# Patient Record
Sex: Male | Born: 1955 | Race: White | Hispanic: No | Smoking: Former smoker
Health system: Southern US, Community
[De-identification: ages and names within clinical notes are randomized; demographics above are authoritative.]

## PROBLEM LIST (undated history)

## (undated) DIAGNOSIS — I509 Heart failure, unspecified: Secondary | ICD-10-CM

## (undated) DIAGNOSIS — E119 Type 2 diabetes mellitus without complications: Secondary | ICD-10-CM

## (undated) DIAGNOSIS — J449 Chronic obstructive pulmonary disease, unspecified: Secondary | ICD-10-CM

## (undated) DIAGNOSIS — F329 Major depressive disorder, single episode, unspecified: Secondary | ICD-10-CM

## (undated) DIAGNOSIS — I2510 Atherosclerotic heart disease of native coronary artery without angina pectoris: Secondary | ICD-10-CM

## (undated) DIAGNOSIS — I10 Essential (primary) hypertension: Secondary | ICD-10-CM

## (undated) DIAGNOSIS — F32A Depression, unspecified: Secondary | ICD-10-CM

## (undated) DIAGNOSIS — E7800 Pure hypercholesterolemia, unspecified: Secondary | ICD-10-CM

## (undated) DIAGNOSIS — Z9289 Personal history of other medical treatment: Secondary | ICD-10-CM

## (undated) DIAGNOSIS — Z95811 Presence of heart assist device: Secondary | ICD-10-CM

## (undated) DIAGNOSIS — IMO0001 Reserved for inherently not codable concepts without codable children: Secondary | ICD-10-CM

## (undated) DIAGNOSIS — F419 Anxiety disorder, unspecified: Secondary | ICD-10-CM

## (undated) DIAGNOSIS — I5022 Chronic systolic (congestive) heart failure: Secondary | ICD-10-CM

## (undated) DIAGNOSIS — F1411 Cocaine abuse, in remission: Secondary | ICD-10-CM

## (undated) DIAGNOSIS — Z950 Presence of cardiac pacemaker: Secondary | ICD-10-CM

## (undated) DIAGNOSIS — Z720 Tobacco use: Secondary | ICD-10-CM

## (undated) DIAGNOSIS — K922 Gastrointestinal hemorrhage, unspecified: Secondary | ICD-10-CM

## (undated) DIAGNOSIS — Z95810 Presence of automatic (implantable) cardiac defibrillator: Secondary | ICD-10-CM

## (undated) DIAGNOSIS — R45851 Suicidal ideations: Secondary | ICD-10-CM

## (undated) HISTORY — DX: Heart failure, unspecified (CMS HCC): I50.9

## (undated) HISTORY — PX: HX HERNIA REPAIR: SHX51

## (undated) HISTORY — DX: Chronic obstructive pulmonary disease, unspecified (CMS HCC): J44.9

## (undated) HISTORY — DX: Type 2 diabetes mellitus without complications (CMS HCC): E11.9

## (undated) HISTORY — PX: HX CORONARY ARTERY BYPASS GRAFT: SHX141

## (undated) HISTORY — PX: HX BACK SURGERY: SHX140

## (undated) HISTORY — PX: HX VASECTOMY: SHX75

## (undated) HISTORY — PX: INSERT / REPLACE / REMOVE PACEMAKER: SUR710

## (undated) HISTORY — PX: BACK SURGERY: SHX140

## (undated) HISTORY — DX: Major depressive disorder, single episode, unspecified: F32.9

## (undated) HISTORY — DX: Gastrointestinal hemorrhage, unspecified: K92.2

## (undated) HISTORY — DX: Depression, unspecified: F32.A

## (undated) HISTORY — DX: Suicidal ideations: R45.851

## (undated) HISTORY — DX: Atherosclerotic heart disease of native coronary artery without angina pectoris: I25.10

## (undated) HISTORY — PX: CARDIAC DEFIBRILLATOR PLACEMENT: SHX171

## (undated) HISTORY — DX: Essential (primary) hypertension: I10

## (undated) HISTORY — DX: Tobacco use: Z72.0

## (undated) HISTORY — DX: Chronic obstructive pulmonary disease, unspecified: J44.9

## (undated) HISTORY — DX: Cocaine abuse, in remission: F14.11

## (undated) HISTORY — PX: VASECTOMY: SHX75

---

## 1998-03-13 ENCOUNTER — Observation Stay (HOSPITAL_COMMUNITY): Admission: RE | Admit: 1998-03-13 | Discharge: 1998-03-14 | Payer: Self-pay | Admitting: Specialist

## 1999-02-02 HISTORY — PX: LUMBAR DISC SURGERY: SHX700

## 1999-10-03 ENCOUNTER — Inpatient Hospital Stay (HOSPITAL_COMMUNITY): Admission: EM | Admit: 1999-10-03 | Discharge: 1999-10-12 | Payer: Self-pay | Admitting: Cardiothoracic Surgery

## 1999-10-04 ENCOUNTER — Encounter: Payer: Self-pay | Admitting: Cardiothoracic Surgery

## 1999-10-05 HISTORY — PX: CORONARY ARTERY BYPASS GRAFT: SHX141

## 1999-10-06 ENCOUNTER — Encounter: Payer: Self-pay | Admitting: Cardiothoracic Surgery

## 1999-10-07 ENCOUNTER — Encounter: Payer: Self-pay | Admitting: Cardiothoracic Surgery

## 1999-10-08 ENCOUNTER — Encounter: Payer: Self-pay | Admitting: Cardiothoracic Surgery

## 1999-10-09 ENCOUNTER — Encounter: Payer: Self-pay | Admitting: Cardiothoracic Surgery

## 1999-10-31 ENCOUNTER — Emergency Department (HOSPITAL_COMMUNITY): Admission: EM | Admit: 1999-10-31 | Discharge: 1999-10-31 | Payer: Self-pay | Admitting: Emergency Medicine

## 1999-11-06 ENCOUNTER — Encounter: Admission: RE | Admit: 1999-11-06 | Discharge: 1999-11-06 | Payer: Self-pay | Admitting: Cardiothoracic Surgery

## 1999-11-06 ENCOUNTER — Encounter: Payer: Self-pay | Admitting: Cardiothoracic Surgery

## 1999-11-11 ENCOUNTER — Inpatient Hospital Stay (HOSPITAL_COMMUNITY): Admission: AD | Admit: 1999-11-11 | Discharge: 1999-11-17 | Payer: Self-pay | Admitting: Cardiothoracic Surgery

## 2005-02-07 ENCOUNTER — Emergency Department: Payer: Self-pay | Admitting: Emergency Medicine

## 2005-02-08 ENCOUNTER — Inpatient Hospital Stay: Payer: Self-pay | Admitting: Anesthesiology

## 2006-06-08 ENCOUNTER — Ambulatory Visit: Payer: Self-pay | Admitting: Internal Medicine

## 2006-06-29 IMAGING — CR DG CHEST 1V PORT
1 series · 1 of 1 positions shown · non-contrast
Comparison: none

REASON FOR EXAM: Infection
COMMENTS:

[view not recorded]
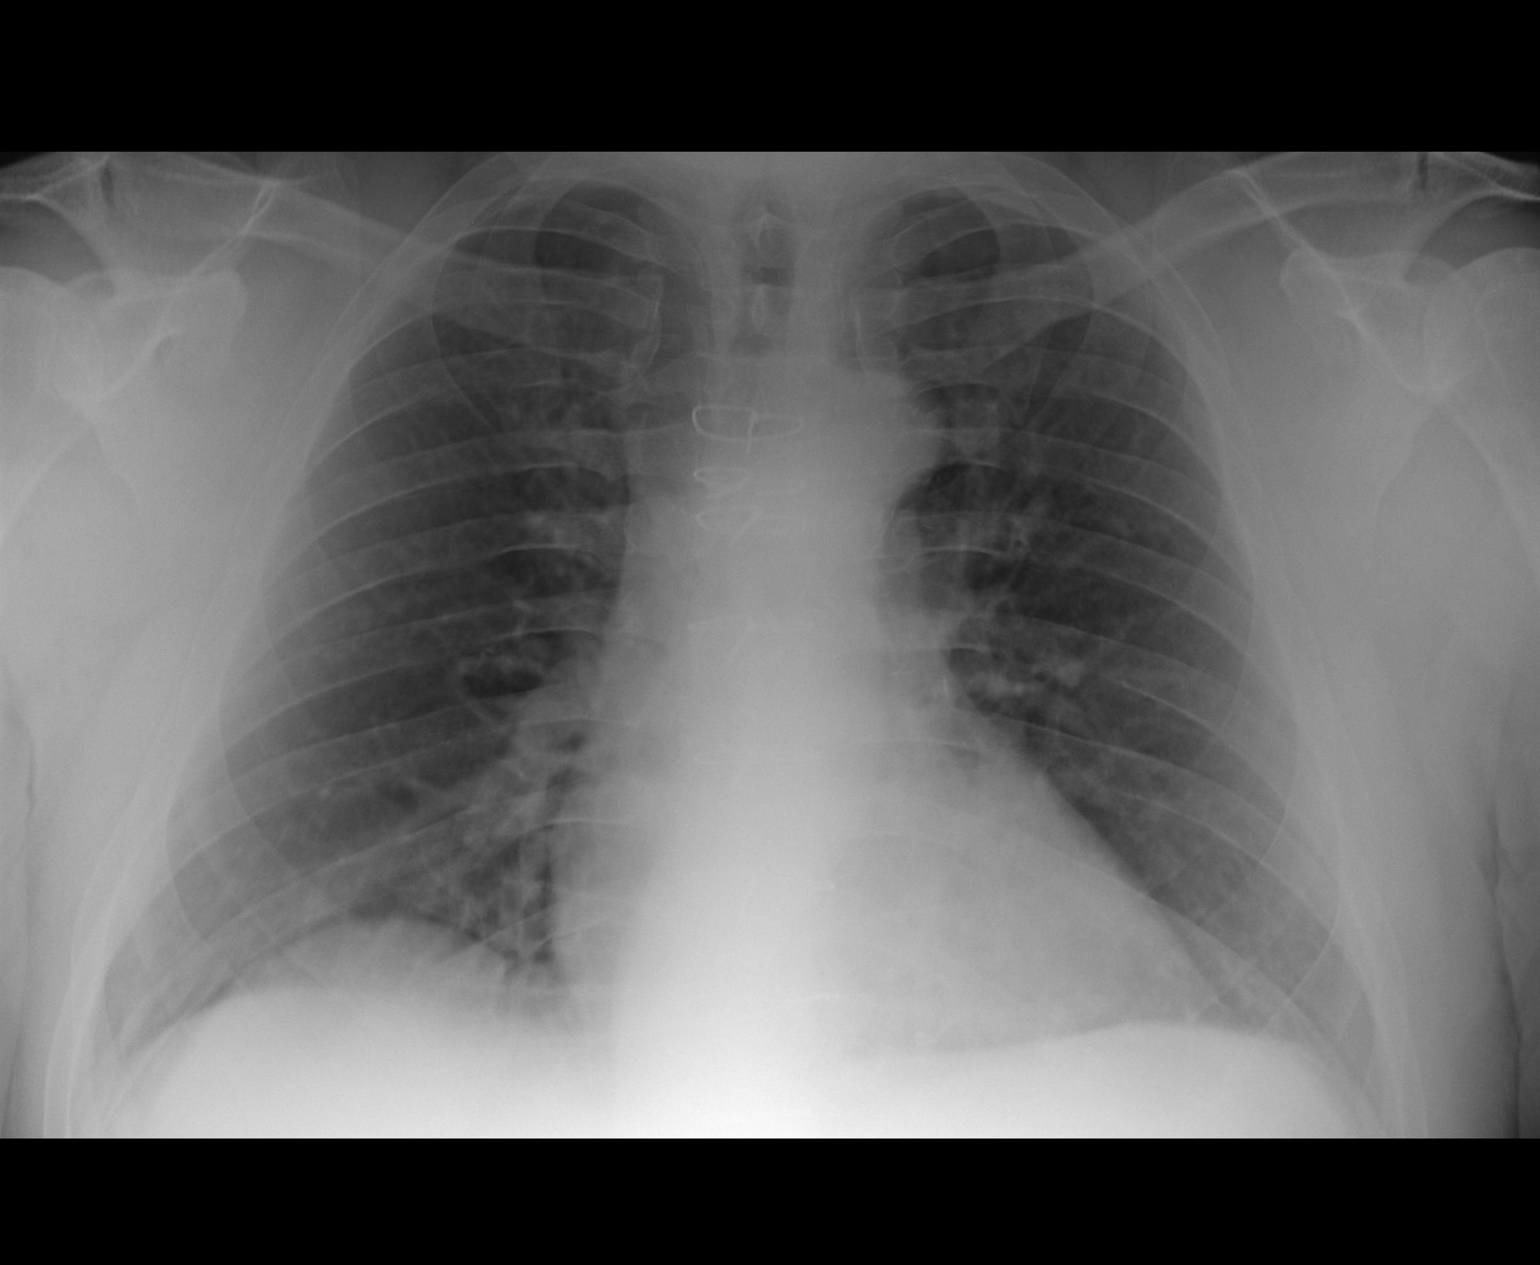

[1 of 1 positions shown; findings below may reference images not displayed]

PROCEDURE:     DXR - DXR PORTABLE CHEST SINGLE VIEW  - February 08, 2005  [DATE]

RESULT:     AP view of the chest is compared to a prior exam of 05/31/2000.

The lung fields remain clear. No pneumonia, pneumothorax or pleural effusion
is seen. The heart size is within normal limits. Post-operative changes of
prior CABG are again noted.
IMPRESSION: No acute changes are identified.

## 2006-06-29 IMAGING — US US EXTREM LOW VENOUS*R*
1 series · 17 of 24 positions shown · non-contrast
Comparison: none

REASON FOR EXAM: DVT
COMMENTS:

[Series 1: us extrem low venous*right* · 17 of 44 slices shown]
[im 1/44]
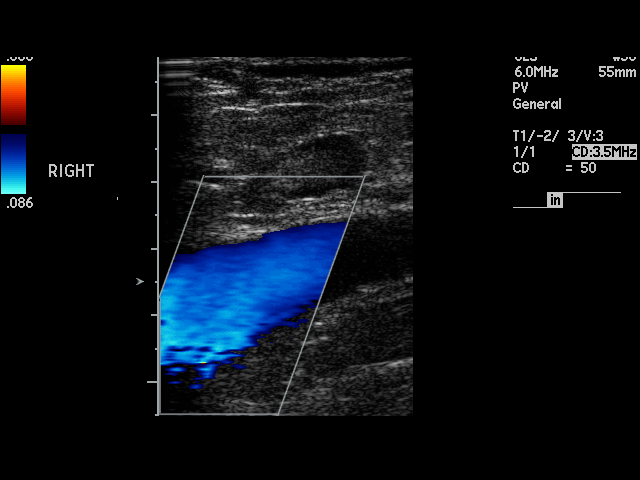
[im 4/44]
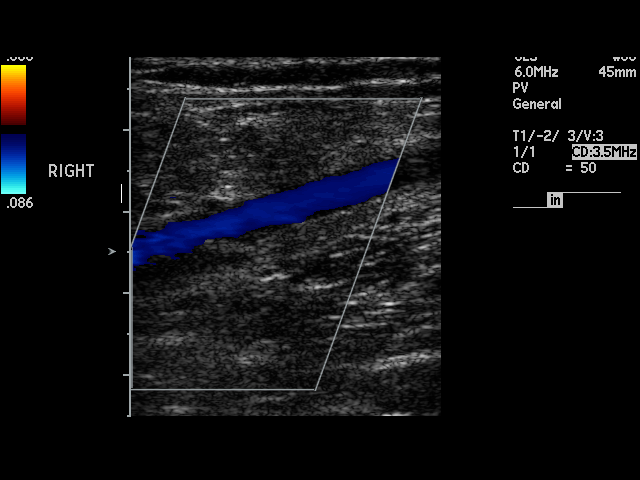
[im 6/44]
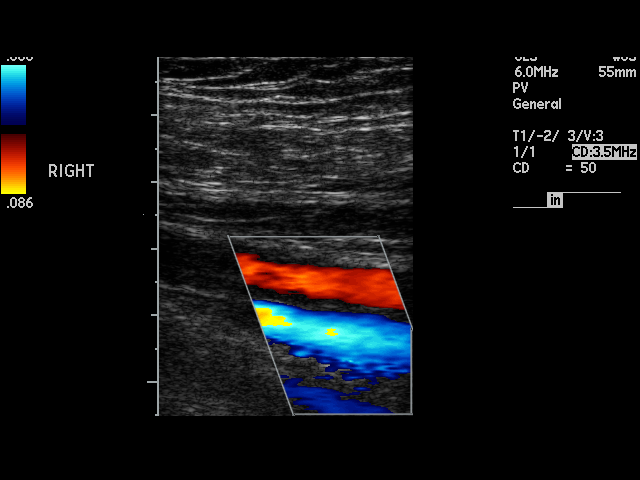
[im 8/44]
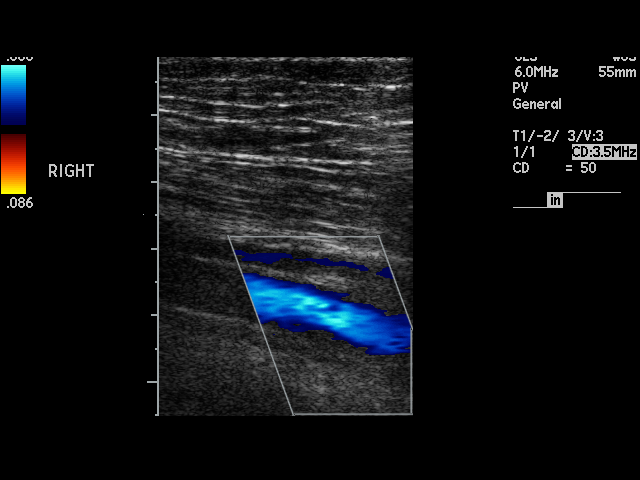
[im 12/44]
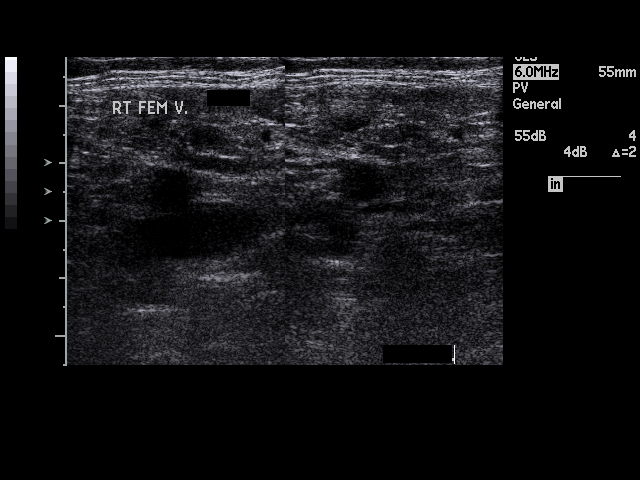
[im 14/44]
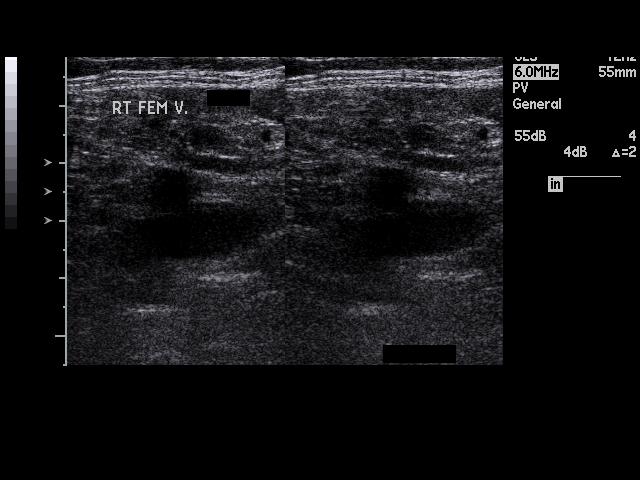
[im 17/44]
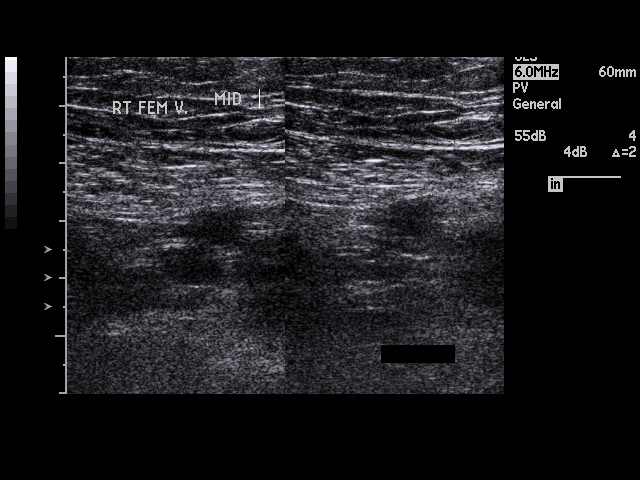
[im 19/44]
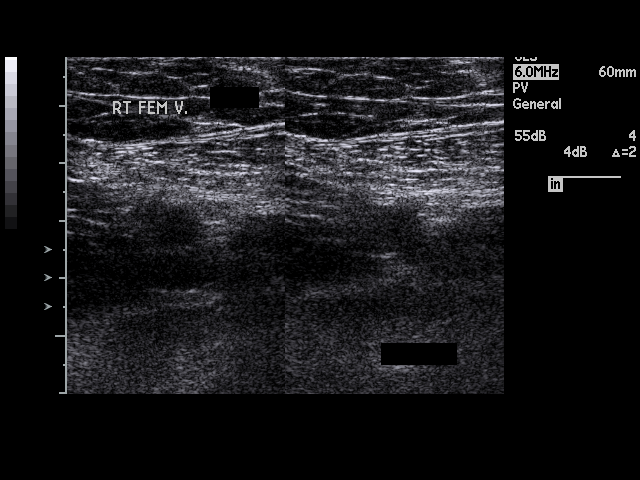
[im 23/44]
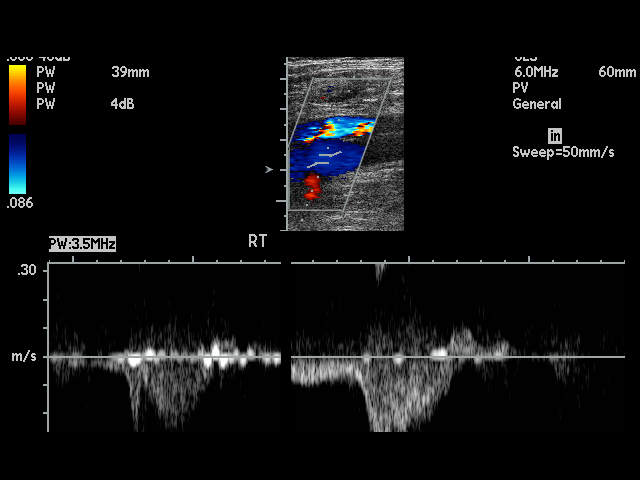
[im 25/44]
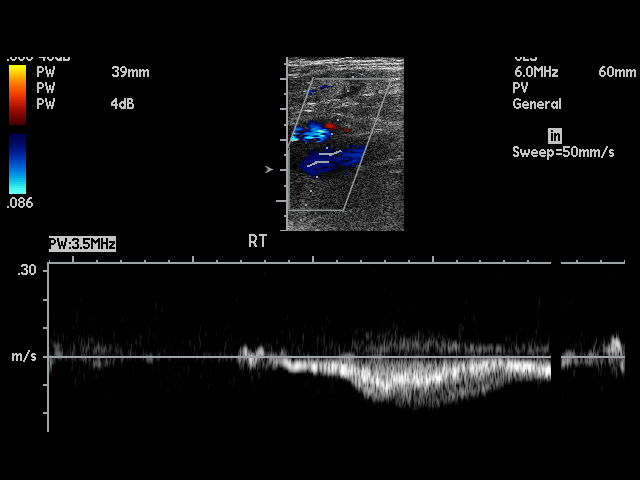
[im 27/44]
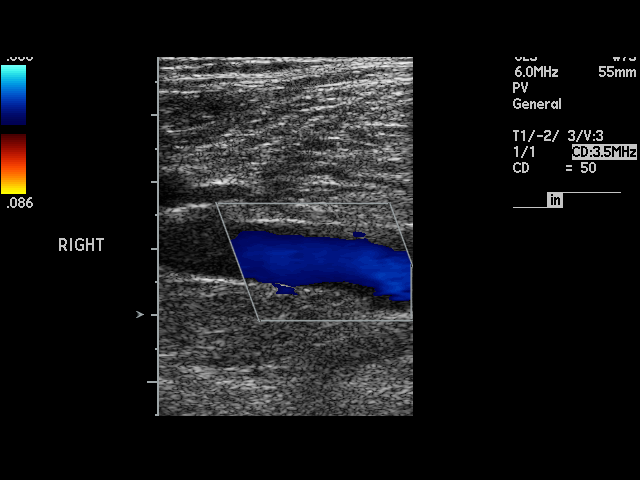
[im 30/44]
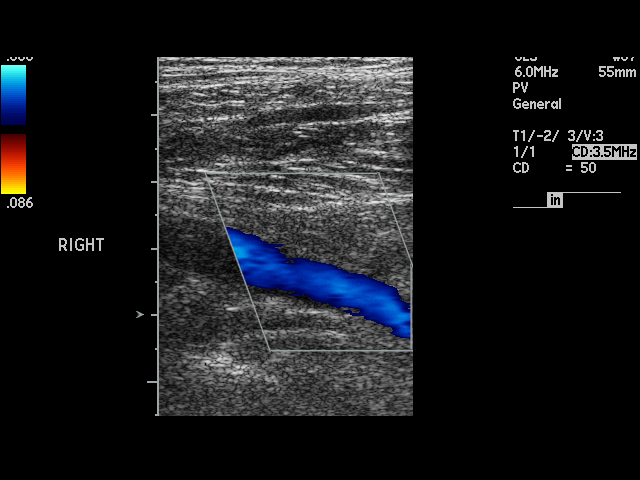
[im 32/44]
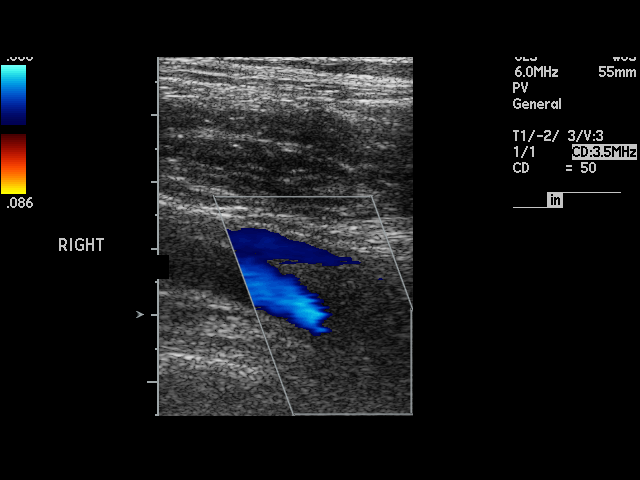
[im 36/44]
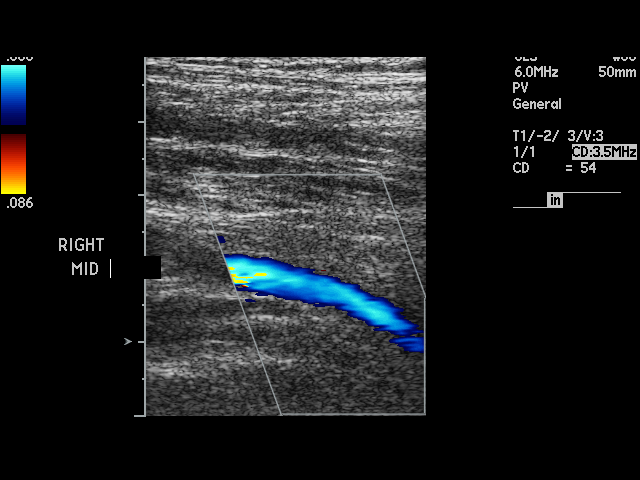
[im 38/44]
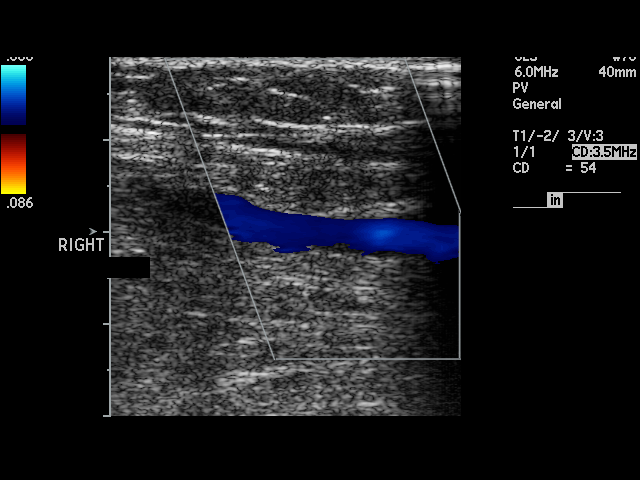
[im 40/44]
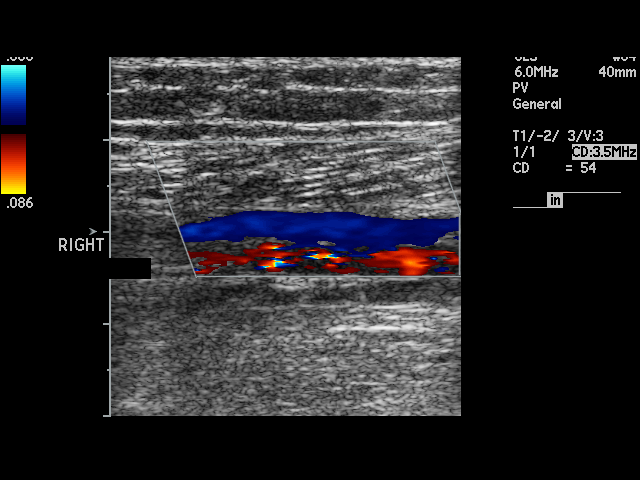
[im 44/44]
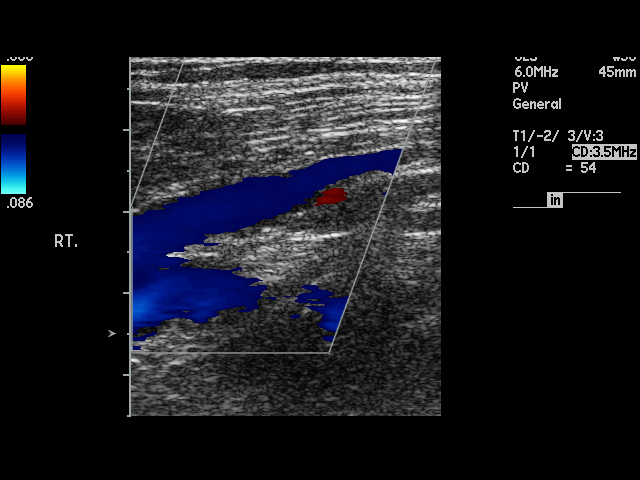

[17 of 24 positions shown; findings below may reference images not displayed]

PROCEDURE:     US  - US DOPPLER LOW EXTR RIGHT  - February 08, 2005  [DATE]

RESULT:     Color flow Doppler examination of the RIGHT femoral and
popliteal veins is performed.

The RIGHT femoral and popliteal veins are normally compressible. The
waveform patterns are normal and the color flow images are normal.
IMPRESSION: I see no evidence of thrombus within the RIGHT femoral or
popliteal veins.

## 2009-03-17 ENCOUNTER — Emergency Department: Payer: Self-pay | Admitting: Emergency Medicine

## 2009-06-04 ENCOUNTER — Inpatient Hospital Stay: Payer: Self-pay | Admitting: Internal Medicine

## 2009-06-05 ENCOUNTER — Inpatient Hospital Stay: Payer: Self-pay | Admitting: Specialist

## 2009-07-30 ENCOUNTER — Encounter (INDEPENDENT_AMBULATORY_CARE_PROVIDER_SITE_OTHER): Payer: Self-pay | Admitting: *Deleted

## 2009-07-30 ENCOUNTER — Encounter: Payer: Self-pay | Admitting: Cardiology

## 2009-07-30 LAB — CONVERTED CEMR LAB
ALT: 23 units/L
ALT: 23 units/L
AST: 16 units/L
Albumin: 4.4 g/dL
Alkaline Phosphatase: 62 units/L
BUN: 16 mg/dL
BUN: 16 mg/dL
CO2: 26 meq/L
Calcium: 9.3 mg/dL
Chloride: 101 meq/L
Chloride: 101 meq/L
Cholesterol: 179 mg/dL
Creatinine, Ser: 0.89 mg/dL
Creatinine, Ser: 0.89 mg/dL
Glucose, Bld: 263 mg/dL
Glucose, Bld: 263 mg/dL
HDL: 36 mg/dL
HDL: 36 mg/dL
Hgb A1c MFr Bld: 9.4 %
Hgb A1c MFr Bld: 9.4 %
LDL Cholesterol: 99 mg/dL
Potassium: 4.7 meq/L
Sodium: 139 meq/L
TSH: 1.166 microintl units/mL
TSH: 1.166 microintl units/mL
Total Protein: 7.3 g/dL
Triglycerides: 218 mg/dL
Triglycerides: 218 mg/dL

## 2009-08-06 ENCOUNTER — Encounter (INDEPENDENT_AMBULATORY_CARE_PROVIDER_SITE_OTHER): Payer: Self-pay | Admitting: *Deleted

## 2009-08-06 ENCOUNTER — Ambulatory Visit: Payer: Self-pay | Admitting: Cardiology

## 2009-08-06 DIAGNOSIS — M545 Low back pain, unspecified: Secondary | ICD-10-CM | POA: Insufficient documentation

## 2009-08-06 DIAGNOSIS — Z8659 Personal history of other mental and behavioral disorders: Secondary | ICD-10-CM

## 2009-08-06 DIAGNOSIS — I2510 Atherosclerotic heart disease of native coronary artery without angina pectoris: Secondary | ICD-10-CM | POA: Insufficient documentation

## 2009-08-06 DIAGNOSIS — Z9189 Other specified personal risk factors, not elsewhere classified: Secondary | ICD-10-CM | POA: Insufficient documentation

## 2009-08-06 DIAGNOSIS — E119 Type 2 diabetes mellitus without complications: Secondary | ICD-10-CM

## 2009-08-06 DIAGNOSIS — F17200 Nicotine dependence, unspecified, uncomplicated: Secondary | ICD-10-CM

## 2009-08-07 ENCOUNTER — Encounter: Payer: Self-pay | Admitting: Cardiology

## 2009-08-12 ENCOUNTER — Ambulatory Visit (HOSPITAL_COMMUNITY): Admission: RE | Admit: 2009-08-12 | Discharge: 2009-08-12 | Payer: Self-pay | Admitting: Cardiology

## 2009-08-12 ENCOUNTER — Ambulatory Visit: Payer: Self-pay | Admitting: Cardiology

## 2009-08-12 ENCOUNTER — Encounter: Payer: Self-pay | Admitting: Cardiology

## 2009-08-14 ENCOUNTER — Telehealth (INDEPENDENT_AMBULATORY_CARE_PROVIDER_SITE_OTHER): Payer: Self-pay

## 2009-08-15 ENCOUNTER — Telehealth (INDEPENDENT_AMBULATORY_CARE_PROVIDER_SITE_OTHER): Payer: Self-pay | Admitting: *Deleted

## 2009-10-09 ENCOUNTER — Encounter (INDEPENDENT_AMBULATORY_CARE_PROVIDER_SITE_OTHER): Payer: Self-pay | Admitting: *Deleted

## 2010-10-23 IMAGING — CR DG CHEST 1V PORT
1 series · 1 of 1 positions shown · non-contrast
Comparison: none

REASON FOR EXAM: cp
COMMENTS:

PROCEDURE:     DXR - DXR PORTABLE CHEST SINGLE VIEW  - June 04, 2009  [DATE]
RESULT:     The lungs are clear. The cardiac silhouette and visualized bony
skeleton are unremarkable.

[view not recorded]
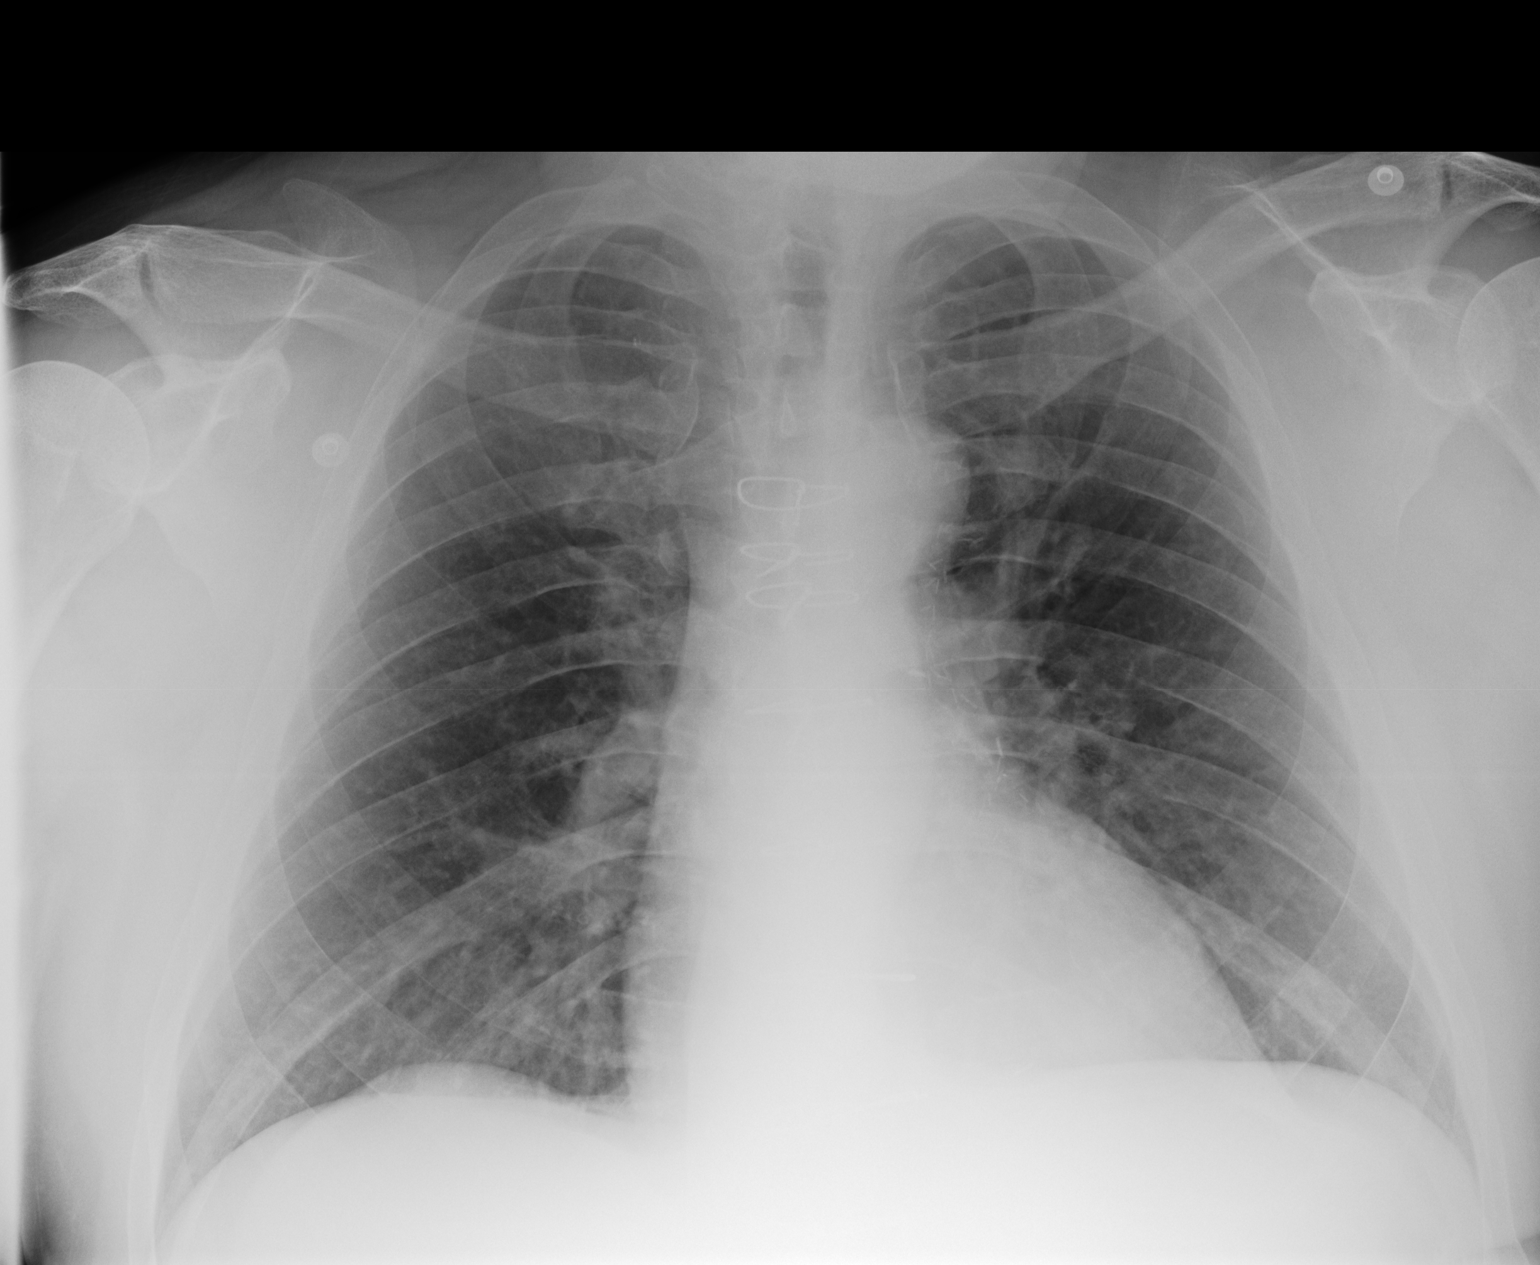

[1 of 1 positions shown; findings below may reference images not displayed]

IMPRESSION: 1. Chest radiograph without evidence of acute cardiopulmonary disease.
2. Patient status post median sternotomy coronary artery bypass grafting.

## 2010-11-03 NOTE — Miscellaneous (Signed)
Summary: LABS CMP,LIPIDS,TSH 07/30/2009  Clinical Lists Changes  Observations: Added new observation of CALCIUM: 9.3 mg/dL (16/07/9603 5:40) Added new observation of ALBUMIN: 4.4 g/dL (98/08/9146 8:29) Added new observation of PROTEIN, TOT: 7.3 g/dL (56/21/3086 5:78) Added new observation of SGPT (ALT): 23 units/L (07/30/2009 8:36) Added new observation of SGOT (AST): 16 units/L (07/30/2009 8:36) Added new observation of ALK PHOS: 62 units/L (07/30/2009 8:36) Added new observation of CREATININE: 0.89 mg/dL (46/96/2952 8:41) Added new observation of BUN: 16 mg/dL (32/44/0102 7:25) Added new observation of BG RANDOM: 263 mg/dL (36/64/4034 7:42) Added new observation of CO2 PLSM/SER: 26 meq/L (07/30/2009 8:36) Added new observation of CL SERUM: 101 meq/L (07/30/2009 8:36) Added new observation of K SERUM: 4.7 meq/L (07/30/2009 8:36) Added new observation of NA: 139 meq/L (07/30/2009 8:36) Added new observation of LDL: 99 mg/dL (59/56/3875 6:43) Added new observation of HDL: 36 mg/dL (32/95/1884 1:66) Added new observation of TRIGLYC TOT: 218 mg/dL (04/02/1600 0:93) Added new observation of CHOLESTEROL: 179 mg/dL (23/55/7322 0:25) Added new observation of TSH: 1.166 microintl units/mL (07/30/2009 8:36) Added new observation of HGBA1C: 9.4 % (07/30/2009 8:36)

## 2011-02-19 NOTE — H&P (Signed)
[place]. [facility]  Patient:    Ronald Miller, Ronald Miller                         MRN: [id number] Attending:  Mikey Bussing, M.D. Dictator:   Loura Pardon, P.A. CC:         Jillene Bucks. Drema Balzarine, M.D.             Arnoldo Hooker, M.D., Cardiology                         History and Physical  PRESENTING CIRCUMSTANCE: "My leg is infected."  HISTORY OF PRESENT ILLNESS:   This is a 55 year old male who underwent left heart catheterization 09/30/99 after experiencing three day history of chest pain, dyspnea, diaphoresis, and nausea.  Cardiac enzymes on admission to [facility] were elevated.  He subsequently underwent coronary artery bypass graft x 6 on 10/06/99 at [facility] by Dr. Kathlee Nations Trigt.  During his admission, his serum glucose was consistently elevated and he was covered with sliding scale insulin.  He was discharged 10/12/99 on Tenormin, Glucotrol XL, and  Cipro.  He has done well until about one and a half weeks ago.  At that time, he presented to the [facility] emergency room on a Saturday night complaining of swelling and tenderness at his right ankle saphenous venectomy harvest site.  He was seen the [facility] emergency room 10/31/99 by CVTS staff and started on p.o. Keflex.  Most recently, he presented to the ______ cardiovascular and thoracic surgeons in [place] on Friday, 11/07/99 and  saw Dr. Donata Clay, who instituted daily dressing changes at the right ankle, consisting of application of Neosporin and placement of sterile gauze.  His Keflex was continued.  Starting the very next day, Mr. Mazer felt swelling and tenderness just below the right knee at his saphenous venectomy incision there.  The pain nd swelling worsened with the beginning of this week such that he could hardly walk. He presented to the ______ cardiovascular and thoracic surgeons today.  He was seen there by Dr. Charlett Lango.   His incision just below the right knee as incised, purulent fluid was drained; this was cultured; the culture is positive for Staphylococcus aureus.  Mr. Stroschein is admitted now to [facility] for IV vancomycin therapy for Staphylococcus aureus positive abscess, as well as daily b.i.d. dressing changes.  ALLERGIES:  CODEINE.  MEDICATIONS: 1. Glucotrol XL 5 mg daily. 2. Enteric aspirin 325 mg daily. 3. Tenormin 25 mg daily. 4. Protonix 40 mg daily.  PAST MEDICAL HISTORY: 1. Atherosclerotic coronary artery disease. 2. History of herniated lumbar disc. 3. Type II diabetes mellitus. 4. Acute myocardial infarction 09/30/99.  PAST SURGICAL HISTORY: 1. Status post coronary artery bypass graft x 6, 10/06/99. 2. Status post back surgery for disc repair, 5/00. 3. Status post vasectomy/reversing vasectomy.  SOCIAL HISTORY:  He is divorced and has one daughter; he is in no close contact  with her.  He is a [employer].  He is a smoker, approximately one pack per day. He takes two to three beers a day.  FAMILY HISTORY:  He has two sisters and one brother, all in good health.  His mother is living, she is in good health.  Father is in good health. Grandfather  died of a myocardial infarction.  REVIEW OF SYSTEMS:  Pulmonary, cardiovascular  systems are stable per the patient. His neuro status is unremarkable and at baseline.  Abdomen:  He suffers from continued reflux and mild epigastric pain.  In the lower extremities, he complains of right lower extremity swelling and pain, which has increased in the last three days, making it very difficult to ambulate during this amount of time.  PHYSICAL EXAMINATION:  VITAL SIGNS:  Pending.  This is an obese gentleman in moderate distress from pain in the right lower extremity who underwent incision and drainage of abscess at the right knee earlier today.  His mental status is clear and he is able to answer  questions appropriately.  HEENT:  Eyes:  PERRL, EOMI.  Nares: Patent.  Sinuses clear.  Oropharynx:  He has native dentition in good repair.  Mucus membranes are pink and moist.  Neck:  Supple, no carotid bruits auscultated.  No jugular venous distention. No thyromegaly.  Chest:  Lungs clear to auscultation and percussion bilaterally.  Heart:  Regular rate and rhythm with a sinus rhythm on telemetry.  Abdomen:  Soft, obese, nondistended, bowel sounds are present.  He is not constipated.  He has no hepatosplenomegaly.  Extremities:  Right lower extremity:  Incision at the right ankle has broken down superficially with serous drainage.  At the right knee, there is erythema and swelling. The saphenous venectomy incision just below the knee has been incised  about 1/2 inch with serous drainage present.  The left lower extremity and the sternotomy incision are both healing well.  IMPRESSION: 1. Abscess, right lower extremity just below the knee, culture positive for  Staphylococcus aureus. 2. Status post coronary artery bypass graft x 6, 10/06/99. 3. History atherosclerotic coronary artery disease. 4. Type II diabetes mellitus. 5. History of acute myocardial infarction, 09/30/99.  PLAN:  Admit to [facility], place on IV vancomycin, start b.i.d. dressing changes to the abscess of the right knee, with daily dressing changes o the superficial wound at the right ankle.  Emphasize pain control, especially before dressing changes.    DD:  11/11/99 TD:  11/11/99 Job: [id number]

## 2011-02-19 NOTE — Op Note (Signed)
[place]. [facility]  Patient:    Ronald Miller                        MRN: [id number] Proc. Date: 10/06/99 Adm. Date:  [id number] Attending:  Mikey Bussing CC:         CVTS office             Arnoldo Hooker, M.D.             in [facility] in [place], [place].                           Operative Report  PREOPERATIVE DIAGNOSES: 1. Class IV unstable angina with recent non-Q-wave myocardial infarction. 2. Severe three vessel coronary artery disease.  POSTOPERATIVE DIAGNOSES: 1. Class IV unstable angina with recent non-Q-wave myocardial infarction. 2. Severe three vessel coronary artery disease.  OPERATION:  Coronary artery bypass grafting x 6 (left internal mammary artery to distal LAD, saphenous vein graft to first diagonal, saphenous vein graft to second diagonal, saphenous vein graft to obtuse marginal, sequential saphenous vein graft to right coronary artery and posterior descending).  SURGEON:  Mikey Bussing, M.D.  ASSISTANT:  Loura Pardon, P.A.  ANESTHESIA:  General by Dr. Cliffton Asters. Ivin Booty, M.D.  INDICATION:  The patient is a 55 year old diabetic smoker with obesity who presented with acute onset chest pain and ruled in for MI.  Cardiac catheterization by Dr. Arnoldo Hooker demonstrated severe three vessel disease with preserved left atrial function.  He was referred for coronary revascularization.  Prior to the operation, the patient was examined in his hospital room and is also the cardiac catheterization were reviewed with the patient.  The indications and expected benefits of the operation were discussed.  I reviewed the details of the operation including the placement of the surgical incisions, the use of cardiopulmonary bypass and general anesthesia, the choice of conduit, and the expected recovery.  I reviewed the risks of the operation including the risks of MI, CVA, bleeding, infection, and death.  I specifically  discussed the risks of  pulmonary complications due to his obesity and heavy smoking history.  He understood these implications for surgery and agreed to proceed with the operation as planned under informed consent.  FINDINGS:  The patients coronaries were diffusely diseased with multiple plaques in each vessel and were suboptimal target for grafting.  The saphenous vein was  below average in quality.  He would be a poor candidate for redo grafting.  The  mammary artery was placed very distally on the LAD due to his disease.  DESCRIPTION OF PROCEDURE:  The patient was brought to the operating room and placed supine on the operating table where general anesthesia was induced under invasive hemodynamic.  The chest, abdomen, and legs were prepped with Betadine and draped in a sterile field.  A median sternotomy was performed as the saphenous vein was harvested from the right lower extremity as well as the left lower extremity.  The internal mammary artery was harvested as a pedicle graft from its origin at the subclavian vessel and was a good vessel with excellent flow.  Heparin was administered systemically and the sternal retractor was placed.  The pericardium was opened and a purse-string was placed in the ascending aorta and right atrium.  The patient was cannulated and placed on cardiopulmonary bypass after the ECT was documented as  being therapeutic.  The coronaries were dissected out and the mammary artery and saphenous vein grafts were prepared for the distal anastomoses.  The patient was cooled to 20 degrees and the cardioplegia was placed.  Aortic cross clamp was applied.  One liter of cold blood cardioplegia was delivered to the aortic root with immediate cardioplegic  arrest and septal temperature dropping less than 12 degrees.  Topical ice saline slush used to augment myocardial preservation and a pericardial insulator pad was used to protect the left  phrenic nerve.  The distal coronary anastomoses were then performed.  The first distal anastomosis was to the first diagonal.  This was a diffusely diseased 1.4 mm vessel of proximal 95% stenosis.  The saphenous vein was sewn end-to-side with a running 7-0 Prolene. There was good flow through the graft.  The second distal anastomosis was the obtuse marginal.  This was a 1.5 mm vessel with diffuse disease and a proximal 90% stenosis.  A reverse saphenous vein was  sewn end-to-side with a running 7-0 Prolene.  The third distal anastomosis was to the second diagonal which was a 1.7 mm vessel, proximal 80% stenosis.  A reverse saphenous vein was sewn end-to-side with a running 7-0 Prolene with good flow through the graft.  The fourth and fifth distal anastomoses consistent of a sequential vein graft to the right coronary artery t the acute margin extending to the posterior descending of the inferior wall. The acute marginal right coronary was a 1.5 mm vessel with proximal tight 90% stenosis and a side-to-side anastomosis was performed using running 7-0 Prolene.  The fifth distal anastomosis was a continuation of the sequential vein graft to the posterior descending which had diffuse disease and a tight proximal 90% stenosis. The end of the vein was sewn end-to-side with a running 7-0 Prolene.  There was  good flow through the graft.  Cardioplegia was redosed.  The sixth distal anastomosis was to the distal LAD past a tight distal stenosis.  The LAD was 1.5 mm and the internal mammary artery pedicle was brought through an opening created in the left lateral pericardial. It was brought down on the LAD and sewn end-to-side with a running 8-0 Prolene. There was excellent flow through the anastomosis with immediate rise of septal temperature after release of the pedicle clamp of the mammary artery.  The mammary pedicle was secured to the epicardium.  The aortic cross clamp was  removed.  The heart was cardioverted back to a regular rhythm.  A partial occluding clamp was  placed on the ascending aorta and three proximal vein anastomoses were placed using 4.0 mm punch and running 6-0 Prolene.  The vein to the diagonal was fashioned as a wide configuration such that there was one proximal anastomosis for both distal  vein anastomoses.  All grafts were inspected and were hemostatic and there was ood flow through all the graft.  The patient was rewarmed to 37 degrees and temporary pacing wires were applied.  The patient reached 37 degrees.  He was weaned from cardiopulmonary bypass after the lungs re-expanded and then later resumed.  He was weaned off cardiopulmonary bypass easily without inotropic support with stable blood pressure and hemodynamics.  Protamine was administered and the cannula were removed.  The mediastinum was irrigated with warm antibiotic irrigation and the leg incisions  were irrigated and closed in a standard fashion.  The pericardium was loosely reapproximated.  Here two mediastinal and a left pleural chest tube were placed and brought out through  separate incisions.  The sternum was reapproximated with interrupted steel wire.  The pectoralis fascia and subcutaneous layers closed with running Vicryl.  The skin was closed with a subcuticular.  Sterile dressings were applied.  Total cardiopulmonary bypass time was 150 minutes with aortic cross clamp time f 75 minutes. DD:  10/06/99 TD:  10/07/99 Job: [id number]

## 2011-02-19 NOTE — Discharge Summary (Signed)
[place]. [facility]  Patient:    Ronald Miller                        MRN: [id number] Adm. Date:  [id number] Disc. Date: 10/12/99 Attending:  Mikey Bussing Dictator:   Lynnda Shields, P.A.-C. CC:         Mikey Bussing, M.D.             Lamar Blinks, M.D.             Lenard Galloway, M.D.                           Discharge Summary  ADMISSION DIAGNOSES: 1. Coronary artery disease, unstable angina pectoris. 2. Chronic pain syndrome. 3. History of herniated lumbar disk. 4. History of tobacco abuse. 5. Obesity.  PROCEDURES:  Coronary artery bypass grafting x 6, October 06, 1999.  HOSPITAL COURSE:  On October 03, 1999, the patient was admitted to [facility] with a chief complaint of "I had a heart attack."  This is a 55 year old male with no prior cardiac problems who presented to [facility] December 26 with a three-day history of off and on chest pain.  He was admitted to [facility] and was started on a heparin drip.  He underwent a left heart catheterization on December 27 which revealed severe three-vessel coronary artery disease.  He was transferred to [facility] for possible surgical correction of is coronary artery disease.  On October 03, 1999, the patient was seen by Loura Pardon, CVTS P.A., and Dr. Kerin Perna, attending physician.  Due to his new onset angina with non-Q-wave MI and catheterization revealing severe three-vessel coronary artery disease, surgical  intervention was recommended.  Surgical versus medical management of the problem was discussed with the patient, and the patient opted for surgery.  The surgery was scheduled for October 06, 1999.  The patient remained stable during the preoperative period.  On October 06, 1999, the patient was taken to the operating room by Dr. Donata Clay. A coronary artery bypass grafting was carried out.  Six vessels were bypassed. The LIMA was  anastomosed to the LAD; saphenous vein graft was anastomosed to the left circumflex; a sequential saphenous vein graft was anastomosed to the right coronary artery and the PDA, and another sequential saphenous vein graft was anastomosed  from the first diagonal to the second diagonal.  There were no intraoperative complications reported.  Cross clamp time was 77 minutes.  Pump time was 490 minutes.  The patient was transferred postoperatively to the surgical intensive care unit in stable condition.  On the evening of surgery, the patient was extubated and afebrile.  Cardiac index was good at 2.2.  Pulmonary artery pressure was good at 29/19.  Chest tube output was low.  Urine output was good.  Hematocrit was 33.  Potassium was 3.9.  On postoperative day #1, the cardiac index was greater than 3.  EKG showed right bundle branch block without sequelae.  O2 saturation was 94% on 2 liters a minute O2.  Labs were as follows.  White blood cell count 13,000, hematocrit 35%, platelet count 149,000.  Potassium 4.9, creatinine 1.0, glucose 271.  The patient was in  stable condition, but it was determined to keep him in the surgical intensive care unit for continued pulmonary care.  On postoperative day #2, the patient was  in normal sinus rhythm with a blood pressure of 110/70.  O2 saturations were 92% on 2 liters a minute by nasal cannula. Chest x-ray showed mild pulmonary edema.  Labs were as follows.  Hematocrit 31%, white blood cell count 11,400, platelet count 130,000.  Creatinine 0.7.  on postoperative day #3, the patients temperature went up to 100.2.  His vital signs were otherwise stable.  He white blood count was 10,700.  His hemoglobin nd hematocrit were 11.6 and 32.7%, respectively.  Platelet count was 171,000. Serum electrolyte studies showed sodium 135, potassium 4.6, chloride 97, CO2 33, BUN 5, creatinine 0.9, glucose 138.  The patient was having a lot of difficulty  with pulmonary toilet due to incisional pain.  Despite ______  and assistance from both physician assistants, nursing staff, and physicians, we could not get the patient to cough adequately or use incentive spirometer.  He developed some green sputum. His pain medicines were increased, and he was encouraged to aggressively institute coughing and deep breathing as well as use his incentive spirometer q.2h.  Over the next several days, the patient progressed fairly.  He continued to be difficult to motivate to ambulate and use adequate pulmonary toilet measures.  Despite this, the patient did quite well.  On October 11, 1999, the patient continued to have a little bit of fever which is undoubtedly due to his decreased mobility and atelectasis.  He was started on Humibid on October 10, 1999, and this seemed to increased the amount of productivity of his cough while decreasing the amount of effort required to loosen the respiratory secretions.  He vital signs were otherwise stable.   He did have some coarse rales in both bases of his lungs, but they were much improved from the previous day.  His incisions were stable.  Again, it was emphasized to the patient the importance of adequate pulmonary toilet utilization.  Dr. Dorris Fetch examined the patient also and determined that he would probably be stable for discharge he following day.  DISCHARGE DIAGNOSES: 1. Severe three vessel coronary artery disease, status post coronary artery bypass    grafting x 6. 2. Chronic pain syndrome. 3. History of herniated lumbar disks. 4. History of tobacco abuse. 5. Moderate to severe postoperative atelectasis. 6. Bronchitis. 7. Obesity.  DISCHARGE MEDICATIONS: 1. Albuterol MDI 4 puffs q.i.d. 2. Humibid LA 600 mg 2 p.o. q.12h. 3. OxyContin 20 mg 1 p.o. b.i.d. 4. Cipro 500 mg 1 q.12h. x 5 additional days. 5. Protonix 40 mg 1 q.d. 6. Tenormin 25 mg 1/2 tablet p.o. b.i.d. 7. Glucotrol XL 5  mg 1 p.o. q.d. 8. Enteric-coated ASA 325 mg 1 p.o. q.d.   DISCHARGE AND FOLLOWUP INSTRUCTIONS:  All medications, activity level, diet, and wound care will be discussed in detail with the patient before discharge.  His medications and their dosing will be reviewed with him in detail.  1) The patient is being advised to see Dr. Arnoldo Hooker in followup in two weeks.  He has been advised he will need to call to arrange this appointment.  2) The patient will eed to see Dr. Donata Clay in three weeks.  Our office will call him with the date and time. 3) The patient has an appointment at the [facility] diabetes management center on November 04, 1999, at 8:45 a.m.  The patient is discharged home in stable condition. DD:  10/11/99 TD:  10/11/99 Job: [id number]

## 2011-09-29 ENCOUNTER — Encounter: Payer: Self-pay | Admitting: Cardiology

## 2012-09-06 ENCOUNTER — Inpatient Hospital Stay: Payer: Self-pay | Admitting: Internal Medicine

## 2012-09-06 LAB — URINALYSIS, COMPLETE
Bacteria: NONE SEEN
Bilirubin,UR: NEGATIVE
Blood: NEGATIVE
Protein: 100
RBC,UR: 1 /HPF (ref 0–5)
Specific Gravity: 1.031 (ref 1.003–1.030)
Squamous Epithelial: 1
WBC UR: 3 /HPF (ref 0–5)

## 2012-09-06 LAB — COMPREHENSIVE METABOLIC PANEL
Alkaline Phosphatase: 64 U/L (ref 50–136)
Anion Gap: 8 (ref 7–16)
Bilirubin,Total: 0.8 mg/dL (ref 0.2–1.0)
Calcium, Total: 8.6 mg/dL (ref 8.5–10.1)
Chloride: 104 mmol/L (ref 98–107)
Co2: 25 mmol/L (ref 21–32)
Creatinine: 0.82 mg/dL (ref 0.60–1.30)
EGFR (African American): >60
EGFR (Non-African Amer.): >60
SGOT(AST): 34 U/L (ref 15–37)
SGPT (ALT): 69 U/L (ref 12–78)

## 2012-09-06 LAB — TROPONIN I
Troponin-I: 0.53 ng/mL — ABNORMAL HIGH
Troponin-I: 0.64 ng/mL — ABNORMAL HIGH

## 2012-09-06 LAB — APTT: Activated PTT: 30.9 secs (ref 23.6–35.9)

## 2012-09-06 LAB — CBC
MCHC: 35.1 g/dL (ref 32.0–36.0)
MCV: 91 fL (ref 80–100)
Platelet: 141 10*3/uL — ABNORMAL LOW (ref 150–440)
RDW: 13.9 % (ref 11.5–14.5)
WBC: 8.8 10*3/uL (ref 3.8–10.6)

## 2012-09-06 LAB — DRUG SCREEN, URINE
Amphetamines, Ur Screen: NEGATIVE (ref ?–1000)
Benzodiazepine, Ur Scrn: NEGATIVE (ref ?–200)
MDMA (Ecstasy)Ur Screen: NEGATIVE (ref ?–500)
Methadone, Ur Screen: NEGATIVE (ref ?–300)
Tricyclic, Ur Screen: NEGATIVE (ref ?–1000)

## 2012-09-06 LAB — CK TOTAL AND CKMB (NOT AT ARMC): CK, Total: 252 U/L — ABNORMAL HIGH (ref 35–232)

## 2012-09-06 LAB — PROTIME-INR
INR: 1
Prothrombin Time: 13.7 secs (ref 11.5–14.7)

## 2012-09-06 LAB — ACETAMINOPHEN LEVEL: Acetaminophen: <2 ug/mL

## 2012-09-06 LAB — ETHANOL: Ethanol %: <0.003 % (ref 0.000–0.080)

## 2012-09-07 LAB — CBC WITH DIFFERENTIAL/PLATELET
Basophil %: 0.4 %
HCT: 39.5 % — ABNORMAL LOW (ref 40.0–52.0)
HGB: 13.4 g/dL (ref 13.0–18.0)
Lymphocyte #: 2.1 10*3/uL (ref 1.0–3.6)
MCH: 31 pg (ref 26.0–34.0)
MCHC: 34 g/dL (ref 32.0–36.0)
MCV: 91 fL (ref 80–100)
Monocyte #: 0.4 x10 3/mm (ref 0.2–1.0)
Monocyte %: 8 %
Neutrophil #: 2.6 10*3/uL (ref 1.4–6.5)
Neutrophil %: 49.6 %
WBC: 5.2 10*3/uL (ref 3.8–10.6)

## 2012-09-07 LAB — BASIC METABOLIC PANEL
Anion Gap: 5 — ABNORMAL LOW (ref 7–16)
Chloride: 103 mmol/L (ref 98–107)
EGFR (Non-African Amer.): >60
Osmolality: 284 (ref 275–301)
Sodium: 135 mmol/L — ABNORMAL LOW (ref 136–145)

## 2012-09-07 LAB — DRUG SCREEN, URINE
Barbiturates, Ur Screen: NEGATIVE (ref ?–200)
Cocaine Metabolite,Ur ~~LOC~~: POSITIVE (ref ?–300)
MDMA (Ecstasy)Ur Screen: NEGATIVE (ref ?–500)
Opiate, Ur Screen: NEGATIVE (ref ?–300)
Phencyclidine (PCP) Ur S: NEGATIVE (ref ?–25)
Tricyclic, Ur Screen: NEGATIVE (ref ?–1000)

## 2012-09-07 LAB — HEMOGLOBIN A1C: Hemoglobin A1C: 8.4 % — ABNORMAL HIGH (ref 4.2–6.3)

## 2012-09-07 LAB — LIPID PANEL
Cholesterol: 124 mg/dL (ref 0–200)
HDL Cholesterol: 23 mg/dL — ABNORMAL LOW (ref 40–60)
Ldl Cholesterol, Calc: 50 mg/dL (ref 0–100)
Triglycerides: 253 mg/dL — ABNORMAL HIGH (ref 0–200)
VLDL Cholesterol, Calc: 51 mg/dL — ABNORMAL HIGH (ref 5–40)

## 2012-09-07 LAB — TROPONIN I: Troponin-I: 0.42 ng/mL — ABNORMAL HIGH

## 2012-09-08 LAB — CK TOTAL AND CKMB (NOT AT ARMC)
CK, Total: 79 U/L (ref 35–232)
CK-MB: 2.2 ng/mL (ref 0.5–3.6)

## 2012-09-08 LAB — TROPONIN I: Troponin-I: 0.27 ng/mL — ABNORMAL HIGH

## 2012-12-25 ENCOUNTER — Emergency Department: Payer: Self-pay | Admitting: Unknown Physician Specialty

## 2013-04-06 ENCOUNTER — Emergency Department: Payer: Self-pay | Admitting: Emergency Medicine

## 2013-04-08 ENCOUNTER — Emergency Department: Payer: Self-pay | Admitting: Emergency Medicine

## 2013-05-22 ENCOUNTER — Emergency Department: Payer: Self-pay | Admitting: Internal Medicine

## 2013-08-14 ENCOUNTER — Inpatient Hospital Stay: Payer: Self-pay | Admitting: Internal Medicine

## 2013-08-14 DIAGNOSIS — I059 Rheumatic mitral valve disease, unspecified: Secondary | ICD-10-CM

## 2013-08-14 LAB — URINALYSIS, COMPLETE
Bilirubin,UR: NEGATIVE
Ketone: NEGATIVE
Nitrite: NEGATIVE
RBC,UR: <1 /HPF (ref 0–5)
Specific Gravity: 1.012 (ref 1.003–1.030)
WBC UR: 1 /HPF (ref 0–5)

## 2013-08-14 LAB — CBC
HCT: 43.9 % (ref 40.0–52.0)
HGB: 14.9 g/dL (ref 13.0–18.0)
MCH: 30.6 pg (ref 26.0–34.0)
MCHC: 34.1 g/dL (ref 32.0–36.0)
Platelet: 146 10*3/uL — ABNORMAL LOW (ref 150–440)
RDW: 13.8 % (ref 11.5–14.5)
WBC: 8.1 10*3/uL (ref 3.8–10.6)

## 2013-08-14 LAB — COMPREHENSIVE METABOLIC PANEL
Albumin: 3.7 g/dL (ref 3.4–5.0)
Alkaline Phosphatase: 87 U/L (ref 50–136)
Anion Gap: 4 — ABNORMAL LOW (ref 7–16)
BUN: 11 mg/dL (ref 7–18)
Bilirubin,Total: 0.5 mg/dL (ref 0.2–1.0)
Calcium, Total: 8.7 mg/dL (ref 8.5–10.1)
Creatinine: 0.86 mg/dL (ref 0.60–1.30)
EGFR (Non-African Amer.): >60
Glucose: 285 mg/dL — ABNORMAL HIGH (ref 65–99)
Potassium: 4.1 mmol/L (ref 3.5–5.1)
SGOT(AST): 23 U/L (ref 15–37)
Sodium: 137 mmol/L (ref 136–145)
Total Protein: 7.1 g/dL (ref 6.4–8.2)

## 2013-08-14 LAB — TROPONIN I
Troponin-I: <0.02 ng/mL
Troponin-I: <0.02 ng/mL
Troponin-I: <0.02 ng/mL

## 2013-08-14 LAB — DRUG SCREEN, URINE
Amphetamines, Ur Screen: NEGATIVE (ref ?–1000)
Benzodiazepine, Ur Scrn: NEGATIVE (ref ?–200)
Cannabinoid 50 Ng, Ur ~~LOC~~: NEGATIVE (ref ?–50)
Cocaine Metabolite,Ur ~~LOC~~: POSITIVE (ref ?–300)
Methadone, Ur Screen: NEGATIVE (ref ?–300)
Opiate, Ur Screen: NEGATIVE (ref ?–300)
Tricyclic, Ur Screen: NEGATIVE (ref ?–1000)

## 2013-08-14 LAB — CK-MB
CK-MB: 2.7 ng/mL (ref 0.5–3.6)
CK-MB: 2.5 ng/mL (ref 0.5–3.6)

## 2013-08-15 LAB — COMPREHENSIVE METABOLIC PANEL
Albumin: 3.5 g/dL (ref 3.4–5.0)
Bilirubin,Total: 0.4 mg/dL (ref 0.2–1.0)
Chloride: 98 mmol/L (ref 98–107)
Co2: 30 mmol/L (ref 21–32)
Creatinine: 1.07 mg/dL (ref 0.60–1.30)
EGFR (Non-African Amer.): >60
Glucose: 311 mg/dL — ABNORMAL HIGH (ref 65–99)
Osmolality: 283 (ref 275–301)
Potassium: 3.6 mmol/L (ref 3.5–5.1)
SGPT (ALT): 42 U/L (ref 12–78)
Sodium: 135 mmol/L — ABNORMAL LOW (ref 136–145)

## 2013-08-15 LAB — CBC WITH DIFFERENTIAL/PLATELET
Basophil #: 0 10*3/uL (ref 0.0–0.1)
Eosinophil #: 0.1 10*3/uL (ref 0.0–0.7)
HCT: 42.9 % (ref 40.0–52.0)
HGB: 14.8 g/dL (ref 13.0–18.0)
Lymphocyte #: 1.9 10*3/uL (ref 1.0–3.6)
Lymphocyte %: 19.6 %
MCHC: 34.5 g/dL (ref 32.0–36.0)
MCV: 89 fL (ref 80–100)
Monocyte %: 4.4 %
Neutrophil #: 7.4 10*3/uL — ABNORMAL HIGH (ref 1.4–6.5)
Neutrophil %: 75.2 %
Platelet: 157 10*3/uL (ref 150–440)
RBC: 4.82 10*6/uL (ref 4.40–5.90)
RDW: 13.3 % (ref 11.5–14.5)
WBC: 9.8 10*3/uL (ref 3.8–10.6)

## 2013-08-15 LAB — LIPID PANEL
Cholesterol: 153 mg/dL (ref 0–200)
Ldl Cholesterol, Calc: 89 mg/dL (ref 0–100)
VLDL Cholesterol, Calc: 27 mg/dL (ref 5–40)

## 2013-08-15 LAB — PRO B NATRIURETIC PEPTIDE: B-Type Natriuretic Peptide: 2528 pg/mL — ABNORMAL HIGH (ref 0–125)

## 2013-08-15 LAB — TSH: Thyroid Stimulating Horm: 0.454 u[IU]/mL

## 2013-08-15 LAB — HEMOGLOBIN A1C: Hemoglobin A1C: 10.8 % — ABNORMAL HIGH (ref 4.2–6.3)

## 2014-01-25 ENCOUNTER — Emergency Department: Payer: Self-pay | Admitting: Emergency Medicine

## 2014-01-25 LAB — CBC
HCT: 45.3 % (ref 40.0–52.0)
HGB: 14.8 g/dL (ref 13.0–18.0)
MCH: 30.3 pg (ref 26.0–34.0)
MCHC: 32.8 g/dL (ref 32.0–36.0)
MCV: 93 fL (ref 80–100)
Platelet: 130 10*3/uL — ABNORMAL LOW (ref 150–440)
RBC: 4.89 10*6/uL (ref 4.40–5.90)
RDW: 13.5 % (ref 11.5–14.5)
WBC: 8.1 10*3/uL (ref 3.8–10.6)

## 2014-01-25 LAB — BASIC METABOLIC PANEL
Anion Gap: 2 — ABNORMAL LOW (ref 7–16)
BUN: 14 mg/dL (ref 7–18)
Calcium, Total: 9.1 mg/dL (ref 8.5–10.1)
Chloride: 103 mmol/L (ref 98–107)
Co2: 31 mmol/L (ref 21–32)
Creatinine: 1.02 mg/dL (ref 0.60–1.30)
EGFR (African American): >60
EGFR (Non-African Amer.): >60
Glucose: 229 mg/dL — ABNORMAL HIGH (ref 65–99)
Osmolality: 280 (ref 275–301)
Potassium: 4.5 mmol/L (ref 3.5–5.1)
SODIUM: 136 mmol/L (ref 136–145)

## 2014-01-25 LAB — TROPONIN I: Troponin-I: <0.02 ng/mL

## 2014-01-25 LAB — PRO B NATRIURETIC PEPTIDE: B-Type Natriuretic Peptide: 2121 pg/mL — ABNORMAL HIGH (ref 0–125)

## 2014-03-05 DIAGNOSIS — I2510 Atherosclerotic heart disease of native coronary artery without angina pectoris: Secondary | ICD-10-CM | POA: Insufficient documentation

## 2014-03-05 DIAGNOSIS — IMO0001 Reserved for inherently not codable concepts without codable children: Secondary | ICD-10-CM | POA: Insufficient documentation

## 2014-03-05 DIAGNOSIS — Z794 Long term (current) use of insulin: Secondary | ICD-10-CM

## 2014-03-05 DIAGNOSIS — E119 Type 2 diabetes mellitus without complications: Secondary | ICD-10-CM

## 2014-03-05 DIAGNOSIS — E785 Hyperlipidemia, unspecified: Secondary | ICD-10-CM | POA: Insufficient documentation

## 2014-03-26 ENCOUNTER — Emergency Department: Payer: Self-pay | Admitting: Emergency Medicine

## 2014-03-26 LAB — DRUG SCREEN, URINE
AMPHETAMINES, UR SCREEN: NEGATIVE (ref ?–1000)
Barbiturates, Ur Screen: NEGATIVE (ref ?–200)
Benzodiazepine, Ur Scrn: NEGATIVE (ref ?–200)
COCAINE METABOLITE, UR ~~LOC~~: POSITIVE (ref ?–300)
Cannabinoid 50 Ng, Ur ~~LOC~~: NEGATIVE (ref ?–50)
MDMA (ECSTASY) UR SCREEN: NEGATIVE (ref ?–500)
Methadone, Ur Screen: NEGATIVE (ref ?–300)
OPIATE, UR SCREEN: NEGATIVE (ref ?–300)
Phencyclidine (PCP) Ur S: NEGATIVE (ref ?–25)
Tricyclic, Ur Screen: NEGATIVE (ref ?–1000)

## 2014-03-26 LAB — CBC
HCT: 42.4 % (ref 40.0–52.0)
HGB: 13.8 g/dL (ref 13.0–18.0)
MCH: 29.8 pg (ref 26.0–34.0)
MCHC: 32.5 g/dL (ref 32.0–36.0)
MCV: 92 fL (ref 80–100)
PLATELETS: 135 10*3/uL — AB (ref 150–440)
RBC: 4.62 10*6/uL (ref 4.40–5.90)
RDW: 13.8 % (ref 11.5–14.5)
WBC: 9.8 10*3/uL (ref 3.8–10.6)

## 2014-03-26 LAB — TROPONIN I
Troponin-I: <0.02 ng/mL
Troponin-I: <0.02 ng/mL

## 2014-03-26 LAB — COMPREHENSIVE METABOLIC PANEL
ALK PHOS: 65 U/L
AST: 39 U/L — AB (ref 15–37)
Albumin: 3.6 g/dL (ref 3.4–5.0)
Anion Gap: 7 (ref 7–16)
BUN: 26 mg/dL — ABNORMAL HIGH (ref 7–18)
Bilirubin,Total: 0.3 mg/dL (ref 0.2–1.0)
CREATININE: 0.92 mg/dL (ref 0.60–1.30)
Calcium, Total: 8.7 mg/dL (ref 8.5–10.1)
Chloride: 104 mmol/L (ref 98–107)
Co2: 28 mmol/L (ref 21–32)
EGFR (Non-African Amer.): >60
GLUCOSE: 128 mg/dL — AB (ref 65–99)
OSMOLALITY: 284 (ref 275–301)
Potassium: 4.2 mmol/L (ref 3.5–5.1)
SGPT (ALT): 49 U/L (ref 12–78)
Sodium: 139 mmol/L (ref 136–145)
Total Protein: 7 g/dL (ref 6.4–8.2)

## 2014-03-26 LAB — URINALYSIS, COMPLETE
BACTERIA: NONE SEEN
BLOOD: NEGATIVE
Bilirubin,UR: NEGATIVE
Glucose,UR: NEGATIVE mg/dL (ref 0–75)
Leukocyte Esterase: NEGATIVE
Nitrite: NEGATIVE
Ph: 5 (ref 4.5–8.0)
Protein: NEGATIVE
Specific Gravity: 1.026 (ref 1.003–1.030)
Squamous Epithelial: <1
WBC UR: <1 /HPF (ref 0–5)

## 2014-03-26 LAB — CK TOTAL AND CKMB (NOT AT ARMC)
CK, TOTAL: 132 U/L
CK-MB: 2.6 ng/mL (ref 0.5–3.6)

## 2014-03-26 LAB — PRO B NATRIURETIC PEPTIDE: B-Type Natriuretic Peptide: 2161 pg/mL — ABNORMAL HIGH (ref 0–125)

## 2014-03-29 ENCOUNTER — Observation Stay: Payer: Self-pay | Admitting: Emergency Medicine

## 2014-03-29 LAB — TROPONIN I

## 2014-03-29 LAB — DRUG SCREEN, URINE
Amphetamines, Ur Screen: NEGATIVE (ref ?–1000)
Barbiturates, Ur Screen: NEGATIVE (ref ?–200)
Benzodiazepine, Ur Scrn: NEGATIVE (ref ?–200)
Cannabinoid 50 Ng, Ur ~~LOC~~: NEGATIVE (ref ?–50)
Cocaine Metabolite,Ur ~~LOC~~: NEGATIVE (ref ?–300)
MDMA (ECSTASY) UR SCREEN: NEGATIVE (ref ?–500)
Methadone, Ur Screen: NEGATIVE (ref ?–300)
OPIATE, UR SCREEN: NEGATIVE (ref ?–300)
PHENCYCLIDINE (PCP) UR S: NEGATIVE (ref ?–25)
TRICYCLIC, UR SCREEN: NEGATIVE (ref ?–1000)

## 2014-03-29 LAB — CBC
HCT: 40.9 % (ref 40.0–52.0)
HGB: 13.4 g/dL (ref 13.0–18.0)
MCH: 30 pg (ref 26.0–34.0)
MCHC: 32.7 g/dL (ref 32.0–36.0)
MCV: 92 fL (ref 80–100)
Platelet: 130 10*3/uL — ABNORMAL LOW (ref 150–440)
RBC: 4.47 10*6/uL (ref 4.40–5.90)
RDW: 13.9 % (ref 11.5–14.5)
WBC: 8.2 10*3/uL (ref 3.8–10.6)

## 2014-03-29 LAB — CK TOTAL AND CKMB (NOT AT ARMC)
CK, Total: 107 U/L
CK-MB: 2.8 ng/mL (ref 0.5–3.6)

## 2014-03-29 LAB — COMPREHENSIVE METABOLIC PANEL
ALBUMIN: 3.9 g/dL (ref 3.4–5.0)
AST: 24 U/L (ref 15–37)
Alkaline Phosphatase: 82 U/L
Anion Gap: 6 — ABNORMAL LOW (ref 7–16)
BUN: 18 mg/dL (ref 7–18)
Bilirubin,Total: 0.4 mg/dL (ref 0.2–1.0)
CALCIUM: 8.6 mg/dL (ref 8.5–10.1)
Chloride: 105 mmol/L (ref 98–107)
Co2: 28 mmol/L (ref 21–32)
Creatinine: 1.05 mg/dL (ref 0.60–1.30)
EGFR (African American): >60
EGFR (Non-African Amer.): >60
Glucose: 237 mg/dL — ABNORMAL HIGH (ref 65–99)
Osmolality: 287 (ref 275–301)
Potassium: 3.7 mmol/L (ref 3.5–5.1)
SGPT (ALT): 40 U/L (ref 12–78)
Sodium: 139 mmol/L (ref 136–145)
Total Protein: 7.3 g/dL (ref 6.4–8.2)

## 2014-03-29 LAB — PRO B NATRIURETIC PEPTIDE: B-Type Natriuretic Peptide: 2216 pg/mL — ABNORMAL HIGH (ref 0–125)

## 2014-03-30 LAB — CK TOTAL AND CKMB (NOT AT ARMC)
CK, TOTAL: 97 U/L
CK, TOTAL: 89 U/L
CK-MB: 2.5 ng/mL (ref 0.5–3.6)
CK-MB: 2.7 ng/mL (ref 0.5–3.6)

## 2014-03-30 LAB — CBC WITH DIFFERENTIAL/PLATELET
BASOS PCT: 0.5 %
Basophil #: 0 10*3/uL (ref 0.0–0.1)
Eosinophil #: 0.1 10*3/uL (ref 0.0–0.7)
Eosinophil %: 1.7 %
HCT: 42.2 % (ref 40.0–52.0)
HGB: 13.9 g/dL (ref 13.0–18.0)
Lymphocyte #: 2.2 10*3/uL (ref 1.0–3.6)
Lymphocyte %: 25.4 %
MCH: 30.1 pg (ref 26.0–34.0)
MCHC: 32.9 g/dL (ref 32.0–36.0)
MCV: 92 fL (ref 80–100)
MONO ABS: 0.6 x10 3/mm (ref 0.2–1.0)
Monocyte %: 6.7 %
Neutrophil #: 5.6 10*3/uL (ref 1.4–6.5)
Neutrophil %: 65.7 %
PLATELETS: 137 10*3/uL — AB (ref 150–440)
RBC: 4.61 10*6/uL (ref 4.40–5.90)
RDW: 14.1 % (ref 11.5–14.5)
WBC: 8.5 10*3/uL (ref 3.8–10.6)

## 2014-03-30 LAB — BASIC METABOLIC PANEL
ANION GAP: 8 (ref 7–16)
BUN: 17 mg/dL (ref 7–18)
CO2: 29 mmol/L (ref 21–32)
CREATININE: 1.06 mg/dL (ref 0.60–1.30)
Calcium, Total: 8.5 mg/dL (ref 8.5–10.1)
Chloride: 103 mmol/L (ref 98–107)
EGFR (African American): >60
Glucose: 126 mg/dL — ABNORMAL HIGH (ref 65–99)
Osmolality: 282 (ref 275–301)
POTASSIUM: 3.7 mmol/L (ref 3.5–5.1)
Sodium: 140 mmol/L (ref 136–145)

## 2014-03-30 LAB — MAGNESIUM: Magnesium: 1.9 mg/dL

## 2014-03-30 LAB — TROPONIN I
Troponin-I: <0.02 ng/mL
Troponin-I: <0.02 ng/mL

## 2014-04-08 ENCOUNTER — Emergency Department: Payer: Self-pay | Admitting: Emergency Medicine

## 2014-04-08 LAB — DRUG SCREEN, URINE
AMPHETAMINES, UR SCREEN: NEGATIVE (ref ?–1000)
Barbiturates, Ur Screen: NEGATIVE (ref ?–200)
Benzodiazepine, Ur Scrn: NEGATIVE (ref ?–200)
Cannabinoid 50 Ng, Ur ~~LOC~~: NEGATIVE (ref ?–50)
Cocaine Metabolite,Ur ~~LOC~~: POSITIVE (ref ?–300)
MDMA (Ecstasy)Ur Screen: NEGATIVE (ref ?–500)
METHADONE, UR SCREEN: NEGATIVE (ref ?–300)
Opiate, Ur Screen: NEGATIVE (ref ?–300)
Phencyclidine (PCP) Ur S: NEGATIVE (ref ?–25)
TRICYCLIC, UR SCREEN: NEGATIVE (ref ?–1000)

## 2014-04-08 LAB — PRO B NATRIURETIC PEPTIDE: B-TYPE NATIURETIC PEPTID: 2042 pg/mL — AB (ref 0–125)

## 2014-04-08 LAB — BASIC METABOLIC PANEL
Anion Gap: 7 (ref 7–16)
BUN: 12 mg/dL (ref 7–18)
CALCIUM: 8.2 mg/dL — AB (ref 8.5–10.1)
CO2: 26 mmol/L (ref 21–32)
Chloride: 102 mmol/L (ref 98–107)
Creatinine: 1.05 mg/dL (ref 0.60–1.30)
EGFR (African American): >60
EGFR (Non-African Amer.): >60
Glucose: 311 mg/dL — ABNORMAL HIGH (ref 65–99)
Osmolality: 282 (ref 275–301)
Potassium: 4.5 mmol/L (ref 3.5–5.1)
SODIUM: 135 mmol/L — AB (ref 136–145)

## 2014-04-08 LAB — CBC
HCT: 41.1 % (ref 40.0–52.0)
HGB: 13.9 g/dL (ref 13.0–18.0)
MCH: 31.1 pg (ref 26.0–34.0)
MCHC: 33.9 g/dL (ref 32.0–36.0)
MCV: 92 fL (ref 80–100)
PLATELETS: 148 10*3/uL — AB (ref 150–440)
RBC: 4.47 10*6/uL (ref 4.40–5.90)
RDW: 13.8 % (ref 11.5–14.5)
WBC: 8.8 10*3/uL (ref 3.8–10.6)

## 2014-04-08 LAB — TROPONIN I: Troponin-I: <0.02 ng/mL

## 2014-04-09 ENCOUNTER — Emergency Department: Payer: Self-pay | Admitting: Emergency Medicine

## 2014-04-09 LAB — URINALYSIS, COMPLETE
BILIRUBIN, UR: NEGATIVE
Bacteria: NONE SEEN
Blood: NEGATIVE
Glucose,UR: NEGATIVE mg/dL (ref 0–75)
Ketone: NEGATIVE
LEUKOCYTE ESTERASE: NEGATIVE
NITRITE: NEGATIVE
Ph: 5 (ref 4.5–8.0)
Protein: 30
RBC,UR: 1 /HPF (ref 0–5)
SQUAMOUS EPITHELIAL: NONE SEEN
Specific Gravity: 1.026 (ref 1.003–1.030)

## 2014-04-09 LAB — BASIC METABOLIC PANEL
Anion Gap: 4 — ABNORMAL LOW (ref 7–16)
BUN: 12 mg/dL (ref 7–18)
CHLORIDE: 100 mmol/L (ref 98–107)
CO2: 32 mmol/L (ref 21–32)
Calcium, Total: 8.9 mg/dL (ref 8.5–10.1)
Creatinine: 1.07 mg/dL (ref 0.60–1.30)
EGFR (African American): >60
GLUCOSE: 191 mg/dL — AB (ref 65–99)
OSMOLALITY: 277 (ref 275–301)
Potassium: 3.9 mmol/L (ref 3.5–5.1)
Sodium: 136 mmol/L (ref 136–145)

## 2014-04-09 LAB — CBC
HCT: 41.2 % (ref 40.0–52.0)
HGB: 13.6 g/dL (ref 13.0–18.0)
MCH: 30.1 pg (ref 26.0–34.0)
MCHC: 32.9 g/dL (ref 32.0–36.0)
MCV: 92 fL (ref 80–100)
Platelet: 162 10*3/uL (ref 150–440)
RBC: 4.5 10*6/uL (ref 4.40–5.90)
RDW: 14.4 % (ref 11.5–14.5)
WBC: 9.6 10*3/uL (ref 3.8–10.6)

## 2014-04-09 LAB — TROPONIN I

## 2014-04-09 LAB — PRO B NATRIURETIC PEPTIDE: B-TYPE NATIURETIC PEPTID: 2558 pg/mL — AB (ref 0–125)

## 2014-04-09 LAB — PROTIME-INR
INR: 1.1
PROTHROMBIN TIME: 13.8 s (ref 11.5–14.7)

## 2014-05-09 ENCOUNTER — Ambulatory Visit: Payer: Self-pay | Admitting: Family

## 2014-05-16 IMAGING — CT CT ORBITS WITHOUT CONTRAST
1 series · 16 of 30 positions shown, 20 images · non-contrast
Comparison: none

REASON FOR EXAM: STRUCK IN LEFT EYE, ANISICORIA
COMMENTS:   May transport without cardiac monitor

PROCEDURE:     CT  - CT ORBITS OR TEMPORAL BONE WO  - December 26, 2012  [DATE]
RESULT:
TECHNIQUE: Multiplanar imaging of the bones of the face was obtained
utilizing helical 3 mm acquisition and bone reconstruction algorithm.

[Series 2: facial 3.0 h60f · axial · 0.33mm/px · z∈[-238,-132]mm · 16 of 39 slices shown, 20 images]
[im 2/39  brain]
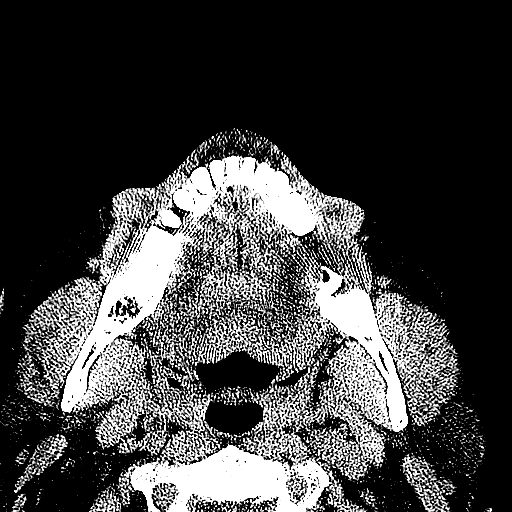
[im 2/39  bone]
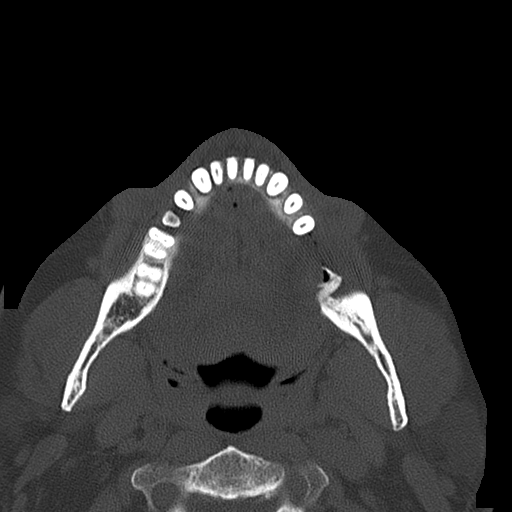
[im 4/39  bone]
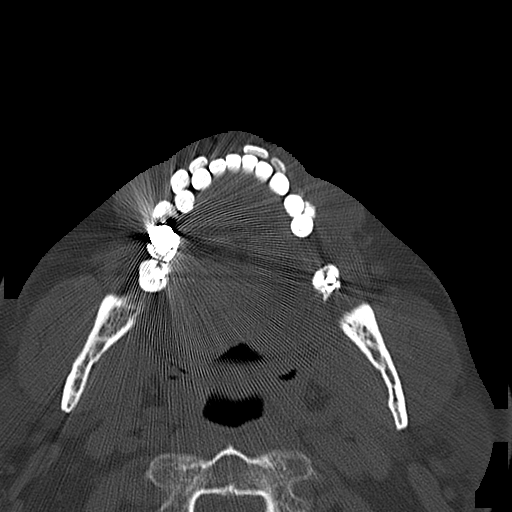
[im 7/39  bone]
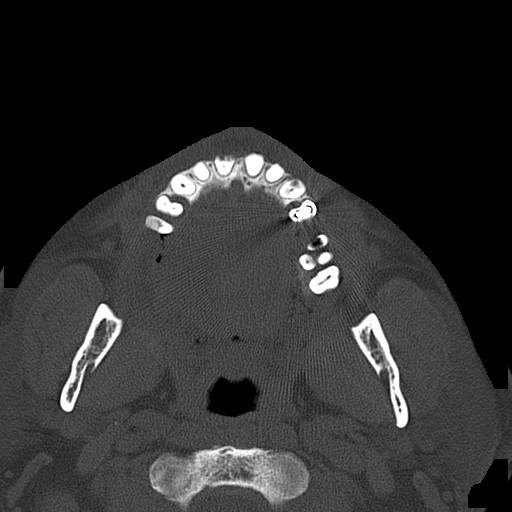
[im 10/39  bone]
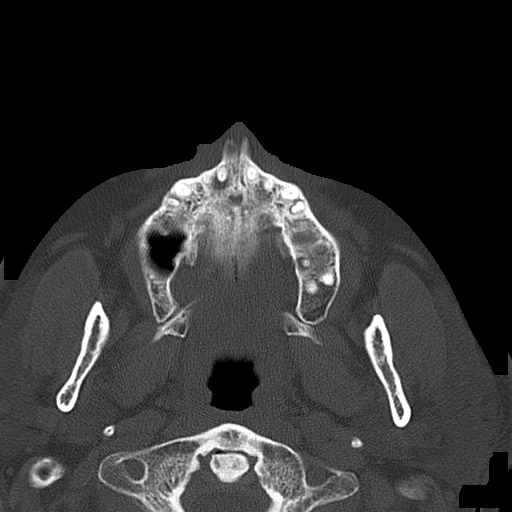
[im 11/39  brain]
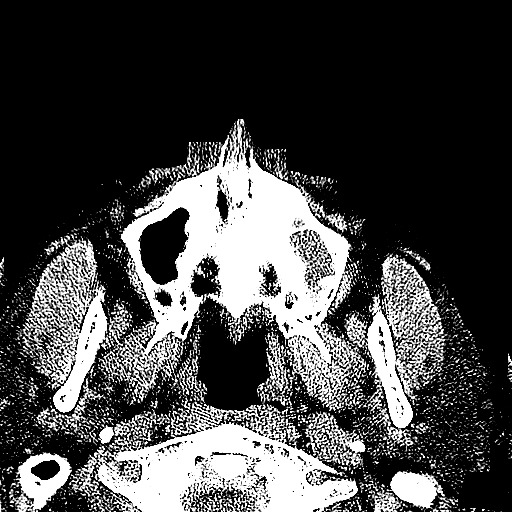
[im 11/39  bone]
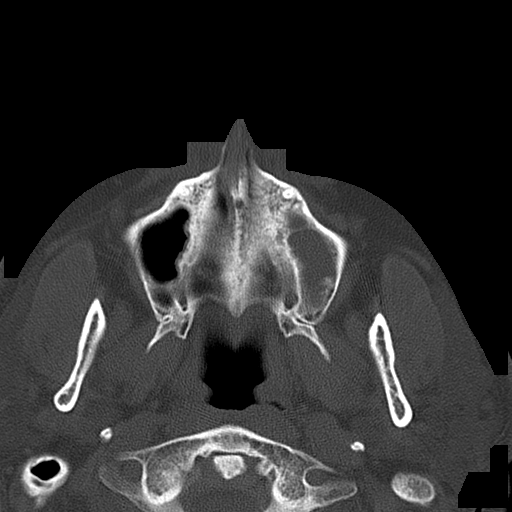
[im 14/39  bone]
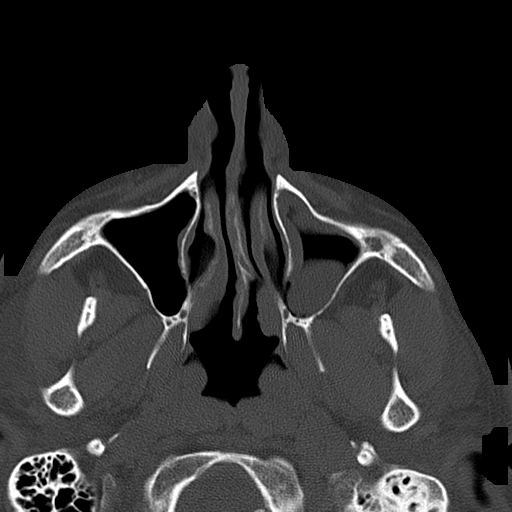
[im 16/39  bone]
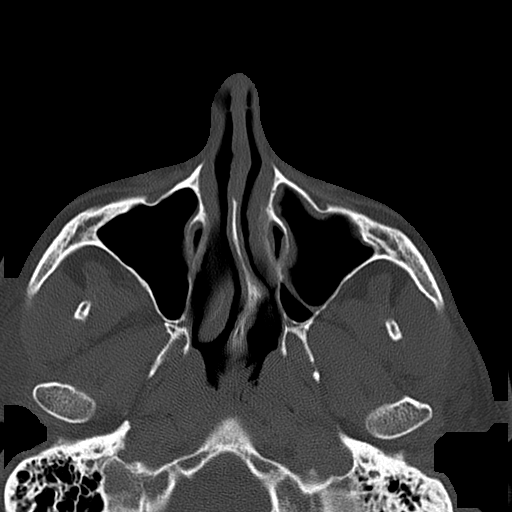
[im 19/39  bone]
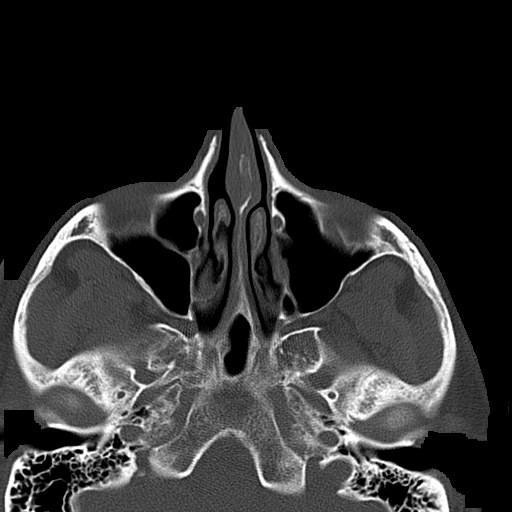
[im 20/39  brain]
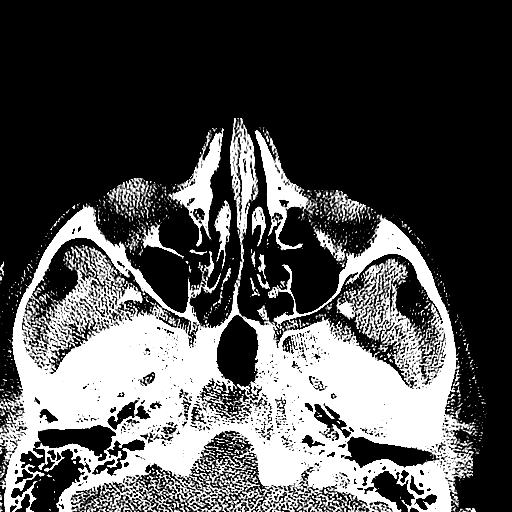
[im 20/39  bone]
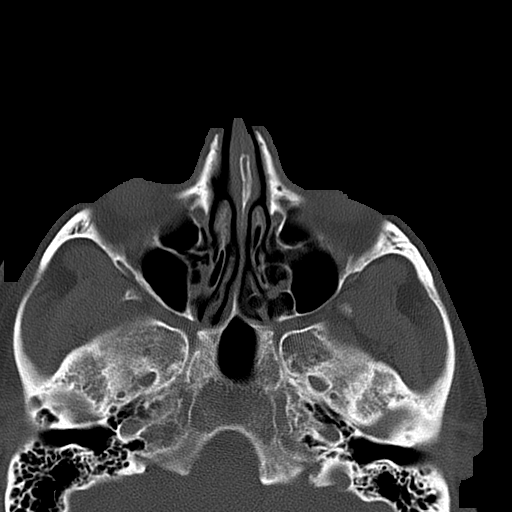
[im 23/39  bone]
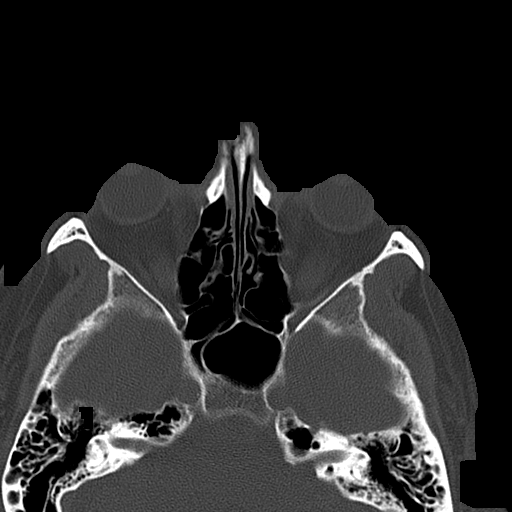
[im 25/39  bone]
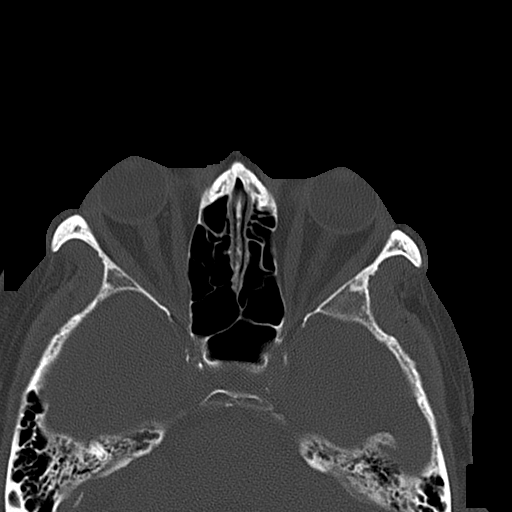
[im 28/39  bone]
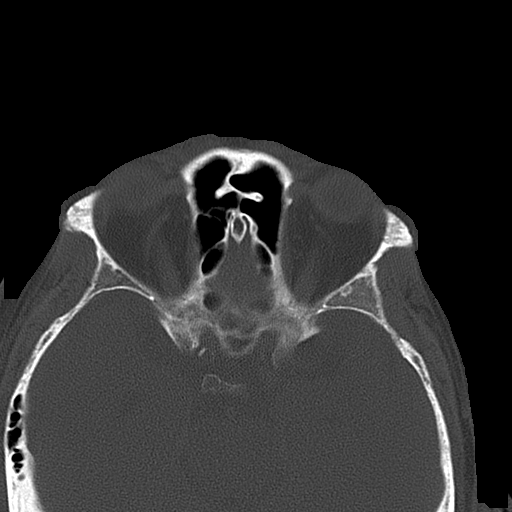
[im 29/39  brain]
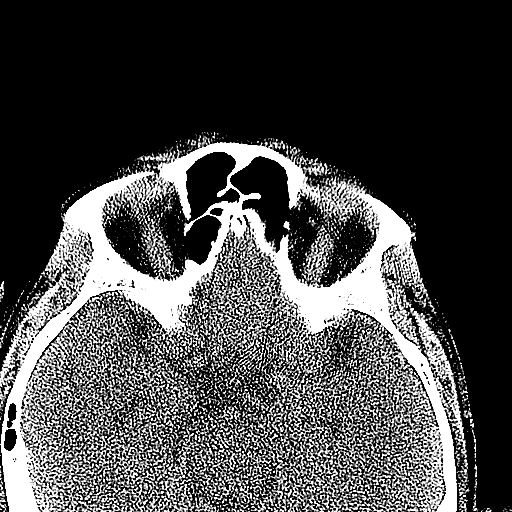
[im 29/39  bone]
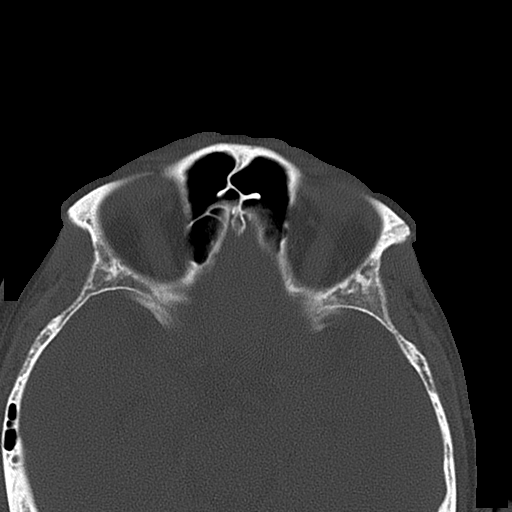
[im 32/39  bone]
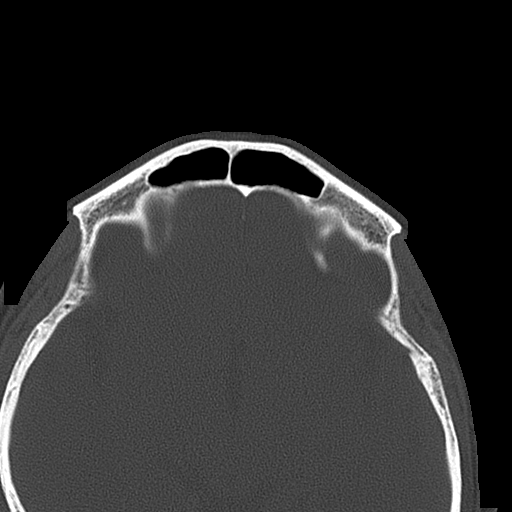
[im 35/39  bone]
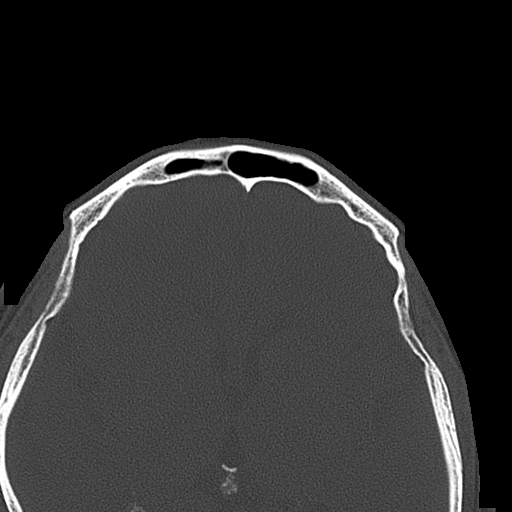
[im 37/39  bone]
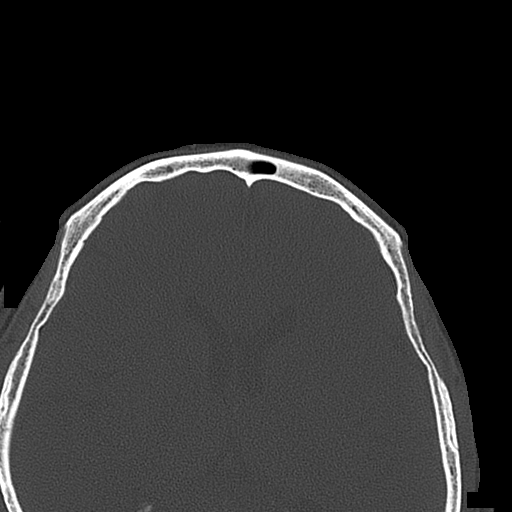

[16 of 30 positions shown; findings below may reference images not displayed]

FINDINGS: The osseous structures demonstrate no evidence of acute fracture.
The bony orbits, globes and extraocular structures are unremarkable. There
is mild sinus mucosal thickening within the left maxillary sinus likely
representing a sinus retention cyst versus polyp. There is poor dentition
with multiple dental cares. There are no significant abnormalities.
IMPRESSION: 1.  No evidence of acute osseous abnormalities.
2.  Mild sinus disease within the left maxillary sinus.
3.  Dr. Pb of the Emergency Department was informed of these findings via
a preliminary faxed report.

## 2014-08-25 IMAGING — CR DG SHOULDER 3+V*R*
1 series · 3 of 3 positions shown · non-contrast
Comparison: none

REASON FOR EXAM: fall, shoulder pain
COMMENTS:

PROCEDURE:     DXR - DXR SHOULDER RIGHT COMPLETE  - April 06, 2013  [DATE]
RESULT:     Comparison: None.

[Series 1: w shoulder internal right · 0.14mm/px · 3 of 3 slices shown]
[im 1/3]
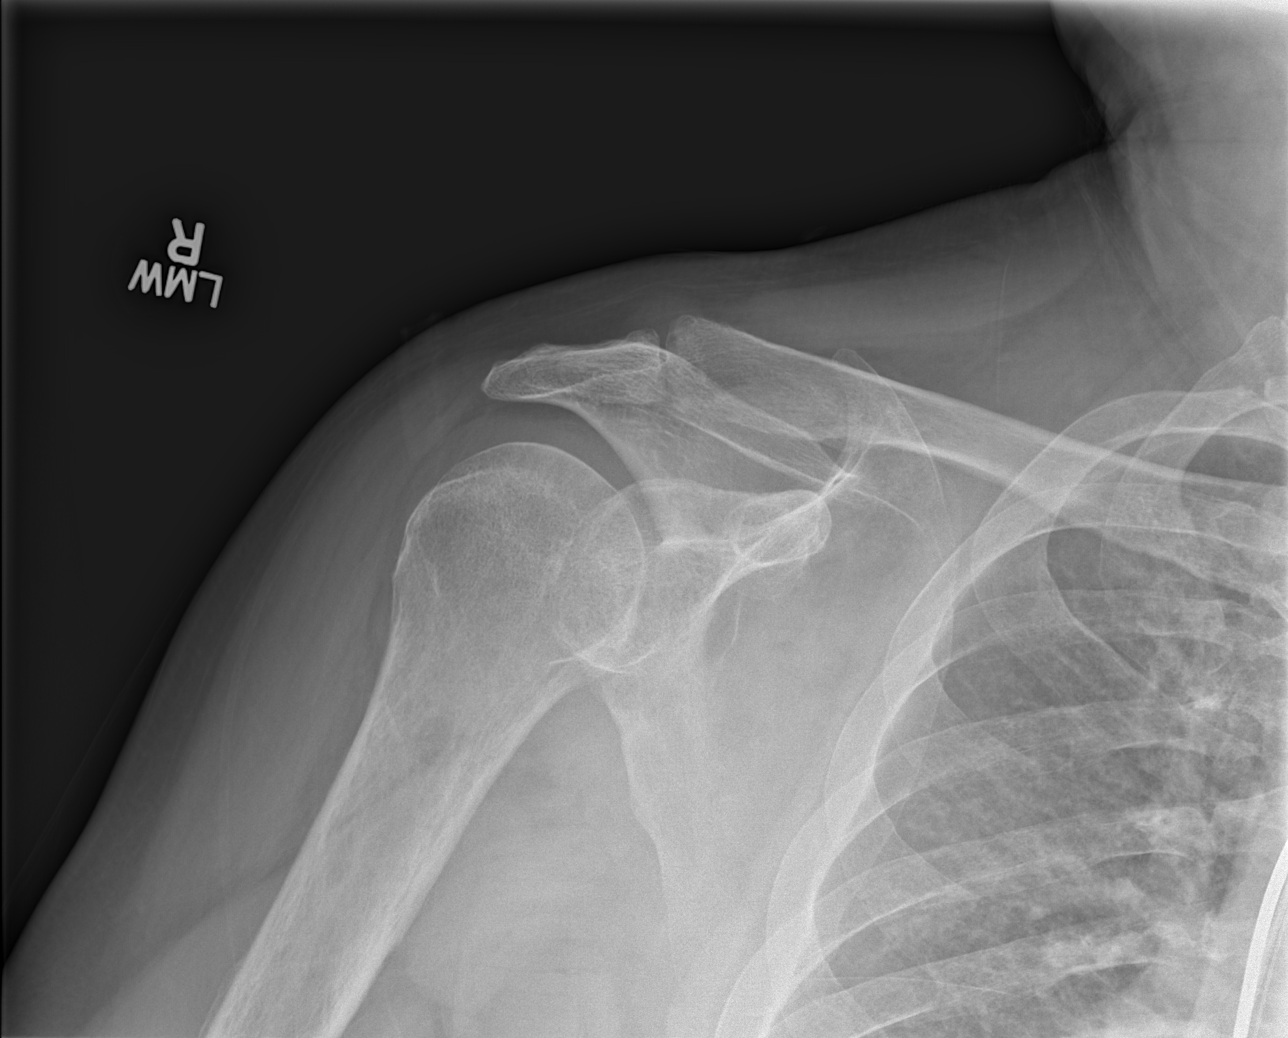
[im 2/3]
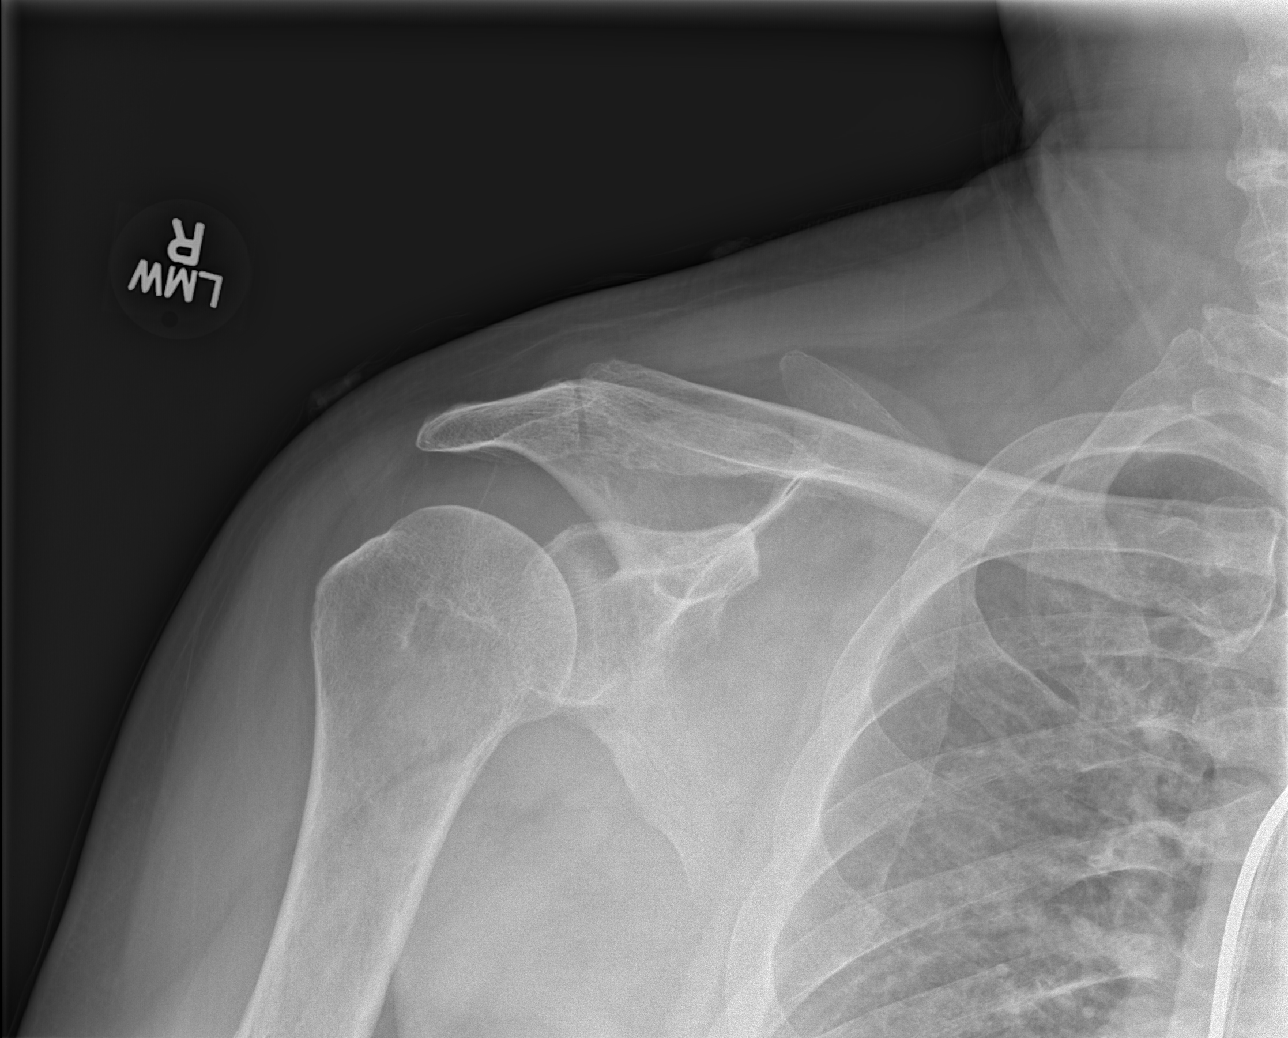
[im 3/3]
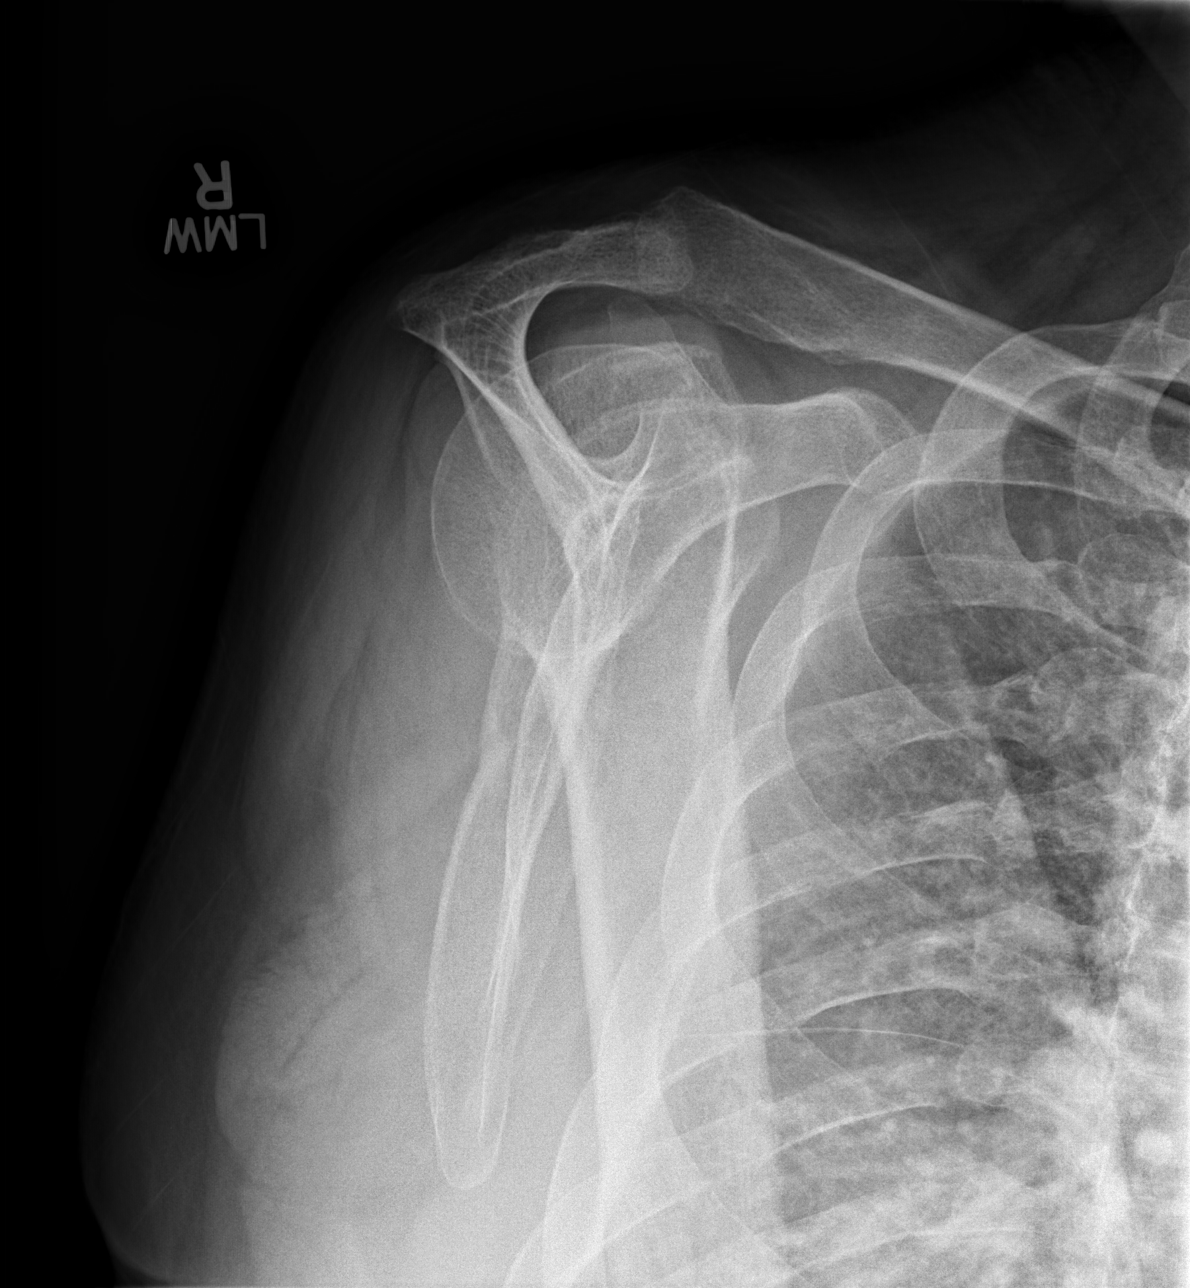

[3 of 3 positions shown; findings below may reference images not displayed]

FINDINGS: There is mild degenerative change of the acromioclavicular joint. No acute
fracture or dislocation.
IMPRESSION: No acute fracture or dislocation.

[REDACTED]

## 2014-08-27 IMAGING — CR DG RIBS 2V*R*
1 series · 4 of 4 positions shown · non-contrast
Comparison: none

REASON FOR EXAM: fall
COMMENTS:

PROCEDURE:     DXR - DXR RIBS RIGHT UNILATERAL  - April 08, 2013  [DATE]
RESULT:     Right rib images demonstrate prominent costochondral
calcification without a definite fracture.

[Series 4: w ribs ap upper right · 0.14mm/px · 4 of 4 slices shown]
[im 1/4]
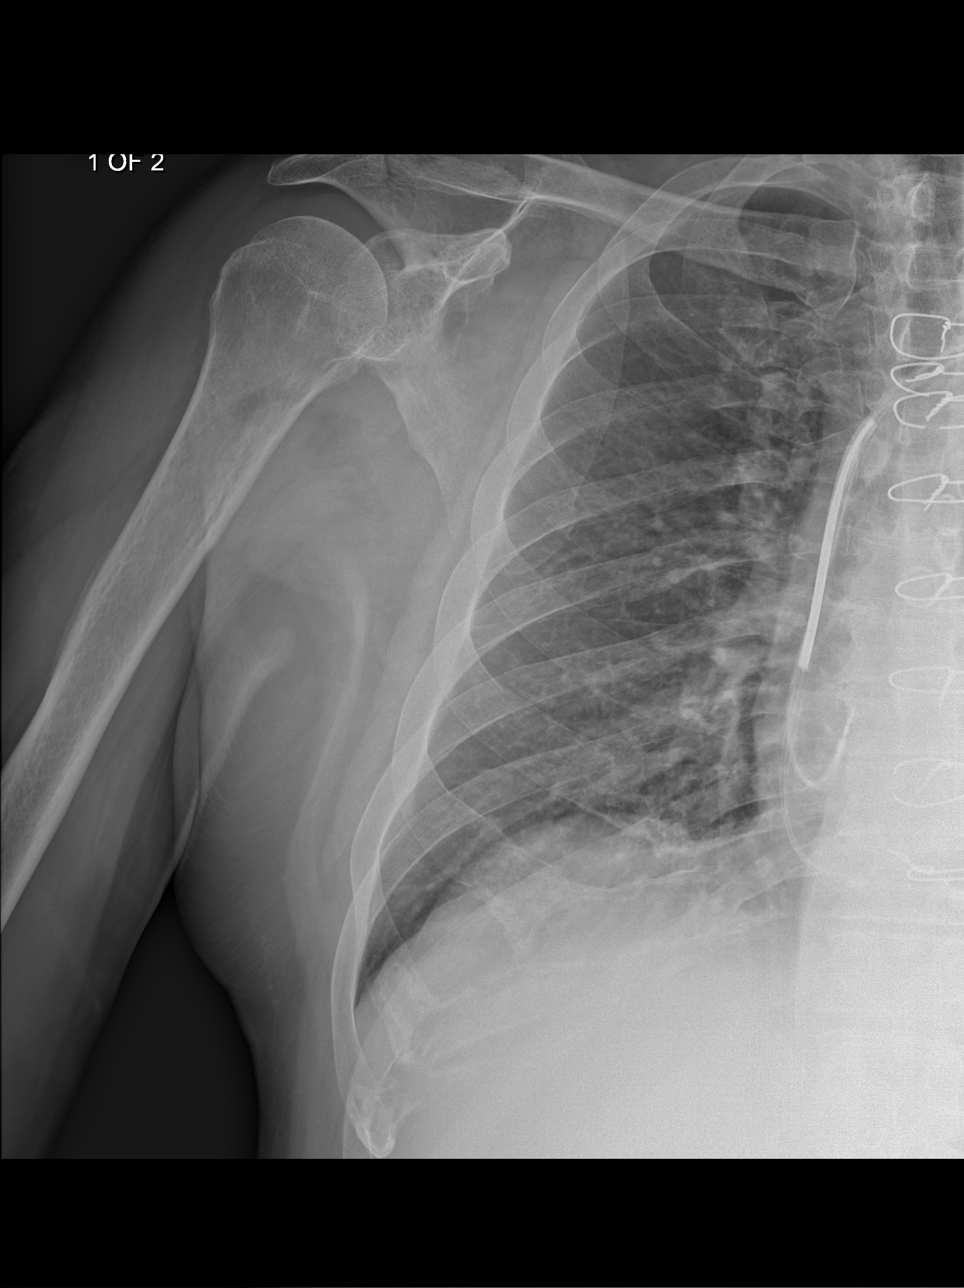
[im 2/4]
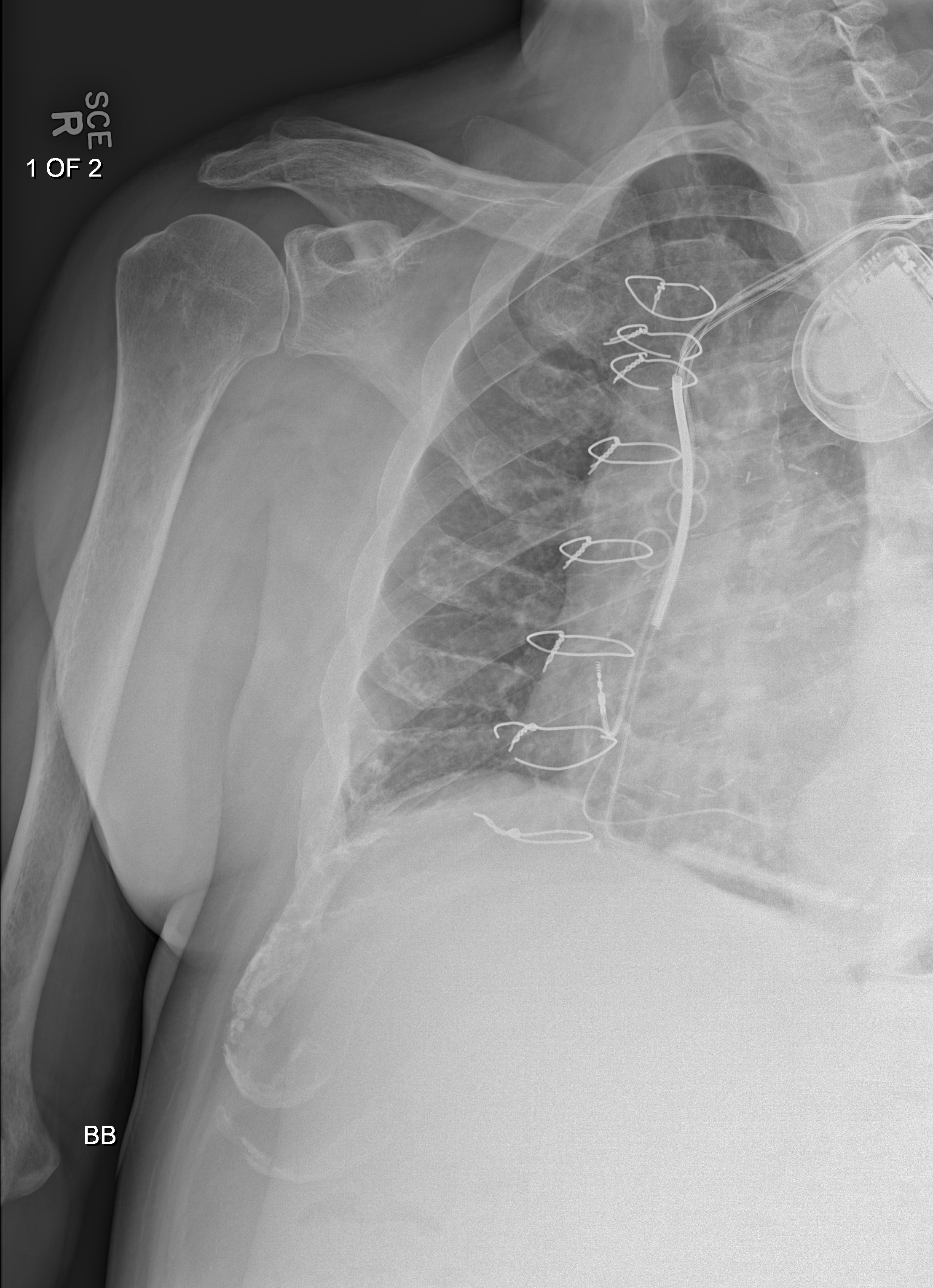
[im 3/4]
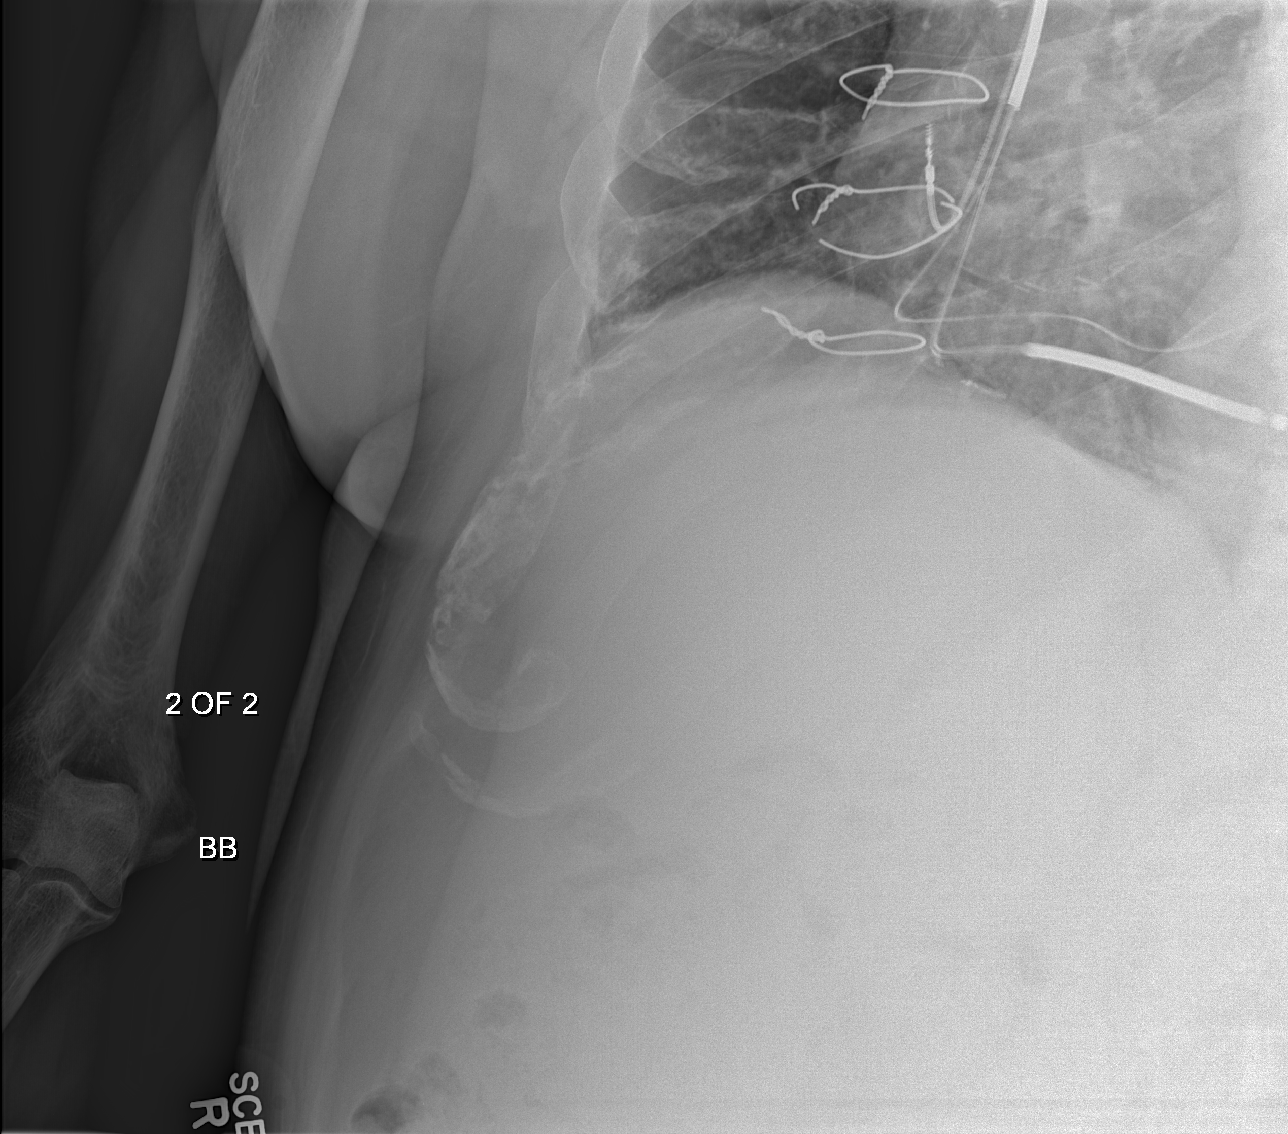
[im 4/4]
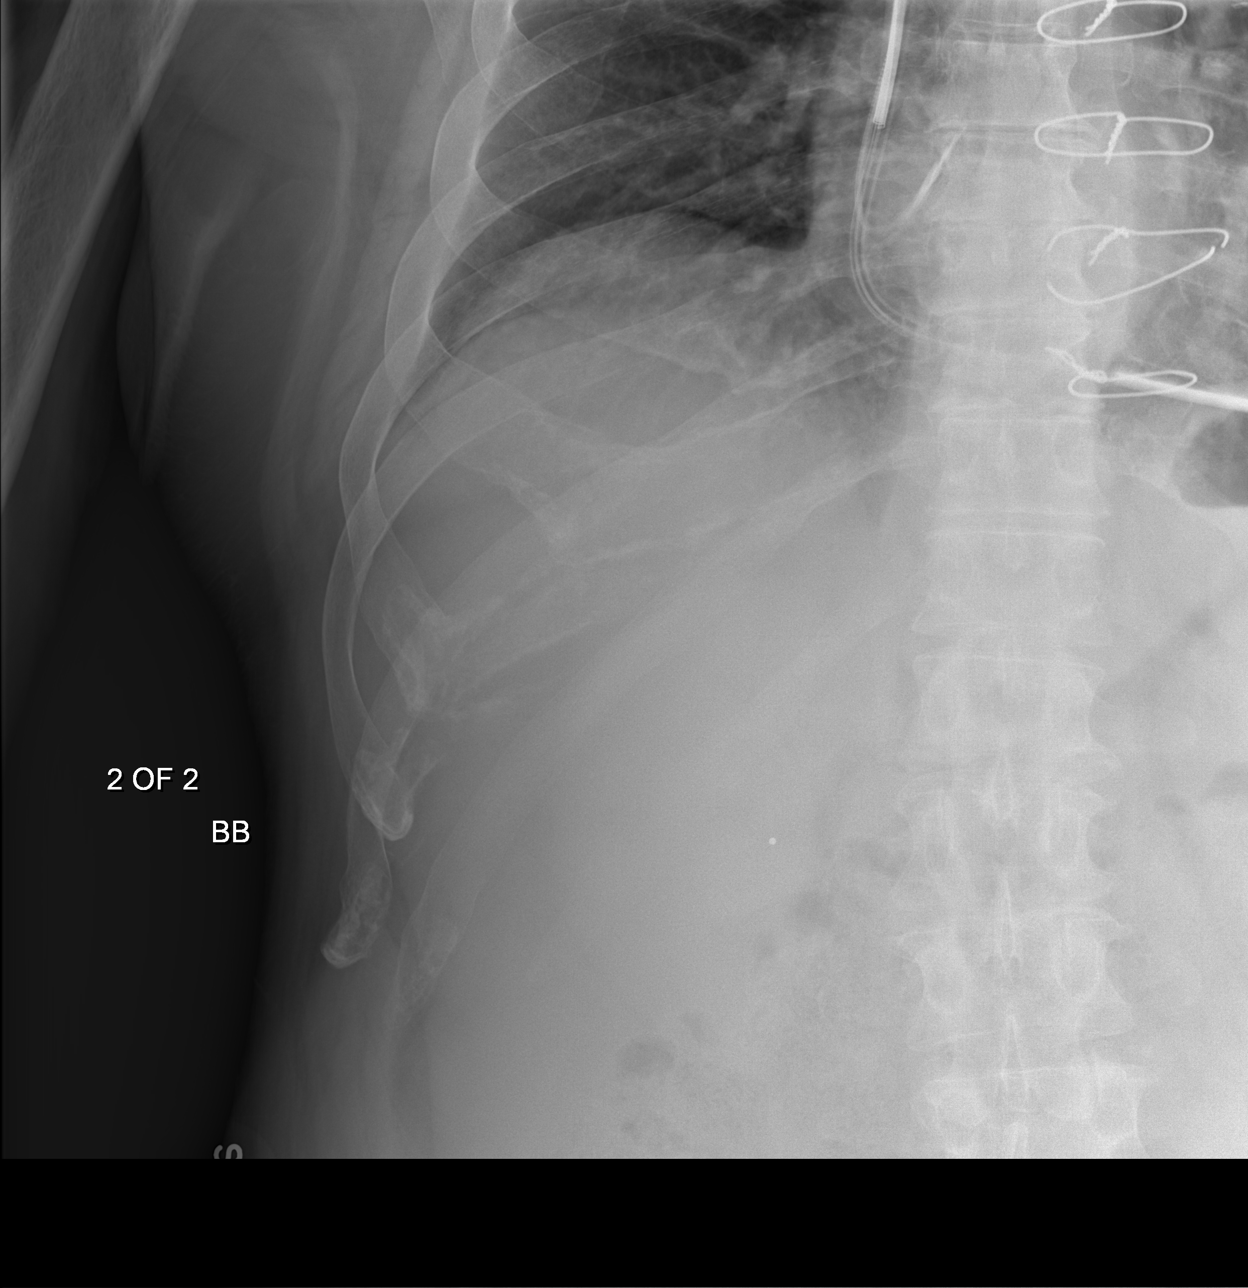

[4 of 4 positions shown; findings below may reference images not displayed]

IMPRESSION: Please see above.

[REDACTED]

## 2014-10-31 ENCOUNTER — Encounter (INDEPENDENT_AMBULATORY_CARE_PROVIDER_SITE_OTHER): Payer: Self-pay | Admitting: Cardiovascular Disease

## 2014-11-08 ENCOUNTER — Encounter (INDEPENDENT_AMBULATORY_CARE_PROVIDER_SITE_OTHER): Payer: Self-pay | Admitting: Cardiovascular Disease

## 2014-11-08 ENCOUNTER — Ambulatory Visit (INDEPENDENT_AMBULATORY_CARE_PROVIDER_SITE_OTHER): Payer: 59 | Admitting: Cardiovascular Disease

## 2014-11-08 VITALS — BP 115/79 | HR 105 | Ht 69.0 in | Wt 246.0 lb

## 2014-11-08 DIAGNOSIS — I25810 Atherosclerosis of coronary artery bypass graft(s) without angina pectoris: Secondary | ICD-10-CM

## 2014-11-08 DIAGNOSIS — I255 Ischemic cardiomyopathy: Principal | ICD-10-CM | POA: Insufficient documentation

## 2014-11-08 DIAGNOSIS — J449 Chronic obstructive pulmonary disease, unspecified: Secondary | ICD-10-CM

## 2014-11-08 DIAGNOSIS — I10 Essential (primary) hypertension: Secondary | ICD-10-CM

## 2014-11-08 DIAGNOSIS — Z95810 Presence of automatic (implantable) cardiac defibrillator: Secondary | ICD-10-CM

## 2014-11-08 MED ORDER — ATORVASTATIN 20 MG TABLET
20.00 mg | ORAL_TABLET | Freq: Every evening | ORAL | Status: DC
Start: 2014-11-08 — End: 2014-11-18

## 2014-11-08 MED ORDER — METOPROLOL SUCCINATE ER 100 MG TABLET,EXTENDED RELEASE 24 HR
100.0000 mg | ORAL_TABLET | Freq: Every day | ORAL | Status: DC
Start: 2014-11-08 — End: 2015-04-10

## 2014-11-08 MED ORDER — AMLODIPINE 10 MG-ATORVASTATIN 20 MG TABLET
1.0000 | ORAL_TABLET | Freq: Every day | ORAL | Status: DC
Start: 2014-11-08 — End: 2014-11-08

## 2014-11-08 NOTE — Progress Notes (Signed)
[facility]  [address]  [place] [id number]  Phone: [phone]  Fax: [phone]    Encounter Date: 11/08/2014    Patient ID:  Jolaine ArtistMelton Comacho  MVH:[id number]    DOB: 07/04/1956  Age: 59 y.o. male    Subjective:     Chief Complaint   Patient presents with   . Pacemaker Check   . CHF   . New Patient     HPI Mr Dorothyann Gibbseely is a 59 year old Caucasian male referred by Dr. Chaney MallingLuke McElwain [place] for followup of multiple cardiac problems and an implanted defibrillator.  He appears to have had an extensive history of coronary artery disease.  He worked for years as a Naval architecttruck driver and, therefore, had cardiac events occurring in multiple locations across the country.  In the year 2000, he had 6-vessel coronary artery bypass surgery performed [facility] [place]  Subsequent to that, he apparently did rather well before having a heart attack in [place] in 2011.  He can remember very little about the circumstances surrounding that heart attack.  In 2013, he had a [employer] biventricular cardioverter-defibrillator implanted in [place].  He has now returned to live in the [place].  He has been there for some time.  He currently has no cardiologist and has not followed up with one since the device was implanted.  He has had multiple discharges of the defibrillator, for which he has not sought medical attention.  His coexisting problems include hypertension, congestive heart failure, chronic obstructive pulmonary disease and non-insulin dependent diabetes.     He is poorly compliant with his medical care.  He does not check his blood sugars on a regular basis.  He does not monitor his blood pressure and, as noted, has not had his defibrillator interrogated.  He has recently been started on doxycycline and prednisone for an upper respiratory tract infection.  The history suggests an ejection fraction around 20-25% at the time of the implantation of his device.  As noted, it is a biventricular defibrillator and an echocardi[place] done in [place] on October 18, 2014, and read by Dr. Casimer BilisJohn Goad [facility] suggested improvement to about a 40% ejection fraction with some moderate left ventricular enlargement.  His medication list includes ACE inhibitors, beta-blocker, spironolactone and diuretics, but he is only on 3.125 mg of carvedilol twice daily.  He describes no actual angina but appears to be class II-III in terms of exercise activity.  His weight appears to be relatively stable and he does not have any significant lower leg edema.  He has not taken nitroglycerin in some time.        Current Outpatient Prescriptions   Medication Sig   . Amlodipine-Atorvastatin 10-20 mg Oral Tablet Take 1 Tab by mouth Once a day   . aspirin (ECOTRIN) 81 mg Oral Tablet, Delayed Release (E.C.) Take 81 mg by mouth Once a day   . benazepril (LOTENSIN) 5 mg Oral Tablet Take 5 mg by mouth Once a day   . doxycycline 100 mg Oral Tablet Take 100 mg by mouth Twice daily Will take last one tonight Evette DoffingLeana Weaver, MA  11/08/2014, 10:11   . ergocalciferol, vitamin D2, (DRISDOL) 50,000 unit Oral Capsule Take 50,000 Int'l Units by mouth Every 7 days   . furosemide (LASIX) 40 mg Oral Tablet Take 40 mg by mouth Once a day   . glipiZIDE (GLUCOTROL) 5 mg Oral Tablet Take 5 mg by  mouth Every morning before breakfast Take 30 minutes before meals   . MetFORMIN (GLUCOPHAGE) 1,000 mg Oral Tablet Take 1,000 mg by mouth Twice daily with food    . metoprolol succinate (TOPROL-XL) 100 mg Oral Tablet Sustained Release 24 hr Take 1 Tab (100 mg total) by mouth Once a day   . MULTIVIT-MIN/FA/LYCOPEN/LUTEIN (SENTRY SENIOR ORAL) Take by mouth   . polysaccharide iron complex (FERREX 150) 150 mg iron Oral Capsule Take 150 mg by mouth Once a day   . predniSONE (DELTASONE) 20 mg Oral Tablet Take 20 mg by mouth Once a day Evette Doffing, MA  11/08/2014, 10:12   today was his last day of this med   . spironolactone (ALDACTONE) 25 mg Oral Tablet Take 25 mg by mouth Once a day     Allergies   Allergen Reactions   . Codeine Nausea/ Vomiting     Past Medical History   Diagnosis Date   . Chronic obstructive airway disease    . Congestive heart failure    . Diabetes mellitus, type 2          Past Surgical History   Procedure Laterality Date   . Hx coronary artery bypass graft     . Hx hernia repair     . Hx back surgery     . Hx vasectomy           No family history on file.      History   Substance Use Topics   . Smoking status: Current Every Day Smoker   . Smokeless tobacco: Not on file   . Alcohol Use: Not on file       Review of Systems   Constitutional: Positive for activity change. Negative for fatigue.   Respiratory: Positive for shortness of breath and wheezing. Negative for chest tightness.    Cardiovascular: Positive for palpitations. Negative for chest pain and leg swelling.   Musculoskeletal: Positive for arthralgias. Negative for gait problem.   Neurological: Negative for dizziness, syncope and light-headedness.     Objective:   Vitals: BP 115/79 mmHg  Pulse 105  Ht 1.753 m ( )  Wt 111.585 kg (246 lb)  BMI 36.31 kg/m2    Physical Exam   Constitutional: He is oriented to person, place, and time. He appears well-developed and well-nourished.   HENT:   Head: Normocephalic.   Neck: Neck supple. No JVD present. No thyromegaly present.    Cardiovascular: Normal rate, regular rhythm, S1 normal and S2 normal.  PMI is not displaced.  Exam reveals gallop and S4. Exam reveals no distant heart sounds and no decreased pulses.    No murmur heard.  Pulmonary/Chest: Effort normal. No respiratory distress. He has no rales.   Abdominal: Soft. Bowel sounds are normal. There is no tenderness.   Musculoskeletal: He exhibits no edema.   Neurological: He is alert and oriented to person, place, and time.   Skin: Skin is warm and dry.     Assessment & Plan:     ENCOUNTER DIAGNOSES     ICD-10-CM   1. Cardiomyopathy, ischemicMr. Sainsbury's cardiomyopathy seems stable and his ejection fraction is somewhat improved.  He does, however, have continuing tachycardia.  I discontinued his carvedilol, which was low-dose, and placed him on metoprolol XL 100 mg per day.  He will continue his aspirin and other medications.  He does appear to be on atorvastatin, so we will continue that at 20 mg per day though he may have been accidently  placed on amlodipine and atorvastatin 10/20.  I will check that with the pharmacist.  He is, in addition, finishing his prednisone and doxycycline.       I25.5   2. Essential hypertension I10   3. Coronary artery disease involving coronary bypass graft of native  coronary disease appears stable without any significant angina.  He has not, however, had a recent stress test in several years and I have ordered a non-walking nuclear stress test to follow up with the amount of ischemia he has present and also reassess his left ventricular ejection fraction.       I25.810    4. ICD (implantable cardioverter-defibrillator), biventricular, in situMr. Chronister's defibrillator shows several episodes of sustained monomorphic ventricular tachycardia with heart rates from 150-170.  These were all successfully cardioverted with a single shock of around 10 joules.  I did reprogram his device slightly to a first shock at 15 joules, a second of 25 and a third of 35 and added anti- tachy pacing which had not been programmed into the device.  If he continues to have episodes, then we will consider starting him on amiodarone.  I did, however, want to see if increasing his beta-blocker and adding ATP would prevent shocks.  He seems to have poor understanding of his device and I explained to him the device is useful not only for improving survival and reducing chances of sudden cardiac death, which certainly applies to him, but also to improve the overall function of the heart.  I finally encouraged him strongly to improve his compliance with his diabetes, to quit smoking, which he has restarted, and to take his medications as directed.       Z95.810   5. COPD (chronic obstructive pulmonary disease) J44.9       Orders Placed This Encounter   . PROG/ANALYSIS-MULTIPLE LEAD ICD ([facility] [place] ONLY)   . NUC SPECT ADENOSINE MUTIPLE [place]   . metoprolol succinate (TOPROL-XL) 100 mg Oral Tablet Sustained Release 24 hr   . Amlodipine-Atorvastatin 10-20 mg Oral Tablet       Return in about 2 months (around 01/07/2015) for return visit.    Quentin Angst, MD

## 2014-11-11 ENCOUNTER — Encounter (INDEPENDENT_AMBULATORY_CARE_PROVIDER_SITE_OTHER): Payer: Self-pay | Admitting: Cardiovascular Disease

## 2014-11-12 ENCOUNTER — Encounter (INDEPENDENT_AMBULATORY_CARE_PROVIDER_SITE_OTHER): Payer: Self-pay | Admitting: Cardiovascular Disease

## 2014-11-18 ENCOUNTER — Other Ambulatory Visit (INDEPENDENT_AMBULATORY_CARE_PROVIDER_SITE_OTHER): Payer: Self-pay | Admitting: Physician Assistant

## 2014-11-18 MED ORDER — ATORVASTATIN 20 MG TABLET
20.0000 mg | ORAL_TABLET | Freq: Every evening | ORAL | Status: DC
Start: 2014-11-18 — End: 2016-04-14

## 2014-11-26 ENCOUNTER — Ambulatory Visit (HOSPITAL_COMMUNITY): Payer: Self-pay | Admitting: Cardiovascular Disease

## 2014-11-28 ENCOUNTER — Encounter (INDEPENDENT_AMBULATORY_CARE_PROVIDER_SITE_OTHER): Payer: Self-pay | Admitting: Cardiovascular Disease

## 2015-01-02 IMAGING — CR DG CHEST 2V
1 series · 2 of 2 positions shown · non-contrast
Comparison: Right rib series 04/08/2013; most recent prior chest
x-ray 06/04/2009

CLINICAL DATA: Chest pain and shortness of breath

EXAM:
CHEST  2 VIEW

[Series 1: w chest pa · 0.14mm/px · 2 of 2 slices shown]
[im 1/2]
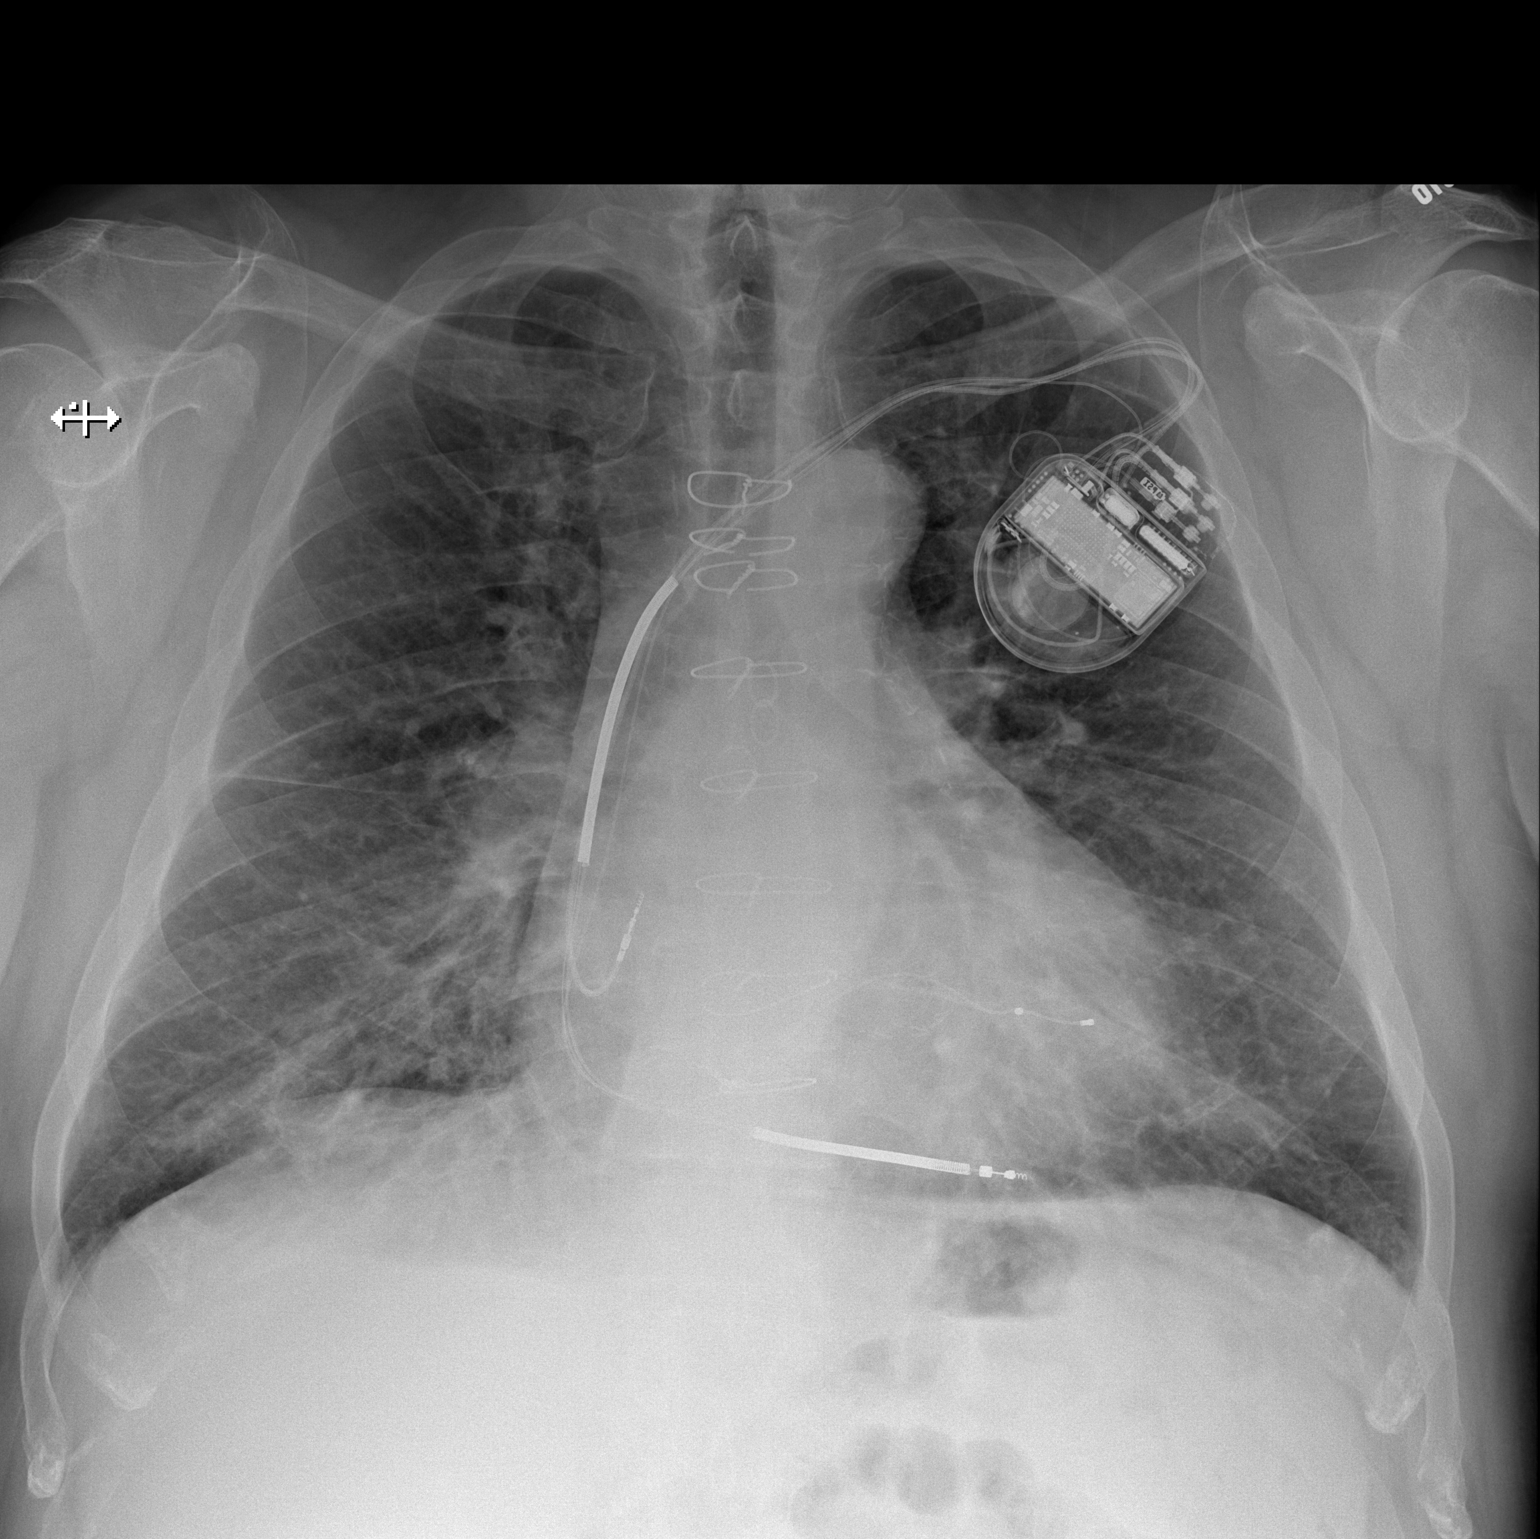
[im 2/2]
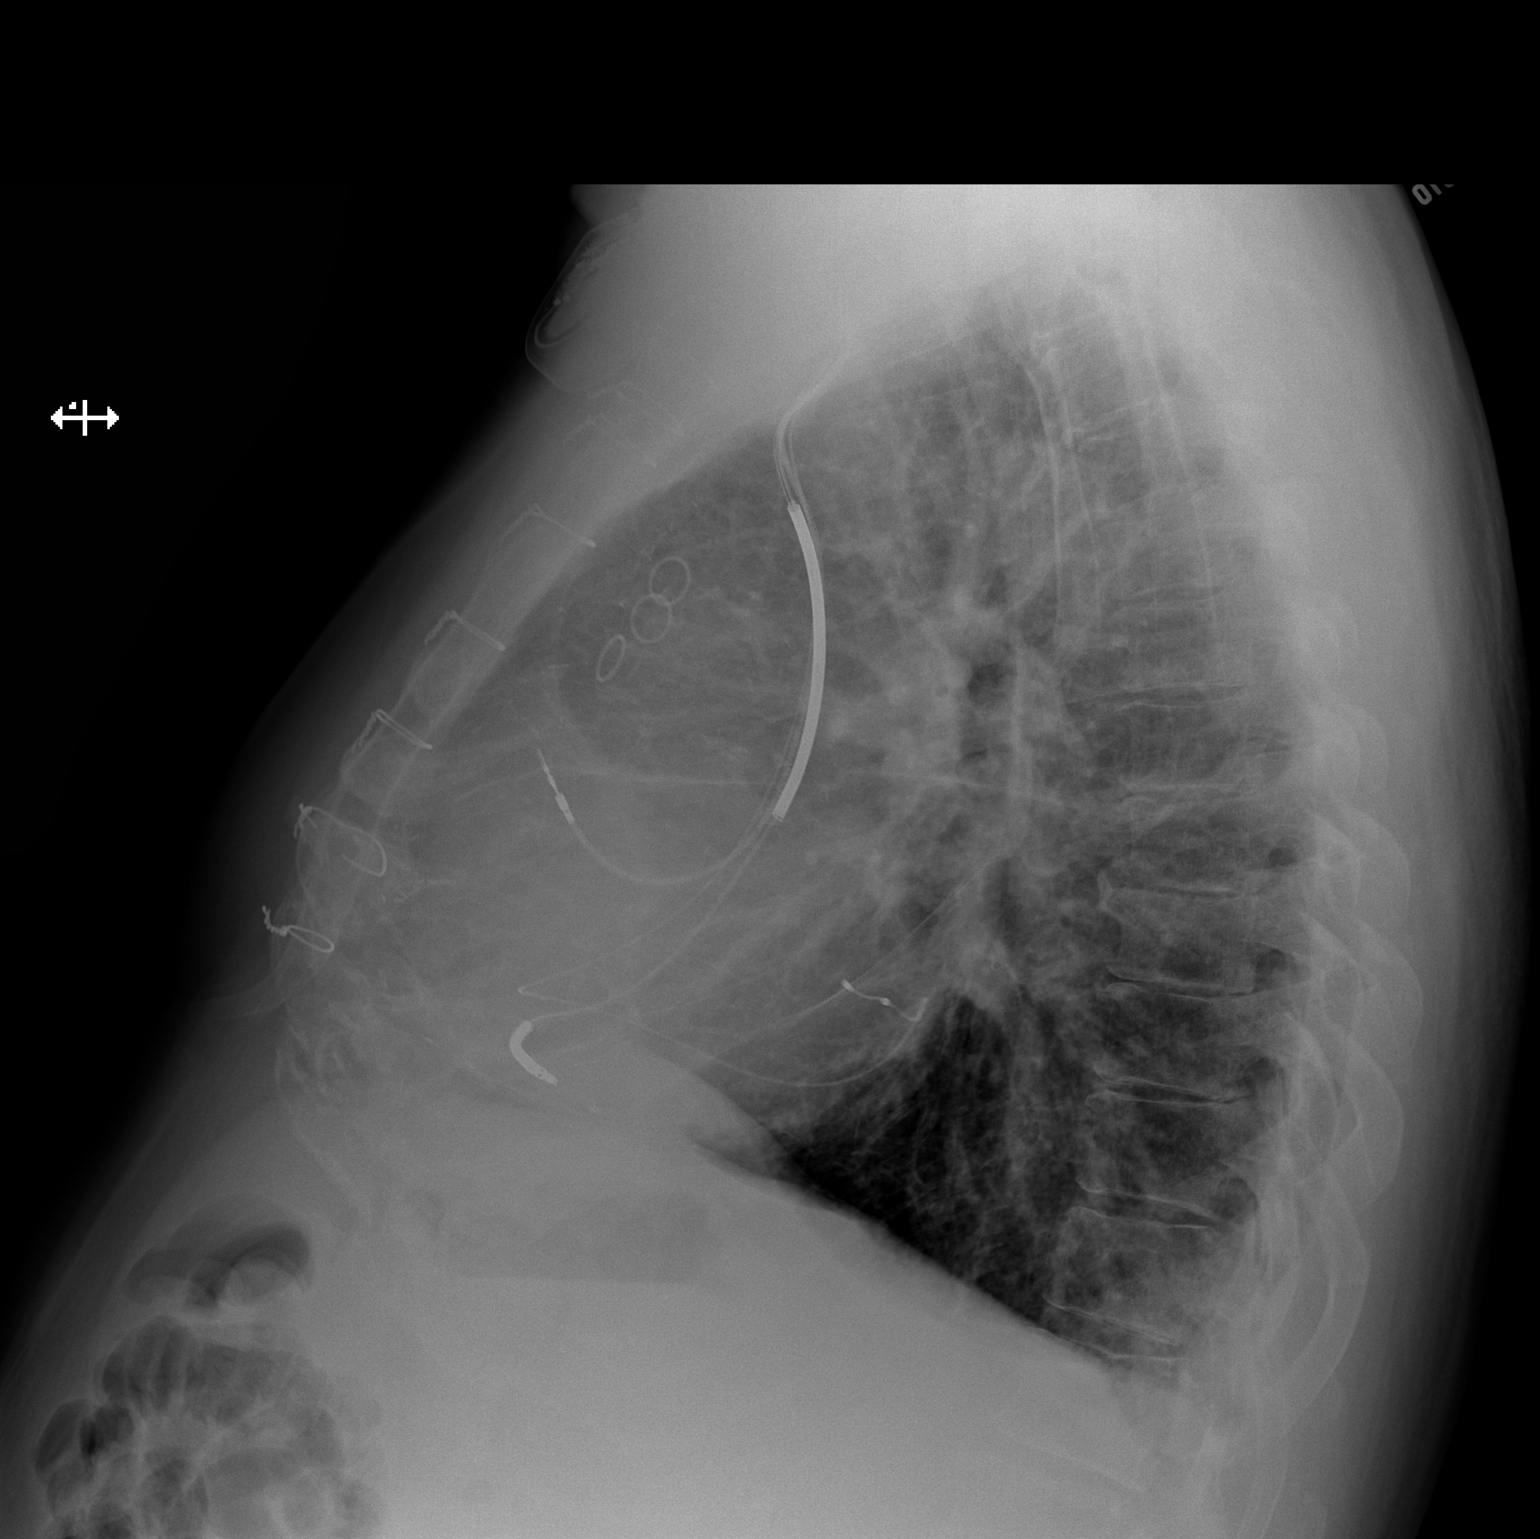

[2 of 2 positions shown; findings below may reference images not displayed]

FINDINGS: Stable mild cardiomegaly. Patient is status post median sternotomy
with evidence of prior multivessel CABG. Stable fractured sternal
wires without evidence of wire migration. Left subclavian approach
biventricular cardiac rhythm maintenance device. Leads project over
the right atrium, right ventricular apex and within a cardiac vein
overlying the left heart. Pulmonary vascular congestion with mild
interstitial edema. Additionally, there are bibasilar linear
opacities favored to reflect atelectasis. No pleural effusion or
pneumothorax. No acute osseous abnormality.
IMPRESSION: 1. Mild-moderate CHF.
2. Bibasilar atelectasis.
3. Stable cardiomegaly.

## 2015-01-03 IMAGING — CR DG CHEST 2V
1 series · 2 of 2 positions shown · non-contrast
Comparison: August 14, 2013.

CLINICAL DATA: Chest pain and history of CHF.

EXAM:
CHEST  2 VIEW

[Series 3: w chest pa · 0.14mm/px · 2 of 2 slices shown]
[im 1/2]
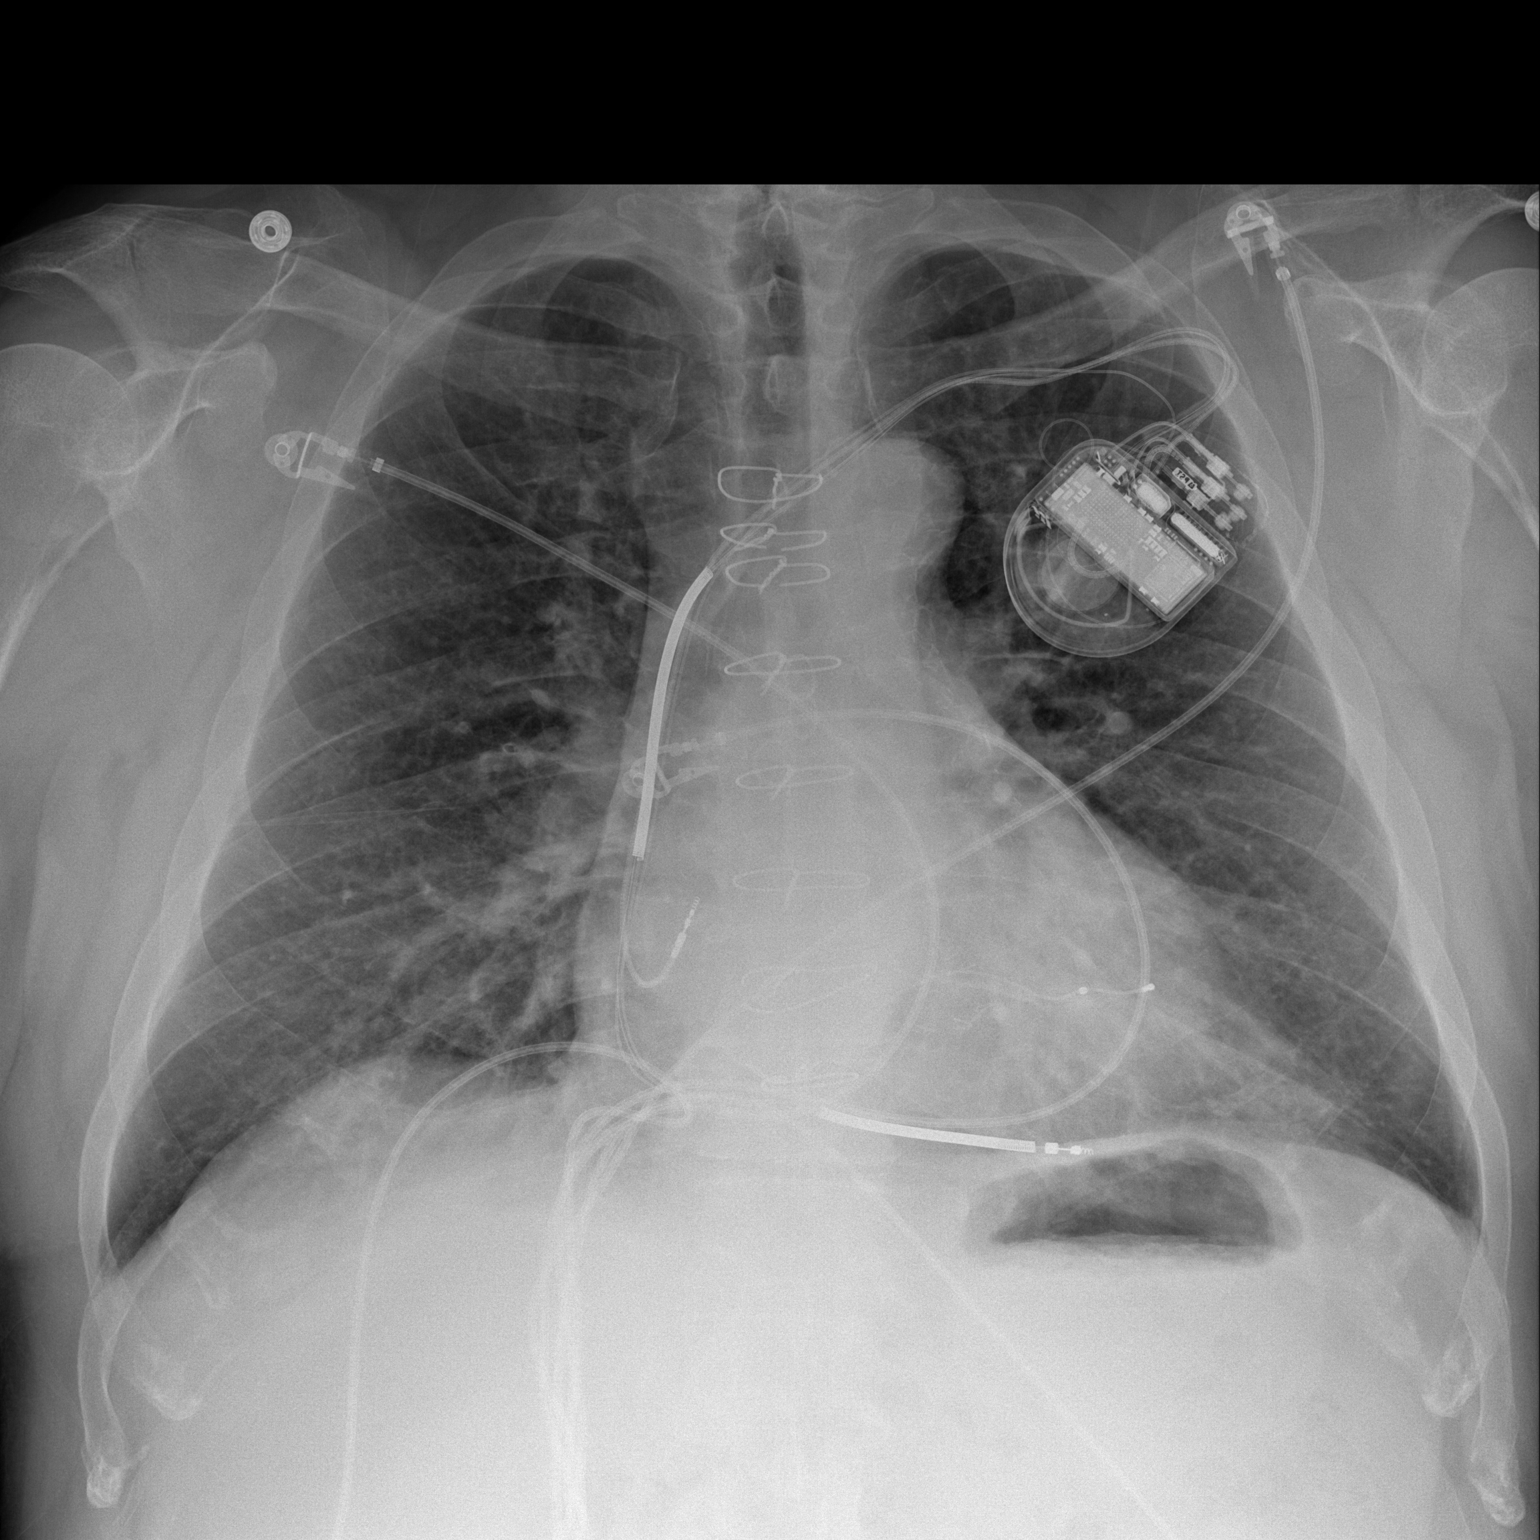
[im 2/2]
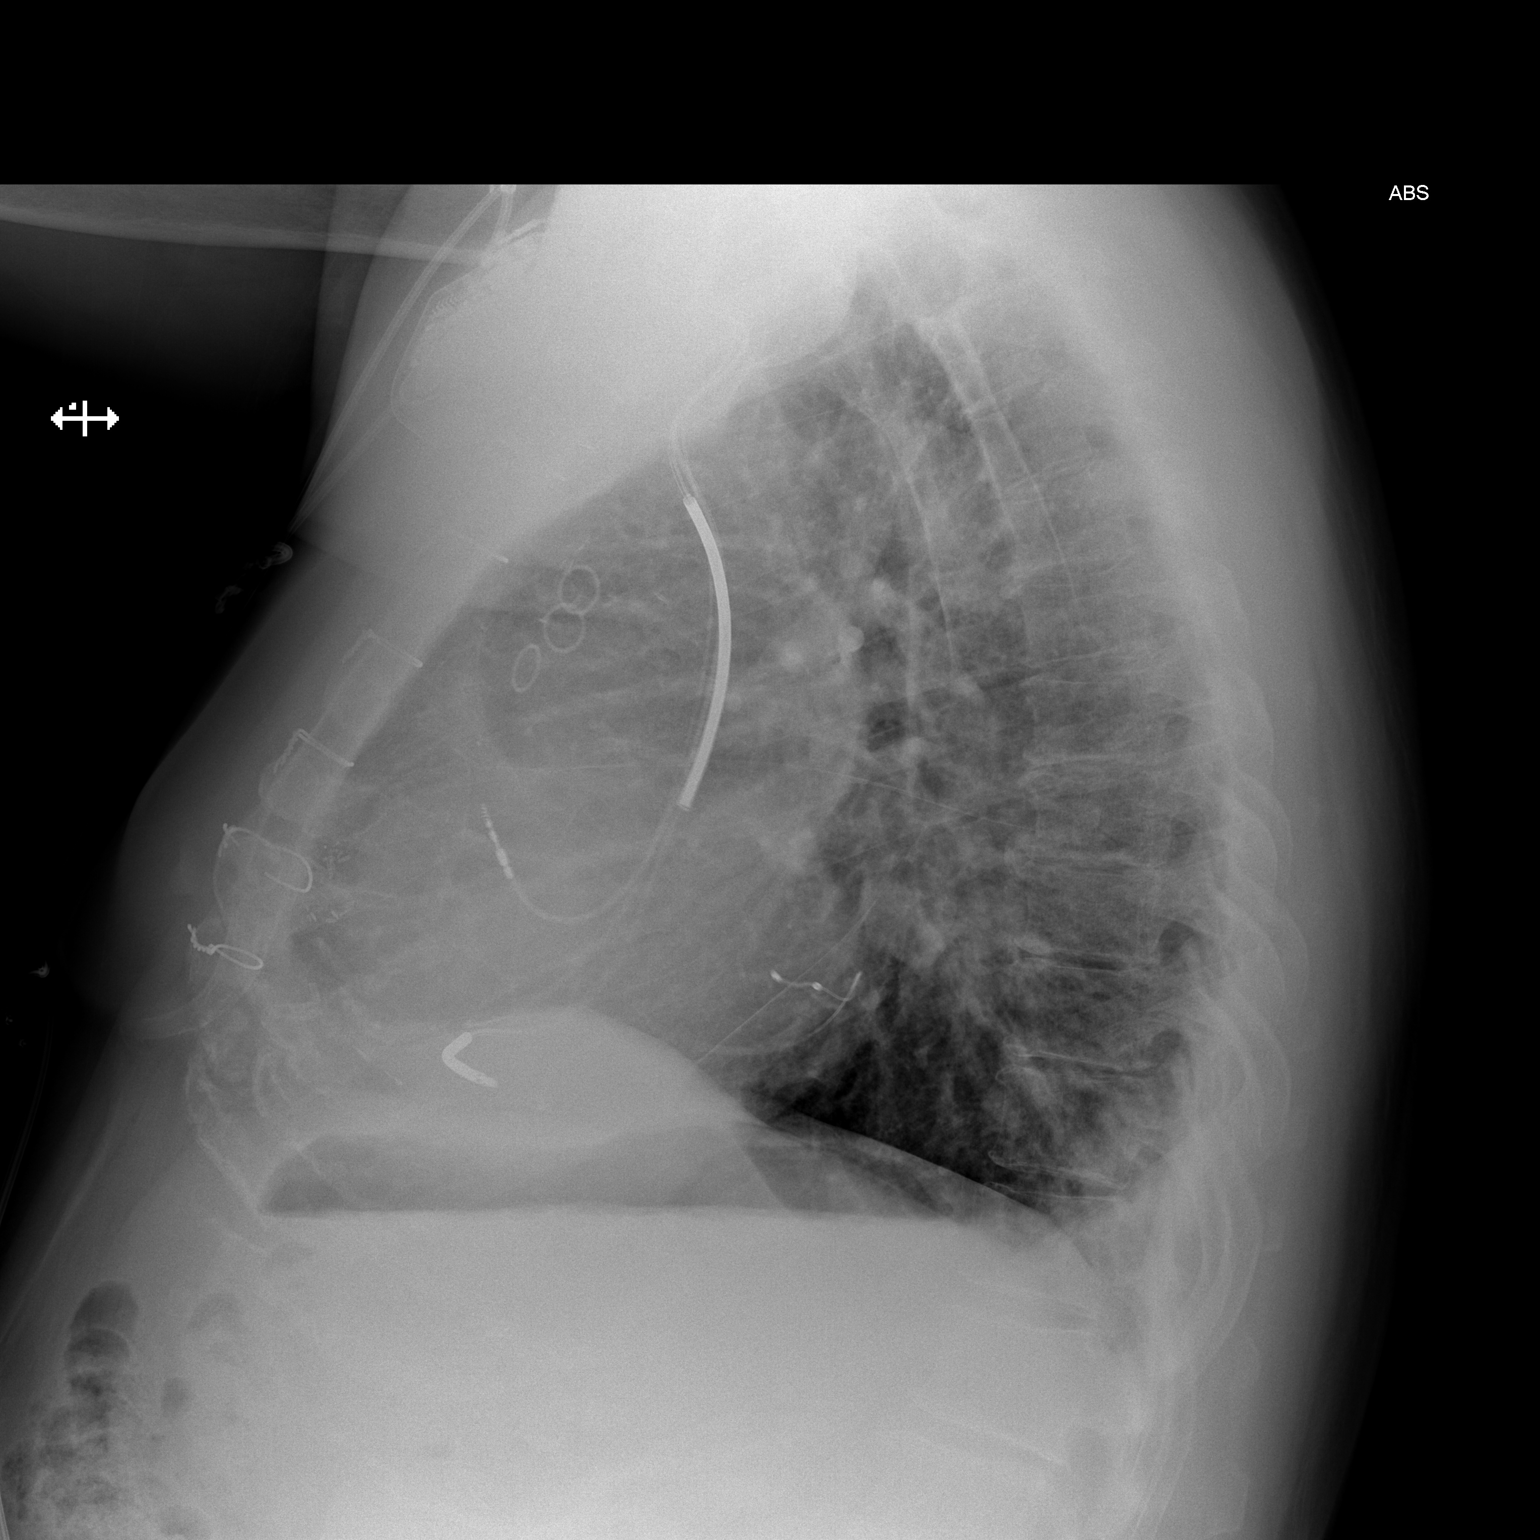

[2 of 2 positions shown; findings below may reference images not displayed]

FINDINGS: The lungs are well expanded. The interstitial markings remain mildly
increased but have improved since yesterday's study The cardiac
silhouette is mildly enlarged though stable. A permanent pacemaker
defibrillator is in place and unchanged. The patient has undergone
previous CABG. At there is a broken next to lowermost sternal wire.
There is no pleural effusion or pneumothorax. The observed portions
of the ribs and thoracic spine exhibit no acute abnormalities.
IMPRESSION: There has been mild interval improvement in the appearance of the
pulmonary interstitium and pulmonary vascularity consistent with
ongoing resolving of CHF.

## 2015-01-07 ENCOUNTER — Ambulatory Visit (INDEPENDENT_AMBULATORY_CARE_PROVIDER_SITE_OTHER): Payer: 59 | Admitting: Cardiovascular Disease

## 2015-01-07 ENCOUNTER — Encounter (INDEPENDENT_AMBULATORY_CARE_PROVIDER_SITE_OTHER): Payer: Self-pay | Admitting: Cardiovascular Disease

## 2015-01-07 VITALS — BP 80/60 | HR 88 | Ht 69.0 in | Wt 244.0 lb

## 2015-01-07 DIAGNOSIS — I255 Ischemic cardiomyopathy: Principal | ICD-10-CM

## 2015-01-07 DIAGNOSIS — I25810 Atherosclerosis of coronary artery bypass graft(s) without angina pectoris: Secondary | ICD-10-CM

## 2015-01-07 DIAGNOSIS — Z95810 Presence of automatic (implantable) cardiac defibrillator: Secondary | ICD-10-CM

## 2015-01-07 DIAGNOSIS — I10 Essential (primary) hypertension: Secondary | ICD-10-CM

## 2015-01-07 MED ORDER — FUROSEMIDE 40 MG TABLET
40.00 mg | ORAL_TABLET | Freq: Two times a day (BID) | ORAL | Status: DC
Start: 2015-01-07 — End: 2015-04-03

## 2015-01-07 MED ORDER — SPIRONOLACTONE 50 MG TABLET
50.0000 mg | ORAL_TABLET | Freq: Every day | ORAL | Status: AC
Start: 2015-01-07 — End: ?

## 2015-01-07 NOTE — Progress Notes (Signed)
[facility]  [address]  [place] [id number]  Phone: [phone]  Fax: [phone]    Encounter Date: 01/07/2015    Patient ID:  Joshua Pennington  MVH:[id number]    DOB: [birth year]  Age: 59 y.o. male    Subjective:     Chief Complaint   Patient presents with   . Other Primary Cardiomyopathies     HPI Comments: Joshua Pennington returns for a routine follow up visit for CHF/cardiomyopathy. Continues to get fuller abdomen and drinks large amounts of fluid. Still has no scales and has little to no understancing of his overall medical problems. Motivation appears poor. No more shocks, no CP      Current Outpatient Prescriptions   Medication Sig   . aspirin (ECOTRIN) 81 mg Oral Tablet, Delayed Release (E.C.) Take 81 mg by mouth Once a day   . atorvastatin (LIPITOR) 20 mg Oral Tablet Take 1 Tab (20 mg total) by mouth Every evening   . benazepril (LOTENSIN) 5 mg Oral Tablet Take 5 mg by mouth Once a day   . doxycycline 100 mg Oral Tablet Take 100 mg by mouth Twice daily Will take last one tonight Evette DoffingLeana Weaver, MA  11/08/2014, 10:11   . ergocalciferol, vitamin D2, (DRISDOL) 50,000 unit Oral Capsule Take 50,000 Int'l Units by mouth Every 7 days   . furosemide (LASIX) 40 mg Oral Tablet Take 1 Tab (40 mg total) by mouth Twice daily Take an additional 40 mg in AM if weight up over 4 lbs   . insulin glargine (LANTUS) 100 unit/mL Subcutaneous injection (vial) by Subcutaneous route Every night   . MetFORMIN (GLUCOPHAGE) 1,000 mg Oral Tablet Take 1,000 mg by mouth Twice daily with food   . metoprolol succinate (TOPROL-XL) 100 mg Oral Tablet Sustained Release 24 hr Take 1 Tab (100 mg total) by mouth Once a day   . MULTIVIT-MIN/FA/LYCOPEN/LUTEIN (SENTRY SENIOR ORAL) Take by mouth   . polysaccharide iron complex (FERREX 150) 150 mg iron Oral Capsule Take 150 mg by mouth Once a day   . predniSONE (DELTASONE) 20 mg Oral Tablet Take 20 mg by mouth Once a day Evette DoffingLeana Weaver, MA  11/08/2014, 10:12   today was his last  day of this med   . spironolactone (ALDACTONE) 50 mg Oral Tablet Take 1 Tab (50 mg total) by mouth Once a day     Allergies   Allergen Reactions   . Codeine Nausea/ Vomiting     Past Medical History   Diagnosis Date   . Chronic obstructive airway disease    . Congestive heart failure    . Diabetes mellitus, type 2          Past Surgical History   Procedure Laterality Date   . Hx coronary artery bypass graft     . Hx hernia repair     . Hx back surgery     . Hx vasectomy           Family History   Problem Relation Age of Onset   . No Known Problems Mother    . No Known Problems Father          History   Substance Use Topics   . Smoking status: Current Every Day Smoker   . Smokeless tobacco: Not on file   . Alcohol Use: Not on file       Review of Systems   Constitutional: Negative for activity change and fatigue.   Respiratory: Positive for shortness  of breath. Negative for chest tightness.    Cardiovascular: Positive for leg swelling. Negative for chest pain and palpitations.   Gastrointestinal: Positive for abdominal distention.   Musculoskeletal: Positive for arthralgias. Negative for gait problem.   Neurological: Negative for dizziness, syncope and light-headedness.     Objective:   Vitals: BP 80/60 mmHg  Pulse 88  Ht 1.753 m ( )  Wt 110.678 kg (244 lb)  BMI 36.02 kg/m2    Physical Exam   Constitutional: He is oriented to person, place, and time. He appears well-developed and well-nourished.   HENT:   Head: Normocephalic.   Neck: Neck supple. No JVD present. No thyromegaly present.   Cardiovascular: Normal rate, regular rhythm, S1 normal and S2 normal.  PMI is not displaced.  Exam reveals gallop and S4. Exam reveals no distant heart sounds and no decreased pulses.    No murmur heard.  Pulmonary/Chest: Effort normal. No respiratory distress. He has no rales.   Abdominal: Soft. Bowel sounds are normal. He exhibits distension. There is no tenderness.   Musculoskeletal: He exhibits no edema.   Neurological:  He is alert and oriented to person, place, and time.   Skin: Skin is warm and dry.     Assessment & Plan:     ENCOUNTER DIAGNOSES     ICD-10-CM   1. Cardiomyopathy, ischemic I25.5   2. Essential hypertension I10   3. Coronary artery disease involving coronary bypass graft of native heart I25.810   4. ICD (implantable cardioverter-defibrillator), biventricular, in situ Z95.810     Continue lasix 40 BID, increase to 80 qam, 40 qpm as needed. 2000 cc daily fluid restriction with daily weights,continue metoprolol which seems to be working for HR.  Orders Placed This Encounter   . BASIC METABOLIC PANEL, FASTING   . MAGNESIUM   . LIPID PANEL   . spironolactone (ALDACTONE) 50 mg Oral Tablet   . furosemide (LASIX) 40 mg Oral Tablet       Return in about 2 days (around 01/09/2015).    Quentin Angst, MD

## 2015-01-14 ENCOUNTER — Encounter (INDEPENDENT_AMBULATORY_CARE_PROVIDER_SITE_OTHER): Payer: Self-pay | Admitting: Cardiovascular Disease

## 2015-01-15 ENCOUNTER — Other Ambulatory Visit (INDEPENDENT_AMBULATORY_CARE_PROVIDER_SITE_OTHER): Payer: Self-pay | Admitting: Cardiovascular Disease

## 2015-01-21 NOTE — Consult Note (Signed)
Brief Consult Note: Diagnosis: depression nos, cocaine dependence.   Patient was seen by consultant.   Consult note dictated.   Recommend further assessment or treatment.   Orders entered.   Comments: Psychiatry: Patient seen. Depressed and recent relapse to cocaine use. Currently denies intent to harm self in the hospital and is lucid and cooperative. I agree to admit to [facility] once he is discharged from medicine. Will ask to have him sign voluntary. Does not need a sitter currently for safety. Will start celexa for depression and anxiety. Will follow.  Electronic Signatures: Makynzie Dobesh, Madie Reno (MD)  (Signed 05-Dec-13 16:08)  Authored: Brief Consult Note   Last Updated: 05-Dec-13 16:08 by Gonzella Lex (MD)

## 2015-01-21 NOTE — Consult Note (Signed)
Brief Consult Note: Diagnosis: CP, elevated troponin, NSTEMI secondary to cocaine binge, now CP free.   Patient was seen by consultant.   Consult note dictated.   Comments: REC  Agree with current therapy, cont enoxaparin 48-72h, resume cardiac meds, prefer initial conservative management, review echo.  Electronic Signatures: Isaias Cowman (MD)  (Signed 04-Dec-13 15:42)  Authored: Brief Consult Note   Last Updated: 04-Dec-13 15:42 by Isaias Cowman (MD)

## 2015-01-21 NOTE — H&P (Signed)
PATIENT NAME:  Ronald Miller, CELONA MR#:  [id number] DATE OF BIRTH:  1955/12/19  DATE OF ADMISSION:  09/06/2012  PRIMARY CARE PHYSICIAN: None.  REFERRING ER PHYSICIAN: Ferman Hamming, MD  CHIEF COMPLAINT: Chest.   HISTORY OF PRESENT ILLNESS: This is a 59 year old male with past medical history of coronary artery disease and coronary artery bypass graft in 2000, congestive heart failure status post automatic implantable cardiac defibrillator in January 2013, diabetes mellitus, chronic smoker and drug abuser, and history of depression and anxiety disorder. He says for the last few months his life has been very stressful and so he has been smoking more and using more drugs. He said he used cocaine in the last two days. He smoked cocaine worth 1200 dollars. The last time he smoked cocaine was this morning. He denies any IV use. At 4:00 a.m. in the morning, he started having chest pain which is retrosternal and all over the chest and continuously getting worse. Maximum went up to 8 to 9 out of 10, pressure-like, nonradiating, and no relieving or exacerbating factor, so he decided to come back to emergency room. He came to the ER and was given nitroglycerin and aspirin. His pain is now slowly getting better. The pain is right now 4 or 5 out of 10. He denies any associated complaint of cough, shortness of breath, fever, palpitations, or syncopal episode. He denies any edema of the limbs. The ER physician did initial work-up and found him having elevated troponin and so he was given as admission for acute non-ST-elevation myocardial infarction.   REVIEW OF SYSTEMS: CONSTITUTIONAL: Denies any fever, fatigue, weakness, or weight loss. EYES: Denies any blurring or double lesion or any redness or inflammation. ENT: Denies tinnitus, hearing loss, or discharge from the ears. RESPIRATORY: Denies any cough, wheezing, hemoptysis, or dyspnea. CARDIOVASCULAR: Had chest pain, but denies any orthopnea, edema, arrhythmia or  palpitations. GASTROINTESTINAL: Denies any nausea, vomiting, diarrhea, abdominal pain, or change in bowel habits. GENITOURINARY: Denies any dysuria, hematuria, increased frequency, or incontinence. ENDOCRINE: Denies polyuria, nocturia, increased sweating, or heat or cold intolerance. SKIN: Denies any rashes, lesions, or change in skin color. MUSCULOSKELETAL: Denies any pain or swelling in any joints. NEUROLOGICAL: Denies any numbness, weakness, headache, vertigo, or tremors. PSYCHIATRIC: Has history of anxiety and depression and currently he says that he is under stress due to his overall condition.   PAST MEDICAL HISTORY:  1. Coronary artery bypass graft in 2000, total number six. 2. Congestive heart failure. 3. Status post automatic implantable cardiac defibrillator in January 2013, done in [place] state. He recently moved to [place] again and so he does not have any primary care physician over here. 4. Diabetes mellitus. 5. Smoking history. 6. Chronic obstructive pulmonary disease. 7. Anxiety and depression.   PAST SURGICAL HISTORY: Coronary artery bypass graft.  DRUG ALLERGIES: Codeine.   SOCIAL HISTORY: He is a smoker, one pack of cigarettes per day. He uses cocaine. Denies using alcohol.   FAMILY HISTORY: Positive for cardiac history in both parent's side of family. His grandmother died of lung cancer and brain cancer.   PHYSICAL EXAMINATION:   VITAL SIGNS: Temperature 99.1, pulse rate 89, respirations 20, blood pressure 101/68, and pulse oximetry 94.   GENERAL: Fully alert and oriented to time, place, and person, in no acute distress.   HEENT: Conjunctivae pink. Oral mucosa moist. Hearing grossly intact.   NECK: Supple. No JVD. Trachea central.   RESPIRATORY: Bilaterally clear and equal air entry. No crackles or rhonchi  appreciated.   CARDIOVASCULAR: S1 and S2 present, regular. No murmur. On the left side of upper chest, subcutaneous, AICD is present.   ABDOMEN: Soft  and nontender. Bowel sounds present. No organomegaly appreciated.   NEUROLOGICAL: Moves all four limbs. No gross sensory or motor abnormalities. No tremor. No rigidity. Cranial nerves grossly intact.   SKIN: No rashes.   EXTREMITIES: No edema.   JOINTS: No swelling and no tenderness.   PSYCHIATRIC: Currently does not appear having any great psychiatric illness, but he says that he has been having stressful life for the last few months. He does not appear having any suicidal ideation, but we will consider psych evaluation.  LABS/RADIOLOGIC STUDIES: Glucose 161, BUN 18, creatinine 0.82, sodium 137, potassium 3.8, chloride 104, CO2 25, calcium 8.6, total protein 7.4, albumin 4.0, bilirubin 0.8. alkaline phosphatase 64, SGOT 34, and SGPT 69. CK total 252, CK-MB 8.7, and troponin 0.53. TSH 0.71. Urine for toxicology is positive for cocaine. WBC 8.8, hemoglobin 14.3, platelet count 141, and MCV 91. Prothrombin 13.7. INR 1.0. Activated PTT 30.9.   Urinalysis is grossly negative.   Acetaminophen level is less than 2. Salicylate 2.1   EKG reviewed, paced rhythm and ST-T changes. No previous EKG for comparison. Hard to appreciate changes as it is a paced rhythm.  ASSESSMENT AND PLAN: A 59 year old male with strong cardiac history who presented after heavy cocaine use with chest pain.  1. Non-ST-elevation myocardial infarction. This might be demand versus supply ischemia. He has high troponin and in view of strong cardiac history chest pain is significant so we will admit him on the telemetry floor and give him Lovenox, therapeutic dose. We will follow serial troponin and give him aspirin and simvastatin. We will hold beta blocker for now as he has recent cocaine use history. Cardiology consult with Dr. Nehemiah Massed as the patient preferred to go to Dr. Nehemiah Massed and he moved last month to [facility].  2. Coronary artery bypass graft, congestive heart failure status post automatic implantable cardiac  defibrillator. We do not have a baseline echocardiogram, so we will get one echocardiogram in this admission. Currently the patient is not in any heart failure. We will continue his ACE inhibitor and spironolactone.  3. Diabetes mellitus. We will hold his oral antihyperglycemic drugs and give him insulin on sliding scale after glucose checks.  4. Chronic obstructive pulmonary disease. He is an active smoker. I did counseling for smoking cessation for five minutes. He says he will try, he tried quitting in the past also. DuoNeb p.r.n. as he has been using rescue inhalers almost 1 to 2 times every day.  5. Anxiety and depression. Behavior Medicine consult has been called by the ER physician. Currently he does not appear to be having any suicidal ideation. 6. Deep vein thrombosis prophylaxis. He is on therapeutic dose of Lovenox. 7. GI prophylaxis. Pantoprazole 40 mg oral daily.   CODE STATUS: FULL CODE.   TOTAL TIME SPENT: 55 minutes.  ____________________________ Ceasar Lund Anselm Jungling, MD vgv:slb D: 09/06/2012 13:48:27 ET T: 09/06/2012 14:10:32 ET JOB#: [id number]  cc: Ceasar Lund. Anselm Jungling, MD, <Dictator> Vaughan Basta MD ELECTRONICALLY SIGNED 09/18/2012 22:56

## 2015-01-21 NOTE — H&P (Signed)
PATIENT NAME:  Ronald Miller, Ronald Miller MR#:  [id number] DATE OF BIRTH:  July 25, 1956  DATE OF ADMISSION:  09/06/2012  ADDENDUM:   HOME MEDICATIONS:   1. Viagra 50 mg oral tablet once a day as needed.  2. Spironolactone 25 mg orally once a day.  3. Once A Day 50+ oral tablet once a day. 4. Metformin 1000 mg oral two times a day.  5. Glipizide 10 mg oral once a day.  6. Fluticasone 40 mg oral once a day.  7. Carvedilol 12.5 mg oral two times a day. 8. Benazepril 10 mg oral once a day.   ____________________________ Ceasar Lund. Anselm Jungling, MD vgv:cbb D: 09/06/2012 15:21:20 ET T: 09/06/2012 15:25:33 ET JOB#: [id number]  cc: Ceasar Lund. Anselm Jungling, MD, <Dictator> Vaughan Basta MD ELECTRONICALLY SIGNED 09/18/2012 22:56

## 2015-01-21 NOTE — Consult Note (Signed)
PATIENT NAME:  Ronald Miller, STUCKEY MR#:  [id number] DATE OF BIRTH:  [birth year]  DATE OF CONSULTATION:  09/06/2012  REFERRING PHYSICIAN:   CONSULTING PHYSICIAN:  Isaias Cowman, MD  PRIMARY CARE PHYSICIAN: None.  CHIEF COMPLAINT: Chest pain.   REASON FOR CONSULTATION: Consultation requested for evaluation of elevated troponin and chest pain.   HISTORY OF PRESENT ILLNESS: Patient is a 59 year old gentleman with known coronary artery disease referred for evaluation of chest pain and elevated troponin. Patient has known coronary artery disease status post bypass graft surgery in 2000 at [facility]. Patient is status post defibrillator in 10/2011 in [place], [place]. Patient has a history of anxiety, depression, and cocaine abuse. The patient reports that he was taking his medications until the last 2 to 3 days at which time he stopped taking medications, stopped eating and smoked $1200 worth of cocaine. This morning the patient experienced substernal chest discomfort, presented to [facility] Emergency Room where EKG did not reveal any acute ischemic ST-T wave changes. Initial troponin was borderline elevated at 0.53. Patient is currently chest pain free.   PAST MEDICAL HISTORY:  1. Status post coronary artery bypass graft surgery x6 [facility].  2. Ischemic cardiomyopathy status post implantable cardiac defibrillator 10/2011 in [place], [place]. 3. Congestive heart failure.  4. Diabetes. 5. Chronic obstructive pulmonary disease.  6. Cocaine abuse.   MEDICATIONS: Patient has not been taking medication for the past 3 to 4 days. Medications listed are:  1. Aspirin 1 daily.  2. Carvedilol 12.5 mg b.i.d.  3. Spironolactone 25 mg daily.  4. Benazepril 10 mg daily.  5. Furosemide 40 mg daily.  6. Glipizide 10 mg daily.  7. Metformin 1000 mg b.i.d.   SOCIAL HISTORY: Patient is single. He smokes a pack of cigarettes a day and abuses cocaine.   FAMILY HISTORY: Positive for  coronary artery disease.   REVIEW OF SYSTEMS: CONSTITUTIONAL: No fever or chills. EYES: No blurry vision. EARS: No hearing loss. RESPIRATORY: Patient does have shortness of breath due to chronic obstructive pulmonary disease. CARDIOVASCULAR: Chest discomfort as described above. GASTROINTESTINAL: No nausea, vomiting, diarrhea, constipation. GENITOURINARY: No dysuria, hematuria. ENDOCRINE: No polyuria or polydipsia. MUSCULOSKELETAL: No arthralgias or myalgias. NEUROLOGICAL: No focal muscle weakness or numbness. PSYCHOLOGICAL: Patient has depression and anxiety.   PHYSICAL EXAMINATION:  VITAL SIGNS: Blood pressure 104/72, pulse 92, respirations 20, temperature 98.3, pulse oximetry 94%.   HEENT: Pupils equal, reactive to light and accommodation.   NECK: Supple without thyromegaly.   LUNGS: Clear.   CARDIOVASCULAR: Normal jugular venous pressure. Normal point of maximal impulse. Regular rate and rhythm. Normal S1, S2. No appreciable gallop, murmur, rub.   ABDOMEN: Soft and nontender. Pulses were intact bilaterally.   MUSCULOSKELETAL: Normal muscle tone.   NEUROLOGIC: Patient is alert and oriented x3. Motor and sensory both grossly intact.   IMPRESSION: 59 year old gentleman with known coronary artery disease, ischemic cardiomyopathy status post bypass graft surgery and implantable cardiac defibrillator who failed to take his medications and went on a cocaine binge for three days presents with chest pain with elevated troponin, currently chest pain free without EKG changes.   RECOMMENDATIONS:  1. Agree with overall current therapy.  2. Would continue enoxaparin for 48 to 72 hours.  3. Resume current cardiac medications. 4. Review 2-D echocardiogram.  5. Initially would pursue conservative management in light of patient's extenuating circumstances with recent cocaine binge. If patient has recurrent chest pain despite appropriate medications then would consider cardiac  catheterization.  ____________________________  Isaias Cowman, MD ap:cms D: 09/06/2012 15:40:54 ET T: 09/06/2012 16:07:39 ET JOB#: [id number]  cc: Isaias Cowman, MD, <Dictator> Isaias Cowman MD ELECTRONICALLY SIGNED 09/15/2012 14:28

## 2015-01-21 NOTE — Discharge Summary (Signed)
PATIENT NAME:  Ronald Miller, Ronald Miller Ronald#:  [id number] DATE OF BIRTH:  1956-05-12  DATE OF ADMISSION:  09/06/2012 DATE OF DISCHARGE:  09/08/2012  ADMITTING PHYSICIAN: Ceasar Lund. Anselm Jungling, MD  DISCHARGING PHYSICIAN: Gladstone Lighter, MD  PRIMARY PHYSICIAN:  None.  CONSULTATIONS IN THE HOSPITAL: 1. Cardiology consultation with Dr Saralyn Pilar.  2. Psychiatric consultation by Dr Weber Cooks.   DISCHARGE DIAGNOSES:  1. Acute subendocardial infarction secondary to coronary vasospasm from cocaine abuse.  2. Depression.  3. Cocaine abuse.  4. Coronary artery disease status post bypass graft surgery.  5. Congestive heart failure with ejection fraction of 35% status post automatic implantable cardiac defibrillator.  6. Diabetes mellitus. 7. Low normal blood pressure.   DISCHARGE MEDICATIONS:  1. Multivitamin 1 tablet p.o. daily.  2. Metformin 1000 mg p.o. b.i.d.  3. Spironolactone 12.5 mg p.o. daily.  4. Benazepril  5 mg p.o. daily.  5. Glipizide 10 mg p.o. daily.  6. Lasix 20 mg p.o. daily.  7. Celexa 20 mg p.o. daily.  8. Simvastatin 20 mg p.o. daily.  9. Aspirin 325 mg p.o. daily.  10. The patient was advised to hold the following medications: Carvedilol 12.5 mg p.o. b.i.d. due to cocaine use at this time.  DISCHARGE DIET: Low sodium, ADA diet.   DISCHARGE ACTIVITY: As tolerated.    FOLLOWUP INSTRUCTIONS:  1. PCP followup in 1 to 2 weeks.  2. Follow up with psychiatrist as recommended.  3. Stop Coreg if any chance of taking cocaine again, may restart after being  seen by primary care physician.  LABS AND IMAGING STUDIES:  1. Urine tox screen on admission was positive for cocaine. Urinalysis negative for any infection. Alcohol level was negative, first set of troponin was 0.64. It improved down to 0.27 at the time of discharge.  2. Echo Doppler showing moderate global hypokinesis of left ventricle, ejection fraction 35%, mild mitral regurgitation, and mild tricuspid regurgitation is  present.  3. Hba1c is 8.4, LDL 58, HDL 23, total cholesterol 124, triglycerides 253. WBC 5.2, hemoglobin 13.1 hematocrit 39.5, platelet count 126, sodium 135, potassium 3.7, chloride 103, bicarbonate 27, BUN 15, creatinine 0.92, glucose 327, calcium 8.0. INR was 0.9.   BRIEF HOSPITAL COURSE: Ronald Miller is a 59 year old male with past medical history significant for coronary artery disease status post bypass graft surgery, congestive heart failure, status post AICD placed, chronic smoker and drug abuse, history of depression and anxiety with prior Behavioral Medicine admission in 2010. Came into the hospital secondary to chest pain after he smoked cocaine worth $1200. He said his depression relapsed and he was very stressed out and did this thing. He was initially involuntarily committed, admitted to telemetry and was on suicide precautions.   1. Acute subendocardial injury due to coronary vasospasm from cocaine use.  Seen by cardiologist, Dr Saralyn Pilar. Because of no blockages, this is purely from cocaine use. He did not recommend any further invasive procedures. The patient was placed on therapeutic doses of Lovenox for 48 hours and had to remain chest pain free during the hospital stay. He was on nitroglycerin. His beta blockers were stopped because of the cocaine contraindication. His all his other medications were continued. Troponins trended down, and he is medically ready for discharge.  2. Depression with cocaine abuse. No suicidal intention. He was involuntarily committed and placed on suicide precautions. He was seen by Dr Weber Cooks from Psychiatry. Eventually the patient was willing to go down  to Behavioral Medicine unit for depression  treatment but the  next day he felt much better. He had a lucid plan per Dr. Weber Cooks. He took the patient off of any commitment and per patient he also gave contact information for [facility]. The patient actually had a bed at [place] to be discharged but the patient did not want  to go and per Dr. Weber Cooks, the patient is okay to be discharged home from a depression point of view. 3. Congestive heart failure with ejection fraction 35% status post automatic implantable cardiac defibrillator. His home medications were resumed at a lower dose. Aldactone and benazepril doses were reduced. He is on Lasix, and beta blockers were stopped. He is also on aspirin and statin. 4. Coronary artery disease status post bypass graft surgery; again medical management, avoid cocaine. Seen by cardiology during this admission Dr. Saralyn Pilar recommended putting the patient on aspirin and stop Plavix due to noncompliance issue.  5. Diabetes mellitus, uncontrolled. Medications were resumed and importance of being compliant to medications was explained to the patient. His course has been otherwise uneventful in the hospital.   DISCHARGE CONDITION: Stable.   DISCHARGE DISPOSITION: Home.   Time Spent ON discharge: 45 minutes.    ____________________________ Gladstone Lighter, MD rk:ljs D: 09/08/2012 15:16:41 ET T: 09/09/2012 09:55:21 ET JOB#: [id number]  cc: Gladstone Lighter, MD, <Dictator> Ceasar Lund. Anselm Jungling, MD Gladstone Lighter MD ELECTRONICALLY SIGNED 09/10/2012 13:55

## 2015-01-21 NOTE — Consult Note (Signed)
Details:    - Psychiatry: Patient seen. He is awake and alert. Affect blunted but not tearful. Mood feel disappointed. Denies having any suicidal ideation or wish to die. No evident psychosis. Patient was advised that a bed could be availible today for [facility] in [place] and that it was reccomended as a way to improve chances for sobriety. Patyient understands this but declines offer.Has a plan to have his sponser take him to [place] to stay with a girlfriend. Says he can stay with mother tonight. Supportive and educational therapy done about SA and relapse. Urge him to continue trial of celexa 20mg  a day for depression and anxiety and follow up with a doctor locally if he leaves the area or call [facility] if he doesn't . Patient agrees to plan. No need to transfer to [facility].   Electronic Signatures: Gonzella Lex (MD)  (Signed 06-Dec-13 11:02)  Authored: Details   Last Updated: 06-Dec-13 11:02 by Gonzella Lex (MD)

## 2015-01-21 NOTE — Consult Note (Signed)
PATIENT NAME:  Ronald Miller, Ronald Miller MR#:  [id number] DATE OF BIRTH:  1955/10/24  DATE OF CONSULTATION:  09/08/2012  REFERRING PHYSICIAN:   CONSULTING PHYSICIAN:  Gonzella Lex, MD  IDENTIFYING INFORMATION AND REASON FOR CONSULT: The patient is a 59 year old man who came to the Emergency Room after binging on cocaine reporting chest pain, also at that time reporting depression and having some suicidal-like thinking. The patient was admitted to the medical service on telemetry for chest pain. Consult for evaluation of treatment for substance abuse and depression.   HISTORY OF PRESENT ILLNESS: Information obtained from the patient and from the chart. The patient reports that he had been doing well as far as his recovery until about a week ago at which time he binged on cocaine. He smoked up about 1200 dollars worth of cocaine in the last couple of days, thereby using up all of his money for his rent and living expenses. After this he became distraught and depressed and began having suicidal thoughts, feeling hopeless. Prior to that he indicates that he thinks he has probably been leading up to this relapse for a while having been more depressed and more bored and feeling more hopeless about his situation. He denies that he has engaged in any suicidal behavior. Denies that he had been drinking or abusing any other drugs. The patient is not currently receiving any outpatient psychiatric treatment.   PAST PSYCHIATRIC HISTORY: The patient has a prior history of substance abuse particularly with cocaine. He had been admitted to the hospital at our facility in September of 2010 under similar circumstances. He was discharged to [facility] in [place]. He was to follow-up with [facility]. He was not taking any psychiatric medication at that time. The patient indicates that he found [place] to be a helpful intervention and that he had maintained sobriety since then. The patient indicates that  he has had problems with depression in the past but denies any history of suicide attempts. He has never taken antidepressant medication in the past having declined it when it was offered before. Denies any history of psychotic symptoms. Denies any history that would sound like bipolar disorder.   PAST MEDICAL HISTORY: The patient has history of non-insulin-dependent diabetes and high blood pressure. 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 He has had a pacemaker placed since then. He has been unable to find work recently. 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 The patient is living by himself. He does have some family in the area but generally sees himself as being fairly lonely. He says he gets depressed every day when he has to stay at home and his family goes into work.   PAST MEDICAL HISTORY: History of hypertension, diabetes, implanted pacemaker and defibrillator. Prior history of MI and appears to have had a myocardial infarction this time as well.   FAMILY HISTORY: Does not know of any family history of mental health problems.   CURRENT MEDICATIONS:  1. Spironolactone 25 mg once a day.  2. Metformin 1000 mg twice a day. 3. Glipizide 10 mg once a day. 4. Fluticasone 40 mg oral once a day. 5. Carvedilol 12.5 mg twice a day. 6. Benazepril 10 mg once a day.  ALLERGIES: Codeine.   REVIEW OF SYSTEMS: The patient is complaining of feeling depressed. He is still having some discomfort in his chest. Feeling some shortness of breath. Feels anxious. Has a lot of negative thoughts about himself. He is currently denying any suicidal ideation. Says that he never really had any serious wish to die. Denies any psychotic symptoms or hallucinations. Still does feel tired and run down. Has some  hopelessness.   MENTAL STATUS EXAM: Overweight man who looks his stated age, interviewed in a hospital room. He is cooperative and compliant with the interview. Eye contact is intermittent. Psychomotor activity normal. Speech is normal in rate, tone, and volume. Affect is blunted and dysphoric. Mood is stated as being bad. Thoughts are lucid without any obvious delusions or loosening of associations. The patient denies any current suicidal or homicidal ideation but does endorse a general sense of hopelessness. The patient's baseline intelligence is normal. Short and long-term memory appear to be intact. He is alert and oriented x4. Has fairly good current judgment and insight.   ASSESSMENT: This is a 59 year old man with history of cocaine dependence and depression which may be more or less directly related to his substance abuse problem. Recently relapsed on cocaine. Now has severe psychosocial problems and is feeling more depressed, currently on the medical service having had what appears to have been a rule in for a myocardial infarction. The patient does still have symptoms of depression and is wanting to go to the Inpatient Psychiatry Unit for treatment of his depression, stabilization, and to consider options to resume sobriety.   TREATMENT PLAN:  1. Review current and past history.  2. Review chart.  3. Review labs.  4. Supportive and educational therapy done with the patient.  5. I am going to go ahead and order citalopram 20 mg per day for treatment of depression. Side effects were discussed with the patient and he agrees to this.  6. I agree that it would be appropriate for him to come to the [facility] for treatment after he is cleared on the medical floor.  7. At this point I do not think he needs a full-time sitter in the room. He is not expressing suicidal ideation, he is not psychotic, and he is lucid and appropriate to his behavior. It appears that the current rule is that a  person has to have a sitter if they are under involuntary commitment. I have gone ahead and discontinued the commitment paperwork at this point.  8. After my initial meeting with the patient, I discovered that there actually is a bed at the [facility] available for him as early as tomorrow. My understanding of the medical situation would be that he is probably not ready to be transferred to [facility] tomorrow. I think we can count on him coming to [facility] and then reapply to [facility] in hopes of having him go there next week.   DIAGNOSIS PRINCIPLE AND PRIMARY:  AXIS I: Cocaine dependence.   SECONDARY DIAGNOSES:  AXIS I: Depression, not otherwise specified.   AXIS II: Deferred.   AXIS III:  1. Myocardial infarction. 2. Hypertension. 3. Diabetes. 4. Obesity. 5. Chronic obstructive pulmonary disease. 6. Seasonal allergies.   AXIS IV: Severe stress from spending all of his money on cocaine and serious financial problems as well as chronic social impairment.   AXIS V: Functioning at time of evaluation is 52.   ____________________________ Gonzella Lex, MD jtc:drc D: 09/08/2012  00:22:23 ET T: 09/08/2012 07:57:34 ET JOB#: [id number]  cc: Gonzella Lex, MD, <Dictator> Gonzella Lex MD ELECTRONICALLY SIGNED 09/08/2012 15:28

## 2015-01-24 NOTE — H&P (Signed)
PATIENT NAME:  Ronald Miller, Ronald Miller MR#:  [id number] DATE OF BIRTH:  May 10, 1956  DATE OF ADMISSION:  08/14/2013  REFERRING PHYSICIAN: Eula Listen, MD  PRIMARY CARE PHYSICIAN: None.   CHIEF COMPLAINT: Shortness of breath.   HISTORY OF PRESENT ILLNESS: The patient is pleasant 59 year old male with history of MI, status post defibrillator for systolic CHF, EF of 62% per echo from last December, who presents for shortness of breath. The patient has been having shortness of breath for about a week with cough, congestion. The cough is mostly dry, occasionally productive. He has dyspnea on exertion and some orthopnea. He has had no fevers, chills or sick contacts. He came into the hospital, where he was noted to have CHF.  He has been hypoxic with a room air O2 sat as low as 88%. He was given a dose of Lasix and some nebs, and hospitalist service was contacted for further evaluation and management.   PAST MEDICAL HISTORY: History of CAD, status post MI, status post CABG x 6. History of chronic systolic CHF, EF of 83%; status post cardiac defibrillator, diabetes, ongoing tobacco, COPD, anxiety, depression, history of back surgery.   ALLERGIES: CODEINE.   SOCIAL HISTORY: He smokes 1/2 pack of cigarettes. No alcohol. No recent drug use but has history of cocaine, last used in January he states.   FAMILY HISTORY: Positive for cardiac history both parents' side of the family. He has a dad with diabetes. Grandmother had lung cancer and brain cancer.   OUTPATIENT MEDICATIONS: The patient has not taken most of his medications for about a month as he has had no doctor followups. He has run out of his medications. Of note, he is supposed to be on aspirin 81 mg daily, benazepril 10 mg daily, carvedilol 12.5 mg 2 times a day, Lasix 40 mg once a day, glipizide 10 mg once a day, metformin 1000 mg 2 times a day, vitamin 1 tab once a day, spironolactone 25 mg daily.   REVIEW OF SYSTEMS: CONSTITUTIONAL: No fever,  positive fatigue and weakness. Is trying to  intentionally lose weight.  EYES: No blurry vision or double vision.  ENT: No tinnitus or hearing loss.  RESPIRATORY: Positive for cough, wheezing and shortness of breath and dyspnea on exertion. Has a history of COPD.   CARDIOVASCULAR: No chest pain. Has swelling in the legs, 2-pillow orthopnea. No arrhythmia or palpitations.  GASTROINTESTINAL: Has abdominal pain from all the coughing he has had. No nausea, vomiting, diarrhea, abdominal pain, constipation or bloody or dark stools.  GENITOURINARY: Denies dysuria, hematuria.  HEMATOLOGIC AND LYMPHATIC: No anemia or easy bruising.  SKIN: No rashes.  MUSCULOSKELETAL: Denies arthritis or gout.  NEUROLOGICAL:  Denies focal weakness or numbness.  PSYCHIATRIC: Has anxiety and depression.   PHYSICAL EXAMINATION: VITAL SIGNS: Temperature on arrival 97.3, pulse rate 100, respiratory rate 18, blood pressure 131/89, O2 sat has been 88% on room air at last 1, initially was 92% on room air.  GENERAL: The patient is a pleasant, obese male laying in bed, no obvious distress.  HEENT: Normocephalic, atraumatic. Pupils are equal and reactive. Anicteric sclerae. Extraocular muscles intact. Moist mucous membranes.  NECK: Supple. No thyroid tenderness. No cervical lymphadenopathy.  CARDIOVASCULAR: S1, S2, irregularly irregular. No significant murmurs, rubs or gallops. Defibrillator palpable left upper chest.  ABDOMEN: Soft, nontender, nondistended. Positive bowel sounds in all quadrants.  LUNGS: The patient has basilar crackles. No significant wheezing. Good air entry otherwise.  EXTREMITIES: Some chronic venous stasis changes but no  lower extremity edema significantly.  NEUROLOGIC: Cranial nerves II through XII grossly intact. Strength is 5 out of 5 in all extremities. Sensation is intact to light touch.  PSYCHIATRIC: Awake, alert, oriented x 3. Pleasant, cooperative.   LABORATORIES , DIAGNOSTIC AND IMAGING: BNP 2617.  BUN 11, creatinine 0.86, sodium 137, potassium 4.1. LFTs within normal limits. Troponin negative. WBC 8.1, hemoglobin 14.9, platelets were 146. EKG: A paced rhythm. Chest x-ray, PA and lateral, showing mild to moderate CHF, basilar atelectasis, stable cardiomegaly.   ASSESSMENT AND PLAN: We have a 59 year old male with history of chronic systolic congestive heart failure, status post defibrillator, chronic obstructive pulmonary disease, ongoing tobacco abuse, who presents with shortness of breath for about a week, cough mostly nonproductive without any fevers or chills and evidence for congestive heart failure.  He also has acute respiratory failure with hypoxemia on arrival. At this point, would admit the patient to the hospital. I believe his acute respiratory failure is secondary to acute on chronic systolic congestive heart failure and a mild chronic obstructive pulmonary disease exacerbation. At this point, I would  go ahead and start him on Lasix IV b.i.d., continue his ACE inhibitor, beta blocker and start him on nitro patch. Would monitor the ins and outs. Would diurese him and repeat another echocardiogram as there is no recent echo and he has not taken his medications for a while. He stated that he does not follow with a PCP or cardiologist. He should be followed up with [facility] as he is living in [place] now. In regards to his mild chronic obstructive pulmonary disease flare, I would go ahead and start him on some Levaquin for a few days and some nebs, oxygen. I would defer on steroids at this point as he did not have any significant wheezing today for me. I would put in a sputum culture as well. In regards to his diabetes, I will check a hemoglobin A1c, start him on sliding scale insulin. I would cycle the troponins, continue the aspirin and beta blocker for his history of coronary artery disease. He has no chest pains and had a negative troponin. In regards to his tobacco abuse, he was  counseled for 3 minutes by me. He states that this is the only good thing in his life left. I do not know if he is going to quit but he should be counseled further I will start him on a patch now. I will start him on deep vein prophylaxis with Lovenox.   TOTAL TIME SPENT: 50 minutes.   CODE STATUS: The patient is full code.     ____________________________ Vivien Presto, MD sa:cs D: 08/14/2013 14:48:11 ET T: 08/14/2013 15:05:26 ET JOB#: [id number]  cc: Vivien Presto, MD, <Dictator> Vivien Presto MD ELECTRONICALLY SIGNED 08/23/2013 7:05

## 2015-01-24 NOTE — Discharge Summary (Signed)
PATIENT NAME:  LI, FRAGOSO MR#:  [id number] DATE OF BIRTH:  10-18-1955  DATE OF ADMISSION:  08/14/2013 DATE OF DISCHARGE:  08/15/2013   PRIMARY CARE PHYSICIAN: None.   DISCHARGE DIAGNOSES:  1.  Acute respiratory failure.  2.  Acute-on-chronic systolic congestive heart failure with ejection fraction of 30% to 35%.  3.  Cocaine abuse.  4.  Tobacco abuse.  5.  Diabetes mellitus, type 2.   IMAGING STUDIES DONE: Include a chest x-ray which showed bilateral pulmonary edema. Repeat chest x-ray on the day of discharge showed improving pulmonary edema.   IMAGING STUDIES: Echocardiogram showed ejection fraction of 30% to 35%.   ADMITTING HISTORY AND PHYSICAL: Please see detailed H and P dictated by Dr. Bridgette Habermann. In brief, a 59 year old male patient with history of chronic systolic CHF with EF of 26% to 35%, noncompliant with medications, tobacco abuse, cocaine abuse, who presented to the hospital with shortness of breath, lower extremity edema.   HOSPITAL COURSE: 1.  Acute-on-chronic systolic CHF. The patient was started on IV Lasix b.i.d. along with beta blockers and lisinopril with which he diuresed well. By the day of discharge, the patient is ambulating well and on room air is saturating 93%. Does not have any cough, PND, edema, and will be started on Lasix at home and discharged in a fair condition. The patient will be set up with [facility] for followup. He has been advised to reduce the oral salt intake and fluids daily, less than 2 liters.  2.  He was counseled to quit cocaine and smoking.   DISCHARGE DIAGNOSES:  1.  Lasix 40 mg oral twice a day for 4 days followed by once a day.  2.  Benazepril 5 mg oral daily.  3.  Coreg 3.125 mg oral b.i.d.  4.  Glipizide 10 mg oral b.i.d.  5.  Aspirin 81 mg daily.  6.  Multivitamin once a day.  7.  Metformin 1000 mg oral b.i.d.  8.  Nicotine patch 14 mg every day.  9.  ProAir HFA 2 puffs inhaled every 4 hours as needed for shortness of  breath, wheezing.   DISCHARGE INSTRUCTIONS: Low-sodium, carbohydrate-controlled diet. Activity as tolerated. Follow up with [facility] in 1 to 2 weeks. Low-salt diet. Daily fluids less than 2 liters.   TIME SPENT ON DAY OF DISCHARGE IN DISCHARGE ACTIVITY: Was 40 minutes.   ____________________________ Leia Alf Jamarea Selner, MD srs:np D: 08/15/2013 14:09:59 ET T: 08/15/2013 15:53:50 ET JOB#: [id number]  cc: Alveta Heimlich R. Kayin Kettering, MD, <Dictator> [facility] Neita Carp MD ELECTRONICALLY SIGNED 08/20/2013 20:45

## 2015-01-25 NOTE — H&P (Signed)
PATIENT NAME:  Ronald Miller, GAERTNER MR#:  [id number] DATE OF BIRTH:  [birth year]  DATE OF ADMISSION:  03/29/2014  REFERRING PHYSICIAN: Dr. Benjaman Lobe.  PRIMARY CARE PHYSICIAN: [facility].   PRIMARY CARDIOLOGIST: The patient has not seen a cardiologist yet, but he is supposed to start following with Dr. Nehemiah Massed. He has an appointment scheduled for next month.   CHIEF COMPLAINT: Chest tightness and shortness of breath.   HISTORY OF PRESENT ILLNESS: This is a 59 year old male with known history of coronary artery disease, status post CABG x 6, congestive heart failure with known EF of 35% from echocardiogram of last year. The patient is status post defibrillator for systolic CHF, and a history of diabetes, recently stopped tobacco abuse. Known history of occasional cocaine abuse. The patient presents with multiple complaints, mainly chest tightness. Reports is intermittent, provoked by exertion. Reports it is nonradiating, accompanied by sweating, denies any nausea, as well reports shortness of breath, and paroxysmal  nocturnal dyspnea. As well, dyspnea on exertion. The patient reports weight gain of 19 pounds over last few weeks. Reports he has been compliant with his medication. The patient was in ED recently before 2 days complaining of shortness of breath and chest tightness. His troponins were negative x 2 then. He tested then positive for cocaine. The patient is saturating 93% on room air. He reports worsening lower extremity edema. The patient was given 3 baby aspirins in ED as he took 1 baby aspirin at home.   His EKG is showing a paced rhythm and his first troponin is negative. Currently he is chest pain-free.   PAST MEDICAL HISTORY:  1. Coronary artery disease, status post MI and CABG x 6.  2. Chronic systolic CHF, EF of 32%.  3. Status post cardiac defibrillator.  4. Diabetes.  5. Tobacco abuse. He quit smoking 4 days ago.  6. History of COPD.  7. Anxiety.  8. Depression.  9. History of  back surgery.  10. History of cocaine use.   ALLERGIES: CODEINE.   SOCIAL HISTORY: The patient reports he quit smoking 4 days ago. No alcohol abuse. Has a history of cocaine use. He snorts cocaine. Reports most recently was earlier during the week.   FAMILY HISTORY: Significant for cardiac history in both parents as well as diabetes.   HOME MEDICATIONS:  1. Aspirin 81 mg oral daily.  2. Benazepril 5 mg oral daily.  3. Glipizide 10 mg oral 2 times a day.  4. Metformin 1 gram oral 2 times a day.  5. Coreg 3.125 mg oral 2 times a day.  6. Albuterol/ipratropium 4 times a day as needed.  7. Albuterol as needed.  8. Lasix 40 mg daily.  9. Multivitamin 1 tablet oral daily.   REVIEW OF SYSTEMS:  GENERAL: Denies fever, chills, fatigue, weakness, reports 19-pound weight gain.  EYES: Denies blurry vision, double vision, inflammation.  ENT: Denies tinnitus, ear pain, hearing loss, epistaxis.  RESPIRATORY: Denies cough, wheezing, hemoptysis. Reports shortness of breath and COPD.  CARDIOVASCULAR: Denies syncope, palpitation. Reports worsening edema and exertional chest pain.  GASTROINTESTINAL: Denies nausea, vomiting, diarrhea, abdominal pain, hematemesis.  GENITOURINARY: Denies dysuria, hematuria, or renal colic.  ENDOCRINE: Denies polyuria, polydipsia, heat or cold intolerance.  HEMATOLOGY: Denies anemia, easy bruising, bleeding diathesis.  INTEGUMENT: Denies acne, rash, or skin lesion.  MUSCULOSKELETAL: Denies any gout, cramps, arthritis.  NEUROLOGIC: Denies CVA, TIA, dementia, headache, vertigo.  PSYCHIATRIC: Denies anxiety, insomnia. Reports history of depression.   PHYSICAL EXAMINATION:  VITAL SIGNS: Temperature 98.1,  pulse 86, respiratory rate 18, blood pressure 108/82, saturating 97% on room air.  GENERAL: Well-nourished male, looks comfortable in bed, in no apparent distress.  HEENT: Head atraumatic, normocephalic. Pupils equal, reactive to light. Pink conjunctivae. Anicteric  sclerae. Moist oral mucosa.  NECK: Supple. No thyromegaly. No JVD.  CHEST: Good air entry bilaterally. No wheezing, rales, or rhonchi.  CARDIOVASCULAR: S1, S2 heard. No rubs, murmurs, or gallops.  ABDOMEN: Soft, nontender, nondistended. Bowel sounds present.  EXTREMITIES: No edema. No clubbing. No cyanosis. Pedal and radial pulses felt bilaterally.  PSYCHIATRIC: Appropriate affect. Awake, alert x 3. Intact judgment and insight.  NEUROLOGIC: Cranial nerves grossly intact. Motor 5/5. No focal deficits.  MUSCULOSKELETAL: No joint effusion or erythema.  SKIN: Warm and dry. Normal skin turgor.   PERTINENT LABORATORIES: Glucose 237, BUN 18, creatinine of 1.05, sodium 139, potassium 3.7, chloride 105, CO2 of 28, ALT 40, AST 24, alkaline phosphatase 82. Troponin less than 0.02, white blood cell 8.2, hemoglobin 13.4, hematocrit 40.9, platelets 130,000.   EKG showing atrial paced rhythm with 90 beats per minute.   ASSESSMENT AND PLAN:  1. Chest pain, currently resolved. The patient's first troponin is negative. He was given total of 324 mg of aspirin. We will start him on Nitro paste, sublingual nitroglycerin. Will be admitted to telemetry unit. We will continue to cycle his cardiac enzymes and follow the trend. His pain is most likely exacerbated by his recent cocaine use. We will repeat another drug screen as well. Will consult cardiology, Dr. Nehemiah Massed, to see if any further workup is indicated at this point.  2. Congestive heart failure, known history of systolic congestive heart failure, the patient appears to be in acute systolic congestive heart failure. We will check x-ray. We will check BNP. The patient reports 19-pound weight gain and paroxysmal nocturnal dyspnea as well, so he will be started on IV Lasix 40 mg every 12 hours. Will check 2D echocardiogram on the patient. We will continue to cycle his cardiac enzymes. He is already on benazepril and beta blockers. Will repeat his echo and will consult  cardiologist service.  3. Diabetes mellitus. We will continue the patient on glipizide. Will add insulin sliding scale.  4. Cocaine abuse. Patient was counseled.  5. Tobacco abuse: The patient reports he quit smoking 4 days ago.  6. History of chronic obstructive pulmonary disease. The patient has no active wheezing so will continue him on DuoNeb every 4 hours and p.r.n. albuterol.  7. Deep vein thrombosis prophylaxis, subcutaneous heparin.  CODE STATUS: The patient is full code.   TOTAL TIME SPENT ON ADMISSION AND PATIENT CARE: 55 minutes.    ____________________________ Albertine Patricia, MD dse:lt D: 03/29/2014 22:47:45 ET T: 03/30/2014 04:20:23 ET JOB#: [id number]  cc: Albertine Patricia, MD, <Dictator> [facility] Graciela Husbands MD ELECTRONICALLY SIGNED 03/31/2014 23:27

## 2015-01-25 NOTE — Discharge Summary (Signed)
PATIENT NAME:  Ronald Miller, Ronald Miller MR#:  [id number] DATE OF BIRTH:  04/13/1956  DATE OF ADMISSION:  03/29/2014 DATE OF DISCHARGE:  03/31/2014  PRIMARY CARE PHYSICIAN: [facility]   DISCHARGE DIAGNOSES: 1.  Acute on chronic systolic heart failure.  2.  Coronary artery disease.  3.  Diabetes mellitus type 2.  4.  Chest pain secondary to cocaine use.   DISCHARGE MEDICATIONS: 1.  Aspirin 81 mg p.o. daily. 2.  Glipizide 10 mg p.o. b.i.Miller.  3.  Metformin 1 gram p.o. b.i.Miller.  4.  Benazepril 5 mg p.o. daily.  5.  Coreg 3.125 mg p.o. b.i.Miller.  6.  Combivent 1 puff 4 times daily as needed for wheezing.  7.  Lasix 40 mg p.o. b.i.Miller. (Lasix is increased from 40 mg from once a day to twice a day)   DIET: Low-sodium, ADA diet.   CONSULTATIONS: Cardiology with Dr. Kowalski.   HOSPITAL COURSE: 1.  Acute on chronic systolic heart failure. This is a 58-year-old male patient with history of coronary artery disease status post CABG and also history of chronic systolic heart failure with EF 35%, status post defibrillator, diabetes mellitus type 2, who comes in because of chest pain. The patient also has trouble breathing. The patient was admitted to telemetry for chest pain evaluation. Troponins have been negative. The patient was continued on aspirin, nitroglycerin, beta blockers, and statins. The patient reported to have 19 pound weight gain over the past few weeks. The patient's EKG showed a paced rhythm at 90 beats per minute. The patient was given IV Lasix 40 mg b.i.Miller. The patient was seen by Dr. Kowalski who recommended continuing diuretics and no further work-up. The patient had echocardiogram done, which showed EF of 35% with decreased LV function. The patient's troponins have been negative x3 and we discharged him home with extra dose of Lasix. The patient will see Dr. Kowalski next week.  The patient has cocaine abuse. His urine toxicology was negative on admission, but he mentioned that he used cocaine the  day before he came to the hospital. The patient's chest pain thought to be secondary to cocaine use rather than acute coronary syndrome. The patient's CBC and met-B were within normal limits. Troponins have been negative. Discharge vitals are blood pressure around 105/69, temperature 98.6, heart rate 81 and oxygen saturation 96% on room air. The patient was continued on his inhalers for COPD and also his metformin and glipizide for his diabetes. 2.  Chronic systolic heart failure, which is acute on chronic. He is already on Coreg and benazepril. He was on an extra dose of Lasix while he was here.   The patient will have outpatient functional study.  TIME SPENT ON DISCHARGE: More than 30 minutes.  ____________________________  , MD sk:sb Miller: 04/04/2014 09:59:56 ET T: 04/04/2014 13:53:32 ET JOB#: [id number]  cc:  , MD, <Dictator>   MD ELECTRONICALLY SIGNED 04/18/2014 18:39 

## 2015-01-25 NOTE — Consult Note (Signed)
PATIENT NAME:  Ronald Miller, Ronald Miller MR#:  [id number] DATE OF BIRTH:  1956-04-17  DATE OF CONSULTATION:  03/30/2014  REFERRING PHYSICIAN:   CONSULTING PHYSICIAN:  Isaias Cowman, MD  PRIMARY PHYSICIAN:  [facility].  CHIEF COMPLAINT: Shortness of breath.   HISTORY OF PRESENT ILLNESS: The patient is a 59 year old gentleman with history of coronary artery disease, status post CABG x 6 with ischemic cardiomyopathy, status post defibrillator. The patient underwent coronary artery bypass graft surgery in 1999 at [facility]. The patient has known ischemic cardiomyopathy with LV ejection fraction less than 35%. The patient recently has been living in [place] state and has moved back to [place]. The patient reports that approximately 3 weeks ago, he did some cocaine, subsequently developed loss of consciousness, and a shock from his defibrillator. The patient did not seek medical attention. During the past week, the patient has had progressive weight gain, peripheral edema, and increasing shortness of breath. He was seen at [facility] Room, 03/27/2014, where he received 2 doses of intravenous furosemide with diuresis, but was not admitted. The patient reports that he continued to experience peripheral edema and represented last evening with similar complaints. EKG revealed paced rhythm. The patient has ruled out for myocardial infarction by CPK, isoenzymes, and troponin. The patient denies chest pain.   PAST MEDICAL HISTORY:  1.  Status post CABG x 6 in 1999, [facility].  2.  Ischemic cardiomyopathy.  3.  Status post pacemaker defibrillator. 4.  Diabetes.  5.  COPD.  6.  Tobacco abuse. 7.  Cocaine use. 8.  Depression.   MEDICATIONS:  1.  Aspirin 81 mg daily. 2.  Benazepril  5 mg daily. 3.  Carvedilol 3.125 mg b.i.d. 4.  Furosemide 40 mg daily. 5.  Glipizide 10 mg b.i.d. 6.  Metformin 1 gram b.i.d. 7.  Albuterol/ipratropium q.i.d. p.r.n. 8.  Albuterol p.r.n. 9.   Multivitamin 1 daily.   SOCIAL HISTORY: The patient currently lives alone in a boarding house. He was smoking a pack of cigarettes a day. Denies alcohol use. He does use cocaine occasionally.   FAMILY HISTORY: Positive for coronary artery disease.   REVIEW OF SYSTEMS:  CONSTITUTIONAL: No fever or chills.  EYES: No blurry vision.  EARS: No hearing loss.  RESPIRATORY: The patient has shortness of breath as stated above.  CARDIOVASCULAR: The patient has had fluid retention, peripheral edema, and orthopnea.  GASTROINTESTINAL: No nausea, vomiting, or diarrhea.  GENITOURINARY: No dysuria or hematuria.  ENDOCRINE: No polyuria or polydipsia.  MUSCULOSKELETAL: No arthralgias or myalgias.  NEUROLOGICAL: No focal muscle weakness or numbness.  PSYCHOLOGICAL: No depression or anxiety.   PHYSICAL EXAMINATION:  VITAL SIGNS: Blood pressure 109/77, pulse 84, respirations 16, temperature 98, pulse oximetry 93%.  HEENT: Pupils equal and reactive to light and accommodation.  NECK: Supple without thyromegaly.  LUNGS: Clear.  HEART: Normal JVP. Diffuse PMI. Regular rate and rhythm. Normal S1, S2. No appreciable gallop, murmur, or rub.  ABDOMEN: Soft and nontender. Pulses were intact bilaterally. There is trace pedal edema.  MUSCULOSKELETAL: Normal muscle tone.  NEUROLOGIC: The patient is alert and oriented x 3. Motor and sensory both grossly intact.   IMPRESSION: A 59 year old gentleman with known coronary artery disease, status post coronary artery bypass grafting x 6 with ischemic cardiomyopathy, who presents with congestive heart failure and has ruled out for myocardial infarction by CPK, isoenzymes and troponin. The patient apparently used cocaine 3 weeks ago, which resulted in a delivered shocks from his defibrillator. Most of  the patient's symptoms are primarily due to current lifestyle.   RECOMMENDATIONS:  1.  Continue current medications.  2.  Would defer full dose anticoagulation.  3.  Continue  diuresis.  4.  Strongly encouraged patient to stop smoking.  5.  Strongly encouraged patient to avoid cocaine use.  6.  If patient does well with diuresis, may consider discharge for outpatient functional study and probable repeat echocardiogram.   ____________________________ Isaias Cowman, MD ap:ts D: 03/30/2014 08:59:13 ET T: 03/30/2014 13:14:24 ET JOB#: [id number]  cc: Isaias Cowman, MD, <Dictator> Isaias Cowman MD ELECTRONICALLY SIGNED 04/04/2014 7:52

## 2015-02-04 DIAGNOSIS — I5023 Acute on chronic systolic (congestive) heart failure: Secondary | ICD-10-CM | POA: Insufficient documentation

## 2015-02-04 DIAGNOSIS — I5022 Chronic systolic (congestive) heart failure: Secondary | ICD-10-CM

## 2015-02-10 ENCOUNTER — Encounter (INDEPENDENT_AMBULATORY_CARE_PROVIDER_SITE_OTHER): Payer: 59 | Admitting: Cardiovascular Disease

## 2015-04-03 ENCOUNTER — Other Ambulatory Visit (INDEPENDENT_AMBULATORY_CARE_PROVIDER_SITE_OTHER): Payer: Self-pay | Admitting: Cardiovascular Disease

## 2015-04-03 MED ORDER — FUROSEMIDE 40 MG TABLET
40.0000 mg | ORAL_TABLET | Freq: Two times a day (BID) | ORAL | Status: AC
Start: 2015-04-03 — End: ?

## 2015-04-10 ENCOUNTER — Other Ambulatory Visit (INDEPENDENT_AMBULATORY_CARE_PROVIDER_SITE_OTHER): Payer: Self-pay | Admitting: Cardiovascular Disease

## 2015-04-30 ENCOUNTER — Ambulatory Visit (INDEPENDENT_AMBULATORY_CARE_PROVIDER_SITE_OTHER): Payer: 59 | Admitting: Cardiovascular Disease

## 2015-04-30 ENCOUNTER — Encounter (INDEPENDENT_AMBULATORY_CARE_PROVIDER_SITE_OTHER): Payer: Self-pay | Admitting: Cardiovascular Disease

## 2015-04-30 VITALS — BP 95/64 | HR 82 | Ht 69.0 in | Wt 248.6 lb

## 2015-04-30 DIAGNOSIS — I10 Essential (primary) hypertension: Secondary | ICD-10-CM

## 2015-04-30 DIAGNOSIS — I255 Ischemic cardiomyopathy: Secondary | ICD-10-CM

## 2015-04-30 DIAGNOSIS — Z95810 Presence of automatic (implantable) cardiac defibrillator: Secondary | ICD-10-CM

## 2015-04-30 DIAGNOSIS — I25810 Atherosclerosis of coronary artery bypass graft(s) without angina pectoris: Secondary | ICD-10-CM

## 2015-04-30 MED ORDER — METOLAZONE 5 MG TABLET
5.0000 mg | ORAL_TABLET | Freq: Every day | ORAL | Status: AC
Start: 2015-04-30 — End: ?

## 2015-04-30 MED ORDER — POTASSIUM CHLORIDE ER 20 MEQ TABLET,EXTENDED RELEASE(PART/CRYST)
20.0000 meq | ORAL_TABLET | Freq: Every day | ORAL | Status: DC
Start: 2015-04-30 — End: 2016-01-30

## 2015-04-30 NOTE — Progress Notes (Signed)
[facility]  [address]  [place] [address]  Phone: [phone]  Fax: [phone]    Encounter Date: 04/30/2015    Patient ID:  Joshua Pennington  ONG:[id number]    DOB: 01/28/1956  Age: 59 y.o. male    Subjective:     Chief Complaint   Patient presents with    Other Primary Cardiomyopathies     HPI Joshua Pennington returns today for a followup visit.  He is quite forgetful and did not get his labs drawn or his echo done.  His providers at home asked about an echocardiogram but since it was not done I have nothing to report and will reschedule it today.  His weight seems to be variable.  On some days, he will take 40 of Lasix twice a day and some days 80.  Recently, he has been on 40 and says he does not have enough Lasix pills, so I gave him 220 mg for 3 months.  He is short of breath but does not bother to walk.  His blood sugars are variable with sometimes in the 80s.  He has not seen an endocrinologist but seems to have improved management of his diabetes from his previous visit.  He does not really have chest pain.  He has actually fainted a couple of times after a prolonged episode of coughing.  He does have mild lower leg edema and recently had shingles on the right lower leg.  His weight remains variable and he is 248 pounds today, though he says he thinks that is mildly better.  Blood pressure runs in the low 100s at home but was a little lower today.  He asked about his medication and remains on 20 of Lipitor, 5 of Lotensin, 40 of Lasix twice a day, long and short-acting insulin, metoprolol 100 mg once a day and spironolactone.        Current Outpatient Prescriptions   Medication Sig    aspirin (ECOTRIN) 81 mg Oral Tablet, Delayed Release (E.C.) Take 81 mg by mouth Once a day    atorvastatin (LIPITOR) 20 mg Oral Tablet Take 1 Tab (20 mg total) by mouth Every evening    benazepril (LOTENSIN) 5 mg Oral Tablet Take 5 mg by mouth Once a day    cyclobenzaprine (FLEXERIL) 10 mg Oral  Tablet Take 10 mg by mouth Three times a day    ergocalciferol, vitamin D2, (DRISDOL) 50,000 unit Oral Capsule Take 50,000 Int'l Units by mouth Every 7 days    escitalopram oxalate (LEXAPRO) 20 mg Oral Tablet Take 20 mg by mouth Once a day    furosemide (LASIX) 40 mg Oral Tablet Take 1 Tab (40 mg total) by mouth Twice daily Take an additional 40 mg in AM if weight up over 4 lbs    gabapentin (NEURONTIN) 300 mg Oral Capsule Take 300 mg by mouth    insulin aspart (NOVOLOG) 100 unit/mL Subcutaneous Solution by Subcutaneous route One time    insulin glargine (LANTUS) 100 unit/mL Subcutaneous injection (vial) by Subcutaneous route Every night    MetFORMIN (GLUCOPHAGE) 1,000 mg Oral Tablet Take 1,000 mg by mouth Twice daily with food    metOLazone (ZAROXOLYN) 5 mg Oral Tablet Take 1 Tab (5 mg total) by mouth Once a day Take only as needed for 4-5 lb weight gain. Take with morning Lasix one potassium    metoprolol succinate (TOPROL-XL) 100 mg Oral Tablet Sustained Release 24 hr TAKE 1 TABLET ONE TIME DAILY  MULTIVIT-MIN/FA/LYCOPEN/LUTEIN (SENTRY SENIOR ORAL) Take by mouth    Omega-3 Fatty Acids-Vitamin E 1,000 mg Oral Capsule Take 1,000 mg by mouth Twice daily    potassium chloride (K-DUR) 20 mEq Oral Tab Sust.Rel. Particle/Crystal Take 1 Tab (20 mEq total) by mouth Once a day Take with metolazone when taken for weight gain    spironolactone (ALDACTONE) 50 mg Oral Tablet Take 1 Tab (50 mg total) by mouth Once a day     Allergies   Allergen Reactions    Codeine Nausea/ Vomiting     Past Medical History   Diagnosis Date    Chronic obstructive airway disease     Congestive heart failure     Diabetes mellitus, type 2          Past Surgical History   Procedure Laterality Date    Hx coronary artery bypass graft      Hx hernia repair      Hx back surgery      Hx vasectomy           Family History   Problem Relation Age of Onset    No Known Problems Mother     No Known Problems Father          History      Substance Use Topics    Smoking status: Current Every Day Smoker    Smokeless tobacco: Not on file    Alcohol Use: Not on file       Review of Systems   Constitutional: Negative for activity change and fatigue.   Respiratory: Positive for cough and shortness of breath. Negative for chest tightness.    Cardiovascular: Positive for palpitations and leg swelling. Negative for chest pain.   Musculoskeletal: Negative for arthralgias and gait problem.   Neurological: Negative for dizziness, syncope and light-headedness.   Psychiatric/Behavioral: Positive for sleep disturbance and agitation.     Objective:   Vitals: BP 95/64 mmHg   Pulse 82   Ht 1.753 m (5\' 9" )   Wt 112.764 kg (248 lb 9.6 oz)   BMI 36.69 kg/m2    Physical Exam   Constitutional: He is oriented to person, place, and time. He appears well-developed and well-nourished.   HENT:   Head: Normocephalic.   Neck: Neck supple. No JVD present. No thyromegaly present.   Cardiovascular: Normal rate, regular rhythm, S1 normal and S2 normal.  PMI is not displaced.  Exam reveals gallop and S4. Exam reveals no distant heart sounds and no decreased pulses.    No murmur heard.  Pulmonary/Chest: Effort normal. No respiratory distress. He has no rales.   Abdominal: Soft. Bowel sounds are normal. He exhibits distension. There is no tenderness.   Musculoskeletal: He exhibits edema.   Neurological: He is alert and oriented to person, place, and time.   Skin: Skin is warm and dry.     Assessment & Plan:     ENCOUNTER DIAGNOSES     ICD-10-CM   1. Cardiomyopathy, ischemic I25.5   2. Essential hypertension I10   3. Coronary artery disease involving coronary bypass graft of native heart I25.810   4. ICD (implantable cardioverter-defibrillator), biventricular, in situ Z95.810       Orders Placed This Encounter    BASIC METABOLIC PANEL, FASTING    CBC/DIFF    MAGNESIUM    TRANSTHORACIC ECHOCARDIOGRAM - ADULT    metOLazone (ZAROXOLYN) 5 mg Oral Tablet    potassium chloride  (K-DUR) 20 mEq Oral Tab Sust.Rel. Particle/Crystal   It  is somewhat difficult to manage Joshua Pennington without appropriate followup information.  I also think compliance is a problem with his medications.  We discussed varying doses of Lasix based on weight but he is having difficulty, I believe, following the concept, though he does seem to reduce his fluid consumption.  As a result, I gave him metolazone 5 mg and potassium 20 mEq and told him to take these with his morning Lasix on days when his weight was up 4 or 5 pounds, otherwise, to continue on 40 twice a day and his usual fluid restriction.  I did send him for labs today before he left the building and will check the results of potassium and renal function.  We also rescheduled his echocardiogram to assess overall left ventricular function.  He is considering seeking care of his diabetes or followup with an endocrinologist.  I suggested Dr.Soule here but he is not sure whether he is being referred here or to [place].  In the meantime, I made no other medication changes and urged to increase exercise and continued compliance with his medicines.          Return in about 2 days (around 05/02/2015) for return visit.    Quentin Angst, MD

## 2015-05-07 ENCOUNTER — Other Ambulatory Visit (INDEPENDENT_AMBULATORY_CARE_PROVIDER_SITE_OTHER): Payer: Self-pay | Admitting: Cardiovascular Disease

## 2015-05-26 DIAGNOSIS — I517 Cardiomegaly: Secondary | ICD-10-CM

## 2015-05-26 DIAGNOSIS — I340 Nonrheumatic mitral (valve) insufficiency: Secondary | ICD-10-CM

## 2015-06-02 ENCOUNTER — Other Ambulatory Visit (INDEPENDENT_AMBULATORY_CARE_PROVIDER_SITE_OTHER): Payer: Self-pay

## 2015-06-02 DIAGNOSIS — I25810 Atherosclerosis of coronary artery bypass graft(s) without angina pectoris: Secondary | ICD-10-CM

## 2015-06-15 IMAGING — CR DG CHEST 2V
1 series · 1 of 1 positions shown · non-contrast
Comparison: 08/15/2013

CLINICAL DATA: Shortness of Breath

EXAM:
CHEST  2 VIEW

[w chest pa]
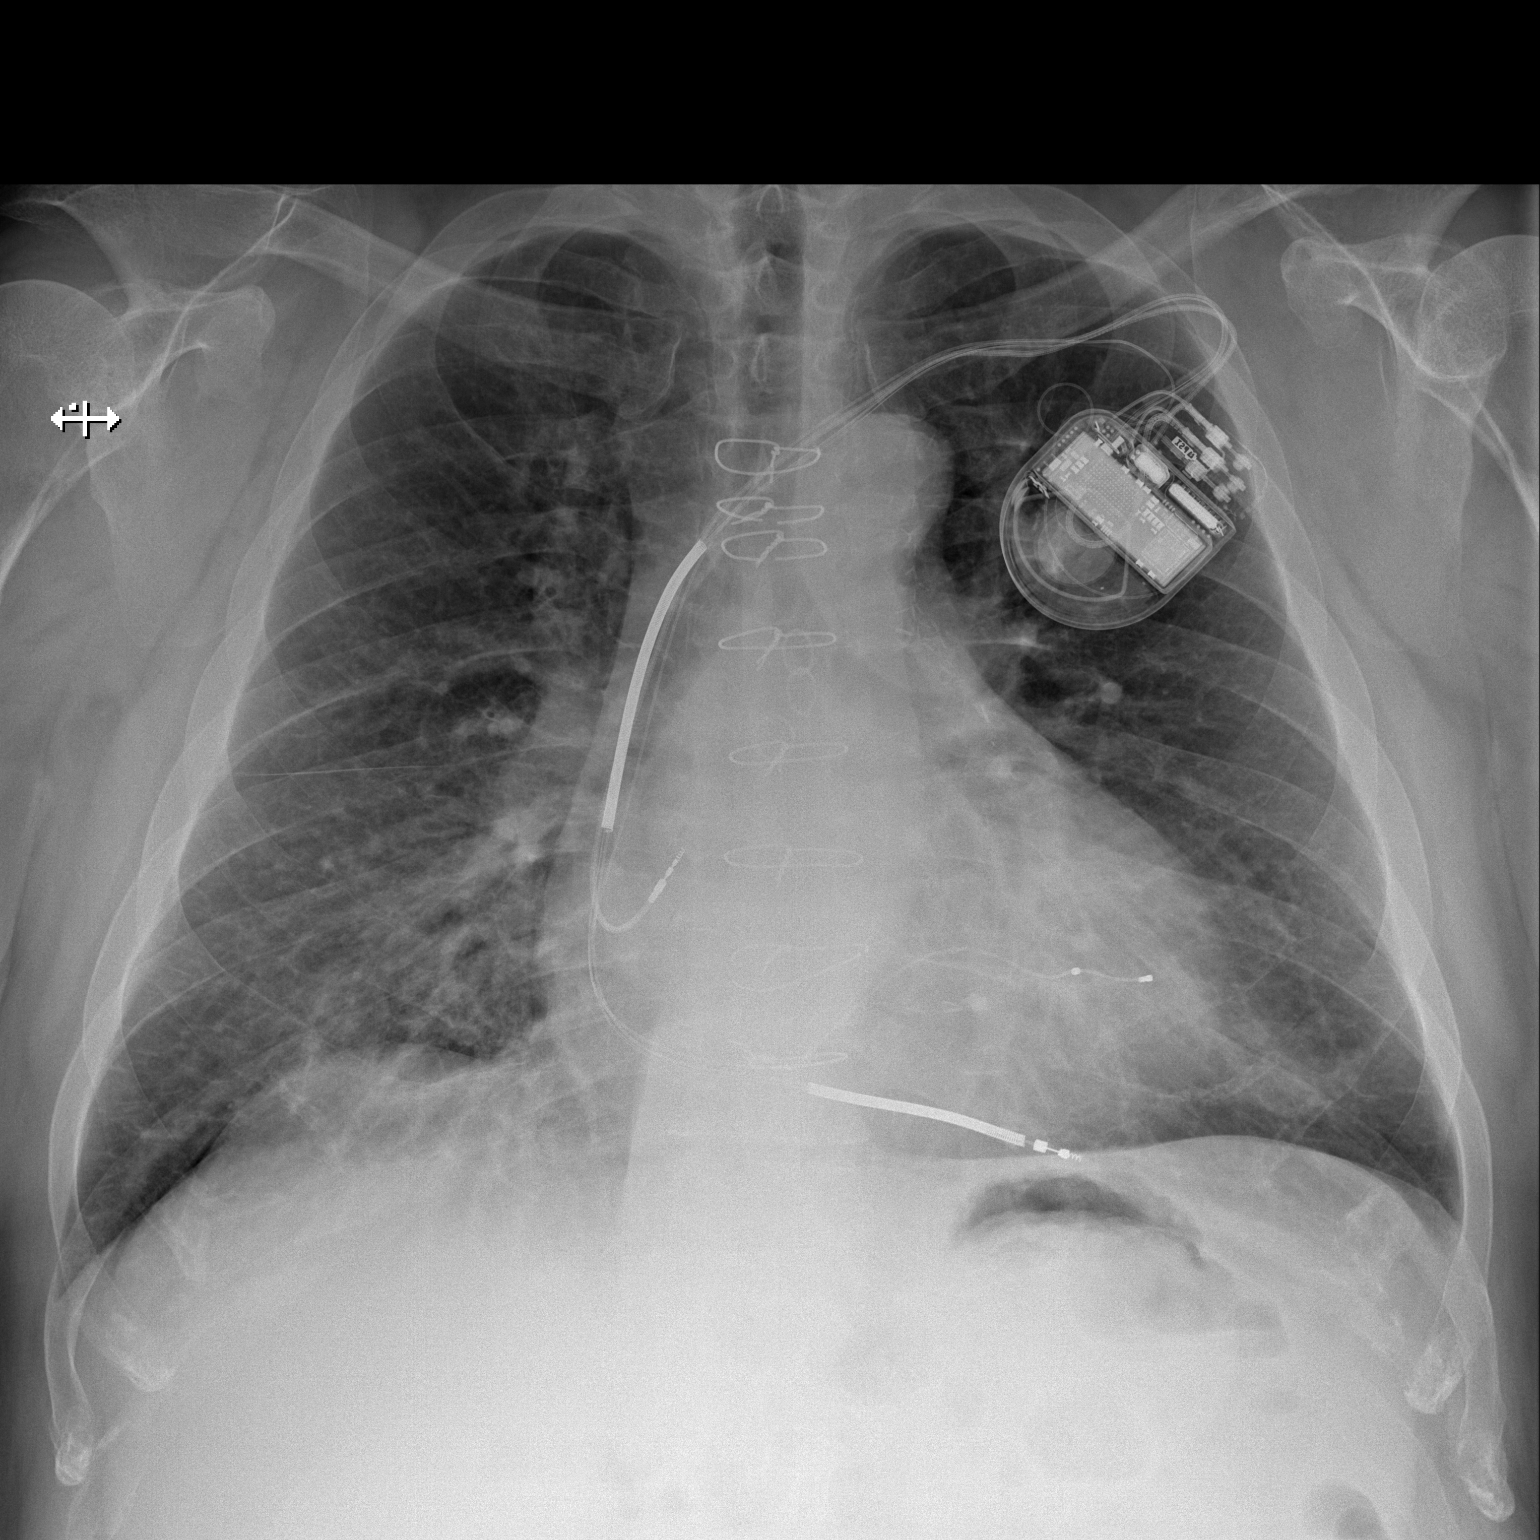

[1 of 1 positions shown; findings below may reference images not displayed]

FINDINGS: Cardiomegaly again noted. Status post CABG. Stable 3 leads cardiac
pacemaker position. Mild degenerative changes thoracic spine.
Central mild bronchitic changes. No acute infiltrate or pulmonary
edema.
IMPRESSION: Cardiomegaly. No acute infiltrate or pulmonary edema. Central mild
bronchitic changes.

## 2015-07-01 ENCOUNTER — Encounter (INDEPENDENT_AMBULATORY_CARE_PROVIDER_SITE_OTHER): Payer: 59 | Admitting: Physician Assistant

## 2015-08-14 IMAGING — CR DG CHEST 2V
1 series · 3 of 3 positions shown · non-contrast
Comparison: 01/25/2014

CLINICAL DATA: Chest pain, shortness of breath

EXAM:
CHEST  2 VIEW

[Series 1: pa · 0.17mm/px · 3 of 3 slices shown]
[im 1/3]
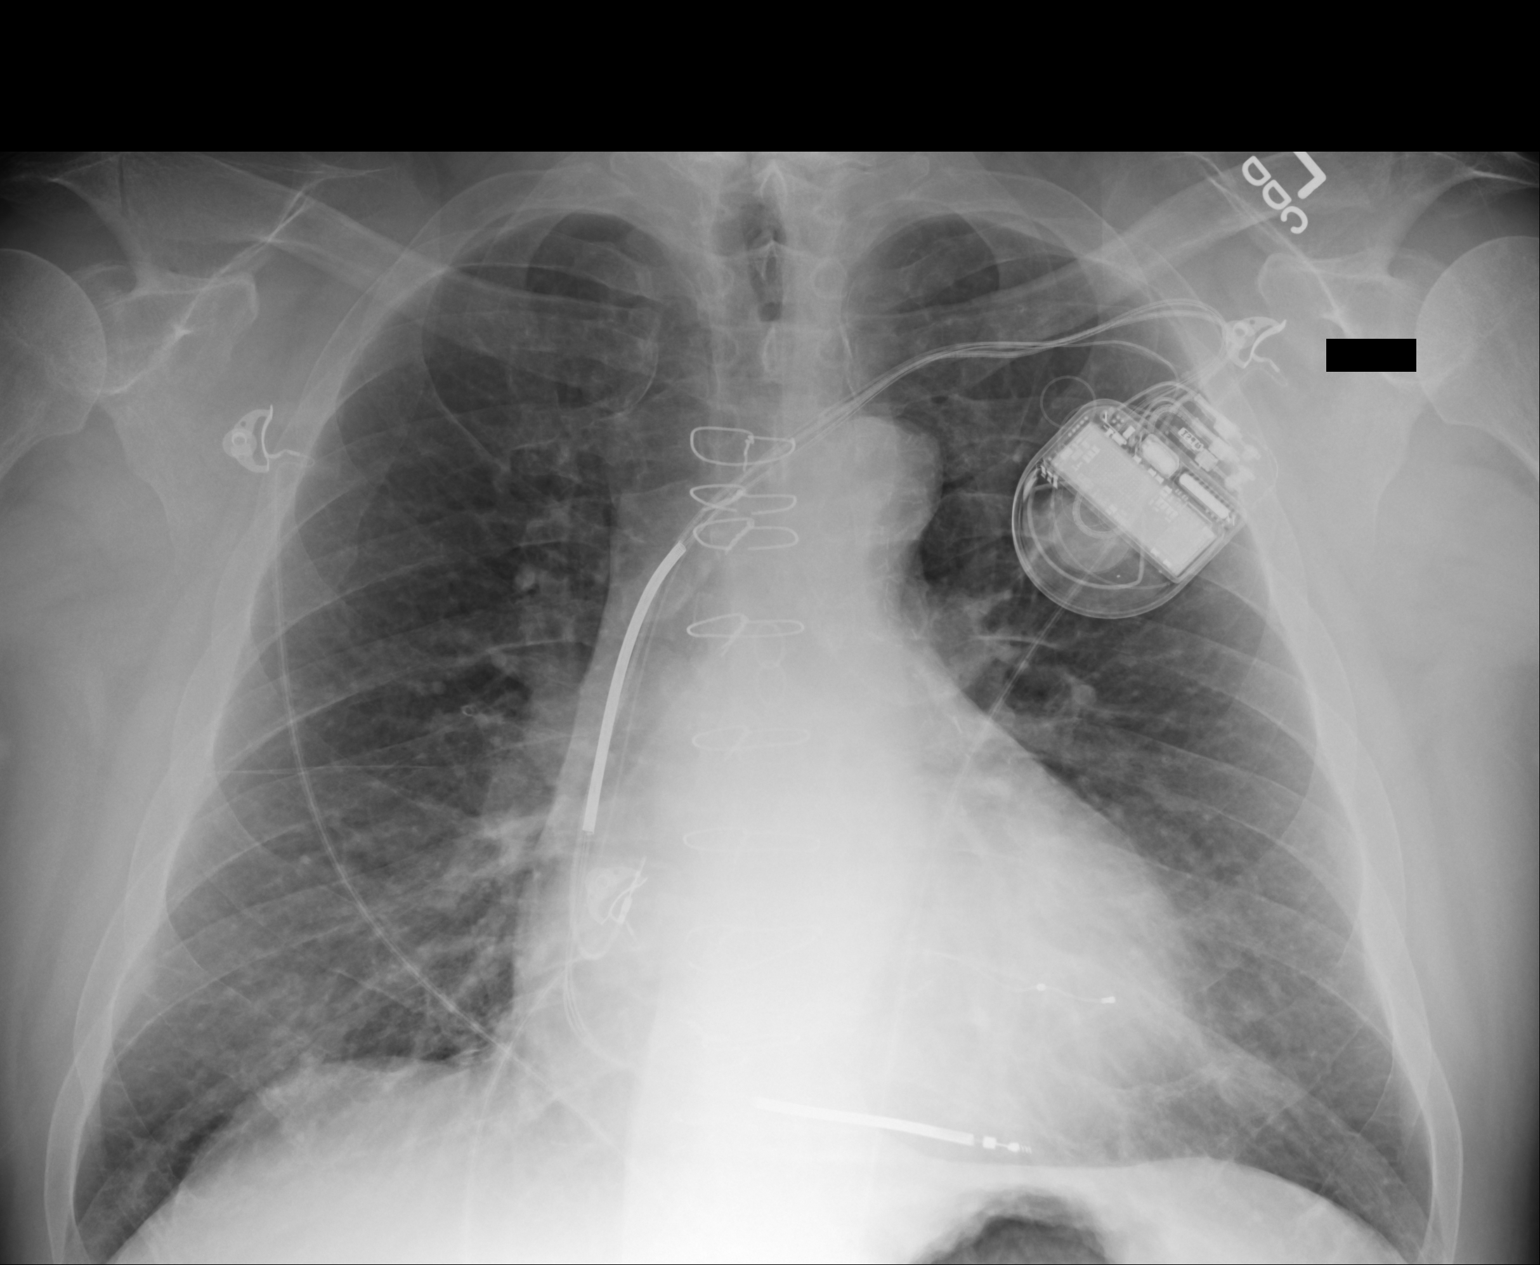
[im 2/3]
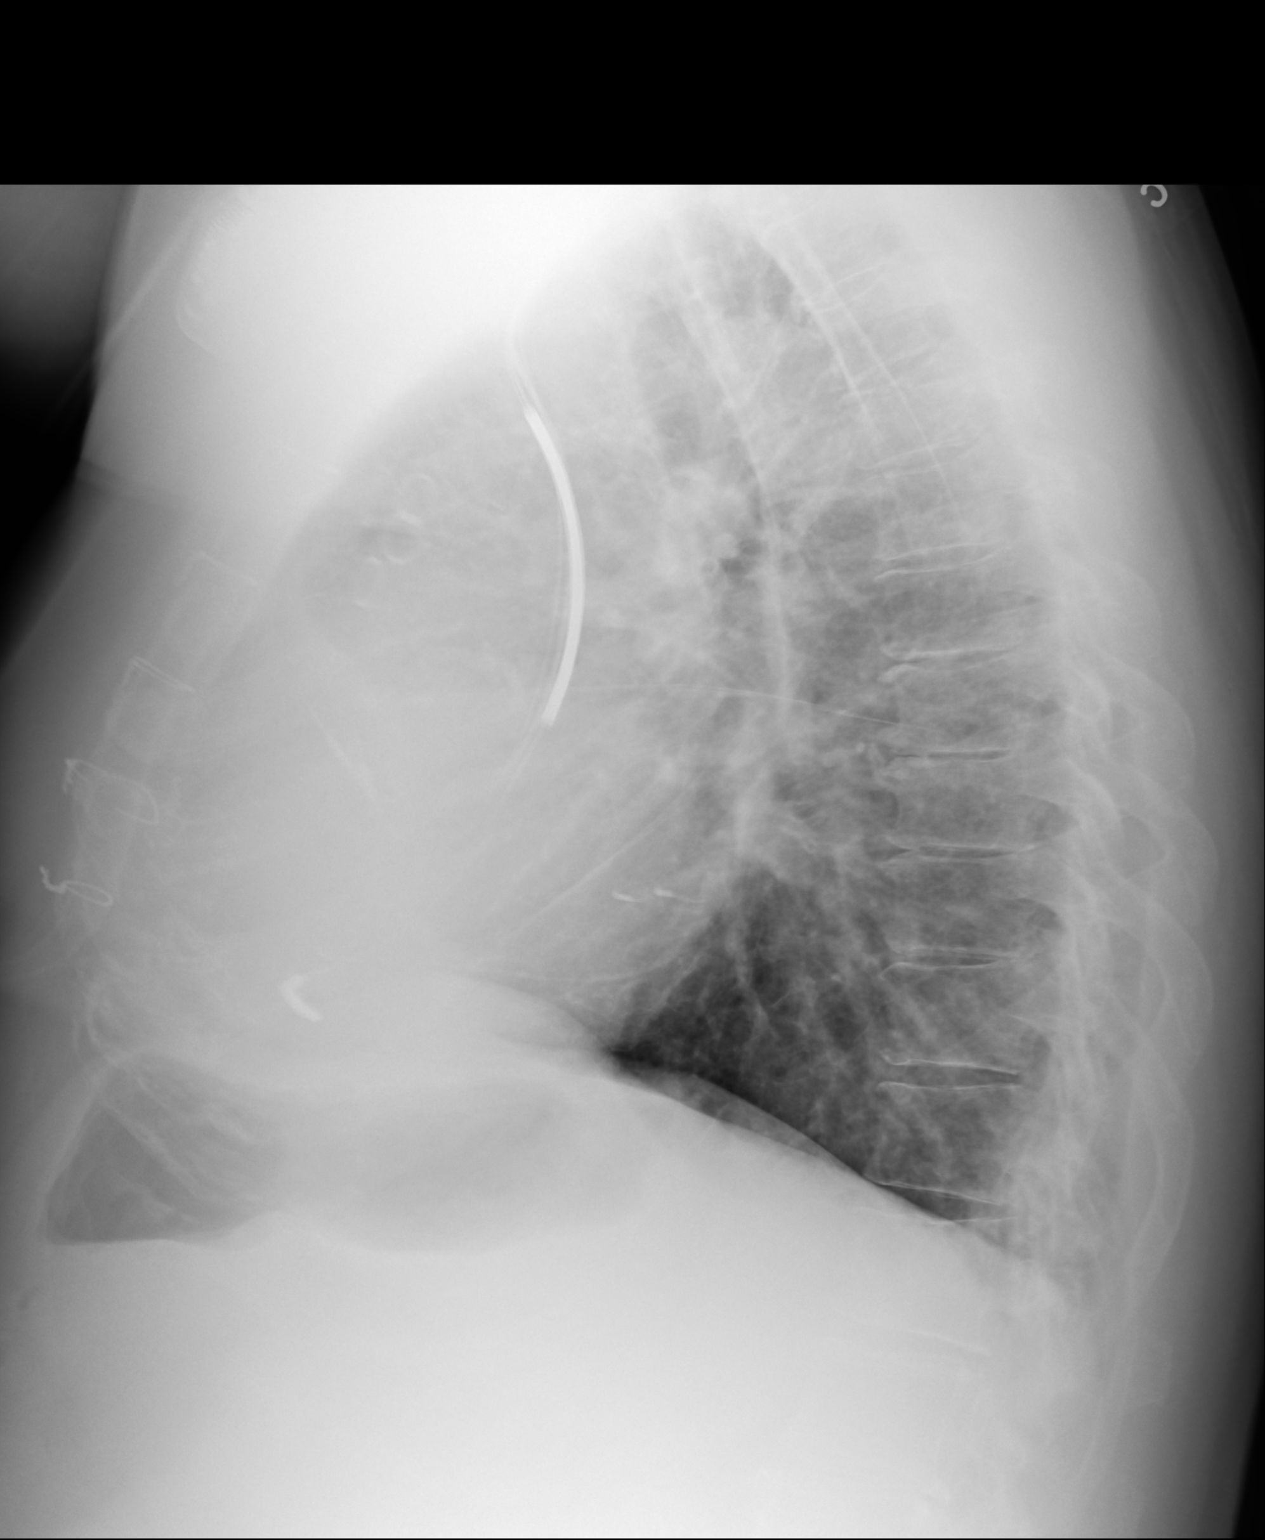
[im 3/3]
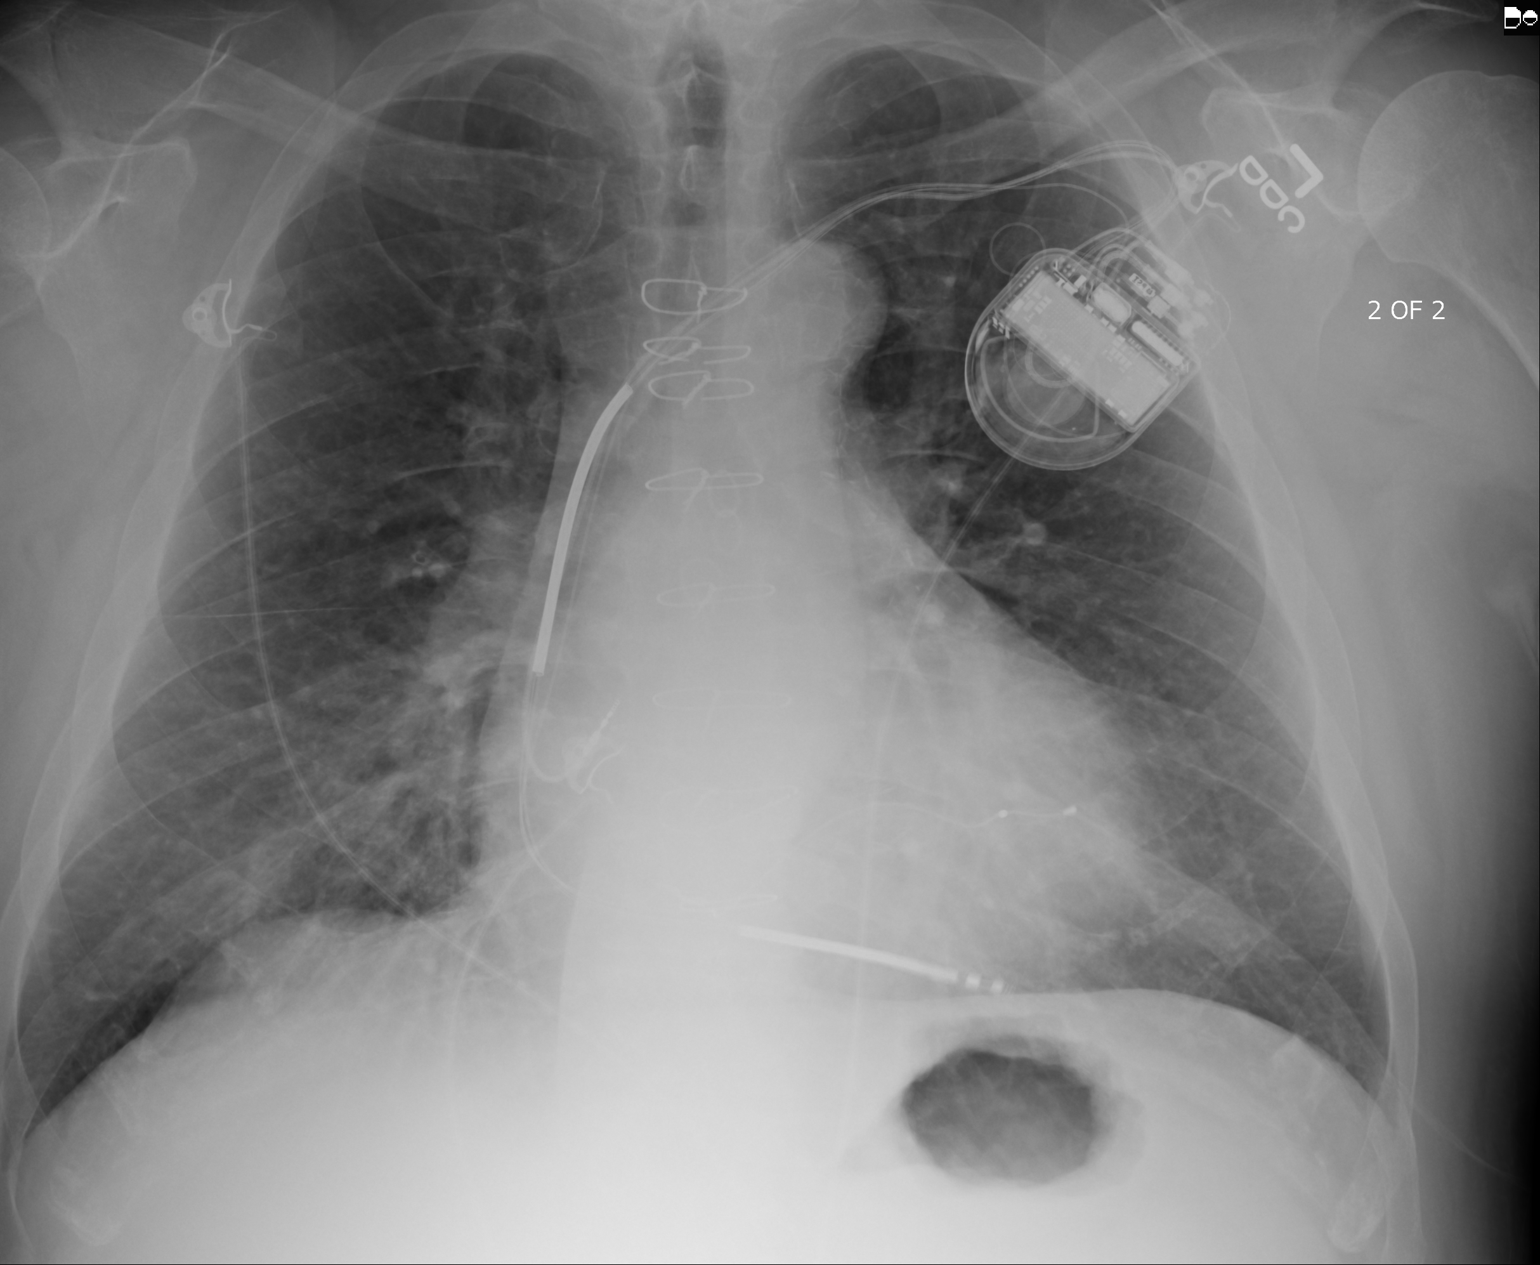

[3 of 3 positions shown; findings below may reference images not displayed]

FINDINGS: Cardiomegaly again noted. Status post median sternotomy. Three leads
cardiac pacemaker is unchanged in position. No acute infiltrate or
pulmonary edema. Stable chronic central mild bronchitic changes.
IMPRESSION: No active disease. Status post CABG. Stable chronic central mild
bronchitic changes.

## 2015-08-17 IMAGING — CR DG CHEST 2V
1 series · 2 of 2 positions shown · non-contrast
Comparison: 03/26/2014

CLINICAL DATA: Chest tightness and abdominal distension

EXAM:
CHEST  2 VIEW

[Series 1: w chest pa · 0.14mm/px · 2 of 2 slices shown]
[im 1/2]
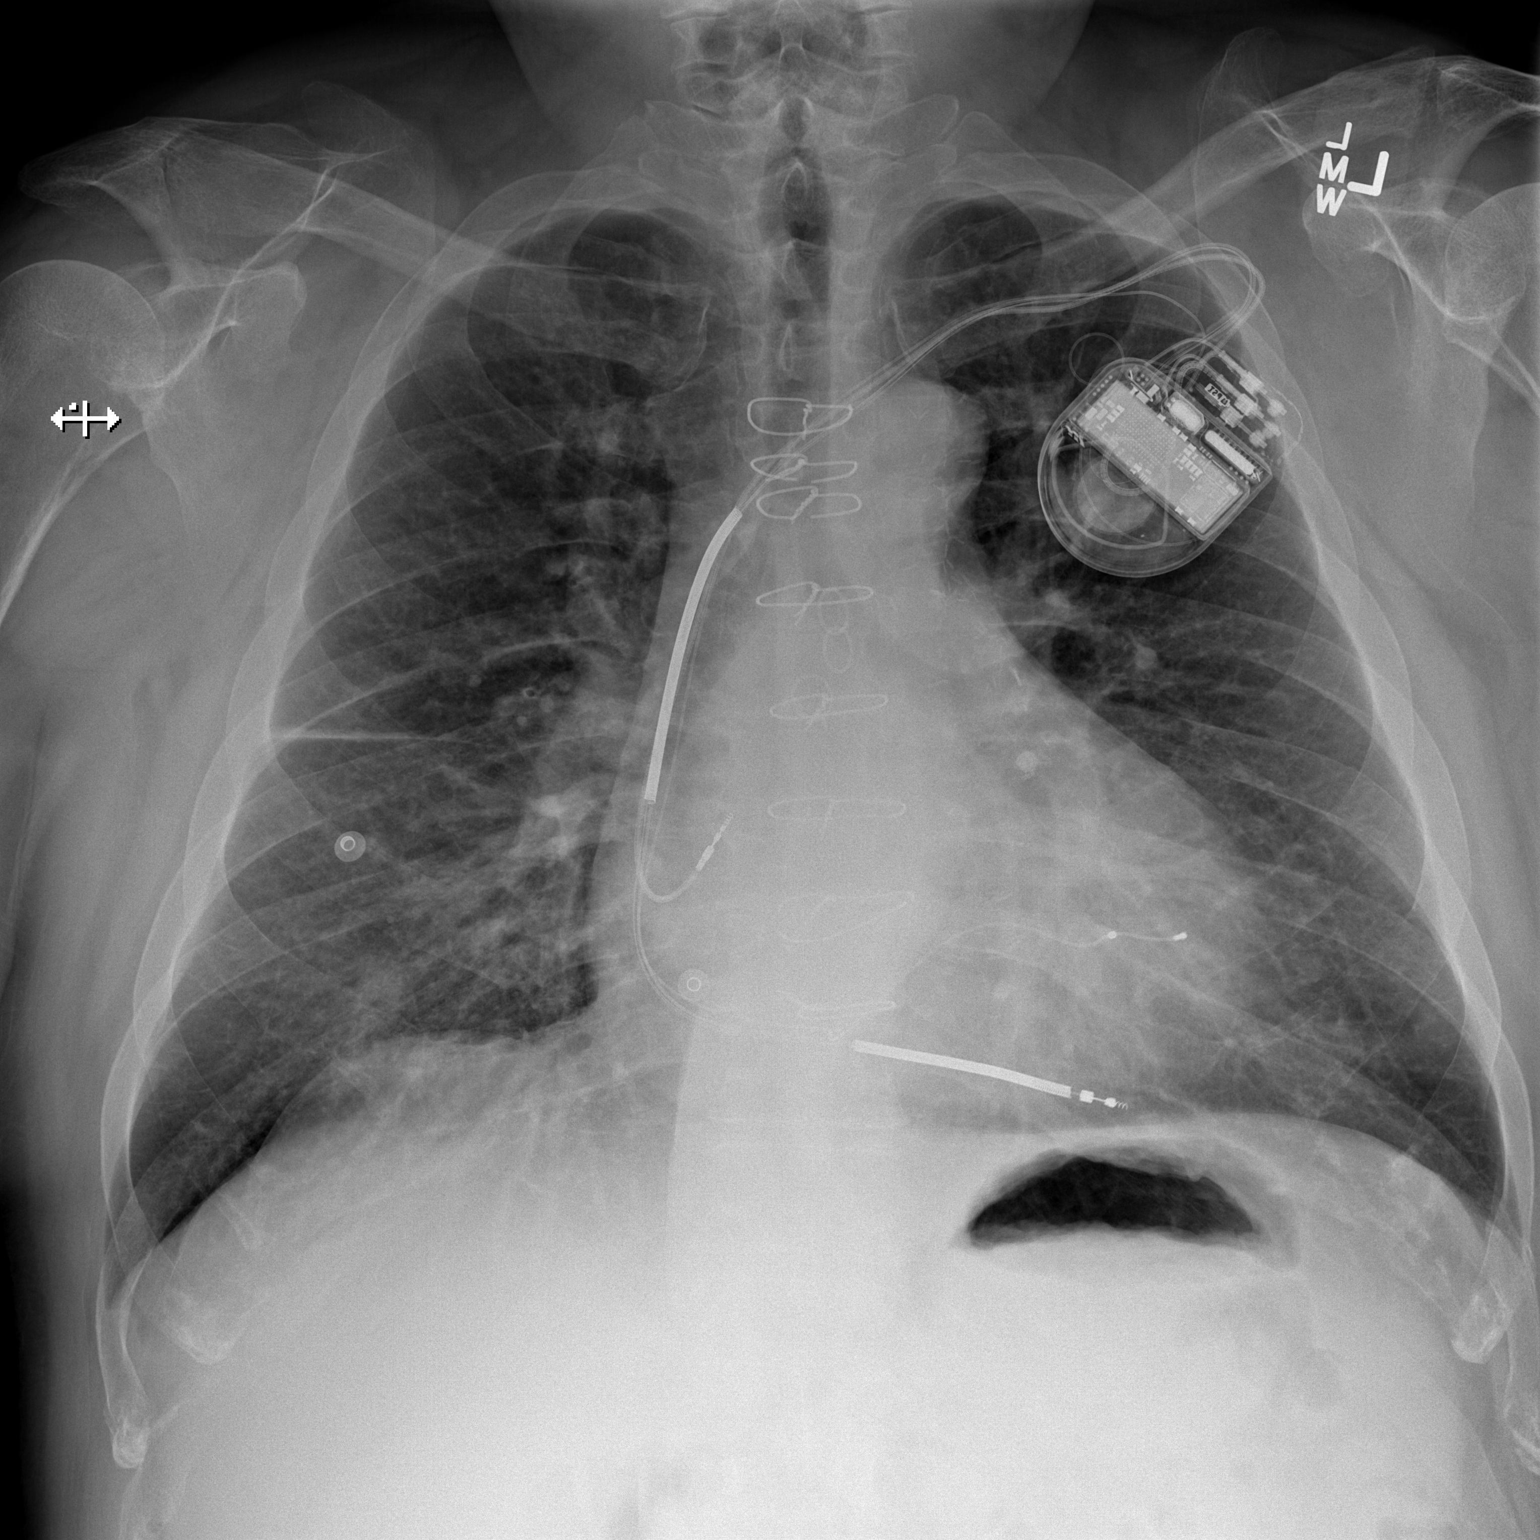
[im 2/2]
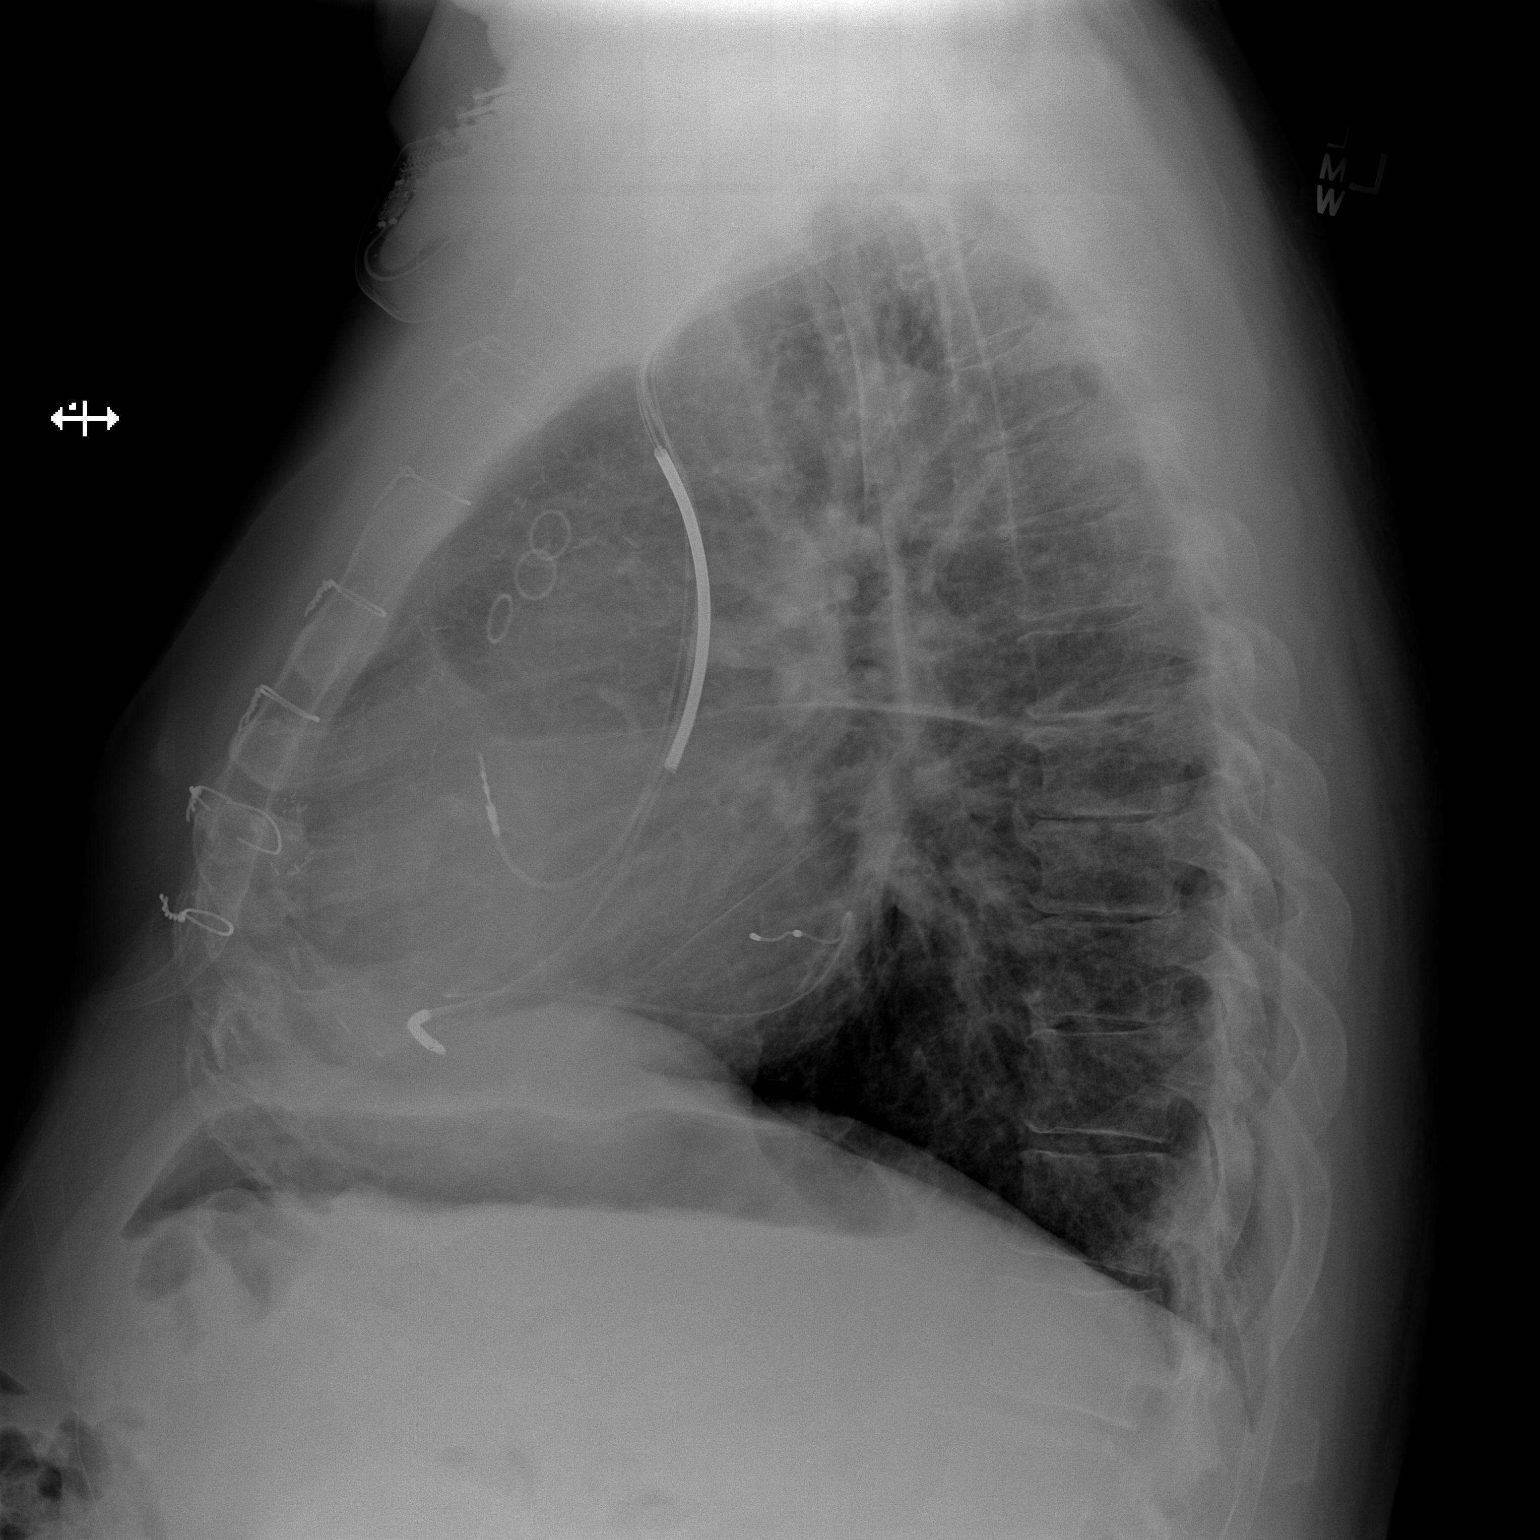

[2 of 2 positions shown; findings below may reference images not displayed]

FINDINGS: Stable orientation of biventricular ICD/pacer leads from the left.
There is chronic, stable mild cardiomegaly. Status post CABG.
Chronic fracture in the upper sternal wires without interval
displacement. There is fissural thickening, interstitial
coarsening,/bronchial cuffing, and probable few Kerley B-lines. No
pleural effusion or asymmetric opacity.
IMPRESSION: Pulmonary venous hypertension.

## 2015-08-27 ENCOUNTER — Inpatient Hospital Stay
Admission: EM | Admit: 2015-08-27 | Discharge: 2015-09-01 | DRG: 418 | Disposition: A | Discharge status: Home / Self Care | Payer: Medicare HMO | Attending: Internal Medicine | Admitting: Internal Medicine

## 2015-08-27 ENCOUNTER — Emergency Department: Payer: Medicare HMO

## 2015-08-27 ENCOUNTER — Encounter: Payer: Self-pay | Admitting: Emergency Medicine

## 2015-08-27 DIAGNOSIS — K8000 Calculus of gallbladder with acute cholecystitis without obstruction: Principal | ICD-10-CM | POA: Diagnosis present

## 2015-08-27 DIAGNOSIS — Z7982 Long term (current) use of aspirin: Secondary | ICD-10-CM

## 2015-08-27 DIAGNOSIS — E871 Hypo-osmolality and hyponatremia: Secondary | ICD-10-CM | POA: Diagnosis present

## 2015-08-27 DIAGNOSIS — Z79899 Other long term (current) drug therapy: Secondary | ICD-10-CM

## 2015-08-27 DIAGNOSIS — Z981 Arthrodesis status: Secondary | ICD-10-CM

## 2015-08-27 DIAGNOSIS — R1013 Epigastric pain: Secondary | ICD-10-CM

## 2015-08-27 DIAGNOSIS — I5023 Acute on chronic systolic (congestive) heart failure: Secondary | ICD-10-CM | POA: Diagnosis present

## 2015-08-27 DIAGNOSIS — E1165 Type 2 diabetes mellitus with hyperglycemia: Secondary | ICD-10-CM | POA: Diagnosis present

## 2015-08-27 DIAGNOSIS — I959 Hypotension, unspecified: Secondary | ICD-10-CM | POA: Diagnosis present

## 2015-08-27 DIAGNOSIS — J449 Chronic obstructive pulmonary disease, unspecified: Secondary | ICD-10-CM | POA: Diagnosis present

## 2015-08-27 DIAGNOSIS — I420 Dilated cardiomyopathy: Secondary | ICD-10-CM | POA: Diagnosis present

## 2015-08-27 DIAGNOSIS — I2510 Atherosclerotic heart disease of native coronary artery without angina pectoris: Secondary | ICD-10-CM | POA: Diagnosis present

## 2015-08-27 DIAGNOSIS — N179 Acute kidney failure, unspecified: Secondary | ICD-10-CM | POA: Diagnosis present

## 2015-08-27 DIAGNOSIS — R079 Chest pain, unspecified: Secondary | ICD-10-CM | POA: Diagnosis present

## 2015-08-27 DIAGNOSIS — F17210 Nicotine dependence, cigarettes, uncomplicated: Secondary | ICD-10-CM | POA: Diagnosis present

## 2015-08-27 DIAGNOSIS — I10 Essential (primary) hypertension: Secondary | ICD-10-CM | POA: Diagnosis present

## 2015-08-27 DIAGNOSIS — R109 Unspecified abdominal pain: Secondary | ICD-10-CM

## 2015-08-27 DIAGNOSIS — F329 Major depressive disorder, single episode, unspecified: Secondary | ICD-10-CM | POA: Diagnosis present

## 2015-08-27 DIAGNOSIS — Z4502 Encounter for adjustment and management of automatic implantable cardiac defibrillator: Secondary | ICD-10-CM

## 2015-08-27 DIAGNOSIS — F4321 Adjustment disorder with depressed mood: Secondary | ICD-10-CM | POA: Diagnosis present

## 2015-08-27 DIAGNOSIS — R0602 Shortness of breath: Secondary | ICD-10-CM

## 2015-08-27 DIAGNOSIS — R0789 Other chest pain: Secondary | ICD-10-CM

## 2015-08-27 DIAGNOSIS — Z8249 Family history of ischemic heart disease and other diseases of the circulatory system: Secondary | ICD-10-CM

## 2015-08-27 DIAGNOSIS — I5022 Chronic systolic (congestive) heart failure: Secondary | ICD-10-CM | POA: Diagnosis not present

## 2015-08-27 DIAGNOSIS — R778 Other specified abnormalities of plasma proteins: Secondary | ICD-10-CM | POA: Diagnosis present

## 2015-08-27 DIAGNOSIS — Z833 Family history of diabetes mellitus: Secondary | ICD-10-CM

## 2015-08-27 DIAGNOSIS — E876 Hypokalemia: Secondary | ICD-10-CM | POA: Diagnosis present

## 2015-08-27 DIAGNOSIS — R7989 Other specified abnormal findings of blood chemistry: Secondary | ICD-10-CM | POA: Diagnosis present

## 2015-08-27 DIAGNOSIS — Z794 Long term (current) use of insulin: Secondary | ICD-10-CM

## 2015-08-27 DIAGNOSIS — Z886 Allergy status to analgesic agent status: Secondary | ICD-10-CM

## 2015-08-27 DIAGNOSIS — I248 Other forms of acute ischemic heart disease: Secondary | ICD-10-CM | POA: Diagnosis present

## 2015-08-27 DIAGNOSIS — Z9049 Acquired absence of other specified parts of digestive tract: Secondary | ICD-10-CM | POA: Insufficient documentation

## 2015-08-27 DIAGNOSIS — Z95810 Presence of automatic (implantable) cardiac defibrillator: Secondary | ICD-10-CM

## 2015-08-27 DIAGNOSIS — Z951 Presence of aortocoronary bypass graft: Secondary | ICD-10-CM

## 2015-08-27 DIAGNOSIS — E119 Type 2 diabetes mellitus without complications: Secondary | ICD-10-CM

## 2015-08-27 DIAGNOSIS — K668 Other specified disorders of peritoneum: Secondary | ICD-10-CM | POA: Diagnosis present

## 2015-08-27 HISTORY — DX: Chronic systolic (congestive) heart failure: I50.22

## 2015-08-27 IMAGING — CR DG CHEST 2V
1 series · 1 of 1 positions shown · non-contrast
Comparison: 03/29/2014.

CLINICAL DATA: Shortness of breath and chest tightness.

EXAM:
CHEST  2 VIEW

[w chest pa]
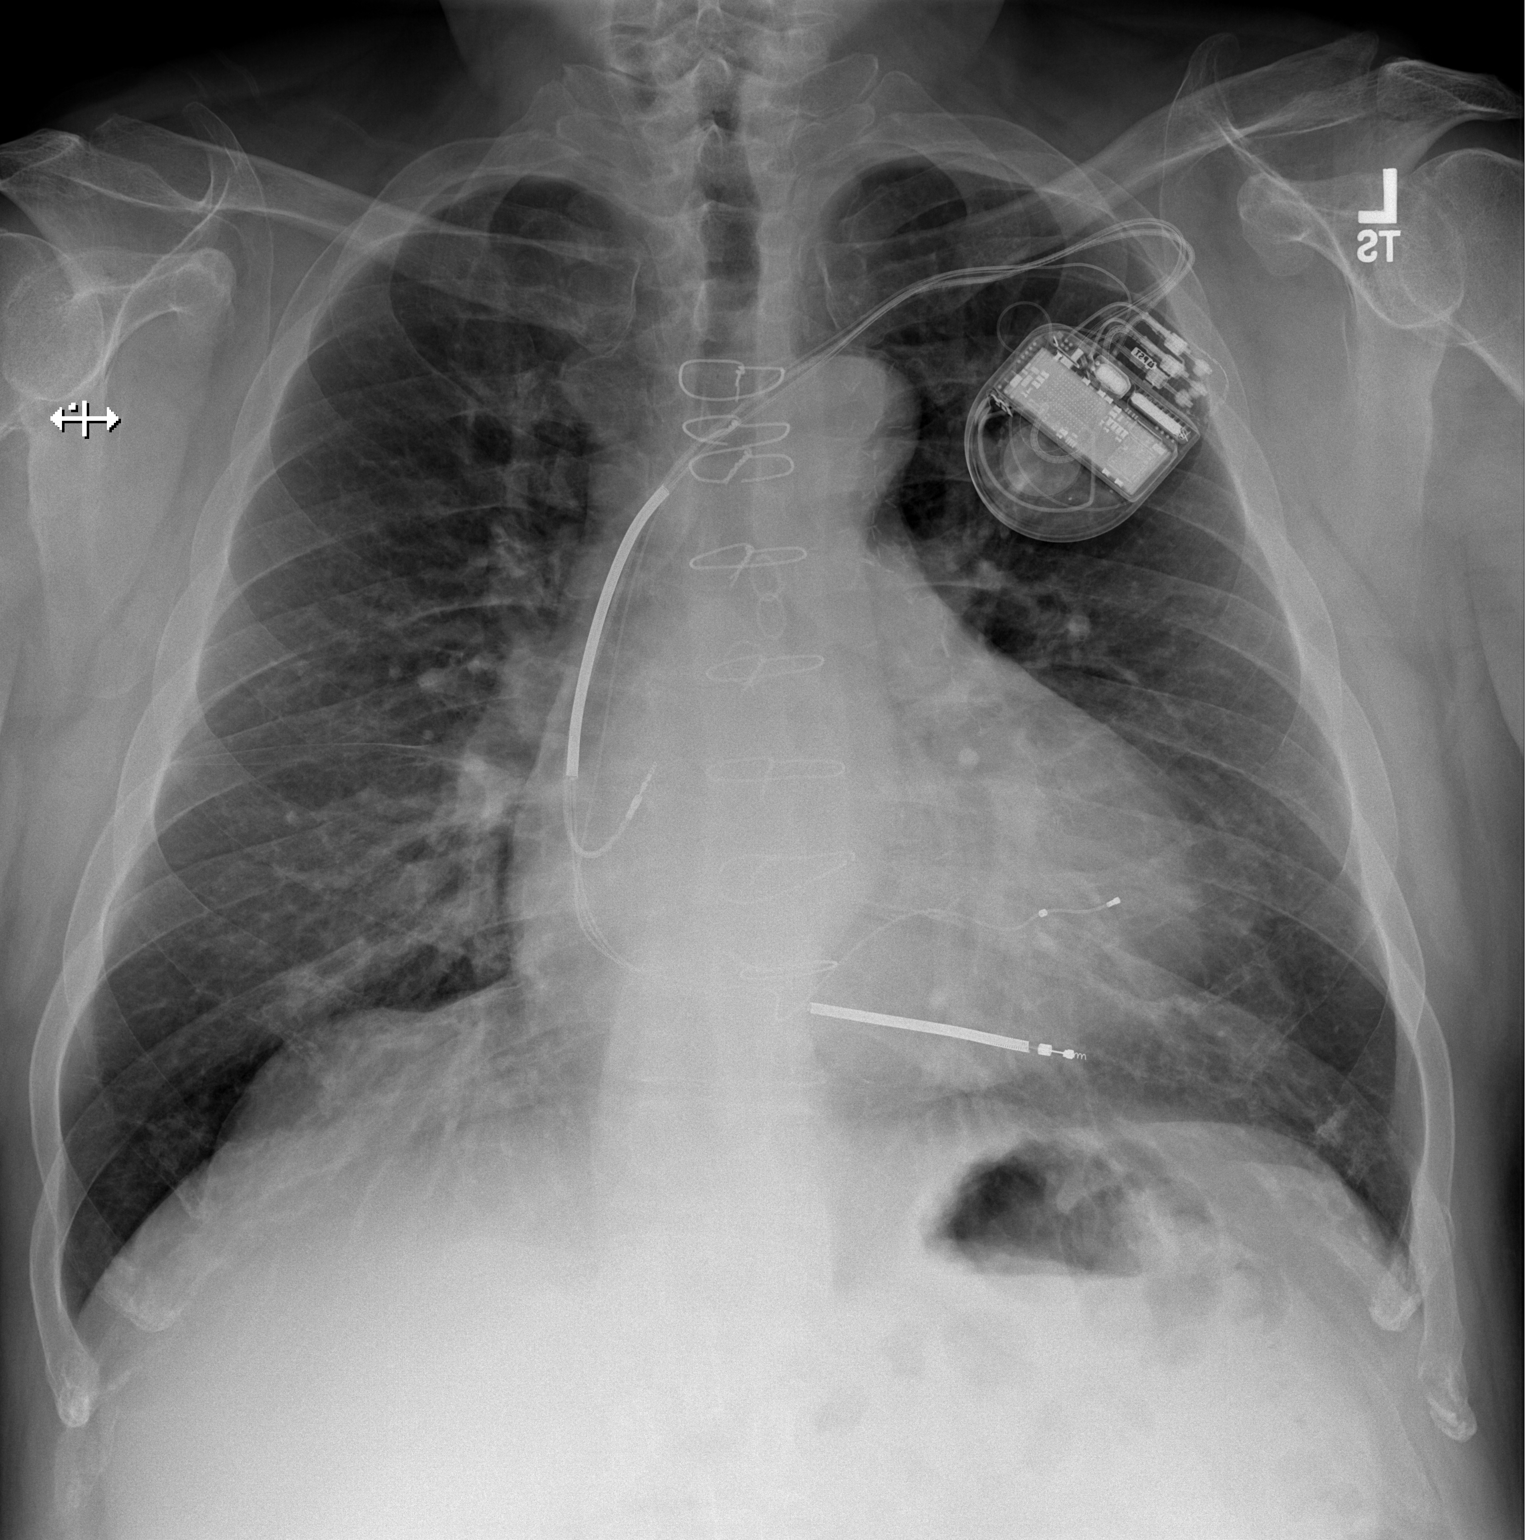

[1 of 1 positions shown; findings below may reference images not displayed]

FINDINGS: The cardio pericardial silhouette is enlarged. Interstitial markings
are diffusely coarsened with chronic features. The lungs are clear
without focal infiltrate, edema, pneumothorax or pleural effusion.
Left-sided pacer/ AICD remains in place. Median sternotomy wires are
again noted with fracture of some of the upper wires. Bones are
diffusely demineralized.
IMPRESSION: Hyperexpansion with cardiomegaly. No acute cardiopulmonary findings.

## 2015-08-27 MED ORDER — ASPIRIN 81 MG PO CHEW
253.0000 mg | CHEWABLE_TABLET | Freq: Once | ORAL | Status: AC
Start: 2015-08-27 — End: 2015-08-27
  Administered 2015-08-27: 243 mg via ORAL
  Filled 2015-08-27: qty 3

## 2015-08-27 NOTE — ED Notes (Signed)
Pt states has had a cough with brown sputum production. Pt states awoke from sleep, coughed and his defibrillator/pacemaker fired. Pt with pwd skin, appears in no acute distress. Pt states had chest pressure that continues. Pt states took 81mg  of asa.

## 2015-08-27 NOTE — ED Provider Notes (Signed)
[facility] Emergency Department Provider Note  ____________________________________________  Time seen: 11:13 PM  I have reviewed the triage vital signs and the nursing notes.   HISTORY  Chief Complaint Pacemaker Problem and Cough      HPI Ronald Miller. is a 59 y.o. male with history of coronary artery disease status post 6 vessel CABG in 2000. Patient presents with history of coughing tonight followed by being shocked by his defibrillator. Patient states she's had a nonproductive cough for approximately 2 weeks no fever. Patient currently complains of 7 out of 10 chest tightness that is diffuse in nature. Patient denies any dyspnea at this time. Patient denies any palpitations. Of note patient states that he used cocaine approximately 2 weeks ago.     Past Medical History  Diagnosis Date  . ASCVD (arteriosclerotic cardiovascular disease)   . Diabetes mellitus   . Hypertension   . History of cocaine abuse   . Tobacco abuse   . Depression   . Suicidal ideation   . CHF (congestive heart failure) ([place])   . COPD (chronic obstructive pulmonary disease) ([place])   . Coronary artery disease     Patient Active Problem List   Diagnosis Date Noted  . Chronic systolic heart failure ([place]) 02/04/2015  . DIABETES MELLITUS, TYPE II 08/06/2009  . TOBACCO ABUSE 08/06/2009  . ATHEROSCLEROTIC CARDIOVASCULAR DISEASE 08/06/2009  . LOW BACK PAIN, CHRONIC 08/06/2009    Past Surgical History  Procedure Laterality Date  . Coronary artery bypass graft  2001  . Lumbar disc surgery  02/1999    Discectomy and fusion  . Vasectomy      Subsequent reversal  . Insert / replace / remove pacemaker    . Cardiac defibrillator placement      Current Outpatient Rx  Name  Route  Sig  Dispense  Refill  . albuterol (PROVENTIL HFA;VENTOLIN HFA) 108 (90 BASE) MCG/ACT inhaler   Inhalation   Inhale 2 puffs into the lungs every 6 (six) hours as needed for wheezing or shortness  of breath.         Marland Kitchen aspirin 325 MG tablet   Oral   Take 325 mg by mouth daily.           . carvedilol (COREG) 3.125 MG tablet   Oral   Take 3.125 mg by mouth 2 (two) times daily with a meal.         . furosemide (LASIX) 40 MG tablet   Oral   Take 40 mg by mouth 2 (two) times daily.         Marland Kitchen glipiZIDE (GLUCOTROL XL) 5 MG 24 hr tablet   Oral   Take 2.5 mg by mouth 2 (two) times daily.           Marland Kitchen lisinopril (PRINIVIL,ZESTRIL) 10 MG tablet   Oral   Take 5 mg by mouth daily.          . metFORMIN (GLUCOPHAGE) 500 MG tablet   Oral   Take 1,000 mg by mouth 2 (two) times daily with a meal.          . simvastatin (ZOCOR) 40 MG tablet   Oral   Take 40 mg by mouth at bedtime.             Allergies Codeine  History reviewed. No pertinent family history.  Social History Social History  Substance Use Topics  . Smoking status: Smoker, Current Status Unknown -- 1.50 packs/day for 23 years  Types: Cigarettes  . Smokeless tobacco: Never Used  . Alcohol Use: No    Review of Systems  Constitutional: Negative for fever. Eyes: Negative for visual changes. ENT: Negative for sore throat. Cardiovascular: Positive for chest pain. Respiratory: Negative for shortness of breath. Positive for cough Gastrointestinal: Negative for abdominal pain, vomiting and diarrhea. Genitourinary: Negative for dysuria. Musculoskeletal: Negative for back pain. Skin: Negative for rash. Neurological: Negative for headaches, focal weakness or numbness.   10-point ROS otherwise negative.  ____________________________________________   PHYSICAL EXAM:  VITAL SIGNS: ED Triage Vitals  Enc Vitals Group     BP 08/27/15 2313 102/73 mmHg     Pulse Rate 08/27/15 2313 83     Resp 08/27/15 2313 20     Temp 08/27/15 2313 98.3 F (36.8 C)     Temp Source 08/27/15 2313 Oral     SpO2 08/27/15 2313 96 %     Weight 08/27/15 2313 242 lb 1 oz (109.799 kg)     Height 08/27/15 2313 5\' 9"   (1.753 m)     Head Cir --      Peak Flow --      Pain Score 08/27/15 2315 7     Pain Loc --      Pain Edu? --      Excl. in [place]? --      Constitutional: Alert and oriented. Well appearing and in no distress. Eyes: Conjunctivae are normal. PERRL. Normal extraocular movements. ENT   Head: Normocephalic and atraumatic.   Nose: No congestion/rhinnorhea.   Mouth/Throat: Mucous membranes are moist.   Neck: No stridor. Hematological/Lymphatic/Immunilogical: No cervical lymphadenopathy. Cardiovascular: Normal rate, regular rhythm. Normal and symmetric distal pulses are present in all extremities. No murmurs, rubs, or gallops. Respiratory: Normal respiratory effort without tachypnea nor retractions. Breath sounds are clear and equal bilaterally. No wheezes/rales/rhonchi. Gastrointestinal: Soft and nontender. No distention. There is no CVA tenderness. Genitourinary: deferred Musculoskeletal: Nontender with normal range of motion in all extremities. No joint effusions.  No lower extremity tenderness nor edema. Neurologic:  Normal speech and language. No gross focal neurologic deficits are appreciated. Speech is normal.  Skin:  Skin is warm, dry and intact. No rash noted. Psychiatric: Mood and affect are normal. Speech and behavior are normal. Patient exhibits appropriate insight and judgment.  ____________________________________________    LABS (pertinent positives/negatives)  Labs Reviewed  MRSA PCR SCREENING - Abnormal; Notable for the following:    MRSA by PCR POSITIVE (*)    All other components within normal limits  TROPONIN I - Abnormal; Notable for the following:    Troponin I 0.06 (*)    All other components within normal limits  CBC WITH DIFFERENTIAL/PLATELET - Abnormal; Notable for the following:    RBC 4.24 (*)    Hemoglobin 12.9 (*)    HCT 38.8 (*)    Platelets 135 (*)    Neutro Abs 7.0 (*)    All other components within normal limits  COMPREHENSIVE  METABOLIC PANEL - Abnormal; Notable for the following:    Potassium 2.6 (*)    Chloride 99 (*)    Glucose, Bld 122 (*)    BUN 23 (*)    Calcium 8.4 (*)    All other components within normal limits  CBC - Abnormal; Notable for the following:    RBC 3.94 (*)    Hemoglobin 12.1 (*)    HCT 36.2 (*)    Platelets 118 (*)    All other components within normal limits  CREATININE, SERUM - Abnormal; Notable for the following:    Creatinine, Ser 1.28 (*)    All other components within normal limits  BASIC METABOLIC PANEL - Abnormal; Notable for the following:    Potassium 2.9 (*)    Chloride 95 (*)    Glucose, Bld 244 (*)    BUN 30 (*)    Calcium 8.6 (*)    All other components within normal limits  CBC - Abnormal; Notable for the following:    RBC 3.99 (*)    Hemoglobin 12.2 (*)    HCT 36.7 (*)    Platelets 119 (*)    All other components within normal limits  TROPONIN I - Abnormal; Notable for the following:    Troponin I 0.07 (*)    All other components within normal limits  TROPONIN I - Abnormal; Notable for the following:    Troponin I 0.06 (*)    All other components within normal limits  TROPONIN I - Abnormal; Notable for the following:    Troponin I 0.05 (*)    All other components within normal limits  HEMOGLOBIN A1C - Abnormal; Notable for the following:    Hgb A1c MFr Bld 7.1 (*)    All other components within normal limits  GLUCOSE, CAPILLARY - Abnormal; Notable for the following:    Glucose-Capillary 160 (*)    All other components within normal limits  GLUCOSE, CAPILLARY - Abnormal; Notable for the following:    Glucose-Capillary 120 (*)    All other components within normal limits  GLUCOSE, CAPILLARY - Abnormal; Notable for the following:    Glucose-Capillary 115 (*)    All other components within normal limits  GLUCOSE, CAPILLARY - Abnormal; Notable for the following:    Glucose-Capillary 227 (*)    All other components within normal limits  GLUCOSE,  CAPILLARY - Abnormal; Notable for the following:    Glucose-Capillary 163 (*)    All other components within normal limits  CBC - Abnormal; Notable for the following:    RBC 3.90 (*)    Hemoglobin 12.0 (*)    HCT 35.9 (*)    Platelets 113 (*)    All other components within normal limits  BASIC METABOLIC PANEL - Abnormal; Notable for the following:    Sodium 133 (*)    Chloride 97 (*)    Glucose, Bld 194 (*)    BUN 39 (*)    Creatinine, Ser 1.34 (*)    Calcium 8.3 (*)    GFR calc non Af Amer 57 (*)    All other components within normal limits  GLUCOSE, CAPILLARY - Abnormal; Notable for the following:    Glucose-Capillary 234 (*)    All other components within normal limits  CULTURE, BLOOD (ROUTINE X 2)  CULTURE, BLOOD (ROUTINE X 2)  MAGNESIUM  MAGNESIUM  POTASSIUM  [facility] ONLY)  [facility] ONLY)  [facility] ONLY)     ____________________________________________   EKG  ED ECG REPORT I, Samyrah Bruster, [place], the attending physician, personally viewed and interpreted this ECG.   Date: 08/27/2015  EKG Time: 11:14 PM7  Rate: 75  Rhythm: Normal sinus rhythm  Axis: None  Intervals: Normal  ST&T Change: None   ____________________________________________    RADIOLOGY    DG Chest Port 1 View (Final result) Result time: 08/28/15 11:27:07   Final result by Rad Results In Interface (08/28/15 11:27:07)   Narrative:   CLINICAL DATA: Shortness of breath and chest pain, defibrillator discharged last night.  EXAM:  PORTABLE CHEST 1 VIEW  COMPARISON: Chest x-rays dated 08/27/2015 and 04/09/2014.  FINDINGS: Cardiomegaly is unchanged. Left chest wall pacemaker/AICD appear stable in position. No evidence of wire discontinuity  There is no evidence of acute infiltrate on today's exam. Subtle opacity at the left lung base appears similar to previous chest x-rays from 2015 indicating chronic scarring or atelectasis. Lungs are otherwise clear. No evidence of  active/acute congestive heart failure. No confluent opacity to suggest a developing pneumonia. No pleural effusion seen.  IMPRESSION: No evidence of acute cardiopulmonary abnormality. No evidence of acute/active congestive heart failure or pneumonia on today's exam. Cardiomegaly is stable.   Electronically Signed By: Franki Cabot M.D. On: 08/28/2015 11:27          DG Chest Port 1 View (Final result) Result time: 08/27/15 23:38:19   Final result by Rad Results In Interface (08/27/15 23:38:19)   Narrative:   CLINICAL DATA: Initial evaluation for acute cough, chest pain.  EXAM: PORTABLE CHEST 1 VIEW  COMPARISON: Prior study from 04/09/2014.  FINDINGS: Median sternotomy wires with underlying CABG markers and surgical clips noted. Dehiscence of a few of the sternotomy wires noted, stable. Left-sided transvenous pacemaker/ AICD is item changed. Cardiomegaly is unchanged. Mediastinal silhouette within normal limits.  Lungs are normally inflated. Mild vascular congestion without overt pulmonary edema. No pleural effusion. Mild scattered left basilar opacity, favored to reflect atelectasis, possible developing infiltrate could be considered in the correct clinical setting. No pneumothorax.  No acute osseus abnormality.  IMPRESSION: 1. Stable cardiomegaly with mild pulmonary vascular congestion without overt pulmonary edema. 2. Mild left basilar opacity, favored to reflect atelectasis, although possible early/developing infiltrate could be considered in the correct clinical setting.   Electronically Signed By: Jeannine Boga M.D. On: 08/27/2015 23:38          INITIAL IMPRESSION / ASSESSMENT AND PLAN / ED COURSE  Pertinent labs & imaging results that were available during my care of the patient were reviewed by me and considered in my medical decision making (see chart for  details).    ____________________________________________   FINAL CLINICAL IMPRESSION(S) / ED DIAGNOSES  Final diagnoses:  Shortness of breath  Chest Pain    Gregor Hams, MD 08/29/15 [phone]

## 2015-08-28 ENCOUNTER — Inpatient Hospital Stay: Payer: Medicare HMO

## 2015-08-28 ENCOUNTER — Encounter: Payer: Self-pay | Admitting: Internal Medicine

## 2015-08-28 DIAGNOSIS — I2510 Atherosclerotic heart disease of native coronary artery without angina pectoris: Secondary | ICD-10-CM | POA: Diagnosis present

## 2015-08-28 DIAGNOSIS — I420 Dilated cardiomyopathy: Secondary | ICD-10-CM | POA: Diagnosis present

## 2015-08-28 DIAGNOSIS — Z79899 Other long term (current) drug therapy: Secondary | ICD-10-CM | POA: Diagnosis not present

## 2015-08-28 DIAGNOSIS — I5022 Chronic systolic (congestive) heart failure: Secondary | ICD-10-CM

## 2015-08-28 DIAGNOSIS — Z0389 Encounter for observation for other suspected diseases and conditions ruled out: Secondary | ICD-10-CM | POA: Diagnosis not present

## 2015-08-28 DIAGNOSIS — I10 Essential (primary) hypertension: Secondary | ICD-10-CM | POA: Diagnosis present

## 2015-08-28 DIAGNOSIS — I959 Hypotension, unspecified: Secondary | ICD-10-CM | POA: Diagnosis present

## 2015-08-28 DIAGNOSIS — Z4502 Encounter for adjustment and management of automatic implantable cardiac defibrillator: Secondary | ICD-10-CM

## 2015-08-28 DIAGNOSIS — Z833 Family history of diabetes mellitus: Secondary | ICD-10-CM | POA: Diagnosis not present

## 2015-08-28 DIAGNOSIS — Z886 Allergy status to analgesic agent status: Secondary | ICD-10-CM | POA: Diagnosis not present

## 2015-08-28 DIAGNOSIS — J449 Chronic obstructive pulmonary disease, unspecified: Secondary | ICD-10-CM | POA: Diagnosis present

## 2015-08-28 DIAGNOSIS — R778 Other specified abnormalities of plasma proteins: Secondary | ICD-10-CM | POA: Diagnosis present

## 2015-08-28 DIAGNOSIS — K668 Other specified disorders of peritoneum: Secondary | ICD-10-CM | POA: Diagnosis present

## 2015-08-28 DIAGNOSIS — F17210 Nicotine dependence, cigarettes, uncomplicated: Secondary | ICD-10-CM | POA: Diagnosis present

## 2015-08-28 DIAGNOSIS — E1165 Type 2 diabetes mellitus with hyperglycemia: Secondary | ICD-10-CM | POA: Diagnosis present

## 2015-08-28 DIAGNOSIS — K8000 Calculus of gallbladder with acute cholecystitis without obstruction: Secondary | ICD-10-CM | POA: Diagnosis present

## 2015-08-28 DIAGNOSIS — Z8249 Family history of ischemic heart disease and other diseases of the circulatory system: Secondary | ICD-10-CM | POA: Diagnosis not present

## 2015-08-28 DIAGNOSIS — Z794 Long term (current) use of insulin: Secondary | ICD-10-CM | POA: Diagnosis not present

## 2015-08-28 DIAGNOSIS — R079 Chest pain, unspecified: Secondary | ICD-10-CM | POA: Diagnosis not present

## 2015-08-28 DIAGNOSIS — E876 Hypokalemia: Secondary | ICD-10-CM | POA: Diagnosis present

## 2015-08-28 DIAGNOSIS — F329 Major depressive disorder, single episode, unspecified: Secondary | ICD-10-CM | POA: Diagnosis present

## 2015-08-28 DIAGNOSIS — F4321 Adjustment disorder with depressed mood: Secondary | ICD-10-CM | POA: Diagnosis present

## 2015-08-28 DIAGNOSIS — Z7982 Long term (current) use of aspirin: Secondary | ICD-10-CM | POA: Diagnosis not present

## 2015-08-28 DIAGNOSIS — I248 Other forms of acute ischemic heart disease: Secondary | ICD-10-CM | POA: Diagnosis present

## 2015-08-28 DIAGNOSIS — K8010 Calculus of gallbladder with chronic cholecystitis without obstruction: Secondary | ICD-10-CM | POA: Diagnosis not present

## 2015-08-28 DIAGNOSIS — Z95810 Presence of automatic (implantable) cardiac defibrillator: Secondary | ICD-10-CM | POA: Diagnosis not present

## 2015-08-28 DIAGNOSIS — E871 Hypo-osmolality and hyponatremia: Secondary | ICD-10-CM | POA: Diagnosis present

## 2015-08-28 DIAGNOSIS — Z981 Arthrodesis status: Secondary | ICD-10-CM | POA: Diagnosis not present

## 2015-08-28 DIAGNOSIS — R7989 Other specified abnormal findings of blood chemistry: Secondary | ICD-10-CM

## 2015-08-28 DIAGNOSIS — N179 Acute kidney failure, unspecified: Secondary | ICD-10-CM | POA: Diagnosis present

## 2015-08-28 DIAGNOSIS — Z951 Presence of aortocoronary bypass graft: Secondary | ICD-10-CM | POA: Diagnosis not present

## 2015-08-28 LAB — CBC WITH DIFFERENTIAL/PLATELET
BASOS ABS: 0 10*3/uL (ref 0–0.1)
BASOS PCT: 0 %
EOS ABS: 0.2 10*3/uL (ref 0–0.7)
EOS PCT: 2 %
HCT: 38.8 % — ABNORMAL LOW (ref 40.0–52.0)
HEMOGLOBIN: 12.9 g/dL — AB (ref 13.0–18.0)
Lymphocytes Relative: 22 %
Lymphs Abs: 2.3 10*3/uL (ref 1.0–3.6)
MCH: 30.4 pg (ref 26.0–34.0)
MCHC: 33.2 g/dL (ref 32.0–36.0)
MCV: 91.5 fL (ref 80.0–100.0)
Monocytes Absolute: 0.6 10*3/uL (ref 0.2–1.0)
Monocytes Relative: 6 %
NEUTROS PCT: 70 %
Neutro Abs: 7 10*3/uL — ABNORMAL HIGH (ref 1.4–6.5)
PLATELETS: 135 10*3/uL — AB (ref 150–440)
RBC: 4.24 MIL/uL — AB (ref 4.40–5.90)
RDW: 13.4 % (ref 11.5–14.5)
WBC: 10.1 10*3/uL (ref 3.8–10.6)

## 2015-08-28 LAB — CBC
HCT: 36.2 % — ABNORMAL LOW (ref 40.0–52.0)
HCT: 36.7 % — ABNORMAL LOW (ref 40.0–52.0)
HEMOGLOBIN: 12.1 g/dL — AB (ref 13.0–18.0)
Hemoglobin: 12.2 g/dL — ABNORMAL LOW (ref 13.0–18.0)
MCH: 30.8 pg (ref 26.0–34.0)
MCH: 30.7 pg (ref 26.0–34.0)
MCHC: 33.5 g/dL (ref 32.0–36.0)
MCHC: 33.4 g/dL (ref 32.0–36.0)
MCV: 91.8 fL (ref 80.0–100.0)
MCV: 91.9 fL (ref 80.0–100.0)
PLATELETS: 119 10*3/uL — AB (ref 150–440)
Platelets: 118 10*3/uL — ABNORMAL LOW (ref 150–440)
RBC: 3.94 MIL/uL — AB (ref 4.40–5.90)
RBC: 3.99 MIL/uL — ABNORMAL LOW (ref 4.40–5.90)
RDW: 13.5 % (ref 11.5–14.5)
RDW: 13.6 % (ref 11.5–14.5)
WBC: 8.1 10*3/uL (ref 3.8–10.6)
WBC: 6.5 10*3/uL (ref 3.8–10.6)

## 2015-08-28 LAB — MRSA PCR SCREENING: MRSA BY PCR: POSITIVE — AB

## 2015-08-28 LAB — POTASSIUM: Potassium: 3.6 mmol/L (ref 3.5–5.1)

## 2015-08-28 LAB — GLUCOSE, CAPILLARY
GLUCOSE-CAPILLARY: 115 mg/dL — AB (ref 65–99)
GLUCOSE-CAPILLARY: 163 mg/dL — AB (ref 65–99)
Glucose-Capillary: 160 mg/dL — ABNORMAL HIGH (ref 65–99)
Glucose-Capillary: 120 mg/dL — ABNORMAL HIGH (ref 65–99)
Glucose-Capillary: 227 mg/dL — ABNORMAL HIGH (ref 65–99)
Glucose-Capillary: 234 mg/dL — ABNORMAL HIGH (ref 65–99)

## 2015-08-28 LAB — COMPREHENSIVE METABOLIC PANEL
ALT: 18 U/L (ref 17–63)
AST: 24 U/L (ref 15–41)
Albumin: 3.8 g/dL (ref 3.5–5.0)
Alkaline Phosphatase: 48 U/L (ref 38–126)
Anion gap: 8 (ref 5–15)
BILIRUBIN TOTAL: 0.7 mg/dL (ref 0.3–1.2)
BUN: 23 mg/dL — AB (ref 6–20)
CO2: 30 mmol/L (ref 22–32)
CREATININE: 1.05 mg/dL (ref 0.61–1.24)
Calcium: 8.4 mg/dL — ABNORMAL LOW (ref 8.9–10.3)
Chloride: 99 mmol/L — ABNORMAL LOW (ref 101–111)
Glucose, Bld: 122 mg/dL — ABNORMAL HIGH (ref 65–99)
POTASSIUM: 2.6 mmol/L — AB (ref 3.5–5.1)
Sodium: 137 mmol/L (ref 135–145)
TOTAL PROTEIN: 6.5 g/dL (ref 6.5–8.1)

## 2015-08-28 LAB — BASIC METABOLIC PANEL
Anion gap: 10 (ref 5–15)
BUN: 30 mg/dL — AB (ref 6–20)
CALCIUM: 8.6 mg/dL — AB (ref 8.9–10.3)
CO2: 31 mmol/L (ref 22–32)
CREATININE: 1.14 mg/dL (ref 0.61–1.24)
Chloride: 95 mmol/L — ABNORMAL LOW (ref 101–111)
GFR calc Af Amer: >60 mL/min (ref >60)
GLUCOSE: 244 mg/dL — AB (ref 65–99)
POTASSIUM: 2.9 mmol/L — AB (ref 3.5–5.1)
SODIUM: 136 mmol/L (ref 135–145)

## 2015-08-28 LAB — CREATININE, SERUM: CREATININE: 1.28 mg/dL — AB (ref 0.61–1.24)

## 2015-08-28 LAB — TROPONIN I
TROPONIN I: 0.06 ng/mL — AB (ref <0.031)
TROPONIN I: 0.07 ng/mL — AB (ref <0.031)
TROPONIN I: 0.06 ng/mL — AB (ref <0.031)
TROPONIN I: 0.05 ng/mL — AB (ref <0.031)

## 2015-08-28 LAB — CKMB (ARMC ONLY)
CK, MB: 2.6 ng/mL (ref 0.5–5.0)
CK, MB: 2.7 ng/mL (ref 0.5–5.0)

## 2015-08-28 LAB — HEMOGLOBIN A1C: Hgb A1c MFr Bld: 7.1 % — ABNORMAL HIGH (ref 4.0–6.0)

## 2015-08-28 LAB — MAGNESIUM
MAGNESIUM: 1.7 mg/dL (ref 1.7–2.4)
MAGNESIUM: 1.7 mg/dL (ref 1.7–2.4)

## 2015-08-28 IMAGING — CR DG CHEST 1V PORT
1 series · 2 of 2 positions shown · non-contrast
Comparison: PA and lateral chest 04/08/2014 and 03/29/2014.

CLINICAL DATA: Chest pain and shortness of breath.

EXAM:
PORTABLE CHEST - 1 VIEW

[Series 1: ap · 0.17mm/px · 2 of 2 slices shown]
[im 1/2]
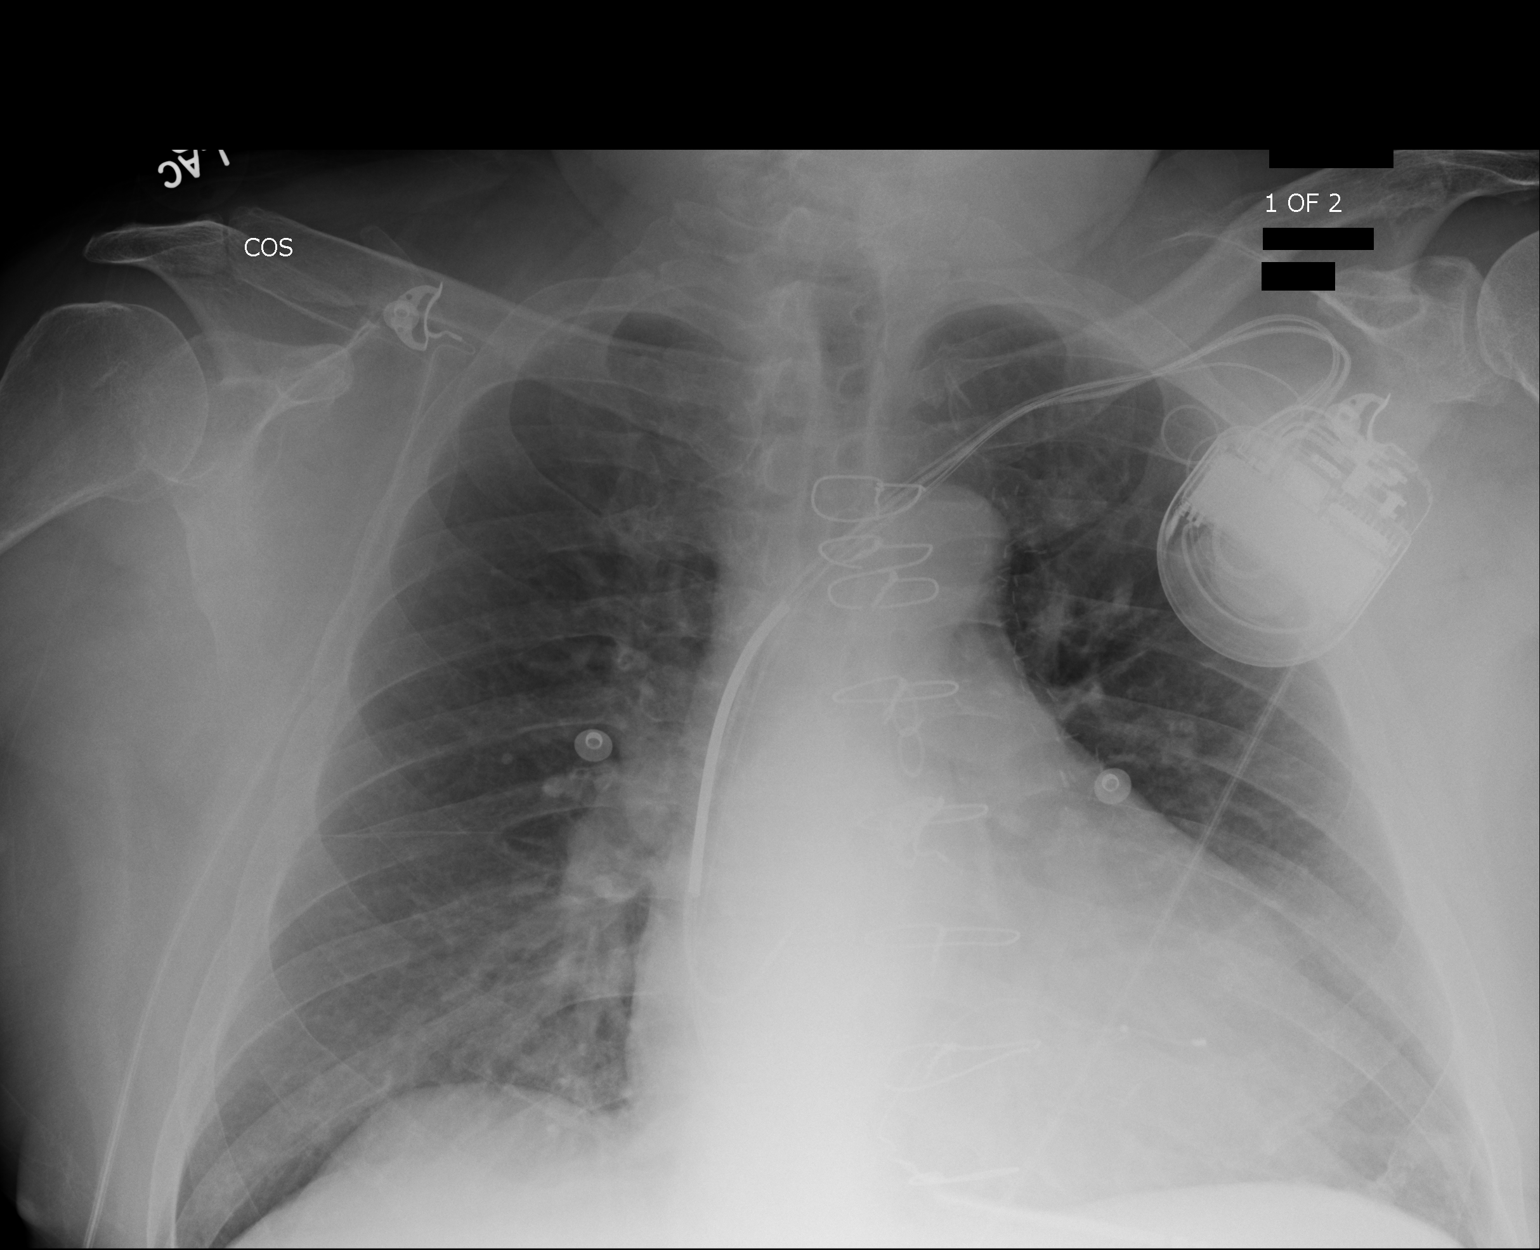
[im 2/2]
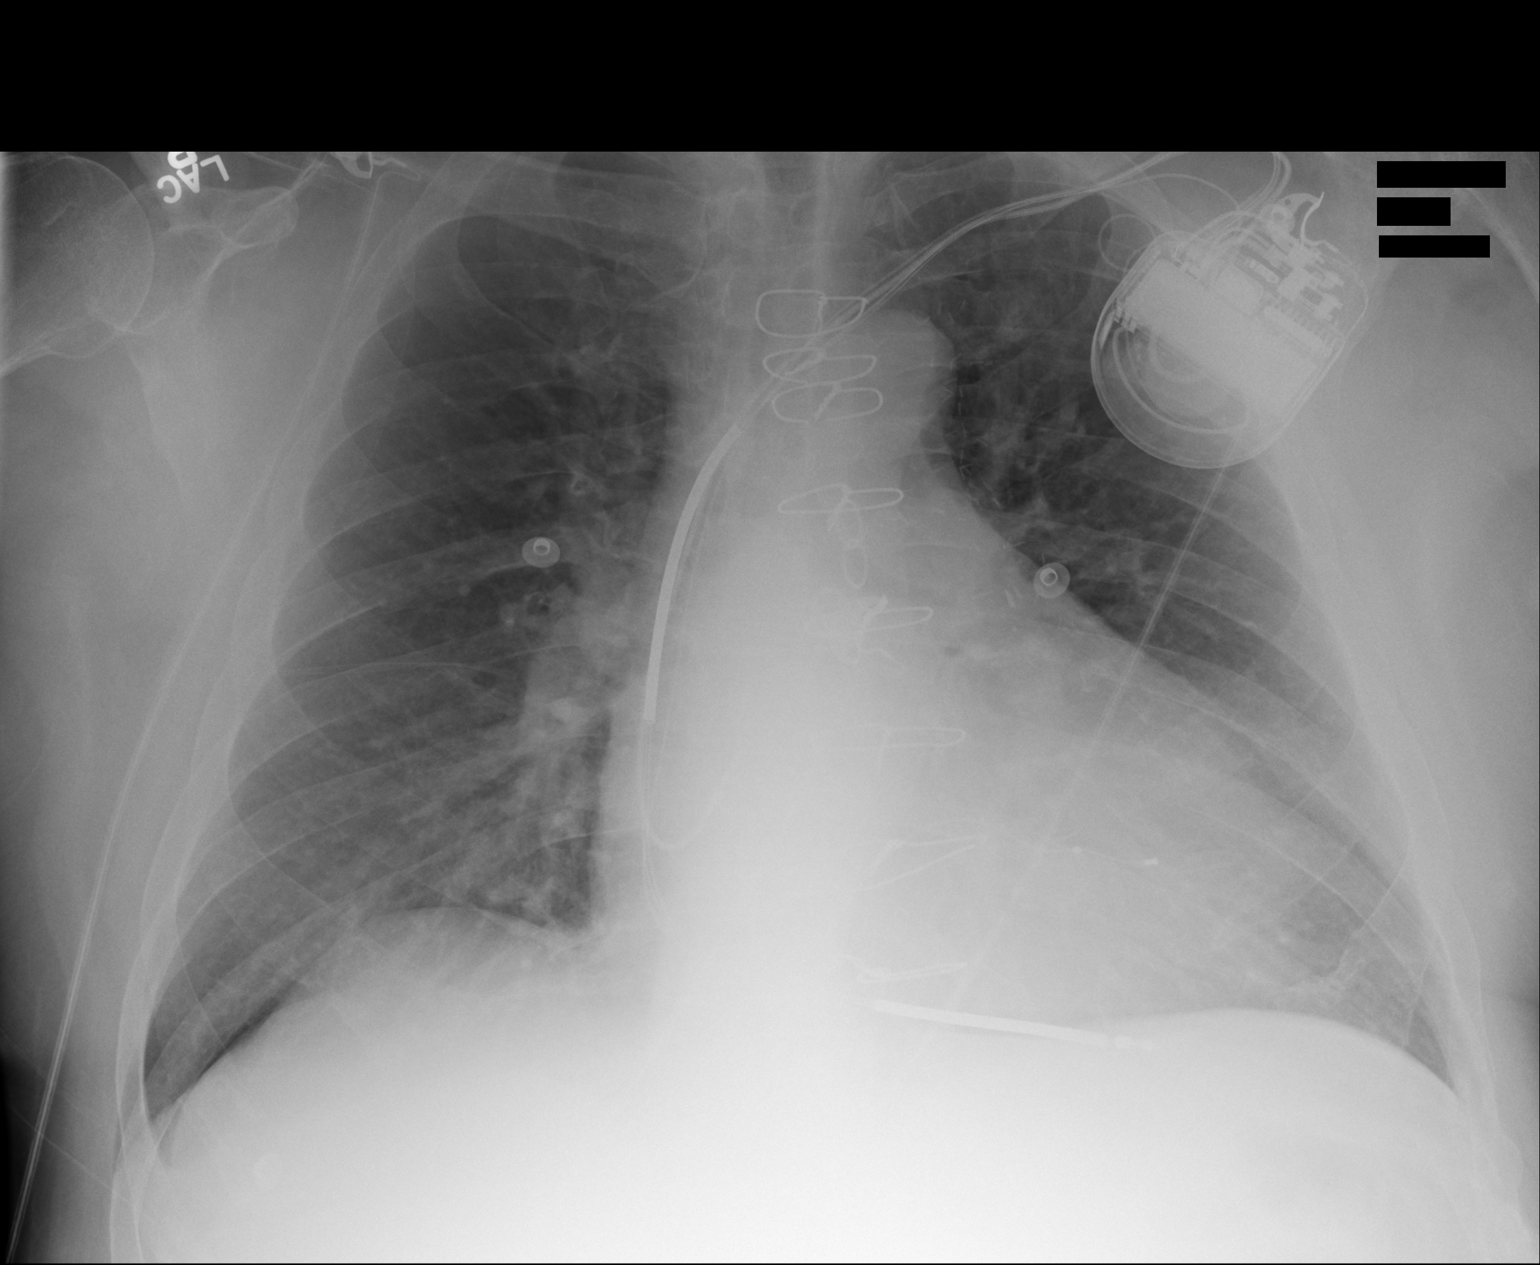

[2 of 2 positions shown; findings below may reference images not displayed]

FINDINGS: AICD/pacing device remains in place. The patient is status post
CABG. There is cardiomegaly without edema. Lungs are clear. No
pneumothorax or pleural effusion.
IMPRESSION: Cardiomegaly without acute disease.

## 2015-08-28 MED ORDER — METOPROLOL SUCCINATE ER 100 MG PO TB24: 100.0000 mg | ORAL_TABLET | Freq: Every day | ORAL | Status: DC

## 2015-08-28 MED ORDER — ACETAMINOPHEN 650 MG RE SUPP: 650.0000 mg | Freq: Four times a day (QID) | RECTAL | Status: DC | PRN

## 2015-08-28 MED ORDER — SPIRONOLACTONE 25 MG PO TABS: 50.0000 mg | ORAL_TABLET | Freq: Every day | ORAL | Status: DC

## 2015-08-28 MED ORDER — ACETAMINOPHEN 325 MG PO TABS: 650.0000 mg | ORAL_TABLET | Freq: Four times a day (QID) | ORAL | Status: DC | PRN

## 2015-08-28 MED ORDER — INSULIN ASPART 100 UNIT/ML ~~LOC~~ SOLN: 0.0000 [IU] | Freq: Four times a day (QID) | SUBCUTANEOUS | Status: DC

## 2015-08-28 MED ORDER — INSULIN ASPART 100 UNIT/ML ~~LOC~~ SOLN
0.0000 [IU] | Freq: Three times a day (TID) | SUBCUTANEOUS | Status: DC
  Administered 2015-08-28: 2 [IU] via SUBCUTANEOUS
  Administered 2015-08-28: 3 [IU] via SUBCUTANEOUS
  Administered 2015-08-29: 5 [IU] via SUBCUTANEOUS
  Administered 2015-08-29 (×2): 3 [IU] via SUBCUTANEOUS
  Administered 2015-08-30: 2 [IU] via SUBCUTANEOUS
  Administered 2015-08-30: 3 [IU] via SUBCUTANEOUS
  Filled 2015-08-28: qty 3
  Filled 2015-08-28: qty 2
  Filled 2015-08-28 (×2): qty 3
  Filled 2015-08-28: qty 5
  Filled 2015-08-28 (×2): qty 3

## 2015-08-28 MED ORDER — ONDANSETRON HCL 4 MG PO TABS: 4.0000 mg | ORAL_TABLET | Freq: Four times a day (QID) | ORAL | Status: DC | PRN

## 2015-08-28 MED ORDER — SODIUM CHLORIDE 0.9 % IJ SOLN
3.0000 mL | Freq: Two times a day (BID) | INTRAMUSCULAR | Status: DC
  Administered 2015-08-28 – 2015-09-01 (×8): 3 mL via INTRAVENOUS

## 2015-08-28 MED ORDER — MAGNESIUM SULFATE IN D5W 10-5 MG/ML-% IV SOLN
1.0000 g | Freq: Once | INTRAVENOUS | Status: AC
  Administered 2015-08-28: 1 g via INTRAVENOUS
  Filled 2015-08-28: qty 100

## 2015-08-28 MED ORDER — IPRATROPIUM-ALBUTEROL 0.5-2.5 (3) MG/3ML IN SOLN
3.0000 mL | RESPIRATORY_TRACT | Status: DC | PRN
  Administered 2015-08-28: 3 mL via RESPIRATORY_TRACT
  Filled 2015-08-28: qty 3

## 2015-08-28 MED ORDER — BENAZEPRIL HCL 5 MG PO TABS
5.0000 mg | ORAL_TABLET | Freq: Every day | ORAL | Status: DC
  Filled 2015-08-28: qty 1

## 2015-08-28 MED ORDER — MORPHINE SULFATE (PF) 2 MG/ML IV SOLN: 2.0000 mg | INTRAVENOUS | Status: DC | PRN

## 2015-08-28 MED ORDER — ATORVASTATIN CALCIUM 20 MG PO TABS
20.0000 mg | ORAL_TABLET | Freq: Every evening | ORAL | Status: DC
  Administered 2015-08-28 – 2015-08-31 (×4): 20 mg via ORAL
  Filled 2015-08-28 (×4): qty 1

## 2015-08-28 MED ORDER — CHLORHEXIDINE GLUCONATE CLOTH 2 % EX PADS
6.0000 | MEDICATED_PAD | Freq: Every day | CUTANEOUS | Status: DC
  Administered 2015-08-30: 6 via TOPICAL

## 2015-08-28 MED ORDER — MUPIROCIN 2 % EX OINT
1.0000 "application " | TOPICAL_OINTMENT | Freq: Two times a day (BID) | CUTANEOUS | Status: DC
  Administered 2015-08-28 – 2015-09-01 (×7): 1 via NASAL
  Filled 2015-08-28 (×2): qty 22

## 2015-08-28 MED ORDER — ONDANSETRON HCL 4 MG/2ML IJ SOLN: 4.0000 mg | Freq: Four times a day (QID) | INTRAMUSCULAR | Status: DC | PRN

## 2015-08-28 MED ORDER — POTASSIUM CHLORIDE CRYS ER 20 MEQ PO TBCR
40.0000 meq | EXTENDED_RELEASE_TABLET | ORAL | Status: DC | PRN
Start: 2015-08-28 — End: 2015-08-28

## 2015-08-28 MED ORDER — SODIUM CHLORIDE 0.9 % IV BOLUS (SEPSIS)
500.0000 mL | Freq: Once | INTRAVENOUS | Status: AC
  Administered 2015-08-28: 500 mL via INTRAVENOUS

## 2015-08-28 MED ORDER — SODIUM CHLORIDE 0.9 % IV BOLUS (SEPSIS)
250.0000 mL | Freq: Once | INTRAVENOUS | Status: AC
  Administered 2015-08-28: 250 mL via INTRAVENOUS

## 2015-08-28 MED ORDER — INSULIN ASPART 100 UNIT/ML ~~LOC~~ SOLN
0.0000 [IU] | Freq: Every day | SUBCUTANEOUS | Status: DC
  Administered 2015-08-28: 2 [IU] via SUBCUTANEOUS
  Filled 2015-08-28: qty 2

## 2015-08-28 MED ORDER — POTASSIUM CHLORIDE 20 MEQ PO PACK
40.0000 meq | PACK | Freq: Two times a day (BID) | ORAL | Status: DC
  Administered 2015-08-28: 40 meq via ORAL
  Filled 2015-08-28: qty 2

## 2015-08-28 MED ORDER — AZITHROMYCIN 250 MG PO TABS
500.0000 mg | ORAL_TABLET | Freq: Every day | ORAL | Status: DC
Start: 2015-08-28 — End: 2015-08-28
  Administered 2015-08-28: 500 mg via ORAL
  Filled 2015-08-28: qty 2

## 2015-08-28 MED ORDER — ENOXAPARIN SODIUM 40 MG/0.4ML ~~LOC~~ SOLN
40.0000 mg | Freq: Every day | SUBCUTANEOUS | Status: DC
  Administered 2015-08-28 – 2015-08-31 (×4): 40 mg via SUBCUTANEOUS
  Filled 2015-08-28 (×5): qty 0.4

## 2015-08-28 MED ORDER — POTASSIUM CHLORIDE CRYS ER 20 MEQ PO TBCR
40.0000 meq | EXTENDED_RELEASE_TABLET | ORAL | Status: AC
  Administered 2015-08-28 (×2): 40 meq via ORAL
  Filled 2015-08-28 (×2): qty 2

## 2015-08-28 MED ORDER — ALPRAZOLAM 0.25 MG PO TABS
0.2500 mg | ORAL_TABLET | Freq: Three times a day (TID) | ORAL | Status: DC | PRN
  Administered 2015-08-28: 0.25 mg via ORAL
  Filled 2015-08-28 (×3): qty 1

## 2015-08-28 MED ORDER — IPRATROPIUM-ALBUTEROL 0.5-2.5 (3) MG/3ML IN SOLN
3.0000 mL | Freq: Four times a day (QID) | RESPIRATORY_TRACT | Status: AC
  Administered 2015-08-28 – 2015-08-29 (×3): 3 mL via RESPIRATORY_TRACT
  Filled 2015-08-28 (×4): qty 3

## 2015-08-28 MED ORDER — ASPIRIN 325 MG PO TABS
325.0000 mg | ORAL_TABLET | Freq: Every day | ORAL | Status: DC
  Administered 2015-08-28 – 2015-09-01 (×5): 325 mg via ORAL
  Filled 2015-08-28 (×5): qty 1

## 2015-08-28 MED ORDER — FUROSEMIDE 40 MG PO TABS: 40.0000 mg | ORAL_TABLET | Freq: Two times a day (BID) | ORAL | Status: DC

## 2015-08-28 MED ORDER — DOXYCYCLINE HYCLATE 100 MG PO TABS
100.0000 mg | ORAL_TABLET | Freq: Two times a day (BID) | ORAL | Status: DC
  Administered 2015-08-28 – 2015-09-01 (×7): 100 mg via ORAL
  Filled 2015-08-28 (×7): qty 1

## 2015-08-28 MED ORDER — METFORMIN HCL 500 MG PO TABS
1000.0000 mg | ORAL_TABLET | Freq: Two times a day (BID) | ORAL | Status: DC
  Administered 2015-08-28 – 2015-08-29 (×4): 1000 mg via ORAL
  Filled 2015-08-28 (×4): qty 2

## 2015-08-28 MED ORDER — ZOLPIDEM TARTRATE 5 MG PO TABS
10.0000 mg | ORAL_TABLET | Freq: Every evening | ORAL | Status: DC | PRN
Start: 2015-08-28 — End: 2015-09-01
  Administered 2015-08-28 – 2015-08-31 (×3): 10 mg via ORAL
  Filled 2015-08-28 (×3): qty 2

## 2015-08-28 MED ORDER — ESCITALOPRAM OXALATE 10 MG PO TABS
20.0000 mg | ORAL_TABLET | Freq: Every day | ORAL | Status: DC
Start: 2015-08-28 — End: 2015-09-01
  Administered 2015-08-28 – 2015-09-01 (×5): 20 mg via ORAL
  Filled 2015-08-28 (×5): qty 2

## 2015-08-28 NOTE — ED Notes (Signed)
Pt complains of pain to left ac int. Int removed intact, new iv site initiated. Pt denies pain to new iv site.

## 2015-08-28 NOTE — Progress Notes (Signed)
PHARMACY - CRITICAL CARE PROGRESS NOTE  Pharmacy Consult for Electrolyte Monitoring  Patient Measurements: Height: 5\' 9"  (175.3 cm) Weight: 242 lb 1 oz (109.799 kg) IBW/kg (Calculated) : 70.7  Vital Signs: Temp: 97.8 F (36.6 C) (11/24 0237) Temp Source: Oral (11/24 0237) BP: 83/64 mmHg (11/24 0820) Pulse Rate: 74 (11/24 0631)    Labs:  Recent Labs  08/27/15 2317 08/28/15 0307 08/28/15 0920  WBC 10.1 8.1 6.5  HGB 12.9* 12.1* 12.2*  HCT 38.8* 36.2* 36.7*  PLT 135* 118* 119*  CREATININE 1.05 1.28* 1.14  MG  --  1.7  --   ALBUMIN 3.8  --   --   PROT 6.5  --   --   AST 24  --   --   ALT 18  --   --   ALKPHOS 48  --   --   BILITOT 0.7  --   --    Estimated Creatinine Clearance: 86.2 mL/min (by C-G formula based on Cr of 1.14).  Lab Results  Component Value Date   NA 136 08/28/2015   K 2.9* 08/28/2015   CL 95* 08/28/2015   CO2 31 08/28/2015     Recent Labs  08/28/15 0314 08/28/15 0615 08/28/15 0811  GLUCAP 160* 120* 115*    Assessment: Pharmacy consulted for electrolyte monitoring and managing.  K+ today 2.9 and Mg low end of normal at 1.7   Plan:  Dr. Manuella Ghazi already ordered 80 mEq PO. Will order Mg 1g since low end of normal on lab lab draw.  Potassium level ordered for 1800 tonight. Pharmacy will continue to monitor electrolytes and make adjustments as needed.   Nancy Fetter, PharmD Pharmacy Resident 08/28/2015,11:27 AM

## 2015-08-28 NOTE — Consult Note (Signed)
[facility] Consultation Note  Patient ID: Ronald Miller., MRN: [id number], DOB/AGE: 1955/12/22 59 y.o. Admit date: 08/27/2015   Date of Consult: 08/28/2015 Primary Physician: Placido Sou, MD Primary Cardiologist: Nehemiah Massed  Chief Complaint:  Chief Complaint  Patient presents with  . Pacemaker Problem  . Cough   Reason for Consult: acute chest pain  HPI: 59 y.o. male with known severe dilated cardiomyopathy with chronic systolic dysfunction congestive heart failure stable at this time on appropriate previous medication management and known coronary artery disease diabetes with complication and essential hypertension who has moved from [place] to [place] and has not been able to get all of his medications and taking them correctly. The patient has previously been been appropriate medication management for high intensity cholesterol therapy with atorvastatin as well as furosemide for continued congestive heart failure and metoprolol for LV systolic dysfunction. Without taking some of these medications the patient had a defibrillation of unknown etiology but has recently not had any evidence of chest pressure pain or other significant symptoms suggest sending exacerbation of heart failure or myocardial ischemia or anginal equivalent. After defibrillation the patient did have some chest discomfort which is typical for this issue. Troponin did elevate to 0.06 most consistent with possible defibrillation. Patient is feeling quite well at this time with no other significant symptoms.  Past Medical History  Diagnosis Date  . ASCVD (arteriosclerotic cardiovascular disease)   . Diabetes mellitus   . Hypertension   . History of cocaine abuse   . Tobacco abuse   . Depression   . Suicidal ideation   . Chronic systolic CHF (congestive heart failure) ([place])   . COPD (chronic obstructive pulmonary disease) ([place])   . Coronary artery disease       Surgical History:  Past Surgical  History  Procedure Laterality Date  . Coronary artery bypass graft  2001  . Lumbar disc surgery  02/1999    Discectomy and fusion  . Vasectomy      Subsequent reversal  . Insert / replace / remove pacemaker    . Cardiac defibrillator placement       Home Meds: Prior to Admission medications   Medication Sig Start Date End Date Taking? Authorizing Provider  albuterol (PROVENTIL HFA;VENTOLIN HFA) 108 (90 BASE) MCG/ACT inhaler Inhale 2 puffs into the lungs every 6 (six) hours as needed for wheezing or shortness of breath.   Yes Historical Provider, MD  aspirin 325 MG tablet Take 325 mg by mouth daily.     Yes Historical Provider, MD  atorvastatin (LIPITOR) 20 MG tablet Take 20 mg by mouth every evening.   Yes Historical Provider, MD  baclofen (LIORESAL) 10 MG tablet Take 10 mg by mouth 3 (three) times daily.   Yes Historical Provider, MD  benazepril (LOTENSIN) 5 MG tablet Take 5 mg by mouth daily.   Yes Historical Provider, MD  escitalopram (LEXAPRO) 20 MG tablet Take 20 mg by mouth daily.   Yes Historical Provider, MD  furosemide (LASIX) 40 MG tablet Take 40 mg by mouth 2 (two) times daily. Take an additional 40mg   In AM if weight over 4 lbs   Yes Historical Provider, MD  insulin aspart (NOVOLOG) 100 UNIT/ML injection Inject 10 Units into the skin 3 (three) times daily before meals.   Yes Historical Provider, MD  insulin glargine (LANTUS) 100 UNIT/ML injection Inject 50 Units into the skin at bedtime.   Yes Historical Provider, MD  metFORMIN (GLUCOPHAGE) 1000 MG tablet Take  1,000 mg by mouth 2 (two) times daily.   Yes Historical Provider, MD  metolazone (ZAROXOLYN) 5 MG tablet Take 5 mg by mouth daily as needed (for 4-5 pound weight gain).   Yes Historical Provider, MD  metoprolol succinate (TOPROL-XL) 100 MG 24 hr tablet Take 100 mg by mouth daily. Take with or immediately following a meal.   Yes Historical Provider, MD  potassium chloride SA (K-DUR,KLOR-CON) 20 MEQ tablet Take 20 mEq by  mouth daily.   Yes Historical Provider, MD  spironolactone (ALDACTONE) 50 MG tablet Take 50 mg by mouth daily.   Yes Historical Provider, MD    Inpatient Medications:  . aspirin  325 mg Oral Daily  . atorvastatin  20 mg Oral QPM  . azithromycin  500 mg Oral Daily  . benazepril  5 mg Oral Daily  . enoxaparin (LOVENOX) injection  40 mg Subcutaneous QHS  . escitalopram  20 mg Oral Daily  . furosemide  40 mg Oral BID  . insulin aspart  0-9 Units Subcutaneous Q6H  . metFORMIN  1,000 mg Oral BID WC  . metoprolol succinate  100 mg Oral Daily  . sodium chloride  3 mL Intravenous Q12H  . spironolactone  50 mg Oral Daily      Allergies:  Allergies  Allergen Reactions  . Codeine Nausea And Vomiting    Social History   Social History  . Marital Status: Single    Spouse Name: N/A  . Number of Children: N/A  . Years of Education: N/A   Occupational History  . Retired   . Truck driver    Social History Main Topics  . Smoking status: Smoker, Current Status Unknown -- 1.50 packs/day for 23 years    Types: Cigarettes  . Smokeless tobacco: Never Used  . Alcohol Use: No  . Drug Use: No     Comment: Former  . Sexual Activity: Not on file   Other Topics Concern  . Not on file   Social History Narrative   Divorced with one child   No regular exercise     Family History  Problem Relation Age of Onset  . CAD    . Diabetes       Review of Systems Positive for chest discomfort shortness of breath Negative for: General:  chills, fever, night sweats or weight changes.  Cardiovascular: PND orthopnea syncope dizziness  Dermatological skin lesions rashes Respiratory: Cough congestion Urologic: Frequent urination urination at night and hematuria Abdominal: negative for nausea, vomiting, diarrhea, bright red blood per rectum, melena, or hematemesis Neurologic: negative for visual changes, and/or hearing changes  All other systems reviewed and are otherwise negative except as  noted above.  Labs:  Recent Labs  08/27/15 2317 08/28/15 0307  TROPONINI 0.06* 0.07*   Lab Results  Component Value Date   WBC 8.1 08/28/2015   HGB 12.1* 08/28/2015   HCT 36.2* 08/28/2015   MCV 91.8 08/28/2015   PLT 118* 08/28/2015    Recent Labs Lab 08/27/15 2317 08/28/15 0307  NA 137  --   K 2.6*  --   CL 99*  --   CO2 30  --   BUN 23*  --   CREATININE 1.05 1.28*  CALCIUM 8.4*  --   PROT 6.5  --   BILITOT 0.7  --   ALKPHOS 48  --   ALT 18  --   AST 24  --   GLUCOSE 122*  --    Lab Results  Component Value  Date   CHOL 153 08/15/2013   HDL 37* 08/15/2013   LDLCALC 89 08/15/2013   TRIG 133 08/15/2013   No results found for: DDIMER  Radiology/Studies:  Dg Chest Port 1 View  08/27/2015  CLINICAL DATA:  Initial evaluation for acute cough, chest pain. EXAM: PORTABLE CHEST 1 VIEW COMPARISON:  Prior study from 04/09/2014. FINDINGS: Median sternotomy wires with underlying CABG markers and surgical clips noted. Dehiscence of a few of the sternotomy wires noted, stable. Left-sided transvenous pacemaker/ AICD is item changed. Cardiomegaly is unchanged. Mediastinal silhouette within normal limits. Lungs are normally inflated. Mild vascular congestion without overt pulmonary edema. No pleural effusion. Mild scattered left basilar opacity, favored to reflect atelectasis, possible developing infiltrate could be considered in the correct clinical setting. No pneumothorax. No acute osseus abnormality. IMPRESSION: 1. Stable cardiomegaly with mild pulmonary vascular congestion without overt pulmonary edema. 2. Mild left basilar opacity, favored to reflect atelectasis, although possible early/developing infiltrate could be considered in the correct clinical setting. Electronically Signed   By: Jeannine Boga M.D.   On: 08/27/2015 23:38    EKG: Paced rhythm  Weights: Filed Weights   08/27/15 2313  Weight: 242 lb 1 oz (109.799 kg)     Physical Exam: Blood pressure 89/70,  pulse 74, temperature 97.8 F (36.6 C), temperature source Oral, resp. rate 24, height 5\' 9"  (1.753 m), weight 242 lb 1 oz (109.799 kg), SpO2 96 %. Body mass index is 35.73 kg/(m^2). General: Well developed, well nourished, in no acute distress. Head eyes ears nose throat: Normocephalic, atraumatic, sclera non-icteric, no xanthomas, nares are without discharge. No apparent thyromegaly and/or mass  Lungs: Normal respiratory effort.  Few wheezes, no rales, no rhonchi.  Heart: RRR with normal S1 S2. no murmur gallop, no rub, PMI is normal size and placement, carotid upstroke normal without bruit, jugular venous pressure is normal Abdomen: Soft, non-tender, non-distended with normoactive bowel sounds. No hepatomegaly. No rebound/guarding. No obvious abdominal masses. Abdominal aorta is normal size without bruit Extremities: Trace edema. no cyanosis, no clubbing, no ulcers  Peripheral : 2+ bilateral upper extremity pulses, 2+ bilateral femoral pulses, 2+ bilateral dorsal pedal pulse Neuro: Alert and oriented. No facial asymmetry. No focal deficit. Moves all extremities spontaneously. Musculoskeletal: Normal muscle tone without kyphosis Psych:  Responds to questions appropriately with a normal affect.    Assessment: 59 year old male with known chronic systolic dysfunction congestive heart failure coronary artery disease diabetes with complication essential hypertension with acute defibrillation of unknown etiology most likely secondary to inability to take medications and now restored with medications feeling fine without evidence of myocardial infarction or exacerbation of congestive heart failure  Plan: 1. Begin ambulation and follow for improvements of symptoms and now reinstated on appropriate medications 2. Continue metoprolol for further risk reduction and defibrillation 3. Continue high intensity cholesterol therapy with atorvastatin 4. Lasix for heart failure and pulmonary edema 5. Okay for  discharge to home with follow-up with further interrogation in adjustments of medication management on Friday  Signed, Corey Skains M.D. [facility] 08/28/2015, 7:11 AM

## 2015-08-28 NOTE — Progress Notes (Signed)
Patient arrived on unit at 1045 from floor due to hypotension. Patient's SBP remained in the 80's for the remainder of the shift, but has risen to 89 since the 250 mL bolus of NS. Pacemaker appears to not be capturing and patient states that he has been "feeling funny" since the AM.  A&Ox4 with no mental status changes. Denies chest pain. Resting since PRN xanax was given for anxiety. MAP has maintained above 60.  Chanteria Haggard B This note was started by Aldona Bar, RN and finished by Kendrick Ranch, RNKendrick Ranch, RN was preceptor.

## 2015-08-28 NOTE — ED Notes (Signed)
Pt up to restroom to void.  

## 2015-08-28 NOTE — ED Notes (Signed)
Pt continues to complain of chest pressure, "it's getting better." pt states pressure is worse with movement and with coughing. md notified.

## 2015-08-28 NOTE — Progress Notes (Signed)
Spoke with  Dr. Manuella Ghazi to make aware patient blood pressure remains low post 500cc bolus. Current bp 83/64. Patient has a history of CHF. Per md transfer to ICU

## 2015-08-28 NOTE — Progress Notes (Signed)
Spoke with dr. Manuella Ghazi to make aware potassium is 2.9 per md will place order. Called icu and spoke with samantha to give report. Patient status as well as vitals and lab values gone over with nurse. Patient currently in no distress lungs diminished on ra. Orderly called for transport

## 2015-08-28 NOTE — Progress Notes (Signed)
[facility] [place] NAME: Ronald Miller    MR#:  [id number]  DATE OF BIRTH:  Sep 14, 1956  SUBJECTIVE:  CHIEF COMPLAINT:   Chief Complaint  Patient presents with  . Pacemaker Problem  . Cough  persistent hypotension despite some fluid bolus overnight, feeling somewhat short of breath, feels lethargic REVIEW OF SYSTEMS:  Review of Systems  Constitutional: Negative for fever, weight loss, malaise/fatigue and diaphoresis.  HENT: Negative for ear discharge, ear pain, hearing loss, nosebleeds, sore throat and tinnitus.   Eyes: Negative for blurred vision and pain.  Respiratory: Positive for shortness of breath. Negative for cough, hemoptysis and wheezing.   Cardiovascular: Positive for chest pain. Negative for palpitations, orthopnea and leg swelling.  Gastrointestinal: Negative for heartburn, nausea, vomiting, abdominal pain, diarrhea, constipation and blood in stool.  Genitourinary: Negative for dysuria, urgency and frequency.  Musculoskeletal: Negative for myalgias and back pain.  Skin: Negative for itching and rash.  Neurological: Negative for dizziness, tingling, tremors, focal weakness, seizures, weakness and headaches.  Psychiatric/Behavioral: Negative for depression. The patient is not nervous/anxious.    DRUG ALLERGIES:   Allergies  Allergen Reactions  . Codeine Nausea And Vomiting   VITALS:  Blood pressure 83/64, pulse 74, temperature 97.8 F (36.6 C), temperature source Oral, resp. rate 24, height 5\' 9"  (1.753 m), weight 109.799 kg (242 lb 1 oz), SpO2 96 %. PHYSICAL EXAMINATION:  Physical Exam  Constitutional: He is oriented to person, place, and time and well-developed, well-nourished, and in no distress.  HENT:  Head: Normocephalic and atraumatic.  Eyes: Conjunctivae and EOM are normal. Pupils are equal, round, and reactive to light.  Neck: Normal range of motion. Neck supple. No tracheal deviation present. No  thyromegaly present.  Cardiovascular: Normal rate, regular rhythm and normal heart sounds.   Pulmonary/Chest: Effort normal and breath sounds normal. No respiratory distress. He has no wheezes. He exhibits no tenderness.  Abdominal: Soft. Bowel sounds are normal. He exhibits no distension. There is no tenderness.  Musculoskeletal: Normal range of motion.  Neurological: He is alert and oriented to person, place, and time. No cranial nerve deficit.  Skin: Skin is warm and dry. No rash noted.  Psychiatric: Mood and affect normal.   LABORATORY PANEL:   CBC  Recent Labs Lab 08/28/15 0920  WBC 6.5  HGB 12.2*  HCT 36.7*  PLT 119*   ------------------------------------------------------------------------------------------------------------------ Chemistries   Recent Labs Lab 08/27/15 2317 08/28/15 0307 08/28/15 0920  NA 137  --  136  K 2.6*  --  2.9*  CL 99*  --  95*  CO2 30  --  31  GLUCOSE 122*  --  244*  BUN 23*  --  30*  CREATININE 1.05 1.28* 1.14  CALCIUM 8.4*  --  8.6*  MG  --  1.7  --   AST 24  --   --   ALT 18  --   --   ALKPHOS 48  --   --   BILITOT 0.7  --   --    RADIOLOGY:  Dg Chest Port 1 View  08/27/2015  CLINICAL DATA:  Initial evaluation for acute cough, chest pain. EXAM: PORTABLE CHEST 1 VIEW COMPARISON:  Prior study from 04/09/2014. FINDINGS: Median sternotomy wires with underlying CABG markers and surgical clips noted. Dehiscence of a few of the sternotomy wires noted, stable. Left-sided transvenous pacemaker/ AICD is item changed. Cardiomegaly is unchanged. Mediastinal silhouette within normal limits. Lungs are normally inflated. Mild  vascular congestion without overt pulmonary edema. No pleural effusion. Mild scattered left basilar opacity, favored to reflect atelectasis, possible developing infiltrate could be considered in the correct clinical setting. No pneumothorax. No acute osseus abnormality. IMPRESSION: 1. Stable cardiomegaly with mild pulmonary  vascular congestion without overt pulmonary edema. 2. Mild left basilar opacity, favored to reflect atelectasis, although possible early/developing infiltrate could be considered in the correct clinical setting. Electronically Signed   By: Jeannine Boga M.D.   On: 08/27/2015 23:38   ASSESSMENT AND PLAN:  59 year old male with known chronic systolic dysfunction congestive heart failure coronary artery disease diabetes with complication essential hypertension , admitted after AICD firing  * Persistent Hypotension: Despite about 500 ml - 1 Liter Bolus.  Hesitant to give more fluids.  Considering his initial x-ray worrisome for pulmonary vascular congestion with underlying history of CHF.  Will monitor in stepdown as he may need pressors.  Consider Levophed for mean arterial pressure less than 60 or systolic pressure less than 70. He does feel somewhat lethargic likely due to same.  * Severe hypokalemia: Replete and recheck.  Check magnesium - d/w pharmacist - they will manage electrolytes per ICU protocol.  * Chest pain - Appreciate cardiology input. AICD interrogation per Cardio if felt need.  * Elevated troponin - due to supply demand ischemia in the setting of chronic systolic heart failure, or potentially AICDfiring. Monitor him on Tele.  * AICD discharge - d/w cardiology for AICD interrogation.  * Type 2 diabetes mellitus (HCC) - nothing by mouth for now, sliding scale insulin and corresponding fingerstick glucose checks every 6 hours until he is eating again, which client will need to be changed to before meals at bedtime. Check hemoglobin A1c.  * Chronic systolic heart failure (HCC) - Hold lasix, Aldactone, Metoprolol, Lotensin due to persistent Hypotension  * COPD (chronic obstructive pulmonary disease) (HCC) - continue azithromycin, DuoNeb's when necessary.  * Depression - continue LEXAPRO   Will transfer him to CCU stepdown for potential need for pressors with persistent  hypotension.  Discussed with Dr. Stevenson Clinch (consult Intensivist)  All the records are reviewed and case discussed with Care Management/Social Worker. Management plans discussed with the patient and he is in agreement.  CODE STATUS: Full Code  TOTAL TIME TAKING CARE OF THIS PATIENT: 35 minutes.   More than 50% of the time was spent in counseling/coordination of care: YES  POSSIBLE D/C IN 1-2 DAYS, DEPENDING ON CLINICAL CONDITION.   [facility], [place] Devine M.D on 08/28/2015 at 10:50 AM  Between 7am to 6pm - Pager - [phone]  After 6pm go to [email or web address] - password [email or web address] [facility]  [facility]  Office  [phone]  CC: Primary care physician; Placido Sou, MD

## 2015-08-28 NOTE — Progress Notes (Signed)
RN notified Dr. Manuella Ghazi and asked him to change patient out of observation status and made MD aware of K+ 2.9,  Dr. Manuella Ghazi stated that he cant do anything about it that intake is responsible for observation status and MD gave no new orders at this time for K+.

## 2015-08-28 NOTE — Progress Notes (Signed)
PHARMACY - CRITICAL CARE PROGRESS NOTE  Pharmacy Consult for Electrolyte Monitoring  Patient Measurements: Height: 5\' 9"  (175.3 cm) Weight: 242 lb 1 oz (109.799 kg) IBW/kg (Calculated) : 70.7  Vital Signs: Temp: 97.5 F (36.4 C) (11/24 2010) Temp Source: Oral (11/24 2010) BP: 89/79 mmHg (11/24 2000) Pulse Rate: 77 (11/24 2000)    Labs:  Recent Labs  08/27/15 2317 08/28/15 0307 08/28/15 0920 08/28/15 1154  WBC 10.1 8.1 6.5  --   HGB 12.9* 12.1* 12.2*  --   HCT 38.8* 36.2* 36.7*  --   PLT 135* 118* 119*  --   CREATININE 1.05 1.28* 1.14  --   MG  --  1.7  --  1.7  ALBUMIN 3.8  --   --   --   PROT 6.5  --   --   --   AST 24  --   --   --   ALT 18  --   --   --   ALKPHOS 48  --   --   --   BILITOT 0.7  --   --   --    Estimated Creatinine Clearance: 86.2 mL/min (by C-G formula based on Cr of 1.14).  Lab Results  Component Value Date   NA 136 08/28/2015   K 3.6 08/28/2015   CL 95* 08/28/2015   CO2 31 08/28/2015     Recent Labs  08/28/15 0811 08/28/15 1135 08/28/15 1623  GLUCAP 115* 227* 163*    Assessment: Pharmacy consulted for electrolyte monitoring and managing.  K+ today 2.9 and Mg low end of normal at 1.7   Plan:  Dr. Manuella Ghazi already ordered 80 mEq PO. Will order Mg 1g since low end of normal on lab lab draw.  Potassium level ordered for 1800 tonight. Pharmacy will continue to monitor electrolytes and make adjustments as needed.   11/24:  K+ @ 18:00 = 3.6 No futher K supplementation needed. Will recheck electrolytes on 11/25 with AM labs.   [place] Resident 08/28/2015,8:38 PM

## 2015-08-28 NOTE — Care Management Obs Status (Signed)
[facility] NOTIFICATION   Patient Details  Name: Ronald Miller. MRN: [id number] Date of Birth: [birth year]   Medicare Observation Status Notification Given:  Yes Given to pt. On unit    Beau Fanny, RN 08/28/2015, 9:17 AM

## 2015-08-28 NOTE — Consult Note (Signed)
PULMONARY / CRITICAL CARE MEDICINE   Name: Ronald Miller. MRN: [id number] DOB: 1956/03/31    ADMISSION DATE:  08/27/2015 CONSULTATION DATE:  08/28/15  REFERRING MD :  Dr. Max Sane   CHIEF COMPLAINT:   Low blood pressure, cough   HISTORY OF PRESENT ILLNESS    59 y.o. male who presents with chest pain after AICD discharge. Patient states that the AICD discharge woke up from his sleep. Since that time he has had chest discomfort which he describes as chest tightness located central chest area. He does not identify this chest pain is exertional, and denies any other associated symptoms including radiation, nausea/vomiting, dizziness, diaphoresis. Patient states that he has a history of chronic systolic congestive heart failure, but that he recognizes symptoms of heart failure exacerbation and does not feel like that is what is going on at this time. He does have a history of COPD as well, and states that he has had a significant cough for the last 2 months which just does not seem to want to improve. He occasionally coughs up some thick whitish sputum. He states that he does have fits of coughing and when she will almost lose consciousness due to such forceful coughing. He was admitted to the medical floor, but noted to have continued systolic blood pressure in the 80s, which is unusual for him. He will low systolic blood pressure he was is mentate well, given the low systolic blood pressure and a history of heart failure he was transferred to the ICU for closer monitoring. In the ICU he is mentating well, his MAP is in the 70s by cuff pressure, he does have slight lightheadedness. He states that his blood pressure usually runs in the 110s.    SIGNIFICANT EVENTS     PAST MEDICAL HISTORY    :  Past Medical History  Diagnosis Date  . ASCVD (arteriosclerotic cardiovascular disease)   . Diabetes mellitus   . Hypertension   . History of cocaine abuse   . Tobacco abuse   . Depression    . Suicidal ideation   . Chronic systolic CHF (congestive heart failure) ([place])   . COPD (chronic obstructive pulmonary disease) ([place])   . Coronary artery disease    Past Surgical History  Procedure Laterality Date  . Coronary artery bypass graft  2001  . Lumbar disc surgery  02/1999    Discectomy and fusion  . Vasectomy      Subsequent reversal  . Insert / replace / remove pacemaker    . Cardiac defibrillator placement     Prior to Admission medications   Medication Sig Start Date End Date Taking? Authorizing Provider  albuterol (PROVENTIL HFA;VENTOLIN HFA) 108 (90 BASE) MCG/ACT inhaler Inhale 2 puffs into the lungs every 6 (six) hours as needed for wheezing or shortness of breath.   Yes Historical Provider, MD  aspirin 325 MG tablet Take 325 mg by mouth daily.     Yes Historical Provider, MD  atorvastatin (LIPITOR) 20 MG tablet Take 20 mg by mouth every evening.   Yes Historical Provider, MD  baclofen (LIORESAL) 10 MG tablet Take 10 mg by mouth 3 (three) times daily.   Yes Historical Provider, MD  benazepril (LOTENSIN) 5 MG tablet Take 5 mg by mouth daily.   Yes Historical Provider, MD  escitalopram (LEXAPRO) 20 MG tablet Take 20 mg by mouth daily.   Yes Historical Provider, MD  furosemide (LASIX) 40 MG tablet Take 40 mg by mouth 2 (two)  times daily. Take an additional 40mg   In AM if weight over 4 lbs   Yes Historical Provider, MD  insulin aspart (NOVOLOG) 100 UNIT/ML injection Inject 10 Units into the skin 3 (three) times daily before meals.   Yes Historical Provider, MD  insulin glargine (LANTUS) 100 UNIT/ML injection Inject 50 Units into the skin at bedtime.   Yes Historical Provider, MD  metFORMIN (GLUCOPHAGE) 1000 MG tablet Take 1,000 mg by mouth 2 (two) times daily.   Yes Historical Provider, MD  metolazone (ZAROXOLYN) 5 MG tablet Take 5 mg by mouth daily as needed (for 4-5 pound weight gain).   Yes Historical Provider, MD  metoprolol succinate (TOPROL-XL) 100 MG 24 hr tablet Take  100 mg by mouth daily. Take with or immediately following a meal.   Yes Historical Provider, MD  potassium chloride SA (K-DUR,KLOR-CON) 20 MEQ tablet Take 20 mEq by mouth daily.   Yes Historical Provider, MD  spironolactone (ALDACTONE) 50 MG tablet Take 50 mg by mouth daily.   Yes Historical Provider, MD   Allergies  Allergen Reactions  . Codeine Nausea And Vomiting     FAMILY HISTORY   Family History  Problem Relation Age of Onset  . CAD    . Diabetes        SOCIAL HISTORY    reports that he has been smoking Cigarettes.  He has a 34.5 pack-year smoking history. He has never used smokeless tobacco. He reports that he does not drink alcohol or use illicit drugs.  Review of Systems  Constitutional: Positive for diaphoresis. Negative for fever, chills and weight loss.  HENT: Positive for sore throat. Negative for congestion.   Eyes: Negative for blurred vision, double vision and photophobia.  Respiratory: Positive for cough and shortness of breath. Negative for hemoptysis, sputum production and wheezing.   Cardiovascular: Positive for chest pain and leg swelling.  Gastrointestinal: Negative for heartburn, nausea, vomiting and abdominal pain.  Skin: Negative for itching and rash.  Neurological: Positive for dizziness and weakness.      VITAL SIGNS    Temp:  [97.7 F (36.5 C)-98.3 F (36.8 C)] 97.9 F (36.6 C) (11/24 1200) Pulse Rate:  [62-85] 72 (11/24 1230) Resp:  [16-25] 22 (11/24 1230) BP: (73-114)/(58-73) 79/70 mmHg (11/24 1230) SpO2:  [92 %-98 %] 97 % (11/24 1230) Weight:  [242 lb 1 oz (109.799 kg)] 242 lb 1 oz (109.799 kg) (11/23 2313) HEMODYNAMICS:   VENTILATOR SETTINGS:   INTAKE / OUTPUT:  Intake/Output Summary (Last 24 hours) at 08/28/15 1259 Last data filed at 08/28/15 1141  Gross per 24 hour  Intake    340 ml  Output      0 ml  Net    340 ml       PHYSICAL EXAM   Physical Exam  Constitutional: He is oriented to person, place, and time. He  appears well-developed and well-nourished.  HENT:  Head: Normocephalic and atraumatic.  Right Ear: External ear normal.  Left Ear: External ear normal.  Mouth/Throat: Oropharynx is clear and moist.  Eyes: Conjunctivae and EOM are normal. Pupils are equal, round, and reactive to light.  Neck: Normal range of motion. Neck supple.  Pulmonary/Chest: Effort normal and breath sounds normal.  Faint upper lobe wheezes  Abdominal: Soft. Bowel sounds are normal.  Musculoskeletal:  Trace pedal edema  Neurological: He is alert and oriented to person, place, and time.  Skin: Skin is warm and dry.  Psychiatric: He has a normal mood and affect.  Nursing note and vitals reviewed.      LABS   LABS:  CBC  Recent Labs Lab 08/27/15 2317 08/28/15 0307 08/28/15 0920  WBC 10.1 8.1 6.5  HGB 12.9* 12.1* 12.2*  HCT 38.8* 36.2* 36.7*  PLT 135* 118* 119*   Coag's No results for input(s): APTT, INR in the last 168 hours. BMET  Recent Labs Lab 08/27/15 2317 08/28/15 0307 08/28/15 0920  NA 137  --  136  K 2.6*  --  2.9*  CL 99*  --  95*  CO2 30  --  31  BUN 23*  --  30*  CREATININE 1.05 1.28* 1.14  GLUCOSE 122*  --  244*   Electrolytes  Recent Labs Lab 08/27/15 2317 08/28/15 0307 08/28/15 0920 08/28/15 1154  CALCIUM 8.4*  --  8.6*  --   MG  --  1.7  --  1.7   Sepsis Markers No results for input(s): LATICACIDVEN, PROCALCITON, O2SATVEN in the last 168 hours. ABG No results for input(s): PHART, PCO2ART, PO2ART in the last 168 hours. Liver Enzymes  Recent Labs Lab 08/27/15 2317  AST 24  ALT 18  ALKPHOS 48  BILITOT 0.7  ALBUMIN 3.8   Cardiac Enzymes  Recent Labs Lab 08/28/15 0307 08/28/15 0920 08/28/15 1154  TROPONINI 0.07* 0.06* 0.05*   Glucose  Recent Labs Lab 08/28/15 0314 08/28/15 0615 08/28/15 0811 08/28/15 1135  GLUCAP 160* 120* 115* 227*     Recent Results (from the past 240 hour(s))  MRSA PCR Screening     Status: Abnormal   Collection Time:  08/28/15 10:41 AM  Result Value Ref Range Status   MRSA by PCR POSITIVE (A) NEGATIVE Final    Comment:        The GeneXpert MRSA Assay (FDA approved for NASAL specimens only), is one component of a comprehensive MRSA colonization surveillance program. It is not intended to diagnose MRSA infection nor to guide or monitor treatment for MRSA infections. CRITICAL RESULT CALLED TO, READ BACK BY AND VERIFIED WITH: SAMANTHA ROUSE AT 1229 08/28/15 DAS      Current facility-administered medications:  .  acetaminophen (TYLENOL) tablet 650 mg, 650 mg, Oral, Q6H PRN **OR** acetaminophen (TYLENOL) suppository 650 mg, 650 mg, Rectal, Q6H PRN, Lance Coon, MD .  aspirin tablet 325 mg, 325 mg, Oral, Daily, Lance Coon, MD, 325 mg at 08/28/15 0851 .  atorvastatin (LIPITOR) tablet 20 mg, 20 mg, Oral, QPM, Lance Coon, MD .  azithromycin [facility]) tablet 500 mg, 500 mg, Oral, Daily, Lance Coon, MD, 500 mg at 08/28/15 0851 .  enoxaparin (LOVENOX) injection 40 mg, 40 mg, Subcutaneous, QHS, Lance Coon, MD, 40 mg at 08/28/15 0308 .  escitalopram (LEXAPRO) tablet 20 mg, 20 mg, Oral, Daily, Lance Coon, MD, 20 mg at 08/28/15 0851 .  insulin aspart (novoLOG) injection 0-5 Units, 0-5 Units, Subcutaneous, QHS, Jaylise Peek, MD .  insulin aspart (novoLOG) injection 0-9 Units, 0-9 Units, Subcutaneous, TID WC, Aaliyah Cancro, MD, 3 Units at 08/28/15 1235 .  ipratropium-albuterol (DUONEB) 0.5-2.5 (3) MG/3ML nebulizer solution 3 mL, 3 mL, Nebulization, Q4H PRN, Lance Coon, MD, 3 mL at 08/28/15 0311 .  metFORMIN (GLUCOPHAGE) tablet 1,000 mg, 1,000 mg, Oral, BID WC, Lance Coon, MD, 1,000 mg at 08/28/15 0851 .  morphine 2 MG/ML injection 2 mg, 2 mg, Intravenous, Q4H PRN, Lance Coon, MD .  ondansetron [facility]) tablet 4 mg, 4 mg, Oral, Q6H PRN **OR** ondansetron (ZOFRAN) injection 4 mg, 4 mg, Intravenous, Q6H PRN, Lance Coon, MD .  potassium chloride SA (K-DUR,KLOR-CON) CR tablet 40 mEq, 40 mEq, Oral,  Q4H, Max Sane, MD, 40 mEq at 08/28/15 1147 .  sodium chloride 0.9 % injection 3 mL, 3 mL, Intravenous, Q12H, Lance Coon, MD, 3 mL at 08/28/15 [id number]  IMAGING    Dg Chest Port 1 View  08/28/2015  CLINICAL DATA:  Shortness of breath and chest pain, defibrillator discharged last night. EXAM: PORTABLE CHEST 1 VIEW COMPARISON:  Chest x-rays dated 08/27/2015 and 04/09/2014. FINDINGS: Cardiomegaly is unchanged. Left chest wall pacemaker/AICD appear stable in position. No evidence of wire discontinuity There is no evidence of acute infiltrate on today's exam. Subtle opacity at the left lung base appears similar to previous chest x-rays from 2015 indicating chronic scarring or atelectasis. Lungs are otherwise clear. No evidence of active/acute congestive heart failure. No confluent opacity to suggest a developing pneumonia. No pleural effusion seen. IMPRESSION: No evidence of acute cardiopulmonary abnormality. No evidence of acute/active congestive heart failure or pneumonia on today's exam. Cardiomegaly is stable. Electronically Signed   By: Franki Cabot M.D.   On: 08/28/2015 11:27   Dg Chest Port 1 View  08/27/2015  CLINICAL DATA:  Initial evaluation for acute cough, chest pain. EXAM: PORTABLE CHEST 1 VIEW COMPARISON:  Prior study from 04/09/2014. FINDINGS: Median sternotomy wires with underlying CABG markers and surgical clips noted. Dehiscence of a few of the sternotomy wires noted, stable. Left-sided transvenous pacemaker/ AICD is item changed. Cardiomegaly is unchanged. Mediastinal silhouette within normal limits. Lungs are normally inflated. Mild vascular congestion without overt pulmonary edema. No pleural effusion. Mild scattered left basilar opacity, favored to reflect atelectasis, possible developing infiltrate could be considered in the correct clinical setting. No pneumothorax. No acute osseus abnormality. IMPRESSION: 1. Stable cardiomegaly with mild pulmonary vascular congestion without overt  pulmonary edema. 2. Mild left basilar opacity, favored to reflect atelectasis, although possible early/developing infiltrate could be considered in the correct clinical setting. Electronically Signed   By: Jeannine Boga M.D.   On: 08/27/2015 23:38      Indwelling Urinary Catheter continued, requirement due to   Reason to continue Indwelling Urinary Catheter for strict Intake/Output monitoring for hemodynamic instability   Central Line continued, requirement due to   Reason to continue [employer] Monitoring of central venous pressure or other hemodynamic parameters   Ventilator continued, requirement due to, resp failure    Ventilator Sedation RASS 0 to -2   Cultures: BCx2  UC  Sputum  Antibiotics:  Lines:   ASSESSMENT/PLAN  59 year old male with chronic systolic heart failure, coronary artery disease, essential hypertension, status post defibrillator, transferred to the ICU for hypotension.  PULMONARY Mild shortness of breath, cough -Chest x-ray with no infiltrates or vascular congestion, no signs of fluid overload or pulmonary edema -Remote history of COPD -Only on as needed albuterol at home -I believe that his current dyspnea, which is mild, is related more to cardiac/congestive heart failure than primary pulmonary issues -Continue with observation, shortness of breath increases may consider adding Pulmicort nebulizers -He does have some faint upper lobe wheezes, will schedule duo nebs for one day and then continue as needed  CARDIOVASCULAR Systolic heart failure status post defibrillator Coronary artery disease Essential Hypertension -Monitor blood pressure closely, maintain map greater than 60, will give [id number] mL bolus -Systolic blood pressure is currently soft, not sure he'll be able to tolerate Lasix or beta blockers, after bolus may need to address -Unknown reason for AICD discharge, his troponins are mildly elevated, we'll trend out -Given  stable MAP,  and mentating well, no need for pressors at this time   RENAL -Monitor I's and O's and urine output -Monitor BMP  GASTROINTESTINAL -PPI   I have personally obtained a history, examined the patient, evaluated laboratory and imaging results, formulated the assessment and plan and placed orders.  The Patient requires high complexity decision making for assessment and support, frequent evaluation and titration of therapies, application of advanced monitoring technologies and extensive interpretation of multiple databases.  Pulmonary consult time devoted to patient care services described in this note is 45 minutes.   Overall, patient is critically ill, prognosis is guarded. Patient at high risk for cardiac arrest and death.   Vilinda Boehringer, MD [facility] Pager [phone] (please enter 7-digits) On Call Pager [phone] (please enter 7-digits)     08/28/2015, 12:59 PM  Note: This note was prepared with Dragon dictation along with smaller phrase technology. Any transcriptional errors that result from this process are unintentional.

## 2015-08-28 NOTE — Progress Notes (Addendum)
RN has spoke with Dr. Stevenson Clinch several times and MD gave order for chest xray and blood cultures when patient first came to ICU  from 2A. MD then gave order regarding patient's low blood pressure and Per Dr. Stevenson Clinch as long as patient's MAP maintains >60 and patient is mentating there is no need call MD, otherwise notify MD.  RN just spoke with MD about patient's sliding scale insulin being q6H and patient has diet ordered. MD gave order for SSI to be AC and HS.

## 2015-08-28 NOTE — ED Notes (Signed)
Dr. Willis in to see pt.

## 2015-08-28 NOTE — H&P (Signed)
Ronald Miller at [place] NAME: Ronald Miller    MR#:  [id number]  DATE OF BIRTH:  [birth year]  DATE OF ADMISSION:  08/27/2015  PRIMARY CARE PHYSICIAN: Placido Sou, MD   REQUESTING/REFERRING PHYSICIAN: Owens Shark, M.D.  CHIEF COMPLAINT:   Chief Complaint  Patient presents with  . Pacemaker Problem  . Cough    HISTORY OF PRESENT ILLNESS:  Ronald Miller  is a 59 y.o. male who presents with chest pain after AICD discharge. Patient states that the AICD discharge woke up from his sleep. Since that time he has had chest discomfort which he describes as chest tightness located central chest area. He does not identify this chest pain is exertional, and denies any other associated symptoms including radiation, nausea/vomiting, dizziness, diaphoresis. Patient states that he has a history of chronic systolic congestive heart failure, but that he recognizes symptoms of heart failure exacerbation and does not feel like that is what is going on at this time. He does have a history of COPD as well, and states that he has had a significant cough for the last 2 months which just does not seem to want to improve. He occasionally coughs up some thick whitish sputum. He states that he does have fits of coughing and when she will almost lose consciousness due to such forceful coughing. Hospitalists were called for admission due to his elevated troponin in the ED in conjunction with his chest pain as well as his AICD firing.  PAST MEDICAL HISTORY:   Past Medical History  Diagnosis Date  . ASCVD (arteriosclerotic cardiovascular disease)   . Diabetes mellitus   . Hypertension   . History of cocaine abuse   . Tobacco abuse   . Depression   . Suicidal ideation   . Chronic systolic CHF (congestive heart failure) ([place])   . COPD (chronic obstructive pulmonary disease) ([place])   . Coronary artery disease     PAST SURGICAL HISTORY:   Past Surgical History  Procedure  Laterality Date  . Coronary artery bypass graft  2001  . Lumbar disc surgery  02/1999    Discectomy and fusion  . Vasectomy      Subsequent reversal  . Insert / replace / remove pacemaker    . Cardiac defibrillator placement      SOCIAL HISTORY:   Social History  Substance Use Topics  . Smoking status: Smoker, Current Status Unknown -- 1.50 packs/day for 23 years    Types: Cigarettes  . Smokeless tobacco: Never Used  . Alcohol Use: No    FAMILY HISTORY:   Family History  Problem Relation Age of Onset  . CAD    . Diabetes      DRUG ALLERGIES:   Allergies  Allergen Reactions  . Codeine Nausea And Vomiting    MEDICATIONS AT HOME:   Prior to Admission medications   Medication Sig Start Date End Date Taking? Authorizing Provider  albuterol (PROVENTIL HFA;VENTOLIN HFA) 108 (90 BASE) MCG/ACT inhaler Inhale 2 puffs into the lungs every 6 (six) hours as needed for wheezing or shortness of breath.   Yes Historical Provider, MD  aspirin 325 MG tablet Take 325 mg by mouth daily.     Yes Historical Provider, MD  atorvastatin (LIPITOR) 20 MG tablet Take 20 mg by mouth every evening.   Yes Historical Provider, MD  baclofen (LIORESAL) 10 MG tablet Take 10 mg by mouth 3 (three) times daily.   Yes Historical Provider, MD  benazepril (  LOTENSIN) 5 MG tablet Take 5 mg by mouth daily.   Yes Historical Provider, MD  escitalopram (LEXAPRO) 20 MG tablet Take 20 mg by mouth daily.   Yes Historical Provider, MD  furosemide (LASIX) 40 MG tablet Take 40 mg by mouth 2 (two) times daily. Take an additional 40mg   In AM if weight over 4 lbs   Yes Historical Provider, MD  insulin aspart (NOVOLOG) 100 UNIT/ML injection Inject 10 Units into the skin 3 (three) times daily before meals.   Yes Historical Provider, MD  insulin glargine (LANTUS) 100 UNIT/ML injection Inject 50 Units into the skin at bedtime.   Yes Historical Provider, MD  metFORMIN (GLUCOPHAGE) 1000 MG tablet Take 1,000 mg by mouth 2 (two)  times daily.   Yes Historical Provider, MD  metolazone (ZAROXOLYN) 5 MG tablet Take 5 mg by mouth daily as needed (for 4-5 pound weight gain).   Yes Historical Provider, MD  metoprolol succinate (TOPROL-XL) 100 MG 24 hr tablet Take 100 mg by mouth daily. Take with or immediately following a meal.   Yes Historical Provider, MD  potassium chloride SA (K-DUR,KLOR-CON) 20 MEQ tablet Take 20 mEq by mouth daily.   Yes Historical Provider, MD  spironolactone (ALDACTONE) 50 MG tablet Take 50 mg by mouth daily.   Yes Historical Provider, MD    REVIEW OF SYSTEMS:  Review of Systems  Constitutional: Negative for fever, chills, weight loss and malaise/fatigue.  HENT: Negative for ear pain, hearing loss and tinnitus.   Eyes: Negative for blurred vision, double vision, pain and redness.  Respiratory: Positive for cough and sputum production. Negative for hemoptysis and shortness of breath.   Cardiovascular: Positive for chest pain. Negative for palpitations, orthopnea and leg swelling.  Gastrointestinal: Negative for nausea, vomiting, abdominal pain, diarrhea and constipation.  Genitourinary: Negative for dysuria, frequency and hematuria.  Musculoskeletal: Negative for back pain, joint pain and neck pain.  Skin:       No acne, rash, or lesions  Neurological: Negative for dizziness, tremors, focal weakness and weakness.  Endo/Heme/Allergies: Negative for polydipsia. Does not bruise/bleed easily.  Psychiatric/Behavioral: Negative for depression. The patient is not nervous/anxious and does not have insomnia.      VITAL SIGNS:   Filed Vitals:   08/27/15 2330 08/27/15 2345 08/28/15 0000 08/28/15 0030  BP: 91/65  93/68 97/69  Pulse: 85 80 77 74  Temp:      TempSrc:      Resp:      Height:      Weight:      SpO2: 94% 94% 92% 93%   Wt Readings from Last 3 Encounters:  08/27/15 109.799 kg (242 lb 1 oz)  05/09/14 105.688 kg (233 lb)  08/06/09 112.492 kg (248 lb)    PHYSICAL EXAMINATION:  Physical  Exam  Vitals reviewed. Constitutional: He is oriented to person, place, and time. He appears well-developed and well-nourished. No distress.  HENT:  Head: Normocephalic and atraumatic.  Mouth/Throat: Oropharynx is clear and moist.  Eyes: Conjunctivae and EOM are normal. Pupils are equal, round, and reactive to light. No scleral icterus.  Neck: Normal range of motion. Neck supple. No JVD present. No thyromegaly present.  Cardiovascular: Normal rate, regular rhythm and intact distal pulses.  Exam reveals no gallop and no friction rub.   No murmur heard. Respiratory: Effort normal. No respiratory distress. He has no wheezes. He has no rales.   Coarse bibasilar breath sounds, right base greater than left base.  GI: Soft. Bowel sounds  are normal. He exhibits no distension. There is no tenderness.  Musculoskeletal: Normal range of motion. He exhibits no edema.  No arthritis, no gout  Lymphadenopathy:    He has no cervical adenopathy.  Neurological: He is alert and oriented to person, place, and time. No cranial nerve deficit.  No dysarthria, no aphasia  Skin: Skin is warm and dry. No rash noted. No erythema.  Psychiatric: He has a normal mood and affect. His behavior is normal. Judgment and thought content normal.    LABORATORY PANEL:   CBC  Recent Labs Lab 08/27/15 2317  WBC 10.1  HGB 12.9*  HCT 38.8*  PLT 135*   ------------------------------------------------------------------------------------------------------------------  Chemistries   Recent Labs Lab 08/27/15 2317  NA 137  K 2.6*  CL 99*  CO2 30  GLUCOSE 122*  BUN 23*  CREATININE 1.05  CALCIUM 8.4*  AST 24  ALT 18  ALKPHOS 48  BILITOT 0.7   ------------------------------------------------------------------------------------------------------------------  Cardiac Enzymes  Recent Labs Lab 08/27/15 2317  TROPONINI 0.06*    ------------------------------------------------------------------------------------------------------------------  RADIOLOGY:  Dg Chest Port 1 View  08/27/2015  CLINICAL DATA:  Initial evaluation for acute cough, chest pain. EXAM: PORTABLE CHEST 1 VIEW COMPARISON:  Prior study from 04/09/2014. FINDINGS: Median sternotomy wires with underlying CABG markers and surgical clips noted. Dehiscence of a few of the sternotomy wires noted, stable. Left-sided transvenous pacemaker/ AICD is item changed. Cardiomegaly is unchanged. Mediastinal silhouette within normal limits. Lungs are normally inflated. Mild vascular congestion without overt pulmonary edema. No pleural effusion. Mild scattered left basilar opacity, favored to reflect atelectasis, possible developing infiltrate could be considered in the correct clinical setting. No pneumothorax. No acute osseus abnormality. IMPRESSION: 1. Stable cardiomegaly with mild pulmonary vascular congestion without overt pulmonary edema. 2. Mild left basilar opacity, favored to reflect atelectasis, although possible early/developing infiltrate could be considered in the correct clinical setting. Electronically Signed   By: Jeannine Boga M.D.   On: 08/27/2015 23:38    EKG:   Orders placed or performed during the hospital encounter of 08/27/15  . EKG 12-Lead  . EKG 12-Lead  . ED EKG  . ED EKG    IMPRESSION AND PLAN:  Principal Problem:   Chest pain - monitor tonight on telemetry, serial troponins as below, cardiology consult for AICD interrogation as below. Active Problems:   Elevated troponin - barely elevated at 0.06. Potentially due to troponin leak in the setting of chronic systolic heart failure, or potentially mildly elevated status post AICD discharge. However, given his chest tightness subsequently, we'll check serial enzymes tonight.   AICD discharge - as above, cardiology consult in the morning for AICD interrogation.   Type 2 diabetes mellitus  (HCC) - nothing by mouth for now, sliding scale insulin and corresponding fingerstick glucose checks every 6 hours until he is eating again, which client will need to be changed to before meals at bedtime. Check hemoglobin A1c.   Chronic systolic heart failure (HCC) - continue home meds   COPD (chronic obstructive pulmonary disease) (HCC) - azithromycin, DuoNeb's when necessary.   Hypokalemia - replace, check magnesium.    HTN (hypertension) - currently at goal, continue home meds.   Depression - continue home meds  All the records are reviewed and case discussed with ED provider. Management plans discussed with the patient and/or family.  DVT PROPHYLAXIS: SubQ lovenox  ADMISSION STATUS: Observation  CODE STATUS: Full  TOTAL TIME TAKING CARE OF THIS PATIENT: 45 minutes.    Jasilyn Holderman FIELDING 08/28/2015,  1:20 AM  [facility]  Office  [phone]  CC: Primary care physician; Placido Sou, MD

## 2015-08-28 NOTE — Progress Notes (Signed)
Patient is alert and oriented x4, no c/o chest pain, BP running low, 92/73 on a sitting position, Skin is intact with scattered scabs, redness to bilateral groins, and generalized discoloration, witnessed by Lexy, RN.  Okay per Dr. Estanislado Pandy to hold insulin, patient is NPO. Will continue to monitor.

## 2015-08-28 NOTE — Progress Notes (Signed)
Dr.kowalski on the floor. md made aware of patient blood pressure 82/62. Lungs clear at this time. Per md hold all bp medications and diuretics.

## 2015-08-28 NOTE — ED Notes (Signed)
Critical troponin and potassium called from lab. Troponin 0.06 and potassium 2.6. Dr. Owens Shark notified.

## 2015-08-28 NOTE — Progress Notes (Signed)
Patients BP low this morning, prime doctor paged.

## 2015-08-28 NOTE — Progress Notes (Signed)
RN spoke with Dr. Ashby Dawes on the phone and made MD aware that patient feels anxious and can not rest.  MD gave order for xanax 0.25mg  PRN TID for anxiety.

## 2015-08-28 NOTE — Progress Notes (Signed)
Per Nursing request - Patient has been changed to Inpt status and K has been replaced.

## 2015-08-28 NOTE — Progress Notes (Signed)
MD, Pyreddy aware of low BP of 89/70 order received for 500 cc bolus, and to hold lasix for now until rounding doctors get here.

## 2015-08-29 ENCOUNTER — Inpatient Hospital Stay: Payer: Medicare HMO

## 2015-08-29 ENCOUNTER — Other Ambulatory Visit: Payer: Self-pay

## 2015-08-29 ENCOUNTER — Encounter: Payer: Self-pay | Admitting: Radiology

## 2015-08-29 DIAGNOSIS — R079 Chest pain, unspecified: Secondary | ICD-10-CM

## 2015-08-29 DIAGNOSIS — I5022 Chronic systolic (congestive) heart failure: Secondary | ICD-10-CM

## 2015-08-29 LAB — CBC
HEMATOCRIT: 35.9 % — AB (ref 40.0–52.0)
HEMOGLOBIN: 12 g/dL — AB (ref 13.0–18.0)
MCH: 30.8 pg (ref 26.0–34.0)
MCHC: 33.5 g/dL (ref 32.0–36.0)
MCV: 92 fL (ref 80.0–100.0)
Platelets: 113 10*3/uL — ABNORMAL LOW (ref 150–440)
RBC: 3.9 MIL/uL — AB (ref 4.40–5.90)
RDW: 13.1 % (ref 11.5–14.5)
WBC: 8.5 10*3/uL (ref 3.8–10.6)

## 2015-08-29 LAB — BASIC METABOLIC PANEL
Anion gap: 7 (ref 5–15)
BUN: 39 mg/dL — ABNORMAL HIGH (ref 6–20)
CHLORIDE: 97 mmol/L — AB (ref 101–111)
CO2: 29 mmol/L (ref 22–32)
Calcium: 8.3 mg/dL — ABNORMAL LOW (ref 8.9–10.3)
Creatinine, Ser: 1.34 mg/dL — ABNORMAL HIGH (ref 0.61–1.24)
GFR calc non Af Amer: 57 mL/min — ABNORMAL LOW (ref >60)
Glucose, Bld: 194 mg/dL — ABNORMAL HIGH (ref 65–99)
POTASSIUM: 3.5 mmol/L (ref 3.5–5.1)
SODIUM: 133 mmol/L — AB (ref 135–145)

## 2015-08-29 LAB — GLUCOSE, CAPILLARY
GLUCOSE-CAPILLARY: 225 mg/dL — AB (ref 65–99)
GLUCOSE-CAPILLARY: 274 mg/dL — AB (ref 65–99)
Glucose-Capillary: 235 mg/dL — ABNORMAL HIGH (ref 65–99)
Glucose-Capillary: 145 mg/dL — ABNORMAL HIGH (ref 65–99)

## 2015-08-29 LAB — CKMB (ARMC ONLY): CK, MB: 2.7 ng/mL (ref 0.5–5.0)

## 2015-08-29 MED ORDER — IPRATROPIUM-ALBUTEROL 0.5-2.5 (3) MG/3ML IN SOLN: 3.0000 mL | Freq: Four times a day (QID) | RESPIRATORY_TRACT | Status: DC | PRN

## 2015-08-29 MED ORDER — IOHEXOL 350 MG/ML SOLN
100.0000 mL | Freq: Once | INTRAVENOUS | Status: AC | PRN
  Administered 2015-08-29: 100 mL via INTRAVENOUS

## 2015-08-29 MED ORDER — MOMETASONE FURO-FORMOTEROL FUM 200-5 MCG/ACT IN AERO
2.0000 | INHALATION_SPRAY | Freq: Two times a day (BID) | RESPIRATORY_TRACT | Status: DC
  Administered 2015-08-29 – 2015-09-01 (×6): 2 via RESPIRATORY_TRACT
  Filled 2015-08-29: qty 8.8

## 2015-08-29 MED ORDER — GUAIFENESIN 100 MG/5ML PO SOLN
5.0000 mL | Freq: Four times a day (QID) | ORAL | Status: DC | PRN
  Administered 2015-08-29: 100 mg via ORAL
  Filled 2015-08-29: qty 10

## 2015-08-29 NOTE — Progress Notes (Signed)
[place] [facility] [place] NAME: Ronald Miller    MR#:  [id number]  DATE OF BIRTH:  Nov 17, 1955  SUBJECTIVE:  CHIEF COMPLAINT:   Chief Complaint  Patient presents with  . Pacemaker Problem  . Cough  Feels much better, blood pressure in 90s REVIEW OF SYSTEMS:  Review of Systems  Constitutional: Negative for fever, weight loss, malaise/fatigue and diaphoresis.  HENT: Negative for ear discharge, ear pain, hearing loss, nosebleeds, sore throat and tinnitus.   Eyes: Negative for blurred vision and pain.  Respiratory: Negative for cough, hemoptysis, shortness of breath and wheezing.   Cardiovascular: Negative for chest pain, palpitations, orthopnea and leg swelling.  Gastrointestinal: Negative for heartburn, nausea, vomiting, abdominal pain, diarrhea, constipation and blood in stool.  Genitourinary: Negative for dysuria, urgency and frequency.  Musculoskeletal: Negative for myalgias and back pain.  Skin: Negative for itching and rash.  Neurological: Negative for dizziness, tingling, tremors, focal weakness, seizures, weakness and headaches.  Psychiatric/Behavioral: Negative for depression. The patient is not nervous/anxious.    DRUG ALLERGIES:   Allergies  Allergen Reactions  . Codeine Nausea And Vomiting   VITALS:  Blood pressure 94/71, pulse 78, temperature 98.2 F (36.8 C), temperature source Oral, resp. rate 18, height 5\' 9"  (1.753 m), weight 109.799 kg (242 lb 1 oz), SpO2 93 %. PHYSICAL EXAMINATION:  Physical Exam  Constitutional: He is oriented to person, place, and time and well-developed, well-nourished, and in no distress.  HENT:  Head: Normocephalic and atraumatic.  Eyes: Conjunctivae and EOM are normal. Pupils are equal, round, and reactive to light.  Neck: Normal range of motion. Neck supple. No tracheal deviation present. No thyromegaly present.  Cardiovascular: Normal rate, regular rhythm and normal heart sounds.    Pulmonary/Chest: Effort normal and breath sounds normal. No respiratory distress. He has no wheezes. He exhibits no tenderness.  Abdominal: Soft. Bowel sounds are normal. He exhibits no distension. There is no tenderness.  Musculoskeletal: Normal range of motion.  Neurological: He is alert and oriented to person, place, and time. No cranial nerve deficit.  Skin: Skin is warm and dry. No rash noted.  Psychiatric: Mood and affect normal.   LABORATORY PANEL:   CBC  Recent Labs Lab 08/29/15 0330  WBC 8.5  HGB 12.0*  HCT 35.9*  PLT 113*   ------------------------------------------------------------------------------------------------------------------ Chemistries   Recent Labs Lab 08/27/15 2317  08/28/15 1154  08/29/15 0330  NA 137  < >  --   --  133*  K 2.6*  < >  --   < > 3.5  CL 99*  < >  --   --  97*  CO2 30  < >  --   --  29  GLUCOSE 122*  < >  --   --  194*  BUN 23*  < >  --   --  39*  CREATININE 1.05  < >  --   --  1.34*  CALCIUM 8.4*  < >  --   --  8.3*  MG  --   < > 1.7  --   --   AST 24  --   --   --   --   ALT 18  --   --   --   --   ALKPHOS 48  --   --   --   --   BILITOT 0.7  --   --   --   --   < > =  values in this interval not displayed. RADIOLOGY:  No results found. ASSESSMENT AND PLAN:  59 year old male with known chronic systolic dysfunction congestive heart failure coronary artery disease diabetes with complication essential hypertension , admitted after AICD firing  * Persistent Hypotension: Blood pressure in mid 90s now.  Did not require any pressors.  No signs of infection  * Severe hypokalemia: Repleted and resolved  * Chest pain - Appreciate cardiology input. AICD interrogation per Cardio - likely outpatient  * Elevated troponin - due to supply demand ischemia in the setting of chronic systolic heart failure, or potentially AICDfiring. Monitor him on Tele.  * AICD discharge - d/w cardiology for AICD interrogation - likely outpatient  * Type  2 diabetes mellitus ([place]) - continue Insulin sliding scale and metformin  * Chronic systolic heart failure (HCC) - Hold lasix, Aldactone, Metoprolol, Lotensin due to Hypotension  * COPD (chronic obstructive pulmonary disease) ([place]) - continue azithromycin, DuoNeb's when necessary.  * Depression - continue LEXAPRO   Can transfer him back to telemetry.  Will ambulate and make sure  He is not symptomatic.  All the records are reviewed and case discussed with Care Management/Social Worker. Management plans discussed with the patient and he is in agreement.  CODE STATUS: Full Code  TOTAL TIME TAKING CARE OF THIS PATIENT: 35 minutes.   More than 50% of the time was spent in counseling/coordination of care: YES  POSSIBLE D/C IN AM, DEPENDING ON CLINICAL CONDITION.   [facility], Chancellor Vanderloop M.D on 08/29/2015 at 12:50 PM  Between 7am to 6pm - Pager - [phone]  After 6pm go to [email or web address] - password [email or web address]  [facility]  Office  [phone]  CC: Primary care physician; Placido Sou, MD

## 2015-08-29 NOTE — Progress Notes (Addendum)
Pt ambulated around nurses station x1 with RN. Pt became very dyspneic, though O2 sats maintained at 93% and above. Upon return to room, patient complained of epigastric pain, even while at rest. BP remained stable after ambulation as well. Pt slowly recovering from shortness of breath; Dr. Manuella Ghazi paged and updated. Orders for RUQ ultrasound received. Will continue to monitor.   Per ultrasound, pt will have to be NPO after MN in order to have RUQ US done tomorrow morning.

## 2015-08-29 NOTE — Consult Note (Addendum)
PULMONARY / CRITICAL CARE MEDICINE   Name: Ronald Miller. MRN: [id number] DOB: 04-14-1956    ADMISSION DATE:  08/27/2015 CONSULTATION DATE:  08/28/15  REFERRING MD :  Dr. Max Sane   CHIEF COMPLAINT:   Low blood pressure, cough   HISTORY OF PRESENT ILLNESS    59 y.o. male who presents with chest pain after AICD discharge. Patient states that the AICD discharge woke up from his sleep. Since that time he has had chest discomfort which he describes as chest tightness located central chest area. He does not identify this chest pain is exertional, and denies any other associated symptoms including radiation, nausea/vomiting, dizziness, diaphoresis. Patient states that he has a history of chronic systolic congestive heart failure, but that he recognizes symptoms of heart failure exacerbation and does not feel like that is what is going on at this time. He does have a history of COPD as well, and states that he has had a significant cough for the last 2 months which just does not seem to want to improve. He occasionally coughs up some thick whitish sputum. He states that he does have fits of coughing and when she will almost lose consciousness due to such forceful coughing. He was admitted to the medical floor, but noted to have continued systolic blood pressure in the 80s, which is unusual for him. He will low systolic blood pressure he was is mentate well, given the low systolic blood pressure and a history of heart failure he was transferred to the ICU for closer monitoring. In the ICU he is mentating well, his MAP is in the 70s by cuff pressure, he does have slight lightheadedness. He states that his blood pressure usually runs in the 110s.  SUBJECTIVE: Patient with improving blood pressure overnight, still a little on the low side, but patient states he is feeling better. He still admits to some mild dizziness with ambulation, but overall feeling better. Started on DuoNeb's and doxycycline  yesterday, and states this has improved his respiratory status  SIGNIFICANT EVENTS     PAST MEDICAL HISTORY    :  Past Medical History  Diagnosis Date  . ASCVD (arteriosclerotic cardiovascular disease)   . Diabetes mellitus   . Hypertension   . History of cocaine abuse   . Tobacco abuse   . Depression   . Suicidal ideation   . Chronic systolic CHF (congestive heart failure) ([place])   . COPD (chronic obstructive pulmonary disease) ([place])   . Coronary artery disease    Past Surgical History  Procedure Laterality Date  . Coronary artery bypass graft  2001  . Lumbar disc surgery  02/1999    Discectomy and fusion  . Vasectomy      Subsequent reversal  . Insert / replace / remove pacemaker    . Cardiac defibrillator placement     Prior to Admission medications   Medication Sig Start Date End Date Taking? Authorizing Provider  albuterol (PROVENTIL HFA;VENTOLIN HFA) 108 (90 BASE) MCG/ACT inhaler Inhale 2 puffs into the lungs every 6 (six) hours as needed for wheezing or shortness of breath.   Yes Historical Provider, MD  aspirin 325 MG tablet Take 325 mg by mouth daily.     Yes Historical Provider, MD  atorvastatin (LIPITOR) 20 MG tablet Take 20 mg by mouth every evening.   Yes Historical Provider, MD  baclofen (LIORESAL) 10 MG tablet Take 10 mg by mouth 3 (three) times daily.   Yes Historical Provider, MD  benazepril (  LOTENSIN) 5 MG tablet Take 5 mg by mouth daily.   Yes Historical Provider, MD  escitalopram (LEXAPRO) 20 MG tablet Take 20 mg by mouth daily.   Yes Historical Provider, MD  furosemide (LASIX) 40 MG tablet Take 40 mg by mouth 2 (two) times daily. Take an additional 40mg   In AM if weight over 4 lbs   Yes Historical Provider, MD  insulin aspart (NOVOLOG) 100 UNIT/ML injection Inject 10 Units into the skin 3 (three) times daily before meals.   Yes Historical Provider, MD  insulin glargine (LANTUS) 100 UNIT/ML injection Inject 50 Units into the skin at bedtime.   Yes  Historical Provider, MD  metFORMIN (GLUCOPHAGE) 1000 MG tablet Take 1,000 mg by mouth 2 (two) times daily.   Yes Historical Provider, MD  metolazone (ZAROXOLYN) 5 MG tablet Take 5 mg by mouth daily as needed (for 4-5 pound weight gain).   Yes Historical Provider, MD  metoprolol succinate (TOPROL-XL) 100 MG 24 hr tablet Take 100 mg by mouth daily. Take with or immediately following a meal.   Yes Historical Provider, MD  potassium chloride SA (K-DUR,KLOR-CON) 20 MEQ tablet Take 20 mEq by mouth daily.   Yes Historical Provider, MD  spironolactone (ALDACTONE) 50 MG tablet Take 50 mg by mouth daily.   Yes Historical Provider, MD   Allergies  Allergen Reactions  . Codeine Nausea And Vomiting     FAMILY HISTORY   Family History  Problem Relation Age of Onset  . CAD    . Diabetes        SOCIAL HISTORY    reports that he has been smoking Cigarettes.  He has a 34.5 pack-year smoking history. He has never used smokeless tobacco. He reports that he does not drink alcohol or use illicit drugs.  Review of Systems  Constitutional: Negative for fever, chills and weight loss.  HENT: Positive for sore throat. Negative for congestion.   Eyes: Negative for blurred vision, double vision and photophobia.  Respiratory: Positive for cough and shortness of breath. Negative for hemoptysis, sputum production and wheezing.   Gastrointestinal: Negative for heartburn, nausea, vomiting and abdominal pain.  Skin: Negative for itching and rash.  Neurological: Negative for dizziness and tingling.      VITAL SIGNS    Temp:  [97.4 F (36.3 C)-98.1 F (36.7 C)] 98.1 F (36.7 C) (11/25 0200) Pulse Rate:  [68-83] 78 (11/25 0700) Resp:  [15-26] 22 (11/25 0700) BP: (73-125)/(57-95) 88/71 mmHg (11/25 0700) SpO2:  [88 %-99 %] 94 % (11/25 0700) HEMODYNAMICS:   VENTILATOR SETTINGS:   INTAKE / OUTPUT:  Intake/Output Summary (Last 24 hours) at 08/29/15 0840 Last data filed at 08/28/15 2200  Gross per 24  hour  Intake   2033 ml  Output    201 ml  Net   1832 ml       PHYSICAL EXAM   Physical Exam  Constitutional: He is oriented to person, place, and time. He appears well-developed and well-nourished.  HENT:  Head: Normocephalic and atraumatic.  Right Ear: External ear normal.  Left Ear: External ear normal.  Mouth/Throat: Oropharynx is clear and moist.  Eyes: Conjunctivae and EOM are normal. Pupils are equal, round, and reactive to light.  Neck: Normal range of motion. Neck supple.  Pulmonary/Chest: Effort normal and breath sounds normal.  Faint upper lobe wheezes - improved  Abdominal: Soft. Bowel sounds are normal.  Musculoskeletal:  Trace pedal edema  Neurological: He is alert and oriented to person, place, and time.  Skin: Skin is warm and dry.  Psychiatric: He has a normal mood and affect.  Nursing note and vitals reviewed.      LABS   LABS:  CBC  Recent Labs Lab 08/28/15 0307 08/28/15 0920 08/29/15 0330  WBC 8.1 6.5 8.5  HGB 12.1* 12.2* 12.0*  HCT 36.2* 36.7* 35.9*  PLT 118* 119* 113*   Coag's No results for input(s): APTT, INR in the last 168 hours. BMET  Recent Labs Lab 08/27/15 2317 08/28/15 0307 08/28/15 0920 08/28/15 1906 08/29/15 0330  NA 137  --  136  --  133*  K 2.6*  --  2.9* 3.6 3.5  CL 99*  --  95*  --  97*  CO2 30  --  31  --  29  BUN 23*  --  30*  --  39*  CREATININE 1.05 1.28* 1.14  --  1.34*  GLUCOSE 122*  --  244*  --  194*   Electrolytes  Recent Labs Lab 08/27/15 2317 08/28/15 0307 08/28/15 0920 08/28/15 1154 08/29/15 0330  CALCIUM 8.4*  --  8.6*  --  8.3*  MG  --  1.7  --  1.7  --    Sepsis Markers No results for input(s): LATICACIDVEN, PROCALCITON, O2SATVEN in the last 168 hours. ABG No results for input(s): PHART, PCO2ART, PO2ART in the last 168 hours. Liver Enzymes  Recent Labs Lab 08/27/15 2317  AST 24  ALT 18  ALKPHOS 48  BILITOT 0.7  ALBUMIN 3.8   Cardiac Enzymes  Recent Labs Lab  08/28/15 0307 08/28/15 0920 08/28/15 1154  TROPONINI 0.07* 0.06* 0.05*   Glucose  Recent Labs Lab 08/28/15 0615 08/28/15 0811 08/28/15 1135 08/28/15 1623 08/28/15 2142 08/29/15 0804  GLUCAP 120* 115* 227* 163* 234* 225*     Recent Results (from the past 240 hour(s))  MRSA PCR Screening     Status: Abnormal   Collection Time: 08/28/15 10:41 AM  Result Value Ref Range Status   MRSA by PCR POSITIVE (A) NEGATIVE Final    Comment:        The GeneXpert MRSA Assay (FDA approved for NASAL specimens only), is one component of a comprehensive MRSA colonization surveillance program. It is not intended to diagnose MRSA infection nor to guide or monitor treatment for MRSA infections. CRITICAL RESULT CALLED TO, READ BACK BY AND VERIFIED WITH: Aldona Bar ROUSE AT 1229 08/28/15 DAS   Culture, blood (routine x 2)     Status: None (Preliminary result)   Collection Time: 08/28/15 11:55 AM  Result Value Ref Range Status   Specimen Description BLOOD RIGHT AC  Final   Special Requests   Final    BOTTLES DRAWN AEROBIC AND ANAEROBIC  AER 2CC ANA 3CC   Culture NO GROWTH < 24 HOURS  Final   Report Status PENDING  Incomplete  Culture, blood (routine x 2)     Status: None (Preliminary result)   Collection Time: 08/28/15 12:07 PM  Result Value Ref Range Status   Specimen Description BLOOD LEFT AC  Final   Special Requests   Final    BOTTLES DRAWN AEROBIC AND ANAEROBIC  AER 1CC ANA 3CC   Culture NO GROWTH < 24 HOURS  Final   Report Status PENDING  Incomplete     Current facility-administered medications:  .  acetaminophen (TYLENOL) tablet 650 mg, 650 mg, Oral, Q6H PRN **OR** acetaminophen (TYLENOL) suppository 650 mg, 650 mg, Rectal, Q6H PRN, Lance Coon, MD .  ALPRAZolam Duanne Moron) tablet 0.25 mg, 0.25  mg, Oral, TID PRN, Laverle Hobby, MD, 0.25 mg at 08/28/15 1619 .  aspirin tablet 325 mg, 325 mg, Oral, Daily, Lance Coon, MD, 325 mg at 08/28/15 0851 .  atorvastatin (LIPITOR)  tablet 20 mg, 20 mg, Oral, QPM, Lance Coon, MD, 20 mg at 08/28/15 2132 .  Chlorhexidine Gluconate Cloth 2 % PADS 6 each, 6 each, Topical, Q0600, Max Sane, MD .  doxycycline (VIBRA-TABS) tablet 100 mg, 100 mg, Oral, Q12H, Marita Burnsed, MD, 100 mg at 08/28/15 2200 .  enoxaparin (LOVENOX) injection 40 mg, 40 mg, Subcutaneous, QHS, Lance Coon, MD, 40 mg at 08/28/15 2200 .  escitalopram (LEXAPRO) tablet 20 mg, 20 mg, Oral, Daily, Lance Coon, MD, 20 mg at 08/28/15 0851 .  insulin aspart (novoLOG) injection 0-5 Units, 0-5 Units, Subcutaneous, QHS, Sacred Roa, MD, 2 Units at 08/28/15 2251 .  insulin aspart (novoLOG) injection 0-9 Units, 0-9 Units, Subcutaneous, TID WC, Vilinda Boehringer, MD, 3 Units at 08/29/15 0824 .  ipratropium-albuterol (DUONEB) 0.5-2.5 (3) MG/3ML nebulizer solution 3 mL, 3 mL, Nebulization, Q4H PRN, Lance Coon, MD, 3 mL at 08/28/15 0311 .  ipratropium-albuterol (DUONEB) 0.5-2.5 (3) MG/3ML nebulizer solution 3 mL, 3 mL, Nebulization, Q6H, Sharna Gabrys, MD, 3 mL at 08/29/15 0756 .  metFORMIN (GLUCOPHAGE) tablet 1,000 mg, 1,000 mg, Oral, BID WC, Lance Coon, MD, 1,000 mg at 08/29/15 [id number] .  morphine 2 MG/ML injection 2 mg, 2 mg, Intravenous, Q4H PRN, Lance Coon, MD .  mupirocin ointment (BACTROBAN) 2 % 1 application, 1 application, Nasal, BID, Max Sane, MD, 1 application at [id number] 2200 .  ondansetron (ZOFRAN) tablet 4 mg, 4 mg, Oral, Q6H PRN **OR** ondansetron (ZOFRAN) injection 4 mg, 4 mg, Intravenous, Q6H PRN, Lance Coon, MD .  sodium chloride 0.9 % injection 3 mL, 3 mL, Intravenous, Q12H, Lance Coon, MD, 3 mL at 08/28/15 2200 .  zolpidem (AMBIEN) tablet 10 mg, 10 mg, Oral, QHS PRN, Laverle Hobby, MD, 10 mg at 08/28/15 2135  IMAGING    Dg Chest Port 1 View  08/28/2015  CLINICAL DATA:  Shortness of breath and chest pain, defibrillator discharged last night. EXAM: PORTABLE CHEST 1 VIEW COMPARISON:  Chest x-rays dated 08/27/2015 and 04/09/2014. FINDINGS:  Cardiomegaly is unchanged. Left chest wall pacemaker/AICD appear stable in position. No evidence of wire discontinuity There is no evidence of acute infiltrate on today's exam. Subtle opacity at the left lung base appears similar to previous chest x-rays from 2015 indicating chronic scarring or atelectasis. Lungs are otherwise clear. No evidence of active/acute congestive heart failure. No confluent opacity to suggest a developing pneumonia. No pleural effusion seen. IMPRESSION: No evidence of acute cardiopulmonary abnormality. No evidence of acute/active congestive heart failure or pneumonia on today's exam. Cardiomegaly is stable. Electronically Signed   By: Franki Cabot M.D.   On: 08/28/2015 11:27      Indwelling Urinary Catheter continued, requirement due to   Reason to continue Indwelling Urinary Catheter for strict Intake/Output monitoring for hemodynamic instability   Central Line continued, requirement due to   Reason to continue [employer] Monitoring of central venous pressure or other hemodynamic parameters   Ventilator continued, requirement due to, resp failure    Ventilator Sedation RASS 0 to -2   Cultures: BCx2  UC  Sputum  Antibiotics: Doxycycline 11/24>>  Lines:   ASSESSMENT/PLAN  60 year old male with chronic systolic heart failure, coronary artery disease, essential hypertension, status post defibrillator, transferred to the ICU for hypotension.  PULMONARY Mild shortness of breath, cough -Chest x-ray with  no infiltrates or vascular congestion, no signs of fluid overload or pulmonary edema -Remote history of COPD -Only on as needed albuterol at home -I believe that his current dyspnea, which is mild, is related more to cardiac/congestive heart failure than primary pulmonary issues -Continue with observation, shortness of breath increases may consider adding Pulmicort nebulizers -He does have some faint upper lobe wheezes, will schedule duo nebs for one day  and then continue as needed -Continue with doxycycline 1 tab by mouth twice a day 10 days, start Dulera 200 - patient with know COPD, not on any inhalers.   CARDIOVASCULAR Systolic heart failure status post defibrillator Coronary artery disease Essential Hypertension Hypotension -Monitor blood pressure closely, improving today -Systolic blood pressure is currently soft, not sure he'll be able to tolerate Lasix or beta blockers.  Cardiology following -Unknown reason for AICD discharge, his troponins are mildly elevated, we'll trend out -Given stable MAP, and mentating well, no need for pressors at this time, and can be transferred to a monitored bed on the medical floor   RENAL -Monitor I's and O's and urine output -Monitor BMP  GASTROINTESTINAL -PPI    Pulmonary consult time devoted to patient care services described in this note is 45 minutes.    Vilinda Boehringer, MD [facility] Pager [phone] (please enter 7-digits) On Call Pager [phone] (please enter 7-digits)     08/29/2015, 8:40 AM  Note: This note was prepared with Dragon dictation along with smaller phrase technology. Any transcriptional errors that result from this process are unintentional.

## 2015-08-29 NOTE — Progress Notes (Signed)
Pt. admitted to 2A from ICU, [place]. Report received from [facility]. Oriented to room, call bell, Ascom phones and staff. Bed in low position. Fall safety plan reviewed, blue non-skid socks in place, bed alarm on. Full assessment to Epic. Will continue to monitor.

## 2015-08-29 NOTE — Progress Notes (Signed)
Pt complaint of incessant cough. On call paged and RN received orders for robitussin PRN.

## 2015-08-29 NOTE — Progress Notes (Signed)
[facility] Encounter Note  Patient: Darrall Mata. / Admit Date: 08/27/2015 / Date of Encounter: 08/29/2015, 8:15 AM   Subjective: Some weakness but no evidence of chest pain or congestive heart failure symptoms and no rhythm disturbances by telemetry  Review of Systems: Positive for: Weakness Negative for: Vision change, hearing change, syncope, dizziness, nausea, vomiting,diarrhea, bloody stool, stomach pain, cough, congestion, diaphoresis, urinary frequency, urinary pain,skin lesions, skin rashes Others previously listed  Objective: Telemetry: Paced rhythm Physical Exam: Blood pressure 88/71, pulse 78, temperature 98.1 F (36.7 C), temperature source Oral, resp. rate 22, height 5\' 9"  (1.753 m), weight 242 lb 1 oz (109.799 kg), SpO2 94 %. Body mass index is 35.73 kg/(m^2). General: Well developed, well nourished, in no acute distress. Head: Normocephalic, atraumatic, sclera non-icteric, no xanthomas, nares are without discharge. Neck: No apparent masses Lungs: Normal respirations with few   wheezes, no rhonchi, no rales , no crackles   Heart: Regular rate and rhythm, normal S1 S2, no murmur, no rub, no gallop, PMI is normal size and placement, carotid upstroke normal without bruit, jugular venous pressure normal Abdomen: Soft, non-tender, distended with normoactive bowel sounds. No hepatosplenomegaly. Abdominal aorta is normal size without bruit Extremities: No edema, no clubbing, no cyanosis, no ulcers,  Peripheral: 2+ radial, 2+ femoral, 0+ dorsal pedal pulses Neuro: Alert and oriented. Moves all extremities spontaneously. Psych:  Responds to questions appropriately with a normal affect.   Intake/Output Summary (Last 24 hours) at 08/29/15 0815 Last data filed at 08/28/15 2200  Gross per 24 hour  Intake   2273 ml  Output    201 ml  Net   2072 ml    Inpatient Medications:  . aspirin  325 mg Oral Daily  . atorvastatin  20 mg Oral QPM  .  Chlorhexidine Gluconate Cloth  6 each Topical Q0600  . doxycycline  100 mg Oral Q12H  . enoxaparin (LOVENOX) injection  40 mg Subcutaneous QHS  . escitalopram  20 mg Oral Daily  . insulin aspart  0-5 Units Subcutaneous QHS  . insulin aspart  0-9 Units Subcutaneous TID WC  . ipratropium-albuterol  3 mL Nebulization Q6H  . metFORMIN  1,000 mg Oral BID WC  . mupirocin ointment  1 application Nasal BID  . sodium chloride  3 mL Intravenous Q12H   Infusions:    Labs:  Recent Labs  08/28/15 0307 08/28/15 0920 08/28/15 1154 08/28/15 1906 08/29/15 0330  NA  --  136  --   --  133*  K  --  2.9*  --  3.6 3.5  CL  --  95*  --   --  97*  CO2  --  31  --   --  29  GLUCOSE  --  244*  --   --  194*  BUN  --  30*  --   --  39*  CREATININE 1.28* 1.14  --   --  1.34*  CALCIUM  --  8.6*  --   --  8.3*  MG 1.7  --  1.7  --   --     Recent Labs  08/27/15 2317  AST 24  ALT 18  ALKPHOS 48  BILITOT 0.7  PROT 6.5  ALBUMIN 3.8    Recent Labs  08/27/15 2317  08/28/15 0920 08/29/15 0330  WBC 10.1  < > 6.5 8.5  NEUTROABS 7.0*  --   --   --   HGB 12.9*  < > 12.2* 12.0*  HCT 38.8*  < > 36.7* 35.9*  MCV 91.5  < > 91.9 92.0  PLT 135*  < > 119* 113*  < > = values in this interval not displayed.  Recent Labs  08/27/15 2317 08/28/15 0307 08/28/15 0920 08/28/15 1154 08/28/15 1906 08/29/15 0109  CKMB  --   --   --  2.6 2.7 2.7  TROPONINI 0.06* 0.07* 0.06* 0.05*  --   --    Invalid input(s): POCBNP  Recent Labs  08/28/15 0307  HGBA1C 7.1*     Weights: Filed Weights   08/27/15 2313  Weight: 242 lb 1 oz (109.799 kg)     Radiology/Studies:  Dg Chest Port 1 View  08/28/2015  CLINICAL DATA:  Shortness of breath and chest pain, defibrillator discharged last night. EXAM: PORTABLE CHEST 1 VIEW COMPARISON:  Chest x-rays dated 08/27/2015 and 04/09/2014. FINDINGS: Cardiomegaly is unchanged. Left chest wall pacemaker/AICD appear stable in position. No evidence of wire discontinuity  There is no evidence of acute infiltrate on today's exam. Subtle opacity at the left lung base appears similar to previous chest x-rays from 2015 indicating chronic scarring or atelectasis. Lungs are otherwise clear. No evidence of active/acute congestive heart failure. No confluent opacity to suggest a developing pneumonia. No pleural effusion seen. IMPRESSION: No evidence of acute cardiopulmonary abnormality. No evidence of acute/active congestive heart failure or pneumonia on today's exam. Cardiomegaly is stable. Electronically Signed   By: Franki Cabot M.D.   On: 08/28/2015 11:27   Dg Chest Port 1 View  08/27/2015  CLINICAL DATA:  Initial evaluation for acute cough, chest pain. EXAM: PORTABLE CHEST 1 VIEW COMPARISON:  Prior study from 04/09/2014. FINDINGS: Median sternotomy wires with underlying CABG markers and surgical clips noted. Dehiscence of a few of the sternotomy wires noted, stable. Left-sided transvenous pacemaker/ AICD is item changed. Cardiomegaly is unchanged. Mediastinal silhouette within normal limits. Lungs are normally inflated. Mild vascular congestion without overt pulmonary edema. No pleural effusion. Mild scattered left basilar opacity, favored to reflect atelectasis, possible developing infiltrate could be considered in the correct clinical setting. No pneumothorax. No acute osseus abnormality. IMPRESSION: 1. Stable cardiomegaly with mild pulmonary vascular congestion without overt pulmonary edema. 2. Mild left basilar opacity, favored to reflect atelectasis, although possible early/developing infiltrate could be considered in the correct clinical setting. Electronically Signed   By: Jeannine Boga M.D.   On: 08/27/2015 23:38     Assessment and Recommendation  59 y.o. male with known severe chronic systolic dysfunction congestive heart failure with weakness and fatigue and currently no evidence of exacerbation of heart failure but possible hypotension secondary to medical  regimen as well as possible overdiuresis based on patient's history and discussion. After discontinuation of diuretic and use of intravenous fluids and abstinence of beta blocker the patient slightly feels better. There is no evidence of myocardial infarction at this time or recurrence of defibrillation 1. Abstain from Lasix at this time due to no current evidence of congestive heart failure pulmonary edema or hypoxia which may have been causing some of his symptoms of weakness 2. Abstain from metoprolol due to concerns of side effects of medication, causing weakness and fatigue 3. Begin ambulation and follow for improvements of symptoms today with possible discharged home with follow-up outpatient for further adjustments of medication management 4. Defibrillator interrogation either in or outpatient depending on availability and further adjustments as necessary 5. Okay for discharge to home if patient ambulating fairly well with no further significant symptoms  Signed, Darnell Level  Nehemiah Massed M.D. FACC

## 2015-08-29 NOTE — Progress Notes (Signed)
PHARMACY - CRITICAL CARE PROGRESS NOTE  Pharmacy Consult for Electrolyte Monitoring  Patient Measurements: Height: 5\' 9"  (175.3 cm) Weight: 242 lb 1 oz (109.799 kg) IBW/kg (Calculated) : 70.7  Vital Signs: Temp: 97.5 F (36.4 C) (11/24 2010) Temp Source: Oral (11/24 2010) BP: 101/59 mmHg (11/25 0400) Pulse Rate: 79 (11/25 0400)    Labs:  Recent Labs  08/27/15 2317 08/28/15 0307 08/28/15 0920 08/28/15 1154 08/29/15 0330  WBC 10.1 8.1 6.5  --  8.5  HGB 12.9* 12.1* 12.2*  --  12.0*  HCT 38.8* 36.2* 36.7*  --  35.9*  PLT 135* 118* 119*  --  113*  CREATININE 1.05 1.28* 1.14  --  1.34*  MG  --  1.7  --  1.7  --   ALBUMIN 3.8  --   --   --   --   PROT 6.5  --   --   --   --   AST 24  --   --   --   --   ALT 18  --   --   --   --   ALKPHOS 48  --   --   --   --   BILITOT 0.7  --   --   --   --    Estimated Creatinine Clearance: 73.3 mL/min (by C-G formula based on Cr of 1.34).  Lab Results  Component Value Date   NA 133* 08/29/2015   K 3.5 08/29/2015   CL 97* 08/29/2015   CO2 29 08/29/2015     Recent Labs  08/28/15 1135 08/28/15 1623 08/28/15 2142  GLUCAP 227* 163* 234*    Assessment: Pharmacy consulted for electrolyte monitoring and managing.  K+ today 2.9 and Mg low end of normal at 1.7   Plan:  Potassium 3.5 this morning, calcium 8.3, magnesium and phosphorus not ordered. No supplementation needed at this point, will recheck with AM labs (all electrolytes) and dose as needed per consult.   Laural Benes, Pharm.D., BCPS Clinical Pharmacist 08/29/2015,5:44 AM

## 2015-08-29 NOTE — Progress Notes (Signed)
Pt now complaining of chest tightness. Dr. Manuella Ghazi paged and updated. Received orders for CT angio chest r/o PE and CT abd/pelvis and 12-lead EKG. Respiratory notified and on their way.

## 2015-08-30 ENCOUNTER — Encounter
Admission: EM | Disposition: A | Discharge status: Home / Self Care | Payer: Self-pay | Source: Home / Self Care | Attending: Internal Medicine

## 2015-08-30 ENCOUNTER — Inpatient Hospital Stay: Payer: Medicare HMO

## 2015-08-30 ENCOUNTER — Inpatient Hospital Stay: Payer: Medicare HMO | Admitting: Certified Registered Nurse Anesthetist

## 2015-08-30 ENCOUNTER — Encounter: Payer: Self-pay | Admitting: Anesthesiology

## 2015-08-30 DIAGNOSIS — K8000 Calculus of gallbladder with acute cholecystitis without obstruction: Principal | ICD-10-CM

## 2015-08-30 DIAGNOSIS — I10 Essential (primary) hypertension: Secondary | ICD-10-CM

## 2015-08-30 DIAGNOSIS — Z9049 Acquired absence of other specified parts of digestive tract: Secondary | ICD-10-CM | POA: Insufficient documentation

## 2015-08-30 HISTORY — PX: CHOLECYSTECTOMY: SHX55

## 2015-08-30 LAB — GLUCOSE, CAPILLARY
GLUCOSE-CAPILLARY: 152 mg/dL — AB (ref 65–99)
GLUCOSE-CAPILLARY: 151 mg/dL — AB (ref 65–99)
Glucose-Capillary: 204 mg/dL — ABNORMAL HIGH (ref 65–99)
Glucose-Capillary: 403 mg/dL — ABNORMAL HIGH (ref 65–99)

## 2015-08-30 LAB — BASIC METABOLIC PANEL
ANION GAP: 6 (ref 5–15)
BUN: 29 mg/dL — ABNORMAL HIGH (ref 6–20)
CHLORIDE: 99 mmol/L — AB (ref 101–111)
CO2: 31 mmol/L (ref 22–32)
CREATININE: 0.95 mg/dL (ref 0.61–1.24)
Calcium: 9 mg/dL (ref 8.9–10.3)
GFR calc non Af Amer: >60 mL/min (ref >60)
Glucose, Bld: 162 mg/dL — ABNORMAL HIGH (ref 65–99)
POTASSIUM: 3.6 mmol/L (ref 3.5–5.1)
SODIUM: 136 mmol/L (ref 135–145)

## 2015-08-30 LAB — PHOSPHORUS: PHOSPHORUS: 3.2 mg/dL (ref 2.5–4.6)

## 2015-08-30 LAB — MAGNESIUM: MAGNESIUM: 1.7 mg/dL (ref 1.7–2.4)

## 2015-08-30 SURGERY — LAPAROSCOPIC CHOLECYSTECTOMY
Anesthesia: General | Wound class: Clean Contaminated

## 2015-08-30 MED ORDER — ROCURONIUM BROMIDE 100 MG/10ML IV SOLN
INTRAVENOUS | Status: DC | PRN
  Administered 2015-08-30: 10 mg via INTRAVENOUS
  Administered 2015-08-30: 30 mg via INTRAVENOUS

## 2015-08-30 MED ORDER — SUCCINYLCHOLINE CHLORIDE 20 MG/ML IJ SOLN
INTRAMUSCULAR | Status: DC | PRN
  Administered 2015-08-30: 100 mg via INTRAVENOUS

## 2015-08-30 MED ORDER — PROPOFOL 10 MG/ML IV BOLUS
INTRAVENOUS | Status: DC | PRN
  Administered 2015-08-30: 200 mg via INTRAVENOUS

## 2015-08-30 MED ORDER — LACTATED RINGERS IV SOLN
INTRAVENOUS | Status: DC | PRN
  Administered 2015-08-30: 12:00:00 via INTRAVENOUS

## 2015-08-30 MED ORDER — MORPHINE SULFATE (PF) 2 MG/ML IV SOLN
1.0000 mg | INTRAVENOUS | Status: DC | PRN
Start: 2015-08-30 — End: 2015-09-01
  Administered 2015-08-30: 1 mg via INTRAVENOUS
  Filled 2015-08-30: qty 1

## 2015-08-30 MED ORDER — OXYCODONE-ACETAMINOPHEN 7.5-325 MG PO TABS
1.0000 | ORAL_TABLET | ORAL | Status: DC | PRN
  Administered 2015-08-30: 2 via ORAL
  Administered 2015-08-30 – 2015-08-31 (×2): 1 via ORAL
  Administered 2015-08-31 – 2015-09-01 (×4): 2 via ORAL
  Filled 2015-08-30: qty 1
  Filled 2015-08-30 (×6): qty 2

## 2015-08-30 MED ORDER — DEXAMETHASONE SODIUM PHOSPHATE 4 MG/ML IJ SOLN
INTRAMUSCULAR | Status: DC | PRN
  Administered 2015-08-30: 8 mg via INTRAVENOUS

## 2015-08-30 MED ORDER — BUPIVACAINE HCL (PF) 0.5 % IJ SOLN
INTRAMUSCULAR | Status: AC
  Filled 2015-08-30: qty 30

## 2015-08-30 MED ORDER — DEXTROSE 5 % IV SOLN
2.0000 g | Freq: Four times a day (QID) | INTRAVENOUS | Status: DC
  Administered 2015-08-30 – 2015-09-01 (×9): 2 g via INTRAVENOUS
  Filled 2015-08-30 (×13): qty 2

## 2015-08-30 MED ORDER — ONDANSETRON HCL 4 MG/2ML IJ SOLN: 4.0000 mg | Freq: Once | INTRAMUSCULAR | Status: DC | PRN

## 2015-08-30 MED ORDER — FENTANYL CITRATE (PF) 100 MCG/2ML IJ SOLN: 25.0000 ug | INTRAMUSCULAR | Status: DC | PRN

## 2015-08-30 MED ORDER — METFORMIN HCL 500 MG PO TABS
1000.0000 mg | ORAL_TABLET | Freq: Two times a day (BID) | ORAL | Status: DC
  Filled 2015-08-30: qty 2

## 2015-08-30 MED ORDER — INSULIN ASPART 100 UNIT/ML ~~LOC~~ SOLN
0.0000 [IU] | Freq: Three times a day (TID) | SUBCUTANEOUS | Status: DC
  Administered 2015-08-30: 9 [IU] via SUBCUTANEOUS
  Administered 2015-08-31: 5 [IU] via SUBCUTANEOUS
  Administered 2015-08-31: 2 [IU] via SUBCUTANEOUS
  Administered 2015-08-31: 7 [IU] via SUBCUTANEOUS
  Administered 2015-08-31: 2 [IU] via SUBCUTANEOUS
  Administered 2015-09-01: 7 [IU] via SUBCUTANEOUS
  Administered 2015-09-01: 5 [IU] via SUBCUTANEOUS
  Filled 2015-08-30: qty 5
  Filled 2015-08-30: qty 7
  Filled 2015-08-30: qty 5
  Filled 2015-08-30: qty 9
  Filled 2015-08-30: qty 7
  Filled 2015-08-30 (×2): qty 2

## 2015-08-30 MED ORDER — FENTANYL CITRATE (PF) 100 MCG/2ML IJ SOLN
INTRAMUSCULAR | Status: DC | PRN
  Administered 2015-08-30: 100 ug via INTRAVENOUS
  Administered 2015-08-30: 50 ug via INTRAVENOUS

## 2015-08-30 MED ORDER — BUPIVACAINE HCL (PF) 0.5 % IJ SOLN
INTRAMUSCULAR | Status: DC | PRN
  Administered 2015-08-30: 30 mL

## 2015-08-30 MED ORDER — MIDAZOLAM HCL 2 MG/2ML IJ SOLN
INTRAMUSCULAR | Status: DC | PRN
  Administered 2015-08-30: 2 mg via INTRAVENOUS

## 2015-08-30 MED ORDER — CHLORHEXIDINE GLUCONATE CLOTH 2 % EX PADS
6.0000 | MEDICATED_PAD | Freq: Every day | CUTANEOUS | Status: AC
  Administered 2015-08-31 – 2015-09-01 (×2): 6 via TOPICAL

## 2015-08-30 SURGICAL SUPPLY — 35 items
APPLIER CLIP 5 13 M/L LIGAMAX5 (MISCELLANEOUS) ×3
APPLIER CLIP LOGIC TI 5 (MISCELLANEOUS) IMPLANT
CANISTER SUCT 1200ML W/VALVE (MISCELLANEOUS) ×3 IMPLANT
CATH CHOLANGI 4FR 420404F (CATHETERS) IMPLANT
CHLORAPREP W/TINT 26ML (MISCELLANEOUS) ×3 IMPLANT
CLIP APPLIE 5 13 M/L LIGAMAX5 (MISCELLANEOUS) ×1 IMPLANT
CONRAY 60ML FOR OR (MISCELLANEOUS) IMPLANT
DEFOGGER SCOPE WARMER CLEARIFY (MISCELLANEOUS) ×3 IMPLANT
DRSG TEGADERM 2-3/8X2-3/4 SM (GAUZE/BANDAGES/DRESSINGS) ×12 IMPLANT
DRSG TELFA 3X8 NADH (GAUZE/BANDAGES/DRESSINGS) ×3 IMPLANT
ENDOPOUCH RETRIEVER 10 (MISCELLANEOUS) ×3 IMPLANT
GLOVE PI ORTHOPRO 6.5 (GLOVE) ×6
GLOVE PI ORTHOPRO STRL 6.5 (GLOVE) ×3 IMPLANT
GOWN STRL REUS W/ TWL LRG LVL3 (GOWN DISPOSABLE) ×2 IMPLANT
GOWN STRL REUS W/TWL LRG LVL3 (GOWN DISPOSABLE) ×4
IRRIGATION STRYKERFLOW (MISCELLANEOUS) ×1 IMPLANT
IRRIGATOR STRYKERFLOW (MISCELLANEOUS) ×3
IV CATH ANGIO 12GX3 LT BLUE (NEEDLE) IMPLANT
IV NS 1000ML (IV SOLUTION) ×2
IV NS 1000ML BAXH (IV SOLUTION) ×1 IMPLANT
LABEL OR SOLS (LABEL) ×3 IMPLANT
LIQUID BAND (GAUZE/BANDAGES/DRESSINGS) ×3 IMPLANT
NEEDLE HYPO 25X1 1.5 SAFETY (NEEDLE) ×3 IMPLANT
NS IRRIG 500ML POUR BTL (IV SOLUTION) ×3 IMPLANT
PACK LAP CHOLECYSTECTOMY (MISCELLANEOUS) ×3 IMPLANT
PAD GROUND ADULT SPLIT (MISCELLANEOUS) ×3 IMPLANT
SCISSORS METZENBAUM CVD 33 (INSTRUMENTS) ×3 IMPLANT
SLEEVE ENDOPATH XCEL 5M (ENDOMECHANICALS) ×6 IMPLANT
SUT MNCRL 4-0 (SUTURE) ×2
SUT MNCRL 4-0 27XMFL (SUTURE) ×1
SUT VICRYL 0 AB UR-6 (SUTURE) ×6 IMPLANT
SUTURE MNCRL 4-0 27XMF (SUTURE) ×1 IMPLANT
TROCAR XCEL BLUNT TIP 100MML (ENDOMECHANICALS) ×3 IMPLANT
TROCAR XCEL NON-BLD 5MMX100MML (ENDOMECHANICALS) ×3 IMPLANT
TUBING INSUFFLATOR HI FLOW (MISCELLANEOUS) ×3 IMPLANT

## 2015-08-30 NOTE — Progress Notes (Signed)
Pt bedtime CBG above 400/ MD notified. Acknowledged. New orders placed. Approved to give pt 9 units. Will continue to monitor.

## 2015-08-30 NOTE — Progress Notes (Signed)
[facility] NAME: Ronald Miller    MR#:  [id number]  DATE OF BIRTH:  12-17-1955  SUBJECTIVE:  CHIEF COMPLAINT:   Chief Complaint  Patient presents with  . Pacemaker Problem  . Cough  still having RUQ pain. blood pressure in 100s  REVIEW OF SYSTEMS:  Review of Systems  Constitutional: Negative for fever, weight loss, malaise/fatigue and diaphoresis.  HENT: Negative for ear discharge, ear pain, hearing loss, nosebleeds, sore throat and tinnitus.   Eyes: Negative for blurred vision and pain.  Respiratory: Negative for cough, hemoptysis, shortness of breath and wheezing.   Cardiovascular: Negative for chest pain, palpitations, orthopnea and leg swelling.  Gastrointestinal: Positive for nausea and abdominal pain. Negative for heartburn, vomiting, diarrhea, constipation and blood in stool.  Genitourinary: Negative for dysuria, urgency and frequency.  Musculoskeletal: Negative for myalgias and back pain.  Skin: Negative for itching and rash.  Neurological: Negative for dizziness, tingling, tremors, focal weakness, seizures, weakness and headaches.  Psychiatric/Behavioral: Negative for depression. The patient is not nervous/anxious.    DRUG ALLERGIES:   Allergies  Allergen Reactions  . Codeine Nausea And Vomiting   VITALS:  Blood pressure 106/72, pulse 90, temperature 98 F (36.7 C), temperature source Oral, resp. rate 16, height 5\' 9"  (1.753 m), weight 107.049 kg (236 lb), SpO2 91 %. PHYSICAL EXAMINATION:  Physical Exam  Constitutional: He is oriented to person, place, and time and well-developed, well-nourished, and in no distress.  HENT:  Head: Normocephalic and atraumatic.  Eyes: Conjunctivae and EOM are normal. Pupils are equal, round, and reactive to light.  Neck: Normal range of motion. Neck supple. No tracheal deviation present. No thyromegaly present.  Cardiovascular: Normal rate, regular rhythm and normal heart  sounds.   Pulmonary/Chest: Effort normal and breath sounds normal. No respiratory distress. He has no wheezes. He exhibits no tenderness.  Abdominal: Soft. Bowel sounds are normal. He exhibits no distension. There is tenderness in the right upper quadrant.  Musculoskeletal: Normal range of motion.  Neurological: He is alert and oriented to person, place, and time. No cranial nerve deficit.  Skin: Skin is warm and dry. No rash noted.  Psychiatric: Mood and affect normal.   LABORATORY PANEL:   CBC  Recent Labs Lab 08/29/15 0330  WBC 8.5  HGB 12.0*  HCT 35.9*  PLT 113*   ------------------------------------------------------------------------------------------------------------------ Chemistries   Recent Labs Lab 08/27/15 2317  08/30/15 0510  NA 137  < > 136  K 2.6*  < > 3.6  CL 99*  < > 99*  CO2 30  < > 31  GLUCOSE 122*  < > 162*  BUN 23*  < > 29*  CREATININE 1.05  < > 0.95  CALCIUM 8.4*  < > 9.0  MG  --   < > 1.7  AST 24  --   --   ALT 18  --   --   ALKPHOS 48  --   --   BILITOT 0.7  --   --   < > = values in this interval not displayed. RADIOLOGY:  Ct Angio Chest Pe W/cm &/or Wo Cm  08/29/2015  CLINICAL DATA:  Severe dyspnea on exertion, epigastric pain, chest EXAM: CT ANGIOGRAPHY CHEST CT ABDOMEN AND PELVIS WITH CONTRAST TECHNIQUE: Multidetector CT imaging of the chest was performed using the standard protocol during bolus administration of intravenous contrast. Multiplanar CT image reconstructions and MIPs were obtained to evaluate the vascular anatomy. Multidetector CT imaging  of the abdomen and pelvis was performed using the standard protocol during bolus administration of intravenous contrast. CONTRAST:  122mL OMNIPAQUE IOHEXOL 350 MG/ML SOLN COMPARISON:  08/28/2015 chest radiograph FINDINGS: CTA CHEST FINDINGS No filling defects in the pulmonary arterial system. No evidence of thoracic aortic dilatation. Widespread mediastinal adenopathy including an 18 mm  pretracheal lymph node, 25 mm subcarinal lymph node, and a 15 mm right hilar lymph node. Other enlarged lymph nodes are also present. Thoracic inlet and thyroid are normal. Two lead cardiac pacer noted. No pleural or pericardial effusion. No significant pulmonary parenchymal opacification on the right. Minimal dependent atelectasis right lung base. Minimal dependent atelectasis on the left. Mild discoid atelectasis posterior left apex. Left lung otherwise clear. No acute musculoskeletal abnormalities. Cardiac enlargement. Contrast seen extending into attic pains. Prominence of the cranial aspect of the inferior vena cava. CT ABDOMEN and PELVIS FINDINGS Diffuse hepatic steatosis. 7 mm stone in the neck of the gallbladder. Evidence of gallbladder wall thickening and mild pericholecystic inflammation. Pancreas is normal.  Spleen is normal.  Adrenal glands are normal. 17 mm celiac axis lymph node. Motion artifact causes mild limitation in evaluation of the kidneys, but these appear otherwise normal. Bladder is normal. Reproductive organs are normal. There is trace free fluid in the pelvis. Nonobstructive bowel gas pattern. No focal bowel abnormalities. Stool throughout the colon. Minimal inflammatory change bilaterally in the perinephric retroperitoneum, quite possibly not of acute origin. No acute musculoskeletal abnormalities. Review of the MIP images confirms the above findings. IMPRESSION: Evidence of diastolic dysfunction with contrast refluxing into hepatic veins. Cardiac enlargement. No evidence of pulmonary embolism. No significant parenchymal opacity to indicate pulmonary edema. Cholelithiasis. Findings suspicious for mild gallbladder wall thickening and possibly mild pericholecystic inflammation. Findings concerning for cholecystitis. If indicated consider limited right upper quadrant abdomen ultrasound. Prominent mediastinal lymph nodes, right hilar lymph node, and celiac axis lymph nodes. These are  nonspecific findings. Lymphadenopathy may be reactive, lymphoma tips, or metastatic. Electronically Signed   By: Skipper Cliche M.D.   On: 08/29/2015 21:42   Ct Abdomen Pelvis W Contrast  08/29/2015  CLINICAL DATA:  Severe dyspnea on exertion, epigastric pain, chest EXAM: CT ANGIOGRAPHY CHEST CT ABDOMEN AND PELVIS WITH CONTRAST TECHNIQUE: Multidetector CT imaging of the chest was performed using the standard protocol during bolus administration of intravenous contrast. Multiplanar CT image reconstructions and MIPs were obtained to evaluate the vascular anatomy. Multidetector CT imaging of the abdomen and pelvis was performed using the standard protocol during bolus administration of intravenous contrast. CONTRAST:  168mL OMNIPAQUE IOHEXOL 350 MG/ML SOLN COMPARISON:  08/28/2015 chest radiograph FINDINGS: CTA CHEST FINDINGS No filling defects in the pulmonary arterial system. No evidence of thoracic aortic dilatation. Widespread mediastinal adenopathy including an 18 mm pretracheal lymph node, 25 mm subcarinal lymph node, and a 15 mm right hilar lymph node. Other enlarged lymph nodes are also present. Thoracic inlet and thyroid are normal. Two lead cardiac pacer noted. No pleural or pericardial effusion. No significant pulmonary parenchymal opacification on the right. Minimal dependent atelectasis right lung base. Minimal dependent atelectasis on the left. Mild discoid atelectasis posterior left apex. Left lung otherwise clear. No acute musculoskeletal abnormalities. Cardiac enlargement. Contrast seen extending into attic pains. Prominence of the cranial aspect of the inferior vena cava. CT ABDOMEN and PELVIS FINDINGS Diffuse hepatic steatosis. 7 mm stone in the neck of the gallbladder. Evidence of gallbladder wall thickening and mild pericholecystic inflammation. Pancreas is normal.  Spleen is normal.  Adrenal glands are  normal. 17 mm celiac axis lymph node. Motion artifact causes mild limitation in evaluation  of the kidneys, but these appear otherwise normal. Bladder is normal. Reproductive organs are normal. There is trace free fluid in the pelvis. Nonobstructive bowel gas pattern. No focal bowel abnormalities. Stool throughout the colon. Minimal inflammatory change bilaterally in the perinephric retroperitoneum, quite possibly not of acute origin. No acute musculoskeletal abnormalities. Review of the MIP images confirms the above findings. IMPRESSION: Evidence of diastolic dysfunction with contrast refluxing into hepatic veins. Cardiac enlargement. No evidence of pulmonary embolism. No significant parenchymal opacity to indicate pulmonary edema. Cholelithiasis. Findings suspicious for mild gallbladder wall thickening and possibly mild pericholecystic inflammation. Findings concerning for cholecystitis. If indicated consider limited right upper quadrant abdomen ultrasound. Prominent mediastinal lymph nodes, right hilar lymph node, and celiac axis lymph nodes. These are nonspecific findings. Lymphadenopathy may be reactive, lymphoma tips, or metastatic. Electronically Signed   By: Skipper Cliche M.D.   On: 08/29/2015 21:42   ASSESSMENT AND PLAN:  59 year old male with known chronic systolic dysfunction congestive heart failure coronary artery disease diabetes with complication essential hypertension , admitted after AICD firing  * RUQ PAIN: [place] and CT worrisome for cholecystitis and gall stones. Largest stone 1.4 cm . D/w Surgeon who will see him later   * Persistent Hypotension: Blood pressure in 100s now.  Did not require any pressors.  No signs of infection  * Severe hypokalemia: Repleted and resolved  * Chest pain - Appreciate cardiology input. AICD interrogation per Cardio - likely outpatient  * Elevated troponin - due to supply demand ischemia in the setting of chronic systolic heart failure, or potentially AICD firing. Monitor him on Tele.  * AICD discharge - d/w cardiology for AICD interrogation -  likely outpatient  * Type 2 diabetes mellitus ([place]) - continue Insulin sliding scale and metformin  * Chronic systolic heart failure ([facility]) - Hold lasix, Aldactone, Metoprolol, Lotensin due to Hypotension  * COPD (chronic obstructive pulmonary disease) ([place]) - continue azithromycin, DuoNeb's when necessary.  * Depression - continue LEXAPRO   Can transfer him back to telemetry.  Will ambulate and make sure  He is not symptomatic.  All the records are reviewed and case discussed with Care Management/Social Worker. Management plans discussed with the patient, Dr Azalee Course and he is in agreement.  CODE STATUS: Full Code  TOTAL TIME TAKING CARE OF THIS PATIENT: 35 minutes.   More than 50% of the time was spent in counseling/coordination of care: YES  POSSIBLE D/C IN 1-2 days, DEPENDING ON CLINICAL CONDITION.  And surgical evaluation   [facility], Janne Faulk M.D on 08/30/2015 at 8:47 AM  Between 7am to 6pm - Pager - [phone]  After 6pm go to [email or web address] - password [email or web address] [facility]  [facility]  Office  [phone]  CC: Primary care physician; Placido Sou, MD

## 2015-08-30 NOTE — Transfer of Care (Signed)
Immediate Anesthesia Transfer of Care Note  Patient: Ronald Miller.  Procedure(s) Performed: Procedure(s): LAPAROSCOPIC CHOLECYSTECTOMY (N/A)  Patient Location: PACU  Anesthesia Type:General   Level of Consciousness: awake, alert  and oriented  Airway & Oxygen Therapy: Patient Spontanous Breathing  Post-op Assessment: Report given to RN  Post vital signs: Reviewed and stable  Last Vitals:  Filed Vitals:   08/30/15 0754 08/30/15 1400  BP: 106/72 114/88  Pulse:  111  Temp:  36.8 C  Resp:  20    Complications: No apparent anesthesia complications

## 2015-08-30 NOTE — Consult Note (Signed)
PULMONARY / CRITICAL CARE MEDICINE   Name: Ronald Miller. MRN: [id number] DOB: [birth year]    ADMISSION DATE:  08/27/2015 CONSULTATION DATE:  08/28/15  REFERRING MD :  Dr. Max Sane   CHIEF COMPLAINT:   Low blood pressure, cough   HISTORY OF PRESENT ILLNESS    59 y.o. male who presents with chest pain after AICD discharge. Patient states that the AICD discharge woke up from his sleep. Since that time he has had chest discomfort which he describes as chest tightness located central chest area. He does not identify this chest pain is exertional, and denies any other associated symptoms including radiation, nausea/vomiting, dizziness, diaphoresis. Patient states that he has a history of chronic systolic congestive heart failure, but that he recognizes symptoms of heart failure exacerbation and does not feel like that is what is going on at this time. He does have a history of COPD as well, and states that he has had a significant cough for the last 2 months which just does not seem to want to improve. He occasionally coughs up some thick whitish sputum. He states that he does have fits of coughing and when she will almost lose consciousness due to such forceful coughing. He was admitted to the medical floor, but noted to have continued systolic blood pressure in the 80s, which is unusual for him. He will low systolic blood pressure he was is mentate well, given the low systolic blood pressure and a history of heart failure he was transferred to the ICU for closer monitoring. In the ICU he is mentating well, his MAP is in the 70s by cuff pressure, he does have slight lightheadedness. He states that his blood pressure usually runs in the 110s.  SUBJECTIVE: Patient with improving blood pressure overnight, still a little on the low side, but patient states he is feeling better. However, CT abdomen and pelvis with findings suggestive of acute cholecystitis, patient scheduled for surgical  intervention today  SIGNIFICANT EVENTS   11/26>> CT abdomen and pelvis with possible acute cholecystitis  PAST MEDICAL HISTORY    :  Past Medical History  Diagnosis Date  . ASCVD (arteriosclerotic cardiovascular disease)   . Diabetes mellitus   . Hypertension   . History of cocaine abuse   . Tobacco abuse   . Depression   . Suicidal ideation   . Chronic systolic CHF (congestive heart failure) ([place])   . COPD (chronic obstructive pulmonary disease) ([place])   . Coronary artery disease    Past Surgical History  Procedure Laterality Date  . Coronary artery bypass graft  2001  . Lumbar disc surgery  02/1999    Discectomy and fusion  . Vasectomy      Subsequent reversal  . Insert / replace / remove pacemaker    . Cardiac defibrillator placement     Prior to Admission medications   Medication Sig Start Date End Date Taking? Authorizing Provider  albuterol (PROVENTIL HFA;VENTOLIN HFA) 108 (90 BASE) MCG/ACT inhaler Inhale 2 puffs into the lungs every 6 (six) hours as needed for wheezing or shortness of breath.   Yes Historical Provider, MD  aspirin 325 MG tablet Take 325 mg by mouth daily.     Yes Historical Provider, MD  atorvastatin (LIPITOR) 20 MG tablet Take 20 mg by mouth every evening.   Yes Historical Provider, MD  baclofen (LIORESAL) 10 MG tablet Take 10 mg by mouth 3 (three) times daily.   Yes Historical Provider, MD  benazepril (LOTENSIN) 5  MG tablet Take 5 mg by mouth daily.   Yes Historical Provider, MD  escitalopram (LEXAPRO) 20 MG tablet Take 20 mg by mouth daily.   Yes Historical Provider, MD  furosemide (LASIX) 40 MG tablet Take 40 mg by mouth 2 (two) times daily. Take an additional 40mg   In AM if weight over 4 lbs   Yes Historical Provider, MD  insulin aspart (NOVOLOG) 100 UNIT/ML injection Inject 10 Units into the skin 3 (three) times daily before meals.   Yes Historical Provider, MD  insulin glargine (LANTUS) 100 UNIT/ML injection Inject 50 Units into the skin at  bedtime.   Yes Historical Provider, MD  metFORMIN (GLUCOPHAGE) 1000 MG tablet Take 1,000 mg by mouth 2 (two) times daily.   Yes Historical Provider, MD  metolazone (ZAROXOLYN) 5 MG tablet Take 5 mg by mouth daily as needed (for 4-5 pound weight gain).   Yes Historical Provider, MD  metoprolol succinate (TOPROL-XL) 100 MG 24 hr tablet Take 100 mg by mouth daily. Take with or immediately following a meal.   Yes Historical Provider, MD  potassium chloride SA (K-DUR,KLOR-CON) 20 MEQ tablet Take 20 mEq by mouth daily.   Yes Historical Provider, MD  spironolactone (ALDACTONE) 50 MG tablet Take 50 mg by mouth daily.   Yes Historical Provider, MD   Allergies  Allergen Reactions  . Codeine Nausea And Vomiting     FAMILY HISTORY   Family History  Problem Relation Age of Onset  . CAD    . Diabetes        SOCIAL HISTORY    reports that he has been smoking Cigarettes.  He has a 34.5 pack-year smoking history. He has never used smokeless tobacco. He reports that he does not drink alcohol or use illicit drugs.  Review of Systems  Constitutional: Negative for fever, chills and weight loss.  HENT: Positive for sore throat. Negative for congestion.   Eyes: Negative for blurred vision, double vision and photophobia.  Respiratory: Positive for cough and shortness of breath. Negative for hemoptysis, sputum production and wheezing.   Gastrointestinal: Positive for abdominal pain. Negative for heartburn, nausea and vomiting.  Skin: Negative for itching and rash.  Neurological: Negative for dizziness and tingling.      VITAL SIGNS    Temp:  [97.8 F (36.6 C)-98 F (36.7 C)] 98 F (36.7 C) (11/26 0553) Pulse Rate:  [78-97] 90 (11/26 0634) Resp:  [16-20] 16 (11/26 0553) BP: (86-109)/(54-74) 106/72 mmHg (11/26 0754) SpO2:  [91 %-98 %] 94 % (11/26 0949) Weight:  [236 lb (107.049 kg)-237 lb (107.502 kg)] 236 lb (107.049 kg) (11/26 0641) HEMODYNAMICS:   VENTILATOR SETTINGS:   INTAKE /  OUTPUT:  Intake/Output Summary (Last 24 hours) at 08/30/15 1141 Last data filed at 08/30/15 0900  Gross per 24 hour  Intake      0 ml  Output    450 ml  Net   -450 ml       PHYSICAL EXAM   Physical Exam  Constitutional: He is oriented to person, place, and time. He appears well-developed and well-nourished.  HENT:  Head: Normocephalic and atraumatic.  Right Ear: External ear normal.  Left Ear: External ear normal.  Mouth/Throat: Oropharynx is clear and moist.  Eyes: Conjunctivae and EOM are normal. Pupils are equal, round, and reactive to light.  Neck: Normal range of motion. Neck supple.  Pulmonary/Chest: Effort normal and breath sounds normal.  Faint upper lobe wheezes - improved  Abdominal:  RUQ tenderness  Musculoskeletal:  Trace pedal edema  Neurological: He is alert and oriented to person, place, and time.  Skin: Skin is warm and dry.  Psychiatric: He has a normal mood and affect.  Nursing note and vitals reviewed.      LABS   LABS:  CBC  Recent Labs Lab 08/28/15 0307 08/28/15 0920 08/29/15 0330  WBC 8.1 6.5 8.5  HGB 12.1* 12.2* 12.0*  HCT 36.2* 36.7* 35.9*  PLT 118* 119* 113*   Coag's No results for input(s): APTT, INR in the last 168 hours. BMET  Recent Labs Lab 08/28/15 0920 08/28/15 1906 08/29/15 0330 08/30/15 0510  NA 136  --  133* 136  K 2.9* 3.6 3.5 3.6  CL 95*  --  97* 99*  CO2 31  --  29 31  BUN 30*  --  39* 29*  CREATININE 1.14  --  1.34* 0.95  GLUCOSE 244*  --  194* 162*   Electrolytes  Recent Labs Lab 08/28/15 0307 08/28/15 0920 08/28/15 1154 08/29/15 0330 08/30/15 0510  CALCIUM  --  8.6*  --  8.3* 9.0  MG 1.7  --  1.7  --  1.7  PHOS  --   --   --   --  3.2   Sepsis Markers No results for input(s): LATICACIDVEN, PROCALCITON, O2SATVEN in the last 168 hours. ABG No results for input(s): PHART, PCO2ART, PO2ART in the last 168 hours. Liver Enzymes  Recent Labs Lab 08/27/15 2317  AST 24  ALT 18  ALKPHOS 48   BILITOT 0.7  ALBUMIN 3.8   Cardiac Enzymes  Recent Labs Lab 08/28/15 0307 08/28/15 0920 08/28/15 1154  TROPONINI 0.07* 0.06* 0.05*   Glucose  Recent Labs Lab 08/28/15 2142 08/29/15 0804 08/29/15 1208 08/29/15 1641 08/29/15 2157 08/30/15 0727  GLUCAP 234* 225* 274* 235* 145* 152*     Recent Results (from the past 240 hour(s))  MRSA PCR Screening     Status: Abnormal   Collection Time: 08/28/15 10:41 AM  Result Value Ref Range Status   MRSA by PCR POSITIVE (A) NEGATIVE Final    Comment:        The GeneXpert MRSA Assay (FDA approved for NASAL specimens only), is one component of a comprehensive MRSA colonization surveillance program. It is not intended to diagnose MRSA infection nor to guide or monitor treatment for MRSA infections. CRITICAL RESULT CALLED TO, READ BACK BY AND VERIFIED WITH: Aldona Bar ROUSE AT 1229 08/28/15 DAS   Culture, blood (routine x 2)     Status: None (Preliminary result)   Collection Time: 08/28/15 11:55 AM  Result Value Ref Range Status   Specimen Description BLOOD RIGHT AC  Final   Special Requests   Final    BOTTLES DRAWN AEROBIC AND ANAEROBIC  AER 2CC ANA 3CC   Culture NO GROWTH 2 DAYS  Final   Report Status PENDING  Incomplete  Culture, blood (routine x 2)     Status: None (Preliminary result)   Collection Time: 08/28/15 12:07 PM  Result Value Ref Range Status   Specimen Description BLOOD LEFT AC  Final   Special Requests   Final    BOTTLES DRAWN AEROBIC AND ANAEROBIC  AER 1CC ANA 3CC   Culture NO GROWTH 2 DAYS  Final   Report Status PENDING  Incomplete     Current facility-administered medications:  .  acetaminophen (TYLENOL) tablet 650 mg, 650 mg, Oral, Q6H PRN **OR** acetaminophen (TYLENOL) suppository 650 mg, 650 mg, Rectal, Q6H PRN, Lance Coon, MD .  ALPRAZolam (XANAX) tablet 0.25 mg, 0.25 mg, Oral, TID PRN, Laverle Hobby, MD, 0.25 mg at 08/28/15 1619 .  aspirin tablet 325 mg, 325 mg, Oral, Daily, Lance Coon,  MD, Stopped at 08/30/15 [phone] .  atorvastatin (LIPITOR) tablet 20 mg, 20 mg, Oral, QPM, Lance Coon, MD, 20 mg at 08/29/15 1652 .  Chlorhexidine Gluconate Cloth 2 % PADS 6 each, 6 each, Topical, Q0600, Max Sane, MD, 6 each at 08/30/15 [phone] .  doxycycline (VIBRA-TABS) tablet 100 mg, 100 mg, Oral, Q12H, Vilinda Boehringer, MD, Stopped at 08/30/15 0948 .  enoxaparin (LOVENOX) injection 40 mg, 40 mg, Subcutaneous, QHS, Lance Coon, MD, 40 mg at 08/29/15 2204 .  escitalopram (LEXAPRO) tablet 20 mg, 20 mg, Oral, Daily, Lance Coon, MD, 20 mg at 08/29/15 1037 .  guaiFENesin (ROBITUSSIN) 100 MG/5ML solution 100 mg, 5 mL, Oral, Q6H PRN, Vaughan Basta, MD, 100 mg at 08/29/15 2203 .  insulin aspart (novoLOG) injection 0-5 Units, 0-5 Units, Subcutaneous, QHS, Jordon Kristiansen, MD, 2 Units at 08/28/15 2251 .  insulin aspart (novoLOG) injection 0-9 Units, 0-9 Units, Subcutaneous, TID WC, Vilinda Boehringer, MD, 2 Units at 08/30/15 0835 .  ipratropium-albuterol (DUONEB) 0.5-2.5 (3) MG/3ML nebulizer solution 3 mL, 3 mL, Nebulization, Q6H PRN, Lennyn Bellanca, MD .  metFORMIN (GLUCOPHAGE) tablet 1,000 mg, 1,000 mg, Oral, BID WC, Lance Coon, MD, Stopped at 08/30/15 0745 .  mometasone-formoterol (DULERA) 200-5 MCG/ACT inhaler 2 puff, 2 puff, Inhalation, BID, Vilinda Boehringer, MD, 2 puff at 08/30/15 0835 .  mupirocin ointment (BACTROBAN) 2 % 1 application, 1 application, Nasal, BID, Max Sane, MD, 1 application at [phone] .  ondansetron (ZOFRAN) tablet 4 mg, 4 mg, Oral, Q6H PRN **OR** ondansetron (ZOFRAN) injection 4 mg, 4 mg, Intravenous, Q6H PRN, Lance Coon, MD .  sodium chloride 0.9 % injection 3 mL, 3 mL, Intravenous, Q12H, Lance Coon, MD, 3 mL at 08/30/15 0948 .  zolpidem (AMBIEN) tablet 10 mg, 10 mg, Oral, QHS PRN, Laverle Hobby, MD, 10 mg at 08/29/15 2203  IMAGING    Ct Angio Chest Pe W/cm &/or Wo Cm  08/29/2015  CLINICAL DATA:  Severe dyspnea on exertion, epigastric pain, chest EXAM: CT  ANGIOGRAPHY CHEST CT ABDOMEN AND PELVIS WITH CONTRAST TECHNIQUE: Multidetector CT imaging of the chest was performed using the standard protocol during bolus administration of intravenous contrast. Multiplanar CT image reconstructions and MIPs were obtained to evaluate the vascular anatomy. Multidetector CT imaging of the abdomen and pelvis was performed using the standard protocol during bolus administration of intravenous contrast. CONTRAST:  162mL OMNIPAQUE IOHEXOL 350 MG/ML SOLN COMPARISON:  08/28/2015 chest radiograph FINDINGS: CTA CHEST FINDINGS No filling defects in the pulmonary arterial system. No evidence of thoracic aortic dilatation. Widespread mediastinal adenopathy including an 18 mm pretracheal lymph node, 25 mm subcarinal lymph node, and a 15 mm right hilar lymph node. Other enlarged lymph nodes are also present. Thoracic inlet and thyroid are normal. Two lead cardiac pacer noted. No pleural or pericardial effusion. No significant pulmonary parenchymal opacification on the right. Minimal dependent atelectasis right lung base. Minimal dependent atelectasis on the left. Mild discoid atelectasis posterior left apex. Left lung otherwise clear. No acute musculoskeletal abnormalities. Cardiac enlargement. Contrast seen extending into attic pains. Prominence of the cranial aspect of the inferior vena cava. CT ABDOMEN and PELVIS FINDINGS Diffuse hepatic steatosis. 7 mm stone in the neck of the gallbladder. Evidence of gallbladder wall thickening and mild pericholecystic inflammation. Pancreas is normal.  Spleen is normal.  Adrenal glands are normal. 17  mm celiac axis lymph node. Motion artifact causes mild limitation in evaluation of the kidneys, but these appear otherwise normal. Bladder is normal. Reproductive organs are normal. There is trace free fluid in the pelvis. Nonobstructive bowel gas pattern. No focal bowel abnormalities. Stool throughout the colon. Minimal inflammatory change bilaterally in the  perinephric retroperitoneum, quite possibly not of acute origin. No acute musculoskeletal abnormalities. Review of the MIP images confirms the above findings. IMPRESSION: Evidence of diastolic dysfunction with contrast refluxing into hepatic veins. Cardiac enlargement. No evidence of pulmonary embolism. No significant parenchymal opacity to indicate pulmonary edema. Cholelithiasis. Findings suspicious for mild gallbladder wall thickening and possibly mild pericholecystic inflammation. Findings concerning for cholecystitis. If indicated consider limited right upper quadrant abdomen ultrasound. Prominent mediastinal lymph nodes, right hilar lymph node, and celiac axis lymph nodes. These are nonspecific findings. Lymphadenopathy may be reactive, lymphoma tips, or metastatic. Electronically Signed   By: Skipper Cliche M.D.   On: 08/29/2015 21:42   Ct Abdomen Pelvis W Contrast  08/29/2015  CLINICAL DATA:  Severe dyspnea on exertion, epigastric pain, chest EXAM: CT ANGIOGRAPHY CHEST CT ABDOMEN AND PELVIS WITH CONTRAST TECHNIQUE: Multidetector CT imaging of the chest was performed using the standard protocol during bolus administration of intravenous contrast. Multiplanar CT image reconstructions and MIPs were obtained to evaluate the vascular anatomy. Multidetector CT imaging of the abdomen and pelvis was performed using the standard protocol during bolus administration of intravenous contrast. CONTRAST:  148mL OMNIPAQUE IOHEXOL 350 MG/ML SOLN COMPARISON:  08/28/2015 chest radiograph FINDINGS: CTA CHEST FINDINGS No filling defects in the pulmonary arterial system. No evidence of thoracic aortic dilatation. Widespread mediastinal adenopathy including an 18 mm pretracheal lymph node, 25 mm subcarinal lymph node, and a 15 mm right hilar lymph node. Other enlarged lymph nodes are also present. Thoracic inlet and thyroid are normal. Two lead cardiac pacer noted. No pleural or pericardial effusion. No significant pulmonary  parenchymal opacification on the right. Minimal dependent atelectasis right lung base. Minimal dependent atelectasis on the left. Mild discoid atelectasis posterior left apex. Left lung otherwise clear. No acute musculoskeletal abnormalities. Cardiac enlargement. Contrast seen extending into attic pains. Prominence of the cranial aspect of the inferior vena cava. CT ABDOMEN and PELVIS FINDINGS Diffuse hepatic steatosis. 7 mm stone in the neck of the gallbladder. Evidence of gallbladder wall thickening and mild pericholecystic inflammation. Pancreas is normal.  Spleen is normal.  Adrenal glands are normal. 17 mm celiac axis lymph node. Motion artifact causes mild limitation in evaluation of the kidneys, but these appear otherwise normal. Bladder is normal. Reproductive organs are normal. There is trace free fluid in the pelvis. Nonobstructive bowel gas pattern. No focal bowel abnormalities. Stool throughout the colon. Minimal inflammatory change bilaterally in the perinephric retroperitoneum, quite possibly not of acute origin. No acute musculoskeletal abnormalities. Review of the MIP images confirms the above findings. IMPRESSION: Evidence of diastolic dysfunction with contrast refluxing into hepatic veins. Cardiac enlargement. No evidence of pulmonary embolism. No significant parenchymal opacity to indicate pulmonary edema. Cholelithiasis. Findings suspicious for mild gallbladder wall thickening and possibly mild pericholecystic inflammation. Findings concerning for cholecystitis. If indicated consider limited right upper quadrant abdomen ultrasound. Prominent mediastinal lymph nodes, right hilar lymph node, and celiac axis lymph nodes. These are nonspecific findings. Lymphadenopathy may be reactive, lymphoma tips, or metastatic. Electronically Signed   By: Skipper Cliche M.D.   On: 08/29/2015 21:42   US Abdomen Limited Ruq  08/30/2015  CLINICAL DATA:  Epigastric abdominal pain, chronic. EXAM: US  ABDOMEN  LIMITED - RIGHT UPPER QUADRANT COMPARISON:  08/29/2015 CT abdomen/pelvis. FINDINGS: Gallbladder: The nondistended gallbladder contains a few shadowing mobile calcified gallstones measuring up to 1.4 cm in size. There is mild diffuse gallbladder wall thickening. There are punctate echogenic foci with reverberation artifact in the nondependent gallbladder wall, indicating adenomyomatosis. No pericholecystic fluid or sonographic Murphy sign. Common bile duct: Diameter: 6 mm no choledocholithiasis, noting nonvisualization of the distal common bile duct due to overlying bowel gas. Liver: No focal lesion identified. Within normal limits in parenchymal echogenicity. IMPRESSION: 1. Cholelithiasis. Nondistended gallbladder. Nonspecific mild diffuse gallbladder wall thickening with evidence of adenomyomatosis of the gallbladder. No pericholecystic fluid. No sonographic Murphy sign. Findings favor chronic cholecystitis. 2. Bile ducts are within normal limits (common bile duct diameter 6 mm). 3. Unremarkable liver. Electronically Signed   By: Ilona Sorrel M.D.   On: 08/30/2015 09:25      Indwelling Urinary Catheter continued, requirement due to   Reason to continue Indwelling Urinary Catheter for strict Intake/Output monitoring for hemodynamic instability   Central Line continued, requirement due to   Reason to continue [employer] Monitoring of central venous pressure or other hemodynamic parameters   Ventilator continued, requirement due to, resp failure    Ventilator Sedation RASS 0 to -2   Cultures: BCx2  UC  Sputum  Antibiotics: Doxycycline 11/24>>  Lines:   ASSESSMENT/PLAN  59 year old male with chronic systolic heart failure, coronary artery disease, essential hypertension, status post defibrillator, had some hypotension requiring short stay in ICU, now with acute cholecystitis.  PULMONARY Mild shortness of breath, cough Perioperative pulmonary clearance -Chest x-ray with no  infiltrates or vascular congestion, no signs of fluid overload or pulmonary edema -Remote history of COPD -Only on as needed albuterol at home -Continue with observation, shortness of breath increases may consider adding Pulmicort nebulizers -He does have some faint upper lobe wheezes, will schedule duo nebs for one day and then continue as needed -Continue with doxycycline 1 tab by mouth twice a day 10 days, start Dulera 200 - patient with know COPD, not on any inhalers.  -Patient at moderate risk for postoperative pulmonary, location such as prolonged need for ventilator, positive pressure initiation. Recommend nebulizer treatment prior to surgical intervention and if able to extubate, then extubate to CPAP or nasal cannula.  CARDIOVASCULAR Systolic heart failure status post defibrillator Coronary artery disease Essential Hypertension Hypotension -Monitor blood pressure closely, improving today -Systolic blood pressure is currently soft, not sure he'll be able to tolerate Lasix or beta blockers.  Cardiology following -Unknown reason for AICD discharge, his troponins are mildly elevated, we'll trend out -Given stable MAP, and mentating well, no need for pressors at this time, and can be transferred to a monitored bed on the medical floor   RENAL -Monitor I's and O's and urine output -Monitor BMP  GASTROINTESTINAL ? Cholecystitis -Being followed by surgery, scheduled for surgical intervention today -PPI    Pulmonary consult time devoted to patient care services described in this note is 45 minutes.    Vilinda Boehringer, MD [facility] Pager [phone] (please enter 7-digits) On Call Pager [phone] (please enter 7-digits)     08/30/2015, 11:41 AM  Note: This note was prepared with Dragon dictation along with smaller phrase technology. Any transcriptional errors that result from this process are unintentional.

## 2015-08-30 NOTE — H&P (Signed)
Ronald D Birkey Jr. is an 59 y.o. male.   Chief Complaint: epigastric and RUQ pain HPI: 59 yr old male with multiple medical issues, originally admitted for misfiring of ACD.  Patient then continued to have worsening epigastric to RUQ pain.  Patient states the pain is 7/10 coming and going in waves but never leaves.  It moves around the RUQ side and into the back and beneath the shoulder blade.  He has had some nausea but no vomiting.  He has had many episodes like this in the past but doesn't associate them with any type of food or eating.  He states he has been having chills as well but no fever.    Past Medical History  Diagnosis Date  . ASCVD (arteriosclerotic cardiovascular disease)   . Diabetes mellitus   . Hypertension   . History of cocaine abuse   . Tobacco abuse   . Depression   . Suicidal ideation   . Chronic systolic CHF (congestive heart failure) (HCC)   . COPD (chronic obstructive pulmonary disease) (HCC)   . Coronary artery disease     Past Surgical History  Procedure Laterality Date  . Coronary artery bypass graft  2001  . Lumbar disc surgery  02/1999    Discectomy and fusion  . Vasectomy      Subsequent reversal  . Insert / replace / remove pacemaker    . Cardiac defibrillator placement      Family History  Problem Relation Age of Onset  . CAD    . Diabetes     Social History:  reports that he has been smoking Cigarettes.  He has a 34.5 pack-year smoking history. He has never used smokeless tobacco. He reports that he does not drink alcohol or use illicit drugs.  Allergies:  Allergies  Allergen Reactions  . Codeine Nausea And Vomiting    Medications Prior to Admission  Medication Sig Dispense Refill  . albuterol (PROVENTIL HFA;VENTOLIN HFA) 108 (90 BASE) MCG/ACT inhaler Inhale 2 puffs into the lungs every 6 (six) hours as needed for wheezing or shortness of breath.    . aspirin 325 MG tablet Take 325 mg by mouth daily.      . atorvastatin (LIPITOR) 20 MG  tablet Take 20 mg by mouth every evening.    . baclofen (LIORESAL) 10 MG tablet Take 10 mg by mouth 3 (three) times daily.    . benazepril (LOTENSIN) 5 MG tablet Take 5 mg by mouth daily.    . escitalopram (LEXAPRO) 20 MG tablet Take 20 mg by mouth daily.    . furosemide (LASIX) 40 MG tablet Take 40 mg by mouth 2 (two) times daily. Take an additional 40mg  In AM if weight over 4 lbs    . insulin aspart (NOVOLOG) 100 UNIT/ML injection Inject 10 Units into the skin 3 (three) times daily before meals.    . insulin glargine (LANTUS) 100 UNIT/ML injection Inject 50 Units into the skin at bedtime.    . metFORMIN (GLUCOPHAGE) 1000 MG tablet Take 1,000 mg by mouth 2 (two) times daily.    . metolazone (ZAROXOLYN) 5 MG tablet Take 5 mg by mouth daily as needed (for 4-5 pound weight gain).    . metoprolol succinate (TOPROL-XL) 100 MG 24 hr tablet Take 100 mg by mouth daily. Take with or immediately following a meal.    . potassium chloride SA (K-DUR,KLOR-CON) 20 MEQ tablet Take 20 mEq by mouth daily.    . spironolactone (ALDACTONE) 50   MG tablet Take 50 mg by mouth daily.      Results for orders placed or performed during the hospital encounter of 08/27/15 (from the past 48 hour(s))  Troponin I     Status: Abnormal   Collection Time: 08/28/15 11:54 AM  Result Value Ref Range   Troponin I 0.05 (H) <0.031 ng/mL    Comment: RESULTS PREVIOUSLY CALLED  08/28/15 AT 0026 BY WDM/DAS        PERSISTENTLY INCREASED TROPONIN VALUES IN THE RANGE OF 0.04-0.49 ng/mL CAN BE SEEN IN:       -UNSTABLE ANGINA       -CONGESTIVE HEART FAILURE       -MYOCARDITIS       -CHEST TRAUMA       -ARRYHTHMIAS       -LATE PRESENTING MYOCARDIAL INFARCTION       -COPD   CLINICAL FOLLOW-UP RECOMMENDED.   Magnesium     Status: None   Collection Time: 08/28/15 11:54 AM  Result Value Ref Range   Magnesium 1.7 1.7 - 2.4 mg/dL  [facility] only)     Status: None   Collection Time: 08/28/15 11:54 AM  Result Value Ref Range   CK,  MB 2.6 0.5 - 5.0 ng/mL  Culture, blood (routine x 2)     Status: None (Preliminary result)   Collection Time: 08/28/15 11:55 AM  Result Value Ref Range   Specimen Description BLOOD RIGHT AC    Special Requests      BOTTLES DRAWN AEROBIC AND ANAEROBIC  AER 2CC ANA 3CC   Culture NO GROWTH 2 DAYS    Report Status PENDING   Culture, blood (routine x 2)     Status: None (Preliminary result)   Collection Time: 08/28/15 12:07 PM  Result Value Ref Range   Specimen Description BLOOD LEFT AC    Special Requests      BOTTLES DRAWN AEROBIC AND ANAEROBIC  AER 1CC ANA 3CC   Culture NO GROWTH 2 DAYS    Report Status PENDING   Glucose, capillary     Status: Abnormal   Collection Time: 08/28/15  4:23 PM  Result Value Ref Range   Glucose-Capillary 163 (H) 65 - 99 mg/dL  Potassium     Status: None   Collection Time: 08/28/15  7:06 PM  Result Value Ref Range   Potassium 3.6 3.5 - 5.1 mmol/L  [facility] only)     Status: None   Collection Time: 08/28/15  7:06 PM  Result Value Ref Range   CK, MB 2.7 0.5 - 5.0 ng/mL  Glucose, capillary     Status: Abnormal   Collection Time: 08/28/15  9:42 PM  Result Value Ref Range   Glucose-Capillary 234 (H) 65 - 99 mg/dL  [facility] only)     Status: None   Collection Time: 08/29/15  1:09 AM  Result Value Ref Range   CK, MB 2.7 0.5 - 5.0 ng/mL  CBC     Status: Abnormal   Collection Time: 08/29/15  3:30 AM  Result Value Ref Range   WBC 8.5 3.8 - 10.6 K/uL   RBC 3.90 (L) 4.40 - 5.90 MIL/uL   Hemoglobin 12.0 (L) 13.0 - 18.0 g/dL   HCT 35.9 (L) 40.0 - 52.0 %   MCV 92.0 80.0 - 100.0 fL   MCH 30.8 26.0 - 34.0 pg   MCHC 33.5 32.0 - 36.0 g/dL   RDW 13.1 11.5 - 14.5 %   Platelets 113 (L) 150 - 440 K/uL  Basic  metabolic panel     Status: Abnormal   Collection Time: 08/29/15  3:30 AM  Result Value Ref Range   Sodium 133 (L) 135 - 145 mmol/L   Potassium 3.5 3.5 - 5.1 mmol/L   Chloride 97 (L) 101 - 111 mmol/L   CO2 29 22 - 32 mmol/L   Glucose, Bld 194 (H) 65 -  99 mg/dL   BUN 39 (H) 6 - 20 mg/dL   Creatinine, Ser 1.34 (H) 0.61 - 1.24 mg/dL   Calcium 8.3 (L) 8.9 - 10.3 mg/dL   GFR calc non Af Amer 57 (L) >60 mL/min   GFR calc Af Amer >60 >60 mL/min    Comment: (NOTE) The eGFR has been calculated using the CKD EPI equation. This calculation has not been validated in all clinical situations. eGFR's persistently <60 mL/min signify possible Chronic Kidney Disease.    Anion gap 7 5 - 15  Glucose, capillary     Status: Abnormal   Collection Time: 08/29/15  8:04 AM  Result Value Ref Range   Glucose-Capillary 225 (H) 65 - 99 mg/dL  Glucose, capillary     Status: Abnormal   Collection Time: 08/29/15 12:08 PM  Result Value Ref Range   Glucose-Capillary 274 (H) 65 - 99 mg/dL  Glucose, capillary     Status: Abnormal   Collection Time: 08/29/15  4:41 PM  Result Value Ref Range   Glucose-Capillary 235 (H) 65 - 99 mg/dL   Comment 1 Notify RN   Glucose, capillary     Status: Abnormal   Collection Time: 08/29/15  9:57 PM  Result Value Ref Range   Glucose-Capillary 145 (H) 65 - 99 mg/dL  Basic metabolic panel     Status: Abnormal   Collection Time: 08/30/15  5:10 AM  Result Value Ref Range   Sodium 136 135 - 145 mmol/L   Potassium 3.6 3.5 - 5.1 mmol/L   Chloride 99 (L) 101 - 111 mmol/L   CO2 31 22 - 32 mmol/L   Glucose, Bld 162 (H) 65 - 99 mg/dL   BUN 29 (H) 6 - 20 mg/dL   Creatinine, Ser 0.95 0.61 - 1.24 mg/dL   Calcium 9.0 8.9 - 10.3 mg/dL   GFR calc non Af Amer >60 >60 mL/min   GFR calc Af Amer >60 >60 mL/min    Comment: (NOTE) The eGFR has been calculated using the CKD EPI equation. This calculation has not been validated in all clinical situations. eGFR's persistently <60 mL/min signify possible Chronic Kidney Disease.    Anion gap 6 5 - 15  Magnesium     Status: None   Collection Time: 08/30/15  5:10 AM  Result Value Ref Range   Magnesium 1.7 1.7 - 2.4 mg/dL  Phosphorus     Status: None   Collection Time: 08/30/15  5:10 AM  Result  Value Ref Range   Phosphorus 3.2 2.5 - 4.6 mg/dL  Glucose, capillary     Status: Abnormal   Collection Time: 08/30/15  7:27 AM  Result Value Ref Range   Glucose-Capillary 152 (H) 65 - 99 mg/dL   Comment 1 Notify RN    Ct Angio Chest Pe W/cm &/or Wo Cm  08/29/2015  CLINICAL DATA:  Severe dyspnea on exertion, epigastric pain, chest EXAM: CT ANGIOGRAPHY CHEST CT ABDOMEN AND PELVIS WITH CONTRAST TECHNIQUE: Multidetector CT imaging of the chest was performed using the standard protocol during bolus administration of intravenous contrast. Multiplanar CT image reconstructions and MIPs were obtained to evaluate   the vascular anatomy. Multidetector CT imaging of the abdomen and pelvis was performed using the standard protocol during bolus administration of intravenous contrast. CONTRAST:  168m OMNIPAQUE IOHEXOL 350 MG/ML SOLN COMPARISON:  08/28/2015 chest radiograph FINDINGS: CTA CHEST FINDINGS No filling defects in the pulmonary arterial system. No evidence of thoracic aortic dilatation. Widespread mediastinal adenopathy including an 18 mm pretracheal lymph node, 25 mm subcarinal lymph node, and a 15 mm right hilar lymph node. Other enlarged lymph nodes are also present. Thoracic inlet and thyroid are normal. Two lead cardiac pacer noted. No pleural or pericardial effusion. No significant pulmonary parenchymal opacification on the right. Minimal dependent atelectasis right lung base. Minimal dependent atelectasis on the left. Mild discoid atelectasis posterior left apex. Left lung otherwise clear. No acute musculoskeletal abnormalities. Cardiac enlargement. Contrast seen extending into attic pains. Prominence of the cranial aspect of the inferior vena cava. CT ABDOMEN and PELVIS FINDINGS Diffuse hepatic steatosis. 7 mm stone in the neck of the gallbladder. Evidence of gallbladder wall thickening and mild pericholecystic inflammation. Pancreas is normal.  Spleen is normal.  Adrenal glands are normal. 17 mm celiac  axis lymph node. Motion artifact causes mild limitation in evaluation of the kidneys, but these appear otherwise normal. Bladder is normal. Reproductive organs are normal. There is trace free fluid in the pelvis. Nonobstructive bowel gas pattern. No focal bowel abnormalities. Stool throughout the colon. Minimal inflammatory change bilaterally in the perinephric retroperitoneum, quite possibly not of acute origin. No acute musculoskeletal abnormalities. Review of the MIP images confirms the above findings. IMPRESSION: Evidence of diastolic dysfunction with contrast refluxing into hepatic veins. Cardiac enlargement. No evidence of pulmonary embolism. No significant parenchymal opacity to indicate pulmonary edema. Cholelithiasis. Findings suspicious for mild gallbladder wall thickening and possibly mild pericholecystic inflammation. Findings concerning for cholecystitis. If indicated consider limited right upper quadrant abdomen ultrasound. Prominent mediastinal lymph nodes, right hilar lymph node, and celiac axis lymph nodes. These are nonspecific findings. Lymphadenopathy may be reactive, lymphoma tips, or metastatic. Electronically Signed   By: RSkipper ClicheM.D.   On: 08/29/2015 21:42   Ct Abdomen Pelvis W Contrast  08/29/2015  CLINICAL DATA:  Severe dyspnea on exertion, epigastric pain, chest EXAM: CT ANGIOGRAPHY CHEST CT ABDOMEN AND PELVIS WITH CONTRAST TECHNIQUE: Multidetector CT imaging of the chest was performed using the standard protocol during bolus administration of intravenous contrast. Multiplanar CT image reconstructions and MIPs were obtained to evaluate the vascular anatomy. Multidetector CT imaging of the abdomen and pelvis was performed using the standard protocol during bolus administration of intravenous contrast. CONTRAST:  103mOMNIPAQUE IOHEXOL 350 MG/ML SOLN COMPARISON:  08/28/2015 chest radiograph FINDINGS: CTA CHEST FINDINGS No filling defects in the pulmonary arterial system. No  evidence of thoracic aortic dilatation. Widespread mediastinal adenopathy including an 18 mm pretracheal lymph node, 25 mm subcarinal lymph node, and a 15 mm right hilar lymph node. Other enlarged lymph nodes are also present. Thoracic inlet and thyroid are normal. Two lead cardiac pacer noted. No pleural or pericardial effusion. No significant pulmonary parenchymal opacification on the right. Minimal dependent atelectasis right lung base. Minimal dependent atelectasis on the left. Mild discoid atelectasis posterior left apex. Left lung otherwise clear. No acute musculoskeletal abnormalities. Cardiac enlargement. Contrast seen extending into attic pains. Prominence of the cranial aspect of the inferior vena cava. CT ABDOMEN and PELVIS FINDINGS Diffuse hepatic steatosis. 7 mm stone in the neck of the gallbladder. Evidence of gallbladder wall thickening and mild pericholecystic inflammation. Pancreas is normal.  Spleen  is normal.  Adrenal glands are normal. 17 mm celiac axis lymph node. Motion artifact causes mild limitation in evaluation of the kidneys, but these appear otherwise normal. Bladder is normal. Reproductive organs are normal. There is trace free fluid in the pelvis. Nonobstructive bowel gas pattern. No focal bowel abnormalities. Stool throughout the colon. Minimal inflammatory change bilaterally in the perinephric retroperitoneum, quite possibly not of acute origin. No acute musculoskeletal abnormalities. Review of the MIP images confirms the above findings. IMPRESSION: Evidence of diastolic dysfunction with contrast refluxing into hepatic veins. Cardiac enlargement. No evidence of pulmonary embolism. No significant parenchymal opacity to indicate pulmonary edema. Cholelithiasis. Findings suspicious for mild gallbladder wall thickening and possibly mild pericholecystic inflammation. Findings concerning for cholecystitis. If indicated consider limited right upper quadrant abdomen ultrasound. Prominent  mediastinal lymph nodes, right hilar lymph node, and celiac axis lymph nodes. These are nonspecific findings. Lymphadenopathy may be reactive, lymphoma tips, or metastatic. Electronically Signed   By: Raymond  Rubner M.D.   On: 08/29/2015 21:42   Us Abdomen Limited Ruq  08/30/2015  CLINICAL DATA:  Epigastric abdominal pain, chronic. EXAM: US ABDOMEN LIMITED - RIGHT UPPER QUADRANT COMPARISON:  08/29/2015 CT abdomen/pelvis. FINDINGS: Gallbladder: The nondistended gallbladder contains a few shadowing mobile calcified gallstones measuring up to 1.4 cm in size. There is mild diffuse gallbladder wall thickening. There are punctate echogenic foci with reverberation artifact in the nondependent gallbladder wall, indicating adenomyomatosis. No pericholecystic fluid or sonographic Murphy sign. Common bile duct: Diameter: 6 mm no choledocholithiasis, noting nonvisualization of the distal common bile duct due to overlying bowel gas. Liver: No focal lesion identified. Within normal limits in parenchymal echogenicity. IMPRESSION: 1. Cholelithiasis. Nondistended gallbladder. Nonspecific mild diffuse gallbladder wall thickening with evidence of adenomyomatosis of the gallbladder. No pericholecystic fluid. No sonographic Murphy sign. Findings favor chronic cholecystitis. 2. Bile ducts are within normal limits (common bile duct diameter 6 mm). 3. Unremarkable liver. Electronically Signed   By: Jason A Poff M.D.   On: 08/30/2015 09:25    Review of Systems  Constitutional: Positive for chills. Negative for fever, weight loss, malaise/fatigue and diaphoresis.  HENT: Negative for congestion.   Respiratory: Negative for cough, shortness of breath and wheezing.   Cardiovascular: Positive for chest pain and palpitations. Negative for leg swelling.  Gastrointestinal: Positive for heartburn, nausea and abdominal pain. Negative for vomiting, diarrhea, constipation, blood in stool and melena.  Genitourinary: Negative for dysuria,  urgency and hematuria.  Musculoskeletal: Negative for back pain and falls.  Skin: Negative for itching and rash.  Neurological: Positive for weakness. Negative for dizziness, loss of consciousness and headaches.  Psychiatric/Behavioral: The patient is not nervous/anxious.   All other systems reviewed and are negative.   Blood pressure 106/72, pulse 90, temperature 98 F (36.7 C), temperature source Oral, resp. rate 16, height 5' 9" (1.753 m), weight 236 lb (107.049 kg), SpO2 94 %. Physical Exam  Vitals reviewed. Constitutional: He is oriented to person, place, and time. He appears well-developed and well-nourished. No distress.  HENT:  Head: Normocephalic and atraumatic.  Right Ear: External ear normal.  Left Ear: External ear normal.  Nose: Nose normal.  Mouth/Throat: Oropharynx is clear and moist.  Eyes: Conjunctivae and EOM are normal. Pupils are equal, round, and reactive to light. No scleral icterus.  Neck: Normal range of motion. Neck supple. No tracheal deviation present.  Cardiovascular: Normal rate, regular rhythm, normal heart sounds and intact distal pulses.  Exam reveals no gallop and no friction rub.   No murmur   heard. Respiratory: Effort normal and breath sounds normal. No respiratory distress. He has no wheezes. He has no rales.  Well healed sternal incision  GI: Soft. Bowel sounds are normal. He exhibits no distension and no mass. There is tenderness. There is guarding. There is no rebound.  Epigastric and RUQ significant tenderness with + Murphys sign  Musculoskeletal: Normal range of motion. He exhibits no edema or tenderness.  Neurological: He is alert and oriented to person, place, and time. No cranial nerve deficit.  Skin: Skin is warm and dry. No rash noted. No erythema. No pallor.  Psychiatric: He has a normal mood and affect. His behavior is normal. Judgment and thought content normal.     Assessment/Plan Acute on chronic cholecystitis: I have personally  reviewed his past medical history and notes for this admission.  I have personally reviewed his laboratory findings, CT scan and U/S images and radiology reads as stated above.  He does have + Murphy's clinically with large stone in gallbladder neck, wall thickening and some fluid.  The risks, benefits, complications, treatment options, and expected outcomes were discussed with the patient. The possibilities of bleeding, recurrent infection, finding a normal gallbladder, perforation of viscus organs, damage to surrounding structures, bile leak, abscess formation, needing a drain placed, the need for additional procedures, reaction to medication, pulmonary aspiration,  failure to diagnose a condition, the possible need to convert to an open procedure, and creating a complication requiring transfusion or operation were discussed with the patient. The patient and/or family concurred with the proposed plan, giving informed consent.   L[place]ona [place] Shawntina Diffee 08/30/2015, 11:52 AM

## 2015-08-30 NOTE — Anesthesia Procedure Notes (Signed)
Procedure Name: Intubation Date/Time: 08/30/2015 12:12 PM Performed by: Eliberto Ivory Pre-anesthesia Checklist: Patient identified, Patient being monitored, Timeout performed, Emergency Drugs available and Suction available Patient Re-evaluated:Patient Re-evaluated prior to inductionOxygen Delivery Method: Circle system utilized Preoxygenation: Pre-oxygenation with 100% oxygen Intubation Type: IV induction Ventilation: Mask ventilation without difficulty Laryngoscope Size: Mac and 3 Grade View: Grade I Tube type: Oral Tube size: 7.5 mm Number of attempts: 1 Airway Equipment and Method: Stylet Placement Confirmation: ETT inserted through vocal cords under direct vision,  positive ETCO2 and breath sounds checked- equal and bilateral Secured at: 21 cm Tube secured with: Tape Dental Injury: Teeth and Oropharynx as per pre-operative assessment

## 2015-08-30 NOTE — Progress Notes (Signed)
PHARMACY - CRITICAL CARE PROGRESS NOTE  Pharmacy Consult for Electrolyte Monitoring  Patient Measurements: Height: 5\' 9"  (175.3 cm) Weight: 237 lb (107.502 kg) (upon transfer) IBW/kg (Calculated) : 70.7  Vital Signs: Temp: 98 F (36.7 C) (11/26 0553) Temp Source: Oral (11/25 2003) BP: 99/68 mmHg (11/26 0634) Pulse Rate: 90 (11/26 0634)    Labs:  Recent Labs  08/27/15 2317 08/28/15 0307 08/28/15 0920 08/28/15 1154 08/29/15 0330 08/30/15 0510  WBC 10.1 8.1 6.5  --  8.5  --   HGB 12.9* 12.1* 12.2*  --  12.0*  --   HCT 38.8* 36.2* 36.7*  --  35.9*  --   PLT 135* 118* 119*  --  113*  --   CREATININE 1.05 1.28* 1.14  --  1.34* 0.95  MG  --  1.7  --  1.7  --  1.7  PHOS  --   --   --   --   --  3.2  ALBUMIN 3.8  --   --   --   --   --   PROT 6.5  --   --   --   --   --   AST 24  --   --   --   --   --   ALT 18  --   --   --   --   --   ALKPHOS 48  --   --   --   --   --   BILITOT 0.7  --   --   --   --   --    Estimated Creatinine Clearance: 102.4 mL/min (by C-G formula based on Cr of 0.95).  Lab Results  Component Value Date   NA 136 08/30/2015   K 3.6 08/30/2015   CL 99* 08/30/2015   CO2 31 08/30/2015     Recent Labs  08/29/15 1208 08/29/15 1641 08/29/15 2157  GLUCAP 274* 235* 145*    Assessment: Pharmacy consulted for electrolyte monitoring and managing.  K+ today 2.9 and Mg low end of normal at 1.7   Plan:  Potassium 3.6 this morning, calcium 9, magnesium 1.7 and phosphorus 3.2. No supplementation needed at this point, will recheck in two days.  Laural Benes, Pharm.D., BCPS Clinical Pharmacist 08/30/2015,6:38 AM

## 2015-08-30 NOTE — Op Note (Signed)
Laparoscopic Cholecystectomy Procedure Note  Indications: This patient presents with symptomatic gallbladder disease and will undergo laparoscopic cholecystectomy. He had U/S and CT scan that showed large gallbladder stone, wall thickening and pericholecystic fluid  Pre-operative Diagnosis: Calculus of gallbladder with acute cholecystitis, without mention of obstruction  Post-operative Diagnosis: Same  Surgeon: Hubbard Robinson   Assistants: none   Anesthesia: General endotracheal anesthesia  ASA Class: 3  Procedure Details  The patient was seen again in the Holding Room. The risks, benefits, complications, treatment options, and expected outcomes were discussed with the patient. The possibilities of reaction to medication, pulmonary aspiration, perforation of viscus, bleeding, recurrent infection, finding a normal gallbladder, the need for additional procedures, failure to diagnose a condition, the possible need to convert to an open procedure, and creating a complication requiring transfusion or operation were discussed with the patient. The patient concurred with the proposed plan, giving informed consent.  The patient was taken to Operating Room, identified as Ronald Miller. and the procedure verified as Laparoscopic Cholecystectomy. A Time Out was held and the above information confirmed.  Prior to the induction of general anesthesia, antibiotic prophylaxis was administered. General endotracheal anesthesia was then administered and tolerated well. After the induction, the abdomen was prepped in the usual sterile fashion. The patient was positioned in the supine position with the left arm comfortably tucked, along with some reverse Trendelenburg.  Local anesthetic agent was injected into the skin near the umbilicus and an incision made. The midline fascia was incised and the Hasson technique was used to introduce a 10 mm port under direct vision. It was secured with two figure of eight  Vicryl suture placed in the usual fashion. Pneumoperitoneum was then created with CO2 and tolerated well without any adverse changes in the patient's vital signs. Additional trocars were introduced under direct vision. All skin incisions were infiltrated with a local anesthetic agent before making the incision and placing the trocars.   The gallbladder was identified, the fundus grasped and retracted cephalad. Adhesions were lysed bluntly and with the electrocautery where indicated, taking care not to injure any adjacent organs or viscus. The infundibulum was grasped and retracted laterally, exposing the peritoneum overlying the triangle of Calot. This was then divided and exposed in a blunt fashion. The cystic duct was clearly identified and bluntly dissected circumferentially. The junctions of the gallbladder, cystic duct and common bile duct were clearly identified prior to the division of any linear structure.  The cystic duct was then doubly ligated with surgical clips on the patient side and singly clipped on the gallbladder side and divided. The cystic artery was identified, dissected free, ligated with clips and divided as well.   The gallbladder was dissected from the liver bed in retrograde fashion with the electrocautery. The gallbladder was removed. The liver bed was irrigated and inspected. Hemostasis was achieved with the electrocautery. Copious irrigation was utilized and was repeatedly aspirated until clear all particulate matter.  Pneumoperitoneum was completely reduced after viewing removal of the trocars under direct vision. The wound was thoroughly irrigated and the fascia was then closed with previously placed figure of eight sutures; the skin was then closed with 4-0 Monocryl and a sterile glue was applied.  Instrument, sponge, and needle counts were correct at closure and at the conclusion of the case.   Findings: Cholecystitis with Cholelithiasis  Estimated Blood Loss: less  than 50 mL         Drains: none  Total IV Fluids: 1040mL         Specimens: Gallbladder           Complications: None; patient tolerated the procedure well.         Disposition: PACU - hemodynamically stable.         Condition: stable

## 2015-08-30 NOTE — Anesthesia Preprocedure Evaluation (Addendum)
Anesthesia Evaluation  Patient identified by MRN, date of birth, ID band Patient awake    Reviewed: Allergy & Precautions, NPO status , Patient's Chart, lab work & pertinent test results, reviewed documented beta blocker date and time   Airway Mallampati: III  TM Distance: >3 FB     Dental  (+) Chipped, Poor Dentition, Edentulous Upper, Dental Advisory Given   Pulmonary COPD,           Cardiovascular hypertension, Pt. on medications and Pt. on home beta blockers + CAD and +CHF       Neuro/Psych PSYCHIATRIC DISORDERS Depression    GI/Hepatic   Endo/Other  diabetes, Type 2  Renal/GU      Musculoskeletal   Abdominal   Peds  Hematology   Anesthesia Other Findings Obesity. Suicidal? CABG 2001. Defib. Elevated troponin - discussed with Lof.  Reproductive/Obstetrics                            Anesthesia Physical Anesthesia Plan  ASA: III  Anesthesia Plan: General   Post-op Pain Management:    Induction: Intravenous  Airway Management Planned: Oral ETT  Additional Equipment:   Intra-op Plan:   Post-operative Plan:   Informed Consent: I have reviewed the patients History and Physical, chart, labs and discussed the procedure including the risks, benefits and alternatives for the proposed anesthesia with the patient or authorized representative who has indicated his/her understanding and acceptance.     Plan Discussed with: CRNA  Anesthesia Plan Comments:         Anesthesia Quick Evaluation

## 2015-08-31 DIAGNOSIS — K8010 Calculus of gallbladder with chronic cholecystitis without obstruction: Secondary | ICD-10-CM

## 2015-08-31 LAB — BASIC METABOLIC PANEL
Anion gap: 9 (ref 5–15)
BUN: 35 mg/dL — AB (ref 6–20)
CHLORIDE: 94 mmol/L — AB (ref 101–111)
CO2: 27 mmol/L (ref 22–32)
CREATININE: 1.51 mg/dL — AB (ref 0.61–1.24)
Calcium: 8.3 mg/dL — ABNORMAL LOW (ref 8.9–10.3)
GFR calc Af Amer: 57 mL/min — ABNORMAL LOW (ref >60)
GFR calc non Af Amer: 49 mL/min — ABNORMAL LOW (ref >60)
Glucose, Bld: 286 mg/dL — ABNORMAL HIGH (ref 65–99)
Potassium: 4.1 mmol/L (ref 3.5–5.1)
SODIUM: 130 mmol/L — AB (ref 135–145)

## 2015-08-31 LAB — CBC
HEMATOCRIT: 34.4 % — AB (ref 40.0–52.0)
HEMOGLOBIN: 11.4 g/dL — AB (ref 13.0–18.0)
MCH: 30.7 pg (ref 26.0–34.0)
MCHC: 33.2 g/dL (ref 32.0–36.0)
MCV: 92.4 fL (ref 80.0–100.0)
Platelets: 111 10*3/uL — ABNORMAL LOW (ref 150–440)
RBC: 3.72 MIL/uL — ABNORMAL LOW (ref 4.40–5.90)
RDW: 13.7 % (ref 11.5–14.5)
WBC: 6.9 10*3/uL (ref 3.8–10.6)

## 2015-08-31 LAB — GLUCOSE, CAPILLARY
GLUCOSE-CAPILLARY: 304 mg/dL — AB (ref 65–99)
GLUCOSE-CAPILLARY: 252 mg/dL — AB (ref 65–99)
Glucose-Capillary: 197 mg/dL — ABNORMAL HIGH (ref 65–99)

## 2015-08-31 MED ORDER — INSULIN DETEMIR 100 UNIT/ML ~~LOC~~ SOLN
10.0000 [IU] | Freq: Every day | SUBCUTANEOUS | Status: DC
  Administered 2015-08-31: 10 [IU] via SUBCUTANEOUS
  Filled 2015-08-31 (×2): qty 0.1

## 2015-08-31 MED ORDER — INSULIN ASPART 100 UNIT/ML ~~LOC~~ SOLN
5.0000 [IU] | Freq: Three times a day (TID) | SUBCUTANEOUS | Status: DC
  Administered 2015-08-31 – 2015-09-01 (×4): 5 [IU] via SUBCUTANEOUS
  Filled 2015-08-31 (×4): qty 5

## 2015-08-31 MED ORDER — SODIUM CHLORIDE 0.9 % IV SOLN
INTRAVENOUS | Status: AC
  Administered 2015-08-31 – 2015-09-01 (×2): via INTRAVENOUS

## 2015-08-31 NOTE — Progress Notes (Signed)
[facility] [facility] [place] NAME: Ronald Miller    MR#:  [id number]  DATE OF BIRTH:  04-Mar-1956  SUBJECTIVE:  CHIEF COMPLAINT:   Chief Complaint  Patient presents with  . Pacemaker Problem  . Cough  still having RUQ pain. blood pressure in 100s  REVIEW OF SYSTEMS:  Review of Systems  Constitutional: Negative for fever, weight loss, malaise/fatigue and diaphoresis.  HENT: Negative for ear discharge, ear pain, hearing loss, nosebleeds, sore throat and tinnitus.   Eyes: Negative for blurred vision and pain.  Respiratory: Negative for cough, hemoptysis, shortness of breath and wheezing.   Cardiovascular: Negative for chest pain, palpitations, orthopnea and leg swelling.  Gastrointestinal: Negative for heartburn, nausea, vomiting, abdominal pain, diarrhea, constipation and blood in stool.  Genitourinary: Negative for dysuria, urgency and frequency.  Musculoskeletal: Negative for myalgias and back pain.  Skin: Negative for itching and rash.  Neurological: Negative for dizziness, tingling, tremors, focal weakness, seizures, weakness and headaches.  Psychiatric/Behavioral: Negative for depression. The patient is not nervous/anxious.    DRUG ALLERGIES:   Allergies  Allergen Reactions  . Codeine Nausea And Vomiting   VITALS:  Blood pressure 110/66, pulse 82, temperature 98.7 F (37.1 C), temperature source Oral, resp. rate 20, height 5\' 9"  (1.753 m), weight 107.049 kg (236 lb), SpO2 97 %. PHYSICAL EXAMINATION:  Physical Exam  Constitutional: He is oriented to person, place, and time and well-developed, well-nourished, and in no distress.  HENT:  Head: Normocephalic and atraumatic.  Eyes: Conjunctivae and EOM are normal. Pupils are equal, round, and reactive to light.  Neck: Normal range of motion. Neck supple. No tracheal deviation present. No thyromegaly present.  Cardiovascular: Normal rate, regular rhythm and normal heart sounds.    Pulmonary/Chest: Effort normal and breath sounds normal. No respiratory distress. He has no wheezes. He exhibits no tenderness.  Abdominal: Soft. Bowel sounds are normal. He exhibits no distension. There is no tenderness.  Appropriately tender surgical site, incision clean, DRESSING INTACT  Musculoskeletal: Normal range of motion.  Neurological: He is alert and oriented to person, place, and time. No cranial nerve deficit.  Skin: Skin is warm and dry. No rash noted.  Psychiatric: Mood and affect normal.   LABORATORY PANEL:   CBC  Recent Labs Lab 08/31/15 0501  WBC 6.9  HGB 11.4*  HCT 34.4*  PLT 111*   ------------------------------------------------------------------------------------------------------------------ Chemistries   Recent Labs Lab 08/27/15 2317  08/30/15 0510 08/31/15 0501  NA 137  < > 136 130*  K 2.6*  < > 3.6 4.1  CL 99*  < > 99* 94*  CO2 30  < > 31 27  GLUCOSE 122*  < > 162* 286*  BUN 23*  < > 29* 35*  CREATININE 1.05  < > 0.95 1.51*  CALCIUM 8.4*  < > 9.0 8.3*  MG  --   < > 1.7  --   AST 24  --   --   --   ALT 18  --   --   --   ALKPHOS 48  --   --   --   BILITOT 0.7  --   --   --   < > = values in this interval not displayed. RADIOLOGY:  No results found. ASSESSMENT AND PLAN:  59 year old male with known chronic systolic dysfunction congestive heart failure coronary artery disease diabetes with complication essential hypertension , admitted after AICD firing  * Acute cholecystitis and gall stones. S/p cholecystectomy  POD 1. Further mgmt per surgery  * Uncontrolled Type 2 diabetes mellitus ([place]) - continue Insulin sliding scale and metformin. Add levemir and meal coverage as blood sugars anywhere from 250-400s  * Hyponatremia: start NS ivf and monitor  * Severe hypokalemia: Replete and recheck  * ARF: likely prerenal, creat 1.5, will start ivf and monitor kidney function  * Chest pain - Appreciate cardiology input. AICD interrogation per  Cardio - likely outpatient  * Elevated troponin - due to supply demand ischemia in the setting of chronic systolic heart failure, or potentially AICD firing. Monitor him on Tele.  * AICD discharge - d/w cardiology for AICD interrogation - likely outpatient  * Chronic systolic heart failure (HCC) - Hold lasix, Aldactone, Metoprolol, Lotensin due to Hypotension  * COPD (chronic obstructive pulmonary disease) ([place]) - continue azithromycin, DuoNeb's when necessary.  * Depression - continue LEXAPRO   Can transfer him back to telemetry.  Will ambulate and make sure  He is not symptomatic.  All the records are reviewed and case discussed with Care Management/Social Worker. Management plans discussed with the patient, Dr Azalee Course and he is in agreement.  CODE STATUS: Full Code  TOTAL TIME TAKING CARE OF THIS PATIENT: 35 minutes.   More than 50% of the time was spent in counseling/coordination of care: YES  POSSIBLE D/C IN AM, DEPENDING ON CLINICAL CONDITION. If pain, BLOOD SUGAR better controlled and tolerates diet   [facility], Olusegun Gerstenberger M.D on 08/31/2015 at 1:30 PM  Between 7am to 6pm - Pager - [phone]  After 6pm go to [email or web address] - password [email or web address] [facility]  [facility]  Office  [phone]  CC: Primary care physician; Placido Sou, MD

## 2015-08-31 NOTE — Progress Notes (Signed)
59 yr old male POD#1 from Lap chole for chronic cholecystitis.  Patient doing well today.  States some soreness in abdomen but pain medication helping.  He tolerated diet today without any issues and is up and sitting in the chair.    Filed Vitals:   08/31/15 0727 08/31/15 1226  BP: 111/77 110/66  Pulse: 76 82  Temp:  98.7 F (37.1 C)  Resp: 18 20   PE;  Gen: NAD, sitting up in chair Abd: soft, nd, incision sites c/d/i without erythema or edema Ext: 2+ pulses, no edema  A/P: POD#1 from lap chole Doing well postop, continue carb modified diet, encourage ambulation, percocet for pain control.

## 2015-08-31 NOTE — Progress Notes (Signed)
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 V paced. Room air. FS are stable. Pt reported pain and received pain meds. A& O. Ambulating in the room and tolerated it well. Lap sites are dry and intact. Urinal. Pt has no further concerns at this time.

## 2015-08-31 NOTE — Anesthesia Postprocedure Evaluation (Signed)
Anesthesia Post Note  Patient: Ronald Miller.  Procedure(s) Performed: Procedure(s) (LRB): LAPAROSCOPIC CHOLECYSTECTOMY (N/A)  Patient location during evaluation: PACU Anesthesia Type: General Level of consciousness: awake and alert Pain management: pain level controlled Vital Signs Assessment: post-procedure vital signs reviewed and stable Respiratory status: spontaneous breathing, nonlabored ventilation, respiratory function stable and patient connected to nasal cannula oxygen Cardiovascular status: blood pressure returned to baseline and stable Postop Assessment: No signs of nausea or vomiting Anesthetic complications: no    Last Vitals:  Filed Vitals:   08/31/15 0727 08/31/15 1226  BP: 111/77 110/66  Pulse: 76 82  Temp:  37.1 C  Resp: 18 20    Last Pain:  Filed Vitals:   08/31/15 1226  PainSc: 0-No pain                 Chaya Dehaan S

## 2015-08-31 NOTE — Consult Note (Signed)
PULMONARY / CRITICAL CARE MEDICINE   Name: Ronald Miller. MRN: [id number] DOB: [birth year]    ADMISSION DATE:  08/27/2015 CONSULTATION DATE:  08/28/15  REFERRING MD :  Dr. Max Sane   CHIEF COMPLAINT:   Low blood pressure, cough   HISTORY OF PRESENT ILLNESS    59 y.o. male who presents with chest pain after AICD discharge. Patient states that the AICD discharge woke up from his sleep. Since that time he has had chest discomfort which he describes as chest tightness located central chest area. He does not identify this chest pain is exertional, and denies any other associated symptoms including radiation, nausea/vomiting, dizziness, diaphoresis. Patient states that he has a history of chronic systolic congestive heart failure, but that he recognizes symptoms of heart failure exacerbation and does not feel like that is what is going on at this time. He does have a history of COPD as well, and states that he has had a significant cough for the last 2 months which just does not seem to want to improve. He occasionally coughs up some thick whitish sputum. He states that he does have fits of coughing and when she will almost lose consciousness due to such forceful coughing. He was admitted to the medical floor, but noted to have continued systolic blood pressure in the 80s, which is unusual for him. He will low systolic blood pressure he was is mentate well, given the low systolic blood pressure and a history of heart failure he was transferred to the ICU for closer monitoring. In the ICU he is mentating well, his MAP is in the 70s by cuff pressure, he does have slight lightheadedness. He states that his blood pressure usually runs in the 110s.  SUBJECTIVE: Patient doing well this morning, with improved breathing after lap chole.   SIGNIFICANT EVENTS   11/26>> CT abdomen and pelvis with possible acute cholecystitis  PAST MEDICAL HISTORY    :  Past Medical History  Diagnosis Date  .  ASCVD (arteriosclerotic cardiovascular disease)   . Diabetes mellitus   . Hypertension   . History of cocaine abuse   . Tobacco abuse   . Depression   . Suicidal ideation   . Chronic systolic CHF (congestive heart failure) ([place])   . COPD (chronic obstructive pulmonary disease) ([place])   . Coronary artery disease    Past Surgical History  Procedure Laterality Date  . Coronary artery bypass graft  2001  . Lumbar disc surgery  02/1999    Discectomy and fusion  . Vasectomy      Subsequent reversal  . Insert / replace / remove pacemaker    . Cardiac defibrillator placement     Prior to Admission medications   Medication Sig Start Date End Date Taking? Authorizing Provider  albuterol (PROVENTIL HFA;VENTOLIN HFA) 108 (90 BASE) MCG/ACT inhaler Inhale 2 puffs into the lungs every 6 (six) hours as needed for wheezing or shortness of breath.   Yes Historical Provider, MD  aspirin 325 MG tablet Take 325 mg by mouth daily.     Yes Historical Provider, MD  atorvastatin (LIPITOR) 20 MG tablet Take 20 mg by mouth every evening.   Yes Historical Provider, MD  baclofen (LIORESAL) 10 MG tablet Take 10 mg by mouth 3 (three) times daily.   Yes Historical Provider, MD  benazepril (LOTENSIN) 5 MG tablet Take 5 mg by mouth daily.   Yes Historical Provider, MD  escitalopram (LEXAPRO) 20 MG tablet Take 20 mg by mouth  daily.   Yes Historical Provider, MD  furosemide (LASIX) 40 MG tablet Take 40 mg by mouth 2 (two) times daily. Take an additional 40mg   In AM if weight over 4 lbs   Yes Historical Provider, MD  insulin aspart (NOVOLOG) 100 UNIT/ML injection Inject 10 Units into the skin 3 (three) times daily before meals.   Yes Historical Provider, MD  insulin glargine (LANTUS) 100 UNIT/ML injection Inject 50 Units into the skin at bedtime.   Yes Historical Provider, MD  metFORMIN (GLUCOPHAGE) 1000 MG tablet Take 1,000 mg by mouth 2 (two) times daily.   Yes Historical Provider, MD  metolazone (ZAROXOLYN) 5 MG  tablet Take 5 mg by mouth daily as needed (for 4-5 pound weight gain).   Yes Historical Provider, MD  metoprolol succinate (TOPROL-XL) 100 MG 24 hr tablet Take 100 mg by mouth daily. Take with or immediately following a meal.   Yes Historical Provider, MD  potassium chloride SA (K-DUR,KLOR-CON) 20 MEQ tablet Take 20 mEq by mouth daily.   Yes Historical Provider, MD  spironolactone (ALDACTONE) 50 MG tablet Take 50 mg by mouth daily.   Yes Historical Provider, MD   Allergies  Allergen Reactions  . Codeine Nausea And Vomiting     FAMILY HISTORY   Family History  Problem Relation Age of Onset  . CAD    . Diabetes        SOCIAL HISTORY    reports that he has been smoking Cigarettes.  He has a 34.5 pack-year smoking history. He has never used smokeless tobacco. He reports that he does not drink alcohol or use illicit drugs.  Review of Systems  Constitutional: Negative for fever, chills and weight loss.  HENT: Positive for sore throat. Negative for congestion.   Eyes: Negative for blurred vision, double vision and photophobia.  Respiratory: Positive for cough and shortness of breath. Negative for hemoptysis, sputum production and wheezing.   Gastrointestinal: Positive for abdominal pain. Negative for heartburn, nausea and vomiting.  Skin: Negative for itching and rash.  Neurological: Negative for dizziness and tingling.      VITAL SIGNS    Temp:  [97.8 F (36.6 C)-99.8 F (37.7 C)] 97.8 F (36.6 C) (11/27 0557) Pulse Rate:  [76-111] 76 (11/27 0727) Resp:  [14-20] 18 (11/27 0727) BP: (88-114)/(66-88) 111/77 mmHg (11/27 0727) SpO2:  [85 %-95 %] 95 % (11/27 0727) HEMODYNAMICS:   VENTILATOR SETTINGS:   INTAKE / OUTPUT:  Intake/Output Summary (Last 24 hours) at 08/31/15 1141 Last data filed at 08/31/15 0840  Gross per 24 hour  Intake    770 ml  Output    350 ml  Net    420 ml       PHYSICAL EXAM   Physical Exam  Constitutional: He is oriented to person, place,  and time. He appears well-developed and well-nourished.  HENT:  Head: Normocephalic and atraumatic.  Right Ear: External ear normal.  Left Ear: External ear normal.  Mouth/Throat: Oropharynx is clear and moist.  Eyes: Conjunctivae and EOM are normal. Pupils are equal, round, and reactive to light.  Neck: Normal range of motion. Neck supple.  Pulmonary/Chest: Effort normal and breath sounds normal.  Faint upper lobe wheezes - improved  Abdominal:  RUQ tenderness  Musculoskeletal:  Trace pedal edema  Neurological: He is alert and oriented to person, place, and time.  Skin: Skin is warm and dry.  Psychiatric: He has a normal mood and affect.  Nursing note and vitals reviewed.  LABS   LABS:  CBC  Recent Labs Lab 08/28/15 0920 08/29/15 0330 08/31/15 0501  WBC 6.5 8.5 6.9  HGB 12.2* 12.0* 11.4*  HCT 36.7* 35.9* 34.4*  PLT 119* 113* 111*   Coag's No results for input(s): APTT, INR in the last 168 hours. BMET  Recent Labs Lab 08/29/15 0330 08/30/15 0510 08/31/15 0501  NA 133* 136 130*  K 3.5 3.6 4.1  CL 97* 99* 94*  CO2 29 31 27   BUN 39* 29* 35*  CREATININE 1.34* 0.95 1.51*  GLUCOSE 194* 162* 286*   Electrolytes  Recent Labs Lab 08/28/15 0307  08/28/15 1154 08/29/15 0330 08/30/15 0510 08/31/15 0501  CALCIUM  --   < >  --  8.3* 9.0 8.3*  MG 1.7  --  1.7  --  1.7  --   PHOS  --   --   --   --  3.2  --   < > = values in this interval not displayed. Sepsis Markers No results for input(s): LATICACIDVEN, PROCALCITON, O2SATVEN in the last 168 hours. ABG No results for input(s): PHART, PCO2ART, PO2ART in the last 168 hours. Liver Enzymes  Recent Labs Lab 08/27/15 2317  AST 24  ALT 18  ALKPHOS 48  BILITOT 0.7  ALBUMIN 3.8   Cardiac Enzymes  Recent Labs Lab 08/28/15 0307 08/28/15 0920 08/28/15 1154  TROPONINI 0.07* 0.06* 0.05*   Glucose  Recent Labs Lab 08/29/15 2157 08/30/15 0727 08/30/15 1146 08/30/15 1644 08/30/15 2040  08/31/15 0733  GLUCAP 145* 152* 151* 204* 403* 304*     Recent Results (from the past 240 hour(s))  MRSA PCR Screening     Status: Abnormal   Collection Time: 08/28/15 10:41 AM  Result Value Ref Range Status   MRSA by PCR POSITIVE (A) NEGATIVE Final    Comment:        The GeneXpert MRSA Assay (FDA approved for NASAL specimens only), is one component of a comprehensive MRSA colonization surveillance program. It is not intended to diagnose MRSA infection nor to guide or monitor treatment for MRSA infections. CRITICAL RESULT CALLED TO, READ BACK BY AND VERIFIED WITH: Aldona Bar ROUSE AT 1229 08/28/15 DAS   Culture, blood (routine x 2)     Status: None (Preliminary result)   Collection Time: 08/28/15 11:55 AM  Result Value Ref Range Status   Specimen Description BLOOD RIGHT AC  Final   Special Requests   Final    BOTTLES DRAWN AEROBIC AND ANAEROBIC  AER 2CC ANA 3CC   Culture NO GROWTH 3 DAYS  Final   Report Status PENDING  Incomplete  Culture, blood (routine x 2)     Status: None (Preliminary result)   Collection Time: 08/28/15 12:07 PM  Result Value Ref Range Status   Specimen Description BLOOD LEFT AC  Final   Special Requests   Final    BOTTLES DRAWN AEROBIC AND ANAEROBIC  AER 1CC ANA 3CC   Culture NO GROWTH 3 DAYS  Final   Report Status PENDING  Incomplete     Current facility-administered medications:  .  acetaminophen (TYLENOL) tablet 650 mg, 650 mg, Oral, Q6H PRN **OR** acetaminophen (TYLENOL) suppository 650 mg, 650 mg, Rectal, Q6H PRN, Lance Coon, MD .  ALPRAZolam Duanne Moron) tablet 0.25 mg, 0.25 mg, Oral, TID PRN, Laverle Hobby, MD, 0.25 mg at 08/28/15 1619 .  aspirin tablet 325 mg, 325 mg, Oral, Daily, Lance Coon, MD, 325 mg at 08/31/15 0914 .  atorvastatin (LIPITOR) tablet 20 mg, 20 mg,  Oral, QPM, Lance Coon, MD, 20 mg at 08/30/15 1720 .  cefOXitin (MEFOXIN) 2 g in dextrose 5 % 50 mL IVPB, 2 g, Intravenous, 4 times per day, Hubbard Robinson, MD, 2 g at  08/31/15 0600 .  Chlorhexidine Gluconate Cloth 2 % PADS 6 each, 6 each, Topical, Q0600, Hubbard Robinson, MD, 6 each at 08/31/15 0600 .  doxycycline (VIBRA-TABS) tablet 100 mg, 100 mg, Oral, Q12H, Alysha Doolan, MD, 100 mg at 08/31/15 0914 .  enoxaparin (LOVENOX) injection 40 mg, 40 mg, Subcutaneous, QHS, Lance Coon, MD, 40 mg at 08/30/15 2218 .  escitalopram (LEXAPRO) tablet 20 mg, 20 mg, Oral, Daily, Lance Coon, MD, 20 mg at 08/31/15 0914 .  fentaNYL (SUBLIMAZE) injection 25 mcg, 25 mcg, Intravenous, Q5 min PRN, Gunnar Bulla, MD .  guaiFENesin (ROBITUSSIN) 100 MG/5ML solution 100 mg, 5 mL, Oral, Q6H PRN, Vaughan Basta, MD, 100 mg at 08/29/15 2203 .  insulin aspart (novoLOG) injection 0-9 Units, 0-9 Units, Subcutaneous, TID AC & HS, Lance Coon, MD, 7 Units at 08/31/15 0914 .  insulin aspart (novoLOG) injection 5 Units, 5 Units, Subcutaneous, TID WC, Vipul Shah, MD .  insulin detemir (LEVEMIR) injection 10 Units, 10 Units, Subcutaneous, QHS, Vipul Shah, MD .  ipratropium-albuterol (DUONEB) 0.5-2.5 (3) MG/3ML nebulizer solution 3 mL, 3 mL, Nebulization, Q6H PRN, Vilinda Boehringer, MD .  Derrill Memo ON 09/01/2015] metFORMIN (GLUCOPHAGE) tablet 1,000 mg, 1,000 mg, Oral, BID WC, Vipul Shah, MD .  mometasone-formoterol (DULERA) 200-5 MCG/ACT inhaler 2 puff, 2 puff, Inhalation, BID, Vilinda Boehringer, MD, 2 puff at 08/31/15 0914 .  morphine 2 MG/ML injection 1 mg, 1 mg, Intravenous, Q4H PRN, Hubbard Robinson, MD, 1 mg at 08/30/15 1628 .  mupirocin ointment (BACTROBAN) 2 % 1 application, 1 application, Nasal, BID, Max Sane, MD, 1 application at [id number] .  ondansetron (ZOFRAN) tablet 4 mg, 4 mg, Oral, Q6H PRN **OR** ondansetron (ZOFRAN) injection 4 mg, 4 mg, Intravenous, Q6H PRN, Lance Coon, MD .  ondansetron [facility]) injection 4 mg, 4 mg, Intravenous, Once PRN, Gunnar Bulla, MD .  oxyCODONE-acetaminophen (PERCOCET) 7.5-325 MG per tablet 1-2 tablet, 1-2 tablet, Oral, Q4H PRN, Hubbard Robinson, MD, 2 tablet at 08/31/15 0914 .  sodium chloride 0.9 % injection 3 mL, 3 mL, Intravenous, Q12H, Lance Coon, MD, 3 mL at 08/31/15 1000 .  zolpidem (AMBIEN) tablet 10 mg, 10 mg, Oral, QHS PRN, Laverle Hobby, MD, 10 mg at 08/31/15 0229  IMAGING    No results found.    Indwelling Urinary Catheter continued, requirement due to   Reason to continue Indwelling Urinary Catheter for strict Intake/Output monitoring for hemodynamic instability   Central Line continued, requirement due to   Reason to continue [employer] Monitoring of central venous pressure or other hemodynamic parameters   Ventilator continued, requirement due to, resp failure    Ventilator Sedation RASS 0 to -2   Cultures: BCx2  UC  Sputum  Antibiotics: Doxycycline 11/24>>  Lines:   ASSESSMENT/PLAN  59 year old male with chronic systolic heart failure, coronary artery disease, essential hypertension, status post defibrillator, had some hypotension requiring short stay in ICU, now with acute cholecystitis.  PULMONARY Mild shortness of breath, cough Perioperative pulmonary clearance -Chest x-ray with no infiltrates or vascular congestion, no signs of fluid overload or pulmonary edema -Remote history of COPD -Only on as needed albuterol at home -Continue with observation, shortness of breath increases may consider adding Pulmicort nebulizers -He does have some faint upper lobe wheezes, will schedule duo  nebs for one day and then continue as needed -Continue with doxycycline 1 tab by mouth twice a day 10 days, start Dulera 200 - patient with known COPD, not on any inhalers.  -Patient at moderate risk for postoperative pulmonary, location such as prolonged need for ventilator, positive pressure initiation. Recommend nebulizer treatment prior to surgical intervention and if able to extubate, then extubate to CPAP or nasal cannula. - D\C with nicotine patches.  - patient can follow up with [facility] in 2-3 weeks  CARDIOVASCULAR Systolic heart failure status post defibrillator Coronary artery disease Essential Hypertension Hypotension - resolving - most likely due to cholecystitis -Monitor blood pressure closely, improving today -Systolic blood pressure is currently soft, not sure he'll be able to tolerate Lasix or beta blockers.  Cardiology following -Unknown reason for AICD discharge, his troponins are mildly elevated, we'll trend out    RENAL -Monitor I's and O's and urine output -Monitor BMP  GASTROINTESTINAL  Cholecystitis - s\p lap chole - doing well POD #1 -PPI  Thank you for consulting [facility], we will signoff at this time.  Please feel free to contacts [place] with any questions.    Pulmonary consult time devoted to patient care services described in this note is 45 minutes.    Vilinda Boehringer, MD [facility] Pager [phone] (please enter 7-digits) On Call Pager [phone] (please enter 7-digits)     08/31/2015, 11:41 AM  Note: This note was prepared with Dragon dictation along with smaller phrase technology. Any transcriptional errors that result from this process are unintentional.

## 2015-09-01 ENCOUNTER — Encounter: Payer: Self-pay | Admitting: Surgery

## 2015-09-01 LAB — CBC
HCT: 33.2 % — ABNORMAL LOW (ref 40.0–52.0)
HEMOGLOBIN: 11 g/dL — AB (ref 13.0–18.0)
MCH: 30.8 pg (ref 26.0–34.0)
MCHC: 33.3 g/dL (ref 32.0–36.0)
MCV: 92.4 fL (ref 80.0–100.0)
Platelets: 109 10*3/uL — ABNORMAL LOW (ref 150–440)
RBC: 3.59 MIL/uL — AB (ref 4.40–5.90)
RDW: 13.8 % (ref 11.5–14.5)
WBC: 8.5 10*3/uL (ref 3.8–10.6)

## 2015-09-01 LAB — BASIC METABOLIC PANEL
ANION GAP: 8 (ref 5–15)
Anion gap: 6 (ref 5–15)
BUN: 37 mg/dL — AB (ref 6–20)
BUN: 38 mg/dL — AB (ref 6–20)
CHLORIDE: 103 mmol/L (ref 101–111)
CHLORIDE: 95 mmol/L — AB (ref 101–111)
CO2: 25 mmol/L (ref 22–32)
CO2: 30 mmol/L (ref 22–32)
Calcium: 7.4 mg/dL — ABNORMAL LOW (ref 8.9–10.3)
Calcium: 8.6 mg/dL — ABNORMAL LOW (ref 8.9–10.3)
Creatinine, Ser: 2.29 mg/dL — ABNORMAL HIGH (ref 0.61–1.24)
Creatinine, Ser: 1.08 mg/dL (ref 0.61–1.24)
GFR calc Af Amer: 34 mL/min — ABNORMAL LOW (ref >60)
GFR calc non Af Amer: 30 mL/min — ABNORMAL LOW (ref >60)
GLUCOSE: 217 mg/dL — AB (ref 65–99)
GLUCOSE: 267 mg/dL — AB (ref 65–99)
POTASSIUM: 3.5 mmol/L (ref 3.5–5.1)
POTASSIUM: 3.9 mmol/L (ref 3.5–5.1)
SODIUM: 134 mmol/L — AB (ref 135–145)
SODIUM: 133 mmol/L — AB (ref 135–145)

## 2015-09-01 LAB — PHOSPHORUS: Phosphorus: 3.2 mg/dL (ref 2.5–4.6)

## 2015-09-01 LAB — GLUCOSE, CAPILLARY
GLUCOSE-CAPILLARY: 254 mg/dL — AB (ref 65–99)
Glucose-Capillary: 307 mg/dL — ABNORMAL HIGH (ref 65–99)

## 2015-09-01 LAB — MAGNESIUM: MAGNESIUM: 1.6 mg/dL — AB (ref 1.7–2.4)

## 2015-09-01 MED ORDER — OXYCODONE-ACETAMINOPHEN 7.5-325 MG PO TABS: 1.0000 | ORAL_TABLET | Freq: Three times a day (TID) | ORAL | Status: DC | PRN

## 2015-09-01 NOTE — Discharge Instructions (Signed)
° °

## 2015-09-01 NOTE — Progress Notes (Signed)
[facility] [place] NAME: Ronald Miller    MR#:  [id number]  DATE OF BIRTH:  04/07/1956  SUBJECTIVE:  CHIEF COMPLAINT:   Chief Complaint  Patient presents with  . Pacemaker Problem  . Cough  sugars up, renal function worsening,. Creat up to 2.29, sitting in chair, pain controlled on current regimen  REVIEW OF SYSTEMS:  Review of Systems  Constitutional: Negative for fever, weight loss, malaise/fatigue and diaphoresis.  HENT: Negative for ear discharge, ear pain, hearing loss, nosebleeds, sore throat and tinnitus.   Eyes: Negative for blurred vision and pain.  Respiratory: Negative for cough, hemoptysis, shortness of breath and wheezing.   Cardiovascular: Negative for chest pain, palpitations, orthopnea and leg swelling.  Gastrointestinal: Negative for heartburn, nausea, vomiting, abdominal pain, diarrhea, constipation and blood in stool.  Genitourinary: Negative for dysuria, urgency and frequency.  Musculoskeletal: Negative for myalgias and back pain.  Skin: Negative for itching and rash.  Neurological: Negative for dizziness, tingling, tremors, focal weakness, seizures, weakness and headaches.  Psychiatric/Behavioral: Negative for depression. The patient is not nervous/anxious.    DRUG ALLERGIES:   Allergies  Allergen Reactions  . Codeine Nausea And Vomiting   VITALS:  Blood pressure 100/66, pulse 82, temperature 97.6 F (36.4 C), temperature source Oral, resp. rate 17, height 5\' 9"  (1.753 m), weight 111.857 kg (246 lb 9.6 oz), SpO2 97 %. PHYSICAL EXAMINATION:  Physical Exam  Constitutional: He is oriented to person, place, and time and well-developed, well-nourished, and in no distress.  HENT:  Head: Normocephalic and atraumatic.  Eyes: Conjunctivae and EOM are normal. Pupils are equal, round, and reactive to light.  Neck: Normal range of motion. Neck supple. No tracheal deviation present. No thyromegaly present.   Cardiovascular: Normal rate, regular rhythm and normal heart sounds.   Pulmonary/Chest: Effort normal and breath sounds normal. No respiratory distress. He has no wheezes. He exhibits no tenderness.  Abdominal: Soft. Bowel sounds are normal. He exhibits no distension. There is no tenderness.  Appropriately tender surgical site, incision clean, DRESSING INTACT  Musculoskeletal: Normal range of motion.  Neurological: He is alert and oriented to person, place, and time. No cranial nerve deficit.  Skin: Skin is warm and dry. No rash noted.  Psychiatric: Mood and affect normal.   LABORATORY PANEL:   CBC  Recent Labs Lab 09/01/15 0506  WBC 8.5  HGB 11.0*  HCT 33.2*  PLT 109*   ------------------------------------------------------------------------------------------------------------------ Chemistries   Recent Labs Lab 08/27/15 2317  09/01/15 0506 09/01/15 1156  NA 137  < > 134* 133*  K 2.6*  < > 3.5 3.9  CL 99*  < > 103 95*  CO2 30  < > 25 30  GLUCOSE 122*  < > 217* 267*  BUN 23*  < > 37* 38*  CREATININE 1.05  < > 2.29* 1.08  CALCIUM 8.4*  < > 7.4* 8.6*  MG  --   < > 1.6*  --   AST 24  --   --   --   ALT 18  --   --   --   ALKPHOS 48  --   --   --   BILITOT 0.7  --   --   --   < > = values in this interval not displayed. RADIOLOGY:  No results found. ASSESSMENT AND PLAN:  59 year old male with known chronic systolic dysfunction congestive heart failure coronary artery disease diabetes with complication essential hypertension , admitted after  AICD firing  * ARF: likely prerenal, initial morning labs showed creat 2.29 - recheck 1.08  * Acute cholecystitis and gall stones. S/p cholecystectomy POD 2. Further mgmt per surgery  * Uncontrolled Type 2 diabetes mellitus ([place]) - continue Insulin sliding scale and metformin. Add levemir and meal coverage as blood sugars anywhere from 250-400s  * Hyponatremia: start NS ivf and monitor  * Severe hypokalemia: Replete and  recheck  * ARF: likely prerenal, creat 1.5, will start ivf and monitor kidney function  * Chest pain - Appreciate cardiology input. AICD interrogation per Cardio - likely outpatient  * Elevated troponin - due to supply demand ischemia in the setting of chronic systolic heart failure, or potentially AICD firing. Monitor him on Tele.  * AICD discharge - d/w cardiology for AICD interrogation - likely outpatient  * Chronic systolic heart failure (HCC) - Hold lasix, Aldactone, Metoprolol, Lotensin due to Hypotension  * COPD (chronic obstructive pulmonary disease) ([name]) - continue azithromycin, DuoNeb's when necessary.  * Depression - continue LEXAPRO   D/C today  All the records are reviewed and case discussed with Care Management/Social Worker. Management plans discussed with the patient, Dr Azalee Course and he is in agreement.  CODE STATUS: Full Code  TOTAL TIME TAKING CARE OF THIS PATIENT: 35 minutes.   More than 50% of the time was spent in counseling/coordination of care: YES  POSSIBLE D/C TODAY   [facility] Roth Ress M.D on 09/01/2015 at 1:47 PM  Between 7am to 6pm - Pager - [phone]  After 6pm go to [email or web address] - password [email or web address] [facility]  [facility]  Office  [phone]  CC: Primary care physician; Placido Sou, MD

## 2015-09-01 NOTE — Progress Notes (Signed)
Patient discharged home via wheelchair, family at side. IV discontinued without incident. Discharge information given. Follow-up appointments reviewed. No questions verbalized.

## 2015-09-01 NOTE — Progress Notes (Signed)
Inpatient Diabetes Program Recommendations  AACE/ADA: New Consensus Statement on Inpatient Glycemic Control (2015)  Target Ranges:  Prepandial:   less than 140 mg/dL      Peak postprandial:   less than 180 mg/dL (1-2 hours)      Critically ill patients:  140 - 180 mg/dL  Results for CLEMENTS, OHAYON (MRN [id number]) as of 09/01/2015 08:46  Ref. Range 08/31/2015 07:33 08/31/2015 12:23 08/31/2015 21:34 09/01/2015 07:33  Glucose-Capillary Latest Ref Range: 65-99 mg/dL 304 (H) 252 (H) 197 (H) 254 (H)   Review of Glycemic Control  Diabetes history: DM2 Outpatient Diabetes medications: Lantus 50 units QHS, Novolog 10 units TID with meals, Metformin 1000 mg BID Current orders for Inpatient glycemic control: Levemir10 units QHS, Novolog 0-9 units ACHS, Novolog 5 units TID with meals, Metformin 1000 mg BID  Inpatient Diabetes Program Recommendations: Insulin - Basal: Please consider increasing Lantus to 15 units QHS.  Thanks, Barnie Alderman, RN, MSN, CDE Diabetes Coordinator Inpatient Diabetes Program [phone] (Team Pager from [place] to [place]) [phone] ([facility] office) [phone] [facility] office) [phone] [facility] office)

## 2015-09-01 NOTE — Progress Notes (Signed)
2 Days Post-Op   Subjective:  59 year old male 2 days status post laparoscopic cholecystectomy. Patient states that he is having some soreness from his surgical sites however pain is much improved than before surgery. He is tolerating a diet and having some bowel function. Denies any fevers or chills.  Vital signs in last 24 hours: Temp:  [97.5 F (36.4 C)-98.7 F (37.1 C)] 97.5 F (36.4 C) (11/28 0520) Pulse Rate:  [78-85] 85 (11/28 0520) Resp:  [16-20] 18 (11/28 0520) BP: (101-110)/(66-78) 110/78 mmHg (11/28 0520) SpO2:  [92 %-97 %] 95 % (11/28 0520) Weight:  [111.857 kg (246 lb 9.6 oz)] 111.857 kg (246 lb 9.6 oz) (11/28 0527) Last BM Date: 08/29/15  Intake/Output from previous day: 11/27 0701 - 11/28 0700 In: 150 [IV Piggyback:150] Out: 800 [Urine:800]  Physical exam: Gen.: Resting in bed in no acute distress Chest: Clear to auscultation Heart: Regular rate and rhythm GI: Abdomen is large, soft, minimally distended, well approximated laparoscopic cholecystectomy sites without any evidence of erythema or drainage. No peritoneal signs on exam.  Lab Results:  CBC  Recent Labs  08/31/15 0501 09/01/15 0506  WBC 6.9 8.5  HGB 11.4* 11.0*  HCT 34.4* 33.2*  PLT 111* 109*   CMP     Component Value Date/Time   NA 134* 09/01/2015 0506   NA 136 04/09/2014 0848   K 3.5 09/01/2015 0506   K 3.9 04/09/2014 0848   CL 103 09/01/2015 0506   CL 100 04/09/2014 0848   CO2 25 09/01/2015 0506   CO2 32 04/09/2014 0848   GLUCOSE 217* 09/01/2015 0506   GLUCOSE 191* 04/09/2014 0848   BUN 37* 09/01/2015 0506   BUN 12 04/09/2014 0848   CREATININE 2.29* 09/01/2015 0506   CREATININE 1.07 04/09/2014 0848   CALCIUM 7.4* 09/01/2015 0506   CALCIUM 8.9 04/09/2014 0848   PROT 6.5 08/27/2015 2317   PROT 7.3 03/29/2014 2127   ALBUMIN 3.8 08/27/2015 2317   ALBUMIN 3.9 03/29/2014 2127   AST 24 08/27/2015 2317   AST 24 03/29/2014 2127   ALT 18 08/27/2015 2317   ALT 40 03/29/2014 2127   ALKPHOS 48 08/27/2015 2317   ALKPHOS 82 03/29/2014 2127   BILITOT 0.7 08/27/2015 2317   BILITOT 0.4 03/29/2014 2127   GFRNONAA 30* 09/01/2015 0506   GFRNONAA >60 04/09/2014 0848   GFRAA 34* 09/01/2015 0506   GFRAA >60 04/09/2014 0848   PT/INR No results for input(s): LABPROT, INR in the last 72 hours.  Studies/Results: No results found.  Assessment/Plan: 59 year old male 2 days status post laparoscopic cholecystectomy. Doing well from a postsurgical standpoint. Patient also with multiple other medical problems being managed by the medicine department. Okay for discharge from a surgical standpoint. Will yield final disposition to the medicine service for the patient's other multiple medical problems. Surgery will follow as long as patient is inpatient.  Clayburn Pert, MD FACS General Surgeon [facility]

## 2015-09-01 NOTE — Care Management Important Message (Signed)
Important Message  Patient Details  Name: Ronald Miller. MRN: [id number] Date of Birth: [birth year]   Medicare Important Message Given:  Yes    Beverly Sessions, RN 09/01/2015, 10:23 AM

## 2015-09-01 NOTE — Progress Notes (Signed)
PT Cancellation Note  Patient Details Name: Ronald Miller. MRN: [id number] DOB: 04-22-1956   Cancelled Treatment:    Reason Eval/Treat Not Completed: Other (comment). Per chart review and discussion with RN, pt is at baseline level, does not require PT eval at this time. Will dc order.   Julisa Flippo 09/01/2015, 2:30 PM Greggory Stallion, PT, DPT [phone]

## 2015-09-01 NOTE — Progress Notes (Signed)
MEDICATION RELATED CONSULT NOTE - FOLLOW UP   Pharmacy Consult for Electrolyte Management   Allergies  Allergen Reactions  . Codeine Nausea And Vomiting    Patient Measurements: Height: 5\' 9"  (175.3 cm) Weight: 246 lb 9.6 oz (111.857 kg) IBW/kg (Calculated) : 70.7 Adjusted Body Weight: na  Vital Signs: Temp: 97.6 F (36.4 C) (11/28 1128) Temp Source: Oral (11/28 0520) BP: 100/66 mmHg (11/28 1128) Pulse Rate: 82 (11/28 1128) Intake/Output from previous day: 11/27 0701 - 11/28 0700 In: 150 [IV Piggyback:150] Out: 800 [Urine:800] Intake/Output from this shift:    Labs:  Recent Labs  08/30/15 0510 08/31/15 0501 09/01/15 0506 09/01/15 1156  WBC  --  6.9 8.5  --   HGB  --  11.4* 11.0*  --   HCT  --  34.4* 33.2*  --   PLT  --  111* 109*  --   CREATININE 0.95 1.51* 2.29* 1.08  MG 1.7  --  1.6*  --   PHOS 3.2  --  3.2  --    Estimated Creatinine Clearance: 92 mL/min (by C-G formula based on Cr of 1.08).   Microbiology: Recent Results (from the past 720 hour(s))  MRSA PCR Screening     Status: Abnormal   Collection Time: 08/28/15 10:41 AM  Result Value Ref Range Status   MRSA by PCR POSITIVE (A) NEGATIVE Final    Comment:        The GeneXpert MRSA Assay (FDA approved for NASAL specimens only), is one component of a comprehensive MRSA colonization surveillance program. It is not intended to diagnose MRSA infection nor to guide or monitor treatment for MRSA infections. CRITICAL RESULT CALLED TO, READ BACK BY AND VERIFIED WITH: SAMANTHA ROUSE AT 1229 08/28/15 DAS   Culture, blood (routine x 2)     Status: None (Preliminary result)   Collection Time: 08/28/15 11:55 AM  Result Value Ref Range Status   Specimen Description BLOOD RIGHT AC  Final   Special Requests   Final    BOTTLES DRAWN AEROBIC AND ANAEROBIC  AER 2CC ANA 3CC   Culture NO GROWTH 4 DAYS  Final   Report Status PENDING  Incomplete  Culture, blood (routine x 2)     Status: None (Preliminary  result)   Collection Time: 08/28/15 12:07 PM  Result Value Ref Range Status   Specimen Description BLOOD LEFT AC  Final   Special Requests   Final    BOTTLES DRAWN AEROBIC AND ANAEROBIC  AER 1CC ANA 3CC   Culture NO GROWTH 4 DAYS  Final   Report Status PENDING  Incomplete    Medications:  Scheduled:  . aspirin  325 mg Oral Daily  . atorvastatin  20 mg Oral QPM  . cefOXitin  2 g Intravenous 4 times per day  . doxycycline  100 mg Oral Q12H  . enoxaparin (LOVENOX) injection  40 mg Subcutaneous QHS  . escitalopram  20 mg Oral Daily  . insulin aspart  0-9 Units Subcutaneous TID AC & HS  . insulin aspart  5 Units Subcutaneous TID WC  . insulin detemir  10 Units Subcutaneous QHS  . mometasone-formoterol  2 puff Inhalation BID  . mupirocin ointment  1 application Nasal BID  . sodium chloride  3 mL Intravenous Q12H    Assessment: Pharmacy consulted for electrolyte management.  K=3.9  Goal of Therapy:  Maintain electrolytes WNL   Plan:  Will continue with current orders.  Paulina Fusi, PharmD, BCPS 09/01/2015 2:04 PM

## 2015-09-02 ENCOUNTER — Telehealth: Payer: Self-pay

## 2015-09-02 LAB — SURGICAL PATHOLOGY

## 2015-09-02 MED ORDER — FUROSEMIDE 40 MG PO TABS: 40.0000 mg | ORAL_TABLET | Freq: Two times a day (BID) | ORAL | Status: DC

## 2015-09-02 MED ORDER — ASPIRIN 325 MG PO TABS: 325.0000 mg | ORAL_TABLET | Freq: Every day | ORAL | Status: DC

## 2015-09-02 MED ORDER — INSULIN GLARGINE 100 UNIT/ML ~~LOC~~ SOLN: 50.0000 [IU] | Freq: Every day | SUBCUTANEOUS | Status: DC

## 2015-09-02 MED ORDER — BENAZEPRIL HCL 5 MG PO TABS: 5.0000 mg | ORAL_TABLET | Freq: Every day | ORAL | Status: DC

## 2015-09-02 MED ORDER — ESCITALOPRAM OXALATE 20 MG PO TABS: 20.0000 mg | ORAL_TABLET | Freq: Every day | ORAL | Status: DC

## 2015-09-02 MED ORDER — METFORMIN HCL 1000 MG PO TABS: 1000.0000 mg | ORAL_TABLET | Freq: Two times a day (BID) | ORAL | Status: DC

## 2015-09-02 MED ORDER — SPIRONOLACTONE 50 MG PO TABS: 50.0000 mg | ORAL_TABLET | Freq: Every day | ORAL | Status: DC

## 2015-09-02 MED ORDER — INSULIN ASPART 100 UNIT/ML ~~LOC~~ SOLN: 10.0000 [IU] | Freq: Three times a day (TID) | SUBCUTANEOUS | Status: DC

## 2015-09-02 MED ORDER — METOPROLOL SUCCINATE ER 100 MG PO TB24: 100.0000 mg | ORAL_TABLET | Freq: Every day | ORAL | Status: DC

## 2015-09-02 NOTE — Telephone Encounter (Signed)
Post discharge call to patient made at this time. Patient is going to pick up pain medication at this time. States that he has is very sore but otherwise, no complaints. Denies nausea, vomiting, fever, constipation, or diarrhea. No questions or concerns.   Post-op appointment scheduled with patient at this time. Encouraged patient to call with any questions that arise prior to appointment.

## 2015-09-03 LAB — CULTURE, BLOOD (ROUTINE X 2)
CULTURE: NO GROWTH
Culture: NO GROWTH

## 2015-09-10 ENCOUNTER — Encounter (INDEPENDENT_AMBULATORY_CARE_PROVIDER_SITE_OTHER): Payer: Commercial Managed Care - HMO | Admitting: General Surgery

## 2015-09-10 DIAGNOSIS — K8010 Calculus of gallbladder with chronic cholecystitis without obstruction: Secondary | ICD-10-CM

## 2015-09-10 NOTE — Progress Notes (Signed)
Chart opened in error Please disregard this enounter

## 2015-09-12 NOTE — Discharge Summary (Signed)
[facility] [employer] NAME: Ronald Miller    MR#:  [id number]  DATE OF BIRTH:  12-27-1955  DATE OF ADMISSION:  08/27/2015 ADMITTING PHYSICIAN: Lance Coon, MD  DATE OF DISCHARGE: 09/01/2015  4:46 PM  PRIMARY CARE PHYSICIAN: Placido Sou, MD    ADMISSION DIAGNOSIS:  pacemaker problem c  DISCHARGE DIAGNOSIS:  Principal Problem:   Chest pain Active Problems:   Type 2 diabetes mellitus (HCC)   Chronic systolic heart failure (HCC)   COPD (chronic obstructive pulmonary disease) (HCC)   HTN (hypertension)   Depression   Elevated troponin   AICD discharge   Hypokalemia   Hypotension   Chronic cholecystitis with calculus   SECONDARY DIAGNOSIS:   Past Medical History  Diagnosis Date  . ASCVD (arteriosclerotic cardiovascular disease)   . Diabetes mellitus   . Hypertension   . History of cocaine abuse   . Tobacco abuse   . Depression   . Suicidal ideation   . Chronic systolic CHF (congestive heart failure) ([place])   . COPD (chronic obstructive pulmonary disease) ([place])   . Coronary artery disease     HOSPITAL COURSE:  59 year old male with known chronic systolic dysfunction congestive heart failure coronary artery disease diabetes with complication essential hypertension , admitted after AICD firing. Please see Dr Tobey Grim dictated H & P for further details. Cardio c/s was obtained who didn't feel any need for further cardiac interventions. Serial troponins were negative but patient continued to remain persistently hypotensive for which he required short stay in ICU for some concern for sepsis although this was ruled out.  Subsequently he was noted to have nausea and some epigastric-RUQ pain for which he underwent abd [place] and was found to have gall stones and acute cholecystitis. Surgery c/s was obtained who recommended surgery which was performed on 11/26.  * ARF: likely prerenal, RESOLVED.  * Acute cholecystitis and gall  stones. S/p Lap cholecystectomy on 11/26 without any immediate complications.  * Hyponatremia: resolved with hydration.  * Severe hypokalemia: Repleted and resolved  * Chest pain - Appreciate cardiology input. AICD interrogation per Cardio - likely outpatient  * Elevated troponin - due to supply demand ischemia in the setting of chronic systolic heart failure, or potentially AICD firing.   * AICD discharge - d/w cardiology for AICD interrogation - likely outpatient  * Chronic systolic heart failure ([place]) - Held lasix, Aldactone, Metoprolol, Lotensin due to Hypotension  * COPD (chronic obstructive pulmonary disease) ([place]) - continue azithromycin, DuoNeb's when necessary.  * Depression - continue LEXAPRO  Patient was feeling much better by 11/28 and was D/C home in stable condition. He was agreeable with D/C plans. DISCHARGE CONDITIONS:   stable  CONSULTS OBTAINED:  Treatment Team:  Hubbard Robinson, MD Anthonette Legato, MD  DRUG ALLERGIES:   Allergies  Allergen Reactions  . Codeine Nausea And Vomiting    DISCHARGE MEDICATIONS:   Discharge Medication List as of 09/01/2015  3:16 PM    START taking these medications   Details  oxyCODONE-acetaminophen (PERCOCET) 7.5-325 MG tablet Take 1 tablet by mouth every 8 (eight) hours as needed for severe pain., Starting 09/01/2015, Until Discontinued, Print      CONTINUE these medications which have NOT CHANGED   Details  albuterol (PROVENTIL HFA;VENTOLIN HFA) 108 (90 BASE) MCG/ACT inhaler Inhale 2 puffs into the lungs every 6 (six) hours as needed for wheezing or shortness of breath., Until Discontinued, Historical Med    atorvastatin (LIPITOR)  20 MG tablet Take 20 mg by mouth every evening., Until Discontinued, Historical Med    baclofen (LIORESAL) 10 MG tablet Take 10 mg by mouth 3 (three) times daily., Until Discontinued, Historical Med    metolazone (ZAROXOLYN) 5 MG tablet Take 5 mg by mouth daily as needed (for 4-5 pound  weight gain)., Until Discontinued, Historical Med    potassium chloride SA (K-DUR,KLOR-CON) 20 MEQ tablet Take 20 mEq by mouth daily., Until Discontinued, Historical Med    aspirin 325 MG tablet Take 325 mg by mouth daily.  , Until Discontinued, Historical Med    benazepril (LOTENSIN) 5 MG tablet Take 5 mg by mouth daily., Until Discontinued, Historical Med    escitalopram (LEXAPRO) 20 MG tablet Take 20 mg by mouth daily., Until Discontinued, Historical Med    furosemide (LASIX) 40 MG tablet Take 40 mg by mouth 2 (two) times daily. Take an additional 40mg   In AM if weight over 4 lbs, Until Discontinued, Historical Med    insulin aspart (NOVOLOG) 100 UNIT/ML injection Inject 10 Units into the skin 3 (three) times daily before meals., Until Discontinued, Historical Med    insulin glargine (LANTUS) 100 UNIT/ML injection Inject 50 Units into the skin at bedtime., Until Discontinued, Historical Med    metFORMIN (GLUCOPHAGE) 1000 MG tablet Take 1,000 mg by mouth 2 (two) times daily., Until Discontinued, Historical Med    metoprolol succinate (TOPROL-XL) 100 MG 24 hr tablet Take 100 mg by mouth daily. Take with or immediately following a meal., Until Discontinued, Historical Med    spironolactone (ALDACTONE) 50 MG tablet Take 50 mg by mouth daily., Until Discontinued, Historical Med       DISCHARGE INSTRUCTIONS:    DIET:  Regular diet  DISCHARGE CONDITION:  Good  ACTIVITY:  Activity as tolerated  OXYGEN:  Home Oxygen: No.   Oxygen Delivery: room air  DISCHARGE LOCATION:  home   If you experience worsening of your admission symptoms, develop shortness of breath, life threatening emergency, suicidal or homicidal thoughts you must seek medical attention immediately by calling 911 or calling your MD immediately  if symptoms less severe.  You Must read complete instructions/literature along with all the possible adverse reactions/side effects for all the Medicines you take and that  have been prescribed to you. Take any new Medicines after you have completely understood and accpet all the possible adverse reactions/side effects.   Please note  You were cared for by a hospitalist during your hospital stay. If you have any questions about your discharge medications or the care you received while you were in the hospital after you are discharged, you can call the unit and asked to speak with the hospitalist on call if the hospitalist that took care of you is not available. Once you are discharged, your primary care physician will handle any further medical issues. Please note that NO REFILLS for any discharge medications will be authorized once you are discharged, as it is imperative that you return to your primary care physician (or establish a relationship with a primary care physician if you do not have one) for your aftercare needs so that they can reassess your need for medications and monitor your lab values.    On the day of Discharge:  VITAL SIGNS:  Blood pressure 100/66, pulse 82, temperature 97.6 F (36.4 C), temperature source Oral, resp. rate 17, height 5\' 9"  (1.753 m), weight 111.857 kg (246 lb 9.6 oz), SpO2 97 %. PHYSICAL EXAMINATION:  GENERAL:  59 y.o.-year-old  patient lying in the bed with no acute distress.  EYES: Pupils equal, round, reactive to light and accommodation. No scleral icterus. Extraocular muscles intact.  HEENT: Head atraumatic, normocephalic. Oropharynx and nasopharynx clear.  NECK:  Supple, no jugular venous distention. No thyroid enlargement, no tenderness.  LUNGS: Normal breath sounds bilaterally, no wheezing, rales,rhonchi or crepitation. No use of accessory muscles of respiration.  CARDIOVASCULAR: S1, S2 normal. No murmurs, rubs, or gallops.  ABDOMEN: Soft, non-tender, non-distended. Bowel sounds present. No organomegaly or mass.  EXTREMITIES: No pedal edema, cyanosis, or clubbing.  NEUROLOGIC: Cranial nerves II through XII are intact.  Muscle strength 5/5 in all extremities. Sensation intact. Gait not checked.  PSYCHIATRIC: The patient is alert and oriented x 3.  SKIN: No obvious rash, lesion, or ulcer.  DATA REVIEW:  Management plans discussed with the patient, family and they are in agreement.  CODE STATUS: FULL CODE  TOTAL TIME TAKING CARE OF THIS PATIENT: 55 minutes.    [facility], Hani Patnode M.D on 09/12/2015 at 11:15 AM  Between 7am to 6pm - Pager - [phone]  After 6pm go to [email or web address] - password [email or web address] [facility]  Office  [phone]  CC: Primary care physician; Placido Sou, MD Hubbard Robinson, MD  Note: This dictation was prepared with Dragon dictation along with smaller phrase technology. Any transcriptional errors that result from this process are unintentional.

## 2015-09-30 ENCOUNTER — Encounter: Payer: Self-pay | Admitting: Emergency Medicine

## 2015-09-30 ENCOUNTER — Emergency Department
Admission: EM | Admit: 2015-09-30 | Discharge: 2015-09-30 | Disposition: A | Discharge status: Home / Self Care | Payer: Medicare HMO | Attending: Emergency Medicine | Admitting: Emergency Medicine

## 2015-09-30 ENCOUNTER — Emergency Department: Payer: Medicare HMO

## 2015-09-30 ENCOUNTER — Other Ambulatory Visit: Payer: Self-pay

## 2015-09-30 DIAGNOSIS — I10 Essential (primary) hypertension: Secondary | ICD-10-CM | POA: Insufficient documentation

## 2015-09-30 DIAGNOSIS — Z7984 Long term (current) use of oral hypoglycemic drugs: Secondary | ICD-10-CM | POA: Insufficient documentation

## 2015-09-30 DIAGNOSIS — Z794 Long term (current) use of insulin: Secondary | ICD-10-CM | POA: Diagnosis not present

## 2015-09-30 DIAGNOSIS — E119 Type 2 diabetes mellitus without complications: Secondary | ICD-10-CM | POA: Diagnosis not present

## 2015-09-30 DIAGNOSIS — J441 Chronic obstructive pulmonary disease with (acute) exacerbation: Secondary | ICD-10-CM | POA: Diagnosis not present

## 2015-09-30 DIAGNOSIS — Z7982 Long term (current) use of aspirin: Secondary | ICD-10-CM | POA: Diagnosis not present

## 2015-09-30 DIAGNOSIS — R079 Chest pain, unspecified: Secondary | ICD-10-CM | POA: Diagnosis present

## 2015-09-30 DIAGNOSIS — Z79899 Other long term (current) drug therapy: Secondary | ICD-10-CM | POA: Diagnosis not present

## 2015-09-30 LAB — CBC
HCT: 36 % — ABNORMAL LOW (ref 40.0–52.0)
HEMOGLOBIN: 12.1 g/dL — AB (ref 13.0–18.0)
MCH: 30.5 pg (ref 26.0–34.0)
MCHC: 33.5 g/dL (ref 32.0–36.0)
MCV: 90.9 fL (ref 80.0–100.0)
Platelets: 116 10*3/uL — ABNORMAL LOW (ref 150–440)
RBC: 3.96 MIL/uL — AB (ref 4.40–5.90)
RDW: 13.7 % (ref 11.5–14.5)
WBC: 8.3 10*3/uL (ref 3.8–10.6)

## 2015-09-30 LAB — COMPREHENSIVE METABOLIC PANEL
ALK PHOS: 72 U/L (ref 38–126)
ALT: 27 U/L (ref 17–63)
AST: 20 U/L (ref 15–41)
Albumin: 3.9 g/dL (ref 3.5–5.0)
Anion gap: 8 (ref 5–15)
BUN: 34 mg/dL — AB (ref 6–20)
CALCIUM: 8.8 mg/dL — AB (ref 8.9–10.3)
CHLORIDE: 99 mmol/L — AB (ref 101–111)
CO2: 30 mmol/L (ref 22–32)
CREATININE: 1.02 mg/dL (ref 0.61–1.24)
Glucose, Bld: 264 mg/dL — ABNORMAL HIGH (ref 65–99)
Potassium: 3.8 mmol/L (ref 3.5–5.1)
SODIUM: 137 mmol/L (ref 135–145)
Total Bilirubin: 0.9 mg/dL (ref 0.3–1.2)
Total Protein: 7 g/dL (ref 6.5–8.1)

## 2015-09-30 LAB — TROPONIN I: Troponin I: <0.03 ng/mL (ref <0.031)

## 2015-09-30 LAB — BRAIN NATRIURETIC PEPTIDE: B NATRIURETIC PEPTIDE 5: 2375 pg/mL — AB (ref 0.0–100.0)

## 2015-09-30 MED ORDER — IPRATROPIUM-ALBUTEROL 0.5-2.5 (3) MG/3ML IN SOLN
RESPIRATORY_TRACT | Status: AC
  Administered 2015-09-30: 3 mL via RESPIRATORY_TRACT
  Filled 2015-09-30: qty 3

## 2015-09-30 MED ORDER — PREDNISONE 50 MG PO TABS: 50.0000 mg | ORAL_TABLET | Freq: Every day | ORAL | Status: DC

## 2015-09-30 MED ORDER — ASPIRIN 81 MG PO CHEW
324.0000 mg | CHEWABLE_TABLET | Freq: Once | ORAL | Status: AC
  Administered 2015-09-30: 324 mg via ORAL
  Filled 2015-09-30: qty 4

## 2015-09-30 MED ORDER — NITROGLYCERIN 0.4 MG SL SUBL
0.4000 mg | SUBLINGUAL_TABLET | Freq: Once | SUBLINGUAL | Status: AC
  Administered 2015-09-30: 0.4 mg via SUBLINGUAL
  Filled 2015-09-30: qty 1

## 2015-09-30 MED ORDER — IPRATROPIUM-ALBUTEROL 0.5-2.5 (3) MG/3ML IN SOLN
3.0000 mL | Freq: Once | RESPIRATORY_TRACT | Status: AC
  Administered 2015-09-30: 3 mL via RESPIRATORY_TRACT
  Filled 2015-09-30: qty 3

## 2015-09-30 NOTE — ED Provider Notes (Signed)
[facility] Emergency Department Provider Note  ____________________________________________    I have reviewed the triage vital signs and the nursing notes.   HISTORY  Chief Complaint Breathing difficulty   HPI Ronald Miller. is a 59 y.o. male with an extensive past medical history including diabetes, CABG, CAD, COPD, CHFwho apparently recently moved back to the area and does not have cardiology follow-up. He reports 2 days of increasing shortness of breath. He does note occasional mild chest tightness although has not had any chest discomfort today. He denies fevers chills. No cough. He denies smoking although he did in the past      Past Medical History  Diagnosis Date  . ASCVD (arteriosclerotic cardiovascular disease)   . Diabetes mellitus   . Hypertension   . History of cocaine abuse   . Tobacco abuse   . Depression   . Suicidal ideation   . Chronic systolic CHF (congestive heart failure) ([place])   . COPD (chronic obstructive pulmonary disease) ([place])   . Coronary artery disease     Patient Active Problem List   Diagnosis Date Noted  . Chronic cholecystitis with calculus   . COPD (chronic obstructive pulmonary disease) ([place]) 08/28/2015  . HTN (hypertension) 08/28/2015  . Depression 08/28/2015  . Chest pain 08/28/2015  . Elevated troponin 08/28/2015  . AICD discharge 08/28/2015  . Hypokalemia 08/28/2015  . Hypotension 08/28/2015  . Chronic systolic heart failure ([place]) 02/04/2015  . Arteriosclerosis of coronary artery 03/05/2014  . Diabetes mellitus ([place]) 03/05/2014  . HLD (hyperlipidemia) 03/05/2014  . Type 2 diabetes mellitus ([place]) 08/06/2009  . TOBACCO ABUSE 08/06/2009  . ATHEROSCLEROTIC CARDIOVASCULAR DISEASE 08/06/2009  . LOW BACK PAIN, CHRONIC 08/06/2009    Past Surgical History  Procedure Laterality Date  . Coronary artery bypass graft  2001  . Lumbar disc surgery  02/1999    Discectomy and fusion  . Vasectomy     Subsequent reversal  . Insert / replace / remove pacemaker    . Cardiac defibrillator placement    . Cholecystectomy N/A 08/30/2015    Procedure: LAPAROSCOPIC CHOLECYSTECTOMY;  Surgeon: Hubbard Robinson, MD;  Location: [name];  Service: General;  Laterality: N/A;  . Cardiac defibrillator placement      Current Outpatient Rx  Name  Route  Sig  Dispense  Refill  . albuterol (PROVENTIL HFA;VENTOLIN HFA) 108 (90 BASE) MCG/ACT inhaler   Inhalation   Inhale 2 puffs into the lungs every 6 (six) hours as needed for wheezing or shortness of breath.         Marland Kitchen aspirin EC 81 MG tablet   Oral   Take 81 mg by mouth daily.         Marland Kitchen atorvastatin (LIPITOR) 20 MG tablet   Oral   Take 20 mg by mouth every evening.         . baclofen (LIORESAL) 10 MG tablet   Oral   Take 10 mg by mouth 3 (three) times daily.         . benazepril (LOTENSIN) 5 MG tablet   Oral   Take 1 tablet (5 mg total) by mouth daily.   30 tablet   0   . escitalopram (LEXAPRO) 20 MG tablet   Oral   Take 1 tablet (20 mg total) by mouth daily.   30 tablet   0   . furosemide (LASIX) 40 MG tablet   Oral   Take 1 tablet (40 mg total) by mouth 2 (two)  times daily. Take an additional 40mg   In AM if weight over 4 lbs   60 tablet   1   . insulin aspart (NOVOLOG) 100 UNIT/ML injection   Subcutaneous   Inject 10 Units into the skin 3 (three) times daily before meals.   10 mL   11   . insulin glargine (LANTUS) 100 UNIT/ML injection   Subcutaneous   Inject 0.5 mLs (50 Units total) into the skin at bedtime.   10 mL   5   . metFORMIN (GLUCOPHAGE) 1000 MG tablet   Oral   Take 1 tablet (1,000 mg total) by mouth 2 (two) times daily.   60 tablet   0   . metolazone (ZAROXOLYN) 5 MG tablet   Oral   Take 5 mg by mouth daily as needed (for 4-5 pound weight gain).         . metoprolol succinate (TOPROL-XL) 100 MG 24 hr tablet   Oral   Take 1 tablet (100 mg total) by mouth daily. Take with or immediately  following a meal.   60 tablet   0   . potassium chloride SA (K-DUR,KLOR-CON) 20 MEQ tablet   Oral   Take 20 mEq by mouth daily.         Marland Kitchen spironolactone (ALDACTONE) 50 MG tablet   Oral   Take 1 tablet (50 mg total) by mouth daily.   30 tablet   2   . aspirin 325 MG tablet   Oral   Take 1 tablet (325 mg total) by mouth daily.   30 tablet   0   . oxyCODONE-acetaminophen (PERCOCET) 7.5-325 MG tablet   Oral   Take 1 tablet by mouth every 8 (eight) hours as needed for severe pain. Patient not taking: Reported on 09/30/2015   20 tablet   0     Allergies Codeine  Family History  Problem Relation Age of Onset  . CAD    . Diabetes      Social History Social History  Substance Use Topics  . Smoking status: Smoker, Current Status Unknown -- 1.50 packs/day for 23 years    Types: Cigarettes  . Smokeless tobacco: Never Used  . Alcohol Use: No    Review of Systems  Constitutional: Negative for fever. Eyes: Negative for visual changes. ENT: Negative for sore throat Cardiovascular: Negative for chest pain today Respiratory: As above Gastrointestinal: Negative for abdominal pain, vomiting and diarrhea. Genitourinary: Negative for dysuria. Musculoskeletal: Negative for back pain. Skin: Negative for rash. Neurological: Negative for headaches or focal weakness Psychiatric: No anxiety or depression    ____________________________________________   PHYSICAL EXAM:  VITAL SIGNS: ED Triage Vitals  Enc Vitals Group     BP 09/30/15 1027 105/72 mmHg     Pulse Rate 09/30/15 1027 89     Resp 09/30/15 1027 22     Temp 09/30/15 1027 98 F (36.7 C)     Temp src --      SpO2 09/30/15 1027 97 %     Weight 09/30/15 1027 233 lb (105.688 kg)     Height 09/30/15 1027 5\' 9"  (1.753 m)     Head Cir --      Peak Flow --      Pain Score 09/30/15 1025 8     Pain Loc --      Pain Edu? --      Excl. in [place]? --      Constitutional: Alert and oriented. Well appearing and in no  distress. Pleasant and interactive Eyes: Conjunctivae are normal.  ENT   Head: Normocephalic and atraumatic.   Mouth/Throat: Mucous membranes are moist. Cardiovascular: Normal rate, regular rhythm. Normal and symmetric distal pulses are present in all extremities.  Respiratory: Normal respiratory effort without tachypnea nor retractions. Scattered wheezes noted Gastrointestinal: Soft and non-tender in all quadrants. No distention. . Genitourinary: deferred Musculoskeletal: Nontender with normal range of motion in all extremities. No lower extremity tenderness nor edema. Neurologic:  Normal speech and language. No gross focal neurologic deficits are appreciated. Skin:  Skin is warm, dry and intact. No rash noted. Psychiatric: Mood and affect are normal. Patient exhibits appropriate insight and judgment.  ____________________________________________    LABS (pertinent positives/negatives)  Labs Reviewed  CBC - Abnormal; Notable for the following:    RBC 3.96 (*)    Hemoglobin 12.1 (*)    HCT 36.0 (*)    Platelets 116 (*)    All other components within normal limits  COMPREHENSIVE METABOLIC PANEL - Abnormal; Notable for the following:    Chloride 99 (*)    Glucose, Bld 264 (*)    BUN 34 (*)    Calcium 8.8 (*)    All other components within normal limits  BRAIN NATRIURETIC PEPTIDE - Abnormal; Notable for the following:    B Natriuretic Peptide 2375.0 (*)    All other components within normal limits  TROPONIN I    ____________________________________________   EKG  ED ECG REPORT I, Lavonia Drafts, the attending physician, personally viewed and interpreted this ECG.   Date: 09/30/2015  EKG Time: 10:24 AM  Rate: 86  Rhythm: Normal sinus rhythm  Axis: Left axis deviation  Intervals:right bundle branch block  ST&T Change: Nonspecific changes   ____________________________________________    RADIOLOGY I have personally reviewed any xrays that were ordered on  this patient: Chest x-ray unremarkable  ____________________________________________   PROCEDURES  Procedure(s) performed: none  Critical Care performed: none  ____________________________________________   INITIAL IMPRESSION / ASSESSMENT AND PLAN / ED COURSE  Pertinent labs & imaging results that were available during my care of the patient were reviewed by me and considered in my medical decision making (see chart for details).  Patient presents with mild chest tightness and increased shortness of breath over the last 2 days. There is no indication of a infectious process. I do not believe his presentation is consistent with ACS, rather I'm more concerned about possible COPD/bronchospasm given the description of tightness and scattered wheezes on exam. His lab work is unremarkable including normal enzymes and he had significant improvement with the DuoNeb I believe is consistent with a diagnosis of bronchospasm. Also He has been chest pain-free in the emergency department. He definitely needs follow-up with cardiologist which he does not have despite his extensive cardiac history. We discussed this plan and he agrees to follow up with cardiology ASAP  He feels improved after DuoNeb we will provide steroids prescription, return precautions discussed ____________________________________________   FINAL CLINICAL IMPRESSION(S) / ED DIAGNOSES  Final diagnoses:  COPD with acute exacerbation ([place])     Lavonia Drafts, MD 09/30/15 1450

## 2015-09-30 NOTE — ED Notes (Signed)
States he has had intermittent chest pain with increased SOB for 2 days  Describes as tightness to upper chest

## 2015-09-30 NOTE — Discharge Instructions (Signed)
Chronic Obstructive Pulmonary Disease Chronic obstructive pulmonary disease (COPD) is a common lung condition in which airflow from the lungs is limited. COPD is a general term that can be used to describe many different lung problems that limit airflow, including both chronic bronchitis and emphysema. If you have COPD, your lung function will probably never return to normal, but there are measures you can take to improve lung function and make yourself feel better. CAUSES   Smoking (common).  Exposure to secondhand smoke.  Genetic problems.  Chronic inflammatory lung diseases or recurrent infections. SYMPTOMS  Shortness of breath, especially with physical activity.  Deep, persistent (chronic) cough with a large amount of thick mucus.  Wheezing.  Rapid breaths (tachypnea).  Gray or bluish discoloration (cyanosis) of the skin, especially in your fingers, toes, or lips.  Fatigue.  Weight loss.  Frequent infections or episodes when breathing symptoms become much worse (exacerbations).  Chest tightness. DIAGNOSIS Your health care provider will take a medical history and perform a physical examination to diagnose COPD. Additional tests for COPD may include:  Lung (pulmonary) function tests.  Chest X-ray.  CT scan.  Blood tests. TREATMENT  Treatment for COPD may include:  Inhaler and nebulizer medicines. These help manage the symptoms of COPD and make your breathing more comfortable.  Supplemental oxygen. Supplemental oxygen is only helpful if you have a low oxygen level in your blood.  Exercise and physical activity. These are beneficial for nearly all people with COPD.  Lung surgery or transplant.  Nutrition therapy to gain weight, if you are underweight.  Pulmonary rehabilitation. This may involve working with a team of health care providers and specialists, such as respiratory, occupational, and physical therapists. HOME CARE INSTRUCTIONS  Take all medicines  (inhaled or pills) as directed by your health care provider.  Avoid over-the-counter medicines or cough syrups that dry up your airway (such as antihistamines) and slow down the elimination of secretions unless instructed otherwise by your health care provider.  If you are a smoker, the most important thing that you can do is stop smoking. Continuing to smoke will cause further lung damage and breathing trouble. Ask your health care provider for help with quitting smoking. He or she can direct you to community resources or hospitals that provide support.  Avoid exposure to irritants such as smoke, chemicals, and fumes that aggravate your breathing.  Use oxygen therapy and pulmonary rehabilitation if directed by your health care provider. If you require home oxygen therapy, ask your health care provider whether you should purchase a pulse oximeter to measure your oxygen level at home.  Avoid contact with individuals who have a contagious illness.  Avoid extreme temperature and humidity changes.  Eat healthy foods. Eating smaller, more frequent meals and resting before meals may help you maintain your strength.  Stay active, but balance activity with periods of rest. Exercise and physical activity will help you maintain your ability to do things you want to do.  Preventing infection and hospitalization is very important when you have COPD. Make sure to receive all the vaccines your health care provider recommends, especially the pneumococcal and influenza vaccines. Ask your health care provider whether you need a pneumonia vaccine.  Learn and use relaxation techniques to manage stress.  Learn and use controlled breathing techniques as directed by your health care provider. Controlled breathing techniques include:  Pursed lip breathing. Start by breathing in (inhaling) through your nose for 1 second. Then, purse your lips as if you were   going to whistle and breathe out (exhale) through the  pursed lips for 2 seconds.  Diaphragmatic breathing. Start by putting one hand on your abdomen just above your waist. Inhale slowly through your nose. The hand on your abdomen should move out. Then purse your lips and exhale slowly. You should be able to feel the hand on your abdomen moving in as you exhale.  Learn and use controlled coughing to clear mucus from your lungs. Controlled coughing is a series of short, progressive coughs. The steps of controlled coughing are: 1. Lean your head slightly forward. 2. Breathe in deeply using diaphragmatic breathing. 3. Try to hold your breath for 3 seconds. 4. Keep your mouth slightly open while coughing twice. 5. Spit any mucus out into a tissue. 6. Rest and repeat the steps once or twice as needed. SEEK MEDICAL CARE IF:  You are coughing up more mucus than usual.  There is a change in the color or thickness of your mucus.  Your breathing is more labored than usual.  Your breathing is faster than usual. SEEK IMMEDIATE MEDICAL CARE IF:  You have shortness of breath while you are resting.  You have shortness of breath that prevents you from:  Being able to talk.  Performing your usual physical activities.  You have chest pain lasting longer than 5 minutes.  Your skin color is more cyanotic than usual.  You measure low oxygen saturations for longer than 5 minutes with a pulse oximeter. MAKE SURE YOU:  Understand these instructions.  Will watch your condition.  Will get help right away if you are not doing well or get worse.   This information is not intended to replace advice given to you by your health care provider. Make sure you discuss any questions you have with your health care provider.   Document Released: 06/30/2005 Document Revised: 10/11/2014 Document Reviewed: 05/17/2013 Elsevier Interactive Patient Education 2016 [employer]  

## 2015-10-13 ENCOUNTER — Emergency Department: Payer: Medicare HMO

## 2015-10-13 ENCOUNTER — Inpatient Hospital Stay
Admission: EM | Admit: 2015-10-13 | Discharge: 2015-10-21 | DRG: 292 | Disposition: A | Discharge status: Home / Self Care | Payer: Medicare HMO | Attending: Internal Medicine | Admitting: Internal Medicine

## 2015-10-13 ENCOUNTER — Encounter: Payer: Self-pay | Admitting: *Deleted

## 2015-10-13 DIAGNOSIS — E8770 Fluid overload, unspecified: Secondary | ICD-10-CM | POA: Diagnosis present

## 2015-10-13 DIAGNOSIS — Z95810 Presence of automatic (implantable) cardiac defibrillator: Secondary | ICD-10-CM

## 2015-10-13 DIAGNOSIS — Z833 Family history of diabetes mellitus: Secondary | ICD-10-CM

## 2015-10-13 DIAGNOSIS — L03115 Cellulitis of right lower limb: Secondary | ICD-10-CM

## 2015-10-13 DIAGNOSIS — I429 Cardiomyopathy, unspecified: Secondary | ICD-10-CM | POA: Diagnosis present

## 2015-10-13 DIAGNOSIS — I509 Heart failure, unspecified: Secondary | ICD-10-CM | POA: Diagnosis not present

## 2015-10-13 DIAGNOSIS — N5089 Other specified disorders of the male genital organs: Secondary | ICD-10-CM | POA: Diagnosis present

## 2015-10-13 DIAGNOSIS — Z794 Long term (current) use of insulin: Secondary | ICD-10-CM

## 2015-10-13 DIAGNOSIS — Z9049 Acquired absence of other specified parts of digestive tract: Secondary | ICD-10-CM

## 2015-10-13 DIAGNOSIS — Z886 Allergy status to analgesic agent status: Secondary | ICD-10-CM

## 2015-10-13 DIAGNOSIS — E876 Hypokalemia: Secondary | ICD-10-CM | POA: Diagnosis present

## 2015-10-13 DIAGNOSIS — R748 Abnormal levels of other serum enzymes: Secondary | ICD-10-CM | POA: Diagnosis present

## 2015-10-13 DIAGNOSIS — E669 Obesity, unspecified: Secondary | ICD-10-CM | POA: Diagnosis present

## 2015-10-13 DIAGNOSIS — I2510 Atherosclerotic heart disease of native coronary artery without angina pectoris: Secondary | ICD-10-CM | POA: Diagnosis present

## 2015-10-13 DIAGNOSIS — Z8249 Family history of ischemic heart disease and other diseases of the circulatory system: Secondary | ICD-10-CM

## 2015-10-13 DIAGNOSIS — Z79899 Other long term (current) drug therapy: Secondary | ICD-10-CM

## 2015-10-13 DIAGNOSIS — I5023 Acute on chronic systolic (congestive) heart failure: Secondary | ICD-10-CM | POA: Diagnosis present

## 2015-10-13 DIAGNOSIS — Z87891 Personal history of nicotine dependence: Secondary | ICD-10-CM

## 2015-10-13 DIAGNOSIS — Z7982 Long term (current) use of aspirin: Secondary | ICD-10-CM

## 2015-10-13 DIAGNOSIS — I110 Hypertensive heart disease with heart failure: Principal | ICD-10-CM | POA: Diagnosis present

## 2015-10-13 DIAGNOSIS — E1169 Type 2 diabetes mellitus with other specified complication: Secondary | ICD-10-CM

## 2015-10-13 DIAGNOSIS — Z951 Presence of aortocoronary bypass graft: Secondary | ICD-10-CM

## 2015-10-13 DIAGNOSIS — J449 Chronic obstructive pulmonary disease, unspecified: Secondary | ICD-10-CM | POA: Diagnosis present

## 2015-10-13 DIAGNOSIS — R0600 Dyspnea, unspecified: Secondary | ICD-10-CM | POA: Diagnosis present

## 2015-10-13 DIAGNOSIS — I959 Hypotension, unspecified: Secondary | ICD-10-CM | POA: Diagnosis not present

## 2015-10-13 DIAGNOSIS — L0390 Cellulitis, unspecified: Secondary | ICD-10-CM

## 2015-10-13 DIAGNOSIS — L8990 Pressure ulcer of unspecified site, unspecified stage: Secondary | ICD-10-CM | POA: Insufficient documentation

## 2015-10-13 DIAGNOSIS — E785 Hyperlipidemia, unspecified: Secondary | ICD-10-CM | POA: Diagnosis present

## 2015-10-13 DIAGNOSIS — E119 Type 2 diabetes mellitus without complications: Secondary | ICD-10-CM | POA: Diagnosis present

## 2015-10-13 DIAGNOSIS — Z9889 Other specified postprocedural states: Secondary | ICD-10-CM

## 2015-10-13 LAB — BASIC METABOLIC PANEL
Anion gap: 6 (ref 5–15)
BUN: 26 mg/dL — ABNORMAL HIGH (ref 6–20)
CHLORIDE: 98 mmol/L — AB (ref 101–111)
CO2: 35 mmol/L — AB (ref 22–32)
Calcium: 8.9 mg/dL (ref 8.9–10.3)
Creatinine, Ser: 1.12 mg/dL (ref 0.61–1.24)
GFR calc non Af Amer: >60 mL/min (ref >60)
Glucose, Bld: 307 mg/dL — ABNORMAL HIGH (ref 65–99)
POTASSIUM: 3.5 mmol/L (ref 3.5–5.1)
SODIUM: 139 mmol/L (ref 135–145)

## 2015-10-13 LAB — CBC
HEMATOCRIT: 39.4 % — AB (ref 40.0–52.0)
Hemoglobin: 12.9 g/dL — ABNORMAL LOW (ref 13.0–18.0)
MCH: 30.8 pg (ref 26.0–34.0)
MCHC: 32.8 g/dL (ref 32.0–36.0)
MCV: 93.9 fL (ref 80.0–100.0)
PLATELETS: 184 10*3/uL (ref 150–440)
RBC: 4.2 MIL/uL — ABNORMAL LOW (ref 4.40–5.90)
RDW: 16.1 % — AB (ref 11.5–14.5)
WBC: 10.7 10*3/uL — AB (ref 3.8–10.6)

## 2015-10-13 LAB — TROPONIN I: TROPONIN I: 0.06 ng/mL — AB (ref <0.031)

## 2015-10-13 LAB — BRAIN NATRIURETIC PEPTIDE: B NATRIURETIC PEPTIDE 5: 2776 pg/mL — AB (ref 0.0–100.0)

## 2015-10-13 MED ORDER — FUROSEMIDE 10 MG/ML IJ SOLN
60.0000 mg | Freq: Once | INTRAMUSCULAR | Status: AC
  Administered 2015-10-13: 60 mg via INTRAVENOUS
  Filled 2015-10-13: qty 8

## 2015-10-13 NOTE — ED Notes (Signed)
Patient states he has a history of CHF. Patient states he began having BLE edema two days ago, took extra Lasix and is now short of breath and c/o chest tightness.

## 2015-10-13 NOTE — ED Provider Notes (Signed)
[facility] Emergency Department Provider Note   ____________________________________________  Time seen: I have reviewed the triage vital signs and the triage nursing note.  HISTORY  Chief Complaint Shortness of Breath   Historian Patient  HPI Ronald Miller. is a 60 y.o. male with a history of congestive heart failure who is on 40 mg of Lasix twice daily reports increased lower extremity edema all the way up to his abdomen that started about a week ago but the last 3 days he's had trouble walking a few steps without becoming severely short of breath. He's had some central chest pressure with associated with the shortness of breath. Yesterday he took 5 tablets of Lasix and had a little bit of urine output, but no significant output. No fever. Symptoms are moderate.    Past Medical History  Diagnosis Date  . ASCVD (arteriosclerotic cardiovascular disease)   . Diabetes mellitus   . Hypertension   . History of cocaine abuse   . Tobacco abuse   . Depression   . Suicidal ideation   . Chronic systolic CHF (congestive heart failure) ([place])   . COPD (chronic obstructive pulmonary disease) ([place])   . Coronary artery disease     Patient Active Problem List   Diagnosis Date Noted  . Chronic cholecystitis with calculus   . COPD (chronic obstructive pulmonary disease) ([place]) 08/28/2015  . HTN (hypertension) 08/28/2015  . Depression 08/28/2015  . Chest pain 08/28/2015  . Elevated troponin 08/28/2015  . AICD discharge 08/28/2015  . Hypokalemia 08/28/2015  . Hypotension 08/28/2015  . Chronic systolic heart failure ([place]) 02/04/2015  . Arteriosclerosis of coronary artery 03/05/2014  . Diabetes mellitus ([place]) 03/05/2014  . HLD (hyperlipidemia) 03/05/2014  . Type 2 diabetes mellitus ([place]) 08/06/2009  . TOBACCO ABUSE 08/06/2009  . ATHEROSCLEROTIC CARDIOVASCULAR DISEASE 08/06/2009  . LOW BACK PAIN, CHRONIC 08/06/2009    Past Surgical History  Procedure  Laterality Date  . Coronary artery bypass graft  2001  . Lumbar disc surgery  02/1999    Discectomy and fusion  . Vasectomy      Subsequent reversal  . Insert / replace / remove pacemaker    . Cardiac defibrillator placement    . Cholecystectomy N/A 08/30/2015    Procedure: LAPAROSCOPIC CHOLECYSTECTOMY;  Surgeon: Hubbard Robinson, MD;  Location: [name];  Service: General;  Laterality: N/A;  . Cardiac defibrillator placement      Current Outpatient Rx  Name  Route  Sig  Dispense  Refill  . albuterol (PROVENTIL HFA;VENTOLIN HFA) 108 (90 BASE) MCG/ACT inhaler   Inhalation   Inhale 2 puffs into the lungs every 6 (six) hours as needed for wheezing or shortness of breath.         Marland Kitchen aspirin 325 MG tablet   Oral   Take 1 tablet (325 mg total) by mouth daily.   30 tablet   0   . aspirin EC 81 MG tablet   Oral   Take 81 mg by mouth daily.         Marland Kitchen atorvastatin (LIPITOR) 20 MG tablet   Oral   Take 20 mg by mouth every evening.         . baclofen (LIORESAL) 10 MG tablet   Oral   Take 10 mg by mouth 3 (three) times daily.         . benazepril (LOTENSIN) 5 MG tablet   Oral   Take 1 tablet (5 mg total) by mouth daily.  30 tablet   0   . escitalopram (LEXAPRO) 20 MG tablet   Oral   Take 1 tablet (20 mg total) by mouth daily.   30 tablet   0   . furosemide (LASIX) 40 MG tablet   Oral   Take 1 tablet (40 mg total) by mouth 2 (two) times daily. Take an additional 40mg   In AM if weight over 4 lbs   60 tablet   1   . insulin aspart (NOVOLOG) 100 UNIT/ML injection   Subcutaneous   Inject 10 Units into the skin 3 (three) times daily before meals.   10 mL   11   . insulin glargine (LANTUS) 100 UNIT/ML injection   Subcutaneous   Inject 0.5 mLs (50 Units total) into the skin at bedtime.   10 mL   5   . metFORMIN (GLUCOPHAGE) 1000 MG tablet   Oral   Take 1 tablet (1,000 mg total) by mouth 2 (two) times daily.   60 tablet   0   . metolazone (ZAROXOLYN) 5 MG  tablet   Oral   Take 5 mg by mouth daily as needed (for 4-5 pound weight gain).         . metoprolol succinate (TOPROL-XL) 100 MG 24 hr tablet   Oral   Take 1 tablet (100 mg total) by mouth daily. Take with or immediately following a meal.   60 tablet   0   . oxyCODONE-acetaminophen (PERCOCET) 7.5-325 MG tablet   Oral   Take 1 tablet by mouth every 8 (eight) hours as needed for severe pain. Patient not taking: Reported on 09/30/2015   20 tablet   0   . potassium chloride SA (K-DUR,KLOR-CON) 20 MEQ tablet   Oral   Take 20 mEq by mouth daily.         . predniSONE (DELTASONE) 50 MG tablet   Oral   Take 1 tablet (50 mg total) by mouth daily with breakfast.   5 tablet   0   . spironolactone (ALDACTONE) 50 MG tablet   Oral   Take 1 tablet (50 mg total) by mouth daily.   30 tablet   2     Allergies Codeine  Family History  Problem Relation Age of Onset  . CAD    . Diabetes      Social History Social History  Substance Use Topics  . Smoking status: Former Smoker -- 1.50 packs/day for 23 years    Types: Cigarettes  . Smokeless tobacco: Never Used  . Alcohol Use: No    Review of Systems  Constitutional: Negative for fever. Eyes: Negative for visual changes. ENT: Negative for sore throat. Cardiovascular: Positive for chest chest pressure. Respiratory: Positive for shortness of breath. Gastrointestinal: Negative for abdominal pain, vomiting and diarrhea. Genitourinary: Negative for dysuria. Musculoskeletal: Negative for back pain. Skin: Negative for rash. Neurological: Negative for headache. 10 point Review of Systems otherwise negative ____________________________________________   PHYSICAL EXAM:  VITAL SIGNS: ED Triage Vitals  Enc Vitals Group     BP 10/13/15 1754 102/69 mmHg     Pulse Rate 10/13/15 1754 98     Resp 10/13/15 1754 20     Temp 10/13/15 1754 97.5 F (36.4 C)     Temp Source 10/13/15 1754 Oral     SpO2 10/13/15 1754 95 %      Weight 10/13/15 1754 242 lb (109.77 kg)     Height 10/13/15 1754 5\' 9"  (1.753 m)  Head Cir --      Peak Flow --      Pain Score 10/13/15 1758 9     Pain Loc --      Pain Edu? --      Excl. in [place]? --      Constitutional: Alert and oriented. Well appearing and in no distress. Eyes: Conjunctivae are normal. PERRL. Normal extraocular movements. ENT   Head: Normocephalic and atraumatic.   Nose: No congestion/rhinnorhea.   Mouth/Throat: Mucous membranes are moist.   Neck: No stridor. Cardiovascular/Chest: Normal rate, regular rhythm.  No murmurs, rubs, or gallops. Respiratory: Normal respiratory effort without tachypnea nor retractions. Breath sounds are clear and equal bilaterally. No wheezes/rales/rhonchi. Gastrointestinal: Soft. No distention, no guarding, no rebound. Nontender. Protuberant abdomen.  Genitourinary/rectal:Deferred Musculoskeletal: Nontender with normal range of motion in all extremities. No joint effusions. 3+ bilateral lower extremity pitting edema from the feet although it to the thighs. Neurologic:  Normal speech and language. No gross or focal neurologic deficits are appreciated. Skin:  Skin is warm, dry and intact. No rash noted. Psychiatric: Mood and affect are normal. Speech and behavior are normal. Patient exhibits appropriate insight and judgment.  ____________________________________________   EKG I, Lisa Roca, MD, the attending physician have personally viewed and interpreted all ECGs.  94 bpm. Demand pacemaker. Right bundle branch block. Nonspecific T wave. ____________________________________________  LABS (pertinent positives/negatives)  Basic metabolic panel significant for CO2 35, glucose 307 and BUN 26 with creatinine 1.12 Troponin 0.06 BNP 2776 White blood count 10.7 and hemoglobin 12.9 with platelet count 184  ____________________________________________  RADIOLOGY All Xrays were viewed by me. Imaging interpreted by  Radiologist.  Chest 2 view:  IMPRESSION: Stable cardiomegaly and chronic vascular congestion. No overt pulmonary edema. __________________________________________  PROCEDURES  Procedure(s) performed: None  Critical Care performed: None  ____________________________________________   ED COURSE / ASSESSMENT AND PLAN  CONSULTATIONS: Hospitalist for admission  Pertinent labs & imaging results that were available during my care of the patient were reviewed by me and considered in my medical decision making (see chart for details).   Patient has worsening peripheral edema and shortness of breath concerning for pulmonary edema. Here his O2 sat is in the mid 90s at rest, however he can hardly sit up without coughing and symptomatic shortness of breath. He did try increasing his Lasix dose at home, without success. Patient will need to be admitted to the hospital for failed outpatient treatment and inpatient therapy/treatment for CHF exacerbation.  History  Mr. Holland Commons elevated at 0.06, however in the past his baseline has been between 0.05 and 0.07.  Patient / Family / Caregiver informed of clinical course, medical decision-making process, and agree with plan.    ___________________________________________   FINAL CLINICAL IMPRESSION(S) / ED DIAGNOSES   Final diagnoses:  Acute on chronic congestive heart failure, unspecified congestive heart failure type [facility])              Note: This dictation was prepared with Dragon dictation. Any transcriptional errors that result from this process are unintentional   Lisa Roca, MD 10/13/15 2201

## 2015-10-13 NOTE — ED Notes (Signed)
Patient transported to X-ray 

## 2015-10-14 ENCOUNTER — Encounter: Payer: Self-pay | Admitting: Internal Medicine

## 2015-10-14 ENCOUNTER — Inpatient Hospital Stay
Admit: 2015-10-14 | Discharge: 2015-10-14 | Disposition: A | Discharge status: Home / Self Care | Payer: Medicare HMO | Attending: Internal Medicine | Admitting: Internal Medicine

## 2015-10-14 DIAGNOSIS — Z8249 Family history of ischemic heart disease and other diseases of the circulatory system: Secondary | ICD-10-CM | POA: Diagnosis not present

## 2015-10-14 DIAGNOSIS — Z87891 Personal history of nicotine dependence: Secondary | ICD-10-CM | POA: Diagnosis not present

## 2015-10-14 DIAGNOSIS — L03115 Cellulitis of right lower limb: Secondary | ICD-10-CM | POA: Diagnosis present

## 2015-10-14 DIAGNOSIS — Z886 Allergy status to analgesic agent status: Secondary | ICD-10-CM | POA: Diagnosis not present

## 2015-10-14 DIAGNOSIS — N5089 Other specified disorders of the male genital organs: Secondary | ICD-10-CM | POA: Diagnosis present

## 2015-10-14 DIAGNOSIS — E8770 Fluid overload, unspecified: Secondary | ICD-10-CM | POA: Diagnosis present

## 2015-10-14 DIAGNOSIS — I110 Hypertensive heart disease with heart failure: Secondary | ICD-10-CM | POA: Diagnosis present

## 2015-10-14 DIAGNOSIS — L8990 Pressure ulcer of unspecified site, unspecified stage: Secondary | ICD-10-CM | POA: Diagnosis present

## 2015-10-14 DIAGNOSIS — Z833 Family history of diabetes mellitus: Secondary | ICD-10-CM | POA: Diagnosis not present

## 2015-10-14 DIAGNOSIS — J449 Chronic obstructive pulmonary disease, unspecified: Secondary | ICD-10-CM | POA: Diagnosis present

## 2015-10-14 DIAGNOSIS — E785 Hyperlipidemia, unspecified: Secondary | ICD-10-CM | POA: Diagnosis present

## 2015-10-14 DIAGNOSIS — I2510 Atherosclerotic heart disease of native coronary artery without angina pectoris: Secondary | ICD-10-CM | POA: Diagnosis present

## 2015-10-14 DIAGNOSIS — Z79899 Other long term (current) drug therapy: Secondary | ICD-10-CM | POA: Diagnosis not present

## 2015-10-14 DIAGNOSIS — I959 Hypotension, unspecified: Secondary | ICD-10-CM | POA: Diagnosis not present

## 2015-10-14 DIAGNOSIS — I5023 Acute on chronic systolic (congestive) heart failure: Secondary | ICD-10-CM | POA: Diagnosis present

## 2015-10-14 DIAGNOSIS — Z95810 Presence of automatic (implantable) cardiac defibrillator: Secondary | ICD-10-CM | POA: Diagnosis not present

## 2015-10-14 DIAGNOSIS — I509 Heart failure, unspecified: Secondary | ICD-10-CM | POA: Diagnosis present

## 2015-10-14 DIAGNOSIS — Z794 Long term (current) use of insulin: Secondary | ICD-10-CM | POA: Diagnosis not present

## 2015-10-14 DIAGNOSIS — R748 Abnormal levels of other serum enzymes: Secondary | ICD-10-CM | POA: Diagnosis present

## 2015-10-14 DIAGNOSIS — Z7982 Long term (current) use of aspirin: Secondary | ICD-10-CM | POA: Diagnosis not present

## 2015-10-14 DIAGNOSIS — E876 Hypokalemia: Secondary | ICD-10-CM | POA: Diagnosis present

## 2015-10-14 DIAGNOSIS — R0600 Dyspnea, unspecified: Secondary | ICD-10-CM | POA: Diagnosis present

## 2015-10-14 DIAGNOSIS — I429 Cardiomyopathy, unspecified: Secondary | ICD-10-CM | POA: Diagnosis present

## 2015-10-14 DIAGNOSIS — Z9889 Other specified postprocedural states: Secondary | ICD-10-CM | POA: Diagnosis not present

## 2015-10-14 DIAGNOSIS — Z951 Presence of aortocoronary bypass graft: Secondary | ICD-10-CM | POA: Diagnosis not present

## 2015-10-14 DIAGNOSIS — E669 Obesity, unspecified: Secondary | ICD-10-CM | POA: Diagnosis present

## 2015-10-14 DIAGNOSIS — Z9049 Acquired absence of other specified parts of digestive tract: Secondary | ICD-10-CM | POA: Diagnosis not present

## 2015-10-14 DIAGNOSIS — E119 Type 2 diabetes mellitus without complications: Secondary | ICD-10-CM | POA: Diagnosis present

## 2015-10-14 LAB — CBC
HCT: 37.8 % — ABNORMAL LOW (ref 40.0–52.0)
Hemoglobin: 12.4 g/dL — ABNORMAL LOW (ref 13.0–18.0)
MCH: 30.1 pg (ref 26.0–34.0)
MCHC: 32.8 g/dL (ref 32.0–36.0)
MCV: 91.9 fL (ref 80.0–100.0)
PLATELETS: 142 10*3/uL — AB (ref 150–440)
RBC: 4.11 MIL/uL — ABNORMAL LOW (ref 4.40–5.90)
RDW: 15.9 % — AB (ref 11.5–14.5)
WBC: 9.4 10*3/uL (ref 3.8–10.6)

## 2015-10-14 LAB — TROPONIN I
TROPONIN I: 0.08 ng/mL — AB (ref <0.031)
TROPONIN I: 0.07 ng/mL — AB (ref <0.031)
TROPONIN I: 0.07 ng/mL — AB (ref <0.031)

## 2015-10-14 LAB — MRSA PCR SCREENING: MRSA by PCR: NEGATIVE

## 2015-10-14 LAB — GLUCOSE, CAPILLARY
Glucose-Capillary: 239 mg/dL — ABNORMAL HIGH (ref 65–99)
Glucose-Capillary: 112 mg/dL — ABNORMAL HIGH (ref 65–99)
Glucose-Capillary: 149 mg/dL — ABNORMAL HIGH (ref 65–99)

## 2015-10-14 LAB — CREATININE, SERUM
CREATININE: 1.04 mg/dL (ref 0.61–1.24)
GFR calc Af Amer: >60 mL/min (ref >60)

## 2015-10-14 MED ORDER — SODIUM CHLORIDE 0.9 % IJ SOLN
3.0000 mL | Freq: Two times a day (BID) | INTRAMUSCULAR | Status: DC
  Administered 2015-10-14 – 2015-10-19 (×9): 3 mL via INTRAVENOUS

## 2015-10-14 MED ORDER — ASPIRIN EC 81 MG PO TBEC
81.0000 mg | DELAYED_RELEASE_TABLET | Freq: Every day | ORAL | Status: DC
  Administered 2015-10-14 – 2015-10-21 (×8): 81 mg via ORAL
  Filled 2015-10-14 (×8): qty 1

## 2015-10-14 MED ORDER — ESCITALOPRAM OXALATE 10 MG PO TABS
20.0000 mg | ORAL_TABLET | Freq: Every day | ORAL | Status: DC
  Administered 2015-10-14 – 2015-10-21 (×8): 20 mg via ORAL
  Filled 2015-10-14 (×8): qty 2

## 2015-10-14 MED ORDER — ATORVASTATIN CALCIUM 20 MG PO TABS
20.0000 mg | ORAL_TABLET | Freq: Every evening | ORAL | Status: DC
Start: 2015-10-14 — End: 2015-10-21
  Administered 2015-10-14 – 2015-10-20 (×6): 20 mg via ORAL
  Filled 2015-10-14 (×6): qty 1

## 2015-10-14 MED ORDER — METFORMIN HCL 500 MG PO TABS
1000.0000 mg | ORAL_TABLET | Freq: Two times a day (BID) | ORAL | Status: DC
  Administered 2015-10-14 – 2015-10-21 (×15): 1000 mg via ORAL
  Filled 2015-10-14 (×15): qty 2

## 2015-10-14 MED ORDER — INSULIN GLARGINE 100 UNIT/ML ~~LOC~~ SOLN
50.0000 [IU] | Freq: Every day | SUBCUTANEOUS | Status: DC
  Administered 2015-10-14: 50 [IU] via SUBCUTANEOUS
  Filled 2015-10-14 (×3): qty 0.5

## 2015-10-14 MED ORDER — ENOXAPARIN SODIUM 40 MG/0.4ML ~~LOC~~ SOLN
40.0000 mg | SUBCUTANEOUS | Status: DC
  Administered 2015-10-14 – 2015-10-19 (×6): 40 mg via SUBCUTANEOUS
  Filled 2015-10-14 (×6): qty 0.4

## 2015-10-14 MED ORDER — ACETAMINOPHEN 325 MG PO TABS
650.0000 mg | ORAL_TABLET | ORAL | Status: DC | PRN
  Administered 2015-10-14 – 2015-10-18 (×2): 650 mg via ORAL
  Filled 2015-10-14 (×2): qty 2

## 2015-10-14 MED ORDER — OXYCODONE-ACETAMINOPHEN 5-325 MG PO TABS
1.0000 | ORAL_TABLET | Freq: Four times a day (QID) | ORAL | Status: DC | PRN
  Administered 2015-10-14 – 2015-10-21 (×18): 1 via ORAL
  Filled 2015-10-14 (×18): qty 1

## 2015-10-14 MED ORDER — METOPROLOL SUCCINATE ER 100 MG PO TB24
100.0000 mg | ORAL_TABLET | Freq: Every day | ORAL | Status: DC
  Administered 2015-10-14 – 2015-10-21 (×6): 100 mg via ORAL
  Filled 2015-10-14 (×7): qty 1

## 2015-10-14 MED ORDER — SODIUM CHLORIDE 0.9 % IV SOLN
250.0000 mL | INTRAVENOUS | Status: DC | PRN
  Administered 2015-10-18: 250 mL via INTRAVENOUS

## 2015-10-14 MED ORDER — POTASSIUM CHLORIDE CRYS ER 20 MEQ PO TBCR
20.0000 meq | EXTENDED_RELEASE_TABLET | Freq: Every day | ORAL | Status: DC
  Administered 2015-10-14 – 2015-10-15 (×2): 20 meq via ORAL
  Filled 2015-10-14 (×2): qty 1

## 2015-10-14 MED ORDER — ONDANSETRON HCL 4 MG/2ML IJ SOLN
4.0000 mg | Freq: Four times a day (QID) | INTRAMUSCULAR | Status: DC | PRN
  Administered 2015-10-15 – 2015-10-20 (×3): 4 mg via INTRAVENOUS
  Filled 2015-10-14 (×3): qty 2

## 2015-10-14 MED ORDER — INSULIN ASPART 100 UNIT/ML ~~LOC~~ SOLN
10.0000 [IU] | Freq: Three times a day (TID) | SUBCUTANEOUS | Status: DC
  Administered 2015-10-14 – 2015-10-16 (×4): 10 [IU] via SUBCUTANEOUS
  Filled 2015-10-14 (×5): qty 10

## 2015-10-14 MED ORDER — INSULIN ASPART 100 UNIT/ML ~~LOC~~ SOLN
0.0000 [IU] | Freq: Every day | SUBCUTANEOUS | Status: DC
  Administered 2015-10-15: 2 [IU] via SUBCUTANEOUS
  Filled 2015-10-14 (×2): qty 2

## 2015-10-14 MED ORDER — BACLOFEN 10 MG PO TABS: 10.0000 mg | ORAL_TABLET | Freq: Three times a day (TID) | ORAL | Status: DC | PRN

## 2015-10-14 MED ORDER — METOLAZONE 2.5 MG PO TABS: 5.0000 mg | ORAL_TABLET | Freq: Every day | ORAL | Status: DC | PRN

## 2015-10-14 MED ORDER — SODIUM CHLORIDE 0.9 % IJ SOLN
3.0000 mL | INTRAMUSCULAR | Status: DC | PRN
  Administered 2015-10-15: 3 mL via INTRAVENOUS
  Administered 2015-10-16: 6 mL via INTRAVENOUS
  Administered 2015-10-17 – 2015-10-19 (×2): 3 mL via INTRAVENOUS
  Filled 2015-10-14 (×4): qty 10

## 2015-10-14 MED ORDER — ENALAPRIL MALEATE 5 MG PO TABS
5.0000 mg | ORAL_TABLET | Freq: Two times a day (BID) | ORAL | Status: DC
  Administered 2015-10-14 – 2015-10-16 (×4): 5 mg via ORAL
  Filled 2015-10-14 (×6): qty 1

## 2015-10-14 MED ORDER — FUROSEMIDE 10 MG/ML IJ SOLN
60.0000 mg | Freq: Two times a day (BID) | INTRAMUSCULAR | Status: DC
  Administered 2015-10-14 – 2015-10-18 (×8): 60 mg via INTRAVENOUS
  Filled 2015-10-14 (×8): qty 6

## 2015-10-14 MED ORDER — INSULIN ASPART 100 UNIT/ML ~~LOC~~ SOLN
0.0000 [IU] | Freq: Three times a day (TID) | SUBCUTANEOUS | Status: DC
  Administered 2015-10-15: 3 [IU] via SUBCUTANEOUS
  Administered 2015-10-15 – 2015-10-20 (×6): 2 [IU] via SUBCUTANEOUS
  Administered 2015-10-20: 3 [IU] via SUBCUTANEOUS
  Administered 2015-10-21: 2 [IU] via SUBCUTANEOUS
  Filled 2015-10-14: qty 1
  Filled 2015-10-14: qty 2
  Filled 2015-10-14: qty 3
  Filled 2015-10-14 (×4): qty 2
  Filled 2015-10-14: qty 3

## 2015-10-14 NOTE — Progress Notes (Signed)
Patient provided Adv. Dir. education and will notify Chaplain when completed.

## 2015-10-14 NOTE — H&P (Signed)
[facility] [place] NAME: Ronald Miller    MR#:  [id number]  DATE OF BIRTH:  July 10, 1956  DATE OF ADMISSION:  10/13/2015  PRIMARY CARE PHYSICIAN: Pcp Not In System   REQUESTING/REFERRING PHYSICIAN:   CHIEF COMPLAINT:   Chief Complaint  Patient presents with  . Shortness of Breath    HISTORY OF PRESENT ILLNESS: Ronald Miller  is a 60 y.o. male with a known history of systolic heart failure, COPD, coronary artery disease, diabetes mellitus, hypertension presented to the emergency room with shortness of breath and fluid retention. He  says he has been retaining fluid for the last 1-2 weeks. He has edema in both the legs, scrotal edema and edema up to the waist. Patient says he has been compliant with his medication. Complains of orthopnea and paroxysmal nocturnal dyspnea. Also complains of exertional shortness of breath .No complaints of any chest pain. He does have fluid oozing out from his legs. No history of any fever or chills. No history of any sick contacts at home or any recent travel. Workup in the emergency room revealed that patient is in heart failure. Hospitalist service was consulted for further care.  PAST MEDICAL HISTORY:   Past Medical History  Diagnosis Date  . ASCVD (arteriosclerotic cardiovascular disease)   . Diabetes mellitus   . Hypertension   . History of cocaine abuse   . Tobacco abuse   . Depression   . Suicidal ideation   . Chronic systolic CHF (congestive heart failure) ([place])   . COPD (chronic obstructive pulmonary disease) ([place])   . Coronary artery disease     PAST SURGICAL HISTORY: Past Surgical History  Procedure Laterality Date  . Coronary artery bypass graft  2001  . Lumbar disc surgery  02/1999    Discectomy and fusion  . Vasectomy      Subsequent reversal  . Insert / replace / remove pacemaker    . Cardiac defibrillator placement    . Cholecystectomy N/A 08/30/2015    Procedure: LAPAROSCOPIC  CHOLECYSTECTOMY;  Surgeon: Hubbard Robinson, MD;  Location: [name];  Service: General;  Laterality: N/A;  . Cardiac defibrillator placement      SOCIAL HISTORY:  Social History  Substance Use Topics  . Smoking status: Former Smoker -- 1.50 packs/day for 23 years    Types: Cigarettes  . Smokeless tobacco: Never Used  . Alcohol Use: No    FAMILY HISTORY:  Family History  Problem Relation Age of Onset  . CAD Father   . Diabetes Father   . Heart failure Father     DRUG ALLERGIES:  Allergies  Allergen Reactions  . Codeine Nausea And Vomiting    REVIEW OF SYSTEMS:   CONSTITUTIONAL: No fever, has weakness.  EYES: No blurred or double vision.  EARS, NOSE, AND THROAT: No tinnitus or ear pain.  RESPIRATORY: No cough, shortness of breath present, no wheezing or hemoptysis.  CARDIOVASCULAR: No chest pain, has orthopnea, has edema of both legs up to waist. GASTROINTESTINAL: No nausea, vomiting, diarrhea or abdominal pain.  GENITOURINARY: No dysuria, hematuria.  ENDOCRINE: No polyuria, nocturia,  HEMATOLOGY: No anemia, easy bruising or bleeding SKIN: No rash.Has fluid oozing from the skin in the legs. MUSCULOSKELETAL: No joint pain or arthritis. Has pain in the extremities.  NEUROLOGIC: No tingling, numbness, weakness.  PSYCHIATRY: No anxiety or depression.   MEDICATIONS AT HOME:  Prior to Admission medications   Medication Sig Start Date End Date Taking?  Authorizing Provider  albuterol (PROVENTIL HFA;VENTOLIN HFA) 108 (90 BASE) MCG/ACT inhaler Inhale 2 puffs into the lungs every 6 (six) hours as needed for wheezing or shortness of breath.   Yes Historical Provider, MD  aspirin EC 81 MG tablet Take 81 mg by mouth daily.   Yes Historical Provider, MD  atorvastatin (LIPITOR) 20 MG tablet Take 20 mg by mouth every evening.   Yes Historical Provider, MD  baclofen (LIORESAL) 10 MG tablet Take 10 mg by mouth 3 (three) times daily as needed for muscle spasms.    Yes Historical  Provider, MD  benazepril (LOTENSIN) 5 MG tablet Take 1 tablet (5 mg total) by mouth daily. 09/02/15  Yes Vipul Manuella Ghazi, MD  escitalopram (LEXAPRO) 20 MG tablet Take 1 tablet (20 mg total) by mouth daily. 09/02/15  Yes Max Sane, MD  furosemide (LASIX) 40 MG tablet Take 1 tablet (40 mg total) by mouth 2 (two) times daily. Take an additional 40mg   In AM if weight over 4 lbs 09/02/15  Yes Vipul Manuella Ghazi, MD  insulin aspart (NOVOLOG) 100 UNIT/ML injection Inject 10 Units into the skin 3 (three) times daily before meals. Patient taking differently: Inject 15 Units into the skin 3 (three) times daily before meals.  09/02/15  Yes Vipul Manuella Ghazi, MD  insulin glargine (LANTUS) 100 UNIT/ML injection Inject 0.5 mLs (50 Units total) into the skin at bedtime. Patient taking differently: Inject 40-50 Units into the skin at bedtime. Dose dependent on blood glucose 09/02/15  Yes Vipul Manuella Ghazi, MD  metFORMIN (GLUCOPHAGE) 1000 MG tablet Take 1 tablet (1,000 mg total) by mouth 2 (two) times daily. 09/02/15  Yes Vipul Manuella Ghazi, MD  metolazone (ZAROXOLYN) 5 MG tablet Take 5 mg by mouth daily as needed (for 4-5 pound weight gain).   Yes Historical Provider, MD  metoprolol succinate (TOPROL-XL) 100 MG 24 hr tablet Take 1 tablet (100 mg total) by mouth daily. Take with or immediately following a meal. 09/02/15  Yes Vipul Manuella Ghazi, MD  potassium chloride SA (K-DUR,KLOR-CON) 20 MEQ tablet Take 20 mEq by mouth daily.   Yes Historical Provider, MD  aspirin 325 MG tablet Take 1 tablet (325 mg total) by mouth daily. Patient not taking: Reported on 10/13/2015 09/02/15   Max Sane, MD  oxyCODONE-acetaminophen (PERCOCET) 7.5-325 MG tablet Take 1 tablet by mouth every 8 (eight) hours as needed for severe pain. Patient not taking: Reported on 09/30/2015 09/01/15   Max Sane, MD  predniSONE (DELTASONE) 50 MG tablet Take 1 tablet (50 mg total) by mouth daily with breakfast. Patient not taking: Reported on 10/13/2015 09/30/15   Lavonia Drafts, MD   spironolactone (ALDACTONE) 50 MG tablet Take 1 tablet (50 mg total) by mouth daily. Patient not taking: Reported on 10/13/2015 09/02/15   Max Sane, MD      PHYSICAL EXAMINATION:   VITAL SIGNS: Blood pressure 121/76, pulse 96, temperature 97.5 F (36.4 C), temperature source Oral, resp. rate 25, height 5\' 9"  (1.753 m), weight 109.77 kg (242 lb), SpO2 94 %.  GENERAL:  60 y.o.-year-old obese  patient lying in the bed with no acute distress.  EYES: Pupils equal, round, reactive to light and accommodation. No scleral icterus. Extraocular muscles intact.  HEENT: Head atraumatic, normocephalic. Oropharynx and nasopharynx clear.  NECK:  Supple, has jugular venous distention. No thyroid enlargement, no tenderness.  LUNGS: Decereased breath sounds bilaterally, no wheezing, bilateral basal crepitations heard. No use of accessory muscles of respiration.  CARDIOVASCULAR: S1, S2 normal. No murmurs, rubs, or gallops.  ABDOMEN:  Soft, nontender, mild distension,ascites. Bowel sounds present. No organomegaly or mass. Scrotal edema noted. EXTREMITIES: 3+ pedal edema, nocyanosis, or clubbing.  NEUROLOGIC: Cranial nerves II through XII are intact. Muscle strength 5/5 in all extremities. Sensation intact. Gait not checked PSYCHIATRIC: The patient is alert and oriented x 3.  SKIN: No obvious rash, lesion, or ulcer. Has fluid oozing from lower extremities.  LABORATORY PANEL:   CBC  Recent Labs Lab 10/13/15 1830  WBC 10.7*  HGB 12.9*  HCT 39.4*  PLT 184  MCV 93.9  MCH 30.8  MCHC 32.8  RDW 16.1*   ------------------------------------------------------------------------------------------------------------------  Chemistries   Recent Labs Lab 10/13/15 1942  NA 139  K 3.5  CL 98*  CO2 35*  GLUCOSE 307*  BUN 26*  CREATININE 1.12  CALCIUM 8.9   ------------------------------------------------------------------------------------------------------------------ estimated creatinine clearance is  87.8 mL/min (by C-G formula based on Cr of 1.12). ------------------------------------------------------------------------------------------------------------------ No results for input(s): TSH, T4TOTAL, T3FREE, THYROIDAB in the last 72 hours.  Invalid input(s): FREET3   Coagulation profile No results for input(s): INR, PROTIME in the last 168 hours. ------------------------------------------------------------------------------------------------------------------- No results for input(s): DDIMER in the last 72 hours. -------------------------------------------------------------------------------------------------------------------  Cardiac Enzymes  Recent Labs Lab 10/13/15 1942  TROPONINI 0.06*   ------------------------------------------------------------------------------------------------------------------ Invalid input(s): POCBNP  ---------------------------------------------------------------------------------------------------------------  Urinalysis    Component Value Date/Time   COLORURINE Amber 04/09/2014 1140   APPEARANCEUR Clear 04/09/2014 1140   LABSPEC 1.026 04/09/2014 1140   PHURINE 5.0 04/09/2014 1140   GLUCOSEU Negative 04/09/2014 1140   HGBUR Negative 04/09/2014 1140   BILIRUBINUR Negative 04/09/2014 1140   KETONESUR Negative 04/09/2014 1140   PROTEINUR 30 mg/dL 04/09/2014 1140   NITRITE Negative 04/09/2014 1140   LEUKOCYTESUR Negative 04/09/2014 1140     RADIOLOGY: Dg Chest 2 View  10/13/2015  CLINICAL DATA:  Bilateral lower extremity edema for 2 days. History of congestive heart failure, hypertension and COPD. EXAM: CHEST  2 VIEW COMPARISON:  09/30/2015 radiographs.  CT 08/29/2015. FINDINGS: The left subclavian AICD leads appear unchanged within the right atrium, right ventricle and coronary sinus. There is stable cardiomegaly status post CABG. Chronic vascular congestion is similar to the prior study. There is no overt pulmonary edema, confluent airspace  opacity or pleural effusion. Mild thoracic spine degenerative changes are stable. IMPRESSION: Stable cardiomegaly and chronic vascular congestion. No overt pulmonary edema. Electronically Signed   By: Richardean Sale M.D.   On: 10/13/2015 19:02    EKG: Orders placed or performed during the hospital encounter of 10/13/15  . ED EKG within 10 minutes  . ED EKG within 10 minutes  . EKG 12-Lead  . EKG 12-Lead    IMPRESSION AND PLAN: 60 year old male patient history of systolic heart failure, artery disease, hypertension, COPD presented to the emergency room with difficulty breathing and fluid overload. Admitting diagnosis 1. Decompensated heart failure 2. Fluid overload and anasarca 3. Dyspnea secondary to heart failure 4. COPD 5. Elevated troponin could be secondary to CHF exacerbation Treatment plan 1. Admit patient to telemetry 2. Diurese patient with IV Lasix 3. Cycle troponin to rule out ischemia 4. Echocardiogram and cardiology consultation. 5.Doppler ultrasound of lower extremities. 6.Fluid restriction. All the records are reviewed and case discussed with ED provider. Management plans discussed with the patient, family and they are in agreement.  CODE STATUS:FULL    TOTAL TIME TAKING CARE OF THIS PATIENT: 52 minutes.    Saundra Shelling M.D on 10/14/2015 at 12:14 AM  Between 7am to 6pm - Pager - [phone]  After 6pm  go to [email or web address] - password [email or web address] [facility]  Office  [phone]  CC: Primary care physician; Pcp Not In System

## 2015-10-14 NOTE — Progress Notes (Signed)
Report from [facility]. Patient resting quietly with no complaints. No signs of distress. Will continue to monitor.

## 2015-10-14 NOTE — Progress Notes (Signed)
[facility] [place] NAME: Ronald Miller    MR#:  [id number]  DATE OF BIRTH:  04-25-1956  SUBJECTIVE:  CHIEF COMPLAINT:   Chief Complaint  Patient presents with  . Shortness of Breath     Came with worsening orthopnea, swelling on leg and SOB.  REVIEW OF SYSTEMS:   CONSTITUTIONAL: No fever, has weakness.  EYES: No blurred or double vision.  EARS, NOSE, AND THROAT: No tinnitus or ear pain.  RESPIRATORY: No cough, shortness of breath present, no wheezing or hemoptysis.  CARDIOVASCULAR: No chest pain, has orthopnea, has edema of both legs up to waist. GASTROINTESTINAL: No nausea, vomiting, diarrhea or abdominal pain.  GENITOURINARY: No dysuria, hematuria.  ENDOCRINE: No polyuria, nocturia,  HEMATOLOGY: No anemia, easy bruising or bleeding SKIN: No rash.Has fluid oozing from the skin in the legs. MUSCULOSKELETAL: No joint pain or arthritis. Has pain in the extremities.  NEUROLOGIC: No tingling, numbness, weakness.  PSYCHIATRY: No anxiety or depression.   ROS  DRUG ALLERGIES:   Allergies  Allergen Reactions  . Codeine Nausea And Vomiting    VITALS:  Blood pressure 105/68, pulse 68, temperature 98.1 F (36.7 C), temperature source Oral, resp. rate 18, height 5\' 9"  (1.753 m), weight 112.674 kg (248 lb 6.4 oz), SpO2 95 %.  PHYSICAL EXAMINATION:   GENERAL: 60 y.o.-year-old obese patient lying in the bed with no acute distress.  EYES: Pupils equal, round, reactive to light and accommodation. No scleral icterus. Extraocular muscles intact.  HEENT: Head atraumatic, normocephalic. Oropharynx and nasopharynx clear.  NECK: Supple, has jugular venous distention. No thyroid enlargement, no tenderness.  LUNGS: Decereased breath sounds bilaterally, no wheezing, bilateral basal crepitations heard. No use of accessory muscles of respiration.  CARDIOVASCULAR: S1, S2 normal. No murmurs, rubs, or gallops.  ABDOMEN: Soft,  nontender, mild distension,ascites. Bowel sounds present. No organomegaly or mass. Scrotal edema noted. EXTREMITIES: 3+ pedal edema, nocyanosis, or clubbing.  NEUROLOGIC: Cranial nerves II through XII are intact. Muscle strength 5/5 in all extremities. Sensation intact. Gait not checked PSYCHIATRIC: The patient is alert and oriented x 3.  SKIN: No obvious rash, lesion, or ulcer. Has fluid oozing from lower extremities.  Physical Exam LABORATORY PANEL:   CBC  Recent Labs Lab 10/14/15 0257  WBC 9.4  HGB 12.4*  HCT 37.8*  PLT 142*   ------------------------------------------------------------------------------------------------------------------  Chemistries   Recent Labs Lab 10/13/15 1942 10/14/15 0257  NA 139  --   K 3.5  --   CL 98*  --   CO2 35*  --   GLUCOSE 307*  --   BUN 26*  --   CREATININE 1.12 1.04  CALCIUM 8.9  --    ------------------------------------------------------------------------------------------------------------------  Cardiac Enzymes  Recent Labs Lab 10/14/15 0729 10/14/15 1404  TROPONINI 0.07* 0.07*   ------------------------------------------------------------------------------------------------------------------  RADIOLOGY:  Dg Chest 2 View  10/13/2015  CLINICAL DATA:  Bilateral lower extremity edema for 2 days. History of congestive heart failure, hypertension and COPD. EXAM: CHEST  2 VIEW COMPARISON:  09/30/2015 radiographs.  CT 08/29/2015. FINDINGS: The left subclavian AICD leads appear unchanged within the right atrium, right ventricle and coronary sinus. There is stable cardiomegaly status post CABG. Chronic vascular congestion is similar to the prior study. There is no overt pulmonary edema, confluent airspace opacity or pleural effusion. Mild thoracic spine degenerative changes are stable. IMPRESSION: Stable cardiomegaly and chronic vascular congestion. No overt pulmonary edema. Electronically Signed   By: Richardean Sale M.D.   On:  10/13/2015 19:02   [place] Extrem Low Right Comp  10/14/2015  CLINICAL DATA:  60 year old male with right leg pain and swelling EXAM: Right LOWER EXTREMITY VENOUS DOPPLER ULTRASOUND TECHNIQUE: Gray-scale sonography with graded compression, as well as color Doppler and duplex ultrasound were performed to evaluate the lower extremity deep venous systems from the level of the common femoral vein and including the common femoral, femoral, profunda femoral, popliteal and calf veins including the posterior tibial, peroneal and gastrocnemius veins when visible. The superficial great saphenous vein was also interrogated. Spectral Doppler was utilized to evaluate flow at rest and with distal augmentation maneuvers in the common femoral, femoral and popliteal veins. COMPARISON:  Ultrasound dated 02/08/2005 FINDINGS: Contralateral Common Femoral Vein: Respiratory phasicity is normal and symmetric with the symptomatic side. No evidence of thrombus. Normal compressibility. Common Femoral Vein: No evidence of thrombus. Normal compressibility, respiratory phasicity and response to augmentation. Saphenofemoral Junction: No evidence of thrombus. Normal compressibility and flow on color Doppler imaging. Profunda Femoral Vein: No evidence of thrombus. Normal compressibility and flow on color Doppler imaging. Femoral Vein: No evidence of thrombus. Normal compressibility, respiratory phasicity and response to augmentation. Popliteal Vein: No evidence of thrombus. Normal compressibility, respiratory phasicity and response to augmentation. Calf Veins: No evidence of thrombus. Normal compressibility and flow on color Doppler imaging. Superficial Great Saphenous Vein: No evidence of thrombus. Normal compressibility and flow on color Doppler imaging. IMPRESSION: No evidence of deep venous thrombosis in the right lower extremity. Electronically Signed   By: Anner Crete M.D.   On: 10/14/2015 01:00    ASSESSMENT AND PLAN:   Principal  Problem:   Dyspnea Active Problems:   Fluid overload  * ac on ch systolic Heart failure   IV lasix,monitor renal function.   Appreciated cardiology help.   On ASA, enalapril.  * elevated troponin due to CHF   Monitor on telee   Cardiology on case  * DM   Metformin and lantus, with sliding scale.  * COPD   No acute wheezing.    All the records are reviewed and case discussed with Care Management/Social Workerr. Management plans discussed with the patient, family and they are in agreement.  CODE STATUS: Full.  TOTAL TIME TAKING CARE OF THIS PATIENT: 35 minutes.   POSSIBLE D/C IN 1-2 DAYS, DEPENDING ON CLINICAL CONDITION.   Vaughan Basta M.D on 10/14/2015   Between 7am to 6pm - Pager - [phone]  After 6pm go to [email or web address] - password [email or web address] [facility]  Office  [phone]  CC: Primary care physician; Pcp Not In System  Note: This dictation was prepared with Dragon dictation along with smaller phrase technology. Any transcriptional errors that result from this process are unintentional.

## 2015-10-14 NOTE — Progress Notes (Signed)
*  PRELIMINARY RESULTS* Echocardiogram 2D Echocardiogram has been performed.  Ronald Miller 10/14/2015, 1:26 PM

## 2015-10-14 NOTE — Consult Note (Signed)
Reason for Consult: leg edema congestive heart failure shortness of breath Referring Physician:  [facility] Dr. Shellee Milo D Nain Rudd. is an 60 y.o. male.  HPI:  Known history of congestive heart failure systolic dysfunction cardiomyopathy coronary disease COPD diabetes hypertension obesity states he complains of worsening shortness of breath and fluid retention and leg edema . The patient complains of PND orthopnea leg and scrotal area and. Patient states to be compliant with medication still has orthopnea PND progressive shortness of breath denies much in a way pain here for evaluation was here.  Patient complains of pain in the right leg.  The patient has a history of inhalers but does aneurysm often. He was most recently admitted about a month ago after AICD discharge. He was treated and released has not followed up with Cardiology does not go to the Heart failure Clinic regularly now here with worsening fluid retention and heart failure  Past Medical History  Diagnosis Date  . ASCVD (arteriosclerotic cardiovascular disease)   . Diabetes mellitus   . Hypertension   . History of cocaine abuse   . Tobacco abuse   . Depression   . Suicidal ideation   . Chronic systolic CHF (congestive heart failure) ([place])   . COPD (chronic obstructive pulmonary disease) ([place])   . Coronary artery disease     Past Surgical History  Procedure Laterality Date  . Coronary artery bypass graft  2001  . Lumbar disc surgery  02/1999    Discectomy and fusion  . Vasectomy      Subsequent reversal  . Insert / replace / remove pacemaker    . Cardiac defibrillator placement    . Cholecystectomy N/A 08/30/2015    Procedure: LAPAROSCOPIC CHOLECYSTECTOMY;  Surgeon: Hubbard Robinson, MD;  Location: [name];  Service: General;  Laterality: N/A;  . Cardiac defibrillator placement      Family History  Problem Relation Age of Onset  . CAD Father   . Diabetes Father   . Heart failure Father      Social History:  reports that he has quit smoking. His smoking use included Cigarettes. He has a 34.5 pack-year smoking history. He has never used smokeless tobacco. He reports that he does not drink alcohol or use illicit drugs.  Allergies:  Allergies  Allergen Reactions  . Codeine Nausea And Vomiting    Medications: I have reviewed the patient's current medications.  Results for orders placed or performed during the hospital encounter of 10/13/15 (from the past 48 hour(s))  CBC     Status: Abnormal   Collection Time: 10/13/15  6:30 PM  Result Value Ref Range   WBC 10.7 (H) 3.8 - 10.6 K/uL   RBC 4.20 (L) 4.40 - 5.90 MIL/uL   Hemoglobin 12.9 (L) 13.0 - 18.0 g/dL   HCT 39.4 (L) 40.0 - 52.0 %   MCV 93.9 80.0 - 100.0 fL   MCH 30.8 26.0 - 34.0 pg   MCHC 32.8 32.0 - 36.0 g/dL   RDW 16.1 (H) 11.5 - 14.5 %   Platelets 184 150 - 440 K/uL  Basic metabolic panel     Status: Abnormal   Collection Time: 10/13/15  7:42 PM  Result Value Ref Range   Sodium 139 135 - 145 mmol/L   Potassium 3.5 3.5 - 5.1 mmol/L   Chloride 98 (L) 101 - 111 mmol/L   CO2 35 (H) 22 - 32 mmol/L   Glucose, Bld 307 (H) 65 - 99 mg/dL  BUN 26 (H) 6 - 20 mg/dL   Creatinine, Ser 1.12 0.61 - 1.24 mg/dL   Calcium 8.9 8.9 - 10.3 mg/dL   GFR calc non Af Amer >60 >60 mL/min   GFR calc Af Amer >60 >60 mL/min    Comment: (NOTE) The eGFR has been calculated using the CKD EPI equation. This calculation has not been validated in all clinical situations. eGFR's persistently <60 mL/min signify possible Chronic Kidney Disease.    Anion gap 6 5 - 15  Troponin I     Status: Abnormal   Collection Time: 10/13/15  7:42 PM  Result Value Ref Range   Troponin I 0.06 (H) <0.031 ng/mL    Comment: READ BACK AND VERIFIED WITH STEVE SNIDER AT 2058 10/13/2015 BY TFK        PERSISTENTLY INCREASED TROPONIN VALUES IN THE RANGE OF 0.04-0.49 ng/mL CAN BE SEEN IN:       -UNSTABLE ANGINA       -CONGESTIVE HEART FAILURE        -MYOCARDITIS       -CHEST TRAUMA       -ARRYHTHMIAS       -LATE PRESENTING MYOCARDIAL INFARCTION       -COPD   CLINICAL FOLLOW-UP RECOMMENDED.   Brain natriuretic peptide     Status: Abnormal   Collection Time: 10/13/15  7:42 PM  Result Value Ref Range   B Natriuretic Peptide 2776.0 (H) 0.0 - 100.0 pg/mL  Glucose, capillary     Status: Abnormal   Collection Time: 10/14/15  1:39 AM  Result Value Ref Range   Glucose-Capillary 239 (H) 65 - 99 mg/dL  MRSA PCR Screening     Status: None   Collection Time: 10/14/15  2:05 AM  Result Value Ref Range   MRSA by PCR NEGATIVE NEGATIVE    Comment:        The GeneXpert MRSA Assay (FDA approved for NASAL specimens only), is one component of a comprehensive MRSA colonization surveillance program. It is not intended to diagnose MRSA infection nor to guide or monitor treatment for MRSA infections.   Troponin I     Status: Abnormal   Collection Time: 10/14/15  2:57 AM  Result Value Ref Range   Troponin I 0.08 (H) <0.031 ng/mL    Comment: PREVIOUS RESULT CALLED @ 2058 10/13/15 BY TFK. 10/14/15 0335 SJL        PERSISTENTLY INCREASED TROPONIN VALUES IN THE RANGE OF 0.04-0.49 ng/mL CAN BE SEEN IN:       -UNSTABLE ANGINA       -CONGESTIVE HEART FAILURE       -MYOCARDITIS       -CHEST TRAUMA       -ARRYHTHMIAS       -LATE PRESENTING MYOCARDIAL INFARCTION       -COPD   CLINICAL FOLLOW-UP RECOMMENDED.   CBC     Status: Abnormal   Collection Time: 10/14/15  2:57 AM  Result Value Ref Range   WBC 9.4 3.8 - 10.6 K/uL   RBC 4.11 (L) 4.40 - 5.90 MIL/uL   Hemoglobin 12.4 (L) 13.0 - 18.0 g/dL   HCT 37.8 (L) 40.0 - 52.0 %   MCV 91.9 80.0 - 100.0 fL   MCH 30.1 26.0 - 34.0 pg   MCHC 32.8 32.0 - 36.0 g/dL   RDW 15.9 (H) 11.5 - 14.5 %   Platelets 142 (L) 150 - 440 K/uL  Creatinine, serum     Status: None   Collection Time: 10/14/15  2:57 AM  Result Value Ref Range   Creatinine, Ser 1.04 0.61 - 1.24 mg/dL   GFR calc non Af Amer >60 >60  mL/min   GFR calc Af Amer >60 >60 mL/min    Comment: (NOTE) The eGFR has been calculated using the CKD EPI equation. This calculation has not been validated in all clinical situations. eGFR's persistently <60 mL/min signify possible Chronic Kidney Disease.   Troponin I     Status: Abnormal   Collection Time: 10/14/15  7:29 AM  Result Value Ref Range   Troponin I 0.07 (H) <0.031 ng/mL    Comment: PREVIOUS RESULT CALLED 10/13/15 AT 2058 BY TFK/DAS        PERSISTENTLY INCREASED TROPONIN VALUES IN THE RANGE OF 0.04-0.49 ng/mL CAN BE SEEN IN:       -UNSTABLE ANGINA       -CONGESTIVE HEART FAILURE       -MYOCARDITIS       -CHEST TRAUMA       -ARRYHTHMIAS       -LATE PRESENTING MYOCARDIAL INFARCTION       -COPD   CLINICAL FOLLOW-UP RECOMMENDED.     Dg Chest 2 View  10/13/2015  CLINICAL DATA:  Bilateral lower extremity edema for 2 days. History of congestive heart failure, hypertension and COPD. EXAM: CHEST  2 VIEW COMPARISON:  09/30/2015 radiographs.  CT 08/29/2015. FINDINGS: The left subclavian AICD leads appear unchanged within the right atrium, right ventricle and coronary sinus. There is stable cardiomegaly status post CABG. Chronic vascular congestion is similar to the prior study. There is no overt pulmonary edema, confluent airspace opacity or pleural effusion. Mild thoracic spine degenerative changes are stable. IMPRESSION: Stable cardiomegaly and chronic vascular congestion. No overt pulmonary edema. Electronically Signed   By: Richardean Sale M.D.   On: 10/13/2015 19:02   [place] Extrem Low Right Comp  10/14/2015  CLINICAL DATA:  60 year old male with right leg pain and swelling EXAM: Right LOWER EXTREMITY VENOUS DOPPLER ULTRASOUND TECHNIQUE: Gray-scale sonography with graded compression, as well as color Doppler and duplex ultrasound were performed to evaluate the lower extremity deep venous systems from the level of the common femoral vein and including the common femoral, femoral,  profunda femoral, popliteal and calf veins including the posterior tibial, peroneal and gastrocnemius veins when visible. The superficial great saphenous vein was also interrogated. Spectral Doppler was utilized to evaluate flow at rest and with distal augmentation maneuvers in the common femoral, femoral and popliteal veins. COMPARISON:  Ultrasound dated 02/08/2005 FINDINGS: Contralateral Common Femoral Vein: Respiratory phasicity is normal and symmetric with the symptomatic side. No evidence of thrombus. Normal compressibility. Common Femoral Vein: No evidence of thrombus. Normal compressibility, respiratory phasicity and response to augmentation. Saphenofemoral Junction: No evidence of thrombus. Normal compressibility and flow on color Doppler imaging. Profunda Femoral Vein: No evidence of thrombus. Normal compressibility and flow on color Doppler imaging. Femoral Vein: No evidence of thrombus. Normal compressibility, respiratory phasicity and response to augmentation. Popliteal Vein: No evidence of thrombus. Normal compressibility, respiratory phasicity and response to augmentation. Calf Veins: No evidence of thrombus. Normal compressibility and flow on color Doppler imaging. Superficial Great Saphenous Vein: No evidence of thrombus. Normal compressibility and flow on color Doppler imaging. IMPRESSION: No evidence of deep venous thrombosis in the right lower extremity. Electronically Signed   By: Anner Crete M.D.   On: 10/14/2015 01:00    Review of Systems  Constitutional: Positive for malaise/fatigue.  HENT: Positive for congestion.   Eyes: Negative.  Respiratory: Positive for shortness of breath.   Cardiovascular: Positive for orthopnea, leg swelling and PND.  Gastrointestinal: Negative.   Genitourinary: Negative.   Musculoskeletal: Positive for myalgias.  Skin: Negative.   Neurological: Positive for weakness.  Endo/Heme/Allergies: Negative.   Psychiatric/Behavioral: Negative.    Blood  pressure 107/77, pulse 89, temperature 97.8 F (36.6 C), temperature source Oral, resp. rate 18, height _0  (1.753 m), weight 112.674 kg (248 lb 6.4 oz), SpO2 94 %. Physical Exam  Constitutional: He appears well-developed and well-nourished.  HENT:  Head: Normocephalic and atraumatic.  Eyes: Conjunctivae and EOM are normal. Pupils are equal, round, and reactive to light.  Neck: Normal range of motion. Neck supple.  Cardiovascular: Regular rhythm, S1 normal, S2 normal, intact distal pulses and normal pulses.  Exam reveals gallop and S3.   Murmur heard.  Systolic murmur is present with a grade of 2/6    Assessment/Plan:  edema  congestive heart failure and  cardiomyopathy systolic dysfunction  congestive heart failure  diabetes  hypertension  smoking  COPD  arteriosclerotic vascular disease  depression  obesity  AICD permanent pacemaker . PLAN  agree with admission  supplemental oxygen therapy  recommend diuretic therapy  With Lasix and metolazone may increase dose  continue metformin therapy for diabetes  And  insulin  enalapril metoprolol diuretics for heart failure  continue Lipitor therapy for lipid management  depression continue Lexapro for  recommend dietary changes reducing salt intake  continue aspirin for arteriosclerotic vascular disease  agree with supplemental potassium as needed   CALLWOOD,DWAYNE D. 10/14/2015, 11:06 AM

## 2015-10-14 NOTE — Care Management (Addendum)
Patient presents to ED with shortness of breath.  Is on chronic oral lasix at home and attempts to increase his lasix did not resolve patient's symptoms.  He also has history of copd but does not appear to have an exacerbation of this.   His room air sats are 92 and above.  Patient is on fluid restriction. Within the last 6 months, he has present to the ED once and 2 admissions and not necessarily related to heart failure. discussed the need to maintain mobility and assess for need of home 02 prior to discharge. Not a heart failure vest candidate as BM ? 35.8.  Staff will investigate

## 2015-10-14 NOTE — Progress Notes (Addendum)
Inpatient Diabetes Program Recommendations  AACE/ADA: New Consensus Statement on Inpatient Glycemic Control (2015)  Target Ranges:  Prepandial:   less than 140 mg/dL      Peak postprandial:   less than 180 mg/dL (1-2 hours)      Critically ill patients:  140 - 180 mg/dL    Results for Ronald Miller, Ronald Miller (MRN [id number]) as of 10/14/2015 12:01  Ref. Range 10/14/2015 01:39  Glucose-Capillary Latest Ref Range: 65-99 mg/dL 239 (H)     Admit with: SOB  History: DM, CHF, COPD  Home DM Meds: Lantus 40-50 units QHS       Novolog 15 units tidwc       Metformin 1000 mg bid  Current Insulin Orders: Lantus 50 units QHS      Novolog 10 units tidwc      Metformin 1000 mg bid     MD- Please start Novolog Moderate SSI (0-15 units) TID AC + HS   [Use Glycemic Control Order set]    --Will follow patient during hospitalization--  Wyn Quaker RN, MSN, CDE Diabetes Coordinator Inpatient Glycemic Control Team Team Pager: [phone] (8a-5p)

## 2015-10-15 DIAGNOSIS — L8990 Pressure ulcer of unspecified site, unspecified stage: Secondary | ICD-10-CM | POA: Insufficient documentation

## 2015-10-15 LAB — BASIC METABOLIC PANEL
Anion gap: 3 — ABNORMAL LOW (ref 5–15)
BUN: 26 mg/dL — ABNORMAL HIGH (ref 6–20)
CHLORIDE: 99 mmol/L — AB (ref 101–111)
CO2: 35 mmol/L — ABNORMAL HIGH (ref 22–32)
Calcium: 8.4 mg/dL — ABNORMAL LOW (ref 8.9–10.3)
Creatinine, Ser: 1.16 mg/dL (ref 0.61–1.24)
GFR calc non Af Amer: >60 mL/min (ref >60)
Glucose, Bld: 113 mg/dL — ABNORMAL HIGH (ref 65–99)
POTASSIUM: 3.4 mmol/L — AB (ref 3.5–5.1)
SODIUM: 137 mmol/L (ref 135–145)

## 2015-10-15 LAB — GLUCOSE, CAPILLARY
GLUCOSE-CAPILLARY: 89 mg/dL (ref 65–99)
GLUCOSE-CAPILLARY: 154 mg/dL — AB (ref 65–99)
GLUCOSE-CAPILLARY: 224 mg/dL — AB (ref 65–99)
Glucose-Capillary: 131 mg/dL — ABNORMAL HIGH (ref 65–99)
Glucose-Capillary: 52 mg/dL — ABNORMAL LOW (ref 65–99)

## 2015-10-15 MED ORDER — INSULIN GLARGINE 100 UNIT/ML ~~LOC~~ SOLN
40.0000 [IU] | Freq: Every day | SUBCUTANEOUS | Status: DC
  Administered 2015-10-15 – 2015-10-20 (×6): 40 [IU] via SUBCUTANEOUS
  Filled 2015-10-15 (×7): qty 0.4

## 2015-10-15 MED ORDER — POTASSIUM CHLORIDE CRYS ER 20 MEQ PO TBCR
20.0000 meq | EXTENDED_RELEASE_TABLET | Freq: Two times a day (BID) | ORAL | Status: DC
  Administered 2015-10-15 – 2015-10-20 (×10): 20 meq via ORAL
  Filled 2015-10-15 (×10): qty 1

## 2015-10-15 NOTE — Progress Notes (Signed)
Ronald Miller at [place] NAME: Ronald Miller    MR#:  [id number]  DATE OF BIRTH:  1956/03/04  SUBJECTIVE:  CHIEF COMPLAINT:   Chief Complaint  Patient presents with  . Shortness of Breath     Came with worsening orthopnea, swelling on leg and SOB.   Had good diuresis so far and feels better today. Still have significant swelling on legs.  REVIEW OF SYSTEMS:   CONSTITUTIONAL: No fever, has weakness.  EYES: No blurred or double vision.  EARS, NOSE, AND THROAT: No tinnitus or ear pain.  RESPIRATORY: No cough, shortness of breath present, no wheezing or hemoptysis.  CARDIOVASCULAR: No chest pain, has orthopnea, has edema of both legs up to waist. GASTROINTESTINAL: No nausea, vomiting, diarrhea or abdominal pain.  GENITOURINARY: No dysuria, hematuria.  ENDOCRINE: No polyuria, nocturia,  HEMATOLOGY: No anemia, easy bruising or bleeding SKIN: No rash.Has fluid oozing from the skin in the legs. MUSCULOSKELETAL: No joint pain or arthritis. Has pain in the extremities.  NEUROLOGIC: No tingling, numbness, weakness.  PSYCHIATRY: No anxiety or depression.   ROS  DRUG ALLERGIES:   Allergies  Allergen Reactions  . Codeine Nausea And Vomiting    VITALS:  Blood pressure 95/65, pulse 73, temperature 98.2 F (36.8 C), temperature source Oral, resp. rate 22, height 5\' 9"  (1.753 m), weight 113.626 kg (250 lb 8 oz), SpO2 96 %.  PHYSICAL EXAMINATION:   GENERAL: 60 y.o.-year-old obese patient lying in the bed with no acute distress.  EYES: Pupils equal, round, reactive to light and accommodation. No scleral icterus. Extraocular muscles intact.  HEENT: Head atraumatic, normocephalic. Oropharynx and nasopharynx clear.  NECK: Supple, has jugular venous distention. No thyroid enlargement, no tenderness.  LUNGS: Decereased breath sounds bilaterally, no wheezing, bilateral basal crepitations heard. No use of accessory muscles of  respiration.  CARDIOVASCULAR: S1, S2 normal. No murmurs, rubs, or gallops.  ABDOMEN: Soft, nontender, mild distension,ascites. Bowel sounds present. No organomegaly or mass. Scrotal edema noted. EXTREMITIES: 3+ pedal edema, nocyanosis, or clubbing.  NEUROLOGIC: Cranial nerves II through XII are intact. Muscle strength 5/5 in all extremities. Sensation intact. Gait not checked PSYCHIATRIC: The patient is alert and oriented x 3.  SKIN: No obvious rash, lesion, or ulcer. Has fluid oozing from lower extremities.  Physical Exam LABORATORY PANEL:   CBC  Recent Labs Lab 10/14/15 0257  WBC 9.4  HGB 12.4*  HCT 37.8*  PLT 142*   ------------------------------------------------------------------------------------------------------------------  Chemistries   Recent Labs Lab 10/15/15 0538  NA 137  K 3.4*  CL 99*  CO2 35*  GLUCOSE 113*  BUN 26*  CREATININE 1.16  CALCIUM 8.4*   ------------------------------------------------------------------------------------------------------------------  Cardiac Enzymes  Recent Labs Lab 10/14/15 0729 10/14/15 1404  TROPONINI 0.07* 0.07*   ------------------------------------------------------------------------------------------------------------------  RADIOLOGY:  [place] Extrem Low Right Comp  10/14/2015  CLINICAL DATA:  60 year old male with right leg pain and swelling EXAM: Right LOWER EXTREMITY VENOUS DOPPLER ULTRASOUND TECHNIQUE: Gray-scale sonography with graded compression, as well as color Doppler and duplex ultrasound were performed to evaluate the lower extremity deep venous systems from the level of the common femoral vein and including the common femoral, femoral, profunda femoral, popliteal and calf veins including the posterior tibial, peroneal and gastrocnemius veins when visible. The superficial great saphenous vein was also interrogated. Spectral Doppler was utilized to evaluate flow at rest and with distal augmentation  maneuvers in the common femoral, femoral and popliteal veins. COMPARISON:  Ultrasound dated 02/08/2005 FINDINGS: Contralateral Common  Femoral Vein: Respiratory phasicity is normal and symmetric with the symptomatic side. No evidence of thrombus. Normal compressibility. Common Femoral Vein: No evidence of thrombus. Normal compressibility, respiratory phasicity and response to augmentation. Saphenofemoral Junction: No evidence of thrombus. Normal compressibility and flow on color Doppler imaging. Profunda Femoral Vein: No evidence of thrombus. Normal compressibility and flow on color Doppler imaging. Femoral Vein: No evidence of thrombus. Normal compressibility, respiratory phasicity and response to augmentation. Popliteal Vein: No evidence of thrombus. Normal compressibility, respiratory phasicity and response to augmentation. Calf Veins: No evidence of thrombus. Normal compressibility and flow on color Doppler imaging. Superficial Great Saphenous Vein: No evidence of thrombus. Normal compressibility and flow on color Doppler imaging. IMPRESSION: No evidence of deep venous thrombosis in the right lower extremity. Electronically Signed   By: Anner Crete M.D.   On: 10/14/2015 01:00    ASSESSMENT AND PLAN:   Principal Problem:   Dyspnea Active Problems:   Fluid overload   Pressure ulcer  * ac on ch systolic Heart failure   IV lasix,monitor renal function.   Appreciated cardiology help.   On ASA, enalapril.   i councelled in detail about fluid and salt restriction and daily weight.   Also insisted regular follow ups at CHF clinic.  * elevated troponin due to CHF   Monitor on tele   Cardiology on case  * DM   Metformin and lantus, with sliding scale.  * COPD   No acute wheezing.  * hyperlipidemia   Cont atorvastatin.  All the records are reviewed and case discussed with Care Management/Social Workerr. Management plans discussed with the patient, family and they are in  agreement.  CODE STATUS: Full.  TOTAL TIME TAKING CARE OF THIS PATIENT: 35 minutes.   POSSIBLE D/C IN 1-2 DAYS, DEPENDING ON CLINICAL CONDITION.   Vaughan Basta M.D on 10/15/2015   Between 7am to 6pm - Pager - [phone]  After 6pm go to [email or web address] - password [email or web address] [facility]  Office  [phone]  CC: Primary care physician; Pcp Not In System  Note: This dictation was prepared with Dragon dictation along with smaller phrase technology. Any transcriptional errors that result from this process are unintentional.

## 2015-10-15 NOTE — Progress Notes (Signed)
Initial appointment made at the [facility] on November 06, 2015 at 9:00am. Thank you.

## 2015-10-16 LAB — GLUCOSE, CAPILLARY
GLUCOSE-CAPILLARY: 112 mg/dL — AB (ref 65–99)
Glucose-Capillary: 80 mg/dL (ref 65–99)
Glucose-Capillary: 129 mg/dL — ABNORMAL HIGH (ref 65–99)
Glucose-Capillary: 196 mg/dL — ABNORMAL HIGH (ref 65–99)

## 2015-10-16 LAB — BASIC METABOLIC PANEL
Anion gap: 7 (ref 5–15)
BUN: 31 mg/dL — ABNORMAL HIGH (ref 6–20)
CALCIUM: 8.4 mg/dL — AB (ref 8.9–10.3)
CHLORIDE: 97 mmol/L — AB (ref 101–111)
CO2: 33 mmol/L — ABNORMAL HIGH (ref 22–32)
CREATININE: 1.13 mg/dL (ref 0.61–1.24)
GFR calc non Af Amer: >60 mL/min (ref >60)
Glucose, Bld: 148 mg/dL — ABNORMAL HIGH (ref 65–99)
Potassium: 3.7 mmol/L (ref 3.5–5.1)
SODIUM: 137 mmol/L (ref 135–145)

## 2015-10-16 MED ORDER — DEXTROSE 5 % IV SOLN
2.0000 g | Freq: Once | INTRAVENOUS | Status: AC
  Administered 2015-10-16: 2 g via INTRAVENOUS
  Filled 2015-10-16: qty 2

## 2015-10-16 MED ORDER — SENNA 8.6 MG PO TABS
1.0000 | ORAL_TABLET | Freq: Two times a day (BID) | ORAL | Status: DC
  Administered 2015-10-16 – 2015-10-21 (×10): 8.6 mg via ORAL
  Filled 2015-10-16 (×11): qty 1

## 2015-10-16 MED ORDER — DEXTROSE 5 % IV SOLN
2.0000 g | INTRAVENOUS | Status: DC
  Filled 2015-10-16: qty 2

## 2015-10-16 MED ORDER — DIPHENHYDRAMINE HCL 25 MG PO CAPS
25.0000 mg | ORAL_CAPSULE | Freq: Every evening | ORAL | Status: DC | PRN
  Administered 2015-10-16 – 2015-10-20 (×4): 25 mg via ORAL
  Filled 2015-10-16 (×4): qty 1

## 2015-10-16 NOTE — Care Management (Signed)
During progression discussed the need to maintain mobility and assess for need of home 02

## 2015-10-16 NOTE — Care Management Important Message (Signed)
Important Message  Patient Details  Name: Ronald Miller. MRN: [id number] Date of Birth: September 08, 1956   Medicare Important Message Given:  Yes    [name] 10/16/2015, 2:38 PM

## 2015-10-16 NOTE — Progress Notes (Signed)
MD notified. Pts CBG is 80. Order to hold SSI and hold this dose of 10 units of scheduled insulin. Pt also complains of constipation. MD will place orders. I will continue to assess.

## 2015-10-16 NOTE — Progress Notes (Signed)
[facility] [place] NAME: Ronald Miller    MR#:  [id number]  DATE OF BIRTH:  20-Mar-1956  SUBJECTIVE:  CHIEF COMPLAINT:   Chief Complaint  Patient presents with  . Shortness of Breath     Came with worsening orthopnea, swelling on leg and SOB.   Today patient is complaining of right leg redness and significant weight gain of approximately 15 pounds. Denies any worsening of shortness of breath  REVIEW OF SYSTEMS:   CONSTITUTIONAL: No fever, has weakness.  EYES: No blurred or double vision.  EARS, NOSE, AND THROAT: No tinnitus or ear pain.  RESPIRATORY: No cough, has shortness of breath , no wheezing or hemoptysis.  CARDIOVASCULAR: No chest pain, has orthopnea, has edema of both legs up to waist. GASTROINTESTINAL: No nausea, vomiting, diarrhea or abdominal pain.  GENITOURINARY: No dysuria, hematuria.  ENDOCRINE: No polyuria, nocturia,  HEMATOLOGY: No anemia, easy bruising or bleeding SKIN: No rash.Has fluid oozing from the skin in the legs. MUSCULOSKELETAL: No joint pain or arthritis. Reporting right leg redness Has pain in the extremities.  NEUROLOGIC: No tingling, numbness, weakness.  PSYCHIATRY: No anxiety or depression.   ROS  DRUG ALLERGIES:   Allergies  Allergen Reactions  . Codeine Nausea And Vomiting    VITALS:  Blood pressure 98/69, pulse 72, temperature 97.8 F (36.6 C), temperature source Oral, resp. rate 18, height 5\' 9"  (1.753 m), weight 120.611 kg (265 lb 14.4 oz), SpO2 94 %.  PHYSICAL EXAMINATION:   GENERAL: 60 y.o.-year-old obese patient lying in the bed with no acute distress.  EYES: Pupils equal, round, reactive to light and accommodation. No scleral icterus. Extraocular muscles intact.  HEENT: Head atraumatic, normocephalic. Oropharynx and nasopharynx clear.  NECK: Supple, has jugular venous distention. No thyroid enlargement, no tenderness.  LUNGS: Decereased breath sounds  bilaterally, no wheezing, bilateral basal crepitations heard. No use of accessory muscles of respiration.  CARDIOVASCULAR: S1, S2 normal. No murmurs, rubs, or gallops.  ABDOMEN: Soft, nontender, mild distension,ascites. Bowel sounds present. No organomegaly or mass. Scrotal edema noted. EXTREMITIES: Right lower extremity is erythematous. 3+ pedal edema, nocyanosis, or clubbing.  NEUROLOGIC: Cranial nerves II through XII are intact. Muscle strength 5/5 in all extremities. Sensation intact. Gait not checked PSYCHIATRIC: The patient is alert and oriented x 3.  SKIN: No obvious rash, lesion, or ulcer. Has fluid oozing from lower extremities.  Physical Exam LABORATORY PANEL:   CBC  Recent Labs Lab 10/14/15 0257  WBC 9.4  HGB 12.4*  HCT 37.8*  PLT 142*   ------------------------------------------------------------------------------------------------------------------  Chemistries   Recent Labs Lab 10/16/15 0439  NA 137  K 3.7  CL 97*  CO2 33*  GLUCOSE 148*  BUN 31*  CREATININE 1.13  CALCIUM 8.4*   ------------------------------------------------------------------------------------------------------------------  Cardiac Enzymes  Recent Labs Lab 10/14/15 0729 10/14/15 1404  TROPONINI 0.07* 0.07*   ------------------------------------------------------------------------------------------------------------------  RADIOLOGY:  No results found.  ASSESSMENT AND PLAN:   Principal Problem:   Dyspnea Active Problems:   Fluid overload   Pressure ulcer  * ac on ch systolic Heart failure with history of cardiomyopathy   Continue 60 mg IV lasix every 12 hours, monitor renal function. Strict weight monitoring Will consider increasing the Zaroxolyn dose if patient persistently gains weight  Appreciated cardiology recommendations, we will follow up with them   On ASA, enalapril.   i councelled in detail about fluid and salt restriction and daily weight.   Also insisted  regular follow ups at  CHF clinic.  *Right lower extremity cellulitis Start the patient on IV Rocephin We will get I right lower extremity venous Dopplers to rule out DVT  * elevated troponin due to CHF   Monitor on tele   Cardiology on case  * DM   Metformin and lantus, with sliding scale.  * COPD   No acute wheezing.  * hyperlipidemia   Cont atorvastatin.  All the records are reviewed and case discussed with Care Management/Social Workerr. Management plans discussed with the patient, family and they are in agreement.  CODE STATUS: Full.  TOTAL TIME TAKING CARE OF THIS PATIENT: 35 minutes.   POSSIBLE D/C IN 1-2 DAYS, DEPENDING ON CLINICAL CONDITION.   Nicholes Mango M.D on 10/16/2015   Between 7am to 6pm - Pager - [phone]  After 6pm go to [email or web address] - password [email or web address] [facility]  Office  [phone]  CC: Primary care physician; Pcp Not In System  Note: This dictation was prepared with Dragon dictation along with smaller phrase technology. Any transcriptional errors that result from this process are unintentional.

## 2015-10-17 LAB — BASIC METABOLIC PANEL
Anion gap: 7 (ref 5–15)
BUN: 32 mg/dL — AB (ref 6–20)
CO2: 31 mmol/L (ref 22–32)
CREATININE: 1.16 mg/dL (ref 0.61–1.24)
Calcium: 8.1 mg/dL — ABNORMAL LOW (ref 8.9–10.3)
Chloride: 94 mmol/L — ABNORMAL LOW (ref 101–111)
GFR calc Af Amer: >60 mL/min (ref >60)
Glucose, Bld: 113 mg/dL — ABNORMAL HIGH (ref 65–99)
Potassium: 3.5 mmol/L (ref 3.5–5.1)
SODIUM: 132 mmol/L — AB (ref 135–145)

## 2015-10-17 LAB — GLUCOSE, CAPILLARY
GLUCOSE-CAPILLARY: 94 mg/dL (ref 65–99)
GLUCOSE-CAPILLARY: 134 mg/dL — AB (ref 65–99)
Glucose-Capillary: 113 mg/dL — ABNORMAL HIGH (ref 65–99)
Glucose-Capillary: 182 mg/dL — ABNORMAL HIGH (ref 65–99)

## 2015-10-17 MED ORDER — PIPERACILLIN-TAZOBACTAM 3.375 G IVPB 30 MIN
3.3750 g | Freq: Once | INTRAVENOUS | Status: DC
  Filled 2015-10-17: qty 50

## 2015-10-17 MED ORDER — METOLAZONE 2.5 MG PO TABS
5.0000 mg | ORAL_TABLET | Freq: Every day | ORAL | Status: DC
  Administered 2015-10-17 – 2015-10-21 (×5): 5 mg via ORAL
  Filled 2015-10-17 (×5): qty 2

## 2015-10-17 MED ORDER — VANCOMYCIN HCL 10 G IV SOLR
1250.0000 mg | INTRAVENOUS | Status: AC
  Administered 2015-10-17: 1250 mg via INTRAVENOUS
  Filled 2015-10-17: qty 1250

## 2015-10-17 MED ORDER — VANCOMYCIN HCL 10 G IV SOLR
2000.0000 mg | Freq: Once | INTRAVENOUS | Status: DC
  Filled 2015-10-17: qty 2000

## 2015-10-17 MED ORDER — PIPERACILLIN-TAZOBACTAM 4.5 G IVPB
4.5000 g | Freq: Three times a day (TID) | INTRAVENOUS | Status: DC
  Administered 2015-10-17 – 2015-10-19 (×6): 4.5 g via INTRAVENOUS
  Filled 2015-10-17 (×10): qty 100

## 2015-10-17 MED ORDER — VANCOMYCIN HCL 10 G IV SOLR
1250.0000 mg | Freq: Two times a day (BID) | INTRAVENOUS | Status: DC
  Administered 2015-10-17 – 2015-10-18 (×3): 1250 mg via INTRAVENOUS
  Filled 2015-10-17 (×5): qty 1250

## 2015-10-17 NOTE — Progress Notes (Signed)
ANTIBIOTIC CONSULT NOTE - INITIAL  Pharmacy Consult for Vancomycin/Zosyn Indication: Cellulitis  Allergies  Allergen Reactions  . Codeine Nausea And Vomiting    Patient Measurements: Height: 5\' 9"  (175.3 cm) Weight: 266 lb 1.6 oz (120.702 kg) IBW/kg (Calculated) : 70.7 Adjusted Body Weight: 90.7 kg  Vital Signs: Temp: 97.9 F (36.6 C) (01/13 1055) Temp Source: Oral (01/13 1055) BP: 93/70 mmHg (01/13 1055) Pulse Rate: 69 (01/13 1055) Intake/Output from previous day: 01/12 0701 - 01/13 0700 In: 1130 [P.O.:1080; IV Piggyback:50] Out: 1950 [id number] Intake/Output from this shift: Total I/O In: 840 [P.O.:840] Out: -   Labs:  Recent Labs  10/15/15 0538 10/16/15 0439 10/17/15 0530  CREATININE 1.16 1.13 1.16   Estimated Creatinine Clearance: 89 mL/min (by C-G formula based on Cr of 1.16). No results for input(s): VANCOTROUGH, VANCOPEAK, VANCORANDOM, GENTTROUGH, GENTPEAK, GENTRANDOM, TOBRATROUGH, TOBRAPEAK, TOBRARND, AMIKACINPEAK, AMIKACINTROU, AMIKACIN in the last 72 hours.   Microbiology: Recent Results (from the past 720 hour(s))  MRSA PCR Screening     Status: None   Collection Time: 10/14/15  2:05 AM  Result Value Ref Range Status   MRSA by PCR NEGATIVE NEGATIVE Final    Comment:        The GeneXpert MRSA Assay (FDA approved for NASAL specimens only), is one component of a comprehensive MRSA colonization surveillance program. It is not intended to diagnose MRSA infection nor to guide or monitor treatment for MRSA infections.     Medical History: Past Medical History  Diagnosis Date  . ASCVD (arteriosclerotic cardiovascular disease)   . Diabetes mellitus   . Hypertension   . History of cocaine abuse   . Tobacco abuse   . Depression   . Suicidal ideation   . Chronic systolic CHF (congestive heart failure) ([place])   . COPD (chronic obstructive pulmonary disease) ([place])   . Coronary artery disease     Medications:  Scheduled:  . aspirin EC  81 mg  Oral Daily  . atorvastatin  20 mg Oral QPM  . enoxaparin (LOVENOX) injection  40 mg Subcutaneous Q24H  . escitalopram  20 mg Oral Daily  . furosemide  60 mg Intravenous Q12H  . insulin aspart  0-15 Units Subcutaneous TID WC  . insulin aspart  0-5 Units Subcutaneous QHS  . insulin glargine  40 Units Subcutaneous QHS  . metFORMIN  1,000 mg Oral BID  . metolazone  5 mg Oral Daily  . metoprolol succinate  100 mg Oral Daily  . piperacillin-tazobactam (ZOSYN)  IV  4.5 g Intravenous 3 times per day  . potassium chloride SA  20 mEq Oral BID  . senna  1 tablet Oral BID  . sodium chloride  3 mL Intravenous Q12H  . vancomycin  1,250 mg Intravenous STAT  . vancomycin  1,250 mg Intravenous Q12H   Infusions:    Assessment: 60 y/o admitted with SOB and LE cellulitis.   Goal of Therapy:  Vancomycin trough level 10-15 mcg/ml  Plan:  Zosyn 4.5 g EI q 8 hours.   Vancomycin 1250 mg iv q 12 hours with stacked dosing and a trough with the 5th total dose.   Will continue to follow renal function and culture results.   Ulice Dash D 10/17/2015,3:36 PM

## 2015-10-17 NOTE — Progress Notes (Signed)
MD notified. Pts CBG is 113. Orders for scheduled 10 units. MD orders to discontinue scheduled insulin and follow SSI orders. MD also want diabetic coordinator to see patient.

## 2015-10-17 NOTE — Progress Notes (Signed)
Nutrition Education Note  RD consulted for nutrition education regarding new onset CHF.  RD provided "Heart Failure Nutrition Therapy" handout from the Academy of [employer] and [employer]etics. Reviewed patient's dietary recall. Provided examples on ways to decrease sodium intake in diet. Discouraged intake of processed foods and use of salt shaker.   RD discussed why it is important for patient to adhere to diet recommendations, and emphasized the role of fluids, foods to avoid, and importance of weighing self daily. Teach back method used.   RD also consulted for nutrition education regarding diabetes.   Lab Results  Component Value Date   HGBA1C 7.1* 08/28/2015    RD provided "Carbohydrate Counting for People with Diabetes" handout from the Academy of Nutrition and Dietetics. Discussed different food groups and their effects on blood sugar, emphasizing carbohydrate-containing foods. Provided list of carbohydrates and recommended serving sizes of common foods.  Discussed importance of controlled and consistent carbohydrate intake throughout the day. Provided examples of ways to balance meals/snacks and encouraged intake of high-fiber, whole grain complex carbohydrates. Teach back method used.  Expect good compliance.  Current diet order is heart healthy/carb modified, patient is consuming approximately 100% of meals at this time. Labs and medications reviewed. No further nutrition interventions warranted at this time. RD contact information provided. If additional nutrition issues arise, please re-consult RD.  Kerman Passey Burt, [place], LDN [phone] Pager  [phone] Weekend/On-Call Pager

## 2015-10-17 NOTE — Care Management (Signed)
Patient does not qualify for home 02.  He has an appointment set up with [facility].  Patient at present does not qualify for home health as he does not qualify for home bound criteria.  He would benefit for some additional outpatient diabetes follow up

## 2015-10-17 NOTE — Progress Notes (Signed)
Inpatient Diabetes Program Recommendations  AACE/ADA: New Consensus Statement on Inpatient Glycemic Control (2015)  Target Ranges:  Prepandial:   less than 140 mg/dL      Peak postprandial:   less than 180 mg/dL (1-2 hours)      Critically ill patients:  140 - 180 mg/dL   Review of Glycemic Control  Results for Ronald Miller, Ronald Miller (MRN [id number]) as of 10/17/2015 12:30  Ref. Range 10/16/2015 11:47 10/16/2015 16:05 10/16/2015 19:26 10/17/2015 07:25 10/17/2015 10:53  Glucose-Capillary Latest Ref Range: 65-99 mg/dL 80 129 (H) 196 (H) 94 113 (H)    Diabetes history: Type 2 Outpatient Diabetes medications: Novolog 10 units tid, Lantus 50 units qhs, Metformin 1000mg  bid Current orders for Inpatient glycemic control:  Lantus 40 units qhs, Metformin 1000mg  bid, Novolog 0-5 units qhs, Novolog 0-15 units tid  Inpatient Diabetes Program Recommendations:   Novolog 10 units tid recently d/c appropriately as patient has not received this routinely as ordered and when he did, blood sugars were low.  Agree with current orders.  Will follow.    Patient does take Novolog tid with meals at home but  Because of limited access to meals/food as an inpatient, he has not needed it.  May need to reassess need for consistent meal coverage at a much lower dose (2-3 units) if blood sugars begin to rise again- with a consistent amount of meal coverage, the patient is less likely to have erratic blood sugars com[pared to using correction insulin.   Gentry Fitz, RN, BA, MHA, CDE Diabetes Coordinator Inpatient Diabetes Program  [phone] (Team Pager) [phone] ([place]) 10/17/2015 12:41 PM

## 2015-10-17 NOTE — Progress Notes (Signed)
SATURATION QUALIFICATIONS: (This note is used to comply with regulatory documentation for home oxygen)  Patient Saturations on Room Air at Rest = 97%  Patient Saturations on Room Air while Ambulating = 95%  Patient Saturations on  Liters of oxygen while Ambulating = %  Please briefly explain why patient needs home oxygen: 

## 2015-10-17 NOTE — Progress Notes (Signed)
MD notified. Pt CBG remains close to 100 with 10 units with each meal ordered. MD order to hold a.m. Dose of scheduled insulin. Pts sbp is <100. MD order told hold enlapril and metoprolol. I will continue to assess.

## 2015-10-17 NOTE — Progress Notes (Signed)
[name]    MR#:  [id number]  DATE OF BIRTH:  1956/09/02  SUBJECTIVE:  CHIEF COMPLAINT:   Chief Complaint  Patient presents with  . Shortness of Breath     Came with worsening orthopnea, swelling on leg and SOB.   Today patient is complaining of worsening of right leg redness . Denies any worsening of shortness of breath  REVIEW OF SYSTEMS:   CONSTITUTIONAL: No fever, has weakness.  EYES: No blurred or double vision.  EARS, NOSE, AND THROAT: No tinnitus or ear pain.  RESPIRATORY: No cough, has shortness of breath , no wheezing or hemoptysis.  CARDIOVASCULAR: No chest pain, has orthopnea, has edema of both legs up to waist. GASTROINTESTINAL: No nausea, vomiting, diarrhea or abdominal pain.  GENITOURINARY: No dysuria, hematuria.  ENDOCRINE: No polyuria, nocturia,  HEMATOLOGY: No anemia, easy bruising or bleeding SKIN: No rash.Has fluid oozing from the skin in the legs. MUSCULOSKELETAL: No joint pain or arthritis. Reporting right leg redness Has pain in the extremities.  NEUROLOGIC: No tingling, numbness, weakness.  PSYCHIATRY: No anxiety or depression.   ROS  DRUG ALLERGIES:   Allergies  Allergen Reactions  . Codeine Nausea And Vomiting    VITALS:  Blood pressure 93/70, pulse 69, temperature 97.9 F (36.6 C), temperature source Oral, resp. rate 20, height 5\' 9"  (1.753 m), weight 120.702 kg (266 lb 1.6 oz), SpO2 97 %.  PHYSICAL EXAMINATION:   GENERAL: 60 y.o.-year-old obese patient lying in the bed with no acute distress.  EYES: Pupils equal, round, reactive to light and accommodation. No scleral icterus. Extraocular muscles intact.  HEENT: Head atraumatic, normocephalic. Oropharynx and nasopharynx clear.  NECK: Supple, has jugular venous distention. No thyroid enlargement, no tenderness.  LUNGS: Decereased breath sounds bilaterally, no wheezing, bilateral basal  crepitations heard. No use of accessory muscles of respiration.  CARDIOVASCULAR: S1, S2 normal. No murmurs, rubs, or gallops.  ABDOMEN: Soft, nontender, mild distension,ascites. Bowel sounds present. No organomegaly or mass. Scrotal edema noted. EXTREMITIES: Right lower extremity is erythematous. 3+ pedal edema, nocyanosis, or clubbing.  NEUROLOGIC: Cranial nerves II through XII are intact. Muscle strength 5/5 in all extremities. Sensation intact. Gait not checked PSYCHIATRIC: The patient is alert and oriented x 3.  SKIN: No obvious rash, lesion, or ulcer. Has fluid oozing from lower extremities.  Physical Exam LABORATORY PANEL:   CBC  Recent Labs Lab 10/14/15 0257  WBC 9.4  HGB 12.4*  HCT 37.8*  PLT 142*   ------------------------------------------------------------------------------------------------------------------  Chemistries   Recent Labs Lab 10/17/15 0530  NA 132*  K 3.5  CL 94*  CO2 31  GLUCOSE 113*  BUN 32*  CREATININE 1.16  CALCIUM 8.1*   ------------------------------------------------------------------------------------------------------------------  Cardiac Enzymes  Recent Labs Lab 10/14/15 0729 10/14/15 1404  TROPONINI 0.07* 0.07*   ------------------------------------------------------------------------------------------------------------------  RADIOLOGY:  No results found.  ASSESSMENT AND PLAN:   Principal Problem:   Dyspnea Active Problems:   Fluid overload   Pressure ulcer  * ac on ch systolic Heart failure with history of cardiomyopathy   Continue 60 mg IV lasix every 12 hours, monitor renal function. Strict weight monitoring Patient's Zaroxolyn is changed from when necessary to scheduled, we will increase the dose if blood pressure permits  Appreciated cardiology recommendations, requested call back from Dr. Clayborn Bigness   On ASA, discontinued enalapril. In view of hypotension    fluid and salt restriction and daily weight.    Also insisted regular  follow ups at CHF clinic.  *Right lower extremity cellulitis-clinically worsening Discontinue IV Rocephin. Started patient on Zosyn and vancomycin right lower extremity venous Dopplers with negative DVT  * elevated troponin due to CHF   Monitor on tele   Cardiology -recommended no interventions  * DM   Metformin and lantus, with sliding scale.  * COPD   No acute wheezing.  * hyperlipidemia   Cont atorvastatin.  All the records are reviewed and case discussed with Care Management/Social Workerr. Management plans discussed with the patient, family and they are in agreement.  CODE STATUS: Full.  TOTAL TIME TAKING CARE OF THIS PATIENT: 35 minutes.   POSSIBLE D/C IN 1-2 DAYS, DEPENDING ON CLINICAL CONDITION.   Nicholes Mango M.D on 10/17/2015   Between 7am to 6pm - Pager - [phone]  After 6pm go to [email or web address] - password [email or web address] [facility]  Office  [phone]  CC: Primary care physician; Pcp Not In System  Note: This dictation was prepared with Dragon dictation along with smaller phrase technology. Any transcriptional errors that result from this process are unintentional.

## 2015-10-18 LAB — GLUCOSE, CAPILLARY
GLUCOSE-CAPILLARY: 136 mg/dL — AB (ref 65–99)
Glucose-Capillary: 119 mg/dL — ABNORMAL HIGH (ref 65–99)
Glucose-Capillary: 158 mg/dL — ABNORMAL HIGH (ref 65–99)

## 2015-10-18 MED ORDER — FUROSEMIDE 10 MG/ML IJ SOLN
40.0000 mg | Freq: Two times a day (BID) | INTRAMUSCULAR | Status: DC
  Administered 2015-10-19 – 2015-10-21 (×5): 40 mg via INTRAVENOUS
  Filled 2015-10-18 (×5): qty 4

## 2015-10-18 NOTE — Progress Notes (Signed)
[facility] at [place] NAME: Ronald Miller    MR#:  [id number]  DATE OF BIRTH:  1956-06-01  SUBJECTIVE:  CHIEF COMPLAINT:   Chief Complaint  Patient presents with  . Shortness of Breath     Came with worsening orthopnea, swelling on leg and SOB.   Pts  right leg is still   Red but not getting any worse . Denies any worsening of shortness of breath  REVIEW OF SYSTEMS:   CONSTITUTIONAL: No fever, has weakness.  EYES: No blurred or double vision.  EARS, NOSE, AND THROAT: No tinnitus or ear pain.  RESPIRATORY: No cough, has shortness of breath , no wheezing or hemoptysis.  CARDIOVASCULAR: No chest pain, has orthopnea, has edema of both legs up to waist. GASTROINTESTINAL: No nausea, vomiting, diarrhea or abdominal pain.  GENITOURINARY: No dysuria, hematuria.  ENDOCRINE: No polyuria, nocturia,  HEMATOLOGY: No anemia, easy bruising or bleeding SKIN: No rash.Has fluid oozing from the skin in the legs. MUSCULOSKELETAL: No joint pain or arthritis. Reporting right leg redness Has pain in the extremities.  NEUROLOGIC: No tingling, numbness, weakness.  PSYCHIATRY: No anxiety or depression.   ROS  DRUG ALLERGIES:   Allergies  Allergen Reactions  . Codeine Nausea And Vomiting    VITALS:  Blood pressure 95/63, pulse 72, temperature 97.5 F (36.4 C), temperature source Oral, resp. rate 18, height 5\' 9"  (1.753 m), weight 120.112 kg (264 lb 12.8 oz), SpO2 94 %.  PHYSICAL EXAMINATION:   GENERAL: 60 y.o.-year-old obese patient lying in the bed with no acute distress.  EYES: Pupils equal, round, reactive to light and accommodation. No scleral icterus. Extraocular muscles intact.  HEENT: Head atraumatic, normocephalic. Oropharynx and nasopharynx clear.  NECK: Supple, has jugular venous distention. No thyroid enlargement, no tenderness.  LUNGS: Decereased breath sounds bilaterally, no wheezing, bilateral basal crepitations  heard. No use of accessory muscles of respiration.  CARDIOVASCULAR: S1, S2 normal. No murmurs, rubs, or gallops.  ABDOMEN: Soft, nontender, mild distension,ascites. Bowel sounds present. No organomegaly or mass. Scrotal edema noted. EXTREMITIES: Right lower extremity is erythematous. 3+ pedal edema, nocyanosis, or clubbing.  NEUROLOGIC: Cranial nerves II through XII are intact. Muscle strength 5/5 in all extremities. Sensation intact. Gait not checked PSYCHIATRIC: The patient is alert and oriented x 3.  SKIN: No obvious rash, lesion, or ulcer. Has fluid oozing from lower extremities.  Physical Exam LABORATORY PANEL:   CBC  Recent Labs Lab 10/14/15 0257  WBC 9.4  HGB 12.4*  HCT 37.8*  PLT 142*   ------------------------------------------------------------------------------------------------------------------  Chemistries   Recent Labs Lab 10/17/15 0530  NA 132*  K 3.5  CL 94*  CO2 31  GLUCOSE 113*  BUN 32*  CREATININE 1.16  CALCIUM 8.1*   ------------------------------------------------------------------------------------------------------------------  Cardiac Enzymes  Recent Labs Lab 10/14/15 0729 10/14/15 1404  TROPONINI 0.07* 0.07*   ------------------------------------------------------------------------------------------------------------------  RADIOLOGY:  No results found.  ASSESSMENT AND PLAN:   Principal Problem:   Dyspnea Active Problems:   Fluid overload   Pressure ulcer  * ac on ch systolic Heart failure with history of cardiomyopathy   Change lasix to  40 mg IV lasix every 12 hours, 2/2 hypotension monitor renal function. Strict weight monitoring Patient's Zaroxolyn is changed from when necessary to scheduled, we will increase the dose if blood pressure permits  Appreciated cardiology recommendations, requested call back from Dr. Clayborn Bigness    On ASA, discontinued enalapril. In view of hypotension    fluid and salt  restriction and daily  weight.   Also insisted regular follow ups at CHF clinic.  *Right lower extremity cellulitis-clinically worsening Discontinue IV Rocephin. Continue  patient on Zosyn and vancomycin right lower extremity venous Dopplers with negative DVT  * elevated troponin due to CHF   Monitor on tele   Cardiology -recommended no interventions  * DM   Metformin and lantus, with sliding scale.  * COPD   No acute wheezing.  * hyperlipidemia   Cont atorvastatin.  All the records are reviewed and case discussed with Care Management/Social Workerr. Management plans discussed with the patient, family and they are in agreement.  CODE STATUS: Full.  TOTAL TIME TAKING CARE OF THIS PATIENT: 35 minutes.   POSSIBLE D/C IN 1-2 DAYS, DEPENDING ON CLINICAL CONDITION.   Nicholes Mango M.D on 10/18/2015   Between 7am to 6pm - Pager - [phone]  After 6pm go to [email or web address] - password [email or web address] [facility]  Office  [phone]  CC: Primary care physician; Pcp Not In System  Note: This dictation was prepared with Dragon dictation along with smaller phrase technology. Any transcriptional errors that result from this process are unintentional.

## 2015-10-19 ENCOUNTER — Inpatient Hospital Stay: Payer: Medicare HMO

## 2015-10-19 LAB — CBC
HEMATOCRIT: 37.6 % — AB (ref 40.0–52.0)
Hemoglobin: 12.2 g/dL — ABNORMAL LOW (ref 13.0–18.0)
MCH: 29.8 pg (ref 26.0–34.0)
MCHC: 32.5 g/dL (ref 32.0–36.0)
MCV: 91.7 fL (ref 80.0–100.0)
Platelets: 102 10*3/uL — ABNORMAL LOW (ref 150–440)
RBC: 4.11 MIL/uL — ABNORMAL LOW (ref 4.40–5.90)
RDW: 16 % — AB (ref 11.5–14.5)
WBC: 6 10*3/uL (ref 3.8–10.6)

## 2015-10-19 LAB — GLUCOSE, CAPILLARY
GLUCOSE-CAPILLARY: 140 mg/dL — AB (ref 65–99)
GLUCOSE-CAPILLARY: 127 mg/dL — AB (ref 65–99)
GLUCOSE-CAPILLARY: 118 mg/dL — AB (ref 65–99)
Glucose-Capillary: 169 mg/dL — ABNORMAL HIGH (ref 65–99)

## 2015-10-19 LAB — BASIC METABOLIC PANEL
ANION GAP: 8 (ref 5–15)
BUN: 27 mg/dL — ABNORMAL HIGH (ref 6–20)
CALCIUM: 8.7 mg/dL — AB (ref 8.9–10.3)
CO2: 35 mmol/L — AB (ref 22–32)
Chloride: 95 mmol/L — ABNORMAL LOW (ref 101–111)
Creatinine, Ser: 1.09 mg/dL (ref 0.61–1.24)
GFR calc Af Amer: >60 mL/min (ref >60)
GFR calc non Af Amer: >60 mL/min (ref >60)
GLUCOSE: 136 mg/dL — AB (ref 65–99)
POTASSIUM: 3.7 mmol/L (ref 3.5–5.1)
Sodium: 138 mmol/L (ref 135–145)

## 2015-10-19 LAB — VANCOMYCIN, TROUGH: Vancomycin Tr: 13 ug/mL (ref 10–20)

## 2015-10-19 LAB — CREATININE, SERUM: Creatinine, Ser: 1.1 mg/dL (ref 0.61–1.24)

## 2015-10-19 MED ORDER — VANCOMYCIN HCL 10 G IV SOLR
1500.0000 mg | Freq: Two times a day (BID) | INTRAVENOUS | Status: DC
  Filled 2015-10-19: qty 1500

## 2015-10-19 MED ORDER — SODIUM CHLORIDE 0.9 % IV SOLN
1500.0000 mg | Freq: Two times a day (BID) | INTRAVENOUS | Status: DC
  Filled 2015-10-19 (×2): qty 1500

## 2015-10-19 NOTE — Plan of Care (Signed)
Problem: Education: Goal: Ability to demonstrate managment of disease process will improve Outcome: Progressing Educate daily weight and diet.

## 2015-10-19 NOTE — Progress Notes (Signed)
[place]  SUBJECTIVE: still with lower extremity edema. Has diuresed approximately 1 liter in past 24 hours. . Mild hypotension   Filed Vitals:   10/18/15 0800 10/18/15 1321 10/18/15 1947 10/19/15 0419  BP: 102/77 95/63 104/71 100/74  Pulse: 84 72 75 74  Temp: 97.5 F (36.4 C) 97.5 F (36.4 C) 98.2 F (36.8 C) 97.8 F (36.6 C)  TempSrc: Oral Oral Oral Oral  Resp: 18  16 16   Height:      Weight:    119.16 kg (262 lb 11.2 oz)  SpO2: 94% 94% 93% 93%    Intake/Output Summary (Last 24 hours) at 10/19/15 1016 Last data filed at 10/19/15 0933  Gross per 24 hour  Intake 1539.5 ml  Output   2550 ml  Net -1010.5 ml    LABS: Basic Metabolic Panel:  Recent Labs  10/17/15 0530 10/19/15 0501  NA 132* 138  K 3.5 3.7  CL 94* 95*  CO2 31 35*  GLUCOSE 113* 136*  BUN 32* 27*  CREATININE 1.16 1.09  CALCIUM 8.1* 8.7*   Liver Function Tests: No results for input(s): AST, ALT, ALKPHOS, BILITOT, PROT, ALBUMIN in the last 72 hours. No results for input(s): LIPASE, AMYLASE in the last 72 hours. CBC:  Recent Labs  10/19/15 0501  WBC 6.0  HGB 12.2*  HCT 37.6*  MCV 91.7  PLT 102*   Cardiac Enzymes: No results for input(s): CKTOTAL, CKMB, CKMBINDEX, TROPONINI in the last 72 hours. BNP: Invalid input(s): POCBNP D-Dimer: No results for input(s): DDIMER in the last 72 hours. Hemoglobin A1C: No results for input(s): HGBA1C in the last 72 hours. Fasting Lipid Panel: No results for input(s): CHOL, HDL, LDLCALC, TRIG, CHOLHDL, LDLDIRECT in the last 72 hours. Thyroid Function Tests: No results for input(s): TSH, T4TOTAL, T3FREE, THYROIDAB in the last 72 hours.  Invalid input(s): FREET3 Anemia Panel: No results for input(s): VITAMINB12, FOLATE, FERRITIN, TIBC, IRON, RETICCTPCT in the last 72 hours.   Physical Exam: Blood pressure 100/74, pulse 74, temperature 97.8 F (36.6 C), temperature source Oral, resp. rate 16, height 5\' 9"  (1.753  m), weight 119.16 kg (262 lb 11.2 oz), SpO2 93 %.    Head: Normocephalic, without obvious abnormality, atraumatic Resp: clear to auscultation bilaterally Cardio: regular rate and rhythm GI: abnormal findings:  obese Extremities: edema 4+ edema in lower extremities with erythema Neurologic: Grossly normal  TELEMETRY: Reviewed telemetry pt in v pace:  ASSESSMENT AND PLAN:  Principal Problem:   Dyspnea-still sob but continuing to diurese Active Problems:   Fluid overload-continue loop diuiretic and metolazone. WIll hold lisinopril for  Now due to blood pressure.    Pressure ulcer    Teodoro Spray., MD, [facility] 10/19/2015 10:16 AM

## 2015-10-19 NOTE — Progress Notes (Signed)
[place] [place] NAME: Ronald Miller    MR#:  [id number]  DATE OF BIRTH:  04-13-1956  SUBJECTIVE:  CHIEF COMPLAINT:   Chief Complaint  Patient presents with  . Shortness of Breath     Came with worsening orthopnea, swelling on leg and SOB.   Pts  right leg is still  red with no clinical improvement . Denies any worsening of shortness of breath.  REVIEW OF SYSTEMS:   CONSTITUTIONAL: No fever, has weakness.  EYES: No blurred or double vision.  EARS, NOSE, AND THROAT: No tinnitus or ear pain.  RESPIRATORY: No cough, has shortness of breath , no wheezing or hemoptysis.  CARDIOVASCULAR: No chest pain, has orthopnea, has edema of both legs up to waist. GASTROINTESTINAL: No nausea, vomiting, diarrhea or abdominal pain.  GENITOURINARY: No dysuria, hematuria.  ENDOCRINE: No polyuria, nocturia,  HEMATOLOGY: No anemia, easy bruising or bleeding SKIN: No rash.Has fluid oozing from the skin in the legs. MUSCULOSKELETAL: No joint pain or arthritis. Reporting right leg redness Has pain in the extremities.  NEUROLOGIC: No tingling, numbness, weakness.  PSYCHIATRY: No anxiety or depression.   ROS  DRUG ALLERGIES:   Allergies  Allergen Reactions  . Codeine Nausea And Vomiting    VITALS:  Blood pressure 101/66, pulse 73, temperature 97.7 F (36.5 C), temperature source Oral, resp. rate 17, height 5\' 9"  (1.753 m), weight 119.16 kg (262 lb 11.2 oz), SpO2 95 %.  PHYSICAL EXAMINATION:   GENERAL: 60 y.o.-year-old obese patient lying in the bed with no acute distress.  EYES: Pupils equal, round, reactive to light and accommodation. No scleral icterus. Extraocular muscles intact.  HEENT: Head atraumatic, normocephalic. Oropharynx and nasopharynx clear.  NECK: Supple, has jugular venous distention. No thyroid enlargement, no tenderness.  LUNGS: Normal breath sounds bilaterally, no wheezing, bilateral basal crepitations  heard. No use of accessory muscles of respiration.  CARDIOVASCULAR: S1, S2 normal. No murmurs, rubs, or gallops.  ABDOMEN: Soft, nontender, mild distension,ascites. Bowel sounds present. No organomegaly or mass. Scrotal edema noted. EXTREMITIES: Right lower extremity is erythematous, getting worse with red streaks tracking up to the knee. 1+ pedal edema, nocyanosis, or clubbing.  NEUROLOGIC: Cranial nerves II through XII are intact. Muscle strength 5/5 in all extremities. Sensation intact. Gait not checked PSYCHIATRIC: The patient is alert and oriented x 3.  SKIN: No obvious rash, lesion, or ulcer. Has fluid oozing from lower extremities.  Physical Exam LABORATORY PANEL:   CBC  Recent Labs Lab 10/19/15 0501  WBC 6.0  HGB 12.2*  HCT 37.6*  PLT 102*   ------------------------------------------------------------------------------------------------------------------  Chemistries   Recent Labs Lab 10/19/15 0501 10/19/15 1102  NA 138  --   K 3.7  --   CL 95*  --   CO2 35*  --   GLUCOSE 136*  --   BUN 27*  --   CREATININE 1.09 1.10  CALCIUM 8.7*  --    ------------------------------------------------------------------------------------------------------------------  Cardiac Enzymes  Recent Labs Lab 10/14/15 0729 10/14/15 1404  TROPONINI 0.07* 0.07*   ------------------------------------------------------------------------------------------------------------------  RADIOLOGY:  No results found.  ASSESSMENT AND PLAN:   Principal Problem:   Dyspnea Active Problems:   Fluid overload   Pressure ulcer  * ac on ch systolic Heart failure with history of cardiomyopathy   Change lasix to  40 mg IV lasix every 12 hours, 2/2 hypotension monitor renal function. Strict weight monitoring  120.5 kg-120 kg-119.1 kilograms Patient's Zaroxolyn is changed from when necessary to  scheduled, we will increase the dose if blood pressure permits  Appreciated cardiology  recommendations, discussed with Dr.fath   On ASA, discontinued enalapril. In view of hypotension    fluid and salt restriction and daily weight.   Also insisted regular follow ups at CHF clinic.  *Right lower extremity cellulitis-clinically worsening Discontinue IV Rocephin. Patient was on Zosyn and vancomycin for 2 days with no significant improvement, discontinue Zosyn and vancomycin antibiotics. Start patient on daptomycin Consult infectious disease right lower extremity venous Dopplers with negative DVT  * elevated troponin due to CHF   Monitor on tele   Cardiology -recommended no interventions  * DM   Metformin and lantus, with sliding scale.  * COPD   No acute wheezing.  * hyperlipidemia   Cont atorvastatin.  All the records are reviewed and case discussed with Care Management/Social Workerr. Management plans discussed with the patient, family and they are in agreement.  CODE STATUS: Full.  TOTAL TIME TAKING CARE OF THIS PATIENT: 35 minutes.   POSSIBLE D/C IN 1-2 DAYS, DEPENDING ON CLINICAL CONDITION.   Nicholes Mango M.D on 10/19/2015   Between 7am to 6pm - Pager - [phone]  After 6pm go to [email or web address] - password [email or web address] [facility]  Office  [phone]  CC: Primary care physician; Pcp Not In System  Note: This dictation was prepared with Dragon dictation along with smaller phrase technology. Any transcriptional errors that result from this process are unintentional.

## 2015-10-19 NOTE — Progress Notes (Signed)
ANTIBIOTIC CONSULT NOTE -follow up  Pharmacy Consult for Vancomycin/Zosyn Indication: Cellulitis  Allergies  Allergen Reactions  . Codeine Nausea And Vomiting    Patient Measurements: Height: 5\' 9"  (175.3 cm) Weight: 262 lb 11.2 oz (119.16 kg) IBW/kg (Calculated) : 70.7 Adjusted Body Weight: 90.7 kg  Vital Signs: Temp: 97.7 F (36.5 C) (01/15 1136) Temp Source: Oral (01/15 1136) BP: 101/66 mmHg (01/15 1136) Pulse Rate: 73 (01/15 1136) Intake/Output from previous day: 01/14 0701 - 01/15 0700 In: 1539.5 [P.O.:1084; I.V.:5.5; IV Piggyback:450] Out: 2250 [Urine:2250] Intake/Output from this shift: Total I/O In: 240 [P.O.:240] Out: 1200 [Urine:1200]  Labs:  Recent Labs  10/17/15 0530 10/19/15 0501 10/19/15 1102  WBC  --  6.0  --   HGB  --  12.2*  --   PLT  --  102*  --   CREATININE 1.16 1.09 1.10   Estimated Creatinine Clearance: 93.3 mL/min (by C-G formula based on Cr of 1.1).  Recent Labs  10/19/15 1102  [place]     Microbiology: Recent Results (from the past 720 hour(s))  MRSA PCR Screening     Status: None   Collection Time: 10/14/15  2:05 AM  Result Value Ref Range Status   MRSA by PCR NEGATIVE NEGATIVE Final    Comment:        The GeneXpert MRSA Assay (FDA approved for NASAL specimens only), is one component of a comprehensive MRSA colonization surveillance program. It is not intended to diagnose MRSA infection nor to guide or monitor treatment for MRSA infections.     Medical History: Past Medical History  Diagnosis Date  . ASCVD (arteriosclerotic cardiovascular disease)   . Diabetes mellitus   . Hypertension   . History of cocaine abuse   . Tobacco abuse   . Depression   . Suicidal ideation   . Chronic systolic CHF (congestive heart failure) ([place])   . COPD (chronic obstructive pulmonary disease) ([place])   . Coronary artery disease     Medications:  Scheduled:  . aspirin EC  81 mg Oral Daily  . atorvastatin  20 mg Oral QPM   . enoxaparin (LOVENOX) injection  40 mg Subcutaneous Q24H  . escitalopram  20 mg Oral Daily  . furosemide  40 mg Intravenous Q12H  . insulin aspart  0-15 Units Subcutaneous TID WC  . insulin aspart  0-5 Units Subcutaneous QHS  . insulin glargine  40 Units Subcutaneous QHS  . metFORMIN  1,000 mg Oral BID  . metolazone  5 mg Oral Daily  . metoprolol succinate  100 mg Oral Daily  . piperacillin-tazobactam (ZOSYN)  IV  4.5 g Intravenous 3 times per day  . potassium chloride SA  20 mEq Oral BID  . senna  1 tablet Oral BID  . sodium chloride  3 mL Intravenous Q12H  . vancomycin  1,500 mg Intravenous Q12H   Infusions:    Assessment: 60 y/o admitted with SOB and LE cellulitis.   Goal of Therapy:  Vancomycin trough level 10-15 mcg/ml  Plan:  Zosyn 4.5 g EI q 8 hours.   Vancomycin 1250 mg iv q 12 hours with stacked dosing and a trough with the 5th total dose.   1/15: Vancomycin trough = 13 mcg/ml. Patient not improving per MD note. Will slightly adjust Vancomycin to 1500mg  IV q12h.  New ID consult ordered- f/u  Will continue to follow renal function and culture results.   Urijah Raynor A 10/19/2015,3:23 PM

## 2015-10-20 LAB — BASIC METABOLIC PANEL
ANION GAP: 9 (ref 5–15)
BUN: 31 mg/dL — AB (ref 6–20)
CHLORIDE: 93 mmol/L — AB (ref 101–111)
CO2: 35 mmol/L — ABNORMAL HIGH (ref 22–32)
Calcium: 8.8 mg/dL — ABNORMAL LOW (ref 8.9–10.3)
Creatinine, Ser: 1.13 mg/dL (ref 0.61–1.24)
Glucose, Bld: 92 mg/dL (ref 65–99)
POTASSIUM: 2.9 mmol/L — AB (ref 3.5–5.1)
SODIUM: 137 mmol/L (ref 135–145)

## 2015-10-20 LAB — CBC WITH DIFFERENTIAL/PLATELET
BASOS ABS: 0 10*3/uL (ref 0–0.1)
BASOS PCT: 1 %
EOS ABS: 0.1 10*3/uL (ref 0–0.7)
EOS PCT: 2 %
HCT: 36.4 % — ABNORMAL LOW (ref 40.0–52.0)
HEMOGLOBIN: 12 g/dL — AB (ref 13.0–18.0)
LYMPHS ABS: 1.1 10*3/uL (ref 1.0–3.6)
Lymphocytes Relative: 15 %
MCH: 30.1 pg (ref 26.0–34.0)
MCHC: 33 g/dL (ref 32.0–36.0)
MCV: 91.1 fL (ref 80.0–100.0)
Monocytes Absolute: 0.6 10*3/uL (ref 0.2–1.0)
Monocytes Relative: 8 %
NEUTROS PCT: 74 %
Neutro Abs: 5.3 10*3/uL (ref 1.4–6.5)
PLATELETS: 119 10*3/uL — AB (ref 150–440)
RBC: 4 MIL/uL — AB (ref 4.40–5.90)
RDW: 16 % — ABNORMAL HIGH (ref 11.5–14.5)
WBC: 7.2 10*3/uL (ref 3.8–10.6)

## 2015-10-20 LAB — GLUCOSE, CAPILLARY
GLUCOSE-CAPILLARY: 85 mg/dL (ref 65–99)
GLUCOSE-CAPILLARY: 155 mg/dL — AB (ref 65–99)
Glucose-Capillary: 150 mg/dL — ABNORMAL HIGH (ref 65–99)
Glucose-Capillary: 154 mg/dL — ABNORMAL HIGH (ref 65–99)

## 2015-10-20 LAB — MAGNESIUM: Magnesium: 1.3 mg/dL — ABNORMAL LOW (ref 1.7–2.4)

## 2015-10-20 LAB — CK: CK TOTAL: 40 U/L — AB (ref 49–397)

## 2015-10-20 LAB — POTASSIUM: Potassium: 4.4 mmol/L (ref 3.5–5.1)

## 2015-10-20 MED ORDER — SODIUM CHLORIDE 0.9 % IV SOLN
4.0000 mg/kg | INTRAVENOUS | Status: DC
  Filled 2015-10-20: qty 7.2

## 2015-10-20 MED ORDER — POTASSIUM CHLORIDE 20 MEQ/15ML (10%) PO SOLN: 40.0000 meq | Freq: Once | ORAL | Status: DC

## 2015-10-20 MED ORDER — MAGNESIUM SULFATE 4 GM/100ML IV SOLN
4.0000 g | Freq: Once | INTRAVENOUS | Status: AC
  Administered 2015-10-20: 4 g via INTRAVENOUS
  Filled 2015-10-20: qty 100

## 2015-10-20 MED ORDER — ENOXAPARIN SODIUM 40 MG/0.4ML ~~LOC~~ SOLN
40.0000 mg | SUBCUTANEOUS | Status: DC
  Administered 2015-10-20: 40 mg via SUBCUTANEOUS
  Filled 2015-10-20: qty 0.4

## 2015-10-20 MED ORDER — POTASSIUM CHLORIDE 10 MEQ/100ML IV SOLN: 10.0000 meq | INTRAVENOUS | Status: DC

## 2015-10-20 MED ORDER — POTASSIUM CHLORIDE 20 MEQ PO PACK
40.0000 meq | PACK | Freq: Once | ORAL | Status: AC
  Administered 2015-10-20: 40 meq via ORAL
  Filled 2015-10-20: qty 2

## 2015-10-20 MED ORDER — SODIUM CHLORIDE 0.9 % IV SOLN
4.0000 mg/kg | INTRAVENOUS | Status: DC
  Administered 2015-10-20: 476 mg via INTRAVENOUS
  Filled 2015-10-20: qty 9.52

## 2015-10-20 MED ORDER — POTASSIUM CHLORIDE CRYS ER 20 MEQ PO TBCR
40.0000 meq | EXTENDED_RELEASE_TABLET | ORAL | Status: AC
Start: 2015-10-20 — End: 2015-10-20
  Administered 2015-10-20 (×2): 40 meq via ORAL
  Filled 2015-10-20 (×2): qty 2

## 2015-10-20 MED ORDER — CEFAZOLIN SODIUM 1-5 GM-% IV SOLN
1.0000 g | Freq: Three times a day (TID) | INTRAVENOUS | Status: DC
  Administered 2015-10-20 – 2015-10-21 (×4): 1 g via INTRAVENOUS
  Filled 2015-10-20 (×7): qty 50

## 2015-10-20 MED ORDER — POTASSIUM CHLORIDE 10 MEQ/100ML IV SOLN
10.0000 meq | INTRAVENOUS | Status: AC
  Administered 2015-10-20 (×3): 10 meq via INTRAVENOUS
  Filled 2015-10-20 (×4): qty 100

## 2015-10-20 MED ORDER — POTASSIUM CHLORIDE CRYS ER 20 MEQ PO TBCR
40.0000 meq | EXTENDED_RELEASE_TABLET | Freq: Two times a day (BID) | ORAL | Status: DC
Start: 2015-10-20 — End: 2015-10-20

## 2015-10-20 NOTE — Consult Note (Signed)
[facility]     Reason for Consult:  LE cellulitis Referring Physician: Nicholes Mango  Date of Admission:  10/13/2015   Principal Problem:   Dyspnea Active Problems:   Fluid overload   Pressure ulcer   HPI: Ronald Miller. is a 60 y.o. male with a known history of systolic heart failure EF 20-25% , COPD, coronary artery disease, diabetes mellitus, hypertension admitted 1/10 with CHF exacerbation with LE edema, scrotal edema edema to waist, orthopnea. He had fluid oozing from legs per report.  Has been diuresed.   On admit was afebrile and wbc 9.  Started on I ctx 1/12 for LE redness. Then changed 1/13 to vanco zosyn and today to daptomycin.  LE doppler neg  Past Medical History  Diagnosis Date  . ASCVD (arteriosclerotic cardiovascular disease)   . Diabetes mellitus   . Hypertension   . History of cocaine abuse   . Tobacco abuse   . Depression   . Suicidal ideation   . Chronic systolic CHF (congestive heart failure) ([place])   . COPD (chronic obstructive pulmonary disease) ([place])   . Coronary artery disease    Past Surgical History  Procedure Laterality Date  . Coronary artery bypass graft  2001  . Lumbar disc surgery  02/1999    Discectomy and fusion  . Vasectomy      Subsequent reversal  . Insert / replace / remove pacemaker    . Cardiac defibrillator placement    . Cholecystectomy N/A 08/30/2015    Procedure: LAPAROSCOPIC CHOLECYSTECTOMY;  Surgeon: Hubbard Robinson, MD;  Location: [name];  Service: General;  Laterality: N/A;  . Cardiac defibrillator placement     Social History  Substance Use Topics  . Smoking status: Former Smoker -- 1.50 packs/day for 23 years    Types: Cigarettes  . Smokeless tobacco: Never Used  . Alcohol Use: No   Family History  Problem Relation Age of Onset  . CAD Father   . Diabetes Father   . Heart failure Father     Allergies:  Allergies  Allergen Reactions  . Codeine Nausea And Vomiting    Current  antibiotics: Antibiotics Given (last 72 hours)    Date/Time Action Medication Dose Rate   10/17/15 1545 Given   vancomycin (VANCOCIN) 1,250 mg in sodium chloride 0.9 % 250 mL IVPB 1,250 mg 166.7 mL/hr   10/17/15 2323 Given   vancomycin (VANCOCIN) 1,250 mg in sodium chloride 0.9 % 250 mL IVPB 1,250 mg 166.7 mL/hr   10/18/15 0115 Given   piperacillin-tazobactam (ZOSYN) IVPB 4.5 g 4.5 g 25 mL/hr   10/18/15 0900 Given   piperacillin-tazobactam (ZOSYN) IVPB 4.5 g 4.5 g 25 mL/hr   10/18/15 1211 Given   vancomycin (VANCOCIN) 1,250 mg in sodium chloride 0.9 % 250 mL IVPB 1,250 mg 166.7 mL/hr   10/18/15 1741 Given   piperacillin-tazobactam (ZOSYN) IVPB 4.5 g 4.5 g 25 mL/hr   10/18/15 2228 Given   vancomycin (VANCOCIN) 1,250 mg in sodium chloride 0.9 % 250 mL IVPB 1,250 mg 166.7 mL/hr   10/19/15 0004 Given   piperacillin-tazobactam (ZOSYN) IVPB 4.5 g 4.5 g 25 mL/hr   10/19/15 0900 Given   piperacillin-tazobactam (ZOSYN) IVPB 4.5 g 4.5 g 25 mL/hr   10/20/15 1526 Given   DAPTOmycin (CUBICIN) 476 mg in sodium chloride 0.9 % IVPB 476 mg 219 mL/hr      MEDICATIONS: . aspirin EC  81 mg Oral Daily  . atorvastatin  20 mg Oral QPM  .  DAPTOmycin (CUBICIN)  IV  4 mg/kg Intravenous Q24H  . enoxaparin (LOVENOX) injection  40 mg Subcutaneous Q24H  . escitalopram  20 mg Oral Daily  . furosemide  40 mg Intravenous Q12H  . insulin aspart  0-15 Units Subcutaneous TID WC  . insulin aspart  0-5 Units Subcutaneous QHS  . insulin glargine  40 Units Subcutaneous QHS  . metFORMIN  1,000 mg Oral BID  . metolazone  5 mg Oral Daily  . metoprolol succinate  100 mg Oral Daily  . potassium chloride SA  40 mEq Oral BID  . senna  1 tablet Oral BID    Review of Systems - 11 systems reviewed and negative per HPI   OBJECTIVE: Temp:  [97.5 F (36.4 C)-98 F (36.7 C)] 97.6 F (36.4 C) (01/16 1117) Pulse Rate:  [70-76] 70 (01/16 1117) Resp:  [18-20] 20 (01/16 1117) BP: (94-107)/(64-85) 94/64 mmHg (01/16  1117) SpO2:  [91 %-98 %] 98 % (01/16 1117) Weight:  [118.956 kg (262 lb 4 oz)] 118.956 kg (262 lb 4 oz) (01/16 0630) Physical Exam  Constitutional: He is oriented to person, place, and time. He appears well-developed and well-nourished. No distress.  HENT:  Mouth/Throat: Oropharynx is clear and moist. No oropharyngeal exudate.  Cardiovascular: Normal rate, regular rhythm and normal heart sounds. Exam reveals no gallop and no friction rub.  No murmur heard.  Pulmonary/Chest: Effort normal and breath sounds normal. No respiratory distress. He has no wheezes.  Abdominal: Soft. Bowel sounds are normal. He exhibits no distension. There is no tenderness.  Lymphadenopathy: He has no cervical adenopathy.  Ext 2+ RLE edema to upper thigh, has scars from venous harvesting Skin: RLE with brawny erythema to upper shin, some scaliness and cvs changes Has blister ant shin and a v shaped lac with no drainage Dorsum of foot just before toes has large shallow area of skin breakdown, ulceration  Psychiatric: He has a normal mood and affect. His behavior is normal.     LABS: Results for orders placed or performed during the hospital encounter of 10/13/15 (from the past 48 hour(s))  Glucose, capillary     Status: Abnormal   Collection Time: 10/18/15  4:39 PM  Result Value Ref Range   Glucose-Capillary 119 (H) 65 - 99 mg/dL   Comment 1 Notify RN   Glucose, capillary     Status: Abnormal   Collection Time: 10/18/15  7:47 PM  Result Value Ref Range   Glucose-Capillary 158 (H) 65 - 99 mg/dL  CBC     Status: Abnormal   Collection Time: 10/19/15  5:01 AM  Result Value Ref Range   WBC 6.0 3.8 - 10.6 K/uL   RBC 4.11 (L) 4.40 - 5.90 MIL/uL   Hemoglobin 12.2 (L) 13.0 - 18.0 g/dL   HCT 37.6 (L) 40.0 - 52.0 %   MCV 91.7 80.0 - 100.0 fL   MCH 29.8 26.0 - 34.0 pg   MCHC 32.5 32.0 - 36.0 g/dL   RDW 16.0 (H) 11.5 - 14.5 %   Platelets 102 (L) 150 - 440 K/uL  Basic metabolic panel     Status: Abnormal    Collection Time: 10/19/15  5:01 AM  Result Value Ref Range   Sodium 138 135 - 145 mmol/L   Potassium 3.7 3.5 - 5.1 mmol/L   Chloride 95 (L) 101 - 111 mmol/L   CO2 35 (H) 22 - 32 mmol/L   Glucose, Bld 136 (H) 65 - 99 mg/dL   BUN 27 (H)  6 - 20 mg/dL   Creatinine, Ser 1.09 0.61 - 1.24 mg/dL   Calcium 8.7 (L) 8.9 - 10.3 mg/dL   GFR calc non Af Amer >60 >60 mL/min   GFR calc Af Amer >60 >60 mL/min    Comment: (NOTE) The eGFR has been calculated using the CKD EPI equation. This calculation has not been validated in all clinical situations. eGFR's persistently <60 mL/min signify possible Chronic Kidney Disease.    Anion gap 8 5 - 15  Glucose, capillary     Status: Abnormal   Collection Time: 10/19/15  7:25 AM  Result Value Ref Range   Glucose-Capillary 140 (H) 65 - 99 mg/dL   Comment 1 Notify RN   Vancomycin, trough     Status: None   Collection Time: 10/19/15 11:02 AM  Result Value Ref Range   Vancomycin Tr 13 10 - 20 ug/mL  Creatinine, serum     Status: None   Collection Time: 10/19/15 11:02 AM  Result Value Ref Range   Creatinine, Ser 1.10 0.61 - 1.24 mg/dL   GFR calc non Af Amer >60 >60 mL/min   GFR calc Af Amer >60 >60 mL/min    Comment: (NOTE) The eGFR has been calculated using the CKD EPI equation. This calculation has not been validated in all clinical situations. eGFR's persistently <60 mL/min signify possible Chronic Kidney Disease.   Glucose, capillary     Status: Abnormal   Collection Time: 10/19/15 11:36 AM  Result Value Ref Range   Glucose-Capillary 127 (H) 65 - 99 mg/dL   Comment 1 Notify RN   Glucose, capillary     Status: Abnormal   Collection Time: 10/19/15  5:16 PM  Result Value Ref Range   Glucose-Capillary 118 (H) 65 - 99 mg/dL   Comment 1 Notify RN   Glucose, capillary     Status: Abnormal   Collection Time: 10/19/15  8:48 PM  Result Value Ref Range   Glucose-Capillary 169 (H) 65 - 99 mg/dL  CBC with Differential/Platelet     Status: Abnormal    Collection Time: 10/20/15  5:05 AM  Result Value Ref Range   WBC 7.2 3.8 - 10.6 K/uL   RBC 4.00 (L) 4.40 - 5.90 MIL/uL   Hemoglobin 12.0 (L) 13.0 - 18.0 g/dL   HCT 36.4 (L) 40.0 - 52.0 %   MCV 91.1 80.0 - 100.0 fL   MCH 30.1 26.0 - 34.0 pg   MCHC 33.0 32.0 - 36.0 g/dL   RDW 16.0 (H) 11.5 - 14.5 %   Platelets 119 (L) 150 - 440 K/uL   Neutrophils Relative % 74 %   Neutro Abs 5.3 1.4 - 6.5 K/uL   Lymphocytes Relative 15 %   Lymphs Abs 1.1 1.0 - 3.6 K/uL   Monocytes Relative 8 %   Monocytes Absolute 0.6 0.2 - 1.0 K/uL   Eosinophils Relative 2 %   Eosinophils Absolute 0.1 0 - 0.7 K/uL   Basophils Relative 1 %   Basophils Absolute 0.0 0 - 0.1 K/uL  Basic metabolic panel     Status: Abnormal   Collection Time: 10/20/15  5:05 AM  Result Value Ref Range   Sodium 137 135 - 145 mmol/L   Potassium 2.9 (LL) 3.5 - 5.1 mmol/L    Comment: CRITICAL RESULT CALLED TO, READ BACK BY AND VERIFIED WITH BROOKE ROBERTSON AT 0536 10/20/15.PMH   Chloride 93 (L) 101 - 111 mmol/L   CO2 35 (H) 22 - 32 mmol/L   Glucose, Bld  92 65 - 99 mg/dL   BUN 31 (H) 6 - 20 mg/dL   Creatinine, Ser 1.13 0.61 - 1.24 mg/dL   Calcium 8.8 (L) 8.9 - 10.3 mg/dL   GFR calc non Af Amer >60 >60 mL/min   GFR calc Af Amer >60 >60 mL/min    Comment: (NOTE) The eGFR has been calculated using the CKD EPI equation. This calculation has not been validated in all clinical situations. eGFR's persistently <60 mL/min signify possible Chronic Kidney Disease.    Anion gap 9 5 - 15  Glucose, capillary     Status: None   Collection Time: 10/20/15  7:48 AM  Result Value Ref Range   Glucose-Capillary 85 65 - 99 mg/dL   Comment 1 Notify RN   Glucose, capillary     Status: Abnormal   Collection Time: 10/20/15 11:15 AM  Result Value Ref Range   Glucose-Capillary 150 (H) 65 - 99 mg/dL   Comment 1 Notify RN    No components found for: ESR, C REACTIVE PROTEIN MICRO: Recent Results (from the past 720 hour(s))  MRSA PCR Screening      Status: None   Collection Time: 10/14/15  2:05 AM  Result Value Ref Range Status   MRSA by PCR NEGATIVE NEGATIVE Final    Comment:        The GeneXpert MRSA Assay (FDA approved for NASAL specimens only), is one component of a comprehensive MRSA colonization surveillance program. It is not intended to diagnose MRSA infection nor to guide or monitor treatment for MRSA infections.     IMAGING: Dg Chest 2 View  10/13/2015  CLINICAL DATA:  Bilateral lower extremity edema for 2 days. History of congestive heart failure, hypertension and COPD. EXAM: CHEST  2 VIEW COMPARISON:  09/30/2015 radiographs.  CT 08/29/2015. FINDINGS: The left subclavian AICD leads appear unchanged within the right atrium, right ventricle and coronary sinus. There is stable cardiomegaly status post CABG. Chronic vascular congestion is similar to the prior study. There is no overt pulmonary edema, confluent airspace opacity or pleural effusion. Mild thoracic spine degenerative changes are stable. IMPRESSION: Stable cardiomegaly and chronic vascular congestion. No overt pulmonary edema. Electronically Signed   By: Richardean Sale M.D.   On: 10/13/2015 19:02   US Venous Img Lower Unilateral Right  10/19/2015  CLINICAL DATA:  Cellulitis with worsening pain in the right lower extremity for 2 weeks. EXAM: RIGHT LOWER EXTREMITY VENOUS DOPPLER ULTRASOUND TECHNIQUE: Gray-scale sonography with graded compression, as well as color Doppler and duplex ultrasound were performed to evaluate the lower extremity deep venous systems from the level of the common femoral vein and including the common femoral, femoral, profunda femoral, popliteal and calf veins including the posterior tibial, peroneal and gastrocnemius veins when visible. The superficial great saphenous vein was also interrogated. Spectral Doppler was utilized to evaluate flow at rest and with distal augmentation maneuvers in the common femoral, femoral and popliteal veins.  COMPARISON:  10/13/2015 right lower extremity venous Doppler scan. FINDINGS: Contralateral Common Femoral Vein: Respiratory phasicity is normal and symmetric with the symptomatic side. No evidence of thrombus. Normal compressibility. Common Femoral Vein: No evidence of thrombus. Normal compressibility, respiratory phasicity and response to augmentation. Saphenofemoral Junction: No evidence of thrombus. Normal compressibility and flow on color Doppler imaging. Profunda Femoral Vein: No evidence of thrombus. Normal compressibility and flow on color Doppler imaging. Femoral Vein: No evidence of thrombus. Normal compressibility, respiratory phasicity and response to augmentation. Popliteal Vein: No evidence of thrombus. Normal compressibility,  respiratory phasicity and response to augmentation. Calf Veins: No evidence of thrombus. Normal compressibility and flow on color Doppler imaging. Superficial Great Saphenous Vein: No evidence of thrombus. Normal compressibility and flow on color Doppler imaging. Venous Reflux:  None. Other Findings: Prominent superficial edema throughout the right lower extremity. IMPRESSION: No evidence of deep venous thrombosis in the right lower extremity. Electronically Signed   By: Ilona Sorrel M.D.   On: 10/19/2015 17:01   [place] Extrem Low Right Comp  10/14/2015  CLINICAL DATA:  60 year old male with right leg pain and swelling EXAM: Right LOWER EXTREMITY VENOUS DOPPLER ULTRASOUND TECHNIQUE: Gray-scale sonography with graded compression, as well as color Doppler and duplex ultrasound were performed to evaluate the lower extremity deep venous systems from the level of the common femoral vein and including the common femoral, femoral, profunda femoral, popliteal and calf veins including the posterior tibial, peroneal and gastrocnemius veins when visible. The superficial great saphenous vein was also interrogated. Spectral Doppler was utilized to evaluate flow at rest and with distal  augmentation maneuvers in the common femoral, femoral and popliteal veins. COMPARISON:  Ultrasound dated 02/08/2005 FINDINGS: Contralateral Common Femoral Vein: Respiratory phasicity is normal and symmetric with the symptomatic side. No evidence of thrombus. Normal compressibility. Common Femoral Vein: No evidence of thrombus. Normal compressibility, respiratory phasicity and response to augmentation. Saphenofemoral Junction: No evidence of thrombus. Normal compressibility and flow on color Doppler imaging. Profunda Femoral Vein: No evidence of thrombus. Normal compressibility and flow on color Doppler imaging. Femoral Vein: No evidence of thrombus. Normal compressibility, respiratory phasicity and response to augmentation. Popliteal Vein: No evidence of thrombus. Normal compressibility, respiratory phasicity and response to augmentation. Calf Veins: No evidence of thrombus. Normal compressibility and flow on color Doppler imaging. Superficial Great Saphenous Vein: No evidence of thrombus. Normal compressibility and flow on color Doppler imaging. IMPRESSION: No evidence of deep venous thrombosis in the right lower extremity. Electronically Signed   By: Anner Crete M.D.   On: 10/14/2015 01:00   Dg Chest Portable 1 View  09/30/2015  CLINICAL DATA:  Shortness of breath with chest pain and cough EXAM: PORTABLE CHEST 1 VIEW COMPARISON:  Chest radiograph April 27, 2015; chest CT April 28, 2015 FINDINGS: There is no edema or consolidation. There is cardiomegaly with pulmonary vascularity within normal limits. Defibrillator leads are attached to the right atrium, right ventricle, and left ventricle. No pneumothorax. Lymph node prominence seen on recent CT is not appreciable radiographically. IMPRESSION: Cardiomegaly.  No edema or consolidation. Electronically Signed   By: Lowella Grip III M.D.   On: 09/30/2015 11:52    Assessment:   Ronald Miller. is a 60 y.o. male with CHF EF 20-25% admit with vol  overload, weeping legs now with persistent RLE edema and redness cw cellulitis. Does have a few small wounds on leg but no purulence.  Likely has strep cellulitis and main issue with edema and not elevating legs (he was sitting in chair with legs on ground when I saw him)  Recommendations Continue diuresis Elevate legs on 6 pillows - discussed with pt and with nurse Change back to ancef I have cultured the LE wound to eval for resistant organism.  If improving in AM would place unawrap and can likely dc on oral abx once stable from CV point of view. Thank you very much for allowing me to participate in the care of this patient. Please call with questions.   Cheral Marker. Ola Spurr, MD

## 2015-10-20 NOTE — Consult Note (Addendum)
MEDICATION RELATED CONSULT NOTE - Follow Up  Pharmacy Consult for electroyltes Indication: hypokalemia  Allergies  Allergen Reactions  . Codeine Nausea And Vomiting    Patient Measurements: Height: 5\' 9"  (175.3 cm) Weight: 262 lb 4 oz (118.956 kg) IBW/kg (Calculated) : 70.7 Adjusted Body Weight:   Vital Signs: Temp: 97.6 F (36.4 C) (01/16 1117) Temp Source: Oral (01/16 1117) BP: 94/64 mmHg (01/16 1117) Pulse Rate: 70 (01/16 1117) Intake/Output from previous day: 01/15 0701 - 01/16 0700 In: 720 [P.O.:720] Out: 2500 [Urine:2500] Intake/Output from this shift: Total I/O In: 789.5 [P.O.:480; IV Piggyback:309.5] Out: 1075 [Urine:1075]  Labs:  Recent Labs  10/19/15 0501 10/19/15 1102 10/20/15 0505  WBC 6.0  --  7.2  HGB 12.2*  --  12.0*  HCT 37.6*  --  36.4*  PLT 102*  --  119*  CREATININE 1.09 1.10 1.13  MG  --   --  1.3*   Estimated Creatinine Clearance: 90.7 mL/min (by C-G formula based on Cr of 1.13).   Microbiology: Recent Results (from the past 720 hour(s))  MRSA PCR Screening     Status: None   Collection Time: 10/14/15  2:05 AM  Result Value Ref Range Status   MRSA by PCR NEGATIVE NEGATIVE Final    Comment:        The GeneXpert MRSA Assay (FDA approved for NASAL specimens only), is one component of a comprehensive MRSA colonization surveillance program. It is not intended to diagnose MRSA infection nor to guide or monitor treatment for MRSA infections.     Medical History: Past Medical History  Diagnosis Date  . ASCVD (arteriosclerotic cardiovascular disease)   . Diabetes mellitus   . Hypertension   . History of cocaine abuse   . Tobacco abuse   . Depression   . Suicidal ideation   . Chronic systolic CHF (congestive heart failure) ([place])   . COPD (chronic obstructive pulmonary disease) ([place])   . Coronary artery disease     Medications:  Scheduled:  . aspirin EC  81 mg Oral Daily  . atorvastatin  20 mg Oral QPM  .  ceFAZolin  (ANCEF) IV  1 g Intravenous 3 times per day  . enoxaparin (LOVENOX) injection  40 mg Subcutaneous Q24H  . escitalopram  20 mg Oral Daily  . furosemide  40 mg Intravenous Q12H  . insulin aspart  0-15 Units Subcutaneous TID WC  . insulin aspart  0-5 Units Subcutaneous QHS  . insulin glargine  40 Units Subcutaneous QHS  . metFORMIN  1,000 mg Oral BID  . metolazone  5 mg Oral Daily  . metoprolol succinate  100 mg Oral Daily  . potassium chloride SA  40 mEq Oral BID  . senna  1 tablet Oral BID    Assessment: Pt is a 60 year old male with cellulitis and lower extremity swelling, CHF. Pt is on lasix 40 q 12. Pt K this AM was 2.9. He received 140 MEQ PO and 30 MEQ IV of KCL today.   Potassium at 1741: 4.4 Mag: 1.3  Goal of Therapy:  K 3.5-5  Plan:  Will supplement Magnesium with 4 gm IV Once and recheck in AM. Potassium WNL on recheck. Discontinue current orders for KCl 40 meq po BID as patient's magnesium is going to be supplemented.  Will recheck labs in AM to help determine maintenance KCl dosing.   Myesha Stillion G 10/20/2015,7:00 PM

## 2015-10-20 NOTE — Care Management Important Message (Signed)
Important Message  Patient Details  Name: Ronald Miller. MRN: [id number] Date of Birth: 1955/11/20   Medicare Important Message Given:  Yes    Sheila Gervasi A, RN 10/20/2015, 8:12 AM

## 2015-10-20 NOTE — Consult Note (Signed)
ANTIBIOTIC CONSULT NOTE - INITIAL  Pharmacy Consult for daptomycin Indication: celulitus  Allergies  Allergen Reactions  . Codeine Nausea And Vomiting    Patient Measurements: Height: 5\' 9"  (175.3 cm) Weight: 262 lb 4 oz (118.956 kg) IBW/kg (Calculated) : 70.7 Adjusted Body Weight: 90kg  Vital Signs: Temp: 97.6 F (36.4 C) (01/16 1117) Temp Source: Oral (01/16 1117) BP: 94/64 mmHg (01/16 1117) Pulse Rate: 70 (01/16 1117) Intake/Output from previous day: 01/15 0701 - 01/16 0700 In: 720 [P.O.:720] Out: 2500 [Urine:2500] Intake/Output from this shift: Total I/O In: 240 [P.O.:240] Out: 1075 [Urine:1075]  Labs:  Recent Labs  10/19/15 0501 10/19/15 1102 10/20/15 0505  WBC 6.0  --  7.2  HGB 12.2*  --  12.0*  PLT 102*  --  119*  CREATININE 1.09 1.10 1.13   Estimated Creatinine Clearance: 90.7 mL/min (by C-G formula based on Cr of 1.13).  Recent Labs  10/19/15 1102  [place]     Microbiology: Recent Results (from the past 720 hour(s))  MRSA PCR Screening     Status: None   Collection Time: 10/14/15  2:05 AM  Result Value Ref Range Status   MRSA by PCR NEGATIVE NEGATIVE Final    Comment:        The GeneXpert MRSA Assay (FDA approved for NASAL specimens only), is one component of a comprehensive MRSA colonization surveillance program. It is not intended to diagnose MRSA infection nor to guide or monitor treatment for MRSA infections.     Medical History: Past Medical History  Diagnosis Date  . ASCVD (arteriosclerotic cardiovascular disease)   . Diabetes mellitus   . Hypertension   . History of cocaine abuse   . Tobacco abuse   . Depression   . Suicidal ideation   . Chronic systolic CHF (congestive heart failure) ([place])   . COPD (chronic obstructive pulmonary disease) ([place])   . Coronary artery disease     Medications:  Scheduled:  . aspirin EC  81 mg Oral Daily  . atorvastatin  20 mg Oral QPM  . DAPTOmycin (CUBICIN)  IV  4 mg/kg  (Adjusted) Intravenous Q24H  . enoxaparin (LOVENOX) injection  40 mg Subcutaneous Q24H  . escitalopram  20 mg Oral Daily  . furosemide  40 mg Intravenous Q12H  . insulin aspart  0-15 Units Subcutaneous TID WC  . insulin aspart  0-5 Units Subcutaneous QHS  . insulin glargine  40 Units Subcutaneous QHS  . metFORMIN  1,000 mg Oral BID  . metolazone  5 mg Oral Daily  . metoprolol succinate  100 mg Oral Daily  . potassium chloride SA  40 mEq Oral BID  . senna  1 tablet Oral BID   Assessment: Pt is a 60 year old male with celulitis/lower extremity swelling. Patient was on vancomycin and zosyn for 2 days without improvement. After consultation with Dr Ola Spurr, Dr Margaretmary Eddy ordered daptomycin  Goal of Therapy:  resolution of infection  Plan:  Follow up culture results Daptomycin 4mg /kg using actual body weight q 24hours  CPK has been ordered and will be ordered weekly while pt is on therapy. Pharmacy to continue to monitor. Thank you for the consult.  Tichina Koebel D Jandel Patriarca 10/20/2015,2:48 PM

## 2015-10-20 NOTE — Consult Note (Signed)
ANTIBIOTIC CONSULT NOTE - INITIAL  Pharmacy Consult for cefazolin Indication: cellulitids  Allergies  Allergen Reactions  . Codeine Nausea And Vomiting    Patient Measurements: Height: 5\' 9"  (175.3 cm) Weight: 262 lb 4 oz (118.956 kg) IBW/kg (Calculated) : 70.7 Adjusted Body Weight:   Vital Signs: Temp: 97.6 F (36.4 C) (01/16 1117) Temp Source: Oral (01/16 1117) BP: 94/64 mmHg (01/16 1117) Pulse Rate: 70 (01/16 1117) Intake/Output from previous day: 01/15 0701 - 01/16 0700 In: 720 [P.O.:720] Out: 2500 [Urine:2500] Intake/Output from this shift: Total I/O In: 240 [P.O.:240] Out: 1075 [Urine:1075]  Labs:  Recent Labs  10/19/15 0501 10/19/15 1102 10/20/15 0505  WBC 6.0  --  7.2  HGB 12.2*  --  12.0*  PLT 102*  --  119*  CREATININE 1.09 1.10 1.13   Estimated Creatinine Clearance: 90.7 mL/min (by C-G formula based on Cr of 1.13).  Recent Labs  10/19/15 1102  [place]     Microbiology: Recent Results (from the past 720 hour(s))  MRSA PCR Screening     Status: None   Collection Time: 10/14/15  2:05 AM  Result Value Ref Range Status   MRSA by PCR NEGATIVE NEGATIVE Final    Comment:        The GeneXpert MRSA Assay (FDA approved for NASAL specimens only), is one component of a comprehensive MRSA colonization surveillance program. It is not intended to diagnose MRSA infection nor to guide or monitor treatment for MRSA infections.     Medical History: Past Medical History  Diagnosis Date  . ASCVD (arteriosclerotic cardiovascular disease)   . Diabetes mellitus   . Hypertension   . History of cocaine abuse   . Tobacco abuse   . Depression   . Suicidal ideation   . Chronic systolic CHF (congestive heart failure) ([place])   . COPD (chronic obstructive pulmonary disease) ([place])   . Coronary artery disease     Medications:  Scheduled:  . aspirin EC  81 mg Oral Daily  . atorvastatin  20 mg Oral QPM  .  ceFAZolin (ANCEF) IV  1 g Intravenous 3  times per day  . enoxaparin (LOVENOX) injection  40 mg Subcutaneous Q24H  . escitalopram  20 mg Oral Daily  . furosemide  40 mg Intravenous Q12H  . insulin aspart  0-15 Units Subcutaneous TID WC  . insulin aspart  0-5 Units Subcutaneous QHS  . insulin glargine  40 Units Subcutaneous QHS  . metFORMIN  1,000 mg Oral BID  . metolazone  5 mg Oral Daily  . metoprolol succinate  100 mg Oral Daily  . potassium chloride SA  40 mEq Oral BID  . senna  1 tablet Oral BID   Assessment: Pt is a 60 year old male with celulitis of the lower extremity. Per Dr. Ola Spurr transition patient to cefazolin. Pharmacy consulted to dose  Goal of Therapy:  resolution of infectionm  Plan:  Follow up culture results cefazolin 1g q 8 hours. pharmacy to continue to monitor. Thank you for the consult.  Lakevia Perris D Mithra Spano 10/20/2015,3:55 PM

## 2015-10-20 NOTE — Progress Notes (Addendum)
[facility] NAME: Ronald Miller    MR#:  [id number]  DATE OF BIRTH:  [birth year]  SUBJECTIVE:  CHIEF COMPLAINT:   Chief Complaint  Patient presents with  . Shortness of Breath     Came with worsening orthopnea, swelling on leg and SOB.   Pts  right leg redness and swelling are better. Keeping his legs elevated. Denies any worsening of shortness of breath.  REVIEW OF SYSTEMS:   CONSTITUTIONAL: No fever, has weakness.  EYES: No blurred or double vision.  EARS, NOSE, AND THROAT: No tinnitus or ear pain.  RESPIRATORY: No cough, has shortness of breath , no wheezing or hemoptysis.  CARDIOVASCULAR: No chest pain, has orthopnea, has edema of both legs up to waist. GASTROINTESTINAL: No nausea, vomiting, diarrhea or abdominal pain.  GENITOURINARY: No dysuria, hematuria.  ENDOCRINE: No polyuria, nocturia,  HEMATOLOGY: No anemia, easy bruising or bleeding SKIN: No rash. Right lower leg redness is improving, 2 wounds on the foot MUSCULOSKELETAL: No joint pain or arthritis. Reporting right leg redness Has pain in the extremities.  NEUROLOGIC: No tingling, numbness, weakness.  PSYCHIATRY: No anxiety or depression.   ROS  DRUG ALLERGIES:   Allergies  Allergen Reactions  . Codeine Nausea And Vomiting    VITALS:  Blood pressure 94/64, pulse 70, temperature 97.6 F (36.4 C), temperature source Oral, resp. rate 20, height 5\' 9"  (1.753 m), weight 118.956 kg (262 lb 4 oz), SpO2 98 %.  PHYSICAL EXAMINATION:   GENERAL: 60 y.o.-year-old obese patient lying in the bed with no acute distress.  EYES: Pupils equal, round, reactive to light and accommodation. No scleral icterus. Extraocular muscles intact.  HEENT: Head atraumatic, normocephalic. Oropharynx and nasopharynx clear.  NECK: Supple, has jugular venous distention. No thyroid enlargement, no tenderness.  LUNGS: Normal breath sounds bilaterally, no wheezing,  bilateral basal crepitations heard. No use of accessory muscles of respiration.  CARDIOVASCULAR: S1, S2 normal. No murmurs, rubs, or gallops.  ABDOMEN: Soft, nontender, mild distension,ascites. Bowel sounds present. No organomegaly or mass. Scrotal edema noted. EXTREMITIES: Right lower extremity is with less erythema and edema. 2+ pedal edema, nocyanosis, or clubbing.  NEUROLOGIC: Cranial nerves II through XII are intact. Muscle strength 5/5 in all extremities. Sensation intact. Gait not checked PSYCHIATRIC: The patient is alert and oriented x 3.  SKIN: No obvious rash, lesion, or ulcer. Has fluid oozing from lower extremities.  Physical Exam LABORATORY PANEL:   CBC  Recent Labs Lab 10/20/15 0505  WBC 7.2  HGB 12.0*  HCT 36.4*  PLT 119*   ------------------------------------------------------------------------------------------------------------------  Chemistries   Recent Labs Lab 10/20/15 0505  NA 137  K 2.9*  CL 93*  CO2 35*  GLUCOSE 92  BUN 31*  CREATININE 1.13  CALCIUM 8.8*   ------------------------------------------------------------------------------------------------------------------  Cardiac Enzymes  Recent Labs Lab 10/14/15 0729 10/14/15 1404  TROPONINI 0.07* 0.07*   ------------------------------------------------------------------------------------------------------------------  RADIOLOGY:  US Venous Img Lower Unilateral Right  10/19/2015  CLINICAL DATA:  Cellulitis with worsening pain in the right lower extremity for 2 weeks. EXAM: RIGHT LOWER EXTREMITY VENOUS DOPPLER ULTRASOUND TECHNIQUE: Gray-scale sonography with graded compression, as well as color Doppler and duplex ultrasound were performed to evaluate the lower extremity deep venous systems from the level of the common femoral vein and including the common femoral, femoral, profunda femoral, popliteal and calf veins including the posterior tibial, peroneal and gastrocnemius veins when  visible. The superficial great saphenous vein was also interrogated. Spectral Doppler was  utilized to evaluate flow at rest and with distal augmentation maneuvers in the common femoral, femoral and popliteal veins. COMPARISON:  10/13/2015 right lower extremity venous Doppler scan. FINDINGS: Contralateral Common Femoral Vein: Respiratory phasicity is normal and symmetric with the symptomatic side. No evidence of thrombus. Normal compressibility. Common Femoral Vein: No evidence of thrombus. Normal compressibility, respiratory phasicity and response to augmentation. Saphenofemoral Junction: No evidence of thrombus. Normal compressibility and flow on color Doppler imaging. Profunda Femoral Vein: No evidence of thrombus. Normal compressibility and flow on color Doppler imaging. Femoral Vein: No evidence of thrombus. Normal compressibility, respiratory phasicity and response to augmentation. Popliteal Vein: No evidence of thrombus. Normal compressibility, respiratory phasicity and response to augmentation. Calf Veins: No evidence of thrombus. Normal compressibility and flow on color Doppler imaging. Superficial Great Saphenous Vein: No evidence of thrombus. Normal compressibility and flow on color Doppler imaging. Venous Reflux:  None. Other Findings: Prominent superficial edema throughout the right lower extremity. IMPRESSION: No evidence of deep venous thrombosis in the right lower extremity. Electronically Signed   By: Ilona Sorrel M.D.   On: 10/19/2015 17:01    ASSESSMENT AND PLAN:   Principal Problem:   Dyspnea Active Problems:   Fluid overload   Pressure ulcer  * ac on ch systolic Heart failure with history of cardiomyopathy   Change lasix to  40 mg IV lasix every 12 hours, 2/2 hypotension monitor renal function. Strict weight monitoring  120.5 kg-120 kg-119.1 --118.9 Patient's Zaroxolyn is changed from when necessary to scheduled, we will increase the dose if blood pressure permits  Appreciated  cardiology recommendations, discussed with Dr.fath   On ASA, discontinued enalapril. In view of hypotension    fluid and salt restriction and daily weight.   Also insisted regular follow ups at CHF clinic.  *Hypokalemia Potassium chloride 40 mg by mouth twice a day Check BMP and magnesium in a.m.  *Right lower extremity cellulitis-clinically worsening Discontinued IV Rocephin. Patient was on Zosyn and vancomycin for 2 days with no significant improvement, discontinued Zosyn and vancomycin antibiotics. Started patient on daptomycin 10/19/2015. Erythema and tenderness are  Improving. Patient started feeling better Consult infectious disease-discussed with Dr. Ola Spurr right lower extremity venous Dopplers with negative DVT Encouraged patient to keep his legs elevated  * elevated troponin due to CHF   Monitor on tele   Cardiology -recommended no interventions  * DM   Metformin and lantus, with sliding scale.  * COPD   No acute wheezing.  * hyperlipidemia   Cont atorvastatin.  All the records are reviewed and case discussed with Care Management/Social Workerr. Management plans discussed with the patient, family and they are in agreement.  CODE STATUS: Full.  TOTAL TIME TAKING CARE OF THIS PATIENT: 35 minutes.   POSSIBLE D/C IN 1-2 DAYS, DEPENDING ON CLINICAL CONDITION.   Nicholes Mango M.D on 10/20/2015   Between 7am to 6pm - Pager - [phone]  After 6pm go to [email or web address] - password [email or web address] [facility]  Office  [phone]  CC: Primary care physician; Pcp Not In System  Note: This dictation was prepared with Dragon dictation along with smaller phrase technology. Any transcriptional errors that result from this process are unintentional.

## 2015-10-20 NOTE — Consult Note (Signed)
MEDICATION RELATED CONSULT NOTE - INITIAL   Pharmacy Consult for electroyltes Indication: hypokalemia  Allergies  Allergen Reactions  . Codeine Nausea And Vomiting    Patient Measurements: Height: 5\' 9"  (175.3 cm) Weight: 262 lb 4 oz (118.956 kg) IBW/kg (Calculated) : 70.7 Adjusted Body Weight:   Vital Signs: Temp: 97.6 F (36.4 C) (01/16 1117) Temp Source: Oral (01/16 1117) BP: 94/64 mmHg (01/16 1117) Pulse Rate: 70 (01/16 1117) Intake/Output from previous day: 01/15 0701 - 01/16 0700 In: 720 [P.O.:720] Out: 2500 [Urine:2500] Intake/Output from this shift: Total I/O In: 240 [P.O.:240] Out: 1075 [Urine:1075]  Labs:  Recent Labs  10/19/15 0501 10/19/15 1102 10/20/15 0505  WBC 6.0  --  7.2  HGB 12.2*  --  12.0*  HCT 37.6*  --  36.4*  PLT 102*  --  119*  CREATININE 1.09 1.10 1.13   Estimated Creatinine Clearance: 90.7 mL/min (by C-G formula based on Cr of 1.13).   Microbiology: Recent Results (from the past 720 hour(s))  MRSA PCR Screening     Status: None   Collection Time: 10/14/15  2:05 AM  Result Value Ref Range Status   MRSA by PCR NEGATIVE NEGATIVE Final    Comment:        The GeneXpert MRSA Assay (FDA approved for NASAL specimens only), is one component of a comprehensive MRSA colonization surveillance program. It is not intended to diagnose MRSA infection nor to guide or monitor treatment for MRSA infections.     Medical History: Past Medical History  Diagnosis Date  . ASCVD (arteriosclerotic cardiovascular disease)   . Diabetes mellitus   . Hypertension   . History of cocaine abuse   . Tobacco abuse   . Depression   . Suicidal ideation   . Chronic systolic CHF (congestive heart failure) ([place])   . COPD (chronic obstructive pulmonary disease) ([place])   . Coronary artery disease     Medications:  Scheduled:  . aspirin EC  81 mg Oral Daily  . atorvastatin  20 mg Oral QPM  . DAPTOmycin (CUBICIN)  IV  4 mg/kg Intravenous Q24H  .  enoxaparin (LOVENOX) injection  40 mg Subcutaneous Q24H  . escitalopram  20 mg Oral Daily  . furosemide  40 mg Intravenous Q12H  . insulin aspart  0-15 Units Subcutaneous TID WC  . insulin aspart  0-5 Units Subcutaneous QHS  . insulin glargine  40 Units Subcutaneous QHS  . metFORMIN  1,000 mg Oral BID  . metolazone  5 mg Oral Daily  . metoprolol succinate  100 mg Oral Daily  . potassium chloride SA  40 mEq Oral BID  . senna  1 tablet Oral BID    Assessment: Pt is a 60 year old male with cellulitis and lower extremity swelling, CHF. Pt is on lasix 40 q 12. Pt K this AM was 2.9. He received 140 MEQ PO and 30 MEQ IV of KCL today.   Goal of Therapy:  K 3.5-5  Plan:  Will recheck K level at 1800. Add on Mg level has been ordered. Patient currently has KCL 40 MEQ BID ordered. Follow up with level and adjust as needed.  Carollee Massed Anessa Charley 10/20/2015,3:01 PM

## 2015-10-21 LAB — GLUCOSE, CAPILLARY
GLUCOSE-CAPILLARY: 146 mg/dL — AB (ref 65–99)
GLUCOSE-CAPILLARY: 93 mg/dL (ref 65–99)
Glucose-Capillary: 106 mg/dL — ABNORMAL HIGH (ref 65–99)

## 2015-10-21 LAB — BASIC METABOLIC PANEL
ANION GAP: 9 (ref 5–15)
BUN: 33 mg/dL — ABNORMAL HIGH (ref 6–20)
CHLORIDE: 92 mmol/L — AB (ref 101–111)
CO2: 36 mmol/L — AB (ref 22–32)
CREATININE: 1.13 mg/dL (ref 0.61–1.24)
Calcium: 8.8 mg/dL — ABNORMAL LOW (ref 8.9–10.3)
GFR calc non Af Amer: >60 mL/min (ref >60)
GLUCOSE: 124 mg/dL — AB (ref 65–99)
Potassium: 3.2 mmol/L — ABNORMAL LOW (ref 3.5–5.1)
Sodium: 137 mmol/L (ref 135–145)

## 2015-10-21 LAB — MAGNESIUM: Magnesium: 2.1 mg/dL (ref 1.7–2.4)

## 2015-10-21 MED ORDER — POTASSIUM CHLORIDE CRYS ER 20 MEQ PO TBCR
40.0000 meq | EXTENDED_RELEASE_TABLET | Freq: Two times a day (BID) | ORAL | Status: DC
Start: 2015-10-21 — End: 2015-10-21

## 2015-10-21 MED ORDER — CEPHALEXIN 500 MG PO CAPS: 500.0000 mg | ORAL_CAPSULE | Freq: Three times a day (TID) | ORAL | Status: DC

## 2015-10-21 MED ORDER — POTASSIUM CHLORIDE CRYS ER 20 MEQ PO TBCR
40.0000 meq | EXTENDED_RELEASE_TABLET | Freq: Three times a day (TID) | ORAL | Status: DC
  Administered 2015-10-21 (×2): 40 meq via ORAL
  Filled 2015-10-21 (×2): qty 2

## 2015-10-21 MED ORDER — ZOLPIDEM TARTRATE ER 12.5 MG PO TBCR: 12.5000 mg | EXTENDED_RELEASE_TABLET | Freq: Every evening | ORAL | Status: DC | PRN

## 2015-10-21 MED ORDER — POTASSIUM CHLORIDE CRYS ER 20 MEQ PO TBCR: 20.0000 meq | EXTENDED_RELEASE_TABLET | Freq: Two times a day (BID) | ORAL | Status: DC

## 2015-10-21 MED ORDER — OXYCODONE-ACETAMINOPHEN 7.5-325 MG PO TABS: 1.0000 | ORAL_TABLET | Freq: Three times a day (TID) | ORAL | Status: DC | PRN

## 2015-10-21 NOTE — Progress Notes (Signed)
[place] INFECTIOUS DISEASE PROGRESS NOTE Date of Admission:  10/13/2015     ID: Ronald Miller. is a 60 y.o. male with  RLE cellulitis and wound Principal Problem:   Dyspnea Active Problems:   Fluid overload   Pressure ulcer   Subjective: Leg a little better with elevation. No fevers.   ROS  Eleven systems are reviewed and negative except per hpi  Medications:  Antibiotics Given (last 72 hours)    Date/Time Action Medication Dose Rate   10/18/15 1741 Given   piperacillin-tazobactam (ZOSYN) IVPB 4.5 g 4.5 g 25 mL/hr   10/18/15 2228 Given   vancomycin (VANCOCIN) 1,250 mg in sodium chloride 0.9 % 250 mL IVPB 1,250 mg 166.7 mL/hr   10/19/15 0004 Given   piperacillin-tazobactam (ZOSYN) IVPB 4.5 g 4.5 g 25 mL/hr   10/19/15 0900 Given   piperacillin-tazobactam (ZOSYN) IVPB 4.5 g 4.5 g 25 mL/hr   10/20/15 1526 Given   DAPTOmycin (CUBICIN) 476 mg in sodium chloride 0.9 % IVPB 476 mg 219 mL/hr   10/20/15 1722 Given   ceFAZolin (ANCEF) IVPB 1 g/50 mL premix 1 g 100 mL/hr   10/20/15 2212 Given   ceFAZolin (ANCEF) IVPB 1 g/50 mL premix 1 g 100 mL/hr   10/21/15 0634 Given   ceFAZolin (ANCEF) IVPB 1 g/50 mL premix 1 g 100 mL/hr     . aspirin EC  81 mg Oral Daily  . atorvastatin  20 mg Oral QPM  .  ceFAZolin (ANCEF) IV  1 g Intravenous 3 times per day  . enoxaparin (LOVENOX) injection  40 mg Subcutaneous Q24H  . escitalopram  20 mg Oral Daily  . furosemide  40 mg Intravenous Q12H  . insulin aspart  0-15 Units Subcutaneous TID WC  . insulin aspart  0-5 Units Subcutaneous QHS  . insulin glargine  40 Units Subcutaneous QHS  . metFORMIN  1,000 mg Oral BID  . metolazone  5 mg Oral Daily  . metoprolol succinate  100 mg Oral Daily  . potassium chloride  40 mEq Oral TID WC  . senna  1 tablet Oral BID    Objective: Vital signs in last 24 hours: Temp:  [97.6 F (36.4 C)-98.2 F (36.8 C)] 97.6 F (36.4 C) (01/17 1125) Pulse Rate:  [69-73] 69 (01/17 1125) Resp:  [17] 17  (01/17 0520) BP: (89-107)/(67-80) 105/78 mmHg (01/17 1125) SpO2:  [93 %-95 %] 95 % (01/17 1125) Weight:  [120.702 kg (266 lb 1.6 oz)] 120.702 kg (266 lb 1.6 oz) (01/17 0520) Constitutional: He is oriented to person, place, and time. He appears well-developed and well-nourished. No distress.  HENT:  Mouth/Throat: Oropharynx is clear and moist. No oropharyngeal exudate.  Cardiovascular: Normal rate, regular rhythm and normal heart sounds. Exam reveals no gallop and no friction rub.  No murmur heard.  Pulmonary/Chest: Effort normal and breath sounds normal. No respiratory distress. He has no wheezes.  Abdominal: Soft. Bowel sounds are normal. He exhibits no distension. There is no tenderness.  Lymphadenopathy: He has no cervical adenopathy.  Ext 2+ RLE edema to upper thigh, has scars from venous harvesting Skin: RLE with brawny erythema to upper shin, some scaliness and cvs changes Has blister ant shin and a v shaped lac with no drainage Dorsum of foot just before toes has large shallow area of skin breakdown, ulceration  Psychiatric: He has a normal mood and affect. His behavior is normal.    Lab Results  Recent Labs  10/19/15 0501  10/20/15 0505 10/20/15  1741 10/21/15 0508  WBC 6.0  --  7.2  --   --   HGB 12.2*  --  12.0*  --   --   HCT 37.6*  --  36.4*  --   --   NA 138  --  137  --  137  K 3.7  --  2.9* 4.4 3.2*  CL 95*  --  93*  --  92*  CO2 35*  --  35*  --  36*  BUN 27*  --  31*  --  33*  CREATININE 1.09  < > 1.13  --  1.13  < > = values in this interval not displayed.  Microbiology: Results for orders placed or performed during the hospital encounter of 10/13/15  MRSA PCR Screening     Status: None   Collection Time: 10/14/15  2:05 AM  Result Value Ref Range Status   MRSA by PCR NEGATIVE NEGATIVE Final    Comment:        The GeneXpert MRSA Assay (FDA approved for NASAL specimens only), is one component of a comprehensive MRSA colonization surveillance  program. It is not intended to diagnose MRSA infection nor to guide or monitor treatment for MRSA infections.   Wound culture     Status: None (Preliminary result)   Collection Time: 10/20/15  3:54 PM  Result Value Ref Range Status   Specimen Description LEG RIGHT  Final   Special Requests Normal  Final   Gram Stain PENDING  Incomplete   Culture HOLDING FOR POSSIBLE PATHOGEN  Final   Report Status PENDING  Incomplete    Studies/Results: US Venous Img Lower Unilateral Right  10/19/2015  CLINICAL DATA:  Cellulitis with worsening pain in the right lower extremity for 2 weeks. EXAM: RIGHT LOWER EXTREMITY VENOUS DOPPLER ULTRASOUND TECHNIQUE: Gray-scale sonography with graded compression, as well as color Doppler and duplex ultrasound were performed to evaluate the lower extremity deep venous systems from the level of the common femoral vein and including the common femoral, femoral, profunda femoral, popliteal and calf veins including the posterior tibial, peroneal and gastrocnemius veins when visible. The superficial great saphenous vein was also interrogated. Spectral Doppler was utilized to evaluate flow at rest and with distal augmentation maneuvers in the common femoral, femoral and popliteal veins. COMPARISON:  10/13/2015 right lower extremity venous Doppler scan. FINDINGS: Contralateral Common Femoral Vein: Respiratory phasicity is normal and symmetric with the symptomatic side. No evidence of thrombus. Normal compressibility. Common Femoral Vein: No evidence of thrombus. Normal compressibility, respiratory phasicity and response to augmentation. Saphenofemoral Junction: No evidence of thrombus. Normal compressibility and flow on color Doppler imaging. Profunda Femoral Vein: No evidence of thrombus. Normal compressibility and flow on color Doppler imaging. Femoral Vein: No evidence of thrombus. Normal compressibility, respiratory phasicity and response to augmentation. Popliteal Vein: No evidence  of thrombus. Normal compressibility, respiratory phasicity and response to augmentation. Calf Veins: No evidence of thrombus. Normal compressibility and flow on color Doppler imaging. Superficial Great Saphenous Vein: No evidence of thrombus. Normal compressibility and flow on color Doppler imaging. Venous Reflux:  None. Other Findings: Prominent superficial edema throughout the right lower extremity. IMPRESSION: No evidence of deep venous thrombosis in the right lower extremity. Electronically Signed   By: Ilona Sorrel M.D.   On: 10/19/2015 17:01    Assessment/Plan: Merel Karaman. is a 60 y.o. male with CHF EF 20-25% admit with vol overload, weeping legs now with persistent RLE edema and redness cw cellulitis. Does  have a few small wounds on leg but no purulence. Likely has strep cellulitis and main issue with edema and not elevating legs (he was sitting in chair with legs on ground when I saw him) Some improvement with elevation   Recommendations Continue diuresis Continue Elevation Ordered Unna boot to R leg Change  Ancef to keflex 500 tid for 10 days total more I have cultured the LE wound to eval for resistant organism.  I can see Monday in clinic to change wrap and evaluate  Thank you very much for the consult. Will follow with you.  [place], Wanda   10/21/2015, 1:39 PM

## 2015-10-21 NOTE — Care Management Note (Signed)
Case Management Note  Patient Details  Name: Ronald Miller. MRN: [id number] Date of Birth: 1955/12/03  Subjective/Objective:  Reviewed notes. Dr. Ola Spurr to follow up with patient in the clinic on Monday for reevaluation of right lower extremity cellulitis and further dressing changes. No additional needs identified.                   Action/Plan:   Expected Discharge Date:                  Expected Discharge Plan:     In-House Referral:     Discharge planning Services     Post Acute Care Choice:    Choice offered to:     DME Arranged:    DME Agency:     HH Arranged:    [place] Agency:     Status of Service:     Medicare Important Message Given:  Yes Date Medicare IM Given:    Medicare IM give by:    Date Additional Medicare IM Given:    Additional Medicare Important Message give by:     If discussed at [place] of Stay Meetings, dates discussed:    Additional Comments:  Jolly Mango, RN 10/21/2015, 2:11 PM

## 2015-10-21 NOTE — Clinical Social Work Note (Signed)
Clinical Social Work Assessment  Patient Details  Name: Ronald Miller. MRN: [id number] Date of Birth: June 26, 1956  Date of referral:  10/21/15               Reason for consult:  Discharge Planning                Permission sought to share information with:    Permission granted to share information::  No  Name::        Agency::     Relationship::     Contact Information:     Housing/Transportation Living arrangements for the past 2 months:  Single Family Home Source of Information:  Patient Patient Interpreter Needed:  None Criminal Activity/Legal Involvement Pertinent to Current Situation/Hospitalization:  No - Comment as needed Significant Relationships:  Other Family Members, Parents Lives with:  Self Do you feel safe going back to the place where you live?  Yes Need for family participation in patient care:  No (Coment)  Care giving concerns:  RN consulted CSW to provide transportation assistance for patient.    Social Worker assessment / plan:  CSW was alerted by RN that patient has difficulty going to his doctor appointments due to the lack of transportation. She reports that the bus stop is somewhat far from his home and patient has a difficult time walking to it.   CSW met with patient at bedside. Patient was laying in bed watching television. Patient is alert and oriented. CSW inquired about the issues patient is having with transportation. Per patient he owns a car but has not been able to drive it due to his medical issues. 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 Per patient he lost his job and soon after drawing unemployment he received disability. Patient reports that he has a hard time getting to his medical appointments. He reports that he does not mind riding the bus. Per patient the [employer] and bus stops are far from his house. CSW provided patient with the [place] [address]- [employer] guide and encouraged him to use  [employer] ([employer]). CSW explained [employer] transportation [employer]. CSW informed patient that [employer] provides transportation for general purpose trips,medical trips and almost any non-emergency trip destination. CSW encouraged patient to contact [employer] to discuss their special programs and pricing to determine if he qualified for a discounted fair. CSW informed patient that he has to call [employer] a day in advanced in order to schedule his trip for the next day. CSW provided patient with the number to request a ride. CSW provided patient with an [employer] information guide from the [employer]. Patient reports that he'll contact [employer] to determine if he's eligible for the discounted fair. CSW informed patient regular fair amounts is 3 per trip. There are no other needs to address at this time. CSW is available if a need were to arise. CSW is signing off.   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 ) Insurance information:  Medicare PT Recommendations:  Not assessed at this time Information / Referral to community resources:  Other (Comment Required) [facility] )  Patient/Family's Response to care:  Patient reports that he'll contact [employer] to determine if he qualifies for special program pricing.   Patient/Family's Understanding of and Emotional Response to Diagnosis,  Current Treatment, and Prognosis:  Patient was appreciative of CSW assistance. He reports he understands [employer] and that he's intersted in using it to be transported to his medical appointments.   Emotional Assessment Appearance:  Appears stated age Attitude/Demeanor/Rapport:   (None ) Affect (typically observed):  Calm, Pleasant Orientation:  Oriented to Self, Oriented to Place, Oriented to  Time Alcohol /  Substance use:  Not Applicable Psych involvement (Current and /or in the community):  No (Comment)  Discharge Needs  Concerns to be addressed:  Discharge Planning Concerns, Financial / Insurance Concerns, Other (Comment Required (Transportation Needs ) Readmission within the last 30 days:  No Current discharge risk:  Chronically ill Barriers to Discharge:  No Barriers Identified   [place], LCSW 10/21/2015, 3:56 PM

## 2015-10-21 NOTE — Clinical Documentation Improvement (Signed)
Internal Medicine  Can the diagnosis of pressure ulcer be further specified?   Document if pressure ulcer with stage is Present on Admission   Document Site with laterality - Elbow, Back (upper/lower), Sacral, Hip, Buttock, Ankle, Heel, Head, Other (Specify)  Pressure Ulcer Stage - Stage1, Stage 2, Stage 3, Stage 4, Unstageable, Unspecified, Unable to Clinically Determine  Other  Clinically Undetermined  Please exercise your independent, professional judgment when responding. A specific answer is not anticipated or expected.  Thank You,  Ezekiel Ina RN [place] [phone]

## 2015-10-21 NOTE — Discharge Instructions (Signed)
Heart Failure Clinic appointment on November 06, 2015 at 9:00am with Darylene Price, [place]. Please call [phone] to reschedule.    DIET:  Cardiac diet and Diabetic diet  DISCHARGE CONDITION:  Stable  ACTIVITY:  Activity as tolerated  OXYGEN:  Home Oxygen: No.   Oxygen Delivery: room air  DISCHARGE LOCATION:  home   If you experience worsening of your admission symptoms, develop shortness of breath, life threatening emergency, suicidal or homicidal thoughts you must seek medical attention immediately by calling 911 or calling your MD immediately  if symptoms less severe.  You Must read complete instructions/literature along with all the possible adverse reactions/side effects for all the Medicines you take and that have been prescribed to you. Take any new Medicines after you have completely understood and accpet all the possible adverse reactions/side effects.   Please note  You were cared for by a hospitalist during your hospital stay. If you have any questions about your discharge medications or the care you received while you were in the hospital after you are discharged, you can call the unit and asked to speak with the hospitalist on call if the hospitalist that took care of you is not available. Once you are discharged, your primary care physician will handle any further medical issues. Please note that NO REFILLS for any discharge medications will be authorized once you are discharged, as it is imperative that you return to your primary care physician (or establish a relationship with a primary care physician if you do not have one) for your aftercare needs so that they can reassess your need for medications and monitor your lab values.   Take lasix 3 times a day for 3 day and return to taking 2 times a day  - Daily fluids < 2 liters. - Low salt diet - Check weight everyday and keep log. Take to your doctors appt. - Take extra dose of lasix if you gain more than 3 pounds  weight.

## 2015-10-21 NOTE — Consult Note (Signed)
MEDICATION RELATED CONSULT NOTE - Follow Up  Pharmacy Consult for electroyltes Indication: hypokalemia  Allergies  Allergen Reactions  . Codeine Nausea And Vomiting    Patient Measurements: Height: 5\' 9"  (175.3 cm) Weight: 262 lb 4 oz (118.956 kg) IBW/kg (Calculated) : 70.7 Adjusted Body Weight:   Vital Signs: Temp: 97.7 F (36.5 C) (01/17 0520) Temp Source: Oral (01/17 0520) BP: 89/76 mmHg (01/17 0520) Pulse Rate: 72 (01/17 0520) Intake/Output from previous day: 01/16 0701 - 01/17 0700 In: 1179.5 [P.O.:720; IV Piggyback:459.5] Out: 2225 [Urine:2225] Intake/Output from this shift: Total I/O In: 150 [IV Piggyback:150] Out: 1150 [Urine:1150]  Labs:  Recent Labs  10/19/15 0501 10/19/15 1102 10/20/15 0505 10/21/15 0508  WBC 6.0  --  7.2  --   HGB 12.2*  --  12.0*  --   HCT 37.6*  --  36.4*  --   PLT 102*  --  119*  --   CREATININE 1.09 1.10 1.13 1.13  MG  --   --  1.3* 2.1   Estimated Creatinine Clearance: 90.7 mL/min (by C-G formula based on Cr of 1.13).   Microbiology: Recent Results (from the past 720 hour(s))  MRSA PCR Screening     Status: None   Collection Time: 10/14/15  2:05 AM  Result Value Ref Range Status   MRSA by PCR NEGATIVE NEGATIVE Final    Comment:        The GeneXpert MRSA Assay (FDA approved for NASAL specimens only), is one component of a comprehensive MRSA colonization surveillance program. It is not intended to diagnose MRSA infection nor to guide or monitor treatment for MRSA infections.     Medical History: Past Medical History  Diagnosis Date  . ASCVD (arteriosclerotic cardiovascular disease)   . Diabetes mellitus   . Hypertension   . History of cocaine abuse   . Tobacco abuse   . Depression   . Suicidal ideation   . Chronic systolic CHF (congestive heart failure) ([place])   . COPD (chronic obstructive pulmonary disease) ([place])   . Coronary artery disease     Medications:  Scheduled:  . aspirin EC  81 mg Oral Daily   . atorvastatin  20 mg Oral QPM  .  ceFAZolin (ANCEF) IV  1 g Intravenous 3 times per day  . enoxaparin (LOVENOX) injection  40 mg Subcutaneous Q24H  . escitalopram  20 mg Oral Daily  . furosemide  40 mg Intravenous Q12H  . insulin aspart  0-15 Units Subcutaneous TID WC  . insulin aspart  0-5 Units Subcutaneous QHS  . insulin glargine  40 Units Subcutaneous QHS  . metFORMIN  1,000 mg Oral BID  . metolazone  5 mg Oral Daily  . metoprolol succinate  100 mg Oral Daily  . potassium chloride  40 mEq Oral TID WC  . senna  1 tablet Oral BID    Assessment: Pt is a 60 year old male with cellulitis and lower extremity swelling, CHF. Pt is on lasix 40 q 12. Pt K this AM was 2.9. He received 140 MEQ PO and 30 MEQ IV of KCL today.   Potassium at 1741: 4.4 Mag: 1.3  Goal of Therapy:  K 3.5-5  Plan:  K 3.2, Mg 2.1. Ordered potassium chloride 40 mEq TID with meals x 3 doses and will recheck with AM labs.  Laural Benes, Pharm.D., BCPS Clinical Pharmacist 10/21/2015,6:13 AM

## 2015-10-23 LAB — WOUND CULTURE
Culture: NORMAL
Special Requests: NORMAL

## 2015-10-24 NOTE — Discharge Summary (Signed)
[facility] [place] NAME: Ronald Miller    MR#:  [id number]  DATE OF BIRTH:  09/13/1956  DATE OF ADMISSION:  10/13/2015 ADMITTING PHYSICIAN: Saundra Shelling, MD  DATE OF DISCHARGE: 10/21/2015  5:57 PM  PRIMARY CARE PHYSICIAN: Pcp Not In System    ADMISSION DIAGNOSIS:  Acute on chronic congestive heart failure, unspecified congestive heart failure type (HCC) [I50.9]  DISCHARGE DIAGNOSIS:  Principal Problem:   Dyspnea Active Problems:   Fluid overload   Pressure ulcer   SECONDARY DIAGNOSIS:   Past Medical History  Diagnosis Date  . ASCVD (arteriosclerotic cardiovascular disease)   . Diabetes mellitus   . Hypertension   . History of cocaine abuse   . Tobacco abuse   . Depression   . Suicidal ideation   . Chronic systolic CHF (congestive heart failure) ([place])   . COPD (chronic obstructive pulmonary disease) ([place])   . Coronary artery disease      ADMITTING HISTORY  HISTORY OF PRESENT ILLNESS: Ronald Miller is a 60 y.o. male with a known history of systolic heart failure, COPD, coronary artery disease, diabetes mellitus, hypertension presented to the emergency room with shortness of breath and fluid retention. He says he has been retaining fluid for the last 1-2 weeks. He has edema in both the legs, scrotal edema and edema up to the waist. Patient says he has been compliant with his medication. Complains of orthopnea and paroxysmal nocturnal dyspnea. Also complains of exertional shortness of breath .No complaints of any chest pain. He does have fluid oozing out from his legs. No history of any fever or chills. No history of any sick contacts at home or any recent travel. Workup in the emergency room revealed that patient is in heart failure. Hospitalist service was consulted for further care.   HOSPITAL COURSE:   * ac on ch systolic Heart failure with history of cardiomyopathy - IV Lasix, Beta blockers - Input and Output -  Counseled to limit fluids and Salt - Monitored Bun/Cr and Potassium -Cardiology help appreciated Patient was asked to take his oral Lasix 3 times a day for 3 more days after discharge and return to 2 times a day. By the time of discharge his shortness of breath has resolved. No further edema in the legs. -9 L during hospital stay  *Hypokalemia Potassium chloride 40 mg by mouth twice a day  *Right lower extremity cellulitis Patient was treated with broad-spectrum antibiotics during the hospital stay. Had mild worsening initially but later improved. Most of his erythema seems to be chronic stasis changes. Dr. Ola Spurr with infectious disease changed patient to IV Ancef prior to discharge. At discharge he is given a prescription for 7 more days of Keflex. Unawrap applied.  * elevated troponin due to CHF  Monitor on tele  Cardiology -recommended no interventions  * DM  Metformin and lantus, with sliding scale.  * COPD  No acute wheezing.  * hyperlipidemia  Cont atorvastatin.  Discharged home in stable condition   CONSULTS OBTAINED:  Treatment Team:  Nicholes Mango, MD Teodoro Spray, MD Adrian Prows, MD  DRUG ALLERGIES:   Allergies  Allergen Reactions  . Codeine Nausea And Vomiting    DISCHARGE MEDICATIONS:   Discharge Medication List as of 10/21/2015  5:10 PM    START taking these medications   Details  cephALEXin (KEFLEX) 500 MG capsule Take 1 capsule (500 mg total) by mouth 3 (three) times daily., Starting 10/21/2015, Until Discontinued,  Print    zolpidem (AMBIEN CR) 12.5 MG CR tablet Take 1 tablet (12.5 mg total) by mouth at bedtime as needed for sleep., Starting 10/21/2015, Until Discontinued, Print      CONTINUE these medications which have CHANGED   Details  oxyCODONE-acetaminophen (PERCOCET) 7.5-325 MG tablet Take 1 tablet by mouth every 8 (eight) hours as needed for severe pain., Starting 10/21/2015, Until Discontinued, Print    potassium chloride SA  (K-DUR,KLOR-CON) 20 MEQ tablet Take 1 tablet (20 mEq total) by mouth 2 (two) times daily., Starting 10/21/2015, Until Discontinued, No Print      CONTINUE these medications which have NOT CHANGED   Details  albuterol (PROVENTIL HFA;VENTOLIN HFA) 108 (90 BASE) MCG/ACT inhaler Inhale 2 puffs into the lungs every 6 (six) hours as needed for wheezing or shortness of breath., Until Discontinued, Historical Med    aspirin EC 81 MG tablet Take 81 mg by mouth daily., Until Discontinued, Historical Med    atorvastatin (LIPITOR) 20 MG tablet Take 20 mg by mouth every evening., Until Discontinued, Historical Med    baclofen (LIORESAL) 10 MG tablet Take 10 mg by mouth 3 (three) times daily as needed for muscle spasms. , Until Discontinued, Historical Med    benazepril (LOTENSIN) 5 MG tablet Take 1 tablet (5 mg total) by mouth daily., Starting 09/02/2015, Until Discontinued, Normal    escitalopram (LEXAPRO) 20 MG tablet Take 1 tablet (20 mg total) by mouth daily., Starting 09/02/2015, Until Discontinued, Normal    furosemide (LASIX) 40 MG tablet Take 1 tablet (40 mg total) by mouth 2 (two) times daily. Take an additional 40mg   In AM if weight over 4 lbs, Starting 09/02/2015, Until Discontinued, Normal    insulin aspart (NOVOLOG) 100 UNIT/ML injection Inject 10 Units into the skin 3 (three) times daily before meals., Starting 09/02/2015, Until Discontinued, Normal    insulin glargine (LANTUS) 100 UNIT/ML injection Inject 0.5 mLs (50 Units total) into the skin at bedtime., Starting 09/02/2015, Until Discontinued, Normal    metFORMIN (GLUCOPHAGE) 1000 MG tablet Take 1 tablet (1,000 mg total) by mouth 2 (two) times daily., Starting 09/02/2015, Until Discontinued, Normal    metolazone (ZAROXOLYN) 5 MG tablet Take 5 mg by mouth daily as needed (for 4-5 pound weight gain)., Until Discontinued, Historical Med    metoprolol succinate (TOPROL-XL) 100 MG 24 hr tablet Take 1 tablet (100 mg total) by mouth daily.  Take with or immediately following a meal., Starting 09/02/2015, Until Discontinued, Normal    predniSONE (DELTASONE) 50 MG tablet Take 1 tablet (50 mg total) by mouth daily with breakfast., Starting 09/30/2015, Until Discontinued, Print      STOP taking these medications     aspirin 325 MG tablet      spironolactone (ALDACTONE) 50 MG tablet          Today    VITAL SIGNS:  Blood pressure 105/78, pulse 69, temperature 97.6 F (36.4 C), temperature source Oral, resp. rate 17, height 5\' 9"  (1.753 m), weight 120.702 kg (266 lb 1.6 oz), SpO2 95 %.  I/O:  No intake or output data in the 24 hours ending 10/24/15 1521  PHYSICAL EXAMINATION:  Physical Exam  GENERAL:  60 y.o.-year-old patient lying in the bed with no acute distress.  LUNGS: Normal breath sounds bilaterally, no wheezing, rales,rhonchi or crepitation. No use of accessory muscles of respiration.  CARDIOVASCULAR: S1, S2 normal. No murmurs, rubs, or gallops.  ABDOMEN: Soft, non-tender, non-distended. Bowel sounds present. No organomegaly or mass.  NEUROLOGIC: Moves  all 4 extremities. PSYCHIATRIC: The patient is alert and oriented x 3.  SKIN: Lower extremity erythema much improved. Small area of ulceration on the dorsum of foot is clean  DATA REVIEW:   CBC  Recent Labs Lab 10/20/15 0505  WBC 7.2  HGB 12.0*  HCT 36.4*  PLT 119*    Chemistries   Recent Labs Lab 10/21/15 0508  NA 137  K 3.2*  CL 92*  CO2 36*  GLUCOSE 124*  BUN 33*  CREATININE 1.13  CALCIUM 8.8*  MG 2.1    Cardiac Enzymes No results for input(s): TROPONINI in the last 168 hours.  Microbiology Results  Results for orders placed or performed during the hospital encounter of 10/13/15  MRSA PCR Screening     Status: None   Collection Time: 10/14/15  2:05 AM  Result Value Ref Range Status   MRSA by PCR NEGATIVE NEGATIVE Final    Comment:        The GeneXpert MRSA Assay (FDA approved for NASAL specimens only), is one component of  a comprehensive MRSA colonization surveillance program. It is not intended to diagnose MRSA infection nor to guide or monitor treatment for MRSA infections.   Wound culture     Status: None   Collection Time: 10/20/15  3:54 PM  Result Value Ref Range Status   Specimen Description LEG RIGHT  Final   Special Requests Normal  Final   Gram Stain RARE WBC SEEN FEW GRAM POSITIVE COCCI IN PAIRS   Final   Culture NORMAL SKIN FLORA  Final   Report Status 10/23/2015 FINAL  Final    RADIOLOGY:  No results found.    Follow up with PCP in 1 week.  Management plans discussed with the patient, family and they are in agreement.  CODE STATUS:  Code Status History    Date Active Date Inactive Code Status Order ID Comments User Context   10/14/2015  1:39 AM 10/21/2015  8:57 PM Full Code [id number]  Saundra Shelling, MD Inpatient   08/28/2015  2:40 AM 09/01/2015  8:01 PM Full Code [id number]  Lance Coon, MD Inpatient      TOTAL TIME TAKING CARE OF THIS PATIENT ON DAY OF DISCHARGE: more than 30 minutes.    Hillary Bow R M.D on 10/24/2015 at 3:21 PM  Between 7am to 6pm - Pager - [phone]  After 6pm go to [email or web address] - password [email or web address] [facility]  Office  [phone]  CC: Primary care physician; Pcp Not In System     Note: This dictation was prepared with Dragon dictation along with smaller phrase technology. Any transcriptional errors that result from this process are unintentional.

## 2015-10-27 ENCOUNTER — Inpatient Hospital Stay
Admission: EM | Admit: 2015-10-27 | Discharge: 2015-11-03 | DRG: 292 | Disposition: A | Discharge status: Home / Self Care | Payer: Medicare HMO | Attending: Internal Medicine | Admitting: Internal Medicine

## 2015-10-27 ENCOUNTER — Emergency Department: Payer: Medicare HMO

## 2015-10-27 ENCOUNTER — Encounter: Payer: Self-pay | Admitting: Emergency Medicine

## 2015-10-27 DIAGNOSIS — Z7982 Long term (current) use of aspirin: Secondary | ICD-10-CM | POA: Diagnosis not present

## 2015-10-27 DIAGNOSIS — R6889 Other general symptoms and signs: Secondary | ICD-10-CM

## 2015-10-27 DIAGNOSIS — I5023 Acute on chronic systolic (congestive) heart failure: Secondary | ICD-10-CM | POA: Diagnosis present

## 2015-10-27 DIAGNOSIS — I890 Lymphedema, not elsewhere classified: Secondary | ICD-10-CM | POA: Diagnosis present

## 2015-10-27 DIAGNOSIS — Z87891 Personal history of nicotine dependence: Secondary | ICD-10-CM

## 2015-10-27 DIAGNOSIS — Z8249 Family history of ischemic heart disease and other diseases of the circulatory system: Secondary | ICD-10-CM | POA: Diagnosis not present

## 2015-10-27 DIAGNOSIS — E785 Hyperlipidemia, unspecified: Secondary | ICD-10-CM | POA: Diagnosis present

## 2015-10-27 DIAGNOSIS — N289 Disorder of kidney and ureter, unspecified: Secondary | ICD-10-CM

## 2015-10-27 DIAGNOSIS — I5030 Unspecified diastolic (congestive) heart failure: Secondary | ICD-10-CM | POA: Diagnosis not present

## 2015-10-27 DIAGNOSIS — L03115 Cellulitis of right lower limb: Secondary | ICD-10-CM | POA: Diagnosis present

## 2015-10-27 DIAGNOSIS — Z9852 Vasectomy status: Secondary | ICD-10-CM | POA: Diagnosis not present

## 2015-10-27 DIAGNOSIS — F418 Other specified anxiety disorders: Secondary | ICD-10-CM | POA: Diagnosis present

## 2015-10-27 DIAGNOSIS — R109 Unspecified abdominal pain: Secondary | ICD-10-CM

## 2015-10-27 DIAGNOSIS — Z951 Presence of aortocoronary bypass graft: Secondary | ICD-10-CM

## 2015-10-27 DIAGNOSIS — Z7984 Long term (current) use of oral hypoglycemic drugs: Secondary | ICD-10-CM | POA: Diagnosis not present

## 2015-10-27 DIAGNOSIS — J449 Chronic obstructive pulmonary disease, unspecified: Secondary | ICD-10-CM | POA: Diagnosis present

## 2015-10-27 DIAGNOSIS — Z833 Family history of diabetes mellitus: Secondary | ICD-10-CM

## 2015-10-27 DIAGNOSIS — R188 Other ascites: Secondary | ICD-10-CM

## 2015-10-27 DIAGNOSIS — R140 Abdominal distension (gaseous): Secondary | ICD-10-CM

## 2015-10-27 DIAGNOSIS — R609 Edema, unspecified: Secondary | ICD-10-CM

## 2015-10-27 DIAGNOSIS — L97509 Non-pressure chronic ulcer of other part of unspecified foot with unspecified severity: Secondary | ICD-10-CM | POA: Diagnosis present

## 2015-10-27 DIAGNOSIS — Z9049 Acquired absence of other specified parts of digestive tract: Secondary | ICD-10-CM | POA: Diagnosis not present

## 2015-10-27 DIAGNOSIS — I5022 Chronic systolic (congestive) heart failure: Secondary | ICD-10-CM | POA: Diagnosis not present

## 2015-10-27 DIAGNOSIS — I25119 Atherosclerotic heart disease of native coronary artery with unspecified angina pectoris: Secondary | ICD-10-CM | POA: Diagnosis present

## 2015-10-27 DIAGNOSIS — Z7952 Long term (current) use of systemic steroids: Secondary | ICD-10-CM | POA: Diagnosis not present

## 2015-10-27 DIAGNOSIS — Z794 Long term (current) use of insulin: Secondary | ICD-10-CM

## 2015-10-27 DIAGNOSIS — Z885 Allergy status to narcotic agent status: Secondary | ICD-10-CM | POA: Diagnosis not present

## 2015-10-27 DIAGNOSIS — I429 Cardiomyopathy, unspecified: Secondary | ICD-10-CM | POA: Diagnosis present

## 2015-10-27 DIAGNOSIS — I110 Hypertensive heart disease with heart failure: Secondary | ICD-10-CM | POA: Diagnosis present

## 2015-10-27 DIAGNOSIS — E119 Type 2 diabetes mellitus without complications: Secondary | ICD-10-CM | POA: Diagnosis present

## 2015-10-27 DIAGNOSIS — E876 Hypokalemia: Secondary | ICD-10-CM | POA: Diagnosis present

## 2015-10-27 DIAGNOSIS — R591 Generalized enlarged lymph nodes: Secondary | ICD-10-CM | POA: Diagnosis not present

## 2015-10-27 DIAGNOSIS — R590 Localized enlarged lymph nodes: Secondary | ICD-10-CM

## 2015-10-27 DIAGNOSIS — R1084 Generalized abdominal pain: Secondary | ICD-10-CM

## 2015-10-27 LAB — BASIC METABOLIC PANEL
Anion gap: 10 (ref 5–15)
BUN: 31 mg/dL — ABNORMAL HIGH (ref 6–20)
CALCIUM: 8.7 mg/dL — AB (ref 8.9–10.3)
CO2: 30 mmol/L (ref 22–32)
CREATININE: 1.47 mg/dL — AB (ref 0.61–1.24)
Chloride: 102 mmol/L (ref 101–111)
GFR calc non Af Amer: 51 mL/min — ABNORMAL LOW (ref >60)
GFR, EST AFRICAN AMERICAN: 59 mL/min — AB (ref >60)
Glucose, Bld: 141 mg/dL — ABNORMAL HIGH (ref 65–99)
Potassium: 4.2 mmol/L (ref 3.5–5.1)
Sodium: 142 mmol/L (ref 135–145)

## 2015-10-27 LAB — CBC
HCT: 38.4 % — ABNORMAL LOW (ref 40.0–52.0)
Hemoglobin: 12.6 g/dL — ABNORMAL LOW (ref 13.0–18.0)
MCH: 30.2 pg (ref 26.0–34.0)
MCHC: 32.9 g/dL (ref 32.0–36.0)
MCV: 91.7 fL (ref 80.0–100.0)
PLATELETS: 149 10*3/uL — AB (ref 150–440)
RBC: 4.19 MIL/uL — AB (ref 4.40–5.90)
RDW: 16.2 % — AB (ref 11.5–14.5)
WBC: 7.7 10*3/uL (ref 3.8–10.6)

## 2015-10-27 LAB — TROPONIN I: TROPONIN I: 0.03 ng/mL (ref <0.031)

## 2015-10-27 LAB — GLUCOSE, CAPILLARY: GLUCOSE-CAPILLARY: 188 mg/dL — AB (ref 65–99)

## 2015-10-27 LAB — BRAIN NATRIURETIC PEPTIDE: B Natriuretic Peptide: 1686 pg/mL — ABNORMAL HIGH (ref 0.0–100.0)

## 2015-10-27 MED ORDER — INSULIN GLARGINE 100 UNIT/ML ~~LOC~~ SOLN
30.0000 [IU] | Freq: Every day | SUBCUTANEOUS | Status: DC
  Administered 2015-10-27 – 2015-11-02 (×7): 30 [IU] via SUBCUTANEOUS
  Filled 2015-10-27 (×8): qty 0.3

## 2015-10-27 MED ORDER — OXYCODONE-ACETAMINOPHEN 7.5-325 MG PO TABS
1.0000 | ORAL_TABLET | Freq: Three times a day (TID) | ORAL | Status: DC | PRN
  Administered 2015-10-27: 1 via ORAL
  Filled 2015-10-27: qty 1

## 2015-10-27 MED ORDER — INSULIN ASPART 100 UNIT/ML ~~LOC~~ SOLN
0.0000 [IU] | Freq: Three times a day (TID) | SUBCUTANEOUS | Status: DC
  Administered 2015-10-28 (×2): 2 [IU] via SUBCUTANEOUS
  Administered 2015-10-29 (×2): 5 [IU] via SUBCUTANEOUS
  Administered 2015-10-30 (×2): 3 [IU] via SUBCUTANEOUS
  Administered 2015-10-31: 2 [IU] via SUBCUTANEOUS
  Administered 2015-10-31: 3 [IU] via SUBCUTANEOUS
  Administered 2015-11-01: 2 [IU] via SUBCUTANEOUS
  Administered 2015-11-01 (×2): 3 [IU] via SUBCUTANEOUS
  Administered 2015-11-02: 5 [IU] via SUBCUTANEOUS
  Administered 2015-11-02: 3 [IU] via SUBCUTANEOUS
  Administered 2015-11-03: 5 [IU] via SUBCUTANEOUS
  Filled 2015-10-27: qty 1
  Filled 2015-10-27: qty 3
  Filled 2015-10-27: qty 2
  Filled 2015-10-27: qty 3
  Filled 2015-10-27: qty 2
  Filled 2015-10-27 (×2): qty 5
  Filled 2015-10-27: qty 2
  Filled 2015-10-27: qty 3
  Filled 2015-10-27: qty 5
  Filled 2015-10-27: qty 3
  Filled 2015-10-27: qty 5
  Filled 2015-10-27 (×2): qty 3
  Filled 2015-10-27: qty 2

## 2015-10-27 MED ORDER — METOPROLOL SUCCINATE ER 100 MG PO TB24
100.0000 mg | ORAL_TABLET | Freq: Every evening | ORAL | Status: DC
  Administered 2015-10-27: 100 mg via ORAL
  Filled 2015-10-27: qty 1

## 2015-10-27 MED ORDER — BACLOFEN 10 MG PO TABS: 10.0000 mg | ORAL_TABLET | Freq: Three times a day (TID) | ORAL | Status: DC | PRN

## 2015-10-27 MED ORDER — ENOXAPARIN SODIUM 40 MG/0.4ML ~~LOC~~ SOLN
40.0000 mg | SUBCUTANEOUS | Status: DC
  Administered 2015-10-27: 40 mg via SUBCUTANEOUS
  Filled 2015-10-27: qty 0.4

## 2015-10-27 MED ORDER — ACETAMINOPHEN 325 MG PO TABS: 650.0000 mg | ORAL_TABLET | Freq: Four times a day (QID) | ORAL | Status: DC | PRN

## 2015-10-27 MED ORDER — ACETAMINOPHEN 500 MG PO TABS
1000.0000 mg | ORAL_TABLET | Freq: Four times a day (QID) | ORAL | Status: DC | PRN
  Administered 2015-10-29: 1000 mg via ORAL
  Filled 2015-10-27 (×2): qty 2

## 2015-10-27 MED ORDER — FUROSEMIDE 10 MG/ML IJ SOLN
60.0000 mg | Freq: Two times a day (BID) | INTRAMUSCULAR | Status: DC
  Administered 2015-10-27: 60 mg via INTRAVENOUS
  Filled 2015-10-27: qty 6

## 2015-10-27 MED ORDER — FUROSEMIDE 10 MG/ML IJ SOLN
40.0000 mg | Freq: Once | INTRAMUSCULAR | Status: AC
  Administered 2015-10-27: 40 mg via INTRAVENOUS
  Filled 2015-10-27: qty 4

## 2015-10-27 MED ORDER — ATORVASTATIN CALCIUM 20 MG PO TABS
20.0000 mg | ORAL_TABLET | Freq: Every evening | ORAL | Status: DC
  Administered 2015-10-27 – 2015-11-02 (×7): 20 mg via ORAL
  Filled 2015-10-27 (×8): qty 1

## 2015-10-27 MED ORDER — ALBUTEROL SULFATE (2.5 MG/3ML) 0.083% IN NEBU: 2.5000 mg | INHALATION_SOLUTION | Freq: Four times a day (QID) | RESPIRATORY_TRACT | Status: DC | PRN

## 2015-10-27 MED ORDER — ONDANSETRON HCL 4 MG/2ML IJ SOLN
4.0000 mg | Freq: Four times a day (QID) | INTRAMUSCULAR | Status: DC | PRN
  Administered 2015-10-28 (×2): 4 mg via INTRAVENOUS
  Filled 2015-10-27 (×2): qty 2

## 2015-10-27 MED ORDER — CEFAZOLIN SODIUM 1-5 GM-% IV SOLN
1.0000 g | Freq: Three times a day (TID) | INTRAVENOUS | Status: DC
  Administered 2015-10-27 – 2015-10-28 (×3): 1 g via INTRAVENOUS
  Filled 2015-10-27 (×4): qty 50

## 2015-10-27 MED ORDER — ALBUTEROL SULFATE HFA 108 (90 BASE) MCG/ACT IN AERS: 2.0000 | INHALATION_SPRAY | Freq: Four times a day (QID) | RESPIRATORY_TRACT | Status: DC | PRN

## 2015-10-27 MED ORDER — ASPIRIN EC 81 MG PO TBEC
81.0000 mg | DELAYED_RELEASE_TABLET | Freq: Every day | ORAL | Status: DC
  Administered 2015-10-27 – 2015-11-03 (×8): 81 mg via ORAL
  Filled 2015-10-27 (×8): qty 1

## 2015-10-27 MED ORDER — ESCITALOPRAM OXALATE 10 MG PO TABS
20.0000 mg | ORAL_TABLET | Freq: Every day | ORAL | Status: DC
  Administered 2015-10-27 – 2015-11-03 (×8): 20 mg via ORAL
  Filled 2015-10-27 (×8): qty 2

## 2015-10-27 MED ORDER — ONDANSETRON HCL 4 MG PO TABS
4.0000 mg | ORAL_TABLET | Freq: Four times a day (QID) | ORAL | Status: DC | PRN
  Administered 2015-10-28: 4 mg via ORAL
  Filled 2015-10-27: qty 1

## 2015-10-27 MED ORDER — CEPHALEXIN 500 MG PO CAPS: 500.0000 mg | ORAL_CAPSULE | Freq: Three times a day (TID) | ORAL | Status: DC

## 2015-10-27 MED ORDER — ACETAMINOPHEN 650 MG RE SUPP: 650.0000 mg | Freq: Four times a day (QID) | RECTAL | Status: DC | PRN

## 2015-10-27 NOTE — ED Provider Notes (Signed)
[facility] Emergency Department Provider Note  ____________________________________________  Time seen: Approximately 4:02 PM  I have reviewed the triage vital signs and the nursing notes.   HISTORY  Chief Complaint Shortness of Breath    HPI Ronald Miller Ronald Miller. is a 60 y.o. male with a history of systolic CHF, COPD, tobacco abuse and cocaine abuse, DM, HTN presenting w/ progressively worsening SOB.  Patient was discharged from the hospital last week after a CHF exacerbation, and has had progressively worsening dyspnea on exertion, orthopnea, and is now having shortness of breath at rest. He denies any chest pain, pressure or tightness. He reports increased abdominal wall edema, which is typical of his CHF flares. He has a baseline, chronic unchanged cough that is nonproductive. No fever or chills. He has chronic lymphedema bilaterally which is treated with compression; no calf tenderness. He does not weigh himself, but states he has been taking his medications as prescribed including his diuretic.   Past Medical History  Diagnosis Date  . ASCVD (arteriosclerotic cardiovascular disease)   . Diabetes mellitus   . Hypertension   . History of cocaine abuse   . Tobacco abuse   . Depression   . Suicidal ideation   . Chronic systolic CHF (congestive heart failure) ([place])   . COPD (chronic obstructive pulmonary disease) ([place])   . Coronary artery disease     Patient Active Problem List   Diagnosis Date Noted  . Pressure ulcer 10/15/2015  . Dyspnea 10/14/2015  . Fluid overload 10/14/2015  . Chronic cholecystitis with calculus   . COPD (chronic obstructive pulmonary disease) ([place]) 08/28/2015  . HTN (hypertension) 08/28/2015  . Depression 08/28/2015  . Chest pain 08/28/2015  . Elevated troponin 08/28/2015  . AICD discharge 08/28/2015  . Hypokalemia 08/28/2015  . Hypotension 08/28/2015  . Chronic systolic heart failure ([place]) 02/04/2015  . Arteriosclerosis of  coronary artery 03/05/2014  . Diabetes mellitus ([place]) 03/05/2014  . HLD (hyperlipidemia) 03/05/2014  . Type 2 diabetes mellitus ([place]) 08/06/2009  . TOBACCO ABUSE 08/06/2009  . ATHEROSCLEROTIC CARDIOVASCULAR DISEASE 08/06/2009  . LOW BACK PAIN, CHRONIC 08/06/2009    Past Surgical History  Procedure Laterality Date  . Coronary artery bypass graft  2001  . Lumbar disc surgery  02/1999    Discectomy and fusion  . Vasectomy      Subsequent reversal  . Insert / replace / remove pacemaker    . Cardiac defibrillator placement    . Cholecystectomy N/A 08/30/2015    Procedure: LAPAROSCOPIC CHOLECYSTECTOMY;  Surgeon: Hubbard Robinson, MD;  Location: [name];  Service: General;  Laterality: N/A;  . Cardiac defibrillator placement      Current Outpatient Rx  Name  Route  Sig  Dispense  Refill  . acetaminophen (TYLENOL) 500 MG tablet   Oral   Take 1,000 mg by mouth every 6 (six) hours as needed for mild pain or headache.         . albuterol (PROVENTIL HFA;VENTOLIN HFA) 108 (90 BASE) MCG/ACT inhaler   Inhalation   Inhale 2 puffs into the lungs every 6 (six) hours as needed for wheezing or shortness of breath.         Marland Kitchen aspirin EC 81 MG tablet   Oral   Take 81 mg by mouth daily.         Marland Kitchen atorvastatin (LIPITOR) 20 MG tablet   Oral   Take 20 mg by mouth every evening.         Marland Kitchen  baclofen (LIORESAL) 10 MG tablet   Oral   Take 10 mg by mouth 3 (three) times daily as needed for muscle spasms.          . benazepril (LOTENSIN) 5 MG tablet   Oral   Take 1 tablet (5 mg total) by mouth daily.   30 tablet   0   . cephALEXin (KEFLEX) 500 MG capsule   Oral   Take 1 capsule (500 mg total) by mouth 3 (three) times daily.   21 capsule   0   . escitalopram (LEXAPRO) 20 MG tablet   Oral   Take 1 tablet (20 mg total) by mouth daily.   30 tablet   0   . furosemide (LASIX) 40 MG tablet   Oral   Take 40-80 mg by mouth 2 (two) times daily. Pt takes two tablets in the morning  and one in the evening.         . insulin aspart (NOVOLOG) 100 UNIT/ML injection   Subcutaneous   Inject 10 Units into the skin 3 (three) times daily before meals.   10 mL   11   . insulin glargine (LANTUS) 100 UNIT/ML injection   Subcutaneous   Inject 30 Units into the skin at bedtime.         . metFORMIN (GLUCOPHAGE) 1000 MG tablet   Oral   Take 1 tablet (1,000 mg total) by mouth 2 (two) times daily.   60 tablet   0   . metolazone (ZAROXOLYN) 5 MG tablet   Oral   Take 5 mg by mouth daily as needed (for 4-5 pound weight gain).         . metoprolol succinate (TOPROL-XL) 100 MG 24 hr tablet   Oral   Take 100 mg by mouth every evening.         . potassium chloride SA (K-DUR,KLOR-CON) 20 MEQ tablet   Oral   Take 1 tablet (20 mEq total) by mouth 2 (two) times daily. Patient taking differently: Take 20 mEq by mouth daily.          Marland Kitchen oxyCODONE-acetaminophen (PERCOCET) 7.5-325 MG tablet   Oral   Take 1 tablet by mouth every 8 (eight) hours as needed for severe pain. Patient not taking: Reported on 10/27/2015   20 tablet   0   . predniSONE (DELTASONE) 50 MG tablet   Oral   Take 1 tablet (50 mg total) by mouth daily with breakfast. Patient not taking: Reported on 10/13/2015   5 tablet   0   . zolpidem (AMBIEN CR) 12.5 MG CR tablet   Oral   Take 1 tablet (12.5 mg total) by mouth at bedtime as needed for sleep. Patient not taking: Reported on 10/27/2015   10 tablet   0     Allergies Codeine  Family History  Problem Relation Age of Onset  . CAD Father   . Diabetes Father   . Heart failure Father     Social History Social History  Substance Use Topics  . Smoking status: Former Smoker -- 1.50 packs/day for 23 years    Types: Cigarettes  . Smokeless tobacco: Never Used  . Alcohol Use: No    Review of Systems Constitutional: No fever/chills. No lightheadedness or syncope. Unknown weight change. Eyes: No visual changes. ENT: No sore  throat. Cardiovascular: Denies chest pain, palpitations. Respiratory: Positive shortness of breath.  Positive chronic unchanged cough. Gastrointestinal: No abdominal pain.  No nausea, no vomiting.  No diarrhea.  No constipation. Genitourinary: Negative for dysuria. Musculoskeletal: Negative for back pain. Positive bilateral lower extremity edema. Skin: Negative for rash. Neurological: Negative for headaches, focal weakness or numbness.  10-point ROS otherwise negative.  ____________________________________________   PHYSICAL EXAM:  VITAL SIGNS: ED Triage Vitals  Enc Vitals Group     BP 10/27/15 1049 92/65 mmHg     Pulse Rate 10/27/15 1049 78     Resp 10/27/15 1049 18     Temp 10/27/15 1049 97.7 F (36.5 C)     Temp Source 10/27/15 1049 Oral     SpO2 10/27/15 1530 97 %     Weight 10/27/15 1049 263 lb (119.296 kg)     Height 10/27/15 1049 5\' 9"  (1.753 m)     Head Cir --      Peak Flow --      Pain Score 10/27/15 1530 8     Pain Loc --      Pain Edu? --      Excl. in [place]? --     Constitutional: Patient is alert and oriented and answering questions appropriate. He is chronically ill-appearing. He is laying comfortably in the stretcher in no acute distress. Eyes: Conjunctivae are normal.  EOMI. no scleral icterus. Head: Atraumatic. Nose: No congestion/rhinnorhea. Mouth/Throat: Mucous membranes are moist.  Neck: No stridor.  Supple.  No JVD. Cardiovascular: Normal rate, regular rhythm. No murmurs, rubs or gallops.  Respiratory: Normal respiratory effort.  No retractions. Lungs CTAB.  No wheezes, rales or ronchi. Gastrointestinal: Obese. Soft and nontender. No distention. No peritoneal signs. Musculoskeletal: On the left lower extremity, the patient has compression stockings with Ace wrap. The right lower extremity has some pitting edema below over his compression stockings. Neurologic:  Normal speech and language. No gross focal neurologic deficits are appreciated.  Skin:   Skin is warm, dry and intact. No rash noted. Psychiatric: Mood and affect are normal. Speech and behavior are normal.  Normal judgement.  ____________________________________________   LABS (all labs ordered are listed, but only abnormal results are displayed)  Labs Reviewed  BASIC METABOLIC PANEL - Abnormal; Notable for the following:    Glucose, Bld 141 (*)    BUN 31 (*)    Creatinine, Ser 1.47 (*)    Calcium 8.7 (*)    GFR calc non Af Amer 51 (*)    GFR calc Af Amer 59 (*)    All other components within normal limits  CBC - Abnormal; Notable for the following:    RBC 4.19 (*)    Hemoglobin 12.6 (*)    HCT 38.4 (*)    RDW 16.2 (*)    Platelets 149 (*)    All other components within normal limits  BRAIN NATRIURETIC PEPTIDE - Abnormal; Notable for the following:    B Natriuretic Peptide 1686.0 (*)    All other components within normal limits  TROPONIN I   ____________________________________________  EKG  ED ECG REPORT I, Eula Listen, the attending physician, personally viewed and interpreted this ECG.   Date: 10/27/2015  EKG Time: 1117  Rate: 79  Rhythm: paced  Axis: Normal  Intervals:prolonged QTc  ST&T Change: paced  ____________________________________________  RADIOLOGY  Dg Chest 2 View  10/27/2015  CLINICAL DATA:  Shortness of breath, worsening the past 2 days. Chest tightness. 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 Prior CABG. Cardiomegaly. No confluent opacities, effusions or edema. No acute bony abnormality. IMPRESSION: Cardiomegaly.  No active disease. Electronically  Signed   By: Rolm Baptise M.D.   On: 10/27/2015 11:47    ____________________________________________   PROCEDURES  Procedure(s) performed: None  Critical Care performed: No ____________________________________________   INITIAL IMPRESSION / ASSESSMENT AND PLAN / ED COURSE  Pertinent labs & imaging results that were  available during my care of the patient were reviewed by me and considered in my medical decision making (see chart for details).  60 y.o. male with a history of CHF status post AICD, CAD status post CABG, COPD presenting with progressively worsening shortness of breath 1 week. It is possible that this is his CHF and he is having acute on chronic exacerbation given his abdominal edema. He does not have signs of pulmonary edema on my exam. Consider COPD flare although I would have expected some change in his cough which she denies. Pneumonia or PE are much less likely.  ----------------------------------------- 6:31 PM on 10/27/2015 -----------------------------------------  The patient remains hemodynamically stable. His BNP is 1600 which is less than his previous, but still elevated. He was able to maintain his oxygen saturation with ambulation, but did have significant amount of shortness of breath and discomfort. Today, he has some renal insufficiency, and while his CHF is not as bad as the last time, I am concerned that he will be difficult to balance between fluid status and kidney function. Plan admission to the hospital.  ____________________________________________  FINAL CLINICAL IMPRESSION(S) / ED DIAGNOSES  Final diagnoses:  Acute on chronic systolic congestive heart failure (HCC)  Decreased exercise tolerance  Acute renal insufficiency      NEW MEDICATIONS STARTED DURING THIS VISIT:  New Prescriptions   No medications on file     Eula Listen, MD 10/27/15 [phone]

## 2015-10-27 NOTE — ED Notes (Signed)
Dressing removed from right foot - compression stocking and shoe removed from left foot

## 2015-10-27 NOTE — ED Notes (Signed)
Shortness of breath worsening over the past two days.  States abd swelling more over the past few days.  Discharge from the hospital a week ago for congestive heart failure.

## 2015-10-27 NOTE — H&P (Signed)
[place] [facility] [place] NAME: Ronald Miller    MR#:  [id number]  DATE OF BIRTH:  [birth year]  DATE OF ADMISSION:  10/27/2015  PRIMARY CARE PHYSICIAN: Pcp Not In System   REQUESTING/REFERRING PHYSICIAN: Dr. Mariea Clonts  CHIEF COMPLAINT:   Chief Complaint  Patient presents with  . Shortness of Breath    HISTORY OF PRESENT ILLNESS:  Ronald Miller  is a 60 y.o. male with a known history of diabetes mellitus, hypertension, chronic systolic heart failure and was recently discharged on 17th of January comes back because of shortness of breath, orthopnea, increased abdominal wall edema, pedal edema. Patient feels dyspnea on exertion. He says it's more for the last few days. Complains of leg pains, he was recently treated for leg cellulitis,. And had compression dressings, they were removed today noted to have an open blister on the dorsum of the right foot. No chest pain.  PAST MEDICAL HISTORY:   Past Medical History  Diagnosis Date  . ASCVD (arteriosclerotic cardiovascular disease)   . Diabetes mellitus   . Hypertension   . History of cocaine abuse   . Tobacco abuse   . Depression   . Suicidal ideation   . Chronic systolic CHF (congestive heart failure) ([place])   . COPD (chronic obstructive pulmonary disease) ([place])   . Coronary artery disease     PAST SURGICAL HISTOIRY:   Past Surgical History  Procedure Laterality Date  . Coronary artery bypass graft  2001  . Lumbar disc surgery  02/1999    Discectomy and fusion  . Vasectomy      Subsequent reversal  . Insert / replace / remove pacemaker    . Cardiac defibrillator placement    . Cholecystectomy N/A 08/30/2015    Procedure: LAPAROSCOPIC CHOLECYSTECTOMY;  Surgeon: Hubbard Robinson, MD;  Location: [name];  Service: General;  Laterality: N/A;  . Cardiac defibrillator placement      SOCIAL HISTORY:   Social History  Substance Use Topics  . Smoking status: Former Smoker -- 1.50  packs/day for 23 years    Types: Cigarettes  . Smokeless tobacco: Never Used  . Alcohol Use: No    FAMILY HISTORY:   Family History  Problem Relation Age of Onset  . CAD Father   . Diabetes Father   . Heart failure Father     DRUG ALLERGIES:   Allergies  Allergen Reactions  . Codeine Nausea And Vomiting    REVIEW OF SYSTEMS:  CONSTITUTIONAL: Dyspnea on exertion. EYES: No blurred or double vision.  EARS, NOSE, AND THROAT: No tinnitus or ear pain.  RESPIRATORY: Shortness of breath,  CARDIOVASCULAR: No chest pain, has orthopnea orthopnea, and edema.  GASTROINTESTINAL: No nausea, vomiting, diarrhea or abdominal pain.  GENITOURINARY: No dysuria, hematuria.  ENDOCRINE: No polyuria, nocturia,  HEMATOLOGY: No anemia, easy bruising or bleeding SKIN: Has open ulcer on the dorsum of the right foot. MUSCULOSKELETAL: No joint pain or arthritis.   NEUROLOGIC: No tingling, numbness, weakness.  PSYCHIATRY: No anxiety or depression.   MEDICATIONS AT HOME:   Prior to Admission medications   Medication Sig Start Date End Date Taking? Authorizing Provider  acetaminophen (TYLENOL) 500 MG tablet Take 1,000 mg by mouth every 6 (six) hours as needed for mild pain or headache.   Yes Historical Provider, MD  albuterol (PROVENTIL HFA;VENTOLIN HFA) 108 (90 BASE) MCG/ACT inhaler Inhale 2 puffs into the lungs every 6 (six) hours as needed for wheezing or shortness of breath.  Yes Historical Provider, MD  aspirin EC 81 MG tablet Take 81 mg by mouth daily.   Yes Historical Provider, MD  atorvastatin (LIPITOR) 20 MG tablet Take 20 mg by mouth every evening.   Yes Historical Provider, MD  baclofen (LIORESAL) 10 MG tablet Take 10 mg by mouth 3 (three) times daily as needed for muscle spasms.    Yes Historical Provider, MD  benazepril (LOTENSIN) 5 MG tablet Take 1 tablet (5 mg total) by mouth daily. 09/02/15  Yes Vipul Manuella Ghazi, MD  cephALEXin (KEFLEX) 500 MG capsule Take 1 capsule (500 mg total) by mouth 3  (three) times daily. 10/21/15  Yes Srikar Sudini, MD  escitalopram (LEXAPRO) 20 MG tablet Take 1 tablet (20 mg total) by mouth daily. 09/02/15  Yes Max Sane, MD  furosemide (LASIX) 40 MG tablet Take 40-80 mg by mouth 2 (two) times daily. Pt takes two tablets in the morning and one in the evening.   Yes Historical Provider, MD  insulin aspart (NOVOLOG) 100 UNIT/ML injection Inject 10 Units into the skin 3 (three) times daily before meals. 09/02/15  Yes Vipul Manuella Ghazi, MD  insulin glargine (LANTUS) 100 UNIT/ML injection Inject 30 Units into the skin at bedtime.   Yes Historical Provider, MD  metFORMIN (GLUCOPHAGE) 1000 MG tablet Take 1 tablet (1,000 mg total) by mouth 2 (two) times daily. 09/02/15  Yes Vipul Manuella Ghazi, MD  metolazone (ZAROXOLYN) 5 MG tablet Take 5 mg by mouth daily as needed (for 4-5 pound weight gain).   Yes Historical Provider, MD  metoprolol succinate (TOPROL-XL) 100 MG 24 hr tablet Take 100 mg by mouth every evening.   Yes Historical Provider, MD  potassium chloride SA (K-DUR,KLOR-CON) 20 MEQ tablet Take 1 tablet (20 mEq total) by mouth 2 (two) times daily. Patient taking differently: Take 20 mEq by mouth daily.  10/21/15  Yes Srikar Sudini, MD  oxyCODONE-acetaminophen (PERCOCET) 7.5-325 MG tablet Take 1 tablet by mouth every 8 (eight) hours as needed for severe pain. Patient not taking: Reported on 10/27/2015 10/21/15   Hillary Bow, MD  predniSONE (DELTASONE) 50 MG tablet Take 1 tablet (50 mg total) by mouth daily with breakfast. Patient not taking: Reported on 10/13/2015 09/30/15   Lavonia Drafts, MD  zolpidem (AMBIEN CR) 12.5 MG CR tablet Take 1 tablet (12.5 mg total) by mouth at bedtime as needed for sleep. Patient not taking: Reported on 10/27/2015 10/21/15   Hillary Bow, MD      VITAL SIGNS:  Blood pressure 106/79, pulse 78, temperature 97.7 F (36.5 C), temperature source Oral, resp. rate 18, height 5\' 9"  (1.753 m), weight 119.296 kg (263 lb), SpO2 99 %.  PHYSICAL EXAMINATION:   GENERAL:  60 y.o.-year-old patient lying in the bed with no acute distress.  EYES: Pupils equal, round, reactive to light and accommodation. No scleral icterus. Extraocular muscles intact.  HEENT: Head atraumatic, normocephalic. Oropharynx and nasopharynx clear.  NECK:  Supple, no jugular venous distention. No thyroid enlargement, no tenderness.  LUNGS: Bilateral basilar crepitations present ,no wheezing, rales,rhonchi or crepitation. No use of accessory muscles of respiration.  CARDIOVASCULAR: S1, S2 normal. No murmurs, rubs, or gallops.  ABDOMEN: Soft, nontender, nondistended. Bowel sounds present. No organomegaly or mass. Abdomen is distended. EXTREMITIES: Bilateral leg edema present cyanosis, or clubbing.  NEUROLOGIC: Cranial nerves II through XII are intact. Muscle strength 5/5 in all extremities. Sensation intact. Gait not checked.  PSYCHIATRIC: The patient is alert and oriented x 3.  SKIN: Open blister present on dorsum of the right  foot.  LABORATORY PANEL:   CBC  Recent Labs Lab 10/27/15 1123  WBC 7.7  HGB 12.6*  HCT 38.4*  PLT 149*   ------------------------------------------------------------------------------------------------------------------  Chemistries   Recent Labs Lab 10/21/15 0508 10/27/15 1123  NA 137 142  K 3.2* 4.2  CL 92* 102  CO2 36* 30  GLUCOSE 124* 141*  BUN 33* 31*  CREATININE 1.13 1.47*  CALCIUM 8.8* 8.7*  MG 2.1  --    ------------------------------------------------------------------------------------------------------------------  Cardiac Enzymes  Recent Labs Lab 10/27/15 1123  TROPONINI 0.03   ------------------------------------------------------------------------------------------------------------------  RADIOLOGY:  Dg Chest 2 View  10/27/2015  CLINICAL DATA:  Shortness of breath, worsening the past 2 days. Chest tightness. 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 Prior  CABG. Cardiomegaly. No confluent opacities, effusions or edema. No acute bony abnormality. IMPRESSION: Cardiomegaly.  No active disease. Electronically Signed   By: Rolm Baptise M.D.   On: 10/27/2015 11:47    EKG:   Orders placed or performed during the hospital encounter of 10/27/15  . ED EKG within 10 minutes  . ED EKG within 10 minutes  . EKG 12-Lead  . EKG 12-Lead   Electronic ventricular pacemaker 79 bpm T wave inversions in V1 and V2 V3 and V4 V5 and V6. IMPRESSION AND PLAN:   #1 acute on chronic systolic heart failure: EF 20-25% by echo done recently. Patient may need fine-tuning of his diuretics. Because of his dyspnea with minimal exertion he prefers to stay overnight. Continue IV Lasix. Check daily weights, limit fluid intake to 1.5 L a day. And limit salt intake also. Because of worsening leg edema also abdominal wall edema patient needs IV Lasix at this time. #2  Right leg cellulitis: Cellulitis improved but now has open blister on the dorsum of the right foot: Started on IV Ancef p.atient was given Keflex and seen by ID recently. We will reconsult ID   #3. History of COPD: No wheezing at this time  #4 diabetes mellitus type 2: Continue Lantus, sliding scale with coverage but hold metformin secondary to renal insufficiency. 5.Acute renal failure likely secondary to poor forward flow from CHF: Monitor kidney function, continue IV Lasix at this time 6 . elevated troponins: Likely secondary to CHF exacerbation: They are better than before 0.03 at this time on previous admission troponin it was 0.07 . All the records are reviewed and case discussed with ED provider. Management plans discussed with the patient, family and they are in agreement.  CODE STATUS: full  TOTAL TIME TAKING CARE OF THIS PATIENT: 53minutes.    Epifanio Lesches M.D on 10/27/2015 at 7:18 PM  Between 7am to 6pm - Pager - [phone]  After 6pm go to [email or web address] - password [email or web address]  Office  [phone]  CC: Primary care physician; Pcp Not In System  Note: This dictation was prepared with Dragon dictation along with smaller phrase technology. Any transcriptional errors that result from this process are unintentional.

## 2015-10-28 LAB — CBC
HCT: 37.4 % — ABNORMAL LOW (ref 40.0–52.0)
Hemoglobin: 11.9 g/dL — ABNORMAL LOW (ref 13.0–18.0)
MCH: 29.3 pg (ref 26.0–34.0)
MCHC: 31.9 g/dL — ABNORMAL LOW (ref 32.0–36.0)
MCV: 92.1 fL (ref 80.0–100.0)
PLATELETS: 140 10*3/uL — AB (ref 150–440)
RBC: 4.06 MIL/uL — AB (ref 4.40–5.90)
RDW: 16.2 % — AB (ref 11.5–14.5)
WBC: 5.4 10*3/uL (ref 3.8–10.6)

## 2015-10-28 LAB — BASIC METABOLIC PANEL
Anion gap: 6 (ref 5–15)
BUN: 28 mg/dL — AB (ref 6–20)
CALCIUM: 8.5 mg/dL — AB (ref 8.9–10.3)
CO2: 31 mmol/L (ref 22–32)
CREATININE: 1.21 mg/dL (ref 0.61–1.24)
Chloride: 102 mmol/L (ref 101–111)
GFR calc non Af Amer: >60 mL/min (ref >60)
Glucose, Bld: 187 mg/dL — ABNORMAL HIGH (ref 65–99)
Potassium: 3.5 mmol/L (ref 3.5–5.1)
SODIUM: 139 mmol/L (ref 135–145)

## 2015-10-28 LAB — GLUCOSE, CAPILLARY
GLUCOSE-CAPILLARY: 96 mg/dL (ref 65–99)
GLUCOSE-CAPILLARY: 142 mg/dL — AB (ref 65–99)
GLUCOSE-CAPILLARY: 168 mg/dL — AB (ref 65–99)
Glucose-Capillary: 147 mg/dL — ABNORMAL HIGH (ref 65–99)

## 2015-10-28 MED ORDER — POTASSIUM CHLORIDE CRYS ER 20 MEQ PO TBCR
20.0000 meq | EXTENDED_RELEASE_TABLET | Freq: Every day | ORAL | Status: DC
  Administered 2015-10-28 – 2015-11-03 (×7): 20 meq via ORAL
  Filled 2015-10-28 (×7): qty 1

## 2015-10-28 MED ORDER — OXYCODONE-ACETAMINOPHEN 7.5-325 MG PO TABS
1.0000 | ORAL_TABLET | Freq: Three times a day (TID) | ORAL | Status: DC | PRN
  Administered 2015-10-28 – 2015-11-02 (×10): 1 via ORAL
  Filled 2015-10-28 (×10): qty 1

## 2015-10-28 MED ORDER — CEPHALEXIN 500 MG PO CAPS
500.0000 mg | ORAL_CAPSULE | Freq: Three times a day (TID) | ORAL | Status: AC
  Administered 2015-10-28 – 2015-10-31 (×10): 500 mg via ORAL
  Filled 2015-10-28 (×11): qty 1

## 2015-10-28 NOTE — Progress Notes (Signed)
Ronald Miller. is a 60 y.o. male patient admitted from ED awake, alert - oriented  X 4 - no acute distress noted.  VSS - Blood pressure 124/87, pulse 69, temperature 97.6 F (36.4 C), temperature source Oral, resp. rate 20, height 5\' 9"  (1.753 m), weight 118.525 kg (261 lb 4.8 oz), SpO2 94 %.    IV in place, occlusive dsg intact without redness.  Orientation to room, and floor completed with information packet given to patient/family. Admission INP armband ID verified with patient/family, and in place.   SR up x 2, fall assessment complete, with patient and family able to verbalize understanding of risk associated with falls,. Call light within reach, patient able to voice, and demonstrate understanding.   Skin assessed with Apolonio Schneiders, RN.   Will cont to eval and treat per MD orders.  Rachael Fee, RN

## 2015-10-28 NOTE — Progress Notes (Signed)
Patient currently has an appointment scheduled at the [facility] on November 06, 2015 at 9:00am. Thank you.

## 2015-10-28 NOTE — Progress Notes (Signed)
Dr. Leslye Peer rounding now. Aware of BP trends. MD has placed orders

## 2015-10-28 NOTE — Progress Notes (Signed)
Patient has dx of CHF, reduced ejection fraction. Pt is on an ACE at home. Currently not on ACE because of hypotension.  Ramond Dial, Pharm.D Clinical Pharmacist

## 2015-10-28 NOTE — Progress Notes (Signed)
Patient ID: Ronald Miller., male   DOB: 08-Jul-1956, 60 y.o.   MRN: [id number] [facility] PROGRESS NOTE  Ronald Miller. [id number] DOB: [birth year] DOA: 10/27/2015 PCP: Pcp Not In System  HPI/Subjective: Patient feeling miserable. Short of breath with any limited movement. He also has swelling of his legs and an area where pus is coming out on the top of his foot.  Objective: Filed Vitals:   10/28/15 0928 10/28/15 1112  BP:  97/72  Pulse: 66 64  Temp: 97.5 F (36.4 C) 97.9 F (36.6 C)  Resp:  18    Filed Weights   10/27/15 1049 10/27/15 2149 10/28/15 0502  Weight: 119.296 kg (263 lb) 118.525 kg (261 lb 4.8 oz) 118.525 kg (261 lb 4.8 oz)    ROS: Review of Systems  Constitutional: Negative for fever and chills.  Eyes: Negative for blurred vision.  Respiratory: Positive for shortness of breath. Negative for cough.   Cardiovascular: Negative for chest pain.  Gastrointestinal: Positive for nausea and abdominal pain. Negative for vomiting, diarrhea and constipation.  Genitourinary: Negative for dysuria.  Musculoskeletal: Negative for joint pain.  Neurological: Negative for dizziness and headaches.   Exam: Physical Exam  Constitutional: He is oriented to person, place, and time.  HENT:  Nose: No mucosal edema.  Mouth/Throat: No oropharyngeal exudate or posterior oropharyngeal edema.  Eyes: Conjunctivae, EOM and lids are normal. Pupils are equal, round, and reactive to light.  Neck: No JVD present. Carotid bruit is not present. No edema present. No thyroid mass and no thyromegaly present.  Cardiovascular: S1 normal and S2 normal.  Exam reveals no gallop.   No murmur heard. Pulses:      Dorsalis pedis pulses are 0 on the right side, and 0 on the left side.  Respiratory: No respiratory distress. He has decreased breath sounds in the right lower field and the left lower field. He has no wheezes. He has no rhonchi. He has no rales.  GI: Soft. Bowel sounds are  normal. There is no tenderness.  Musculoskeletal:       Right ankle: He exhibits swelling.       Left ankle: He exhibits swelling.  Lymphadenopathy:    He has no cervical adenopathy.  Neurological: He is alert and oriented to person, place, and time. No cranial nerve deficit.  Skin: Skin is warm. No rash noted. Nails show no clubbing.  Psychiatric: He has a normal mood and affect.      Data Reviewed: Basic Metabolic Panel:  Recent Labs Lab 10/27/15 1123 10/28/15 0444  NA 142 139  K 4.2 3.5  CL 102 102  CO2 30 31  GLUCOSE 141* 187*  BUN 31* 28*  CREATININE 1.47* 1.21  CALCIUM 8.7* 8.5*   CBC:  Recent Labs Lab 10/27/15 1123 10/28/15 0444  WBC 7.7 5.4  HGB 12.6* 11.9*  HCT 38.4* 37.4*  MCV 91.7 92.1  PLT 149* 140*    BNP (last 3 results)  Recent Labs  09/30/15 1056 10/13/15 1942 10/27/15 1123  BNP 2375.0* 2776.0* 1686.0*    CBG:  Recent Labs Lab 10/27/15 2217 10/28/15 0722 10/28/15 1110 10/28/15 1611  GLUCAP 188* 96 142* 147*    Recent Results (from the past 240 hour(s))  Wound culture     Status: None   Collection Time: 10/20/15  3:54 PM  Result Value Ref Range Status   Specimen Description LEG RIGHT  Final   Special Requests Normal  Final   Gram Stain RARE  WBC SEEN FEW GRAM POSITIVE COCCI IN PAIRS   Final   Culture NORMAL SKIN FLORA  Final   Report Status 10/23/2015 FINAL  Final     Studies: Dg Chest 2 View  10/27/2015  CLINICAL DATA:  Shortness of breath, worsening the past 2 days. Chest tightness. 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 Prior CABG. Cardiomegaly. No confluent opacities, effusions or edema. No acute bony abnormality. IMPRESSION: Cardiomegaly.  No active disease. Electronically Signed   By: Rolm Baptise M.D.   On: 10/27/2015 11:47    Scheduled Meds: . aspirin EC  81 mg Oral Daily  . atorvastatin  20 mg Oral QPM  . cephALEXin  500 mg Oral 3 times per day  .  enoxaparin (LOVENOX) injection  40 mg Subcutaneous Q24H  . escitalopram  20 mg Oral Daily  . insulin aspart  0-15 Units Subcutaneous TID WC  . insulin glargine  30 Units Subcutaneous QHS  . potassium chloride  20 mEq Oral Daily    Assessment/Plan:  1. Relative hypotension- holding benazepril, Lasix, Toprol, metolozone. Restart once blood pressure higher. Hold off giving a fluid bolus at this point since he does have history of CHF. 2. Acute on chronic systolic congestive heart failure with cardiomyopathy and lower extremity anasarca. Patient was given IV Lasix on presentation and blood pressure is low so I will have to hold medication at this point. 3. Cellulitis of the right lower extremity with ulcer on the foot. Case discussed with Dr. Ola Spurr infectious disease doctor and he switched over to Keflex. 4. Anxiety depression on Lexapro. 5. Diabetes type 2 on glargine insulin and sliding scale. 6. Acute renal insufficiency- monitor closely with diuresis 7. Hypokalemia replace potassium 8. Hyperlipidemia unspecified on atorvastatin 9. Abdominal distention- get an ultrasound of the right upper quadrant and check for ascites.  10. Lymphadenopathy in the chest unclear etiology   Code Status:     Code Status Orders        Start     Ordered   10/27/15 1915  Full code   Continuous     10/27/15 1917    Code Status History    Date Active Date Inactive Code Status Order ID Comments User Context   10/14/2015  1:39 AM 10/21/2015  8:57 PM Full Code [id number]  Saundra Shelling, MD Inpatient   08/28/2015  2:40 AM 09/01/2015  8:01 PM Full Code [id number]  Lance Coon, MD Inpatient     Family Communication: Mother at bedside Disposition Plan: TBD  Antibiotics:  Keflex  Time spent: 30 minutes  Loletha Grayer  [facility]

## 2015-10-28 NOTE — Progress Notes (Signed)
Pt BP running low, 86/62. When in trendelenburg BP is 89/53. MD Dr. Estanislado Pandy notified. Orders given to hold am dose of lasix. RN will continue to monitor. Rachael Fee, RN

## 2015-10-28 NOTE — Consult Note (Signed)
[facility] Infectious Disease     Reason for Consult:  LE cellulitis Referring Physician: Nicholes Mango  Date of Admission:  10/27/2015   Active Problems:   Acute on chronic systolic CHF (congestive heart failure) (HCC)   HPI: Ronald Miller. is a 60 y.o. male with a known history of systolic heart failure EF 20-25% , COPD, coronary artery disease, diabetes mellitus, hypertension recently admitted 1/10-1/17 with CHF exacerbation with LE edema, scrotal edema edema to waist, orthopnea. He had fluid oozing from legs per report.  During that admit he was afebrile and wbc 9.  Started on IV ctx 1/12 for LE redness. Then changed 1/13 to vanco zosyn and today to daptomycin.  LE doppler neg  We discharged him with unna wrap. Readmitted with CHF exacerbation. Leg is much improved but still has shallow ulcer.   Past Medical History  Diagnosis Date  . ASCVD (arteriosclerotic cardiovascular disease)   . Diabetes mellitus   . Hypertension   . History of cocaine abuse   . Tobacco abuse   . Depression   . Suicidal ideation   . Chronic systolic CHF (congestive heart failure) ([place])   . COPD (chronic obstructive pulmonary disease) ([place])   . Coronary artery disease    Past Surgical History  Procedure Laterality Date  . Coronary artery bypass graft  2001  . Lumbar disc surgery  02/1999    Discectomy and fusion  . Vasectomy      Subsequent reversal  . Insert / replace / remove pacemaker    . Cardiac defibrillator placement    . Cholecystectomy N/A 08/30/2015    Procedure: LAPAROSCOPIC CHOLECYSTECTOMY;  Surgeon: Hubbard Robinson, MD;  Location: [name];  Service: General;  Laterality: N/A;  . Cardiac defibrillator placement     Social History  Substance Use Topics  . Smoking status: Former Smoker -- 1.50 packs/day for 23 years    Types: Cigarettes    Quit date: 08/27/2015  . Smokeless tobacco: Never Used  . Alcohol Use: No   Family History  Problem Relation Age of Onset  . CAD  Father   . Diabetes Father   . Heart failure Father     Allergies:  Allergies  Allergen Reactions  . Codeine Nausea And Vomiting    Current antibiotics: Antibiotics Given (last 72 hours)    Date/Time Action Medication Dose Rate   10/27/15 2228 Given   ceFAZolin (ANCEF) IVPB 1 g/50 mL premix 1 g 100 mL/hr   10/28/15 0534 Given   ceFAZolin (ANCEF) IVPB 1 g/50 mL premix 1 g 100 mL/hr   10/28/15 1335 Given   ceFAZolin (ANCEF) IVPB 1 g/50 mL premix 1 g 100 mL/hr      MEDICATIONS: . aspirin EC  81 mg Oral Daily  . atorvastatin  20 mg Oral QPM  .  ceFAZolin (ANCEF) IV  1 g Intravenous 3 times per day  . enoxaparin (LOVENOX) injection  40 mg Subcutaneous Q24H  . escitalopram  20 mg Oral Daily  . insulin aspart  0-15 Units Subcutaneous TID WC  . insulin glargine  30 Units Subcutaneous QHS    Review of Systems - 11 systems reviewed and negative per HPI   OBJECTIVE: Temp:  [97.5 F (36.4 C)-97.9 F (36.6 C)] 97.9 F (36.6 C) (01/24 1112) Pulse Rate:  [62-78] 64 (01/24 1112) Resp:  [17-23] 18 (01/24 1112) BP: (85-132)/(53-92) 97/72 mmHg (01/24 1112) SpO2:  [94 %-100 %] 97 % (01/24 1112) Weight:  [118.525 kg (261  lb 4.8 oz)] 118.525 kg (261 lb 4.8 oz) (01/24 0502) Physical Exam  Constitutional: He is oriented to person, place, and time. He appears well-developed and well-nourished. No distress.  HENT:  Mouth/Throat: Oropharynx is clear and moist. No oropharyngeal exudate.  Cardiovascular: Normal rate, regular rhythm and normal heart sounds. Exam reveals no gallop and no friction rub.  No murmur heard.  Pulmonary/Chest: Effort normal and breath sounds normal. No respiratory distress. He has no wheezes.  Abdominal: Soft. Bowel sounds are normal. He exhibits no distension. There is no tenderness.  Lymphadenopathy: He has no cervical adenopathy.  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 Has scab ant shin  Dorsum of foot just before toes has shallow area of skin breakdown, ulceration - min darainge Psychiatric: He has a normal mood and affect. His behavior is normal.   LABS: Results for orders placed or performed during the hospital encounter of 10/27/15 (from the past 48 hour(s))  Basic metabolic panel     Status: Abnormal   Collection Time: 10/27/15 11:23 AM  Result Value Ref Range   Sodium 142 135 - 145 mmol/L   Potassium 4.2 3.5 - 5.1 mmol/L   Chloride 102 101 - 111 mmol/L   CO2 30 22 - 32 mmol/L   Glucose, Bld 141 (H) 65 - 99 mg/dL   BUN 31 (H) 6 - 20 mg/dL   Creatinine, Ser 1.47 (H) 0.61 - 1.24 mg/dL   Calcium 8.7 (L) 8.9 - 10.3 mg/dL   GFR calc non Af Amer 51 (L) >60 mL/min   GFR calc Af Amer 59 (L) >60 mL/min    Comment: (NOTE) The eGFR has been calculated using the CKD EPI equation. This calculation has not been validated in all clinical situations. eGFR's persistently <60 mL/min signify possible Chronic Kidney Disease.    Anion gap 10 5 - 15  CBC     Status: Abnormal   Collection Time: 10/27/15 11:23 AM  Result Value Ref Range   WBC 7.7 3.8 - 10.6 K/uL   RBC 4.19 (L) 4.40 - 5.90 MIL/uL   Hemoglobin 12.6 (L) 13.0 - 18.0 g/dL   HCT 38.4 (L) 40.0 - 52.0 %   MCV 91.7 80.0 - 100.0 fL   MCH 30.2 26.0 - 34.0 pg   MCHC 32.9 32.0 - 36.0 g/dL   RDW 16.2 (H) 11.5 - 14.5 %   Platelets 149 (L) 150 - 440 K/uL  Troponin I     Status: None   Collection Time: 10/27/15 11:23 AM  Result Value Ref Range   Troponin I 0.03 <0.031 ng/mL    Comment:        NO INDICATION OF MYOCARDIAL INJURY.   Brain natriuretic peptide     Status: Abnormal   Collection Time: 10/27/15 11:23 AM  Result Value Ref Range   B Natriuretic Peptide 1686.0 (H) 0.0 - 100.0 pg/mL  Glucose, capillary     Status: Abnormal   Collection Time: 10/27/15 10:17 PM  Result Value Ref Range   Glucose-Capillary 188 (H) 65 - 99 mg/dL  Basic metabolic panel     Status: Abnormal   Collection  Time: 10/28/15  4:44 AM  Result Value Ref Range   Sodium 139 135 - 145 mmol/L   Potassium 3.5 3.5 - 5.1 mmol/L   Chloride 102 101 - 111 mmol/L   CO2 31 22 - 32 mmol/L   Glucose, Bld 187 (  H) 65 - 99 mg/dL   BUN 28 (H) 6 - 20 mg/dL   Creatinine, Ser 1.21 0.61 - 1.24 mg/dL   Calcium 8.5 (L) 8.9 - 10.3 mg/dL   GFR calc non Af Amer >60 >60 mL/min   GFR calc Af Amer >60 >60 mL/min    Comment: (NOTE) The eGFR has been calculated using the CKD EPI equation. This calculation has not been validated in all clinical situations. eGFR's persistently <60 mL/min signify possible Chronic Kidney Disease.    Anion gap 6 5 - 15  CBC     Status: Abnormal   Collection Time: 10/28/15  4:44 AM  Result Value Ref Range   WBC 5.4 3.8 - 10.6 K/uL   RBC 4.06 (L) 4.40 - 5.90 MIL/uL   Hemoglobin 11.9 (L) 13.0 - 18.0 g/dL   HCT 37.4 (L) 40.0 - 52.0 %   MCV 92.1 80.0 - 100.0 fL   MCH 29.3 26.0 - 34.0 pg   MCHC 31.9 (L) 32.0 - 36.0 g/dL   RDW 16.2 (H) 11.5 - 14.5 %   Platelets 140 (L) 150 - 440 K/uL  Glucose, capillary     Status: None   Collection Time: 10/28/15  7:22 AM  Result Value Ref Range   Glucose-Capillary 96 65 - 99 mg/dL   Comment 1 Notify RN   Glucose, capillary     Status: Abnormal   Collection Time: 10/28/15 11:10 AM  Result Value Ref Range   Glucose-Capillary 142 (H) 65 - 99 mg/dL   Comment 1 Notify RN    No components found for: ESR, C REACTIVE PROTEIN MICRO: Recent Results (from the past 720 hour(s))  MRSA PCR Screening     Status: None   Collection Time: 10/14/15  2:05 AM  Result Value Ref Range Status   MRSA by PCR NEGATIVE NEGATIVE Final    Comment:        The GeneXpert MRSA Assay (FDA approved for NASAL specimens only), is one component of a comprehensive MRSA colonization surveillance program. It is not intended to diagnose MRSA infection nor to guide or monitor treatment for MRSA infections.   Wound culture     Status: None   Collection Time: 10/20/15  3:54 PM   Result Value Ref Range Status   Specimen Description LEG RIGHT  Final   Special Requests Normal  Final   Gram Stain RARE WBC SEEN FEW GRAM POSITIVE COCCI IN PAIRS   Final   Culture NORMAL SKIN FLORA  Final   Report Status 10/23/2015 FINAL  Final    IMAGING: Dg Chest 2 View  10/27/2015  CLINICAL DATA:  Shortness of breath, worsening the past 2 days. Chest tightness. 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 Prior CABG. Cardiomegaly. No confluent opacities, effusions or edema. No acute bony abnormality. IMPRESSION: Cardiomegaly.  No active disease. Electronically Signed   By: Rolm Baptise M.D.   On: 10/27/2015 11:47   Dg Chest 2 View  10/13/2015  CLINICAL DATA:  Bilateral lower extremity edema for 2 days. History of congestive heart failure, hypertension and COPD. EXAM: CHEST  2 VIEW COMPARISON:  09/30/2015 radiographs.  CT 08/29/2015. FINDINGS: The left subclavian AICD leads appear unchanged within the right atrium, right ventricle and coronary sinus. There is stable cardiomegaly status post CABG. Chronic vascular congestion is similar to the prior study. There is no overt pulmonary edema, confluent airspace opacity or pleural effusion. Mild thoracic spine degenerative changes are stable.  IMPRESSION: Stable cardiomegaly and chronic vascular congestion. No overt pulmonary edema. Electronically Signed   By: Richardean Sale M.D.   On: 10/13/2015 19:02   US Venous Img Lower Unilateral Right  10/19/2015  CLINICAL DATA:  Cellulitis with worsening pain in the right lower extremity for 2 weeks. EXAM: RIGHT LOWER EXTREMITY VENOUS DOPPLER ULTRASOUND TECHNIQUE: Gray-scale sonography with graded compression, as well as color Doppler and duplex ultrasound were performed to evaluate the lower extremity deep venous systems from the level of the common femoral vein and including the common femoral, femoral, profunda femoral, popliteal and calf veins including the  posterior tibial, peroneal and gastrocnemius veins when visible. The superficial great saphenous vein was also interrogated. Spectral Doppler was utilized to evaluate flow at rest and with distal augmentation maneuvers in the common femoral, femoral and popliteal veins. COMPARISON:  10/13/2015 right lower extremity venous Doppler scan. FINDINGS: Contralateral Common Femoral Vein: Respiratory phasicity is normal and symmetric with the symptomatic side. No evidence of thrombus. Normal compressibility. Common Femoral Vein: No evidence of thrombus. Normal compressibility, respiratory phasicity and response to augmentation. Saphenofemoral Junction: No evidence of thrombus. Normal compressibility and flow on color Doppler imaging. Profunda Femoral Vein: No evidence of thrombus. Normal compressibility and flow on color Doppler imaging. Femoral Vein: No evidence of thrombus. Normal compressibility, respiratory phasicity and response to augmentation. Popliteal Vein: No evidence of thrombus. Normal compressibility, respiratory phasicity and response to augmentation. Calf Veins: No evidence of thrombus. Normal compressibility and flow on color Doppler imaging. Superficial Great Saphenous Vein: No evidence of thrombus. Normal compressibility and flow on color Doppler imaging. Venous Reflux:  None. Other Findings: Prominent superficial edema throughout the right lower extremity. IMPRESSION: No evidence of deep venous thrombosis in the right lower extremity. Electronically Signed   By: Ilona Sorrel M.D.   On: 10/19/2015 17:01   [place] Extrem Low Right Comp  10/14/2015  CLINICAL DATA:  60 year old male with right leg pain and swelling EXAM: Right LOWER EXTREMITY VENOUS DOPPLER ULTRASOUND TECHNIQUE: Gray-scale sonography with graded compression, as well as color Doppler and duplex ultrasound were performed to evaluate the lower extremity deep venous systems from the level of the common femoral vein and including the common femoral,  femoral, profunda femoral, popliteal and calf veins including the posterior tibial, peroneal and gastrocnemius veins when visible. The superficial great saphenous vein was also interrogated. Spectral Doppler was utilized to evaluate flow at rest and with distal augmentation maneuvers in the common femoral, femoral and popliteal veins. COMPARISON:  Ultrasound dated 02/08/2005 FINDINGS: Contralateral Common Femoral Vein: Respiratory phasicity is normal and symmetric with the symptomatic side. No evidence of thrombus. Normal compressibility. Common Femoral Vein: No evidence of thrombus. Normal compressibility, respiratory phasicity and response to augmentation. Saphenofemoral Junction: No evidence of thrombus. Normal compressibility and flow on color Doppler imaging. Profunda Femoral Vein: No evidence of thrombus. Normal compressibility and flow on color Doppler imaging. Femoral Vein: No evidence of thrombus. Normal compressibility, respiratory phasicity and response to augmentation. Popliteal Vein: No evidence of thrombus. Normal compressibility, respiratory phasicity and response to augmentation. Calf Veins: No evidence of thrombus. Normal compressibility and flow on color Doppler imaging. Superficial Great Saphenous Vein: No evidence of thrombus. Normal compressibility and flow on color Doppler imaging. IMPRESSION: No evidence of deep venous thrombosis in the right lower extremity. Electronically Signed   By: Anner Crete M.D.   On: 10/14/2015 01:00   Dg Chest Portable 1 View  09/30/2015  CLINICAL DATA:  Shortness of breath with chest  pain and cough EXAM: PORTABLE CHEST 1 VIEW COMPARISON:  Chest radiograph April 27, 2015; chest CT April 28, 2015 FINDINGS: There is no edema or consolidation. There is cardiomegaly with pulmonary vascularity within normal limits. Defibrillator leads are attached to the right atrium, right ventricle, and left ventricle. No pneumothorax. Lymph node prominence seen on recent CT is  not appreciable radiographically. IMPRESSION: Cardiomegaly.  No edema or consolidation. Electronically Signed   By: Lowella Grip III M.D.   On: 09/30/2015 11:52    Assessment:   Ronald Miller. is a 60 y.o. male with CHF EF 20-25% readmitted with vol overload.  At last admission he had RLE cellulitis and a shallow ulcer on dorsum of foot.  He responded well to abx and unnaboot.  His cellulitis is much improved and his wound is improving. No fevers, nml WBC.  Wound cx last admit with Nml flora  Recommendations Continue diuresis. Elevate legs on 6 pillows.   Change ancef to oral keflex for 3 more days If redness recurs on RLE would replace unawrap Thank you very much for allowing me to participate in the care of this patient. Please call with questions.   Cheral Marker. Ola Spurr, MD

## 2015-10-28 NOTE — Consult Note (Signed)
[facility] Consultation Note  Patient ID: Windel Tenold., MRN: [id number], DOB/AGE: 1956-01-14 60 y.o. Admit date: 10/27/2015   Date of Consult: 10/28/2015 Primary Physician: Pcp Not In System Primary Cardiologist: [facility]  Chief Complaint:  Chief Complaint  Patient presents with  . Shortness of Breath   Reason for Consult: acute on chronic systolic dysfunction congestive heart failure  HPI: 60 y.o. male with known chronic systolic dysfunction congestive heart failure coronary atherosclerosis with some mild angina diabetes with complication and hypertension status post previous pacemaker placement who has had multiple bouts of hospitalizations due to significant acute on chronic systolic dysfunction heart failure some of which were due to poor compliance of medications and others due to dietary indiscretion. It is not clear as to why the patient has had worsening lower extremity edema and abdominal L as shortness of breath and pulmonary edema at this time. The patient has had slight improvement with intravenous Lasix with normal creatinine and no evidence of significant anemia. The patient has had no evidence of myocardial infarction on this admission. He does have some abdominal discomfort from a previous cholecystectomy which appears to be slowly improving.  Past Medical History  Diagnosis Date  . ASCVD (arteriosclerotic cardiovascular disease)   . Diabetes mellitus   . Hypertension   . History of cocaine abuse   . Tobacco abuse   . Depression   . Suicidal ideation   . Chronic systolic CHF (congestive heart failure) ([place])   . COPD (chronic obstructive pulmonary disease) ([place])   . Coronary artery disease       Surgical History:  Past Surgical History  Procedure Laterality Date  . Coronary artery bypass graft  2001  . Lumbar disc surgery  02/1999    Discectomy and fusion  . Vasectomy      Subsequent reversal  . Insert / replace / remove pacemaker    .  Cardiac defibrillator placement    . Cholecystectomy N/A 08/30/2015    Procedure: LAPAROSCOPIC CHOLECYSTECTOMY;  Surgeon: Hubbard Robinson, MD;  Location: [name];  Service: General;  Laterality: N/A;  . Cardiac defibrillator placement       Home Meds: Prior to Admission medications   Medication Sig Start Date End Date Taking? Authorizing Provider  acetaminophen (TYLENOL) 500 MG tablet Take 1,000 mg by mouth every 6 (six) hours as needed for mild pain or headache.   Yes Historical Provider, MD  albuterol (PROVENTIL HFA;VENTOLIN HFA) 108 (90 BASE) MCG/ACT inhaler Inhale 2 puffs into the lungs every 6 (six) hours as needed for wheezing or shortness of breath.   Yes Historical Provider, MD  aspirin EC 81 MG tablet Take 81 mg by mouth daily.   Yes Historical Provider, MD  atorvastatin (LIPITOR) 20 MG tablet Take 20 mg by mouth every evening.   Yes Historical Provider, MD  baclofen (LIORESAL) 10 MG tablet Take 10 mg by mouth 3 (three) times daily as needed for muscle spasms.    Yes Historical Provider, MD  benazepril (LOTENSIN) 5 MG tablet Take 1 tablet (5 mg total) by mouth daily. 09/02/15  Yes Vipul Manuella Ghazi, MD  cephALEXin (KEFLEX) 500 MG capsule Take 1 capsule (500 mg total) by mouth 3 (three) times daily. 10/21/15  Yes Srikar Sudini, MD  escitalopram (LEXAPRO) 20 MG tablet Take 1 tablet (20 mg total) by mouth daily. 09/02/15  Yes Max Sane, MD  furosemide (LASIX) 40 MG tablet Take 40-80 mg by mouth 2 (two) times daily. Pt takes two  tablets in the morning and one in the evening.   Yes Historical Provider, MD  insulin aspart (NOVOLOG) 100 UNIT/ML injection Inject 10 Units into the skin 3 (three) times daily before meals. 09/02/15  Yes Vipul Manuella Ghazi, MD  insulin glargine (LANTUS) 100 UNIT/ML injection Inject 30 Units into the skin at bedtime.   Yes Historical Provider, MD  metFORMIN (GLUCOPHAGE) 1000 MG tablet Take 1 tablet (1,000 mg total) by mouth 2 (two) times daily. 09/02/15  Yes Vipul Manuella Ghazi, MD   metolazone (ZAROXOLYN) 5 MG tablet Take 5 mg by mouth daily as needed (for 4-5 pound weight gain).   Yes Historical Provider, MD  metoprolol succinate (TOPROL-XL) 100 MG 24 hr tablet Take 100 mg by mouth every evening.   Yes Historical Provider, MD  potassium chloride SA (K-DUR,KLOR-CON) 20 MEQ tablet Take 1 tablet (20 mEq total) by mouth 2 (two) times daily. Patient taking differently: Take 20 mEq by mouth daily.  10/21/15  Yes Srikar Sudini, MD  oxyCODONE-acetaminophen (PERCOCET) 7.5-325 MG tablet Take 1 tablet by mouth every 8 (eight) hours as needed for severe pain. Patient not taking: Reported on 10/27/2015 10/21/15   Hillary Bow, MD  predniSONE (DELTASONE) 50 MG tablet Take 1 tablet (50 mg total) by mouth daily with breakfast. Patient not taking: Reported on 10/13/2015 09/30/15   Lavonia Drafts, MD  zolpidem (AMBIEN CR) 12.5 MG CR tablet Take 1 tablet (12.5 mg total) by mouth at bedtime as needed for sleep. Patient not taking: Reported on 10/27/2015 10/21/15   Hillary Bow, MD    Inpatient Medications:  . aspirin EC  81 mg Oral Daily  . atorvastatin  20 mg Oral QPM  . cephALEXin  500 mg Oral 3 times per day  . enoxaparin (LOVENOX) injection  40 mg Subcutaneous Q24H  . escitalopram  20 mg Oral Daily  . insulin aspart  0-15 Units Subcutaneous TID WC  . insulin glargine  30 Units Subcutaneous QHS  . potassium chloride  20 mEq Oral Daily      Allergies:  Allergies  Allergen Reactions  . Codeine Nausea And Vomiting    Social History   Social History  . Marital Status: Single    Spouse Name: N/A  . Number of Children: N/A  . Years of Education: N/A   Occupational History  . Retired   . Truck driver    Social History Main Topics  . Smoking status: Former Smoker -- 1.50 packs/day for 23 years    Types: Cigarettes    Quit date: 08/27/2015  . Smokeless tobacco: Never Used  . Alcohol Use: No  . Drug Use: No     Comment: Former  . Sexual Activity: Not on file   Other Topics  Concern  . Not on file   Social History Narrative   Divorced with one child   No regular exercise     Family History  Problem Relation Age of Onset  . CAD Father   . Diabetes Father   . Heart failure Father      Review of Systems Positive for abdominal discomfort shortness of breath Negative for: General:  chills, fever, night sweats or weight changes.  Cardiovascular: PND orthopnea syncope dizziness  Dermatological skin lesions rashes Respiratory: Cough congestion Urologic: Frequent urination urination at night and hematuria Abdominal: negative for nausea, vomiting, diarrhea, bright red blood per rectum, melena, or hematemesis Neurologic: negative for visual changes, and/or hearing changes  All other systems reviewed and are otherwise negative except as noted above.  Labs:  Recent Labs  10/27/15 1123  TROPONINI 0.03   Lab Results  Component Value Date   WBC 5.4 10/28/2015   HGB 11.9* 10/28/2015   HCT 37.4* 10/28/2015   MCV 92.1 10/28/2015   PLT 140* 10/28/2015    Recent Labs Lab 10/28/15 0444  NA 139  K 3.5  CL 102  CO2 31  BUN 28*  CREATININE 1.21  CALCIUM 8.5*  GLUCOSE 187*   Lab Results  Component Value Date   CHOL 153 08/15/2013   HDL 37* 08/15/2013   LDLCALC 89 08/15/2013   TRIG 133 08/15/2013   No results found for: DDIMER  Radiology/Studies:  Dg Chest 2 View  10/27/2015  CLINICAL DATA:  Shortness of breath, worsening the past 2 days. Chest tightness. 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 Prior CABG. Cardiomegaly. No confluent opacities, effusions or edema. No acute bony abnormality. IMPRESSION: Cardiomegaly.  No active disease. Electronically Signed   By: Rolm Baptise M.D.   On: 10/27/2015 11:47   Dg Chest 2 View  10/13/2015  CLINICAL DATA:  Bilateral lower extremity edema for 2 days. History of congestive heart failure, hypertension and COPD. EXAM: CHEST  2 VIEW COMPARISON:  09/30/2015  radiographs.  CT 08/29/2015. FINDINGS: The left subclavian AICD leads appear unchanged within the right atrium, right ventricle and coronary sinus. There is stable cardiomegaly status post CABG. Chronic vascular congestion is similar to the prior study. There is no overt pulmonary edema, confluent airspace opacity or pleural effusion. Mild thoracic spine degenerative changes are stable. IMPRESSION: Stable cardiomegaly and chronic vascular congestion. No overt pulmonary edema. Electronically Signed   By: Richardean Sale M.D.   On: 10/13/2015 19:02   US Venous Img Lower Unilateral Right  10/19/2015  CLINICAL DATA:  Cellulitis with worsening pain in the right lower extremity for 2 weeks. EXAM: RIGHT LOWER EXTREMITY VENOUS DOPPLER ULTRASOUND TECHNIQUE: Gray-scale sonography with graded compression, as well as color Doppler and duplex ultrasound were performed to evaluate the lower extremity deep venous systems from the level of the common femoral vein and including the common femoral, femoral, profunda femoral, popliteal and calf veins including the posterior tibial, peroneal and gastrocnemius veins when visible. The superficial great saphenous vein was also interrogated. Spectral Doppler was utilized to evaluate flow at rest and with distal augmentation maneuvers in the common femoral, femoral and popliteal veins. COMPARISON:  10/13/2015 right lower extremity venous Doppler scan. FINDINGS: Contralateral Common Femoral Vein: Respiratory phasicity is normal and symmetric with the symptomatic side. No evidence of thrombus. Normal compressibility. Common Femoral Vein: No evidence of thrombus. Normal compressibility, respiratory phasicity and response to augmentation. Saphenofemoral Junction: No evidence of thrombus. Normal compressibility and flow on color Doppler imaging. Profunda Femoral Vein: No evidence of thrombus. Normal compressibility and flow on color Doppler imaging. Femoral Vein: No evidence of thrombus.  Normal compressibility, respiratory phasicity and response to augmentation. Popliteal Vein: No evidence of thrombus. Normal compressibility, respiratory phasicity and response to augmentation. Calf Veins: No evidence of thrombus. Normal compressibility and flow on color Doppler imaging. Superficial Great Saphenous Vein: No evidence of thrombus. Normal compressibility and flow on color Doppler imaging. Venous Reflux:  None. Other Findings: Prominent superficial edema throughout the right lower extremity. IMPRESSION: No evidence of deep venous thrombosis in the right lower extremity. Electronically Signed   By: Ilona Sorrel M.D.   On: 10/19/2015 17:01   [place] Extrem Low Right Comp  10/14/2015  CLINICAL DATA:  60 year old male with right leg pain and swelling EXAM: Right LOWER EXTREMITY VENOUS DOPPLER ULTRASOUND TECHNIQUE: Gray-scale sonography with graded compression, as well as color Doppler and duplex ultrasound were performed to evaluate the lower extremity deep venous systems from the level of the common femoral vein and including the common femoral, femoral, profunda femoral, popliteal and calf veins including the posterior tibial, peroneal and gastrocnemius veins when visible. The superficial great saphenous vein was also interrogated. Spectral Doppler was utilized to evaluate flow at rest and with distal augmentation maneuvers in the common femoral, femoral and popliteal veins. COMPARISON:  Ultrasound dated 02/08/2005 FINDINGS: Contralateral Common Femoral Vein: Respiratory phasicity is normal and symmetric with the symptomatic side. No evidence of thrombus. Normal compressibility. Common Femoral Vein: No evidence of thrombus. Normal compressibility, respiratory phasicity and response to augmentation. Saphenofemoral Junction: No evidence of thrombus. Normal compressibility and flow on color Doppler imaging. Profunda Femoral Vein: No evidence of thrombus. Normal compressibility and flow on color Doppler imaging.  Femoral Vein: No evidence of thrombus. Normal compressibility, respiratory phasicity and response to augmentation. Popliteal Vein: No evidence of thrombus. Normal compressibility, respiratory phasicity and response to augmentation. Calf Veins: No evidence of thrombus. Normal compressibility and flow on color Doppler imaging. Superficial Great Saphenous Vein: No evidence of thrombus. Normal compressibility and flow on color Doppler imaging. IMPRESSION: No evidence of deep venous thrombosis in the right lower extremity. Electronically Signed   By: Anner Crete M.D.   On: 10/14/2015 01:00   Dg Chest Portable 1 View  09/30/2015  CLINICAL DATA:  Shortness of breath with chest pain and cough EXAM: PORTABLE CHEST 1 VIEW COMPARISON:  Chest radiograph April 27, 2015; chest CT April 28, 2015 FINDINGS: There is no edema or consolidation. There is cardiomegaly with pulmonary vascularity within normal limits. Defibrillator leads are attached to the right atrium, right ventricle, and left ventricle. No pneumothorax. Lymph node prominence seen on recent CT is not appreciable radiographically. IMPRESSION: Cardiomegaly.  No edema or consolidation. Electronically Signed   By: Lowella Grip III M.D.   On: 09/30/2015 11:52    EKG: Paced rhythm  Weights: Filed Weights   10/27/15 1049 10/27/15 2149 10/28/15 0502  Weight: 263 lb (119.296 kg) 261 lb 4.8 oz (118.525 kg) 261 lb 4.8 oz (118.525 kg)     Physical Exam: Blood pressure 97/72, pulse 64, temperature 97.9 F (36.6 C), temperature source Oral, resp. rate 18, height 5\' 9"  (1.753 m), weight 261 lb 4.8 oz (118.525 kg), SpO2 97 %. Body mass index is 38.57 kg/(m^2). General: Well developed, well nourished, in no acute distress. Head eyes ears nose throat: Normocephalic, atraumatic, sclera non-icteric, no xanthomas, nares are without discharge. No apparent thyromegaly and/or mass  Lungs: Normal respiratory effort.  no wheezes, basilar rales, no rhonchi.  Heart:  RRR with normal S1 S2. no murmur gallop, no rub, PMI is normal size and placement, carotid upstroke normal without bruit, jugular venous pressure is normal Abdomen: Soft, non-tender, with with normoactive bowel sounds. No hepatomegaly. No rebound/guarding. No obvious abdominal masses. Abdominal aorta is normal size without bruit Extremities: 2+ edema. no cyanosis, no clubbing, right lower ulcers  Peripheral : 2+ bilateral upper extremity pulses, 2+ bilateral femoral pulses, 0 + bilateral dorsal pedal pulse Neuro: Alert and oriented. No facial asymmetry. No focal deficit. Moves all extremities spontaneously. Musculoskeletal: Normal muscle tone without kyphosis Psych:  Responds to questions appropriately with a normal affect.    Assessment: 60 year old male with acute on chronic systolic dysfunction congestive heart  failure coronary atherosclerosis without evidence of myocardial infarction diabetes with high complication and essential hypertension on previously appropriate medication management needing continuation at this time  Plan: 1. No further cardiac workup or diagnostics due to known cardiovascular disease and LV systolic dysfunction 2. Continue intravenous Lasix until patient has improvements of lower extremity edema and improvements of cellulitis from antibiotics and abdominal bloating 3. Continue high intensity cholesterol therapy with atorvastatin 4. Beta blocker including carvedilol if able for LV systolic dysfunction 5. Begin ambulation and further dietary consultation for improvements of long-term treatment and consultation with congestive heart failure clinic  Signed, Corey Skains M.D. [facility] 10/28/2015, 5:36 PM

## 2015-10-29 ENCOUNTER — Encounter: Payer: Self-pay | Admitting: Radiology

## 2015-10-29 ENCOUNTER — Inpatient Hospital Stay: Payer: Medicare HMO

## 2015-10-29 DIAGNOSIS — I2510 Atherosclerotic heart disease of native coronary artery without angina pectoris: Secondary | ICD-10-CM

## 2015-10-29 DIAGNOSIS — I5022 Chronic systolic (congestive) heart failure: Secondary | ICD-10-CM

## 2015-10-29 DIAGNOSIS — Z915 Personal history of self-harm: Secondary | ICD-10-CM

## 2015-10-29 DIAGNOSIS — R0602 Shortness of breath: Secondary | ICD-10-CM

## 2015-10-29 DIAGNOSIS — R591 Generalized enlarged lymph nodes: Secondary | ICD-10-CM

## 2015-10-29 DIAGNOSIS — Z87891 Personal history of nicotine dependence: Secondary | ICD-10-CM

## 2015-10-29 DIAGNOSIS — F17210 Nicotine dependence, cigarettes, uncomplicated: Secondary | ICD-10-CM

## 2015-10-29 DIAGNOSIS — E119 Type 2 diabetes mellitus without complications: Secondary | ICD-10-CM

## 2015-10-29 DIAGNOSIS — J449 Chronic obstructive pulmonary disease, unspecified: Secondary | ICD-10-CM

## 2015-10-29 DIAGNOSIS — R918 Other nonspecific abnormal finding of lung field: Secondary | ICD-10-CM

## 2015-10-29 DIAGNOSIS — I10 Essential (primary) hypertension: Secondary | ICD-10-CM

## 2015-10-29 DIAGNOSIS — L03119 Cellulitis of unspecified part of limb: Secondary | ICD-10-CM

## 2015-10-29 DIAGNOSIS — F329 Major depressive disorder, single episode, unspecified: Secondary | ICD-10-CM

## 2015-10-29 DIAGNOSIS — I5030 Unspecified diastolic (congestive) heart failure: Secondary | ICD-10-CM

## 2015-10-29 DIAGNOSIS — R944 Abnormal results of kidney function studies: Secondary | ICD-10-CM

## 2015-10-29 DIAGNOSIS — Z87898 Personal history of other specified conditions: Secondary | ICD-10-CM

## 2015-10-29 LAB — GLUCOSE, CAPILLARY
GLUCOSE-CAPILLARY: 143 mg/dL — AB (ref 65–99)
Glucose-Capillary: 62 mg/dL — ABNORMAL LOW (ref 65–99)
Glucose-Capillary: 230 mg/dL — ABNORMAL HIGH (ref 65–99)
Glucose-Capillary: 212 mg/dL — ABNORMAL HIGH (ref 65–99)
Glucose-Capillary: 145 mg/dL — ABNORMAL HIGH (ref 65–99)

## 2015-10-29 LAB — BASIC METABOLIC PANEL
Anion gap: 6 (ref 5–15)
BUN: 33 mg/dL — AB (ref 6–20)
CHLORIDE: 98 mmol/L — AB (ref 101–111)
CO2: 29 mmol/L (ref 22–32)
CREATININE: 1.33 mg/dL — AB (ref 0.61–1.24)
Calcium: 8 mg/dL — ABNORMAL LOW (ref 8.9–10.3)
GFR calc Af Amer: >60 mL/min (ref >60)
GFR calc non Af Amer: 57 mL/min — ABNORMAL LOW (ref >60)
Glucose, Bld: 78 mg/dL (ref 65–99)
Potassium: 4.2 mmol/L (ref 3.5–5.1)
Sodium: 133 mmol/L — ABNORMAL LOW (ref 135–145)

## 2015-10-29 MED ORDER — FUROSEMIDE 10 MG/ML IJ SOLN
40.0000 mg | Freq: Two times a day (BID) | INTRAMUSCULAR | Status: DC
  Administered 2015-10-29 – 2015-10-30 (×2): 40 mg via INTRAVENOUS
  Filled 2015-10-29 (×2): qty 4

## 2015-10-29 NOTE — Care Management Important Message (Signed)
Important Message  Patient Details  Name: Ronald Miller. MRN: [id number] Date of Birth: October 29, 1955   Medicare Important Message Given:  Yes    Juliann Pulse A Latoyia Tecson 10/29/2015, 2:54 PM

## 2015-10-29 NOTE — Progress Notes (Signed)
Patient ID: Ronald Trochez., male   DOB: [birth year], 60 y.o.   MRN: [id number] [facility] PROGRESS NOTE  Ronald Miller. [id number] DOB: 1956-03-22 DOA: 10/27/2015 PCP: Pcp Not In System  HPI/Subjective: Patient still feels short of breath.  States his leg swelling is a little better.  Weight gain recently.   Objective: Filed Vitals:   10/29/15 0625 10/29/15 0800  BP: 87/57 113/90  Pulse:  70  Temp:  97.4 F (36.3 C)  Resp:  17    Filed Weights   10/27/15 2149 10/28/15 0502 10/29/15 0455  Weight: 118.525 kg (261 lb 4.8 oz) 118.525 kg (261 lb 4.8 oz) 120.702 kg (266 lb 1.6 oz)    ROS: Review of Systems  Constitutional: Negative for fever and chills.  Eyes: Negative for blurred vision.  Respiratory: Positive for shortness of breath. Negative for cough.   Cardiovascular: Negative for chest pain.  Gastrointestinal: Positive for nausea and abdominal pain. Negative for vomiting, diarrhea and constipation.  Genitourinary: Negative for dysuria.  Musculoskeletal: Negative for joint pain.  Neurological: Negative for dizziness and headaches.   Exam: Physical Exam  Constitutional: He is oriented to person, place, and time.  HENT:  Nose: No mucosal edema.  Mouth/Throat: No oropharyngeal exudate or posterior oropharyngeal edema.  Eyes: Conjunctivae, EOM and lids are normal. Pupils are equal, round, and reactive to light.  Neck: No JVD present. Carotid bruit is not present. No edema present. No thyroid mass and no thyromegaly present.  Cardiovascular: S1 normal and S2 normal.  Exam reveals no gallop.   No murmur heard. Pulses:      Dorsalis pedis pulses are 0 on the right side, and 0 on the left side.  Respiratory: No respiratory distress. He has decreased breath sounds in the right lower field and the left lower field. He has no wheezes. He has no rhonchi. He has no rales.  GI: Soft. Bowel sounds are normal. There is no tenderness.  Musculoskeletal:        Right ankle: He exhibits swelling.       Left ankle: He exhibits swelling.  Lymphadenopathy:    He has no cervical adenopathy.  Neurological: He is alert and oriented to person, place, and time. No cranial nerve deficit.  Skin: Skin is warm. No rash noted. Nails show no clubbing.  Psychiatric: He has a normal mood and affect.      Data Reviewed: Basic Metabolic Panel:  Recent Labs Lab 10/27/15 1123 10/28/15 0444 10/29/15 0448  NA 142 139 133*  K 4.2 3.5 4.2  CL 102 102 98*  CO2 30 31 29   GLUCOSE 141* 187* 78  BUN 31* 28* 33*  CREATININE 1.47* 1.21 1.33*  CALCIUM 8.7* 8.5* 8.0*   CBC:  Recent Labs Lab 10/27/15 1123 10/28/15 0444  WBC 7.7 5.4  HGB 12.6* 11.9*  HCT 38.4* 37.4*  MCV 91.7 92.1  PLT 149* 140*    BNP (last 3 results)  Recent Labs  09/30/15 1056 10/13/15 1942 10/27/15 1123  BNP 2375.0* 2776.0* 1686.0*    CBG:  Recent Labs Lab 10/27/15 2217 10/28/15 0722 10/28/15 1110 10/28/15 1611 10/28/15 2020  GLUCAP 188* 96 142* 147* 168*    Recent Results (from the past 240 hour(s))  Wound culture     Status: None   Collection Time: 10/20/15  3:54 PM  Result Value Ref Range Status   Specimen Description LEG RIGHT  Final   Special Requests Normal  Final   Gram Stain  RARE WBC SEEN FEW GRAM POSITIVE COCCI IN PAIRS   Final   Culture NORMAL SKIN FLORA  Final   Report Status 10/23/2015 FINAL  Final     Studies: Dg Chest 2 View  10/27/2015  CLINICAL DATA:  Shortness of breath, worsening the past 2 days. Chest tightness. 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 Prior CABG. Cardiomegaly. No confluent opacities, effusions or edema. No acute bony abnormality. IMPRESSION: Cardiomegaly.  No active disease. Electronically Signed   By: Rolm Baptise M.D.   On: 10/27/2015 11:47    Scheduled Meds: . aspirin EC  81 mg Oral Daily  . atorvastatin  20 mg Oral QPM  . cephALEXin  500 mg Oral 3 times per day   . escitalopram  20 mg Oral Daily  . insulin aspart  0-15 Units Subcutaneous TID WC  . insulin glargine  30 Units Subcutaneous QHS  . potassium chloride  20 mEq Oral Daily    Assessment/Plan:  1. Relative hypotension- holding benazepril, Lasix, Toprol, metolozone. Restart once blood pressure higher. Hold off giving a fluid bolus at this point since he does have history of CHF. Can consider dobutamine and dopamine drip if needed, but since lungs are clear- we can probably hold off on this. 2. Acute on chronic systolic congestive heart failure with cardiomyopathy and lower extremity anasarca. Patient was given IV Lasix on presentation and blood pressure is low so I will have to hold medication at this point. 3. Cellulitis of the right lower extremity with ulcer on the foot.  Keflex. 4. Anxiety depression on Lexapro. 5. Diabetes type 2 on glargine insulin and sliding scale. 6. Acute renal insufficiency- monitor closely with diuresis 7. Hypokalemia replaced 8. Hyperlipidemia unspecified on atorvastatin 9. Abdominal distention- get an ultrasound of the right upper quadrant and check for ascites.  10. Lymphadenopathy in the chest on previous ct chest- unclear etiology- oncology consult   Code Status:     Code Status Orders        Start     Ordered   10/27/15 1915  Full code   Continuous     10/27/15 1917    Code Status History    Date Active Date Inactive Code Status Order ID Comments User Context   10/14/2015  1:39 AM 10/21/2015  8:57 PM Full Code [id number]  Saundra Shelling, MD Inpatient   08/28/2015  2:40 AM 09/01/2015  8:01 PM Full Code [id number]  Lance Coon, MD Inpatient     Family Communication: Mother yesterday Disposition Plan: TBD  Antibiotics:  Keflex  Time spent: 64minutes  Hermione Havlicek, [facility]

## 2015-10-29 NOTE — Care Management Note (Signed)
Case Management Note  Patient Details  Name: Ronald Miller. MRN: [id number] Date of Birth: 1956/03/12  Subjective/Objective: Recently at [facility] 01/09-01/17 with CHF exacerbation. Readmitted 01/23 with CHF exacerbation again. Patient was discharged home with self care, cellulitis and una boots. Per Dr. Ola Spurr, cellulitis is much improved with una boots and antibiotics.  He has an appointment at the heart failure clinic Nov 06, 2015.  Hypotensive, shortness of breath with minimal exertion, RA. Receiving IV lasix. Cardiology, Infectious disease and oncology consults for CHF, cellulitis and lymphadenopathy.  Did not qualify for home health at last admission due to not being home bound.                 Action/Plan: Will follow progression  Expected Discharge Date:                  Expected Discharge Plan:     In-House Referral:     Discharge planning Services  CM Consult  Post Acute Care Choice:    Choice offered to:     DME Arranged:    DME Agency:     HH Arranged:    HH Agency:     Status of Service:  In process, will continue to follow  Medicare Important Message Given:    Date Medicare IM Given:    Medicare IM give by:    Date Additional Medicare IM Given:    Additional Medicare Important Message give by:     If discussed at [place] of Stay Meetings, dates discussed:    Additional Comments:  Jolly Mango, RN 10/29/2015, 2:36 PM

## 2015-10-29 NOTE — Progress Notes (Signed)
To ultrasound via bed 

## 2015-10-29 NOTE — Plan of Care (Signed)
Problem: Pain Managment: Goal: General experience of comfort will improve Outcome: Progressing Prn medications  Problem: Skin Integrity: Goal: Risk for impaired skin integrity will decrease Outcome: Not Progressing Wound to rt foot

## 2015-10-29 NOTE — Progress Notes (Signed)
Back from ultrasound

## 2015-10-29 NOTE — Consult Note (Signed)
[facility] CONSULT NOTE  Patient Care Team: Pcp Not In System as PCP - General  CHIEF COMPLAINTS/PURPOSE OF CONSULTATION: mediastinal lymphadenopathy-concerning for malignancy  HISTORY OF PRESENTING ILLNESS:  Ronald Miller. 60 y.o.  male with a history of medical problems including active smoking/COPD and diastolic CHF is currently admitted to the hospital for episode of congestive heart failure. Patient also noted to have cellulitis of his lower extremity-on antibiotics.   Overall patient seems to be improving in terms of his symptoms; however he continues to have shortness of breath especially exertion/laying on the back.  Patient had a CAT scan exactly 2 months ago/November 2016 during a previous hospitalization for similar complaints- that showed mediastinal lymph nodes pretracheal subcarinal and hilar lymph nodes measuring 15-25 mm. Also noted to have a 17 mm celiac axis lymph node. No other lesions noted in the lungs on the liver concerning for malignancy. We have been consult with regards to lymphadenopathy.   Patient's kidney function is slightly abnormal- today creatinine is 1.33/baseline is around 1.  Patient denies any symptoms of profuse night sweats or ongoing fevers. His weight changes are unreliable because of history of CHF.  ROS: A complete 10 point review of system is done which is negative except mentioned above in history of present illness  MEDICAL HISTORY:  Past Medical History  Diagnosis Date  . ASCVD (arteriosclerotic cardiovascular disease)   . Diabetes mellitus   . Hypertension   . History of cocaine abuse   . Tobacco abuse   . Depression   . Suicidal ideation   . Chronic systolic CHF (congestive heart failure) ([place])   . COPD (chronic obstructive pulmonary disease) ([place])   . Coronary artery disease     SURGICAL HISTORY: Past Surgical History  Procedure Laterality Date  . Coronary artery bypass graft  2001  . Lumbar disc surgery   02/1999    Discectomy and fusion  . Vasectomy      Subsequent reversal  . Insert / replace / remove pacemaker    . Cardiac defibrillator placement    . Cholecystectomy N/A 08/30/2015    Procedure: LAPAROSCOPIC CHOLECYSTECTOMY;  Surgeon: Hubbard Robinson, MD;  Location: [name];  Service: General;  Laterality: N/A;  . Cardiac defibrillator placement      SOCIAL HISTORY: Social History   Social History  . Marital Status: Single    Spouse Name: N/A  . Number of Children: N/A  . Years of Education: N/A   Occupational History  . Retired   . Truck driver    Social History Main Topics  . Smoking status: Former Smoker -- 1.50 packs/day for 23 years    Types: Cigarettes    Quit date: 08/27/2015  . Smokeless tobacco: Never Used  . Alcohol Use: No  . Drug Use: No     Comment: Former  . Sexual Activity: Not on file   Other Topics Concern  . Not on file   Social History Narrative   Divorced with one child   No regular exercise    FAMILY HISTORY: Family History  Problem Relation Age of Onset  . CAD Father   . Diabetes Father   . Heart failure Father     ALLERGIES:  is allergic to codeine.  MEDICATIONS:  Current Facility-Administered Medications  Medication Dose Route Frequency Provider Last Rate Last Dose  . acetaminophen (TYLENOL) suppository 650 mg  650 mg Rectal Q6H PRN Epifanio Lesches, MD      . acetaminophen (  TYLENOL) tablet 1,000 mg  1,000 mg Oral Q6H PRN Epifanio Lesches, MD      . albuterol (PROVENTIL) (2.5 MG/3ML) 0.083% nebulizer solution 2.5 mg  2.5 mg Nebulization Q6H PRN Epifanio Lesches, MD      . aspirin EC tablet 81 mg  81 mg Oral Daily Epifanio Lesches, MD   81 mg at 10/29/15 0930  . atorvastatin (LIPITOR) tablet 20 mg  20 mg Oral QPM Epifanio Lesches, MD   20 mg at 10/28/15 2216  . baclofen (LIORESAL) tablet 10 mg  10 mg Oral TID PRN Epifanio Lesches, MD      . cephALEXin (KEFLEX) capsule 500 mg  500 mg Oral 3 times per day Adrian Prows, MD   500 mg at 10/29/15 0620  . escitalopram (LEXAPRO) tablet 20 mg  20 mg Oral Daily Epifanio Lesches, MD   20 mg at 10/29/15 0930  . insulin aspart (novoLOG) injection 0-15 Units  0-15 Units Subcutaneous TID WC Epifanio Lesches, MD   5 Units at 10/29/15 1217  . insulin glargine (LANTUS) injection 30 Units  30 Units Subcutaneous QHS Epifanio Lesches, MD   30 Units at 10/28/15 2031  . ondansetron (ZOFRAN) tablet 4 mg  4 mg Oral Q6H PRN Epifanio Lesches, MD   4 mg at 10/28/15 0334   Or  . ondansetron (ZOFRAN) injection 4 mg  4 mg Intravenous Q6H PRN Epifanio Lesches, MD   4 mg at 10/28/15 2005  . oxyCODONE-acetaminophen (PERCOCET) 7.5-325 MG per tablet 1 tablet  1 tablet Oral Q8H PRN Loletha Grayer, MD   1 tablet at 10/28/15 2217  . potassium chloride SA (K-DUR,KLOR-CON) CR tablet 20 mEq  20 mEq Oral Daily Loletha Grayer, MD   20 mEq at 10/29/15 0930      .  PHYSICAL EXAMINATION:   Filed Vitals:   10/29/15 0625 10/29/15 0800  BP: 87/57 113/90  Pulse:  70  Temp:  97.4 F (36.3 C)  Resp:  17   Filed Weights   10/27/15 2149 10/28/15 0502 10/29/15 0455  Weight: 261 lb 4.8 oz (118.525 kg) 261 lb 4.8 oz (118.525 kg) 266 lb 1.6 oz (120.702 kg)    GENERAL: Well-nourished well-developed; Alert, no distress and comfortable.  Alone. EYES: no pallor or icterus OROPHARYNX: no thrush or ulceration;  NECK: supple, no masses felt LYMPH:  no palpable lymphadenopathy in the cervical, axillary or inguinal regions LUNGS:Decreased breath sounds to auscultation bilaterally at bases; and  No wheeze or crackles HEART/CVS: regular rate & rhythm and no murmurs; No lower extremity edema ABDOMEN: abdomen soft, non-tender and normal bowel sounds Musculoskeletal:no cyanosis of digits and no clubbing  PSYCH: alert & oriented x 3 with fluent speech NEURO: no focal motor/sensory deficits SKIN:  no rashes or significant lesions  LABORATORY DATA:  I have reviewed the data as  listed Lab Results  Component Value Date   WBC 5.4 10/28/2015   HGB 11.9* 10/28/2015   HCT 37.4* 10/28/2015   MCV 92.1 10/28/2015   PLT 140* 10/28/2015    Recent Labs  08/27/15 2317  09/30/15 1056  10/27/15 1123 10/28/15 0444 10/29/15 0448  NA 137  < > 137  < > 142 139 133*  K 2.6*  < > 3.8  < > 4.2 3.5 4.2  CL 99*  < > 99*  < > 102 102 98*  CO2 30  < > 30  < > 30 31 29   GLUCOSE 122*  < > 264*  < > 141* 187* 78  BUN 23*  < > 34*  < > 31* 28* 33*  CREATININE 1.05  < > 1.02  < > 1.47* 1.21 1.33*  CALCIUM 8.4*  < > 8.8*  < > 8.7* 8.5* 8.0*  GFRNONAA >60  < > >60  < > 51* >60 57*  GFRAA >60  < > >60  < > 59* >60 >60  PROT 6.5  --  7.0  --   --   --   --   ALBUMIN 3.8  --  3.9  --   --   --   --   AST 24  --  20  --   --   --   --   ALT 18  --  27  --   --   --   --   ALKPHOS 48  --  72  --   --   --   --   BILITOT 0.7  --  0.9  --   --   --   --   < > = values in this interval not displayed.  RADIOGRAPHIC STUDIES: I have personally reviewed the radiological images as listed and agreed with the findings in the report. Dg Chest 2 View  10/27/2015  CLINICAL DATA:  Shortness of breath, worsening the past 2 days. Chest tightness. 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 Prior CABG. Cardiomegaly. No confluent opacities, effusions or edema. No acute bony abnormality. IMPRESSION: Cardiomegaly.  No active disease. Electronically Signed   By: Rolm Baptise M.D.   On: 10/27/2015 11:47   Dg Chest 2 View  10/13/2015  CLINICAL DATA:  Bilateral lower extremity edema for 2 days. History of congestive heart failure, hypertension and COPD. EXAM: CHEST  2 VIEW COMPARISON:  09/30/2015 radiographs.  CT 08/29/2015. FINDINGS: The left subclavian AICD leads appear unchanged within the right atrium, right ventricle and coronary sinus. There is stable cardiomegaly status post CABG. Chronic vascular congestion is similar to the prior study. There is no  overt pulmonary edema, confluent airspace opacity or pleural effusion. Mild thoracic spine degenerative changes are stable. IMPRESSION: Stable cardiomegaly and chronic vascular congestion. No overt pulmonary edema. Electronically Signed   By: Richardean Sale M.D.   On: 10/13/2015 19:02   US Venous Img Lower Unilateral Right  10/19/2015  CLINICAL DATA:  Cellulitis with worsening pain in the right lower extremity for 2 weeks. EXAM: RIGHT LOWER EXTREMITY VENOUS DOPPLER ULTRASOUND TECHNIQUE: Gray-scale sonography with graded compression, as well as color Doppler and duplex ultrasound were performed to evaluate the lower extremity deep venous systems from the level of the common femoral vein and including the common femoral, femoral, profunda femoral, popliteal and calf veins including the posterior tibial, peroneal and gastrocnemius veins when visible. The superficial great saphenous vein was also interrogated. Spectral Doppler was utilized to evaluate flow at rest and with distal augmentation maneuvers in the common femoral, femoral and popliteal veins. COMPARISON:  10/13/2015 right lower extremity venous Doppler scan. FINDINGS: Contralateral Common Femoral Vein: Respiratory phasicity is normal and symmetric with the symptomatic side. No evidence of thrombus. Normal compressibility. Common Femoral Vein: No evidence of thrombus. Normal compressibility, respiratory phasicity and response to augmentation. Saphenofemoral Junction: No evidence of thrombus. Normal compressibility and flow on color Doppler imaging. Profunda Femoral Vein: No evidence of thrombus. Normal compressibility and flow on color Doppler imaging. Femoral Vein: No evidence of thrombus. Normal compressibility, respiratory phasicity and response to augmentation. Popliteal Vein:  No evidence of thrombus. Normal compressibility, respiratory phasicity and response to augmentation. Calf Veins: No evidence of thrombus. Normal compressibility and flow on color  Doppler imaging. Superficial Great Saphenous Vein: No evidence of thrombus. Normal compressibility and flow on color Doppler imaging. Venous Reflux:  None. Other Findings: Prominent superficial edema throughout the right lower extremity. IMPRESSION: No evidence of deep venous thrombosis in the right lower extremity. Electronically Signed   By: Ilona Sorrel M.D.   On: 10/19/2015 17:01   [place] Extrem Low Right Comp  10/14/2015  CLINICAL DATA:  61 year old male with right leg pain and swelling EXAM: Right LOWER EXTREMITY VENOUS DOPPLER ULTRASOUND TECHNIQUE: Gray-scale sonography with graded compression, as well as color Doppler and duplex ultrasound were performed to evaluate the lower extremity deep venous systems from the level of the common femoral vein and including the common femoral, femoral, profunda femoral, popliteal and calf veins including the posterior tibial, peroneal and gastrocnemius veins when visible. The superficial great saphenous vein was also interrogated. Spectral Doppler was utilized to evaluate flow at rest and with distal augmentation maneuvers in the common femoral, femoral and popliteal veins. COMPARISON:  Ultrasound dated 02/08/2005 FINDINGS: Contralateral Common Femoral Vein: Respiratory phasicity is normal and symmetric with the symptomatic side. No evidence of thrombus. Normal compressibility. Common Femoral Vein: No evidence of thrombus. Normal compressibility, respiratory phasicity and response to augmentation. Saphenofemoral Junction: No evidence of thrombus. Normal compressibility and flow on color Doppler imaging. Profunda Femoral Vein: No evidence of thrombus. Normal compressibility and flow on color Doppler imaging. Femoral Vein: No evidence of thrombus. Normal compressibility, respiratory phasicity and response to augmentation. Popliteal Vein: No evidence of thrombus. Normal compressibility, respiratory phasicity and response to augmentation. Calf Veins: No evidence of thrombus.  Normal compressibility and flow on color Doppler imaging. Superficial Great Saphenous Vein: No evidence of thrombus. Normal compressibility and flow on color Doppler imaging. IMPRESSION: No evidence of deep venous thrombosis in the right lower extremity. Electronically Signed   By: Anner Crete M.D.   On: 10/14/2015 01:00   Dg Chest Portable 1 View  09/30/2015  CLINICAL DATA:  Shortness of breath with chest pain and cough EXAM: PORTABLE CHEST 1 VIEW COMPARISON:  Chest radiograph April 27, 2015; chest CT April 28, 2015 FINDINGS: There is no edema or consolidation. There is cardiomegaly with pulmonary vascularity within normal limits. Defibrillator leads are attached to the right atrium, right ventricle, and left ventricle. No pneumothorax. Lymph node prominence seen on recent CT is not appreciable radiographically. IMPRESSION: Cardiomegaly.  No edema or consolidation. Electronically Signed   By: Lowella Grip III M.D.   On: 09/30/2015 11:52   US Abdomen Limited Ruq  10/29/2015  CLINICAL DATA:  Abdominal distension, generalized abdominal pain EXAM: US ABDOMEN LIMITED - RIGHT UPPER QUADRANT COMPARISON:  08/30/2015 FINDINGS: Gallbladder: Surgically absent Common bile duct: Diameter: 5.3 mm in diameter probable postcholecystectomy Liver: No focal lesion identified. Mild increased echogenicity of the liver suspicious for fatty infiltration. IMPRESSION: 1. Status postcholecystectomy. Normal CBD. Mild increased echogenicity of the liver suspicious for fatty infiltration. Electronically Signed   By: Lahoma Crocker M.D.   On: 10/29/2015 08:44    ASSESSMENT & PLAN:   # Predominant mediastinal adenopathy- measuring up to 25 mm [scan 08/29/2015]. No obvious lung lesions noted. The differential diagnosis includes- benign/reactive versus malignancy. The only way to confirm- is to biopsy.  However given the patient's CHF/renal failure creatinine 1.3-1.47- I would recommend repeating a noncontrast CT of the  chest/even though limited  study- to rule out any obvious worsening of his mediastinal adenopathy. It that is significant worsening of the lymphadenopathy- I would recommend evaluation with pulmonary for possible biopsy.  # shortness of breath/CHF COPD/ Cellulitis.   # The above plan of care was discussed with the patient in detail. My card was given.  Thank you Dr. Leslye Peer for allowing me to participate in the care of your pleasant patient. Please do not hesitate to contact me with questions or concerns in the interim.     Cammie Sickle, MD 10/29/2015 12:40 PM

## 2015-10-29 NOTE — Progress Notes (Signed)
[facility] INFECTI[facility] PROGRESS NOTE Date of Admission:  10/27/2015     ID: Ronald Fusi. is a 60 y.o. male with  CHF and LE cellulitis  Active Problems:   Acute on chronic systolic CHF (congestive heart failure) (HCC)  Subjective: Diuresing, redness improving, afebrile  ROS  Eleven systems are reviewed and negative except per hpi  Medications:  Antibiotics Given (last 72 hours)    Date/Time Action Medication Dose Rate   10/27/15 2228 Given   ceFAZolin (ANCEF) IVPB 1 g/50 mL premix 1 g 100 mL/hr   10/28/15 0534 Given   ceFAZolin (ANCEF) IVPB 1 g/50 mL premix 1 g 100 mL/hr   10/28/15 1335 Given   ceFAZolin (ANCEF) IVPB 1 g/50 mL premix 1 g 100 mL/hr   10/28/15 2031 Given   cephALEXin (KEFLEX) capsule 500 mg 500 mg    10/29/15 [id number] Given   cephALEXin (KEFLEX) capsule 500 mg 500 mg    10/29/15 1402 Given   cephALEXin (KEFLEX) capsule 500 mg 500 mg      . aspirin EC  81 mg Oral Daily  . atorvastatin  20 mg Oral QPM  . cephALEXin  500 mg Oral 3 times per day  . escitalopram  20 mg Oral Daily  . insulin aspart  0-15 Units Subcutaneous TID WC  . insulin glargine  30 Units Subcutaneous QHS  . potassium chloride  20 mEq Oral Daily    Objective: Vital signs in last 24 hours: Temp:  [97.4 F (36.3 C)-98 F (36.7 C)] 97.7 F (36.5 C) (01/25 1242) Pulse Rate:  [66-131] 75 (01/25 1242) Resp:  [16-18] 18 (01/25 1242) BP: (87-135)/(57-99) 135/99 mmHg (01/25 1242) SpO2:  [90 %-96 %] 90 % (01/25 1242) Weight:  [120.702 kg (266 lb 1.6 oz)] 120.702 kg (266 lb 1.6 oz) (01/25 0455) Constitutional: He is oriented to person, place, and time. He appears well-developed and well-nourished. No distress.  HENT:  Mouth/Throat: Oropharynx is clear and moist. No oropharyngeal exudate.  Cardiovascular: Normal rate, regular rhythm and normal heart sounds. Exam reveals no gallop and no friction rub.  No murmur heard.  Pulmonary/Chest: Effort normal and breath sounds normal.  No respiratory distress. He has no wheezes.  Abdominal: Soft. Bowel sounds are normal. He exhibits no distension. There is no tenderness.  Lymphadenopathy: He has no cervical adenopathy.  Ext 2+ RLE edema to upper thigh, has scars from venous harvesting Skin: RLE with brawny hyperpigmented skin but no active redness. Has scab ant shin  Dorsum of foot just before toes has shallow area of skin breakdown, ulceration - mod darainge Psychiatric: He has a normal mood and affect. His behavior is normal.   Lab Results  Recent Labs  10/27/15 1123 10/28/15 0444 10/29/15 0448  WBC 7.7 5.4  --   HGB 12.6* 11.9*  --   HCT 38.4* 37.4*  --   NA 142 139 133*  K 4.2 3.5 4.2  CL 102 102 98*  CO2 30 31 29   BUN 31* 28* 33*  CREATININE 1.47* 1.21 1.33*    Microbiology: Results for orders placed or performed during the hospital encounter of 10/13/15  MRSA PCR Screening     Status: None   Collection Time: 10/14/15  2:05 AM  Result Value Ref Range Status   MRSA by PCR NEGATIVE NEGATIVE Final    Comment:        The GeneXpert MRSA Assay (FDA approved for NASAL specimens only), is one component of a comprehensive MRSA colonization  surveillance program. It is not intended to diagnose MRSA infection nor to guide or monitor treatment for MRSA infections.   Wound culture     Status: None   Collection Time: 10/20/15  3:54 PM  Result Value Ref Range Status   Specimen Description LEG RIGHT  Final   Special Requests Normal  Final   Gram Stain RARE WBC SEEN FEW GRAM POSITIVE COCCI IN PAIRS   Final   Culture NORMAL SKIN FLORA  Final   Report Status 10/23/2015 FINAL  Final    Studies/Results: US Abdomen Limited Ruq  10/29/2015  CLINICAL DATA:  Abdominal distension, generalized abdominal pain EXAM: US ABDOMEN LIMITED - RIGHT UPPER QUADRANT COMPARISON:  08/30/2015 FINDINGS: Gallbladder: Surgically absent Common bile duct: Diameter: 5.3 mm in diameter probable postcholecystectomy Liver: No focal  lesion identified. Mild increased echogenicity of the liver suspicious for fatty infiltration. IMPRESSION: 1. Status postcholecystectomy. Normal CBD. Mild increased echogenicity of the liver suspicious for fatty infiltration. Electronically Signed   By: Ronald Miller M.D.   On: 10/29/2015 08:44    Assessment/Plan: Ronald Okin. is a 60 y.o. male with CHF EF 20-25% readmitted with vol overload. At last admission he had RLE cellulitis and a shallow ulcer on dorsum of foot. He responded well to abx and unnaboot. His cellulitis is much improved and his wound is improving. No fevers, nml WBC. Wound cx last admit with Nml flora  Recommendations Continue diuresis which will help the most Elevate legs on 6 pillows.  I have cultured the moist wound on his dorsum of his foot which has been slow to heal Cont oral keflex pending the culture result. If redness recurs on RLE would replace unawrap Thank you very much for the consult. Will follow with you.  [place], [place]   10/29/2015, 3:30 PM

## 2015-10-30 ENCOUNTER — Inpatient Hospital Stay: Payer: Medicare HMO

## 2015-10-30 DIAGNOSIS — R140 Abdominal distension (gaseous): Secondary | ICD-10-CM

## 2015-10-30 LAB — GLUCOSE, CAPILLARY
GLUCOSE-CAPILLARY: 178 mg/dL — AB (ref 65–99)
GLUCOSE-CAPILLARY: 86 mg/dL (ref 65–99)
Glucose-Capillary: 171 mg/dL — ABNORMAL HIGH (ref 65–99)
Glucose-Capillary: 118 mg/dL — ABNORMAL HIGH (ref 65–99)

## 2015-10-30 LAB — BODY FLUID CELL COUNT WITH DIFFERENTIAL
Eos, Fluid: 0 %
Lymphs, Fluid: 34 %
Monocyte-Macrophage-Serous Fluid: 63 %
Neutrophil Count, Fluid: 3 %
OTHER CELLS FL: 0 %
Total Nucleated Cell Count, Fluid: 379 cu mm

## 2015-10-30 LAB — BASIC METABOLIC PANEL
Anion gap: 3 — ABNORMAL LOW (ref 5–15)
BUN: 34 mg/dL — ABNORMAL HIGH (ref 6–20)
CALCIUM: 8.6 mg/dL — AB (ref 8.9–10.3)
CHLORIDE: 99 mmol/L — AB (ref 101–111)
CO2: 35 mmol/L — AB (ref 22–32)
CREATININE: 1.38 mg/dL — AB (ref 0.61–1.24)
GFR calc Af Amer: >60 mL/min (ref >60)
GFR calc non Af Amer: 55 mL/min — ABNORMAL LOW (ref >60)
GLUCOSE: 172 mg/dL — AB (ref 65–99)
Potassium: 4.5 mmol/L (ref 3.5–5.1)
Sodium: 137 mmol/L (ref 135–145)

## 2015-10-30 LAB — PROTEIN, BODY FLUID: TOTAL PROTEIN, FLUID: 3.2 g/dL

## 2015-10-30 MED ORDER — FUROSEMIDE 40 MG PO TABS
40.0000 mg | ORAL_TABLET | Freq: Two times a day (BID) | ORAL | Status: DC
  Administered 2015-10-30: 40 mg via ORAL
  Filled 2015-10-30: qty 1

## 2015-10-30 MED ORDER — POLYETHYLENE GLYCOL 3350 17 G PO PACK
17.0000 g | PACK | Freq: Every day | ORAL | Status: DC
  Administered 2015-10-30 – 2015-10-31 (×2): 17 g via ORAL
  Filled 2015-10-30 (×5): qty 1

## 2015-10-30 MED ORDER — LACTULOSE 10 GM/15ML PO SOLN
30.0000 g | Freq: Two times a day (BID) | ORAL | Status: DC
  Administered 2015-10-30 – 2015-11-03 (×9): 30 g via ORAL
  Filled 2015-10-30 (×9): qty 60

## 2015-10-30 MED ORDER — MAGNESIUM CITRATE PO SOLN
1.0000 | Freq: Once | ORAL | Status: DC
  Filled 2015-10-30: qty 296

## 2015-10-30 NOTE — Progress Notes (Signed)
Ronald Miller.   DOB:[birth year]   MN:[id number]    Subjective:   Patient's breathing is better. However he still concerned about distention of the abdomen. Continues to have leg swelling is improving.   Objective:  Filed Vitals:   10/29/15 2032 10/30/15 0528  BP: 119/82 110/85  Pulse: 69 70  Temp: 97.7 F (36.5 C) 97.5 F (36.4 C)  Resp: 16 16     Intake/Output Summary (Last 24 hours) at 10/30/15 1056 Last data filed at 10/30/15 0749  Gross per 24 hour  Intake    720 ml  Output   1800 ml  Net  -1080 ml    GENERAL: Well-nourished well-developed; Alert, no distress and comfortable. Alone. EYES: no pallor or icterus OROPHARYNX: no thrush or ulceration;  NECK: supple, no masses felt LYMPH: no palpable lymphadenopathy in the cervical, axillary or inguinal regions LUNGS:Decreased breath sounds to auscultation bilaterally at bases; and No wheeze or crackles HEART/CVS: regular rate & rhythm and no murmurs; No lower extremity edema ABDOMEN: abdomen soft, non-tender and normal bowel sounds; protuberant. Musculoskeletal:no cyanosis of digits and no clubbing  PSYCH: alert & oriented x 3 with fluent speech NEURO: no focal motor/sensory deficits SKIN: no rashes or significant lesions   Labs:  Lab Results  Component Value Date   WBC 5.4 10/28/2015   HGB 11.9* 10/28/2015   HCT 37.4* 10/28/2015   MCV 92.1 10/28/2015   PLT 140* 10/28/2015   NEUTROABS 5.3 10/20/2015    Lab Results  Component Value Date   NA 137 10/30/2015   K 4.5 10/30/2015   CL 99* 10/30/2015   CO2 35* 10/30/2015    Studies:  Ct Chest Wo Contrast  10/29/2015  CLINICAL DATA:  60 year old male with history of diabetes hypertension chronic systolic heart failure. Short of breath and abdominal wall edema. EXAM: CT CHEST WITHOUT CONTRAST TECHNIQUE: Multidetector CT imaging of the chest was performed following the standard protocol without IV contrast. COMPARISON:  10/27/2015, CT 11/25/ 16 FINDINGS:  Mediastinum/Nodes: Pacemaker in LEFT chest wall. No axillary or supraclavicular lymphadenopathy. There are multiple small mediastinal lymph nodes not changed from prior. Heart is enlarged. Esophagus normal. Lungs/Pleura: There is no pulmonary edema or interstitial edema. No pleural fluid. Airways are normal. Upper abdomen: Moderate volume of fluid surrounding the liver and spleen. Postcholecystectomy. Adrenal glands are normal. Musculoskeletal: No aggressive osseous lesion. IMPRESSION: 1. Cardiomegaly and mediastinal adenopathy not changed from prior. Adenopathy is likely reactive. 2. No evidence of pulmonary edema or pleural effusions. 3. Upper abdominal fluid likely relates to heart failure or ascites. Electronically Signed   By: Suzy Bouchard M.D.   On: 10/29/2015 15:59   US Abdomen Limited  10/30/2015  CLINICAL DATA:  Generalized abdominal pain for 2 months evaluate for ascites in the lower abdomen, ascites suggested on CT thorax, abdominal distention EXAM: LIMITED ABDOMEN ULTRASOUND FOR ASCITES TECHNIQUE: Limited ultrasound survey for ascites was performed in all four abdominal quadrants. COMPARISON:  10/29/2015 CT thorax FINDINGS: A small to moderate volume of ascites is seen throughout the entire abdomen. IMPRESSION: Ascites present. Electronically Signed   By: Skipper Cliche M.D.   On: 10/30/2015 10:22   US Abdomen Limited Ruq  10/29/2015  CLINICAL DATA:  Abdominal distension, generalized abdominal pain EXAM: US ABDOMEN LIMITED - RIGHT UPPER QUADRANT COMPARISON:  08/30/2015 FINDINGS: Gallbladder: Surgically absent Common bile duct: Diameter: 5.3 mm in diameter probable postcholecystectomy Liver: No focal lesion identified. Mild increased echogenicity of the liver suspicious for fatty infiltration. IMPRESSION: 1. Status  postcholecystectomy. Normal CBD. Mild increased echogenicity of the liver suspicious for fatty infiltration. Electronically Signed   By: Lahoma Crocker M.D.   On: 10/29/2015 08:44     Assessment & Plan:   #mediastinal adenopathy- initially noted incidentally on scan in November 2016. The repeat CT scan without contrast- does not show any progression of the adenopathy; stable. No new lesions noted. Likely reactive/benign. I would not recommend further workup at this time unless patient starts to have any symptoms.  # this was discussed with the patient he agrees. Also discussed with hospitalist Dr. Posey Pronto. I reviewed the images of the CAT scan myself.   Cammie Sickle, MD 10/30/2015  10:56 AM

## 2015-10-30 NOTE — Procedures (Signed)
Successful [place] LG VOL PARACENTESIS No comp Stable EBL 0 LABS SENT Full report in PACS

## 2015-10-30 NOTE — Progress Notes (Signed)
Initial Nutrition Assessment   INTERVENTION:   Meals and Snacks: Cater to patient preferences on heart/Carb modified diet order. Education: RD educated pt on last admission on 10/18/2015 on Low Sodium Nutrition therapy and Carbohydrate controlled diabetic nutrition therapy. Pt reports having information. RD reviewed grams of carbohydrates in food items as pt asked about menu labeling here. Pt reports being familiar with low sodium and has been doing PTA.   NUTRITION DIAGNOSIS:    No nutrition diagnosis at this time  GOAL:   Patient will meet greater than or equal to 90% of their needs  MONITOR:    (Energy Intake, Anthropometrics, Glucose Profile)  REASON FOR ASSESSMENT:   Diagnosis    ASSESSMENT:   Pt admitted with CHF exacerbation with difficulty breathing and oozing at legs with lower extremity edema.  Past Medical History  Diagnosis Date  . ASCVD (arteriosclerotic cardiovascular disease)   . Diabetes mellitus   . Hypertension   . History of cocaine abuse   . Tobacco abuse   . Depression   . Suicidal ideation   . Chronic systolic CHF (congestive heart failure) ([place])   . COPD (chronic obstructive pulmonary disease) ([place])   . Coronary artery disease      Diet Order:  Diet heart healthy/carb modified Room service appropriate?: Yes; Fluid consistency:: Thin    Current Nutrition: Pt reports good appetite, pt eating 100% of meals since admission.  Food/Nutrition-Related History: Pt reports good appetite PTA.   Scheduled Medications:  . aspirin EC  81 mg Oral Daily  . atorvastatin  20 mg Oral QPM  . cephALEXin  500 mg Oral 3 times per day  . escitalopram  20 mg Oral Daily  . furosemide  40 mg Oral BID  . insulin aspart  0-15 Units Subcutaneous TID WC  . insulin glargine  30 Units Subcutaneous QHS  . lactulose  30 g Oral BID  . polyethylene glycol  17 g Oral Daily  . potassium chloride  20 mEq Oral Daily     Electrolyte/Renal Profile and Glucose Profile:    Recent Labs Lab 10/28/15 0444 10/29/15 0448 10/30/15 0429  NA 139 133* 137  K 3.5 4.2 4.5  CL 102 98* 99*  CO2 31 29 35*  BUN 28* 33* 34*  CREATININE 1.21 1.33* 1.38*  CALCIUM 8.5* 8.0* 8.6*  GLUCOSE 187* 78 172*   Protein Profile: No results for input(s): ALBUMIN in the last 168 hours.  Gastrointestinal Profile: Last BM:  10/30/2015   Weight Change:  Filed Weights   10/28/15 0502 10/29/15 0455 10/30/15 0528  Weight: 261 lb 4.8 oz (118.525 kg) 266 lb 1.6 oz (120.702 kg) 267 lb 14.4 oz (121.519 kg)   Height:   Ht Readings from Last 1 Encounters:  10/27/15 5\' 9"  (1.753 m)    Weight:   Wt Readings from Last 1 Encounters:  10/30/15 267 lb 14.4 oz (121.519 kg)    BMI:  Body mass index is 39.54 kg/(m^2).   EDUCATION NEEDS:   Education needs addressed on 10/18/2015, reviewed on visit today  [place], RD, LDN Pager [phone] Weekend/On-Call Pager [phone]

## 2015-10-30 NOTE — Progress Notes (Addendum)
Patient ID: Ronald Dorame., male   DOB: July 13, 1956, 60 y.o.   MRN: [id number] [facility] PROGRESS NOTE  Ronald Miller. [id number] DOB: [birth year] DOA: 10/27/2015 PCP: Pcp Not In System  HPI/Subjective: Patient's breathing is improved continues to complain of abdominal distention.  Objective: Filed Vitals:   10/29/15 2032 10/30/15 0528  BP: 119/82 110/85  Pulse: 69 70  Temp: 97.7 F (36.5 C) 97.5 F (36.4 C)  Resp: 16 16    Filed Weights   10/28/15 0502 10/29/15 0455 10/30/15 0528  Weight: 118.525 kg (261 lb 4.8 oz) 120.702 kg (266 lb 1.6 oz) 121.519 kg (267 lb 14.4 oz)    ROS: Review of Systems  Constitutional: Negative for fever and chills.  Eyes: Negative for blurred vision.  Respiratory: Positive for shortness of breath. Negative for cough.   Cardiovascular: Negative for chest pain.  Gastrointestinal: Positive for nausea and abdominal pain. Negative for vomiting, diarrhea and constipation.  Genitourinary: Negative for dysuria.  Musculoskeletal: Negative for joint pain.  Neurological: Negative for dizziness and headaches.   Exam: Physical Exam  Constitutional: He is oriented to person, place, and time.  HENT:  Nose: No mucosal edema.  Mouth/Throat: No oropharyngeal exudate or posterior oropharyngeal edema.  Eyes: Conjunctivae, EOM and lids are normal. Pupils are equal, round, and reactive to light.  Neck: No JVD present. Carotid bruit is not present. No edema present. No thyroid mass and no thyromegaly present.  Cardiovascular: S1 normal and S2 normal.  Exam reveals no gallop.   No murmur heard. Pulses:      Dorsalis pedis pulses are 0 on the right side, and 0 on the left side.  Respiratory: No respiratory distress. He has decreased breath sounds in the right lower field and the left lower field. He has no wheezes. He has no rhonchi. He has no rales.  GI: Soft. Bowel sounds are normal. There is no tenderness.  abdomen distended   Musculoskeletal:       Right ankle: He exhibits swelling.       Left ankle: He exhibits swelling.  Lymphadenopathy:    He has no cervical adenopathy.  Neurological: He is alert and oriented to person, place, and time. No cranial nerve deficit.  Skin: Skin is warm. No rash noted. Nails show no clubbing.  Psychiatric: He has a normal mood and affect.      Data Reviewed: Basic Metabolic Panel:  Recent Labs Lab 10/27/15 1123 10/28/15 0444 10/29/15 0448 10/30/15 0429  NA 142 139 133* 137  K 4.2 3.5 4.2 4.5  CL 102 102 98* 99*  CO2 30 31 29  35*  GLUCOSE 141* 187* 78 172*  BUN 31* 28* 33* 34*  CREATININE 1.47* 1.21 1.33* 1.38*  CALCIUM 8.7* 8.5* 8.0* 8.6*   CBC:  Recent Labs Lab 10/27/15 1123 10/28/15 0444  WBC 7.7 5.4  HGB 12.6* 11.9*  HCT 38.4* 37.4*  MCV 91.7 92.1  PLT 149* 140*    BNP (last 3 results)  Recent Labs  09/30/15 1056 10/13/15 1942 10/27/15 1123  BNP 2375.0* 2776.0* 1686.0*    CBG:  Recent Labs Lab 10/29/15 1207 10/29/15 1645 10/29/15 2034 10/29/15 2123 10/30/15 0755  GLUCAP 230* 212* 143* 145* 171*    Recent Results (from the past 240 hour(s))  Wound culture     Status: None   Collection Time: 10/20/15  3:54 PM  Result Value Ref Range Status   Specimen Description LEG RIGHT  Final   Special Requests Normal  Final   Gram Stain RARE WBC SEEN FEW GRAM POSITIVE COCCI IN PAIRS   Final   Culture NORMAL SKIN FLORA  Final   Report Status 10/23/2015 FINAL  Final     Studies: Ct Chest Wo Contrast  10/29/2015  CLINICAL DATA:  60 year old male with history of diabetes hypertension chronic systolic heart failure. Short of breath and abdominal wall edema. EXAM: CT CHEST WITHOUT CONTRAST TECHNIQUE: Multidetector CT imaging of the chest was performed following the standard protocol without IV contrast. COMPARISON:  10/27/2015, CT 11/25/ 16 FINDINGS: Mediastinum/Nodes: Pacemaker in LEFT chest wall. No axillary or supraclavicular  lymphadenopathy. There are multiple small mediastinal lymph nodes not changed from prior. Heart is enlarged. Esophagus normal. Lungs/Pleura: There is no pulmonary edema or interstitial edema. No pleural fluid. Airways are normal. Upper abdomen: Moderate volume of fluid surrounding the liver and spleen. Postcholecystectomy. Adrenal glands are normal. Musculoskeletal: No aggressive osseous lesion. IMPRESSION: 1. Cardiomegaly and mediastinal adenopathy not changed from prior. Adenopathy is likely reactive. 2. No evidence of pulmonary edema or pleural effusions. 3. Upper abdominal fluid likely relates to heart failure or ascites. Electronically Signed   By: Suzy Bouchard M.D.   On: 10/29/2015 15:59   US Abdomen Limited Ruq  10/29/2015  CLINICAL DATA:  Abdominal distension, generalized abdominal pain EXAM: US ABDOMEN LIMITED - RIGHT UPPER QUADRANT COMPARISON:  08/30/2015 FINDINGS: Gallbladder: Surgically absent Common bile duct: Diameter: 5.3 mm in diameter probable postcholecystectomy Liver: No focal lesion identified. Mild increased echogenicity of the liver suspicious for fatty infiltration. IMPRESSION: 1. Status postcholecystectomy. Normal CBD. Mild increased echogenicity of the liver suspicious for fatty infiltration. Electronically Signed   By: Lahoma Crocker M.D.   On: 10/29/2015 08:44    Scheduled Meds: . aspirin EC  81 mg Oral Daily  . atorvastatin  20 mg Oral QPM  . cephALEXin  500 mg Oral 3 times per day  . escitalopram  20 mg Oral Daily  . furosemide  40 mg Intravenous BID  . furosemide  40 mg Oral BID  . insulin aspart  0-15 Units Subcutaneous TID WC  . insulin glargine  30 Units Subcutaneous QHS  . lactulose  30 g Oral BID  . magnesium citrate  1 Bottle Oral Once  . polyethylene glycol  17 g Oral Daily  . potassium chloride  20 mEq Oral Daily    Assessment/Plan:  1. Relative hypotension-not improved I will restart his Lasix 2. Abdominal distention likely has ascites , will obtain  ultrasound of abdomen if fluid drain 3. Acute on chronic systolic congestive heart failure with cardiomyopathy and lower extremity anasarca. Resume oral Lasix  4. Cellulitis of the right lower extremity with ulcer on the foot.  Keflex. Appreciate ID input 5. Anxiety depression on Lexapro. 6. Diabetes type 2 on glargine insulin and sliding scale. 7. Acute renal insufficiency- renal function appears to be stable 8. Hypokalemia replaced 9. Hyperlipidemia unspecified on atorvastatin 10. Lymphadenopathy in the chest on previous ct chest- unclear etiology- oncology consult appreciated in follow-up with oncology and repeat imaging in the near future   Code Status:     Code Status Orders        Start     Ordered   10/27/15 1915  Full code   Continuous     10/27/15 1917    Code Status History    Date Active Date Inactive Code Status Order ID Comments User Context   10/14/2015  1:39 AM 10/21/2015  8:57 PM Full Code [id number]  Saundra Shelling, MD Inpatient   08/28/2015  2:40 AM 09/01/2015  8:01 PM Full Code [id number]  Lance Coon, MD Inpatient     Family Communication: Mother yesterday Disposition Plan: TBD  Antibiotics:  Keflex  Time spent: 51minutes  Jeriel Vivanco, [facility]  [facility]            P

## 2015-10-30 NOTE — Care Management Note (Signed)
Case Management Note  Patient Details  Name: Ronald Miller. MRN: [id number] Date of Birth: 04-12-1956  Subjective/Objective:   Requested PT consult from primary nurse.                  Action/Plan:   Expected Discharge Date:                  Expected Discharge Plan:     In-House Referral:     Discharge planning Services  CM Consult  Post Acute Care Choice:    Choice offered to:     DME Arranged:    DME Agency:     HH Arranged:    HH Agency:     Status of Service:  In process, will continue to follow  Medicare Important Message Given:  Yes Date Medicare IM Given:    Medicare IM give by:    Date Additional Medicare IM Given:    Additional Medicare Important Message give by:     If discussed at [place] of Stay Meetings, dates discussed:    Additional Comments:  Jolly Mango, RN 10/30/2015, 1:22 PM

## 2015-10-31 LAB — GLUCOSE, CAPILLARY
GLUCOSE-CAPILLARY: 136 mg/dL — AB (ref 65–99)
GLUCOSE-CAPILLARY: 201 mg/dL — AB (ref 65–99)
Glucose-Capillary: 176 mg/dL — ABNORMAL HIGH (ref 65–99)
Glucose-Capillary: 143 mg/dL — ABNORMAL HIGH (ref 65–99)

## 2015-10-31 LAB — PROTEIN, TOTAL: Total Protein: 6.3 g/dL — ABNORMAL LOW (ref 6.5–8.1)

## 2015-10-31 MED ORDER — FUROSEMIDE 10 MG/ML IJ SOLN
10.0000 mg/h | INTRAMUSCULAR | Status: AC
  Administered 2015-10-31: 10 mg/h via INTRAVENOUS
  Filled 2015-10-31 (×2): qty 25

## 2015-10-31 MED ORDER — DIGOXIN 250 MCG PO TABS
0.2500 mg | ORAL_TABLET | Freq: Every day | ORAL | Status: DC
  Administered 2015-10-31 – 2015-11-03 (×4): 0.25 mg via ORAL
  Filled 2015-10-31 (×4): qty 1

## 2015-10-31 MED ORDER — DIPHENHYDRAMINE HCL 25 MG PO CAPS: 25.0000 mg | ORAL_CAPSULE | Freq: Four times a day (QID) | ORAL | Status: DC | PRN

## 2015-10-31 NOTE — Progress Notes (Signed)
Patient ID: Ronald Vasiliou., male   DOB: 01-27-1956, 60 y.o.   MRN: [id number] [facility] PROGRESS NOTE  Ronald Miller. [id number] DOB: 1956/02/04 DOA: 10/27/2015 PCP: Pcp Not In System  HPI/Subjective: Patient had paracentesis yesterday had 3 L of fluid removed, still complains of abdominal swelling and swelling in his lower extremities.  Objective: Filed Vitals:   10/30/15 1944 10/31/15 0404  BP: 105/78 114/74  Pulse: 73 81  Temp: 97.2 F (36.2 C) 98.3 F (36.8 C)  Resp: 22 24    Filed Weights   10/29/15 0455 10/30/15 0528 10/31/15 0404  Weight: 120.702 kg (266 lb 1.6 oz) 121.519 kg (267 lb 14.4 oz) 119.115 kg (262 lb 9.6 oz)    ROS: Review of Systems  Constitutional: Negative for fever and chills.  Eyes: Negative for blurred vision.  Respiratory: Positive for shortness of breath. Negative for cough.   Cardiovascular: Negative for chest pain.  Gastrointestinal: Positive for nausea and resolved abdominal pain. Negative for vomiting, diarrhea and constipation.  Genitourinary: Negative for dysuria.  Musculoskeletal: Negative for joint pain.  Neurological: Negative for dizziness and headaches.   Exam: Physical Exam  Constitutional: He is oriented to person, place, and time.  HENT:  Nose: No mucosal edema.  Mouth/Throat: No oropharyngeal exudate or posterior oropharyngeal edema.  Eyes: Conjunctivae, EOM and lids are normal. Pupils are equal, round, and reactive to light.  Neck: No JVD present. Carotid bruit is not present. No edema present. No thyroid mass and no thyromegaly present.  Cardiovascular: S1 normal and S2 normal.  Exam reveals no gallop.   No murmur heard. Pulses:      Dorsalis pedis pulses are 0 on the right side, and 0 on the left side.  Respiratory: No respiratory distress. He has decreased breath sounds in the right lower field and the left lower field. He has no wheezes. He has no rhonchi. He has no rales.  GI: Soft. Bowel sounds  are normal. There is no tenderness.  abdomen distended  Musculoskeletal:       Right ankle: He exhibits swelling.       Left ankle: He exhibits swelling.  Lymphadenopathy:    He has no cervical adenopathy.  Neurological: He is alert and oriented to person, place, and time. No cranial nerve deficit.  Skin: Skin is warm. No rash noted. Nails show no clubbing.  Psychiatric: He has a normal mood and affect.      Data Reviewed: Basic Metabolic Panel:  Recent Labs Lab 10/27/15 1123 10/28/15 0444 10/29/15 0448 10/30/15 0429  NA 142 139 133* 137  K 4.2 3.5 4.2 4.5  CL 102 102 98* 99*  CO2 30 31 29  35*  GLUCOSE 141* 187* 78 172*  BUN 31* 28* 33* 34*  CREATININE 1.47* 1.21 1.33* 1.38*  CALCIUM 8.7* 8.5* 8.0* 8.6*   CBC:  Recent Labs Lab 10/27/15 1123 10/28/15 0444  WBC 7.7 5.4  HGB 12.6* 11.9*  HCT 38.4* 37.4*  MCV 91.7 92.1  PLT 149* 140*    BNP (last 3 results)  Recent Labs  09/30/15 1056 10/13/15 1942 10/27/15 1123  BNP 2375.0* 2776.0* 1686.0*    CBG:  Recent Labs Lab 10/30/15 0755 10/30/15 1134 10/30/15 1733 10/30/15 2117 10/31/15 0746  GLUCAP 171* 178* 86 118* 136*    No results found for this or any previous visit (from the past 240 hour(s)).   Studies: Ct Chest Wo Contrast  10/29/2015  CLINICAL DATA:  60 year old male with history  of diabetes hypertension chronic systolic heart failure. Short of breath and abdominal wall edema. EXAM: CT CHEST WITHOUT CONTRAST TECHNIQUE: Multidetector CT imaging of the chest was performed following the standard protocol without IV contrast. COMPARISON:  10/27/2015, CT 11/25/ 16 FINDINGS: Mediastinum/Nodes: Pacemaker in LEFT chest wall. No axillary or supraclavicular lymphadenopathy. There are multiple small mediastinal lymph nodes not changed from prior. Heart is enlarged. Esophagus normal. Lungs/Pleura: There is no pulmonary edema or interstitial edema. No pleural fluid. Airways are normal. Upper abdomen: Moderate  volume of fluid surrounding the liver and spleen. Postcholecystectomy. Adrenal glands are normal. Musculoskeletal: No aggressive osseous lesion. IMPRESSION: 1. Cardiomegaly and mediastinal adenopathy not changed from prior. Adenopathy is likely reactive. 2. No evidence of pulmonary edema or pleural effusions. 3. Upper abdominal fluid likely relates to heart failure or ascites. Electronically Signed   By: Suzy Bouchard M.D.   On: 10/29/2015 15:59   US Abdomen Limited  10/30/2015  CLINICAL DATA:  Generalized abdominal pain for 2 months evaluate for ascites in the lower abdomen, ascites suggested on CT thorax, abdominal distention EXAM: LIMITED ABDOMEN ULTRASOUND FOR ASCITES TECHNIQUE: Limited ultrasound survey for ascites was performed in all four abdominal quadrants. COMPARISON:  10/29/2015 CT thorax FINDINGS: A small to moderate volume of ascites is seen throughout the entire abdomen. IMPRESSION: Ascites present. Electronically Signed   By: Skipper Cliche M.D.   On: 10/30/2015 10:22   US Paracentesis  10/31/2015  CLINICAL DATA:  CHF, abdominal ascites, distention and discomfort. EXAM: ULTRASOUND GUIDED PARACENTESIS COMPARISON:  10/30/2015 PROCEDURE: An ultrasound guided paracentesis was thoroughly discussed with the patient and questions answered. The benefits, risks, alternatives and complications were also discussed. The patient understands and wishes to proceed with the procedure. Written consent was obtained. Ultrasound was performed to localize and mark an adequate pocket of fluid in the right lower quadrant of the abdomen. The area was then prepped and draped in the normal sterile fashion. 1% Lidocaine was used for local anesthesia. Under ultrasound guidance a safety centesis needle catheter was introduced. Paracentesis was performed. The catheter was removed and a dressing applied. COMPLICATIONS: None immediate FINDINGS: A total of approximately 3 L of clear peritoneal fluid was removed. A fluid  sample was sent for laboratory analysis. IMPRESSION: Successful ultrasound guided paracentesis yielding 3 L of ascites. Electronically Signed   By: Jerilynn Mages.  Shick M.D.   On: 10/31/2015 08:12    Scheduled Meds: . aspirin EC  81 mg Oral Daily  . atorvastatin  20 mg Oral QPM  . cephALEXin  500 mg Oral 3 times per day  . digoxin  0.25 mg Oral Daily  . escitalopram  20 mg Oral Daily  . insulin aspart  0-15 Units Subcutaneous TID WC  . insulin glargine  30 Units Subcutaneous QHS  . lactulose  30 g Oral BID  . polyethylene glycol  17 g Oral Daily  . potassium chloride  20 mEq Oral Daily    Assessment/Plan:  1. Relative hypotension- now resolved, patient still fluid overloaded and was try IV Lasix drip 2. Abdominal distention due to ascites status post drainage due to acute on chronic systolic CHF 3. Acute on chronic systolic congestive heart failure with cardiomyopathy and lower extremity anasarca. Resume oral Lasix  4. Cellulitis of the right lower extremity with ulcer on the foot.  Keflex. Appreciate ID input 5. Anxiety depression on Lexapro. 6. Diabetes type 2 on glargine insulin and sliding scale. Continue 7. Acute renal insufficiency- renal function appears to be stable monitor  with the Lasix drip 8. Hypokalemia replaced 9. Hyperlipidemia unspecified on atorvastatin 10. Lymphadenopathy in the chest on previous ct chest- unclear etiology- oncology consult appreciated in follow-up with oncology and repeat imaging in the near future   Code Status:     Code Status Orders        Start     Ordered   10/27/15 1915  Full code   Continuous     10/27/15 1917    Code Status History    Date Active Date Inactive Code Status Order ID Comments User Context   10/14/2015  1:39 AM 10/21/2015  8:57 PM Full Code [id number]  Saundra Shelling, MD Inpatient   08/28/2015  2:40 AM 09/01/2015  8:01 PM Full Code [id number]  Lance Coon, MD Inpatient     Family Communication: Mother yesterday Disposition  Plan: TBD  Antibiotics:  Keflex  Time spent: 49minutes  Matalie Romberger, [facility]

## 2015-10-31 NOTE — Evaluation (Signed)
Physical Therapy Evaluation Patient Details Name: Ronald Miller. MRN: [id number] DOB: [birth year] Today's Date: 10/31/2015   History of Present Illness  Pt was here recently with b/l LE swelling/cellulitis.  He returns 1 week later now with shortness of breath and acute on chronic hearth failure with R LE swelling/pain and weakness.   Clinical Impression  Pt reporting R LE pain, but generally has been feeling better and is not having shortness of breath.  He is able to ambulate 200 ft and negotiate steps (but only with step-to strategy, unsafe with recip).  He has some fatigue with ambulation but is O2 remains in the mid 90s t/o the effort and generally he has no significant safety concerns despite some fatigue.      Follow Up Recommendations Home health PT    Equipment Recommendations  None recommended by PT    Recommendations for Other Services       Precautions / Restrictions Restrictions Weight Bearing Restrictions: No      Mobility  Bed Mobility               General bed mobility comments: Pt up in recliner on arrival  Transfers Overall transfer level: Independent               General transfer comment: Pt able to rise w/o AD with good confidence and safety.  He reports R LE pain but otherwise is able to rise w/o issue.   Ambulation/Gait Ambulation/Gait assistance: Supervision Ambulation Distance (Feet): 200 Feet Assistive device: None       General Gait Details: Pt able to ambulate with good confidence and only minimal R knee/leg pain.  He does have considerable faitgue with the effort and needs a few brief standing rest breaks.   Stairs Stairs: Yes Stairs assistance: Min guard Stair Management: One rail Right Number of Stairs: 3 General stair comments: Pt is able to negotiate up/down steps with L foot leading up/R down.  Despite cuing he attempted once with the wrong sequencing and he did buckle with his R knee secondary to pain  Wheelchair  Mobility    Modified Rankin (Stroke Patients Only)       Balance                                             Pertinent Vitals/Pain Pain Assessment: 0-10 Pain Score: 6  Pain Location: general R LE pain/swelling    Home Living Family/patient expects to be discharged to:: Private residence Living Arrangements: Alone Available Help at Discharge: Family (mother drives him, etc)                  Prior Function Level of Independence: Independent         Comments: does not drive, but reports he can walk for grocery shopping. etc     Hand Dominance        Extremity/Trunk Assessment   Upper Extremity Assessment: Overall WFL for tasks assessed           Lower Extremity Assessment: Overall WFL for tasks assessed;RLE deficits/detail RLE Deficits / Details: Pt has pain with hip flexion and adduction, reports from the knee down he is still very swollen       Communication   Communication: No difficulties  Cognition Arousal/Alertness: Awake/alert Behavior During Therapy: WFL for tasks assessed/performed Overall Cognitive Status: Within Functional Limits for  tasks assessed                      General Comments      Exercises        Assessment/Plan    PT Assessment Patient needs continued PT services  PT Diagnosis Acute pain;Generalized weakness;Difficulty walking   PT Problem List Decreased strength;Decreased activity tolerance;Decreased balance;Decreased mobility;Decreased knowledge of use of DME;Decreased safety awareness;Pain  PT Treatment Interventions Gait training;Functional mobility training;Therapeutic activities;Therapeutic exercise;Balance training;Neuromuscular re-education   PT Goals (Current goals can be found in the Care Plan section) Acute Rehab PT Goals Patient Stated Goal: "I just want to get this leg swelling figured out." PT Goal Formulation: With patient Time For Goal Achievement: 11/07/15 Potential to  Achieve Goals: Good    Frequency Min 2X/week   Barriers to discharge        Co-evaluation               End of Session Equipment Utilized During Treatment: Gait belt Activity Tolerance: Patient limited by fatigue;Patient limited by pain Patient left: in chair           Time: [id number] PT Time Calculation (min) (ACUTE ONLY): 17 min   Charges:   PT Evaluation $PT Eval Low Complexity: 1 Procedure     PT G Codes:       Wayne Both, PT, DPT [phone]  Kreg Shropshire 10/31/2015, 3:46 PM

## 2015-10-31 NOTE — Care Management Important Message (Signed)
Important Message  Patient Details  Name: Ronald Miller. MRN: [id number] Date of Birth: 1956-01-12   Medicare Important Message Given:  Yes    Juliann Pulse A [facility] 10/31/2015, 10:54 AM

## 2015-10-31 NOTE — Progress Notes (Signed)
Alert and oriented. Complains of foot pain due to ulceration on top of left foot. Wound cleansed and dressing changed today. Patient has slept most of the day. IV lasix drip continued per order and was just stopped. Patient has had good output. States he does not feel any different yet as far as the fluid in his abdomen and legs. Vitals are stable.

## 2015-11-01 LAB — PROTEIN S, TOTAL: Protein S Ag, Total: 83 % (ref 60–150)

## 2015-11-01 LAB — BASIC METABOLIC PANEL
Anion gap: 8 (ref 5–15)
BUN: 19 mg/dL (ref 6–20)
CALCIUM: 8.1 mg/dL — AB (ref 8.9–10.3)
CO2: 32 mmol/L (ref 22–32)
Chloride: 98 mmol/L — ABNORMAL LOW (ref 101–111)
Creatinine, Ser: 0.93 mg/dL (ref 0.61–1.24)
GFR calc Af Amer: >60 mL/min (ref >60)
GLUCOSE: 181 mg/dL — AB (ref 65–99)
Potassium: 3.8 mmol/L (ref 3.5–5.1)
Sodium: 138 mmol/L (ref 135–145)

## 2015-11-01 LAB — GLUCOSE, CAPILLARY
GLUCOSE-CAPILLARY: 130 mg/dL — AB (ref 65–99)
GLUCOSE-CAPILLARY: 176 mg/dL — AB (ref 65–99)
Glucose-Capillary: 195 mg/dL — ABNORMAL HIGH (ref 65–99)
Glucose-Capillary: 190 mg/dL — ABNORMAL HIGH (ref 65–99)

## 2015-11-01 MED ORDER — FUROSEMIDE 10 MG/ML IJ SOLN
10.0000 mg/h | INTRAVENOUS | Status: AC
  Administered 2015-11-01: 10 mg/h via INTRAVENOUS
  Filled 2015-11-01: qty 25

## 2015-11-01 NOTE — Progress Notes (Signed)
Patient ID: Ronald Miller., male   DOB: 14-Jul-1956, 60 y.o.   MRN: [id number] [facility] PROGRESS NOTE  Erma Frierson. [id number] DOB: 07-30-1956 DOA: 10/27/2015 PCP: Pcp Not In System  HPI/Subjective: Patient was placed on IV Lasix yesterday and had good amount of urine output. Continues to complain of swelling of his thighs and abdomen. Otherwise denies any chest pain   Objective: Filed Vitals:   10/31/15 1943 11/01/15 0516  BP: 108/76 107/78  Pulse: 84 88  Temp: 97.7 F (36.5 C) 98 F (36.7 C)  Resp: 22 22    Filed Weights   10/30/15 0528 10/31/15 0404 11/01/15 0516  Weight: 121.519 kg (267 lb 14.4 oz) 119.115 kg (262 lb 9.6 oz) 117.028 kg (258 lb)    ROS: Review of Systems  Constitutional: Negative for fever and chills.  Eyes: Negative for blurred vision.  Respiratory: Positive for shortness of breath. Negative for cough.   Cardiovascular: Negative for chest pain.  Gastrointestinal: Positive for nausea and resolved abdominal pain. Negative for vomiting, diarrhea and constipation.  Genitourinary: Negative for dysuria.  Musculoskeletal: Negative for joint pain.  Neurological: Negative for dizziness and headaches.   Exam: Physical Exam  Constitutional: He is oriented to person, place, and time.  HENT:  Nose: No mucosal edema.  Mouth/Throat: No oropharyngeal exudate or posterior oropharyngeal edema.  Eyes: Conjunctivae, EOM and lids are normal. Pupils are equal, round, and reactive to light.  Neck: No JVD present. Carotid bruit is not present. No edema present. No thyroid mass and no thyromegaly present.  Cardiovascular: S1 normal and S2 normal.  Exam reveals no gallop.   No murmur heard. Pulses:      Dorsalis pedis pulses are 0 on the right side, and 0 on the left side.  Respiratory: No respiratory distress. He has decreased breath sounds in the right lower field and the left lower field. He has no wheezes. He has no rhonchi. He has no rales.   GI: Soft. Bowel sounds are normal. There is no tenderness.  abdomen distended  Musculoskeletal:       Right ankle: He exhibits swelling.       Left ankle: He exhibits swelling.  Lymphadenopathy:    He has no cervical adenopathy.  Neurological: He is alert and oriented to person, place, and time. No cranial nerve deficit.  Skin: Skin is warm. No rash noted. Nails show no clubbing.  Psychiatric: He has a normal mood and affect.      Data Reviewed: Basic Metabolic Panel:  Recent Labs Lab 10/27/15 1123 10/28/15 0444 10/29/15 0448 10/30/15 0429  NA 142 139 133* 137  K 4.2 3.5 4.2 4.5  CL 102 102 98* 99*  CO2 30 31 29  35*  GLUCOSE 141* 187* 78 172*  BUN 31* 28* 33* 34*  CREATININE 1.47* 1.21 1.33* 1.38*  CALCIUM 8.7* 8.5* 8.0* 8.6*   CBC:  Recent Labs Lab 10/27/15 1123 10/28/15 0444  WBC 7.7 5.4  HGB 12.6* 11.9*  HCT 38.4* 37.4*  MCV 91.7 92.1  PLT 149* 140*    BNP (last 3 results)  Recent Labs  09/30/15 1056 10/13/15 1942 10/27/15 1123  BNP 2375.0* 2776.0* 1686.0*    CBG:  Recent Labs Lab 10/31/15 0746 10/31/15 1147 10/31/15 1703 10/31/15 2106 11/01/15 0743  GLUCAP 136* 176* 143* 201* 130*    No results found for this or any previous visit (from the past 240 hour(s)).   Studies: US Abdomen Limited  10/30/2015  CLINICAL  DATA:  Generalized abdominal pain for 2 months evaluate for ascites in the lower abdomen, ascites suggested on CT thorax, abdominal distention EXAM: LIMITED ABDOMEN ULTRASOUND FOR ASCITES TECHNIQUE: Limited ultrasound survey for ascites was performed in all four abdominal quadrants. COMPARISON:  10/29/2015 CT thorax FINDINGS: A small to moderate volume of ascites is seen throughout the entire abdomen. IMPRESSION: Ascites present. Electronically Signed   By: Skipper Cliche M.D.   On: 10/30/2015 10:22   US Paracentesis  10/31/2015  CLINICAL DATA:  CHF, abdominal ascites, distention and discomfort. EXAM: ULTRASOUND GUIDED PARACENTESIS  COMPARISON:  10/30/2015 PROCEDURE: An ultrasound guided paracentesis was thoroughly discussed with the patient and questions answered. The benefits, risks, alternatives and complications were also discussed. The patient understands and wishes to proceed with the procedure. Written consent was obtained. Ultrasound was performed to localize and mark an adequate pocket of fluid in the right lower quadrant of the abdomen. The area was then prepped and draped in the normal sterile fashion. 1% Lidocaine was used for local anesthesia. Under ultrasound guidance a safety centesis needle catheter was introduced. Paracentesis was performed. The catheter was removed and a dressing applied. COMPLICATIONS: None immediate FINDINGS: A total of approximately 3 L of clear peritoneal fluid was removed. A fluid sample was sent for laboratory analysis. IMPRESSION: Successful ultrasound guided paracentesis yielding 3 L of ascites. Electronically Signed   By: Jerilynn Mages.  Shick M.D.   On: 10/31/2015 08:12    Scheduled Meds: . aspirin EC  81 mg Oral Daily  . atorvastatin  20 mg Oral QPM  . digoxin  0.25 mg Oral Daily  . escitalopram  20 mg Oral Daily  . insulin aspart  0-15 Units Subcutaneous TID WC  . insulin glargine  30 Units Subcutaneous QHS  . lactulose  30 g Oral BID  . polyethylene glycol  17 g Oral Daily  . potassium chloride  20 mEq Oral Daily    Assessment/Plan:  1. Acute on chronic systolic congestive heart failure with cardiomyopathy and lower extremity anasarca. Resume oral Lasix  2. Abdominal distention due to ascites status post drainage due to acute on chronic systolic CHF 3. Hypotension now resolved 4. Cellulitis of the right lower extremity with ulcer on the foot.  Keflex. Appreciate ID input 5. Anxiety depression continue on Lexapro. 6. Diabetes type 2 on glargine insulin and sliding scale. Continue current therapy blood glucose stable 7. Acute renal insufficiency- renal function appears to be stable on  Lasix drip stable 8. Hypokalemia replaced 9. Hyperlipidemia unspecified on atorvastatin 10. Lymphadenopathy in the chest on previous ct chest- unclear etiology- oncology consult appreciated in follow-up with oncology and repeat imaging in the near future   Code Status:     Code Status Orders        Start     Ordered   10/27/15 1915  Full code   Continuous     10/27/15 1917    Code Status History    Date Active Date Inactive Code Status Order ID Comments User Context   10/14/2015  1:39 AM 10/21/2015  8:57 PM Full Code [id number]  Saundra Shelling, MD Inpatient   08/28/2015  2:40 AM 09/01/2015  8:01 PM Full Code [id number]  Lance Coon, MD Inpatient     Family Communication: Mother yesterday Disposition Plan: TBD  Antibiotics:  Keflex  Time spent: 31minutes  Olie Scaffidi, [facility]

## 2015-11-01 NOTE — Progress Notes (Signed)
Per MD Ulysees Barns it is ok that keflex order has expired as of midnight last night.  Pt no longer has ABX ordered

## 2015-11-02 LAB — CBC
HEMATOCRIT: 35.5 % — AB (ref 40.0–52.0)
Hemoglobin: 11.5 g/dL — ABNORMAL LOW (ref 13.0–18.0)
MCH: 29.5 pg (ref 26.0–34.0)
MCHC: 32.5 g/dL (ref 32.0–36.0)
MCV: 90.6 fL (ref 80.0–100.0)
PLATELETS: 153 10*3/uL (ref 150–440)
RBC: 3.92 MIL/uL — ABNORMAL LOW (ref 4.40–5.90)
RDW: 15.8 % — AB (ref 11.5–14.5)
WBC: 6.2 10*3/uL (ref 3.8–10.6)

## 2015-11-02 LAB — GLUCOSE, CAPILLARY
GLUCOSE-CAPILLARY: 206 mg/dL — AB (ref 65–99)
GLUCOSE-CAPILLARY: 164 mg/dL — AB (ref 65–99)
Glucose-Capillary: 117 mg/dL — ABNORMAL HIGH (ref 65–99)
Glucose-Capillary: 169 mg/dL — ABNORMAL HIGH (ref 65–99)

## 2015-11-02 LAB — BASIC METABOLIC PANEL
ANION GAP: 6 (ref 5–15)
BUN: 18 mg/dL (ref 6–20)
CALCIUM: 7.8 mg/dL — AB (ref 8.9–10.3)
CO2: 34 mmol/L — AB (ref 22–32)
Chloride: 97 mmol/L — ABNORMAL LOW (ref 101–111)
Creatinine, Ser: 0.9 mg/dL (ref 0.61–1.24)
GFR calc Af Amer: >60 mL/min (ref >60)
GLUCOSE: 221 mg/dL — AB (ref 65–99)
POTASSIUM: 3.2 mmol/L — AB (ref 3.5–5.1)
Sodium: 137 mmol/L (ref 135–145)

## 2015-11-02 MED ORDER — POTASSIUM CHLORIDE CRYS ER 20 MEQ PO TBCR
40.0000 meq | EXTENDED_RELEASE_TABLET | Freq: Once | ORAL | Status: AC
  Administered 2015-11-02: 40 meq via ORAL
  Filled 2015-11-02: qty 2

## 2015-11-02 MED ORDER — ACETAMINOPHEN 325 MG PO TABS
650.0000 mg | ORAL_TABLET | Freq: Four times a day (QID) | ORAL | Status: DC | PRN
  Administered 2015-11-02: 650 mg via ORAL

## 2015-11-02 MED ORDER — POLYVINYL ALCOHOL 1.4 % OP SOLN
1.0000 [drp] | OPHTHALMIC | Status: DC | PRN
  Administered 2015-11-02: 1 [drp] via OPHTHALMIC
  Filled 2015-11-02: qty 15

## 2015-11-02 MED ORDER — FUROSEMIDE 10 MG/ML IJ SOLN
10.0000 mg/h | INTRAVENOUS | Status: AC
  Administered 2015-11-02: 10 mg/h via INTRAVENOUS
  Filled 2015-11-02: qty 25

## 2015-11-02 NOTE — Progress Notes (Signed)
Lasix drip restarted again this AM. Will discontinue on night shift. Only complaint has been of foot pain that is chronic. Patient did feel dizzy for a little while today, BP stable. Explained to patient that this can be normal with all of the fluid that has been pulled off. Instructed to call for assistance if he continues to feel dizzy when standing. Good urine output. Will continue to monitor.

## 2015-11-02 NOTE — Progress Notes (Signed)
Patient ID: Ronald Arciero., male   DOB: 27-Jun-1956, 60 y.o.   MRN: [id number] [facility] PROGRESS NOTE  Ronald Coulon. [id number] DOB: [birth year] DOA: 10/27/2015 PCP: Pcp Not In System  HPI/Subjective:  Patient has 8 L of urine output since yesterday. His states that abdominal distention is much improved.   Objective: Filed Vitals:   11/01/15 2014 11/02/15 0506  BP: 110/76 105/69  Pulse: 87 92  Temp: 98 F (36.7 C) 98.2 F (36.8 C)  Resp: 16 16    Filed Weights   10/31/15 0404 11/01/15 0516 11/02/15 0506  Weight: 119.115 kg (262 lb 9.6 oz) 117.028 kg (258 lb) 114.715 kg (252 lb 14.4 oz)    ROS: Review of Systems  Constitutional: Negative for fever and chills.  Eyes: Negative for blurred vision.  Respiratory: Positive for shortness of breath. Negative for cough.   Cardiovascular: Negative for chest pain.  Gastrointestinal: Positive for nausea and resolved abdominal pain. Negative for vomiting, diarrhea and constipation.  Genitourinary: Negative for dysuria.  Musculoskeletal: Negative for joint pain.  Neurological: Negative for dizziness and headaches.   Exam: Physical Exam  Constitutional: He is oriented to person, place, and time.  HENT:  Nose: No mucosal edema.  Mouth/Throat: No oropharyngeal exudate or posterior oropharyngeal edema.  Eyes: Conjunctivae, EOM and lids are normal. Pupils are equal, round, and reactive to light.  Neck: No JVD present. Carotid bruit is not present. No edema present. No thyroid mass and no thyromegaly present.  Cardiovascular: S1 normal and S2 normal.  Exam reveals no gallop.   No murmur heard. Pulses:      Dorsalis pedis pulses are 0 on the right side, and 0 on the left side.  Respiratory: No respiratory distress. He has decreased breath sounds in the right lower field and the left lower field. He has no wheezes. He has no rhonchi. He has no rales.  GI: Soft. Bowel sounds are normal. There is no tenderness.   abdomen distended  Musculoskeletal:       Right ankle: He exhibits swelling.       Left ankle: He exhibits swelling.  Lymphadenopathy:    He has no cervical adenopathy.  Neurological: He is alert and oriented to person, place, and time. No cranial nerve deficit.  Skin: Skin is warm. No rash noted. Nails show no clubbing.  Psychiatric: He has a normal mood and affect.      Data Reviewed: Basic Metabolic Panel:  Recent Labs Lab 10/28/15 0444 10/29/15 0448 10/30/15 0429 11/01/15 0904 11/02/15 0520  NA 139 133* 137 138 137  K 3.5 4.2 4.5 3.8 3.2*  CL 102 98* 99* 98* 97*  CO2 31 29 35* 32 34*  GLUCOSE 187* 78 172* 181* 221*  BUN 28* 33* 34* 19 18  CREATININE 1.21 1.33* 1.38* 0.93 0.90  CALCIUM 8.5* 8.0* 8.6* 8.1* 7.8*   CBC:  Recent Labs Lab 10/27/15 1123 10/28/15 0444 11/02/15 0520  WBC 7.7 5.4 6.2  HGB 12.6* 11.9* 11.5*  HCT 38.4* 37.4* 35.5*  MCV 91.7 92.1 90.6  PLT 149* 140* 153    BNP (last 3 results)  Recent Labs  09/30/15 1056 10/13/15 1942 10/27/15 1123  BNP 2375.0* 2776.0* 1686.0*    CBG:  Recent Labs Lab 11/01/15 0743 11/01/15 1142 11/01/15 1611 11/01/15 2016 11/02/15 0818  GLUCAP 130* 195* 190* 176* 117*    Recent Results (from the past 240 hour(s))  Wound culture     Status: None (Preliminary result)  Collection Time: 10/31/15  3:15 PM  Result Value Ref Range Status   Specimen Description RF  Final   Special Requests Normal  Final   Gram Stain FEW WBC SEEN FEW GRAM POSITIVE COCCI   Final   Culture   Final    MODERATE GROWTH COAGULASE NEGATIVE STAPHYLOCOCCUS CALL MICROBIOLOGY LAB IF SENSITIVITIES ARE REQUIRED.    Report Status PENDING  Incomplete     Studies: No results found.  Scheduled Meds: . aspirin EC  81 mg Oral Daily  . atorvastatin  20 mg Oral QPM  . digoxin  0.25 mg Oral Daily  . escitalopram  20 mg Oral Daily  . insulin aspart  0-15 Units Subcutaneous TID WC  . insulin glargine  30 Units Subcutaneous QHS  .  lactulose  30 g Oral BID  . polyethylene glycol  17 g Oral Daily  . potassium chloride  20 mEq Oral Daily  . potassium chloride  40 mEq Oral Once    Assessment/Plan:  1. Acute on chronic systolic congestive heart failure with cardiomyopathy and lower extremity anasarca.  I will do 1 more day of IV Lasix drip is responded well to that 2. Abdominal distention due to ascites status post drainage due to acute on chronic systolic CHF 3. Hypotension now resolved 4. Cellulitis of the right lower extremity with ulcer on the foot. Status post therapy with  Keflex. Appreciate ID input 5. Anxiety depression continue on Lexapro. 6. Diabetes type 2 on glargine insulin and sliding scale. Continue current therapy blood glucose stable 7. Acute renal insufficiency- renal function appears to be stable on Lasix drip stable 8. Hypokalemia replaced 9. Hyperlipidemia unspecified on atorvastatin 10. Lymphadenopathy in the chest on previous ct chest- unclear etiology- oncology consult appreciated in follow-up with oncology and repeat imaging in the near future   Code Status:     Code Status Orders        Start     Ordered   10/27/15 1915  Full code   Continuous     10/27/15 1917    Code Status History    Date Active Date Inactive Code Status Order ID Comments User Context   10/14/2015  1:39 AM 10/21/2015  8:57 PM Full Code [id number]  Saundra Shelling, MD Inpatient   08/28/2015  2:40 AM 09/01/2015  8:01 PM Full Code [id number]  Lance Coon, MD Inpatient     Family Communication: Mother yesterday Disposition Plan: TBD  Antibiotics:  Keflex  Time spent: 27minutes  Ronald Miller, [facility]

## 2015-11-03 LAB — BASIC METABOLIC PANEL
Anion gap: 3 — ABNORMAL LOW (ref 5–15)
BUN: 19 mg/dL (ref 6–20)
CALCIUM: 8.3 mg/dL — AB (ref 8.9–10.3)
CO2: 37 mmol/L — ABNORMAL HIGH (ref 22–32)
Chloride: 99 mmol/L — ABNORMAL LOW (ref 101–111)
Creatinine, Ser: 0.95 mg/dL (ref 0.61–1.24)
GFR calc Af Amer: >60 mL/min (ref >60)
Glucose, Bld: 156 mg/dL — ABNORMAL HIGH (ref 65–99)
POTASSIUM: 3.5 mmol/L (ref 3.5–5.1)
SODIUM: 139 mmol/L (ref 135–145)

## 2015-11-03 LAB — GLUCOSE, CAPILLARY
GLUCOSE-CAPILLARY: 112 mg/dL — AB (ref 65–99)
Glucose-Capillary: 212 mg/dL — ABNORMAL HIGH (ref 65–99)

## 2015-11-03 LAB — CYTOLOGY - NON PAP

## 2015-11-03 MED ORDER — METOLAZONE 5 MG PO TABS: 5.0000 mg | ORAL_TABLET | Freq: Every day | ORAL | Status: DC

## 2015-11-03 MED ORDER — FUROSEMIDE 40 MG PO TABS: 40.0000 mg | ORAL_TABLET | Freq: Two times a day (BID) | ORAL | Status: DC

## 2015-11-03 MED ORDER — DIGOXIN 250 MCG PO TABS: 0.2500 mg | ORAL_TABLET | Freq: Every day | ORAL | Status: DC

## 2015-11-03 MED ORDER — OXYCODONE-ACETAMINOPHEN 7.5-325 MG PO TABS: 1.0000 | ORAL_TABLET | Freq: Three times a day (TID) | ORAL | Status: DC | PRN

## 2015-11-03 MED ORDER — POLYETHYLENE GLYCOL 3350 17 G PO PACK: 17.0000 g | PACK | Freq: Every day | ORAL | Status: DC

## 2015-11-03 NOTE — Progress Notes (Signed)
Pt discharged to home via wc.  Instructions and rx given to pt.  Questions answered.  No distress.  

## 2015-11-03 NOTE — Care Management (Addendum)
Patient for discharge home today.  He is in agreement with home health nursing, physical therapy and social work.  Agency preference is [facility] ([facility]  Confirmed address and phone numbers.  He has appointment with heart failure clinic.  Referral called to Abby with [facility].  Agency will be able to receive the orders through [place] [facility].  Provided patient with some scales

## 2015-11-03 NOTE — Care Management Important Message (Signed)
Important Message  Patient Details  Name: Ronald Miller. MRN: [id number] Date of Birth: [birth year]   Medicare Important Message Given:  Yes    Juliann Pulse A Mckensie Scotti 11/03/2015, 9:39 AM

## 2015-11-03 NOTE — Discharge Summary (Signed)
Ronald Sivers., 60 y.o., DOB [birth year], MRN [id number]. Admission date: 10/27/2015 Discharge Date 11/03/2015 Primary [facility] Admitting Physician Epifanio Lesches, MD  Admission Diagnosis  Acute renal insufficiency [N28.9] Decreased exercise tolerance [R68.89] Acute on chronic systolic congestive heart failure [facility]) [I50.23]  Discharge Diagnosis   Active Problems:   Acute on chronic systolic CHF (congestive heart failure) (HCC)  ascites secondary to CHF Hypotension related to #1 Cellulitis of the right lower extremity with ulcer now improved Anxiety Diabetes type 2 Acute renal insufficiency due to acute CHF now resolved Hypokalemia Hyperlipidemia Chronic  lymphadenopathy in the chest was seen by him oncology recommended monitoring      Hospital Course Ronald Miller is a 60 y.o. male with a known history of diabetes mellitus, hypertension, chronic systolic heart failure and was recently discharged on 17th of January comes back because of shortness of breath, orthopnea, increased abdominal wall edema, pedal edema. Patient feels dyspnea on exertion. Patient presented with these symptoms. Initially was given Lasix but then his blood pressure dropped. Her Lasix was held. He continued to complain of abdominal distention and so he had ultrasound of abdomen which showed ascites he underwent paracentesis would continue to have swelling of his lower extremity shortness of breath therefore he was started on IV Lasix drip. With the Lasix drip patient put out significant amount of fluid. Close to 18 L since admission. His breathing and his swelling is much improved now.  Patient also had CT scan of the chest which showed lymphadenopathy which he previously had he was seen by oncology they recommended monitoring this. If lymphadenopathy got worst they recommended pulmonary evaluation with biopsy.  Lower extremity cellulitis E was seen by Dr. Ola Spurr who recommended  anabiotic's. He finished a course of antibiotics in the hospital he will need local care for his ulcer.2           Consults  cardiology, infectious disease, oncology  Significant Tests:  See full reports for all details    Dg Chest 2 View  10/27/2015  CLINICAL DATA:  Shortness of breath, worsening the past 2 days. Chest tightness. 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 Prior CABG. Cardiomegaly. No confluent opacities, effusions or edema. No acute bony abnormality. IMPRESSION: Cardiomegaly.  No active disease. Electronically Signed   By: Rolm Baptise M.D.   On: 10/27/2015 11:47   Dg Chest 2 View  10/13/2015  CLINICAL DATA:  Bilateral lower extremity edema for 2 days. History of congestive heart failure, hypertension and COPD. EXAM: CHEST  2 VIEW COMPARISON:  09/30/2015 radiographs.  CT 08/29/2015. FINDINGS: The left subclavian AICD leads appear unchanged within the right atrium, right ventricle and coronary sinus. There is stable cardiomegaly status post CABG. Chronic vascular congestion is similar to the prior study. There is no overt pulmonary edema, confluent airspace opacity or pleural effusion. Mild thoracic spine degenerative changes are stable. IMPRESSION: Stable cardiomegaly and chronic vascular congestion. No overt pulmonary edema. Electronically Signed   By: Richardean Sale M.D.   On: 10/13/2015 19:02   Ct Chest Wo Contrast  10/29/2015  CLINICAL DATA:  60 year old male with history of diabetes hypertension chronic systolic heart failure. Short of breath and abdominal wall edema. EXAM: CT CHEST WITHOUT CONTRAST TECHNIQUE: Multidetector CT imaging of the chest was performed following the standard protocol without IV contrast. COMPARISON:  10/27/2015, CT 11/25/ 16 FINDINGS: Mediastinum/Nodes: Pacemaker in LEFT chest wall. No axillary or supraclavicular lymphadenopathy. There  are multiple small mediastinal lymph nodes not changed from  prior. Heart is enlarged. Esophagus normal. Lungs/Pleura: There is no pulmonary edema or interstitial edema. No pleural fluid. Airways are normal. Upper abdomen: Moderate volume of fluid surrounding the liver and spleen. Postcholecystectomy. Adrenal glands are normal. Musculoskeletal: No aggressive osseous lesion. IMPRESSION: 1. Cardiomegaly and mediastinal adenopathy not changed from prior. Adenopathy is likely reactive. 2. No evidence of pulmonary edema or pleural effusions. 3. Upper abdominal fluid likely relates to heart failure or ascites. Electronically Signed   By: Suzy Bouchard M.D.   On: 10/29/2015 15:59   US Abdomen Limited  10/30/2015  CLINICAL DATA:  Generalized abdominal pain for 2 months evaluate for ascites in the lower abdomen, ascites suggested on CT thorax, abdominal distention EXAM: LIMITED ABDOMEN ULTRASOUND FOR ASCITES TECHNIQUE: Limited ultrasound survey for ascites was performed in all four abdominal quadrants. COMPARISON:  10/29/2015 CT thorax FINDINGS: A small to moderate volume of ascites is seen throughout the entire abdomen. IMPRESSION: Ascites present. Electronically Signed   By: Skipper Cliche M.D.   On: 10/30/2015 10:22   US Venous Img Lower Unilateral Right  10/19/2015  CLINICAL DATA:  Cellulitis with worsening pain in the right lower extremity for 2 weeks. EXAM: RIGHT LOWER EXTREMITY VENOUS DOPPLER ULTRASOUND TECHNIQUE: Gray-scale sonography with graded compression, as well as color Doppler and duplex ultrasound were performed to evaluate the lower extremity deep venous systems from the level of the common femoral vein and including the common femoral, femoral, profunda femoral, popliteal and calf veins including the posterior tibial, peroneal and gastrocnemius veins when visible. The superficial great saphenous vein was also interrogated. Spectral Doppler was utilized to evaluate flow at rest and with distal augmentation maneuvers in the common femoral, femoral and  popliteal veins. COMPARISON:  10/13/2015 right lower extremity venous Doppler scan. FINDINGS: Contralateral Common Femoral Vein: Respiratory phasicity is normal and symmetric with the symptomatic side. No evidence of thrombus. Normal compressibility. Common Femoral Vein: No evidence of thrombus. Normal compressibility, respiratory phasicity and response to augmentation. Saphenofemoral Junction: No evidence of thrombus. Normal compressibility and flow on color Doppler imaging. Profunda Femoral Vein: No evidence of thrombus. Normal compressibility and flow on color Doppler imaging. Femoral Vein: No evidence of thrombus. Normal compressibility, respiratory phasicity and response to augmentation. Popliteal Vein: No evidence of thrombus. Normal compressibility, respiratory phasicity and response to augmentation. Calf Veins: No evidence of thrombus. Normal compressibility and flow on color Doppler imaging. Superficial Great Saphenous Vein: No evidence of thrombus. Normal compressibility and flow on color Doppler imaging. Venous Reflux:  None. Other Findings: Prominent superficial edema throughout the right lower extremity. IMPRESSION: No evidence of deep venous thrombosis in the right lower extremity. Electronically Signed   By: Ilona Sorrel M.D.   On: 10/19/2015 17:01   [place] Extrem Low Right Comp  10/14/2015  CLINICAL DATA:  60 year old male with right leg pain and swelling EXAM: Right LOWER EXTREMITY VENOUS DOPPLER ULTRASOUND TECHNIQUE: Gray-scale sonography with graded compression, as well as color Doppler and duplex ultrasound were performed to evaluate the lower extremity deep venous systems from the level of the common femoral vein and including the common femoral, femoral, profunda femoral, popliteal and calf veins including the posterior tibial, peroneal and gastrocnemius veins when visible. The superficial great saphenous vein was also interrogated. Spectral Doppler was utilized to evaluate flow at rest and with  distal augmentation maneuvers in the common femoral, femoral and popliteal veins. COMPARISON:  Ultrasound dated 02/08/2005 FINDINGS: Contralateral Common Femoral Vein: Respiratory  phasicity is normal and symmetric with the symptomatic side. No evidence of thrombus. Normal compressibility. Common Femoral Vein: No evidence of thrombus. Normal compressibility, respiratory phasicity and response to augmentation. Saphenofemoral Junction: No evidence of thrombus. Normal compressibility and flow on color Doppler imaging. Profunda Femoral Vein: No evidence of thrombus. Normal compressibility and flow on color Doppler imaging. Femoral Vein: No evidence of thrombus. Normal compressibility, respiratory phasicity and response to augmentation. Popliteal Vein: No evidence of thrombus. Normal compressibility, respiratory phasicity and response to augmentation. Calf Veins: No evidence of thrombus. Normal compressibility and flow on color Doppler imaging. Superficial Great Saphenous Vein: No evidence of thrombus. Normal compressibility and flow on color Doppler imaging. IMPRESSION: No evidence of deep venous thrombosis in the right lower extremity. Electronically Signed   By: Anner Crete M.D.   On: 10/14/2015 01:00   US Paracentesis  10/31/2015  CLINICAL DATA:  CHF, abdominal ascites, distention and discomfort. EXAM: ULTRASOUND GUIDED PARACENTESIS COMPARISON:  10/30/2015 PROCEDURE: An ultrasound guided paracentesis was thoroughly discussed with the patient and questions answered. The benefits, risks, alternatives and complications were also discussed. The patient understands and wishes to proceed with the procedure. Written consent was obtained. Ultrasound was performed to localize and mark an adequate pocket of fluid in the right lower quadrant of the abdomen. The area was then prepped and draped in the normal sterile fashion. 1% Lidocaine was used for local anesthesia. Under ultrasound guidance a safety centesis needle  catheter was introduced. Paracentesis was performed. The catheter was removed and a dressing applied. COMPLICATIONS: None immediate FINDINGS: A total of approximately 3 L of clear peritoneal fluid was removed. A fluid sample was sent for laboratory analysis. IMPRESSION: Successful ultrasound guided paracentesis yielding 3 L of ascites. Electronically Signed   By: Jerilynn Mages.  Shick M.D.   On: 10/31/2015 08:12   US Abdomen Limited Ruq  10/29/2015  CLINICAL DATA:  Abdominal distension, generalized abdominal pain EXAM: US ABDOMEN LIMITED - RIGHT UPPER QUADRANT COMPARISON:  08/30/2015 FINDINGS: Gallbladder: Surgically absent Common bile duct: Diameter: 5.3 mm in diameter probable postcholecystectomy Liver: No focal lesion identified. Mild increased echogenicity of the liver suspicious for fatty infiltration. IMPRESSION: 1. Status postcholecystectomy. Normal CBD. Mild increased echogenicity of the liver suspicious for fatty infiltration. Electronically Signed   By: Lahoma Crocker M.D.   On: 10/29/2015 08:44       Today   Subjective:   Ronald Miller  Feels well denies any cp or sob  Objective:   Blood pressure 104/67, pulse 97, temperature 98.4 F (36.9 C), temperature source Oral, resp. rate 18, height 5\' 9"  (1.753 m), weight 113.807 kg (250 lb 14.4 oz), SpO2 92 %.  .  Intake/Output Summary (Last 24 hours) at 11/03/15 1427 Last data filed at 11/03/15 1353  Gross per 24 hour  Intake  309.5 ml  Output   3500 ml  Net -3190.5 ml    Exam VITAL SIGNS: Blood pressure 104/67, pulse 97, temperature 98.4 F (36.9 C), temperature source Oral, resp. rate 18, height 5\' 9"  (1.753 m), weight 113.807 kg (250 lb 14.4 oz), SpO2 92 %.  GENERAL:  60 y.o.-year-old patient lying in the bed with no acute distress.  EYES: Pupils equal, round, reactive to light and accommodation. No scleral icterus. Extraocular muscles intact.  HEENT: Head atraumatic, normocephalic. Oropharynx and nasopharynx clear.  NECK:  Supple, no jugular  venous distention. No thyroid enlargement, no tenderness.  LUNGS: Normal breath sounds bilaterally, no wheezing, rales,rhonchi or crepitation. No use of accessory muscles of  respiration.  CARDIOVASCULAR: S1, S2 normal. No murmurs, rubs, or gallops.  ABDOMEN: Soft, nontender, nondistended. Bowel sounds present. No organomegaly or mass.  EXTREMITIES: No pedal edema, cyanosis, or clubbing.  NEUROLOGIC: Cranial nerves II through XII are intact. Muscle strength 5/5 in all extremities. Sensation intact. Gait not checked.  PSYCHIATRIC: The patient is alert and oriented x 3.  SKIN: No obvious rash, lesion, or ulcer.   Data Review     CBC w Diff: Lab Results  Component Value Date   WBC 6.2 11/02/2015   WBC 9.6 04/09/2014   HGB 11.5* 11/02/2015   HGB 13.6 04/09/2014   HCT 35.5* 11/02/2015   HCT 41.2 04/09/2014   PLT 153 11/02/2015   PLT 162 04/09/2014   LYMPHOPCT 15 10/20/2015   LYMPHOPCT 25.4 03/30/2014   MONOPCT 8 10/20/2015   MONOPCT 6.7 03/30/2014   EOSPCT 2 10/20/2015   EOSPCT 1.7 03/30/2014   BASOPCT 1 10/20/2015   BASOPCT 0.5 03/30/2014   CMP: Lab Results  Component Value Date   NA 139 11/03/2015   NA 136 04/09/2014   K 3.5 11/03/2015   K 3.9 04/09/2014   CL 99* 11/03/2015   CL 100 04/09/2014   CO2 37* 11/03/2015   CO2 32 04/09/2014   BUN 19 11/03/2015   BUN 12 04/09/2014   CREATININE 0.95 11/03/2015   CREATININE 1.07 04/09/2014   PROT 6.3* 10/31/2015   PROT 7.3 03/29/2014   ALBUMIN 3.9 09/30/2015   ALBUMIN 3.9 03/29/2014   BILITOT 0.9 09/30/2015   BILITOT 0.4 03/29/2014   ALKPHOS 72 09/30/2015   ALKPHOS 82 03/29/2014   AST 20 09/30/2015   AST 24 03/29/2014   ALT 27 09/30/2015   ALT 40 03/29/2014  .  Micro Results Recent Results (from the past 240 hour(s))  Body fluid culture     Status: None (Preliminary result)   Collection Time: 10/30/15  4:45 PM  Result Value Ref Range Status   Specimen Description PERITONEAL  Final   Special Requests NONE  Final    Gram Stain PENDING  Incomplete   Culture NO GROWTH 1 DAY  Final   Report Status PENDING  Incomplete  Wound culture     Status: None (Preliminary result)   Collection Time: 10/31/15  3:15 PM  Result Value Ref Range Status   Specimen Description RF  Final   Special Requests Normal  Final   Gram Stain FEW WBC SEEN FEW GRAM POSITIVE COCCI   Final   Culture   Final    MODERATE GROWTH COAGULASE NEGATIVE STAPHYLOCOCCUS CALL MICROBIOLOGY LAB IF SENSITIVITIES ARE REQUIRED.    Report Status PENDING  Incomplete        Code Status Orders        Start     Ordered   10/27/15 1915  Full code   Continuous     10/27/15 1917    Code Status History    Date Active Date Inactive Code Status Order ID Comments User Context   10/14/2015  1:39 AM 10/21/2015  8:57 PM Full Code [id number]  Saundra Shelling, MD Inpatient   08/28/2015  2:40 AM 09/01/2015  8:01 PM Full Code [id number]  Lance Coon, MD Inpatient          Follow-up Information    Follow up with Alisa Graff, FNP. Go on 11/06/2015.   Specialty:  Family Medicine   Why:  at 9:00am , to the [facility]   Contact information:   [address]  [address] [place] [address] [phone]       Follow up with pcp In 7 days.   Why:  Your appointment is Monday, February 20th at 230pm with Elisabeth Cara, NP at [facility], [facility]      Follow up with Corey Skains, MD In 10 days.   Specialty:  Internal Medicine   Why:  Wednesday, February 8th at 315pm, [facility]   Contact information:   [facility] [place] [address] [phone]       Discharge Medications     Medication List    STOP taking these medications        cephALEXin 500 MG capsule  Commonly known as:  KEFLEX     predniSONE 50 MG tablet  Commonly known as:  DELTASONE      TAKE these medications        acetaminophen 500 MG tablet  Commonly known as:  TYLENOL  Take 1,000 mg by mouth every 6 (six)  hours as needed for mild pain or headache.     albuterol 108 (90 Base) MCG/ACT inhaler  Commonly known as:  PROVENTIL HFA;VENTOLIN HFA  Inhale 2 puffs into the lungs every 6 (six) hours as needed for wheezing or shortness of breath.     aspirin EC 81 MG tablet  Take 81 mg by mouth daily.     atorvastatin 20 MG tablet  Commonly known as:  LIPITOR  Take 20 mg by mouth every evening.     baclofen 10 MG tablet  Commonly known as:  LIORESAL  Take 10 mg by mouth 3 (three) times daily as needed for muscle spasms.     benazepril 5 MG tablet  Commonly known as:  LOTENSIN  Take 1 tablet (5 mg total) by mouth daily.     digoxin 0.25 MG tablet  Commonly known as:  LANOXIN  Take 1 tablet (0.25 mg total) by mouth daily.     escitalopram 20 MG tablet  Commonly known as:  LEXAPRO  Take 1 tablet (20 mg total) by mouth daily.     furosemide 40 MG tablet  Commonly known as:  LASIX  Take 1 tablet (40 mg total) by mouth 2 (two) times daily. Pt takes two tablets in the morning and one in the evening.     insulin aspart 100 UNIT/ML injection  Commonly known as:  novoLOG  Inject 10 Units into the skin 3 (three) times daily before meals.     insulin glargine 100 UNIT/ML injection  Commonly known as:  LANTUS  Inject 30 Units into the skin at bedtime.     metFORMIN 1000 MG tablet  Commonly known as:  GLUCOPHAGE  Take 1 tablet (1,000 mg total) by mouth 2 (two) times daily.     metolazone 5 MG tablet  Commonly known as:  ZAROXOLYN  Take 1 tablet (5 mg total) by mouth daily.     metoprolol succinate 100 MG 24 hr tablet  Commonly known as:  TOPROL-XL  Take 100 mg by mouth every evening.     oxyCODONE-acetaminophen 7.5-325 MG tablet  Commonly known as:  PERCOCET  Take 1 tablet by mouth every 8 (eight) hours as needed for severe pain.     polyethylene glycol packet  Commonly known as:  MIRALAX / GLYCOLAX  Take 17 g by mouth daily.     potassium chloride SA 20 MEQ tablet  Commonly known  as:  K-DUR,KLOR-CON  Take 1 tablet (20 mEq total) by  mouth 2 (two) times daily.     zolpidem 12.5 MG CR tablet  Commonly known as:  AMBIEN CR  Take 1 tablet (12.5 mg total) by mouth at bedtime as needed for sleep.           Total Time in preparing paper work, data evaluation and todays exam - 35 minutes  Dustin Flock M.D on 11/03/2015 at 2:27 [facility]  [facility]   Office  [phone]

## 2015-11-03 NOTE — Progress Notes (Signed)
[place] INFECTIOUS DISEASE PROGRESS NOTE Date of Admission:  10/27/2015     ID: Gunnar Fusi. is a 60 y.o. male with  CHF and LE cellulitis  Active Problems:   Acute on chronic systolic CHF (congestive heart failure) (HCC)  Subjective: Diuresing, redness improving, afebrile  ROS  Eleven systems are reviewed and negative except per hpi  Medications:  Antibiotics Given (last 72 hours)    Date/Time Action Medication Dose   10/31/15 2136 Given   cephALEXin (KEFLEX) capsule 500 mg 500 mg     . aspirin EC  81 mg Oral Daily  . atorvastatin  20 mg Oral QPM  . digoxin  0.25 mg Oral Daily  . escitalopram  20 mg Oral Daily  . insulin aspart  0-15 Units Subcutaneous TID WC  . insulin glargine  30 Units Subcutaneous QHS  . lactulose  30 g Oral BID  . polyethylene glycol  17 g Oral Daily  . potassium chloride  20 mEq Oral Daily    Objective: Vital signs in last 24 hours: Temp:  [97.5 F (36.4 C)-98.4 F (36.9 C)] 98.4 F (36.9 C) (01/30 1145) Pulse Rate:  [82-97] 97 (01/30 1145) Resp:  [16-18] 18 (01/30 1145) BP: (94-107)/(67-79) 104/67 mmHg (01/30 1145) SpO2:  [92 %-93 %] 92 % (01/30 1145) Weight:  [113.807 kg (250 lb 14.4 oz)] 113.807 kg (250 lb 14.4 oz) (01/30 0531) Constitutional: He is oriented to person, place, and time. He appears well-developed and well-nourished. No distress.  HENT:  Mouth/Throat: Oropharynx is clear and moist. No oropharyngeal exudate.  Cardiovascular: Normal rate, regular rhythm and normal heart sounds. Exam reveals no gallop and no friction rub.  No murmur heard.  Pulmonary/Chest: Effort normal and breath sounds normal. No respiratory distress. He has no wheezes.  Abdominal: Soft. Bowel sounds are normal. He exhibits no distension. There is no tenderness.  Lymphadenopathy: He has no cervical adenopathy.  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 Has scab ant shin  Dorsum of foot just before toes has shallow area of skin breakdown - dry and almost scabbed over  Psycn nml affect. His behavior is normal.   Lab Results  Recent Labs  11/02/15 0520 11/03/15 0504  WBC 6.2  --   HGB 11.5*  --   HCT 35.5*  --   NA 137 139  K 3.2* 3.5  CL 97* 99*  CO2 34* 37*  BUN 18 19  CREATININE 0.90 0.95    Microbiology: Results for orders placed or performed during the hospital encounter of 10/27/15  Body fluid culture     Status: None (Preliminary result)   Collection Time: 10/30/15  4:45 PM  Result Value Ref Range Status   Specimen Description PERITONEAL  Final   Special Requests NONE  Final   Gram Stain PENDING  Incomplete   Culture NO GROWTH 1 DAY  Final   Report Status PENDING  Incomplete  Wound culture     Status: None (Preliminary result)   Collection Time: 10/31/15  3:15 PM  Result Value Ref Range Status   Specimen Description RF  Final   Special Requests Normal  Final   Gram Stain FEW WBC SEEN FEW GRAM POSITIVE COCCI   Final   Culture   Final    MODERATE GROWTH COAGULASE NEGATIVE STAPHYLOCOCCUS CALL MICROBIOLOGY LAB IF SENSITIVITIES ARE REQUIRED.    Report Status PENDING  Incomplete  Studies/Results: No results found.  Assessment/Plan: Marsalis Giuliani. is a 60 y.o. male with CHF EF 20-25% readmitted with vol overload. At last admission he had RLE cellulitis and a shallow ulcer on dorsum of foot. He responded well to abx and unnaboot. His cellulitis is much improved and his wound is improving. No fevers, nml WBC. Wound cx last admit with Nml flora Wound cx just with coag neg staph  Recommendations Continue diuresis which will help the most Elevate legs on 6 pillows.  Would dc off of any abx and continue local wound care to the healing wound on dorsum of R foot.  If redness recurs on RLE would replace unawrap Thank you very much for the consult. Will follow with you.  [place],  [place]   11/03/2015, 2:14 PM

## 2015-11-03 NOTE — Discharge Instructions (Signed)
[facility] Failure Clinic appointment on November 06, 2015 at 9:00am with Darylene Price, [place]. Please call [phone] to reschedule.    DIET:  Cardiac diet, carobhydrate controlled  DISCHARGE CONDITION:  Stable  ACTIVITY:  Activity as tolerated  OXYGEN:  Home Oxygen: No.   Oxygen Delivery: room air  DISCHARGE LOCATION:  home    ADDITIONAL DISCHARGE INSTRUCTION:   If you experience worsening of your admission symptoms, develop shortness of breath, life threatening emergency, suicidal or homicidal thoughts you must seek medical attention immediately by calling 911 or calling your MD immediately  if symptoms less severe.  You Must read complete instructions/literature along with all the possible adverse reactions/side effects for all the Medicines you take and that have been prescribed to you. Take any new Medicines after you have completely understood and accpet all the possible adverse reactions/side effects.   Please note  You were cared for by a hospitalist during your hospital stay. If you have any questions about your discharge medications or the care you received while you were in the hospital after you are discharged, you can call the unit and asked to speak with the hospitalist on call if the hospitalist that took care of you is not available. Once you are discharged, your primary care physician will handle any further medical issues. Please note that NO REFILLS for any discharge medications will be authorized once you are discharged, as it is imperative that you return to your primary care physician (or establish a relationship with a primary care physician if you do not have one) for your aftercare needs so that they can reassess your need for medications and monitor your lab values.

## 2015-11-03 NOTE — Care Management (Signed)
No response from [name].  Patient next choice is [facility].  Referral for SN PT and SW called to [facility]

## 2015-11-04 LAB — WOUND CULTURE: SPECIAL REQUESTS: NORMAL

## 2015-11-06 ENCOUNTER — Encounter: Payer: Self-pay | Admitting: Family

## 2015-11-06 ENCOUNTER — Telehealth: Payer: Self-pay | Admitting: Family

## 2015-11-06 ENCOUNTER — Ambulatory Visit: Payer: Medicare HMO | Attending: Family | Admitting: Family

## 2015-11-06 VITALS — BP 89/62 | HR 76 | Resp 20 | Ht 69.0 in | Wt 240.0 lb

## 2015-11-06 DIAGNOSIS — Z87891 Personal history of nicotine dependence: Secondary | ICD-10-CM | POA: Diagnosis not present

## 2015-11-06 DIAGNOSIS — Z951 Presence of aortocoronary bypass graft: Secondary | ICD-10-CM | POA: Insufficient documentation

## 2015-11-06 DIAGNOSIS — Z794 Long term (current) use of insulin: Secondary | ICD-10-CM | POA: Insufficient documentation

## 2015-11-06 DIAGNOSIS — Z888 Allergy status to other drugs, medicaments and biological substances status: Secondary | ICD-10-CM | POA: Insufficient documentation

## 2015-11-06 DIAGNOSIS — Z7984 Long term (current) use of oral hypoglycemic drugs: Secondary | ICD-10-CM | POA: Insufficient documentation

## 2015-11-06 DIAGNOSIS — E119 Type 2 diabetes mellitus without complications: Secondary | ICD-10-CM | POA: Insufficient documentation

## 2015-11-06 DIAGNOSIS — I952 Hypotension due to drugs: Secondary | ICD-10-CM

## 2015-11-06 DIAGNOSIS — Z9889 Other specified postprocedural states: Secondary | ICD-10-CM | POA: Diagnosis not present

## 2015-11-06 DIAGNOSIS — J449 Chronic obstructive pulmonary disease, unspecified: Secondary | ICD-10-CM | POA: Insufficient documentation

## 2015-11-06 DIAGNOSIS — Z7982 Long term (current) use of aspirin: Secondary | ICD-10-CM | POA: Diagnosis not present

## 2015-11-06 DIAGNOSIS — Z9049 Acquired absence of other specified parts of digestive tract: Secondary | ICD-10-CM | POA: Insufficient documentation

## 2015-11-06 DIAGNOSIS — I110 Hypertensive heart disease with heart failure: Secondary | ICD-10-CM | POA: Insufficient documentation

## 2015-11-06 DIAGNOSIS — I5022 Chronic systolic (congestive) heart failure: Secondary | ICD-10-CM | POA: Diagnosis not present

## 2015-11-06 DIAGNOSIS — F329 Major depressive disorder, single episode, unspecified: Secondary | ICD-10-CM | POA: Insufficient documentation

## 2015-11-06 DIAGNOSIS — Z79899 Other long term (current) drug therapy: Secondary | ICD-10-CM | POA: Insufficient documentation

## 2015-11-06 DIAGNOSIS — F1410 Cocaine abuse, uncomplicated: Secondary | ICD-10-CM | POA: Insufficient documentation

## 2015-11-06 DIAGNOSIS — I2510 Atherosclerotic heart disease of native coronary artery without angina pectoris: Secondary | ICD-10-CM | POA: Insufficient documentation

## 2015-11-06 DIAGNOSIS — M4320 Fusion of spine, site unspecified: Secondary | ICD-10-CM | POA: Insufficient documentation

## 2015-11-06 LAB — BASIC METABOLIC PANEL
ANION GAP: 10 (ref 5–15)
BUN: 46 mg/dL — AB (ref 6–20)
CHLORIDE: 97 mmol/L — AB (ref 101–111)
CO2: 30 mmol/L (ref 22–32)
Calcium: 9.1 mg/dL (ref 8.9–10.3)
Creatinine, Ser: 1.55 mg/dL — ABNORMAL HIGH (ref 0.61–1.24)
GFR calc Af Amer: 55 mL/min — ABNORMAL LOW (ref >60)
GFR, EST NON AFRICAN AMERICAN: 47 mL/min — AB (ref >60)
GLUCOSE: 130 mg/dL — AB (ref 65–99)
POTASSIUM: 3.3 mmol/L — AB (ref 3.5–5.1)
Sodium: 137 mmol/L (ref 135–145)

## 2015-11-06 LAB — BODY FLUID CULTURE: Culture: NO GROWTH

## 2015-11-06 NOTE — Progress Notes (Signed)
Subjective:    Patient ID: Ronald Miller., male    DOB: [birth year], 60 y.o.   MRN: [id number]  Congestive Heart Failure Presents for follow-up visit. The disease course has been improving. Associated symptoms include edema, fatigue and shortness of breath. Pertinent negatives include no abdominal pain, chest pain, chest pressure, orthopnea or palpitations. The symptoms have been improving. Past treatments include salt and fluid restriction, ACE inhibitors, digoxin and beta blockers. The treatment provided moderate relief. Compliance with prior treatments has been good. His past medical history is significant for CAD, chronic lung disease, DM and HTN. He has one 1st degree relative with heart disease. Compliance with total regimen is 76-100%.  Other This is a recurrent (hypotension) problem. The current episode started 1 to 4 weeks ago. The problem occurs daily. Associated symptoms include fatigue. Pertinent negatives include no abdominal pain, chest pain, congestion, coughing, headaches, nausea, numbness, sore throat or visual change. The symptoms are aggravated by bending. He has tried position changes for the symptoms. The treatment provided mild relief.    Past Medical History  Diagnosis Date  . ASCVD (arteriosclerotic cardiovascular disease)   . Diabetes mellitus   . Hypertension   . History of cocaine abuse   . Tobacco abuse   . Depression   . Suicidal ideation   . Chronic systolic CHF (congestive heart failure) ([place])   . COPD (chronic obstructive pulmonary disease) ([place])   . Coronary artery disease    Past Surgical History  Procedure Laterality Date  . Coronary artery bypass graft  2001  . Lumbar disc surgery  02/1999    Discectomy and fusion  . Vasectomy      Subsequent reversal  . Insert / replace / remove pacemaker    . Cardiac defibrillator placement    . Cholecystectomy N/A 08/30/2015    Procedure: LAPAROSCOPIC CHOLECYSTECTOMY;  Surgeon: Hubbard Robinson, MD;   Location: [name];  Service: General;  Laterality: N/A;  . Cardiac defibrillator placement      Family History  Problem Relation Age of Onset  . CAD Father   . Diabetes Father   . Heart failure Father     Social History  Substance Use Topics  . Smoking status: Former Smoker -- 1.50 packs/day for 23 years    Types: Cigarettes    Quit date: 08/27/2015  . Smokeless tobacco: Never Used  . Alcohol Use: No    Allergies  Allergen Reactions  . Codeine Nausea And Vomiting    Prior to Admission medications   Medication Sig Start Date End Date Taking? Authorizing Provider  acetaminophen (TYLENOL) 500 MG tablet Take 1,000 mg by mouth every 6 (six) hours as needed for mild pain or headache.   Yes Historical Provider, MD  albuterol (PROVENTIL HFA;VENTOLIN HFA) 108 (90 BASE) MCG/ACT inhaler Inhale 2 puffs into the lungs every 6 (six) hours as needed for wheezing or shortness of breath.   Yes Historical Provider, MD  aspirin EC 81 MG tablet Take 81 mg by mouth daily.   Yes Historical Provider, MD  atorvastatin (LIPITOR) 20 MG tablet Take 20 mg by mouth every evening.   Yes Historical Provider, MD  baclofen (LIORESAL) 10 MG tablet Take 10 mg by mouth 3 (three) times daily as needed for muscle spasms.    Yes Historical Provider, MD  benazepril (LOTENSIN) 5 MG tablet Take 1 tablet (5 mg total) by mouth daily. 09/02/15  Yes Max Sane, MD  digoxin (LANOXIN) 0.25 MG tablet Take 1 tablet (  0.25 mg total) by mouth daily. 11/03/15  Yes Dustin Flock, MD  escitalopram (LEXAPRO) 20 MG tablet Take 1 tablet (20 mg total) by mouth daily. 09/02/15  Yes Max Sane, MD  furosemide (LASIX) 40 MG tablet Take 1 tablet (40 mg total) by mouth 2 (two) times daily. Pt takes two tablets in the morning and one in the evening. Patient taking differently: Take 40 mg by mouth 2 (two) times daily. Pt takes 1 tablets in the morning and one in the evening. 11/03/15  Yes Dustin Flock, MD  insulin aspart (NOVOLOG) 100 UNIT/ML  injection Inject 10 Units into the skin 3 (three) times daily before meals. 09/02/15  Yes Vipul Manuella Ghazi, MD  insulin glargine (LANTUS) 100 UNIT/ML injection Inject 30 Units into the skin at bedtime.   Yes Historical Provider, MD  metFORMIN (GLUCOPHAGE) 1000 MG tablet Take 1 tablet (1,000 mg total) by mouth 2 (two) times daily. 09/02/15  Yes Vipul Manuella Ghazi, MD  metolazone (ZAROXOLYN) 5 MG tablet Take 1 tablet (5 mg total) by mouth daily. 11/03/15  Yes Dustin Flock, MD  metoprolol succinate (TOPROL-XL) 100 MG 24 hr tablet Take 100 mg by mouth every evening.   Yes Historical Provider, MD  oxyCODONE-acetaminophen (PERCOCET) 7.5-325 MG tablet Take 1 tablet by mouth every 8 (eight) hours as needed for severe pain. 11/03/15  Yes Dustin Flock, MD  polyethylene glycol (MIRALAX / GLYCOLAX) packet Take 17 g by mouth daily. 11/03/15  Yes Dustin Flock, MD  potassium chloride SA (K-DUR,KLOR-CON) 20 MEQ tablet Take 1 tablet (20 mEq total) by mouth 2 (two) times daily. 10/21/15  Yes Hillary Bow, MD     Review of Systems  Constitutional: Positive for appetite change and fatigue.  HENT: Negative for congestion, postnasal drip and sore throat.   Eyes: Negative.   Respiratory: Positive for shortness of breath. Negative for cough and chest tightness.   Cardiovascular: Positive for leg swelling (better). Negative for chest pain and palpitations.  Gastrointestinal: Negative for nausea, abdominal pain and abdominal distention.  Endocrine: Negative.   Genitourinary: Negative.   Musculoskeletal: Negative.   Skin: Negative.   Allergic/Immunologic: Negative.   Neurological: Negative for dizziness, light-headedness, numbness and headaches.  Hematological: Negative for adenopathy. Does not bruise/bleed easily.  Psychiatric/Behavioral: Negative for sleep disturbance (sleeping on  2 pillows) and dysphoric mood. The patient is nervous/anxious.        Objective:   Physical Exam  Constitutional: He is oriented to person,  place, and time. He appears well-developed and well-nourished.  HENT:  Head: Normocephalic and atraumatic.  Eyes: Conjunctivae are normal. Pupils are equal, round, and reactive to light.  Neck: Normal range of motion. Neck supple.  Cardiovascular: Normal rate and regular rhythm.   Pulmonary/Chest: Effort normal. He has no wheezes. He has no rales.  Abdominal: Soft. He exhibits no distension. There is no tenderness.  Musculoskeletal: He exhibits edema (trace amount bilateral lower legs). He exhibits no tenderness.  Neurological: He is alert and oriented to person, place, and time.  Skin: Skin is warm and dry.  Psychiatric: He has a normal mood and affect. His behavior is normal. Thought content normal.  Nursing note and vitals reviewed.   BP 89/62 mmHg  Pulse 76  Resp 20  Ht 5\' 9"  (1.753 m)  Wt 240 lb (108.863 kg)  BMI 35.43 kg/m2  SpO2 94%       Assessment & Plan:  1: Chronic heart failure with reduced ejection fraction- Patient presents with fatigue and shortness of breath upon  exertion. When he does experience symptoms, he will stop to rest until his symptoms subside which tends to occur fairly quickly. He hasn't been weighing himself because he currently doesn't have any scales. He thinks he can get some as we are currently out. Encouraged to begin weighing daily and call for an overnight weight gain of >2 pounds or a weekly weight gain of >5 pounds. He does have a mild amount of swelling in his lower legs but he says that they are much improved. Encouraged him to elevate them as much as he can. He currently has home health coming out twice a week. He is not adding salt to his food and is trying to choose low sodium foods. He has been taking 40mg  furosemide twice daily, potassium 29meq twice daily and metolazone 5mg  daily since he was discharged from the hospital on 11/03/15. Has an appointment with his cardiologist on 11/12/15. 2: Hypotension- Patient's blood pressure is low, lungs are  clear and there's just trace amount of pedal edema. Weight is down considerably from his hospital weight. Will stop metolazone and continue furosemide and potassium at this time. Will check a chemistry panel and call patient with results. 3: COPD- Appears to be stable at this time. Has an appointment with his PCP on 11/24/15.  Since he's seeing his cardiologist next week, we will see him in 2 weeks or sooner for any questions/problems before then.

## 2015-11-06 NOTE — Patient Instructions (Signed)
Resume weighing daily and call for an overnight weight gain of > 2 pounds or a weekly weight gain of >5 pounds. 

## 2015-11-06 NOTE — Telephone Encounter (Signed)
Patient did not show for his appointment at the [facility] on 11/06/15. Will attempt to reschedule.

## 2015-11-06 NOTE — Telephone Encounter (Signed)
Patient showed up 4 hours late for his appointment but was able to see him.

## 2015-11-07 ENCOUNTER — Telehealth: Payer: Self-pay | Admitting: Family

## 2015-11-07 NOTE — Telephone Encounter (Signed)
Left voicemail on sister's phone to ask the patient to contact us regarding his lab work. Tried calling the patient but when the phone picks up, it says that it's unreachable. Will also mail information to the patient regarding his lab work with instructions to increase his potassium tablets to 2 tablets twice daily due to his potassium level being low. Will ask cardiologist to recheck it next week. Metolazone was stopped yesterday. Renal function slightly worse as well.

## 2015-11-07 NOTE — Telephone Encounter (Signed)
Patient's sister called and said she was able to text the patient and that he would be able to call us on Monday. Advised sister, Con Memos, that patient's potassium level was low and that he should increase his potassium tablets to 2 in the morning and 2 in the evening (previously was taking 1 twice daily). Will also mail a letter to the patient as well. Have also faxed the lab results over to cardiologist asking for the labs to be rechecked when he sees them on 11/12/15.

## 2015-11-20 ENCOUNTER — Ambulatory Visit: Payer: Medicare HMO | Admitting: Family

## 2015-11-24 ENCOUNTER — Ambulatory Visit: Payer: Medicare HMO | Admitting: Family

## 2015-11-26 ENCOUNTER — Ambulatory Visit: Payer: Medicare HMO | Admitting: Family

## 2015-12-05 ENCOUNTER — Ambulatory Visit: Payer: Medicare HMO | Admitting: Family

## 2015-12-05 ENCOUNTER — Telehealth: Payer: Self-pay | Admitting: Family

## 2015-12-05 NOTE — Telephone Encounter (Signed)
Patient did not show for his [facility] appointment on 12/05/15. Will attempt to reschedule.

## 2016-01-12 ENCOUNTER — Inpatient Hospital Stay
Admission: EM | Admit: 2016-01-12 | Discharge: 2016-01-18 | DRG: 292 | Disposition: A | Discharge status: Home Health | Payer: Medicare HMO | Attending: Internal Medicine | Admitting: Internal Medicine

## 2016-01-12 ENCOUNTER — Emergency Department: Payer: Medicare HMO

## 2016-01-12 ENCOUNTER — Encounter: Payer: Self-pay | Admitting: Emergency Medicine

## 2016-01-12 DIAGNOSIS — Z95810 Presence of automatic (implantable) cardiac defibrillator: Secondary | ICD-10-CM

## 2016-01-12 DIAGNOSIS — I5023 Acute on chronic systolic (congestive) heart failure: Secondary | ICD-10-CM | POA: Diagnosis present

## 2016-01-12 DIAGNOSIS — I2510 Atherosclerotic heart disease of native coronary artery without angina pectoris: Secondary | ICD-10-CM | POA: Diagnosis present

## 2016-01-12 DIAGNOSIS — E876 Hypokalemia: Secondary | ICD-10-CM | POA: Diagnosis present

## 2016-01-12 DIAGNOSIS — Z794 Long term (current) use of insulin: Secondary | ICD-10-CM | POA: Diagnosis not present

## 2016-01-12 DIAGNOSIS — Z87891 Personal history of nicotine dependence: Secondary | ICD-10-CM

## 2016-01-12 DIAGNOSIS — F329 Major depressive disorder, single episode, unspecified: Secondary | ICD-10-CM | POA: Diagnosis present

## 2016-01-12 DIAGNOSIS — Z7982 Long term (current) use of aspirin: Secondary | ICD-10-CM | POA: Diagnosis not present

## 2016-01-12 DIAGNOSIS — Z885 Allergy status to narcotic agent status: Secondary | ICD-10-CM

## 2016-01-12 DIAGNOSIS — E785 Hyperlipidemia, unspecified: Secondary | ICD-10-CM | POA: Diagnosis present

## 2016-01-12 DIAGNOSIS — I110 Hypertensive heart disease with heart failure: Principal | ICD-10-CM | POA: Diagnosis present

## 2016-01-12 DIAGNOSIS — I10 Essential (primary) hypertension: Secondary | ICD-10-CM | POA: Diagnosis present

## 2016-01-12 DIAGNOSIS — I255 Ischemic cardiomyopathy: Secondary | ICD-10-CM | POA: Diagnosis present

## 2016-01-12 DIAGNOSIS — Z79891 Long term (current) use of opiate analgesic: Secondary | ICD-10-CM | POA: Diagnosis not present

## 2016-01-12 DIAGNOSIS — E119 Type 2 diabetes mellitus without complications: Secondary | ICD-10-CM | POA: Diagnosis present

## 2016-01-12 DIAGNOSIS — R188 Other ascites: Secondary | ICD-10-CM | POA: Diagnosis present

## 2016-01-12 DIAGNOSIS — Z951 Presence of aortocoronary bypass graft: Secondary | ICD-10-CM

## 2016-01-12 DIAGNOSIS — G8929 Other chronic pain: Secondary | ICD-10-CM | POA: Diagnosis present

## 2016-01-12 DIAGNOSIS — R0602 Shortness of breath: Secondary | ICD-10-CM | POA: Diagnosis present

## 2016-01-12 DIAGNOSIS — J449 Chronic obstructive pulmonary disease, unspecified: Secondary | ICD-10-CM | POA: Diagnosis present

## 2016-01-12 DIAGNOSIS — Z7901 Long term (current) use of anticoagulants: Secondary | ICD-10-CM

## 2016-01-12 DIAGNOSIS — Z7984 Long term (current) use of oral hypoglycemic drugs: Secondary | ICD-10-CM | POA: Diagnosis not present

## 2016-01-12 DIAGNOSIS — R140 Abdominal distension (gaseous): Secondary | ICD-10-CM

## 2016-01-12 DIAGNOSIS — Z79899 Other long term (current) drug therapy: Secondary | ICD-10-CM

## 2016-01-12 DIAGNOSIS — K760 Fatty (change of) liver, not elsewhere classified: Secondary | ICD-10-CM | POA: Diagnosis present

## 2016-01-12 LAB — COMPREHENSIVE METABOLIC PANEL
ALBUMIN: 3.5 g/dL (ref 3.5–5.0)
ALK PHOS: 84 U/L (ref 38–126)
ALT: 12 U/L — AB (ref 17–63)
AST: 20 U/L (ref 15–41)
Anion gap: 7 (ref 5–15)
BILIRUBIN TOTAL: 0.8 mg/dL (ref 0.3–1.2)
BUN: 41 mg/dL — AB (ref 6–20)
CHLORIDE: 92 mmol/L — AB (ref 101–111)
CO2: 37 mmol/L — AB (ref 22–32)
Calcium: 8.7 mg/dL — ABNORMAL LOW (ref 8.9–10.3)
Creatinine, Ser: 1.15 mg/dL (ref 0.61–1.24)
GFR calc Af Amer: >60 mL/min (ref >60)
GLUCOSE: 254 mg/dL — AB (ref 65–99)
POTASSIUM: 3.1 mmol/L — AB (ref 3.5–5.1)
Sodium: 136 mmol/L (ref 135–145)
TOTAL PROTEIN: 6.7 g/dL (ref 6.5–8.1)

## 2016-01-12 LAB — URINALYSIS COMPLETE WITH MICROSCOPIC (ARMC ONLY)
BACTERIA UA: NONE SEEN
Bilirubin Urine: NEGATIVE
Glucose, UA: NEGATIVE mg/dL
Hgb urine dipstick: NEGATIVE
KETONES UR: NEGATIVE mg/dL
LEUKOCYTES UA: NEGATIVE
Nitrite: NEGATIVE
PH: 6 (ref 5.0–8.0)
PROTEIN: NEGATIVE mg/dL
RBC / HPF: NONE SEEN RBC/hpf (ref 0–5)
Specific Gravity, Urine: 1.014 (ref 1.005–1.030)
WBC UA: NONE SEEN WBC/hpf (ref 0–5)

## 2016-01-12 LAB — CBC
HEMATOCRIT: 38.1 % — AB (ref 40.0–52.0)
HEMOGLOBIN: 12.6 g/dL — AB (ref 13.0–18.0)
MCH: 29.3 pg (ref 26.0–34.0)
MCHC: 33.1 g/dL (ref 32.0–36.0)
MCV: 88.6 fL (ref 80.0–100.0)
Platelets: 186 10*3/uL (ref 150–440)
RBC: 4.3 MIL/uL — ABNORMAL LOW (ref 4.40–5.90)
RDW: 16.7 % — AB (ref 11.5–14.5)
WBC: 8.3 10*3/uL (ref 3.8–10.6)

## 2016-01-12 LAB — BRAIN NATRIURETIC PEPTIDE: B NATRIURETIC PEPTIDE 5: 2677 pg/mL — AB (ref 0.0–100.0)

## 2016-01-12 LAB — TROPONIN I: Troponin I: 0.03 ng/mL (ref <0.031)

## 2016-01-12 MED ORDER — FUROSEMIDE 10 MG/ML IJ SOLN
40.0000 mg | Freq: Once | INTRAMUSCULAR | Status: AC
  Administered 2016-01-12: 40 mg via INTRAVENOUS
  Filled 2016-01-12: qty 4

## 2016-01-12 NOTE — ED Provider Notes (Addendum)
[facility] Emergency Department Provider Note  ____________________________________________   I have reviewed the triage vital signs and the nursing notes.   HISTORY  Chief Complaint Shortness of Breath    HPI Ronald Miller. is a 60 y.o. male with a history of CHF,he states over the last several days he has had increased shortness of breath. He states that he has significant orthopnea and exertional dyspnea, he also has bilateral leg swelling goes up into his abdomen. He states he does not have any history of liver problems. He feels that he is in acute failure. Denies any chest pain. Denies any nausea vomiting diarrhea. Denies any abdominal pain.      Past Medical History  Diagnosis Date  . ASCVD (arteriosclerotic cardiovascular disease)   . Diabetes mellitus   . Hypertension   . History of cocaine abuse   . Tobacco abuse   . Depression   . Suicidal ideation   . Chronic systolic CHF (congestive heart failure) ([place])   . COPD (chronic obstructive pulmonary disease) ([place])   . Coronary artery disease     Patient Active Problem List   Diagnosis Date Noted  . Pressure ulcer 10/15/2015  . Dyspnea 10/14/2015  . Fluid overload 10/14/2015  . Chronic cholecystitis with calculus   . COPD (chronic obstructive pulmonary disease) ([place]) 08/28/2015  . HTN (hypertension) 08/28/2015  . Depression 08/28/2015  . Elevated troponin 08/28/2015  . AICD discharge 08/28/2015  . Hypokalemia 08/28/2015  . Hypotension 08/28/2015  . Chronic systolic heart failure ([place]) 02/04/2015  . Arteriosclerosis of coronary artery 03/05/2014  . Diabetes mellitus ([place]) 03/05/2014  . HLD (hyperlipidemia) 03/05/2014  . Type 2 diabetes mellitus ([place]) 08/06/2009  . TOBACCO ABUSE 08/06/2009  . ATHEROSCLEROTIC CARDIOVASCULAR DISEASE 08/06/2009  . LOW BACK PAIN, CHRONIC 08/06/2009    Past Surgical History  Procedure Laterality Date  . Coronary artery bypass graft  2001  .  Lumbar disc surgery  02/1999    Discectomy and fusion  . Vasectomy      Subsequent reversal  . Insert / replace / remove pacemaker    . Cardiac defibrillator placement    . Cholecystectomy N/A 08/30/2015    Procedure: LAPAROSCOPIC CHOLECYSTECTOMY;  Surgeon: Hubbard Robinson, MD;  Location: [name];  Service: General;  Laterality: N/A;  . Cardiac defibrillator placement      Current Outpatient Rx  Name  Route  Sig  Dispense  Refill  . acetaminophen (TYLENOL) 500 MG tablet   Oral   Take 1,000 mg by mouth every 6 (six) hours as needed for mild pain or headache.         . albuterol (PROVENTIL HFA;VENTOLIN HFA) 108 (90 BASE) MCG/ACT inhaler   Inhalation   Inhale 2 puffs into the lungs every 6 (six) hours as needed for wheezing or shortness of breath.         Marland Kitchen aspirin EC 81 MG tablet   Oral   Take 81 mg by mouth daily.         Marland Kitchen atorvastatin (LIPITOR) 20 MG tablet   Oral   Take 20 mg by mouth every evening.         . baclofen (LIORESAL) 10 MG tablet   Oral   Take 10 mg by mouth 3 (three) times daily as needed for muscle spasms.          . benazepril (LOTENSIN) 5 MG tablet   Oral   Take 1 tablet (5 mg total) by mouth  daily.   30 tablet   0   . digoxin (LANOXIN) 0.25 MG tablet   Oral   Take 1 tablet (0.25 mg total) by mouth daily.   30 tablet   2   . escitalopram (LEXAPRO) 20 MG tablet   Oral   Take 1 tablet (20 mg total) by mouth daily.   30 tablet   0   . furosemide (LASIX) 40 MG tablet   Oral   Take 1 tablet (40 mg total) by mouth 2 (two) times daily. Pt takes two tablets in the morning and one in the evening. Patient taking differently: Take 40 mg by mouth 2 (two) times daily. Pt takes 1 tablets in the morning and one in the evening.   60 tablet   0   . insulin aspart (NOVOLOG) 100 UNIT/ML injection   Subcutaneous   Inject 10 Units into the skin 3 (three) times daily before meals.   10 mL   11   . insulin glargine (LANTUS) 100 UNIT/ML  injection   Subcutaneous   Inject 30 Units into the skin at bedtime.         . metFORMIN (GLUCOPHAGE) 1000 MG tablet   Oral   Take 1 tablet (1,000 mg total) by mouth 2 (two) times daily.   60 tablet   0   . metoprolol succinate (TOPROL-XL) 100 MG 24 hr tablet   Oral   Take 100 mg by mouth every evening.         Marland Kitchen oxyCODONE-acetaminophen (PERCOCET) 7.5-325 MG tablet   Oral   Take 1 tablet by mouth every 8 (eight) hours as needed for severe pain.   30 tablet   0   . polyethylene glycol (MIRALAX / GLYCOLAX) packet   Oral   Take 17 g by mouth daily.   14 each   0   . potassium chloride SA (K-DUR,KLOR-CON) 20 MEQ tablet   Oral   Take 1 tablet (20 mEq total) by mouth 2 (two) times daily.           Allergies Codeine  Family History  Problem Relation Age of Onset  . CAD Father   . Diabetes Father   . Heart failure Father     Social History Social History  Substance Use Topics  . Smoking status: Former Smoker -- 1.50 packs/day for 23 years    Types: Cigarettes    Quit date: 08/27/2015  . Smokeless tobacco: Never Used  . Alcohol Use: No    Review of Systems Constitutional: No fever/chills Eyes: No visual changes. ENT: No sore throat. No stiff neck no neck pain Cardiovascular: Denies chest pain. Respiratory: Positive shortness of breath. Gastrointestinal:   no vomiting.  No diarrhea.  No constipation. Genitourinary: Negative for dysuria. Musculoskeletal: Negative lower extremity swelling Skin: Negative for rash. Neurological: Negative for headaches, focal weakness or numbness. 10-point ROS otherwise negative.  ____________________________________________   PHYSICAL EXAM:  VITAL SIGNS: ED Triage Vitals  Enc Vitals Group     BP 01/12/16 1850 106/76 mmHg     Pulse Rate 01/12/16 1850 89     Resp 01/12/16 1850 18     Temp 01/12/16 1850 97.9 F (36.6 C)     Temp Source 01/12/16 1850 Oral     SpO2 01/12/16 1850 96 %     Weight 01/12/16 1850 242 lb  (109.77 kg)     Height 01/12/16 1850 5\' 9"  (1.753 m)     Head Cir --  Peak Flow --      Pain Score 01/12/16 1910 8     Pain Loc --      Pain Edu? --      Excl. in [place]? --     Constitutional: Alert and oriented. Well appearing and in no acute distress. Eyes: Conjunctivae are normal. PERRL. EOMI. Head: Atraumatic. Nose: No congestion/rhinnorhea. Mouth/Throat: Mucous membranes are moist.  Oropharynx non-erythematous. Neck: No stridor.   Nontender with no meningismus Cardiovascular: Normal rate, regular rhythm. Grossly normal heart sounds.  Good peripheral circulation. Respiratory: Normal respiratory effort.  No retractions. Show bibasilar occasional slight crackle Abdominal: Soft and nontender. Obese, does appear distended and non-tympanic, No guarding no rebound Back:  There is no focal tenderness or step off there is no midline tenderness there are no lesions noted. there is no CVA tenderness Musculoskeletal: No lower extremity tenderness. No joint effusions, no DVT signs strong distal pulses 2+ symmetric bilateral edema Neurologic:  Normal speech and language. No gross focal neurologic deficits are appreciated.  Skin:  Skin is warm, dry and intact. No rash noted. Psychiatric: Mood and affect are normal. Speech and behavior are normal.  ____________________________________________   LABS (all labs ordered are listed, but only abnormal results are displayed)  Labs Reviewed  URINALYSIS COMPLETEWITH MICROSCOPIC ([place]) - Abnormal; Notable for the following:    Color, Urine YELLOW (*)    APPearance CLEAR (*)    Squamous Epithelial / LPF 0-5 (*)    All other components within normal limits  COMPREHENSIVE METABOLIC PANEL  CBC  TROPONIN I  BRAIN NATRIURETIC PEPTIDE   ____________________________________________  EKG  I personally interpreted any EKGs ordered by me or triage Ventricular paced rhythm rate 83  bpm ____________________________________________  RADIOLOGY  I reviewed any imaging ordered by me or triage that were performed during my shift and, if possible, patient and/or family made aware of any abnormal findings. ____________________________________________   PROCEDURES  Procedure(s) performed: None  Critical Care performed: None  ____________________________________________   INITIAL IMPRESSION / ASSESSMENT AND PLAN / ED COURSE  Pertinent labs & imaging results that were available during my care of the patient were reviewed by me and considered in my medical decision making (see chart for details).  Patient complaining of CHF symptoms. He feels that he has gained at least 14 pounds in the last few weeks, patient does have a low EF and his history is consistent with CHF. May need inpt abd imaging.  We will check blood work and reassess. He is in no acute distress at this time. Signed out at the end of my shift to Dr. Joni Fears. Dr. Jannifer Franklin aware. ____________________________________________   FINAL CLINICAL IMPRESSION(S) / ED DIAGNOSES  Final diagnoses:  None      This chart was dictated using voice recognition software.  Despite best efforts to proofread,  errors can occur which can change meaning.     Schuyler Amor, MD 01/12/16 2038  Schuyler Amor, MD 01/12/16 2047  Schuyler Amor, MD 01/12/16 2049  Schuyler Amor, MD 01/12/16 2105

## 2016-01-12 NOTE — H&P (Signed)
[name]    MR#:  [id number]  DATE OF BIRTH:  [birth year]  DATE OF ADMISSION:  01/12/2016  PRIMARY CARE PHYSICIAN: [facility]   REQUESTING/REFERRING PHYSICIAN: Joni Fears, MD  CHIEF COMPLAINT:   Chief Complaint  Patient presents with  . Shortness of Breath    HISTORY OF PRESENT ILLNESS:  Ronald Miller  is a 60 y.o. male who presents with Progressive increase in orthopnea, abdominal and lower extremity swelling. Patient is a known history of significant systolic heart failure with EF of 20-25%. He states that he has been admitted the past with exacerbation of his CHF. In the ED today he is found to have an elevated BNP. Hospitals were called for admission.  PAST MEDICAL HISTORY:   Past Medical History  Diagnosis Date  . ASCVD (arteriosclerotic cardiovascular disease)   . Diabetes mellitus   . Hypertension   . History of cocaine abuse   . Tobacco abuse   . Depression   . Suicidal ideation   . Chronic systolic CHF (congestive heart failure) ([place])   . COPD (chronic obstructive pulmonary disease) ([place])   . Coronary artery disease     PAST SURGICAL HISTORY:   Past Surgical History  Procedure Laterality Date  . Coronary artery bypass graft  2001  . Lumbar disc surgery  02/1999    Discectomy and fusion  . Vasectomy      Subsequent reversal  . Insert / replace / remove pacemaker    . Cardiac defibrillator placement    . Cholecystectomy N/A 08/30/2015    Procedure: LAPAROSCOPIC CHOLECYSTECTOMY;  Surgeon: Hubbard Robinson, MD;  Location: [name];  Service: General;  Laterality: N/A;  . Cardiac defibrillator placement      SOCIAL HISTORY:   Social History  Substance Use Topics  . Smoking status: Former Smoker -- 1.50 packs/day for 23 years    Types: Cigarettes    Quit date: 08/27/2015  . Smokeless tobacco: Never Used  . Alcohol Use: No    FAMILY HISTORY:   Family  History  Problem Relation Age of Onset  . CAD Father   . Diabetes Father   . Heart failure Father     DRUG ALLERGIES:   Allergies  Allergen Reactions  . Codeine Nausea And Vomiting    MEDICATIONS AT HOME:   Prior to Admission medications   Medication Sig Start Date End Date Taking? Authorizing Provider  acetaminophen (TYLENOL) 500 MG tablet Take 1,000 mg by mouth every 6 (six) hours as needed for mild pain or headache.    Historical Provider, MD  albuterol (PROVENTIL HFA;VENTOLIN HFA) 108 (90 BASE) MCG/ACT inhaler Inhale 2 puffs into the lungs every 6 (six) hours as needed for wheezing or shortness of breath.    Historical Provider, MD  aspirin EC 81 MG tablet Take 81 mg by mouth daily.    Historical Provider, MD  atorvastatin (LIPITOR) 20 MG tablet Take 20 mg by mouth every evening.    Historical Provider, MD  baclofen (LIORESAL) 10 MG tablet Take 10 mg by mouth 3 (three) times daily as needed for muscle spasms.     Historical Provider, MD  benazepril (LOTENSIN) 5 MG tablet Take 1 tablet (5 mg total) by mouth daily. 09/02/15   Max Sane, MD  digoxin (LANOXIN) 0.25 MG tablet Take 1 tablet (0.25 mg total) by mouth daily. 11/03/15   Dustin Flock, MD  escitalopram (LEXAPRO) 20 MG tablet  Take 1 tablet (20 mg total) by mouth daily. 09/02/15   Max Sane, MD  furosemide (LASIX) 40 MG tablet Take 1 tablet (40 mg total) by mouth 2 (two) times daily. Pt takes two tablets in the morning and one in the evening. Patient taking differently: Take 40 mg by mouth 2 (two) times daily. Pt takes 1 tablets in the morning and one in the evening. 11/03/15   Dustin Flock, MD  insulin aspart (NOVOLOG) 100 UNIT/ML injection Inject 10 Units into the skin 3 (three) times daily before meals. 09/02/15   Max Sane, MD  insulin glargine (LANTUS) 100 UNIT/ML injection Inject 30 Units into the skin at bedtime.    Historical Provider, MD  metFORMIN (GLUCOPHAGE) 1000 MG tablet Take 1 tablet (1,000 mg total) by mouth  2 (two) times daily. 09/02/15   Max Sane, MD  metoprolol succinate (TOPROL-XL) 100 MG 24 hr tablet Take 100 mg by mouth every evening.    Historical Provider, MD  oxyCODONE-acetaminophen (PERCOCET) 7.5-325 MG tablet Take 1 tablet by mouth every 8 (eight) hours as needed for severe pain. 11/03/15   Dustin Flock, MD  polyethylene glycol (MIRALAX / GLYCOLAX) packet Take 17 g by mouth daily. 11/03/15   Dustin Flock, MD  potassium chloride SA (K-DUR,KLOR-CON) 20 MEQ tablet Take 1 tablet (20 mEq total) by mouth 2 (two) times daily. 10/21/15   Hillary Bow, MD    REVIEW OF SYSTEMS:  Review of Systems  Constitutional: Negative for fever, chills, weight loss and malaise/fatigue.  HENT: Negative for ear pain, hearing loss and tinnitus.   Eyes: Negative for blurred vision, double vision, pain and redness.  Respiratory: Positive for shortness of breath. Negative for cough and hemoptysis.   Cardiovascular: Positive for orthopnea and leg swelling. Negative for chest pain and palpitations.  Gastrointestinal: Negative for nausea, vomiting, abdominal pain, diarrhea and constipation.  Genitourinary: Negative for dysuria, frequency and hematuria.  Musculoskeletal: Negative for back pain, joint pain and neck pain.  Skin:       No acne, rash, or lesions  Neurological: Negative for dizziness, tremors, focal weakness and weakness.  Endo/Heme/Allergies: Negative for polydipsia. Does not bruise/bleed easily.  Psychiatric/Behavioral: Negative for depression. The patient is not nervous/anxious and does not have insomnia.      VITAL SIGNS:   Filed Vitals:   01/12/16 2100 01/12/16 2131 01/12/16 2142 01/12/16 2200  BP: 104/81 108/93 108/93 111/89  Pulse: 66 63 69 81  Temp:      TempSrc:      Resp: 18 21 22    Height:      Weight:      SpO2: 92% 94% 96% 95%   Wt Readings from Last 3 Encounters:  01/12/16 116.4 kg (256 lb 9.9 oz)  11/06/15 108.863 kg (240 lb)  11/03/15 113.807 kg (250 lb 14.4 oz)     PHYSICAL EXAMINATION:  Physical Exam  Vitals reviewed. Constitutional: He is oriented to person, place, and time. He appears well-developed and well-nourished. No distress.  HENT:  Head: Normocephalic and atraumatic.  Mouth/Throat: Oropharynx is clear and moist.  Eyes: Conjunctivae and EOM are normal. Pupils are equal, round, and reactive to light. No scleral icterus.  Neck: Normal range of motion. Neck supple. JVD present. No thyromegaly present.  Cardiovascular: Normal rate, regular rhythm and intact distal pulses.  Exam reveals no gallop and no friction rub.   Murmur (2/6 systolic murmur) heard. Respiratory: Effort normal and breath sounds normal. No respiratory distress. He has no wheezes. He has no  rales.  GI: Soft. Bowel sounds are normal. He exhibits distension. There is no tenderness.  Musculoskeletal: Normal range of motion. He exhibits edema (2+ pitting, thigh-high).  No arthritis, no gout  Lymphadenopathy:    He has no cervical adenopathy.  Neurological: He is alert and oriented to person, place, and time. No cranial nerve deficit.  No dysarthria, no aphasia  Skin: Skin is warm and dry. No rash noted. No erythema.  Psychiatric: He has a normal mood and affect. His behavior is normal. Judgment and thought content normal.    LABORATORY PANEL:   CBC  Recent Labs Lab 01/12/16 1922  WBC 8.3  HGB 12.6*  HCT 38.1*  PLT 186   ------------------------------------------------------------------------------------------------------------------  Chemistries   Recent Labs Lab 01/12/16 1922  NA 136  K 3.1*  CL 92*  CO2 37*  GLUCOSE 254*  BUN 41*  CREATININE 1.15  CALCIUM 8.7*  AST 20  ALT 12*  ALKPHOS 84  BILITOT 0.8   ------------------------------------------------------------------------------------------------------------------  Cardiac Enzymes  Recent Labs Lab 01/12/16 1922  TROPONINI 0.03    ------------------------------------------------------------------------------------------------------------------  RADIOLOGY:  Dg Chest 2 View  01/12/2016  CLINICAL DATA:  Shortness of Breath EXAM: CHEST  2 VIEW COMPARISON:  Chest radiograph October 27, 2015 and chest CT October 29, 2015 FINDINGS: There is no appreciable edema or consolidation. L heart is slightly enlarged with pulmonary vascularity within normal limits. Patient is status post coronary artery bypass grafting. Pacemaker leads are attached to the right atrium, right ventricle, and left ventricle. No adenopathy evident. There is mild degenerative change in the thoracic spine. IMPRESSION: Mild cardiac enlargement.  No edema or consolidation. Electronically Signed   By: Lowella Grip III M.D.   On: 01/12/2016 19:52    EKG:   Orders placed or performed during the hospital encounter of 01/12/16  . EKG 12-Lead  . EKG 12-Lead  . ED EKG  . ED EKG    IMPRESSION AND PLAN:  Principal Problem:   Acute on chronic systolic heart failure (HCC) - IV Lasix given in the ED, however we are unable to diurese aggressively due to the patient's borderline to low blood pressure. We will admit him on telemetry, repeat his echocardiogram tomorrow, continue his home dose diuretics at this point, and get a cardiology consult. He may be a patient who benefits from slower diuresis with the Lasix drip. Active Problems:   Type 2 diabetes mellitus (HCC) - sliding scale insulin with corresponding glucose checks before meals at bedtime and heart healthy/carb modified diet   COPD (chronic obstructive pulmonary disease) ([place]) - continue home when necessary inhaler   HTN (hypertension) - holding home antihypertensives for now given his borderline low blood pressure.   Arteriosclerosis of coronary artery - continue home meds except for those that might lower his blood pressure is above   HLD (hyperlipidemia) - continue home meds  All the records are  reviewed and case discussed with ED provider. Management plans discussed with the patient and/or family.  DVT PROPHYLAXIS: SubQ lovenox  GI PROPHYLAXIS: None  ADMISSION STATUS: Inpatient  CODE STATUS: Full Code Status History    Date Active Date Inactive Code Status Order ID Comments User Context   10/27/2015  7:17 PM 11/03/2015  7:25 PM Full Code [id number]  Epifanio Lesches, MD ED   10/14/2015  1:39 AM 10/21/2015  8:57 PM Full Code [id number]  Saundra Shelling, MD Inpatient   08/28/2015  2:40 AM 09/01/2015  8:01 PM Full Code [id number]  Lance Coon, MD Inpatient  Advance Directive Documentation        Most Recent Value   Type of Advance Directive  Living will   Pre-existing out of facility DNR order (yellow form or pink MOST form)     "MOST" Form in Place?        TOTAL TIME TAKING CARE OF THIS PATIENT: 45 minutes.    Glennette Galster [place] 01/12/2016, 10:43 PM  [facility]  Office  [phone]  CC: Primary care physician; [facility]

## 2016-01-12 NOTE — ED Notes (Signed)
Pt in via triage with complaints of SOB over the last week, worsening over the last couple of days.  Pt reports hx of CHF, COPD, states he has had to have fluid drained from abdomen before.  Ascites noted to abdomen.  Pt A/Ox4, no signs of immediate distress at this time.

## 2016-01-12 NOTE — ED Notes (Signed)
Pt arrived to the ED accompanied by his sister for complaints of shortness of breath for a week Pt states that he has CHF and this feel like an exacerbation of it. Pt also reports to "getting fluid removed from his abdomen." Pt is AOx4 in no apparent distress with obvious abdominal distrension.

## 2016-01-13 ENCOUNTER — Inpatient Hospital Stay: Payer: Medicare HMO

## 2016-01-13 ENCOUNTER — Inpatient Hospital Stay: Admit: 2016-01-13 | Payer: Medicare HMO

## 2016-01-13 LAB — CBC
HCT: 39.8 % — ABNORMAL LOW (ref 40.0–52.0)
HEMATOCRIT: 36 % — AB (ref 40.0–52.0)
Hemoglobin: 12 g/dL — ABNORMAL LOW (ref 13.0–18.0)
Hemoglobin: 13 g/dL (ref 13.0–18.0)
MCH: 29.3 pg (ref 26.0–34.0)
MCH: 28.9 pg (ref 26.0–34.0)
MCHC: 33.3 g/dL (ref 32.0–36.0)
MCHC: 32.7 g/dL (ref 32.0–36.0)
MCV: 88 fL (ref 80.0–100.0)
MCV: 88.6 fL (ref 80.0–100.0)
Platelets: 171 10*3/uL (ref 150–440)
Platelets: 183 10*3/uL (ref 150–440)
RBC: 4.09 MIL/uL — ABNORMAL LOW (ref 4.40–5.90)
RBC: 4.49 MIL/uL (ref 4.40–5.90)
RDW: 16.4 % — AB (ref 11.5–14.5)
RDW: 16.8 % — ABNORMAL HIGH (ref 11.5–14.5)
WBC: 7.1 10*3/uL (ref 3.8–10.6)
WBC: 6.8 10*3/uL (ref 3.8–10.6)

## 2016-01-13 LAB — GLUCOSE, CAPILLARY
GLUCOSE-CAPILLARY: 188 mg/dL — AB (ref 65–99)
GLUCOSE-CAPILLARY: 152 mg/dL — AB (ref 65–99)
GLUCOSE-CAPILLARY: 207 mg/dL — AB (ref 65–99)
Glucose-Capillary: 265 mg/dL — ABNORMAL HIGH (ref 65–99)
Glucose-Capillary: 148 mg/dL — ABNORMAL HIGH (ref 65–99)

## 2016-01-13 LAB — BASIC METABOLIC PANEL
ANION GAP: 7 (ref 5–15)
BUN: 37 mg/dL — ABNORMAL HIGH (ref 6–20)
CALCIUM: 8.5 mg/dL — AB (ref 8.9–10.3)
CO2: 36 mmol/L — ABNORMAL HIGH (ref 22–32)
CREATININE: 1.35 mg/dL — AB (ref 0.61–1.24)
Chloride: 92 mmol/L — ABNORMAL LOW (ref 101–111)
GFR, EST NON AFRICAN AMERICAN: 56 mL/min — AB (ref >60)
GLUCOSE: 145 mg/dL — AB (ref 65–99)
Potassium: 3.2 mmol/L — ABNORMAL LOW (ref 3.5–5.1)
Sodium: 135 mmol/L (ref 135–145)

## 2016-01-13 LAB — MRSA PCR SCREENING: MRSA by PCR: NEGATIVE

## 2016-01-13 LAB — TROPONIN I
TROPONIN I: 0.04 ng/mL — AB (ref <0.031)
TROPONIN I: 0.03 ng/mL (ref <0.031)
Troponin I: 0.03 ng/mL (ref <0.031)

## 2016-01-13 LAB — CREATININE, SERUM: Creatinine, Ser: 1.27 mg/dL — ABNORMAL HIGH (ref 0.61–1.24)

## 2016-01-13 LAB — HEMOGLOBIN A1C: HEMOGLOBIN A1C: 8.4 % — AB (ref 4.0–6.0)

## 2016-01-13 MED ORDER — OXYCODONE HCL 5 MG PO TABS
5.0000 mg | ORAL_TABLET | ORAL | Status: DC | PRN
  Administered 2016-01-13 – 2016-01-18 (×14): 5 mg via ORAL
  Filled 2016-01-13 (×14): qty 1

## 2016-01-13 MED ORDER — BENAZEPRIL HCL 5 MG PO TABS
5.0000 mg | ORAL_TABLET | Freq: Every day | ORAL | Status: DC
  Filled 2016-01-13: qty 1

## 2016-01-13 MED ORDER — INSULIN ASPART 100 UNIT/ML ~~LOC~~ SOLN
0.0000 [IU] | Freq: Every day | SUBCUTANEOUS | Status: DC
  Administered 2016-01-13: 3 [IU] via SUBCUTANEOUS
  Administered 2016-01-13 – 2016-01-14 (×2): 2 [IU] via SUBCUTANEOUS
  Administered 2016-01-15: 3 [IU] via SUBCUTANEOUS
  Filled 2016-01-13 (×3): qty 2
  Filled 2016-01-13: qty 3

## 2016-01-13 MED ORDER — SODIUM CHLORIDE 0.9% FLUSH
3.0000 mL | Freq: Two times a day (BID) | INTRAVENOUS | Status: DC
  Administered 2016-01-13 – 2016-01-18 (×12): 3 mL via INTRAVENOUS

## 2016-01-13 MED ORDER — ESCITALOPRAM OXALATE 10 MG PO TABS
20.0000 mg | ORAL_TABLET | Freq: Every day | ORAL | Status: DC
Start: 2016-01-13 — End: 2016-01-18
  Administered 2016-01-13 – 2016-01-18 (×6): 20 mg via ORAL
  Filled 2016-01-13 (×6): qty 2

## 2016-01-13 MED ORDER — ASPIRIN EC 81 MG PO TBEC
81.0000 mg | DELAYED_RELEASE_TABLET | Freq: Every day | ORAL | Status: DC
  Administered 2016-01-13 – 2016-01-18 (×6): 81 mg via ORAL
  Filled 2016-01-13 (×6): qty 1

## 2016-01-13 MED ORDER — ACETAMINOPHEN 650 MG RE SUPP
650.0000 mg | Freq: Four times a day (QID) | RECTAL | Status: DC | PRN
Start: 2016-01-13 — End: 2016-01-18

## 2016-01-13 MED ORDER — INSULIN GLARGINE 100 UNIT/ML ~~LOC~~ SOLN
17.0000 [IU] | Freq: Every day | SUBCUTANEOUS | Status: DC
  Administered 2016-01-13 – 2016-01-17 (×5): 17 [IU] via SUBCUTANEOUS
  Filled 2016-01-13 (×6): qty 0.17

## 2016-01-13 MED ORDER — POTASSIUM CHLORIDE CRYS ER 20 MEQ PO TBCR
20.0000 meq | EXTENDED_RELEASE_TABLET | Freq: Two times a day (BID) | ORAL | Status: DC
  Administered 2016-01-13 – 2016-01-16 (×6): 20 meq via ORAL
  Filled 2016-01-13 (×6): qty 1

## 2016-01-13 MED ORDER — METOPROLOL SUCCINATE ER 100 MG PO TB24: 100.0000 mg | ORAL_TABLET | Freq: Every day | ORAL | Status: DC

## 2016-01-13 MED ORDER — INSULIN ASPART 100 UNIT/ML ~~LOC~~ SOLN
0.0000 [IU] | Freq: Three times a day (TID) | SUBCUTANEOUS | Status: DC
  Administered 2016-01-13: 1 [IU] via SUBCUTANEOUS
  Administered 2016-01-13 (×2): 2 [IU] via SUBCUTANEOUS
  Administered 2016-01-14: 5 [IU] via SUBCUTANEOUS
  Administered 2016-01-14: 1 [IU] via SUBCUTANEOUS
  Administered 2016-01-14 – 2016-01-15 (×3): 2 [IU] via SUBCUTANEOUS
  Administered 2016-01-16 (×2): 1 [IU] via SUBCUTANEOUS
  Administered 2016-01-16: 2 [IU] via SUBCUTANEOUS
  Administered 2016-01-17: 3 [IU] via SUBCUTANEOUS
  Administered 2016-01-17: 1 [IU] via SUBCUTANEOUS
  Administered 2016-01-17: 2 [IU] via SUBCUTANEOUS
  Filled 2016-01-13 (×4): qty 2
  Filled 2016-01-13: qty 1
  Filled 2016-01-13: qty 5
  Filled 2016-01-13: qty 1
  Filled 2016-01-13 (×2): qty 2
  Filled 2016-01-13 (×3): qty 1
  Filled 2016-01-13: qty 2

## 2016-01-13 MED ORDER — ACETAMINOPHEN 325 MG PO TABS
650.0000 mg | ORAL_TABLET | Freq: Four times a day (QID) | ORAL | Status: DC | PRN
  Administered 2016-01-14: 650 mg via ORAL
  Filled 2016-01-13: qty 2

## 2016-01-13 MED ORDER — DIGOXIN 250 MCG PO TABS
0.2500 mg | ORAL_TABLET | Freq: Every day | ORAL | Status: DC
  Administered 2016-01-13 – 2016-01-18 (×6): 0.25 mg via ORAL
  Filled 2016-01-13 (×6): qty 1

## 2016-01-13 MED ORDER — POTASSIUM CHLORIDE CRYS ER 20 MEQ PO TBCR
40.0000 meq | EXTENDED_RELEASE_TABLET | Freq: Once | ORAL | Status: AC
  Administered 2016-01-13: 40 meq via ORAL
  Filled 2016-01-13: qty 2

## 2016-01-13 MED ORDER — ONDANSETRON HCL 4 MG/2ML IJ SOLN: 4.0000 mg | Freq: Four times a day (QID) | INTRAMUSCULAR | Status: DC | PRN

## 2016-01-13 MED ORDER — ATORVASTATIN CALCIUM 20 MG PO TABS
20.0000 mg | ORAL_TABLET | Freq: Every evening | ORAL | Status: DC
  Administered 2016-01-13 – 2016-01-17 (×5): 20 mg via ORAL
  Filled 2016-01-13 (×5): qty 1

## 2016-01-13 MED ORDER — FUROSEMIDE 10 MG/ML IJ SOLN
40.0000 mg | Freq: Two times a day (BID) | INTRAMUSCULAR | Status: DC
  Administered 2016-01-13 (×2): 40 mg via INTRAVENOUS
  Filled 2016-01-13 (×2): qty 4

## 2016-01-13 MED ORDER — ENOXAPARIN SODIUM 40 MG/0.4ML ~~LOC~~ SOLN
40.0000 mg | Freq: Every day | SUBCUTANEOUS | Status: DC
Start: 2016-01-13 — End: 2016-01-13
  Administered 2016-01-13: 40 mg via SUBCUTANEOUS
  Filled 2016-01-13: qty 0.4

## 2016-01-13 MED ORDER — ONDANSETRON HCL 4 MG PO TABS
4.0000 mg | ORAL_TABLET | Freq: Four times a day (QID) | ORAL | Status: DC | PRN
  Administered 2016-01-13: 4 mg via ORAL
  Filled 2016-01-13: qty 1

## 2016-01-13 MED ORDER — ALBUTEROL SULFATE (2.5 MG/3ML) 0.083% IN NEBU: 3.0000 mL | INHALATION_SOLUTION | Freq: Four times a day (QID) | RESPIRATORY_TRACT | Status: DC | PRN

## 2016-01-13 MED ORDER — POTASSIUM CHLORIDE CRYS ER 20 MEQ PO TBCR
20.0000 meq | EXTENDED_RELEASE_TABLET | Freq: Every day | ORAL | Status: DC
  Administered 2016-01-13: 20 meq via ORAL
  Filled 2016-01-13: qty 1

## 2016-01-13 MED ORDER — FUROSEMIDE 40 MG PO TABS: 40.0000 mg | ORAL_TABLET | Freq: Two times a day (BID) | ORAL | Status: DC

## 2016-01-13 NOTE — Progress Notes (Signed)
Inpatient Diabetes Program Recommendations  AACE/ADA: New Consensus Statement on Inpatient Glycemic Control (2015)  Target Ranges:  Prepandial:   less than 140 mg/dL      Peak postprandial:   less than 180 mg/dL (1-2 hours)      Critically ill patients:  140 - 180 mg/dL  Results for Ronald Miller, Ronald Miller (MRN [id number]) as of 01/13/2016 08:55  Ref. Range 01/13/2016 01:37 01/13/2016 07:24  Glucose-Capillary Latest Ref Range: 65-99 mg/dL 265 (H) 148 (H)   Review of Glycemic Control  Diabetes history: DM2 Outpatient Diabetes medications: Lantus 30 units QHS, Novolog 10 units TID with meals, Metformin 1000 mg BID Current orders for Inpatient glycemic control: Novolog 0-9 units TID with meals, Novolog 0-5 units QHS  Inpatient Diabetes Program Recommendations: Insulin - Basal: Please consider ordering Lantus 17 units QHS (based on 114 kg x 0.15 units).  Thanks, Barnie Alderman, RN, MSN, CDE Diabetes Coordinator Inpatient Diabetes Program [phone] (Team Pager from [place] to [place]) [phone] ([facility] office) [phone] [facility] office) [phone] [facility] office)

## 2016-01-13 NOTE — Progress Notes (Signed)
Dr. Tressia Miners aware of low BP and asked PO BP meds be held today.

## 2016-01-13 NOTE — Progress Notes (Signed)
[facility] [place] NAME: Ronald Miller    MR#:  [id number]  DATE OF BIRTH:  1956/05/26  SUBJECTIVE:  CHIEF COMPLAINT:   Chief Complaint  Patient presents with  . Shortness of Breath   - complains of dyspnea, and abdominal distention - no fevers or chills. Feels weak.  REVIEW OF SYSTEMS:  Review of Systems  Constitutional: Negative for fever and chills.  Respiratory: Positive for shortness of breath. Negative for cough and wheezing.   Cardiovascular: Positive for leg swelling. Negative for chest pain and palpitations.  Gastrointestinal: Positive for abdominal pain. Negative for nausea, vomiting, diarrhea and constipation.  Genitourinary: Negative for dysuria and urgency.  Musculoskeletal: Negative for myalgias.  Neurological: Negative for dizziness, sensory change, speech change, focal weakness, seizures and headaches.  Psychiatric/Behavioral: Negative for depression.    DRUG ALLERGIES:   Allergies  Allergen Reactions  . Codeine Nausea And Vomiting    VITALS:  Blood pressure 101/69, pulse 64, temperature 97.4 F (36.3 C), temperature source Oral, resp. rate 18, height 5\' 9"  (1.753 m), weight 114.034 kg (251 lb 6.4 oz), SpO2 93 %.  PHYSICAL EXAMINATION:  Physical Exam  GENERAL:  60 y.o.-year-old obese patient lying in the bed with no acute distress.  EYES: Pupils equal, round, reactive to light and accommodation. No scleral icterus. Extraocular muscles intact.  HEENT: Head atraumatic, normocephalic. Oropharynx and nasopharynx clear.  NECK:  Supple, no jugular venous distention. No thyroid enlargement, no tenderness.  LUNGS: Normal breath sounds bilaterally, no wheezing, rhonchi. Fine rales at the bases. No use of accessory muscles of respiration.  CARDIOVASCULAR: S1, S2 normal. No rubs, or gallops. 3/6 systolic murmur ABDOMEN: distended abdomen, soft. Bowel sounds present. No organomegaly or mass.  EXTREMITIES: No  cyanosis, or clubbing. 2+ pedal edema NEUROLOGIC: Cranial nerves II through XII are intact. Muscle strength 5/5 in all extremities. Sensation intact. Gait not checked.  PSYCHIATRIC: The patient is alert and oriented x 3.  SKIN: No obvious rash, lesion, or ulcer.    LABORATORY PANEL:   CBC  Recent Labs Lab 01/13/16 0652  WBC 6.8  HGB 13.0  HCT 39.8*  PLT 183   ------------------------------------------------------------------------------------------------------------------  Chemistries   Recent Labs Lab 01/12/16 1922  01/13/16 0652  NA 136  --  135  K 3.1*  --  3.2*  CL 92*  --  92*  CO2 37*  --  36*  GLUCOSE 254*  --  145*  BUN 41*  --  37*  CREATININE 1.15  < > 1.35*  CALCIUM 8.7*  --  8.5*  AST 20  --   --   ALT 12*  --   --   ALKPHOS 84  --   --   BILITOT 0.8  --   --   < > = values in this interval not displayed. ------------------------------------------------------------------------------------------------------------------  Cardiac Enzymes  Recent Labs Lab 01/13/16 0652  TROPONINI 0.04*   ------------------------------------------------------------------------------------------------------------------  RADIOLOGY:  Dg Chest 2 View  01/12/2016  CLINICAL DATA:  Shortness of Breath EXAM: CHEST  2 VIEW COMPARISON:  Chest radiograph October 27, 2015 and chest CT October 29, 2015 FINDINGS: There is no appreciable edema or consolidation. L heart is slightly enlarged with pulmonary vascularity within normal limits. Patient is status post coronary artery bypass grafting. Pacemaker leads are attached to the right atrium, right ventricle, and left ventricle. No adenopathy evident. There is mild degenerative change in the thoracic spine. IMPRESSION: Mild cardiac enlargement.  No edema  or consolidation. Electronically Signed   By: Lowella Grip III M.D.   On: 01/12/2016 19:52    EKG:   Orders placed or performed during the hospital encounter of 01/12/16  . EKG  12-Lead  . EKG 12-Lead  . ED EKG  . ED EKG    ASSESSMENT AND PLAN:   60 year old male with past medical history significant for CAD status post CABG, ischemic cardiomyopathy with EF of 25%, diabetes and hypertension presents to the hospital secondary to worsening weight gain and difficulty breathing.  #1 Acute on chronic systolic heart failure exacerbation- continue IV lasix bid - ECHO and cardiology consult - last EF 25% - on digoxin, Toprol and also benazepril. Continue statin  #2 diabetes mellitus-carb-controlled diet. Sliding scale insulin. His Lantus is on hold at this time. Also on metformin at home. Continue to monitor and adjust doses  #3 hypokalemia-replaced potassium especially while on Lasix  #4 hypertension-continue Lasix, benazepril and metoprolol  #5 CAD status post CABG-stable at this time. No chest pain. Troponins plateaued. Continue cardiac medications.  #6 ascites-secondary to CHF exacerbation. -Ultrasound of the abdomen and therapeutic tap today. -Last paracentesis was in January 2017 where 3 L transudate fluid was taken out.  #7 DVT prophylaxis-on Lovenox. Can be restarted after paracentesis.    All the records are reviewed and case discussed with Care Management/Social Workerr. Management plans discussed with the patient, family and they are in agreement.  CODE STATUS: Full Code  TOTAL TIME TAKING CARE OF THIS PATIENT: 38 minutes.   POSSIBLE D/C IN 2 DAYS, DEPENDING ON CLINICAL CONDITION.   Gladstone Lighter M.D on 01/13/2016 at 8:48 AM  Between 7am to 6pm - Pager - [phone]  After 6pm go to [email or web address] - password [email or web address] [facility]  Office  [phone]  CC: Primary care physician; [facility]

## 2016-01-13 NOTE — Procedures (Signed)
US guided paracentesis.  Removed 7.2 liters of yellow fluid.  No immediate complication.

## 2016-01-13 NOTE — Consult Note (Signed)
[facility] Cardiology  CARDIOLOGY CONSULT NOTE  Patient ID: Ronald Miller. MRN: [id number] DOB/AGE: Aug 07, 1956 60 y.o.  Admit date: 01/12/2016 Referring Physician Tressia Miners Primary Physician  Primary Cardiologist Nehemiah Massed Reason for Consultation Congestive heart failure  HPI: 60 year old gentleman referred for congestive heart failure. The patient has known coronary disease, status post CABG 4 2001. He has known ischemic cardiomyopathy, LVEF 20-25%, status post ICD with history of chronic systolic congestive heart failure. The patient presents with one-week history of worsening fluid retention, increasing abdominal girth, shortness of breath, without chest pain. In the emergency room, the patient was noted be in congestive heart failure. EKG revealed paced rhythm. Troponin was negative to 0.03. Review of systems complete and found to be negative unless listed above     Past Medical History  Diagnosis Date  . ASCVD (arteriosclerotic cardiovascular disease)   . Diabetes mellitus   . Hypertension   . History of cocaine abuse   . Tobacco abuse   . Depression   . Suicidal ideation   . Chronic systolic CHF (congestive heart failure) ([place])   . COPD (chronic obstructive pulmonary disease) ([place])   . Coronary artery disease     Past Surgical History  Procedure Laterality Date  . Coronary artery bypass graft  2001  . Lumbar disc surgery  02/1999    Discectomy and fusion  . Vasectomy      Subsequent reversal  . Insert / replace / remove pacemaker    . Cardiac defibrillator placement    . Cholecystectomy N/A 08/30/2015    Procedure: LAPAROSCOPIC CHOLECYSTECTOMY;  Surgeon: Hubbard Robinson, MD;  Location: [name];  Service: General;  Laterality: N/A;  . Cardiac defibrillator placement      Prescriptions prior to admission  Medication Sig Dispense Refill Last Dose  . acetaminophen (TYLENOL) 500 MG tablet Take 1,000 mg by mouth every 6 (six) hours as needed for mild pain or headache.    Taking  . albuterol (PROVENTIL HFA;VENTOLIN HFA) 108 (90 BASE) MCG/ACT inhaler Inhale 2 puffs into the lungs every 6 (six) hours as needed for wheezing or shortness of breath.   Taking  . aspirin EC 81 MG tablet Take 81 mg by mouth daily.   Taking  . atorvastatin (LIPITOR) 20 MG tablet Take 20 mg by mouth every evening.   Taking  . baclofen (LIORESAL) 10 MG tablet Take 10 mg by mouth 3 (three) times daily as needed for muscle spasms.    Taking  . benazepril (LOTENSIN) 5 MG tablet Take 1 tablet (5 mg total) by mouth daily. 30 tablet 0 Taking  . digoxin (LANOXIN) 0.25 MG tablet Take 1 tablet (0.25 mg total) by mouth daily. 30 tablet 2 Taking  . escitalopram (LEXAPRO) 20 MG tablet Take 1 tablet (20 mg total) by mouth daily. 30 tablet 0 Taking  . furosemide (LASIX) 40 MG tablet Take 1 tablet (40 mg total) by mouth 2 (two) times daily. Pt takes two tablets in the morning and one in the evening. (Patient taking differently: Take 40 mg by mouth 2 (two) times daily. Pt takes 1 tablets in the morning and one in the evening.) 60 tablet 0 Taking  . insulin aspart (NOVOLOG) 100 UNIT/ML injection Inject 10 Units into the skin 3 (three) times daily before meals. 10 mL 11 Taking  . insulin glargine (LANTUS) 100 UNIT/ML injection Inject 30 Units into the skin at bedtime.   Taking  . metFORMIN (GLUCOPHAGE) 1000 MG tablet Take 1 tablet (1,000 mg total) by  mouth 2 (two) times daily. 60 tablet 0 Taking  . metoprolol succinate (TOPROL-XL) 100 MG 24 hr tablet Take 100 mg by mouth every evening.   Taking  . oxyCODONE-acetaminophen (PERCOCET) 7.5-325 MG tablet Take 1 tablet by mouth every 8 (eight) hours as needed for severe pain. 30 tablet 0 Taking  . polyethylene glycol (MIRALAX / GLYCOLAX) packet Take 17 g by mouth daily. 14 each 0 Taking  . potassium chloride SA (K-DUR,KLOR-CON) 20 MEQ tablet Take 1 tablet (20 mEq total) by mouth 2 (two) times daily.   Taking   Social History   Social History  . Marital Status:  Single    Spouse Name: N/A  . Number of Children: N/A  . Years of Education: N/A   Occupational History  . Retired   . Truck driver    Social History Main Topics  . Smoking status: Former Smoker -- 1.50 packs/day for 23 years    Types: Cigarettes    Quit date: 08/27/2015  . Smokeless tobacco: Never Used  . Alcohol Use: No  . Drug Use: No     Comment: Former  . Sexual Activity: Not on file   Other Topics Concern  . Not on file   Social History Narrative   Divorced with one child   No regular exercise    Family History  Problem Relation Age of Onset  . CAD Father   . Diabetes Father   . Heart failure Father       Review of systems complete and found to be negative unless listed above      PHYSICAL EXAM  General: Well developed, well nourished, in no acute distress HEENT:  Normocephalic and atramatic Neck:  No JVD.  Lungs: Clear bilaterally to auscultation and percussion. Heart: HRRR . Normal S1 and S2 without gallops or murmurs.  Abdomen: Bowel sounds are positive, abdomen soft and non-tender  Msk:  Back normal, normal gait. Normal strength and tone for age. Extremities: No clubbing, cyanosis or edema.   Neuro: Alert and oriented X 3. Psych:  Good affect, responds appropriately  Labs:   Lab Results  Component Value Date   WBC 6.8 01/13/2016   HGB 13.0 01/13/2016   HCT 39.8* 01/13/2016   MCV 88.6 01/13/2016   PLT 183 01/13/2016    Recent Labs Lab 01/12/16 1922  01/13/16 0652  NA 136  --  135  K 3.1*  --  3.2*  CL 92*  --  92*  CO2 37*  --  36*  BUN 41*  --  37*  CREATININE 1.15  < > 1.35*  CALCIUM 8.7*  --  8.5*  PROT 6.7  --   --   BILITOT 0.8  --   --   ALKPHOS 84  --   --   ALT 12*  --   --   AST 20  --   --   GLUCOSE 254*  --  145*  < > = values in this interval not displayed. Lab Results  Component Value Date   CKTOTAL 40* 10/20/2015   CKMB 2.7 08/29/2015   TROPONINI 0.04* 01/13/2016    Lab Results  Component Value Date   CHOL  153 08/15/2013   CHOL 124 09/07/2012   CHOL 179 07/30/2009   CHOL 179 07/30/2009   Lab Results  Component Value Date   HDL 37* 08/15/2013   HDL 23* 09/07/2012   HDL 36 07/30/2009   HDL 36 07/30/2009   Lab Results  Component Value  Date   LDLCALC 89 08/15/2013   LDLCALC 50 09/07/2012   LDLCALC 99 07/30/2009   LDLCALC 99 07/30/2009   Lab Results  Component Value Date   TRIG 133 08/15/2013   TRIG 253* 09/07/2012   TRIG 218 07/30/2009   TRIG 218 07/30/2009   No results found for: CHOLHDL No results found for: LDLDIRECT    Radiology: Dg Chest 2 View  01/12/2016  CLINICAL DATA:  Shortness of Breath EXAM: CHEST  2 VIEW COMPARISON:  Chest radiograph October 27, 2015 and chest CT October 29, 2015 FINDINGS: There is no appreciable edema or consolidation. L heart is slightly enlarged with pulmonary vascularity within normal limits. Patient is status post coronary artery bypass grafting. Pacemaker leads are attached to the right atrium, right ventricle, and left ventricle. No adenopathy evident. There is mild degenerative change in the thoracic spine. IMPRESSION: Mild cardiac enlargement.  No edema or consolidation. Electronically Signed   By: Lowella Grip III M.D.   On: 01/12/2016 19:52   US Abdomen Limited  01/13/2016  CLINICAL DATA:  60 year old with generalized abdominal pain. Abdominal distension. Evaluate for lower quadrant ascites. EXAM: LIMITED ABDOMEN ULTRASOUND FOR ASCITES TECHNIQUE: Limited ultrasound survey for ascites was performed in all four abdominal quadrants. COMPARISON:  10/30/2015 FINDINGS: Moderate amount of simple-appearing fluid around the liver. Small amount of ascites in the right lower quadrant. Large amount of ascites in the left upper quadrant and left lower quadrant. IMPRESSION: Abdominal ascites, largest amount of fluid is located in the left abdomen. Electronically Signed   By: Markus Daft M.D.   On: 01/13/2016 10:40    EKG: Paced rhythm  ASSESSMENT AND  PLAN:   1. Acute on chronic systolic congestive heart failure with known history of ischemic cardiomyopathy, improved after initial diuresis 2. CABG 4, currently without chest pain,  Recommendations  1. Agree with current therapy 2. Defer full dose anticoagulation 3. Continue diuresis 4. Defer further cardiac diagnostics at this time   Signed: Kooper Godshall MD,PhD, [facility] 01/13/2016, 12:08 PM

## 2016-01-13 NOTE — Plan of Care (Signed)
Problem: Education: Goal: Ability to demonstrate managment of disease process will improve Handouts provided to patient, to education him about his disease.

## 2016-01-14 LAB — GLUCOSE, CAPILLARY
GLUCOSE-CAPILLARY: 129 mg/dL — AB (ref 65–99)
GLUCOSE-CAPILLARY: 155 mg/dL — AB (ref 65–99)
GLUCOSE-CAPILLARY: 228 mg/dL — AB (ref 65–99)
Glucose-Capillary: 272 mg/dL — ABNORMAL HIGH (ref 65–99)

## 2016-01-14 LAB — BASIC METABOLIC PANEL
ANION GAP: 8 (ref 5–15)
BUN: 41 mg/dL — AB (ref 6–20)
CALCIUM: 8 mg/dL — AB (ref 8.9–10.3)
CO2: 35 mmol/L — AB (ref 22–32)
CREATININE: 1.35 mg/dL — AB (ref 0.61–1.24)
Chloride: 91 mmol/L — ABNORMAL LOW (ref 101–111)
GFR calc Af Amer: >60 mL/min (ref >60)
GFR, EST NON AFRICAN AMERICAN: 56 mL/min — AB (ref >60)
GLUCOSE: 189 mg/dL — AB (ref 65–99)
Potassium: 3.2 mmol/L — ABNORMAL LOW (ref 3.5–5.1)
Sodium: 134 mmol/L — ABNORMAL LOW (ref 135–145)

## 2016-01-14 MED ORDER — FUROSEMIDE 10 MG/ML IJ SOLN: 20.0000 mg | Freq: Two times a day (BID) | INTRAMUSCULAR | Status: DC

## 2016-01-14 MED ORDER — FUROSEMIDE 10 MG/ML IJ SOLN
20.0000 mg | Freq: Two times a day (BID) | INTRAMUSCULAR | Status: DC
Start: 2016-01-14 — End: 2016-01-18
  Administered 2016-01-14 – 2016-01-18 (×9): 20 mg via INTRAVENOUS
  Filled 2016-01-14 (×9): qty 2

## 2016-01-14 MED ORDER — ENOXAPARIN SODIUM 40 MG/0.4ML ~~LOC~~ SOLN
40.0000 mg | SUBCUTANEOUS | Status: DC
  Administered 2016-01-14 – 2016-01-17 (×4): 40 mg via SUBCUTANEOUS
  Filled 2016-01-14 (×4): qty 0.4

## 2016-01-14 NOTE — Progress Notes (Signed)
Heart Failure Clinic appointment scheduled for January 26, 2016 at 9:30am. Of note, he did not show for his last scheduled appointment on 12/05/15. Thank you

## 2016-01-14 NOTE — Care Management Important Message (Signed)
Important Message  Patient Details  Name: Ronald Miller. MRN: [id number] Date of Birth: 1956/07/13   Medicare Important Message Given:  Yes    Juliann Pulse A Brigette Hopfer 01/14/2016, 1:50 PM

## 2016-01-14 NOTE — Progress Notes (Signed)
[place] [facility] [place] NAME: Ronald Miller    MR#:  [id number]  DATE OF BIRTH:  [birth year]  SUBJECTIVE:  CHIEF COMPLAINT:   Chief Complaint  Patient presents with  . Shortness of Breath   - complains of dyspnea, and abdominal distention - no fevers or chills. Feels weak.  REVIEW OF SYSTEMS:  Review of Systems  Constitutional: Negative for fever and chills.  Respiratory: Positive for shortness of breath. Negative for cough and wheezing.   Cardiovascular: Positive for leg swelling. Negative for chest pain and palpitations.  Gastrointestinal: Positive for abdominal pain. Negative for nausea, vomiting, diarrhea and constipation.       Abdominal distention  Genitourinary: Negative for dysuria and urgency.  Musculoskeletal: Negative for myalgias.  Neurological: Negative for dizziness, sensory change, speech change, focal weakness, seizures and headaches.  Psychiatric/Behavioral: Negative for depression.    DRUG ALLERGIES:   Allergies  Allergen Reactions  . Codeine Nausea And Vomiting    VITALS:  Blood pressure 108/71, pulse 78, temperature 97.6 F (36.4 C), temperature source Oral, resp. rate 18, height 5\' 9"  (1.753 m), weight 109.317 kg (241 lb), SpO2 93 %.  PHYSICAL EXAMINATION:  Physical Exam  GENERAL:  60 y.o.-year-old obese patient lying in the bed with no acute distress.  EYES: Pupils equal, round, reactive to light and accommodation. No scleral icterus. Extraocular muscles intact.  HEENT: Head atraumatic, normocephalic. Oropharynx and nasopharynx clear.  NECK:  Supple, no jugular venous distention. No thyroid enlargement, no tenderness.  LUNGS: Normal breath sounds bilaterally, no wheezing, rhonchi. Fine rales at the bases. No use of accessory muscles of respiration.  CARDIOVASCULAR: S1, S2 normal. No rubs, or gallops. 3/6 systolic murmur ABDOMEN: distended abdomen, softer today. Bowel sounds present. No organomegaly or  mass.  EXTREMITIES: No cyanosis, or clubbing. 2+ pedal edema NEUROLOGIC: Cranial nerves II through XII are intact. Muscle strength 5/5 in all extremities. Sensation intact. Gait not checked.  PSYCHIATRIC: The patient is alert and oriented x 3.  SKIN: No obvious rash, lesion, or ulcer.    LABORATORY PANEL:   CBC  Recent Labs Lab 01/13/16 0652  WBC 6.8  HGB 13.0  HCT 39.8*  PLT 183   ------------------------------------------------------------------------------------------------------------------  Chemistries   Recent Labs Lab 01/12/16 1922  01/14/16 0423  NA 136  < > 134*  K 3.1*  < > 3.2*  CL 92*  < > 91*  CO2 37*  < > 35*  GLUCOSE 254*  < > 189*  BUN 41*  < > 41*  CREATININE 1.15  < > 1.35*  CALCIUM 8.7*  < > 8.0*  AST 20  --   --   ALT 12*  --   --   ALKPHOS 84  --   --   BILITOT 0.8  --   --   < > = values in this interval not displayed. ------------------------------------------------------------------------------------------------------------------  Cardiac Enzymes  Recent Labs Lab 01/13/16 1247  TROPONINI 0.03   ------------------------------------------------------------------------------------------------------------------  RADIOLOGY:  Dg Chest 2 View  01/12/2016  CLINICAL DATA:  Shortness of Breath EXAM: CHEST  2 VIEW COMPARISON:  Chest radiograph October 27, 2015 and chest CT October 29, 2015 FINDINGS: There is no appreciable edema or consolidation. L heart is slightly enlarged with pulmonary vascularity within normal limits. Patient is status post coronary artery bypass grafting. Pacemaker leads are attached to the right atrium, right ventricle, and left ventricle. No adenopathy evident. There is mild degenerative change in the thoracic spine.  IMPRESSION: Mild cardiac enlargement.  No edema or consolidation. Electronically Signed   By: Lowella Grip III M.D.   On: 01/12/2016 19:52   US Abdomen Limited  01/13/2016  CLINICAL DATA:  60 year old with  generalized abdominal pain. Abdominal distension. Evaluate for lower quadrant ascites. EXAM: LIMITED ABDOMEN ULTRASOUND FOR ASCITES TECHNIQUE: Limited ultrasound survey for ascites was performed in all four abdominal quadrants. COMPARISON:  10/30/2015 FINDINGS: Moderate amount of simple-appearing fluid around the liver. Small amount of ascites in the right lower quadrant. Large amount of ascites in the left upper quadrant and left lower quadrant. IMPRESSION: Abdominal ascites, largest amount of fluid is located in the left abdomen. Electronically Signed   By: Markus Daft M.D.   On: 01/13/2016 10:40   US Paracentesis  01/13/2016  INDICATION: Abdominal ascites and abdominal distension. EXAM: ULTRASOUND GUIDED PARACENTESIS MEDICATIONS: None. COMPLICATIONS: None immediate. PROCEDURE: Informed written consent was obtained from the patient after a discussion of the risks, benefits and alternatives to treatment. A timeout was performed prior to the initiation of the procedure. Initial ultrasound scanning demonstrates ascites within the right lower abdominal quadrant. The right lower abdomen was prepped and draped in the usual sterile fashion. 1% lidocaine with epinephrine was used for local anesthesia. Following this, a 6 Fr Safe-T-Centesis catheter was introduced. An ultrasound image was saved for documentation purposes. The paracentesis was performed. The catheter was removed and a dressing was applied. The patient tolerated the procedure well without immediate post procedural complication. FINDINGS: A total of approximately 7.2 L of yellow fluid was removed. IMPRESSION: Successful ultrasound-guided paracentesis yielding 7.2 L liters of peritoneal fluid. Electronically Signed   By: Markus Daft M.D.   On: 01/13/2016 13:51    EKG:   Orders placed or performed during the hospital encounter of 01/12/16  . EKG 12-Lead  . EKG 12-Lead  . ED EKG  . ED EKG    ASSESSMENT AND PLAN:   60 year old male with past  medical history significant for CAD status post CABG, ischemic cardiomyopathy with EF of 25%, diabetes and hypertension presents to the hospital secondary to worsening weight gain and difficulty breathing.  #1 Acute on chronic systolic heart failure exacerbation- continue IV lasix bid- changed to 20mg  IV bid due to soft BP - ECHO with EF 20-25%, diffuse hypokinesis- ischemic cardiomyopathy - appreciate cardiology consult - on digoxin, Toprol and also benazepril. Continue statin  #2 diabetes mellitus-carb-controlled diet. Sliding scale insulin. Low dose lantus. Also on metformin at home which is on hold -Continue to monitor and adjust doses  #3 hypokalemia-replace potassium especially while on Lasix  #4 hypertension- but low normal BP here - continue Lasix, hold benazepril and metoprolol for now  #5 CAD status post CABG-stable at this time. No chest pain. Troponins plateaued. Continue cardiac medications.  #6 ascites-secondary to CHF exacerbation. Abdominal US showing fatty liver too -Ultrasound of the abdomen and drained 7.2L yesterday - patient still feels it is distended. His abdominal pain is periumbilical at his previous surgical scar-? Scar tissue   #7 DVT prophylaxis-on Lovenox.  Continue diuresis for 1 more day Discharge tomorrow if improving    All the records are reviewed and case discussed with Care Management/Social Workerr. Management plans discussed with the patient, family and they are in agreement.  CODE STATUS: Full Code  TOTAL TIME TAKING CARE OF THIS PATIENT: 38 minutes.   POSSIBLE D/C IN 1 DAYS, DEPENDING ON CLINICAL CONDITION.   Gladstone Lighter M.D on 01/14/2016 at 12:42 PM  Between 7am to  6pm - Pager - [phone]  After 6pm go to [email or web address] - password [email or web address] [place]  Office  [phone]  CC: Primary care physician; [facility]

## 2016-01-14 NOTE — Discharge Instructions (Signed)
Heart Failure Clinic appointment on January 26, 2016 at 9:30am with Darylene Price, [place]. Please call [phone] to reschedule.

## 2016-01-14 NOTE — Care Management (Addendum)
Presents from home with acute on chronic CHF. Patient lives alone. History of noncompliance and substance abuse.  Previously with Ronald Miller but his phone broke about 3 weeks ago and he has had no further visits. TC to [place] with Ronald Miller , they have closed patient  He may benefit from resumption of home health services at discharge.  PT consult pending. PCP is at [facility]. He has an appointment at CHF 3/3 but missed that appointment. Will follow for progression and discharge planning.

## 2016-01-14 NOTE — Progress Notes (Signed)
Filed Vitals:   01/14/16 0503 01/14/16 0736  BP: 97/65 98/71  Pulse: 72 73  Temp:    Resp:     MD aware of VS above, RN received orders to decrease IV lasix to 20mg  and discontinue metoprolol and ACE-I.

## 2016-01-15 LAB — BASIC METABOLIC PANEL
ANION GAP: 6 (ref 5–15)
BUN: 31 mg/dL — AB (ref 6–20)
CHLORIDE: 92 mmol/L — AB (ref 101–111)
CO2: 35 mmol/L — AB (ref 22–32)
CREATININE: 0.98 mg/dL (ref 0.61–1.24)
Calcium: 7.9 mg/dL — ABNORMAL LOW (ref 8.9–10.3)
GFR calc non Af Amer: >60 mL/min (ref >60)
Glucose, Bld: 151 mg/dL — ABNORMAL HIGH (ref 65–99)
POTASSIUM: 3.2 mmol/L — AB (ref 3.5–5.1)
Sodium: 133 mmol/L — ABNORMAL LOW (ref 135–145)

## 2016-01-15 LAB — GLUCOSE, CAPILLARY
GLUCOSE-CAPILLARY: 114 mg/dL — AB (ref 65–99)
GLUCOSE-CAPILLARY: 171 mg/dL — AB (ref 65–99)
GLUCOSE-CAPILLARY: 243 mg/dL — AB (ref 65–99)
Glucose-Capillary: 196 mg/dL — ABNORMAL HIGH (ref 65–99)

## 2016-01-15 MED ORDER — POLYETHYLENE GLYCOL 3350 17 G PO PACK
17.0000 g | PACK | Freq: Every day | ORAL | Status: DC
  Administered 2016-01-15 – 2016-01-16 (×2): 17 g via ORAL
  Filled 2016-01-15 (×3): qty 1

## 2016-01-15 MED ORDER — POTASSIUM CHLORIDE CRYS ER 20 MEQ PO TBCR
40.0000 meq | EXTENDED_RELEASE_TABLET | Freq: Once | ORAL | Status: AC
  Administered 2016-01-15: 40 meq via ORAL
  Filled 2016-01-15: qty 2

## 2016-01-15 MED ORDER — DOCUSATE SODIUM 100 MG PO CAPS
100.0000 mg | ORAL_CAPSULE | Freq: Two times a day (BID) | ORAL | Status: DC
  Administered 2016-01-15 – 2016-01-17 (×5): 100 mg via ORAL
  Filled 2016-01-15 (×6): qty 1

## 2016-01-15 MED ORDER — ZOLPIDEM TARTRATE 5 MG PO TABS
5.0000 mg | ORAL_TABLET | Freq: Every evening | ORAL | Status: DC | PRN
Start: 2016-01-15 — End: 2016-01-18
  Administered 2016-01-16 – 2016-01-17 (×3): 5 mg via ORAL
  Filled 2016-01-15 (×3): qty 1

## 2016-01-15 NOTE — Evaluation (Signed)
Physical Therapy Evaluation Patient Details Name: Ronald Miller. MRN: [id number] DOB: Feb 01, 1956 Today's Date: 01/15/2016   History of Present Illness  presented to ER secondary to 1 week history of progressively worsening SOB, abdominal and LE edema; admitted with CHF exacerbation.  Status post paracentesis (4/11) with 7.2L fluid removal.  currently on RA, maintaining sats >92% at rest and with exertion.  Clinical Impression  Upon evaluation, patient alert and oriented; follows all commands and demonstrates good safety awareness/insight.  Bilat UE/LE strength and ROM grossly WFL and symmetrical; minimal extremity edema, pain or sensory deficit reported.  Does appear somewhat taut/distended throughout abdomen, feels "like the fluid is already coming back". Able to complete sit/stand, basic transfers and gait (120') without assist device, mod indep. No buckling, LOB or safety concern; maintains vitals stable and WFL on RA throughout session.  Did instruct patient in role of activity pacing and energy conservation; patient voiced agreement/understanding. Patient appears to be at baseline level of functional ability, without acute PT needs.  Did encourage continued, progressive mobility throughout unit during remaining hospitalization; patient voiced understanding.  Will complete initial order at this time; please re-consult should needs change.     Follow Up Recommendations No PT follow up (may be appropriate for cardiac/pulmonary rehab upon discharge per physician order)    Equipment Recommendations       Recommendations for Other Services       Precautions / Restrictions Precautions Precautions: None Restrictions Weight Bearing Restrictions: No      Mobility  Bed Mobility               General bed mobility comments: seated in recliner beginning/end of session  Transfers Overall transfer level: Needs assistance Equipment used: None Transfers: Sit to/from Stand Sit to  Stand: Modified independent (Device/Increase time)            Ambulation/Gait Ambulation/Gait assistance: Modified independent (Device/Increase time) Ambulation Distance (Feet): 120 Feet Assistive device: None       General Gait Details: reciprocal stepping pattern with fair step height/length, mild lateral sway (due to body habitus), but no buckling, LOB or safety concern.  Vitals stable and WFL on RA  Stairs Stairs:  (patient declined stair assessment, stating he feels comfortable with mobility performance)          Wheelchair Mobility    Modified Rankin (Stroke Patients Only)       Balance Overall balance assessment: Needs assistance Sitting-balance support: No upper extremity supported;Feet supported Sitting balance-Leahy Scale: Good     Standing balance support: No upper extremity supported Standing balance-Leahy Scale: Good                               Pertinent Vitals/Pain Pain Assessment: No/denies pain (mild reports of discomfort in lower abdomen with gait efforts, unrated)    Home Living Family/patient expects to be discharged to:: Private residence Living Arrangements: Alone   Type of Home: House Home Access: Stairs to enter Entrance Stairs-Rails: Right Entrance Stairs-Number of Steps: 5 Home Layout: One level Home Equipment: None      Prior Function Level of Independence: Independent         Comments: does not drive, but reports he can walk for grocery shopping. etc (per previous records); denies fall history.     Hand Dominance        Extremity/Trunk Assessment   Upper Extremity Assessment: Overall WFL for tasks assessed  Lower Extremity Assessment: Overall WFL for tasks assessed (grossly at least 4+/5)         Communication   Communication: No difficulties  Cognition Arousal/Alertness: Awake/alert Behavior During Therapy: WFL for tasks assessed/performed Overall Cognitive Status: Within  Functional Limits for tasks assessed                      General Comments      Exercises        Assessment/Plan    PT Assessment Patent does not need any further PT services  PT Diagnosis     PT Problem List    PT Treatment Interventions     PT Goals (Current goals can be found in the Care Plan section) Acute Rehab PT Goals Patient Stated Goal: to return home PT Goal Formulation: All assessment and education complete, DC therapy    Frequency     Barriers to discharge        Co-evaluation               End of Session Equipment Utilized During Treatment: Gait belt Activity Tolerance: Patient tolerated treatment well Patient left: in chair;with call bell/phone within reach Nurse Communication: Mobility status         Time: [id number] PT Time Calculation (min) (ACUTE ONLY): 12 min   Charges:   PT Evaluation $PT Eval Low Complexity: 1 Procedure     PT G Codes:        Yameli Delamater H. Owens Shark, PT, DPT, NCS 01/15/2016, 4:25 PM [phone]

## 2016-01-15 NOTE — Progress Notes (Signed)
[facility] Cardiology  SUBJECTIVE: I'm breathing better   Filed Vitals:   01/14/16 1135 01/14/16 1930 01/15/16 0445 01/15/16 0446  BP: 108/71 96/62 82/52  98/72  Pulse: 78 81 75 76  Temp: 97.6 F (36.4 C) 98.4 F (36.9 C) 97.5 F (36.4 C)   TempSrc:  Oral Oral   Resp: 18 16 16    Height:      Weight:   111.63 kg (246 lb 1.6 oz)   SpO2: 93% 93% 96% 92%     Intake/Output Summary (Last 24 hours) at 01/15/16 [id number] Last data filed at 01/14/16 2359  Gross per 24 hour  Intake    720 ml  Output   2200 ml  Net  -1480 ml      PHYSICAL EXAM  General: Well developed, well nourished, in no acute distress HEENT:  Normocephalic and atramatic Neck:  No JVD.  Lungs: Clear bilaterally to auscultation and percussion. Heart: HRRR . Normal S1 and S2 without gallops or murmurs.  Abdomen: Increased abdominal girth Msk:  Back normal, normal gait. Normal strength and tone for age. Extremities: No clubbing, cyanosis or edema.   Neuro: Alert and oriented X 3. Psych:  Good affect, responds appropriately   LABS: Basic Metabolic Panel:  Recent Labs  01/14/16 0423 01/15/16 0514  NA 134* 133*  K 3.2* 3.2*  CL 91* 92*  CO2 35* 35*  GLUCOSE 189* 151*  BUN 41* 31*  CREATININE 1.35* 0.98  CALCIUM 8.0* 7.9*   Liver Function Tests:  Recent Labs  01/12/16 1922  AST 20  ALT 12*  ALKPHOS 84  BILITOT 0.8  PROT 6.7  ALBUMIN 3.5   No results for input(s): LIPASE, AMYLASE in the last 72 hours. CBC:  Recent Labs  01/13/16 0138 01/13/16 0652  WBC 7.1 6.8  HGB 12.0* 13.0  HCT 36.0* 39.8*  MCV 88.0 88.6  PLT 171 183   Cardiac Enzymes:  Recent Labs  01/13/16 0138 01/13/16 0652 01/13/16 1247  TROPONINI 0.03 0.04* 0.03   BNP: Invalid input(s): POCBNP D-Dimer: No results for input(s): DDIMER in the last 72 hours. Hemoglobin A1C:  Recent Labs  01/13/16 0138  HGBA1C 8.4*   Fasting Lipid Panel: No results for input(s): CHOL, HDL, LDLCALC, TRIG, CHOLHDL, LDLDIRECT in the last  72 hours. Thyroid Function Tests: No results for input(s): TSH, T4TOTAL, T3FREE, THYROIDAB in the last 72 hours.  Invalid input(s): FREET3 Anemia Panel: No results for input(s): VITAMINB12, FOLATE, FERRITIN, TIBC, IRON, RETICCTPCT in the last 72 hours.  US Abdomen Limited  01/13/2016  CLINICAL DATA:  60 year old with generalized abdominal pain. Abdominal distension. Evaluate for lower quadrant ascites. EXAM: LIMITED ABDOMEN ULTRASOUND FOR ASCITES TECHNIQUE: Limited ultrasound survey for ascites was performed in all four abdominal quadrants. COMPARISON:  10/30/2015 FINDINGS: Moderate amount of simple-appearing fluid around the liver. Small amount of ascites in the right lower quadrant. Large amount of ascites in the left upper quadrant and left lower quadrant. IMPRESSION: Abdominal ascites, largest amount of fluid is located in the left abdomen. Electronically Signed   By: Markus Daft M.D.   On: 01/13/2016 10:40   US Paracentesis  01/13/2016  INDICATION: Abdominal ascites and abdominal distension. EXAM: ULTRASOUND GUIDED PARACENTESIS MEDICATIONS: None. COMPLICATIONS: None immediate. PROCEDURE: Informed written consent was obtained from the patient after a discussion of the risks, benefits and alternatives to treatment. A timeout was performed prior to the initiation of the procedure. Initial ultrasound scanning demonstrates ascites within the right lower abdominal quadrant. The right lower abdomen was prepped  and draped in the usual sterile fashion. 1% lidocaine with epinephrine was used for local anesthesia. Following this, a 6 Fr Safe-T-Centesis catheter was introduced. An ultrasound image was saved for documentation purposes. The paracentesis was performed. The catheter was removed and a dressing was applied. The patient tolerated the procedure well without immediate post procedural complication. FINDINGS: A total of approximately 7.2 L of yellow fluid was removed. IMPRESSION: Successful  ultrasound-guided paracentesis yielding 7.2 L liters of peritoneal fluid. Electronically Signed   By: Markus Daft M.D.   On: 01/13/2016 13:51     Echo   TELEMETRY: Paced rhythm:  ASSESSMENT AND PLAN:  Principal Problem:   Acute on chronic systolic heart failure (HCC) Active Problems:   Type 2 diabetes mellitus (HCC)   COPD (chronic obstructive pulmonary disease) (HCC)   HTN (hypertension)   Arteriosclerosis of coronary artery   HLD (hyperlipidemia)   Acute on chronic systolic CHF (congestive heart failure) ([place])    1. Acute on chronic systolic congestive heart failure, slowly improving 2. CABG 4, no chest pain  Recommendations  1. Agree with current therapy 2. Continue diuresis 3. Closely monitor renal status 4. Defer further cardiac diagnostics at this time  Signed off for now, please call if any questions   Devon Pretty, MD, PhD, [facility] 01/15/2016 8:19 AM

## 2016-01-15 NOTE — Progress Notes (Signed)
Inpatient Diabetes Program Recommendations  AACE/ADA: New Consensus Statement on Inpatient Glycemic Control (2015)  Target Ranges:  Prepandial:   less than 140 mg/dL      Peak postprandial:   less than 180 mg/dL (1-2 hours)      Critically ill patients:  140 - 180 mg/dL  Results for Ronald Miller, Ronald Miller (MRN [id number]) as of 01/15/2016 12:47  Ref. Range 01/14/2016 07:34 01/14/2016 11:34 01/14/2016 17:01 01/14/2016 21:13 01/15/2016 07:38 01/15/2016 11:17  Glucose-Capillary Latest Ref Range: 65-99 mg/dL 129 (H) 272 (H) 155 (H) 228 (H) 114 (H) 171 (H)   Review of Glycemic Control  Diabetes history: DM2 Outpatient Diabetes medications: Lantus 30 units QHS, Novolog 10 units TID with meals, Metformin 1000 mg BID Current orders for Inpatient glycemic control: Lantus 17 units daily, Novolog 0-9 units TID with meals, Novolog 0-5 units QHS  Inpatient Diabetes Program Recommendations: Insulin - Meal Coverage: If patient is eating at least 50% of meals, please consider ordering Novolog 3 units TID with meals for meal coverage.  Thanks, Barnie Alderman, RN, MSN, CDE Diabetes Coordinator Inpatient Diabetes Program [phone] (Team Pager from [place] to [place]) [phone] ([facility] office) [phone] [facility] office) [phone] [facility] office)

## 2016-01-15 NOTE — Progress Notes (Signed)
[place] at [place] NAME: Kymani Hardt    MRN#:  [id number]  DATE OF BIRTH:  [birth year]  SUBJECTIVE:  Hospital Day: 3 days Cowan Solazzo is a 60 y.o. male presenting with Shortness of Breath .   Overnight events: No overnight events Interval Events: States improvement in symptoms overall breathing is better able to lie down flat however abdomen feeling distended and tight  REVIEW OF SYSTEMS:  CONSTITUTIONAL: No fever, fatigue or weakness.  EYES: No blurred or double vision.  EARS, NOSE, AND THROAT: No tinnitus or ear pain.  RESPIRATORY: No cough, shortness of breath, wheezing or hemoptysis.  CARDIOVASCULAR: No chest pain, orthopnea, edema.  GASTROINTESTINAL: No nausea, vomiting, diarrhea or abdominal pain.  GENITOURINARY: No dysuria, hematuria.  ENDOCRINE: No polyuria, nocturia,  HEMATOLOGY: No anemia, easy bruising or bleeding SKIN: No rash or lesion. MUSCULOSKELETAL: No joint pain or arthritis.   NEUROLOGIC: No tingling, numbness, weakness.  PSYCHIATRY: No anxiety or depression.   DRUG ALLERGIES:   Allergies  Allergen Reactions  . Codeine Nausea And Vomiting    VITALS:  Blood pressure 104/82, pulse 81, temperature 98.2 F (36.8 C), temperature source Oral, resp. rate 12, height 5\' 9"  (1.753 m), weight 111.63 kg (246 lb 1.6 oz), SpO2 95 %.  PHYSICAL EXAMINATION:  VITAL SIGNS: Filed Vitals:   01/15/16 0446 01/15/16 1129  BP: 98/72 104/82  Pulse: 76 81  Temp:  98.2 F (36.8 C)  Resp:  12   GENERAL:60 y.o.male currently in no acute distress.  HEAD: Normocephalic, atraumatic.  EYES: Pupils equal, round, reactive to light. Extraocular muscles intact. No scleral icterus.  MOUTH: Moist mucosal membrane. Dentition intact. No abscess noted.  EAR, NOSE, THROAT: Clear without exudates. No external lesions.  NECK: Supple. No thyromegaly. No nodules. No JVD.  PULMONARY: Scant rhonchi otherwise Clear to ascultation, without  wheeze rails No use of accessory muscles, Good respiratory effort. good air entry bilaterally CHEST: Nontender to palpation.  CARDIOVASCULAR: S1 and S2. Regular rate and rhythm. No murmurs, rubs, or gallops. No edema. Pedal pulses 2+ bilaterally.  GASTROINTESTINAL: Tense, nontender, distended. No masses. Positive bowel sounds. No hepatosplenomegaly.  MUSCULOSKELETAL: No swelling, clubbing, or edema. Range of motion full in all extremities.  NEUROLOGIC: Cranial nerves II through XII are intact. No gross focal neurological deficits. Sensation intact. Reflexes intact.  SKIN: No ulceration, lesions, rashes, or cyanosis. Skin warm and dry. Turgor intact.  PSYCHIATRIC: Mood, affect within normal limits. The patient is awake, alert and oriented x 3. Insight, judgment intact.      LABORATORY PANEL:   CBC  Recent Labs Lab 01/13/16 0652  WBC 6.8  HGB 13.0  HCT 39.8*  PLT 183   ------------------------------------------------------------------------------------------------------------------  Chemistries   Recent Labs Lab 01/12/16 1922  01/15/16 0514  NA 136  < > 133*  K 3.1*  < > 3.2*  CL 92*  < > 92*  CO2 37*  < > 35*  GLUCOSE 254*  < > 151*  BUN 41*  < > 31*  CREATININE 1.15  < > 0.98  CALCIUM 8.7*  < > 7.9*  AST 20  --   --   ALT 12*  --   --   ALKPHOS 84  --   --   BILITOT 0.8  --   --   < > = values in this interval not displayed. ------------------------------------------------------------------------------------------------------------------  Cardiac Enzymes  Recent Labs Lab 01/13/16 1247  TROPONINI 0.03   ------------------------------------------------------------------------------------------------------------------  RADIOLOGY:  No results found.  EKG:   Orders placed or performed during the hospital encounter of 01/12/16  . EKG 12-Lead  . EKG 12-Lead  . ED EKG  . ED EKG    ASSESSMENT AND PLAN:   Ronald Miller is a 60 y.o. male presenting with  Shortness of Breath . Admitted 01/12/2016 : Day #: 3 days   1 Acute on chronic systolic heart failure exacerbation- continue IV lasix bid-- ECHO with EF 20-25%, diffuse hypokinesis- ischemic cardiomyopathy - appreciate cardiology consult - on digoxin, Toprol and also benazepril. Continue statin  2 diabetes mellitus-carb-controlled diet. Sliding scale insulin. Low dose lantus. Also on metformin at home which is on hold -Continue to monitor and adjust doses  3 hypokalemia-replace potassium especially while on Lasix  4 hypertension- but low normal BP here - continue Lasix, hold benazepril and metoprolol for now  5 CAD status post CABG-stable at this time. No chest pain. Troponins plateaued. Continue cardiac medications.  6 ascites-secondary to CHF exacerbation. Abdominal US showing fatty liver too -7.2L paracentesis - patient still feels it is distended.   7 DVT prophylaxis-on Lovenox.   All the records are reviewed and case discussed with Care Management/Social Workerr. Management plans discussed with the patient, family and they are in agreement.  CODE STATUS: full TOTAL TIME TAKING CARE OF THIS PATIENT: 28 minutes.   POSSIBLE D/C IN 1-2DAYS, DEPENDING ON CLINICAL CONDITION.   Hower,  Karenann Cai.D on 01/15/2016 at 12:12 PM  Between 7am to 6pm - Pager - [phone]  After 6pm: House Pager: - [place] [facility]  Office  [phone]  CC: Primary care physician; [facility]

## 2016-01-16 LAB — BASIC METABOLIC PANEL
Anion gap: 1 — ABNORMAL LOW (ref 5–15)
BUN: 22 mg/dL — AB (ref 6–20)
CHLORIDE: 95 mmol/L — AB (ref 101–111)
CO2: 41 mmol/L — ABNORMAL HIGH (ref 22–32)
Calcium: 8.2 mg/dL — ABNORMAL LOW (ref 8.9–10.3)
Creatinine, Ser: 0.9 mg/dL (ref 0.61–1.24)
GFR calc Af Amer: >60 mL/min (ref >60)
Glucose, Bld: 122 mg/dL — ABNORMAL HIGH (ref 65–99)
POTASSIUM: 3.7 mmol/L (ref 3.5–5.1)
Sodium: 137 mmol/L (ref 135–145)

## 2016-01-16 LAB — GLUCOSE, CAPILLARY
GLUCOSE-CAPILLARY: 124 mg/dL — AB (ref 65–99)
GLUCOSE-CAPILLARY: 149 mg/dL — AB (ref 65–99)
GLUCOSE-CAPILLARY: 170 mg/dL — AB (ref 65–99)
Glucose-Capillary: 177 mg/dL — ABNORMAL HIGH (ref 65–99)

## 2016-01-16 MED ORDER — POTASSIUM CHLORIDE CRYS ER 20 MEQ PO TBCR
40.0000 meq | EXTENDED_RELEASE_TABLET | Freq: Two times a day (BID) | ORAL | Status: DC
  Administered 2016-01-16 – 2016-01-18 (×4): 40 meq via ORAL
  Filled 2016-01-16 (×4): qty 2

## 2016-01-16 MED ORDER — INSULIN ASPART 100 UNIT/ML ~~LOC~~ SOLN
3.0000 [IU] | Freq: Three times a day (TID) | SUBCUTANEOUS | Status: DC
  Administered 2016-01-16 – 2016-01-17 (×4): 3 [IU] via SUBCUTANEOUS
  Filled 2016-01-16 (×4): qty 3

## 2016-01-16 NOTE — Care Management Important Message (Signed)
Important Message  Patient Details  Name: Ronald Miller. MRN: [id number] Date of Birth: [birth year]   Medicare Important Message Given:  Yes    [name] 01/16/2016, 10:23 AM

## 2016-01-16 NOTE — Progress Notes (Signed)
[place] at [place] NAME: Ronald Miller    MRN#:  [id number]  DATE OF BIRTH:  1956-07-02  SUBJECTIVE:  Hospital Day: 4 days Ronald Miller is a 60 y.o. male presenting with Shortness of Breath .   Overnight events: No overnight events Interval Events: States improvement in symptoms overall breathing is better able to lie down flat however abdomen feeling distended and tight  REVIEW OF SYSTEMS:  CONSTITUTIONAL: No fever, fatigue or weakness.  EYES: No blurred or double vision.  EARS, NOSE, AND THROAT: No tinnitus or ear pain.  RESPIRATORY: No cough, shortness of breath, wheezing or hemoptysis.  CARDIOVASCULAR: No chest pain, orthopnea, edema.  GASTROINTESTINAL: No nausea, vomiting, diarrhea or abdominal pain.  GENITOURINARY: No dysuria, hematuria.  ENDOCRINE: No polyuria, nocturia,  HEMATOLOGY: No anemia, easy bruising or bleeding SKIN: No rash or lesion. MUSCULOSKELETAL: No joint pain or arthritis.   NEUROLOGIC: No tingling, numbness, weakness.  PSYCHIATRY: No anxiety or depression.   DRUG ALLERGIES:   Allergies  Allergen Reactions  . Codeine Nausea And Vomiting    VITALS:  Blood pressure 95/60, pulse 81, temperature 98 F (36.7 C), temperature source Oral, resp. rate 18, height 5\' 9"  (1.753 m), weight 105.235 kg (232 lb), SpO2 92 %.  PHYSICAL EXAMINATION:  VITAL SIGNS: Filed Vitals:   01/16/16 0533 01/16/16 1102  BP: 103/62 95/60  Pulse: 86 81  Temp: 97.9 F (36.6 C) 98 F (36.7 C)  Resp: 16 18   GENERAL:60 y.o.male currently in no acute distress.  HEAD: Normocephalic, atraumatic.  EYES: Pupils equal, round, reactive to light. Extraocular muscles intact. No scleral icterus.  MOUTH: Moist mucosal membrane. Dentition intact. No abscess noted.  EAR, NOSE, THROAT: Clear without exudates. No external lesions.  NECK: Supple. No thyromegaly. No nodules. No JVD.  PULMONARY: Scant rhonchi otherwise Clear to ascultation,  without wheeze rails No use of accessory muscles, Good respiratory effort. good air entry bilaterally CHEST: Nontender to palpation.  CARDIOVASCULAR: S1 and S2. Regular rate and rhythm. No murmurs, rubs, or gallops. No edema. Pedal pulses 2+ bilaterally.  GASTROINTESTINAL: Tense, nontender, distended. No masses. Positive bowel sounds. No hepatosplenomegaly.  MUSCULOSKELETAL: No swelling, clubbing, or edema. Range of motion full in all extremities.  NEUROLOGIC: Cranial nerves II through XII are intact. No gross focal neurological deficits. Sensation intact. Reflexes intact.  SKIN: No ulceration, lesions, rashes, or cyanosis. Skin warm and dry. Turgor intact.  PSYCHIATRIC: Mood, affect within normal limits. The patient is awake, alert and oriented x 3. Insight, judgment intact.      LABORATORY PANEL:   CBC  Recent Labs Lab 01/13/16 0652  WBC 6.8  HGB 13.0  HCT 39.8*  PLT 183   ------------------------------------------------------------------------------------------------------------------  Chemistries   Recent Labs Lab 01/12/16 1922  01/16/16 0800  NA 136  < > 137  K 3.1*  < > 3.7  CL 92*  < > 95*  CO2 37*  < > 41*  GLUCOSE 254*  < > 122*  BUN 41*  < > 22*  CREATININE 1.15  < > 0.90  CALCIUM 8.7*  < > 8.2*  AST 20  --   --   ALT 12*  --   --   ALKPHOS 84  --   --   BILITOT 0.8  --   --   < > = values in this interval not displayed. ------------------------------------------------------------------------------------------------------------------  Cardiac Enzymes  Recent Labs Lab 01/13/16 1247  TROPONINI 0.03   ------------------------------------------------------------------------------------------------------------------  RADIOLOGY:  No results found.  EKG:   Orders placed or performed during the hospital encounter of 01/12/16  . EKG 12-Lead  . EKG 12-Lead  . ED EKG  . ED EKG    ASSESSMENT AND PLAN:   Ronald Miller is a 60 y.o. male presenting with  Shortness of Breath . Admitted 01/12/2016 : Day #: 4 days   1 Acute on chronic systolic heart failure exacerbation- continue IV lasix bid-- ECHO with EF 20-25%, diffuse hypokinesis- ischemic cardiomyopathy - appreciate cardiology consult - on digoxin, Toprol and also benazepril. Continue statin  2 diabetes mellitus-carb-controlled diet. Sliding scale insulin. Low dose lantus. Also on metformin at home which is on hold -Continue to monitor and adjust doses  3 hypokalemia-replace potassium especially while on Lasix  4 hypertension- but low normal BP here - continue Lasix, hold benazepril and metoprolol for now  5 CAD status post CABG-stable at this time. No chest pain. Troponins plateaued. Continue cardiac medications.  6 ascites-secondary to CHF exacerbation. Abdominal US showing fatty liver too -7.2L paracentesis - patient still feels it is distended.   7 DVT prophylaxis-on Lovenox.   All the records are reviewed and case discussed with Care Management/Social Workerr. Management plans discussed with the patient, family and they are in agreement.  CODE STATUS: full TOTAL TIME TAKING CARE OF THIS PATIENT: 28 minutes.   POSSIBLE D/C IN 1-2DAYS, DEPENDING ON CLINICAL CONDITION.   Ronald Miller,  Karenann Cai.D on 01/16/2016 at 12:44 PM  Between 7am to 6pm - Pager - [phone]  After 6pm: House Pager: - [phone]  [facility]  Office  [phone]  CC: Primary care physician; [facility]

## 2016-01-16 NOTE — Care Management (Signed)
Patient now lives with his sister on [address] in [place].  His cell number is unchanged and he says that it is in working order.  There is a house phone.  Patient is still followed at [facility] and says he is current with appointments.   He is agreeable to home health nursing and agency preference is [facility].  Heads up referral called to [facility]

## 2016-01-17 ENCOUNTER — Inpatient Hospital Stay: Payer: Medicare HMO

## 2016-01-17 LAB — GLUCOSE, CAPILLARY
GLUCOSE-CAPILLARY: 184 mg/dL — AB (ref 65–99)
Glucose-Capillary: 243 mg/dL — ABNORMAL HIGH (ref 65–99)
Glucose-Capillary: 147 mg/dL — ABNORMAL HIGH (ref 65–99)
Glucose-Capillary: 184 mg/dL — ABNORMAL HIGH (ref 65–99)

## 2016-01-17 NOTE — Progress Notes (Signed)
Pt. Alert and oriented. Pt. Able to verbalize needs,. Pt. slept well during the night. C/O pain x1, medication given with effective result, c/o insomnia x1 medication given with effective results.

## 2016-01-17 NOTE — Progress Notes (Signed)
A & O. Room air. V paced. No pain. Ambulated around the nurses station. Pt has no further concerns at this time.

## 2016-01-17 NOTE — Progress Notes (Signed)
[place] at [place] NAME: Ronald Miller    MRN#:  [id number]  DATE OF BIRTH:  [birth year]  SUBJECTIVE:  Hospital Day: 5 days Ronald Miller is a 60 y.o. male presenting with Shortness of Breath .   Overnight events: No overnight events Interval Events: Still complains of abdominal pain and distention states feels of breathing is also getting worse  REVIEW OF SYSTEMS:  CONSTITUTIONAL: No fever, fatigue or weakness.  EYES: No blurred or double vision.  EARS, NOSE, AND THROAT: No tinnitus or ear pain.  RESPIRATORY: No cough, shortness of breath, wheezing or hemoptysis.  CARDIOVASCULAR: No chest pain, orthopnea, edema.  GASTROINTESTINAL: No nausea, vomiting, diarrhea or abdominal pain.  GENITOURINARY: No dysuria, hematuria.  ENDOCRINE: No polyuria, nocturia,  HEMATOLOGY: No anemia, easy bruising or bleeding SKIN: No rash or lesion. MUSCULOSKELETAL: No joint pain or arthritis.   NEUROLOGIC: No tingling, numbness, weakness.  PSYCHIATRY: No anxiety or depression.   DRUG ALLERGIES:   Allergies  Allergen Reactions  . Codeine Nausea And Vomiting    VITALS:  Blood pressure 99/61, pulse 96, temperature 98 F (36.7 C), temperature source Oral, resp. rate 22, height 5\' 9"  (1.753 m), weight 104.463 kg (230 lb 4.8 oz), SpO2 93 %.  PHYSICAL EXAMINATION:  VITAL SIGNS: Filed Vitals:   01/17/16 0731 01/17/16 1114  BP: 97/62 99/61  Pulse: 88 96  Temp:  98 F (36.7 C)  Resp:  22   GENERAL:60 y.o.male currently in no acute distress.  HEAD: Normocephalic, atraumatic.  EYES: Pupils equal, round, reactive to light. Extraocular muscles intact. No scleral icterus.  MOUTH: Moist mucosal membrane. Dentition intact. No abscess noted.  EAR, NOSE, THROAT: Clear without exudates. No external lesions.  NECK: Supple. No thyromegaly. No nodules. No JVD.  PULMONARY: Scant rhonchi otherwise Clear to ascultation, without wheeze rails No use of accessory  muscles, Good respiratory effort. good air entry bilaterally CHEST: Nontender to palpation.  CARDIOVASCULAR: S1 and S2. Regular rate and rhythm. No murmurs, rubs, or gallops. No edema. Pedal pulses 2+ bilaterally.  GASTROINTESTINAL: Tense, nontender, distended. No masses. Positive bowel sounds. No hepatosplenomegaly.  MUSCULOSKELETAL: No swelling, clubbing, or edema. Range of motion full in all extremities.  NEUROLOGIC: Cranial nerves II through XII are intact. No gross focal neurological deficits. Sensation intact. Reflexes intact.  SKIN: No ulceration, lesions, rashes, or cyanosis. Skin warm and dry. Turgor intact.  PSYCHIATRIC: Mood, affect within normal limits. The patient is awake, alert and oriented x 3. Insight, judgment intact.      LABORATORY PANEL:   CBC  Recent Labs Lab 01/13/16 0652  WBC 6.8  HGB 13.0  HCT 39.8*  PLT 183   ------------------------------------------------------------------------------------------------------------------  Chemistries   Recent Labs Lab 01/12/16 1922  01/16/16 0800  NA 136  < > 137  K 3.1*  < > 3.7  CL 92*  < > 95*  CO2 37*  < > 41*  GLUCOSE 254*  < > 122*  BUN 41*  < > 22*  CREATININE 1.15  < > 0.90  CALCIUM 8.7*  < > 8.2*  AST 20  --   --   ALT 12*  --   --   ALKPHOS 84  --   --   BILITOT 0.8  --   --   < > = values in this interval not displayed. ------------------------------------------------------------------------------------------------------------------  Cardiac Enzymes  Recent Labs Lab 01/13/16 1247  TROPONINI 0.03   ------------------------------------------------------------------------------------------------------------------  RADIOLOGY:  No results found.  EKG:   Orders placed or performed during the hospital encounter of 01/12/16  . EKG 12-Lead  . EKG 12-Lead  . ED EKG  . ED EKG    ASSESSMENT AND PLAN:   Ronald Miller is a 61 y.o. male presenting with Shortness of Breath . Admitted  01/12/2016 : Day #: 5 days   1 Acute on chronic systolic heart failure exacerbation- continue IV lasix bidAs blood pressure tolerates-- ECHO with EF 20-25%, diffuse hypokinesis- ischemic cardiomyopathy - on digoxin, Toprol and also benazepril. Continue statin Case discussed with cardiology patient has a history with medical compliance 2 diabetes mellitus-carb-controlled diet. Sliding scale insulin. Low dose lantus. Also on metformin at home which is on hold -Continue to monitor and adjust doses  3 hypokalemia-replace potassium especially while on Lasix  4 hypertension- but low normal BP here - continue Lasix, hold benazepril and metoprolol for now  5 CAD status post CABG-stable at this time. No chest pain. Troponins plateaued. Continue cardiac medications.  6 ascites-secondary to CHF exacerbation. Abdominal US showing fatty liver too -7.2L paracentesis - patient still feels it is distended. -Recheck ultrasound may require additional paracenteses  7 DVT prophylaxis-on Lovenox.   All the records are reviewed and case discussed with Care Management/Social Workerr. Management plans discussed with the patient, family and they are in agreement.  CODE STATUS: full TOTAL TIME TAKING CARE OF THIS PATIENT: 28 minutes.   POSSIBLE D/C IN 1-2DAYS, DEPENDING ON CLINICAL CONDITION.   Hower,  Karenann Cai.D on 01/17/2016 at 1:40 PM  Between 7am to 6pm - Pager - [phone]  After 6pm: House Pager: - [phone]  [facility]  Office  [phone]  CC: Primary care physician; [facility]

## 2016-01-18 LAB — GLUCOSE, CAPILLARY: Glucose-Capillary: 129 mg/dL — ABNORMAL HIGH (ref 65–99)

## 2016-01-18 MED ORDER — OXYCODONE HCL 5 MG PO TABS: 5.0000 mg | ORAL_TABLET | ORAL | Status: DC | PRN

## 2016-01-18 MED ORDER — ZOLPIDEM TARTRATE 5 MG PO TABS: 5.0000 mg | ORAL_TABLET | Freq: Every evening | ORAL | Status: DC | PRN

## 2016-01-18 MED ORDER — DIGOXIN 250 MCG PO TABS: 0.2500 mg | ORAL_TABLET | Freq: Every day | ORAL | Status: DC

## 2016-01-18 MED ORDER — ATORVASTATIN CALCIUM 20 MG PO TABS: 20.0000 mg | ORAL_TABLET | Freq: Every evening | ORAL | Status: DC

## 2016-01-18 MED ORDER — FUROSEMIDE 80 MG PO TABS: 80.0000 mg | ORAL_TABLET | Freq: Every day | ORAL | Status: DC

## 2016-01-18 MED ORDER — OXYCODONE HCL 5 MG PO TABS: ORAL_TABLET | ORAL | Status: DC

## 2016-01-18 MED ORDER — FUROSEMIDE 40 MG PO TABS: 40.0000 mg | ORAL_TABLET | Freq: Every day | ORAL | Status: DC

## 2016-01-18 NOTE — Care Management Note (Signed)
Case Management Note  Patient Details  Name: Ronald Miller. MRN: [id number] Date of Birth: 23-Jul-1956  Subjective/Objective:      A referral for home health RN was called to [name] at [facility] requesting [name] RN services.               Action/Plan:   Expected Discharge Date:                  Expected Discharge Plan:     In-House Referral:     Discharge planning Services     Post Acute Care Choice:    Choice offered to:     DME Arranged:    DME Agency:     HH Arranged:    [place] Agency:     Status of Service:     Medicare Important Message Given:  Yes Date Medicare IM Given:    Medicare IM give by:    Date Additional Medicare IM Given:    Additional Medicare Important Message give by:     If discussed at [place] of Stay Meetings, dates discussed:    Additional Comments:  Lillyian Heidt A, RN 01/18/2016, 10:54 AM

## 2016-01-18 NOTE — Discharge Summary (Addendum)
[facility] [place] NAME: Ronald Miller    MR#:  [id number]  DATE OF BIRTH:  January 11, 1956  DATE OF ADMISSION:  01/12/2016 ADMITTING PHYSICIAN: Lance Coon, MD  DATE OF DISCHARGE: 01/18/2016  PRIMARY CARE PHYSICIAN: [facility]    ADMISSION DIAGNOSIS:  SOB  DISCHARGE DIAGNOSIS:  Principal Problem:   Acute on chronic systolic heart failure (HCC) Active Problems:   Type 2 diabetes mellitus (HCC)   COPD (chronic obstructive pulmonary disease) (HCC)   HTN (hypertension)   Arteriosclerosis of coronary artery   HLD (hyperlipidemia)   Acute on chronic systolic CHF (congestive heart failure) ([place])   SECONDARY DIAGNOSIS:   Past Medical History  Diagnosis Date  . ASCVD (arteriosclerotic cardiovascular disease)   . Diabetes mellitus   . Hypertension   . History of cocaine abuse   . Tobacco abuse   . Depression   . Suicidal ideation   . Chronic systolic CHF (congestive heart failure) ([place])   . COPD (chronic obstructive pulmonary disease) ([place])   . Coronary artery disease     HOSPITAL COURSE:  Ronald Miller  is a 60 y.o. male admitted 01/12/2016 with chief complaint Shortness of Breath . Please see H&P performed by Lance Coon, MD for further information. He was evaluated by cardiology during the admission to aid with his diuresis. He actually underwent paracentesis with 7L ascitic fluid removal. Remained on IV diuresis which helped control symptoms. Had some complaints of abdominal distention - which repeat ultrasound revealed small amount of ascitic fluid.   DISCHARGE CONDITIONS:   stable  CONSULTS OBTAINED:  Treatment Team:  Gladstone Lighter, MD Isaias Cowman, MD Lytle Butte, MD Corey Skains, MD  DRUG ALLERGIES:   Allergies  Allergen Reactions  . Codeine Nausea And Vomiting    DISCHARGE MEDICATIONS:   Current Discharge Medication List    START taking these medications   Details    oxyCODONE (OXY IR/ROXICODONE) 5 MG immediate release tablet 1-2 tabs every 6 hours for moderate pain Qty: 30 tablet, Refills: 0    zolpidem (AMBIEN) 5 MG tablet Take 1 tablet (5 mg total) by mouth at bedtime as needed for sleep. Qty: 30 tablet, Refills: 0      CONTINUE these medications which have CHANGED   Details  atorvastatin (LIPITOR) 20 MG tablet Take 1 tablet (20 mg total) by mouth every evening. Qty: 30 tablet, Refills: 0    !! digoxin (LANOXIN) 0.25 MG tablet Take 1 tablet (0.25 mg total) by mouth daily. Qty: 30 tablet, Refills: 2    !! digoxin (LANOXIN) 0.25 MG tablet Take 1 tablet (0.25 mg total) by mouth daily. Qty: 30 tablet, Refills: 0    !! furosemide (LASIX) 40 MG tablet Take 1 tablet (40 mg total) by mouth at bedtime. Qty: 30 tablet, Refills: 0    !! furosemide (LASIX) 80 MG tablet Take 1 tablet (80 mg total) by mouth daily. Qty: 30 tablet, Refills: 0     !! - Potential duplicate medications found. Please discuss with provider.    CONTINUE these medications which have NOT CHANGED   Details  acetaminophen (TYLENOL) 500 MG tablet Take 1,000 mg by mouth every 6 (six) hours as needed for mild pain or headache.    albuterol (PROVENTIL HFA;VENTOLIN HFA) 108 (90 BASE) MCG/ACT inhaler Inhale 2 puffs into the lungs every 6 (six) hours as needed for wheezing or shortness of breath.    aspirin EC 81 MG tablet Take  81 mg by mouth daily.    baclofen (LIORESAL) 10 MG tablet Take 10 mg by mouth 3 (three) times daily as needed for muscle spasms.     escitalopram (LEXAPRO) 20 MG tablet Take 1 tablet (20 mg total) by mouth daily. Qty: 30 tablet, Refills: 0    insulin glargine (LANTUS) 100 UNIT/ML injection Inject 30 Units into the skin at bedtime.    metolazone (ZAROXOLYN) 5 MG tablet Take 5 mg by mouth daily as needed. pt. takes 1 tablet daily only if needed for 4-5 lbs weight gain, Pt. Is to take with morning lasix and 1 potassium tablet.    metoprolol succinate  (TOPROL-XL) 100 MG 24 hr tablet Take 100 mg by mouth daily.     potassium chloride SA (K-DUR,KLOR-CON) 20 MEQ tablet Take 1 tablet (20 mEq total) by mouth 2 (two) times daily.         DISCHARGE INSTRUCTIONS:    DIET:  Cardiac diet  DISCHARGE CONDITION:  Stable  ACTIVITY:  Activity as tolerated  OXYGEN:  Home Oxygen: No.   Oxygen Delivery: room air  DISCHARGE LOCATION:  home   If you experience worsening of your admission symptoms, develop shortness of breath, life threatening emergency, suicidal or homicidal thoughts you must seek medical attention immediately by calling 911 or calling your MD immediately  if symptoms less severe.  You Must read complete instructions/literature along with all the possible adverse reactions/side effects for all the Medicines you take and that have been prescribed to you. Take any new Medicines after you have completely understood and accpet all the possible adverse reactions/side effects.   Please note  You were cared for by a hospitalist during your hospital stay. If you have any questions about your discharge medications or the care you received while you were in the hospital after you are discharged, you can call the unit and asked to speak with the hospitalist on call if the hospitalist that took care of you is not available. Once you are discharged, your primary care physician will handle any further medical issues. Please note that NO REFILLS for any discharge medications will be authorized once you are discharged, as it is imperative that you return to your primary care physician (or establish a relationship with a primary care physician if you do not have one) for your aftercare needs so that they can reassess your need for medications and monitor your lab values.    On the day of Discharge:   VITAL SIGNS:  Blood pressure 106/63, pulse 91, temperature 98.6 F (37 C), temperature source Oral, resp. rate 18, height 5\' 9"  (1.753 m),  weight 103.874 kg (229 lb), SpO2 93 %.  I/O:   Intake/Output Summary (Last 24 hours) at 01/18/16 1026 Last data filed at 01/18/16 1002  Gross per 24 hour  Intake   1080 ml  Output   1700 ml  Net   -620 ml    PHYSICAL EXAMINATION:  GENERAL:  60 y.o.-year-old patient lying in the bed with no acute distress.  EYES: Pupils equal, round, reactive to light and accommodation. No scleral icterus. Extraocular muscles intact.  HEENT: Head atraumatic, normocephalic. Oropharynx and nasopharynx clear.  NECK:  Supple, no jugular venous distention. No thyroid enlargement, no tenderness.  LUNGS: Normal breath sounds bilaterally, no wheezing, rales,rhonchi or crepitation. No use of accessory muscles of respiration.  CARDIOVASCULAR: S1, S2 normal. No murmurs, rubs, or gallops.  ABDOMEN: Soft, non-tender, non-distended. Bowel sounds present. No organomegaly or mass.  EXTREMITIES:  No pedal edema, cyanosis, or clubbing.  NEUROLOGIC: Cranial nerves II through XII are intact. Muscle strength 5/5 in all extremities. Sensation intact. Gait not checked.  PSYCHIATRIC: The patient is alert and oriented x 3.  SKIN: No obvious rash, lesion, or ulcer.   DATA REVIEW:   CBC  Recent Labs Lab 01/13/16 0652  WBC 6.8  HGB 13.0  HCT 39.8*  PLT 183    Chemistries   Recent Labs Lab 01/12/16 1922  01/16/16 0800  NA 136  < > 137  K 3.1*  < > 3.7  CL 92*  < > 95*  CO2 37*  < > 41*  GLUCOSE 254*  < > 122*  BUN 41*  < > 22*  CREATININE 1.15  < > 0.90  CALCIUM 8.7*  < > 8.2*  AST 20  --   --   ALT 12*  --   --   ALKPHOS 84  --   --   BILITOT 0.8  --   --   < > = values in this interval not displayed.  Cardiac Enzymes  Recent Labs Lab 01/13/16 1247  TROPONINI 0.03    Microbiology Results  Results for orders placed or performed during the hospital encounter of 01/12/16  MRSA PCR Screening     Status: None   Collection Time: 01/13/16  4:59 AM  Result Value Ref Range Status   MRSA by PCR  NEGATIVE NEGATIVE Final    Comment:        The GeneXpert MRSA Assay (FDA approved for NASAL specimens only), is one component of a comprehensive MRSA colonization surveillance program. It is not intended to diagnose MRSA infection nor to guide or monitor treatment for MRSA infections.     RADIOLOGY:  US Abdomen Limited  01/17/2016  CLINICAL DATA:  60 year old male with suspected ascites. EXAM: LIMITED ABDOMEN ULTRASOUND FOR ASCITES TECHNIQUE: Limited ultrasound survey for ascites was performed in all four abdominal quadrants. COMPARISON:  01/13/2016. FINDINGS: Small to moderate volume of ascites noted throughout the peritoneal cavity. IMPRESSION: 1. Small to moderate volume of ascites. Electronically Signed   By: Vinnie Langton M.D.   On: 01/17/2016 15:37     Management plans discussed with the patient, family and they are in agreement.  CODE STATUS:     Code Status Orders        Start     Ordered   01/13/16 0050  Full code   Continuous     01/13/16 0049    Code Status History    Date Active Date Inactive Code Status Order ID Comments User Context   10/27/2015  7:17 PM 11/03/2015  7:25 PM Full Code [id number]  Epifanio Lesches, MD ED   10/14/2015  1:39 AM 10/21/2015  8:57 PM Full Code [id number]  Saundra Shelling, MD Inpatient   08/28/2015  2:40 AM 09/01/2015  8:01 PM Full Code [id number]  Lance Coon, MD Inpatient    Advance Directive Documentation        Most Recent Value   Type of Advance Directive  Living will   Pre-existing out of facility DNR order (yellow form or pink MOST form)     "MOST" Form in Place?        TOTAL TIME TAKING CARE OF THIS PATIENT: 28 minutes.    Hower,  Karenann Cai.D on 01/18/2016 at 10:26 AM  Between 7am to 6pm - Pager - [phone]  After 6pm go to [email or web address] - Patent attorney  Hospitalists  Office  [phone]  CC: Primary care physician; [facility]

## 2016-01-18 NOTE — Progress Notes (Signed)
A & O. Pt complained of pain and received meds for it. Room air. NSR. IV and tele removed. Prescriptions given to pt. Discharge instructions given to pt. Pt has no further concerns at this time.

## 2016-01-18 NOTE — Progress Notes (Signed)
Pt. Alert and oriented. Pt. Able to verbalize needs,.C/O pain x1, medication given with effective result, c/o insomnia x1 medication given with effective results. Pt. Does have a non-productive cough. Pt. slept well during the night.

## 2016-01-26 ENCOUNTER — Inpatient Hospital Stay
Admission: EM | Admit: 2016-01-26 | Discharge: 2016-01-29 | DRG: 292 | Disposition: A | Discharge status: Home / Self Care | Payer: Medicare HMO | Attending: Internal Medicine | Admitting: Internal Medicine

## 2016-01-26 ENCOUNTER — Ambulatory Visit: Payer: Medicare HMO | Admitting: Family

## 2016-01-26 ENCOUNTER — Encounter: Payer: Self-pay | Admitting: Emergency Medicine

## 2016-01-26 ENCOUNTER — Emergency Department: Payer: Medicare HMO

## 2016-01-26 DIAGNOSIS — R001 Bradycardia, unspecified: Secondary | ICD-10-CM | POA: Diagnosis present

## 2016-01-26 DIAGNOSIS — I110 Hypertensive heart disease with heart failure: Principal | ICD-10-CM | POA: Diagnosis present

## 2016-01-26 DIAGNOSIS — F329 Major depressive disorder, single episode, unspecified: Secondary | ICD-10-CM | POA: Diagnosis present

## 2016-01-26 DIAGNOSIS — Z886 Allergy status to analgesic agent status: Secondary | ICD-10-CM | POA: Diagnosis not present

## 2016-01-26 DIAGNOSIS — Z79899 Other long term (current) drug therapy: Secondary | ICD-10-CM | POA: Diagnosis not present

## 2016-01-26 DIAGNOSIS — E119 Type 2 diabetes mellitus without complications: Secondary | ICD-10-CM | POA: Diagnosis present

## 2016-01-26 DIAGNOSIS — Z9049 Acquired absence of other specified parts of digestive tract: Secondary | ICD-10-CM

## 2016-01-26 DIAGNOSIS — N179 Acute kidney failure, unspecified: Secondary | ICD-10-CM | POA: Diagnosis present

## 2016-01-26 DIAGNOSIS — R188 Other ascites: Secondary | ICD-10-CM | POA: Diagnosis present

## 2016-01-26 DIAGNOSIS — Z981 Arthrodesis status: Secondary | ICD-10-CM | POA: Diagnosis not present

## 2016-01-26 DIAGNOSIS — R1900 Intra-abdominal and pelvic swelling, mass and lump, unspecified site: Secondary | ICD-10-CM | POA: Diagnosis present

## 2016-01-26 DIAGNOSIS — E871 Hypo-osmolality and hyponatremia: Secondary | ICD-10-CM | POA: Diagnosis present

## 2016-01-26 DIAGNOSIS — I2510 Atherosclerotic heart disease of native coronary artery without angina pectoris: Secondary | ICD-10-CM | POA: Diagnosis present

## 2016-01-26 DIAGNOSIS — Z951 Presence of aortocoronary bypass graft: Secondary | ICD-10-CM | POA: Diagnosis not present

## 2016-01-26 DIAGNOSIS — I5023 Acute on chronic systolic (congestive) heart failure: Secondary | ICD-10-CM | POA: Diagnosis present

## 2016-01-26 DIAGNOSIS — Z8249 Family history of ischemic heart disease and other diseases of the circulatory system: Secondary | ICD-10-CM

## 2016-01-26 DIAGNOSIS — Z87891 Personal history of nicotine dependence: Secondary | ICD-10-CM | POA: Diagnosis not present

## 2016-01-26 DIAGNOSIS — J449 Chronic obstructive pulmonary disease, unspecified: Secondary | ICD-10-CM | POA: Diagnosis present

## 2016-01-26 DIAGNOSIS — Z794 Long term (current) use of insulin: Secondary | ICD-10-CM

## 2016-01-26 DIAGNOSIS — E876 Hypokalemia: Secondary | ICD-10-CM | POA: Diagnosis present

## 2016-01-26 DIAGNOSIS — Z95810 Presence of automatic (implantable) cardiac defibrillator: Secondary | ICD-10-CM | POA: Diagnosis not present

## 2016-01-26 DIAGNOSIS — R0602 Shortness of breath: Secondary | ICD-10-CM

## 2016-01-26 DIAGNOSIS — Z833 Family history of diabetes mellitus: Secondary | ICD-10-CM | POA: Diagnosis not present

## 2016-01-26 DIAGNOSIS — Z888 Allergy status to other drugs, medicaments and biological substances status: Secondary | ICD-10-CM

## 2016-01-26 LAB — COMPREHENSIVE METABOLIC PANEL
ALT: 19 U/L (ref 17–63)
ANION GAP: 11 (ref 5–15)
AST: 27 U/L (ref 15–41)
Albumin: 3.6 g/dL (ref 3.5–5.0)
Alkaline Phosphatase: 118 U/L (ref 38–126)
BUN: 36 mg/dL — ABNORMAL HIGH (ref 6–20)
CHLORIDE: 96 mmol/L — AB (ref 101–111)
CO2: 28 mmol/L (ref 22–32)
Calcium: 8.6 mg/dL — ABNORMAL LOW (ref 8.9–10.3)
Creatinine, Ser: 1.34 mg/dL — ABNORMAL HIGH (ref 0.61–1.24)
GFR, EST NON AFRICAN AMERICAN: 56 mL/min — AB (ref >60)
Glucose, Bld: 152 mg/dL — ABNORMAL HIGH (ref 65–99)
POTASSIUM: 3.8 mmol/L (ref 3.5–5.1)
Sodium: 135 mmol/L (ref 135–145)
Total Bilirubin: 0.7 mg/dL (ref 0.3–1.2)
Total Protein: 7 g/dL (ref 6.5–8.1)

## 2016-01-26 LAB — TROPONIN I: TROPONIN I: 0.03 ng/mL (ref <0.031)

## 2016-01-26 LAB — CBC WITH DIFFERENTIAL/PLATELET
BASOS ABS: 0.1 10*3/uL (ref 0–0.1)
BASOS PCT: 1 %
Eosinophils Absolute: 0.1 10*3/uL (ref 0–0.7)
Eosinophils Relative: 1 %
HCT: 39.2 % — ABNORMAL LOW (ref 40.0–52.0)
HEMOGLOBIN: 13 g/dL (ref 13.0–18.0)
Lymphocytes Relative: 9 %
Lymphs Abs: 0.8 10*3/uL — ABNORMAL LOW (ref 1.0–3.6)
MCH: 29.3 pg (ref 26.0–34.0)
MCHC: 33.2 g/dL (ref 32.0–36.0)
MCV: 88.2 fL (ref 80.0–100.0)
Monocytes Absolute: 0.7 10*3/uL (ref 0.2–1.0)
Monocytes Relative: 8 %
NEUTROS ABS: 7.5 10*3/uL — AB (ref 1.4–6.5)
NEUTROS PCT: 81 %
Platelets: 180 10*3/uL (ref 150–440)
RBC: 4.45 MIL/uL (ref 4.40–5.90)
RDW: 16.6 % — AB (ref 11.5–14.5)
WBC: 9.2 10*3/uL (ref 3.8–10.6)

## 2016-01-26 LAB — CREATININE, SERUM
CREATININE: 1.4 mg/dL — AB (ref 0.61–1.24)
GFR, EST NON AFRICAN AMERICAN: 54 mL/min — AB (ref >60)

## 2016-01-26 LAB — GLUCOSE, CAPILLARY
Glucose-Capillary: 146 mg/dL — ABNORMAL HIGH (ref 65–99)
Glucose-Capillary: 210 mg/dL — ABNORMAL HIGH (ref 65–99)
Glucose-Capillary: 167 mg/dL — ABNORMAL HIGH (ref 65–99)

## 2016-01-26 LAB — URINE DRUG SCREEN, QUALITATIVE (ARMC ONLY)
AMPHETAMINES, UR SCREEN: NOT DETECTED
BENZODIAZEPINE, UR SCRN: NOT DETECTED
Barbiturates, Ur Screen: NOT DETECTED
CANNABINOID 50 NG, UR ~~LOC~~: NOT DETECTED
Cocaine Metabolite,Ur ~~LOC~~: NOT DETECTED
MDMA (ECSTASY) UR SCREEN: NOT DETECTED
Methadone Scn, Ur: NOT DETECTED
Opiate, Ur Screen: NOT DETECTED
Phencyclidine (PCP) Ur S: NOT DETECTED
TRICYCLIC, UR SCREEN: NOT DETECTED

## 2016-01-26 LAB — CBC
HCT: 40.1 % (ref 40.0–52.0)
Hemoglobin: 13.1 g/dL (ref 13.0–18.0)
MCH: 28.9 pg (ref 26.0–34.0)
MCHC: 32.8 g/dL (ref 32.0–36.0)
MCV: 88.1 fL (ref 80.0–100.0)
PLATELETS: 202 10*3/uL (ref 150–440)
RBC: 4.55 MIL/uL (ref 4.40–5.90)
RDW: 17.1 % — AB (ref 11.5–14.5)
WBC: 8.3 10*3/uL (ref 3.8–10.6)

## 2016-01-26 LAB — LIPASE, BLOOD: LIPASE: 21 U/L (ref 11–51)

## 2016-01-26 LAB — MAGNESIUM: MAGNESIUM: 1.5 mg/dL — AB (ref 1.7–2.4)

## 2016-01-26 LAB — BRAIN NATRIURETIC PEPTIDE: B NATRIURETIC PEPTIDE 5: 1578 pg/mL — AB (ref 0.0–100.0)

## 2016-01-26 LAB — ETHANOL

## 2016-01-26 LAB — DIGOXIN LEVEL

## 2016-01-26 MED ORDER — ATORVASTATIN CALCIUM 20 MG PO TABS
20.0000 mg | ORAL_TABLET | Freq: Every evening | ORAL | Status: DC
  Administered 2016-01-26 – 2016-01-28 (×3): 20 mg via ORAL
  Filled 2016-01-26 (×3): qty 1

## 2016-01-26 MED ORDER — ACETAMINOPHEN 325 MG PO TABS: 650.0000 mg | ORAL_TABLET | Freq: Four times a day (QID) | ORAL | Status: DC | PRN

## 2016-01-26 MED ORDER — BACLOFEN 10 MG PO TABS: 10.0000 mg | ORAL_TABLET | Freq: Three times a day (TID) | ORAL | Status: DC | PRN

## 2016-01-26 MED ORDER — BISACODYL 5 MG PO TBEC
5.0000 mg | DELAYED_RELEASE_TABLET | Freq: Every day | ORAL | Status: DC | PRN
  Administered 2016-01-28: 5 mg via ORAL
  Filled 2016-01-26 (×2): qty 1

## 2016-01-26 MED ORDER — POTASSIUM CHLORIDE CRYS ER 20 MEQ PO TBCR
20.0000 meq | EXTENDED_RELEASE_TABLET | Freq: Two times a day (BID) | ORAL | Status: DC
  Administered 2016-01-26 – 2016-01-29 (×7): 20 meq via ORAL
  Filled 2016-01-26 (×7): qty 1

## 2016-01-26 MED ORDER — HEPARIN SODIUM (PORCINE) 5000 UNIT/ML IJ SOLN
5000.0000 [IU] | Freq: Three times a day (TID) | INTRAMUSCULAR | Status: DC
  Administered 2016-01-26 – 2016-01-27 (×3): 5000 [IU] via SUBCUTANEOUS
  Filled 2016-01-26 (×4): qty 1

## 2016-01-26 MED ORDER — ALBUTEROL SULFATE HFA 108 (90 BASE) MCG/ACT IN AERS: 2.0000 | INHALATION_SPRAY | Freq: Four times a day (QID) | RESPIRATORY_TRACT | Status: DC | PRN

## 2016-01-26 MED ORDER — DIGOXIN 250 MCG PO TABS
0.2500 mg | ORAL_TABLET | Freq: Every day | ORAL | Status: DC
  Administered 2016-01-26 – 2016-01-29 (×4): 0.25 mg via ORAL
  Filled 2016-01-26: qty 1
  Filled 2016-01-26: qty 2
  Filled 2016-01-26 (×2): qty 1

## 2016-01-26 MED ORDER — INSULIN GLARGINE 100 UNIT/ML ~~LOC~~ SOLN
30.0000 [IU] | Freq: Every day | SUBCUTANEOUS | Status: DC
  Administered 2016-01-26 – 2016-01-28 (×3): 30 [IU] via SUBCUTANEOUS
  Filled 2016-01-26 (×4): qty 0.3

## 2016-01-26 MED ORDER — INSULIN ASPART 100 UNIT/ML ~~LOC~~ SOLN
0.0000 [IU] | Freq: Three times a day (TID) | SUBCUTANEOUS | Status: DC
  Administered 2016-01-26: 2 [IU] via SUBCUTANEOUS
  Administered 2016-01-26 – 2016-01-27 (×3): 5 [IU] via SUBCUTANEOUS
  Administered 2016-01-28 – 2016-01-29 (×4): 3 [IU] via SUBCUTANEOUS
  Filled 2016-01-26: qty 5
  Filled 2016-01-26: qty 6
  Filled 2016-01-26 (×3): qty 3
  Filled 2016-01-26: qty 5
  Filled 2016-01-26: qty 2
  Filled 2016-01-26: qty 5

## 2016-01-26 MED ORDER — ONDANSETRON HCL 4 MG PO TABS: 4.0000 mg | ORAL_TABLET | Freq: Four times a day (QID) | ORAL | Status: DC | PRN

## 2016-01-26 MED ORDER — ESCITALOPRAM OXALATE 10 MG PO TABS
20.0000 mg | ORAL_TABLET | Freq: Every day | ORAL | Status: DC
  Administered 2016-01-26 – 2016-01-29 (×4): 20 mg via ORAL
  Filled 2016-01-26 (×4): qty 2

## 2016-01-26 MED ORDER — ONDANSETRON HCL 4 MG/2ML IJ SOLN
4.0000 mg | Freq: Four times a day (QID) | INTRAMUSCULAR | Status: DC | PRN
  Administered 2016-01-26: 4 mg via INTRAVENOUS
  Filled 2016-01-26: qty 2

## 2016-01-26 MED ORDER — ACETAMINOPHEN 650 MG RE SUPP: 650.0000 mg | Freq: Four times a day (QID) | RECTAL | Status: DC | PRN

## 2016-01-26 MED ORDER — DOCUSATE SODIUM 100 MG PO CAPS
100.0000 mg | ORAL_CAPSULE | Freq: Two times a day (BID) | ORAL | Status: DC
  Administered 2016-01-26 – 2016-01-29 (×7): 100 mg via ORAL
  Filled 2016-01-26 (×7): qty 1

## 2016-01-26 MED ORDER — OXYCODONE HCL 5 MG PO TABS
5.0000 mg | ORAL_TABLET | ORAL | Status: DC | PRN
  Administered 2016-01-26: 5 mg via ORAL
  Filled 2016-01-26: qty 1

## 2016-01-26 MED ORDER — ALBUTEROL SULFATE (2.5 MG/3ML) 0.083% IN NEBU: 2.5000 mg | INHALATION_SOLUTION | Freq: Four times a day (QID) | RESPIRATORY_TRACT | Status: DC | PRN

## 2016-01-26 MED ORDER — FUROSEMIDE 10 MG/ML IJ SOLN
60.0000 mg | Freq: Once | INTRAMUSCULAR | Status: AC
  Administered 2016-01-26: 60 mg via INTRAVENOUS
  Filled 2016-01-26: qty 8

## 2016-01-26 MED ORDER — ONDANSETRON HCL 4 MG/2ML IJ SOLN
4.0000 mg | Freq: Once | INTRAMUSCULAR | Status: AC
  Administered 2016-01-26: 4 mg via INTRAVENOUS
  Filled 2016-01-26: qty 2

## 2016-01-26 MED ORDER — ASPIRIN EC 81 MG PO TBEC
81.0000 mg | DELAYED_RELEASE_TABLET | Freq: Every day | ORAL | Status: DC
  Administered 2016-01-26 – 2016-01-29 (×4): 81 mg via ORAL
  Filled 2016-01-26 (×4): qty 1

## 2016-01-26 MED ORDER — FUROSEMIDE 10 MG/ML IJ SOLN
80.0000 mg | Freq: Two times a day (BID) | INTRAMUSCULAR | Status: DC
  Administered 2016-01-26 – 2016-01-28 (×3): 80 mg via INTRAVENOUS
  Filled 2016-01-26 (×3): qty 8

## 2016-01-26 MED ORDER — METOLAZONE 5 MG PO TABS
5.0000 mg | ORAL_TABLET | Freq: Every day | ORAL | Status: DC | PRN
  Filled 2016-01-26: qty 1

## 2016-01-26 NOTE — ED Notes (Signed)
Patient transported to X-ray 

## 2016-01-26 NOTE — ED Notes (Signed)
Pt from home with increasing abdominal fluid and shortness of breath. Pt has hx of ascites and had fluid drained from abdomen 2 weeks ago.

## 2016-01-26 NOTE — ED Notes (Signed)
Pt states he is feeling better, nausea has gone away.Marland Kitchen

## 2016-01-26 NOTE — Care Management Note (Addendum)
Case Management Note  Patient Details  Name: Ronald Miller. MRN: [id number] Date of Birth: Aug 13, 1956  Subjective/Objective:     Call to Kyla Balzarine, [facility] Liaison, who reports that [name] refused to allow anyone from [place] provide home health services. [name] was discharged home with a referral to [facility] on 01/18/16, and is readmitted 01/26/16. Admitted with dyspnea and lower extremity edema. Hx: CHF, Diabetes II, COPD, HTN. Lives alone. PCP and Medication from the [facility]. Case management will follow for discharge planning.               Action/Plan:   Expected Discharge Date:                  Expected Discharge Plan:     In-House Referral:     Discharge planning Services     Post Acute Care Choice:    Choice offered to:     DME Arranged:    DME Agency:     HH Arranged:    [place] Agency:     Status of Service:     Medicare Important Message Given:    Date Medicare IM Given:    Medicare IM give by:    Date Additional Medicare IM Given:    Additional Medicare Important Message give by:     If discussed at [name] of Stay Meetings, dates discussed:    Additional Comments:  Saniyya Gau A, RN 01/26/2016, 12:37 PM

## 2016-01-26 NOTE — H&P (Signed)
[facility] [facility] [place] NAME: Boy Fabry    MR#:  [id number]  DATE OF BIRTH:  18-Oct-1955  DATE OF ADMISSION:  01/26/2016  PRIMARY CARE PHYSICIAN: [facility]   REQUESTING/REFERRING PHYSICIAN: [place]  CHIEF COMPLAINT:   Chief Complaint  Patient presents with  . Shortness of Breath    HISTORY OF PRESENT ILLNESS:  Taylour Veronica  is a 60 y.o. male with a known history of hypertension, diabetes mellitus type 2, chronic systolic heart failure with EF of 20% with AICD placement before comes in with shortness of breath, orthopnea, PND for 1 week and also 20 pound weight gain in the past 1 week. Patient also complains of pain in the upper back. Has some cough and clear phlegm. Has recurrent admissions for same problem. Had paracentesis done because of ascites. Now he complains of abdominal swelling and tightness in the abdomen for the past 1 week. No extremity edema. No chest pain. No fever.  PAST MEDICAL HISTORY:   Past Medical History  Diagnosis Date  . ASCVD (arteriosclerotic cardiovascular disease)   . Diabetes mellitus   . Hypertension   . History of cocaine abuse   . Tobacco abuse   . Depression   . Suicidal ideation   . Chronic systolic CHF (congestive heart failure) ([place])   . COPD (chronic obstructive pulmonary disease) ([place])   . Coronary artery disease     PAST SURGICAL HISTOIRY:   Past Surgical History  Procedure Laterality Date  . Coronary artery bypass graft  2001  . Lumbar disc surgery  02/1999    Discectomy and fusion  . Vasectomy      Subsequent reversal  . Insert / replace / remove pacemaker    . Cardiac defibrillator placement    . Cholecystectomy N/A 08/30/2015    Procedure: LAPAROSCOPIC CHOLECYSTECTOMY;  Surgeon: Hubbard Robinson, MD;  Location: [name];  Service: General;  Laterality: N/A;  . Cardiac defibrillator placement      SOCIAL HISTORY:   Social History  Substance Use  Topics  . Smoking status: Former Smoker -- 1.50 packs/day for 23 years    Types: Cigarettes    Quit date: 08/27/2015  . Smokeless tobacco: Never Used  . Alcohol Use: No    FAMILY HISTORY:   Family History  Problem Relation Age of Onset  . CAD Father   . Diabetes Father   . Heart failure Father     DRUG ALLERGIES:   Allergies  Allergen Reactions  . Codeine Nausea And Vomiting    REVIEW OF SYSTEMS:  CONSTITUTIONAL: No fever, fatigue or weakness.  EYES: No blurred or double vision.  EARS, NOSE, AND THROAT: No tinnitus or ear pain.  RESPIRATORY: Cough with clear phlegm for 1 week, has shortness of breath, orthopnea, PND.Marland Kitchen  CARDIOVASCULAR: No chest pain, as orthopnea, PND. GASTROINTESTINAL: Has some nausea no vomiting, diarrhea or abdominal pain. Has abdominal swelling with ascites.  GENITOURINARY: No dysuria, hematuria.  ENDOCRINE: No polyuria, nocturia,  HEMATOLOGY: No anemia, easy bruising or bleeding SKIN: No rash or lesion. MUSCULOSKELETAL: No joint pain or arthritis.   NEUROLOGIC: No tingling, numbness, weakness.  PSYCHIATRY: No anxiety or depression.   MEDICATIONS AT HOME:   Prior to Admission medications   Medication Sig Start Date End Date Taking? Authorizing Provider  acetaminophen (TYLENOL) 500 MG tablet Take 1,000 mg by mouth every 6 (six) hours as needed for mild pain or headache.   Yes Historical Provider, MD  albuterol (PROVENTIL HFA;VENTOLIN HFA) 108 (90 BASE) MCG/ACT inhaler Inhale 2 puffs into the lungs every 6 (six) hours as needed for wheezing or shortness of breath.   Yes Historical Provider, MD  aspirin EC 81 MG tablet Take 81 mg by mouth daily.   Yes Historical Provider, MD  atorvastatin (LIPITOR) 20 MG tablet Take 1 tablet (20 mg total) by mouth every evening. 01/18/16  Yes Lytle Butte, MD  atorvastatin (LIPITOR) 20 MG tablet Take 1 tablet (20 mg total) by mouth every evening. 01/18/16  Yes Lytle Butte, MD  baclofen (LIORESAL) 10 MG tablet Take 10  mg by mouth 3 (three) times daily as needed for muscle spasms.    Yes Historical Provider, MD  digoxin (LANOXIN) 0.25 MG tablet Take 1 tablet (0.25 mg total) by mouth daily. 01/18/16  Yes Lytle Butte, MD  digoxin (LANOXIN) 0.25 MG tablet Take 1 tablet (0.25 mg total) by mouth daily. 01/18/16  Yes Lytle Butte, MD  escitalopram (LEXAPRO) 20 MG tablet Take 1 tablet (20 mg total) by mouth daily. 09/02/15  Yes Max Sane, MD  furosemide (LASIX) 40 MG tablet Take 1 tablet (40 mg total) by mouth at bedtime. 01/18/16  Yes Lytle Butte, MD  furosemide (LASIX) 80 MG tablet Take 1 tablet (80 mg total) by mouth daily. 01/18/16  Yes Lytle Butte, MD  insulin glargine (LANTUS) 100 UNIT/ML injection Inject 30 Units into the skin at bedtime.   Yes Historical Provider, MD  metolazone (ZAROXOLYN) 5 MG tablet Take 5 mg by mouth daily as needed. pt. takes 1 tablet daily only if needed for 4-5 lbs weight gain, Pt. Is to take with morning lasix and 1 potassium tablet.   Yes Historical Provider, MD  metoprolol succinate (TOPROL-XL) 100 MG 24 hr tablet Take 100 mg by mouth daily.    Yes Historical Provider, MD  oxyCODONE (OXY IR/ROXICODONE) 5 MG immediate release tablet 1-2 tabs every 6 hours for moderate pain 01/18/16  Yes Lytle Butte, MD  potassium chloride SA (K-DUR,KLOR-CON) 20 MEQ tablet Take 1 tablet (20 mEq total) by mouth 2 (two) times daily. Patient taking differently: Take 20 mEq by mouth daily as needed (Pt. takes one tablet with the PRN metolazone).  10/21/15  Yes Srikar Sudini, MD  zolpidem (AMBIEN) 5 MG tablet Take 1 tablet (5 mg total) by mouth at bedtime as needed for sleep. 01/18/16  Yes Lytle Butte, MD      VITAL SIGNS:  Blood pressure 101/82, pulse 77, temperature 97.2 F (36.2 C), resp. rate 21, height 5\' 9"  (1.753 m), weight 111.131 kg (245 lb), SpO2 86 %.  PHYSICAL EXAMINATION:  GENERAL:  60 y.o.-year-old patient lying in the bed with no acute distress.  EYES: Pupils equal, round, reactive to  light and accommodation. No scleral icterus. Extraocular muscles intact.  HEENT: Head atraumatic, normocephalic. Oropharynx and nasopharynx clear.  NECK:  Supple, no jugular venous distention. No thyroid enlargement, no tenderness.  LUNGS: Normal breath sounds bilaterally, no wheezing, rales,rhonchi or crepitation. No use of accessory muscles of respiration.  CARDIOVASCULAR: S1, S2 normal. No murmurs, rubs, or gallops.  ABDOMEN: Soft, nontender, nondistended. Bowel sounds present. No organomegaly or mass.  EXTREMITIES: No pedal edema, cyanosis, or clubbing.  NEUROLOGIC: Cranial nerves II through XII are intact. Muscle strength 5/5 in all extremities. Sensation intact. Gait not checked.  PSYCHIATRIC: The patient is alert and oriented x 3.  SKIN: No obvious rash, lesion, or ulcer.   LABORATORY PANEL:  CBC  Recent Labs Lab 01/26/16 0911  WBC 9.2  HGB 13.0  HCT 39.2*  PLT 180   ------------------------------------------------------------------------------------------------------------------  Chemistries   Recent Labs Lab 01/26/16 0911  NA 135  K 3.8  CL 96*  CO2 28  GLUCOSE 152*  BUN 36*  CREATININE 1.34*  CALCIUM 8.6*  AST 27  ALT 19  ALKPHOS 118  BILITOT 0.7   ------------------------------------------------------------------------------------------------------------------  Cardiac Enzymes  Recent Labs Lab 01/26/16 0911  TROPONINI 0.03   ------------------------------------------------------------------------------------------------------------------  RADIOLOGY:  Dg Chest 2 View  01/26/2016  CLINICAL DATA:  Shortness of breath. EXAM: CHEST  2 VIEW COMPARISON:  01/12/2016. FINDINGS: Prior CABG. AICD noted in stable position. Stable cardiomegaly. Low lung volumes with mild basilar atelectasis. No pleural effusion or pneumothorax. Degenerative changes thoracic spine . IMPRESSION: 1.  AICD in stable position.  Prior CABG.  Stable cardiomegaly. 2.  Low lung volumes  with mild basilar atelectasis. Electronically Signed   By: Marcello Moores  Register   On: 01/26/2016 09:45    EKG:   Orders placed or performed during the hospital encounter of 01/26/16  . EKG 12-Lead  . EKG 12-Lead  . ED EKG  . ED EKG    IMPRESSION AND PLAN:   Acute on chronic systolic heart failure ; elevated BNP up to 1571, evidence of orthopnea PND weight gain with ascites. Admitted to hospitalist service on telemetry, start on IV Lasix milligrams every 12 hours continue low-salt diet, check daily weights, evaluate for possible need for paracentesis if patient doesn't improve with IV Lasix. Not hypoxic oxygen saturation about 95% . 86% are documented in vitals but during my visit patient maintaining 95% on room air. #2,weight  gain of 20 pounds 20 pounds according to him he was 220 pounds but now he is 240 pounds in 1 week he gained 20 pounds. Recently discharged on 16th of April. 3 abdominal distention secondary to CHF exacerbation: Patient had a paracentesis done and several liters of fluid was removed during the recent admission. We'll evaluate with the ultrasound guided paracentesis #4. Diabetes mellitus type 2 and continue Lantus 30 units at bedtime. Also continue low carb diet,and insulin with sliding  scale coverage #5 chronic systolic heart failure EF 25% with AICD placement: Patient is on Toprol-XL but I decreased the dose secondary to bradycardia. Continue digoxin. 6 .depression: Continue Lexapro 20 mg daily. #7 acute renal failure:  Due to Probably due to CHF exacerbation: Continue IV Lasix. And monitor kidney function closely . All the records are reviewed and case discussed with ED provider. Management plans discussed with the patient, family and they are in agreement.  CODE STATUS: full  TOTAL TIME TAKING CARE OF THIS PATIENT: 56 minutes.    Epifanio Lesches M.D on 01/26/2016 at 11:18 AM  Between 7am to 6pm - Pager - [phone]  After 6pm go to [email or web address] - password  [facility]  [place] Hospitalists  Office  [phone]  CC: Primary care physician; [facility]  Note: This dictation was prepared with Dragon dictation along with smaller phrase technology. Any transcriptional errors that result from this process are unintentional.

## 2016-01-26 NOTE — ED Provider Notes (Signed)
[facility] Emergency Department Provider Note  ____________________________________________   I have reviewed the triage vital signs and the nursing notes.   HISTORY  Chief Complaint Shortness of Breath    HPI Ronald Matesic Davontre Ackland. is a 60 y.o. male who presents today complaining of shortness of breath again. Patient was seen here recently for the same thing. He did all that time have ascites. It is unclear the etiology. It was thought apparently that it was from CHF. Patient states that it is reaccumulated. He did have 7 L taken off during his recent admission. He does not have significant leg swelling but he has ongoing shortness of breath which is worse. Its exertional as well as orthopneic. Patient states that he denies any chest pain. He does not have any abdominal pain although he feels the tension of the fluid. He has gained weight since discharge. He is not sure exactly how much but possibly 20 pounds. He states he is being compliant with his medication regimen.He did have some vomiting this morning, denies any fever.     Past Medical History  Diagnosis Date  . ASCVD (arteriosclerotic cardiovascular disease)   . Diabetes mellitus   . Hypertension   . History of cocaine abuse   . Tobacco abuse   . Depression   . Suicidal ideation   . Chronic systolic CHF (congestive heart failure) ([place])   . COPD (chronic obstructive pulmonary disease) ([place])   . Coronary artery disease     Patient Active Problem List   Diagnosis Date Noted  . Acute on chronic systolic CHF (congestive heart failure) ([place]) 01/12/2016  . Pressure ulcer 10/15/2015  . Dyspnea 10/14/2015  . Fluid overload 10/14/2015  . Chronic cholecystitis with calculus   . COPD (chronic obstructive pulmonary disease) ([place]) 08/28/2015  . HTN (hypertension) 08/28/2015  . Depression 08/28/2015  . Elevated troponin 08/28/2015  . AICD discharge 08/28/2015  . Hypokalemia 08/28/2015  . Hypotension  08/28/2015  . Acute on chronic systolic heart failure ([place]) 02/04/2015  . Arteriosclerosis of coronary artery 03/05/2014  . Diabetes mellitus ([place]) 03/05/2014  . HLD (hyperlipidemia) 03/05/2014  . Type 2 diabetes mellitus ([place]) 08/06/2009  . TOBACCO ABUSE 08/06/2009  . Cardiovascular disease 08/06/2009  . LOW BACK PAIN, CHRONIC 08/06/2009    Past Surgical History  Procedure Laterality Date  . Coronary artery bypass graft  2001  . Lumbar disc surgery  02/1999    Discectomy and fusion  . Vasectomy      Subsequent reversal  . Insert / replace / remove pacemaker    . Cardiac defibrillator placement    . Cholecystectomy N/A 08/30/2015    Procedure: LAPAROSCOPIC CHOLECYSTECTOMY;  Surgeon: Hubbard Robinson, MD;  Location: [name];  Service: General;  Laterality: N/A;  . Cardiac defibrillator placement      Current Outpatient Rx  Name  Route  Sig  Dispense  Refill  . acetaminophen (TYLENOL) 500 MG tablet   Oral   Take 1,000 mg by mouth every 6 (six) hours as needed for mild pain or headache.         . albuterol (PROVENTIL HFA;VENTOLIN HFA) 108 (90 BASE) MCG/ACT inhaler   Inhalation   Inhale 2 puffs into the lungs every 6 (six) hours as needed for wheezing or shortness of breath.         Marland Kitchen aspirin EC 81 MG tablet   Oral   Take 81 mg by mouth daily.         Marland Kitchen  atorvastatin (LIPITOR) 20 MG tablet   Oral   Take 1 tablet (20 mg total) by mouth every evening.   30 tablet   0   . atorvastatin (LIPITOR) 20 MG tablet   Oral   Take 1 tablet (20 mg total) by mouth every evening.   30 tablet   0   . baclofen (LIORESAL) 10 MG tablet   Oral   Take 10 mg by mouth 3 (three) times daily as needed for muscle spasms.          . digoxin (LANOXIN) 0.25 MG tablet   Oral   Take 1 tablet (0.25 mg total) by mouth daily.   30 tablet   2   . digoxin (LANOXIN) 0.25 MG tablet   Oral   Take 1 tablet (0.25 mg total) by mouth daily.   30 tablet   0   . escitalopram (LEXAPRO) 20  MG tablet   Oral   Take 1 tablet (20 mg total) by mouth daily.   30 tablet   0   . furosemide (LASIX) 40 MG tablet   Oral   Take 1 tablet (40 mg total) by mouth at bedtime.   30 tablet   0   . furosemide (LASIX) 80 MG tablet   Oral   Take 1 tablet (80 mg total) by mouth daily.   30 tablet   0   . insulin glargine (LANTUS) 100 UNIT/ML injection   Subcutaneous   Inject 30 Units into the skin at bedtime.         . metolazone (ZAROXOLYN) 5 MG tablet   Oral   Take 5 mg by mouth daily as needed. pt. takes 1 tablet daily only if needed for 4-5 lbs weight gain, Pt. Is to take with morning lasix and 1 potassium tablet.         . metoprolol succinate (TOPROL-XL) 100 MG 24 hr tablet   Oral   Take 100 mg by mouth daily.          Marland Kitchen oxyCODONE (OXY IR/ROXICODONE) 5 MG immediate release tablet      1-2 tabs every 6 hours for moderate pain   30 tablet   0   . potassium chloride SA (K-DUR,KLOR-CON) 20 MEQ tablet   Oral   Take 1 tablet (20 mEq total) by mouth 2 (two) times daily. Patient taking differently: Take 20 mEq by mouth daily as needed (Pt. takes one tablet with the PRN metolazone).          Marland Kitchen zolpidem (AMBIEN) 5 MG tablet   Oral   Take 1 tablet (5 mg total) by mouth at bedtime as needed for sleep.   30 tablet   0     Allergies Codeine  Family History  Problem Relation Age of Onset  . CAD Father   . Diabetes Father   . Heart failure Father     Social History Social History  Substance Use Topics  . Smoking status: Former Smoker -- 1.50 packs/day for 23 years    Types: Cigarettes    Quit date: 08/27/2015  . Smokeless tobacco: Never Used  . Alcohol Use: No    Review of Systems Constitutional: No fever/chills Eyes: No visual changes. ENT: No sore throat. No stiff neck no neck pain Cardiovascular: Denies chest pain. Respiratory: Positive shortness of breath. Gastrointestinal:   Positive dry heaving .  No diarrhea.  No constipation. Genitourinary:  Negative for dysuria. Musculoskeletal: Negative lower extremity swelling Skin: Negative for rash. Neurological:  Negative for headaches, focal weakness or numbness. 10-point ROS otherwise negative.  ____________________________________________   PHYSICAL EXAM:  VITAL SIGNS: ED Triage Vitals  Enc Vitals Group     BP 01/26/16 0859 105/78 mmHg     Pulse Rate 01/26/16 0857 81     Resp 01/26/16 0857 22     Temp 01/26/16 0857 97.2 F (36.2 C)     Temp src --      SpO2 01/26/16 0857 97 %     Weight 01/26/16 0857 245 lb (111.131 kg)     Height 01/26/16 0857 5\' 9"  (1.753 m)     Head Cir --      Peak Flow --      Pain Score 01/26/16 0900 9     Pain Loc --      Pain Edu? --      Excl. in [place]? --     Constitutional: Alert and oriented. Well appearing and in no acute distress. Eyes: Conjunctivae are normal. PERRL. EOMI. Head: Atraumatic. Nose: No congestion/rhinnorhea. Mouth/Throat: Mucous membranes are moist.  Oropharynx non-erythematous. Neck: No stridor.   Nontender with no meningismus Cardiovascular: Normal rate, regular rhythm. Grossly normal heart sounds.  Good peripheral circulation. Respiratory: Normal respiratory effort.  No retractions. Lungs CTAB. Abdominal: There is a tense ascites there is no significant tenderness or rebound tenderness, no guarding,  Back:  There is no focal tenderness or step off there is no midline tenderness there are no lesions noted. there is no CVA tenderness Musculoskeletal: No lower extremity tenderness. No joint effusions, no DVT signs strong distal pulses no edema Neurologic:  Normal speech and language. No gross focal neurologic deficits are appreciated.  Skin:  Skin is warm, dry and intact. No rash noted. Psychiatric: Mood and affect are normal. Speech and behavior are normal.  ____________________________________________   LABS (all labs ordered are listed, but only abnormal results are displayed)  Labs Reviewed  CBC WITH  DIFFERENTIAL/PLATELET - Abnormal; Notable for the following:    HCT 39.2 (*)    RDW 16.6 (*)    Neutro Abs 7.5 (*)    Lymphs Abs 0.8 (*)    All other components within normal limits  COMPREHENSIVE METABOLIC PANEL  ETHANOL  LIPASE, BLOOD  TROPONIN I  BRAIN NATRIURETIC PEPTIDE  URINE DRUG SCREEN, QUALITATIVE ([facility] ONLY)  DIGOXIN LEVEL  MAGNESIUM   ____________________________________________  EKG  I personally interpreted any EKGs ordered by me or triage Patient with AV sequential pacemaker, rate 80 to ____________________________________________  RADIOLOGY  I reviewed any imaging ordered by me or triage that were performed during my shift and, if possible, patient and/or family made aware of any abnormal findings. ____________________________________________   PROCEDURES  Procedure(s) performed: None  Critical Care performed: None  ____________________________________________   INITIAL IMPRESSION / ASSESSMENT AND PLAN / ED COURSE  Pertinent labs & imaging results that were available during my care of the patient were reviewed by me and considered in my medical decision making (see chart for details).  Patient with a recurrent ascites, unclear if it is cardiogenic had a genetic or other. Patient in no acute distress but having significant dyspnea and clearly is uncomfortable with that. We'll likely need to be admitted for paracentesis and further evaluation. Blood work is pending. He is in no acute distress at this time. Nothing at this time to suggest SBP, white count is normal, no fever and no significant tenderness noted. ____________________________________________   FINAL CLINICAL IMPRESSION(S) / ED DIAGNOSES  Final diagnoses:  SOB (shortness of breath)      This chart was dictated using voice recognition software.  Despite best efforts to proofread,  errors can occur which can change meaning.     Schuyler Amor, MD 01/26/16 [phone]

## 2016-01-26 NOTE — Care Management Important Message (Signed)
Important Message  Patient Details  Name: Ronald Miller. MRN: [id number] Date of Birth: 07-13-1956   Medicare Important Message Given:  Yes    Branko Steeves A, RN 01/26/2016, 12:49 PM

## 2016-01-26 NOTE — Plan of Care (Signed)
Problem: Tissue Perfusion: Goal: Risk factors for ineffective tissue perfusion will decrease Outcome: Progressing Sub Q heparin     Problem: Education: Goal: Ability to demonstrate managment of disease process will improve Outcome: Not Progressing Not open to education about disease process

## 2016-01-27 ENCOUNTER — Inpatient Hospital Stay: Payer: Medicare HMO

## 2016-01-27 LAB — CBC
HEMATOCRIT: 36 % — AB (ref 40.0–52.0)
Hemoglobin: 11.9 g/dL — ABNORMAL LOW (ref 13.0–18.0)
MCH: 29.4 pg (ref 26.0–34.0)
MCHC: 33.1 g/dL (ref 32.0–36.0)
MCV: 88.8 fL (ref 80.0–100.0)
PLATELETS: 168 10*3/uL (ref 150–440)
RBC: 4.06 MIL/uL — AB (ref 4.40–5.90)
RDW: 16.3 % — AB (ref 11.5–14.5)
WBC: 6.7 10*3/uL (ref 3.8–10.6)

## 2016-01-27 LAB — BASIC METABOLIC PANEL
Anion gap: 9 (ref 5–15)
BUN: 41 mg/dL — AB (ref 6–20)
CHLORIDE: 96 mmol/L — AB (ref 101–111)
CO2: 30 mmol/L (ref 22–32)
Calcium: 8.2 mg/dL — ABNORMAL LOW (ref 8.9–10.3)
Creatinine, Ser: 1.42 mg/dL — ABNORMAL HIGH (ref 0.61–1.24)
GFR calc non Af Amer: 53 mL/min — ABNORMAL LOW (ref >60)
Glucose, Bld: 87 mg/dL (ref 65–99)
POTASSIUM: 2.9 mmol/L — AB (ref 3.5–5.1)
SODIUM: 135 mmol/L (ref 135–145)

## 2016-01-27 LAB — GLUCOSE, CAPILLARY
GLUCOSE-CAPILLARY: 211 mg/dL — AB (ref 65–99)
GLUCOSE-CAPILLARY: 141 mg/dL — AB (ref 65–99)
Glucose-Capillary: 77 mg/dL (ref 65–99)
Glucose-Capillary: 247 mg/dL — ABNORMAL HIGH (ref 65–99)

## 2016-01-27 LAB — MAGNESIUM: Magnesium: 1.4 mg/dL — ABNORMAL LOW (ref 1.7–2.4)

## 2016-01-27 LAB — POTASSIUM: POTASSIUM: 4.5 mmol/L (ref 3.5–5.1)

## 2016-01-27 MED ORDER — HYDROCODONE-ACETAMINOPHEN 5-325 MG PO TABS
1.0000 | ORAL_TABLET | Freq: Four times a day (QID) | ORAL | Status: DC | PRN
  Administered 2016-01-27 – 2016-01-28 (×3): 1 via ORAL
  Filled 2016-01-27 (×3): qty 1

## 2016-01-27 MED ORDER — POTASSIUM CHLORIDE CRYS ER 20 MEQ PO TBCR
40.0000 meq | EXTENDED_RELEASE_TABLET | ORAL | Status: AC
  Administered 2016-01-27 (×2): 40 meq via ORAL
  Filled 2016-01-27 (×2): qty 2

## 2016-01-27 MED ORDER — POTASSIUM CHLORIDE 10 MEQ/100ML IV SOLN
10.0000 meq | INTRAVENOUS | Status: DC
  Administered 2016-01-27: 10 meq via INTRAVENOUS
  Filled 2016-01-27 (×4): qty 100

## 2016-01-27 MED ORDER — ENOXAPARIN SODIUM 40 MG/0.4ML ~~LOC~~ SOLN
40.0000 mg | SUBCUTANEOUS | Status: DC
  Administered 2016-01-27 – 2016-01-28 (×2): 40 mg via SUBCUTANEOUS
  Filled 2016-01-27 (×2): qty 0.4

## 2016-01-27 MED ORDER — MAGNESIUM SULFATE 4 GM/100ML IV SOLN
4.0000 g | Freq: Once | INTRAVENOUS | Status: AC
  Administered 2016-01-27: 4 g via INTRAVENOUS
  Filled 2016-01-27: qty 100

## 2016-01-27 NOTE — Progress Notes (Signed)
Electrolyte CONSULT NOTE - INITIAL   Pharmacy Consult for potassium/magnesium supplementation Indication: hypokalemia  Allergies  Allergen Reactions  . Codeine Nausea And Vomiting    Patient Measurements: Height: 5\' 9"  (175.3 cm) Weight: 246 lb 11.2 oz (111.902 kg) IBW/kg (Calculated) : 70.7   Vital Signs: Temp: 97.6 F (36.4 C) (04/25 1129) BP: 106/75 mmHg (04/25 1514) Pulse Rate: 74 (04/25 1514) Intake/Output from previous day: 04/24 0701 - 04/25 0700 In: -  Out: 2760 [Urine:2760] Intake/Output from this shift: Total I/O In: 240 [P.O.:240] Out: -   Labs:  Recent Labs  01/26/16 0911 01/26/16 1218 01/27/16 0422  WBC 9.2 8.3 6.7  HGB 13.0 13.1 11.9*  HCT 39.2* 40.1 36.0*  PLT 180 202 168  CREATININE 1.34* 1.40* 1.42*  MG 1.5*  --  1.4*  ALBUMIN 3.6  --   --   PROT 7.0  --   --   AST 27  --   --   ALT 19  --   --   ALKPHOS 118  --   --   BILITOT 0.7  --   --    Estimated Creatinine Clearance: 69.1 mL/min (by C-G formula based on Cr of 1.42).   Microbiology: Recent Results (from the past 720 hour(s))  MRSA PCR Screening     Status: None   Collection Time: 01/13/16  4:59 AM  Result Value Ref Range Status   MRSA by PCR NEGATIVE NEGATIVE Final    Comment:        The GeneXpert MRSA Assay (FDA approved for NASAL specimens only), is one component of a comprehensive MRSA colonization surveillance program. It is not intended to diagnose MRSA infection nor to guide or monitor treatment for MRSA infections.     Medical History: Past Medical History  Diagnosis Date  . ASCVD (arteriosclerotic cardiovascular disease)   . Diabetes mellitus   . Hypertension   . History of cocaine abuse   . Tobacco abuse   . Depression   . Suicidal ideation   . Chronic systolic CHF (congestive heart failure) ([place])   . COPD (chronic obstructive pulmonary disease) ([place])   . Coronary artery disease     Medications:  Scheduled:  . aspirin EC  81 mg Oral Daily  .  atorvastatin  20 mg Oral QPM  . digoxin  0.25 mg Oral Daily  . docusate sodium  100 mg Oral BID  . enoxaparin (LOVENOX) injection  40 mg Subcutaneous Q24H  . escitalopram  20 mg Oral Daily  . furosemide  80 mg Intravenous Q12H  . insulin aspart  0-15 Units Subcutaneous TID WC  . insulin glargine  30 Units Subcutaneous QHS  . potassium chloride SA  20 mEq Oral BID   Infusions:    Assessment: Pharmacy consulted to supplement potassium/magnesium in a 60 yo male admitted for acute on chronic systolic CHF.  Per RN, patient unable to tolerate IV KCl due to burning.  K: 2.9, Mag: 1.4  Patient has received KCl 40 meq po x 2, Magnesium Sulfate 4 gm IV once and has scheduled KCl 20 meq po BID ordered.  At this point, he has received ~100 meq of KCl.  Goal of Therapy:  K: 3.5-5.1 Mag: 1.7-2.4  Plan:  Will recheck potassium at 1800 today to further assess supplementation needs. BMP/mag ordered in AM.  2000 K= 4.5. No further replacement necessary. Will f/u AM labs.   Ulice Dash D 01/27/2016,7:54 PM

## 2016-01-27 NOTE — Care Management (Signed)
have notified [facility] of readmission.

## 2016-01-27 NOTE — Progress Notes (Signed)
Pt refusing IV potasium, c/o burning in arm. Restarted another IV access and tried to dilute with the NS to infuse again, patient is still c/o pain, states "cut it off, I can not take it" Both IV sites working properly. Patient cannot tolerate, MD aware.

## 2016-01-27 NOTE — Progress Notes (Signed)
Electrolyte CONSULT NOTE - INITIAL   Pharmacy Consult for potassium/magnesium supplementation Indication: hypokalemia  Allergies  Allergen Reactions  . Codeine Nausea And Vomiting    Patient Measurements: Height: 5\' 9"  (175.3 cm) Weight: 246 lb 11.2 oz (111.902 kg) IBW/kg (Calculated) : 70.7   Vital Signs: Temp: 97.6 F (36.4 C) (04/25 1129) Temp Source: Oral (04/25 0358) BP: 106/75 mmHg (04/25 1514) Pulse Rate: 74 (04/25 1514) Intake/Output from previous day: 04/24 0701 - 04/25 0700 In: -  Out: 2760 [Urine:2760] Intake/Output from this shift: Total I/O In: 120 [P.O.:120] Out: 400 [Urine:400]  Labs:  Recent Labs  01/26/16 0911 01/26/16 1218 01/27/16 0422  WBC 9.2 8.3 6.7  HGB 13.0 13.1 11.9*  HCT 39.2* 40.1 36.0*  PLT 180 202 168  CREATININE 1.34* 1.40* 1.42*  MG 1.5*  --  1.4*  ALBUMIN 3.6  --   --   PROT 7.0  --   --   AST 27  --   --   ALT 19  --   --   ALKPHOS 118  --   --   BILITOT 0.7  --   --    Estimated Creatinine Clearance: 69.1 mL/min (by C-G formula based on Cr of 1.42).   Microbiology: Recent Results (from the past 720 hour(s))  MRSA PCR Screening     Status: None   Collection Time: 01/13/16  4:59 AM  Result Value Ref Range Status   MRSA by PCR NEGATIVE NEGATIVE Final    Comment:        The GeneXpert MRSA Assay (FDA approved for NASAL specimens only), is one component of a comprehensive MRSA colonization surveillance program. It is not intended to diagnose MRSA infection nor to guide or monitor treatment for MRSA infections.     Medical History: Past Medical History  Diagnosis Date  . ASCVD (arteriosclerotic cardiovascular disease)   . Diabetes mellitus   . Hypertension   . History of cocaine abuse   . Tobacco abuse   . Depression   . Suicidal ideation   . Chronic systolic CHF (congestive heart failure) ([place])   . COPD (chronic obstructive pulmonary disease) ([place])   . Coronary artery disease     Medications:   Scheduled:  . aspirin EC  81 mg Oral Daily  . atorvastatin  20 mg Oral QPM  . digoxin  0.25 mg Oral Daily  . docusate sodium  100 mg Oral BID  . enoxaparin (LOVENOX) injection  40 mg Subcutaneous Q24H  . escitalopram  20 mg Oral Daily  . furosemide  80 mg Intravenous Q12H  . insulin aspart  0-15 Units Subcutaneous TID WC  . insulin glargine  30 Units Subcutaneous QHS  . potassium chloride SA  20 mEq Oral BID   Infusions:    Assessment: Pharmacy consulted to supplement potassium/magnesium in a 60 yo male admitted for acute on chronic systolic CHF.  Per RN, patient unable to tolerate IV KCl due to burning.  K: 2.9, Mag: 1.4  Patient has received KCl 40 meq po x 2, Magnesium Sulfate 4 gm IV once and has scheduled KCl 20 meq po BID ordered.  At this point, he has received ~100 meq of KCl.  Goal of Therapy:  K: 3.5-5.1 Mag: 1.7-2.4  Plan:  Will recheck potassium at 1800 today to further assess supplementation needs. BMP/mag ordered in AM.  Melayah Skorupski G 01/27/2016,3:42 PM

## 2016-01-27 NOTE — Progress Notes (Addendum)
[place] [facility] [place] NAME: Ronald Miller    MR#:  [id number]  DATE OF BIRTH:  May 23, 1956  SUBJECTIVE:  CHIEF COMPLAINT:   Chief Complaint  Patient presents with  . Shortness of Breath   Shortness of breath and leg edema. Abdominal distention. REVIEW OF SYSTEMS:  CONSTITUTIONAL: No fever, fatigue or weakness.  EYES: No blurred or double vision.  EARS, NOSE, AND THROAT: No tinnitus or ear pain.  RESPIRATORY: has cough, shortness of breath, no wheezing or hemoptysis.  CARDIOVASCULAR: No chest pain, orthopnea, edema.  GASTROINTESTINAL: No nausea, vomiting, diarrhea or abdominal pain. Abdominal distention. Positive ascites signs. GENITOURINARY: No dysuria, hematuria.  ENDOCRINE: No polyuria, nocturia,  HEMATOLOGY: No anemia, easy bruising or bleeding SKIN: No rash or lesion. MUSCULOSKELETAL: No joint pain or arthritis.   NEUROLOGIC: No tingling, numbness, weakness.  PSYCHIATRY: No anxiety or depression.   DRUG ALLERGIES:   Allergies  Allergen Reactions  . Codeine Nausea And Vomiting    VITALS:  Blood pressure 106/75, pulse 74, temperature 97.6 F (36.4 C), temperature source Oral, resp. rate 18, height 5\' 9"  (1.753 m), weight 111.902 kg (246 lb 11.2 oz), SpO2 95 %.  PHYSICAL EXAMINATION:  GENERAL:  60 y.o.-year-old patient lying in the bed with no acute distress.  morbid obese.  EYES: Pupils equal, round, reactive to light and accommodation. No scleral icterus. Extraocular muscles intact.  HEENT: Head atraumatic, normocephalic. Oropharynx and nasopharynx clear.  NECK:  Supple, no jugular venous distention. No thyroid enlargement, no tenderness.  LUNGS: Normal breath sounds bilaterally, no wheezing,  bilateral rales, no rhonchi or crepitation. No use of accessory muscles of respiration.  CARDIOVASCULAR: S1, S2 normal. No murmurs, rubs, or gallops.  ABDOMEN: Soft, nontender, nondistended. Bowel sounds present. No organomegaly or  mass.  EXTREMITIES: bilateral leg  Edema 1-2+,  No cyanosis, or clubbing.  NEUROLOGIC: Cranial nerves II through XII are intact. Muscle strength 5/5 in all extremities. Sensation intact. Gait not checked.  PSYCHIATRIC: The patient is alert and oriented x 3.  SKIN: No obvious rash, lesion, or ulcer.    LABORATORY PANEL:   CBC  Recent Labs Lab 01/27/16 0422  WBC 6.7  HGB 11.9*  HCT 36.0*  PLT 168   ------------------------------------------------------------------------------------------------------------------  Chemistries   Recent Labs Lab 01/26/16 0911  01/27/16 0422  NA 135  --  135  K 3.8  --  2.9*  CL 96*  --  96*  CO2 28  --  30  GLUCOSE 152*  --  87  BUN 36*  --  41*  CREATININE 1.34*  < > 1.42*  CALCIUM 8.6*  --  8.2*  MG 1.5*  --  1.4*  AST 27  --   --   ALT 19  --   --   ALKPHOS 118  --   --   BILITOT 0.7  --   --   < > = values in this interval not displayed. ------------------------------------------------------------------------------------------------------------------  Cardiac Enzymes  Recent Labs Lab 01/26/16 0911  TROPONINI 0.03   ------------------------------------------------------------------------------------------------------------------  RADIOLOGY:  Dg Chest 2 View  01/26/2016  CLINICAL DATA:  Shortness of breath. EXAM: CHEST  2 VIEW COMPARISON:  01/12/2016. FINDINGS: Prior CABG. AICD noted in stable position. Stable cardiomegaly. Low lung volumes with mild basilar atelectasis. No pleural effusion or pneumothorax. Degenerative changes thoracic spine . IMPRESSION: 1.  AICD in stable position.  Prior CABG.  Stable cardiomegaly. 2.  Low lung volumes with mild basilar atelectasis. Electronically  Signed   By: Marcello Moores  Register   On: 01/26/2016 09:45    EKG:   Orders placed or performed during the hospital encounter of 01/26/16  . EKG 12-Lead  . EKG 12-Lead  . ED EKG  . ED EKG    ASSESSMENT AND PLAN:   Acute on chronic systolic heart  failure EF 25% Continue IV Lasix 80 mg every 12 hours. Patient is on Toprol-XL but was decreased the dose secondary to bradycardia. Continue digoxin.  Abdominal distention with ascites,  secondary to CHF exacerbation:  Patient had a paracentesis done and several liters of fluid was removed during the recent admission.follow-up sound guided paracentesis.  Acute renal injury, prerenal due to CHF decompensation. Follow-up BMP.   Hypokalemia. Give potassium supplement and follow-up BMP. Hypomagnesemia. Give IV magnesium and a follow-up level.  Diabetes mellitus type 2, on sliding scale and Lantus 30 units at bedtime.  depression: Continue Lexapro 20 mg daily.   COPD. Stable. Nebulizer when necessary.   All the records are reviewed and case discussed with Care Management/Social Workerr. Management plans discussed with the patient, family and they are in agreement.  CODE STATUS: Full code  TOTAL TIME TAKING CARE OF THIS PATIENT: 39  minutes.  Greater than 50% time was spent on coordination of care and face-to-face counseling.  POSSIBLE D/C IN 3 DAYS, DEPENDING ON CLINICAL CONDITION.   Demetrios Loll M.D on 01/27/2016 at 4:06 PM  Between 7am to 6pm - Pager - [phone]  After 6pm go to [email or web address] - password [email or web address]  Office  [phone]  CC: Primary care physician; [facility]

## 2016-01-27 NOTE — Care Management (Signed)
Informed that patient declined to let Alvis Lemmings come into his home at last discharge.  Attempted to discuss with patient but he was off the unit for a paracentesis

## 2016-01-27 NOTE — Progress Notes (Signed)
Per Lab, pt's potasium level is 2.9 this morning, MD, Marcille Blanco notified. Dr. Aletha Halim put in orders for Potasium replacement. Will continue to monitor.

## 2016-01-27 NOTE — Progress Notes (Signed)
Confirmed patient does not need to be NPO for ultrasound paracentesis. Patient is eating lunch now. Ultrasound stated test can be performed this afternoon.

## 2016-01-27 NOTE — Progress Notes (Signed)
Inpatient Diabetes Program Recommendations  AACE/ADA: New Consensus Statement on Inpatient Glycemic Control (2015)  Target Ranges:  Prepandial:   less than 140 mg/dL      Peak postprandial:   less than 180 mg/dL (1-2 hours)      Critically ill patients:  140 - 180 mg/dL  Results for MCCADE, STUDLEY (MRN [id number]) as of 01/27/2016 11:37  Ref. Range 01/26/2016 12:23 01/26/2016 16:18 01/26/2016 20:51 01/27/2016 07:27 01/27/2016 11:26  Glucose-Capillary Latest Ref Range: 65-99 mg/dL 146 (H) 210 (H) 167 (H) 77 247 (H)   Review of Glycemic Control  Diabetes history: DM2 Outpatient Diabetes medications: Lantus 30 units QHS, Novolog 10 units TID with meals, Metformin 1000 mg BID Current orders for Inpatient glycemic control: Lantus 30 units QHS, Novolog 0-15 units TID with meals  Inpatient Diabetes Program Recommendations: Insulin - Meal Coverage: If patient is eating at least 50% of meals, please consider ordering Novolog 3 units TID with meals for meal coverage (in addition to Novolog correction scale).  Thanks, Barnie Alderman, RN, MSN, CDE Diabetes Coordinator Inpatient Diabetes Program [phone] (Team Pager from [place] to [place]) [phone] ([facility] office) [phone] [facility] office) [phone] [facility] office)

## 2016-01-27 NOTE — Progress Notes (Signed)
Patient has rested comfortably in the chair today. No complaints of pain. Only complaint was "I just feel funny" a few times, but is doing okay now. Paracentesis performed with 5L removed this afternoon. Vitals are stable. Will continue to monitor.

## 2016-01-28 LAB — BASIC METABOLIC PANEL WITH GFR
Anion gap: 7 (ref 5–15)
BUN: 33 mg/dL — ABNORMAL HIGH (ref 6–20)
CO2: 32 mmol/L (ref 22–32)
Calcium: 8.1 mg/dL — ABNORMAL LOW (ref 8.9–10.3)
Chloride: 91 mmol/L — ABNORMAL LOW (ref 101–111)
Creatinine, Ser: 1.15 mg/dL (ref 0.61–1.24)
GFR calc Af Amer: >60 mL/min
GFR calc non Af Amer: >60 mL/min
Glucose, Bld: 154 mg/dL — ABNORMAL HIGH (ref 65–99)
Potassium: 3.5 mmol/L (ref 3.5–5.1)
Sodium: 130 mmol/L — ABNORMAL LOW (ref 135–145)

## 2016-01-28 LAB — GLUCOSE, CAPILLARY
Glucose-Capillary: 183 mg/dL — ABNORMAL HIGH (ref 65–99)
Glucose-Capillary: 182 mg/dL — ABNORMAL HIGH (ref 65–99)
Glucose-Capillary: 169 mg/dL — ABNORMAL HIGH (ref 65–99)

## 2016-01-28 LAB — MAGNESIUM: MAGNESIUM: 1.9 mg/dL (ref 1.7–2.4)

## 2016-01-28 MED ORDER — SODIUM CHLORIDE 0.9% FLUSH
3.0000 mL | Freq: Two times a day (BID) | INTRAVENOUS | Status: DC
  Administered 2016-01-28 – 2016-01-29 (×3): 3 mL via INTRAVENOUS

## 2016-01-28 MED ORDER — FUROSEMIDE 10 MG/ML IJ SOLN
40.0000 mg | Freq: Two times a day (BID) | INTRAMUSCULAR | Status: DC
  Administered 2016-01-28 – 2016-01-29 (×2): 40 mg via INTRAVENOUS
  Filled 2016-01-28 (×2): qty 4

## 2016-01-28 MED ORDER — INSULIN ASPART 100 UNIT/ML ~~LOC~~ SOLN
3.0000 [IU] | Freq: Three times a day (TID) | SUBCUTANEOUS | Status: DC
  Administered 2016-01-28 – 2016-01-29 (×3): 3 [IU] via SUBCUTANEOUS
  Filled 2016-01-28 (×2): qty 3

## 2016-01-28 MED ORDER — SODIUM CHLORIDE 0.9% FLUSH
3.0000 mL | INTRAVENOUS | Status: DC | PRN
  Administered 2016-01-28 – 2016-01-29 (×2): 3 mL via INTRAVENOUS
  Filled 2016-01-28: qty 3

## 2016-01-28 NOTE — Progress Notes (Signed)
Patient has slept for most of the day. No complaints of pain. IV lasix transitioned down to 40mg  from 80mg  today. Vitals are stable. Per MD patient will likely discharge tomorrow. Will continue to monitor.

## 2016-01-28 NOTE — Care Management Important Message (Signed)
Important Message  Patient Details  Name: Ronald Miller. MRN: [id number] Date of Birth: 04/29/1956   Medicare Important Message Given:  Yes    Juliann Pulse A Essie Lagunes 01/28/2016, 11:20 AM

## 2016-01-28 NOTE — Clinical Documentation Improvement (Signed)
[facility] at [facility]  Please document query responses in the progress notes and discharge summary, not on the CDI BPA form.   Thank you.  Please document baseline, comparative, and/or historical data to support the diagnosis of "Acute Renal Failure"  Clinical Information: "acute renal failure: Due to Probably due to CHF exacerbation: Continue IV Lasix." is documented in the H&P. The patient has not had an improvement or worsening of 0.3 mg/dL in a 48 hour period this admission. BUN/Cr/GFR trend this admission     (white male) Component     Latest Ref Rng 01/26/2016 01/27/2016 01/28/2016  BUN     6 - 20 mg/dL 36 (H) 41 (H) 33 (H)  Creatinine     0.61 - 1.24 mg/dL 1.34 (H) 1.42 (H) 1.15  EGFR (Non-African Amer.)     >60 mL/min 56 (L) 53 (L) >60    Please exercise your independent, professional judgment when responding. A specific answer is not anticipated or expected.   Thank You, Erling Conte  RN BSN CCDS [phone] Health Information Management [place]

## 2016-01-28 NOTE — Care Management (Signed)
Patient makes no remark when attempt to discuss reason he declined home health.  he asks CM to "come back tomorrow- then I'll talk."

## 2016-01-28 NOTE — Progress Notes (Addendum)
Inpatient Diabetes Program Recommendations  AACE/ADA: New Consensus Statement on Inpatient Glycemic Control (2015)  Target Ranges:  Prepandial:   less than 140 mg/dL      Peak postprandial:   less than 180 mg/dL (1-2 hours)      Critically ill patients:  140 - 180 mg/dL  Results for JAQUEL, GREENLEAF (MRN [id number]) as of 01/28/2016 10:21  Ref. Range 01/27/2016 07:27 01/27/2016 11:26 01/27/2016 16:28 01/27/2016 21:20  Glucose-Capillary Latest Ref Range: 65-99 mg/dL 77 247 (H) 211 (H) 141 (H)   Review of Glycemic Control  Diabetes history: DM2 Outpatient Diabetes medications: Lantus 30 units QHS, Novolog 10 units TID with meals Current orders for Inpatient glycemic control: Lantus 30 units QHS, Novolog 0-15 units TID with meals  Inpatient Diabetes Program Recommendations: Insulin - Meal Coverage: Post prandial glucose is consistently elevated.  If patient is eating at least 50% of meals, please consider ordering Novolog 3 units TID with meals for meal coverage (in addition to Novolog correction scale).  Addendum 01/28/16@10 :69- Spoke with patient about diabetes and home regimen for diabetes control. Patient reports that he is followed by PCP for diabetes management and currently he takes Lantus 30 units QHS and Novolog 10-12 units TID with meals as an outpatient for diabetes control. Inquired about Metformin from last hospitalization medication list and patient states that he took himself off the Metformin because he was having nausea and diarrhea with it.  Patient reports that he is taking insulin as prescribed. Patient states that he checks his glucose 2-3 times per day and that it is usually 100-170 mg/dl in the morning and goes up in the evening.  Patient states that he needs a new glucometer. Will ask that MD provide patient with a prescription for a new glucometer at time of discharge. Noted last A1C was 8.4% on 01/13/16 and explained that his current A1C indicates an average glucose of 194 mg/dl  over the past 2-3 months. Discussed glucose and A1C goals. Patient reports that he does not take his glucometer or glucose results to his follow up doctor appointments.  Encouraged patient to check his glucose 3-4 times per day (before meals and at bedtime) and to keep a log book of glucose readings and insulin taken which he will need to take to doctor appointments. Explained how the doctor he follows up with can use the log book to continue to make insulin adjustments if needed.Patient reports that he would like more information on Carb Modified diet and meal planning for a single person. Will place consult for RD to see while inpatient.  Encouraged patient to let his PCP know that he stopped taking Metformin and ask about extended release Metformin which may have less effect on GI system. Patient verbalized understanding of information discussed and he states that he has no further questions at this time related to diabetes.  Thanks, Barnie Alderman, RN, MSN, CDE Diabetes Coordinator Inpatient Diabetes Program [phone] (Team Pager from [place] to [place]) [phone] ([facility] office) [phone] [facility] office) [phone] [facility] office)

## 2016-01-28 NOTE — Progress Notes (Signed)
[place] [facility] [place] NAME: Ronald Miller    MR#:  [id number]  DATE OF BIRTH:  [birth year]  SUBJECTIVE:  CHIEF COMPLAINT:   Chief Complaint  Patient presents with  . Shortness of Breath   Better shortness of breath and leg edema. Better abdominal distention. REVIEW OF SYSTEMS:  CONSTITUTIONAL: No fever, has generalized weakness.  EYES: No blurred or double vision.  EARS, NOSE, AND THROAT: No tinnitus or ear pain.  RESPIRATORY: has cough, shortness of breath, no wheezing or hemoptysis.  CARDIOVASCULAR: No chest pain, orthopnea, edema.  GASTROINTESTINAL: No nausea, vomiting, diarrhea or abdominal pain. Abdominal distention. Positive ascites signs. GENITOURINARY: No dysuria, hematuria.  ENDOCRINE: No polyuria, nocturia,  HEMATOLOGY: No anemia, easy bruising or bleeding SKIN: No rash or lesion. MUSCULOSKELETAL: No joint pain or arthritis.   NEUROLOGIC: No tingling, numbness, weakness.  PSYCHIATRY: No anxiety or depression.   DRUG ALLERGIES:   Allergies  Allergen Reactions  . Codeine Nausea And Vomiting    VITALS:  Blood pressure 103/70, pulse 87, temperature 97.6 F (36.4 C), temperature source Oral, resp. rate 18, height 5\' 9"  (1.753 m), weight 108.319 kg (238 lb 12.8 oz), SpO2 92 %.  PHYSICAL EXAMINATION:  GENERAL:  60 y.o.-year-old patient lying in the bed with no acute distress.  morbid obese.  EYES: Pupils equal, round, reactive to light and accommodation. No scleral icterus. Extraocular muscles intact.  HEENT: Head atraumatic, normocephalic. Oropharynx and nasopharynx clear.  NECK:  Supple, no jugular venous distention. No thyroid enlargement, no tenderness.  LUNGS: Normal breath sounds bilaterally, no wheezing,  bilateral rales, no rhonchi or crepitation. No use of accessory muscles of respiration.  CARDIOVASCULAR: S1, S2 normal. No murmurs, rubs, or gallops.  ABDOMEN: Soft, nontender, distended but soft. Bowel sounds  present. No organomegaly or mass.  EXTREMITIES: bilateral leg  Edema 1+,  No cyanosis, or clubbing.  NEUROLOGIC: Cranial nerves II through XII are intact. Muscle strength 5/5 in all extremities. Sensation intact. Gait not checked.  PSYCHIATRIC: The patient is alert and oriented x 3.  SKIN: No obvious rash, lesion, or ulcer.    LABORATORY PANEL:   CBC  Recent Labs Lab 01/27/16 0422  WBC 6.7  HGB 11.9*  HCT 36.0*  PLT 168   ------------------------------------------------------------------------------------------------------------------  Chemistries   Recent Labs Lab 01/26/16 0911  01/28/16 0505  NA 135  < > 130*  K 3.8  < > 3.5  CL 96*  < > 91*  CO2 28  < > 32  GLUCOSE 152*  < > 154*  BUN 36*  < > 33*  CREATININE 1.34*  < > 1.15  CALCIUM 8.6*  < > 8.1*  MG 1.5*  < > 1.9  AST 27  --   --   ALT 19  --   --   ALKPHOS 118  --   --   BILITOT 0.7  --   --   < > = values in this interval not displayed. ------------------------------------------------------------------------------------------------------------------  Cardiac Enzymes  Recent Labs Lab 01/26/16 0911  TROPONINI 0.03   ------------------------------------------------------------------------------------------------------------------  RADIOLOGY:  US Paracentesis  01/27/2016  CLINICAL DATA:  CHF, recurrent ascites EXAM: ULTRASOUND GUIDED PARACENTESIS TECHNIQUE: The procedure, risks (including but not limited to bleeding, infection, organ damage ), benefits, and alternatives were explained to the patient. Questions regarding the procedure were encouraged and answered. The patient understands and consents to the procedure. Survey ultrasound of the abdomen was performed and an appropriate skin entry site in  the right lateral abdomen was selected. Skin site was marked, prepped with Betadine, and draped in usual sterile fashion, and infiltrated locally with 1% lidocaine. A Safe-T-Centesis needle was advanced into the  peritoneal space until fluid could be aspirated. The sheath was advanced and the needle removed. 5 L of clear yellowascites were aspirated. COMPLICATIONS: COMPLICATIONS none IMPRESSION: Technically successful ultrasound guided paracentesis, removing 5 L of ascites. Electronically Signed   By: Lucrezia Europe M.D.   On: 01/27/2016 16:07    EKG:   Orders placed or performed during the hospital encounter of 01/26/16  . EKG 12-Lead  . EKG 12-Lead  . ED EKG  . ED EKG    ASSESSMENT AND PLAN:   Acute on chronic systolic heart failure EF 25% Decreased IV Lasix to 40 mg every 12 hours. Patient is on Toprol-XL but was decreased the dose secondary to bradycardia which improved. Continue digoxin.  Abdominal distention with ascites,  secondary to CHF exacerbation:  Patient had a paracentesis done and several liters of fluid was removed during the recent admission.  He got ultrasound guided paracentesis with 5 L of fluid removed yesterday.  Acute renal injury, prerenal due to CHF decompensation. Better. Follow-up BMP.   Hyponatremia. Decrease Lasix dose and follow-up BMP. Hypokalemia. Improved with potassium supplement. Continue supplements. Hypomagnesemia. Improved with IV magnesium.  Diabetes mellitus type 2, on sliding scale and Lantus 30 units at bedtime. Controlled.  depression: Continue Lexapro 20 mg daily.   COPD. Stable. Nebulizer when necessary.   All the records are reviewed and case discussed with Care Management/Social Workerr. Management plans discussed with the patient, family and they are in agreement.  CODE STATUS: Full code  TOTAL TIME TAKING CARE OF THIS PATIENT: 37  minutes.  Greater than 50% time was spent on coordination of care and face-to-face counseling.  POSSIBLE D/C IN 2 DAYS, DEPENDING ON CLINICAL CONDITION.   Demetrios Loll M.D on 01/28/2016 at 2:30 PM  Between 7am to 6pm - Pager - [phone]  After 6pm go to [email or web address] - password [email or web address]  Office  [phone]  CC: Primary care physician; [facility]

## 2016-01-28 NOTE — Plan of Care (Signed)
Problem: Food- and Nutrition-Related Knowledge Deficit (NB-1.1) Goal: Nutrition education Formal process to instruct or train a patient/client in a skill or to impart knowledge to help patients/clients voluntarily manage or modify food choices and eating behavior to maintain or improve health.  Outcome: Completed/Met Date Met:  01/28/16    RD consulted for nutrition education regarding diabetes.     Lab Results  Component Value Date    HGBA1C 8.4* 01/13/2016    RD notes pt has been educated on multiple previous admissions regarding DM as well as CHF nutrition therapy.  Pt able to tell writer foods high in carbohydrates such as pasta, potatoes, corn, bread.  Pt reports being knowledgeable of other food items such as barbeque sauce having sugar.  Pt reports cooking all of his meals and will for example bake chicken and add sauce.  Pt reports recently eating frozen pizzas with friend. RD discussed need to increase vegetable intake per dietary recall.  RD discussed portion control and other food options low in carbohydrates. RD also discussed/reviewed low sodium intake as pt admitted with CHF and fluid overload. Pt also asking about foods high in saturated fats, RD reviewed foods for pt.  RD provided "Carbohydrate Counting for People with Diabetes" handout from the Academy of Nutrition and Dietetics. Discussed different food groups and their effects on blood sugar, emphasizing carbohydrate-containing foods. Provided list of carbohydrates and recommended serving sizes of common foods.  Teach back method used.  Expect good compliance.  Body mass index is 35.25 kg/(m^2). Pt meets criteria for obesity unspecified based on current BMI.  Current diet order is Heart/Carb diet order, patient is consuming approximately 75-100% of meals at this time. Labs and medications reviewed. No further nutrition interventions warranted at this time. RD contact information provided. If additional nutrition issues  arise, please re-consult RD.  Dwyane Luo, RD, LDN Pager [phone] Weekend/On-Call Pager [phone]

## 2016-01-28 NOTE — Progress Notes (Signed)
Electrolyte CONSULT NOTE - INITIAL   Pharmacy Consult for potassium/magnesium supplementation Indication: hypokalemia  Allergies  Allergen Reactions  . Codeine Nausea And Vomiting    Patient Measurements: Height: 5\' 9"  (175.3 cm) Weight: 238 lb 12.8 oz (108.319 kg) IBW/kg (Calculated) : 70.7   Vital Signs: Temp: 97.7 F (36.5 C) (04/26 0456) Temp Source: Oral (04/26 0456) BP: 113/77 mmHg (04/26 0456) Pulse Rate: 86 (04/26 0456) Intake/Output from previous day: 04/25 0701 - 04/26 0700 In: 636 [P.O.:636] Out: 2400 [Urine:2400] Intake/Output from this shift: Total I/O In: -  Out: 1150 [Urine:1150]  Labs:  Recent Labs  01/26/16 0911 01/26/16 1218 01/27/16 0422 01/28/16 0505  WBC 9.2 8.3 6.7  --   HGB 13.0 13.1 11.9*  --   HCT 39.2* 40.1 36.0*  --   PLT 180 202 168  --   CREATININE 1.34* 1.40* 1.42* 1.15  MG 1.5*  --  1.4* 1.9  ALBUMIN 3.6  --   --   --   PROT 7.0  --   --   --   AST 27  --   --   --   ALT 19  --   --   --   ALKPHOS 118  --   --   --   BILITOT 0.7  --   --   --    Estimated Creatinine Clearance: 83.8 mL/min (by C-G formula based on Cr of 1.15).   Microbiology: Recent Results (from the past 720 hour(s))  MRSA PCR Screening     Status: None   Collection Time: 01/13/16  4:59 AM  Result Value Ref Range Status   MRSA by PCR NEGATIVE NEGATIVE Final    Comment:        The GeneXpert MRSA Assay (FDA approved for NASAL specimens only), is one component of a comprehensive MRSA colonization surveillance program. It is not intended to diagnose MRSA infection nor to guide or monitor treatment for MRSA infections.     Medical History: Past Medical History  Diagnosis Date  . ASCVD (arteriosclerotic cardiovascular disease)   . Diabetes mellitus   . Hypertension   . History of cocaine abuse   . Tobacco abuse   . Depression   . Suicidal ideation   . Chronic systolic CHF (congestive heart failure) ([place])   . COPD (chronic obstructive  pulmonary disease) ([place])   . Coronary artery disease     Medications:  Scheduled:  . aspirin EC  81 mg Oral Daily  . atorvastatin  20 mg Oral QPM  . digoxin  0.25 mg Oral Daily  . docusate sodium  100 mg Oral BID  . enoxaparin (LOVENOX) injection  40 mg Subcutaneous Q24H  . escitalopram  20 mg Oral Daily  . furosemide  80 mg Intravenous Q12H  . insulin aspart  0-15 Units Subcutaneous TID WC  . insulin glargine  30 Units Subcutaneous QHS  . potassium chloride SA  20 mEq Oral BID  . sodium chloride flush  3 mL Intravenous Q12H   Infusions:    Assessment: Pharmacy consulted to supplement potassium/magnesium in a 60 yo male admitted for acute on chronic systolic CHF.  Per RN, patient unable to tolerate IV KCl due to burning.  K: 3.5, Mag: 1.9  Goal of Therapy:  K: 3.5-5.1 Mag: 1.7-2.4  Plan:  No supplementation needed at this time.  Patient has orders for KCl 20 meq po BID.  Will recheck BMP/magnesium in AM.   Murrell Converse, PharmD Clinical Pharmacist 01/28/2016

## 2016-01-28 NOTE — Plan of Care (Signed)
Problem: Bowel/Gastric: Goal: Will not experience complications related to bowel motility Outcome: Not Progressing Patient is not moving regularly, administer stimulant as needed.

## 2016-01-29 LAB — BASIC METABOLIC PANEL
ANION GAP: 11 (ref 5–15)
BUN: 27 mg/dL — ABNORMAL HIGH (ref 6–20)
CHLORIDE: 91 mmol/L — AB (ref 101–111)
CO2: 31 mmol/L (ref 22–32)
Calcium: 8.1 mg/dL — ABNORMAL LOW (ref 8.9–10.3)
Creatinine, Ser: 1.03 mg/dL (ref 0.61–1.24)
GFR calc non Af Amer: >60 mL/min (ref >60)
Glucose, Bld: 151 mg/dL — ABNORMAL HIGH (ref 65–99)
POTASSIUM: 3.7 mmol/L (ref 3.5–5.1)
SODIUM: 133 mmol/L — AB (ref 135–145)

## 2016-01-29 LAB — GLUCOSE, CAPILLARY
GLUCOSE-CAPILLARY: 155 mg/dL — AB (ref 65–99)
Glucose-Capillary: 105 mg/dL — ABNORMAL HIGH (ref 65–99)

## 2016-01-29 LAB — MAGNESIUM: Magnesium: 1.6 mg/dL — ABNORMAL LOW (ref 1.7–2.4)

## 2016-01-29 MED ORDER — MAGNESIUM SULFATE 4 GM/100ML IV SOLN
4.0000 g | Freq: Once | INTRAVENOUS | Status: AC
  Administered 2016-01-29: 4 g via INTRAVENOUS
  Filled 2016-01-29: qty 100

## 2016-01-29 NOTE — Progress Notes (Signed)
A & O. Ambulating in the room and tolerated it well. Room air. Paced. Pt has not reported pain. IV and tele removed. Discharge isntructions given to pt. Pt has no further concerns at this time.

## 2016-01-29 NOTE — Care Management (Addendum)
5 liters of fluid removed 4/25 during paracentesis. Patient is now agreeable to home health and discussed with patient and attending of the need for close follow up with PCP so can coordinate outpatient management and paracentesis for the ascites that is due to his CHF. He says the reason he refused to have [facility] to see him is because when he or they called, was informed that his nurse had had a heart attack..  then "would change the story and was told the nurse was on vacation."  Requests another agency that is in network with his [employer].  Referral has been called to [facility].  Awaiting call back

## 2016-01-29 NOTE — Discharge Summary (Addendum)
[facility] NAME: Ronald Miller    MR#:  [id number]  DATE OF BIRTH:  Sep 04, 1956  DATE OF ADMISSION:  01/26/2016 ADMITTING PHYSICIAN: Epifanio Lesches, MD  DATE OF DISCHARGE: 01/29/2016  PRIMARY CARE PHYSICIAN: [facility]    ADMISSION DIAGNOSIS:  SOB (shortness of breath) [R06.02] Ascites [R18.8]   DISCHARGE DIAGNOSIS:  Acute on chronic systolic heart failure EF 25% Abdominal distention with ascites Acute renal injury Hyponatremia SECONDARY DIAGNOSIS:   Past Medical History  Diagnosis Date  . ASCVD (arteriosclerotic cardiovascular disease)   . Diabetes mellitus   . Hypertension   . History of cocaine abuse   . Tobacco abuse   . Depression   . Suicidal ideation   . Chronic systolic CHF (congestive heart failure) ([place])   . COPD (chronic obstructive pulmonary disease) ([place])   . Coronary artery disease     HOSPITAL COURSE:   Acute on chronic systolic heart failure EF 25% He was treated with Lasix IV 80 mg twice a day and decreased IV Lasix to 40 mg every 12 hours. Lasix dose will be decreased to home dose 40 mg daily after discharge due to low side blood pressure.  Patient was on Toprol-XL but was decreased the dose secondary to bradycardia which improved. Toprol was discontinued due to low blood pressure. Continue digoxin.  Abdominal distention with ascites, secondary to CHF exacerbation:  Patient had a paracentesis done and several liters of fluid was removed during the recent admission.  He got ultrasound guided paracentesis with 5 L of fluid removed the day before yesterday. Abdominal distention is much better.  Acute renal injury, prerenal due to CHF decompensation. Improved.  Hyponatremia. Decreases Lasix dose and improving.  Hypokalemia. Improved with potassium supplement. Continue supplements. Hypomagnesemia. Improved with IV magnesium.  Diabetes mellitus type 2, on sliding scale  and Lantus 30 units at bedtime. Controlled.  depression: Continue Lexapro 20 mg daily.  COPD. Stable. Nebulizer when necessary.   He has multiple medical problems and multiple paracentesis due to ascites. He needs home health. DISCHARGE CONDITIONS:   Stable, discharge to home with [facility] today.  CONSULTS OBTAINED:     DRUG ALLERGIES:   Allergies  Allergen Reactions  . Codeine Nausea And Vomiting    DISCHARGE MEDICATIONS:   Current Discharge Medication List    CONTINUE these medications which have NOT CHANGED   Details  acetaminophen (TYLENOL) 500 MG tablet Take 1,000 mg by mouth every 6 (six) hours as needed for mild pain or headache.    albuterol (PROVENTIL HFA;VENTOLIN HFA) 108 (90 BASE) MCG/ACT inhaler Inhale 2 puffs into the lungs every 6 (six) hours as needed for wheezing or shortness of breath.    aspirin EC 81 MG tablet Take 81 mg by mouth daily.    atorvastatin (LIPITOR) 20 MG tablet Take 1 tablet (20 mg total) by mouth every evening. Qty: 30 tablet, Refills: 0    digoxin (LANOXIN) 0.25 MG tablet Take 1 tablet (0.25 mg total) by mouth daily. Qty: 30 tablet, Refills: 2    escitalopram (LEXAPRO) 20 MG tablet Take 1 tablet (20 mg total) by mouth daily. Qty: 30 tablet, Refills: 0    furosemide (LASIX) 40 MG tablet Take 1 tablet (40 mg total) by mouth at bedtime. Qty: 30 tablet, Refills: 0    insulin aspart (NOVOLOG) 100 UNIT/ML injection Inject 10-12 Units into the skin 3 (three) times daily with meals.    insulin glargine (LANTUS) 100  UNIT/ML injection Inject 30 Units into the skin at bedtime.    metolazone (ZAROXOLYN) 5 MG tablet Take 5 mg by mouth daily as needed. pt. takes 1 tablet daily only if needed for 4-5 lbs weight gain, Pt. Is to take with morning lasix and 1 potassium tablet.    oxyCODONE (OXY IR/ROXICODONE) 5 MG immediate release tablet 1-2 tabs every 6 hours for moderate pain Qty: 30 tablet, Refills: 0    potassium chloride SA (K-DUR,KLOR-CON) 20  MEQ tablet Take 1 tablet (20 mEq total) by mouth 2 (two) times daily.    zolpidem (AMBIEN) 5 MG tablet Take 1 tablet (5 mg total) by mouth at bedtime as needed for sleep. Qty: 30 tablet, Refills: 0      STOP taking these medications     baclofen (LIORESAL) 10 MG tablet      metoprolol succinate (TOPROL-XL) 100 MG 24 hr tablet          DISCHARGE INSTRUCTIONS:   If you experience worsening of your admission symptoms, develop shortness of breath, life threatening emergency, suicidal or homicidal thoughts you must seek medical attention immediately by calling 911 or calling your MD immediately  if symptoms less severe.  You Must read complete instructions/literature along with all the possible adverse reactions/side effects for all the Medicines you take and that have been prescribed to you. Take any new Medicines after you have completely understood and accept all the possible adverse reactions/side effects.   Please note  You were cared for by a hospitalist during your hospital stay. If you have any questions about your discharge medications or the care you received while you were in the hospital after you are discharged, you can call the unit and asked to speak with the hospitalist on call if the hospitalist that took care of you is not available. Once you are discharged, your primary care physician will handle any further medical issues. Please note that NO REFILLS for any discharge medications will be authorized once you are discharged, as it is imperative that you return to your primary care physician (or establish a relationship with a primary care physician if you do not have one) for your aftercare needs so that they can reassess your need for medications and monitor your lab values.    Today   SUBJECTIVE   No complaint.   VITAL SIGNS:  Blood pressure 106/75, pulse 88, temperature 98.5 F (36.9 C), temperature source Oral, resp. rate 18, height 5\' 9"  (1.753 m), weight  108.319 kg (238 lb 12.8 oz), SpO2 93 %.  I/O:   Intake/Output Summary (Last 24 hours) at 01/29/16 1032 Last data filed at 01/29/16 1005  Gross per 24 hour  Intake   1316 ml  Output   3500 ml  Net  -2184 ml    PHYSICAL EXAMINATION:  GENERAL:  60 y.o.-year-old patient lying in the bed with no acute distress. Morbidly obese. EYES: Pupils equal, round, reactive to light and accommodation. No scleral icterus. Extraocular muscles intact.  HEENT: Head atraumatic, normocephalic. Oropharynx and nasopharynx clear.  NECK:  Supple, no jugular venous distention. No thyroid enlargement, no tenderness.  LUNGS: Normal breath sounds bilaterally, no wheezing, rales,rhonchi or crepitation. No use of accessory muscles of respiration.  CARDIOVASCULAR: S1, S2 normal. No murmurs, rubs, or gallops.  ABDOMEN: Soft, non-tender, distended. Bowel sounds present. No organomegaly or mass.  EXTREMITIES: No pedal edema, cyanosis, or clubbing.  NEUROLOGIC: Cranial nerves II through XII are intact. Muscle strength 5/5 in all extremities. Sensation  intact. Gait not checked.  PSYCHIATRIC: The patient is alert and oriented x 3.  SKIN: No obvious rash, lesion, or ulcer.   DATA REVIEW:   CBC  Recent Labs Lab 01/27/16 0422  WBC 6.7  HGB 11.9*  HCT 36.0*  PLT 168    Chemistries   Recent Labs Lab 01/26/16 0911  01/29/16 0505  NA 135  < > 133*  K 3.8  < > 3.7  CL 96*  < > 91*  CO2 28  < > 31  GLUCOSE 152*  < > 151*  BUN 36*  < > 27*  CREATININE 1.34*  < > 1.03  CALCIUM 8.6*  < > 8.1*  MG 1.5*  < > 1.6*  AST 27  --   --   ALT 19  --   --   ALKPHOS 118  --   --   BILITOT 0.7  --   --   < > = values in this interval not displayed.  Cardiac Enzymes  Recent Labs Lab 01/26/16 0911  TROPONINI 0.03    Microbiology Results  Results for orders placed or performed during the hospital encounter of 01/12/16  MRSA PCR Screening     Status: None   Collection Time: 01/13/16  4:59 AM  Result Value Ref  Range Status   MRSA by PCR NEGATIVE NEGATIVE Final    Comment:        The GeneXpert MRSA Assay (FDA approved for NASAL specimens only), is one component of a comprehensive MRSA colonization surveillance program. It is not intended to diagnose MRSA infection nor to guide or monitor treatment for MRSA infections.     RADIOLOGY:  US Paracentesis  01/27/2016  CLINICAL DATA:  CHF, recurrent ascites EXAM: ULTRASOUND GUIDED PARACENTESIS TECHNIQUE: The procedure, risks (including but not limited to bleeding, infection, organ damage ), benefits, and alternatives were explained to the patient. Questions regarding the procedure were encouraged and answered. The patient understands and consents to the procedure. Survey ultrasound of the abdomen was performed and an appropriate skin entry site in the right lateral abdomen was selected. Skin site was marked, prepped with Betadine, and draped in usual sterile fashion, and infiltrated locally with 1% lidocaine. A Safe-T-Centesis needle was advanced into the peritoneal space until fluid could be aspirated. The sheath was advanced and the needle removed. 5 L of clear yellowascites were aspirated. COMPLICATIONS: COMPLICATIONS none IMPRESSION: Technically successful ultrasound guided paracentesis, removing 5 L of ascites. Electronically Signed   By: Lucrezia Europe M.D.   On: 01/27/2016 16:07        Management plans discussed with the patient, family and they are in agreement.  CODE STATUS:     Code Status Orders        Start     Ordered   01/26/16 1051  Full code   Continuous     01/26/16 1055    Code Status History    Date Active Date Inactive Code Status Order ID Comments User Context   01/13/2016 12:49 AM 01/18/2016  2:59 PM Full Code [id number]  Lance Coon, MD Inpatient   10/27/2015  7:17 PM 11/03/2015  7:25 PM Full Code [id number]  Epifanio Lesches, MD ED   10/14/2015  1:39 AM 10/21/2015  8:57 PM Full Code [id number]  Saundra Shelling, MD Inpatient    08/28/2015  2:40 AM 09/01/2015  8:01 PM Full Code [id number]  Lance Coon, MD Inpatient    Advance Directive Documentation        Most  Recent Value   Type of Advance Directive  Living will   Pre-existing out of facility DNR order (yellow form or pink MOST form)     "MOST" Form in Place?        TOTAL TIME TAKING CARE OF THIS PATIENT: 36 minutes.    Demetrios Loll M.D on 01/29/2016 at 10:32 AM  Between 7am to 6pm - Pager - [phone]  After 6pm go to [email or web address] - password [email or web address] [facility]  Office  [phone]  CC: Primary care physician; [facility]

## 2016-01-29 NOTE — Progress Notes (Signed)
Electrolyte CONSULT NOTE - Follow Up  Pharmacy Consult for potassium/magnesium supplementation Indication: hypokalemia  Allergies  Allergen Reactions  . Codeine Nausea And Vomiting    Patient Measurements: Height: 5\' 9"  (175.3 cm) Weight: 238 lb 12.8 oz (108.319 kg) IBW/kg (Calculated) : 70.7   Vital Signs: Temp: 98.5 F (36.9 C) (04/27 0433) Temp Source: Oral (04/27 0433) BP: 106/75 mmHg (04/27 0434) Pulse Rate: 88 (04/27 0434) Intake/Output from previous day: 04/26 0701 - 04/27 0700 In: [id number] [P.O.:1196; I.V.:3] Out: 5150 [Urine:5150] Intake/Output from this shift:    Labs:  Recent Labs  01/26/16 0911 01/26/16 1218 01/27/16 0422 01/28/16 0505 01/29/16 0505  WBC 9.2 8.3 6.7  --   --   HGB 13.0 13.1 11.9*  --   --   HCT 39.2* 40.1 36.0*  --   --   PLT 180 202 168  --   --   CREATININE 1.34* 1.40* 1.42* 1.15 1.03  MG 1.5*  --  1.4* 1.9 1.6*  ALBUMIN 3.6  --   --   --   --   PROT 7.0  --   --   --   --   AST 27  --   --   --   --   ALT 19  --   --   --   --   ALKPHOS 118  --   --   --   --   BILITOT 0.7  --   --   --   --    Estimated Creatinine Clearance: 93.6 mL/min (by C-G formula based on Cr of 1.03).   Microbiology: Recent Results (from the past 720 hour(s))  MRSA PCR Screening     Status: None   Collection Time: 01/13/16  4:59 AM  Result Value Ref Range Status   MRSA by PCR NEGATIVE NEGATIVE Final    Comment:        The GeneXpert MRSA Assay (FDA approved for NASAL specimens only), is one component of a comprehensive MRSA colonization surveillance program. It is not intended to diagnose MRSA infection nor to guide or monitor treatment for MRSA infections.     Medical History: Past Medical History  Diagnosis Date  . ASCVD (arteriosclerotic cardiovascular disease)   . Diabetes mellitus   . Hypertension   . History of cocaine abuse   . Tobacco abuse   . Depression   . Suicidal ideation   . Chronic systolic CHF (congestive heart  failure) ([place])   . COPD (chronic obstructive pulmonary disease) ([place])   . Coronary artery disease     Medications:  Scheduled:  . aspirin EC  81 mg Oral Daily  . atorvastatin  20 mg Oral QPM  . digoxin  0.25 mg Oral Daily  . docusate sodium  100 mg Oral BID  . enoxaparin (LOVENOX) injection  40 mg Subcutaneous Q24H  . escitalopram  20 mg Oral Daily  . furosemide  40 mg Intravenous Q12H  . insulin aspart  0-15 Units Subcutaneous TID WC  . insulin aspart  3 Units Subcutaneous TID WC  . insulin glargine  30 Units Subcutaneous QHS  . magnesium sulfate 1 - 4 g bolus IVPB  4 g Intravenous Once  . potassium chloride SA  20 mEq Oral BID  . sodium chloride flush  3 mL Intravenous Q12H   Infusions:    Assessment: Pharmacy consulted to supplement potassium/magnesium in a 60 yo male admitted for acute on chronic systolic CHF.  Per RN, patient unable to  tolerate IV KCl due to burning.  K: 3.7, Mag: 1.6  Goal of Therapy:  K: 3.5-5.1 Mag: 1.7-2.4  Plan:  MD ordered Magnesium 4 gm IV once.  Patient has orders for KCl 20 meq po BID.  No further supplementation is warranted. Will recheck BMP/magnesium in AM.   Murrell Converse, PharmD Clinical Pharmacist 01/29/2016

## 2016-01-29 NOTE — Discharge Instructions (Signed)
Heart healthy and ADA diet. Activity as tolerated. [place]

## 2016-01-30 ENCOUNTER — Other Ambulatory Visit (INDEPENDENT_AMBULATORY_CARE_PROVIDER_SITE_OTHER): Payer: Self-pay | Admitting: Cardiovascular Disease

## 2016-02-03 ENCOUNTER — Encounter: Payer: Self-pay | Admitting: Family

## 2016-02-03 ENCOUNTER — Ambulatory Visit: Payer: Medicare HMO | Attending: Family | Admitting: Family

## 2016-02-03 VITALS — BP 94/65 | HR 88 | Resp 20 | Ht 69.0 in | Wt 242.0 lb

## 2016-02-03 DIAGNOSIS — Z95810 Presence of automatic (implantable) cardiac defibrillator: Secondary | ICD-10-CM | POA: Insufficient documentation

## 2016-02-03 DIAGNOSIS — I2510 Atherosclerotic heart disease of native coronary artery without angina pectoris: Secondary | ICD-10-CM | POA: Diagnosis not present

## 2016-02-03 DIAGNOSIS — Z8249 Family history of ischemic heart disease and other diseases of the circulatory system: Secondary | ICD-10-CM | POA: Insufficient documentation

## 2016-02-03 DIAGNOSIS — I5022 Chronic systolic (congestive) heart failure: Secondary | ICD-10-CM | POA: Insufficient documentation

## 2016-02-03 DIAGNOSIS — E119 Type 2 diabetes mellitus without complications: Secondary | ICD-10-CM | POA: Diagnosis not present

## 2016-02-03 DIAGNOSIS — Z9049 Acquired absence of other specified parts of digestive tract: Secondary | ICD-10-CM | POA: Insufficient documentation

## 2016-02-03 DIAGNOSIS — F329 Major depressive disorder, single episode, unspecified: Secondary | ICD-10-CM | POA: Insufficient documentation

## 2016-02-03 DIAGNOSIS — Z87891 Personal history of nicotine dependence: Secondary | ICD-10-CM | POA: Insufficient documentation

## 2016-02-03 DIAGNOSIS — Z9889 Other specified postprocedural states: Secondary | ICD-10-CM | POA: Diagnosis not present

## 2016-02-03 DIAGNOSIS — I110 Hypertensive heart disease with heart failure: Secondary | ICD-10-CM | POA: Insufficient documentation

## 2016-02-03 DIAGNOSIS — Z7982 Long term (current) use of aspirin: Secondary | ICD-10-CM | POA: Insufficient documentation

## 2016-02-03 DIAGNOSIS — J449 Chronic obstructive pulmonary disease, unspecified: Secondary | ICD-10-CM | POA: Diagnosis not present

## 2016-02-03 DIAGNOSIS — Z79899 Other long term (current) drug therapy: Secondary | ICD-10-CM | POA: Diagnosis not present

## 2016-02-03 DIAGNOSIS — I10 Essential (primary) hypertension: Secondary | ICD-10-CM

## 2016-02-03 DIAGNOSIS — Z794 Long term (current) use of insulin: Secondary | ICD-10-CM | POA: Insufficient documentation

## 2016-02-03 DIAGNOSIS — G4700 Insomnia, unspecified: Secondary | ICD-10-CM | POA: Insufficient documentation

## 2016-02-03 NOTE — Patient Instructions (Addendum)
Continue weighing daily and call for an overnight weight gain of > 2 pounds or a weekly weight gain of >5 pounds.  Will make a referral to the [facility] in [place]

## 2016-02-03 NOTE — Progress Notes (Signed)
Subjective:    Patient ID: Ronald Fusi., male    DOB: 01/26/1956, 61 y.o.   MRN: [id number]  Congestive Heart Failure Presents for follow-up visit. The disease course has been worsening. Associated symptoms include abdominal pain, edema, fatigue, orthopnea and shortness of breath. Pertinent negatives include no chest pain, chest pressure, palpitations or unexpected weight change. Past treatments include digoxin and salt and fluid restriction. The treatment provided mild relief. Compliance with prior treatments has been variable. Prior compliance problems include medication issues (at times due to cost). His past medical history is significant for CAD, chronic lung disease, DM and HTN. There is no history of CVA. He has one 1st degree relative with heart disease.  Hypertension This is a chronic problem. The current episode started more than 1 year ago. The problem is unchanged. The problem is controlled. Associated symptoms include peripheral edema and shortness of breath. Pertinent negatives include no chest pain, headaches, neck pain or palpitations. There are no associated agents to hypertension. Risk factors for coronary artery disease include diabetes mellitus, family history, male gender, sedentary lifestyle and stress. Past treatments include diuretics and lifestyle changes. The current treatment provides moderate improvement. Compliance problems include exercise.  Hypertensive end-organ damage includes CAD/MI and heart failure.   Past Medical History  Diagnosis Date  . ASCVD (arteriosclerotic cardiovascular disease)   . Diabetes mellitus   . Hypertension   . History of cocaine abuse   . Tobacco abuse   . Depression   . Suicidal ideation   . Chronic systolic CHF (congestive heart failure) ([place])   . COPD (chronic obstructive pulmonary disease) ([place])   . Coronary artery disease     Past Surgical History  Procedure Laterality Date  . Coronary artery bypass graft  2001  . Lumbar  disc surgery  02/1999    Discectomy and fusion  . Vasectomy      Subsequent reversal  . Insert / replace / remove pacemaker    . Cardiac defibrillator placement    . Cholecystectomy N/A 08/30/2015    Procedure: LAPAROSCOPIC CHOLECYSTECTOMY;  Surgeon: Hubbard Robinson, MD;  Location: [name];  Service: General;  Laterality: N/A;  . Cardiac defibrillator placement      Family History  Problem Relation Age of Onset  . CAD Father   . Diabetes Father   . Heart failure Father     Social History  Substance Use Topics  . Smoking status: Former Smoker -- 1.50 packs/day for 23 years    Types: Cigarettes    Quit date: 08/27/2015  . Smokeless tobacco: Never Used  . Alcohol Use: No    Allergies  Allergen Reactions  . Codeine Nausea And Vomiting    Prior to Admission medications   Medication Sig Start Date End Date Taking? Authorizing Provider  acetaminophen (TYLENOL) 500 MG tablet Take 1,000 mg by mouth every 6 (six) hours as needed for mild pain or headache.   Yes Historical Provider, MD  albuterol (PROVENTIL HFA;VENTOLIN HFA) 108 (90 BASE) MCG/ACT inhaler Inhale 2 puffs into the lungs every 6 (six) hours as needed for wheezing or shortness of breath.   Yes Historical Provider, MD  aspirin EC 81 MG tablet Take 81 mg by mouth daily.   Yes Historical Provider, MD  atorvastatin (LIPITOR) 20 MG tablet Take 1 tablet (20 mg total) by mouth every evening. 01/18/16  Yes Lytle Butte, MD  digoxin (LANOXIN) 0.25 MG tablet Take 1 tablet (0.25 mg total) by mouth daily. 01/18/16  Yes Lytle Butte, MD  escitalopram (LEXAPRO) 20 MG tablet Take 1 tablet (20 mg total) by mouth daily. 09/02/15  Yes Max Sane, MD  furosemide (LASIX) 40 MG tablet Take 1 tablet (40 mg total) by mouth at bedtime. Patient taking differently: Take 40 mg by mouth at bedtime. 80mg  AM and 40mg  PM 01/18/16  Yes Lytle Butte, MD  insulin aspart (NOVOLOG) 100 UNIT/ML injection Inject 10-12 Units into the skin 3 (three) times  daily with meals.   Yes Historical Provider, MD  insulin glargine (LANTUS) 100 UNIT/ML injection Inject 30 Units into the skin at bedtime.   Yes Historical Provider, MD  metolazone (ZAROXOLYN) 5 MG tablet Take 5 mg by mouth daily as needed. pt. takes 1 tablet daily only if needed for 4-5 lbs weight gain, Pt. Is to take with morning lasix and 1 potassium tablet.   Yes Historical Provider, MD  oxyCODONE (OXY IR/ROXICODONE) 5 MG immediate release tablet 1-2 tabs every 6 hours for moderate pain 01/18/16  Yes Lytle Butte, MD  potassium chloride SA (K-DUR,KLOR-CON) 20 MEQ tablet Take 1 tablet (20 mEq total) by mouth 2 (two) times daily. 10/21/15  Yes Srikar Sudini, MD  zolpidem (AMBIEN) 5 MG tablet Take 1 tablet (5 mg total) by mouth at bedtime as needed for sleep. 01/18/16  Yes Lytle Butte, MD      Review of Systems  Constitutional: Positive for fatigue. Negative for appetite change and unexpected weight change.  HENT: Positive for rhinorrhea. Negative for congestion and sore throat.   Eyes: Negative.   Respiratory: Positive for chest tightness and shortness of breath. Negative for cough and wheezing.   Cardiovascular: Positive for leg swelling. Negative for chest pain and palpitations.  Gastrointestinal: Positive for abdominal pain and abdominal distention.  Endocrine: Negative.   Genitourinary: Negative.   Musculoskeletal: Positive for back pain. Negative for neck pain.  Skin: Negative.   Allergic/Immunologic: Negative.   Neurological: Negative for dizziness, light-headedness and headaches.  Hematological: Negative for adenopathy. Does not bruise/bleed easily.  Psychiatric/Behavioral: Positive for sleep disturbance (sleeping on 2-3 pillows). Negative for dysphoric mood. The patient is nervous/anxious.        Objective:   Physical Exam  Constitutional: He is oriented to person, place, and time. He appears well-developed and well-nourished.  HENT:  Head: Normocephalic and atraumatic.   Eyes: Conjunctivae are normal. Pupils are equal, round, and reactive to light.  Neck: Normal range of motion. Neck supple.  Cardiovascular: Regular rhythm.  Tachycardia present.   Pulmonary/Chest: Effort normal. He has no wheezes. He has no rales.  Abdominal: He exhibits distension. There is no tenderness.  Musculoskeletal: He exhibits edema (1+ pitting edema in bilateral lower legs). He exhibits no tenderness.  Neurological: He is alert and oriented to person, place, and time.  Skin: Skin is warm and dry.  Psychiatric: He has a normal mood and affect. His behavior is normal. Thought content normal.  Nursing note and vitals reviewed.  BP 94/65 mmHg  Pulse 88  Resp 20  Ht 5\' 9"  (1.753 m)  Wt 242 lb (109.77 kg)  BMI 35.72 kg/m2  SpO2 96%        Assessment & Plan:  1: Chronic heart failure with reduced ejection fraction- Patient presents after another hospitalization with continued fatigue and shortness of breath upon exertion. He says that he was tired and short of breath upon walking into the office but once he sits down for a few minutes, his breathing improves. He feels  like his abdomen is getting tight again and is concerned that when he comes into the hospital, he gets tapped and his swelling goes down but then tends to reoccur. He also has some edema in his lower legs as well. He does have metolazone at home that he can take but he tries not to take it much. When he does, he does know that he needs to take potassium with it. He continues to weigh himself and says that his weight is gradually increasing. By our scale, he's gained 2 pounds since his last visit here on 11/06/15. Reminded to call for an overnight weight gain of >2 pounds or a weekly weight gain of >5 pounds. He is not adding any salt to his food. Is currently not on an beta blocker nor ACE-i/ARB because he's been hypotensive. Had a sleep study done several years ago per his report. Discussed referring patient to the [facility] for evaluation and further treatment and patient expresses interest in going. Called Carole Binning (nurse navigator) and she says that they will call him to schedule an initial appointment.  2: HTN- Blood pressure on the low side so not sure how much more medication we can add. 3: Diabetes- He says that his glucose was 134 this morning. Following with his PCP regarding this. 4: Insomnia- He says that he's having trouble sleeping at night and is sleeping on 2-3 pillows. He takes zolpidem at night and he was encouraged to speak to his PCP about this.   Medication list was reviewed with patient although he says that he's really not sure if this is how he's taking some of these medications. He says that some medications are generic or different doses so he's not sure if what's printed on the list is actually how he's taking them. Discussed the importance of bringing the bottles to every visit to verify dosages.  Return here will be pending what happens at the [facility].

## 2016-02-04 ENCOUNTER — Telehealth (HOSPITAL_COMMUNITY): Payer: Self-pay | Admitting: Surgery

## 2016-02-04 DIAGNOSIS — I5022 Chronic systolic (congestive) heart failure: Secondary | ICD-10-CM | POA: Insufficient documentation

## 2016-02-04 DIAGNOSIS — G4700 Insomnia, unspecified: Secondary | ICD-10-CM | POA: Insufficient documentation

## 2016-02-04 NOTE — Telephone Encounter (Signed)
I received a referral for Ronald Miller from Darylene Price NP at [facility].  I have attempted to contact Ronald Miller 2 times and reached an answering machine.  He called back and left a message for me to say that he was unable to reach his phone in time to answer.  I will attempt to call again and leave his appointment information on his answering machine.  I will ask if he has any concerns or questions to call me back.

## 2016-02-04 NOTE — Telephone Encounter (Signed)
Ronald Miller called me back and I scheduled an appointment for him with the [facility] on May 9th at 1120.  I reviewed HF recommendations for home.  He says that he weighs daily and weight stays "around the same".  He also claims that he is careful with salt /sodium and he avoids high sodium foods.  He complains of feeling bad all the time and having a "tight " abdomen.  He tells me that his medications are all "messed up" after I ask him to bring all medication bottles with him to his appointment.  I encouraged him that we would try to help.  He would also like to see the outpatient SW for assistance with low income "housing options".  I have made referral to outpatient LCSW as well.

## 2016-02-09 ENCOUNTER — Inpatient Hospital Stay (HOSPITAL_COMMUNITY)
Admission: AD | Admit: 2016-02-09 | Discharge: 2016-02-14 | DRG: 287 | Disposition: A | Discharge status: Home / Self Care | Payer: Medicare HMO | Source: Ambulatory Visit | Attending: Internal Medicine | Admitting: Internal Medicine

## 2016-02-09 ENCOUNTER — Ambulatory Visit (HOSPITAL_COMMUNITY)
Admission: RE | Admit: 2016-02-09 | Discharge: 2016-02-09 | Disposition: A | Discharge status: Home / Self Care | Payer: Medicare HMO | Source: Ambulatory Visit | Attending: Internal Medicine | Admitting: Internal Medicine

## 2016-02-09 ENCOUNTER — Telehealth (HOSPITAL_COMMUNITY): Payer: Self-pay | Admitting: Surgery

## 2016-02-09 ENCOUNTER — Inpatient Hospital Stay (HOSPITAL_COMMUNITY): Payer: Medicare HMO

## 2016-02-09 VITALS — BP 108/76 | HR 102 | Wt 248.5 lb

## 2016-02-09 DIAGNOSIS — J449 Chronic obstructive pulmonary disease, unspecified: Secondary | ICD-10-CM | POA: Diagnosis present

## 2016-02-09 DIAGNOSIS — L8990 Pressure ulcer of unspecified site, unspecified stage: Secondary | ICD-10-CM

## 2016-02-09 DIAGNOSIS — I5022 Chronic systolic (congestive) heart failure: Secondary | ICD-10-CM

## 2016-02-09 DIAGNOSIS — F329 Major depressive disorder, single episode, unspecified: Secondary | ICD-10-CM

## 2016-02-09 DIAGNOSIS — Z833 Family history of diabetes mellitus: Secondary | ICD-10-CM | POA: Insufficient documentation

## 2016-02-09 DIAGNOSIS — E876 Hypokalemia: Secondary | ICD-10-CM

## 2016-02-09 DIAGNOSIS — Z794 Long term (current) use of insulin: Secondary | ICD-10-CM

## 2016-02-09 DIAGNOSIS — Z79899 Other long term (current) drug therapy: Secondary | ICD-10-CM | POA: Insufficient documentation

## 2016-02-09 DIAGNOSIS — F32A Depression, unspecified: Secondary | ICD-10-CM

## 2016-02-09 DIAGNOSIS — I5023 Acute on chronic systolic (congestive) heart failure: Secondary | ICD-10-CM

## 2016-02-09 DIAGNOSIS — R0602 Shortness of breath: Secondary | ICD-10-CM | POA: Diagnosis present

## 2016-02-09 DIAGNOSIS — Z951 Presence of aortocoronary bypass graft: Secondary | ICD-10-CM | POA: Insufficient documentation

## 2016-02-09 DIAGNOSIS — I110 Hypertensive heart disease with heart failure: Secondary | ICD-10-CM

## 2016-02-09 DIAGNOSIS — Z8249 Family history of ischemic heart disease and other diseases of the circulatory system: Secondary | ICD-10-CM | POA: Insufficient documentation

## 2016-02-09 DIAGNOSIS — L309 Dermatitis, unspecified: Secondary | ICD-10-CM | POA: Diagnosis present

## 2016-02-09 DIAGNOSIS — R778 Other specified abnormalities of plasma proteins: Secondary | ICD-10-CM

## 2016-02-09 DIAGNOSIS — Z7982 Long term (current) use of aspirin: Secondary | ICD-10-CM

## 2016-02-09 DIAGNOSIS — I25810 Atherosclerosis of coronary artery bypass graft(s) without angina pectoris: Secondary | ICD-10-CM | POA: Diagnosis present

## 2016-02-09 DIAGNOSIS — K8010 Calculus of gallbladder with chronic cholecystitis without obstruction: Secondary | ICD-10-CM

## 2016-02-09 DIAGNOSIS — Z4502 Encounter for adjustment and management of automatic implantable cardiac defibrillator: Secondary | ICD-10-CM

## 2016-02-09 DIAGNOSIS — I509 Heart failure, unspecified: Secondary | ICD-10-CM | POA: Diagnosis not present

## 2016-02-09 DIAGNOSIS — I2510 Atherosclerotic heart disease of native coronary artery without angina pectoris: Secondary | ICD-10-CM | POA: Insufficient documentation

## 2016-02-09 DIAGNOSIS — Z885 Allergy status to narcotic agent status: Secondary | ICD-10-CM

## 2016-02-09 DIAGNOSIS — I959 Hypotension, unspecified: Secondary | ICD-10-CM | POA: Diagnosis present

## 2016-02-09 DIAGNOSIS — R7989 Other specified abnormal findings of blood chemistry: Secondary | ICD-10-CM

## 2016-02-09 DIAGNOSIS — Z87891 Personal history of nicotine dependence: Secondary | ICD-10-CM

## 2016-02-09 DIAGNOSIS — E1169 Type 2 diabetes mellitus with other specified complication: Secondary | ICD-10-CM

## 2016-02-09 DIAGNOSIS — R188 Other ascites: Secondary | ICD-10-CM | POA: Diagnosis not present

## 2016-02-09 DIAGNOSIS — K760 Fatty (change of) liver, not elsewhere classified: Secondary | ICD-10-CM

## 2016-02-09 DIAGNOSIS — I25119 Atherosclerotic heart disease of native coronary artery with unspecified angina pectoris: Secondary | ICD-10-CM

## 2016-02-09 DIAGNOSIS — E119 Type 2 diabetes mellitus without complications: Secondary | ICD-10-CM

## 2016-02-09 DIAGNOSIS — E785 Hyperlipidemia, unspecified: Secondary | ICD-10-CM

## 2016-02-09 DIAGNOSIS — G4700 Insomnia, unspecified: Secondary | ICD-10-CM

## 2016-02-09 DIAGNOSIS — E8770 Fluid overload, unspecified: Secondary | ICD-10-CM

## 2016-02-09 DIAGNOSIS — I10 Essential (primary) hypertension: Secondary | ICD-10-CM

## 2016-02-09 HISTORY — DX: Presence of automatic (implantable) cardiac defibrillator: Z95.810

## 2016-02-09 HISTORY — DX: Reserved for inherently not codable concepts without codable children: IMO0001

## 2016-02-09 LAB — TROPONIN I: Troponin I: 0.06 ng/mL — ABNORMAL HIGH (ref <0.031)

## 2016-02-09 LAB — CBC
HCT: 37.8 % — ABNORMAL LOW (ref 39.0–52.0)
Hemoglobin: 12.2 g/dL — ABNORMAL LOW (ref 13.0–17.0)
MCH: 28.6 pg (ref 26.0–34.0)
MCHC: 32.3 g/dL (ref 30.0–36.0)
MCV: 88.7 fL (ref 78.0–100.0)
PLATELETS: 161 10*3/uL (ref 150–400)
RBC: 4.26 MIL/uL (ref 4.22–5.81)
RDW: 15.8 % — AB (ref 11.5–15.5)
WBC: 7.4 10*3/uL (ref 4.0–10.5)

## 2016-02-09 LAB — COMPREHENSIVE METABOLIC PANEL
ALT: 16 U/L — AB (ref 17–63)
AST: 18 U/L (ref 15–41)
Albumin: 3.3 g/dL — ABNORMAL LOW (ref 3.5–5.0)
Alkaline Phosphatase: 104 U/L (ref 38–126)
Anion gap: 10 (ref 5–15)
BILIRUBIN TOTAL: 0.6 mg/dL (ref 0.3–1.2)
BUN: 17 mg/dL (ref 6–20)
CHLORIDE: 104 mmol/L (ref 101–111)
CO2: 24 mmol/L (ref 22–32)
CREATININE: 1.02 mg/dL (ref 0.61–1.24)
Calcium: 8.7 mg/dL — ABNORMAL LOW (ref 8.9–10.3)
Glucose, Bld: 84 mg/dL (ref 65–99)
POTASSIUM: 4.1 mmol/L (ref 3.5–5.1)
Sodium: 138 mmol/L (ref 135–145)
TOTAL PROTEIN: 6.7 g/dL (ref 6.5–8.1)

## 2016-02-09 LAB — MAGNESIUM: Magnesium: 1.7 mg/dL (ref 1.7–2.4)

## 2016-02-09 LAB — GLUCOSE, CAPILLARY: Glucose-Capillary: 183 mg/dL — ABNORMAL HIGH (ref 65–99)

## 2016-02-09 LAB — BRAIN NATRIURETIC PEPTIDE: B NATRIURETIC PEPTIDE 5: 1346.8 pg/mL — AB (ref 0.0–100.0)

## 2016-02-09 MED ORDER — METOPROLOL SUCCINATE ER 100 MG PO TB24: 100.0000 mg | ORAL_TABLET | Freq: Every day | ORAL | Status: DC

## 2016-02-09 MED ORDER — SODIUM CHLORIDE 0.9% FLUSH
3.0000 mL | Freq: Two times a day (BID) | INTRAVENOUS | Status: DC
  Administered 2016-02-09 – 2016-02-14 (×9): 3 mL via INTRAVENOUS

## 2016-02-09 MED ORDER — DIGOXIN 125 MCG PO TABS
0.2500 mg | ORAL_TABLET | Freq: Every day | ORAL | Status: DC
  Administered 2016-02-09 – 2016-02-14 (×6): 0.25 mg via ORAL
  Filled 2016-02-09 (×7): qty 2

## 2016-02-09 MED ORDER — ONDANSETRON HCL 4 MG/2ML IJ SOLN: 4.0000 mg | Freq: Four times a day (QID) | INTRAMUSCULAR | Status: DC | PRN

## 2016-02-09 MED ORDER — BACLOFEN 10 MG PO TABS: 10.0000 mg | ORAL_TABLET | Freq: Three times a day (TID) | ORAL | Status: DC | PRN

## 2016-02-09 MED ORDER — INSULIN GLARGINE 100 UNIT/ML ~~LOC~~ SOLN
30.0000 [IU] | Freq: Every day | SUBCUTANEOUS | Status: DC
  Administered 2016-02-10 – 2016-02-13 (×3): 30 [IU] via SUBCUTANEOUS
  Filled 2016-02-09 (×5): qty 0.3

## 2016-02-09 MED ORDER — ATORVASTATIN CALCIUM 20 MG PO TABS
20.0000 mg | ORAL_TABLET | Freq: Every evening | ORAL | Status: DC
  Administered 2016-02-09 – 2016-02-13 (×4): 20 mg via ORAL
  Filled 2016-02-09 (×4): qty 1

## 2016-02-09 MED ORDER — SODIUM CHLORIDE 0.9 % IV SOLN: 250.0000 mL | INTRAVENOUS | Status: DC | PRN

## 2016-02-09 MED ORDER — ZOLPIDEM TARTRATE 5 MG PO TABS
5.0000 mg | ORAL_TABLET | Freq: Every evening | ORAL | Status: DC | PRN
  Administered 2016-02-09 – 2016-02-12 (×4): 5 mg via ORAL
  Filled 2016-02-09 (×4): qty 1

## 2016-02-09 MED ORDER — LOSARTAN POTASSIUM 25 MG PO TABS
25.0000 mg | ORAL_TABLET | Freq: Every day | ORAL | Status: DC
  Administered 2016-02-09 – 2016-02-14 (×6): 25 mg via ORAL
  Filled 2016-02-09 (×6): qty 1

## 2016-02-09 MED ORDER — ESCITALOPRAM OXALATE 10 MG PO TABS
20.0000 mg | ORAL_TABLET | Freq: Every day | ORAL | Status: DC
  Administered 2016-02-09 – 2016-02-14 (×6): 20 mg via ORAL
  Filled 2016-02-09 (×8): qty 2

## 2016-02-09 MED ORDER — SODIUM CHLORIDE 0.9% FLUSH: 3.0000 mL | INTRAVENOUS | Status: DC | PRN

## 2016-02-09 MED ORDER — INSULIN ASPART 100 UNIT/ML ~~LOC~~ SOLN
0.0000 [IU] | Freq: Three times a day (TID) | SUBCUTANEOUS | Status: DC
  Administered 2016-02-10 – 2016-02-11 (×5): 3 [IU] via SUBCUTANEOUS
  Administered 2016-02-12: 2 [IU] via SUBCUTANEOUS
  Administered 2016-02-13 (×2): 3 [IU] via SUBCUTANEOUS
  Administered 2016-02-13 – 2016-02-14 (×2): 2 [IU] via SUBCUTANEOUS

## 2016-02-09 MED ORDER — ALBUTEROL SULFATE (2.5 MG/3ML) 0.083% IN NEBU: 2.5000 mg | INHALATION_SOLUTION | Freq: Four times a day (QID) | RESPIRATORY_TRACT | Status: DC | PRN

## 2016-02-09 MED ORDER — TRAMADOL HCL 50 MG PO TABS
50.0000 mg | ORAL_TABLET | Freq: Four times a day (QID) | ORAL | Status: AC | PRN
  Administered 2016-02-09 – 2016-02-11 (×4): 50 mg via ORAL
  Filled 2016-02-09 (×4): qty 1

## 2016-02-09 MED ORDER — ENOXAPARIN SODIUM 40 MG/0.4ML ~~LOC~~ SOLN
40.0000 mg | SUBCUTANEOUS | Status: DC
Start: 2016-02-09 — End: 2016-02-12
  Administered 2016-02-09 – 2016-02-11 (×3): 40 mg via SUBCUTANEOUS
  Filled 2016-02-09 (×3): qty 0.4

## 2016-02-09 MED ORDER — ACETAMINOPHEN 500 MG PO TABS
500.0000 mg | ORAL_TABLET | Freq: Four times a day (QID) | ORAL | Status: DC | PRN
  Administered 2016-02-09 – 2016-02-11 (×2): 500 mg via ORAL
  Filled 2016-02-09 (×2): qty 1

## 2016-02-09 MED ORDER — POTASSIUM CHLORIDE CRYS ER 20 MEQ PO TBCR
20.0000 meq | EXTENDED_RELEASE_TABLET | Freq: Two times a day (BID) | ORAL | Status: DC
Start: 2016-02-09 — End: 2016-02-10
  Administered 2016-02-09: 20 meq via ORAL
  Filled 2016-02-09 (×2): qty 1

## 2016-02-09 MED ORDER — FUROSEMIDE 10 MG/ML IJ SOLN
80.0000 mg | Freq: Two times a day (BID) | INTRAMUSCULAR | Status: DC
  Administered 2016-02-09 – 2016-02-14 (×9): 80 mg via INTRAVENOUS
  Filled 2016-02-09 (×9): qty 8

## 2016-02-09 MED ORDER — ASPIRIN EC 81 MG PO TBEC
81.0000 mg | DELAYED_RELEASE_TABLET | Freq: Every day | ORAL | Status: DC
  Administered 2016-02-09 – 2016-02-14 (×6): 81 mg via ORAL
  Filled 2016-02-09 (×6): qty 1

## 2016-02-09 NOTE — Telephone Encounter (Signed)
Patient was able to be worked in to clinic appt day at 3:30 pm with Dr. Haroldine Laws.  I called patient back and he is aware and agreeable.

## 2016-02-09 NOTE — Progress Notes (Signed)
Patient ID: Ronald Miller., male   DOB: 11-03-1955, 60 y.o.   MRN: [id number]    Advanced Heart Failure Clinic Note   Referring Physician: Darylene Price, FNP Primary Care: [facility] Primary Cardiologist:   HPI:   Alpha Geers. is a 60 y.o. male with history of chronic systolic HF s/p Medtronic ICD 2014, CAD s/p CABG in 2000, HTN, Hx of cocaine abuse, Tobacco abuse, Depression, and COPD.   Echo 10/14/15 LVEF 20-25%, RV mildly dilated.   Admitted to [facility] from 4/24 - [id number] with A/C systolic HF.  Had a US guided Paracentesis with 5 L removed. They decreased his home lasix to 40 mg daily due to low blood pressure. BB held as well with bradycardia and hypotension.  Discharge weight 239 lbs  He presents today to establish with HF clinic. States he had 8-9 lbs from yesterday morning ( 241 -> 250 at home). Has abdominal distention and fluid blisters on R leg with edema up into thighs. Is having some tightness in the right side of his chest and DOE. SOB with minimal activity including changing clothes and bathing.  Didn't take any of his medicine this morning, states he fell asleep. Otherwise he states he takes his medicines every day. Took a 5 mg metolazone several days ago, thinks it seemed to help. Eats several cups of ice + water. No added salt, no specific goal. Occasional lightheadedness with rapid standing. Not marked or limiting. Not smoking. Denies ETOH. States the last time he did cocaine was 1-2 years ago.  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  + Orthopnea.  No dyspnea at rest.   Lives with his sister.   Review of Systems: [y] = yes, [ ]  = no   General: Weight gain [y]; Weight loss [ ] ; Anorexia [ ] ; Fatigue [ ] ; Fever [ ] ; Chills [ ] ; Weakness [ ]   Cardiac: Chest pain/pressure [y]; Resting SOB [ ] ; Exertional SOB [y]; Orthopnea [y]; Pedal Edema [y]; Palpitations [ ] ; Syncope [ ] ; Presyncope [ ] ; Paroxysmal nocturnal dyspnea[ ]   Pulmonary: Cough [  ]; Wheezing[ ] ; Hemoptysis[ ] ; Sputum [ ] ; Snoring [ ]   GI: Vomiting[ ] ; Dysphagia[ ] ; Melena[ ] ; Hematochezia [ ] ; Heartburn[ ] ; Abdominal pain [ ] ; Constipation [ ] ; Diarrhea [ ] ; BRBPR [ ]   GU: Hematuria[ ] ; Dysuria [ ] ; Nocturia[ ]   Vascular: Pain in legs with walking [ ] ; Pain in feet with lying flat [ ] ; Non-healing sores [ ] ; Stroke [ ] ; TIA [ ] ; Slurred speech [ ] ;  Neuro: Headaches[ ] ; Vertigo[ ] ; Seizures[ ] ; Paresthesias[ ] ;Blurred vision [ ] ; Diplopia [ ] ; Vision changes [ ]   Ortho/Skin: Arthritis [ ] ; Joint pain [ ] ; Muscle pain [ ] ; Joint swelling [ ] ; Back Pain [ ] ; Rash [ ]   Psych: Depression[ ] ; Anxiety[ ]   Heme: Bleeding problems [ ] ; Clotting disorders [ ] ; Anemia [ ]   Endocrine: Diabetes [ ] ; Thyroid dysfunction[ ]    Past Medical History  Diagnosis Date  . ASCVD (arteriosclerotic cardiovascular disease)   . Diabetes mellitus   . Hypertension   . History of cocaine abuse   . Tobacco abuse   . Depression   . Suicidal ideation   . Chronic systolic CHF (congestive heart failure) ([place])   . COPD (chronic obstructive pulmonary disease) ([place])   . Coronary artery disease     Current Outpatient Prescriptions  Medication Sig Dispense Refill  . acetaminophen (  TYLENOL) 500 MG tablet Take 1,000 mg by mouth every 6 (six) hours as needed for mild pain or headache.    . albuterol (PROVENTIL HFA;VENTOLIN HFA) 108 (90 BASE) MCG/ACT inhaler Inhale 2 puffs into the lungs every 6 (six) hours as needed for wheezing or shortness of breath.    Marland Kitchen aspirin EC 81 MG tablet Take 81 mg by mouth daily.    Marland Kitchen atorvastatin (LIPITOR) 20 MG tablet Take 1 tablet (20 mg total) by mouth every evening. 30 tablet 0  . baclofen (LIORESAL) 10 MG tablet Take 10 mg by mouth 3 (three) times daily as needed for muscle spasms.    . benazepril (LOTENSIN) 5 MG tablet Take 5 mg by mouth daily.    . digoxin (LANOXIN) 0.25 MG tablet Take 1 tablet (0.25 mg total) by mouth daily. 30 tablet 2  . escitalopram (LEXAPRO)  20 MG tablet Take 1 tablet (20 mg total) by mouth daily. 30 tablet 0  . furosemide (LASIX) 40 MG tablet Take 2 tabs in AM and 1 tab in PM    . insulin aspart (NOVOLOG) 100 UNIT/ML injection Inject 10-12 Units into the skin 3 (three) times daily with meals.    . insulin glargine (LANTUS) 100 UNIT/ML injection Inject 30 Units into the skin at bedtime.    . metolazone (ZAROXOLYN) 5 MG tablet Take 5 mg by mouth daily as needed. pt. takes 1 tablet daily only if needed for 4-5 lbs weight gain, Pt. Is to take with morning lasix and 1 potassium tablet.    . metoprolol succinate (TOPROL-XL) 100 MG 24 hr tablet Take 100 mg by mouth daily. Take with or immediately following a meal.    . oxyCODONE (OXY IR/ROXICODONE) 5 MG immediate release tablet 1-2 tabs every 6 hours for moderate pain 30 tablet 0  . potassium chloride SA (K-DUR,KLOR-CON) 20 MEQ tablet Take 1 tablet (20 mEq total) by mouth 2 (two) times daily.    Marland Kitchen zolpidem (AMBIEN) 5 MG tablet Take 1 tablet (5 mg total) by mouth at bedtime as needed for sleep. 30 tablet 0   No current facility-administered medications for this encounter.    Allergies  Allergen Reactions  . Codeine Nausea And Vomiting      Social History   Social History  . Marital Status: Single    Spouse Name: N/A  . Number of Children: N/A  . Years of Education: N/A   Occupational History  . Retired   . Truck driver    Social History Main Topics  . Smoking status: Former Smoker -- 1.50 packs/day for 23 years    Types: Cigarettes    Quit date: 08/27/2015  . Smokeless tobacco: Never Used  . Alcohol Use: No  . Drug Use: No     Comment: Former  . Sexual Activity: Not on file   Other Topics Concern  . Not on file   Social History Narrative   Divorced with one child   No regular exercise      Family History  Problem Relation Age of Onset  . CAD Father   . Diabetes Father   . Heart failure Father     Filed Vitals:   02/09/16 1517  BP: 108/76  Pulse: 102    Weight: 248 lb 8 oz (112.719 kg)  SpO2: 98%    PHYSICAL EXAM: General:  Fatigued appearing. No respiratory difficulty HEENT: normal Neck: supple. JVD difficult to assess, appears elevated to jaw at least. Carotids 2+ bilat; no bruits. No  lymphadenopathy or thyromegaly appreciated. Cor: PMI nondisplaced. RRR. No rubs, gallops or murmurs. Lungs: Diminished R>L.  Abdomen: Obese, tight, mildly tender along lower abdomen, Moderately distended. No appreciable hepatosplenomegaly. No bruits or masses. +BS Extremities: no cyanosis, clubbing. Chronic woody 2-3+ edema into thighs. Bilateral chronic venous stasis changes R>L with several bullae.  Neuro: alert & oriented x 3, cranial nerves grossly intact. moves all 4 extremities w/o difficulty. Affect pleasant.   ASSESSMENT & PLAN:  1. Acute on chronic systolic HF 2. HTN 3. DMII  He appears to be markedly volume overloaded on exam.  Will admit for IV diuresis. Will start with IV lasix 80 mg BID. Will get labs in clinic. Needs CXR and [place] for possible paracentesis.   Will need home diuretic regimen adjusted on discharge, likely will switch to torsemide.   After adequately diuresed will likely need R/LHC.  [name] Friar, PA-C

## 2016-02-09 NOTE — Addendum Note (Signed)
Encounter addended by: Effie Berkshire, RN on: 02/09/2016  4:38 PM<BR>     Documentation filed: [employer], Orders

## 2016-02-09 NOTE — Telephone Encounter (Signed)
I received a call from Ronald Miller regarding his symptoms..  He tells me that he feels "tight", is coughing and his weight is up to 250 lbs  from 242 lbs.  He would like to move his appt from tomorrow to today.  I will check schedule and attempt to work him in today.

## 2016-02-09 NOTE — H&P (Signed)
Patient ID: Ronald Askin., male DOB: Apr 10, 1956, 60 y.o. MRN: [id number]    Advanced Heart Failure H&P Note   Referring Physician: Darylene Price, FNP Primary Care: [facility] Primary Cardiologist:   HPI:   Ronald Peper. is a 60 y.o. male with history of chronic systolic HF s/p Medtronic ICD 2014, CAD s/p CABG in 2000, HTN, Hx of cocaine abuse, Tobacco abuse, Depression, and COPD.   Echo 10/14/15 LVEF 20-25%, RV mildly dilated.   Admitted to [facility] from 4/24 - [id number] with A/C systolic HF. Had a US guided Paracentesis with 5 L removed. They decreased his home lasix to 40 mg daily due to low blood pressure. BB held as well with bradycardia and hypotension. Discharge weight 239 lbs  He presents today to establish with HF clinic. States he had 8-9 lbs from yesterday morning ( 241 -> 250 at home). Has abdominal distention and fluid blisters on R leg with edema up into thighs. Is having some tightness in the right side of his chest and DOE. SOB with minimal activity including changing clothes and bathing. Didn't take any of his medicine this morning, states he fell asleep. Otherwise he states he takes his medicines every day. Took a 5 mg metolazone several days ago, thinks it seemed to help. Eats several cups of ice + water. No added salt, no specific goal. Occasional lightheadedness with rapid standing. Not marked or limiting. Not smoking. Denies ETOH. States the last time he did cocaine was 1-2 years ago. 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 + Orthopnea. No dyspnea at rest.   Lives with his sister.   Review of Systems: [y] = yes, [ ]  = no    General: Weight gain [y]; Weight loss [ ] ; Anorexia [ ] ; Fatigue [ ] ; Fever [ ] ; Chills [ ] ; Weakness [ ]    Cardiac: Chest pain/pressure [y]; Resting SOB [ ] ; Exertional SOB [y]; Orthopnea [y]; Pedal Edema [y]; Palpitations [ ] ; Syncope [ ] ; Presyncope [ ] ; Paroxysmal nocturnal dyspnea[ ]     Pulmonary: Cough [ ] ; Wheezing[ ] ; Hemoptysis[ ] ; Sputum [ ] ; Snoring [ ]    GI: Vomiting[ ] ; Dysphagia[ ] ; Melena[ ] ; Hematochezia [ ] ; Heartburn[ ] ; Abdominal pain [ ] ; Constipation [ ] ; Diarrhea [ ] ; BRBPR [ ]    GU: Hematuria[ ] ; Dysuria [ ] ; Nocturia[ ]   Vascular: Pain in legs with walking [ ] ; Pain in feet with lying flat [ ] ; Non-healing sores [ ] ; Stroke [ ] ; TIA [ ] ; Slurred speech [ ] ;   Neuro: Headaches[ ] ; Vertigo[ ] ; Seizures[ ] ; Paresthesias[ ] ;Blurred vision [ ] ; Diplopia [ ] ; Vision changes [ ]    Ortho/Skin: Arthritis [ ] ; Joint pain [ ] ; Muscle pain [ ] ; Joint swelling [ ] ; Back Pain [ ] ; Rash [ ]    Psych: Depression[ ] ; Anxiety[ ]    Heme: Bleeding problems [ ] ; Clotting disorders [ ] ; Anemia [ ]    Endocrine: Diabetes [ ] ; Thyroid dysfunction[ ]    Past Medical History  Diagnosis Date  . ASCVD (arteriosclerotic cardiovascular disease)   . Diabetes mellitus   . Hypertension   . History of cocaine abuse   . Tobacco abuse   . Depression   . Suicidal ideation   . Chronic systolic CHF (congestive heart failure) ([place])   . COPD (chronic obstructive pulmonary disease) ([place])   . Coronary artery disease      Current Outpatient Prescriptions  Medication Sig Dispense Refill  .  acetaminophen (TYLENOL) 500 MG tablet Take 1,000 mg by mouth every 6 (six) hours as needed for mild pain or headache.    . albuterol (PROVENTIL HFA;VENTOLIN HFA) 108 (90 BASE) MCG/ACT inhaler Inhale 2 puffs into the lungs every 6 (six) hours as needed for wheezing or shortness of breath.    Marland Kitchen aspirin EC 81 MG tablet Take 81 mg by mouth daily.    Marland Kitchen atorvastatin (LIPITOR) 20 MG tablet Take 1 tablet (20 mg total) by mouth every evening. 30 tablet 0  . baclofen (LIORESAL) 10 MG tablet Take 10 mg by mouth 3 (three) times daily as needed for muscle spasms.    . benazepril (LOTENSIN) 5 MG tablet Take 5 mg by mouth daily.    . digoxin (LANOXIN) 0.25 MG tablet  Take 1 tablet (0.25 mg total) by mouth daily. 30 tablet 2  . escitalopram (LEXAPRO) 20 MG tablet Take 1 tablet (20 mg total) by mouth daily. 30 tablet 0  . furosemide (LASIX) 40 MG tablet Take 2 tabs in AM and 1 tab in PM    . insulin aspart (NOVOLOG) 100 UNIT/ML injection Inject 10-12 Units into the skin 3 (three) times daily with meals.    . insulin glargine (LANTUS) 100 UNIT/ML injection Inject 30 Units into the skin at bedtime.    . metolazone (ZAROXOLYN) 5 MG tablet Take 5 mg by mouth daily as needed. pt. takes 1 tablet daily only if needed for 4-5 lbs weight gain, Pt. Is to take with morning lasix and 1 potassium tablet.    . metoprolol succinate (TOPROL-XL) 100 MG 24 hr tablet Take 100 mg by mouth daily. Take with or immediately following a meal.    . oxyCODONE (OXY IR/ROXICODONE) 5 MG immediate release tablet 1-2 tabs every 6 hours for moderate pain 30 tablet 0  . potassium chloride SA (K-DUR,KLOR-CON) 20 MEQ tablet Take 1 tablet (20 mEq total) by mouth 2 (two) times daily.    Marland Kitchen zolpidem (AMBIEN) 5 MG tablet Take 1 tablet (5 mg total) by mouth at bedtime as needed for sleep. 30 tablet 0   No current facility-administered medications for this encounter.   Allergies  Allergen Reactions  . Codeine Nausea And Vomiting    Social History   Social History  . Marital Status: Single    Spouse Name: N/A  . Number of Children: N/A  . Years of Education: N/A   Occupational History  . Retired   . Truck driver    Social History Main Topics  . Smoking status: Former Smoker -- 1.50 packs/day for 23 years    Types: Cigarettes    Quit date: 08/27/2015  . Smokeless tobacco: Never Used  . Alcohol Use: No  . Drug Use: No     Comment: Former  . Sexual Activity: Not on file   Other Topics Concern  . Not on file   Social History Narrative   Divorced with one child   No regular exercise   Family History  Problem Relation Age of Onset   . CAD Father   . Diabetes Father   . Heart failure Father     Filed Vitals:   02/09/16 1517  BP: 108/76  Pulse: 102  Weight: 248 lb 8 oz (112.719 kg)  SpO2: 98%    PHYSICAL EXAM: General: Fatigued appearing. No respiratory difficulty HEENT: normal Neck: supple. JVD difficult to assess, appears elevated to jaw at least. Carotids 2+ bilat; no bruits. No lymphadenopathy or thyromegaly appreciated. Cor: PMI nondisplaced.  RRR. No rubs, gallops or murmurs. Lungs: Diminished R>L.  Abdomen: Obese, tight, mildly tender along lower abdomen, Moderately distended. No appreciable hepatosplenomegaly. No bruits or masses. +BS Extremities: no cyanosis, clubbing. Chronic woody 2-3+ edema into thighs. Bilateral chronic venous stasis changes R>L with several bullae.  Neuro: alert & oriented x 3, cranial nerves grossly intact. moves all 4 extremities w/o difficulty. Affect pleasant.   ASSESSMENT & PLAN:  1. Acute on chronic systolic HF 2. HTN 3. CAD s/p CABG 4. DMII 5. COPD  He appears to be markedly volume overloaded on exam. Will admit for IV diuresis. Will start with IV lasix 80 mg BID. Will get labs in clinic. CMET, Mg, BNP, and CBC.    Needs CXR and [place] for possible paracentesis.   Will need home diuretic regimen adjusted on discharge, likely will switch to torsemide.   Will switch benazepril to losartan in anticipation of switching to [facility]  After adequately diuresed will likely need R/LHC.   Ronald Como [address]" [place], Ronald Miller 02/09/2016 4:18 PM   Advanced Heart Failure Team Pager [phone] (M-F; [place])  Please contact [facility] for night-coverage after hours (4p -7a ) and weekends on [email or web address]   Patient seen and examined with Ronald Kilts, Ronald Miller. We discussed all aspects of the encounter. I agree with the assessment and plan as stated above.   60 y/o male with multiple readmissions for biventricular HF now presents with recurrent fluid overload, ascites and  occasional CP. Will admit for HF flare. Will need IV diuresis, paracentesis and R/L heat cath. Will likely need extensive education as well as switching lisinopril to Entresto and lasix to torsemide.   Rami Waddle,MD 4:54 PM

## 2016-02-10 ENCOUNTER — Ambulatory Visit: Payer: Medicare HMO | Admitting: Family

## 2016-02-10 ENCOUNTER — Inpatient Hospital Stay (HOSPITAL_COMMUNITY): Payer: Medicare HMO

## 2016-02-10 ENCOUNTER — Encounter (HOSPITAL_COMMUNITY): Payer: Medicare HMO

## 2016-02-10 DIAGNOSIS — I509 Heart failure, unspecified: Secondary | ICD-10-CM

## 2016-02-10 LAB — ECHOCARDIOGRAM COMPLETE
HEIGHTINCHES: 69 in
Weight: 3955.2 oz

## 2016-02-10 LAB — GLUCOSE, CAPILLARY
GLUCOSE-CAPILLARY: 172 mg/dL — AB (ref 65–99)
GLUCOSE-CAPILLARY: 171 mg/dL — AB (ref 65–99)
Glucose-Capillary: 169 mg/dL — ABNORMAL HIGH (ref 65–99)
Glucose-Capillary: 247 mg/dL — ABNORMAL HIGH (ref 65–99)

## 2016-02-10 LAB — BASIC METABOLIC PANEL
Anion gap: 12 (ref 5–15)
BUN: 18 mg/dL (ref 6–20)
CO2: 26 mmol/L (ref 22–32)
Calcium: 8.2 mg/dL — ABNORMAL LOW (ref 8.9–10.3)
Chloride: 98 mmol/L — ABNORMAL LOW (ref 101–111)
Creatinine, Ser: 1.03 mg/dL (ref 0.61–1.24)
Glucose, Bld: 198 mg/dL — ABNORMAL HIGH (ref 65–99)
POTASSIUM: 3.6 mmol/L (ref 3.5–5.1)
SODIUM: 136 mmol/L (ref 135–145)

## 2016-02-10 LAB — DIGOXIN LEVEL: Digoxin Level: 0.7 ng/mL — ABNORMAL LOW (ref 0.8–2.0)

## 2016-02-10 LAB — TROPONIN I
TROPONIN I: 0.05 ng/mL — AB (ref <0.031)
TROPONIN I: 0.04 ng/mL — AB (ref <0.031)

## 2016-02-10 LAB — TSH: TSH: 1.007 u[IU]/mL (ref 0.350–4.500)

## 2016-02-10 MED ORDER — PERFLUTREN LIPID MICROSPHERE
1.0000 mL | INTRAVENOUS | Status: AC | PRN
  Administered 2016-02-10: 2 mL via INTRAVENOUS
  Filled 2016-02-10: qty 10

## 2016-02-10 MED ORDER — METOLAZONE 2.5 MG PO TABS
2.5000 mg | ORAL_TABLET | Freq: Two times a day (BID) | ORAL | Status: DC
  Administered 2016-02-10 – 2016-02-13 (×6): 2.5 mg via ORAL
  Filled 2016-02-10 (×6): qty 1

## 2016-02-10 MED ORDER — MAGNESIUM SULFATE 2 GM/50ML IV SOLN
2.0000 g | Freq: Once | INTRAVENOUS | Status: AC
  Administered 2016-02-10: 2 g via INTRAVENOUS
  Filled 2016-02-10: qty 50

## 2016-02-10 MED ORDER — PERFLUTREN LIPID MICROSPHERE
INTRAVENOUS | Status: AC
  Filled 2016-02-10: qty 10

## 2016-02-10 MED ORDER — POTASSIUM CHLORIDE CRYS ER 20 MEQ PO TBCR
40.0000 meq | EXTENDED_RELEASE_TABLET | Freq: Two times a day (BID) | ORAL | Status: DC
  Administered 2016-02-10 – 2016-02-14 (×9): 40 meq via ORAL
  Filled 2016-02-10 (×9): qty 2

## 2016-02-10 MED ORDER — LIDOCAINE HCL (PF) 1 % IJ SOLN
INTRAMUSCULAR | Status: AC
  Filled 2016-02-10: qty 10

## 2016-02-10 NOTE — Procedures (Signed)
Ultrasound-guided  therapeutic paracentesis performed yielding 5.2 liters of yellow fluid. No immediate complications.  

## 2016-02-10 NOTE — Progress Notes (Signed)
Advanced Heart Failure Rounding Note  Referring Physician: Darylene Price, FNP Primary Care: [facility] Primary HF:  New (Dr. Haroldine Laws)  Subjective:    No SOB at rest. Has not been out of bed. No chest pain. Edema about the same.   Despite being counseled on fluid restriction, pt has full cup of water and ice x 1 on bedside table, and sprite x 2 and 12oz cup x 4 in his bedside trash.     Feels like he peed a lot, but weight actually up 2 lbs.  Objective:   Weight Range: 247 lb 3.2 oz (112.129 kg) Body mass index is 36.49 kg/(m^2).   Vital Signs:   Temp:  [98.2 F (36.8 C)] 98.2 F (36.8 C) (05/09 0707) Pulse Rate:  [52-91] 52 (05/09 0707) Resp:  [18-20] 20 (05/09 0707) BP: (95-105)/(66-78) 95/66 mmHg (05/09 0707) SpO2:  [94 %-99 %] 97 % (05/09 0707) Weight:  [245 lb (111.131 kg)-247 lb 3.2 oz (112.129 kg)] 247 lb 3.2 oz (112.129 kg) (05/09 0707) Last BM Date: 02/09/16  Weight change: Filed Weights   02/09/16 1717 02/10/16 0707  Weight: 245 lb (111.131 kg) 247 lb 3.2 oz (112.129 kg)    Intake/Output:   Intake/Output Summary (Last 24 hours) at 02/10/16 1124 Last data filed at 02/10/16 0931  Gross per 24 hour  Intake    820 ml  Output   1225 ml  Net   -405 ml     Physical Exam: General: Fatigued appearing. NAD HEENT: normal Neck: supple. JVD difficult to assess, appears elevated to jaw. Carotids 2+ bilat; no bruits. No lymphadenopathy or thyromegaly appreciated. Cor: PMI nondisplaced. RRR. No rubs, gallops or murmurs. Lungs: Diminished R>L.  Abdomen: Obese, tight, mildly tender, Moderately distended. No appreciable hepatosplenomegaly. No bruits or masses. +BS Extremities: no cyanosis, clubbing. Chronic woody 2-3+ edema into thighs. Bilateral chronic venous stasis changes R>L with several bullae.  Neuro: alert & oriented x 3, cranial nerves grossly intact. moves all 4 extremities w/o difficulty. Affect pleasant.  Telemetry:   NSR  Labs: CBC  Recent Labs  02/09/16 1630  WBC 7.4  HGB 12.2*  HCT 37.8*  MCV 88.7  PLT Q000111Q   Basic Metabolic Panel  Recent Labs  02/09/16 1630 02/10/16 0254  NA 138 136  K 4.1 3.6  CL 104 98*  CO2 24 26  GLUCOSE 84 198*  BUN 17 18  CREATININE 1.02 1.03  CALCIUM 8.7* 8.2*  MG 1.7  --    Liver Function Tests  Recent Labs  02/09/16 1630  AST 18  ALT 16*  ALKPHOS 104  BILITOT 0.6  PROT 6.7  ALBUMIN 3.3*   No results for input(s): LIPASE, AMYLASE in the last 72 hours. Cardiac Enzymes  Recent Labs  02/09/16 2134 02/10/16 0254 02/10/16 0926  TROPONINI 0.06* 0.05* 0.04*    BNP: BNP (last 3 results)  Recent Labs  01/12/16 1922 01/26/16 0911 02/09/16 1515  BNP 2677.0* 1578.0* 1346.8*    ProBNP (last 3 results) No results for input(s): PROBNP in the last 8760 hours.   D-Dimer No results for input(s): DDIMER in the last 72 hours. Hemoglobin A1C No results for input(s): HGBA1C in the last 72 hours. Fasting Lipid Panel No results for input(s): CHOL, HDL, LDLCALC, TRIG, CHOLHDL, LDLDIRECT in the last 72 hours. Thyroid Function Tests  Recent Labs  02/10/16 0254  TSH 1.007    Other results:     Imaging/Studies:  Dg Chest 2 View  02/09/2016  CLINICAL DATA:  Shortness of breath for 5 days. History of congestive heart failure. EXAM: CHEST  2 VIEW COMPARISON:  01/26/2016 and 01/12/2016. FINDINGS: PA view was repeated. There is stable cardiomegaly status post median sternotomy and CABG. Left subclavian AICD leads appear unchanged. There is stable vascular congestion without overt pulmonary edema, confluent airspace opacity or significant pleural effusion. The bones appear unchanged. IMPRESSION: Stable postoperative chest with cardiomegaly and chronic vascular congestion. No acute cardiopulmonary process. Electronically Signed   By: Richardean Sale M.D.   On: 02/09/2016 19:42     Latest Echo  Latest Cath   Medications:     Scheduled  Medications: . aspirin EC  81 mg Oral Daily  . atorvastatin  20 mg Oral QPM  . digoxin  0.25 mg Oral Daily  . enoxaparin (LOVENOX) injection  40 mg Subcutaneous Q24H  . escitalopram  20 mg Oral Daily  . furosemide  80 mg Intravenous BID  . insulin aspart  0-15 Units Subcutaneous TID WC  . insulin glargine  30 Units Subcutaneous QHS  . losartan  25 mg Oral Daily  . metolazone  2.5 mg Oral BID  . potassium chloride SA  40 mEq Oral BID  . sodium chloride flush  3 mL Intravenous Q12H     Infusions:     PRN Medications:  sodium chloride, acetaminophen, albuterol, ondansetron (ZOFRAN) IV, sodium chloride flush, traMADol, zolpidem   Assessment   1. Acute on chronic systolic HF 2. HTN 3. CAD s/p CABG 4. DMII 5. COPD  Plan    Continue lasix IV 80 mg BID. Add metolazone 2.5 mg BID.  Counseled extensively on fluid restriction and 1500 mL restriction added.  Will have HF Nurse Navigator counsel as well.   BP too soft to switch to [facility] for now.  Will plan R/L cath toward end of week when better diuresed.  [place] Parecentesis and repeat Echo pending.   Length of Stay: 1   [name] Friar PA-C 02/10/2016, 11:24 AM  Advanced Heart Failure Team Pager [phone] (M-F; 7a - 4p)  Please contact [facility] for night-coverage after hours (4p -7a ) and weekends on [email or web address]    Patient seen and examined with Oda Kilts, PA-C. We discussed all aspects of the encounter. I agree with the assessment and plan as stated above.   Diuresing well but weight up. In garbage can there were several soda cans, drink cups and graham cracker packets with peanut butter just from the last few hours. Long talk about need for fluid restriction and dietary compliance. Will have HF Navigator talk to him.   Continue IV lasix. Add metolazone. Plan paracentesis today. Will look at u/s to assess for cirrhosis. Repeat echo today.  Mathilde Mcwherter,MD 6:41 PM

## 2016-02-10 NOTE — Progress Notes (Signed)
*  PRELIMINARY RESULTS* Echocardiogram 2D Echocardiogram with definity has been performed.  Ronald Miller 02/10/2016, 12:43 PM

## 2016-02-10 NOTE — Care Management Note (Signed)
Case Management Note  Patient Details  Name: Michail Garten. MRN: [id number] Date of Birth: 09-01-1956  Subjective/Objective:                    Action/Plan: Patient was admitted with volume overload/weight gain due to CHF. Lives at home alone. Will follow for discharge needs pending PT/OT evals and physician orders.  Expected Discharge Date:                  Expected Discharge Plan:     In-House Referral:     Discharge planning Services     Post Acute Care Choice:    Choice offered to:     DME Arranged:    DME Agency:     HH Arranged:    [place] Agency:     Status of Service:     Medicare Important Message Given:    Date Medicare IM Given:    Medicare IM give by:    Date Additional Medicare IM Given:    Additional Medicare Important Message give by:     If discussed at [place] of Stay Meetings, dates discussed:    Additional Comments:  Rolm Baptise, RN 02/10/2016, 11:49 AM

## 2016-02-10 NOTE — Care Management Note (Signed)
Case Management Note  Patient Details  Name: Ronald Miller. MRN: [id number] Date of Birth: 12-08-1955  Subjective/Objective:     Admitted with CHF              Action/Plan: CM talked to patient about DCP; he lives with his sister and brother in [place]; goes to the [facility] in [place] for primary care; has private insurance with [employer] with prescription drug coverage; pharmacy of choice [facility] and he also has mail order pharmacy with [employer] - he receives a 90 day supply of medication; CM encouraged pt to not let his medication "run out" especially with mail order prescriptions. He does not use any DME at this time, does not exercise; stated " I'm just tired and don't feel like doing anything." CM encouraged pt to talk to his physician about possibly walking a short distance. He eats a heart health diet low in sodium. Also he stated that he is replacing his digital scales to weigh himself daily. CM will continue to follow for DCP.  Expected Discharge Date:     Possibly 02/13/2016             Expected Discharge Plan:  Home/Self Care  Discharge planning Services  CM Consult   Choice offered to:  NA  Status of Service:  In process, will continue to follow  Sherrilyn Rist [id number] 02/10/2016, 12:14 PM

## 2016-02-11 ENCOUNTER — Inpatient Hospital Stay (HOSPITAL_COMMUNITY): Payer: Medicare HMO

## 2016-02-11 LAB — BASIC METABOLIC PANEL
ANION GAP: 11 (ref 5–15)
BUN: 19 mg/dL (ref 6–20)
CALCIUM: 8.4 mg/dL — AB (ref 8.9–10.3)
CO2: 28 mmol/L (ref 22–32)
Chloride: 98 mmol/L — ABNORMAL LOW (ref 101–111)
Creatinine, Ser: 0.99 mg/dL (ref 0.61–1.24)
Glucose, Bld: 185 mg/dL — ABNORMAL HIGH (ref 65–99)
POTASSIUM: 3.7 mmol/L (ref 3.5–5.1)
Sodium: 137 mmol/L (ref 135–145)

## 2016-02-11 LAB — PROTIME-INR
INR: 1.18 (ref 0.00–1.49)
PROTHROMBIN TIME: 15.1 s (ref 11.6–15.2)

## 2016-02-11 LAB — CBC
HCT: 36.8 % — ABNORMAL LOW (ref 39.0–52.0)
Hemoglobin: 11.7 g/dL — ABNORMAL LOW (ref 13.0–17.0)
MCH: 28.1 pg (ref 26.0–34.0)
MCHC: 31.8 g/dL (ref 30.0–36.0)
MCV: 88.5 fL (ref 78.0–100.0)
PLATELETS: 145 10*3/uL — AB (ref 150–400)
RBC: 4.16 MIL/uL — AB (ref 4.22–5.81)
RDW: 15.7 % — ABNORMAL HIGH (ref 11.5–15.5)
WBC: 5.7 10*3/uL (ref 4.0–10.5)

## 2016-02-11 LAB — GLUCOSE, CAPILLARY
GLUCOSE-CAPILLARY: 111 mg/dL — AB (ref 65–99)
GLUCOSE-CAPILLARY: 234 mg/dL — AB (ref 65–99)
Glucose-Capillary: 172 mg/dL — ABNORMAL HIGH (ref 65–99)
Glucose-Capillary: 196 mg/dL — ABNORMAL HIGH (ref 65–99)

## 2016-02-11 LAB — HEMOGLOBIN A1C
HEMOGLOBIN A1C: 8.4 % — AB (ref 4.8–5.6)
MEAN PLASMA GLUCOSE: 194 mg/dL

## 2016-02-11 MED ORDER — SODIUM CHLORIDE 0.9 % IV SOLN
INTRAVENOUS | Status: DC
  Administered 2016-02-12: 10:00:00 via INTRAVENOUS

## 2016-02-11 MED ORDER — SODIUM CHLORIDE 0.9 % IV SOLN: 250.0000 mL | INTRAVENOUS | Status: DC | PRN

## 2016-02-11 MED ORDER — SODIUM CHLORIDE 0.9% FLUSH: 3.0000 mL | INTRAVENOUS | Status: DC | PRN

## 2016-02-11 MED ORDER — SODIUM CHLORIDE 0.9% FLUSH
3.0000 mL | Freq: Two times a day (BID) | INTRAVENOUS | Status: DC
  Administered 2016-02-11 – 2016-02-12 (×2): 3 mL via INTRAVENOUS

## 2016-02-11 MED ORDER — SPIRONOLACTONE 25 MG PO TABS
12.5000 mg | ORAL_TABLET | Freq: Every day | ORAL | Status: DC
  Administered 2016-02-11 – 2016-02-14 (×4): 12.5 mg via ORAL
  Filled 2016-02-11 (×4): qty 1

## 2016-02-11 NOTE — Progress Notes (Addendum)
Advanced Heart Failure Rounding Note  Referring Physician: Darylene Price, FNP Primary Care: [facility] Primary HF:  New (Dr. Haroldine Laws)  Subjective:    Echo 02/10/16 LVEF 15%, Mild MR, Mod LAE, Severe RAE, severely reduced RV function, PA peak pressure 31 mm Hg.   S/p US paracentesis 02/10/16 with 5.2 L out  Feeling better. Denies SOB currently.  Peed a lot yesterday, got up in the night a few times.   Out 2.4 L and weight shows down 12 lbs.  Much improved with metolazone and fluid restriction.  Objective:   Weight Range: 235 lb 3.7 oz (106.7 kg) Body mass index is 34.72 kg/(m^2).   Vital Signs:   Temp:  [97.6 F (36.4 C)-97.9 F (36.6 C)] 97.9 F (36.6 C) (05/10 0525) Pulse Rate:  [61-81] 73 (05/10 0525) Resp:  [18-20] 18 (05/10 0525) BP: (88-119)/(52-99) 98/63 mmHg (05/10 0525) SpO2:  [96 %-99 %] 97 % (05/10 0525) Weight:  [235 lb 3.7 oz (106.7 kg)-244 lb 7.8 oz (110.9 kg)] 235 lb 3.7 oz (106.7 kg) (05/10 0807) Last BM Date: 02/09/16  Weight change: Filed Weights   02/10/16 0707 02/11/16 0525 02/11/16 0807  Weight: 247 lb 3.2 oz (112.129 kg) 244 lb 7.8 oz (110.9 kg) 235 lb 3.7 oz (106.7 kg)    Intake/Output:   Intake/Output Summary (Last 24 hours) at 02/11/16 0857 Last data filed at 02/11/16 0525  Gross per 24 hour  Intake    822 ml  Output   3450 ml  Net  -2628 ml     Physical Exam: General: Sitting in chair. NAD HEENT: normal Neck: supple. JVD difficult to assess, appears elevated to jaw. Carotids 2+ bilat; no bruits. No lymphadenopathy or thyromegaly appreciated. Cor: PMI nondisplaced. RRR. No rubs, gallops or murmurs. Lungs: Diminished R>L.  Abdomen: Obese, tight, mildly tender, mildly distended. No appreciable hepatosplenomegaly. No bruits or masses. +BS Extremities: no cyanosis, clubbing. Chronic woody 1-2+ edema into thighs. Bilateral chronic venous stasis changes R>L with several bullae.  Neuro: alert & oriented x 3, cranial  nerves grossly intact. moves all 4 extremities w/o difficulty. Affect pleasant.  Telemetry:  NSR  Labs: CBC  Recent Labs  02/09/16 1630  WBC 7.4  HGB 12.2*  HCT 37.8*  MCV 88.7  PLT [id number]   Basic Metabolic Panel  Recent Labs  02/09/16 1630 02/10/16 0254 02/11/16 0255  NA 138 136 137  K 4.1 3.6 3.7  CL 104 98* 98*  CO2 24 26 28   GLUCOSE 84 198* 185*  BUN 17 18 19   CREATININE 1.02 1.03 0.99  CALCIUM 8.7* 8.2* 8.4*  MG 1.7  --   --    Liver Function Tests  Recent Labs  02/09/16 1630  AST 18  ALT 16*  ALKPHOS 104  BILITOT 0.6  PROT 6.7  ALBUMIN 3.3*   No results for input(s): LIPASE, AMYLASE in the last 72 hours. Cardiac Enzymes  Recent Labs  02/09/16 2134 02/10/16 0254 02/10/16 0926  TROPONINI 0.06* 0.05* 0.04*    BNP: BNP (last 3 results)  Recent Labs  01/12/16 1922 01/26/16 0911 02/09/16 1515  BNP 2677.0* 1578.0* 1346.8*    ProBNP (last 3 results) No results for input(s): PROBNP in the last 8760 hours.   D-Dimer No results for input(s): DDIMER in the last 72 hours. Hemoglobin A1C  Recent Labs  02/10/16 0254  HGBA1C 8.4*   Fasting Lipid Panel No results for input(s): CHOL, HDL, LDLCALC, TRIG, CHOLHDL, LDLDIRECT in the last 72  hours. Thyroid Function Tests  Recent Labs  02/10/16 0254  TSH 1.007    Other results:  Imaging/Studies:  Dg Chest 2 View  02/09/2016  CLINICAL DATA:  Shortness of breath for 5 days. History of congestive heart failure. EXAM: CHEST  2 VIEW COMPARISON:  01/26/2016 and 01/12/2016. FINDINGS: PA view was repeated. There is stable cardiomegaly status post median sternotomy and CABG. Left subclavian AICD leads appear unchanged. There is stable vascular congestion without overt pulmonary edema, confluent airspace opacity or significant pleural effusion. The bones appear unchanged. IMPRESSION: Stable postoperative chest with cardiomegaly and chronic vascular congestion. No acute cardiopulmonary process.  Electronically Signed   By: Richardean Sale M.D.   On: 02/09/2016 19:42   US Paracentesis  02/10/2016  INDICATION: CHF,CAD, recurrent ascites. Request made for therapeutic paracentesis. EXAM: ULTRASOUND GUIDED THERAPEUTIC PARACENTESIS MEDICATIONS: None. COMPLICATIONS: None immediate. PROCEDURE: Informed written consent was obtained from the patient after a discussion of the risks, benefits and alternatives to treatment. A timeout was performed prior to the initiation of the procedure. Initial ultrasound scanning demonstrates a large amount of ascites within the left lower abdominal quadrant. The left lower abdomen was prepped and draped in the usual sterile fashion. 1% lidocaine was used for local anesthesia. Following this, a Yueh catheter was introduced. An ultrasound image was saved for documentation purposes. The paracentesis was performed. The catheter was removed and a dressing was applied. The patient tolerated the procedure well without immediate post procedural complication. FINDINGS: A total of approximately 5.2 liters of yellow fluid was removed. IMPRESSION: Successful ultrasound-guided therapeutic paracentesis yielding 5.2 liters of peritoneal fluid. Read by: Rowe Robert, PA-C Electronically Signed   By: Sandi Mariscal M.D.   On: 02/10/2016 16:15    Latest Echo  Latest Cath   Medications:     Scheduled Medications: . aspirin EC  81 mg Oral Daily  . atorvastatin  20 mg Oral QPM  . digoxin  0.25 mg Oral Daily  . enoxaparin (LOVENOX) injection  40 mg Subcutaneous Q24H  . escitalopram  20 mg Oral Daily  . furosemide  80 mg Intravenous BID  . insulin aspart  0-15 Units Subcutaneous TID WC  . insulin glargine  30 Units Subcutaneous QHS  . losartan  25 mg Oral Daily  . metolazone  2.5 mg Oral BID  . potassium chloride SA  40 mEq Oral BID  . sodium chloride flush  3 mL Intravenous Q12H    Infusions:    PRN Medications: sodium chloride, acetaminophen, albuterol, ondansetron (ZOFRAN)  IV, sodium chloride flush, traMADol, zolpidem   Assessment   1. Acute on chronic systolic HF 2. HTN 3. CAD s/p CABG 4. DMII 5. COPD 6. R leg wounds.  Plan    Echo 02/10/16 LVEF 15%, Mild MR, Mod LAE, Severe RAE, severely reduced RV function, PA peak pressure 31 mm Hg.   S/p US paracentesis 02/10/16 with 5.2 L out  Continue lasix IV 80 mg BID and metolazone 2.5 mg BID with decent diuresis.   Counseled extensively on fluid restriction and 1500 mL restriction added.  Will have HF Nurse Navigator counsel as well.   Will have WOC see for right LE blisters and wounds.  Will discuss timing of R/L cath with MD.  Possibly tomorrow.   Length of Stay: 2  [name] Friar PA-C 02/11/2016, 8:57 AM  Advanced Heart Failure Team Pager [phone] (M-F; 7a - 4p)  Please contact [facility] for night-coverage after hours (4p -7a ) and weekends on [email or web address]  Patient seen and examined with Oda Kilts, PA-C. We discussed all aspects of the encounter. I agree with the assessment and plan as stated above.   Echo images reviewed personally. Shows severe biventricular dysfunction. EF 10-15% RV bad.   Improved with paracentesis and diuresis. Continue IV lasix and metolazone. Add spiro 12.5. Continue HF education. Wound Care consult. BP too low for Entresto or b-blocker.   Previous CT of abdomen showed severe steatosis. Will check RUQ u/s to re-evaluate.   Cath probably Friday.   Preslyn Warr,MD 10:13 AM

## 2016-02-11 NOTE — Consult Note (Signed)
   [facility] CM Inpatient Consult   02/11/2016  Ronald Miller. 23-Nov-1955 [id number]  Thank you for this consult.  Patient evaluated for [place] [facility] services.  .   This patient is Not eligible for [facility] Care Management Services.   Reason:  Not a beneficiary currently attributed to one of the [place].  Membership roster was used to verify non- eligible status.  For questions, please contact:  Natividad Brood, RN BSN [facility] Liaison  [phone] business mobile phone Toll free office [phone]

## 2016-02-11 NOTE — Consult Note (Signed)
WOC wound consult note Reason for Consult: LE weeping, venous skin changes Wound type: weeping RLE Patient reports he wears compression stockings at home bilaterally [place] on the right pretibial regions, serous weeping, Venous skin changes, hemosiderin staining and venous dermatitis  Periwound: intact  Dressing procedure/placement/frequency: Add xeroform to the area that is weeping, cover with ABD pad, secure with kerlix.  Add 4" ACE wraps bilaterally from first metatarsal head to patellar notch with slight stretch.  Change daily. Patient to keep legs elevated as much as possible.  Resume use of compression stockings once at home.   Discussed POC with patient and bedside nurse.  Re consult if needed, will not follow at this time. Thanks  Kashlynn Kundert Kellogg, [place] [phone])

## 2016-02-11 NOTE — Progress Notes (Addendum)
Heart Failure Navigator Consult Note  Presentation: Ronald Miller.  is a 60 y.o. male with history of chronic systolic HF s/p Medtronic ICD 2014, CAD s/p CABG in 2000, HTN, Hx of cocaine abuse, Tobacco abuse, Depression, and COPD.   Echo 10/14/15 LVEF 20-25%, RV mildly dilated.   Admitted to [facility] from 4/24 - [id number] with A/C systolic HF. Had a US guided Paracentesis with 5 L removed. They decreased his home lasix to 40 mg daily due to low blood pressure. BB held as well with bradycardia and hypotension. Discharge weight 239 lbs  He presents today to establish with HF clinic. He was referred by Darylene Price NP at [facility].  He states he had 8-9 lbs from yesterday morning ( 241 -> 250 at home). Has abdominal distention and fluid blisters on R leg with edema up into thighs. Is having some tightness in the right side of his chest and DOE. SOB with minimal activity including changing clothes and bathing. Didn't take any of his medicine this morning, states he fell asleep. Otherwise he states he takes his medicines every day. Took a 5 mg metolazone several days ago, thinks it seemed to help. Eats several cups of ice + water. No added salt, no specific goal. Occasional lightheadedness with rapid standing. Not marked or limiting. Not smoking. Denies ETOH. States the last time he did cocaine was 1-2 years ago. 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 + Orthopnea. No dyspnea at rest.   Lives with his sister.   Past Medical History  Diagnosis Date  . ASCVD (arteriosclerotic cardiovascular disease)   . Diabetes mellitus   . Hypertension   . History of cocaine abuse   . Tobacco abuse   . Depression   . Suicidal ideation   . Chronic systolic CHF (congestive heart failure) ([place])   . COPD (chronic obstructive pulmonary disease) ([place])   . Coronary artery disease     Social History   Social History  . Marital Status: Single    Spouse Name: N/A  . Number of Children: N/A   . Years of Education: N/A   Occupational History  . Retired   . Truck driver    Social History Main Topics  . Smoking status: Former Smoker -- 1.50 packs/day for 23 years    Types: Cigarettes    Quit date: 08/27/2015  . Smokeless tobacco: Never Used  . Alcohol Use: No  . Drug Use: No     Comment: Former  . Sexual Activity: Not on file   Other Topics Concern  . Not on file   Social History Narrative   Divorced with one child   No regular exercise    ECHO:Study Conclusions--02/10/16 - Left ventricle: The cavity size was moderately dilated. There was  moderate concentric hypertrophy. Systolic function was severely  reduced. The estimated ejection fraction was 15-20%. Wall motion  was normal; there were no regional wall motion abnormalities.  Doppler parameters are consistent with restrictive physiology,  indicative of decreased left ventricular diastolic compliance  and/or increased left atrial pressure. - Ventricular septum: The contour showed diastolic flattening and  systolic flattening. - Aortic valve: Trileaflet; normal thickness leaflets. There was no  regurgitation. - Aortic root: The aortic root was normal in size. - Mitral valve: Structurally normal valve. There was mild  regurgitation. - Left atrium: The atrium was moderately dilated. - Right ventricle: The cavity size was severely dilated. Wall  thickness was normal. Systolic  function was severely reduced. - Right atrium: The atrium was severely dilated. - Tricuspid valve: There was mild regurgitation. - Pulmonary arteries: PA peak pressure: 31 mm Hg (S).  Impressions:  - Moderately dilated LV with severely decreased LVEF estimated at  15-20%, diffuse hypokinesis, no LV thrombus.  Restrictive pattern of diastolic dysfunction.  RV is severely dilated with severely decreased systolic function.  RVSP is underestimated by RV dysfunction.  Right sided chamber dilatation suggestive of  significant  pulmonary hypertension.  Transthoracic echocardiography. M-mode, complete 2D, spectral Doppler, and color Doppler. Birthdate: Patient birthdate: Mar 12, 1956. Age: Patient is 60 yr old. Sex: Gender: male. BMI: 36.6 kg/m^2. Blood pressure: 102/59 Patient status: Inpatient. Study date: Study date: 02/10/2016. Study time: 11:23 AM. Location: Bedside.  BNP    Component Value Date/Time   BNP 1346.8* 02/09/2016 1515   BNP 2558* 04/09/2014 0848    ProBNP No results found for: PROBNP   Education Assessment and Provision:  Detailed education and instructions provided on heart failure disease management including the following:  Signs and symptoms of Heart Failure When to call the physician Importance of daily weights Low sodium diet Fluid restriction Medication management Anticipated future follow-up appointments  Patient education given on each of the above topics.  Patient acknowledges understanding and acceptance of all instructions.  I spoke at length with Mr Vonbergen regarding his HF.  He says that he does not have a working scale at home however says he has been weighing some?  I will provide a scale for home use.  I explained the importance of daily weights and how weight increases relate to the signs and symptoms of HF.  We discussed his diet at home--he admits that he often eats "Geryl Councilman, Oodles of Noodles" and he loves "chicken noodle soup".  I have discouraged his use of these high sodium foods and he tells me that he had no idea that they were "bad".  I reviewed a 2000 mg Sodium restricted diet and foods that were better choices.  He says that he loves diet sodas yet feels that he does not drink 2 L per day at home.  I asked him to restrict his fluid intake and explained the need.  He denies any issues with getting or taking prescribed medications.   He lives in [place] with his sister.    Education Materials:  "Living Better With Heart  Failure" Booklet, Daily Weight Tracker Tool    High Risk Criteria for Readmission and/or Poor Patient Outcomes:  (Recommend Follow-up with Advanced Heart Failure Clinic)--yes   EF <30%- 15-20%  2 or more admissions in 6 months- Yes --[place] 6/37mo  Difficult social situation- No  Demonstrates medication noncompliance- Denies    Barriers of Care:  Knowledge, compliance and health literacy  Discharge Planning:   Plans to return to [place] with sister.  He would benefit from [facility] for ongoing education, compliance reinforcement and symptom recognition as well as [facility] referral for home based care management.  He will follow with AHF Clinic as an outpatient.

## 2016-02-11 NOTE — Evaluation (Signed)
Physical Therapy Evaluation Patient Details Name: Ronald Miller. MRN: [id number] DOB: 1955/11/13 Today's Date: 02/11/2016   History of Present Illness  Pt is a 60 y.o. male with history of chronic systolic HF s/p Medtronic ICD 2014, CAD s/p CABG in 2000, HTN, Hx of cocaine abuse, Tobacco abuse, Depression, and COPD.   Clinical Impression  Pt is independent to mod independent with all functional mobility. No PT intervention indicated. Pt encouraged to ambulate in hallway. PT signing off.    Follow Up Recommendations No PT follow up;Other (comment) (Pt may benefit from cardiac rehab.)    Equipment Recommendations  None recommended by PT    Recommendations for Other Services       Precautions / Restrictions Precautions Precautions: None      Mobility  Bed Mobility Overal bed mobility: Independent                Transfers Overall transfer level: Independent Equipment used: None             General transfer comment: Pt demo safe technique.  Ambulation/Gait Ambulation/Gait assistance: Modified independent (Device/Increase time) Ambulation Distance (Feet): 250 Feet Assistive device: None Gait Pattern/deviations: Step-through pattern;Decreased stride length;Wide base of support Gait velocity: mildly decreased   General Gait Details: mild SOB upon return to room. O2 sats 96% HR 104; Pt demo steady gait.  Stairs            Wheelchair Mobility    Modified Rankin (Stroke Patients Only)       Balance Overall balance assessment: No apparent balance deficits (not formally assessed)                                           Pertinent Vitals/Pain Pain Assessment: No/denies pain    Home Living Family/patient expects to be discharged to:: Private residence Living Arrangements: Other relatives (sister) Available Help at Discharge: Family;Available PRN/intermittently Type of Home: House       Home Layout: One level Home Equipment:  None      Prior Function Level of Independence: Independent               Hand Dominance        Extremity/Trunk Assessment   Upper Extremity Assessment: Overall WFL for tasks assessed           Lower Extremity Assessment: Overall WFL for tasks assessed      Cervical / Trunk Assessment: Normal  Communication   Communication: No difficulties  Cognition Arousal/Alertness: Awake/alert Behavior During Therapy: WFL for tasks assessed/performed Overall Cognitive Status: Within Functional Limits for tasks assessed                      General Comments      Exercises        Assessment/Plan    PT Assessment Patent does not need any further PT services  PT Diagnosis Difficulty walking   PT Problem List    PT Treatment Interventions     PT Goals (Current goals can be found in the Care Plan section) Acute Rehab PT Goals Patient Stated Goal: increase activity PT Goal Formulation: All assessment and education complete, DC therapy    Frequency     Barriers to discharge        Co-evaluation               End of  Session   Activity Tolerance: Patient tolerated treatment well Patient left: in chair;with call bell/phone within reach Nurse Communication: Mobility status         Time: [id number] PT Time Calculation (min) (ACUTE ONLY): 11 min   Charges:   PT Evaluation $PT Eval Moderate Complexity: 1 Procedure     PT G Codes:        Lorriane Shire 02/11/2016, 10:08 AM

## 2016-02-11 NOTE — Progress Notes (Signed)
Pt NPO for US abdomen.

## 2016-02-11 NOTE — Progress Notes (Signed)
CARDIAC REHAB PHASE I   PRE:  Rate/Rhythm: 84 SR  BP:  Sitting: 105/69        SaO2: 97 RA  MODE:  Ambulation: 460 ft   POST:  Rate/Rhythm: 102 paced  BP:  Sitting: 105/68         SaO2: 96 RA  Pt upset that he is NPO, agreeable to walk. Pt ambulated 460 ft on RA, independent, steady gait, tolerated well. Pt did c/o some mild DOE, denies any other symptoms, declined rest stop. Pt states he has "not walked that far in a long time." Pt to recliner after walk, call bell within reach. Will follow.   [id number] Lenna Sciara, RN, BSN 02/11/2016 2:27 PM

## 2016-02-12 ENCOUNTER — Encounter (HOSPITAL_COMMUNITY)
Admission: AD | Disposition: A | Discharge status: Home / Self Care | Payer: Self-pay | Source: Ambulatory Visit | Attending: Internal Medicine

## 2016-02-12 HISTORY — PX: CARDIAC CATHETERIZATION: SHX172

## 2016-02-12 LAB — CBC
HCT: 37.7 % — ABNORMAL LOW (ref 39.0–52.0)
Hemoglobin: 12.3 g/dL — ABNORMAL LOW (ref 13.0–17.0)
MCH: 29.4 pg (ref 26.0–34.0)
MCHC: 32.6 g/dL (ref 30.0–36.0)
MCV: 90.2 fL (ref 78.0–100.0)
PLATELETS: 166 10*3/uL (ref 150–400)
RBC: 4.18 MIL/uL — ABNORMAL LOW (ref 4.22–5.81)
RDW: 15.8 % — AB (ref 11.5–15.5)
WBC: 5.5 10*3/uL (ref 4.0–10.5)

## 2016-02-12 LAB — CREATININE, SERUM
CREATININE: 1.03 mg/dL (ref 0.61–1.24)
GFR calc Af Amer: >60 mL/min (ref >60)

## 2016-02-12 LAB — POCT I-STAT 3, VENOUS BLOOD GAS (G3P V)
ACID-BASE EXCESS: 10 mmol/L — AB (ref 0.0–2.0)
ACID-BASE EXCESS: 6 mmol/L — AB (ref 0.0–2.0)
BICARBONATE: 31.7 meq/L — AB (ref 20.0–24.0)
Bicarbonate: 36.4 mEq/L — ABNORMAL HIGH (ref 20.0–24.0)
O2 SAT: 63 %
O2 Saturation: 62 %
PH VEN: 7.398 — AB (ref 7.250–7.300)
PO2 VEN: 33 mmHg (ref 31.0–45.0)
TCO2: 33 mmol/L (ref 0–100)
TCO2: 38 mmol/L (ref 0–100)
pCO2, Ven: 51.3 mmHg — ABNORMAL HIGH (ref 45.0–50.0)
pCO2, Ven: 55.3 mmHg — ABNORMAL HIGH (ref 45.0–50.0)
pH, Ven: 7.425 — ABNORMAL HIGH (ref 7.250–7.300)
pO2, Ven: 33 mmHg (ref 31.0–45.0)

## 2016-02-12 LAB — BASIC METABOLIC PANEL
Anion gap: 10 (ref 5–15)
BUN: 17 mg/dL (ref 6–20)
CHLORIDE: 96 mmol/L — AB (ref 101–111)
CO2: 30 mmol/L (ref 22–32)
CREATININE: 1.04 mg/dL (ref 0.61–1.24)
Calcium: 8.5 mg/dL — ABNORMAL LOW (ref 8.9–10.3)
Glucose, Bld: 152 mg/dL — ABNORMAL HIGH (ref 65–99)
POTASSIUM: 3.9 mmol/L (ref 3.5–5.1)
SODIUM: 136 mmol/L (ref 135–145)

## 2016-02-12 LAB — POCT I-STAT 3, ART BLOOD GAS (G3+)
Acid-Base Excess: 8 mmol/L — ABNORMAL HIGH (ref 0.0–2.0)
BICARBONATE: 33.3 meq/L — AB (ref 20.0–24.0)
O2 Saturation: 98 %
TCO2: 35 mmol/L (ref 0–100)
pCO2 arterial: 47.3 mmHg — ABNORMAL HIGH (ref 35.0–45.0)
pH, Arterial: 7.456 — ABNORMAL HIGH (ref 7.350–7.450)
pO2, Arterial: 97 mmHg (ref 80.0–100.0)

## 2016-02-12 LAB — GLUCOSE, CAPILLARY
GLUCOSE-CAPILLARY: 81 mg/dL (ref 65–99)
Glucose-Capillary: 130 mg/dL — ABNORMAL HIGH (ref 65–99)
Glucose-Capillary: 121 mg/dL — ABNORMAL HIGH (ref 65–99)
Glucose-Capillary: 94 mg/dL (ref 65–99)

## 2016-02-12 LAB — MAGNESIUM: MAGNESIUM: 1.6 mg/dL — AB (ref 1.7–2.4)

## 2016-02-12 SURGERY — RIGHT/LEFT HEART CATH AND CORONARY/GRAFT ANGIOGRAPHY

## 2016-02-12 MED ORDER — HEPARIN (PORCINE) IN NACL 2-0.9 UNIT/ML-% IJ SOLN
INTRAMUSCULAR | Status: DC | PRN
  Administered 2016-02-12: 18:00:00

## 2016-02-12 MED ORDER — SODIUM CHLORIDE 0.9 % IV SOLN: INTRAVENOUS | Status: AC

## 2016-02-12 MED ORDER — LIDOCAINE HCL (PF) 1 % IJ SOLN
INTRAMUSCULAR | Status: AC
  Filled 2016-02-12: qty 30

## 2016-02-12 MED ORDER — LIDOCAINE HCL (PF) 1 % IJ SOLN
INTRAMUSCULAR | Status: DC | PRN
  Administered 2016-02-12: 25 mL

## 2016-02-12 MED ORDER — SODIUM CHLORIDE 0.9 % IV SOLN: 250.0000 mL | INTRAVENOUS | Status: DC | PRN

## 2016-02-12 MED ORDER — FUROSEMIDE 10 MG/ML IJ SOLN: 80.0000 mg | Freq: Two times a day (BID) | INTRAMUSCULAR | Status: DC

## 2016-02-12 MED ORDER — ENOXAPARIN SODIUM 40 MG/0.4ML ~~LOC~~ SOLN
40.0000 mg | SUBCUTANEOUS | Status: DC
  Administered 2016-02-13 – 2016-02-14 (×2): 40 mg via SUBCUTANEOUS
  Filled 2016-02-12 (×2): qty 0.4

## 2016-02-12 MED ORDER — IOPAMIDOL (ISOVUE-370) INJECTION 76%
INTRAVENOUS | Status: AC
  Filled 2016-02-12: qty 100

## 2016-02-12 MED ORDER — FENTANYL CITRATE (PF) 100 MCG/2ML IJ SOLN
INTRAMUSCULAR | Status: AC
  Filled 2016-02-12: qty 2

## 2016-02-12 MED ORDER — SODIUM CHLORIDE 0.9% FLUSH: 3.0000 mL | INTRAVENOUS | Status: DC | PRN

## 2016-02-12 MED ORDER — SODIUM CHLORIDE 0.9% FLUSH
3.0000 mL | Freq: Two times a day (BID) | INTRAVENOUS | Status: DC
  Administered 2016-02-13 – 2016-02-14 (×2): 3 mL via INTRAVENOUS

## 2016-02-12 MED ORDER — ACETAMINOPHEN 325 MG PO TABS: 650.0000 mg | ORAL_TABLET | ORAL | Status: DC | PRN

## 2016-02-12 MED ORDER — IOPAMIDOL (ISOVUE-370) INJECTION 76%
INTRAVENOUS | Status: DC | PRN
  Administered 2016-02-12: 73 mL

## 2016-02-12 MED ORDER — OXYCODONE HCL 5 MG PO TABS
5.0000 mg | ORAL_TABLET | ORAL | Status: DC | PRN
  Administered 2016-02-12 – 2016-02-13 (×2): 5 mg via ORAL
  Filled 2016-02-12 (×2): qty 1

## 2016-02-12 MED ORDER — FENTANYL CITRATE (PF) 100 MCG/2ML IJ SOLN
INTRAMUSCULAR | Status: DC | PRN
  Administered 2016-02-12: 25 ug via INTRAVENOUS

## 2016-02-12 MED ORDER — ONDANSETRON HCL 4 MG/2ML IJ SOLN: 4.0000 mg | Freq: Four times a day (QID) | INTRAMUSCULAR | Status: DC | PRN

## 2016-02-12 MED ORDER — MIDAZOLAM HCL 2 MG/2ML IJ SOLN
INTRAMUSCULAR | Status: DC | PRN
  Administered 2016-02-12: 1 mg via INTRAVENOUS

## 2016-02-12 MED ORDER — HEPARIN (PORCINE) IN NACL 2-0.9 UNIT/ML-% IJ SOLN
INTRAMUSCULAR | Status: AC
  Filled 2016-02-12: qty 1000

## 2016-02-12 MED ORDER — MIDAZOLAM HCL 2 MG/2ML IJ SOLN
INTRAMUSCULAR | Status: AC
  Filled 2016-02-12: qty 2

## 2016-02-12 SURGICAL SUPPLY — 14 items
CATH EXPO 5F MPA-1 (CATHETERS) ×3
CATH INFINITI 5 FR 3DRC (CATHETERS) ×3
CATH INFINITI 5 FR IM (CATHETERS) ×3
CATH INFINITI 5 FR LCB (CATHETERS) ×3
CATH INFINITI 5FR AL1 (CATHETERS) ×3
CATH INFINITI 5FR MULTPACK ANG (CATHETERS) ×3
CATH SWAN GANZ 7F STRAIGHT (CATHETERS) ×3
KIT HEART LEFT (KITS) ×3
PACK CARDIAC CATHETERIZATION (CUSTOM PROCEDURE TRAY) ×3
SHEATH PINNACLE 5F 10CM (SHEATH) ×3
SHEATH PINNACLE 7F 10CM (SHEATH) ×3
TRANSDUCER W/STOPCOCK (MISCELLANEOUS) ×3
WIRE EMERALD 3MM-J .025X260CM (WIRE) ×3
WIRE EMERALD 3MM-J .035X150CM (WIRE) ×3

## 2016-02-12 NOTE — Progress Notes (Signed)
CARDIAC REHAB PHASE I   Pt awaiting cath, declines ambulation at this time. Pt states he is "ill" and "irritable." Completed CHF education with pt at bedside. Reviewed CHF booklet and zone tool, daily weights, sodium restrictions, heart healthy diet, carb counting, portion control, activity progression/exercise guidelines, and phase 2 cardiac rehab. Pt verbalized understanding, pt able to answer teach back questions, however, pt seems to have limited insight into disease process and is non-accepting of dietary recommendations. Pt states he is frustrated and "you're wasting your time." Pt states "what is the point?, I give up." Pt states he does not have hope that his symptoms will improve. Emotional support given to pt. Pt does agree to phase 2 cardiac rehab referral, will send to [facility] per pt request. Pt in recliner, call bell within reach. Will follow.   [place], RN, BSN 02/12/2016 3:16 PM

## 2016-02-12 NOTE — Progress Notes (Signed)
Site area: Rt fem art and Rt fem vein Site Prior to Removal:  Level 0 Pressure Applied For:53min Manual:   yes Patient Status During Pull:  A/O Post Pull Site:  Level 0 Post Pull Instructions Given:  Instructions for post sheath removal given and pt understands. Post Pull Pulses Present: Unable to access due to special pressure wraps for edema. Rt fem pulse was intact. Rt leg appropriate in color. No mottling, warm to the touch.  Dressing Applied: tegaderm and 4x4  Bedrest begins @ 18:16:00 Comments: Pt leaves cath lab procedure room in stable condition. Rt groin unremarkable. Dressing is CDI.

## 2016-02-12 NOTE — H&P (View-Only) (Signed)
Advanced Heart Failure Rounding Note  Referring Physician: Darylene Price, FNP Primary Care: [facility] Primary HF:  New (Dr. Haroldine Laws)  Subjective:    Echo 02/10/16 LVEF 15%, Mild MR, Mod LAE, Severe RAE, severely reduced RV function, PA peak pressure 31 mm Hg.   S/p US paracentesis 02/10/16 with 5.2 L out  States he feels the best he has in a long time, though slightly worried about his RLE wounds.  WOC saw and now wrapped. Denies SOB with walking halls.  Leg edema improved overall.   Out 2.8 L and down another 4 lbs.     Objective:   Weight Range: 231 lb 12.8 oz (105.144 kg) Body mass index is 34.22 kg/(m^2).   Vital Signs:   Temp:  [97.9 F (36.6 C)-98.3 F (36.8 C)] 98.2 F (36.8 C) (05/11 0555) Pulse Rate:  [74-86] 82 (05/11 0555) Resp:  [18-20] 18 (05/11 0555) BP: (87-109)/(57-83) 95/67 mmHg (05/11 0555) SpO2:  [95 %-97 %] 95 % (05/11 0555) Weight:  [231 lb 12.8 oz (105.144 kg)] 231 lb 12.8 oz (105.144 kg) (05/11 0555) Last BM Date: 02/09/16  Weight change: Filed Weights   02/11/16 0525 02/11/16 0807 02/12/16 0555  Weight: 244 lb 7.8 oz (110.9 kg) 235 lb 3.7 oz (106.7 kg) 231 lb 12.8 oz (105.144 kg)    Intake/Output:   Intake/Output Summary (Last 24 hours) at 02/12/16 1018 Last data filed at 02/12/16 0847  Gross per 24 hour  Intake    360 ml  Output   3400 ml  Net  -3040 ml     Physical Exam: General: Sitting in chair. NAD HEENT: normal Neck: supple. JVD difficult to assess, Still appears elevated at least 8-9 cm.  Carotids 2+ bilat; no bruits. No lymphadenopathy or thyromegaly appreciated. Cor: PMI nondisplaced. RRR. No rubs, gallops or murmurs. Lungs: Diminished R base. L with mild basilar crackles.  Abdomen: Obese, tight, mildly tender, mildly distended. No appreciable hepatosplenomegaly. No bruits or masses. +BS Extremities: no cyanosis, clubbing. Chronic woody 1-2+ edema into thighs. Bilateral chronic venous stasis changes R>L  with several bullae.  Neuro: alert & oriented x 3, cranial nerves grossly intact. moves all 4 extremities w/o difficulty. Affect pleasant.  Telemetry: Reviewed, NSR  Labs: CBC  Recent Labs  02/09/16 1630 02/11/16 2224  WBC 7.4 5.7  HGB 12.2* 11.7*  HCT 37.8* 36.8*  MCV 88.7 88.5  PLT 161 Q000111Q*   Basic Metabolic Panel  Recent Labs  02/09/16 1630  02/11/16 0255 02/12/16 0238  NA 138  < > 137 136  K 4.1  < > 3.7 3.9  CL 104  < > 98* 96*  CO2 24  < > 28 30  GLUCOSE 84  < > 185* 152*  BUN 17  < > 19 17  CREATININE 1.02  < > 0.99 1.04  CALCIUM 8.7*  < > 8.4* 8.5*  MG 1.7  --   --  1.6*  < > = values in this interval not displayed. Liver Function Tests  Recent Labs  02/09/16 1630  AST 18  ALT 16*  ALKPHOS 104  BILITOT 0.6  PROT 6.7  ALBUMIN 3.3*   No results for input(s): LIPASE, AMYLASE in the last 72 hours. Cardiac Enzymes  Recent Labs  02/09/16 2134 02/10/16 0254 02/10/16 0926  TROPONINI 0.06* 0.05* 0.04*    BNP: BNP (last 3 results)  Recent Labs  01/12/16 1922 01/26/16 0911 02/09/16 1515  BNP 2677.0* 1578.0* 1346.8*    ProBNP (  last 3 results) No results for input(s): PROBNP in the last 8760 hours.   D-Dimer No results for input(s): DDIMER in the last 72 hours. Hemoglobin A1C  Recent Labs  02/10/16 0254  HGBA1C 8.4*   Fasting Lipid Panel No results for input(s): CHOL, HDL, LDLCALC, TRIG, CHOLHDL, LDLDIRECT in the last 72 hours. Thyroid Function Tests  Recent Labs  02/10/16 0254  TSH 1.007    Other results:  Imaging/Studies:  US Paracentesis  02/10/2016  INDICATION: CHF,CAD, recurrent ascites. Request made for therapeutic paracentesis. EXAM: ULTRASOUND GUIDED THERAPEUTIC PARACENTESIS MEDICATIONS: None. COMPLICATIONS: None immediate. PROCEDURE: Informed written consent was obtained from the patient after a discussion of the risks, benefits and alternatives to treatment. A timeout was performed prior to the initiation of the  procedure. Initial ultrasound scanning demonstrates a large amount of ascites within the left lower abdominal quadrant. The left lower abdomen was prepped and draped in the usual sterile fashion. 1% lidocaine was used for local anesthesia. Following this, a Yueh catheter was introduced. An ultrasound image was saved for documentation purposes. The paracentesis was performed. The catheter was removed and a dressing was applied. The patient tolerated the procedure well without immediate post procedural complication. FINDINGS: A total of approximately 5.2 liters of yellow fluid was removed. IMPRESSION: Successful ultrasound-guided therapeutic paracentesis yielding 5.2 liters of peritoneal fluid. Read by: Rowe Robert, PA-C Electronically Signed   By: Sandi Mariscal M.D.   On: 02/10/2016 16:15   US Abdomen Limited Ruq  02/11/2016  CLINICAL DATA:  Fatty liver, status post cholecystectomy EXAM: US ABDOMEN LIMITED - RIGHT UPPER QUADRANT COMPARISON:  10/29/2015 FINDINGS: Gallbladder: Surgically absent Common bile duct: Diameter: 9 mm Liver: Coarse hepatic echotexture with hyperechoic hepatic parenchyma. No focal hepatic lesion is seen. Additional comments: Mild perihepatic ascites. IMPRESSION: Coarse hepatic echotexture with hyperechoic hepatic parenchyma, suggesting hepatocellular disease such as cirrhosis or hepatic steatosis. No focal hepatic lesion is seen. Mild perihepatic ascites. Status post cholecystectomy. Electronically Signed   By: Julian Hy M.D.   On: 02/11/2016 16:15    Latest Echo  Latest Cath   Medications:     Scheduled Medications: . aspirin EC  81 mg Oral Daily  . atorvastatin  20 mg Oral QPM  . digoxin  0.25 mg Oral Daily  . enoxaparin (LOVENOX) injection  40 mg Subcutaneous Q24H  . escitalopram  20 mg Oral Daily  . furosemide  80 mg Intravenous BID  . insulin aspart  0-15 Units Subcutaneous TID WC  . insulin glargine  30 Units Subcutaneous QHS  . losartan  25 mg Oral Daily  .  metolazone  2.5 mg Oral BID  . potassium chloride SA  40 mEq Oral BID  . sodium chloride flush  3 mL Intravenous Q12H  . sodium chloride flush  3 mL Intravenous Q12H  . spironolactone  12.5 mg Oral Daily    Infusions: . sodium chloride      PRN Medications: sodium chloride, sodium chloride, acetaminophen, albuterol, ondansetron (ZOFRAN) IV, sodium chloride flush, sodium chloride flush, zolpidem   Assessment   1. Acute on chronic systolic HF 2. HTN 3. CAD s/p CABG 4. DMII 5. COPD 6. R leg wounds.  Plan    Echo 02/10/16 LVEF 15%, Mild MR, Mod LAE, Severe RAE, severely reduced RV function, PA peak pressure 31 mm Hg.   S/p US paracentesis 02/10/16 with 5.2 L out  Got lasix and metolazone again this am. Creatinine stable. Good diuresis so far, 5.8 L and down 14 lbs.  Will address further s/p cath this afternoon.   Counseled extensively on fluid restriction and on 1500 mL fluid restriction.  HF navigator has spoken to him as well.   Appreciate WOC input for RLE wounds.   He will be high risk for admission and will need close HF clinic follow up with on-going disease state education.   Length of Stay: 3  [name] Friar PA-C 02/12/2016, 10:18 AM  Advanced Heart Failure Team Pager [phone] (M-F; 7a - 4p)  Please contact [facility] for night-coverage after hours (4p -7a ) and weekends on [email or web address]  Patient seen and examined with Oda Kilts, PA-C. We discussed all aspects of the encounter. I agree with the assessment and plan as stated above.   He has severe biventricular HF. Volume status improving with diuresis. No further CP. Renal function stable. Ab u/s with diffuse echogenicity (fatty liver vs cirrhosis).   Will proceed with R/L cath today.   Bensimhon, Daniel,MD 4:48 PM

## 2016-02-12 NOTE — Interval H&P Note (Signed)
History and Physical Interval Note:  02/12/2016 4:51 PM  Ronald Miller.  has presented today for surgery, with the diagnosis of hf  The various methods of treatment have been discussed with the patient and family. After consideration of risks, benefits and other options for treatment, the patient has consented to  Procedure(s): Right/Left Heart Cath and Coronary/Graft Angiography (N/A) possible angioplasty as a surgical intervention .  The patient's history has been reviewed, patient examined, no change in status, stable for surgery.  I have reviewed the patient's chart and labs.  Questions were answered to the patient's satisfaction.     [name], Quillian Quince

## 2016-02-12 NOTE — Progress Notes (Signed)
Advanced Heart Failure Rounding Note  Referring Physician: Darylene Price, FNP Primary Care: [facility] Primary HF:  New (Dr. Haroldine Laws)  Subjective:    Echo 02/10/16 LVEF 15%, Mild MR, Mod LAE, Severe RAE, severely reduced RV function, PA peak pressure 31 mm Hg.   S/p US paracentesis 02/10/16 with 5.2 L out  States he feels the best he has in a long time, though slightly worried about his RLE wounds.  WOC saw and now wrapped. Denies SOB with walking halls.  Leg edema improved overall.   Out 2.8 L and down another 4 lbs.     Objective:   Weight Range: 231 lb 12.8 oz (105.144 kg) Body mass index is 34.22 kg/(m^2).   Vital Signs:   Temp:  [97.9 F (36.6 C)-98.3 F (36.8 C)] 98.2 F (36.8 C) (05/11 0555) Pulse Rate:  [74-86] 82 (05/11 0555) Resp:  [18-20] 18 (05/11 0555) BP: (87-109)/(57-83) 95/67 mmHg (05/11 0555) SpO2:  [95 %-97 %] 95 % (05/11 0555) Weight:  [231 lb 12.8 oz (105.144 kg)] 231 lb 12.8 oz (105.144 kg) (05/11 0555) Last BM Date: 02/09/16  Weight change: Filed Weights   02/11/16 0525 02/11/16 0807 02/12/16 0555  Weight: 244 lb 7.8 oz (110.9 kg) 235 lb 3.7 oz (106.7 kg) 231 lb 12.8 oz (105.144 kg)    Intake/Output:   Intake/Output Summary (Last 24 hours) at 02/12/16 1018 Last data filed at 02/12/16 0847  Gross per 24 hour  Intake    360 ml  Output   3400 ml  Net  -3040 ml     Physical Exam: General: Sitting in chair. NAD HEENT: normal Neck: supple. JVD difficult to assess, Still appears elevated at least 8-9 cm.  Carotids 2+ bilat; no bruits. No lymphadenopathy or thyromegaly appreciated. Cor: PMI nondisplaced. RRR. No rubs, gallops or murmurs. Lungs: Diminished R base. L with mild basilar crackles.  Abdomen: Obese, tight, mildly tender, mildly distended. No appreciable hepatosplenomegaly. No bruits or masses. +BS Extremities: no cyanosis, clubbing. Chronic woody 1-2+ edema into thighs. Bilateral chronic venous stasis changes R>L  with several bullae.  Neuro: alert & oriented x 3, cranial nerves grossly intact. moves all 4 extremities w/o difficulty. Affect pleasant.  Telemetry: Reviewed, NSR  Labs: CBC  Recent Labs  02/09/16 1630 02/11/16 2224  WBC 7.4 5.7  HGB 12.2* 11.7*  HCT 37.8* 36.8*  MCV 88.7 88.5  PLT 161 Q000111Q*   Basic Metabolic Panel  Recent Labs  02/09/16 1630  02/11/16 0255 02/12/16 0238  NA 138  < > 137 136  K 4.1  < > 3.7 3.9  CL 104  < > 98* 96*  CO2 24  < > 28 30  GLUCOSE 84  < > 185* 152*  BUN 17  < > 19 17  CREATININE 1.02  < > 0.99 1.04  CALCIUM 8.7*  < > 8.4* 8.5*  MG 1.7  --   --  1.6*  < > = values in this interval not displayed. Liver Function Tests  Recent Labs  02/09/16 1630  AST 18  ALT 16*  ALKPHOS 104  BILITOT 0.6  PROT 6.7  ALBUMIN 3.3*   No results for input(s): LIPASE, AMYLASE in the last 72 hours. Cardiac Enzymes  Recent Labs  02/09/16 2134 02/10/16 0254 02/10/16 0926  TROPONINI 0.06* 0.05* 0.04*    BNP: BNP (last 3 results)  Recent Labs  01/12/16 1922 01/26/16 0911 02/09/16 1515  BNP 2677.0* 1578.0* 1346.8*    ProBNP (  last 3 results) No results for input(s): PROBNP in the last 8760 hours.   D-Dimer No results for input(s): DDIMER in the last 72 hours. Hemoglobin A1C  Recent Labs  02/10/16 0254  HGBA1C 8.4*   Fasting Lipid Panel No results for input(s): CHOL, HDL, LDLCALC, TRIG, CHOLHDL, LDLDIRECT in the last 72 hours. Thyroid Function Tests  Recent Labs  02/10/16 0254  TSH 1.007    Other results:  Imaging/Studies:  US Paracentesis  02/10/2016  INDICATION: CHF,CAD, recurrent ascites. Request made for therapeutic paracentesis. EXAM: ULTRASOUND GUIDED THERAPEUTIC PARACENTESIS MEDICATIONS: None. COMPLICATIONS: None immediate. PROCEDURE: Informed written consent was obtained from the patient after a discussion of the risks, benefits and alternatives to treatment. A timeout was performed prior to the initiation of the  procedure. Initial ultrasound scanning demonstrates a large amount of ascites within the left lower abdominal quadrant. The left lower abdomen was prepped and draped in the usual sterile fashion. 1% lidocaine was used for local anesthesia. Following this, a Yueh catheter was introduced. An ultrasound image was saved for documentation purposes. The paracentesis was performed. The catheter was removed and a dressing was applied. The patient tolerated the procedure well without immediate post procedural complication. FINDINGS: A total of approximately 5.2 liters of yellow fluid was removed. IMPRESSION: Successful ultrasound-guided therapeutic paracentesis yielding 5.2 liters of peritoneal fluid. Read by: Rowe Robert, PA-C Electronically Signed   By: Sandi Mariscal M.D.   On: 02/10/2016 16:15   US Abdomen Limited Ruq  02/11/2016  CLINICAL DATA:  Fatty liver, status post cholecystectomy EXAM: US ABDOMEN LIMITED - RIGHT UPPER QUADRANT COMPARISON:  10/29/2015 FINDINGS: Gallbladder: Surgically absent Common bile duct: Diameter: 9 mm Liver: Coarse hepatic echotexture with hyperechoic hepatic parenchyma. No focal hepatic lesion is seen. Additional comments: Mild perihepatic ascites. IMPRESSION: Coarse hepatic echotexture with hyperechoic hepatic parenchyma, suggesting hepatocellular disease such as cirrhosis or hepatic steatosis. No focal hepatic lesion is seen. Mild perihepatic ascites. Status post cholecystectomy. Electronically Signed   By: Julian Hy M.D.   On: 02/11/2016 16:15    Latest Echo  Latest Cath   Medications:     Scheduled Medications: . aspirin EC  81 mg Oral Daily  . atorvastatin  20 mg Oral QPM  . digoxin  0.25 mg Oral Daily  . enoxaparin (LOVENOX) injection  40 mg Subcutaneous Q24H  . escitalopram  20 mg Oral Daily  . furosemide  80 mg Intravenous BID  . insulin aspart  0-15 Units Subcutaneous TID WC  . insulin glargine  30 Units Subcutaneous QHS  . losartan  25 mg Oral Daily  .  metolazone  2.5 mg Oral BID  . potassium chloride SA  40 mEq Oral BID  . sodium chloride flush  3 mL Intravenous Q12H  . sodium chloride flush  3 mL Intravenous Q12H  . spironolactone  12.5 mg Oral Daily    Infusions: . sodium chloride      PRN Medications: sodium chloride, sodium chloride, acetaminophen, albuterol, ondansetron (ZOFRAN) IV, sodium chloride flush, sodium chloride flush, zolpidem   Assessment   1. Acute on chronic systolic HF 2. HTN 3. CAD s/p CABG 4. DMII 5. COPD 6. R leg wounds.  Plan    Echo 02/10/16 LVEF 15%, Mild MR, Mod LAE, Severe RAE, severely reduced RV function, PA peak pressure 31 mm Hg.   S/p US paracentesis 02/10/16 with 5.2 L out  Got lasix and metolazone again this am. Creatinine stable. Good diuresis so far, 5.8 L and down 14 lbs.  Will address further s/p cath this afternoon.   Counseled extensively on fluid restriction and on 1500 mL fluid restriction.  HF navigator has spoken to him as well.   Appreciate WOC input for RLE wounds.   He will be high risk for admission and will need close HF clinic follow up with on-going disease state education.   Length of Stay: 3  [name] Friar PA-C 02/12/2016, 10:18 AM  Advanced Heart Failure Team Pager [phone] (M-F; 7a - 4p)  Please contact [facility] for night-coverage after hours (4p -7a ) and weekends on [email or web address]  Patient seen and examined with Oda Kilts, PA-C. We discussed all aspects of the encounter. I agree with the assessment and plan as stated above.   He has severe biventricular HF. Volume status improving with diuresis. No further CP. Renal function stable. Ab u/s with diffuse echogenicity (fatty liver vs cirrhosis).   Will proceed with R/L cath today.   Bensimhon, Daniel,MD 4:48 PM

## 2016-02-12 NOTE — Care Management Important Message (Signed)
Important Message  Patient Details  Name: Ronald Miller. MRN: [id number] Date of Birth: [birth year]   Medicare Important Message Given:  Yes    Barb Merino Iretta Mangrum 02/12/2016, 2:32 PM

## 2016-02-13 ENCOUNTER — Encounter (HOSPITAL_COMMUNITY): Payer: Self-pay | Admitting: Internal Medicine

## 2016-02-13 LAB — BASIC METABOLIC PANEL
Anion gap: 11 (ref 5–15)
BUN: 13 mg/dL (ref 6–20)
CO2: 29 mmol/L (ref 22–32)
CREATININE: 1.01 mg/dL (ref 0.61–1.24)
Calcium: 8.8 mg/dL — ABNORMAL LOW (ref 8.9–10.3)
Chloride: 97 mmol/L — ABNORMAL LOW (ref 101–111)
Glucose, Bld: 143 mg/dL — ABNORMAL HIGH (ref 65–99)
Potassium: 4.3 mmol/L (ref 3.5–5.1)
SODIUM: 137 mmol/L (ref 135–145)

## 2016-02-13 LAB — GLUCOSE, CAPILLARY
GLUCOSE-CAPILLARY: 151 mg/dL — AB (ref 65–99)
GLUCOSE-CAPILLARY: 196 mg/dL — AB (ref 65–99)
Glucose-Capillary: 175 mg/dL — ABNORMAL HIGH (ref 65–99)
Glucose-Capillary: 178 mg/dL — ABNORMAL HIGH (ref 65–99)

## 2016-02-13 MED ORDER — METOLAZONE 2.5 MG PO TABS
2.5000 mg | ORAL_TABLET | Freq: Once | ORAL | Status: AC
  Administered 2016-02-13: 2.5 mg via ORAL
  Filled 2016-02-13: qty 1

## 2016-02-13 NOTE — Progress Notes (Signed)
CARDIAC REHAB PHASE I   PRE:  Rate/Rhythm: 89 pacing    BP: sitting 100/63    SaO2: 93 RA  MODE:  Ambulation: 460 ft   POST:  Rate/Rhythm: 102 pacing    BP: sitting 109/67     SaO2: 96 RA  Tolerated fairly well with slow pace. Tired after walk. Slightly more receptive today, gently gave reminders of daily wts, low sodium, exercise. He is planning to attend [facility]II as he is depressed regarding his cath results. [id number]   [place], ACSM 02/13/2016 11:09 AM

## 2016-02-13 NOTE — Progress Notes (Signed)
Received Diabetes coordinator consult. Spoke with patient about his diabetes. States that he was diagnosed in 2000. Lived in [place], has been here for about 6 months. HgbA1C is 8.4% at this time. States that he once was 13% and had been on Metformin.  He stopped taking Metformin because it made him feel bad.  Is now on insulin.  He takes Lantus 30 units every HS, Novolog 10-12 units TID with meals at home.  States that he checks blood sugars 3 times per day.  Has been on insulin for about 2 years.  States that he would like to talk to a dietician about meal plans that he could follow. Will continue to follow up with a doctor after discharge for diabetes control.  Harvel Ricks RN BSN CDE

## 2016-02-13 NOTE — Progress Notes (Signed)
Pt very irritable upon shift change, stating he will not lay in bed when dinner arrives. Pt encouraged to abide by agreed upon plan of care. Pt ordered dinner around 7:30 in front of this RN, but dinner never arrived. Meal/snack from floor refrigerator given to patient around 22:00. Bedrest completed at 22:30 and IVF saline locked. Pt much less irritable at this time and apologized. Will continue to monitor.

## 2016-02-13 NOTE — Progress Notes (Signed)
Advanced Heart Failure Rounding Note  Referring Physician: Darylene Price, FNP Primary Care: [facility] Primary HF:  New (Dr. Haroldine Laws)  Subjective:    Echo 02/10/16 LVEF 15%, Mild MR, Mod LAE, Severe RAE, severely reduced RV function, PA peak pressure 31 mm Hg.   S/p US paracentesis 02/10/16 with 5.2 L out  [facility] yesterday with severe native 3v CAD, All grafts occluded except LIMA to LAD with faint L to R collaterals, elevated filling pressures with normal cardiac output.   No SOB with exertion. Still having some soreness on RLE.   Worried about his diet at home.  Didn't think he was doing all that bad.   Out 2.3 L and down another 6 lbs.   R/LHC 02/12/16  Prox RCA lesion, 100% stenosed.  Prox Cx to Dist Cx lesion, 95% stenosed.  1st Mrg lesion, 100% stenosed.  2nd Mrg lesion, 100% stenosed.  Prox LAD lesion, 100% stenosed.  Ost LM to LM lesion, 50% stenosed.  Origin lesion, 100% stenosed.  Origin lesion, 100% stenosed.  Origin lesion, 100% stenosed.  Findings: Ao = 82/58 (69) LV = 84/15/27 RA = 14 RV = 45/11/17 PA = 49/24 (31) PCW = 28 Fick cardiac output/index = 5.3/2.4 PVR = 0.6 WU SVR = 837 FA sat = 98% PA sat = 63%, 62%  Objective:   Weight Range: 225 lb 9.6 oz (102.331 kg) Body mass index is 33.3 kg/(m^2).   Vital Signs:   Temp:  [97.5 F (36.4 C)-98 F (36.7 C)] 98 F (36.7 C) (05/12 1122) Pulse Rate:  [70-92] 83 (05/12 1122) Resp:  [12-20] 18 (05/12 1122) BP: (91-101)/(59-71) 91/65 mmHg (05/12 1122) SpO2:  [91 %-100 %] 95 % (05/12 1122) Weight:  [225 lb 9.6 oz (102.331 kg)] 225 lb 9.6 oz (102.331 kg) (05/12 0614) Last BM Date: 02/12/16  Weight change: Filed Weights   02/11/16 0807 02/12/16 0555 02/13/16 0614  Weight: 235 lb 3.7 oz (106.7 kg) 231 lb 12.8 oz (105.144 kg) 225 lb 9.6 oz (102.331 kg)    Intake/Output:   Intake/Output Summary (Last 24 hours) at 02/13/16 1149 Last data filed at 02/13/16 1026  Gross  per 24 hour  Intake   1026 ml  Output   3725 ml  Net  -2699 ml     Physical Exam: General: Sitting in chair. NAD HEENT: normal Neck: supple. JVD difficult to assess, likely ~8-9 cm.  Carotids 2+ bilat; no bruits. No lymphadenopathy or thyromegaly appreciated. Cor: PMI nondisplaced. RRR. No rubs, gallops or murmurs. Lungs: Diminished R base. L with mild basilar crackles.  Abdomen: Obese, soft, mildly tender, mildly distended. No appreciable HSM. No bruits or masses. +BS Extremities: no cyanosis, clubbing. Chronic woody 1+ edema to knees Bilateral chronic venous stasis changes R>L with several bullae.  Neuro: alert & oriented x 3, cranial nerves grossly intact. moves all 4 extremities w/o difficulty. Affect pleasant.  Telemetry: Reviewed, NSR  Labs: CBC  Recent Labs  02/11/16 2224 02/12/16 1902  WBC 5.7 5.5  HGB 11.7* 12.3*  HCT 36.8* 37.7*  MCV 88.5 90.2  PLT 145* [id number]   Basic Metabolic Panel  Recent Labs  02/12/16 0238 02/12/16 1902 02/13/16 0522  NA 136  --  137  K 3.9  --  4.3  CL 96*  --  97*  CO2 30  --  29  GLUCOSE 152*  --  143*  BUN 17  --  13  CREATININE 1.04 1.03 1.01  CALCIUM 8.5*  --  8.8*  MG 1.6*  --   --    Liver Function Tests No results for input(s): AST, ALT, ALKPHOS, BILITOT, PROT, ALBUMIN in the last 72 hours. No results for input(s): LIPASE, AMYLASE in the last 72 hours. Cardiac Enzymes No results for input(s): CKTOTAL, CKMB, CKMBINDEX, TROPONINI in the last 72 hours.  BNP: BNP (last 3 results)  Recent Labs  01/12/16 1922 01/26/16 0911 02/09/16 1515  BNP 2677.0* 1578.0* 1346.8*    ProBNP (last 3 results) No results for input(s): PROBNP in the last 8760 hours.   D-Dimer No results for input(s): DDIMER in the last 72 hours. Hemoglobin A1C No results for input(s): HGBA1C in the last 72 hours. Fasting Lipid Panel No results for input(s): CHOL, HDL, LDLCALC, TRIG, CHOLHDL, LDLDIRECT in the last 72 hours. Thyroid Function  Tests No results for input(s): TSH, T4TOTAL, T3FREE, THYROIDAB in the last 72 hours.  Invalid input(s): FREET3  Other results:  Imaging/Studies:  US Abdomen Limited Ruq  02/11/2016  CLINICAL DATA:  Fatty liver, status post cholecystectomy EXAM: US ABDOMEN LIMITED - RIGHT UPPER QUADRANT COMPARISON:  10/29/2015 FINDINGS: Gallbladder: Surgically absent Common bile duct: Diameter: 9 mm Liver: Coarse hepatic echotexture with hyperechoic hepatic parenchyma. No focal hepatic lesion is seen. Additional comments: Mild perihepatic ascites. IMPRESSION: Coarse hepatic echotexture with hyperechoic hepatic parenchyma, suggesting hepatocellular disease such as cirrhosis or hepatic steatosis. No focal hepatic lesion is seen. Mild perihepatic ascites. Status post cholecystectomy. Electronically Signed   By: Julian Hy M.D.   On: 02/11/2016 16:15    Latest Echo  Latest Cath   Medications:     Scheduled Medications: . aspirin EC  81 mg Oral Daily  . atorvastatin  20 mg Oral QPM  . digoxin  0.25 mg Oral Daily  . enoxaparin (LOVENOX) injection  40 mg Subcutaneous Q24H  . escitalopram  20 mg Oral Daily  . furosemide  80 mg Intravenous BID  . insulin aspart  0-15 Units Subcutaneous TID WC  . insulin glargine  30 Units Subcutaneous QHS  . losartan  25 mg Oral Daily  . metolazone  2.5 mg Oral BID  . potassium chloride SA  40 mEq Oral BID  . sodium chloride flush  3 mL Intravenous Q12H  . sodium chloride flush  3 mL Intravenous Q12H  . spironolactone  12.5 mg Oral Daily    Infusions:    PRN Medications: sodium chloride, sodium chloride, acetaminophen, acetaminophen, albuterol, ondansetron (ZOFRAN) IV, oxyCODONE, sodium chloride flush, sodium chloride flush, zolpidem   Assessment   1. Acute on chronic systolic HF 2. HTN 3. CAD s/p CABG 4. DMII 5. COPD 6. R leg wounds.  Plan    Echo 02/10/16 LVEF 15%, Mild MR, Mod LAE, Severe RAE, severely reduced RV function, PA peak pressure 31 mm  Hg.   S/p US paracentesis 02/10/16 with 5.2 L out  Cath as above.   Out 8.5 L and down 20 lbs from admit.  Continue  IV lasix for today and likely transition to po tomorrow. Had morning metolazone, will hold evening dose as to not overshoot.    He was taking lasix 40/20 mg at home.  Will need to be switched to torsemide and will likely need at least 20 mg BID.   Counseled extensively on fluid restriction and on 1500 mL fluid restriction.  HF navigator has spoken to him as well.   Appreciate WOC input for RLE wounds.   He will be high risk for admission and will need close  HF clinic follow up with on-going disease state education.   Home later today vs tomorrow with one more day of diuresis  Length of Stay: 4  [name] Friar PA-C 02/13/2016, 11:49 AM  Advanced Heart Failure Team Pager [phone] (M-F; 7a - 4p)  Please contact [facility] for night-coverage after hours (4p -7a ) and weekends on [email or web address]   Patient seen and examined with Oda Kilts, PA-C. We discussed all aspects of the encounter. I agree with the assessment and plan as stated above.   Cath results reviewed with him at length. He has severe native CAD with all SVGs occluded. Only LIMA patent. No options for revasc. Volume status was also elevated. He also has severe biventricular dysfunction and extensive fatty liver. I talked to him at length about the fact that he may need advanced therapies at some point but he would only be a candidate if he lost weight and was more compliant with behavioral modification.   Would continue diuresis today. He is very eager to go home tomorrow. If ready can go home on   Torsemide 40/20 Toprol 25 Losartan 25 Digoxin 0.125 Ecasa 81 Atorva 20  Lezlie Ritchey,MD 3:37 PM

## 2016-02-14 LAB — BASIC METABOLIC PANEL
ANION GAP: 12 (ref 5–15)
BUN: 14 mg/dL (ref 6–20)
CALCIUM: 9.2 mg/dL (ref 8.9–10.3)
CHLORIDE: 97 mmol/L — AB (ref 101–111)
CO2: 29 mmol/L (ref 22–32)
CREATININE: 1.03 mg/dL (ref 0.61–1.24)
GFR calc Af Amer: >60 mL/min (ref >60)
GFR calc non Af Amer: >60 mL/min (ref >60)
Glucose, Bld: 147 mg/dL — ABNORMAL HIGH (ref 65–99)
Potassium: 4 mmol/L (ref 3.5–5.1)
SODIUM: 138 mmol/L (ref 135–145)

## 2016-02-14 LAB — GLUCOSE, CAPILLARY
GLUCOSE-CAPILLARY: 150 mg/dL — AB (ref 65–99)
GLUCOSE-CAPILLARY: 173 mg/dL — AB (ref 65–99)

## 2016-02-14 MED ORDER — LOSARTAN POTASSIUM 25 MG PO TABS: 25.0000 mg | ORAL_TABLET | Freq: Every day | ORAL | Status: DC

## 2016-02-14 MED ORDER — SPIRONOLACTONE 25 MG PO TABS
12.5000 mg | ORAL_TABLET | Freq: Every day | ORAL | Status: DC
Start: 2016-02-14 — End: 2016-02-19

## 2016-02-14 MED ORDER — INSULIN ASPART 100 UNIT/ML ~~LOC~~ SOLN: 0.0000 [IU] | Freq: Three times a day (TID) | SUBCUTANEOUS | Status: DC

## 2016-02-14 MED ORDER — TORSEMIDE 20 MG PO TABS: 20.0000 mg | ORAL_TABLET | Freq: Two times a day (BID) | ORAL | Status: DC

## 2016-02-14 NOTE — Progress Notes (Signed)
Ronald Miller does not want to walk this morning, "I would like to get some more rest." Will check with the patient later.

## 2016-02-14 NOTE — Progress Notes (Signed)
Pt discharged to home D/C IV and tele. Discharge instructions given Questions given.

## 2016-02-14 NOTE — Progress Notes (Addendum)
Patient ID: Ronald Queener., male   DOB: [birth year], 60 y.o.   MRN: [id number]     Advanced Heart Failure Rounding Note  Referring Physician: Darylene Price, FNP Primary Care: [facility] Primary HF:  New (Dr. Haroldine Laws)  Subjective:    Echo 02/10/16 LVEF 15%, Mild MR, Mod LAE, Severe RAE, severely reduced RV function, PA peak pressure 31 mm Hg.   S/p US paracentesis 02/10/16 with 5.2 L out  [facility] yesterday with severe native 3v CAD, All grafts occluded except LIMA to LAD with faint L to R collaterals, elevated filling pressures with normal cardiac output.   Doing well today, no dyspnea.  Weight down another 6 lbs.  Creatinine stable.   R/LHC 02/12/16  Prox RCA lesion, 100% stenosed.  Prox Cx to Dist Cx lesion, 95% stenosed.  1st Mrg lesion, 100% stenosed.  2nd Mrg lesion, 100% stenosed.  Prox LAD lesion, 100% stenosed.  Ost LM to LM lesion, 50% stenosed.  Origin lesion, 100% stenosed.  Origin lesion, 100% stenosed.  Origin lesion, 100% stenosed.  Findings: Ao = 82/58 (69) LV = 84/15/27 RA = 14 RV = 45/11/17 PA = 49/24 (31) PCW = 28 Fick cardiac output/index = 5.3/2.4 PVR = 0.6 WU SVR = 837 FA sat = 98% PA sat = 63%, 62%  Objective:   Weight Range: 219 lb 3.2 oz (99.428 kg) Body mass index is 32.36 kg/(m^2).   Vital Signs:   Temp:  [97.6 F (36.4 C)-98.1 F (36.7 C)] 98.1 F (36.7 C) (05/13 0500) Pulse Rate:  [84-88] 88 (05/13 0500) Resp:  [18] 18 (05/13 0500) BP: (94-105)/(62-72) 94/62 mmHg (05/13 0500) SpO2:  [93 %-96 %] 93 % (05/13 0500) Weight:  [219 lb 3.2 oz (99.428 kg)] 219 lb 3.2 oz (99.428 kg) (05/13 0500) Last BM Date: 02/14/16 (small)  Weight change: Filed Weights   02/12/16 0555 02/13/16 0614 02/14/16 0500  Weight: 231 lb 12.8 oz (105.144 kg) 225 lb 9.6 oz (102.331 kg) 219 lb 3.2 oz (99.428 kg)    Intake/Output:   Intake/Output Summary (Last 24 hours) at 02/14/16 1258 Last data filed at 02/14/16 0900  Gross per 24  hour  Intake   1180 ml  Output   1525 ml  Net   -345 ml     Physical Exam: General: Sitting in chair. NAD HEENT: normal Neck: supple. No JVD.  Carotids 2+ bilat; no bruits. No lymphadenopathy or thyromegaly appreciated. Cor: PMI nondisplaced. RRR. No rubs, gallops or murmurs. Lungs: Diminished R base. L with mild basilar crackles.  Abdomen: Obese, soft, mildly tender, mildly distended. No appreciable HSM. No bruits or masses. +BS Extremities: no cyanosis, clubbing.  No edema on left, right leg wrapped.  Neuro: alert & oriented x 3, cranial nerves grossly intact. moves all 4 extremities w/o difficulty. Affect pleasant.  Telemetry: Reviewed, NSR  Labs: CBC  Recent Labs  02/11/16 2224 02/12/16 1902  WBC 5.7 5.5  HGB 11.7* 12.3*  HCT 36.8* 37.7*  MCV 88.5 90.2  PLT 145* [id number]   Basic Metabolic Panel  Recent Labs  02/12/16 0238  02/13/16 0522 02/14/16 0633  NA 136  --  137 138  K 3.9  --  4.3 4.0  CL 96*  --  97* 97*  CO2 30  --  29 29  GLUCOSE 152*  --  143* 147*  BUN 17  --  13 14  CREATININE 1.04  < > 1.01 1.03  CALCIUM 8.5*  --  8.8* 9.2  MG 1.6*  --   --   --   < > = values in this interval not displayed. Liver Function Tests No results for input(s): AST, ALT, ALKPHOS, BILITOT, PROT, ALBUMIN in the last 72 hours. No results for input(s): LIPASE, AMYLASE in the last 72 hours. Cardiac Enzymes No results for input(s): CKTOTAL, CKMB, CKMBINDEX, TROPONINI in the last 72 hours.  BNP: BNP (last 3 results)  Recent Labs  01/12/16 1922 01/26/16 0911 02/09/16 1515  BNP 2677.0* 1578.0* 1346.8*    ProBNP (last 3 results) No results for input(s): PROBNP in the last 8760 hours.   D-Dimer No results for input(s): DDIMER in the last 72 hours. Hemoglobin A1C No results for input(s): HGBA1C in the last 72 hours. Fasting Lipid Panel No results for input(s): CHOL, HDL, LDLCALC, TRIG, CHOLHDL, LDLDIRECT in the last 72 hours. Thyroid Function Tests No results for  input(s): TSH, T4TOTAL, T3FREE, THYROIDAB in the last 72 hours.  Invalid input(s): FREET3  Other results:  Imaging/Studies:  No results found.  Latest Echo  Latest Cath   Medications:     Scheduled Medications: . aspirin EC  81 mg Oral Daily  . atorvastatin  20 mg Oral QPM  . digoxin  0.25 mg Oral Daily  . enoxaparin (LOVENOX) injection  40 mg Subcutaneous Q24H  . escitalopram  20 mg Oral Daily  . insulin aspart  0-15 Units Subcutaneous TID WC  . insulin glargine  30 Units Subcutaneous QHS  . losartan  25 mg Oral Daily  . potassium chloride SA  40 mEq Oral BID  . sodium chloride flush  3 mL Intravenous Q12H  . sodium chloride flush  3 mL Intravenous Q12H  . spironolactone  12.5 mg Oral Daily    Infusions:    PRN Medications: sodium chloride, sodium chloride, acetaminophen, acetaminophen, albuterol, ondansetron (ZOFRAN) IV, oxyCODONE, sodium chloride flush, sodium chloride flush, zolpidem   Assessment   1. Acute on chronic systolic HF 2. HTN 3. CAD s/p CABG 4. DMII 5. COPD 6. R leg wounds.  Plan    Echo 02/10/16 LVEF 15%, Mild MR, Mod LAE, Severe RAE, severely reduced RV function, PA peak pressure 31 mm Hg.   S/p US paracentesis 02/10/16 with 5.2 L out  Down another 6 lbs.  Creatinine stable.    He has severe native CAD with all SVGs occluded. Only LIMA patent. No options for revascularization. Volume status was also elevated, much better now.  Got dose of IV Lasix this morning. He also has severe biventricular dysfunction and extensive fatty liver.  We have talked to him at length about the fact that he may need advanced therapies at some point but he would only be a candidate if he lost weight and was more compliant with behavioral modification.   He can go home today.  He will need CHF clinic followup in 1 week with Dr Haroldine Laws or midlevel.   Meds for home:  Torsemide 40 qam /20 qpm Toprol XL 25 daily Losartan 25 daily Digoxin 0.125 Ecasa 81 Atorva  20 KCl 40 daily  Dalton McLean,MD 12:58 PM  02/14/2016

## 2016-02-14 NOTE — Discharge Summary (Signed)
Discharge Summary    Patient ID: Ronald Miller.,  MRN: [id number], DOB/AGE: July 15, 1956 60 y.o.  Admit date: 02/09/2016 Discharge date: 02/14/2016  Primary Care Provider: [facility] Primary Cardiologist: Glori Bickers MD   Discharge Diagnoses    Active Problems:   Acute on chronic systolic (congestive) heart failure (HCC)   Allergies Allergies  Allergen Reactions  . Codeine Nausea And Vomiting    Diagnostic Studies/Procedures   Echocardiogram: 05/0-06/2016 Left ventricle: The cavity size was moderately dilated. There was  moderate concentric hypertrophy. Systolic function was severely  reduced. The estimated ejection fraction was 15-20%. Wall motion  was normal; there were no regional wall motion abnormalities.  Doppler parameters are consistent with restrictive physiology,  indicative of decreased left ventricular diastolic compliance  and/or increased left atrial pressure. - Ventricular septum: The contour showed diastolic flattening and  systolic flattening. - Aortic valve: Trileaflet; normal thickness leaflets. There was no  regurgitation. - Aortic root: The aortic root was normal in size. - Mitral valve: Structurally normal valve. There was mild  regurgitation. - Left atrium: The atrium was moderately dilated. - Right ventricle: The cavity size was severely dilated. Wall  thickness was normal. Systolic function was severely reduced. - Right atrium: The atrium was severely dilated. - Tricuspid valve: There was mild regurgitation. - Pulmonary arteries: PA peak pressure: 31 mm Hg (S).  Impressions:  - Moderately dilated LV with severely decreased LVEF estimated at  15-20%, diffuse hypokinesis, no LV thrombus.  Restrictive pattern of diastolic dysfunction.  RV is severely dilated with severely decreased systolic function.  RVSP is underestimated by RV dysfunction.  Right sided chamber dilatation suggestive of  significant  pulmonary hypertension.  Right and Left Heart Cath 02/12/2016 Dominance: Right   Left Main   . Ost LM to LM lesion, 50% stenosed.     Left Anterior Descending   . Prox LAD lesion, 100% stenosed.     Left Circumflex   . Prox Cx to Dist Cx lesion, 95% stenosed.   . First Obtuse Marginal Branch   . 1st Mrg lesion, 100% stenosed.   . Second Obtuse Marginal Branch   . 2nd Mrg lesion, 100% stenosed.     Right Coronary Artery   . Prox RCA lesion, 100% stenosed.   . Right Posterior Descending Artery   RPDA filled by collaterals from Dist LAD.     Graft Angiography    Graft to Dist RCA   . Origin lesion, 100% stenosed.     Graft to 1st Diag   . Origin lesion, 100% stenosed.     Graft to 1st Mrg   . Origin lesion, 100% stenosed.     LIMA Graft to Dist LAD       _____________   History of Present Illness    Mr. Vogelsong is a 60 year old male patient with known history of chronic systolic CHF, status post Medtronic ICD in 2014 history of coronary artery disease with four-vessel coronary artery bypass grafting in 2000, hypertension, past history of cocaine and tobacco abuse, depression, and COPD. The patient was seen in the advanced heart failure clinic on 02/09/2016 by Dr. Ronna Polio, he had gained 8-9 pounds over the last 24 hours, having abdominal distention, fluid blisters on the right leg and edema to his thighs. He was symptomatic with dyspnea on exertion PND and orthopnea. He did not take any medication the day that he was seen in the heart failure clinic as he had fallen  asleep and forgotten.  On evaluating the clinic the patient was found to be markedly volume overloaded on exam. He was admitted for IV diuresis. Possible paracentesis.   Hospital Course     After admission, the patient had echocardiogram on 02/10/2016, with LVEF of 15%, mild MR, moderate LAE, severe RA, severely reduced RV function, PA pressure 31 mmHg. The patient underwent a paracentesis  on 02/10/2016 was 5.2 L removed. He had a subsequent right and left heart catheterization completed by Dr. Haroldine Laws, on 02/12/2016.  Full report below, but revealed severe three-vessel CAD, OCCLUDED except limited LAD with faint left-to-right collaterals. The patient had elevated filling pressures with normal cardiac output.  He was continued continued on IV diuresis throughout hospitalization. His weight decreased from 247 pounds at the highest to 219 pounds on day of discharge. He was started on spironolactone, continued on digoxin, aspirin, losartan, he was switched to torsemide from Lasix 20 mg twice a day. And was placed on a 1500 cc fluid restriction diet. He is advised to follow-up in the advanced heart failure clinic within one week. The patient had extensive counseling on salt intake daily weight and fluid restriction prior to discharge.  _____________  Discharge Vitals Blood pressure 107/73, pulse 88, temperature 98.4 F (36.9 C), temperature source Oral, resp. rate 18, height 5\' 9"  (1.753 m), weight 219 lb 3.2 oz (99.428 kg), SpO2 96 %.  Filed Weights   02/12/16 0555 02/13/16 0614 02/14/16 0500  Weight: 231 lb 12.8 oz (105.144 kg) 225 lb 9.6 oz (102.331 kg) 219 lb 3.2 oz (99.428 kg)    Labs & Radiologic Studies     CBC  Recent Labs  02/11/16 2224 02/12/16 1902  WBC 5.7 5.5  HGB 11.7* 12.3*  HCT 36.8* 37.7*  MCV 88.5 90.2  PLT 145* [id number]   Basic Metabolic Panel  Recent Labs  02/12/16 0238  02/13/16 0522 02/14/16 0633  NA 136  --  137 138  K 3.9  --  4.3 4.0  CL 96*  --  97* 97*  CO2 30  --  29 29  GLUCOSE 152*  --  143* 147*  BUN 17  --  13 14  CREATININE 1.04  < > 1.01 1.03  CALCIUM 8.5*  --  8.8* 9.2  MG 1.6*  --   --   --   < > = values in this interval not displayed.   Dg Chest 2 View  02/09/2016  CLINICAL DATA:  Shortness of breath for 5 days. History of congestive heart failure. EXAM: CHEST  2 VIEW COMPARISON:  01/26/2016 and 01/12/2016. FINDINGS: PA  view was repeated. There is stable cardiomegaly status post median sternotomy and CABG. Left subclavian AICD leads appear unchanged. There is stable vascular congestion without overt pulmonary edema, confluent airspace opacity or significant pleural effusion. The bones appear unchanged. IMPRESSION: Stable postoperative chest with cardiomegaly and chronic vascular congestion. No acute cardiopulmonary process. Electronically Signed   By: Richardean Sale M.D.   On: 02/09/2016 19:42   Dg Chest 2 View  01/26/2016  CLINICAL DATA:  Shortness of breath. EXAM: CHEST  2 VIEW COMPARISON:  01/12/2016. FINDINGS: Prior CABG. AICD noted in stable position. Stable cardiomegaly. Low lung volumes with mild basilar atelectasis. No pleural effusion or pneumothorax. Degenerative changes thoracic spine . IMPRESSION: 1.  AICD in stable position.  Prior CABG.  Stable cardiomegaly. 2.  Low lung volumes with mild basilar atelectasis. Electronically Signed   By: Marcello Moores  Register   On: 01/26/2016 09:45  US Abdomen Limited  01/17/2016  CLINICAL DATA:  60 year old male with suspected ascites. EXAM: LIMITED ABDOMEN ULTRASOUND FOR ASCITES TECHNIQUE: Limited ultrasound survey for ascites was performed in all four abdominal quadrants. COMPARISON:  01/13/2016. FINDINGS: Small to moderate volume of ascites noted throughout the peritoneal cavity. IMPRESSION: 1. Small to moderate volume of ascites. Electronically Signed   By: Vinnie Langton M.D.   On: 01/17/2016 15:37   US Paracentesis  02/10/2016  INDICATION: CHF,CAD, recurrent ascites. Request made for therapeutic paracentesis. EXAM: ULTRASOUND GUIDED THERAPEUTIC PARACENTESIS MEDICATIONS: None. COMPLICATIONS: None immediate. PROCEDURE: Informed written consent was obtained from the patient after a discussion of the risks, benefits and alternatives to treatment. A timeout was performed prior to the initiation of the procedure. Initial ultrasound scanning demonstrates a large amount of  ascites within the left lower abdominal quadrant. The left lower abdomen was prepped and draped in the usual sterile fashion. 1% lidocaine was used for local anesthesia. Following this, a Yueh catheter was introduced. An ultrasound image was saved for documentation purposes. The paracentesis was performed. The catheter was removed and a dressing was applied. The patient tolerated the procedure well without immediate post procedural complication. FINDINGS: A total of approximately 5.2 liters of yellow fluid was removed. IMPRESSION: Successful ultrasound-guided therapeutic paracentesis yielding 5.2 liters of peritoneal fluid. Read by: Rowe Robert, PA-C Electronically Signed   By: Sandi Mariscal M.D.   On: 02/10/2016 16:15   US Paracentesis  01/27/2016  CLINICAL DATA:  CHF, recurrent ascites EXAM: ULTRASOUND GUIDED PARACENTESIS TECHNIQUE: The procedure, risks (including but not limited to bleeding, infection, organ damage ), benefits, and alternatives were explained to the patient. Questions regarding the procedure were encouraged and answered. The patient understands and consents to the procedure. Survey ultrasound of the abdomen was performed and an appropriate skin entry site in the right lateral abdomen was selected. Skin site was marked, prepped with Betadine, and draped in usual sterile fashion, and infiltrated locally with 1% lidocaine. A Safe-T-Centesis needle was advanced into the peritoneal space until fluid could be aspirated. The sheath was advanced and the needle removed. 5 L of clear yellowascites were aspirated. COMPLICATIONS: COMPLICATIONS none IMPRESSION: Technically successful ultrasound guided paracentesis, removing 5 L of ascites. Electronically Signed   By: Lucrezia Europe M.D.   On: 01/27/2016 16:07   US Abdomen Limited Ruq  02/11/2016  CLINICAL DATA:  Fatty liver, status post cholecystectomy EXAM: US ABDOMEN LIMITED - RIGHT UPPER QUADRANT COMPARISON:  10/29/2015 FINDINGS: Gallbladder: Surgically  absent Common bile duct: Diameter: 9 mm Liver: Coarse hepatic echotexture with hyperechoic hepatic parenchyma. No focal hepatic lesion is seen. Additional comments: Mild perihepatic ascites. IMPRESSION: Coarse hepatic echotexture with hyperechoic hepatic parenchyma, suggesting hepatocellular disease such as cirrhosis or hepatic steatosis. No focal hepatic lesion is seen. Mild perihepatic ascites. Status post cholecystectomy. Electronically Signed   By: Julian Hy M.D.   On: 02/11/2016 16:15    Disposition   Pt is being discharged home today in good condition.  Follow-up Plans & Appointments    Follow-up Information    Follow up with Glori Bickers, MD.   Specialty:  Cardiology   Why:  Our office will call you for appointment for one week.   Contact information:   [address] [place] [address] [phone]      Discharge Instructions    AMB referral to CHF clinic    Complete by:  As directed      Amb Referral to Cardiac Rehabilitation  Complete by:  As directed   Diagnosis:  Heart Failure (see criteria below if ordering Phase II)  Heart Failure Type:  Chronic Systolic     Diet - low sodium heart healthy    Complete by:  As directed      Increase activity slowly    Complete by:  As directed            Discharge Medications   Current Discharge Medication List    START taking these medications   Details  !! insulin aspart (NOVOLOG) 100 UNIT/ML injection Inject 0-15 Units into the skin 3 (three) times daily with meals. Qty: 10 mL, Refills: 11    losartan (COZAAR) 25 MG tablet Take 1 tablet (25 mg total) by mouth daily. Qty: 30 tablet, Refills: 6    spironolactone (ALDACTONE) 25 MG tablet Take 0.5 tablets (12.5 mg total) by mouth daily. Qty: 30 tablet, Refills: 10    torsemide (DEMADEX) 20 MG tablet Take 1 tablet (20 mg total) by mouth 2 (two) times daily. Qty: 60 tablet, Refills: 6     !! - Potential duplicate medications found. Please  discuss with provider.    CONTINUE these medications which have NOT CHANGED   Details  acetaminophen (TYLENOL) 500 MG tablet Take 1,000 mg by mouth every 6 (six) hours as needed for mild pain or headache.    albuterol (PROVENTIL HFA;VENTOLIN HFA) 108 (90 BASE) MCG/ACT inhaler Inhale 2 puffs into the lungs every 6 (six) hours as needed for wheezing or shortness of breath.    aspirin EC 81 MG tablet Take 81 mg by mouth daily.    atorvastatin (LIPITOR) 20 MG tablet Take 1 tablet (20 mg total) by mouth every evening. Qty: 30 tablet, Refills: 0    digoxin (LANOXIN) 0.25 MG tablet Take 1 tablet (0.25 mg total) by mouth daily. Qty: 30 tablet, Refills: 2    escitalopram (LEXAPRO) 20 MG tablet Take 1 tablet (20 mg total) by mouth daily. Qty: 30 tablet, Refills: 0    !! insulin aspart (NOVOLOG) 100 UNIT/ML injection Inject 10-12 Units into the skin 3 (three) times daily with meals.    insulin glargine (LANTUS) 100 UNIT/ML injection Inject 30 Units into the skin at bedtime.    metolazone (ZAROXOLYN) 5 MG tablet Take 5 mg by mouth daily as needed. pt. takes 1 tablet daily only if needed for 4-5 lbs weight gain, Pt. Is to take with morning lasix and 1 potassium tablet.    oxyCODONE (OXY IR/ROXICODONE) 5 MG immediate release tablet Take 5-10 mg by mouth every 4 (four) hours as needed for moderate pain or severe pain.    potassium chloride SA (K-DUR,KLOR-CON) 20 MEQ tablet Take 1 tablet (20 mEq total) by mouth 2 (two) times daily.    zolpidem (AMBIEN) 5 MG tablet Take 1 tablet (5 mg total) by mouth at bedtime as needed for sleep. Qty: 30 tablet, Refills: 0     !! - Potential duplicate medications found. Please discuss with provider.    STOP taking these medications     furosemide (LASIX) 40 MG tablet          Aspirin prescribed at discharge? Yes Beta Blocker Prescribed? Yes For EF 45% or less, Was ACEI/ARB Prescribed?Yes ADP Receptor Inhibitor Prescribed? No For EF <40%, Aldosterone  Inhibitor Prescribed? Yes Was EF assessed during THIS hospitalization? Yes   Outstanding Labs/Studies   None  Duration of Discharge Encounter   Greater than 45 minutes including physician time.  Signed, Jory Sims  NP 02/14/2016, 1:53 PM

## 2016-02-14 NOTE — Plan of Care (Signed)
Problem: Food- and Nutrition-Related Knowledge Deficit (NB-1.1) Goal: Nutrition education Formal process to instruct or train a patient/client in a skill or to impart knowledge to help patients/clients voluntarily manage or modify food choices and eating behavior to maintain or improve health. Outcome: Adequate for Discharge Nutrition Education Note  RD consulted for nutrition education regarding  onset CHF.  RD provided "Low Sodium Nutrition Therapy" handout from the Academy of [employer]etics. Reviewed patient's dietary recall. Provided examples on ways to decrease sodium intake in diet. Discouraged intake of processed foods and use of salt shaker. Encouraged fresh fruits and vegetables as well as whole grain sources of carbohydrates to maximize fiber intake.   RD discussed why it is important for patient to adhere to diet recommendations, and emphasized the role of fluids, foods to avoid, and importance of weighing self daily. Teach back method used.  Expect good compliance.  Body mass index is 32.36 kg/(m^2). Pt meets criteria for obese based on current BMI.  Current diet order is heart healthy, patient is consuming approximately 75-100% meals at this time. Labs and medications reviewed. No further nutrition interventions warranted at this time. RD contact information provided. If additional nutrition issues arise, please re-consult RD.   Colman Cater MS,RD,CSG,LDN Office: [phone] Pager: [phone]

## 2016-02-16 ENCOUNTER — Telehealth (HOSPITAL_COMMUNITY): Payer: Self-pay | Admitting: Surgery

## 2016-02-16 NOTE — Telephone Encounter (Signed)
Ronald Miller called me after receiving a call from an inpatient nurse regarding his "medications".   He tells me that he was able to get all medications and we reviewed each of them from my list and his discharge information.  I informed him that a pharmacist will also review his medications at that time.  He does say that he is almost out of his inhaler and was sent home a prescription for a bottle of insulin.  He uses an insulin Pen.  I have asked him to follow-up with his PCP regarding both of those issues.  He had no post- discharge follow-up scheduled therefore I scheduled an appt for this Thursday in the AHF Clinic.  I reminded him to call me back with any concerns or questions related to his HF.

## 2016-02-19 ENCOUNTER — Encounter (HOSPITAL_COMMUNITY): Payer: Self-pay | Admitting: Internal Medicine

## 2016-02-19 ENCOUNTER — Ambulatory Visit (HOSPITAL_COMMUNITY)
Admission: RE | Admit: 2016-02-19 | Discharge: 2016-02-19 | Disposition: A | Discharge status: Home / Self Care | Payer: Medicare HMO | Source: Ambulatory Visit | Attending: Internal Medicine | Admitting: Internal Medicine

## 2016-02-19 VITALS — BP 108/66 | HR 91 | Wt 224.5 lb

## 2016-02-19 DIAGNOSIS — Z79899 Other long term (current) drug therapy: Secondary | ICD-10-CM | POA: Insufficient documentation

## 2016-02-19 DIAGNOSIS — I5023 Acute on chronic systolic (congestive) heart failure: Secondary | ICD-10-CM | POA: Diagnosis not present

## 2016-02-19 DIAGNOSIS — Z8249 Family history of ischemic heart disease and other diseases of the circulatory system: Secondary | ICD-10-CM | POA: Insufficient documentation

## 2016-02-19 DIAGNOSIS — I2510 Atherosclerotic heart disease of native coronary artery without angina pectoris: Secondary | ICD-10-CM | POA: Diagnosis not present

## 2016-02-19 DIAGNOSIS — I5022 Chronic systolic (congestive) heart failure: Secondary | ICD-10-CM | POA: Diagnosis not present

## 2016-02-19 DIAGNOSIS — Z95810 Presence of automatic (implantable) cardiac defibrillator: Secondary | ICD-10-CM | POA: Diagnosis not present

## 2016-02-19 DIAGNOSIS — Z833 Family history of diabetes mellitus: Secondary | ICD-10-CM | POA: Insufficient documentation

## 2016-02-19 DIAGNOSIS — K760 Fatty (change of) liver, not elsewhere classified: Secondary | ICD-10-CM | POA: Insufficient documentation

## 2016-02-19 DIAGNOSIS — I110 Hypertensive heart disease with heart failure: Secondary | ICD-10-CM | POA: Insufficient documentation

## 2016-02-19 DIAGNOSIS — Z951 Presence of aortocoronary bypass graft: Secondary | ICD-10-CM | POA: Diagnosis not present

## 2016-02-19 DIAGNOSIS — E119 Type 2 diabetes mellitus without complications: Secondary | ICD-10-CM | POA: Insufficient documentation

## 2016-02-19 DIAGNOSIS — Z7982 Long term (current) use of aspirin: Secondary | ICD-10-CM | POA: Insufficient documentation

## 2016-02-19 DIAGNOSIS — I428 Other cardiomyopathies: Secondary | ICD-10-CM | POA: Diagnosis not present

## 2016-02-19 DIAGNOSIS — F329 Major depressive disorder, single episode, unspecified: Secondary | ICD-10-CM | POA: Insufficient documentation

## 2016-02-19 DIAGNOSIS — R188 Other ascites: Secondary | ICD-10-CM | POA: Insufficient documentation

## 2016-02-19 DIAGNOSIS — J449 Chronic obstructive pulmonary disease, unspecified: Secondary | ICD-10-CM | POA: Diagnosis not present

## 2016-02-19 DIAGNOSIS — Z794 Long term (current) use of insulin: Secondary | ICD-10-CM | POA: Diagnosis not present

## 2016-02-19 DIAGNOSIS — Z885 Allergy status to narcotic agent status: Secondary | ICD-10-CM | POA: Diagnosis not present

## 2016-02-19 DIAGNOSIS — Z87891 Personal history of nicotine dependence: Secondary | ICD-10-CM | POA: Diagnosis not present

## 2016-02-19 DIAGNOSIS — E1169 Type 2 diabetes mellitus with other specified complication: Secondary | ICD-10-CM

## 2016-02-19 LAB — COMPREHENSIVE METABOLIC PANEL
ALBUMIN: 3.8 g/dL (ref 3.5–5.0)
ALT: 16 U/L — ABNORMAL LOW (ref 17–63)
ANION GAP: 11 (ref 5–15)
AST: 25 U/L (ref 15–41)
Alkaline Phosphatase: 89 U/L (ref 38–126)
BUN: 26 mg/dL — ABNORMAL HIGH (ref 6–20)
CO2: 29 mmol/L (ref 22–32)
Calcium: 9.1 mg/dL (ref 8.9–10.3)
Chloride: 97 mmol/L — ABNORMAL LOW (ref 101–111)
Creatinine, Ser: 1.2 mg/dL (ref 0.61–1.24)
GFR calc Af Amer: >60 mL/min (ref >60)
GFR calc non Af Amer: >60 mL/min (ref >60)
GLUCOSE: 143 mg/dL — AB (ref 65–99)
POTASSIUM: 4.2 mmol/L (ref 3.5–5.1)
SODIUM: 137 mmol/L (ref 135–145)
Total Bilirubin: 0.9 mg/dL (ref 0.3–1.2)
Total Protein: 7.4 g/dL (ref 6.5–8.1)

## 2016-02-19 LAB — DIGOXIN LEVEL: Digoxin Level: 0.9 ng/mL (ref 0.8–2.0)

## 2016-02-19 MED ORDER — TORSEMIDE 20 MG PO TABS: 20.0000 mg | ORAL_TABLET | Freq: Two times a day (BID) | ORAL | Status: DC

## 2016-02-19 MED ORDER — LOSARTAN POTASSIUM 25 MG PO TABS: 25.0000 mg | ORAL_TABLET | Freq: Two times a day (BID) | ORAL | Status: DC

## 2016-02-19 MED ORDER — SPIRONOLACTONE 25 MG PO TABS: 12.5000 mg | ORAL_TABLET | Freq: Every day | ORAL | Status: DC

## 2016-02-19 MED ORDER — POTASSIUM CHLORIDE CRYS ER 20 MEQ PO TBCR: 20.0000 meq | EXTENDED_RELEASE_TABLET | Freq: Two times a day (BID) | ORAL | Status: DC

## 2016-02-19 MED ORDER — DIGOXIN 250 MCG PO TABS: 0.2500 mg | ORAL_TABLET | Freq: Every day | ORAL | Status: DC

## 2016-02-19 NOTE — Patient Instructions (Signed)
Increase Losartan to 25 mg Twice daily   Labs today  You have been referred to Cardiac Rehab at [facility], they will contact you to schedule  You have been referred to 1 month

## 2016-02-19 NOTE — Addendum Note (Signed)
Encounter addended by: Scarlette Calico, RN on: 02/19/2016 11:24 AM<BR>     Documentation filed: Dx Association, Patient Instructions Section, Orders

## 2016-02-19 NOTE — Progress Notes (Signed)
Patient ID: Ronald Betke., male   DOB: August 08, 1956, 60 y.o.   MRN: [id number] Patient ID: Ronald Gatchell., male   DOB: 1956-06-01, 60 y.o.   MRN: [id number]    Advanced Heart Failure Clinic Note   Referring Physician: Darylene Price, FNP Primary Care: [facility] Primary Cardiologist:   HPI:   Ronald Lenney. is a 60 y.o. male with history of chronic systolic HF s/p Medtronic ICD 2014, CAD s/p CABG in 2000, HTN, Hx of cocaine abuse, Tobacco abuse, Depression, and COPD.   Echo 10/14/15 LVEF 20-25%, RV mildly dilated.   Admitted to [facility] from 4/24 - [id number] with A/C systolic HF.  Had a US guided Paracentesis with 5 L removed. They decreased his home lasix to 40 mg daily due to low blood pressure. BB held as well with bradycardia and hypotension.  Discharge weight 239 lbs  Admitted 5/17 with volume overload and CP. Echo 02/10/16 LVEF 15%, Mild MR, Mod LAE, Severe RAE, severely reduced RV function, PA peak pressure 31 mm Hg. Underwent paracentesis 02/10/16 with 5.2 L out. Ultrasound showed severe fatty liver. Had [facility] yesterday with severe native 3v CAD, All grafts occluded except LIMA to LAD with faint L to R collaterals, elevated filling pressures with normal cardiac output. No targets for revascularization. Weight on d/c 247 -> 219 pounds.   He presents for post-hospital f/u. Doing much better. Working hard on his diet watching salt and calories. Weighing daily 224-225. Now able to help his sister with housework. No CP, orthopnea or PND. No problems with meds.    R/LHC 02/12/16  Prox RCA lesion, 100% stenosed.  Prox Cx to Dist Cx lesion, 95% stenosed.  1st Mrg lesion, 100% stenosed.  2nd Mrg lesion, 100% stenosed.  Prox LAD lesion, 100% stenosed.  Ost LM to LM lesion, 50% stenosed.  Origin lesion, 100% stenosed.  Origin lesion, 100% stenosed.  Origin lesion, 100% stenosed.  Findings: Ao = 82/58 (69) LV = 84/15/27 RA = 14 RV = 45/11/17 PA = 49/24  (31) PCW = 28 Fick cardiac output/index = 5.3/2.4 PVR = 0.6 WU SVR = 837 FA sat = 98% PA sat = 63%, 62%    Past Medical History  Diagnosis Date  . ASCVD (arteriosclerotic cardiovascular disease)   . Diabetes mellitus   . Hypertension   . History of cocaine abuse   . Tobacco abuse   . Depression   . Suicidal ideation   . Chronic systolic CHF (congestive heart failure) ([place])   . COPD (chronic obstructive pulmonary disease) ([place])   . Coronary artery disease   . AICD (automatic cardioverter/defibrillator) present   . Shortness of breath dyspnea     Current Outpatient Prescriptions  Medication Sig Dispense Refill  . acetaminophen (TYLENOL) 500 MG tablet Take 1,000 mg by mouth every 6 (six) hours as needed for mild pain or headache.    . albuterol (PROVENTIL HFA;VENTOLIN HFA) 108 (90 BASE) MCG/ACT inhaler Inhale 2 puffs into the lungs every 6 (six) hours as needed for wheezing or shortness of breath.    Marland Kitchen aspirin EC 81 MG tablet Take 81 mg by mouth daily.    Marland Kitchen atorvastatin (LIPITOR) 20 MG tablet Take 1 tablet (20 mg total) by mouth every evening. 30 tablet 0  . digoxin (LANOXIN) 0.25 MG tablet Take 1 tablet (0.25 mg total) by mouth daily. 30 tablet 2  . escitalopram (LEXAPRO) 20 MG tablet Take 1 tablet (20 mg total) by mouth daily.  30 tablet 0  . insulin aspart (NOVOLOG) 100 UNIT/ML injection Inject 10-12 Units into the skin 3 (three) times daily with meals.    . insulin glargine (LANTUS) 100 UNIT/ML injection Inject 30 Units into the skin at bedtime.    Marland Kitchen losartan (COZAAR) 25 MG tablet Take 1 tablet (25 mg total) by mouth daily. 30 tablet 6  . oxyCODONE (OXY IR/ROXICODONE) 5 MG immediate release tablet Take 5-10 mg by mouth every 4 (four) hours as needed for moderate pain or severe pain.    . potassium chloride SA (K-DUR,KLOR-CON) 20 MEQ tablet Take 1 tablet (20 mEq total) by mouth 2 (two) times daily.    Marland Kitchen spironolactone (ALDACTONE) 25 MG tablet Take 0.5 tablets (12.5 mg total) by  mouth daily. 30 tablet 10  . torsemide (DEMADEX) 20 MG tablet Take 1 tablet (20 mg total) by mouth 2 (two) times daily. 60 tablet 6  . zolpidem (AMBIEN) 5 MG tablet Take 1 tablet (5 mg total) by mouth at bedtime as needed for sleep. 30 tablet 0  . metolazone (ZAROXOLYN) 5 MG tablet Take 5 mg by mouth daily as needed. Reported on 02/19/2016     No current facility-administered medications for this encounter.    Allergies  Allergen Reactions  . Codeine Nausea And Vomiting      Social History   Social History  . Marital Status: Single    Spouse Name: N/A  . Number of Children: N/A  . Years of Education: N/A   Occupational History  . Retired   . Truck driver    Social History Main Topics  . Smoking status: Former Smoker -- 1.50 packs/day for 23 years    Types: Cigarettes    Quit date: 08/27/2015  . Smokeless tobacco: Never Used  . Alcohol Use: No  . Drug Use: No     Comment: Former  . Sexual Activity: Not on file   Other Topics Concern  . Not on file   Social History Narrative   Divorced with one child   No regular exercise      Family History  Problem Relation Age of Onset  . CAD Father   . Diabetes Father   . Heart failure Father     Filed Vitals:   02/19/16 1025  BP: 108/66  Pulse: 91  Weight: 224 lb 8 oz (101.833 kg)  SpO2: 97%    PHYSICAL EXAM: General:  Looks great  No respiratory difficulty HEENT: normal Neck: supple. JVD 6-7 Carotids 2+ bilat; no bruits. No lymphadenopathy or thyromegaly appreciated. Cor: PMI nondisplaced. RRR. No rubs, gallops or murmurs. Lungs: clear Abdomen: Obese, NT/ND. No appreciable hepatosplenomegaly. No bruits or masses. +BS Extremities: no cyanosis, clubbing. Tr edema. Chronic venous stasis changes with healing wound on R shin  Neuro: alert & oriented x 3, cranial nerves grossly intact. moves all 4 extremities w/o difficulty. Affect pleasant.   ASSESSMENT & PLAN:  1. Acute on chronic systolic HF with severe  biventricular dysfunction EF 15% due to NICM --Volume status much improved.  --NYHA II-III now --Will increase losartan to 25 bid --Continue digoxin and spironolactone --Will start carvedilol 3.125 bid at next visit --I am very impressed with the changes he has made. These were life-saving for him. Still a long way to go. Will refer to cardiac rehab in [place] ([facility]) --Check labs today including digoxin level 2. CAD --severe 3v- CAD s/p CABG with occluded grafts as above except for LIMA. No targets for revascularization --Continue ASA, statin  3. HTN --controlled 4. DMII --followed PCP. Stressed need to get under controlled 5. Fatty liver with ascites -- continue with diet and weight loss. Needs to get DM2 under control   Onur Mori, Quillian Quince, MD

## 2016-02-23 ENCOUNTER — Telehealth (HOSPITAL_COMMUNITY): Payer: Self-pay | Admitting: Surgery

## 2016-02-23 ENCOUNTER — Encounter (HOSPITAL_COMMUNITY): Payer: Self-pay | Admitting: *Deleted

## 2016-02-23 ENCOUNTER — Inpatient Hospital Stay (HOSPITAL_COMMUNITY)
Admission: EM | Admit: 2016-02-23 | Discharge: 2016-02-25 | DRG: 292 | Disposition: A | Discharge status: Home / Self Care | Payer: Medicare HMO | Attending: Internal Medicine | Admitting: Internal Medicine

## 2016-02-23 ENCOUNTER — Emergency Department (HOSPITAL_COMMUNITY): Payer: Medicare HMO

## 2016-02-23 DIAGNOSIS — Z7982 Long term (current) use of aspirin: Secondary | ICD-10-CM | POA: Diagnosis not present

## 2016-02-23 DIAGNOSIS — Z9119 Patient's noncompliance with other medical treatment and regimen: Secondary | ICD-10-CM | POA: Diagnosis not present

## 2016-02-23 DIAGNOSIS — Z951 Presence of aortocoronary bypass graft: Secondary | ICD-10-CM | POA: Diagnosis not present

## 2016-02-23 DIAGNOSIS — Z79899 Other long term (current) drug therapy: Secondary | ICD-10-CM

## 2016-02-23 DIAGNOSIS — I5021 Acute systolic (congestive) heart failure: Secondary | ICD-10-CM | POA: Diagnosis not present

## 2016-02-23 DIAGNOSIS — Z794 Long term (current) use of insulin: Secondary | ICD-10-CM | POA: Diagnosis not present

## 2016-02-23 DIAGNOSIS — R45851 Suicidal ideations: Secondary | ICD-10-CM | POA: Diagnosis present

## 2016-02-23 DIAGNOSIS — Z87891 Personal history of nicotine dependence: Secondary | ICD-10-CM | POA: Diagnosis not present

## 2016-02-23 DIAGNOSIS — I110 Hypertensive heart disease with heart failure: Secondary | ICD-10-CM | POA: Diagnosis present

## 2016-02-23 DIAGNOSIS — Z833 Family history of diabetes mellitus: Secondary | ICD-10-CM

## 2016-02-23 DIAGNOSIS — I4510 Unspecified right bundle-branch block: Secondary | ICD-10-CM | POA: Diagnosis present

## 2016-02-23 DIAGNOSIS — Z8249 Family history of ischemic heart disease and other diseases of the circulatory system: Secondary | ICD-10-CM | POA: Diagnosis not present

## 2016-02-23 DIAGNOSIS — R188 Other ascites: Secondary | ICD-10-CM | POA: Diagnosis present

## 2016-02-23 DIAGNOSIS — I959 Hypotension, unspecified: Secondary | ICD-10-CM | POA: Diagnosis present

## 2016-02-23 DIAGNOSIS — E119 Type 2 diabetes mellitus without complications: Secondary | ICD-10-CM | POA: Diagnosis present

## 2016-02-23 DIAGNOSIS — J449 Chronic obstructive pulmonary disease, unspecified: Secondary | ICD-10-CM | POA: Diagnosis present

## 2016-02-23 DIAGNOSIS — R001 Bradycardia, unspecified: Secondary | ICD-10-CM | POA: Diagnosis present

## 2016-02-23 DIAGNOSIS — I255 Ischemic cardiomyopathy: Secondary | ICD-10-CM | POA: Diagnosis present

## 2016-02-23 DIAGNOSIS — I5043 Acute on chronic combined systolic (congestive) and diastolic (congestive) heart failure: Secondary | ICD-10-CM | POA: Diagnosis not present

## 2016-02-23 DIAGNOSIS — R110 Nausea: Secondary | ICD-10-CM

## 2016-02-23 DIAGNOSIS — K760 Fatty (change of) liver, not elsewhere classified: Secondary | ICD-10-CM | POA: Diagnosis present

## 2016-02-23 DIAGNOSIS — I2510 Atherosclerotic heart disease of native coronary artery without angina pectoris: Secondary | ICD-10-CM | POA: Diagnosis present

## 2016-02-23 DIAGNOSIS — I509 Heart failure, unspecified: Secondary | ICD-10-CM

## 2016-02-23 DIAGNOSIS — Z886 Allergy status to analgesic agent status: Secondary | ICD-10-CM | POA: Diagnosis not present

## 2016-02-23 DIAGNOSIS — Z95810 Presence of automatic (implantable) cardiac defibrillator: Secondary | ICD-10-CM | POA: Diagnosis not present

## 2016-02-23 LAB — BRAIN NATRIURETIC PEPTIDE: B NATRIURETIC PEPTIDE 5: 1372.7 pg/mL — AB (ref 0.0–100.0)

## 2016-02-23 LAB — BASIC METABOLIC PANEL
ANION GAP: 9 (ref 5–15)
BUN: 26 mg/dL — ABNORMAL HIGH (ref 6–20)
CALCIUM: 9.2 mg/dL (ref 8.9–10.3)
CHLORIDE: 104 mmol/L (ref 101–111)
CO2: 23 mmol/L (ref 22–32)
Creatinine, Ser: 0.96 mg/dL (ref 0.61–1.24)
GFR calc non Af Amer: >60 mL/min (ref >60)
GLUCOSE: 199 mg/dL — AB (ref 65–99)
POTASSIUM: 4.2 mmol/L (ref 3.5–5.1)
Sodium: 136 mmol/L (ref 135–145)

## 2016-02-23 LAB — CBC
HEMATOCRIT: 37.7 % — AB (ref 39.0–52.0)
HEMOGLOBIN: 12 g/dL — AB (ref 13.0–17.0)
MCH: 28.5 pg (ref 26.0–34.0)
MCHC: 31.8 g/dL (ref 30.0–36.0)
MCV: 89.5 fL (ref 78.0–100.0)
Platelets: 159 10*3/uL (ref 150–400)
RBC: 4.21 MIL/uL — AB (ref 4.22–5.81)
RDW: 15.7 % — ABNORMAL HIGH (ref 11.5–15.5)
WBC: 7.6 10*3/uL (ref 4.0–10.5)

## 2016-02-23 LAB — I-STAT TROPONIN, ED: TROPONIN I, POC: 0.1 ng/mL — AB (ref 0.00–0.08)

## 2016-02-23 LAB — DIGOXIN LEVEL: Digoxin Level: 0.8 ng/mL (ref 0.8–2.0)

## 2016-02-23 MED ORDER — FUROSEMIDE 10 MG/ML IJ SOLN
40.0000 mg | INTRAMUSCULAR | Status: AC
  Administered 2016-02-23: 40 mg via INTRAVENOUS
  Filled 2016-02-23: qty 4

## 2016-02-23 MED ORDER — ONDANSETRON 4 MG PO TBDP
ORAL_TABLET | ORAL | Status: AC
Start: 2016-02-23 — End: 2016-02-24
  Filled 2016-02-23: qty 1

## 2016-02-23 MED ORDER — ONDANSETRON 4 MG PO TBDP
ORAL_TABLET | ORAL | Status: AC
  Filled 2016-02-23: qty 1

## 2016-02-23 MED ORDER — SODIUM CHLORIDE 0.9 % IV BOLUS (SEPSIS)
1000.0000 mL | Freq: Once | INTRAVENOUS | Status: AC
  Administered 2016-02-23: 1000 mL via INTRAVENOUS

## 2016-02-23 MED ORDER — ONDANSETRON 4 MG PO TBDP
4.0000 mg | ORAL_TABLET | Freq: Once | ORAL | Status: AC
  Administered 2016-02-23: 4 mg via ORAL

## 2016-02-23 MED ORDER — METOCLOPRAMIDE HCL 5 MG/ML IJ SOLN
10.0000 mg | Freq: Once | INTRAMUSCULAR | Status: AC
  Administered 2016-02-23: 10 mg via INTRAVENOUS
  Filled 2016-02-23: qty 2

## 2016-02-23 NOTE — ED Notes (Signed)
Attempted to call report

## 2016-02-23 NOTE — ED Notes (Signed)
RN found pt lying w/ his head at the foot of the bed w/ his feet elevated at the head of the bed.  Pt reports he found a position that is comfortable and he is not nauseas in this position.

## 2016-02-23 NOTE — ED Notes (Signed)
TRIPONIN I  0.10

## 2016-02-23 NOTE — ED Provider Notes (Signed)
CSN: [id number]     Arrival date & time 02/23/16  1820 History   First MD Initiated Contact with Patient 02/23/16 1918     Chief Complaint  Patient presents with  . Chest Pain  . Back Pain     (Consider location/radiation/quality/duration/timing/severity/associated sxs/prior Treatment) HPI Comments: The patient is a 60 year old male, he has a known history of coronary disease status post bypass grafting greater than 15 years ago. He has congestive heart failure with a very low ejection fraction, this is an ischemic cardiomyopathy. The patient presents to the hospital today with increasing chest pain and shortness of breath which occurred since early this morning. It seems to been getting progressively worse throughout the day, located in the mid to upper chest. He reports the pain between his shoulder blades. He is very nauseated, pale and sweaty on arrival. Blood work obtained while the patient was in the waiting room showed a borderline elevated troponin, his EKG showed a right bundle branch block pattern, symptoms are persistent. He received a Zofran tablet in the waiting room, this helped for a while but then became violently nauseated and diaphoretic on my initial exam.  Patient is a 60 y.o. male presenting with chest pain and back pain. The history is provided by the patient and medical records.  Chest Pain Associated symptoms: back pain   Back Pain Associated symptoms: chest pain     Past Medical History  Diagnosis Date  . ASCVD (arteriosclerotic cardiovascular disease)   . Diabetes mellitus   . Hypertension   . History of cocaine abuse   . Tobacco abuse   . Depression   . Suicidal ideation   . Chronic systolic CHF (congestive heart failure) ([place])   . COPD (chronic obstructive pulmonary disease) ([place])   . Coronary artery disease   . AICD (automatic cardioverter/defibrillator) present   . Shortness of breath dyspnea    Past Surgical History  Procedure Laterality Date  .  Coronary artery bypass graft  2001  . Lumbar disc surgery  02/1999    Discectomy and fusion  . Vasectomy      Subsequent reversal  . Insert / replace / remove pacemaker    . Cardiac defibrillator placement    . Cholecystectomy N/A 08/30/2015    Procedure: LAPAROSCOPIC CHOLECYSTECTOMY;  Surgeon: Hubbard Robinson, MD;  Location: [name];  Service: General;  Laterality: N/A;  . Cardiac defibrillator placement    . Cardiac catheterization N/A 02/12/2016    Procedure: Right/Left Heart Cath and Coronary/Graft Angiography;  Surgeon: Jolaine Artist, MD;  Location: [facility];  Service: Cardiovascular;  Laterality: N/A;   Family History  Problem Relation Age of Onset  . CAD Father   . Diabetes Father   . Heart failure Father    Social History  Substance Use Topics  . Smoking status: Former Smoker -- 1.50 packs/day for 23 years    Types: Cigarettes    Quit date: 08/27/2015  . Smokeless tobacco: Never Used  . Alcohol Use: No    Review of Systems  Cardiovascular: Positive for chest pain.  Musculoskeletal: Positive for back pain.  All other systems reviewed and are negative.     Allergies  Codeine  Home Medications   Prior to Admission medications   Medication Sig Start Date End Date Taking? Authorizing Provider  acetaminophen (TYLENOL) 500 MG tablet Take 1,000 mg by mouth every 6 (six) hours as needed for mild pain or headache.   Yes Historical Provider, MD  albuterol (PROVENTIL HFA;VENTOLIN HFA) 108 (90 BASE) MCG/ACT inhaler Inhale 2 puffs into the lungs every 6 (six) hours as needed for wheezing or shortness of breath.   Yes Historical Provider, MD  aspirin EC 81 MG tablet Take 81 mg by mouth daily.   Yes Historical Provider, MD  atorvastatin (LIPITOR) 20 MG tablet Take 1 tablet (20 mg total) by mouth every evening. 01/18/16  Yes Lytle Butte, MD  baclofen (LIORESAL) 10 MG tablet Take 10 mg by mouth 3 (three) times daily as needed for muscle spasms.   Yes Historical  Provider, MD  digoxin (LANOXIN) 0.25 MG tablet Take 1 tablet (0.25 mg total) by mouth daily. 02/19/16  Yes Shaune Pascal Bensimhon, MD  escitalopram (LEXAPRO) 20 MG tablet Take 1 tablet (20 mg total) by mouth daily. 09/02/15  Yes Vipul Manuella Ghazi, MD  insulin aspart (NOVOLOG) 100 UNIT/ML injection Inject 0-15 Units into the skin 3 (three) times daily with meals. SLIDING SCALE   Yes Historical Provider, MD  insulin glargine (LANTUS) 100 UNIT/ML injection Inject 40 Units into the skin at bedtime.    Yes Historical Provider, MD  losartan (COZAAR) 25 MG tablet Take 1 tablet (25 mg total) by mouth 2 (two) times daily. 02/19/16  Yes Jolaine Artist, MD  metolazone (ZAROXOLYN) 5 MG tablet Take 5 mg by mouth daily as needed. Reported on 02/19/2016   Yes Historical Provider, MD  potassium chloride SA (K-DUR,KLOR-CON) 20 MEQ tablet Take 1 tablet (20 mEq total) by mouth 2 (two) times daily. 02/19/16  Yes Jolaine Artist, MD  spironolactone (ALDACTONE) 25 MG tablet Take 0.5 tablets (12.5 mg total) by mouth daily. 02/19/16  Yes Jolaine Artist, MD  torsemide (DEMADEX) 20 MG tablet Take 1 tablet (20 mg total) by mouth 2 (two) times daily. 02/19/16  Yes Jolaine Artist, MD  zolpidem (AMBIEN) 5 MG tablet Take 1 tablet (5 mg total) by mouth at bedtime as needed for sleep. 01/18/16  Yes Lytle Butte, MD   BP 103/83 mmHg  Pulse 88  Temp(Src) 97.6 F (36.4 C) (Oral)  Resp 15  SpO2 95% Physical Exam  Constitutional: He appears well-developed and well-nourished. He appears distressed.  HENT:  Head: Normocephalic and atraumatic.  Mouth/Throat: Oropharynx is clear and moist. No oropharyngeal exudate.  Eyes: Conjunctivae and EOM are normal. Pupils are equal, round, and reactive to light. Right eye exhibits no discharge. Left eye exhibits no discharge. No scleral icterus.  Neck: Normal range of motion. Neck supple. No JVD present. No thyromegaly present.  Cardiovascular: Normal rate, regular rhythm, normal heart sounds  and intact distal pulses.  Exam reveals no gallop and no friction rub.   No murmur heard. Pulmonary/Chest: Effort normal and breath sounds normal. No respiratory distress. He has no wheezes. He has no rales.  Pacemaker palpable in the left upper chest wall, no overlying redness or induration  Abdominal: Soft. Bowel sounds are normal. He exhibits no distension and no mass. There is no tenderness.  Musculoskeletal: Normal range of motion. He exhibits edema (1+ symmetrical pitting edema of the lower extremities). He exhibits no tenderness.  Lymphadenopathy:    He has no cervical adenopathy.  Neurological: He is alert. Coordination normal.  Skin: Skin is warm. No rash noted. He is diaphoretic. No erythema.  Psychiatric: He has a normal mood and affect. His behavior is normal.  Nursing note and vitals reviewed.   ED Course  Procedures (including critical care time) Labs Review Labs Reviewed  BASIC METABOLIC PANEL -  Abnormal; Notable for the following:    Glucose, Bld 199 (*)    BUN 26 (*)    All other components within normal limits  CBC - Abnormal; Notable for the following:    RBC 4.21 (*)    Hemoglobin 12.0 (*)    HCT 37.7 (*)    RDW 15.7 (*)    All other components within normal limits  BRAIN NATRIURETIC PEPTIDE - Abnormal; Notable for the following:    B Natriuretic Peptide 1372.7 (*)    All other components within normal limits  I-STAT TROPOININ, ED - Abnormal; Notable for the following:    Troponin i, poc 0.10 (*)    All other components within normal limits  DIGOXIN LEVEL    Imaging Review Dg Chest 2 View  02/23/2016  CLINICAL DATA:  Acute onset of shortness of breath and mid chest pain. Back pain. Initial encounter. EXAM: CHEST  2 VIEW COMPARISON:  Chest radiograph performed 02/09/2016 FINDINGS: The lungs are well-aerated. Mild vascular congestion is noted. There is no evidence of focal opacification, pleural effusion or pneumothorax. The heart is mildly enlarged. The  patient is status post median sternotomy. A pacemaker/AICD is noted at the left chest wall, with leads ending at the right atrium, right ventricle and coronary sinus. No acute osseous abnormalities are seen. IMPRESSION: Mild cardiomegaly. Mild vascular congestion, without significant pulmonary edema. Electronically Signed   By: Garald Balding M.D.   On: 02/23/2016 19:06   I have personally reviewed and evaluated these images and lab results as part of my medical decision-making.   EKG Interpretation   Date/Time:  Monday Feb 23 2016 18:26:48 EDT Ventricular Rate:  99 PR Interval:  176 QRS Duration: 110 QT Interval:  384 QTC Calculation: 492 R Axis:   -97 Text Interpretation:  Sinus rhythm with occasional Premature ventricular  complexes Possible Left atrial enlargement Incomplete right bundle branch  block Right ventricular hypertrophy Inferior infarct , age undetermined  Anterolateral infarct , age undetermined Abnormal ECG since last tracing  no significant change Confirmed by Sabra Heck  MD, Oriana Horiuchi ([id number]) on 02/23/2016  7:19:21 PM      EKG Interpretation  Date/Time:  Monday Feb 23 2016 19:35:20 EDT Ventricular Rate:  93 PR Interval:  162 QRS Duration: 136 QT Interval:  377 QTC Calculation: 469 R Axis:   -97 Text Interpretation:  Sinus rhythm Probable left atrial enlargement Right bundle branch block Inferior infarct, old since last tracing no significant change Confirmed by Ariya Bohannon  MD, Aydan Levitz ([id number]) on 02/23/2016 8:26:05 PM        MDM   Final diagnoses:  Acute systolic congestive heart failure ([place])    The patient appears critically ill, he is diaphoretic, nauseated, ill-appearing, his pulses have become slightly weak. I will open a bag of fluid, he will need aspirin, EKG repeated and shows right bundle branch block continuing, his troponin was borderline elevated and at this point given his critical presentation would suggest an ischemic etiology. Zofran was repeated by  sublingual route and I placed a peripheral IV personally in the left wrist on the radial surface.  CRITICAL CARE Performed by: Johnna Acosta Total critical care time: 35 minutes Critical care time was exclusive of separately billable procedures and treating other patients. Critical care was necessary to treat or prevent imminent or life-threatening deterioration. Critical care was time spent personally by me on the following activities: development of treatment plan with patient and/or surrogate as well as nursing, discussions with consultants, evaluation of  patient's response to treatment, examination of patient, obtaining history from patient or surrogate, ordering and performing treatments and interventions, ordering and review of laboratory studies, ordering and review of radiographic studies, pulse oximetry and re-evaluation of patient's condition.   At 8:30 PM I discussed care with Dr. Philbert Riser of the cardiology service who recommends inpatient admission to hospitalist service for rule out. He states that given that the patient has no objective findings of ischemia that anticoagulation is not necessary, he does recommend diuresis.  Discussed with hospitalist, defers to cardiology, rediscussed with cardiology, they will admit.  Noemi Chapel, MD 02/23/16 2213

## 2016-02-23 NOTE — ED Notes (Signed)
Pt reports sob and mid chest pain and back pain that woke him up at 0200. Pt reports feeling sick to stomach. ekg done at triage.

## 2016-02-23 NOTE — ED Notes (Signed)
DR MILLER INFORMED OF  POS TRI

## 2016-02-23 NOTE — Telephone Encounter (Signed)
Ronald Miller called and said he is not feeling well.   He has gained 5 pounds from 224 to 229lbs overnight.  He feels that his stomach is "getting tight" and is hurting between his shoulder blades and across his chest (different than before).  I have asked him to take a Metolazone today as ordered and call back tomorrow if weight not down and stomach less tight.  He says that he will proceed to the ED if his shoulder/chest pain gets much worse.  I made Kevan Rosebush (clinic coordinator) aware.

## 2016-02-24 ENCOUNTER — Encounter (HOSPITAL_COMMUNITY): Payer: Self-pay | Admitting: Urology

## 2016-02-24 ENCOUNTER — Inpatient Hospital Stay (HOSPITAL_COMMUNITY): Payer: Medicare HMO

## 2016-02-24 DIAGNOSIS — I5043 Acute on chronic combined systolic (congestive) and diastolic (congestive) heart failure: Secondary | ICD-10-CM | POA: Diagnosis present

## 2016-02-24 LAB — TROPONIN I
Troponin I: 0.08 ng/mL — ABNORMAL HIGH (ref <0.031)
Troponin I: 0.07 ng/mL — ABNORMAL HIGH (ref <0.031)
Troponin I: 0.07 ng/mL — ABNORMAL HIGH (ref <0.031)

## 2016-02-24 LAB — GLUCOSE, CAPILLARY
GLUCOSE-CAPILLARY: 214 mg/dL — AB (ref 65–99)
Glucose-Capillary: 183 mg/dL — ABNORMAL HIGH (ref 65–99)
Glucose-Capillary: 185 mg/dL — ABNORMAL HIGH (ref 65–99)
Glucose-Capillary: 207 mg/dL — ABNORMAL HIGH (ref 65–99)
Glucose-Capillary: 227 mg/dL — ABNORMAL HIGH (ref 65–99)

## 2016-02-24 LAB — CBC
HCT: 37 % — ABNORMAL LOW (ref 39.0–52.0)
HEMOGLOBIN: 11.7 g/dL — AB (ref 13.0–17.0)
MCH: 28.3 pg (ref 26.0–34.0)
MCHC: 31.6 g/dL (ref 30.0–36.0)
MCV: 89.4 fL (ref 78.0–100.0)
Platelets: 150 10*3/uL (ref 150–400)
RBC: 4.14 MIL/uL — ABNORMAL LOW (ref 4.22–5.81)
RDW: 15.9 % — AB (ref 11.5–15.5)
WBC: 7.6 10*3/uL (ref 4.0–10.5)

## 2016-02-24 LAB — CREATININE, SERUM
CREATININE: 0.48 mg/dL — AB (ref 0.61–1.24)
GFR calc Af Amer: >60 mL/min (ref >60)

## 2016-02-24 LAB — MRSA PCR SCREENING: MRSA by PCR: NEGATIVE

## 2016-02-24 MED ORDER — BACLOFEN 10 MG PO TABS
10.0000 mg | ORAL_TABLET | Freq: Three times a day (TID) | ORAL | Status: DC | PRN
  Filled 2016-02-24: qty 1

## 2016-02-24 MED ORDER — ASPIRIN EC 81 MG PO TBEC
81.0000 mg | DELAYED_RELEASE_TABLET | Freq: Every day | ORAL | Status: DC
  Administered 2016-02-24 – 2016-02-25 (×2): 81 mg via ORAL
  Filled 2016-02-24 (×2): qty 1

## 2016-02-24 MED ORDER — OXYCODONE-ACETAMINOPHEN 5-325 MG PO TABS
1.0000 | ORAL_TABLET | Freq: Four times a day (QID) | ORAL | Status: DC | PRN
  Administered 2016-02-24: 1 via ORAL
  Filled 2016-02-24: qty 1

## 2016-02-24 MED ORDER — ATORVASTATIN CALCIUM 20 MG PO TABS
20.0000 mg | ORAL_TABLET | Freq: Every evening | ORAL | Status: DC
  Administered 2016-02-24: 20 mg via ORAL
  Filled 2016-02-24: qty 1

## 2016-02-24 MED ORDER — METOLAZONE 5 MG PO TABS
5.0000 mg | ORAL_TABLET | Freq: Every day | ORAL | Status: DC
  Administered 2016-02-24: 5 mg via ORAL
  Filled 2016-02-24: qty 1
  Filled 2016-02-24: qty 2

## 2016-02-24 MED ORDER — POTASSIUM CHLORIDE CRYS ER 20 MEQ PO TBCR
20.0000 meq | EXTENDED_RELEASE_TABLET | Freq: Two times a day (BID) | ORAL | Status: DC
  Administered 2016-02-24 – 2016-02-25 (×4): 20 meq via ORAL
  Filled 2016-02-24 (×4): qty 1

## 2016-02-24 MED ORDER — LOSARTAN POTASSIUM 25 MG PO TABS
25.0000 mg | ORAL_TABLET | Freq: Two times a day (BID) | ORAL | Status: DC
  Administered 2016-02-24 – 2016-02-25 (×4): 25 mg via ORAL
  Filled 2016-02-24 (×4): qty 1

## 2016-02-24 MED ORDER — INSULIN GLARGINE 100 UNIT/ML ~~LOC~~ SOLN
40.0000 [IU] | Freq: Every day | SUBCUTANEOUS | Status: DC
  Administered 2016-02-24 (×2): 40 [IU] via SUBCUTANEOUS
  Filled 2016-02-24 (×3): qty 0.4

## 2016-02-24 MED ORDER — DIGOXIN 125 MCG PO TABS
0.2500 mg | ORAL_TABLET | Freq: Every day | ORAL | Status: DC
  Administered 2016-02-24 – 2016-02-25 (×2): 0.25 mg via ORAL
  Filled 2016-02-24 (×2): qty 2

## 2016-02-24 MED ORDER — SODIUM CHLORIDE 0.9% FLUSH: 3.0000 mL | INTRAVENOUS | Status: DC | PRN

## 2016-02-24 MED ORDER — ESCITALOPRAM OXALATE 10 MG PO TABS
20.0000 mg | ORAL_TABLET | Freq: Every day | ORAL | Status: DC
  Administered 2016-02-24 – 2016-02-25 (×2): 20 mg via ORAL
  Filled 2016-02-24 (×2): qty 2

## 2016-02-24 MED ORDER — FUROSEMIDE 10 MG/ML IJ SOLN
80.0000 mg | Freq: Once | INTRAMUSCULAR | Status: AC
  Administered 2016-02-24: 80 mg via INTRAVENOUS
  Filled 2016-02-24: qty 8

## 2016-02-24 MED ORDER — ENOXAPARIN SODIUM 40 MG/0.4ML ~~LOC~~ SOLN
40.0000 mg | SUBCUTANEOUS | Status: DC
  Administered 2016-02-24 – 2016-02-25 (×2): 40 mg via SUBCUTANEOUS
  Filled 2016-02-24 (×2): qty 0.4

## 2016-02-24 MED ORDER — ZOLPIDEM TARTRATE 5 MG PO TABS
5.0000 mg | ORAL_TABLET | Freq: Every evening | ORAL | Status: DC | PRN
  Filled 2016-02-24: qty 1

## 2016-02-24 MED ORDER — SPIRONOLACTONE 25 MG PO TABS
12.5000 mg | ORAL_TABLET | Freq: Every day | ORAL | Status: DC
  Administered 2016-02-24 – 2016-02-25 (×2): 12.5 mg via ORAL
  Filled 2016-02-24 (×3): qty 1

## 2016-02-24 MED ORDER — SODIUM CHLORIDE 0.9% FLUSH
3.0000 mL | Freq: Two times a day (BID) | INTRAVENOUS | Status: DC
  Administered 2016-02-24 – 2016-02-25 (×3): 3 mL via INTRAVENOUS

## 2016-02-24 MED ORDER — INSULIN ASPART 100 UNIT/ML ~~LOC~~ SOLN
0.0000 [IU] | Freq: Three times a day (TID) | SUBCUTANEOUS | Status: DC
  Administered 2016-02-24 (×2): 3 [IU] via SUBCUTANEOUS
  Administered 2016-02-24 – 2016-02-25 (×2): 2 [IU] via SUBCUTANEOUS

## 2016-02-24 MED ORDER — ALBUTEROL SULFATE (2.5 MG/3ML) 0.083% IN NEBU: 3.0000 mL | INHALATION_SOLUTION | Freq: Four times a day (QID) | RESPIRATORY_TRACT | Status: DC | PRN

## 2016-02-24 MED ORDER — INSULIN ASPART 100 UNIT/ML ~~LOC~~ SOLN: 0.0000 [IU] | Freq: Three times a day (TID) | SUBCUTANEOUS | Status: DC

## 2016-02-24 MED ORDER — ONDANSETRON HCL 4 MG/2ML IJ SOLN
4.0000 mg | Freq: Three times a day (TID) | INTRAMUSCULAR | Status: AC | PRN
  Administered 2016-02-24 (×2): 4 mg via INTRAVENOUS
  Filled 2016-02-24 (×2): qty 2

## 2016-02-24 MED ORDER — SODIUM CHLORIDE 0.9 % IV SOLN: 250.0000 mL | INTRAVENOUS | Status: DC | PRN

## 2016-02-24 MED ORDER — ACETAMINOPHEN 500 MG PO TABS
1000.0000 mg | ORAL_TABLET | Freq: Four times a day (QID) | ORAL | Status: DC | PRN
  Administered 2016-02-24: 1000 mg via ORAL
  Filled 2016-02-24: qty 2

## 2016-02-24 MED ORDER — INSULIN ASPART 100 UNIT/ML ~~LOC~~ SOLN: 0.0000 [IU] | Freq: Every day | SUBCUTANEOUS | Status: DC

## 2016-02-24 NOTE — Care Management Note (Signed)
Case Management Note  Patient Details  Name: Ronald Miller. MRN: [id number] Date of Birth: 11/26/1955   Action/Plan: Note from 02/07/2016  CM talked to patient about DCP; he lives with his sister and brother in [place]; goes to the [facility] in [place] for primary care; has private insurance with [employer] with prescription drug coverage; pharmacy of choice [facility] and he also has mail order pharmacy with [employer] - he receives a 90 day supply of medication; CM encouraged pt to not let his medication "run out" especially with mail order prescriptions. He does not use any DME at this time, does not exercise; stated " I'm just tired and don't feel like doing anything." CM encouraged pt to talk to his physician about possibly walking a short distance. He eats a heart health diet low in sodium. Also he stated that he is replacing his digital scales to weigh himself daily. CM will continue to follow for DCP.       02/24/2016- Pt known to me from previous admission. Stated that he has been eating healthy, low sodium but did eat pizza. CM offered to have the Dietitian talk to him but he refused, stated, I have all of that information from last time. CM will continue to follow for DCP.        Expected Discharge Date:     Possibly 02/28/2016            Expected Discharge Plan:  Home/Self Care  Discharge planning Services  CM Consult   Choice offered to:  NA  Status of Service:  In process, will continue to follow  Sherrilyn Rist [id number] 02/24/2016, 1:39 PM

## 2016-02-24 NOTE — Progress Notes (Signed)
Pt arrived to floor in NAD, pt refused bed alarm, steady on feet, low fall risk. MD on call paged to notify of new admit.

## 2016-02-24 NOTE — H&P (Signed)
HPI: Ronald Miller. is a 60 y.o. male with history of chronic systolic HF s/p [employer] ICD 2014, CAD s/p CABG in 2000, HTN, Hx of cocaine abuse, Tobacco abuse, Depression, and COPD.   Echo 10/14/15 LVEF 20-25%, RV mildly dilated.   Admitted to [facility] from 4/24 - [id number] with A/C systolic HF. Had a US guided Paracentesis with 5 L removed. They decreased his home lasix to 40 mg daily due to low blood pressure. BB held as well with bradycardia and hypotension. Discharge weight 239 lbs  Admitted 5/17 with volume overload and CP. Echo 02/10/16 LVEF 15%, Mild MR, Mod LAE, Severe RAE, severely reduced RV function, PA peak pressure 31 mm Hg. Underwent paracentesis 02/10/16 with 5.2 L out. Ultrasound showed severe fatty liver. Had [facility] yesterday with severe native 3v CAD, All grafts occluded except LIMA to LAD with faint L to R collaterals, elevated filling pressures with normal cardiac output. No targets for revascularization. Weight on d/c 247 -> 219 pounds.   He presents to [facility] ED today with CP.  States it has been on and off for 2 days.  Mostly constant with resolution for couple of hours.  Aching in nature, radiating to shoulders.  Associated with nausea, vomiting and 6lbs weight gain overnight.  Similar to prior episodes with fluid retention.  States he was doing much better recently and working hard on his diet watching salt and calories. No orthopnea or PND. No problems with meds.    Review of Systems:     Cardiac Review of Systems: {Y] = yes [ ]  = no  Chest Pain [    ]  Resting SOB [   ] Exertional SOB  [  ]  Orthopnea [  ]   Pedal Edema [   ]    Palpitations [  ] Syncope  [  ]   Presyncope [   ]  General Review of Systems: [Y] = yes [  ]=no Constitional: recent weight change [  ]; anorexia [  ]; fatigue [  ]; nausea [  ]; night sweats [  ]; fever [  ]; or chills [  ];                                                                      Dental: poor dentition[  ];   Eye : blurred vision [   ]; diplopia [   ]; vision changes [  ];  Amaurosis fugax[  ]; Resp: cough [  ];  wheezing[  ];  hemoptysis[  ]; shortness of breath[  ]; paroxysmal nocturnal dyspnea[  ]; dyspnea on exertion[  ]; or orthopnea[  ];  GI:  gallstones[  ], vomiting[  ];  dysphagia[  ]; melena[  ];  hematochezia [  ]; heartburn[  ];   GU: kidney stones [  ]; hematuria[  ];   dysuria [  ];  nocturia[  ];               Skin: rash [  ], swelling[  ];, hair loss[  ];  peripheral edema[  ];  or itching[  ]; Musculosketetal: myalgias[  ];  joint swelling[  ];  joint erythema[  ];  joint  pain[  ];  back pain[  ];  Heme/Lymph: bruising[  ];  bleeding[  ];  anemia[  ];  Neuro: TIA[  ];  headaches[  ];  stroke[  ];  vertigo[  ];  seizures[  ];   paresthesias[  ];  difficulty walking[  ];  Psych:depression[  ]; anxiety[  ];  Endocrine: diabetes[  ];  thyroid dysfunction[  ];  Other:  Past Medical History  Diagnosis Date  . ASCVD (arteriosclerotic cardiovascular disease)   . Diabetes mellitus   . Hypertension   . History of cocaine abuse   . Tobacco abuse   . Depression   . Suicidal ideation   . Chronic systolic CHF (congestive heart failure) ([place])   . COPD (chronic obstructive pulmonary disease) ([place])   . Coronary artery disease   . AICD (automatic cardioverter/defibrillator) present   . Shortness of breath dyspnea     No current facility-administered medications on file prior to encounter.   Current Outpatient Prescriptions on File Prior to Encounter  Medication Sig Dispense Refill  . acetaminophen (TYLENOL) 500 MG tablet Take 1,000 mg by mouth every 6 (six) hours as needed for mild pain or headache.    . albuterol (PROVENTIL HFA;VENTOLIN HFA) 108 (90 BASE) MCG/ACT inhaler Inhale 2 puffs into the lungs every 6 (six) hours as needed for wheezing or shortness of breath.    Marland Kitchen aspirin EC 81 MG tablet Take 81 mg by mouth daily.    Marland Kitchen atorvastatin (LIPITOR) 20 MG tablet Take 1 tablet (20 mg total) by mouth every  evening. 30 tablet 0  . digoxin (LANOXIN) 0.25 MG tablet Take 1 tablet (0.25 mg total) by mouth daily. 90 tablet 1  . escitalopram (LEXAPRO) 20 MG tablet Take 1 tablet (20 mg total) by mouth daily. 30 tablet 0  . insulin aspart (NOVOLOG) 100 UNIT/ML injection Inject 0-15 Units into the skin 3 (three) times daily with meals. SLIDING SCALE    . insulin glargine (LANTUS) 100 UNIT/ML injection Inject 40 Units into the skin at bedtime.     Marland Kitchen losartan (COZAAR) 25 MG tablet Take 1 tablet (25 mg total) by mouth 2 (two) times daily. 180 tablet 1  . metolazone (ZAROXOLYN) 5 MG tablet Take 5 mg by mouth daily as needed. Reported on 02/19/2016    . potassium chloride SA (K-DUR,KLOR-CON) 20 MEQ tablet Take 1 tablet (20 mEq total) by mouth 2 (two) times daily. 180 tablet 1  . spironolactone (ALDACTONE) 25 MG tablet Take 0.5 tablets (12.5 mg total) by mouth daily. 45 tablet 1  . torsemide (DEMADEX) 20 MG tablet Take 1 tablet (20 mg total) by mouth 2 (two) times daily. 180 tablet 1  . zolpidem (AMBIEN) 5 MG tablet Take 1 tablet (5 mg total) by mouth at bedtime as needed for sleep. 30 tablet 0     Allergies  Allergen Reactions  . Codeine Nausea And Vomiting    Social History   Social History  . Marital Status: Single    Spouse Name: N/A  . Number of Children: N/A  . Years of Education: N/A   Occupational History  . Retired   . Truck driver    Social History Main Topics  . Smoking status: Former Smoker -- 1.50 packs/day for 23 years    Types: Cigarettes    Quit date: 08/27/2015  . Smokeless tobacco: Never Used  . Alcohol Use: No  . Drug Use: No     Comment: Former  . Sexual Activity: Not  on file   Other Topics Concern  . Not on file   Social History Narrative   Divorced with one child   No regular exercise    Family History  Problem Relation Age of Onset  . CAD Father   . Diabetes Father   . Heart failure Father     PHYSICAL EXAM: Filed Vitals:   02/23/16 2300 02/24/16 0036    BP: 95/72 110/81  Pulse: 89 92  Temp:  98 F (36.7 C)  Resp: 16 18   General:  Well appearing. No respiratory difficulty HEENT: normal Neck: supple. Elevated JVP to angle of jaw. Carotids 2+ bilat; no bruits. No lymphadenopathy or thryomegaly appreciated. Cor: PMI nondisplaced. Regular rate & rhythm. No rubs, gallops, 2/6 holosystolic murmur. Lungs: clear Abdomen: soft, mildly tender RUQ, nondistended. No hepatosplenomegaly. No bruits or masses. Good bowel sounds. Extremities: no cyanosis, clubbing, rash, edema Neuro: alert & oriented x 3, cranial nerves grossly intact. moves all 4 extremities w/o difficulty. Affect pleasant.  ECG: SR, RBBB, inferior infarct, PRWP  Results for orders placed or performed during the hospital encounter of 02/23/16 (from the past 24 hour(s))  Basic metabolic panel     Status: Abnormal   Collection Time: 02/23/16  6:44 PM  Result Value Ref Range   Sodium 136 135 - 145 mmol/L   Potassium 4.2 3.5 - 5.1 mmol/L   Chloride 104 101 - 111 mmol/L   CO2 23 22 - 32 mmol/L   Glucose, Bld 199 (H) 65 - 99 mg/dL   BUN 26 (H) 6 - 20 mg/dL   Creatinine, Ser 0.96 0.61 - 1.24 mg/dL   Calcium 9.2 8.9 - 10.3 mg/dL   GFR calc non Af Amer >60 >60 mL/min   GFR calc Af Amer >60 >60 mL/min   Anion gap 9 5 - 15  CBC     Status: Abnormal   Collection Time: 02/23/16  6:44 PM  Result Value Ref Range   WBC 7.6 4.0 - 10.5 K/uL   RBC 4.21 (L) 4.22 - 5.81 MIL/uL   Hemoglobin 12.0 (L) 13.0 - 17.0 g/dL   HCT 37.7 (L) 39.0 - 52.0 %   MCV 89.5 78.0 - 100.0 fL   MCH 28.5 26.0 - 34.0 pg   MCHC 31.8 30.0 - 36.0 g/dL   RDW 15.7 (H) 11.5 - 15.5 %   Platelets 159 150 - 400 K/uL  I-stat troponin, ED     Status: Abnormal   Collection Time: 02/23/16  6:47 PM  Result Value Ref Range   Troponin i, poc 0.10 (HH) 0.00 - 0.08 ng/mL   Comment NOTIFIED PHYSICIAN    Comment 3          Brain natriuretic peptide     Status: Abnormal   Collection Time: 02/23/16  8:35 PM  Result Value Ref  Range   B Natriuretic Peptide 1372.7 (H) 0.0 - 100.0 pg/mL  Digoxin level     Status: None   Collection Time: 02/23/16  8:43 PM  Result Value Ref Range   Digoxin Level 0.8 0.8 - 2.0 ng/mL  Glucose, capillary     Status: Abnormal   Collection Time: 02/24/16 12:40 AM  Result Value Ref Range   Glucose-Capillary 227 (H) 65 - 99 mg/dL   Comment 1 Notify RN    Comment 2 Document in Chart    Dg Chest 2 View  02/23/2016  CLINICAL DATA:  Acute onset of shortness of breath and mid chest pain. Back pain. Initial  encounter. EXAM: CHEST  2 VIEW COMPARISON:  Chest radiograph performed 02/09/2016 FINDINGS: The lungs are well-aerated. Mild vascular congestion is noted. There is no evidence of focal opacification, pleural effusion or pneumothorax. The heart is mildly enlarged. The patient is status post median sternotomy. A pacemaker/AICD is noted at the left chest wall, with leads ending at the right atrium, right ventricle and coronary sinus. No acute osseous abnormalities are seen. IMPRESSION: Mild cardiomegaly. Mild vascular congestion, without significant pulmonary edema. Electronically Signed   By: Garald Balding M.D.   On: 02/23/2016 19:06     ASSESSMENT: 60 yo man with HTN, DM, ICM who presents with CP likely secondary to acute on chronic combined systolic/diastolic CHF.    PLAN/DISCUSSION: Continue home medications with exception of torsemide IV Furosemide with home metolazone for diuresis Cycle troponin though suspect demand ischemia in setting of volume overload Check dig level given N/V Fluid restrict, daily weights

## 2016-02-24 NOTE — Progress Notes (Signed)
Per MD note, need to cycle troponins, no lab orders for troponins currently, notified cardiology PA and new order placed. Day shift RN aware, pt currently sleeping

## 2016-02-24 NOTE — Progress Notes (Signed)
Inpatient Diabetes Program Recommendations  AACE/ADA: New Consensus Statement on Inpatient Glycemic Control (2015)  Target Ranges:  Prepandial:   less than 140 mg/dL      Peak postprandial:   less than 180 mg/dL (1-2 hours)      Critically ill patients:  140 - 180 mg/dL  Results for OCTAVE, WOODROME (MRN [id number]) as of 02/24/2016 10:33  Ref. Range 02/13/2016 21:40 02/14/2016 06:50 02/14/2016 11:54 02/24/2016 00:40 02/24/2016 06:13  Glucose-Capillary Latest Ref Range: 65-99 mg/dL 196 (H) 150 (H) 173 (H) 227 (H) 207 (H)   Review of Glycemic Control  Diabetes history: DM 2 Outpatient Diabetes medications: Lantus 40 units q hs + Novolog correction 0-15 tid Current orders for Inpatient glycemic control: Lantus 40 units q hs + Novolog 0-9 units tid + 0-5 hs  Inpatient Diabetes Program Recommendations:  Please consider meal coverage 4 units tid with meals (hold if eats <50%). Noted A1c 8.4.  Thank you, Nani Gasser. Risa Auman, RN, MSN, CDE Inpatient Glycemic Control Team Team Pager [phone] (8am-5pm) 02/24/2016 10:37 AM

## 2016-02-25 LAB — GLUCOSE, CAPILLARY
Glucose-Capillary: 115 mg/dL — ABNORMAL HIGH (ref 65–99)
Glucose-Capillary: 190 mg/dL — ABNORMAL HIGH (ref 65–99)

## 2016-02-25 LAB — BASIC METABOLIC PANEL
Anion gap: 9 (ref 5–15)
BUN: 28 mg/dL — ABNORMAL HIGH (ref 6–20)
CHLORIDE: 97 mmol/L — AB (ref 101–111)
CO2: 28 mmol/L (ref 22–32)
CREATININE: 1.03 mg/dL (ref 0.61–1.24)
Calcium: 8.9 mg/dL (ref 8.9–10.3)
GFR calc non Af Amer: >60 mL/min (ref >60)
Glucose, Bld: 124 mg/dL — ABNORMAL HIGH (ref 65–99)
POTASSIUM: 3.5 mmol/L (ref 3.5–5.1)
Sodium: 134 mmol/L — ABNORMAL LOW (ref 135–145)

## 2016-02-25 MED ORDER — POTASSIUM CHLORIDE CRYS ER 20 MEQ PO TBCR
40.0000 meq | EXTENDED_RELEASE_TABLET | Freq: Once | ORAL | Status: AC
  Administered 2016-02-25: 40 meq via ORAL
  Filled 2016-02-25: qty 2

## 2016-02-25 MED ORDER — FUROSEMIDE 10 MG/ML IJ SOLN: 80.0000 mg | Freq: Once | INTRAMUSCULAR | Status: DC

## 2016-02-25 MED ORDER — TORSEMIDE 20 MG PO TABS: 20.0000 mg | ORAL_TABLET | Freq: Two times a day (BID) | ORAL | Status: DC

## 2016-02-25 MED ORDER — FUROSEMIDE 10 MG/ML IJ SOLN
80.0000 mg | Freq: Once | INTRAMUSCULAR | Status: AC
  Administered 2016-02-25: 80 mg via INTRAVENOUS
  Filled 2016-02-25: qty 8

## 2016-02-25 MED ORDER — METOLAZONE 2.5 MG PO TABS
2.5000 mg | ORAL_TABLET | Freq: Once | ORAL | Status: AC
  Administered 2016-02-25: 2.5 mg via ORAL
  Filled 2016-02-25: qty 1

## 2016-02-25 NOTE — Progress Notes (Signed)
Advanced Heart Failure Rounding Note  PCP: [facility]  Primary HF: Dr. Haroldine Laws   Subjective:    Admitted yesterday. Given IV lasix. Troponins flat without trend. CXR with mild vascular congestion, without significant edema.   Creatinine stable. 0.96 -> 1.03 (Do not think 0.48 in the interim was correct)  Still feeling fatigued today. No further CP.  Breathing stable. Per NT he has been disobeying fluid restrictions. When confronted he states "I didn't think I was drinking that much".  Explained importance of fluid restriction.   Out 600 cc yesterday, though weight shows up nearly 2 lbs despite Lasix IV 80 mg TID.   Objective:   Weight Range: 228 lb 14.4 oz (103.828 kg) Body mass index is 33.79 kg/(m^2).   Vital Signs:   Temp:  [97.9 F (36.6 C)-98.3 F (36.8 C)] 98.3 F (36.8 C) (05/24 0332) Pulse Rate:  [80-94] 87 (05/24 0927) Resp:  [16] 16 (05/24 0332) BP: (89-103)/(65-77) 96/67 mmHg (05/24 0927) SpO2:  [96 %-100 %] 96 % (05/24 0332) Weight:  [228 lb 14.4 oz (103.828 kg)] 228 lb 14.4 oz (103.828 kg) (05/24 0332) Last BM Date: 02/23/16  Weight change: Filed Weights   02/24/16 0036 02/25/16 0332  Weight: 227 lb 1.6 oz (103.012 kg) 228 lb 14.4 oz (103.828 kg)    Intake/Output:   Intake/Output Summary (Last 24 hours) at 02/25/16 1025 Last data filed at 02/25/16 0929  Gross per 24 hour  Intake    600 ml  Output   2350 ml  Net  -1750 ml     Physical Exam: General:In bed, NAD HEENT: normal Neck: supple. JVD 8-9 Carotids 2+ bilat; no bruits. No thyromegaly or nodule noted. Cor: PMI nondisplaced. RRR. No rubs, gallops or murmurs. Lungs: Mildly diminished bases Abdomen: Obese, NT/ND. No appreciable hepatosplenomegaly. No bruits or masses. +BS Extremities: no cyanosis, clubbing. Tr to 1+ edema. Chronic venous stasis changes with healing wound on R shin  Neuro: alert & oriented x 3, cranial nerves grossly intact. moves all 4 extremities w/o  difficulty. Affect pleasant.  Telemetry: Reviewed, NSR.   Labs: CBC  Recent Labs  02/23/16 1844 02/24/16 0214  WBC 7.6 7.6  HGB 12.0* 11.7*  HCT 37.7* 37.0*  MCV 89.5 89.4  PLT 159 [id number]   Basic Metabolic Panel  Recent Labs  02/23/16 1844 02/24/16 0214 02/25/16 0430  NA 136  --  134*  K 4.2  --  3.5  CL 104  --  97*  CO2 23  --  28  GLUCOSE 199*  --  124*  BUN 26*  --  28*  CREATININE 0.96 0.48* 1.03  CALCIUM 9.2  --  8.9   Liver Function Tests No results for input(s): AST, ALT, ALKPHOS, BILITOT, PROT, ALBUMIN in the last 72 hours. No results for input(s): LIPASE, AMYLASE in the last 72 hours. Cardiac Enzymes  Recent Labs  02/24/16 0718 02/24/16 1324 02/24/16 1830  TROPONINI 0.08* 0.07* 0.07*    BNP: BNP (last 3 results)  Recent Labs  01/26/16 0911 02/09/16 1515 02/23/16 2035  BNP 1578.0* 1346.8* 1372.7*    ProBNP (last 3 results) No results for input(s): PROBNP in the last 8760 hours.   D-Dimer No results for input(s): DDIMER in the last 72 hours. Hemoglobin A1C No results for input(s): HGBA1C in the last 72 hours. Fasting Lipid Panel No results for input(s): CHOL, HDL, LDLCALC, TRIG, CHOLHDL, LDLDIRECT in the last 72 hours. Thyroid Function Tests No results for input(s): TSH, T4TOTAL,  T3FREE, THYROIDAB in the last 72 hours.  Invalid input(s): FREET3  Other results:     Imaging/Studies:  Dg Chest 2 View  02/23/2016  CLINICAL DATA:  Acute onset of shortness of breath and mid chest pain. Back pain. Initial encounter. EXAM: CHEST  2 VIEW COMPARISON:  Chest radiograph performed 02/09/2016 FINDINGS: The lungs are well-aerated. Mild vascular congestion is noted. There is no evidence of focal opacification, pleural effusion or pneumothorax. The heart is mildly enlarged. The patient is status post median sternotomy. A pacemaker/AICD is noted at the left chest wall, with leads ending at the right atrium, right ventricle and coronary sinus. No  acute osseous abnormalities are seen. IMPRESSION: Mild cardiomegaly. Mild vascular congestion, without significant pulmonary edema. Electronically Signed   By: Garald Balding M.D.   On: 02/23/2016 19:06   Dg Abd 1 View  02/24/2016  CLINICAL DATA:  Lower abdomen pain for 2 weeks, nausea/vomiting for 3 days EXAM: ABDOMEN - 1 VIEW COMPARISON:  None. FINDINGS: Nonobstructive bowel gas pattern. Cholecystectomy clips noted. Hernia mesh seen over right low abdomen. No other abnormal opacities. IMPRESSION: No acute findings Electronically Signed   By: Skipper Cliche M.D.   On: 02/24/2016 11:18     Latest Echo  Latest Cath   Medications:     Scheduled Medications: . aspirin EC  81 mg Oral Daily  . atorvastatin  20 mg Oral QPM  . digoxin  0.25 mg Oral Daily  . enoxaparin (LOVENOX) injection  40 mg Subcutaneous Q24H  . escitalopram  20 mg Oral Daily  . insulin aspart  0-5 Units Subcutaneous QHS  . insulin aspart  0-9 Units Subcutaneous TID WC  . insulin glargine  40 Units Subcutaneous QHS  . losartan  25 mg Oral BID  . potassium chloride SA  20 mEq Oral BID  . sodium chloride flush  3 mL Intravenous Q12H  . spironolactone  12.5 mg Oral Daily     Infusions:     PRN Medications:  sodium chloride, acetaminophen, albuterol, baclofen, oxyCODONE-acetaminophen, sodium chloride flush, zolpidem   Assessment   1. Acute on chronic systolic HF with severe biventricular dysfunction EF 15% due to NICM 2. CAD --severe 3v- CAD s/p CABG with occluded grafts as above except for LIMA. No targets for revascularization --Continue ASA, statin  3. HTN 4. DMII 5. Fatty liver with ascites  Plan    He continues with mild volume overload.  Give 80 mg IV lasix with 2.5 mg metolazone this am.   Supp K. Will discuss with MD. May be able to go home this evening vs in am.   Non-compliance an ongoing issue with fluid restriction not being followed per floor staff.  Discussed further the importance of  the boundaries set by providers.   Scheduled for follow up next week.  Length of Stay: 2  [name] Friar PA-C 02/25/2016, 10:25 AM  Advanced Heart Failure Team Pager [phone] (M-F; 7a - 4p)  Please contact [facility] for night-coverage after hours (4p -7a ) and weekends on [email or web address]   Patient seen and examined with Oda Kilts, PA-C. We discussed all aspects of the encounter. I agree with the assessment and plan as stated above.   He is improved. Agree with increasing demadex to 40/20. Reinforced need for daily weights and reviewed use of sliding scale diuretics.Follow up next week in HF clinic.   Jaydin Jalomo,MD 2:14 PM

## 2016-02-25 NOTE — Discharge Summary (Signed)
Advanced Heart Failure Discharge Note   Discharge Summary   Patient ID: Ronald Miller. MRN: [id number], DOB/AGE: 04-12-1956 60 y.o. Admit date: 02/23/2016 D/C date:     02/25/2016   Primary Discharge Diagnoses:  1. Acute on chronic systolic HF with severe biventricular dysfunction EF 15% due to NICM 2. CAD --severe 3v- CAD s/p CABG with occluded grafts as above except for LIMA. No targets for revascularization --Continue ASA, statin  3. HTN 4. DMII 5. Fatty liver with ascites   Hospital Course:   Ronald Miller. is a 60 y.o. male with history of chronic systolic HF s/p Medtronic ICD 2014, CAD s/p CABG in 2000, HTN, Hx of cocaine abuse, Tobacco abuse, Depression, and COPD who presented to [facility] with CP and SOB with associated weight gain of 6 lbs.  Admitted for IV diuresis. Weight only up several lbs from recent HF clinic visit.   CXR without significant pulmonary edema though with mild vascular congestion.  Diuresed well and symptoms improved on IV lasix.  Had no further CP. Troponins were flat without trend.  BNP comparable to recent checks, may actually be near his baseline.   Overall he was negative 1.3 L with IV lasix up to 80 mg TID with metolazone.   On the day prior to discharge he was thought to have mild volume on board so given addition morning dose of 80 mg lasix with 2.5 mg metolazone.   He was then thought to be stable for discharge with close follow up in the HF clinic as below.  He will be discharged home in stable condition.    Discharge Weight Range: 228 lb Discharge Vitals: Blood pressure 102/67, pulse 88, temperature 97.4 F (36.3 C), temperature source Oral, resp. rate 20, height 5\' 9"  (1.753 m), weight 228 lb 14.4 oz (103.828 kg), SpO2 98 %.  Labs: Lab Results  Component Value Date   WBC 7.6 02/24/2016   HGB 11.7* 02/24/2016   HCT 37.0* 02/24/2016   MCV 89.4 02/24/2016   PLT 150 02/24/2016    Recent Labs Lab 02/19/16 1125  02/25/16 0430  NA 137  <  > 134*  K 4.2  < > 3.5  CL 97*  < > 97*  CO2 29  < > 28  BUN 26*  < > 28*  CREATININE 1.20  < > 1.03  CALCIUM 9.1  < > 8.9  PROT 7.4  --   --   BILITOT 0.9  --   --   ALKPHOS 89  --   --   ALT 16*  --   --   AST 25  --   --   GLUCOSE 143*  < > 124*  < > = values in this interval not displayed. Lab Results  Component Value Date   CHOL 153 08/15/2013   HDL 37* 08/15/2013   LDLCALC 89 08/15/2013   TRIG 133 08/15/2013   BNP (last 3 results)  Recent Labs  01/26/16 0911 02/09/16 1515 02/23/16 2035  BNP 1578.0* 1346.8* 1372.7*    ProBNP (last 3 results) No results for input(s): PROBNP in the last 8760 hours.   Diagnostic Studies/Procedures   Dg Chest 2 View  02/23/2016  CLINICAL DATA:  Acute onset of shortness of breath and mid chest pain. Back pain. Initial encounter. EXAM: CHEST  2 VIEW COMPARISON:  Chest radiograph performed 02/09/2016 FINDINGS: The lungs are well-aerated. Mild vascular congestion is noted. There is no evidence of focal opacification, pleural effusion or pneumothorax.  The heart is mildly enlarged. The patient is status post median sternotomy. A pacemaker/AICD is noted at the left chest wall, with leads ending at the right atrium, right ventricle and coronary sinus. No acute osseous abnormalities are seen. IMPRESSION: Mild cardiomegaly. Mild vascular congestion, without significant pulmonary edema. Electronically Signed   By: Garald Balding M.D.   On: 02/23/2016 19:06   Dg Abd 1 View  02/24/2016  CLINICAL DATA:  Lower abdomen pain for 2 weeks, nausea/vomiting for 3 days EXAM: ABDOMEN - 1 VIEW COMPARISON:  None. FINDINGS: Nonobstructive bowel gas pattern. Cholecystectomy clips noted. Hernia mesh seen over right low abdomen. No other abnormal opacities. IMPRESSION: No acute findings Electronically Signed   By: Skipper Cliche M.D.   On: 02/24/2016 11:18    Discharge Medications     Medication List    TAKE these medications        acetaminophen 500 MG  tablet  Commonly known as:  TYLENOL  Take 1,000 mg by mouth every 6 (six) hours as needed for mild pain or headache.     albuterol 108 (90 Base) MCG/ACT inhaler  Commonly known as:  PROVENTIL HFA;VENTOLIN HFA  Inhale 2 puffs into the lungs every 6 (six) hours as needed for wheezing or shortness of breath.     aspirin EC 81 MG tablet  Take 81 mg by mouth daily.     atorvastatin 20 MG tablet  Commonly known as:  LIPITOR  Take 1 tablet (20 mg total) by mouth every evening.     baclofen 10 MG tablet  Commonly known as:  LIORESAL  Take 10 mg by mouth 3 (three) times daily as needed for muscle spasms.     digoxin 0.25 MG tablet  Commonly known as:  LANOXIN  Take 1 tablet (0.25 mg total) by mouth daily.     escitalopram 20 MG tablet  Commonly known as:  LEXAPRO  Take 1 tablet (20 mg total) by mouth daily.     insulin aspart 100 UNIT/ML injection  Commonly known as:  novoLOG  Inject 0-15 Units into the skin 3 (three) times daily with meals. SLIDING SCALE     insulin glargine 100 UNIT/ML injection  Commonly known as:  LANTUS  Inject 40 Units into the skin at bedtime.     losartan 25 MG tablet  Commonly known as:  COZAAR  Take 1 tablet (25 mg total) by mouth 2 (two) times daily.     metolazone 5 MG tablet  Commonly known as:  ZAROXOLYN  Take 5 mg by mouth daily as needed. Reported on 02/19/2016     potassium chloride SA 20 MEQ tablet  Commonly known as:  K-DUR,KLOR-CON  Take 1 tablet (20 mEq total) by mouth 2 (two) times daily.     spironolactone 25 MG tablet  Commonly known as:  ALDACTONE  Take 0.5 tablets (12.5 mg total) by mouth daily.     torsemide 20 MG tablet  Commonly known as:  DEMADEX  Take 1-2 tablets (20-40 mg total) by mouth 2 (two) times daily. Take 2 tablets in am and 1 tablet in pm.     zolpidem 5 MG tablet  Commonly known as:  AMBIEN  Take 1 tablet (5 mg total) by mouth at bedtime as needed for sleep.        Disposition   The patient will be  discharged in stable condition to home. Discharge Instructions    Diet - low sodium heart healthy    Complete by:  As directed      Heart Failure patients record your daily weight using the same scale at the same time of day    Complete by:  As directed      Increase activity slowly    Complete by:  As directed           Follow-up Information    Follow up with [facility] On 03/04/2016.   Specialty:  Cardiology   Why:  at 3 pm for post hospital follow up.  Please bring all of your medications. The code for parking is 0020. (Try 0002 if that doesn't work, May code)   Sport and exercise psychologist information:   [address] [id number] [place] [phone]        Duration of Discharge Encounter: Greater than 35 minutes   Signed, Annamaria Helling 02/25/2016, 2:08 PM   Patient seen and examined with Oda Kilts, PA-C. We discussed all aspects of the encounter. I agree with the assessment and plan as stated above.   He is improved. Agree with increasing demadex to 40/20. Reinforced need for daily weights and reviewed use of sliding scale diuretics.Follow up next week in HF clinic.   Fleurette Woolbright,MD 2:14 PM

## 2016-02-25 NOTE — Care Management Important Message (Signed)
Important Message  Patient Details  Name: Ronald Miller. MRN: [id number] Date of Birth: 1956/05/21   Medicare Important Message Given:  Yes    Loann Quill 02/25/2016, 11:21 AM

## 2016-03-04 ENCOUNTER — Inpatient Hospital Stay (HOSPITAL_COMMUNITY): Payer: Medicare HMO

## 2016-03-09 ENCOUNTER — Ambulatory Visit (HOSPITAL_COMMUNITY)
Admission: RE | Admit: 2016-03-09 | Discharge: 2016-03-09 | Disposition: A | Discharge status: Home / Self Care | Payer: Medicare HMO | Source: Ambulatory Visit | Attending: Cardiology | Admitting: Cardiology

## 2016-03-09 ENCOUNTER — Encounter: Payer: Self-pay | Admitting: Licensed Clinical Social Worker

## 2016-03-09 VITALS — BP 102/68 | HR 93 | Wt 235.2 lb

## 2016-03-09 DIAGNOSIS — R188 Other ascites: Secondary | ICD-10-CM | POA: Diagnosis not present

## 2016-03-09 DIAGNOSIS — F329 Major depressive disorder, single episode, unspecified: Secondary | ICD-10-CM | POA: Insufficient documentation

## 2016-03-09 DIAGNOSIS — Z87891 Personal history of nicotine dependence: Secondary | ICD-10-CM | POA: Diagnosis not present

## 2016-03-09 DIAGNOSIS — I5023 Acute on chronic systolic (congestive) heart failure: Secondary | ICD-10-CM | POA: Diagnosis not present

## 2016-03-09 DIAGNOSIS — Z951 Presence of aortocoronary bypass graft: Secondary | ICD-10-CM | POA: Insufficient documentation

## 2016-03-09 DIAGNOSIS — I110 Hypertensive heart disease with heart failure: Secondary | ICD-10-CM | POA: Diagnosis not present

## 2016-03-09 DIAGNOSIS — Z95810 Presence of automatic (implantable) cardiac defibrillator: Secondary | ICD-10-CM | POA: Diagnosis not present

## 2016-03-09 DIAGNOSIS — I428 Other cardiomyopathies: Secondary | ICD-10-CM | POA: Insufficient documentation

## 2016-03-09 DIAGNOSIS — I2510 Atherosclerotic heart disease of native coronary artery without angina pectoris: Secondary | ICD-10-CM | POA: Insufficient documentation

## 2016-03-09 DIAGNOSIS — J449 Chronic obstructive pulmonary disease, unspecified: Secondary | ICD-10-CM | POA: Diagnosis not present

## 2016-03-09 DIAGNOSIS — I159 Secondary hypertension, unspecified: Secondary | ICD-10-CM

## 2016-03-09 DIAGNOSIS — I5040 Unspecified combined systolic (congestive) and diastolic (congestive) heart failure: Secondary | ICD-10-CM | POA: Diagnosis present

## 2016-03-09 DIAGNOSIS — K760 Fatty (change of) liver, not elsewhere classified: Secondary | ICD-10-CM | POA: Insufficient documentation

## 2016-03-09 DIAGNOSIS — Z794 Long term (current) use of insulin: Secondary | ICD-10-CM | POA: Insufficient documentation

## 2016-03-09 DIAGNOSIS — Z833 Family history of diabetes mellitus: Secondary | ICD-10-CM | POA: Diagnosis not present

## 2016-03-09 DIAGNOSIS — Z885 Allergy status to narcotic agent status: Secondary | ICD-10-CM | POA: Insufficient documentation

## 2016-03-09 DIAGNOSIS — Z7982 Long term (current) use of aspirin: Secondary | ICD-10-CM | POA: Diagnosis not present

## 2016-03-09 DIAGNOSIS — E119 Type 2 diabetes mellitus without complications: Secondary | ICD-10-CM | POA: Diagnosis not present

## 2016-03-09 DIAGNOSIS — Z79899 Other long term (current) drug therapy: Secondary | ICD-10-CM | POA: Insufficient documentation

## 2016-03-09 DIAGNOSIS — I5043 Acute on chronic combined systolic (congestive) and diastolic (congestive) heart failure: Secondary | ICD-10-CM | POA: Diagnosis not present

## 2016-03-09 DIAGNOSIS — Z8249 Family history of ischemic heart disease and other diseases of the circulatory system: Secondary | ICD-10-CM | POA: Insufficient documentation

## 2016-03-09 LAB — BASIC METABOLIC PANEL
Anion gap: 8 (ref 5–15)
BUN: 37 mg/dL — ABNORMAL HIGH (ref 6–20)
CHLORIDE: 97 mmol/L — AB (ref 101–111)
CO2: 26 mmol/L (ref 22–32)
CREATININE: 1.16 mg/dL (ref 0.61–1.24)
Calcium: 9 mg/dL (ref 8.9–10.3)
GFR calc non Af Amer: >60 mL/min (ref >60)
Glucose, Bld: 155 mg/dL — ABNORMAL HIGH (ref 65–99)
POTASSIUM: 4.2 mmol/L (ref 3.5–5.1)
Sodium: 131 mmol/L — ABNORMAL LOW (ref 135–145)

## 2016-03-09 LAB — BRAIN NATRIURETIC PEPTIDE: B Natriuretic Peptide: 1634.5 pg/mL — ABNORMAL HIGH (ref 0.0–100.0)

## 2016-03-09 MED ORDER — TORSEMIDE 20 MG PO TABS: 40.0000 mg | ORAL_TABLET | Freq: Two times a day (BID) | ORAL | Status: DC

## 2016-03-09 MED ORDER — METOLAZONE 5 MG PO TABS: 5.0000 mg | ORAL_TABLET | Freq: Every day | ORAL | Status: DC | PRN

## 2016-03-09 NOTE — Progress Notes (Signed)
Patient ID: Ronald Lappe., male   DOB: 1956/05/08, 60 y.o.   MRN: [id number]    Advanced Heart Failure Clinic Note   Referring Physician: Darylene Price, FNP Primary Care: [facility] Primary Cardiologist: Dr Haroldine Laws.   HPI:   Ronald Miller. is a 60 y.o. male with history of chronic systolic HF s/p Medtronic ICD 2014, CAD s/p CABG in 2000, HTN, Hx of cocaine abuse, Tobacco abuse, Depression, and COPD.   Echo 10/14/15 LVEF 20-25%, RV mildly dilated.   Admitted to [facility] from 4/24 - [id number] with A/C systolic HF.  Had a US guided Paracentesis with 5 L removed. They decreased his home lasix to 40 mg daily due to low blood pressure. BB held as well with bradycardia and hypotension.  Discharge weight 239 lbs  Admitted 5/17 with volume overload and CP. Echo 02/10/16 LVEF 15%, Mild MR, Mod LAE, Severe RAE, severely reduced RV function, PA peak pressure 31 mm Hg. Underwent paracentesis 02/10/16 with 5.2 L out. Ultrasound showed severe fatty liver. Had [facility] yesterday with severe native 3v CAD, All grafts occluded except LIMA to LAD with faint L to R collaterals, elevated filling pressures with normal cardiac output. No targets for revascularization. Weight on d/c 247 -> 219 pounds.   Admitted 5/22 through 5/24 with chest pain and dyspnea. Diuresed with IV lasix and transitioned to torsemide 40 /20 daily. Discharge weight 228 pounds.   He presents for post-hospital f/u. Overall feeling fair. SOB with exertion but says its a little better. +  Orthopnea. Sleeps in a chair. Denies CP. Weight has gone up from 224-235 pounds. Drinking < 2 liters per day. Taking all medications. Lives with his sister and brother in law.   R/LHC 02/12/16  Prox RCA lesion, 100% stenosed.  Prox Cx to Dist Cx lesion, 95% stenosed.  1st Mrg lesion, 100% stenosed.  2nd Mrg lesion, 100% stenosed.  Prox LAD lesion, 100% stenosed.  Ost LM to LM lesion, 50% stenosed.  Origin lesion, 100%  stenosed.  Origin lesion, 100% stenosed.  Origin lesion, 100% stenosed.  Findings: Ao = 82/58 (69) LV = 84/15/27 RA = 14 RV = 45/11/17 PA = 49/24 (31) PCW = 28 Fick cardiac output/index = 5.3/2.4 PVR = 0.6 WU SVR = 837 FA sat = 98% PA sat = 63%, 62%  02/25/2016: K 3.5 Creatinine 1.03     Past Medical History  Diagnosis Date  . ASCVD (arteriosclerotic cardiovascular disease)   . Diabetes mellitus   . Hypertension   . History of cocaine abuse   . Tobacco abuse   . Depression   . Suicidal ideation   . Chronic systolic CHF (congestive heart failure) ([place])   . COPD (chronic obstructive pulmonary disease) ([place])   . Coronary artery disease   . AICD (automatic cardioverter/defibrillator) present   . Shortness of breath dyspnea     Current Outpatient Prescriptions  Medication Sig Dispense Refill  . acetaminophen (TYLENOL) 500 MG tablet Take 1,000 mg by mouth every 6 (six) hours as needed for mild pain or headache.    . albuterol (PROVENTIL HFA;VENTOLIN HFA) 108 (90 BASE) MCG/ACT inhaler Inhale 2 puffs into the lungs every 6 (six) hours as needed for wheezing or shortness of breath.    Marland Kitchen aspirin EC 81 MG tablet Take 81 mg by mouth daily.    Marland Kitchen atorvastatin (LIPITOR) 20 MG tablet Take 1 tablet (20 mg total) by mouth every evening. 30 tablet 0  . baclofen (LIORESAL) 10  MG tablet Take 10 mg by mouth 3 (three) times daily as needed for muscle spasms.    . digoxin (LANOXIN) 0.25 MG tablet Take 1 tablet (0.25 mg total) by mouth daily. 90 tablet 1  . escitalopram (LEXAPRO) 20 MG tablet Take 1 tablet (20 mg total) by mouth daily. 30 tablet 0  . insulin aspart (NOVOLOG) 100 UNIT/ML injection Inject 0-15 Units into the skin 3 (three) times daily with meals. SLIDING SCALE    . insulin glargine (LANTUS) 100 UNIT/ML injection Inject 40 Units into the skin at bedtime.     Marland Kitchen losartan (COZAAR) 25 MG tablet Take 1 tablet (25 mg total) by mouth 2 (two) times daily. 180 tablet 1  . metolazone  (ZAROXOLYN) 5 MG tablet Take 5 mg by mouth daily as needed. Reported on 02/19/2016    . potassium chloride SA (K-DUR,KLOR-CON) 20 MEQ tablet Take 1 tablet (20 mEq total) by mouth 2 (two) times daily. 180 tablet 1  . spironolactone (ALDACTONE) 25 MG tablet Take 0.5 tablets (12.5 mg total) by mouth daily. 45 tablet 1  . torsemide (DEMADEX) 20 MG tablet Take 1-2 tablets (20-40 mg total) by mouth 2 (two) times daily. Take 2 tablets in am and 1 tablet in pm. 100 tablet 6  . zolpidem (AMBIEN) 5 MG tablet Take 1 tablet (5 mg total) by mouth at bedtime as needed for sleep. 30 tablet 0   No current facility-administered medications for this encounter.    Allergies  Allergen Reactions  . Codeine Nausea And Vomiting      Social History   Social History  . Marital Status: Single    Spouse Name: N/A  . Number of Children: N/A  . Years of Education: N/A   Occupational History  . Retired   . Truck driver    Social History Main Topics  . Smoking status: Former Smoker -- 1.50 packs/day for 23 years    Types: Cigarettes    Quit date: 08/27/2015  . Smokeless tobacco: Never Used  . Alcohol Use: No  . Drug Use: No     Comment: Former  . Sexual Activity: Not on file   Other Topics Concern  . Not on file   Social History Narrative   Divorced with one child   No regular exercise      Family History  Problem Relation Age of Onset  . CAD Father   . Diabetes Father   . Heart failure Father     Danley Danker Vitals:   03/09/16 1344  BP: 102/68  Pulse: 93  Weight: 235 lb 3.2 oz (106.686 kg)  SpO2: 95%    PHYSICAL EXAM: General:  Dyspneic walking in the clinic.  HEENT: normal Neck: supple. JVD to jaw.  Carotids 2+ bilat; no bruits. No lymphadenopathy or thyromegaly appreciated. Cor: PMI nondisplaced. RRR. No rubs, gallops or murmurs. Lungs: clear Abdomen: Obese, NT/ND. No appreciable hepatosplenomegaly. No bruits or masses. +BS Extremities: no cyanosis, clubbing. R and LLE 1+ edema.  Chronic venous stasis changes with healing wound on R shin  Neuro: alert & oriented x 3, cranial nerves grossly intact. moves all 4 extremities w/o difficulty. Affect pleasant.   ASSESSMENT & PLAN:  1. Acute/Chronic Systolic HF with severe biventricular dysfunction EF 15% due to NICM. Has medtronic ICD.  --NYHA III. Volume status elevated Optivol elevated. Weight trending up at home about 8 pounds.  Increase torsemide to 40 mg twice a day and he will take metolazone today and as needed for weight 229  pounds or greater.  Continue losartan to 25 bid. Consider entresto next visit.  --Continue digoxin - dig level 0.8 02/23/2016  - Continue 12.5 mg spironolactone daily --Hold off on carvedilol due to volume overload.  -  Will refer to cardiac rehab in [place] ([facility]) 2. CAD --severe 3v- CAD s/p CABG with occluded grafts as above except for LIMA. No targets for revascularization. No CP.  --Continue ASA, statin  3. HTN --Stable.  4. DMII --followed PCP. Stressed need to get under controlled 5. Fatty liver with ascites -- continue with diet and weight loss. Needs to get DM2 under control    Follow up on Thursday to reassess volume status. He is at high risk for readmit due to narrow euvolemic window.  Check BMET and BNP at that time. Set up CPX test once volume better controlled.    Darrick Grinder, NP-C

## 2016-03-09 NOTE — Progress Notes (Signed)
Advanced Heart Failure Medication Review by a Pharmacist  Does the patient  feel that his/her medications are working for him/her?  yes  Has the patient been experiencing any side effects to the medications prescribed?  no  Does the patient measure his/her own blood pressure or blood glucose at home?  yes   Does the patient have any problems obtaining medications due to transportation or finances?   no  Understanding of regimen: good Understanding of indications: good Potential of compliance: good Patient understands to avoid NSAIDs. Patient understands to avoid decongestants.  Issues to address at subsequent visits: None   Pharmacist comments:  Mr. Hergenreder is a pleasant 60 yo M presenting without a medication list but with a fair understanding of his regimen. He reports good compliance and did not have any specific medication-related questions or concerns for me at this time.   Ruta Hinds. Velva Harman, PharmD, BCPS, CPP Clinical Pharmacist Pager: [phone] Phone: [phone] 03/09/2016 3:21 PM      Time with patient: 10 minutes Preparation and documentation time: 2 minutes Total time: 12 minutes

## 2016-03-09 NOTE — Progress Notes (Signed)
CSW met with patient in the clinic. Patient lives at home with his sister and brother in [place]. He reports that he is compliant with medications and aware of HF diet. He states that his sister recently had an MI and is eating the same diet. He states "I probably eat a little more but we are eating the same heart healthy diet". Patient could use the services of community para medicine although lives in [facility] which does not offer the program. Patient reports he has home care services through [facility] and states there is a Therapist, sports and a PT that come weekly to his home. Patient also described what appears to be a home tele health system in his home and he enters daily weights, BP and other data. He is unclear where the data is sent and unsure which MD or program ordered the telehealth or homecare services. Patient will return home and clarify where the data is being reported. Patient has a follow up appointment in the clinic on Thursday morning. Patient verbalizes understanding and will follow up on Thursday in the clinic. CSW will continue to follow for supportive services. Raquel Sarna, LCSW [phone]

## 2016-03-09 NOTE — Patient Instructions (Signed)
INCREASE Torsemide to 40 mg, twice a day CHANGE Metolazone to as needed for weight greater than 229  Labs today  Your physician recommends that you schedule a follow-up appointment in: 1 week with PA and 2 weeks with MD  Do the following things EVERYDAY: 1) Weigh yourself in the morning before breakfast. Write it down and keep it in a log. 2) Take your medicines as prescribed 3) Eat low salt foods-Limit salt (sodium) to 2000 mg per day.  4) Stay as active as you can everyday 5) Limit all fluids for the day to less than 2 liters 6)

## 2016-03-10 ENCOUNTER — Encounter: Payer: Self-pay | Admitting: Licensed Clinical Social Worker

## 2016-03-10 ENCOUNTER — Ambulatory Visit (HOSPITAL_COMMUNITY)
Admission: RE | Admit: 2016-03-10 | Discharge: 2016-03-10 | Disposition: A | Discharge status: Home / Self Care | Payer: Medicare HMO | Source: Ambulatory Visit | Attending: Internal Medicine | Admitting: Internal Medicine

## 2016-03-10 VITALS — BP 106/66 | HR 85 | Wt 236.4 lb

## 2016-03-10 DIAGNOSIS — I5023 Acute on chronic systolic (congestive) heart failure: Secondary | ICD-10-CM | POA: Diagnosis not present

## 2016-03-10 DIAGNOSIS — I2510 Atherosclerotic heart disease of native coronary artery without angina pectoris: Secondary | ICD-10-CM | POA: Insufficient documentation

## 2016-03-10 DIAGNOSIS — I159 Secondary hypertension, unspecified: Secondary | ICD-10-CM

## 2016-03-10 DIAGNOSIS — Z951 Presence of aortocoronary bypass graft: Secondary | ICD-10-CM | POA: Insufficient documentation

## 2016-03-10 DIAGNOSIS — Z87891 Personal history of nicotine dependence: Secondary | ICD-10-CM | POA: Insufficient documentation

## 2016-03-10 DIAGNOSIS — I110 Hypertensive heart disease with heart failure: Secondary | ICD-10-CM | POA: Diagnosis not present

## 2016-03-10 DIAGNOSIS — K760 Fatty (change of) liver, not elsewhere classified: Secondary | ICD-10-CM | POA: Diagnosis not present

## 2016-03-10 DIAGNOSIS — E119 Type 2 diabetes mellitus without complications: Secondary | ICD-10-CM | POA: Insufficient documentation

## 2016-03-10 DIAGNOSIS — I5022 Chronic systolic (congestive) heart failure: Secondary | ICD-10-CM

## 2016-03-10 DIAGNOSIS — Z8249 Family history of ischemic heart disease and other diseases of the circulatory system: Secondary | ICD-10-CM | POA: Insufficient documentation

## 2016-03-10 DIAGNOSIS — Z79899 Other long term (current) drug therapy: Secondary | ICD-10-CM | POA: Diagnosis not present

## 2016-03-10 DIAGNOSIS — J449 Chronic obstructive pulmonary disease, unspecified: Secondary | ICD-10-CM | POA: Diagnosis not present

## 2016-03-10 DIAGNOSIS — F329 Major depressive disorder, single episode, unspecified: Secondary | ICD-10-CM | POA: Diagnosis not present

## 2016-03-10 DIAGNOSIS — R188 Other ascites: Secondary | ICD-10-CM | POA: Insufficient documentation

## 2016-03-10 DIAGNOSIS — Z794 Long term (current) use of insulin: Secondary | ICD-10-CM | POA: Diagnosis not present

## 2016-03-10 DIAGNOSIS — Z885 Allergy status to narcotic agent status: Secondary | ICD-10-CM | POA: Diagnosis not present

## 2016-03-10 DIAGNOSIS — Z7982 Long term (current) use of aspirin: Secondary | ICD-10-CM | POA: Diagnosis not present

## 2016-03-10 DIAGNOSIS — Z833 Family history of diabetes mellitus: Secondary | ICD-10-CM | POA: Insufficient documentation

## 2016-03-10 DIAGNOSIS — Z95810 Presence of automatic (implantable) cardiac defibrillator: Secondary | ICD-10-CM | POA: Diagnosis not present

## 2016-03-10 DIAGNOSIS — I5043 Acute on chronic combined systolic (congestive) and diastolic (congestive) heart failure: Secondary | ICD-10-CM | POA: Diagnosis not present

## 2016-03-10 DIAGNOSIS — I428 Other cardiomyopathies: Secondary | ICD-10-CM | POA: Insufficient documentation

## 2016-03-10 LAB — BASIC METABOLIC PANEL
Anion gap: 10 (ref 5–15)
BUN: 33 mg/dL — AB (ref 6–20)
CALCIUM: 8.7 mg/dL — AB (ref 8.9–10.3)
CO2: 26 mmol/L (ref 22–32)
CREATININE: 1.12 mg/dL (ref 0.61–1.24)
Chloride: 96 mmol/L — ABNORMAL LOW (ref 101–111)
GFR calc Af Amer: >60 mL/min (ref >60)
GLUCOSE: 92 mg/dL (ref 65–99)
POTASSIUM: 3.7 mmol/L (ref 3.5–5.1)
SODIUM: 132 mmol/L — AB (ref 135–145)

## 2016-03-10 LAB — BRAIN NATRIURETIC PEPTIDE: B NATRIURETIC PEPTIDE 5: 1369.8 pg/mL — AB (ref 0.0–100.0)

## 2016-03-10 MED ORDER — SPIRONOLACTONE 25 MG PO TABS: 25.0000 mg | ORAL_TABLET | Freq: Every day | ORAL | Status: DC

## 2016-03-10 MED ORDER — POTASSIUM CHLORIDE CRYS ER 20 MEQ PO TBCR
20.0000 meq | EXTENDED_RELEASE_TABLET | Freq: Once | ORAL | Status: AC
  Administered 2016-03-10: 20 meq via ORAL
  Filled 2016-03-10: qty 1

## 2016-03-10 MED ORDER — FUROSEMIDE 10 MG/ML IJ SOLN
80.0000 mg | Freq: Once | INTRAMUSCULAR | Status: AC
  Administered 2016-03-10: 80 mg via INTRAVENOUS
  Filled 2016-03-10: qty 8

## 2016-03-10 NOTE — Progress Notes (Signed)
PIV 22g in RAC started x 1 attempt. 80 mg IV lasix pushed over 2 minutes, flushed, and saline locked. 20 meq PO potassium given to patient. Patient tolerated well, remained in CHF clinic for 2 hrs to IV diurese under care of Amy Clegg NP-C, urinal and call bell within reach. Total urinary output: 550 cc clear yellow nonodorous urine. PIV DC'd and clean dry gauze bandage applied before patient discharged from clinic with follow up appointment tomorrow and instructions.  Renee Pain

## 2016-03-10 NOTE — Progress Notes (Signed)
Patient ID: Ronald Miller., male   DOB: [birth year], 60 y.o.   MRN: [id number]    Advanced Heart Failure Clinic Note   Referring Physician: Darylene Price, FNP Primary Care: [facility] Primary Cardiologist: Dr Haroldine Laws.   HPI:  Ronald Miller. is a 60 y.o. male with history of chronic systolic HF s/p Medtronic ICD 2014, CAD s/p CABG in 2000, HTN, Hx of cocaine abuse, Tobacco abuse, Depression, and COPD.   Echo 10/14/15 LVEF 20-25%, RV mildly dilated.   Admitted to [facility] from 4/24 - [id number] with A/C systolic HF.  Had a US guided Paracentesis with 5 L removed. They decreased his home lasix to 40 mg daily due to low blood pressure. BB held as well with bradycardia and hypotension.  Discharge weight 239 lbs  Admitted 5/17 with volume overload and CP. Echo 02/10/16 LVEF 15%, Mild MR, Mod LAE, Severe RAE, severely reduced RV function, PA peak pressure 31 mm Hg. Underwent paracentesis 02/10/16 with 5.2 L out. Ultrasound showed severe fatty liver. Had [facility] yesterday with severe native 3v CAD, All grafts occluded except LIMA to LAD with faint L to R collaterals, elevated filling pressures with normal cardiac output. No targets for revascularization. Weight on d/c 247 -> 219 pounds.   Admitted 5/22 through 5/24 with chest pain and dyspnea. Diuresed with IV lasix and transitioned to torsemide 40 /20 daily. Discharge weight 228 pounds.   Today he presented for an acute work in due to increased dyspnea. He was evaluated yesterday and instructed to increase torsemide to 40 mg twice day and take metolazone. Says his weight went up from 235 to 236 pounds. Last night he was eating chex mix. Not sleep well. + Orthopnea. Denies PND.  Drinking < 2 liters per day. Taking all medications. Lives with his sister and brother in law. Followed by [facility] in the community.   R/LHC 02/12/16  Prox RCA lesion, 100% stenosed.  Prox Cx to Dist Cx lesion, 95% stenosed.  1st Mrg lesion, 100%  stenosed.  2nd Mrg lesion, 100% stenosed.  Prox LAD lesion, 100% stenosed.  Ost LM to LM lesion, 50% stenosed.  Origin lesion, 100% stenosed.  Origin lesion, 100% stenosed.  Origin lesion, 100% stenosed.  Findings: Ao = 82/58 (69) LV = 84/15/27 RA = 14 RV = 45/11/17 PA = 49/24 (31) PCW = 28 Fick cardiac output/index = 5.3/2.4 PVR = 0.6 WU SVR = 837 FA sat = 98% PA sat = 63%, 62%  02/25/2016: K 3.5 Creatinine 1.03     Past Medical History  Diagnosis Date  . ASCVD (arteriosclerotic cardiovascular disease)   . Diabetes mellitus   . Hypertension   . History of cocaine abuse   . Tobacco abuse   . Depression   . Suicidal ideation   . Chronic systolic CHF (congestive heart failure) ([place])   . COPD (chronic obstructive pulmonary disease) ([place])   . Coronary artery disease   . AICD (automatic cardioverter/defibrillator) present   . Shortness of breath dyspnea     Current Outpatient Prescriptions  Medication Sig Dispense Refill  . acetaminophen (TYLENOL) 500 MG tablet Take 1,000 mg by mouth every 6 (six) hours as needed for mild pain or headache.    . albuterol (PROVENTIL HFA;VENTOLIN HFA) 108 (90 BASE) MCG/ACT inhaler Inhale 2 puffs into the lungs every 6 (six) hours as needed for wheezing or shortness of breath.    Marland Kitchen aspirin EC 81 MG tablet Take 81 mg by mouth daily.    Marland Kitchen  atorvastatin (LIPITOR) 20 MG tablet Take 1 tablet (20 mg total) by mouth every evening. 30 tablet 0  . baclofen (LIORESAL) 10 MG tablet Take 10 mg by mouth 3 (three) times daily as needed for muscle spasms.    . digoxin (LANOXIN) 0.25 MG tablet Take 1 tablet (0.25 mg total) by mouth daily. 90 tablet 1  . escitalopram (LEXAPRO) 20 MG tablet Take 1 tablet (20 mg total) by mouth daily. 30 tablet 0  . insulin aspart (NOVOLOG) 100 UNIT/ML injection Inject 0-15 Units into the skin 3 (three) times daily with meals. SLIDING SCALE    . insulin glargine (LANTUS) 100 UNIT/ML injection Inject 40 Units into the  skin at bedtime.     Marland Kitchen losartan (COZAAR) 25 MG tablet Take 1 tablet (25 mg total) by mouth 2 (two) times daily. 180 tablet 1  . metolazone (ZAROXOLYN) 5 MG tablet Take 1 tablet (5 mg total) by mouth daily as needed (weight greater than 229). Reported on 02/19/2016 15 tablet 3  . potassium chloride SA (K-DUR,KLOR-CON) 20 MEQ tablet Take 1 tablet (20 mEq total) by mouth 2 (two) times daily. 180 tablet 1  . spironolactone (ALDACTONE) 25 MG tablet Take 0.5 tablets (12.5 mg total) by mouth daily. 45 tablet 1  . torsemide (DEMADEX) 20 MG tablet Take 2 tablets (40 mg total) by mouth 2 (two) times daily. 120 tablet 6  . zolpidem (AMBIEN) 5 MG tablet Take 1 tablet (5 mg total) by mouth at bedtime as needed for sleep. 30 tablet 0   No current facility-administered medications for this encounter.    Allergies  Allergen Reactions  . Codeine Nausea And Vomiting      Social History   Social History  . Marital Status: Single    Spouse Name: N/A  . Number of Children: N/A  . Years of Education: N/A   Occupational History  . Retired   . Truck driver    Social History Main Topics  . Smoking status: Former Smoker -- 1.50 packs/day for 23 years    Types: Cigarettes    Quit date: 08/27/2015  . Smokeless tobacco: Never Used  . Alcohol Use: No  . Drug Use: No     Comment: Former  . Sexual Activity: Not on file   Other Topics Concern  . Not on file   Social History Narrative   Divorced with one child   No regular exercise      Family History  Problem Relation Age of Onset  . CAD Father   . Diabetes Father   . Heart failure Father     Filed Vitals:   03/10/16 0918  BP: 106/66  Pulse: 85  Weight: 236 lb 6.4 oz (107.23 kg)  SpO2: 95%    PHYSICAL EXAM: General:  Dyspneic walking in the clinic.  HEENT: normal Neck: supple. JVD to jaw.  Carotids 2+ bilat; no bruits. No lymphadenopathy or thyromegaly appreciated. Cor: PMI nondisplaced. RRR. No rubs, gallops or murmurs. Lungs:  clear Abdomen: Obese, distended. No appreciable hepatosplenomegaly. No bruits or masses. +BS Extremities: no cyanosis, clubbing. R and LLE 1+ edema. Chronic venous stasis changes with healing wound on R shin  Neuro: alert & oriented x 3, cranial nerves grossly intact. moves all 4 extremities w/o difficulty. Affect pleasant.   ASSESSMENT & PLAN:  1. Acute/Chronic Systolic HF with severe biventricular dysfunction EF 15% due to NICM. Has medtronic ICD.  --NYHA III.-IV.  Volume status elevated today despite metolazone last night but had salty food  last night. Give 80 mg IV lasix + 20 meq of potassium . Voided 700cc of urine. BNP coming down still overloaded.   Continue  torsemide to 40 mg twice a day.   Continue losartan to 25 bid. Consider entresto next visit.  --Continue digoxin - dig level 0.8 02/23/2016  - Increase spiro to 25 mg daily.  --Hold off on carvedilol due to volume overload.  -  Will refer to cardiac rehab in [place] ([facility]) - I have asked him to eat low salt foods and limit fluid intake < 2 liters.  2. CAD --severe 3v- CAD s/p CABG with occluded grafts as above except for LIMA. No targets for revascularization. No CP.  --Continue ASA, statin  3. HTN --Stable.  4. DMII --followed PCP. Stressed need to get under controlled 5. Fatty liver with ascites -- continue with diet and weight loss. Needs to get DM2 under control 6. Ascites- Most recent paracentesis 02/10/2016 with 5.2 liters removed. Set up for paracentesis tomorrow.    BMET and BNP. Follow up tomorrow to reassess. Referred to HFSW for PCP    Amy Ninfa Meeker, NP-C

## 2016-03-10 NOTE — Patient Instructions (Signed)
INCREASE Spironolactone to 25 mg, onw tab daily  You have been referred to [facility] for a paracentesis  03/11/16 @ 1030 am, 1st Floor admitting  Your physician recommends that you schedule a follow-up appointment as scheduled 03/11/16 @ 840Am  Do the following things EVERYDAY: 1) Weigh yourself in the morning before breakfast. Write it down and keep it in a log. 2) Take your medicines as prescribed 3) Eat low salt foods-Limit salt (sodium) to 2000 mg per day.  4) Stay as active as you can everyday 5) Limit all fluids for the day to less than 2 liters 6)

## 2016-03-10 NOTE — Progress Notes (Signed)
Patient came into clinic today due to increased SOB. Patient states he ate chex mix last night which probably contributed to his increased SOB. Patient states he needs a PCP and was referred to IM clinic per his request. Patient stated he was feeling a bit better and was going home to rest. CSW provided support and will continue to follow for support and encouragement for compliance with HF regimen. Raquel Sarna, LCSW [phone]

## 2016-03-11 ENCOUNTER — Ambulatory Visit (HOSPITAL_COMMUNITY)
Admission: RE | Admit: 2016-03-11 | Discharge: 2016-03-11 | Disposition: A | Discharge status: Home / Self Care | Payer: Medicare HMO | Source: Ambulatory Visit | Attending: Adult Health | Admitting: Adult Health

## 2016-03-11 ENCOUNTER — Other Ambulatory Visit (HOSPITAL_COMMUNITY): Payer: Self-pay | Admitting: Adult Health

## 2016-03-11 ENCOUNTER — Ambulatory Visit (HOSPITAL_COMMUNITY)
Admission: RE | Admit: 2016-03-11 | Discharge: 2016-03-11 | Disposition: A | Discharge status: Home / Self Care | Payer: Medicare HMO | Source: Ambulatory Visit | Attending: Cardiology | Admitting: Cardiology

## 2016-03-11 VITALS — BP 88/58 | HR 59 | Wt 235.2 lb

## 2016-03-11 DIAGNOSIS — Z8249 Family history of ischemic heart disease and other diseases of the circulatory system: Secondary | ICD-10-CM | POA: Insufficient documentation

## 2016-03-11 DIAGNOSIS — I25810 Atherosclerosis of coronary artery bypass graft(s) without angina pectoris: Secondary | ICD-10-CM | POA: Diagnosis not present

## 2016-03-11 DIAGNOSIS — I110 Hypertensive heart disease with heart failure: Secondary | ICD-10-CM | POA: Diagnosis not present

## 2016-03-11 DIAGNOSIS — Z951 Presence of aortocoronary bypass graft: Secondary | ICD-10-CM | POA: Diagnosis not present

## 2016-03-11 DIAGNOSIS — E119 Type 2 diabetes mellitus without complications: Secondary | ICD-10-CM | POA: Insufficient documentation

## 2016-03-11 DIAGNOSIS — I5022 Chronic systolic (congestive) heart failure: Secondary | ICD-10-CM | POA: Diagnosis not present

## 2016-03-11 DIAGNOSIS — K760 Fatty (change of) liver, not elsewhere classified: Secondary | ICD-10-CM | POA: Diagnosis not present

## 2016-03-11 DIAGNOSIS — F329 Major depressive disorder, single episode, unspecified: Secondary | ICD-10-CM | POA: Insufficient documentation

## 2016-03-11 DIAGNOSIS — I428 Other cardiomyopathies: Secondary | ICD-10-CM | POA: Diagnosis not present

## 2016-03-11 DIAGNOSIS — Z833 Family history of diabetes mellitus: Secondary | ICD-10-CM | POA: Diagnosis not present

## 2016-03-11 DIAGNOSIS — Z794 Long term (current) use of insulin: Secondary | ICD-10-CM | POA: Diagnosis not present

## 2016-03-11 DIAGNOSIS — R188 Other ascites: Secondary | ICD-10-CM | POA: Diagnosis not present

## 2016-03-11 DIAGNOSIS — Z87891 Personal history of nicotine dependence: Secondary | ICD-10-CM | POA: Insufficient documentation

## 2016-03-11 DIAGNOSIS — Z7982 Long term (current) use of aspirin: Secondary | ICD-10-CM | POA: Diagnosis not present

## 2016-03-11 DIAGNOSIS — I5023 Acute on chronic systolic (congestive) heart failure: Secondary | ICD-10-CM

## 2016-03-11 DIAGNOSIS — J449 Chronic obstructive pulmonary disease, unspecified: Secondary | ICD-10-CM | POA: Diagnosis not present

## 2016-03-11 DIAGNOSIS — Z95810 Presence of automatic (implantable) cardiac defibrillator: Secondary | ICD-10-CM | POA: Insufficient documentation

## 2016-03-11 DIAGNOSIS — Z885 Allergy status to narcotic agent status: Secondary | ICD-10-CM | POA: Insufficient documentation

## 2016-03-11 DIAGNOSIS — Z79899 Other long term (current) drug therapy: Secondary | ICD-10-CM | POA: Insufficient documentation

## 2016-03-11 NOTE — Progress Notes (Signed)
Patient ID: Ronald Totty., male   DOB: 03-23-1956, 60 y.o.   MRN: [id number]    Advanced Heart Failure Clinic Note   Referring Physician: Darylene Price, FNP Primary Care: [facility] Primary Cardiologist: Dr Haroldine Laws.   HPI:  Ronald Miller. is a 60 y.o. male with history of chronic systolic HF s/p Medtronic ICD 2014, CAD s/p CABG in 2000, HTN, Hx of cocaine abuse, Tobacco abuse, Depression, and COPD.   Echo 10/14/15 LVEF 20-25%, RV mildly dilated.   Admitted to [facility] from 4/24 - [id number] with A/C systolic HF.  Had a US guided Paracentesis with 5 L removed. They decreased his home lasix to 40 mg daily due to low blood pressure. BB held as well with bradycardia and hypotension.  Discharge weight 239 lbs  Admitted 5/17 with volume overload and CP. Echo 02/10/16 LVEF 15%, Mild MR, Mod LAE, Severe RAE, severely reduced RV function, PA peak pressure 31 mm Hg. Underwent paracentesis 02/10/16 with 5.2 L out. Ultrasound showed severe fatty liver. Had [facility] yesterday with severe native 3v CAD, All grafts occluded except LIMA to LAD with faint L to R collaterals, elevated filling pressures with normal cardiac output. No targets for revascularization. Weight on d/c 247 -> 219 pounds.   Admitted 5/22 through 5/24 with chest pain and dyspnea. Diuresed with IV lasix and transitioned to torsemide 40 /20 daily. Discharge weight 228 pounds.   Today he presents for HF follow up. Yesterday he received IV lasix in the HF clinic. Today he is feeling much better. Weight at home has gone down from 232 to 231 pounds. Able to rest last night. Able to sleep in the bed last night. Slept on 2 small pillows. Drinking < 2 liters per day. Taking all medications. Lives with his sister and brother in law. Followed by [facility] in the community.   R/LHC 02/12/16  Prox RCA lesion, 100% stenosed.  Prox Cx to Dist Cx lesion, 95% stenosed.  1st Mrg lesion, 100% stenosed.  2nd Mrg lesion, 100%  stenosed.  Prox LAD lesion, 100% stenosed.  Ost LM to LM lesion, 50% stenosed.  Origin lesion, 100% stenosed.  Origin lesion, 100% stenosed.  Origin lesion, 100% stenosed.  Findings: Ao = 82/58 (69) LV = 84/15/27 RA = 14 RV = 45/11/17 PA = 49/24 (31) PCW = 28 Fick cardiac output/index = 5.3/2.4 PVR = 0.6 WU SVR = 837 FA sat = 98% PA sat = 63%, 62%  02/25/2016: K 3.5 Creatinine 1.03  03/10/2016: K 3.7 Creatinine 1.12     Past Medical History  Diagnosis Date  . ASCVD (arteriosclerotic cardiovascular disease)   . Diabetes mellitus   . Hypertension   . History of cocaine abuse   . Tobacco abuse   . Depression   . Suicidal ideation   . Chronic systolic CHF (congestive heart failure) ([place])   . COPD (chronic obstructive pulmonary disease) ([place])   . Coronary artery disease   . AICD (automatic cardioverter/defibrillator) present   . Shortness of breath dyspnea     Current Outpatient Prescriptions  Medication Sig Dispense Refill  . acetaminophen (TYLENOL) 500 MG tablet Take 1,000 mg by mouth every 6 (six) hours as needed for mild pain or headache.    . albuterol (PROVENTIL HFA;VENTOLIN HFA) 108 (90 BASE) MCG/ACT inhaler Inhale 2 puffs into the lungs every 6 (six) hours as needed for wheezing or shortness of breath.    Marland Kitchen aspirin EC 81 MG tablet Take 81 mg  by mouth daily.    Marland Kitchen atorvastatin (LIPITOR) 20 MG tablet Take 1 tablet (20 mg total) by mouth every evening. 30 tablet 0  . baclofen (LIORESAL) 10 MG tablet Take 10 mg by mouth 3 (three) times daily as needed for muscle spasms.    . digoxin (LANOXIN) 0.25 MG tablet Take 1 tablet (0.25 mg total) by mouth daily. 90 tablet 1  . escitalopram (LEXAPRO) 20 MG tablet Take 1 tablet (20 mg total) by mouth daily. 30 tablet 0  . insulin aspart (NOVOLOG) 100 UNIT/ML injection Inject 0-15 Units into the skin 3 (three) times daily with meals. SLIDING SCALE    . insulin glargine (LANTUS) 100 UNIT/ML injection Inject 40 Units into the  skin at bedtime.     Marland Kitchen losartan (COZAAR) 25 MG tablet Take 1 tablet (25 mg total) by mouth 2 (two) times daily. 180 tablet 1  . metolazone (ZAROXOLYN) 5 MG tablet Take 1 tablet (5 mg total) by mouth daily as needed (weight greater than 229). Reported on 02/19/2016 15 tablet 3  . potassium chloride SA (K-DUR,KLOR-CON) 20 MEQ tablet Take 1 tablet (20 mEq total) by mouth 2 (two) times daily. 180 tablet 1  . spironolactone (ALDACTONE) 25 MG tablet Take 1 tablet (25 mg total) by mouth daily. 90 tablet 1  . torsemide (DEMADEX) 20 MG tablet Take 2 tablets (40 mg total) by mouth 2 (two) times daily. 120 tablet 6  . zolpidem (AMBIEN) 5 MG tablet Take 1 tablet (5 mg total) by mouth at bedtime as needed for sleep. 30 tablet 0   No current facility-administered medications for this encounter.    Allergies  Allergen Reactions  . Codeine Nausea And Vomiting      Social History   Social History  . Marital Status: Single    Spouse Name: N/A  . Number of Children: N/A  . Years of Education: N/A   Occupational History  . Retired   . Truck driver    Social History Main Topics  . Smoking status: Former Smoker -- 1.50 packs/day for 23 years    Types: Cigarettes    Quit date: 08/27/2015  . Smokeless tobacco: Never Used  . Alcohol Use: No  . Drug Use: No     Comment: Former  . Sexual Activity: Not on file   Other Topics Concern  . Not on file   Social History Narrative   Divorced with one child   No regular exercise      Family History  Problem Relation Age of Onset  . CAD Father   . Diabetes Father   . Heart failure Father     Filed Vitals:   03/11/16 0859  BP: 88/58  Pulse: 59  Weight: 235 lb 3.2 oz (106.686 kg)  SpO2: 95%    PHYSICAL EXAM: General:  Dyspneic walking in the clinic.  HEENT: normal Neck: supple. JVD 10-11 .  Carotids 2+ bilat; no bruits. No lymphadenopathy or thyromegaly appreciated. Cor: PMI nondisplaced. RRR. No rubs, gallops or murmurs. Lungs:  clear Abdomen: Obese, distended. No appreciable hepatosplenomegaly. No bruits or masses. +BS Extremities: no cyanosis, clubbing. R and LLE Chronic venous stasis changes with healing wound on R shin  Neuro: alert & oriented x 3, cranial nerves grossly intact. moves all 4 extremities w/o difficulty. Affect pleasant.   ASSESSMENT & PLAN:  1. Chronic Systolic HF with severe biventricular dysfunction EF 15% due to NICM. Has medtronic ICD.  --NYHA III.  Volume status improved after IV lasix but  still overloaded. Continue  torsemide to 40 mg twice a day.   Continue losartan to 25 bid. Consider entresto next visit.  --Continue digoxin - dig level 0.8 02/23/2016  -Continue spiro to 25 mg daily.  --Hold off on carvedilol due to volume overload.  -  Will refer to cardiac rehab in [place] ([facility]) - Instructed to follow low salt diet and limtiing fluids to 2 liters.   2. CAD --severe 3v- CAD s/p CABG with occluded grafts as above except for LIMA. No targets for revascularization. No CP.  --Continue ASA, statin  3. HTN --Stable.  4. DMII --followed PCP. Stressed need to get under controlled 5. Fatty liver with ascites -- continue with diet and weight loss. Needs to get DM2 under control 6. Ascites- Most recent paracentesis 02/10/2016 with 5.2 liters removed. Planning for  paracentesis today.     Follow up next week to reassess volume status. BMET    Sindia Kowalczyk Ninfa Meeker, NP-C

## 2016-03-11 NOTE — Patient Instructions (Signed)
Your physician recommends that you schedule a follow-up appointment as scheduled  

## 2016-03-11 NOTE — Progress Notes (Signed)
Patient ID: Ronald Miller., male   DOB: June 25, 1956, 60 y.o.   MRN: [id number] Pt presented to [place] dept today for paracentesis . On limited US abd in all four quadrants there is no significant fluid noted which is safely accessible to drain at this time . Procedure cancelled. Pt informed.

## 2016-03-16 ENCOUNTER — Telehealth (HOSPITAL_COMMUNITY): Payer: Self-pay | Admitting: *Deleted

## 2016-03-16 NOTE — Telephone Encounter (Signed)
Montine Circle, NP called to discuss pt w/Dr Bensimhon, pt had chest pain that lasted for 5 minutes while she was seeing him today.  Dr Haroldine Laws discussed w/her, she will be starting pt on Imdur 30 mg daily, med added to med list

## 2016-03-16 NOTE — Progress Notes (Signed)
Patient ID: Ronald Miller., male   DOB: [birth year], 60 y.o.   MRN: [id number]    Advanced Heart Failure Clinic Note   Referring Physician: Darylene Price, FNP Primary Care: [facility] Primary Cardiologist: Dr Haroldine Laws.   HPI:  Ronald Miller. is a 60 y.o. male with history of chronic systolic HF s/p Medtronic ICD 2014, CAD s/p CABG in 2000, HTN, Hx of cocaine abuse, Tobacco abuse, Depression, and COPD.   Echo 10/14/15 LVEF 20-25%, RV mildly dilated.   Admitted to [facility] from 4/24 - [id number] with A/C systolic HF.  Had a US guided Paracentesis with 5 L removed. They decreased his home lasix to 40 mg daily due to low blood pressure. BB held as well with bradycardia and hypotension.  Discharge weight 239 lbs  Admitted 5/17 with volume overload and CP. Echo 02/10/16 LVEF 15%, Mild MR, Mod LAE, Severe RAE, severely reduced RV function, PA peak pressure 31 mm Hg. Underwent paracentesis 02/10/16 with 5.2 L out. Ultrasound showed severe fatty liver. Had [facility] yesterday with severe native 3v CAD, All grafts occluded except LIMA to LAD with faint L to R collaterals, elevated filling pressures with normal cardiac output. No targets for revascularization. Weight on d/c 247 -> 219 pounds.   Admitted 5/22 through 5/24 with chest pain and dyspnea. Diuresed with IV lasix and transitioned to torsemide 40 /20 daily. Discharge weight 228 pounds.   He presents for regular HF follow up. Received IV lasix in clinic last week, and felt much better. Imdur added to his medicines 03/16/16 after having chest pain during an office visit with Rolland Porter, NP. Pt states he didn't receive any new medicine and pharmacy didn't have anything. CP has gotten much better, over the past few days. Sometimes also hurts in his back.  [place] last week without sufficient ascites for paracentesis. Weight at home this am down to 229, sleeping "a whole lot better".  Drinking < 2 liters per day. Taking all medications. Lives with  his sister and brother in law. Followed by [name] as well as [facility] at home.   R/LHC 02/12/16  Prox RCA lesion, 100% stenosed.  Prox Cx to Dist Cx lesion, 95% stenosed.  1st Mrg lesion, 100% stenosed.  2nd Mrg lesion, 100% stenosed.  Prox LAD lesion, 100% stenosed.  Ost LM to LM lesion, 50% stenosed.  Origin lesion, 100% stenosed.  Origin lesion, 100% stenosed.  Origin lesion, 100% stenosed.  Findings: Ao = 82/58 (69) LV = 84/15/27 RA = 14 RV = 45/11/17 PA = 49/24 (31) PCW = 28 Fick cardiac output/index = 5.3/2.4 PVR = 0.6 WU SVR = 837 FA sat = 98% PA sat = 63%, 62%  02/25/2016: K 3.5 Creatinine 1.03  03/10/2016: K 3.7 Creatinine 1.12   Past Medical History  Diagnosis Date  . ASCVD (arteriosclerotic cardiovascular disease)   . Diabetes mellitus   . Hypertension   . History of cocaine abuse   . Tobacco abuse   . Depression   . Suicidal ideation   . Chronic systolic CHF (congestive heart failure) ([place])   . COPD (chronic obstructive pulmonary disease) ([place])   . Coronary artery disease   . AICD (automatic cardioverter/defibrillator) present   . Shortness of breath dyspnea     Current Outpatient Prescriptions  Medication Sig Dispense Refill  . acetaminophen (TYLENOL) 500 MG tablet Take 1,000 mg by mouth every 6 (six) hours as needed for mild pain or headache.    Marland Kitchen  albuterol (PROVENTIL HFA;VENTOLIN HFA) 108 (90 BASE) MCG/ACT inhaler Inhale 2 puffs into the lungs every 6 (six) hours as needed for wheezing or shortness of breath.    Marland Kitchen aspirin EC 81 MG tablet Take 81 mg by mouth daily.    Marland Kitchen atorvastatin (LIPITOR) 20 MG tablet Take 1 tablet (20 mg total) by mouth every evening. 30 tablet 0  . baclofen (LIORESAL) 10 MG tablet Take 10 mg by mouth 3 (three) times daily as needed for muscle spasms.    . digoxin (LANOXIN) 0.25 MG tablet Take 1 tablet (0.25 mg total) by mouth daily. 90 tablet 1  . escitalopram (LEXAPRO) 20 MG tablet Take 1 tablet (20 mg  total) by mouth daily. 30 tablet 0  . insulin aspart (NOVOLOG) 100 UNIT/ML injection Inject 0-15 Units into the skin 3 (three) times daily with meals. SLIDING SCALE    . insulin glargine (LANTUS) 100 UNIT/ML injection Inject 40 Units into the skin at bedtime.     . isosorbide mononitrate (IMDUR) 30 MG 24 hr tablet Take 30 mg by mouth daily.    Marland Kitchen losartan (COZAAR) 25 MG tablet Take 1 tablet (25 mg total) by mouth 2 (two) times daily. 180 tablet 1  . metolazone (ZAROXOLYN) 5 MG tablet Take 1 tablet (5 mg total) by mouth daily as needed (weight greater than 229). Reported on 02/19/2016 15 tablet 3  . potassium chloride SA (K-DUR,KLOR-CON) 20 MEQ tablet Take 1 tablet (20 mEq total) by mouth 2 (two) times daily. 180 tablet 1  . spironolactone (ALDACTONE) 25 MG tablet Take 1 tablet (25 mg total) by mouth daily. 90 tablet 1  . torsemide (DEMADEX) 20 MG tablet Take 2 tablets (40 mg total) by mouth 2 (two) times daily. 120 tablet 6  . zolpidem (AMBIEN) 5 MG tablet Take 1 tablet (5 mg total) by mouth at bedtime as needed for sleep. 30 tablet 0   No current facility-administered medications for this encounter.    Allergies  Allergen Reactions  . Codeine Nausea And Vomiting      Social History   Social History  . Marital Status: Single    Spouse Name: N/A  . Number of Children: N/A  . Years of Education: N/A   Occupational History  . Retired   . Truck driver    Social History Main Topics  . Smoking status: Former Smoker -- 1.50 packs/day for 23 years    Types: Cigarettes    Quit date: 08/27/2015  . Smokeless tobacco: Never Used  . Alcohol Use: No  . Drug Use: No     Comment: Former  . Sexual Activity: Not on file   Other Topics Concern  . Not on file   Social History Narrative   Divorced with one child   No regular exercise      Family History  Problem Relation Age of Onset  . CAD Father   . Diabetes Father   . Heart failure Father     Filed Vitals:   03/17/16 1111  BP:  110/80  Pulse: 100  Weight: 232 lb 6.4 oz (105.416 kg)  SpO2: 94%     Wt Readings from Last 3 Encounters:  03/17/16 232 lb 6.4 oz (105.416 kg)  03/11/16 235 lb 3.2 oz (106.686 kg)  03/10/16 236 lb 6.4 oz (107.23 kg)     PHYSICAL EXAM: General:  Dyspneic walking in the clinic.  HEENT: normal Neck: supple. JVD 6-7.  Carotids 2+ bilat; no bruits. No thyromegaly or nodule noted.  Cor: PMI nondisplaced. RRR. No M/G/R Lungs: CTAB, normal effort Abdomen: Obese, NT, mildly distended, no HSM. No bruits or masses. +BS  Extremities: no cyanosis, clubbing. R and LLE Chronic venous stasis changes with healing wound on R shin  Neuro: alert & oriented x 3, cranial nerves grossly intact. moves all 4 extremities w/o difficulty. Affect pleasant.   ASSESSMENT & PLAN:  1. Chronic Systolic HF with severe biventricular dysfunction EF 15% due to NICM. Has medtronic ICD.  --NYHA III.  Volume status improved after IV lasix but still overloaded. Continue  torsemide 40 mg twice a day.   - Continue losartan 25 bid. Will hold off of Entresto for now with softer BP and ongoing diuretic adjustment.   - Unclear if imdur 30 mg started yesterday. Will make sure it is ordered and he starts.  - Continue digoxin - dig level 0.8 02/23/2016  - Continue spiro 25 mg daily.  - Hold off on carvedilol with soft pressure and starting imdur as above.  - Re-refer to cardiac rehab in [place] ([facility]) - Instructed to follow low salt diet and limtiing fluids to 2 liters.   2. CAD --severe 3v- CAD s/p CABG with occluded grafts as above except for LIMA. No targets for revascularization. No CP.  --Continue ASA, statin  3. HTN --Stable.  4. DMII --followed PCP. Stressed need to get under controlled 5. Fatty liver with ascites -- continue with diet and weight loss. Needs to get DM2 under control 6. Ascites- Most recent paracentesis 02/10/2016 with 5.2 liters removed. [place] last week without sufficient ascites for paracentesis.     Keep appt for next week with Dr. Haroldine Laws. BMET today with recent changes. Adding imdur as above.    [name] Friar, NP-C

## 2016-03-17 ENCOUNTER — Ambulatory Visit (HOSPITAL_BASED_OUTPATIENT_CLINIC_OR_DEPARTMENT_OTHER)
Admission: RE | Admit: 2016-03-17 | Discharge: 2016-03-17 | Disposition: A | Discharge status: Home / Self Care | Payer: Medicare HMO | Source: Ambulatory Visit | Attending: Internal Medicine | Admitting: Internal Medicine

## 2016-03-17 VITALS — BP 110/80 | HR 100 | Wt 232.4 lb

## 2016-03-17 DIAGNOSIS — F329 Major depressive disorder, single episode, unspecified: Secondary | ICD-10-CM | POA: Insufficient documentation

## 2016-03-17 DIAGNOSIS — Z833 Family history of diabetes mellitus: Secondary | ICD-10-CM

## 2016-03-17 DIAGNOSIS — Z885 Allergy status to narcotic agent status: Secondary | ICD-10-CM | POA: Insufficient documentation

## 2016-03-17 DIAGNOSIS — R188 Other ascites: Secondary | ICD-10-CM

## 2016-03-17 DIAGNOSIS — J449 Chronic obstructive pulmonary disease, unspecified: Secondary | ICD-10-CM

## 2016-03-17 DIAGNOSIS — Z8249 Family history of ischemic heart disease and other diseases of the circulatory system: Secondary | ICD-10-CM

## 2016-03-17 DIAGNOSIS — I25810 Atherosclerosis of coronary artery bypass graft(s) without angina pectoris: Secondary | ICD-10-CM | POA: Insufficient documentation

## 2016-03-17 DIAGNOSIS — I110 Hypertensive heart disease with heart failure: Secondary | ICD-10-CM

## 2016-03-17 DIAGNOSIS — Z79899 Other long term (current) drug therapy: Secondary | ICD-10-CM | POA: Insufficient documentation

## 2016-03-17 DIAGNOSIS — Z7982 Long term (current) use of aspirin: Secondary | ICD-10-CM

## 2016-03-17 DIAGNOSIS — E119 Type 2 diabetes mellitus without complications: Secondary | ICD-10-CM

## 2016-03-17 DIAGNOSIS — I5022 Chronic systolic (congestive) heart failure: Secondary | ICD-10-CM

## 2016-03-17 DIAGNOSIS — Z87891 Personal history of nicotine dependence: Secondary | ICD-10-CM

## 2016-03-17 DIAGNOSIS — I159 Secondary hypertension, unspecified: Secondary | ICD-10-CM | POA: Diagnosis not present

## 2016-03-17 DIAGNOSIS — Z95810 Presence of automatic (implantable) cardiac defibrillator: Secondary | ICD-10-CM

## 2016-03-17 DIAGNOSIS — I428 Other cardiomyopathies: Secondary | ICD-10-CM | POA: Insufficient documentation

## 2016-03-17 DIAGNOSIS — K760 Fatty (change of) liver, not elsewhere classified: Secondary | ICD-10-CM | POA: Insufficient documentation

## 2016-03-17 DIAGNOSIS — Z794 Long term (current) use of insulin: Secondary | ICD-10-CM

## 2016-03-17 LAB — BASIC METABOLIC PANEL
ANION GAP: 9 (ref 5–15)
BUN: 37 mg/dL — AB (ref 6–20)
CALCIUM: 9.5 mg/dL (ref 8.9–10.3)
CO2: 34 mmol/L — AB (ref 22–32)
Chloride: 90 mmol/L — ABNORMAL LOW (ref 101–111)
Creatinine, Ser: 1.2 mg/dL (ref 0.61–1.24)
GFR calc Af Amer: >60 mL/min (ref >60)
GFR calc non Af Amer: >60 mL/min (ref >60)
GLUCOSE: 219 mg/dL — AB (ref 65–99)
POTASSIUM: 3.5 mmol/L (ref 3.5–5.1)
Sodium: 133 mmol/L — ABNORMAL LOW (ref 135–145)

## 2016-03-17 MED ORDER — ISOSORBIDE MONONITRATE ER 30 MG PO TB24: 30.0000 mg | ORAL_TABLET | Freq: Every day | ORAL | Status: DC

## 2016-03-17 NOTE — Patient Instructions (Signed)
Labs today  Your physician recommends that you schedule a follow-up appointment as scheduled next week  Do the following things EVERYDAY: 1) Weigh yourself in the morning before breakfast. Write it down and keep it in a log. 2) Take your medicines as prescribed 3) Eat low salt foods-Limit salt (sodium) to 2000 mg per day.  4) Stay as active as you can everyday 5) Limit all fluids for the day to less than 2 liters 6)

## 2016-03-17 NOTE — Progress Notes (Signed)
Advanced Heart Failure Medication Review by a Pharmacist  Does the patient  feel that his/her medications are working for him/her?  yes  Has the patient been experiencing any side effects to the medications prescribed?  no  Does the patient measure his/her own blood pressure or blood glucose at home?  yes   Does the patient have any problems obtaining medications due to transportation or finances?   no  Understanding of regimen: fair Understanding of indications: fair Potential of compliance: good Patient understands to avoid NSAIDs. Patient understands to avoid decongestants.  Pharmacist comments: Ronald Miller is a 50 yom presenting to the clinic for follow-up. He states that he is not experiencing any medication-related side effects and no medication-related questions at this time. A new prescription for imdur will be called in for him today.   [name] C. Lennox Grumbles, PharmD Pharmacy Resident  Pager: [phone] 03/17/2016 11:59 AM     Time with patient: 10 min  Preparation and documentation time: 2 min  Total time: 12 min

## 2016-03-19 ENCOUNTER — Inpatient Hospital Stay (HOSPITAL_COMMUNITY)
Admission: EM | Admit: 2016-03-19 | Discharge: 2016-03-25 | DRG: 286 | Disposition: A | Discharge status: Home Health | Payer: Medicare HMO | Attending: Internal Medicine | Admitting: Internal Medicine

## 2016-03-19 ENCOUNTER — Emergency Department (HOSPITAL_COMMUNITY): Payer: Medicare HMO

## 2016-03-19 ENCOUNTER — Encounter (HOSPITAL_COMMUNITY): Payer: Self-pay

## 2016-03-19 ENCOUNTER — Inpatient Hospital Stay (HOSPITAL_COMMUNITY): Payer: Medicare HMO

## 2016-03-19 ENCOUNTER — Telehealth (HOSPITAL_COMMUNITY): Payer: Self-pay | Admitting: *Deleted

## 2016-03-19 DIAGNOSIS — Z7189 Other specified counseling: Secondary | ICD-10-CM | POA: Diagnosis not present

## 2016-03-19 DIAGNOSIS — Z7901 Long term (current) use of anticoagulants: Secondary | ICD-10-CM | POA: Diagnosis not present

## 2016-03-19 DIAGNOSIS — Z87891 Personal history of nicotine dependence: Secondary | ICD-10-CM | POA: Diagnosis not present

## 2016-03-19 DIAGNOSIS — J449 Chronic obstructive pulmonary disease, unspecified: Secondary | ICD-10-CM | POA: Diagnosis present

## 2016-03-19 DIAGNOSIS — Z6835 Body mass index (BMI) 35.0-35.9, adult: Secondary | ICD-10-CM | POA: Diagnosis not present

## 2016-03-19 DIAGNOSIS — I5022 Chronic systolic (congestive) heart failure: Secondary | ICD-10-CM | POA: Diagnosis present

## 2016-03-19 DIAGNOSIS — I5023 Acute on chronic systolic (congestive) heart failure: Secondary | ICD-10-CM | POA: Diagnosis present

## 2016-03-19 DIAGNOSIS — F329 Major depressive disorder, single episode, unspecified: Secondary | ICD-10-CM | POA: Diagnosis present

## 2016-03-19 DIAGNOSIS — I2510 Atherosclerotic heart disease of native coronary artery without angina pectoris: Secondary | ICD-10-CM | POA: Diagnosis present

## 2016-03-19 DIAGNOSIS — Z9049 Acquired absence of other specified parts of digestive tract: Secondary | ICD-10-CM | POA: Diagnosis not present

## 2016-03-19 DIAGNOSIS — E860 Dehydration: Secondary | ICD-10-CM | POA: Diagnosis present

## 2016-03-19 DIAGNOSIS — M549 Dorsalgia, unspecified: Secondary | ICD-10-CM | POA: Diagnosis present

## 2016-03-19 DIAGNOSIS — Z794 Long term (current) use of insulin: Secondary | ICD-10-CM | POA: Diagnosis not present

## 2016-03-19 DIAGNOSIS — R0789 Other chest pain: Secondary | ICD-10-CM

## 2016-03-19 DIAGNOSIS — D649 Anemia, unspecified: Secondary | ICD-10-CM | POA: Diagnosis present

## 2016-03-19 DIAGNOSIS — R112 Nausea with vomiting, unspecified: Secondary | ICD-10-CM | POA: Diagnosis not present

## 2016-03-19 DIAGNOSIS — Z515 Encounter for palliative care: Secondary | ICD-10-CM | POA: Diagnosis not present

## 2016-03-19 DIAGNOSIS — E1169 Type 2 diabetes mellitus with other specified complication: Secondary | ICD-10-CM

## 2016-03-19 DIAGNOSIS — R1031 Right lower quadrant pain: Secondary | ICD-10-CM | POA: Diagnosis not present

## 2016-03-19 DIAGNOSIS — R570 Cardiogenic shock: Secondary | ICD-10-CM | POA: Diagnosis not present

## 2016-03-19 DIAGNOSIS — E785 Hyperlipidemia, unspecified: Secondary | ICD-10-CM | POA: Diagnosis present

## 2016-03-19 DIAGNOSIS — K3184 Gastroparesis: Secondary | ICD-10-CM | POA: Diagnosis present

## 2016-03-19 DIAGNOSIS — I9589 Other hypotension: Secondary | ICD-10-CM | POA: Diagnosis not present

## 2016-03-19 DIAGNOSIS — E1143 Type 2 diabetes mellitus with diabetic autonomic (poly)neuropathy: Secondary | ICD-10-CM | POA: Diagnosis present

## 2016-03-19 DIAGNOSIS — E119 Type 2 diabetes mellitus without complications: Secondary | ICD-10-CM

## 2016-03-19 DIAGNOSIS — K760 Fatty (change of) liver, not elsewhere classified: Secondary | ICD-10-CM | POA: Diagnosis present

## 2016-03-19 DIAGNOSIS — Z95810 Presence of automatic (implantable) cardiac defibrillator: Secondary | ICD-10-CM

## 2016-03-19 DIAGNOSIS — Z955 Presence of coronary angioplasty implant and graft: Secondary | ICD-10-CM

## 2016-03-19 DIAGNOSIS — R188 Other ascites: Secondary | ICD-10-CM | POA: Diagnosis present

## 2016-03-19 DIAGNOSIS — I5021 Acute systolic (congestive) heart failure: Secondary | ICD-10-CM | POA: Insufficient documentation

## 2016-03-19 DIAGNOSIS — E871 Hypo-osmolality and hyponatremia: Secondary | ICD-10-CM | POA: Diagnosis present

## 2016-03-19 DIAGNOSIS — Z79899 Other long term (current) drug therapy: Secondary | ICD-10-CM | POA: Diagnosis not present

## 2016-03-19 DIAGNOSIS — Z7982 Long term (current) use of aspirin: Secondary | ICD-10-CM

## 2016-03-19 DIAGNOSIS — Z951 Presence of aortocoronary bypass graft: Secondary | ICD-10-CM

## 2016-03-19 DIAGNOSIS — I959 Hypotension, unspecified: Secondary | ICD-10-CM | POA: Diagnosis present

## 2016-03-19 DIAGNOSIS — I110 Hypertensive heart disease with heart failure: Secondary | ICD-10-CM | POA: Diagnosis present

## 2016-03-19 DIAGNOSIS — R079 Chest pain, unspecified: Secondary | ICD-10-CM | POA: Diagnosis not present

## 2016-03-19 DIAGNOSIS — IMO0001 Reserved for inherently not codable concepts without codable children: Secondary | ICD-10-CM

## 2016-03-19 DIAGNOSIS — E1159 Type 2 diabetes mellitus with other circulatory complications: Secondary | ICD-10-CM | POA: Diagnosis present

## 2016-03-19 DIAGNOSIS — R1110 Vomiting, unspecified: Secondary | ICD-10-CM | POA: Insufficient documentation

## 2016-03-19 LAB — URINALYSIS, ROUTINE W REFLEX MICROSCOPIC
Bilirubin Urine: NEGATIVE
Glucose, UA: NEGATIVE mg/dL
Hgb urine dipstick: NEGATIVE
Ketones, ur: NEGATIVE mg/dL
Leukocytes, UA: NEGATIVE
NITRITE: NEGATIVE
PH: 6 (ref 5.0–8.0)
Protein, ur: NEGATIVE mg/dL
SPECIFIC GRAVITY, URINE: 1.013 (ref 1.005–1.030)

## 2016-03-19 LAB — BASIC METABOLIC PANEL
ANION GAP: 8 (ref 5–15)
Anion gap: 10 (ref 5–15)
BUN: 36 mg/dL — ABNORMAL HIGH (ref 6–20)
BUN: 35 mg/dL — ABNORMAL HIGH (ref 6–20)
CALCIUM: 8.7 mg/dL — AB (ref 8.9–10.3)
CHLORIDE: 87 mmol/L — AB (ref 101–111)
CHLORIDE: 89 mmol/L — AB (ref 101–111)
CO2: 28 mmol/L (ref 22–32)
CO2: 31 mmol/L (ref 22–32)
CREATININE: 1.19 mg/dL (ref 0.61–1.24)
Calcium: 8.8 mg/dL — ABNORMAL LOW (ref 8.9–10.3)
Creatinine, Ser: 1.19 mg/dL (ref 0.61–1.24)
GFR calc Af Amer: >60 mL/min (ref >60)
GFR calc non Af Amer: >60 mL/min (ref >60)
GFR calc non Af Amer: >60 mL/min (ref >60)
GLUCOSE: 248 mg/dL — AB (ref 65–99)
Glucose, Bld: 147 mg/dL — ABNORMAL HIGH (ref 65–99)
POTASSIUM: 3.9 mmol/L (ref 3.5–5.1)
Potassium: 3.8 mmol/L (ref 3.5–5.1)
SODIUM: 128 mmol/L — AB (ref 135–145)
Sodium: 125 mmol/L — ABNORMAL LOW (ref 135–145)

## 2016-03-19 LAB — HEPATIC FUNCTION PANEL
ALBUMIN: 3.7 g/dL (ref 3.5–5.0)
ALK PHOS: 106 U/L (ref 38–126)
ALT: 28 U/L (ref 17–63)
AST: 34 U/L (ref 15–41)
Bilirubin, Direct: 0.4 mg/dL (ref 0.1–0.5)
Indirect Bilirubin: 0.6 mg/dL (ref 0.3–0.9)
Total Bilirubin: 1 mg/dL (ref 0.3–1.2)
Total Protein: 7 g/dL (ref 6.5–8.1)

## 2016-03-19 LAB — BRAIN NATRIURETIC PEPTIDE: B Natriuretic Peptide: 1079.6 pg/mL — ABNORMAL HIGH (ref 0.0–100.0)

## 2016-03-19 LAB — CBC
HCT: 34.5 % — ABNORMAL LOW (ref 39.0–52.0)
HEMOGLOBIN: 11.1 g/dL — AB (ref 13.0–17.0)
MCH: 28.8 pg (ref 26.0–34.0)
MCHC: 32.2 g/dL (ref 30.0–36.0)
MCV: 89.6 fL (ref 78.0–100.0)
PLATELETS: 162 10*3/uL (ref 150–400)
RBC: 3.85 MIL/uL — ABNORMAL LOW (ref 4.22–5.81)
RDW: 16.1 % — ABNORMAL HIGH (ref 11.5–15.5)
WBC: 7.4 10*3/uL (ref 4.0–10.5)

## 2016-03-19 LAB — LIPASE, BLOOD: Lipase: 25 U/L (ref 11–51)

## 2016-03-19 LAB — GLUCOSE, CAPILLARY: Glucose-Capillary: 204 mg/dL — ABNORMAL HIGH (ref 65–99)

## 2016-03-19 LAB — TROPONIN I: TROPONIN I: 0.06 ng/mL — AB (ref <0.031)

## 2016-03-19 LAB — I-STAT TROPONIN, ED: TROPONIN I, POC: 0.03 ng/mL (ref 0.00–0.08)

## 2016-03-19 MED ORDER — LOSARTAN POTASSIUM 25 MG PO TABS
25.0000 mg | ORAL_TABLET | Freq: Two times a day (BID) | ORAL | Status: DC
  Administered 2016-03-20 – 2016-03-23 (×6): 25 mg via ORAL
  Filled 2016-03-19 (×7): qty 1

## 2016-03-19 MED ORDER — ONDANSETRON HCL 40 MG/20ML IJ SOLN
8.0000 mg | Freq: Three times a day (TID) | INTRAMUSCULAR | Status: DC
  Administered 2016-03-19 – 2016-03-20 (×2): 8 mg via INTRAVENOUS
  Filled 2016-03-19 (×5): qty 4

## 2016-03-19 MED ORDER — ATORVASTATIN CALCIUM 20 MG PO TABS
20.0000 mg | ORAL_TABLET | Freq: Every day | ORAL | Status: DC
  Administered 2016-03-20 – 2016-03-24 (×5): 20 mg via ORAL
  Filled 2016-03-19 (×5): qty 1

## 2016-03-19 MED ORDER — FAMOTIDINE IN NACL 20-0.9 MG/50ML-% IV SOLN
20.0000 mg | Freq: Once | INTRAVENOUS | Status: AC
  Administered 2016-03-19: 20 mg via INTRAVENOUS
  Filled 2016-03-19: qty 50

## 2016-03-19 MED ORDER — ACETAMINOPHEN 500 MG PO TABS
1000.0000 mg | ORAL_TABLET | Freq: Four times a day (QID) | ORAL | Status: DC | PRN
  Administered 2016-03-19 – 2016-03-22 (×5): 1000 mg via ORAL
  Filled 2016-03-19 (×5): qty 2

## 2016-03-19 MED ORDER — ESCITALOPRAM OXALATE 10 MG PO TABS
20.0000 mg | ORAL_TABLET | Freq: Every day | ORAL | Status: DC
  Administered 2016-03-20 – 2016-03-25 (×6): 20 mg via ORAL
  Filled 2016-03-19 (×6): qty 2

## 2016-03-19 MED ORDER — ACETAMINOPHEN 650 MG RE SUPP: 650.0000 mg | Freq: Four times a day (QID) | RECTAL | Status: DC | PRN

## 2016-03-19 MED ORDER — INSULIN ASPART 100 UNIT/ML ~~LOC~~ SOLN
0.0000 [IU] | Freq: Three times a day (TID) | SUBCUTANEOUS | Status: DC
  Administered 2016-03-20 – 2016-03-21 (×4): 2 [IU] via SUBCUTANEOUS
  Administered 2016-03-22: 1 [IU] via SUBCUTANEOUS

## 2016-03-19 MED ORDER — PROMETHAZINE HCL 25 MG/ML IJ SOLN
12.5000 mg | Freq: Four times a day (QID) | INTRAMUSCULAR | Status: DC | PRN
  Administered 2016-03-20: 12.5 mg via INTRAVENOUS
  Filled 2016-03-19: qty 1

## 2016-03-19 MED ORDER — BACLOFEN 10 MG PO TABS
10.0000 mg | ORAL_TABLET | Freq: Three times a day (TID) | ORAL | Status: DC | PRN
  Administered 2016-03-21 – 2016-03-22 (×4): 10 mg via ORAL
  Filled 2016-03-19 (×7): qty 1

## 2016-03-19 MED ORDER — ASPIRIN EC 81 MG PO TBEC
81.0000 mg | DELAYED_RELEASE_TABLET | Freq: Every day | ORAL | Status: DC
  Administered 2016-03-20 – 2016-03-22 (×3): 81 mg via ORAL
  Filled 2016-03-19 (×3): qty 1

## 2016-03-19 MED ORDER — INSULIN GLARGINE 100 UNIT/ML ~~LOC~~ SOLN
20.0000 [IU] | Freq: Every day | SUBCUTANEOUS | Status: DC
  Administered 2016-03-19 – 2016-03-25 (×6): 20 [IU] via SUBCUTANEOUS
  Filled 2016-03-19 (×9): qty 0.2

## 2016-03-19 MED ORDER — ISOSORBIDE MONONITRATE ER 30 MG PO TB24
30.0000 mg | ORAL_TABLET | Freq: Every day | ORAL | Status: DC
  Administered 2016-03-20 – 2016-03-23 (×4): 30 mg via ORAL
  Filled 2016-03-19 (×4): qty 1

## 2016-03-19 MED ORDER — ACETAMINOPHEN 325 MG PO TABS
650.0000 mg | ORAL_TABLET | Freq: Four times a day (QID) | ORAL | Status: DC | PRN
  Administered 2016-03-21: 650 mg via ORAL
  Filled 2016-03-19: qty 2

## 2016-03-19 MED ORDER — GI COCKTAIL ~~LOC~~
30.0000 mL | Freq: Once | ORAL | Status: AC
  Administered 2016-03-19: 30 mL via ORAL
  Filled 2016-03-19: qty 30

## 2016-03-19 MED ORDER — INSULIN ASPART 100 UNIT/ML ~~LOC~~ SOLN
0.0000 [IU] | Freq: Every day | SUBCUTANEOUS | Status: DC
  Administered 2016-03-19 – 2016-03-21 (×2): 2 [IU] via SUBCUTANEOUS
  Administered 2016-03-22 – 2016-03-24 (×2): 3 [IU] via SUBCUTANEOUS

## 2016-03-19 MED ORDER — POTASSIUM CHLORIDE CRYS ER 20 MEQ PO TBCR
20.0000 meq | EXTENDED_RELEASE_TABLET | Freq: Two times a day (BID) | ORAL | Status: DC
  Administered 2016-03-19 – 2016-03-25 (×12): 20 meq via ORAL
  Filled 2016-03-19 (×12): qty 1

## 2016-03-19 MED ORDER — ONDANSETRON HCL 4 MG/2ML IJ SOLN
4.0000 mg | Freq: Once | INTRAMUSCULAR | Status: AC
  Administered 2016-03-19: 4 mg via INTRAVENOUS
  Filled 2016-03-19: qty 2

## 2016-03-19 MED ORDER — ENOXAPARIN SODIUM 40 MG/0.4ML ~~LOC~~ SOLN
40.0000 mg | SUBCUTANEOUS | Status: DC
  Administered 2016-03-19 – 2016-03-21 (×3): 40 mg via SUBCUTANEOUS
  Filled 2016-03-19 (×3): qty 0.4

## 2016-03-19 MED ORDER — SODIUM CHLORIDE 0.9 % IV BOLUS (SEPSIS)
500.0000 mL | Freq: Once | INTRAVENOUS | Status: AC
  Administered 2016-03-19: 500 mL via INTRAVENOUS

## 2016-03-19 MED ORDER — IOPAMIDOL (ISOVUE-300) INJECTION 61%
INTRAVENOUS | Status: AC
  Administered 2016-03-19: 100 mL via INTRAVENOUS
  Filled 2016-03-19: qty 100

## 2016-03-19 MED ORDER — DIGOXIN 250 MCG PO TABS
0.2500 mg | ORAL_TABLET | Freq: Every day | ORAL | Status: DC
Start: 2016-03-20 — End: 2016-03-25
  Administered 2016-03-20 – 2016-03-25 (×6): 0.25 mg via ORAL
  Filled 2016-03-19: qty 2
  Filled 2016-03-19: qty 1
  Filled 2016-03-19: qty 2
  Filled 2016-03-19 (×3): qty 1

## 2016-03-19 MED ORDER — ALBUTEROL SULFATE HFA 108 (90 BASE) MCG/ACT IN AERS: 2.0000 | INHALATION_SPRAY | Freq: Four times a day (QID) | RESPIRATORY_TRACT | Status: DC | PRN

## 2016-03-19 MED ORDER — SPIRONOLACTONE 25 MG PO TABS
25.0000 mg | ORAL_TABLET | Freq: Every day | ORAL | Status: DC
  Administered 2016-03-20 – 2016-03-25 (×6): 25 mg via ORAL
  Filled 2016-03-19 (×6): qty 1

## 2016-03-19 MED ORDER — DIATRIZOATE MEGLUMINE & SODIUM 66-10 % PO SOLN
ORAL | Status: AC
  Administered 2016-03-19: 500 mL
  Filled 2016-03-19: qty 30

## 2016-03-19 MED ORDER — ZOLPIDEM TARTRATE 5 MG PO TABS
5.0000 mg | ORAL_TABLET | Freq: Every evening | ORAL | Status: DC | PRN
  Administered 2016-03-22: 5 mg via ORAL
  Filled 2016-03-19: qty 1

## 2016-03-19 MED ORDER — ALBUTEROL SULFATE (2.5 MG/3ML) 0.083% IN NEBU: 2.5000 mg | INHALATION_SOLUTION | Freq: Four times a day (QID) | RESPIRATORY_TRACT | Status: DC | PRN

## 2016-03-19 MED ORDER — SODIUM CHLORIDE 0.9% FLUSH
3.0000 mL | Freq: Two times a day (BID) | INTRAVENOUS | Status: DC
  Administered 2016-03-19 – 2016-03-24 (×8): 3 mL via INTRAVENOUS

## 2016-03-19 NOTE — Telephone Encounter (Signed)
Pt left a voice message c/o SOB and stomach issues. I attempted to reach pt no answer so i left a voice message on [phone] for pt to call back

## 2016-03-19 NOTE — Telephone Encounter (Signed)
Pt called back and told me he is having increased SOB, dizziness, headache, upset stomach, and mild chest pain.  Spoke with Jonni Sanger who advised patient to go to the ED. Patient agreed and in route to [facility] ED.

## 2016-03-19 NOTE — H&P (Signed)
History and Physical    Ronald Miller. [id number] DOB: [birth year] DOA: 03/19/2016  Referring MD/NP/PA: Janetta Hora PCP: [facility]  Outpatient Specialists: [place]  Patient coming from: home  Chief Complaint: chest pain, nausea, vomiting  HPI: Ronald Miller. is a 60 y.o. male with history of CAD s/p CABG in 2000, all grafts down except for LIMA to LAD, chronic systolic heart failure with EF of 15-20% with recurrent hospitalizations for heart failure exacerbation followed by Dr. Haroldine Laws, s/p Medtronic ICD 2014, essential hypertension, Hx of cocaine and tobacco abuse, Depression, and COPD.  He has been undergoing titration of his diuretics to goal weight of around 228-lbs for now, although he has been as low as 219-lbs.  He has been receiving intermittent paracenteses, but on last [place] a week ago, he did not have enough fluid for paracentesis.  He was seen by the heart failure team on a weekly basis and was seen on Tuesday at which time his weight was 231-lbs.  He had imdur added to his regimen, but otherwise, no changes were made.  The next evening, he developed nausea, particularly after meals, with occasional nonbloody, nonbilious emesis.  He has not had fevers or diarrhea and he denies sick contacts.  He has not been able to eat or drink as much the last day and a half.  He had some neck pains last night in the right neck associated with a racing heart sensation.  He also had a bolt of pain in the left arm that has since resolved.  He also had a "flash" of sharp severe pain across the right chest and epigastrium while in the ER that resolved in a few seconds.  Finally, he has had a nagging pain in the right lower quadrant which is different than the pain that he normally gets from ascites.  He denies abdominal tightness or bloating and feels that overall his swelling is down.  He denies changes to his stools.  He is status post cholecystectomy but he still has his  appendix.  Denies dysuria, however, he has been voiding less over the last 48 hours despite taking his diuretics.    ED Course: In the ER, he was mildly hypotensives to 86/65 and initially tachycardic to 120.  He was given a 525mL bolus and his HR trended down to the 70s and his blood pressure rose to the 90s and low [id number] systolic.  His labs were notable for sodium 125, creatinine at baseline of 1.19.  Troponin was negative.  ECG demonstrated sinus rhythm with occasional PVC, RBBB, Q-waves in inferior leads.  T-waves and ST segments appear similar to prior.  CXR appears to have some fluid in the fissue but does not appear grossly overloaded compared to previous CXR.  BNP is down to 1079.  He was given zofran and his nausea improved in the ER.    Review of Systems:  General:  Denies fevers, chills, weight at home was 228-lbs HEENT:  Denies changes to hearing and vision, rhinorrhea, sinus congestion, sore throat CV:  Denies chest pain and palpitations, lower extremity edema.  PULM:  Denies SOB, wheezing, cough.   GI:  Per HPI   GU:  Denies dysuria, frequency, urgency ENDO:  Denies polyuria, polydipsia.   HEME:  Denies hematemesis, blood in stools, melena, abnormal bruising or bleeding.  LYMPH:  Denies lymphadenopathy.   MSK:  Denies arthralgias, myalgias.   DERM:  Denies skin rash or ulcer.   NEURO:  Denies focal numbness, weakness, slurred speech, confusion, facial droop.  PSYCH:  Denies anxiety and depression.    Past Medical History  Diagnosis Date  . ASCVD (arteriosclerotic cardiovascular disease)   . Diabetes mellitus   . Hypertension   . History of cocaine abuse   . Tobacco abuse   . Depression   . Suicidal ideation   . Chronic systolic CHF (congestive heart failure) ([place])   . COPD (chronic obstructive pulmonary disease) ([place])   . Coronary artery disease   . AICD (automatic cardioverter/defibrillator) present   . Shortness of breath dyspnea     Past Surgical History  Procedure  Laterality Date  . Coronary artery bypass graft  2001  . Lumbar disc surgery  02/1999    Discectomy and fusion  . Vasectomy      Subsequent reversal  . Insert / replace / remove pacemaker    . Cardiac defibrillator placement    . Cholecystectomy N/A 08/30/2015    Procedure: LAPAROSCOPIC CHOLECYSTECTOMY;  Surgeon: Hubbard Robinson, MD;  Location: [name];  Service: General;  Laterality: N/A;  . Cardiac defibrillator placement    . Cardiac catheterization N/A 02/12/2016    Procedure: Right/Left Heart Cath and Coronary/Graft Angiography;  Surgeon: Jolaine Artist, MD;  Location: [facility];  Service: Cardiovascular;  Laterality: N/A;     reports that he quit smoking about 6 months ago. His smoking use included Cigarettes. He has a 34.5 pack-year smoking history. He has never used smokeless tobacco. He reports that he does not drink alcohol or use illicit drugs.  Allergies  Allergen Reactions  . Codeine Nausea And Vomiting    Family History  Problem Relation Age of Onset  . CAD Father   . Diabetes Father   . Heart failure Father     Prior to Admission medications   Medication Sig Start Date End Date Taking? Authorizing Provider  acetaminophen (TYLENOL) 500 MG tablet Take 1,000 mg by mouth every 6 (six) hours as needed for mild pain or headache.   Yes Historical Provider, MD  albuterol (PROVENTIL HFA;VENTOLIN HFA) 108 (90 BASE) MCG/ACT inhaler Inhale 2 puffs into the lungs every 6 (six) hours as needed for wheezing or shortness of breath.   Yes Historical Provider, MD  aspirin EC 81 MG tablet Take 81 mg by mouth daily.   Yes Historical Provider, MD  atorvastatin (LIPITOR) 20 MG tablet Take 1 tablet (20 mg total) by mouth every evening. 01/18/16  Yes Lytle Butte, MD  baclofen (LIORESAL) 10 MG tablet Take 10 mg by mouth 3 (three) times daily as needed for muscle spasms.   Yes Historical Provider, MD  digoxin (LANOXIN) 0.25 MG tablet Take 1 tablet (0.25 mg total) by mouth  daily. 02/19/16  Yes Shaune Pascal Bensimhon, MD  escitalopram (LEXAPRO) 20 MG tablet Take 1 tablet (20 mg total) by mouth daily. 09/02/15  Yes Vipul Manuella Ghazi, MD  insulin aspart (NOVOLOG) 100 UNIT/ML injection Inject 0-15 Units into the skin 3 (three) times daily with meals. SLIDING SCALE   Yes Historical Provider, MD  insulin glargine (LANTUS) 100 UNIT/ML injection Inject 40 Units into the skin at bedtime.    Yes Historical Provider, MD  isosorbide mononitrate (IMDUR) 30 MG 24 hr tablet Take 1 tablet (30 mg total) by mouth daily. 03/17/16  Yes [name] Friar, PA-C  losartan (COZAAR) 25 MG tablet Take 1 tablet (25 mg total) by mouth 2 (two) times daily. 02/19/16  Yes Jolaine Artist, MD  metolazone (ZAROXOLYN) 5 MG tablet Take 1 tablet (5 mg total) by mouth daily as needed (weight greater than 229). Reported on 02/19/2016 03/09/16  Yes Amy D Clegg, NP  potassium chloride SA (K-DUR,KLOR-CON) 20 MEQ tablet Take 1 tablet (20 mEq total) by mouth 2 (two) times daily. 02/19/16  Yes Jolaine Artist, MD  spironolactone (ALDACTONE) 25 MG tablet Take 1 tablet (25 mg total) by mouth daily. 03/10/16  Yes Amy D Clegg, NP  torsemide (DEMADEX) 20 MG tablet Take 2 tablets (40 mg total) by mouth 2 (two) times daily. 03/09/16  Yes Amy D Clegg, NP  zolpidem (AMBIEN) 5 MG tablet Take 1 tablet (5 mg total) by mouth at bedtime as needed for sleep. 01/18/16  Yes Lytle Butte, MD    Physical Exam: Filed Vitals:   03/19/16 1500 03/19/16 1530 03/19/16 1600 03/19/16 1615  BP: 96/62 97/81 91/62  91/63  Pulse: 75 82 84 79  Temp:      TempSrc:      Resp: 19 17 19 21   Height:      Weight:      SpO2: 98% 95% 97% 96%      Constitutional:  Obese male, NAD Eyes: PERRL, lids and conjunctivae normal ENMT: Mucous membranes are moist. Posterior pharynx clear of any exudate or lesions.  Normal dentition.  Neck: normal, supple, no masses, no thyromegaly.  JVP to angle of mandible while upright Respiratory:  Faint wheeze, no focal  rales, rhonchi.   Cardiovascular: Regular rate and rhythm, no murmurs / rubs / gallops.  Minimal right lower extremity edema. 2+ pedal pulses. No carotid bruits.  Abdomen: Normal active BS, soft, nondistended, TTP over the right lower quadrant with some mild jerking/guarding with deep palpation  Musculoskeletal: no clubbing / cyanosis. No joint deformity upper and lower extremities. Good ROM, no contractures. Normal muscle tone.  Skin: no rashes, lesions, ulcers. No induration Neurologic: CN 2-12 grossly intact. Sensation intact, DTR normal. Strength 5/5 in all 4.  Psychiatric: Normal judgment and insight. Alert and oriented x 3. Normal mood.   Labs on Admission: I have personally reviewed following labs and imaging studies  CBC:  Recent Labs Lab 03/19/16 1230  WBC 7.4  HGB 11.1*  HCT 34.5*  MCV 89.6  PLT 0000000   Basic Metabolic Panel:  Recent Labs Lab 03/17/16 1149 03/19/16 1230  NA 133* 125*  K 3.5 3.8  CL 90* 87*  CO2 34* 28  GLUCOSE 219* 248*  BUN 37* 36*  CREATININE 1.20 1.19  CALCIUM 9.5 8.7*   GFR: Estimated Creatinine Clearance: 79.7 mL/min (by C-G formula based on Cr of 1.19). Liver Function Tests:  Recent Labs Lab 03/19/16 1214  AST 34  ALT 28  ALKPHOS 106  BILITOT 1.0  PROT 7.0  ALBUMIN 3.7    Recent Labs Lab 03/19/16 1214  LIPASE 25   No results for input(s): AMMONIA in the last 168 hours. Coagulation Profile: No results for input(s): INR, PROTIME in the last 168 hours. Cardiac Enzymes: No results for input(s): CKTOTAL, CKMB, CKMBINDEX, TROPONINI in the last 168 hours. BNP (last 3 results) No results for input(s): PROBNP in the last 8760 hours. HbA1C: No results for input(s): HGBA1C in the last 72 hours. CBG: No results for input(s): GLUCAP in the last 168 hours. Lipid Profile: No results for input(s): CHOL, HDL, LDLCALC, TRIG, CHOLHDL, LDLDIRECT in the last 72 hours. Thyroid Function Tests: No results for input(s): TSH, T4TOTAL, FREET4,  T3FREE, THYROIDAB in the last 72 hours.  Anemia Panel: No results for input(s): VITAMINB12, FOLATE, FERRITIN, TIBC, IRON, RETICCTPCT in the last 72 hours. Urine analysis:    Component Value Date/Time   COLORURINE YELLOW 03/19/2016 1455   COLORURINE Amber 04/09/2014 1140   APPEARANCEUR CLEAR 03/19/2016 1455   APPEARANCEUR Clear 04/09/2014 1140   LABSPEC 1.013 03/19/2016 1455   LABSPEC 1.026 04/09/2014 1140   PHURINE 6.0 03/19/2016 1455   PHURINE 5.0 04/09/2014 1140   GLUCOSEU NEGATIVE 03/19/2016 1455   GLUCOSEU Negative 04/09/2014 1140   HGBUR NEGATIVE 03/19/2016 1455   HGBUR Negative 04/09/2014 1140   BILIRUBINUR NEGATIVE 03/19/2016 1455   BILIRUBINUR Negative 04/09/2014 1140   KETONESUR NEGATIVE 03/19/2016 1455   KETONESUR Negative 04/09/2014 1140   PROTEINUR NEGATIVE 03/19/2016 1455   PROTEINUR 30 mg/dL 04/09/2014 1140   NITRITE NEGATIVE 03/19/2016 1455   NITRITE Negative 04/09/2014 1140   LEUKOCYTESUR NEGATIVE 03/19/2016 1455   LEUKOCYTESUR Negative 04/09/2014 1140   Sepsis Labs: @LABRCNTIP (procalcitonin:4,lacticidven:4) )No results found for this or any previous visit (from the past 240 hour(s)).   Radiological Exams on Admission: Dg Chest 2 View  03/19/2016  CLINICAL DATA:  Chest pain with cough and shortness of breath for 1 day EXAM: CHEST  2 VIEW COMPARISON:  Feb 23, 2016 FINDINGS: There is no appreciable edema or consolidation. Heart is mildly enlarged, stable. Pulmonary vascularity is normal. Pacemaker leads are attached to the right atrium, right ventricle, and left ventricle. Patient is status post coronary artery bypass grafting. No adenopathy. There is degenerative change in the mid thoracic spine. IMPRESSION: Stable cardiomegaly. Pacemaker leads unchanged. No edema or consolidation. Electronically Signed   By: Lowella Grip III M.D.   On: 03/19/2016 13:42    EKG: NSR with occasional PVC, RBBB, old inferior infarct, no acute ischemic  changes  Assessment/Plan Active Problems:   * No active hospital problems. *   Chest pain, atypical and not similar to his previous anginal pains.  I suspect this is either related to esophagitis from vomiting or muscle strain from vomiting.   -  Telemetry -  Cycle troponins -  GI cocktail  Nausea, vomiting.  He does not appear grossly volume overloaded today.  May be due to viral illness.  Consider gastroparesis given longstanding diabetes.  His right lower quadrant pain is not explained by gastroparesis, however, and based on exam, I doubt he has enough fluid for paracentesis or is high risk for SBP.  Could be secondary to his mild to moderate hyponatremia but I have a lower suspicion for this.  -  CT to rule out appendicitis and check volume of ascites -  Symptomatic care with antiemetics -  If persistent, consider gastric emptying study  Hypotension, possibly due to dehydration or acute illness. No evidence of sepsis or recent MI on bloodwork or ECG.   -  Blood pressure runs low from heart failure -  BP improved with small IV bolus  Chronic Systolic HF with severe biventricular dysfunction EF 15% due to NICM. Has medtronic ICD.  NYHA III. Last dose metolazone 6/12. -  Hold AM dose of torsemide   - Continue losartan 25 bid, place hold parameter for low BP - Continue imdur 30 mg - Continue digoxin  - check dig level   - Continue spironolactone 25 mg daily.  - Instructed to follow low salt diet and limtiing fluids to 2 liters.  -  Daily weights and strict I/O  CAD --severe 3v- CAD s/p CABG with occluded grafts as above except for LIMA. No  targets for revascularization. No CP.  -  Continue aspirin, statin -  No beta blocker due to hypotension  Diabetes mellitus type 2, A1c 8.4 on 02/10/2016 -  Reduce insulin by 50% to 20 units -  SSI  Morbid obesity with fatty liver -- continue with diet and weight loss. Needs to get DM2 under control  Hyponatremia, likely due to mild  dehydration and ADH release from nausea -  Repeat BMP now and again in AM   DVT prophylaxis: lovenox  Code Status: full Family Communication: patient alone  Disposition Plan: to home when tolerating diet  Consults called: none  Admission status: telemetry, inpatient   Janece Canterbury MD [facility] Pager [phone]  If 7PM-7AM, please contact night-coverage [email or web address] Password [facility]  03/19/2016, 4:54 PM

## 2016-03-19 NOTE — ED Notes (Signed)
Paged Dr. Haroldine Laws to [phone]

## 2016-03-19 NOTE — ED Notes (Signed)
Per Pt, Pt was at home yesterday when he became nauseous and noted central lower chest pain/abdominal pain. Pt tried to rest last night, and it didn't get any better. Pt reports two episode of vomiting with some dizziness, lightheadedness, and some nausea. Slight swelling to the right leg.Hx of CABG, Heart Cath, and five stents.

## 2016-03-19 NOTE — ED Provider Notes (Signed)
CSN: [id number]     Arrival date & time 03/19/16  1159 History   First MD Initiated Contact with Patient 03/19/16 1348     Chief Complaint  Patient presents with  . Chest Pain   HPI Comments: 60 year old male presents with acute onset of nausea and vomiting with associated chest pain since yesterday. Past medical history significant for systolic CHF, s/p pacemaker, CAD status post CABG, hypertension, hx of cocaine abuse, tobacco abuse, COPD, fatty liver, s/p cholecystectomy, depression. He was admitted on 4/24, 5/17, and 5/22 for CHF exacerbations and chest pain. Last Echo on 02/10/16 showed EF 15%. Last cath was 02/12/16 which showed severe CAD with occluded grafts. He has required at least 2 paracentesis with ~5L of fluid removed within the last couple months. He reports taking all of his diuretics as prescribed. Reports chronic chest pain, recently started on Imdur. He reports his chest pain is the same as his typical CP pattern however has been more intense recently. He also has issues with bradycardia and hypotension. He is chronically SOB. Has home health nurse however he decided he could not wait for their evaluation today. Lives with sister and brother in law. Cardiologist is Dr. Haroldine Laws. Denies fever, chills, blood in vomit or stool, diarrhea, sick contacts.     Patient is a 60 y.o. male presenting with chest pain.  Chest Pain Associated symptoms: cough, nausea, shortness of breath and vomiting   Associated symptoms: no fever     Past Medical History  Diagnosis Date  . ASCVD (arteriosclerotic cardiovascular disease)   . Diabetes mellitus   . Hypertension   . History of cocaine abuse   . Tobacco abuse   . Depression   . Suicidal ideation   . Chronic systolic CHF (congestive heart failure) ([place])   . COPD (chronic obstructive pulmonary disease) ([place])   . Coronary artery disease   . AICD (automatic cardioverter/defibrillator) present   . Shortness of breath dyspnea    Past  Surgical History  Procedure Laterality Date  . Coronary artery bypass graft  2001  . Lumbar disc surgery  02/1999    Discectomy and fusion  . Vasectomy      Subsequent reversal  . Insert / replace / remove pacemaker    . Cardiac defibrillator placement    . Cholecystectomy N/A 08/30/2015    Procedure: LAPAROSCOPIC CHOLECYSTECTOMY;  Surgeon: Hubbard Robinson, MD;  Location: [name];  Service: General;  Laterality: N/A;  . Cardiac defibrillator placement    . Cardiac catheterization N/A 02/12/2016    Procedure: Right/Left Heart Cath and Coronary/Graft Angiography;  Surgeon: Jolaine Artist, MD;  Location: [facility];  Service: Cardiovascular;  Laterality: N/A;   Family History  Problem Relation Age of Onset  . CAD Father   . Diabetes Father   . Heart failure Father    Social History  Substance Use Topics  . Smoking status: Former Smoker -- 1.50 packs/day for 23 years    Types: Cigarettes    Quit date: 08/27/2015  . Smokeless tobacco: Never Used  . Alcohol Use: No    Review of Systems  Constitutional: Negative for fever and chills.  Respiratory: Positive for cough and shortness of breath.   Cardiovascular: Positive for chest pain.  Gastrointestinal: Positive for nausea, vomiting and abdominal distention. Negative for diarrhea.      Allergies  Codeine  Home Medications   Prior to Admission medications   Medication Sig Start Date End Date Taking? Authorizing Provider  acetaminophen (TYLENOL) 500 MG tablet Take 1,000 mg by mouth every 6 (six) hours as needed for mild pain or headache.   Yes Historical Provider, MD  albuterol (PROVENTIL HFA;VENTOLIN HFA) 108 (90 BASE) MCG/ACT inhaler Inhale 2 puffs into the lungs every 6 (six) hours as needed for wheezing or shortness of breath.   Yes Historical Provider, MD  aspirin EC 81 MG tablet Take 81 mg by mouth daily.   Yes Historical Provider, MD  atorvastatin (LIPITOR) 20 MG tablet Take 1 tablet (20 mg total) by mouth  every evening. 01/18/16  Yes Lytle Butte, MD  baclofen (LIORESAL) 10 MG tablet Take 10 mg by mouth 3 (three) times daily as needed for muscle spasms.   Yes Historical Provider, MD  digoxin (LANOXIN) 0.25 MG tablet Take 1 tablet (0.25 mg total) by mouth daily. 02/19/16  Yes Shaune Pascal Bensimhon, MD  escitalopram (LEXAPRO) 20 MG tablet Take 1 tablet (20 mg total) by mouth daily. 09/02/15  Yes Vipul Manuella Ghazi, MD  insulin aspart (NOVOLOG) 100 UNIT/ML injection Inject 0-15 Units into the skin 3 (three) times daily with meals. SLIDING SCALE   Yes Historical Provider, MD  insulin glargine (LANTUS) 100 UNIT/ML injection Inject 40 Units into the skin at bedtime.    Yes Historical Provider, MD  isosorbide mononitrate (IMDUR) 30 MG 24 hr tablet Take 1 tablet (30 mg total) by mouth daily. 03/17/16  Yes [name] Friar, PA-C  losartan (COZAAR) 25 MG tablet Take 1 tablet (25 mg total) by mouth 2 (two) times daily. 02/19/16  Yes Jolaine Artist, MD  metolazone (ZAROXOLYN) 5 MG tablet Take 1 tablet (5 mg total) by mouth daily as needed (weight greater than 229). Reported on 02/19/2016 03/09/16  Yes Amy D Clegg, NP  potassium chloride SA (K-DUR,KLOR-CON) 20 MEQ tablet Take 1 tablet (20 mEq total) by mouth 2 (two) times daily. 02/19/16  Yes Jolaine Artist, MD  spironolactone (ALDACTONE) 25 MG tablet Take 1 tablet (25 mg total) by mouth daily. 03/10/16  Yes Amy D Clegg, NP  torsemide (DEMADEX) 20 MG tablet Take 2 tablets (40 mg total) by mouth 2 (two) times daily. 03/09/16  Yes Amy D Clegg, NP  zolpidem (AMBIEN) 5 MG tablet Take 1 tablet (5 mg total) by mouth at bedtime as needed for sleep. 01/18/16  Yes Lytle Butte, MD   BP 97/81 mmHg  Pulse 82  Temp(Src) 97.5 F (36.4 C) (Oral)  Resp 17  Ht 5\' 9"  (1.753 m)  Wt 104.781 kg  BMI 34.10 kg/m2  SpO2 95%   Physical Exam  Constitutional: He is oriented to person, place, and time. He appears well-developed and well-nourished. No distress.  Obese  HENT:  Head:  Normocephalic and atraumatic.  Eyes: Conjunctivae are normal. Pupils are equal, round, and reactive to light. Right eye exhibits no discharge. Left eye exhibits no discharge. No scleral icterus.  Neck: Normal range of motion.  Cardiovascular: Normal rate and regular rhythm.  Exam reveals no gallop and no friction rub.   No murmur heard. Pulmonary/Chest: Effort normal and breath sounds normal. No respiratory distress. He has no wheezes. He has no rales. He exhibits no tenderness.  Abdominal: Soft. Bowel sounds are normal. He exhibits distension. He exhibits no mass. There is no hepatosplenomegaly. There is tenderness. There is no rebound, no guarding and no CVA tenderness.  RUQ and epigastric tenderness  Musculoskeletal:  Mild pitting edema bilaterally with venous stasis changes of LLE  Neurological: He is alert and oriented  to person, place, and time.  Skin: Skin is warm and dry.  Psychiatric: He has a normal mood and affect.    ED Course  Procedures (including critical care time) Labs Review Labs Reviewed  BASIC METABOLIC PANEL - Abnormal; Notable for the following:    Sodium 125 (*)    Chloride 87 (*)    Glucose, Bld 248 (*)    BUN 36 (*)    Calcium 8.7 (*)    All other components within normal limits  CBC - Abnormal; Notable for the following:    RBC 3.85 (*)    Hemoglobin 11.1 (*)    HCT 34.5 (*)    RDW 16.1 (*)    All other components within normal limits  BRAIN NATRIURETIC PEPTIDE - Abnormal; Notable for the following:    B Natriuretic Peptide 1079.6 (*)    All other components within normal limits  HEPATIC FUNCTION PANEL  LIPASE, BLOOD  URINALYSIS, ROUTINE W REFLEX MICROSCOPIC (NOT AT [facility])  Randolm Idol, ED    Imaging Review Dg Chest 2 View  03/19/2016  CLINICAL DATA:  Chest pain with cough and shortness of breath for 1 day EXAM: CHEST  2 VIEW COMPARISON:  Feb 23, 2016 FINDINGS: There is no appreciable edema or consolidation. Heart is mildly enlarged, stable.  Pulmonary vascularity is normal. Pacemaker leads are attached to the right atrium, right ventricle, and left ventricle. Patient is status post coronary artery bypass grafting. No adenopathy. There is degenerative change in the mid thoracic spine. IMPRESSION: Stable cardiomegaly. Pacemaker leads unchanged. No edema or consolidation. Electronically Signed   By: Lowella Grip III M.D.   On: 03/19/2016 13:42   I have personally reviewed and evaluated these images and lab results as part of my medical decision-making.   MDM   Final diagnoses:  Chronic systolic heart failure (HCC)  CAD in native artery  Non-intractable vomiting with nausea, vomiting of unspecified type  Chest pain, unspecified chest pain type  Hyponatremia   60 year old male who presents with N/V and chest pain for the past day with increasing abdominal distension. He has been hypotensive here in the ED - lowest reading was 86/65. 554mL bolus given with some improvement in BP. He is afebrile and not hypoxic. Chest pain work up has been negative: EKG shows NSR with no significant change and Troponin is 0.03. BNP is 1079 which is improved from 9 days ago. CBC shows stable anemia but is otherwise unremarkable. BMP is remarkable for hyponatremia: Na is 125, down from 133 2 days ago. BG is also more elevated at 248. LFTs are unremarkable. UA is clean. Lipase is normal. CXR shows stable cardiomegaly with no edema or consolidation. He was given zofran x 2 and his nausea improved in the ER.Consulted Dr. Haroldine Laws who recommends medicine admission since his chest pain work up has been negative. Spoke with Dr. Jearld Adjutant who will admit patient for further mangagement.  Recardo Evangelist, PA-C 03/20/16 1027  Leonard Schwartz, MD 03/26/16 [id number]

## 2016-03-20 DIAGNOSIS — R112 Nausea with vomiting, unspecified: Secondary | ICD-10-CM

## 2016-03-20 DIAGNOSIS — I5022 Chronic systolic (congestive) heart failure: Secondary | ICD-10-CM

## 2016-03-20 LAB — BASIC METABOLIC PANEL
ANION GAP: 9 (ref 5–15)
Anion gap: 8 (ref 5–15)
BUN: 37 mg/dL — ABNORMAL HIGH (ref 6–20)
BUN: 36 mg/dL — AB (ref 6–20)
CALCIUM: 8.3 mg/dL — AB (ref 8.9–10.3)
CHLORIDE: 88 mmol/L — AB (ref 101–111)
CHLORIDE: 88 mmol/L — AB (ref 101–111)
CO2: 28 mmol/L (ref 22–32)
CO2: 28 mmol/L (ref 22–32)
CREATININE: 1.25 mg/dL — AB (ref 0.61–1.24)
CREATININE: 1.24 mg/dL (ref 0.61–1.24)
Calcium: 8.5 mg/dL — ABNORMAL LOW (ref 8.9–10.3)
GFR calc Af Amer: >60 mL/min (ref >60)
GFR calc non Af Amer: >60 mL/min (ref >60)
GFR calc non Af Amer: >60 mL/min (ref >60)
GLUCOSE: 174 mg/dL — AB (ref 65–99)
Glucose, Bld: 107 mg/dL — ABNORMAL HIGH (ref 65–99)
POTASSIUM: 4.3 mmol/L (ref 3.5–5.1)
Potassium: 3.9 mmol/L (ref 3.5–5.1)
SODIUM: 125 mmol/L — AB (ref 135–145)
SODIUM: 124 mmol/L — AB (ref 135–145)

## 2016-03-20 LAB — GLUCOSE, CAPILLARY
GLUCOSE-CAPILLARY: 105 mg/dL — AB (ref 65–99)
GLUCOSE-CAPILLARY: 174 mg/dL — AB (ref 65–99)
GLUCOSE-CAPILLARY: 197 mg/dL — AB (ref 65–99)
Glucose-Capillary: 189 mg/dL — ABNORMAL HIGH (ref 65–99)

## 2016-03-20 LAB — TSH: TSH: 1.481 u[IU]/mL (ref 0.350–4.500)

## 2016-03-20 LAB — TROPONIN I
Troponin I: 0.05 ng/mL — ABNORMAL HIGH (ref <0.031)
Troponin I: 0.05 ng/mL — ABNORMAL HIGH (ref <0.031)

## 2016-03-20 LAB — DIGOXIN LEVEL: DIGOXIN LVL: 0.8 ng/mL (ref 0.8–2.0)

## 2016-03-20 MED ORDER — METOCLOPRAMIDE HCL 10 MG PO TABS
10.0000 mg | ORAL_TABLET | Freq: Three times a day (TID) | ORAL | Status: DC
  Administered 2016-03-20 – 2016-03-25 (×14): 10 mg via ORAL
  Filled 2016-03-20 (×14): qty 1

## 2016-03-20 NOTE — Consult Note (Signed)
Advanced Heart Failure Team Consult Note    Referring: Dr. Sheran Fava Reason for Consult: N/V, HF   HPI:    Ronald Marku. is a 60 y.o. male with history of chronic systolic HF s/p Medtronic ICD 2014, CAD s/p CABG in 2000, HTN, DM2, Hx of cocaine abuse, Tobacco abuse, Depression, and COPD.   Echo 10/14/15 LVEF 20-25%, RV mildly dilated.   Admitted to [facility] from 4/24 - [id number] with A/C systolic HF. Had a US guided Paracentesis with 5 L removed. They decreased his home lasix to 40 mg daily due to low blood pressure. BB held as well with bradycardia and hypotension. Discharge weight 239 lbs  Admitted 5/17 with volume overload and CP. Echo 02/10/16 LVEF 15%, Mild MR, Mod LAE, Severe RAE, severely reduced RV function, PA peak pressure 31 mm Hg. Underwent paracentesis 02/10/16 with 5.2 L out. Ultrasound showed severe fatty liver. Had [facility] yesterday with severe native 3v CAD, All grafts occluded except LIMA to LAD with faint L to R collaterals, elevated filling pressures with normal cardiac output. No targets for revascularization. Weight on d/c 247 -> 219 pounds.   Admitted 5/22 through 5/24 with chest pain and dyspnea. Diuresed with IV lasix and transitioned to torsemide 40 /20 daily. Discharge weight 228 pounds.   Continues to have multiple complaints and now says he wants to discuss heart transplant. Recently added Imdur for CP. Was admitted through ER for N/V and RLQ pain. Says he was unable to hold anything down. In the ER, he was mildly hypotensives to 86/65 and initially tachycardic to 120. He was given a 560mL bolus and his HR trended down to the 70s and his blood pressure rose to the 90s and low [id number] systolic. His labs were notable for sodium 125, creatinine at baseline of 1.19. Troponin was negative. ECG demonstrated sinus rhythm with occasional PVC, RBBB, Q-waves in inferior leads. T-waves and ST segments appear similar to prior. CXR appears to have some fluid in the fissue but does  not appear grossly overloaded compared to previous CXR. BNP is down to 1079. He was given zofran and his nausea improved in the ER.   This am he complains of dry heaves all night but had just finished a full breakfast. Denies orthopnea or PND. Able to do ADLs.    Review of Systems: [y] = yes, [ ]  = no   General: Weight gain [ ] ; Weight loss [ ] ; Anorexia [ ] ; Fatigue [name] ]; Fever [ ] ; Chills [ ] ; Weakness [ ]   Cardiac: Chest pain/pressure [name] ]; Resting SOB [ ] ; Exertional SOB [name] ]; Orthopnea [ ] ; Pedal Edema [ ] ; Palpitations [ ] ; Syncope [ ] ; Presyncope [ ] ; Paroxysmal nocturnal dyspnea[ ]   Pulmonary: Cough [ ] ; Wheezing[ ] ; Hemoptysis[ ] ; Sputum [ ] ; Snoring [ ]   GI: Vomiting[ y]; Dysphagia[ ] ; Melena[ ] ; Hematochezia [ ] ; Heartburn[ ] ; Abdominal pain [ y]; Constipation [ ] ; Diarrhea [ ] ; BRBPR [ ]   GU: Hematuria[ ] ; Dysuria [ ] ; Nocturia[ ]   Vascular: Pain in legs with walking [ ] ; Pain in feet with lying flat [ ] ; Non-healing sores [ ] ; Stroke [ ] ; TIA [ ] ; Slurred speech [ ] ;  Neuro: Headaches[ ] ; Vertigo[ ] ; Seizures[ ] ; Paresthesias[ ] ;Blurred vision [ ] ; Diplopia [ ] ; Vision changes [ ]   Ortho/Skin: Arthritis [ ] ; Joint pain [ ] ; Muscle pain [ ] ; Joint swelling [ ] ; Back Pain [ ] ; Rash [ ]   Psych: Depression[ y]; Anxiety[ ]   Heme: Bleeding problems [ ] ; Clotting disorders [ ] ; Anemia [ ]   Endocrine: Diabetes [name] ]; Thyroid dysfunction[ ]   Home Medications Prior to Admission medications   Medication Sig Start Date End Date Taking? Authorizing Provider  acetaminophen (TYLENOL) 500 MG tablet Take 1,000 mg by mouth every 6 (six) hours as needed for mild pain or headache.   Yes Historical Provider, MD  albuterol (PROVENTIL HFA;VENTOLIN HFA) 108 (90 BASE) MCG/ACT inhaler Inhale 2 puffs into the lungs every 6 (six) hours as needed for wheezing or shortness of breath.   Yes Historical Provider, MD  aspirin EC 81 MG tablet Take 81 mg by mouth daily.   Yes Historical Provider, MD    atorvastatin (LIPITOR) 20 MG tablet Take 1 tablet (20 mg total) by mouth every evening. 01/18/16  Yes Lytle [place]te, MD  baclofen (LIORESAL) 10 MG tablet Take 10 mg by mouth 3 (three) times daily as needed for muscle spasms.   Yes Historical Provider, MD  digoxin (LANOXIN) 0.25 MG tablet Take 1 tablet (0.25 mg total) by mouth daily. 02/19/16  Yes Shaune Pascal Bensimhon, MD  escitalopram (LEXAPRO) 20 MG tablet Take 1 tablet (20 mg total) by mouth daily. 09/02/15  Yes Vipul Manuella Ghazi, MD  insulin aspart (NOVOLOG) 100 UNIT/ML injection Inject 0-15 Units into the skin 3 (three) times daily with meals. SLIDING SCALE   Yes Historical Provider, MD  insulin glargine (LANTUS) 100 UNIT/ML injection Inject 40 Units into the skin at bedtime.    Yes Historical Provider, MD  isosorbide mononitrate (IMDUR) 30 MG 24 hr tablet Take 1 tablet (30 mg total) by mouth daily. 03/17/16  Yes [name] Friar, PA-C  losartan (COZAAR) 25 MG tablet Take 1 tablet (25 mg total) by mouth 2 (two) times daily. 02/19/16  Yes Jolaine Artist, MD  metolazone (ZAROXOLYN) 5 MG tablet Take 1 tablet (5 mg total) by mouth daily as needed (weight greater than 229). Reported on 02/19/2016 03/09/16  Yes Amy D Clegg, NP  potassium chloride SA (K-DUR,KLOR-CON) 20 MEQ tablet Take 1 tablet (20 mEq total) by mouth 2 (two) times daily. 02/19/16  Yes Jolaine Artist, MD  spironolactone (ALDACTONE) 25 MG tablet Take 1 tablet (25 mg total) by mouth daily. 03/10/16  Yes Amy D Clegg, NP  torsemide (DEMADEX) 20 MG tablet Take 2 tablets (40 mg total) by mouth 2 (two) times daily. 03/09/16  Yes Amy D Clegg, NP  zolpidem (AMBIEN) 5 MG tablet Take 1 tablet (5 mg total) by mouth at bedtime as needed for sleep. 01/18/16  Yes Lytle Butte, MD    Past Medical History: Past Medical History  Diagnosis Date  . ASCVD (arteriosclerotic cardiovascular disease)   . Diabetes mellitus   . Hypertension   . History of cocaine abuse   . Tobacco abuse   . Depression   .  Suicidal ideation   . Chronic systolic CHF (congestive heart failure) ([place])   . COPD (chronic obstructive pulmonary disease) ([place])   . Coronary artery disease   . AICD (automatic cardioverter/defibrillator) present   . Shortness of breath dyspnea     Past Surgical History: Past Surgical History  Procedure Laterality Date  . Coronary artery bypass graft  2001  . Lumbar disc surgery  02/1999    Discectomy and fusion  . Vasectomy      Subsequent reversal  . Insert / replace / remove pacemaker    . Cardiac defibrillator placement    . Cholecystectomy N/A 08/30/2015  Procedure: LAPAROSCOPIC CHOLECYSTECTOMY;  Surgeon: Hubbard Robinson, MD;  Location: [name];  Service: General;  Laterality: N/A;  . Cardiac defibrillator placement    . Cardiac catheterization N/A 02/12/2016    Procedure: Right/Left Heart Cath and Coronary/Graft Angiography;  Surgeon: Jolaine Artist, MD;  Location: [place] [facility];  Service: Cardiovascular;  Laterality: N/A;    Family History: Family History  Problem Relation Age of Onset  . CAD Father   . Diabetes Father   . Heart failure Father     Social History: Social History   Social History  . Marital Status: Single    Spouse Name: N/A  . Number of Children: N/A  . Years of Education: N/A   Occupational History  . Retired   . Truck driver    Social History Main Topics  . Smoking status: Former Smoker -- 1.50 packs/day for 23 years    Types: Cigarettes    Quit date: 08/27/2015  . Smokeless tobacco: Never Used  . Alcohol Use: No  . Drug Use: No     Comment: Former  . Sexual Activity: Not Asked   Other Topics Concern  . None   Social History Narrative   Divorced with one child   No regular exercise    Allergies:  Allergies  Allergen Reactions  . Codeine Nausea And Vomiting    Objective:    Vital Signs:   Temp:  [97.5 F (36.4 C)-98.3 F (36.8 C)] 98.3 F (36.8 C) (06/17 0503) Pulse Rate:  [74-120] 82 (06/17  0503) Resp:  [14-21] 18 (06/17 0503) BP: (86-107)/(62-83) 98/83 mmHg (06/17 0503) SpO2:  [90 %-100 %] 97 % (06/17 0503) Weight:  [104.781 kg (231 lb)-107.956 kg (238 lb)] 107.956 kg (238 lb) (06/17 0503) Last BM Date: 03/19/16 Filed Weights   03/19/16 1201 03/19/16 1757 03/20/16 0503  Weight: 104.781 kg (231 lb) 106.505 kg (234 lb 12.8 oz) 107.956 kg (238 lb)    PHYSICAL EXAM: General: Sitting in chair. Just finished breakfast. NAD HEENT: normal Neck: supple. JVD 8 + prominent CV waves. Carotids 2+ bilat; no bruits. No thyromegaly or nodule noted.  Cor: PMI nondisplaced. RRR. No M/G/R Lungs: CTAB, normal effort Abdomen: Obese, NT, mildly distended, no HSM. No bruits or masses. +BS  Extremities: no cyanosis, clubbing. R and LLE Chronic venous stasis changes with healing wound on R shin Trace edmea Neuro: alert & oriented x 3, cranial nerves grossly intact. moves all 4 extremities w/o difficulty. Affect pleasant.  Telemetry: NSR  Labs: Basic Metabolic Panel:  Recent Labs Lab 03/17/16 1149 03/19/16 1230 03/19/16 1856 03/20/16 0646  NA 133* 125* 128* 125*  K 3.5 3.8 3.9 3.9  CL 90* 87* 89* 88*  CO2 34* 28 31 28   GLUCOSE 219* 248* 147* 107*  BUN 37* 36* 35* 37*  CREATININE 1.20 1.19 1.19 1.25*  CALCIUM 9.5 8.7* 8.8* 8.3*    Liver Function Tests:  Recent Labs Lab 03/19/16 1214  AST 34  ALT 28  ALKPHOS 106  BILITOT 1.0  PROT 7.0  ALBUMIN 3.7    Recent Labs Lab 03/19/16 1214  LIPASE 25   No results for input(s): AMMONIA in the last 168 hours.  CBC:  Recent Labs Lab 03/19/16 1230  WBC 7.4  HGB 11.1*  HCT 34.5*  MCV 89.6  PLT 162    Cardiac Enzymes:  Recent Labs Lab 03/19/16 1856 03/20/16 0055 03/20/16 0646  TROPONINI 0.06* 0.05* 0.05*    BNP: BNP (last 3 results)  Recent  Labs  03/09/16 1440 03/10/16 0930 03/19/16 1230  BNP 1634.5* 1369.8* 1079.6*    ProBNP (last 3 results) No results for input(s): PROBNP in the last 8760  hours.   CBG:  Recent Labs Lab 03/19/16 2323 03/20/16 0703  GLUCAP 204* 105*    Coagulation Studies: No results for input(s): LABPROT, INR in the last 72 hours.  Other results: EKG: as per HPI   Imaging: Dg Chest 2 View  03/19/2016  CLINICAL DATA:  Chest pain with cough and shortness of breath for 1 day EXAM: CHEST  2 VIEW COMPARISON:  Feb 23, 2016 FINDINGS: There is no appreciable edema or consolidation. Heart is mildly enlarged, stable. Pulmonary vascularity is normal. Pacemaker leads are attached to the right atrium, right ventricle, and left ventricle. Patient is status post coronary artery bypass grafting. No adenopathy. There is degenerative change in the mid thoracic spine. IMPRESSION: Stable cardiomegaly. Pacemaker leads unchanged. No edema or consolidation. Electronically Signed   By: Lowella Grip III M.D.   On: 03/19/2016 13:42   Ct Abdomen Pelvis W Contrast  03/20/2016  CLINICAL DATA:  Acute right lower quadrant pain.  Vomiting. EXAM: CT ABDOMEN AND PELVIS WITH CONTRAST TECHNIQUE: Multidetector CT imaging of the abdomen and pelvis was performed using the standard protocol following bolus administration of intravenous contrast. CONTRAST:  170mL ISOVUE-300 IOPAMIDOL (ISOVUE-300) INJECTION 61% COMPARISON:  Most recent CT 08/29/2015 FINDINGS: Lower chest: Multi chamber cardiomegaly. There is contrast refluxing into the IVC and hepatic veins. No pleural effusion. Lung bases are clear. Liver: Lobular hepatic contours. Liver appears prominent in size. No discrete focal lesion. Hepatobiliary: Clips in the gallbladder fossa postcholecystectomy. No biliary dilatation. Pancreas: No ductal dilatation or inflammation. Spleen: Upper normal in size measuring 13.1 x 5.4 x 12.6 cm (volume = 463.5 cc). 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 Left adrenal gland is normal. Kidneys: Symmetric renal enhancement. No hydronephrosis. No excretion on delayed phase imaging  bilaterally. Stomach/Bowel: Stomach physiologically distended. There are no dilated or thickened small bowel loops. Small volume of stool throughout the colon without colonic wall thickening. Mild distal colonic diverticulosis without acute diverticulitis. The appendix is normal. Vascular/Lymphatic: No retroperitoneal adenopathy. Prominence celiac axis node measuring 1.6 cm is unchanged. Abdominal aorta is normal in caliber. Mild atherosclerosis. Replaced right hepatic artery. There is a retro aortic left renal vein. Reproductive: Normal sized prostate gland. Bladder: Minimally distended. Mild wall thickening may be secondary to degree of distension. Other: Small to moderate intra-abdominal ascites, most prominent in the right upper quadrant. Mild mesenteric edema. No loculated fluid collection/abscess. Post right inguinal hernia repair. Minimal whole body wall edema. Musculoskeletal: There are no acute or suspicious osseous abnormalities. Degenerative change in the spine. IMPRESSION: 1. Equivocal urinary bladder wall thickening, may be due to degree of distension, however recommend correlation with urinalysis to exclude urinary tract infection. 2. Small to moderate volume intra-abdominal ascites. 3. Normal appendix. 4. Lack of renal excretion on delayed phase imaging, can be seen with underlying renal dysfunction. Recommend continued clinical follow-up. Electronically Signed   By: Jeb Levering M.D.   On: 03/20/2016 01:00         Assessment/Plan:   1. Nausea/vomiting - unclear if this is related to low output HF or more GI in nature.  - I suspect depression may also be playing a role - Would continue po diuretics. Will plan RHC on Monday to further evaluate - Could this be gut ischemia? 2. Chronic Systolic HF with severe biventricular dysfunction EF  15% due to NICM. Has medtronic ICD.  --NYHA III.  --Volume status looks reasonable. Continue home regimen. Plan RHC on Monday 3. CAD --severe 3v-  CAD s/p CABG with occluded grafts as above except for LIMA. No targets for revascularization. Troponin negative  --Continue ASA, statin, Imdur 3. HTN --Stable.  4. DMII --followed by primary team 5. Fatty liver with ascites -- continue with diet and weight loss. Needs to get DM2 under control 6. Ascites- Most recent paracentesis 02/10/2016 with 5.2 liters removed. Recent US last week without sufficient ascites for paracentesis.     Length of Stay: 1 Bensimhon, Daniel 03/20/2016, 8:38 AM  Advanced Heart Failure Team Pager [phone] (M-F; 7a - 4p)  Please contact [facility] for night-coverage after hours (4p -7a ) and weekends on [email or web address]

## 2016-03-20 NOTE — Progress Notes (Signed)
PROGRESS NOTE  Ronald Miller.  [id number] DOB: 1956-08-13 DOA: 03/19/2016 PCP: [facility]  Brief Narrative:   Ronald Linarez. is a 60 y.o. male with history of CAD s/p CABG in 2000, all grafts down except for LIMA to LAD, chronic systolic heart failure with EF of 15-20% with recurrent hospitalizations for heart failure exacerbation followed by Dr. Haroldine Laws, s/p Med[employer] ICD 2014, essential hypertension, Hx of cocaine and tobacco abuse, Depression, and COPD. He has been undergoing titration of his diuretics to goal weight of around 228-lbs for now, although he has been as low as 219-lbs. He has been receiving intermittent paracenteses, but on last [place] a week ago, he did not have enough fluid for paracentesis. He was seen by the heart failure team on a weekly basis and was seen on Tuesday at which time his weight was 231-lbs. He had imdur added to his regimen, but otherwise, no changes were made. The next evening, he developed nausea, particularly after meals, with occasional nonbloody, nonbilious emesis. He has not had fevers or diarrhea and he denies sick contacts. He has not been able to eat or drink as much the last day and a half. He had some neck pains last night in the right neck associated with a racing heart sensation. He also had a bolt of pain in the left arm that has since resolved. He also had a "flash" of sharp severe pain across the right chest and epigastrium while in the ER that resolved in a few seconds. Finally, he has had a nagging pain in the right lower quadrant which is different than the pain that he normally gets from ascites. He denies abdominal tightness or bloating and feels that overall his swelling is down. He denies changes to his stools. He is status post cholecystectomy but he still has his appendix. Denies dysuria, however, he has been voiding less over the last 48 hours despite taking his diuretics.   ED Course: In the ER, he  was mildly hypotensives to 86/65 and initially tachycardic to 120. He was given a 581mL bolus and his HR trended down to the 70s and his blood pressure rose to the 90s and low [id number] systolic. His labs were notable for sodium 125, creatinine at baseline of 1.19. Troponin was negative. ECG demonstrated sinus rhythm with occasional PVC, RBBB, Q-waves in inferior leads. T-waves and ST segments appear similar to prior. CXR appears to have some fluid in the fissue but does not appear grossly overloaded compared to previous CXR. BNP is down to 1079. He was given zofran and his nausea improved in the ER.   Assessment & Plan:   Active Problems:   Hypotension   Diabetes mellitus (HCC)   Chronic systolic heart failure (HCC)   Ascites   Nausea & vomiting   Acute right lower quadrant pain   Nausea and vomiting   Atypical chest pain  Chest pain, atypical and not similar to his previous anginal pains. I suspect this is either related to esophagitis from vomiting or muscle strain from vomiting.  - Telemetry:  Paced rhythm and sinus rhythm - Troponins minimally elevated and flat, inconsistent with ACS - GI cocktail did not help  Nausea, vomiting. May be due to viral illness vs. gastroparesis given longstanding diabetes. LFTs, lipase, and UA unremarkable. - CT negative for appendicitis and only mild to moderate ascites - trial of reglan - If persistent, consider gastric emptying study   Hypotension, possibly due to dehydration  or acute illness. No evidence of sepsis or recent MI on bloodwork or ECG.  - Blood pressure runs low from heart failure  Chronic Systolic HF with severe biventricular dysfunction EF 15% due to NICM. Has medtronic ICD. NYHA III. Last dose metolazone 6/12.  CXR not congested and BNP lower than usual. - resume torsemide   - Continue losartan 25 bid, place hold parameter for low BP - Continue imdur 30 mg - Continue digoxin  - dig level0.8 - Continue  spironolactone 25 mg daily.  - appreciate Heart failure team assistance  CAD --severe 3v- CAD s/p CABG with occluded grafts as above except for LIMA to LAD. No targets for revascularization. No CP.  - Continue aspirin, statin - No beta blocker due to hypotension  Diabetes mellitus type 2, A1c 8.4 on 02/10/2016, CBG moderately controlled - cont lantus 20 units - SSI  Morbid obesity with fatty liver -- continue with diet and weight loss. Needs to get DM2 under control  Hyponatremia, likely due to ADH release from nausea, not improved this morning.  May also be due to worsening heart failure.   - check TSH, cortisol, serum and urine sodium and osms -  Repeat BMP this afternoon -  Continue telemetry  DVT prophylaxis: lovenox  Code Status: full Family Communication: patient alone  Disposition Plan: to home when tolerating diet   Consultants:   Heart failure team  Procedures:  none  Antimicrobials:   none    Subjective:  Heaving all night.  Feels terrible this morning.  Wants to sleep.  Still having some right neck pain and his abdomen is tender.  Denies swelling.    Objective: Filed Vitals:   03/20/16 0900 03/20/16 0934 03/20/16 1100 03/20/16 1118  BP: 107/66  91/62 104/80  Pulse: 81 80 83   Temp: 97.5 F (36.4 C)     TempSrc: Oral     Resp: 18     Height:      Weight:      SpO2: 94%  96%     Intake/Output Summary (Last 24 hours) at 03/20/16 1453 Last data filed at 03/20/16 0935  Gross per 24 hour  Intake   1111 ml  Output   1075 ml  Net     36 ml   Filed Weights   03/19/16 1201 03/19/16 1757 03/20/16 0503  Weight: 104.781 kg (231 lb) 106.505 kg (234 lb 12.8 oz) 107.956 kg (238 lb)    Examination:  General exam:  Adult male, asleep but arouseable.  No acute distress.  HEENT:  NCAT, MMM Respiratory system:  Wheezing, no rales or rhonchi Cardiovascular system: Regular rate and rhythm, normal S1/S2. No murmurs, rubs, gallops or clicks.  Warm  extremities Gastrointestinal system: Normal active bowel sounds, soft, mildly distended, less TTP today. MSK:  Normal tone and bulk, no lower extremity edema Neuro:  Grossly intact    Data Reviewed: I have personally reviewed following labs and imaging studies  CBC:  Recent Labs Lab 03/19/16 1230  WBC 7.4  HGB 11.1*  HCT 34.5*  MCV 89.6  PLT 0000000   Basic Metabolic Panel:  Recent Labs Lab 03/17/16 1149 03/19/16 1230 03/19/16 1856 03/20/16 0646  NA 133* 125* 128* 125*  K 3.5 3.8 3.9 3.9  CL 90* 87* 89* 88*  CO2 34* 28 31 28   GLUCOSE 219* 248* 147* 107*  BUN 37* 36* 35* 37*  CREATININE 1.20 1.19 1.19 1.25*  CALCIUM 9.5 8.7* 8.8* 8.3*   GFR: Estimated Creatinine  Clearance: 77 mL/min (by C-G formula based on Cr of 1.25). Liver Function Tests:  Recent Labs Lab 03/19/16 1214  AST 34  ALT 28  ALKPHOS 106  BILITOT 1.0  PROT 7.0  ALBUMIN 3.7    Recent Labs Lab 03/19/16 1214  LIPASE 25   No results for input(s): AMMONIA in the last 168 hours. Coagulation Profile: No results for input(s): INR, PROTIME in the last 168 hours. Cardiac Enzymes:  Recent Labs Lab 03/19/16 1856 03/20/16 0055 03/20/16 0646  TROPONINI 0.06* 0.05* 0.05*   BNP (last 3 results) No results for input(s): PROBNP in the last 8760 hours. HbA1C: No results for input(s): HGBA1C in the last 72 hours. CBG:  Recent Labs Lab 03/19/16 2323 03/20/16 0703 03/20/16 1113  GLUCAP 204* 105* 174*   Lipid Profile: No results for input(s): CHOL, HDL, LDLCALC, TRIG, CHOLHDL, LDLDIRECT in the last 72 hours. Thyroid Function Tests: No results for input(s): TSH, T4TOTAL, FREET4, T3FREE, THYROIDAB in the last 72 hours. Anemia Panel: No results for input(s): VITAMINB12, FOLATE, FERRITIN, TIBC, IRON, RETICCTPCT in the last 72 hours. Urine analysis:    Component Value Date/Time   COLORURINE YELLOW 03/19/2016 1455   COLORURINE Amber 04/09/2014 1140   APPEARANCEUR CLEAR 03/19/2016 1455    APPEARANCEUR Clear 04/09/2014 1140   LABSPEC 1.013 03/19/2016 1455   LABSPEC 1.026 04/09/2014 1140   PHURINE 6.0 03/19/2016 1455   PHURINE 5.0 04/09/2014 1140   GLUCOSEU NEGATIVE 03/19/2016 1455   GLUCOSEU Negative 04/09/2014 1140   HGBUR NEGATIVE 03/19/2016 1455   HGBUR Negative 04/09/2014 1140   BILIRUBINUR NEGATIVE 03/19/2016 1455   BILIRUBINUR Negative 04/09/2014 1140   KETONESUR NEGATIVE 03/19/2016 1455   KETONESUR Negative 04/09/2014 1140   PROTEINUR NEGATIVE 03/19/2016 1455   PROTEINUR 30 mg/dL 04/09/2014 1140   NITRITE NEGATIVE 03/19/2016 1455   NITRITE Negative 04/09/2014 1140   LEUKOCYTESUR NEGATIVE 03/19/2016 1455   LEUKOCYTESUR Negative 04/09/2014 1140   Sepsis Labs: @LABRCNTIP (procalcitonin:4,lacticidven:4)  )No results found for this or any previous visit (from the past 240 hour(s)).    Radiology Studies: Dg Chest 2 View  03/19/2016  CLINICAL DATA:  Chest pain with cough and shortness of breath for 1 day EXAM: CHEST  2 VIEW COMPARISON:  Feb 23, 2016 FINDINGS: There is no appreciable edema or consolidation. Heart is mildly enlarged, stable. Pulmonary vascularity is normal. Pacemaker leads are attached to the right atrium, right ventricle, and left ventricle. Patient is status post coronary artery bypass grafting. No adenopathy. There is degenerative change in the mid thoracic spine. IMPRESSION: Stable cardiomegaly. Pacemaker leads unchanged. No edema or consolidation. Electronically Signed   By: Lowella Grip III M.D.   On: 03/19/2016 13:42   Ct Abdomen Pelvis W Contrast  03/20/2016  CLINICAL DATA:  Acute right lower quadrant pain.  Vomiting. EXAM: CT ABDOMEN AND PELVIS WITH CONTRAST TECHNIQUE: Multidetector CT imaging of the abdomen and pelvis was performed using the standard protocol following bolus administration of intravenous contrast. CONTRAST:  183mL ISOVUE-300 IOPAMIDOL (ISOVUE-300) INJECTION 61% COMPARISON:  Most recent CT 08/29/2015 FINDINGS: Lower chest:  Multi chamber cardiomegaly. There is contrast refluxing into the IVC and hepatic veins. No pleural effusion. Lung bases are clear. Liver: Lobular hepatic contours. Liver appears prominent in size. No discrete focal lesion. Hepatobiliary: Clips in the gallbladder fossa postcholecystectomy. No biliary dilatation. Pancreas: No ductal dilatation or inflammation. Spleen: Upper normal in size measuring 13.1 x 5.4 x 12.6 cm (volume = 463.5 cc). 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 Left  adrenal gland is normal. Kidneys: Symmetric renal enhancement. No hydronephrosis. No excretion on delayed phase imaging bilaterally. Stomach/Bowel: Stomach physiologically distended. There are no dilated or thickened small bowel loops. Small volume of stool throughout the colon without colonic wall thickening. Mild distal colonic diverticulosis without acute diverticulitis. The appendix is normal. Vascular/Lymphatic: No retroperitoneal adenopathy. Prominence celiac axis node measuring 1.6 cm is unchanged. Abdominal aorta is normal in caliber. Mild atherosclerosis. Replaced right hepatic artery. There is a retro aortic left renal vein. Reproductive: Normal sized prostate gland. Bladder: Minimally distended. Mild wall thickening may be secondary to degree of distension. Other: Small to moderate intra-abdominal ascites, most prominent in the right upper quadrant. Mild mesenteric edema. No loculated fluid collection/abscess. Post right inguinal hernia repair. Minimal whole body wall edema. Musculoskeletal: There are no acute or suspicious osseous abnormalities. Degenerative change in the spine. IMPRESSION: 1. Equivocal urinary bladder wall thickening, may be due to degree of distension, however recommend correlation with urinalysis to exclude urinary tract infection. 2. Small to moderate volume intra-abdominal ascites. 3. Normal appendix. 4. Lack of renal excretion on delayed phase imaging, can be seen with underlying  renal dysfunction. Recommend continued clinical follow-up. Electronically Signed   By: Jeb Levering M.D.   On: 03/20/2016 01:00     Scheduled Meds: . aspirin EC  81 mg Oral Daily  . atorvastatin  20 mg Oral q1800  . digoxin  0.25 mg Oral Daily  . enoxaparin (LOVENOX) injection  40 mg Subcutaneous Q24H  . escitalopram  20 mg Oral Daily  . insulin aspart  0-5 Units Subcutaneous QHS  . insulin aspart  0-9 Units Subcutaneous TID WC  . insulin glargine  20 Units Subcutaneous QHS  . isosorbide mononitrate  30 mg Oral Daily  . losartan  25 mg Oral BID  . ondansetron (ZOFRAN) IV  8 mg Intravenous Q8H  . potassium chloride SA  20 mEq Oral BID  . sodium chloride flush  3 mL Intravenous Q12H  . spironolactone  25 mg Oral Daily   Continuous Infusions:    LOS: 1 day    Time spent: 30 min    Janece Canterbury, MD [facility] Pager [phone]  If 7PM-7AM, please contact night-coverage [email or web address] Password [facility] 03/20/2016, 2:53 PM

## 2016-03-20 NOTE — Progress Notes (Signed)
Patient refused bed alarm. Will continue to monitor.  

## 2016-03-21 DIAGNOSIS — R1110 Vomiting, unspecified: Secondary | ICD-10-CM | POA: Insufficient documentation

## 2016-03-21 DIAGNOSIS — I5023 Acute on chronic systolic (congestive) heart failure: Secondary | ICD-10-CM

## 2016-03-21 DIAGNOSIS — E871 Hypo-osmolality and hyponatremia: Secondary | ICD-10-CM | POA: Insufficient documentation

## 2016-03-21 LAB — BASIC METABOLIC PANEL
Anion gap: 9 (ref 5–15)
BUN: 40 mg/dL — AB (ref 6–20)
CALCIUM: 8.5 mg/dL — AB (ref 8.9–10.3)
CO2: 27 mmol/L (ref 22–32)
Chloride: 90 mmol/L — ABNORMAL LOW (ref 101–111)
Creatinine, Ser: 1.33 mg/dL — ABNORMAL HIGH (ref 0.61–1.24)
GFR calc Af Amer: >60 mL/min (ref >60)
GFR, EST NON AFRICAN AMERICAN: 57 mL/min — AB (ref >60)
GLUCOSE: 139 mg/dL — AB (ref 65–99)
POTASSIUM: 4 mmol/L (ref 3.5–5.1)
Sodium: 126 mmol/L — ABNORMAL LOW (ref 135–145)

## 2016-03-21 LAB — GLUCOSE, CAPILLARY
GLUCOSE-CAPILLARY: 155 mg/dL — AB (ref 65–99)
GLUCOSE-CAPILLARY: 213 mg/dL — AB (ref 65–99)
Glucose-Capillary: 114 mg/dL — ABNORMAL HIGH (ref 65–99)
Glucose-Capillary: 195 mg/dL — ABNORMAL HIGH (ref 65–99)

## 2016-03-21 LAB — OSMOLALITY: OSMOLALITY: 279 mosm/kg (ref 275–295)

## 2016-03-21 LAB — SODIUM, URINE, RANDOM

## 2016-03-21 LAB — CBC
HEMATOCRIT: 35.8 % — AB (ref 39.0–52.0)
Hemoglobin: 11.8 g/dL — ABNORMAL LOW (ref 13.0–17.0)
MCH: 29.2 pg (ref 26.0–34.0)
MCHC: 33 g/dL (ref 30.0–36.0)
MCV: 88.6 fL (ref 78.0–100.0)
Platelets: 158 10*3/uL (ref 150–400)
RBC: 4.04 MIL/uL — ABNORMAL LOW (ref 4.22–5.81)
RDW: 15.8 % — AB (ref 11.5–15.5)
WBC: 6.5 10*3/uL (ref 4.0–10.5)

## 2016-03-21 LAB — OSMOLALITY, URINE: Osmolality, Ur: 324 mOsm/kg (ref 300–900)

## 2016-03-21 MED ORDER — TORSEMIDE 20 MG PO TABS
40.0000 mg | ORAL_TABLET | Freq: Two times a day (BID) | ORAL | Status: DC
  Administered 2016-03-21 – 2016-03-22 (×2): 40 mg via ORAL
  Filled 2016-03-21 (×2): qty 2

## 2016-03-21 MED ORDER — FUROSEMIDE 10 MG/ML IJ SOLN
80.0000 mg | Freq: Once | INTRAMUSCULAR | Status: AC
  Administered 2016-03-21: 80 mg via INTRAVENOUS
  Filled 2016-03-21: qty 8

## 2016-03-21 MED ORDER — SENNA 8.6 MG PO TABS
2.0000 | ORAL_TABLET | Freq: Every day | ORAL | Status: DC
  Administered 2016-03-21 – 2016-03-24 (×3): 17.2 mg via ORAL
  Filled 2016-03-21 (×4): qty 2

## 2016-03-21 MED ORDER — BISACODYL 10 MG RE SUPP
10.0000 mg | Freq: Once | RECTAL | Status: DC
  Filled 2016-03-21 (×2): qty 1

## 2016-03-21 MED ORDER — POLYETHYLENE GLYCOL 3350 17 G PO PACK
17.0000 g | PACK | Freq: Every day | ORAL | Status: DC
  Administered 2016-03-25: 17 g via ORAL
  Filled 2016-03-21 (×4): qty 1

## 2016-03-21 NOTE — Progress Notes (Addendum)
Advanced Heart Failure Rounding Note   Subjective:    Feels bloated. No orthopnea or PND. Weight up 1 pound. CT with mild to moderste ascites. + minimal body wall edema.     Objective:   Weight Range:  Vital Signs:   Temp:  [97.5 F (36.4 C)-98.7 F (37.1 C)] 97.8 F (36.6 C) (06/18 0626) Pulse Rate:  [75-85] 75 (06/18 0626) Resp:  [18-20] 19 (06/18 0626) BP: (91-107)/(62-80) 95/80 mmHg (06/18 0626) SpO2:  [94 %-96 %] 96 % (06/18 0626) Weight:  [108.727 kg (239 lb 11.2 oz)] 108.727 kg (239 lb 11.2 oz) (06/18 0626) Last BM Date: 03/19/16  Weight change: Filed Weights   03/19/16 1757 03/20/16 0503 03/21/16 0626  Weight: 106.505 kg (234 lb 12.8 oz) 107.956 kg (238 lb) 108.727 kg (239 lb 11.2 oz)    Intake/Output:   Intake/Output Summary (Last 24 hours) at 03/21/16 0856 Last data filed at 03/21/16 0853  Gross per 24 hour  Intake    543 ml  Output   1325 ml  Net   -782 ml    PHYSICAL EXAM: General: lying flat in bed NAD HEENT: normal Neck: supple. JVP - hard to see ~9 Carotids 2+ bilat; no bruits. No thyromegaly or nodule noted.  Cor: PMI nondisplaced. RRR. No M/G/R Lungs: CTAB, normal effort Abdomen: Obese, NT, mildly distended, no HSM. No bruits or masses. +BS  Extremities: no cyanosis, clubbing. R and LLE Chronic venous stasis changes with healing wound on R shin Trace-1+ edmea Neuro: alert & oriented x 3, cranial nerves grossly intact. moves all 4 extremities w/o difficulty. Affect pleasant.  Telemetry:   Labs: Basic Metabolic Panel:  Recent Labs Lab 03/19/16 1230 03/19/16 1856 03/20/16 0646 03/20/16 1554 03/21/16 0219  NA 125* 128* 125* 124* 126*  K 3.8 3.9 3.9 4.3 4.0  CL 87* 89* 88* 88* 90*  CO2 28 31 28 28 27   GLUCOSE 248* 147* 107* 174* 139*  BUN 36* 35* 37* 36* 40*  CREATININE 1.19 1.19 1.25* 1.24 1.33*  CALCIUM 8.7* 8.8* 8.3* 8.5* 8.5*    Liver Function Tests:  Recent Labs Lab 03/19/16 1214  AST 34  ALT 28  ALKPHOS 106    BILITOT 1.0  PROT 7.0  ALBUMIN 3.7    Recent Labs Lab 03/19/16 1214  LIPASE 25   No results for input(s): AMMONIA in the last 168 hours.  CBC:  Recent Labs Lab 03/19/16 1230 03/21/16 0219  WBC 7.4 6.5  HGB 11.1* 11.8*  HCT 34.5* 35.8*  MCV 89.6 88.6  PLT 162 158    Cardiac Enzymes:  Recent Labs Lab 03/19/16 1856 03/20/16 0055 03/20/16 0646  TROPONINI 0.06* 0.05* 0.05*    BNP: BNP (last 3 results)  Recent Labs  03/09/16 1440 03/10/16 0930 03/19/16 1230  BNP 1634.5* 1369.8* 1079.6*    ProBNP (last 3 results) No results for input(s): PROBNP in the last 8760 hours.    Other results:  Imaging: Dg Chest 2 View  03/19/2016  CLINICAL DATA:  Chest pain with cough and shortness of breath for 1 day EXAM: CHEST  2 VIEW COMPARISON:  Feb 23, 2016 FINDINGS: There is no appreciable edema or consolidation. Heart is mildly enlarged, stable. Pulmonary vascularity is normal. Pacemaker leads are attached to the right atrium, right ventricle, and left ventricle. Patient is status post coronary artery bypass grafting. No adenopathy. There is degenerative change in the mid thoracic spine. IMPRESSION: Stable cardiomegaly. Pacemaker leads unchanged. No edema or consolidation. Electronically Signed  By: Lowella Grip III M.D.   On: 03/19/2016 13:42   Ct Abdomen Pelvis W Contrast  03/20/2016  CLINICAL DATA:  Acute right lower quadrant pain.  Vomiting. EXAM: CT ABDOMEN AND PELVIS WITH CONTRAST TECHNIQUE: Multidetector CT imaging of the abdomen and pelvis was performed using the standard protocol following bolus administration of intravenous contrast. CONTRAST:  166mL ISOVUE-300 IOPAMIDOL (ISOVUE-300) INJECTION 61% COMPARISON:  Most recent CT 08/29/2015 FINDINGS: Lower chest: Multi chamber cardiomegaly. There is contrast refluxing into the IVC and hepatic veins. No pleural effusion. Lung bases are clear. Liver: Lobular hepatic contours. Liver appears prominent in size. No discrete  focal lesion. Hepatobiliary: Clips in the gallbladder fossa postcholecystectomy. No biliary dilatation. Pancreas: No ductal dilatation or inflammation. Spleen: Upper normal in size measuring 13.1 x 5.4 x 12.6 cm (volume = 463.5 cc). 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 Left adrenal gland is normal. Kidneys: Symmetric renal enhancement. No hydronephrosis. No excretion on delayed phase imaging bilaterally. Stomach/Bowel: Stomach physiologically distended. There are no dilated or thickened small bowel loops. Small volume of stool throughout the colon without colonic wall thickening. Mild distal colonic diverticulosis without acute diverticulitis. The appendix is normal. Vascular/Lymphatic: No retroperitoneal adenopathy. Prominence celiac axis node measuring 1.6 cm is unchanged. Abdominal aorta is normal in caliber. Mild atherosclerosis. Replaced right hepatic artery. There is a retro aortic left renal vein. Reproductive: Normal sized prostate gland. Bladder: Minimally distended. Mild wall thickening may be secondary to degree of distension. Other: Small to moderate intra-abdominal ascites, most prominent in the right upper quadrant. Mild mesenteric edema. No loculated fluid collection/abscess. Post right inguinal hernia repair. Minimal whole body wall edema. Musculoskeletal: There are no acute or suspicious osseous abnormalities. Degenerative change in the spine. IMPRESSION: 1. Equivocal urinary bladder wall thickening, may be due to degree of distension, however recommend correlation with urinalysis to exclude urinary tract infection. 2. Small to moderate volume intra-abdominal ascites. 3. Normal appendix. 4. Lack of renal excretion on delayed phase imaging, can be seen with underlying renal dysfunction. Recommend continued clinical follow-up. Electronically Signed   By: Jeb Levering M.D.   On: 03/20/2016 01:00      Medications:     Scheduled Medications: . aspirin EC  81 mg  Oral Daily  . atorvastatin  20 mg Oral q1800  . digoxin  0.25 mg Oral Daily  . enoxaparin (LOVENOX) injection  40 mg Subcutaneous Q24H  . escitalopram  20 mg Oral Daily  . insulin aspart  0-5 Units Subcutaneous QHS  . insulin aspart  0-9 Units Subcutaneous TID WC  . insulin glargine  20 Units Subcutaneous QHS  . isosorbide mononitrate  30 mg Oral Daily  . losartan  25 mg Oral BID  . metoCLOPramide  10 mg Oral TID AC  . potassium chloride SA  20 mEq Oral BID  . sodium chloride flush  3 mL Intravenous Q12H  . spironolactone  25 mg Oral Daily     Infusions:     PRN Medications:  acetaminophen **OR** acetaminophen, acetaminophen, albuterol, baclofen, promethazine, zolpidem   Assessment:   1. Nausea/vomiting 2. 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 Has medtronic ICD.  3. CAD --severe 3v- CAD s/p CABG with occluded grafts as above except for LIMA. No targets for revascularization. Troponin negative  3. HTN 4. DMII 5. Fatty liver with ascites 6. Ascites --CT shows mild to moderate ascites  Plan/Discussion:    Continues with NYHA IIIB symptoms. Unclear  if he is low output or not. I also suspect depression is playing a major role. Plan [facility] in am to further evaluate need volume status as well as need for IV inotropes. D/w Dr. Sheran Fava. With body wall edema (right heart failure) on CT will give one dose IV lasix and resume torsemide.   Length of Stay: 2 Glori Bickers MD 03/21/2016, 8:56 AM  Advanced Heart Failure Team Pager [phone] (M-F; [place])  Please contact [facility] for night-coverage after hours (4p -7a ) and weekends on [email or web address]

## 2016-03-21 NOTE — Progress Notes (Addendum)
PROGRESS NOTE  Ronald Miller.  [id number] DOB: 1956-07-24 DOA: 03/19/2016 PCP: [place]  Brief Narrative:   Ronald Heileman. is a 60 y.o. male with history of CAD s/p CABG in 2000, all grafts down except for LIMA to LAD, chronic systolic heart failure with EF of 15-20% with recurrent hospitalizations for heart failure exacerbation followed by Dr. Haroldine Laws, s/p Medtronic ICD 2014, essential hypertension, Hx of cocaine and tobacco abuse, Depression, and COPD who presented with nausea, vomiting, with abdominal, back and chest pain.   He had been compliant with his diuretics.  ED Course: In the ER, BP 86/65 and initially tachycardic to 120. He was given a 534mL bolus and his HR trended down to the 70s and his blood pressure rose to the 90s and low [id number] systolic. His labs were notable for sodium 125, creatinine at baseline of 1.19. Troponin was negative. ECG demonstrated sinus rhythm with occasional PVC, RBBB, Q-waves in inferior leads. T-waves and ST segments appear similar to prior. CXR appears to have some fluid in the fissue but did not appear grossly overloaded compared to previous CXR. BNP was down to 1079. Admitted for hyponatremia, nausea, vomiting.  Assessment & Plan:   Active Problems:   Hypotension   Diabetes mellitus (HCC)   Chronic systolic heart failure (HCC)   Ascites   Nausea & vomiting   Acute right lower quadrant pain   Nausea and vomiting   Atypical chest pain  Chest pain, atypical and not similar to his previous anginal pains. I suspect this is either related to esophagitis from vomiting or muscle strain from vomiting.  - Telemetry:  Paced rhythm and sinus rhythm - Troponins minimally elevated and flat, inconsistent with ACS - GI cocktail did not help  Nausea, vomiting. persistence of symptoms suggests this is not a viral illness.  May have gastroparesis given longstanding diabetes. LFTs, lipase, and UA were unremarkable. I  suspect this may be a symptom of either dehydration or low output heart failure causing mild gut ischemia or congestion.  May also be constipated - CT negative for appendicitis and only mild to moderate ascites - trial of reglan -  Bisacodyl suppository -  Start miralax and senna  Chronic Systolic HF with severe biventricular dysfunction EF 15% due to NICM. Has medtronic ICD. NYHA III. Last dose metolazone 6/12.  CXR not congested and BNP lower than usual.  Still has ascites but urine sodium is low which suggests worsening cardiac output. - Continue torsemide   - Continue losartan 25 bid, place hold parameter for low BP - Continue imdur 30 mg - Continue digoxin  - dig level0.8 - Continue spironolactone 25 mg daily.  - appreciate Heart failure team assistance -  Plan for right heart cath on Monday  Hypotension, BP low from heart failure.  No evidence of sepsis or recent MI on bloodwork or ECG.   CAD.  Severe 3v- CAD s/p CABG with occluded grafts as above except for LIMA to LAD. No targets for revascularization.  - Continue aspirin, statin - No beta blocker due to hypotension  Diabetes mellitus type 2, A1c 8.4 on 02/10/2016, CBG moderately controlled - cont lantus 20 units - SSI  Morbid obesity with fatty liver -- continue with diet and weight loss. Needs to get DM2 under control  Hyponatremia, likely due to ADH release from nausea, not improved this morning.  May also be due to worsening heart failure.  Approximately stable over several days.   -  TSH wnl -  Cortisol was canceled > will reorder with AM labs -  serum osms normal -  Urine sodium low suggesting either dehydration or heart failure -  Repeat BMP in AM  DVT prophylaxis: lovenox  Code Status: full Family Communication: patient alone  Disposition Plan:  Right heart cath on monday  Consultants:   Heart failure team  Procedures:  none  Antimicrobials:   none    Subjective:  Having back pains.   Ongoing nausea, but has been able to eat his meals.  Weight has gone up several kg since admission.    Objective: Filed Vitals:   03/20/16 2100 03/21/16 0626 03/21/16 1127 03/21/16 1230  BP: 106/80 95/80 100/67 98/70  Pulse: 85 75  80  Temp: 98.7 F (37.1 C) 97.8 F (36.6 C) 97.4 F (36.3 C) 97.3 F (36.3 C)  TempSrc: Oral Oral Oral Oral  Resp: 20 19  20   Height:      Weight:  108.727 kg (239 lb 11.2 oz)    SpO2: 95% 96% 94% 96%    Intake/Output Summary (Last 24 hours) at 03/21/16 1524 Last data filed at 03/21/16 1300  Gross per 24 hour  Intake    303 ml  Output   1026 ml  Net   -723 ml   Filed Weights   03/19/16 1757 03/20/16 0503 03/21/16 0626  Weight: 106.505 kg (234 lb 12.8 oz) 107.956 kg (238 lb) 108.727 kg (239 lb 11.2 oz)    Examination:  General exam:  Adult male, asleep but arouseable.  No acute distress.  HEENT:  NCAT, MMM Respiratory system:  Wheezing, no rales or rhonchi Cardiovascular system: Regular rate and rhythm, normal S1/S2. No murmurs, rubs, gallops or clicks.  Warm extremities Gastrointestinal system: Normal active bowel sounds, soft, mildly distended, minimally TTP in lower quadrants without rebound or guarding MSK:  Normal tone and bulk, no lower extremity edema Neuro:  Grossly intact    Data Reviewed: I have personally reviewed following labs and imaging studies  CBC:  Recent Labs Lab 03/19/16 1230 03/21/16 0219  WBC 7.4 6.5  HGB 11.1* 11.8*  HCT 34.5* 35.8*  MCV 89.6 88.6  PLT 162 0000000   Basic Metabolic Panel:  Recent Labs Lab 03/19/16 1230 03/19/16 1856 03/20/16 0646 03/20/16 1554 03/21/16 0219  NA 125* 128* 125* 124* 126*  K 3.8 3.9 3.9 4.3 4.0  CL 87* 89* 88* 88* 90*  CO2 28 31 28 28 27   GLUCOSE 248* 147* 107* 174* 139*  BUN 36* 35* 37* 36* 40*  CREATININE 1.19 1.19 1.25* 1.24 1.33*  CALCIUM 8.7* 8.8* 8.3* 8.5* 8.5*   GFR: Estimated Creatinine Clearance: 72.7 mL/min (by C-G formula based on Cr of 1.33). Liver  Function Tests:  Recent Labs Lab 03/19/16 1214  AST 34  ALT 28  ALKPHOS 106  BILITOT 1.0  PROT 7.0  ALBUMIN 3.7    Recent Labs Lab 03/19/16 1214  LIPASE 25   No results for input(s): AMMONIA in the last 168 hours. Coagulation Profile: No results for input(s): INR, PROTIME in the last 168 hours. Cardiac Enzymes:  Recent Labs Lab 03/19/16 1856 03/20/16 0055 03/20/16 0646  TROPONINI 0.06* 0.05* 0.05*   BNP (last 3 results) No results for input(s): PROBNP in the last 8760 hours. HbA1C: No results for input(s): HGBA1C in the last 72 hours. CBG:  Recent Labs Lab 03/20/16 1113 03/20/16 1608 03/20/16 2050 03/21/16 0624 03/21/16 1234  GLUCAP 174* 189* 197* 114* 195*   Lipid  Profile: No results for input(s): CHOL, HDL, LDLCALC, TRIG, CHOLHDL, LDLDIRECT in the last 72 hours. Thyroid Function Tests:  Recent Labs  03/20/16 1644  TSH 1.481   Anemia Panel: No results for input(s): VITAMINB12, FOLATE, FERRITIN, TIBC, IRON, RETICCTPCT in the last 72 hours. Urine analysis:    Component Value Date/Time   COLORURINE YELLOW 03/19/2016 1455   COLORURINE Amber 04/09/2014 1140   APPEARANCEUR CLEAR 03/19/2016 1455   APPEARANCEUR Clear 04/09/2014 1140   LABSPEC 1.013 03/19/2016 1455   LABSPEC 1.026 04/09/2014 1140   PHURINE 6.0 03/19/2016 1455   PHURINE 5.0 04/09/2014 1140   GLUCOSEU NEGATIVE 03/19/2016 1455   GLUCOSEU Negative 04/09/2014 1140   HGBUR NEGATIVE 03/19/2016 1455   HGBUR Negative 04/09/2014 1140   BILIRUBINUR NEGATIVE 03/19/2016 1455   BILIRUBINUR Negative 04/09/2014 1140   KETONESUR NEGATIVE 03/19/2016 1455   KETONESUR Negative 04/09/2014 1140   PROTEINUR NEGATIVE 03/19/2016 1455   PROTEINUR 30 mg/dL 04/09/2014 1140   NITRITE NEGATIVE 03/19/2016 1455   NITRITE Negative 04/09/2014 1140   LEUKOCYTESUR NEGATIVE 03/19/2016 1455   LEUKOCYTESUR Negative 04/09/2014 1140   Sepsis Labs: @LABRCNTIP (procalcitonin:4,lacticidven:4)  )No results found for  this or any previous visit (from the past 240 hour(s)).    Radiology Studies: Ct Abdomen Pelvis W Contrast  03/20/2016  CLINICAL DATA:  Acute right lower quadrant pain.  Vomiting. EXAM: CT ABDOMEN AND PELVIS WITH CONTRAST TECHNIQUE: Multidetector CT imaging of the abdomen and pelvis was performed using the standard protocol following bolus administration of intravenous contrast. CONTRAST:  165mL ISOVUE-300 IOPAMIDOL (ISOVUE-300) INJECTION 61% COMPARISON:  Most recent CT 08/29/2015 FINDINGS: Lower chest: Multi chamber cardiomegaly. There is contrast refluxing into the IVC and hepatic veins. No pleural effusion. Lung bases are clear. Liver: Lobular hepatic contours. Liver appears prominent in size. No discrete focal lesion. Hepatobiliary: Clips in the gallbladder fossa postcholecystectomy. No biliary dilatation. Pancreas: No ductal dilatation or inflammation. Spleen: Upper normal in size measuring 13.1 x 5.4 x 12.6 cm (volume = 463.5 cc). 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 Left adrenal gland is normal. Kidneys: Symmetric renal enhancement. No hydronephrosis. No excretion on delayed phase imaging bilaterally. Stomach/Bowel: Stomach physiologically distended. There are no dilated or thickened small bowel loops. Small volume of stool throughout the colon without colonic wall thickening. Mild distal colonic diverticulosis without acute diverticulitis. The appendix is normal. Vascular/Lymphatic: No retroperitoneal adenopathy. Prominence celiac axis node measuring 1.6 cm is unchanged. Abdominal aorta is normal in caliber. Mild atherosclerosis. Replaced right hepatic artery. There is a retro aortic left renal vein. Reproductive: Normal sized prostate gland. Bladder: Minimally distended. Mild wall thickening may be secondary to degree of distension. Other: Small to moderate intra-abdominal ascites, most prominent in the right upper quadrant. Mild mesenteric edema. No loculated fluid  collection/abscess. Post right inguinal hernia repair. Minimal whole body wall edema. Musculoskeletal: There are no acute or suspicious osseous abnormalities. Degenerative change in the spine. IMPRESSION: 1. Equivocal urinary bladder wall thickening, may be due to degree of distension, however recommend correlation with urinalysis to exclude urinary tract infection. 2. Small to moderate volume intra-abdominal ascites. 3. Normal appendix. 4. Lack of renal excretion on delayed phase imaging, can be seen with underlying renal dysfunction. Recommend continued clinical follow-up. Electronically Signed   By: Jeb Levering M.D.   On: 03/20/2016 01:00     Scheduled Meds: . aspirin EC  81 mg Oral Daily  . atorvastatin  20 mg Oral q1800  . bisacodyl  10 mg Rectal Once  .  digoxin  0.25 mg Oral Daily  . enoxaparin (LOVENOX) injection  40 mg Subcutaneous Q24H  . escitalopram  20 mg Oral Daily  . insulin aspart  0-5 Units Subcutaneous QHS  . insulin aspart  0-9 Units Subcutaneous TID WC  . insulin glargine  20 Units Subcutaneous QHS  . isosorbide mononitrate  30 mg Oral Daily  . losartan  25 mg Oral BID  . metoCLOPramide  10 mg Oral TID AC  . polyethylene glycol  17 g Oral Daily  . potassium chloride SA  20 mEq Oral BID  . senna  2 tablet Oral QHS  . sodium chloride flush  3 mL Intravenous Q12H  . spironolactone  25 mg Oral Daily  . torsemide  40 mg Oral BID   Continuous Infusions:    LOS: 2 days    Time spent: 30 min    Janece Canterbury, MD [facility] Pager [phone]  If 7PM-7AM, please contact night-coverage [email or web address] Password [facility] 03/21/2016, 3:24 PM

## 2016-03-22 ENCOUNTER — Encounter (HOSPITAL_COMMUNITY): Payer: Self-pay | Admitting: Internal Medicine

## 2016-03-22 ENCOUNTER — Encounter (HOSPITAL_COMMUNITY)
Admission: EM | Disposition: A | Discharge status: Home Health | Payer: Self-pay | Source: Home / Self Care | Attending: Internal Medicine

## 2016-03-22 DIAGNOSIS — E1169 Type 2 diabetes mellitus with other specified complication: Secondary | ICD-10-CM | POA: Insufficient documentation

## 2016-03-22 DIAGNOSIS — I9589 Other hypotension: Secondary | ICD-10-CM

## 2016-03-22 DIAGNOSIS — E871 Hypo-osmolality and hyponatremia: Secondary | ICD-10-CM

## 2016-03-22 DIAGNOSIS — E785 Hyperlipidemia, unspecified: Secondary | ICD-10-CM

## 2016-03-22 DIAGNOSIS — R079 Chest pain, unspecified: Secondary | ICD-10-CM | POA: Insufficient documentation

## 2016-03-22 HISTORY — PX: CARDIAC CATHETERIZATION: SHX172

## 2016-03-22 LAB — GLUCOSE, CAPILLARY
GLUCOSE-CAPILLARY: 116 mg/dL — AB (ref 65–99)
GLUCOSE-CAPILLARY: 286 mg/dL — AB (ref 65–99)
GLUCOSE-CAPILLARY: 307 mg/dL — AB (ref 65–99)
Glucose-Capillary: 139 mg/dL — ABNORMAL HIGH (ref 65–99)
Glucose-Capillary: 219 mg/dL — ABNORMAL HIGH (ref 65–99)

## 2016-03-22 LAB — CBC
HEMATOCRIT: 33.7 % — AB (ref 39.0–52.0)
HEMATOCRIT: 34.7 % — AB (ref 39.0–52.0)
HEMOGLOBIN: 11.2 g/dL — AB (ref 13.0–17.0)
Hemoglobin: 11 g/dL — ABNORMAL LOW (ref 13.0–17.0)
MCH: 28.9 pg (ref 26.0–34.0)
MCH: 29.3 pg (ref 26.0–34.0)
MCHC: 32.6 g/dL (ref 30.0–36.0)
MCHC: 32.3 g/dL (ref 30.0–36.0)
MCV: 88.5 fL (ref 78.0–100.0)
MCV: 90.8 fL (ref 78.0–100.0)
PLATELETS: 150 10*3/uL (ref 150–400)
Platelets: 156 10*3/uL (ref 150–400)
RBC: 3.81 MIL/uL — AB (ref 4.22–5.81)
RBC: 3.82 MIL/uL — ABNORMAL LOW (ref 4.22–5.81)
RDW: 15.8 % — AB (ref 11.5–15.5)
RDW: 16.4 % — ABNORMAL HIGH (ref 11.5–15.5)
WBC: 6.5 10*3/uL (ref 4.0–10.5)
WBC: 6.3 10*3/uL (ref 4.0–10.5)

## 2016-03-22 LAB — BASIC METABOLIC PANEL
ANION GAP: 7 (ref 5–15)
BUN: 47 mg/dL — AB (ref 6–20)
CO2: 28 mmol/L (ref 22–32)
Calcium: 8.3 mg/dL — ABNORMAL LOW (ref 8.9–10.3)
Chloride: 93 mmol/L — ABNORMAL LOW (ref 101–111)
Creatinine, Ser: 1.28 mg/dL — ABNORMAL HIGH (ref 0.61–1.24)
GFR calc Af Amer: >60 mL/min (ref >60)
GFR, EST NON AFRICAN AMERICAN: 60 mL/min — AB (ref >60)
GLUCOSE: 156 mg/dL — AB (ref 65–99)
POTASSIUM: 4.2 mmol/L (ref 3.5–5.1)
Sodium: 128 mmol/L — ABNORMAL LOW (ref 135–145)

## 2016-03-22 LAB — POCT I-STAT 3, VENOUS BLOOD GAS (G3P V)
ACID-BASE EXCESS: 4 mmol/L — AB (ref 0.0–2.0)
Acid-Base Excess: 5 mmol/L — ABNORMAL HIGH (ref 0.0–2.0)
BICARBONATE: 31 meq/L — AB (ref 20.0–24.0)
Bicarbonate: 31.1 mEq/L — ABNORMAL HIGH (ref 20.0–24.0)
O2 SAT: 48 %
O2 SAT: 51 %
PCO2 VEN: 53.3 mmHg — AB (ref 45.0–50.0)
PO2 VEN: 28 mmHg — AB (ref 31.0–45.0)
PO2 VEN: 28 mmHg — AB (ref 31.0–45.0)
TCO2: 33 mmol/L (ref 0–100)
TCO2: 33 mmol/L (ref 0–100)
pCO2, Ven: 54 mmHg — ABNORMAL HIGH (ref 45.0–50.0)
pH, Ven: 7.367 — ABNORMAL HIGH (ref 7.250–7.300)
pH, Ven: 7.374 — ABNORMAL HIGH (ref 7.250–7.300)

## 2016-03-22 LAB — CARBOXYHEMOGLOBIN
Carboxyhemoglobin: 1.2 % (ref 0.5–1.5)
METHEMOGLOBIN: 1 % (ref 0.0–1.5)
O2 SAT: 60.7 %
TOTAL HEMOGLOBIN: 11.5 g/dL — AB (ref 13.5–18.0)

## 2016-03-22 LAB — CORTISOL-AM, BLOOD: Cortisol - AM: 8.3 ug/dL (ref 6.7–22.6)

## 2016-03-22 LAB — CREATININE, SERUM
Creatinine, Ser: 1.33 mg/dL — ABNORMAL HIGH (ref 0.61–1.24)
GFR calc Af Amer: >60 mL/min (ref >60)
GFR calc non Af Amer: 57 mL/min — ABNORMAL LOW (ref >60)

## 2016-03-22 LAB — PROTIME-INR
INR: 1.25 (ref 0.00–1.49)
Prothrombin Time: 15.8 seconds — ABNORMAL HIGH (ref 11.6–15.2)

## 2016-03-22 SURGERY — RIGHT HEART CATH
Anesthesia: LOCAL

## 2016-03-22 MED ORDER — INSULIN ASPART 100 UNIT/ML ~~LOC~~ SOLN
0.0000 [IU] | Freq: Three times a day (TID) | SUBCUTANEOUS | Status: DC
  Administered 2016-03-22: 3 [IU] via SUBCUTANEOUS
  Administered 2016-03-23: 2 [IU] via SUBCUTANEOUS
  Administered 2016-03-23: 3 [IU] via SUBCUTANEOUS
  Administered 2016-03-23: 2 [IU] via SUBCUTANEOUS
  Administered 2016-03-25: 1 [IU] via SUBCUTANEOUS
  Administered 2016-03-25: 2 [IU] via SUBCUTANEOUS

## 2016-03-22 MED ORDER — LIDOCAINE HCL (PF) 1 % IJ SOLN
INTRAMUSCULAR | Status: AC
  Filled 2016-03-22: qty 30

## 2016-03-22 MED ORDER — MIDAZOLAM HCL 2 MG/2ML IJ SOLN
INTRAMUSCULAR | Status: DC | PRN
  Administered 2016-03-22: 1 mg via INTRAVENOUS

## 2016-03-22 MED ORDER — ASPIRIN 81 MG PO CHEW: 81.0000 mg | CHEWABLE_TABLET | ORAL | Status: DC

## 2016-03-22 MED ORDER — TRAMADOL HCL 50 MG PO TABS
100.0000 mg | ORAL_TABLET | Freq: Three times a day (TID) | ORAL | Status: DC | PRN
  Administered 2016-03-22 – 2016-03-23 (×4): 100 mg via ORAL
  Filled 2016-03-22 (×4): qty 2

## 2016-03-22 MED ORDER — LIDOCAINE HCL (PF) 1 % IJ SOLN
INTRAMUSCULAR | Status: DC | PRN
  Administered 2016-03-22: 10 mL via SUBCUTANEOUS

## 2016-03-22 MED ORDER — SODIUM CHLORIDE 0.9 % IV SOLN: 250.0000 mL | INTRAVENOUS | Status: DC | PRN

## 2016-03-22 MED ORDER — ONDANSETRON HCL 4 MG/2ML IJ SOLN: 4.0000 mg | Freq: Four times a day (QID) | INTRAMUSCULAR | Status: DC | PRN

## 2016-03-22 MED ORDER — HEPARIN (PORCINE) IN NACL 2-0.9 UNIT/ML-% IJ SOLN
INTRAMUSCULAR | Status: DC | PRN
  Administered 2016-03-22: 500 mL

## 2016-03-22 MED ORDER — MIDAZOLAM HCL 2 MG/2ML IJ SOLN
INTRAMUSCULAR | Status: AC
  Filled 2016-03-22: qty 2

## 2016-03-22 MED ORDER — SODIUM CHLORIDE 0.9% FLUSH
3.0000 mL | Freq: Two times a day (BID) | INTRAVENOUS | Status: DC
  Administered 2016-03-22 – 2016-03-23 (×4): 3 mL via INTRAVENOUS

## 2016-03-22 MED ORDER — SODIUM CHLORIDE 0.9% FLUSH: 3.0000 mL | Freq: Two times a day (BID) | INTRAVENOUS | Status: DC

## 2016-03-22 MED ORDER — ENOXAPARIN SODIUM 40 MG/0.4ML ~~LOC~~ SOLN
40.0000 mg | SUBCUTANEOUS | Status: DC
  Administered 2016-03-23 – 2016-03-25 (×3): 40 mg via SUBCUTANEOUS
  Filled 2016-03-22 (×3): qty 0.4

## 2016-03-22 MED ORDER — SODIUM CHLORIDE 0.9% FLUSH: 3.0000 mL | INTRAVENOUS | Status: DC | PRN

## 2016-03-22 MED ORDER — ASPIRIN 81 MG PO CHEW
81.0000 mg | CHEWABLE_TABLET | ORAL | Status: AC
  Administered 2016-03-22: 81 mg via ORAL
  Filled 2016-03-22: qty 1

## 2016-03-22 MED ORDER — FENTANYL CITRATE (PF) 100 MCG/2ML IJ SOLN
INTRAMUSCULAR | Status: AC
  Filled 2016-03-22: qty 2

## 2016-03-22 MED ORDER — ASPIRIN EC 81 MG PO TBEC
81.0000 mg | DELAYED_RELEASE_TABLET | Freq: Every day | ORAL | Status: DC
  Administered 2016-03-23 – 2016-03-24 (×2): 81 mg via ORAL
  Filled 2016-03-22 (×2): qty 1

## 2016-03-22 MED ORDER — ONDANSETRON HCL 4 MG/2ML IJ SOLN
INTRAMUSCULAR | Status: AC
Start: 2016-03-22 — End: 2016-03-22
  Filled 2016-03-22: qty 2

## 2016-03-22 MED ORDER — HEPARIN (PORCINE) IN NACL 2-0.9 UNIT/ML-% IJ SOLN
INTRAMUSCULAR | Status: AC
  Filled 2016-03-22: qty 500

## 2016-03-22 MED ORDER — ONDANSETRON HCL 4 MG/2ML IJ SOLN
INTRAMUSCULAR | Status: DC | PRN
  Administered 2016-03-22: 4 mg via INTRAVENOUS

## 2016-03-22 MED ORDER — ACETAMINOPHEN 325 MG PO TABS
650.0000 mg | ORAL_TABLET | ORAL | Status: DC | PRN
  Administered 2016-03-22: 650 mg via ORAL
  Filled 2016-03-22: qty 2

## 2016-03-22 MED ORDER — MILRINONE LACTATE IN DEXTROSE 20-5 MG/100ML-% IV SOLN
0.2500 ug/kg/min | INTRAVENOUS | Status: DC
  Administered 2016-03-22 – 2016-03-25 (×4): 0.25 ug/kg/min via INTRAVENOUS
  Filled 2016-03-22 (×7): qty 100

## 2016-03-22 MED ORDER — FUROSEMIDE 10 MG/ML IJ SOLN
80.0000 mg | Freq: Two times a day (BID) | INTRAMUSCULAR | Status: DC
  Administered 2016-03-22 – 2016-03-23 (×4): 80 mg via INTRAVENOUS
  Filled 2016-03-22 (×4): qty 8

## 2016-03-22 MED ORDER — SODIUM CHLORIDE 0.9% FLUSH
3.0000 mL | Freq: Two times a day (BID) | INTRAVENOUS | Status: DC
  Administered 2016-03-22 – 2016-03-24 (×5): 3 mL via INTRAVENOUS

## 2016-03-22 MED ORDER — SODIUM CHLORIDE 0.9 % IV SOLN
INTRAVENOUS | Status: DC
Start: 2016-03-23 — End: 2016-03-22
  Administered 2016-03-22: 07:00:00 via INTRAVENOUS

## 2016-03-22 MED ORDER — FENTANYL CITRATE (PF) 100 MCG/2ML IJ SOLN
INTRAMUSCULAR | Status: DC | PRN
  Administered 2016-03-22: 25 ug via INTRAVENOUS

## 2016-03-22 SURGICAL SUPPLY — 7 items
CATH SWAN GANZ 7F STRAIGHT (CATHETERS) ×2 IMPLANT
PACK CARDIAC CATHETERIZATION (CUSTOM PROCEDURE TRAY) ×2 IMPLANT
SHEATH PINNACLE 7F 10CM (SHEATH) ×2 IMPLANT
SLEEVE REPOSITIONING LENGTH 30 (MISCELLANEOUS) ×2 IMPLANT
TRANSDUCER W/STOPCOCK (MISCELLANEOUS) ×2 IMPLANT
TUBING ART PRESS 72  MALE/FEM (TUBING) ×1
TUBING ART PRESS 72 MALE/FEM (TUBING) ×1 IMPLANT

## 2016-03-22 NOTE — Progress Notes (Signed)
Advanced Heart Failure Rounding Note   Subjective:    Yesterday diuresed with IV lasix. Negative 1.4 liters but weight up.   Complaining of back pain. Mild dyspnea with exertion.   Objective:   Weight Range:  Vital Signs:   Temp:  [97.3 F (36.3 C)-98.4 F (36.9 C)] 98 F (36.7 C) (06/19 0420) Pulse Rate:  [76-80] 76 (06/19 0420) Resp:  [20] 20 (06/19 0420) BP: (88-107)/(61-70) 107/66 mmHg (06/19 0420) SpO2:  [93 %-96 %] 96 % (06/19 0420) Weight:  [241 lb 1.6 oz (109.362 kg)] 241 lb 1.6 oz (109.362 kg) (06/19 0420) Last BM Date: 03/22/16  Weight change: Filed Weights   03/20/16 0503 03/21/16 0626 03/22/16 0420  Weight: 238 lb (107.956 kg) 239 lb 11.2 oz (108.727 kg) 241 lb 1.6 oz (109.362 kg)    Intake/Output:   Intake/Output Summary (Last 24 hours) at 03/22/16 0913 Last data filed at 03/22/16 0000  Gross per 24 hour  Intake    303 ml  Output   1776 ml  Net  -1473 ml    PHYSICAL EXAM: General: lying flat in bed NAD HEENT: normal Neck: supple. JVP - hard to see 10.  Carotids 2+ bilat; no bruits. No thyromegaly or nodule noted.  Cor: PMI nondisplaced. RRR. No M/G/R Lungs: CTAB, normal effort Abdomen: Obese, NT, mildly distended, no HSM. No bruits or masses. +BS  Extremities: no cyanosis, clubbing. R and LLE Chronic venous stasis changes with healing wound on R shin Trace-1+ edmea Neuro: alert & oriented x 3, cranial nerves grossly intact. moves all 4 extremities w/o difficulty. Affect pleasant.  Telemetry: AV paced 90s  Labs: Basic Metabolic Panel:  Recent Labs Lab 03/19/16 1856 03/20/16 0646 03/20/16 1554 03/21/16 0219 03/22/16 0213  NA 128* 125* 124* 126* 128*  K 3.9 3.9 4.3 4.0 4.2  CL 89* 88* 88* 90* 93*  CO2 31 28 28 27 28   GLUCOSE 147* 107* 174* 139* 156*  BUN 35* 37* 36* 40* 47*  CREATININE 1.19 1.25* 1.24 1.33* 1.28*  CALCIUM 8.8* 8.3* 8.5* 8.5* 8.3*    Liver Function Tests:  Recent Labs Lab 03/19/16 1214  AST 34  ALT 28    ALKPHOS 106  BILITOT 1.0  PROT 7.0  ALBUMIN 3.7    Recent Labs Lab 03/19/16 1214  LIPASE 25   No results for input(s): AMMONIA in the last 168 hours.  CBC:  Recent Labs Lab 03/19/16 1230 03/21/16 0219 03/22/16 0213  WBC 7.4 6.5 6.5  HGB 11.1* 11.8* 11.0*  HCT 34.5* 35.8* 33.7*  MCV 89.6 88.6 88.5  PLT 162 158 150    Cardiac Enzymes:  Recent Labs Lab 03/19/16 1856 03/20/16 0055 03/20/16 0646  TROPONINI 0.06* 0.05* 0.05*    BNP: BNP (last 3 results)  Recent Labs  03/09/16 1440 03/10/16 0930 03/19/16 1230  BNP 1634.5* 1369.8* 1079.6*    ProBNP (last 3 results) No results for input(s): PROBNP in the last 8760 hours.    Other results:  Imaging: No results found.   Medications:     Scheduled Medications: . aspirin EC  81 mg Oral Daily  . atorvastatin  20 mg Oral q1800  . bisacodyl  10 mg Rectal Once  . digoxin  0.25 mg Oral Daily  . enoxaparin (LOVENOX) injection  40 mg Subcutaneous Q24H  . escitalopram  20 mg Oral Daily  . insulin aspart  0-5 Units Subcutaneous QHS  . insulin aspart  0-9 Units Subcutaneous TID WC  . insulin glargine  20 Units Subcutaneous QHS  . isosorbide mononitrate  30 mg Oral Daily  . losartan  25 mg Oral BID  . metoCLOPramide  10 mg Oral TID AC  . polyethylene glycol  17 g Oral Daily  . potassium chloride SA  20 mEq Oral BID  . senna  2 tablet Oral QHS  . sodium chloride flush  3 mL Intravenous Q12H  . sodium chloride flush  3 mL Intravenous Q12H  . spironolactone  25 mg Oral Daily  . torsemide  40 mg Oral BID    Infusions: . [START ON 03/23/2016] sodium chloride 10 mL/hr at 03/22/16 0643    PRN Medications: sodium chloride, acetaminophen **OR** acetaminophen, acetaminophen, albuterol, baclofen, promethazine, sodium chloride flush, zolpidem   Assessment:   1. Nausea/vomiting 2. 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 Has medtronic ICD.  3. CAD --severe 3v-  CAD s/p CABG with occluded grafts as above except for LIMA. No targets for revascularization. Troponin negative  3. HTN 4. DMII 5. Fatty liver with ascites 6. Ascites --CT shows mild to moderate ascites  Plan/Discussion:   RHC today . +/- inotropes. Adjust diuretics based on results.   He remains at high risk for readmit due to poor insight.   Length of Stay: 3 Amy Clegg NP-C  03/22/2016, 9:13 AM  Advanced Heart Failure Team Pager [phone] (M-F; 7a - 4p)  Please contact [facility] for night-coverage after hours (4p -7a ) and weekends on [email or web address]  Patient seen and examined with Darrick Grinder, NP. We discussed all aspects of the encounter. I agree with the assessment and plan as stated above.   RHC today with high filling pressures and low cardiac output. Started on milrinone. RA/PCWP ratio concerning for severe RV function. Will likely need home milrinone.   Suspect N/V may be related to HF. CAD is stable. Renal function ok  .  Bensimhon, Daniel,MD 4:10 PM

## 2016-03-22 NOTE — Progress Notes (Signed)
Patient ID: Ronald Miller., male   DOB: [birth year], 60 y.o.   MRN: [id number]  PROGRESS NOTE    Ronald Miller.  [id number] DOB: 09/01/1956 DOA: 03/19/2016  PCP: [place]   Brief Narrative:   60 y.o. male with history of CAD s/p CABG in 2000, all grafts down except for LIMA to LAD, chronic systolic heart failure with EF of 15-20% with recurrent hospitalizations for heart failure exacerbation followed by Dr. Haroldine Laws, s/p Medtronic ICD 2014, essential hypertension, cocaine and tobacco abuse, depression, and COPD who presented with nausea, vomiting, abdominal, back and chest pain.  In the ER, BP was 86/65 and initially tachycardic with HR In 120's. His blood work was notable for sodium 125, creatinine at baseline of 1.19. Troponin was 0.06. The 12 lead EKG demonstrated sinus rhythm with occasional PVC, RBBB, Q-waves in inferior leads.T-waves and ST segments were similar to prior EKG. BNP was 1079. CXR showed stable cardiomegaly.  Cardiac cath is planned for this am.   Assessment & Plan:  Chest pain / History of ICD placement / Troponin elevation  - Troponin level 0.05, 0.05 - Plan for cardiac cath this am - Cardio is following - No chest pain this am  Nausea, vomiting and abdominal pain - Possible from gastroparesis from history of diabetes - No acute findings on CT abdomen - LFTs, lipase, and UA were unremarkable.  - He is on regaln 10 mg PO TID - No abd pain this am  Chronic Systolic HF with severe biventricular dysfunction EF 15% due to NICM. Has medtronic ICD. NYHA III. - Plan for cardiac cath this am - Appreciate cardio following - He is lasix 80 mg IV BID (prior to this he was on torsemide) - Continue aspirin  - Continue losartan 25 bid - Continue imdur 30 mg - Continue digoxin 0.25 mg daily  - Continue spironolactone 25 mg daily  Hypotension / Essential hypertension - BP low from heart failure. - On milrinone drip but also on  imdur, losartan   CAD - Severe 3v- CAD s/p CABG with occluded grafts except for LIMA to LAD. No targets for revascularization.  - Continue aspirin, statin - No beta blocker due to hypotension  Diabetes mellitus type 2 with circulatory complications with long term insulin use  - A1c 8.4 in 02/2016 - Continue Lantus 20 units at bedtime and SSI - CBG's in past 24 hours: 213, 139, 116  Dyslipidemia associated with type 2 DM - Continue statin therapy   Morbid obesity due to excess calories - Body mass index is 35.63 kg/(m^2) with comorbidities hypertension, diabetes, dyslipidemia   Hyponatremia - Due to CHF etiology - Continue to monitor daily BMP  Depression - Continue lexapro    DVT prophylaxis: Lovenox subQ  Code Status: full code  Family Communication: no family at the bedside this am  Disposition Plan: cath today    Consultants:   Cardiology   Procedures:   Cath this am   Antimicrobials:   None    Subjective: No overnight events.   Objective: Filed Vitals:   03/22/16 1300 03/22/16 1400 03/22/16 1500 03/22/16 1557  BP:  87/55 92/60   Pulse:      Temp: 96.8 F (36 C) 97 F (36.1 C) 96.8 F (36 C) 96.8 F (36 C)  TempSrc:      Resp: 20 16 19 15   Height:      Weight:      SpO2:  Intake/Output Summary (Last 24 hours) at 03/22/16 1615 Last data filed at 03/22/16 1500  Gross per 24 hour  Intake 360.66 ml  Output   2450 ml  Net -2089.34 ml   Filed Weights   03/21/16 0626 03/22/16 0420 03/22/16 1048  Weight: 108.727 kg (239 lb 11.2 oz) 109.362 kg (241 lb 1.6 oz) 109.498 kg (241 lb 6.4 oz)    Examination:  General exam: Appears calm and comfortable  Respiratory system: Clear to auscultation. Respiratory effort normal. Cardiovascular system: S1 & S2 heard, Rate controlled Gastrointestinal system: Abdomen is nondistended, soft and nontender. No organomegaly or masses felt. Normal bowel sounds heard. Central nervous system: Alert and  oriented. No focal neurological deficits. Extremities: Symmetric 5 x 5 power. Skin: venous stasis and (+1) LE edema Psychiatry: Judgement and insight appear normal. Mood & affect appropriate.   Data Reviewed: I have personally reviewed following labs and imaging studies  CBC:  Recent Labs Lab 03/19/16 1230 03/21/16 0219 03/22/16 0213 03/22/16 1400  WBC 7.4 6.5 6.5 6.3  HGB 11.1* 11.8* 11.0* 11.2*  HCT 34.5* 35.8* 33.7* 34.7*  MCV 89.6 88.6 88.5 90.8  PLT 162 158 150 [id number]   Basic Metabolic Panel:  Recent Labs Lab 03/19/16 1856 03/20/16 0646 03/20/16 1554 03/21/16 0219 03/22/16 0213 03/22/16 1400  NA 128* 125* 124* 126* 128*  --   K 3.9 3.9 4.3 4.0 4.2  --   CL 89* 88* 88* 90* 93*  --   CO2 31 28 28 27 28   --   GLUCOSE 147* 107* 174* 139* 156*  --   BUN 35* 37* 36* 40* 47*  --   CREATININE 1.19 1.25* 1.24 1.33* 1.28* 1.33*  CALCIUM 8.8* 8.3* 8.5* 8.5* 8.3*  --    GFR: Estimated Creatinine Clearance: 72.9 mL/min (by C-G formula based on Cr of 1.33). Liver Function Tests:  Recent Labs Lab 03/19/16 1214  AST 34  ALT 28  ALKPHOS 106  BILITOT 1.0  PROT 7.0  ALBUMIN 3.7    Recent Labs Lab 03/19/16 1214  LIPASE 25   No results for input(s): AMMONIA in the last 168 hours. Coagulation Profile:  Recent Labs Lab 03/22/16 0213  INR 1.25   Cardiac Enzymes:  Recent Labs Lab 03/19/16 1856 03/20/16 0055 03/20/16 0646  TROPONINI 0.06* 0.05* 0.05*   BNP (last 3 results) No results for input(s): PROBNP in the last 8760 hours. HbA1C: No results for input(s): HGBA1C in the last 72 hours. CBG:  Recent Labs Lab 03/21/16 1234 03/21/16 1640 03/21/16 2048 03/22/16 0608 03/22/16 1139  GLUCAP 195* 155* 213* 139* 116*   Lipid Profile: No results for input(s): CHOL, HDL, LDLCALC, TRIG, CHOLHDL, LDLDIRECT in the last 72 hours. Thyroid Function Tests:  Recent Labs  03/20/16 1644  TSH 1.481   Anemia Panel: No results for input(s): VITAMINB12, FOLATE,  FERRITIN, TIBC, IRON, RETICCTPCT in the last 72 hours.  Sepsis Labs: @LABRCNTIP (procalcitonin:4,lacticidven:4)   )No results found for this or any previous visit (from the past 240 hour(s)).    Radiology Studies: Dg Chest 2 View 03/19/2016   Stable cardiomegaly. Pacemaker leads unchanged. No edema or consolidation. Electronically Signed   By: Lowella Grip III M.D.   On: 03/19/2016 13:42   Ct Abdomen Pelvis W Contrast 03/20/2016  1. Equivocal urinary bladder wall thickening, may be due to degree of distension, however recommend correlation with urinalysis to exclude urinary tract infection. 2. Small to moderate volume intra-abdominal ascites. 3. Normal appendix. 4. Lack of renal excretion on  delayed phase imaging, can be seen with underlying renal dysfunction. Recommend continued clinical follow-up. Electronically Signed   By: Jeb Levering M.D.   On: 03/20/2016 01:00     Scheduled Meds: . [START ON 03/23/2016] aspirin EC  81 mg Oral Daily  . atorvastatin  20 mg Oral q1800  . bisacodyl  10 mg Rectal Once  . digoxin  0.25 mg Oral Daily  . [START ON 03/23/2016] enoxaparin (LOVENOX) injection  40 mg Subcutaneous Q24H  . escitalopram  20 mg Oral Daily  . furosemide  80 mg Intravenous BID  . insulin aspart  0-5 Units Subcutaneous QHS  . insulin aspart  0-9 Units Subcutaneous TID WC  . insulin glargine  20 Units Subcutaneous QHS  . isosorbide mononitrate  30 mg Oral Daily  . losartan  25 mg Oral BID  . metoCLOPramide  10 mg Oral TID AC  . polyethylene glycol  17 g Oral Daily  . potassium chloride SA  20 mEq Oral BID  . senna  2 tablet Oral QHS  . sodium chloride flush  3 mL Intravenous Q12H  . sodium chloride flush  3 mL Intravenous Q12H  . sodium chloride flush  3 mL Intravenous Q12H  . spironolactone  25 mg Oral Daily   Continuous Infusions: . milrinone 0.25 mcg/kg/min (03/22/16 1140)     LOS: 3 days    Time spent: 25 minutes  Greater than 50% of the time spent on  counseling and coordinating the care.   Leisa Lenz, MD [facility] Pager [phone]  If 7PM-7AM, please contact night-coverage [email or web address] Password [email or web address] 03/22/2016, 4:15 PM

## 2016-03-22 NOTE — Progress Notes (Signed)
MD paged about pt's neck pain at insertion site of swan.  Awaiting orders.

## 2016-03-22 NOTE — Progress Notes (Signed)
Refused bed alarm. Will continue to monitor patient. 

## 2016-03-22 NOTE — Care Management Important Message (Signed)
Important Message  Patient Details  Name: Ronald Miller. MRN: [id number] Date of Birth: 1956-07-22   Medicare Important Message Given:  Yes    Loann Quill 03/22/2016, 10:14 AM

## 2016-03-23 ENCOUNTER — Encounter (HOSPITAL_COMMUNITY): Payer: Medicare HMO

## 2016-03-23 ENCOUNTER — Inpatient Hospital Stay (HOSPITAL_COMMUNITY): Admission: RE | Admit: 2016-03-23 | Payer: Medicare HMO | Source: Ambulatory Visit | Admitting: Internal Medicine

## 2016-03-23 DIAGNOSIS — I5021 Acute systolic (congestive) heart failure: Secondary | ICD-10-CM | POA: Insufficient documentation

## 2016-03-23 DIAGNOSIS — R570 Cardiogenic shock: Secondary | ICD-10-CM

## 2016-03-23 LAB — CBC
HEMATOCRIT: 34.3 % — AB (ref 39.0–52.0)
HEMOGLOBIN: 10.9 g/dL — AB (ref 13.0–17.0)
MCH: 29.3 pg (ref 26.0–34.0)
MCHC: 31.8 g/dL (ref 30.0–36.0)
MCV: 92.2 fL (ref 78.0–100.0)
PLATELETS: 163 10*3/uL (ref 150–400)
RBC: 3.72 MIL/uL — AB (ref 4.22–5.81)
RDW: 16.4 % — ABNORMAL HIGH (ref 11.5–15.5)
WBC: 6.4 10*3/uL (ref 4.0–10.5)

## 2016-03-23 LAB — BASIC METABOLIC PANEL
Anion gap: 6 (ref 5–15)
BUN: 33 mg/dL — ABNORMAL HIGH (ref 6–20)
CHLORIDE: 96 mmol/L — AB (ref 101–111)
CO2: 32 mmol/L (ref 22–32)
CREATININE: 1.16 mg/dL (ref 0.61–1.24)
Calcium: 8.4 mg/dL — ABNORMAL LOW (ref 8.9–10.3)
GFR calc non Af Amer: >60 mL/min (ref >60)
Glucose, Bld: 178 mg/dL — ABNORMAL HIGH (ref 65–99)
Potassium: 4 mmol/L (ref 3.5–5.1)
Sodium: 134 mmol/L — ABNORMAL LOW (ref 135–145)

## 2016-03-23 LAB — URINALYSIS, ROUTINE W REFLEX MICROSCOPIC
BILIRUBIN URINE: NEGATIVE
GLUCOSE, UA: NEGATIVE mg/dL
HGB URINE DIPSTICK: NEGATIVE
KETONES UR: NEGATIVE mg/dL
Leukocytes, UA: NEGATIVE
Nitrite: NEGATIVE
PROTEIN: NEGATIVE mg/dL
Specific Gravity, Urine: 1.013 (ref 1.005–1.030)
pH: 7 (ref 5.0–8.0)

## 2016-03-23 LAB — CARBOXYHEMOGLOBIN
CARBOXYHEMOGLOBIN: 1.7 % — AB (ref 0.5–1.5)
METHEMOGLOBIN: 0.6 % (ref 0.0–1.5)
O2 Saturation: 58.4 %
Total hemoglobin: 11.3 g/dL — ABNORMAL LOW (ref 13.5–18.0)

## 2016-03-23 LAB — GLUCOSE, CAPILLARY
GLUCOSE-CAPILLARY: 169 mg/dL — AB (ref 65–99)
GLUCOSE-CAPILLARY: 194 mg/dL — AB (ref 65–99)
Glucose-Capillary: 225 mg/dL — ABNORMAL HIGH (ref 65–99)
Glucose-Capillary: 154 mg/dL — ABNORMAL HIGH (ref 65–99)

## 2016-03-23 LAB — MAGNESIUM: Magnesium: 1.9 mg/dL (ref 1.7–2.4)

## 2016-03-23 MED ORDER — SODIUM CHLORIDE 0.9% FLUSH: 10.0000 mL | INTRAVENOUS | Status: DC | PRN

## 2016-03-23 MED ORDER — SODIUM CHLORIDE 0.9% FLUSH: 10.0000 mL | Freq: Two times a day (BID) | INTRAVENOUS | Status: DC

## 2016-03-23 MED ORDER — INSULIN ASPART 100 UNIT/ML ~~LOC~~ SOLN
5.0000 [IU] | Freq: Three times a day (TID) | SUBCUTANEOUS | Status: DC
  Administered 2016-03-23 – 2016-03-25 (×8): 5 [IU] via SUBCUTANEOUS

## 2016-03-23 MED ORDER — METOLAZONE 5 MG PO TABS
5.0000 mg | ORAL_TABLET | Freq: Two times a day (BID) | ORAL | Status: DC
  Administered 2016-03-23 (×2): 5 mg via ORAL
  Filled 2016-03-23 (×2): qty 1

## 2016-03-23 MED ORDER — MAGNESIUM SULFATE 2 GM/50ML IV SOLN
2.0000 g | Freq: Once | INTRAVENOUS | Status: AC
  Administered 2016-03-23: 2 g via INTRAVENOUS
  Filled 2016-03-23: qty 50

## 2016-03-23 NOTE — Progress Notes (Signed)
Advanced Heart Failure Rounding Note   Subjective:    Yesterday had RHC  As noted below. Milrinone started and diuresed with IV lasix.  Brisk diuresis noted.   Feeling better. Less dyspnea and bloating.   Todays CO-OX 58%.   [place]wan numbers CVP 15  PAP 66/35 (45) PCWP 27 CO/CI 6.4/2.9 RA/PCWP 0.56   RHC 6/19 RA = 23 RV = 46/25 PA = 49/28 (36) PCW = 27 Fick cardiac output/index = 4.9/2.2 Thermo CO/Ci = 4.1/1.8 PVR = 2.2 WU Ao sat = 89% PA sat = 49%, 53% RA/PCWP = 0.85  Assessment:  1. Low cardiac output due to biventricular failure  Objective:   Weight Range:  Vital Signs:   Temp:  [96.6 F (35.9 C)-97.4 F (36.3 C)] 96.6 F (35.9 C) (06/20 0600) Pulse Rate:  [0-87] 83 (06/20 0600) Resp:  [9-131] 18 (06/20 0600) BP: (80-117)/(53-87) 109/78 mmHg (06/20 0600) SpO2:  [0 %-98 %] 89 % (06/20 0600) Weight:  [235 lb 11.2 oz (106.913 kg)-241 lb 6.4 oz (109.498 kg)] 235 lb 11.2 oz (106.913 kg) (06/20 0500) Last BM Date: 03/22/16  Weight change: Filed Weights   03/22/16 0420 03/22/16 1048 03/23/16 0500  Weight: 241 lb 1.6 oz (109.362 kg) 241 lb 6.4 oz (109.498 kg) 235 lb 11.2 oz (106.913 kg)    Intake/Output:   Intake/Output Summary (Last 24 hours) at 03/23/16 0713 Last data filed at 03/23/16 0600  Gross per 24 hour  Intake 1034.66 ml  Output   4275 ml  Net -3240.34 ml    PHYSICAL EXAM: CVP 14 General: lying flat in bed NAD HEENT: normal Neck: supple. JCarotids 2+ bilat; no bruits. No thyromegaly or nodule noted. RIJ swan.  Cor: PMI laterally displaced. RRR. No M/G/R Lungs: CTAB, normal effort Abdomen: Obese, NT, mildly distended, no HSM. No bruits or masses. +BS  Extremities: no cyanosis, clubbing. R and LLE Chronic venous stasis changes with healing wound on R shin Trace-1+ edmea Neuro: alert & oriented x 3, cranial nerves grossly intact. moves all 4 extremities w/o difficulty. Affect pleasant.  Telemetry: AV paced 90s  Labs: Basic Metabolic  Panel:  Recent Labs Lab 03/20/16 0646 03/20/16 1554 03/21/16 0219 03/22/16 0213 03/22/16 1400 03/23/16 0345  NA 125* 124* 126* 128*  --  134*  K 3.9 4.3 4.0 4.2  --  4.0  CL 88* 88* 90* 93*  --  96*  CO2 28 28 27 28   --  32  GLUCOSE 107* 174* 139* 156*  --  178*  BUN 37* 36* 40* 47*  --  33*  CREATININE 1.25* 1.24 1.33* 1.28* 1.33* 1.16  CALCIUM 8.3* 8.5* 8.5* 8.3*  --  8.4*  MG  --   --   --   --   --  1.9    Liver Function Tests:  Recent Labs Lab 03/19/16 1214  AST 34  ALT 28  ALKPHOS 106  BILITOT 1.0  PROT 7.0  ALBUMIN 3.7    Recent Labs Lab 03/19/16 1214  LIPASE 25   No results for input(s): AMMONIA in the last 168 hours.  CBC:  Recent Labs Lab 03/19/16 1230 03/21/16 0219 03/22/16 0213 03/22/16 1400 03/23/16 0345  WBC 7.4 6.5 6.5 6.3 6.4  HGB 11.1* 11.8* 11.0* 11.2* 10.9*  HCT 34.5* 35.8* 33.7* 34.7* 34.3*  MCV 89.6 88.6 88.5 90.8 92.2  PLT 162 158 150 156 163    Cardiac Enzymes:  Recent Labs Lab 03/19/16 1856 03/20/16 0055 03/20/16 0646  TROPONINI 0.06*  0.05* 0.05*    BNP: BNP (last 3 results)  Recent Labs  03/09/16 1440 03/10/16 0930 03/19/16 1230  BNP 1634.5* 1369.8* 1079.6*    ProBNP (last 3 results) No results for input(s): PROBNP in the last 8760 hours.    Other results:  Imaging: No results found.   Medications:     Scheduled Medications: . aspirin EC  81 mg Oral Daily  . atorvastatin  20 mg Oral q1800  . bisacodyl  10 mg Rectal Once  . digoxin  0.25 mg Oral Daily  . enoxaparin (LOVENOX) injection  40 mg Subcutaneous Q24H  . escitalopram  20 mg Oral Daily  . furosemide  80 mg Intravenous BID  . insulin aspart  0-5 Units Subcutaneous QHS  . insulin aspart  0-9 Units Subcutaneous TID WC  . insulin aspart  5 Units Subcutaneous TID WC  . insulin glargine  20 Units Subcutaneous QHS  . isosorbide mononitrate  30 mg Oral Daily  . losartan  25 mg Oral BID  . magnesium sulfate 1 - 4 g bolus IVPB  2 g  Intravenous Once  . metoCLOPramide  10 mg Oral TID AC  . polyethylene glycol  17 g Oral Daily  . potassium chloride SA  20 mEq Oral BID  . senna  2 tablet Oral QHS  . sodium chloride flush  3 mL Intravenous Q12H  . sodium chloride flush  3 mL Intravenous Q12H  . sodium chloride flush  3 mL Intravenous Q12H  . spironolactone  25 mg Oral Daily    Infusions: . milrinone 0.25 mcg/kg/min (03/22/16 2000)    PRN Medications: sodium chloride, sodium chloride, acetaminophen, albuterol, baclofen, ondansetron (ZOFRAN) IV, promethazine, sodium chloride flush, sodium chloride flush, traMADol, zolpidem   Assessment:   1. Cardiogenic shock 2. 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 Has medtronic ICD.  3. CAD --severe 3v- CAD s/p CABG with occluded grafts as above except for LIMA. No targets for revascularization. Troponin negative  3. HTN 4. DMII 5. Fatty liver with ascites 6. Ascites --CT shows mild to moderate ascites  Plan/Discussion:   Post cath started on milrinone. Low output with biventricular failure. Doubt candidate for advanced therapies give R sided failure. Volume status improving. Continue IV lasix + 25 mg spiro daily. . Todays CO-OX 58%. Continue milrinone 0.25 mcg + digoxin 0.25 mg daily. SBP soft. Continue losartan 25 mg daily. Renal function stable.   Discussed possible home milrinone. Will need palliative care consult.  He remains at high risk for readmit due to poor insight.   Length of Stay: 4 Amy Clegg NP-C  03/23/2016, 7:13 AM  Advanced Heart Failure Team Pager [phone] (M-F; 7a - 4p)  Please contact [facility] for night-coverage after hours (4p -7a ) and weekends on [email or web address]  Patient seen and examined with Darrick Grinder, NP. We discussed all aspects of the encounter. I agree with the assessment and plan as stated above.   Luiz Blare numbers done personally. Remains on milrinone. CVP coming down but PA pressures  actually up - suspect due to improved RV function. PCWP still high. Will continue milrinone and IV diuresis. Add metolazone. Doubt transplant candidate due to compliance issues. RV failure may prohibit VAD. Plan will be to continue inotropic support and IV diuresis. Suspect he will need home inotropes and see how he does.   The patient is critically ill with multiple organ systems failure and requires high complexity decision making for assessment and  support, frequent evaluation and titration of therapies, application of advanced monitoring technologies and extensive interpretation of multiple databases.   Critical Care Time devoted to patient care services described in this note is 35 Minutes.  Bensimhon, Daniel,MD 2:39 PM

## 2016-03-23 NOTE — Progress Notes (Addendum)
Patient ID: Ronald Damian., male   DOB: 10/05/1955, 60 y.o.   MRN: [id number]  PROGRESS NOTE    Ronald Fusi.  [id number] DOB: April 03, 1956 DOA: 03/19/2016  PCP: Ronald Miller   Brief Narrative:   60 y.o. male with history of CAD s/p CABG in 2000, all grafts down except for LIMA to LAD, chronic systolic heart failure with EF of 15-20% with recurrent hospitalizations for heart failure exacerbation followed by Dr. Haroldine Laws, s/p Medtronic ICD 2014, essential hypertension, cocaine and tobacco abuse, depression, and COPD who presented with nausea, vomiting, abdominal, back and chest pain.  In the ER, BP was 86/65 and initially tachycardic with HR In 120's. His blood work was notable for sodium 125, creatinine at baseline of 1.19. Troponin was 0.06. The 12 lead EKG demonstrated sinus rhythm with occasional PVC, RBBB, Q-waves in inferior leads.T-waves and ST segments were similar to prior EKG. BNP was 1079. CXR showed stable cardiomegaly.  Cardiac cath done 6/19 with low cardiac output and biventricular failure.    Assessment & Plan:  Chest pain / History of ICD placement / Troponin elevation  - Troponin level 0.05, 0.05 - Chest pain free - Cardiac cath with low cardiac output due to biventricular failure - Cardio on board   Nausea, vomiting and abdominal pain - Possible from gastroparesis from history of diabetes - No acute findings on CT abdomen - LFTs, lipase, and UA were unremarkable.  - Continue Regaln 10 mg PO TID  Chronic Systolic HF with severe biventricular dysfunction EF 15% due to NICM. Has medtronic ICD. NYHA III. - Cardiac cath 6/19 demon started low cardiac output due to biventricular failure  - Continue lasix 80 mg IV BID (prior to this he was on torsemide) - Continue aspirin  - Continue losartan 25 bid - Continue imdur 30 mg - Continue digoxin 0.25 mg daily  - Continue spironolactone 25 mg daily - Supplement electrolytes as  needed  Hypotension / Essential hypertension - BP low from heart failure. - Continue milrinone drip   CAD - Severe 3v- CAD s/p CABG with occluded grafts except for LIMA to LAD. No targets for revascularization.  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  - A1c 8.4 in 02/2016 - Currently on Lantus 20 units at bedtime and SSI - CBG's in past 24 hours: 219, 286, 307 - Add novolog with meals 5 units TID  Dyslipidemia associated with type 2 DM - Continue statin therapy   Morbid obesity due to excess calories - Body mass index is 35.63 kg/(m^2) with comorbidities hypertension, diabetes, dyslipidemia  - Counseled on diet, nutrition   Hyponatremia - Due to CHF etiology - Has improved this am to 134 from yesterday's 128  Depression - Continue lexapro  - Stable, does not feel depressed    DVT prophylaxis: Lovenox subQ  Code Status: full code  Family Communication: no family at the bedside this am  Disposition Plan: home once cleared by cardiology    Consultants:   Cardiology   Procedures:   Cardiac cath 03/22/2016 -  Low cardiac output due to biventricular failure   Antimicrobials:   None    Subjective: No overnight events. No respiratory distress.   Objective: Filed Vitals:   03/23/16 0300 03/23/16 0400 03/23/16 0500 03/23/16 0600  BP: 97/79 82/64 87/62  109/78  Pulse: 84 80 80 83  Temp: 96.8 F (36 C) 97.4 F (36.3 C) 96.6 F (35.9 C) 96.6 F (35.9 C)  TempSrc:  Oral    Resp: 15 13 13 18   Height:      Weight:   106.913 kg (235 lb 11.2 oz)   SpO2:  93% 92% 89%    Intake/Output Summary (Last 24 hours) at 03/23/16 0650 Last data filed at 03/23/16 0600  Gross per 24 hour  Intake 1034.66 ml  Output   4275 ml  Net -3240.34 ml   Filed Weights   03/22/16 0420 03/22/16 1048 03/23/16 0500  Weight: 109.362 kg (241 lb 1.6 oz) 109.498  kg (241 lb 6.4 oz) 106.913 kg (235 lb 11.2 oz)    Examination:  General exam: no acute distress  Respiratory system: No wheezing or rhonchi  Cardiovascular system: S1 & S2 heard, RRR Gastrointestinal system: (+) BS, non tender abdomen, obese Central nervous system: No focal neurological deficits. Extremities: LE edema, palpable pulses  Skin: venous stasis changes bialtereally Psychiatry: Normal mood and behavior   Data Reviewed: I have personally reviewed following labs and imaging studies  CBC:  Recent Labs Lab 03/19/16 1230 03/21/16 0219 03/22/16 0213 03/22/16 1400 03/23/16 0345  WBC 7.4 6.5 6.5 6.3 6.4  HGB 11.1* 11.8* 11.0* 11.2* 10.9*  HCT 34.5* 35.8* 33.7* 34.7* 34.3*  MCV 89.6 88.6 88.5 90.8 92.2  PLT 162 158 150 156 [id number]   Basic Metabolic Panel:  Recent Labs Lab 03/20/16 0646 03/20/16 1554 03/21/16 0219 03/22/16 0213 03/22/16 1400 03/23/16 0345  NA 125* 124* 126* 128*  --  134*  K 3.9 4.3 4.0 4.2  --  4.0  CL 88* 88* 90* 93*  --  96*  CO2 28 28 27 28   --  32  GLUCOSE 107* 174* 139* 156*  --  178*  BUN 37* 36* 40* 47*  --  33*  CREATININE 1.25* 1.24 1.33* 1.28* 1.33* 1.16  CALCIUM 8.3* 8.5* 8.5* 8.3*  --  8.4*  MG  --   --   --   --   --  1.9   GFR: Estimated Creatinine Clearance: 82.6 mL/min (by C-G formula based on Cr of 1.16). Liver Function Tests:  Recent Labs Lab 03/19/16 1214  AST 34  ALT 28  ALKPHOS 106  BILITOT 1.0  PROT 7.0  ALBUMIN 3.7    Recent Labs Lab 03/19/16 1214  LIPASE 25   No results for input(s): AMMONIA in the last 168 hours. Coagulation Profile:  Recent Labs Lab 03/22/16 0213  INR 1.25   Cardiac Enzymes:  Recent Labs Lab 03/19/16 1856 03/20/16 0055 03/20/16 0646  TROPONINI 0.06* 0.05* 0.05*   BNP (last 3 results) No results for input(s): PROBNP in the last 8760 hours. HbA1C: No results for input(s): HGBA1C in the last 72 hours. CBG:  Recent Labs Lab 03/22/16 0608 03/22/16 1139 03/22/16 1652  03/22/16 2130 03/22/16 2207  GLUCAP 139* 116* 219* 286* 307*   Lipid Profile: No results for input(s): CHOL, HDL, LDLCALC, TRIG, CHOLHDL, LDLDIRECT in the last 72 hours. Thyroid Function Tests:  Recent Labs  03/20/16 1644  TSH 1.481   Anemia Panel: No results for input(s): VITAMINB12, FOLATE, FERRITIN, TIBC, IRON, RETICCTPCT in the last 72 hours.  Sepsis Labs: @LABRCNTIP (procalcitonin:4,lacticidven:4)   )No results found for this or any previous visit (from the past 240 hour(s)).    Radiology Studies: Dg Chest 2 View 03/19/2016   Stable cardiomegaly. Pacemaker leads unchanged. No edema or consolidation. Electronically Signed   By:  Lowella Grip III M.D.   On: 03/19/2016 13:42   Ct Abdomen Pelvis W Contrast 03/20/2016  1. Equivocal urinary bladder wall thickening, may be due to degree of distension, however recommend correlation with urinalysis to exclude urinary tract infection. 2. Small to moderate volume intra-abdominal ascites. 3. Normal appendix. 4. Lack of renal excretion on delayed phase imaging, can be seen with underlying renal dysfunction. Recommend continued clinical follow-up. Electronically Signed   By: Jeb Levering M.D.   On: 03/20/2016 01:00     Scheduled Meds: . aspirin EC  81 mg Oral Daily  . atorvastatin  20 mg Oral q1800  . bisacodyl  10 mg Rectal Once  . digoxin  0.25 mg Oral Daily  . enoxaparin (LOVENOX) injection  40 mg Subcutaneous Q24H  . escitalopram  20 mg Oral Daily  . furosemide  80 mg Intravenous BID  . insulin aspart  0-5 Units Subcutaneous QHS  . insulin aspart  0-9 Units Subcutaneous TID WC  . insulin glargine  20 Units Subcutaneous QHS  . isosorbide mononitrate  30 mg Oral Daily  . losartan  25 mg Oral BID  . magnesium sulfate 1 - 4 g bolus IVPB  2 g Intravenous Once  . metoCLOPramide  10 mg Oral TID AC  . polyethylene glycol  17 g Oral Daily  . potassium chloride SA  20 mEq Oral BID  . senna  2 tablet Oral QHS  . spironolactone   25 mg Oral Daily   Continuous Infusions: . milrinone 0.25 mcg/kg/min (03/22/16 2000)     LOS: 4 days    Time spent: 25 minutes  Greater than 50% of the time spent on counseling and coordinating the care.   Leisa Lenz, MD [facility] Pager [phone]  If 7PM-7AM, please contact night-coverage [email or web address] Password [email or web address] 03/23/2016, 6:50 AM

## 2016-03-24 LAB — GLUCOSE, CAPILLARY
GLUCOSE-CAPILLARY: 120 mg/dL — AB (ref 65–99)
GLUCOSE-CAPILLARY: 116 mg/dL — AB (ref 65–99)
Glucose-Capillary: 134 mg/dL — ABNORMAL HIGH (ref 65–99)
Glucose-Capillary: 289 mg/dL — ABNORMAL HIGH (ref 65–99)

## 2016-03-24 LAB — BASIC METABOLIC PANEL
ANION GAP: 7 (ref 5–15)
BUN: 20 mg/dL (ref 6–20)
CALCIUM: 9.2 mg/dL (ref 8.9–10.3)
CO2: 34 mmol/L — AB (ref 22–32)
CREATININE: 0.94 mg/dL (ref 0.61–1.24)
Chloride: 94 mmol/L — ABNORMAL LOW (ref 101–111)
GFR calc Af Amer: >60 mL/min (ref >60)
GFR calc non Af Amer: >60 mL/min (ref >60)
GLUCOSE: 117 mg/dL — AB (ref 65–99)
Potassium: 3.8 mmol/L (ref 3.5–5.1)
Sodium: 135 mmol/L (ref 135–145)

## 2016-03-24 LAB — ABO/RH: ABO/RH(D): O POS

## 2016-03-24 LAB — TYPE AND SCREEN
ABO/RH(D): O POS
Antibody Screen: NEGATIVE

## 2016-03-24 LAB — URINE CULTURE

## 2016-03-24 LAB — CARBOXYHEMOGLOBIN
Carboxyhemoglobin: 1.5 % (ref 0.5–1.5)
Methemoglobin: 1 % (ref 0.0–1.5)
O2 Saturation: 61.6 %
TOTAL HEMOGLOBIN: 11.5 g/dL — AB (ref 13.5–18.0)

## 2016-03-24 LAB — CBC
HCT: 34.7 % — ABNORMAL LOW (ref 39.0–52.0)
HEMOGLOBIN: 10.9 g/dL — AB (ref 13.0–17.0)
MCH: 28.5 pg (ref 26.0–34.0)
MCHC: 31.4 g/dL (ref 30.0–36.0)
MCV: 90.6 fL (ref 78.0–100.0)
Platelets: 169 10*3/uL (ref 150–400)
RBC: 3.83 MIL/uL — ABNORMAL LOW (ref 4.22–5.81)
RDW: 16.3 % — AB (ref 11.5–15.5)
WBC: 7 10*3/uL (ref 4.0–10.5)

## 2016-03-24 MED ORDER — DOCUSATE SODIUM 100 MG PO CAPS
100.0000 mg | ORAL_CAPSULE | Freq: Two times a day (BID) | ORAL | Status: DC
Start: 2016-03-24 — End: 2016-03-25
  Administered 2016-03-24 – 2016-03-25 (×3): 100 mg via ORAL
  Filled 2016-03-24 (×3): qty 1

## 2016-03-24 MED ORDER — SODIUM CHLORIDE 0.9% FLUSH
10.0000 mL | INTRAVENOUS | Status: DC | PRN
Start: 2016-03-24 — End: 2016-03-25

## 2016-03-24 MED ORDER — TORSEMIDE 20 MG PO TABS
40.0000 mg | ORAL_TABLET | Freq: Every day | ORAL | Status: DC
  Administered 2016-03-24 – 2016-03-25 (×2): 40 mg via ORAL
  Filled 2016-03-24 (×2): qty 2

## 2016-03-24 MED ORDER — ISOSORBIDE MONONITRATE ER 30 MG PO TB24
30.0000 mg | ORAL_TABLET | Freq: Every day | ORAL | Status: DC
  Administered 2016-03-24 – 2016-03-25 (×2): 30 mg via ORAL
  Filled 2016-03-24 (×2): qty 1

## 2016-03-24 MED ORDER — LOSARTAN POTASSIUM 25 MG PO TABS
25.0000 mg | ORAL_TABLET | Freq: Two times a day (BID) | ORAL | Status: DC
  Administered 2016-03-24 – 2016-03-25 (×3): 25 mg via ORAL
  Filled 2016-03-24 (×3): qty 1

## 2016-03-24 MED ORDER — SODIUM CHLORIDE 0.9% FLUSH
10.0000 mL | Freq: Two times a day (BID) | INTRAVENOUS | Status: DC
  Administered 2016-03-24: 10 mL

## 2016-03-24 NOTE — Progress Notes (Signed)
   03/24/16 1000  Clinical Encounter Type  Visited With Patient  Visit Type Initial;Spiritual support;Psychological support;Social support  Referral From Palliative care team  Spiritual Encounters  Spiritual Needs Prayer;Emotional;Grief support  [facility] responding to request to meet with pt; pt was awake and observed to be in a very positive mood and indicated improvement from yesterday helped mood; Spiritual, comfort and emotional support provided. Gwynn Burly 10:07 AM

## 2016-03-24 NOTE — Progress Notes (Signed)
Peripherally Inserted Central Catheter/Midline Placement  The IV Nurse has discussed with the patient and/or persons authorized to consent for the patient, the purpose of this procedure and the potential benefits and risks involved with this procedure.  The benefits include less needle sticks, lab draws from the catheter and patient may be discharged home with the catheter.  Risks include, but not limited to, infection, bleeding, blood clot (thrombus formation), and puncture of an artery; nerve damage and irregular heat beat.  Alternatives to this procedure were also discussed.  PICC/Midline Placement Documentation        Jule Economy Horton 03/24/2016, 5:41 PM

## 2016-03-24 NOTE — Progress Notes (Signed)
Report called to Sesser, Therapist, sports. Elink notified of transfer. Pt ambulated to new room with belongings. VSS.

## 2016-03-24 NOTE — Progress Notes (Signed)
CARDIAC REHAB PHASE I   Attempted to ambulate with pt, however, pt still with swan catheter in place. Spoke with pt (was just seen by cardiac rehab last month), encouraged out of bed to chair once swan removed, will plan to ambulate with pt in am. Pt in bed, call bell within reach. Will follow-up tomorrow.   Lenna Sciara, RN, BSN 03/24/2016 2:14 PM

## 2016-03-24 NOTE — Progress Notes (Addendum)
Patient ID: Ronald Miller., male   DOB: 05-30-1956, 60 y.o.   MRN: [id number]  PROGRESS NOTE    Gunnar Fusi.  [id number] DOB: 17-Mar-1956 DOA: 03/19/2016  PCP: [facility]   Brief Narrative:   60 y.o. male with history of CAD s/p CABG in 2000, all grafts down except for LIMA to LAD, chronic systolic heart failure with EF of 15-20% with recurrent hospitalizations for heart failure exacerbation followed by Dr. Haroldine Laws, s/p Medtronic ICD 2014, essential hypertension, cocaine and tobacco abuse, depression, and COPD who presented with nausea, vomiting, abdominal, back and chest pain.  In the ER, BP was 86/65 and initially tachycardic with HR In 120's. His blood work was notable for sodium 125, creatinine at baseline of 1.19. Troponin was 0.06. The 12 lead EKG demonstrated sinus rhythm with occasional PVC, RBBB, Q-waves in inferior leads.T-waves and ST segments were similar to prior EKG. BNP was 1079. CXR showed stable cardiomegaly.  Cardiac cath done 6/19 with low cardiac output and biventricular failure. Started on milrinone drip per cardio and most likely will require this on discharge.    Assessment & Plan:  Chest pain / History of ICD placement / Troponin elevation  - Troponin level 0.05, 0.05 - Cardiac cath with low cardiac output due to biventricular failure - Cardio on board   Acute on chronic systolic HF with severe biventricular dysfunction EF 15% due to NICM. Has medtronic ICD. NYHA III. - Cardiac cath 6/19 - low cardiac output due to biventricular failure  - This am CVP down to 7 with good diuresis yesterday (3798 cc). - Pt will likely need home milrinone, continue at 0.25.  - Transition to torsemide 40 mg bid today  - Continue current digoxin, losartan, spironolactone.   Nausea, vomiting and abdominal pain - Possible from gastroparesis from history of diabetes - No acute findings on CT abdomen - LFTs, lipase, and UA were  unremarkable.  - Continue regaln 10 mg PO TID  Hypotension / Essential hypertension - BP low from heart failure. - Continue milrinone drip  - Cardio recommended changing lasix to toremide  CAD - Severe 3v- CAD s/p CABG with occluded grafts except for LIMA to LAD. No targets for revascularization. 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  - A1c 8.4 in 02/2016 - Currently on Lantus 20 units at bedtime along with novolog 5 units TID and SSI - CBG's in past 24 hours: 225, 154, 120  Dyslipidemia associated with type 2 DM - Continue atorvastatin 20 mg at bedtime   Morbid obesity due to excess calories - Body mass index is 35.63 kg/(m^2) with comorbidities hypertension, diabetes, dyslipidemia  - Counseled on diet, nutrition   Hyponatremia - Due to CHF etiology - Has subsequently normalized   Depression - Continue lexapro    DVT prophylaxis: Lovenox subQ  Code Status: full code  Family Communication: no family at the bedside this am  Disposition Plan: home once cleared by cardiology    Consultants:   Cardiology   Procedures:   Cardiac cath 03/22/2016 -  Low cardiac output due to biventricular failure   Antimicrobials:   None    Subjective: Feels better, slept good, no overnight events.   Objective: Filed Vitals:   03/24/16 0300 03/24/16 0400 03/24/16 0500 03/24/16 0600  BP: 106/64 95/62 92/70  103/67  Pulse: 86 81 81 80  Temp: 97.5  F (36.4 C) 97.3 F (36.3 C) 97.2 F (36.2 C) 97.2 F (36.2 C)  TempSrc: Core (Comment) Core (Comment) Core (Comment) Core (Comment)  Resp: 14 15 12 15   Height:   5\' 9"  (1.753 m)   Weight:   104.282 kg (229 lb 14.4 oz)   SpO2: 92% 93% 93% 93%    Intake/Output Summary (Last 24 hours) at 03/24/16 0725 Last data filed at 03/24/16 0600  Gross per 24 hour  Intake 2146.8 ml  Output   5945 ml  Net -3798.2 ml     Filed Weights   03/22/16 1048 03/23/16 0500 03/24/16 0500  Weight: 109.498 kg (241 lb 6.4 oz) 106.913 kg (235 lb 11.2 oz) 104.282 kg (229 lb 14.4 oz)    Examination:  General exam: calm and comfortable  Respiratory system: bilateral air entry, no wheezing  Cardiovascular system: S1 & S2 (+), rate controlled  Gastrointestinal system: (+) BS, obese, non tender abdomen  Central nervous system: Nonfocal  Extremities: LE edema, appreciate pulses  Skin: venous stasis changes LE bilaterally Psychiatry: No agitation, no restlessness   Data Reviewed: I have personally reviewed following labs and imaging studies  CBC:  Recent Labs Lab 03/21/16 0219 03/22/16 0213 03/22/16 1400 03/23/16 0345 03/24/16 0520  WBC 6.5 6.5 6.3 6.4 7.0  HGB 11.8* 11.0* 11.2* 10.9* 10.9*  HCT 35.8* 33.7* 34.7* 34.3* 34.7*  MCV 88.6 88.5 90.8 92.2 90.6  PLT 158 150 156 163 [id number]   Basic Metabolic Panel:  Recent Labs Lab 03/20/16 1554 03/21/16 0219 03/22/16 0213 03/22/16 1400 03/23/16 0345 03/24/16 0520  NA 124* 126* 128*  --  134* 135  K 4.3 4.0 4.2  --  4.0 3.8  CL 88* 90* 93*  --  96* 94*  CO2 28 27 28   --  32 34*  GLUCOSE 174* 139* 156*  --  178* 117*  BUN 36* 40* 47*  --  33* 20  CREATININE 1.24 1.33* 1.28* 1.33* 1.16 0.94  CALCIUM 8.5* 8.5* 8.3*  --  8.4* 9.2  MG  --   --   --   --  1.9  --    GFR: Estimated Creatinine Clearance: 100.7 mL/min (by C-G formula based on Cr of 0.94). Liver Function Tests:  Recent Labs Lab 03/19/16 1214  AST 34  ALT 28  ALKPHOS 106  BILITOT 1.0  PROT 7.0  ALBUMIN 3.7    Recent Labs Lab 03/19/16 1214  LIPASE 25   No results for input(s): AMMONIA in the last 168 hours. Coagulation Profile:  Recent Labs Lab 03/22/16 0213  INR 1.25   Cardiac Enzymes:  Recent Labs Lab 03/19/16 1856 03/20/16 0055 03/20/16 0646  TROPONINI 0.06* 0.05* 0.05*   BNP (last 3 results) No results for input(s): PROBNP in the last 8760 hours. HbA1C: No  results for input(s): HGBA1C in the last 72 hours. CBG:  Recent Labs Lab 03/22/16 2207 03/23/16 0752 03/23/16 1141 03/23/16 1628 03/23/16 2153  GLUCAP 307* 169* 194* 225* 154*   Lipid Profile: No results for input(s): CHOL, HDL, LDLCALC, TRIG, CHOLHDL, LDLDIRECT in the last 72 hours. Thyroid Function Tests: No results for input(s): TSH, T4TOTAL, FREET4, T3FREE, THYROIDAB in the last 72 hours. Anemia Panel: No results for input(s): VITAMINB12, FOLATE, FERRITIN, TIBC, IRON, RETICCTPCT in the last 72 hours.  Sepsis Labs: @LABRCNTIP (procalcitonin:4,lacticidven:4)   )No results found for this or any previous visit (from the past 240 hour(s)).    Radiology Studies: Dg Chest 2 View 03/19/2016   Stable cardiomegaly.  Pacemaker leads unchanged. No edema or consolidation. Electronically Signed   By: Lowella Grip III M.D.   On: 03/19/2016 13:42   Ct Abdomen Pelvis W Contrast 03/20/2016  1. Equivocal urinary bladder wall thickening, may be due to degree of distension, however recommend correlation with urinalysis to exclude urinary tract infection. 2. Small to moderate volume intra-abdominal ascites. 3. Normal appendix. 4. Lack of renal excretion on delayed phase imaging, can be seen with underlying renal dysfunction. Recommend continued clinical follow-up. Electronically Signed   By: Jeb Levering M.D.   On: 03/20/2016 01:00    . aspirin EC  81 mg Oral Daily  . atorvastatin  20 mg Oral q1800  . bisacodyl  10 mg Rectal Once  . digoxin  0.25 mg Oral Daily  . docusate sodium  100 mg Oral BID  . enoxaparin (LOVENOX) injection  40 mg Subcutaneous Q24H  . escitalopram  20 mg Oral Daily  . insulin aspart  0-5 Units Subcutaneous QHS  . insulin aspart  0-9 Units Subcutaneous TID WC  . insulin aspart  5 Units Subcutaneous TID WC  . insulin glargine  20 Units Subcutaneous QHS  . isosorbide mononitrate  30 mg Oral Daily  . losartan  25 mg Oral BID  . metoCLOPramide  10 mg Oral TID AC  .  polyethylene glycol  17 g Oral Daily  . potassium chloride SA  20 mEq Oral BID  . senna  2 tablet Oral QHS  . sodium chloride flush  10-40 mL Intracatheter Q12H  . sodium chloride flush  3 mL Intravenous Q12H  . sodium chloride flush  3 mL Intravenous Q12H  . sodium chloride flush  3 mL Intravenous Q12H  . spironolactone  25 mg Oral Daily  . torsemide  40 mg Oral Daily     Continuous Infusions: . milrinone 0.25 mcg/kg/min (03/23/16 2228)     LOS: 5 days    Time spent: 25 minutes  Greater than 50% of the time spent on counseling and coordinating the care.   Leisa Lenz, MD [facility] Pager [phone]  If 7PM-7AM, please contact night-coverage [email or web address] Password [email or web address] 03/24/2016, 7:25 AM

## 2016-03-24 NOTE — Progress Notes (Signed)
Advanced Heart Failure Rounding Note   Subjective:    Admitted with increased dyspnea. Post cath started on milrinone. Yesterday he continued to diurese with IV lasix + metolazone. Brisk diuresis noted. On milrinone 0.25 mcg.    Overall feeling better.    Todays CO-OX 62%.   [place] numbers CVP 7 PAP 47/18 (30)  PCWP 25 CO/CI 4.77/1.98 RA/PCWP 0.24   RHC 6/19 RA = 23 RV = 46/25 PA = 49/28 (36) PCW = 27 Fick cardiac output/index = 4.9/2.2 Thermo CO/Ci = 4.1/1.8 PVR = 2.2 WU Ao sat = 89% PA sat = 49%, 53% RA/PCWP = 0.85  Assessment:  1. Low cardiac output due to biventricular failure  Objective:   Weight Range:  Vital Signs:   Temp:  [96.3 F (35.7 C)-97.7 F (36.5 C)] 97.2 F (36.2 C) (06/21 0600) Pulse Rate:  [80-95] 80 (06/21 0600) Resp:  [11-20] 15 (06/21 0600) BP: (83-132)/(55-86) 103/67 mmHg (06/21 0600) SpO2:  [89 %-95 %] 93 % (06/21 0600) Weight:  [229 lb 14.4 oz (104.282 kg)] 229 lb 14.4 oz (104.282 kg) (06/21 0500) Last BM Date: 03/22/16  Weight change: Filed Weights   03/22/16 1048 03/23/16 0500 03/24/16 0500  Weight: 241 lb 6.4 oz (109.498 kg) 235 lb 11.2 oz (106.913 kg) 229 lb 14.4 oz (104.282 kg)    Intake/Output:   Intake/Output Summary (Last 24 hours) at 03/24/16 0714 Last data filed at 03/24/16 0600  Gross per 24 hour  Intake 2146.8 ml  Output   5945 ml  Net -3798.2 ml    PHYSICAL EXAM: CVP 7 General: lying flat in bed NAD HEENT: normal Neck: supple. JCarotids 2+ bilat; no bruits. No thyromegaly or nodule noted. RIJ swan.  Cor: PMI laterally displaced. RRR. No M/G/R Lungs: CTAB, normal effort Abdomen: Obese, NT, mildly distended, no HSM. No bruits or masses. +BS  Extremities: no cyanosis, clubbing. R and LLE Chronic venous stasis changes with healing wound on R shin . No edema.  Neuro: alert & oriented x 3, cranial nerves grossly intact. moves all 4 extremities w/o difficulty. Affect pleasant.  Telemetry: AV paced 90s    Labs: Basic Metabolic Panel:  Recent Labs Lab 03/20/16 1554 03/21/16 0219 03/22/16 0213 03/22/16 1400 03/23/16 0345 03/24/16 0520  NA 124* 126* 128*  --  134* 135  K 4.3 4.0 4.2  --  4.0 3.8  CL 88* 90* 93*  --  96* 94*  CO2 28 27 28   --  32 34*  GLUCOSE 174* 139* 156*  --  178* 117*  BUN 36* 40* 47*  --  33* 20  CREATININE 1.24 1.33* 1.28* 1.33* 1.16 0.94  CALCIUM 8.5* 8.5* 8.3*  --  8.4* 9.2  MG  --   --   --   --  1.9  --     Liver Function Tests:  Recent Labs Lab 03/19/16 1214  AST 34  ALT 28  ALKPHOS 106  BILITOT 1.0  PROT 7.0  ALBUMIN 3.7    Recent Labs Lab 03/19/16 1214  LIPASE 25   No results for input(s): AMMONIA in the last 168 hours.  CBC:  Recent Labs Lab 03/21/16 0219 03/22/16 0213 03/22/16 1400 03/23/16 0345 03/24/16 0520  WBC 6.5 6.5 6.3 6.4 7.0  HGB 11.8* 11.0* 11.2* 10.9* 10.9*  HCT 35.8* 33.7* 34.7* 34.3* 34.7*  MCV 88.6 88.5 90.8 92.2 90.6  PLT 158 150 156 163 169    Cardiac Enzymes:  Recent Labs Lab 03/19/16 1856 03/20/16 0055  03/20/16 0646  TROPONINI 0.06* 0.05* 0.05*    BNP: BNP (last 3 results)  Recent Labs  03/09/16 1440 03/10/16 0930 03/19/16 1230  BNP 1634.5* 1369.8* 1079.6*    ProBNP (last 3 results) No results for input(s): PROBNP in the last 8760 hours.    Other results:  Imaging: No results found.   Medications:     Scheduled Medications: . aspirin EC  81 mg Oral Daily  . atorvastatin  20 mg Oral q1800  . bisacodyl  10 mg Rectal Once  . digoxin  0.25 mg Oral Daily  . enoxaparin (LOVENOX) injection  40 mg Subcutaneous Q24H  . escitalopram  20 mg Oral Daily  . furosemide  80 mg Intravenous BID  . insulin aspart  0-5 Units Subcutaneous QHS  . insulin aspart  0-9 Units Subcutaneous TID WC  . insulin aspart  5 Units Subcutaneous TID WC  . insulin glargine  20 Units Subcutaneous QHS  . isosorbide mononitrate  30 mg Oral Daily  . losartan  25 mg Oral BID  . metoCLOPramide  10 mg Oral  TID AC  . metolazone  5 mg Oral BID  . polyethylene glycol  17 g Oral Daily  . potassium chloride SA  20 mEq Oral BID  . senna  2 tablet Oral QHS  . sodium chloride flush  10-40 mL Intracatheter Q12H  . sodium chloride flush  3 mL Intravenous Q12H  . sodium chloride flush  3 mL Intravenous Q12H  . sodium chloride flush  3 mL Intravenous Q12H  . spironolactone  25 mg Oral Daily    Infusions: . milrinone 0.25 mcg/kg/min (03/23/16 2228)    PRN Medications: sodium chloride, sodium chloride, acetaminophen, albuterol, baclofen, ondansetron (ZOFRAN) IV, promethazine, sodium chloride flush, sodium chloride flush, sodium chloride flush, traMADol, zolpidem   Assessment:   1. Cardiogenic shock 2. 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 Has medtronic ICD.  3. CAD --severe 3v- CAD s/p CABG with occluded grafts as above except for LIMA. No targets for revascularization. Troponin negative  3. HTN 4. DMII 5. Fatty liver with ascites 6. Ascites --CT shows mild to moderate ascites  Plan/Discussion:   Post cath started on milrinone. Low output with biventricular failure. Doubt candidate for advanced therapies give R sided failure.  Appears euvolemic. PA pressures coming down. CI still low. Will need home milrinone. Stop IV lasix and metolazone. Start torsemide 40 mg twice a day.  Todays CO-OX 62%. Continue milrinone 0.25 mcg + digoxin 0.25 mg daily. SBP soft. Continue losartan 25 mg daily. Renal function stable.   Remove swan. Place PICC.   Will need palliative care consult.  He remains at high risk for readmit due to poor insight.   Length of Stay: 5 Amy Clegg NP-C  03/24/2016, 7:14 AM  Advanced Heart Failure Team Pager [phone] (M-F; 7a - 4p)  Please contact [facility] for night-coverage after hours (4p -7a ) and weekends on [email or web address]  Patient seen with PA, agree with the above note.  Patient feeling better.  Good co-ox but  cardiac index remains marginal around 2.  CVP down to 7 with good diuresis yesterday.  RV function appears improved with milrinone based on CVP/PCWP.  - It appears that he will need home milrinone, continue at 0.25.  - Would not push diuresis aggressively at this point with CVP 7 in setting of RV dysfunction => transition to torsemide 40 mg bid.  - Continue current digoxin, losartan,  spironolactone.  No BP room to titrate losartan.  Leanna Sato, place PICC>  - May go to step-down, mobilize with cardiac rehab. - Suspect he will be able to go home soon with milrinone.   Long-term, appears to be a marginal candidate for LVAD with quite significant RV dysfunction on echo.   35 minutes critical care time.   Loralie Champagne 03/24/2016 7:54 AM

## 2016-03-25 DIAGNOSIS — Z7189 Other specified counseling: Secondary | ICD-10-CM

## 2016-03-25 DIAGNOSIS — Z515 Encounter for palliative care: Secondary | ICD-10-CM

## 2016-03-25 LAB — CBC
HEMATOCRIT: 34.2 % — AB (ref 39.0–52.0)
HEMOGLOBIN: 11 g/dL — AB (ref 13.0–17.0)
MCH: 28.8 pg (ref 26.0–34.0)
MCHC: 32.2 g/dL (ref 30.0–36.0)
MCV: 89.5 fL (ref 78.0–100.0)
Platelets: 164 10*3/uL (ref 150–400)
RBC: 3.82 MIL/uL — ABNORMAL LOW (ref 4.22–5.81)
RDW: 16 % — AB (ref 11.5–15.5)
WBC: 7.3 10*3/uL (ref 4.0–10.5)

## 2016-03-25 LAB — BASIC METABOLIC PANEL
ANION GAP: 7 (ref 5–15)
BUN: 17 mg/dL (ref 6–20)
CO2: 33 mmol/L — AB (ref 22–32)
Calcium: 8.9 mg/dL (ref 8.9–10.3)
Chloride: 91 mmol/L — ABNORMAL LOW (ref 101–111)
Creatinine, Ser: 0.91 mg/dL (ref 0.61–1.24)
GFR calc Af Amer: >60 mL/min (ref >60)
GFR calc non Af Amer: >60 mL/min (ref >60)
GLUCOSE: 161 mg/dL — AB (ref 65–99)
POTASSIUM: 4 mmol/L (ref 3.5–5.1)
Sodium: 131 mmol/L — ABNORMAL LOW (ref 135–145)

## 2016-03-25 LAB — GLUCOSE, CAPILLARY
GLUCOSE-CAPILLARY: 138 mg/dL — AB (ref 65–99)
GLUCOSE-CAPILLARY: 164 mg/dL — AB (ref 65–99)

## 2016-03-25 LAB — CARBOXYHEMOGLOBIN
Carboxyhemoglobin: 1.5 % (ref 0.5–1.5)
Methemoglobin: 0.7 % (ref 0.0–1.5)
O2 Saturation: 59.8 %
Total hemoglobin: 9.9 g/dL — ABNORMAL LOW (ref 13.5–18.0)

## 2016-03-25 MED ORDER — SODIUM CHLORIDE 0.9 % IV SOLN
INTRAVENOUS | Status: DC | PRN
  Administered 2016-03-24: 19:00:00 via INTRAVENOUS

## 2016-03-25 MED ORDER — METOCLOPRAMIDE HCL 10 MG PO TABS: 10.0000 mg | ORAL_TABLET | Freq: Three times a day (TID) | ORAL | Status: DC | PRN

## 2016-03-25 MED ORDER — SENNA 8.6 MG PO TABS: 2.0000 | ORAL_TABLET | Freq: Every day | ORAL | Status: DC

## 2016-03-25 MED ORDER — TORSEMIDE 20 MG PO TABS: 40.0000 mg | ORAL_TABLET | Freq: Every day | ORAL | Status: DC

## 2016-03-25 MED ORDER — MILRINONE LACTATE IN DEXTROSE 20-5 MG/100ML-% IV SOLN: 0.2500 ug/kg/min | INTRAVENOUS | Status: DC

## 2016-03-25 MED ORDER — INSULIN GLARGINE 100 UNIT/ML ~~LOC~~ SOLN: 20.0000 [IU] | Freq: Every day | SUBCUTANEOUS | Status: DC

## 2016-03-25 NOTE — Care Management Important Message (Signed)
Important Message  Patient Details  Name: Ronald Miller. MRN: [id number] Date of Birth: [birth year]   Medicare Important Message Given:  Yes    Lacretia Leigh, RN 03/25/2016, 10:31 AM

## 2016-03-25 NOTE — Progress Notes (Signed)
[facility]  Patient Status: New pt for [facility] this admission  [facility] is providing the following services: HHRN and Home [facility] pharmacy team for home Milrinone. [facility] will support in hospital teaching and will enroll patient into [facility] home heart failure program. [facility] willl connect [name] to [facility] infusion/pump later today for DC home this afternoon.  If patient discharges after hours, please call [phone].   [name] 03/25/2016, 9:38 AM

## 2016-03-25 NOTE — Consult Note (Signed)
Consultation Note Date: 03/25/2016   Patient Name: Ronald Miller.  DOB: 04-03-1956  MRN: [id number]  Age / Sex: 60 y.o., male  PCP: [facility] Referring Physician: Robbie Lis, MD  Reason for Consultation: Establishing goals of care and Psychosocial/spiritual support  HPI/Patient Profile: 60 y.o. male   admitted on 03/19/2016 with PMH  of CAD s/p CABG in [id number],  chronic systolic heart failure with EF of 15-20% with recurrent hospitalizations for heart failure exacerbation followed by Dr. Haroldine Laws, s/p Medtronic ICD 2014, essential hypertension, Hx of cocaine and tobacco abuse, Depression, and COPD.   He has been undergoing titration of his diuretics to goal weight He has been receiving intermittent paracenteses, but on last [place] a week ago, he did not have enough fluid for paracentesis. He was seen by the heart failure team on a weekly basis    ED Course: In the ER, he was mildly hypotensives to 86/65 and initially tachycardic to 120. He was given a 538mL bolus and his HR trended down to the 70s and his blood pressure rose to the 90s and low [id number] systolic. His labs were notable for sodium 125, creatinine at baseline of 1.19. Troponin was negative. ECG demonstrated sinus rhythm with occasional PVC, RBBB, Q-waves in inferior leads. T-waves and ST segments appear similar to prior. CXR appears to have some fluid in the fissue but does not appear grossly overloaded compared to previous CXR. BNP is down to 1079. He was given zofran and his nausea improved in the ER.  Hospitalized for stablalization, currently  for dc today with home Milrinone.   Clinical Assessment and Goals of Care:   This NP Wadie Lessen reviewed medical records, received report from team, assessed the patient and then meet at the patient's bedside  to discuss diagnosis,  prognosis and  GOC and importance of Advanced  directives  Concepts of Hospice and Palliative Care were discussed  The difference between a aggressive medical intervention path  and a palliative comfort care path for this patient at this time was had.  Values and goals of care important to patient and family were attempted to be elicited.   MOST form was introduced  Questions and concerns addressed.  Patient  encouraged to call with questions or concerns.  PMT will continue to support holistically.     SUMMARY OF RECOMMENDATIONS    Code Status/Advance Care Planning:  Limited code- do not intubate documented today   Encouraged patient to secure HPOA and document advanced directives  Palliative Prophylaxis:   Bowel Regimen, Frequent Pain Assessment and Oral Care  Additional Recommendations (Limitations, Scope, Preferences):  Patient is open to all offered and availble medcialintervetnions to prolong life  Psycho-social/Spiritual:   Desire for further Chaplaincy support:no  Additional Recommendations: Education on Hospice  Prognosis:   Dependant on outcomes and desire for life prolonging measures  Discharge Planning: Home with Home Health      Primary Diagnoses: Present on Admission:  . Nausea & vomiting . Hypotension . Chronic systolic  heart failure ([place]) . Ascites  I have reviewed the medical record, interviewed the patient and family, and examined the patient. The following aspects are pertinent.  Past Medical History  Diagnosis Date  . ASCVD (arteriosclerotic cardiovascular disease)   . Diabetes mellitus   . Hypertension   . History of cocaine abuse   . Tobacco abuse   . Depression   . Suicidal ideation   . Chronic systolic CHF (congestive heart failure) ([place])   . COPD (chronic obstructive pulmonary disease) ([place])   . Coronary artery disease   . AICD (automatic cardioverter/defibrillator) present   . Shortness of breath dyspnea    Social History   Social History  . Marital Status: Single     Spouse Name: N/A  . Number of Children: N/A  . Years of Education: N/A   Occupational History  . Retired   . Truck driver    Social History Main Topics  . Smoking status: Former Smoker -- 1.50 packs/day for 23 years    Types: Cigarettes    Quit date: 08/27/2015  . Smokeless tobacco: Never Used  . Alcohol Use: No  . Drug Use: No     Comment: Former  . Sexual Activity: Not Asked   Other Topics Concern  . None   Social History Narrative   Divorced with one child   No regular exercise   Family History  Problem Relation Age of Onset  . CAD Father   . Diabetes Father   . Heart failure Father    Scheduled Meds: . atorvastatin  20 mg Oral q1800  . bisacodyl  10 mg Rectal Once  . digoxin  0.25 mg Oral Daily  . docusate sodium  100 mg Oral BID  . enoxaparin (LOVENOX) injection  40 mg Subcutaneous Q24H  . escitalopram  20 mg Oral Daily  . insulin aspart  0-5 Units Subcutaneous QHS  . insulin aspart  0-9 Units Subcutaneous TID WC  . insulin aspart  5 Units Subcutaneous TID WC  . insulin glargine  20 Units Subcutaneous QHS  . isosorbide mononitrate  30 mg Oral Daily  . losartan  25 mg Oral BID  . polyethylene glycol  17 g Oral Daily  . potassium chloride SA  20 mEq Oral BID  . senna  2 tablet Oral QHS  . sodium chloride flush  10-40 mL Intracatheter Q12H  . sodium chloride flush  10-40 mL Intracatheter Q12H  . sodium chloride flush  3 mL Intravenous Q12H  . sodium chloride flush  3 mL Intravenous Q12H  . sodium chloride flush  3 mL Intravenous Q12H  . spironolactone  25 mg Oral Daily  . torsemide  40 mg Oral Daily   Continuous Infusions: . sodium chloride 2 mL/hr at 03/25/16 0700  . milrinone 0.25 mcg/kg/min (03/25/16 0527)   PRN Meds:.sodium chloride, albuterol, baclofen, metoCLOPramide, ondansetron (ZOFRAN) IV, promethazine, sodium chloride flush, sodium chloride flush, sodium chloride flush, sodium chloride flush, traMADol, zolpidem Medications Prior to Admission:    Prior to Admission medications   Medication Sig Start Date End Date Taking? Authorizing Provider  acetaminophen (TYLENOL) 500 MG tablet Take 1,000 mg by mouth every 6 (six) hours as needed for mild pain or headache.   Yes Historical Provider, MD  albuterol (PROVENTIL HFA;VENTOLIN HFA) 108 (90 BASE) MCG/ACT inhaler Inhale 2 puffs into the lungs every 6 (six) hours as needed for wheezing or shortness of breath.   Yes Historical Provider, MD  aspirin EC 81 MG tablet Take 81  mg by mouth daily.   Yes Historical Provider, MD  atorvastatin (LIPITOR) 20 MG tablet Take 1 tablet (20 mg total) by mouth every evening. 01/18/16  Yes Lytle Butte, MD  baclofen (LIORESAL) 10 MG tablet Take 10 mg by mouth 3 (three) times daily as needed for muscle spasms.   Yes Historical Provider, MD  digoxin (LANOXIN) 0.25 MG tablet Take 1 tablet (0.25 mg total) by mouth daily. 02/19/16  Yes Shaune Pascal Bensimhon, MD  escitalopram (LEXAPRO) 20 MG tablet Take 1 tablet (20 mg total) by mouth daily. 09/02/15  Yes Vipul Manuella Ghazi, MD  insulin aspart (NOVOLOG) 100 UNIT/ML injection Inject 0-15 Units into the skin 3 (three) times daily with meals. SLIDING SCALE   Yes Historical Provider, MD  insulin glargine (LANTUS) 100 UNIT/ML injection Inject 40 Units into the skin at bedtime.    Yes Historical Provider, MD  isosorbide mononitrate (IMDUR) 30 MG 24 hr tablet Take 1 tablet (30 mg total) by mouth daily. 03/17/16  Yes [name] Friar, PA-C  losartan (COZAAR) 25 MG tablet Take 1 tablet (25 mg total) by mouth 2 (two) times daily. 02/19/16  Yes Jolaine Artist, MD  metolazone (ZAROXOLYN) 5 MG tablet Take 1 tablet (5 mg total) by mouth daily as needed (weight greater than 229). Reported on 02/19/2016 03/09/16  Yes Amy D Clegg, NP  potassium chloride SA (K-DUR,KLOR-CON) 20 MEQ tablet Take 1 tablet (20 mEq total) by mouth 2 (two) times daily. 02/19/16  Yes Jolaine Artist, MD  spironolactone (ALDACTONE) 25 MG tablet Take 1 tablet (25 mg total)  by mouth daily. 03/10/16  Yes Amy D Clegg, NP  torsemide (DEMADEX) 20 MG tablet Take 2 tablets (40 mg total) by mouth 2 (two) times daily. 03/09/16  Yes Amy D Clegg, NP  zolpidem (AMBIEN) 5 MG tablet Take 1 tablet (5 mg total) by mouth at bedtime as needed for sleep. 01/18/16  Yes Lytle Butte, MD  insulin glargine (LANTUS) 100 UNIT/ML injection Inject 0.2 mLs (20 Units total) into the skin at bedtime. 03/25/16   Robbie Lis, MD  milrinone (PRIMACOR) 20 MG/100 ML SOLN infusion Inject 27.35 mcg/min into the vein continuous. 03/25/16   Robbie Lis, MD  senna (SENOKOT) 8.6 MG TABS tablet Take 2 tablets (17.2 mg total) by mouth at bedtime. 03/25/16   Robbie Lis, MD  torsemide (DEMADEX) 20 MG tablet Take 2 tablets (40 mg total) by mouth daily. 03/25/16   Robbie Lis, MD   Allergies  Allergen Reactions  . Codeine Nausea And Vomiting   Review of Systems  Constitutional: Positive for fatigue.  Respiratory: Positive for shortness of breath.   Neurological: Positive for weakness.    Physical Exam  Constitutional: He appears well-developed. He is cooperative. He appears ill.  HENT:  Mouth/Throat: Oropharynx is clear and moist.  Neurological: He is alert.  Skin: Skin is warm and dry.    Vital Signs: BP 106/94 mmHg  Pulse 91  Temp(Src) 98 F (36.7 C) (Oral)  Resp 17  Ht 5\' 9"  (1.753 m)  Wt 103.4 kg (227 lb 15.3 oz)  BMI 33.65 kg/m2  SpO2 96% Pain Assessment: No/denies pain POSS *See Group Information*: 1-Acceptable,Awake and alert Pain Score: 0-No pain   SpO2: SpO2: 96 % O2 Device:SpO2: 96 % O2 Flow Rate: .   IO: Intake/output summary:  Intake/Output Summary (Last 24 hours) at 03/25/16 1240 Last data filed at 03/25/16 1000  Gross per 24 hour  Intake  761.4 ml  Output   3175 ml  Net -2413.6 ml    LBM: Last BM Date: 03/24/16 Baseline Weight: Weight: 104.781 kg (231 lb) Most recent weight: Weight: 103.4 kg (227 lb 15.3 oz)     Palliative Assessment/Data:  60%     Time  In: 1300 Time Out: 1400 Time Total: 60 min Greater than 50%  of this time was spent counseling and coordinating care related to the above assessment and plan.  Signed by: Wadie Lessen, NP   Please contact Palliative Medicine Team phone at [phone] for questions and concerns.  For individual provider: See Shea Evans

## 2016-03-25 NOTE — Progress Notes (Signed)
Advanced Heart Failure Rounding Note   Subjective:    Admitted with increased dyspnea. Post cath started on milrinone. Yesterday IV lasix + metolazone stopped and transitioned to torsemide. Weight down 14 pounds.   Overall feeling good. Denies SOB.    Today's CO-OX 60%.   RHC 6/19 RA = 23 RV = 46/25 PA = 49/28 (36) PCW = 27 Fick cardiac output/index = 4.9/2.2 Thermo CO/Ci = 4.1/1.8 PVR = 2.2 WU Ao sat = 89% PA sat = 49%, 53% RA/PCWP = 0.85  Assessment:  1. Low cardiac output due to biventricular failure  Objective:   Weight Range:  Vital Signs:   Temp:  [96.8 F (36 C)-98.2 F (36.8 C)] 97.9 F (36.6 C) (06/22 0744) Pulse Rate:  [84-92] 92 (06/22 0744) Resp:  [12-22] 22 (06/22 0744) BP: (87-108)/(60-75) 90/70 mmHg (06/22 0744) SpO2:  [94 %-100 %] 97 % (06/22 0744) Weight:  [227 lb 15.3 oz (103.4 kg)] 227 lb 15.3 oz (103.4 kg) (06/22 0335) Last BM Date: 03/24/16  Weight change: Filed Weights   03/23/16 0500 03/24/16 0500 03/25/16 0335  Weight: 235 lb 11.2 oz (106.913 kg) 229 lb 14.4 oz (104.282 kg) 227 lb 15.3 oz (103.4 kg)    Intake/Output:   Intake/Output Summary (Last 24 hours) at 03/25/16 0809 Last data filed at 03/25/16 0600  Gross per 24 hour  Intake 1010.4 ml  Output   3175 ml  Net -2164.6 ml    PHYSICAL EXAM: CVP 7-8 General: lying flat in bed NAD HEENT: normal Neck: supple. JCarotids 2+ bilat; no bruits. No thyromegaly or nodule noted. RIJ swan.  Cor: PMI laterally displaced. RRR. No M/G/R Lungs: CTAB, normal effort Abdomen: Obese, NT, mildly distended, no HSM. No bruits or masses. +BS  Extremities: no cyanosis, clubbing. R and LLE Chronic venous stasis changes with healing wound on R shin . No edema.  Neuro: alert & oriented x 3, cranial nerves grossly intact. moves all 4 extremities w/o difficulty. Affect pleasant.  Telemetry: AV paced 90s  Labs: Basic Metabolic Panel:  Recent Labs Lab 03/21/16 0219 03/22/16 0213  03/22/16 1400 03/23/16 0345 03/24/16 0520 03/25/16 0530  NA 126* 128*  --  134* 135 131*  K 4.0 4.2  --  4.0 3.8 4.0  CL 90* 93*  --  96* 94* 91*  CO2 27 28  --  32 34* 33*  GLUCOSE 139* 156*  --  178* 117* 161*  BUN 40* 47*  --  33* 20 17  CREATININE 1.33* 1.28* 1.33* 1.16 0.94 0.91  CALCIUM 8.5* 8.3*  --  8.4* 9.2 8.9  MG  --   --   --  1.9  --   --     Liver Function Tests:  Recent Labs Lab 03/19/16 1214  AST 34  ALT 28  ALKPHOS 106  BILITOT 1.0  PROT 7.0  ALBUMIN 3.7    Recent Labs Lab 03/19/16 1214  LIPASE 25   No results for input(s): AMMONIA in the last 168 hours.  CBC:  Recent Labs Lab 03/22/16 0213 03/22/16 1400 03/23/16 0345 03/24/16 0520 03/25/16 0530  WBC 6.5 6.3 6.4 7.0 7.3  HGB 11.0* 11.2* 10.9* 10.9* 11.0*  HCT 33.7* 34.7* 34.3* 34.7* 34.2*  MCV 88.5 90.8 92.2 90.6 89.5  PLT 150 156 163 169 164    Cardiac Enzymes:  Recent Labs Lab 03/19/16 1856 03/20/16 0055 03/20/16 0646  TROPONINI 0.06* 0.05* 0.05*    BNP: BNP (last 3 results)  Recent Labs  03/09/16 1440 03/10/16 0930 03/19/16 1230  BNP 1634.5* 1369.8* 1079.6*    ProBNP (last 3 results) No results for input(s): PROBNP in the last 8760 hours.    Other results:  Imaging: No results found.   Medications:     Scheduled Medications: . atorvastatin  20 mg Oral q1800  . bisacodyl  10 mg Rectal Once  . digoxin  0.25 mg Oral Daily  . docusate sodium  100 mg Oral BID  . enoxaparin (LOVENOX) injection  40 mg Subcutaneous Q24H  . escitalopram  20 mg Oral Daily  . insulin aspart  0-5 Units Subcutaneous QHS  . insulin aspart  0-9 Units Subcutaneous TID WC  . insulin aspart  5 Units Subcutaneous TID WC  . insulin glargine  20 Units Subcutaneous QHS  . isosorbide mononitrate  30 mg Oral Daily  . losartan  25 mg Oral BID  . polyethylene glycol  17 g Oral Daily  . potassium chloride SA  20 mEq Oral BID  . senna  2 tablet Oral QHS  . sodium chloride flush  10-40 mL  Intracatheter Q12H  . sodium chloride flush  10-40 mL Intracatheter Q12H  . sodium chloride flush  3 mL Intravenous Q12H  . sodium chloride flush  3 mL Intravenous Q12H  . sodium chloride flush  3 mL Intravenous Q12H  . spironolactone  25 mg Oral Daily  . torsemide  40 mg Oral Daily    Infusions: . sodium chloride 5 mL/hr at 03/24/16 1900  . milrinone 0.25 mcg/kg/min (03/25/16 0527)    PRN Medications: sodium chloride, albuterol, baclofen, metoCLOPramide, ondansetron (ZOFRAN) IV, promethazine, sodium chloride flush, sodium chloride flush, sodium chloride flush, sodium chloride flush, traMADol, zolpidem   Assessment:   1. Cardiogenic shock 2. 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 Has medtronic ICD.  3. CAD --severe 3v- CAD s/p CABG with occluded grafts as above except for LIMA. No targets for revascularization. Troponin negative  3. HTN 4. DMII 5. Fatty liver with ascites 6. Ascites --CT shows mild to moderate ascites  Plan/Discussion:    Post cath started on milrinone. Low output with biventricular failure. Doubt candidate for advanced therapies give right-sided failure.  Appears euvolemic. Will need home milrinone.  Continue torsemide 40 mg twice a day.  Todays CO-OX 60%, CVP 7-8. Continue milrinone 0.25 mcg + digoxin 0.25 mg daily. SBP soft. Continue losartan 25 mg daily. Renal function stable. Consult case management for [facility].     Will need palliative care consult.  He remains at high risk for readmit due to poor insight.   Home today if home milrinone can be arranged.   Home Meds Milrinone 0.25 mcg Torsemide 40 mg twice a day Dig 0.25 mg dialy  Spiro 25 mg daily. Losartan 25 mg twice a day  HF follow up July 3rd   Length of Stay: [place] NP-C  03/25/2016, 8:09 AM  Advanced Heart Failure Team Pager [phone] (M-F; 7a - 4p)  Please contact [facility] for night-coverage after hours (4p -7a ) and  weekends on [email or web address]  Patient seen with NP, agree with the above note.  Stable today, walking in hall comfortably.  Good co-ox on current milrinone and CVP optimized.  He is going to go home on milrinone 0.25 mcg/kg/min.  See the above discharge cardiac med regimen.   He will need close followup in CHF clinic, this has been arranged.    Will be high risk for  re-admit.  Will need LVAD consideration given low output but RV failure remains a limiting issue.   Loralie Champagne 03/25/2016 8:42 AM

## 2016-03-25 NOTE — Care Management Note (Signed)
Case Management Note  Patient Details  Name: Ronald Miller. MRN: [id number] Date of Birth: March 11, 1956  Subjective/Objective:     Adm w heart failure               Action/Plan: lives at home, act w [facility], pcp is [facility]   Expected Discharge Date:                  Expected Discharge Plan:  [place]  In-House Referral:     Discharge planning Services  CM Consult  Post Acute Care Choice:  Durable Medical Equipment, Resumption of Svcs/PTA Provider Choice offered to:  Patient  DME Arranged:  IV pump/equipment DME Agency:  [place]:  RN, Disease Management [place] Agency:  [facility]  Status of Service:  Completed, signed off  If discussed at [place] of Stay Meetings, dates discussed:    Additional Comments: spoke w Rhett Bannister at [facility] and she will contact pam chandler w [facility] for iv milrinone drip and [facility] nurses will change cartridge and follow pt for nsg after disch and [facility] will provide iv meds.  Lacretia Leigh, RN 03/25/2016, 10:16 AM

## 2016-03-25 NOTE — Progress Notes (Addendum)
Patient ID: Ronald Miller., male   DOB: [birth year], 60 y.o.   MRN: [id number]  PROGRESS NOTE    Gunnar Fusi.  [id number] DOB: July 13, 1956 DOA: 03/19/2016  PCP: [facility]   Brief Narrative:   60 y.o. male with history of CAD s/p CABG in 2000, all grafts down except for LIMA to LAD, chronic systolic heart failure with EF of 15-20% with recurrent hospitalizations for heart failure exacerbation followed by Dr. Haroldine Laws, s/p Medtronic ICD 2014, essential hypertension, cocaine and tobacco abuse, depression, and COPD who presented with nausea, vomiting, abdominal, back and chest pain.  In the ER, BP was 86/65 and initially tachycardic with HR In 120's. His blood work was notable for sodium 125, creatinine at baseline of 1.19. Troponin was 0.06. The 12 lead EKG demonstrated sinus rhythm with occasional PVC, RBBB, Q-waves in inferior leads.T-waves and ST segments were similar to prior EKG. BNP was 1079. CXR showed stable cardiomegaly.  Cardiac cath done 6/19 with low cardiac output and biventricular failure. Started on milrinone drip per cardio and most likely will require this on discharge.    Assessment & Plan:  Chest pain / History of ICD placement / Troponin elevation  - Troponin level 0.05, 0.05 - Cardiac cath with low cardiac output due to biventricular failure - Continue aspirin, statin   Acute on chronic systolic HF with severe biventricular dysfunction EF 15% due to NICM. Has medtronic ICD. NYHA III. - Cardiac cath 6/19 demonstrated low cardiac output due to biventricular failure  - Pt will continue milrinone drip on discharge - Midline placement 6/21 - Continue torsemide 40 mg bid today  - Continue current digoxin, losartan, spironolactone.   Nausea, vomiting and abdominal pain - Possible from gastroparesis from history of diabetes - No acute findings on CT abdomen - LFTs, lipase, and UA were unremarkable.  - Change reglan to as needed  regimen instead of scheduled since N/V resolved   Hypotension / Essential hypertension - BP low from heart failure. - Continue milrinone drip  - Cardio recommended changing lasix to toremide  CAD - Severe 3v- CAD s/p CABG with occluded grafts except for LIMA to LAD. No targets for revascularization. 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  - A1c 8.4 in 02/2016 - Currently on Lantus 20 units at bedtime along with novolog 5 units TID and SSI - CBG's in past 24 hours:116, 134, 289  Dyslipidemia associated with type 2 DM - Continue atorvastatin 20 mg at bedtime   Morbid obesity due to excess calories - Body mass index is 35.63 kg/(m^2) with comorbidities hypertension, diabetes, dyslipidemia  - Counseled on diet, nutrition   Hyponatremia - Due to CHF etiology - Sodium 131 this am, overall stable in past 48 hours   Depression - Continue lexapro  - Pt stable, does not appear to be depressed   DVT prophylaxis: Lovenox subQ  Code Status: full code  Family Communication: no family at the bedside this am  Disposition Plan: home once cleared by cardiology    Consultants:   Cardiology   Procedures:   Cardiac cath 03/22/2016 -  Low cardiac output due to biventricular failure   Antimicrobials:   None    Subjective: No overnight events.   Objective: Filed Vitals:   03/24/16 1627 03/24/16 1937 03/24/16 2332 03/25/16  0335  BP: 95/60 102/67 103/71 97/69  Pulse: 84     Temp: 97.5 F (36.4 C) 97.8 F (36.6 C) 98.2 F (36.8 C) 98 F (36.7 C)  TempSrc: Oral Oral Oral Oral  Resp: 17 17 21 18   Height:      Weight:    103.4 kg (227 lb 15.3 oz)  SpO2: 94% 96% 96% 95%    Intake/Output Summary (Last 24 hours) at 03/25/16 0630 Last data filed at 03/25/16 0600  Gross per 24 hour  Intake 1036.8 ml  Output    3175 ml  Net -2138.2 ml   Filed Weights   03/23/16 0500 03/24/16 0500 03/25/16 0335  Weight: 106.913 kg (235 lb 11.2 oz) 104.282 kg (229 lb 14.4 oz) 103.4 kg (227 lb 15.3 oz)    Examination:  General exam: no acute distress  Respiratory system: no wheezing, no rhonchi  Cardiovascular system: S1 & S2 (+), RRR Gastrointestinal system: non tender abdomen, (+) BS Central nervous system: No focal deficits  Extremities: LE edema much better, appreciate bilateral pulses  Skin: venous stasis changes LE bilaterally Psychiatry: Normal mood and behavior   Data Reviewed: I have personally reviewed following labs and imaging studies  CBC:  Recent Labs Lab 03/22/16 0213 03/22/16 1400 03/23/16 0345 03/24/16 0520 03/25/16 0530  WBC 6.5 6.3 6.4 7.0 7.3  HGB 11.0* 11.2* 10.9* 10.9* 11.0*  HCT 33.7* 34.7* 34.3* 34.7* 34.2*  MCV 88.5 90.8 92.2 90.6 89.5  PLT 150 156 163 169 [id number]   Basic Metabolic Panel:  Recent Labs Lab 03/21/16 0219 03/22/16 0213 03/22/16 1400 03/23/16 0345 03/24/16 0520 03/25/16 0530  NA 126* 128*  --  134* 135 131*  K 4.0 4.2  --  4.0 3.8 4.0  CL 90* 93*  --  96* 94* 91*  CO2 27 28  --  32 34* 33*  GLUCOSE 139* 156*  --  178* 117* 161*  BUN 40* 47*  --  33* 20 17  CREATININE 1.33* 1.28* 1.33* 1.16 0.94 0.91  CALCIUM 8.5* 8.3*  --  8.4* 9.2 8.9  MG  --   --   --  1.9  --   --    GFR: Estimated Creatinine Clearance: 103.6 mL/min (by C-G formula based on Cr of 0.91). Liver Function Tests:  Recent Labs Lab 03/19/16 1214  AST 34  ALT 28  ALKPHOS 106  BILITOT 1.0  PROT 7.0  ALBUMIN 3.7    Recent Labs Lab 03/19/16 1214  LIPASE 25   No results for input(s): AMMONIA in the last 168 hours. Coagulation Profile:  Recent Labs Lab 03/22/16 0213  INR 1.25   Cardiac Enzymes:  Recent Labs Lab 03/19/16 1856 03/20/16 0055 03/20/16 0646  TROPONINI 0.06* 0.05* 0.05*   BNP (last 3 results) No results for input(s): PROBNP in the last 8760  hours. HbA1C: No results for input(s): HGBA1C in the last 72 hours. CBG:  Recent Labs Lab 03/23/16 2153 03/24/16 0743 03/24/16 1114 03/24/16 1627 03/24/16 2132  GLUCAP 154* 120* 116* 134* 289*   Lipid Profile: No results for input(s): CHOL, HDL, LDLCALC, TRIG, CHOLHDL, LDLDIRECT in the last 72 hours. Thyroid Function Tests: No results for input(s): TSH, T4TOTAL, FREET4, T3FREE, THYROIDAB in the last 72 hours. Anemia Panel: No results for input(s): VITAMINB12, FOLATE, FERRITIN, TIBC, IRON, RETICCTPCT in the last 72 hours.  Sepsis Labs: @LABRCNTIP (procalcitonin:4,lacticidven:4)   Recent Results (from the past 240 hour(s))  Urine culture     Status: Abnormal   Collection Time:  03/23/16  5:12 PM  Result Value Ref Range Status   Specimen Description URINE, CLEAN CATCH  Final   Special Requests NONE  Final   Culture MULTIPLE SPECIES PRESENT, SUGGEST RECOLLECTION (A)  Final   Report Status 03/24/2016 FINAL  Final      Radiology Studies: Dg Chest 2 View 03/19/2016   Stable cardiomegaly. Pacemaker leads unchanged. No edema or consolidation. Electronically Signed   By: Lowella Grip III M.D.   On: 03/19/2016 13:42   Ct Abdomen Pelvis W Contrast 03/20/2016  1. Equivocal urinary bladder wall thickening, may be due to degree of distension, however recommend correlation with urinalysis to exclude urinary tract infection. 2. Small to moderate volume intra-abdominal ascites. 3. Normal appendix. 4. Lack of renal excretion on delayed phase imaging, can be seen with underlying renal dysfunction. Recommend continued clinical follow-up. Electronically Signed   By: Jeb Levering M.D.   On: 03/20/2016 01:00    . atorvastatin  20 mg Oral q1800  . bisacodyl  10 mg Rectal Once  . digoxin  0.25 mg Oral Daily  . docusate sodium  100 mg Oral BID  . enoxaparin (LOVENOX) injection  40 mg Subcutaneous Q24H  . escitalopram  20 mg Oral Daily  . insulin aspart  0-5 Units Subcutaneous QHS  .  insulin aspart  0-9 Units Subcutaneous TID WC  . insulin aspart  5 Units Subcutaneous TID WC  . insulin glargine  20 Units Subcutaneous QHS  . isosorbide mononitrate  30 mg Oral Daily  . losartan  25 mg Oral BID  . metoCLOPramide  10 mg Oral TID AC  . polyethylene glycol  17 g Oral Daily  . potassium chloride SA  20 mEq Oral BID  . senna  2 tablet Oral QHS  . spironolactone  25 mg Oral Daily  . torsemide  40 mg Oral Daily     Continuous Infusions: . sodium chloride 5 mL/hr at 03/24/16 1900  . milrinone 0.25 mcg/kg/min (03/25/16 0527)     LOS: 6 days    Time spent: 15 minutes  Greater than 50% of the time spent on counseling and coordinating the care.   Leisa Lenz, MD [facility] Pager [phone]  If 7PM-7AM, please contact night-coverage [email or web address] Password [email or web address] 03/25/2016, 6:30 AM

## 2016-03-25 NOTE — Progress Notes (Signed)
   03/25/16 1400  Clinical Encounter Type  Visited With Patient  Visit Type Other (Comment)  Referral From Nurse  Consult/Referral To Chaplain  Spiritual Encounters  Spiritual Needs Prayer  Stress Factors  Patient Stress Factors Loss of control  CHP responded to [facility] for EOL. Spoke with nurse who said he was being discharged. Death was not imminent.  CHP visited with patient who mentioned he had paperwork at home for DNR that he was going to get notarized. CHP prayed with patient. Roe Coombs 03/25/2016

## 2016-03-25 NOTE — Discharge Instructions (Signed)
Heart Failure  Heart failure means your heart has trouble pumping blood. This makes it hard for your body to work well. Heart failure is usually a long-term (chronic) condition. You must take good care of yourself and follow your doctor's treatment plan.  HOME CARE   Take your heart medicine as told by your doctor.    Do not stop taking medicine unless your doctor tells you to.    Do not skip any dose of medicine.    Refill your medicines before they run out.    Take other medicines only as told by your doctor or pharmacist.   Stay active if told by your doctor. The elderly and people with severe heart failure should talk with a doctor about physical activity.   Eat heart-healthy foods. Choose foods that are without trans fat and are low in saturated fat, cholesterol, and salt (sodium). This includes fresh or frozen fruits and vegetables, fish, lean meats, fat-free or low-fat dairy foods, whole grains, and high-fiber foods. Lentils and dried peas and beans (legumes) are also good choices.   Limit salt if told by your doctor.   Cook in a healthy way. Roast, grill, broil, bake, poach, steam, or stir-fry foods.   Limit fluids as told by your doctor.   Weigh yourself every morning. Do this after you pee (urinate) and before you eat breakfast. Write down your weight to give to your doctor.   Take your blood pressure and write it down if your doctor tells you to.   Ask your doctor how to check your pulse. Check your pulse as told.   Lose weight if told by your doctor.   Stop smoking or chewing tobacco. Do not use gum or patches that help you quit without your doctor's approval.   Schedule and go to doctor visits as told.   Nonpregnant women should have no more than 1 drink a day. Men should have no more than 2 drinks a day. Talk to your doctor about drinking alcohol.   Stop illegal drug use.   Stay current with shots (immunizations).   Manage your health conditions as told by your doctor.   Learn to  manage your stress.   Rest when you are tired.   If it is really hot outside:    Avoid intense activities.    Use air conditioning or fans, or get in a cooler place.    Avoid caffeine and alcohol.    Wear loose-fitting, lightweight, and light-colored clothing.   If it is really cold outside:    Avoid intense activities.    Layer your clothing.    Wear mittens or gloves, a hat, and a scarf when going outside.    Avoid alcohol.   Learn about heart failure and get support as needed.   Get help to maintain or improve your quality of life and your ability to care for yourself as needed.  GET HELP IF:    You gain weight quickly.   You are more short of breath than usual.   You cannot do your normal activities.   You tire easily.   You cough more than normal, especially with activity.   You have any or more puffiness (swelling) in areas such as your hands, feet, ankles, or belly (abdomen).   You cannot sleep because it is hard to breathe.   You feel like your heart is beating fast (palpitations).   You get dizzy or light-headed when you stand up.  GET HELP   are not doing well or get worse.   This information is not intended to replace advice given to you by your health care provider. Make sure you discuss any questions you have with your health care provider.   Document Released: 06/29/2008 Document Revised: 10/11/2014 Document Reviewed: 11/06/2012 Elsevier Interactive Patient Education 2016 [employer]. Milrinone injection What is this medicine? MILRINONE (MILL rih none) is an inotrope and vasodilator. It increases the strength of the heart muscle and widens blood vessels. It is used to treat  congestive heart failure. This medicine may be used for other purposes; ask your health care provider or pharmacist if you have questions. What should I tell my health care provider before I take this medicine? They need to know if you have any of these conditions: -heart disease -history of irregular heartbeat -kidney disease -recent heart attack -unusual or allergic reaction to milrinone, other medicines, foods, dyes, or preservatives -pregnant or trying to get pregnant -breast-feeding How should I use this medicine? This medicine is for infusion into a vein. It is given by a health care professional in a hospital or clinic setting. Talk to your pediatrician regarding the use of this medicine in children. While this drug may be prescribed for selected conditions, precautions do apply. Overdosage: If you think you have taken too much of this medicine contact a poison control center or emergency room at once. NOTE: This medicine is only for you. Do not share this medicine with others. What if I miss a dose? Keep appointments for follow-up doses as directed. It is important not to miss your dose. Call your doctor or health care professional if you are unable to keep an appointment. What may interact with this medicine? This medication may interact with the following medications: -anagrelide -diuretics -medicines for blood pressure This list may not describe all possible interactions. Give your health care provider a list of all the medicines, herbs, non-prescription drugs, or dietary supplements you use. Also tell them if you smoke, drink alcohol, or use illegal drugs. Some items may interact with your medicine. What should I watch for while using this medicine? Your condition will be monitored carefully while you are receiving this medicine. What side effects may I notice from receiving this medicine? Side effects that you should report to your doctor or health care professional as soon as  possible: -allergic reactions like skin rash, itching or hives, swelling of the face, lips, or tongue -pain, redness, or irritation at site where injected -signs and symptoms of a dangerous change in heartbeat or heart rhythm like chest pain; dizziness; fast or irregular heartbeat; palpitations; feeling faint or lightheaded, falls; breathing problems -signs and symptoms of low blood pressure like dizziness; feeling faint or lightheaded, falls; unusually weak or tired Side effects that usually do not require medical attention (report these to your doctor or health care professional if they continue or are bothersome): -headache -nausea, vomiting This list may not describe all possible side effects. Call your doctor for medical advice about side effects. You may report side effects to FDA at 1-800-FDA-1088. Where should I keep my medicine? This drug is given in a hospital or clinic and will not be stored at home. NOTE: This sheet is a summary. It may not cover all possible information. If you have questions about this medicine, talk to your doctor, pharmacist, or health care provider.    2016, Elsevier/Gold Standard. (2013-09-21 11:34:52)

## 2016-03-25 NOTE — Discharge Summary (Signed)
Physician Discharge Summary  Gunnar Fusi. [id number] DOB: 19-Nov-1955 DOA: 03/19/2016  PCP: [facility] date: 03/19/2016 Discharge date: 03/25/2016  Recommendations for Outpatient Follow-up:  Home health orders in place Pt needs milrinone drip at 0.25 mcg/kg/min PICC line per home care protocol  Discharge Diagnoses:  Active Problems:   Hypotension   Diabetes mellitus (HCC)   Chronic systolic heart failure (HCC)   Ascites   Nausea & vomiting   Acute right lower quadrant pain   Nausea and vomiting   Atypical chest pain   Hyponatremia   Emesis   Pain in the chest   Dyslipidemia associated with type 2 diabetes mellitus (HCC)   Cardiogenic shock (HCC)   Acute on chronic systolic (congestive) heart failure ([place])    Discharge Condition: stable   Diet recommendation: as tolerated   History of present illness:  60 y.o. male with history of CAD s/p CABG in 2000, all grafts down except for LIMA to LAD, chronic systolic heart failure with EF of 15-20% with recurrent hospitalizations for heart failure exacerbation followed by Dr. Haroldine Laws, s/p Medtronic ICD 2014, essential hypertension, cocaine and tobacco abuse, depression, and COPD who presented with nausea, vomiting, abdominal, back and chest pain.  In the ER, BP was 86/65 and initially tachycardic with HR In 120's. His blood work was notable for sodium 125, creatinine at baseline of 1.19. Troponin was 0.06. The 12 lead EKG demonstrated sinus rhythm with occasional PVC, RBBB, Q-waves in inferior leads.T-waves and ST segments were similar to prior EKG. BNP was 1079. CXR showed stable cardiomegaly.  Cardiac cath done 6/19 with low cardiac output and biventricular failure. Started on milrinone drip per cardio.   Hospital Course:   Assessment & Plan:  Chest pain / History of ICD placement / Troponin elevation  - Troponin level 0.05, 0.05 - Cardiac cath with low cardiac output due to  biventricular failure - Continue aspirin, statin   Acute on chronic systolic HF with severe biventricular dysfunction EF 15% due to NICM. Has medtronic ICD. NYHA III. - Cardiac cath 6/19 demonstrated low cardiac output due to biventricular failure  - Pt will continue milrinone drip on discharge - Picc line placement 6/21 - Continue torsemide 40 mg bid  - Continue current digoxin, losartan, spironolactone.   Nausea, vomiting and abdominal pain - Possible from gastroparesis from history of diabetes - No acute findings on CT abdomen - LFTs, lipase, and UA were unremarkable.  - N/V resolved   Hypotension / Essential hypertension - BP low from heart failure. - Continue milrinone drip   CAD - Severe 3v- CAD s/p CABG with occluded grafts except for LIMA to LAD. No targets for revascularization. 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  - A1c 8.4 in 02/2016 - Patient can continue insulin regimen as prescribed, 20 units at bedtime and sliding scale insulin  Dyslipidemia associated with type 2 DM - Continue atorvastatin 20 mg at bedtime   Morbid obesity due to excess calories - Body mass index is 35.63 kg/(m^2) with comorbidities hypertension, diabetes, dyslipidemia  - Counseled on diet, nutrition   Hyponatremia - Due to CHF etiology - Sodium 131 this am, overall stable in past 48 hours   Depression - Continue lexapro    DVT prophylaxis: Lovenox subQ  Code Status: full code  Family Communication: no family at the  bedside this am    Consultants:   Cardiology  Procedures:   Cardiac cath 03/22/2016 - Low cardiac output due to biventricular failure  Antimicrobials:   None   Signed:  Leisa Lenz, MD  [facility] 03/25/2016, 9:32 AM  Pager #: [phone]  Time spent in minutes: more than 30  minutes   Discharge Exam: Filed Vitals:   03/25/16 0335 03/25/16 0744  BP: 97/69 90/70  Pulse:  92  Temp: 98 F (36.7 C) 97.9 F (36.6 C)  Resp: 18 22   Filed Vitals:   03/24/16 1937 03/24/16 2332 03/25/16 0335 03/25/16 0744  BP: 102/67 103/71 97/69 90/70   Pulse:    92  Temp: 97.8 F (36.6 C) 98.2 F (36.8 C) 98 F (36.7 C) 97.9 F (36.6 C)  TempSrc: Oral Oral Oral Oral  Resp: 17 21 18 22   Height:      Weight:   103.4 kg (227 lb 15.3 oz)   SpO2: 96% 96% 95% 97%    General: Pt is alert, follows commands appropriately, not in acute distress Cardiovascular: Rate controlled, S1/S2 + Respiratory: Clear to auscultation bilaterally, no wheezing, no crackles, no rhonchi Abdominal: Soft, non tender, non distended, bowel sounds +, no guarding Extremities: no edema, no cyanosis, pulses palpable bilaterally DP and PT Neuro: Grossly nonfocal  Discharge Instructions  Discharge Instructions    Call MD for:  difficulty breathing, headache or visual disturbances    Complete by:  As directed      Call MD for:  persistant dizziness or light-headedness    Complete by:  As directed      Call MD for:  persistant nausea and vomiting    Complete by:  As directed      Call MD for:  severe uncontrolled pain    Complete by:  As directed      Diet - low sodium heart healthy    Complete by:  As directed      Discharge instructions    Complete by:  As directed   Home health orders in place Pt needs milrinone drip at 0.25 mcg/kg/min PICC line per home care protocol     Increase activity slowly    Complete by:  As directed             Medication List    STOP taking these medications        metolazone 5 MG tablet  Commonly known as:  ZAROXOLYN      TAKE these medications        acetaminophen 500 MG tablet  Commonly known as:  TYLENOL  Take 1,000 mg by mouth every 6 (six) hours as needed for mild pain or headache.     albuterol 108 (90 Base) MCG/ACT inhaler  Commonly known  as:  PROVENTIL HFA;VENTOLIN HFA  Inhale 2 puffs into the lungs every 6 (six) hours as needed for wheezing or shortness of breath.     aspirin EC 81 MG tablet  Take 81 mg by mouth daily.     atorvastatin 20 MG tablet  Commonly known as:  LIPITOR  Take 1 tablet (20 mg total) by mouth every evening.     baclofen 10 MG tablet  Commonly known as:  LIORESAL  Take 10 mg by mouth 3 (three) times daily as needed for muscle spasms.     digoxin 0.25 MG tablet  Commonly known as:  LANOXIN  Take 1 tablet (0.25 mg total) by mouth daily.     escitalopram  20 MG tablet  Commonly known as:  LEXAPRO  Take 1 tablet (20 mg total) by mouth daily.     insulin aspart 100 UNIT/ML injection  Commonly known as:  novoLOG  Inject 0-15 Units into the skin 3 (three) times daily with meals. SLIDING SCALE     insulin glargine 100 UNIT/ML injection  Commonly known as:  LANTUS  Inject 0.2 mLs (20 Units total) into the skin at bedtime.     isosorbide mononitrate 30 MG 24 hr tablet  Commonly known as:  IMDUR  Take 1 tablet (30 mg total) by mouth daily.     losartan 25 MG tablet  Commonly known as:  COZAAR  Take 1 tablet (25 mg total) by mouth 2 (two) times daily.     milrinone 20 MG/100 ML Soln infusion  Commonly known as:  PRIMACOR  Inject 27.35 mcg/min into the vein continuous.     potassium chloride SA 20 MEQ tablet  Commonly known as:  K-DUR,KLOR-CON  Take 1 tablet (20 mEq total) by mouth 2 (two) times daily.     senna 8.6 MG Tabs tablet  Commonly known as:  SENOKOT  Take 2 tablets (17.2 mg total) by mouth at bedtime.     spironolactone 25 MG tablet  Commonly known as:  ALDACTONE  Take 1 tablet (25 mg total) by mouth daily.     torsemide 20 MG tablet  Commonly known as:  DEMADEX  Take 2 tablets (40 mg total) by mouth daily.     zolpidem 5 MG tablet  Commonly known as:  AMBIEN  Take 1 tablet (5 mg total) by mouth at bedtime as needed for sleep.           Follow-up Information     Follow up with Glori Bickers, MD On 04/05/2016.   Specialty:  Cardiology   Why:  at [facility] information:   [address] [place] [address] [phone]       Follow up with [facility] Schedule an appointment as soon as possible for a visit in 2 weeks.   Specialty:  General Practice   Why:  Follow up appt after recent hospitalization   Contact information:   [place] [place] [address] [phone]        The results of significant diagnostics from this hospitalization (including imaging, microbiology, ancillary and laboratory) are listed below for reference.    Significant Diagnostic Studies: Dg Chest 2 View  03/19/2016  CLINICAL DATA:  Chest pain with cough and shortness of breath for 1 day EXAM: CHEST  2 VIEW COMPARISON:  Feb 23, 2016 FINDINGS: There is no appreciable edema or consolidation. Heart is mildly enlarged, stable. Pulmonary vascularity is normal. Pacemaker leads are attached to the right atrium, right ventricle, and left ventricle. Patient is status post coronary artery bypass grafting. No adenopathy. There is degenerative change in the mid thoracic spine. IMPRESSION: Stable cardiomegaly. Pacemaker leads unchanged. No edema or consolidation. Electronically Signed   By: Lowella Grip III M.D.   On: 03/19/2016 13:42   Dg Abd 1 View  02/24/2016  CLINICAL DATA:  Lower abdomen pain for 2 weeks, nausea/vomiting for 3 days EXAM: ABDOMEN - 1 VIEW COMPARISON:  None. FINDINGS: Nonobstructive bowel gas pattern. Cholecystectomy clips noted. Hernia mesh seen over right low abdomen. No other abnormal opacities. IMPRESSION: No acute findings Electronically Signed   By: Skipper Cliche M.D.   On: 02/24/2016 11:18  Ct Abdomen Pelvis W Contrast  03/20/2016  CLINICAL DATA:  Acute right lower quadrant pain.  Vomiting. EXAM: CT ABDOMEN AND PELVIS WITH CONTRAST TECHNIQUE: Multidetector CT imaging of the abdomen  and pelvis was performed using the standard protocol following bolus administration of intravenous contrast. CONTRAST:  175mL ISOVUE-300 IOPAMIDOL (ISOVUE-300) INJECTION 61% COMPARISON:  Most recent CT 08/29/2015 FINDINGS: Lower chest: Multi chamber cardiomegaly. There is contrast refluxing into the IVC and hepatic veins. No pleural effusion. Lung bases are clear. Liver: Lobular hepatic contours. Liver appears prominent in size. No discrete focal lesion. Hepatobiliary: Clips in the gallbladder fossa postcholecystectomy. No biliary dilatation. Pancreas: No ductal dilatation or inflammation. Spleen: Upper normal in size measuring 13.1 x 5.4 x 12.6 cm (volume = 463.5 cc). 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 Left adrenal gland is normal. Kidneys: Symmetric renal enhancement. No hydronephrosis. No excretion on delayed phase imaging bilaterally. Stomach/Bowel: Stomach physiologically distended. There are no dilated or thickened small bowel loops. Small volume of stool throughout the colon without colonic wall thickening. Mild distal colonic diverticulosis without acute diverticulitis. The appendix is normal. Vascular/Lymphatic: No retroperitoneal adenopathy. Prominence celiac axis node measuring 1.6 cm is unchanged. Abdominal aorta is normal in caliber. Mild atherosclerosis. Replaced right hepatic artery. There is a retro aortic left renal vein. Reproductive: Normal sized prostate gland. Bladder: Minimally distended. Mild wall thickening may be secondary to degree of distension. Other: Small to moderate intra-abdominal ascites, most prominent in the right upper quadrant. Mild mesenteric edema. No loculated fluid collection/abscess. Post right inguinal hernia repair. Minimal whole body wall edema. Musculoskeletal: There are no acute or suspicious osseous abnormalities. Degenerative change in the spine. IMPRESSION: 1. Equivocal urinary bladder wall thickening, may be due to degree of distension,  however recommend correlation with urinalysis to exclude urinary tract infection. 2. Small to moderate volume intra-abdominal ascites. 3. Normal appendix. 4. Lack of renal excretion on delayed phase imaging, can be seen with underlying renal dysfunction. Recommend continued clinical follow-up. Electronically Signed   By: Jeb Levering M.D.   On: 03/20/2016 01:00   US Abdomen Limited  03/11/2016  CLINICAL DATA:  Localization of ascites EXAM: LIMITED ABDOMEN ULTRASOUND FOR ASCITES TECHNIQUE: Limited ultrasound survey for ascites was performed in all four abdominal quadrants. COMPARISON:  KUB of 02/24/2016 and ultrasound abdomen of 02/11/2016 FINDINGS: Ultrasound was performed to localize ascites for possible paracentesis. No satisfactory fluid collection is seen for paracentesis to be safely performed. IMPRESSION: Insufficient ascites for paracentesis. Electronically Signed   By: Ivar Drape M.D.   On: 03/11/2016 11:14    Microbiology: Recent Results (from the past 240 hour(s))  Urine culture     Status: Abnormal   Collection Time: 03/23/16  5:12 PM  Result Value Ref Range Status   Specimen Description URINE, CLEAN CATCH  Final   Special Requests NONE  Final   Culture MULTIPLE SPECIES PRESENT, SUGGEST RECOLLECTION (A)  Final   Report Status 03/24/2016 FINAL  Final     Labs: Basic Metabolic Panel:  Recent Labs Lab 03/21/16 0219 03/22/16 0213 03/22/16 1400 03/23/16 0345 03/24/16 0520 03/25/16 0530  NA 126* 128*  --  134* 135 131*  K 4.0 4.2  --  4.0 3.8 4.0  CL 90* 93*  --  96* 94* 91*  CO2 27 28  --  32 34* 33*  GLUCOSE 139* 156*  --  178* 117* 161*  BUN 40* 47*  --  33* 20 17  CREATININE 1.33* 1.28* 1.33* 1.16 0.94 0.91  CALCIUM 8.5* 8.3*  --  8.4* 9.2 8.9  MG  --   --   --  1.9  --   --    Liver Function Tests:  Recent Labs Lab 03/19/16 1214  AST 34  ALT 28  ALKPHOS 106  BILITOT 1.0  PROT 7.0  ALBUMIN 3.7    Recent Labs Lab 03/19/16 1214  LIPASE 25   No  results for input(s): AMMONIA in the last 168 hours. CBC:  Recent Labs Lab 03/22/16 0213 03/22/16 1400 03/23/16 0345 03/24/16 0520 03/25/16 0530  WBC 6.5 6.3 6.4 7.0 7.3  HGB 11.0* 11.2* 10.9* 10.9* 11.0*  HCT 33.7* 34.7* 34.3* 34.7* 34.2*  MCV 88.5 90.8 92.2 90.6 89.5  PLT 150 156 163 169 164   Cardiac Enzymes:  Recent Labs Lab 03/19/16 1856 03/20/16 0055 03/20/16 0646  TROPONINI 0.06* 0.05* 0.05*   BNP: BNP (last 3 results)  Recent Labs  03/09/16 1440 03/10/16 0930 03/19/16 1230  BNP 1634.5* 1369.8* 1079.6*    ProBNP (last 3 results) No results for input(s): PROBNP in the last 8760 hours.  CBG:  Recent Labs Lab 03/24/16 0743 03/24/16 1114 03/24/16 1627 03/24/16 2132 03/25/16 0743  GLUCAP 120* 116* 134* 289* 138*

## 2016-03-25 NOTE — Progress Notes (Signed)
CARDIAC REHAB PHASE I   PRE:  Rate/Rhythm: 89 SR  BP:  Sitting: 102/66        SaO2: 95 RA  MODE:  Ambulation: 350 ft   POST:  Rate/Rhythm: 103 paced  BP:  Sitting: 110/71         SaO2: 95 RA  Pt sitting on edge of bed this morning, states he is overwhelmed about going home with IV milrinone. Pt states "it is depressing. I'm not sure how much more I can take." Emotional support given to pt. Pt agreeable to walk. Pt ambulated 350 ft on RA, IV, assist x1, steady gait, tolerated fairly well. Pt c/o mild DOE, denies any other complaints. Pt was seen last month, had limited receptiveness to HF education at that time. Pt more receptive today. Reviewed CHF education including CHF booklet and zone tool, daily weights, sodium and fluid restrictions, and heart healthy diet, pt verbalized understanding. Pt states he has been adhering to his diet, fluid restrictions, and weighing himself daily. Phase 2 referral sent to [facility] last admission, however, pt may no longer eligible to participate with home milrinone. Pt aware. Pt to bed per pt request after walk, call bell within reach.    [id number] Lenna Sciara, RN, BSN 03/25/2016 10:10 AM

## 2016-03-30 ENCOUNTER — Encounter: Payer: Self-pay | Admitting: Internal Medicine

## 2016-03-31 ENCOUNTER — Telehealth (HOSPITAL_COMMUNITY): Payer: Self-pay | Admitting: *Deleted

## 2016-03-31 ENCOUNTER — Other Ambulatory Visit (HOSPITAL_COMMUNITY): Payer: Self-pay | Admitting: *Deleted

## 2016-03-31 NOTE — Telephone Encounter (Signed)
Ronald Miller with [facility] left vm stating she wanted pt to restart lexapro and that she saw there was an interaction between spiro and potassium. She needs orders to be able to give these medications. Called her back to let her know we are aware of the interactions between k&spiro also pt should still be on lexapro. No answer left vm for her to call me back.

## 2016-04-05 ENCOUNTER — Ambulatory Visit (HOSPITAL_COMMUNITY)
Admit: 2016-04-05 | Discharge: 2016-04-05 | Disposition: A | Discharge status: Home / Self Care | Payer: Medicare HMO | Source: Ambulatory Visit | Attending: Internal Medicine | Admitting: Internal Medicine

## 2016-04-05 VITALS — BP 94/54 | HR 102 | Wt 222.2 lb

## 2016-04-05 DIAGNOSIS — Z87891 Personal history of nicotine dependence: Secondary | ICD-10-CM | POA: Insufficient documentation

## 2016-04-05 DIAGNOSIS — Z794 Long term (current) use of insulin: Secondary | ICD-10-CM | POA: Diagnosis not present

## 2016-04-05 DIAGNOSIS — I110 Hypertensive heart disease with heart failure: Secondary | ICD-10-CM | POA: Insufficient documentation

## 2016-04-05 DIAGNOSIS — I159 Secondary hypertension, unspecified: Secondary | ICD-10-CM

## 2016-04-05 DIAGNOSIS — I5022 Chronic systolic (congestive) heart failure: Secondary | ICD-10-CM

## 2016-04-05 DIAGNOSIS — Z95810 Presence of automatic (implantable) cardiac defibrillator: Secondary | ICD-10-CM

## 2016-04-05 DIAGNOSIS — Z885 Allergy status to narcotic agent status: Secondary | ICD-10-CM | POA: Diagnosis not present

## 2016-04-05 DIAGNOSIS — Z7982 Long term (current) use of aspirin: Secondary | ICD-10-CM | POA: Diagnosis not present

## 2016-04-05 DIAGNOSIS — Z8249 Family history of ischemic heart disease and other diseases of the circulatory system: Secondary | ICD-10-CM | POA: Diagnosis not present

## 2016-04-05 DIAGNOSIS — I2510 Atherosclerotic heart disease of native coronary artery without angina pectoris: Secondary | ICD-10-CM

## 2016-04-05 DIAGNOSIS — Z79899 Other long term (current) drug therapy: Secondary | ICD-10-CM | POA: Diagnosis not present

## 2016-04-05 DIAGNOSIS — Z833 Family history of diabetes mellitus: Secondary | ICD-10-CM | POA: Diagnosis not present

## 2016-04-05 DIAGNOSIS — E785 Hyperlipidemia, unspecified: Secondary | ICD-10-CM | POA: Diagnosis not present

## 2016-04-05 DIAGNOSIS — R188 Other ascites: Secondary | ICD-10-CM | POA: Insufficient documentation

## 2016-04-05 DIAGNOSIS — F329 Major depressive disorder, single episode, unspecified: Secondary | ICD-10-CM | POA: Diagnosis not present

## 2016-04-05 DIAGNOSIS — K760 Fatty (change of) liver, not elsewhere classified: Secondary | ICD-10-CM | POA: Insufficient documentation

## 2016-04-05 DIAGNOSIS — I428 Other cardiomyopathies: Secondary | ICD-10-CM | POA: Diagnosis not present

## 2016-04-05 DIAGNOSIS — Z951 Presence of aortocoronary bypass graft: Secondary | ICD-10-CM | POA: Insufficient documentation

## 2016-04-05 DIAGNOSIS — J449 Chronic obstructive pulmonary disease, unspecified: Secondary | ICD-10-CM

## 2016-04-05 DIAGNOSIS — E119 Type 2 diabetes mellitus without complications: Secondary | ICD-10-CM | POA: Diagnosis not present

## 2016-04-05 LAB — BASIC METABOLIC PANEL
ANION GAP: 11 (ref 5–15)
BUN: 31 mg/dL — AB (ref 6–20)
CHLORIDE: 97 mmol/L — AB (ref 101–111)
CO2: 22 mmol/L (ref 22–32)
Calcium: 9.4 mg/dL (ref 8.9–10.3)
Creatinine, Ser: 1.02 mg/dL (ref 0.61–1.24)
GFR calc Af Amer: >60 mL/min (ref >60)
GFR calc non Af Amer: >60 mL/min (ref >60)
GLUCOSE: 135 mg/dL — AB (ref 65–99)
POTASSIUM: 4.4 mmol/L (ref 3.5–5.1)
SODIUM: 130 mmol/L — AB (ref 135–145)

## 2016-04-05 NOTE — Patient Instructions (Signed)
Lab today  Your physician has requested that you have an echocardiogram. Echocardiography is a painless test that uses sound waves to create images of your heart. It provides your doctor with information about the size and shape of your heart and how well your heart's chambers and valves are working. This procedure takes approximately one hour. There are no restrictions for this procedure.  Your physician recommends that you schedule a follow-up appointment in: 1 month

## 2016-04-05 NOTE — Progress Notes (Signed)
Patient ID: Ronald Miller., male   DOB: 08-13-1956, 60 y.o.   MRN: [id number]    Advanced Heart Failure Clinic Note   Referring Provider: Darylene Price, FNP Primary Care: [facility] Primary Cardiologist: Dr Haroldine Laws.   HPI:  Ronald Miller. is a 60 y.o. male with history of chronic systolic HF s/p Medtronic ICD 2014, CAD s/p CABG in 2000, HTN, Hx of cocaine abuse, Tobacco abuse, Depression, and COPD.   Echo 10/14/15 LVEF 20-25%, RV mildly dilated.   Admitted to [facility] from 4/24 - [id number] with A/C systolic HF.  Had a US guided Paracentesis with 5 L removed. They decreased his home lasix to 40 mg daily due to low blood pressure. BB held as well with bradycardia and hypotension.  Discharge weight 239 lbs  Admitted 5/17 with volume overload and CP. Echo 02/10/16 LVEF 15%, Mild MR, Mod LAE, Severe RAE, severely reduced RV function, PA peak pressure 31 mm Hg. Underwent paracentesis 02/10/16 with 5.2 L out. Ultrasound showed severe fatty liver. Had [facility] yesterday with severe native 3v CAD, All grafts occluded except LIMA to LAD with faint L to R collaterals, elevated filling pressures with normal cardiac output. No targets for revascularization. Weight on d/c 247 -> 219 pounds.   Admitted 5/22 through 5/24 with chest pain and dyspnea. Diuresed with IV lasix and transitioned to torsemide 40 /20 daily. Discharge weight 228 pounds.   Admitted 6/16-> 6/22 with HF exacerbation.  Cath done 03/22/16 with low cardiac output (CI 1.8) and biventricular failure. Started on milrinone drip and sent home on 0.25 mcg/kg/min.  Diuresed 14 lbs. D/c weight 227 lbs.   He presents for post hospital follow up. Started on milrinone as above during recent admission. Says he feels great. Weight at home 214-220. Up today which he attributes to eating a large watermelon every 2-3 days. No further CP. Denies lightheadedness or dizziness. No SOB walking around house or grocery store. Still has a hard time "going  to sleep", but sleeps better through the night. Drinking less than 2 liters. Limiting salt intake. Taking all medications as directed. Lives with his sister and brother in law. Followed by [facility] as well as [facility]. [facility] is managing his PICC line. Starts cardiac rehab 04/13/16  RHC 03/22/16 RA = 23 RV = 46/25 PA = 49/28 (36) PCW = 27 Fick cardiac output/index = 4.9/2.2 Thermo CO/Ci = 4.1/1.8 PVR = 2.2 WU Ao sat = 89% PA sat = 49%, 53% RA/PCWP = 0.85  R/LHC 02/12/16  Prox RCA lesion, 100% stenosed.  Prox Cx to Dist Cx lesion, 95% stenosed.  1st Mrg lesion, 100% stenosed.  2nd Mrg lesion, 100% stenosed.  Prox LAD lesion, 100% stenosed.  Ost LM to LM lesion, 50% stenosed.  Origin lesion, 100% stenosed.  Origin lesion, 100% stenosed.  Origin lesion, 100% stenosed.  Findings: Ao = 82/58 (69) LV = 84/15/27 RA = 14 RV = 45/11/17 PA = 49/24 (31) PCW = 28 Fick cardiac output/index = 5.3/2.4 PVR = 0.6 WU SVR = 837 FA sat = 98% PA sat = 63%, 62%  02/25/2016: K 3.5 Creatinine 1.03  03/10/2016: K 3.7 Creatinine 1.12   Past Medical History  Diagnosis Date  . ASCVD (arteriosclerotic cardiovascular disease)   . Diabetes mellitus   . Hypertension   . History of cocaine abuse   . Tobacco abuse   . Depression   . Suicidal ideation   . Chronic systolic CHF (  congestive heart failure) ([place])   . COPD (chronic obstructive pulmonary disease) ([place])   . Coronary artery disease   . AICD (automatic cardioverter/defibrillator) present   . Shortness of breath dyspnea     Current Outpatient Prescriptions  Medication Sig Dispense Refill  . acetaminophen (TYLENOL) 500 MG tablet Take 1,000 mg by mouth every 6 (six) hours as needed for mild pain or headache.    . albuterol (PROVENTIL HFA;VENTOLIN HFA) 108 (90 BASE) MCG/ACT inhaler Inhale 2 puffs into the lungs every 6 (six) hours as needed for wheezing or shortness of breath.    Marland Kitchen aspirin EC 81 MG tablet Take  81 mg by mouth daily.    Marland Kitchen atorvastatin (LIPITOR) 20 MG tablet Take 1 tablet (20 mg total) by mouth every evening. 30 tablet 0  . baclofen (LIORESAL) 10 MG tablet Take 10 mg by mouth 3 (three) times daily as needed for muscle spasms.    . digoxin (LANOXIN) 0.25 MG tablet Take 1 tablet (0.25 mg total) by mouth daily. 90 tablet 1  . escitalopram (LEXAPRO) 20 MG tablet Take 1 tablet (20 mg total) by mouth daily. 30 tablet 0  . insulin aspart (NOVOLOG) 100 UNIT/ML injection Inject 0-15 Units into the skin 3 (three) times daily with meals. SLIDING SCALE    . insulin glargine (LANTUS) 100 UNIT/ML injection Inject 0.2 mLs (20 Units total) into the skin at bedtime. 10 mL 0  . isosorbide mononitrate (IMDUR) 30 MG 24 hr tablet Take 1 tablet (30 mg total) by mouth daily. 30 tablet 3  . losartan (COZAAR) 25 MG tablet Take 1 tablet (25 mg total) by mouth 2 (two) times daily. 180 tablet 1  . milrinone (PRIMACOR) 20 MG/100 ML SOLN infusion Inject 27.35 mcg/min into the vein continuous. 1000 mL 0  . potassium chloride SA (K-DUR,KLOR-CON) 20 MEQ tablet Take 1 tablet (20 mEq total) by mouth 2 (two) times daily. 180 tablet 1  . senna (SENOKOT) 8.6 MG TABS tablet Take 2 tablets (17.2 mg total) by mouth at bedtime. 30 each 0  . spironolactone (ALDACTONE) 25 MG tablet Take 1 tablet (25 mg total) by mouth daily. 90 tablet 1  . torsemide (DEMADEX) 20 MG tablet Take 2 tablets (40 mg total) by mouth daily. 60 tablet 0  . zolpidem (AMBIEN) 5 MG tablet Take 1 tablet (5 mg total) by mouth at bedtime as needed for sleep. 30 tablet 0   No current facility-administered medications for this encounter.    Allergies  Allergen Reactions  . Codeine Nausea And Vomiting      Social History   Social History  . Marital Status: Single    Spouse Name: N/A  . Number of Children: N/A  . Years of Education: N/A   Occupational History  . Retired   . Truck driver    Social History Main Topics  . Smoking status: Former Smoker  -- 1.50 packs/day for 23 years    Types: Cigarettes    Quit date: 08/27/2015  . Smokeless tobacco: Never Used  . Alcohol Use: No  . Drug Use: No     Comment: Former  . Sexual Activity: Not on file   Other Topics Concern  . Not on file   Social History Narrative   Divorced with one child   No regular exercise      Family History  Problem Relation Age of Onset  . CAD Father   . Diabetes Father   . Heart failure Father  Filed Vitals:   04/05/16 1011  BP: 94/54  Pulse: 102  Weight: 222 lb 4 oz (100.812 kg)  SpO2: 94%     Wt Readings from Last 3 Encounters:  04/05/16 222 lb 4 oz (100.812 kg)  03/25/16 227 lb 15.3 oz (103.4 kg)  03/17/16 232 lb 6.4 oz (105.416 kg)     PHYSICAL EXAM: General:  Sitting on exam table No respiratory difficulty HEENT: normal Neck: supple. JVD 9-10.  Carotids 2+ bilat; no bruits. No thyromegaly or nodule noted.  Cor: PMI laterally displaced. RRR. No M/G/R Lungs: Clear to auscultation bilaterally, normal effort Abdomen: Obese, NT, ND, no HSM. No bruits or masses. +BS Extremities: no cyanosis, clubbing. R and LLE Chronic venous stasis changes with healing wound on R shin  Neuro: alert & oriented x 3, cranial nerves grossly intact. moves all 4 extremities w/o difficulty. Affect pleasant.   ASSESSMENT & PLAN:  1. Chronic Systolic HF with severe biventricular dysfunction EF 15% due to NICM. Has medtronic ICD.  - NYHA II symptoms now on milrinone.  - Volume status stable. Continue torsemide 40 mg twice a day.   - Continue losartan 25 bid. Cannot switch to [facility] now with soft BP - Continue imdur 30 mg.  - Continue digoxin - dig level 0.8 02/23/2016  - Continue spiro 25 mg daily.  - No BB for now with recent low output - Pt is stable to start cardiac rehab at  [place] ([facility]) on 04/13/16. - Instructed to follow low salt diet and limtiing fluids to 2 liters.   - Will need to be considered for advanced therapies including  transplant. RV dysfunction may limit opportunity for LVAD. Blood type O+. Hgb 8.4 on 02/10/16. 2. CAD --severe 3v- CAD s/p CABG with occluded grafts as above except for LIMA. No targets for revascularization. No CP.  --Continue ASA, statin  3. HTN -- On low end.  No med adjustments in light of this and low output.  4. DMII --followed by PCP. 5. Fatty liver with ascites -- continue with diet and weight loss. Needs to get DM2 under control 6. Ascites-  - Not distended currently with recent admission and initiation of milrinone.  - Most recent paracentesis 02/10/2016 with 5.2 liters removed.  - [place] several weeks ago without sufficient ascites for paracentesis.    [name] Friar, PA-C   Patient seen and examined with Oda Kilts, PA-C. We discussed all aspects of the encounter. I agree with the assessment and plan as stated above.   Recent cath results reviewed with him. He is much improved with milrinone. NYHA II. Volume status minimally elevated in setting of dietary noncompliance. Long talk about what it means to have Stage D HF. We discussed role of inotropes, VAD, transplant and Hospice. Given RV failure may not qualify for VAD. Will refer to [facility] for transplant evaluation. Discussed need for better compliance, in particular with his diet and getting hgba1c down < 8. He will start cardiac rehab at [facility]. We will check labs today.   Total time spent 45 minutes. Over half that time spent discussing above.   Shayley Medlin,MD 10:51 PM

## 2016-04-07 DIAGNOSIS — Z7189 Other specified counseling: Secondary | ICD-10-CM | POA: Insufficient documentation

## 2016-04-07 DIAGNOSIS — Z515 Encounter for palliative care: Secondary | ICD-10-CM | POA: Insufficient documentation

## 2016-04-14 ENCOUNTER — Telehealth (HOSPITAL_COMMUNITY): Payer: Self-pay | Admitting: *Deleted

## 2016-04-14 ENCOUNTER — Other Ambulatory Visit (INDEPENDENT_AMBULATORY_CARE_PROVIDER_SITE_OTHER): Payer: Self-pay | Admitting: Physician Assistant

## 2016-04-14 NOTE — Telephone Encounter (Signed)
Pt left a VM at 3:52 requesting a call back, no other info was provided.  Attempted to call pt back and Left message to call back

## 2016-04-15 ENCOUNTER — Other Ambulatory Visit (HOSPITAL_COMMUNITY): Payer: Self-pay | Admitting: *Deleted

## 2016-04-15 DIAGNOSIS — I5022 Chronic systolic (congestive) heart failure: Secondary | ICD-10-CM

## 2016-04-15 MED ORDER — ISOSORBIDE MONONITRATE ER 30 MG PO TB24: 30.0000 mg | ORAL_TABLET | Freq: Every day | ORAL | Status: DC

## 2016-04-15 MED ORDER — TORSEMIDE 20 MG PO TABS: 40.0000 mg | ORAL_TABLET | Freq: Two times a day (BID) | ORAL | Status: DC

## 2016-04-22 ENCOUNTER — Other Ambulatory Visit
Admission: RE | Admit: 2016-04-22 | Discharge: 2016-04-22 | Disposition: A | Discharge status: Home / Self Care | Payer: Medicare HMO | Source: Ambulatory Visit | Attending: Internal Medicine | Admitting: Internal Medicine

## 2016-04-22 ENCOUNTER — Telehealth (HOSPITAL_COMMUNITY): Payer: Self-pay

## 2016-04-22 DIAGNOSIS — I509 Heart failure, unspecified: Secondary | ICD-10-CM | POA: Diagnosis present

## 2016-04-22 LAB — CBC WITH DIFFERENTIAL/PLATELET
BASOS ABS: 0 10*3/uL (ref 0–0.1)
BASOS PCT: 0 %
Eosinophils Absolute: 0.1 10*3/uL (ref 0–0.7)
Eosinophils Relative: 2 %
HEMATOCRIT: 36.1 % — AB (ref 40.0–52.0)
Hemoglobin: 12.2 g/dL — ABNORMAL LOW (ref 13.0–18.0)
Lymphocytes Relative: 14 %
Lymphs Abs: 0.8 10*3/uL — ABNORMAL LOW (ref 1.0–3.6)
MCH: 30.8 pg (ref 26.0–34.0)
MCHC: 33.7 g/dL (ref 32.0–36.0)
MCV: 91.3 fL (ref 80.0–100.0)
MONO ABS: 0.4 10*3/uL (ref 0.2–1.0)
Monocytes Relative: 7 %
NEUTROS ABS: 4.6 10*3/uL (ref 1.4–6.5)
NEUTROS PCT: 77 %
Platelets: 110 10*3/uL — ABNORMAL LOW (ref 150–440)
RBC: 3.95 MIL/uL — AB (ref 4.40–5.90)
RDW: 16.4 % — AB (ref 11.5–14.5)
WBC: 6 10*3/uL (ref 3.8–10.6)

## 2016-04-22 LAB — COMPREHENSIVE METABOLIC PANEL
ALBUMIN: 4.4 g/dL (ref 3.5–5.0)
ALT: 30 U/L (ref 17–63)
ANION GAP: 9 (ref 5–15)
AST: 28 U/L (ref 15–41)
Alkaline Phosphatase: 119 U/L (ref 38–126)
BUN: 78 mg/dL — ABNORMAL HIGH (ref 6–20)
CHLORIDE: 98 mmol/L — AB (ref 101–111)
CO2: 27 mmol/L (ref 22–32)
Calcium: 9.2 mg/dL (ref 8.9–10.3)
Creatinine, Ser: 1.29 mg/dL — ABNORMAL HIGH (ref 0.61–1.24)
GFR calc non Af Amer: 59 mL/min — ABNORMAL LOW (ref >60)
Glucose, Bld: 194 mg/dL — ABNORMAL HIGH (ref 65–99)
POTASSIUM: 4.3 mmol/L (ref 3.5–5.1)
SODIUM: 134 mmol/L — AB (ref 135–145)
Total Bilirubin: 0.7 mg/dL (ref 0.3–1.2)
Total Protein: 7.5 g/dL (ref 6.5–8.1)

## 2016-04-22 LAB — MAGNESIUM: Magnesium: 2.3 mg/dL (ref 1.7–2.4)

## 2016-04-22 LAB — PROTIME-INR
INR: 1.06
PROTHROMBIN TIME: 14 s (ref 11.4–15.0)

## 2016-04-22 NOTE — Telephone Encounter (Signed)
[facility] RN left VM on chf clinic triage line reporting patient's weight last week was 216, this week is 225 lbs. Advised patient to take his PRN metolazone this morning and will recheck tomorrow. Will continue to follow, no other changes to be made at this time per [facility].  Renee Pain

## 2016-04-28 DIAGNOSIS — I5022 Chronic systolic (congestive) heart failure: Secondary | ICD-10-CM | POA: Diagnosis not present

## 2016-04-28 DIAGNOSIS — I10 Essential (primary) hypertension: Secondary | ICD-10-CM | POA: Diagnosis not present

## 2016-04-28 DIAGNOSIS — J449 Chronic obstructive pulmonary disease, unspecified: Secondary | ICD-10-CM | POA: Diagnosis not present

## 2016-04-28 DIAGNOSIS — I2510 Atherosclerotic heart disease of native coronary artery without angina pectoris: Secondary | ICD-10-CM | POA: Diagnosis not present

## 2016-04-29 ENCOUNTER — Telehealth (HOSPITAL_COMMUNITY): Payer: Self-pay | Admitting: Surgery

## 2016-04-29 NOTE — Telephone Encounter (Signed)
I received a call from Mr Pivirotto.  His [facility] nurse was in his home and needed to renew his order for [facility] care and [facility].  He remains on home Milrinone.  Per Oda Kilts PA I gave the verbal order from to continue services.

## 2016-05-07 ENCOUNTER — Encounter: Payer: Self-pay | Admitting: Internal Medicine

## 2016-05-12 ENCOUNTER — Ambulatory Visit (HOSPITAL_COMMUNITY)
Admission: RE | Admit: 2016-05-12 | Discharge: 2016-05-12 | Disposition: A | Discharge status: Home / Self Care | Payer: Medicare HMO | Source: Ambulatory Visit | Attending: Internal Medicine | Admitting: Internal Medicine

## 2016-05-12 ENCOUNTER — Ambulatory Visit (HOSPITAL_BASED_OUTPATIENT_CLINIC_OR_DEPARTMENT_OTHER)
Admission: RE | Admit: 2016-05-12 | Discharge: 2016-05-12 | Disposition: A | Discharge status: Home / Self Care | Payer: Medicare HMO | Source: Ambulatory Visit | Attending: Internal Medicine | Admitting: Internal Medicine

## 2016-05-12 ENCOUNTER — Encounter (HOSPITAL_COMMUNITY): Payer: Self-pay | Admitting: Internal Medicine

## 2016-05-12 VITALS — BP 116/60 | HR 83 | Wt 223.5 lb

## 2016-05-12 DIAGNOSIS — I159 Secondary hypertension, unspecified: Secondary | ICD-10-CM

## 2016-05-12 DIAGNOSIS — I5022 Chronic systolic (congestive) heart failure: Secondary | ICD-10-CM | POA: Diagnosis not present

## 2016-05-12 DIAGNOSIS — I110 Hypertensive heart disease with heart failure: Secondary | ICD-10-CM | POA: Diagnosis not present

## 2016-05-12 DIAGNOSIS — Z95810 Presence of automatic (implantable) cardiac defibrillator: Secondary | ICD-10-CM | POA: Diagnosis not present

## 2016-05-12 DIAGNOSIS — I358 Other nonrheumatic aortic valve disorders: Secondary | ICD-10-CM | POA: Insufficient documentation

## 2016-05-12 DIAGNOSIS — I2510 Atherosclerotic heart disease of native coronary artery without angina pectoris: Secondary | ICD-10-CM | POA: Diagnosis not present

## 2016-05-12 DIAGNOSIS — R29898 Other symptoms and signs involving the musculoskeletal system: Secondary | ICD-10-CM | POA: Diagnosis not present

## 2016-05-12 DIAGNOSIS — E119 Type 2 diabetes mellitus without complications: Secondary | ICD-10-CM | POA: Insufficient documentation

## 2016-05-12 DIAGNOSIS — J449 Chronic obstructive pulmonary disease, unspecified: Secondary | ICD-10-CM | POA: Diagnosis not present

## 2016-05-12 DIAGNOSIS — E1169 Type 2 diabetes mellitus with other specified complication: Secondary | ICD-10-CM

## 2016-05-12 MED ORDER — PERFLUTREN LIPID MICROSPHERE
INTRAVENOUS | Status: AC
  Filled 2016-05-12: qty 10

## 2016-05-12 MED ORDER — PERFLUTREN LIPID MICROSPHERE
1.0000 mL | INTRAVENOUS | Status: AC | PRN
  Administered 2016-05-12: 2 mL via INTRAVENOUS
  Filled 2016-05-12: qty 10

## 2016-05-12 NOTE — Patient Instructions (Signed)
You have been referred to [facility], they will contact you to schedule  Your physician recommends that you schedule a follow-up appointment in: 6-8 weeks

## 2016-05-12 NOTE — Progress Notes (Signed)
Patient ID: Ronald Miller., male   DOB: [birth year], 60 y.o.   MRN: [id number]    Advanced Heart Failure Clinic Note   Referring Provider: Darylene Price, FNP Primary Care: [facility] Primary Cardiologist: Dr Haroldine Laws.   HPI:  Ronald Miller. is a 60 y.o. male with history of chronic systolic HF s/p Medtronic ICD 2014, CAD s/p CABG in 2000, HTN, Hx of cocaine abuse, Tobacco abuse, Depression, and COPD.   Echo 10/14/15 LVEF 20-25%, RV mildly dilated.   Admitted to [facility] from 4/24 - [id number] with A/C systolic HF.  Had a US guided Paracentesis with 5 L removed. They decreased his home lasix to 40 mg daily due to low blood pressure. BB held as well with bradycardia and hypotension.  Discharge weight 239 lbs  Admitted 5/17 with volume overload and CP. Echo 02/10/16 LVEF 15%, Mild MR, Mod LAE, Severe RAE, severely reduced RV function, PA peak pressure 31 mm Hg. Underwent paracentesis 02/10/16 with 5.2 L out. Ultrasound showed severe fatty liver. Had [facility] yesterday with severe native 3v CAD, All grafts occluded except LIMA to LAD with faint L to R collaterals, elevated filling pressures with normal cardiac output. No targets for revascularization. Weight on d/c 247 -> 219 pounds.   Admitted 5/22 through 5/24 with chest pain and dyspnea. Diuresed with IV lasix and transitioned to torsemide 40 /20 daily. Discharge weight 228 pounds.   Admitted 6/16-> 6/22 with HF exacerbation.  Cath done 03/22/16 with low cardiac output (CI 1.8) and biventricular failure. Started on milrinone drip and sent home on 0.25 mcg/kg/min.  Diuresed 14 lbs. D/c weight 227 lbs.   He presents for regular follow up with Echo. Weight stable from last visit. Has overall felt OK, though a little more tired over the last few days.  Feels like he is able to be more active. Took metolazone twice in past month. Trying to watch his fluid and salt better. Denies CP, lightheadedness, or dizziness. Only minimal SOB on flat  ground, mostly when his fluid is up. Was relieved with metolazone.  Taking all medications as directed. Lives with his sister and brother in law. Followed [facility] at home. Waiting to hear back about Cardiac rehab (had to be out of hospital x 6 weeks before starting)  RHC 03/22/16 RA = 23 RV = 46/25 PA = 49/28 (36) PCW = 27 Fick cardiac output/index = 4.9/2.2 Thermo CO/Ci = 4.1/1.8 PVR = 2.2 WU Ao sat = 89% PA sat = 49%, 53% RA/PCWP = 0.85  R/LHC 02/12/16  Prox RCA lesion, 100% stenosed.  Prox Cx to Dist Cx lesion, 95% stenosed.  1st Mrg lesion, 100% stenosed.  2nd Mrg lesion, 100% stenosed.  Prox LAD lesion, 100% stenosed.  Ost LM to LM lesion, 50% stenosed.  Origin lesion, 100% stenosed.  Origin lesion, 100% stenosed.  Origin lesion, 100% stenosed.  Findings: Ao = 82/58 (69) LV = 84/15/27 RA = 14 RV = 45/11/17 PA = 49/24 (31) PCW = 28 Fick cardiac output/index = 5.3/2.4 PVR = 0.6 WU SVR = 837 FA sat = 98% PA sat = 63%, 62%  02/25/2016: K 3.5 Creatinine 1.03  03/10/2016: K 3.7 Creatinine 1.12   Past Medical History:  Diagnosis Date  . AICD (automatic cardioverter/defibrillator) present   . ASCVD (arteriosclerotic cardiovascular disease)   . Chronic systolic CHF (congestive heart failure) ([place])   . COPD (chronic obstructive pulmonary disease) ([place])   . Coronary artery disease   . Depression   .  Diabetes mellitus   . History of cocaine abuse   . Hypertension   . Shortness of breath dyspnea   . Suicidal ideation   . Tobacco abuse     Current Outpatient Prescriptions  Medication Sig Dispense Refill  . acetaminophen (TYLENOL) 500 MG tablet Take 1,000 mg by mouth every 6 (six) hours as needed for mild pain or headache.    . albuterol (PROVENTIL HFA;VENTOLIN HFA) 108 (90 BASE) MCG/ACT inhaler Inhale 2 puffs into the lungs every 6 (six) hours as needed for wheezing or shortness of breath.    Marland Kitchen aspirin EC 81 MG tablet Take 81 mg by mouth daily.    .  baclofen (LIORESAL) 10 MG tablet Take 10 mg by mouth 3 (three) times daily as needed for muscle spasms.    . digoxin (LANOXIN) 0.25 MG tablet Take 1 tablet (0.25 mg total) by mouth daily. 90 tablet 1  . escitalopram (LEXAPRO) 20 MG tablet Take 1 tablet (20 mg total) by mouth daily. 30 tablet 0  . insulin aspart (NOVOLOG) 100 UNIT/ML injection Inject 0-15 Units into the skin 3 (three) times daily with meals. SLIDING SCALE    . isosorbide mononitrate (IMDUR) 30 MG 24 hr tablet Take 1 tablet (30 mg total) by mouth daily. 30 tablet 3  . losartan (COZAAR) 25 MG tablet Take 1 tablet (25 mg total) by mouth 2 (two) times daily. 180 tablet 1  . milrinone (PRIMACOR) 20 MG/100 ML SOLN infusion Inject 27.35 mcg/min into the vein continuous. 1000 mL 0  . potassium chloride SA (K-DUR,KLOR-CON) 20 MEQ tablet Take 1 tablet (20 mEq total) by mouth 2 (two) times daily. 180 tablet 1  . senna (SENOKOT) 8.6 MG TABS tablet Take 2 tablets (17.2 mg total) by mouth at bedtime. 30 each 0  . spironolactone (ALDACTONE) 25 MG tablet Take 1 tablet (25 mg total) by mouth daily. 90 tablet 1  . torsemide (DEMADEX) 20 MG tablet Take 2 tablets (40 mg total) by mouth 2 (two) times daily. 120 tablet 3  . zolpidem (AMBIEN) 5 MG tablet Take 1 tablet (5 mg total) by mouth at bedtime as needed for sleep. 30 tablet 0   No current facility-administered medications for this encounter.    Facility-Administered Medications Ordered in Other Encounters  Medication Dose Route Frequency Provider Last Rate Last Dose  . perflutren lipid microspheres (DEFINITY) IV suspension  1-10 mL Intravenous PRN Jolaine Artist, MD   2 mL at 05/12/16 1357    Allergies  Allergen Reactions  . Codeine Nausea And Vomiting      Social History   Social History  . Marital status: Single    Spouse name: N/A  . Number of children: N/A  . Years of education: N/A   Occupational History  . Retired   . Truck driver    Social History Main Topics  .  Smoking status: Former Smoker    Packs/day: 1.50    Years: 23.00    Types: Cigarettes    Quit date: 08/27/2015  . Smokeless tobacco: Never Used  . Alcohol use No  . Drug use: No     Comment: Former  . Sexual activity: Not on file   Other Topics Concern  . Not on file   Social History Narrative   Divorced with one child   No regular exercise      Family History  Problem Relation Age of Onset  . CAD Father   . Diabetes Father   . Heart failure  Father     Vitals:   05/12/16 1417  BP: 116/60  Pulse: 83  SpO2: 96%  Weight: 223 lb 8 oz (101.4 kg)     Wt Readings from Last 3 Encounters:  05/12/16 223 lb 8 oz (101.4 kg)  04/05/16 222 lb 4 oz (100.8 kg)  03/25/16 227 lb 15.3 oz (103.4 kg)     PHYSICAL EXAM: General:  Sitting on exam table No respiratory difficulty HEENT: normal Neck: supple. JVD 9-10.  Carotids 2+ bilat; no bruits. No thyromegaly or nodule noted.  Cor: PMI laterally displaced. RRR. No M/G/R Lungs: Clear to auscultation bilaterally, normal effort Abdomen: Obese, NT, ND, no HSM. No bruits or masses. +BS Extremities: no cyanosis, clubbing. R and LLE Chronic venous stasis changes with healing wound on R shin  Neuro: alert & oriented x 3, cranial nerves grossly intact. moves all 4 extremities w/o difficulty. Affect pleasant.   ASSESSMENT & PLAN:  1. Chronic Systolic HF with severe biventricular dysfunction EF 15% due to NICM. Has medtronic ICD.  - NYHA II symptoms now on milrinone.  - Volume status stable. Continue torsemide 40 mg twice a day.   - Continue losartan 25 bid. Cannot switch to [facility] now with soft BP - Continue imdur 30 mg.  - Continue digoxin - dig level 0.8 02/23/2016  - Continue spiro 25 mg daily.  - No BB for now with recent low output - Pt is stable to start cardiac rehab at  [place] ([facility]) on 04/13/16. - Instructed to follow low salt diet and limtiing fluids to 2 liters.   - Will need to be considered for advanced therapies  including transplant. RV dysfunction may limit opportunity for LVAD. Blood type O+. Hgb 8.4 on 02/10/16. 2. CAD --severe 3v- CAD s/p CABG with occluded grafts as above except for LIMA. No targets for revascularization. No CP.  --Continue ASA, statin  3. HTN - Stable.   4. DMII - Followed by PCP. 5. Fatty liver with ascites - Continue with diet and weight loss. Needs to get DM2 under control 6. Ascites-  - Not distended currently with recent admission and initiation of milrinone.  - Most recent paracentesis 02/10/2016 with 5.2 liters removed.  - [place] several weeks ago without sufficient ascites for paracentesis.    [name] Friar, PA-C    Patient seen and examined with Oda Kilts, PA-C. We discussed all aspects of the encounter. I agree with the assessment and plan as stated above.   He is doing very well on milrinone NYHA II-III. He has lost over 40 pounds over the past few months. He has severe biventricular failure and is not a VAD candidate. Will refer to [facility] for transplant evaluation. Volume status looks good.   Willadeen Colantuono,MD 9:28 PM

## 2016-05-12 NOTE — Progress Notes (Signed)
Advanced Heart Failure Medication Review by a Pharmacist  Does the patient  feel that his/her medications are working for him/her?  yes  Has the patient been experiencing any side effects to the medications prescribed?  no  Does the patient measure his/her own blood pressure or blood glucose at home?  yes   Does the patient have any problems obtaining medications due to transportation or finances?   no  Understanding of regimen: good Understanding of indications: good Potential of compliance: good Patient understands to avoid NSAIDs. Patient understands to avoid decongestants.  Issues to address at subsequent visits: None   Pharmacist comments:  Mr. Zuba is a pleasant 60 yo M presenting with his medication bottles. He reports good compliance with his regimen. He did notice that he has been more tired throughout the day lately. His Lexapro that he takes in the am could be contributing to this so I have recommended trying to take it in the evening instead. He did not have any other medication-related questions or concerns for me at this time.   Ruta Hinds. Velva Harman, PharmD, BCPS, CPP Clinical Pharmacist Pager: [phone] Phone: [phone] 05/12/2016 2:38 PM      Time with patient: 10 minutes Preparation and documentation time: 2 minutes Total time: 12 minutes

## 2016-05-12 NOTE — Progress Notes (Signed)
  Echocardiogram 2D Echocardiogram has been performed with definity.  Aggie Cosier 05/12/2016, 2:02 PM

## 2016-05-14 ENCOUNTER — Encounter (HOSPITAL_COMMUNITY): Payer: Self-pay | Admitting: *Deleted

## 2016-05-14 NOTE — Progress Notes (Signed)
Pt's records and referral faxed to [facility] at [phone]

## 2016-05-17 ENCOUNTER — Telehealth (HOSPITAL_COMMUNITY): Payer: Self-pay | Admitting: Cardiology

## 2016-05-17 NOTE — Telephone Encounter (Signed)
Message to [facility] representative Darlina Guys with orders for labs (bmet/cbc) and prn nurse visit for increased SOB  No answer in inform patient

## 2016-05-17 NOTE — Telephone Encounter (Signed)
Patient called to report increased weight since last OV. Weight today 229 lbs. C/o increased SOB, nausea, and fatigue (just not feeling well) Patient seen by PCP today and advised to keep a close watch on R lower leg "weeping". States his abdomen is not tight  Patient has been taking torsemide 40 BID, took a dose of metolazone today with extra potassium as prescribed.  Advised patient he should take PM dose of torsemide and follow up with office in the AM. If symptoms worsen can head to ER overnight otherwise he has already done well with diuretic adjustments for symptom management. Can send to provider for further instructions if needed  Patient reports he will follow up in the AM with update  Oda Kilts, PA please advise further if needed

## 2016-05-17 NOTE — Telephone Encounter (Signed)
Due labs this week per [facility].  BMET/CBC.  Have them go out tomorrow if possible and check on him.      If not improving with metolazone can check [place] later this week in Clinic.    Legrand Como [address] [place], PA-C 05/17/2016 3:20 PM

## 2016-05-18 NOTE — Telephone Encounter (Signed)
Per Jiles Crocker, [facility] representative patient is no longer active with [facility]. Will review with provider for further orders

## 2016-05-20 ENCOUNTER — Telehealth (HOSPITAL_COMMUNITY): Payer: Self-pay

## 2016-05-20 ENCOUNTER — Other Ambulatory Visit
Admission: RE | Admit: 2016-05-20 | Discharge: 2016-05-20 | Disposition: A | Discharge status: Home / Self Care | Payer: Medicare HMO | Source: Ambulatory Visit | Attending: Internal Medicine | Admitting: Internal Medicine

## 2016-05-20 DIAGNOSIS — I5042 Chronic combined systolic (congestive) and diastolic (congestive) heart failure: Secondary | ICD-10-CM | POA: Diagnosis present

## 2016-05-20 LAB — CBC WITH DIFFERENTIAL/PLATELET
BASOS ABS: 0 10*3/uL (ref 0–0.1)
Basophils Relative: 0 %
EOS PCT: 2 %
Eosinophils Absolute: 0.1 10*3/uL (ref 0–0.7)
HEMATOCRIT: 39 % — AB (ref 40.0–52.0)
Hemoglobin: 13.5 g/dL (ref 13.0–18.0)
LYMPHS ABS: 1.2 10*3/uL (ref 1.0–3.6)
LYMPHS PCT: 15 %
MCH: 31.4 pg (ref 26.0–34.0)
MCHC: 34.5 g/dL (ref 32.0–36.0)
MCV: 91.1 fL (ref 80.0–100.0)
MONO ABS: 0.7 10*3/uL (ref 0.2–1.0)
MONOS PCT: 9 %
NEUTROS ABS: 5.8 10*3/uL (ref 1.4–6.5)
Neutrophils Relative %: 74 %
PLATELETS: 148 10*3/uL — AB (ref 150–440)
RBC: 4.28 MIL/uL — ABNORMAL LOW (ref 4.40–5.90)
RDW: 15.3 % — AB (ref 11.5–14.5)
WBC: 7.9 10*3/uL (ref 3.8–10.6)

## 2016-05-20 LAB — COMPREHENSIVE METABOLIC PANEL
ALT: 17 U/L (ref 17–63)
ANION GAP: 10 (ref 5–15)
AST: 25 U/L (ref 15–41)
Albumin: 4.7 g/dL (ref 3.5–5.0)
Alkaline Phosphatase: 89 U/L (ref 38–126)
BILIRUBIN TOTAL: 0.8 mg/dL (ref 0.3–1.2)
BUN: 66 mg/dL — AB (ref 6–20)
CHLORIDE: 90 mmol/L — AB (ref 101–111)
CO2: 31 mmol/L (ref 22–32)
Calcium: 9.4 mg/dL (ref 8.9–10.3)
Creatinine, Ser: 1.76 mg/dL — ABNORMAL HIGH (ref 0.61–1.24)
GFR, EST AFRICAN AMERICAN: 47 mL/min — AB (ref >60)
GFR, EST NON AFRICAN AMERICAN: 41 mL/min — AB (ref >60)
Glucose, Bld: 200 mg/dL — ABNORMAL HIGH (ref 65–99)
POTASSIUM: 4.4 mmol/L (ref 3.5–5.1)
Sodium: 131 mmol/L — ABNORMAL LOW (ref 135–145)
TOTAL PROTEIN: 8 g/dL (ref 6.5–8.1)

## 2016-05-20 LAB — PROTIME-INR
INR: 0.94
PROTHROMBIN TIME: 12.6 s (ref 11.4–15.2)

## 2016-05-20 LAB — MAGNESIUM: Magnesium: 2 mg/dL (ref 1.7–2.4)

## 2016-05-20 NOTE — Telephone Encounter (Signed)
Pt returned call from the clinic, he states he was calling earlier to let us know he was doing much better.  He states wt was at 229 lb on Mon and today he is down to 216 lb after taking metolazone this week.

## 2016-05-20 NOTE — Telephone Encounter (Signed)
Patient called CHF clinic triage line and left VM saying he called a few other times this week and no one has called him back.  There is a phone note dated the 14th where a CMA from this office was working on [facility] orders and f/u.  Unsure if this is pertaining to what patient is following up on. Attempted to return call to patient to follow up with needs, no answer, no VM. Will attempt to follow up again later today.  Renee Pain, RN

## 2016-05-21 NOTE — Telephone Encounter (Signed)
Order sent to [place]

## 2016-05-24 ENCOUNTER — Encounter: Payer: Medicare HMO | Attending: Internal Medicine | Admitting: *Deleted

## 2016-05-24 VITALS — BP 122/64

## 2016-05-24 DIAGNOSIS — Z95810 Presence of automatic (implantable) cardiac defibrillator: Secondary | ICD-10-CM | POA: Diagnosis not present

## 2016-05-24 DIAGNOSIS — Z87891 Personal history of nicotine dependence: Secondary | ICD-10-CM | POA: Insufficient documentation

## 2016-05-24 DIAGNOSIS — I5022 Chronic systolic (congestive) heart failure: Secondary | ICD-10-CM | POA: Diagnosis present

## 2016-05-24 NOTE — Progress Notes (Signed)
Daily Session Note  Patient Details  Name: Braelyn Bordonaro. MRN: [id number] Date of Birth: [birth year] Referring Provider:   Flowsheet Row Cardiac Rehab from 05/24/2016 in [facility] Cardiac and Pulmonary Rehab  Referring Provider  Glori Bickers MD      Encounter Date: 05/24/2016  Check In:     Session Check In - 05/24/16 1518      Check-In   Location [facility]-Cardiac & Pulmonary Rehab   Staff Present Gerlene Burdock, RN, Levie Heritage, MA, ACSM RCEP, Exercise Physiologist   Supervising physician immediately available to respond to emergencies See telemetry face sheet for immediately available ER MD   Medication changes reported     No   Fall or balance concerns reported    No   Warm-up and Cool-down Performed on first and last piece of equipment   Resistance Training Performed No   VAD Patient? No     Pain Assessment   Currently in Pain? No/denies         Goals Met:  Personal goals reviewed  Goals Unmet:  Not Applicable  Comments: ]    Dr. Emily Filbert is Medical Director for [facility] and [facility].

## 2016-05-24 NOTE — Progress Notes (Signed)
Cardiac Individual Treatment Plan  Patient Details  Name: Ronald Miller. MRN: [id number] Date of Birth: 1955/12/16 Referring Provider:   Flowsheet Row Cardiac Rehab from 05/24/2016 in [facility] Cardiac and Pulmonary Rehab  Referring Provider  Glori Bickers MD      Initial Encounter Date:  Flowsheet Row Cardiac Rehab from 05/24/2016 in [facility] Cardiac and Pulmonary Rehab  Date  05/24/16  Referring Provider  Glori Bickers MD      Visit Diagnosis: Heart failure, chronic systolic ([place])  Patient's Home Medications on Admission:  Current Outpatient Prescriptions:  .  acetaminophen (TYLENOL) 500 MG tablet, Take 1,000 mg by mouth every 6 (six) hours as needed for mild pain or headache., Disp: , Rfl:  .  albuterol (PROVENTIL HFA;VENTOLIN HFA) 108 (90 BASE) MCG/ACT inhaler, Inhale 2 puffs into the lungs every 6 (six) hours as needed for wheezing or shortness of breath., Disp: , Rfl:  .  aspirin EC 81 MG tablet, Take 81 mg by mouth daily., Disp: , Rfl:  .  atorvastatin (LIPITOR) 20 MG tablet, Take 40 mg by mouth daily., Disp: , Rfl:  .  baclofen (LIORESAL) 10 MG tablet, Take 10 mg by mouth 3 (three) times daily as needed for muscle spasms., Disp: , Rfl:  .  digoxin (LANOXIN) 0.25 MG tablet, Take 1 tablet (0.25 mg total) by mouth daily., Disp: 90 tablet, Rfl: 1 .  escitalopram (LEXAPRO) 20 MG tablet, Take 1 tablet (20 mg total) by mouth daily., Disp: 30 tablet, Rfl: 0 .  insulin aspart (NOVOLOG) 100 UNIT/ML injection, Inject 10 Units into the skin 3 (three) times daily before meals., Disp: , Rfl:  .  insulin glargine (LANTUS) 100 UNIT/ML injection, Inject 40 Units into the skin at bedtime., Disp: , Rfl:  .  isosorbide mononitrate (IMDUR) 30 MG 24 hr tablet, Take 1 tablet (30 mg total) by mouth daily., Disp: 30 tablet, Rfl: 3 .  losartan (COZAAR) 25 MG tablet, Take 1 tablet (25 mg total) by mouth 2 (two) times daily., Disp: 180 tablet, Rfl: 1 .  metolazone (ZAROXOLYN) 5 MG tablet, Take 5 mg by  mouth as needed (if weight >229 lb)., Disp: , Rfl:  .  metoprolol succinate (TOPROL-XL) 100 MG 24 hr tablet, Take 100 mg by mouth daily. Take with or immediately following a meal., Disp: , Rfl:  .  milrinone (PRIMACOR) 20 MG/100 ML SOLN infusion, Inject 27.35 mcg/min into the vein continuous., Disp: 1000 mL, Rfl: 0 .  Omega-3 Fatty Acids (FISH OIL) 1000 MG CAPS, Take 1,000 mg by mouth daily., Disp: , Rfl:  .  potassium chloride SA (K-DUR,KLOR-CON) 20 MEQ tablet, Take 1 tablet (20 mEq total) by mouth 2 (two) times daily., Disp: 180 tablet, Rfl: 1 .  senna (SENOKOT) 8.6 MG TABS tablet, Take 2 tablets (17.2 mg total) by mouth at bedtime., Disp: 30 each, Rfl: 0 .  spironolactone (ALDACTONE) 25 MG tablet, Take 1 tablet (25 mg total) by mouth daily., Disp: 90 tablet, Rfl: 1 .  torsemide (DEMADEX) 20 MG tablet, Take 2 tablets (40 mg total) by mouth 2 (two) times daily., Disp: 120 tablet, Rfl: 3 .  zolpidem (AMBIEN) 5 MG tablet, Take 1 tablet (5 mg total) by mouth at bedtime as needed for sleep. (Patient not taking: Reported on 05/12/2016), Disp: 30 tablet, Rfl: 0  Past Medical History: Past Medical History:  Diagnosis Date  . AICD (automatic cardioverter/defibrillator) present   . ASCVD (arteriosclerotic cardiovascular disease)   . Chronic systolic CHF (congestive heart failure) ([place])   .  COPD (chronic obstructive pulmonary disease) ([place])   . Coronary artery disease   . Depression   . Diabetes mellitus   . History of cocaine abuse   . Hypertension   . Shortness of breath dyspnea   . Suicidal ideation   . Tobacco abuse     Tobacco Use: History  Smoking Status  . Former Smoker  . Packs/day: 1.50  . Years: 23.00  . Types: Cigarettes  . Quit date: 08/27/2015  Smokeless Tobacco  . Never Used    Labs: Recent Review Flowsheet Data    Labs for ITP Cardiac and Pulmonary Rehab Latest Ref Rng & Units 03/22/2016 03/22/2016 03/23/2016 03/24/2016 03/25/2016   Cholestrol 0 - 200 mg/dL - - - - -    LDLCALC 0 - 100 mg/dL - - - - -   HDL 40 - 60 mg/dL - - - - -   Trlycerides 0 - 200 mg/dL - - - - -   Hemoglobin A1c 4.8 - 5.6 % - - - - -   PHART 7.350 - 7.450 - - - - -   PCO2ART 35.0 - 45.0 mmHg - - - - -   HCO3 20.0 - 24.0 mEq/L 31.0(H) - - - -   TCO2 0 - 100 mmol/L 33 - - - -   O2SAT % 48.0 60.7 58.4 61.6 59.8       Exercise Target Goals: Date: 05/24/16  Exercise Program Goal: Individual exercise prescription set with THRR, safety & activity barriers. Participant demonstrates ability to understand and report RPE using BORG scale, to self-measure pulse accurately, and to acknowledge the importance of the exercise prescription.  Exercise Prescription Goal: Starting with aerobic activity 30 plus minutes a day, 3 days per week for initial exercise prescription. Provide home exercise prescription and guidelines that participant acknowledges understanding prior to discharge.  Activity Barriers & Risk Stratification:     Activity Barriers & Cardiac Risk Stratification - 05/24/16 1516      Activity Barriers & Cardiac Risk Stratification   Activity Barriers Back Problems;Shortness of Breath   Cardiac Risk Stratification High      6 Minute Walk:     6 Minute Walk    Row Name 05/24/16 1343         6 Minute Walk   Phase Initial     Distance 1200 feet     Walk Time 6 minutes     # of Rest Breaks 0     MPH 2.27     METS 3.09     RPE 13     VO2 Peak 10.82     Symptoms Yes (comment)     Comments muscles in chest (intercostals) and calves sore, ankles were sore     Resting HR 81 bpm     Resting BP 122/64     Max Ex. HR 109 bpm     Max Ex. BP 126/62     2 Minute Post BP 126/64       Interval Oxygen   Interval Oxygen? Yes     Baseline Oxygen Saturation % 94 %     Baseline Liters of Oxygen 0 L  Room Air     1 Minute Oxygen Saturation % 92 %     1 Minute Liters of Oxygen 0 L     2 Minute Oxygen Saturation % 92 %     2 Minute Liters of Oxygen 0 L     3 Minute Oxygen  Saturation % 93 %  3 Minute Liters of Oxygen 0 L     4 Minute Oxygen Saturation % 94 %     4 Minute Liters of Oxygen 0 L     5 Minute Oxygen Saturation % 94 %     5 Minute Liters of Oxygen 0 L     6 Minute Oxygen Saturation % 94 %     6 Minute Liters of Oxygen 0 L     2 Minute Post Oxygen Saturation % 97 %     2 Minute Post Liters of Oxygen 0 L        Initial Exercise Prescription:     Initial Exercise Prescription - 05/24/16 1300      Date of Initial Exercise RX and Referring Provider   Date 05/24/16   Referring Provider Glori Bickers MD     Treadmill   MPH 2   Grade 1   Minutes 15   METs 2.81     NuStep   Level 3  80-100 spm   Minutes 15   METs 2     Recumbant Elliptical   Level 2   RPM 50   Minutes 15   METs 2     Prescription Details   Frequency (times per week) 2   Duration Progress to 45 minutes of aerobic exercise without signs/symptoms of physical distress     Intensity   THRR 40-80% of Max Heartrate 113-145   Ratings of Perceived Exertion 11-13   Perceived Dyspnea 0-4     Progression   Progression Continue to progress workloads to maintain intensity without signs/symptoms of physical distress.     Resistance Training   Training Prescription Yes   Weight 3 lbs   Reps 10-15      Perform Capillary Blood Glucose checks as needed.  Exercise Prescription Changes:   Exercise Comments:   Discharge Exercise Prescription (Final Exercise Prescription Changes):   Nutrition:  Target Goals: Understanding of nutrition guidelines, daily intake of sodium 1500mg , cholesterol 200mg , calories 30% from fat and 7% or less from saturated fats, daily to have 5 or more servings of fruits and vegetables.  Biometrics:    Nutrition Therapy Plan and Nutrition Goals:     Nutrition Therapy & Goals - 05/24/16 1521      Nutrition Therapy   Drug/Food Interactions Statins/Certain Fruits     Intervention Plan   Intervention Prescribe, educate and  counsel regarding individualized specific dietary modifications aiming towards targeted core components such as weight, hypertension, lipid management, diabetes, heart failure and other comorbidities.   Expected Outcomes Short Term Goal: Understand basic principles of dietary content, such as calories, fat, sodium, cholesterol and nutrients.;Long Term Goal: Adherence to prescribed nutrition plan.;Short Term Goal: A plan has been developed with personal nutrition goals set during dietitian appointment.      Nutrition Discharge: Rate Your Plate Scores:   Nutrition Goals Re-Evaluation:   Psychosocial: Target Goals: Acknowledge presence or absence of depression, maximize coping skills, provide positive support system. Participant is able to verbalize types and ability to use techniques and skills needed for reducing stress and depression.  Initial Review & Psychosocial Screening:     Initial Psych Review & Screening - 05/24/16 1522      Initial Review   Current issues with History of Depression     Family Dynamics   Good Support System? Yes   Comments History of depression is listed in [name]'s history. When I mentioned that he said what doctor wrote that -I don't  have that.       Quality of Life Scores:   PHQ-9: Recent Review Flowsheet Data    Depression screen [facility] 2/9 05/24/2016 02/03/2016 11/06/2015   Decreased Interest 1 0 1   Down, Depressed, Hopeless 0 0 2   PHQ - 2 Score 1 0 3   Altered sleeping 2 - -   Tired, decreased energy 1 - -   Change in appetite 0 - -   Feeling bad or failure about yourself  1 - -   Trouble concentrating 0 - -   Moving slowly or fidgety/restless 2 - -   Suicidal thoughts 0 - 0   PHQ-9 Score 7 - -   Difficult doing work/chores Not difficult at all - -      Psychosocial Evaluation and Intervention:   Psychosocial Re-Evaluation:   Vocational Rehabilitation: Provide vocational rehab assistance to qualifying candidates.   Vocational Rehab  Evaluation & Intervention:     Vocational Rehab - 05/24/16 1309      Initial Vocational Rehab Evaluation & Intervention   Assessment shows need for Vocational Rehabilitation No      Education: Education Goals: Education classes will be provided on a weekly basis, covering required topics. Participant will state understanding/return demonstration of topics presented.  Learning Barriers/Preferences:     Learning Barriers/Preferences - 05/24/16 1516      Learning Barriers/Preferences   Learning Barriers None   Learning Preferences None      Education Topics: General Nutrition Guidelines/Fats and Fiber: -Group instruction provided by verbal, written material, models and posters to present the general guidelines for heart healthy nutrition. Gives an explanation and review of dietary fats and fiber.   Controlling Sodium/Reading Food Labels: -Group verbal and written material supporting the discussion of sodium use in heart healthy nutrition. Review and explanation with models, verbal and written materials for utilization of the food label.   Exercise Physiology & Risk Factors: - Group verbal and written instruction with models to review the exercise physiology of the cardiovascular system and associated critical values. Details cardiovascular disease risk factors and the goals associated with each risk factor.   Aerobic Exercise & Resistance Training: - Gives group verbal and written discussion on the health impact of inactivity. On the components of aerobic and resistive training programs and the benefits of this training and how to safely progress through these programs.   Flexibility, Balance, General Exercise Guidelines: - Provides group verbal and written instruction on the benefits of flexibility and balance training programs. Provides general exercise guidelines with specific guidelines to those with heart or lung disease. Demonstration and skill practice  provided.   Stress Management: - Provides group verbal and written instruction about the health risks of elevated stress, cause of high stress, and healthy ways to reduce stress.   Depression: - Provides group verbal and written instruction on the correlation between heart/lung disease and depressed mood, treatment options, and the stigmas associated with seeking treatment.   Anatomy & Physiology of the Heart: - Group verbal and written instruction and models provide basic cardiac anatomy and physiology, with the coronary electrical and arterial systems. Review of: AMI, Angina, Valve disease, Heart Failure, Cardiac Arrhythmia, Pacemakers, and the ICD.   Cardiac Procedures: - Group verbal and written instruction and models to describe the testing methods done to diagnose heart disease. Reviews the outcomes of the test results. Describes the treatment choices: Medical Management, Angioplasty, or Coronary Bypass Surgery.   Cardiac Medications: - Group verbal and written instruction  to review commonly prescribed medications for heart disease. Reviews the medication, class of the drug, and side effects. Includes the steps to properly store meds and maintain the prescription regimen.   Go Sex-Intimacy & Heart Disease, Get SMART - Goal Setting: - Group verbal and written instruction through game format to discuss heart disease and the return to sexual intimacy. Provides group verbal and written material to discuss and apply goal setting through the application of the S.M.A.R.T. Method.   Other Matters of the Heart: - Provides group verbal, written materials and models to describe Heart Failure, Angina, Valve Disease, and Diabetes in the realm of heart disease. Includes description of the disease process and treatment options available to the cardiac patient.   Exercise & Equipment Safety: - Individual verbal instruction and demonstration of equipment use and safety with use of the  equipment. Flowsheet Row Cardiac Rehab from 05/24/2016 in [facility] Cardiac and Pulmonary Rehab  Date  05/24/16  Educator  C. EnterkinRN  Instruction Review Code  1- partially meets, needs review/practice      Infection Prevention: - Provides verbal and written material to individual with discussion of infection control including proper hand washing and proper equipment cleaning during exercise session. Flowsheet Row Cardiac Rehab from 05/24/2016 in [facility] Cardiac and Pulmonary Rehab  Date  05/24/16  Educator  C. [place]  Instruction Review Code  1- partially meets, needs review/practice      Falls Prevention: - Provides verbal and written material to individual with discussion of falls prevention and safety. Flowsheet Row Cardiac Rehab from 05/24/2016 in [facility] Cardiac and Pulmonary Rehab  Date  05/24/16  Educator  C. [place]  Instruction Review Code  2- meets goals/outcomes      Diabetes: - Individual verbal and written instruction to review signs/symptoms of diabetes, desired ranges of glucose level fasting, after meals and with exercise. Advice that pre and post exercise glucose checks will be done for 3 sessions at entry of program. [place] from 05/24/2016 in [facility] Cardiac and Pulmonary Rehab  Date  05/24/16  Educator  C. [place]  Instruction Review Code  1- partially meets, needs review/practice       Knowledge Questionnaire Score:     Knowledge Questionnaire Score - 05/24/16 1305      Knowledge Questionnaire Score   Pre Score 24      Core Components/Risk Factors/Patient Goals at Admission:     Personal Goals and Risk Factors at Admission - 05/24/16 1521      Core Components/Risk Factors/Patient Goals on Admission    Weight Management Yes;Weight Maintenance;Weight Loss   Intervention Weight Management: Develop a combined nutrition and exercise program designed to reach desired caloric intake, while maintaining appropriate intake of nutrient  and fiber, sodium and fats, and appropriate energy expenditure required for the weight goal.;Weight Management: Provide education and appropriate resources to help participant work on and attain dietary goals.   Hypertension Yes   Intervention Provide education on lifestyle modifcations including regular physical activity/exercise, weight management, moderate sodium restriction and increased consumption of fresh fruit, vegetables, and low fat dairy, alcohol moderation, and smoking cessation.;Monitor prescription use compliance.   Expected Outcomes Short Term: Continued assessment and intervention until BP is < 140/62mm HG in hypertensive participants. < 130/35mm HG in hypertensive participants with diabetes, heart failure or chronic kidney disease.;Long Term: Maintenance of blood pressure at goal levels.      Core Components/Risk Factors/Patient Goals Review:    Core Components/Risk Factors/Patient Goals at Discharge (Final Review):  ITP Comments:     ITP Comments    Row Name 05/24/16 1519 05/24/16 1523         ITP Comments "[name]" is interested in getting his mother to exercise with him since she has [employer], government. A Cardiac Rehab REgistered dietician appt was made for [name] since he said he has problems what to eat with his diabetes and now heart problems with a 2 liter fluid restriction/day. History of depression is listed in [name]'s history. When I mentioned that he said what doctor wrote that -I don't have that.          Comments:

## 2016-05-24 NOTE — Progress Notes (Signed)
Cardiac Individual Treatment Plan  Patient Details  Name: Ronald Miller. MRN: [id number] Date of Birth: 1955-11-07 Referring Provider:   Flowsheet Row Cardiac Rehab from 05/24/2016 in [facility] Cardiac and Pulmonary Rehab  Referring Provider  Glori Bickers MD      Initial Encounter Date:  Flowsheet Row Cardiac Rehab from 05/24/2016 in [facility] Cardiac and Pulmonary Rehab  Date  05/24/16  Referring Provider  Glori Bickers MD      Visit Diagnosis: Heart failure, chronic systolic ([place])  Patient's Home Medications on Admission:  Current Outpatient Prescriptions:  .  acetaminophen (TYLENOL) 500 MG tablet, Take 1,000 mg by mouth every 6 (six) hours as needed for mild pain or headache., Disp: , Rfl:  .  albuterol (PROVENTIL HFA;VENTOLIN HFA) 108 (90 BASE) MCG/ACT inhaler, Inhale 2 puffs into the lungs every 6 (six) hours as needed for wheezing or shortness of breath., Disp: , Rfl:  .  aspirin EC 81 MG tablet, Take 81 mg by mouth daily., Disp: , Rfl:  .  atorvastatin (LIPITOR) 20 MG tablet, Take 40 mg by mouth daily., Disp: , Rfl:  .  baclofen (LIORESAL) 10 MG tablet, Take 10 mg by mouth 3 (three) times daily as needed for muscle spasms., Disp: , Rfl:  .  digoxin (LANOXIN) 0.25 MG tablet, Take 1 tablet (0.25 mg total) by mouth daily., Disp: 90 tablet, Rfl: 1 .  escitalopram (LEXAPRO) 20 MG tablet, Take 1 tablet (20 mg total) by mouth daily., Disp: 30 tablet, Rfl: 0 .  insulin aspart (NOVOLOG) 100 UNIT/ML injection, Inject 10 Units into the skin 3 (three) times daily before meals., Disp: , Rfl:  .  insulin glargine (LANTUS) 100 UNIT/ML injection, Inject 40 Units into the skin at bedtime., Disp: , Rfl:  .  isosorbide mononitrate (IMDUR) 30 MG 24 hr tablet, Take 1 tablet (30 mg total) by mouth daily., Disp: 30 tablet, Rfl: 3 .  losartan (COZAAR) 25 MG tablet, Take 1 tablet (25 mg total) by mouth 2 (two) times daily., Disp: 180 tablet, Rfl: 1 .  metolazone (ZAROXOLYN) 5 MG tablet, Take 5 mg by  mouth as needed (if weight >229 lb)., Disp: , Rfl:  .  metoprolol succinate (TOPROL-XL) 100 MG 24 hr tablet, Take 100 mg by mouth daily. Take with or immediately following a meal., Disp: , Rfl:  .  milrinone (PRIMACOR) 20 MG/100 ML SOLN infusion, Inject 27.35 mcg/min into the vein continuous., Disp: 1000 mL, Rfl: 0 .  Omega-3 Fatty Acids (FISH OIL) 1000 MG CAPS, Take 1,000 mg by mouth daily., Disp: , Rfl:  .  potassium chloride SA (K-DUR,KLOR-CON) 20 MEQ tablet, Take 1 tablet (20 mEq total) by mouth 2 (two) times daily., Disp: 180 tablet, Rfl: 1 .  senna (SENOKOT) 8.6 MG TABS tablet, Take 2 tablets (17.2 mg total) by mouth at bedtime., Disp: 30 each, Rfl: 0 .  spironolactone (ALDACTONE) 25 MG tablet, Take 1 tablet (25 mg total) by mouth daily., Disp: 90 tablet, Rfl: 1 .  torsemide (DEMADEX) 20 MG tablet, Take 2 tablets (40 mg total) by mouth 2 (two) times daily., Disp: 120 tablet, Rfl: 3 .  zolpidem (AMBIEN) 5 MG tablet, Take 1 tablet (5 mg total) by mouth at bedtime as needed for sleep. (Patient not taking: Reported on 05/12/2016), Disp: 30 tablet, Rfl: 0  Past Medical History: Past Medical History:  Diagnosis Date  . AICD (automatic cardioverter/defibrillator) present   . ASCVD (arteriosclerotic cardiovascular disease)   . Chronic systolic CHF (congestive heart failure) ([place])   .  COPD (chronic obstructive pulmonary disease) ([place])   . Coronary artery disease   . Depression   . Diabetes mellitus   . History of cocaine abuse   . Hypertension   . Shortness of breath dyspnea   . Suicidal ideation   . Tobacco abuse     Tobacco Use: History  Smoking Status  . Former Smoker  . Packs/day: 1.50  . Years: 23.00  . Types: Cigarettes  . Quit date: 08/27/2015  Smokeless Tobacco  . Never Used    Labs: Recent Review Flowsheet Data    Labs for ITP Cardiac and Pulmonary Rehab Latest Ref Rng & Units 03/22/2016 03/22/2016 03/23/2016 03/24/2016 03/25/2016   Cholestrol 0 - 200 mg/dL - - - - -    LDLCALC 0 - 100 mg/dL - - - - -   HDL 40 - 60 mg/dL - - - - -   Trlycerides 0 - 200 mg/dL - - - - -   Hemoglobin A1c 4.8 - 5.6 % - - - - -   PHART 7.350 - 7.450 - - - - -   PCO2ART 35.0 - 45.0 mmHg - - - - -   HCO3 20.0 - 24.0 mEq/L 31.0(H) - - - -   TCO2 0 - 100 mmol/L 33 - - - -   O2SAT % 48.0 60.7 58.4 61.6 59.8       Exercise Target Goals: Date: 05/24/16  Exercise Program Goal: Individual exercise prescription set with THRR, safety & activity barriers. Participant demonstrates ability to understand and report RPE using BORG scale, to self-measure pulse accurately, and to acknowledge the importance of the exercise prescription.  Exercise Prescription Goal: Starting with aerobic activity 30 plus minutes a day, 3 days per week for initial exercise prescription. Provide home exercise prescription and guidelines that participant acknowledges understanding prior to discharge.  Activity Barriers & Risk Stratification:     Activity Barriers & Cardiac Risk Stratification - 05/24/16 1516      Activity Barriers & Cardiac Risk Stratification   Activity Barriers Back Problems;Shortness of Breath   Cardiac Risk Stratification High      6 Minute Walk:     6 Minute Walk    Row Name 05/24/16 1343         6 Minute Walk   Phase Initial     Distance 1200 feet     Walk Time 6 minutes     # of Rest Breaks 0     MPH 2.27     METS 3.09     RPE 13     VO2 Peak 10.82     Symptoms Yes (comment)     Comments muscles in chest (intercostals) and calves sore, ankles were sore     Resting HR 81 bpm     Resting BP 122/64     Max Ex. HR 109 bpm     Max Ex. BP 126/62     2 Minute Post BP 126/64       Interval Oxygen   Interval Oxygen? Yes     Baseline Oxygen Saturation % 94 %     Baseline Liters of Oxygen 0 L  Room Air     1 Minute Oxygen Saturation % 92 %     1 Minute Liters of Oxygen 0 L     2 Minute Oxygen Saturation % 92 %     2 Minute Liters of Oxygen 0 L     3 Minute Oxygen  Saturation % 93 %  3 Minute Liters of Oxygen 0 L     4 Minute Oxygen Saturation % 94 %     4 Minute Liters of Oxygen 0 L     5 Minute Oxygen Saturation % 94 %     5 Minute Liters of Oxygen 0 L     6 Minute Oxygen Saturation % 94 %     6 Minute Liters of Oxygen 0 L     2 Minute Post Oxygen Saturation % 97 %     2 Minute Post Liters of Oxygen 0 L        Initial Exercise Prescription:     Initial Exercise Prescription - 05/24/16 1300      Date of Initial Exercise RX and Referring Provider   Date 05/24/16   Referring Provider Glori Bickers MD     Treadmill   MPH 2   Grade 1   Minutes 15   METs 2.81     NuStep   Level 3  80-100 spm   Minutes 15   METs 2     Recumbant Elliptical   Level 2   RPM 50   Minutes 15   METs 2     Prescription Details   Frequency (times per week) 2   Duration Progress to 45 minutes of aerobic exercise without signs/symptoms of physical distress     Intensity   THRR 40-80% of Max Heartrate 113-145   Ratings of Perceived Exertion 11-13   Perceived Dyspnea 0-4     Progression   Progression Continue to progress workloads to maintain intensity without signs/symptoms of physical distress.     Resistance Training   Training Prescription Yes   Weight 3 lbs   Reps 10-15      Perform Capillary Blood Glucose checks as needed.  Exercise Prescription Changes:   Exercise Comments:   Discharge Exercise Prescription (Final Exercise Prescription Changes):   Nutrition:  Target Goals: Understanding of nutrition guidelines, daily intake of sodium 1500mg , cholesterol 200mg , calories 30% from fat and 7% or less from saturated fats, daily to have 5 or more servings of fruits and vegetables.  Biometrics:    Nutrition Therapy Plan and Nutrition Goals:     Nutrition Therapy & Goals - 05/24/16 1521      Nutrition Therapy   Drug/Food Interactions Statins/Certain Fruits     Intervention Plan   Intervention Prescribe, educate and  counsel regarding individualized specific dietary modifications aiming towards targeted core components such as weight, hypertension, lipid management, diabetes, heart failure and other comorbidities.   Expected Outcomes Short Term Goal: Understand basic principles of dietary content, such as calories, fat, sodium, cholesterol and nutrients.;Long Term Goal: Adherence to prescribed nutrition plan.;Short Term Goal: A plan has been developed with personal nutrition goals set during dietitian appointment.      Nutrition Discharge: Rate Your Plate Scores:   Nutrition Goals Re-Evaluation:   Psychosocial: Target Goals: Acknowledge presence or absence of depression, maximize coping skills, provide positive support system. Participant is able to verbalize types and ability to use techniques and skills needed for reducing stress and depression.  Initial Review & Psychosocial Screening:     Initial Psych Review & Screening - 05/24/16 1522      Initial Review   Current issues with History of Depression     Family Dynamics   Good Support System? Yes   Comments History of depression is listed in [name]'s history. When I mentioned that he said what doctor wrote that -I don't  have that.       Quality of Life Scores:     Quality of Life - 05/24/16 1856      Quality of Life Scores   Health/Function Pre 19.7 %   Socioeconomic Pre 18.43 %   Psych/Spiritual Pre 26.79 %   Family Pre 20.4 %   GLOBAL Pre 21 %      PHQ-9: Recent Review Flowsheet Data    Depression screen [facility] 2/9 05/24/2016 02/03/2016 11/06/2015   Decreased Interest 1 0 1   Down, Depressed, Hopeless 0 0 2   PHQ - 2 Score 1 0 3   Altered sleeping 2 - -   Tired, decreased energy 1 - -   Change in appetite 0 - -   Feeling bad or failure about yourself  1 - -   Trouble concentrating 0 - -   Moving slowly or fidgety/restless 2 - -   Suicidal thoughts 0 - 0   PHQ-9 Score 7 - -   Difficult doing work/chores Not difficult at all - -       Psychosocial Evaluation and Intervention:   Psychosocial Re-Evaluation:   Vocational Rehabilitation: Provide vocational rehab assistance to qualifying candidates.   Vocational Rehab Evaluation & Intervention:     Vocational Rehab - 05/24/16 1309      Initial Vocational Rehab Evaluation & Intervention   Assessment shows need for Vocational Rehabilitation No      Education: Education Goals: Education classes will be provided on a weekly basis, covering required topics. Participant will state understanding/return demonstration of topics presented.  Learning Barriers/Preferences:     Learning Barriers/Preferences - 05/24/16 1516      Learning Barriers/Preferences   Learning Barriers None   Learning Preferences None      Education Topics: General Nutrition Guidelines/Fats and Fiber: -Group instruction provided by verbal, written material, models and posters to present the general guidelines for heart healthy nutrition. Gives an explanation and review of dietary fats and fiber.   Controlling Sodium/Reading Food Labels: -Group verbal and written material supporting the discussion of sodium use in heart healthy nutrition. Review and explanation with models, verbal and written materials for utilization of the food label.   Exercise Physiology & Risk Factors: - Group verbal and written instruction with models to review the exercise physiology of the cardiovascular system and associated critical values. Details cardiovascular disease risk factors and the goals associated with each risk factor.   Aerobic Exercise & Resistance Training: - Gives group verbal and written discussion on the health impact of inactivity. On the components of aerobic and resistive training programs and the benefits of this training and how to safely progress through these programs.   Flexibility, Balance, General Exercise Guidelines: - Provides group verbal and written instruction on the benefits  of flexibility and balance training programs. Provides general exercise guidelines with specific guidelines to those with heart or lung disease. Demonstration and skill practice provided.   Stress Management: - Provides group verbal and written instruction about the health risks of elevated stress, cause of high stress, and healthy ways to reduce stress.   Depression: - Provides group verbal and written instruction on the correlation between heart/lung disease and depressed mood, treatment options, and the stigmas associated with seeking treatment.   Anatomy & Physiology of the Heart: - Group verbal and written instruction and models provide basic cardiac anatomy and physiology, with the coronary electrical and arterial systems. Review of: AMI, Angina, Valve disease, Heart Failure, Cardiac Arrhythmia, Pacemakers, and  the ICD.   Cardiac Procedures: - Group verbal and written instruction and models to describe the testing methods done to diagnose heart disease. Reviews the outcomes of the test results. Describes the treatment choices: Medical Management, Angioplasty, or Coronary Bypass Surgery.   Cardiac Medications: - Group verbal and written instruction to review commonly prescribed medications for heart disease. Reviews the medication, class of the drug, and side effects. Includes the steps to properly store meds and maintain the prescription regimen.   Go Sex-Intimacy & Heart Disease, Get SMART - Goal Setting: - Group verbal and written instruction through game format to discuss heart disease and the return to sexual intimacy. Provides group verbal and written material to discuss and apply goal setting through the application of the S.M.A.R.T. Method.   Other Matters of the Heart: - Provides group verbal, written materials and models to describe Heart Failure, Angina, Valve Disease, and Diabetes in the realm of heart disease. Includes description of the disease process and treatment  options available to the cardiac patient.   Exercise & Equipment Safety: - Individual verbal instruction and demonstration of equipment use and safety with use of the equipment. Flowsheet Row Cardiac Rehab from 05/24/2016 in [facility] Cardiac and Pulmonary Rehab  Date  05/24/16  Educator  C. EnterkinRN  Instruction Review Code  1- partially meets, needs review/practice      Infection Prevention: - Provides verbal and written material to individual with discussion of infection control including proper hand washing and proper equipment cleaning during exercise session. Flowsheet Row Cardiac Rehab from 05/24/2016 in [facility] Cardiac and Pulmonary Rehab  Date  05/24/16  Educator  C. [place]  Instruction Review Code  1- partially meets, needs review/practice      Falls Prevention: - Provides verbal and written material to individual with discussion of falls prevention and safety. Flowsheet Row Cardiac Rehab from 05/24/2016 in [facility] Cardiac and Pulmonary Rehab  Date  05/24/16  Educator  C. [place]  Instruction Review Code  2- meets goals/outcomes      Diabetes: - Individual verbal and written instruction to review signs/symptoms of diabetes, desired ranges of glucose level fasting, after meals and with exercise. Advice that pre and post exercise glucose checks will be done for 3 sessions at entry of program. [place] from 05/24/2016 in [facility] Cardiac and Pulmonary Rehab  Date  05/24/16  Educator  C. [place]  Instruction Review Code  1- partially meets, needs review/practice       Knowledge Questionnaire Score:     Knowledge Questionnaire Score - 05/24/16 1305      Knowledge Questionnaire Score   Pre Score 24      Core Components/Risk Factors/Patient Goals at Admission:     Personal Goals and Risk Factors at Admission - 05/24/16 1521      Core Components/Risk Factors/Patient Goals on Admission    Weight Management Yes;Weight Maintenance;Weight Loss    Intervention Weight Management: Develop a combined nutrition and exercise program designed to reach desired caloric intake, while maintaining appropriate intake of nutrient and fiber, sodium and fats, and appropriate energy expenditure required for the weight goal.;Weight Management: Provide education and appropriate resources to help participant work on and attain dietary goals.   Hypertension Yes   Intervention Provide education on lifestyle modifcations including regular physical activity/exercise, weight management, moderate sodium restriction and increased consumption of fresh fruit, vegetables, and low fat dairy, alcohol moderation, and smoking cessation.;Monitor prescription use compliance.   Expected Outcomes Short Term: Continued assessment and intervention  until BP is < 140/14mm HG in hypertensive participants. < 130/48mm HG in hypertensive participants with diabetes, heart failure or chronic kidney disease.;Long Term: Maintenance of blood pressure at goal levels.      Core Components/Risk Factors/Patient Goals Review:    Core Components/Risk Factors/Patient Goals at Discharge (Final Review):    ITP Comments:     ITP Comments    Row Name 05/24/16 1519 05/24/16 1523         ITP Comments "[name]" is interested in getting his mother to exercise with him since she has [employer], government. A Cardiac Rehab REgistered dietician appt was made for [name] since he said he has problems what to eat with his diabetes and now heart problems with a 2 liter fluid restriction/day. History of depression is listed in [name]'s history. When I mentioned that he said what doctor wrote that -I don't have that.          Comments:

## 2016-05-24 NOTE — Patient Instructions (Signed)
Patient Instructions  Patient Details  Name: Ronald Miller. MRN: [id number] Date of Birth: 02/03/1956 Referring Provider:  Jolaine Artist, MD  Below are the personal goals you chose as well as exercise and nutrition goals. Our goal is to help you keep on track towards obtaining and maintaining your goals. We will be discussing your progress on these goals with you throughout the program.  Initial Exercise Prescription:     Initial Exercise Prescription - 05/24/16 1300      Date of Initial Exercise RX and Referring Provider   Date 05/24/16   Referring Provider Glori Bickers MD     Treadmill   MPH 2   Grade 1   Minutes 15   METs 2.81     NuStep   Level 3  80-100 spm   Minutes 15   METs 2     Recumbant Elliptical   Level 2   RPM 50   Minutes 15   METs 2     Prescription Details   Frequency (times per week) 2   Duration Progress to 45 minutes of aerobic exercise without signs/symptoms of physical distress     Intensity   THRR 40-80% of Max Heartrate 113-145   Ratings of Perceived Exertion 11-13   Perceived Dyspnea 0-4     Progression   Progression Continue to progress workloads to maintain intensity without signs/symptoms of physical distress.     Resistance Training   Training Prescription Yes   Weight 3 lbs   Reps 10-15      Exercise Goals: Frequency: Be able to perform aerobic exercise three times per week working toward 3-5 days per week.  Intensity: Work with a perceived exertion of 11 (fairly light) - 15 (hard) as tolerated. Follow your new exercise prescription and watch for changes in prescription as you progress with the program. Changes will be reviewed with you when they are made.  Duration: You should be able to do 30 minutes of continuous aerobic exercise in addition to a 5 minute warm-up and a 5 minute cool-down routine.  Nutrition Goals: Your personal nutrition goals will be established when you do your nutrition analysis with the  dietician.  The following are nutrition guidelines to follow: Cholesterol < 200mg /day Sodium < 1500mg /day Fiber: Men over 50 yrs - 30 grams per day  Personal Goals:     Personal Goals and Risk Factors at Admission - 05/24/16 1521      Core Components/Risk Factors/Patient Goals on Admission    Weight Management Yes;Weight Maintenance;Weight Loss   Intervention Weight Management: Develop a combined nutrition and exercise program designed to reach desired caloric intake, while maintaining appropriate intake of nutrient and fiber, sodium and fats, and appropriate energy expenditure required for the weight goal.;Weight Management: Provide education and appropriate resources to help participant work on and attain dietary goals.   Hypertension Yes   Intervention Provide education on lifestyle modifcations including regular physical activity/exercise, weight management, moderate sodium restriction and increased consumption of fresh fruit, vegetables, and low fat dairy, alcohol moderation, and smoking cessation.;Monitor prescription use compliance.   Expected Outcomes Short Term: Continued assessment and intervention until BP is < 140/63mm HG in hypertensive participants. < 130/73mm HG in hypertensive participants with diabetes, heart failure or chronic kidney disease.;Long Term: Maintenance of blood pressure at goal levels.      Tobacco Use Initial Evaluation: History  Smoking Status  . Former Smoker  . Packs/day: 1.50  . Years: 23.00  . Types: Cigarettes  .  Quit date: 08/27/2015  Smokeless Tobacco  . Never Used    Copy of goals given to participant.

## 2016-05-27 ENCOUNTER — Encounter: Payer: Medicare HMO | Admitting: *Deleted

## 2016-05-27 DIAGNOSIS — I5022 Chronic systolic (congestive) heart failure: Secondary | ICD-10-CM | POA: Diagnosis not present

## 2016-05-27 LAB — GLUCOSE, CAPILLARY
GLUCOSE-CAPILLARY: 153 mg/dL — AB (ref 65–99)
GLUCOSE-CAPILLARY: 86 mg/dL (ref 65–99)

## 2016-05-27 NOTE — Progress Notes (Signed)
Daily Session Note  Patient Details  Name: Ronald Miller. MRN: [id number] Date of Birth: December 04, 1955 Referring Provider:   Flowsheet Row Cardiac Rehab from 05/24/2016 in [facility] Cardiac and Pulmonary Rehab  Referring Provider  Glori Bickers MD      Encounter Date: 05/27/2016  Check In:     Session Check In - 05/27/16 1053      Check-In   Location [facility]-Cardiac & Pulmonary Rehab   Staff Present Nada Maclachlan, BA, ACSM CEP, Exercise Physiologist;Susanne Bice, RN, BSN, CCRP;Najir Roop Luan Pulling, MA, ACSM RCEP, Exercise Physiologist   Supervising physician immediately available to respond to emergencies See telemetry face sheet for immediately available ER MD   Medication changes reported     No   Fall or balance concerns reported    No   Warm-up and Cool-down Performed on first and last piece of equipment   Resistance Training Performed Yes   VAD Patient? No     Pain Assessment   Currently in Pain? No/denies   Multiple Pain Sites No         Goals Met:  Independence with exercise equipment Exercise tolerated well No report of cardiac concerns or symptoms Strength training completed today  Goals Unmet:  Not Applicable  Comments: First full day of exercise!  Patient was oriented to gym and equipment including functions, settings, policies, and procedures.  Patient's individual exercise prescription and treatment plan were reviewed.  All starting workloads were established based on the results of the 6 minute walk test done at initial orientation visit.  The plan for exercise progression was also introduced and progression will be customized based on patient's performance and goals.  [name] did need to reduce workloads on the treadmill today for his first day. He also had a dizzy spell when he finished the treadmill as his blood pressure had dropped.  He was given water and recovered; he was able to finish the rest of the class without symptoms.  His blood sugar dropped during exercise  as he only had cantaloupe and watermelon for breakfast.  He was given PB crackers and encouraged to eat protein for breakfast.    Dr. Emily Filbert is Medical Director for [place] and [facility].

## 2016-06-01 ENCOUNTER — Telehealth: Payer: Self-pay | Admitting: *Deleted

## 2016-06-01 ENCOUNTER — Encounter: Payer: Self-pay | Admitting: *Deleted

## 2016-06-01 NOTE — Telephone Encounter (Signed)
Ronald Miller called and said he is sorry that he is not here in Cardiac Rehab today. He is afraid they are going to put him in the hospital due to cellulitis in his legs.

## 2016-06-08 ENCOUNTER — Encounter: Payer: Medicare HMO | Attending: Internal Medicine

## 2016-06-08 ENCOUNTER — Telehealth (HOSPITAL_COMMUNITY): Payer: Self-pay

## 2016-06-08 DIAGNOSIS — Z95810 Presence of automatic (implantable) cardiac defibrillator: Secondary | ICD-10-CM | POA: Insufficient documentation

## 2016-06-08 DIAGNOSIS — Z87891 Personal history of nicotine dependence: Secondary | ICD-10-CM | POA: Insufficient documentation

## 2016-06-08 DIAGNOSIS — I5022 Chronic systolic (congestive) heart failure: Secondary | ICD-10-CM | POA: Insufficient documentation

## 2016-06-08 MED ORDER — TORSEMIDE 20 MG PO TABS: 40.0000 mg | ORAL_TABLET | Freq: Two times a day (BID) | ORAL | 3 refills | Status: DC

## 2016-06-08 NOTE — Telephone Encounter (Signed)
Patient called to verify Torsemide dose and ask for refill. Patient's torsemide dose was recently increased to 40 mg twice daily, which patient verifies he has been doing, however, bottle still says old dose (20 mg twice daily). Rx was updated to wrong pharmacy at the last dose change. New Rx sent to [place] per patient request for 90 day supply. Patient reports feeling great with no questions/cocnerns.  Renee Pain, RN

## 2016-06-09 ENCOUNTER — Encounter: Payer: Self-pay | Admitting: *Deleted

## 2016-06-09 DIAGNOSIS — I5022 Chronic systolic (congestive) heart failure: Secondary | ICD-10-CM

## 2016-06-16 ENCOUNTER — Encounter: Payer: Self-pay | Admitting: *Deleted

## 2016-06-16 DIAGNOSIS — I5022 Chronic systolic (congestive) heart failure: Secondary | ICD-10-CM

## 2016-06-16 NOTE — Progress Notes (Signed)
Cardiac Individual Treatment Plan  Patient Details  Name: Ronald Miller. MRN: [id number] Date of Birth: 01-02-1956 Referring Provider:   Flowsheet Row Cardiac Rehab from 05/24/2016 in [facility] Cardiac and Pulmonary Rehab  Referring Provider  Glori Bickers MD      Initial Encounter Date:  Flowsheet Row Cardiac Rehab from 05/24/2016 in [facility] Cardiac and Pulmonary Rehab  Date  05/24/16  Referring Provider  Glori Bickers MD      Visit Diagnosis: Heart failure, chronic systolic ([place])  Patient's Home Medications on Admission:  Current Outpatient Prescriptions:  .  acetaminophen (TYLENOL) 500 MG tablet, Take 1,000 mg by mouth every 6 (six) hours as needed for mild pain or headache., Disp: , Rfl:  .  albuterol (PROVENTIL HFA;VENTOLIN HFA) 108 (90 BASE) MCG/ACT inhaler, Inhale 2 puffs into the lungs every 6 (six) hours as needed for wheezing or shortness of breath., Disp: , Rfl:  .  aspirin EC 81 MG tablet, Take 81 mg by mouth daily., Disp: , Rfl:  .  atorvastatin (LIPITOR) 20 MG tablet, Take 40 mg by mouth daily., Disp: , Rfl:  .  baclofen (LIORESAL) 10 MG tablet, Take 10 mg by mouth 3 (three) times daily as needed for muscle spasms., Disp: , Rfl:  .  digoxin (LANOXIN) 0.25 MG tablet, Take 1 tablet (0.25 mg total) by mouth daily., Disp: 90 tablet, Rfl: 1 .  escitalopram (LEXAPRO) 20 MG tablet, Take 1 tablet (20 mg total) by mouth daily., Disp: 30 tablet, Rfl: 0 .  insulin aspart (NOVOLOG) 100 UNIT/ML injection, Inject 10 Units into the skin 3 (three) times daily before meals., Disp: , Rfl:  .  insulin glargine (LANTUS) 100 UNIT/ML injection, Inject 40 Units into the skin at bedtime., Disp: , Rfl:  .  isosorbide mononitrate (IMDUR) 30 MG 24 hr tablet, Take 1 tablet (30 mg total) by mouth daily., Disp: 30 tablet, Rfl: 3 .  losartan (COZAAR) 25 MG tablet, Take 1 tablet (25 mg total) by mouth 2 (two) times daily., Disp: 180 tablet, Rfl: 1 .  metolazone (ZAROXOLYN) 5 MG tablet, Take 5 mg by  mouth as needed (if weight >229 lb)., Disp: , Rfl:  .  metoprolol succinate (TOPROL-XL) 100 MG 24 hr tablet, Take 100 mg by mouth daily. Take with or immediately following a meal., Disp: , Rfl:  .  milrinone (PRIMACOR) 20 MG/100 ML SOLN infusion, Inject 27.35 mcg/min into the vein continuous., Disp: 1000 mL, Rfl: 0 .  Omega-3 Fatty Acids (FISH OIL) 1000 MG CAPS, Take 1,000 mg by mouth daily., Disp: , Rfl:  .  potassium chloride SA (K-DUR,KLOR-CON) 20 MEQ tablet, Take 1 tablet (20 mEq total) by mouth 2 (two) times daily., Disp: 180 tablet, Rfl: 1 .  senna (SENOKOT) 8.6 MG TABS tablet, Take 2 tablets (17.2 mg total) by mouth at bedtime., Disp: 30 each, Rfl: 0 .  spironolactone (ALDACTONE) 25 MG tablet, Take 1 tablet (25 mg total) by mouth daily., Disp: 90 tablet, Rfl: 1 .  torsemide (DEMADEX) 20 MG tablet, Take 2 tablets (40 mg total) by mouth 2 (two) times daily., Disp: 360 tablet, Rfl: 3 .  zolpidem (AMBIEN) 5 MG tablet, Take 1 tablet (5 mg total) by mouth at bedtime as needed for sleep. (Patient not taking: Reported on 05/12/2016), Disp: 30 tablet, Rfl: 0  Past Medical History: Past Medical History:  Diagnosis Date  . AICD (automatic cardioverter/defibrillator) present   . ASCVD (arteriosclerotic cardiovascular disease)   . Chronic systolic CHF (congestive heart failure) ([place])   .  COPD (chronic obstructive pulmonary disease) ([place])   . Coronary artery disease   . Depression   . Diabetes mellitus   . History of cocaine abuse   . Hypertension   . Shortness of breath dyspnea   . Suicidal ideation   . Tobacco abuse     Tobacco Use: History  Smoking Status  . Former Smoker  . Packs/day: 1.50  . Years: 23.00  . Types: Cigarettes  . Quit date: 08/27/2015  Smokeless Tobacco  . Never Used    Labs: Recent Review Flowsheet Data    Labs for ITP Cardiac and Pulmonary Rehab Latest Ref Rng & Units 03/22/2016 03/22/2016 03/23/2016 03/24/2016 03/25/2016   Cholestrol 0 - 200 mg/dL - - - - -    LDLCALC 0 - 100 mg/dL - - - - -   HDL 40 - 60 mg/dL - - - - -   Trlycerides 0 - 200 mg/dL - - - - -   Hemoglobin A1c 4.8 - 5.6 % - - - - -   PHART 7.350 - 7.450 - - - - -   PCO2ART 35.0 - 45.0 mmHg - - - - -   HCO3 20.0 - 24.0 mEq/L 31.0(H) - - - -   TCO2 0 - 100 mmol/L 33 - - - -   O2SAT % 48.0 60.7 58.4 61.6 59.8       Exercise Target Goals:    Exercise Program Goal: Individual exercise prescription set with THRR, safety & activity barriers. Participant demonstrates ability to understand and report RPE using BORG scale, to self-measure pulse accurately, and to acknowledge the importance of the exercise prescription.  Exercise Prescription Goal: Starting with aerobic activity 30 plus minutes a day, 3 days per week for initial exercise prescription. Provide home exercise prescription and guidelines that participant acknowledges understanding prior to discharge.  Activity Barriers & Risk Stratification:     Activity Barriers & Cardiac Risk Stratification - 05/24/16 1516      Activity Barriers & Cardiac Risk Stratification   Activity Barriers Back Problems;Shortness of Breath   Cardiac Risk Stratification High      6 Minute Walk:     6 Minute Walk    Row Name 05/24/16 1343         6 Minute Walk   Phase Initial     Distance 1200 feet     Walk Time 6 minutes     # of Rest Breaks 0     MPH 2.27     METS 3.09     RPE 13     VO2 Peak 10.82     Symptoms Yes (comment)     Comments muscles in chest (intercostals) and calves sore, ankles were sore     Resting HR 81 bpm     Resting BP 122/64     Max Ex. HR 109 bpm     Max Ex. BP 126/62     2 Minute Post BP 126/64       Interval Oxygen   Interval Oxygen? Yes     Baseline Oxygen Saturation % 94 %     Baseline Liters of Oxygen 0 L  Room Air     1 Minute Oxygen Saturation % 92 %     1 Minute Liters of Oxygen 0 L     2 Minute Oxygen Saturation % 92 %     2 Minute Liters of Oxygen 0 L     3 Minute Oxygen Saturation %  93 %  3 Minute Liters of Oxygen 0 L     4 Minute Oxygen Saturation % 94 %     4 Minute Liters of Oxygen 0 L     5 Minute Oxygen Saturation % 94 %     5 Minute Liters of Oxygen 0 L     6 Minute Oxygen Saturation % 94 %     6 Minute Liters of Oxygen 0 L     2 Minute Post Oxygen Saturation % 97 %     2 Minute Post Liters of Oxygen 0 L        Initial Exercise Prescription:     Initial Exercise Prescription - 05/24/16 1300      Date of Initial Exercise RX and Referring Provider   Date 05/24/16   Referring Provider Glori Bickers MD     Treadmill   MPH 2   Grade 1   Minutes 15   METs 2.81     NuStep   Level 3  80-100 spm   Minutes 15   METs 2     Recumbant Elliptical   Level 2   RPM 50   Minutes 15   METs 2     Prescription Details   Frequency (times per week) 2   Duration Progress to 45 minutes of aerobic exercise without signs/symptoms of physical distress     Intensity   THRR 40-80% of Max Heartrate 113-145   Ratings of Perceived Exertion 11-13   Perceived Dyspnea 0-4     Progression   Progression Continue to progress workloads to maintain intensity without signs/symptoms of physical distress.     Resistance Training   Training Prescription Yes   Weight 3 lbs   Reps 10-15      Perform Capillary Blood Glucose checks as needed.  Exercise Prescription Changes:     Exercise Prescription Changes    Row Name 06/09/16 1400             Exercise Review   Progression -  From First full day of exercise 8/24         Response to Exercise   Blood Pressure (Admit) 124/60       Blood Pressure (Exercise) 126/74       Blood Pressure (Exit) 114/50       Heart Rate (Admit) 54 bpm       Heart Rate (Exercise) 109 bpm       Heart Rate (Exit) 72 bpm       Rating of Perceived Exertion (Exercise) 15       Symptoms none       Duration Progress to 45 minutes of aerobic exercise without signs/symptoms of physical distress       Intensity THRR unchanged          Progression   Progression Continue to progress workloads to maintain intensity without signs/symptoms of physical distress.       Average METs 1.92         Resistance Training   Training Prescription Yes       Weight 3 lbs       Reps 10-15         Interval Training   Interval Training No         Treadmill   MPH 1.4       Grade 0.5       Minutes 15       METs 2.17         NuStep  Level 3       Minutes 15       METs 2.2         Recumbant Elliptical   Level 2       RPM 50       Minutes 15       METs 1.4          Exercise Comments:     Exercise Comments    Row Name 05/27/16 1057 06/09/16 1448         Exercise Comments First full day of exercise!  Patient was oriented to gym and equipment including functions, settings, policies, and procedures.  Patient's individual exercise prescription and treatment plan were reviewed.  All starting workloads were established based on the results of the 6 minute walk test done at initial orientation visit.  The plan for exercise progression was also introduced and progression will be customized based on patient's performance and goals.  Ronald Miller did need to reduce workloads on the treadmill today for his first day. He also had a dizzy spell when he finished the treadmill as his blood pressure had dropped.  He was given water and recovered; he was able to finish the rest of the class without symptoms.  His blood sugar dropped during exercise as he only had cantaloupe and watermelon for breakfast.  He was given PB crackers and encouraged to eat protein for breakfast. Ronald Miller has been out with cellulitis since last vist on 8/24.         Discharge Exercise Prescription (Final Exercise Prescription Changes):     Exercise Prescription Changes - 06/09/16 1400      Exercise Review   Progression --  From First full day of exercise 8/24     Response to Exercise   Blood Pressure (Admit) 124/60   Blood Pressure (Exercise) 126/74   Blood Pressure  (Exit) 114/50   Heart Rate (Admit) 54 bpm   Heart Rate (Exercise) 109 bpm   Heart Rate (Exit) 72 bpm   Rating of Perceived Exertion (Exercise) 15   Symptoms none   Duration Progress to 45 minutes of aerobic exercise without signs/symptoms of physical distress   Intensity THRR unchanged     Progression   Progression Continue to progress workloads to maintain intensity without signs/symptoms of physical distress.   Average METs 1.92     Resistance Training   Training Prescription Yes   Weight 3 lbs   Reps 10-15     Interval Training   Interval Training No     Treadmill   MPH 1.4   Grade 0.5   Minutes 15   METs 2.17     NuStep   Level 3   Minutes 15   METs 2.2     Recumbant Elliptical   Level 2   RPM 50   Minutes 15   METs 1.4      Nutrition:  Target Goals: Understanding of nutrition guidelines, daily intake of sodium 1500mg , cholesterol 200mg , calories 30% from fat and 7% or less from saturated fats, daily to have 5 or more servings of fruits and vegetables.  Biometrics:    Nutrition Therapy Plan and Nutrition Goals:     Nutrition Therapy & Goals - 05/24/16 1521      Nutrition Therapy   Drug/Food Interactions Statins/Certain Fruits     Intervention Plan   Intervention Prescribe, educate and counsel regarding individualized specific dietary modifications aiming towards targeted core components such as weight, hypertension, lipid management, diabetes, heart  failure and other comorbidities.   Expected Outcomes Short Term Goal: Understand basic principles of dietary content, such as calories, fat, sodium, cholesterol and nutrients.;Long Term Goal: Adherence to prescribed nutrition plan.;Short Term Goal: A plan has been developed with personal nutrition goals set during dietitian appointment.      Nutrition Discharge: Rate Your Plate Scores:   Nutrition Goals Re-Evaluation:   Psychosocial: Target Goals: Acknowledge presence or absence of depression,  maximize coping skills, provide positive support system. Participant is able to verbalize types and ability to use techniques and skills needed for reducing stress and depression.  Initial Review & Psychosocial Screening:     Initial Psych Review & Screening - 05/24/16 1522      Initial Review   Current issues with History of Depression     Family Dynamics   Good Support System? Yes   Comments History of depression is listed in Ronald Miller's history. When I mentioned that he said what doctor wrote that -I don't have that.       Quality of Life Scores:     Quality of Life - 05/24/16 1856      Quality of Life Scores   Health/Function Pre 19.7 %   Socioeconomic Pre 18.43 %   Psych/Spiritual Pre 26.79 %   Family Pre 20.4 %   GLOBAL Pre 21 %      PHQ-9: Recent Review Flowsheet Data    Depression screen [facility] 2/9 05/24/2016 02/03/2016 11/06/2015   Decreased Interest 1 0 1   Down, Depressed, Hopeless 0 0 2   PHQ - 2 Score 1 0 3   Altered sleeping 2 - -   Tired, decreased energy 1 - -   Change in appetite 0 - -   Feeling bad or failure about yourself  1 - -   Trouble concentrating 0 - -   Moving slowly or fidgety/restless 2 - -   Suicidal thoughts 0 - 0   PHQ-9 Score 7 - -   Difficult doing work/chores Not difficult at all - -      Psychosocial Evaluation and Intervention:   Psychosocial Re-Evaluation:     Psychosocial Re-Evaluation    Row Name 06/01/16 [phone]             Psychosocial Re-Evaluation   Comments Ronald Miller called and said he is sorry that he is not here in Cardiac Rehab today. He is afraid they are going to put him in the hospital due to cellulitis in his legs. Added stress/           Vocational Rehabilitation: Provide vocational rehab assistance to qualifying candidates.   Vocational Rehab Evaluation & Intervention:     Vocational Rehab - 05/24/16 1309      Initial Vocational Rehab Evaluation & Intervention   Assessment shows need for Vocational  Rehabilitation No      Education: Education Goals: Education classes will be provided on a weekly basis, covering required topics. Participant will state understanding/return demonstration of topics presented.  Learning Barriers/Preferences:     Learning Barriers/Preferences - 05/24/16 1516      Learning Barriers/Preferences   Learning Barriers None   Learning Preferences None      Education Topics: General Nutrition Guidelines/Fats and Fiber: -Group instruction provided by verbal, written material, models and posters to present the general guidelines for heart healthy nutrition. Gives an explanation and review of dietary fats and fiber.   Controlling Sodium/Reading Food Labels: -Group verbal and written material supporting the discussion of sodium use  in heart healthy nutrition. Review and explanation with models, verbal and written materials for utilization of the food label.   Exercise Physiology & Risk Factors: - Group verbal and written instruction with models to review the exercise physiology of the cardiovascular system and associated critical values. Details cardiovascular disease risk factors and the goals associated with each risk factor.   Aerobic Exercise & Resistance Training: - Gives group verbal and written discussion on the health impact of inactivity. On the components of aerobic and resistive training programs and the benefits of this training and how to safely progress through these programs.   Flexibility, Balance, General Exercise Guidelines: - Provides group verbal and written instruction on the benefits of flexibility and balance training programs. Provides general exercise guidelines with specific guidelines to those with heart or lung disease. Demonstration and skill practice provided.   Stress Management: - Provides group verbal and written instruction about the health risks of elevated stress, cause of high stress, and healthy ways to reduce  stress.   Depression: - Provides group verbal and written instruction on the correlation between heart/lung disease and depressed mood, treatment options, and the stigmas associated with seeking treatment.   Anatomy & Physiology of the Heart: - Group verbal and written instruction and models provide basic cardiac anatomy and physiology, with the coronary electrical and arterial systems. Review of: AMI, Angina, Valve disease, Heart Failure, Cardiac Arrhythmia, Pacemakers, and the ICD.   Cardiac Procedures: - Group verbal and written instruction and models to describe the testing methods done to diagnose heart disease. Reviews the outcomes of the test results. Describes the treatment choices: Medical Management, Angioplasty, or Coronary Bypass Surgery.   Cardiac Medications: - Group verbal and written instruction to review commonly prescribed medications for heart disease. Reviews the medication, class of the drug, and side effects. Includes the steps to properly store meds and maintain the prescription regimen.   Go Sex-Intimacy & Heart Disease, Get SMART - Goal Setting: - Group verbal and written instruction through game format to discuss heart disease and the return to sexual intimacy. Provides group verbal and written material to discuss and apply goal setting through the application of the S.M.A.R.T. Method.   Other Matters of the Heart: - Provides group verbal, written materials and models to describe Heart Failure, Angina, Valve Disease, and Diabetes in the realm of heart disease. Includes description of the disease process and treatment options available to the cardiac patient.   Exercise & Equipment Safety: - Individual verbal instruction and demonstration of equipment use and safety with use of the equipment. Flowsheet Row Cardiac Rehab from 05/24/2016 in [facility] Cardiac and Pulmonary Rehab  Date  05/24/16  Educator  C. EnterkinRN  Instruction Review Code  1- partially meets,  needs review/practice      Infection Prevention: - Provides verbal and written material to individual with discussion of infection control including proper hand washing and proper equipment cleaning during exercise session. Flowsheet Row Cardiac Rehab from 05/24/2016 in [facility] Cardiac and Pulmonary Rehab  Date  05/24/16  Educator  C. [place]  Instruction Review Code  1- partially meets, needs review/practice      Falls Prevention: - Provides verbal and written material to individual with discussion of falls prevention and safety. Flowsheet Row Cardiac Rehab from 05/24/2016 in [facility] Cardiac and Pulmonary Rehab  Date  05/24/16  Educator  C. EnterkinRN  Instruction Review Code  2- meets goals/outcomes      Diabetes: - Individual verbal and written instruction to review  signs/symptoms of diabetes, desired ranges of glucose level fasting, after meals and with exercise. Advice that pre and post exercise glucose checks will be done for 3 sessions at entry of program. [place] from 05/24/2016 in [facility] Cardiac and Pulmonary Rehab  Date  05/24/16  Educator  C. [place]  Instruction Review Code  1- partially meets, needs review/practice       Knowledge Questionnaire Score:     Knowledge Questionnaire Score - 05/24/16 1305      Knowledge Questionnaire Score   Pre Score 24      Core Components/Risk Factors/Patient Goals at Admission:     Personal Goals and Risk Factors at Admission - 05/24/16 1521      Core Components/Risk Factors/Patient Goals on Admission    Weight Management Yes;Weight Maintenance;Weight Loss   Intervention Weight Management: Develop a combined nutrition and exercise program designed to reach desired caloric intake, while maintaining appropriate intake of nutrient and fiber, sodium and fats, and appropriate energy expenditure required for the weight goal.;Weight Management: Provide education and appropriate resources to help participant work on  and attain dietary goals.   Hypertension Yes   Intervention Provide education on lifestyle modifcations including regular physical activity/exercise, weight management, moderate sodium restriction and increased consumption of fresh fruit, vegetables, and low fat dairy, alcohol moderation, and smoking cessation.;Monitor prescription use compliance.   Expected Outcomes Short Term: Continued assessment and intervention until BP is < 140/85mm HG in hypertensive participants. < 130/57mm HG in hypertensive participants with diabetes, heart failure or chronic kidney disease.;Long Term: Maintenance of blood pressure at goal levels.      Core Components/Risk Factors/Patient Goals Review:      Goals and Risk Factor Review    Row Name 06/01/16 [phone]             Core Components/Risk Factors/Patient Goals Review   Review Ronald Miller called and said he is sorry that he is not here in Cardiac Rehab today. He is afraid they are going to put him in the hospital due to cellulitis in his legs.           Core Components/Risk Factors/Patient Goals at Discharge (Final Review):      Goals and Risk Factor Review - 06/01/16 0947      Core Components/Risk Factors/Patient Goals Review   Review Ronald Miller called and said he is sorry that he is not here in Cardiac Rehab today. He is afraid they are going to put him in the hospital due to cellulitis in his legs.       ITP Comments:     ITP Comments    Row Name 05/24/16 1519 05/24/16 1523 06/01/16 0942 06/16/16 0644     ITP Comments "Ronald Miller" is interested in getting his mother to exercise with him since she has [employer], government. A Cardiac Rehab REgistered dietician appt was made for Ronald Miller since he said he has problems what to eat with his diabetes and now heart problems with a 2 liter fluid restriction/day. History of depression is listed in Ronald Miller's history. When I mentioned that he said what doctor wrote that -I don't have that.  Ronald Miller called and said he is sorry that he is  not here in Cardiac Rehab today. He is afraid they are going to put him in the hospital due to cellulitis in his legs.  30 day review. Continue with ITP unless changes noted by Medical Director at signature of review.       Comments:

## 2016-06-17 ENCOUNTER — Telehealth (HOSPITAL_COMMUNITY): Payer: Self-pay | Admitting: Surgery

## 2016-06-17 ENCOUNTER — Encounter: Payer: Medicare HMO | Admitting: *Deleted

## 2016-06-17 DIAGNOSIS — I5022 Chronic systolic (congestive) heart failure: Secondary | ICD-10-CM | POA: Diagnosis present

## 2016-06-17 DIAGNOSIS — Z87891 Personal history of nicotine dependence: Secondary | ICD-10-CM | POA: Diagnosis not present

## 2016-06-17 DIAGNOSIS — Z95810 Presence of automatic (implantable) cardiac defibrillator: Secondary | ICD-10-CM | POA: Diagnosis not present

## 2016-06-17 LAB — GLUCOSE, CAPILLARY: Glucose-Capillary: 253 mg/dL — ABNORMAL HIGH (ref 65–99)

## 2016-06-17 MED ORDER — DOXYCYCLINE HYCLATE 50 MG PO CAPS: 100.0000 mg | ORAL_CAPSULE | Freq: Two times a day (BID) | ORAL | 0 refills | Status: AC

## 2016-06-17 NOTE — Telephone Encounter (Signed)
Mr Steenbergen called to let us know that he has been feeling "bad"-more tired than usual.  He says that he has a cough with some green phlegm and feels like he may pass out at times associated with cough.  The home care nurse was there and says his BP is 85/55 currently which is consistent with previous readings.  His weight today is 220 lbs.  I told him I would check with provider and return his call.

## 2016-06-17 NOTE — Progress Notes (Signed)
Incomplete Session Note  Patient Details  Name: Ronald Miller. MRN: [id number] Date of Birth: 1956/01/02 Referring Provider:   Flowsheet Row Cardiac Rehab from 05/24/2016 in [facility] Cardiac and Pulmonary Rehab  Referring Provider  Glori Bickers MD      Gunnar Fusi. did not complete his rehab session.  He was sent home early after coughing up green phlegm.  He is going to try the 4pm class on Monday.

## 2016-06-17 NOTE — Telephone Encounter (Signed)
Per Dr Renard Hamper. Ronald Miller was instructed to take Doxycycline 100 mg twice daily for 1 week for cough and green sputum.   I also instructed him to call back with any weigh gain or swelling and/or go to ED with SOB or worsening symptoms this weekend.  He is aware and agreeable.

## 2016-06-18 ENCOUNTER — Other Ambulatory Visit
Admission: RE | Admit: 2016-06-18 | Discharge: 2016-06-18 | Disposition: A | Discharge status: Home / Self Care | Payer: Medicare HMO | Source: Ambulatory Visit | Attending: Internal Medicine | Admitting: Internal Medicine

## 2016-06-18 DIAGNOSIS — I5022 Chronic systolic (congestive) heart failure: Secondary | ICD-10-CM | POA: Diagnosis present

## 2016-06-18 LAB — COMPREHENSIVE METABOLIC PANEL
ALK PHOS: 76 U/L (ref 38–126)
ALT: 18 U/L (ref 17–63)
AST: 22 U/L (ref 15–41)
Albumin: 4.1 g/dL (ref 3.5–5.0)
Anion gap: 8 (ref 5–15)
BUN: 50 mg/dL — ABNORMAL HIGH (ref 6–20)
CALCIUM: 9.1 mg/dL (ref 8.9–10.3)
CHLORIDE: 92 mmol/L — AB (ref 101–111)
CO2: 33 mmol/L — ABNORMAL HIGH (ref 22–32)
CREATININE: 1.47 mg/dL — AB (ref 0.61–1.24)
GFR, EST AFRICAN AMERICAN: 59 mL/min — AB (ref >60)
GFR, EST NON AFRICAN AMERICAN: 50 mL/min — AB (ref >60)
Glucose, Bld: 241 mg/dL — ABNORMAL HIGH (ref 65–99)
Potassium: 4.1 mmol/L (ref 3.5–5.1)
SODIUM: 133 mmol/L — AB (ref 135–145)
Total Bilirubin: 1.1 mg/dL (ref 0.3–1.2)
Total Protein: 7 g/dL (ref 6.5–8.1)

## 2016-06-18 LAB — CBC WITH DIFFERENTIAL/PLATELET
Basophils Absolute: 0 10*3/uL (ref 0–0.1)
Basophils Relative: 0 %
Eosinophils Absolute: 0.1 10*3/uL (ref 0–0.7)
Eosinophils Relative: 2 %
HEMATOCRIT: 34.5 % — AB (ref 40.0–52.0)
HEMOGLOBIN: 11.9 g/dL — AB (ref 13.0–18.0)
LYMPHS ABS: 1.2 10*3/uL (ref 1.0–3.6)
LYMPHS PCT: 15 %
MCH: 31.6 pg (ref 26.0–34.0)
MCHC: 34.5 g/dL (ref 32.0–36.0)
MCV: 91.7 fL (ref 80.0–100.0)
MONOS PCT: 8 %
Monocytes Absolute: 0.6 10*3/uL (ref 0.2–1.0)
NEUTROS ABS: 5.5 10*3/uL (ref 1.4–6.5)
NEUTROS PCT: 75 %
Platelets: 114 10*3/uL — ABNORMAL LOW (ref 150–440)
RBC: 3.76 MIL/uL — ABNORMAL LOW (ref 4.40–5.90)
RDW: 14.7 % — ABNORMAL HIGH (ref 11.5–14.5)
WBC: 7.4 10*3/uL (ref 3.8–10.6)

## 2016-06-18 LAB — PROTIME-INR
INR: 1.05
Prothrombin Time: 13.7 seconds (ref 11.4–15.2)

## 2016-06-18 LAB — MAGNESIUM: Magnesium: 1.8 mg/dL (ref 1.7–2.4)

## 2016-06-20 ENCOUNTER — Emergency Department (HOSPITAL_COMMUNITY): Payer: Medicare HMO

## 2016-06-20 ENCOUNTER — Emergency Department (HOSPITAL_COMMUNITY)
Admission: EM | Admit: 2016-06-20 | Discharge: 2016-06-20 | Disposition: A | Discharge status: Home / Self Care | Payer: Medicare HMO | Attending: Emergency Medicine | Admitting: Emergency Medicine

## 2016-06-20 ENCOUNTER — Encounter (HOSPITAL_COMMUNITY): Payer: Self-pay

## 2016-06-20 DIAGNOSIS — Z951 Presence of aortocoronary bypass graft: Secondary | ICD-10-CM | POA: Insufficient documentation

## 2016-06-20 DIAGNOSIS — E119 Type 2 diabetes mellitus without complications: Secondary | ICD-10-CM | POA: Insufficient documentation

## 2016-06-20 DIAGNOSIS — L258 Unspecified contact dermatitis due to other agents: Secondary | ICD-10-CM | POA: Insufficient documentation

## 2016-06-20 DIAGNOSIS — I2510 Atherosclerotic heart disease of native coronary artery without angina pectoris: Secondary | ICD-10-CM | POA: Insufficient documentation

## 2016-06-20 DIAGNOSIS — Z794 Long term (current) use of insulin: Secondary | ICD-10-CM | POA: Insufficient documentation

## 2016-06-20 DIAGNOSIS — I110 Hypertensive heart disease with heart failure: Secondary | ICD-10-CM | POA: Diagnosis not present

## 2016-06-20 DIAGNOSIS — Z87891 Personal history of nicotine dependence: Secondary | ICD-10-CM | POA: Diagnosis not present

## 2016-06-20 DIAGNOSIS — Z7982 Long term (current) use of aspirin: Secondary | ICD-10-CM | POA: Insufficient documentation

## 2016-06-20 DIAGNOSIS — L259 Unspecified contact dermatitis, unspecified cause: Secondary | ICD-10-CM

## 2016-06-20 DIAGNOSIS — J449 Chronic obstructive pulmonary disease, unspecified: Secondary | ICD-10-CM | POA: Diagnosis not present

## 2016-06-20 DIAGNOSIS — I5022 Chronic systolic (congestive) heart failure: Secondary | ICD-10-CM | POA: Diagnosis not present

## 2016-06-20 DIAGNOSIS — Z95810 Presence of automatic (implantable) cardiac defibrillator: Secondary | ICD-10-CM | POA: Diagnosis not present

## 2016-06-20 DIAGNOSIS — Z452 Encounter for adjustment and management of vascular access device: Secondary | ICD-10-CM

## 2016-06-20 DIAGNOSIS — R21 Rash and other nonspecific skin eruption: Secondary | ICD-10-CM | POA: Diagnosis present

## 2016-06-20 MED ORDER — SODIUM CHLORIDE 0.9% FLUSH: 10.0000 mL | INTRAVENOUS | Status: DC | PRN

## 2016-06-20 NOTE — ED Notes (Signed)
PT has PICC to RT upper arm . Pt requesting  dsy change .

## 2016-06-20 NOTE — ED Provider Notes (Signed)
Patient moved to Pod D.  Presenting complaint today was for rash around the PICC line, which was determined to be contact dermatitis from tape exposure by prior provider.  It was subsequently discovered that the PICC line had been dislodged 10 cm.  Patient had PICC line replaced in FT, but will be unable to get his milrinone until tomorrow.  Cardiology (Dr. Tempie Hoist) will need to be consulted on whether patient can be DC'd without milrinone, or whether patient will need to be observed overnight in the hospital.  Patient signed out to Pod B/D Dr. Sabra Heck, who will follow up on CXR to confirm placement of PICC and address the medication issue with cardiology.     Montine Circle, PA-C 06/20/16 1753    Noemi Chapel, MD 06/23/16 [phone]

## 2016-06-20 NOTE — Progress Notes (Signed)
Peripherally Inserted Central Catheter/Midline Placement  The IV Nurse has discussed with the patient and/or persons authorized to consent for the patient, the purpose of this procedure and the potential benefits and risks involved with this procedure.  The benefits include less needle sticks, lab draws from the catheter, and the patient may be discharged home with the catheter. Risks include, but not limited to, infection, bleeding, blood clot (thrombus formation), and puncture of an artery; nerve damage and irregular heartbeat and possibility to perform a PICC exchange if needed/ordered by physician.  Alternatives to this procedure were also discussed.  Bard Power PICC patient education guide, fact sheet on infection prevention and patient information card has been provided to patient /or left at bedside.  Procedure specifically for exchange due to current PICC out 10 cm.   PICC/Midline Placement Documentation        Alyssa Rotondo, Nicolette Bang 06/20/2016, 5:50 PM

## 2016-06-20 NOTE — Discharge Instructions (Signed)
Please call your cardiologist - recheck in the next week.

## 2016-06-20 NOTE — ED Notes (Signed)
IV team in PT room for change of [facility] Line.

## 2016-06-20 NOTE — ED Provider Notes (Signed)
[facility] DEPT Provider Note   CSN: [id number] Arrival date & time: 06/20/16  1426  By signing my name below, I, Royce Macadamia, attest that this documentation has been prepared under the direction and in the presence of  [facility], PA-C. Electronically Signed: Royce Macadamia, ED Scribe. 06/20/16. 3:50 PM.  History   Chief Complaint Chief Complaint  Patient presents with  . Rash   The history is provided by the patient and medical records. No language interpreter was used.   HPI Comments:  Nilton Fischetti. is a 60 y.o. male with multiple medical problems presents to the Emergency Department complaining of a red itchy rash on his right upper arm under the tape around his PICC line.  He denies fever, chills, discharge from the site, and pain.     Past Medical History:  Diagnosis Date  . AICD (automatic cardioverter/defibrillator) present   . ASCVD (arteriosclerotic cardiovascular disease)   . Chronic systolic CHF (congestive heart failure) ([place])   . COPD (chronic obstructive pulmonary disease) ([place])   . Coronary artery disease   . Depression   . Diabetes mellitus   . History of cocaine abuse   . Hypertension   . Shortness of breath dyspnea   . Suicidal ideation   . Tobacco abuse     Patient Active Problem List   Diagnosis Date Noted  . DNR (do not resuscitate) discussion   . Palliative care encounter   . Cardiogenic shock ([place])   . Pain in the chest   . Dyslipidemia associated with type 2 diabetes mellitus ([place])   . Hyponatremia   . Emesis   . Atypical chest pain 03/19/2016  . Ascites 03/10/2016  . ICD (implantable cardioverter-defibrillator) in place 03/09/2016  . CHF (congestive heart failure) ([name]) 02/23/2016  . Chronic systolic heart failure ([place]) 02/04/2016  . Insomnia 02/04/2016  . Pressure ulcer 10/15/2015  . Fluid overload 10/14/2015  . Chronic cholecystitis with calculus   . COPD (chronic obstructive pulmonary disease) ([place]) 08/28/2015  .  HTN (hypertension) 08/28/2015  . Depression 08/28/2015  . Elevated troponin 08/28/2015  . AICD discharge 08/28/2015  . Hypokalemia 08/28/2015  . Hypotension 08/28/2015  . Arteriosclerosis of coronary artery 03/05/2014  . Diabetes mellitus ([place]) 03/05/2014  . HLD (hyperlipidemia) 03/05/2014  . Type 2 diabetes mellitus ([place]) 08/06/2009  . Cardiovascular disease 08/06/2009  . LOW BACK PAIN, CHRONIC 08/06/2009    Past Surgical History:  Procedure Laterality Date  . CARDIAC CATHETERIZATION N/A 02/12/2016   Procedure: Right/Left Heart Cath and Coronary/Graft Angiography;  Surgeon: Jolaine Artist, MD;  Location: [facility] CV LAB;  Service: Cardiovascular;  Laterality: N/A;  . CARDIAC CATHETERIZATION N/A 03/22/2016   Procedure: Right Heart Cath;  Surgeon: Jolaine Artist, MD;  Location: [place] CV LAB;  Service: Cardiovascular;  Laterality: N/A;  . CARDIAC DEFIBRILLATOR PLACEMENT    . CARDIAC DEFIBRILLATOR PLACEMENT    . CHOLECYSTECTOMY N/A 08/30/2015   Procedure: LAPAROSCOPIC CHOLECYSTECTOMY;  Surgeon: Hubbard Robinson, MD;  Location: [name];  Service: General;  Laterality: N/A;  . CORONARY ARTERY BYPASS GRAFT  2001  . INSERT / REPLACE / REMOVE PACEMAKER    . LUMBAR DISC SURGERY  02/1999   Discectomy and fusion  . VASECTOMY     Subsequent reversal       Home Medications    Prior to Admission medications   Medication Sig Start Date End Date Taking? Authorizing Provider  acetaminophen (TYLENOL) 500 MG tablet Take 1,000 mg by mouth every  6 (six) hours as needed for mild pain or headache.    Historical Provider, MD  albuterol (PROVENTIL HFA;VENTOLIN HFA) 108 (90 BASE) MCG/ACT inhaler Inhale 2 puffs into the lungs every 6 (six) hours as needed for wheezing or shortness of breath.    Historical Provider, MD  aspirin EC 81 MG tablet Take 81 mg by mouth daily.    Historical Provider, MD  atorvastatin (LIPITOR) 20 MG tablet Take 40 mg by mouth daily.    Historical Provider,  MD  baclofen (LIORESAL) 10 MG tablet Take 10 mg by mouth 3 (three) times daily as needed for muscle spasms.    Historical Provider, MD  digoxin (LANOXIN) 0.25 MG tablet Take 1 tablet (0.25 mg total) by mouth daily. 02/19/16   Jolaine Artist, MD  doxycycline (VIBRAMYCIN) 50 MG capsule Take 2 capsules (100 mg total) by mouth 2 (two) times daily. 06/17/16 06/24/16  Shaune Pascal Bensimhon, MD  escitalopram (LEXAPRO) 20 MG tablet Take 1 tablet (20 mg total) by mouth daily. 09/02/15   Max Sane, MD  insulin aspart (NOVOLOG) 100 UNIT/ML injection Inject 10 Units into the skin 3 (three) times daily before meals.    Historical Provider, MD  insulin glargine (LANTUS) 100 UNIT/ML injection Inject 40 Units into the skin at bedtime.    Historical Provider, MD  isosorbide mononitrate (IMDUR) 30 MG 24 hr tablet Take 1 tablet (30 mg total) by mouth daily. 04/15/16   Larey Dresser, MD  losartan (COZAAR) 25 MG tablet Take 1 tablet (25 mg total) by mouth 2 (two) times daily. 02/19/16   Jolaine Artist, MD  metolazone (ZAROXOLYN) 5 MG tablet Take 5 mg by mouth as needed (if weight >229 lb).    Historical Provider, MD  metoprolol succinate (TOPROL-XL) 100 MG 24 hr tablet Take 100 mg by mouth daily. Take with or immediately following a meal.    Historical Provider, MD  milrinone (PRIMACOR) 20 MG/100 ML SOLN infusion Inject 27.35 mcg/min into the vein continuous. 03/25/16   Robbie Lis, MD  Omega-3 Fatty Acids (FISH OIL) 1000 MG CAPS Take 1,000 mg by mouth daily.    Historical Provider, MD  potassium chloride SA (K-DUR,KLOR-CON) 20 MEQ tablet Take 1 tablet (20 mEq total) by mouth 2 (two) times daily. 02/19/16   Jolaine Artist, MD  senna (SENOKOT) 8.6 MG TABS tablet Take 2 tablets (17.2 mg total) by mouth at bedtime. 03/25/16   Robbie Lis, MD  spironolactone (ALDACTONE) 25 MG tablet Take 1 tablet (25 mg total) by mouth daily. 03/10/16   Amy D Clegg, NP  torsemide (DEMADEX) 20 MG tablet Take 2 tablets (40 mg total) by  mouth 2 (two) times daily. 06/08/16   Jolaine Artist, MD  zolpidem (AMBIEN) 5 MG tablet Take 1 tablet (5 mg total) by mouth at bedtime as needed for sleep. Patient not taking: Reported on 05/12/2016 01/18/16   Lytle Butte, MD    Family History Family History  Problem Relation Age of Onset  . CAD Father   . Diabetes Father   . Heart failure Father     Social History Social History  Substance Use Topics  . Smoking status: Former Smoker    Packs/day: 1.50    Years: 23.00    Types: Cigarettes    Quit date: 08/27/2015  . Smokeless tobacco: Never Used  . Alcohol use No     Allergies   Codeine   Review of Systems Review of Systems  Constitutional: Negative  for chills and fever.  Musculoskeletal: Negative for joint swelling and myalgias.  Skin: Positive for color change and rash.  Neurological: Negative for weakness and numbness.  Psychiatric/Behavioral: Negative for self-injury.     Physical Exam Updated Vital Signs BP 96/66 (BP Location: Left Arm)   Pulse 78   Temp 97.4 F (36.3 C) (Oral)   Resp 18   Ht 5\' 9"  (1.753 m)   Wt 100.2 kg   SpO2 93%   BMI 32.64 kg/m   Physical Exam  Constitutional: He appears well-developed and well-nourished. No distress.  HENT:  Head: Normocephalic and atraumatic.  Neck: Neck supple.  Pulmonary/Chest: Effort normal.  Neurological: He is alert.  Skin: He is not diaphoretic.  Right upper arm with picc line in place.  Slight erythematous rash underling taped area.    Nursing note and vitals reviewed.    ED Treatments / Results  DIAGNOSTIC STUDIES:  Oxygen Saturation is 93% on RA, NML by my interpretation.    COORDINATION OF CARE:  3:50 PM Discussed treatment plan with pt at bedside and pt agreed to plan.Labs (all labs ordered are listed, but only abnormal results are displayed) Labs Reviewed - No data to display  EKG  EKG Interpretation None       Radiology No results found.  Procedures Procedures (including  critical care time)  Medications Ordered in ED Medications - No data to display   Initial Impression / Assessment and Plan / ED Course  I have reviewed the triage vital signs and the nursing notes.  Pertinent labs & imaging results that were available during my care of the patient were reviewed by me and considered in my medical decision making (see chart for details).  Clinical Course   Afebrile nontoxic patient with PICC line in place who gets home health, came to ED requesting dressing change secondary to itching and rash underneath the tape.  Pt is otherwise feeling well, at his baseline, no complaints.  Given that this is a PICC line, IV team has been called to assess it.  Pt signed out to [employer], PA-C, at change of shift pending this assessment and dressing change.   Final Clinical Impressions(s) / ED Diagnoses   Final diagnoses:  Contact dermatitis    New Prescriptions New Prescriptions   No medications on file    I personally performed the services described in this documentation, which was scribed in my presence. The recorded information has been reviewed and is accurate.    Clayton Bibles, PA-C 06/20/16 [place], MD 06/20/16 [phone]

## 2016-06-20 NOTE — ED Triage Notes (Signed)
Per Pt, Pt is coming to have dressing changed for his PICC Line. Pt reports that he is getting [place]. Denies any symptoms or changes in medications.

## 2016-06-20 NOTE — ED Notes (Signed)
Patient came in with c/o itching and rash on right arm located where he has a PICC line. Upon assessment, RN noted patient's PICC line was sticking out about 5 inches. He states his PICC line has been out that far for the last 3 months when they inserted it. Patient has no complaints of pain. Denies fevers, chills, sweating, or nausea. He has PICC line for his advanced heart failure medication, Milrinone drip. He states he feels fine. Currently, PICC line was reinserted by IV team and milrinone drip is stopped d/t no having the proper tubing to restart drip. RN to monitor patient. Cardiology will see patient. BP is low at patient's baseline around 85/60. Lung sounds diminished. HR 70s. 99% on RA. RR 18.

## 2016-06-20 NOTE — ED Notes (Signed)
Verified drip is running.

## 2016-06-20 NOTE — ED Notes (Signed)
Spoke to [place] nurse who is coming to the ED to change milrinone tubing and restart pump. MD aware. ETA 30 minutes.

## 2016-06-20 NOTE — ED Notes (Signed)
EDP at bedside  

## 2016-06-20 NOTE — ED Triage Notes (Addendum)
PT presents today with PICC Line problems. Pt is receiving milrione  continuous IV infusion.

## 2016-06-20 NOTE — Progress Notes (Signed)
Patient assessed for PICC issue.  PICC currently out 10 cm and will need to be exchanged.  Spoke with Rob, [place] who will put order in.  Carolee Rota, RN VAST

## 2016-06-22 ENCOUNTER — Telehealth (HOSPITAL_COMMUNITY): Payer: Self-pay | Admitting: Surgery

## 2016-06-22 NOTE — Telephone Encounter (Signed)
I called to check on Ronald Miller to follow-up from his phone call to me on Friday.  He tells me that he is still feeling poorly--he is "very sleepy and has no energy".  He does say that he no longer has the "green sputum"--yet still has cough.  He says that weight today is 226.5 lbs which is 5.5 lbs up from Sunday's visit to ED to have PICC Line evaluated.  He has an appt in the AHF clinic tomorrow.

## 2016-06-23 ENCOUNTER — Ambulatory Visit (HOSPITAL_COMMUNITY)
Admission: RE | Admit: 2016-06-23 | Discharge: 2016-06-23 | Disposition: A | Discharge status: Home / Self Care | Payer: Medicare HMO | Source: Ambulatory Visit | Attending: Internal Medicine | Admitting: Internal Medicine

## 2016-06-23 VITALS — BP 94/62 | HR 86 | Wt 223.2 lb

## 2016-06-23 DIAGNOSIS — I429 Cardiomyopathy, unspecified: Secondary | ICD-10-CM | POA: Insufficient documentation

## 2016-06-23 DIAGNOSIS — J449 Chronic obstructive pulmonary disease, unspecified: Secondary | ICD-10-CM | POA: Insufficient documentation

## 2016-06-23 DIAGNOSIS — Z951 Presence of aortocoronary bypass graft: Secondary | ICD-10-CM | POA: Insufficient documentation

## 2016-06-23 DIAGNOSIS — Z888 Allergy status to other drugs, medicaments and biological substances status: Secondary | ICD-10-CM | POA: Insufficient documentation

## 2016-06-23 DIAGNOSIS — I2510 Atherosclerotic heart disease of native coronary artery without angina pectoris: Secondary | ICD-10-CM | POA: Insufficient documentation

## 2016-06-23 DIAGNOSIS — K760 Fatty (change of) liver, not elsewhere classified: Secondary | ICD-10-CM | POA: Insufficient documentation

## 2016-06-23 DIAGNOSIS — I5022 Chronic systolic (congestive) heart failure: Secondary | ICD-10-CM | POA: Diagnosis not present

## 2016-06-23 DIAGNOSIS — Z794 Long term (current) use of insulin: Secondary | ICD-10-CM | POA: Diagnosis not present

## 2016-06-23 DIAGNOSIS — Z79899 Other long term (current) drug therapy: Secondary | ICD-10-CM | POA: Diagnosis not present

## 2016-06-23 DIAGNOSIS — F329 Major depressive disorder, single episode, unspecified: Secondary | ICD-10-CM | POA: Diagnosis not present

## 2016-06-23 DIAGNOSIS — Z7982 Long term (current) use of aspirin: Secondary | ICD-10-CM | POA: Insufficient documentation

## 2016-06-23 DIAGNOSIS — I110 Hypertensive heart disease with heart failure: Secondary | ICD-10-CM | POA: Diagnosis not present

## 2016-06-23 DIAGNOSIS — E119 Type 2 diabetes mellitus without complications: Secondary | ICD-10-CM | POA: Insufficient documentation

## 2016-06-23 DIAGNOSIS — F1410 Cocaine abuse, uncomplicated: Secondary | ICD-10-CM | POA: Diagnosis not present

## 2016-06-23 DIAGNOSIS — Z87891 Personal history of nicotine dependence: Secondary | ICD-10-CM | POA: Insufficient documentation

## 2016-06-23 DIAGNOSIS — R188 Other ascites: Secondary | ICD-10-CM | POA: Insufficient documentation

## 2016-06-23 DIAGNOSIS — Z8249 Family history of ischemic heart disease and other diseases of the circulatory system: Secondary | ICD-10-CM | POA: Diagnosis not present

## 2016-06-23 DIAGNOSIS — Z95810 Presence of automatic (implantable) cardiac defibrillator: Secondary | ICD-10-CM | POA: Insufficient documentation

## 2016-06-23 LAB — BASIC METABOLIC PANEL
ANION GAP: 8 (ref 5–15)
BUN: 33 mg/dL — ABNORMAL HIGH (ref 6–20)
CALCIUM: 9.8 mg/dL (ref 8.9–10.3)
CO2: 31 mmol/L (ref 22–32)
CREATININE: 1.14 mg/dL (ref 0.61–1.24)
Chloride: 99 mmol/L — ABNORMAL LOW (ref 101–111)
GFR calc non Af Amer: >60 mL/min (ref >60)
Glucose, Bld: 128 mg/dL — ABNORMAL HIGH (ref 65–99)
Potassium: 4.1 mmol/L (ref 3.5–5.1)
SODIUM: 138 mmol/L (ref 135–145)

## 2016-06-23 LAB — CBC
HCT: 37 % — ABNORMAL LOW (ref 39.0–52.0)
HEMOGLOBIN: 12.1 g/dL — AB (ref 13.0–17.0)
MCH: 30.6 pg (ref 26.0–34.0)
MCHC: 32.7 g/dL (ref 30.0–36.0)
MCV: 93.7 fL (ref 78.0–100.0)
PLATELETS: 142 10*3/uL — AB (ref 150–400)
RBC: 3.95 MIL/uL — AB (ref 4.22–5.81)
RDW: 14.4 % (ref 11.5–15.5)
WBC: 8.2 10*3/uL (ref 4.0–10.5)

## 2016-06-23 LAB — CARBOXYHEMOGLOBIN
CARBOXYHEMOGLOBIN: 3.8 % — AB (ref 0.5–1.5)
METHEMOGLOBIN: 1 % (ref 0.0–1.5)
O2 SAT: 55.4 %
TOTAL HEMOGLOBIN: 12.5 g/dL (ref 12.0–16.0)

## 2016-06-23 NOTE — Addendum Note (Signed)
Encounter addended by: Scarlette Calico, RN on: 06/23/2016 12:54 PM<BR>    Actions taken: Sign clinical note

## 2016-06-23 NOTE — Addendum Note (Signed)
Encounter addended by: Scarlette Calico, RN on: 06/23/2016 12:58 PM<BR>    Actions taken: Order Entry activity accessed, Diagnosis association updated

## 2016-06-23 NOTE — Addendum Note (Signed)
Encounter addended by: Scarlette Calico, RN on: 06/23/2016 12:53 PM<BR>    Actions taken: Order Entry activity accessed, Diagnosis association updated

## 2016-06-23 NOTE — Progress Notes (Signed)
Patient ID: Ronald Miller., male   DOB: 10-Apr-1956, 60 y.o.   MRN: [id number]    Advanced Heart Failure Clinic Note   Referring Provider: Darylene Price, FNP Primary Care: [facility] Primary Cardiologist: Dr Haroldine Laws.   HPI:  Ronald Abraha. is a 60 y.o. male with history of chronic systolic HF s/p Medtronic ICD 2014, CAD s/p CABG in 2000, HTN, Hx of cocaine abuse, Tobacco abuse, Depression, and COPD.   Echo 10/14/15 LVEF 20-25%, RV mildly dilated.   Admitted to [facility] from 4/24 - [id number] with A/C systolic HF.  Had a US guided Paracentesis with 5 L removed. They decreased his home lasix to 40 mg daily due to low blood pressure. BB held as well with bradycardia and hypotension.  Discharge weight 239 lbs  Admitted 5/17 with volume overload and CP. Echo 02/10/16 LVEF 15%, Mild MR, Mod LAE, Severe RAE, severely reduced RV function, PA peak pressure 31 mm Hg. Underwent paracentesis 02/10/16 with 5.2 L out. Ultrasound showed severe fatty liver. Had [facility] yesterday with severe native 3v CAD, All grafts occluded except LIMA to LAD with faint L to R collaterals, elevated filling pressures with normal cardiac output. No targets for revascularization. Weight on d/c 247 -> 219 pounds.   Admitted 5/22 through 5/24 with chest pain and dyspnea. Diuresed with IV lasix and transitioned to torsemide 40 /20 daily. Discharge weight 228 pounds.   Admitted 6/16-> 6/22 with HF exacerbation.  Cath done 03/22/16 with low cardiac output (CI 1.8) and biventricular failure. Started on milrinone drip and sent home on 0.25 mcg/kg/min.  Diuresed 14 lbs. D/c weight 227 lbs.   Echo 8/17: LVEF 20% RV moderate to severe HK   He presents for regular follow up. Remains on milrinone 0.25 mcg/kg/min. Recently saw Dr. Hoyt Koch at [facility]. Starting transplant. Was going to CR but now on hold due to bronchitis. Finishing up doxy. Weight is stable bt feels more rundown. Less energy. No CP, orthopnea or PND. Dyspnea on mild  to moderate exertion.   RHC 03/22/16 RA = 23 RV = 46/25 PA = 49/28 (36) PCW = 27 Fick cardiac output/index = 4.9/2.2 Thermo CO/Ci = 4.1/1.8 PVR = 2.2 WU Ao sat = 89% PA sat = 49%, 53% RA/PCWP = 0.85  R/LHC 02/12/16  Prox RCA lesion, 100% stenosed.  Prox Cx to Dist Cx lesion, 95% stenosed.  1st Mrg lesion, 100% stenosed.  2nd Mrg lesion, 100% stenosed.  Prox LAD lesion, 100% stenosed.  Ost LM to LM lesion, 50% stenosed.  Origin lesion, 100% stenosed.  Origin lesion, 100% stenosed.  Origin lesion, 100% stenosed.  Findings: Ao = 82/58 (69) LV = 84/15/27 RA = 14 RV = 45/11/17 PA = 49/24 (31) PCW = 28 Fick cardiac output/index = 5.3/2.4 PVR = 0.6 WU SVR = 837 FA sat = 98% PA sat = 63%, 62%  02/25/2016: K 3.5 Creatinine 1.03  03/10/2016: K 3.7 Creatinine 1.12   Past Medical History:  Diagnosis Date  . AICD (automatic cardioverter/defibrillator) present   . ASCVD (arteriosclerotic cardiovascular disease)   . Chronic systolic CHF (congestive heart failure) ([place])   . COPD (chronic obstructive pulmonary disease) ([place])   . Coronary artery disease   . Depression   . Diabetes mellitus   . History of cocaine abuse   . Hypertension   . Shortness of breath dyspnea   . Suicidal ideation   . Tobacco abuse     Current Outpatient Prescriptions  Medication Sig Dispense  Refill  . acetaminophen (TYLENOL) 500 MG tablet Take 1,000 mg by mouth every 6 (six) hours as needed for mild pain or headache.    . albuterol (PROVENTIL HFA;VENTOLIN HFA) 108 (90 BASE) MCG/ACT inhaler Inhale 2 puffs into the lungs every 6 (six) hours as needed for wheezing or shortness of breath.    Marland Kitchen aspirin EC 81 MG tablet Take 81 mg by mouth every morning.     Marland Kitchen atorvastatin (LIPITOR) 20 MG tablet Take 20 mg by mouth daily.     . baclofen (LIORESAL) 10 MG tablet Take 10 mg by mouth 3 (three) times daily as needed for muscle spasms.    . digoxin (LANOXIN) 0.25 MG tablet Take 1 tablet (0.25 mg total)  by mouth daily. 90 tablet 1  . doxycycline (VIBRAMYCIN) 50 MG capsule Take 2 capsules (100 mg total) by mouth 2 (two) times daily. 28 capsule 0  . escitalopram (LEXAPRO) 20 MG tablet Take 1 tablet (20 mg total) by mouth daily. 30 tablet 0  . insulin aspart (NOVOLOG) 100 UNIT/ML injection Inject 10 Units into the skin 3 (three) times daily before meals.    . insulin glargine (LANTUS) 100 UNIT/ML injection Inject 40 Units into the skin at bedtime.    . isosorbide mononitrate (IMDUR) 30 MG 24 hr tablet Take 1 tablet (30 mg total) by mouth daily. 30 tablet 3  . losartan (COZAAR) 25 MG tablet Take 1 tablet (25 mg total) by mouth 2 (two) times daily. 180 tablet 1  . metolazone (ZAROXOLYN) 5 MG tablet Take 5 mg by mouth as needed (if weight >229 lb).    . metoprolol succinate (TOPROL-XL) 100 MG 24 hr tablet Take 100 mg by mouth daily. Take with or immediately following a meal.    . milrinone (PRIMACOR) 20 MG/100 ML SOLN infusion Inject 27.35 mcg/min into the vein continuous. (Patient taking differently: Inject 0.25 mcg/kg/min into the vein continuous. 1.51 per hour) 1000 mL 0  . Multiple Vitamins-Minerals (SENTRY ADULT) TABS Take 1 tablet by mouth daily.    . Omega-3 Fatty Acids (FISH OIL) 1000 MG CAPS Take 1,000 mg by mouth daily.    . potassium chloride SA (K-DUR,KLOR-CON) 20 MEQ tablet Take 1 tablet (20 mEq total) by mouth 2 (two) times daily. 180 tablet 1  . senna (SENOKOT) 8.6 MG TABS tablet Take 2 tablets (17.2 mg total) by mouth at bedtime. (Patient taking differently: Take 2 tablets by mouth at bedtime as needed for mild constipation. ) 30 each 0  . spironolactone (ALDACTONE) 25 MG tablet Take 1 tablet (25 mg total) by mouth daily. 90 tablet 1  . torsemide (DEMADEX) 20 MG tablet Take 2 tablets (40 mg total) by mouth 2 (two) times daily. 360 tablet 3  . zolpidem (AMBIEN) 5 MG tablet Take 1 tablet (5 mg total) by mouth at bedtime as needed for sleep. 30 tablet 0   No current facility-administered  medications for this encounter.     Allergies  Allergen Reactions  . Codeine Nausea And Vomiting  . Lipitor [Atorvastatin] Nausea Only      Social History   Social History  . Marital status: Single    Spouse name: N/A  . Number of children: N/A  . Years of education: N/A   Occupational History  . Retired   . Truck driver    Social History Main Topics  . Smoking status: Former Smoker    Packs/day: 1.50    Years: 23.00    Types: Cigarettes  Quit date: 08/27/2015  . Smokeless tobacco: Never Used  . Alcohol use No  . Drug use: No     Comment: Former  . Sexual activity: Not on file   Other Topics Concern  . Not on file   Social History Narrative   Divorced with one child   No regular exercise      Family History  Problem Relation Age of Onset  . CAD Father   . Diabetes Father   . Heart failure Father     Vitals:   06/23/16 1146  BP: 94/62  Pulse: 86  SpO2: 93%  Weight: 223 lb 4 oz (101.3 kg)     Wt Readings from Last 3 Encounters:  06/23/16 223 lb 4 oz (101.3 kg)  06/20/16 221 lb (100.2 kg)  05/12/16 223 lb 8 oz (101.4 kg)     PHYSICAL EXAM: General:  Sitting on exam table No respiratory difficulty HEENT: normal Neck: supple. JVP 7.  Carotids 2+ bilat; no bruits. No thyromegaly or nodule noted.  Cor: PMI laterally displaced. RRR. No M/G/R Lungs: Clear to auscultation bilaterally, normal effort Abdomen: Obese, NT, ND, no HSM. No bruits or masses. +BS Extremities: no cyanosis, clubbing. R and LLE Chronic venous stasis changes with healing wound on R shin  Neuro: alert & oriented x 3, cranial nerves grossly intact. moves all 4 extremities w/o difficulty. Affect pleasant.   ASSESSMENT & PLAN:  1. Chronic Systolic HF with severe biventricular dysfunction EF 15% due to NICM. Has medtronic ICD.  - Slightly worse today. NYHA III-IIIb symptoms on milrinone 0.25 mcg/kg/min. Will check co-ox today to see if we need to increase milrinone.  - Volume  status stable. Continue torsemide 40 mg twice a day.   - Continue losartan 25 bid. Cannot switch to [facility] now with soft BP - Continue imdur 30 mg.  - Continue digoxin - dig level 0.8 03/20/2016  - Continue spiro 25 mg daily.  - No BB for now with recent low output - resume cardiac rehab - Instructed to follow low salt diet and limtiing fluids to 2 liters.   - Now undergoing transplant work-up at [facility]. I will reach out to them and see if we can help with some of the pre-transplant testing here. RV dysfunction may limit opportunity for LVAD. Blood type O+.  2. CAD --severe 3v- CAD s/p CABG with occluded grafts as above except for LIMA. No targets for revascularization. No CP.  --Continue ASA, statin  3. HTN - Stable.   4. DMII - Followed by PCP. 5. Fatty liver with ascites - Continue with diet and weight loss and DM 2 control. Most recent A1c = 7.1   Bensimhon, Daniel,MD 12:10 PM

## 2016-06-23 NOTE — Patient Instructions (Signed)
Your physician recommends that you schedule a follow-up appointment in: 1 month  

## 2016-06-25 ENCOUNTER — Other Ambulatory Visit (HOSPITAL_COMMUNITY): Payer: Self-pay | Admitting: Infectious Diseases

## 2016-06-25 DIAGNOSIS — I2510 Atherosclerotic heart disease of native coronary artery without angina pectoris: Secondary | ICD-10-CM

## 2016-06-25 DIAGNOSIS — Z01818 Encounter for other preprocedural examination: Secondary | ICD-10-CM

## 2016-06-25 DIAGNOSIS — J449 Chronic obstructive pulmonary disease, unspecified: Secondary | ICD-10-CM

## 2016-06-25 DIAGNOSIS — Z1211 Encounter for screening for malignant neoplasm of colon: Secondary | ICD-10-CM

## 2016-06-25 DIAGNOSIS — I5022 Chronic systolic (congestive) heart failure: Secondary | ICD-10-CM

## 2016-06-28 ENCOUNTER — Encounter: Payer: Self-pay | Admitting: Gastroenterology

## 2016-06-29 ENCOUNTER — Ambulatory Visit (HOSPITAL_COMMUNITY)
Admission: RE | Admit: 2016-06-29 | Discharge: 2016-06-29 | Disposition: A | Discharge status: Home / Self Care | Payer: Medicare HMO | Source: Ambulatory Visit | Attending: Cardiology | Admitting: Cardiology

## 2016-06-29 DIAGNOSIS — I5022 Chronic systolic (congestive) heart failure: Secondary | ICD-10-CM | POA: Diagnosis not present

## 2016-06-29 LAB — CARBOXYHEMOGLOBIN
CARBOXYHEMOGLOBIN: 3.5 % — AB (ref 0.5–1.5)
METHEMOGLOBIN: 1 % (ref 0.0–1.5)
O2 SAT: 50 %
TOTAL HEMOGLOBIN: 12.1 g/dL (ref 12.0–16.0)

## 2016-06-30 ENCOUNTER — Telehealth (HOSPITAL_COMMUNITY): Payer: Self-pay | Admitting: *Deleted

## 2016-06-30 MED ORDER — MILRINONE LACTATE IN DEXTROSE 20-5 MG/100ML-% IV SOLN: 0.3750 ug/kg/min | INTRAVENOUS | 0 refills | Status: DC

## 2016-06-30 NOTE — Telephone Encounter (Signed)
-----   Message from Jolaine Artist, MD sent at 06/29/2016  4:15 PM EDT ----- Increase milrinone

## 2016-06-30 NOTE — Telephone Encounter (Signed)
Notes Recorded by Scarlette Calico, RN on 06/30/2016 at 4:16 PM EDT Left pt mess on his ID VM stating med needed to be increased and [place] will do that tomorrow, spoke w/Pam Tamera Punt at [facility] and gave VO for dose increase.

## 2016-07-05 ENCOUNTER — Telehealth (HOSPITAL_COMMUNITY): Payer: Self-pay | Admitting: Infectious Diseases

## 2016-07-05 ENCOUNTER — Encounter (HOSPITAL_COMMUNITY): Payer: Self-pay | Admitting: Infectious Diseases

## 2016-07-05 ENCOUNTER — Other Ambulatory Visit (HOSPITAL_COMMUNITY): Payer: Self-pay | Admitting: Infectious Diseases

## 2016-07-05 DIAGNOSIS — I259 Chronic ischemic heart disease, unspecified: Secondary | ICD-10-CM

## 2016-07-05 DIAGNOSIS — Z01818 Encounter for other preprocedural examination: Secondary | ICD-10-CM

## 2016-07-05 NOTE — Telephone Encounter (Signed)
Called patient re: availability for testing in next few weeks to satisfy [facility] request for testing:   -RHC (PA pending as of today still) -PFTs w/ DLCOcor -Complete Abd [place] -PVL's (ABI/Carotid dopp) -Colonoscopy (referral sent to GI 06/25/16) -PPD test (will need to be obtained at PCP office and fax records to [place]).   [name], RN VAD Coordinator   Office: [phone] 24/7 Emergency VAD Pager: [phone]

## 2016-07-06 ENCOUNTER — Encounter: Payer: Medicare HMO | Attending: Internal Medicine

## 2016-07-06 ENCOUNTER — Telehealth (HOSPITAL_COMMUNITY): Payer: Self-pay | Admitting: Infectious Diseases

## 2016-07-06 DIAGNOSIS — Z95810 Presence of automatic (implantable) cardiac defibrillator: Secondary | ICD-10-CM | POA: Insufficient documentation

## 2016-07-06 DIAGNOSIS — I5022 Chronic systolic (congestive) heart failure: Secondary | ICD-10-CM | POA: Insufficient documentation

## 2016-07-06 DIAGNOSIS — Z87891 Personal history of nicotine dependence: Secondary | ICD-10-CM | POA: Insufficient documentation

## 2016-07-06 NOTE — Telephone Encounter (Signed)
Called re: specifics of the testing schedule I have set up for him for transplant evaluation. When we were discussing PFT's he informed me that he "messed up and was smoking cigarettes recently and [facility] found it on his blood work." We discussed that this will set back things for his transplant evaluation by a few months and will require follow up testing to prove he has abstained from tobacco. I informed him this applies to all tobacco smokeless included. He assures me that he will find other ways to deal with his stress.   Will forward all results to [facility] coordinator once I have everything together for his evaluation. He understands and is knowledgeable of all upcoming appointments.   [name], RN VAD Coordinator   Office: [phone] 24/7 Emergency VAD Pager: [phone]

## 2016-07-07 ENCOUNTER — Ambulatory Visit: Payer: Medicare HMO | Admitting: Gastroenterology

## 2016-07-07 ENCOUNTER — Telehealth: Payer: Self-pay | Admitting: Gastroenterology

## 2016-07-07 NOTE — Telephone Encounter (Signed)
Jess, Do you want to charge for late cancellation?

## 2016-07-07 NOTE — Telephone Encounter (Signed)
No charge. 

## 2016-07-08 ENCOUNTER — Ambulatory Visit (HOSPITAL_COMMUNITY): Payer: Medicare HMO

## 2016-07-08 ENCOUNTER — Telehealth (HOSPITAL_COMMUNITY): Payer: Self-pay

## 2016-07-08 ENCOUNTER — Ambulatory Visit (HOSPITAL_COMMUNITY): Admission: RE | Admit: 2016-07-08 | Payer: Medicare HMO | Source: Ambulatory Visit

## 2016-07-08 ENCOUNTER — Ambulatory Visit (HOSPITAL_COMMUNITY)
Admission: RE | Admit: 2016-07-08 | Discharge: 2016-07-08 | Disposition: A | Discharge status: Home / Self Care | Payer: Medicare HMO | Source: Ambulatory Visit | Attending: Internal Medicine | Admitting: Internal Medicine

## 2016-07-08 DIAGNOSIS — Z452 Encounter for adjustment and management of vascular access device: Secondary | ICD-10-CM | POA: Insufficient documentation

## 2016-07-08 NOTE — Telephone Encounter (Signed)
This gentleman calls CHF clinic triage line who is currently with [facility] home health and has run into a situation wwhere a [place] is not gauranteed to come to his home as scheduled today for PICC dressing change and milrinone bag change out. Per patient report, his nurse is due to come out on Thursdays at 10am for dressing change of PICC and mil bag change out. Nurse let him know today that she couldn't make it out, and that someone would come tomorrow. Patient told her his PICC dressing was almost off and that mil bag would run out before then (he states this happaned before and the mil bag did in fact run out overnight and he had to come in to hospital). Nurse told him she would see if she could find someone to come by, but couldn't promise anything, and would call him later this afternoon if she could find someone. Otherwise, someone would come tomorrow.  Reached out to Carolynn Sayers with [facility] for support and guidance in this situation.  States they are able to take over this patient's [place] needs (as patient requests) and would also be able to come to CHF clinic this afternoon to change patient's PICC dressing and milrinone bag. Patient made aware and reports will be to clinic in 30-45 minutes.  Renee Pain

## 2016-07-08 NOTE — Telephone Encounter (Signed)
Patient called CHF clinic

## 2016-07-09 ENCOUNTER — Encounter (HOSPITAL_COMMUNITY): Payer: Self-pay

## 2016-07-09 ENCOUNTER — Telehealth: Payer: Self-pay | Admitting: *Deleted

## 2016-07-09 ENCOUNTER — Other Ambulatory Visit (HOSPITAL_COMMUNITY): Payer: Self-pay | Admitting: Internal Medicine

## 2016-07-09 ENCOUNTER — Other Ambulatory Visit (HOSPITAL_COMMUNITY): Payer: Self-pay

## 2016-07-09 ENCOUNTER — Ambulatory Visit (HOSPITAL_COMMUNITY)
Admission: RE | Admit: 2016-07-09 | Discharge: 2016-07-09 | Disposition: A | Discharge status: Home / Self Care | Payer: Medicare HMO | Source: Ambulatory Visit | Attending: Internal Medicine | Admitting: Internal Medicine

## 2016-07-09 ENCOUNTER — Encounter: Payer: Self-pay | Admitting: *Deleted

## 2016-07-09 DIAGNOSIS — I5022 Chronic systolic (congestive) heart failure: Secondary | ICD-10-CM | POA: Diagnosis present

## 2016-07-09 DIAGNOSIS — J449 Chronic obstructive pulmonary disease, unspecified: Secondary | ICD-10-CM

## 2016-07-09 DIAGNOSIS — Z01818 Encounter for other preprocedural examination: Secondary | ICD-10-CM | POA: Insufficient documentation

## 2016-07-09 LAB — PULMONARY FUNCTION TEST
DL/VA % PRED: 88 %
DL/VA: 4.12 ml/min/mmHg/L
DLCO UNC % PRED: 58 %
DLCO unc: 19.01 ml/min/mmHg
FEF 25-75 POST: 1.22 L/s
FEF 25-75 PRE: 0.91 L/s
FEF2575-%CHANGE-POST: 34 %
FEF2575-%PRED-PRE: 30 %
FEF2575-%Pred-Post: 40 %
FEV1-%CHANGE-POST: 12 %
FEV1-%PRED-POST: 45 %
FEV1-%Pred-Pre: 40 %
FEV1-Post: 1.64 L
FEV1-Pre: 1.45 L
FEV1FVC-%CHANGE-POST: 0 %
FEV1FVC-%PRED-PRE: 84 %
FEV6-%Change-Post: 19 %
FEV6-%PRED-POST: 55 %
FEV6-%Pred-Pre: 46 %
FEV6-POST: 2.53 L
FEV6-Pre: 2.13 L
FEV6FVC-%Change-Post: 0 %
FEV6FVC-%Pred-Post: 104 %
FEV6FVC-%Pred-Pre: 104 %
FVC-%CHANGE-POST: 12 %
FVC-%PRED-PRE: 47 %
FVC-%Pred-Post: 53 %
FVC-POST: 2.55 L
FVC-PRE: 2.26 L
POST FEV6/FVC RATIO: 100 %
PRE FEV1/FVC RATIO: 64 %
Post FEV1/FVC ratio: 64 %
Pre FEV6/FVC Ratio: 99 %
RV % pred: 133 %
RV: 3 L
TLC % PRED: 84 %
TLC: 5.88 L

## 2016-07-09 MED ORDER — ALBUTEROL SULFATE (2.5 MG/3ML) 0.083% IN NEBU
2.5000 mg | INHALATION_SOLUTION | Freq: Once | RESPIRATORY_TRACT | Status: AC
  Administered 2016-07-09: 2.5 mg via RESPIRATORY_TRACT

## 2016-07-09 NOTE — Progress Notes (Signed)
Patient came to clinic yesterday 07/08/2016 at 4:00 pm for PICC dressing change and milrinone battery and bag change out. Patient's HH RN with [facility] informed patient she would not be able to come yesterday for her q Thursday PICC care and would have someone come Friday. Patient explained to [facility] RN (per his report) that dressing was coming off and his milrinone bag was almost empty and he was scared that he would have to come to ED that night if milrinone bag ran out. Patient stated to this RN that he was told he would get a call back from [facility] if they could find someone to come out that evening. After speaking with [facility] liason Carolynn Sayers about this situation, we felt best for aptient to come to CHF clinic immediately to have PICC line care and to bring his milrinone bag for replacement in the clinic under Pam's supervision. Patient came to clinic at 4:00 pm, PICC dressing removed (was 50% unsecured), biopatch still in tact, insertion site looked clean, dry, no redness or swelling.  Skin surrounding site where dressing was present has multiple small round red bumps that patient complained of itching, similar to what may be a yeast/fungal superficial infection. Per Pam's advisement, cleaned area with sterile saline, applied dry 4x4 gauze x 2 over the PICC line, then placed 2 inch medipore tape overlapping over the gauze to secure.  2 anchors applied to secured PICC.  Patient states he has been showering with the cloth mesh overlay and saran wraps it, but "it still gets wet".  Think this may be cause of the red bumps surrounding insertion site.  Advised to try to shower every other day/every 3 days and do "spong baths" PRN, and when showering to remove mesh sock before showering and use mobile shower handle to rinse off in shower with arm extended out.  Milrinone bag and battery to pump changed with patient supply. Orders placed with Carolynn Sayers to have patient transferred to [place] for future PICC  and milrinone care and maintenance/managemnet per patient request.  Orders signed by Dr. Aundra Dubin and faxed to provided fax # [phone] Attn: pharmacy per [facility] instruction. [place] agency never called patient back while he was in clinic in our presence (4 pm-5:30 pm).  Renee Pain, RN

## 2016-07-09 NOTE — Telephone Encounter (Signed)
Been taking lots of test getting worked up for heart.  Been moving things around.  [place] Nurse was not able to show up, went to clinic to get new bandage and milrinone.  Feeling very tired.  Hopes to return on Monday.

## 2016-07-12 ENCOUNTER — Ambulatory Visit (HOSPITAL_COMMUNITY)
Admission: RE | Admit: 2016-07-12 | Discharge: 2016-07-12 | Disposition: A | Discharge status: Home / Self Care | Payer: Medicare HMO | Source: Ambulatory Visit | Attending: Internal Medicine | Admitting: Internal Medicine

## 2016-07-12 DIAGNOSIS — Z9049 Acquired absence of other specified parts of digestive tract: Secondary | ICD-10-CM | POA: Diagnosis not present

## 2016-07-12 DIAGNOSIS — Z01818 Encounter for other preprocedural examination: Secondary | ICD-10-CM | POA: Diagnosis not present

## 2016-07-12 DIAGNOSIS — I5022 Chronic systolic (congestive) heart failure: Secondary | ICD-10-CM

## 2016-07-13 ENCOUNTER — Ambulatory Visit (HOSPITAL_COMMUNITY)
Admission: RE | Admit: 2016-07-13 | Discharge: 2016-07-13 | Disposition: A | Discharge status: Home / Self Care | Payer: Medicare HMO | Source: Ambulatory Visit | Attending: Internal Medicine | Admitting: Internal Medicine

## 2016-07-13 DIAGNOSIS — I6523 Occlusion and stenosis of bilateral carotid arteries: Secondary | ICD-10-CM | POA: Insufficient documentation

## 2016-07-13 DIAGNOSIS — I259 Chronic ischemic heart disease, unspecified: Secondary | ICD-10-CM | POA: Diagnosis present

## 2016-07-13 DIAGNOSIS — Z01818 Encounter for other preprocedural examination: Secondary | ICD-10-CM | POA: Diagnosis present

## 2016-07-13 NOTE — Progress Notes (Signed)
Pre-op Cardiac Surgery  Carotid Findings:  Bilateral:  1-39% ICA stenosis.  Vertebral artery flow is antegrade.  Brachial pressure 80 mm Hg  Lower  Extremity Right Left  Dorsalis Pedis 73 Triphasic 82 Triphasic  Posterior Tibial 83 Triphasic 85 Triphasic  Ankle/Brachial Indices 1.04 1.03    Findings:  ABIs and Doppler waveforms are within normal limits bilaterally at rest.    Toma Copier, RVS 07/13/16 06:30PM

## 2016-07-14 ENCOUNTER — Encounter: Payer: Self-pay | Admitting: *Deleted

## 2016-07-14 DIAGNOSIS — I5022 Chronic systolic (congestive) heart failure: Secondary | ICD-10-CM

## 2016-07-14 LAB — VAS US DOPPLER PRE VAD
LCCADDIAS: -18 cm/s
LCCADSYS: -68 cm/s
LCCAPDIAS: 26 cm/s
LEFT ECA DIAS: -12 cm/s
LEFT VERTEBRAL DIAS: -14 cm/s
LICAPDIAS: -13 cm/s
LICAPSYS: -48 cm/s
Left CCA prox sys: 100 cm/s
RCCAPDIAS: 14 cm/s
RCCAPSYS: 88 cm/s
RIGHT ECA DIAS: -12 cm/s
RIGHT VERTEBRAL DIAS: -6 cm/s
Right cca dist sys: -36 cm/s

## 2016-07-14 NOTE — Progress Notes (Signed)
Cardiac Individual Treatment Plan  Patient Details  Name: Ronald Miller. MRN: [id number] Date of Birth: 09-29-1956 Referring Provider:   Flowsheet Row Cardiac Rehab from 05/24/2016 in [facility] Cardiac and Pulmonary Rehab  Referring Provider  Glori Bickers MD      Initial Encounter Date:  Flowsheet Row Cardiac Rehab from 05/24/2016 in [facility] Cardiac and Pulmonary Rehab  Date  05/24/16  Referring Provider  Glori Bickers MD      Visit Diagnosis: Heart failure, chronic systolic ([place])  Patient's Home Medications on Admission:  Current Outpatient Prescriptions:  .  acetaminophen (TYLENOL) 500 MG tablet, Take 1,000 mg by mouth every 6 (six) hours as needed for mild pain or headache., Disp: , Rfl:  .  albuterol (PROVENTIL HFA;VENTOLIN HFA) 108 (90 BASE) MCG/ACT inhaler, Inhale 2 puffs into the lungs every 6 (six) hours as needed for wheezing or shortness of breath., Disp: , Rfl:  .  aspirin EC 81 MG tablet, Take 81 mg by mouth every morning. , Disp: , Rfl:  .  atorvastatin (LIPITOR) 20 MG tablet, Take 20 mg by mouth daily. , Disp: , Rfl:  .  baclofen (LIORESAL) 10 MG tablet, Take 10 mg by mouth 3 (three) times daily as needed for muscle spasms., Disp: , Rfl:  .  digoxin (LANOXIN) 0.25 MG tablet, Take 1 tablet (0.25 mg total) by mouth daily., Disp: 90 tablet, Rfl: 1 .  escitalopram (LEXAPRO) 20 MG tablet, Take 1 tablet (20 mg total) by mouth daily., Disp: 30 tablet, Rfl: 0 .  insulin aspart (NOVOLOG) 100 UNIT/ML injection, Inject 10 Units into the skin 3 (three) times daily before meals., Disp: , Rfl:  .  insulin glargine (LANTUS) 100 UNIT/ML injection, Inject 40 Units into the skin at bedtime., Disp: , Rfl:  .  isosorbide mononitrate (IMDUR) 30 MG 24 hr tablet, Take 1 tablet (30 mg total) by mouth daily., Disp: 30 tablet, Rfl: 3 .  losartan (COZAAR) 25 MG tablet, Take 1 tablet (25 mg total) by mouth 2 (two) times daily., Disp: 180 tablet, Rfl: 1 .  metolazone (ZAROXOLYN) 5 MG tablet,  Take 5 mg by mouth as needed (if weight >229 lb)., Disp: , Rfl:  .  metoprolol succinate (TOPROL-XL) 100 MG 24 hr tablet, Take 100 mg by mouth daily. Take with or immediately following a meal., Disp: , Rfl:  .  milrinone (PRIMACOR) 20 MG/100 ML SOLN infusion, Inject 41.025 mcg/min into the vein continuous., Disp: 1000 mL, Rfl: 0 .  Multiple Vitamins-Minerals (SENTRY ADULT) TABS, Take 1 tablet by mouth daily., Disp: , Rfl:  .  Omega-3 Fatty Acids (FISH OIL) 1000 MG CAPS, Take 1,000 mg by mouth daily., Disp: , Rfl:  .  potassium chloride SA (K-DUR,KLOR-CON) 20 MEQ tablet, Take 1 tablet (20 mEq total) by mouth 2 (two) times daily., Disp: 180 tablet, Rfl: 1 .  senna (SENOKOT) 8.6 MG TABS tablet, Take 2 tablets (17.2 mg total) by mouth at bedtime. (Patient taking differently: Take 2 tablets by mouth at bedtime as needed for mild constipation. ), Disp: 30 each, Rfl: 0 .  spironolactone (ALDACTONE) 25 MG tablet, Take 1 tablet (25 mg total) by mouth daily., Disp: 90 tablet, Rfl: 1 .  torsemide (DEMADEX) 20 MG tablet, Take 2 tablets (40 mg total) by mouth 2 (two) times daily., Disp: 360 tablet, Rfl: 3 .  zolpidem (AMBIEN) 5 MG tablet, Take 1 tablet (5 mg total) by mouth at bedtime as needed for sleep., Disp: 30 tablet, Rfl: 0  Past Medical  History: Past Medical History:  Diagnosis Date  . AICD (automatic cardioverter/defibrillator) present   . ASCVD (arteriosclerotic cardiovascular disease)   . Chronic systolic CHF (congestive heart failure) ([place])   . COPD (chronic obstructive pulmonary disease) ([place])   . Coronary artery disease   . Depression   . Diabetes mellitus   . History of cocaine abuse   . Hypertension   . Shortness of breath dyspnea   . Suicidal ideation   . Tobacco abuse     Tobacco Use: History  Smoking Status  . Former Smoker  . Packs/day: 1.50  . Years: 23.00  . Types: Cigarettes  . Quit date: 08/27/2015  Smokeless Tobacco  . Never Used    Labs: Recent Review Flowsheet  Data    Labs for ITP Cardiac and Pulmonary Rehab Latest Ref Rng & Units 03/23/2016 03/24/2016 03/25/2016 06/23/2016 06/29/2016   Cholestrol 0 - 200 mg/dL - - - - -   LDLCALC 0 - 100 mg/dL - - - - -   HDL 40 - 60 mg/dL - - - - -   Trlycerides 0 - 200 mg/dL - - - - -   Hemoglobin A1c 4.8 - 5.6 % - - - - -   PHART 7.350 - 7.450 - - - - -   PCO2ART 35.0 - 45.0 mmHg - - - - -   HCO3 20.0 - 24.0 mEq/L - - - - -   TCO2 0 - 100 mmol/L - - - - -   O2SAT % 58.4 61.6 59.8 55.4 50.0       Exercise Target Goals:    Exercise Program Goal: Individual exercise prescription set with THRR, safety & activity barriers. Participant demonstrates ability to understand and report RPE using BORG scale, to self-measure pulse accurately, and to acknowledge the importance of the exercise prescription.  Exercise Prescription Goal: Starting with aerobic activity 30 plus minutes a day, 3 days per week for initial exercise prescription. Provide home exercise prescription and guidelines that participant acknowledges understanding prior to discharge.  Activity Barriers & Risk Stratification:     Activity Barriers & Cardiac Risk Stratification - 05/24/16 1516      Activity Barriers & Cardiac Risk Stratification   Activity Barriers Back Problems;Shortness of Breath   Cardiac Risk Stratification High      6 Minute Walk:     6 Minute Walk    Row Name 05/24/16 1343         6 Minute Walk   Phase Initial     Distance 1200 feet     Walk Time 6 minutes     # of Rest Breaks 0     MPH 2.27     METS 3.09     RPE 13     VO2 Peak 10.82     Symptoms Yes (comment)     Comments muscles in chest (intercostals) and calves sore, ankles were sore     Resting HR 81 bpm     Resting BP 122/64     Max Ex. HR 109 bpm     Max Ex. BP 126/62     2 Minute Post BP 126/64       Interval Oxygen   Interval Oxygen? Yes     Baseline Oxygen Saturation % 94 %     Baseline Liters of Oxygen 0 L  Room Air     1 Minute Oxygen  Saturation % 92 %     1 Minute Liters of Oxygen 0  L     2 Minute Oxygen Saturation % 92 %     2 Minute Liters of Oxygen 0 L     3 Minute Oxygen Saturation % 93 %     3 Minute Liters of Oxygen 0 L     4 Minute Oxygen Saturation % 94 %     4 Minute Liters of Oxygen 0 L     5 Minute Oxygen Saturation % 94 %     5 Minute Liters of Oxygen 0 L     6 Minute Oxygen Saturation % 94 %     6 Minute Liters of Oxygen 0 L     2 Minute Post Oxygen Saturation % 97 %     2 Minute Post Liters of Oxygen 0 L        Initial Exercise Prescription:     Initial Exercise Prescription - 05/24/16 1300      Date of Initial Exercise RX and Referring Provider   Date 05/24/16   Referring Provider Glori Bickers MD     Treadmill   MPH 2   Grade 1   Minutes 15   METs 2.81     NuStep   Level 3  80-100 spm   Minutes 15   METs 2     Recumbant Elliptical   Level 2   RPM 50   Minutes 15   METs 2     Prescription Details   Frequency (times per week) 2   Duration Progress to 45 minutes of aerobic exercise without signs/symptoms of physical distress     Intensity   THRR 40-80% of Max Heartrate 113-145   Ratings of Perceived Exertion 11-13   Perceived Dyspnea 0-4     Progression   Progression Continue to progress workloads to maintain intensity without signs/symptoms of physical distress.     Resistance Training   Training Prescription Yes   Weight 3 lbs   Reps 10-15      Perform Capillary Blood Glucose checks as needed.  Exercise Prescription Changes:     Exercise Prescription Changes    Row Name 06/09/16 1400 06/23/16 1400           Exercise Review   Progression -  From First full day of exercise 8/24 -  not a full session        Response to Exercise   Blood Pressure (Admit) 124/60 120/60      Blood Pressure (Exercise) 126/74  -      Blood Pressure (Exit) 114/50 120/70      Heart Rate (Admit) 54 bpm 70 bpm      Heart Rate (Exercise) 109 bpm 106 bpm      Heart Rate  (Exit) 72 bpm 73 bpm      Rating of Perceived Exertion (Exercise) 15 13      Symptoms none coughing      Duration Progress to 45 minutes of aerobic exercise without signs/symptoms of physical distress Progress to 45 minutes of aerobic exercise without signs/symptoms of physical distress      Intensity THRR unchanged THRR unchanged        Progression   Progression Continue to progress workloads to maintain intensity without signs/symptoms of physical distress. Continue to progress workloads to maintain intensity without signs/symptoms of physical distress.      Average METs 1.92 2.3        Resistance Training   Training Prescription Yes  -      Weight 3 lbs  -  Reps 10-15  -        Interval Training   Interval Training No  -        Treadmill   MPH 1.4  -      Grade 0.5  -      Minutes 15  -      METs 2.17  -        NuStep   Level 3 3      Minutes 15 15      METs 2.2 2.3        Recumbant Elliptical   Level 2  -      RPM 50  -      Minutes 15  -      METs 1.4  -         Exercise Comments:     Exercise Comments    Row Name 05/27/16 1057 06/09/16 1448 06/17/16 1026 06/23/16 1433     Exercise Comments First full day of exercise!  Patient was oriented to gym and equipment including functions, settings, policies, and procedures.  Patient's individual exercise prescription and treatment plan were reviewed.  All starting workloads were established based on the results of the 6 minute walk test done at initial orientation visit.  The plan for exercise progression was also introduced and progression will be customized based on patient's performance and goals.  Ronald Miller did need to reduce workloads on the treadmill today for his first day. He also had a dizzy spell when he finished the treadmill as his blood pressure had dropped.  He was given water and recovered; he was able to finish the rest of the class without symptoms.  His blood sugar dropped during exercise as he only had  cantaloupe and watermelon for breakfast.  He was given PB crackers and encouraged to eat protein for breakfast. Ronald Miller has been out with cellulitis since last vist on 8/24. Ronald Miller. did not complete his rehab session.  He was sent home early after coughing up green phlegm.  He is going to try the 4pm class on Monday. Ronald Miller has been out since last review with bronchitis.  He was seen in office today and cleared to return to rehab.       Discharge Exercise Prescription (Final Exercise Prescription Changes):     Exercise Prescription Changes - 06/23/16 1400      Exercise Review   Progression --  not a full session     Response to Exercise   Blood Pressure (Admit) 120/60   Blood Pressure (Exit) 120/70   Heart Rate (Admit) 70 bpm   Heart Rate (Exercise) 106 bpm   Heart Rate (Exit) 73 bpm   Rating of Perceived Exertion (Exercise) 13   Symptoms coughing   Duration Progress to 45 minutes of aerobic exercise without signs/symptoms of physical distress   Intensity THRR unchanged     Progression   Progression Continue to progress workloads to maintain intensity without signs/symptoms of physical distress.   Average METs 2.3     NuStep   Level 3   Minutes 15   METs 2.3      Nutrition:  Target Goals: Understanding of nutrition guidelines, daily intake of sodium 1500mg , cholesterol 200mg , calories 30% from fat and 7% or less from saturated fats, daily to have 5 or more servings of fruits and vegetables.  Biometrics:    Nutrition Therapy Plan and Nutrition Goals:     Nutrition Therapy & Goals - 05/24/16 1521  Nutrition Therapy   Drug/Food Interactions Statins/Certain Fruits     Intervention Plan   Intervention Prescribe, educate and counsel regarding individualized specific dietary modifications aiming towards targeted core components such as weight, hypertension, lipid management, diabetes, heart failure and other comorbidities.   Expected Outcomes Short Term Goal:  Understand basic principles of dietary content, such as calories, fat, sodium, cholesterol and nutrients.;Long Term Goal: Adherence to prescribed nutrition plan.;Short Term Goal: A plan has been developed with personal nutrition goals set during dietitian appointment.      Nutrition Discharge: Rate Your Plate Scores:   Nutrition Goals Re-Evaluation:   Psychosocial: Target Goals: Acknowledge presence or absence of depression, maximize coping skills, provide positive support system. Participant is able to verbalize types and ability to use techniques and skills needed for reducing stress and depression.  Initial Review & Psychosocial Screening:     Initial Psych Review & Screening - 05/24/16 1522      Initial Review   Current issues with History of Depression     Family Dynamics   Good Support System? Yes   Comments History of depression is listed in Ronald Miller's history. When I mentioned that he said what doctor wrote that -I don't have that.       Quality of Life Scores:     Quality of Life - 05/24/16 1856      Quality of Life Scores   Health/Function Pre 19.7 %   Socioeconomic Pre 18.43 %   Psych/Spiritual Pre 26.79 %   Family Pre 20.4 %   GLOBAL Pre 21 %      PHQ-9: Recent Review Flowsheet Data    Depression screen [facility] 2/9 05/24/2016 02/03/2016 11/06/2015   Decreased Interest 1 0 1   Down, Depressed, Hopeless 0 0 2   PHQ - 2 Score 1 0 3   Altered sleeping 2 - -   Tired, decreased energy 1 - -   Change in appetite 0 - -   Feeling bad or failure about yourself  1 - -   Trouble concentrating 0 - -   Moving slowly or fidgety/restless 2 - -   Suicidal thoughts 0 - 0   PHQ-9 Score 7 - -   Difficult doing work/chores Not difficult at all - -      Psychosocial Evaluation and Intervention:   Psychosocial Re-Evaluation:     Psychosocial Re-Evaluation    Row Name 06/01/16 [phone]             Psychosocial Re-Evaluation   Comments Ronald Miller called and said he is sorry that  he is not here in Cardiac Rehab today. He is afraid they are going to put him in the hospital due to cellulitis in his legs. Added stress/           Vocational Rehabilitation: Provide vocational rehab assistance to qualifying candidates.   Vocational Rehab Evaluation & Intervention:     Vocational Rehab - 05/24/16 1309      Initial Vocational Rehab Evaluation & Intervention   Assessment shows need for Vocational Rehabilitation No      Education: Education Goals: Education classes will be provided on a weekly basis, covering required topics. Participant will state understanding/return demonstration of topics presented.  Learning Barriers/Preferences:     Learning Barriers/Preferences - 05/24/16 1516      Learning Barriers/Preferences   Learning Barriers None   Learning Preferences None      Education Topics: General Nutrition Guidelines/Fats and Fiber: -Group instruction provided by verbal, written material,  models and posters to present the general guidelines for heart healthy nutrition. Gives an explanation and review of dietary fats and fiber.   Controlling Sodium/Reading Food Labels: -Group verbal and written material supporting the discussion of sodium use in heart healthy nutrition. Review and explanation with models, verbal and written materials for utilization of the food label.   Exercise Physiology & Risk Factors: - Group verbal and written instruction with models to review the exercise physiology of the cardiovascular system and associated critical values. Details cardiovascular disease risk factors and the goals associated with each risk factor.   Aerobic Exercise & Resistance Training: - Gives group verbal and written discussion on the health impact of inactivity. On the components of aerobic and resistive training programs and the benefits of this training and how to safely progress through these programs.   Flexibility, Balance, General Exercise  Guidelines: - Provides group verbal and written instruction on the benefits of flexibility and balance training programs. Provides general exercise guidelines with specific guidelines to those with heart or lung disease. Demonstration and skill practice provided.   Stress Management: - Provides group verbal and written instruction about the health risks of elevated stress, cause of high stress, and healthy ways to reduce stress.   Depression: - Provides group verbal and written instruction on the correlation between heart/lung disease and depressed mood, treatment options, and the stigmas associated with seeking treatment.   Anatomy & Physiology of the Heart: - Group verbal and written instruction and models provide basic cardiac anatomy and physiology, with the coronary electrical and arterial systems. Review of: AMI, Angina, Valve disease, Heart Failure, Cardiac Arrhythmia, Pacemakers, and the ICD.   Cardiac Procedures: - Group verbal and written instruction and models to describe the testing methods done to diagnose heart disease. Reviews the outcomes of the test results. Describes the treatment choices: Medical Management, Angioplasty, or Coronary Bypass Surgery.   Cardiac Medications: - Group verbal and written instruction to review commonly prescribed medications for heart disease. Reviews the medication, class of the drug, and side effects. Includes the steps to properly store meds and maintain the prescription regimen.   Go Sex-Intimacy & Heart Disease, Get SMART - Goal Setting: - Group verbal and written instruction through game format to discuss heart disease and the return to sexual intimacy. Provides group verbal and written material to discuss and apply goal setting through the application of the S.M.A.R.T. Method.   Other Matters of the Heart: - Provides group verbal, written materials and models to describe Heart Failure, Angina, Valve Disease, and Diabetes in the realm of  heart disease. Includes description of the disease process and treatment options available to the cardiac patient.   Exercise & Equipment Safety: - Individual verbal instruction and demonstration of equipment use and safety with use of the equipment. Flowsheet Row Cardiac Rehab from 05/24/2016 in [facility] Cardiac and Pulmonary Rehab  Date  05/24/16  Educator  C. EnterkinRN  Instruction Review Code  1- partially meets, needs review/practice      Infection Prevention: - Provides verbal and written material to individual with discussion of infection control including proper hand washing and proper equipment cleaning during exercise session. Flowsheet Row Cardiac Rehab from 05/24/2016 in [facility] Cardiac and Pulmonary Rehab  Date  05/24/16  Educator  C. [place]  Instruction Review Code  1- partially meets, needs review/practice      Falls Prevention: - Provides verbal and written material to individual with discussion of falls prevention and safety. [place] [facility]iac [facility]  from 05/24/2016 in [facility] Cardiac and Pulmonary Rehab  Date  05/24/16  Educator  C. [place]  Instruction Review Code  2- meets goals/outcomes      Diabetes: - Individual verbal and written instruction to review signs/symptoms of diabetes, desired ranges of glucose level fasting, after meals and with exercise. Advice that pre and post exercise glucose checks will be done for 3 sessions at entry of program. [place] from 05/24/2016 in [facility] Cardiac and Pulmonary Rehab  Date  05/24/16  Educator  C. [place]  Instruction Review Code  1- partially meets, needs review/practice       Knowledge Questionnaire Score:     Knowledge Questionnaire Score - 05/24/16 1305      Knowledge Questionnaire Score   Pre Score 24      Core Components/Risk Factors/Patient Goals at Admission:     Personal Goals and Risk Factors at Admission - 05/24/16 1521      Core Components/Risk Factors/Patient Goals on  Admission    Weight Management Yes;Weight Maintenance;Weight Loss   Intervention Weight Management: Develop a combined nutrition and exercise program designed to reach desired caloric intake, while maintaining appropriate intake of nutrient and fiber, sodium and fats, and appropriate energy expenditure required for the weight goal.;Weight Management: Provide education and appropriate resources to help participant work on and attain dietary goals.   Hypertension Yes   Intervention Provide education on lifestyle modifcations including regular physical activity/exercise, weight management, moderate sodium restriction and increased consumption of fresh fruit, vegetables, and low fat dairy, alcohol moderation, and smoking cessation.;Monitor prescription use compliance.   Expected Outcomes Short Term: Continued assessment and intervention until BP is < 140/50mm HG in hypertensive participants. < 130/5mm HG in hypertensive participants with diabetes, heart failure or chronic kidney disease.;Long Term: Maintenance of blood pressure at goal levels.      Core Components/Risk Factors/Patient Goals Review:      Goals and Risk Factor Review    Row Name 06/01/16 [phone]             Core Components/Risk Factors/Patient Goals Review   Review Ronald Miller called and said he is sorry that he is not here in Cardiac Rehab today. He is afraid they are going to put him in the hospital due to cellulitis in his legs.           Core Components/Risk Factors/Patient Goals at Discharge (Final Review):      Goals and Risk Factor Review - 06/01/16 0947      Core Components/Risk Factors/Patient Goals Review   Review Ronald Miller called and said he is sorry that he is not here in Cardiac Rehab today. He is afraid they are going to put him in the hospital due to cellulitis in his legs.       ITP Comments:     ITP Comments    Row Name 05/24/16 1519 05/24/16 1523 06/01/16 0942 06/16/16 0644 07/09/16 1314   ITP Comments "Ronald Miller"  is interested in getting his mother to exercise with him since she has [employer], government. A Cardiac Rehab REgistered dietician appt was made for Ronald Miller since he said he has problems what to eat with his diabetes and now heart problems with a 2 liter fluid restriction/day. History of depression is listed in Ronald Miller's history. When I mentioned that he said what doctor wrote that -I don't have that.  Ronald Miller called and said he is sorry that he is not here in Cardiac Rehab today. He is afraid they are going to  put him in the hospital due to cellulitis in his legs.  30 day review. Continue with ITP unless changes noted by Medical Director at signature of review. Called to check on status.  Out with testing and work up for possible heart transplant.  Hopes to return to 4pm class on Monday.   Ronald Miller Name 07/14/16 0638           ITP Comments 30 day review. Continue with ITP unless changes noted by Medical Director at signature of review. Has been out since 9/14          Comments:

## 2016-07-16 ENCOUNTER — Encounter: Payer: Self-pay | Admitting: Gastroenterology

## 2016-07-16 ENCOUNTER — Ambulatory Visit (INDEPENDENT_AMBULATORY_CARE_PROVIDER_SITE_OTHER): Payer: Medicare HMO | Admitting: Gastroenterology

## 2016-07-16 ENCOUNTER — Other Ambulatory Visit (HOSPITAL_COMMUNITY): Payer: Self-pay | Admitting: Internal Medicine

## 2016-07-16 VITALS — BP 88/52 | HR 82 | Ht 69.0 in | Wt 229.0 lb

## 2016-07-16 DIAGNOSIS — Z1211 Encounter for screening for malignant neoplasm of colon: Secondary | ICD-10-CM | POA: Diagnosis not present

## 2016-07-16 DIAGNOSIS — Z794 Long term (current) use of insulin: Secondary | ICD-10-CM | POA: Diagnosis not present

## 2016-07-16 DIAGNOSIS — E119 Type 2 diabetes mellitus without complications: Secondary | ICD-10-CM

## 2016-07-16 DIAGNOSIS — I5022 Chronic systolic (congestive) heart failure: Secondary | ICD-10-CM

## 2016-07-16 DIAGNOSIS — IMO0001 Reserved for inherently not codable concepts without codable children: Secondary | ICD-10-CM

## 2016-07-16 MED ORDER — NA SULFATE-K SULFATE-MG SULF 17.5-3.13-1.6 GM/177ML PO SOLN: 1.0000 | ORAL | 0 refills | Status: DC

## 2016-07-16 NOTE — Progress Notes (Addendum)
07/16/2016 Ronald Miller [id number] 1955/12/16   HISTORY OF PRESENT ILLNESS:  This is a 60 year old male who is new to our practice.  He was referred here by Dr. Haroldine Laws to schedule colonoscopy for screening as part of cardiac transplant evaluation.  He has CHF with EF 20-25% and is on milrinone gtt.  Has AICD.  Being evaluated for transplant through [facility].  Never had a colonoscopy in the past.  Has occasional constipation for which he uses Senokot prn.  Denies blood in his stool or any other GI complaints.  Past Medical History:  Diagnosis Date  . AICD (automatic cardioverter/defibrillator) present   . ASCVD (arteriosclerotic cardiovascular disease)   . Chronic systolic CHF (congestive heart failure) ([place])   . COPD (chronic obstructive pulmonary disease) ([place])   . Coronary artery disease   . Depression   . Diabetes mellitus   . History of cocaine abuse   . Hypertension   . Shortness of breath dyspnea   . Suicidal ideation   . Tobacco abuse    Past Surgical History:  Procedure Laterality Date  . CARDIAC CATHETERIZATION N/A 02/12/2016   Procedure: Right/Left Heart Cath and Coronary/Graft Angiography;  Surgeon: Jolaine Artist, MD;  Location: [place] [facility];  Service: Cardiovascular;  Laterality: N/A;  . CARDIAC CATHETERIZATION N/A 03/22/2016   Procedure: Right Heart Cath;  Surgeon: Jolaine Artist, MD;  Location: [place] [facility];  Service: Cardiovascular;  Laterality: N/A;  . CARDIAC DEFIBRILLATOR PLACEMENT    . CARDIAC DEFIBRILLATOR PLACEMENT    . CHOLECYSTECTOMY N/A 08/30/2015   Procedure: LAPAROSCOPIC CHOLECYSTECTOMY;  Surgeon: Hubbard Robinson, MD;  Location: [name];  Service: General;  Laterality: N/A;  . CORONARY ARTERY BYPASS GRAFT  2001  . INSERT / REPLACE / REMOVE PACEMAKER    . LUMBAR DISC SURGERY  02/1999   Discectomy and fusion  . VASECTOMY     Subsequent reversal    reports that he quit smoking about 10 months ago. His smoking use included  Cigarettes. He has a 34.50 pack-year smoking history. He has never used smokeless tobacco. He reports that he does not drink alcohol or use drugs. family history includes CAD in his father; Diabetes in his father; Heart failure in his father. Allergies  Allergen Reactions  . Codeine Nausea And Vomiting  . Lipitor [Atorvastatin] Nausea Only      Outpatient Encounter Prescriptions as of 07/16/2016  Medication Sig  . acetaminophen (TYLENOL) 500 MG tablet Take 1,000 mg by mouth every 6 (six) hours as needed for mild pain or headache.  . albuterol (PROVENTIL HFA;VENTOLIN HFA) 108 (90 BASE) MCG/ACT inhaler Inhale 2 puffs into the lungs every 6 (six) hours as needed for wheezing or shortness of breath.  Marland Kitchen aspirin EC 81 MG tablet Take 81 mg by mouth every morning.   Marland Kitchen atorvastatin (LIPITOR) 20 MG tablet Take 20 mg by mouth daily.   . baclofen (LIORESAL) 10 MG tablet Take 10 mg by mouth 3 (three) times daily as needed for muscle spasms.  . digoxin (LANOXIN) 0.25 MG tablet Take 1 tablet (0.25 mg total) by mouth daily.  Marland Kitchen escitalopram (LEXAPRO) 20 MG tablet Take 1 tablet (20 mg total) by mouth daily.  . insulin aspart (NOVOLOG) 100 UNIT/ML injection Inject 10 Units into the skin 3 (three) times daily before meals.  . insulin glargine (LANTUS) 100 UNIT/ML injection Inject 40 Units into the skin at bedtime.  . isosorbide mononitrate (IMDUR) 30 MG 24  hr tablet Take 1 tablet (30 mg total) by mouth daily.  Marland Kitchen losartan (COZAAR) 25 MG tablet Take 1 tablet (25 mg total) by mouth 2 (two) times daily.  . metolazone (ZAROXOLYN) 5 MG tablet Take 5 mg by mouth as needed (if weight >229 lb).  . metoprolol succinate (TOPROL-XL) 100 MG 24 hr tablet Take 100 mg by mouth daily. Take with or immediately following a meal.  . milrinone (PRIMACOR) 20 MG/100 ML SOLN infusion Inject 41.025 mcg/min into the vein continuous.  . Multiple Vitamins-Minerals (SENTRY ADULT) TABS Take 1 tablet by mouth daily.  . Omega-3 Fatty Acids  (FISH OIL) 1000 MG CAPS Take 1,000 mg by mouth daily.  . potassium chloride SA (K-DUR,KLOR-CON) 20 MEQ tablet Take 1 tablet (20 mEq total) by mouth 2 (two) times daily.  Marland Kitchen senna (SENOKOT) 8.6 MG TABS tablet Take 2 tablets (17.2 mg total) by mouth at bedtime. (Patient taking differently: Take 2 tablets by mouth at bedtime as needed for mild constipation. )  . spironolactone (ALDACTONE) 25 MG tablet Take 1 tablet (25 mg total) by mouth daily.  Marland Kitchen torsemide (DEMADEX) 20 MG tablet Take 2 tablets (40 mg total) by mouth 2 (two) times daily.  Marland Kitchen zolpidem (AMBIEN) 5 MG tablet Take 1 tablet (5 mg total) by mouth at bedtime as needed for sleep.  . Na Sulfate-K Sulfate-Mg Sulf 17.5-3.13-1.6 GM/180ML SOLN Take 1 kit by mouth as directed.   No facility-administered encounter medications on file as of 07/16/2016.      REVIEW OF SYSTEMS  : All other systems reviewed and negative except where noted in the History of Present Illness.   PHYSICAL EXAM: BP (!) 88/52   Pulse 82   Ht '5\' 9"'  (1.753 m)   Wt 229 lb (103.9 kg)   BMI 33.82 kg/m  General: Well developed white male in no acute distress Head: Normocephalic and atraumatic Eyes:  Sclerae anicteric, conjunctiva pink. Ears: Normal auditory acuity Lungs: Clear throughout to auscultation Heart: Regular rate and rhythm Abdomen: Soft, non-distended.  Normal bowel sounds.  Non-tender. Rectal:  Will be done at the time of colonoscopy. Musculoskeletal: Symmetrical with no gross deformities  Skin: No lesions on visible extremities Extremities: No edema  Neurological: Alert oriented x 4, grossly non-focal Psychological:  Alert and cooperative. Normal mood and affect  ASSESSMENT AND PLAN: -Screening colonoscopy:  Has EF of 20-25%.  Is on milrinone.  Needs colonoscopy as part of cardiac transplant evaluation.  Will schedule with Dr. Hilarie Fredrickson at [facility].  Will be performed in coordination with the heart failure clinic. -IDDM:  Insulin will be adjusted prior  to endoscopic procedure per protocol. Will resume normal dosing after procedure.   CC:  Larey Dresser, MD  Addendum: Reviewed and agree with initial management. Jerene Bears, MD

## 2016-07-16 NOTE — Patient Instructions (Addendum)
You have been scheduled for a colonoscopy. Please follow written instructions given to you at your visit today.  Please pick up your prep supplies at the pharmacy within the next 1-3 days. If you use inhalers (even only as needed), please bring them with you on the day of your procedure. Your physician has requested that you go to www.startemmi.com and enter the access code given to you at your visit today. This web site gives a general overview about your procedure. However, you should still follow specific instructions given to you by our office regarding your preparation for the procedure.    _  _   ORAL DIABETIC MEDICATION INSTRUCTIONS  The day before your procedure:  Take your diabetic pill as you do normally  The day of your procedure:  Do not take your diabetic pill   We will check your blood sugar levels during the admission process and again in Recovery before discharging you home  ______________________________________________________________________  _  _   INSULIN (LONG ACTING) MEDICATION INSTRUCTIONS (Lantus, NPH, 70/30, Humulin, Novolin-N, Levemir, )   The day before your procedure:  Take  your regular evening dose    The day of your procedure:  Do not take your morning dose   _  _   INSULIN (SHORT ACTING) MEDICATION INSTRUCTIONS (Regular, Humulog, Novolog, Apidra, Novolin, Humulin)   The day before your procedure:  Do not take your evening dose   The day of your procedure:  Do not take your morning dose  ______________________________________________________________________

## 2016-07-20 ENCOUNTER — Telehealth: Payer: Self-pay | Admitting: *Deleted

## 2016-07-20 ENCOUNTER — Encounter: Payer: Self-pay | Admitting: *Deleted

## 2016-07-20 DIAGNOSIS — I5022 Chronic systolic (congestive) heart failure: Secondary | ICD-10-CM

## 2016-07-20 NOTE — Telephone Encounter (Signed)
Called to check on status. LMOM

## 2016-07-23 ENCOUNTER — Other Ambulatory Visit (HOSPITAL_COMMUNITY): Payer: Self-pay | Admitting: Internal Medicine

## 2016-07-27 ENCOUNTER — Telehealth (HOSPITAL_COMMUNITY): Payer: Self-pay | Admitting: *Deleted

## 2016-07-27 ENCOUNTER — Other Ambulatory Visit (HOSPITAL_COMMUNITY): Payer: Self-pay | Admitting: *Deleted

## 2016-07-27 DIAGNOSIS — I5022 Chronic systolic (congestive) heart failure: Secondary | ICD-10-CM

## 2016-07-27 MED ORDER — ISOSORBIDE MONONITRATE ER 30 MG PO TB24: 30.0000 mg | ORAL_TABLET | Freq: Every day | ORAL | 3 refills | Status: DC

## 2016-07-27 NOTE — Telephone Encounter (Signed)
Rip Harbour, RN called to report pt has increased fatigue today.  She reports his wt was up to 220 lb earlier this week and he did take extra torsemide and a metolazone and wt is down to 215 lb today but he is feeling more fatigued and his BP is 88/60.  Advised to have pt continue to monitor, he should be careful increasing torsemide and taking metolazone at the same time.  She will advise pt and f/u with him tomorrow

## 2016-07-28 ENCOUNTER — Other Ambulatory Visit (HOSPITAL_COMMUNITY): Payer: Self-pay | Admitting: *Deleted

## 2016-07-28 DIAGNOSIS — I5022 Chronic systolic (congestive) heart failure: Secondary | ICD-10-CM

## 2016-07-28 MED ORDER — METOPROLOL SUCCINATE ER 100 MG PO TB24: 100.0000 mg | ORAL_TABLET | Freq: Every day | ORAL | 3 refills | Status: DC

## 2016-07-28 MED ORDER — POTASSIUM CHLORIDE CRYS ER 20 MEQ PO TBCR: EXTENDED_RELEASE_TABLET | ORAL | 1 refills | Status: DC

## 2016-07-28 MED ORDER — METOPROLOL SUCCINATE ER 100 MG PO TB24: 100.0000 mg | ORAL_TABLET | Freq: Every day | ORAL | 0 refills | Status: DC

## 2016-07-28 MED ORDER — ISOSORBIDE MONONITRATE ER 30 MG PO TB24: 30.0000 mg | ORAL_TABLET | Freq: Every day | ORAL | 0 refills | Status: DC

## 2016-07-28 NOTE — Telephone Encounter (Signed)
Rip Harbour, RN w/[facility] called requesting a 90 day supply of met be sent to [facility] and a 7 day supply of met and imdur sent to [place] as pt will be out of both meds.  All prescriptions sent in.  She also ask to clarify pt's KCL dose, she states pt told her we called him Mon and told him to increase dose to 40 AM and 20 PM.  Upon review of chart do not see this noted, pulled paper labs and there is a note from 10/23, pt's K was 3.4 and per Darrick Grinder, NP increase dose to 40 meq in AM and 20 meq in PM, pt was notified of changes 10/23 by Garlan Fair, CMA, med list updated to reflect this change

## 2016-07-30 ENCOUNTER — Encounter (HOSPITAL_COMMUNITY): Payer: Self-pay | Admitting: Internal Medicine

## 2016-07-30 ENCOUNTER — Ambulatory Visit (HOSPITAL_COMMUNITY)
Admission: RE | Admit: 2016-07-30 | Discharge: 2016-07-30 | Disposition: A | Discharge status: Home / Self Care | Payer: Medicare HMO | Source: Ambulatory Visit | Attending: Internal Medicine | Admitting: Internal Medicine

## 2016-07-30 VITALS — BP 98/64 | HR 91 | Wt 225.1 lb

## 2016-07-30 DIAGNOSIS — Z9889 Other specified postprocedural states: Secondary | ICD-10-CM | POA: Insufficient documentation

## 2016-07-30 DIAGNOSIS — I110 Hypertensive heart disease with heart failure: Secondary | ICD-10-CM | POA: Diagnosis not present

## 2016-07-30 DIAGNOSIS — I2510 Atherosclerotic heart disease of native coronary artery without angina pectoris: Secondary | ICD-10-CM

## 2016-07-30 DIAGNOSIS — F17210 Nicotine dependence, cigarettes, uncomplicated: Secondary | ICD-10-CM | POA: Insufficient documentation

## 2016-07-30 DIAGNOSIS — R188 Other ascites: Secondary | ICD-10-CM | POA: Insufficient documentation

## 2016-07-30 DIAGNOSIS — I428 Other cardiomyopathies: Secondary | ICD-10-CM | POA: Diagnosis not present

## 2016-07-30 DIAGNOSIS — Z794 Long term (current) use of insulin: Secondary | ICD-10-CM | POA: Insufficient documentation

## 2016-07-30 DIAGNOSIS — I5022 Chronic systolic (congestive) heart failure: Secondary | ICD-10-CM | POA: Diagnosis not present

## 2016-07-30 DIAGNOSIS — Z951 Presence of aortocoronary bypass graft: Secondary | ICD-10-CM | POA: Diagnosis not present

## 2016-07-30 DIAGNOSIS — J449 Chronic obstructive pulmonary disease, unspecified: Secondary | ICD-10-CM | POA: Insufficient documentation

## 2016-07-30 DIAGNOSIS — E119 Type 2 diabetes mellitus without complications: Secondary | ICD-10-CM | POA: Diagnosis not present

## 2016-07-30 DIAGNOSIS — F329 Major depressive disorder, single episode, unspecified: Secondary | ICD-10-CM | POA: Diagnosis not present

## 2016-07-30 DIAGNOSIS — Z7982 Long term (current) use of aspirin: Secondary | ICD-10-CM | POA: Diagnosis not present

## 2016-07-30 DIAGNOSIS — Z79899 Other long term (current) drug therapy: Secondary | ICD-10-CM | POA: Insufficient documentation

## 2016-07-30 DIAGNOSIS — K760 Fatty (change of) liver, not elsewhere classified: Secondary | ICD-10-CM | POA: Diagnosis not present

## 2016-07-30 LAB — RAPID URINE DRUG SCREEN, HOSP PERFORMED
AMPHETAMINES: NOT DETECTED
BARBITURATES: NOT DETECTED
BENZODIAZEPINES: NOT DETECTED
COCAINE: NOT DETECTED
Opiates: NOT DETECTED
Tetrahydrocannabinol: NOT DETECTED

## 2016-07-30 MED ORDER — NICOTINE 21 MG/24HR TD PT24: 21.0000 mg | MEDICATED_PATCH | Freq: Every day | TRANSDERMAL | 0 refills | Status: DC

## 2016-07-30 NOTE — Progress Notes (Signed)
Patient ID: Ronald Gudiel., male   DOB: November 25, 1955, 60 y.o.   MRN: [id number]    Advanced Heart Failure Clinic Note   Referring Provider: Darylene Price, FNP Primary Care: [facility] Primary Cardiologist: Dr Haroldine Laws.   HPI:  Ronald Hoben. is a 60 y.o. male with history of chronic systolic HF s/p Medtronic ICD 2014, CAD s/p CABG in 2000, HTN, Hx of cocaine abuse, Tobacco abuse, Depression, and COPD.   Echo 10/14/15 LVEF 20-25%, RV mildly dilated.   Admitted to [facility] from 4/24 - 1/66/06 with A/C systolic HF.  Had a US guided Paracentesis with 5 L removed. They decreased his home lasix to 40 mg daily due to low blood pressure. BB held as well with bradycardia and hypotension.  Discharge weight 239 lbs  Admitted 5/17 with volume overload and CP. Echo 02/10/16 LVEF 15%, Mild Ronald, Mod LAE, Severe RAE, severely reduced RV function, PA peak pressure 31 mm Hg. Underwent paracentesis 02/10/16 with 5.2 L out. Ultrasound showed severe fatty liver. Had [facility] yesterday with severe native 3v CAD, All grafts occluded except LIMA to LAD with faint L to R collaterals, elevated filling pressures with normal cardiac output. No targets for revascularization. Weight on d/c 247 -> 219 pounds.   Admitted 5/22 through 5/24 with chest pain and dyspnea. Diuresed with IV lasix and transitioned to torsemide 40 /20 daily. Discharge weight 228 pounds.   Admitted 6/16-> 6/22 with HF exacerbation.  Cath done 03/22/16 with low cardiac output (CI 1.8) and biventricular failure. Started on milrinone drip and sent home on 0.25 mcg/kg/min.  Diuresed 14 lbs. D/c weight 227 lbs.   Echo 8/17: LVEF 20% RV moderate to severe HK   He presents for regular follow up. Remains on milrinone 0.25 mcg/kg/min. Recently saw Dr. Hoyt Koch at [facility]. to discuss transplant listing and listing put on hold due to ongoing tobacco use and urine tox screen + for cocaine. Ronald Miller adamantly denies cocaine use. Says he feels bloated.  Weight at home 215-219 pounds. Taking metolazone once a week. Able to walk around the grocery store. No CP, orthopnea or PND. Dyspnea on mild to moderate exertion. Smoking 1 pack every other day. Having a rash around PICC line. Using dry dressings.   RHC 03/22/16 RA = 23 RV = 46/25 PA = 49/28 (36) PCW = 27 Fick cardiac output/index = 4.9/2.2 Thermo CO/Ci = 4.1/1.8 PVR = 2.2 WU Ao sat = 89% PA sat = 49%, 53% RA/PCWP = 0.85  R/LHC 02/12/16  Prox RCA lesion, 100% stenosed.  Prox Cx to Dist Cx lesion, 95% stenosed.  1st Mrg lesion, 100% stenosed.  2nd Mrg lesion, 100% stenosed.  Prox LAD lesion, 100% stenosed.  Ost LM to LM lesion, 50% stenosed.  Origin lesion, 100% stenosed.  Origin lesion, 100% stenosed.  Origin lesion, 100% stenosed.  Findings: Ao = 82/58 (69) LV = 84/15/27 RA = 14 RV = 45/11/17 PA = 49/24 (31) PCW = 28 Fick cardiac output/index = 5.3/2.4 PVR = 0.6 WU SVR = 837 FA sat = 98% PA sat = 63%, 62%  02/25/2016: K 3.5 Creatinine 1.03  03/10/2016: K 3.7 Creatinine 1.12   Past Medical History:  Diagnosis Date  . AICD (automatic cardioverter/defibrillator) present   . ASCVD (arteriosclerotic cardiovascular disease)   . Chronic systolic CHF (congestive heart failure) ([place])   . COPD (chronic obstructive pulmonary disease) ([place])   . Coronary artery disease   . Depression   . Diabetes mellitus   .  History of cocaine abuse   . Hypertension   . Shortness of breath dyspnea   . Suicidal ideation   . Tobacco abuse     Current Outpatient Prescriptions  Medication Sig Dispense Refill  . acetaminophen (TYLENOL) 500 MG tablet Take 1,000 mg by mouth every 6 (six) hours as needed for mild pain or headache.    . albuterol (PROVENTIL HFA;VENTOLIN HFA) 108 (90 BASE) MCG/ACT inhaler Inhale 2 puffs into the lungs every 6 (six) hours as needed for wheezing or shortness of breath.    Marland Kitchen aspirin EC 81 MG tablet Take 81 mg by mouth every morning.     Marland Kitchen atorvastatin  (LIPITOR) 20 MG tablet Take 20 mg by mouth daily.     . baclofen (LIORESAL) 10 MG tablet Take 10 mg by mouth 3 (three) times daily as needed for muscle spasms.    . digoxin (LANOXIN) 0.25 MG tablet TAKE 1 TABLET EVERY DAY 90 tablet 1  . escitalopram (LEXAPRO) 20 MG tablet Take 1 tablet (20 mg total) by mouth daily. 30 tablet 0  . insulin aspart (NOVOLOG) 100 UNIT/ML injection Inject 10 Units into the skin 3 (three) times daily before meals.    . insulin glargine (LANTUS) 100 UNIT/ML injection Inject 40 Units into the skin at bedtime.    . isosorbide mononitrate (IMDUR) 30 MG 24 hr tablet Take 1 tablet (30 mg total) by mouth daily. 7 tablet 0  . losartan (COZAAR) 25 MG tablet TAKE 1 TABLET TWICE DAILY 180 tablet 1  . metolazone (ZAROXOLYN) 5 MG tablet Take 5 mg by mouth as needed (if weight >229 lb).    . metoprolol succinate (TOPROL-XL) 100 MG 24 hr tablet Take 1 tablet (100 mg total) by mouth daily. Take with or immediately following a meal. 90 tablet 3  . milrinone (PRIMACOR) 20 MG/100 ML SOLN infusion Inject 41.025 mcg/min into the vein continuous. 1000 mL 0  . Multiple Vitamins-Minerals (SENTRY ADULT) TABS Take 1 tablet by mouth daily.    . Na Sulfate-K Sulfate-Mg Sulf 17.5-3.13-1.6 GM/180ML SOLN Take 1 kit by mouth as directed. 354 mL 0  . Omega-3 Fatty Acids (FISH OIL) 1000 MG CAPS Take 1,000 mg by mouth daily.    . potassium chloride SA (K-DUR,KLOR-CON) 20 MEQ tablet Take 2 tabs in AM and 1 tab in PM 180 tablet 1  . senna (SENOKOT) 8.6 MG TABS tablet Take 2 tablets (17.2 mg total) by mouth at bedtime. (Patient taking differently: Take 2 tablets by mouth at bedtime as needed for mild constipation. ) 30 each 0  . spironolactone (ALDACTONE) 25 MG tablet Take 1 tablet (25 mg total) by mouth daily. 90 tablet 1  . torsemide (DEMADEX) 20 MG tablet Take 2 tablets (40 mg total) by mouth 2 (two) times daily. 360 tablet 3  . zolpidem (AMBIEN) 5 MG tablet Take 1 tablet (5 mg total) by mouth at bedtime  as needed for sleep. 30 tablet 0   No current facility-administered medications for this encounter.     Allergies  Allergen Reactions  . Codeine Nausea And Vomiting  . Lipitor [Atorvastatin] Nausea Only      Social History   Social History  . Marital status: Single    Spouse name: N/A  . Number of children: N/A  . Years of education: N/A   Occupational History  . Retired   . Truck driver    Social History Main Topics  . Smoking status: Former Smoker    Packs/day: 1.50  Years: 23.00    Types: Cigarettes    Quit date: 08/27/2015  . Smokeless tobacco: Never Used  . Alcohol use No  . Drug use: No     Comment: Former  . Sexual activity: Not on file   Other Topics Concern  . Not on file   Social History Narrative   Divorced with one child   No regular exercise      Family History  Problem Relation Age of Onset  . CAD Father   . Diabetes Father   . Heart failure Father     Vitals:   07/30/16 1115  BP: 98/64  Pulse: 91  SpO2: 96%  Weight: 225 lb 1.9 oz (102.1 kg)     Wt Readings from Last 3 Encounters:  07/30/16 225 lb 1.9 oz (102.1 kg)  07/16/16 229 lb (103.9 kg)  06/23/16 223 lb 4 oz (101.3 kg)     PHYSICAL EXAM: General:  Sitting in the chair. No respiratory difficulty HEENT: normal Neck: supple. JVP 7-8  Carotids 2+ bilat; no bruits. No thyromegaly or nodule noted.  Cor: PMI laterally displaced. RRR. No M/G/R Lungs: Clear to auscultation bilaterally, normal effort Abdomen: Obese, NT, ND, no HSM. No bruits or masses. +BS Extremities: no cyanosis, clubbing. R and LLE Chronic venous stasis changes. RUE PICC. Ok with dry gauze.  Neuro: alert & oriented x 3, cranial nerves grossly intact. moves all 4 extremities w/o difficulty. Affect pleasant.   ASSESSMENT & PLAN:  1. Chronic Systolic HF with severe biventricular dysfunction EF 15% due to NICM. Has medtronic ICD.  -NYHA III-IIIb symptoms on milrinone 0.25 mcg/kg/min.   - Volume status stable.  Continue torsemide 40 mg twice a day.  Continue metolazone as needed.  - Continue losartan 25 bid. Cannot switch to [facility] now with soft BP - Continue imdur 30 mg.  - Continue digoxin - dig level 0.8 03/20/2016  - Continue spiro 25 mg daily.  - No BB for now with recent low output - Resume cardiac rehab - Instructed to follow low salt diet and limtiing fluids to 2 liters.   - Now undergoing transplant work-up at [facility].Blood type O+. Transplant listing put on hold due to ongoing tobacco use and + urine tox screen with cocaine (which he denies)  2. CAD --severe 3v- CAD s/p CABG with occluded grafts as above except for LIMA. No targets for revascularization. No CP.  --Continue ASA, statin  3. HTN - Stable.   4. DMII - Followed by PCP. 5. Fatty liver with ascites - Continue with diet and weight loss and DM 2 control. Most recent A1c = 7.1 6. Tobacco Abuse- Smoking 1 pack every other day.   Follow up in 4 weeks.   Darrick Grinder NP-C  11:33 AM  Patient seen and examined with Darrick Grinder, NP. We discussed all aspects of the encounter. I agree with the assessment and plan as stated above.   He remains NYHA III-IIIb on home milrinone. Volume status looks good. Long discussion about recent transplant visit and the fact that he will not be listed until he has at least 6 months of negative cotinine and cocaine screens. He denies cocaine use but will tr the patches to help with smoking cessation. ICD interrogation shows no VT. Volume status ok. I redressed his PICC site personally.   Total time spent 40 minutes. Over half that time spent discussing above.   Bensimhon, Daniel,MD 10:17 PM

## 2016-07-30 NOTE — Patient Instructions (Signed)
Labs today and every month  Your physician recommends that you schedule a follow-up appointment in: 1 month

## 2016-08-02 ENCOUNTER — Telehealth (HOSPITAL_COMMUNITY): Payer: Self-pay | Admitting: *Deleted

## 2016-08-02 ENCOUNTER — Telehealth: Payer: Self-pay | Admitting: Emergency Medicine

## 2016-08-02 DIAGNOSIS — I5022 Chronic systolic (congestive) heart failure: Secondary | ICD-10-CM

## 2016-08-02 NOTE — Telephone Encounter (Signed)
-----   Message from Jolaine Artist, MD sent at 07/19/2016  9:01 AM EDT ----- He does not need to be admitted. He has been very stable on milrinone at home. Please call if there are any issues.   ----- Message ----- From: Wyline Beady, CMA Sent: 07/16/2016   9:50 AM To: Jolaine Artist, MD, Lezlie Octave, RN  This patient is scheduled for a colonoscopy on 08/18/16 at [facility]. He does not have an LVAD but he is being worked up for a heart transplant. He is on Milrinone 20 mg/19ml. Please advise if you think he needs to be admitted for this procedure.

## 2016-08-02 NOTE — Telephone Encounter (Signed)
Vaughan Basta, RN left a VM on triage line that pt's PICC has been pulled out 15 cm.  Called and discussed w/Pam Tamera Punt, RN w/[facility], she is going to see if RN can start PIV for pt, if not she will send him to the ER.

## 2016-08-03 ENCOUNTER — Ambulatory Visit (HOSPITAL_COMMUNITY)
Admission: RE | Admit: 2016-08-03 | Discharge: 2016-08-03 | Disposition: A | Discharge status: Home / Self Care | Payer: Medicare HMO | Source: Ambulatory Visit | Attending: Interventional Radiology | Admitting: Interventional Radiology

## 2016-08-03 ENCOUNTER — Other Ambulatory Visit (HOSPITAL_COMMUNITY): Payer: Self-pay | Admitting: Interventional Radiology

## 2016-08-03 ENCOUNTER — Encounter (HOSPITAL_COMMUNITY): Payer: Self-pay | Admitting: Interventional Radiology

## 2016-08-03 ENCOUNTER — Other Ambulatory Visit (HOSPITAL_COMMUNITY): Payer: Self-pay | Admitting: Adult Health

## 2016-08-03 ENCOUNTER — Other Ambulatory Visit (HOSPITAL_COMMUNITY): Payer: Self-pay | Admitting: Internal Medicine

## 2016-08-03 ENCOUNTER — Other Ambulatory Visit (HOSPITAL_COMMUNITY): Payer: Self-pay | Admitting: *Deleted

## 2016-08-03 DIAGNOSIS — Y713 Surgical instruments, materials and cardiovascular devices (including sutures) associated with adverse incidents: Secondary | ICD-10-CM | POA: Diagnosis not present

## 2016-08-03 DIAGNOSIS — I5022 Chronic systolic (congestive) heart failure: Secondary | ICD-10-CM

## 2016-08-03 DIAGNOSIS — T82524A Displacement of infusion catheter, initial encounter: Secondary | ICD-10-CM | POA: Insufficient documentation

## 2016-08-03 HISTORY — PX: IR GENERIC HISTORICAL: IMG1180011

## 2016-08-03 MED ORDER — HEPARIN SOD (PORK) LOCK FLUSH 100 UNIT/ML IV SOLN
INTRAVENOUS | Status: AC
  Filled 2016-08-03: qty 5

## 2016-08-03 MED ORDER — LIDOCAINE HCL 1 % IJ SOLN
INTRAMUSCULAR | Status: AC
  Filled 2016-08-03: qty 20

## 2016-08-03 MED ORDER — CHLORHEXIDINE GLUCONATE 4 % EX LIQD
CUTANEOUS | Status: AC
  Filled 2016-08-03: qty 15

## 2016-08-03 NOTE — Procedures (Signed)
Interventional Radiology Procedure Note  Procedure:  Right arm PICC exchange  Complications:  None  Estimated Blood Loss: < 10 mL  New 40 cm Power PICC placed over wire.  Tip in distal SVC.  Venetia Night. Kathlene Cote, M.D Pager:  [phone]

## 2016-08-03 NOTE — Telephone Encounter (Signed)
Pt refused PIV or ER visit last night.  sch pt for new picc placement today at 11 am, pt aware and agreeable.  Carolynn Sayers, RN aware.

## 2016-08-04 ENCOUNTER — Telehealth: Payer: Self-pay | Admitting: *Deleted

## 2016-08-04 ENCOUNTER — Encounter: Payer: Self-pay | Admitting: *Deleted

## 2016-08-04 ENCOUNTER — Telehealth (HOSPITAL_COMMUNITY): Payer: Self-pay | Admitting: Cardiology

## 2016-08-04 ENCOUNTER — Telehealth (HOSPITAL_COMMUNITY): Payer: Self-pay

## 2016-08-04 DIAGNOSIS — I5022 Chronic systolic (congestive) heart failure: Secondary | ICD-10-CM

## 2016-08-04 MED ORDER — POTASSIUM CHLORIDE CRYS ER 20 MEQ PO TBCR: 40.0000 meq | EXTENDED_RELEASE_TABLET | Freq: Two times a day (BID) | ORAL | 1 refills | Status: DC

## 2016-08-04 MED ORDER — LOSARTAN POTASSIUM 25 MG PO TABS: 12.5000 mg | ORAL_TABLET | Freq: Two times a day (BID) | ORAL | 1 refills | Status: DC

## 2016-08-04 MED ORDER — SENNA 8.6 MG PO TABS: 2.0000 | ORAL_TABLET | Freq: Every evening | ORAL | 0 refills | Status: DC | PRN

## 2016-08-04 MED ORDER — SPIRONOLACTONE 25 MG PO TABS: 25.0000 mg | ORAL_TABLET | Freq: Every day | ORAL | 1 refills | Status: DC

## 2016-08-04 NOTE — Telephone Encounter (Signed)
Patient requesting refill on spironolactone to [place].  Renee Pain, RN

## 2016-08-04 NOTE — Telephone Encounter (Signed)
[name] returned my call.  He has an appointment with Dr. Haroldine Laws tomorrow.  He is going to talk to him about resuming or starting over.  He will call and let us know what he wants to do.

## 2016-08-04 NOTE — Telephone Encounter (Signed)
Patients [facility] called during home visit today with concerns regarding b/p. Reports multiple low b/p readings (will fax [facility] report today for review) Automatic cuff 76/50 Manual cuff SBP 80  Patient does report occasional dizziness.  Also reports rash at PICC site, replaced 08/03/16 after being pulled out 10cm. Nurse requests rx for flonase nasal spray to use at picc site.  Also request refill on senna  Abnormal lab results review with [place] drawn 10/28 k 3.5 cr 1.30 Per Rebecca Eaton patient should increase K to 40 meq BID     Per VO Oda Kilts, [place] Patient should report to office for b/p check and picc site evaluation. Hold PM dose of losartan 11/1, decrease losartan to 12.5 mg bid thereafter. Unable to give verbal for flonase, will further evaluate during visit. HH RN was unable to get blood cx's at the time of home visit

## 2016-08-04 NOTE — Telephone Encounter (Signed)
Called to check on status. LM on VM.  Per office note pt is cleared to return as of 10/27.  Today there was a telephone note about low blood pressures and rash around PICC line.

## 2016-08-05 ENCOUNTER — Ambulatory Visit (HOSPITAL_COMMUNITY)
Admission: RE | Admit: 2016-08-05 | Discharge: 2016-08-05 | Disposition: A | Discharge status: Home / Self Care | Payer: Medicare HMO | Source: Ambulatory Visit | Attending: Internal Medicine | Admitting: Internal Medicine

## 2016-08-05 ENCOUNTER — Encounter: Payer: Medicare HMO | Attending: Internal Medicine

## 2016-08-05 DIAGNOSIS — Z95810 Presence of automatic (implantable) cardiac defibrillator: Secondary | ICD-10-CM | POA: Insufficient documentation

## 2016-08-05 DIAGNOSIS — I5022 Chronic systolic (congestive) heart failure: Secondary | ICD-10-CM | POA: Insufficient documentation

## 2016-08-05 DIAGNOSIS — Z87891 Personal history of nicotine dependence: Secondary | ICD-10-CM | POA: Insufficient documentation

## 2016-08-05 DIAGNOSIS — Z0130 Encounter for examination of blood pressure without abnormal findings: Secondary | ICD-10-CM | POA: Insufficient documentation

## 2016-08-05 LAB — NICOTINE/COTININE METABOLITES
Cotinine: 214 ng/mL
NICOTINE: 6.9 ng/mL

## 2016-08-06 ENCOUNTER — Other Ambulatory Visit (HOSPITAL_COMMUNITY): Payer: Self-pay | Admitting: *Deleted

## 2016-08-06 MED ORDER — SENNA 8.6 MG PO TABS: 2.0000 | ORAL_TABLET | Freq: Every evening | ORAL | 0 refills | Status: DC | PRN

## 2016-08-09 ENCOUNTER — Encounter: Payer: Self-pay | Admitting: Internal Medicine

## 2016-08-10 ENCOUNTER — Other Ambulatory Visit (HOSPITAL_COMMUNITY): Payer: Self-pay | Admitting: *Deleted

## 2016-08-10 DIAGNOSIS — I5022 Chronic systolic (congestive) heart failure: Secondary | ICD-10-CM

## 2016-08-10 MED ORDER — ISOSORBIDE MONONITRATE ER 30 MG PO TB24: 30.0000 mg | ORAL_TABLET | Freq: Every day | ORAL | 3 refills | Status: DC

## 2016-08-10 MED ORDER — SPIRONOLACTONE 25 MG PO TABS: 25.0000 mg | ORAL_TABLET | Freq: Every day | ORAL | 1 refills | Status: DC

## 2016-08-10 MED ORDER — POTASSIUM CHLORIDE CRYS ER 20 MEQ PO TBCR: 40.0000 meq | EXTENDED_RELEASE_TABLET | Freq: Two times a day (BID) | ORAL | 1 refills | Status: DC

## 2016-08-11 ENCOUNTER — Encounter: Payer: Self-pay | Admitting: *Deleted

## 2016-08-11 ENCOUNTER — Telehealth (HOSPITAL_COMMUNITY): Payer: Self-pay | Admitting: *Deleted

## 2016-08-11 DIAGNOSIS — I5022 Chronic systolic (congestive) heart failure: Secondary | ICD-10-CM

## 2016-08-11 NOTE — Progress Notes (Signed)
Cardiac Individual Treatment Plan  Patient Details  Name: Ronald Miller. MRN: [id number] Date of Birth: 1956-04-24 Referring Provider:   Flowsheet Row Cardiac Rehab from 05/24/2016 in [facility] Cardiac and Pulmonary Rehab  Referring Provider  Glori Bickers MD      Initial Encounter Date:  Flowsheet Row Cardiac Rehab from 05/24/2016 in [facility] Cardiac and Pulmonary Rehab  Date  05/24/16  Referring Provider  Glori Bickers MD      Visit Diagnosis: Heart failure, chronic systolic ([place])  Patient's Home Medications on Admission:  Current Outpatient Prescriptions:  .  acetaminophen (TYLENOL) 500 MG tablet, Take 1,000 mg by mouth every 6 (six) hours as needed for mild pain or headache., Disp: , Rfl:  .  albuterol (PROVENTIL HFA;VENTOLIN HFA) 108 (90 BASE) MCG/ACT inhaler, Inhale 2 puffs into the lungs every 6 (six) hours as needed for wheezing or shortness of breath., Disp: , Rfl:  .  aspirin EC 81 MG tablet, Take 81 mg by mouth every morning. , Disp: , Rfl:  .  atorvastatin (LIPITOR) 20 MG tablet, Take 20 mg by mouth daily. , Disp: , Rfl:  .  baclofen (LIORESAL) 10 MG tablet, Take 10 mg by mouth 3 (three) times daily as needed for muscle spasms., Disp: , Rfl:  .  digoxin (LANOXIN) 0.25 MG tablet, TAKE 1 TABLET EVERY DAY, Disp: 90 tablet, Rfl: 1 .  escitalopram (LEXAPRO) 20 MG tablet, Take 1 tablet (20 mg total) by mouth daily., Disp: 30 tablet, Rfl: 0 .  insulin aspart (NOVOLOG) 100 UNIT/ML injection, Inject 10 Units into the skin 3 (three) times daily before meals., Disp: , Rfl:  .  insulin glargine (LANTUS) 100 UNIT/ML injection, Inject 40 Units into the skin at bedtime., Disp: , Rfl:  .  isosorbide mononitrate (IMDUR) 30 MG 24 hr tablet, Take 1 tablet (30 mg total) by mouth daily., Disp: 90 tablet, Rfl: 3 .  losartan (COZAAR) 25 MG tablet, Take 0.5 tablets (12.5 mg total) by mouth 2 (two) times daily., Disp: 180 tablet, Rfl: 1 .  metolazone (ZAROXOLYN) 5 MG tablet, Take 5 mg by mouth  as needed (if weight >229 lb)., Disp: , Rfl:  .  metoprolol succinate (TOPROL-XL) 100 MG 24 hr tablet, Take 1 tablet (100 mg total) by mouth daily. Take with or immediately following a meal., Disp: 90 tablet, Rfl: 3 .  milrinone (PRIMACOR) 20 MG/100 ML SOLN infusion, Inject 41.025 mcg/min into the vein continuous., Disp: 1000 mL, Rfl: 0 .  Multiple Vitamins-Minerals (SENTRY ADULT) TABS, Take 1 tablet by mouth daily., Disp: , Rfl:  .  Na Sulfate-K Sulfate-Mg Sulf 17.5-3.13-1.6 GM/180ML SOLN, Take 1 kit by mouth as directed., Disp: 354 mL, Rfl: 0 .  nicotine (NICODERM CQ - DOSED IN MG/24 HOURS) 21 mg/24hr patch, Place 1 patch (21 mg total) onto the skin daily., Disp: 28 patch, Rfl: 0 .  Omega-3 Fatty Acids (FISH OIL) 1000 MG CAPS, Take 1,000 mg by mouth daily., Disp: , Rfl:  .  potassium chloride SA (K-DUR,KLOR-CON) 20 MEQ tablet, Take 2 tablets (40 mEq total) by mouth 2 (two) times daily., Disp: 360 tablet, Rfl: 1 .  senna (SENOKOT) 8.6 MG TABS tablet, Take 2 tablets (17.2 mg total) by mouth at bedtime as needed. Contact PCP for further refills, this is a courtesy refill, Disp: 180 each, Rfl: 0 .  spironolactone (ALDACTONE) 25 MG tablet, Take 1 tablet (25 mg total) by mouth daily., Disp: 90 tablet, Rfl: 1 .  torsemide (DEMADEX) 20 MG tablet, Take  2 tablets (40 mg total) by mouth 2 (two) times daily., Disp: 360 tablet, Rfl: 3 .  zolpidem (AMBIEN) 5 MG tablet, Take 1 tablet (5 mg total) by mouth at bedtime as needed for sleep., Disp: 30 tablet, Rfl: 0  Past Medical History: Past Medical History:  Diagnosis Date  . AICD (automatic cardioverter/defibrillator) present   . ASCVD (arteriosclerotic cardiovascular disease)   . Chronic systolic CHF (congestive heart failure) ([place])   . COPD (chronic obstructive pulmonary disease) ([place])   . Coronary artery disease   . Depression   . Diabetes mellitus   . History of cocaine abuse   . Hypertension   . Shortness of breath dyspnea   . Suicidal ideation   .  Tobacco abuse     Tobacco Use: History  Smoking Status  . Former Smoker  . Packs/day: 1.50  . Years: 23.00  . Types: Cigarettes  . Quit date: 08/27/2015  Smokeless Tobacco  . Never Used    Labs: Recent Review Flowsheet Data    Labs for ITP Cardiac and Pulmonary Rehab Latest Ref Rng & Units 03/23/2016 03/24/2016 03/25/2016 06/23/2016 06/29/2016   Cholestrol 0 - 200 mg/dL - - - - -   LDLCALC 0 - 100 mg/dL - - - - -   HDL 40 - 60 mg/dL - - - - -   Trlycerides 0 - 200 mg/dL - - - - -   Hemoglobin A1c 4.8 - 5.6 % - - - - -   PHART 7.350 - 7.450 - - - - -   PCO2ART 35.0 - 45.0 mmHg - - - - -   HCO3 20.0 - 24.0 mEq/L - - - - -   TCO2 0 - 100 mmol/L - - - - -   O2SAT % 58.4 61.6 59.8 55.4 50.0       Exercise Target Goals:    Exercise Program Goal: Individual exercise prescription set with THRR, safety & activity barriers. Participant demonstrates ability to understand and report RPE using BORG scale, to self-measure pulse accurately, and to acknowledge the importance of the exercise prescription.  Exercise Prescription Goal: Starting with aerobic activity 30 plus minutes a day, 3 days per week for initial exercise prescription. Provide home exercise prescription and guidelines that participant acknowledges understanding prior to discharge.  Activity Barriers & Risk Stratification:     Activity Barriers & Cardiac Risk Stratification - 05/24/16 1516      Activity Barriers & Cardiac Risk Stratification   Activity Barriers Back Problems;Shortness of Breath   Cardiac Risk Stratification High      6 Minute Walk:     6 Minute Walk    Row Name 05/24/16 1343         6 Minute Walk   Phase Initial     Distance 1200 feet     Walk Time 6 minutes     # of Rest Breaks 0     MPH 2.27     METS 3.09     RPE 13     VO2 Peak 10.82     Symptoms Yes (comment)     Comments muscles in chest (intercostals) and calves sore, ankles were sore     Resting HR 81 bpm     Resting BP 122/64      Max Ex. HR 109 bpm     Max Ex. BP 126/62     2 Minute Post BP 126/64       Interval Oxygen   Interval Oxygen?  Yes     Baseline Oxygen Saturation % 94 %     Baseline Liters of Oxygen 0 L  Room Air     1 Minute Oxygen Saturation % 92 %     1 Minute Liters of Oxygen 0 L     2 Minute Oxygen Saturation % 92 %     2 Minute Liters of Oxygen 0 L     3 Minute Oxygen Saturation % 93 %     3 Minute Liters of Oxygen 0 L     4 Minute Oxygen Saturation % 94 %     4 Minute Liters of Oxygen 0 L     5 Minute Oxygen Saturation % 94 %     5 Minute Liters of Oxygen 0 L     6 Minute Oxygen Saturation % 94 %     6 Minute Liters of Oxygen 0 L     2 Minute Post Oxygen Saturation % 97 %     2 Minute Post Liters of Oxygen 0 L        Initial Exercise Prescription:     Initial Exercise Prescription - 05/24/16 1300      Date of Initial Exercise RX and Referring Provider   Date 05/24/16   Referring Provider Glori Bickers MD     Treadmill   MPH 2   Grade 1   Minutes 15   METs 2.81     NuStep   Level 3  80-100 spm   Minutes 15   METs 2     Recumbant Elliptical   Level 2   RPM 50   Minutes 15   METs 2     Prescription Details   Frequency (times per week) 2   Duration Progress to 45 minutes of aerobic exercise without signs/symptoms of physical distress     Intensity   THRR 40-80% of Max Heartrate 113-145   Ratings of Perceived Exertion 11-13   Perceived Dyspnea 0-4     Progression   Progression Continue to progress workloads to maintain intensity without signs/symptoms of physical distress.     Resistance Training   Training Prescription Yes   Weight 3 lbs   Reps 10-15      Perform Capillary Blood Glucose checks as needed.  Exercise Prescription Changes:     Exercise Prescription Changes    Row Name 06/09/16 1400 06/23/16 1400           Exercise Review   Progression -  From First full day of exercise 8/24 -  not a full session        Response to  Exercise   Blood Pressure (Admit) 124/60 120/60      Blood Pressure (Exercise) 126/74  -      Blood Pressure (Exit) 114/50 120/70      Heart Rate (Admit) 54 bpm 70 bpm      Heart Rate (Exercise) 109 bpm 106 bpm      Heart Rate (Exit) 72 bpm 73 bpm      Rating of Perceived Exertion (Exercise) 15 13      Symptoms none coughing      Duration Progress to 45 minutes of aerobic exercise without signs/symptoms of physical distress Progress to 45 minutes of aerobic exercise without signs/symptoms of physical distress      Intensity THRR unchanged THRR unchanged        Progression   Progression Continue to progress workloads to maintain intensity without signs/symptoms of physical distress.  Continue to progress workloads to maintain intensity without signs/symptoms of physical distress.      Average METs 1.92 2.3        Resistance Training   Training Prescription Yes  -      Weight 3 lbs  -      Reps 10-15  -        Interval Training   Interval Training No  -        Treadmill   MPH 1.4  -      Grade 0.5  -      Minutes 15  -      METs 2.17  -        NuStep   Level 3 3      Minutes 15 15      METs 2.2 2.3        Recumbant Elliptical   Level 2  -      RPM 50  -      Minutes 15  -      METs 1.4  -         Exercise Comments:     Exercise Comments    Row Name 05/27/16 1057 06/09/16 1448 06/17/16 1026 06/23/16 1433 07/20/16 1515   Exercise Comments First full day of exercise!  Patient was oriented to gym and equipment including functions, settings, policies, and procedures.  Patient's individual exercise prescription and treatment plan were reviewed.  All starting workloads were established based on the results of the 6 minute walk test done at initial orientation visit.  The plan for exercise progression was also introduced and progression will be customized based on patient's performance and goals.  [name] did need to reduce workloads on the treadmill today for his first day. He also  had a dizzy spell when he finished the treadmill as his blood pressure had dropped.  He was given water and recovered; he was able to finish the rest of the class without symptoms.  His blood sugar dropped during exercise as he only had cantaloupe and watermelon for breakfast.  He was given PB crackers and encouraged to eat protein for breakfast. [name] has been out with cellulitis since last vist on 8/24. Gunnar Fusi. did not complete his rehab session.  He was sent home early after coughing up green phlegm.  He is going to try the 4pm class on Monday. [name] has been out since last review with bronchitis.  He was seen in office today and cleared to return to rehab. Out since last review.      Discharge Exercise Prescription (Final Exercise Prescription Changes):     Exercise Prescription Changes - 06/23/16 1400      Exercise Review   Progression --  not a full session     Response to Exercise   Blood Pressure (Admit) 120/60   Blood Pressure (Exit) 120/70   Heart Rate (Admit) 70 bpm   Heart Rate (Exercise) 106 bpm   Heart Rate (Exit) 73 bpm   Rating of Perceived Exertion (Exercise) 13   Symptoms coughing   Duration Progress to 45 minutes of aerobic exercise without signs/symptoms of physical distress   Intensity THRR unchanged     Progression   Progression Continue to progress workloads to maintain intensity without signs/symptoms of physical distress.   Average METs 2.3     NuStep   Level 3   Minutes 15   METs 2.3      Nutrition:  Target Goals:  Understanding of nutrition guidelines, daily intake of sodium <1538m, cholesterol <2018m calories 30% from fat and 7% or less from saturated fats, daily to have 5 or more servings of fruits and vegetables.  Biometrics:    Nutrition Therapy Plan and Nutrition Goals:     Nutrition Therapy & Goals - 05/24/16 1521      Nutrition Therapy   Drug/Food Interactions Statins/Certain Fruits     Intervention Plan   Intervention  Prescribe, educate and counsel regarding individualized specific dietary modifications aiming towards targeted core components such as weight, hypertension, lipid management, diabetes, heart failure and other comorbidities.   Expected Outcomes Short Term Goal: Understand basic principles of dietary content, such as calories, fat, sodium, cholesterol and nutrients.;Long Term Goal: Adherence to prescribed nutrition plan.;Short Term Goal: A plan has been developed with personal nutrition goals set during dietitian appointment.      Nutrition Discharge: Rate Your Plate Scores:   Nutrition Goals Re-Evaluation:   Psychosocial: Target Goals: Acknowledge presence or absence of depression, maximize coping skills, provide positive support system. Participant is able to verbalize types and ability to use techniques and skills needed for reducing stress and depression.  Initial Review & Psychosocial Screening:     Initial Psych Review & Screening - 05/24/16 1522      Initial Review   Current issues with History of Depression     Family Dynamics   Good Support System? Yes   Comments History of depression is listed in [name]'s history. When I mentioned that he said what doctor wrote that -I don't have that.       Quality of Life Scores:     Quality of Life - 05/24/16 1856      Quality of Life Scores   Health/Function Pre 19.7 %   Socioeconomic Pre 18.43 %   Psych/Spiritual Pre 26.79 %   Family Pre 20.4 %   GLOBAL Pre 21 %      PHQ-9: Recent Review Flowsheet Data    [facility] 05/24/2016 02/03/2016 11/06/2015   Decreased Interest 1 0 1   Down, Depressed, Hopeless 0 0 2   PHQ - 2 Score 1 0 3   Altered sleeping 2 - -   Tired, decreased energy 1 - -   Change in appetite 0 - -   Feeling bad or failure about yourself  1 - -   Trouble concentrating 0 - -   Moving slowly or fidgety/restless 2 - -   Suicidal thoughts 0 - 0   PHQ-9 Score 7 - -   Difficult doing work/chores Not  difficult at all - -      Psychosocial Evaluation and Intervention:   Psychosocial Re-Evaluation:     Psychosocial Re-Evaluation    Row Name 06/01/16 [phone]           Psychosocial Re-Evaluation   Comments MeFrancescoalled and said he is sorry that he is not here in Cardiac Rehab today. He is afraid they are going to put him in the hospital due to cellulitis in his legs. Added stress/           Vocational Rehabilitation: Provide vocational rehab assistance to qualifying candidates.   Vocational Rehab Evaluation & Intervention:     Vocational Rehab - 05/24/16 1309      Initial Vocational Rehab Evaluation & Intervention   Assessment shows need for Vocational Rehabilitation No      Education: Education Goals: Education classes will be provided on a weekly  basis, covering required topics. Participant will state understanding/return demonstration of topics presented.  Learning Barriers/Preferences:     Learning Barriers/Preferences - 05/24/16 1516      Learning Barriers/Preferences   Learning Barriers None   Learning Preferences None      Education Topics: General Nutrition Guidelines/Fats and Fiber: -Group instruction provided by verbal, written material, models and posters to present the general guidelines for heart healthy nutrition. Gives an explanation and review of dietary fats and fiber.   Controlling Sodium/Reading Food Labels: -Group verbal and written material supporting the discussion of sodium use in heart healthy nutrition. Review and explanation with models, verbal and written materials for utilization of the food label.   Exercise Physiology & Risk Factors: - Group verbal and written instruction with models to review the exercise physiology of the cardiovascular system and associated critical values. Details cardiovascular disease risk factors and the goals associated with each risk factor.   Aerobic Exercise & Resistance Training: - Gives group  verbal and written discussion on the health impact of inactivity. On the components of aerobic and resistive training programs and the benefits of this training and how to safely progress through these programs.   Flexibility, Balance, General Exercise Guidelines: - Provides group verbal and written instruction on the benefits of flexibility and balance training programs. Provides general exercise guidelines with specific guidelines to those with heart or lung disease. Demonstration and skill practice provided.   Stress Management: - Provides group verbal and written instruction about the health risks of elevated stress, cause of high stress, and healthy ways to reduce stress.   Depression: - Provides group verbal and written instruction on the correlation between heart/lung disease and depressed mood, treatment options, and the stigmas associated with seeking treatment.   Anatomy & Physiology of the Heart: - Group verbal and written instruction and models provide basic cardiac anatomy and physiology, with the coronary electrical and arterial systems. Review of: AMI, Angina, Valve disease, Heart Failure, Cardiac Arrhythmia, Pacemakers, and the ICD.   Cardiac Procedures: - Group verbal and written instruction and models to describe the testing methods done to diagnose heart disease. Reviews the outcomes of the test results. Describes the treatment choices: Medical Management, Angioplasty, or Coronary Bypass Surgery.   Cardiac Medications: - Group verbal and written instruction to review commonly prescribed medications for heart disease. Reviews the medication, class of the drug, and side effects. Includes the steps to properly store meds and maintain the prescription regimen.   Go Sex-Intimacy & Heart Disease, Get SMART - Goal Setting: - Group verbal and written instruction through game format to discuss heart disease and the return to sexual intimacy. Provides group verbal and written  material to discuss and apply goal setting through the application of the S.M.A.R.T. Method.   Other Matters of the Heart: - Provides group verbal, written materials and models to describe Heart Failure, Angina, Valve Disease, and Diabetes in the realm of heart disease. Includes description of the disease process and treatment options available to the cardiac patient.   Exercise & Equipment Safety: - Individual verbal instruction and demonstration of equipment use and safety with use of the equipment. Flowsheet Row Cardiac Rehab from 05/24/2016 in [facility] Cardiac and Pulmonary Rehab  Date  05/24/16  Educator  C. [place]  Instruction Review Code  1- partially meets, needs review/practice      Infection Prevention: - Provides verbal and written material to individual with discussion of infection control including proper hand washing and proper equipment cleaning  during exercise session. Flowsheet Row Cardiac Rehab from 05/24/2016 in [facility] Cardiac and Pulmonary Rehab  Date  05/24/16  Educator  C. [place]  Instruction Review Code  1- partially meets, needs review/practice      Falls Prevention: - Provides verbal and written material to individual with discussion of falls prevention and safety. Flowsheet Row Cardiac Rehab from 05/24/2016 in [facility] Cardiac and Pulmonary Rehab  Date  05/24/16  Educator  C. [place]  Instruction Review Code  2- meets goals/outcomes      Diabetes: - Individual verbal and written instruction to review signs/symptoms of diabetes, desired ranges of glucose level fasting, after meals and with exercise. Advice that pre and post exercise glucose checks will be done for 3 sessions at entry of program. [place] from 05/24/2016 in [facility] Cardiac and Pulmonary Rehab  Date  05/24/16  Educator  C. [place]  Instruction Review Code  1- partially meets, needs review/practice       Knowledge Questionnaire Score:     Knowledge Questionnaire  Score - 05/24/16 1305      Knowledge Questionnaire Score   Pre Score 24      Core Components/Risk Factors/Patient Goals at Admission:     Personal Goals and Risk Factors at Admission - 05/24/16 1521      Core Components/Risk Factors/Patient Goals on Admission    Weight Management Yes;Weight Maintenance;Weight Loss   Intervention Weight Management: Develop a combined nutrition and exercise program designed to reach desired caloric intake, while maintaining appropriate intake of nutrient and fiber, sodium and fats, and appropriate energy expenditure required for the weight goal.;Weight Management: Provide education and appropriate resources to help participant work on and attain dietary goals.   Hypertension Yes   Intervention Provide education on lifestyle modifcations including regular physical activity/exercise, weight management, moderate sodium restriction and increased consumption of fresh fruit, vegetables, and low fat dairy, alcohol moderation, and smoking cessation.;Monitor prescription use compliance.   Expected Outcomes Short Term: Continued assessment and intervention until BP is < 140/70m HG in hypertensive participants. < 130/854mHG in hypertensive participants with diabetes, heart failure or chronic kidney disease.;Long Term: Maintenance of blood pressure at goal levels.      Core Components/Risk Factors/Patient Goals Review:      Goals and Risk Factor Review    Row Name 06/01/16 [phone]           Core Components/Risk Factors/Patient Goals Review   Review MeLennoxalled and said he is sorry that he is not here in Cardiac Rehab today. He is afraid they are going to put him in the hospital due to cellulitis in his legs.           Core Components/Risk Factors/Patient Goals at Discharge (Final Review):      Goals and Risk Factor Review - 06/01/16 0947      Core Components/Risk Factors/Patient Goals Review   Review MeZebulinalled and said he is sorry that he is not  here in Cardiac Rehab today. He is afraid they are going to put him in the hospital due to cellulitis in his legs.       ITP Comments:     ITP Comments    Row Name 05/24/16 1519 05/24/16 1523 06/01/16 0942 06/16/16 0644 07/09/16 1314   ITP Comments "[name]" is interested in getting his mother to exercise with him since she has Silver SnSocial research officer, governmentA Cardiac Rehab REgistered dietician appt was made for [name] since he said he has problems what to  eat with his diabetes and now heart problems with a 2 liter fluid restriction/day. History of depression is listed in [name]'s history. When I mentioned that he said what doctor wrote that -I don't have that.  Savva called and said he is sorry that he is not here in Cardiac Rehab today. He is afraid they are going to put him in the hospital due to cellulitis in his legs.  30 day review. Continue with ITP unless changes noted by Medical Director at signature of review. Called to check on status.  Out with testing and work up for possible heart transplant.  Hopes to return to 4pm class on Monday.   Row Name 07/14/16 0165 07/20/16 1515 08/04/16 1424 08/11/16 0705     ITP Comments 30 day review. Continue with ITP unless changes noted by Medical Director at signature of review. Has been out since 9/14 Called to check on status. LMOM Called to check on status. LM on VM.  Per office note pt is cleared to return as of 10/27.  Today there was a telephone note about low blood pressures and rash around PICC line. 30 day review completed for Medical Director physician review and signature. Continue ITP unless changes made by physician. last visit 06/17/2016       Comments:

## 2016-08-11 NOTE — Telephone Encounter (Signed)
HHRN called to report pt had increased Torsemide for increased wt and is doing better today.  She would also like order for CSW, provided VO

## 2016-08-12 ENCOUNTER — Telehealth (HOSPITAL_COMMUNITY): Payer: Self-pay | Admitting: Cardiology

## 2016-08-12 MED ORDER — POTASSIUM CHLORIDE CRYS ER 20 MEQ PO TBCR: 40.0000 meq | EXTENDED_RELEASE_TABLET | Freq: Three times a day (TID) | ORAL | 1 refills | Status: DC

## 2016-08-12 NOTE — Telephone Encounter (Signed)
Abnormal labs drawn with [facility] on 08/05/16 k 3.4   Per VO Amy Clegg,NP Patient should increase potassium to 40 meq three times per day   Patient aware and voiced understanding

## 2016-08-13 ENCOUNTER — Telehealth (HOSPITAL_COMMUNITY): Payer: Self-pay | Admitting: *Deleted

## 2016-08-13 NOTE — Telephone Encounter (Signed)
Vaughan Basta stated pts weight was up 2.6lbs yesterday but patient did not take extra torsemide on wednesday.  Today pts weight is up again for a total of 5.1lbs in 2 days. He did not take an extra torsemide Thursday. Today he took a metolazone with extra potassium. Vaughan Basta will check patients weight tomorrow if his weight has not gone down she will continue with the diuretic protocol.

## 2016-08-14 ENCOUNTER — Telehealth: Payer: Self-pay | Admitting: Nurse Practitioner

## 2016-08-14 NOTE — Telephone Encounter (Deleted)
   Ronald Miller, [name], called to report that pts wt is up another 0.5 lbs today.  Prior notes reviewed.  He was up 2.1 lbs above dry wt on 11/9, 5.1 lbs on 11/10, and now 5.6 lbs.  He did take metolazone on 11/10.  B/c wt went up despite metolazone, I rec that she give lasix 80 IV x one this AM w/ an additional 20 meq of kdur.  He will take his regularly scheduled dose of po torsemide this afternoon.  Caller verbalized understanding and was grateful for the call back and will f/u with him tomorrow.  [name], NP 08/14/2016, 9:53 AM

## 2016-08-14 NOTE — Telephone Encounter (Signed)
   Ronald Miller, [place], called to report that pts wt is up another 0.5 lbs today.  Prior notes reviewed.  He was up 2.1 lbs above dry wt on 11/9, 5.1 lbs on 11/10, and now 5.6 lbs.  He did take metolazone on 11/10.  B/c wt went up despite metolazone, I rec that she give lasix 80 IV x one this AM w/ an additional 20 meq of kdur.  He will take his regularly scheduled dose of po torsemide this afternoon.  Caller verbalized understanding and was grateful for the call back and will f/u with him tomorrow.  [name], NP 08/14/2016, 9:53 AM

## 2016-08-15 ENCOUNTER — Telehealth: Payer: Self-pay | Admitting: Nurse Practitioner

## 2016-08-15 NOTE — Telephone Encounter (Signed)
   Vaughan Basta, [place], called to report that pts wt is stable today, though still up 5.6 lbs overall.  Pt is stable and w/o complaint.  It turns out that a few days ago, he had eaten [place] sausage (thought it was "healthy") and yesterday he ate smoked [place].  I rec that he take metolazone this AM with 60 mg of torsemide and then his usual 40 or torsemide this afternoon.  HHRN is checking labs today.  She does not have any IV lasix on hand today.  Caller verbalized understanding and was grateful for the call back.  [name], NP 08/15/2016, 8:53 AM

## 2016-08-16 ENCOUNTER — Telehealth (HOSPITAL_COMMUNITY): Payer: Self-pay | Admitting: Surgery

## 2016-08-16 ENCOUNTER — Telehealth: Payer: Self-pay | Admitting: Internal Medicine

## 2016-08-16 NOTE — Telephone Encounter (Signed)
I received a call from Ronald Miller for Ronald Miller.  She tells me that his weight is up again today another pound to 224lbs.  Per Ronald Miller his labs from yesterday show Creat- 1.24 and K-3.9.  He took 80 IV Lasix on Saturday and extra dose of Metolazone on Sunday.  I advised he that I would check with MD and call her back shortly.  I spoke with Ronald Kilts PA and because Ronald Miller is not experiencing any increased SOB the decision was made to monitor him for now.  I did advise Ronald Miller and Ronald Miller to call us back with any increased swelling or SOB.  They are in agreement with this plan.

## 2016-08-16 NOTE — Telephone Encounter (Signed)
Explained to pt that the prep script was sent in to the pharmacy in October. Pt to call back if he has any trouble at the pharmacy.

## 2016-08-17 NOTE — Progress Notes (Signed)
No answer on home phone, no voice mail.  I left a message on cell phone to arrive at 1030 to Main Entrance, to complete prep as instructed.  Take 1/2 of Insulin tonight as instructed by Dr Vena Rua office.  Mediaction list has not been confirmed by Pharmacy, I instructed patient to take the following medications with a sip of water if he is still taking them: Digoxin, Isosorbide, Metoprolol. I instructed patient where to arrive and Endo's phone number if he has questions or problems.  I instructed patient to check CBG  And if it is less than 70 to call Endo.  I Spoke with Dr Kalman Shan about patient history,no new orders.

## 2016-08-18 ENCOUNTER — Encounter: Payer: Self-pay | Admitting: Internal Medicine

## 2016-08-18 ENCOUNTER — Encounter (HOSPITAL_COMMUNITY): Payer: Self-pay | Admitting: *Deleted

## 2016-08-18 ENCOUNTER — Encounter (HOSPITAL_COMMUNITY)
Admission: RE | Disposition: A | Discharge status: Home / Self Care | Payer: Self-pay | Source: Ambulatory Visit | Attending: Internal Medicine

## 2016-08-18 ENCOUNTER — Ambulatory Visit (HOSPITAL_COMMUNITY): Payer: Medicare HMO | Admitting: Anesthesiology

## 2016-08-18 ENCOUNTER — Ambulatory Visit (HOSPITAL_COMMUNITY)
Admission: RE | Admit: 2016-08-18 | Discharge: 2016-08-18 | Disposition: A | Discharge status: Home / Self Care | Payer: Medicare HMO | Source: Ambulatory Visit | Attending: Internal Medicine | Admitting: Internal Medicine

## 2016-08-18 DIAGNOSIS — K648 Other hemorrhoids: Secondary | ICD-10-CM | POA: Insufficient documentation

## 2016-08-18 DIAGNOSIS — Z1212 Encounter for screening for malignant neoplasm of rectum: Secondary | ICD-10-CM | POA: Diagnosis not present

## 2016-08-18 DIAGNOSIS — I2510 Atherosclerotic heart disease of native coronary artery without angina pectoris: Secondary | ICD-10-CM | POA: Insufficient documentation

## 2016-08-18 DIAGNOSIS — K5730 Diverticulosis of large intestine without perforation or abscess without bleeding: Secondary | ICD-10-CM | POA: Insufficient documentation

## 2016-08-18 DIAGNOSIS — I110 Hypertensive heart disease with heart failure: Secondary | ICD-10-CM | POA: Diagnosis not present

## 2016-08-18 DIAGNOSIS — Z1211 Encounter for screening for malignant neoplasm of colon: Secondary | ICD-10-CM

## 2016-08-18 DIAGNOSIS — J449 Chronic obstructive pulmonary disease, unspecified: Secondary | ICD-10-CM | POA: Insufficient documentation

## 2016-08-18 DIAGNOSIS — Z95810 Presence of automatic (implantable) cardiac defibrillator: Secondary | ICD-10-CM | POA: Diagnosis not present

## 2016-08-18 DIAGNOSIS — Z8249 Family history of ischemic heart disease and other diseases of the circulatory system: Secondary | ICD-10-CM | POA: Diagnosis not present

## 2016-08-18 DIAGNOSIS — E785 Hyperlipidemia, unspecified: Secondary | ICD-10-CM | POA: Diagnosis not present

## 2016-08-18 DIAGNOSIS — Z87891 Personal history of nicotine dependence: Secondary | ICD-10-CM | POA: Diagnosis not present

## 2016-08-18 DIAGNOSIS — Z79899 Other long term (current) drug therapy: Secondary | ICD-10-CM | POA: Insufficient documentation

## 2016-08-18 DIAGNOSIS — I509 Heart failure, unspecified: Secondary | ICD-10-CM | POA: Insufficient documentation

## 2016-08-18 DIAGNOSIS — E669 Obesity, unspecified: Secondary | ICD-10-CM | POA: Insufficient documentation

## 2016-08-18 DIAGNOSIS — Z6833 Body mass index (BMI) 33.0-33.9, adult: Secondary | ICD-10-CM | POA: Diagnosis not present

## 2016-08-18 DIAGNOSIS — E119 Type 2 diabetes mellitus without complications: Secondary | ICD-10-CM | POA: Diagnosis not present

## 2016-08-18 DIAGNOSIS — Z794 Long term (current) use of insulin: Secondary | ICD-10-CM | POA: Diagnosis not present

## 2016-08-18 HISTORY — PX: COLONOSCOPY WITH PROPOFOL: SHX5780

## 2016-08-18 SURGERY — COLONOSCOPY WITH PROPOFOL
Anesthesia: Monitor Anesthesia Care

## 2016-08-18 MED ORDER — LACTATED RINGERS IV SOLN
INTRAVENOUS | Status: DC | PRN
  Administered 2016-08-18: 12:00:00 via INTRAVENOUS

## 2016-08-18 MED ORDER — PROPOFOL 10 MG/ML IV BOLUS
INTRAVENOUS | Status: DC | PRN
  Administered 2016-08-18: 20 mg via INTRAVENOUS
  Administered 2016-08-18: 30 mg via INTRAVENOUS
  Administered 2016-08-18: 20 mg via INTRAVENOUS
  Administered 2016-08-18 (×3): 30 mg via INTRAVENOUS
  Administered 2016-08-18 (×2): 20 mg via INTRAVENOUS

## 2016-08-18 MED ORDER — SODIUM CHLORIDE 0.9 % IV SOLN: INTRAVENOUS | Status: DC

## 2016-08-18 NOTE — Anesthesia Procedure Notes (Signed)
Procedure Name: MAC Date/Time: 08/18/2016 12:06 PM Performed by: Neldon Newport Pre-anesthesia Checklist: Timeout performed, Patient being monitored, Suction available, Emergency Drugs available and Patient identified Patient Re-evaluated:Patient Re-evaluated prior to inductionOxygen Delivery Method: Simple face mask Placement Confirmation: positive ETCO2

## 2016-08-18 NOTE — Anesthesia Postprocedure Evaluation (Signed)
Anesthesia Post Note  Patient: Ronald Miller.  Procedure(s) Performed: Procedure(s) (LRB): COLONOSCOPY WITH PROPOFOL (N/A)  Patient location during evaluation: PACU Anesthesia Type: MAC Level of consciousness: awake and alert and oriented Pain management: pain level controlled Vital Signs Assessment: post-procedure vital signs reviewed and stable Respiratory status: spontaneous breathing, nonlabored ventilation, respiratory function stable and patient connected to nasal cannula oxygen Cardiovascular status: stable and blood pressure returned to baseline Postop Assessment: no signs of nausea or vomiting Anesthetic complications: no    Last Vitals:  Vitals:   08/18/16 1243 08/18/16 1255  BP: 96/75 95/65  Pulse: 71 74  Resp: 18 18  Temp:      Last Pain:  Vitals:   08/18/16 1243  TempSrc: Oral                 Jaleeah Slight A.

## 2016-08-18 NOTE — Anesthesia Preprocedure Evaluation (Addendum)
Anesthesia Evaluation  Patient identified by MRN, date of birth, ID band Patient awake    Reviewed: Allergy & Precautions, NPO status , Patient's Chart, lab work & pertinent test results, reviewed documented beta blocker date and time   Airway Mallampati: III  TM Distance: >3 FB Neck ROM: Full    Dental  (+) Poor Dentition, Missing, Dental Advidsory Given   Pulmonary shortness of breath, with exertion, at rest, lying and Long-Term Oxygen Therapy, COPD, former smoker,    Pulmonary exam normal breath sounds clear to auscultation       Cardiovascular hypertension, Pt. on medications and Pt. on home beta blockers + CAD, + CABG and +CHF  Normal cardiovascular exam+ pacemaker + Cardiac Defibrillator  Rhythm:Regular Rate:Normal     Neuro/Psych PSYCHIATRIC DISORDERS Depression negative neurological ROS     GI/Hepatic Neg liver ROS, Screening colonoscopy   Endo/Other  diabetes, Well Controlled, Type 2, Insulin Dependent, Oral Hypoglycemic AgentsObesity Hyperlipidemia  Renal/GU negative Renal ROS  negative genitourinary   Musculoskeletal negative musculoskeletal ROS (+)   Abdominal (+) + obese,   Peds  Hematology negative hematology ROS (+)   Anesthesia Other Findings   Reproductive/Obstetrics                              Chemistry      Component Value Date/Time   NA 138 06/23/2016 1245   NA 136 04/09/2014 0848   K 4.1 06/23/2016 1245   K 3.9 04/09/2014 0848   CL 99 (L) 06/23/2016 1245   CL 100 04/09/2014 0848   CO2 31 06/23/2016 1245   CO2 32 04/09/2014 0848   BUN 33 (H) 06/23/2016 1245   BUN 12 04/09/2014 0848   CREATININE 1.14 06/23/2016 1245   CREATININE 1.07 04/09/2014 0848      Component Value Date/Time   CALCIUM 9.8 06/23/2016 1245   CALCIUM 8.9 04/09/2014 0848   ALKPHOS 76 06/17/2016 1600   ALKPHOS 82 03/29/2014 2127   AST 22 06/17/2016 1600   AST 24 03/29/2014 2127   ALT 18  06/17/2016 1600   ALT 40 03/29/2014 2127   BILITOT 1.1 06/17/2016 1600   BILITOT 0.4 03/29/2014 2127     Lab Results  Component Value Date   WBC 8.2 06/23/2016   HGB 12.1 (L) 06/23/2016   HCT 37.0 (L) 06/23/2016   MCV 93.7 06/23/2016   PLT 142 (L) 06/23/2016   EKG: Paced rhythm Echo: .Left ventricle: The cavity size was moderately dilated. Systolic   function was severely reduced. The estimated ejection fraction   was 20%. Severe diffuse hypokinesis with distinct regional wall   motion abnormalities. There is akinesis of the entireapical   myocardium. There was a reduced contribution of atrial   contraction to ventricular filling, due to increased ventricular   diastolic pressure or atrial contractile dysfunction. Doppler   parameters are consistent with a reversible restrictive pattern,   indicative of decreased left ventricular diastolic compliance   and/or increased left atrial pressure (grade 3 diastolic   dysfunction). Doppler parameters are consistent with high   ventricular filling pressure. - Aortic valve: Trileaflet; normal thickness, mildly calcified   leaflets. - Left atrium: The atrium was mildly dilated. - Right ventricle: The RV is not well visualized but RVF appears at   least moderately reduced. - Pulmonary arteries: PA peak pressure: 35 mm Hg (S).  Anesthesia Physical Anesthesia Plan  ASA: IV  Anesthesia Plan: MAC   Post-op  Pain Management:    Induction: Intravenous  Airway Management Planned: Natural Airway  Additional Equipment:   Intra-op Plan:   Post-operative Plan:   Informed Consent: I have reviewed the patients History and Physical, chart, labs and discussed the procedure including the risks, benefits and alternatives for the proposed anesthesia with the patient or authorized representative who has indicated his/her understanding and acceptance.     Plan Discussed with: CRNA, Anesthesiologist and Surgeon  Anesthesia Plan Comments:          Anesthesia Quick Evaluation

## 2016-08-18 NOTE — Discharge Instructions (Signed)

## 2016-08-18 NOTE — Op Note (Signed)
[facility] Patient Name: Ronald Miller Procedure Date : 08/18/2016 MRN: [id number] Attending MD: Jerene Bears , MD Date of Birth: 23-Aug-1956 CSN: [id number] Age: 60 Admit Type: Inpatient Procedure:                Colonoscopy Indications:              Screening for colorectal malignant neoplasm, This                            is the patient's first colonoscopy Providers:                Lajuan Lines. Hilarie Fredrickson, MD, Cleda Daub, RN, Corliss Parish, Technician Referring MD:             Shaune Pascal. Bensimhom, MD Medicines:                Monitored Anesthesia Care Complications:            No immediate complications. Estimated Blood Loss:     Estimated blood loss: none. Procedure:                Pre-Anesthesia Assessment:                           - Prior to the procedure, a History and Physical                            was performed, and patient medications and                            allergies were reviewed. The patient's tolerance of                            previous anesthesia was also reviewed. The risks                            and benefits of the procedure and the sedation                            options and risks were discussed with the patient.                            All questions were answered, and informed consent                            was obtained. Prior Anticoagulants: The patient has                            taken no previous anticoagulant or antiplatelet                            agents. ASA Grade Assessment: IV - A patient with  severe systemic disease that is a constant threat                            to life. After reviewing the risks and benefits,                            the patient was deemed in satisfactory condition to                            undergo the procedure.                           After obtaining informed consent, the colonoscope                            was passed  under direct vision. Throughout the                            procedure, the patient's blood pressure, pulse, and                            oxygen saturations were monitored continuously. The                            EC-[id number] scope was introduced through                            the anus and advanced to the the cecum, identified                            by appendiceal orifice and ileocecal valve. The                            colonoscopy was performed without difficulty. The                            patient tolerated the procedure well. The quality                            of the bowel preparation was good. The ileocecal                            valve, appendiceal orifice, and rectum were                            photographed. Scope In: 12:15:26 PM Scope Out: 12:33:11 PM Scope Withdrawal Time: 0 hours 9 minutes 27 seconds  Total Procedure Duration: 0 hours 17 minutes 45 seconds  Findings:      The digital rectal exam was normal.      A few small-mouthed diverticula were found in the sigmoid colon, hepatic       flexure and ascending colon.      Internal hemorrhoids were found during retroflexion. The hemorrhoids       were small.  The exam was otherwise without abnormality. Impression:               - Mild diverticulosis in the sigmoid colon, at the                            hepatic flexure and in the ascending colon.                           - Small internal hemorrhoids.                           - The examination was otherwise normal.                           - No specimens collected. Moderate Sedation:      N/A Recommendation:           - Patient has a contact number available for                            emergencies. The signs and symptoms of potential                            delayed complications were discussed with the                            patient. Return to normal activities tomorrow.                            Written discharge  instructions were provided to the                            patient.                           - Resume previous diet.                           - Continue present medications.                           - Repeat colonoscopy in 10 years for screening                            purposes. Procedure Code(s):        --- Professional ---                           [id number], Colorectal cancer screening; colonoscopy on                            individual not meeting criteria for high risk Diagnosis Code(s):        --- Professional ---                           Z12.11, Encounter for screening for malignant  neoplasm of colon                           K64.8, Other hemorrhoids                           K57.30, Diverticulosis of large intestine without                            perforation or abscess without bleeding CPT copyright 2016 American Medical Association. All rights reserved. The codes documented in this report are preliminary and upon coder review may  be revised to meet current compliance requirements. Jerene Bears, MD 08/18/2016 12:41:21 PM This report has been signed electronically. Number of Addenda: 0

## 2016-08-18 NOTE — H&P (Signed)
HPI: This is a 60 year old male with hx of CHF s/p AICD in workup for heart transplant on continuous milrinone here for screening colon.  Never had colon before.  Some constipation better with senna PRN.  No report of abd pain, GI bleeding.  Denies FH of CRC.   Past Medical History:  Diagnosis Date  . AICD (automatic cardioverter/defibrillator) present   . ASCVD (arteriosclerotic cardiovascular disease)   . Chronic systolic CHF (congestive heart failure) ([place])   . COPD (chronic obstructive pulmonary disease) ([place])   . Coronary artery disease   . Depression   . Diabetes mellitus   . History of cocaine abuse   . Hypertension   . Shortness of breath dyspnea   . Suicidal ideation   . Tobacco abuse     Past Surgical History:  Procedure Laterality Date  . CARDIAC CATHETERIZATION N/A 02/12/2016   Procedure: Right/Left Heart Cath and Coronary/Graft Angiography;  Surgeon: Jolaine Artist, MD;  Location: [facility];  Service: Cardiovascular;  Laterality: N/A;  . CARDIAC CATHETERIZATION N/A 03/22/2016   Procedure: Right Heart Cath;  Surgeon: Jolaine Artist, MD;  Location: [facility];  Service: Cardiovascular;  Laterality: N/A;  . CARDIAC DEFIBRILLATOR PLACEMENT    . CARDIAC DEFIBRILLATOR PLACEMENT    . CHOLECYSTECTOMY N/A 08/30/2015   Procedure: LAPAROSCOPIC CHOLECYSTECTOMY;  Surgeon: Hubbard Robinson, MD;  Location: [name];  Service: General;  Laterality: N/A;  . CORONARY ARTERY BYPASS GRAFT  2001  . INSERT / REPLACE / REMOVE PACEMAKER    . IR GENERIC HISTORICAL  08/03/2016   IR FLUORO GUIDE CV LINE RIGHT 08/03/2016 [name], MD [facility]  . LUMBAR DISC SURGERY  02/1999   Discectomy and fusion  . VASECTOMY     Subsequent reversal     (Not in an outpatient encounter)  Allergies  Allergen Reactions  . Codeine Nausea And Vomiting  . Lipitor [Atorvastatin] Nausea Only    Family History  Problem Relation Age of Onset  . CAD Father   . Diabetes  Father   . Heart failure Father     Social History  Substance Use Topics  . Smoking status: Former Smoker    Packs/day: 1.50    Years: 23.00    Types: Cigarettes    Quit date: 08/27/2015  . Smokeless tobacco: Never Used  . Alcohol use No    ROS: As per history of present illness, otherwise negative  BP 102/74   Pulse 77   Temp 97 F (36.1 C) (Oral)   Resp 20   Ht 5\' 9"  (1.753 m)   Wt 225 lb (102.1 kg)   SpO2 96%   BMI 33.23 kg/m  Gen: awake, alert, NAD HEENT: anicteric, op clear CV: RRR, ICD in place Pulm: CTA b/l Abd: soft, NT/ND, +BS throughout Ext: no c/c, trace pretib edema Neuro: nonfocal   RELEVANT LABS AND IMAGING: CBC    Component Value Date/Time   WBC 8.2 06/23/2016 1245   RBC 3.95 (L) 06/23/2016 1245   HGB 12.1 (L) 06/23/2016 1245   HGB 13.6 04/09/2014 0848   HCT 37.0 (L) 06/23/2016 1245   HCT 41.2 04/09/2014 0848   PLT 142 (L) 06/23/2016 1245   PLT 162 04/09/2014 0848   MCV 93.7 06/23/2016 1245   MCV 92 04/09/2014 0848   MCH 30.6 06/23/2016 1245   MCHC 32.7 06/23/2016 1245   RDW 14.4 06/23/2016 1245   RDW 14.4 04/09/2014 0848   LYMPHSABS 1.2 06/17/2016 1600  LYMPHSABS 2.2 03/30/2014 0509   MONOABS 0.6 06/17/2016 1600   MONOABS 0.6 03/30/2014 0509   EOSABS 0.1 06/17/2016 1600   EOSABS 0.1 03/30/2014 0509   BASOSABS 0.0 06/17/2016 1600   BASOSABS 0.0 03/30/2014 0509    CMP     Component Value Date/Time   NA 138 06/23/2016 1245   NA 136 04/09/2014 0848   K 4.1 06/23/2016 1245   K 3.9 04/09/2014 0848   CL 99 (L) 06/23/2016 1245   CL 100 04/09/2014 0848   CO2 31 06/23/2016 1245   CO2 32 04/09/2014 0848   GLUCOSE 128 (H) 06/23/2016 1245   GLUCOSE 191 (H) 04/09/2014 0848   BUN 33 (H) 06/23/2016 1245   BUN 12 04/09/2014 0848   CREATININE 1.14 06/23/2016 1245   CREATININE 1.07 04/09/2014 0848   CALCIUM 9.8 06/23/2016 1245   CALCIUM 8.9 04/09/2014 0848   PROT 7.0 06/17/2016 1600   PROT 7.3 03/29/2014 2127   ALBUMIN 4.1 06/17/2016  1600   ALBUMIN 3.9 03/29/2014 2127   AST 22 06/17/2016 1600   AST 24 03/29/2014 2127   ALT 18 06/17/2016 1600   ALT 40 03/29/2014 2127   ALKPHOS 76 06/17/2016 1600   ALKPHOS 82 03/29/2014 2127   BILITOT 1.1 06/17/2016 1600   BILITOT 0.4 03/29/2014 2127   GFRNONAA >60 06/23/2016 1245   GFRNONAA >60 04/09/2014 0848   GFRAA >60 06/23/2016 1245   GFRAA >60 04/09/2014 0848    ASSESSMENT/PLAN:  60 year old male with hx of CHF s/p AICD in workup for heart transplant on continuous milrinone here for screening colon.  1. CRC screening -- colonoscopy today with MAC.  Marland KitchenThe nature of the procedure, as well as the risks, benefits, and alternatives were carefully and thoroughly reviewed with the patient. Ample time for discussion and questions allowed. The patient understood, was satisfied, and agreed to proceed.

## 2016-08-18 NOTE — Transfer of Care (Signed)
Immediate Anesthesia Transfer of Care Note  Patient: Ronald Miller.  Procedure(s) Performed: Procedure(s): COLONOSCOPY WITH PROPOFOL (N/A)  Patient Location: Endoscopy Unit  Anesthesia Type:MAC  Level of Consciousness: awake, alert  and oriented  Airway & Oxygen Therapy: Patient Spontanous Breathing and Patient connected to face mask oxygen  Post-op Assessment: Report given to RN, Post -op Vital signs reviewed and stable and Patient moving all extremities X 4  Post vital signs: Reviewed and stable  Last Vitals:  Vitals:   08/18/16 1022  BP: 102/74  Pulse: 77  Resp: 20  Temp: 36.1 C    Last Pain:  Vitals:   08/18/16 1022  TempSrc: Oral         Complications: No apparent anesthesia complications

## 2016-08-18 NOTE — Anesthesia Procedure Notes (Deleted)
Anesthesia Regional Block: Narrative:       

## 2016-08-19 ENCOUNTER — Telehealth (HOSPITAL_COMMUNITY): Payer: Self-pay | Admitting: Surgery

## 2016-08-19 ENCOUNTER — Encounter (HOSPITAL_COMMUNITY): Payer: Self-pay | Admitting: Internal Medicine

## 2016-08-19 ENCOUNTER — Telehealth: Payer: Self-pay | Admitting: *Deleted

## 2016-08-19 ENCOUNTER — Encounter: Payer: Self-pay | Admitting: *Deleted

## 2016-08-19 DIAGNOSIS — I5022 Chronic systolic (congestive) heart failure: Secondary | ICD-10-CM

## 2016-08-19 NOTE — Telephone Encounter (Signed)
I received a call from Mr. Ronald Miller regarding the fact that he had a colonoscopy yesterday and is still feeling quite tired.  He tells me that he checked his "vitals" and everything was "ok" this morning.  I advised that if he does not feel much better tomorrow to contact the nurse line in the [facility].

## 2016-08-19 NOTE — Telephone Encounter (Signed)
Ronald Miller returned call and requested to be discharged at this time.  He would like to return to finish program once he is able.

## 2016-08-19 NOTE — Progress Notes (Signed)
Discharge Summary  Patient Details  Name: Ronald Miller. MRN: [id number] Date of Birth: 1956/01/26 Referring Provider:   Flowsheet Row Cardiac Rehab from 05/24/2016 in [facility]  Referring Provider  Glori Bickers MD       Number of Visits:4  Reason for Discharge:  Early Exit:  Personal and On Going Medical Concerns  Smoking History:  History  Smoking Status  . Former Smoker  . Packs/day: 1.50  . Years: 23.00  . Types: Cigarettes  . Quit date: 08/27/2015  Smokeless Tobacco  . Never Used    Diagnosis:  Heart failure, chronic systolic (HCC)  ADL UCSD:   Initial Exercise Prescription:     Initial Exercise Prescription - 05/24/16 1300      Date of Initial Exercise RX and Referring Provider   Date 05/24/16   Referring Provider Glori Bickers MD     Treadmill   MPH 2   Grade 1   Minutes 15   METs 2.81     NuStep   Level 3  80-100 spm   Minutes 15   METs 2     Recumbant Elliptical   Level 2   RPM 50   Minutes 15   METs 2     Prescription Details   Frequency (times per week) 2   Duration Progress to 45 minutes of aerobic exercise without signs/symptoms of physical distress     Intensity   THRR 40-80% of Max Heartrate 113-145   Ratings of Perceived Exertion 11-13   Perceived Dyspnea 0-4     Progression   Progression Continue to progress workloads to maintain intensity without signs/symptoms of physical distress.     Resistance Training   Training Prescription Yes   Weight 3 lbs   Reps 10-15      Discharge Exercise Prescription (Final Exercise Prescription Changes):     Exercise Prescription Changes - 06/23/16 1400      Exercise Review   Progression --  not a full session     Response to Exercise   Blood Pressure (Admit) 120/60   Blood Pressure (Exit) 120/70   Heart Rate (Admit) 70 bpm   Heart Rate (Exercise) 106 bpm   Heart Rate (Exit) 73 bpm   Rating of Perceived Exertion (Exercise) 13   Symptoms  coughing   Duration Progress to 45 minutes of aerobic exercise without signs/symptoms of physical distress   Intensity THRR unchanged     Progression   Progression Continue to progress workloads to maintain intensity without signs/symptoms of physical distress.   Average METs 2.3     NuStep   Level 3   Minutes 15   METs 2.3      Functional Capacity:     6 Minute Walk    Row Name 05/24/16 1343         6 Minute Walk   Phase Initial     Distance 1200 feet     Walk Time 6 minutes     # of Rest Breaks 0     MPH 2.27     METS 3.09     RPE 13     VO2 Peak 10.82     Symptoms Yes (comment)     Comments muscles in chest (intercostals) and calves sore, ankles were sore     Resting HR 81 bpm     Resting BP 122/64     Max Ex. HR 109 bpm     Max Ex. BP 126/62  2 Minute Post BP 126/64       Interval Oxygen   Interval Oxygen? Yes     Baseline Oxygen Saturation % 94 %     Baseline Liters of Oxygen 0 L  Room Air     1 Minute Oxygen Saturation % 92 %     1 Minute Liters of Oxygen 0 L     2 Minute Oxygen Saturation % 92 %     2 Minute Liters of Oxygen 0 L     3 Minute Oxygen Saturation % 93 %     3 Minute Liters of Oxygen 0 L     4 Minute Oxygen Saturation % 94 %     4 Minute Liters of Oxygen 0 L     5 Minute Oxygen Saturation % 94 %     5 Minute Liters of Oxygen 0 L     6 Minute Oxygen Saturation % 94 %     6 Minute Liters of Oxygen 0 L     2 Minute Post Oxygen Saturation % 97 %     2 Minute Post Liters of Oxygen 0 L        Psychological, QOL, Others - Outcomes: PHQ 2/9: Depression screen [facility] 2/9 05/24/2016 02/03/2016 11/06/2015  Decreased Interest 1 0 1  Down, Depressed, Hopeless 0 0 2  PHQ - 2 Score 1 0 3  Altered sleeping 2 - -  Tired, decreased energy 1 - -  Change in appetite 0 - -  Feeling bad or failure about yourself  1 - -  Trouble concentrating 0 - -  Moving slowly or fidgety/restless 2 - -  Suicidal thoughts 0 - 0  PHQ-9 Score 7 - -  Difficult doing  work/chores Not difficult at all - -    Quality of Life:     Quality of Life - 05/24/16 1856      Quality of Life Scores   Health/Function Pre 19.7 %   Socioeconomic Pre 18.43 %   Psych/Spiritual Pre 26.79 %   Family Pre 20.4 %   GLOBAL Pre 21 %      Personal Goals: Goals established at orientation with interventions provided to work toward goal.     Personal Goals and Risk Factors at Admission - 05/24/16 1521      Core Components/Risk Factors/Patient Goals on Admission    Weight Management Yes;Weight Maintenance;Weight Loss   Intervention Weight Management: Develop a combined nutrition and exercise program designed to reach desired caloric intake, while maintaining appropriate intake of nutrient and fiber, sodium and fats, and appropriate energy expenditure required for the weight goal.;Weight Management: Provide education and appropriate resources to help participant work on and attain dietary goals.   Hypertension Yes   Intervention Provide education on lifestyle modifcations including regular physical activity/exercise, weight management, moderate sodium restriction and increased consumption of fresh fruit, vegetables, and low fat dairy, alcohol moderation, and smoking cessation.;Monitor prescription use compliance.   Expected Outcomes Short Term: Continued assessment and intervention until BP is < 140/53mm HG in hypertensive participants. < 130/27mm HG in hypertensive participants with diabetes, heart failure or chronic kidney disease.;Long Term: Maintenance of blood pressure at goal levels.       Personal Goals Discharge:     Goals and Risk Factor Review    Row Name 06/01/16 [phone]             Core Components/Risk Factors/Patient Goals Review   Review Kainan called and said he is sorry that  he is not here in Cardiac Rehab today. He is afraid they are going to put him in the hospital due to cellulitis in his legs.           Nutrition & Weight -  Outcomes:    Nutrition:     Nutrition Therapy & Goals - 05/24/16 1521      Nutrition Therapy   Drug/Food Interactions Statins/Certain Fruits     Intervention Plan   Intervention Prescribe, educate and counsel regarding individualized specific dietary modifications aiming towards targeted core components such as weight, hypertension, lipid management, diabetes, heart failure and other comorbidities.   Expected Outcomes Short Term Goal: Understand basic principles of dietary content, such as calories, fat, sodium, cholesterol and nutrients.;Long Term Goal: Adherence to prescribed nutrition plan.;Short Term Goal: A plan has been developed with personal nutrition goals set during dietitian appointment.      Nutrition Discharge:   Education Questionnaire Score:     Knowledge Questionnaire Score - 05/24/16 1305      Knowledge Questionnaire Score   Pre Score 24      Goals reviewed with patient; copy given to patient.

## 2016-08-19 NOTE — Progress Notes (Signed)
Cardiac Individual Treatment Plan  Patient Details  Name: Ronald Miller. MRN: [id number] Date of Birth: 14-Apr-1956 Referring Provider:   Flowsheet Row Cardiac Rehab from 05/24/2016 in [facility] Cardiac and Pulmonary Rehab  Referring Provider  Glori Bickers MD      Initial Encounter Date:  Flowsheet Row Cardiac Rehab from 05/24/2016 in [facility] Cardiac and Pulmonary Rehab  Date  05/24/16  Referring Provider  Glori Bickers MD      Visit Diagnosis: Heart failure, chronic systolic ([place])  Patient's Home Medications on Admission:  Current Outpatient Prescriptions:  .  acetaminophen (TYLENOL) 500 MG tablet, Take 1,000 mg by mouth every 6 (six) hours as needed for mild pain or headache., Disp: , Rfl:  .  albuterol (PROVENTIL HFA;VENTOLIN HFA) 108 (90 BASE) MCG/ACT inhaler, Inhale 2 puffs into the lungs every 6 (six) hours as needed for wheezing or shortness of breath., Disp: , Rfl:  .  aspirin EC 81 MG tablet, Take 81 mg by mouth every morning. , Disp: , Rfl:  .  atorvastatin (LIPITOR) 20 MG tablet, Take 20 mg by mouth daily. , Disp: , Rfl:  .  baclofen (LIORESAL) 10 MG tablet, Take 10 mg by mouth 3 (three) times daily as needed for muscle spasms., Disp: , Rfl:  .  digoxin (LANOXIN) 0.25 MG tablet, TAKE 1 TABLET EVERY DAY, Disp: 90 tablet, Rfl: 1 .  escitalopram (LEXAPRO) 20 MG tablet, Take 1 tablet (20 mg total) by mouth daily., Disp: 30 tablet, Rfl: 0 .  insulin aspart (NOVOLOG) 100 UNIT/ML injection, Inject 10 Units into the skin 3 (three) times daily before meals., Disp: , Rfl:  .  insulin glargine (LANTUS) 100 UNIT/ML injection, Inject 40 Units into the skin at bedtime., Disp: , Rfl:  .  isosorbide mononitrate (IMDUR) 30 MG 24 hr tablet, Take 1 tablet (30 mg total) by mouth daily., Disp: 90 tablet, Rfl: 3 .  losartan (COZAAR) 25 MG tablet, Take 0.5 tablets (12.5 mg total) by mouth 2 (two) times daily., Disp: 180 tablet, Rfl: 1 .  metolazone (ZAROXOLYN) 5 MG tablet, Take 5 mg by mouth  as needed (if weight >229 lb)., Disp: , Rfl:  .  metoprolol succinate (TOPROL-XL) 100 MG 24 hr tablet, Take 1 tablet (100 mg total) by mouth daily. Take with or immediately following a meal., Disp: 90 tablet, Rfl: 3 .  milrinone (PRIMACOR) 20 MG/100 ML SOLN infusion, Inject 41.025 mcg/min into the vein continuous., Disp: 1000 mL, Rfl: 0 .  Multiple Vitamins-Minerals (SENTRY ADULT) TABS, Take 1 tablet by mouth daily., Disp: , Rfl:  .  Na Sulfate-K Sulfate-Mg Sulf 17.5-3.13-1.6 GM/180ML SOLN, Take 1 kit by mouth as directed., Disp: 354 mL, Rfl: 0 .  nicotine (NICODERM CQ - DOSED IN MG/24 HOURS) 21 mg/24hr patch, Place 1 patch (21 mg total) onto the skin daily., Disp: 28 patch, Rfl: 0 .  Omega-3 Fatty Acids (FISH OIL) 1000 MG CAPS, Take 1,000 mg by mouth daily., Disp: , Rfl:  .  potassium chloride SA (K-DUR,KLOR-CON) 20 MEQ tablet, Take 2 tablets (40 mEq total) by mouth 3 (three) times daily., Disp: 360 tablet, Rfl: 1 .  senna (SENOKOT) 8.6 MG TABS tablet, Take 2 tablets (17.2 mg total) by mouth at bedtime as needed. Contact PCP for further refills, this is a courtesy refill, Disp: 180 each, Rfl: 0 .  spironolactone (ALDACTONE) 25 MG tablet, Take 1 tablet (25 mg total) by mouth daily., Disp: 90 tablet, Rfl: 1 .  torsemide (DEMADEX) 20 MG tablet, Take  2 tablets (40 mg total) by mouth 2 (two) times daily., Disp: 360 tablet, Rfl: 3 .  zolpidem (AMBIEN) 5 MG tablet, Take 1 tablet (5 mg total) by mouth at bedtime as needed for sleep., Disp: 30 tablet, Rfl: 0  Past Medical History: Past Medical History:  Diagnosis Date  . AICD (automatic cardioverter/defibrillator) present   . ASCVD (arteriosclerotic cardiovascular disease)   . Chronic systolic CHF (congestive heart failure) ([place])   . COPD (chronic obstructive pulmonary disease) ([place])   . Coronary artery disease   . Depression   . Diabetes mellitus   . History of cocaine abuse   . Hypertension   . Shortness of breath dyspnea   . Suicidal ideation    . Tobacco abuse     Tobacco Use: History  Smoking Status  . Former Smoker  . Packs/day: 1.50  . Years: 23.00  . Types: Cigarettes  . Quit date: 08/27/2015  Smokeless Tobacco  . Never Used    Labs: Recent Review Flowsheet Data    Labs for ITP Cardiac and Pulmonary Rehab Latest Ref Rng & Units 03/23/2016 03/24/2016 03/25/2016 06/23/2016 06/29/2016   Cholestrol 0 - 200 mg/dL - - - - -   LDLCALC 0 - 100 mg/dL - - - - -   HDL 40 - 60 mg/dL - - - - -   Trlycerides 0 - 200 mg/dL - - - - -   Hemoglobin A1c 4.8 - 5.6 % - - - - -   PHART 7.350 - 7.450 - - - - -   PCO2ART 35.0 - 45.0 mmHg - - - - -   HCO3 20.0 - 24.0 mEq/L - - - - -   TCO2 0 - 100 mmol/L - - - - -   O2SAT % 58.4 61.6 59.8 55.4 50.0       Exercise Target Goals:    Exercise Program Goal: Individual exercise prescription set with THRR, safety & activity barriers. Participant demonstrates ability to understand and report RPE using BORG scale, to self-measure pulse accurately, and to acknowledge the importance of the exercise prescription.  Exercise Prescription Goal: Starting with aerobic activity 30 plus minutes a day, 3 days per week for initial exercise prescription. Provide home exercise prescription and guidelines that participant acknowledges understanding prior to discharge.  Activity Barriers & Risk Stratification:     Activity Barriers & Cardiac Risk Stratification - 05/24/16 1516      Activity Barriers & Cardiac Risk Stratification   Activity Barriers Back Problems;Shortness of Breath   Cardiac Risk Stratification High      6 Minute Walk:     6 Minute Walk    Row Name 05/24/16 1343         6 Minute Walk   Phase Initial     Distance 1200 feet     Walk Time 6 minutes     # of Rest Breaks 0     MPH 2.27     METS 3.09     RPE 13     VO2 Peak 10.82     Symptoms Yes (comment)     Comments muscles in chest (intercostals) and calves sore, ankles were sore     Resting HR 81 bpm     Resting BP  122/64     Max Ex. HR 109 bpm     Max Ex. BP 126/62     2 Minute Post BP 126/64       Interval Oxygen   Interval Oxygen?  Yes     Baseline Oxygen Saturation % 94 %     Baseline Liters of Oxygen 0 L  Room Air     1 Minute Oxygen Saturation % 92 %     1 Minute Liters of Oxygen 0 L     2 Minute Oxygen Saturation % 92 %     2 Minute Liters of Oxygen 0 L     3 Minute Oxygen Saturation % 93 %     3 Minute Liters of Oxygen 0 L     4 Minute Oxygen Saturation % 94 %     4 Minute Liters of Oxygen 0 L     5 Minute Oxygen Saturation % 94 %     5 Minute Liters of Oxygen 0 L     6 Minute Oxygen Saturation % 94 %     6 Minute Liters of Oxygen 0 L     2 Minute Post Oxygen Saturation % 97 %     2 Minute Post Liters of Oxygen 0 L        Initial Exercise Prescription:     Initial Exercise Prescription - 05/24/16 1300      Date of Initial Exercise RX and Referring Provider   Date 05/24/16   Referring Provider Glori Bickers MD     Treadmill   MPH 2   Grade 1   Minutes 15   METs 2.81     NuStep   Level 3  80-100 spm   Minutes 15   METs 2     Recumbant Elliptical   Level 2   RPM 50   Minutes 15   METs 2     Prescription Details   Frequency (times per week) 2   Duration Progress to 45 minutes of aerobic exercise without signs/symptoms of physical distress     Intensity   THRR 40-80% of Max Heartrate 113-145   Ratings of Perceived Exertion 11-13   Perceived Dyspnea 0-4     Progression   Progression Continue to progress workloads to maintain intensity without signs/symptoms of physical distress.     Resistance Training   Training Prescription Yes   Weight 3 lbs   Reps 10-15      Perform Capillary Blood Glucose checks as needed.  Exercise Prescription Changes:     Exercise Prescription Changes    Row Name 06/09/16 1400 06/23/16 1400           Exercise Review   Progression -  From First full day of exercise 8/24 -  not a full session        Response to  Exercise   Blood Pressure (Admit) 124/60 120/60      Blood Pressure (Exercise) 126/74  -      Blood Pressure (Exit) 114/50 120/70      Heart Rate (Admit) 54 bpm 70 bpm      Heart Rate (Exercise) 109 bpm 106 bpm      Heart Rate (Exit) 72 bpm 73 bpm      Rating of Perceived Exertion (Exercise) 15 13      Symptoms none coughing      Duration Progress to 45 minutes of aerobic exercise without signs/symptoms of physical distress Progress to 45 minutes of aerobic exercise without signs/symptoms of physical distress      Intensity THRR unchanged THRR unchanged        Progression   Progression Continue to progress workloads to maintain intensity without signs/symptoms of physical distress.  Continue to progress workloads to maintain intensity without signs/symptoms of physical distress.      Average METs 1.92 2.3        Resistance Training   Training Prescription Yes  -      Weight 3 lbs  -      Reps 10-15  -        Interval Training   Interval Training No  -        Treadmill   MPH 1.4  -      Grade 0.5  -      Minutes 15  -      METs 2.17  -        NuStep   Level 3 3      Minutes 15 15      METs 2.2 2.3        Recumbant Elliptical   Level 2  -      RPM 50  -      Minutes 15  -      METs 1.4  -         Exercise Comments:     Exercise Comments    Row Name 05/27/16 1057 06/09/16 1448 06/17/16 1026 06/23/16 1433 07/20/16 1515   Exercise Comments First full day of exercise!  Patient was oriented to gym and equipment including functions, settings, policies, and procedures.  Patient's individual exercise prescription and treatment plan were reviewed.  All starting workloads were established based on the results of the 6 minute walk test done at initial orientation visit.  The plan for exercise progression was also introduced and progression will be customized based on patient's performance and goals.  [name] did need to reduce workloads on the treadmill today for his first day. He also  had a dizzy spell when he finished the treadmill as his blood pressure had dropped.  He was given water and recovered; he was able to finish the rest of the class without symptoms.  His blood sugar dropped during exercise as he only had cantaloupe and watermelon for breakfast.  He was given PB crackers and encouraged to eat protein for breakfast. [name] has been out with cellulitis since last vist on 8/24. Gunnar Fusi. did not complete his rehab session.  He was sent home early after coughing up green phlegm.  He is going to try the 4pm class on Monday. [name] has been out since last review with bronchitis.  He was seen in office today and cleared to return to rehab. Out since last review.      Discharge Exercise Prescription (Final Exercise Prescription Changes):     Exercise Prescription Changes - 06/23/16 1400      Exercise Review   Progression --  not a full session     Response to Exercise   Blood Pressure (Admit) 120/60   Blood Pressure (Exit) 120/70   Heart Rate (Admit) 70 bpm   Heart Rate (Exercise) 106 bpm   Heart Rate (Exit) 73 bpm   Rating of Perceived Exertion (Exercise) 13   Symptoms coughing   Duration Progress to 45 minutes of aerobic exercise without signs/symptoms of physical distress   Intensity THRR unchanged     Progression   Progression Continue to progress workloads to maintain intensity without signs/symptoms of physical distress.   Average METs 2.3     NuStep   Level 3   Minutes 15   METs 2.3      Nutrition:  Target Goals:  Understanding of nutrition guidelines, daily intake of sodium <1538m, cholesterol <2018m calories 30% from fat and 7% or less from saturated fats, daily to have 5 or more servings of fruits and vegetables.  Biometrics:    Nutrition Therapy Plan and Nutrition Goals:     Nutrition Therapy & Goals - 05/24/16 1521      Nutrition Therapy   Drug/Food Interactions Statins/Certain Fruits     Intervention Plan   Intervention  Prescribe, educate and counsel regarding individualized specific dietary modifications aiming towards targeted core components such as weight, hypertension, lipid management, diabetes, heart failure and other comorbidities.   Expected Outcomes Short Term Goal: Understand basic principles of dietary content, such as calories, fat, sodium, cholesterol and nutrients.;Long Term Goal: Adherence to prescribed nutrition plan.;Short Term Goal: A plan has been developed with personal nutrition goals set during dietitian appointment.      Nutrition Discharge: Rate Your Plate Scores:   Nutrition Goals Re-Evaluation:   Psychosocial: Target Goals: Acknowledge presence or absence of depression, maximize coping skills, provide positive support system. Participant is able to verbalize types and ability to use techniques and skills needed for reducing stress and depression.  Initial Review & Psychosocial Screening:     Initial Psych Review & Screening - 05/24/16 1522      Initial Review   Current issues with History of Depression     Family Dynamics   Good Support System? Yes   Comments History of depression is listed in [name]'s history. When I mentioned that he said what doctor wrote that -I don't have that.       Quality of Life Scores:     Quality of Life - 05/24/16 1856      Quality of Life Scores   Health/Function Pre 19.7 %   Socioeconomic Pre 18.43 %   Psych/Spiritual Pre 26.79 %   Family Pre 20.4 %   GLOBAL Pre 21 %      PHQ-9: Recent Review Flowsheet Data    [facility] 05/24[facility]2016 02/03/2016 11/06/2015   Decreased Interest 1 0 1   Down, Depressed, Hopeless 0 0 2   PHQ - 2 Score 1 0 3   Altered sleeping 2 - -   Tired, decreased energy 1 - -   Change in appetite 0 - -   Feeling bad or failure about yourself  1 - -   Trouble concentrating 0 - -   Moving slowly or fidgety/restless 2 - -   Suicidal thoughts 0 - 0   PHQ-9 Score 7 - -   Difficult doing work/chores Not  difficult at all - -      Psychosocial Evaluation and Intervention:   Psychosocial Re-Evaluation:     Psychosocial Re-Evaluation    Row Name 06/01/16 [id number]           Psychosocial Re-Evaluation   Comments MeFrancescoalled and said he is sorry that he is not here in Cardiac Rehab today. He is afraid they are going to put him in the hospital due to cellulitis in his legs. Added stress/           Vocational Rehabilitation: Provide vocational rehab assistance to qualifying candidates.   Vocational Rehab Evaluation & Intervention:     Vocational Rehab - 05/24/16 1309      Initial Vocational Rehab Evaluation & Intervention   Assessment shows need for Vocational Rehabilitation No      Education: Education Goals: Education classes will be provided on a weekly  basis, covering required topics. Participant will state understanding/return demonstration of topics presented.  Learning Barriers/Preferences:     Learning Barriers/Preferences - 05/24/16 1516      Learning Barriers/Preferences   Learning Barriers None   Learning Preferences None      Education Topics: General Nutrition Guidelines/Fats and Fiber: -Group instruction provided by verbal, written material, models and posters to present the general guidelines for heart healthy nutrition. Gives an explanation and review of dietary fats and fiber.   Controlling Sodium/Reading Food Labels: -Group verbal and written material supporting the discussion of sodium use in heart healthy nutrition. Review and explanation with models, verbal and written materials for utilization of the food label.   Exercise Physiology & Risk Factors: - Group verbal and written instruction with models to review the exercise physiology of the cardiovascular system and associated critical values. Details cardiovascular disease risk factors and the goals associated with each risk factor.   Aerobic Exercise & Resistance Training: - Gives group  verbal and written discussion on the health impact of inactivity. On the components of aerobic and resistive training programs and the benefits of this training and how to safely progress through these programs.   Flexibility, Balance, General Exercise Guidelines: - Provides group verbal and written instruction on the benefits of flexibility and balance training programs. Provides general exercise guidelines with specific guidelines to those with heart or lung disease. Demonstration and skill practice provided.   Stress Management: - Provides group verbal and written instruction about the health risks of elevated stress, cause of high stress, and healthy ways to reduce stress.   Depression: - Provides group verbal and written instruction on the correlation between heart/lung disease and depressed mood, treatment options, and the stigmas associated with seeking treatment.   Anatomy & Physiology of the Heart: - Group verbal and written instruction and models provide basic cardiac anatomy and physiology, with the coronary electrical and arterial systems. Review of: AMI, Angina, Valve disease, Heart Failure, Cardiac Arrhythmia, Pacemakers, and the ICD.   Cardiac Procedures: - Group verbal and written instruction and models to describe the testing methods done to diagnose heart disease. Reviews the outcomes of the test results. Describes the treatment choices: Medical Management, Angioplasty, or Coronary Bypass Surgery.   Cardiac Medications: - Group verbal and written instruction to review commonly prescribed medications for heart disease. Reviews the medication, class of the drug, and side effects. Includes the steps to properly store meds and maintain the prescription regimen.   Go Sex-Intimacy & Heart Disease, Get SMART - Goal Setting: - Group verbal and written instruction through game format to discuss heart disease and the return to sexual intimacy. Provides group verbal and written  material to discuss and apply goal setting through the application of the S.M.A.R.T. Method.   Other Matters of the Heart: - Provides group verbal, written materials and models to describe Heart Failure, Angina, Valve Disease, and Diabetes in the realm of heart disease. Includes description of the disease process and treatment options available to the cardiac patient.   Exercise & Equipment Safety: - Individual verbal instruction and demonstration of equipment use and safety with use of the equipment. Flowsheet Row Cardiac Rehab from 05/24/2016 in [facility] Cardiac and Pulmonary Rehab  Date  05/24/16  Educator  C. [place]  Instruction Review Code  1- partially meets, needs review/practice      Infection Prevention: - Provides verbal and written material to individual with discussion of infection control including proper hand washing and proper equipment cleaning  during exercise session. Flowsheet Row Cardiac Rehab from 05/24/2016 in [facility] Cardiac and Pulmonary Rehab  Date  05/24/16  Educator  C. [place]  Instruction Review Code  1- partially meets, needs review/practice      Falls Prevention: - Provides verbal and written material to individual with discussion of falls prevention and safety. Flowsheet Row Cardiac Rehab from 05/24/2016 in [facility] Cardiac and Pulmonary Rehab  Date  05/24/16  Educator  C. [place]  Instruction Review Code  2- meets goals/outcomes      Diabetes: - Individual verbal and written instruction to review signs/symptoms of diabetes, desired ranges of glucose level fasting, after meals and with exercise. Advice that pre and post exercise glucose checks will be done for 3 sessions at entry of program. [place] from 05/24/2016 in [facility] Cardiac and Pulmonary Rehab  Date  05/24/16  Educator  C. [place]  Instruction Review Code  1- partially meets, needs review/practice       Knowledge Questionnaire Score:     Knowledge Questionnaire  Score - 05/24/16 1305      Knowledge Questionnaire Score   Pre Score 24      Core Components/Risk Factors/Patient Goals at Admission:     Personal Goals and Risk Factors at Admission - 05/24/16 1521      Core Components/Risk Factors/Patient Goals on Admission    Weight Management Yes;Weight Maintenance;Weight Loss   Intervention Weight Management: Develop a combined nutrition and exercise program designed to reach desired caloric intake, while maintaining appropriate intake of nutrient and fiber, sodium and fats, and appropriate energy expenditure required for the weight goal.;Weight Management: Provide education and appropriate resources to help participant work on and attain dietary goals.   Hypertension Yes   Intervention Provide education on lifestyle modifcations including regular physical activity/exercise, weight management, moderate sodium restriction and increased consumption of fresh fruit, vegetables, and low fat dairy, alcohol moderation, and smoking cessation.;Monitor prescription use compliance.   Expected Outcomes Short Term: Continued assessment and intervention until BP is < 140/70m HG in hypertensive participants. < 130/854mHG in hypertensive participants with diabetes, heart failure or chronic kidney disease.;Long Term: Maintenance of blood pressure at goal levels.      Core Components/Risk Factors/Patient Goals Review:      Goals and Risk Factor Review    Row Name 06/01/16 [phone]           Core Components/Risk Factors/Patient Goals Review   Review MeLennoxalled and said he is sorry that he is not here in Cardiac Rehab today. He is afraid they are going to put him in the hospital due to cellulitis in his legs.           Core Components/Risk Factors/Patient Goals at Discharge (Final Review):      Goals and Risk Factor Review - 06/01/16 0947      Core Components/Risk Factors/Patient Goals Review   Review MeZebulinalled and said he is sorry that he is not  here in Cardiac Rehab today. He is afraid they are going to put him in the hospital due to cellulitis in his legs.       ITP Comments:     ITP Comments    Row Name 05/24/16 1519 05/24/16 1523 06/01/16 0942 06/16/16 0644 07/09/16 1314   ITP Comments "[name]" is interested in getting his mother to exercise with him since she has Silver SnSocial research officer, governmentA Cardiac Rehab REgistered dietician appt was made for [name] since he said he has problems what to  eat with his diabetes and now heart problems with a 2 liter fluid restriction/day. History of depression is listed in [name]'s history. When I mentioned that he said what doctor wrote that -I don't have that.  Camren called and said he is sorry that he is not here in Cardiac Rehab today. He is afraid they are going to put him in the hospital due to cellulitis in his legs.  30 day review. Continue with ITP unless changes noted by Medical Director at signature of review. Called to check on status.  Out with testing and work up for possible heart transplant.  Hopes to return to 4pm class on Monday.   Row Name 07/14/16 1460 07/20/16 1515 08/04/16 1424 08/11/16 0705 08/19/16 1421   ITP Comments 30 day review. Continue with ITP unless changes noted by Medical Director at signature of review. Has been out since 9/14 Called to check on status. LMOM Called to check on status. LM on VM.  Per office note pt is cleared to return as of 10/27.  Today there was a telephone note about low blood pressures and rash around PICC line. 30 day review completed for Medical Director physician review and signature. Continue ITP unless changes made by physician. last visit 06/17/2016 Called to check on status of return.  Last visit 06/17/16   Row Name 08/19/16 1454           ITP Comments [name] returned call and requested to be discharged at this time.  He would like to return to finish program once he is able.          Comments: Discharge ITP

## 2016-08-19 NOTE — Telephone Encounter (Signed)
Called to check on status of return.  Last visit 06/17/16

## 2016-08-20 ENCOUNTER — Other Ambulatory Visit (HOSPITAL_COMMUNITY): Payer: Self-pay | Admitting: Internal Medicine

## 2016-08-20 ENCOUNTER — Other Ambulatory Visit (HOSPITAL_COMMUNITY): Payer: Self-pay

## 2016-08-20 ENCOUNTER — Ambulatory Visit (HOSPITAL_BASED_OUTPATIENT_CLINIC_OR_DEPARTMENT_OTHER)
Admission: RE | Admit: 2016-08-20 | Discharge: 2016-08-20 | Disposition: A | Discharge status: Home / Self Care | Payer: Medicare HMO | Source: Ambulatory Visit | Attending: Cardiology | Admitting: Cardiology

## 2016-08-20 DIAGNOSIS — I5040 Unspecified combined systolic (congestive) and diastolic (congestive) heart failure: Secondary | ICD-10-CM

## 2016-08-20 LAB — BASIC METABOLIC PANEL
Anion gap: 11 (ref 5–15)
BUN: 22 mg/dL — AB (ref 6–20)
CO2: 29 mmol/L (ref 22–32)
CREATININE: 1.24 mg/dL (ref 0.61–1.24)
Calcium: 9.4 mg/dL (ref 8.9–10.3)
Chloride: 95 mmol/L — ABNORMAL LOW (ref 101–111)
GFR calc Af Amer: >60 mL/min (ref >60)
GLUCOSE: 212 mg/dL — AB (ref 65–99)
POTASSIUM: 3.7 mmol/L (ref 3.5–5.1)
SODIUM: 135 mmol/L (ref 135–145)

## 2016-08-20 LAB — RAPID URINE DRUG SCREEN, HOSP PERFORMED
AMPHETAMINES: NOT DETECTED
BARBITURATES: NOT DETECTED
BENZODIAZEPINES: NOT DETECTED
COCAINE: NOT DETECTED
Opiates: NOT DETECTED
Tetrahydrocannabinol: NOT DETECTED

## 2016-08-22 ENCOUNTER — Emergency Department (HOSPITAL_COMMUNITY): Payer: Medicare HMO

## 2016-08-22 ENCOUNTER — Telehealth: Payer: Self-pay | Admitting: Physician Assistant

## 2016-08-22 ENCOUNTER — Inpatient Hospital Stay (HOSPITAL_COMMUNITY)
Admission: EM | Admit: 2016-08-22 | Discharge: 2016-09-25 | DRG: 001 | Disposition: A | Discharge status: Home Health | Payer: Medicare HMO | Attending: Internal Medicine | Admitting: Internal Medicine

## 2016-08-22 ENCOUNTER — Encounter (HOSPITAL_COMMUNITY): Payer: Self-pay

## 2016-08-22 DIAGNOSIS — Z515 Encounter for palliative care: Secondary | ICD-10-CM | POA: Diagnosis not present

## 2016-08-22 DIAGNOSIS — I2510 Atherosclerotic heart disease of native coronary artery without angina pectoris: Secondary | ICD-10-CM | POA: Diagnosis not present

## 2016-08-22 DIAGNOSIS — R112 Nausea with vomiting, unspecified: Secondary | ICD-10-CM

## 2016-08-22 DIAGNOSIS — Z95810 Presence of automatic (implantable) cardiac defibrillator: Secondary | ICD-10-CM

## 2016-08-22 DIAGNOSIS — I509 Heart failure, unspecified: Secondary | ICD-10-CM | POA: Diagnosis not present

## 2016-08-22 DIAGNOSIS — J918 Pleural effusion in other conditions classified elsewhere: Secondary | ICD-10-CM | POA: Diagnosis not present

## 2016-08-22 DIAGNOSIS — Z95811 Presence of heart assist device: Secondary | ICD-10-CM

## 2016-08-22 DIAGNOSIS — I472 Ventricular tachycardia: Secondary | ICD-10-CM | POA: Diagnosis not present

## 2016-08-22 DIAGNOSIS — E1169 Type 2 diabetes mellitus with other specified complication: Secondary | ICD-10-CM | POA: Diagnosis not present

## 2016-08-22 DIAGNOSIS — K7469 Other cirrhosis of liver: Secondary | ICD-10-CM | POA: Diagnosis not present

## 2016-08-22 DIAGNOSIS — Z833 Family history of diabetes mellitus: Secondary | ICD-10-CM

## 2016-08-22 DIAGNOSIS — Z951 Presence of aortocoronary bypass graft: Secondary | ICD-10-CM | POA: Diagnosis not present

## 2016-08-22 DIAGNOSIS — I310 Chronic adhesive pericarditis: Secondary | ICD-10-CM | POA: Diagnosis present

## 2016-08-22 DIAGNOSIS — N179 Acute kidney failure, unspecified: Secondary | ICD-10-CM | POA: Diagnosis present

## 2016-08-22 DIAGNOSIS — D62 Acute posthemorrhagic anemia: Secondary | ICD-10-CM | POA: Diagnosis not present

## 2016-08-22 DIAGNOSIS — E785 Hyperlipidemia, unspecified: Secondary | ICD-10-CM

## 2016-08-22 DIAGNOSIS — I25810 Atherosclerosis of coronary artery bypass graft(s) without angina pectoris: Secondary | ICD-10-CM | POA: Diagnosis present

## 2016-08-22 DIAGNOSIS — F17210 Nicotine dependence, cigarettes, uncomplicated: Secondary | ICD-10-CM | POA: Diagnosis present

## 2016-08-22 DIAGNOSIS — R188 Other ascites: Secondary | ICD-10-CM | POA: Diagnosis not present

## 2016-08-22 DIAGNOSIS — I5023 Acute on chronic systolic (congestive) heart failure: Secondary | ICD-10-CM | POA: Diagnosis present

## 2016-08-22 DIAGNOSIS — K5900 Constipation, unspecified: Secondary | ICD-10-CM | POA: Diagnosis not present

## 2016-08-22 DIAGNOSIS — L03115 Cellulitis of right lower limb: Secondary | ICD-10-CM | POA: Diagnosis present

## 2016-08-22 DIAGNOSIS — R0600 Dyspnea, unspecified: Secondary | ICD-10-CM

## 2016-08-22 DIAGNOSIS — D689 Coagulation defect, unspecified: Secondary | ICD-10-CM | POA: Diagnosis not present

## 2016-08-22 DIAGNOSIS — L03119 Cellulitis of unspecified part of limb: Secondary | ICD-10-CM

## 2016-08-22 DIAGNOSIS — Z885 Allergy status to narcotic agent status: Secondary | ICD-10-CM

## 2016-08-22 DIAGNOSIS — R570 Cardiogenic shock: Secondary | ICD-10-CM | POA: Diagnosis present

## 2016-08-22 DIAGNOSIS — J90 Pleural effusion, not elsewhere classified: Secondary | ICD-10-CM | POA: Diagnosis not present

## 2016-08-22 DIAGNOSIS — Z7982 Long term (current) use of aspirin: Secondary | ICD-10-CM

## 2016-08-22 DIAGNOSIS — Z888 Allergy status to other drugs, medicaments and biological substances status: Secondary | ICD-10-CM

## 2016-08-22 DIAGNOSIS — I5082 Biventricular heart failure: Secondary | ICD-10-CM | POA: Diagnosis present

## 2016-08-22 DIAGNOSIS — E1122 Type 2 diabetes mellitus with diabetic chronic kidney disease: Secondary | ICD-10-CM | POA: Diagnosis present

## 2016-08-22 DIAGNOSIS — R1110 Vomiting, unspecified: Secondary | ICD-10-CM | POA: Diagnosis present

## 2016-08-22 DIAGNOSIS — I5021 Acute systolic (congestive) heart failure: Secondary | ICD-10-CM | POA: Diagnosis not present

## 2016-08-22 DIAGNOSIS — I480 Paroxysmal atrial fibrillation: Secondary | ICD-10-CM | POA: Diagnosis not present

## 2016-08-22 DIAGNOSIS — E876 Hypokalemia: Secondary | ICD-10-CM | POA: Diagnosis not present

## 2016-08-22 DIAGNOSIS — I130 Hypertensive heart and chronic kidney disease with heart failure and stage 1 through stage 4 chronic kidney disease, or unspecified chronic kidney disease: Secondary | ICD-10-CM | POA: Diagnosis present

## 2016-08-22 DIAGNOSIS — N183 Chronic kidney disease, stage 3 (moderate): Secondary | ICD-10-CM | POA: Diagnosis present

## 2016-08-22 DIAGNOSIS — Z8249 Family history of ischemic heart disease and other diseases of the circulatory system: Secondary | ICD-10-CM | POA: Diagnosis not present

## 2016-08-22 DIAGNOSIS — J449 Chronic obstructive pulmonary disease, unspecified: Secondary | ICD-10-CM | POA: Diagnosis present

## 2016-08-22 DIAGNOSIS — L02419 Cutaneous abscess of limb, unspecified: Secondary | ICD-10-CM

## 2016-08-22 DIAGNOSIS — R0602 Shortness of breath: Secondary | ICD-10-CM

## 2016-08-22 DIAGNOSIS — R262 Difficulty in walking, not elsewhere classified: Secondary | ICD-10-CM

## 2016-08-22 DIAGNOSIS — I5022 Chronic systolic (congestive) heart failure: Secondary | ICD-10-CM | POA: Diagnosis not present

## 2016-08-22 DIAGNOSIS — Z794 Long term (current) use of insulin: Secondary | ICD-10-CM

## 2016-08-22 DIAGNOSIS — I255 Ischemic cardiomyopathy: Secondary | ICD-10-CM | POA: Diagnosis present

## 2016-08-22 DIAGNOSIS — D6959 Other secondary thrombocytopenia: Secondary | ICD-10-CM | POA: Diagnosis not present

## 2016-08-22 DIAGNOSIS — Z9889 Other specified postprocedural states: Secondary | ICD-10-CM

## 2016-08-22 DIAGNOSIS — I4510 Unspecified right bundle-branch block: Secondary | ICD-10-CM | POA: Diagnosis present

## 2016-08-22 DIAGNOSIS — K7460 Unspecified cirrhosis of liver: Secondary | ICD-10-CM

## 2016-08-22 HISTORY — DX: Presence of cardiac pacemaker: Z95.0

## 2016-08-22 LAB — CBC
HEMATOCRIT: 36.8 % — AB (ref 39.0–52.0)
HEMOGLOBIN: 12.4 g/dL — AB (ref 13.0–17.0)
MCH: 31.4 pg (ref 26.0–34.0)
MCHC: 33.7 g/dL (ref 30.0–36.0)
MCV: 93.2 fL (ref 78.0–100.0)
Platelets: 107 10*3/uL — ABNORMAL LOW (ref 150–400)
RBC: 3.95 MIL/uL — AB (ref 4.22–5.81)
RDW: 13.5 % (ref 11.5–15.5)
WBC: 7.8 10*3/uL (ref 4.0–10.5)

## 2016-08-22 LAB — I-STAT TROPONIN, ED: TROPONIN I, POC: 0.01 ng/mL (ref 0.00–0.08)

## 2016-08-22 LAB — BRAIN NATRIURETIC PEPTIDE: B Natriuretic Peptide: 1208.4 pg/mL — ABNORMAL HIGH (ref 0.0–100.0)

## 2016-08-22 LAB — BASIC METABOLIC PANEL
ANION GAP: 11 (ref 5–15)
BUN: 21 mg/dL — ABNORMAL HIGH (ref 6–20)
CALCIUM: 8.9 mg/dL (ref 8.9–10.3)
CHLORIDE: 95 mmol/L — AB (ref 101–111)
CO2: 28 mmol/L (ref 22–32)
Creatinine, Ser: 1.23 mg/dL (ref 0.61–1.24)
GFR calc non Af Amer: >60 mL/min (ref >60)
Glucose, Bld: 227 mg/dL — ABNORMAL HIGH (ref 65–99)
POTASSIUM: 3.3 mmol/L — AB (ref 3.5–5.1)
Sodium: 134 mmol/L — ABNORMAL LOW (ref 135–145)

## 2016-08-22 LAB — MRSA PCR SCREENING: MRSA BY PCR: NEGATIVE

## 2016-08-22 LAB — DIGOXIN LEVEL: DIGOXIN LVL: 0.7 ng/mL — AB (ref 0.8–2.0)

## 2016-08-22 LAB — GLUCOSE, CAPILLARY: GLUCOSE-CAPILLARY: 194 mg/dL — AB (ref 65–99)

## 2016-08-22 MED ORDER — ATORVASTATIN CALCIUM 20 MG PO TABS
20.0000 mg | ORAL_TABLET | Freq: Every day | ORAL | Status: DC
  Administered 2016-08-22 – 2016-09-24 (×33): 20 mg via ORAL
  Filled 2016-08-22 (×33): qty 1

## 2016-08-22 MED ORDER — SODIUM CHLORIDE 0.9% FLUSH
3.0000 mL | Freq: Two times a day (BID) | INTRAVENOUS | Status: DC
  Administered 2016-08-22 – 2016-08-24 (×5): 3 mL via INTRAVENOUS
  Administered 2016-08-25: 10 mL via INTRAVENOUS
  Administered 2016-08-26 (×2): 3 mL via INTRAVENOUS

## 2016-08-22 MED ORDER — MILRINONE LACTATE IN DEXTROSE 20-5 MG/100ML-% IV SOLN
0.5000 ug/kg/min | INTRAVENOUS | Status: DC
  Administered 2016-08-22 – 2016-08-23 (×4): 0.375 ug/kg/min via INTRAVENOUS
  Administered 2016-08-24: 0.5 ug/kg/min via INTRAVENOUS
  Administered 2016-08-24: 0.375 ug/kg/min via INTRAVENOUS
  Administered 2016-08-24 – 2016-09-09 (×57): 0.5 ug/kg/min via INTRAVENOUS
  Filled 2016-08-22 (×69): qty 100

## 2016-08-22 MED ORDER — BACLOFEN 10 MG PO TABS
10.0000 mg | ORAL_TABLET | Freq: Three times a day (TID) | ORAL | Status: DC | PRN
  Filled 2016-08-22: qty 1

## 2016-08-22 MED ORDER — ALBUTEROL SULFATE (2.5 MG/3ML) 0.083% IN NEBU: 3.0000 mL | INHALATION_SOLUTION | Freq: Four times a day (QID) | RESPIRATORY_TRACT | Status: DC | PRN

## 2016-08-22 MED ORDER — SODIUM CHLORIDE 0.9% FLUSH
3.0000 mL | INTRAVENOUS | Status: DC | PRN
  Administered 2016-08-22: 3 mL via INTRAVENOUS
  Filled 2016-08-22: qty 3

## 2016-08-22 MED ORDER — INSULIN GLARGINE 100 UNIT/ML ~~LOC~~ SOLN
40.0000 [IU] | Freq: Every day | SUBCUTANEOUS | Status: DC
  Administered 2016-08-22 – 2016-08-29 (×8): 40 [IU] via SUBCUTANEOUS
  Filled 2016-08-22 (×9): qty 0.4

## 2016-08-22 MED ORDER — NICOTINE 21 MG/24HR TD PT24: 21.0000 mg | MEDICATED_PATCH | Freq: Every day | TRANSDERMAL | Status: DC

## 2016-08-22 MED ORDER — ESCITALOPRAM OXALATE 20 MG PO TABS
20.0000 mg | ORAL_TABLET | Freq: Every day | ORAL | Status: DC
  Administered 2016-08-23 – 2016-09-25 (×32): 20 mg via ORAL
  Filled 2016-08-22: qty 2
  Filled 2016-08-22: qty 1
  Filled 2016-08-22: qty 2
  Filled 2016-08-22: qty 1
  Filled 2016-08-22: qty 2
  Filled 2016-08-22: qty 1
  Filled 2016-08-22: qty 2
  Filled 2016-08-22 (×3): qty 1
  Filled 2016-08-22 (×5): qty 2
  Filled 2016-08-22: qty 1
  Filled 2016-08-22: qty 2
  Filled 2016-08-22: qty 1
  Filled 2016-08-22 (×12): qty 2
  Filled 2016-08-22 (×2): qty 1

## 2016-08-22 MED ORDER — ONDANSETRON HCL 4 MG/2ML IJ SOLN
4.0000 mg | Freq: Once | INTRAMUSCULAR | Status: AC
  Administered 2016-08-22: 4 mg via INTRAVENOUS
  Filled 2016-08-22: qty 2

## 2016-08-22 MED ORDER — INSULIN ASPART 100 UNIT/ML ~~LOC~~ SOLN
0.0000 [IU] | Freq: Three times a day (TID) | SUBCUTANEOUS | Status: DC
  Administered 2016-08-23: 5 [IU] via SUBCUTANEOUS
  Administered 2016-08-23: 11 [IU] via SUBCUTANEOUS
  Administered 2016-08-23: 3 [IU] via SUBCUTANEOUS
  Administered 2016-08-24: 2 [IU] via SUBCUTANEOUS
  Administered 2016-08-24: 5 [IU] via SUBCUTANEOUS
  Administered 2016-08-24 – 2016-08-26 (×3): 3 [IU] via SUBCUTANEOUS
  Administered 2016-08-26: 5 [IU] via SUBCUTANEOUS
  Administered 2016-08-27: 3 [IU] via SUBCUTANEOUS
  Administered 2016-08-27: 2 [IU] via SUBCUTANEOUS
  Administered 2016-08-27: 3 [IU] via SUBCUTANEOUS
  Administered 2016-08-28: 5 [IU] via SUBCUTANEOUS
  Administered 2016-08-28 (×2): 3 [IU] via SUBCUTANEOUS
  Administered 2016-08-29: 8 [IU] via SUBCUTANEOUS
  Administered 2016-08-29: 5 [IU] via SUBCUTANEOUS
  Administered 2016-08-29: 11 [IU] via SUBCUTANEOUS
  Administered 2016-08-30: 5 [IU] via SUBCUTANEOUS
  Administered 2016-08-30 – 2016-08-31 (×3): 3 [IU] via SUBCUTANEOUS
  Administered 2016-08-31: 5 [IU] via SUBCUTANEOUS
  Administered 2016-08-31: 8 [IU] via SUBCUTANEOUS
  Administered 2016-09-01: 5 [IU] via SUBCUTANEOUS
  Administered 2016-09-01: 11 [IU] via SUBCUTANEOUS
  Administered 2016-09-01: 5 [IU] via SUBCUTANEOUS
  Administered 2016-09-02: 8 [IU] via SUBCUTANEOUS
  Administered 2016-09-02: 5 [IU] via SUBCUTANEOUS
  Administered 2016-09-03 (×2): 3 [IU] via SUBCUTANEOUS
  Administered 2016-09-04: 8 [IU] via SUBCUTANEOUS
  Administered 2016-09-04: 3 [IU] via SUBCUTANEOUS
  Administered 2016-09-04: 11 [IU] via SUBCUTANEOUS
  Administered 2016-09-05: 8 [IU] via SUBCUTANEOUS
  Administered 2016-09-05: 3 [IU] via SUBCUTANEOUS
  Administered 2016-09-05: 2 [IU] via SUBCUTANEOUS
  Administered 2016-09-06 (×2): 3 [IU] via SUBCUTANEOUS
  Administered 2016-09-06 – 2016-09-07 (×2): 5 [IU] via SUBCUTANEOUS
  Administered 2016-09-07 (×2): 2 [IU] via SUBCUTANEOUS
  Administered 2016-09-08: 3 [IU] via SUBCUTANEOUS
  Administered 2016-09-08: 5 [IU] via SUBCUTANEOUS
  Administered 2016-09-08: 3 [IU] via SUBCUTANEOUS

## 2016-08-22 MED ORDER — MILRINONE LACTATE IN DEXTROSE 20-5 MG/100ML-% IV SOLN: 0.2500 ug/kg/min | INTRAVENOUS | Status: DC

## 2016-08-22 MED ORDER — VANCOMYCIN HCL IN DEXTROSE 750-5 MG/150ML-% IV SOLN
750.0000 mg | Freq: Two times a day (BID) | INTRAVENOUS | Status: DC
  Administered 2016-08-23 – 2016-08-29 (×13): 750 mg via INTRAVENOUS
  Filled 2016-08-22 (×16): qty 150

## 2016-08-22 MED ORDER — POTASSIUM CHLORIDE CRYS ER 20 MEQ PO TBCR
40.0000 meq | EXTENDED_RELEASE_TABLET | Freq: Three times a day (TID) | ORAL | Status: DC
  Administered 2016-08-22: 40 meq via ORAL
  Filled 2016-08-22 (×2): qty 2

## 2016-08-22 MED ORDER — ACETAMINOPHEN 500 MG PO TABS
1000.0000 mg | ORAL_TABLET | Freq: Four times a day (QID) | ORAL | Status: DC | PRN
  Administered 2016-08-24: 1000 mg via ORAL
  Filled 2016-08-22: qty 2

## 2016-08-22 MED ORDER — SENNA 8.6 MG PO TABS
2.0000 | ORAL_TABLET | Freq: Every evening | ORAL | Status: DC | PRN
  Administered 2016-09-01 – 2016-09-24 (×4): 17.2 mg via ORAL
  Filled 2016-08-22 (×4): qty 2

## 2016-08-22 MED ORDER — ISOSORBIDE MONONITRATE ER 30 MG PO TB24
30.0000 mg | ORAL_TABLET | Freq: Every day | ORAL | Status: DC
  Administered 2016-08-23 – 2016-08-26 (×3): 30 mg via ORAL
  Filled 2016-08-22 (×3): qty 1

## 2016-08-22 MED ORDER — ONDANSETRON HCL 4 MG/2ML IJ SOLN
4.0000 mg | Freq: Four times a day (QID) | INTRAMUSCULAR | Status: DC | PRN
  Administered 2016-08-24: 4 mg via INTRAVENOUS
  Filled 2016-08-22: qty 2

## 2016-08-22 MED ORDER — SODIUM CHLORIDE 0.9 % IV SOLN
250.0000 mL | INTRAVENOUS | Status: DC | PRN
Start: 2016-08-22 — End: 2016-08-26

## 2016-08-22 MED ORDER — NICOTINE 21 MG/24HR TD PT24
21.0000 mg | MEDICATED_PATCH | TRANSDERMAL | Status: DC
  Administered 2016-08-22 – 2016-09-06 (×15): 21 mg via TRANSDERMAL
  Filled 2016-08-22 (×17): qty 1

## 2016-08-22 MED ORDER — METOPROLOL SUCCINATE ER 100 MG PO TB24
100.0000 mg | ORAL_TABLET | Freq: Every day | ORAL | Status: DC
  Administered 2016-08-23: 100 mg via ORAL
  Filled 2016-08-22: qty 1

## 2016-08-22 MED ORDER — ZOLPIDEM TARTRATE 5 MG PO TABS: 5.0000 mg | ORAL_TABLET | Freq: Every evening | ORAL | Status: DC | PRN

## 2016-08-22 MED ORDER — SODIUM CHLORIDE 0.9 % IV SOLN
2000.0000 mg | Freq: Once | INTRAVENOUS | Status: AC
  Administered 2016-08-22: 2000 mg via INTRAVENOUS
  Filled 2016-08-22: qty 2000

## 2016-08-22 MED ORDER — INSULIN ASPART 100 UNIT/ML ~~LOC~~ SOLN
0.0000 [IU] | Freq: Every day | SUBCUTANEOUS | Status: DC
  Administered 2016-08-23: 2 [IU] via SUBCUTANEOUS
  Administered 2016-08-23 – 2016-08-24 (×2): 3 [IU] via SUBCUTANEOUS
  Administered 2016-08-25 – 2016-08-26 (×2): 2 [IU] via SUBCUTANEOUS
  Administered 2016-08-27 – 2016-08-28 (×2): 3 [IU] via SUBCUTANEOUS
  Administered 2016-08-29: 2 [IU] via SUBCUTANEOUS
  Administered 2016-08-31: 3 [IU] via SUBCUTANEOUS
  Administered 2016-09-02: 2 [IU] via SUBCUTANEOUS
  Administered 2016-09-05: 4 [IU] via SUBCUTANEOUS
  Administered 2016-09-06: 3 [IU] via SUBCUTANEOUS

## 2016-08-22 MED ORDER — LOSARTAN POTASSIUM 25 MG PO TABS
12.5000 mg | ORAL_TABLET | Freq: Two times a day (BID) | ORAL | Status: DC
  Administered 2016-08-22 – 2016-09-08 (×34): 12.5 mg via ORAL
  Filled 2016-08-22 (×33): qty 1

## 2016-08-22 MED ORDER — METOLAZONE 5 MG PO TABS: 5.0000 mg | ORAL_TABLET | Freq: Every day | ORAL | Status: DC

## 2016-08-22 MED ORDER — ADULT MULTIVITAMIN W/MINERALS CH
1.0000 | ORAL_TABLET | Freq: Every day | ORAL | Status: DC
  Administered 2016-08-23 – 2016-09-25 (×31): 1 via ORAL
  Filled 2016-08-22 (×31): qty 1

## 2016-08-22 MED ORDER — SODIUM CHLORIDE 0.9% FLUSH
10.0000 mL | INTRAVENOUS | Status: DC | PRN
  Administered 2016-08-27 – 2016-09-24 (×11): 10 mL
  Filled 2016-08-22 (×11): qty 40

## 2016-08-22 MED ORDER — SPIRONOLACTONE 25 MG PO TABS
25.0000 mg | ORAL_TABLET | Freq: Every day | ORAL | Status: DC
  Administered 2016-08-23 – 2016-09-08 (×16): 25 mg via ORAL
  Filled 2016-08-22 (×16): qty 1

## 2016-08-22 MED ORDER — ASPIRIN EC 81 MG PO TBEC
81.0000 mg | DELAYED_RELEASE_TABLET | Freq: Every day | ORAL | Status: DC
  Administered 2016-08-23 – 2016-08-24 (×2): 81 mg via ORAL
  Filled 2016-08-22 (×2): qty 1

## 2016-08-22 MED ORDER — DIGOXIN 125 MCG PO TABS
250.0000 ug | ORAL_TABLET | Freq: Every day | ORAL | Status: DC
  Administered 2016-08-23 – 2016-09-19 (×26): 250 ug via ORAL
  Filled 2016-08-22: qty 2
  Filled 2016-08-22 (×3): qty 1
  Filled 2016-08-22: qty 2
  Filled 2016-08-22 (×3): qty 1
  Filled 2016-08-22: qty 2
  Filled 2016-08-22: qty 1
  Filled 2016-08-22 (×6): qty 2
  Filled 2016-08-22 (×10): qty 1
  Filled 2016-08-22: qty 2

## 2016-08-22 MED ORDER — FUROSEMIDE 10 MG/ML IJ SOLN
80.0000 mg | Freq: Two times a day (BID) | INTRAMUSCULAR | Status: DC
  Administered 2016-08-22: 40 mg via INTRAVENOUS
  Administered 2016-08-23 – 2016-08-24 (×3): 80 mg via INTRAVENOUS
  Filled 2016-08-22 (×4): qty 8

## 2016-08-22 MED ORDER — HEPARIN SODIUM (PORCINE) 5000 UNIT/ML IJ SOLN
5000.0000 [IU] | Freq: Three times a day (TID) | INTRAMUSCULAR | Status: DC
  Administered 2016-08-22 – 2016-08-25 (×8): 5000 [IU] via SUBCUTANEOUS
  Filled 2016-08-22 (×8): qty 1

## 2016-08-22 NOTE — ED Triage Notes (Signed)
Patient here with weakness for the past 3-4 days and chest pain. Complains of fatigue and shortness of breath for same. Patient arrived with milrinone infusion through right forearm picc. States that blood sugar has been running greater than 300 today. Alert and oriented, NAD

## 2016-08-22 NOTE — Progress Notes (Signed)
Pharmacy Antibiotic Note  Ronald Miller. is a 60 y.o. male admitted on 08/22/2016 with HF + cellulitis. Starting vancomycin, SCr 1.2, eCrCl ~ 70 ml/hr.   Plan: Vancomycin 2 g IV x1 Vancomycin 750 IV every 12 hours.  Goal trough 10-15 mcg/mL.  Monitor renal fx, cultures, obtain VT as indicated  Height: 5\' 9"  (175.3 cm) Weight: 232 lb 8 oz (105.5 kg) IBW/kg (Calculated) : 70.7  Temp (24hrs), Avg:97.5 F (36.4 C), Min:97.5 F (36.4 C), Max:97.5 F (36.4 C)   Recent Labs Lab 08/20/16 1021 08/22/16 1514  WBC  --  7.8  CREATININE 1.24 1.23    Estimated Creatinine Clearance: 77.4 mL/min (by C-G formula based on SCr of 1.23 mg/dL).    Allergies  Allergen Reactions  . Codeine Nausea And Vomiting  . Lipitor [Atorvastatin] Nausea Only    Antimicrobials this admission: 11/19 vancomycin >  Dose adjustments this admission: NA  Microbiology results: 11/19 mrsa pcr: neg  Thank you for allowing pharmacy to be a part of this patient's care.  Harvel Quale 08/22/2016 8:28 PM

## 2016-08-22 NOTE — ED Provider Notes (Signed)
[facility] DEPT Provider Note   CSN: [id number] Arrival date & time: 08/22/16  1437     History   Chief Complaint Chief Complaint  Patient presents with  . Chest Pain  . Shortness of Breath  . Weakness    HPI Ronald Miller. is a 60 y.o. male.  The history is provided by the patient and medical records. No language interpreter was used.    Patient is a 60 year old male with history of chronic systolic heart failure status post [employer] ICD , CAD status post CABG, hypertension, Remote cocaine abuse, COPD who presents with concern for fluid overload. He reports he has been taking all his diuretics but has not been responding to them for the past few days. Reports decreased UOP over last 24 hours. He endorses a 12 pound weight gain. Some associated nausea and vomiting as well as increasing abdominal distention and lower extremity edema. Patient is on chronic milrinone reports no problems with his PICC line or infusions.  Past Medical History:  Diagnosis Date  . AICD (automatic cardioverter/defibrillator) present   . ASCVD (arteriosclerotic cardiovascular disease)   . Chronic systolic CHF (congestive heart failure) ([place])   . COPD (chronic obstructive pulmonary disease) ([place])   . Coronary artery disease   . Depression   . Diabetes mellitus   . History of cocaine abuse   . Hypertension   . Presence of permanent cardiac pacemaker   . Shortness of breath dyspnea   . Suicidal ideation   . Tobacco abuse     Patient Active Problem List   Diagnosis Date Noted  . Cellulitis and abscess of leg 08/22/2016  . Heart failure ([place]) 08/22/2016  . Special screening for malignant neoplasms, colon 07/16/2016  . DNR (do not resuscitate) discussion   . Palliative care encounter   . Cardiogenic shock ([place])   . Acute systolic congestive heart failure, NYHA class 4 ([place])   . Pain in the chest   . Dyslipidemia associated with type 2 diabetes mellitus ([place])   . Hyponatremia   . Emesis    . Atypical chest pain 03/19/2016  . Ascites 03/10/2016  . ICD (implantable cardioverter-defibrillator) in place 03/09/2016  . CHF (congestive heart failure) ([place]) 02/23/2016  . Chronic systolic heart failure ([place]) 02/04/2016  . Insomnia 02/04/2016  . Pressure ulcer 10/15/2015  . Fluid overload 10/14/2015  . Chronic cholecystitis with calculus   . COPD (chronic obstructive pulmonary disease) ([place]) 08/28/2015  . HTN (hypertension) 08/28/2015  . Depression 08/28/2015  . Elevated troponin 08/28/2015  . AICD discharge 08/28/2015  . Hypokalemia 08/28/2015  . Hypotension 08/28/2015  . Arteriosclerosis of coronary artery 03/05/2014  . IDDM (insulin dependent diabetes mellitus) ([place]) 03/05/2014  . HLD (hyperlipidemia) 03/05/2014  . Type 2 diabetes mellitus ([place]) 08/06/2009  . Cardiovascular disease 08/06/2009  . LOW BACK PAIN, CHRONIC 08/06/2009    Past Surgical History:  Procedure Laterality Date  . CARDIAC CATHETERIZATION N/A 02/12/2016   Procedure: Right/Left Heart Cath and Coronary/Graft Angiography;  Surgeon: Jolaine Artist, MD;  Location: [facility];  Service: Cardiovascular;  Laterality: N/A;  . CARDIAC CATHETERIZATION N/A 03/22/2016   Procedure: Right Heart Cath;  Surgeon: Jolaine Artist, MD;  Location: [facility];  Service: Cardiovascular;  Laterality: N/A;  . CARDIAC DEFIBRILLATOR PLACEMENT    . CARDIAC DEFIBRILLATOR PLACEMENT    . CHOLECYSTECTOMY N/A 08/30/2015   Procedure: LAPAROSCOPIC CHOLECYSTECTOMY;  Surgeon: Hubbard Robinson, MD;  Location: [name];  Service: General;  Laterality:  N/A;  . COLONOSCOPY WITH PROPOFOL N/A 08/18/2016   Procedure: COLONOSCOPY WITH PROPOFOL;  Surgeon: Jerene Bears, MD;  Location: [facility];  Service: Gastroenterology;  Laterality: N/A;  . CORONARY ARTERY BYPASS GRAFT  2001  . INSERT / REPLACE / REMOVE PACEMAKER    . IR GENERIC HISTORICAL  08/03/2016   IR FLUORO GUIDE CV LINE RIGHT 08/03/2016 [name], MD  [facility]  . LUMBAR DISC SURGERY  02/1999   Discectomy and fusion  . VASECTOMY     Subsequent reversal       Home Medications    Prior to Admission medications   Medication Sig Start Date End Date Taking? Authorizing Provider  acetaminophen (TYLENOL) 500 MG tablet Take 1,000 mg by mouth every 6 (six) hours as needed for mild pain or headache.   Yes Historical Provider, MD  albuterol (PROVENTIL HFA;VENTOLIN HFA) 108 (90 BASE) MCG/ACT inhaler Inhale 2 puffs into the lungs every 6 (six) hours as needed for wheezing or shortness of breath.   Yes Historical Provider, MD  aspirin EC 81 MG tablet Take 81 mg by mouth daily.    Yes Historical Provider, MD  atorvastatin (LIPITOR) 20 MG tablet Take 20 mg by mouth at bedtime.    Yes Historical Provider, MD  baclofen (LIORESAL) 10 MG tablet Take 10 mg by mouth 3 (three) times daily as needed for muscle spasms.   Yes Historical Provider, MD  digoxin (LANOXIN) 0.25 MG tablet TAKE 1 TABLET EVERY DAY 07/27/16  Yes Jolaine Artist, MD  escitalopram (LEXAPRO) 20 MG tablet Take 1 tablet (20 mg total) by mouth daily. 09/02/15  Yes Vipul Manuella Ghazi, MD  insulin aspart (NOVOLOG FLEXPEN) 100 UNIT/ML FlexPen Inject 10 Units into the skin 3 (three) times daily with meals.   Yes Historical Provider, MD  insulin glargine (LANTUS) 100 unit/mL SOPN Inject 48 Units into the skin at bedtime.   Yes Historical Provider, MD  isosorbide mononitrate (IMDUR) 30 MG 24 hr tablet Take 1 tablet (30 mg total) by mouth daily. 08/10/16  Yes Jolaine Artist, MD  losartan (COZAAR) 25 MG tablet Take 0.5 tablets (12.5 mg total) by mouth 2 (two) times daily. 08/04/16  Yes [name] Friar, PA-C  metolazone (ZAROXOLYN) 5 MG tablet Take 5 mg by mouth daily as needed (for 4-5 lb weight gain).    Yes Historical Provider, MD  metoprolol succinate (TOPROL-XL) 100 MG 24 hr tablet Take 1 tablet (100 mg total) by mouth daily. Take with or immediately following a meal. 07/28/16  Yes Jolaine Artist, MD  milrinone (PRIMACOR) 20 MG/100 ML SOLN infusion Inject 41.025 mcg/min into the vein continuous. 06/30/16  Yes Jolaine Artist, MD  Multiple Vitamin (MULTIVITAMIN WITH MINERALS) TABS tablet Take 1 tablet by mouth daily. Sentry/Centrum   Yes Historical Provider, MD  Omega-3 Fatty Acids (FISH OIL) 1000 MG CAPS Take 1,000 mg by mouth daily with supper.    Yes Historical Provider, MD  potassium chloride SA (K-DUR,KLOR-CON) 20 MEQ tablet Take 2 tablets (40 mEq total) by mouth 3 (three) times daily. Patient taking differently: Take 40 mEq by mouth See admin instructions. Take 2 tablets (40 mg) by mouth 3 times daily, may also take 1 extra tablet (20 mg)if taking extra torsemide due to 3-5 lbs weight gain 08/12/16  Yes Amy D Clegg, NP  senna (SENOKOT) 8.6 MG TABS tablet Take 2 tablets (17.2 mg total) by mouth at bedtime as needed. Contact PCP for further refills, this is a courtesy refill  Patient taking differently: Take 2 tablets by mouth at bedtime as needed (constipation).  08/06/16  Yes [name] Friar, PA-C  spironolactone (ALDACTONE) 25 MG tablet Take 1 tablet (25 mg total) by mouth daily. 08/10/16  Yes Jolaine Artist, MD  torsemide (DEMADEX) 20 MG tablet Take 2 tablets (40 mg total) by mouth 2 (two) times daily. Patient taking differently: Take 40 mg by mouth See admin instructions. Take 2 tablets (40 mg) by mouth twice daily, may also take another 2 tablets (40 mg) as needed for weight gain of 3-5 lbs 06/08/16  Yes Jolaine Artist, MD  varenicline (CHANTIX) 1 MG tablet Take 1 mg by mouth 2 (two) times daily.   Yes Historical Provider, MD  zolpidem (AMBIEN) 5 MG tablet Take 1 tablet (5 mg total) by mouth at bedtime as needed for sleep. 01/18/16  Yes Lytle Butte, MD  gabapentin (NEURONTIN) 100 MG capsule Take 100 mg by mouth. 08/17/16   Historical Provider, MD  nicotine (NICODERM CQ - DOSED IN MG/24 HOURS) 21 mg/24hr patch Place 1 patch (21 mg total) onto the skin  daily. Patient not taking: Reported on 08/22/2016 07/30/16   Jolaine Artist, MD    Family History Family History  Problem Relation Age of Onset  . CAD Father   . Diabetes Father   . Heart failure Father     Social History Social History  Substance Use Topics  . Smoking status: Former Smoker    Packs/day: 1.50    Years: 23.00    Types: Cigarettes    Quit date: 08/27/2015  . Smokeless tobacco: Never Used  . Alcohol use No     Allergies   Codeine and Lipitor [atorvastatin]   Review of Systems Review of Systems  Constitutional: Positive for fatigue. Negative for chills and fever.  HENT: Negative for ear pain and sore throat.   Eyes: Negative for pain and visual disturbance.  Respiratory: Positive for shortness of breath. Negative for cough.   Cardiovascular: Negative for chest pain and palpitations.  Gastrointestinal: Positive for abdominal distention, nausea and vomiting. Negative for abdominal pain.  Genitourinary: Positive for decreased urine volume. Negative for dysuria.  Musculoskeletal: Negative for arthralgias and back pain.  Skin: Positive for wound. Negative for rash.  Neurological: Negative for seizures and syncope.  Psychiatric/Behavioral: Negative for confusion.  All other systems reviewed and are negative.    Physical Exam Updated Vital Signs BP (!) 89/64 (BP Location: Left Arm)   Pulse 77   Temp 98.4 F (36.9 C) (Oral)   Resp 15   Ht 5\' 9"  (1.753 m)   Wt 103.9 kg   SpO2 96%   BMI 33.82 kg/m   Physical Exam  Constitutional: He appears well-developed and well-nourished.  HENT:  Head: Normocephalic and atraumatic.  Eyes: Conjunctivae are normal.  Neck: Neck supple. JVD present.  Cardiovascular: Normal rate and regular rhythm.   No murmur heard. Pulmonary/Chest: Effort normal and breath sounds normal. No respiratory distress. He has no wheezes. He has no rales. He exhibits no tenderness.  Abdominal: Soft. He exhibits distension. There is  no tenderness.  Musculoskeletal: He exhibits edema (trace LE edema).  Superficial wound to right shin with small surrounding erythema  Neurological: He is alert.  Skin: Skin is warm and dry.  Psychiatric: He has a normal mood and affect.  Nursing note and vitals reviewed.    ED Treatments / Results  Labs (all labs ordered are listed, but only abnormal results are displayed) Labs  Reviewed  BASIC METABOLIC PANEL - Abnormal; Notable for the following:       Result Value   Sodium 134 (*)    Potassium 3.3 (*)    Chloride 95 (*)    Glucose, Bld 227 (*)    BUN 21 (*)    All other components within normal limits  CBC - Abnormal; Notable for the following:    RBC 3.95 (*)    Hemoglobin 12.4 (*)    HCT 36.8 (*)    Platelets 107 (*)    All other components within normal limits  BRAIN NATRIURETIC PEPTIDE - Abnormal; Notable for the following:    B Natriuretic Peptide 1,208.4 (*)    All other components within normal limits  DIGOXIN LEVEL - Abnormal; Notable for the following:    Digoxin Level 0.7 (*)    All other components within normal limits  GLUCOSE, CAPILLARY - Abnormal; Notable for the following:    Glucose-Capillary 194 (*)    All other components within normal limits  GLUCOSE, CAPILLARY - Abnormal; Notable for the following:    Glucose-Capillary 255 (*)    All other components within normal limits  MRSA PCR SCREENING  BASIC METABOLIC PANEL  [place], ED    EKG  EKG Interpretation None       Radiology Dg Chest 2 View  Result Date: 08/22/2016 CLINICAL DATA:  Chest pain and shortness of breath EXAM: CHEST  2 VIEW COMPARISON:  June 20, 2016 FINDINGS: Central catheter tip is in the superior vena cava. No pneumothorax. There is no edema or consolidation. Heart is mildly enlarged with pulmonary vascularity within normal limits. Pacemaker leads are attached the right atrium, right ventricle, and left ventricle. No  adenopathy. Patient is status post coronary artery bypass grafting. No bone lesions. IMPRESSION: Central catheter tip in superior vena cava. No pneumothorax. No edema or consolidation. Stable cardiomegaly. Electronically Signed   By: Lowella Grip III M.D.   On: 08/22/2016 16:02    Procedures Procedures (including critical care time)  Medications Ordered in ED Medications  potassium chloride SA (K-DUR,KLOR-CON) CR tablet 40 mEq (40 mEq Oral Given 08/22/16 2159)  isosorbide mononitrate (IMDUR) 24 hr tablet 30 mg (not administered)  spironolactone (ALDACTONE) tablet 25 mg (not administered)  senna (SENOKOT) tablet 17.2 mg (not administered)  losartan (COZAAR) tablet 12.5 mg (12.5 mg Oral Given 08/22/16 2158)  metoprolol succinate (TOPROL-XL) 24 hr tablet 100 mg (not administered)  digoxin (LANOXIN) tablet 250 mcg (not administered)  milrinone (PRIMACOR) 20 MG/100 ML (0.2 mg/mL) infusion (0.375 mcg/kg/min  109.4 kg Intravenous New Bag/Given 08/22/16 2024)  multivitamin with minerals tablet 1 tablet (not administered)  atorvastatin (LIPITOR) tablet 20 mg (20 mg Oral Given 08/22/16 2157)  insulin glargine (LANTUS) injection 40 Units (40 Units Subcutaneous Given 08/22/16 2204)  metolazone (ZAROXOLYN) tablet 5 mg (not administered)  baclofen (LIORESAL) tablet 10 mg (not administered)  zolpidem (AMBIEN) tablet 5 mg (not administered)  acetaminophen (TYLENOL) tablet 1,000 mg (not administered)  aspirin EC tablet 81 mg (not administered)  escitalopram (LEXAPRO) tablet 20 mg (not administered)  albuterol (PROVENTIL) (2.5 MG/3ML) 0.083% nebulizer solution 3 mL (not administered)  sodium chloride flush (NS) 0.9 % injection 3 mL (3 mLs Intravenous Given 08/22/16 2206)  sodium chloride flush (NS) 0.9 % injection 3 mL (3 mLs Intravenous Given 08/22/16 2024)  0.9 %  sodium chloride infusion (not administered)  ondansetron (ZOFRAN) injection 4 mg (not administered)  heparin injection 5,000 Units  (  5,000 Units Subcutaneous Given 08/22/16 2158)  furosemide (LASIX) injection 80 mg (40 mg Intravenous Given 08/22/16 2158)  insulin aspart (novoLOG) injection 0-15 Units (not administered)  insulin aspart (novoLOG) injection 0-5 Units (3 Units Subcutaneous Given 08/23/16 0039)  vancomycin (VANCOCIN) IVPB 750 mg/150 ml premix (not administered)  nicotine (NICODERM CQ - dosed in mg/24 hours) patch 21 mg (21 mg Transdermal Patch Applied 08/22/16 2249)  sodium chloride flush (NS) 0.9 % injection 10-40 mL (not administered)  ondansetron (ZOFRAN) injection 4 mg (4 mg Intravenous Given 08/22/16 1643)  vancomycin (VANCOCIN) 2,000 mg in sodium chloride 0.9 % 500 mL IVPB (2,000 mg Intravenous Given 08/22/16 2023)  furosemide (LASIX) injection 40 mg (40 mg Intravenous Given 08/23/16 0020)     Initial Impression / Assessment and Plan / ED Course  I have reviewed the triage vital signs and the nursing notes.  Pertinent labs & imaging results that were available during my care of the patient were reviewed by me and considered in my medical decision making (see chart for details).  Clinical Course     Patient is a 60 year old male with past history as above who presents with weight gain, nausea, and abdominal distention, concern for heart failure exacerbation. Afebrile, VSS on arrival. Exam as above. EKG shows AV paced rhythm with right bundle branch block. Chest x-ray shows adequate PICC line placement and no significant pulmonary edema or infiltrates. BNP elevated at 1208. Initial troponin unremarkable. Cardiology consulted to evaluate patient. Plan to admit to their service for further diuresis and management.  Patient seen and discussed with Dr. Reather Converse, ED attending  Final Clinical Impressions(s) / ED Diagnoses   Final diagnoses:  Acute on chronic systolic congestive heart failure [facility])    New Prescriptions Current Discharge Medication List       Gibson Ramp, MD 08/23/16 0041    Elnora Morrison, MD 08/23/16 [phone]

## 2016-08-22 NOTE — ED Notes (Signed)
Report attempted 

## 2016-08-22 NOTE — H&P (Addendum)
ADMISSION HISTORY & PHYSICAL   Chief Complaint:  Dyspnea, weight gain, N/V and abdominal distention  Cardiologist: Dr. Haroldine Laws (Advanced CHF service)  Primary Care Physician: [facility]  HPI:  This is a 60 y.o. male with a past medical history significant for chronic systolic CHF with EF 63-89% by echo in 10/2015, mildly dilated RV, s/p medtronic AICD in 2014, CAD s/p CABG in 2000, HTN, remote cocaine abuse, smoking, depression and COPD. He has been admitted twice in May of this year for decompensated CHF with impressive diuresis (from 247 ->219 lbs) and subsequently later in the month with diuresis again down to 228 lbs. He was again admitted with CHF exacerbation in 03/2016 with low cardiac output and biventricular failure. He was started on milrinone gtts and sent home on 0.25 mcg/kg/min. He diuresed 14 lbs to a weight of 227 lbs. Most recent LVEF on echo was 20% with moderate to severe RV hypokinesis. He was referred to Dr. Hoyt Koch at [facility] for transplant work-up, but apparently this was put on hold due to ongoing tobacco use and + cocaine drug screen.  He now presents with 12 lbs weight gain, Class 4 dyspnea, nausea and vomiting as well as abdominal distention. He tried taking extra diuretics at home according to his protocol but that was not successful. He reports adequate milrinone and there appears to be no problems with the PICC line and its' ability to infuse. CXR shows adequate placement and no pulmonary edema. EKG shows AV paced rhythm with a RBBB pattern. BNP is elevated at 1208. Initial troponin is negative. UDS on 11/17 was negative for cocaine.  Weight is recorded at 225 and 232 in the ER- not clear which is accurate. He also is noted to have hit his right shin on something - there is a partially open sore with surrounding erythema and warmth concerning for possible cellulitis.  PMHx:  Past Medical History:  Diagnosis Date  . AICD (automatic  cardioverter/defibrillator) present   . ASCVD (arteriosclerotic cardiovascular disease)   . Chronic systolic CHF (congestive heart failure) ([place])   . COPD (chronic obstructive pulmonary disease) ([place])   . Coronary artery disease   . Depression   . Diabetes mellitus   . History of cocaine abuse   . Hypertension   . Shortness of breath dyspnea   . Suicidal ideation   . Tobacco abuse     Past Surgical History:  Procedure Laterality Date  . CARDIAC CATHETERIZATION N/A 02/12/2016   Procedure: Right/Left Heart Cath and Coronary/Graft Angiography;  Surgeon: Jolaine Artist, MD;  Location: [place] [facility];  Service: Cardiovascular;  Laterality: N/A;  . CARDIAC CATHETERIZATION N/A 03/22/2016   Procedure: Right Heart Cath;  Surgeon: Jolaine Artist, MD;  Location: [place] [facility];  Service: Cardiovascular;  Laterality: N/A;  . CARDIAC DEFIBRILLATOR PLACEMENT    . CARDIAC DEFIBRILLATOR PLACEMENT    . CHOLECYSTECTOMY N/A 08/30/2015   Procedure: LAPAROSCOPIC CHOLECYSTECTOMY;  Surgeon: Hubbard Robinson, MD;  Location: [name];  Service: General;  Laterality: N/A;  . COLONOSCOPY WITH PROPOFOL N/A 08/18/2016   Procedure: COLONOSCOPY WITH PROPOFOL;  Surgeon: Jerene Bears, MD;  Location: [facility];  Service: Gastroenterology;  Laterality: N/A;  . CORONARY ARTERY BYPASS GRAFT  2001  . INSERT / REPLACE / REMOVE PACEMAKER    . IR GENERIC HISTORICAL  08/03/2016   IR FLUORO GUIDE CV LINE RIGHT 08/03/2016 [name], MD [facility]-INTER[facility] RAD  . [place] SURGERY  02/1999  Discectomy and fusion  . VASECTOMY     Subsequent reversal    FAMHx:  Family History  Problem Relation Age of Onset  . CAD Father   . Diabetes Father   . Heart failure Father     SOCHx:   reports that he quit smoking about a year ago. His smoking use included Cigarettes. He has a 34.50 pack-year smoking history. He has never used smokeless tobacco. He reports that he does not drink alcohol or use  drugs.  ALLERGIES:  Allergies  Allergen Reactions  . Codeine Nausea And Vomiting  . Lipitor [Atorvastatin] Nausea Only    ROS: Pertinent items noted in HPI and remainder of comprehensive ROS otherwise negative.  HOME MEDS: No current facility-administered medications on file prior to encounter.    Current Outpatient Prescriptions on File Prior to Encounter  Medication Sig Dispense Refill  . acetaminophen (TYLENOL) 500 MG tablet Take 1,000 mg by mouth every 6 (six) hours as needed for mild pain or headache.    . albuterol (PROVENTIL HFA;VENTOLIN HFA) 108 (90 BASE) MCG/ACT inhaler Inhale 2 puffs into the lungs every 6 (six) hours as needed for wheezing or shortness of breath.    Marland Kitchen aspirin EC 81 MG tablet Take 81 mg by mouth every morning.     Marland Kitchen atorvastatin (LIPITOR) 20 MG tablet Take 20 mg by mouth daily.     . baclofen (LIORESAL) 10 MG tablet Take 10 mg by mouth 3 (three) times daily as needed for muscle spasms.    . digoxin (LANOXIN) 0.25 MG tablet TAKE 1 TABLET EVERY DAY 90 tablet 1  . escitalopram (LEXAPRO) 20 MG tablet Take 1 tablet (20 mg total) by mouth daily. 30 tablet 0  . insulin aspart (NOVOLOG) 100 UNIT/ML injection Inject 10 Units into the skin 3 (three) times daily before meals.    . insulin glargine (LANTUS) 100 UNIT/ML injection Inject 40 Units into the skin at bedtime.    . isosorbide mononitrate (IMDUR) 30 MG 24 hr tablet Take 1 tablet (30 mg total) by mouth daily. 90 tablet 3  . losartan (COZAAR) 25 MG tablet Take 0.5 tablets (12.5 mg total) by mouth 2 (two) times daily. 180 tablet 1  . metolazone (ZAROXOLYN) 5 MG tablet Take 5 mg by mouth as needed (if weight >229 lb).    . metoprolol succinate (TOPROL-XL) 100 MG 24 hr tablet Take 1 tablet (100 mg total) by mouth daily. Take with or immediately following a meal. 90 tablet 3  . milrinone (PRIMACOR) 20 MG/100 ML SOLN infusion Inject 41.025 mcg/min into the vein continuous. 1000 mL 0  . Multiple Vitamins-Minerals  (SENTRY ADULT) TABS Take 1 tablet by mouth daily.    . Na Sulfate-K Sulfate-Mg Sulf 17.5-3.13-1.6 GM/180ML SOLN Take 1 kit by mouth as directed. 354 mL 0  . nicotine (NICODERM CQ - DOSED IN MG/24 HOURS) 21 mg/24hr patch Place 1 patch (21 mg total) onto the skin daily. 28 patch 0  . Omega-3 Fatty Acids (FISH OIL) 1000 MG CAPS Take 1,000 mg by mouth daily.    . potassium chloride SA (K-DUR,KLOR-CON) 20 MEQ tablet Take 2 tablets (40 mEq total) by mouth 3 (three) times daily. 360 tablet 1  . senna (SENOKOT) 8.6 MG TABS tablet Take 2 tablets (17.2 mg total) by mouth at bedtime as needed. Contact PCP for further refills, this is a courtesy refill 180 each 0  . spironolactone (ALDACTONE) 25 MG tablet Take 1 tablet (25 mg total) by mouth daily. [place]  tablet 1  . torsemide (DEMADEX) 20 MG tablet Take 2 tablets (40 mg total) by mouth 2 (two) times daily. 360 tablet 3  . zolpidem (AMBIEN) 5 MG tablet Take 1 tablet (5 mg total) by mouth at bedtime as needed for sleep. 30 tablet 0    LABS/IMAGING: Results for orders placed or performed during the hospital encounter of 08/22/16 (from the past 48 hour(s))  Basic metabolic panel     Status: Abnormal   Collection Time: 08/22/16  3:14 PM  Result Value Ref Range   Sodium 134 (L) 135 - 145 mmol/L   Potassium 3.3 (L) 3.5 - 5.1 mmol/L   Chloride 95 (L) 101 - 111 mmol/L   CO2 28 22 - 32 mmol/L   Glucose, Bld 227 (H) 65 - 99 mg/dL   BUN 21 (H) 6 - 20 mg/dL   Creatinine, Ser 1.23 0.61 - 1.24 mg/dL   Calcium 8.9 8.9 - 10.3 mg/dL   GFR calc non Af Amer >60 >60 mL/min   GFR calc Af Amer >60 >60 mL/min    Comment: (NOTE) The eGFR has been calculated using the CKD EPI equation. This calculation has not been validated in all clinical situations. eGFR's persistently <60 mL/min signify possible Chronic Kidney Disease.    Anion gap 11 5 - 15  CBC     Status: Abnormal   Collection Time: 08/22/16  3:14 PM  Result Value Ref Range   WBC 7.8 4.0 - 10.5 K/uL   RBC 3.95  (L) 4.22 - 5.81 MIL/uL   Hemoglobin 12.4 (L) 13.0 - 17.0 g/dL   HCT 36.8 (L) 39.0 - 52.0 %   MCV 93.2 78.0 - 100.0 fL   MCH 31.4 26.0 - 34.0 pg   MCHC 33.7 30.0 - 36.0 g/dL   RDW 13.5 11.5 - 15.5 %   Platelets 107 (L) 150 - 400 K/uL    Comment: PLATELET COUNT CONFIRMED BY SMEAR LARGE PLATELETS PRESENT   Brain natriuretic peptide     Status: Abnormal   Collection Time: 08/22/16  3:14 PM  Result Value Ref Range   B Natriuretic Peptide 1,208.4 (H) 0.0 - 100.0 pg/mL  I-stat troponin, ED     Status: None   Collection Time: 08/22/16  3:25 PM  Result Value Ref Range   Troponin i, poc 0.01 0.00 - 0.08 ng/mL   Comment 3            Comment: Due to the release kinetics of cTnI, a negative result within the first hours of the onset of symptoms does not rule out myocardial infarction with certainty. If myocardial infarction is still suspected, repeat the test at appropriate intervals.    Dg Chest 2 View  Result Date: 08/22/2016 CLINICAL DATA:  Chest pain and shortness of breath EXAM: CHEST  2 VIEW COMPARISON:  June 20, 2016 FINDINGS: Central catheter tip is in the superior vena cava. No pneumothorax. There is no edema or consolidation. Heart is mildly enlarged with pulmonary vascularity within normal limits. Pacemaker leads are attached the right atrium, right ventricle, and left ventricle. No adenopathy. Patient is status post coronary artery bypass grafting. No bone lesions. IMPRESSION: Central catheter tip in superior vena cava. No pneumothorax. No edema or consolidation. Stable cardiomegaly. Electronically Signed   By: Lowella Grip III M.D.   On: 08/22/2016 16:02    VITALS: Vitals:   08/22/16 1600 08/22/16 1615  BP: 94/66 94/70  Pulse: 66 64  Resp: 13 20  Temp:  EXAM: General appearance: alert, no distress and pale, mildly proptotic Neck: JVD - 6 cm above sternal notch and no carotid bruit Lungs: diminished breath sounds bibasilar Heart: regular rate and rhythm,  S1, S2 normal and S3 present Abdomen: soft, non-tender; bowel sounds normal; no masses,  no organomegaly and distended, +AJR Extremities: edema trace pedal, 1 cm round avulsion wound of the right shin which is partially scabbed, there is 3-4 cm of surrounding erythma and warmth without clear exudate Pulses: 1+ pulses Skin: pale, warm, dry Neurologic: Mental status: Alert, oriented, thought content appropriate Psych: Pleasant  IMPRESSION: Principal Problem:   Acute systolic congestive heart failure, NYHA class 4 (HCC) Active Problems:   Emesis   Dyslipidemia associated with type 2 diabetes mellitus (HCC)   Cellulitis and abscess of leg   PLAN: 1. Acute systolic congestive heart failure - he notes 12 lbs weight gain, nausea, vomiting and abdominal bloating more suggestive of acute right heart failure. Milrinone seems to be working. He took extra metolazone with little benefit. Will start on IV lasix 80 mg BID - continue metolazone. Continue home dose milrinone. Will ask advanced CHF service to see in the am. 2. Right LE cellulitis - he is diabetic and there is a right shin wound. Will need coverage for MRSA given his history of this. He may not tolerate oral meds d/t nausea and vomiting, therefore, start IV vancomycin. 3. DM2- continue home diabetes regimen and SSI while admitted 4. Dyslipidemia - continue home lipitor  FULL CODE - was recently partial code, but since he has been doing better on milrinone, wants everything done.  Pixie Casino, MD, [facility] Attending Cardiologist [place] 08/22/2016, 5:05 PM

## 2016-08-22 NOTE — Telephone Encounter (Signed)
Ronald Miller. is a 61 y.o. male with systolic CHF, CAD, s/p ICD on home milrinone.  HHRN called.  Ronald Miller has a 12 pound weight gain that is not responding to "diuretic protocol" with associated NYHA 3-4 dyspnea, nausea and vomiting, abdominal distention. I have advised Rip Harbour the RN that the patient needs to go to the ED. She agrees to help Ronald Miller arrange this today. Richardson Dopp, PA-C   08/22/2016 11:50 AM

## 2016-08-22 NOTE — ED Notes (Signed)
Patient transported to X-ray 

## 2016-08-22 NOTE — ED Notes (Signed)
Patient transported back from X-ray 

## 2016-08-23 LAB — COOXEMETRY PANEL
CARBOXYHEMOGLOBIN: 1.6 % — AB (ref 0.5–1.5)
Methemoglobin: 0.7 % (ref 0.0–1.5)
O2 SAT: 54.7 %
Total hemoglobin: 10.4 g/dL — ABNORMAL LOW (ref 12.0–16.0)

## 2016-08-23 LAB — GLUCOSE, CAPILLARY
GLUCOSE-CAPILLARY: 224 mg/dL — AB (ref 65–99)
GLUCOSE-CAPILLARY: 230 mg/dL — AB (ref 65–99)
Glucose-Capillary: 183 mg/dL — ABNORMAL HIGH (ref 65–99)
Glucose-Capillary: 217 mg/dL — ABNORMAL HIGH (ref 65–99)
Glucose-Capillary: 255 mg/dL — ABNORMAL HIGH (ref 65–99)
Glucose-Capillary: 304 mg/dL — ABNORMAL HIGH (ref 65–99)

## 2016-08-23 LAB — BASIC METABOLIC PANEL
Anion gap: 10 (ref 5–15)
BUN: 26 mg/dL — ABNORMAL HIGH (ref 6–20)
CALCIUM: 8.6 mg/dL — AB (ref 8.9–10.3)
CHLORIDE: 93 mmol/L — AB (ref 101–111)
CO2: 32 mmol/L (ref 22–32)
CREATININE: 1.51 mg/dL — AB (ref 0.61–1.24)
GFR calc non Af Amer: 49 mL/min — ABNORMAL LOW (ref >60)
GFR, EST AFRICAN AMERICAN: 57 mL/min — AB (ref >60)
GLUCOSE: 272 mg/dL — AB (ref 65–99)
Potassium: 3.2 mmol/L — ABNORMAL LOW (ref 3.5–5.1)
Sodium: 135 mmol/L (ref 135–145)

## 2016-08-23 MED ORDER — INSULIN ASPART 100 UNIT/ML ~~LOC~~ SOLN
5.0000 [IU] | Freq: Three times a day (TID) | SUBCUTANEOUS | Status: DC
  Administered 2016-08-23 – 2016-08-27 (×9): 5 [IU] via SUBCUTANEOUS
  Administered 2016-08-27: 8 [IU] via SUBCUTANEOUS
  Administered 2016-08-28 – 2016-08-30 (×7): 5 [IU] via SUBCUTANEOUS

## 2016-08-23 MED ORDER — METOLAZONE 5 MG PO TABS
5.0000 mg | ORAL_TABLET | Freq: Two times a day (BID) | ORAL | Status: DC
  Administered 2016-08-23 – 2016-08-24 (×3): 5 mg via ORAL
  Filled 2016-08-23 (×3): qty 1

## 2016-08-23 MED ORDER — GABAPENTIN 100 MG PO CAPS
100.0000 mg | ORAL_CAPSULE | Freq: Two times a day (BID) | ORAL | Status: DC
  Administered 2016-08-23 – 2016-09-08 (×32): 100 mg via ORAL
  Filled 2016-08-23 (×33): qty 1

## 2016-08-23 MED ORDER — FUROSEMIDE 10 MG/ML IJ SOLN
40.0000 mg | Freq: Once | INTRAMUSCULAR | Status: AC
  Administered 2016-08-23: 40 mg via INTRAVENOUS

## 2016-08-23 MED ORDER — POTASSIUM CHLORIDE CRYS ER 20 MEQ PO TBCR
40.0000 meq | EXTENDED_RELEASE_TABLET | Freq: Two times a day (BID) | ORAL | Status: DC
  Administered 2016-08-23 (×2): 40 meq via ORAL
  Filled 2016-08-23: qty 2

## 2016-08-23 MED ORDER — POTASSIUM CHLORIDE CRYS ER 20 MEQ PO TBCR
40.0000 meq | EXTENDED_RELEASE_TABLET | Freq: Once | ORAL | Status: AC
  Administered 2016-08-23: 40 meq via ORAL
  Filled 2016-08-23: qty 2

## 2016-08-23 NOTE — Progress Notes (Signed)
[facility]  Active pt with [facility] prior to this admission  [facility] providing HHRN and Home Inotrope team for home Milrinone.  [facility] hospital team will follow pt while here to support transition home when ordered to support home care needs.   If patient discharges after hours, please call [phone].   Ronald Miller 08/23/2016, 9:18 AM

## 2016-08-23 NOTE — Consult Note (Addendum)
[place] Nurse wound consult note Reason for Consult: Consult requested for right leg; pt states he recently hit it on an object. Wound type: Partial thickness abrasion Measurement: .2X.2X.1cm Wound bed: removed outer scabbed layer, revealing pink moist wound bed with small amt pink drainage Periwound: Generalized edema and erythremia to right calf related to cellulitis Dressing procedure/placement/frequency: Pt is on antibiotic coverage for cellulitis. Foam dressing to absorb drainage and promote healing. Please re-consult if further assistance is needed.  Thank-you,  Julien Girt MSN, [place], [place], [place], [facility]

## 2016-08-23 NOTE — Progress Notes (Signed)
CARDIAC REHAB PHASE I   PRE:  Rate/Rhythm: 78 paced  BP:  Supine:   Sitting: 101/79  Standing:    SaO2: 91%RA  MODE:  Ambulation: 240 ft   POST:  Rate/Rhythm: 103 paced  BP:  Supine:   Sitting: 113/86  Standing:    SaO2: 94%RA 1115-1153 Pt walked 240 ft on RA with steady gait. Denied SOB. Tolerated well. Sleepy. Back to recliner after walk.   Graylon Good, RN BSN  08/23/2016 11:49 AM

## 2016-08-23 NOTE — Progress Notes (Signed)
Inpatient Diabetes Program Recommendations  AACE/ADA: New Consensus Statement on Inpatient Glycemic Control (2015)  Target Ranges:  Prepandial:   less than 140 mg/dL      Peak postprandial:   less than 180 mg/dL (1-2 hours)      Critically ill patients:  140 - 180 mg/dL   Lab Results  Component Value Date   GLUCAP 304 (H) 08/23/2016   HGBA1C 8.4 (H) 02/10/2016    Review of Glycemic Control  Diabetes history: DM 2 Outpatient Diabetes medications: Lantus 48, Novolog 10 units TID Current orders for Inpatient glycemic control: Lantus 40 units, Novolog Moderate TID + HS  Inpatient Diabetes Program Recommendations:   Glucose increased significantly with meals. Patient takes 10 units of meal coverage at home. Please consider staring Novolog 5 units TID meal coverage if patient consumes at least 50% of meals.  Thanks,  Tama Headings RN, MSN, [facility] Inpatient Diabetes Coordinator Team Pager [phone] (8a-5p)

## 2016-08-23 NOTE — Progress Notes (Signed)
Advanced Heart Failure Rounding Note  PCP: [facility] Primary Cardiologist: Dr. Haroldine Laws   Subjective:    Ronald Miller. is a 60 y.o. male with history of chronic systolic HF s/p Medtronic ICD 2014, CAD s/p CABG in 2000, HTN, Hx of cocaine abuse, Tobacco abuse, Depression, and COPD.   Has been on milrinone at home since 03/2016. Admitted with 12 lb weight gain over weekend and not responding to adjustment of diuretics.  Last Echo 8/17: LVEF 20% RV moderate to severe HK.  Milrinone increased from 0.25 -> 0.375 at end of September with Coox 50%.   Feeling OK this morning. Dyspneic with mild exertion. States he was eating a lot of "smoked [place] sausage" at home over the past few weeks.   Coox 54.7% on 0.375 mcg/kg/min. Creatinine 1.2 -> 1.5. K 3.2 this am.   Objective:   Weight Range: 230 lb 13.2 oz (104.7 kg) Body mass index is 34.09 kg/m.   Vital Signs:   Temp:  [97.5 F (36.4 C)-98.6 F (37 C)] 98.6 F (37 C) (11/20 0753) Pulse Rate:  [55-134] 71 (11/20 0753) Resp:  [11-22] 17 (11/20 0800) BP: (83-105)/(60-73) 105/70 (11/20 0800) SpO2:  [89 %-99 %] 92 % (11/20 0753) FiO2 (%):  [0 %] 0 % (11/19 1948) Weight:  [225 lb (102.1 kg)-232 lb 8 oz (105.5 kg)] 230 lb 13.2 oz (104.7 kg) (11/20 0500) Last BM Date: 08/22/16  Weight change: Filed Weights   08/22/16 1511 08/22/16 2256 08/23/16 0500  Weight: 232 lb 8 oz (105.5 kg) 229 lb (103.9 kg) 230 lb 13.2 oz (104.7 kg)    Intake/Output:   Intake/Output Summary (Last 24 hours) at 08/23/16 0932 Last data filed at 08/23/16 0600  Gross per 24 hour  Intake           501.14 ml  Output              850 ml  Net          -348.86 ml     Physical Exam: General:  Fatigued appearing.  HEENT: Normal Neck: supple. JVP to ear. Carotids 2+ bilat; no bruits. No thyromegaly or nodule noted.  Cor: PMI laterally displaced. RRR. No M/G/R.  Lungs: Mildly diminished basilar sounds.  Abdomen: soft, NT, +distended ,  no HSM. No bruits or masses. +BS  Extremities: no cyanosis, clubbing, rash, 1+ edema R shin with open sore and surrounding erythema Neuro: alert & orientedx3, cranial nerves grossly intact. moves all 4 extremities w/o difficulty. Affect pleasant  Telemetry: NSR  Labs: CBC  Recent Labs  08/22/16 1514  WBC 7.8  HGB 12.4*  HCT 36.8*  MCV 93.2  PLT [id number]*   Basic Metabolic Panel  Recent Labs  08/22/16 1514 08/23/16 0500  NA 134* 135  K 3.3* 3.2*  CL 95* 93*  CO2 28 32  GLUCOSE 227* 272*  BUN 21* 26*  CREATININE 1.23 1.51*  CALCIUM 8.9 8.6*   Liver Function Tests No results for input(s): AST, ALT, ALKPHOS, BILITOT, PROT, ALBUMIN in the last 72 hours. No results for input(s): LIPASE, AMYLASE in the last 72 hours. Cardiac Enzymes No results for input(s): CKTOTAL, CKMB, CKMBINDEX, TROPONINI in the last 72 hours.  BNP: BNP (last 3 results)  Recent Labs  03/10/16 0930 03/19/16 1230 08/22/16 1514  BNP 1,369.8* 1,079.6* 1,208.4*    ProBNP (last 3 results) No results for input(s): PROBNP in the last 8760 hours.   D-Dimer No results for input(s): DDIMER  in the last 72 hours. Hemoglobin A1C No results for input(s): HGBA1C in the last 72 hours. Fasting Lipid Panel No results for input(s): CHOL, HDL, LDLCALC, TRIG, CHOLHDL, LDLDIRECT in the last 72 hours. Thyroid Function Tests No results for input(s): TSH, T4TOTAL, T3FREE, THYROIDAB in the last 72 hours.  Invalid input(s): FREET3  Other results:     Imaging/Studies:  Dg Chest 2 View  Result Date: 08/22/2016 CLINICAL DATA:  Chest pain and shortness of breath EXAM: CHEST  2 VIEW COMPARISON:  June 20, 2016 FINDINGS: Central catheter tip is in the superior vena cava. No pneumothorax. There is no edema or consolidation. Heart is mildly enlarged with pulmonary vascularity within normal limits. Pacemaker leads are attached the right atrium, right ventricle, and left ventricle. No adenopathy. Patient is status  post coronary artery bypass grafting. No bone lesions. IMPRESSION: Central catheter tip in superior vena cava. No pneumothorax. No edema or consolidation. Stable cardiomegaly. Electronically Signed   By: Lowella Grip III M.D.   On: 08/22/2016 16:02    Latest Echo  Latest Cath   Medications:     Scheduled Medications: . aspirin EC  81 mg Oral Daily  . atorvastatin  20 mg Oral QHS  . digoxin  250 mcg Oral Daily  . escitalopram  20 mg Oral Daily  . furosemide  80 mg Intravenous BID  . heparin  5,000 Units Subcutaneous Q8H  . insulin aspart  0-15 Units Subcutaneous TID WC  . insulin aspart  0-5 Units Subcutaneous QHS  . insulin glargine  40 Units Subcutaneous QHS  . isosorbide mononitrate  30 mg Oral Daily  . losartan  12.5 mg Oral BID  . metolazone  5 mg Oral BID  . metoprolol succinate  100 mg Oral Daily  . multivitamin with minerals  1 tablet Oral Daily  . nicotine  21 mg Transdermal Q24H  . potassium chloride  40 mEq Oral BID  . sodium chloride flush  3 mL Intravenous Q12H  . spironolactone  25 mg Oral Daily  . vancomycin  750 mg Intravenous Q12H    Infusions: . milrinone 0.375 mcg/kg/min (08/23/16 0600)    PRN Medications: sodium chloride, acetaminophen, albuterol, baclofen, ondansetron (ZOFRAN) IV, senna, sodium chloride flush, sodium chloride flush, zolpidem   Assessment/Plan   1. Chronic Systolic HF with severe biventricular dysfunction EF 15% due to NICM. Has medtronic ICD.  -NYHA III-IIIb symptoms on milrinone 0.375 mcg/kg/min.   - Volume status markedly elevated. - Continue lasix 80 mg IV BID and agree with adding 5 mg metolazone BID. - Supp K    - Continue losartan 12.5 mg BID. Cannot switch to [facility] now with soft BP - Continue imdur 30 mg.  - Continue digoxin - dig level 0.8 03/20/2016  - Continue spiro 25 mg daily.  - No BB for now with ongoing low output (was not on at home but was started on admit. Will stop) - Will have cardiac rehab see.  -  Now undergoing transplant work-up at [facility].Blood type O+. Transplant listing put on hold due to ongoing tobacco use and + urine tox screen with cocaine (which he denies)  2. AKI on CKD III - Mildly elevated overnight. Continue to follow closely with diuresis.  3. CAD - No CP.  --severe 3v- CAD s/p CABG with occluded grafts as above except for LIMA. No targets for revascularization. No CP.  --Continue ASA, statin  4. HTN - Runs soft chronically.  Follow closely.    5. DMII -  Cover with SSI in house.  6. RLE cellulitis - Covering with Vancomycin for now with poor po tolerance.  7. Tobacco abuse - States he has stopped smoking.   - Nicotine and Cotinine were consistent with tobacco or tobacco cessation product use.   Length of Stay: 1   [name] Friar PA-C 08/23/2016, 9:32 AM  Advanced Heart Failure Team Pager [phone] (M-F; 7a - 4p)  Please contact [facility] for night-coverage after hours (4p -7a ) and weekends on [email or web address]  Patient seen and examined with Oda Kilts, PA-C. We discussed all aspects of the encounter. I agree with the assessment and plan as stated above.   He is volume overloaded and remains very tenuous despite milrinone support. Will continue with IV diuresis. Continue milrinone and dgixoin. Also continue low-dose spiro and losartan. Stop b-blocker. Has been evaluated for OHTx at [facility] but not listed due to tobacco and UDS + for cocaine. Serum cotinine still +.   Cover cellulitis with vancomycin. Can switch to doxy or keflex as outpatient if needed.   Bensimhon, Daniel,MD 12:28 PM

## 2016-08-23 NOTE — Progress Notes (Signed)
Patients O2 sats sustaining low 80's while sleeping. O2 applied 2LNC. Sats improved to 95%. Will continue to monitor.

## 2016-08-24 ENCOUNTER — Inpatient Hospital Stay (HOSPITAL_COMMUNITY): Payer: Medicare HMO

## 2016-08-24 DIAGNOSIS — I509 Heart failure, unspecified: Secondary | ICD-10-CM

## 2016-08-24 DIAGNOSIS — R570 Cardiogenic shock: Secondary | ICD-10-CM

## 2016-08-24 LAB — COOXEMETRY PANEL
CARBOXYHEMOGLOBIN: 1.5 % (ref 0.5–1.5)
CARBOXYHEMOGLOBIN: 1.7 % — AB (ref 0.5–1.5)
Carboxyhemoglobin: 1.4 % (ref 0.5–1.5)
METHEMOGLOBIN: 0.8 % (ref 0.0–1.5)
METHEMOGLOBIN: 0.6 % (ref 0.0–1.5)
METHEMOGLOBIN: 0.8 % (ref 0.0–1.5)
O2 SAT: 41.3 %
O2 SAT: 54.3 %
O2 Saturation: 46.2 %
TOTAL HEMOGLOBIN: 11.8 g/dL — AB (ref 12.0–16.0)
Total hemoglobin: 11.6 g/dL — ABNORMAL LOW (ref 12.0–16.0)
Total hemoglobin: 11.6 g/dL — ABNORMAL LOW (ref 12.0–16.0)

## 2016-08-24 LAB — BASIC METABOLIC PANEL
Anion gap: 11 (ref 5–15)
BUN: 30 mg/dL — ABNORMAL HIGH (ref 6–20)
CALCIUM: 8.7 mg/dL — AB (ref 8.9–10.3)
CHLORIDE: 91 mmol/L — AB (ref 101–111)
CO2: 31 mmol/L (ref 22–32)
CREATININE: 1.47 mg/dL — AB (ref 0.61–1.24)
GFR, EST AFRICAN AMERICAN: 59 mL/min — AB (ref >60)
GFR, EST NON AFRICAN AMERICAN: 50 mL/min — AB (ref >60)
Glucose, Bld: 241 mg/dL — ABNORMAL HIGH (ref 65–99)
Potassium: 3.4 mmol/L — ABNORMAL LOW (ref 3.5–5.1)
SODIUM: 133 mmol/L — AB (ref 135–145)

## 2016-08-24 LAB — GLUCOSE, CAPILLARY
GLUCOSE-CAPILLARY: 153 mg/dL — AB (ref 65–99)
GLUCOSE-CAPILLARY: 130 mg/dL — AB (ref 65–99)
GLUCOSE-CAPILLARY: 296 mg/dL — AB (ref 65–99)
Glucose-Capillary: 278 mg/dL — ABNORMAL HIGH (ref 65–99)

## 2016-08-24 LAB — ECHOCARDIOGRAM COMPLETE
Height: 69 in
WEIGHTICAEL: 3708.8 [oz_av]

## 2016-08-24 LAB — MAGNESIUM: MAGNESIUM: 1.7 mg/dL (ref 1.7–2.4)

## 2016-08-24 LAB — PROTIME-INR
INR: 1.2
Prothrombin Time: 15.2 seconds (ref 11.4–15.2)

## 2016-08-24 MED ORDER — POTASSIUM CHLORIDE CRYS ER 20 MEQ PO TBCR
40.0000 meq | EXTENDED_RELEASE_TABLET | Freq: Once | ORAL | Status: AC
  Administered 2016-08-24: 40 meq via ORAL
  Filled 2016-08-24: qty 2

## 2016-08-24 MED ORDER — SODIUM CHLORIDE 0.9% FLUSH: 3.0000 mL | Freq: Two times a day (BID) | INTRAVENOUS | Status: DC

## 2016-08-24 MED ORDER — POTASSIUM CHLORIDE CRYS ER 20 MEQ PO TBCR
60.0000 meq | EXTENDED_RELEASE_TABLET | Freq: Two times a day (BID) | ORAL | Status: DC
  Administered 2016-08-24: 60 meq via ORAL
  Filled 2016-08-24: qty 3

## 2016-08-24 MED ORDER — PERFLUTREN LIPID MICROSPHERE
INTRAVENOUS | Status: AC
  Administered 2016-08-24: 3 mL via INTRAVENOUS
  Filled 2016-08-24: qty 10

## 2016-08-24 MED ORDER — SODIUM CHLORIDE 0.9 % IV SOLN: 250.0000 mL | INTRAVENOUS | Status: DC | PRN

## 2016-08-24 MED ORDER — PERFLUTREN LIPID MICROSPHERE
1.0000 mL | INTRAVENOUS | Status: AC | PRN
  Administered 2016-08-24: 3 mL via INTRAVENOUS
  Filled 2016-08-24: qty 10

## 2016-08-24 MED ORDER — SODIUM CHLORIDE 0.9% FLUSH: 3.0000 mL | INTRAVENOUS | Status: DC | PRN

## 2016-08-24 MED ORDER — FUROSEMIDE 10 MG/ML IJ SOLN
80.0000 mg | Freq: Three times a day (TID) | INTRAMUSCULAR | Status: DC
Start: 2016-08-24 — End: 2016-08-24

## 2016-08-24 MED ORDER — MAGNESIUM SULFATE 2 GM/50ML IV SOLN
2.0000 g | Freq: Once | INTRAVENOUS | Status: AC
  Administered 2016-08-24: 2 g via INTRAVENOUS
  Filled 2016-08-24: qty 50

## 2016-08-24 MED ORDER — SODIUM CHLORIDE 0.9 % IV SOLN
INTRAVENOUS | Status: DC
Start: 2016-08-25 — End: 2016-08-25
  Administered 2016-08-25: via INTRAVENOUS

## 2016-08-24 MED ORDER — ASPIRIN EC 81 MG PO TBEC
81.0000 mg | DELAYED_RELEASE_TABLET | Freq: Every day | ORAL | Status: DC
  Administered 2016-08-25 – 2016-09-02 (×9): 81 mg via ORAL
  Filled 2016-08-24 (×9): qty 1

## 2016-08-24 NOTE — Progress Notes (Signed)
Pt BP 80s/60s, pt feeling weak and dizzy. Andy from heart failure on floor asked to assess pt. CVP 12-13. Holding lasix at this time. RN will continue to monitor.

## 2016-08-24 NOTE — Progress Notes (Signed)
Inpatient Diabetes Program Recommendations  AACE/ADA: New Consensus Statement on Inpatient Glycemic Control (2015)  Target Ranges:  Prepandial:   less than 140 mg/dL      Peak postprandial:   less than 180 mg/dL (1-2 hours)      Critically ill patients:  140 - 180 mg/dL   Lab Results  Component Value Date   GLUCAP 278 (H) 08/24/2016   HGBA1C 8.4 (H) 02/10/2016    Review of Glycemic Control  Diabetes history: DM 2 Outpatient Diabetes medications: Lantus 48, Novolog 10 units TID Current orders for Inpatient glycemic control: Lantus 40 units, Novolog Moderate TID + HS, Novolog 5 units TID meal coverage  Inpatient Diabetes Program Recommendations:   Glucose increased into the 200's with meals with Novolog 5 meal coverage. Patient takes 10 units of meal coverage at home. Please consider increasing meal coverage to Novolog 8 units TID if patient consumes at least 50% of meals.  Thanks,  Tama Headings RN, MSN, [facility] [place] Inpatient Diabetes Coordinator Team Pager [phone] (8a-5p)

## 2016-08-24 NOTE — Progress Notes (Signed)
Advanced Heart Failure Rounding Note  PCP: [facility] Primary Cardiologist: Dr. Haroldine Laws   Subjective:    Ronald Miller. is a 60 y.o. male with history of chronic systolic HF s/p Medtronic ICD 2014, CAD s/p CABG in 2000, HTN, Hx of cocaine abuse, Tobacco abuse, Depression, and COPD.   Has been on milrinone at home since 03/2016. Admitted with 12 lb weight gain over weekend and not responding to adjustment of diuretics.  Last Echo 8/17: LVEF 20% RV moderate to severe HK.  Milrinone increased from 0.25 -> 0.375 at end of September with Coox 50%.   Fatigued and depressed this am.  Just wants to go home. Feet are bothering him. "burning" pain. Started on Gabapentin.   CVP 15. Coox 41% this am on 0.375 mcg/kg/min. Creatinine 1.2 -> 1.5 -> 1.47. K 3.4 Out 2.3 L but weight up 1 lb.   Objective:   Weight Range: 231 lb 12.8 oz (105.1 kg) Body mass index is 34.23 kg/m.   Vital Signs:   Temp:  [97.7 F (36.5 C)-98.6 F (37 C)] 97.9 F (36.6 C) (11/21 0736) Pulse Rate:  [64-75] 67 (11/21 0736) Resp:  [8-22] 20 (11/21 0736) BP: (86-109)/(66-84) 86/70 (11/21 0736) SpO2:  [91 %-95 %] 94 % (11/21 0736) Weight:  [231 lb 12.8 oz (105.1 kg)] 231 lb 12.8 oz (105.1 kg) (11/21 0323) Last BM Date: 08/22/16  Weight change: Filed Weights   08/22/16 2256 08/23/16 0500 08/24/16 0323  Weight: 229 lb (103.9 kg) 230 lb 13.2 oz (104.7 kg) 231 lb 12.8 oz (105.1 kg)    Intake/Output:   Intake/Output Summary (Last 24 hours) at 08/24/16 0748 Last data filed at 08/24/16 0700  Gross per 24 hour  Intake           1330.5 ml  Output             3175 ml  Net          -1844.5 ml     Physical Exam: CVP 15 General:  Fatigued appearing.  HEENT: Normal Neck: supple. JVP to jaw +. Carotids 2+ bilat; no bruits. No thyromegaly or nodule noted.  Cor: PMI laterally displaced. RRR. No M/G/R.  Lungs: Diminished basilar sounds  Abdomen: soft, NT, +distended , no HSM. No bruits or  masses. +BS  Extremities: no cyanosis, clubbing, rash, Trace to 1+ edema. R shin with open sore and surrounding erythema Neuro: alert & orientedx3, cranial nerves grossly intact. moves all 4 extremities w/o difficulty. Affect pleasant  Telemetry: NSR  Labs: CBC  Recent Labs  08/22/16 1514  WBC 7.8  HGB 12.4*  HCT 36.8*  MCV 93.2  PLT [id number]*   Basic Metabolic Panel  Recent Labs  08/23/16 0500 08/24/16 0339  NA 135 133*  K 3.2* 3.4*  CL 93* 91*  CO2 32 31  GLUCOSE 272* 241*  BUN 26* 30*  CREATININE 1.51* 1.47*  CALCIUM 8.6* 8.7*   Liver Function Tests No results for input(s): AST, ALT, ALKPHOS, BILITOT, PROT, ALBUMIN in the last 72 hours. No results for input(s): LIPASE, AMYLASE in the last 72 hours. Cardiac Enzymes No results for input(s): CKTOTAL, CKMB, CKMBINDEX, TROPONINI in the last 72 hours.  BNP: BNP (last 3 results)  Recent Labs  03/10/16 0930 03/19/16 1230 08/22/16 1514  BNP 1,369.8* 1,079.6* 1,208.4*    ProBNP (last 3 results) No results for input(s): PROBNP in the last 8760 hours.   D-Dimer No results for input(s): DDIMER in the  last 72 hours. Hemoglobin A1C No results for input(s): HGBA1C in the last 72 hours. Fasting Lipid Panel No results for input(s): CHOL, HDL, LDLCALC, TRIG, CHOLHDL, LDLDIRECT in the last 72 hours. Thyroid Function Tests No results for input(s): TSH, T4TOTAL, T3FREE, THYROIDAB in the last 72 hours.  Invalid input(s): FREET3  Other results:     Imaging/Studies:  Dg Chest 2 View  Result Date: 08/22/2016 CLINICAL DATA:  Chest pain and shortness of breath EXAM: CHEST  2 VIEW COMPARISON:  June 20, 2016 FINDINGS: Central catheter tip is in the superior vena cava. No pneumothorax. There is no edema or consolidation. Heart is mildly enlarged with pulmonary vascularity within normal limits. Pacemaker leads are attached the right atrium, right ventricle, and left ventricle. No adenopathy. Patient is status post  coronary artery bypass grafting. No bone lesions. IMPRESSION: Central catheter tip in superior vena cava. No pneumothorax. No edema or consolidation. Stable cardiomegaly. Electronically Signed   By: Lowella Grip III M.D.   On: 08/22/2016 16:02    Latest Echo  Latest Cath   Medications:     Scheduled Medications: . aspirin EC  81 mg Oral Daily  . atorvastatin  20 mg Oral QHS  . digoxin  250 mcg Oral Daily  . escitalopram  20 mg Oral Daily  . furosemide  80 mg Intravenous BID  . gabapentin  100 mg Oral BID  . heparin  5,000 Units Subcutaneous Q8H  . insulin aspart  0-15 Units Subcutaneous TID WC  . insulin aspart  0-5 Units Subcutaneous QHS  . insulin aspart  5 Units Subcutaneous TID WC  . insulin glargine  40 Units Subcutaneous QHS  . isosorbide mononitrate  30 mg Oral Daily  . losartan  12.5 mg Oral BID  . metolazone  5 mg Oral BID  . multivitamin with minerals  1 tablet Oral Daily  . nicotine  21 mg Transdermal Q24H  . potassium chloride  40 mEq Oral BID  . sodium chloride flush  3 mL Intravenous Q12H  . spironolactone  25 mg Oral Daily  . vancomycin  750 mg Intravenous Q12H    Infusions: . milrinone 0.375 mcg/kg/min (08/24/16 0347)    PRN Medications: sodium chloride, acetaminophen, albuterol, baclofen, ondansetron (ZOFRAN) IV, senna, sodium chloride flush, sodium chloride flush, zolpidem   Assessment/Plan   1. Chronic Systolic HF with severe biventricular dysfunction EF 15% due to NICM --> cardiogenic shock  --Has medtronic ICD.  -NYHA III-IIIb symptoms on milrinone 0.375 mcg/kg/min.  Coox depressed at 41%. Will repeat stat.   - Volume status remains elevated. CVP 15 - Continue lasix 80 mg IV BID and agree with adding 5 mg metolazone BID. - Supp K    - Repeat Echo stat to look at RV.  - Continue losartan 12.5 mg BID. Cannot switch to [facility] now with soft BP - Continue imdur 30 mg.  - Continue digoxin - dig level 0.8 03/20/2016  - Continue spiro 25 mg  daily.  - No BB for now with ongoing low output (was not on at home but was started on admit. Will stop) - Cardiac rehab following.   - Now undergoing transplant work-up at [facility].Blood type O+. Transplant listing put on hold due to ongoing tobacco use and + urine tox screen with cocaine (which he denies)  2. AKI on CKD III - Mildly elevated overnight. Continue to follow closely with diuresis.  3. CAD - No CP.  --severe 3v- CAD s/p CABG with occluded grafts as above  except for LIMA. No targets for revascularization. No CP.  --Continue ASA, statin  4. HTN - Runs soft chronically.  Follow closely.    5. DMII - Cover with SSI in house.  6. RLE cellulitis - Covering with Vancomycin for now with poor po tolerance.  7. Tobacco abuse - States he has stopped smoking.   - Nicotine and Cotinine were consistent with tobacco or tobacco cessation product use.   Prognosis guarded.  Suspect that, unfortunately, Mr Fulk may be running out of options.   Length of Stay: 2  [name] Friar PA-C 08/24/2016, 7:48 AM  Advanced Heart Failure Team Pager [phone] (M-F; 7a - 4p)  Please contact [facility] for night-coverage after hours (4p -7a ) and weekends on [email or web address]  Patient seen and examined with Oda Kilts, PA-C. We discussed all aspects of the encounter. I agree with the assessment and plan as stated above.   CVP remains elevated and co-ox very low c/w cardiogenic shock. Echo done STAT at bedside and showed LVEF 15% with moderate RV dysfunction. Will increase milrinone to 0.5 mcg/kg/min.   He is undergoing transplant w/u at [facility] but recently put on hold due to tobacco use. Previously not felt to be VAD candidate due to RV failure but now RV may be able to handle. Will plan RHC tomorrow to further assess with eye toward possible VAD (HM-3 as BTT) this admit. I have discussed with VAD team. Can add norepi as needed to support shock. Continue diuresis. Watch renal function closely.    The patient is critically ill with multiple organ systems failure and requires high complexity decision making for assessment and support, frequent evaluation and titration of therapies, application of advanced monitoring technologies and extensive interpretation of multiple databases.   Critical Care Time devoted to patient care services described in this note is 35 Minutes.  Emmerie Battaglia,MD 1:01 PM

## 2016-08-24 NOTE — Progress Notes (Signed)
  Echocardiogram 2D Echocardiogram with Definity has been performed.  Ronald Miller M 08/24/2016, 9:00 AM

## 2016-08-24 NOTE — Progress Notes (Signed)
CARDIAC REHAB PHASE I   PRE:  Rate/Rhythm: 76 pacing    BP: sitting 81/48    SaO2: 80 RA asleep, 89-90 RA  MODE:  Ambulation: 240 ft   POST:  Rate/Rhythm: 101 pacing    BP: sitting 99/70     SaO2: 93 RA   Pt asleep, SaO2 dropping to 80 RA, then awoke. Pt flat, frustrated, but willing to walk. Slightly unsteady at times, sts he always feels bad. BP increased with walking. To recliner.  [place], ACSM 08/24/2016 11:40 AM

## 2016-08-24 NOTE — Progress Notes (Signed)
CSW referred to discuss LVAD evaluation. CSW and HF Navigator met with patient at bedside to briefly discuss LVAD and preliminary assessment. Patient spoke at length about his illness and the events leading to current hospitalization. CSW discussed role of caregiver and ongoing assistance needed for LVAD implant. CSW will return to complete full psychosocial assessment for LVAD. Raquel Sarna, LCSW [phone]

## 2016-08-25 ENCOUNTER — Encounter (HOSPITAL_COMMUNITY): Payer: Self-pay | Admitting: Internal Medicine

## 2016-08-25 ENCOUNTER — Ambulatory Visit (HOSPITAL_COMMUNITY): Admission: RE | Admit: 2016-08-25 | Payer: Medicare HMO | Source: Ambulatory Visit | Admitting: Internal Medicine

## 2016-08-25 ENCOUNTER — Encounter (HOSPITAL_COMMUNITY)
Admission: EM | Disposition: A | Discharge status: Home Health | Payer: Self-pay | Source: Home / Self Care | Attending: Internal Medicine

## 2016-08-25 ENCOUNTER — Inpatient Hospital Stay (HOSPITAL_COMMUNITY): Payer: Medicare HMO

## 2016-08-25 HISTORY — PX: CARDIAC CATHETERIZATION: SHX172

## 2016-08-25 LAB — POCT I-STAT 3, VENOUS BLOOD GAS (G3P V)
ACID-BASE EXCESS: 6 mmol/L — AB (ref 0.0–2.0)
ACID-BASE EXCESS: 7 mmol/L — AB (ref 0.0–2.0)
BICARBONATE: 33.1 mmol/L — AB (ref 20.0–28.0)
BICARBONATE: 33.7 mmol/L — AB (ref 20.0–28.0)
O2 SAT: 57 %
O2 Saturation: 55 %
PCO2 VEN: 57.7 mmHg (ref 44.0–60.0)
PH VEN: 7.363 (ref 7.250–7.430)
PO2 VEN: 31 mmHg — AB (ref 32.0–45.0)
PO2 VEN: 32 mmHg (ref 32.0–45.0)
TCO2: 35 mmol/L (ref 0–100)
TCO2: 35 mmol/L (ref 0–100)
pCO2, Ven: 59.3 mmHg (ref 44.0–60.0)
pH, Ven: 7.366 (ref 7.250–7.430)

## 2016-08-25 LAB — VANCOMYCIN, TROUGH: VANCOMYCIN TR: 17 ug/mL (ref 15–20)

## 2016-08-25 LAB — GLUCOSE, CAPILLARY
GLUCOSE-CAPILLARY: 108 mg/dL — AB (ref 65–99)
GLUCOSE-CAPILLARY: 230 mg/dL — AB (ref 65–99)
Glucose-Capillary: 90 mg/dL (ref 65–99)
Glucose-Capillary: 201 mg/dL — ABNORMAL HIGH (ref 65–99)

## 2016-08-25 LAB — CBC
HCT: 33 % — ABNORMAL LOW (ref 39.0–52.0)
HEMOGLOBIN: 10.9 g/dL — AB (ref 13.0–17.0)
MCH: 31.1 pg (ref 26.0–34.0)
MCHC: 33 g/dL (ref 30.0–36.0)
MCV: 94.3 fL (ref 78.0–100.0)
PLATELETS: 71 10*3/uL — AB (ref 150–400)
RBC: 3.5 MIL/uL — ABNORMAL LOW (ref 4.22–5.81)
RDW: 14.7 % (ref 11.5–15.5)
WBC: 7.2 10*3/uL (ref 4.0–10.5)

## 2016-08-25 LAB — COOXEMETRY PANEL
CARBOXYHEMOGLOBIN: 1.2 % (ref 0.5–1.5)
METHEMOGLOBIN: 1 % (ref 0.0–1.5)
O2 SAT: 51.7 %
Total hemoglobin: 11.2 g/dL — ABNORMAL LOW (ref 12.0–16.0)

## 2016-08-25 LAB — BASIC METABOLIC PANEL
ANION GAP: 8 (ref 5–15)
BUN: 31 mg/dL — ABNORMAL HIGH (ref 6–20)
CALCIUM: 8.8 mg/dL — AB (ref 8.9–10.3)
CO2: 31 mmol/L (ref 22–32)
CREATININE: 1.46 mg/dL — AB (ref 0.61–1.24)
Chloride: 95 mmol/L — ABNORMAL LOW (ref 101–111)
GFR, EST AFRICAN AMERICAN: 59 mL/min — AB (ref >60)
GFR, EST NON AFRICAN AMERICAN: 51 mL/min — AB (ref >60)
GLUCOSE: 189 mg/dL — AB (ref 65–99)
Potassium: 3.7 mmol/L (ref 3.5–5.1)
Sodium: 134 mmol/L — ABNORMAL LOW (ref 135–145)

## 2016-08-25 LAB — ANTITHROMBIN III: AntiThromb III Func: 90 % (ref 75–120)

## 2016-08-25 LAB — MAGNESIUM: MAGNESIUM: 2.2 mg/dL (ref 1.7–2.4)

## 2016-08-25 LAB — NICOTINE/COTININE METABOLITES
Cotinine: 2.2 ng/mL
Nicotine: NOT DETECTED ng/mL

## 2016-08-25 LAB — LACTATE DEHYDROGENASE: LDH: 172 U/L (ref 98–192)

## 2016-08-25 SURGERY — RIGHT HEART CATH

## 2016-08-25 MED ORDER — SODIUM CHLORIDE 0.9% FLUSH: 3.0000 mL | INTRAVENOUS | Status: DC | PRN

## 2016-08-25 MED ORDER — LIDOCAINE HCL (PF) 1 % IJ SOLN
INTRAMUSCULAR | Status: AC
  Filled 2016-08-25: qty 30

## 2016-08-25 MED ORDER — TRAMADOL HCL 50 MG PO TABS
100.0000 mg | ORAL_TABLET | Freq: Four times a day (QID) | ORAL | Status: DC | PRN
  Administered 2016-08-25 – 2016-09-05 (×5): 100 mg via ORAL
  Filled 2016-08-25 (×5): qty 2

## 2016-08-25 MED ORDER — ACETAMINOPHEN 325 MG PO TABS
650.0000 mg | ORAL_TABLET | ORAL | Status: DC | PRN
  Administered 2016-08-26 – 2016-09-02 (×3): 650 mg via ORAL
  Filled 2016-08-25 (×3): qty 2

## 2016-08-25 MED ORDER — TECHNETIUM TO 99M ALBUMIN AGGREGATED
4.0000 | Freq: Once | INTRAVENOUS | Status: AC | PRN
  Administered 2016-08-25: 4 via INTRAVENOUS

## 2016-08-25 MED ORDER — HEPARIN SODIUM (PORCINE) 5000 UNIT/ML IJ SOLN
5000.0000 [IU] | Freq: Three times a day (TID) | INTRAMUSCULAR | Status: DC
  Administered 2016-08-25 – 2016-09-03 (×26): 5000 [IU] via SUBCUTANEOUS
  Filled 2016-08-25 (×25): qty 1

## 2016-08-25 MED ORDER — SODIUM CHLORIDE 0.9 % IV SOLN: 250.0000 mL | INTRAVENOUS | Status: DC | PRN

## 2016-08-25 MED ORDER — POTASSIUM CHLORIDE CRYS ER 20 MEQ PO TBCR
20.0000 meq | EXTENDED_RELEASE_TABLET | Freq: Once | ORAL | Status: DC
  Filled 2016-08-25: qty 1

## 2016-08-25 MED ORDER — MIDAZOLAM HCL 2 MG/2ML IJ SOLN
INTRAMUSCULAR | Status: AC
  Filled 2016-08-25: qty 2

## 2016-08-25 MED ORDER — LIDOCAINE HCL (PF) 1 % IJ SOLN
INTRAMUSCULAR | Status: DC | PRN
Start: 2016-08-25 — End: 2016-08-25
  Administered 2016-08-25: 2 mL via INTRADERMAL

## 2016-08-25 MED ORDER — HYDROCODONE-ACETAMINOPHEN 5-325 MG PO TABS
1.0000 | ORAL_TABLET | Freq: Once | ORAL | Status: AC
  Administered 2016-08-25: 1 via ORAL
  Filled 2016-08-25: qty 1

## 2016-08-25 MED ORDER — SODIUM CHLORIDE 0.9% FLUSH
3.0000 mL | Freq: Two times a day (BID) | INTRAVENOUS | Status: DC
  Administered 2016-08-25: 10 mL via INTRAVENOUS
  Administered 2016-08-26: 3 mL via INTRAVENOUS
  Administered 2016-08-27: 10 mL via INTRAVENOUS
  Administered 2016-08-27: 3 mL via INTRAVENOUS

## 2016-08-25 MED ORDER — ONDANSETRON HCL 4 MG/2ML IJ SOLN
4.0000 mg | Freq: Four times a day (QID) | INTRAMUSCULAR | Status: DC | PRN
  Administered 2016-08-26: 4 mg via INTRAVENOUS
  Filled 2016-08-25 (×2): qty 2

## 2016-08-25 MED ORDER — FENTANYL CITRATE (PF) 100 MCG/2ML IJ SOLN
INTRAMUSCULAR | Status: DC | PRN
  Administered 2016-08-25 (×2): 25 ug via INTRAVENOUS

## 2016-08-25 MED ORDER — FUROSEMIDE 10 MG/ML IJ SOLN
80.0000 mg | Freq: Two times a day (BID) | INTRAMUSCULAR | Status: DC
  Administered 2016-08-25 – 2016-08-27 (×4): 80 mg via INTRAVENOUS
  Filled 2016-08-25 (×4): qty 8

## 2016-08-25 MED ORDER — SODIUM CHLORIDE 0.9% FLUSH
3.0000 mL | Freq: Two times a day (BID) | INTRAVENOUS | Status: DC
  Administered 2016-08-25: 10 mL via INTRAVENOUS

## 2016-08-25 MED ORDER — SODIUM CHLORIDE 0.9 % IV SOLN
250.0000 mL | INTRAVENOUS | Status: DC | PRN
  Administered 2016-08-26 – 2016-08-27 (×2): 250 mL via INTRAVENOUS

## 2016-08-25 MED ORDER — FUROSEMIDE 10 MG/ML IJ SOLN: 80.0000 mg | Freq: Once | INTRAMUSCULAR | Status: DC

## 2016-08-25 MED ORDER — FENTANYL CITRATE (PF) 100 MCG/2ML IJ SOLN
INTRAMUSCULAR | Status: AC
  Filled 2016-08-25: qty 2

## 2016-08-25 MED ORDER — HEPARIN (PORCINE) IN NACL 2-0.9 UNIT/ML-% IJ SOLN
INTRAMUSCULAR | Status: AC
  Filled 2016-08-25: qty 1000

## 2016-08-25 MED ORDER — TECHNETIUM TC 99M DIETHYLENETRIAME-PENTAACETIC ACID: 4.2000 | Freq: Once | INTRAVENOUS | Status: DC | PRN

## 2016-08-25 MED ORDER — MIDAZOLAM HCL 2 MG/2ML IJ SOLN
INTRAMUSCULAR | Status: DC | PRN
Start: 2016-08-25 — End: 2016-08-25
  Administered 2016-08-25 (×2): 1 mg via INTRAVENOUS

## 2016-08-25 SURGICAL SUPPLY — 9 items
CATH SWAN GANZ 7F STRAIGHT (CATHETERS) ×3 IMPLANT
KIT HEART LEFT (KITS) ×3 IMPLANT
KIT HEART RIGHT NAMIC (KITS) ×3 IMPLANT
PACK CARDIAC CATHETERIZATION (CUSTOM PROCEDURE TRAY) ×3 IMPLANT
SHEATH PINNACLE 7F 10CM (SHEATH) ×3 IMPLANT
SLEEVE REPOSITIONING LENGTH 30 (MISCELLANEOUS) ×3 IMPLANT
SYR MEDRAD MARK V 150ML (SYRINGE) ×3 IMPLANT
TRANSDUCER W/STOPCOCK (MISCELLANEOUS) ×3 IMPLANT
TUBING CIL FLEX 10 FLL-RA (TUBING) ×3 IMPLANT

## 2016-08-25 NOTE — CV Procedure (Signed)
RHC Findings:  On milrinone 0.5 mcg/kg/min  RA = 19 RV = 55/19 PA = 52/23 (33) PCW = 26 Fick cardiac output/index = 5.1/2.3 Thermo CO/CI = 6.5/2.9 PVR = 1.0 WU Ao sat = 95% PA sat = 55%, 57% RA/PCWP = 0.73  Assessment: 1. Biventricular failure with persistently elevated pressures 2. Normal output with milrinone support   Plan/Discussion:  I remain concerned about his RV function RA/PCWP remains high despite high-dose milrinone. Will leave swan in and try to diurese gently and follow numbers. Continue to discuss with VAD team.   Sakiyah Shur,MD 9:28 AM

## 2016-08-25 NOTE — Interval H&P Note (Signed)
History and Physical Interval Note:  08/25/2016 8:55 AM  Gunnar Fusi.  has presented today for surgery, with the diagnosis of hf  The various methods of treatment have been discussed with the patient and family. After consideration of risks, benefits and other options for treatment, the patient has consented to  Procedure(s): Right Heart Cath (N/A) as a surgical intervention .  The patient's history has been reviewed, patient examined, no change in status, stable for surgery.  I have reviewed the patient's chart and labs.  Questions were answered to the patient's satisfaction.     Malkia Nippert, Quillian Quince

## 2016-08-25 NOTE — Progress Notes (Signed)
Advanced Heart Failure Rounding Note  PCP: [facility] Primary Cardiologist: Dr. Haroldine Laws   Subjective:    Ronald Miller. is a 60 y.o. male with history of chronic systolic HF s/p Medtronic ICD 2014, CAD s/p CABG in 2000, HTN, Hx of cocaine abuse, Tobacco abuse, Depression, and COPD.   Has been on milrinone at home since 03/2016. Admitted with 12 lb weight gain over weekend and not responding to adjustment of diuretics.  Last Echo 8/17: LVEF 20% RV moderate to severe HK.  Milrinone increased from 0.25 -> 0.375 at end of September with Coox 50%.  Milrinone increased to 0.375 -> 0.5 08/22/16  Remains depressed over his medical problems. Worried that he wont be able to "go through" surgery.  CVP 18  Coox 51.7% on milrinone 0.5 mcg/kg/min. Creatinine 1.46. Lasix held 08/24/16 with hypotension and dizziness. Weight up 3 lbs.   Objective:   Weight Range: 234 lb 14.4 oz (106.5 kg) Body mass index is 34.69 kg/m.   Vital Signs:   Temp:  [97.3 F (36.3 C)-97.9 F (36.6 C)] 97.5 F (36.4 C) (11/22 0328) Pulse Rate:  [59-85] 71 (11/22 0328) Resp:  [12-25] 20 (11/22 0328) BP: (80-111)/(59-74) 111/74 (11/22 0328) SpO2:  [89 %-95 %] 91 % (11/22 0328) Weight:  [234 lb 14.4 oz (106.5 kg)] 234 lb 14.4 oz (106.5 kg) (11/22 0500) Last BM Date: 08/22/16  Weight change: Filed Weights   08/23/16 0500 08/24/16 0323 08/25/16 0500  Weight: 230 lb 13.2 oz (104.7 kg) 231 lb 12.8 oz (105.1 kg) 234 lb 14.4 oz (106.5 kg)    Intake/Output:   Intake/Output Summary (Last 24 hours) at 08/25/16 0714 Last data filed at 08/25/16 0500  Gross per 24 hour  Intake           1093.3 ml  Output             2250 ml  Net          -1156.7 ml     Physical Exam: CVP 18 General:  Fatigued appearing.  HEENT: Normal Neck: supple. JVP to jaw +. Carotids 2+ bilat; no bruits. No thyromegaly or nodule noted.  Cor: PMI laterally displaced. RRR. No M/G/R.  Lungs: Decreased. Abdomen: soft,  NT, mild/mod distended, no HSM. No bruits or masses. +BS  Extremities: no cyanosis, clubbing, rash, Trace to 1+ BLE edema. R shin with open sore and surrounding erythema Neuro: alert & orientedx3, cranial nerves grossly intact. moves all 4 extremities w/o difficulty. Affect pleasant  Telemetry: Reviewed, NSR  Labs: CBC  Recent Labs  08/22/16 1514 08/25/16 0340  WBC 7.8 7.2  HGB 12.4* 10.9*  HCT 36.8* 33.0*  MCV 93.2 94.3  PLT 107* 71*   Basic Metabolic Panel  Recent Labs  08/24/16 0339 08/25/16 0340  NA 133* 134*  K 3.4* 3.7  CL 91* 95*  CO2 31 31  GLUCOSE 241* 189*  BUN 30* 31*  CREATININE 1.47* 1.46*  CALCIUM 8.7* 8.8*  MG 1.7  --    Liver Function Tests No results for input(s): AST, ALT, ALKPHOS, BILITOT, PROT, ALBUMIN in the last 72 hours. No results for input(s): LIPASE, AMYLASE in the last 72 hours. Cardiac Enzymes No results for input(s): CKTOTAL, CKMB, CKMBINDEX, TROPONINI in the last 72 hours.  BNP: BNP (last 3 results)  Recent Labs  03/10/16 0930 03/19/16 1230 08/22/16 1514  BNP 1,369.8* 1,079.6* 1,208.4*    ProBNP (last 3 results) No results for input(s): PROBNP in the  last 8760 hours.   D-Dimer No results for input(s): DDIMER in the last 72 hours. Hemoglobin A1C No results for input(s): HGBA1C in the last 72 hours. Fasting Lipid Panel No results for input(s): CHOL, HDL, LDLCALC, TRIG, CHOLHDL, LDLDIRECT in the last 72 hours. Thyroid Function Tests No results for input(s): TSH, T4TOTAL, T3FREE, THYROIDAB in the last 72 hours.  Invalid input(s): FREET3  Other results:     Imaging/Studies:  No results found.  Latest Echo  Latest Cath   Medications:     Scheduled Medications: . aspirin EC  81 mg Oral Daily  . atorvastatin  20 mg Oral QHS  . digoxin  250 mcg Oral Daily  . escitalopram  20 mg Oral Daily  . gabapentin  100 mg Oral BID  . heparin  5,000 Units Subcutaneous Q8H  . insulin aspart  0-15 Units Subcutaneous TID  WC  . insulin aspart  0-5 Units Subcutaneous QHS  . insulin aspart  5 Units Subcutaneous TID WC  . insulin glargine  40 Units Subcutaneous QHS  . isosorbide mononitrate  30 mg Oral Daily  . losartan  12.5 mg Oral BID  . multivitamin with minerals  1 tablet Oral Daily  . nicotine  21 mg Transdermal Q24H  . sodium chloride flush  3 mL Intravenous Q12H  . sodium chloride flush  3 mL Intravenous Q12H  . spironolactone  25 mg Oral Daily  . vancomycin  750 mg Intravenous Q12H    Infusions: . sodium chloride 10 mL/hr at 08/25/16 0000  . milrinone 0.5 mcg/kg/min (08/24/16 1844)    PRN Medications: sodium chloride, sodium chloride, acetaminophen, albuterol, baclofen, ondansetron (ZOFRAN) IV, senna, sodium chloride flush, sodium chloride flush, sodium chloride flush, zolpidem   Assessment/Plan   1. Chronic Systolic HF with severe biventricular dysfunction EF 15% due to NICM --> cardiogenic shock  --Has medtronic ICD.  - Echo 08/24/16 LVEF 15%, RV mild dilated, moderately reduced.  -NYHA III-IIIb symptoms on milrinone 0.5 mcg/kg/min.  Coox marginal at 51%.    - Volume status remains elevated. CVP 18 - Hold lasix until after cath. - Continue losartan 12.5 mg BID. Cannot switch to [facility] now with soft BP - Continue imdur 30 mg.  - Continue digoxin - dig level 0.8 03/20/2016  - Continue spiro 25 mg daily.  - No BB for now with ongoing low output - Cardiac rehab following.   - Now undergoing transplant work-up at [facility].Blood type O+. Transplant listing put on hold due to ongoing tobacco use and + urine tox screen with cocaine (which he denies)  2. AKI on CKD III - Elevated but stable.  - Continue to follow closely.   3. CAD - No CP.  --severe 3v- CAD s/p CABG with occluded grafts as above except for LIMA. No targets for revascularization. No CP.  --Continue ASA, statin  4. HTN - Runs soft,  80-90s chronically.   5. DMII - Cover with SSI in house.  6. RLE cellulitis - Covering  with Vancomycin for now with poor po tolerance.  7. Tobacco abuse - States he has stopped smoking about a week ago.  - Nicotine and Cotinine were consistent with tobacco or tobacco cessation product use 08/20/16.  RHC this am in attempt to work towards LVAD if patient is agreeable.   Length of Stay: 3  [name] Friar PA-C 08/25/2016, 7:14 AM  Advanced Heart Failure Team Pager [phone] (M-F; 7a - 4p)  Please contact [facility] for night-coverage after hours (4p -  7a ) and weekends on [email or web address]  Patient seen and examined with Oda Kilts, PA-C. We discussed all aspects of the encounter. I agree with the assessment and plan as stated above.   He remains very tenuous despite increasing milrinone with low co-ox and high CVP. I have discussed the case with Dr. Hoyt Koch at [facility] transplant program and he confirms that he is not transplant candidate in the next 6 months until he is free from tobacco and undergoes substance counselling. He agrees with VAD as BTT if he is deteriorating. I have also spoken to Dr. PVT and concerned about degree of RHF and cirrhosis as well as dentition. We will proceed with RHC today to see if numbers favorable for VAD.   The patient is critically ill with multiple organ systems failure and requires high complexity decision making for assessment and support, frequent evaluation and titration of therapies, application of advanced monitoring technologies and extensive interpretation of multiple databases.   Critical Care Time devoted to patient care services described in this note is 35 Minutes.  Jasara Corrigan,MD 8:54 AM

## 2016-08-25 NOTE — Progress Notes (Addendum)
Pharmacy Antibiotic Note  Ronald Miller. is a 60 y.o. male admitted on 08/22/2016 with acute on chronic HF exacerbation and started on IV vancomycin for RLE cellulitis given significant N/V on admit and PMH of DM and MRSA. Renal function has remained relatively stable ~65 ml/min, WBC wnl, and pt is afebrile. Vancomycin trough this morning was 17, however the evening dose was given an hour late and the trough was taken thirty minutes early, so true trough likely closer to 15.5. Although this is slightly above goal trough of 10-15, will continue current regimen for now and watch renal function closely.   Plan: -Continue vancomycin 750mg  IV q12h -Monitor renal function closely -Follow-up length of therapy  Height: 5\' 9"  (175.3 cm) Weight: 234 lb 14.4 oz (106.5 kg) IBW/kg (Calculated) : 70.7  Temp (24hrs), Avg:97.7 F (36.5 C), Min:97.3 F (36.3 C), Max:98.5 F (36.9 C)   Recent Labs Lab 08/20/16 1021 08/22/16 1514 08/23/16 0500 08/24/16 0339 08/25/16 0340 08/25/16 0700  WBC  --  7.8  --   --  7.2  --   CREATININE 1.24 1.23 1.51* 1.47* 1.46*  --   VANCOTROUGH  --   --   --   --   --  17    Estimated Creatinine Clearance: 65.6 mL/min (by C-G formula based on SCr of 1.46 mg/dL (H)).    Allergies  Allergen Reactions  . Codeine Nausea And Vomiting  . Lipitor [Atorvastatin] Nausea Only    Nausea with high doses, tolerates 20mg  dose (08/22/16)    Antimicrobials this admission: 11/19 vancomycin >  Dose adjustments this admission: 11/22 Vancomycin Trough: 17 (true trough closer to 15.5)  Microbiology results: 11/19 mrsa pcr: neg  Thank you for allowing pharmacy to be a part of this patient's care.  Arrie Senate, PharmD PGY-1 Pharmacy Resident Pager: [phone] 08/25/2016

## 2016-08-25 NOTE — H&P (View-Only) (Signed)
Advanced Heart Failure Rounding Note  PCP: [facility] Primary Cardiologist: Dr. Haroldine Laws   Subjective:    Ronald Miller. is a 60 y.o. male with history of chronic systolic HF s/p Medtronic ICD 2014, CAD s/p CABG in 2000, HTN, Hx of cocaine abuse, Tobacco abuse, Depression, and COPD.   Has been on milrinone at home since 03/2016. Admitted with 12 lb weight gain over weekend and not responding to adjustment of diuretics.  Last Echo 8/17: LVEF 20% RV moderate to severe HK.  Milrinone increased from 0.25 -> 0.375 at end of September with Coox 50%.  Milrinone increased to 0.375 -> 0.5 08/22/16  Remains depressed over his medical problems. Worried that he wont be able to "go through" surgery.  CVP 18  Coox 51.7% on milrinone 0.5 mcg/kg/min. Creatinine 1.46. Lasix held 08/24/16 with hypotension and dizziness. Weight up 3 lbs.   Objective:   Weight Range: 234 lb 14.4 oz (106.5 kg) Body mass index is 34.69 kg/m.   Vital Signs:   Temp:  [97.3 F (36.3 C)-97.9 F (36.6 C)] 97.5 F (36.4 C) (11/22 0328) Pulse Rate:  [59-85] 71 (11/22 0328) Resp:  [12-25] 20 (11/22 0328) BP: (80-111)/(59-74) 111/74 (11/22 0328) SpO2:  [89 %-95 %] 91 % (11/22 0328) Weight:  [234 lb 14.4 oz (106.5 kg)] 234 lb 14.4 oz (106.5 kg) (11/22 0500) Last BM Date: 08/22/16  Weight change: Filed Weights   08/23/16 0500 08/24/16 0323 08/25/16 0500  Weight: 230 lb 13.2 oz (104.7 kg) 231 lb 12.8 oz (105.1 kg) 234 lb 14.4 oz (106.5 kg)    Intake/Output:   Intake/Output Summary (Last 24 hours) at 08/25/16 0714 Last data filed at 08/25/16 0500  Gross per 24 hour  Intake           1093.3 ml  Output             2250 ml  Net          -1156.7 ml     Physical Exam: CVP 18 General:  Fatigued appearing.  HEENT: Normal Neck: supple. JVP to jaw +. Carotids 2+ bilat; no bruits. No thyromegaly or nodule noted.  Cor: PMI laterally displaced. RRR. No M/G/R.  Lungs: Decreased. Abdomen: soft,  NT, mild/mod distended, no HSM. No bruits or masses. +BS  Extremities: no cyanosis, clubbing, rash, Trace to 1+ BLE edema. R shin with open sore and surrounding erythema Neuro: alert & orientedx3, cranial nerves grossly intact. moves all 4 extremities w/o difficulty. Affect pleasant  Telemetry: Reviewed, NSR  Labs: CBC  Recent Labs  08/22/16 1514 08/25/16 0340  WBC 7.8 7.2  HGB 12.4* 10.9*  HCT 36.8* 33.0*  MCV 93.2 94.3  PLT 107* 71*   Basic Metabolic Panel  Recent Labs  08/24/16 0339 08/25/16 0340  NA 133* 134*  K 3.4* 3.7  CL 91* 95*  CO2 31 31  GLUCOSE 241* 189*  BUN 30* 31*  CREATININE 1.47* 1.46*  CALCIUM 8.7* 8.8*  MG 1.7  --    Liver Function Tests No results for input(s): AST, ALT, ALKPHOS, BILITOT, PROT, ALBUMIN in the last 72 hours. No results for input(s): LIPASE, AMYLASE in the last 72 hours. Cardiac Enzymes No results for input(s): CKTOTAL, CKMB, CKMBINDEX, TROPONINI in the last 72 hours.  BNP: BNP (last 3 results)  Recent Labs  03/10/16 0930 03/19/16 1230 08/22/16 1514  BNP 1,369.8* 1,079.6* 1,208.4*    ProBNP (last 3 results) No results for input(s): PROBNP in the  last 8760 hours.   D-Dimer No results for input(s): DDIMER in the last 72 hours. Hemoglobin A1C No results for input(s): HGBA1C in the last 72 hours. Fasting Lipid Panel No results for input(s): CHOL, HDL, LDLCALC, TRIG, CHOLHDL, LDLDIRECT in the last 72 hours. Thyroid Function Tests No results for input(s): TSH, T4TOTAL, T3FREE, THYROIDAB in the last 72 hours.  Invalid input(s): FREET3  Other results:     Imaging/Studies:  No results found.  Latest Echo  Latest Cath   Medications:     Scheduled Medications: . aspirin EC  81 mg Oral Daily  . atorvastatin  20 mg Oral QHS  . digoxin  250 mcg Oral Daily  . escitalopram  20 mg Oral Daily  . gabapentin  100 mg Oral BID  . heparin  5,000 Units Subcutaneous Q8H  . insulin aspart  0-15 Units Subcutaneous TID  WC  . insulin aspart  0-5 Units Subcutaneous QHS  . insulin aspart  5 Units Subcutaneous TID WC  . insulin glargine  40 Units Subcutaneous QHS  . isosorbide mononitrate  30 mg Oral Daily  . losartan  12.5 mg Oral BID  . multivitamin with minerals  1 tablet Oral Daily  . nicotine  21 mg Transdermal Q24H  . sodium chloride flush  3 mL Intravenous Q12H  . sodium chloride flush  3 mL Intravenous Q12H  . spironolactone  25 mg Oral Daily  . vancomycin  750 mg Intravenous Q12H    Infusions: . sodium chloride 10 mL/hr at 08/25/16 0000  . milrinone 0.5 mcg/kg/min (08/24/16 1844)    PRN Medications: sodium chloride, sodium chloride, acetaminophen, albuterol, baclofen, ondansetron (ZOFRAN) IV, senna, sodium chloride flush, sodium chloride flush, sodium chloride flush, zolpidem   Assessment/Plan   1. Chronic Systolic HF with severe biventricular dysfunction EF 15% due to NICM --> cardiogenic shock  --Has medtronic ICD.  - Echo 08/24/16 LVEF 15%, RV mild dilated, moderately reduced.  -NYHA III-IIIb symptoms on milrinone 0.5 mcg/kg/min.  Coox marginal at 51%.    - Volume status remains elevated. CVP 18 - Hold lasix until after cath. - Continue losartan 12.5 mg BID. Cannot switch to [facility] now with soft BP - Continue imdur 30 mg.  - Continue digoxin - dig level 0.8 03/20/2016  - Continue spiro 25 mg daily.  - No BB for now with ongoing low output - Cardiac rehab following.   - Now undergoing transplant work-up at [facility].Blood type O+. Transplant listing put on hold due to ongoing tobacco use and + urine tox screen with cocaine (which he denies)  2. AKI on CKD III - Elevated but stable.  - Continue to follow closely.   3. CAD - No CP.  --severe 3v- CAD s/p CABG with occluded grafts as above except for LIMA. No targets for revascularization. No CP.  --Continue ASA, statin  4. HTN - Runs soft,  80-90s chronically.   5. DMII - Cover with SSI in house.  6. RLE cellulitis - Covering  with Vancomycin for now with poor po tolerance.  7. Tobacco abuse - States he has stopped smoking about a week ago.  - Nicotine and Cotinine were consistent with tobacco or tobacco cessation product use 08/20/16.  RHC this am in attempt to work towards LVAD if patient is agreeable.   Length of Stay: 3  [name] Friar PA-C 08/25/2016, 7:14 AM  Advanced Heart Failure Team Pager [phone] (M-F; 7a - 4p)  Please contact [facility] for night-coverage after hours (4p -  7a ) and weekends on [email or web address]  Patient seen and examined with Oda Kilts, PA-C. We discussed all aspects of the encounter. I agree with the assessment and plan as stated above.   He remains very tenuous despite increasing milrinone with low co-ox and high CVP. I have discussed the case with Dr. Hoyt Koch at [facility] transplant program and he confirms that he is not transplant candidate in the next 6 months until he is free from tobacco and undergoes substance counselling. He agrees with VAD as BTT if he is deteriorating. I have also spoken to Dr. PVT and concerned about degree of RHF and cirrhosis as well as dentition. We will proceed with RHC today to see if numbers favorable for VAD.   The patient is critically ill with multiple organ systems failure and requires high complexity decision making for assessment and support, frequent evaluation and titration of therapies, application of advanced monitoring technologies and extensive interpretation of multiple databases.   Critical Care Time devoted to patient care services described in this note is 35 Minutes.  [place],MD 8:54 AM

## 2016-08-26 LAB — BASIC METABOLIC PANEL
Anion gap: 7 (ref 5–15)
BUN: 22 mg/dL — ABNORMAL HIGH (ref 6–20)
CHLORIDE: 95 mmol/L — AB (ref 101–111)
CO2: 32 mmol/L (ref 22–32)
CREATININE: 1.25 mg/dL — AB (ref 0.61–1.24)
Calcium: 8.7 mg/dL — ABNORMAL LOW (ref 8.9–10.3)
GFR calc non Af Amer: >60 mL/min (ref >60)
Glucose, Bld: 274 mg/dL — ABNORMAL HIGH (ref 65–99)
POTASSIUM: 3.2 mmol/L — AB (ref 3.5–5.1)
SODIUM: 134 mmol/L — AB (ref 135–145)

## 2016-08-26 LAB — GLUCOSE, CAPILLARY
Glucose-Capillary: 235 mg/dL — ABNORMAL HIGH (ref 65–99)
Glucose-Capillary: 151 mg/dL — ABNORMAL HIGH (ref 65–99)
Glucose-Capillary: 185 mg/dL — ABNORMAL HIGH (ref 65–99)
Glucose-Capillary: 208 mg/dL — ABNORMAL HIGH (ref 65–99)

## 2016-08-26 LAB — CBC
HCT: 35 % — ABNORMAL LOW (ref 39.0–52.0)
Hemoglobin: 11.7 g/dL — ABNORMAL LOW (ref 13.0–17.0)
MCH: 31.3 pg (ref 26.0–34.0)
MCHC: 33.4 g/dL (ref 30.0–36.0)
MCV: 93.6 fL (ref 78.0–100.0)
PLATELETS: 130 10*3/uL — AB (ref 150–400)
RBC: 3.74 MIL/uL — ABNORMAL LOW (ref 4.22–5.81)
RDW: 13.6 % (ref 11.5–15.5)
WBC: 7 10*3/uL (ref 4.0–10.5)

## 2016-08-26 LAB — COOXEMETRY PANEL
Carboxyhemoglobin: 1.1 % (ref 0.5–1.5)
Methemoglobin: 0.9 % (ref 0.0–1.5)
O2 SAT: 49.4 %
Total hemoglobin: 11 g/dL — ABNORMAL LOW (ref 12.0–16.0)

## 2016-08-26 LAB — URINALYSIS, ROUTINE W REFLEX MICROSCOPIC
Bilirubin Urine: NEGATIVE
Glucose, UA: NEGATIVE mg/dL
Hgb urine dipstick: NEGATIVE
Ketones, ur: NEGATIVE mg/dL
Leukocytes, UA: NEGATIVE
Nitrite: NEGATIVE
Protein, ur: NEGATIVE mg/dL
Specific Gravity, Urine: 1.006 (ref 1.005–1.030)
pH: 6.5 (ref 5.0–8.0)

## 2016-08-26 LAB — TSH: TSH: 2.419 u[IU]/mL (ref 0.350–4.500)

## 2016-08-26 LAB — HIV ANTIBODY (ROUTINE TESTING W REFLEX): HIV SCREEN 4TH GENERATION: NONREACTIVE

## 2016-08-26 LAB — PREALBUMIN: PREALBUMIN: 11.4 mg/dL — AB (ref 18–38)

## 2016-08-26 MED ORDER — POTASSIUM CHLORIDE CRYS ER 20 MEQ PO TBCR
40.0000 meq | EXTENDED_RELEASE_TABLET | Freq: Two times a day (BID) | ORAL | Status: DC
  Administered 2016-08-26 – 2016-08-28 (×5): 40 meq via ORAL
  Filled 2016-08-26 (×5): qty 2

## 2016-08-26 NOTE — Procedures (Signed)
CVP not changed be it's due to be changed 11/26

## 2016-08-26 NOTE — Progress Notes (Signed)
Advanced Heart Failure Rounding Note  PCP: [facility] Primary Cardiologist: Dr. Haroldine Laws   Subjective:    Ronald Miller. is a 60 y.o. male with history of chronic systolic HF s/p Medtronic ICD 2014, CAD s/p CABG in 2000, HTN, Hx of cocaine abuse, Tobacco abuse, Depression, and COPD.   Has been on milrinone at home since 03/2016. Admitted with 12 lb weight gain over weekend and not responding to adjustment of diuretics.  Last Echo 8/17: LVEF 20% RV moderate to severe HK.  Admitted with recurrent HF.   RHC on 11/22 on milrinone 0.5 showed persistently elevated biventricular pressures. CI was ok. Lasix increased.   Feels fatigued. Belly is bloated. + SOB at rest  [place]wan numbers today (done personally) RA  14 PA  48/15 (24) PCWP 25 Thermo  26.8/3.0 Co-ox 49%  RHC 11/22 RA = 19 RV = 55/19 PA = 52/23 (33) PCW = 26 Fick cardiac output/index = 5.1/2.3 Thermo CO/CI = 6.5/2.9 PVR = 1.0 WU Ao sat = 95% PA sat = 55%, 57% RA/PCWP = 0.73  Objective:   Weight Range: 100.2 kg (221 lb) Body mass index is 32.64 kg/m.   Vital Signs:   Temp:  [97.2 F (36.2 C)-98.1 F (36.7 C)] 97.9 F (36.6 C) (11/23 1200) Resp:  [17-26] 19 (11/23 1200) BP: (83-129)/(51-90) 103/68 (11/23 1200) SpO2:  [90 %-91 %] 90 % (11/23 1200) Weight:  [100.2 kg (221 lb)] 100.2 kg (221 lb) (11/23 0500) Last BM Date: 08/26/16  Weight change: Filed Weights   08/24/16 0323 08/25/16 0500 08/26/16 0500  Weight: 105.1 kg (231 lb 12.8 oz) 106.5 kg (234 lb 14.4 oz) 100.2 kg (221 lb)    Intake/Output:   Intake/Output Summary (Last 24 hours) at 08/26/16 1529 Last data filed at 08/26/16 1324  Gross per 24 hour  Intake           1252.4 ml  Output             4170 ml  Net          -2917.6 ml     Physical Exam: General: Sitting in chair Fatigued appearing.  HEENT: Normal Neck: supple. RIJ swan  JVP to jaw +. Carotids 2+ bilat; no bruits. No thyromegaly or nodule noted.  Cor: PMI  laterally displaced. RRR. No M/G/R.  Lungs: Decreased. Abdomen: soft, NT, ++ distended, no HSM. No bruits or masses. +BS  Extremities: no cyanosis, clubbing, rash, Trace to 1+ BLE edema. R shin with open sore and surrounding erythema Neuro: alert & orientedx3, cranial nerves grossly intact. moves all 4 extremities w/o difficulty. Affect pleasant  Telemetry: Reviewed, NSR with frequent PVCs and V-pacing  Labs: CBC  Recent Labs  08/25/16 0340  WBC 7.2  HGB 10.9*  HCT 33.0*  MCV 94.3  PLT 71*   Basic Metabolic Panel  Recent Labs  08/24/16 0339 08/25/16 0340 08/25/16 0600 08/26/16 0515  NA 133* 134*  --  134*  K 3.4* 3.7  --  3.2*  CL 91* 95*  --  95*  CO2 31 31  --  32  GLUCOSE 241* 189*  --  274*  BUN 30* 31*  --  22*  CREATININE 1.47* 1.46*  --  1.25*  CALCIUM 8.7* 8.8*  --  8.7*  MG 1.7  --  2.2  --    Liver Function Tests No results for input(s): AST, ALT, ALKPHOS, BILITOT, PROT, ALBUMIN in the last 72 hours. No results for  input(s): LIPASE, AMYLASE in the last 72 hours. Cardiac Enzymes No results for input(s): CKTOTAL, CKMB, CKMBINDEX, TROPONINI in the last 72 hours.  BNP: BNP (last 3 results)  Recent Labs  03/10/16 0930 03/19/16 1230 08/22/16 1514  BNP 1,369.8* 1,079.6* 1,208.4*    ProBNP (last 3 results) No results for input(s): PROBNP in the last 8760 hours.   D-Dimer No results for input(s): DDIMER in the last 72 hours. Hemoglobin A1C No results for input(s): HGBA1C in the last 72 hours. Fasting Lipid Panel No results for input(s): CHOL, HDL, LDLCALC, TRIG, CHOLHDL, LDLDIRECT in the last 72 hours. Thyroid Function Tests  Recent Labs  08/26/16 0515  TSH 2.419    Other results:     Imaging/Studies:  Dg Orthopantogram  Result Date: 08/25/2016 CLINICAL DATA:  CHF, pre cardiac surgery evaluation, broken teeth EXAM: ORTHOPANTOGRAM/PANORAMIC COMPARISON:  None FINDINGS: Numerous dental caries identified, greatest at RIGHT mandibular  molars and LEFT maxillary molars. Mild periodontal lucency at the base of tooth #24. Subtle periodontal lucency at tooth #3. No additional periodontal changes identified. IMPRESSION: Extensive dental caries and periodontal lucencies at teeth #3 and #24. Electronically Signed   By: Lavonia Dana M.D.   On: 08/25/2016 15:56   Nm Pulmonary Perf And Vent  Result Date: 08/25/2016 CLINICAL DATA:  Congestive heart failure. EXAM: NUCLEAR MEDICINE VENTILATION - PERFUSION LUNG SCAN TECHNIQUE: Ventilation images were obtained in multiple projections using inhaled aerosol Tc-44m DTPA. Perfusion images were obtained in multiple projections after intravenous injection of Tc-51m MAA. RADIOPHARMACEUTICALS:  31.0 mCi Technetium-33m DTPA aerosol inhalation and 4 point Touhy mCi Technetium-74m MAA IV COMPARISON:  Two-view chest x-ray 08/22/2016. FINDINGS: Ventilation: There is some clumping of radiopharmaceutical at the hila bilaterally. The lungs otherwise and slight homogeneously. Perfusion: No significant perfusion defects are present. Cardiomegaly is stable. IMPRESSION: 1. Normal scan. 2. Cardiomegaly. Electronically Signed   By: San Morelle M.D.   On: 08/25/2016 15:44    Latest Echo  Latest Cath   Medications:     Scheduled Medications: . aspirin EC  81 mg Oral Daily  . atorvastatin  20 mg Oral QHS  . digoxin  250 mcg Oral Daily  . escitalopram  20 mg Oral Daily  . furosemide  80 mg Intravenous BID  . furosemide  80 mg Intravenous Once  . gabapentin  100 mg Oral BID  . heparin  5,000 Units Subcutaneous Q8H  . insulin aspart  0-15 Units Subcutaneous TID WC  . insulin aspart  0-5 Units Subcutaneous QHS  . insulin aspart  5 Units Subcutaneous TID WC  . insulin glargine  40 Units Subcutaneous QHS  . isosorbide mononitrate  30 mg Oral Daily  . losartan  12.5 mg Oral BID  . multivitamin with minerals  1 tablet Oral Daily  . nicotine  21 mg Transdermal Q24H  . potassium chloride  40 mEq Oral BID    . sodium chloride flush  3 mL Intravenous Q12H  . sodium chloride flush  3 mL Intravenous Q12H  . sodium chloride flush  3 mL Intravenous Q12H  . spironolactone  25 mg Oral Daily  . vancomycin  750 mg Intravenous Q12H    Infusions: . milrinone 0.5 mcg/kg/min (08/26/16 1308)    PRN Medications: sodium chloride, sodium chloride, sodium chloride, acetaminophen, albuterol, baclofen, ondansetron (ZOFRAN) IV, senna, sodium chloride flush, sodium chloride flush, sodium chloride flush, sodium chloride flush, technetium TC 28M diethylenetriame-pentaacetic acid, traMADol, zolpidem   Assessment/Plan   1. Acute on Chronic end-stage biventricular Systolic  HF LVEF 15% due to ICM --> cardiogenic shock - Echo 08/24/16 LVEF 15%, RV mild dilated, moderately reduced.  -NYHA IV symptoms on milrinone 0.5 mcg/kg/min.  - RHC on 11/22 as above - Volume status remains elevated. Will continue to diurese. Renal function improving - No BB for now with ongoing low output -Blood type O+. Transplant listing at [facility] on hold due to ongoing tobacco use and + urine tox screen with cocaine (which he denies)  2. AKI on CKD III - Improved with increased milrinone - Continue to follow closely.   3. CAD - No CP.  --severe 3v- CAD s/p CABG with occluded grafts as above except for LIMA. No targets for revascularization. No CP.  --Continue ASA, statin  4. DMII - Cover with SSI in house.  5. RLE cellulitis - Covering with Vancomycin for now with poor po tolerance.  6. Tobacco abuse - States he has stopped smoking about a week ago.  - Nicotine and Cotinine were consistent with tobacco or tobacco cessation product use 08/20/16. 7. Hypokalemia --will supp 8. 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 I think RV would likely tolerate VAD but I have discussed with Dr. Prescott Gum and we both remain worried about his  cirrhosis. (He has ascites and marked splenomegaly). Wil repeat RUQ u/s to look at liver and assess for ascites   I have discussed the case with Dr. Hoyt Koch at [facility] transplant program and he confirms that he is not transplant candidate in the next 6 months until he is free from tobacco and undergoes substance counselling. He agrees with VAD as BTT if he is deteriorating.  The patient is critically ill with multiple organ systems failure and requires high complexity decision making for assessment and support, frequent evaluation and titration of therapies, application of advanced monitoring technologies and extensive interpretation of multiple databases.   Critical Care Time devoted to patient care services described in this note is 45 Minutes.  Length of Stay: 4  Glori Bickers MD 08/26/2016, 3:29 PM  Advanced Heart Failure Team Pager [phone] (M-F; 7a - 4p)  Please contact [facility] for night-coverage after hours (4p -7a ) and weekends on [email or web address]

## 2016-08-26 NOTE — Progress Notes (Signed)
Cardiac Output and wedge done with MD Bensimhon, calculations noted in MD Bensimhon's note.

## 2016-08-27 ENCOUNTER — Inpatient Hospital Stay (HOSPITAL_COMMUNITY): Payer: Medicare HMO

## 2016-08-27 DIAGNOSIS — I509 Heart failure, unspecified: Secondary | ICD-10-CM

## 2016-08-27 LAB — BASIC METABOLIC PANEL
ANION GAP: 8 (ref 5–15)
BUN: 17 mg/dL (ref 6–20)
CALCIUM: 8.8 mg/dL — AB (ref 8.9–10.3)
CO2: 35 mmol/L — ABNORMAL HIGH (ref 22–32)
Chloride: 91 mmol/L — ABNORMAL LOW (ref 101–111)
Creatinine, Ser: 1.22 mg/dL (ref 0.61–1.24)
GFR calc Af Amer: >60 mL/min (ref >60)
GLUCOSE: 218 mg/dL — AB (ref 65–99)
Potassium: 3.8 mmol/L (ref 3.5–5.1)
SODIUM: 134 mmol/L — AB (ref 135–145)

## 2016-08-27 LAB — RAPID URINE DRUG SCREEN, HOSP PERFORMED
Amphetamines: NOT DETECTED
Barbiturates: NOT DETECTED
Benzodiazepines: NOT DETECTED
Cocaine: NOT DETECTED
Opiates: NOT DETECTED
Tetrahydrocannabinol: NOT DETECTED

## 2016-08-27 LAB — HEPATITIS B SURFACE ANTIGEN: HEP B S AG: NEGATIVE

## 2016-08-27 LAB — PROTEIN S, TOTAL: PROTEIN S AG TOTAL: 80 % (ref 60–150)

## 2016-08-27 LAB — COOXEMETRY PANEL
CARBOXYHEMOGLOBIN: 1.3 % (ref 0.5–1.5)
METHEMOGLOBIN: 0.9 % (ref 0.0–1.5)
O2 Saturation: 48 %
Total hemoglobin: 11.8 g/dL — ABNORMAL LOW (ref 12.0–16.0)

## 2016-08-27 LAB — LUPUS ANTICOAGULANT PANEL
DRVVT: 36.5 s (ref 0.0–47.0)
PTT LA: 36.2 s (ref 0.0–51.9)

## 2016-08-27 LAB — HEPATITIS B CORE ANTIBODY, TOTAL: Hep B Core Total Ab: NEGATIVE

## 2016-08-27 LAB — HEMOGLOBIN A1C
Hgb A1c MFr Bld: 8.3 % — ABNORMAL HIGH (ref 4.8–5.6)
MEAN PLASMA GLUCOSE: 192 mg/dL

## 2016-08-27 LAB — HEPATITIS B SURFACE ANTIBODY,QUALITATIVE: HEP B S AB: NONREACTIVE

## 2016-08-27 LAB — GLUCOSE, CAPILLARY
GLUCOSE-CAPILLARY: 172 mg/dL — AB (ref 65–99)
GLUCOSE-CAPILLARY: 264 mg/dL — AB (ref 65–99)
Glucose-Capillary: 235 mg/dL — ABNORMAL HIGH (ref 65–99)

## 2016-08-27 LAB — HEPATITIS C ANTIBODY

## 2016-08-27 MED ORDER — TORSEMIDE 20 MG PO TABS
60.0000 mg | ORAL_TABLET | Freq: Every day | ORAL | Status: DC
  Administered 2016-08-27 – 2016-09-08 (×13): 60 mg via ORAL
  Filled 2016-08-27 (×13): qty 3

## 2016-08-27 MED FILL — Heparin Sodium (Porcine) 2 Unit/ML in Sodium Chloride 0.9%: INTRAMUSCULAR | Qty: 1000 | Status: AC

## 2016-08-27 NOTE — Progress Notes (Addendum)
2 Days Post-Op Procedure(s) (LRB): Right Heart Cath (N/A) Subjective: Ischemic CM , class 4 CHF On milrinone his cvp , CI are better but CO-Ox still low V/Q scan negative for P:E Last CT abd almost 6 months ago- will repeat w/o contrast to assess liver, cardiac cirrhosis Anti-thrombin III level wnl, LFTs suggest adequate liver fx Objective: Vital signs in last 24 hours: Temp:  [96.8 F (36 C)-98.4 F (36.9 C)] 98.1 F (36.7 C) (11/24 1200) Pulse Rate:  [65-91] 84 (11/24 1227) Cardiac Rhythm: A-V Sequential paced (11/24 1200) Resp:  [12-25] 17 (11/24 1227) BP: (83-102)/(65-71) 96/71 (11/24 1227) SpO2:  [87 %-92 %] 92 % (11/24 0825) Weight:  [220 lb 6.4 oz (100 kg)] 220 lb 6.4 oz (100 kg) (11/24 0400)  Hemodynamic parameters for last 24 hours: PAP: (36-60)/(10-27) 60/23 CVP:  [9 mmHg-14 mmHg] 9 mmHg CO:  [5.3 L/min] 5.3 L/min CI:  [2.4 L/min/m2] 2.4 L/min/m2  Intake/Output from previous day: 11/23 0701 - 11/24 0700 In: 2110.8 [P.O.:1200; I.V.:610.8; IV Piggyback:300] Out: [id number] [Urine:4925] Intake/Output this shift: Total I/O In: 102 [I.V.:102] Out: 750 [Urine:750]  Chronically ill Still with ascites despite aggressive diuresis and maximum dose milrinone  Lab Results:  Recent Labs  08/25/16 0340 08/26/16 1530  WBC 7.2 7.0  HGB 10.9* 11.7*  HCT 33.0* 35.0*  PLT 71* 130*   BMET:  Recent Labs  08/26/16 0515 08/27/16 0455  NA 134* 134*  K 3.2* 3.8  CL 95* 91*  CO2 32 35*  GLUCOSE 274* 218*  BUN 22* 17  CREATININE 1.25* 1.22  CALCIUM 8.7* 8.8*    PT/INR: No results for input(s): LABPROT, INR in the last 72 hours. ABG    Component Value Date/Time   PHART 7.456 (H) 02/12/2016 1703   HCO3 33.1 (H) 08/25/2016 0911   HCO3 33.7 (H) 08/25/2016 0911   TCO2 35 08/25/2016 0911   TCO2 35 08/25/2016 0911   O2SAT 48.0 08/27/2016 0445   CBG (last 3)   Recent Labs  08/26/16 2128 08/27/16 0828 08/27/16 1230  GLUCAP 208* 235* 172*    Assessment/Plan: S/P  Procedure(s) (LRB): Right Heart Cath (N/A) Cont current care May need  liver biopsy to help guide therapy for advanced heart failure   LOS: 5 days    [name] 08/27/2016

## 2016-08-27 NOTE — Progress Notes (Signed)
Advanced Heart Failure Rounding Note  PCP: [facility] Primary Cardiologist: Dr. Haroldine Laws   Subjective:    Ronald Fenton Yerik Copp. is a 60 y.o. male with history of chronic systolic HF s/p Medtronic ICD 2014, CAD s/p CABG in 2000, HTN, Hx of cocaine abuse, Tobacco abuse, Depression, and COPD.   Has been on milrinone at home since 03/2016. Admitted with 12 lb weight gain over weekend and not responding to adjustment of diuretics.  Last Echo 8/17: LVEF 20% RV moderate to severe HK.  Admitted with recurrent HF.   RHC on 11/22 on milrinone 0.5 showed persistently elevated biventricular pressures. CI was ok. Lasix increased.   Feels fatigued.Brerathin ok Co-ox remains low at 48% on milrinone 0.5. Renal function stable. Diuresing well. Weight down about 10 pounds total. RUQ u/s with cirrhosis. No ascites  Swan numbers today (done personally) RA  10 PA  47/28 (33) PCWP 19 RA/PCWP  0.53 Thermo  6.6/3.0 Co-ox 48%  RHC 11/22 RA = 19 RV = 55/19 PA = 52/23 (33) PCW = 26 Fick cardiac output/index = 5.1/2.3 Thermo CO/CI = 6.5/2.9 PVR = 1.0 WU Ao sat = 95% PA sat = 55%, 57% RA/PCWP = 0.73  Objective:   Weight Range: 100 kg (220 lb 6.4 oz) Body mass index is 32.55 kg/m.   Vital Signs:   Temp:  [96.8 F (36 C)-98.4 F (36.9 C)] 98.1 F (36.7 C) (11/24 1200) Pulse Rate:  [65-91] 84 (11/24 1227) Resp:  [12-25] 17 (11/24 1227) BP: (83-102)/(65-71) 96/71 (11/24 1227) SpO2:  [87 %-92 %] 92 % (11/24 0825) Weight:  [100 kg (220 lb 6.4 oz)] 100 kg (220 lb 6.4 oz) (11/24 0400) Last BM Date: 08/25/16  Weight change: Filed Weights   08/25/16 0500 08/26/16 0500 08/27/16 0400  Weight: 106.5 kg (234 lb 14.4 oz) 100.2 kg (221 lb) 100 kg (220 lb 6.4 oz)    Intake/Output:   Intake/Output Summary (Last 24 hours) at 08/27/16 1555 Last data filed at 08/27/16 1400  Gross per 24 hour  Intake           1251.6 ml  Output             3500 ml  Net          -2248.4 ml      Physical Exam: General: lying flat in bed. Comfortable  HEENT: Normal Neck: supple. RIJ swan   Carotids 2+ bilat; no bruits. No thyromegaly or nodule noted.  Cor: PMI laterally displaced. RRR. No M/G/R.  Lungs: Decreased. Abdomen: soft, NT, mildly distended, no HSM. No bruits or masses. +BS  Extremities: no cyanosis, clubbing, rash, Trace to 1+ BLE edema. R shin ulcer heeling well. Neuro: alert & orientedx3, cranial nerves grossly intact. moves all 4 extremities w/o difficulty. Affect pleasant  Telemetry: Reviewed, NSR with frequent PVCs and V-pacing  Labs: CBC  Recent Labs  08/25/16 0340 08/26/16 1530  WBC 7.2 7.0  HGB 10.9* 11.7*  HCT 33.0* 35.0*  MCV 94.3 93.6  PLT 71* [id number]*   Basic Metabolic Panel  Recent Labs  08/25/16 0600 08/26/16 0515 08/27/16 0455  NA  --  134* 134*  K  --  3.2* 3.8  CL  --  95* 91*  CO2  --  32 35*  GLUCOSE  --  274* 218*  BUN  --  22* 17  CREATININE  --  1.25* 1.22  CALCIUM  --  8.7* 8.8*  MG 2.2  --   --  Liver Function Tests No results for input(s): AST, ALT, ALKPHOS, BILITOT, PROT, ALBUMIN in the last 72 hours. No results for input(s): LIPASE, AMYLASE in the last 72 hours. Cardiac Enzymes No results for input(s): CKTOTAL, CKMB, CKMBINDEX, TROPONINI in the last 72 hours.  BNP: BNP (last 3 results)  Recent Labs  03/10/16 0930 03/19/16 1230 08/22/16 1514  BNP 1,369.8* 1,079.6* 1,208.4*    ProBNP (last 3 results) No results for input(s): PROBNP in the last 8760 hours.   D-Dimer No results for input(s): DDIMER in the last 72 hours. Hemoglobin A1C  Recent Labs  08/26/16 0515  HGBA1C 8.3*   Fasting Lipid Panel No results for input(s): CHOL, HDL, LDLCALC, TRIG, CHOLHDL, LDLDIRECT in the last 72 hours. Thyroid Function Tests  Recent Labs  08/26/16 0515  TSH 2.419    Other results:     Imaging/Studies:  US Abdomen Limited Ruq  Result Date: 08/27/2016 CLINICAL DATA:  Cirrhosis and ascites. EXAM: US  ABDOMEN LIMITED - RIGHT UPPER QUADRANT COMPARISON:  Abdominal ultrasound. CT abdomen and pelvis 03/19/2016. FINDINGS: Gallbladder: Removed. Common bile duct: Diameter:  0.9 cm. Liver: Demonstrates increased echogenicity and a nodular border. No focal lesion or intrahepatic biliary ductal dilatation. No ascites is identified. IMPRESSION: Cirrhotic liver.  Negative for focal lesion or ascites. Electronically Signed   By: Inge Rise M.D.   On: 08/27/2016 09:25    Latest Echo  Latest Cath   Medications:     Scheduled Medications: . aspirin EC  81 mg Oral Daily  . atorvastatin  20 mg Oral QHS  . digoxin  250 mcg Oral Daily  . escitalopram  20 mg Oral Daily  . furosemide  80 mg Intravenous BID  . gabapentin  100 mg Oral BID  . heparin  5,000 Units Subcutaneous Q8H  . insulin aspart  0-15 Units Subcutaneous TID WC  . insulin aspart  0-5 Units Subcutaneous QHS  . insulin aspart  5 Units Subcutaneous TID WC  . insulin glargine  40 Units Subcutaneous QHS  . losartan  12.5 mg Oral BID  . multivitamin with minerals  1 tablet Oral Daily  . nicotine  21 mg Transdermal Q24H  . potassium chloride  40 mEq Oral BID  . sodium chloride flush  3 mL Intravenous Q12H  . spironolactone  25 mg Oral Daily  . vancomycin  750 mg Intravenous Q12H    Infusions: . milrinone 0.5 mcg/kg/min (08/27/16 1200)    PRN Medications: sodium chloride, acetaminophen, albuterol, baclofen, ondansetron (ZOFRAN) IV, senna, sodium chloride flush, sodium chloride flush, technetium TC 18M diethylenetriame-pentaacetic acid, traMADol, zolpidem   Assessment/Plan   1. Acute on Chronic end-stage biventricular Systolic HF LVEF 0000000 due to ICM --> cardiogenic shock - Echo 08/24/16 LVEF 15%, RV mild dilated, moderately reduced.  -NYHA IV symptoms on milrinone 0.5 mcg/kg/min.  - RHC on 11/22 as above - Swan numbers improved. Will continue milrinone. Switch to po diuretics. Likely pull swan in am.  - No BB for now with  ongoing low output -Blood type O+. Transplant listing at [facility] put on hold due to ongoing tobacco use and + urine tox screen with cocaine (which he denies)  2. AKI on CKD III - Improved with increased milrinone - Continue to follow closely.   3. CAD - No CP.  --severe 3v- CAD s/p CABG with occluded grafts as above except for LIMA. No targets for revascularization. No CP.  --Continue ASA, statin  4. DMII - Cover with SSI in house.  5.  RLE cellulitis - Covering with Vancomycin for now with poor po tolerance.  6. Tobacco abuse - States he has stopped smoking 1-2 weeks PTA  - Nicotine and Cotinine were consistent with tobacco or tobacco cessation product use 08/20/16. 7. Hypokalemia --will supp 8. 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 Dr. Prescott Gum has ordered CT to further evaluate. May need biopsy.    Hemodynamics improved on milrinone (though co-ox still low). I think RV would likely tolerate VAD but I have discussed with Dr. Prescott Gum and we both remain worried about his cirrhosis which is confirmed on u/s today. CT now pending. We will need to make decision soon about VAD.    I have discussed the case with Dr. Hoyt Koch at [facility] transplant program earlier this week and he confirms that he is not transplant candidate in the next 6 months until he is free from tobacco and undergoes substance counselling. He agrees with HM-III VAD as BTT if he is deteriorating.  The patient is critically ill with multiple organ systems failure and requires high complexity decision making for assessment and support, frequent evaluation and titration of therapies, application of advanced monitoring technologies and extensive interpretation of multiple databases.   Critical Care Time devoted to patient care services described in this note is 45 Minutes.  Length of Stay: 5  Ronald Janish MD 08/27/2016, 3:55 PM  Advanced Heart Failure Team Pager [phone]  (M-F; 7a - 4p)  Please contact [facility] for night-coverage after hours (4p -7a ) and weekends on [email or web address]

## 2016-08-28 ENCOUNTER — Inpatient Hospital Stay (HOSPITAL_COMMUNITY): Payer: Medicare HMO

## 2016-08-28 LAB — GLUCOSE, CAPILLARY
Glucose-Capillary: 135 mg/dL — ABNORMAL HIGH (ref 65–99)
Glucose-Capillary: 201 mg/dL — ABNORMAL HIGH (ref 65–99)
Glucose-Capillary: 193 mg/dL — ABNORMAL HIGH (ref 65–99)
Glucose-Capillary: 197 mg/dL — ABNORMAL HIGH (ref 65–99)
Glucose-Capillary: 269 mg/dL — ABNORMAL HIGH (ref 65–99)

## 2016-08-28 LAB — BASIC METABOLIC PANEL
Anion gap: 9 (ref 5–15)
BUN: 23 mg/dL — ABNORMAL HIGH (ref 6–20)
CO2: 33 mmol/L — ABNORMAL HIGH (ref 22–32)
Calcium: 8.9 mg/dL (ref 8.9–10.3)
Chloride: 93 mmol/L — ABNORMAL LOW (ref 101–111)
Creatinine, Ser: 1.56 mg/dL — ABNORMAL HIGH (ref 0.61–1.24)
GFR calc Af Amer: 54 mL/min — ABNORMAL LOW (ref >60)
GFR calc non Af Amer: 47 mL/min — ABNORMAL LOW (ref >60)
Glucose, Bld: 229 mg/dL — ABNORMAL HIGH (ref 65–99)
Potassium: 4.5 mmol/L (ref 3.5–5.1)
Sodium: 135 mmol/L (ref 135–145)

## 2016-08-28 LAB — SURGICAL PCR SCREEN
MRSA, PCR: NEGATIVE
STAPHYLOCOCCUS AUREUS: NEGATIVE

## 2016-08-28 LAB — COOXEMETRY PANEL
Carboxyhemoglobin: 2.1 % — ABNORMAL HIGH (ref 0.5–1.5)
Methemoglobin: 0.8 % (ref 0.0–1.5)
O2 Saturation: 65.8 %
Total hemoglobin: 11.6 g/dL — ABNORMAL LOW (ref 12.0–16.0)

## 2016-08-28 NOTE — Progress Notes (Signed)
Advanced Heart Failure Rounding Note  PCP: [facility] Primary Cardiologist: Dr. Haroldine Laws   Subjective:    Ronald Miller. is a 60 y.o. male with history of chronic systolic HF s/p Medtronic ICD 2014, CAD s/p CABG in 2000, HTN, Hx of cocaine abuse, Tobacco abuse, Depression, and COPD.   Has been on milrinone at home since 03/2016. Admitted with 12 lb weight gain over weekend and not responding to adjustment of diuretics.  Last Echo 8/17: LVEF 20% RV moderate to severe HK.  Admitted with recurrent HF.   RHC on 11/22 on milrinone 0.5 showed persistently elevated biventricular pressures. CI was ok. Lasix increased.   Feels better. Brerathing ok. Swan site sore. Co-ox up today 48% -> 66% on milrinone 0.5. Creatinine up 1.2->1.5.   CT abdomen reviewed personally. Mild cirrhotic changes. Spleen normal. No ascites.   Luiz Blare numbers today (done personally) RA  11 PA  55/26 (34) PCWP 22 RA/PCWP  0.50 Thermo  5.0/2.3 Co-ox 65%  RHC 11/22 RA = 19 RV = 55/19 PA = 52/23 (33) PCW = 26 Fick cardiac output/index = 5.1/2.3 Thermo CO/CI = 6.5/2.9 PVR = 1.0 WU Ao sat = 95% PA sat = 55%, 57% RA/PCWP = 0.73  Objective:   Weight Range: 98.8 kg (217 lb 12.8 oz) Body mass index is 32.16 kg/m.   Vital Signs:   Temp:  [97.7 F (36.5 C)-99.5 F (37.5 C)] 98.8 F (37.1 C) (11/25 0800) Pulse Rate:  [75-84] 82 (11/25 0800) Resp:  [0-23] 20 (11/25 0800) BP: (93-111)/(53-74) 93/65 (11/25 0800) SpO2:  [92 %-96 %] 96 % (11/25 0800) Weight:  [98.8 kg (217 lb 12.8 oz)] 98.8 kg (217 lb 12.8 oz) (11/25 0455) Last BM Date: 08/27/16  Weight change: Filed Weights   08/26/16 0500 08/27/16 0400 08/28/16 0455  Weight: 100.2 kg (221 lb) 100 kg (220 lb 6.4 oz) 98.8 kg (217 lb 12.8 oz)    Intake/Output:   Intake/Output Summary (Last 24 hours) at 08/28/16 1056 Last data filed at 08/28/16 0801  Gross per 24 hour  Intake           1684.4 ml  Output             4625 ml    Net          -2940.6 ml     Physical Exam: General: sitting up in bed. Comfortable  HEENT: Normal Neck: supple. RIJ swan   Carotids 2+ bilat; no bruits. No thyromegaly or nodule noted.  Cor: PMI laterally displaced. RRR. No M/G/R.  Lungs: Decreased. Abdomen: soft, NT, mildly distended, no HSM. No bruits or masses. +BS  Extremities: no cyanosis, clubbing, rash, Trace BLE edema. R shin ulcer heeling well. Neuro: alert & orientedx3, cranial nerves grossly intact. moves all 4 extremities w/o difficulty. Affect pleasant  Telemetry: Reviewed, NSR with frequent PVCs and V-pacing  Labs: CBC  Recent Labs  08/26/16 1530  WBC 7.0  HGB 11.7*  HCT 35.0*  MCV 93.6  PLT [id number]*   Basic Metabolic Panel  Recent Labs  08/27/16 0455 08/28/16 0500  NA 134* 135  K 3.8 4.5  CL 91* 93*  CO2 35* 33*  GLUCOSE 218* 229*  BUN 17 23*  CREATININE 1.22 1.56*  CALCIUM 8.8* 8.9   Liver Function Tests No results for input(s): AST, ALT, ALKPHOS, BILITOT, PROT, ALBUMIN in the last 72 hours. No results for input(s): LIPASE, AMYLASE in the last 72 hours. Cardiac Enzymes No results for  input(s): CKTOTAL, CKMB, CKMBINDEX, TROPONINI in the last 72 hours.  BNP: BNP (last 3 results)  Recent Labs  03/10/16 0930 03/19/16 1230 08/22/16 1514  BNP 1,369.8* 1,079.6* 1,208.4*    ProBNP (last 3 results) No results for input(s): PROBNP in the last 8760 hours.   D-Dimer No results for input(s): DDIMER in the last 72 hours. Hemoglobin A1C  Recent Labs  08/26/16 0515  HGBA1C 8.3*   Fasting Lipid Panel No results for input(s): CHOL, HDL, LDLCALC, TRIG, CHOLHDL, LDLDIRECT in the last 72 hours. Thyroid Function Tests  Recent Labs  08/26/16 0515  TSH 2.419    Other results:     Imaging/Studies:  Ct Abdomen Pelvis Wo Contrast  Result Date: 08/28/2016 CLINICAL DATA:  History of cirrhosis EXAM: CT ABDOMEN AND PELVIS WITHOUT CONTRAST TECHNIQUE: Multidetector CT imaging of the abdomen and  pelvis was performed following the standard protocol without IV contrast. COMPARISON:  Ultrasound from the previous day. FINDINGS: Lower chest: Small left pleural effusion is noted. No focal infiltrate is seen. The heart is enlarged consistent with the given clinical history. Hepatobiliary: Liver shows no focal mass lesion. Very mild nodularity is seen. The gallbladder has been surgically removed. Pancreas: Unremarkable. No pancreatic ductal dilatation or surrounding inflammatory changes. Spleen: Normal in size without focal abnormality. Adrenals/Urinary Tract: The adrenal glands are unremarkable. The kidneys show no obstructive changes. The bladder is well distended Stomach/Bowel: The appendix is not well visualized. No inflammatory or obstructive changes are seen. Ingested material is noted within the stomach. Vascular/Lymphatic: Aortic atherosclerosis. No enlarged abdominal or pelvic lymph nodes. Reproductive: Prostate is unremarkable. Other: No abdominal wall hernia or abnormality. No abdominopelvic ascites. Musculoskeletal: No acute or significant osseous findings. IMPRESSION: Mild cirrhotic change of the liver stable from previous exams. No significant ascites is noted. No acute abnormality is seen. Electronically Signed   By: Inez Catalina M.D.   On: 08/28/2016 08:15   US Abdomen Limited Ruq  Result Date: 08/27/2016 CLINICAL DATA:  Cirrhosis and ascites. EXAM: US ABDOMEN LIMITED - RIGHT UPPER QUADRANT COMPARISON:  Abdominal ultrasound. CT abdomen and pelvis 03/19/2016. FINDINGS: Gallbladder: Removed. Common bile duct: Diameter:  0.9 cm. Liver: Demonstrates increased echogenicity and a nodular border. No focal lesion or intrahepatic biliary ductal dilatation. No ascites is identified. IMPRESSION: Cirrhotic liver.  Negative for focal lesion or ascites. Electronically Signed   By: Inge Rise M.D.   On: 08/27/2016 09:25    Latest Echo  Latest Cath   Medications:     Scheduled Medications: .  aspirin EC  81 mg Oral Daily  . atorvastatin  20 mg Oral QHS  . digoxin  250 mcg Oral Daily  . escitalopram  20 mg Oral Daily  . gabapentin  100 mg Oral BID  . heparin  5,000 Units Subcutaneous Q8H  . insulin aspart  0-15 Units Subcutaneous TID WC  . insulin aspart  0-5 Units Subcutaneous QHS  . insulin aspart  5 Units Subcutaneous TID WC  . insulin glargine  40 Units Subcutaneous QHS  . losartan  12.5 mg Oral BID  . multivitamin with minerals  1 tablet Oral Daily  . nicotine  21 mg Transdermal Q24H  . potassium chloride  40 mEq Oral BID  . sodium chloride flush  3 mL Intravenous Q12H  . spironolactone  25 mg Oral Daily  . torsemide  60 mg Oral Daily  . vancomycin  750 mg Intravenous Q12H    Infusions: . milrinone 0.5 mcg/kg/min (08/28/16 0800)  PRN Medications: sodium chloride, acetaminophen, albuterol, baclofen, ondansetron (ZOFRAN) IV, senna, sodium chloride flush, sodium chloride flush, technetium TC 21M diethylenetriame-pentaacetic acid, traMADol, zolpidem   Assessment/Plan   1. Acute on Chronic end-stage biventricular Systolic HF LVEF 0000000 due to ICM --> cardiogenic shock - Echo 08/24/16 LVEF 15%, RV mild dilated, moderately reduced.  -NYHA IV symptoms on milrinone 0.5 mcg/kg/min.  - RHC on 11/22 as above - Swan numbers improved. Will continue milrinone. Back on po diuretics. Pul swanl - No BB for now with ongoing low output -Blood type O+. Transplant listing at [facility] put on hold due to ongoing tobacco use and + urine tox screen with cocaine (which he denies)  2. AKI on CKD III - Improved with increased milrinone - Continue to follow closely.   3. CAD - No CP.  --severe 3v- CAD s/p CABG with occluded grafts as above except for LIMA. No targets for revascularization. No CP.  --Continue ASA, statin  4. DMII - Cover with SSI in house.  5. RLE cellulitis - Covering with Vancomycin for now with poor po tolerance.  6. Tobacco abuse - States he has stopped smoking  1-2 weeks PTA  - Nicotine and Cotinine were consistent with tobacco or tobacco cessation product use 08/20/16. 7. Hypokalemia --will supp 8. 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 Normal spleen. No ascites.    Hemodynamics improved on milrinone. I think RV would likely tolerate VAD but I have discussed with Dr. Prescott Gum and we both remain worried about his cirrhosis.  CT scan shows mild cirrhotic changes. Normal spleen. No ascites.   We will need to make decision soon about VAD - likely reasonable to proceed.    I have discussed the case with Dr. Hoyt Koch at [facility] transplant program earlier this week and he confirms that he is not transplant candidate in the next 6 months until he is free from tobacco and undergoes substance counselling. He agrees with HM-III VAD as BTT if he is deteriorating.  The patient is critically ill with multiple organ systems failure and requires high complexity decision making for assessment and support, frequent evaluation and titration of therapies, application of advanced monitoring technologies and extensive interpretation of multiple databases.   Critical Care Time devoted to patient care services described in this note is 35 Minutes.  Length of Stay: 6  Bensimhon, Daniel MD 08/28/2016, 10:56 AM  Advanced Heart Failure Team Pager [phone] (M-F; [place])  Please contact [facility] for night-coverage after hours (4p -7a ) and weekends on [email or web address]

## 2016-08-28 NOTE — Progress Notes (Signed)
Pharmacy Antibiotic Note  Ronald Miller. is a 60 y.o. male admitted on 08/22/2016 with RLE cellulitis.  Pharmacy has been consulted for vancomycin dosing. Today is day 6 of therapy. Renal function has been fluctuating but remains around baseline. Patient is afebrile and WBC wnl.  Plan: - Continue vancomycin to 750 mg IV q12h - Monitor C&S, CBC and clinical progression - Follow up duration of therapy, probably needs about 7-10 days - Follow up ability to take PO and change to PO antibiotics.  Height: 5\' 9"  (175.3 cm) Weight: 217 lb 12.8 oz (98.8 kg) IBW/kg (Calculated) : 70.7  Temp (24hrs), Avg:98.5 F (36.9 C), Min:97.7 F (36.5 C), Max:99.5 F (37.5 C)   Recent Labs Lab 08/22/16 1514  08/24/16 0339 08/25/16 0340 08/25/16 0700 08/26/16 0515 08/26/16 1530 08/27/16 0455 08/28/16 0500  WBC 7.8  --   --  7.2  --   --  7.0  --   --   CREATININE 1.23  < > 1.47* 1.46*  --  1.25*  --  1.22 1.56*  VANCOTROUGH  --   --   --   --  17  --   --   --   --   < > = values in this interval not displayed.  Estimated Creatinine Clearance: 59.1 mL/min (by C-G formula based on SCr of 1.56 mg/dL (H)).    Allergies  Allergen Reactions  . Codeine Nausea And Vomiting  . Lipitor [Atorvastatin] Nausea Only    Nausea with high doses, tolerates 20mg  dose (08/22/16)     Antimicrobials this admission: 11/19 Vancomycin >  Dose Adjustments: 11/22: VT = 17 (true trough ~15.5) - dose continued  Microbiology: 11/19 MRSA PCR: neg 11/25 MRSA PCR: neg  Thank you for allowing pharmacy to be a part of this patient's care.  Dimitri Ped, PharmD. PGY-2 Infectious Diseases Pharmacy Resident Pager: [phone] 08/28/2016 11:14 AM

## 2016-08-29 LAB — BASIC METABOLIC PANEL
ANION GAP: 9 (ref 5–15)
BUN: 18 mg/dL (ref 6–20)
CALCIUM: 8.9 mg/dL (ref 8.9–10.3)
CO2: 28 mmol/L (ref 22–32)
CREATININE: 1.15 mg/dL (ref 0.61–1.24)
Chloride: 97 mmol/L — ABNORMAL LOW (ref 101–111)
GFR calc non Af Amer: >60 mL/min (ref >60)
Glucose, Bld: 295 mg/dL — ABNORMAL HIGH (ref 65–99)
Potassium: 4 mmol/L (ref 3.5–5.1)
SODIUM: 134 mmol/L — AB (ref 135–145)

## 2016-08-29 LAB — COOXEMETRY PANEL
CARBOXYHEMOGLOBIN: 1.6 % — AB (ref 0.5–1.5)
Methemoglobin: 0.8 % (ref 0.0–1.5)
O2 SAT: 68.4 %
Total hemoglobin: 12 g/dL (ref 12.0–16.0)

## 2016-08-29 LAB — GLUCOSE, CAPILLARY
Glucose-Capillary: 245 mg/dL — ABNORMAL HIGH (ref 65–99)
Glucose-Capillary: 315 mg/dL — ABNORMAL HIGH (ref 65–99)
Glucose-Capillary: 238 mg/dL — ABNORMAL HIGH (ref 65–99)

## 2016-08-29 NOTE — Progress Notes (Signed)
Advanced Heart Failure Rounding Note  PCP: [facility] Primary Cardiologist: Dr. Haroldine Laws   Subjective:    Ronald Miller. is a 60 y.o. male with history of chronic systolic HF s/p Medtronic ICD 2014, CAD s/p CABG in 2000, HTN, Hx of cocaine abuse, Tobacco abuse, Depression, and COPD.   Has been on milrinone at home since 03/2016. Admitted with 12 lb weight gain over weekend and not responding to adjustment of diuretics.  Last Echo 8/17: LVEF 20% RV moderate to severe HK.  Admitted with recurrent HF.   RHC on 11/22 on milrinone 0.5 showed persistently elevated biventricular pressures. CI was ok. Lasix increased.   Swan out 11/25. Feels better. Breathing better. More energy. Co-ox up 48% -> 66%-> 68% on milrinone 0.5. Creatinine up 1.2->1.5-> 1.15  CVP 5-6  CT abdomen reviewed personally. Mild cirrhotic changes. Spleen normal. No ascites.    [place] 11/22 RA = 19 RV = 55/19 PA = 52/23 (33) PCW = 26 Fick cardiac output/index = 5.1/2.3 Thermo CO/CI = 6.5/2.9 PVR = 1.0 WU Ao sat = 95% PA sat = 55%, 57% RA/PCWP = 0.73  Objective:   Weight Range: 96.3 kg (212 lb 4.9 oz) Body mass index is 31.35 kg/m.   Vital Signs:   Temp:  [97.5 F (36.4 C)-98.5 F (36.9 C)] 97.5 F (36.4 C) (11/26 0909) Pulse Rate:  [83-84] 84 (11/25 1200) Resp:  [20] 20 (11/26 0000) BP: (91-106)/(60-78) 97/64 (11/26 0905) SpO2:  [96 %-100 %] 96 % (11/26 0905) Weight:  [96.3 kg (212 lb 4.9 oz)] 96.3 kg (212 lb 4.9 oz) (11/26 0909) Last BM Date: 08/27/16  Weight change: Filed Weights   08/27/16 0400 08/28/16 0455 08/29/16 0909  Weight: 100 kg (220 lb 6.4 oz) 98.8 kg (217 lb 12.8 oz) 96.3 kg (212 lb 4.9 oz)    Intake/Output:   Intake/Output Summary (Last 24 hours) at 08/29/16 1044 Last data filed at 08/29/16 0800  Gross per 24 hour  Intake             1238 ml  Output             5800 ml  Net            -4562 ml     Physical Exam: General: sitting up in chair.  Comfortable  HEENT: Normal Neck: supple. Carotids 2+ bilat; no bruits. No thyromegaly or nodule noted.  Cor: PMI laterally displaced. RRR. No M/G/R.  Lungs: Decreased but clear Abdomen: soft, NT, mildly distended, no HSM. No bruits or masses. +BS  Extremities: no cyanosis, clubbing, rash, Trace BLE edema. R shin ulcer heeling well. Neuro: alert & orientedx3, cranial nerves grossly intact. moves all 4 extremities w/o difficulty. Affect pleasant  Telemetry: Reviewed, NSR with frequent PVCs and V-pacing  Labs: CBC  Recent Labs  08/26/16 1530  WBC 7.0  HGB 11.7*  HCT 35.0*  MCV 93.6  PLT [id number]*   Basic Metabolic Panel  Recent Labs  08/28/16 0500 08/29/16 0620  NA 135 134*  K 4.5 4.0  CL 93* 97*  CO2 33* 28  GLUCOSE 229* 295*  BUN 23* 18  CREATININE 1.56* 1.15  CALCIUM 8.9 8.9   Liver Function Tests No results for input(s): AST, ALT, ALKPHOS, BILITOT, PROT, ALBUMIN in the last 72 hours. No results for input(s): LIPASE, AMYLASE in the last 72 hours. Cardiac Enzymes No results for input(s): CKTOTAL, CKMB, CKMBINDEX, TROPONINI in the last 72 hours.  BNP: BNP (last  3 results)  Recent Labs  03/10/16 0930 03/19/16 1230 08/22/16 1514  BNP 1,369.8* 1,079.6* 1,208.4*    ProBNP (last 3 results) No results for input(s): PROBNP in the last 8760 hours.   D-Dimer No results for input(s): DDIMER in the last 72 hours. Hemoglobin A1C No results for input(s): HGBA1C in the last 72 hours. Fasting Lipid Panel No results for input(s): CHOL, HDL, LDLCALC, TRIG, CHOLHDL, LDLDIRECT in the last 72 hours. Thyroid Function Tests No results for input(s): TSH, T4TOTAL, T3FREE, THYROIDAB in the last 72 hours.  Invalid input(s): FREET3  Other results:     Imaging/Studies:  Ct Abdomen Pelvis Wo Contrast  Result Date: 08/28/2016 CLINICAL DATA:  History of cirrhosis EXAM: CT ABDOMEN AND PELVIS WITHOUT CONTRAST TECHNIQUE: Multidetector CT imaging of the abdomen and pelvis was  performed following the standard protocol without IV contrast. COMPARISON:  Ultrasound from the previous day. FINDINGS: Lower chest: Small left pleural effusion is noted. No focal infiltrate is seen. The heart is enlarged consistent with the given clinical history. Hepatobiliary: Liver shows no focal mass lesion. Very mild nodularity is seen. The gallbladder has been surgically removed. Pancreas: Unremarkable. No pancreatic ductal dilatation or surrounding inflammatory changes. Spleen: Normal in size without focal abnormality. Adrenals/Urinary Tract: The adrenal glands are unremarkable. The kidneys show no obstructive changes. The bladder is well distended Stomach/Bowel: The appendix is not well visualized. No inflammatory or obstructive changes are seen. Ingested material is noted within the stomach. Vascular/Lymphatic: Aortic atherosclerosis. No enlarged abdominal or pelvic lymph nodes. Reproductive: Prostate is unremarkable. Other: No abdominal wall hernia or abnormality. No abdominopelvic ascites. Musculoskeletal: No acute or significant osseous findings. IMPRESSION: Mild cirrhotic change of the liver stable from previous exams. No significant ascites is noted. No acute abnormality is seen. Electronically Signed   By: Inez Catalina M.D.   On: 08/28/2016 08:15    Latest Echo  Latest Cath   Medications:     Scheduled Medications: . aspirin EC  81 mg Oral Daily  . atorvastatin  20 mg Oral QHS  . digoxin  250 mcg Oral Daily  . escitalopram  20 mg Oral Daily  . gabapentin  100 mg Oral BID  . heparin  5,000 Units Subcutaneous Q8H  . insulin aspart  0-15 Units Subcutaneous TID WC  . insulin aspart  0-5 Units Subcutaneous QHS  . insulin aspart  5 Units Subcutaneous TID WC  . insulin glargine  40 Units Subcutaneous QHS  . losartan  12.5 mg Oral BID  . multivitamin with minerals  1 tablet Oral Daily  . nicotine  21 mg Transdermal Q24H  . spironolactone  25 mg Oral Daily  . torsemide  60 mg Oral  Daily  . vancomycin  750 mg Intravenous Q12H    Infusions: . milrinone 0.5 mcg/kg/min (08/29/16 0800)    PRN Medications: acetaminophen, albuterol, baclofen, ondansetron (ZOFRAN) IV, senna, sodium chloride flush, technetium TC 65M diethylenetriame-pentaacetic acid, traMADol, zolpidem   Assessment/Plan   1. Acute on Chronic end-stage biventricular Systolic HF LVEF 0000000 due to ICM --> cardiogenic shock - Echo 08/24/16 LVEF 15%, RV mild dilated, moderately reduced.  -NYHA IV symptoms on milrinone 0.5 mcg/kg/min.  - RHC on 11/22 as above - Swan now out. CVP down to 5-6. Back on po diuretics. - No BB for now with ongoing low output -Blood type O+. Transplant listing at [facility] put on hold due to ongoing tobacco use and + urine tox screen with cocaine (which he denies)  2. AKI on  CKD III - Improved with increased milrinone - Continue to follow closely.   3. CAD - No CP.  --severe 3v- CAD s/p CABG with occluded grafts as above except for LIMA. No targets for revascularization. No CP.  --Continue ASA, statin  4. DMII - Cover with SSI in house.  5. RLE cellulitis - Healing. Covering with Vancomycin for now with poor po tolerance. Can likely stop soon. 6. Tobacco abuse - States he has stopped smoking 1-2 weeks PTA  - Nicotine and Cotinine were consistent with tobacco or tobacco cessation product use 08/20/16. 7. Hypokalemia --improved 8. 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 Normal spleen. No ascites.    Hemodynamics improved on milrinone. I think RV would likely tolerate VAD but I have discussed with Dr. Prescott Gum and we both remain worried about his cirrhosis.  CT scan shows mild cirrhotic changes. Normal spleen. No ascites.   We will need to make decision soon about VAD - likely reasonable to proceed. Will discuss at [facility] tomorrow.    I have discussed the case with Dr. Hoyt Koch at [facility] transplant program earlier this week and he confirms that he  is not transplant candidate in the next 6 months until he is free from tobacco and undergoes substance counselling. He agrees with HM-III VAD as BTT if he is deteriorating.   Length of Stay: 7  Glori Bickers MD 08/29/2016, 10:44 AM  Advanced Heart Failure Team Pager [phone] (M-F; [place])  Please contact [facility] for night-coverage after hours (4p -7a ) and weekends on [email or web address]

## 2016-08-30 ENCOUNTER — Encounter (HOSPITAL_COMMUNITY): Payer: Medicare HMO

## 2016-08-30 LAB — BASIC METABOLIC PANEL
Anion gap: 9 (ref 5–15)
BUN: 19 mg/dL (ref 6–20)
CHLORIDE: 94 mmol/L — AB (ref 101–111)
CO2: 29 mmol/L (ref 22–32)
CREATININE: 1.2 mg/dL (ref 0.61–1.24)
Calcium: 9.1 mg/dL (ref 8.9–10.3)
GFR calc Af Amer: >60 mL/min (ref >60)
GFR calc non Af Amer: >60 mL/min (ref >60)
GLUCOSE: 272 mg/dL — AB (ref 65–99)
Potassium: 3.8 mmol/L (ref 3.5–5.1)
Sodium: 132 mmol/L — ABNORMAL LOW (ref 135–145)

## 2016-08-30 LAB — GLUCOSE, CAPILLARY
GLUCOSE-CAPILLARY: 196 mg/dL — AB (ref 65–99)
Glucose-Capillary: 267 mg/dL — ABNORMAL HIGH (ref 65–99)
Glucose-Capillary: 250 mg/dL — ABNORMAL HIGH (ref 65–99)
Glucose-Capillary: 188 mg/dL — ABNORMAL HIGH (ref 65–99)

## 2016-08-30 LAB — COOXEMETRY PANEL
Carboxyhemoglobin: 1.4 % (ref 0.5–1.5)
Methemoglobin: 0.9 % (ref 0.0–1.5)
O2 SAT: 60.7 %
TOTAL HEMOGLOBIN: 12.8 g/dL (ref 12.0–16.0)

## 2016-08-30 MED ORDER — INSULIN GLARGINE 100 UNIT/ML ~~LOC~~ SOLN
48.0000 [IU] | Freq: Every day | SUBCUTANEOUS | Status: DC
  Administered 2016-08-30 – 2016-09-02 (×4): 48 [IU] via SUBCUTANEOUS
  Filled 2016-08-30 (×5): qty 0.48

## 2016-08-30 MED ORDER — INSULIN ASPART 100 UNIT/ML ~~LOC~~ SOLN
10.0000 [IU] | Freq: Three times a day (TID) | SUBCUTANEOUS | Status: DC
  Administered 2016-08-30 – 2016-09-08 (×26): 10 [IU] via SUBCUTANEOUS

## 2016-08-30 NOTE — Progress Notes (Signed)
Patient transferred to room 4 N 10. Ambulated to room with our any difficulty

## 2016-08-30 NOTE — Progress Notes (Signed)
Advanced Heart Failure Rounding Note  PCP: [facility] Primary Cardiologist: Dr. Haroldine Laws   Subjective:    Ronald Miller Ronald Miller. is a 60 y.o. male with history of chronic systolic HF s/p Medtronic ICD 2014, CAD s/p CABG in 2000, HTN, Hx of cocaine abuse, Tobacco abuse, Depression, and COPD.   Has been on milrinone at home since 03/2016. Admitted with 12 lb weight gain over weekend and not responding to adjustment of diuretics.  Last Echo 8/17: LVEF 20% RV moderate to severe HK.  Admitted with recurrent HF.   RHC on 11/22 on milrinone 0.5 showed persistently elevated biventricular pressures. CI was ok. Lasix increased.   Swan out 11/25.   Denies SOB.  CO-OX up 48% -> 66%-> 68>61% on milrinone 0.5. Creatinine up 1.2->1.5-> 1.15->1.2   CVP 7  CT abdomen--> Mild cirrhotic changes. Spleen normal. No ascites.    [place] 11/22 RA = 19 RV = 55/19 PA = 52/23 (33) PCW = 26 Fick cardiac output/index = 5.1/2.3 Thermo CO/CI = 6.5/2.9 PVR = 1.0 WU Ao sat = 95% PA sat = 55%, 57% RA/PCWP = 0.73  Objective:   Weight Range: 212 lb (96.2 kg) Body mass index is 31.31 kg/m.   Vital Signs:   Temp:  [97.5 F (36.4 C)-98.4 F (36.9 C)] 98 F (36.7 C) (11/27 0457) Pulse Rate:  [77-90] 89 (11/27 0457) Resp:  [20] 20 (11/26 2000) BP: (92-107)/(53-78) 96/78 (11/27 0457) SpO2:  [93 %-96 %] 93 % (11/27 0457) Weight:  [212 lb (96.2 kg)-212 lb 4.9 oz (96.3 kg)] 212 lb (96.2 kg) (11/27 0457) Last BM Date: 08/27/16  Weight change: Filed Weights   08/28/16 0455 08/29/16 0909 08/30/16 0457  Weight: 217 lb 12.8 oz (98.8 kg) 212 lb 4.9 oz (96.3 kg) 212 lb (96.2 kg)    Intake/Output:   Intake/Output Summary (Last 24 hours) at 08/30/16 0708 Last data filed at 08/30/16 [id number]  Gross per 24 hour  Intake           1380.8 ml  Output             5875 ml  Net          -4494.2 ml     Physical Exam: CVP 8-9 General: In the bed. NAD.  HEENT: Normal Neck: supple. Carotids 2+  bilat; no bruits. No thyromegaly or nodule noted.  Cor: PMI laterally displaced. RRR. No M/G/R.  Lungs: Decreased but clear Abdomen: soft, NT, mildly distended, no HSM. No bruits or masses. +BS  Extremities: no cyanosis, clubbing, rash, Trace BLE edema. RLE anterior aspect 1x1cm thin scab. No erythema.   Neuro: alert & orientedx3, cranial nerves grossly intact. moves all 4 extremities w/o difficulty. Affect pleasant  Telemetry:  NSR with frequent PVCs and V-pacing  Labs: CBC No results for input(s): WBC, NEUTROABS, HGB, HCT, MCV, PLT in the last 72 hours. Basic Metabolic Panel  Recent Labs  08/29/16 0620 08/30/16 0406  NA 134* 132*  K 4.0 3.8  CL 97* 94*  CO2 28 29  GLUCOSE 295* 272*  BUN 18 19  CREATININE 1.15 1.20  CALCIUM 8.9 9.1   Liver Function Tests No results for input(s): AST, ALT, ALKPHOS, BILITOT, PROT, ALBUMIN in the last 72 hours. No results for input(s): LIPASE, AMYLASE in the last 72 hours. Cardiac Enzymes No results for input(s): CKTOTAL, CKMB, CKMBINDEX, TROPONINI in the last 72 hours.  BNP: BNP (last 3 results)  Recent Labs  03/10/16 0930 03/19/16 1230 08/22/16  1514  BNP 1,369.8* 1,079.6* 1,208.4*    ProBNP (last 3 results) No results for input(s): PROBNP in the last 8760 hours.   D-Dimer No results for input(s): DDIMER in the last 72 hours. Hemoglobin A1C No results for input(s): HGBA1C in the last 72 hours. Fasting Lipid Panel No results for input(s): CHOL, HDL, LDLCALC, TRIG, CHOLHDL, LDLDIRECT in the last 72 hours. Thyroid Function Tests No results for input(s): TSH, T4TOTAL, T3FREE, THYROIDAB in the last 72 hours.  Invalid input(s): FREET3  Other results:     Imaging/Studies:  No results found.  Latest Echo  Latest Cath   Medications:     Scheduled Medications: . aspirin EC  81 mg Oral Daily  . atorvastatin  20 mg Oral QHS  . digoxin  250 mcg Oral Daily  . escitalopram  20 mg Oral Daily  . gabapentin  100 mg Oral  BID  . heparin  5,000 Units Subcutaneous Q8H  . insulin aspart  0-15 Units Subcutaneous TID WC  . insulin aspart  0-5 Units Subcutaneous QHS  . insulin aspart  5 Units Subcutaneous TID WC  . insulin glargine  40 Units Subcutaneous QHS  . losartan  12.5 mg Oral BID  . multivitamin with minerals  1 tablet Oral Daily  . nicotine  21 mg Transdermal Q24H  . spironolactone  25 mg Oral Daily  . torsemide  60 mg Oral Daily  . vancomycin  750 mg Intravenous Q12H    Infusions: . milrinone 0.5 mcg/kg/min (08/30/16 0627)    PRN Medications: acetaminophen, albuterol, baclofen, ondansetron (ZOFRAN) IV, senna, sodium chloride flush, technetium TC 85M diethylenetriame-pentaacetic acid, traMADol, zolpidem   Assessment/Plan   1. Acute on Chronic end-stage biventricular Systolic HF LVEF 0000000 due to ICM --> cardiogenic shock - Echo 08/24/16 LVEF 15%, RV mild dilated, moderately reduced.  -NYHA IV symptoms on milrinone 0.5 mcg/kg/min.  - RHC on 11/22 as above -Todays CO-OX is 61%. . CVP 8-9. Continue torsemide 60 mg daily. Renal function stable.  - No BB for now with ongoing low output -Blood type O+. Transplant listing at [facility] put on hold due to ongoing tobacco use and + urine tox screen with cocaine (which he denies)  2. AKI on CKD III -Improved with increased milrinone - Continue to follow closely. Creatinine 1.2   3. CAD - No CP.  --severe 3v- CAD s/p CABG with occluded grafts as above except for LIMA. No targets for revascularization. No CP.  --Continue ASA, statin  4. DMII -Hgb A1C 8.3 on 08/26/2016  - Cover with SSI in house.  5. RLE cellulitis -1.x1 cm thin scab. Completed 8 days on Vancomycin.  6. Tobacco abuse - States he has stopped smoking 1-2 weeks PTA  - Nicotine and Cotinine were consistent with tobacco or tobacco cessation product use 08/20/16. 7. Hypokalemia --improved 8. 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 Normal spleen. No  ascites.   Per Dr Haroldine Laws RV would likely tolerate VAD but I have discussed with Dr. Prescott Gum and we both remain worried about his cirrhosis.  CT scan shows mild cirrhotic changes. Normal spleen. No ascites.  Plan to discuss at [facility] today.    Per Dr. Hoyt Koch at [facility] transplant program earlier this week and he confirms that he is not transplant candidate in the next 6 months until he is free from tobacco and undergoes substance counselling. He agrees with HM-III VAD as BTT if he is deteriorating.   Length of Stay:  [place] NP-C  08/30/2016, 7:08 AM  Advanced Heart Failure Team Pager [phone] (M-F; 7a - 4p)  Please contact [facility] for night-coverage after hours (4p -7a ) and weekends on [email or web address]  Patient seen and examined with Darrick Grinder, NP. We discussed all aspects of the encounter. I agree with the assessment and plan as stated above.   He is stable today on high-dose milrinone. CVP down. Renal function stable. Co-ox ok. Mild cirrhosis on CT.  Tests reviewed with him. Meeting with SW now to assess family support.   Will discuss further at [facility] meeting to day to assess candidacy for VAD therapy - possibly this week.   Bensimhon, Daniel,MD 1:09 PM

## 2016-08-30 NOTE — Progress Notes (Signed)
CARDIAC REHAB PHASE I   PRE:  Rate/Rhythm: 91  paced  BP:  Supine:   Sitting: 108/68  Standing:    SaO2: 91%RA  MODE:  Ambulation: 270 ft   POST:  Rate/Rhythm: 110 paced  BP:  Supine:   Sitting: 108/70  Standing:    SaO2: 95%RA 1500-1520 Pt walked 270 ft on RA with steady gait. Legs started to cramp with walk. Otherwise tolerated well. Back to recliner.   Graylon Good, RN BSN  08/30/2016 3:17 PM

## 2016-08-30 NOTE — Progress Notes (Signed)
Inpatient Diabetes Program Recommendations  AACE/ADA: New Consensus Statement on Inpatient Glycemic Control (2015)  Target Ranges:  Prepandial:   less than 140 mg/dL      Peak postprandial:   less than 180 mg/dL (1-2 hours)      Critically ill patients:  140 - 180 mg/dL   Lab Results  Component Value Date   GLUCAP 250 (H) 08/30/2016   HGBA1C 8.3 (H) 08/26/2016    Review of Glycemic Control Results for Ronald Miller, Ronald Miller (MRN [id number]) as of 08/30/2016 08:53  Ref. Range 08/28/2016 08:06 08/28/2016 11:37 08/28/2016 16:03 08/28/2016 20:28 08/29/2016 12:44 08/29/2016 16:41 08/29/2016 21:27 08/30/2016 08:50  Glucose-Capillary Latest Ref Range: 65 - 99 mg/dL 201 (H) 193 (H) 197 (H) 269 (H) 245 (H) 315 (H) 238 (H) 250 (H)   Diabetes history:DM 2 Outpatient Diabetes medications: Lantus 48, Novolog 10 units TID Current orders for Inpatient glycemic control: Lantus 40 units, Novolog Moderate TID + HS, Novolog 5 units TID meal coverage  Inpatient Diabetes Program Recommendations:   Glucose increased into the 200's with meals with Novolog 5 meal coverage. Patient takes 10 units of meal coverage at home.  Please consider: - increase meal coverage to Novolog 8 units TID if patient consumes at least 50% of meals. - increase Lantus to 45 units daily  Thank you, Nani Gasser. Verdell Kincannon, RN, MSN, CDE Inpatient Glycemic Control Team Team Pager [phone] (8am-5pm) 08/30/2016 8:56 AM

## 2016-08-31 LAB — BASIC METABOLIC PANEL
Anion gap: 9 (ref 5–15)
BUN: 20 mg/dL (ref 6–20)
CHLORIDE: 96 mmol/L — AB (ref 101–111)
CO2: 29 mmol/L (ref 22–32)
CREATININE: 1.23 mg/dL (ref 0.61–1.24)
Calcium: 9.5 mg/dL (ref 8.9–10.3)
GFR calc non Af Amer: >60 mL/min (ref >60)
GLUCOSE: 233 mg/dL — AB (ref 65–99)
Potassium: 4.1 mmol/L (ref 3.5–5.1)
Sodium: 134 mmol/L — ABNORMAL LOW (ref 135–145)

## 2016-08-31 LAB — CBC
HEMATOCRIT: 38.4 % — AB (ref 39.0–52.0)
HEMOGLOBIN: 13.3 g/dL (ref 13.0–17.0)
MCH: 31.5 pg (ref 26.0–34.0)
MCHC: 34.6 g/dL (ref 30.0–36.0)
MCV: 91 fL (ref 78.0–100.0)
Platelets: 171 10*3/uL (ref 150–400)
RBC: 4.22 MIL/uL (ref 4.22–5.81)
RDW: 13.4 % (ref 11.5–15.5)
WBC: 7.2 10*3/uL (ref 4.0–10.5)

## 2016-08-31 LAB — GLUCOSE, CAPILLARY
GLUCOSE-CAPILLARY: 182 mg/dL — AB (ref 65–99)
GLUCOSE-CAPILLARY: 184 mg/dL — AB (ref 65–99)
Glucose-Capillary: 287 mg/dL — ABNORMAL HIGH (ref 65–99)
Glucose-Capillary: 209 mg/dL — ABNORMAL HIGH (ref 65–99)
Glucose-Capillary: 272 mg/dL — ABNORMAL HIGH (ref 65–99)

## 2016-08-31 LAB — COOXEMETRY PANEL
Carboxyhemoglobin: 1.5 % (ref 0.5–1.5)
METHEMOGLOBIN: 0.8 % (ref 0.0–1.5)
O2 Saturation: 70.7 %
TOTAL HEMOGLOBIN: 13.5 g/dL (ref 12.0–16.0)

## 2016-08-31 LAB — MAGNESIUM: Magnesium: 1.8 mg/dL (ref 1.7–2.4)

## 2016-08-31 MED ORDER — MAGNESIUM SULFATE 2 GM/50ML IV SOLN
2.0000 g | Freq: Once | INTRAVENOUS | Status: AC
  Administered 2016-08-31: 2 g via INTRAVENOUS
  Filled 2016-08-31: qty 50

## 2016-08-31 NOTE — Progress Notes (Signed)
1445 Pt in meeting. Will hold ambulation at this time and continue to follow. Graylon Good RN BSN 08/31/2016 2:45 PM

## 2016-08-31 NOTE — Progress Notes (Signed)
Advanced Heart Failure Rounding Note  PCP: [facility] Primary Cardiologist: Dr. Haroldine Laws   Subjective:    Ronald Miller Esther Sellars. is a 60 y.o. male with history of chronic systolic HF s/p Medtronic ICD 2014, CAD s/p CABG in 2000, HTN, Hx of cocaine abuse, Tobacco abuse, Depression, and COPD.   Has been on milrinone at home since 03/2016. Admitted with 12 lb weight gain over weekend and not responding to adjustment of diuretics.  Last Echo 8/17: LVEF 20% RV moderate to severe HK.  Admitted with recurrent HF.   RHC on 11/22 on milrinone 0.5 showed persistently elevated biventricular pressures. CI was ok. Lasix increased.   Swan out 11/25.   Denies SOB.    CO-OX 71% on milrinone 0.5. Creatinine up 1.2   CVP 7  CT abdomen--> Mild cirrhotic changes. Spleen normal. No ascites.    Parks 11/22 RA = 19 RV = 55/19 PA = 52/23 (33) PCW = 26 Fick cardiac output/index = 5.1/2.3 Thermo CO/CI = 6.5/2.9 PVR = 1.0 WU Ao sat = 95% PA sat = 55%, 57% RA/PCWP = 0.73  Objective:   Weight Range: 215 lb (97.5 kg) Body mass index is 31.75 kg/m.   Vital Signs:   Temp:  [97.4 F (36.3 C)-98.1 F (36.7 C)] 97.9 F (36.6 C) (11/28 0350) Pulse Rate:  [83-91] 87 (11/28 0600) Resp:  [20-23] 23 (11/28 0350) BP: (96-117)/(58-79) 109/68 (11/28 0600) SpO2:  [92 %-96 %] 92 % (11/28 0350) Weight:  [215 lb (97.5 kg)] 215 lb (97.5 kg) (11/28 0350) Last BM Date: 08/27/16  Weight change: Filed Weights   08/29/16 0909 08/30/16 0457 08/31/16 0350  Weight: 212 lb 4.9 oz (96.3 kg) 212 lb (96.2 kg) 215 lb (97.5 kg)    Intake/Output:   Intake/Output Summary (Last 24 hours) at 08/31/16 0745 Last data filed at 08/31/16 0700  Gross per 24 hour  Intake           2175.6 ml  Output             3150 ml  Net           -974.4 ml     Physical Exam: CVP 6-7 General: In the chair. NAD.  HEENT: Normal Neck: supple. Carotids 2+ bilat; no bruits. No thyromegaly or nodule noted.  Cor: PMI  laterally displaced. RRR. No M/G/R.  Lungs: Decreased but clear Abdomen: soft, NT, mildly distended, no HSM. No bruits or masses. +BS  Extremities: no cyanosis, clubbing, rash, no edema. RLE anterior aspect 1x1cm thin scab. No erythema.   Neuro: alert & orientedx3, cranial nerves grossly intact. moves all 4 extremities w/o difficulty. Affect pleasant  Telemetry:  NSR with frequent PVCs and V-pacing  Labs: CBC  Recent Labs  08/31/16 0420  WBC 7.2  HGB 13.3  HCT 38.4*  MCV 91.0  PLT [id number]   Basic Metabolic Panel  Recent Labs  08/30/16 0406 08/31/16 0420  NA 132* 134*  K 3.8 4.1  CL 94* 96*  CO2 29 29  GLUCOSE 272* 233*  BUN 19 20  CREATININE 1.20 1.23  CALCIUM 9.1 9.5  MG  --  1.8   Liver Function Tests No results for input(s): AST, ALT, ALKPHOS, BILITOT, PROT, ALBUMIN in the last 72 hours. No results for input(s): LIPASE, AMYLASE in the last 72 hours. Cardiac Enzymes No results for input(s): CKTOTAL, CKMB, CKMBINDEX, TROPONINI in the last 72 hours.  BNP: BNP (last 3 results)  Recent Labs  03/10/16  0930 03/19/16 1230 08/22/16 1514  BNP 1,369.8* 1,079.6* 1,208.4*    ProBNP (last 3 results) No results for input(s): PROBNP in the last 8760 hours.   D-Dimer No results for input(s): DDIMER in the last 72 hours. Hemoglobin A1C No results for input(s): HGBA1C in the last 72 hours. Fasting Lipid Panel No results for input(s): CHOL, HDL, LDLCALC, TRIG, CHOLHDL, LDLDIRECT in the last 72 hours. Thyroid Function Tests No results for input(s): TSH, T4TOTAL, T3FREE, THYROIDAB in the last 72 hours.  Invalid input(s): FREET3  Other results:     Imaging/Studies:  No results found.  Latest Echo  Latest Cath   Medications:     Scheduled Medications: . aspirin EC  81 mg Oral Daily  . atorvastatin  20 mg Oral QHS  . digoxin  250 mcg Oral Daily  . escitalopram  20 mg Oral Daily  . gabapentin  100 mg Oral BID  . heparin  5,000 Units Subcutaneous Q8H  .  insulin aspart  0-15 Units Subcutaneous TID WC  . insulin aspart  0-5 Units Subcutaneous QHS  . insulin aspart  10 Units Subcutaneous TID WC  . insulin glargine  48 Units Subcutaneous QHS  . losartan  12.5 mg Oral BID  . multivitamin with minerals  1 tablet Oral Daily  . nicotine  21 mg Transdermal Q24H  . spironolactone  25 mg Oral Daily  . torsemide  60 mg Oral Daily    Infusions: . milrinone 0.5 mcg/kg/min (08/30/16 2356)    PRN Medications: acetaminophen, albuterol, baclofen, ondansetron (ZOFRAN) IV, senna, sodium chloride flush, technetium TC 60M diethylenetriame-pentaacetic acid, traMADol, zolpidem   Assessment/Plan   1. Acute on Chronic end-stage biventricular Systolic HF LVEF 0000000 due to ICM --> cardiogenic shock - Echo 08/24/16 LVEF 15%, RV mild dilated, moderately reduced.  -NYHA IV symptoms on milrinone 0.5 mcg/kg/min.  - RHC on 11/22 as above -Todays CO-OX is 70%. CVP 8-9. Continue torsemide 60 mg daily. Renal function stable.  - No BB for now with ongoing low output -Blood type O+. Transplant listing at [facility] put on hold due to ongoing tobacco use and + urine tox screen with cocaine (which he denies)  2. AKI on CKD III -Improved with increased milrinone - Continue to follow closely. Creatinine 1.2   3. CAD - No CP.  --severe 3v- CAD s/p CABG with occluded grafts as above except for LIMA. No targets for revascularization. No CP.  --Continue ASA, statin  4. DMII -Hgb A1C 8.3 on 08/26/2016  - Cover with SSI in house.  5. RLE cellulitis -1.x1 cm thin scab. Completed 8 days of Vancomycin.  6. Tobacco abuse - States he has stopped smoking 1-2 weeks PTA  - Nicotine and Cotinine were consistent with tobacco or tobacco cessation product use 08/20/16. 7. Hypokalemia --improved 8. 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 Normal spleen. No ascites.   Today we discussed that he may need home milrinone after LVAD implant. He  understands that is a possibility and he is willing to move forward even if he requires home milrinone.   Consult Palliative Care as part of LVAD work. SW evaluated for LVAD.   RV would likely tolerate VAD.  Some concern regarding cirrhosis, but CT scan shows mild cirrhotic changes with normal spleen and no ascites.  He was discussed at [facility] yesterday with tentative plan to offer LVAD.   Per Dr. Hoyt Koch at [facility] transplant program, he is not transplant candidate in the next  6 months until he is free from tobacco and undergoes substance counselling. He agrees with HM-III VAD as BTT if he is deteriorating.   Length of Stay: [place] NP-C  08/31/2016, 7:45 AM  Advanced Heart Failure Team Pager [phone] (M-F; 7a - 4p)  Please contact [facility] for night-coverage after hours (4p -7a ) and weekends on [email or web address]  Patient seen with NP, agree with the above note.  Stable today.  Remains on milrinone 0.5.  Discussed at [facility] yesterday, tentative plan to offer LVAD.  He is aware that he may not be able to come completely off milrinone. Will need to meet with palliative care service.   Loralie Champagne 08/31/2016 8:27 AM

## 2016-08-31 NOTE — Care Management Important Message (Signed)
Important Message  Patient Details  Name: Ronald Miller. MRN: [id number] Date of Birth: 1955-10-08   Medicare Important Message Given:  Yes    Dolph Tavano Abena 08/31/2016, 11:46 AM

## 2016-08-31 NOTE — Consult Note (Signed)
Consultation Note Date: 08/31/2016   Patient Name: Ronald Miller.  DOB: [birth year]  MRN: [id number]  Age / Sex: 60 y.o., male  PCP: [facility] Referring Physician: Pixie Casino, MD  Reason for Consultation: LVAD evaluation   HPI/Patient Profile: 60 y.o. male  admitted on 08/22/2016 with  a past medical history significant for chronic systolic CHF with EF 0000000 by echo in 10/2015, mildly dilated RV, s/p medtronic AICD in 2014, CAD s/p CABG in 2000, HTN, remote tobacco and cocaine abuse, depression and COPD.   He has been admitted twice in May of this year for decompensated CHF with impressive diuresis (from 247 ->219 lbs) and subsequently later in the month with diuresis again down to 228 lbs. He was again admitted with CHF exacerbation in 03/2016 with low cardiac output and biventricular failure. He was started on milrinone gtts and sent home on 0.25 mcg/kg/min. He diuresed 14 lbs to a weight of 227 lbs. Most recent LVEF on echo was 20% with moderate to severe RV hypokinesis. He was referred to Dr. Hoyt Koch at [facility] for transplant work-up, but apparently this was put on hold due to ongoing tobacco use and + cocaine drug screen.  Has been on milrinone at home since 03/2016. Admitted  08-22-16 12 lb weight gain and not responding to adjustment of diuretics.  Last Echo 8/17: LVEF 20% RV moderate to severe HK.  Consideration of LVAD   Clinical Assessment and Goals of Care:  This NP Wadie Lessen reviewed medical records, received report from team, assessed the patient and then meet at the bedside with his mother briefly yesterday and alone with him today to discuss advanced directives and a preparedness plan in light of anticipated  LVAD .    Patient hopes this  is a bridge to transplantation therapy.  A detailed discussion was had today regarding the concept of a preparedness  plan as it relates to LVAD therpay with intention of bridge to transplantation.   Patient and family were comfortable talking about the "what ifs" and the importance of today's conversation so everyone can have all the information to be full participants and to understand the patient's basic beliefs and wishes as it relates to  patient centered care.  Concepts specific to future possibilities of -long term ventilation -artificial feeding and hydration -long term antibiotic use -dialysis -psychological adjustments -need to terminate the pump-   Patient was able to verbalize to his family the importance of quality of life.  He is hopeful that LVAD procedure will increase his quality of life, and quantity to be eligible for tranplantation.    He understands the risks and benefits of the procedure as it relates to his future.  At this time patient is open to all available medical interventions to prolong life and the success of the LVAD therapy. He shared and verbalized an understanding that his family, particularly his sister (is documenting her to be HPOA)  would know when the burdens of treatment would outweigh the benefits  and trusts in his  family's support at that time.  Patient and family were encouraged to continue conversation into the future as it is vital for the patient centered care.   Code Status/Advance Care Planning:  Full code   Psycho-social/Spiritual:   Desire for further Chaplaincy support:yes  We did discuss his past drug and tobacco use briefly, he is well aware of its impact on his current situation and future options.  He "is going to do whatever he needs to do" to move forward with the LVAD and hopefully transplant.  He seems motivated.    We discussed coping strategies in dealing with current  hospitalization and related anxiety and "boredom".  His faith is important to him and he loves country music.  I provided him with some coloring books and playing cards,  discussed use of coloring can be utilized as a mindful/relaxing activity.     Primary Diagnoses: Present on Admission: . Acute systolic congestive heart failure, NYHA class 4 ([place]) . Dyslipidemia associated with type 2 diabetes mellitus ([place]) . Emesis . Cellulitis and abscess of leg   I have reviewed the medical record, interviewed the patient and family, and examined the patient. The following aspects are pertinent.  Past Medical History:  Diagnosis Date  . AICD (automatic cardioverter/defibrillator) present   . ASCVD (arteriosclerotic cardiovascular disease)   . Chronic systolic CHF (congestive heart failure) ([place])   . COPD (chronic obstructive pulmonary disease) ([place])   . Coronary artery disease   . Depression   . Diabetes mellitus   . History of cocaine abuse   . Hypertension   . Presence of permanent cardiac pacemaker   . Shortness of breath dyspnea   . Suicidal ideation   . Tobacco abuse    Social History   Social History  . Marital status: Single    Spouse name: N/A  . Number of children: N/A  . Years of education: N/A   Occupational History  . Retired   . Truck driver    Social History Main Topics  . Smoking status: Former Smoker    Packs/day: 1.50    Years: 23.00    Types: Cigarettes    Quit date: 08/27/2015  . Smokeless tobacco: Never Used  . Alcohol use No  . Drug use: No     Comment: Former  . Sexual activity: Not Currently   Other Topics Concern  . None   Social History Narrative   Divorced with one child   No regular exercise   Family History  Problem Relation Age of Onset  . CAD Father   . Diabetes Father   . Heart failure Father    Scheduled Meds: . aspirin EC  81 mg Oral Daily  . atorvastatin  20 mg Oral QHS  . digoxin  250 mcg Oral Daily  . escitalopram  20 mg Oral Daily  . gabapentin  100 mg Oral BID  . heparin  5,000 Units Subcutaneous Q8H  . insulin aspart  0-15 Units Subcutaneous TID WC  . insulin aspart  0-5 Units  Subcutaneous QHS  . insulin aspart  10 Units Subcutaneous TID WC  . insulin glargine  48 Units Subcutaneous QHS  . losartan  12.5 mg Oral BID  . multivitamin with minerals  1 tablet Oral Daily  . nicotine  21 mg Transdermal Q24H  . spironolactone  25 mg Oral Daily  . torsemide  60 mg Oral Daily   Continuous Infusions: . milrinone 0.5 mcg/kg/min (08/31/16 1200)  PRN Meds:.acetaminophen, albuterol, baclofen, ondansetron (ZOFRAN) IV, senna, sodium chloride flush, technetium TC 50M diethylenetriame-pentaacetic acid, traMADol, zolpidem Medications Prior to Admission:  Prior to Admission medications   Medication Sig Start Date End Date Taking? Authorizing Provider  acetaminophen (TYLENOL) 500 MG tablet Take 1,000 mg by mouth every 6 (six) hours as needed for mild pain or headache.   Yes Historical Provider, MD  albuterol (PROVENTIL HFA;VENTOLIN HFA) 108 (90 BASE) MCG/ACT inhaler Inhale 2 puffs into the lungs every 6 (six) hours as needed for wheezing or shortness of breath.   Yes Historical Provider, MD  aspirin EC 81 MG tablet Take 81 mg by mouth daily.    Yes Historical Provider, MD  atorvastatin (LIPITOR) 20 MG tablet Take 20 mg by mouth at bedtime.    Yes Historical Provider, MD  baclofen (LIORESAL) 10 MG tablet Take 10 mg by mouth 3 (three) times daily as needed for muscle spasms.   Yes Historical Provider, MD  digoxin (LANOXIN) 0.25 MG tablet TAKE 1 TABLET EVERY DAY 07/27/16  Yes Jolaine Artist, MD  escitalopram (LEXAPRO) 20 MG tablet Take 1 tablet (20 mg total) by mouth daily. 09/02/15  Yes Vipul Manuella Ghazi, MD  insulin aspart (NOVOLOG FLEXPEN) 100 UNIT/ML FlexPen Inject 10 Units into the skin 3 (three) times daily with meals.   Yes Historical Provider, MD  insulin glargine (LANTUS) 100 unit/mL SOPN Inject 48 Units into the skin at bedtime.   Yes Historical Provider, MD  isosorbide mononitrate (IMDUR) 30 MG 24 hr tablet Take 1 tablet (30 mg total) by mouth daily. 08/10/16  Yes Jolaine Artist, MD  losartan (COZAAR) 25 MG tablet Take 0.5 tablets (12.5 mg total) by mouth 2 (two) times daily. 08/04/16  Yes [name] Friar, PA-C  metolazone (ZAROXOLYN) 5 MG tablet Take 5 mg by mouth daily as needed (for 4-5 lb weight gain).    Yes Historical Provider, MD  metoprolol succinate (TOPROL-XL) 100 MG 24 hr tablet Take 1 tablet (100 mg total) by mouth daily. Take with or immediately following a meal. 07/28/16  Yes Jolaine Artist, MD  milrinone (PRIMACOR) 20 MG/100 ML SOLN infusion Inject 41.025 mcg/min into the vein continuous. 06/30/16  Yes Jolaine Artist, MD  Multiple Vitamin (MULTIVITAMIN WITH MINERALS) TABS tablet Take 1 tablet by mouth daily. Sentry/Centrum   Yes Historical Provider, MD  Omega-3 Fatty Acids (FISH OIL) 1000 MG CAPS Take 1,000 mg by mouth daily with supper.    Yes Historical Provider, MD  potassium chloride SA (K-DUR,KLOR-CON) 20 MEQ tablet Take 2 tablets (40 mEq total) by mouth 3 (three) times daily. Patient taking differently: Take 40 mEq by mouth See admin instructions. Take 2 tablets (40 mg) by mouth 3 times daily, may also take 1 extra tablet (20 mg)if taking extra torsemide due to 3-5 lbs weight gain 08/12/16  Yes Amy D Clegg, NP  senna (SENOKOT) 8.6 MG TABS tablet Take 2 tablets (17.2 mg total) by mouth at bedtime as needed. Contact PCP for further refills, this is a courtesy refill Patient taking differently: Take 2 tablets by mouth at bedtime as needed (constipation).  08/06/16  Yes [name] Friar, PA-C  spironolactone (ALDACTONE) 25 MG tablet Take 1 tablet (25 mg total) by mouth daily. 08/10/16  Yes Jolaine Artist, MD  torsemide (DEMADEX) 20 MG tablet Take 2 tablets (40 mg total) by mouth 2 (two) times daily. Patient taking differently: Take 40 mg by mouth See admin instructions. Take 2 tablets (40 mg) by mouth  twice daily, may also take another 2 tablets (40 mg) as needed for weight gain of 3-5 lbs 06/08/16  Yes Jolaine Artist, MD    varenicline (CHANTIX) 1 MG tablet Take 1 mg by mouth 2 (two) times daily.   Yes Historical Provider, MD  zolpidem (AMBIEN) 5 MG tablet Take 1 tablet (5 mg total) by mouth at bedtime as needed for sleep. 01/18/16  Yes Lytle Butte, MD  gabapentin (NEURONTIN) 100 MG capsule Take 100 mg by mouth. 08/17/16   Historical Provider, MD  nicotine (NICODERM CQ - DOSED IN MG/24 HOURS) 21 mg/24hr patch Place 1 patch (21 mg total) onto the skin daily. Patient not taking: Reported on 08/22/2016 07/30/16   Jolaine Artist, MD   Allergies  Allergen Reactions  . Codeine Nausea And Vomiting  . Lipitor [Atorvastatin] Nausea Only    Nausea with high doses, tolerates 20mg  dose (08/22/16)   Review of Systems  Constitutional: Positive for activity change and fatigue.  Neurological: Positive for weakness.    Physical Exam  Vital Signs: BP 114/75 (BP Location: Left Arm)   Pulse 87   Temp 97.9 F (36.6 C) (Oral)   Resp 19   Ht 5\' 9"  (1.753 m)   Wt 97.5 kg (215 lb)   SpO2 92%   BMI 31.75 kg/m  Pain Assessment: No/denies pain POSS *See Group Information*: 2-Acceptable,Slightly drowsy, easily aroused Pain Score: Asleep   SpO2: SpO2: 92 % O2 Device:SpO2: 92 % O2 Flow Rate: .O2 Flow Rate (L/min): 2 L/min  IO: Intake/output summary:  Intake/Output Summary (Last 24 hours) at 08/31/16 1356 Last data filed at 08/31/16 1200  Gross per 24 hour  Intake           2150.8 ml  Output             3625 ml  Net          -1474.2 ml    LBM: Last BM Date: 08/23/16 Baseline Weight: Weight: 102.1 kg (225 lb) Most recent weight: Weight: 97.5 kg (215 lb)     Palliative Assessment/Data:   Flowsheet Rows   Flowsheet Row Most Recent Value  Intake Tab  Referral Department  Cardiology  Unit at Time of Referral  ICU  Palliative Care Primary Diagnosis  Cardiac  Date Notified  08/30/16  Palliative Care Type  Return patient Palliative Care  Reason for referral  Clarify Goals of Care  Date of Admission   08/22/16  Date first seen by Palliative Care  08/31/16  # of days Palliative referral response time  1 Day(s)  # of days IP prior to Palliative referral  8  Clinical Assessment  Psychosocial & Spiritual Assessment  Palliative Care Outcomes      Time In: 1100 Time Out: 1200 Time Total: 60 min Greater than 50%  of this time was spent counseling and coordinating care related to the above assessment and plan.  Signed by: Wadie Lessen, NP   Please contact Palliative Medicine Team phone at [phone] for questions and concerns.  For individual provider: See Shea Evans

## 2016-08-31 NOTE — Progress Notes (Signed)
CSW met with patient at bedside to discuss future with LVAD if approved for implant. CSW reviewed all of the equipment and possibly needing to maintain milirone as well post implant. Patient states in reference to all the potential equipment he would need to carry  "I will do whatever I have too". Patient appears to understand the gravity of the situation and had the opportunity to meet with another LVAD patient who also maintains LVAD equipment along with milirone bag. Patient states it "was helpful to speak with someone who is living it". Patient's mother also at bedside later in the day and CSW reviewed caregiver role and need for 24/7 care post implant hospitalization. Patient's mother agreeable to care and states "I will do whatever is needed for my child". CSW will continue to follow for supportive intervention around LVAD implant. Raquel Sarna, LCSW [phone]

## 2016-08-31 NOTE — Progress Notes (Signed)
   08/31/16 1000  Clinical Encounter Type  Visited With Patient  Visit Type Other (Comment) (AD)  Spiritual Encounters  Spiritual Needs Emotional  Stress Factors  Patient Stress Factors Health changes  Introduction to Pt. Explained and provided copy of Advanced Directive. Pt. Will look over. Follow up tomorrow.

## 2016-09-01 DIAGNOSIS — K7469 Other cirrhosis of liver: Secondary | ICD-10-CM

## 2016-09-01 DIAGNOSIS — Z515 Encounter for palliative care: Secondary | ICD-10-CM

## 2016-09-01 DIAGNOSIS — K7460 Unspecified cirrhosis of liver: Secondary | ICD-10-CM

## 2016-09-01 LAB — CBC
HCT: 38.6 % — ABNORMAL LOW (ref 39.0–52.0)
Hemoglobin: 13.4 g/dL (ref 13.0–17.0)
MCH: 31.6 pg (ref 26.0–34.0)
MCHC: 34.7 g/dL (ref 30.0–36.0)
MCV: 91 fL (ref 78.0–100.0)
PLATELETS: 177 10*3/uL (ref 150–400)
RBC: 4.24 MIL/uL (ref 4.22–5.81)
RDW: 13.3 % (ref 11.5–15.5)
WBC: 6.6 10*3/uL (ref 4.0–10.5)

## 2016-09-01 LAB — BASIC METABOLIC PANEL
Anion gap: 11 (ref 5–15)
BUN: 21 mg/dL — AB (ref 6–20)
CALCIUM: 9.2 mg/dL (ref 8.9–10.3)
CO2: 27 mmol/L (ref 22–32)
CREATININE: 1.25 mg/dL — AB (ref 0.61–1.24)
Chloride: 96 mmol/L — ABNORMAL LOW (ref 101–111)
Glucose, Bld: 291 mg/dL — ABNORMAL HIGH (ref 65–99)
Potassium: 4.1 mmol/L (ref 3.5–5.1)
SODIUM: 134 mmol/L — AB (ref 135–145)

## 2016-09-01 LAB — COOXEMETRY PANEL
CARBOXYHEMOGLOBIN: 1.6 % — AB (ref 0.5–1.5)
METHEMOGLOBIN: 0.7 % (ref 0.0–1.5)
O2 SAT: 51.6 %
Total hemoglobin: 13.6 g/dL (ref 12.0–16.0)

## 2016-09-01 LAB — PROTHROMBIN GENE MUTATION

## 2016-09-01 LAB — GLUCOSE, CAPILLARY
GLUCOSE-CAPILLARY: 303 mg/dL — AB (ref 65–99)
GLUCOSE-CAPILLARY: 136 mg/dL — AB (ref 65–99)
Glucose-Capillary: 205 mg/dL — ABNORMAL HIGH (ref 65–99)
Glucose-Capillary: 233 mg/dL — ABNORMAL HIGH (ref 65–99)

## 2016-09-01 LAB — FACTOR 5 LEIDEN

## 2016-09-01 LAB — MAGNESIUM: MAGNESIUM: 2 mg/dL (ref 1.7–2.4)

## 2016-09-01 NOTE — Progress Notes (Signed)
Advanced Heart Failure Rounding Note  PCP: [facility] Primary Cardiologist: Dr. Haroldine Laws   Subjective:    Ronald Fenton Trevyon Doud. is a 60 y.o. male with history of chronic systolic HF s/p Medtronic ICD 2014, CAD s/p CABG in 2000, HTN, Hx of cocaine abuse, Tobacco abuse, Depression, and COPD.   Has been on milrinone at home since 03/2016. Admitted with 12 lb weight gain over weekend and not responding to adjustment of diuretics.  Last Echo 8/17: LVEF 20% RV moderate to severe HK.  Admitted with recurrent HF.   RHC on 11/22 on milrinone 0.5 showed persistently elevated biventricular pressures. CI was ok.  [place] out 11/25.    CO-OX 52% on milrinone 0.5. Creatinine 1.25.   CVP 5-6   CT abdomen--> Mild cirrhotic changes. Spleen normal. No ascites.    [place] 11/22 RA = 19 RV = 55/19 PA = 52/23 (33) PCW = 26 Fick cardiac output/index = 5.1/2.3 Thermo CO/CI = 6.5/2.9 PVR = 1.0 WU Ao sat = 95% PA sat = 55%, 57% RA/PCWP = 0.73  Objective:   Weight Range: 220 lb 9.6 oz (100.1 kg) Body mass index is 32.58 kg/m.   Vital Signs:   Temp:  [97.3 F (36.3 C)-98.2 F (36.8 C)] 97.6 F (36.4 C) (11/29 0700) Resp:  [15-23] 19 (11/29 0700) BP: (97-127)/(66-81) 101/73 (11/29 0700) SpO2:  [92 %-96 %] 96 % (11/29 0700) Weight:  [220 lb 9.6 oz (100.1 kg)] 220 lb 9.6 oz (100.1 kg) (11/29 0300) Last BM Date: 08/31/16  Weight change: Filed Weights   08/30/16 0457 08/31/16 0350 09/01/16 0300  Weight: 212 lb (96.2 kg) 215 lb (97.5 kg) 220 lb 9.6 oz (100.1 kg)    Intake/Output:   Intake/Output Summary (Last 24 hours) at 09/01/16 0925 Last data filed at 09/01/16 0400  Gross per 24 hour  Intake          1020.39 ml  Output             1350 ml  Net          -329.61 ml     Physical Exam: CVP 5-6 General: In the bed. NAD.  HEENT: Normal Neck: supple. Carotids 2+ bilat; no bruits. No thyromegaly or nodule noted.  Cor: PMI laterally displaced. RRR. No M/G/R.  Lungs:  Decreased but clear Abdomen: soft, NT, mildly distended, no HSM. No bruits or masses. +BS  Extremities: no cyanosis, clubbing, rash, no edema. RLE anterior aspect 1x1cm thin scab. No erythema.   Neuro: alert & orientedx3, cranial nerves grossly intact. moves all 4 extremities w/o difficulty. Affect pleasant  Telemetry:  NSR with frequent PVCs and V-pacing  Labs: CBC  Recent Labs  08/31/16 0420 09/01/16 0511  WBC 7.2 6.6  HGB 13.3 13.4  HCT 38.4* 38.6*  MCV 91.0 91.0  PLT 171 [id number]   Basic Metabolic Panel  Recent Labs  08/31/16 0420 09/01/16 0511  NA 134* 134*  K 4.1 4.1  CL 96* 96*  CO2 29 27  GLUCOSE 233* 291*  BUN 20 21*  CREATININE 1.23 1.25*  CALCIUM 9.5 9.2  MG 1.8 2.0   Liver Function Tests No results for input(s): AST, ALT, ALKPHOS, BILITOT, PROT, ALBUMIN in the last 72 hours. No results for input(s): LIPASE, AMYLASE in the last 72 hours. Cardiac Enzymes No results for input(s): CKTOTAL, CKMB, CKMBINDEX, TROPONINI in the last 72 hours.  BNP: BNP (last 3 results)  Recent Labs  03/10/16 0930 03/19/16 1230 08/22/16 1514  BNP 1,369.8* 1,079.6* 1,208.4*    ProBNP (last 3 results) No results for input(s): PROBNP in the last 8760 hours.   D-Dimer No results for input(s): DDIMER in the last 72 hours. Hemoglobin A1C No results for input(s): HGBA1C in the last 72 hours. Fasting Lipid Panel No results for input(s): CHOL, HDL, LDLCALC, TRIG, CHOLHDL, LDLDIRECT in the last 72 hours. Thyroid Function Tests No results for input(s): TSH, T4TOTAL, T3FREE, THYROIDAB in the last 72 hours.  Invalid input(s): FREET3  Other results:     Imaging/Studies:  No results found.  Latest Echo  Latest Cath   Medications:     Scheduled Medications: . aspirin EC  81 mg Oral Daily  . atorvastatin  20 mg Oral QHS  . digoxin  250 mcg Oral Daily  . escitalopram  20 mg Oral Daily  . gabapentin  100 mg Oral BID  . heparin  5,000 Units Subcutaneous Q8H  .  insulin aspart  0-15 Units Subcutaneous TID WC  . insulin aspart  0-5 Units Subcutaneous QHS  . insulin aspart  10 Units Subcutaneous TID WC  . insulin glargine  48 Units Subcutaneous QHS  . losartan  12.5 mg Oral BID  . multivitamin with minerals  1 tablet Oral Daily  . nicotine  21 mg Transdermal Q24H  . spironolactone  25 mg Oral Daily  . torsemide  60 mg Oral Daily    Infusions: . milrinone 0.5 mcg/kg/min (09/01/16 0519)    PRN Medications: acetaminophen, albuterol, baclofen, ondansetron (ZOFRAN) IV, senna, sodium chloride flush, technetium TC 35M diethylenetriame-pentaacetic acid, traMADol, zolpidem   Assessment/Plan   1. Acute on Chronic end-stage biventricular Systolic HF LVEF 0000000 due to ICM --> cardiogenic shock - Echo 08/24/16 LVEF 15%, RV mild dilated, moderately reduced.  -NYHA IV symptoms on milrinone 0.5 mcg/kg/min.  - RHC on 11/22 as above -Todays CO-OX is 52%. CVP 6-7. Continue torsemide 60 mg daily. Renal function stable.  - No BB for now with ongoing low output -Blood type O+. Transplant listing at [facility] put on hold due to ongoing tobacco use and + urine tox screen with cocaine (which he denies)  2. AKI on CKD III -Improved with increased milrinone - Continue to follow closely. Creatinine 1.2   3. CAD - No CP.  --severe 3v- CAD s/p CABG with occluded grafts as above except for LIMA. No targets for revascularization. No CP.  --Continue ASA, statin  4. DMII -Hgb A1C 8.3 on 08/26/2016  - Cover with SSI in house.  5. RLE cellulitis -1.x1 cm thin scab. Completed 8 days of Vancomycin.  6. Tobacco abuse - States he has stopped smoking 1-2 weeks PTA  - Nicotine and Cotinine were consistent with tobacco or tobacco cessation product use 08/20/16. 7. Hypokalemia --improved 8. 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 Normal spleen. No ascites.   He understands he may need home milrinone after LVAD implant. He understands that is  a possibility and he is willing to move forward even if he requires home milrinone.   Consult Palliative Care as part of LVAD work. SW evaluated for LVAD.   RV would likely tolerate VAD.  Some concern regarding cirrhosis, but CT scan shows mild cirrhotic changes with normal spleen and no ascites.  He was discussed at [facility] yesterday with tentative plan to offer LVAD.   Per Dr. Hoyt Koch at [facility] transplant program, he is not transplant candidate in the next 6 months until he is free from tobacco  and undergoes substance counselling. He agrees with HM-III VAD as BTT if he is deteriorating.   Length of Stay: [place] NP-C  09/01/2016, 9:25 AM  Advanced Heart Failure Team Pager [phone] (M-F; 7a - 4p)  Please contact [facility] for night-coverage after hours (4p -7a ) and weekends on [email or web address]  Patient seen and examined with Darrick Grinder, NP. We discussed all aspects of the encounter. I agree with the assessment and plan as stated above.   He is stable today on high-dose milrinone. CVP down. Renal function stable. Co-ox ok. Mild cirrhosis on CT.   We have discussed at [facility] and tentatively will offer him VAD therapy pending completion of work-up. Ideally will get HM-3 under BTT criteria. I discussed with Dr. Prescott Gum who would like repeat RHC on Friday. We will arrange. Possible VAD implant next week. Continue to mobilize.   Chaz Ronning,MD 6:43 PM    Will discuss further at [facility] meeting to day to assess candidacy for VAD therapy - possibly this week.

## 2016-09-01 NOTE — Progress Notes (Signed)
Inpatient Diabetes Program Recommendations  AACE/ADA: New Consensus Statement on Inpatient Glycemic Control (2015)  Target Ranges:  Prepandial:   less than 140 mg/dL      Peak postprandial:   less than 180 mg/dL (1-2 hours)      Critically ill patients:  140 - 180 mg/dL   Lab Results  Component Value Date   GLUCAP 303 (H) 09/01/2016   HGBA1C 8.3 (H) 08/26/2016    Review of Glycemic Control  Diabetes history:DM 2 Outpatient Diabetes medications: Lantus 48, Novolog 10 units TID Current orders for Inpatient glycemic control: Lantus 48 units, Novolog Moderate TID + HS, Novolog 10 units TID meal coverage  Inpatient Diabetes Program Recommendations:    Noted elevated fasting CBGs.  Please consider: - increase Lantus to 55 units  Thank you, Bethena Roys E. Aviendha Azbell, RN, MSN, CDE Inpatient Glycemic Control Team Team Pager [phone] (8am-5pm) 09/01/2016 10:37 AM

## 2016-09-01 NOTE — Progress Notes (Signed)
Initial Nutrition Assessment  DOCUMENTATION CODES:   Obesity unspecified  INTERVENTION:   -Continue MVI daily -Provided reinforcement on carb modified, heart healthy diet  NUTRITION DIAGNOSIS:   Altered nutrition lab value related to chronic illness (DM) as evidenced by  (Hgb A1c: 8.3).  GOAL:   Patient will meet greater than or equal to 90% of their needs   MONITOR:   PO intake, Labs, Weight trends, Skin, I & O's  REASON FOR ASSESSMENT:   Consult  (LVAD eval)  ASSESSMENT:   This is a 60 y.o. male with a past medical history significant for chronic systolic CHF with EF 0000000 by echo in 10/2015, mildly dilated RV, s/p medtronic AICD in 2014, CAD s/p CABG in 2000, HTN, remote cocaine abuse, smoking, depression and COPD. He has been admitted twice in May of this year for decompensated CHF with impressive diuresis (from 247 ->219 lbs) and subsequently later in the month with diuresis again down to 228 lbs. He was again admitted with CHF exacerbation in 03/2016 with low cardiac output and biventricular failure. He was started on milrinone gtts and sent home on 0.25 mcg/kg/min. He diuresed 14 lbs to a weight of 227 lbs. Most recent LVEF on echo was 20% with moderate to severe RV hypokinesis. He was referred to Dr. Hoyt Koch at [facility] for transplant work-up, but apparently this was put on hold due to ongoing tobacco use and + cocaine drug screen.  Pt admitted with acute on chronic CHF. Pt is considering LVAD.   Pt underwent rt heart cath on 08/25/16.   Reviewed CWOCN note; pt with a partial thickness abrasion to rt leg. MVI already ordered; agree with continuance to assist with wound healing.   Pt with very good appetite; noted 100% meal completion.   Wt hx reviewed. UBW ranges between 220-250#. Pt with wt gain PTA due to poor response from diuretics.   Spoke with pt at bedside, who reports great appetite. He shares that he has made drastic change in his diet over the past  few months, including adding fresh fruits and vegetables to his diet, eliminating canned foods, and eliminating salt in food preparation (however, he does use No-Salt occasionally to season foods at the table). Pt expressed frustration over limited food choices due to sodium restrictions. Reviewed diet recall and discussed ways pt could incorporate favorite foods within diet restrictions. In particular, discussed ways pt could choose more healthful side items wile eating at his favorite fish restaurant. Pt acknowledges high CBGS; discussed lower carbohydrate cereals and breakfast options and low calorie beverage alternatives. Teachback method used and encouraged pt to continue to make changes.   Nutrition-Focused physical exam completed. Findings are no fat depletion, no muscle depletion, and mild edema.   Pt with poorly controlled DM; noted last Hgb A1c: 8.3 on 08/26/16. DM coordinator following. Home medication regimen Lantus 48 units daily , Novolog 10 units TID. Current orders are as follows- Lantus 48 units, Novolog Moderate TID + HS, Novolog 10 units TID meal coverage. DM coordinator recommending increasing Lantus to 55 units to assist with elevated fasting CBGS.  Labs reviewed: Na: 134, CBGS: 209-303.   Diet Order:  Diet heart healthy/carb modified Room service appropriate? Yes; Fluid consistency: Thin  Skin:  Reviewed, no issues  Last BM:  08/31/16  Height:   Ht Readings from Last 1 Encounters:  08/22/16 5\' 9"  (1.753 m)    Weight:   Wt Readings from Last 1 Encounters:  09/01/16 220 lb 9.6 oz (100.1  kg)    Ideal Body Weight:  72.7 kg  BMI:  Body mass index is 32.58 kg/m.  Estimated Nutritional Needs:   Kcal:  1800-2000  Protein:  95-110 grams  Fluid:  per MD  EDUCATION NEEDS:   No education needs identified at this time  Tylea Hise A. Jimmye Norman, RD, LDN, CDE Pager: [phone] After hours Pager: [phone]
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
## 2016-09-01 NOTE — Progress Notes (Signed)
   09/01/16 1400  Clinical Encounter Type  Visited With Patient  Visit Type Follow-up  Spiritual Encounters  Spiritual Needs Emotional  Stress Factors  Patient Stress Factors Health changes  Assisted with completion of Advanced Directive. Notary and volunteers present. Original and 2 copies to Pt. 1 copy to chart.

## 2016-09-02 LAB — BASIC METABOLIC PANEL
ANION GAP: 11 (ref 5–15)
BUN: 20 mg/dL (ref 6–20)
CALCIUM: 9.2 mg/dL (ref 8.9–10.3)
CO2: 26 mmol/L (ref 22–32)
Chloride: 94 mmol/L — ABNORMAL LOW (ref 101–111)
Creatinine, Ser: 1.13 mg/dL (ref 0.61–1.24)
GFR calc Af Amer: >60 mL/min (ref >60)
GFR calc non Af Amer: >60 mL/min (ref >60)
GLUCOSE: 263 mg/dL — AB (ref 65–99)
Potassium: 4.1 mmol/L (ref 3.5–5.1)
Sodium: 131 mmol/L — ABNORMAL LOW (ref 135–145)

## 2016-09-02 LAB — COOXEMETRY PANEL
Carboxyhemoglobin: 1.9 % — ABNORMAL HIGH (ref 0.5–1.5)
Methemoglobin: 0.6 % (ref 0.0–1.5)
O2 Saturation: 67.1 %
TOTAL HEMOGLOBIN: 15.3 g/dL (ref 12.0–16.0)

## 2016-09-02 LAB — CBC
HEMATOCRIT: 39.8 % (ref 39.0–52.0)
Hemoglobin: 13.3 g/dL (ref 13.0–17.0)
MCH: 30.4 pg (ref 26.0–34.0)
MCHC: 33.4 g/dL (ref 30.0–36.0)
MCV: 91.1 fL (ref 78.0–100.0)
Platelets: 178 10*3/uL (ref 150–400)
RBC: 4.37 MIL/uL (ref 4.22–5.81)
RDW: 13.3 % (ref 11.5–15.5)
WBC: 7.2 10*3/uL (ref 4.0–10.5)

## 2016-09-02 LAB — GLUCOSE, CAPILLARY
GLUCOSE-CAPILLARY: 277 mg/dL — AB (ref 65–99)
GLUCOSE-CAPILLARY: 220 mg/dL — AB (ref 65–99)
Glucose-Capillary: 206 mg/dL — ABNORMAL HIGH (ref 65–99)

## 2016-09-02 NOTE — Progress Notes (Signed)
Advanced Heart Failure Rounding Note  PCP: [facility] Primary Cardiologist: Dr. Haroldine Laws   Subjective:   Ronald Miller. is a 60 y.o. male with history of chronic systolic HF s/p Medtronic ICD 2014, CAD s/p CABG in 2000, HTN, Hx of cocaine abuse, Tobacco abuse, Depression, and COPD.   Has been on milrinone at home since 03/2016. Admitted with 12 lb weight gain over weekend and not responding to adjustment of diuretics.  Last Echo 8/17: LVEF 20% RV moderate to severe HK.  Admitted with recurrent HF.   RHC on 11/22 on milrinone 0.5 showed persistently elevated biventricular pressures. CI was ok.  [place] out 11/25.    CO-OX 672% on milrinone 0.5. Creatinine 1.1.   CVP 4 .   Denies SOB.   CT abdomen--> Mild cirrhotic changes. Spleen normal. No ascites.   [place] 11/22 RA = 19 RV = 55/19 PA = 52/23 (33) PCW = 26 Fick cardiac output/index = 5.1/2.3 Thermo CO/CI = 6.5/2.9 PVR = 1.0 WU Ao sat = 95% PA sat = 55%, 57% RA/PCWP = 0.73  Objective:   Weight Range: 220 lb 8 oz (100 kg) Body mass index is 32.56 kg/m.   Vital Signs:   Temp:  [97.8 F (36.6 C)-98.7 F (37.1 C)] 97.8 F (36.6 C) (11/30 0759) Resp:  [13-19] 13 (11/30 0759) BP: (98-122)/(60-86) 103/60 (11/30 0759) Weight:  [220 lb 8 oz (100 kg)] 220 lb 8 oz (100 kg) (11/30 0406) Last BM Date: 09/01/16 (per pt)  Weight change: Filed Weights   08/31/16 0350 09/01/16 0300 09/02/16 0406  Weight: 215 lb (97.5 kg) 220 lb 9.6 oz (100.1 kg) 220 lb 8 oz (100 kg)    Intake/Output:   Intake/Output Summary (Last 24 hours) at 09/02/16 0941 Last data filed at 09/02/16 0800  Gross per 24 hour  Intake          2165.45 ml  Output             3350 ml  Net         -1184.55 ml     Physical Exam: CVP 4 General: In the bed. NAD.  HEENT: Normal Neck: supple. Carotids 2+ bilat; no bruits. No thyromegaly or nodule noted.  Cor: PMI laterally displaced. RRR. No M/G/R.  Lungs: Decreased but  clear Abdomen: soft, NT, mildly distended, no HSM. No bruits or masses. +BS  Extremities: no cyanosis, clubbing, rash, no edema. RLE anterior aspect 1x1cm thin scab. No erythema.   Neuro: alert & orientedx3, cranial nerves grossly intact. moves all 4 extremities w/o difficulty. Affect pleasant  Telemetry:  NSR with frequent PVCs and V-pacing  Labs: CBC  Recent Labs  09/01/16 0511 09/02/16 0400  WBC 6.6 7.2  HGB 13.4 13.3  HCT 38.6* 39.8  MCV 91.0 91.1  PLT 177 0000000   Basic Metabolic Panel  Recent Labs  08/31/16 0420 09/01/16 0511 09/02/16 0400  NA 134* 134* 131*  K 4.1 4.1 4.1  CL 96* 96* 94*  CO2 29 27 26   GLUCOSE 233* 291* 263*  BUN 20 21* 20  CREATININE 1.23 1.25* 1.13  CALCIUM 9.5 9.2 9.2  MG 1.8 2.0  --    Liver Function Tests No results for input(s): AST, ALT, ALKPHOS, BILITOT, PROT, ALBUMIN in the last 72 hours. No results for input(s): LIPASE, AMYLASE in the last 72 hours. Cardiac Enzymes No results for input(s): CKTOTAL, CKMB, CKMBINDEX, TROPONINI in the last 72 hours.  BNP: BNP (last 3 results)  Recent Labs  03/10/16 0930 03/19/16 1230 08/22/16 1514  BNP 1,369.8* 1,079.6* 1,208.4*    ProBNP (last 3 results) No results for input(s): PROBNP in the last 8760 hours.   D-Dimer No results for input(s): DDIMER in the last 72 hours. Hemoglobin A1C No results for input(s): HGBA1C in the last 72 hours. Fasting Lipid Panel No results for input(s): CHOL, HDL, LDLCALC, TRIG, CHOLHDL, LDLDIRECT in the last 72 hours. Thyroid Function Tests No results for input(s): TSH, T4TOTAL, T3FREE, THYROIDAB in the last 72 hours.  Invalid input(s): FREET3  Other results:     Imaging/Studies:  No results found.  Latest Echo  Latest Cath   Medications:     Scheduled Medications: . aspirin EC  81 mg Oral Daily  . atorvastatin  20 mg Oral QHS  . digoxin  250 mcg Oral Daily  . escitalopram  20 mg Oral Daily  . gabapentin  100 mg Oral BID  . heparin   5,000 Units Subcutaneous Q8H  . insulin aspart  0-15 Units Subcutaneous TID WC  . insulin aspart  0-5 Units Subcutaneous QHS  . insulin aspart  10 Units Subcutaneous TID WC  . insulin glargine  48 Units Subcutaneous QHS  . losartan  12.5 mg Oral BID  . multivitamin with minerals  1 tablet Oral Daily  . nicotine  21 mg Transdermal Q24H  . spironolactone  25 mg Oral Daily  . torsemide  60 mg Oral Daily    Infusions: . milrinone 0.5 mcg/kg/min (09/02/16 0800)    PRN Medications: acetaminophen, albuterol, baclofen, ondansetron (ZOFRAN) IV, senna, sodium chloride flush, technetium TC 70M diethylenetriame-pentaacetic acid, traMADol, zolpidem   Assessment/Plan   1. Acute on Chronic end-stage biventricular Systolic HF LVEF 0000000 due to ICM --> cardiogenic shock - Echo 08/24/16 LVEF 15%, RV mild dilated, moderately reduced.  -NYHA IV symptoms on milrinone 0.5 mcg/kg/min.  - RHC on 11/22 as above -Todays CO-OX is 67%. CVP 4. Continue torsemide 60 mg daily. Renal function stable.  - No BB for now with ongoing low output -Blood type O+. Transplant listing at [facility] put on hold due to ongoing tobacco use and + urine tox screen with cocaine (which he denies)  2. AKI on CKD III -Improved with increased milrinone - Continue to follow closely. Creatinine 1.13  3. CAD - No CP.  --severe 3v- CAD s/p CABG with occluded grafts as above except for LIMA. No targets for revascularization. No CP.  --Continue ASA, statin  4. DMII -Hgb A1C 8.3 on 08/26/2016  - Cover with SSI in house.  5. RLE cellulitis -1.x1 cm thin scab. Completed 8 days of Vancomycin.  6. Tobacco abuse - States he has stopped smoking 1-2 weeks PTA  - Nicotine and Cotinine were consistent with tobacco or tobacco cessation product use 08/20/16. 7. Hypokalemia --improved 8. 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 Normal spleen. No ascites.   He understands he may need home milrinone after LVAD  implant. He understands that is a possibility and he is willing to move forward even if he requires home milrinone.   Palliative Care consult completed. SW evaluated for LVAD.   RV would likely tolerate VAD.  Some concern regarding cirrhosis, but CT scan shows mild cirrhotic changes with normal spleen and no ascites.  He was discussed at [facility] yesterday with tentative plan to offer LVAD.   Per Dr. Hoyt Koch at [facility] transplant program, he is not transplant candidate in the next 6 months until  he is free from tobacco and undergoes substance counselling. He agrees with HM-III VAD as BTT if he is deteriorating.  Plan to [place] tomorrow to assess hemodynamics.   Length of Stay: [place] NP-C  09/02/2016, 9:41 AM  Advanced Heart Failure Team Pager [phone] (M-F; 7a - 4p)  Please contact [facility] for night-coverage after hours (4p -7a ) and weekends on [email or web address]  Patient seen and examined with Darrick Grinder, NP. We discussed all aspects of the encounter. I agree with the assessment and plan as stated above.   He is doing well. Hemodynamics stable. Walking with CR. He is eager to pursue VAD. Will plan RHC tomorrow to reassess. Continue milrinone. Possible VAD next Thursday.  Chadric Kimberley,MD 7:02 PM

## 2016-09-02 NOTE — Progress Notes (Signed)
CARDIAC REHAB PHASE I   PRE:  Rate/Rhythm: 90 paced  BP:  Supine:   Sitting: 114/68  Standing:    SaO2: 95%RA  MODE:  Ambulation: 940 ft   POST:  Rate/Rhythm: 114 paced  BP:  Supine:   Sitting: 100/77  Standing:    SaO2: 95%RA 1035-1107 Pt walked 940 ft with steady gait. Tolerated well. No SOB noted. Glad to be up walking. To recliner after walk.    Graylon Good, RN BSN  09/02/2016 11:04 AM

## 2016-09-03 ENCOUNTER — Encounter (HOSPITAL_COMMUNITY)
Admission: EM | Disposition: A | Discharge status: Home Health | Payer: Self-pay | Source: Home / Self Care | Attending: Internal Medicine

## 2016-09-03 DIAGNOSIS — N183 Chronic kidney disease, stage 3 (moderate): Secondary | ICD-10-CM

## 2016-09-03 DIAGNOSIS — I5023 Acute on chronic systolic (congestive) heart failure: Secondary | ICD-10-CM

## 2016-09-03 HISTORY — PX: CARDIAC CATHETERIZATION: SHX172

## 2016-09-03 LAB — POCT I-STAT 3, VENOUS BLOOD GAS (G3P V)
ACID-BASE EXCESS: 5 mmol/L — AB (ref 0.0–2.0)
BICARBONATE: 31.1 mmol/L — AB (ref 20.0–28.0)
O2 SAT: 66 %
PO2 VEN: 35 mmHg (ref 32.0–45.0)
TCO2: 33 mmol/L (ref 0–100)
pCO2, Ven: 51.1 mmHg (ref 44.0–60.0)
pH, Ven: 7.393 (ref 7.250–7.430)

## 2016-09-03 LAB — CBC
HCT: 37.3 % — ABNORMAL LOW (ref 39.0–52.0)
HEMATOCRIT: 38.2 % — AB (ref 39.0–52.0)
HEMOGLOBIN: 12.7 g/dL — AB (ref 13.0–17.0)
HEMOGLOBIN: 13.1 g/dL (ref 13.0–17.0)
MCH: 30.8 pg (ref 26.0–34.0)
MCH: 31.1 pg (ref 26.0–34.0)
MCHC: 34 g/dL (ref 30.0–36.0)
MCHC: 34.3 g/dL (ref 30.0–36.0)
MCV: 90.5 fL (ref 78.0–100.0)
MCV: 90.7 fL (ref 78.0–100.0)
Platelets: 167 10*3/uL (ref 150–400)
Platelets: 171 10*3/uL (ref 150–400)
RBC: 4.12 MIL/uL — ABNORMAL LOW (ref 4.22–5.81)
RBC: 4.21 MIL/uL — ABNORMAL LOW (ref 4.22–5.81)
RDW: 13.2 % (ref 11.5–15.5)
RDW: 13.2 % (ref 11.5–15.5)
WBC: 6.1 10*3/uL (ref 4.0–10.5)
WBC: 6.4 10*3/uL (ref 4.0–10.5)

## 2016-09-03 LAB — GLUCOSE, CAPILLARY
GLUCOSE-CAPILLARY: 103 mg/dL — AB (ref 65–99)
GLUCOSE-CAPILLARY: 199 mg/dL — AB (ref 65–99)
Glucose-Capillary: 136 mg/dL — ABNORMAL HIGH (ref 65–99)

## 2016-09-03 LAB — COOXEMETRY PANEL
CARBOXYHEMOGLOBIN: 1.5 % (ref 0.5–1.5)
Carboxyhemoglobin: 1.4 % (ref 0.5–1.5)
METHEMOGLOBIN: 1 % (ref 0.0–1.5)
Methemoglobin: 0.9 % (ref 0.0–1.5)
O2 Saturation: 79.6 %
O2 Saturation: 59.1 %
Total hemoglobin: 13.1 g/dL (ref 12.0–16.0)
Total hemoglobin: 13.9 g/dL (ref 12.0–16.0)

## 2016-09-03 LAB — CREATININE, SERUM: Creatinine, Ser: 1.07 mg/dL (ref 0.61–1.24)

## 2016-09-03 LAB — BASIC METABOLIC PANEL
Anion gap: 12 (ref 5–15)
BUN: 20 mg/dL (ref 6–20)
CHLORIDE: 92 mmol/L — AB (ref 101–111)
CO2: 25 mmol/L (ref 22–32)
CREATININE: 1.08 mg/dL (ref 0.61–1.24)
Calcium: 8.9 mg/dL (ref 8.9–10.3)
GFR calc Af Amer: >60 mL/min (ref >60)
GFR calc non Af Amer: >60 mL/min (ref >60)
GLUCOSE: 269 mg/dL — AB (ref 65–99)
Potassium: 3.7 mmol/L (ref 3.5–5.1)
SODIUM: 129 mmol/L — AB (ref 135–145)

## 2016-09-03 LAB — MAGNESIUM: Magnesium: 2.1 mg/dL (ref 1.7–2.4)

## 2016-09-03 LAB — PROTIME-INR
INR: 0.99
Prothrombin Time: 13.1 s (ref 11.4–15.2)

## 2016-09-03 SURGERY — RIGHT HEART CATH
Anesthesia: LOCAL

## 2016-09-03 MED ORDER — PREMIER PROTEIN SHAKE
2.0000 [oz_av] | Freq: Four times a day (QID) | ORAL | Status: DC
  Administered 2016-09-03 – 2016-09-05 (×7): 2 [oz_av] via ORAL
  Filled 2016-09-03 (×3): qty 325.31

## 2016-09-03 MED ORDER — SODIUM CHLORIDE 0.9 % IV SOLN: 250.0000 mL | INTRAVENOUS | Status: DC | PRN

## 2016-09-03 MED ORDER — FENTANYL CITRATE (PF) 100 MCG/2ML IJ SOLN
INTRAMUSCULAR | Status: AC
  Filled 2016-09-03: qty 2

## 2016-09-03 MED ORDER — ASPIRIN 81 MG PO CHEW
81.0000 mg | CHEWABLE_TABLET | ORAL | Status: AC
  Administered 2016-09-03: 81 mg via ORAL
  Filled 2016-09-03: qty 1

## 2016-09-03 MED ORDER — SODIUM CHLORIDE 0.9% FLUSH
3.0000 mL | Freq: Two times a day (BID) | INTRAVENOUS | Status: DC
  Administered 2016-09-07: 3 mL via INTRAVENOUS
  Administered 2016-09-08: 10 mL via INTRAVENOUS

## 2016-09-03 MED ORDER — HEPARIN SODIUM (PORCINE) 5000 UNIT/ML IJ SOLN: 5000.0000 [IU] | Freq: Three times a day (TID) | INTRAMUSCULAR | Status: DC

## 2016-09-03 MED ORDER — ACETAMINOPHEN 325 MG PO TABS
650.0000 mg | ORAL_TABLET | ORAL | Status: DC | PRN
  Administered 2016-09-05 – 2016-09-08 (×3): 650 mg via ORAL
  Filled 2016-09-03 (×3): qty 2

## 2016-09-03 MED ORDER — INSULIN GLARGINE 100 UNIT/ML ~~LOC~~ SOLN
28.0000 [IU] | Freq: Two times a day (BID) | SUBCUTANEOUS | Status: DC
Start: 2016-09-03 — End: 2016-09-09
  Administered 2016-09-03 – 2016-09-08 (×12): 28 [IU] via SUBCUTANEOUS
  Filled 2016-09-03 (×14): qty 0.28

## 2016-09-03 MED ORDER — SODIUM CHLORIDE 0.9% FLUSH: 3.0000 mL | INTRAVENOUS | Status: DC | PRN

## 2016-09-03 MED ORDER — SODIUM CHLORIDE 0.9 % IV SOLN: INTRAVENOUS | Status: DC

## 2016-09-03 MED ORDER — HEPARIN (PORCINE) IN NACL 2-0.9 UNIT/ML-% IJ SOLN
INTRAMUSCULAR | Status: AC
  Filled 2016-09-03: qty 1000

## 2016-09-03 MED ORDER — CHLORHEXIDINE GLUCONATE 0.12 % MT SOLN
15.0000 mL | Freq: Two times a day (BID) | OROMUCOSAL | Status: DC
  Administered 2016-09-03 – 2016-09-12 (×14): 15 mL via OROMUCOSAL
  Filled 2016-09-03 (×14): qty 15

## 2016-09-03 MED ORDER — ASPIRIN EC 81 MG PO TBEC
81.0000 mg | DELAYED_RELEASE_TABLET | Freq: Every day | ORAL | Status: DC
  Administered 2016-09-04 – 2016-09-08 (×5): 81 mg via ORAL
  Filled 2016-09-03 (×6): qty 1

## 2016-09-03 MED ORDER — ONDANSETRON HCL 4 MG/2ML IJ SOLN: 4.0000 mg | Freq: Four times a day (QID) | INTRAMUSCULAR | Status: DC | PRN

## 2016-09-03 MED ORDER — MIDAZOLAM HCL 2 MG/2ML IJ SOLN
INTRAMUSCULAR | Status: AC
  Filled 2016-09-03: qty 2

## 2016-09-03 MED ORDER — LIDOCAINE HCL (PF) 1 % IJ SOLN
INTRAMUSCULAR | Status: AC
  Filled 2016-09-03: qty 30

## 2016-09-03 MED ORDER — LIDOCAINE HCL (PF) 1 % IJ SOLN
INTRAMUSCULAR | Status: DC | PRN
Start: 2016-09-03 — End: 2016-09-03
  Administered 2016-09-03: 15 mL via INTRADERMAL

## 2016-09-03 MED ORDER — FENTANYL CITRATE (PF) 100 MCG/2ML IJ SOLN
INTRAMUSCULAR | Status: DC | PRN
  Administered 2016-09-03: 25 ug via INTRAVENOUS

## 2016-09-03 MED ORDER — HEPARIN SODIUM (PORCINE) 5000 UNIT/ML IJ SOLN
5000.0000 [IU] | Freq: Three times a day (TID) | INTRAMUSCULAR | Status: DC
  Administered 2016-09-04 – 2016-09-08 (×13): 5000 [IU] via SUBCUTANEOUS
  Filled 2016-09-03 (×14): qty 1

## 2016-09-03 MED ORDER — MIDAZOLAM HCL 2 MG/2ML IJ SOLN
INTRAMUSCULAR | Status: DC | PRN
  Administered 2016-09-03: 2 mg via INTRAVENOUS

## 2016-09-03 MED ORDER — SODIUM CHLORIDE 0.9 % IV SOLN
INTRAVENOUS | Status: DC
  Administered 2016-09-03: 06:00:00 via INTRAVENOUS

## 2016-09-03 MED ORDER — HEPARIN (PORCINE) IN NACL 2-0.9 UNIT/ML-% IJ SOLN
INTRAMUSCULAR | Status: DC | PRN
  Administered 2016-09-03: 500 mL

## 2016-09-03 MED ORDER — SODIUM CHLORIDE 0.9% FLUSH
3.0000 mL | Freq: Two times a day (BID) | INTRAVENOUS | Status: DC
  Administered 2016-09-03: 3 mL via INTRAVENOUS

## 2016-09-03 MED ORDER — SODIUM CHLORIDE 0.9 % IV SOLN
250.0000 mL | INTRAVENOUS | Status: DC | PRN
  Administered 2016-09-04 – 2016-09-06 (×2): 250 mL via INTRAVENOUS

## 2016-09-03 SURGICAL SUPPLY — 7 items
CATH SWAN GANZ 7F STRAIGHT (CATHETERS) ×2 IMPLANT
PACK CARDIAC CATHETERIZATION (CUSTOM PROCEDURE TRAY) ×2 IMPLANT
PROTECTION STATION PRESSURIZED (MISCELLANEOUS) ×4
SHEATH PINNACLE 7F 10CM (SHEATH) ×2 IMPLANT
STATION PROTECTION PRESSURIZED (MISCELLANEOUS) ×2 IMPLANT
TRANSDUCER W/STOPCOCK (MISCELLANEOUS) ×2 IMPLANT
WIRE EMERALD 3MM-J .025X260CM (WIRE) ×2 IMPLANT

## 2016-09-03 NOTE — H&P (View-Only) (Signed)
Advanced Heart Failure Rounding Note  PCP: [facility] Primary Cardiologist: Dr. Haroldine Laws   Subjective:   Ronald Miller. is a 60 y.o. male with history of chronic systolic HF s/p Medtronic ICD 2014, CAD s/p CABG in 2000, HTN, Hx of cocaine abuse, Tobacco abuse, Depression, and COPD.   Has been on milrinone at home since 03/2016. Admitted with 12 lb weight gain over weekend and not responding to adjustment of diuretics.  Last Echo 8/17: LVEF 20% RV moderate to severe HK.  Admitted with recurrent HF.   RHC on 11/22 on milrinone 0.5 showed persistently elevated biventricular pressures. CI was ok.  Swan out 11/25.    CO-OX 672% on milrinone 0.5. Creatinine 1.1.   CVP 4 .   Denies SOB.   CT abdomen--> Mild cirrhotic changes. Spleen normal. No ascites.   [place] 11/22 RA = 19 RV = 55/19 PA = 52/23 (33) PCW = 26 Fick cardiac output/index = 5.1/2.3 Thermo CO/CI = 6.5/2.9 PVR = 1.0 WU Ao sat = 95% PA sat = 55%, 57% RA/PCWP = 0.73  Objective:   Weight Range: 220 lb 8 oz (100 kg) Body mass index is 32.56 kg/m.   Vital Signs:   Temp:  [97.8 F (36.6 C)-98.7 F (37.1 C)] 97.8 F (36.6 C) (11/30 0759) Resp:  [13-19] 13 (11/30 0759) BP: (98-122)/(60-86) 103/60 (11/30 0759) Weight:  [220 lb 8 oz (100 kg)] 220 lb 8 oz (100 kg) (11/30 0406) Last BM Date: 09/01/16 (per pt)  Weight change: Filed Weights   08/31/16 0350 09/01/16 0300 09/02/16 0406  Weight: 215 lb (97.5 kg) 220 lb 9.6 oz (100.1 kg) 220 lb 8 oz (100 kg)    Intake/Output:   Intake/Output Summary (Last 24 hours) at 09/02/16 0941 Last data filed at 09/02/16 0800  Gross per 24 hour  Intake          2165.45 ml  Output             3350 ml  Net         -1184.55 ml     Physical Exam: CVP 4 General: In the bed. NAD.  HEENT: Normal Neck: supple. Carotids 2+ bilat; no bruits. No thyromegaly or nodule noted.  Cor: PMI laterally displaced. RRR. No M/G/R.  Lungs: Decreased but  clear Abdomen: soft, NT, mildly distended, no HSM. No bruits or masses. +BS  Extremities: no cyanosis, clubbing, rash, no edema. RLE anterior aspect 1x1cm thin scab. No erythema.   Neuro: alert & orientedx3, cranial nerves grossly intact. moves all 4 extremities w/o difficulty. Affect pleasant  Telemetry:  NSR with frequent PVCs and V-pacing  Labs: CBC  Recent Labs  09/01/16 0511 09/02/16 0400  WBC 6.6 7.2  HGB 13.4 13.3  HCT 38.6* 39.8  MCV 91.0 91.1  PLT 177 0000000   Basic Metabolic Panel  Recent Labs  08/31/16 0420 09/01/16 0511 09/02/16 0400  NA 134* 134* 131*  K 4.1 4.1 4.1  CL 96* 96* 94*  CO2 29 27 26   GLUCOSE 233* 291* 263*  BUN 20 21* 20  CREATININE 1.23 1.25* 1.13  CALCIUM 9.5 9.2 9.2  MG 1.8 2.0  --    Liver Function Tests No results for input(s): AST, ALT, ALKPHOS, BILITOT, PROT, ALBUMIN in the last 72 hours. No results for input(s): LIPASE, AMYLASE in the last 72 hours. Cardiac Enzymes No results for input(s): CKTOTAL, CKMB, CKMBINDEX, TROPONINI in the last 72 hours.  BNP: BNP (last 3 results)  Recent Labs  03/10/16 0930 03/19/16 1230 08/22/16 1514  BNP 1,369.8* 1,079.6* 1,208.4*    ProBNP (last 3 results) No results for input(s): PROBNP in the last 8760 hours.   D-Dimer No results for input(s): DDIMER in the last 72 hours. Hemoglobin A1C No results for input(s): HGBA1C in the last 72 hours. Fasting Lipid Panel No results for input(s): CHOL, HDL, LDLCALC, TRIG, CHOLHDL, LDLDIRECT in the last 72 hours. Thyroid Function Tests No results for input(s): TSH, T4TOTAL, T3FREE, THYROIDAB in the last 72 hours.  Invalid input(s): FREET3  Other results:     Imaging/Studies:  No results found.  Latest Echo  Latest Cath   Medications:     Scheduled Medications: . aspirin EC  81 mg Oral Daily  . atorvastatin  20 mg Oral QHS  . digoxin  250 mcg Oral Daily  . escitalopram  20 mg Oral Daily  . gabapentin  100 mg Oral BID  . heparin   5,000 Units Subcutaneous Q8H  . insulin aspart  0-15 Units Subcutaneous TID WC  . insulin aspart  0-5 Units Subcutaneous QHS  . insulin aspart  10 Units Subcutaneous TID WC  . insulin glargine  48 Units Subcutaneous QHS  . losartan  12.5 mg Oral BID  . multivitamin with minerals  1 tablet Oral Daily  . nicotine  21 mg Transdermal Q24H  . spironolactone  25 mg Oral Daily  . torsemide  60 mg Oral Daily    Infusions: . milrinone 0.5 mcg/kg/min (09/02/16 0800)    PRN Medications: acetaminophen, albuterol, baclofen, ondansetron (ZOFRAN) IV, senna, sodium chloride flush, technetium TC 64M diethylenetriame-pentaacetic acid, traMADol, zolpidem   Assessment/Plan   1. Acute on Chronic end-stage biventricular Systolic HF LVEF 0000000 due to ICM --> cardiogenic shock - Echo 08/24/16 LVEF 15%, RV mild dilated, moderately reduced.  -NYHA IV symptoms on milrinone 0.5 mcg/kg/min.  - RHC on 11/22 as above -Todays CO-OX is 67%. CVP 4. Continue torsemide 60 mg daily. Renal function stable.  - No BB for now with ongoing low output -Blood type O+. Transplant listing at [facility] put on hold due to ongoing tobacco use and + urine tox screen with cocaine (which he denies)  2. AKI on CKD III -Improved with increased milrinone - Continue to follow closely. Creatinine 1.13  3. CAD - No CP.  --severe 3v- CAD s/p CABG with occluded grafts as above except for LIMA. No targets for revascularization. No CP.  --Continue ASA, statin  4. DMII -Hgb A1C 8.3 on 08/26/2016  - Cover with SSI in house.  5. RLE cellulitis -1.x1 cm thin scab. Completed 8 days of Vancomycin.  6. Tobacco abuse - States he has stopped smoking 1-2 weeks PTA  - Nicotine and Cotinine were consistent with tobacco or tobacco cessation product use 08/20/16. 7. Hypokalemia --improved 8. 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 Normal spleen. No ascites.   He understands he may need home milrinone after LVAD  implant. He understands that is a possibility and he is willing to move forward even if he requires home milrinone.   Palliative Care consult completed. SW evaluated for LVAD.   RV would likely tolerate VAD.  Some concern regarding cirrhosis, but CT scan shows mild cirrhotic changes with normal spleen and no ascites.  He was discussed at [facility] yesterday with tentative plan to offer LVAD.   Per Dr. Hoyt Koch at [facility] transplant program, he is not transplant candidate in the next 6 months until  he is free from tobacco and undergoes substance counselling. He agrees with HM-III VAD as BTT if he is deteriorating.  Plan to [place] tomorrow to assess hemodynamics.   Length of Stay: [place] NP-C  09/02/2016, 9:41 AM  Advanced Heart Failure Team Pager [phone] (M-F; 7a - 4p)  Please contact [facility] for night-coverage after hours (4p -7a ) and weekends on [email or web address]  Patient seen and examined with Darrick Grinder, NP. We discussed all aspects of the encounter. I agree with the assessment and plan as stated above.   He is doing well. Hemodynamics stable. Walking with CR. He is eager to pursue VAD. Will plan RHC tomorrow to reassess. Continue milrinone. Possible VAD next Thursday.  Bensimhon, Daniel,MD 7:02 PM

## 2016-09-03 NOTE — Care Management Important Message (Signed)
Important Message  Patient Details  Name: Ronald Miller. MRN: [id number] Date of Birth: April 18, 1956   Medicare Important Message Given:  Yes    Sophie Tamez Abena 09/03/2016, 1:03 PM

## 2016-09-03 NOTE — Interval H&P Note (Signed)
History and Physical Interval Note:  09/03/2016 4:00 PM  Ronald Miller.  has presented today for surgery, with the diagnosis of chf  The various methods of treatment have been discussed with the patient and family. After consideration of risks, benefits and other options for treatment, the patient has consented to  Procedure(s): Right Heart Cath (N/A) as a surgical intervention .  The patient's history has been reviewed, patient examined, no change in status, stable for surgery.  I have reviewed the patient's chart and labs.  Questions were answered to the patient's satisfaction.     Marshia Tropea, Quillian Quince

## 2016-09-03 NOTE — Progress Notes (Signed)
Advanced Heart Failure Rounding Note  PCP: [facility] Primary Cardiologist: Dr. Haroldine Laws   Subjective:   Ronald Miller. is a 60 y.o. male with history of chronic systolic HF s/p Medtronic ICD 2014, CAD s/p CABG in 2000, HTN, Hx of cocaine abuse, Tobacco abuse, Depression, and COPD.   Has been on milrinone at home since 03/2016. Admitted with 12 lb weight gain over weekend and not responding to adjustment of diuretics.  Last Echo 8/17: LVEF 20% RV moderate to severe HK.  Admitted with recurrent HF.   RHC on 11/22 on milrinone 0.5 showed persistently elevated biventricular pressures. CI was ok.  Swan out 11/25.   Feels ok.  CO-OX 79% on milrinone 0.5. Creatinine 1.1.   CVP 5-6.   Denies SOB.   CT abdomen--> Mild cirrhotic changes. Spleen normal. No ascites.   [place] 11/22 RA = 19 RV = 55/19 PA = 52/23 (33) PCW = 26 Fick cardiac output/index = 5.1/2.3 Thermo CO/CI = 6.5/2.9 PVR = 1.0 WU Ao sat = 95% PA sat = 55%, 57% RA/PCWP = 0.73  Objective:   Weight Range: 100.1 kg (220 lb 9.6 oz) Body mass index is 32.58 kg/m.   Vital Signs:   Temp:  [97.1 F (36.2 C)-97.6 F (36.4 C)] 97.1 F (36.2 C) (12/01 1157) Resp:  [16-18] 16 (12/01 1157) BP: (96-111)/(70-79) 96/79 (12/01 1157) SpO2:  [94 %-95 %] 95 % (12/01 1157) Weight:  [100.1 kg (220 lb 9.6 oz)] 100.1 kg (220 lb 9.6 oz) (12/01 0500) Last BM Date: 09/02/16  Weight change: Filed Weights   09/01/16 0300 09/02/16 0406 09/03/16 0500  Weight: 100.1 kg (220 lb 9.6 oz) 100 kg (220 lb 8 oz) 100.1 kg (220 lb 9.6 oz)    Intake/Output:   Intake/Output Summary (Last 24 hours) at 09/03/16 1600 Last data filed at 09/03/16 1200  Gross per 24 hour  Intake           808.67 ml  Output             2052 ml  Net         -1243.33 ml     Physical Exam: CVP 4 General: In the bed. NAD.  HEENT: Normal Neck: supple. Carotids 2+ bilat; no bruits. No thyromegaly or nodule noted.  Cor: PMI laterally  displaced. RRR. No M/G/R.  Lungs: Decreased but clear Abdomen: soft, NT, mildly distended, no HSM. No bruits or masses. +BS  Extremities: no cyanosis, clubbing, rash, no edema. RLE anterior aspect 1x1cm thin scab. No erythema.   Neuro: alert & orientedx3, cranial nerves grossly intact. moves all 4 extremities w/o difficulty. Affect pleasant  Telemetry:  NSR with frequent PVCs and V-pacing  Labs: CBC  Recent Labs  09/02/16 0400 09/03/16 0425  WBC 7.2 6.1  HGB 13.3 12.7*  HCT 39.8 37.3*  MCV 91.1 90.5  PLT 178 [id number]   Basic Metabolic Panel  Recent Labs  09/01/16 0511 09/02/16 0400 09/03/16 0425  NA 134* 131* 129*  K 4.1 4.1 3.7  CL 96* 94* 92*  CO2 27 26 25   GLUCOSE 291* 263* 269*  BUN 21* 20 20  CREATININE 1.25* 1.13 1.08  CALCIUM 9.2 9.2 8.9  MG 2.0  --  2.1   Liver Function Tests No results for input(s): AST, ALT, ALKPHOS, BILITOT, PROT, ALBUMIN in the last 72 hours. No results for input(s): LIPASE, AMYLASE in the last 72 hours. Cardiac Enzymes No results for input(s): CKTOTAL, CKMB, CKMBINDEX, TROPONINI  in the last 72 hours.  BNP: BNP (last 3 results)  Recent Labs  03/10/16 0930 03/19/16 1230 08/22/16 1514  BNP 1,369.8* 1,079.6* 1,208.4*    ProBNP (last 3 results) No results for input(s): PROBNP in the last 8760 hours.   D-Dimer No results for input(s): DDIMER in the last 72 hours. Hemoglobin A1C No results for input(s): HGBA1C in the last 72 hours. Fasting Lipid Panel No results for input(s): CHOL, HDL, LDLCALC, TRIG, CHOLHDL, LDLDIRECT in the last 72 hours. Thyroid Function Tests No results for input(s): TSH, T4TOTAL, T3FREE, THYROIDAB in the last 72 hours.  Invalid input(s): FREET3  Other results:     Imaging/Studies:  No results found.  Latest Echo  Latest Cath   Medications:     Scheduled Medications: . [START ON 09/04/2016] aspirin EC  81 mg Oral Daily  . atorvastatin  20 mg Oral QHS  . chlorhexidine  15 mL Mouth/Throat  BID  . digoxin  250 mcg Oral Daily  . escitalopram  20 mg Oral Daily  . gabapentin  100 mg Oral BID  . heparin  5,000 Units Subcutaneous Q8H  . insulin aspart  0-15 Units Subcutaneous TID WC  . insulin aspart  0-5 Units Subcutaneous QHS  . insulin aspart  10 Units Subcutaneous TID WC  . insulin glargine  28 Units Subcutaneous BID  . losartan  12.5 mg Oral BID  . multivitamin with minerals  1 tablet Oral Daily  . nicotine  21 mg Transdermal Q24H  . protein supplement shake  2 oz Oral QID  . sodium chloride flush  3 mL Intravenous Q12H  . spironolactone  25 mg Oral Daily  . torsemide  60 mg Oral Daily    Infusions: . sodium chloride 10 mL/hr at 09/03/16 0600  . milrinone 0.5 mcg/kg/min (09/03/16 1326)    PRN Medications: sodium chloride, acetaminophen, albuterol, baclofen, ondansetron (ZOFRAN) IV, senna, sodium chloride flush, sodium chloride flush, technetium TC 63M diethylenetriame-pentaacetic acid, traMADol, zolpidem   Assessment/Plan   1. Acute on Chronic end-stage biventricular Systolic HF LVEF 0000000 due to ICM --> cardiogenic shock - Echo 08/24/16 LVEF 15%, RV mild dilated, moderately reduced.  -NYHA IV symptoms on milrinone 0.5 mcg/kg/min.  - RHC on 11/22 as above -Todays CO-OX is 79%. CVP 5-6. Continue torsemide 60 mg daily. Renal function stable.  - No BB for now with ongoing low output -Blood type O+. Transplant listing at [facility] put on hold due to ongoing tobacco use and + urine tox screen with cocaine (which he denies)  2. AKI on CKD III -Improved with increased milrinone - Continue to follow closely. Creatinine 1.08 3. CAD - No CP.  --severe 3v- CAD s/p CABG with occluded grafts as above except for LIMA. No targets for revascularization. No CP.  --Continue ASA, statin  4. DMII -Hgb A1C 8.3 on 08/26/2016  - Cover with SSI in house.  5. RLE cellulitis -Completed 8 days of Vancomycin.  6. Tobacco abuse - States he has stopped smoking 1-2 weeks PTA  - Nicotine  and Cotinine were consistent with tobacco or tobacco cessation product use 08/20/16. 7. Hypokalemia --improved 8. 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 Normal spleen. No ascites.   He understands he may need home milrinone after LVAD implant. He understands that is a possibility and he is willing to move forward even if he requires home milrinone.   Palliative Care consult completed. SW evaluated for LVAD.   RV would likely tolerate  VAD.  Some concern regarding cirrhosis, but CT scan shows mild cirrhotic changes with normal spleen and no ascites.  He was discussed at [facility] yesterday with tentative plan to offer LVAD.   Per Dr. Hoyt Koch at [facility] transplant program, he is not transplant candidate in the next 6 months until he is free from tobacco and undergoes substance counselling. He agrees with HM-III VAD as BTT if he is deteriorating.  He is doing well. Hemodynamics stable. Walking with CR. He is eager to pursue VAD. Will plan RHC today to reassess. Continue milrinone. Possible VAD next Thursday.     Length of Stay: 12  Glori Bickers MD 09/03/2016, 4:00 PM  Advanced Heart Failure Team Pager [phone] (M-F; [place])  Please contact [facility] for night-coverage after hours (4p -7a ) and weekends on [email or web address]

## 2016-09-03 NOTE — Progress Notes (Signed)
CARDIAC REHAB PHASE I   PRE:  Rate/Rhythm: 85 paced  BP:  Supine: 103/74  Sitting:   Standing:    SaO2: 94%RA  MODE:  Ambulation: 940 ft   POST:  Rate/Rhythm: 107 paced  BP:  Supine:   Sitting: 117/87  Standing:    SaO2: 95%RA 1335-1400 Awaiting cath. Pt walked 940 ft on RA with steady gait. Tolerated well. No complaints. To bathroom and then back to bed.   Graylon Good, RN BSN  09/03/2016 1:56 PM

## 2016-09-03 NOTE — Progress Notes (Signed)
Inpatient Diabetes Program Recommendations  AACE/ADA: New Consensus Statement on Inpatient Glycemic Control (2015)  Target Ranges:  Prepandial:   less than 140 mg/dL      Peak postprandial:   less than 180 mg/dL (1-2 hours)      Critically ill patients:  140 - 180 mg/dL   Lab Results  Component Value Date   GLUCAP 220 (H) 09/02/2016   HGBA1C 8.3 (H) 08/26/2016    Review of Glycemic Control  Diabetes history:DM 2 Outpatient Diabetes medications: Lantus 48, Novolog 10 units TID Current orders for Inpatient glycemic control: Lantus 48 units, Novolog Moderate TID + HS, Novolog 10 units TID meal coverage  Inpatient Diabetes Program Recommendations:   Noted patient planned for surgery next Thursday. Noted elevated fasting CBGs.  Please consider: - increase Lantus to 55 units  Thank you, Bethena Roys E. Ruby Logiudice, RN, MSN, CDE Inpatient Glycemic Control Team Team Pager [phone] (8am-5pm) 09/03/2016 7:30 AM

## 2016-09-04 ENCOUNTER — Inpatient Hospital Stay (HOSPITAL_COMMUNITY): Payer: Medicare HMO

## 2016-09-04 LAB — COMPREHENSIVE METABOLIC PANEL
ALBUMIN: 3.5 g/dL (ref 3.5–5.0)
ALT: 24 U/L (ref 17–63)
AST: 27 U/L (ref 15–41)
Alkaline Phosphatase: 78 U/L (ref 38–126)
Anion gap: 9 (ref 5–15)
BILIRUBIN TOTAL: 0.6 mg/dL (ref 0.3–1.2)
BUN: 18 mg/dL (ref 6–20)
CO2: 27 mmol/L (ref 22–32)
CREATININE: 1.11 mg/dL (ref 0.61–1.24)
Calcium: 9 mg/dL (ref 8.9–10.3)
Chloride: 100 mmol/L — ABNORMAL LOW (ref 101–111)
GFR calc Af Amer: >60 mL/min (ref >60)
GFR calc non Af Amer: >60 mL/min (ref >60)
GLUCOSE: 215 mg/dL — AB (ref 65–99)
POTASSIUM: 4 mmol/L (ref 3.5–5.1)
Sodium: 136 mmol/L (ref 135–145)
TOTAL PROTEIN: 6.9 g/dL (ref 6.5–8.1)

## 2016-09-04 LAB — GLUCOSE, CAPILLARY
GLUCOSE-CAPILLARY: 176 mg/dL — AB (ref 65–99)
GLUCOSE-CAPILLARY: 183 mg/dL — AB (ref 65–99)
Glucose-Capillary: 161 mg/dL — ABNORMAL HIGH (ref 65–99)
Glucose-Capillary: 153 mg/dL — ABNORMAL HIGH (ref 65–99)
Glucose-Capillary: 310 mg/dL — ABNORMAL HIGH (ref 65–99)
Glucose-Capillary: 273 mg/dL — ABNORMAL HIGH (ref 65–99)

## 2016-09-04 LAB — CBC
HEMATOCRIT: 38.3 % — AB (ref 39.0–52.0)
HEMOGLOBIN: 13 g/dL (ref 13.0–17.0)
MCH: 30.9 pg (ref 26.0–34.0)
MCHC: 33.9 g/dL (ref 30.0–36.0)
MCV: 91 fL (ref 78.0–100.0)
Platelets: 171 10*3/uL (ref 150–400)
RBC: 4.21 MIL/uL — AB (ref 4.22–5.81)
RDW: 13.2 % (ref 11.5–15.5)
WBC: 6.2 10*3/uL (ref 4.0–10.5)

## 2016-09-04 LAB — COOXEMETRY PANEL
Carboxyhemoglobin: 2 % — ABNORMAL HIGH (ref 0.5–1.5)
METHEMOGLOBIN: 0.7 % (ref 0.0–1.5)
O2 SAT: 68 %
TOTAL HEMOGLOBIN: 13.2 g/dL (ref 12.0–16.0)

## 2016-09-04 NOTE — Progress Notes (Signed)
Patient ID: Ronald Miller., male   DOB: 12/26/1955, 60 y.o.   MRN: [id number]     Advanced Heart Failure Rounding Note  PCP: [facility] Primary Cardiologist: Dr. Haroldine Laws   Subjective:   Ronald Miller Ronald Miller. is a 60 y.o. male with history of chronic systolic HF s/p Medtronic ICD 2014, CAD s/p CABG in 2000, HTN, Hx of cocaine abuse, Tobacco abuse, Depression, and COPD.   Has been on milrinone at home since 03/2016. Admitted with 12 lb weight gain over weekend and not responding to adjustment of diuretics.  Last Echo 8/17: LVEF 20% RV moderate to severe HK.  Admitted with recurrent HF.   RHC on 11/22 on milrinone 0.5 showed persistently elevated biventricular pressures. CI was ok.  [place] 12/1 much improved.   Swan out 11/25.   Feels ok.  CO-OX 68% on milrinone 0.5. Creatinine 1.1.   CVP 6.   Denies SOB, walked in hall yesterday.   CT abdomen--> Mild cirrhotic changes. Spleen normal. No ascites.   [place] 11/22 RA = 19 RV = 55/19 PA = 52/23 (33) PCW = 26 Fick cardiac output/index = 5.1/2.3 Thermo CO/CI = 6.5/2.9 PVR = 1.0 WU Ao sat = 95% PA sat = 55%, 57% RA/PCWP = 0.73  RHC 12/1 RA = 4  RV = 39/6 PA = 44/15 (30) PCW = 18 Fick cardiac output/index = 6.1/2.9 PVR = 2.0 WU Ao sat = 93% PA sat = 66%  Objective:   Weight Range: 215 lb 3.2 oz (97.6 kg) Body mass index is 31.78 kg/m.   Vital Signs:   Temp:  [97.1 F (36.2 C)-98 F (36.7 C)] 98 F (36.7 C) (12/02 0400) Pulse Rate:  [79-98] 98 (12/02 0400) Resp:  [13-38] 21 (12/02 0500) BP: (96-121)/(63-83) 101/74 (12/02 0000) SpO2:  [92 %-95 %] 94 % (12/02 0400) Weight:  [215 lb 3.2 oz (97.6 kg)] 215 lb 3.2 oz (97.6 kg) (12/02 0400) Last BM Date: 09/02/16  Weight change: Filed Weights   09/02/16 0406 09/03/16 0500 09/04/16 0400  Weight: 220 lb 8 oz (100 kg) 220 lb 9.6 oz (100.1 kg) 215 lb 3.2 oz (97.6 kg)    Intake/Output:   Intake/Output Summary (Last 24 hours) at 09/04/16 [id number] Last data  filed at 09/04/16 0444  Gross per 24 hour  Intake            253.2 ml  Output              900 ml  Net           -646.8 ml     Physical Exam: CVP 6 General: In the chair. NAD.  HEENT: Normal Neck: supple. No JVD.  Cor: PMI laterally displaced. RRR. No M/G/R.  Lungs: Decreased but clear Abdomen: soft, NT, mildly distended, no HSM. No bruits or masses. +BS  Extremities: no cyanosis, clubbing, rash, no edema. RLE anterior aspect 1x1cm thin scab. No erythema.   Neuro: alert & orientedx3, cranial nerves grossly intact. moves all 4 extremities w/o difficulty. Affect pleasant  Telemetry:  NSR with V-pacing  Labs: CBC  Recent Labs  09/03/16 1830 09/04/16 0500  WBC 6.4 6.2  HGB 13.1 13.0  HCT 38.2* 38.3*  MCV 90.7 91.0  PLT 171 [id number]   Basic Metabolic Panel  Recent Labs  09/03/16 0425 09/03/16 1735 09/04/16 0500  NA 129*  --  136  K 3.7  --  4.0  CL 92*  --  100*  CO2 25  --  27  GLUCOSE 269*  --  215*  BUN 20  --  18  CREATININE 1.08 1.07 1.11  CALCIUM 8.9  --  9.0  MG 2.1  --   --    Liver Function Tests  Recent Labs  09/04/16 0500  AST 27  ALT 24  ALKPHOS 78  BILITOT 0.6  PROT 6.9  ALBUMIN 3.5   No results for input(s): LIPASE, AMYLASE in the last 72 hours. Cardiac Enzymes No results for input(s): CKTOTAL, CKMB, CKMBINDEX, TROPONINI in the last 72 hours.  BNP: BNP (last 3 results)  Recent Labs  03/10/16 0930 03/19/16 1230 08/22/16 1514  BNP 1,369.8* 1,079.6* 1,208.4*    ProBNP (last 3 results) No results for input(s): PROBNP in the last 8760 hours.   D-Dimer No results for input(s): DDIMER in the last 72 hours. Hemoglobin A1C No results for input(s): HGBA1C in the last 72 hours. Fasting Lipid Panel No results for input(s): CHOL, HDL, LDLCALC, TRIG, CHOLHDL, LDLDIRECT in the last 72 hours. Thyroid Function Tests No results for input(s): TSH, T4TOTAL, T3FREE, THYROIDAB in the last 72 hours.  Invalid input(s): FREET3  Other  results:     Imaging/Studies:  No results found.  Latest Echo  Latest Cath   Medications:     Scheduled Medications: . aspirin EC  81 mg Oral Daily  . atorvastatin  20 mg Oral QHS  . chlorhexidine  15 mL Mouth/Throat BID  . digoxin  250 mcg Oral Daily  . escitalopram  20 mg Oral Daily  . gabapentin  100 mg Oral BID  . heparin  5,000 Units Subcutaneous Q8H  . insulin aspart  0-15 Units Subcutaneous TID WC  . insulin aspart  0-5 Units Subcutaneous QHS  . insulin aspart  10 Units Subcutaneous TID WC  . insulin glargine  28 Units Subcutaneous BID  . losartan  12.5 mg Oral BID  . multivitamin with minerals  1 tablet Oral Daily  . nicotine  21 mg Transdermal Q24H  . protein supplement shake  2 oz Oral QID  . sodium chloride flush  3 mL Intravenous Q12H  . sodium chloride flush  3 mL Intravenous Q12H  . spironolactone  25 mg Oral Daily  . torsemide  60 mg Oral Daily    Infusions: . milrinone 0.5 mcg/kg/min (09/04/16 0649)    PRN Medications: sodium chloride, sodium chloride, acetaminophen, albuterol, baclofen, ondansetron (ZOFRAN) IV, senna, sodium chloride flush, sodium chloride flush, sodium chloride flush, technetium TC 49M diethylenetriame-pentaacetic acid, traMADol, zolpidem   Assessment/Plan   1. 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  Echo 08/24/16 LVEF 15%, RV mild dilated, moderately reduced.  NYHA IV symptoms on milrinone 0.5 mcg/kg/min. [place] on 11/22 as above.  With diuresis, much improved on RHC 12/1. Today,s CO-OX is 68% with CVP 6.  - Continue torsemide 60 mg daily. Renal function stable.  - No BB for now with ongoing low output - Continue digoxin, losartan, spironolactone.  Check digoxin level in am.  - Blood type O+. Transplant listing at [facility] put on hold due to ongoing tobacco use and + urine tox screen with cocaine (which he denies).  We have been discussing LVAD>  He understands he may need home  milrinone after LVAD implant. He understands that is a possibility and he is willing to move forward even if he requires home milrinone. Palliative Care consult completed. SW evaluated for LVAD. RV would likely tolerate VAD.  Some concern  regarding cirrhosis, but CT scan shows mild cirrhotic changes with normal spleen and no ascites.  He was discussed at [facility] with tentative plan to offer LVAD => possibly next Thursday. Heartmate 3 as BTT. Will stay in hospital until LVAD.  2. AKI on CKD III: Improved with increased milrinone - Continue to follow closely. Creatinine 1.1 3. CAD: No CP. 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 No targets for revascularization. No CP.  - Continue ASA, statin  4. DMII: Hgb A1C 8.3 on 08/26/2016  - Cover with SSI in house.  5. RLE cellulitis: Completed 8 days of Vancomycin.  6. Tobacco abuse: States he has stopped smoking 1-2 weeks PTA  - Nicotine and Cotinine were consistent with tobacco or tobacco cessation product use 08/20/16. 7. Hypokalemia: improved 8. 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 Normal spleen. No ascites.     Length of Stay: 13  Loralie Champagne MD 09/04/2016, 7:12 AM  Advanced Heart Failure Team Pager [phone] (M-F; [place])  Please contact [facility] for night-coverage after hours (4p -7a ) and weekends on [email or web address]

## 2016-09-04 NOTE — Progress Notes (Signed)
Patient allowed RN to draw labs, give meds, and patient went down for chest CT and X-ray. Patient stated he was sorry for his conduct and he is starving. Crackers and peanut butter offered and patient ate those. Diet changed to Cardiac Healthy, Carb Modified. Patient ordering his breakfast.

## 2016-09-04 NOTE — Progress Notes (Signed)
Patient refused to have a 0400 CVP check done, refusing lab work, refusing any further medications. Will continue to monitor as much as possible.

## 2016-09-05 LAB — COOXEMETRY PANEL
Carboxyhemoglobin: 1.4 % (ref 0.5–1.5)
METHEMOGLOBIN: 1 % (ref 0.0–1.5)
O2 SAT: 64.6 %
TOTAL HEMOGLOBIN: 12.9 g/dL (ref 12.0–16.0)

## 2016-09-05 LAB — DIGOXIN LEVEL: Digoxin Level: 0.5 ng/mL — ABNORMAL LOW (ref 0.8–2.0)

## 2016-09-05 LAB — CBC
HCT: 36.7 % — ABNORMAL LOW (ref 39.0–52.0)
Hemoglobin: 12.6 g/dL — ABNORMAL LOW (ref 13.0–17.0)
MCH: 31.1 pg (ref 26.0–34.0)
MCHC: 34.3 g/dL (ref 30.0–36.0)
MCV: 90.6 fL (ref 78.0–100.0)
PLATELETS: 171 10*3/uL (ref 150–400)
RBC: 4.05 MIL/uL — ABNORMAL LOW (ref 4.22–5.81)
RDW: 13 % (ref 11.5–15.5)
WBC: 6.5 10*3/uL (ref 4.0–10.5)

## 2016-09-05 LAB — GLUCOSE, CAPILLARY
GLUCOSE-CAPILLARY: 192 mg/dL — AB (ref 65–99)
GLUCOSE-CAPILLARY: 268 mg/dL — AB (ref 65–99)
Glucose-Capillary: 139 mg/dL — ABNORMAL HIGH (ref 65–99)
Glucose-Capillary: 317 mg/dL — ABNORMAL HIGH (ref 65–99)

## 2016-09-05 LAB — BASIC METABOLIC PANEL
ANION GAP: 9 (ref 5–15)
BUN: 20 mg/dL (ref 6–20)
CALCIUM: 9.1 mg/dL (ref 8.9–10.3)
CO2: 29 mmol/L (ref 22–32)
CREATININE: 1.17 mg/dL (ref 0.61–1.24)
Chloride: 95 mmol/L — ABNORMAL LOW (ref 101–111)
GLUCOSE: 255 mg/dL — AB (ref 65–99)
Potassium: 4 mmol/L (ref 3.5–5.1)
Sodium: 133 mmol/L — ABNORMAL LOW (ref 135–145)

## 2016-09-05 NOTE — Progress Notes (Signed)
Patient ID: Ronald Miller., male   DOB: [birth year], 60 y.o.   MRN: [id number]     Advanced Heart Failure Rounding Note  PCP: [facility] Primary Cardiologist: Dr. Haroldine Laws   Subjective:   Ronald Miller. is a 60 y.o. male with history of chronic systolic HF s/p Medtronic ICD 2014, CAD s/p CABG in 2000, HTN, Hx of cocaine abuse, Tobacco abuse, Depression, and COPD.   Has been on milrinone at home since 03/2016. Admitted with 12 lb weight gain over weekend and not responding to adjustment of diuretics.  Last Echo 8/17: LVEF 20% RV moderate to severe HK.  Admitted with recurrent HF.   RHC on 11/22 on milrinone 0.5 showed persistently elevated biventricular pressures. CI was ok.  [place] 12/1 much improved.   [place]wan out 11/25.   Feels ok.  CO-OX 65% on milrinone 0.5. Creatinine 1.1.   CVP 5.   Denies SOB, did not get to walk yesterday.   CT abdomen--> Mild cirrhotic changes. Spleen normal. No ascites.   [place] 11/22 RA = 19 RV = 55/19 PA = 52/23 (33) PCW = 26 Fick cardiac output/index = 5.1/2.3 Thermo CO/CI = 6.5/2.9 PVR = 1.0 WU Ao sat = 95% PA sat = 55%, 57% RA/PCWP = 0.73  RHC 12/1 RA = 4  RV = 39/6 PA = 44/15 (30) PCW = 18 Fick cardiac output/index = 6.1/2.9 PVR = 2.0 WU Ao sat = 93% PA sat = 66%  Objective:   Weight Range: 219 lb 4.8 oz (99.5 kg) Body mass index is 32.38 kg/m.   Vital Signs:   Temp:  [97.3 F (36.3 C)-99 F (37.2 C)] 98.4 F (36.9 C) (12/03 0739) Pulse Rate:  [78-94] 86 (12/03 0739) Resp:  [17-21] 20 (12/03 0739) BP: (95-114)/(56-77) 104/75 (12/03 0739) SpO2:  [92 %-99 %] 95 % (12/03 0739) Weight:  [219 lb 4.8 oz (99.5 kg)] 219 lb 4.8 oz (99.5 kg) (12/03 0403) Last BM Date: 09/02/16  Weight change: Filed Weights   09/03/16 0500 09/04/16 0400 09/05/16 0403  Weight: 220 lb 9.6 oz (100.1 kg) 215 lb 3.2 oz (97.6 kg) 219 lb 4.8 oz (99.5 kg)    Intake/Output:   Intake/Output Summary (Last 24 hours) at 09/05/16  0806 Last data filed at 09/05/16 0600  Gross per 24 hour  Intake              664 ml  Output             1925 ml  Net            -1261 ml     Physical Exam: CVP 5 General: In the chair. NAD.  HEENT: Normal Neck: supple. No JVD.  Cor: PMI laterally displaced. RRR. No M/G/R.  Lungs: Decreased but clear Abdomen: soft, NT, mildly distended, no HSM. No bruits or masses. +BS  Extremities: no cyanosis, clubbing, rash, no edema.  Neuro: alert & orientedx3, cranial nerves grossly intact. moves all 4 extremities w/o difficulty. Affect pleasant  Telemetry:  NSR with V-pacing  Labs: CBC  Recent Labs  09/03/16 1830 09/04/16 0500  WBC 6.4 6.2  HGB 13.1 13.0  HCT 38.2* 38.3*  MCV 90.7 91.0  PLT 171 [id number]   Basic Metabolic Panel  Recent Labs  09/03/16 0425  09/04/16 0500 09/05/16 0430  NA 129*  --  136 133*  K 3.7  --  4.0 4.0  CL 92*  --  100* 95*  CO2 25  --  27 29  GLUCOSE 269*  --  215* 255*  BUN 20  --  18 20  CREATININE 1.08  < > 1.11 1.17  CALCIUM 8.9  --  9.0 9.1  MG 2.1  --   --   --   < > = values in this interval not displayed. Liver Function Tests  Recent Labs  09/04/16 0500  AST 27  ALT 24  ALKPHOS 78  BILITOT 0.6  PROT 6.9  ALBUMIN 3.5   No results for input(s): LIPASE, AMYLASE in the last 72 hours. Cardiac Enzymes No results for input(s): CKTOTAL, CKMB, CKMBINDEX, TROPONINI in the last 72 hours.  BNP: BNP (last 3 results)  Recent Labs  03/10/16 0930 03/19/16 1230 08/22/16 1514  BNP 1,369.8* 1,079.6* 1,208.4*    ProBNP (last 3 results) No results for input(s): PROBNP in the last 8760 hours.   D-Dimer No results for input(s): DDIMER in the last 72 hours. Hemoglobin A1C No results for input(s): HGBA1C in the last 72 hours. Fasting Lipid Panel No results for input(s): CHOL, HDL, LDLCALC, TRIG, CHOLHDL, LDLDIRECT in the last 72 hours. Thyroid Function Tests No results for input(s): TSH, T4TOTAL, T3FREE, THYROIDAB in the last 72  hours.  Invalid input(s): FREET3  Other results:     Imaging/Studies:  Dg Chest 2 View  Result Date: 09/04/2016 CLINICAL DATA:  Shortness of breath EXAM: CHEST  2 VIEW COMPARISON:  August 22, 2016 FINDINGS: Stable AICD device. Stable fractured sternotomy wires. Stable right PICC line and cardiomegaly. Mild vascular crowding in the medial right lung base with no opacity in this region on the CT scan performed at the same time. No other interval changes or acute abnormalities. IMPRESSION: No significant interval change or acute abnormality. Electronically Signed   By: Dorise Bullion III M.D   On: 09/04/2016 08:07   Ct Chest Wo Contrast  Result Date: 09/04/2016 CLINICAL DATA:  CHF.  Defibrillator placement. EXAM: CT CHEST WITHOUT CONTRAST TECHNIQUE: Multidetector CT imaging of the chest was performed following the standard protocol without IV contrast. COMPARISON:  Multiple recent chest x-rays and CT scan October 29, 2015 FINDINGS: Cardiovascular: The heart is stable. Coronary artery calcifications identified. The 4 lead pacemaker is in stable position. The central great vessels are stable. The ascending thoracic aorta measures 4.2 cm which is mildly prominent but unchanged. The central pulmonary arteries are stable and unremarkable. Mediastinum/Nodes: Shotty nodes in the mediastinum are stable, likely reactive. No other adenopathy in the chest. No effusions. The esophagus is unremarkable. Lungs/Pleura: Probable mucous posteriorly in the trachea. Central airways are otherwise normal. No pneumothorax. No suspicious pulmonary nodules, masses, or focal infiltrates. 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 Previous cholecystectomy. No other abnormalities on limited views the upper abdomen. Musculoskeletal: Fractured sternotomy wires again identified. No other bony changes. IMPRESSION: 1. No acute interval change. Pacemaker wires are in stable position. Shotty reactive nodes in the  mediastinum are stable. Electronically Signed   By: Dorise Bullion III M.D   On: 09/04/2016 07:51    Latest Echo  Latest Cath   Medications:     Scheduled Medications: . aspirin EC  81 mg Oral Daily  . atorvastatin  20 mg Oral QHS  . chlorhexidine  15 mL Mouth/Throat BID  . digoxin  250 mcg Oral Daily  . escitalopram  20 mg Oral Daily  . gabapentin  100 mg Oral BID  . heparin  5,000 Units Subcutaneous Q8H  . insulin aspart  0-15 Units Subcutaneous  TID WC  . insulin aspart  0-5 Units Subcutaneous QHS  . insulin aspart  10 Units Subcutaneous TID WC  . insulin glargine  28 Units Subcutaneous BID  . losartan  12.5 mg Oral BID  . multivitamin with minerals  1 tablet Oral Daily  . nicotine  21 mg Transdermal Q24H  . protein supplement shake  2 oz Oral QID  . sodium chloride flush  3 mL Intravenous Q12H  . sodium chloride flush  3 mL Intravenous Q12H  . spironolactone  25 mg Oral Daily  . torsemide  60 mg Oral Daily    Infusions: . milrinone 0.5 mcg/kg/min (09/05/16 0618)    PRN Medications: sodium chloride, sodium chloride, acetaminophen, albuterol, baclofen, ondansetron (ZOFRAN) IV, senna, sodium chloride flush, sodium chloride flush, sodium chloride flush, technetium TC 85M diethylenetriame-pentaacetic acid, traMADol, zolpidem   Assessment/Plan   1. 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  Echo 08/24/16 LVEF 15%, RV mild dilated, moderately reduced.  NYHA IV symptoms on milrinone 0.5 mcg/kg/min. [place] on 11/22 as above.  With diuresis, much improved on RHC 12/1. Today's CO-OX is 63% with CVP 5.  - Continue torsemide 60 mg daily. Renal function stable.  - No BB for now with ongoing low output - Continue digoxin, losartan, spironolactone.  Digoxin level ok today.  - Blood type O+. Transplant listing at [facility] put on hold due to ongoing tobacco use and + urine tox screen with cocaine (which he denies).  We have been  discussing LVAD>  He understands he may need home milrinone after LVAD implant. He understands that is a possibility and he is willing to move forward even if he requires home milrinone. Palliative Care consult completed. SW evaluated for LVAD. RV would likely tolerate VAD.  Some concern regarding cirrhosis, but CT scan shows mild cirrhotic changes with normal spleen and no ascites.  He was discussed at [facility] with tentative plan to offer LVAD => possibly next Thursday. Heartmate 3 as BTT. Will stay in hospital until LVAD.  2. AKI on CKD III: Improved with increased milrinone - Continue to follow closely. Creatinine 1.1 3. CAD: No CP. 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 No targets for revascularization. No CP.  - Continue ASA, statin  4. DMII: Hgb A1C 8.3 on 08/26/2016  - Cover with SSI in house.  5. RLE cellulitis: Completed 8 days of Vancomycin.  6. Tobacco abuse: States he has stopped smoking 1-2 weeks PTA  - Nicotine and Cotinine were consistent with tobacco or tobacco cessation product use 08/20/16. 7. Hypokalemia: improved 8. 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 Normal spleen. No ascites.     Length of Stay: 14  Loralie Champagne MD 09/05/2016, 8:06 AM  Advanced Heart Failure Team Pager [phone] (M-F; 7a - 4p)  Please contact [facility] for night-coverage after hours (4p -7a ) and weekends on [email or web address]

## 2016-09-05 NOTE — Progress Notes (Signed)
Patient stated he no longer needed Nicotine patches. Not given to patient tonight. Returned to [employer]. Old patch removed.

## 2016-09-06 ENCOUNTER — Encounter (HOSPITAL_COMMUNITY): Payer: Self-pay | Admitting: Internal Medicine

## 2016-09-06 LAB — BASIC METABOLIC PANEL
Anion gap: 6 (ref 5–15)
BUN: 21 mg/dL — AB (ref 6–20)
CHLORIDE: 97 mmol/L — AB (ref 101–111)
CO2: 29 mmol/L (ref 22–32)
CREATININE: 1.09 mg/dL (ref 0.61–1.24)
Calcium: 8.4 mg/dL — ABNORMAL LOW (ref 8.9–10.3)
GFR calc Af Amer: >60 mL/min (ref >60)
GLUCOSE: 220 mg/dL — AB (ref 65–99)
POTASSIUM: 3.8 mmol/L (ref 3.5–5.1)
SODIUM: 132 mmol/L — AB (ref 135–145)

## 2016-09-06 LAB — GLUCOSE, CAPILLARY
Glucose-Capillary: 188 mg/dL — ABNORMAL HIGH (ref 65–99)
Glucose-Capillary: 201 mg/dL — ABNORMAL HIGH (ref 65–99)
Glucose-Capillary: 181 mg/dL — ABNORMAL HIGH (ref 65–99)
Glucose-Capillary: 286 mg/dL — ABNORMAL HIGH (ref 65–99)

## 2016-09-06 LAB — CBC
HCT: 37.1 % — ABNORMAL LOW (ref 39.0–52.0)
Hemoglobin: 12.7 g/dL — ABNORMAL LOW (ref 13.0–17.0)
MCH: 30.8 pg (ref 26.0–34.0)
MCHC: 34.2 g/dL (ref 30.0–36.0)
MCV: 89.8 fL (ref 78.0–100.0)
PLATELETS: 166 10*3/uL (ref 150–400)
RBC: 4.13 MIL/uL — AB (ref 4.22–5.81)
RDW: 12.8 % (ref 11.5–15.5)
WBC: 5.5 10*3/uL (ref 4.0–10.5)

## 2016-09-06 LAB — COOXEMETRY PANEL
CARBOXYHEMOGLOBIN: 1.8 % — AB (ref 0.5–1.5)
Methemoglobin: 0.7 % (ref 0.0–1.5)
O2 SAT: 66.6 %
TOTAL HEMOGLOBIN: 13.2 g/dL (ref 12.0–16.0)

## 2016-09-06 NOTE — Progress Notes (Signed)
CSW met at bedside with patient along with HF Navigator and VAD Coordinator. Patient spoke about his support network and pending VAD implant. CSW offered supportive intervention and will continue to follow for support pending VAD implant. Raquel Sarna, LCSW [phone]

## 2016-09-06 NOTE — Progress Notes (Signed)
Patient ID: Ronald Fecko., male   DOB: Mar 02, 1956, 60 y.o.   MRN: [id number]     Advanced Heart Failure Rounding Note  PCP: [facility] Primary Cardiologist: Dr. Haroldine Laws   Subjective:   Ronald Miller. is a 60 y.o. male with history of chronic systolic HF s/p Medtronic ICD 2014, CAD s/p CABG in 2000, HTN, Hx of cocaine abuse, Tobacco abuse, Depression, and COPD.   Has been on milrinone at home since 03/2016. Admitted with 12 lb weight gain over weekend and not responding to adjustment of diuretics.  Last Echo 8/17: LVEF 20% RV moderate to severe HK.  Admitted with recurrent HF.   RHC on 11/22 on milrinone 0.5 showed persistently elevated biventricular pressures. CI was ok.  [place] 12/1 much improved.   Swan out 11/25.   CO-OX 66.6% on milrinone 0.5. Creatinine 1.09. CVP ~4  Feeling OK this morning. Has had a mild headache yesterday and today. No SOB or CP.   CT abdomen--> Mild cirrhotic changes. Spleen normal. No ascites.   [place] 11/22 RA = 19 RV = 55/19 PA = 52/23 (33) PCW = 26 Fick cardiac output/index = 5.1/2.3 Thermo CO/CI = 6.5/2.9 PVR = 1.0 WU Ao sat = 95% PA sat = 55%, 57% RA/PCWP = 0.73  RHC 12/1 RA = 4  RV = 39/6 PA = 44/15 (30) PCW = 18 Fick cardiac output/index = 6.1/2.9 PVR = 2.0 WU Ao sat = 93% PA sat = 66%  Objective:   Weight Range: 220 lb 7.4 oz (100 kg) Body mass index is 32.56 kg/m.   Vital Signs:   Temp:  [97.3 F (36.3 C)-98.7 F (37.1 C)] 98 F (36.7 C) (12/04 0418) Pulse Rate:  [86-97] 93 (12/04 0418) Resp:  [20] 20 (12/03 0739) BP: (103-112)/(60-78) 103/71 (12/04 0418) SpO2:  [92 %-97 %] 92 % (12/04 0418) Weight:  [220 lb 7.4 oz (100 kg)] 220 lb 7.4 oz (100 kg) (12/04 0418) Last BM Date: 09/04/16  Weight change: Filed Weights   09/04/16 0400 09/05/16 0403 09/06/16 0418  Weight: 215 lb 3.2 oz (97.6 kg) 219 lb 4.8 oz (99.5 kg) 220 lb 7.4 oz (100 kg)    Intake/Output:   Intake/Output Summary (Last 24  hours) at 09/06/16 0730 Last data filed at 09/06/16 0600  Gross per 24 hour  Intake          1491.27 ml  Output             5675 ml  Net         -4183.73 ml     Physical Exam: CVP 4 General: Seated in chair. NAD HEENT: Normal Neck: supple. JVP not elevated.  Cor: PMI laterally displaced. RRR. No M/G/R.  Lungs: CTAB, normal effort Abdomen: soft, NT, ND, no HSM. No bruits or masses. +BS  Extremities: no cyanosis, clubbing, rash. No peripheral edema. Neuro: alert & orientedx3, cranial nerves grossly intact. moves all 4 extremities w/o difficulty. Affect pleasant  Telemetry: Reviewed, NSR with V-pacing.  Labs: CBC  Recent Labs  09/05/16 0430 09/06/16 0450  WBC 6.5 5.5  HGB 12.6* 12.7*  HCT 36.7* 37.1*  MCV 90.6 89.8  PLT 171 [id number]   Basic Metabolic Panel  Recent Labs  09/05/16 0430 09/06/16 0450  NA 133* 132*  K 4.0 3.8  CL 95* 97*  CO2 29 29  GLUCOSE 255* 220*  BUN 20 21*  CREATININE 1.17 1.09  CALCIUM 9.1 8.4*   Liver Function Tests  Recent Labs  09/04/16 0500  AST 27  ALT 24  ALKPHOS 78  BILITOT 0.6  PROT 6.9  ALBUMIN 3.5   No results for input(s): LIPASE, AMYLASE in the last 72 hours. Cardiac Enzymes No results for input(s): CKTOTAL, CKMB, CKMBINDEX, TROPONINI in the last 72 hours.  BNP: BNP (last 3 results)  Recent Labs  03/10/16 0930 03/19/16 1230 08/22/16 1514  BNP 1,369.8* 1,079.6* 1,208.4*    ProBNP (last 3 results) No results for input(s): PROBNP in the last 8760 hours.   D-Dimer No results for input(s): DDIMER in the last 72 hours. Hemoglobin A1C No results for input(s): HGBA1C in the last 72 hours. Fasting Lipid Panel No results for input(s): CHOL, HDL, LDLCALC, TRIG, CHOLHDL, LDLDIRECT in the last 72 hours. Thyroid Function Tests No results for input(s): TSH, T4TOTAL, T3FREE, THYROIDAB in the last 72 hours.  Invalid input(s): FREET3  Other results:     Imaging/Studies:  No results found.  Latest Echo  Latest  Cath   Medications:     Scheduled Medications: . aspirin EC  81 mg Oral Daily  . atorvastatin  20 mg Oral QHS  . chlorhexidine  15 mL Mouth/Throat BID  . digoxin  250 mcg Oral Daily  . escitalopram  20 mg Oral Daily  . gabapentin  100 mg Oral BID  . heparin  5,000 Units Subcutaneous Q8H  . insulin aspart  0-15 Units Subcutaneous TID WC  . insulin aspart  0-5 Units Subcutaneous QHS  . insulin aspart  10 Units Subcutaneous TID WC  . insulin glargine  28 Units Subcutaneous BID  . losartan  12.5 mg Oral BID  . multivitamin with minerals  1 tablet Oral Daily  . nicotine  21 mg Transdermal Q24H  . protein supplement shake  2 oz Oral QID  . sodium chloride flush  3 mL Intravenous Q12H  . sodium chloride flush  3 mL Intravenous Q12H  . spironolactone  25 mg Oral Daily  . torsemide  60 mg Oral Daily    Infusions: . milrinone 0.5 mcg/kg/min (09/06/16 0600)    PRN Medications: sodium chloride, sodium chloride, acetaminophen, albuterol, baclofen, ondansetron (ZOFRAN) IV, senna, sodium chloride flush, sodium chloride flush, sodium chloride flush, technetium TC 19M diethylenetriame-pentaacetic acid, traMADol, zolpidem   Assessment/Plan   1. 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  Echo 08/24/16 LVEF 15%, RV mild dilated, moderately reduced.  [place] IV symptoms on milrinone 0.5 mcg/kg/min. [place] on 11/22 as above.  With diuresis, much improved on RHC 12/1. Today's CO-OX is 66.6% with CVP 4 - Continue torsemide 60 mg daily. Renal function stable.  - No BB for now with ongoing low output - Continue digoxin, losartan, spironolactone.  Digoxin level ok 09/05/16.  - Blood type O+. Transplant listing at [facility] put on hold due to ongoing tobacco use and + urine tox screen with cocaine (which he denies).  We have been discussing LVAD>  He understands he may need home milrinone after LVAD implant. He understands that is a possibility and he is  willing to move forward even if he requires home milrinone. Palliative Care consult completed. SW evaluated for LVAD. RV would likely tolerate VAD.  Some concern regarding cirrhosis, but CT scan shows mild cirrhotic changes with normal spleen and no ascites.   - He was discussed at [facility] with tentative plan to offer LVAD => possibly Thursday. Heartmate 3 as BTT. Will stay in hospital until LVAD.  2.  AKI on CKD III: - Improved and stable with milrinone.  - Continue to follow closely. Creatinine 1.09 3. CAD: No CP. 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 No targets for revascularization. No CP.  - Continue ASA, statin  4. DMII: Hgb A1C 8.3 on 08/26/2016  - Cover with SSI in house.  5. RLE cellulitis: Completed 8 days of Vancomycin.  6. Tobacco abuse: States he has stopped smoking 1-2 weeks PTA  - Nicotine and Cotinine were consistent with tobacco or tobacco cessation product use 08/20/16. 7. Hypokalemia:  - Stable. 8. 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 Normal spleen. No ascites.     No pressing issues at this time.  Continue med optimization leading up to LVAD implant. Likely this week.   Length of Stay: [place], [place] 09/06/2016, 7:30 AM  Advanced Heart Failure Team Pager [phone] (M-F; 7a - 4p)  Please contact [facility] for night-coverage after hours (4p -7a ) and weekends on [email or web address]   Patient seen and examined with Oda Kilts, PA-C. We discussed all aspects of the encounter. I agree with the assessment and plan as stated above.   Hemodynamics and labs reviewed. Patient currently optimized on high docs milrinone. Plan for LVAD placement on Thursday.   Bensimhon, Daniel,MD 8:40 AM

## 2016-09-06 NOTE — Progress Notes (Signed)
Inpatient Diabetes Program Recommendations  AACE/ADA: New Consensus Statement on Inpatient Glycemic Control (2015)  Target Ranges:  Prepandial:   less than 140 mg/dL      Peak postprandial:   less than 180 mg/dL (1-2 hours)      Critically ill patients:  140 - 180 mg/dL   Lab Results  Component Value Date   GLUCAP 201 (H) 09/06/2016   HGBA1C 8.3 (H) 08/26/2016   Results for ASANTE, BURGY (MRN [id number]) as of 09/06/2016 13:05  Ref. Range 09/05/2016 11:51 09/05/2016 15:56 09/05/2016 21:02 09/06/2016 08:14 09/06/2016 11:49  Glucose-Capillary Latest Ref Range: 65 - 99 mg/dL 268 (H) 139 (H) 317 (H) 188 (H) 201 (H)   Review of Glycemic Control  Diabetes history:       DM2, end stage HF, AKI on CKD III, Cirrhosis Outpatient Diabetes medications:       Lantus 48 units QHS, Novolog 10 units TIDAC Current orders for Inpatient glycemic control:       Lantus 28 units BID,       Novolog 0-15 units TIDAC and 0-5 units QHS correction sliding scale,       Novolog 10 units TIDAC meal coverage.  Inpatient Diabetes Program Recommendations, please consider :     Increasing basal insulin to Lantus 30-32 units BID     Increasing Meal coverage to Novolog 12 units TIDAC if patient continues to eat > 50% of meals. Thank you,  Windy Carina, RN, BSN Diabetes Coordinator Inpatient Diabetes Program [phone] (Team Pager)

## 2016-09-06 NOTE — Progress Notes (Addendum)
CARDIAC REHAB PHASE I   PRE:  Rate/Rhythm: 88 paced  BP:  Sitting: 111/94        SaO2: 97 RA  MODE:  Ambulation: 1880 ft, 940 ft in 6 minutes   POST:  Rate/Rhythm: 108 paced  BP:  Sitting: 119/70         SaO2: 96 RA  Pt ambulated 1880 ft on RA, IV, assist x1, steady gait, tolerated well with no complaints. Pt completed 6 minute walk test.  Pt ambulated 940 ft in 6 minutes, no rest breaks. Pt appreciative of walk. Pt to recliner after walk, feet elevated, call bell within reach. Will follow.   [id number] Lenna Sciara, RN, BSN 09/06/2016 3:26 PM

## 2016-09-07 ENCOUNTER — Encounter (HOSPITAL_COMMUNITY): Payer: Self-pay | Admitting: Certified Registered Nurse Anesthetist

## 2016-09-07 LAB — CBC
HCT: 36 % — ABNORMAL LOW (ref 39.0–52.0)
HEMOGLOBIN: 12.4 g/dL — AB (ref 13.0–17.0)
MCH: 30.9 pg (ref 26.0–34.0)
MCHC: 34.4 g/dL (ref 30.0–36.0)
MCV: 89.8 fL (ref 78.0–100.0)
PLATELETS: 160 10*3/uL (ref 150–400)
RBC: 4.01 MIL/uL — ABNORMAL LOW (ref 4.22–5.81)
RDW: 13.1 % (ref 11.5–15.5)
WBC: 6 10*3/uL (ref 4.0–10.5)

## 2016-09-07 LAB — BASIC METABOLIC PANEL
Anion gap: 7 (ref 5–15)
BUN: 19 mg/dL (ref 6–20)
CALCIUM: 8.8 mg/dL — AB (ref 8.9–10.3)
CHLORIDE: 93 mmol/L — AB (ref 101–111)
CO2: 30 mmol/L (ref 22–32)
CREATININE: 1.05 mg/dL (ref 0.61–1.24)
Glucose, Bld: 259 mg/dL — ABNORMAL HIGH (ref 65–99)
Potassium: 3.5 mmol/L (ref 3.5–5.1)
SODIUM: 130 mmol/L — AB (ref 135–145)

## 2016-09-07 LAB — GLUCOSE, CAPILLARY
GLUCOSE-CAPILLARY: 144 mg/dL — AB (ref 65–99)
GLUCOSE-CAPILLARY: 223 mg/dL — AB (ref 65–99)
Glucose-Capillary: 125 mg/dL — ABNORMAL HIGH (ref 65–99)
Glucose-Capillary: 179 mg/dL — ABNORMAL HIGH (ref 65–99)

## 2016-09-07 LAB — COOXEMETRY PANEL
CARBOXYHEMOGLOBIN: 1.3 % (ref 0.5–1.5)
METHEMOGLOBIN: 0.9 % (ref 0.0–1.5)
O2 SAT: 58.9 %
TOTAL HEMOGLOBIN: 12.9 g/dL (ref 12.0–16.0)

## 2016-09-07 MED ORDER — MELATONIN 3 MG PO TABS
3.0000 mg | ORAL_TABLET | Freq: Every evening | ORAL | Status: DC | PRN
  Administered 2016-09-14 – 2016-09-22 (×2): 3 mg via ORAL
  Filled 2016-09-07 (×5): qty 1

## 2016-09-07 MED ORDER — POTASSIUM CHLORIDE CRYS ER 20 MEQ PO TBCR
40.0000 meq | EXTENDED_RELEASE_TABLET | Freq: Once | ORAL | Status: AC
  Administered 2016-09-07: 40 meq via ORAL
  Filled 2016-09-07: qty 2

## 2016-09-07 NOTE — Progress Notes (Signed)
CARDIAC REHAB PHASE I   PRE:  Rate/Rhythm: pacing 94  BP:  Supine:   Sitting: 100/57  Standing:    SaO2: 95%RA  MODE:  Ambulation: 1880 ft   POST:  Rate/Rhythm: pacing 120  BP:  Supine:   Sitting: 103/72  Standing:    SaO2: 95%RA 1430-1500 Pt walked 1880 ft on RA with steady gait. Tolerated well. No DOE. To recliner after walk.   Graylon Good, RN BSN  09/07/2016 2:55 PM

## 2016-09-07 NOTE — Progress Notes (Signed)
Nutrition Follow-up  DOCUMENTATION CODES:   Obesity unspecified  INTERVENTION:   -Continue MVI daily -Provided reinforcement on carb modified, heart healthy diet -D/c Premier Protein supplement, due to poor acceptance  NUTRITION DIAGNOSIS:   Altered nutrition lab value related to chronic illness (DM) as evidenced by  (Hgb A1c: 8.3).  Progressing  GOAL:   Patient will meet greater than or equal to 90% of their needs  Progressing  MONITOR:   PO intake, Labs, Weight trends, Skin, I & O's  REASON FOR ASSESSMENT:   Consult  (LVAD eval)  ASSESSMENT:   This is a 60 y.o. male with a past medical history significant for chronic systolic CHF with EF 0000000 by echo in 10/2015, mildly dilated RV, s/p medtronic AICD in 2014, CAD s/p CABG in 2000, HTN, remote cocaine abuse, smoking, depression and COPD. He has been admitted twice in May of this year for decompensated CHF with impressive diuresis (from 247 ->219 lbs) and subsequently later in the month with diuresis again down to 228 lbs. He was again admitted with CHF exacerbation in 03/2016 with low cardiac output and biventricular failure. He was started on milrinone gtts and sent home on 0.25 mcg/kg/min. He diuresed 14 lbs to a weight of 227 lbs. Most recent LVEF on echo was 20% with moderate to severe RV hypokinesis. He was referred to Dr. Hoyt Koch at [facility] for transplant work-up, but apparently this was put on hold due to ongoing tobacco use and + cocaine drug screen.  12/1- s/p rt heart cath 12/1- Premier Protein supplement ordered by MD, pt refusing  Case discussed with RN prior to visit, who reports pt has a very good appetite. Per tech, pt eats almost 100% at all meals (documented meal completion 60-100%). RN confirmed plan for LVAD placement on 09/09/16.  Spoke with pt at bedside, who was sitting in recliner chair and in good spirits. He confirms that he has a good appetite ("my rations are bigger here than at home"). Per  pt report, pt complains that he did not receive tomato slices at breakfast. RD reinforced principles of DM/Heart Healthy diet and importance of continued good meal intake to promote healing prior to and after surgery. Pt expressed understanding and intends for follow diet restrictions at discharge.   Labs reviewed: Na: 130, CBGS: 144-286.  Diet Order:  Diet heart healthy/carb modified Room service appropriate? Yes; Fluid consistency: Thin  Skin:  Reviewed, no issues  Last BM:  09/05/16  Height:   Ht Readings from Last 1 Encounters:  08/22/16 5\' 9"  (1.753 m)    Weight:   Wt Readings from Last 1 Encounters:  09/07/16 219 lb 4.8 oz (99.5 kg)    Ideal Body Weight:  72.7 kg  BMI:  Body mass index is 32.38 kg/m.  Estimated Nutritional Needs:   Kcal:  1800-2000  Protein:  95-110 grams  Fluid:  per MD  EDUCATION NEEDS:   No education needs identified at this time  Brien Lowe A. Jimmye Norman, RD, LDN, CDE Pager: [phone] After hours Pager: [phone]

## 2016-09-07 NOTE — Progress Notes (Signed)
Patient ID: Ronald Miller., male   DOB: 01/14/1956, 60 y.o.   MRN: [id number]     Advanced Heart Failure Rounding Note  PCP: [facility] Primary Cardiologist: Dr. Haroldine Laws   Subjective:   Ronald Miller. is a 60 y.o. male with history of chronic systolic HF s/p Medtronic ICD 2014, CAD s/p CABG in 2000, HTN, Hx of cocaine abuse, Tobacco abuse, Depression, and COPD.   Has been on milrinone at home since 03/2016. Admitted with 12 lb weight gain over weekend and not responding to adjustment of diuretics.  Last Echo 8/17: LVEF 20% RV moderate to severe HK.  Admitted with recurrent HF.   RHC on 11/22 on milrinone 0.5 showed persistently elevated biventricular pressures. CI was ok.  [place] 12/1 much improved.   [place] out 11/25.   CO-OX 58.9% on milrinone 0.5. Creatinine 1.05. CVP 5-6 (poor waveform)  Feeling good this morning.  Occasionally having headaches relieved with tylenol.  States when he takes [place] to sleep he occasionally "sees things crawling on the wall".  CT abdomen--> Mild cirrhotic changes. Spleen normal. No ascites.   [place] 11/22 RA = 19 RV = 55/19 PA = 52/23 (33) PCW = 26 Fick cardiac output/index = 5.1/2.3 Thermo CO/CI = 6.5/2.9 PVR = 1.0 WU Ao sat = 95% PA sat = 55%, 57% RA/PCWP = 0.73  RHC 12/1 RA = 4  RV = 39/6 PA = 44/15 (30) PCW = 18 Fick cardiac output/index = 6.1/2.9 PVR = 2.0 WU Ao sat = 93% PA sat = 66%  Objective:   Weight Range: 219 lb 4.8 oz (99.5 kg) Body mass index is 32.38 kg/m.   Vital Signs:   Temp:  [97.5 F (36.4 C)-98.1 F (36.7 C)] 97.8 F (36.6 C) (12/05 0802) Pulse Rate:  [96] 96 (12/05 0802) BP: (84-111)/(49-94) 99/65 (12/05 0802) SpO2:  [97 %] 97 % (12/05 0802) Weight:  [219 lb 4.8 oz (99.5 kg)] 219 lb 4.8 oz (99.5 kg) (12/05 0600) Last BM Date: 09/05/16  Weight change: Filed Weights   09/05/16 0403 09/06/16 0418 09/07/16 0600  Weight: 219 lb 4.8 oz (99.5 kg) 220 lb 7.4 oz (100 kg) 219 lb 4.8 oz  (99.5 kg)    Intake/Output:   Intake/Output Summary (Last 24 hours) at 09/07/16 0817 Last data filed at 09/07/16 0806  Gross per 24 hour  Intake            741.6 ml  Output             1900 ml  Net          -1158.4 ml     Physical Exam: CVP ~5-6 General: Lying in bed. NAD HEENT: Normal  Neck: supple. JVP does not appear elevated. Cor: PMI laterally displaced. RRR. No M/G/R.  Lungs: Clear, normal effort Abdomen: soft, NT, ND, no HSM. No bruits or masses. +BS  Extremities: no cyanosis, clubbing, rash. No peripheral edema. Neuro: alert & orientedx3, cranial nerves grossly intact. moves all 4 extremities w/o difficulty. Affect pleasant  Telemetry: Reviewed, NSR with pacing  Labs: CBC  Recent Labs  09/06/16 0450 09/07/16 0401  WBC 5.5 6.0  HGB 12.7* 12.4*  HCT 37.1* 36.0*  MCV 89.8 89.8  PLT 166 0000000   Basic Metabolic Panel  Recent Labs  09/06/16 0450 09/07/16 0401  NA 132* 130*  K 3.8 3.5  CL 97* 93*  CO2 29 30  GLUCOSE 220* 259*  BUN 21* 19  CREATININE 1.09 1.05  CALCIUM 8.4* 8.8*   Liver Function Tests No results for input(s): AST, ALT, ALKPHOS, BILITOT, PROT, ALBUMIN in the last 72 hours. No results for input(s): LIPASE, AMYLASE in the last 72 hours. Cardiac Enzymes No results for input(s): CKTOTAL, CKMB, CKMBINDEX, TROPONINI in the last 72 hours.  BNP: BNP (last 3 results)  Recent Labs  03/10/16 0930 03/19/16 1230 08/22/16 1514  BNP 1,369.8* 1,079.6* 1,208.4*    ProBNP (last 3 results) No results for input(s): PROBNP in the last 8760 hours.   D-Dimer No results for input(s): DDIMER in the last 72 hours. Hemoglobin A1C No results for input(s): HGBA1C in the last 72 hours. Fasting Lipid Panel No results for input(s): CHOL, HDL, LDLCALC, TRIG, CHOLHDL, LDLDIRECT in the last 72 hours. Thyroid Function Tests No results for input(s): TSH, T4TOTAL, T3FREE, THYROIDAB in the last 72 hours.  Invalid input(s): FREET3  Other  results:     Imaging/Studies:  No results found.  Latest Echo  Latest Cath   Medications:     Scheduled Medications: . aspirin EC  81 mg Oral Daily  . atorvastatin  20 mg Oral QHS  . chlorhexidine  15 mL Mouth/Throat BID  . digoxin  250 mcg Oral Daily  . escitalopram  20 mg Oral Daily  . gabapentin  100 mg Oral BID  . heparin  5,000 Units Subcutaneous Q8H  . insulin aspart  0-15 Units Subcutaneous TID WC  . insulin aspart  0-5 Units Subcutaneous QHS  . insulin aspart  10 Units Subcutaneous TID WC  . insulin glargine  28 Units Subcutaneous BID  . losartan  12.5 mg Oral BID  . multivitamin with minerals  1 tablet Oral Daily  . nicotine  21 mg Transdermal Q24H  . protein supplement shake  2 oz Oral QID  . sodium chloride flush  3 mL Intravenous Q12H  . sodium chloride flush  3 mL Intravenous Q12H  . spironolactone  25 mg Oral Daily  . torsemide  60 mg Oral Daily    Infusions: . milrinone 0.5 mcg/kg/min (09/07/16 0512)    PRN Medications: sodium chloride, sodium chloride, acetaminophen, albuterol, baclofen, ondansetron (ZOFRAN) IV, senna, sodium chloride flush, sodium chloride flush, sodium chloride flush, technetium TC 16M diethylenetriame-pentaacetic acid, traMADol, zolpidem   Assessment/Plan   1. 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  Echo 08/24/16 LVEF 15%, RV mild dilated, moderately reduced.  NYHA IV symptoms on milrinone 0.5 mcg/kg/min. [place] on 11/22 as above.  With diuresis, much improved on RHC 12/1. Today's CO-OX is 58.9% with CVP ~5-6 - Continue torsemide 60 mg daily. Renal function stable.  - No BB for now with ongoing low output - Continue digoxin, losartan, spironolactone.  Digoxin level ok 09/05/16.  - Blood type O+. Transplant listing at [facility] put on hold due to ongoing tobacco use and + urine tox screen with cocaine (which he denies).  We have been discussing LVAD>  He understands he may need  home milrinone after LVAD implant. He understands that is a possibility and he is willing to move forward even if he requires home milrinone. Palliative Care consult completed. SW evaluated for LVAD. RV would likely tolerate VAD.  Some concern regarding cirrhosis, but CT scan shows mild cirrhotic changes with normal spleen and no ascites.   - Plan on proceeding with LVAD placement Thursday, 09/09/16.   HM 2 vs HM 3 pending insurance approval. (Has been approved for HM 2) 2. AKI on CKD  III: - Improved and stable with milrinone.  - Continue to follow closely. Creatinine 1.05 3. CAD: No CP. 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 No targets for revascularization. No CP.  - Continue ASA, statin  4. DMII: Hgb A1C 8.3 on 08/26/2016  - Cover with SSI in house.  5. RLE cellulitis: Completed 8 days of Vancomycin.  6. Tobacco abuse: States he has stopped smoking 1-2 weeks PTA  - Nicotine and Cotinine were consistent with tobacco or tobacco cessation product use 08/20/16. 7. Hypokalemia:  - Stable. 8. 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 Normal spleen. No ascites.     Stable today.  Relatively optimized for surgery on Thursday.   Length of Stay: [place], [place] 09/07/2016, 8:17 AM  Advanced Heart Failure Team Pager [phone] (M-F; 7a - 4p)  Please contact [facility] for night-coverage after hours (4p -7a ) and weekends on [email or web address]  Patient seen and examined with Oda Kilts, PA-C. We discussed all aspects of the encounter. I agree with the assessment and plan as stated above.   Hemodynamics and labs reviewed. Patient currently optimized on high dose milrinone. Plan for LVAD placement on Thursday. We reviewed on MRB yesterday and plan confirmed. Request placed for HM-3 under BTT criteria. Continue to ambulate.   Bensimhon, Daniel,MD 10:13 AM

## 2016-09-07 NOTE — Progress Notes (Signed)
Daily Progress Note   Patient Name: Ronald Miller.       Date: 09/07/2016 DOB: 03/13/1956  Age: 60 y.o. MRN#: [id number] Attending Physician: Pixie Casino, MD Primary Care Physician: [place] Date: 08/22/2016  Reason for Consultation/Follow-up: LVAD workup  Subjective: -visited with patient at bedside to offer emotional support   -Ronald Miller is "ready" for anticipated LVAD placement on Thursday, he reports anxiety but is ready to do "whatever" he has to do  -we discussed attitude and motivation  Length of Stay: 16  Current Medications: Scheduled Meds:  . aspirin EC  81 mg Oral Daily  . atorvastatin  20 mg Oral QHS  . chlorhexidine  15 mL Mouth/Throat BID  . digoxin  250 mcg Oral Daily  . escitalopram  20 mg Oral Daily  . gabapentin  100 mg Oral BID  . heparin  5,000 Units Subcutaneous Q8H  . insulin aspart  0-15 Units Subcutaneous TID WC  . insulin aspart  0-5 Units Subcutaneous QHS  . insulin aspart  10 Units Subcutaneous TID WC  . insulin glargine  28 Units Subcutaneous BID  . losartan  12.5 mg Oral BID  . multivitamin with minerals  1 tablet Oral Daily  . nicotine  21 mg Transdermal Q24H  . protein supplement shake  2 oz Oral QID  . sodium chloride flush  3 mL Intravenous Q12H  . sodium chloride flush  3 mL Intravenous Q12H  . spironolactone  25 mg Oral Daily  . torsemide  60 mg Oral Daily    Continuous Infusions: . milrinone 0.5 mcg/kg/min (09/07/16 0512)    PRN Meds: sodium chloride, sodium chloride, acetaminophen, albuterol, baclofen, ondansetron (ZOFRAN) IV, senna, sodium chloride flush, sodium chloride flush, sodium chloride flush, technetium TC 57M diethylenetriame-pentaacetic acid, traMADol  Physical Exam  Constitutional: He  appears well-developed.  HENT:  Mouth/Throat: Oropharynx is clear and moist.  Pulmonary/Chest: Effort normal and breath sounds normal.  Neurological: He is alert.  Skin: Skin is warm and dry.            Vital Signs: BP 99/65 (BP Location: Left Arm)   Pulse 96   Temp 97.8 F (36.6 C) (Oral)   Resp 20   Ht 5\' 9"  (1.753 m)   Wt 99.5 kg (219 lb 4.8 oz)  SpO2 97%   BMI 32.38 kg/m  SpO2: SpO2: 97 % O2 Device: O2 Device: Not Delivered O2 Flow Rate: O2 Flow Rate (L/min): 2 L/min  Intake/output summary:  Intake/Output Summary (Last 24 hours) at 09/07/16 1039 Last data filed at 09/07/16 [id number]  Gross per 24 hour  Intake            741.6 ml  Output             1650 ml  Net           -908.4 ml   LBM: Last BM Date: 09/05/16 Baseline Weight: Weight: 102.1 kg (225 lb) Most recent weight: Weight: 99.5 kg (219 lb 4.8 oz)       Palliative Assessment/Data:    Flowsheet Rows   Flowsheet Row Most Recent Value  Intake Tab  Referral Department  Cardiology  Unit at Time of Referral  Cardiac/Telemetry Unit  Palliative Care Primary Diagnosis  Cardiac  Date Notified  08/31/16  Palliative Care Type  Return patient Palliative Care  Reason for referral  Other (Comment)  Date of Admission  08/22/16  Date first seen by Palliative Care  09/01/16  # of days Palliative referral response time  1 Day(s)  # of days IP prior to Palliative referral  9  Clinical Assessment  Psychosocial & Spiritual Assessment  Palliative Care Outcomes      Patient Active Problem List   Diagnosis Date Noted  . Palliative care by specialist   . Cirrhosis ([place])   . Cellulitis and abscess of leg 08/22/2016  . Heart failure ([place]) 08/22/2016  . Special screening for malignant neoplasms, colon 07/16/2016  . DNR (do not resuscitate) discussion   . Palliative care encounter   . Cardiogenic shock ([place])   . Acute systolic congestive heart failure, NYHA class 4 ([place])   . Pain in the chest   . Dyslipidemia associated  with type 2 diabetes mellitus ([place])   . Hyponatremia   . Emesis   . Atypical chest pain 03/19/2016  . Ascites 03/10/2016  . ICD (implantable cardioverter-defibrillator) in place 03/09/2016  . CHF (congestive heart failure) ([place]) 02/23/2016  . Chronic systolic heart failure ([place]) 02/04/2016  . Insomnia 02/04/2016  . Pressure ulcer 10/15/2015  . Fluid overload 10/14/2015  . Chronic cholecystitis with calculus   . COPD (chronic obstructive pulmonary disease) ([place]) 08/28/2015  . HTN (hypertension) 08/28/2015  . Depression 08/28/2015  . Elevated troponin 08/28/2015  . AICD discharge 08/28/2015  . Hypokalemia 08/28/2015  . Hypotension 08/28/2015  . Arteriosclerosis of coronary artery 03/05/2014  . IDDM (insulin dependent diabetes mellitus) ([place]) 03/05/2014  . HLD (hyperlipidemia) 03/05/2014  . Type 2 diabetes mellitus ([place]) 08/06/2009  . Cardiovascular disease 08/06/2009  . LOW BACK PAIN, CHRONIC 08/06/2009    Palliative Care Assessment & Plan    Assessment:  - chronic systolic HF on Milrinone, awaits LVAD placement  Recommendations  Reported difficulty sleeping and does not want to use Ambien, "it makes me see things":  Melatonin 3 mg po q hs ordered  Goals of Care and Additional Recommendations:  Successful LVAD procedure, rapid recovery and hope is for " more quality time"  Code Status:    Code Status Orders        Start     Ordered   08/22/16 1948  Full code  Continuous     08/22/16 1947    Code Status History    Date Active Date Inactive Code Status Order ID Comments User Context  03/25/2016  3:15 PM 03/25/2016  6:55 PM Partial Code [id number]  Knox Royalty, NP Inpatient   03/25/2016  1:39 PM 03/25/2016  3:14 PM DNR [id number]  Knox Royalty, NP Inpatient   03/19/2016  6:22 PM 03/25/2016  1:39 PM Full Code [id number]  Janece Canterbury, MD Inpatient   02/24/2016  1:31 AM 02/25/2016  6:48 PM Full Code [id number]  Alphia Moh, MD Inpatient   02/09/2016  5:32 PM  02/14/2016  6:37 PM Full Code [id number]  [name] Friar, PA-C Inpatient   01/26/2016 10:55 AM 01/29/2016  3:52 PM Full Code [id number]  Epifanio Lesches, MD ED   01/13/2016 12:49 AM 01/18/2016  2:59 PM Full Code [id number]  Lance Coon, MD Inpatient   10/27/2015  7:17 PM 11/03/2015  7:25 PM Full Code [id number]  Epifanio Lesches, MD ED   10/14/2015  1:39 AM 10/21/2015  8:57 PM Full Code [id number]  Saundra Shelling, MD Inpatient   08/28/2015  2:40 AM 09/01/2015  8:01 PM Full Code [id number]  Lance Coon, MD Inpatient        Care plan was discussed with Lytle Michaels PA-C  Thank you for allowing the Palliative Medicine Team to assist in the care of this patient.   Time In:  0900 Time Out:  0925 Total Time 25 min Prolonged Time Billed  no       Greater than 50%  of this time was spent counseling and coordinating care related to the above assessment and plan.  Wadie Lessen, NP  Please contact Palliative Medicine Team phone at [phone] for questions and concerns.

## 2016-09-08 ENCOUNTER — Inpatient Hospital Stay (HOSPITAL_COMMUNITY): Payer: Medicare HMO

## 2016-09-08 DIAGNOSIS — I5022 Chronic systolic (congestive) heart failure: Secondary | ICD-10-CM

## 2016-09-08 LAB — BLOOD GAS, ARTERIAL
Acid-Base Excess: 6.2 mmol/L — ABNORMAL HIGH (ref 0.0–2.0)
Bicarbonate: 30.4 mmol/L — ABNORMAL HIGH (ref 20.0–28.0)
Drawn by: 27407
FIO2: 21
O2 Saturation: 95.3 %
Patient temperature: 98.6
pCO2 arterial: 45.5 mmHg (ref 32.0–48.0)
pH, Arterial: 7.44 (ref 7.350–7.450)
pO2, Arterial: 75.3 mmHg — ABNORMAL LOW (ref 83.0–108.0)

## 2016-09-08 LAB — GLUCOSE, CAPILLARY
GLUCOSE-CAPILLARY: 183 mg/dL — AB (ref 65–99)
GLUCOSE-CAPILLARY: 136 mg/dL — AB (ref 65–99)
Glucose-Capillary: 200 mg/dL — ABNORMAL HIGH (ref 65–99)
Glucose-Capillary: 214 mg/dL — ABNORMAL HIGH (ref 65–99)

## 2016-09-08 LAB — URINALYSIS, ROUTINE W REFLEX MICROSCOPIC
Bilirubin Urine: NEGATIVE
Glucose, UA: NEGATIVE mg/dL
Hgb urine dipstick: NEGATIVE
Ketones, ur: NEGATIVE mg/dL
Leukocytes, UA: NEGATIVE
Nitrite: NEGATIVE
Protein, ur: NEGATIVE mg/dL
Specific Gravity, Urine: 1.005 (ref 1.005–1.030)
pH: 7 (ref 5.0–8.0)

## 2016-09-08 LAB — CBC
HCT: 37 % — ABNORMAL LOW (ref 39.0–52.0)
HEMOGLOBIN: 12.5 g/dL — AB (ref 13.0–17.0)
MCH: 30.8 pg (ref 26.0–34.0)
MCHC: 33.8 g/dL (ref 30.0–36.0)
MCV: 91.1 fL (ref 78.0–100.0)
Platelets: 160 10*3/uL (ref 150–400)
RBC: 4.06 MIL/uL — AB (ref 4.22–5.81)
RDW: 13.2 % (ref 11.5–15.5)
WBC: 5.5 10*3/uL (ref 4.0–10.5)

## 2016-09-08 LAB — COOXEMETRY PANEL
Carboxyhemoglobin: 1.3 % (ref 0.5–1.5)
Methemoglobin: 0.9 % (ref 0.0–1.5)
O2 SAT: 68 %
Total hemoglobin: 12.4 g/dL (ref 12.0–16.0)

## 2016-09-08 LAB — BASIC METABOLIC PANEL
Anion gap: 8 (ref 5–15)
BUN: 17 mg/dL (ref 6–20)
CHLORIDE: 96 mmol/L — AB (ref 101–111)
CO2: 29 mmol/L (ref 22–32)
Calcium: 9.1 mg/dL (ref 8.9–10.3)
Creatinine, Ser: 1.05 mg/dL (ref 0.61–1.24)
GFR calc Af Amer: >60 mL/min (ref >60)
GFR calc non Af Amer: >60 mL/min (ref >60)
GLUCOSE: 307 mg/dL — AB (ref 65–99)
POTASSIUM: 4 mmol/L (ref 3.5–5.1)
Sodium: 133 mmol/L — ABNORMAL LOW (ref 135–145)

## 2016-09-08 LAB — MRSA PCR SCREENING: MRSA by PCR: NEGATIVE

## 2016-09-08 LAB — PREPARE RBC (CROSSMATCH)

## 2016-09-08 LAB — MAGNESIUM: Magnesium: 2 mg/dL (ref 1.7–2.4)

## 2016-09-08 LAB — SURGICAL PCR SCREEN
MRSA, PCR: NEGATIVE
Staphylococcus aureus: NEGATIVE

## 2016-09-08 LAB — APTT: aPTT: 30 seconds (ref 24–36)

## 2016-09-08 LAB — PROTIME-INR
INR: 1.02
Prothrombin Time: 13.4 seconds (ref 11.4–15.2)

## 2016-09-08 MED ORDER — CHLORHEXIDINE GLUCONATE 4 % EX LIQD
60.0000 mL | Freq: Once | CUTANEOUS | Status: AC
  Administered 2016-09-08: 4 via TOPICAL
  Filled 2016-09-08: qty 15

## 2016-09-08 MED ORDER — BISACODYL 5 MG PO TBEC
5.0000 mg | DELAYED_RELEASE_TABLET | Freq: Once | ORAL | Status: AC
  Administered 2016-09-08: 5 mg via ORAL
  Filled 2016-09-08: qty 1

## 2016-09-08 MED ORDER — EPINEPHRINE PF 1 MG/ML IJ SOLN
0.0000 ug/min | INTRAVENOUS | Status: DC
  Filled 2016-09-08: qty 4

## 2016-09-08 MED ORDER — DEXTROSE 5 % IV SOLN
750.0000 mg | INTRAVENOUS | Status: DC
  Filled 2016-09-08: qty 750

## 2016-09-08 MED ORDER — TRANEXAMIC ACID (OHS) PUMP PRIME SOLUTION
2.0000 mg/kg | INTRAVENOUS | Status: DC
  Filled 2016-09-08: qty 1.88

## 2016-09-08 MED ORDER — DEXMEDETOMIDINE HCL IN NACL 400 MCG/100ML IV SOLN
0.1000 ug/kg/h | INTRAVENOUS | Status: AC
  Administered 2016-09-09: 15:00:00 via INTRAVENOUS
  Administered 2016-09-09: .3 ug/kg/h via INTRAVENOUS
  Filled 2016-09-08: qty 100

## 2016-09-08 MED ORDER — NITROGLYCERIN IN D5W 200-5 MCG/ML-% IV SOLN: 2.0000 ug/min | INTRAVENOUS | Status: DC

## 2016-09-08 MED ORDER — CHLORHEXIDINE GLUCONATE 0.12 % MT SOLN
15.0000 mL | Freq: Once | OROMUCOSAL | Status: AC
  Administered 2016-09-09: 15 mL via OROMUCOSAL
  Filled 2016-09-08: qty 15

## 2016-09-08 MED ORDER — DOPAMINE-DEXTROSE 3.2-5 MG/ML-% IV SOLN: 0.0000 ug/kg/min | INTRAVENOUS | Status: DC

## 2016-09-08 MED ORDER — DEXMEDETOMIDINE HCL IN NACL 400 MCG/100ML IV SOLN: 0.1000 ug/kg/h | INTRAVENOUS | Status: DC

## 2016-09-08 MED ORDER — VANCOMYCIN HCL 10 G IV SOLR
1500.0000 mg | INTRAVENOUS | Status: AC
  Administered 2016-09-09: 1500 mg via INTRAVENOUS
  Filled 2016-09-08: qty 1500

## 2016-09-08 MED ORDER — MAGNESIUM SULFATE 50 % IJ SOLN
40.0000 meq | INTRAMUSCULAR | Status: DC
  Filled 2016-09-08: qty 10

## 2016-09-08 MED ORDER — SODIUM CHLORIDE 0.9 % IV SOLN
0.0400 [IU]/min | INTRAVENOUS | Status: DC
  Filled 2016-09-08: qty 2

## 2016-09-08 MED ORDER — TEMAZEPAM 15 MG PO CAPS
15.0000 mg | ORAL_CAPSULE | Freq: Once | ORAL | Status: AC | PRN
  Administered 2016-09-08: 15 mg via ORAL
  Filled 2016-09-08: qty 1

## 2016-09-08 MED ORDER — TRANEXAMIC ACID (OHS) BOLUS VIA INFUSION
15.0000 mg/kg | INTRAVENOUS | Status: AC
  Administered 2016-09-09: 1411.5 mg via INTRAVENOUS
  Filled 2016-09-08: qty 1412

## 2016-09-08 MED ORDER — PLASMA-LYTE 148 IV SOLN
INTRAVENOUS | Status: DC
  Filled 2016-09-08: qty 2.5

## 2016-09-08 MED ORDER — EPINEPHRINE PF 1 MG/ML IJ SOLN
0.0000 ug/min | INTRAVENOUS | Status: AC
  Administered 2016-09-09: 1 ug/kg/min via INTRAVENOUS
  Filled 2016-09-08: qty 4

## 2016-09-08 MED ORDER — SODIUM CHLORIDE 0.9 % IV SOLN
INTRAVENOUS | Status: DC
  Filled 2016-09-08: qty 30

## 2016-09-08 MED ORDER — CHLORHEXIDINE GLUCONATE 4 % EX LIQD
60.0000 mL | Freq: Once | CUTANEOUS | Status: AC
  Administered 2016-09-09: 4 via TOPICAL
  Filled 2016-09-08: qty 60

## 2016-09-08 MED ORDER — PHENYLEPHRINE HCL 10 MG/ML IJ SOLN
30.0000 ug/min | INTRAVENOUS | Status: DC
  Filled 2016-09-08: qty 2

## 2016-09-08 MED ORDER — DOPAMINE-DEXTROSE 3.2-5 MG/ML-% IV SOLN
0.0000 ug/kg/min | INTRAVENOUS | Status: DC
  Filled 2016-09-08: qty 250

## 2016-09-08 MED ORDER — VANCOMYCIN HCL 10 G IV SOLR
1500.0000 mg | INTRAVENOUS | Status: DC
  Filled 2016-09-08: qty 1500

## 2016-09-08 MED ORDER — POTASSIUM CHLORIDE 2 MEQ/ML IV SOLN
80.0000 meq | INTRAVENOUS | Status: DC
  Filled 2016-09-08: qty 40

## 2016-09-08 MED ORDER — FLUCONAZOLE IN SODIUM CHLORIDE 400-0.9 MG/200ML-% IV SOLN
400.0000 mg | INTRAVENOUS | Status: AC
  Administered 2016-09-09: 400 mg via INTRAVENOUS
  Filled 2016-09-08: qty 200

## 2016-09-08 MED ORDER — INSULIN REGULAR HUMAN 100 UNIT/ML IJ SOLN
INTRAMUSCULAR | Status: DC
  Filled 2016-09-08: qty 2.5

## 2016-09-08 MED ORDER — DEXTROSE 5 % IV SOLN
1.5000 g | INTRAVENOUS | Status: AC
  Administered 2016-09-09: 1.5 g via INTRAVENOUS
  Filled 2016-09-08: qty 1.5

## 2016-09-08 MED ORDER — TRANEXAMIC ACID 1000 MG/10ML IV SOLN
1.5000 mg/kg/h | INTRAVENOUS | Status: AC
  Administered 2016-09-09: 15:00:00 via INTRAVENOUS
  Administered 2016-09-09: 1.5 mg/kg/h via INTRAVENOUS
  Filled 2016-09-08: qty 25

## 2016-09-08 MED ORDER — DEXTROSE 5 % IV SOLN
1.5000 g | INTRAVENOUS | Status: DC
  Filled 2016-09-08: qty 1.5

## 2016-09-08 MED ORDER — DOBUTAMINE IN D5W 4-5 MG/ML-% IV SOLN
2.0000 ug/kg/min | INTRAVENOUS | Status: DC
  Filled 2016-09-08: qty 250

## 2016-09-08 MED ORDER — TRANEXAMIC ACID 1000 MG/10ML IV SOLN
1.5000 mg/kg/h | INTRAVENOUS | Status: DC
  Filled 2016-09-08: qty 25

## 2016-09-08 MED ORDER — MILRINONE LACTATE IN DEXTROSE 20-5 MG/100ML-% IV SOLN
0.3000 ug/kg/min | INTRAVENOUS | Status: AC
  Administered 2016-09-09: 0.5 ug/kg/min via INTRAVENOUS
  Administered 2016-09-09: .5 ug/kg/min via INTRAVENOUS
  Filled 2016-09-08: qty 100

## 2016-09-08 MED ORDER — RIFAMPIN 600 MG IV SOLR
600.0000 mg | INTRAVENOUS | Status: AC
  Administered 2016-09-09: 600 mg via INTRAVENOUS
  Filled 2016-09-08: qty 600

## 2016-09-08 MED ORDER — DIAZEPAM 5 MG PO TABS
5.0000 mg | ORAL_TABLET | Freq: Once | ORAL | Status: AC
  Administered 2016-09-09: 5 mg via ORAL
  Filled 2016-09-08: qty 1

## 2016-09-08 MED ORDER — CHLORHEXIDINE GLUCONATE 4 % EX LIQD: 60.0000 mL | Freq: Once | CUTANEOUS | Status: DC

## 2016-09-08 MED ORDER — PHENYLEPHRINE HCL 10 MG/ML IJ SOLN
0.0000 ug/min | INTRAVENOUS | Status: DC
  Filled 2016-09-08: qty 2

## 2016-09-08 MED ORDER — VANCOMYCIN HCL 1000 MG IV SOLR
1000.0000 mg | INTRAVENOUS | Status: AC
  Administered 2016-09-09: 1000 mg
  Filled 2016-09-08 (×2): qty 1000

## 2016-09-08 MED ORDER — DEXTROSE 5 % IV SOLN
0.0000 ug/min | INTRAVENOUS | Status: AC
  Administered 2016-09-09: 1 ug/min via INTRAVENOUS
  Filled 2016-09-08: qty 4

## 2016-09-08 MED ORDER — MUPIROCIN 2 % EX OINT
1.0000 "application " | TOPICAL_OINTMENT | Freq: Two times a day (BID) | CUTANEOUS | Status: AC
  Administered 2016-09-08 (×2): 1 via NASAL
  Filled 2016-09-08: qty 22

## 2016-09-08 MED ORDER — TRANEXAMIC ACID (OHS) BOLUS VIA INFUSION: 15.0000 mg/kg | INTRAVENOUS | Status: DC

## 2016-09-08 MED ORDER — NITROGLYCERIN IN D5W 200-5 MCG/ML-% IV SOLN
0.0000 ug/min | INTRAVENOUS | Status: DC
  Filled 2016-09-08: qty 250

## 2016-09-08 MED ORDER — SODIUM CHLORIDE 0.9 % IV SOLN
INTRAVENOUS | Status: AC
  Administered 2016-09-09: 1.9 [IU]/h via INTRAVENOUS
  Filled 2016-09-08: qty 2.5

## 2016-09-08 NOTE — Progress Notes (Signed)
CARDIAC REHAB PHASE I   PRE:  Rate/Rhythm: 92 paced  BP:  Sitting: 106/69        SaO2: 96 RA  MODE:  Ambulation: 940 ft   POST:  Rate/Rhythm: 110 ST  BP:  Sitting: 118/82         SaO2: 97 RA  Pt ambulated 940 ft on RA, IV, assist x1, steady gait, tolerated well with no complaints. Pt states he is anxious about his surgery tomorrow, feels tired, emotional support given to pt. Briefly discussed IS, sternal precautions, activity progression post-op. Pt verbalized understanding. Pt to recliner after walk, feet elevated, call bell within reach. Will follow post-op.  [id number] Lenna Sciara, RN, BSN 09/08/2016 3:22 PM

## 2016-09-08 NOTE — Progress Notes (Signed)
Patient ID: Ronald Crilly., male   DOB: 1955-12-31, 60 y.o.   MRN: [id number]     Advanced Heart Failure Rounding Note  PCP: [facility] Primary Cardiologist: Dr. Haroldine Laws   Subjective:   Ronald Miller. is a 60 y.o. male with history of chronic systolic HF s/p Medtronic ICD 2014, CAD s/p CABG in 2000, HTN, Hx of cocaine abuse, Tobacco abuse, Depression, and COPD.   Has been on milrinone at home since 03/2016. Admitted with 12 lb weight gain over weekend and not responding to adjustment of diuretics.  Last Echo 8/17: LVEF 20% RV moderate to severe HK.  Admitted with recurrent HF.   RHC on 11/22 on milrinone 0.5 showed persistently elevated biventricular pressures. CI was ok.  [place] 12/1 much improved.   [place] out 11/25.   CO-OX 68.0% on milrinone 0.5. Creatinine stable at 1.05. CVP 5-6   Feeling fine. Anxious for surgery tomorrow.   CT abdomen--> Mild cirrhotic changes. Spleen normal. No ascites.   [place] 11/22 RA = 19 RV = 55/19 PA = 52/23 (33) PCW = 26 Fick cardiac output/index = 5.1/2.3 Thermo CO/CI = 6.5/2.9 PVR = 1.0 WU Ao sat = 95% PA sat = 55%, 57% RA/PCWP = 0.73  RHC 12/1 RA = 4  RV = 39/6 PA = 44/15 (30) PCW = 18 Fick cardiac output/index = 6.1/2.9 PVR = 2.0 WU Ao sat = 93% PA sat = 66%  Objective:   Weight Range: 207 lb 6.4 oz (94.1 kg) Body mass index is 30.63 kg/m.   Vital Signs:   Temp:  [97.7 F (36.5 C)-98.5 F (36.9 C)] 97.7 F (36.5 C) (12/06 0759) Pulse Rate:  [89-102] 89 (12/06 0759) Resp:  [18] 18 (12/06 0349) BP: (87-110)/(4-74) 108/70 (12/06 0759) SpO2:  [94 %-99 %] 97 % (12/06 0759) Weight:  [207 lb 6.4 oz (94.1 kg)] 207 lb 6.4 oz (94.1 kg) (12/06 0500) Last BM Date: 09/05/16  Weight change: Filed Weights   09/06/16 0418 09/07/16 0600 09/08/16 0500  Weight: 220 lb 7.4 oz (100 kg) 219 lb 4.8 oz (99.5 kg) 207 lb 6.4 oz (94.1 kg)    Intake/Output:   Intake/Output Summary (Last 24 hours) at 09/08/16  0840 Last data filed at 09/08/16 0500  Gross per 24 hour  Intake              164 ml  Output             3750 ml  Net            -3586 ml     Physical Exam: CVP 6-7  General: Seated in chair.  HEENT: Normal Neck: supple. JVP 6-7 Cor: PMI laterally displaced. RRR. No M/G/R Lungs: Clear, normal effort Abdomen: soft, NT, ND, no HSM. No bruits or masses. +BS   Extremities: no cyanosis, clubbing, rash. No peripheral edema. Neuro: alert & orientedx3, cranial nerves grossly intact. moves all 4 extremities w/o difficulty. Affect pleasant  Telemetry: Reviewed, NSR with pacing  Labs: CBC  Recent Labs  09/07/16 0401 09/08/16 0603  WBC 6.0 5.5  HGB 12.4* 12.5*  HCT 36.0* 37.0*  MCV 89.8 91.1  PLT 160 0000000   Basic Metabolic Panel  Recent Labs  09/07/16 0401 09/08/16 0603  NA 130* 133*  K 3.5 4.0  CL 93* 96*  CO2 30 29  GLUCOSE 259* 307*  BUN 19 17  CREATININE 1.05 1.05  CALCIUM 8.8* 9.1  MG  --  2.0  Liver Function Tests No results for input(s): AST, ALT, ALKPHOS, BILITOT, PROT, ALBUMIN in the last 72 hours. No results for input(s): LIPASE, AMYLASE in the last 72 hours. Cardiac Enzymes No results for input(s): CKTOTAL, CKMB, CKMBINDEX, TROPONINI in the last 72 hours.  BNP: BNP (last 3 results)  Recent Labs  03/10/16 0930 03/19/16 1230 08/22/16 1514  BNP 1,369.8* 1,079.6* 1,208.4*    ProBNP (last 3 results) No results for input(s): PROBNP in the last 8760 hours.   D-Dimer No results for input(s): DDIMER in the last 72 hours. Hemoglobin A1C No results for input(s): HGBA1C in the last 72 hours. Fasting Lipid Panel No results for input(s): CHOL, HDL, LDLCALC, TRIG, CHOLHDL, LDLDIRECT in the last 72 hours. Thyroid Function Tests No results for input(s): TSH, T4TOTAL, T3FREE, THYROIDAB in the last 72 hours.  Invalid input(s): FREET3  Other results:     Imaging/Studies:  Dg Chest 2 View  Result Date: 09/08/2016 CLINICAL DATA:  CHF. EXAM: CHEST  2  VIEW COMPARISON:  09/04/2016. FINDINGS: Cardiac pacer with lead tip over the right atrium right ventricle. AICD noted. Right PICC line noted in stable position. Stable cardiomegaly. Low lung volumes. IMPRESSION: 1. Right PICC line in stable position. 2.  AICD in stable position.  Prior CABG.  Stable cardiomegaly. 3. Low lung volumes with mild basilar atelectasis. Electronically Signed   By: Marcello Moores  Register   On: 09/08/2016 08:36    Latest Echo  Latest Cath   Medications:     Scheduled Medications: . aspirin EC  81 mg Oral Daily  . atorvastatin  20 mg Oral QHS  . chlorhexidine  15 mL Mouth/Throat BID  . digoxin  250 mcg Oral Daily  . escitalopram  20 mg Oral Daily  . gabapentin  100 mg Oral BID  . heparin  5,000 Units Subcutaneous Q8H  . insulin aspart  0-15 Units Subcutaneous TID WC  . insulin aspart  0-5 Units Subcutaneous QHS  . insulin aspart  10 Units Subcutaneous TID WC  . insulin glargine  28 Units Subcutaneous BID  . losartan  12.5 mg Oral BID  . multivitamin with minerals  1 tablet Oral Daily  . nicotine  21 mg Transdermal Q24H  . sodium chloride flush  3 mL Intravenous Q12H  . sodium chloride flush  3 mL Intravenous Q12H  . spironolactone  25 mg Oral Daily  . torsemide  60 mg Oral Daily    Infusions: . milrinone 0.5 mcg/kg/min (09/08/16 0508)    PRN Medications: sodium chloride, sodium chloride, acetaminophen, albuterol, baclofen, Melatonin, ondansetron (ZOFRAN) IV, senna, sodium chloride flush, sodium chloride flush, sodium chloride flush, technetium TC 75M diethylenetriame-pentaacetic acid, traMADol   Assessment/Plan   1. 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  Echo 08/24/16 LVEF 15%, RV mild dilated, moderately reduced.  NYHA IV symptoms on milrinone 0.5 mcg/kg/min. [place] on 11/22 as above.  With diuresis, much improved on RHC 12/1. Today's CO-OX is 58.9% with CVP ~5-6 - Continue torsemide 60 mg daily.  Renal function stable.  - No BB for now with ongoing low output - Continue digoxin, losartan, spironolactone.  Digoxin level ok 09/05/16.  - Blood type O+. Transplant listing at [facility] put on hold due to ongoing tobacco use and + urine tox screen with cocaine (which he denies).  We have been discussing LVAD>  He understands he may need home milrinone after LVAD implant. - Palliative Care consult completed. SW evaluated for LVAD. RV  would likely tolerate VAD.  Some concern regarding cirrhosis, but CT scan shows mild cirrhotic changes with normal spleen and no ascites.   - Plan on proceeding with LVAD placement Thursday, 09/09/16.  Have requested HM 3 via insurance.  2. AKI on CKD III: - Improved and stable with milrinone.  - Continue to follow closely. Creatinine 1.05 3. CAD: No CP. 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 No targets for revascularization. No CP.  - Continue ASA, statin  4. DMII: Hgb A1C 8.3 on 08/26/2016  - Cover with SSI in house.  5. RLE cellulitis: Completed 8 days of Vancomycin.  6. Tobacco abuse: States he has stopped smoking 1-2 weeks PTA  - Nicotine and Cotinine were consistent with tobacco or tobacco cessation product use 08/20/16. 7. Hypokalemia:  - Stable. 8. 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 Normal spleen. No ascites.     Stable today. Optimized for surgery tomorrow. Ambulate today.   Length of Stay: [place], [place] 09/08/2016, 8:40 AM  Advanced Heart Failure Team Pager [phone] (M-F; 7a - 4p)  Please contact [facility] for night-coverage after hours (4p -7a ) and weekends on [email or web address]  Patient seen with PA, agree with the above note.  He is stable on current support. Plan for LVAD placement tomorrow, working on getting approval for HM3.   Loralie Champagne 09/08/2016 9:59 AM

## 2016-09-08 NOTE — Progress Notes (Signed)
Initial Encounter with LVAD Team and MCS Introduction:  Ronald Miller. is a 60 y.o. male whom  has a past medical history of AICD (automatic cardioverter/defibrillator) present; ASCVD (arteriosclerotic cardiovascular disease); Chronic systolic CHF (congestive heart failure) (HCC); COPD (chronic obstructive pulmonary disease) ([place]); Coronary artery disease; Depression; Diabetes mellitus; History of cocaine abuse; Hypertension; Presence of permanent cardiac pacemaker; Shortness of breath dyspnea; Suicidal ideation; and Tobacco abuse.. We have been asked to evaluate the patient for advanced therapies which include Left Ventricular Assist Device implantation.   Lab Results  Component Value Date   ABORH O POS 09/08/2016    Lab Results  Component Value Date   HGBA1C 8.3 (H) 08/26/2016   Lab Results  Component Value Date   CREATININE 1.05 09/08/2016   CREATININE 1.05 09/07/2016   CREATININE 1.09 09/06/2016    VAD educational packet including "HM II Patient Handbook", "HM II Left Ventricular Assist System" packet, and "[place] HM II Patient Education" reviewed in detail with me and left at bedside for continued reference. Patient education DVD was given to her as well for reference should she want to see more about the equipment at home after reviewing the information I left her.   Explained that LVAD can be implanted for two indications in the setting of advanced left ventricular heart failure treatment:  Bridge to transplant - used for patients who cannot safely wait for heart transplant without this device.  Or   Destination therapy - used for patients until end of life or recovery of heart function.  Discussed that at this point Gunnar Fusi. would be considered for Bridge to [place] / Destination Therapy should he be deemed an acceptable VAD candidate.   Provided brief equipment overview of the HeartMate II pump and discussed placement, surgical procedure, peripheral  equipment, life-long coumadin therapy, importance of medication adherence and clinic follow up for as long as patient is living on support, life-style modifications, as well as need for caregiver to be successful with this therapy.   The patient understood a lot of the therapy as his father's fiance received one. We discussed the process of the evaluation period and how a decision was made by the [facility] team whether he would be an appropriate candidate for therapy or not. Evaluation consent was reviewed and signed and provided for reference.   Caregiver Support: mother and sister  Home Inspection Checklist: 3-prong grounded outlets verified by patient. One being installed into bedroom currently.   Advised the patient review the materials, contact me with questions and we will plan on meeting with her at next scheduled clinic appointment. Verbalized he/she would review the evaluation consent and make a decision regarding the evaluation process.   Session Time: 55 min  [name], RN VAD Coordinator   Office: [phone] 24/7 VAD Pager: [phone]

## 2016-09-08 NOTE — Progress Notes (Signed)
Met with patient again for VAD pre-education. Pre-implant INTERMACs documents were complete independently by patient. Trail Making was not completed correctly. 6 minute walk test completed (see cardiac rehab note for details).   Pre-VAD Education performed. Ronald Miller has already met with an existing VAD Patient and understands there is a possibility he may go home with PICC line/Milrinone and LVAD for undetermined amount of time. Review of equipment components (system controller, mobile power unit, driveline/LVAD components, and batteries/clips/charger) and overview of basic function.   Ronald Madeira, RN VAD Coordinator   Office: [phone] 24/7 Emergency VAD Pager: [phone]

## 2016-09-08 NOTE — Consult Note (Signed)
Anesthesiology Pre-operative Note:  60 year old male with severe ischemic cardiomyopathy and biventricular heart failure scheduled for LVAD implantation in AM. Not presently a transplant candidate due to ongoing tabbaco use and positive drug screen for cocaine (which he denies).  Problems:   1. Ischemic Cardiomyopathy: CABG 2014.   LH Cath:  02/12/16: severe native 3V CAD all grafts occluded except LIMA, no targets for revascularization.  2. Biventricular Heart Failure: Multiple admissions in last year for fluid overload and ascites. On home milrinone since 03/23/16. Now at 0.5 mcg/kg/min. Markedly improved hemodynamics since current admission on 08/22/16.   [employer]onic AICD inserted 2014  Last RHC 09/03/16: RA = 4  RV = 39/6 PA = 44/15 (30) PCW = 18 Fick cardiac output/index = 6.1/2.9 PVR = 2.0 WU Ao sat = 93% PA sat = 66%  Echo 08/24/16: LV EF: 0000000 systolic function severely reduced with akinesis of inferolateral myocardium. LVEDD:  59 mm, Aortic Valve: no AS/AI, MV: mild MR. RV: cavity mildly dilated , systolic function moderately reduced. Tricuspid Valve structurally normal with moderate TR.  3. Cirrhosis- felt secondary to RHF. CT 08/28/16: Mild cirrhotic changes, spleen normal, no ascites.  4. Type 2 DM Hgb A1C 8.3 08/26/16  5. RLE cellulitis completed 8 days of vancomycin.  6. Smoking hx/COPD  Exam: Chronically ill appearing middle aged WM. Pleasant demeanor, resting comfortably  Heart RRR Lungs- clear no crackles no wheezes Abdomen- nontender, non distended  Patient appears to be in good condition to undergo LVAD placement tomorrow. Previous history of right heart failure is worrisome.   Anesthetic plan discussed, all questions answered. He can expect to remain intubated at least overnight following surgery.  Roberts Gaudy

## 2016-09-08 NOTE — Progress Notes (Signed)
Inpatient Diabetes Program Recommendations  AACE/ADA: New Consensus Statement on Inpatient Glycemic Control (2015)  Target Ranges:  Prepandial:   less than 140 mg/dL      Peak postprandial:   less than 180 mg/dL (1-2 hours)      Critically ill patients:  140 - 180 mg/dL   Lab Results  Component Value Date   GLUCAP 200 (H) 09/08/2016   HGBA1C 8.3 (H) 08/26/2016    Review of Glycemic Control  Diabetes history:       DM2, end stage HF, AKI on CKD III, Cirrhosis Outpatient Diabetes medications:       Lantus 48 units QHS, Novolog 10 units TIDAC Current orders for Inpatient glycemic control:       Lantus 28 units BID,       Novolog 0-15 units TIDAC and 0-5 units QHS correction sliding scale,       Novolog 10 units TIDAC meal coverage.  Inpatient Diabetes Program Recommendations, please consider :     Increasing basal insulin to Lantus 35 units BID  Thank you,  Windy Carina, RN, BSN Diabetes Coordinator Inpatient Diabetes Program [phone] (Team Pager)

## 2016-09-08 NOTE — Anesthesia Preprocedure Evaluation (Addendum)
Anesthesia Evaluation  Patient identified by MRN, date of birth, ID band Patient awake    Reviewed: Allergy & Precautions, NPO status , Patient's Chart, lab work & pertinent test results  Airway Mallampati: II  TM Distance: >3 FB Neck ROM: Full    Dental  (+) Teeth Intact   Pulmonary former smoker,    breath sounds clear to auscultation       Cardiovascular hypertension,  Rhythm:Regular Rate:Normal     Neuro/Psych    GI/Hepatic   Endo/Other  diabetes  Renal/GU      Musculoskeletal   Abdominal (+)  Abdomen: soft.    Peds  Hematology   Anesthesia Other Findings   Reproductive/Obstetrics                            Anesthesia Physical Anesthesia Plan  ASA: III  Anesthesia Plan: General   Post-op Pain Management:    Induction: Intravenous  Airway Management Planned: Oral ETT  Additional Equipment: Arterial line, CVP, 3D TEE and PA Cath  Intra-op Plan:   Post-operative Plan: Post-operative intubation/ventilation  Informed Consent: I have reviewed the patients History and Physical, chart, labs and discussed the procedure including the risks, benefits and alternatives for the proposed anesthesia with the patient or authorized representative who has indicated his/her understanding and acceptance.   Dental advisory given  Plan Discussed with: CRNA and Anesthesiologist  Anesthesia Plan Comments:         Anesthesia Quick Evaluation

## 2016-09-08 NOTE — Progress Notes (Signed)
5 Days Post-Op Procedure(s) (LRB): Right Heart Cath (N/A) Subjective: The patient has been optimized for implantation of left jugular assist device scheduled for the a.m. I have discussed the procedure of LVAD implantation. The patient understands that the LVAD is being implanted to help improve survival and quality of life. The patient understands he is not currently a candidate for cardiac transplantation and is not on a transplant list because of previous positive tobacco and illegal drug screen. He understands the risks of stroke, right heart failure requiring are bad, prolonged ventilation requiring tracheostomy, multisystem failure including renal failure and dialysis, bleeding requiring transfusion, postoperative infection and sepsis, and death.  Objective: Vital signs in last 24 hours: Temp:  [97.5 F (36.4 C)-98.5 F (36.9 C)] 97.5 F (36.4 C) (12/06 1603) Pulse Rate:  [86-89] 86 (12/06 1603) Cardiac Rhythm: Normal sinus rhythm;Ventricular paced (12/06 0820) Resp:  [18] 18 (12/06 0349) BP: (87-110)/(4-77) 108/75 (12/06 1603) SpO2:  [94 %-99 %] 95 % (12/06 1603) Weight:  [207 lb 6.4 oz (94.1 kg)] 207 lb 6.4 oz (94.1 kg) (12/06 0500)  Hemodynamic parameters for last 24 hours: CVP:  [5 mmHg-9 mmHg] 7 mmHg  Intake/Output from previous day: 12/05 0701 - 12/06 0700 In: 164 [I.V.:164] Out: 4150 [Urine:4150] Intake/Output this shift: No intake/output data recorded.    Lab Results:  Recent Labs  09/07/16 0401 09/08/16 0603  WBC 6.0 5.5  HGB 12.4* 12.5*  HCT 36.0* 37.0*  PLT 160 160   BMET:  Recent Labs  09/07/16 0401 09/08/16 0603  NA 130* 133*  K 3.5 4.0  CL 93* 96*  CO2 30 29  GLUCOSE 259* 307*  BUN 19 17  CREATININE 1.05 1.05  CALCIUM 8.8* 9.1    PT/INR:  Recent Labs  09/08/16 0603  LABPROT 13.4  INR 1.02   ABG    Component Value Date/Time   PHART 7.440 09/08/2016 1245   HCO3 30.4 (H) 09/08/2016 1245   TCO2 33 09/03/2016 1642   O2SAT 95.3  09/08/2016 1245   CBG (last 3)   Recent Labs  09/08/16 0806 09/08/16 1210 09/08/16 1606  GLUCAP 200* 214* 183*    Assessment/Plan: S/P Procedure(s) (LRB): Right Heart Cath (N/A) VAD implant in a.m.   LOS: 17 days    Ronald Miller 09/08/2016

## 2016-09-09 ENCOUNTER — Inpatient Hospital Stay (HOSPITAL_COMMUNITY): Payer: Medicare HMO | Admitting: Certified Registered Nurse Anesthetist

## 2016-09-09 ENCOUNTER — Inpatient Hospital Stay (HOSPITAL_COMMUNITY): Payer: Medicare HMO

## 2016-09-09 ENCOUNTER — Encounter (HOSPITAL_COMMUNITY)
Admission: EM | Disposition: A | Discharge status: Home Health | Payer: Self-pay | Source: Home / Self Care | Attending: Internal Medicine

## 2016-09-09 DIAGNOSIS — I255 Ischemic cardiomyopathy: Secondary | ICD-10-CM

## 2016-09-09 DIAGNOSIS — I2510 Atherosclerotic heart disease of native coronary artery without angina pectoris: Secondary | ICD-10-CM

## 2016-09-09 HISTORY — PX: TEE WITHOUT CARDIOVERSION: SHX5443

## 2016-09-09 HISTORY — PX: INSERTION OF IMPLANTABLE LEFT VENTRICULAR ASSIST DEVICE: SHX5866

## 2016-09-09 LAB — POCT I-STAT, CHEM 8
BUN: 17 mg/dL (ref 6–20)
BUN: 14 mg/dL (ref 6–20)
BUN: 15 mg/dL (ref 6–20)
BUN: 15 mg/dL (ref 6–20)
BUN: 14 mg/dL (ref 6–20)
BUN: 12 mg/dL (ref 6–20)
BUN: 11 mg/dL (ref 6–20)
CALCIUM ION: 0.96 mmol/L — AB (ref 1.15–1.40)
CALCIUM ION: 1.11 mmol/L — AB (ref 1.15–1.40)
CHLORIDE: 96 mmol/L — AB (ref 101–111)
CHLORIDE: 100 mmol/L — AB (ref 101–111)
CHLORIDE: 102 mmol/L (ref 101–111)
CREATININE: 0.7 mg/dL (ref 0.61–1.24)
CREATININE: 0.7 mg/dL (ref 0.61–1.24)
CREATININE: 0.8 mg/dL (ref 0.61–1.24)
CREATININE: 0.8 mg/dL (ref 0.61–1.24)
CREATININE: 0.9 mg/dL (ref 0.61–1.24)
CREATININE: 0.9 mg/dL (ref 0.61–1.24)
Calcium, Ion: 1.25 mmol/L (ref 1.15–1.40)
Calcium, Ion: 1.1 mmol/L — ABNORMAL LOW (ref 1.15–1.40)
Calcium, Ion: 0.89 mmol/L — CL (ref 1.15–1.40)
Calcium, Ion: 1.34 mmol/L (ref 1.15–1.40)
Calcium, Ion: 1.24 mmol/L (ref 1.15–1.40)
Chloride: 95 mmol/L — ABNORMAL LOW (ref 101–111)
Chloride: 98 mmol/L — ABNORMAL LOW (ref 101–111)
Chloride: 94 mmol/L — ABNORMAL LOW (ref 101–111)
Chloride: 98 mmol/L — ABNORMAL LOW (ref 101–111)
Creatinine, Ser: 0.8 mg/dL (ref 0.61–1.24)
GLUCOSE: 156 mg/dL — AB (ref 65–99)
GLUCOSE: 197 mg/dL — AB (ref 65–99)
GLUCOSE: 166 mg/dL — AB (ref 65–99)
GLUCOSE: 144 mg/dL — AB (ref 65–99)
GLUCOSE: 174 mg/dL — AB (ref 65–99)
GLUCOSE: 176 mg/dL — AB (ref 65–99)
Glucose, Bld: 186 mg/dL — ABNORMAL HIGH (ref 65–99)
HCT: 33 % — ABNORMAL LOW (ref 39.0–52.0)
HCT: 24 % — ABNORMAL LOW (ref 39.0–52.0)
HCT: 19 % — ABNORMAL LOW (ref 39.0–52.0)
HCT: 19 % — ABNORMAL LOW (ref 39.0–52.0)
HEMATOCRIT: 36 % — AB (ref 39.0–52.0)
HEMATOCRIT: 29 % — AB (ref 39.0–52.0)
HEMATOCRIT: 24 % — AB (ref 39.0–52.0)
HEMOGLOBIN: 11.2 g/dL — AB (ref 13.0–17.0)
HEMOGLOBIN: 9.9 g/dL — AB (ref 13.0–17.0)
HEMOGLOBIN: 8.2 g/dL — AB (ref 13.0–17.0)
HEMOGLOBIN: 8.2 g/dL — AB (ref 13.0–17.0)
Hemoglobin: 12.2 g/dL — ABNORMAL LOW (ref 13.0–17.0)
Hemoglobin: 6.5 g/dL — CL (ref 13.0–17.0)
Hemoglobin: 6.5 g/dL — CL (ref 13.0–17.0)
POTASSIUM: 4.1 mmol/L (ref 3.5–5.1)
POTASSIUM: 4.4 mmol/L (ref 3.5–5.1)
POTASSIUM: 3.2 mmol/L — AB (ref 3.5–5.1)
POTASSIUM: 3.2 mmol/L — AB (ref 3.5–5.1)
Potassium: 2.7 mmol/L — CL (ref 3.5–5.1)
Potassium: 3.1 mmol/L — ABNORMAL LOW (ref 3.5–5.1)
Potassium: 3.3 mmol/L — ABNORMAL LOW (ref 3.5–5.1)
SODIUM: 139 mmol/L (ref 135–145)
SODIUM: 137 mmol/L (ref 135–145)
SODIUM: 141 mmol/L (ref 135–145)
Sodium: 138 mmol/L (ref 135–145)
Sodium: 139 mmol/L (ref 135–145)
Sodium: 135 mmol/L (ref 135–145)
Sodium: 142 mmol/L (ref 135–145)
TCO2: 32 mmol/L (ref 0–100)
TCO2: 26 mmol/L (ref 0–100)
TCO2: 27 mmol/L (ref 0–100)
TCO2: 28 mmol/L (ref 0–100)
TCO2: 28 mmol/L (ref 0–100)
TCO2: 23 mmol/L (ref 0–100)
TCO2: 24 mmol/L (ref 0–100)

## 2016-09-09 LAB — POCT I-STAT 3, ART BLOOD GAS (G3+)
ACID-BASE EXCESS: 4 mmol/L — AB (ref 0.0–2.0)
ACID-BASE EXCESS: 2 mmol/L (ref 0.0–2.0)
Acid-Base Excess: 1 mmol/L (ref 0.0–2.0)
Acid-base deficit: 3 mmol/L — ABNORMAL HIGH (ref 0.0–2.0)
Acid-base deficit: 3 mmol/L — ABNORMAL HIGH (ref 0.0–2.0)
BICARBONATE: 30.6 mmol/L — AB (ref 20.0–28.0)
Bicarbonate: 27.4 mmol/L (ref 20.0–28.0)
Bicarbonate: 27.7 mmol/L (ref 20.0–28.0)
Bicarbonate: 24.4 mmol/L (ref 20.0–28.0)
Bicarbonate: 23.3 mmol/L (ref 20.0–28.0)
O2 SAT: 100 %
O2 SAT: 100 %
O2 Saturation: 100 %
O2 Saturation: 98 %
O2 Saturation: 99 %
PCO2 ART: 51.5 mmHg — AB (ref 32.0–48.0)
PCO2 ART: 53.4 mmHg — AB (ref 32.0–48.0)
PCO2 ART: 44.5 mmHg (ref 32.0–48.0)
PH ART: 7.339 — AB (ref 7.350–7.450)
PH ART: 7.267 — AB (ref 7.350–7.450)
PH ART: 7.326 — AB (ref 7.350–7.450)
PO2 ART: 430 mmHg — AB (ref 83.0–108.0)
PO2 ART: 348 mmHg — AB (ref 83.0–108.0)
PO2 ART: 122 mmHg — AB (ref 83.0–108.0)
PO2 ART: 128 mmHg — AB (ref 83.0–108.0)
Patient temperature: 36.8
TCO2: 32 mmol/L (ref 0–100)
TCO2: 29 mmol/L (ref 0–100)
TCO2: 29 mmol/L (ref 0–100)
TCO2: 26 mmol/L (ref 0–100)
TCO2: 25 mmol/L (ref 0–100)
pCO2 arterial: 55.7 mmHg — ABNORMAL HIGH (ref 32.0–48.0)
pCO2 arterial: 46 mmHg (ref 32.0–48.0)
pH, Arterial: 7.348 — ABNORMAL LOW (ref 7.350–7.450)
pH, Arterial: 7.383 (ref 7.350–7.450)
pO2, Arterial: 263 mmHg — ABNORMAL HIGH (ref 83.0–108.0)

## 2016-09-09 LAB — GLUCOSE, CAPILLARY
GLUCOSE-CAPILLARY: 179 mg/dL — AB (ref 65–99)
GLUCOSE-CAPILLARY: 129 mg/dL — AB (ref 65–99)
GLUCOSE-CAPILLARY: 135 mg/dL — AB (ref 65–99)
GLUCOSE-CAPILLARY: 134 mg/dL — AB (ref 65–99)
GLUCOSE-CAPILLARY: 150 mg/dL — AB (ref 65–99)
GLUCOSE-CAPILLARY: 172 mg/dL — AB (ref 65–99)
GLUCOSE-CAPILLARY: 156 mg/dL — AB (ref 65–99)
Glucose-Capillary: 166 mg/dL — ABNORMAL HIGH (ref 65–99)
Glucose-Capillary: 179 mg/dL — ABNORMAL HIGH (ref 65–99)

## 2016-09-09 LAB — POCT I-STAT 4, (NA,K, GLUC, HGB,HCT)
Glucose, Bld: 133 mg/dL — ABNORMAL HIGH (ref 65–99)
HEMATOCRIT: 26 % — AB (ref 39.0–52.0)
HEMOGLOBIN: 8.8 g/dL — AB (ref 13.0–17.0)
Potassium: 3.4 mmol/L — ABNORMAL LOW (ref 3.5–5.1)
Sodium: 141 mmol/L (ref 135–145)

## 2016-09-09 LAB — BASIC METABOLIC PANEL
ANION GAP: 9 (ref 5–15)
Anion gap: 7 (ref 5–15)
BUN: 15 mg/dL (ref 6–20)
BUN: 11 mg/dL (ref 6–20)
CALCIUM: 8 mg/dL — AB (ref 8.9–10.3)
CO2: 26 mmol/L (ref 22–32)
CO2: 26 mmol/L (ref 22–32)
Calcium: 8.3 mg/dL — ABNORMAL LOW (ref 8.9–10.3)
Chloride: 101 mmol/L (ref 101–111)
Chloride: 104 mmol/L (ref 101–111)
Creatinine, Ser: 0.89 mg/dL (ref 0.61–1.24)
Creatinine, Ser: 0.94 mg/dL (ref 0.61–1.24)
GFR calc Af Amer: >60 mL/min (ref >60)
GFR calc non Af Amer: >60 mL/min (ref >60)
GLUCOSE: 237 mg/dL — AB (ref 65–99)
Glucose, Bld: 137 mg/dL — ABNORMAL HIGH (ref 65–99)
POTASSIUM: 3.3 mmol/L — AB (ref 3.5–5.1)
Potassium: 3.4 mmol/L — ABNORMAL LOW (ref 3.5–5.1)
Sodium: 136 mmol/L (ref 135–145)
Sodium: 137 mmol/L (ref 135–145)

## 2016-09-09 LAB — ECHO INTRAOPERATIVE TEE
Height: 69 in
Weight: 3323.2 oz

## 2016-09-09 LAB — PREPARE RBC (CROSSMATCH)

## 2016-09-09 LAB — CBC
HCT: 21.5 % — ABNORMAL LOW (ref 39.0–52.0)
HEMATOCRIT: 36.3 % — AB (ref 39.0–52.0)
HEMOGLOBIN: 12.5 g/dL — AB (ref 13.0–17.0)
Hemoglobin: 7.6 g/dL — ABNORMAL LOW (ref 13.0–17.0)
MCH: 30.9 pg (ref 26.0–34.0)
MCH: 31.4 pg (ref 26.0–34.0)
MCHC: 34.4 g/dL (ref 30.0–36.0)
MCHC: 35.3 g/dL (ref 30.0–36.0)
MCV: 89.6 fL (ref 78.0–100.0)
MCV: 88.8 fL (ref 78.0–100.0)
Platelets: 160 10*3/uL (ref 150–400)
Platelets: 87 10*3/uL — ABNORMAL LOW (ref 150–400)
RBC: 4.05 MIL/uL — AB (ref 4.22–5.81)
RBC: 2.42 MIL/uL — ABNORMAL LOW (ref 4.22–5.81)
RDW: 12.9 % (ref 11.5–15.5)
RDW: 13.5 % (ref 11.5–15.5)
WBC: 5.8 10*3/uL (ref 4.0–10.5)
WBC: 12.4 10*3/uL — ABNORMAL HIGH (ref 4.0–10.5)

## 2016-09-09 LAB — HEMOGLOBIN AND HEMATOCRIT, BLOOD
HCT: 22.7 % — ABNORMAL LOW (ref 39.0–52.0)
Hemoglobin: 8.2 g/dL — ABNORMAL LOW (ref 13.0–17.0)

## 2016-09-09 LAB — COOXEMETRY PANEL
Carboxyhemoglobin: 1.8 % — ABNORMAL HIGH (ref 0.5–1.5)
Carboxyhemoglobin: 1.3 % (ref 0.5–1.5)
METHEMOGLOBIN: 0.7 % (ref 0.0–1.5)
Methemoglobin: 1.6 % — ABNORMAL HIGH (ref 0.0–1.5)
O2 Saturation: 63.7 %
O2 Saturation: 78.5 %
TOTAL HEMOGLOBIN: 12.4 g/dL (ref 12.0–16.0)
Total hemoglobin: 9.5 g/dL — ABNORMAL LOW (ref 12.0–16.0)

## 2016-09-09 LAB — DIC (DISSEMINATED INTRAVASCULAR COAGULATION)PANEL
D-Dimer, Quant: 0.94 ug/mL-FEU — ABNORMAL HIGH (ref 0.00–0.50)
Fibrinogen: 207 mg/dL — ABNORMAL LOW (ref 210–475)
INR: 1.57
Platelets: 95 10*3/uL — ABNORMAL LOW (ref 150–400)
Prothrombin Time: 19 seconds — ABNORMAL HIGH (ref 11.4–15.2)
Smear Review: NONE SEEN
aPTT: 36 seconds (ref 24–36)

## 2016-09-09 LAB — CREATININE, SERUM
Creatinine, Ser: 1.04 mg/dL (ref 0.61–1.24)
GFR calc Af Amer: >60 mL/min (ref >60)
GFR calc non Af Amer: >60 mL/min (ref >60)

## 2016-09-09 LAB — MAGNESIUM
Magnesium: 1.7 mg/dL (ref 1.7–2.4)
Magnesium: 2.5 mg/dL — ABNORMAL HIGH (ref 1.7–2.4)

## 2016-09-09 LAB — PLATELET COUNT: Platelets: 98 10*3/uL — ABNORMAL LOW (ref 150–400)

## 2016-09-09 LAB — FIBRINOGEN: Fibrinogen: 199 mg/dL — ABNORMAL LOW (ref 210–475)

## 2016-09-09 SURGERY — REDO STERNOTOMY
Anesthesia: General | Site: Chest

## 2016-09-09 MED ORDER — ORAL CARE MOUTH RINSE
15.0000 mL | Freq: Four times a day (QID) | OROMUCOSAL | Status: DC
  Administered 2016-09-10 – 2016-09-12 (×9): 15 mL via OROMUCOSAL

## 2016-09-09 MED ORDER — PANTOPRAZOLE SODIUM 40 MG PO TBEC
40.0000 mg | DELAYED_RELEASE_TABLET | Freq: Every day | ORAL | Status: DC
  Administered 2016-09-11 – 2016-09-25 (×15): 40 mg via ORAL
  Filled 2016-09-09 (×15): qty 1

## 2016-09-09 MED ORDER — LACTATED RINGERS IV SOLN: 500.0000 mL | Freq: Once | INTRAVENOUS | Status: DC | PRN

## 2016-09-09 MED ORDER — POTASSIUM CHLORIDE 10 MEQ/50ML IV SOLN
10.0000 meq | Freq: Once | INTRAVENOUS | Status: DC
  Filled 2016-09-09 (×2): qty 50

## 2016-09-09 MED ORDER — DEXMEDETOMIDINE HCL IN NACL 200 MCG/50ML IV SOLN
0.1000 ug/kg/h | INTRAVENOUS | Status: DC
  Filled 2016-09-09: qty 50

## 2016-09-09 MED ORDER — SODIUM CHLORIDE 0.9 % IV SOLN
INTRAVENOUS | Status: DC | PRN
  Administered 2016-09-09: 09:00:00 500 mL

## 2016-09-09 MED ORDER — ROCURONIUM BROMIDE 10 MG/ML (PF) SYRINGE
PREFILLED_SYRINGE | INTRAVENOUS | Status: AC
  Filled 2016-09-09: qty 20

## 2016-09-09 MED ORDER — SODIUM CHLORIDE 0.9% FLUSH
3.0000 mL | Freq: Two times a day (BID) | INTRAVENOUS | Status: DC
  Administered 2016-09-10 – 2016-09-12 (×5): 3 mL via INTRAVENOUS

## 2016-09-09 MED ORDER — COAGULATION FACTOR VIIA RECOMB 1 MG IV SOLR
30.0000 ug/kg | Freq: Once | INTRAVENOUS | Status: AC
  Administered 2016-09-09: 3000 ug via INTRAVENOUS
  Filled 2016-09-09: qty 3

## 2016-09-09 MED ORDER — DEXTROSE 5 % IV SOLN
1.5000 g | Freq: Two times a day (BID) | INTRAVENOUS | Status: AC
  Administered 2016-09-09 – 2016-09-11 (×4): 1.5 g via INTRAVENOUS
  Filled 2016-09-09 (×4): qty 1.5

## 2016-09-09 MED ORDER — SODIUM CHLORIDE 0.9% FLUSH: 3.0000 mL | INTRAVENOUS | Status: DC | PRN

## 2016-09-09 MED ORDER — ROCURONIUM BROMIDE 10 MG/ML (PF) SYRINGE
PREFILLED_SYRINGE | INTRAVENOUS | Status: DC | PRN
  Administered 2016-09-09 (×6): 50 mg via INTRAVENOUS

## 2016-09-09 MED ORDER — MIDAZOLAM HCL 10 MG/2ML IJ SOLN
INTRAMUSCULAR | Status: AC
  Filled 2016-09-09: qty 2

## 2016-09-09 MED ORDER — DEXTROSE 5 % IV SOLN
INTRAVENOUS | Status: DC | PRN
  Administered 2016-09-09: .75 g via INTRAVENOUS

## 2016-09-09 MED ORDER — ONDANSETRON HCL 4 MG/2ML IJ SOLN: 4.0000 mg | Freq: Four times a day (QID) | INTRAMUSCULAR | Status: DC | PRN

## 2016-09-09 MED ORDER — ACETAMINOPHEN 500 MG PO TABS
1000.0000 mg | ORAL_TABLET | Freq: Four times a day (QID) | ORAL | Status: AC
  Administered 2016-09-10 – 2016-09-14 (×14): 1000 mg via ORAL
  Filled 2016-09-09 (×15): qty 2

## 2016-09-09 MED ORDER — LACTATED RINGERS IV SOLN
INTRAVENOUS | Status: DC | PRN
  Administered 2016-09-09 (×2): via INTRAVENOUS

## 2016-09-09 MED ORDER — HEPARIN SODIUM (PORCINE) 1000 UNIT/ML IJ SOLN
INTRAMUSCULAR | Status: AC
Start: 2016-09-09 — End: 2016-09-09
  Filled 2016-09-09: qty 1

## 2016-09-09 MED ORDER — PROTAMINE SULFATE 10 MG/ML IV SOLN
INTRAVENOUS | Status: DC | PRN
  Administered 2016-09-09: 50 mg via INTRAVENOUS
  Administered 2016-09-09: 30 mg via INTRAVENOUS

## 2016-09-09 MED ORDER — SODIUM CHLORIDE 0.9 % IR SOLN
Status: DC | PRN
  Administered 2016-09-09: 1000 mL

## 2016-09-09 MED ORDER — SODIUM CHLORIDE 0.9 % IV SOLN
INTRAVENOUS | Status: DC
  Administered 2016-09-13 – 2016-09-17 (×2): via INTRAVENOUS

## 2016-09-09 MED ORDER — NOREPINEPHRINE BITARTRATE 1 MG/ML IV SOLN
0.0000 ug/min | INTRAVENOUS | Status: DC
  Filled 2016-09-09: qty 4

## 2016-09-09 MED ORDER — MILRINONE LACTATE IN DEXTROSE 20-5 MG/100ML-% IV SOLN
0.3000 ug/kg/min | INTRAVENOUS | Status: DC
  Administered 2016-09-09 – 2016-09-16 (×15): 0.3 ug/kg/min via INTRAVENOUS
  Filled 2016-09-09 (×14): qty 100

## 2016-09-09 MED ORDER — SODIUM CHLORIDE 0.9 % IV SOLN
1.5000 mg/kg/h | INTRAVENOUS | Status: DC
  Filled 2016-09-09: qty 25

## 2016-09-09 MED ORDER — VANCOMYCIN HCL 1000 MG IV SOLR
INTRAVENOUS | Status: AC
  Filled 2016-09-09: qty 1000

## 2016-09-09 MED ORDER — LACTATED RINGERS IV SOLN
INTRAVENOUS | Status: DC | PRN
  Administered 2016-09-09 (×2): via INTRAVENOUS

## 2016-09-09 MED ORDER — FENTANYL CITRATE (PF) 250 MCG/5ML IJ SOLN
INTRAMUSCULAR | Status: AC
  Filled 2016-09-09: qty 25

## 2016-09-09 MED ORDER — 0.9 % SODIUM CHLORIDE (POUR BTL) OPTIME
TOPICAL | Status: DC | PRN
  Administered 2016-09-09: 5000 mL

## 2016-09-09 MED ORDER — DOCUSATE SODIUM 100 MG PO CAPS
200.0000 mg | ORAL_CAPSULE | Freq: Every day | ORAL | Status: DC
  Administered 2016-09-11 – 2016-09-25 (×15): 200 mg via ORAL
  Filled 2016-09-09 (×15): qty 2

## 2016-09-09 MED ORDER — SODIUM CHLORIDE 0.9 % IV SOLN: Freq: Once | INTRAVENOUS | Status: AC

## 2016-09-09 MED ORDER — BISACODYL 10 MG RE SUPP
10.0000 mg | Freq: Every day | RECTAL | Status: DC
  Administered 2016-09-10: 10 mg via RECTAL
  Filled 2016-09-09: qty 1

## 2016-09-09 MED ORDER — SODIUM CHLORIDE 0.9 % IV SOLN: 250.0000 mL | INTRAVENOUS | Status: DC

## 2016-09-09 MED ORDER — NOREPINEPHRINE BITARTRATE 1 MG/ML IV SOLN
0.0000 ug/min | INTRAVENOUS | Status: DC
  Administered 2016-09-10: 6 ug/min via INTRAVENOUS
  Filled 2016-09-09 (×2): qty 4

## 2016-09-09 MED ORDER — INSULIN REGULAR HUMAN 100 UNIT/ML IJ SOLN
INTRAMUSCULAR | Status: DC
  Administered 2016-09-09: 4.5 [IU]/h via INTRAVENOUS
  Filled 2016-09-09: qty 2.5

## 2016-09-09 MED ORDER — FENTANYL CITRATE (PF) 100 MCG/2ML IJ SOLN
25.0000 ug | INTRAMUSCULAR | Status: DC | PRN
  Filled 2016-09-09: qty 2

## 2016-09-09 MED ORDER — LACTATED RINGERS IV SOLN: INTRAVENOUS | Status: DC

## 2016-09-09 MED ORDER — CALCIUM CHLORIDE 10 % IV SOLN
INTRAVENOUS | Status: DC | PRN
  Administered 2016-09-09: 1 g via INTRAVENOUS

## 2016-09-09 MED ORDER — POTASSIUM CHLORIDE 2 MEQ/ML IV SOLN
30.0000 meq | Freq: Once | INTRAVENOUS | Status: AC
  Administered 2016-09-09: 30 meq via INTRAVENOUS
  Filled 2016-09-09: qty 15

## 2016-09-09 MED ORDER — SODIUM CHLORIDE 0.9 % IV SOLN: Freq: Once | INTRAVENOUS | Status: DC

## 2016-09-09 MED ORDER — SODIUM CHLORIDE 0.9 % IJ SOLN
INTRAMUSCULAR | Status: DC | PRN
  Administered 2016-09-09 (×5): 4 mL via TOPICAL

## 2016-09-09 MED ORDER — VANCOMYCIN HCL 10 G IV SOLR
1250.0000 mg | Freq: Two times a day (BID) | INTRAVENOUS | Status: AC
  Administered 2016-09-09 – 2016-09-11 (×4): 1250 mg via INTRAVENOUS
  Filled 2016-09-09 (×4): qty 1250

## 2016-09-09 MED ORDER — HEMOSTATIC AGENTS (NO CHARGE) OPTIME
TOPICAL | Status: DC | PRN
  Administered 2016-09-09 (×4): 1 via TOPICAL

## 2016-09-09 MED ORDER — FENTANYL CITRATE (PF) 250 MCG/5ML IJ SOLN
INTRAMUSCULAR | Status: AC
  Filled 2016-09-09: qty 10

## 2016-09-09 MED ORDER — MAGNESIUM SULFATE 4 GM/100ML IV SOLN
4.0000 g | Freq: Once | INTRAVENOUS | Status: AC
  Administered 2016-09-09: 4 g via INTRAVENOUS

## 2016-09-09 MED ORDER — SODIUM CHLORIDE 0.9 % IV SOLN
20.0000 ug | INTRAVENOUS | Status: AC
  Administered 2016-09-09: 20 ug via INTRAVENOUS
  Filled 2016-09-09: qty 5

## 2016-09-09 MED ORDER — VANCOMYCIN HCL 1000 MG IV SOLR
INTRAVENOUS | Status: DC | PRN
  Administered 2016-09-09 (×2): 1000 mg

## 2016-09-09 MED ORDER — BISACODYL 5 MG PO TBEC
10.0000 mg | DELAYED_RELEASE_TABLET | Freq: Every day | ORAL | Status: DC
  Administered 2016-09-11 – 2016-09-25 (×15): 10 mg via ORAL
  Filled 2016-09-09 (×15): qty 2

## 2016-09-09 MED ORDER — MIDAZOLAM HCL 2 MG/2ML IJ SOLN
2.0000 mg | INTRAMUSCULAR | Status: DC | PRN
  Administered 2016-09-09 – 2016-09-10 (×8): 2 mg via INTRAVENOUS
  Filled 2016-09-09 (×9): qty 2

## 2016-09-09 MED ORDER — INSULIN ASPART 100 UNIT/ML ~~LOC~~ SOLN: 0.0000 [IU] | SUBCUTANEOUS | Status: DC

## 2016-09-09 MED ORDER — RIFAMPIN 300 MG PO CAPS
600.0000 mg | ORAL_CAPSULE | Freq: Once | ORAL | Status: AC
  Administered 2016-09-10: 600 mg via ORAL
  Filled 2016-09-09: qty 2

## 2016-09-09 MED ORDER — EPINEPHRINE PF 1 MG/ML IJ SOLN
0.0000 ug/min | INTRAVENOUS | Status: DC
  Administered 2016-09-10: 4 ug/min via INTRAVENOUS
  Administered 2016-09-10: 2 ug/min via INTRAVENOUS
  Filled 2016-09-09 (×2): qty 4

## 2016-09-09 MED ORDER — PROPOFOL 10 MG/ML IV BOLUS
INTRAVENOUS | Status: AC
  Filled 2016-09-09: qty 20

## 2016-09-09 MED ORDER — DEXMEDETOMIDINE HCL IN NACL 400 MCG/100ML IV SOLN
0.1000 ug/kg/h | INTRAVENOUS | Status: DC
  Filled 2016-09-09: qty 100

## 2016-09-09 MED ORDER — LACTATED RINGERS IV SOLN
INTRAVENOUS | Status: DC
  Administered 2016-09-10: 10 mL/h via INTRAVENOUS

## 2016-09-09 MED ORDER — VANCOMYCIN HCL IN DEXTROSE 1-5 GM/200ML-% IV SOLN
1000.0000 mg | Freq: Two times a day (BID) | INTRAVENOUS | Status: DC
  Filled 2016-09-09 (×2): qty 200

## 2016-09-09 MED ORDER — ARTIFICIAL TEARS OP OINT
TOPICAL_OINTMENT | OPHTHALMIC | Status: DC | PRN
  Administered 2016-09-09: 1 via OPHTHALMIC

## 2016-09-09 MED ORDER — CHLORHEXIDINE GLUCONATE 0.12% ORAL RINSE (MEDLINE KIT)
15.0000 mL | Freq: Two times a day (BID) | OROMUCOSAL | Status: DC
  Administered 2016-09-09 – 2016-09-10 (×2): 15 mL via OROMUCOSAL

## 2016-09-09 MED ORDER — FENTANYL CITRATE (PF) 100 MCG/2ML IJ SOLN
50.0000 ug | INTRAMUSCULAR | Status: DC | PRN
  Administered 2016-09-09 – 2016-09-11 (×18): 100 ug via INTRAVENOUS
  Filled 2016-09-09 (×18): qty 2

## 2016-09-09 MED ORDER — ACETAMINOPHEN 650 MG RE SUPP: 650.0000 mg | Freq: Once | RECTAL | Status: AC

## 2016-09-09 MED ORDER — METOCLOPRAMIDE HCL 5 MG/ML IJ SOLN
10.0000 mg | Freq: Four times a day (QID) | INTRAMUSCULAR | Status: AC
  Administered 2016-09-09 – 2016-09-14 (×20): 10 mg via INTRAVENOUS
  Filled 2016-09-09 (×20): qty 2

## 2016-09-09 MED ORDER — VANCOMYCIN HCL 1000 MG IV SOLR
INTRAVENOUS | Status: AC
Start: 2016-09-09 — End: 2016-09-09
  Filled 2016-09-09: qty 1000

## 2016-09-09 MED ORDER — FENTANYL CITRATE (PF) 250 MCG/5ML IJ SOLN
INTRAMUSCULAR | Status: DC | PRN
  Administered 2016-09-09 (×2): 100 ug via INTRAVENOUS
  Administered 2016-09-09 (×2): 150 ug via INTRAVENOUS
  Administered 2016-09-09: 250 ug via INTRAVENOUS
  Administered 2016-09-09: 50 ug via INTRAVENOUS
  Administered 2016-09-09: 150 ug via INTRAVENOUS
  Administered 2016-09-09: 200 ug via INTRAVENOUS
  Administered 2016-09-09: 100 ug via INTRAVENOUS

## 2016-09-09 MED ORDER — ALBUMIN HUMAN 5 % IV SOLN
12.5000 g | Freq: Four times a day (QID) | INTRAVENOUS | Status: DC
  Administered 2016-09-09: 12.5 g via INTRAVENOUS

## 2016-09-09 MED ORDER — SODIUM CHLORIDE 0.45 % IV SOLN
INTRAVENOUS | Status: DC | PRN
  Administered 2016-09-09: 20 mL/h via INTRAVENOUS
  Administered 2016-09-11: 05:00:00 via INTRAVENOUS

## 2016-09-09 MED ORDER — POTASSIUM CHLORIDE 10 MEQ/100ML IV SOLN
INTRAVENOUS | Status: DC | PRN
  Administered 2016-09-09 (×2): 10 meq via INTRAVENOUS

## 2016-09-09 MED ORDER — DEXMEDETOMIDINE HCL IN NACL 400 MCG/100ML IV SOLN
0.4000 ug/kg/h | INTRAVENOUS | Status: DC
  Administered 2016-09-09: 0.7 ug/kg/h via INTRAVENOUS
  Administered 2016-09-10 (×2): 1.2 ug/kg/h via INTRAVENOUS
  Filled 2016-09-09 (×3): qty 100

## 2016-09-09 MED ORDER — FLUCONAZOLE IN SODIUM CHLORIDE 400-0.9 MG/200ML-% IV SOLN
400.0000 mg | Freq: Once | INTRAVENOUS | Status: AC
  Administered 2016-09-10: 400 mg via INTRAVENOUS
  Filled 2016-09-09: qty 200

## 2016-09-09 MED ORDER — MIDAZOLAM HCL 5 MG/5ML IJ SOLN
INTRAMUSCULAR | Status: DC | PRN
  Administered 2016-09-09: 2 mg via INTRAVENOUS
  Administered 2016-09-09: 1 mg via INTRAVENOUS
  Administered 2016-09-09: 2 mg via INTRAVENOUS
  Administered 2016-09-09: 4 mg via INTRAVENOUS
  Administered 2016-09-09: 1 mg via INTRAVENOUS

## 2016-09-09 MED ORDER — ACETAMINOPHEN 160 MG/5ML PO SOLN
1000.0000 mg | Freq: Four times a day (QID) | ORAL | Status: AC
  Administered 2016-09-09 – 2016-09-10 (×2): 1000 mg
  Filled 2016-09-09 (×2): qty 40.6

## 2016-09-09 MED ORDER — HEPARIN SODIUM (PORCINE) 1000 UNIT/ML IJ SOLN
INTRAMUSCULAR | Status: DC | PRN
  Administered 2016-09-09: 30000 [IU] via INTRAVENOUS
  Administered 2016-09-09: 5000 [IU] via INTRAVENOUS

## 2016-09-09 MED ORDER — ACETAMINOPHEN 160 MG/5ML PO SOLN: 650.0000 mg | Freq: Once | ORAL | Status: AC

## 2016-09-09 MED ORDER — ETOMIDATE 2 MG/ML IV SOLN
INTRAVENOUS | Status: DC | PRN
  Administered 2016-09-09: 12 mg via INTRAVENOUS

## 2016-09-09 MED ORDER — LACTATED RINGERS IV SOLN
INTRAVENOUS | Status: DC | PRN
  Administered 2016-09-09: 07:00:00 via INTRAVENOUS

## 2016-09-09 MED ORDER — ALBUMIN HUMAN 5 % IV SOLN
INTRAVENOUS | Status: DC | PRN
  Administered 2016-09-09 (×2): via INTRAVENOUS
  Administered 2016-09-09: 12.5 g

## 2016-09-09 MED ORDER — FAMOTIDINE IN NACL 20-0.9 MG/50ML-% IV SOLN
20.0000 mg | Freq: Two times a day (BID) | INTRAVENOUS | Status: AC
  Administered 2016-09-10 (×2): 20 mg via INTRAVENOUS
  Filled 2016-09-09 (×2): qty 50

## 2016-09-09 MED ORDER — ALBUMIN HUMAN 5 % IV SOLN
12.5000 g | INTRAVENOUS | Status: DC | PRN
  Filled 2016-09-09: qty 250

## 2016-09-09 MED ORDER — NITROGLYCERIN IN D5W 200-5 MCG/ML-% IV SOLN: 0.0000 ug/min | INTRAVENOUS | Status: DC

## 2016-09-09 MED FILL — Magnesium Sulfate Inj 50%: INTRAMUSCULAR | Qty: 10 | Status: AC

## 2016-09-09 MED FILL — Sodium Bicarbonate IV Soln 8.4%: INTRAVENOUS | Qty: 50 | Status: AC

## 2016-09-09 MED FILL — Heparin Sodium (Porcine) Inj 1000 Unit/ML: INTRAMUSCULAR | Qty: 30 | Status: AC

## 2016-09-09 MED FILL — Albumin, Human Inj 5%: INTRAVENOUS | Qty: 250 | Status: AC

## 2016-09-09 MED FILL — Mannitol IV Soln 20%: INTRAVENOUS | Qty: 500 | Status: AC

## 2016-09-09 MED FILL — Heparin Sodium (Porcine) Inj 1000 Unit/ML: INTRAMUSCULAR | Qty: 20 | Status: AC

## 2016-09-09 MED FILL — Lidocaine HCl IV Inj 20 MG/ML: INTRAVENOUS | Qty: 10 | Status: AC

## 2016-09-09 MED FILL — Sodium Chloride IV Soln 0.9%: INTRAVENOUS | Qty: 4000 | Status: AC

## 2016-09-09 MED FILL — Calcium Chloride Inj 10%: INTRAVENOUS | Qty: 10 | Status: AC

## 2016-09-09 MED FILL — Electrolyte-R (PH 7.4) Solution: INTRAVENOUS | Qty: 5000 | Status: AC

## 2016-09-09 MED FILL — Potassium Chloride Inj 2 mEq/ML: INTRAVENOUS | Qty: 40 | Status: AC

## 2016-09-09 SURGICAL SUPPLY — 137 items
ADAPTER CARDIO PERF ANTE/RETRO (ADAPTER) IMPLANT
ADAPTER DLP PERFUSION .25INX2I (MISCELLANEOUS) ×4 IMPLANT
ANTEGRADE CPLG (MISCELLANEOUS) IMPLANT
APPLICATOR COTTON TIP 6IN STRL (MISCELLANEOUS) IMPLANT
ATTRACTOMAT 16X20 MAGNETIC DRP (DRAPES) IMPLANT
BAG DECANTER FOR FLEXI CONT (MISCELLANEOUS) ×12 IMPLANT
BLADE CORE FAN STRYKER (BLADE) ×4 IMPLANT
BLADE STERNUM SYSTEM 6 (BLADE) ×4 IMPLANT
BLADE SURG 12 STRL SS (BLADE) ×4 IMPLANT
BLADE SURG 15 STRL LF DISP TIS (BLADE) ×4 IMPLANT
BLADE SURG 15 STRL SS (BLADE) ×4
BOOT SUTURE AID YELLOW STND (SUTURE) IMPLANT
CANISTER SUCTION 2500CC (MISCELLANEOUS) ×4 IMPLANT
CANNULA AORTIC ROOT 20012 (MISCELLANEOUS) ×4 IMPLANT
CANNULA ARTERIAL NVNT 3/8 20FR (MISCELLANEOUS) ×8 IMPLANT
CANNULA GUNDRY RCSP 15FR (MISCELLANEOUS) IMPLANT
CANNULA MC2 2 STG 36/46 NON-V (CANNULA) ×2 IMPLANT
CANNULA SUMP PERICARDIAL (CANNULA) ×4 IMPLANT
CANNULA VENOUS 2 STG 34/46 (CANNULA) ×2
CANNULA VENOUS LOW PROF 34X46 (CANNULA) ×4 IMPLANT
CATH CPB KIT VANTRIGT (MISCELLANEOUS) IMPLANT
CATH FOLEY 2WAY SLVR  5CC 14FR (CATHETERS) ×2
CATH FOLEY 2WAY SLVR 5CC 14FR (CATHETERS) ×2 IMPLANT
CATH HEART VENT LEFT (CATHETERS) IMPLANT
CATH HYDRAGLIDE XL THORACIC (CATHETERS) ×4 IMPLANT
CATH RETROPLEGIA CORONARY 14FR (CATHETERS) IMPLANT
CATH ROBINSON RED A/P 18FR (CATHETERS) ×8 IMPLANT
CATH THORACIC 28FR (CATHETERS) IMPLANT
CATH THORACIC 36FR RT ANG (CATHETERS) ×4 IMPLANT
CHLORAPREP W/TINT 26ML (MISCELLANEOUS) IMPLANT
CLIP FOGARTY SPRING 6M (CLIP) IMPLANT
CONN 1/2X1/2X1/2  BEN (MISCELLANEOUS) ×2
CONN 1/2X1/2X1/2 BEN (MISCELLANEOUS) ×2 IMPLANT
CONN 3/8X1/2 ST GISH (MISCELLANEOUS) ×8 IMPLANT
CONN ST 1/4X3/8  BEN (MISCELLANEOUS) ×2
CONN ST 1/4X3/8 BEN (MISCELLANEOUS) ×2 IMPLANT
CONT SPEC 4OZ CLIKSEAL STRL BL (MISCELLANEOUS) ×4 IMPLANT
COVER SURGICAL LIGHT HANDLE (MISCELLANEOUS) IMPLANT
CRADLE DONUT ADULT HEAD (MISCELLANEOUS) ×4 IMPLANT
DRAIN CHANNEL 28F RND 3/8 FF (WOUND CARE) IMPLANT
DRAIN CHANNEL 32F RND 10.7 FF (WOUND CARE) ×4 IMPLANT
DRAPE BILATERAL SPLIT (DRAPES) ×4 IMPLANT
DRAPE CV SPLIT W-CLR ANES SCRN (DRAPES) ×4 IMPLANT
DRAPE INCISE IOBAN 66X45 STRL (DRAPES) ×8 IMPLANT
DRAPE SLUSH/WARMER DISC (DRAPES) ×4 IMPLANT
DRSG AQUACEL AG ADV 3.5X14 (GAUZE/BANDAGES/DRESSINGS) ×4 IMPLANT
ELECT BLADE 4.0 EZ CLEAN MEGAD (MISCELLANEOUS) ×4
ELECT BLADE 6.5 EXT (BLADE) ×4 IMPLANT
ELECT CAUTERY BLADE 6.4 (BLADE) ×4 IMPLANT
ELECT REM PT RETURN 9FT ADLT (ELECTROSURGICAL) ×4
ELECTRODE BLDE 4.0 EZ CLN MEGD (MISCELLANEOUS) ×2 IMPLANT
ELECTRODE REM PT RTRN 9FT ADLT (ELECTROSURGICAL) ×2 IMPLANT
FELT TEFLON 1X6 (MISCELLANEOUS) ×8 IMPLANT
FELT TEFLON 6X6 (MISCELLANEOUS) IMPLANT
GAUZE SPONGE 4X4 12PLY STRL (GAUZE/BANDAGES/DRESSINGS) ×4 IMPLANT
GLOVE BIO SURGEON STRL SZ 6 (GLOVE) IMPLANT
GLOVE BIO SURGEON STRL SZ 6.5 (GLOVE) ×12 IMPLANT
GLOVE BIO SURGEON STRL SZ7 (GLOVE) IMPLANT
GLOVE BIO SURGEON STRL SZ7.5 (GLOVE) ×12 IMPLANT
GLOVE BIO SURGEONS STRL SZ 6.5 (GLOVE) ×4
GOWN STRL REUS W/ TWL LRG LVL3 (GOWN DISPOSABLE) ×16 IMPLANT
GOWN STRL REUS W/ TWL XL LVL3 (GOWN DISPOSABLE) ×4 IMPLANT
GOWN STRL REUS W/TWL LRG LVL3 (GOWN DISPOSABLE) ×16
GOWN STRL REUS W/TWL XL LVL3 (GOWN DISPOSABLE) ×4
HEMOSTAT POWDER SURGIFOAM 1G (HEMOSTASIS) ×20 IMPLANT
HEMOSTAT SURGICEL 2X14 (HEMOSTASIS) ×4 IMPLANT
INSERT FOGARTY XLG (MISCELLANEOUS) IMPLANT
KIT BASIN OR (CUSTOM PROCEDURE TRAY) ×4 IMPLANT
KIT LVAD HEARTMATE 3 W-CNTRL (Prosthesis & Implant Heart) ×2 IMPLANT
KIT LVAD HEARTMATE III W-CNTRL (Prosthesis & Implant Heart) ×2 IMPLANT
KIT ROOM TURNOVER OR (KITS) ×4 IMPLANT
KIT SUCTION CATH 14FR (SUCTIONS) ×4 IMPLANT
LEAD PACING MYOCARDI (MISCELLANEOUS) ×4 IMPLANT
LINE VENT (MISCELLANEOUS) ×4 IMPLANT
NS IRRIG 1000ML POUR BTL (IV SOLUTION) ×24 IMPLANT
PACK OPEN HEART (CUSTOM PROCEDURE TRAY) ×4 IMPLANT
PAD ARMBOARD 7.5X6 YLW CONV (MISCELLANEOUS) ×16 IMPLANT
PAD DEFIB R2 (MISCELLANEOUS) ×4 IMPLANT
PEDIATRIC SUCKERS (MISCELLANEOUS) ×4 IMPLANT
PUNCH AORTIC ROTATE  4.5MM 8IN (MISCELLANEOUS) ×4 IMPLANT
PUNCH AORTIC ROTATE 4.5MM 8IN (MISCELLANEOUS) ×4 IMPLANT
SEALANT SURG COSEAL 8ML (VASCULAR PRODUCTS) ×4 IMPLANT
SENSOR MYOCARDIAL TEMP (MISCELLANEOUS) ×4 IMPLANT
SET CARDIOPLEGIA MPS 5001102 (MISCELLANEOUS) ×4 IMPLANT
SHEATH AVANTI 11CM 5FR (MISCELLANEOUS) ×8 IMPLANT
SPONGE DRAIN TRACH 4X4 STRL 2S (GAUZE/BANDAGES/DRESSINGS) ×4 IMPLANT
STOPCOCK 4 WAY LG BORE MALE ST (IV SETS) ×8 IMPLANT
SUCKER INTRACARDIAC WEIGHTED (SUCKER) ×4 IMPLANT
SUCKER WEIGHTED FLEX (MISCELLANEOUS) ×4 IMPLANT
SURGIFLO W/THROMBIN 8M KIT (HEMOSTASIS) ×8 IMPLANT
SUT ETHIBOND 2 0 SH (SUTURE) ×10 IMPLANT
SUT ETHIBOND 2 0 SH 36X2 (SUTURE) ×10 IMPLANT
SUT ETHIBOND 2 0 V4 (SUTURE) IMPLANT
SUT ETHIBOND 2 0V4 GREEN (SUTURE) IMPLANT
SUT ETHIBOND 4 0 TF (SUTURE) IMPLANT
SUT ETHIBOND 5 0 C 1 30 (SUTURE) IMPLANT
SUT ETHIBOND 5 LR DA (SUTURE) ×8 IMPLANT
SUT ETHIBOND NAB MH 2-0 36IN (SUTURE) ×64 IMPLANT
SUT ETHILON 3 0 PS 1 (SUTURE) ×4 IMPLANT
SUT PROLENE 3 0 RB 1 (SUTURE) IMPLANT
SUT PROLENE 3 0 SH 1 (SUTURE) ×4 IMPLANT
SUT PROLENE 3 0 SH DA (SUTURE) ×8 IMPLANT
SUT PROLENE 4 0 RB 1 (SUTURE) ×24
SUT PROLENE 4 0 SH DA (SUTURE) ×12 IMPLANT
SUT PROLENE 4-0 RB1 .5 CRCL 36 (SUTURE) ×24 IMPLANT
SUT PROLENE 5 0 C1 (SUTURE) IMPLANT
SUT PROLENE 6 0 C 1 30 (SUTURE) ×4 IMPLANT
SUT SILK  1 MH (SUTURE) ×8
SUT SILK 1 MH (SUTURE) ×8 IMPLANT
SUT SILK 1 TIES 10X30 (SUTURE) ×8 IMPLANT
SUT SILK 2 0 SH CR/8 (SUTURE) ×12 IMPLANT
SUT STEEL 6MS V (SUTURE) ×4 IMPLANT
SUT STEEL STERNAL CCS#1 18IN (SUTURE) ×4 IMPLANT
SUT STEEL SZ 6 DBL 3X14 BALL (SUTURE) ×8 IMPLANT
SUT TEM PAC WIRE 2 0 SH (SUTURE) IMPLANT
SUT VIC AB 1 CTX 18 (SUTURE) ×12 IMPLANT
SUT VIC AB 1 CTX 36 (SUTURE) ×6
SUT VIC AB 1 CTX36XBRD ANBCTR (SUTURE) ×6 IMPLANT
SUT VIC AB 2-0 CTX 27 (SUTURE) ×8 IMPLANT
SUT VIC AB 3-0 SH 8-18 (SUTURE) ×4 IMPLANT
SUT VIC AB 3-0 X1 27 (SUTURE) ×12 IMPLANT
SUT VICRYL 2 TP 1 (SUTURE) IMPLANT
SYR 50ML LL SCALE MARK (SYRINGE) ×4 IMPLANT
SYSTEM SAHARA CHEST DRAIN ATS (WOUND CARE) ×4 IMPLANT
TOWEL OR 17X24 6PK STRL BLUE (TOWEL DISPOSABLE) ×8 IMPLANT
TOWEL OR 17X26 10 PK STRL BLUE (TOWEL DISPOSABLE) ×8 IMPLANT
TRAY CATH LUMEN 1 20CM STRL (SET/KITS/TRAYS/PACK) ×4 IMPLANT
TRAY FOLEY IC TEMP SENS 14FR (CATHETERS) ×4 IMPLANT
TRAY FOLEY IC TEMP SENS 16FR (CATHETERS) ×4 IMPLANT
TUBE CONNECTING 12'X1/4 (SUCTIONS) ×1
TUBE CONNECTING 12X1/4 (SUCTIONS) ×3 IMPLANT
TUBE SUCT INTRACARD DLP 20F (MISCELLANEOUS) ×4 IMPLANT
TUBING ART PRESS 48 MALE/FEM (TUBING) ×8 IMPLANT
UNDERPAD 30X30 (UNDERPADS AND DIAPERS) ×4 IMPLANT
VENT LEFT HEART 12002 (CATHETERS)
WATER STERILE IRR 1000ML POUR (IV SOLUTION) ×8 IMPLANT
YANKAUER SUCT BULB TIP NO VENT (SUCTIONS) ×4 IMPLANT

## 2016-09-09 NOTE — Progress Notes (Signed)
CSW met with family in the cafeteria where family were waiting to offer support. Patient's mother and 2 sisters present and asking appropriate questions about surgery and recovery. CSW gave family update from the OR on patient status and discussed OR to ICU (2S) post implant. CSW offered support and will continue to follow throughout implant hospitalization. Raquel Sarna, LCSW [phone]

## 2016-09-09 NOTE — Progress Notes (Signed)
  Echocardiogram Echocardiogram Transesophageal has been performed.  Ronald Miller 09/09/2016, 9:27 AM

## 2016-09-09 NOTE — Anesthesia Procedure Notes (Signed)
Procedure Name: Intubation Date/Time: 09/09/2016 7:57 AM Performed by: Clearnce Sorrel Pre-anesthesia Checklist: Patient identified, Emergency Drugs available, Suction available, Patient being monitored and Timeout performed Patient Re-evaluated:Patient Re-evaluated prior to inductionOxygen Delivery Method: Circle system utilized Preoxygenation: Pre-oxygenation with 100% oxygen Intubation Type: IV induction Ventilation: Oral airway inserted - appropriate to patient size and Two handed mask ventilation required Laryngoscope Size: Glidescope Grade View: Grade I Tube type: Oral Tube size: 8.0 mm Number of attempts: 1 Airway Equipment and Method: Stylet Placement Confirmation: ETT inserted through vocal cords under direct vision,  positive ETCO2,  CO2 detector and breath sounds checked- equal and bilateral Secured at: 23 cm Tube secured with: Tape Dental Injury: Teeth and Oropharynx as per pre-operative assessment

## 2016-09-09 NOTE — Progress Notes (Signed)
The patient was examined and preop studies reviewed. There has been no change from the prior exam and the patient is ready for surgery.  plan implantation of LVAD on M Drotar for advanced heart Failure

## 2016-09-09 NOTE — Progress Notes (Signed)
Vancomycin per Pharmacy for Surgical Prophylaxis post LVAD  60 YO M s/p LVAD insertion, Pharmacy is consulted to start vancomycin for surgical prophylaxis for 48 hrs. Scr ~ 1 at baseline, est. crcl >90 ml/min. He received pre-op dose vancomycin 1500mg  at ~ 0815. Anesthesia stop time = 1615  Plan: Vancomycin mg 1250 at 2000 x . Monitor renal function  Thanks.   Maryanna Shape, PharmD, BCPS  Clinical Pharmacist  Pager: [phone]

## 2016-09-09 NOTE — Anesthesia Procedure Notes (Signed)
Central Venous Catheter Insertion Performed by: anesthesiologist 09/09/2016 6:50 AM Patient location: OR. Preanesthetic checklist: patient identified, IV checked, site marked, risks and benefits discussed, surgical consent, monitors and equipment checked, pre-op evaluation, timeout performed and anesthesia consent Position: Trendelenburg Lidocaine 1% used for infiltration Landmarks identified and Seldinger technique used Catheter size: 8.5 Fr PA cath was placed.Swan type and PA catheter depth:thermodilationProcedure performed using ultrasound guided technique. Attempts: 1 Patient tolerated the procedure well with no immediate complications.

## 2016-09-09 NOTE — Anesthesia Procedure Notes (Signed)
Central Venous Catheter Insertion Performed by: anesthesiologist 09/09/2016 6:50 AM Patient location: OR. Preanesthetic checklist: patient identified, IV checked, site marked, risks and benefits discussed, surgical consent, monitors and equipment checked, pre-op evaluation and timeout performed Position: Trendelenburg Landmarks identified and Seldinger technique used Catheter size: 8 Fr Central line was placed.Double lumen Attempts: 1 Following insertion, line sutured, dressing applied and Biopatch. Post procedure assessment: blood return through all ports, free fluid flow and no air. Patient tolerated the procedure well with no immediate complications.

## 2016-09-09 NOTE — Progress Notes (Signed)
Pt transported to OR holding, report called to anesthesia, all personal belongings taken to 2S  3 white personal belonging bags placed on floor in front of bins and one black book bag placed inside bin. Pt's glasses were wrapped in washcloths and placed in black book bag. There was a tablet and cell phone also placed inside black book bag. Pt was aware prior to transport

## 2016-09-09 NOTE — Transfer of Care (Signed)
Immediate Anesthesia Transfer of Care Note  Patient: Ronald Miller.  Procedure(s) Performed: Procedure(s) with comments: REDO STERNOTOMY (N/A) INSERTION OF IMPLANTABLE LEFT VENTRICULAR ASSIST DEVICE (N/A) - HeartMate 3 CIRC ARREST  NITRIC OXIDE TRANSESOPHAGEAL ECHOCARDIOGRAM (TEE) (N/A)  Patient Location: SICU  Anesthesia Type:General  Level of Consciousness: Patient remains intubated per anesthesia plan  Airway & Oxygen Therapy: Patient remains intubated per anesthesia plan  Post-op Assessment: Report given to RN and Post -op Vital signs reviewed and stable  Post vital signs: Reviewed and stable  Last Vitals:  Vitals:   09/09/16 0500 09/09/16 1557  BP: 99/79 109/85  Pulse: 90 (!) 110  Resp:  12  Temp: 36.4 C     Last Pain:  Vitals:   09/09/16 0500  TempSrc: Oral  PainSc:          Complications: No apparent anesthesia complications

## 2016-09-09 NOTE — Progress Notes (Signed)
Vent changes made by Dr. Prescott Gum.    Do not wean Nitric at this time, per Dr. Prescott Gum.

## 2016-09-09 NOTE — Brief Op Note (Signed)
08/22/2016 - 09/09/2016  1:48 PM  PATIENT:  Ronald Miller.  60 y.o. male  PRE-OPERATIVE DIAGNOSIS:  ICM  POST-OPERATIVE DIAGNOSIS:  ICM  PROCEDURE:  Procedure(s) with comments: REDO STERNOTOMY (N/A) INSERTION OF IMPLANTABLE LEFT VENTRICULAR ASSIST DEVICE (N/A) - HeartMate 3 CIRC ARREST  NITRIC OXIDE TRANSESOPHAGEAL ECHOCARDIOGRAM (TEE) (N/A)  SURGEON:  Surgeon(s) and Role:    * [name], MD - Primary    * Gaye Pollack, MD - Assisting  PHYSICIAN ASSISTANT: Lodie Waheed PA-C  ANESTHESIA:   general  EBL:  Total I/O In: -  Out: [id number] [Urine:1450]  BLOOD ADMINISTERED:1 CC PRBC, 2 FFP and 2 PLTS  DRAINS: 3 Chest Tube(s) in the MEDIASTINUM AND LEFT PLEURAL SPACE   LOCAL MEDICATIONS USED:  NONE  SPECIMEN:  No Specimen  DISPOSITION OF SPECIMEN:  N/A  COUNTS:  YES  TOURNIQUET:  * No tourniquets in log *  DICTATION: .Other Dictation: Dictation Number PENDING  PLAN OF CARE: Admit to inpatient   PATIENT DISPOSITION:  ICU - intubated and hemodynamically stable.   Delay start of Pharmacological VTE agent (>24hrs) due to surgical blood loss or risk of bleeding: yes  COMPLICATIONS: NO KNOWN

## 2016-09-09 NOTE — OR Nursing (Signed)
Twenty minute call to SICU charge nurse at 1507. Going to room 12.

## 2016-09-09 NOTE — Progress Notes (Signed)
Patient ID: Ronald Miller., male   DOB: [birth year], 60 y.o.   MRN: [id number]   SICU Evening Rounds:   Hemodynamically stable  CI = 2.8 on milrinone 0.3, levophed 6, epi 3, NO 30 ppm  Pump parameters stable with flow 5.0 on 5600 rpm.   Urine output good  CT output low  CBC    Component Value Date/Time   WBC 5.8 09/09/2016 0450   RBC 4.05 (L) 09/09/2016 0450   HGB 8.8 (L) 09/09/2016 1609   HGB 13.6 04/09/2014 0848   HCT 26.0 (L) 09/09/2016 1609   HCT 41.2 04/09/2014 0848   PLT 95 (L) 09/09/2016 1618   PLT 162 04/09/2014 0848   MCV 89.6 09/09/2016 0450   MCV 92 04/09/2014 0848   MCH 30.9 09/09/2016 0450   MCHC 34.4 09/09/2016 0450   RDW 12.9 09/09/2016 0450   RDW 14.4 04/09/2014 0848   LYMPHSABS 1.2 06/17/2016 1600   LYMPHSABS 2.2 03/30/2014 0509   MONOABS 0.6 06/17/2016 1600   MONOABS 0.6 03/30/2014 0509   EOSABS 0.1 06/17/2016 1600   EOSABS 0.1 03/30/2014 0509   BASOSABS 0.0 06/17/2016 1600   BASOSABS 0.0 03/30/2014 0509     BMET    Component Value Date/Time   NA 137 09/09/2016 1618   NA 136 04/09/2014 0848   K 3.4 (L) 09/09/2016 1618   K 3.9 04/09/2014 0848   CL 104 09/09/2016 1618   CL 100 04/09/2014 0848   CO2 26 09/09/2016 1618   CO2 32 04/09/2014 0848   GLUCOSE 137 (H) 09/09/2016 1618   GLUCOSE 191 (H) 04/09/2014 0848   BUN 11 09/09/2016 1618   BUN 12 04/09/2014 0848   CREATININE 0.94 09/09/2016 1618   CREATININE 1.07 04/09/2014 0848   CALCIUM 8.3 (L) 09/09/2016 1618   CALCIUM 8.9 04/09/2014 0848   GFRNONAA >60 09/09/2016 1618   GFRNONAA >60 04/09/2014 0848   GFRAA >60 09/09/2016 1618   GFRAA >60 04/09/2014 0848     A/P:  Stable postop course. Continue current plans. Wean NO overnight for vent wean in am.

## 2016-09-09 NOTE — OR Nursing (Signed)
Forty-five minute call to SICU charge nurse at 1355.

## 2016-09-10 ENCOUNTER — Inpatient Hospital Stay (HOSPITAL_COMMUNITY): Payer: Medicare HMO

## 2016-09-10 DIAGNOSIS — Z95811 Presence of heart assist device: Secondary | ICD-10-CM

## 2016-09-10 LAB — POCT I-STAT 3, ART BLOOD GAS (G3+)
ACID-BASE DEFICIT: 1 mmol/L (ref 0.0–2.0)
ACID-BASE DEFICIT: 2 mmol/L (ref 0.0–2.0)
ACID-BASE DEFICIT: 1 mmol/L (ref 0.0–2.0)
Acid-base deficit: 18 mmol/L — ABNORMAL HIGH (ref 0.0–2.0)
BICARBONATE: 7.8 mmol/L — AB (ref 20.0–28.0)
Bicarbonate: 23.7 mmol/L (ref 20.0–28.0)
Bicarbonate: 22.8 mmol/L (ref 20.0–28.0)
Bicarbonate: 23.9 mmol/L (ref 20.0–28.0)
O2 SAT: 95 %
O2 SAT: 97 %
O2 SAT: 98 %
O2 Saturation: 97 %
PCO2 ART: 41.8 mmHg (ref 32.0–48.0)
PH ART: 7.38 (ref 7.350–7.450)
PO2 ART: 94 mmHg (ref 83.0–108.0)
TCO2: 25 mmol/L (ref 0–100)
TCO2: 24 mmol/L (ref 0–100)
TCO2: 25 mmol/L (ref 0–100)
TCO2: 8 mmol/L (ref 0–100)
pCO2 arterial: 40.3 mmHg (ref 32.0–48.0)
pCO2 arterial: 16.4 mmHg — CL (ref 32.0–48.0)
pCO2 arterial: 41.9 mmHg (ref 32.0–48.0)
pH, Arterial: 7.348 — ABNORMAL LOW (ref 7.350–7.450)
pH, Arterial: 7.285 — ABNORMAL LOW (ref 7.350–7.450)
pH, Arterial: 7.364 (ref 7.350–7.450)
pO2, Arterial: 95 mmHg (ref 83.0–108.0)
pO2, Arterial: 82 mmHg — ABNORMAL LOW (ref 83.0–108.0)
pO2, Arterial: 123 mmHg — ABNORMAL HIGH (ref 83.0–108.0)

## 2016-09-10 LAB — PREPARE CRYOPRECIPITATE
UNIT DIVISION: 0
UNIT DIVISION: 0

## 2016-09-10 LAB — POCT I-STAT EG7
Acid-Base Excess: 1 mmol/L (ref 0.0–2.0)
BICARBONATE: 29 mmol/L — AB (ref 20.0–28.0)
Bicarbonate: 26.7 mmol/L (ref 20.0–28.0)
CALCIUM ION: 1.08 mmol/L — AB (ref 1.15–1.40)
Calcium, Ion: 1.21 mmol/L (ref 1.15–1.40)
HCT: 35 % — ABNORMAL LOW (ref 39.0–52.0)
HEMATOCRIT: 26 % — AB (ref 39.0–52.0)
HEMOGLOBIN: 11.9 g/dL — AB (ref 13.0–17.0)
HEMOGLOBIN: 8.8 g/dL — AB (ref 13.0–17.0)
O2 Saturation: 86 %
O2 Saturation: 67 %
PCO2 VEN: 58.1 mmHg (ref 44.0–60.0)
PCO2 VEN: 55 mmHg (ref 44.0–60.0)
PH VEN: 7.296 (ref 7.250–7.430)
PO2 VEN: 55 mmHg — AB (ref 32.0–45.0)
POTASSIUM: 3.2 mmol/L — AB (ref 3.5–5.1)
Potassium: 3.3 mmol/L — ABNORMAL LOW (ref 3.5–5.1)
Sodium: 139 mmol/L (ref 135–145)
Sodium: 141 mmol/L (ref 135–145)
TCO2: 31 mmol/L (ref 0–100)
TCO2: 28 mmol/L (ref 0–100)
pH, Ven: 7.299 (ref 7.250–7.430)
pO2, Ven: 40 mmHg (ref 32.0–45.0)

## 2016-09-10 LAB — COMPREHENSIVE METABOLIC PANEL
ALT: 23 U/L (ref 17–63)
AST: 114 U/L — ABNORMAL HIGH (ref 15–41)
Albumin: 3.8 g/dL (ref 3.5–5.0)
Alkaline Phosphatase: 44 U/L (ref 38–126)
Anion gap: 10 (ref 5–15)
BUN: 11 mg/dL (ref 6–20)
CO2: 21 mmol/L — ABNORMAL LOW (ref 22–32)
Calcium: 8.4 mg/dL — ABNORMAL LOW (ref 8.9–10.3)
Chloride: 107 mmol/L (ref 101–111)
Creatinine, Ser: 1.08 mg/dL (ref 0.61–1.24)
GFR calc Af Amer: >60 mL/min (ref >60)
GFR calc non Af Amer: >60 mL/min (ref >60)
Glucose, Bld: 171 mg/dL — ABNORMAL HIGH (ref 65–99)
Potassium: 4.2 mmol/L (ref 3.5–5.1)
Sodium: 138 mmol/L (ref 135–145)
Total Bilirubin: 2.2 mg/dL — ABNORMAL HIGH (ref 0.3–1.2)
Total Protein: 5.4 g/dL — ABNORMAL LOW (ref 6.5–8.1)

## 2016-09-10 LAB — MAGNESIUM
Magnesium: 2.2 mg/dL (ref 1.7–2.4)
Magnesium: 2 mg/dL (ref 1.7–2.4)

## 2016-09-10 LAB — GLUCOSE, CAPILLARY
GLUCOSE-CAPILLARY: 107 mg/dL — AB (ref 65–99)
GLUCOSE-CAPILLARY: 115 mg/dL — AB (ref 65–99)
GLUCOSE-CAPILLARY: 116 mg/dL — AB (ref 65–99)
GLUCOSE-CAPILLARY: 131 mg/dL — AB (ref 65–99)
GLUCOSE-CAPILLARY: 144 mg/dL — AB (ref 65–99)
GLUCOSE-CAPILLARY: 154 mg/dL — AB (ref 65–99)
GLUCOSE-CAPILLARY: 165 mg/dL — AB (ref 65–99)
GLUCOSE-CAPILLARY: 171 mg/dL — AB (ref 65–99)
GLUCOSE-CAPILLARY: 175 mg/dL — AB (ref 65–99)
GLUCOSE-CAPILLARY: 222 mg/dL — AB (ref 65–99)
GLUCOSE-CAPILLARY: 222 mg/dL — AB (ref 65–99)
Glucose-Capillary: 122 mg/dL — ABNORMAL HIGH (ref 65–99)
Glucose-Capillary: 145 mg/dL — ABNORMAL HIGH (ref 65–99)
Glucose-Capillary: 172 mg/dL — ABNORMAL HIGH (ref 65–99)
Glucose-Capillary: 190 mg/dL — ABNORMAL HIGH (ref 65–99)

## 2016-09-10 LAB — PREPARE FRESH FROZEN PLASMA
UNIT DIVISION: 0
Unit division: 0
Unit division: 0
Unit division: 0
Unit division: 0

## 2016-09-10 LAB — PREPARE PLATELET PHERESIS
UNIT DIVISION: 0
Unit division: 0
Unit division: 0

## 2016-09-10 LAB — CBC
HCT: 27.3 % — ABNORMAL LOW (ref 39.0–52.0)
HCT: 27 % — ABNORMAL LOW (ref 39.0–52.0)
Hemoglobin: 9.4 g/dL — ABNORMAL LOW (ref 13.0–17.0)
Hemoglobin: 9.4 g/dL — ABNORMAL LOW (ref 13.0–17.0)
MCH: 30.2 pg (ref 26.0–34.0)
MCH: 31.1 pg (ref 26.0–34.0)
MCHC: 34.4 g/dL (ref 30.0–36.0)
MCHC: 34.8 g/dL (ref 30.0–36.0)
MCV: 87.8 fL (ref 78.0–100.0)
MCV: 89.4 fL (ref 78.0–100.0)
Platelets: 95 10*3/uL — ABNORMAL LOW (ref 150–400)
Platelets: 82 10*3/uL — ABNORMAL LOW (ref 150–400)
RBC: 3.11 MIL/uL — ABNORMAL LOW (ref 4.22–5.81)
RBC: 3.02 MIL/uL — ABNORMAL LOW (ref 4.22–5.81)
RDW: 14.1 % (ref 11.5–15.5)
RDW: 14.5 % (ref 11.5–15.5)
WBC: 14.7 10*3/uL — ABNORMAL HIGH (ref 4.0–10.5)
WBC: 15.6 10*3/uL — ABNORMAL HIGH (ref 4.0–10.5)

## 2016-09-10 LAB — POCT I-STAT, CHEM 8
BUN: 12 mg/dL (ref 6–20)
BUN: 12 mg/dL (ref 6–20)
BUN: 14 mg/dL (ref 6–20)
CALCIUM ION: 1.15 mmol/L (ref 1.15–1.40)
CHLORIDE: 104 mmol/L (ref 101–111)
CREATININE: 1.1 mg/dL (ref 0.61–1.24)
Calcium, Ion: 1.21 mmol/L (ref 1.15–1.40)
Calcium, Ion: 1.17 mmol/L (ref 1.15–1.40)
Chloride: 106 mmol/L (ref 101–111)
Chloride: 104 mmol/L (ref 101–111)
Creatinine, Ser: 0.9 mg/dL (ref 0.61–1.24)
Creatinine, Ser: 0.9 mg/dL (ref 0.61–1.24)
Glucose, Bld: 175 mg/dL — ABNORMAL HIGH (ref 65–99)
Glucose, Bld: 120 mg/dL — ABNORMAL HIGH (ref 65–99)
Glucose, Bld: 216 mg/dL — ABNORMAL HIGH (ref 65–99)
HCT: 25 % — ABNORMAL LOW (ref 39.0–52.0)
HEMATOCRIT: 27 % — AB (ref 39.0–52.0)
HEMATOCRIT: 26 % — AB (ref 39.0–52.0)
HEMOGLOBIN: 9.2 g/dL — AB (ref 13.0–17.0)
HEMOGLOBIN: 8.8 g/dL — AB (ref 13.0–17.0)
Hemoglobin: 8.5 g/dL — ABNORMAL LOW (ref 13.0–17.0)
POTASSIUM: 4.3 mmol/L (ref 3.5–5.1)
POTASSIUM: 4.4 mmol/L (ref 3.5–5.1)
Potassium: 4.4 mmol/L (ref 3.5–5.1)
SODIUM: 141 mmol/L (ref 135–145)
SODIUM: 138 mmol/L (ref 135–145)
Sodium: 141 mmol/L (ref 135–145)
TCO2: 22 mmol/L (ref 0–100)
TCO2: 23 mmol/L (ref 0–100)
TCO2: 23 mmol/L (ref 0–100)

## 2016-09-10 LAB — CBC WITH DIFFERENTIAL/PLATELET
Basophils Absolute: 0 10*3/uL (ref 0.0–0.1)
Basophils Relative: 0 %
Eosinophils Absolute: 0 10*3/uL (ref 0.0–0.7)
Eosinophils Relative: 0 %
HCT: 27.9 % — ABNORMAL LOW (ref 39.0–52.0)
Hemoglobin: 9.5 g/dL — ABNORMAL LOW (ref 13.0–17.0)
Lymphocytes Relative: 3 %
Lymphs Abs: 0.6 10*3/uL — ABNORMAL LOW (ref 0.7–4.0)
MCH: 31.3 pg (ref 26.0–34.0)
MCHC: 35.1 g/dL (ref 30.0–36.0)
MCV: 89.1 fL (ref 78.0–100.0)
Monocytes Absolute: 1.3 10*3/uL — ABNORMAL HIGH (ref 0.1–1.0)
Monocytes Relative: 8 %
Neutro Abs: 15.1 10*3/uL — ABNORMAL HIGH (ref 1.7–7.7)
Neutrophils Relative %: 89 %
Platelets: 109 10*3/uL — ABNORMAL LOW (ref 150–400)
RBC: 3.13 MIL/uL — ABNORMAL LOW (ref 4.22–5.81)
RDW: 13.7 % (ref 11.5–15.5)
WBC: 16.9 10*3/uL — ABNORMAL HIGH (ref 4.0–10.5)

## 2016-09-10 LAB — BLOOD GAS, ARTERIAL
Acid-base deficit: 3 mmol/L — ABNORMAL HIGH (ref 0.0–2.0)
Bicarbonate: 22 mmol/L (ref 20.0–28.0)
Drawn by: 418751
FIO2: 50
MECHVT: 600 mL
Nitric Oxide: 15
O2 Saturation: 98.1 %
PEEP: 5 cmH2O
Patient temperature: 98.6
RATE: 16 resp/min
pCO2 arterial: 42.9 mmHg (ref 32.0–48.0)
pH, Arterial: 7.33 — ABNORMAL LOW (ref 7.350–7.450)
pO2, Arterial: 106 mmHg (ref 83.0–108.0)

## 2016-09-10 LAB — LACTATE DEHYDROGENASE: LDH: 377 U/L — ABNORMAL HIGH (ref 98–192)

## 2016-09-10 LAB — CALCIUM, IONIZED: Calcium, Ionized, Serum: 4.8 mg/dL (ref 4.5–5.6)

## 2016-09-10 LAB — HEMOGLOBIN A1C
Hgb A1c MFr Bld: 8.1 % — ABNORMAL HIGH (ref 4.8–5.6)
Mean Plasma Glucose: 186 mg/dL

## 2016-09-10 LAB — BRAIN NATRIURETIC PEPTIDE: B Natriuretic Peptide: 1322.6 pg/mL — ABNORMAL HIGH (ref 0.0–100.0)

## 2016-09-10 LAB — PROTIME-INR
INR: 1.06
Prothrombin Time: 13.8 seconds (ref 11.4–15.2)

## 2016-09-10 LAB — CREATININE, SERUM
Creatinine, Ser: 1.2 mg/dL (ref 0.61–1.24)
GFR calc Af Amer: >60 mL/min (ref >60)
GFR calc non Af Amer: >60 mL/min (ref >60)

## 2016-09-10 LAB — PHOSPHORUS: Phosphorus: 3 mg/dL (ref 2.5–4.6)

## 2016-09-10 LAB — COOXEMETRY PANEL
Carboxyhemoglobin: 1.9 % — ABNORMAL HIGH (ref 0.5–1.5)
Methemoglobin: 1.2 % (ref 0.0–1.5)
O2 Saturation: 74.6 %
Total hemoglobin: 9.5 g/dL — ABNORMAL LOW (ref 12.0–16.0)

## 2016-09-10 MED ORDER — FUROSEMIDE 10 MG/ML IJ SOLN
40.0000 mg | Freq: Once | INTRAMUSCULAR | Status: AC
  Administered 2016-09-10: 40 mg via INTRAVENOUS
  Filled 2016-09-10: qty 4

## 2016-09-10 MED ORDER — FENTANYL 25 MCG/HR TD PT72
50.0000 ug | MEDICATED_PATCH | TRANSDERMAL | Status: DC
  Administered 2016-09-10: 50 ug via TRANSDERMAL
  Filled 2016-09-10: qty 2

## 2016-09-10 MED ORDER — KETOROLAC TROMETHAMINE 15 MG/ML IJ SOLN
15.0000 mg | Freq: Four times a day (QID) | INTRAMUSCULAR | Status: AC | PRN
  Administered 2016-09-10 – 2016-09-11 (×2): 15 mg via INTRAVENOUS
  Filled 2016-09-10 (×2): qty 1

## 2016-09-10 MED ORDER — KETOROLAC TROMETHAMINE 15 MG/ML IJ SOLN: 15.0000 mg | Freq: Four times a day (QID) | INTRAMUSCULAR | Status: DC | PRN

## 2016-09-10 MED ORDER — INSULIN DETEMIR 100 UNIT/ML ~~LOC~~ SOLN
27.0000 [IU] | Freq: Two times a day (BID) | SUBCUTANEOUS | Status: DC
  Administered 2016-09-10 – 2016-09-13 (×5): 27 [IU] via SUBCUTANEOUS
  Filled 2016-09-10 (×8): qty 0.27

## 2016-09-10 MED ORDER — WARFARIN - PHYSICIAN DOSING INPATIENT
Freq: Every day | Status: DC
  Administered 2016-09-11 – 2016-09-12 (×2): 1

## 2016-09-10 MED ORDER — KETOROLAC TROMETHAMINE 15 MG/ML IJ SOLN
15.0000 mg | Freq: Once | INTRAMUSCULAR | Status: AC
  Administered 2016-09-10: 15 mg via INTRAVENOUS
  Filled 2016-09-10: qty 1

## 2016-09-10 MED ORDER — INSULIN DETEMIR 100 UNIT/ML ~~LOC~~ SOLN
22.0000 [IU] | Freq: Two times a day (BID) | SUBCUTANEOUS | Status: DC
  Administered 2016-09-10: 22 [IU] via SUBCUTANEOUS
  Filled 2016-09-10 (×2): qty 0.22

## 2016-09-10 MED ORDER — INSULIN DETEMIR 100 UNIT/ML ~~LOC~~ SOLN: 18.0000 [IU] | Freq: Two times a day (BID) | SUBCUTANEOUS | Status: DC

## 2016-09-10 MED ORDER — INSULIN ASPART 100 UNIT/ML ~~LOC~~ SOLN
0.0000 [IU] | SUBCUTANEOUS | Status: DC
  Administered 2016-09-10 (×2): 8 [IU] via SUBCUTANEOUS
  Administered 2016-09-11 (×3): 4 [IU] via SUBCUTANEOUS
  Administered 2016-09-11 (×2): 2 [IU] via SUBCUTANEOUS
  Administered 2016-09-11: 4 [IU] via SUBCUTANEOUS
  Administered 2016-09-12 – 2016-09-13 (×4): 2 [IU] via SUBCUTANEOUS
  Administered 2016-09-13 (×2): 4 [IU] via SUBCUTANEOUS
  Administered 2016-09-13: 2 [IU] via SUBCUTANEOUS
  Administered 2016-09-14: 4 [IU] via SUBCUTANEOUS
  Administered 2016-09-14 (×2): 12 [IU] via SUBCUTANEOUS
  Administered 2016-09-15: 4 [IU] via SUBCUTANEOUS
  Administered 2016-09-15 – 2016-09-17 (×4): 2 [IU] via SUBCUTANEOUS

## 2016-09-10 MED ORDER — FUROSEMIDE 10 MG/ML IJ SOLN
10.0000 mg/h | INTRAVENOUS | Status: DC
  Administered 2016-09-10: 8 mg/h via INTRAVENOUS
  Administered 2016-09-11 – 2016-09-12 (×3): 10 mg/h via INTRAVENOUS
  Filled 2016-09-10 (×5): qty 25

## 2016-09-10 MED ORDER — WARFARIN SODIUM 2.5 MG PO TABS
2.5000 mg | ORAL_TABLET | Freq: Every day | ORAL | Status: DC
  Administered 2016-09-10: 2.5 mg via ORAL
  Filled 2016-09-10: qty 1

## 2016-09-10 NOTE — Progress Notes (Signed)
OT Cancellation Note  Patient Details Name: Ronald Miller. MRN: [id number] DOB: [birth year]   Cancelled Treatment:    Reason Eval/Treat Not Completed: Patient not medically ready.  Orders to start POD #2.  Will try back.  [place], OTR/L [id number]   Lucille Passy M 09/10/2016, 10:28 AM

## 2016-09-10 NOTE — Progress Notes (Signed)
CT surgery p.m. Rounds  Status post redo sternotomy for HeartMate 3 VAD implantation yesterday Patient stood by side of bed today after extubation Nitric oxide remains at 3 ppm Norepinephrine and epinephrine being weaned off Receiving Lasix diuresis Pump parameters satisfactory P.m. labs reviewed

## 2016-09-10 NOTE — Progress Notes (Signed)
Anesthesiology Follow-up:  Awake and alert, neuro intact, extubated this AM at 11:15. Breathing unlabored. Hemodynamically stable on milrinone 0.3 mcg/kg/min Epi 2 mcg/min Levophed off  VS: T- 37.3 BP 83/68 (mean-68) RR- 18  HR 121 (afib) O2 sat 97% on 3L Lucas 3 ppm NO PA 29/22 CVP 11  Na- 138 K- 4.4 BUN/Cr. 14/1.2 glucose- 186 H/H 9.4/27.0 platelets- 82,000  LVAD parameters: Pump Speed: 5650 Pump Flow: 5.1 lpm Power: 4 watts PI: 2.9  No PI events today  Stable Post-op course so far on POD #1 following HM III placement for severe ischemic cardiomyopathy.   Ronald Miller

## 2016-09-10 NOTE — Progress Notes (Signed)
PT Cancellation Note  Patient Details Name: Ronald Miller. MRN: [id number] DOB: 07/07/1956   Cancelled Treatment:    Reason Eval/Treat Not Completed: Per orders, pt to start with PT POD #2. Will follow up tomorrow to complete PT eval.    Thelma Comp 09/10/2016, 7:10 AM   Rolinda Roan, PT, DPT Acute Rehabilitation Services Pager: [phone]

## 2016-09-10 NOTE — Procedures (Addendum)
Extubation Procedure Note  Patient Details:   Name: Ronald Miller. DOB: 12-06-1955 MRN: [id number]   Airway Documentation:     Evaluation  O2 sats: stable throughout Complications: No apparent complications Patient did tolerate procedure well. Bilateral Breath Sounds: Clear   Yes   Positive cuff leak, NIF -26, VC 910.  Pt placed on nasal cannula 3 Lpm with Nitric at 3, no stridor noted, able to get 500 using incentive spirometer.  Ronald Miller 09/10/2016, 11:44 AM

## 2016-09-10 NOTE — Care Management (Addendum)
Ronald Miller following up with [employer] regarding denial   CM made aware this am that pt was denied for LVAD placement.  CM texted paged both HF and LVAD team

## 2016-09-10 NOTE — Progress Notes (Signed)
HeartMate 3  Rounding Note Postop day #1 implantation of HeartMate 3 09-09-16 Subjective:    History of ischemic cardiomyopathy, EF 15% status post CABG 6 2001 History of COPD, substance abuse, AICD placement and moderate RV dysfunction Postoperative coagulopathy and acute blood loss anemia requiring transfusion Patient extubated postop day 1 with stable hemodynamics  LVAD INTERROGATION:  HeartMate II LVAD:  Flow 5.1 liters/min, speed 5600 RPM, power 4.2, PI 2.1.  Controller intact and connected  Objective:    Vital Signs:   Temp:  [97.5 F (36.4 C)-100.2 F (37.9 C)] 99.9 F (37.7 C) (12/08 1151) Pulse Rate:  [25-213] 108 (12/08 1115) Resp:  [0-24] 11 (12/08 1115) BP: (66-109)/(36-85) 89/62 (12/08 1115) SpO2:  [86 %-100 %] 96 % (12/08 1115) Arterial Line BP: (55-98)/(48-79) 85/72 (12/08 0645) FiO2 (%):  [40 %-50 %] 40 % (12/08 1013) Weight:  [243 lb 2.7 oz (110.3 kg)] 243 lb 2.7 oz (110.3 kg) (12/08 0500) Last BM Date: 09/08/16 Mean arterial Pressure 75 mmHg  Intake/Output:   Intake/Output Summary (Last 24 hours) at 09/10/16 1213 Last data filed at 09/10/16 0650  Gross per 24 hour  Intake          9784.57 ml  Output             5040 ml  Net          4744.57 ml     Physical Exam: General:  Well appearing. No resp difficulty HEENT: normal Neck: supple. No  JVP .Marland Kitchen No lymphadenopathy or thryomegaly appreciated. Cor: Mechanical heart sounds with LVAD hum present. Lungs: clear Abdomen: soft, nontender, nondistended. No hepatosplenomegaly. No bruits or masses. Good bowel sounds. Extremities: no cyanosis, clubbing, rash, edema Neuro: alert & orientedx3, cranial nerves grossly intact. moves all 4 extremities w/o difficulty. Affect pleasant  Telemetry: Sinus tachycardia  Labs: Basic Metabolic Panel:  Recent Labs Lab 09/07/16 0401 09/08/16 0603 09/09/16 0450  09/09/16 1618 09/09/16 2019 09/09/16 2126 09/09/16 2133 09/10/16 0234 09/10/16 0244 09/10/16 0959  NA  130* 133* 136  < > 137  --  142 141 141 138 141  K 3.5 4.0 3.3*  < > 3.4*  --  3.2* 3.2* 4.3 4.2 4.4  CL 93* 96* 101  < > 104  --  100* 102 106 107 104  CO2 '30 29 26  ' --  26  --   --   --   --  21*  --   GLUCOSE 259* 307* 237*  < > 137*  --  174* 176* 175* 171* 120*  BUN '19 17 15  ' < > 11  --  '12 11 12 11 12  ' CREATININE 1.05 1.05 0.89  < > 0.94 1.04 0.90 0.90 0.90 1.08 0.90  CALCIUM 8.8* 9.1 8.0*  --  8.3*  --   --   --   --  8.4*  --   MG  --  2.0  --   --  1.7 2.5*  --   --   --  2.2  --   PHOS  --   --   --   --   --   --   --   --   --  3.0  --   < > = values in this interval not displayed.  Liver Function Tests:  Recent Labs Lab 09/04/16 0500 09/10/16 0244  AST 27 114*  ALT 24 23  ALKPHOS 78 44  BILITOT 0.6 2.2*  PROT 6.9 5.4*  ALBUMIN 3.5 3.8  No results for input(s): LIPASE, AMYLASE in the last 168 hours. No results for input(s): AMMONIA in the last 168 hours.  CBC:  Recent Labs Lab 09/08/16 0603 09/09/16 0450  09/09/16 1222  09/09/16 1618  09/09/16 2130 09/09/16 2133 09/10/16 0234 09/10/16 0244 09/10/16 0955 09/10/16 0959  WBC 5.5 5.8  --   --   --   --   --  12.4*  --   --  16.9* 14.7*  --   NEUTROABS  --   --   --   --   --   --   --   --   --   --  15.1*  --   --   HGB 12.5* 12.5*  < > 8.2*  < >  --   < > 7.6* 6.5* 9.2* 9.5* 9.4* 8.8*  HCT 37.0* 36.3*  < > 22.7*  < >  --   < > 21.5* 19.0* 27.0* 27.9* 27.3* 26.0*  MCV 91.1 89.6  --   --   --   --   --  88.8  --   --  89.1 87.8  --   PLT 160 160  --  98*  --  95*  --  87*  --   --  109* 95*  --   < > = values in this interval not displayed.  INR:  Recent Labs Lab 09/08/16 0603 09/09/16 1618 09/10/16 0244  INR 1.02 1.57 1.06    Other results:  EKG:   Imaging: Dg Chest Port 1 View  Result Date: 09/10/2016 CLINICAL DATA:  Postop LVAD.  Chest tube, intubated EXAM: PORTABLE CHEST 1 VIEW COMPARISON:  09/09/2016 FINDINGS: Support devices remain in place, unchanged. No pneumothorax. LVAD and left  chest tubes project over the left heart. Mild cardiomegaly. Left lower lobe opacity likely reflects atelectasis. Possible small left effusion. IMPRESSION: Support devices stable. No pneumothorax. Left lower lobe atelectasis and possible small left effusion. Electronically Signed   By: Rolm Baptise M.D.   On: 09/10/2016 08:07   Dg Chest Port 1 View  Result Date: 09/09/2016 CLINICAL DATA:  LVAD, chest tube EXAM: PORTABLE CHEST 1 VIEW COMPARISON:  09/08/2016 FINDINGS: Endotracheal tube has been placed with the tip 5.5 cm above the carina. Swan-Ganz catheter tip is in the main pulmonary artery. Left chest tube is in place. No pneumothorax. Left AICD remains in place, unchanged. LVAD is in place. Cardiomegaly with vascular congestion. IMPRESSION: LVAD placement. Left chest tube without pneumothorax. Endotracheal tube in expected position. Cardiomegaly, vascular congestion. Electronically Signed   By: Rolm Baptise M.D.   On: 09/09/2016 17:02      Medications:     Scheduled Medications: . sodium chloride   Intravenous Once  . sodium chloride   Intravenous Once  . acetaminophen  1,000 mg Oral Q6H   Or  . acetaminophen (TYLENOL) oral liquid 160 mg/5 mL  1,000 mg Per Tube Q6H  . aspirin EC  81 mg Oral Daily  . atorvastatin  20 mg Oral QHS  . bisacodyl  10 mg Oral Daily   Or  . bisacodyl  10 mg Rectal Daily  . cefUROXime (ZINACEF)  IV  1.5 g Intravenous Q12H  . chlorhexidine  15 mL Mouth/Throat BID  . chlorhexidine gluconate (MEDLINE KIT)  15 mL Mouth Rinse BID  . digoxin  250 mcg Oral Daily  . docusate sodium  200 mg Oral Daily  . escitalopram  20 mg Oral Daily  . insulin aspart  0-24  Units Subcutaneous Q4H  . insulin detemir  22 Units Subcutaneous BID  . mouth rinse  15 mL Mouth Rinse QID  . metoCLOPramide (REGLAN) injection  10 mg Intravenous Q6H  . multivitamin with minerals  1 tablet Oral Daily  . [START ON 09/11/2016] pantoprazole  40 mg Oral Daily  . potassium chloride  10 mEq  Intravenous Once  . sodium chloride flush  3 mL Intravenous Q12H  . vancomycin  1,250 mg Intravenous Q12H  . warfarin  2.5 mg Oral q1800  . Warfarin - Physician Dosing Inpatient   Does not apply q1800     Infusions: . sodium chloride 20 mL/hr (09/09/16 1600)  . sodium chloride    . sodium chloride 20 mL/hr (09/09/16 1600)  . EPINEPHrine 4 mg in dextrose 5% 250 mL infusion (16 mcg/mL) 4 mcg/min (09/10/16 0627)  . furosemide (LASIX) infusion 8 mg/hr (09/10/16 0901)  . insulin (NOVOLIN-R) infusion 15 mL/hr at 09/10/16 0500  . lactated ringers 10 mL/hr (09/09/16 1600)  . lactated ringers 20 mL/hr (09/09/16 1600)  . milrinone 0.3 mcg/kg/min (09/10/16 0600)  . nitroGLYCERIN Stopped (09/09/16 1630)  . 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  No programming changes were made.  The LVAD is functioning within specified parameters.  The patient performs LVAD self-test daily.  LVAD interrogation  was negative for any significant power changes, alarms or PI events/speed drops.  LVAD equipment check completed and is in good working order.  Back-up equipment present.   LVAD education done on emergency procedures and precautions and reviewed exit site care.  Length of Stay: Clayton III 09/10/2016, 12:13 PM

## 2016-09-10 NOTE — Care Management Important Message (Signed)
Important Message  Patient Details  Name: Ronald Miller. MRN: [id number] Date of Birth: 02-Nov-1955   Medicare Important Message Given:  Yes    [name] 09/10/2016, 12:12 PM

## 2016-09-10 NOTE — Progress Notes (Signed)
HeartMate 2 Rounding Note  Subjective:    S/P HMII Post Op Day 1  Remains intubated and sedated.. Currently on norepi at 4 mcg, milrionone 0.3 mcg, epi 4, and NO at 4PPM.   Cardiac Output 5.3  CO-OX 75%   LVAD INTERROGATION:  HeartMate III LVAD:  Flow 5.3 liters/min, speed 5600, power 4.1, PI 1.8.   Objective:    Vital Signs:   Temp:  [97.5 F (36.4 C)-100.2 F (37.9 C)] 100.2 F (37.9 C) (12/08 0645) Pulse Rate:  [25-213] 107 (12/08 0645) Resp:  [0-24] 17 (12/08 0645) BP: (66-109)/(36-85) 98/74 (12/08 0630) SpO2:  [86 %-100 %] 97 % (12/08 0645) Arterial Line BP: (55-98)/(48-79) 85/72 (12/08 0645) FiO2 (%):  [50 %] 50 % (12/08 0600) Weight:  [243 lb 2.7 oz (110.3 kg)] 243 lb 2.7 oz (110.3 kg) (12/08 0500) Last BM Date: 09/08/16 Mean arterial Pressure 75  Intake/Output:   Intake/Output Summary (Last 24 hours) at 09/10/16 0859 Last data filed at 09/10/16 0650  Gross per 24 hour  Intake          9784.57 ml  Output             5790 ml  Net          3994.57 ml     Physical Exam: CVP 14-16 General:  Sedated on the vent HEENT: normal Neck: supple. JVP to jaw . Carotids 2+ bilat; no bruits. No lymphadenopathy or thryomegaly appreciated. RIJ swan  Cor: Mechanical heart sounds with LVAD hum present. Lungs: clear Abdomen: soft, nontender, nondistended. No hepatosplenomegaly. No bruits or masses. Good bowel sounds. Driveline: C/D/I; securement device intact and driveline incorporated Extremities: no cyanosis, clubbing, rash, edema Neuro: sedated on the vent GU: Foley  Telemetry: V paced 100s   Labs: Basic Metabolic Panel:  Recent Labs Lab 09/07/16 0401 09/08/16 0603 09/09/16 0450  09/09/16 1357  09/09/16 1609 09/09/16 1618 09/09/16 2019 09/09/16 2126 09/09/16 2133 09/10/16 0244  NA 130* 133* 136  < > 137  < > 141 137  --  142 141 138  K 3.5 4.0 3.3*  < > 3.3*  < > 3.4* 3.4*  --  3.2* 3.2* 4.2  CL 93* 96* 101  < > 98*  --   --  104  --  100* 102 107  CO2  _0 --   --   --   --  26  --   --   --  21*  GLUCOSE 259* 307* 237*  < > 144*  --  133* 137*  --  174* 176* 171*  BUN _1 < > 14  --   --  11  --  _2 CREATININE 1.05 1.05 0.89  < > 0.80  --   --  0.94 1.04 0.90 0.90 1.08  CALCIUM 8.8* 9.1 8.0*  --   --   --   --  8.3*  --   --   --  8.4*  MG  --  2.0  --   --   --   --   --  1.7 2.5*  --   --  2.2  PHOS  --   --   --   --   --   --   --   --   --   --   --  3.0  < > = values in this interval not displayed.  Liver Function Tests:  Recent Labs Lab 09/04/16  0500 09/10/16 0244  AST 27 114*  ALT 24 23  ALKPHOS 78 44  BILITOT 0.6 2.2*  PROT 6.9 5.4*  ALBUMIN 3.5 3.8   No results for input(s): LIPASE, AMYLASE in the last 168 hours. No results for input(s): AMMONIA in the last 168 hours.  CBC:  Recent Labs Lab 09/07/16 0401 09/08/16 0603 09/09/16 0450  09/09/16 1222  09/09/16 1609 09/09/16 1618 09/09/16 2126 09/09/16 2130 09/09/16 2133 09/10/16 0244  WBC 6.0 5.5 5.8  --   --   --   --   --   --  12.4*  --  16.9*  NEUTROABS  --   --   --   --   --   --   --   --   --   --   --  15.1*  HGB 12.4* 12.5* 12.5*  < > 8.2*  < > 8.8*  --  6.5* 7.6* 6.5* 9.5*  HCT 36.0* 37.0* 36.3*  < > 22.7*  < > 26.0*  --  19.0* 21.5* 19.0* 27.9*  MCV 89.8 91.1 89.6  --   --   --   --   --   --  88.8  --  89.1  PLT 160 160 160  --  98*  --   --  95*  --  87*  --  109*  < > = values in this interval not displayed.  INR:  Recent Labs Lab 09/08/16 0603 09/09/16 1618 09/10/16 0244  INR 1.02 1.57 1.06    Other results:  EKG:   Imaging: Dg Chest Port 1 View  Result Date: 09/10/2016 CLINICAL DATA:  Postop LVAD.  Chest tube, intubated EXAM: PORTABLE CHEST 1 VIEW COMPARISON:  09/09/2016 FINDINGS: Support devices remain in place, unchanged. No pneumothorax. LVAD and left chest tubes project over the left heart. Mild cardiomegaly. Left lower lobe opacity likely reflects atelectasis. Possible small left effusion. IMPRESSION:  Support devices stable. No pneumothorax. Left lower lobe atelectasis and possible small left effusion. Electronically Signed   By: Rolm Baptise M.D.   On: 09/10/2016 08:07   Dg Chest Port 1 View  Result Date: 09/09/2016 CLINICAL DATA:  LVAD, chest tube EXAM: PORTABLE CHEST 1 VIEW COMPARISON:  09/08/2016 FINDINGS: Endotracheal tube has been placed with the tip 5.5 cm above the carina. Swan-Ganz catheter tip is in the main pulmonary artery. Left chest tube is in place. No pneumothorax. Left AICD remains in place, unchanged. LVAD is in place. Cardiomegaly with vascular congestion. IMPRESSION: LVAD placement. Left chest tube without pneumothorax. Endotracheal tube in expected position. Cardiomegaly, vascular congestion. Electronically Signed   By: Rolm Baptise M.D.   On: 09/09/2016 17:02      Medications:     Scheduled Medications: . sodium chloride   Intravenous Once  . sodium chloride   Intravenous Once  . acetaminophen  1,000 mg Oral Q6H   Or  . acetaminophen (TYLENOL) oral liquid 160 mg/5 mL  1,000 mg Per Tube Q6H  . aspirin EC  81 mg Oral Daily  . atorvastatin  20 mg Oral QHS  . bisacodyl  10 mg Oral Daily   Or  . bisacodyl  10 mg Rectal Daily  . cefUROXime (ZINACEF)  IV  1.5 g Intravenous Q12H  . chlorhexidine  15 mL Mouth/Throat BID  . chlorhexidine gluconate (MEDLINE KIT)  15 mL Mouth Rinse BID  . digoxin  250 mcg Oral Daily  . docusate sodium  200 mg Oral Daily  . escitalopram  20 mg Oral Daily  . famotidine (PEPCID) IV  20 mg Intravenous Q12H  . insulin aspart  0-24 Units Subcutaneous Q4H  . insulin detemir  22 Units Subcutaneous BID  . mouth rinse  15 mL Mouth Rinse QID  . metoCLOPramide (REGLAN) injection  10 mg Intravenous Q6H  . multivitamin with minerals  1 tablet Oral Daily  . [START ON 09/11/2016] pantoprazole  40 mg Oral Daily  . potassium chloride  10 mEq Intravenous Once  . sodium chloride flush  3 mL Intravenous Q12H  . vancomycin  1,250 mg Intravenous Q12H  .  warfarin  2.5 mg Oral q1800  . Warfarin - Physician Dosing Inpatient   Does not apply q1800     Infusions: . sodium chloride 20 mL/hr (09/09/16 1600)  . sodium chloride    . sodium chloride 20 mL/hr (09/09/16 1600)  . EPINEPHrine 4 mg in dextrose 5% 250 mL infusion (16 mcg/mL) 4 mcg/min (09/10/16 0627)  . furosemide (LASIX) infusion    . insulin (NOVOLIN-R) infusion 15 mL/hr at 09/10/16 0500  . lactated ringers 10 mL/hr (09/09/16 1600)  . lactated ringers 20 mL/hr (09/09/16 1600)  . milrinone 0.3 mcg/kg/min (09/10/16 0600)  . nitroGLYCERIN Stopped (09/09/16 1630)  . norepinephrine (LEVOPHED) Adult infusion 6 mcg/min (09/10/16 0630)     PRN Medications:  sodium chloride, albumin human, albuterol, fentaNYL (SUBLIMAZE) injection, Melatonin, midazolam, ondansetron (ZOFRAN) IV, senna, sodium chloride flush, sodium chloride flush   Assessment/Plan/Discussion    1. 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  Echo 08/24/16 LVEF 15%, RV mild dilated, moderately reduced.  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  CVP 14-16.  Diuresing with IV lasix.  2. AKI on CKD III: - Renal function stable. Follow closely.  3. CAD: No CP. 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  4. DMII: Hgb A1C 8.3 on 08/26/2016  - Cover with SSI  5. RLE cellulitis: Completed 8 days of Vancomycin.  6. Tobacco abuse:  7. Hypokalemia:  - Stable. 8. 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 Normal spleen. No ascites.     I reviewed the LVAD parameters from today, and compared the results to the patient's prior recorded data.  No programming changes were made.  The LVAD is functioning within specified parameters.  The patient performs LVAD self-test daily.  LVAD interrogation was negative for any significant power changes, alarms or PI events/speed drops.  LVAD  equipment check completed and is in good working order.  Back-up equipment present.   LVAD education done on emergency procedures and precautions and reviewed exit site care.  Length of Stay: [place] NP-C  09/10/2016, 8:59 AM  VAD Team --- VAD ISSUES ONLY--- Pager [phone] (7am - 7am)  Advanced Heart Failure Team  Pager [phone] (M-F; 7a - 4p)  Please contact [facility] for night-coverage after hours (4p -7a ) and weekends on [email or web address]  Patient seen and examined with Darrick Grinder, NP. We discussed all aspects of the encounter. I agree with the assessment and plan as stated above.   He is POD #1 s/p HM-3 VAD placement. Just extubated this am with Dr. Prescott Gum and myself at bedside. VAD parameters are stable. He is volume overloaded. We will continue lasix gtt. Wean pressors as tolerated. (wean epi first then norepi. Keep milrinone). Renal function  and electrolytes stable. Will keep swan in one more day to follow hemodynamics. Hgb 9.5.   The patient is critically ill with multiple organ systems failure and requires high complexity decision making for assessment and support, frequent evaluation and titration of therapies, application of advanced monitoring technologies and extensive interpretation of multiple databases.   Critical Care Time devoted to patient care services described in this note is 35 Minutes.  Blimi Godby,MD 6:09 PM

## 2016-09-11 ENCOUNTER — Inpatient Hospital Stay (HOSPITAL_COMMUNITY): Payer: Medicare HMO

## 2016-09-11 DIAGNOSIS — Z95811 Presence of heart assist device: Secondary | ICD-10-CM

## 2016-09-11 LAB — COOXEMETRY PANEL
Carboxyhemoglobin: 1.1 % (ref 0.5–1.5)
Methemoglobin: 1.1 % (ref 0.0–1.5)
O2 Saturation: 55.5 %
Total hemoglobin: 9.1 g/dL — ABNORMAL LOW (ref 12.0–16.0)

## 2016-09-11 LAB — PROTIME-INR
INR: 1.32
Prothrombin Time: 16.5 seconds — ABNORMAL HIGH (ref 11.4–15.2)

## 2016-09-11 LAB — CBC WITH DIFFERENTIAL/PLATELET
Basophils Absolute: 0 10*3/uL (ref 0.0–0.1)
Basophils Relative: 0 %
Eosinophils Absolute: 0 10*3/uL (ref 0.0–0.7)
Eosinophils Relative: 0 %
HCT: 26.6 % — ABNORMAL LOW (ref 39.0–52.0)
Hemoglobin: 9 g/dL — ABNORMAL LOW (ref 13.0–17.0)
Lymphocytes Relative: 8 %
Lymphs Abs: 1.1 10*3/uL (ref 0.7–4.0)
MCH: 31.1 pg (ref 26.0–34.0)
MCHC: 33.8 g/dL (ref 30.0–36.0)
MCV: 92 fL (ref 78.0–100.0)
Monocytes Absolute: 1 10*3/uL (ref 0.1–1.0)
Monocytes Relative: 7 %
Neutro Abs: 11.6 10*3/uL — ABNORMAL HIGH (ref 1.7–7.7)
Neutrophils Relative %: 84 %
Platelets: 70 10*3/uL — ABNORMAL LOW (ref 150–400)
RBC: 2.89 MIL/uL — ABNORMAL LOW (ref 4.22–5.81)
RDW: 14.6 % (ref 11.5–15.5)
WBC: 13.7 10*3/uL — ABNORMAL HIGH (ref 4.0–10.5)

## 2016-09-11 LAB — GLUCOSE, CAPILLARY
GLUCOSE-CAPILLARY: 162 mg/dL — AB (ref 65–99)
GLUCOSE-CAPILLARY: 147 mg/dL — AB (ref 65–99)
GLUCOSE-CAPILLARY: 162 mg/dL — AB (ref 65–99)
Glucose-Capillary: 163 mg/dL — ABNORMAL HIGH (ref 65–99)
Glucose-Capillary: 147 mg/dL — ABNORMAL HIGH (ref 65–99)

## 2016-09-11 LAB — POCT I-STAT 3, ART BLOOD GAS (G3+)
Acid-base deficit: 3 mmol/L — ABNORMAL HIGH (ref 0.0–2.0)
Bicarbonate: 24.1 mmol/L (ref 20.0–28.0)
Bicarbonate: 24.9 mmol/L (ref 20.0–28.0)
O2 SAT: 93 %
O2 SAT: 98 %
PCO2 ART: 52.5 mmHg — AB (ref 32.0–48.0)
PCO2 ART: 40.9 mmHg (ref 32.0–48.0)
PH ART: 7.27 — AB (ref 7.350–7.450)
PO2 ART: 97 mmHg (ref 83.0–108.0)
Patient temperature: 36.9
TCO2: 26 mmol/L (ref 0–100)
TCO2: 26 mmol/L (ref 0–100)
pH, Arterial: 7.39 (ref 7.350–7.450)
pO2, Arterial: 78 mmHg — ABNORMAL LOW (ref 83.0–108.0)

## 2016-09-11 LAB — POCT I-STAT, CHEM 8
BUN: 20 mg/dL (ref 6–20)
BUN: 20 mg/dL (ref 6–20)
CALCIUM ION: 1.15 mmol/L (ref 1.15–1.40)
CALCIUM ION: 1.16 mmol/L (ref 1.15–1.40)
CREATININE: 1.3 mg/dL — AB (ref 0.61–1.24)
Chloride: 102 mmol/L (ref 101–111)
Chloride: 99 mmol/L — ABNORMAL LOW (ref 101–111)
Creatinine, Ser: 1.3 mg/dL — ABNORMAL HIGH (ref 0.61–1.24)
Glucose, Bld: 150 mg/dL — ABNORMAL HIGH (ref 65–99)
Glucose, Bld: 164 mg/dL — ABNORMAL HIGH (ref 65–99)
HCT: 25 % — ABNORMAL LOW (ref 39.0–52.0)
HEMATOCRIT: 25 % — AB (ref 39.0–52.0)
HEMOGLOBIN: 8.5 g/dL — AB (ref 13.0–17.0)
HEMOGLOBIN: 8.5 g/dL — AB (ref 13.0–17.0)
Potassium: 4.9 mmol/L (ref 3.5–5.1)
Potassium: 4.5 mmol/L (ref 3.5–5.1)
SODIUM: 137 mmol/L (ref 135–145)
SODIUM: 136 mmol/L (ref 135–145)
TCO2: 25 mmol/L (ref 0–100)
TCO2: 24 mmol/L (ref 0–100)

## 2016-09-11 LAB — COMPREHENSIVE METABOLIC PANEL
ALT: 55 U/L (ref 17–63)
AST: 151 U/L — ABNORMAL HIGH (ref 15–41)
Albumin: 3.5 g/dL (ref 3.5–5.0)
Alkaline Phosphatase: 52 U/L (ref 38–126)
Anion gap: 10 (ref 5–15)
BUN: 16 mg/dL (ref 6–20)
CO2: 23 mmol/L (ref 22–32)
Calcium: 8.2 mg/dL — ABNORMAL LOW (ref 8.9–10.3)
Chloride: 103 mmol/L (ref 101–111)
Creatinine, Ser: 1.31 mg/dL — ABNORMAL HIGH (ref 0.61–1.24)
GFR calc Af Amer: >60 mL/min (ref >60)
GFR calc non Af Amer: 58 mL/min — ABNORMAL LOW (ref >60)
Glucose, Bld: 179 mg/dL — ABNORMAL HIGH (ref 65–99)
Potassium: 5 mmol/L (ref 3.5–5.1)
Sodium: 136 mmol/L (ref 135–145)
Total Bilirubin: 1.9 mg/dL — ABNORMAL HIGH (ref 0.3–1.2)
Total Protein: 6 g/dL — ABNORMAL LOW (ref 6.5–8.1)

## 2016-09-11 LAB — LACTATE DEHYDROGENASE: LDH: 485 U/L — ABNORMAL HIGH (ref 98–192)

## 2016-09-11 LAB — MAGNESIUM: Magnesium: 2 mg/dL (ref 1.7–2.4)

## 2016-09-11 LAB — PHOSPHORUS: Phosphorus: 3.8 mg/dL (ref 2.5–4.6)

## 2016-09-11 MED ORDER — WARFARIN SODIUM 3 MG PO TABS
4.0000 mg | ORAL_TABLET | Freq: Every day | ORAL | Status: DC
  Filled 2016-09-11: qty 0.5

## 2016-09-11 MED ORDER — WARFARIN SODIUM 4 MG PO TABS
4.0000 mg | ORAL_TABLET | Freq: Once | ORAL | Status: AC
  Administered 2016-09-11: 4 mg via ORAL
  Filled 2016-09-11: qty 1

## 2016-09-11 MED ORDER — FUROSEMIDE 10 MG/ML IJ SOLN
40.0000 mg | Freq: Once | INTRAMUSCULAR | Status: AC
  Administered 2016-09-11: 40 mg via INTRAVENOUS
  Filled 2016-09-11: qty 4

## 2016-09-11 MED ORDER — OXYCODONE HCL 5 MG PO TABS
5.0000 mg | ORAL_TABLET | ORAL | Status: DC | PRN
  Administered 2016-09-11: 5 mg via ORAL
  Filled 2016-09-11: qty 1

## 2016-09-11 MED ORDER — ASPIRIN EC 325 MG PO TBEC
325.0000 mg | DELAYED_RELEASE_TABLET | Freq: Every day | ORAL | Status: DC
  Administered 2016-09-11: 325 mg via ORAL

## 2016-09-11 MED ORDER — DEXTROSE 5 % IV SOLN
1.5000 g | Freq: Two times a day (BID) | INTRAVENOUS | Status: DC
  Administered 2016-09-11 – 2016-09-12 (×3): 1.5 g via INTRAVENOUS
  Filled 2016-09-11 (×4): qty 1.5

## 2016-09-11 MED ORDER — FENTANYL CITRATE (PF) 100 MCG/2ML IJ SOLN: 50.0000 ug | INTRAMUSCULAR | Status: DC | PRN

## 2016-09-11 MED ORDER — OXYCODONE HCL 5 MG PO TABS
5.0000 mg | ORAL_TABLET | ORAL | Status: DC | PRN
  Administered 2016-09-11: 10 mg via ORAL
  Administered 2016-09-11: 5 mg via ORAL
  Administered 2016-09-11 – 2016-09-25 (×70): 10 mg via ORAL
  Filled 2016-09-11 (×32): qty 2
  Filled 2016-09-11: qty 1
  Filled 2016-09-11 (×39): qty 2

## 2016-09-11 MED ORDER — METOLAZONE 5 MG PO TABS
5.0000 mg | ORAL_TABLET | Freq: Every day | ORAL | Status: DC
  Administered 2016-09-11: 5 mg via ORAL
  Filled 2016-09-11: qty 1

## 2016-09-11 NOTE — Evaluation (Signed)
Physical Therapy Evaluation Patient Details Name: Ronald Miller. MRN: [id number] DOB: 06-26-1956 Today's Date: 09/11/2016   History of Present Illness  Patient is a 60 yo male admitted 08/22/16 with acute on chronic systolic HF, with LVEF at 15%.   PMH:  chronic HF, ICM, CAD, CABG, ICD, HTN, polysubstance abuse, depression, COPD, CKD, DM  Clinical Impression  Patient presents with problems listed below.  Will benefit from acute PT to maximize functional independence prior to discharge to sister's home.  At this point, recommend HHPT for continued therapy at d/c.  If patient progresses well with mobility, may not need HHPT - will continue to assess.    Follow Up Recommendations Home health PT;Supervision for mobility/OOB    Equipment Recommendations  Other (comment) (TBD)    Recommendations for Other Services       Precautions / Restrictions Precautions Precautions: Sternal Restrictions Weight Bearing Restrictions: No Other Position/Activity Restrictions: Patient with medial mediastinal lines in place.      Mobility  Bed Mobility Overal bed mobility: Needs Assistance Bed Mobility: Sit to Sidelying;Rolling Rolling: Min assist       Sit to sidelying: Mod assist;+2 for physical assistance;+2 for safety/equipment General bed mobility comments: Verbal cues for correct technique to maintain sternal precautions.  Assist to move to sidelying - to control trunk, to bring LE's onto bed, and to manage lines. (RN assisted with lines)  Transfers Overall transfer level: Needs assistance Equipment used: 2 person hand held assist Transfers: Sit to/from [facility] Sit to Stand: Min assist;+2 physical assistance;+2 safety/equipment Stand pivot transfers: Min assist;+2 physical assistance;+2 safety/equipment       General transfer comment: Verbal cues for use of pillow and technique.  Assist to rise to standing.  Fair balance in stance.  Patient able to take several  side-steps to move to bed with +2 assist and +1 for lines.  Ambulation/Gait             General Gait Details: NT due to mediastinal line  Stairs            Wheelchair Mobility    Modified Rankin (Stroke Patients Only)       Balance Overall balance assessment: Needs assistance         Standing balance support: Single extremity supported Standing balance-Leahy Scale: Fair                               Pertinent Vitals/Pain Pain Assessment: 0-10 Pain Score: 6  Pain Location: chest/sternal area Pain Descriptors / Indicators: Discomfort;Sore Pain Intervention(s): Monitored during session;Repositioned    Home Living Family/patient expects to be discharged to:: Private residence (Will be going to sister's home) Living Arrangements: Alone Available Help at Discharge: Family;Available 24 hours/day (Sister and mother) Type of Home: House Home Access: Stairs to enter Entrance Stairs-Rails: Right Entrance Stairs-Number of Steps: 2 Home Layout: One level Home Equipment: None      Prior Function Level of Independence: Independent         Comments: Patient reports he does drive     Hand Dominance        Extremity/Trunk Assessment   Upper Extremity Assessment: Overall WFL for tasks assessed (Minimal strength/ROM testing due to sternal precautions)           Lower Extremity Assessment: Generalized weakness         Communication   Communication: No difficulties  Cognition Arousal/Alertness: Awake/alert Behavior During Therapy:  WFL for tasks assessed/performed;Anxious Overall Cognitive Status: Within Functional Limits for tasks assessed                      General Comments General comments (skin integrity, edema, etc.): Patient remained connected to wall power source during session.  Did not review power source change today.    Exercises     Assessment/Plan    PT Assessment Patient needs continued PT services  PT  Problem List Decreased strength;Decreased activity tolerance;Decreased balance;Decreased mobility;Decreased knowledge of use of DME;Decreased knowledge of precautions;Cardiopulmonary status limiting activity;Obesity;Pain          PT Treatment Interventions DME instruction;Gait training;Functional mobility training;Therapeutic activities;Patient/family education    PT Goals (Current goals can be found in the Care Plan section)  Acute Rehab PT Goals Patient Stated Goal: To decrease pain. PT Goal Formulation: With patient Time For Goal Achievement: 09/25/16 Potential to Achieve Goals: Good    Frequency Min 4X/week   Barriers to discharge Decreased caregiver support Patient lives alone.  Will be going to sister's home at d/c per patient.    Co-evaluation               End of Session Equipment Utilized During Treatment: Oxygen Activity Tolerance: Patient limited by fatigue;Patient limited by pain Patient left: in bed;with call bell/phone within reach;with nursing/sitter in room Nurse Communication: Mobility status         Time: [id number] PT Time Calculation (min) (ACUTE ONLY): 14 min   Charges:   PT Evaluation $PT Eval High Complexity: 1 Procedure     PT G Codes:        Despina Pole 09/26/16, 7:26 PM Carita Pian. Sanjuana Kava, [place] Pager [phone]

## 2016-09-11 NOTE — Progress Notes (Signed)
HeartMate 2 Rounding Note  Subjective:    S/P HMII Post Op Day 2  Extubated yesterday. Chest sore but feeling better. Was sleepy this am due to sedation. O2 sats low. Remains markedly volume overloaded (~30 pounds). Diuretics increased.   Off norepi and epi. Remains on milrinone. MAPs 70s   CO-OX 56%  LVAD INTERROGATION:  HeartMate III LVAD:  Flow 5.2 liters/min, speed 5600, power 4.0, PI 2.3.  Occasional PI  Objective:    Vital Signs:   Temp:  [98.1 F (36.7 C)-100 F (37.8 C)] 98.2 F (36.8 C) (12/09 0730) Pulse Rate:  [28-131] 107 (12/09 0808) Resp:  [0-28] 13 (12/09 0808) BP: (71-119)/(48-107) 91/73 (12/09 0808) SpO2:  [85 %-100 %] 96 % (12/09 0808) Arterial Line BP: (71-89)/(62-78) 73/63 (12/09 0730) Weight:  [111.3 kg (245 lb 6 oz)] 111.3 kg (245 lb 6 oz) (12/09 0500) Last BM Date: 09/08/16 Mean arterial Pressure 70s  Intake/Output:   Intake/Output Summary (Last 24 hours) at 09/11/16 1053 Last data filed at 09/11/16 0700  Gross per 24 hour  Intake          2553.33 ml  Output             2890 ml  Net          -336.67 ml     Physical Exam: CVP 15 General:  Awake Lying in bed. NAD HEENT: normal Neck: supple. JVP to jaw . +IJ cordis Carotids 2+ bilat; no bruits. No lymphadenopathy or thryomegaly appreciated. RIJ swan  Cor: Mechanical heart sounds with LVAD hum present. + CTs Lungs: clear Abdomen: soft, nontender, + distended. Minimal BS Driveline: C/D/I; securement device intact and driveline incorporated Extremities: no cyanosis, clubbing, rash, edema Neuro: awake and oriented. nonfocal GU: Foley  Telemetry: V paced 100s   Labs: Basic Metabolic Panel:  Recent Labs Lab 09/08/16 0603 09/09/16 0450  09/09/16 1618 09/09/16 2019  09/10/16 0244 09/10/16 0959 09/10/16 1629 09/10/16 1751 09/11/16 0430 09/11/16 1013  NA 133* 136  < > 137  --   < > 138 141 138  --  136 137  K 4.0 3.3*  < > 3.4*  --   < > 4.2 4.4 4.4  --  5.0 4.9  CL 96* 101  < > 104   --   < > 107 104 104  --  103 102  CO2 29 26  --  26  --   --  21*  --   --   --  23  --   GLUCOSE 307* 237*  < > 137*  --   < > 171* 120* 216*  --  179* 150*  BUN 17 15  < > 11  --   < > 11 12 14   --  16 20  CREATININE 1.05 0.89  < > 0.94 1.04  < > 1.08 0.90 1.10 1.20 1.31* 1.30*  CALCIUM 9.1 8.0*  --  8.3*  --   --  8.4*  --   --   --  8.2*  --   MG 2.0  --   --  1.7 2.5*  --  2.2  --   --  2.0 2.0  --   PHOS  --   --   --   --   --   --  3.0  --   --   --  3.8  --   < > = values in this interval not displayed.  Liver Function Tests:  Recent Labs  Lab 09/10/16 0244 09/11/16 0430  AST 114* 151*  ALT 23 55  ALKPHOS 44 52  BILITOT 2.2* 1.9*  PROT 5.4* 6.0*  ALBUMIN 3.8 3.5   No results for input(s): LIPASE, AMYLASE in the last 168 hours. No results for input(s): AMMONIA in the last 168 hours.  CBC:  Recent Labs Lab 09/09/16 2130  09/10/16 0244 09/10/16 0955 09/10/16 0959 09/10/16 1629 09/10/16 1751 09/11/16 0430 09/11/16 1013  WBC 12.4*  --  16.9* 14.7*  --   --  15.6* 13.7*  --   NEUTROABS  --   --  15.1*  --   --   --   --  11.6*  --   HGB 7.6*  < > 9.5* 9.4* 8.8* 8.5* 9.4* 9.0* 8.5*  HCT 21.5*  < > 27.9* 27.3* 26.0* 25.0* 27.0* 26.6* 25.0*  MCV 88.8  --  89.1 87.8  --   --  89.4 92.0  --   PLT 87*  --  109* 95*  --   --  82* 70*  --   < > = values in this interval not displayed.  INR:  Recent Labs Lab 09/08/16 0603 09/09/16 1618 09/10/16 0244 09/11/16 0430  INR 1.02 1.57 1.06 1.32    Other results:    Imaging: Dg Chest Port 1 View  Result Date: 09/11/2016 CLINICAL DATA:  LVAD. EXAM: PORTABLE CHEST 1 VIEW COMPARISON:  09/10/2016 FINDINGS: LVAD unchanged. Sternotomy wires and left-sided pacemaker unchanged. Right IJ Swan-Ganz catheter with tip over the main pulmonary artery segment unchanged. Right IJ central venous catheter with tip over the SVC unchanged. Two left-sided chest tubes present. Mediastinal drain unchanged. The Lungs are adequately inflated  without definite focal consolidation, effusion or pneumothorax. Mild stable cardiomegaly. Remainder of the exam is unchanged. IMPRESSION: No acute pulmonary disease.  Mild stable cardiomegaly. Tubes and lines as described.  LVAD present. Electronically Signed   By: Marin Olp M.D.   On: 09/11/2016 08:14   Dg Chest Port 1 View  Result Date: 09/10/2016 CLINICAL DATA:  Postop LVAD.  Chest tube, intubated EXAM: PORTABLE CHEST 1 VIEW COMPARISON:  09/09/2016 FINDINGS: Support devices remain in place, unchanged. No pneumothorax. LVAD and left chest tubes project over the left heart. Mild cardiomegaly. Left lower lobe opacity likely reflects atelectasis. Possible small left effusion. IMPRESSION: Support devices stable. No pneumothorax. Left lower lobe atelectasis and possible small left effusion. Electronically Signed   By: Rolm Baptise M.D.   On: 09/10/2016 08:07   Dg Chest Port 1 View  Result Date: 09/09/2016 CLINICAL DATA:  LVAD, chest tube EXAM: PORTABLE CHEST 1 VIEW COMPARISON:  09/08/2016 FINDINGS: Endotracheal tube has been placed with the tip 5.5 cm above the carina. Swan-Ganz catheter tip is in the main pulmonary artery. Left chest tube is in place. No pneumothorax. Left AICD remains in place, unchanged. LVAD is in place. Cardiomegaly with vascular congestion. IMPRESSION: LVAD placement. Left chest tube without pneumothorax. Endotracheal tube in expected position. Cardiomegaly, vascular congestion. Electronically Signed   By: Rolm Baptise M.D.   On: 09/09/2016 17:02     Medications:     Scheduled Medications: . sodium chloride   Intravenous Once  . sodium chloride   Intravenous Once  . acetaminophen  1,000 mg Oral Q6H   Or  . acetaminophen (TYLENOL) oral liquid 160 mg/5 mL  1,000 mg Per Tube Q6H  . aspirin EC  325 mg Oral Daily  . atorvastatin  20 mg Oral QHS  . bisacodyl  10  mg Oral Daily   Or  . bisacodyl  10 mg Rectal Daily  . cefUROXime (ZINACEF)  IV  1.5 g Intravenous Q12H  .  chlorhexidine  15 mL Mouth/Throat BID  . digoxin  250 mcg Oral Daily  . docusate sodium  200 mg Oral Daily  . escitalopram  20 mg Oral Daily  . furosemide  40 mg Intravenous Once  . insulin aspart  0-24 Units Subcutaneous Q4H  . insulin detemir  27 Units Subcutaneous BID  . mouth rinse  15 mL Mouth Rinse QID  . metoCLOPramide (REGLAN) injection  10 mg Intravenous Q6H  . metolazone  5 mg Oral Daily  . multivitamin with minerals  1 tablet Oral Daily  . pantoprazole  40 mg Oral Daily  . potassium chloride  10 mEq Intravenous Once  . sodium chloride flush  3 mL Intravenous Q12H  . warfarin  4 mg Oral q1800  . Warfarin - Physician Dosing Inpatient   Does not apply q1800    Infusions: . sodium chloride 20 mL/hr at 09/11/16 0700  . sodium chloride    . sodium chloride 20 mL/hr at 09/11/16 0700  . furosemide (LASIX) infusion 10 mg/hr (09/11/16 0848)  . lactated ringers 10 mL/hr at 09/11/16 0700  . lactated ringers 10 mL/hr (09/10/16 2030)  . milrinone 0.3 mcg/kg/min (09/11/16 0700)    PRN Medications: sodium chloride, albumin human, albuterol, Melatonin, ondansetron (ZOFRAN) IV, senna, sodium chloride flush, sodium chloride flush   Assessment/Plan/Discussion    1. 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  Echo 08/24/16 LVEF 15%, RV mild dilated, moderately reduced.  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 Agree with increased diuresis. - co-ox marginal will continue milrinone 2. AKI on CKD III: - Renal function stable. Follow closely.  3. Hypercarbic respiratory failure - Encourage IS. Limit sedation. Diurese.  4. CAD: No CP. 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  5. DMII: Hgb A1C 8.3 on 08/26/2016  - Cover with SSI  6. RLE cellulitis: Completed 8 days of Vancomycin.  7. Tobacco abuse:  8. Hypokalemia:  - Stable.  D/w Dr. Prescott Gum at bedside.     I reviewed  the LVAD parameters from today, and compared the results to the patient's prior recorded data.  No programming changes were made.  The LVAD is functioning within specified parameters.  The patient performs LVAD self-test daily.  LVAD interrogation was negative for any significant power changes, alarms or PI events/speed drops.  LVAD equipment check completed and is in good working order.  Back-up equipment present.   LVAD education done on emergency procedures and precautions and reviewed exit site care.  Length of Stay: 20  Glori Bickers MD 09/11/2016, 10:53 AM  VAD Team --- VAD ISSUES ONLY--- Pager [phone] (7am - 7am)  Advanced Heart Failure Team  Pager [phone] (M-F; 7a - 4p)  Please contact [facility] for night-coverage after hours (4p -7a ) and weekends on [email or web address]

## 2016-09-11 NOTE — Progress Notes (Signed)
HeartMate 3  Rounding Note Postop day #2 implantation of HeartMate 3 09-09-16 Subjective:    History of ischemic cardiomyopathy, EF 15% status post CABG 6 2001 History of COPD, substance abuse, AICD placement and moderate RV dysfunction Postoperative coagulopathy and acute blood loss anemia requiring transfusion Patient extubated postop day 1 with stable hemodynamics  LVAD INTERROGATION:  HeartMate II LVAD:  Flow 5.3 liters/min, speed 5600 RPM, power 4.2, PI 2.1.  Controller intact and connected  Objective:    Vital Signs:   Temp:  [97.7 F (36.5 C)-98.8 F (37.1 C)] 98.3 F (36.8 C) (12/09 1539) Pulse Rate:  [35-131] 102 (12/09 1700) Resp:  [0-29] 13 (12/09 1700) BP: (71-145)/(41-121) 100/84 (12/09 1700) SpO2:  [85 %-100 %] 100 % (12/09 1700) Arterial Line BP: (72-92)/(62-78) 87/74 (12/09 1700) Weight:  [245 lb 6 oz (111.3 kg)] 245 lb 6 oz (111.3 kg) (12/09 0500) Last BM Date: 09/08/16 Mean arterial Pressure 75 mmHg  Intake/Output:   Intake/Output Summary (Last 24 hours) at 09/11/16 1805 Last data filed at 09/11/16 1700  Gross per 24 hour  Intake          2802.88 ml  Output             3535 ml  Net          -732.12 ml     Physical Exam: General:  Well appearing. No resp difficulty HEENT: normal Neck: supple. No  JVP .Marland Kitchen No lymphadenopathy or thryomegaly appreciated. Cor: Mechanical heart sounds with LVAD hum present. Lungs: clear Abdomen: soft, nontender, nondistended. No hepatosplenomegaly. No bruits or masses. Good bowel sounds. Extremities: no cyanosis, clubbing, rash, edema Neuro: alert & orientedx3, cranial nerves grossly intact. moves all 4 extremities w/o difficulty. Affect pleasant  Telemetry: Sinus tachycardia  Labs: Basic Metabolic Panel:  Recent Labs Lab 09/08/16 0603 09/09/16 0450  09/09/16 1618 09/09/16 2019  09/10/16 0244 09/10/16 0959 09/10/16 1629 09/10/16 1751 09/11/16 0430 09/11/16 1013 09/11/16 1610  NA 133* 136  < > 137  --   < >  138 141 138  --  136 137 136  K 4.0 3.3*  < > 3.4*  --   < > 4.2 4.4 4.4  --  5.0 4.9 4.5  CL 96* 101  < > 104  --   < > 107 104 104  --  103 102 99*  CO2 29 26  --  26  --   --  21*  --   --   --  23  --   --   GLUCOSE 307* 237*  < > 137*  --   < > 171* 120* 216*  --  179* 150* 164*  BUN 17 15  < > 11  --   < > 11 12 14   --  16 20 20   CREATININE 1.05 0.89  < > 0.94 1.04  < > 1.08 0.90 1.10 1.20 1.31* 1.30* 1.30*  CALCIUM 9.1 8.0*  --  8.3*  --   --  8.4*  --   --   --  8.2*  --   --   MG 2.0  --   --  1.7 2.5*  --  2.2  --   --  2.0 2.0  --   --   PHOS  --   --   --   --   --   --  3.0  --   --   --  3.8  --   --   < > =  values in this interval not displayed.  Liver Function Tests:  Recent Labs Lab 09/10/16 0244 09/11/16 0430  AST 114* 151*  ALT 23 55  ALKPHOS 44 52  BILITOT 2.2* 1.9*  PROT 5.4* 6.0*  ALBUMIN 3.8 3.5   No results for input(s): LIPASE, AMYLASE in the last 168 hours. No results for input(s): AMMONIA in the last 168 hours.  CBC:  Recent Labs Lab 09/09/16 2130  09/10/16 0244 09/10/16 0955  09/10/16 1629 09/10/16 1751 09/11/16 0430 09/11/16 1013 09/11/16 1610  WBC 12.4*  --  16.9* 14.7*  --   --  15.6* 13.7*  --   --   NEUTROABS  --   --  15.1*  --   --   --   --  11.6*  --   --   HGB 7.6*  < > 9.5* 9.4*  < > 8.5* 9.4* 9.0* 8.5* 8.5*  HCT 21.5*  < > 27.9* 27.3*  < > 25.0* 27.0* 26.6* 25.0* 25.0*  MCV 88.8  --  89.1 87.8  --   --  89.4 92.0  --   --   PLT 87*  --  109* 95*  --   --  82* 70*  --   --   < > = values in this interval not displayed.  INR:  Recent Labs Lab 09/08/16 0603 09/09/16 1618 09/10/16 0244 09/11/16 0430  INR 1.02 1.57 1.06 1.32    Other results:  EKG:   Imaging: Dg Chest Port 1 View  Result Date: 09/11/2016 CLINICAL DATA:  LVAD. EXAM: PORTABLE CHEST 1 VIEW COMPARISON:  09/10/2016 FINDINGS: LVAD unchanged. Sternotomy wires and left-sided pacemaker unchanged. Right IJ Swan-Ganz catheter with tip over the main pulmonary  artery segment unchanged. Right IJ central venous catheter with tip over the SVC unchanged. Two left-sided chest tubes present. Mediastinal drain unchanged. The Lungs are adequately inflated without definite focal consolidation, effusion or pneumothorax. Mild stable cardiomegaly. Remainder of the exam is unchanged. IMPRESSION: No acute pulmonary disease.  Mild stable cardiomegaly. Tubes and lines as described.  LVAD present. Electronically Signed   By: Marin Olp M.D.   On: 09/11/2016 08:14   Dg Chest Port 1 View  Result Date: 09/10/2016 CLINICAL DATA:  Postop LVAD.  Chest tube, intubated EXAM: PORTABLE CHEST 1 VIEW COMPARISON:  09/09/2016 FINDINGS: Support devices remain in place, unchanged. No pneumothorax. LVAD and left chest tubes project over the left heart. Mild cardiomegaly. Left lower lobe opacity likely reflects atelectasis. Possible small left effusion. IMPRESSION: Support devices stable. No pneumothorax. Left lower lobe atelectasis and possible small left effusion. Electronically Signed   By: Rolm Baptise M.D.   On: 09/10/2016 08:07     Medications:     Scheduled Medications: . sodium chloride   Intravenous Once  . sodium chloride   Intravenous Once  . acetaminophen  1,000 mg Oral Q6H   Or  . acetaminophen (TYLENOL) oral liquid 160 mg/5 mL  1,000 mg Per Tube Q6H  . aspirin EC  325 mg Oral Daily  . atorvastatin  20 mg Oral QHS  . bisacodyl  10 mg Oral Daily   Or  . bisacodyl  10 mg Rectal Daily  . cefUROXime (ZINACEF)  IV  1.5 g Intravenous Q12H  . chlorhexidine  15 mL Mouth/Throat BID  . digoxin  250 mcg Oral Daily  . docusate sodium  200 mg Oral Daily  . escitalopram  20 mg Oral Daily  . insulin aspart  0-24 Units Subcutaneous Q4H  .  insulin detemir  27 Units Subcutaneous BID  . mouth rinse  15 mL Mouth Rinse QID  . metoCLOPramide (REGLAN) injection  10 mg Intravenous Q6H  . metolazone  5 mg Oral Daily  . multivitamin with minerals  1 tablet Oral Daily  . pantoprazole   40 mg Oral Daily  . potassium chloride  10 mEq Intravenous Once  . sodium chloride flush  3 mL Intravenous Q12H  . warfarin  4 mg Oral ONCE-1800  . Warfarin - Physician Dosing Inpatient   Does not apply q1800    Infusions: . sodium chloride Stopped (09/11/16 1000)  . sodium chloride    . sodium chloride 10 mL/hr at 09/11/16 1000  . furosemide (LASIX) infusion 10 mg/hr (09/11/16 1155)  . lactated ringers 10 mL/hr at 09/11/16 0800  . lactated ringers 10 mL/hr (09/10/16 2030)  . 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  No programming changes were made.  The LVAD is functioning within specified parameters.  The patient performs LVAD self-test daily.  LVAD interrogation was negative for any significant power changes, alarms or PI events/speed drops.  LVAD equipment check completed and is in good working order.  Back-up equipment present.   LVAD education done on emergency procedures and precautions and reviewed exit site care.  Length of Stay: [place] [facility] 09/11/2016, 6:05 PM

## 2016-09-11 NOTE — Anesthesia Postprocedure Evaluation (Signed)
Anesthesia Post Note  Patient: Ronald Miller.  Procedure(s) Performed: Procedure(s) (LRB): REDO STERNOTOMY (N/A) INSERTION OF IMPLANTABLE LEFT VENTRICULAR ASSIST DEVICE (N/A) TRANSESOPHAGEAL ECHOCARDIOGRAM (TEE) (N/A)  Patient location during evaluation: SICU Anesthesia Type: General Level of consciousness: patient remains intubated per anesthesia plan Pain management: pain level controlled Vital Signs Assessment: post-procedure vital signs reviewed and stable Respiratory status: patient on ventilator - see flowsheet for VS and patient remains intubated per anesthesia plan Cardiovascular status: blood pressure returned to baseline Anesthetic complications: no    Last Vitals:  Vitals:   09/11/16 2024 09/11/16 2100  BP:  (!) 88/58  Pulse:  (!) 111  Resp:  (!) 23  Temp: 36.8 C     Last Pain:  Vitals:   09/11/16 2100  TempSrc:   PainSc: 7                  Akeylah Hendel COKER

## 2016-09-12 ENCOUNTER — Inpatient Hospital Stay (HOSPITAL_COMMUNITY): Payer: Medicare HMO

## 2016-09-12 DIAGNOSIS — E876 Hypokalemia: Secondary | ICD-10-CM

## 2016-09-12 LAB — POCT I-STAT, CHEM 8
BUN: 21 mg/dL — AB (ref 6–20)
Calcium, Ion: 1.15 mmol/L (ref 1.15–1.40)
Chloride: 92 mmol/L — ABNORMAL LOW (ref 101–111)
Creatinine, Ser: 1.1 mg/dL (ref 0.61–1.24)
Glucose, Bld: 153 mg/dL — ABNORMAL HIGH (ref 65–99)
HEMATOCRIT: 25 % — AB (ref 39.0–52.0)
HEMOGLOBIN: 8.5 g/dL — AB (ref 13.0–17.0)
POTASSIUM: 3.1 mmol/L — AB (ref 3.5–5.1)
SODIUM: 135 mmol/L (ref 135–145)
TCO2: 30 mmol/L (ref 0–100)

## 2016-09-12 LAB — COMPREHENSIVE METABOLIC PANEL
ALT: 111 U/L — ABNORMAL HIGH (ref 17–63)
AST: 136 U/L — ABNORMAL HIGH (ref 15–41)
Albumin: 3.3 g/dL — ABNORMAL LOW (ref 3.5–5.0)
Alkaline Phosphatase: 55 U/L (ref 38–126)
Anion gap: 10 (ref 5–15)
BUN: 19 mg/dL (ref 6–20)
CO2: 28 mmol/L (ref 22–32)
Calcium: 8.2 mg/dL — ABNORMAL LOW (ref 8.9–10.3)
Chloride: 95 mmol/L — ABNORMAL LOW (ref 101–111)
Creatinine, Ser: 1.22 mg/dL (ref 0.61–1.24)
GFR calc Af Amer: >60 mL/min (ref >60)
GFR calc non Af Amer: >60 mL/min (ref >60)
Glucose, Bld: 119 mg/dL — ABNORMAL HIGH (ref 65–99)
Potassium: 3.3 mmol/L — ABNORMAL LOW (ref 3.5–5.1)
Sodium: 133 mmol/L — ABNORMAL LOW (ref 135–145)
Total Bilirubin: 1.5 mg/dL — ABNORMAL HIGH (ref 0.3–1.2)
Total Protein: 6 g/dL — ABNORMAL LOW (ref 6.5–8.1)

## 2016-09-12 LAB — COOXEMETRY PANEL
Carboxyhemoglobin: 1.8 % — ABNORMAL HIGH (ref 0.5–1.5)
Methemoglobin: 0.6 % (ref 0.0–1.5)
O2 Saturation: 64.1 %
Total hemoglobin: 7.9 g/dL — ABNORMAL LOW (ref 12.0–16.0)

## 2016-09-12 LAB — TYPE AND SCREEN
Blood Product Expiration Date: 201712212359
Blood Product Expiration Date: 201712212359
Blood Product Expiration Date: 201712252359
Blood Product Expiration Date: 201712252359
Blood Product Expiration Date: 201712252359
ISSUE DATE / TIME: 201712070623
ISSUE DATE / TIME: 201712070623
ISSUE DATE / TIME: 201712072316
ISSUE DATE / TIME: 201712072316
Unit Type and Rh: 5100
Unit Type and Rh: 5100
Unit Type and Rh: 5100
Unit Type and Rh: 5100
Unit Type and Rh: 5100

## 2016-09-12 LAB — GLUCOSE, CAPILLARY
GLUCOSE-CAPILLARY: 83 mg/dL (ref 65–99)
Glucose-Capillary: 109 mg/dL — ABNORMAL HIGH (ref 65–99)
Glucose-Capillary: 135 mg/dL — ABNORMAL HIGH (ref 65–99)
Glucose-Capillary: 148 mg/dL — ABNORMAL HIGH (ref 65–99)
Glucose-Capillary: 152 mg/dL — ABNORMAL HIGH (ref 65–99)
Glucose-Capillary: 84 mg/dL (ref 65–99)

## 2016-09-12 LAB — PROTIME-INR
INR: 1.28
Prothrombin Time: 16.1 seconds — ABNORMAL HIGH (ref 11.4–15.2)

## 2016-09-12 LAB — CBC WITH DIFFERENTIAL/PLATELET
Basophils Absolute: 0 10*3/uL (ref 0.0–0.1)
Basophils Relative: 0 %
Eosinophils Absolute: 0 10*3/uL (ref 0.0–0.7)
Eosinophils Relative: 0 %
HCT: 24.4 % — ABNORMAL LOW (ref 39.0–52.0)
Hemoglobin: 8.4 g/dL — ABNORMAL LOW (ref 13.0–17.0)
Lymphocytes Relative: 7 %
Lymphs Abs: 0.8 10*3/uL (ref 0.7–4.0)
MCH: 31.3 pg (ref 26.0–34.0)
MCHC: 34.4 g/dL (ref 30.0–36.0)
MCV: 91 fL (ref 78.0–100.0)
Monocytes Absolute: 0.8 10*3/uL (ref 0.1–1.0)
Monocytes Relative: 7 %
Neutro Abs: 10 10*3/uL — ABNORMAL HIGH (ref 1.7–7.7)
Neutrophils Relative %: 87 %
Platelets: 62 10*3/uL — ABNORMAL LOW (ref 150–400)
RBC: 2.68 MIL/uL — ABNORMAL LOW (ref 4.22–5.81)
RDW: 14 % (ref 11.5–15.5)
WBC: 11.6 10*3/uL — ABNORMAL HIGH (ref 4.0–10.5)

## 2016-09-12 LAB — PHOSPHORUS: Phosphorus: 2.7 mg/dL (ref 2.5–4.6)

## 2016-09-12 LAB — LACTATE DEHYDROGENASE: LDH: 425 U/L — ABNORMAL HIGH (ref 98–192)

## 2016-09-12 LAB — MAGNESIUM: Magnesium: 1.8 mg/dL (ref 1.7–2.4)

## 2016-09-12 MED ORDER — SPIRONOLACTONE 25 MG PO TABS
25.0000 mg | ORAL_TABLET | Freq: Every day | ORAL | Status: DC
  Administered 2016-09-12 – 2016-09-25 (×14): 25 mg via ORAL
  Filled 2016-09-12 (×14): qty 1

## 2016-09-12 MED ORDER — MAGNESIUM SULFATE 2 GM/50ML IV SOLN
2.0000 g | Freq: Once | INTRAVENOUS | Status: AC
  Administered 2016-09-12: 2 g via INTRAVENOUS
  Filled 2016-09-12: qty 50

## 2016-09-12 MED ORDER — POTASSIUM CHLORIDE CRYS ER 20 MEQ PO TBCR: 40.0000 meq | EXTENDED_RELEASE_TABLET | Freq: Every day | ORAL | Status: DC

## 2016-09-12 MED ORDER — WARFARIN SODIUM 5 MG PO TABS
5.0000 mg | ORAL_TABLET | Freq: Every day | ORAL | Status: AC
  Administered 2016-09-12: 5 mg via ORAL
  Filled 2016-09-12: qty 1

## 2016-09-12 MED ORDER — SODIUM CHLORIDE 0.9% FLUSH: 10.0000 mL | INTRAVENOUS | Status: DC | PRN

## 2016-09-12 MED ORDER — METOLAZONE 5 MG PO TABS
2.5000 mg | ORAL_TABLET | Freq: Every day | ORAL | Status: DC
  Administered 2016-09-12: 2.5 mg via ORAL
  Filled 2016-09-12: qty 1

## 2016-09-12 MED ORDER — SODIUM CHLORIDE 0.9% FLUSH
10.0000 mL | Freq: Two times a day (BID) | INTRAVENOUS | Status: DC
  Administered 2016-09-12 (×2): 10 mL

## 2016-09-12 MED ORDER — FENTANYL CITRATE (PF) 100 MCG/2ML IJ SOLN
INTRAMUSCULAR | Status: AC
  Filled 2016-09-12: qty 2

## 2016-09-12 MED ORDER — POTASSIUM CHLORIDE CRYS ER 20 MEQ PO TBCR
40.0000 meq | EXTENDED_RELEASE_TABLET | Freq: Once | ORAL | Status: AC
Start: 2016-09-12 — End: 2016-09-12
  Administered 2016-09-12: 40 meq via ORAL
  Filled 2016-09-12: qty 2

## 2016-09-12 MED ORDER — SODIUM CHLORIDE 0.9 % IV SOLN
30.0000 meq | Freq: Once | INTRAVENOUS | Status: AC
  Administered 2016-09-12: 30 meq via INTRAVENOUS
  Filled 2016-09-12: qty 15

## 2016-09-12 MED ORDER — ASPIRIN EC 81 MG PO TBEC
81.0000 mg | DELAYED_RELEASE_TABLET | Freq: Every day | ORAL | Status: DC
  Administered 2016-09-12 – 2016-09-13 (×2): 81 mg via ORAL
  Filled 2016-09-12 (×2): qty 1

## 2016-09-12 MED ORDER — FENTANYL CITRATE (PF) 100 MCG/2ML IJ SOLN
50.0000 ug | INTRAMUSCULAR | Status: DC | PRN
  Administered 2016-09-12 – 2016-09-13 (×5): 50 ug via INTRAVENOUS
  Filled 2016-09-12 (×4): qty 2

## 2016-09-12 MED ORDER — ORAL CARE MOUTH RINSE
15.0000 mL | Freq: Two times a day (BID) | OROMUCOSAL | Status: DC
  Administered 2016-09-14 – 2016-09-24 (×12): 15 mL via OROMUCOSAL

## 2016-09-12 NOTE — Progress Notes (Signed)
CT surgery p.m. Rounds  Patient had eventful day-PICC line placed Aggressive diuresis Stable VAD parameters Patient ambulated 300 feet in hallway P.m. labs are satisfactory-hematocrit stable at 25%, potassium being supplemented with 30 mEq IV over 3 hours

## 2016-09-12 NOTE — Progress Notes (Signed)
HeartMate 3  Rounding Note Postop day #2 implantation of HeartMate 3 09-09-16 Subjective:    History of ischemic cardiomyopathy, EF 15% status post CABG 6 2001 History of COPD, substance abuse, AICD placement and moderate RV dysfunction Postoperative coagulopathy and acute blood loss anemia requiring transfusion Patient extubated postop day 1 with stable hemodynamics  LVAD INTERROGATION:  HeartMate II LVAD:  Flow 5.3 liters/min, speed 5600 RPM, power 4.1, PI 2.2.  Controller intact and connected  Objective:    Vital Signs:   Temp:  [97.7 F (36.5 C)-98.3 F (36.8 C)] 98.2 F (36.8 C) (12/10 0448) Pulse Rate:  [35-112] 99 (12/10 0600) Resp:  [0-29] 16 (12/10 0700) BP: (73-145)/(41-121) 84/63 (12/10 0700) SpO2:  [89 %-100 %] 100 % (12/10 0700) Arterial Line BP: (75-95)/(62-85) 91/79 (12/10 0700) Weight:  [241 lb 10 oz (109.6 kg)] 241 lb 10 oz (109.6 kg) (12/10 0500) Last BM Date: 09/08/16 Mean arterial Pressure 75 mmHg  Intake/Output:   Intake/Output Summary (Last 24 hours) at 09/12/16 0923 Last data filed at 09/12/16 0700  Gross per 24 hour  Intake           1860.8 ml  Output             6210 ml  Net          -4349.2 ml     Physical Exam: General:  Well appearing. No resp difficulty HEENT: normal Neck: supple. No  JVP .Marland Kitchen No lymphadenopathy or thryomegaly appreciated. Cor: Mechanical heart sounds with LVAD hum present. Lungs: clear Abdomen: soft, nontender, nondistended. No hepatosplenomegaly. No bruits or masses. Good bowel sounds. Extremities: no cyanosis, clubbing, rash, edema Neuro: alert & orientedx3, cranial nerves grossly intact. moves all 4 extremities w/o difficulty. Affect pleasant  Telemetry: Sinus tachycardia  Labs: Basic Metabolic Panel:  Recent Labs Lab 09/09/16 0450  09/09/16 1618 09/09/16 2019  09/10/16 0244  09/10/16 1629 09/10/16 1751 09/11/16 0430 09/11/16 1013 09/11/16 1610 09/12/16 0438  NA 136  < > 137  --   < > 138  < > 138  --   136 137 136 133*  K 3.3*  < > 3.4*  --   < > 4.2  < > 4.4  --  5.0 4.9 4.5 3.3*  CL 101  < > 104  --   < > 107  < > 104  --  103 102 99* 95*  CO2 26  --  26  --   --  21*  --   --   --  23  --   --  28  GLUCOSE 237*  < > 137*  --   < > 171*  < > 216*  --  179* 150* 164* 119*  BUN 15  < > 11  --   < > 11  < > 14  --  16 20 20 19   CREATININE 0.89  < > 0.94 1.04  < > 1.08  < > 1.10 1.20 1.31* 1.30* 1.30* 1.22  CALCIUM 8.0*  --  8.3*  --   --  8.4*  --   --   --  8.2*  --   --  8.2*  MG  --   --  1.7 2.5*  --  2.2  --   --  2.0 2.0  --   --  1.8  PHOS  --   --   --   --   --  3.0  --   --   --  3.8  --   --  2.7  < > = values in this interval not displayed.  Liver Function Tests:  Recent Labs Lab 09/10/16 0244 09/11/16 0430 09/12/16 0438  AST 114* 151* 136*  ALT 23 55 111*  ALKPHOS 44 52 55  BILITOT 2.2* 1.9* 1.5*  PROT 5.4* 6.0* 6.0*  ALBUMIN 3.8 3.5 3.3*   No results for input(s): LIPASE, AMYLASE in the last 168 hours. No results for input(s): AMMONIA in the last 168 hours.  CBC:  Recent Labs Lab 09/10/16 0244 09/10/16 0955  09/10/16 1751 09/11/16 0430 09/11/16 1013 09/11/16 1610 09/12/16 0438  WBC 16.9* 14.7*  --  15.6* 13.7*  --   --  11.6*  NEUTROABS 15.1*  --   --   --  11.6*  --   --  10.0*  HGB 9.5* 9.4*  < > 9.4* 9.0* 8.5* 8.5* 8.4*  HCT 27.9* 27.3*  < > 27.0* 26.6* 25.0* 25.0* 24.4*  MCV 89.1 87.8  --  89.4 92.0  --   --  91.0  PLT 109* 95*  --  82* 70*  --   --  62*  < > = values in this interval not displayed.  INR:  Recent Labs Lab 09/08/16 0603 09/09/16 1618 09/10/16 0244 09/11/16 0430 09/12/16 0438  INR 1.02 1.57 1.06 1.32 1.28    Other results:  EKG:   Imaging: Dg Chest Port 1 View  Result Date: 09/11/2016 CLINICAL DATA:  LVAD. EXAM: PORTABLE CHEST 1 VIEW COMPARISON:  09/10/2016 FINDINGS: LVAD unchanged. Sternotomy wires and left-sided pacemaker unchanged. Right IJ Swan-Ganz catheter with tip over the main pulmonary artery segment  unchanged. Right IJ central venous catheter with tip over the SVC unchanged. Two left-sided chest tubes present. Mediastinal drain unchanged. The Lungs are adequately inflated without definite focal consolidation, effusion or pneumothorax. Mild stable cardiomegaly. Remainder of the exam is unchanged. IMPRESSION: No acute pulmonary disease.  Mild stable cardiomegaly. Tubes and lines as described.  LVAD present. Electronically Signed   By: Marin Olp M.D.   On: 09/11/2016 08:14     Medications:     Scheduled Medications: . sodium chloride   Intravenous Once  . sodium chloride   Intravenous Once  . acetaminophen  1,000 mg Oral Q6H   Or  . acetaminophen (TYLENOL) oral liquid 160 mg/5 mL  1,000 mg Per Tube Q6H  . aspirin EC  81 mg Oral Daily  . atorvastatin  20 mg Oral QHS  . bisacodyl  10 mg Oral Daily   Or  . bisacodyl  10 mg Rectal Daily  . cefUROXime (ZINACEF)  IV  1.5 g Intravenous Q12H  . chlorhexidine  15 mL Mouth/Throat BID  . digoxin  250 mcg Oral Daily  . docusate sodium  200 mg Oral Daily  . escitalopram  20 mg Oral Daily  . insulin aspart  0-24 Units Subcutaneous Q4H  . insulin detemir  27 Units Subcutaneous BID  . magnesium sulfate 1 - 4 g bolus IVPB  2 g Intravenous Once  . mouth rinse  15 mL Mouth Rinse QID  . metoCLOPramide (REGLAN) injection  10 mg Intravenous Q6H  . metolazone  2.5 mg Oral Daily  . multivitamin with minerals  1 tablet Oral Daily  . pantoprazole  40 mg Oral Daily  . potassium chloride  10 mEq Intravenous Once  . potassium chloride (KCL MULTIRUN) 30 mEq in 265 mL IVPB  30 mEq Intravenous Once  . sodium chloride flush  3  mL Intravenous Q12H  . warfarin  5 mg Oral q1800  . Warfarin - Physician Dosing Inpatient   Does not apply q1800    Infusions: . sodium chloride Stopped (09/11/16 1000)  . sodium chloride    . sodium chloride 10 mL/hr at 09/12/16 0400  . furosemide (LASIX) infusion 10 mg/hr (09/12/16 0400)  . lactated ringers 10 mL/hr at  09/12/16 0400  . lactated ringers 10 mL/hr (09/10/16 2030)  . 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  No programming changes were made.  The LVAD is functioning within specified parameters.  The patient performs LVAD self-test daily.  LVAD interrogation was negative for any significant power changes, alarms or PI events/speed drops.  LVAD equipment check completed and is in good working order.  Back-up equipment present.   LVAD education done on emergency procedures and precautions and reviewed exit site care.  Length of Stay: [place] III 09/12/2016, 9:23 AM

## 2016-09-12 NOTE — Progress Notes (Signed)
HeartMate 2 Rounding Note  Subjective:    S/P HMII Post Op Day 3  Feels better today. Appetite returning. Passing a little gas. Chest sore. CTs still draining mildly.   Off norepi and epi. Remains on milrinone. MAPs 70s. Diuresing well. Weight down 4 pounds. Renal function stable.    CO-OX 56%->64%  LVAD INTERROGATION:  HeartMate III LVAD:  Flow 5.1 liters/min, speed 5600, power 4.2, PI 2.7.  Occasional PI  Objective:    Vital Signs:   Temp:  [97.7 F (36.5 C)-98.4 F (36.9 C)] 98.4 F (36.9 C) (12/10 1124) Pulse Rate:  [63-112] 103 (12/10 1022) Resp:  [0-29] 16 (12/10 0700) BP: (73-145)/(41-121) 84/63 (12/10 0700) SpO2:  [89 %-100 %] 100 % (12/10 0700) Arterial Line BP: (78-95)/(62-85) 91/79 (12/10 0700) Weight:  [109.6 kg (241 lb 10 oz)] 109.6 kg (241 lb 10 oz) (12/10 0500) Last BM Date: 09/08/16 Mean arterial Pressure 70s  Intake/Output:   Intake/Output Summary (Last 24 hours) at 09/12/16 1148 Last data filed at 09/12/16 0700  Gross per 24 hour  Intake           1641.2 ml  Output             5905 ml  Net          -4263.8 ml     Physical Exam: CVP 12-14 General:  Awake Lying in bed. NAD HEENT: normal Neck: supple. JVP to jaw . +IJ cordis Carotids 2+ bilat; no bruits. No lymphadenopathy or thryomegaly appreciated. RIJ swan  Cor: Mechanical heart sounds with LVAD hum present. + CTs Lungs: clear Abdomen: soft, nontender, + distended. Minimal BS Driveline: C/D/I; securement device intact and driveline incorporated Extremities: no cyanosis, clubbing, rash, 1-2+ edema Neuro: awake and oriented. nonfocal GU: Foley  Telemetry: V paced 100s   Labs: Basic Metabolic Panel:  Recent Labs Lab 09/09/16 0450  09/09/16 1618 09/09/16 2019  09/10/16 0244  09/10/16 1629 09/10/16 1751 09/11/16 0430 09/11/16 1013 09/11/16 1610 09/12/16 0438  NA 136  < > 137  --   < > 138  < > 138  --  136 137 136 133*  K 3.3*  < > 3.4*  --   < > 4.2  < > 4.4  --  5.0 4.9 4.5  3.3*  CL 101  < > 104  --   < > 107  < > 104  --  103 102 99* 95*  CO2 26  --  26  --   --  21*  --   --   --  23  --   --  28  GLUCOSE 237*  < > 137*  --   < > 171*  < > 216*  --  179* 150* 164* 119*  BUN 15  < > 11  --   < > 11  < > 14  --  16 20 20 19   CREATININE 0.89  < > 0.94 1.04  < > 1.08  < > 1.10 1.20 1.31* 1.30* 1.30* 1.22  CALCIUM 8.0*  --  8.3*  --   --  8.4*  --   --   --  8.2*  --   --  8.2*  MG  --   --  1.7 2.5*  --  2.2  --   --  2.0 2.0  --   --  1.8  PHOS  --   --   --   --   --  3.0  --   --   --  3.8  --   --  2.7  < > = values in this interval not displayed.  Liver Function Tests:  Recent Labs Lab 09/10/16 0244 09/11/16 0430 09/12/16 0438  AST 114* 151* 136*  ALT 23 55 111*  ALKPHOS 44 52 55  BILITOT 2.2* 1.9* 1.5*  PROT 5.4* 6.0* 6.0*  ALBUMIN 3.8 3.5 3.3*   No results for input(s): LIPASE, AMYLASE in the last 168 hours. No results for input(s): AMMONIA in the last 168 hours.  CBC:  Recent Labs Lab 09/10/16 0244 09/10/16 0955  09/10/16 1751 09/11/16 0430 09/11/16 1013 09/11/16 1610 09/12/16 0438  WBC 16.9* 14.7*  --  15.6* 13.7*  --   --  11.6*  NEUTROABS 15.1*  --   --   --  11.6*  --   --  10.0*  HGB 9.5* 9.4*  < > 9.4* 9.0* 8.5* 8.5* 8.4*  HCT 27.9* 27.3*  < > 27.0* 26.6* 25.0* 25.0* 24.4*  MCV 89.1 87.8  --  89.4 92.0  --   --  91.0  PLT 109* 95*  --  82* 70*  --   --  62*  < > = values in this interval not displayed.  INR:  Recent Labs Lab 09/08/16 0603 09/09/16 1618 09/10/16 0244 09/11/16 0430 09/12/16 0438  INR 1.02 1.57 1.06 1.32 1.28    Other results:    Imaging: Dg Chest Port 1 View  Result Date: 09/12/2016 CLINICAL DATA:  PICC line placement EXAM: PORTABLE CHEST 1 VIEW COMPARISON:  Earlier same day; 09/11/2016 FINDINGS: Examination is degraded secondary to exclusion of the bilateral costophrenic angles. Interval placement of right upper extremity approach PICC line with tip projected over the superior cavoatrial  junction. Otherwise, stable position of support apparatus. No pneumothorax. Grossly unchanged bibasilar heterogeneous / consolidative opacities, left greater than right. No definite pleural effusion, though note, the bilateral costophrenic angles are excluded from view. Pulmonary is congestion without frank evidence of edema. No pneumothorax. Unchanged bones. IMPRESSION: 1. Right upper extremity approach PICC line tip projects over the superior cavoatrial junction. 2. Otherwise, stable positioning remaining support apparatus. No pneumothorax. Electronically Signed   By: Sandi Mariscal M.D.   On: 09/12/2016 10:33   Dg Chest Port 1 View  Result Date: 09/12/2016 CLINICAL DATA:  LVAD placed 09/09/2016 EXAM: PORTABLE CHEST 1 VIEW COMPARISON:  09/11/2016; 09/10/2016; 09/09/2016; 09/08/2016 FINDINGS: Grossly unchanged cardiac silhouette and mediastinal contours. Post median sternotomy. Stable position of support apparatus including left-sided chest tube. No pneumothorax. Pulmonary venous congestion without frank evidence of edema. Bibasilar opacities are unchanged, left greater than right. No new focal airspace opacities. No definite pleural effusion. Unchanged bones. IMPRESSION: 1.  Stable positioning of support apparatus.  No pneumothorax. 2. Bibasilar atelectasis without acute cardiopulmonary disease. Electronically Signed   By: Sandi Mariscal M.D.   On: 09/12/2016 09:32   Dg Chest Port 1 View  Result Date: 09/11/2016 CLINICAL DATA:  LVAD. EXAM: PORTABLE CHEST 1 VIEW COMPARISON:  09/10/2016 FINDINGS: LVAD unchanged. Sternotomy wires and left-sided pacemaker unchanged. Right IJ Swan-Ganz catheter with tip over the main pulmonary artery segment unchanged. Right IJ central venous catheter with tip over the SVC unchanged. Two left-sided chest tubes present. Mediastinal drain unchanged. The Lungs are adequately inflated without definite focal consolidation, effusion or pneumothorax. Mild stable cardiomegaly. Remainder of  the exam is unchanged. IMPRESSION: No acute pulmonary disease.  Mild stable cardiomegaly. Tubes and lines as described.  LVAD present. Electronically Signed   By: Marin Olp M.D.  On: 09/11/2016 08:14     Medications:     Scheduled Medications: . sodium chloride   Intravenous Once  . sodium chloride   Intravenous Once  . acetaminophen  1,000 mg Oral Q6H   Or  . acetaminophen (TYLENOL) oral liquid 160 mg/5 mL  1,000 mg Per Tube Q6H  . aspirin EC  81 mg Oral Daily  . atorvastatin  20 mg Oral QHS  . bisacodyl  10 mg Oral Daily   Or  . bisacodyl  10 mg Rectal Daily  . cefUROXime (ZINACEF)  IV  1.5 g Intravenous Q12H  . chlorhexidine  15 mL Mouth/Throat BID  . digoxin  250 mcg Oral Daily  . docusate sodium  200 mg Oral Daily  . escitalopram  20 mg Oral Daily  . insulin aspart  0-24 Units Subcutaneous Q4H  . insulin detemir  27 Units Subcutaneous BID  . magnesium sulfate 1 - 4 g bolus IVPB  2 g Intravenous Once  . mouth rinse  15 mL Mouth Rinse QID  . metoCLOPramide (REGLAN) injection  10 mg Intravenous Q6H  . metolazone  2.5 mg Oral Daily  . multivitamin with minerals  1 tablet Oral Daily  . pantoprazole  40 mg Oral Daily  . potassium chloride  10 mEq Intravenous Once  . sodium chloride flush  10-40 mL Intracatheter Q12H  . sodium chloride flush  3 mL Intravenous Q12H  . warfarin  5 mg Oral q1800  . Warfarin - Physician Dosing Inpatient   Does not apply q1800    Infusions: . sodium chloride Stopped (09/11/16 1000)  . sodium chloride    . sodium chloride 10 mL/hr at 09/12/16 0400  . furosemide (LASIX) infusion 10 mg/hr (09/12/16 0400)  . lactated ringers 10 mL/hr at 09/12/16 0400  . lactated ringers 10 mL/hr (09/10/16 2030)  . milrinone 0.3 mcg/kg/min (09/12/16 0600)    PRN Medications: sodium chloride, albumin human, albuterol, fentaNYL (SUBLIMAZE) injection, Melatonin, ondansetron (ZOFRAN) IV, oxyCODONE, senna, sodium chloride flush, sodium chloride flush, sodium  chloride flush   Assessment/Plan/Discussion    1. 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  Echo 08/24/16 LVEF 15%, RV mild dilated, moderately reduced.  --> S/P HMIII LVAD 09/09/2016  - he is volume overloaded. Continue diuresis. - co-ox improving will continue milrinone until fully diuresesd - VAD parameters and renal function stable - mobilize 2. AKI on CKD III: - Renal function stable. Follow closely.  3. Hypercarbic respiratory failure - Encourage IS. Limit sedation. Diurese.  4. CAD: No CP. 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  5. DMII: Hgb A1C 8.3 on 08/26/2016  - Cover with SSI  6. RLE cellulitis: Completed 8 days of Vancomycin.  7. Tobacco abuse:  8. Hypokalemia:  - Will supp. Start spiro.   D/w Dr. Prescott Gum at bedside.     I reviewed the LVAD parameters from today, and compared the results to the patient's prior recorded data.  No programming changes were made.  The LVAD is functioning within specified parameters.  The patient performs LVAD self-test daily.  LVAD interrogation was negative for any significant power changes, alarms or PI events/speed drops.  LVAD equipment check completed and is in good working order.  Back-up equipment present.   LVAD education done on emergency procedures and precautions and reviewed exit site care.  Length of Stay: 21  Glori Bickers MD 09/12/2016, 11:48 AM  VAD Team ---  VAD ISSUES ONLY--- Pager [phone] (7am - 7am)  Advanced Heart Failure Team  Pager [phone] (M-F; [place])  Please contact [facility] for night-coverage after hours (4p -7a ) and weekends on [email or web address]

## 2016-09-12 NOTE — Progress Notes (Signed)
Peripherally Inserted Central Catheter/Midline Placement  The IV Nurse has discussed with the patient and/or persons authorized to consent for the patient, the purpose of this procedure and the potential benefits and risks involved with this procedure.  The benefits include less needle sticks, lab draws from the catheter, and the patient may be discharged home with the catheter. Risks include, but not limited to, infection, bleeding, blood clot (thrombus formation), and puncture of an artery; nerve damage and irregular heartbeat and possibility to perform a PICC exchange if needed/ordered by physician.  Alternatives to this procedure were also discussed.  Bard Power PICC patient education guide, fact sheet on infection prevention and patient information card has been provided to patient /or left at bedside.    PICC/Midline Placement Documentation        Severino Paolo, Nicolette Bang 09/12/2016, 9:56 AM

## 2016-09-13 ENCOUNTER — Inpatient Hospital Stay (HOSPITAL_COMMUNITY): Payer: Medicare HMO

## 2016-09-13 LAB — GLUCOSE, CAPILLARY
GLUCOSE-CAPILLARY: 82 mg/dL (ref 65–99)
Glucose-Capillary: 126 mg/dL — ABNORMAL HIGH (ref 65–99)
Glucose-Capillary: 140 mg/dL — ABNORMAL HIGH (ref 65–99)
Glucose-Capillary: 167 mg/dL — ABNORMAL HIGH (ref 65–99)
Glucose-Capillary: 78 mg/dL (ref 65–99)

## 2016-09-13 LAB — POCT I-STAT, CHEM 8
BUN: 23 mg/dL — ABNORMAL HIGH (ref 6–20)
CREATININE: 1.1 mg/dL (ref 0.61–1.24)
Calcium, Ion: 1.1 mmol/L — ABNORMAL LOW (ref 1.15–1.40)
Chloride: 86 mmol/L — ABNORMAL LOW (ref 101–111)
GLUCOSE: 175 mg/dL — AB (ref 65–99)
HCT: 26 % — ABNORMAL LOW (ref 39.0–52.0)
Hemoglobin: 8.8 g/dL — ABNORMAL LOW (ref 13.0–17.0)
POTASSIUM: 3.3 mmol/L — AB (ref 3.5–5.1)
Sodium: 134 mmol/L — ABNORMAL LOW (ref 135–145)
TCO2: 34 mmol/L (ref 0–100)

## 2016-09-13 LAB — LACTATE DEHYDROGENASE: LDH: 337 U/L — ABNORMAL HIGH (ref 98–192)

## 2016-09-13 LAB — CBC WITH DIFFERENTIAL/PLATELET
Basophils Absolute: 0 10*3/uL (ref 0.0–0.1)
Basophils Relative: 0 %
Eosinophils Absolute: 0.1 10*3/uL (ref 0.0–0.7)
Eosinophils Relative: 1 %
HCT: 25.2 % — ABNORMAL LOW (ref 39.0–52.0)
Hemoglobin: 8.6 g/dL — ABNORMAL LOW (ref 13.0–17.0)
Lymphocytes Relative: 7 %
Lymphs Abs: 0.8 10*3/uL (ref 0.7–4.0)
MCH: 31.2 pg (ref 26.0–34.0)
MCHC: 34.1 g/dL (ref 30.0–36.0)
MCV: 91.3 fL (ref 78.0–100.0)
Monocytes Absolute: 1.1 10*3/uL — ABNORMAL HIGH (ref 0.1–1.0)
Monocytes Relative: 9 %
Neutro Abs: 9.6 10*3/uL — ABNORMAL HIGH (ref 1.7–7.7)
Neutrophils Relative %: 83 %
Platelets: 85 10*3/uL — ABNORMAL LOW (ref 150–400)
RBC: 2.76 MIL/uL — ABNORMAL LOW (ref 4.22–5.81)
RDW: 13.8 % (ref 11.5–15.5)
WBC: 11.5 10*3/uL — ABNORMAL HIGH (ref 4.0–10.5)

## 2016-09-13 LAB — MAGNESIUM: Magnesium: 1.8 mg/dL (ref 1.7–2.4)

## 2016-09-13 LAB — BASIC METABOLIC PANEL
Anion gap: 11 (ref 5–15)
BUN: 21 mg/dL — AB (ref 6–20)
CHLORIDE: 91 mmol/L — AB (ref 101–111)
CO2: 34 mmol/L — ABNORMAL HIGH (ref 22–32)
CREATININE: 1.12 mg/dL (ref 0.61–1.24)
Calcium: 8.3 mg/dL — ABNORMAL LOW (ref 8.9–10.3)
GFR calc Af Amer: >60 mL/min (ref >60)
GFR calc non Af Amer: >60 mL/min (ref >60)
Glucose, Bld: 82 mg/dL (ref 65–99)
Potassium: 2.7 mmol/L — CL (ref 3.5–5.1)
SODIUM: 136 mmol/L (ref 135–145)

## 2016-09-13 LAB — COOXEMETRY PANEL
Carboxyhemoglobin: 1.2 % (ref 0.5–1.5)
Methemoglobin: 0.8 % (ref 0.0–1.5)
O2 Saturation: 68.1 %
Total hemoglobin: 8.2 g/dL — ABNORMAL LOW (ref 12.0–16.0)

## 2016-09-13 LAB — PROTIME-INR
INR: 1.24
Prothrombin Time: 15.7 seconds — ABNORMAL HIGH (ref 11.4–15.2)

## 2016-09-13 LAB — APTT
APTT: 54 s — AB (ref 24–36)
aPTT: 62 seconds — ABNORMAL HIGH (ref 24–36)

## 2016-09-13 MED ORDER — SODIUM CHLORIDE 0.9 % IV SOLN
30.0000 meq | Freq: Once | INTRAVENOUS | Status: AC
  Administered 2016-09-13: 30 meq via INTRAVENOUS
  Filled 2016-09-13: qty 15

## 2016-09-13 MED ORDER — WARFARIN SODIUM 7.5 MG PO TABS
7.5000 mg | ORAL_TABLET | Freq: Every day | ORAL | Status: AC
  Administered 2016-09-13: 7.5 mg via ORAL
  Filled 2016-09-13: qty 1

## 2016-09-13 MED ORDER — FUROSEMIDE 10 MG/ML IJ SOLN
20.0000 mg | Freq: Two times a day (BID) | INTRAMUSCULAR | Status: DC
  Administered 2016-09-13 – 2016-09-15 (×5): 20 mg via INTRAVENOUS
  Filled 2016-09-13 (×5): qty 2

## 2016-09-13 MED ORDER — INSULIN DETEMIR 100 UNIT/ML ~~LOC~~ SOLN
24.0000 [IU] | Freq: Two times a day (BID) | SUBCUTANEOUS | Status: DC
  Administered 2016-09-13 (×2): 24 [IU] via SUBCUTANEOUS
  Filled 2016-09-13 (×4): qty 0.24

## 2016-09-13 MED ORDER — BIVALIRUDIN 250 MG IV SOLR
0.0300 mg/kg/h | INTRAVENOUS | Status: DC
  Administered 2016-09-13: 0.05 mg/kg/h via INTRAVENOUS
  Administered 2016-09-14: 0.04 mg/kg/h via INTRAVENOUS
  Administered 2016-09-18: 0.03 mg/kg/h via INTRAVENOUS
  Filled 2016-09-13 (×3): qty 250

## 2016-09-13 MED ORDER — POTASSIUM CHLORIDE CRYS ER 20 MEQ PO TBCR
40.0000 meq | EXTENDED_RELEASE_TABLET | Freq: Two times a day (BID) | ORAL | Status: DC
  Administered 2016-09-13 – 2016-09-16 (×7): 40 meq via ORAL
  Filled 2016-09-13 (×7): qty 2

## 2016-09-13 NOTE — Progress Notes (Signed)
HeartMate 3  Rounding Note Postop day #4 implantation of HeartMate 3  [09-09-16] Subjective:    History of ischemic cardiomyopathy, EF 15% status post CABG 6 2001 History of COPD, substance abuse, AICD placement and moderate RV dysfunction Postoperative coagulopathy and acute blood loss anemia requiring transfusion and blood product administration Patient extubated postop day 1 with stable hemodynamics Excellent diuresis on lasix drip , now stopped Coumadin dosing Started however INR remains subtherapeutic-IV bivalirudin started because of low postop platelet count and to avoid heparin exposure  LVAD INTERROGATION:  HeartMate II LVAD:  Flow 5.3 liters/min, speed 5600 RPM, power 4.1, PI 2.2.  Controller intact and connected  Objective:    Vital Signs:   Temp:  [98.1 F (36.7 C)-98.7 F (37.1 C)] 98.1 F (36.7 C) (12/11 1118) Pulse Rate:  [30-115] 110 (12/11 1300) Resp:  [7-21] 15 (12/11 1300) BP: (74-119)/(39-100) 75/50 (12/11 1300) SpO2:  [93 %-100 %] 99 % (12/11 1300) Weight:  [227 lb 15.3 oz (103.4 kg)] 227 lb 15.3 oz (103.4 kg) (12/11 0500) Last BM Date: 09/08/16 Mean arterial Pressure 75 mmHg  Intake/Output:   Intake/Output Summary (Last 24 hours) at 09/13/16 1355 Last data filed at 09/13/16 1300  Gross per 24 hour  Intake          1320.62 ml  Output             6145 ml  Net         -4824.38 ml     Physical Exam: General:  Well appearing. No resp difficulty HEENT: normal Neck: supple. No  JVP .Marland Kitchen No lymphadenopathy or thryomegaly appreciated. Cor: Mechanical heart sounds with LVAD hum present. Lungs: clear Abdomen: soft, nontender, nondistended. No hepatosplenomegaly. No bruits or masses. Good bowel sounds. Extremities: no cyanosis, clubbing, rash, edema Neuro: alert & orientedx3, cranial nerves grossly intact. moves all 4 extremities w/o difficulty. Affect pleasant  Telemetry: Sinus tachycardia  Labs: Basic Metabolic Panel:  Recent Labs Lab 09/09/16 1618   09/10/16 0244  09/10/16 1751 09/11/16 0430 09/11/16 1013 09/11/16 1610 09/12/16 0438 09/12/16 1702 09/13/16 0410  NA 137  < > 138  < >  --  136 137 136 133* 135 136  K 3.4*  < > 4.2  < >  --  5.0 4.9 4.5 3.3* 3.1* 2.7*  CL 104  < > 107  < >  --  103 102 99* 95* 92* 91*  CO2 26  --  21*  --   --  23  --   --  28  --  34*  GLUCOSE 137*  < > 171*  < >  --  179* 150* 164* 119* 153* 82  BUN 11  < > 11  < >  --  16 20 20 19  21* 21*  CREATININE 0.94  < > 1.08  < > 1.20 1.31* 1.30* 1.30* 1.22 1.10 1.12  CALCIUM 8.3*  --  8.4*  --   --  8.2*  --   --  8.2*  --  8.3*  MG 1.7  < > 2.2  --  2.0 2.0  --   --  1.8  --  1.8  PHOS  --   --  3.0  --   --  3.8  --   --  2.7  --   --   < > = values in this interval not displayed.  Liver Function Tests:  Recent Labs Lab 09/10/16 0244 09/11/16 0430 09/12/16 0438  AST 114* 151* 136*  ALT 23 55 111*  ALKPHOS 44 52 55  BILITOT 2.2* 1.9* 1.5*  PROT 5.4* 6.0* 6.0*  ALBUMIN 3.8 3.5 3.3*   No results for input(s): LIPASE, AMYLASE in the last 168 hours. No results for input(s): AMMONIA in the last 168 hours.  CBC:  Recent Labs Lab 09/10/16 0244 09/10/16 0955  09/10/16 1751 09/11/16 0430 09/11/16 1013 09/11/16 1610 09/12/16 0438 09/12/16 1702 09/13/16 0410  WBC 16.9* 14.7*  --  15.6* 13.7*  --   --  11.6*  --  11.5*  NEUTROABS 15.1*  --   --   --  11.6*  --   --  10.0*  --  9.6*  HGB 9.5* 9.4*  < > 9.4* 9.0* 8.5* 8.5* 8.4* 8.5* 8.6*  HCT 27.9* 27.3*  < > 27.0* 26.6* 25.0* 25.0* 24.4* 25.0* 25.2*  MCV 89.1 87.8  --  89.4 92.0  --   --  91.0  --  91.3  PLT 109* 95*  --  82* 70*  --   --  62*  --  85*  < > = values in this interval not displayed.  INR:  Recent Labs Lab 09/09/16 1618 09/10/16 0244 09/11/16 0430 09/12/16 0438 09/13/16 0410  INR 1.57 1.06 1.32 1.28 1.24    Other results:  EKG:   Imaging: Dg Chest Port 1 View  Result Date: 09/13/2016 CLINICAL DATA:  Congestive heart failure and coronary artery disease. Left  ventricular assist device. EXAM: PORTABLE CHEST 1 VIEW COMPARISON:  09/12/2016 FINDINGS: Support apparatus remains in appropriate position, including left chest tube. No pneumothorax visualized. Cardiomegaly is stable. Improved aeration of both lungs. Both lungs are clear. IMPRESSION: Stable cardiomegaly.  No active lung disease. Electronically Signed   By: Earle Gell M.D.   On: 09/13/2016 07:49   Dg Chest Port 1 View  Result Date: 09/12/2016 CLINICAL DATA:  PICC line placement EXAM: PORTABLE CHEST 1 VIEW COMPARISON:  Earlier same day; 09/11/2016 FINDINGS: Examination is degraded secondary to exclusion of the bilateral costophrenic angles. Interval placement of right upper extremity approach PICC line with tip projected over the superior cavoatrial junction. Otherwise, stable position of support apparatus. No pneumothorax. Grossly unchanged bibasilar heterogeneous / consolidative opacities, left greater than right. No definite pleural effusion, though note, the bilateral costophrenic angles are excluded from view. Pulmonary is congestion without frank evidence of edema. No pneumothorax. Unchanged bones. IMPRESSION: 1. Right upper extremity approach PICC line tip projects over the superior cavoatrial junction. 2. Otherwise, stable positioning remaining support apparatus. No pneumothorax. Electronically Signed   By: Sandi Mariscal M.D.   On: 09/12/2016 10:33   Dg Chest Port 1 View  Result Date: 09/12/2016 CLINICAL DATA:  LVAD placed 09/09/2016 EXAM: PORTABLE CHEST 1 VIEW COMPARISON:  09/11/2016; 09/10/2016; 09/09/2016; 09/08/2016 FINDINGS: Grossly unchanged cardiac silhouette and mediastinal contours. Post median sternotomy. Stable position of support apparatus including left-sided chest tube. No pneumothorax. Pulmonary venous congestion without frank evidence of edema. Bibasilar opacities are unchanged, left greater than right. No new focal airspace opacities. No definite pleural effusion. Unchanged bones.  IMPRESSION: 1.  Stable positioning of support apparatus.  No pneumothorax. 2. Bibasilar atelectasis without acute cardiopulmonary disease. Electronically Signed   By: Sandi Mariscal M.D.   On: 09/12/2016 09:32     Medications:     Scheduled Medications: . sodium chloride   Intravenous Once  . sodium chloride   Intravenous Once  . acetaminophen  1,000 mg Oral Q6H   Or  . acetaminophen (TYLENOL) oral liquid  160 mg/5 mL  1,000 mg Per Tube Q6H  . aspirin EC  81 mg Oral Daily  . atorvastatin  20 mg Oral QHS  . bisacodyl  10 mg Oral Daily   Or  . bisacodyl  10 mg Rectal Daily  . digoxin  250 mcg Oral Daily  . docusate sodium  200 mg Oral Daily  . escitalopram  20 mg Oral Daily  . furosemide  20 mg Intravenous BID  . insulin aspart  0-24 Units Subcutaneous Q4H  . insulin detemir  24 Units Subcutaneous BID  . mouth rinse  15 mL Mouth Rinse BID  . metoCLOPramide (REGLAN) injection  10 mg Intravenous Q6H  . multivitamin with minerals  1 tablet Oral Daily  . pantoprazole  40 mg Oral Daily  . potassium chloride  40 mEq Oral BID  . spironolactone  25 mg Oral Daily  . warfarin  7.5 mg Oral q1800  . Warfarin - Physician Dosing Inpatient   Does not apply q1800    Infusions: . sodium chloride Stopped (09/11/16 1000)  . sodium chloride    . sodium chloride 10 mL/hr at 09/13/16 1300  . bivalirudin (ANGIOMAX) infusion 0.5 mg/mL (Non-ACS indications) 0.05 mg/kg/hr (09/13/16 1300)  . lactated ringers Stopped (09/12/16 1300)  . lactated ringers 10 mL/hr (09/10/16 2030)  . 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 Postop thrombocytopenia, coagulopathy  Plan/Discussion:   Patient extubated and stable on  medium dose milrinone Co-ox this morning 65%, continue milrinone 0.3 Chest tube output has decreased-wil  remove mediastinal and left pleural tubes and leave pocket drain in place Patient is starting to ambulate in hallway- 300 feet yesterday Blood sugar controlled on twice a day Lantus plus sliding scale Expected postop blood loss anemia, hematocrit stable at 25% Transition from Lasix drip to twice a day IV Lasix dosing and supplement potassium as needed  I reviewed the LVAD parameters from today, and compared the results to the patient's prior recorded data.  No programming changes were made.  The LVAD is functioning within specified parameters.  The patient performs LVAD self-test daily.  LVAD interrogation was negative for any significant power changes, alarms or PI events/speed drops.  LVAD equipment check completed and is in good working order.  Back-up equipment present.   LVAD education done on emergency procedures and precautions and reviewed exit site care.  Length of Stay: [place] III 09/13/2016, 1:55 PM

## 2016-09-13 NOTE — Progress Notes (Signed)
Physical Therapy Treatment Patient Details Name: Ronald Miller. MRN: [id number] DOB: 1956/01/09 Today's Date: 09/13/2016    History of Present Illness Patient is a 60 yo male admitted 08/22/16 with acute on chronic systolic HF, with LVEF at 15%.   PMH:  chronic HF, ICM, CAD, CABG, ICD, HTN, polysubstance abuse, depression, COPD, CKD, DM    PT Comments    Per RN pt ambulated 200' yesterday. Today pt ambulation tolerance limited by feeling of dizziness, orthostatic hypostension, and surgical chest pain requiring seated rest breaks. Acute PT to follow to progress indep with mobility.   Follow Up Recommendations  Home health PT;Supervision for mobility/OOB     Equipment Recommendations  Rolling walker with 5" wheels    Recommendations for Other Services       Precautions / Restrictions Precautions Precautions: Sternal Precaution Comments: LVAD Restrictions Weight Bearing Restrictions: Yes Other Position/Activity Restrictions: Patient with medial mediastinal lines in place.    Mobility  Bed Mobility Overal bed mobility: Needs Assistance Bed Mobility: Supine to Sit     Supine to sit: Mod assist;+2 for physical assistance;+2 for safety/equipment     General bed mobility comments: pt able to bring LEs off EOB, modA x2 for trunk elevation to adhere to sternal precautions  Transfers Overall transfer level: Needs assistance Equipment used: 2 person hand held assist Transfers: Sit to/from [facility] Sit to Stand: Mod assist;+2 physical assistance         General transfer comment: Verbal cues for use of pillow and technique.  Assist to rise to standing.  Fair balance in stance.  Patient able to take several side-steps to move to bed with +2 assist and +1 for lines.  Ambulation/Gait Ambulation/Gait assistance: Min assist;Mod assist;+2 physical assistance;+2 safety/equipment Ambulation Distance (Feet): 125 Feet Assistive device: Rolling walker (2  wheeled) (eva) Gait Pattern/deviations: Step-through pattern;Decreased stride length Gait velocity: slow Gait velocity interpretation: Below normal speed for age/gender General Gait Details: pt with c/o dizziness, noted pt to be pale. Had to sit s/p 15 feet. Pt did have a drop in BP. Pt motivated and pushed through to amb 125' however pt did not feel well and experienced increased chest pain   Stairs            Wheelchair Mobility    Modified Rankin (Stroke Patients Only)       Balance Overall balance assessment: Needs assistance Sitting-balance support: Feet supported;No upper extremity supported Sitting balance-Leahy Scale: Fair     Standing balance support: Bilateral upper extremity supported Standing balance-Leahy Scale: Fair                      Cognition Arousal/Alertness: Awake/alert Behavior During Therapy: WFL for tasks assessed/performed Overall Cognitive Status: Within Functional Limits for tasks assessed                      Exercises      General Comments General comments (skin integrity, edema, etc.): attempted to educate pt on management of drive lines of LVAD however pt very distracted by pain and unable to focus      Pertinent Vitals/Pain Pain Assessment: 0-10 Pain Score: 7  (pt with no pain at rest, 7 with mobility) Pain Location: chest/sternal area Pain Descriptors / Indicators: Grimacing;Discomfort    Home Living                      Prior Function  PT Goals (current goals can now be found in the care plan section) Acute Rehab PT Goals Patient Stated Goal: decrease pain Progress towards PT goals: Progressing toward goals    Frequency    Min 4X/week      PT Plan Current plan remains appropriate    Co-evaluation             End of Session Equipment Utilized During Treatment: Oxygen Activity Tolerance: Patient limited by pain Patient left: in chair;with call bell/phone within reach      Time: 1123-1210 PT Time Calculation (min) (ACUTE ONLY): 47 min  Charges:  $Gait Training: 8-22 mins                    G Codes:      Ronald Miller Ronald Miller Sep 29, 2016, 1:01 PM   Ronald Miller, PT, DPT Pager #: [phone] Office #: [phone]

## 2016-09-13 NOTE — Clinical Social Work Note (Signed)
Patient getting a bath when CSW came by room. Nursing staff have voiced no social work needs. CSW will continue to follow progress.  Dayton Scrape, [place]

## 2016-09-13 NOTE — Progress Notes (Signed)
HeartMate 2 Rounding Note  Subjective:    S/P HMII Post Op Day 4  Continues to improve. Diuresing well. Weight down 14 pounds. Passing gas. Walked 200 feet yesterday. Still with some CP.   Remains on milrinone. MAPs 70s.    CO-OX 56%->64%->68% LVAD INTERROGATION:  HeartMate III LVAD:  Flow 5.1 liters/min, speed 5600, power 4.0, PI 2.8.  Occasional PI  Objective:    Vital Signs:   Temp:  [98.4 F (36.9 C)-98.7 F (37.1 C)] 98.5 F (36.9 C) (12/11 0400) Pulse Rate:  [30-115] 79 (12/11 0700) Resp:  [0-21] 18 (12/11 0700) BP: (74-119)/(39-100) 115/60 (12/11 0700) SpO2:  [93 %-100 %] 93 % (12/11 0700) Arterial Line BP: (82-84)/(68-71) 84/71 (12/10 1000) Weight:  [103.4 kg (227 lb 15.3 oz)] 103.4 kg (227 lb 15.3 oz) (12/11 0500) Last BM Date: 09/08/16 Mean arterial Pressure 70s  Intake/Output:   Intake/Output Summary (Last 24 hours) at 09/13/16 0845 Last data filed at 09/13/16 0700  Gross per 24 hour  Intake           3269.1 ml  Output             6665 ml  Net          -3395.9 ml     Physical Exam: CVP 10 General:  Awake Lying in bed. NAD HEENT: normal Neck: supple. JVP to jaw . +IJ TLC Carotids 2+ bilat; no bruits. No lymphadenopathy or thryomegaly appreciated. RIJ swan  Cor: Mechanical heart sounds with LVAD hum present. + CTs Lungs: clear Abdomen: soft, nontender, + distended. Minimal BS Driveline: C/D/I; securement device intact and driveline incorporated Extremities: no cyanosis, clubbing, rash, 1+ edema Neuro: awake and oriented. nonfocal GU: Foley  Telemetry: V paced 100s   Labs: Basic Metabolic Panel:  Recent Labs Lab 09/09/16 1618  09/10/16 0244  09/10/16 1751 09/11/16 0430 09/11/16 1013 09/11/16 1610 09/12/16 0438 09/12/16 1702 09/13/16 0410  NA 137  < > 138  < >  --  136 137 136 133* 135 136  K 3.4*  < > 4.2  < >  --  5.0 4.9 4.5 3.3* 3.1* 2.7*  CL 104  < > 107  < >  --  103 102 99* 95* 92* 91*  CO2 26  --  21*  --   --  23  --   --  28   --  34*  GLUCOSE 137*  < > 171*  < >  --  179* 150* 164* 119* 153* 82  BUN 11  < > 11  < >  --  16 20 20 19  21* 21*  CREATININE 0.94  < > 1.08  < > 1.20 1.31* 1.30* 1.30* 1.22 1.10 1.12  CALCIUM 8.3*  --  8.4*  --   --  8.2*  --   --  8.2*  --  8.3*  MG 1.7  < > 2.2  --  2.0 2.0  --   --  1.8  --  1.8  PHOS  --   --  3.0  --   --  3.8  --   --  2.7  --   --   < > = values in this interval not displayed.  Liver Function Tests:  Recent Labs Lab 09/10/16 0244 09/11/16 0430 09/12/16 0438  AST 114* 151* 136*  ALT 23 55 111*  ALKPHOS 44 52 55  BILITOT 2.2* 1.9* 1.5*  PROT 5.4* 6.0* 6.0*  ALBUMIN 3.8 3.5 3.3*   No  results for input(s): LIPASE, AMYLASE in the last 168 hours. No results for input(s): AMMONIA in the last 168 hours.  CBC:  Recent Labs Lab 09/10/16 0244 09/10/16 0955  09/10/16 1751 09/11/16 0430 09/11/16 1013 09/11/16 1610 09/12/16 0438 09/12/16 1702 09/13/16 0410  WBC 16.9* 14.7*  --  15.6* 13.7*  --   --  11.6*  --  11.5*  NEUTROABS 15.1*  --   --   --  11.6*  --   --  10.0*  --  9.6*  HGB 9.5* 9.4*  < > 9.4* 9.0* 8.5* 8.5* 8.4* 8.5* 8.6*  HCT 27.9* 27.3*  < > 27.0* 26.6* 25.0* 25.0* 24.4* 25.0* 25.2*  MCV 89.1 87.8  --  89.4 92.0  --   --  91.0  --  91.3  PLT 109* 95*  --  82* 70*  --   --  62*  --  85*  < > = values in this interval not displayed.  INR:  Recent Labs Lab 09/09/16 1618 09/10/16 0244 09/11/16 0430 09/12/16 0438 09/13/16 0410  INR 1.57 1.06 1.32 1.28 1.24    Other results:    Imaging: Dg Chest Port 1 View  Result Date: 09/13/2016 CLINICAL DATA:  Congestive heart failure and coronary artery disease. Left ventricular assist device. EXAM: PORTABLE CHEST 1 VIEW COMPARISON:  09/12/2016 FINDINGS: Support apparatus remains in appropriate position, including left chest tube. No pneumothorax visualized. Cardiomegaly is stable. Improved aeration of both lungs. Both lungs are clear. IMPRESSION: Stable cardiomegaly.  No active lung disease.  Electronically Signed   By: Earle Gell M.D.   On: 09/13/2016 07:49   Dg Chest Port 1 View  Result Date: 09/12/2016 CLINICAL DATA:  PICC line placement EXAM: PORTABLE CHEST 1 VIEW COMPARISON:  Earlier same day; 09/11/2016 FINDINGS: Examination is degraded secondary to exclusion of the bilateral costophrenic angles. Interval placement of right upper extremity approach PICC line with tip projected over the superior cavoatrial junction. Otherwise, stable position of support apparatus. No pneumothorax. Grossly unchanged bibasilar heterogeneous / consolidative opacities, left greater than right. No definite pleural effusion, though note, the bilateral costophrenic angles are excluded from view. Pulmonary is congestion without frank evidence of edema. No pneumothorax. Unchanged bones. IMPRESSION: 1. Right upper extremity approach PICC line tip projects over the superior cavoatrial junction. 2. Otherwise, stable positioning remaining support apparatus. No pneumothorax. Electronically Signed   By: Sandi Mariscal M.D.   On: 09/12/2016 10:33   Dg Chest Port 1 View  Result Date: 09/12/2016 CLINICAL DATA:  LVAD placed 09/09/2016 EXAM: PORTABLE CHEST 1 VIEW COMPARISON:  09/11/2016; 09/10/2016; 09/09/2016; 09/08/2016 FINDINGS: Grossly unchanged cardiac silhouette and mediastinal contours. Post median sternotomy. Stable position of support apparatus including left-sided chest tube. No pneumothorax. Pulmonary venous congestion without frank evidence of edema. Bibasilar opacities are unchanged, left greater than right. No new focal airspace opacities. No definite pleural effusion. Unchanged bones. IMPRESSION: 1.  Stable positioning of support apparatus.  No pneumothorax. 2. Bibasilar atelectasis without acute cardiopulmonary disease. Electronically Signed   By: Sandi Mariscal M.D.   On: 09/12/2016 09:32     Medications:     Scheduled Medications: . sodium chloride   Intravenous Once  . sodium chloride   Intravenous Once   . acetaminophen  1,000 mg Oral Q6H   Or  . acetaminophen (TYLENOL) oral liquid 160 mg/5 mL  1,000 mg Per Tube Q6H  . aspirin EC  81 mg Oral Daily  . atorvastatin  20 mg Oral QHS  .  bisacodyl  10 mg Oral Daily   Or  . bisacodyl  10 mg Rectal Daily  . cefUROXime (ZINACEF)  IV  1.5 g Intravenous Q12H  . digoxin  250 mcg Oral Daily  . docusate sodium  200 mg Oral Daily  . escitalopram  20 mg Oral Daily  . furosemide  20 mg Intravenous BID  . insulin aspart  0-24 Units Subcutaneous Q4H  . insulin detemir  24 Units Subcutaneous BID  . mouth rinse  15 mL Mouth Rinse BID  . metoCLOPramide (REGLAN) injection  10 mg Intravenous Q6H  . multivitamin with minerals  1 tablet Oral Daily  . pantoprazole  40 mg Oral Daily  . potassium chloride  40 mEq Oral BID  . spironolactone  25 mg Oral Daily  . warfarin  7.5 mg Oral q1800  . Warfarin - Physician Dosing Inpatient   Does not apply q1800    Infusions: . sodium chloride Stopped (09/11/16 1000)  . sodium chloride    . sodium chloride 10 mL/hr at 09/12/16 0400  . bivalirudin (ANGIOMAX) infusion 0.5 mg/mL (Non-ACS indications)    . lactated ringers Stopped (09/12/16 1300)  . lactated ringers 10 mL/hr (09/10/16 2030)  . milrinone 0.3 mcg/kg/min (09/13/16 0253)    PRN Medications: sodium chloride, albumin human, albuterol, fentaNYL (SUBLIMAZE) injection, Melatonin, ondansetron (ZOFRAN) IV, oxyCODONE, senna, sodium chloride flush, sodium chloride flush   Assessment/Plan/Discussion    1. 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  Echo 08/24/16 LVEF 15%, RV mild dilated, moderately reduced.  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 Still with volume overload. Continue lasix. - co-ox looks goodwill continue milrinone until fully diuresesd - VAD parameters and renal function stable. LDH ok - mobilize 2. AKI on CKD III: - Renal function stable. Follow closely.  3.  Hypercarbic respiratory failure - Encourage IS. Limit sedation. Diurese.  4. CAD: No CP. 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  5. DMII: Hgb A1C 8.3 on 08/26/2016  - Cover with SSI  6. RLE cellulitis: Completed 8 days of Vancomycin.  7. Tobacco abuse:  8. Hypokalemia:  - Will supp. Continue spiro.   D/w Dr. Prescott Gum at bedside.     I reviewed the LVAD parameters from today, and compared the results to the patient's prior recorded data.  No programming changes were made.  The LVAD is functioning within specified parameters.  The patient performs LVAD self-test daily.  LVAD interrogation was negative for any significant power changes, alarms or PI events/speed drops.  LVAD equipment check completed and is in good working order.  Back-up equipment present.   LVAD education done on emergency procedures and precautions and reviewed exit site care.  Length of Stay: 22  Glori Bickers MD 09/13/2016, 8:45 AM  VAD Team --- VAD ISSUES ONLY--- Pager [phone] (7am - 7am)  Advanced Heart Failure Team  Pager [phone] (M-F; 7a - 4p)  Please contact [facility] for night-coverage after hours (4p -7a ) and weekends on [email or web address]

## 2016-09-13 NOTE — Op Note (Signed)
NAME:  Ronald Miller, Ronald Miller                     ACCOUNT NO.:  MEDICAL RECORD NO.:  [id number]  LOCATION:                                 FACILITY:  PHYSICIAN:  [name], M.D.  DATE OF BIRTH:  Apr 19, 1956  DATE OF PROCEDURE:  09/09/2016 DATE OF DISCHARGE:                              OPERATIVE REPORT   OPERATIONS: 1. Redo sternotomy. 2. Implantation of HeartMate III left ventricular assist device. 3. Placement of left femoral A-line for blood pressure monitoring.  PREOPERATIVE DIAGNOSES: 1. Ischemic cardiomyopathy, advanced, class IV congestive heart     failure, dependent on inotropes. 2. Status post coronary artery bypass graft x6 in 2001.  POSTOPERATIVE DIAGNOSES: 1. Ischemic cardiomyopathy, advanced, class 4 congestive heart     failure, dependent on inotropes. 2. Status post coronary artery bypass graft x6 in 2001.  SURGEON:  [name], M.D.  ASSISTANTS:  John Giovanni, PA-C, Irish Lack, RNFA.  ANESTHESIA:  General by Glynda Jaeger, M.D.  INDICATIONS:  The patient is a 60 year old Caucasian male who has been followed by the Advanced Heart Failure Service for several months and has been on home IV Milrinone for advanced heart failure with ejection fraction of 15% and symptoms of class 4 heart failure.  He has ischemic cardiomyopathy, having previously had CABG x6 in 2001.  Most recently, his cardiac catheterization has showed occluded vein grafts with a patent left IMA to LAD.  His echo showed severe LV dysfunction with EF of 15% and mild-to-moderate RV dysfunction without significant TR, but with significant MR.  The patient had been carefully evaluated by the full VAD team and was felt to be medically and socially acceptable for implantation of a left ventricular assist device.  The patient had been also evaluated by the Heart Transplant Service at [facility] and was felt to be a candidate for potential cardiac transplantation, however, was not listed  at this time because of a positive screen for tobacco and possibly other illicit substance.  The patient understood that he could potentially be a transplant candidate in the future if he accepted substance abuse abstinence.  I felt that the patient was a candidate for the HeartMate III due to its smaller size and the ability to avoid creating a large pocket for pump placement in the subdiaphragmatic space which was obliterated from his previous multivessel CABG and internal mammary artery harvest.  I discussed the benefits as well as the risks of implantable VAD to the patient including the risk of stroke, bleeding requiring transfusion, postoperative infection, postoperative pulmonary problems including pleural effusions, postoperative RV dysfunction requiring right ventricular assist device requirement, and death.  After reviewing these issues, he demonstrated his understanding and agreed to proceed with surgery under what I felt was an informed consent.  FINDINGS: 1. Severe adhesions from multivessel CABG which were tedious and time     consuming to takedown with care being taken to preserve the patent     left IMA to the LAD. 2. Good RV function, separating from cardiopulmonary bypass with     excellent flow parameters of the HeartMate III pump. 3. Post reversal of heparin  with protamine.  The patient had     significant coagulopathy which was treated with multiple blood     products.  DESCRIPTION OF PROCEDURE:  The patient was brought to the operating room, placed supine on the operating room table.  General anesthesia was induced under invasive hemodynamic monitoring.  A transesophageal echo probe was placed by the anesthesiologist.  This confirmed the preoperative diagnosis of severe ischemic cardiomyopathy.  There was no evidence of a PFO.  There was no significant TR.  There was no significant AI.  Femoral A-line was placed in the left femoral artery and a micro  sheath for possible venous cannulation was placed in the right femoral vein.  A sternal incision was made.  The redo sternotomy was performed using the oscillating saw with care being taken to avoid injury to the underlying structures.  The RV was adherent or plastered to the undersurface of the left sternal edge and this was carefully dissected down without major bleeding or injury to the RV.  Anterior mediastinal dissection was used then to dissect and expose the anterior surface of the RV, the aorta, and the pulmonary artery.  The right atrium was exposed.  A sternal elevating retractor was placed on the left side and the mammary artery pedicle coming through the left pericardium was identified and dissected and encircled with a soft vessel loop.  Next, a sternal retractor was placed and pursestrings were placed in the ascending aorta close to the previous cannulation site into the right atrium.  Heparin was administered.  When the ACT was documented as being therapeutic, the patient was cannulated and placed on cardiopulmonary bypass.  On bypass, further dissection was performed to free up the anterior surface of the heart, the lateral surface of the heart, and to help mobilize the LV apex for later cannulation by the VAD inflow cannula.  This dissection was extremely tedious, time consuming, and bloody due to dense adhesions.  Prior to heparinization, a small incision was made to the left of the umbilicus and the exit site for the power cord was made beneath the left costal margin.  Tunnelers were then placed in connecting these 2 areas to the subxiphoid space.  The LV apex was then exposed after packing several moist lap pads under the heart.  The apical dimple was identified.  The spot for the incision and the LV apex was identified and marked with a marking pen.  Using an 11 blade, the LV apex was entered and dilated with a tonsil.  A Foley catheter was then inserted and  the balloon inflated.  The coring knife was then used to make the apical core at the proper angle and orientation towards the mitral valve.  The myocardial core was removed. There were thick trabeculae in the left ventricle, which were then removed.  The core was taken lateral to the LAD, so as to avoid injury to the LAD and potential blood flow to the right ventricle.  Two Ethibond sutures were then placed around the circumference of the apical core defect, numbering 14 total.  Next, the sutures were placed through the sewing ring which was oriented, so that the opening was properly aligned with the opening in the ring.  The sutures were all tied securely.  The pump was then brought to the field.  The patient was placed in steep Trendelenburg position.  CO2 had been used to insufflate into the surgical field since the chest was opened.  The heart was filled with  fluid and the pump was inserted into the apical ring and then locked securely in place.  The pump was oriented so that the outflow cannula ran parallel to the diaphragm.  At this point, the driveline was then passed through the 2 openings in the abdominal wall and exited the left upper quadrant.  Next, the outflow graft was brought to the pump.  The plug on the outflow connection of the pump was removed and again volume was left into the patient as the connection was made quickly between the outflow graft screw and the main pump.  This was secured and the graft was in the proper orientation.  The bend relief guard was then connected to the pump and the pump was placed in the pericardial pocket.  A partial occluding clamp was then placed on the ascending aorta after the outflow graft was trimmed to the appropriate length and orientation under pressure from the cardiopulmonary bypass circuit.  An incision was made in the ascending aorta and the graft was sewn end- to-side using 2-0 running suture lines of 4-0 Prolene with  pledgets at the toe and heel of the anastomosis.  A needle was placed in the graft which was clamped close to the outflow portion of the pump and the partial occluding clamp was removed.  The anastomosis was hemostatic.  The HeartMate III controller was then connected to the power module and the pump was started at the RPMs of 3000 while the outflow graft was clamped.  The patient was placed in Trendelenburg again and volume was left into the patient from the bypass circuit to vent air through the needle in the outflow graft.  The lungs were expanded and ventilator was resumed.  Inhaled nitric oxide and inotropes were started.  We then transitioned from cardiopulmonary bypass to full LVAD support.  At 4000 RPMs, the clamp was removed from the graft.  At 4600 RPMs, flow on the pump was reduced to half.  At 5000 RPMs, blood flow was reduced to quarter flow on the pump.  At 5200 RPMs, we separated from cardiopulmonary bypass with stable hemodynamics.  Good pump flow.  The venous cannula was removed. There was still opening of the aortic valve and the enter interventricular septum with some shift to the right side, so we increased the speed slowly up to 5400 when protamine was administered. Protamine full dose was given without adverse reaction.  The patient remained stable.  The aortic cannula was removed.  The patient still had poor coagulation function and was given platelets, FFP, and cryoprecipitate with improvement in coagulation function.  A considerable period of time used to dry the operative field was significant bleeding from the sternal edges as well as the small pocket that was made along the left hemidiaphragm.  When the bleeding had been adequately controlled, drains were placed in the anterior mediastinum, posterior mediastinum, left pleural space, and brought out through separate incisions.  The superior pericardial fat was closed over the aorta.  The pericardium was  closed over the outflow graft.  The sternum was then closed with interrupted steel wire.  The fascia was closed with interrupted #1 Vicryls on the lower portion over the pump and running #1 Vicryl over the upper portion of the incision.  The subcutaneous and skin layers were closed in running Vicryl.  The small counter incision in the left lower quadrant was closed in layers using Vicryl.  The VAD power cord exit site was closed with a subcutaneous Vicryl and  2 nylon skin sutures on the skin.  The patient remained stable.  At this point, pump flow was 5600 RPMs with good flow, good mean arterial pressure, and minimal but persistent opening of the aortic valve.  Sterile dressings were applied.  The patient was placed to battery power and transported back to ICU in critical, but stable condition.  Total cardiopulmonary bypass time for redo sternotomy, dissection, and the implantation of the VAD was 170 minutes.     [name], M.D.     PV/MEDQ  D:  09/11/2016  T:  09/12/2016  Job:  [id number]

## 2016-09-13 NOTE — Progress Notes (Signed)
CRITICAL VALUE ALERT  Critical value received:  K+ = 2.7  Date of notification:  09/13/16  Time of notification:  4:57 AM   Critical value read back:Yes.    Nurse who received alert:  Cassell Smiles, RN  MD notified (1st page):  n/a r/t KCl replacement protocol  Time of first page:    MD notified (2nd page):  Time of second page:  Responding MD:    Time MD responded:

## 2016-09-13 NOTE — Evaluation (Signed)
Occupational Therapy Evaluation Patient Details Name: Ronald Miller. MRN: [id number] DOB: November 16, 1955 Today's Date: 09/13/2016    History of Present Illness Patient is a 60 yo male admitted 08/22/16 with acute on chronic systolic HF, with LVEF at 15%.   PMH:  chronic HF, ICM, CAD, CABG, ICD, HTN, polysubstance abuse, depression, COPD, CKD, DM   Clinical Impression   Pt admitted with above. He demonstrates the below listed deficits and will benefit from continued OT to maximize safety and independence with BADLs.  Pt presents to OT with generalized weakness, pain, decreased activity tolerance.  He requires max A for ADLs and min A - mod A+2 for functional mobility.  Activity limited by dizziness, and incisional pain Pump speed 5.1, PI 3.1 during activity.        Follow Up Recommendations  Home health OT;Supervision/Assistance - 24 hour    Equipment Recommendations  Tub/shower seat    Recommendations for Other Services       Precautions / Restrictions Precautions Precautions: Sternal Precaution Comments: LVAD Restrictions Weight Bearing Restrictions: No Other Position/Activity Restrictions: Patient with medial mediastinal lines in place.      Mobility Bed Mobility Overal bed mobility: Needs Assistance Bed Mobility: Supine to Sit     Supine to sit: Mod assist;+2 for physical assistance;+2 for safety/equipment     General bed mobility comments: pt able to bring LEs off EOB, modA x2 for trunk elevation to adhere to sternal precautions  Transfers Overall transfer level: Needs assistance Equipment used: 2 person hand held assist Transfers: Sit to/from [facility] Sit to Stand: Mod assist;+2 physical assistance         General transfer comment: Verbal cues for use of pillow and technique.  Assist to rise to standing.  Fair balance in stance.  Patient able to take several side-steps to move to bed with +2 assist and +1 for lines.    Balance  Overall balance assessment: Needs assistance Sitting-balance support: Feet supported;No upper extremity supported Sitting balance-Leahy Scale: Fair     Standing balance support: Bilateral upper extremity supported Standing balance-Leahy Scale: Fair                              ADL Overall ADL's : Needs assistance/impaired Eating/Feeding: Independent   Grooming: Wash/dry hands;Wash/dry face;Oral care;Brushing hair;Set up;Supervision/safety;Sitting   Upper Body Bathing: Minimal assistance;Sitting   Lower Body Bathing: Maximal assistance;Sit to/from stand   Upper Body Dressing : Maximal assistance;Sitting   Lower Body Dressing: Total assistance;Sit to/from stand   Toilet Transfer: Minimal assistance;+2 for physical assistance;Ambulation;BSC;RW;Moderate assistance   Toileting- Clothing Manipulation and Hygiene: Maximal assistance;Sit to/from stand       Functional mobility during ADLs: Minimal assistance;+2 for physical assistance;+2 for safety/equipment;Rolling walker;Moderate assistance General ADL Comments: Pt with c/o dizziness during eval that limited him.  Pain 9/10 sternal incision      Vision     Perception     Praxis      Pertinent Vitals/Pain Pain Assessment: 0-10 Pain Score: 9  Pain Location: chest/sternal area Pain Descriptors / Indicators: Grimacing;Guarding;Sharp;Tender Pain Intervention(s): Monitored during session;Repositioned;Patient requesting pain meds-RN notified;RN gave pain meds during session     Hand Dominance     Extremity/Trunk Assessment Upper Extremity Assessment Upper Extremity Assessment: Generalized weakness   Lower Extremity Assessment Lower Extremity Assessment: Defer to PT evaluation       Communication Communication Communication: No difficulties   Cognition Arousal/Alertness: Awake/alert Behavior  During Therapy: WFL for tasks assessed/performed Overall Cognitive Status: Within Functional Limits for tasks  assessed                     General Comments       Exercises       Shoulder Instructions      Home Living Family/patient expects to be discharged to:: Private residence Living Arrangements: Alone Available Help at Discharge: Family;Available 24 hours/day (sister and mother ) Type of Home: House Home Access: Stairs to enter [employer] of Steps: 2 Entrance Stairs-Rails: Right Home Layout: One level     Bathroom Shower/Tub: Teacher, early years/pre: Standard     Home Equipment: None          Prior Functioning/Environment Level of Independence: Independent        Comments: Pt worked as a Arts administrator until ~ 5 years ago        OT Problem List: Decreased strength;Decreased activity tolerance;Impaired balance (sitting and/or standing);Decreased safety awareness;Decreased knowledge of use of DME or AE;Decreased knowledge of precautions;Cardiopulmonary status limiting activity;Pain   OT Treatment/Interventions: Self-care/ADL training;Energy conservation;DME and/or AE instruction;Therapeutic activities;Patient/family education;Balance training    OT Goals(Current goals can be found in the care plan section) Acute Rehab OT Goals Patient Stated Goal: to regain independence  OT Goal Formulation: With patient Time For Goal Achievement: 09/27/16 Potential to Achieve Goals: Good ADL Goals Pt Will Perform Grooming: with supervision;standing Pt Will Perform Upper Body Bathing: with set-up;with supervision;sitting Pt Will Perform Lower Body Bathing: with supervision;sit to/from stand Pt Will Perform Upper Body Dressing: with set-up;with supervision;sitting Pt Will Perform Lower Body Dressing: with supervision;sit to/from stand Pt Will Transfer to Toilet: with supervision;ambulating;regular height toilet;bedside commode;grab bars Pt Will Perform Toileting - Clothing Manipulation and hygiene: with supervision;sit to/from stand Pt Will  Perform Tub/Shower Transfer: with supervision;ambulating;shower seat;rolling walker Additional ADL Goal #1: Pt will independently manage VAD equipment during ADLs   OT Frequency: Min 3X/week   Barriers to D/C:            Co-evaluation PT/OT/SLP Co-Evaluation/Treatment: Yes Reason for Co-Treatment: To address functional/ADL transfers;Complexity of the patient's impairments (multi-system involvement);For patient/therapist safety   OT goals addressed during session: ADL's and self-care;Strengthening/ROM      End of Session Equipment Utilized During Treatment: Gait belt;Rolling walker;Oxygen Nurse Communication: Mobility status;Patient requests pain meds  Activity Tolerance: Patient limited by pain Patient left: in chair;with call bell/phone within reach;with nursing/sitter in room   Time: 1128-1213 OT Time Calculation (min): 45 min Charges:  OT General Charges $OT Visit: 1 Procedure OT Evaluation $OT Eval Moderate Complexity: 1 Procedure OT Treatments $Therapeutic Activity: 8-22 mins G-Codes:    Ambrea Hegler M 09-21-16, 3:45 PM

## 2016-09-14 ENCOUNTER — Inpatient Hospital Stay (HOSPITAL_COMMUNITY): Payer: Medicare HMO

## 2016-09-14 DIAGNOSIS — Z95811 Presence of heart assist device: Secondary | ICD-10-CM

## 2016-09-14 DIAGNOSIS — I5022 Chronic systolic (congestive) heart failure: Secondary | ICD-10-CM

## 2016-09-14 LAB — BASIC METABOLIC PANEL
ANION GAP: 9 (ref 5–15)
BUN: 19 mg/dL (ref 6–20)
CALCIUM: 8.3 mg/dL — AB (ref 8.9–10.3)
CO2: 35 mmol/L — ABNORMAL HIGH (ref 22–32)
Chloride: 90 mmol/L — ABNORMAL LOW (ref 101–111)
Creatinine, Ser: 0.84 mg/dL (ref 0.61–1.24)
GFR calc Af Amer: >60 mL/min (ref >60)
GLUCOSE: 138 mg/dL — AB (ref 65–99)
Potassium: 3.4 mmol/L — ABNORMAL LOW (ref 3.5–5.1)
SODIUM: 134 mmol/L — AB (ref 135–145)

## 2016-09-14 LAB — CBC WITH DIFFERENTIAL/PLATELET
BASOS ABS: 0 10*3/uL (ref 0.0–0.1)
BASOS PCT: 0 %
EOS PCT: 0 %
Eosinophils Absolute: 0 10*3/uL (ref 0.0–0.7)
HCT: 28.2 % — ABNORMAL LOW (ref 39.0–52.0)
Hemoglobin: 9.4 g/dL — ABNORMAL LOW (ref 13.0–17.0)
LYMPHS PCT: 6 %
Lymphs Abs: 0.7 10*3/uL (ref 0.7–4.0)
MCH: 30.8 pg (ref 26.0–34.0)
MCHC: 33.3 g/dL (ref 30.0–36.0)
MCV: 92.5 fL (ref 78.0–100.0)
MONO ABS: 1.1 10*3/uL — AB (ref 0.1–1.0)
Monocytes Relative: 9 %
Neutro Abs: 10 10*3/uL — ABNORMAL HIGH (ref 1.7–7.7)
Neutrophils Relative %: 85 %
PLATELETS: 105 10*3/uL — AB (ref 150–400)
RBC: 3.05 MIL/uL — AB (ref 4.22–5.81)
RDW: 13.9 % (ref 11.5–15.5)
WBC: 11.4 10*3/uL — AB (ref 4.0–10.5)

## 2016-09-14 LAB — POCT I-STAT, CHEM 8
BUN: 21 mg/dL — AB (ref 6–20)
CALCIUM ION: 1.12 mmol/L — AB (ref 1.15–1.40)
CHLORIDE: 87 mmol/L — AB (ref 101–111)
CREATININE: 0.9 mg/dL (ref 0.61–1.24)
GLUCOSE: 190 mg/dL — AB (ref 65–99)
HCT: 26 % — ABNORMAL LOW (ref 39.0–52.0)
Hemoglobin: 8.8 g/dL — ABNORMAL LOW (ref 13.0–17.0)
Potassium: 3.7 mmol/L (ref 3.5–5.1)
Sodium: 134 mmol/L — ABNORMAL LOW (ref 135–145)
TCO2: 33 mmol/L (ref 0–100)

## 2016-09-14 LAB — GLUCOSE, CAPILLARY
GLUCOSE-CAPILLARY: 100 mg/dL — AB (ref 65–99)
GLUCOSE-CAPILLARY: 189 mg/dL — AB (ref 65–99)
GLUCOSE-CAPILLARY: 189 mg/dL — AB (ref 65–99)
GLUCOSE-CAPILLARY: 55 mg/dL — AB (ref 65–99)
GLUCOSE-CAPILLARY: 86 mg/dL (ref 65–99)
Glucose-Capillary: 34 mg/dL — CL (ref 65–99)
Glucose-Capillary: 264 mg/dL — ABNORMAL HIGH (ref 65–99)
Glucose-Capillary: 297 mg/dL — ABNORMAL HIGH (ref 65–99)
Glucose-Capillary: 99 mg/dL (ref 65–99)

## 2016-09-14 LAB — MAGNESIUM: MAGNESIUM: 1.8 mg/dL (ref 1.7–2.4)

## 2016-09-14 LAB — APTT: aPTT: 70 seconds — ABNORMAL HIGH (ref 24–36)

## 2016-09-14 LAB — COOXEMETRY PANEL
Carboxyhemoglobin: 1.6 % — ABNORMAL HIGH (ref 0.5–1.5)
Methemoglobin: 0.8 % (ref 0.0–1.5)
O2 Saturation: 53 %
Total hemoglobin: 8.9 g/dL — ABNORMAL LOW (ref 12.0–16.0)

## 2016-09-14 LAB — PROTIME-INR
INR: 1.54
PROTHROMBIN TIME: 18.7 s — AB (ref 11.4–15.2)

## 2016-09-14 LAB — LACTATE DEHYDROGENASE: LDH: 300 U/L — AB (ref 98–192)

## 2016-09-14 MED ORDER — INSULIN DETEMIR 100 UNIT/ML ~~LOC~~ SOLN
10.0000 [IU] | Freq: Two times a day (BID) | SUBCUTANEOUS | Status: DC
  Administered 2016-09-14 – 2016-09-25 (×22): 10 [IU] via SUBCUTANEOUS
  Filled 2016-09-14 (×24): qty 0.1

## 2016-09-14 MED ORDER — SODIUM CHLORIDE 0.9 % IV SOLN
30.0000 meq | Freq: Once | INTRAVENOUS | Status: AC
  Administered 2016-09-14: 30 meq via INTRAVENOUS
  Filled 2016-09-14: qty 15

## 2016-09-14 MED ORDER — AMIODARONE IV BOLUS ONLY 150 MG/100ML
150.0000 mg | Freq: Once | INTRAVENOUS | Status: AC
  Administered 2016-09-14: 150 mg via INTRAVENOUS
  Filled 2016-09-14: qty 100

## 2016-09-14 MED ORDER — FENTANYL CITRATE (PF) 100 MCG/2ML IJ SOLN
50.0000 ug | INTRAMUSCULAR | Status: DC | PRN
  Administered 2016-09-14 (×3): 50 ug via INTRAVENOUS
  Filled 2016-09-14 (×3): qty 2

## 2016-09-14 MED ORDER — GLUCERNA SHAKE PO LIQD
237.0000 mL | Freq: Two times a day (BID) | ORAL | Status: DC
  Administered 2016-09-15 – 2016-09-24 (×9): 237 mL via ORAL

## 2016-09-14 MED ORDER — MAGNESIUM SULFATE 2 GM/50ML IV SOLN
2.0000 g | Freq: Once | INTRAVENOUS | Status: AC
  Administered 2016-09-14: 2 g via INTRAVENOUS
  Filled 2016-09-14: qty 50

## 2016-09-14 MED ORDER — AMIODARONE LOAD VIA INFUSION
150.0000 mg | Freq: Once | INTRAVENOUS | Status: DC
  Filled 2016-09-14: qty 83.34

## 2016-09-14 MED ORDER — AMIODARONE HCL 200 MG PO TABS
400.0000 mg | ORAL_TABLET | Freq: Two times a day (BID) | ORAL | Status: DC
  Administered 2016-09-14 – 2016-09-21 (×16): 400 mg via ORAL
  Filled 2016-09-14 (×16): qty 2

## 2016-09-14 MED ORDER — BISACODYL 10 MG RE SUPP
10.0000 mg | Freq: Once | RECTAL | Status: AC
  Administered 2016-09-14: 10 mg via RECTAL
  Filled 2016-09-14: qty 1

## 2016-09-14 MED ORDER — ASPIRIN EC 325 MG PO TBEC
325.0000 mg | DELAYED_RELEASE_TABLET | Freq: Every day | ORAL | Status: DC
  Administered 2016-09-14 – 2016-09-19 (×6): 325 mg via ORAL
  Filled 2016-09-14 (×6): qty 1

## 2016-09-14 MED ORDER — DM-GUAIFENESIN ER 30-600 MG PO TB12
1.0000 | ORAL_TABLET | Freq: Two times a day (BID) | ORAL | Status: DC
  Administered 2016-09-14 – 2016-09-25 (×23): 1 via ORAL
  Filled 2016-09-14 (×23): qty 1

## 2016-09-14 MED ORDER — SORBITOL 70 % PO SOLN
60.0000 mL | Freq: Once | ORAL | Status: AC
  Administered 2016-09-14: 60 mL via ORAL
  Filled 2016-09-14: qty 60

## 2016-09-14 NOTE — Progress Notes (Signed)
Hypoglycemic Event  CBG: 55 (capillary)  Treatment: 15gm snack  Symptoms: sweaty  Follow-up CBG: Time: [id number] CBG Result: 34 (central line)  Possible Reasons for Event: Lantus at bedtime, not enough PO intake   Comments/MD notified: treated per hypoglycemia protocol    Bing Quarry

## 2016-09-14 NOTE — Progress Notes (Signed)
Physical Therapy Treatment Patient Details Name: Ronald Miller. MRN: [id number] DOB: 09-20-1956 Today's Date: 09/14/2016    History of Present Illness Patient is a 60 yo male admitted 08/22/16 with acute on chronic systolic HF, with LVEF at 15%.   PMH:  chronic HF, ICM, CAD, CABG, ICD, HTN, polysubstance abuse, depression, COPD, CKD, DM    PT Comments    Pt easily agitated today but was able to increase ambulation to 300' with EVA walker. Pt c/o surgical site pain and coughing. Pt easily frustrated and is having a hard time learning on how to manage LVAD lines. Acute PT to con't to follow.  Follow Up Recommendations  Home health PT;Supervision for mobility/OOB     Equipment Recommendations  Rolling walker with 5" wheels    Recommendations for Other Services       Precautions / Restrictions Precautions Precautions: Sternal Precaution Comments: LVAD Restrictions Other Position/Activity Restrictions: Patient with medial mediastinal lines in place.    Mobility  Bed Mobility               General bed mobility comments: pt up in chair upon PT arrival  Transfers Overall transfer level: Needs assistance Equipment used: 1 person hand held assist Transfers: Sit to/from Stand Sit to Stand: Min assist         General transfer comment: Verbal cues for use of pillow and technique.  Assist to rise to standing.  Fair balance in stance.  Patient able to take several side-steps to move to bed with +2 assist and +1 for lines.  Ambulation/Gait Ambulation/Gait assistance: Min assist;Mod assist;+2 physical assistance;+2 safety/equipment Ambulation Distance (Feet): 300 Feet Assistive device: Rolling walker (2 wheeled) (eva walker) Gait Pattern/deviations: Step-through pattern;Decreased stride length Gait velocity: slow Gait velocity interpretation: Below normal speed for age/gender General Gait Details: pt with freq standing rest breaks, max v/c's to not bear down on elbow on  eva walker to minimize pressure on chest/sternal incision.    Stairs            Wheelchair Mobility    Modified Rankin (Stroke Patients Only)       Balance Overall balance assessment: Needs assistance Sitting-balance support: Feet supported;No upper extremity supported Sitting balance-Leahy Scale: Fair     Standing balance support: Bilateral upper extremity supported Standing balance-Leahy Scale: Fair                      Cognition Arousal/Alertness: Awake/alert Behavior During Therapy: Anxious Overall Cognitive Status: Within Functional Limits for tasks assessed                      Exercises      General Comments General comments (skin integrity, edema, etc.): attempted to work on LVAD line mangement however pt easily frustrated and couldn't maintain balance and use both hands to manage battery without onset of increased pain      Pertinent Vitals/Pain Pain Assessment: 0-10 Pain Score: 8  Pain Location: chest/sternal area Pain Descriptors / Indicators: Grimacing;Guarding;Sharp;Tender Pain Intervention(s): Monitored during session    Home Living                      Prior Function            PT Goals (current goals can now be found in the care plan section) Acute Rehab PT Goals Patient Stated Goal: get out of here Progress towards PT goals: Progressing toward goals    Frequency  Min 4X/week      PT Plan Current plan remains appropriate    Co-evaluation             End of Session Equipment Utilized During Treatment: Oxygen Activity Tolerance: Patient limited by pain Patient left: with call bell/phone within reach (on [facility] with OT)     Time: [id number] PT Time Calculation (min) (ACUTE ONLY): 34 min  Charges:  $Gait Training: 8-22 mins $Therapeutic Activity: 8-22 mins                    G Codes:      Kawena Lyday M Vignesh Willert 13-Oct-2016, 1:47 PM   [name], PT, DPT Pager #: [phone] Office #:  [phone]

## 2016-09-14 NOTE — Progress Notes (Signed)
HeartMate 2 Rounding Note  Subjective:    S/P HMII Post Op Day 5  Improving.  Still feels sore.  No BM.  Scheduled for Sorbitol today. to improve. Out 1.2 L,. Weight shows up 2 lbs.    Remains on milrinone. MAPs 70s.   CO-OX 56% -> 64%-> 68% -> 53% LVAD INTERROGATION:  HeartMate III LVAD:  Flow 4.9 liters/min, speed 5600, power 4.2, PI 3.1.  3 PI events.   Objective:    Vital Signs:   Temp:  [95.9 F (35.5 C)-99 F (37.2 C)] 98.1 F (36.7 C) (12/12 0731) Pulse Rate:  [33-125] 71 (12/12 1000) Resp:  [0-24] 16 (12/12 1000) BP: (70-113)/(42-102) 96/81 (12/12 1000) SpO2:  [72 %-99 %] 96 % (12/12 0600) Weight:  [229 lb 8 oz (104.1 kg)] 229 lb 8 oz (104.1 kg) (12/12 0645) Last BM Date: 09/08/16 Mean arterial Pressure 76  Intake/Output:   Intake/Output Summary (Last 24 hours) at 09/14/16 1030 Last data filed at 09/14/16 1000  Gross per 24 hour  Intake           2038.9 ml  Output             3000 ml  Net           -961.1 ml     Physical Exam: CVP 9-10 General:  Sitting in chair. NAD.  HEENT: normal Neck: supple. JVP remains elevated. +IJ TLC Carotids 2+ bilat; no bruits. No thyromegaly or nodule noted. RIJ swan  Cor: Mechanical heart sounds with LVAD hum present. + CTs Lungs: CTAB, normal effort.  Abdomen: soft, NT, mildly distended, no HSM. No bruits or masses. +BS  Driveline: C/D/I; securement device intact and driveline incorporated Extremities: no cyanosis, clubbing, rash, Trace to 1+ edema Neuro: Awake and oriented. Moves all 4 extremities on command and without difficulty.  GU: Foley  Telemetry: Reviewed, V-paced 70s  Labs: Basic Metabolic Panel:  Recent Labs Lab 09/10/16 0244  09/10/16 1751 09/11/16 0430  09/12/16 0438 09/12/16 1702 09/13/16 0410 09/13/16 1605 09/14/16 0515  NA 138  < >  --  136  < > 133* 135 136 134* 134*  K 4.2  < >  --  5.0  < > 3.3* 3.1* 2.7* 3.3* 3.4*  CL 107  < >  --  103  < > 95* 92* 91* 86* 90*  CO2 21*  --   --  23  --   28  --  34*  --  35*  GLUCOSE 171*  < >  --  179*  < > 119* 153* 82 175* 138*  BUN 11  < >  --  16  < > 19 21* 21* 23* 19  CREATININE 1.08  < > 1.20 1.31*  < > 1.22 1.10 1.12 1.10 0.84  CALCIUM 8.4*  --   --  8.2*  --  8.2*  --  8.3*  --  8.3*  MG 2.2  --  2.0 2.0  --  1.8  --  1.8  --  1.8  PHOS 3.0  --   --  3.8  --  2.7  --   --   --   --   < > = values in this interval not displayed.  Liver Function Tests:  Recent Labs Lab 09/10/16 0244 09/11/16 0430 09/12/16 0438  AST 114* 151* 136*  ALT 23 55 111*  ALKPHOS 44 52 55  BILITOT 2.2* 1.9* 1.5*  PROT 5.4* 6.0* 6.0*  ALBUMIN 3.8 3.5 3.3*  No results for input(s): LIPASE, AMYLASE in the last 168 hours. No results for input(s): AMMONIA in the last 168 hours.  CBC:  Recent Labs Lab 09/10/16 0244  09/10/16 1751 09/11/16 0430  09/12/16 0438 09/12/16 1702 09/13/16 0410 09/13/16 1605 09/14/16 0515  WBC 16.9*  < > 15.6* 13.7*  --  11.6*  --  11.5*  --  11.4*  NEUTROABS 15.1*  --   --  11.6*  --  10.0*  --  9.6*  --  10.0*  HGB 9.5*  < > 9.4* 9.0*  < > 8.4* 8.5* 8.6* 8.8* 9.4*  HCT 27.9*  < > 27.0* 26.6*  < > 24.4* 25.0* 25.2* 26.0* 28.2*  MCV 89.1  < > 89.4 92.0  --  91.0  --  91.3  --  92.5  PLT 109*  < > 82* 70*  --  62*  --  85*  --  105*  < > = values in this interval not displayed.  INR:  Recent Labs Lab 09/10/16 0244 09/11/16 0430 09/12/16 0438 09/13/16 0410 09/14/16 0515  INR 1.06 1.32 1.28 1.24 1.54    Other results:    Imaging: Dg Chest Port 1 View  Result Date: 09/14/2016 CLINICAL DATA:  Left ventricular assist device placement EXAM: PORTABLE CHEST 1 VIEW COMPARISON:  September 13, 2016 FINDINGS: More superiorly located left chest tube has been removed. Second chest tube more inferiorly position on the left remains without change. Right jugular catheter has been removed. Right subclavian catheter tip is at the cavoatrial junction. Pacemaker leads are attached to the right atrium, right ventricle, and  left ventricle. There is a left ventricular assist device present. No pneumothorax. There is patchy consolidation in the left lower lobe. Lungs elsewhere are clear. Cardiomegaly is stable with pulmonary vascularity within normal limits. There is atherosclerotic calcification in the aorta. No adenopathy. IMPRESSION: Tube and catheter positions as described without pneumothorax. There is patchy consolidation in the left lower lobe. Lungs elsewhere clear. Stable cardiomegaly. There is aortic atherosclerosis. Electronically Signed   By: Lowella Grip III M.D.   On: 09/14/2016 08:29   Dg Chest Port 1 View  Result Date: 09/13/2016 CLINICAL DATA:  Congestive heart failure and coronary artery disease. Left ventricular assist device. EXAM: PORTABLE CHEST 1 VIEW COMPARISON:  09/12/2016 FINDINGS: Support apparatus remains in appropriate position, including left chest tube. No pneumothorax visualized. Cardiomegaly is stable. Improved aeration of both lungs. Both lungs are clear. IMPRESSION: Stable cardiomegaly.  No active lung disease. Electronically Signed   By: Earle Gell M.D.   On: 09/13/2016 07:49     Medications:     Scheduled Medications: . sodium chloride   Intravenous Once  . sodium chloride   Intravenous Once  . acetaminophen  1,000 mg Oral Q6H   Or  . acetaminophen (TYLENOL) oral liquid 160 mg/5 mL  1,000 mg Per Tube Q6H  . amiodarone  400 mg Oral BID  . aspirin EC  325 mg Oral Daily  . atorvastatin  20 mg Oral QHS  . bisacodyl  10 mg Oral Daily  . bisacodyl  10 mg Rectal Once  . dextromethorphan-guaiFENesin  1 tablet Oral BID  . digoxin  250 mcg Oral Daily  . docusate sodium  200 mg Oral Daily  . escitalopram  20 mg Oral Daily  . furosemide  20 mg Intravenous BID  . insulin aspart  0-24 Units Subcutaneous Q4H  . insulin detemir  10 Units Subcutaneous BID  . magnesium sulfate 1 -  4 g bolus IVPB  2 g Intravenous Once  . mouth rinse  15 mL Mouth Rinse BID  . metoCLOPramide (REGLAN)  injection  10 mg Intravenous Q6H  . multivitamin with minerals  1 tablet Oral Daily  . pantoprazole  40 mg Oral Daily  . potassium chloride (KCL MULTIRUN) 30 mEq in 265 mL IVPB  30 mEq Intravenous Once  . potassium chloride  40 mEq Oral BID  . sorbitol  60 mL Oral Once  . spironolactone  25 mg Oral Daily  . Warfarin - Physician Dosing Inpatient   Does not apply q1800    Infusions: . sodium chloride Stopped (09/11/16 1000)  . sodium chloride    . sodium chloride 10 mL/hr at 09/14/16 1000  . bivalirudin (ANGIOMAX) infusion 0.5 mg/mL (Non-ACS indications) 0.04 mg/kg/hr (09/14/16 1000)  . lactated ringers Stopped (09/12/16 1300)  . lactated ringers 10 mL/hr (09/10/16 2030)  . milrinone 0.3 mcg/kg/min (09/14/16 1000)    PRN Medications: sodium chloride, albumin human, albuterol, fentaNYL (SUBLIMAZE) injection, Melatonin, ondansetron (ZOFRAN) IV, oxyCODONE, senna, sodium chloride flush, sodium chloride flush   Assessment/Plan/Discussion    1. 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  Echo 08/24/16 LVEF 15%, RV mild dilated, moderately reduced.  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  Continue IV lasix.  - Co-ox marginal today at 53%.  Continue milrinone 0.3 mcg/kg/min for now. Hope to wean once diuresed.   - VAD parameters and renal function stable. LDH ok - Continue to mobilize as tolerated.  2. AKI on CKD III: - Stable. Continue to follow with diuresis.   3. Hypercarbic respiratory failure - Encourage IS. Limit sedation. Diurese.  4. CAD: No CP. 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  5. DMII: Hgb A1C 8.3 on 08/26/2016  - Cover with SSI  6. RLE cellulitis: Completed 8 days of Vancomycin.  7. Tobacco abuse 8. Hypokalemia:  - Got extra 30 meq this am. Continue to supp as needed. Continue chronic supp.  - Continue spiro.  9. Atrial fibrillation -  start amio gtt  I reviewed the LVAD parameters from today, and compared the results to the patient's prior recorded data.  No programming changes were made.  The LVAD is functioning within specified parameters.  The patient performs LVAD self-test daily.  LVAD interrogation was negative for any significant power changes, alarms or PI events/speed drops.  LVAD equipment check completed and is in good working order.  Back-up equipment present.   LVAD education done on emergency procedures and precautions and reviewed exit site care.  Length of Stay: [address]  Annamaria Helling 09/14/2016, 10:30 AM  VAD Team --- VAD ISSUES ONLY--- Pager [phone] (7am - 7am)  Advanced Heart Failure Team  Pager [phone] (M-F; 7a - 4p)  Please contact [facility] for night-coverage after hours (4p -7a ) and weekends on [email or web address]  Patient seen and examined with Oda Kilts, PA-C. We discussed all aspects of the encounter. I agree with the assessment and plan as stated above.   Improving with diuresis but still volume overloaded. Co-ox low today at 53%. Continue milrinone. May need to increase. Continue IV lasix. Will start amio for AF. Continue to mobilize. Sorbitol for constipation. On warfarin. LDH 300.   VAD parameters ok.   Irvan Tiedt,MD 4:06 PM

## 2016-09-14 NOTE — Progress Notes (Signed)
HeartMate 3  Rounding Note Postop day #5 implantation of HeartMate 3  [09-09-16] Subjective:    History of ischemic cardiomyopathy, EF 15% status post CABG 6 2001 History of COPD, substance abuse, AICD placement and moderate RV dysfunction Postoperative coagulopathy and acute blood loss anemia requiring transfusion and blood product administration Patient extubated postop day 1 with stable hemodynamics Excellent diuresis on lasix drip , now stopped Coumadin dosing Started however INR remains subtherapeutic-IV bivalirudin started because of low postop platelet count and to avoid heparin exposure  Patient complains of more pain today Patient has abdominal distention, passing flatus but no bowel movement Patient has developed atrial fibrillation with underlying V pacing from permanent pacemaker Co-ox is stable CVP stable  LVAD INTERROGATION:  HeartMate II LVAD:  Flow 5.2 liters/min, speed 5600 RPM, power 4.1, PI 2.2.  Controller intact and connected  Objective:    Vital Signs:   Temp:  [95.9 F (35.5 C)-99 F (37.2 C)] 97.8 F (36.6 C) (12/12 1133) Pulse Rate:  [33-125] 108 (12/12 1100) Resp:  [0-24] 16 (12/12 1000) BP: (70-113)/(42-102) 107/87 (12/12 1100) SpO2:  [70 %-99 %] 70 % (12/12 1100) Weight:  [229 lb 8 oz (104.1 kg)] 229 lb 8 oz (104.1 kg) (12/12 0645) Last BM Date: 09/08/16 Mean arterial Pressure 75 mmHg  Intake/Output:   Intake/Output Summary (Last 24 hours) at 09/14/16 1238 Last data filed at 09/14/16 1100  Gross per 24 hour  Intake           2006.8 ml  Output             3250 ml  Net          -1243.2 ml     Physical Exam: General:  Well appearing. No resp difficulty HEENT: normal Neck: supple. No  JVP .Marland Kitchen No lymphadenopathy or thryomegaly appreciated. Cor: Mechanical heart sounds with LVAD hum present. Lungs: clear Abdomen: soft, nontender,  Mildly distended. No hepatosplenomegaly. No bruits or masses. Good bowel sounds. Extremities: no cyanosis, clubbing,  rash, edema Neuro: alert & orientedx3, cranial nerves grossly intact. moves all 4 extremities w/o difficulty. Affect pleasant  Telemetry: Atrial fibrillation  Labs: Basic Metabolic Panel:  Recent Labs Lab 09/10/16 0244  09/10/16 1751 09/11/16 0430  09/12/16 0438 09/12/16 1702 09/13/16 0410 09/13/16 1605 09/14/16 0515  NA 138  < >  --  136  < > 133* 135 136 134* 134*  K 4.2  < >  --  5.0  < > 3.3* 3.1* 2.7* 3.3* 3.4*  CL 107  < >  --  103  < > 95* 92* 91* 86* 90*  CO2 21*  --   --  23  --  28  --  34*  --  35*  GLUCOSE 171*  < >  --  179*  < > 119* 153* 82 175* 138*  BUN 11  < >  --  16  < > 19 21* 21* 23* 19  CREATININE 1.08  < > 1.20 1.31*  < > 1.22 1.10 1.12 1.10 0.84  CALCIUM 8.4*  --   --  8.2*  --  8.2*  --  8.3*  --  8.3*  MG 2.2  --  2.0 2.0  --  1.8  --  1.8  --  1.8  PHOS 3.0  --   --  3.8  --  2.7  --   --   --   --   < > = values in this interval not displayed.  Liver Function Tests:  Recent Labs Lab 09/10/16 0244 09/11/16 0430 09/12/16 0438  AST 114* 151* 136*  ALT 23 55 111*  ALKPHOS 44 52 55  BILITOT 2.2* 1.9* 1.5*  PROT 5.4* 6.0* 6.0*  ALBUMIN 3.8 3.5 3.3*   No results for input(s): LIPASE, AMYLASE in the last 168 hours. No results for input(s): AMMONIA in the last 168 hours.  CBC:  Recent Labs Lab 09/10/16 0244  09/10/16 1751 09/11/16 0430  09/12/16 0438 09/12/16 1702 09/13/16 0410 09/13/16 1605 09/14/16 0515  WBC 16.9*  < > 15.6* 13.7*  --  11.6*  --  11.5*  --  11.4*  NEUTROABS 15.1*  --   --  11.6*  --  10.0*  --  9.6*  --  10.0*  HGB 9.5*  < > 9.4* 9.0*  < > 8.4* 8.5* 8.6* 8.8* 9.4*  HCT 27.9*  < > 27.0* 26.6*  < > 24.4* 25.0* 25.2* 26.0* 28.2*  MCV 89.1  < > 89.4 92.0  --  91.0  --  91.3  --  92.5  PLT 109*  < > 82* 70*  --  62*  --  85*  --  105*  < > = values in this interval not displayed.  INR:  Recent Labs Lab 09/10/16 0244 09/11/16 0430 09/12/16 0438 09/13/16 0410 09/14/16 0515  INR 1.06 1.32 1.28 1.24 1.54     Other results:  EKG:   Imaging: Dg Chest Port 1 View  Result Date: 09/14/2016 CLINICAL DATA:  Left ventricular assist device placement EXAM: PORTABLE CHEST 1 VIEW COMPARISON:  September 13, 2016 FINDINGS: More superiorly located left chest tube has been removed. Second chest tube more inferiorly position on the left remains without change. Right jugular catheter has been removed. Right subclavian catheter tip is at the cavoatrial junction. Pacemaker leads are attached to the right atrium, right ventricle, and left ventricle. There is a left ventricular assist device present. No pneumothorax. There is patchy consolidation in the left lower lobe. Lungs elsewhere are clear. Cardiomegaly is stable with pulmonary vascularity within normal limits. There is atherosclerotic calcification in the aorta. No adenopathy. IMPRESSION: Tube and catheter positions as described without pneumothorax. There is patchy consolidation in the left lower lobe. Lungs elsewhere clear. Stable cardiomegaly. There is aortic atherosclerosis. Electronically Signed   By: Lowella Grip III M.D.   On: 09/14/2016 08:29   Dg Chest Port 1 View  Result Date: 09/13/2016 CLINICAL DATA:  Congestive heart failure and coronary artery disease. Left ventricular assist device. EXAM: PORTABLE CHEST 1 VIEW COMPARISON:  09/12/2016 FINDINGS: Support apparatus remains in appropriate position, including left chest tube. No pneumothorax visualized. Cardiomegaly is stable. Improved aeration of both lungs. Both lungs are clear. IMPRESSION: Stable cardiomegaly.  No active lung disease. Electronically Signed   By: Earle Gell M.D.   On: 09/13/2016 07:49     Medications:     Scheduled Medications: . sodium chloride   Intravenous Once  . sodium chloride   Intravenous Once  . acetaminophen  1,000 mg Oral Q6H   Or  . acetaminophen (TYLENOL) oral liquid 160 mg/5 mL  1,000 mg Per Tube Q6H  . amiodarone  400 mg Oral BID  . aspirin EC  325 mg  Oral Daily  . atorvastatin  20 mg Oral QHS  . bisacodyl  10 mg Oral Daily  . bisacodyl  10 mg Rectal Once  . dextromethorphan-guaiFENesin  1 tablet Oral BID  . digoxin  250 mcg Oral Daily  .  docusate sodium  200 mg Oral Daily  . escitalopram  20 mg Oral Daily  . furosemide  20 mg Intravenous BID  . insulin aspart  0-24 Units Subcutaneous Q4H  . insulin detemir  10 Units Subcutaneous BID  . mouth rinse  15 mL Mouth Rinse BID  . multivitamin with minerals  1 tablet Oral Daily  . pantoprazole  40 mg Oral Daily  . potassium chloride  40 mEq Oral BID  . spironolactone  25 mg Oral Daily  . Warfarin - Physician Dosing Inpatient   Does not apply q1800    Infusions: . sodium chloride Stopped (09/11/16 1000)  . sodium chloride    . sodium chloride 10 mL/hr at 09/14/16 1100  . bivalirudin (ANGIOMAX) infusion 0.5 mg/mL (Non-ACS indications) 0.04 mg/kg/hr (09/14/16 1100)  . lactated ringers Stopped (09/12/16 1300)  . lactated ringers 10 mL/hr (09/10/16 2030)  . 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  No programming  changes were made.  The LVAD is functioning within specified parameters.  The patient performs LVAD self-test daily.  LVAD interrogation was negative for any significant power changes, alarms or PI events/speed drops.  LVAD equipment check completed and is in good working order.  Back-up equipment present.   LVAD education done on emergency procedures and precautions and reviewed exit site care.  Length of Stay: [place] [facility] 09/14/2016, 12:38 PM

## 2016-09-14 NOTE — Progress Notes (Signed)
Occupational Therapy Treatment Patient Details Name: Ronald Miller. MRN: [id number] DOB: December 04, 1955 Today's Date: 09/14/2016    History of present illness Patient is a 60 yo male admitted 08/22/16 with acute on chronic systolic HF, with LVEF at 15%.   PMH:  chronic HF, ICM, CAD, CABG, ICD, HTN, polysubstance abuse, depression, COPD, CKD, DM   OT comments  Pt moving with less assistance today, but limited by incisional/chest pain.  His focus on his pain limited his ability to manage VAD lines, but continued to provide instruction.    Follow Up Recommendations  Home health OT;Supervision/Assistance - 24 hour    Equipment Recommendations  Tub/shower seat    Recommendations for Other Services      Precautions / Restrictions Precautions Precautions: Sternal Precaution Comments: LVAD Restrictions Other Position/Activity Restrictions: Patient with medial mediastinal lines in place.       Mobility Bed Mobility Overal bed mobility: Needs Assistance Bed Mobility: Sit to Sidelying         Sit to sidelying: Mod assist General bed mobility comments: verbal cues for sequencing and assist to lift LEs onto bed   Transfers Overall transfer level: Needs assistance Equipment used: 1 person hand held assist Transfers: Sit to/from Stand;Stand Pivot Transfers Sit to Stand: Min assist Stand pivot transfers: Min assist       General transfer comment: verbal cues for precautions and sequencing     Balance Overall balance assessment: Needs assistance Sitting-balance support: Feet supported;No upper extremity supported Sitting balance-Leahy Scale: Fair     Standing balance support: No upper extremity supported;During functional activity Standing balance-Leahy Scale: Fair                     ADL Overall ADL's : Needs assistance/impaired                         Toilet Transfer: Minimal Production assistant, radio Details (indicate cue type and  reason): min A to rise and min A for balance  Toileting- Clothing Manipulation and Hygiene: Maximal assistance;Sit to/from stand Toileting - Clothing Manipulation Details (indicate cue type and reason): assist for peri care      Functional mobility during ADLs: Minimal assistance General ADL Comments: instructed pt how to switch from battery > power source, but pt too focused on pain to be able to focus on performing task       Vision                     Perception     Praxis      Cognition   Behavior During Therapy: Anxious Overall Cognitive Status: Within Functional Limits for tasks assessed                       Extremity/Trunk Assessment               Exercises     Shoulder Instructions       General Comments      Pertinent Vitals/ Pain       Pain Assessment: 0-10 Pain Score: 8  Pain Location: chest/sternal area Pain Descriptors / Indicators: Grimacing;Guarding;Sharp;Tender Pain Intervention(s): Monitored during session;Repositioned  Home Living                                          Prior Functioning/Environment  Frequency  Min 3X/week        Progress Toward Goals  OT Goals(current goals can now be found in the care plan section)  Progress towards OT goals: Progressing toward goals  Acute Rehab OT Goals Patient Stated Goal: get out of here  Plan Discharge plan remains appropriate    Co-evaluation                 End of Session Equipment Utilized During Treatment: Oxygen   Activity Tolerance Patient limited by pain   Patient Left in bed;with call bell/phone within reach   Nurse Communication Mobility status        Time: [id number] OT Time Calculation (min): 23 min  Charges: OT General Charges $OT Visit: 1 Procedure OT Treatments $Self Care/Home Management : 23-37 mins  Fynlee Rowlands M 09/14/2016, 3:45 PM

## 2016-09-14 NOTE — Progress Notes (Signed)
Hypoglycemic Event  CBG: 34  Treatment: 15gm snack  Symptoms: sweaty, jittery  Follow-up CBG: Time: 0446 CBG Result: 86  Possible Reasons for Event: Lantus, poor PO intake   Comments/MD notified: followed hypoglycemia protocol.     Bing Quarry

## 2016-09-14 NOTE — Progress Notes (Addendum)
Nutrition Follow-up  DOCUMENTATION CODES:   Obesity unspecified  INTERVENTION:    Glucerna Shake po BID, each supplement provides 220 kcal and 10 grams of protein  NEW NUTRITION DIAGNOSIS:   Increased nutrient needs related to  (post-op healing) as evidenced by estimated needs, ongoing  GOAL:   Patient will meet greater than or equal to 90% of their needs, progressing  MONITOR:   PO intake, Supplement acceptance, Labs, Weight trends, I & O's  ASSESSMENT:   This is a 60 y.o. male with a past medical history significant for chronic systolic CHF with EF 0000000 by echo in 10/2015, mildly dilated RV, s/p medtronic AICD in 2014, CAD s/p CABG in 2000, HTN, remote cocaine abuse, smoking, depression and COPD. He has been admitted twice in May of this year for decompensated CHF with impressive diuresis (from 247 ->219 lbs) and subsequently later in the month with diuresis again down to 228 lbs. He was again admitted with CHF exacerbation in 03/2016 with low cardiac output and biventricular failure. He was started on milrinone gtts and sent home on 0.25 mcg/kg/min. He diuresed 14 lbs to a weight of 227 lbs. Most recent LVEF on echo was 20% with moderate to severe RV hypokinesis. He was referred to Dr. Hoyt Koch at [facility] for transplant work-up, but apparently this was put on hold due to ongoing tobacco use and + cocaine drug screen.  Patient s/p procedures 12/7: REDO STERNOTOMY  INSERTION OF IMPLANTABLE LEFT VENTRICULAR ASSIST DEVICE (N/A) - HeartMate 3 CIRC ARREST  Extubated 12/8. Pt walking the halls with PT. PO intake variable at 30-50% per flowsheets. Would benefit from addition of oral nutrition supplement. CBG's [phone].  Diet Order:  Diet heart healthy/carb modified Room service appropriate? Yes; Fluid consistency: Thin  Skin:  Reviewed, no issues  Last BM:  12/6  Height:   Ht Readings from Last 1 Encounters:  08/22/16 5\' 9"  (1.753 m)    Weight:   Wt Readings  from Last 1 Encounters:  09/14/16 229 lb 8 oz (104.1 kg)    Ideal Body Weight:  72.7 kg  BMI:  Body mass index is 33.89 kg/m.  Estimated Nutritional Needs:   Kcal:  1800-2000  Protein:  95-110 grams  Fluid:  per MD  EDUCATION NEEDS:   No education needs identified at this time  Arthur Holms, RD, LDN Pager #: [phone] After-Hours Pager #: [phone]

## 2016-09-14 NOTE — Care Management Important Message (Signed)
Important Message  Patient Details  Name: Ronald Miller. MRN: [id number] Date of Birth: 1956/01/28   Medicare Important Message Given:  Yes    Kaleel Schmieder [place] 09/14/2016, 10:07 AM

## 2016-09-15 ENCOUNTER — Inpatient Hospital Stay (HOSPITAL_COMMUNITY): Payer: Medicare HMO

## 2016-09-15 LAB — GLUCOSE, CAPILLARY
GLUCOSE-CAPILLARY: 159 mg/dL — AB (ref 65–99)
GLUCOSE-CAPILLARY: 153 mg/dL — AB (ref 65–99)
Glucose-Capillary: 94 mg/dL (ref 65–99)
Glucose-Capillary: 120 mg/dL — ABNORMAL HIGH (ref 65–99)
Glucose-Capillary: 125 mg/dL — ABNORMAL HIGH (ref 65–99)

## 2016-09-15 LAB — COMPREHENSIVE METABOLIC PANEL
ALK PHOS: 40 U/L (ref 38–126)
ALT: 50 U/L (ref 17–63)
ANION GAP: 6 (ref 5–15)
AST: 25 U/L (ref 15–41)
Albumin: 2.1 g/dL — ABNORMAL LOW (ref 3.5–5.0)
BILIRUBIN TOTAL: 0.4 mg/dL (ref 0.3–1.2)
BUN: 14 mg/dL (ref 6–20)
CALCIUM: 6.6 mg/dL — AB (ref 8.9–10.3)
CO2: 28 mmol/L (ref 22–32)
Chloride: 104 mmol/L (ref 101–111)
Creatinine, Ser: 0.72 mg/dL (ref 0.61–1.24)
GFR calc non Af Amer: >60 mL/min (ref >60)
Glucose, Bld: 106 mg/dL — ABNORMAL HIGH (ref 65–99)
Potassium: 3.1 mmol/L — ABNORMAL LOW (ref 3.5–5.1)
SODIUM: 138 mmol/L (ref 135–145)
TOTAL PROTEIN: 4.6 g/dL — AB (ref 6.5–8.1)

## 2016-09-15 LAB — CBC WITH DIFFERENTIAL/PLATELET
Basophils Absolute: 0 10*3/uL (ref 0.0–0.1)
Basophils Relative: 0 %
Eosinophils Absolute: 0.2 10*3/uL (ref 0.0–0.7)
Eosinophils Relative: 2 %
HCT: 25.5 % — ABNORMAL LOW (ref 39.0–52.0)
Hemoglobin: 8.6 g/dL — ABNORMAL LOW (ref 13.0–17.0)
Lymphocytes Relative: 12 %
Lymphs Abs: 1.1 10*3/uL (ref 0.7–4.0)
MCH: 31.2 pg (ref 26.0–34.0)
MCHC: 33.7 g/dL (ref 30.0–36.0)
MCV: 92.4 fL (ref 78.0–100.0)
Monocytes Absolute: 1.2 10*3/uL — ABNORMAL HIGH (ref 0.1–1.0)
Monocytes Relative: 14 %
Neutro Abs: 6.2 10*3/uL (ref 1.7–7.7)
Neutrophils Relative %: 72 %
Platelets: 138 10*3/uL — ABNORMAL LOW (ref 150–400)
RBC: 2.76 MIL/uL — ABNORMAL LOW (ref 4.22–5.81)
RDW: 14.1 % (ref 11.5–15.5)
WBC: 8.7 10*3/uL (ref 4.0–10.5)

## 2016-09-15 LAB — APTT: aPTT: 62 seconds — ABNORMAL HIGH (ref 24–36)

## 2016-09-15 LAB — COOXEMETRY PANEL
Carboxyhemoglobin: 2.1 % — ABNORMAL HIGH (ref 0.5–1.5)
Methemoglobin: 0.7 % (ref 0.0–1.5)
O2 Saturation: 56.2 %
Total hemoglobin: 5.9 g/dL — CL (ref 12.0–16.0)

## 2016-09-15 LAB — MAGNESIUM: MAGNESIUM: 1.8 mg/dL (ref 1.7–2.4)

## 2016-09-15 LAB — PROTIME-INR
INR: 1.83
Prothrombin Time: 21.4 seconds — ABNORMAL HIGH (ref 11.4–15.2)

## 2016-09-15 LAB — LACTATE DEHYDROGENASE: LDH: 202 U/L — ABNORMAL HIGH (ref 98–192)

## 2016-09-15 MED ORDER — WARFARIN - PHYSICIAN DOSING INPATIENT: Freq: Every day | Status: DC

## 2016-09-15 MED ORDER — MAGNESIUM OXIDE 400 (241.3 MG) MG PO TABS
400.0000 mg | ORAL_TABLET | Freq: Every day | ORAL | Status: DC
  Administered 2016-09-16 – 2016-09-25 (×10): 400 mg via ORAL
  Filled 2016-09-15 (×10): qty 1

## 2016-09-15 MED ORDER — POTASSIUM CHLORIDE CRYS ER 20 MEQ PO TBCR
40.0000 meq | EXTENDED_RELEASE_TABLET | Freq: Once | ORAL | Status: AC
  Administered 2016-09-15: 40 meq via ORAL
  Filled 2016-09-15: qty 2

## 2016-09-15 MED ORDER — POTASSIUM CHLORIDE 2 MEQ/ML IV SOLN
30.0000 meq | Freq: Once | INTRAVENOUS | Status: AC
  Administered 2016-09-15: 30 meq via INTRAVENOUS
  Filled 2016-09-15: qty 15

## 2016-09-15 MED ORDER — FUROSEMIDE 10 MG/ML IJ SOLN
40.0000 mg | Freq: Two times a day (BID) | INTRAMUSCULAR | Status: DC
  Administered 2016-09-15 – 2016-09-17 (×5): 40 mg via INTRAVENOUS
  Filled 2016-09-15 (×6): qty 4

## 2016-09-15 MED ORDER — PREGABALIN 75 MG PO CAPS
75.0000 mg | ORAL_CAPSULE | Freq: Every day | ORAL | Status: DC
  Administered 2016-09-15 – 2016-09-25 (×11): 75 mg via ORAL
  Filled 2016-09-15 (×12): qty 1

## 2016-09-15 MED ORDER — FENTANYL CITRATE (PF) 100 MCG/2ML IJ SOLN: 50.0000 ug | Freq: Four times a day (QID) | INTRAMUSCULAR | Status: DC | PRN

## 2016-09-15 MED ORDER — WARFARIN SODIUM 5 MG PO TABS
5.0000 mg | ORAL_TABLET | Freq: Every day | ORAL | Status: DC
  Administered 2016-09-15: 5 mg via ORAL
  Filled 2016-09-15: qty 1

## 2016-09-15 NOTE — Progress Notes (Signed)
HeartMate 3  Rounding Note Postop day #6 implantation of HeartMate 3  [09-09-16] Subjective:    History of ischemic cardiomyopathy, EF 15% status post CABG 6 2001 History of COPD, substance abuse, AICD placement and moderate RV dysfunction Postoperative coagulopathy and acute blood loss anemia requiring transfusion and blood product administration Patient extubated postop day 1 with stable hemodynamics Excellent diuresis on lasix drip ,Now on 40 mg IV twice a day Coumadin dosing Started however INR remains subtherapeutic-IV bivalirudin started because of low postop platelet count and to avoid heparin exposure  Patient complains of not feeling well today lack of sleep Patient had bowel movement last night Patient started on amiodarone for atrial fibrillation now in sinus rhythm Co-ox is stable CVP stable  LVAD INTERROGATION:  HeartMate II LVAD:  Flow 5.2 liters/min, speed 5600 RPM, power 4.1, PI 2.2.  Controller intact and connected  Objective:    Vital Signs:   Temp:  [97.6 F (36.4 C)-98.5 F (36.9 C)] 98 F (36.7 C) (12/13 1100) Pulse Rate:  [32-108] 43 (12/13 0900) Resp:  [0-25] 20 (12/13 0900) BP: (65-105)/(22-91) 105/91 (12/13 1600) SpO2:  [67 %-96 %] 67 % (12/13 0900) Last BM Date: 09/15/16 Mean arterial Pressure 75 mmHg  Intake/Output:   Intake/Output Summary (Last 24 hours) at 09/15/16 1625 Last data filed at 09/15/16 1500  Gross per 24 hour  Intake           819.27 ml  Output             1811 ml  Net          -991.73 ml     Physical Exam: General:  Well appearing. No resp difficulty HEENT: normal Neck: supple. No  JVP .Marland Kitchen No lymphadenopathy or thryomegaly appreciated. Cor: Mechanical heart sounds with LVAD hum present. Lungs: clear Abdomen: soft, nontender,  Mildly distended. No hepatosplenomegaly. No bruits or masses. Good bowel sounds. Extremities: no cyanosis, clubbing, rash, edema Neuro: alert & orientedx3, cranial nerves grossly intact. moves all 4  extremities w/o difficulty. Affect pleasant  Telemetry: Atrial fibrillation  Labs: Basic Metabolic Panel:  Recent Labs Lab 09/10/16 0244  09/11/16 0430  09/12/16 0438  09/13/16 0410 09/13/16 1605 09/14/16 0515 09/14/16 1618 09/15/16 0630  NA 138  < > 136  < > 133*  < > 136 134* 134* 134* 138  K 4.2  < > 5.0  < > 3.3*  < > 2.7* 3.3* 3.4* 3.7 3.1*  CL 107  < > 103  < > 95*  < > 91* 86* 90* 87* 104  CO2 21*  --  23  --  28  --  34*  --  35*  --  28  GLUCOSE 171*  < > 179*  < > 119*  < > 82 175* 138* 190* 106*  BUN 11  < > 16  < > 19  < > 21* 23* 19 21* 14  CREATININE 1.08  < > 1.31*  < > 1.22  < > 1.12 1.10 0.84 0.90 0.72  CALCIUM 8.4*  --  8.2*  --  8.2*  --  8.3*  --  8.3*  --  6.6*  MG 2.2  < > 2.0  --  1.8  --  1.8  --  1.8  --  1.8  PHOS 3.0  --  3.8  --  2.7  --   --   --   --   --   --   < > = values in  this interval not displayed.  Liver Function Tests:  Recent Labs Lab 09/10/16 0244 09/11/16 0430 09/12/16 0438 09/15/16 0630  AST 114* 151* 136* 25  ALT 23 55 111* 50  ALKPHOS 44 52 55 40  BILITOT 2.2* 1.9* 1.5* 0.4  PROT 5.4* 6.0* 6.0* 4.6*  ALBUMIN 3.8 3.5 3.3* 2.1*   No results for input(s): LIPASE, AMYLASE in the last 168 hours. No results for input(s): AMMONIA in the last 168 hours.  CBC:  Recent Labs Lab 09/11/16 0430  09/12/16 0438  09/13/16 0410 09/13/16 1605 09/14/16 0515 09/14/16 1618 09/15/16 0400  WBC 13.7*  --  11.6*  --  11.5*  --  11.4*  --  8.7  NEUTROABS 11.6*  --  10.0*  --  9.6*  --  10.0*  --  6.2  HGB 9.0*  < > 8.4*  < > 8.6* 8.8* 9.4* 8.8* 8.6*  HCT 26.6*  < > 24.4*  < > 25.2* 26.0* 28.2* 26.0* 25.5*  MCV 92.0  --  91.0  --  91.3  --  92.5  --  92.4  PLT 70*  --  62*  --  85*  --  105*  --  138*  < > = values in this interval not displayed.  INR:  Recent Labs Lab 09/11/16 0430 09/12/16 0438 09/13/16 0410 09/14/16 0515 09/15/16 0400  INR 1.32 1.28 1.24 1.54 1.83    Other results:  EKG:   Imaging: Dg Chest Port  1 View  Result Date: 09/15/2016 CLINICAL DATA:  60 year old male status post LVAD. Initial encounter. EXAM: PORTABLE CHEST 1 VIEW COMPARISON:  09/14/2016 and earlier. FINDINGS: Portable AP semi upright view at 0556 hours. Visible LVAD hardware appears stable. Superimposed left chest cardiac AICD. Stable right PICC line. Stable cardiomegaly and mediastinal contours. Visualized tracheal air column is within normal limits. Continued retrocardiac hypo ventilation, unchanged. No superimposed pneumothorax, pulmonary edema or confluent right lung opacity. Possible small left pleural effusion. IMPRESSION: 1. Stable lungs with retrocardiac hypoventilation. Possible small left pleural effusion, but no pneumothorax or edema. 2. Stable visible LVAD hardware, left chest cardiac AICD and right PICC line. Electronically Signed   By: Genevie Ann M.D.   On: 09/15/2016 07:31   Dg Chest Port 1 View  Result Date: 09/14/2016 CLINICAL DATA:  Left ventricular assist device placement EXAM: PORTABLE CHEST 1 VIEW COMPARISON:  September 13, 2016 FINDINGS: More superiorly located left chest tube has been removed. Second chest tube more inferiorly position on the left remains without change. Right jugular catheter has been removed. Right subclavian catheter tip is at the cavoatrial junction. Pacemaker leads are attached to the right atrium, right ventricle, and left ventricle. There is a left ventricular assist device present. No pneumothorax. There is patchy consolidation in the left lower lobe. Lungs elsewhere are clear. Cardiomegaly is stable with pulmonary vascularity within normal limits. There is atherosclerotic calcification in the aorta. No adenopathy. IMPRESSION: Tube and catheter positions as described without pneumothorax. There is patchy consolidation in the left lower lobe. Lungs elsewhere clear. Stable cardiomegaly. There is aortic atherosclerosis. Electronically Signed   By: Lowella Grip III M.D.   On: 09/14/2016 08:29      Medications:     Scheduled Medications: . sodium chloride   Intravenous Once  . sodium chloride   Intravenous Once  . amiodarone  400 mg Oral BID  . aspirin EC  325 mg Oral Daily  . atorvastatin  20 mg Oral QHS  . bisacodyl  10 mg  Oral Daily  . dextromethorphan-guaiFENesin  1 tablet Oral BID  . digoxin  250 mcg Oral Daily  . docusate sodium  200 mg Oral Daily  . escitalopram  20 mg Oral Daily  . feeding supplement (GLUCERNA SHAKE)  237 mL Oral BID BM  . furosemide  40 mg Intravenous BID  . insulin aspart  0-24 Units Subcutaneous Q4H  . insulin detemir  10 Units Subcutaneous BID  . mouth rinse  15 mL Mouth Rinse BID  . multivitamin with minerals  1 tablet Oral Daily  . pantoprazole  40 mg Oral Daily  . potassium chloride  40 mEq Oral BID  . pregabalin  75 mg Oral Daily  . spironolactone  25 mg Oral Daily  . warfarin  5 mg Oral q1800  . Warfarin - Physician Dosing Inpatient   Does not apply q1800    Infusions: . sodium chloride Stopped (09/11/16 1000)  . sodium chloride    . sodium chloride 10 mL/hr at 09/15/16 1200  . bivalirudin (ANGIOMAX) infusion 0.5 mg/mL (Non-ACS indications) 0.03 mg/kg/hr (09/15/16 1200)  . lactated ringers Stopped (09/12/16 1300)  . lactated ringers 10 mL/hr (09/10/16 2030)  . 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  No programming changes were made.  The LVAD is functioning within specified parameters.  The patient performs LVAD self-test daily.  LVAD interrogation was negative for any significant power changes, alarms or PI events/speed drops.  LVAD equipment check completed and is in good working order.  Back-up equipment present.   LVAD education done on emergency procedures and precautions and reviewed exit site care.  Length of Stay: [place] 09/15/2016, 4:25 PM

## 2016-09-15 NOTE — Care Management Note (Signed)
Case Management Note  Patient Details  Name: Ronald Miller. MRN: [id number] Date of Birth: 1955-11-24  Subjective/Objective:    Pt is s/p LVAD                Action/Plan:  PTA independent from home with mom and sister - pt states that both will be his support upon discharge.  Pt active with [facility] and IV milrinone - Agency made aware that pt is admitted.  Pt has successfully ambulated around unit during ICU stay and is now being transferred to 2W.  Pt is followed by LVAD and HF team.  CM will continue to follow for discharge needs   Expected Discharge Date:  08/30/16               Expected Discharge Plan:  [place]  In-House Referral:     Discharge planning Services  CM Consult  Post Acute Care Choice:  Resumption of Svcs/PTA Provider Choice offered to:     DME Arranged:    DME Agency:     HH Arranged:  RN, Disease Management [place] Agency:  [place]  Status of Service:  In process, will continue to follow  If discussed at Long Length of Stay Meetings, dates discussed:    Additional Comments:  Maryclare Labrador, RN 09/15/2016, 10:13 AM

## 2016-09-15 NOTE — Progress Notes (Signed)
HeartMate 2 Rounding Note  Subjective:    S/P HMII Post Op Day 6  Improving. Walked the whole unit yesterday. 2 BMs. Breathing better. C/o chest burning, I/Os even. Back in NSR on amio  Remains on milrinone. MAPs 70s.   CO-OX 56% -> 64%-> 68% -> 53%-> 56% LVAD INTERROGATION:  HeartMate III LVAD:  Flow 5.1 liters/min, speed 5600, power 4.1, PI 2.9.  Occasional PI events.   Objective:    Vital Signs:   Temp:  [97.6 F (36.4 C)-98.5 F (36.9 C)] 97.9 F (36.6 C) (12/13 0700) Pulse Rate:  [32-110] 43 (12/13 0900) Resp:  [0-25] 20 (12/13 0900) BP: (65-107)/(22-87) 97/86 (12/13 0900) SpO2:  [67 %-96 %] 67 % (12/13 0900) Last BM Date: 09/08/16 Mean arterial Pressure 76  Intake/Output:   Intake/Output Summary (Last 24 hours) at 09/15/16 0944 Last data filed at 09/15/16 0900  Gross per 24 hour  Intake           937.97 ml  Output             2576 ml  Net         -1638.03 ml     Physical Exam: CVP 9-10 General:  Sitting in bed. NAD.  HEENT: normal Neck: supple. JVP remains elevated. +IJ TLC Carotids 2+ bilat; no bruits. No thyromegaly or nodule noted. RIJ swan  Cor: Mechanical heart sounds with LVAD hum present. + CTs Lungs: CTAB, normal effort.  Abdomen: soft, NT, nondistended, no HSM. No bruits or masses. +BS  Driveline: C/D/I; securement device intact and driveline incorporated Extremities: no cyanosis, clubbing, rash, Trace  edema Neuro: Awake and oriented. Moves all 4 extremities on command and without difficulty.  GU: Foley  Telemetry: Reviewed, V-paced 70s  Labs: Basic Metabolic Panel:  Recent Labs Lab 09/10/16 0244  09/11/16 0430  09/12/16 0438  09/13/16 0410 09/13/16 1605 09/14/16 0515 09/14/16 1618 09/15/16 0630  NA 138  < > 136  < > 133*  < > 136 134* 134* 134* 138  K 4.2  < > 5.0  < > 3.3*  < > 2.7* 3.3* 3.4* 3.7 3.1*  CL 107  < > 103  < > 95*  < > 91* 86* 90* 87* 104  CO2 21*  --  23  --  28  --  34*  --  35*  --  28  GLUCOSE 171*  < > 179*  <  > 119*  < > 82 175* 138* 190* 106*  BUN 11  < > 16  < > 19  < > 21* 23* 19 21* 14  CREATININE 1.08  < > 1.31*  < > 1.22  < > 1.12 1.10 0.84 0.90 0.72  CALCIUM 8.4*  --  8.2*  --  8.2*  --  8.3*  --  8.3*  --  6.6*  MG 2.2  < > 2.0  --  1.8  --  1.8  --  1.8  --  1.8  PHOS 3.0  --  3.8  --  2.7  --   --   --   --   --   --   < > = values in this interval not displayed.  Liver Function Tests:  Recent Labs Lab 09/10/16 0244 09/11/16 0430 09/12/16 0438 09/15/16 0630  AST 114* 151* 136* 25  ALT 23 55 111* 50  ALKPHOS 44 52 55 40  BILITOT 2.2* 1.9* 1.5* 0.4  PROT 5.4* 6.0* 6.0* 4.6*  ALBUMIN 3.8 3.5 3.3*  2.1*   No results for input(s): LIPASE, AMYLASE in the last 168 hours. No results for input(s): AMMONIA in the last 168 hours.  CBC:  Recent Labs Lab 09/11/16 0430  09/12/16 0438  09/13/16 0410 09/13/16 1605 09/14/16 0515 09/14/16 1618 09/15/16 0400  WBC 13.7*  --  11.6*  --  11.5*  --  11.4*  --  8.7  NEUTROABS 11.6*  --  10.0*  --  9.6*  --  10.0*  --  6.2  HGB 9.0*  < > 8.4*  < > 8.6* 8.8* 9.4* 8.8* 8.6*  HCT 26.6*  < > 24.4*  < > 25.2* 26.0* 28.2* 26.0* 25.5*  MCV 92.0  --  91.0  --  91.3  --  92.5  --  92.4  PLT 70*  --  62*  --  85*  --  105*  --  138*  < > = values in this interval not displayed.  INR:  Recent Labs Lab 09/11/16 0430 09/12/16 0438 09/13/16 0410 09/14/16 0515 09/15/16 0400  INR 1.32 1.28 1.24 1.54 1.83    Other results:    Imaging: Dg Chest Port 1 View  Result Date: 09/15/2016 CLINICAL DATA:  60 year old male status post LVAD. Initial encounter. EXAM: PORTABLE CHEST 1 VIEW COMPARISON:  09/14/2016 and earlier. FINDINGS: Portable AP semi upright view at 0556 hours. Visible LVAD hardware appears stable. Superimposed left chest cardiac AICD. Stable right PICC line. Stable cardiomegaly and mediastinal contours. Visualized tracheal air column is within normal limits. Continued retrocardiac hypo ventilation, unchanged. No superimposed  pneumothorax, pulmonary edema or confluent right lung opacity. Possible small left pleural effusion. IMPRESSION: 1. Stable lungs with retrocardiac hypoventilation. Possible small left pleural effusion, but no pneumothorax or edema. 2. Stable visible LVAD hardware, left chest cardiac AICD and right PICC line. Electronically Signed   By: Genevie Ann M.D.   On: 09/15/2016 07:31   Dg Chest Port 1 View  Result Date: 09/14/2016 CLINICAL DATA:  Left ventricular assist device placement EXAM: PORTABLE CHEST 1 VIEW COMPARISON:  September 13, 2016 FINDINGS: More superiorly located left chest tube has been removed. Second chest tube more inferiorly position on the left remains without change. Right jugular catheter has been removed. Right subclavian catheter tip is at the cavoatrial junction. Pacemaker leads are attached to the right atrium, right ventricle, and left ventricle. There is a left ventricular assist device present. No pneumothorax. There is patchy consolidation in the left lower lobe. Lungs elsewhere are clear. Cardiomegaly is stable with pulmonary vascularity within normal limits. There is atherosclerotic calcification in the aorta. No adenopathy. IMPRESSION: Tube and catheter positions as described without pneumothorax. There is patchy consolidation in the left lower lobe. Lungs elsewhere clear. Stable cardiomegaly. There is aortic atherosclerosis. Electronically Signed   By: Lowella Grip III M.D.   On: 09/14/2016 08:29     Medications:     Scheduled Medications: . sodium chloride   Intravenous Once  . sodium chloride   Intravenous Once  . amiodarone  400 mg Oral BID  . aspirin EC  325 mg Oral Daily  . atorvastatin  20 mg Oral QHS  . bisacodyl  10 mg Oral Daily  . dextromethorphan-guaiFENesin  1 tablet Oral BID  . digoxin  250 mcg Oral Daily  . docusate sodium  200 mg Oral Daily  . escitalopram  20 mg Oral Daily  . feeding supplement (GLUCERNA SHAKE)  237 mL Oral BID BM  . furosemide  20  mg Intravenous BID  .  insulin aspart  0-24 Units Subcutaneous Q4H  . insulin detemir  10 Units Subcutaneous BID  . mouth rinse  15 mL Mouth Rinse BID  . multivitamin with minerals  1 tablet Oral Daily  . pantoprazole  40 mg Oral Daily  . potassium chloride (KCL MULTIRUN) 30 mEq in 265 mL IVPB  30 mEq Intravenous Once  . potassium chloride  40 mEq Oral BID  . spironolactone  25 mg Oral Daily  . warfarin  5 mg Oral q1800  . Warfarin - Physician Dosing Inpatient   Does not apply q1800    Infusions: . sodium chloride Stopped (09/11/16 1000)  . sodium chloride    . sodium chloride 10 mL/hr at 09/15/16 0900  . bivalirudin (ANGIOMAX) infusion 0.5 mg/mL (Non-ACS indications) 0.03 mg/kg/hr (09/15/16 0900)  . lactated ringers Stopped (09/12/16 1300)  . lactated ringers 10 mL/hr (09/10/16 2030)  . milrinone 0.3 mcg/kg/min (09/15/16 0900)    PRN Medications: sodium chloride, albumin human, albuterol, fentaNYL (SUBLIMAZE) injection, Melatonin, ondansetron (ZOFRAN) IV, oxyCODONE, senna, sodium chloride flush, sodium chloride flush   Assessment/Plan/Discussion    1. 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  Echo 08/24/16 LVEF 15%, RV mild dilated, moderately reduced.  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  Continue IV lasix. Will increase to 40 bid today  - Co-ox improved at 56%.  Continue milrinone 0.3 mcg/kg/min for now. Hope to wean once diuresed.   - VAD parameters and renal function stable. LDH ok - Continue to mobilize as tolerated.  2. AKI on CKD III: - Stable. Continue to follow with diuresis.   3. Hypercarbic respiratory failure - Encourage IS. Limit sedation. Diurese.  4. CAD: No CP. 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  5. DMII: Hgb A1C 8.3 on 08/26/2016  - Cover with SSI  6. RLE cellulitis: Completed 8 days of Vancomycin.  7. Tobacco  abuse 8. Hypokalemia:  - Got extra 30 meq this am. Continue to supp as needed. Continue chronic supp.  - Continue spiro.  9. Atrial fibrillation - back in NSR on amio. Will continue 10. Pocket pain - start Lyrica  I reviewed the LVAD parameters from today, and compared the results to the patient's prior recorded data.  No programming changes were made.  The LVAD is functioning within specified parameters.  The patient performs LVAD self-test daily.  LVAD interrogation was negative for any significant power changes, alarms or PI events/speed drops.  LVAD equipment check completed and is in good working order.  Back-up equipment present.   LVAD education done on emergency procedures and precautions and reviewed exit site care.  Length of Stay: 24  Glori Bickers, MD 09/15/2016, 9:44 AM  VAD Team --- VAD ISSUES ONLY--- Pager [phone] (7am - 7am)  Advanced Heart Failure Team  Pager [phone] (M-F; 7a - 4p)  Please contact [facility] for night-coverage after hours (4p -7a ) and weekends on [email or web address]

## 2016-09-15 NOTE — Progress Notes (Signed)
PT Cancellation Note  Patient Details Name: Ronald Miller. MRN: [id number] DOB: 08-15-1956   Cancelled Treatment:    Reason Eval/Treat Not Completed: Patient declined due to not feeling well.   Ronald Miller 09/15/2016, 10:25 AM Ronald Miller PT [phone]

## 2016-09-16 ENCOUNTER — Inpatient Hospital Stay (HOSPITAL_COMMUNITY): Payer: Medicare HMO

## 2016-09-16 DIAGNOSIS — I480 Paroxysmal atrial fibrillation: Secondary | ICD-10-CM

## 2016-09-16 LAB — CBC WITH DIFFERENTIAL/PLATELET
Basophils Absolute: 0 10*3/uL (ref 0.0–0.1)
Basophils Relative: 0 %
Eosinophils Absolute: 0.3 10*3/uL (ref 0.0–0.7)
Eosinophils Relative: 2 %
HCT: 27.7 % — ABNORMAL LOW (ref 39.0–52.0)
Hemoglobin: 9 g/dL — ABNORMAL LOW (ref 13.0–17.0)
Lymphocytes Relative: 9 %
Lymphs Abs: 1.1 10*3/uL (ref 0.7–4.0)
MCH: 30.5 pg (ref 26.0–34.0)
MCHC: 32.5 g/dL (ref 30.0–36.0)
MCV: 93.9 fL (ref 78.0–100.0)
Monocytes Absolute: 1.3 10*3/uL — ABNORMAL HIGH (ref 0.1–1.0)
Monocytes Relative: 11 %
Neutro Abs: 9.3 10*3/uL — ABNORMAL HIGH (ref 1.7–7.7)
Neutrophils Relative %: 78 %
Platelets: 206 10*3/uL (ref 150–400)
RBC: 2.95 MIL/uL — ABNORMAL LOW (ref 4.22–5.81)
RDW: 14.7 % (ref 11.5–15.5)
WBC: 12 10*3/uL — ABNORMAL HIGH (ref 4.0–10.5)

## 2016-09-16 LAB — COMPREHENSIVE METABOLIC PANEL
ALT: 48 U/L (ref 17–63)
AST: 25 U/L (ref 15–41)
Albumin: 2.7 g/dL — ABNORMAL LOW (ref 3.5–5.0)
Alkaline Phosphatase: 52 U/L (ref 38–126)
Anion gap: 6 (ref 5–15)
BUN: 18 mg/dL (ref 6–20)
CO2: 33 mmol/L — ABNORMAL HIGH (ref 22–32)
Calcium: 8.2 mg/dL — ABNORMAL LOW (ref 8.9–10.3)
Chloride: 96 mmol/L — ABNORMAL LOW (ref 101–111)
Creatinine, Ser: 0.94 mg/dL (ref 0.61–1.24)
GFR calc Af Amer: >60 mL/min (ref >60)
GFR calc non Af Amer: >60 mL/min (ref >60)
Glucose, Bld: 107 mg/dL — ABNORMAL HIGH (ref 65–99)
Potassium: 4.6 mmol/L (ref 3.5–5.1)
Sodium: 135 mmol/L (ref 135–145)
Total Bilirubin: 0.5 mg/dL (ref 0.3–1.2)
Total Protein: 5.8 g/dL — ABNORMAL LOW (ref 6.5–8.1)

## 2016-09-16 LAB — GLUCOSE, CAPILLARY
GLUCOSE-CAPILLARY: 104 mg/dL — AB (ref 65–99)
Glucose-Capillary: 127 mg/dL — ABNORMAL HIGH (ref 65–99)
Glucose-Capillary: 128 mg/dL — ABNORMAL HIGH (ref 65–99)
Glucose-Capillary: 146 mg/dL — ABNORMAL HIGH (ref 65–99)
Glucose-Capillary: 93 mg/dL (ref 65–99)
Glucose-Capillary: 96 mg/dL (ref 65–99)

## 2016-09-16 LAB — COOXEMETRY PANEL
CARBOXYHEMOGLOBIN: 1.6 % — AB (ref 0.5–1.5)
Carboxyhemoglobin: 2.6 % — ABNORMAL HIGH (ref 0.5–1.5)
Carboxyhemoglobin: 1.2 % (ref 0.5–1.5)
Methemoglobin: 0.4 % (ref 0.0–1.5)
Methemoglobin: 0.7 % (ref 0.0–1.5)
Methemoglobin: 0.8 % (ref 0.0–1.5)
O2 SAT: 39.6 %
O2 Saturation: 90.6 %
O2 Saturation: 35.2 %
TOTAL HEMOGLOBIN: 18.7 g/dL — AB (ref 12.0–16.0)
TOTAL HEMOGLOBIN: 10.2 g/dL — AB (ref 12.0–16.0)
Total hemoglobin: 9.1 g/dL — ABNORMAL LOW (ref 12.0–16.0)

## 2016-09-16 LAB — BRAIN NATRIURETIC PEPTIDE: B Natriuretic Peptide: 1132 pg/mL — ABNORMAL HIGH (ref 0.0–100.0)

## 2016-09-16 LAB — PROTIME-INR
INR: 1.44
Prothrombin Time: 17.7 seconds — ABNORMAL HIGH (ref 11.4–15.2)

## 2016-09-16 LAB — MAGNESIUM: Magnesium: 2.2 mg/dL (ref 1.7–2.4)

## 2016-09-16 LAB — LACTATE DEHYDROGENASE: LDH: 268 U/L — ABNORMAL HIGH (ref 98–192)

## 2016-09-16 LAB — APTT: aPTT: 66 seconds — ABNORMAL HIGH (ref 24–36)

## 2016-09-16 MED ORDER — PATIENT'S GUIDE TO USING COUMADIN BOOK
Freq: Once | Status: AC
  Administered 2016-09-16: 12:00:00
  Filled 2016-09-16: qty 1

## 2016-09-16 MED ORDER — WARFARIN - PHARMACIST DOSING INPATIENT: Freq: Every day | Status: DC

## 2016-09-16 MED ORDER — WARFARIN VIDEO: Freq: Once | Status: DC

## 2016-09-16 MED ORDER — MILRINONE LACTATE IN DEXTROSE 20-5 MG/100ML-% IV SOLN
0.2500 ug/kg/min | INTRAVENOUS | Status: DC
  Administered 2016-09-16 – 2016-09-17 (×4): 0.3 ug/kg/min via INTRAVENOUS
  Administered 2016-09-18 – 2016-09-22 (×12): 0.375 ug/kg/min via INTRAVENOUS
  Administered 2016-09-23: 0.25 ug/kg/min via INTRAVENOUS
  Filled 2016-09-16 (×20): qty 100

## 2016-09-16 MED ORDER — WARFARIN SODIUM 7.5 MG PO TABS
7.5000 mg | ORAL_TABLET | Freq: Once | ORAL | Status: AC
  Administered 2016-09-16: 7.5 mg via ORAL
  Filled 2016-09-16: qty 1

## 2016-09-16 MED FILL — Dextrose Inj 5%: INTRAVENOUS | Qty: 250 | Status: AC

## 2016-09-16 MED FILL — Sodium Chloride IV Soln 0.9%: INTRAVENOUS | Qty: 250 | Status: AC

## 2016-09-16 MED FILL — Vasopressin Inj 20 Unit/ML: INTRAMUSCULAR | Qty: 3 | Status: AC

## 2016-09-16 MED FILL — Phenylephrine HCl Inj 10 MG/ML: INTRAMUSCULAR | Qty: 2 | Status: AC

## 2016-09-16 NOTE — Progress Notes (Signed)
LVAD Coordinator Advanced Heart Failure Rounds:  Implanted 09/09/16 with HM3 as Bridge to Transplant by Dr. Tharon Aquas Trigt.   Doing well this morning sitting in the chair. "Very sore and just got comfortable." He reports he can do controller self test. Was able to confirm correct controller soft key, however could not initiate self-test. Educated to continue hold until alarms started.   LVAD interrogation reveals:  Speed: 5600 Flow: 5.2 Power: 4.2w PI: 2.8 Alarms: no clinical alarms Events:  Fixed speed: 5600 Low speed limit: 5300  Back up equipment present.   LVAD is functioning within expected parameters.   Drive Line: C/D/I. Changed daily with Aquacel Ag silver. Sister has not performed any dressing changes at this point. His father has recent experience with VAD Driveline care to supplement.   Discussed that she needs to do dressing every day prior to him being discharged. He is expected to be D/C'd home Monday per Dr. Haroldine Laws.    Plan/Recommendations:  Discharge planning scheduled for Friday 09/17/16 at 10:30 with sister and mother.   Mr. Pasierb was able to demonstrate controller self-test (with assistance from me), inserting/removing batteries from clips and exchanging power sources. He is not at this point able to identify equipment. Educated about PM/UBC.   [name], RN VAD Coordinator   Office: [phone] 24/7 Emergency VAD Pager: [phone]

## 2016-09-16 NOTE — Progress Notes (Signed)
ANTICOAGULATION CONSULT NOTE - Initial Consult  Pharmacy Consult for Coumadin Indication: LVAD  Allergies  Allergen Reactions  . Codeine Nausea And Vomiting  . Lipitor [Atorvastatin] Nausea Only    Nausea with high doses, tolerates 20mg  dose (08/22/16)    Patient Measurements: Height: 5\' 9"  (175.3 cm) Weight: 227 lb 3.2 oz (103.1 kg) IBW/kg (Calculated) : 70.7 Heparin Dosing Weight: n/a  Vital Signs: Temp: 97.8 F (36.6 C) (12/14 0432) BP: 108/51 (12/14 0800) Pulse Rate: 93 (12/14 1121)  Labs:  Recent Labs  09/14/16 0515 09/14/16 1618 09/15/16 0400 09/15/16 0630 09/16/16 0400  HGB 9.4* 8.8* 8.6*  --  9.0*  HCT 28.2* 26.0* 25.5*  --  27.7*  PLT 105*  --  138*  --  206  APTT 70*  --  62*  --  66*  LABPROT 18.7*  --  21.4*  --  17.7*  INR 1.54  --  1.83  --  1.44  CREATININE 0.84 0.90  --  0.72 0.94    Estimated Creatinine Clearance: 100.2 mL/min (by C-G formula based on SCr of 0.94 mg/dL).   Medical History: Past Medical History:  Diagnosis Date  . AICD (automatic cardioverter/defibrillator) present   . ASCVD (arteriosclerotic cardiovascular disease)   . Chronic systolic CHF (congestive heart failure) ([place])   . COPD (chronic obstructive pulmonary disease) ([place])   . Coronary artery disease   . Depression   . Diabetes mellitus   . History of cocaine abuse   . Hypertension   . Presence of permanent cardiac pacemaker   . Shortness of breath dyspnea   . Suicidal ideation   . Tobacco abuse     Medications:  Infusions:  . sodium chloride Stopped (09/11/16 1000)  . sodium chloride    . sodium chloride 10 mL/hr at 09/15/16 1200  . bivalirudin (ANGIOMAX) infusion 0.5 mg/mL (Non-ACS indications) 0.03 mg/kg/hr (09/15/16 1200)  . lactated ringers Stopped (09/12/16 1300)  . lactated ringers 10 mL/hr (09/10/16 2030)  . milrinone      Assessment: 60 yo male s/p HM3 LVAD 09/09/16.  Coumadin started by TCTS on 12/8, bivalirudin added 12/11 (TCTS managing).   Pharmacy asked to begin managing Coumadin dosing today.  Coumadin dose missed 12/12, and INR falling a bit to 1.44  Goal of Therapy:  INR 2-2.5 Monitor platelets by anticoagulation protocol: Yes   Plan:  1. Coumadin 7.5 mg x 1 tonight. 2. Daily PT/INR. 3. D/c bivalirudin after INR > 1.8 or 2?  Uvaldo Rising, BCPS  Clinical Pharmacist Pager [phone]  09/16/2016 2:16 PM

## 2016-09-16 NOTE — Progress Notes (Signed)
Occupational Therapy Treatment Patient Details Name: Ronald Miller. MRN: [id number] DOB: [birth year] Today's Date: 09/16/2016    History of present illness Patient is a 60 yo male admitted 08/22/16 with acute on chronic systolic HF, with LVEF at 15%. LVAD implantation 09/09/16  PMH:  chronic HF, ICM, CAD, CABG, ICD, HTN, polysubstance abuse, depression, COPD, CKD, DM   OT comments  Pt with poor insight into goals he must accomplish in order to go home. Focus of session on seated grooming, attempts at LB dressing and education in energy conservation. Pt with complaints of pain throughout session. Will continue to follow.  Follow Up Recommendations  Home health OT;Supervision/Assistance - 24 hour    Equipment Recommendations  None recommended by OT (pt states he has all necessary equipment at home)    Recommendations for Other Services      Precautions / Restrictions Precautions Precautions: Sternal;Fall Precaution Comments: LVAD Restrictions Weight Bearing Restrictions: Yes (sternal)       Mobility Bed Mobility               General bed mobility comments: in chair on arrival  Transfers                      Balance     Sitting balance-Leahy Scale: Fair                             ADL Overall ADL's : Needs assistance/impaired     Grooming: Wash/dry hands;Wash/dry face;Sitting;Set up;Brushing hair               Lower Body Dressing: Moderate assistance;Sitting/lateral leans Lower Body Dressing Details (indicate cue type and reason): pt can bend down to feet, but not yet able to cross foot over opposite knee, pt not receptive to AE this visit               General ADL Comments: pt refused to practice switching power sources, educated pt in energy conservation and gave him handout      Vision                     Perception     Praxis      Cognition   Behavior During Therapy: [facility] for tasks  assessed/performed Overall Cognitive Status: Impaired/Different from baseline Area of Impairment: Memory     Memory: Decreased recall of precautions               Extremity/Trunk Assessment               Exercises     Shoulder Instructions       General Comments      Pertinent Vitals/ Pain       Pain Assessment: Faces Faces Pain Scale: Hurts even more Pain Location: chest/sternal area Pain Descriptors / Indicators: Grimacing;Guarding Pain Intervention(s): Repositioned;Monitored during session  Home Living                                          Prior Functioning/Environment              Frequency  Min 3X/week        Progress Toward Goals  OT Goals(current goals can now be found in the care plan section)  Progress towards OT goals: Not progressing toward goals -  comment (pt self limiting)  Acute Rehab OT Goals Patient Stated Goal: get out of here Time For Goal Achievement: 09/27/16 Potential to Achieve Goals: [place] Discharge plan remains appropriate    Co-evaluation                 End of Session     Activity Tolerance Patient limited by pain   Patient Left in chair;with call bell/phone within reach   Nurse Communication          Time: [id number] OT Time Calculation (min): 22 min  Charges: OT General Charges $OT Visit: 1 Procedure OT Treatments $Self Care/Home Management : 8-22 mins  Malka So 09/16/2016, 4:17 PM  [phone]

## 2016-09-16 NOTE — Progress Notes (Signed)
Physical Therapy Treatment Patient Details Name: Ronald Miller. MRN: [id number] DOB: Oct 19, 1955 Today's Date: 09/16/2016    History of Present Illness Patient is a 60 yo male admitted 08/22/16 with acute on chronic systolic HF, with LVEF at 15%. LVAD implantation 09/09/16  PMH:  chronic HF, ICM, CAD, CABG, ICD, HTN, polysubstance abuse, depression, COPD, CKD, DM    PT Comments    Pt relatively pleasant but not eager to mobilize, switch power sources or progress activity. Pt with delayed response to cues for mobility and able to perform power change with min cues but only switched white line to and from battery source with assist to don/doff vest and needed cues for what should be in back up bag. Pt is missing spare set of battery clips. Pt educated for sternal precautions as he was unable to recall any of them and provided handout as well. Pt reaching for chair to pull up on and using elbows to scoot with max cues to prevent these. Will continue to follow. Pt required increased time and encouragement.   Speed 5600 Flow 5.0 PI 2.8-3.0 Power 4.2  Hr 89-109 No SoB   Follow Up Recommendations  Home health PT;Supervision for mobility/OOB     Equipment Recommendations  Rolling walker with 5" wheels    Recommendations for Other Services       Precautions / Restrictions Precautions Precautions: Sternal;Fall Precaution Comments: LVAD Restrictions Weight Bearing Restrictions: Yes (sternal precautions)    Mobility  Bed Mobility               General bed mobility comments: in chair on arrival  Transfers Overall transfer level: Needs assistance   Transfers: Sit to/from Stand Sit to Stand: Min assist         General transfer comment: mod cues for hand placement, precautions, scooting with assist for reciprocal scooting forward and back and assist to power up  Ambulation/Gait Ambulation/Gait assistance: Min assist Ambulation Distance (Feet): 150 Feet Assistive  device: Rolling walker (2 wheeled) Gait Pattern/deviations: Step-through pattern;Decreased stride length;Trunk flexed   Gait velocity interpretation: Below normal speed for age/gender General Gait Details: cues for posture, looking up and decreased weight bearing on bil UE. Pt denied increased distance   Stairs            Wheelchair Mobility    Modified Rankin (Stroke Patients Only)       Balance Overall balance assessment: Needs assistance   Sitting balance-Leahy Scale: Fair       Standing balance-Leahy Scale: Fair                      Cognition Arousal/Alertness: Awake/alert Behavior During Therapy: WFL for tasks assessed/performed Overall Cognitive Status: Impaired/Different from baseline Area of Impairment: Memory     Memory: Decreased recall of precautions              Exercises      General Comments        Pertinent Vitals/Pain Pain Score: 6  Pain Location: chest/sternal area Pain Descriptors / Indicators: Grimacing;Guarding;Sharp;Tender Pain Intervention(s): Limited activity within patient's tolerance;Premedicated before session;Monitored during session;Repositioned    Home Living                      Prior Function            PT Goals (current goals can now be found in the care plan section) Progress towards PT goals: Progressing toward goals    Frequency  PT Plan Current plan remains appropriate    Co-evaluation             End of Session Equipment Utilized During Treatment: Gait belt Activity Tolerance: Patient tolerated treatment well Patient left: in chair;with call bell/phone within reach     Time: 0910-0958 PT Time Calculation (min) (ACUTE ONLY): 48 min  Charges:  $Gait Training: 8-22 mins $Therapeutic Activity: 8-22 mins $Self Care/Home Management: 8-22                    G Codes:      Chesley Valls B Laurance Heide 2016/10/08, 11:01 AM Elwyn Reach, [place]

## 2016-09-16 NOTE — Progress Notes (Signed)
HeartMate 2 Rounding Note  Subjective:    S/P HMII 09/09/2016   Remains on milrinone 0.3. MAPs 80s. Also on bival.   CO-OX pending (initial was 90% repeat 35%)   Complaining of chest discomfort - improved with Lyrica. Denies SOB.   LVAD INTERROGATION:  HeartMate III LVAD:  Flow 5.1 liters/min, speed 5600, power 4.1, PI 2.9.  Occasional PI events. 2 No external power alarms  Objective:    Vital Signs:   Temp:  [97.6 F (36.4 C)-98.3 F (36.8 C)] 97.8 F (36.6 C) (12/14 0432) Pulse Rate:  [32-43] 43 (12/13 0900) Resp:  [18-20] 18 (12/13 1938) BP: (86-110)/(52-91) 110/86 (12/14 0432) SpO2:  [67 %-96 %] 91 % (12/14 0432) Weight:  [227 lb 3.2 oz (103.1 kg)] 227 lb 3.2 oz (103.1 kg) (12/14 0432) Last BM Date: 09/15/16 Mean arterial Pressure 80s   Intake/Output:   Intake/Output Summary (Last 24 hours) at 09/16/16 0713 Last data filed at 09/15/16 2100  Gross per 24 hour  Intake           132.77 ml  Output             1490 ml  Net         -1357.23 ml     Physical Exam: General:  Sitting in bed. NAD.  HEENT: normal Neck: supple. JVP 8-9.  Carotids 2+ bilat; no bruits. No thyromegaly or nodule noted. RIJ swan  Cor: Mechanical heart sounds with LVAD hum present. + CTs Lungs: CTAB, normal effort.  Abdomen: soft, NT, nondistended, no HSM. No bruits or masses. +BS  Driveline: C/D/I; securement device intact and driveline incorporated Extremities: no cyanosis, clubbing, rash, Trace  edema Neuro: Awake and oriented. Moves all 4 extremities on command and without difficulty.    Telemetry: Reviewed, V-paced 70s  Labs: Basic Metabolic Panel:  Recent Labs Lab 09/10/16 0244  09/11/16 0430  09/12/16 0438  09/13/16 0410 09/13/16 1605 09/14/16 0515 09/14/16 1618 09/15/16 0630 09/16/16 0400  NA 138  < > 136  < > 133*  < > 136 134* 134* 134* 138 135  K 4.2  < > 5.0  < > 3.3*  < > 2.7* 3.3* 3.4* 3.7 3.1* 4.6  CL 107  < > 103  < > 95*  < > 91* 86* 90* 87* 104 96*  CO2 21*   --  23  --  28  --  34*  --  35*  --  28 33*  GLUCOSE 171*  < > 179*  < > 119*  < > 82 175* 138* 190* 106* 107*  BUN 11  < > 16  < > 19  < > 21* 23* 19 21* 14 18  CREATININE 1.08  < > 1.31*  < > 1.22  < > 1.12 1.10 0.84 0.90 0.72 0.94  CALCIUM 8.4*  --  8.2*  --  8.2*  --  8.3*  --  8.3*  --  6.6* 8.2*  MG 2.2  < > 2.0  --  1.8  --  1.8  --  1.8  --  1.8 2.2  PHOS 3.0  --  3.8  --  2.7  --   --   --   --   --   --   --   < > = values in this interval not displayed.  Liver Function Tests:  Recent Labs Lab 09/10/16 0244 09/11/16 0430 09/12/16 0438 09/15/16 0630 09/16/16 0400  AST 114* 151* 136* 25 25  ALT  23 55 111* 50 48  ALKPHOS 44 52 55 40 52  BILITOT 2.2* 1.9* 1.5* 0.4 0.5  PROT 5.4* 6.0* 6.0* 4.6* 5.8*  ALBUMIN 3.8 3.5 3.3* 2.1* 2.7*   No results for input(s): LIPASE, AMYLASE in the last 168 hours. No results for input(s): AMMONIA in the last 168 hours.  CBC:  Recent Labs Lab 09/12/16 0438  09/13/16 0410 09/13/16 1605 09/14/16 0515 09/14/16 1618 09/15/16 0400 09/16/16 0400  WBC 11.6*  --  11.5*  --  11.4*  --  8.7 12.0*  NEUTROABS 10.0*  --  9.6*  --  10.0*  --  6.2 9.3*  HGB 8.4*  < > 8.6* 8.8* 9.4* 8.8* 8.6* 9.0*  HCT 24.4*  < > 25.2* 26.0* 28.2* 26.0* 25.5* 27.7*  MCV 91.0  --  91.3  --  92.5  --  92.4 93.9  PLT 62*  --  85*  --  105*  --  138* 206  < > = values in this interval not displayed.  INR:  Recent Labs Lab 09/12/16 0438 09/13/16 0410 09/14/16 0515 09/15/16 0400 09/16/16 0400  INR 1.28 1.24 1.54 1.83 1.44    Other results:    Imaging: Dg Chest Port 1 View  Result Date: 09/15/2016 CLINICAL DATA:  60 year old male status post LVAD. Initial encounter. EXAM: PORTABLE CHEST 1 VIEW COMPARISON:  09/14/2016 and earlier. FINDINGS: Portable AP semi upright view at 0556 hours. Visible LVAD hardware appears stable. Superimposed left chest cardiac AICD. Stable right PICC line. Stable cardiomegaly and mediastinal contours. Visualized tracheal air  column is within normal limits. Continued retrocardiac hypo ventilation, unchanged. No superimposed pneumothorax, pulmonary edema or confluent right lung opacity. Possible small left pleural effusion. IMPRESSION: 1. Stable lungs with retrocardiac hypoventilation. Possible small left pleural effusion, but no pneumothorax or edema. 2. Stable visible LVAD hardware, left chest cardiac AICD and right PICC line. Electronically Signed   By: Genevie Ann M.D.   On: 09/15/2016 07:31     Medications:     Scheduled Medications: . sodium chloride   Intravenous Once  . sodium chloride   Intravenous Once  . amiodarone  400 mg Oral BID  . aspirin EC  325 mg Oral Daily  . atorvastatin  20 mg Oral QHS  . bisacodyl  10 mg Oral Daily  . dextromethorphan-guaiFENesin  1 tablet Oral BID  . digoxin  250 mcg Oral Daily  . docusate sodium  200 mg Oral Daily  . escitalopram  20 mg Oral Daily  . feeding supplement (GLUCERNA SHAKE)  237 mL Oral BID BM  . furosemide  40 mg Intravenous BID  . insulin aspart  0-24 Units Subcutaneous Q4H  . insulin detemir  10 Units Subcutaneous BID  . magnesium oxide  400 mg Oral Daily  . mouth rinse  15 mL Mouth Rinse BID  . multivitamin with minerals  1 tablet Oral Daily  . pantoprazole  40 mg Oral Daily  . potassium chloride  40 mEq Oral BID  . pregabalin  75 mg Oral Daily  . spironolactone  25 mg Oral Daily  . warfarin  5 mg Oral q1800  . Warfarin - Physician Dosing Inpatient   Does not apply q1800    Infusions: . sodium chloride Stopped (09/11/16 1000)  . sodium chloride    . sodium chloride 10 mL/hr at 09/15/16 1200  . bivalirudin (ANGIOMAX) infusion 0.5 mg/mL (Non-ACS indications) 0.03 mg/kg/hr (09/15/16 1200)  . lactated ringers Stopped (09/12/16 1300)  . lactated ringers 10  mL/hr (09/10/16 2030)  . milrinone 0.3 mcg/kg/min (09/15/16 2017)    PRN Medications: sodium chloride, albumin human, albuterol, fentaNYL (SUBLIMAZE) injection, Melatonin, ondansetron (ZOFRAN)  IV, oxyCODONE, senna, sodium chloride flush, sodium chloride flush   Assessment/Plan/Discussion    1. 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  Echo 08/24/16 LVEF 15%, RV mild dilated, moderately reduced.  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 On coumadin INR 1.4 Continue aspirin 325 mg daily until INR low threshold of goal (INR goal 2-2.5)  - Volume status improved.  Continue IV lasix.   - CO-OX pending.  Continue milrinone 0.3 mcg/kg/min for now.  - VAD parameters and renal function stable. LDH ok - Continue to mobilize as tolerated.  2. AKI on CKD III: - Stable. Continue to follow with diuresis.   3. Hypercarbic respiratory failure - Encourage IS. Limit sedation. Diurese.  4. CAD: No CP. 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  5. DMII: Hgb A1C 8.3 on 08/26/2016  - Cover with SSI  6. RLE cellulitis: Completed 8 days of Vancomycin.  7. Tobacco abuse 8. Hypokalemia:  - K 4.6. Cotinue to supp as needed. Continue chronic supp.  - Continue spiro.  9. Atrial fibrillation - back in NSR on amio. Will continue 10. Pocket pain - start Lyrica 11. NSVT- K 4.6   I reviewed the LVAD parameters from today, and compared the results to the patient's prior recorded data.  No programming changes were made.  The LVAD is functioning within specified parameters.  The patient performs LVAD self-test daily.  LVAD interrogation was negative for any significant power changes, alarms or PI events/speed drops.  LVAD equipment check completed and is in good working order.  Back-up equipment present.   LVAD education done on emergency procedures and precautions and reviewed exit site care.  Length of Stay: [place], NP-C  09/16/2016, 7:13 AM  VAD Team --- VAD ISSUES ONLY--- Pager [phone] (7am - 7am)  Advanced Heart Failure Team  Pager [phone] (M-F; 7a - 4p)   Please contact [facility] for night-coverage after hours (4p -7a ) and weekends on [email or web address]  Patient seen and examined with Darrick Grinder, NP. We discussed all aspects of the encounter. I agree with the assessment and plan as stated above.   Continues to improve slowly. Still volume overloaded. Continue IV diuresis. Co-ox inaccurate. Will repeat. Discussed with nurse. Continue milrinone for now. 2 external power alarms yesterday on transfer from 2S to 2W. Reviewed with nursing team. VAD coordinator will educate further. Continue lyrica for pocket pain. Continue bival until INR 1.8. Back in NSR on amio. Can switch to po.  Kerolos Nehme,MD 9:36 AM

## 2016-09-16 NOTE — Progress Notes (Signed)
CARDIAC REHAB PHASE I   PRE:  Rate/Rhythm: 93 paced  BP:  Supine:   Sitting: 82 MAP dopplered  Standing:    SaO2: 90%RA  MODE:  Ambulation: 150 ft   POST:  Rate/Rhythm: 100 paced  BP:  Supine:   Sitting: 96 MAP  dopplererd Standing:    SaO2: 91-92%RA 1435-1530 Pt walked 150 ft on RA with rollator and asst x 2 with slow steady gait. C/o pain left side. Did not have to sit to rest. Back to recliner after walk. Having some difficulty with equipment and needed assistance to switch to batteries. Pt stated he felt a little foggy.    Graylon Good, RN BSN  09/16/2016 3:27 PM

## 2016-09-17 ENCOUNTER — Other Ambulatory Visit (HOSPITAL_COMMUNITY): Payer: Self-pay | Admitting: Internal Medicine

## 2016-09-17 ENCOUNTER — Inpatient Hospital Stay (HOSPITAL_COMMUNITY): Admission: RE | Admit: 2016-09-17 | Payer: Medicare HMO | Source: Ambulatory Visit

## 2016-09-17 ENCOUNTER — Inpatient Hospital Stay (HOSPITAL_COMMUNITY): Payer: Medicare HMO

## 2016-09-17 LAB — COMPREHENSIVE METABOLIC PANEL
ALT: 35 U/L (ref 17–63)
AST: 21 U/L (ref 15–41)
Albumin: 2.5 g/dL — ABNORMAL LOW (ref 3.5–5.0)
Alkaline Phosphatase: 51 U/L (ref 38–126)
Anion gap: 8 (ref 5–15)
BUN: 18 mg/dL (ref 6–20)
CO2: 30 mmol/L (ref 22–32)
Calcium: 8 mg/dL — ABNORMAL LOW (ref 8.9–10.3)
Chloride: 95 mmol/L — ABNORMAL LOW (ref 101–111)
Creatinine, Ser: 0.95 mg/dL (ref 0.61–1.24)
GFR calc Af Amer: >60 mL/min (ref >60)
GFR calc non Af Amer: >60 mL/min (ref >60)
Glucose, Bld: 128 mg/dL — ABNORMAL HIGH (ref 65–99)
Potassium: 4.1 mmol/L (ref 3.5–5.1)
Sodium: 133 mmol/L — ABNORMAL LOW (ref 135–145)
Total Bilirubin: 0.8 mg/dL (ref 0.3–1.2)
Total Protein: 5.8 g/dL — ABNORMAL LOW (ref 6.5–8.1)

## 2016-09-17 LAB — CBC
HCT: 29.1 % — ABNORMAL LOW (ref 39.0–52.0)
Hemoglobin: 9.6 g/dL — ABNORMAL LOW (ref 13.0–17.0)
MCH: 30.9 pg (ref 26.0–34.0)
MCHC: 33 g/dL (ref 30.0–36.0)
MCV: 93.6 fL (ref 78.0–100.0)
Platelets: 240 10*3/uL (ref 150–400)
RBC: 3.11 MIL/uL — ABNORMAL LOW (ref 4.22–5.81)
RDW: 15.1 % (ref 11.5–15.5)
WBC: 14 10*3/uL — ABNORMAL HIGH (ref 4.0–10.5)

## 2016-09-17 LAB — COOXEMETRY PANEL
Carboxyhemoglobin: 1.9 % — ABNORMAL HIGH (ref 0.5–1.5)
Methemoglobin: 0.9 % (ref 0.0–1.5)
O2 Saturation: 51 %
Total hemoglobin: 8.9 g/dL — ABNORMAL LOW (ref 12.0–16.0)

## 2016-09-17 LAB — GLUCOSE, CAPILLARY
GLUCOSE-CAPILLARY: 168 mg/dL — AB (ref 65–99)
Glucose-Capillary: 102 mg/dL — ABNORMAL HIGH (ref 65–99)
Glucose-Capillary: 108 mg/dL — ABNORMAL HIGH (ref 65–99)
Glucose-Capillary: 131 mg/dL — ABNORMAL HIGH (ref 65–99)
Glucose-Capillary: 143 mg/dL — ABNORMAL HIGH (ref 65–99)
Glucose-Capillary: 148 mg/dL — ABNORMAL HIGH (ref 65–99)

## 2016-09-17 LAB — APTT: aPTT: 61 seconds — ABNORMAL HIGH (ref 24–36)

## 2016-09-17 LAB — PROTIME-INR
INR: 1.56
Prothrombin Time: 18.8 seconds — ABNORMAL HIGH (ref 11.4–15.2)

## 2016-09-17 LAB — LACTATE DEHYDROGENASE: LDH: 238 U/L — ABNORMAL HIGH (ref 98–192)

## 2016-09-17 MED ORDER — POTASSIUM CHLORIDE CRYS ER 20 MEQ PO TBCR
40.0000 meq | EXTENDED_RELEASE_TABLET | Freq: Once | ORAL | Status: AC
  Administered 2016-09-17: 40 meq via ORAL
  Filled 2016-09-17: qty 2

## 2016-09-17 MED ORDER — WARFARIN SODIUM 7.5 MG PO TABS
7.5000 mg | ORAL_TABLET | Freq: Once | ORAL | Status: AC
  Administered 2016-09-17: 7.5 mg via ORAL
  Filled 2016-09-17: qty 1

## 2016-09-17 MED ORDER — SORBITOL 70 % PO SOLN
30.0000 mL | Freq: Once | ORAL | Status: AC
  Administered 2016-09-18: 30 mL via ORAL
  Filled 2016-09-17: qty 30

## 2016-09-17 MED ORDER — INSULIN ASPART 100 UNIT/ML ~~LOC~~ SOLN
0.0000 [IU] | Freq: Three times a day (TID) | SUBCUTANEOUS | Status: DC
  Administered 2016-09-17: 2 [IU] via SUBCUTANEOUS
  Administered 2016-09-18 – 2016-09-20 (×9): 3 [IU] via SUBCUTANEOUS
  Administered 2016-09-21: 5 [IU] via SUBCUTANEOUS
  Administered 2016-09-22: 3 [IU] via SUBCUTANEOUS
  Administered 2016-09-22 – 2016-09-23 (×2): 5 [IU] via SUBCUTANEOUS
  Administered 2016-09-23: 2 [IU] via SUBCUTANEOUS
  Administered 2016-09-23 – 2016-09-24 (×2): 3 [IU] via SUBCUTANEOUS
  Administered 2016-09-24: 5 [IU] via SUBCUTANEOUS
  Administered 2016-09-24: 3 [IU] via SUBCUTANEOUS
  Administered 2016-09-25: 8 [IU] via SUBCUTANEOUS
  Administered 2016-09-25: 2 [IU] via SUBCUTANEOUS

## 2016-09-17 MED ORDER — INSULIN ASPART 100 UNIT/ML ~~LOC~~ SOLN
0.0000 [IU] | Freq: Every day | SUBCUTANEOUS | Status: DC
  Administered 2016-09-22: 2 [IU] via SUBCUTANEOUS

## 2016-09-17 NOTE — Progress Notes (Signed)
1024 Have checked with pt twice to walk. Requesting a little more time now before walking. Will follow up after lunch. Graylon Good RN BSN 09/17/2016 10:26 AM

## 2016-09-17 NOTE — Progress Notes (Signed)
ANTICOAGULATION CONSULT NOTE - Follow Up Consult  Pharmacy Consult for Coumadin / bivalirudan Indication: LVAD  Allergies  Allergen Reactions  . Codeine Nausea And Vomiting  . Lipitor [Atorvastatin] Nausea Only    Nausea with high doses, tolerates 20mg  dose (08/22/16)    Patient Measurements: Height: 5\' 9"  (175.3 cm) Weight: 228 lb (103.4 kg) IBW/kg (Calculated) : 70.7 Heparin Dosing Weight: n/a  Vital Signs: Temp: 97.8 F (36.6 C) (12/15 0300) Temp Source: Oral (12/15 0300) BP: 122/89 (12/15 0400) Pulse Rate: 88 (12/15 0800)  Labs:  Recent Labs  09/14/16 1618 09/15/16 0400 09/15/16 0630 09/16/16 0400 09/17/16 0500  HGB 8.8* 8.6*  --  9.0*  --   HCT 26.0* 25.5*  --  27.7*  --   PLT  --  138*  --  206  --   APTT  --  62*  --  66* 61*  LABPROT  --  21.4*  --  17.7* 18.8*  INR  --  1.83  --  1.44 1.56  CREATININE 0.90  --  0.72 0.94 0.95    Estimated Creatinine Clearance: 99.2 mL/min (by C-G formula based on SCr of 0.95 mg/dL).   Medical History: Past Medical History:  Diagnosis Date  . AICD (automatic cardioverter/defibrillator) present   . ASCVD (arteriosclerotic cardiovascular disease)   . Chronic systolic CHF (congestive heart failure) ([place])   . COPD (chronic obstructive pulmonary disease) ([place])   . Coronary artery disease   . Depression   . Diabetes mellitus   . History of cocaine abuse   . Hypertension   . Presence of permanent cardiac pacemaker   . Shortness of breath dyspnea   . Suicidal ideation   . Tobacco abuse     Medications:  Infusions:  . sodium chloride Stopped (09/11/16 1000)  . sodium chloride    . sodium chloride 10 mL/hr at 09/15/16 1200  . bivalirudin (ANGIOMAX) infusion 0.5 mg/mL (Non-ACS indications) 0.03 mg/kg/hr (09/15/16 1200)  . lactated ringers Stopped (09/12/16 1300)  . lactated ringers 10 mL/hr (09/10/16 2030)  . milrinone 0.3 mcg/kg/min (09/17/16 0503)    Assessment: 60 yo male s/p HM3 LVAD 09/09/16.  Coumadin  started by TCTS on 12/8, bivalirudin added 12/11 (TCTS managing).  Pharmacy asked to begin managing Coumadin dosing.  Coumadin dose missed 12/12, and INR fell a bit to 1.44 now starting to bounce back 1.56.  A little blood tinged sputum this am, CBC yesterday ok - will recheck today.  - Keep in mind bivalirudan can falsely elevate INR.   Bivalirudan drip 0.03mg /kg/hr aptt at goal 61sec.  Goal of Therapy:  aptt 50-80 sec INR 2-2.5 Monitor platelets by anticoagulation protocol: Yes   Plan:  1. Repat Coumadin 7.5 mg x 1 tonight. 2. Daily PT/INR. 3. D/c bivalirudin after INR > 2 and recheck INR 4hr after stopped to ensure accurate INR.    Bonnita Nasuti Pharm.D. CPP, BCPS Clinical Pharmacist [phone] 09/17/2016 8:39 AM

## 2016-09-17 NOTE — Progress Notes (Signed)
HeartMate 2 Rounding Note  Subjective:    S/P HMII 09/09/2016   Remains on milrinone 0.3. MAPs 80s. Also on bival.   CO-OX 51%   Denies SOB. Complaining of chest discomfort. Small amount of hemoptysis over night.   LVAD INTERROGATION:  HeartMate III LVAD:  Flow 5.1 liters/min, speed 5600, power 4.1, PI 2.8.  Occasional PI events.  Objective:    Vital Signs:   Temp:  [97.8 F (36.6 C)-98.3 F (36.8 C)] 97.8 F (36.6 C) (12/15 0300) Pulse Rate:  [86-100] 86 (12/15 0300) Resp:  [18] 18 (12/15 0300) BP: (93-122)/(51-89) 122/89 (12/15 0400) SpO2:  [91 %-92 %] 91 % (12/15 0300) Weight:  [228 lb (103.4 kg)] 228 lb (103.4 kg) (12/15 0500) Last BM Date: 09/15/16 Mean arterial Pressure 70s   Intake/Output:   Intake/Output Summary (Last 24 hours) at 09/17/16 0734 Last data filed at 09/16/16 1837  Gross per 24 hour  Intake            700.5 ml  Output             1500 ml  Net           -799.5 ml     Physical Exam: General:  In bed . NAD.  HEENT: normal Neck: supple. JVP 8-9.  Carotids 2+ bilat; no bruits. No thyromegaly or nodule noted. RIJ swan  Cor: Mechanical heart sounds with LVAD hum present. + CTs Lungs: CTAB, normal effort.  Abdomen: soft, NT, nondistended, no HSM. No bruits or masses. +BS  Driveline: C/D/I; securement device intact and driveline incorporated Extremities: no cyanosis, clubbing, rash, Trace  edema Neuro: Awake and oriented. Moves all 4 extremities on command and without difficulty.    Telemetry: Reviewed, V-paced 70s  Labs: Basic Metabolic Panel:  Recent Labs Lab 09/11/16 0430  09/12/16 0438  09/13/16 0410  09/14/16 0515 09/14/16 1618 09/15/16 0630 09/16/16 0400 09/17/16 0500  NA 136  < > 133*  < > 136  < > 134* 134* 138 135 133*  K 5.0  < > 3.3*  < > 2.7*  < > 3.4* 3.7 3.1* 4.6 4.1  CL 103  < > 95*  < > 91*  < > 90* 87* 104 96* 95*  CO2 23  --  28  --  34*  --  35*  --  28 33* 30  GLUCOSE 179*  < > 119*  < > 82  < > 138* 190* 106* 107*  128*  BUN 16  < > 19  < > 21*  < > 19 21* 14 18 18   CREATININE 1.31*  < > 1.22  < > 1.12  < > 0.84 0.90 0.72 0.94 0.95  CALCIUM 8.2*  --  8.2*  --  8.3*  --  8.3*  --  6.6* 8.2* 8.0*  MG 2.0  --  1.8  --  1.8  --  1.8  --  1.8 2.2  --   PHOS 3.8  --  2.7  --   --   --   --   --   --   --   --   < > = values in this interval not displayed.  Liver Function Tests:  Recent Labs Lab 09/11/16 0430 09/12/16 0438 09/15/16 0630 09/16/16 0400 09/17/16 0500  AST 151* 136* 25 25 21   ALT 55 111* 50 48 35  ALKPHOS 52 55 40 52 51  BILITOT 1.9* 1.5* 0.4 0.5 0.8  PROT 6.0* 6.0* 4.6* 5.8*  5.8*  ALBUMIN 3.5 3.3* 2.1* 2.7* 2.5*   No results for input(s): LIPASE, AMYLASE in the last 168 hours. No results for input(s): AMMONIA in the last 168 hours.  CBC:  Recent Labs Lab 09/12/16 0438  09/13/16 0410 09/13/16 1605 09/14/16 0515 09/14/16 1618 09/15/16 0400 09/16/16 0400  WBC 11.6*  --  11.5*  --  11.4*  --  8.7 12.0*  NEUTROABS 10.0*  --  9.6*  --  10.0*  --  6.2 9.3*  HGB 8.4*  < > 8.6* 8.8* 9.4* 8.8* 8.6* 9.0*  HCT 24.4*  < > 25.2* 26.0* 28.2* 26.0* 25.5* 27.7*  MCV 91.0  --  91.3  --  92.5  --  92.4 93.9  PLT 62*  --  85*  --  105*  --  138* 206  < > = values in this interval not displayed.  INR:  Recent Labs Lab 09/13/16 0410 09/14/16 0515 09/15/16 0400 09/16/16 0400 09/17/16 0500  INR 1.24 1.54 1.83 1.44 1.56    Other results:    Imaging: Dg Chest Port 1 View  Result Date: 09/16/2016 CLINICAL DATA:  LVAD, chest soreness EXAM: PORTABLE CHEST 1 VIEW COMPARISON:  09/15/2016 FINDINGS: LVAD, left AICD, right PICC line remain in place, unchanged. There is cardiomegaly. Prior median sternotomy. Left base atelectasis with possible small left effusion. No focal opacity or effusion on the right. IMPRESSION: Left base atelectasis and possible small left effusion. No change since prior study. Electronically Signed   By: Rolm Baptise M.D.   On: 09/16/2016 08:05     Medications:      Scheduled Medications: . sodium chloride   Intravenous Once  . sodium chloride   Intravenous Once  . amiodarone  400 mg Oral BID  . aspirin EC  325 mg Oral Daily  . atorvastatin  20 mg Oral QHS  . bisacodyl  10 mg Oral Daily  . dextromethorphan-guaiFENesin  1 tablet Oral BID  . digoxin  250 mcg Oral Daily  . docusate sodium  200 mg Oral Daily  . escitalopram  20 mg Oral Daily  . feeding supplement (GLUCERNA SHAKE)  237 mL Oral BID BM  . furosemide  40 mg Intravenous BID  . insulin aspart  0-24 Units Subcutaneous Q4H  . insulin detemir  10 Units Subcutaneous BID  . magnesium oxide  400 mg Oral Daily  . mouth rinse  15 mL Mouth Rinse BID  . multivitamin with minerals  1 tablet Oral Daily  . pantoprazole  40 mg Oral Daily  . pregabalin  75 mg Oral Daily  . spironolactone  25 mg Oral Daily  . warfarin   Does not apply Once  . Warfarin - Pharmacist Dosing Inpatient   Does not apply q1800    Infusions: . sodium chloride Stopped (09/11/16 1000)  . sodium chloride    . sodium chloride 10 mL/hr at 09/15/16 1200  . bivalirudin (ANGIOMAX) infusion 0.5 mg/mL (Non-ACS indications) 0.03 mg/kg/hr (09/15/16 1200)  . lactated ringers Stopped (09/12/16 1300)  . lactated ringers 10 mL/hr (09/10/16 2030)  . milrinone 0.3 mcg/kg/min (09/17/16 0503)    PRN Medications: sodium chloride, albumin human, albuterol, fentaNYL (SUBLIMAZE) injection, Melatonin, ondansetron (ZOFRAN) IV, oxyCODONE, senna, sodium chloride flush, sodium chloride flush   Assessment/Plan/Discussion    1. 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  Echo 08/24/16 LVEF 15%, RV mild dilated, moderately reduced.  --> S/P HMIII LVAD 09/09/2016  Post operative thrombocytopenia- Bivalirudin  started 09/13/16. On coumadin INR 1.56 Continue aspirin 325 mg daily until INR low threshold of goal (INR goal 2-2.5)  - Volume status improved.  Continue IV lasix one more day.    -  CO-OX 51%.  Continue milrinone 0.3 mcg/kg/min for now.  - VAD parameters and renal function stable. LDH ok - Continue to mobilize as tolerated.  2. AKI on CKD III: - Stable. Continue to follow with diuresis.   3. Hypercarbic respiratory failure - Improved 4. CAD: No CP. 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  5. DMII: Hgb A1C 8.3 on 08/26/2016  - Cover with SSI  6. RLE cellulitis: Completed 8 days of Vancomycin.  7. Tobacco abuse 8. Hypokalemia:  - K 4.1. Continue to supp as needed. Continue chronic supp.  - Continue spiro.  9. Atrial fibrillation - back in NSR on amio. Will continue 10. Pocket pain - continue Lyrica. Can titrate as needed 11. NSVT- K 4.1  I reviewed the LVAD parameters from today, and compared the results to the patient's prior recorded data.  No programming changes were made.  The LVAD is functioning within specified parameters.  The patient performs LVAD self-test daily.  LVAD interrogation was negative for any significant power changes, alarms or PI events/speed drops.  LVAD equipment check completed and is in good working order.  Back-up equipment present.   LVAD education done on emergency procedures and precautions and reviewed exit site care.  Length of Stay: [place], NP-C  09/17/2016, 7:34 AM  VAD Team --- VAD ISSUES ONLY--- Pager [phone] (7am - 7am)  Advanced Heart Failure Team  Pager [phone] (M-F; 7a - 4p)  Please contact [facility] for night-coverage after hours (4p -7a ) and weekends on [email or web address]  Patient seen and examined with Darrick Grinder, NP. We discussed all aspects of the encounter. I agree with the assessment and plan as stated above.   Progressing well. But co-ox remains marginal. Will recheck co-ox in am if < 55% would increase milrinone to 0.375. Remains volume overloaded. Continue IV lasix. Continue ambulation and teaching. Maintaining NSR on po amio. INR 1.6. Continue bival and  coumadin.  Kalyssa Anker,MD 3:13 PM

## 2016-09-17 NOTE — Clinical Social Work Note (Signed)
CSW met with patient. No supports at bedside. Patient reported that he is doing well and currently has no social work needs. CSW will continue to follow for support.  Dayton Scrape, [place]

## 2016-09-17 NOTE — Progress Notes (Signed)
CARDIAC REHAB PHASE I   PRE:  Rate/Rhythm: pacing 95      MODE:  Ambulation: 130 ft   POST:  Rate/Rhythm: 97 paced    BP: sitting 86 MAP     SaO2: 92 RA  Pt ambulated assist x2 for safety with rollator from education room back to his recliner. Steady, only c/o was pain in his chest. Set up to eat. Pt able to don batteries better today. Mentally more appropriate as well. Will f/u tomorrow. [place], ACSM 09/17/2016 2:58 PM

## 2016-09-17 NOTE — Progress Notes (Signed)
HeartMate 3  Rounding Note Postop day #8 implantation of HeartMate 3  [09-09-16] Subjective:    History of ischemic cardiomyopathy, EF 15% status post CABG 6 2001 History of COPD, substance abuse, AICD placement and moderate RV dysfunction Postoperative coagulopathy and acute blood loss anemia requiring transfusion and blood product administration Patient extubated postop day 1 with stable hemodynamics Patient close to euvolemic still receiving Lasix  INR remains subtherapeutic-IV bivalirudin started because of low postop platelet count and to avoid heparin exposure. PTT remains at target 60-70 seconds  Patient had 3 hours of RVAD equipment education today by the ADA coordinator Patient had bowel movement last night Patient started on amiodarone for atrial fibrillation now in sinus rhythm Co-ox is stable 52%  LVAD INTERROGATION:  HeartMate II LVAD:  Flow 5.2 liters/min, speed 5600 RPM, power 4.1, PI 2.2.  Controller intact and connected  Objective:    Vital Signs:   Temp:  [97.8 F (36.6 C)-98.3 F (36.8 C)] 98.1 F (36.7 C) (12/15 1442) Pulse Rate:  [86-91] 91 (12/15 1442) Resp:  [18] 18 (12/15 0300) BP: (93-122)/(72-89) 122/89 (12/15 0400) SpO2:  [91 %-92 %] 92 % (12/15 1442) Weight:  [228 lb (103.4 kg)] 228 lb (103.4 kg) (12/15 0500) Last BM Date: 09/15/16 Mean arterial Pressure 75 mmHg  Intake/Output:   Intake/Output Summary (Last 24 hours) at 09/17/16 1631 Last data filed at 09/17/16 1538  Gross per 24 hour  Intake           881.82 ml  Output             1350 ml  Net          -468.18 ml     Physical Exam: General:  Well appearing. No resp difficulty. Surgical incisions clean and dry HEENT: normal Neck: supple. No  JVP .Marland Kitchen No lymphadenopathy or thryomegaly appreciated. Cor: Mechanical heart sounds with LVAD hum present. Lungs: clear Abdomen: soft, nontender,  Mildly distended. No hepatosplenomegaly. No bruits or masses. Good bowel sounds. Extremities: no  cyanosis, clubbing, rash, edema Neuro: alert & orientedx3, cranial nerves grossly intact. moves all 4 extremities w/o difficulty. Affect pleasant  Telemetry: Sinus rhythm  Labs: Basic Metabolic Panel:  Recent Labs Lab 09/11/16 0430  09/12/16 0438  09/13/16 0410  09/14/16 0515 09/14/16 1618 09/15/16 0630 09/16/16 0400 09/17/16 0500  NA 136  < > 133*  < > 136  < > 134* 134* 138 135 133*  K 5.0  < > 3.3*  < > 2.7*  < > 3.4* 3.7 3.1* 4.6 4.1  CL 103  < > 95*  < > 91*  < > 90* 87* 104 96* 95*  CO2 23  --  28  --  34*  --  35*  --  28 33* 30  GLUCOSE 179*  < > 119*  < > 82  < > 138* 190* 106* 107* 128*  BUN 16  < > 19  < > 21*  < > 19 21* 14 18 18   CREATININE 1.31*  < > 1.22  < > 1.12  < > 0.84 0.90 0.72 0.94 0.95  CALCIUM 8.2*  --  8.2*  --  8.3*  --  8.3*  --  6.6* 8.2* 8.0*  MG 2.0  --  1.8  --  1.8  --  1.8  --  1.8 2.2  --   PHOS 3.8  --  2.7  --   --   --   --   --   --   --   --   < > =  values in this interval not displayed.  Liver Function Tests:  Recent Labs Lab 09/11/16 0430 09/12/16 0438 09/15/16 0630 09/16/16 0400 09/17/16 0500  AST 151* 136* 25 25 21   ALT 55 111* 50 48 35  ALKPHOS 52 55 40 52 51  BILITOT 1.9* 1.5* 0.4 0.5 0.8  PROT 6.0* 6.0* 4.6* 5.8* 5.8*  ALBUMIN 3.5 3.3* 2.1* 2.7* 2.5*   No results for input(s): LIPASE, AMYLASE in the last 168 hours. No results for input(s): AMMONIA in the last 168 hours.  CBC:  Recent Labs Lab 09/12/16 0438  09/13/16 0410  09/14/16 0515 09/14/16 1618 09/15/16 0400 09/16/16 0400 09/17/16 1041  WBC 11.6*  --  11.5*  --  11.4*  --  8.7 12.0* 14.0*  NEUTROABS 10.0*  --  9.6*  --  10.0*  --  6.2 9.3*  --   HGB 8.4*  < > 8.6*  < > 9.4* 8.8* 8.6* 9.0* 9.6*  HCT 24.4*  < > 25.2*  < > 28.2* 26.0* 25.5* 27.7* 29.1*  MCV 91.0  --  91.3  --  92.5  --  92.4 93.9 93.6  PLT 62*  --  85*  --  105*  --  138* 206 240  < > = values in this interval not displayed.  INR:  Recent Labs Lab 09/13/16 0410 09/14/16 0515  09/15/16 0400 09/16/16 0400 09/17/16 0500  INR 1.24 1.54 1.83 1.44 1.56    Other results:  EKG:   Imaging: Dg Chest Port 1 View  Result Date: 09/16/2016 CLINICAL DATA:  LVAD, chest soreness EXAM: PORTABLE CHEST 1 VIEW COMPARISON:  09/15/2016 FINDINGS: LVAD, left AICD, right PICC line remain in place, unchanged. There is cardiomegaly. Prior median sternotomy. Left base atelectasis with possible small left effusion. No focal opacity or effusion on the right. IMPRESSION: Left base atelectasis and possible small left effusion. No change since prior study. Electronically Signed   By: Rolm Baptise M.D.   On: 09/16/2016 08:05     Medications:     Scheduled Medications: . sodium chloride   Intravenous Once  . sodium chloride   Intravenous Once  . amiodarone  400 mg Oral BID  . aspirin EC  325 mg Oral Daily  . atorvastatin  20 mg Oral QHS  . bisacodyl  10 mg Oral Daily  . dextromethorphan-guaiFENesin  1 tablet Oral BID  . digoxin  250 mcg Oral Daily  . docusate sodium  200 mg Oral Daily  . escitalopram  20 mg Oral Daily  . feeding supplement (GLUCERNA SHAKE)  237 mL Oral BID BM  . furosemide  40 mg Intravenous BID  . insulin aspart  0-15 Units Subcutaneous TID WC  . insulin aspart  0-5 Units Subcutaneous QHS  . insulin detemir  10 Units Subcutaneous BID  . magnesium oxide  400 mg Oral Daily  . mouth rinse  15 mL Mouth Rinse BID  . multivitamin with minerals  1 tablet Oral Daily  . pantoprazole  40 mg Oral Daily  . pregabalin  75 mg Oral Daily  . spironolactone  25 mg Oral Daily  . warfarin  7.5 mg Oral ONCE-1800  . warfarin   Does not apply Once  . Warfarin - Pharmacist Dosing Inpatient   Does not apply q1800    Infusions: . sodium chloride Stopped (09/11/16 1000)  . sodium chloride    . sodium chloride 10 mL/hr at 09/15/16 1200  . bivalirudin (ANGIOMAX) infusion 0.5 mg/mL (Non-ACS indications) 0.03 mg/kg/hr (09/15/16 1200)  .  lactated ringers Stopped (09/12/16 1300)  .  lactated ringers 10 mL/hr (09/10/16 2030)  . 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 We'll stop bivalirudin when INR is > 2.0 Encourage ambulation Oral sorbitol and Dulcolax suppository for abdominal distention when necessary Continue Lasix Continue milrinone 0.3, patient may need prolonged wean as outpatient since he was on it for a few months prior to surgery  I reviewed the LVAD parameters from today, and compared the results to the patient's prior recorded data.  No programming changes were made.  The LVAD is functioning within specified parameters.  The patient performs LVAD self-test daily.  LVAD interrogation was negative for any significant power changes, alarms or PI events/speed drops.  LVAD equipment check completed and is in good working order.  Back-up equipment present.   LVAD education done on emergency procedures and precautions and reviewed exit site care.  Length of Stay: [place] III 09/17/2016, 4:31 PM

## 2016-09-17 NOTE — Progress Notes (Signed)
Patient ambulated on batteries and back up equipment from room 2w22 to waiting room for education with VAD coordinator and family. Ronald Miller, Bettina Gavia RN

## 2016-09-18 LAB — LACTATE DEHYDROGENASE: LDH: 256 U/L — ABNORMAL HIGH (ref 98–192)

## 2016-09-18 LAB — BASIC METABOLIC PANEL
ANION GAP: 7 (ref 5–15)
BUN: 17 mg/dL (ref 6–20)
CALCIUM: 8 mg/dL — AB (ref 8.9–10.3)
CO2: 29 mmol/L (ref 22–32)
Chloride: 95 mmol/L — ABNORMAL LOW (ref 101–111)
Creatinine, Ser: 0.87 mg/dL (ref 0.61–1.24)
Glucose, Bld: 166 mg/dL — ABNORMAL HIGH (ref 65–99)
POTASSIUM: 4.3 mmol/L (ref 3.5–5.1)
SODIUM: 131 mmol/L — AB (ref 135–145)

## 2016-09-18 LAB — COOXEMETRY PANEL
Carboxyhemoglobin: 1.8 % — ABNORMAL HIGH (ref 0.5–1.5)
Methemoglobin: 1 % (ref 0.0–1.5)
O2 Saturation: 53.5 %
Total hemoglobin: 9.9 g/dL — ABNORMAL LOW (ref 12.0–16.0)

## 2016-09-18 LAB — CBC
HEMATOCRIT: 27.2 % — AB (ref 39.0–52.0)
HEMOGLOBIN: 8.9 g/dL — AB (ref 13.0–17.0)
MCH: 30.5 pg (ref 26.0–34.0)
MCHC: 32.7 g/dL (ref 30.0–36.0)
MCV: 93.2 fL (ref 78.0–100.0)
Platelets: 243 10*3/uL (ref 150–400)
RBC: 2.92 MIL/uL — ABNORMAL LOW (ref 4.22–5.81)
RDW: 15.1 % (ref 11.5–15.5)
WBC: 11.5 10*3/uL — AB (ref 4.0–10.5)

## 2016-09-18 LAB — GLUCOSE, CAPILLARY
GLUCOSE-CAPILLARY: 196 mg/dL — AB (ref 65–99)
GLUCOSE-CAPILLARY: 131 mg/dL — AB (ref 65–99)
Glucose-Capillary: 172 mg/dL — ABNORMAL HIGH (ref 65–99)
Glucose-Capillary: 154 mg/dL — ABNORMAL HIGH (ref 65–99)

## 2016-09-18 LAB — PROTIME-INR
INR: 1.74
Prothrombin Time: 20.5 seconds — ABNORMAL HIGH (ref 11.4–15.2)

## 2016-09-18 LAB — APTT: aPTT: 60 seconds — ABNORMAL HIGH (ref 24–36)

## 2016-09-18 MED ORDER — WARFARIN SODIUM 4 MG PO TABS
8.0000 mg | ORAL_TABLET | Freq: Once | ORAL | Status: AC
  Administered 2016-09-18: 8 mg via ORAL
  Filled 2016-09-18: qty 2

## 2016-09-18 MED ORDER — ALTEPLASE 2 MG IJ SOLR
2.0000 mg | Freq: Once | INTRAMUSCULAR | Status: AC
  Administered 2016-09-18: 2 mg

## 2016-09-18 MED ORDER — FUROSEMIDE 10 MG/ML IJ SOLN
60.0000 mg | Freq: Two times a day (BID) | INTRAMUSCULAR | Status: DC
  Administered 2016-09-18 – 2016-09-19 (×3): 60 mg via INTRAVENOUS
  Filled 2016-09-18 (×3): qty 6

## 2016-09-18 NOTE — Progress Notes (Signed)
ANTICOAGULATION CONSULT NOTE - Follow Up Consult  Pharmacy Consult for Coumadin / bivalirudan Indication: LVAD  Allergies  Allergen Reactions  . Codeine Nausea And Vomiting  . Lipitor [Atorvastatin] Nausea Only    Nausea with high doses, tolerates 20mg  dose (08/22/16)    Patient Measurements: Height: 5\' 9"  (175.3 cm) Weight: 229 lb 11.2 oz (104.2 kg) IBW/kg (Calculated) : 70.7 Heparin Dosing Weight: n/a  Vital Signs: Temp: 97.9 F (36.6 C) (12/16 0523) Temp Source: Oral (12/16 0523) BP: 106/82 (12/16 0800) Pulse Rate: 85 (12/16 1019)  Labs:  Recent Labs  09/16/16 0400 09/17/16 0500 09/17/16 1041 09/18/16 0845  HGB 9.0*  --  9.6* 8.9*  HCT 27.7*  --  29.1* 27.2*  PLT 206  --  240 243  APTT 66* 61*  --  60*  LABPROT 17.7* 18.8*  --  20.5*  INR 1.44 1.56  --  1.74  CREATININE 0.94 0.95  --  0.87    Estimated Creatinine Clearance: 108.8 mL/min (by C-G formula based on SCr of 0.87 mg/dL).   Medical History: Past Medical History:  Diagnosis Date  . AICD (automatic cardioverter/defibrillator) present   . ASCVD (arteriosclerotic cardiovascular disease)   . Chronic systolic CHF (congestive heart failure) ([place])   . COPD (chronic obstructive pulmonary disease) ([place])   . Coronary artery disease   . Depression   . Diabetes mellitus   . History of cocaine abuse   . Hypertension   . Presence of permanent cardiac pacemaker   . Shortness of breath dyspnea   . Suicidal ideation   . Tobacco abuse     Medications:  Infusions:  . sodium chloride Stopped (09/11/16 1000)  . sodium chloride    . sodium chloride 10 mL/hr at 09/17/16 2052  . bivalirudin (ANGIOMAX) infusion 0.5 mg/mL (Non-ACS indications) 0.03 mg/kg/hr (09/18/16 0330)  . lactated ringers Stopped (09/12/16 1300)  . lactated ringers 10 mL/hr (09/10/16 2030)  . milrinone 0.375 mcg/kg/min (09/18/16 1023)    Assessment: 60 yo male s/p HM3 LVAD 09/09/16.  Coumadin started by TCTS on 12/8, bivalirudin added  12/11 (TCTS managing). Pharmacy asked to begin managing Coumadin dosing.   Coumadin dose missed 12/12, and INR fell a bit to 1.44 now starting to bounce back 1.74. CBC ok.  - Keep in mind bivalirudan can falsely elevate INR.   Bivalirudan drip 0.03mg /kg/hr aptt at goal 60 sec.  Goal of Therapy:  aptt 50-80 sec INR 2-2.5 Monitor platelets by anticoagulation protocol: Yes   Plan:  1. Repat Coumadin 8 mg x 1 tonight. 2. Daily PT/INR. 3. D/c bivalirudin after INR > 2 and recheck INR 4hr after stopped to ensure accurate INR.    Erin Hearing PharmD., BCPS Clinical Pharmacist Pager [phone] 09/18/2016 10:39 AM

## 2016-09-18 NOTE — Progress Notes (Signed)
CARDIAC REHAB PHASE I   PRE:  Rate/Rhythm: 84 paced  BP:  Supine:   Sitting: MAP 80 Standing:    SaO2: 94% RA  MODE:  Ambulation: 230 ft   POST:  Rate/Rhythm: 100 V-paced  BP:  Supine:   Sitting:   Standing:    SaO2:   1422-1504 Patient tolerated ambulation fair with assist x2 and pushing rollator(then switched to rolling walker). Gait slow, steady, 2 standing rest breaks taken. Assisted pt using urinal and then to bed for dressing change by patient's nurse.   Sol Passer, MS, ACSM CEP

## 2016-09-18 NOTE — Progress Notes (Signed)
Today I worked with Mr. Olm caregiver(s) Karna Christmas and Holley Raring has been trained on percutaneous lead exit site care, care of the driveline and dressing changes. Lyanne Co has performed dressing change under my supervision today. She has not had the opportunity to perform dressing changes during hospitalization as she can only come to hospital . The importance of lead immobilization has been stressed to patient and caregiver(s) using two attachment device. Discussed appropriate placement for HM3 modular driveline. Also discussed safety check in assessment of modular cable to ensure it is in the locked position. They were able to verify that they should 'not see yellow' in the connection.   The caregiver has demonstrated the following:  1. Cleansing site with sterile technique >> discussed with Amy concerns regarding need for additional short term home support for reinforcing sterile technique as she had a very difficult time. Have requested to have Terri and Glenda perform dressing changes together for about a week to ensure that they grasp understanding or procedure and importance  2. Dressing care and maintenance  3. Immobilizing driveline    Gunnar Fusi. was again provided reinforcement and education about his system controller and reinforced proper emergency procedure. Reinforced he should never remove driveline from controller without his caregiver and VAD Coordinator knowledge. He demonstrated improved knowledge of the [facility] LVAS today. Encouraged to fill out VAD Log Book again today to get used to parameters. Discussed importance of daily weights performing the same way, same time on same scale - whether he weighs with equipment or not should be noted.   They completed at this time controller exchanges, the HM 3 patient/caregiver home test as well as reviewed in detail the home assessment checklist. Successfully paged the VAD Pager and provided instructions on appropriate utilization of the pager.    Total Elapsed Time: 3 hours  [name], RN VAD Coordinator   Office: [phone] 24/7 Emergency VAD Pager: [phone]

## 2016-09-18 NOTE — Progress Notes (Signed)
Patient's automatic pressure was taken at 4 am 70/51 (57). Unable to get doppler pressure at this time. Laid patient down in the bed and was able to get automatic b/p of 107/89 (96). Automatic pressure was taken again 104/92 (98). Doppler was able to be obtained and was 94. Patient asymptomatic. Will continue to monitor. See flow sheets

## 2016-09-18 NOTE — Progress Notes (Signed)
VAD Discharge Teaching Note:  Discharge VAD teaching completed with Gunnar Fusi. and his caregiver support team:  [name]               [phone]  [name]                Mother             [phone]  The home inspection checklist has been reviewed and no unsafe conditions have been identified. Specifically the family reports that there are at least two dedicated grounded, 3-prong outlets with clearly labeled circuit breaker has been established in the bedroom for Charity fundraiser and MPU, running water is in the home as well as a reliable telephone.   Both patient and caregivers have been trained on the following:  1. HeartMate 3 LVAD overview of system operations  2. Overview of major lifestyle accommodations and cautions   3. Overview of system components (features and functions) 4. Changing power sources 5. Overview of alerts and alarms 6. How to identify and manage an emergency including when pump is running and when pump has stopped  7. Changing system controller 8. Maintain emergency contact list and medications  The patient and patent's caregivers have successfully demonstrated:   1. Changing power source (from batteries to MPU/PM, MPU/PM to batteries and replacing batteries) 2. Perform system controller self test  3. Check and charge batteries  4. Identify routine maintanence for MPU/UBC/batteries.  5. Change system controller**  The patient needed very frequent instruction and guidance to perform this task. He was not able to complete this in a reasonable time frame. Strongly reinforced he should NEVER perform this task alone. Needs continual reinforcement.  6. Paged VAD pager and programmed number in phones  A daily flow sheet with patient  weight, temperature,  flow, speed, power, and PI, along with daily self checks on system controller have been performed by patient and caregiver(s) during hospitalization and will also be done  daily at home.   The following routine activities and maintenance have been reviewed with patient and caregiver(s) and both verbalize understanding:  1. Stressed importance of never disconnecting power from both controller power leads at the same time, and never disconnecting both batteries at the same time.  2. Plug the Mobile Power Unit (MPU) and the universal Charity fundraiser (UBC) into properly grounded (3 prong) outlets dedicated to MPU/UBC use. Do NOT use adapter (cheater plug) for ungrounded outlets or multiple portable socket outlets (power strips). 3. Do not connect the MPU or UBC to an outlet controlled by wall switch or the device may not work 4. The MPU AA batteries that provide power to the speaker and indicator lights should be changed every 6 months.  6. Transfer from MPU to batteries during [facility] power failure. The system controller will provide 10mof power to run the pump at set speed.  7. Keep a backup system controller, charged batteries, battery clips, and flashlight near you during sleep in case of electrical power outage 8. Clean battery, battery clip, and universal battery charger contacts weekly 9. Visually inspect percutaneous lead daily - report any tearing, splitting or damage.  10. Check cables and connectors when changing power source  11. Rotate batteries weekly; keep all eight batteries charged. Specific emphasis to remember the emergency back up batteries.  12. Always have backup system controller, battery clips, fully charged batteries, and spare fully charged batteries when traveling. Always notify your VAD Team of travel >3 hours distance away.  13. Re-calibrate batteries every 70 uses; recharge back up system controller every 6 months, monitor battery life of 36 months or 360 uses; replace batteries at end of battery life   Identified the following changes in activities of daily living with pump:  1. No driving for at least six weeks and then only if doctor gives  permission to do so 2. No tub baths while pump implanted, and shower only if doctor gives permission with the manufacturer shower equipment.  3. No swimming or submersion in water while implanted with pump 4. Keep all VAD equipment away from water or moisture 5. Keep all VAD connections clean and dry 6. No contact sports or engage in jumping activities 7. Avoid strong static electricity (touching TV/computer screens, vacuuming) 8. Never have an MRI while implanted with the pump 9. Never leave or store batteries in extremely hot or cold places (such as   trunk of your car), or the battery life will be shortened 10. Call the doctor or hospital contact person if any change in how the pump sounds, feels, or works 11. Plan to sleep only when connected to the MPU. 12. Keep a backup system controller, charged batteries, battery clips, and flashlight near you during sleep in case of electrical power outage 13. Do not sleep on your stomach and avoid laying on driveline exit site, especially while it is healing.  14. Talk with doctor/coordinator before any long distance travel plans 15. Patient will need antibiotics prior to any dental procedure; instructed to contact VAD coordinator before any dental procedures (including routine cleaning) 16. Paitent will maintain appointments with PCP.    Discharge binder given to patient and include the following:  1. Discharge instructions - [place] VAD Patient Education Binder 2. List of emergency contacts 3. Wallet card 4. HM 3 VAD Luggage tags 5. Guide to Percutaneous Lead Care and Equipment Maintenance 6. HM 3 Alarms for Patients and their Caregivers 7. HM 3 Patient Handbook 8. HM 3 Information and Emergency Assistance Guide 9. HM 3 MPU Alarms, Care & Maintenance 11. Daily diary sheets 12. Care of the Percutaneous Lead  Discharge equipment includes:  1. Two system controllers 2. One MPU with 9' patient cable 3. One universal Charity fundraiser  (UBC) 4. Eight fully charged 14V Li-Ion batteries  5. Four battery clips 6. One travel case 7. Medium Holster Vest 8. Wearable accessory kit 9. Shower bags (2) 10. 20 dressings and 5 anchors  The following notification process will be completed with:  [place]  [facility],  PCP   Warfarin / ASA Education:  Discussed frequency and importance of INR checks and taking coumadin/ASA as prescribed by the doctor; emphasized importance of maintaining INR goal to prevent clotting and/or bleeding issues with pump. He has never been on warfarin in the past and will meet more with coordinators and pharm-D on outpatient basis. He has received inpatient coumadin education and has been educated on diet and importance to inform us about medication changes made outside VAD clinic. Reinforced that he should ONLY take instructions regarding his coumadin from our VAD team and we need to be involved in any elective procedures that require warfarin hold.   Able to answer questions and asked good questions pertaining to warfarin and diet/lifestyle changes necessary to  be successful and safe.  Patient will have INR managed with the assistance of Home Health RN ([place]) while on home milrinone; current INR goal is 2.0 - 2.5.    Controller Change Outs: 1. Patient should have a caregiver present during system controller exchange or call 911 and VAD pager before attempting to change controller if alone. 2. As part of the weekly safety checklist, the patient and caregiver should review and understand the steps and instructions involved with replacing the system controller.  3. As part of monthly safety checklist, the patient and caregiver should review and understand the system controller alarms and how to resolve them.  4. Updated copy of HM 3 Guide to Replacing the [place] with the [place] for Patients and Their  Caregiver(s) was given and reviewed.  5. HM 3 Home Flowsheets given that include the weekly and monthly checks as noted above.  The patient has completed a proficiency test for the HM 3 and all questions have been answered. The pt and family have been instructed to call if any questions, problems, or concerns arise. Pt and caregiver successfully paged VAD coordinator using VAD pager emergency number and have been instructed to use this number only for emergencies. Caregiver(s) asked appropriate questions, had good interaction with VAD coordinator, and verbalized understanding of above instructions. Patient was sleepy due to pain medication needs at times during education. Follow up will be made Monday to validate evidence of learning.   Total elapsed time: 4 hours   [name], RN VAD Coordinator  Office: [phone] 24/7 [place] Pager: [phone]

## 2016-09-18 NOTE — Progress Notes (Signed)
Patient ID: Ronald Miller., male   DOB: [birth year], 60 y.o.   MRN: [id number]  HeartMate 2 Rounding Note  Subjective:    S/P HMII 09/09/2016   Remains on milrinone 0.3. MAPs 80s-90s. Also on bivalirudin.   CO-OX 53.5%   Denies SOB. Still with some surgical site pain.   LVAD INTERROGATION:  HeartMate III LVAD:  Flow 5 liters/min, speed 5600, power 4.3, PI 2.7.  Occasional PI events.  Objective:    Vital Signs:   Temp:  [97.9 F (36.6 C)-98.7 F (37.1 C)] 97.9 F (36.6 C) (12/16 0523) Pulse Rate:  [91] 91 (12/15 2032) Resp:  [20] 20 (12/16 0523) BP: (70-107)/(51-92) 106/82 (12/16 0800) SpO2:  [92 %-94 %] 94 % (12/16 0523) Weight:  [229 lb 11.2 oz (104.2 kg)] 229 lb 11.2 oz (104.2 kg) (12/16 0523) Last BM Date: 09/15/16 Mean arterial Pressure 80s-90s  Intake/Output:   Intake/Output Summary (Last 24 hours) at 09/18/16 0930 Last data filed at 09/18/16 0622  Gross per 24 hour  Intake           860.35 ml  Output              800 ml  Net            60.35 ml     Physical Exam: General:  In bed . NAD.  HEENT: normal Neck: supple. JVP 8-9.  Carotids 2+ bilat; no bruits. No thyromegaly or nodule noted. RIJ swan  Cor: Mechanical heart sounds with LVAD hum present. + CTs Lungs: CTAB, normal effort.  Abdomen: soft, NT, nondistended, no HSM. No bruits or masses. +BS  Driveline: C/D/I; securement device intact and driveline incorporated Extremities: no cyanosis, clubbing, rash, Trace  edema Neuro: Awake and oriented. Moves all 4 extremities on command and without difficulty.    Telemetry: Reviewed, V-paced 70s  Labs: Basic Metabolic Panel:  Recent Labs Lab 09/12/16 0438  09/13/16 0410  09/14/16 0515 09/14/16 1618 09/15/16 0630 09/16/16 0400 09/17/16 0500  NA 133*  < > 136  < > 134* 134* 138 135 133*  K 3.3*  < > 2.7*  < > 3.4* 3.7 3.1* 4.6 4.1  CL 95*  < > 91*  < > 90* 87* 104 96* 95*  CO2 28  --  34*  --  35*  --  28 33* 30  GLUCOSE 119*  < > 82  < > 138* 190*  106* 107* 128*  BUN 19  < > 21*  < > 19 21* 14 18 18   CREATININE 1.22  < > 1.12  < > 0.84 0.90 0.72 0.94 0.95  CALCIUM 8.2*  --  8.3*  --  8.3*  --  6.6* 8.2* 8.0*  MG 1.8  --  1.8  --  1.8  --  1.8 2.2  --   PHOS 2.7  --   --   --   --   --   --   --   --   < > = values in this interval not displayed.  Liver Function Tests:  Recent Labs Lab 09/12/16 0438 09/15/16 0630 09/16/16 0400 09/17/16 0500  AST 136* 25 25 21   ALT 111* 50 48 35  ALKPHOS 55 40 52 51  BILITOT 1.5* 0.4 0.5 0.8  PROT 6.0* 4.6* 5.8* 5.8*  ALBUMIN 3.3* 2.1* 2.7* 2.5*   No results for input(s): LIPASE, AMYLASE in the last 168 hours. No results for input(s): AMMONIA in the last 168 hours.  CBC:  Recent Labs Lab 09/12/16 0438  09/13/16 0410  09/14/16 0515 09/14/16 1618 09/15/16 0400 09/16/16 0400 09/17/16 1041 09/18/16 0845  WBC 11.6*  --  11.5*  --  11.4*  --  8.7 12.0* 14.0* 11.5*  NEUTROABS 10.0*  --  9.6*  --  10.0*  --  6.2 9.3*  --   --   HGB 8.4*  < > 8.6*  < > 9.4* 8.8* 8.6* 9.0* 9.6* 8.9*  HCT 24.4*  < > 25.2*  < > 28.2* 26.0* 25.5* 27.7* 29.1* 27.2*  MCV 91.0  --  91.3  --  92.5  --  92.4 93.9 93.6 93.2  PLT 62*  --  85*  --  105*  --  138* 206 240 243  < > = values in this interval not displayed.  INR:  Recent Labs Lab 09/14/16 0515 09/15/16 0400 09/16/16 0400 09/17/16 0500 09/18/16 0845  INR 1.54 1.83 1.44 1.56 1.74    Other results:    Imaging: Dg Chest 2 View  Result Date: 09/17/2016 CLINICAL DATA:  LVAD.  Chronic systolic congestive heart failure. EXAM: CHEST  2 VIEW COMPARISON:  09/16/2016 FINDINGS: Support apparatus is stable, including LVID, dual lead cardiac pacemaker and right-sided PICC line. Post sternotomy changes are stable. The cardiac silhouette is stably enlarged. Mediastinal contours appear intact. Calcific atherosclerotic disease of the aorta is noted. There are small bilateral pleural effusions. There is mild interstitial pulmonary edema. Focal airspace  opacities seen of the left lower lobe. Osseous structures are without acute abnormality. Soft tissues are grossly normal. IMPRESSION: Stable enlargement of the cardiac silhouette. Small bilateral pleural effusions with interstitial pulmonary edema. Focal airspace consolidation in the left lower lobe, slightly worsened. Electronically Signed   By: Fidela Salisbury M.D.   On: 09/17/2016 16:45     Medications:     Scheduled Medications: . sodium chloride   Intravenous Once  . sodium chloride   Intravenous Once  . amiodarone  400 mg Oral BID  . aspirin EC  325 mg Oral Daily  . atorvastatin  20 mg Oral QHS  . bisacodyl  10 mg Oral Daily  . dextromethorphan-guaiFENesin  1 tablet Oral BID  . digoxin  250 mcg Oral Daily  . docusate sodium  200 mg Oral Daily  . escitalopram  20 mg Oral Daily  . feeding supplement (GLUCERNA SHAKE)  237 mL Oral BID BM  . furosemide  60 mg Intravenous BID  . insulin aspart  0-15 Units Subcutaneous TID WC  . insulin aspart  0-5 Units Subcutaneous QHS  . insulin detemir  10 Units Subcutaneous BID  . magnesium oxide  400 mg Oral Daily  . mouth rinse  15 mL Mouth Rinse BID  . multivitamin with minerals  1 tablet Oral Daily  . pantoprazole  40 mg Oral Daily  . pregabalin  75 mg Oral Daily  . sorbitol  30 mL Oral Once  . spironolactone  25 mg Oral Daily  . warfarin   Does not apply Once  . Warfarin - Pharmacist Dosing Inpatient   Does not apply q1800    Infusions: . sodium chloride Stopped (09/11/16 1000)  . sodium chloride    . sodium chloride 10 mL/hr at 09/17/16 2052  . bivalirudin (ANGIOMAX) infusion 0.5 mg/mL (Non-ACS indications) 0.03 mg/kg/hr (09/18/16 0330)  . lactated ringers Stopped (09/12/16 1300)  . lactated ringers 10 mL/hr (09/10/16 2030)  . milrinone 0.3 mcg/kg/min (09/17/16 2358)    PRN Medications: sodium chloride, albumin human, albuterol,  fentaNYL (SUBLIMAZE) injection, Melatonin, ondansetron (ZOFRAN) IV, oxyCODONE, senna, sodium  chloride flush, sodium chloride flush   Assessment/Plan/Discussion    1. 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  Echo 08/24/16 LVEF 15%, RV mild dilated, moderately reduced.  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 On coumadin INR 1.74. Continue aspirin 325 mg daily until INR low threshold of goal (INR goal 2-2.5)  - Still some volume overload on exam, did not diurese much yesterday and weight up a pound, will increase Lasix to 60 mg IV bid today.   - Still pending BMET and LDH.  - CO-OX 53.5%.  Increase milrinone to 0.375.   - VAD parameters.  - Continue to mobilize as tolerated.  2. AKI on CKD III: - Stable. Continue to follow with diuresis.   3. Hypercarbic respiratory failure - Improved 4. CAD: No CP. 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  5. DMII: Hgb A1C 8.3 on 08/26/2016  - Cover with SSI  6. RLE cellulitis: Completed 8 days of Vancomycin.  7. Tobacco abuse 8. Hypokalemia:  - Continue to supp as needed.   - Continue spiro.  9. Atrial fibrillation - back in NSR on amio. Will continue 10. Pocket pain - continue Lyrica. Can titrate as needed  I reviewed the LVAD parameters from today, and compared the results to the patient's prior recorded data.  No programming changes were made.  The LVAD is functioning within specified parameters.  The patient performs LVAD self-test daily.  LVAD interrogation was negative for any significant power changes, alarms or PI events/speed drops.  LVAD equipment check completed and is in good working order.  Back-up equipment present.   LVAD education done on emergency procedures and precautions and reviewed exit site care.  Length of Stay: [address],  09/18/2016, 9:30 AM  VAD Team --- VAD ISSUES ONLY--- Pager [phone] (7am - 7am)  Advanced Heart Failure Team  Pager [phone] (M-F; 7a - 4p)    Please contact [facility] for night-coverage after hours (4p -7a ) and weekends on [email or web address]

## 2016-09-19 LAB — GLUCOSE, CAPILLARY
GLUCOSE-CAPILLARY: 187 mg/dL — AB (ref 65–99)
Glucose-Capillary: 185 mg/dL — ABNORMAL HIGH (ref 65–99)
Glucose-Capillary: 193 mg/dL — ABNORMAL HIGH (ref 65–99)
Glucose-Capillary: 181 mg/dL — ABNORMAL HIGH (ref 65–99)

## 2016-09-19 LAB — BASIC METABOLIC PANEL
Anion gap: 10 (ref 5–15)
BUN: 16 mg/dL (ref 6–20)
CALCIUM: 8.3 mg/dL — AB (ref 8.9–10.3)
CO2: 28 mmol/L (ref 22–32)
Chloride: 92 mmol/L — ABNORMAL LOW (ref 101–111)
Creatinine, Ser: 0.85 mg/dL (ref 0.61–1.24)
GFR calc Af Amer: >60 mL/min (ref >60)
GLUCOSE: 192 mg/dL — AB (ref 65–99)
POTASSIUM: 3.9 mmol/L (ref 3.5–5.1)
Sodium: 130 mmol/L — ABNORMAL LOW (ref 135–145)

## 2016-09-19 LAB — COOXEMETRY PANEL
Carboxyhemoglobin: 1.7 % — ABNORMAL HIGH (ref 0.5–1.5)
Carboxyhemoglobin: 1.8 % — ABNORMAL HIGH (ref 0.5–1.5)
METHEMOGLOBIN: 0.9 % (ref 0.0–1.5)
Methemoglobin: 0.9 % (ref 0.0–1.5)
O2 Saturation: 44.5 %
O2 Saturation: 52.6 %
TOTAL HEMOGLOBIN: 11.3 g/dL — AB (ref 12.0–16.0)
Total hemoglobin: 10.6 g/dL — ABNORMAL LOW (ref 12.0–16.0)

## 2016-09-19 LAB — CBC
HEMATOCRIT: 27 % — AB (ref 39.0–52.0)
Hemoglobin: 8.8 g/dL — ABNORMAL LOW (ref 13.0–17.0)
MCH: 30.1 pg (ref 26.0–34.0)
MCHC: 32.6 g/dL (ref 30.0–36.0)
MCV: 92.5 fL (ref 78.0–100.0)
Platelets: 244 10*3/uL (ref 150–400)
RBC: 2.92 MIL/uL — ABNORMAL LOW (ref 4.22–5.81)
RDW: 15 % (ref 11.5–15.5)
WBC: 12.4 10*3/uL — ABNORMAL HIGH (ref 4.0–10.5)

## 2016-09-19 LAB — PROTIME-INR
INR: 1.66
Prothrombin Time: 19.8 seconds — ABNORMAL HIGH (ref 11.4–15.2)

## 2016-09-19 LAB — LACTATE DEHYDROGENASE: LDH: 245 U/L — ABNORMAL HIGH (ref 98–192)

## 2016-09-19 LAB — APTT: aPTT: 62 seconds — ABNORMAL HIGH (ref 24–36)

## 2016-09-19 MED ORDER — WARFARIN SODIUM 10 MG PO TABS
10.0000 mg | ORAL_TABLET | Freq: Once | ORAL | Status: AC
  Administered 2016-09-19: 10 mg via ORAL
  Filled 2016-09-19: qty 1

## 2016-09-19 NOTE — Progress Notes (Signed)
Patient's sister and mother came to be educated on how to do a dressing change. They both stated that they understood the sterile technique and would be willing to try tomorrow themselves. The plan is for them to come in at 2pm and the mother will do the daily dressing change with the RN's guidance.

## 2016-09-19 NOTE — Progress Notes (Signed)
Patient ID: Ronald Brandow., male   DOB: [birth year], 60 y.o.   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 MAPs 80s. Also on bivalirudin.   CO-OX 44%   Denies SOB. Still with some surgical site pain. Fatigued with walks.   LVAD INTERROGATION:  HeartMate III LVAD:  Flow 5.1 liters/min, speed 5600, power 4.1, PI 2.4.    Objective:    Vital Signs:   Temp:  [98.2 F (36.8 C)-98.3 F (36.8 C)] 98.3 F (36.8 C) (12/17 0400) Pulse Rate:  [84-85] 84 (12/17 0400) Resp:  [20] 20 (12/17 0400) BP: (86-105)/(66-84) 104/77 (12/17 0800) SpO2:  [91 %-93 %] 93 % (12/17 0400) Weight:  [229 lb 8 oz (104.1 kg)] 229 lb 8 oz (104.1 kg) (12/17 0500) Last BM Date: 09/15/16 Mean arterial Pressure 80s  Intake/Output:   Intake/Output Summary (Last 24 hours) at 09/19/16 0945 Last data filed at 09/19/16 0800  Gross per 24 hour  Intake           367.55 ml  Output             2925 ml  Net         -2557.45 ml     Physical Exam: General:  In bed . NAD.  HEENT: normal Neck: supple. JVP 7.  Carotids 2+ bilat; no bruits. No thyromegaly or nodule noted. RIJ swan  Cor: Mechanical heart sounds with LVAD hum present. + CTs Lungs: CTAB, normal effort.  Abdomen: soft, NT, nondistended, no HSM. No bruits or masses. +BS  Driveline: C/D/I; securement device intact and driveline incorporated Extremities: no cyanosis, clubbing, rash, Trace  edema Neuro: Awake and oriented. Moves all 4 extremities on command and without difficulty.    Telemetry: Reviewed, V-paced 70s  Labs: Basic Metabolic Panel:  Recent Labs Lab 09/13/16 0410  09/14/16 0515  09/15/16 0630 09/16/16 0400 09/17/16 0500 09/18/16 0845 09/19/16 0353  NA 136  < > 134*  < > 138 135 133* 131* 130*  K 2.7*  < > 3.4*  < > 3.1* 4.6 4.1 4.3 3.9  CL 91*  < > 90*  < > 104 96* 95* 95* 92*  CO2 34*  --  35*  --  28 33* 30 29 28   GLUCOSE 82  < > 138*  < > 106* 107* 128* 166*  192*  BUN 21*  < > 19  < > 14 18 18 17 16   CREATININE 1.12  < > 0.84  < > 0.72 0.94 0.95 0.87 0.85  CALCIUM 8.3*  --  8.3*  --  6.6* 8.2* 8.0* 8.0* 8.3*  MG 1.8  --  1.8  --  1.8 2.2  --   --   --   < > = values in this interval not displayed.  Liver Function Tests:  Recent Labs Lab 09/15/16 0630 09/16/16 0400 09/17/16 0500  AST 25 25 21   ALT 50 48 35  ALKPHOS 40 52 51  BILITOT 0.4 0.5 0.8  PROT 4.6* 5.8* 5.8*  ALBUMIN 2.1* 2.7* 2.5*   No results for input(s): LIPASE, AMYLASE in the last 168 hours. No results for input(s): AMMONIA in the last 168 hours.  CBC:  Recent Labs Lab 09/13/16 0410  09/14/16 0515  09/15/16 0400 09/16/16 0400 09/17/16 1041 09/18/16 0845 09/19/16 0353  WBC 11.5*  --  11.4*  --  8.7 12.0* 14.0* 11.5* 12.4*  NEUTROABS 9.6*  --  10.0*  --  6.2 9.3*  --   --   --   HGB 8.6*  < > 9.4*  < > 8.6* 9.0* 9.6* 8.9* 8.8*  HCT 25.2*  < > 28.2*  < > 25.5* 27.7* 29.1* 27.2* 27.0*  MCV 91.3  --  92.5  --  92.4 93.9 93.6 93.2 92.5  PLT 85*  --  105*  --  138* 206 240 243 244  < > = values in this interval not displayed.  INR:  Recent Labs Lab 09/15/16 0400 09/16/16 0400 09/17/16 0500 09/18/16 0845 09/19/16 0353  INR 1.83 1.44 1.56 1.74 1.66    Other results:    Imaging: Dg Chest 2 View  Result Date: 09/17/2016 CLINICAL DATA:  LVAD.  Chronic systolic congestive heart failure. EXAM: CHEST  2 VIEW COMPARISON:  09/16/2016 FINDINGS: Support apparatus is stable, including LVID, dual lead cardiac pacemaker and right-sided PICC line. Post sternotomy changes are stable. The cardiac silhouette is stably enlarged. Mediastinal contours appear intact. Calcific atherosclerotic disease of the aorta is noted. There are small bilateral pleural effusions. There is mild interstitial pulmonary edema. Focal airspace opacities seen of the left lower lobe. Osseous structures are without acute abnormality. Soft tissues are grossly normal. IMPRESSION: Stable enlargement of  the cardiac silhouette. Small bilateral pleural effusions with interstitial pulmonary edema. Focal airspace consolidation in the left lower lobe, slightly worsened. Electronically Signed   By: Fidela Salisbury M.D.   On: 09/17/2016 16:45     Medications:     Scheduled Medications: . sodium chloride   Intravenous Once  . sodium chloride   Intravenous Once  . amiodarone  400 mg Oral BID  . aspirin EC  325 mg Oral Daily  . atorvastatin  20 mg Oral QHS  . bisacodyl  10 mg Oral Daily  . dextromethorphan-guaiFENesin  1 tablet Oral BID  . digoxin  250 mcg Oral Daily  . docusate sodium  200 mg Oral Daily  . escitalopram  20 mg Oral Daily  . feeding supplement (GLUCERNA SHAKE)  237 mL Oral BID BM  . furosemide  60 mg Intravenous BID  . insulin aspart  0-15 Units Subcutaneous TID WC  . insulin aspart  0-5 Units Subcutaneous QHS  . insulin detemir  10 Units Subcutaneous BID  . magnesium oxide  400 mg Oral Daily  . mouth rinse  15 mL Mouth Rinse BID  . multivitamin with minerals  1 tablet Oral Daily  . pantoprazole  40 mg Oral Daily  . pregabalin  75 mg Oral Daily  . spironolactone  25 mg Oral Daily  . warfarin   Does not apply Once  . Warfarin - Pharmacist Dosing Inpatient   Does not apply q1800    Infusions: . sodium chloride Stopped (09/11/16 1000)  . sodium chloride    . sodium chloride 10 mL/hr at 09/17/16 2052  . bivalirudin (ANGIOMAX) infusion 0.5 mg/mL (Non-ACS indications) 0.03 mg/kg/hr (09/19/16 0350)  . lactated ringers Stopped (09/12/16 1300)  . lactated ringers 10 mL/hr (09/10/16 2030)  . milrinone 0.375 mcg/kg/min (09/19/16 0350)    PRN Medications: sodium chloride, albumin human, albuterol, fentaNYL (SUBLIMAZE) injection, Melatonin, ondansetron (ZOFRAN) IV, oxyCODONE, senna, sodium chloride flush, sodium chloride flush   Assessment/Plan/Discussion    1. 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  Echo 08/24/16  LVEF 15%, RV mild dilated, moderately reduced.  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 On  coumadin INR 1.66. Continue aspirin 325 mg daily until INR low threshold of goal (INR goal 2-2.5)  - Diuresed well yesterday, will hold IV Lasix after this morning, likely start po tomorrow.    - CO-OX 44% despite increase in milrinone to 0.375.  Repeat co-ox now as done early in am, may need to increase again though would like to avoid.  - VAD parameters stable.  - Continue to mobilize as tolerated.  2. AKI on CKD III: - Stable.   3. Hypercarbic respiratory failure - Improved 4. CAD: No CP. 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  5. DMII: Hgb A1C 8.3 on 08/26/2016  - Cover with SSI  6. RLE cellulitis: Completed 8 days of Vancomycin.  7. Tobacco abuse 8. Hypokalemia:  - Continue to supp as needed.   - Continue spiro.  9. Atrial fibrillation - back in NSR on amio. Will continue 10. Pocket pain - continue Lyrica. Can titrate as needed  I reviewed the LVAD parameters from today, and compared the results to the patient's prior recorded data.  No programming changes were made.  The LVAD is functioning within specified parameters.  The patient performs LVAD self-test daily.  LVAD interrogation was negative for any significant power changes, alarms or PI events/speed drops.  LVAD equipment check completed and is in good working order.  Back-up equipment present.   LVAD education done on emergency procedures and precautions and reviewed exit site care.  Length of Stay: [address],  09/19/2016, 9:45 AM  VAD Team --- VAD ISSUES ONLY--- Pager [phone] (7am - 7am)  Advanced Heart Failure Team  Pager [phone] (M-F; 7a - 4p)  Please contact [facility] for night-coverage after hours (4p -7a ) and weekends on [email or web address]

## 2016-09-19 NOTE — Progress Notes (Signed)
ANTICOAGULATION CONSULT NOTE - Follow Up Consult  Pharmacy Consult for Coumadin / bivalirudan Indication: LVAD  Allergies  Allergen Reactions  . Codeine Nausea And Vomiting  . Lipitor [Atorvastatin] Nausea Only    Nausea with high doses, tolerates 20mg  dose (08/22/16)    Patient Measurements: Height: 5\' 9"  (175.3 cm) Weight: 229 lb 8 oz (104.1 kg) IBW/kg (Calculated) : 70.7 Heparin Dosing Weight: n/a  Vital Signs: Temp: 98.3 F (36.8 C) (12/17 0400) BP: 104/77 (12/17 0800) Pulse Rate: 84 (12/17 0400)  Labs:  Recent Labs  09/17/16 0500  09/17/16 1041 09/18/16 0845 09/19/16 0353  HGB  --   < > 9.6* 8.9* 8.8*  HCT  --   --  29.1* 27.2* 27.0*  PLT  --   --  240 243 244  APTT 61*  --   --  60* 62*  LABPROT 18.8*  --   --  20.5* 19.8*  INR 1.56  --   --  1.74 1.66  CREATININE 0.95  --   --  0.87 0.85  < > = values in this interval not displayed.  Estimated Creatinine Clearance: 111.3 mL/min (by C-G formula based on SCr of 0.85 mg/dL).   Medical History: Past Medical History:  Diagnosis Date  . AICD (automatic cardioverter/defibrillator) present   . ASCVD (arteriosclerotic cardiovascular disease)   . Chronic systolic CHF (congestive heart failure) ([place])   . COPD (chronic obstructive pulmonary disease) ([place])   . Coronary artery disease   . Depression   . Diabetes mellitus   . History of cocaine abuse   . Hypertension   . Presence of permanent cardiac pacemaker   . Shortness of breath dyspnea   . Suicidal ideation   . Tobacco abuse     Medications:  Infusions:  . sodium chloride Stopped (09/11/16 1000)  . sodium chloride    . sodium chloride 10 mL/hr at 09/17/16 2052  . bivalirudin (ANGIOMAX) infusion 0.5 mg/mL (Non-ACS indications) 0.03 mg/kg/hr (09/19/16 0350)  . lactated ringers Stopped (09/12/16 1300)  . lactated ringers 10 mL/hr (09/10/16 2030)  . milrinone 0.375 mcg/kg/min (09/19/16 0350)    Assessment: 60 yo male s/p HM3 LVAD 09/09/16.  Coumadin  started by TCTS on 12/8, bivalirudin added 12/11 (TCTS managing). Pharmacy asked to begin managing Coumadin dosing.   Coumadin dose missed 12/12, and INR fell a bit to 1.44 now starting to bounce back 1.66. CBC ok.  - Keep in mind bivalirudan can falsely elevate INR.   Bivalirudan drip 0.03mg /kg/hr aptt at goal 62 sec.  Goal of Therapy:  aptt 50-80 sec INR 2-2.5 Monitor platelets by anticoagulation protocol: Yes   Plan:  1. Coumadin 10 mg x 1 tonight. 2. Daily PT/INR. 3. D/c bivalirudin after INR > 2 and recheck INR 4hr after stopped to ensure accurate INR.    Erin Hearing PharmD., BCPS Clinical Pharmacist Pager [phone] 09/19/2016 10:34 AM

## 2016-09-20 ENCOUNTER — Encounter (HOSPITAL_COMMUNITY): Payer: Self-pay | Admitting: Cardiothoracic Surgery

## 2016-09-20 LAB — PROTIME-INR
INR: 1.88
INR: 1.8
PROTHROMBIN TIME: 21.1 s — AB (ref 11.4–15.2)
Prothrombin Time: 21.9 seconds — ABNORMAL HIGH (ref 11.4–15.2)

## 2016-09-20 LAB — GLUCOSE, CAPILLARY
GLUCOSE-CAPILLARY: 191 mg/dL — AB (ref 65–99)
GLUCOSE-CAPILLARY: 138 mg/dL — AB (ref 65–99)
Glucose-Capillary: 157 mg/dL — ABNORMAL HIGH (ref 65–99)
Glucose-Capillary: 162 mg/dL — ABNORMAL HIGH (ref 65–99)

## 2016-09-20 LAB — BASIC METABOLIC PANEL
ANION GAP: 7 (ref 5–15)
BUN: 12 mg/dL (ref 6–20)
CO2: 31 mmol/L (ref 22–32)
Calcium: 8.4 mg/dL — ABNORMAL LOW (ref 8.9–10.3)
Chloride: 92 mmol/L — ABNORMAL LOW (ref 101–111)
Creatinine, Ser: 0.82 mg/dL (ref 0.61–1.24)
GFR calc Af Amer: >60 mL/min (ref >60)
GLUCOSE: 172 mg/dL — AB (ref 65–99)
POTASSIUM: 3.9 mmol/L (ref 3.5–5.1)
Sodium: 130 mmol/L — ABNORMAL LOW (ref 135–145)

## 2016-09-20 LAB — CBC
HEMATOCRIT: 28.3 % — AB (ref 39.0–52.0)
HEMOGLOBIN: 9.1 g/dL — AB (ref 13.0–17.0)
MCH: 29.6 pg (ref 26.0–34.0)
MCHC: 32.2 g/dL (ref 30.0–36.0)
MCV: 92.2 fL (ref 78.0–100.0)
Platelets: 293 10*3/uL (ref 150–400)
RBC: 3.07 MIL/uL — ABNORMAL LOW (ref 4.22–5.81)
RDW: 15.1 % (ref 11.5–15.5)
WBC: 11.9 10*3/uL — AB (ref 4.0–10.5)

## 2016-09-20 LAB — LACTATE DEHYDROGENASE: LDH: 242 U/L — ABNORMAL HIGH (ref 98–192)

## 2016-09-20 LAB — DIGOXIN LEVEL: Digoxin Level: 1.1 ng/mL (ref 0.8–2.0)

## 2016-09-20 LAB — COOXEMETRY PANEL
Carboxyhemoglobin: 2.2 % — ABNORMAL HIGH (ref 0.5–1.5)
Methemoglobin: 0.7 % (ref 0.0–1.5)
O2 Saturation: 47.5 %
Total hemoglobin: 9.2 g/dL — ABNORMAL LOW (ref 12.0–16.0)

## 2016-09-20 LAB — APTT: aPTT: 75 seconds — ABNORMAL HIGH (ref 24–36)

## 2016-09-20 MED ORDER — FUROSEMIDE 10 MG/ML IJ SOLN
40.0000 mg | Freq: Two times a day (BID) | INTRAMUSCULAR | Status: DC
  Administered 2016-09-20 – 2016-09-21 (×3): 40 mg via INTRAVENOUS
  Filled 2016-09-20 (×3): qty 4

## 2016-09-20 MED ORDER — FENTANYL CITRATE (PF) 100 MCG/2ML IJ SOLN: 50.0000 ug | Freq: Every evening | INTRAMUSCULAR | Status: DC | PRN

## 2016-09-20 MED ORDER — WARFARIN SODIUM 10 MG PO TABS: 10.0000 mg | ORAL_TABLET | Freq: Once | ORAL | Status: DC

## 2016-09-20 MED ORDER — ASPIRIN EC 81 MG PO TBEC
81.0000 mg | DELAYED_RELEASE_TABLET | Freq: Every day | ORAL | Status: DC
  Administered 2016-09-20 – 2016-09-25 (×6): 81 mg via ORAL
  Filled 2016-09-20 (×6): qty 1

## 2016-09-20 MED ORDER — WARFARIN SODIUM 10 MG PO TABS
12.5000 mg | ORAL_TABLET | Freq: Once | ORAL | Status: AC
  Administered 2016-09-20: 12.5 mg via ORAL
  Filled 2016-09-20: qty 1

## 2016-09-20 MED ORDER — FUROSEMIDE 10 MG/ML IJ SOLN: 40.0000 mg | Freq: Once | INTRAMUSCULAR | Status: DC

## 2016-09-20 MED ORDER — SORBITOL 70 % SOLN
30.0000 mL | Freq: Every day | Status: DC | PRN
  Administered 2016-09-20 – 2016-09-21 (×2): 30 mL via ORAL
  Filled 2016-09-20 (×3): qty 30

## 2016-09-20 MED ORDER — DIGOXIN 125 MCG PO TABS
0.1250 mg | ORAL_TABLET | Freq: Every day | ORAL | Status: DC
  Administered 2016-09-21 – 2016-09-25 (×5): 0.125 mg via ORAL
  Filled 2016-09-20 (×5): qty 1

## 2016-09-20 MED ORDER — FUROSEMIDE 40 MG PO TABS: 40.0000 mg | ORAL_TABLET | Freq: Every day | ORAL | Status: DC

## 2016-09-20 NOTE — Progress Notes (Addendum)
ANTICOAGULATION CONSULT NOTE - Follow Up Consult  Pharmacy Consult for Coumadin / bivalirudan Indication: LVAD  Allergies  Allergen Reactions  . Codeine Nausea And Vomiting  . Lipitor [Atorvastatin] Nausea Only    Nausea with high doses, tolerates 20mg  dose (Ronald/19/17)    Patient Measurements: Height: 5\' 9"  (175.3 cm) Weight: 228 lb 14.4 oz (103.8 kg) IBW/kg (Calculated) : 70.7 Heparin Dosing Weight: n/a  Vital Signs: Temp: 97.4 F (36.3 C) (12/18 0444) Temp Source: Oral (12/18 0444) BP: 79/54 (12/18 0800)  Labs:  Recent Labs  09/18/16 0845 09/19/16 0353 09/20/16 0428  HGB 8.9* 8.8* 9.1*  HCT 27.2* 27.0* 28.3*  PLT 243 244 293  APTT 60* 62* 75*  LABPROT 20.5* 19.8* 21.9*  INR 1.74 1.66 1.88  CREATININE 0.87 0.85 0.82    Estimated Creatinine Clearance: 115.1 mL/min (by C-G formula based on SCr of 0.82 mg/dL).   Medical History: Past Medical History:  Diagnosis Date  . AICD (automatic cardioverter/defibrillator) present   . ASCVD (arteriosclerotic cardiovascular disease)   . Chronic systolic CHF (congestive heart failure) ([place])   . COPD (chronic obstructive pulmonary disease) ([place])   . Coronary artery disease   . Depression   . Diabetes mellitus   . History of cocaine abuse   . Hypertension   . Presence of permanent cardiac pacemaker   . Shortness of breath dyspnea   . Suicidal ideation   . Tobacco abuse     Medications:  Infusions:  . sodium chloride Stopped (09/11/16 1000)  . sodium chloride    . sodium chloride 10 mL/hr at 09/17/16 2052  . lactated ringers Stopped (09/12/16 1300)  . lactated ringers 10 mL/hr (09/10/16 2030)  . milrinone 0.375 mcg/kg/min (09/20/16 1041)    Assessment: 60 yo Miller s/p HM3 LVAD 09/09/16.  Coumadin started by TCTS on 12/8, bivalirudin added 12/Ronald (TCTS managing). Pharmacy asked to begin managing Coumadin dosing.   Coumadin dose missed 12/12, and INR fell a bit to 1.44. CBC ok.  Today's INR 1.88 on bivalirudin -  (bivalirudin can falsely elevate INR)   Bivalirudin drip 0.03mg /kg/hr aptt at goal 75 sec.  Goal of Therapy:  aptt 50-80 sec INR 2-2.5 Monitor platelets by anticoagulation protocol: Yes   Plan:  1. Bivalirudin stopped this morning.  Will recheck INR now. 2. Coumadin 10 mg po x 1 again tonight. 3. Daily PT/INR. 4. Coumadin education before discharge.  Uvaldo Rising, BCPS  Clinical Pharmacist Pager [phone]  09/20/2016 2:40 PM    Addendum:  Repeat INR came back as 1.8 this evening, will increase planned warfarin dose to 12.5 mg x 1 this evening and hold on restarting bivalirudin at this time.   Vincenza Hews, PharmD, BCPS 09/20/2016, 4:29 PM Pager: [phone]

## 2016-09-20 NOTE — Care Management Important Message (Signed)
Important Message  Patient Details  Name: Ronald Miller. MRN: [id number] Date of Birth: 1955-12-21   Medicare Important Message Given:  Yes    [name] 09/20/2016, 10:49 AM

## 2016-09-20 NOTE — Progress Notes (Signed)
OT Cancellation Note  Patient Details Name: Ronald Miller. MRN: [id number] DOB: Feb 27, 1956   Cancelled Treatment:    Reason Eval/Treat Not Completed: Patient at procedure or test/ unavailable.  Pt undergoing dsg change.  Will reattempt.  [employer], OTR/L [id number]   Lucille Passy M 09/20/2016, 4:42 PM

## 2016-09-20 NOTE — Progress Notes (Signed)
PT Cancellation Note  Patient Details Name: Ronald Miller. MRN: [id number] DOB: July 13, 1956   Cancelled Treatment:    Reason Eval/Treat Not Completed: Patient at procedure or test/unavailable (pt currently working with cardiac rehab will attempt later as time allows)   Rocky Gladden B Shonda Mandarino 09/20/2016, 9:37 AM  Elwyn Reach, [place]

## 2016-09-20 NOTE — Progress Notes (Signed)
Patient complaining of "full"feeling unable to eat, abdomen is slightly distended and tight to touch. Pt states uncomfortable. Patient has voided 400 out since IV lasix this AM and patient has received medication for constipation as ordered. Amy Clegg [facility] made aware of patient status  Orders received will continue to monitor patient. Janeka Libman, Bettina Gavia RN

## 2016-09-20 NOTE — Progress Notes (Signed)
PT Cancellation Note  Patient Details Name: Ronald Miller. MRN: [id number] DOB: April 02, 1956   Cancelled Treatment:     Pt refusing tx despite education and encouragement.  Pt reports intolerable pain in abdomen but does not appear in pain.  Will f/u when patient is agreeable.   Danissa Rundle Eli Hose 09/20/2016, 2:28 PM Governor Rooks, PTA pager [phone]

## 2016-09-20 NOTE — Progress Notes (Signed)
CSW met with patient at bedside. Patient spoke at length about hospitalization and decline today. Patient reports "I have some fluid build up" and states frustration with disease. Patient reviewed past life experiences and lack of friends visiting. He reviewed his own life and spoke of some regrets. CSW offered supportive interventions and encouraged patient with continued recovery. Patient appeared to have improved mood post visit and states motivation for recovery. CSW will continue to follow for support throughout implant hospitalization and through outpatient clinic. Raquel Sarna, LCSW [phone]

## 2016-09-20 NOTE — Progress Notes (Signed)
CARDIAC REHAB PHASE I   PRE:  Rate/Rhythm: 80 paced  BP:  Sitting: 80 MAP        SaO2: 93 RA  MODE:  Ambulation: 350 ft   POST:  Rate/Rhythm: 87 paced   BP:  Sitting: 94 MAP         SaO2: 92 RA  Pt c/o " having fluid," shortness of breath, nausea this morning. Pt anxious, very frustrated, initially declined ambulation, ultimately agreeable. Assisted pt to use urinal prior to ambulation. Pt able to stand min assist, required some assistance, verbal cues and use of glasses to don batteries. Pt still requiring assistance to don vest and place batteries in vest. Pt ambulated 350 ft on RA, rollator, IV, assist x2 for safety/equipment, steady gait, tolerated well. Pt c/o some DOE, sats 92-93 on RA with ambulation, standing rest x1, seated rest x1. Pt able to return to wall power source with minimal assist. Pt to recliner after walk, feet elevated, call bell within reach. Will follow.   [id number] Lenna Sciara, RN, BSN 09/20/2016 10:19 AM

## 2016-09-20 NOTE — Progress Notes (Signed)
Patient ID: Ronald Blondin., male   DOB: 1955-12-19, 60 y.o.   MRN: [id number]  HeartMate 2 Rounding Note  Subjective:    S/P HMII 09/09/2016   Milrinone increased to 0.375. MAPs 80s. Also on bivalirudin.   CO-OX 48%   Denies SOB. Pain controlled.   LVAD INTERROGATION:  HeartMate III LVAD:  Flow 5.1 liters/min, speed 5600, power 4, PI 2.7  Objective:    Vital Signs:   Temp:  [97.4 F (36.3 C)-97.5 F (36.4 C)] 97.4 F (36.3 C) (12/18 0444) Pulse Rate:  [79-89] 79 (12/17 2011) Resp:  [18] 18 (12/18 0444) BP: (79-104)/(64-84) 90/69 (12/18 0400) SpO2:  [95 %] 95 % (12/18 0444) Weight:  [228 lb 14.4 oz (103.8 kg)] 228 lb 14.4 oz (103.8 kg) (12/18 0444) Last BM Date: 09/16/16 Mean arterial Pressure 70-80s  Intake/Output:   Intake/Output Summary (Last 24 hours) at 09/20/16 0741 Last data filed at 09/19/16 1200  Gross per 24 hour  Intake                0 ml  Output              900 ml  Net             -900 ml     Physical Exam: General:  In chair . NAD.  HEENT: normal Neck: supple. JVP 9-10.  Carotids 2+ bilat; no bruits. No thyromegaly or nodule noted. RIJ swan  Cor: Mechanical heart sounds with LVAD hum present. + CTs Lungs: CTAB, normal effort.  Abdomen: soft, NT, +distended, no HSM. No bruits or masses. +BS  Driveline: C/D/I; securement device intact and driveline incorporated Extremities: no cyanosis, clubbing, rash, Trace edema Neuro: Awake and oriented. Moves all 4 extremities on command and without difficulty.    Telemetry: Reviewed, V-paced 70s  Labs: Basic Metabolic Panel:  Recent Labs Lab 09/14/16 0515  09/15/16 0630 09/16/16 0400 09/17/16 0500 09/18/16 0845 09/19/16 0353 09/20/16 0428  NA 134*  < > 138 135 133* 131* 130* 130*  K 3.4*  < > 3.1* 4.6 4.1 4.3 3.9 3.9  CL 90*  < > 104 96* 95* 95* 92* 92*  CO2 35*  --  28 33* 30 29 28 31   GLUCOSE 138*  < > 106* 107* 128* 166* 192* 172*  BUN 19  < > 14 18 18 17 16 12   CREATININE 0.84  < > 0.72  0.94 0.95 0.87 0.85 0.82  CALCIUM 8.3*  --  6.6* 8.2* 8.0* 8.0* 8.3* 8.4*  MG 1.8  --  1.8 2.2  --   --   --   --   < > = values in this interval not displayed.  Liver Function Tests:  Recent Labs Lab 09/15/16 0630 09/16/16 0400 09/17/16 0500  AST 25 25 21   ALT 50 48 35  ALKPHOS 40 52 51  BILITOT 0.4 0.5 0.8  PROT 4.6* 5.8* 5.8*  ALBUMIN 2.1* 2.7* 2.5*   No results for input(s): LIPASE, AMYLASE in the last 168 hours. No results for input(s): AMMONIA in the last 168 hours.  CBC:  Recent Labs Lab 09/14/16 0515  09/15/16 0400 09/16/16 0400 09/17/16 1041 09/18/16 0845 09/19/16 0353 09/20/16 0428  WBC 11.4*  --  8.7 12.0* 14.0* 11.5* 12.4* 11.9*  NEUTROABS 10.0*  --  6.2 9.3*  --   --   --   --   HGB 9.4*  < > 8.6* 9.0* 9.6* 8.9* 8.8* 9.1*  HCT 28.2*  < >  25.5* 27.7* 29.1* 27.2* 27.0* 28.3*  MCV 92.5  --  92.4 93.9 93.6 93.2 92.5 92.2  PLT 105*  --  138* 206 240 243 244 293  < > = values in this interval not displayed.  INR:  Recent Labs Lab 09/16/16 0400 09/17/16 0500 09/18/16 0845 09/19/16 0353 09/20/16 0428  INR 1.44 1.56 1.74 1.66 1.88    Other results:    Imaging: No results found.   Medications:     Scheduled Medications: . sodium chloride   Intravenous Once  . sodium chloride   Intravenous Once  . amiodarone  400 mg Oral BID  . aspirin EC  325 mg Oral Daily  . atorvastatin  20 mg Oral QHS  . bisacodyl  10 mg Oral Daily  . dextromethorphan-guaiFENesin  1 tablet Oral BID  . digoxin  250 mcg Oral Daily  . docusate sodium  200 mg Oral Daily  . escitalopram  20 mg Oral Daily  . feeding supplement (GLUCERNA SHAKE)  237 mL Oral BID BM  . insulin aspart  0-15 Units Subcutaneous TID WC  . insulin aspart  0-5 Units Subcutaneous QHS  . insulin detemir  10 Units Subcutaneous BID  . magnesium oxide  400 mg Oral Daily  . mouth rinse  15 mL Mouth Rinse BID  . multivitamin with minerals  1 tablet Oral Daily  . pantoprazole  40 mg Oral Daily  .  pregabalin  75 mg Oral Daily  . spironolactone  25 mg Oral Daily  . warfarin   Does not apply Once  . Warfarin - Pharmacist Dosing Inpatient   Does not apply q1800    Infusions: . sodium chloride Stopped (09/11/16 1000)  . sodium chloride    . sodium chloride 10 mL/hr at 09/17/16 2052  . bivalirudin (ANGIOMAX) infusion 0.5 mg/mL (Non-ACS indications) 0.03 mg/kg/hr (09/19/16 0350)  . lactated ringers Stopped (09/12/16 1300)  . lactated ringers 10 mL/hr (09/10/16 2030)  . milrinone 0.375 mcg/kg/min (09/20/16 0242)    PRN Medications: sodium chloride, albumin human, albuterol, fentaNYL (SUBLIMAZE) injection, Melatonin, ondansetron (ZOFRAN) IV, oxyCODONE, senna, sodium chloride flush, sodium chloride flush   Assessment/Plan/Discussion    1. 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  Echo 08/24/16 LVEF 15%, RV mild dilated, moderately reduced.  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 On coumadin INR 1.88. Continue aspirin 325 mg daily until INR low threshold of goal (INR goal 2-2.5)  - Volume status elevated. Increase lasix to 40 mg IV twice a day.  - CO-OX 48% despite increase in milrinone to 0.375 mcg.  -Dig level elevated. Cut digoxin back to 0.125 mg daily.  - VAD parameters stable.  - Continue to mobilize as tolerated.  2. AKI on CKD III: - Stable.   3. Hypercarbic respiratory failure - Improved 4. CAD: No CP. 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  5. DMII: Hgb A1C 8.3 on 08/26/2016  - Cover with SSI  6. RLE cellulitis: Completed 8 days of Vancomycin.  7. Tobacco abuse 8. Hypokalemia:  - Continue to supp as needed.   - Continue spiro.  9. Atrial fibrillation - back in NSR on amio. Will continue 10. Pocket pain - continue Lyrica. Can titrate as needed  Possible D/C 24-48 hours. [facility] for home milrinone.   I reviewed the LVAD  parameters from today, and compared the results to the patient's prior recorded  data.  No programming changes were made.  The LVAD is functioning within specified parameters.  The patient performs LVAD self-test daily.  LVAD interrogation was negative for any significant power changes, alarms or PI events/speed drops.  LVAD equipment check completed and is in good working order.  Back-up equipment present.   LVAD education done on emergency procedures and precautions and reviewed exit site care.  Length of Stay: [place] NP-C 09/20/2016, 7:41 AM  VAD Team --- VAD ISSUES ONLY--- Pager [phone] (7am - 7am)  Advanced Heart Failure Team  Pager [phone] (M-F; 7a - 4p)  Please contact [facility] for night-coverage after hours (4p -7a ) and weekends on [email or web address]  Patient seen and examined with Darrick Grinder, NP. We discussed all aspects of the encounter. I agree with the assessment and plan as stated above.   Progressing but remains volume overloaded and co-ox still low despite milrinone. Will resume IV lasix. Continue milrinone for now. Will try to wean as tolerated. Will likely need home inotropes. Ramp echo tomorrow. Teaching and mobilization continues. Maintaining NSR on po amio. INR 1.9. Can stop bival. Sorbitol for constipation.   Bensimhon, Daniel,MD 12:44 PM

## 2016-09-21 ENCOUNTER — Inpatient Hospital Stay (HOSPITAL_COMMUNITY): Payer: Medicare HMO

## 2016-09-21 DIAGNOSIS — I509 Heart failure, unspecified: Secondary | ICD-10-CM

## 2016-09-21 LAB — BASIC METABOLIC PANEL
Anion gap: 6 (ref 5–15)
BUN: 14 mg/dL (ref 6–20)
CO2: 34 mmol/L — AB (ref 22–32)
CREATININE: 0.84 mg/dL (ref 0.61–1.24)
Calcium: 8.3 mg/dL — ABNORMAL LOW (ref 8.9–10.3)
Chloride: 90 mmol/L — ABNORMAL LOW (ref 101–111)
GFR calc non Af Amer: >60 mL/min (ref >60)
Glucose, Bld: 238 mg/dL — ABNORMAL HIGH (ref 65–99)
Potassium: 3.7 mmol/L (ref 3.5–5.1)
Sodium: 130 mmol/L — ABNORMAL LOW (ref 135–145)

## 2016-09-21 LAB — GLUCOSE, CAPILLARY
GLUCOSE-CAPILLARY: 224 mg/dL — AB (ref 65–99)
GLUCOSE-CAPILLARY: 172 mg/dL — AB (ref 65–99)
Glucose-Capillary: 100 mg/dL — ABNORMAL HIGH (ref 65–99)

## 2016-09-21 LAB — CBC
HCT: 28.1 % — ABNORMAL LOW (ref 39.0–52.0)
HEMOGLOBIN: 9.1 g/dL — AB (ref 13.0–17.0)
MCH: 30.3 pg (ref 26.0–34.0)
MCHC: 32.4 g/dL (ref 30.0–36.0)
MCV: 93.7 fL (ref 78.0–100.0)
Platelets: 261 10*3/uL (ref 150–400)
RBC: 3 MIL/uL — AB (ref 4.22–5.81)
RDW: 15.3 % (ref 11.5–15.5)
WBC: 12.3 10*3/uL — ABNORMAL HIGH (ref 4.0–10.5)

## 2016-09-21 LAB — ECHOCARDIOGRAM LIMITED
Height: 69 in
Weight: 3657.6 oz

## 2016-09-21 LAB — COOXEMETRY PANEL
Carboxyhemoglobin: 1.7 % — ABNORMAL HIGH (ref 0.5–1.5)
Methemoglobin: 0.9 % (ref 0.0–1.5)
O2 Saturation: 51.1 %
Total hemoglobin: 12 g/dL (ref 12.0–16.0)

## 2016-09-21 LAB — PROTIME-INR
INR: 2.02
PROTHROMBIN TIME: 23.1 s — AB (ref 11.4–15.2)

## 2016-09-21 LAB — LACTATE DEHYDROGENASE: LDH: 210 U/L — ABNORMAL HIGH (ref 98–192)

## 2016-09-21 MED ORDER — POTASSIUM CHLORIDE CRYS ER 20 MEQ PO TBCR
40.0000 meq | EXTENDED_RELEASE_TABLET | Freq: Once | ORAL | Status: AC
  Administered 2016-09-21: 40 meq via ORAL
  Filled 2016-09-21: qty 2

## 2016-09-21 MED ORDER — WARFARIN SODIUM 2.5 MG PO TABS
12.5000 mg | ORAL_TABLET | Freq: Once | ORAL | Status: AC
  Administered 2016-09-21: 12.5 mg via ORAL
  Filled 2016-09-21: qty 1

## 2016-09-21 MED ORDER — FUROSEMIDE 10 MG/ML IJ SOLN
80.0000 mg | Freq: Two times a day (BID) | INTRAMUSCULAR | Status: DC
  Administered 2016-09-21 – 2016-09-25 (×9): 80 mg via INTRAVENOUS
  Filled 2016-09-21 (×9): qty 8

## 2016-09-21 NOTE — Progress Notes (Signed)
  HeartMate III Ramp ECHO study    Speed  Flow  PI  Power  LVIDD  AI  Aortic openings  MR  TR  Septum  RV                5600  5.1 2.4 4.2 5.9 trivial 4/5 trivial trivial Bowing to right mod HK  5800  5.4 2.2 4.5  trivial 2/5 trivial trivial Slightly to right mod HK  5900  5.5 2.4 4.7 5.6 trivial 1/5 trivial trivial Slightly to right mod HK                Ramp ECHO performed at bedside.  At completion of ramp study, patients primary and back up controller programmed: (Automatic via HM3, confirmed with [employer] Rep) Fixed speed: 5900 Low speed limit: 5600   Moderate pleural effusion noted. Needs more diuresis.   Performed with Dr. Haroldine Laws at bedside.   Ronald Como "Jonni Sanger" Eagar, PA-C 09/21/2016 11:44 AM   I was present for and supervised entire study.   Ronald Manansala,MD 11:27 PM

## 2016-09-21 NOTE — Progress Notes (Deleted)
Nutrition Follow-up  DOCUMENTATION CODES:   Obesity unspecified  INTERVENTION:    Continue Glucerna Shake po BID, each supplement provides 220 kcal and 10 grams of protein  NEW NUTRITION DIAGNOSIS:   Increased nutrient needs related to  (post-op healing) as evidenced by estimated needs, ongoing  GOAL:   Patient will meet greater than or equal to 90% of their needs, progressing  MONITOR:   PO intake, Supplement acceptance, Labs, Weight trends, I & O's  ASSESSMENT:   This is a 60 y.o. male with a past medical history significant for chronic systolic CHF with EF 0000000 by echo in 10/2015, mildly dilated RV, s/p medtronic AICD in 2014, CAD s/p CABG in 2000, HTN, remote cocaine abuse, smoking, depression and COPD. He has been admitted twice in May of this year for decompensated CHF with impressive diuresis (from 247 ->219 lbs) and subsequently later in the month with diuresis again down to 228 lbs. He was again admitted with CHF exacerbation in 03/2016 with low cardiac output and biventricular failure. He was started on milrinone gtts and sent home on 0.25 mcg/kg/min. He diuresed 14 lbs to a weight of 227 lbs. Most recent LVEF on echo was 20% with moderate to severe RV hypokinesis. He was referred to Dr. Hoyt [facility] at [facility] for transplant work-up, but apparently this was put on hold due to ongoing tobacco use and + cocaine drug screen.  Patient s/p procedures 12/7: REDO STERNOTOMY  INSERTION OF IMPLANTABLE LEFT VENTRICULAR ASSIST DEVICE (N/A) - HeartMate 3 CIRC ARREST  Extubated 12/8. PO intake improved at 50-75% per flowsheets. Receiving his Glucerna Shakes BID. CBG's 138-224-100.  Diet Order:  Diet heart healthy/carb modified Room service appropriate? Yes; Fluid consistency: Thin  Skin:  Reviewed, no issues  Last BM:  12/14  Height:   Ht Readings from Last 1 Encounters:  08/22/16 5\' 9"  (1.753 m)    Weight:   Wt Readings from Last 1 Encounters:  09/21/16 228 lb 9.6  oz (103.7 kg)    Ideal Body Weight:  72.7 kg  BMI:  Body mass index is 33.76 kg/m.  Estimated Nutritional Needs:   Kcal:  1800-2000  Protein:  95-110 grams  Fluid:  per MD  EDUCATION NEEDS:   No education needs identified at this time  Arthur Holms, RD, LDN Pager #: [phone] After-Hours Pager #: [phone]

## 2016-09-21 NOTE — Progress Notes (Signed)
  Echocardiogram 2D Echocardiogram limited has been performed.  Ronald Miller 09/21/2016, 11:44 AM

## 2016-09-21 NOTE — Progress Notes (Signed)
CARDIAC REHAB PHASE I   PRE:  Rate/Rhythm: 74 paced  BP:  Supine:   Sitting: 78 MAP  Standing:    SaO2: 93%RA  MODE:  Ambulation: 150 ft   POST:  Rate/Rhythm: 86 paced  BP:  Supine:   Sitting: 92 MAP  Standing:    SaO2: 93%RA 1358-1452 Pt walked 150 ft on RA with rollator and asst x 2. Did not feel as if he could walk farther. Pt feeling bloated in stomach. Stated he had BM. Pt was on batteries but wanted to go to power source. Pt assisted to do so. Pt stated he needs to make sure wires are not twisted and secure which we agreed. Still in pain with movement but willing to walk. Second walk today.   Graylon Good, RN BSN  09/21/2016 2:49 PM

## 2016-09-21 NOTE — Progress Notes (Signed)
ANTICOAGULATION CONSULT NOTE - Follow Up Consult  Pharmacy Consult for Coumadin / bivalirudan Indication: LVAD  Allergies  Allergen Reactions  . Codeine Nausea And Vomiting  . Lipitor [Atorvastatin] Nausea Only    Nausea with high doses, tolerates 20mg  dose (08/22/16)    Patient Measurements: Height: 5\' 9"  (175.3 cm) Weight: 228 lb 9.6 oz (103.7 kg) IBW/kg (Calculated) : 70.7 Heparin Dosing Weight: n/a  Vital Signs: Temp: 98.2 F (36.8 C) (12/19 0400) Temp Source: Oral (12/19 0400) BP: 90/73 (12/19 0812) Pulse Rate: 75 (12/19 0400)  Labs:  Recent Labs  09/19/16 0353 09/20/16 0428 09/20/16 1516 09/21/16 0437  HGB 8.8* 9.1*  --  9.1*  HCT 27.0* 28.3*  --  28.1*  PLT 244 293  --  261  APTT 62* 75*  --   --   LABPROT 19.8* 21.9* 21.1* 23.1*  INR 1.66 1.88 1.80 2.02  CREATININE 0.85 0.82  --  0.84    Estimated Creatinine Clearance: 112.4 mL/min (by C-G formula based on SCr of 0.84 mg/dL).   Medical History: Past Medical History:  Diagnosis Date  . AICD (automatic cardioverter/defibrillator) present   . ASCVD (arteriosclerotic cardiovascular disease)   . Chronic systolic CHF (congestive heart failure) ([place])   . COPD (chronic obstructive pulmonary disease) ([place])   . Coronary artery disease   . Depression   . Diabetes mellitus   . History of cocaine abuse   . Hypertension   . Presence of permanent cardiac pacemaker   . Shortness of breath dyspnea   . Suicidal ideation   . Tobacco abuse     Medications:  Infusions:  . sodium chloride Stopped (09/11/16 1000)  . sodium chloride    . sodium chloride 10 mL/hr at 09/17/16 2052  . lactated ringers Stopped (09/12/16 1300)  . lactated ringers 10 mL/hr (09/10/16 2030)  . milrinone 0.375 mcg/kg/min (09/21/16 0409)    Assessment: 60 yo male s/p HM3 LVAD 09/09/16.  Coumadin started by TCTS on 12/8, bivalirudin added 12/11 (TCTS managing). Pharmacy asked to begin managing Coumadin dosing.   Coumadin dose missed  12/12, and INR fell a bit to 1.44. CBC ok. No bleeding noted Today's INR 2.0 off bivalirudin   Goal of Therapy:  aptt 50-80 sec INR 2-2.5 Monitor platelets by anticoagulation protocol: Yes   Plan:  1. Bivalirudin stopped 2. Coumadin 12.5 mg po x 1 again tonight. 3. Daily PT/INR. 4. Coumadin education before discharge.  Erin Hearing PharmD., BCPS Clinical Pharmacist Pager [phone] 09/21/2016 11:16 AM

## 2016-09-21 NOTE — Progress Notes (Signed)
Nutrition Follow-up  DOCUMENTATION CODES:   Obesity unspecified  INTERVENTION:    Continue Glucerna Shake po BID, each supplement provides 220 kcal and 10 grams of protein  NUTRITION DIAGNOSIS:   Increased nutrient needs related to  (post-op healing) as evidenced by estimated needs, ongoing  GOAL:   Patient will meet greater than or equal to 90% of their needs, progressing  MONITOR:   PO intake, Supplement acceptance, Labs, Weight trends, I & O's  ASSESSMENT:   This is a 60 y.o. male with a past medical history significant for chronic systolic CHF with EF 0000000 by echo in 10/2015, mildly dilated RV, s/p medtronic AICD in 2014, CAD s/p CABG in 2000, HTN, remote cocaine abuse, smoking, depression and COPD. He has been admitted twice in May of this year for decompensated CHF with impressive diuresis (from 247 ->219 lbs) and subsequently later in the month with diuresis again down to 228 lbs. He was again admitted with CHF exacerbation in 03/2016 with low cardiac output and biventricular failure. He was started on milrinone gtts and sent home on 0.25 mcg/kg/min. He diuresed 14 lbs to a weight of 227 lbs. Most recent LVEF on echo was 20% with moderate to severe RV hypokinesis. He was referred to Dr. Hoyt Koch at [facility] for transplant work-up, but apparently this was put on hold due to ongoing tobacco use and + cocaine drug screen.  Patient s/p procedures 12/7: REDO STERNOTOMY  INSERTION OF IMPLANTABLE LEFT VENTRICULAR ASSIST DEVICE (N/A) - HeartMate 3 CIRC ARREST  Extubated 12/8. PO intake improved at 50-75% per flowsheets. Receiving his Glucerna Shakes BID. CBG's 138-224-100.  Diet Order:  Diet heart healthy/carb modified Room service appropriate? Yes; Fluid consistency: Thin  Skin:  Reviewed, no issues  Last BM:  12/14  Height:   Ht Readings from Last 1 Encounters:  08/22/16 5\' 9"  (1.753 m)    Weight:   Wt Readings from Last 1 Encounters:  09/21/16 228 lb 9.6 oz  (103.7 kg)    Ideal Body Weight:  72.7 kg  BMI:  Body mass index is 33.76 kg/m.  Estimated Nutritional Needs:   Kcal:  1800-2000  Protein:  95-110 grams  Fluid:  per MD  EDUCATION NEEDS:   No education needs identified at this time  Arthur Holms, RD, LDN Pager #: [phone] After-Hours Pager #: [phone]

## 2016-09-21 NOTE — Progress Notes (Signed)
Occupational Therapy Treatment Patient Details Name: Ronald Miller. MRN: [id number] DOB: 06/30/1956 Today's Date: 09/21/2016    History of present illness Patient is a 60 yo male admitted 08/22/16 with acute on chronic systolic HF, with LVEF at 15%. LVAD implantation 09/09/16  PMH:  chronic HF, ICM, CAD, CABG, ICD, HTN, polysubstance abuse, depression, COPD, CKD, DM   OT comments  Pt is able to perform ADLs at min guard - min A  Level, but is still allowing nsg staff to perform for him - encouraged him to take this over.   Pt requires mod cues for sternal precautions and reminders to keep controller secure during transitional movements (he initially failed to secure it).  Continues with c/o pain sternum and abdomen.   Follow Up Recommendations  Home health OT;Supervision/Assistance - 24 hour    Equipment Recommendations  None recommended by OT    Recommendations for Other Services      Precautions / Restrictions Precautions Precautions: Sternal;Fall Precaution Comments: LVAD       Mobility Bed Mobility               General bed mobility comments: in chair on arrival  Transfers Overall transfer level: Needs assistance Equipment used: None Transfers: Sit to/from Stand;Stand Pivot Transfers Sit to Stand: Min guard Stand pivot transfers: Min guard            Balance Overall balance assessment: Needs assistance Sitting-balance support: Feet supported Sitting balance-Leahy Scale: Good     Standing balance support: No upper extremity supported;During functional activity Standing balance-Leahy Scale: Fair                     ADL Overall ADL's : Needs assistance/impaired         Upper Body Bathing: Set up;Supervision/ safety;Sitting   Lower Body Bathing: Minimal assistance;Sit to/from stand Lower Body Bathing Details (indicate cue type and reason): Pt is still allowing staff to bathe him, but he demonstrates ability to do it with min guard  assistance - min A.  Encouraged him to begin to take over bathing tasks      Lower Body Dressing: Min guard;Sit to/from stand Lower Body Dressing Details (indicate cue type and reason): Pt able to cross ankles over knees to don/doff socks.  Becomes agitated stating "now you made me hurt"  Toilet Transfer: Min guard;Stand-pivot           Functional mobility during ADLs: Min guard;Minimal assistance        Vision                     Perception     Praxis      Cognition   Behavior During Therapy: Impulsive (irritable) Overall Cognitive Status: Impaired/Different from baseline Area of Impairment: Attention;Safety/judgement;Problem solving;Memory   Current Attention Level: Selective Memory: Decreased recall of precautions    Safety/Judgement: Decreased awareness of safety   Problem Solving: Requires verbal cues General Comments: Pt irritated that he keeps being told "you can't do this and you can't do that"  He requires mod cues for sternal precautions and claims no one told him these while on ICU     Extremity/Trunk Assessment               Exercises     Shoulder Instructions       General Comments      Pertinent Vitals/ Pain       Pain Assessment: Faces Faces Pain Scale: Hurts  whole lot Pain Location: chest and abdomen  Pain Descriptors / Indicators: Grimacing;Guarding Pain Intervention(s): Monitored during session;Limited activity within patient's tolerance  Home Living                                          Prior Functioning/Environment              Frequency  Min 3X/week        Progress Toward Goals  OT Goals(current goals can now be found in the care plan section)  Progress towards OT goals: Progressing toward goals     Plan Discharge plan remains appropriate    Co-evaluation                 End of Session Equipment Utilized During Treatment: Oxygen   Activity Tolerance Patient limited by  fatigue;Patient limited by pain   Patient Left in chair;with call bell/phone within reach;with chair alarm set   Nurse Communication Mobility status        Time: [id number] OT Time Calculation (min): 12 min  Charges: OT General Charges $OT Visit: 1 Procedure OT Treatments $Self Care/Home Management : 8-22 mins  Karimah Winquist M 09/21/2016, 4:26 PM

## 2016-09-21 NOTE — Progress Notes (Signed)
Physical Therapy Treatment Patient Details Name: Ronald Miller. MRN: [id number] DOB: 06/16/1956 Today's Date: 09/21/2016    History of Present Illness Patient is a 60 yo male admitted 08/22/16 with acute on chronic systolic HF, with LVEF at 15%. LVAD implantation 09/09/16  PMH:  chronic HF, ICM, CAD, CABG, ICD, HTN, polysubstance abuse, depression, COPD, CKD, DM    PT Comments    Pt irritated by encouragement to ambulate and agreed but agitated by cues and education for precautions as pt completely ignores his sternal precautions. Pt able to switch power sources without physical assist with min cues as pt continues to lose track of which connection he is changing. Assist to don/doff vest and place batteries in vest. Pt educated for all precautions, benefit of mobility to assist need for BM and function. Will continue to follow.  Automatic BP pre 90/73 , MAP 80, HR 79 Post 96/81, map 88, HR 88 Speed 600 PI 2.9 Flow 5.1 Power 4.2-4.3 sats 92-95% on RA   Follow Up Recommendations  Home health PT;Supervision for mobility/OOB     Equipment Recommendations       Recommendations for Other Services       Precautions / Restrictions Precautions Precautions: Sternal;Fall Precaution Comments: LVAD    Mobility  Bed Mobility               General bed mobility comments: in chair on arrival  Transfers Overall transfer level: Needs assistance   Transfers: Sit to/from Stand Sit to Stand: Min guard         General transfer comment: mod cues for hand placement and precautions as pt continues to pull on chair despite education and cues  Ambulation/Gait Ambulation/Gait assistance: Min guard Ambulation Distance (Feet): 300 Feet Assistive device: Rolling walker (2 wheeled) Gait Pattern/deviations: Step-through pattern;Decreased stride length;Trunk flexed   Gait velocity interpretation: Below normal speed for age/gender General Gait Details: cues for posture, looking up     Stairs            Wheelchair Mobility    Modified Rankin (Stroke Patients Only)       Balance Overall balance assessment: Needs assistance   Sitting balance-Leahy Scale: Fair       Standing balance-Leahy Scale: Fair                      Cognition Arousal/Alertness: Awake/alert Behavior During Therapy: Agitated Overall Cognitive Status: Impaired/Different from baseline Area of Impairment: Memory;Orientation Orientation Level: Time   Memory: Decreased recall of precautions              Exercises      General Comments        Pertinent Vitals/Pain Pain Score: 8  Pain Location: , abdomen Pain Descriptors / Indicators: Aching Pain Intervention(s): Limited activity within patient's tolerance;Monitored during session;Repositioned    Home Living                      Prior Function            PT Goals (current goals can now be found in the care plan section) Progress towards PT goals: Progressing toward goals    Frequency    Min 3X/week      PT Plan Current plan remains appropriate;Frequency needs to be updated    Co-evaluation             End of Session   Activity Tolerance: Patient tolerated treatment well Patient left: in chair;with  call bell/phone within reach;with chair alarm set     Time: [phone] PT Time Calculation (min) (ACUTE ONLY): 28 min  Charges:  $Gait Training: 8-22 mins $Therapeutic Activity: 8-22 mins                    G Codes:      Emoree Sasaki B Ibrahim Mcpheeters 2016-10-17, 10:10 AM Elwyn Reach, [place]

## 2016-09-21 NOTE — Progress Notes (Signed)
Notified on-call physician of lower MAP reading this evening compared to earlier trend. Patient is asymptomatic, sitting in recliner. No new orders will continue to monitor.

## 2016-09-21 NOTE — Progress Notes (Signed)
  Echocardiogram 2D Echocardiogram limited has been performed.  Bobbye Charleston 09/21/2016, 11:40 AM

## 2016-09-21 NOTE — Progress Notes (Signed)
Patient ID: Ronald Luberda., male   DOB: 21-Apr-1956, 60 y.o.   MRN: [id number]  HeartMate 2 Rounding Note  Subjective:    S/P HMII 09/09/2016   Milrinone increased to 0.375. MAPs 80s.  INR 2.02  CO-OX 51%   Denies SOB. Pain controlled.   LVAD INTERROGATION:  HeartMate III LVAD:  Flow 5.1 liters/min, speed 5600, power 4.4, PI 2.8 Rare PI  Objective:    Vital Signs:   Temp:  [98.1 F (36.7 C)-98.2 F (36.8 C)] 98.2 F (36.8 C) (12/19 0400) Pulse Rate:  [75] 75 (12/19 0400) Resp:  [18] 18 (12/19 0400) BP: (79-95)/(54-82) 91/79 (12/19 0400) SpO2:  [90 %-95 %] 95 % (12/19 0400) Weight:  [228 lb 9.6 oz (103.7 kg)] 228 lb 9.6 oz (103.7 kg) (12/19 0400) Last BM Date: 09/16/16 Mean arterial Pressure 70-80s  Intake/Output:   Intake/Output Summary (Last 24 hours) at 09/21/16 0756 Last data filed at 09/21/16 0436  Gross per 24 hour  Intake          1261.64 ml  Output             1400 ml  Net          -138.36 ml     Physical Exam: General:  In chair . NAD.  HEENT: normal Neck: supple. JVP 0-11.  Carotids 2+ bilat; no bruits. No thyromegaly or nodule noted. RIJ swan  Cor: Mechanical heart sounds with LVAD hum present. + CTs Lungs: CTAB, normal effort.  Abdomen: soft, NT, +distended, no HSM. No bruits or masses. +BS  Driveline: C/D/I; securement device intact and driveline incorporated Extremities: no cyanosis, clubbing, rash, R and LLE 1+ edema Neuro: Awake and oriented. Moves all 4 extremities on command and without difficulty.    Telemetry: Reviewed, V-paced 70s  Labs: Basic Metabolic Panel:  Recent Labs Lab 09/15/16 0630 09/16/16 0400 09/17/16 0500 09/18/16 0845 09/19/16 0353 09/20/16 0428 09/21/16 0437  NA 138 135 133* 131* 130* 130* 130*  K 3.1* 4.6 4.1 4.3 3.9 3.9 3.7  CL 104 96* 95* 95* 92* 92* 90*  CO2 28 33* 30 29 28 31  34*  GLUCOSE 106* 107* 128* 166* 192* 172* 238*  BUN 14 18 18 17 16 12 14   CREATININE 0.72 0.94 0.95 0.87 0.85 0.82 0.84  CALCIUM  6.6* 8.2* 8.0* 8.0* 8.3* 8.4* 8.3*  MG 1.8 2.2  --   --   --   --   --     Liver Function Tests:  Recent Labs Lab 09/15/16 0630 09/16/16 0400 09/17/16 0500  AST 25 25 21   ALT 50 48 35  ALKPHOS 40 52 51  BILITOT 0.4 0.5 0.8  PROT 4.6* 5.8* 5.8*  ALBUMIN 2.1* 2.7* 2.5*   No results for input(s): LIPASE, AMYLASE in the last 168 hours. No results for input(s): AMMONIA in the last 168 hours.  CBC:  Recent Labs Lab 09/15/16 0400 09/16/16 0400 09/17/16 1041 09/18/16 0845 09/19/16 0353 09/20/16 0428 09/21/16 0437  WBC 8.7 12.0* 14.0* 11.5* 12.4* 11.9* 12.3*  NEUTROABS 6.2 9.3*  --   --   --   --   --   HGB 8.6* 9.0* 9.6* 8.9* 8.8* 9.1* 9.1*  HCT 25.5* 27.7* 29.1* 27.2* 27.0* 28.3* 28.1*  MCV 92.4 93.9 93.6 93.2 92.5 92.2 93.7  PLT 138* 206 240 243 244 293 261    INR:  Recent Labs Lab 09/18/16 0845 09/19/16 0353 09/20/16 0428 09/20/16 1516 09/21/16 0437  INR 1.74 1.66 1.88 1.80  2.02    Other results:    Imaging: No results found.   Medications:     Scheduled Medications: . sodium chloride   Intravenous Once  . sodium chloride   Intravenous Once  . amiodarone  400 mg Oral BID  . aspirin EC  81 mg Oral Daily  . atorvastatin  20 mg Oral QHS  . bisacodyl  10 mg Oral Daily  . dextromethorphan-guaiFENesin  1 tablet Oral BID  . digoxin  0.125 mg Oral Daily  . docusate sodium  200 mg Oral Daily  . escitalopram  20 mg Oral Daily  . feeding supplement (GLUCERNA SHAKE)  237 mL Oral BID BM  . furosemide  40 mg Intravenous BID  . insulin aspart  0-15 Units Subcutaneous TID WC  . insulin aspart  0-5 Units Subcutaneous QHS  . insulin detemir  10 Units Subcutaneous BID  . magnesium oxide  400 mg Oral Daily  . mouth rinse  15 mL Mouth Rinse BID  . multivitamin with minerals  1 tablet Oral Daily  . pantoprazole  40 mg Oral Daily  . pregabalin  75 mg Oral Daily  . spironolactone  25 mg Oral Daily  . warfarin   Does not apply Once  . Warfarin - Pharmacist  Dosing Inpatient   Does not apply q1800    Infusions: . sodium chloride Stopped (09/11/16 1000)  . sodium chloride    . sodium chloride 10 mL/hr at 09/17/16 2052  . lactated ringers Stopped (09/12/16 1300)  . lactated ringers 10 mL/hr (09/10/16 2030)  . milrinone 0.375 mcg/kg/min (09/21/16 0409)    PRN Medications: sodium chloride, albumin human, albuterol, fentaNYL (SUBLIMAZE) injection, Melatonin, ondansetron (ZOFRAN) IV, oxyCODONE, senna, sodium chloride flush, sodium chloride flush, sorbitol   Assessment/Plan/Discussion    1. 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  Echo 08/24/16 LVEF 15%, RV mild dilated, moderately reduced.  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 On coumadin INR 1.88. Continue aspirin 325 mg daily until INR low threshold of goal (INR goal 2-2.5)  - Volume status elevated. Increase lasix to 80 mg twice a day.   - CO-OX 51% despite increase in milrinone to 0.375 mcg.  -Dig level elevated. Cut digoxin back to 0.125 mg daily.  - VAD parameters stable.  - Continue to mobilize as tolerated.  2. AKI on CKD III: - Stable.   3. Hypercarbic respiratory failure - Improved 4. CAD: No CP. 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  5. DMII: Hgb A1C 8.3 on 08/26/2016  - Cover with SSI  6. RLE cellulitis: Completed 8 days of Vancomycin.  7. Tobacco abuse 8. Hypokalemia:  - Continue to supp as needed.   - Continue spiro.  9. Atrial fibrillation - back in NSR on amio. Will continue 10. Pocket pain - continue Lyrica. Can titrate as needed  Ramp ECHO today . Continue to mobilize.  Possible D/C 24-48 hours. [facility] for home milrinone.   I reviewed the LVAD parameters from today, and compared the results to the patient's prior recorded data.  No programming changes were made.  The LVAD is functioning within specified parameters.  The  patient performs LVAD self-test daily.  LVAD interrogation was negative for any significant power changes, alarms or PI events/speed drops.  LVAD equipment check completed and is in good working order.  Back-up equipment present.   LVAD education done on  emergency procedures and precautions and reviewed exit site care.  Length of Stay: [place] NP-C 09/21/2016, 7:56 AM  VAD Team --- VAD ISSUES ONLY--- Pager [phone] (7am - 7am)  Advanced Heart Failure Team  Pager [phone] (M-F; 7a - 4p)  Please contact [facility] for night-coverage after hours (4p -7a ) and weekends on [email or web address]  Patient seen and examined with Darrick Grinder, NP. We discussed all aspects of the encounter. I agree with the assessment and plan as stated above.   Continues to improve. Needs more diuresis. Continue IV lasix. Add a dose of metolazone. For ramp echo today. Co-ox remains low. Will continue milrinone.  Hopefully co-ox will improve with adjusting VAD speed. VAD education complete.   Chaitanya Amedee,MD 10:46 AM

## 2016-09-22 ENCOUNTER — Inpatient Hospital Stay (HOSPITAL_COMMUNITY): Payer: Medicare HMO

## 2016-09-22 DIAGNOSIS — J90 Pleural effusion, not elsewhere classified: Secondary | ICD-10-CM

## 2016-09-22 LAB — GLUCOSE, CAPILLARY
GLUCOSE-CAPILLARY: 222 mg/dL — AB (ref 65–99)
Glucose-Capillary: 178 mg/dL — ABNORMAL HIGH (ref 65–99)
Glucose-Capillary: 129 mg/dL — ABNORMAL HIGH (ref 65–99)
Glucose-Capillary: 221 mg/dL — ABNORMAL HIGH (ref 65–99)

## 2016-09-22 LAB — BASIC METABOLIC PANEL
Anion gap: 3 — ABNORMAL LOW (ref 5–15)
BUN: 15 mg/dL (ref 6–20)
CHLORIDE: 91 mmol/L — AB (ref 101–111)
CO2: 36 mmol/L — AB (ref 22–32)
Calcium: 8.2 mg/dL — ABNORMAL LOW (ref 8.9–10.3)
Creatinine, Ser: 0.89 mg/dL (ref 0.61–1.24)
GFR calc non Af Amer: >60 mL/min (ref >60)
Glucose, Bld: 178 mg/dL — ABNORMAL HIGH (ref 65–99)
POTASSIUM: 3.7 mmol/L (ref 3.5–5.1)
SODIUM: 130 mmol/L — AB (ref 135–145)

## 2016-09-22 LAB — CBC
HEMATOCRIT: 27.2 % — AB (ref 39.0–52.0)
Hemoglobin: 8.9 g/dL — ABNORMAL LOW (ref 13.0–17.0)
MCH: 29.9 pg (ref 26.0–34.0)
MCHC: 32.7 g/dL (ref 30.0–36.0)
MCV: 91.3 fL (ref 78.0–100.0)
Platelets: 282 10*3/uL (ref 150–400)
RBC: 2.98 MIL/uL — AB (ref 4.22–5.81)
RDW: 15 % (ref 11.5–15.5)
WBC: 12.1 10*3/uL — AB (ref 4.0–10.5)

## 2016-09-22 LAB — PROTIME-INR
INR: 2.44
Prothrombin Time: 26.9 seconds — ABNORMAL HIGH (ref 11.4–15.2)

## 2016-09-22 LAB — COOXEMETRY PANEL
Carboxyhemoglobin: 1.6 % — ABNORMAL HIGH (ref 0.5–1.5)
Methemoglobin: 0.8 % (ref 0.0–1.5)
O2 Saturation: 48.2 %
Total hemoglobin: 14.2 g/dL (ref 12.0–16.0)

## 2016-09-22 LAB — LACTATE DEHYDROGENASE: LDH: 219 U/L — ABNORMAL HIGH (ref 98–192)

## 2016-09-22 LAB — MAGNESIUM: Magnesium: 1.9 mg/dL (ref 1.7–2.4)

## 2016-09-22 MED ORDER — WARFARIN SODIUM 7.5 MG PO TABS: 7.5000 mg | ORAL_TABLET | Freq: Once | ORAL | Status: DC

## 2016-09-22 MED ORDER — AMIODARONE HCL 200 MG PO TABS
200.0000 mg | ORAL_TABLET | Freq: Two times a day (BID) | ORAL | Status: DC
  Administered 2016-09-22 – 2016-09-25 (×7): 200 mg via ORAL
  Filled 2016-09-22 (×7): qty 1

## 2016-09-22 MED ORDER — POTASSIUM CHLORIDE CRYS ER 20 MEQ PO TBCR
40.0000 meq | EXTENDED_RELEASE_TABLET | Freq: Every day | ORAL | Status: DC
  Administered 2016-09-22 – 2016-09-25 (×4): 40 meq via ORAL
  Filled 2016-09-22 (×5): qty 2

## 2016-09-22 NOTE — Progress Notes (Signed)
Patient ID: Ronald Duryee., male   DOB: 1955-12-10, 60 y.o.   MRN: [id number]  HeartMate 2 Rounding Note  Subjective:    S/P HMII 09/09/2016   Remains on Milrinone  0.375. MAPs 70-90s.  INR 2.44.   Yesterday diuresed with IV lasix. Negative 1.6 liters. Weight down 3 pounds. Speed increased to 5900.   CO-OX 48%   Mild dyspnea with exertion.   LVAD INTERROGATION:  HeartMate III LVAD:  Flow 5.7 liters/min, speed 5950, power 4.,6, PI 1.9 Rare PI  Objective:    Vital Signs:   Temp:  [98 F (36.7 C)-98.4 F (36.9 C)] 98 F (36.7 C) (12/20 0540) Pulse Rate:  [60-77] 77 (12/20 0540) Resp:  [18-20] 20 (12/20 0540) BP: (82-102)/(42-84) 82/65 (12/20 0700) SpO2:  [91 %-98 %] 91 % (12/20 0540) Weight:  [225 lb 11.2 oz (102.4 kg)] 225 lb 11.2 oz (102.4 kg) (12/20 0540) Last BM Date: 09/21/16 Mean arterial Pressure 70-90s  Intake/Output:   Intake/Output Summary (Last 24 hours) at 09/22/16 0731 Last data filed at 09/22/16 0601  Gross per 24 hour  Intake              620 ml  Output             2220 ml  Net            -1600 ml     Physical Exam: General:  In the chair  . NAD.  HEENT: normal Neck: supple. JVP ~10 .  Carotids 2+ bilat; no bruits. No thyromegaly or nodule noted. RIJ swan  Cor: Mechanical heart sounds with LVAD hum present. + CTs Lungs: RLL RML crackles. LLL decreased   Abdomen: soft, NT, +distended, no HSM. No bruits or masses. +BS  Driveline: C/D/I; securement device intact and driveline incorporated Extremities: no cyanosis, clubbing, rash, R and LLE 1+ edema Neuro: Awake and oriented. Moves all 4 extremities on command and without difficulty.    Telemetry: Reviewed, V-paced 70s  Labs: Basic Metabolic Panel:  Recent Labs Lab 09/16/16 0400  09/18/16 0845 09/19/16 0353 09/20/16 0428 09/21/16 0437 09/22/16 0602  NA 135  < > 131* 130* 130* 130* 130*  K 4.6  < > 4.3 3.9 3.9 3.7 3.7  CL 96*  < > 95* 92* 92* 90* 91*  CO2 33*  < > 29 28 31  34* 36*    GLUCOSE 107*  < > 166* 192* 172* 238* 178*  BUN 18  < > 17 16 12 14 15   CREATININE 0.94  < > 0.87 0.85 0.82 0.84 0.89  CALCIUM 8.2*  < > 8.0* 8.3* 8.4* 8.3* 8.2*  MG 2.2  --   --   --   --   --  1.9  < > = values in this interval not displayed.  Liver Function Tests:  Recent Labs Lab 09/16/16 0400 09/17/16 0500  AST 25 21  ALT 48 35  ALKPHOS 52 51  BILITOT 0.5 0.8  PROT 5.8* 5.8*  ALBUMIN 2.7* 2.5*   No results for input(s): LIPASE, AMYLASE in the last 168 hours. No results for input(s): AMMONIA in the last 168 hours.  CBC:  Recent Labs Lab 09/16/16 0400  09/18/16 0845 09/19/16 0353 09/20/16 0428 09/21/16 0437 09/22/16 0602  WBC 12.0*  < > 11.5* 12.4* 11.9* 12.3* 12.1*  NEUTROABS 9.3*  --   --   --   --   --   --   HGB 9.0*  < > 8.9* 8.8* 9.1*  9.1* 8.9*  HCT 27.7*  < > 27.2* 27.0* 28.3* 28.1* 27.2*  MCV 93.9  < > 93.2 92.5 92.2 93.7 91.3  PLT 206  < > 243 244 293 261 282  < > = values in this interval not displayed.  INR:  Recent Labs Lab 09/19/16 0353 09/20/16 0428 09/20/16 1516 09/21/16 0437 09/22/16 0602  INR 1.66 1.88 1.80 2.02 2.44    Other results:    Imaging: Dg Chest 2 View  Result Date: 09/21/2016 CLINICAL DATA:  Ten days status post left ventricular assist device placement. Persistent chest soreness but no other complaints. Follow-up radiograph. EXAM: CHEST  2 VIEW COMPARISON:  PA and lateral chest x-ray of September 17, 2016 FINDINGS: The right lung is well-expanded and clear. On the left the retrocardiac region remains dense. There is a small amount of pleural fluid at the layering posteriorly on the left and a smaller amount layering posteriorly on the right. The cardiac silhouette remains enlarged. The pulmonary vascularity is not engorged. The left ventricular assist device is in stable position. The ICD is also in stable position. The sternal wires are intact. The thoracic spine exhibits multilevel degenerative disc space narrowing.  IMPRESSION: Stable appearance of the chest since the study of 3 days ago. No significant pulmonary edema. Persistent small left pleural effusion and trace right pleural effusion. Basilar atelectasis or pneumonia. The ICD and left ventricular assist device appear to be in stable position. Electronically Signed   By: David  Martinique M.D.   On: 09/21/2016 08:10     Medications:     Scheduled Medications: . sodium chloride   Intravenous Once  . sodium chloride   Intravenous Once  . amiodarone  400 mg Oral BID  . aspirin EC  81 mg Oral Daily  . atorvastatin  20 mg Oral QHS  . bisacodyl  10 mg Oral Daily  . dextromethorphan-guaiFENesin  1 tablet Oral BID  . digoxin  0.125 mg Oral Daily  . docusate sodium  200 mg Oral Daily  . escitalopram  20 mg Oral Daily  . feeding supplement (GLUCERNA SHAKE)  237 mL Oral BID BM  . furosemide  80 mg Intravenous BID  . insulin aspart  0-15 Units Subcutaneous TID WC  . insulin aspart  0-5 Units Subcutaneous QHS  . insulin detemir  10 Units Subcutaneous BID  . magnesium oxide  400 mg Oral Daily  . mouth rinse  15 mL Mouth Rinse BID  . multivitamin with minerals  1 tablet Oral Daily  . pantoprazole  40 mg Oral Daily  . pregabalin  75 mg Oral Daily  . spironolactone  25 mg Oral Daily  . warfarin   Does not apply Once  . Warfarin - Pharmacist Dosing Inpatient   Does not apply q1800    Infusions: . sodium chloride Stopped (09/11/16 1000)  . sodium chloride    . sodium chloride 10 mL/hr at 09/17/16 2052  . lactated ringers Stopped (09/12/16 1300)  . lactated ringers 10 mL/hr (09/10/16 2030)  . milrinone 0.375 mcg/kg/min (09/21/16 2127)    PRN Medications: sodium chloride, albumin human, albuterol, fentaNYL (SUBLIMAZE) injection, Melatonin, ondansetron (ZOFRAN) IV, oxyCODONE, senna, sodium chloride flush, sodium chloride flush, sorbitol   Assessment/Plan/Discussion    1. 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  Echo 08/24/16 LVEF 15%, RV mild dilated, moderately reduced.  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 On coumadin INR 2.44.  Cut aspirin back to 81 mg daily.   - Volume status elevated. Continue lasix to 80 mg twice a day. Give 40 meq potassium daily.  May need to add sildenafil. - CO-OX 48% despite milrinone to 0.375 mcg.  -Dig level elevated. Cut digoxin back to 0.125 mg daily.  - VAD parameters stable.  - Continue to mobilize as tolerated.  2. AKI on CKD III: - Stable.   3. Hypercarbic respiratory failure - Improved 4. CAD: No CP. 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  5. DMII: Hgb A1C 8.3 on 08/26/2016  - Cover with SSI  6. RLE cellulitis: Completed 8 days of Vancomycin.  7. Tobacco abuse 8. Hypokalemia:  - Continue to supp as needed.   - Continue spiro.  9. Atrial fibrillation - back in NSR on amio. Cut back amio to 200 mg twice a day.  10. Pocket pain - continue Lyrica. Can titrate as needed 11. Left pleural effusion - moderate on CXR. Hopefully will improve diuresis. Will repeat CXR in am. Dr. Prescott Gum aware  CXR in am   [facility] for home milrinone.   I reviewed the LVAD parameters from today, and compared the results to the patient's prior recorded data.  No programming changes were made.  The LVAD is functioning within specified parameters.  The patient performs LVAD self-test daily.  LVAD interrogation was negative for any significant power changes, alarms or PI events/speed drops.  LVAD equipment check completed and is in good working order.  Back-up equipment present.   LVAD education done on emergency procedures and precautions and reviewed exit site care.  Length of Stay: [place] NP-C 09/22/2016, 7:31 AM  VAD Team --- VAD ISSUES ONLY--- Pager [phone] (7am - 7am)  Advanced Heart Failure Team  Pager [phone] (M-F; 7a - 4p)  Please contact [facility]  for night-coverage after hours (4p -7a ) and weekends on [email or web address]  Patient seen and examined with Darrick Grinder, NP. We discussed all aspects of the encounter. I agree with the assessment and plan as stated above.   Improving with diuresis. Still volume overloaded. Continue diuresis. Co-ox remians low even after VAD speed adjusted but feels ok. Begin to wean milrinone. Has significant left pleural effusion. Hopefully will improve diuresis. Will repeat CXR in am. Dr. Prescott Gum aware. Continue to mobilize. Maintaining NSR on amio. VAD parameters ok.   Bensimhon, Daniel,MD 9:38 AM

## 2016-09-22 NOTE — Progress Notes (Signed)
13 Days Post-Op Procedure(s) (LRB): REDO STERNOTOMY (N/A) INSERTION OF IMPLANTABLE LEFT VENTRICULAR ASSIST DEVICE (N/A) TRANSESOPHAGEAL ECHOCARDIOGRAM (TEE) (N/A) Subjective: CT scan shows moderate left pleural effusion INR 2.4 We'll hold Coumadin and schedule for IR ultrasound-guided left thoracentesis when INR less than 2.0  Objective: Vital signs in last 24 hours: Temp:  [98 F (36.7 C)-98.4 F (36.9 C)] 98 F (36.7 C) (12/20 0540) Pulse Rate:  [60-77] 77 (12/20 0540) Cardiac Rhythm: Ventricular paced (12/20 0701) Resp:  [18-20] 20 (12/20 0540) BP: (82-102)/(42-84) 82/65 (12/20 0700) SpO2:  [91 %-98 %] 91 % (12/20 0540) Weight:  [225 lb 11.2 oz (102.4 kg)] 225 lb 11.2 oz (102.4 kg) (12/20 0540)  Hemodynamic parameters for last 24 hours:  stable  Intake/Output from previous day: 12/19 0701 - 12/20 0700 In: 620 [P.O.:600; I.V.:20] Out: 2220 [Urine:2220] Intake/Output this shift: No intake/output data recorded.  Surgical incisions well-healed Minimal edema Abdomen soft  Lab Results:  Recent Labs  09/21/16 0437 09/22/16 0602  WBC 12.3* 12.1*  HGB 9.1* 8.9*  HCT 28.1* 27.2*  PLT 261 282   BMET:  Recent Labs  09/21/16 0437 09/22/16 0602  NA 130* 130*  K 3.7 3.7  CL 90* 91*  CO2 34* 36*  GLUCOSE 238* 178*  BUN 14 15  CREATININE 0.84 0.89  CALCIUM 8.3* 8.2*    PT/INR:  Recent Labs  09/22/16 0602  LABPROT 26.9*  INR 2.44   ABG    Component Value Date/Time   PHART 7.390 09/11/2016 1338   HCO3 24.9 09/11/2016 1338   TCO2 33 09/14/2016 1618   ACIDBASEDEF 3.0 (H) 09/11/2016 0423   O2SAT 48.2 09/22/2016 0615   CBG (last 3)   Recent Labs  09/21/16 2047 09/22/16 0617 09/22/16 1123  GLUCAP 172* 178* 222*    Assessment/Plan: S/P Procedure(s) (LRB): REDO STERNOTOMY (N/A) INSERTION OF IMPLANTABLE LEFT VENTRICULAR ASSIST DEVICE (N/A) TRANSESOPHAGEAL ECHOCARDIOGRAM (TEE) (N/A) Moderate left pleural effusion postop HeartMate 3 implant We'll  request IR ultrasound-guided thoracentesis when INR less than 2.0 Patient will not need heparin at this time since INR 2.4   LOS: 31 days    Ronald Miller 09/22/2016

## 2016-09-22 NOTE — Progress Notes (Signed)
Called patient's sister and left Voicemail requesting callback and need for them to come to hospital and perform more VAD dressing changes with the coordinator or bedside nursing team so they can feel comfortable with the procedure. I have discussed plan with nursing staff today to assist with reinforcing this important and very necessary skill.  Planning on having [facility] and provide assistance and guidance in the home as well. We will have to teach a team of their nursing staff to assist with continuity of procedure.   [name], RN VAD Coordinator   Office: [phone] 24/7 Emergency VAD Pager: [phone]

## 2016-09-22 NOTE — Progress Notes (Signed)
CARDIAC REHAB PHASE I   PRE:  Rate/Rhythm: 77 pacing    BP: sitting 74 MAP    SaO2: 92 RA  MODE:  Ambulation: 150 ft   POST:  Rate/Rhythm: 81 pacing    BP: sitting 88 MAP    SaO2: 93 RA   Pt feels bad, sore. Reluctant to walk. Used rollator and assist x2 for supervision for short walk. Refused walking farther. Return to recliner. He was able to stand independently but wanted to use his arms to sit back. Encouraged more walking. [id number]  [place], ACSM 09/22/2016 11:47 AM

## 2016-09-22 NOTE — Progress Notes (Addendum)
[place] for Coumadin Indication: LVAD  Allergies  Allergen Reactions  . Codeine Nausea And Vomiting  . Lipitor [Atorvastatin] Nausea Only    Nausea with high doses, tolerates 20mg  dose (08/22/16)    Patient Measurements: Height: 5\' 9"  (175.3 cm) Weight: 225 lb 11.2 oz (102.4 kg) IBW/kg (Calculated) : 70.7 Heparin Dosing Weight: n/a  Vital Signs: Temp: 98 F (36.7 C) (12/20 0540) Temp Source: Oral (12/20 0540) BP: 82/65 (12/20 0700) Pulse Rate: 77 (12/20 0540)  Labs:  Recent Labs  09/20/16 0428 09/20/16 1516 09/21/16 0437 09/22/16 0602  HGB 9.1*  --  9.1* 8.9*  HCT 28.3*  --  28.1* 27.2*  PLT 293  --  261 282  APTT 75*  --   --   --   LABPROT 21.9* 21.1* 23.1* 26.9*  INR 1.88 1.80 2.02 2.44  CREATININE 0.82  --  0.84 0.89    Estimated Creatinine Clearance: 105.4 mL/min (by C-G formula based on SCr of 0.89 mg/dL).  Assessment: 60 yo male s/p HM3 LVAD 09/09/16.  Coumadin started by TCTS on 12/8, bivalirudin added 12/11 (TCTS managing). Pharmacy asked to begin managing Coumadin dosing.   Coumadin dose missed 12/12, INR continues to trend up to 2.44 this am off bival. Will reduce dose to 5mg  tonight  Goal of Therapy:  INR 2-2.5 Monitor platelets by anticoagulation protocol: Yes   Plan:  1. Coumadin 7.5g po x 1 tonight. 3. Daily PT/INR. 4. Coumadin education today  Erin Hearing PharmD., BCPS Clinical Pharmacist Pager [phone] 09/22/2016 12:11 PM

## 2016-09-22 NOTE — Progress Notes (Signed)
Ronald Miller. has been discussed with the VAD Medical Review board on 08/30/2016. The team feels as if the patient is an adequate candidate for Destination LVAD therapy. The patient meets criteria for a LVAD implant as listed below:  1)  Has failed to respond to optimal medical management (including beta-blockers and ACE inhibitors if tolerated) for at least 45 of the last 60 days, or have been balloon dependent for 7 days, or IV inotrope dependent for 14 days; and,       *On Inotropes Milrinone infusion started June 207 with recent increase to 0.8mg/kg/min 10/26/2015  2)  Has a left ventricular ejection fraction (LVEF) < 25% and, have demonstrated functional limitation with a peak oxygen consumption of <14 ml/kg/min unless balloon pump or inotrope dependent or physically unable to perform the test.         *EF 15% on echo 08/24/2016            *CPX (date)  pVO2 ____ RER_____ VE/VCo2 slope_____ *unable to complete d/t milrinone  3)  Social work and palliative care evaluations demonstrate appropriate support system in place for discharge to home with a VAD and that end of life discussions have taken place. Both services have expressed no concern regarding patient's candidacy.         *Social work consult (date): 08/30/2016 - Ronald Miller Ronald Miller(date): 09/01/2016 - Ronald Million Ronald Miller  4)  Primary caretaker identified that can be taught along with the patient how to manage        the VAD equipment.        *Name: Ronald Miller(sister) and Ronald Miller(mother)  5)  Deemed appropriate by our financial coordinator: 08/30/2016        Prior approval: 09/07/2016 - HeartMate 3 approved by payor for anticipated BTT indication as he was in the middle of a work up at [employer]   6)  VAD Coordinator, Ronald Madeirahas met with patient (caregivers have seen equipment in discussions with LCSW but unavailable to meet pre-op with Coordinator), shown him the VAD equipment and discussed  with the patient and caregiver about lifestyle changes necessary for success on mechanical circulatory device.        *Met with Ronald Miller on September 03, 2016.       *Consent for VAD Evaluation/Caregiver Agreement/HIPPA Release/Photo Release signed on 08/30/2016  7)  Patient has been discussed with Dr. JHoyt Miller Ronald Miller program. After a detailed review of the patient's medical record and psychosocial history and discussion of the patient's current medical condition it was concluded that Ronald Miller a current candidate for heart transplantation due to tobacco smoking within the last 12 months and needs 6 months of substance abuse counseling.    8)  Intermacs profile: 2  INTERMACS 1: Critical cardiogenic shock describes a patient who is "crashing and burning", in which a patient has life-threatening hypotension and rapidly escalating inotropic pressor support, with critical organ hypoperfusion often confirmed by worsening acidosis and lactate levels.  INTERMACS 2: Progressive decline describes a patient who has been demonstrated "dependent" on inotropic support but nonetheless shows signs of continuing deterioration in nutrition, renal function, fluid retention, or other major status indicator. Patient profile 2 can also describe a patient with refractory volume overload, perhaps with evidence of impaired perfusion, in whom inotropic infusions cannot be maintained due to tachyarrhythmias, clinical ischemia, or other intolerance.  INTERMACS 3: Stable  but inotrope dependent describes a patient who is clinically stable on mild-moderate doses of intravenous inotropes (or has a temporary circulatory support device) after repeated documentation of failure to wean without symptomatic hypotension, worsening symptoms, or progressive organ dysfunction (usually renal). It is critical to monitor nutrition, renal function, fluid balance, and overall status carefully in order to  distinguish between a  patient who is truly stable at Patient Profile 3 and a patient who has unappreciated decline rendering them Patient Profile 2. This patient may be either at home or in the hospital.   INTERMACS 4: Resting symptoms describes a patient who is at home on oral therapy but frequently has symptoms of congestion at rest or with activities of daily living (ADL). He or she may have orthopnea, shortness of breath during ADL such as dressing or bathing, gastrointestinal symptoms (abdominal discomfort, nausea, poor appetite), disabling ascites or severe lower extremity edema. This patient should be carefully considered for more intensive management and surveillance programs, which may in some cases, reveal poor compliance that would compromise outcomes with any therapy.  .  INTERMACS 5: Exertion Intolerant describes a patient who is comfortable at rest but unable to engage in any activity, living predominantly within the house or housebound. This patient has no congestive symptoms, but may have chronically elevated volume status, frequently with renal dysfunction, and may be characterized as exercise intolerant.   INTERMACS 6: Exertion Limited also describes a patient who is comfortable at rest without evidence of fluid overload, but who is able to do some mild activity. Activities of daily living are comfortable and minor activities outside the home such as visiting friends or going to a restaurant can be performed, but fatigue results within a few minutes of any meaningful physical exertion. This patient has occasional episodes of worsening symptoms and is likely to have had a hospitalization for heart failure within the past year.   INTERMACS 7: Advanced NYHA Class 3 describes a patient who is clinically stable with a reasonable level of comfortable activity, despite history of previous decompensation that is not recent. This patient is usually able to walk more than a block. Any  decompensation requiring intravenous diuretics or hospitalization within the previous month should make this person a Patient Profile 6 or lower.    9)  NYHA Class: IV    LVAD implant scheduled Thursday 09/09/2016 with Dr. Tharon Aquas Trigt.   [name], RN VAD Coordinator   Office: [phone] 24/7 Emergency VAD Pager: [phone]  Agree with above. Patient discussed extensively with our Medical Review Board and with Dr. Hoyt Koch at [facility]. He meets all criteria as above.   Bensimhon, Daniel,MD 11:28 PM

## 2016-09-22 NOTE — Progress Notes (Signed)
CSW met with patient at bedside. Patient states he has multiple tests today and reports he still is not feeling any improvement with the VAD. Patient spoke about frustrations with long hospitalization and hopes for improved health. CSW provided supportive intervention and will continue to follow for support throughout hospitalization and outpatient VAD clinic. Jackie , LCSW [phone]   

## 2016-09-23 ENCOUNTER — Inpatient Hospital Stay (HOSPITAL_COMMUNITY): Payer: Medicare HMO

## 2016-09-23 ENCOUNTER — Telehealth: Payer: Self-pay | Admitting: Licensed Clinical Social Worker

## 2016-09-23 LAB — LACTATE DEHYDROGENASE: LDH: 205 U/L — ABNORMAL HIGH (ref 98–192)

## 2016-09-23 LAB — GLUCOSE, CAPILLARY
GLUCOSE-CAPILLARY: 227 mg/dL — AB (ref 65–99)
Glucose-Capillary: 131 mg/dL — ABNORMAL HIGH (ref 65–99)
Glucose-Capillary: 167 mg/dL — ABNORMAL HIGH (ref 65–99)
Glucose-Capillary: 161 mg/dL — ABNORMAL HIGH (ref 65–99)

## 2016-09-23 LAB — CBC
HCT: 27.6 % — ABNORMAL LOW (ref 39.0–52.0)
Hemoglobin: 8.8 g/dL — ABNORMAL LOW (ref 13.0–17.0)
MCH: 29.5 pg (ref 26.0–34.0)
MCHC: 31.9 g/dL (ref 30.0–36.0)
MCV: 92.6 fL (ref 78.0–100.0)
PLATELETS: 266 10*3/uL (ref 150–400)
RBC: 2.98 MIL/uL — AB (ref 4.22–5.81)
RDW: 15.2 % (ref 11.5–15.5)
WBC: 10.7 10*3/uL — ABNORMAL HIGH (ref 4.0–10.5)

## 2016-09-23 LAB — PROTIME-INR
INR: 2.41
PROTHROMBIN TIME: 26.7 s — AB (ref 11.4–15.2)

## 2016-09-23 LAB — BASIC METABOLIC PANEL
Anion gap: 8 (ref 5–15)
BUN: 16 mg/dL (ref 6–20)
CO2: 34 mmol/L — AB (ref 22–32)
CREATININE: 0.85 mg/dL (ref 0.61–1.24)
Calcium: 8.3 mg/dL — ABNORMAL LOW (ref 8.9–10.3)
Chloride: 91 mmol/L — ABNORMAL LOW (ref 101–111)
GFR calc non Af Amer: >60 mL/min (ref >60)
Glucose, Bld: 132 mg/dL — ABNORMAL HIGH (ref 65–99)
Potassium: 3.8 mmol/L (ref 3.5–5.1)
Sodium: 133 mmol/L — ABNORMAL LOW (ref 135–145)

## 2016-09-23 LAB — GRAM STAIN

## 2016-09-23 LAB — COOXEMETRY PANEL
Carboxyhemoglobin: 2 % — ABNORMAL HIGH (ref 0.5–1.5)
Methemoglobin: 0.9 % (ref 0.0–1.5)
O2 Saturation: 60.1 %
Total hemoglobin: 9.8 g/dL — ABNORMAL LOW (ref 12.0–16.0)

## 2016-09-23 LAB — BRAIN NATRIURETIC PEPTIDE: B Natriuretic Peptide: 504.1 pg/mL — ABNORMAL HIGH (ref 0.0–100.0)

## 2016-09-23 MED ORDER — LIDOCAINE HCL (PF) 1 % IJ SOLN
INTRAMUSCULAR | Status: AC
  Filled 2016-09-23: qty 10

## 2016-09-23 MED ORDER — MILRINONE LACTATE IN DEXTROSE 20-5 MG/100ML-% IV SOLN
0.1250 ug/kg/min | INTRAVENOUS | Status: DC
  Administered 2016-09-23: 0.125 ug/kg/min via INTRAVENOUS
  Filled 2016-09-23: qty 100

## 2016-09-23 MED ORDER — SORBITOL 70 % SOLN
45.0000 mL | Freq: Every morning | Status: AC
  Administered 2016-09-24: 45 mL via ORAL
  Filled 2016-09-23 (×2): qty 60

## 2016-09-23 MED ORDER — METOLAZONE 5 MG PO TABS
5.0000 mg | ORAL_TABLET | Freq: Every day | ORAL | Status: AC
  Administered 2016-09-23 – 2016-09-24 (×2): 5 mg via ORAL
  Filled 2016-09-23 (×2): qty 1

## 2016-09-23 MED ORDER — WARFARIN SODIUM 7.5 MG PO TABS
7.5000 mg | ORAL_TABLET | Freq: Once | ORAL | Status: AC
  Administered 2016-09-23: 7.5 mg via ORAL
  Filled 2016-09-23: qty 1

## 2016-09-23 MED ORDER — WARFARIN - PHARMACIST DOSING INPATIENT: Freq: Every day | Status: DC

## 2016-09-23 NOTE — Telephone Encounter (Signed)
CSW received return call from patient's sister Ronald Miller. She states she feels confident that she and her Mom are trained to care for Ronald Miller. She reported that she met with VAD Coordinator twice and also completed dressing changes with RN staff. Ronald Miller states she is ready and hopeful for discharge home tomorrow if possible. Family will be at bedside by 4pm tomorrow to review any needs for discharge. CSW informed VAD Coordinator of phone call from sister. CSW available as needed. Raquel Sarna, LCSW [phone]

## 2016-09-23 NOTE — Progress Notes (Signed)
Pt adamantly refused walking this pm. Sts he just got pain meds. Could not coerce. Will f/u tomorrow. Yves Dill CES, ACSM 2:17 PM 09/23/2016

## 2016-09-23 NOTE — Procedures (Signed)
Successful US guided diagnostic and therapeutic thoracentesis. Yielded 640 mL of amber fluid. Procedure was stopped due to hypotension. No immediate complications.  Specimen was sent for labs. CXR ordered.  Docia Barrier PA-C 09/23/2016 11:48 AM

## 2016-09-23 NOTE — Progress Notes (Addendum)
Addendum: Patient s/p thoracentesis this morning yeilding 663ml of amber fluid. No complications noted. D/w Bensimhon and will restart warfarin tonight and give 7.5mg .   [place] for Coumadin Indication: LVAD  Allergies  Allergen Reactions  . Codeine Nausea And Vomiting  . Lipitor [Atorvastatin] Nausea Only    Nausea with high doses, tolerates 20mg  dose (08/22/16)    Patient Measurements: Height: 5\' 9"  (175.3 cm) Weight: 228 lb 8 oz (103.6 kg) IBW/kg (Calculated) : 70.7 Heparin Dosing Weight: n/a  Vital Signs: Temp: 97.1 F (36.2 C) (12/21 0500) Temp Source: Oral (12/21 0500) BP: 90/72 (12/21 0808) Pulse Rate: 75 (12/21 1016)  Labs:  Recent Labs  09/21/16 0437 09/22/16 0602 09/23/16 0534  HGB 9.1* 8.9* 8.8*  HCT 28.1* 27.2* 27.6*  PLT 261 282 266  LABPROT 23.1* 26.9* 26.7*  INR 2.02 2.44 2.41  CREATININE 0.84 0.89 0.85    Estimated Creatinine Clearance: 111 mL/min (by C-G formula based on SCr of 0.85 mg/dL).  Assessment: 60 yo male s/p HM3 LVAD 09/09/16.  Coumadin started by TCTS on 12/8, bivalirudin added 12/11 (TCTS managing). Pharmacy asked to begin managing Coumadin dosing.   Coumadin dose missed 12/12, held 12/20, now in need of thoracentesis when INR down <2. Continue to hold warfarin for now. When INR gets closer to 1.8>2 will address need for heparin bridge.  Goal of Therapy:  INR 2-2.5 Monitor platelets by anticoagulation protocol: Yes   Plan:  1. Hold coumadin for now 3. Daily PT/INR.  Erin Hearing PharmD., BCPS Clinical Pharmacist Pager [phone] 09/23/2016 11:23 AM

## 2016-09-23 NOTE — Progress Notes (Signed)
CSW met briefly with patient who was getting ready to go for a procedure. Patient looking forward to discharge home. CSW attempted to contact patient's sister and mother who are primary caregivers although no answer with either and message left. CSW followed up with VAD Coordinator [name], RN to inform of message left for family. CSW available as needed and will follow up with patient through the outpatient VAD clinic. Raquel Sarna, LCSW [phone]

## 2016-09-23 NOTE — Progress Notes (Signed)
OT Cancellation Note  Patient Details Name: Ronald Miller. MRN: [id number] DOB: Feb 04, 1956   Cancelled Treatment:    Reason Eval/Treat Not Completed: Medical issues which prohibited therapy - thorecentesis.  [place], OTR/L [id number]   Lucille Passy M 09/23/2016, 2:10 PM

## 2016-09-23 NOTE — Discharge Instructions (Addendum)
Continue LVAD instructions.   INR check on the 27th with appt.   Call for shortness of breath or any problems.     Information on my medicine - Coumadin   (Warfarin)  This medication education was reviewed with me or my healthcare representative as part of my discharge preparation.  The pharmacist that spoke with me during my hospital stay was:  Georgina Peer, [facility]  Why was Coumadin prescribed for you? Coumadin was prescribed for you because you have a blood clot or a medical condition that can cause an increased risk of forming blood clots. Blood clots can cause serious health problems by blocking the flow of blood to the heart, lung, or brain. Coumadin can prevent harmful blood clots from forming. As a reminder your indication for Coumadin is:   Blood Clot Prevention After Heart Pump Surgery  What test will check on my response to Coumadin? While on Coumadin (warfarin) you will need to have an INR test regularly to ensure that your dose is keeping you in the desired range. The INR (international normalized ratio) number is calculated from the result of the laboratory test called prothrombin time (PT).  If an INR APPOINTMENT HAS NOT ALREADY BEEN MADE FOR YOU please schedule an appointment to have this lab work done by your health care provider within 7 days. Your INR goal is a number between 2.0 - 2.5 What  do you need to  know  About  COUMADIN? Take Coumadin (warfarin) exactly as prescribed by your healthcare provider about the same time each day.  DO NOT stop taking without talking to the doctor who prescribed the medication.  Stopping without other blood clot prevention medication to take the place of Coumadin may increase your risk of developing a new clot or stroke.  Get refills before you run out.  What do you do if you miss a dose? If you miss a dose, take it as soon as you remember on the same day then continue your regularly scheduled regimen the next day.  Do not take two doses  of Coumadin at the same time.  Important Safety Information A possible side effect of Coumadin (Warfarin) is an increased risk of bleeding. You should call your healthcare provider right away if you experience any of the following: ? Bleeding from an injury or your nose that does not stop. ? Unusual colored urine (red or dark brown) or unusual colored stools (red or black). ? Unusual bruising for unknown reasons. ? A serious fall or if you hit your head (even if there is no bleeding).  Some foods or medicines interact with Coumadin (warfarin) and might alter your response to warfarin. To help avoid this: ? Eat a balanced diet, maintaining a consistent amount of Vitamin K. ? Notify your provider about major diet changes you plan to make. ? Avoid alcohol or limit your intake to 1 drink for women and 2 drinks for men per day. (1 drink is 5 oz. wine, 12 oz. beer, or 1.5 oz. liquor.)  Make sure that ANY health care provider who prescribes medication for you knows that you are taking Coumadin (warfarin).  Also make sure the healthcare provider who is monitoring your Coumadin knows when you have started a new medication including herbals and non-prescription products.  Coumadin (Warfarin)  Major Drug Interactions  Increased Warfarin Effect Decreased Warfarin Effect  Alcohol (large quantities) Antibiotics (esp. Septra/Bactrim, Flagyl, Cipro) Amiodarone (Cordarone) Aspirin (ASA) Cimetidine (Tagamet) Megestrol (Megace) NSAIDs (ibuprofen, naproxen, etc.) Piroxicam (  Feldene) °Propafenone (Rythmol SR) °Propranolol (Inderal) °Isoniazid (INH) °Posaconazole (Noxafil) Barbiturates (Phenobarbital) °Carbamazepine (Tegretol) °Chlordiazepoxide (Librium) °Cholestyramine (Questran) °Griseofulvin °Oral Contraceptives °Rifampin °Sucralfate (Carafate) °Vitamin K  ° °Coumadin® (Warfarin) Major Herbal Interactions  °Increased Warfarin Effect Decreased Warfarin Effect  °Garlic °Ginseng °Ginkgo biloba Coenzyme  Q10 °Green tea °St. John’s wort   ° °Coumadin® (Warfarin) FOOD Interactions  °Eat a consistent number of servings per week of foods HIGH in Vitamin K °(1 serving = ½ cup)  °Collards (cooked, or boiled & drained) °Kale (cooked, or boiled & drained) °Mustard greens (cooked, or boiled & drained) °Parsley *serving size only = ¼ cup °Spinach (cooked, or boiled & drained) °Swiss chard (cooked, or boiled & drained) °Turnip greens (cooked, or boiled & drained)  °Eat a consistent number of servings per week of foods MEDIUM-HIGH in Vitamin K °(1 serving = 1 cup)  °Asparagus (cooked, or boiled & drained) °Broccoli (cooked, boiled & drained, or raw & chopped) °Brussel sprouts (cooked, or boiled & drained) *serving size only = ½ cup °Lettuce, raw (green leaf, endive, romaine) °Spinach, raw °Turnip greens, raw & chopped  ° °These websites have more information on Coumadin (warfarin):  www.coumadin.com; °www.ahrq.gov/consumer/coumadin.htm; ° ° ° °

## 2016-09-23 NOTE — Progress Notes (Signed)
PT Cancellation Note  Patient Details Name: Ronald Miller. MRN: [id number] DOB: March 26, 1956   Cancelled Treatment:    Reason Eval/Treat Not Completed: Other (comment) (pt requesting to finish his breakfast and then return for therapy. Will attempt again this AM)   [name] 09/23/2016, 9:23 AM  Elwyn Reach, [place]

## 2016-09-23 NOTE — Progress Notes (Signed)
Patient ID: Ronald Miller., male   DOB: 22-Jan-1956, 60 y.o.   MRN: [id number]  HeartMate 2 Rounding Note  Subjective:    S/P HMII 09/09/2016   Milrinone turned down to 0.25 yesterday.  Co-ox now 60%. MAPs 70-90s.  INR 2.44.   Yesterday diuresed with IV lasix. Weight actually up 3 pounds. CT chest with large left pleural effusion pending thoracentesis when INR down. Coumadin on hold.     LVAD INTERROGATION:  HeartMate III LVAD:  Flow 5.5 liters/min, speed 5950, power 5.0, PI 2.1 Rare PI  Objective:    Vital Signs:   Temp:  [97.1 F (36.2 C)-98.4 F (36.9 C)] 97.1 F (36.2 C) (12/21 0500) Pulse Rate:  [77-80] 77 (12/21 0500) Resp:  [18] 18 (12/21 0500) BP: (90-103)/(72-85) 90/72 (12/21 0808) SpO2:  [91 %-99 %] 91 % (12/21 0500) Weight:  [103.6 kg (228 lb 8 oz)] 103.6 kg (228 lb 8 oz) (12/21 0500) Last BM Date: 09/21/16 Mean arterial Pressure 70-90s  Intake/Output:   Intake/Output Summary (Last 24 hours) at 09/23/16 0854 Last data filed at 09/23/16 0809  Gross per 24 hour  Intake                0 ml  Output             2000 ml  Net            -2000 ml     Physical Exam: General:  In the chair  . NAD.  HEENT: normal Neck: supple. JVP ~8-9 .  Carotids 2+ bilat; no bruits. No thyromegaly or nodule noted. RIJ swan  Cor: Mechanical heart sounds with LVAD hum present. + CTs Lungs: R dull at base L dull 1/2 up   Abdomen: soft, NT, +distended, no HSM. No bruits or masses. +BS  Driveline: C/D/I; securement device intact and driveline incorporated Extremities: no cyanosis, clubbing, rash, R and LLE 1+ edema Neuro: Awake and oriented. Moves all 4 extremities on command and without difficulty.    Telemetry: Reviewed, V-paced 70s  Labs: Basic Metabolic Panel:  Recent Labs Lab 09/19/16 0353 09/20/16 0428 09/21/16 0437 09/22/16 0602 09/23/16 0534  NA 130* 130* 130* 130* 133*  K 3.9 3.9 3.7 3.7 3.8  CL 92* 92* 90* 91* 91*  CO2 28 31 34* 36* 34*  GLUCOSE 192* 172*  238* 178* 132*  BUN 16 12 14 15 16   CREATININE 0.85 0.82 0.84 0.89 0.85  CALCIUM 8.3* 8.4* 8.3* 8.2* 8.3*  MG  --   --   --  1.9  --     Liver Function Tests:  Recent Labs Lab 09/17/16 0500  AST 21  ALT 35  ALKPHOS 51  BILITOT 0.8  PROT 5.8*  ALBUMIN 2.5*   No results for input(s): LIPASE, AMYLASE in the last 168 hours. No results for input(s): AMMONIA in the last 168 hours.  CBC:  Recent Labs Lab 09/19/16 0353 09/20/16 0428 09/21/16 0437 09/22/16 0602 09/23/16 0534  WBC 12.4* 11.9* 12.3* 12.1* 10.7*  HGB 8.8* 9.1* 9.1* 8.9* 8.8*  HCT 27.0* 28.3* 28.1* 27.2* 27.6*  MCV 92.5 92.2 93.7 91.3 92.6  PLT 244 293 261 282 266    INR:  Recent Labs Lab 09/20/16 0428 09/20/16 1516 09/21/16 0437 09/22/16 0602 09/23/16 0534  INR 1.88 1.80 2.02 2.44 2.41    Other results:    Imaging: Dg Chest 2 View  Result Date: 09/22/2016 CLINICAL DATA:  Dyspnea, CHF, left ventricular assist device. EXAM: CHEST  2 VIEW COMPARISON:  PA and lateral chest x-ray of September 21, 2016 FINDINGS: The right lung is adequately inflated. There is no focal infiltrate. There is a trace of pleural fluid at the right lung base which is stable. On the left there is persistent increased density in the mid and lower lung with obscuration of the hemidiaphragm. There is pleural fluid along the left lateral thoracic wall. The cardiac silhouette remains enlarged. The ICD and left ventricular assist device are in stable position. The PICC line tip is obscured by the adjacent ICD electrode. The sternal wires are intact. IMPRESSION: No significant change in the appearance of the chest since yesterday's study. Left lower lobe atelectasis or pneumonia with small to moderate size left pleural effusion. Stable cardiomegaly without significant pulmonary vascular congestion. Electronically Signed   By: David  Martinique M.D.   On: 09/22/2016 09:55   Ct Chest Wo Contrast  Result Date: 09/22/2016 CLINICAL DATA:  LEFT  ventricular assist device, LEFT pleural effusion, shortness of breath, history hypertension, diabetes mellitus, smoking, chronic systolic CHF, coronary artery disease EXAM: CT CHEST WITHOUT CONTRAST TECHNIQUE: Multidetector CT imaging of the chest was performed following the standard protocol without IV contrast. Sagittal and coronal MPR images reconstructed from axial data set. COMPARISON:  09/04/2016 FINDINGS: Cardiovascular: Scattered atherosclerotic calcifications aorta, coronary arteries and proximal great vessels. Small pericardial effusion. Beam hardening artifacts from LVAD. Pacemaker leads RIGHT atrium and RIGHT ventricle as well as coronary sinus. Ascending thoracic aorta appears dilated 4.2 cm transverse image 54. Mediastinum/Nodes: Enlarged AP window lymph node 17 mm short axis image 47 previously 10 mm. Additional normal sized mediastinal nodes. Air and fluid in the anterior mediastinum consistent with interval median sternotomy. Base of cervical region unremarkable. Minimal gynecomastia. Lungs/Pleura: Moderate LEFT pleural effusion with compressive atelectasis of LEFT lower lobe and partial atelectasis of the LEFT upper lobe. No small RIGHT pleural effusion with minimal compressive atelectasis of the RIGHT lower lobe. Remaining RIGHT lung clear. Upper Abdomen: Post cholecystectomy. 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 Beam hardening artifacts from patient's arms traverse the upper abdomen. Musculoskeletal: Interval median sternotomy. IMPRESSION: Post LVAD with expected postoperative changes of the anterior mediastinum and small pericardial effusion. BILATERAL pleural effusions LEFT greater than RIGHT with significant compressive atelectasis of the LEFT lung. Aortic atherosclerosis and coronary artery disease. Single enlarged AP window lymph node increased since previous exam question related to interval surgery. Electronically  Signed   By: Lavonia Dana M.D.   On: 09/22/2016 15:06     Medications:     Scheduled Medications: . sodium chloride   Intravenous Once  . sodium chloride   Intravenous Once  . amiodarone  200 mg Oral BID  . aspirin EC  81 mg Oral Daily  . atorvastatin  20 mg Oral QHS  . bisacodyl  10 mg Oral Daily  . dextromethorphan-guaiFENesin  1 tablet Oral BID  . digoxin  0.125 mg Oral Daily  . docusate sodium  200 mg Oral Daily  . escitalopram  20 mg Oral Daily  . feeding supplement (GLUCERNA SHAKE)  237 mL Oral BID BM  . furosemide  80 mg Intravenous BID  . insulin aspart  0-15 Units Subcutaneous TID WC  . insulin aspart  0-5 Units Subcutaneous QHS  . insulin detemir  10 Units Subcutaneous BID  . magnesium oxide  400 mg Oral Daily  . mouth rinse  15 mL Mouth Rinse BID  . multivitamin with  minerals  1 tablet Oral Daily  . pantoprazole  40 mg Oral Daily  . potassium chloride  40 mEq Oral Daily  . pregabalin  75 mg Oral Daily  . sorbitol  45 mL Oral q morning - 10a  . spironolactone  25 mg Oral Daily  . warfarin   Does not apply Once  . Warfarin - Pharmacist Dosing Inpatient   Does not apply q1800    Infusions: . sodium chloride Stopped (09/11/16 1000)  . sodium chloride    . sodium chloride 10 mL/hr at 09/17/16 2052  . lactated ringers Stopped (09/12/16 1300)  . lactated ringers 10 mL/hr (09/10/16 2030)  . milrinone 0.25 mcg/kg/min (09/23/16 0821)    PRN Medications: sodium chloride, albumin human, albuterol, fentaNYL (SUBLIMAZE) injection, Melatonin, ondansetron (ZOFRAN) IV, oxyCODONE, senna, sodium chloride flush, sodium chloride flush, sorbitol   Assessment/Plan/Discussion    1. 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  Echo 08/24/16 LVEF 15%, RV mild dilated, moderately reduced.  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 Continue lasix to 80 mg twice a day. Add metolazone today -  Milrinone cut back to  0.25 yesterday. Co-ox improved. Will decrease to 0.125 today  - Dig level elevated. Cut back to 0.125 mg daily.  - VAD parameters stable.  - Continue to mobilize as tolerated.  2. AKI on CKD III: - Stable.   3. Hypercarbic respiratory failure - Improved 4. CAD: No CP. 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  5. DMII: Hgb A1C 8.3 on 08/26/2016  - Cover with SSI  6. RLE cellulitis: Completed 8 days of Vancomycin.  7. Tobacco abuse 8. Hypokalemia:  - Continue to supp as needed.   - Continue spiro.  9. Atrial fibrillation - back in NSR on amio. Amio now  200 mg twice a day.  10. Pocket pain - Improved. continue Lyrica. 11. Left pleural effusion - moderate on CXR. Moderate to arge on CT. Coumadin on hold. Will ask IR for thoracentesis when INR < 2.0  Will continue to wean milrinone. Continue diuresis. Hold coumadin. For IR thoracentesis of left pleural effusion when INR < 2.0   I reviewed the LVAD parameters from today, and compared the results to the patient's prior recorded data.  No programming changes were made.  The LVAD is functioning within specified parameters.  The patient performs LVAD self-test daily.  LVAD interrogation was negative for any significant power changes, alarms or PI events/speed drops.  LVAD equipment check completed and is in good working order.  Back-up equipment present.   LVAD education done on emergency procedures and precautions and reviewed exit site care.  Length of Stay: [place]etta MD 09/23/2016, 8:54 AM  VAD Team --- VAD ISSUES ONLY--- Pager [phone] (7am - 7am)  Advanced Heart Failure Team  Pager [phone] (M-F; 7a - 4p)  Please contact [facility] for night-coverage after hours (4p -7a ) and weekends on [email or web address]

## 2016-09-23 NOTE — Progress Notes (Signed)
Pt. Told me that his a.m. RN changed his dressing this morning. I called RN who denied doing dressing change. I ordered kit and it has not came up.

## 2016-09-23 NOTE — Progress Notes (Signed)
Physical Therapy Treatment Patient Details Name: Ronald Miller. MRN: [id number] DOB: 04/23/1956 Today's Date: 09/23/2016    History of Present Illness Patient is a 60 yo male admitted 08/22/16 with acute on chronic systolic HF, with LVEF at 15%. LVAD implantation 09/09/16  PMH:  chronic HF, ICM, CAD, CABG, ICD, HTN, polysubstance abuse, depression, COPD, CKD, DM    PT Comments    Pt slightly more agreeable and pleasant today but remains agitated easily and does not adhere to sternal precautions or make any effort to recall or implement them. Pt continues to require min cues for power change as he gets confused what he is unhooking and requires grossly 30 sec to attach power each time. Pt required max assist to don/doff holster and place batteries in holster. Will continue to follow.   HR 74-79 sats 93% on RA Speed 5950 Flow 5.5-5.7 PI 2.3-2.4 Power 4.6-4.8  Follow Up Recommendations  Home health PT;Supervision for mobility/OOB     Equipment Recommendations       Recommendations for Other Services       Precautions / Restrictions Precautions Precautions: Sternal Precaution Comments: LVAD    Mobility  Bed Mobility Overal bed mobility: Needs Assistance Bed Mobility: Sit to Sidelying;Sidelying to Sit Rolling: Supervision Sidelying to sit: Min assist     Sit to sidelying: Min assist General bed mobility comments: min assist to raise legs to surface to enter bed and min assist to elevate trunk into sitting for use of urinal  Transfers Overall transfer level: Needs assistance   Transfers: Sit to/from Stand Sit to Stand: Min guard         General transfer comment: mod cues for hand placement and precautions as pt continues to pull on chair despite education and cues  Ambulation/Gait Ambulation/Gait assistance: Min guard Ambulation Distance (Feet): 150 Feet Assistive device: Rolling walker (2 wheeled) Gait Pattern/deviations: Step-through pattern;Decreased  stride length;Trunk flexed   Gait velocity interpretation: Below normal speed for age/gender General Gait Details: cues for posture, looking up, pt denied increased distance   Stairs            Wheelchair Mobility    Modified Rankin (Stroke Patients Only)       Balance Overall balance assessment: Needs assistance   Sitting balance-Leahy Scale: Good       Standing balance-Leahy Scale: Fair                      Cognition Arousal/Alertness: Awake/alert Behavior During Therapy: WFL for tasks assessed/performed Overall Cognitive Status: Impaired/Different from baseline Area of Impairment: Attention;Safety/judgement;Problem solving;Memory     Memory: Decreased recall of precautions   Safety/Judgement: Decreased awareness of safety     General Comments: pt continues to be irritated by sternal precautions and ignores education for them    Exercises      General Comments        Pertinent Vitals/Pain Pain Assessment: 0-10 Pain Score: 8  Pain Location: chest and abdomen  Pain Descriptors / Indicators: Grimacing;Sharp Pain Intervention(s): Monitored during session    Home Living                      Prior Function            PT Goals (current goals can now be found in the care plan section) Progress towards PT goals: Progressing toward goals    Frequency           PT Plan Current plan  remains appropriate    Co-evaluation             End of Session   Activity Tolerance: Patient tolerated treatment well Patient left: in bed;with call bell/phone within reach;with nursing/sitter in room     Time: 1000-1027 PT Time Calculation (min) (ACUTE ONLY): 27 min  Charges:  $Gait Training: 8-22 mins $Therapeutic Activity: 8-22 mins                    G Codes:      Laiklynn Raczynski B Abrial Arrighi 2016/10/10, 10:36 AM  Elwyn Reach, [place]

## 2016-09-23 NOTE — Care Management Important Message (Signed)
Important Message  Patient Details  Name: Ronald Miller. MRN: [id number] Date of Birth: [birth year]   Medicare Important Message Given:  Yes    Nathen May 09/23/2016, 10:48 AM

## 2016-09-24 ENCOUNTER — Other Ambulatory Visit (HOSPITAL_COMMUNITY): Payer: Self-pay | Admitting: Internal Medicine

## 2016-09-24 DIAGNOSIS — Z95811 Presence of heart assist device: Secondary | ICD-10-CM

## 2016-09-24 LAB — LACTATE DEHYDROGENASE: LDH: 200 U/L — ABNORMAL HIGH (ref 98–192)

## 2016-09-24 LAB — GLUCOSE, CAPILLARY
GLUCOSE-CAPILLARY: 206 mg/dL — AB (ref 65–99)
GLUCOSE-CAPILLARY: 156 mg/dL — AB (ref 65–99)
GLUCOSE-CAPILLARY: 156 mg/dL — AB (ref 65–99)
Glucose-Capillary: 175 mg/dL — ABNORMAL HIGH (ref 65–99)

## 2016-09-24 LAB — COOXEMETRY PANEL
Carboxyhemoglobin: 2.2 % — ABNORMAL HIGH (ref 0.5–1.5)
Methemoglobin: 0.7 % (ref 0.0–1.5)
O2 Saturation: 53.9 %
Total hemoglobin: 9.4 g/dL — ABNORMAL LOW (ref 12.0–16.0)

## 2016-09-24 LAB — CBC
HEMATOCRIT: 29 % — AB (ref 39.0–52.0)
HEMOGLOBIN: 9.5 g/dL — AB (ref 13.0–17.0)
MCH: 29.6 pg (ref 26.0–34.0)
MCHC: 32.8 g/dL (ref 30.0–36.0)
MCV: 90.3 fL (ref 78.0–100.0)
Platelets: 288 10*3/uL (ref 150–400)
RBC: 3.21 MIL/uL — ABNORMAL LOW (ref 4.22–5.81)
RDW: 14.8 % (ref 11.5–15.5)
WBC: 12 10*3/uL — ABNORMAL HIGH (ref 4.0–10.5)

## 2016-09-24 LAB — BASIC METABOLIC PANEL
ANION GAP: 8 (ref 5–15)
BUN: 16 mg/dL (ref 6–20)
CO2: 35 mmol/L — AB (ref 22–32)
Calcium: 8.6 mg/dL — ABNORMAL LOW (ref 8.9–10.3)
Chloride: 88 mmol/L — ABNORMAL LOW (ref 101–111)
Creatinine, Ser: 0.97 mg/dL (ref 0.61–1.24)
GFR calc Af Amer: >60 mL/min (ref >60)
GLUCOSE: 232 mg/dL — AB (ref 65–99)
POTASSIUM: 4 mmol/L (ref 3.5–5.1)
Sodium: 131 mmol/L — ABNORMAL LOW (ref 135–145)

## 2016-09-24 LAB — PROTIME-INR
INR: 2.07
Prothrombin Time: 23.6 seconds — ABNORMAL HIGH (ref 11.4–15.2)

## 2016-09-24 MED ORDER — WARFARIN SODIUM 10 MG PO TABS
10.0000 mg | ORAL_TABLET | Freq: Once | ORAL | Status: AC
  Administered 2016-09-24: 10 mg via ORAL
  Filled 2016-09-24: qty 1

## 2016-09-24 NOTE — Progress Notes (Signed)
Patient ID: Ronald Miller., male   DOB: [birth year], 60 y.o.   MRN: [id number]  HeartMate 2 Rounding Note  Subjective:    S/P HMII 09/09/2016   Milrinone turned down to 0.125 yesterday.  Co-ox now 54%. MAPs 70-90s.  INR 2.07   Yesterday diuresed with IV lasix + metolazone.  Weight actually up 5 pounds.   S/P L thoracentesis  With 640 cc removed.      LVAD INTERROGATION:  HeartMate III LVAD:  Flow 5.6 liters/min, speed 5950, power 5.0, PI 2.1 Rare PI  Objective:    Vital Signs:   Temp:  [97.4 F (36.3 C)-98 F (36.7 C)] 98 F (36.7 C) (12/22 0400) Pulse Rate:  [70-75] 70 (12/21 2000) Resp:  [18] 18 (12/22 0400) BP: (85-106)/(48-81) 87/62 (12/22 0000) SpO2:  [92 %-94 %] 94 % (12/22 0400) Weight:  [223 lb 6.4 oz (101.3 kg)] 223 lb 6.4 oz (101.3 kg) (12/22 0500) Last BM Date: 09/21/16 Mean arterial Pressure 70-90s  Intake/Output:   Intake/Output Summary (Last 24 hours) at 09/24/16 0802 Last data filed at 09/24/16 0402  Gross per 24 hour  Intake               10 ml  Output             4000 ml  Net            -3990 ml     Physical Exam: General:  Sitting on the side of the bed. NAD.  HEENT: normal Neck: supple. JVP ~8-9 .  Carotids 2+ bilat; no bruits. No thyromegaly or nodule noted. RIJ swan  Cor: Mechanical heart sounds with LVAD hum present. + CTs Lungs: R dull at base L dull 1/2 up   Abdomen: soft, NT, +distended, no HSM. No bruits or masses. +BS  Driveline: C/D/I; securement device intact and driveline incorporated Extremities: no cyanosis, clubbing, rash, R and LLE 1+ edema Neuro: Awake and oriented. Moves all 4 extremities on command and without difficulty.   Telemetry: Reviewed, V-paced 70s  Labs: Basic Metabolic Panel:  Recent Labs Lab 09/20/16 0428 09/21/16 0437 09/22/16 0602 09/23/16 0534 09/24/16 0353  NA 130* 130* 130* 133* 131*  K 3.9 3.7 3.7 3.8 4.0  CL 92* 90* 91* 91* 88*  CO2 31 34* 36* 34* 35*  GLUCOSE 172* 238* 178* 132* 232*  BUN 12  14 15 16 16   CREATININE 0.82 0.84 0.89 0.85 0.97  CALCIUM 8.4* 8.3* 8.2* 8.3* 8.6*  MG  --   --  1.9  --   --     Liver Function Tests: No results for input(s): AST, ALT, ALKPHOS, BILITOT, PROT, ALBUMIN in the last 168 hours. No results for input(s): LIPASE, AMYLASE in the last 168 hours. No results for input(s): AMMONIA in the last 168 hours.  CBC:  Recent Labs Lab 09/20/16 0428 09/21/16 0437 09/22/16 0602 09/23/16 0534 09/24/16 0353  WBC 11.9* 12.3* 12.1* 10.7* 12.0*  HGB 9.1* 9.1* 8.9* 8.8* 9.5*  HCT 28.3* 28.1* 27.2* 27.6* 29.0*  MCV 92.2 93.7 91.3 92.6 90.3  PLT 293 261 282 266 288    INR:  Recent Labs Lab 09/20/16 1516 09/21/16 0437 09/22/16 0602 09/23/16 0534 09/24/16 0353  INR 1.80 2.02 2.44 2.41 2.07    Other results:    Imaging: Dg Chest 1 View  Result Date: 09/23/2016 CLINICAL DATA:  Status post thoracentesis EXAM: CHEST 1 VIEW COMPARISON:  CT scan of the chest and PA and lateral chest x-ray dated  dated September 22, 2016. FINDINGS: The volume of pleural fluid on the left has decreased slightly. There is no postprocedure pneumothorax. There are is persistent increased density that suggests atelectasis or infiltrate at the left lung base. The right lung is well-expanded and clear. The cardiac silhouette remains enlarged. The left ventricular assist device is in stable position. The ICD also appears stable. The PICC line tip is difficult to discern due to the overlying ICD electrode. IMPRESSION: Slight interval decrease in the volume of pleural fluid on the left. No postprocedure complication. Stable cardiomegaly and support devices. Electronically Signed   By: David  Martinique M.D.   On: 09/23/2016 12:05   Dg Chest 2 View  Result Date: 09/22/2016 CLINICAL DATA:  Dyspnea, CHF, left ventricular assist device. EXAM: CHEST  2 VIEW COMPARISON:  PA and lateral chest x-ray of September 21, 2016 FINDINGS: The right lung is adequately inflated. There is no focal  infiltrate. There is a trace of pleural fluid at the right lung base which is stable. On the left there is persistent increased density in the mid and lower lung with obscuration of the hemidiaphragm. There is pleural fluid along the left lateral thoracic wall. The cardiac silhouette remains enlarged. The ICD and left ventricular assist device are in stable position. The PICC line tip is obscured by the adjacent ICD electrode. The sternal wires are intact. IMPRESSION: No significant change in the appearance of the chest since yesterday's study. Left lower lobe atelectasis or pneumonia with small to moderate size left pleural effusion. Stable cardiomegaly without significant pulmonary vascular congestion. Electronically Signed   By: David  Martinique M.D.   On: 09/22/2016 09:55   Ct Chest Wo Contrast  Result Date: 09/22/2016 CLINICAL DATA:  LEFT ventricular assist device, LEFT pleural effusion, shortness of breath, history hypertension, diabetes mellitus, smoking, chronic systolic CHF, coronary artery disease EXAM: CT CHEST WITHOUT CONTRAST TECHNIQUE: Multidetector CT imaging of the chest was performed following the standard protocol without IV contrast. Sagittal and coronal MPR images reconstructed from axial data set. COMPARISON:  09/04/2016 FINDINGS: Cardiovascular: Scattered atherosclerotic calcifications aorta, coronary arteries and proximal great vessels. Small pericardial effusion. Beam hardening artifacts from LVAD. Pacemaker leads RIGHT atrium and RIGHT ventricle as well as coronary sinus. Ascending thoracic aorta appears dilated 4.2 cm transverse image 54. Mediastinum/Nodes: Enlarged AP window lymph node 17 mm short axis image 47 previously 10 mm. Additional normal sized mediastinal nodes. Air and fluid in the anterior mediastinum consistent with interval median sternotomy. Base of cervical region unremarkable. Minimal gynecomastia. Lungs/Pleura: Moderate LEFT pleural effusion with compressive atelectasis  of LEFT lower lobe and partial atelectasis of the LEFT upper lobe. No small RIGHT pleural effusion with minimal compressive atelectasis of the RIGHT lower lobe. Remaining RIGHT lung clear. Upper Abdomen: Post cholecystectomy. 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 Beam hardening artifacts from patient's arms traverse the upper abdomen. Musculoskeletal: Interval median sternotomy. IMPRESSION: Post LVAD with expected postoperative changes of the anterior mediastinum and small pericardial effusion. BILATERAL pleural effusions LEFT greater than RIGHT with significant compressive atelectasis of the LEFT lung. Aortic atherosclerosis and coronary artery disease. Single enlarged AP window lymph node increased since previous exam question related to interval surgery. Electronically Signed   By: Lavonia Dana M.D.   On: 09/22/2016 15:06   US Thoracentesis Asp Pleural Space W/img Guide  Result Date: 09/23/2016 INDICATION: Patient with history of CHF s/p LVAD and redo sternotomy presents with left pleural effusion.  Request made for diagnostic and therapeutic thoracentesis. EXAM: ULTRASOUND GUIDED DIAGNOSTIC AND THERAPEUTIC LEFT THORACENTESIS MEDICATIONS: 10 mL 1% lidocaine. COMPLICATIONS: None immediate. PROCEDURE: An ultrasound guided thoracentesis was thoroughly discussed with the patient and questions answered. The benefits, risks, alternatives and complications were also discussed. The patient understands and wishes to proceed with the procedure. Written consent was obtained. Ultrasound was performed to localize and mark an adequate pocket of fluid in the left chest. The area was then prepped and draped in the normal sterile fashion. 1% Lidocaine was used for local anesthesia. Under ultrasound guidance a Safe-T-centesis catheter was introduced. Thoracentesis was performed. The catheter was removed and a dressing applied. FINDINGS: A total of  approximately 640 mL of amber fluid was removed. Samples were sent to the laboratory as requested by the clinical team. IMPRESSION: Successful ultrasound guided diagnostic and therapeutic left thoracentesis yielding 640 mL of pleural fluid. Procedure stopped to due hypotension. Read by:  Brynda Greathouse PA-C Electronically Signed   By: Lucrezia Europe M.D.   On: 09/23/2016 12:01     Medications:     Scheduled Medications: . amiodarone  200 mg Oral BID  . aspirin EC  81 mg Oral Daily  . atorvastatin  20 mg Oral QHS  . bisacodyl  10 mg Oral Daily  . dextromethorphan-guaiFENesin  1 tablet Oral BID  . digoxin  0.125 mg Oral Daily  . docusate sodium  200 mg Oral Daily  . escitalopram  20 mg Oral Daily  . feeding supplement (GLUCERNA SHAKE)  237 mL Oral BID BM  . furosemide  80 mg Intravenous BID  . insulin aspart  0-15 Units Subcutaneous TID WC  . insulin aspart  0-5 Units Subcutaneous QHS  . insulin detemir  10 Units Subcutaneous BID  . magnesium oxide  400 mg Oral Daily  . mouth rinse  15 mL Mouth Rinse BID  . metolazone  5 mg Oral Daily  . multivitamin with minerals  1 tablet Oral Daily  . pantoprazole  40 mg Oral Daily  . potassium chloride  40 mEq Oral Daily  . pregabalin  75 mg Oral Daily  . sorbitol  45 mL Oral q morning - 10a  . spironolactone  25 mg Oral Daily  . warfarin  10 mg Oral ONCE-1800  . warfarin   Does not apply Once  . Warfarin - Pharmacist Dosing Inpatient   Does not apply q1800    Infusions: . sodium chloride Stopped (09/11/16 1000)  . sodium chloride    . sodium chloride 10 mL/hr at 09/17/16 2052  . lactated ringers Stopped (09/12/16 1300)  . lactated ringers 10 mL/hr (09/10/16 2030)  . milrinone 0.125 mcg/kg/min (09/23/16 2044)    PRN Medications: sodium chloride, albuterol, fentaNYL (SUBLIMAZE) injection, Melatonin, ondansetron (ZOFRAN) IV, oxyCODONE, senna, sodium chloride flush, sodium chloride flush, sorbitol   Assessment/Plan/Discussion    1. 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  Echo 08/24/16 LVEF 15%, RV mild dilated, moderately reduced.  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 Continue lasix to 80 mg twice a day and continue metolazone today -  Today's CO-OX 54%. Stop Milrinone.  - Dig level elevated. Continue dig  0.125 mg daily.  - VAD parameters stable.  - Continue to mobilize as tolerated.  2. AKI on CKD III: - Stable.   3. Hypercarbic respiratory failure - Improved 4. CAD: No CP. 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  5. DMII: Hgb A1C 8.3 on 08/26/2016  - Cover with SSI  6. RLE cellulitis: Completed 8 days of Vancomycin.  7. Tobacco abuse 8. Hypokalemia:  - Continue to supp as needed.   - Continue spiro.  9. Atrial fibrillation - back in NSR on amio. Amio now  200 mg twice a day.  10. Pocket pain - Improved. continue Lyrica. 11. Left pleural effusion - S/P L thoracentesis 640 cc removed.   [place] set up for D/C   I reviewed the LVAD parameters from today, and compared the results to the patient's prior recorded data.  No programming changes were made.  The LVAD is functioning within specified parameters.  The patient performs LVAD self-test daily.  LVAD interrogation was negative for any significant power changes, alarms or PI events/speed drops.  LVAD equipment check completed and is in good working order.  Back-up equipment present.   LVAD education done on emergency procedures and precautions and reviewed exit site care.  Length of Stay: [place] NP-C  09/24/2016, 8:02 AM  VAD Team --- VAD ISSUES ONLY--- Pager [phone] (7am - 7am)  Advanced Heart Failure Team  Pager [phone] (M-F; 7a - 4p)  Please contact [facility] for night-coverage after hours (4p -7a ) and weekends on [email or web address]   Patient seen and examined with Darrick Grinder, NP. We discussed all aspects of the encounter. I agree with the  assessment and plan as stated above.   He is improved. Thoracentesis yesterday with 640 out but still with a significant effusion left. Co-ox 53% on milrinone 0.125. Will stop. Continue IV diuresis today. Will plan for discharge later today or tomorrow depending on family's readiness with dressing changes and other issues. INR 2.07  Penne Rosenstock,MD 8:34 AM

## 2016-09-24 NOTE — Progress Notes (Signed)
LVAD Coordinator Encounter:  Implanted 09/09/16 with HeartMate 3 LVAD as Destination Therapy by Dr. Prescott Gum.   LVAD interrogation reveals:  Speed: 5900 Flow: 5.8 Power: 5.1w PI: 2.0 Alarms: no clinical alarms Events: few PIs Fixed speed: 5900 Low speed limit: 5600  Emergency back up equipment including system controller present.  Drive Line: C/D/I. Changed today by his mother, Holley Raring with my supervision. She is having a very hard time understanding and adhering to sterile technique. Gets frustrated and often stops performing dressing. Had to have her change gloves 4 times during the dressing change at various intervals. Strong guidance needed by myself and her family (frequently touches gloves tips, leans on dressing field, adjusts clothes, touches bed, difficulty putting on)  Multiple times needed to correct technique for cleaning site (ie: no 'double dipping' at the site with CHG preps, no pulling the driveline site, no cleaning of the CT sites during this procedure).   Have coordinated today with Carolynn Sayers [facility]) to have [facility] team supplement and provide guidance and reassurance for daily dressing change requirement. Sent instructions for dressing change procedure, drive line care and precautions, what to call for regarding drainage, as well as the above witnessed problems with technique to allow for continuity of correction.  Provided them with my 24hr pager should they have questions.   LDH trend: Peak 485 >> baseline of 200 - 250 since implant. Denies tea/cola-colored urine.   MAPs 79 - 80s  Post Op VAD Education Plan: Dressing change re-education and plan as above  Mr. Thommen is much more awake today and able to handle VAD equipment independently. His home equipment has been delivered to his room. All VAD related equipment should be discharged home with him whenever that may occur. He verbalized he is "a little nervous going home. Like having a new baby"  regarding his equipment. Provided emotional support. Practiced paging VAD Pager as well should he have any questions or need assistance after he goes home (likely tomorrow per medical team).   More education today provided about Warfarin diet/routine. Emphasis on consistent diet intake.   Follow up Appointment made for Wednesday 09/29/16 @ 14:00  [name], RN VAD Coordinator   Office: [phone] 24/7 Emergency VAD Pager: [phone]

## 2016-09-24 NOTE — Progress Notes (Signed)
Ronald Miller. has been discussed with the VAD Medical Review board on 08/30/2016. The team feels as if the patient is an acceptable candidate for Destination LVAD therapy. The patient meets criteria for a LVAD implant as listed below:  1)  Has failed to respond to optimal medical management (including beta-blockers and ACE inhibitors if tolerated) for at least 45 of the last 60 days, or have been balloon dependent for 7 days, or IV inotrope dependent for 14 days; and,       *On Inotropes Milrinone started June 2017.  2)  Has a left ventricular ejection fraction (LVEF) < 25% and, have demonstrated functional limitation with a peak oxygen consumption of <14 ml/kg/min unless balloon pump or inotrope dependent or physically unable to perform the test.         *EF 20%  By echo (date)  05/12/2016            *CPX (date)  pVO2 ____ RER_____ VE/VCo2 slope_____ *unable to complete d/t milrinone  3)  Social work and palliative care evaluations demonstrate appropriate support system in place for discharge to home with a VAD and that end of life discussions have taken place. Both services have expressed no concern regarding patient's candidacy.         *Social work consult (date): 08/30/2016 - Raquel Sarna, [place] (date): 09/01/2016 - Faylene Million, NP  4)  Primary caretaker identified that can be taught along with the patient how to manage        the VAD equipment.        *Name: Holley Raring (mother) & Karna Christmas (sister). He will live with Terri for immediate post-op recovery period.  5)  Deemed appropriate by our financial coordinator: Cecilio Asper - 09/04/2016        Prior approval: 09/07/2016 - HM3 off-label use as he is likely going to be BTT in future.  6)  VAD Coordinator, [name] has met with patient and caregiver, shown them the VAD equipment and discussed with the patient and caregiver about lifestyle changes necessary for success on mechanical circulatory  device.        *Met with Ronald Miller. on 08/30/2016.        *Consent for VAD Evaluation/Caregiver Agreement/HIPPA Release/Photo Release signed on 08/30/2016  7)  Patient has been discussed with Dr. Hoyt Koch at [facility] where he was recently being evaluated for heart transplant. This was put on 6 month hold d/t active smoking and + cocaine. Interval LVAD is appropriate at this time due to deteriorating status and hopes to get patient to transplant.     8)  Intermacs profile: 2  INTERMACS 1: Critical cardiogenic shock describes a patient who is "crashing and burning", in which a patient has life-threatening hypotension and rapidly escalating inotropic pressor support, with critical organ hypoperfusion often confirmed by worsening acidosis and lactate levels.  INTERMACS 2: Progressive decline describes a patient who has been demonstrated "dependent" on inotropic support but nonetheless shows signs of continuing deterioration in nutrition, renal function, fluid retention, or other major status indicator. Patient profile 2 can also describe a patient with refractory volume overload, perhaps with evidence of impaired perfusion, in whom inotropic infusions cannot be maintained due to tachyarrhythmias, clinical ischemia, or other intolerance.  INTERMACS 3: Stable but inotrope dependent describes a patient who is clinically stable on mild-moderate doses of intravenous inotropes (or has a temporary circulatory support device) after repeated  documentation of failure to wean without symptomatic hypotension, worsening symptoms, or progressive organ dysfunction (usually renal). It is critical to monitor nutrition, renal function, fluid balance, and overall status carefully in order to distinguish between a  patient who is truly stable at Patient Profile 3 and a patient who has unappreciated decline rendering them Patient Profile 2. This patient may be either at home or in the hospital.    INTERMACS 4: Resting symptoms describes a patient who is at home on oral therapy but frequently has symptoms of congestion at rest or with activities of daily living (ADL). He or she may have orthopnea, shortness of breath during ADL such as dressing or bathing, gastrointestinal symptoms (abdominal discomfort, nausea, poor appetite), disabling ascites or severe lower extremity edema. This patient should be carefully considered for more intensive management and surveillance programs, which may in some cases, reveal poor compliance that would compromise outcomes with any therapy.  .  INTERMACS 5: Exertion Intolerant describes a patient who is comfortable at rest but unable to engage in any activity, living predominantly within the house or housebound. This patient has no congestive symptoms, but may have chronically elevated volume status, frequently with renal dysfunction, and may be characterized as exercise intolerant.   INTERMACS 6: Exertion Limited also describes a patient who is comfortable at rest without evidence of fluid overload, but who is able to do some mild activity. Activities of daily living are comfortable and minor activities outside the home such as visiting friends or going to a restaurant can be performed, but fatigue results within a few minutes of any meaningful physical exertion. This patient has occasional episodes of worsening symptoms and is likely to have had a hospitalization for heart failure within the past year.   INTERMACS 7: Advanced NYHA Class 3 describes a patient who is clinically stable with a reasonable level of comfortable activity, despite history of previous decompensation that is not recent. This patient is usually able to walk more than a block. Any decompensation requiring intravenous diuretics or hospitalization within the previous month should make this person a Patient Profile 6 or lower.    9)  NYHA Class: IV  09/09/2016 implant surgery  scheduled with Dr. Prescott Gum - will proceed with HM3 implant under Destination Therapy criteria, with hopes to bridge to candidacy.   Agree with above. I have personally discussed with Dr. Hoyt [facility] at [facility]. Patient will likely be candidate for heart transplantation in near future (once he completes counseling for tobacco and substance abuse - which he has been motivated to do). Given previous GI bleeding and previous strenotomy we have requested HM III device to facilitate bridge to transplantation and have applied for an exception for coverage with this understanding.   Shelbey Spindler,MD 10:40 PM

## 2016-09-24 NOTE — Progress Notes (Addendum)
CARDIAC REHAB PHASE I   PRE:  Rate/Rhythm: 81 pacing    BP: sitting 84 MAP    SaO2: 94 RA  MODE:  Ambulation: 500 ft   POST:  Rate/Rhythm: 83 pacing    BP: sitting 86 MAP     SaO2: 98 RA  Pt much more agreeable today. He was able to don batteries into vest today with prompting. He c/o pectoral pain with stretching. He is slow to process at times, although seems to like to rely on staff for help so maybe he is waiting on [place] to "jump in". Steady walking, not as SOB. Increased distance (longest walk yet). Overall tolerated well. He is also standing and sitting without assist. He needs a rollator for home. Ed completed with pt, voicing understanding. He enjoys CRPII and I will send referral back to [name]. Sts he is done smoking. Declined fake cigarette.  Many, ACSM 09/24/2016 10:42 AM

## 2016-09-24 NOTE — Progress Notes (Signed)
[facility] for Coumadin Indication: LVAD  Allergies  Allergen Reactions  . Codeine Nausea And Vomiting  . Lipitor [Atorvastatin] Nausea Only    Nausea with high doses, tolerates 20mg  dose (08/22/16)    Patient Measurements: Height: 5\' 9"  (175.3 cm) Weight: 223 lb 6.4 oz (101.3 kg) IBW/kg (Calculated) : 70.7 Heparin Dosing Weight: n/a  Vital Signs: Temp: 98 F (36.7 C) (12/22 0400) Temp Source: Oral (12/22 0400) BP: 87/62 (12/22 0000) Pulse Rate: 70 (12/21 2000)  Labs:  Recent Labs  09/22/16 0602 09/23/16 0534 09/24/16 0353  HGB 8.9* 8.8* 9.5*  HCT 27.2* 27.6* 29.0*  PLT 282 266 288  LABPROT 26.9* 26.7* 23.6*  INR 2.44 2.41 2.07  CREATININE 0.89 0.85 0.97    Estimated Creatinine Clearance: 96.1 mL/min (by C-G formula based on SCr of 0.97 mg/dL).  Assessment: 60 yo male s/p HM3 LVAD 09/09/16.  Coumadin started by TCTS on 12/8, bivalirudin added 12/11 (TCTS managing). Pharmacy asked to begin managing Coumadin dosing.   Coumadin dose missed 12/12, held 12/20 for thoracentesis completed 12/21.  INR trending down this am to 2.07, no bleeding issues or complications noted.  Education done 09/23/16.   At this point patient is averaging about 7.5mg  of warfarin daily to maintain INR. I will give him a slightly higher dose tonight with down trend and dose held last night. Would probably recommend d/c home with 5mg  tablets with expectation that he will initially need 7.5mg  daily and eventually a few days of 10mg  if diet significantly changes at home.   Goal of Therapy:  INR 2-2.5 Monitor platelets by anticoagulation protocol: Yes   Plan:  1. Warfarin 10mg  tonight 3. Daily PT/INR.  Erin Hearing PharmD., BCPS Clinical Pharmacist Pager [phone] 09/24/2016 7:15 AM

## 2016-09-24 NOTE — Progress Notes (Signed)
15 Days Post-Op Procedure(s) (LRB): REDO STERNOTOMY (N/A) INSERTION OF IMPLANTABLE LEFT VENTRICULAR ASSIST DEVICE (N/A) TRANSESOPHAGEAL ECHOCARDIOGRAM (TEE) (N/A) Subjective: Patient feels improved today after left thoracentesis Chest x-ray shows improved aeration at left lung base with some baseline elevation of left hemidiaphragm from redo dissection to create a VAD pump pocket Patient is now off milrinone and hopes to be discharged soon Surgical incisions are well-healed. Chest tube sutures removed today Maintaining sinus rhythm on amiodarone  Objective: Vital signs in last 24 hours: Temp:  [97.4 F (36.3 C)-98 F (36.7 C)] 98 F (36.7 C) (12/22 0400) Pulse Rate:  [70-77] 77 (12/22 1124) Cardiac Rhythm: Ventricular paced (12/22 0700) Resp:  [18] 18 (12/22 0400) BP: (87-100)/(62-81) 89/78 (12/22 0849) SpO2:  [92 %-94 %] 94 % (12/22 0400) Weight:  [223 lb 6.4 oz (101.3 kg)] 223 lb 6.4 oz (101.3 kg) (12/22 0500)  Hemodynamic parameters for last 24 hours:  stable  Intake/Output from previous day: 12/21 0701 - 12/22 0700 In: 10 [I.V.:10] Out: 4000 [Urine:4000] Intake/Output this shift: Total I/O In: -  Out: 1025 [Urine:1025]       Exam    General- alert and comfortable   Lungs- clear without rales, wheezes   Cor- regular rate and rhythm, phasic VAD humming   Abdomen- soft, non-tender   Extremities - warm, non-tender, minimal edema   Neuro- oriented, appropriate, no focal weakness   Lab Results:  Recent Labs  09/23/16 0534 09/24/16 0353  WBC 10.7* 12.0*  HGB 8.8* 9.5*  HCT 27.6* 29.0*  PLT 266 288   BMET:  Recent Labs  09/23/16 0534 09/24/16 0353  NA 133* 131*  K 3.8 4.0  CL 91* 88*  CO2 34* 35*  GLUCOSE 132* 232*  BUN 16 16  CREATININE 0.85 0.97  CALCIUM 8.3* 8.6*    PT/INR:  Recent Labs  09/24/16 0353  LABPROT 23.6*  INR 2.07   ABG    Component Value Date/Time   PHART 7.390 09/11/2016 1338   HCO3 24.9 09/11/2016 1338   TCO2 33  09/14/2016 1618   ACIDBASEDEF 3.0 (H) 09/11/2016 0423   O2SAT 53.9 09/24/2016 0418   CBG (last 3)   Recent Labs  09/23/16 2041 09/24/16 0612 09/24/16 1223  GLUCAP 161* 175* 206*    Assessment/Plan: S/P Procedure(s) (LRB): REDO STERNOTOMY (N/A) INSERTION OF IMPLANTABLE LEFT VENTRICULAR ASSIST DEVICE (N/A) TRANSESOPHAGEAL ECHOCARDIOGRAM (TEE) (N/A) Patient feels clinically improved on new VAD speed Chest x-ray improved after left thoracentesis volume status appears to be satisfactory   LOS: 33 days    Ronald Miller 09/24/2016

## 2016-09-24 NOTE — Progress Notes (Signed)
Occupational Therapy Treatment Patient Details Name: Ronald Miller. MRN: [id number] DOB: [birth year] Today's Date: 09/24/2016    History of present illness Patient is a 60 yo male admitted 08/22/16 with acute on chronic systolic HF, with LVEF at 15%. LVAD implantation 09/09/16  PMH:  chronic HF, ICM, CAD, CABG, ICD, HTN, polysubstance abuse, depression, COPD, CKD, DM   OT comments  Pt states he feel comfortable with ADLs and has no concerns for discharge.  He required max cues to don battery vest correctly becoming quite irritable when he erred.  He was unable to problem solve through a solution.  Reviewed energy conservation, precautions and safety during ADLs with pt.  He stood x 1 without securing controller, and becomes irritable when reminded to secure equipment at all time.  Limited active participation as he states he has pain and is fatigued after working with cardiac rehab.  He becomes angry when encouraged to do more.    Follow Up Recommendations  Home health OT;Supervision/Assistance - 24 hour    Equipment Recommendations  Tub/shower seat    Recommendations for Other Services      Precautions / Restrictions Precautions Precautions: Sternal Precaution Comments: LVAD       Mobility Bed Mobility               General bed mobility comments: up in chair   Transfers Overall transfer level: Needs assistance Equipment used: None Transfers: Sit to/from Stand;Stand Pivot Transfers Sit to Stand: Min guard Stand pivot transfers: Min guard            Balance Overall balance assessment: Needs assistance Sitting-balance support: Feet supported Sitting balance-Leahy Scale: Good     Standing balance support: No upper extremity supported;During functional activity Standing balance-Leahy Scale: Fair Standing balance comment: Pt with minimal sway and occasional LOB when standing                    ADL Overall ADL's : Needs assistance/impaired                                        General ADL Comments: Pt unable to correctly don LVAD vest.  He becomes very irritable and angry when asked to perform tasks he does not want to do.  Reviewed proper technique for donning vest, and encouraged him to have family assist as well as to look for a fishing or hunting vest that may be easier to manage.  Reviewed safety with LVAD equipment during ADL tasks.  He stood without securing controller and became angry when corrected.  Reinforced safety with him and risks associated with not properly securing controller.   Reviewed energy conservation techniques with pt.  Recommend shower seat.  Reviewed with him that he is to sponge bathe until cleared by MD to shower - NO tub baths.  Pt would not participate in further activity stating he had just ambulated       Vision                     Perception     Praxis      Cognition   Behavior During Therapy:  (irritable ) Overall Cognitive Status: Impaired/Different from baseline       Memory: Decreased recall of precautions        Problem Solving: Requires verbal cues General Comments: Pt with difficulty donning LVAD vest  requiring max cues to problem solve through errors     Extremity/Trunk Assessment               Exercises     Shoulder Instructions       General Comments      Pertinent Vitals/ Pain       Pain Assessment: 0-10 Pain Score: 7  Faces Pain Scale: Hurts whole lot Pain Location: sternal and abdomen  Pain Descriptors / Indicators: Grimacing;Guarding Pain Intervention(s): Monitored during session;Premedicated before session  Home Living                                          Prior Functioning/Environment              Frequency  Min 3X/week        Progress Toward Goals  OT Goals(current goals can now be found in the care plan section)  Progress towards OT goals: Progressing toward goals     Plan Discharge plan  remains appropriate;Equipment recommendations need to be updated    Co-evaluation                 End of Session     Activity Tolerance Patient limited by pain;Patient limited by fatigue   Patient Left in chair;with call bell/phone within reach   Nurse Communication Mobility status        Time: 1121-1201 OT Time Calculation (min): 40 min  Charges: OT General Charges $OT Visit: 1 Procedure OT Treatments $Self Care/Home Management : 38-52 mins  Ronald Miller 09/24/2016, 4:46 PM

## 2016-09-25 LAB — PROTIME-INR
INR: 2.04
PROTHROMBIN TIME: 23.3 s — AB (ref 11.4–15.2)

## 2016-09-25 LAB — COOXEMETRY PANEL
Carboxyhemoglobin: 2.2 % — ABNORMAL HIGH (ref 0.5–1.5)
Methemoglobin: 0.7 % (ref 0.0–1.5)
O2 Saturation: 52.9 %
Total hemoglobin: 9.5 g/dL — ABNORMAL LOW (ref 12.0–16.0)

## 2016-09-25 LAB — GLUCOSE, CAPILLARY
GLUCOSE-CAPILLARY: 107 mg/dL — AB (ref 65–99)
Glucose-Capillary: 147 mg/dL — ABNORMAL HIGH (ref 65–99)
Glucose-Capillary: 251 mg/dL — ABNORMAL HIGH (ref 65–99)

## 2016-09-25 LAB — LACTATE DEHYDROGENASE: LDH: 186 U/L (ref 98–192)

## 2016-09-25 LAB — CBC
HCT: 29 % — ABNORMAL LOW (ref 39.0–52.0)
Hemoglobin: 9.3 g/dL — ABNORMAL LOW (ref 13.0–17.0)
MCH: 28.8 pg (ref 26.0–34.0)
MCHC: 32.1 g/dL (ref 30.0–36.0)
MCV: 89.8 fL (ref 78.0–100.0)
PLATELETS: 290 10*3/uL (ref 150–400)
RBC: 3.23 MIL/uL — AB (ref 4.22–5.81)
RDW: 15.1 % (ref 11.5–15.5)
WBC: 12.5 10*3/uL — AB (ref 4.0–10.5)

## 2016-09-25 LAB — BASIC METABOLIC PANEL
Anion gap: 10 (ref 5–15)
BUN: 16 mg/dL (ref 6–20)
CALCIUM: 8.6 mg/dL — AB (ref 8.9–10.3)
CO2: 34 mmol/L — AB (ref 22–32)
CREATININE: 0.9 mg/dL (ref 0.61–1.24)
Chloride: 88 mmol/L — ABNORMAL LOW (ref 101–111)
GFR calc non Af Amer: >60 mL/min (ref >60)
Glucose, Bld: 154 mg/dL — ABNORMAL HIGH (ref 65–99)
Potassium: 3.9 mmol/L (ref 3.5–5.1)
SODIUM: 132 mmol/L — AB (ref 135–145)

## 2016-09-25 MED ORDER — PANTOPRAZOLE SODIUM 40 MG PO TBEC: 40.0000 mg | DELAYED_RELEASE_TABLET | Freq: Every day | ORAL | 6 refills | Status: DC

## 2016-09-25 MED ORDER — WARFARIN SODIUM 5 MG PO TABS: 7.5000 mg | ORAL_TABLET | Freq: Every day | ORAL | 11 refills | Status: DC

## 2016-09-25 MED ORDER — INSULIN DETEMIR 100 UNIT/ML FLEXPEN: 10.0000 [IU] | PEN_INJECTOR | Freq: Two times a day (BID) | SUBCUTANEOUS | 11 refills | Status: DC

## 2016-09-25 MED ORDER — INSULIN DETEMIR 100 UNIT/ML ~~LOC~~ SOLN: 10.0000 [IU] | Freq: Two times a day (BID) | SUBCUTANEOUS | 11 refills | Status: DC

## 2016-09-25 MED ORDER — DOCUSATE SODIUM 100 MG PO CAPS: 200.0000 mg | ORAL_CAPSULE | Freq: Every day | ORAL | 6 refills | Status: DC

## 2016-09-25 MED ORDER — MAGNESIUM OXIDE 400 (241.3 MG) MG PO TABS: 400.0000 mg | ORAL_TABLET | Freq: Every day | ORAL | 0 refills | Status: DC

## 2016-09-25 MED ORDER — AMIODARONE HCL 200 MG PO TABS: 200.0000 mg | ORAL_TABLET | Freq: Two times a day (BID) | ORAL | 6 refills | Status: DC

## 2016-09-25 MED ORDER — POTASSIUM CHLORIDE ER 20 MEQ PO TBCR: 20.0000 meq | EXTENDED_RELEASE_TABLET | Freq: Every day | ORAL | 6 refills | Status: DC

## 2016-09-25 MED ORDER — TORSEMIDE 20 MG PO TABS: 40.0000 mg | ORAL_TABLET | Freq: Every day | ORAL | Status: DC

## 2016-09-25 MED ORDER — DM-GUAIFENESIN ER 30-600 MG PO TB12: 1.0000 | ORAL_TABLET | Freq: Two times a day (BID) | ORAL | 0 refills | Status: DC

## 2016-09-25 MED ORDER — WARFARIN SODIUM 10 MG PO TABS
10.0000 mg | ORAL_TABLET | ORAL | Status: AC
  Administered 2016-09-25: 10 mg via ORAL
  Filled 2016-09-25: qty 1

## 2016-09-25 MED ORDER — PREGABALIN 75 MG PO CAPS: 75.0000 mg | ORAL_CAPSULE | Freq: Every day | ORAL | 6 refills | Status: DC

## 2016-09-25 MED ORDER — GLUCERNA SHAKE PO LIQD: 237.0000 mL | Freq: Two times a day (BID) | ORAL | 0 refills | Status: DC

## 2016-09-25 MED ORDER — DIGOXIN 125 MCG PO TABS: 0.1250 mg | ORAL_TABLET | Freq: Every day | ORAL | 6 refills | Status: DC

## 2016-09-25 NOTE — Progress Notes (Signed)
Cardiac Rehab Progress Note: In to ambulate with patient. His lunch tray also arrived at same time. Patient became extremely agitated. "Annoyed that everything always happens at the same time". Patient refused to ambulate and stated he was going home today. Patient stated NO when asked if he had any discharge questions when asked about going home with VAD. Education completed during previous cardiac rehab session.

## 2016-09-25 NOTE — Progress Notes (Signed)
Pt's sister and brother in law arrived to bedside. Given 2 copies of discharge instructions and Lyrica prescription. All questions answered. Gave coumadin prior to discharge - instructed pt to not take again tonight. Pt's & family verbalized understanding of need to do a dressing change tonight. Family and pt verbalized understanding of all instructions.  Pt requested prescription for pain medication prior to DC - Dr. Jeffie Pollock made aware. No new orders received. Educated patient on use of Lyrica and Tylenol as prescribed for pain.   PICC line removed prior to discharge. Site stable; dressing clean, dry, intact. Telemetry removed, CCMD notified.  Pt discharged with all VAD equipment.  Fritz Pickerel, RN

## 2016-09-25 NOTE — Progress Notes (Signed)
PICC removed per order. Vaseline gauze used to occlude site. Pressure held x 3 min.s. Post removal care given and pt. demonstrated by teach back.

## 2016-09-25 NOTE — Discharge Summary (Signed)
Advanced Heart Failure Team  Discharge Summary   Patient ID: Ronald Miller. MRN: [id number], DOB/AGE: March 12, 1956 60 y.o. Admit date: 08/22/2016 D/C date:     09/25/2016   Primary Discharge Diagnoses:  1. Cardiogenic shock 2. 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 AKI, resolved 4. CAD s/p CABG 5. RLE cellulitis, resolved 6. PAF    --back in NSR on amio 7. DM2 8. Left pleural effusion    --s/p IR throacentesis  Hospital Course:   Ronald Milleris a 59 y.o.male with history of chronic systolic HF s/p [employer] ICD 2014, CAD s/p CABG in 2000, HTN, Hx of cocaine abuse, Tobacco abuse, Depression, and COPD. Echo 8/17: LVEF 20% RV moderate to severe HK.  Has been on milrinone at home since 03/2016. Following with Dr. Hoyt Koch at [name] for transplant w/u but listing but on hold due to ongoing tobacco use and a single +UDS. Admitted to [facility] 08/22/16 with cardiogenic shock despite milrinone support. Co-ox 51% on milrinone 0.5 mcg/kg/min. On admission swan placed with marked volume overload and low cardiac output. Inotropes adjusted and diuresed. Alsot reated with vancomycin for RLE cellulitis  Patient presented to [facility]. After multiple discussions, he was approved for HM-III LVAD.   Underwent VAD placement 09/09/16. Post-op course complicated by RV failure, volume overload, left pleural effusion and PAF. He was diuresed successfully. Milrinone weaned. Underwent ramp echo prior to d/c and speed adjusted. Also underwent left thoracentesis with IR (600 cc off) with moderate improvement in size of effusion. Warfarin loaded. INR 2.0. Co-ox on date of d/c was 52% but he was ambulating well without symptoms.   Patient and underwent extensive VAD teaching by VAD coordinator. HHRN arranged to help with dressings. Felt tstable for d/c on 12/23/1   LVAD Interrogation HM II:   Speed: 5900    Flow: 5.8     PI: 2.2      Power:  5.0     Back-up speed:        Discharge Vitals: Blood pressure 97/79, pulse 77, temperature 97.8 F (36.6 C), temperature source Oral, resp. rate 18, height 5\' 9"  (1.753 m), weight 219 lb 1.6 oz (99.4 kg), SpO2 (!) 88 %.  Physical Exam: General:  Sitting in chair. NAD.  HEENT: normal Neck: supple. JVP 7 .  Carotids 2+ bilat; no bruits. No thyromegaly or nodule noted. RIJ swan  Cor: Mechanical heart sounds with LVAD hum present. + CTs Lungs: Clear. On left dull 1/3 up Abdomen: soft, NT, non-distended, no HSM. No bruits or masses. +BS  Driveline: C/D/I; securement device intact and driveline incorporated Extremities: no cyanosis, clubbing, rash, R and LLE trace  edema Neuro: Awake and oriented. Moves all 4 extremities on command and without difficulty.   Telemetry: Reviewed, V-paced 70s  Labs: Lab Results  Component Value Date   WBC 12.5 (H) 09/25/2016   HGB 9.3 (L) 09/25/2016   HCT 29.0 (L) 09/25/2016   MCV 89.8 09/25/2016   PLT 290 09/25/2016     Recent Labs Lab 09/25/16 0427  NA 132*  K 3.9  CL 88*  CO2 34*  BUN 16  CREATININE 0.90  CALCIUM 8.6*  GLUCOSE 154*   Lab Results  Component Value Date   CHOL 153 08/15/2013   HDL 37 (L) 08/15/2013   LDLCALC 89 08/15/2013   TRIG 133 08/15/2013   BNP (last 3 results)  Recent Labs  09/10/16 0451 09/16/16 0122  09/23/16 0534  BNP 1,322.6* 1,132.0* 504.1*    ProBNP (last 3 results) No results for input(s): PROBNP in the last 8760 hours.   Diagnostic Studies/Procedures   No results found.  Discharge Medications   Allergies as of 09/25/2016      Reactions   Codeine Nausea And Vomiting   Lipitor [atorvastatin] Nausea Only   Nausea with high doses, tolerates 20mg  dose (08/22/16)      Medication List    STOP taking these medications   gabapentin 100 MG capsule Commonly known as:  NEURONTIN   insulin glargine 100 unit/mL Sopn Commonly known as:  LANTUS   isosorbide mononitrate 30 MG 24 hr tablet Commonly known as:  IMDUR    losartan 25 MG tablet Commonly known as:  COZAAR   metolazone 5 MG tablet Commonly known as:  ZAROXOLYN   metoprolol succinate 100 MG 24 hr tablet Commonly known as:  TOPROL-XL   milrinone 20 MG/100 ML Soln infusion Commonly known as:  PRIMACOR   nicotine 21 mg/24hr patch Commonly known as:  NICODERM CQ - dosed in mg/24 hours   potassium chloride SA 20 MEQ tablet Commonly known as:  K-DUR,KLOR-CON     TAKE these medications   acetaminophen 500 MG tablet Commonly known as:  TYLENOL Take 1,000 mg by mouth every 6 (six) hours as needed for mild pain or headache.   albuterol 108 (90 Base) MCG/ACT inhaler Commonly known as:  PROVENTIL HFA;VENTOLIN HFA Inhale 2 puffs into the lungs every 6 (six) hours as needed for wheezing or shortness of breath.   amiodarone 200 MG tablet Commonly known as:  PACERONE Take 1 tablet (200 mg total) by mouth 2 (two) times daily.   aspirin EC 81 MG tablet Take 81 mg by mouth daily.   atorvastatin 20 MG tablet Commonly known as:  LIPITOR Take 20 mg by mouth at bedtime.   baclofen 10 MG tablet Commonly known as:  LIORESAL Take 10 mg by mouth 3 (three) times daily as needed for muscle spasms.   dextromethorphan-guaiFENesin 30-600 MG 12hr tablet Commonly known as:  MUCINEX DM Take 1 tablet by mouth 2 (two) times daily.   digoxin 0.125 MG tablet Commonly known as:  LANOXIN Take 1 tablet (0.125 mg total) by mouth daily. What changed:  See the new instructions.   docusate sodium 100 MG capsule Commonly known as:  COLACE Take 2 capsules (200 mg total) by mouth daily.   escitalopram 20 MG tablet Commonly known as:  LEXAPRO Take 1 tablet (20 mg total) by mouth daily.   feeding supplement (GLUCERNA SHAKE) Liqd Take 237 mLs by mouth 2 (two) times daily between meals.   Fish Oil 1000 MG Caps Take 1,000 mg by mouth daily with supper.   Insulin Detemir 100 UNIT/ML Pen Commonly known as:  LEVEMIR Inject 10 Units into the skin 2 (two)  times daily at 8 am and 10 pm.   magnesium oxide 400 (241.3 Mg) MG tablet Commonly known as:  MAG-OX Take 1 tablet (400 mg total) by mouth daily.   multivitamin with minerals Tabs tablet Take 1 tablet by mouth daily. Sentry/Centrum   NOVOLOG FLEXPEN 100 UNIT/ML FlexPen Generic drug:  insulin aspart Inject 10 Units into the skin 3 (three) times daily with meals.   pantoprazole 40 MG tablet Commonly known as:  PROTONIX Take 1 tablet (40 mg total) by mouth daily.   Potassium Chloride ER 20 MEQ Tbcr Take 20 mEq by mouth daily.   pregabalin 75 MG capsule  Commonly known as:  LYRICA Take 1 capsule (75 mg total) by mouth daily. Start taking on:  09/26/2016   senna 8.6 MG Tabs tablet Commonly known as:  SENOKOT Take 2 tablets (17.2 mg total) by mouth at bedtime as needed. Contact PCP for further refills, this is a courtesy refill What changed:  reasons to take this  additional instructions   spironolactone 25 MG tablet Commonly known as:  ALDACTONE Take 1 tablet (25 mg total) by mouth daily.   torsemide 20 MG tablet Commonly known as:  DEMADEX Take 2 tablets (40 mg total) by mouth daily. What changed:  when to take this   varenicline 1 MG tablet Commonly known as:  CHANTIX Take 1 mg by mouth 2 (two) times daily.   warfarin 5 MG tablet Commonly known as:  COUMADIN Take 1.5 tablets (7.5 mg total) by mouth daily. And as prescribed on blood checks   zolpidem 5 MG tablet Commonly known as:  AMBIEN Take 1 tablet (5 mg total) by mouth at bedtime as needed for sleep.       Disposition   The patient will be discharged in stable condition to home. Discharge Instructions    Amb Referral to Cardiac Rehabilitation    Complete by:  As directed    Diagnosis:   Other Heart Failure (see criteria below if ordering Phase II)     Heart Failure Type:  Chronic Systolic & Diastolic     Follow-up Information    [facility] Follow up on  09/29/2016.   Specialty:  Cardiology Why:  at 2:00 PM  in [place] information: [address] [id number] [place] [phone]            D/c meds  Coumadin INR 2.0-2.5 ASA 81 Amio 200 bid Atorva 20 Digoxin 0.125 Lexapro 20 Colace 10 Torsemide 40 daily Spiro 25 daily Kdur 20 bid Coumadin 5 mg tabs and 1.5 tabs daily discussed with pharmacy.  Instructions: Weigh daily Call clinic if weight climbs more than 3 pounds in a day or 5 pounds in a week. No salt to very little salt in your diet.  No more than 2000 mg in a day. Call if increased shortness of breath or increased swelling.   Continue LVAD instructions.   INR check on the 27th with appt.   Call for shortness of breath or any problems.      Duration of Discharge Encounter: Greater than 35 minutes  Kayda Allers,MD 11:10 PM

## 2016-09-28 ENCOUNTER — Encounter (HOSPITAL_COMMUNITY): Payer: Self-pay | Admitting: Infectious Diseases

## 2016-09-28 LAB — CULTURE, BODY FLUID-BOTTLE: CULTURE: NO GROWTH

## 2016-09-28 LAB — CULTURE, BODY FLUID W GRAM STAIN -BOTTLE

## 2016-09-29 ENCOUNTER — Inpatient Hospital Stay (HOSPITAL_COMMUNITY): Admit: 2016-09-29 | Payer: Medicare HMO

## 2016-09-29 ENCOUNTER — Telehealth: Payer: Self-pay | Admitting: Cardiology

## 2016-09-29 NOTE — Telephone Encounter (Signed)
Pharmacy was called back and they stated that the Lyrica prescription that the patient brought in was not signed. Pharmacy was notified that the provider that prescribed the Lyrica, Cecilie Kicks, FNP was not in the office today but that on 12/23 the patient was discharged from the hospital and that he was prescribed pregabalin (lyrica) 75 mg 1 capsule by mouth daily (see hospital note on 11/19). Pharmacy verbalized understanding.

## 2016-09-29 NOTE — Telephone Encounter (Signed)
New message      Calling to verify presc for lyrica 75mg .  presc was not signed

## 2016-10-01 ENCOUNTER — Ambulatory Visit (HOSPITAL_COMMUNITY)
Admission: RE | Admit: 2016-10-01 | Discharge: 2016-10-01 | Disposition: A | Discharge status: Home / Self Care | Payer: Medicare HMO | Source: Ambulatory Visit | Attending: Cardiology | Admitting: Cardiology

## 2016-10-01 ENCOUNTER — Encounter: Payer: Self-pay | Admitting: Cardiothoracic Surgery

## 2016-10-01 ENCOUNTER — Ambulatory Visit (HOSPITAL_COMMUNITY): Payer: Self-pay | Admitting: Pharmacist

## 2016-10-01 VITALS — BP 98/0 | Ht 69.0 in | Wt 225.8 lb

## 2016-10-01 DIAGNOSIS — G629 Polyneuropathy, unspecified: Secondary | ICD-10-CM | POA: Diagnosis not present

## 2016-10-01 DIAGNOSIS — F329 Major depressive disorder, single episode, unspecified: Secondary | ICD-10-CM | POA: Diagnosis not present

## 2016-10-01 DIAGNOSIS — J449 Chronic obstructive pulmonary disease, unspecified: Secondary | ICD-10-CM | POA: Insufficient documentation

## 2016-10-01 DIAGNOSIS — I4891 Unspecified atrial fibrillation: Secondary | ICD-10-CM | POA: Insufficient documentation

## 2016-10-01 DIAGNOSIS — Z95811 Presence of heart assist device: Secondary | ICD-10-CM | POA: Diagnosis not present

## 2016-10-01 DIAGNOSIS — I5022 Chronic systolic (congestive) heart failure: Secondary | ICD-10-CM | POA: Diagnosis present

## 2016-10-01 DIAGNOSIS — Z9889 Other specified postprocedural states: Secondary | ICD-10-CM | POA: Diagnosis not present

## 2016-10-01 DIAGNOSIS — I10 Essential (primary) hypertension: Secondary | ICD-10-CM

## 2016-10-01 DIAGNOSIS — Z7901 Long term (current) use of anticoagulants: Secondary | ICD-10-CM | POA: Diagnosis not present

## 2016-10-01 DIAGNOSIS — R0609 Other forms of dyspnea: Secondary | ICD-10-CM | POA: Diagnosis not present

## 2016-10-01 DIAGNOSIS — I110 Hypertensive heart disease with heart failure: Secondary | ICD-10-CM | POA: Insufficient documentation

## 2016-10-01 DIAGNOSIS — R52 Pain, unspecified: Secondary | ICD-10-CM | POA: Diagnosis not present

## 2016-10-01 DIAGNOSIS — E119 Type 2 diabetes mellitus without complications: Secondary | ICD-10-CM | POA: Insufficient documentation

## 2016-10-01 DIAGNOSIS — Z79899 Other long term (current) drug therapy: Secondary | ICD-10-CM | POA: Diagnosis not present

## 2016-10-01 DIAGNOSIS — Z794 Long term (current) use of insulin: Secondary | ICD-10-CM | POA: Diagnosis not present

## 2016-10-01 DIAGNOSIS — I2510 Atherosclerotic heart disease of native coronary artery without angina pectoris: Secondary | ICD-10-CM | POA: Insufficient documentation

## 2016-10-01 DIAGNOSIS — Z7982 Long term (current) use of aspirin: Secondary | ICD-10-CM | POA: Insufficient documentation

## 2016-10-01 DIAGNOSIS — Z951 Presence of aortocoronary bypass graft: Secondary | ICD-10-CM | POA: Diagnosis not present

## 2016-10-01 DIAGNOSIS — F1410 Cocaine abuse, uncomplicated: Secondary | ICD-10-CM | POA: Insufficient documentation

## 2016-10-01 LAB — COMPREHENSIVE METABOLIC PANEL
ALBUMIN: 3.1 g/dL — AB (ref 3.5–5.0)
ALT: 14 U/L — ABNORMAL LOW (ref 17–63)
AST: 18 U/L (ref 15–41)
Alkaline Phosphatase: 93 U/L (ref 38–126)
Anion gap: 5 (ref 5–15)
BUN: 18 mg/dL (ref 6–20)
CHLORIDE: 100 mmol/L — AB (ref 101–111)
CO2: 34 mmol/L — AB (ref 22–32)
Calcium: 9 mg/dL (ref 8.9–10.3)
Creatinine, Ser: 0.92 mg/dL (ref 0.61–1.24)
GFR calc non Af Amer: >60 mL/min (ref >60)
Glucose, Bld: 207 mg/dL — ABNORMAL HIGH (ref 65–99)
Potassium: 4.4 mmol/L (ref 3.5–5.1)
SODIUM: 139 mmol/L (ref 135–145)
Total Bilirubin: 0.4 mg/dL (ref 0.3–1.2)
Total Protein: 7.3 g/dL (ref 6.5–8.1)

## 2016-10-01 LAB — PROTIME-INR
INR: 1.75
Prothrombin Time: 20.7 seconds — ABNORMAL HIGH (ref 11.4–15.2)

## 2016-10-01 LAB — CBC
HEMATOCRIT: 31.4 % — AB (ref 39.0–52.0)
Hemoglobin: 9.7 g/dL — ABNORMAL LOW (ref 13.0–17.0)
MCH: 28.3 pg (ref 26.0–34.0)
MCHC: 30.9 g/dL (ref 30.0–36.0)
MCV: 91.5 fL (ref 78.0–100.0)
Platelets: 236 10*3/uL (ref 150–400)
RBC: 3.43 MIL/uL — AB (ref 4.22–5.81)
RDW: 15.6 % — ABNORMAL HIGH (ref 11.5–15.5)
WBC: 8.5 10*3/uL (ref 4.0–10.5)

## 2016-10-01 LAB — LACTATE DEHYDROGENASE: LDH: 187 U/L (ref 98–192)

## 2016-10-01 LAB — BRAIN NATRIURETIC PEPTIDE: B NATRIURETIC PEPTIDE 5: 524.2 pg/mL — AB (ref 0.0–100.0)

## 2016-10-01 MED ORDER — TRAMADOL HCL 50 MG PO TABS: 50.0000 mg | ORAL_TABLET | Freq: Two times a day (BID) | ORAL | 0 refills | Status: DC | PRN

## 2016-10-01 MED ORDER — LOSARTAN POTASSIUM 25 MG PO TABS: 25.0000 mg | ORAL_TABLET | Freq: Every day | ORAL | 0 refills | Status: DC

## 2016-10-01 MED ORDER — AMIODARONE HCL 200 MG PO TABS: 200.0000 mg | ORAL_TABLET | Freq: Every day | ORAL | 6 refills | Status: DC

## 2016-10-01 MED ORDER — GABAPENTIN 100 MG PO CAPS: 200.0000 mg | ORAL_CAPSULE | Freq: Two times a day (BID) | ORAL | 3 refills | Status: DC

## 2016-10-01 NOTE — Progress Notes (Signed)
Patient presents for hospital DC FU in [facility] today. Reports no problems with VAD equipment or concerns with drive line.  Vital Signs:  Doppler Pressure: 98 - 100 Automatc BP: not picking up  HR: 93 (difficult to pick up) SPO2: 94% (difficult to pick up)  Weight: 225.8 lb w/o eqt Last weight: 219 lb (HOSPITAL DISCHARGE) Home weights: 213 - 216 lbs Body mass index is 33.34 kg/m.   VAD Indication: HM3 LVAD 09/09/2016 Destination Therapy - Evaluation completed at [facility] - unable to continue process d/t +nicotine / cocaine).   VAD interrogation & Equipment Management: Speed: 5900 Flow: 5.5 Power: 3.0 w    PI: 3.0  Alarms: no clinical alarms  Events: 25 - 42 x 2 days (other days on interrogation partial days  Fixed speed: 5900 Low speed limit: 5600  Primary Controller:  Replace back up battery in 30 months. Back up controller:   Replace back up battery in 30 months.  Annual Equipment Maintenance on UBC/PM was performed on Dec 2017. **MPU batteries due to be changed 03/2016   I reviewed the LVAD parameters from today and compared the results to the patient's prior recorded data and reviewed with Dr. Aundra Dubin. LVAD interrogation was NEGATIVE for significant power changes, NEGATIVE for clinical alarms and INCREASED for PI events/speed drops. No programming changes were made and pump is functioning within specified parameters. Pt is performing daily controller and system monitor self tests along with completing weekly and monthly maintenance for LVAD equipment.  LVAD equipment check completed and is in good working order. Back-up equipment present.   Exit Site Care: Drive line is being maintained daily by mother / sister. Drive line exit site with some erythema and serous drainage on Aquacel strip. Both sutures remain intact - distal suture to be removed per standard of care next week at 4w post op. The velour is fully implanted at exit site. Stabilization device  present and accurately applied. Pt denies fever or chills. 1 box of Aquacel strips provided today during clinic visit.      Red papular pruritic rash noted at tape boarders. He had allergic issues with the PICC line pre-vad so anticipated this post-VAD. Discussed previously with Dr. Prescott Gum and may need to revert to betadyne swab sticks / sterile saline to keep clean.  Repositioned anchor - 2 anchors afford too much of a dramatic bend in the driveline with anatomy/position of exit site. Therefore have instructed on proper positioning of one anchor for now to minimize tension at the exit site.   Significant Events on VAD Support:  none  Device: Protecta Therapies: Off from surgery (patient requests for therapies to stay off) Last check: 09/09/16  BP & Labs:  MAP 98 - 100 - Doppler is interpreted best as MAP   Hgb 9.7 - No S/S of bleeding. Specifically denies melena/BRBPR or nosebleeds.  LDH stable at 187 with established baseline of 180 - 250. Denies tea-colored urine. No power elevations noted on interrogation.    [name], RN VAD Coordinator   Office: [phone] 24/7 Emergency VAD Pager: [phone]

## 2016-10-01 NOTE — Progress Notes (Signed)
Advanced Heart Failure Clinic Note   Referring Provider: Darylene Price, FNP Primary Care: [facility] Primary Cardiologist: Dr Haroldine Laws.   HPI:  Ronald Miller. is a 60 y.o. male with history of chronic systolic HF s/p Medtronic ICD 2014, CAD s/p CABG in 2000, HTN, Hx of cocaine abuse, Tobacco abuse, Depression, and COPD.   Underwent HM 3 placement 09/09/16. Post-op course complicated by RV failure, volume overload, left pleural effusion and PAF. He was diuresed successfully. Milrinone weaned to off. Underwent ramp echo prior to d/c and speed adjusted. Also underwent left thoracentesis with IR (600 cc off) with moderate improvement in size of effusion. Warfarin loaded. INR 2.0. Co-ox on date of d/c was 52% but he was ambulating well without symptoms. PICC line pulled prior to discharge.   He presents today for post hospital follow up.  Overall feeling better. Remains somewhat sore. Having occasional nausea. Doesn't have anything prescribed for breakthrough pain. Taking tylenol 1000 mg 2-3 times a day. Denies BRBPR, epistaxis, or melena. Still having occasional mucous production with cough. No fever or chills. Walking several times a day.   Denies LVAD alarms.  Denies driveline trauma, erythema or drainage.  Denies ICD shocks.   Reports taking Coumadin as prescribed and adherence to anticoagulation based dietary restrictions.  Denies bright red blood per rectum or melena, no dark urine or hematuria.    LVAD Interrogation HM III:   Speed: 5900 Flow: 4.9  PI: 3.0 Power: 5.9    Back-up speed: 5600  > 25 - 40 PI events.  RHC 03/22/16 RA = 23 RV = 46/25 PA = 49/28 (36) PCW = 27 Fick cardiac output/index = 4.9/2.2 Thermo CO/Ci = 4.1/1.8 PVR = 2.2 WU Ao sat = 89% PA sat = 49%, 53% RA/PCWP = 0.85  R/LHC 02/12/16  Prox RCA lesion, 100% stenosed.  Prox Cx to Dist Cx lesion, 95% stenosed.  1st Mrg lesion, 100% stenosed.  2nd Mrg lesion, 100% stenosed.  Prox LAD  lesion, 100% stenosed.  Ost LM to LM lesion, 50% stenosed.  Origin lesion, 100% stenosed.  Origin lesion, 100% stenosed.  Origin lesion, 100% stenosed.  Findings: Ao = 82/58 (69) LV = 84/15/27 RA = 14 RV = 45/11/17 PA = 49/24 (31) PCW = 28 Fick cardiac output/index = 5.3/2.4 PVR = 0.6 WU SVR = 837 FA sat = 98% PA sat = 63%, 62%  Labs (12/17): K 4.4, creatinine 0.92, LFTs normal, BNP 524, hgb 9.7, LDH 187, INR 1.75  Past Medical History:  Diagnosis Date  . AICD (automatic cardioverter/defibrillator) present   . ASCVD (arteriosclerotic cardiovascular disease)   . Chronic systolic CHF (congestive heart failure) ([place])   . COPD (chronic obstructive pulmonary disease) ([place])   . Coronary artery disease   . Depression   . Diabetes mellitus   . History of cocaine abuse   . Hypertension   . Presence of permanent cardiac pacemaker   . Shortness of breath dyspnea   . Suicidal ideation   . Tobacco abuse     Current Outpatient Prescriptions  Medication Sig Dispense Refill  . acetaminophen (TYLENOL) 500 MG tablet Take 1,000 mg by mouth every 6 (six) hours as needed for mild pain or headache.    . albuterol (PROVENTIL HFA;VENTOLIN HFA) 108 (90 BASE) MCG/ACT inhaler Inhale 2 puffs into the lungs every 6 (six) hours as needed for wheezing or shortness of breath.    Marland Kitchen amiodarone (PACERONE) 200 MG tablet Take 1 tablet (200 mg total)  by mouth 2 (two) times daily. 60 tablet 6  . aspirin EC 81 MG tablet Take 81 mg by mouth daily.     Marland Kitchen atorvastatin (LIPITOR) 20 MG tablet Take 20 mg by mouth at bedtime.     Marland Kitchen dextromethorphan-guaiFENesin (MUCINEX DM) 30-600 MG 12hr tablet Take 1 tablet by mouth 2 (two) times daily. 60 tablet 0  . digoxin (LANOXIN) 0.125 MG tablet Take 1 tablet (0.125 mg total) by mouth daily. 30 tablet 6  . docusate sodium (COLACE) 100 MG capsule Take 2 capsules (200 mg total) by mouth daily. 30 capsule 6  . escitalopram (LEXAPRO) 20 MG tablet Take 1 tablet (20 mg  total) by mouth daily. 30 tablet 0  . insulin aspart (NOVOLOG FLEXPEN) 100 UNIT/ML FlexPen Inject 10 Units into the skin 3 (three) times daily with meals.    . Insulin Detemir (LEVEMIR) 100 UNIT/ML Pen Inject 10 Units into the skin 2 (two) times daily at 8 am and 10 pm. 15 pen 11  . magnesium oxide (MAG-OX) 400 (241.3 Mg) MG tablet Take 1 tablet (400 mg total) by mouth daily. 30 tablet 0  . Multiple Vitamin (MULTIVITAMIN WITH MINERALS) TABS tablet Take 1 tablet by mouth daily. Sentry/Centrum    . Omega-3 Fatty Acids (FISH OIL) 1000 MG CAPS Take 1,000 mg by mouth daily with supper.     . pantoprazole (PROTONIX) 40 MG tablet Take 1 tablet (40 mg total) by mouth daily. 30 tablet 6  . potassium chloride 20 MEQ TBCR Take 20 mEq by mouth daily. 60 tablet 6  . pregabalin (LYRICA) 75 MG capsule Take 1 capsule (75 mg total) by mouth daily. 30 capsule 6  . senna (SENOKOT) 8.6 MG TABS tablet Take 2 tablets (17.2 mg total) by mouth at bedtime as needed. Contact PCP for further refills, this is a courtesy refill (Patient taking differently: Take 2 tablets by mouth at bedtime as needed (constipation). ) 180 each 0  . spironolactone (ALDACTONE) 25 MG tablet Take 1 tablet (25 mg total) by mouth daily. 90 tablet 1  . torsemide (DEMADEX) 20 MG tablet Take 2 tablets (40 mg total) by mouth daily.    Marland Kitchen warfarin (COUMADIN) 5 MG tablet Take 1.5 tablets (7.5 mg total) by mouth daily. And as prescribed on blood checks 60 tablet 11  . baclofen (LIORESAL) 10 MG tablet Take 10 mg by mouth 3 (three) times daily as needed for muscle spasms.    . feeding supplement, GLUCERNA SHAKE, (GLUCERNA SHAKE) LIQD Take 237 mLs by mouth 2 (two) times daily between meals. (Patient not taking: Reported on 10/01/2016)  0   No current facility-administered medications for this encounter.     Codeine and Lipitor [atorvastatin]  REVIEW OF SYSTEMS: All systems negative except as listed in HPI, PMH and Problem list.  Vitals:   10/01/16 1202    BP: (!) 98/0  Weight: 225 lb 12.8 oz (102.4 kg)  Height: 5\' 9"  (1.753 m)   MAP 98  Wt Readings from Last 3 Encounters:  10/01/16 225 lb 12.8 oz (102.4 kg)  09/25/16 219 lb 1.6 oz (99.4 kg)  08/18/16 225 lb (102.1 kg)    Physical Exam: GENERAL: NAD HEENT: Normal NECK: Supple, JVP 7-8 cm.  2+ bilaterally, no bruits.  No lymphadenopathy or thyromegaly appreciated.   CARDIAC:  Mechanical heart sounds with LVAD hum present.  LUNGS:  Clear to auscultation bilaterally.  ABDOMEN:  Soft, round, nontender, positive bowel sounds x4.     LVAD exit site: well-healed  and incorporated.  Dressing dry and intact.  No erythema or drainage.  Stabilization device present and accurately applied.  Driveline dressing is being changed daily per sterile technique. EXTREMITIES:  Warm and dry, no cyanosis, clubbing, rash or edema  NEUROLOGIC:  Alert and oriented x 4.  Gait steady.  No aphasia.  No dysarthria.  Affect pleasant.     ASSESSMENT AND PLAN:  1. Chronic end-stage biventricular systolic HF: LVEF 0000000 due to ICM -> s/p Echo 08/24/16 LVEF 15%, RV mild dilated, moderately reduced. 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   - Volume status stable on 40 mg torsemide daily.  - Stable symptomatically off milrinone. No PICC to check coox.   - Continue dig 0.125 mg daily. Recheck level next visit.   - Warfarin with goal INR 2.0 - 2.5 + ASA 81 daily. INR 1.75 today. Pharmacy to dose.  - Map 98. Will start on losartan 25 mg daily today.  - Had begun work up for Transplant via [facility]. Will need to be free from Tobacco and cocaine x 6 months before work up can continue.  - Will give tramadol 50 mg BID prn for breakthrough pain - Switch lyrica back to Gabapentin 200 mg BID for neuropathic surgical site pain. He feels like this worked better and feels like lyrica is making him nauseated. 2. CAD: Denies CP. 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  3. DMII: Hgb A1C 8.3 on 08/26/2016 4. Tobacco  abuse - Has not smoked since prior to admission for LVAD.  - Congratulated on his cessation and encouraged not to resume.  5. Atrial fibrillation - NSR on amio.  - Decrease amiodarone to 200 mg daily.    I reviewed the LVAD parameters from today, and compared the results to the patient's prior recorded data.  No programming changes were made.  The LVAD is functioning within specified parameters.  The patient performs LVAD self-test daily.  LVAD interrogation was negative for any significant power changes, alarms or PI events/speed drops.  LVAD equipment check completed and is in good working order.  Back-up equipment present.   LVAD education done on emergency procedures and precautions and reviewed exit site care.   [name] Friar, PA-C 10/01/16   Patient seen with PA, agree with the above note.  Mr Beach is doing well post-op, he is off milrinone.  He is walking short distances without dyspnea.  Still limited by pain.  As above, will give him tramadol prn and will stop Lyrica/start gabapentin.  MAP elevated, will add losartan 25 mg daily. Volume status is stable, continue current torsemide.  Can decrease amiodarone to 200 mg daily.   Loralie Champagne 10/02/2016

## 2016-10-01 NOTE — Patient Instructions (Addendum)
DECREASE Amiodarone to 200 mg ONCE a day  START Losartan ONE pill (25mg ) every day (blood pressure pill)  STOP Lyrica and START Gabapentin TWO pills TWICE a day.   STOP CHG Sticks (Pop Sticks to clean the site) for a week to see if the rash improves. STOP Skin protectant (silver packet swab).

## 2016-10-07 ENCOUNTER — Encounter (HOSPITAL_COMMUNITY): Payer: Self-pay

## 2016-10-07 ENCOUNTER — Ambulatory Visit (HOSPITAL_COMMUNITY)
Admission: RE | Admit: 2016-10-07 | Discharge: 2016-10-07 | Disposition: A | Discharge status: Home / Self Care | Payer: Medicare HMO | Source: Ambulatory Visit | Attending: Internal Medicine | Admitting: Internal Medicine

## 2016-10-07 ENCOUNTER — Ambulatory Visit (HOSPITAL_COMMUNITY): Payer: Self-pay | Admitting: Infectious Diseases

## 2016-10-07 ENCOUNTER — Telehealth (HOSPITAL_COMMUNITY): Payer: Self-pay | Admitting: Infectious Diseases

## 2016-10-07 VITALS — BP 90/0 | HR 75 | Wt 223.0 lb

## 2016-10-07 DIAGNOSIS — I5022 Chronic systolic (congestive) heart failure: Secondary | ICD-10-CM | POA: Insufficient documentation

## 2016-10-07 DIAGNOSIS — Z7982 Long term (current) use of aspirin: Secondary | ICD-10-CM | POA: Insufficient documentation

## 2016-10-07 DIAGNOSIS — I517 Cardiomegaly: Secondary | ICD-10-CM | POA: Insufficient documentation

## 2016-10-07 DIAGNOSIS — I4891 Unspecified atrial fibrillation: Secondary | ICD-10-CM | POA: Insufficient documentation

## 2016-10-07 DIAGNOSIS — G629 Polyneuropathy, unspecified: Secondary | ICD-10-CM

## 2016-10-07 DIAGNOSIS — Z95811 Presence of heart assist device: Secondary | ICD-10-CM

## 2016-10-07 DIAGNOSIS — R918 Other nonspecific abnormal finding of lung field: Secondary | ICD-10-CM | POA: Insufficient documentation

## 2016-10-07 DIAGNOSIS — K5903 Drug induced constipation: Secondary | ICD-10-CM | POA: Diagnosis not present

## 2016-10-07 DIAGNOSIS — Z7901 Long term (current) use of anticoagulants: Secondary | ICD-10-CM | POA: Diagnosis not present

## 2016-10-07 DIAGNOSIS — E119 Type 2 diabetes mellitus without complications: Secondary | ICD-10-CM | POA: Diagnosis not present

## 2016-10-07 DIAGNOSIS — J90 Pleural effusion, not elsewhere classified: Secondary | ICD-10-CM

## 2016-10-07 DIAGNOSIS — I2510 Atherosclerotic heart disease of native coronary artery without angina pectoris: Secondary | ICD-10-CM | POA: Diagnosis not present

## 2016-10-07 DIAGNOSIS — Z87891 Personal history of nicotine dependence: Secondary | ICD-10-CM | POA: Diagnosis not present

## 2016-10-07 DIAGNOSIS — Z9889 Other specified postprocedural states: Secondary | ICD-10-CM | POA: Insufficient documentation

## 2016-10-07 DIAGNOSIS — K5900 Constipation, unspecified: Secondary | ICD-10-CM | POA: Insufficient documentation

## 2016-10-07 DIAGNOSIS — I480 Paroxysmal atrial fibrillation: Secondary | ICD-10-CM

## 2016-10-07 DIAGNOSIS — Z794 Long term (current) use of insulin: Secondary | ICD-10-CM | POA: Diagnosis not present

## 2016-10-07 LAB — COMPREHENSIVE METABOLIC PANEL
ALT: 15 U/L — ABNORMAL LOW (ref 17–63)
ANION GAP: 7 (ref 5–15)
AST: 23 U/L (ref 15–41)
Albumin: 3.4 g/dL — ABNORMAL LOW (ref 3.5–5.0)
Alkaline Phosphatase: 107 U/L (ref 38–126)
BUN: 23 mg/dL — ABNORMAL HIGH (ref 6–20)
CHLORIDE: 99 mmol/L — AB (ref 101–111)
CO2: 32 mmol/L (ref 22–32)
Calcium: 9 mg/dL (ref 8.9–10.3)
Creatinine, Ser: 0.97 mg/dL (ref 0.61–1.24)
Glucose, Bld: 169 mg/dL — ABNORMAL HIGH (ref 65–99)
POTASSIUM: 4.4 mmol/L (ref 3.5–5.1)
Sodium: 138 mmol/L (ref 135–145)
TOTAL PROTEIN: 7.6 g/dL (ref 6.5–8.1)
Total Bilirubin: 0.3 mg/dL (ref 0.3–1.2)

## 2016-10-07 LAB — CBC
HCT: 33.1 % — ABNORMAL LOW (ref 39.0–52.0)
Hemoglobin: 10.4 g/dL — ABNORMAL LOW (ref 13.0–17.0)
MCH: 28.7 pg (ref 26.0–34.0)
MCHC: 31.4 g/dL (ref 30.0–36.0)
MCV: 91.2 fL (ref 78.0–100.0)
Platelets: 210 10*3/uL (ref 150–400)
RBC: 3.63 MIL/uL — ABNORMAL LOW (ref 4.22–5.81)
RDW: 16.1 % — AB (ref 11.5–15.5)
WBC: 7.5 10*3/uL (ref 4.0–10.5)

## 2016-10-07 LAB — LACTATE DEHYDROGENASE: LDH: 210 U/L — AB (ref 98–192)

## 2016-10-07 LAB — PROTIME-INR
INR: 2.38
PROTHROMBIN TIME: 26.4 s — AB (ref 11.4–15.2)

## 2016-10-07 LAB — DIGOXIN LEVEL: DIGOXIN LVL: 0.6 ng/mL — AB (ref 0.8–2.0)

## 2016-10-07 LAB — BRAIN NATRIURETIC PEPTIDE: B NATRIURETIC PEPTIDE 5: 387.6 pg/mL — AB (ref 0.0–100.0)

## 2016-10-07 LAB — TSH: TSH: 3.694 u[IU]/mL (ref 0.350–4.500)

## 2016-10-07 MED ORDER — POLYETHYLENE GLYCOL 3350 17 G PO PACK: 17.0000 g | PACK | Freq: Every day | ORAL | Status: DC

## 2016-10-07 MED ORDER — GABAPENTIN 100 MG PO CAPS: 200.0000 mg | ORAL_CAPSULE | Freq: Two times a day (BID) | ORAL | 3 refills | Status: DC

## 2016-10-07 NOTE — Progress Notes (Addendum)
Patient presents for hospital DC FU in [facility] today. Reports no problems with VAD equipment or concerns with drive line.  Vital Signs:  Doppler Pressure: 88 - 94 Automatc BP: 129/78 (105) HR: 94 SPO2: 98% (difficult to pick up)  Weight: 225.8 lb w/o eqt Last weight: 225.8 lb ([place]) Home weights: 213 - 216 lbs Body mass index is 32.93 kg/m.   VAD Indication: HM3 LVAD 09/09/2016 Destination Therapy - Evaluation completed at [facility] - unable to continue process d/t +nicotine / cocaine).   VAD interrogation & Equipment Management: Speed: 5900 Flow: 5.5 Power: 3.0 w    PI: 3.0  Alarms: no clinical alarms  Events: 5 - 10 daily   Fixed speed: 5900 Low speed limit: 5600  Primary Controller:  Replace back up battery in 30 months. Back up controller:   Replace back up battery in 30 months.  Annual Equipment Maintenance on UBC/PM was performed on Dec 2017. **MPU batteries due to be changed 03/2016   I reviewed the LVAD parameters from today and compared the results to the patient's prior recorded data and reviewed with Dr. Aundra Dubin. LVAD interrogation was NEGATIVE for significant power changes, NEGATIVE for clinical alarms and IMPROVED for PI events/speed drops. No programming changes were made and pump is functioning within specified parameters. Pt is performing daily controller and system monitor self tests along with completing weekly and monthly maintenance for LVAD equipment.  LVAD equipment check completed and is in good working order. Back-up equipment present.   Exit Site Care: Drive line is being maintained daily by mother / sister. Drive line exit site with some erythema, no drainage on Aquacel strip. Distal suture removed per standard of care post-VAD. The velour is fully implanted at exit site. Tissue ingrowth progressing well. Stabilization device present and accurately applied. Pt denies fever or chills. Patient has enough supplies at home.  Progressed to every other day as d/w Dr. Haroldine Laws.    Red papular pruritic rash previously noted subsided. Used CHG x 1 today and cleansed with Saline after. NO SKIN PREP.   Significant Events on VAD Support:  none  Device: Protecta Therapies: Off from surgery (patient requests for therapies to stay off) Last check: 09/09/16  BP & Labs:  MAP 88 - 90 - Doppler is interpreted as MAP   Hgb 10.4 - No S/S of bleeding. Specifically denies melena/BRBPR or nosebleeds.  LDH stable at 210 with established baseline of 180 - 250. Denies tea-colored urine. No power elevations noted on interrogation.    [name], RN VAD Coordinator   Office: [phone]  24/7 Emergency VAD Pager: [phone]

## 2016-10-07 NOTE — Progress Notes (Signed)
Advanced Heart Failure Clinic Note   Referring Provider: Darylene Price, FNP Primary Care: [facility] Primary Cardiologist: Dr Haroldine Laws.   HPI:  Ronald Miller. is a 61 y.o. male with history of chronic systolic HF s/p Medtronic ICD 2014, CAD s/p CABG in 2000, HTN, Hx of cocaine abuse, Tobacco abuse, Depression, and COPD.   Underwent HM 3 placement 09/09/16. Post-op course complicated by RV failure, volume overload, left pleural effusion and PAF. He was diuresed successfully. Milrinone weaned to off. Underwent ramp echo prior to d/c and speed adjusted. Also underwent left thoracentesis with IR (600 cc off) with moderate improvement in size of effusion. Warfarin loaded. INR 2.0. Co-ox on date of d/c was 52% but he was ambulating well without symptoms. PICC line pulled prior to discharge.   He presents today for VAD follow up.  Continues to feel better. More active. Continues with some pocket pain. MAP improved with addition of losartan at last visit. c/o constipation.No orthopnea, PND or edema.  Denies BRBPR or melena. Minor epistaxis. No fever or chills. Denies smoking or drugs.   Denies LVAD alarms.  Denies driveline trauma, erythema or drainage.  Denies ICD shocks.   Reports taking Coumadin as prescribed and adherence to anticoagulation based dietary restrictions.  Denies bright red blood per rectum or melena, no dark urine or hematuria.    VAD Indication: HM3 LVAD 09/09/2016 Destination Therapy- Evaluation completed at [facility] - unable to continue process d/t +nicotine / cocaine).   VAD interrogation & Equipment Management: Speed: 5900 Flow: 5.5 Power: 3.0 w PI: 3.0  Alarms: no clinical alarms Events: 5 - 10 daily   Fixed speed: 5900 Low speed limit: 5600   Labs (12/17): K 4.4, creatinine 0.92, LFTs normal, BNP 524, hgb 9.7, LDH 187, INR 1.75  Past Medical History:  Diagnosis Date  . AICD (automatic cardioverter/defibrillator) present   .  ASCVD (arteriosclerotic cardiovascular disease)   . Chronic systolic CHF (congestive heart failure) ([place])   . COPD (chronic obstructive pulmonary disease) ([place])   . Coronary artery disease   . Depression   . Diabetes mellitus   . History of cocaine abuse   . Hypertension   . Presence of permanent cardiac pacemaker   . Shortness of breath dyspnea   . Suicidal ideation   . Tobacco abuse     Current Outpatient Prescriptions  Medication Sig Dispense Refill  . acetaminophen (TYLENOL) 500 MG tablet Take 1,000 mg by mouth every 6 (six) hours as needed for mild pain or headache.    . albuterol (PROVENTIL HFA;VENTOLIN HFA) 108 (90 BASE) MCG/ACT inhaler Inhale 2 puffs into the lungs every 6 (six) hours as needed for wheezing or shortness of breath.    Marland Kitchen amiodarone (PACERONE) 200 MG tablet Take 1 tablet (200 mg total) by mouth daily. 60 tablet 6  . aspirin EC 81 MG tablet Take 81 mg by mouth daily.     Marland Kitchen atorvastatin (LIPITOR) 20 MG tablet Take 20 mg by mouth at bedtime.     . baclofen (LIORESAL) 10 MG tablet Take 10 mg by mouth 3 (three) times daily as needed for muscle spasms.    Marland Kitchen dextromethorphan-guaiFENesin (MUCINEX DM) 30-600 MG 12hr tablet Take 1 tablet by mouth 2 (two) times daily. 60 tablet 0  . digoxin (LANOXIN) 0.125 MG tablet Take 1 tablet (0.125 mg total) by mouth daily. 30 tablet 6  . docusate sodium (COLACE) 100 MG capsule Take 2 capsules (200 mg total) by mouth daily.  30 capsule 6  . escitalopram (LEXAPRO) 20 MG tablet Take 1 tablet (20 mg total) by mouth daily. 30 tablet 0  . insulin aspart (NOVOLOG FLEXPEN) 100 UNIT/ML FlexPen Inject 10 Units into the skin 3 (three) times daily with meals.    . Insulin Detemir (LEVEMIR) 100 UNIT/ML Pen Inject 10 Units into the skin 2 (two) times daily at 8 am and 10 pm. 15 pen 11  . losartan (COZAAR) 25 MG tablet Take 1 tablet (25 mg total) by mouth daily. 30 tablet 0  . magnesium oxide (MAG-OX) 400 (241.3 Mg) MG tablet Take 1 tablet (400 mg  total) by mouth daily. 30 tablet 0  . Multiple Vitamin (MULTIVITAMIN WITH MINERALS) TABS tablet Take 1 tablet by mouth daily. Sentry/Centrum    . Omega-3 Fatty Acids (FISH OIL) 1000 MG CAPS Take 1,000 mg by mouth daily with supper.     . pantoprazole (PROTONIX) 40 MG tablet Take 1 tablet (40 mg total) by mouth daily. 30 tablet 6  . potassium chloride 20 MEQ TBCR Take 20 mEq by mouth daily. 60 tablet 6  . senna (SENOKOT) 8.6 MG TABS tablet Take 2 tablets (17.2 mg total) by mouth at bedtime as needed. Contact PCP for further refills, this is a courtesy refill (Patient taking differently: Take 2 tablets by mouth at bedtime as needed (constipation). ) 180 each 0  . spironolactone (ALDACTONE) 25 MG tablet Take 1 tablet (25 mg total) by mouth daily. 90 tablet 1  . torsemide (DEMADEX) 20 MG tablet Take 2 tablets (40 mg total) by mouth daily.    . traMADol (ULTRAM) 50 MG tablet Take 1 tablet (50 mg total) by mouth every 12 (twelve) hours as needed for severe pain. 60 tablet 0  . warfarin (COUMADIN) 5 MG tablet Take 1.5 tablets (7.5 mg total) by mouth daily. And as prescribed on blood checks 60 tablet 11  . gabapentin (NEURONTIN) 100 MG capsule Take 2 capsules (200 mg total) by mouth 2 (two) times daily. 120 capsule 3   Current Facility-Administered Medications  Medication Dose Route Frequency Provider Last Rate Last Dose  . polyethylene glycol (MIRALAX / GLYCOLAX) packet 17 g  17 g Oral Daily Jolaine Artist, MD        Codeine and Lipitor [atorvastatin]  REVIEW OF SYSTEMS: All systems negative except as listed in HPI, PMH and Problem list.  Vitals:   10/07/16 1403 10/07/16 1408 10/07/16 1433 10/07/16 1434  BP: 129/78 (!) 94/0 (!) 88/0 (!) 90/0  Pulse: 75     SpO2: 98%     Weight: 223 lb (101.2 kg)      Vital Signs:  Doppler Pressure: 88 - 94 Automatc BP: 129/78 (105) HR: 94 SPO2: 98% (difficult to pick up)  Weight: 225.8 lb w/o eqt Last weight: 225.8 lb ([place]) Home  weights: 213 - 216 lbs Body mass index is 32.93 kg/m.   Wt Readings from Last 3 Encounters:  10/07/16 223 lb (101.2 kg)  10/01/16 225 lb 12.8 oz (102.4 kg)  09/25/16 219 lb 1.6 oz (99.4 kg)    Physical Exam: GENERAL: NAD HEENT: Normal NECK: Supple, JVP 7 cm.  2+ bilaterally, no bruits.  No lymphadenopathy or thyromegaly appreciated.   CARDIAC:  Mechanical heart sounds with LVAD hum present.  LUNGS:  Clear to auscultation bilaterally. Dull breath sounds 1/2 way up on left.   ABDOMEN:  Soft, round, nontender, positive bowel sounds x4.     LVAD exit site: well-healed and incorporated.  Dressing  dry and intact.  No erythema or drainage.  Stabilization device present and accurately applied.  Driveline dressing is being changed daily per sterile technique. EXTREMITIES:  Warm and dry, no cyanosis, clubbing, rash or edema  NEUROLOGIC:  Alert and oriented x 4.  Gait steady.  No aphasia.  No dysarthria.  Affect pleasant.     ASSESSMENT AND PLAN:  1. Chronic end-stage biventricular systolic HF: LVEF 0000000 due to ICM -> s/p Echo 08/24/16 LVEF 15%, RV mild dilated, moderately reduced. 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   - Continues to improved s/p VAD. NYHA II -VAD parameters stable - Volume status stable on 40 mg torsemide daily.  - Continue dig 0.125 mg daily. Recheck level next visit.   - Warfarin with goal INR 2.0 - 2.5 + ASA 81 daily. INR 2.38  today. Pharmacy to dose.  - MAP improved with recent addition of losartan. Can titrate as needed.  - Had begun work up for Transplant via [facility]. Will need to be free from Tobacco and cocaine x 6 months before work up can continue. Will start routine screens -Continue gabapentin 200 mg BID and ultram for neuropathic surgical site pain.  - Add miralax for constipation 2. CAD: Denies CP. 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  3. DMII: Hgb A1C 8.3 on 08/26/2016 4. Tobacco abuse - Has not smoked since prior to admission for  LVAD.  - Congratulated on his cessation and encouraged not to resume.  - Will begin routine cotinine screens as part of transplant w/u 5. Atrial fibrillation - NSR on amio.  - Amiodarone decreased to 200 mg daily at last visit. Consider stopping in 1 month.   6. Left pleural effusion -persistently dull on exam about 1/2 way up on left. CXR confirms moderate to large effusion. Will send for repeat thoracentesis with IR.   Glori Bickers MD 10/07/2016

## 2016-10-07 NOTE — Progress Notes (Signed)
CSW met with patient in the clinic. Patient reports he has not been out of the house since returning home from the hospital. Patient stated "it feels good to get out". Patient was escorted to his appointment today with his father who he had been slightly estranged from for sometime. Patient spoke of challenges with recovery and concerns with going out of the house. "I just don't want to leave the house". CSW reminded him of how he felt today getting out and moving around. Patient agreed and said would consider finding some outings to reengage in daily living. Patient appears to be a little overwhelmed but open to supportive interventions. CSW will continue to follow for support and encourage patient to attend LVAD Support Group to assist with adjustment to life with an LVAD. Raquel Sarna, LCSW [phone]

## 2016-10-07 NOTE — Telephone Encounter (Signed)
Patient reported he was not able to have Gabapentin refilled. [place] [facility] - report that it is available for <$3.00.   Called patient to update. Also informed him of CXR results and need to schedule IR thoracentesis. Will callback tomorrow to schedule.   [name], RN VAD Coordinator   Office: [phone] 24/7 Emergency VAD Pager: [phone]

## 2016-10-07 NOTE — Patient Instructions (Addendum)
Continue your Coumadin 1.5 pills every day.   Miralax as prescribed for bowel movement (one every 1-2 days). Start with once every other day for now.   Dulcolax is another medication you can try - over the counter   You can perform every other day dressing changes for your driveline site. We took out a suture today.   Back to see [place] in 1 week.   Nosebleeds:  -use a humidifier in the house  -saline ointment to the insides of your nose  -ocean spray (saline nose spray) to help -Afrin Nose spray - you can use this as a last resort , 1 spray to bleeding nostril up to twice a day only for short term.  -If it does not stop in an hour, call VAD Pager. May need to go to emergency room.

## 2016-10-08 NOTE — Telephone Encounter (Signed)
Thoracentesis sch for Wed 1/10 at 10 am, pt aware, also advised him to hold Coumadin Tue 1/9 he is agreeable

## 2016-10-08 NOTE — Addendum Note (Signed)
Addended by: Scarlette Calico on: 10/08/2016 03:52 PM   Modules accepted: Orders

## 2016-10-08 NOTE — Addendum Note (Signed)
Addended by: Scarlette Calico on: 10/08/2016 12:46 PM   Modules accepted: Orders

## 2016-10-09 LAB — NICOTINE/COTININE METABOLITES
COTININE: NOT DETECTED ng/mL
Nicotine: NOT DETECTED ng/mL

## 2016-10-13 ENCOUNTER — Ambulatory Visit (HOSPITAL_COMMUNITY): Payer: Self-pay | Admitting: Pharmacist

## 2016-10-13 ENCOUNTER — Ambulatory Visit (HOSPITAL_COMMUNITY)
Admission: RE | Admit: 2016-10-13 | Discharge: 2016-10-13 | Disposition: A | Discharge status: Home / Self Care | Payer: Medicare HMO | Source: Ambulatory Visit | Attending: General Surgery | Admitting: General Surgery

## 2016-10-13 ENCOUNTER — Ambulatory Visit (HOSPITAL_COMMUNITY)
Admission: RE | Admit: 2016-10-13 | Discharge: 2016-10-13 | Disposition: A | Discharge status: Home / Self Care | Payer: Medicare HMO | Source: Ambulatory Visit | Attending: Cardiology | Admitting: Cardiology

## 2016-10-13 ENCOUNTER — Ambulatory Visit (HOSPITAL_COMMUNITY)
Admission: RE | Admit: 2016-10-13 | Discharge: 2016-10-13 | Disposition: A | Discharge status: Home / Self Care | Payer: Medicare HMO | Source: Ambulatory Visit | Attending: Internal Medicine | Admitting: Internal Medicine

## 2016-10-13 VITALS — BP 117/92 | Ht 69.0 in | Wt 219.6 lb

## 2016-10-13 DIAGNOSIS — I255 Ischemic cardiomyopathy: Secondary | ICD-10-CM | POA: Diagnosis not present

## 2016-10-13 DIAGNOSIS — Z87891 Personal history of nicotine dependence: Secondary | ICD-10-CM | POA: Insufficient documentation

## 2016-10-13 DIAGNOSIS — F329 Major depressive disorder, single episode, unspecified: Secondary | ICD-10-CM | POA: Insufficient documentation

## 2016-10-13 DIAGNOSIS — Z951 Presence of aortocoronary bypass graft: Secondary | ICD-10-CM | POA: Insufficient documentation

## 2016-10-13 DIAGNOSIS — J90 Pleural effusion, not elsewhere classified: Secondary | ICD-10-CM

## 2016-10-13 DIAGNOSIS — I480 Paroxysmal atrial fibrillation: Secondary | ICD-10-CM | POA: Diagnosis not present

## 2016-10-13 DIAGNOSIS — I110 Hypertensive heart disease with heart failure: Secondary | ICD-10-CM | POA: Diagnosis not present

## 2016-10-13 DIAGNOSIS — I5022 Chronic systolic (congestive) heart failure: Secondary | ICD-10-CM | POA: Diagnosis not present

## 2016-10-13 DIAGNOSIS — Z9889 Other specified postprocedural states: Secondary | ICD-10-CM | POA: Diagnosis not present

## 2016-10-13 DIAGNOSIS — Z7982 Long term (current) use of aspirin: Secondary | ICD-10-CM | POA: Insufficient documentation

## 2016-10-13 DIAGNOSIS — I2510 Atherosclerotic heart disease of native coronary artery without angina pectoris: Secondary | ICD-10-CM | POA: Insufficient documentation

## 2016-10-13 DIAGNOSIS — E119 Type 2 diabetes mellitus without complications: Secondary | ICD-10-CM | POA: Diagnosis not present

## 2016-10-13 DIAGNOSIS — J449 Chronic obstructive pulmonary disease, unspecified: Secondary | ICD-10-CM | POA: Insufficient documentation

## 2016-10-13 DIAGNOSIS — I4891 Unspecified atrial fibrillation: Secondary | ICD-10-CM | POA: Insufficient documentation

## 2016-10-13 DIAGNOSIS — Z720 Tobacco use: Secondary | ICD-10-CM

## 2016-10-13 DIAGNOSIS — Z95811 Presence of heart assist device: Secondary | ICD-10-CM

## 2016-10-13 DIAGNOSIS — I5082 Biventricular heart failure: Secondary | ICD-10-CM | POA: Insufficient documentation

## 2016-10-13 DIAGNOSIS — Z7901 Long term (current) use of anticoagulants: Secondary | ICD-10-CM | POA: Diagnosis not present

## 2016-10-13 DIAGNOSIS — F1410 Cocaine abuse, uncomplicated: Secondary | ICD-10-CM | POA: Insufficient documentation

## 2016-10-13 DIAGNOSIS — Z794 Long term (current) use of insulin: Secondary | ICD-10-CM | POA: Diagnosis not present

## 2016-10-13 DIAGNOSIS — Z95810 Presence of automatic (implantable) cardiac defibrillator: Secondary | ICD-10-CM | POA: Insufficient documentation

## 2016-10-13 DIAGNOSIS — Z79899 Other long term (current) drug therapy: Secondary | ICD-10-CM | POA: Diagnosis not present

## 2016-10-13 LAB — RAPID URINE DRUG SCREEN, HOSP PERFORMED
AMPHETAMINES: NOT DETECTED
Barbiturates: NOT DETECTED
Benzodiazepines: NOT DETECTED
Cocaine: NOT DETECTED
Opiates: NOT DETECTED
Tetrahydrocannabinol: NOT DETECTED

## 2016-10-13 LAB — BRAIN NATRIURETIC PEPTIDE: B NATRIURETIC PEPTIDE 5: 321.5 pg/mL — AB (ref 0.0–100.0)

## 2016-10-13 LAB — BASIC METABOLIC PANEL
Anion gap: 7 (ref 5–15)
BUN: 25 mg/dL — AB (ref 6–20)
CALCIUM: 9.1 mg/dL (ref 8.9–10.3)
CO2: 32 mmol/L (ref 22–32)
CREATININE: 1.15 mg/dL (ref 0.61–1.24)
Chloride: 99 mmol/L — ABNORMAL LOW (ref 101–111)
Glucose, Bld: 159 mg/dL — ABNORMAL HIGH (ref 65–99)
Potassium: 4.1 mmol/L (ref 3.5–5.1)
SODIUM: 138 mmol/L (ref 135–145)

## 2016-10-13 LAB — CBC
HEMATOCRIT: 35.2 % — AB (ref 39.0–52.0)
HEMOGLOBIN: 10.9 g/dL — AB (ref 13.0–17.0)
MCH: 27.7 pg (ref 26.0–34.0)
MCHC: 31 g/dL (ref 30.0–36.0)
MCV: 89.6 fL (ref 78.0–100.0)
Platelets: 203 10*3/uL (ref 150–400)
RBC: 3.93 MIL/uL — AB (ref 4.22–5.81)
RDW: 16.1 % — ABNORMAL HIGH (ref 11.5–15.5)
WBC: 8.3 10*3/uL (ref 4.0–10.5)

## 2016-10-13 LAB — LACTATE DEHYDROGENASE: LDH: 204 U/L — ABNORMAL HIGH (ref 98–192)

## 2016-10-13 LAB — PROTIME-INR
INR: 2.54
PROTHROMBIN TIME: 27.9 s — AB (ref 11.4–15.2)

## 2016-10-13 LAB — PREALBUMIN: PREALBUMIN: 20.2 mg/dL (ref 18–38)

## 2016-10-13 MED ORDER — LIDOCAINE HCL 1 % IJ SOLN
INTRAMUSCULAR | Status: AC
  Filled 2016-10-13: qty 20

## 2016-10-13 MED ORDER — LIDOCAINE HCL (PF) 1 % IJ SOLN
INTRAMUSCULAR | Status: AC
  Filled 2016-10-13: qty 10

## 2016-10-13 NOTE — Procedures (Signed)
Ultrasound-guided therapeutic left thoracentesis performed yielding 0.8 liters of amber colored fluid. No immediate complications. Follow-up chest x-ray pending.  Procedure terminated due to pain and hypotension  Chrystle Murillo E 11:18 AM 10/13/2016

## 2016-10-13 NOTE — Progress Notes (Signed)
CSW met with patient in the clinic. Patient reports improvement although still feeling down. Patient spoke about possible return to driving soon and is looking forward to some independence. CSW offered support and will continue to follow through outpatient HF clinic. CSW will follow up with phone call to remind patient of LVAD Support Group coming up next week. Raquel Sarna, LCSW [phone]

## 2016-10-13 NOTE — Progress Notes (Signed)
Patient presents for hospital DC FU in [facility] today with Ronald Miller (father). Reports no problems with VAD equipment or concerns with drive line.  Vital Signs:  Doppler Pressure: 90 Automatc BP: 117/92 (98) HR: unable to obtain  SPO2: unable to obtain  Weight: 219.6  Last weight: 225.8 Home weights: 216 - 214 lbs Body mass index is 32.43 kg/m.   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   VAD interrogation & Equipment Management: Speed: 5900 Flow: 5.5 Power: 4.8 w    PI: 2.9  Alarms: no clinical alarms  Events: 18 today; 29 yesterday; 0 - 5 daily prior to yesterday  Fixed speed: 5900 Low speed limit: 5600  Primary Controller:  Replace back up battery in 29 months. Back up controller:   Replace back up battery in 29 months.  Annual Equipment Maintenance on UBC/PM was performed on Dec 2017. **MPU batteries due to be changed 03/2016   I reviewed the LVAD parameters from today and compared the results to the patient's prior recorded data and reviewed with Dr. Haroldine Laws. LVAD interrogation was NEGATIVE for significant power changes, NEGATIVE for clinical alarms. INCREASE in PI's over last two days. No programming changes were made and pump is functioning within specified parameters. Pt is performing daily controller and system monitor self tests along with completing weekly and monthly maintenance for LVAD equipment.  LVAD equipment check completed and is in good working order. Back-up equipment present.   Exit Site Care: Drive line is being maintained every other day by sister. Drive line exit site with some erythema, no drainage on Aquacel strip. Procimal suture removed per standard of care post-VAD. The velour is fully implanted at exit site. Tissue ingrowth progressing well. Stabilization device present and accurately applied; changed with dressing change  today. Pt denies fever or chills. Patient has enough supplies at home. Progressed to twice weekly as d/w Dr. Haroldine Laws. Cleansed with Saline only; CHG allergy. NO SKIN PREP.   Significant Events on VAD Support:  Increased PI events over last few days; pt denies any dizziness, pre-syncope, or syncope. Is drinking @ 1.5 L/day with understanding of 2 liter per day fluid restriction. Discussed with Darrick Grinder, NP - instructed pt to increase fluid intake with no fluid restrictions.   Device: Protecta Therapies: Off from surgery (patient requests for therapies to stay off) Last check: 09/09/16  BP & Labs:  MAP 90 - Doppler is interpreted as MAP  Hgb 10.9 - No S/S of bleeding. Specifically denies melena/BRBPR. Pt does report intermittent nosebleeds over last three days.  LDH stable at 204 with established baseline of 180 - 250. Denies tea-colored urine. No power elevations noted on interrogation.    Ronald Madeira, RN VAD Coordinator   Office: [phone]  24/7 Emergency VAD Pager: [phone]

## 2016-10-14 ENCOUNTER — Telehealth (HOSPITAL_COMMUNITY): Payer: Self-pay | Admitting: Surgery

## 2016-10-14 NOTE — Telephone Encounter (Signed)
Ronald Miller called to inquire about a scale.  He says that he sent his equipment back to [facility].  I have advised him via message left on his phone that we can provide a scale during his next appt. If he is unable to afford one.

## 2016-10-16 NOTE — Progress Notes (Signed)
Advanced Heart Failure Clinic Note   Referring Provider: Darylene Price, FNP Primary Care: [facility] Primary Cardiologist: Dr Haroldine Laws.   HPI:  Ronald Miller. is a 61 y.o. male with history of chronic systolic HF s/p Medtronic ICD 2014, CAD s/p CABG in 2000, HTN, Hx of cocaine abuse, Tobacco abuse, Depression, and COPD.   Underwent HM 3 placement 09/09/16. Post-op course complicated by RV failure, volume overload, left pleural effusion and PAF. He was diuresed successfully. Milrinone weaned to off. Underwent ramp echo prior to d/c and speed adjusted. Also underwent left thoracentesis with IR (600 cc off) with moderate improvement in size of effusion. Warfarin loaded. INR 2.0. Co-ox on date of d/c was 52% but he was ambulating well without symptoms. PICC line pulled prior to discharge.   He presents today for VAD follow up. His father Ronald Miller is present. Continues to feel better. Getting stronger.  More active. Pocket pain resolved ad has stopped gabapentin. MAP improved with addition of losartan at last visit. Underwent left thoracentesis today with 800 cc off. No orthopnea, PND or edema.  Denies BRBPR or melena. No fever or chills. Denies smoking or drugs.   Denies LVAD alarms.  Denies driveline trauma, erythema or drainage.  Denies ICD shocks.   Reports taking Coumadin as prescribed and adherence to anticoagulation based dietary restrictions.  Denies bright red blood per rectum or melena, no dark urine or hematuria.    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   VAD interrogation & Equipment Management: Speed: 5900 Flow: 5.5 Power: 4.8 w PI: 2.9  Alarms: no clinical alarms Events: 18 today; 29 yesterday; 0 - 5 daily prior to yesterday  Fixed speed: 5900 Low speed limit: 5600  Fixed speed: 5900 Low speed limit: 5600   Labs (12/17): K 4.4,  creatinine 0.92, LFTs normal, BNP 524, hgb 9.7, LDH 187, INR 1.75  Past Medical History:  Diagnosis Date  . AICD (automatic cardioverter/defibrillator) present   . ASCVD (arteriosclerotic cardiovascular disease)   . Chronic systolic CHF (congestive heart failure) ([place])   . COPD (chronic obstructive pulmonary disease) ([place])   . Coronary artery disease   . Depression   . Diabetes mellitus   . History of cocaine abuse   . Hypertension   . Presence of permanent cardiac pacemaker   . Shortness of breath dyspnea   . Suicidal ideation   . Tobacco abuse     Current Outpatient Prescriptions  Medication Sig Dispense Refill  . acetaminophen (TYLENOL) 500 MG tablet Take 1,000 mg by mouth every 6 (six) hours as needed for mild pain or headache.    . albuterol (PROVENTIL HFA;VENTOLIN HFA) 108 (90 BASE) MCG/ACT inhaler Inhale 2 puffs into the lungs every 6 (six) hours as needed for wheezing or shortness of breath.    Marland Kitchen amiodarone (PACERONE) 200 MG tablet Take 1 tablet (200 mg total) by mouth daily. 60 tablet 6  . aspirin EC 81 MG tablet Take 81 mg by mouth daily.     Marland Kitchen atorvastatin (LIPITOR) 20 MG tablet Take 20 mg by mouth at bedtime.     . baclofen (LIORESAL) 10 MG tablet Take 10 mg by mouth 3 (three) times daily as needed for muscle spasms.    Marland Kitchen dextromethorphan-guaiFENesin (MUCINEX DM) 30-600 MG 12hr tablet Take 1 tablet by mouth 2 (two) times daily. 60 tablet 0  . digoxin (LANOXIN) 0.125 MG tablet Take  1 tablet (0.125 mg total) by mouth daily. 30 tablet 6  . docusate sodium (COLACE) 100 MG capsule Take 2 capsules (200 mg total) by mouth daily. 30 capsule 6  . escitalopram (LEXAPRO) 20 MG tablet Take 1 tablet (20 mg total) by mouth daily. 30 tablet 0  . gabapentin (NEURONTIN) 100 MG capsule Take 2 capsules (200 mg total) by mouth 2 (two) times daily. 120 capsule 3  . insulin aspart (NOVOLOG FLEXPEN) 100 UNIT/ML FlexPen Inject 10 Units into the skin 3 (three) times daily with meals.    . Insulin  Detemir (LEVEMIR) 100 UNIT/ML Pen Inject 10 Units into the skin 2 (two) times daily at 8 am and 10 pm. 15 pen 11  . losartan (COZAAR) 25 MG tablet Take 1 tablet (25 mg total) by mouth daily. 30 tablet 0  . magnesium oxide (MAG-OX) 400 (241.3 Mg) MG tablet Take 1 tablet (400 mg total) by mouth daily. 30 tablet 0  . Multiple Vitamin (MULTIVITAMIN WITH MINERALS) TABS tablet Take 1 tablet by mouth daily. Sentry/Centrum    . Omega-3 Fatty Acids (FISH OIL) 1000 MG CAPS Take 1,000 mg by mouth daily with supper.     . pantoprazole (PROTONIX) 40 MG tablet Take 1 tablet (40 mg total) by mouth daily. 30 tablet 6  . potassium chloride 20 MEQ TBCR Take 20 mEq by mouth daily. 60 tablet 6  . senna (SENOKOT) 8.6 MG TABS tablet Take 2 tablets (17.2 mg total) by mouth at bedtime as needed. Contact PCP for further refills, this is a courtesy refill (Patient taking differently: Take 2 tablets by mouth at bedtime as needed (constipation). ) 180 each 0  . spironolactone (ALDACTONE) 25 MG tablet Take 1 tablet (25 mg total) by mouth daily. 90 tablet 1  . torsemide (DEMADEX) 20 MG tablet Take 2 tablets (40 mg total) by mouth daily.    . traMADol (ULTRAM) 50 MG tablet Take 1 tablet (50 mg total) by mouth every 12 (twelve) hours as needed for severe pain. 60 tablet 0  . warfarin (COUMADIN) 5 MG tablet Take 1.5 tablets (7.5 mg total) by mouth daily. And as prescribed on blood checks 60 tablet 11   No current facility-administered medications for this encounter.     Codeine and Lipitor [atorvastatin]  REVIEW OF SYSTEMS: All systems negative except as listed in HPI, PMH and Problem list.  Vitals:   10/13/16 1430  BP: (!) 117/92  Weight: 219 lb 9.6 oz (99.6 kg)  Height: 5\' 9"  (1.753 m)    Vital Signs:  Doppler Pressure: 90 Automatc BP: 117/92 (98) HR: unable to obtain  SPO2: unable to obtain  Weight: 219.6  Last weight: 225.8 Home weights: 216 - 214 lbs Body mass index is 32.43 kg/m.    Wt Readings from  Last 3 Encounters:  10/13/16 219 lb 9.6 oz (99.6 kg)  10/07/16 223 lb (101.2 kg)  10/01/16 225 lb 12.8 oz (102.4 kg)    Physical Exam: GENERAL: NAD HEENT: Normal NECK: Supple, JVP 6-7 cm.  2+ bilaterally, no bruits.  No lymphadenopathy or thyromegaly appreciated.   CARDIAC:  Mechanical heart sounds with LVAD hum present.  LUNGS:  Clear to auscultation bilaterally. Dull breath sounds 1/3 way up on left.   ABDOMEN:  Soft, round, nontender, positive bowel sounds x4.     LVAD exit site: well-healed and incorporated.  Dressing dry and intact.  No erythema or drainage.  Stabilization device present and accurately applied.  Driveline dressing is being changed daily  per sterile technique. EXTREMITIES:  Warm and dry, no cyanosis, clubbing, rash or edema  NEUROLOGIC:  Alert and oriented x 4.  Gait steady.  No aphasia.  No dysarthria.  Affect pleasant.     ASSESSMENT AND PLAN:  1. Chronic end-stage biventricular systolic HF: LVEF 0000000 due to ICM -> s/p Echo 08/24/16 LVEF 15%, RV mild dilated, moderately reduced. 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   - Continues to improved s/p VAD. NYHA I-II -VAD parameters stable except for increased PI events over past 2 days. Will increase fluid intake. If persist will decrease torsemide to 20 daily (currently on 40 daily) - Can consider stopping dig at net visit. - Warfarin with goal INR 2.0 - 2.5 + ASA 81 daily. INR 2.54  today. Pharmacy to dose.  - MAP improved with recent addition of losartan. Today it is 90. Can titrate as needed.  - Had begun work up for Transplant via [facility]. Will need to be free from Tobacco and cocaine x 6 months before work up can continue. Will continue routine screens. I discussed need for complete abstinence. 2. CAD: Denies CP. 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  3. DMII: Hgb A1C 8.3 on 08/26/2016 4. Tobacco abuse - Says he has not smoked since prior to admission for LVAD. Cotinine level minimally elevated.  UDS remain negative - Congratulated on his cessation and encouraged not to resume.  - Will continue routine cotinine screens and UDS as part of transplant w/u 5. Atrial fibrillation - NSR on amio.  - Amiodarone decreased to 200 mg daily recently. Consider stopping at next visit 6. Left pleural effusion -underwent repeat thoracentesis today with 800cc off. CXR still with small to moderate residual effusion. (viewed personally). Will continue to follow.   Glori Bickers MD 10/16/2016

## 2016-10-20 ENCOUNTER — Encounter (HOSPITAL_COMMUNITY): Payer: Medicare HMO

## 2016-10-22 ENCOUNTER — Other Ambulatory Visit (HOSPITAL_COMMUNITY): Payer: Medicare HMO

## 2016-10-26 ENCOUNTER — Encounter (HOSPITAL_COMMUNITY): Payer: Self-pay | Admitting: *Deleted

## 2016-10-26 ENCOUNTER — Ambulatory Visit (HOSPITAL_COMMUNITY)
Admission: RE | Admit: 2016-10-26 | Discharge: 2016-10-26 | Disposition: A | Discharge status: Home / Self Care | Payer: Medicare HMO | Source: Ambulatory Visit | Attending: Cardiology | Admitting: Cardiology

## 2016-10-26 ENCOUNTER — Ambulatory Visit (HOSPITAL_COMMUNITY): Payer: Self-pay | Admitting: Pharmacist

## 2016-10-26 ENCOUNTER — Other Ambulatory Visit (HOSPITAL_COMMUNITY): Payer: Self-pay | Admitting: Pharmacist

## 2016-10-26 DIAGNOSIS — Z95811 Presence of heart assist device: Secondary | ICD-10-CM | POA: Diagnosis not present

## 2016-10-26 DIAGNOSIS — I5022 Chronic systolic (congestive) heart failure: Secondary | ICD-10-CM

## 2016-10-26 DIAGNOSIS — Z5181 Encounter for therapeutic drug level monitoring: Secondary | ICD-10-CM | POA: Diagnosis not present

## 2016-10-26 DIAGNOSIS — G629 Polyneuropathy, unspecified: Secondary | ICD-10-CM

## 2016-10-26 LAB — CBC
HCT: 35.3 % — ABNORMAL LOW (ref 39.0–52.0)
Hemoglobin: 11 g/dL — ABNORMAL LOW (ref 13.0–17.0)
MCH: 27.5 pg (ref 26.0–34.0)
MCHC: 31.2 g/dL (ref 30.0–36.0)
MCV: 88.3 fL (ref 78.0–100.0)
Platelets: 163 10*3/uL (ref 150–400)
RBC: 4 MIL/uL — AB (ref 4.22–5.81)
RDW: 15.8 % — ABNORMAL HIGH (ref 11.5–15.5)
WBC: 9.4 10*3/uL (ref 4.0–10.5)

## 2016-10-26 LAB — PROTIME-INR
INR: 1.31
PROTHROMBIN TIME: 16.4 s — AB (ref 11.4–15.2)

## 2016-10-26 LAB — BASIC METABOLIC PANEL
ANION GAP: 7 (ref 5–15)
BUN: 17 mg/dL (ref 6–20)
CO2: 32 mmol/L (ref 22–32)
Calcium: 8.8 mg/dL — ABNORMAL LOW (ref 8.9–10.3)
Chloride: 97 mmol/L — ABNORMAL LOW (ref 101–111)
Creatinine, Ser: 0.94 mg/dL (ref 0.61–1.24)
Glucose, Bld: 209 mg/dL — ABNORMAL HIGH (ref 65–99)
Potassium: 3.8 mmol/L (ref 3.5–5.1)
SODIUM: 136 mmol/L (ref 135–145)

## 2016-10-26 LAB — LACTATE DEHYDROGENASE: LDH: 181 U/L (ref 98–192)

## 2016-10-26 MED ORDER — ENOXAPARIN SODIUM 100 MG/ML ~~LOC~~ SOLN: 100.0000 mg | Freq: Two times a day (BID) | SUBCUTANEOUS | 1 refills | Status: DC

## 2016-10-26 MED FILL — ENOXAPARIN 100 MG/ML SYRN: 100 | 5 days supply | Qty: 10 | Fill #0

## 2016-10-26 NOTE — Patient Instructions (Signed)
Continue twice weekly dressing changes WITHOUT silver gauze strip.

## 2016-10-26 NOTE — Progress Notes (Signed)
CSW met with patient in the clinic. Patient reports he is beginning to get out and socialize again. He reports he is feeling well although struggles with clothing to carry the VAD equipment. CSW discussed options of clothing shared by other VAD patients. CSW also encouraged patient to attend VAD support group for VAD fellowship and support system. Patient appears agreeable to attend and appears to be doing well and adjusting to life with a VAD. CSW will continue to follow for support and encouragement with VAD. Raquel Sarna, [place], [place]

## 2016-10-26 NOTE — Progress Notes (Addendum)
Patient presents for 1 week follow up  in [facility] today. Reports no problems with VAD equipment or concerns with drive line.  Vital Signs:  Doppler Pressure: 84 Automatc BP: 109/78 (87) HR:  86 SPO2: 95 %  Weight: 218.8 lb w/o eqt Last weight: 219.6 lb Home weights: 216 lbs There is no height or weight on file to calculate BMI.   VAD Indication: Destination Therapy  VAD interrogation & Equipment Management: Speed: 5900 Flow: 5.6 Power: 4.8 w    PI: 2.7  Alarms: no clinical alarms  Events:30 daily  Fixed speed: 5900 Low speed limit: 5600  Primary Controller:  Replace back up battery in 29 months. Back up controller:   Replace back up battery in 29 months.  Annual Equipment Maintenance on UBC/PM was performed on Dec 2017.   I reviewed the LVAD parameters from today and compared the results to the patient's prior recorded data. LVAD interrogation was NEGATIVE for significant power changes, NEGATIVE for clinical alarms and STABLE for PI events/speed drops. No programming changes were made and pump is functioning within specified parameters. Pt is performing daily controller and system monitor self tests along with completing weekly and monthly maintenance for LVAD equipment.  LVAD equipment check completed and is in good working order. Back-up equipment present. Charged back up battery and performed self-test on equipment.   Exit Site Care: Drive line is being maintained every other day by sister. Drive line exit site well healed and incorporated. The velour is fully implanted at exit site. Dressing dry and intact. No erythema or drainage. Stabilization device present and accurately applied. Pt denies fever or chills. Pt states they have adequate dressing supplies at home.    Discussed progress of the wound with Dr Nils Pyle. Instructed to discontinue use of silver gauze.   Significant Events on VAD Support:  none  Device: Protecta Therapies: off Last  check: 09/09/16  BP & Labs:  MAP 84 - Doppler is reflecting MAP  Hgb11 - No S/S of bleeding. Specifically denies melena/BRBPR or nosebleeds.  LDH stable at 181 with established baseline of 180-250. Denies tea-colored urine. No power elevations noted on interrogation.    Balinda Quails RN [place] Coordinator   Office: [phone] 24/7 Emergency VAD Pager: [phone]

## 2016-10-27 ENCOUNTER — Telehealth (HOSPITAL_COMMUNITY): Payer: Self-pay | Admitting: Pharmacist

## 2016-10-27 ENCOUNTER — Telehealth (HOSPITAL_COMMUNITY): Payer: Self-pay | Admitting: Infectious Diseases

## 2016-10-27 NOTE — Telephone Encounter (Signed)
Enoxaparin 100 mg BID PA approved by [employer] Part D through 11/26/16.   Ruta Hinds. Velva Harman, PharmD, BCPS, CPP Clinical Pharmacist Pager: [phone] Phone: [phone] 10/27/2016 2:30 PM

## 2016-10-27 NOTE — Telephone Encounter (Signed)
Called to report nosebleeds with lovenox. Currently it has stopped however last PM it was difficult to stop. Reports he took "2 warfarin last night (10 mg) despite being told to take 7.5 mg. He was confused as to the dose. Instructed him to write the instructions down regarding the shots and the warfarin. Advised to space shots out 12 hours apart to decrease risk of bleeding.   Also advised: Afrin nasal spray up to 2x daily with packing, 15-20 min minimum of firm constant pressure to nasal bridge, saline ointment to nose multiple times a day to keep moist and avoiding blowing nose. Discussed to call back should bleeding continue and will D/W Dr. Haroldine Laws about dose of lovenox. Also discussed indications of reporting to ED for cauterization / packing if necessary. Verbalized understanding of the above and verified he wrote instructions down.   [name], RN VAD Coordinator   Office: [phone] 24/7 Emergency VAD Pager: [phone]

## 2016-10-28 ENCOUNTER — Telehealth: Payer: Self-pay | Admitting: *Deleted

## 2016-10-28 ENCOUNTER — Ambulatory Visit (HOSPITAL_COMMUNITY)
Admission: RE | Admit: 2016-10-28 | Discharge: 2016-10-28 | Disposition: A | Discharge status: Home / Self Care | Payer: Medicare HMO | Source: Ambulatory Visit | Attending: Internal Medicine | Admitting: Internal Medicine

## 2016-10-28 VITALS — BP 157/78

## 2016-10-28 DIAGNOSIS — Z95811 Presence of heart assist device: Secondary | ICD-10-CM | POA: Diagnosis not present

## 2016-10-28 DIAGNOSIS — R040 Epistaxis: Secondary | ICD-10-CM | POA: Insufficient documentation

## 2016-10-28 DIAGNOSIS — I5022 Chronic systolic (congestive) heart failure: Secondary | ICD-10-CM

## 2016-10-28 DIAGNOSIS — Z792 Long term (current) use of antibiotics: Secondary | ICD-10-CM | POA: Diagnosis not present

## 2016-10-28 LAB — CBC
HCT: 37.1 % — ABNORMAL LOW (ref 39.0–52.0)
Hemoglobin: 11.6 g/dL — ABNORMAL LOW (ref 13.0–17.0)
MCH: 27.8 pg (ref 26.0–34.0)
MCHC: 31.3 g/dL (ref 30.0–36.0)
MCV: 89 fL (ref 78.0–100.0)
PLATELETS: 153 10*3/uL (ref 150–400)
RBC: 4.17 MIL/uL — AB (ref 4.22–5.81)
RDW: 16.2 % — ABNORMAL HIGH (ref 11.5–15.5)
WBC: 9.3 10*3/uL (ref 4.0–10.5)

## 2016-10-28 LAB — PROTIME-INR
INR: 1.52
PROTHROMBIN TIME: 18.5 s — AB (ref 11.4–15.2)

## 2016-10-28 MED ORDER — AMOXICILLIN-POT CLAVULANATE 875-125 MG PO TABS: 1.0000 | ORAL_TABLET | Freq: Two times a day (BID) | ORAL | 0 refills | Status: DC

## 2016-10-28 NOTE — Patient Instructions (Addendum)
STOP lovenox shots  Continue coumadin 1.5 pills everyday.   Start antibiotics ONE PILL TWICE A DAY while your nasal packing is in place.   E[facility] Nose and Throat (ENT) Doctor Address: [address] [place], [place], [place] [address]  Phone: [phone]

## 2016-10-28 NOTE — Telephone Encounter (Signed)
-----   Message from Jolaine Artist, MD sent at 10/27/2016  9:17 PM EST ----- Regarding: RE: Clearance to restart Cardiac Rehab Absolutely. He is doing well. Thanks!  ----- Message ----- From: Clotilde Dieter Sent: 10/26/2016   1:28 PM To: Jolaine Artist, MD Subject: Clearance to restart Cardiac Rehab             Dr. Haroldine Laws,  We have been following [name]'s progress since his LVAD.  Is he ready to return to rehab at this point?  Please let us know when to bring him back in.  Thanks for your help! Alberteen Sam, MA, ACSM RCEP 10/26/2016 1:30 PM

## 2016-10-28 NOTE — Progress Notes (Signed)
Patient showed up in clinic today with active nose bleeding. Reports he tried stopping with the tactics we discussed over the phone yesterday. "He did not want to call pager to bother [place]."   BP: 157/78 (105) Doppler - 110  (has not taken medications today)  Appt made to ENT for today at 13:00 per Dr. Haroldine Laws after attempted cauterization and packing with persistent bleeding. Instructions given for antibiotic management and warfarin management per orders. Will schedule lab visit next week with ENT follow up if reasonable time frame to reassess INR. Stopping Lovenox injections.   [name], RN VAD Coordinator   Office: [phone] 24/7 Emergency VAD Pager: [phone]

## 2016-10-28 NOTE — Progress Notes (Signed)
Advanced Heart Failure Clinic Note   Referring Provider: Darylene Price, FNP Primary Care: [facility] Primary Cardiologist: Dr Haroldine Laws.   HPI:  Ronald Miller. is a 61 y.o. male with history of chronic systolic HF s/p Medtronic ICD 2014, CAD s/p CABG in 2000, HTN, Hx of cocaine abuse, Tobacco abuse, Depression, and COPD.   Underwent HM 3 placement 09/09/16. Post-op course complicated by RV failure, volume overload, left pleural effusion and PAF. He was diuresed successfully. Milrinone weaned to off. Underwent ramp echo prior to d/c and speed adjusted. Also underwent left thoracentesis with IR (600 cc off) with moderate improvement in size of effusion. Warfarin loaded. INR 2.0. Co-ox on date of d/c was 52% but he was ambulating well without symptoms. PICC line pulled prior to discharge.   He presents today for add on for nose bleed. This started lightly yesterday afternoon. He contacted VAD coordinator and was given strategies to stop the bleeding including pressure, Afrin, and ice pack and told to call back if continued or worsened.   Pt states it has been bleeding on and off all night but he "didn't want to bother anyone late at night" so drove here this morning. Recently started on Lovenox for subtherapeutic INR.  CBC stable today.    No BRBPR or melena. MAP somewhat elevated in presence of anxiety from nose bleed.   No VAD alarms. No ICD shocks. No driveline trauma, erythema, or drainage.   10/28/16 CBC Hgb 11.6. INR 1.52.  VAD Indication: HM3 LVAD 09/09/2016 Destination Therapy- Evaluation completed at [facility] - unable to continue process d/t +nicotine / cocaine).   VAD interrogation & Equipment Management: Speed: 5900 Flow: 5.6 Power: 4.4 w PI: 2.7  Fixed speed: 5900 Low speed limit: 5600  Labs (12/17): K 4.4, creatinine 0.92, LFTs normal, BNP 524, hgb 9.7, LDH 187, INR 1.75  Past Medical History:  Diagnosis Date  . AICD (automatic  cardioverter/defibrillator) present   . ASCVD (arteriosclerotic cardiovascular disease)   . Chronic systolic CHF (congestive heart failure) ([place])   . COPD (chronic obstructive pulmonary disease) ([place])   . Coronary artery disease   . Depression   . Diabetes mellitus   . History of cocaine abuse   . Hypertension   . Presence of permanent cardiac pacemaker   . Shortness of breath dyspnea   . Suicidal ideation   . Tobacco abuse     Current Outpatient Prescriptions  Medication Sig Dispense Refill  . acetaminophen (TYLENOL) 500 MG tablet Take 1,000 mg by mouth every 6 (six) hours as needed for mild pain or headache.    . albuterol (PROVENTIL HFA;VENTOLIN HFA) 108 (90 BASE) MCG/ACT inhaler Inhale 2 puffs into the lungs every 6 (six) hours as needed for wheezing or shortness of breath.    Marland Kitchen amiodarone (PACERONE) 200 MG tablet Take 1 tablet (200 mg total) by mouth daily. 60 tablet 6  . amoxicillin-clavulanate (AUGMENTIN) 875-125 MG tablet Take 1 tablet by mouth 2 (two) times daily. Take while nasal packing is in place. STOP when packing removed. 10 tablet 0  . aspirin EC 81 MG tablet Take 81 mg by mouth daily.     Marland Kitchen atorvastatin (LIPITOR) 20 MG tablet Take 20 mg by mouth at bedtime.     . baclofen (LIORESAL) 10 MG tablet Take 10 mg by mouth 3 (three) times daily as needed for muscle spasms.    Marland Kitchen dextromethorphan-guaiFENesin (MUCINEX DM) 30-600 MG 12hr tablet Take 1 tablet by mouth 2 (  two) times daily. 60 tablet 0  . digoxin (LANOXIN) 0.125 MG tablet Take 1 tablet (0.125 mg total) by mouth daily. 30 tablet 6  . docusate sodium (COLACE) 100 MG capsule Take 2 capsules (200 mg total) by mouth daily. 30 capsule 6  . enoxaparin (LOVENOX) 100 MG/ML injection Inject 1 mL (100 mg total) into the skin every 12 (twelve) hours. 10 Syringe 1  . escitalopram (LEXAPRO) 20 MG tablet Take 1 tablet (20 mg total) by mouth daily. 30 tablet 0  . gabapentin (NEURONTIN) 100 MG capsule Take 2 capsules (200 mg total) by  mouth 2 (two) times daily. 120 capsule 3  . insulin aspart (NOVOLOG FLEXPEN) 100 UNIT/ML FlexPen Inject 10 Units into the skin 3 (three) times daily with meals.    . Insulin Detemir (LEVEMIR) 100 UNIT/ML Pen Inject 10 Units into the skin 2 (two) times daily at 8 am and 10 pm. 15 pen 11  . losartan (COZAAR) 25 MG tablet Take 1 tablet (25 mg total) by mouth daily. 30 tablet 0  . magnesium oxide (MAG-OX) 400 (241.3 Mg) MG tablet Take 1 tablet (400 mg total) by mouth daily. 30 tablet 0  . Multiple Vitamin (MULTIVITAMIN WITH MINERALS) TABS tablet Take 1 tablet by mouth daily. Sentry/Centrum    . Omega-3 Fatty Acids (FISH OIL) 1000 MG CAPS Take 1,000 mg by mouth daily with supper.     . pantoprazole (PROTONIX) 40 MG tablet Take 1 tablet (40 mg total) by mouth daily. 30 tablet 6  . potassium chloride 20 MEQ TBCR Take 20 mEq by mouth daily. 60 tablet 6  . senna (SENOKOT) 8.6 MG TABS tablet Take 2 tablets (17.2 mg total) by mouth at bedtime as needed. Contact PCP for further refills, this is a courtesy refill (Patient taking differently: Take 2 tablets by mouth at bedtime as needed (constipation). ) 180 each 0  . spironolactone (ALDACTONE) 25 MG tablet Take 1 tablet (25 mg total) by mouth daily. 90 tablet 1  . torsemide (DEMADEX) 20 MG tablet Take 2 tablets (40 mg total) by mouth daily.    . traMADol (ULTRAM) 50 MG tablet Take 1 tablet (50 mg total) by mouth every 12 (twelve) hours as needed for severe pain. 60 tablet 0  . warfarin (COUMADIN) 5 MG tablet Take 1.5 tablets (7.5 mg total) by mouth daily. And as prescribed on blood checks 60 tablet 11   No current facility-administered medications for this encounter.     Codeine and Lipitor [atorvastatin]  REVIEW OF SYSTEMS: All systems negative except as listed in HPI, PMH and Problem list.  Vitals:   10/28/16 1115  BP: (!) 157/78   MAP 105  Vital Signs:  Doppler Pressure: 90 Automatc BP: 117/92 (98) HR: unable to obtain  SPO2: unable to  obtain  Wt Readings from Last 3 Encounters:  10/13/16 219 lb 9.6 oz (99.6 kg)  10/07/16 223 lb (101.2 kg)  10/01/16 225 lb 12.8 oz (102.4 kg)     Physical Exam: GENERAL: Fatigued appearing. Slightly pale.  HEENT: R nostril with moderate clot burden, actively bleeding NECK: JVP 6-7 cm. 2+ bilaterally, no bruits.  No thyromegaly or nodule noted.  CARDIAC:  Mechanical heart sounds with LVAD hum present.  LUNGS:  Clear except for Dull breath sounds 1/3 way up on left.   ABDOMEN:  Soft, round, NT, ND, no HSM. No bruits or masses. +BS  LVAD exit site: well-healed and incorporated.  Dressing dry and intact.  No erythema or drainage.  Stabilization device present and accurately applied.  Driveline dressing is being changed daily per sterile technique. EXTREMITIES:  Warm and dry, no cyanosis, clubbing, rash or edema  NEUROLOGIC:  Alert and oriented x 4.  Gait steady.  No aphasia.  No dysarthria.  Affect pleasant.     ASSESSMENT AND PLAN:  1. Chronic end-stage biventricular systolic HF: LVEF 0000000 due to ICM -> s/p Echo 08/24/16 LVEF 15%, RV mild dilated, moderately reduced. 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   - Continues to improved s/p VAD. NYHA I-II - Hold torsemide for at least 2 days, then resume torsemide 40 mg daily.  - Can consider stopping dig at next full office visit.  - Warfarin with goal INR 2.0 - 2.5 + ASA 81 daily. INR 1.52 today.  He started on lovenox. Will stop and plan on coumadin alone for now with nose bleeds.   - MAP elevated today. No med adjustments with active bleeding.   - Had begun work up for Transplant via [facility]. Will need to be free from Tobacco and cocaine x 6 months before work up can continue. Will continue routine screens. Have discussed need for complete abstinence. 2. CAD: Denies CP. 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  3. DMII: Hgb A1C 8.3 on 08/26/2016 4. Tobacco abuse - Says he has not smoked since prior to admission for LVAD.  Cotinine level minimally elevated. UDS remain negative - Congratulated on his cessation and encouraged not to resume.  - Will continue routine cotinine screens and UDS as part of transplant w/u.  - No change to current plan.  5. Atrial fibrillation - NSR on exam.  - Amiodarone decreased to 200 mg daily recently. 6. Left pleural effusion -underwent repeat thoracentesis 1/10/1/8 with 800cc off. CXR still with small to moderate residual effusion same day.  - Continue to follow. CXR at next visit   7. Epistaxis - s/p Lovenox administration.  Will hold for now. Continue coumadin 7.5 mg daily for now.  - Cauterized with silver nitrate sticks and then packed with rhino rocket 7.5.  - Have scheduled him urgent ENT follow up this afternoon at 1300 - Will start Augmentin 875/125 mg x 5 days or while nasal packing in place.  Currently scheduled for 11/10/16, may need to move up depending on results of ENT visit. Will at least need blood work next week.   [name] Friar, PA-C  10/28/2016   Patient seen and examined with Oda Kilts, PA-C. We discussed all aspects of the encounter. I agree with the assessment and plan as stated above.   He is here for acute visit in the setting of severe epistaxis in the setting of recent lovenox injection for subtherapeutic INR (1.3). I visualized the area of bleeding in R nare. Initially attempted to cauterize it with silver nitrate. This stopped the bleeding temporarily but he then rebled. I then placed a nasal packing rocket in the right nare and inflated it as much as I could. He then began bleeding through the left nostril. I did not visualize any spots for cauterization in left nostril. We referred him for acute work-in referral to ENT. INR 1.5 today. Continue warfarin. Avoid lovenox.   Avel Ogawa,MD 8:24 PM

## 2016-10-29 ENCOUNTER — Telehealth (HOSPITAL_COMMUNITY): Payer: Self-pay | Admitting: *Deleted

## 2016-10-29 ENCOUNTER — Other Ambulatory Visit (HOSPITAL_COMMUNITY): Payer: Medicare HMO

## 2016-10-29 DIAGNOSIS — I5022 Chronic systolic (congestive) heart failure: Secondary | ICD-10-CM

## 2016-10-29 NOTE — Telephone Encounter (Signed)
Patient called in to see when he needed to come back for lab work. Ronald Miller advises for INR to be checked again on Monday.    Appointment made, labs ordered, and patient aware.

## 2016-10-31 ENCOUNTER — Telehealth: Payer: Self-pay | Admitting: Physician Assistant

## 2016-10-31 NOTE — Telephone Encounter (Signed)
Paged by patient today regarding recurrent nosebleed on Coumadin. He says he has been seen by ENT doctor twice now and continue to have nosebleed issue. He has tried Afrin in the past. He has a HeartMate 3 LVAD placed. He is taking Coumadin at home. Unfortunately, he will need to continued to need Coumadin at this point. I have advised him to follow-up with his ENT doctor regarding further treatment of nosebleed.  Ronald Corrigan PA Pager: [phone]

## 2016-11-01 ENCOUNTER — Inpatient Hospital Stay (HOSPITAL_COMMUNITY): Admission: RE | Admit: 2016-11-01 | Payer: Medicare HMO | Source: Ambulatory Visit

## 2016-11-02 ENCOUNTER — Ambulatory Visit (HOSPITAL_COMMUNITY)
Admission: RE | Admit: 2016-11-02 | Discharge: 2016-11-02 | Disposition: A | Discharge status: Home / Self Care | Payer: Medicare HMO | Source: Ambulatory Visit | Attending: Cardiology | Admitting: Cardiology

## 2016-11-02 ENCOUNTER — Ambulatory Visit (HOSPITAL_COMMUNITY): Payer: Self-pay | Admitting: Pharmacist

## 2016-11-02 DIAGNOSIS — Z95811 Presence of heart assist device: Secondary | ICD-10-CM

## 2016-11-02 DIAGNOSIS — I5022 Chronic systolic (congestive) heart failure: Secondary | ICD-10-CM | POA: Diagnosis not present

## 2016-11-02 LAB — PROTIME-INR
INR: 1.67
Prothrombin Time: 19.9 seconds — ABNORMAL HIGH (ref 11.4–15.2)

## 2016-11-03 ENCOUNTER — Telehealth: Payer: Self-pay | Admitting: Licensed Clinical Social Worker

## 2016-11-03 NOTE — Telephone Encounter (Signed)
CSW contacted patient by phone to follow up on issue raised during pre VAD assessment regarding 3 prong outlet. Patient had stated that he did not have a 3 prong outlet in his bedroom although initially stayed with his sister post discharge and would explore needed outlet down the road when he returned to his apartment. Patient still residing with his sister and states he does not plan to return to his previous apartment and will remain with his sister for the time being. He hopes to eventually obtain his own apartment. CSW offered support and will continue to follow through the VAD clinic. Raquel Sarna, [place], [place]

## 2016-11-07 NOTE — Progress Notes (Signed)
Advanced Heart Failure Clinic Note   Referring Provider: Darylene Price, FNP Primary Care: [facility] Primary Cardiologist: Dr Haroldine Laws.   HPI:  Ronald Stifel. is a 61 y.o. male with history of chronic systolic HF s/p Medtronic ICD 2014, CAD s/p CABG in 2000, HTN, Hx of cocaine abuse, Tobacco abuse, Depression, and COPD.   Underwent HM 3 placement 09/09/16. Post-op course complicated by RV failure, volume overload, left pleural effusion and PAF. He was diuresed successfully. Milrinone weaned to off. Underwent ramp echo prior to d/c and speed adjusted. Also underwent left thoracentesis with IR (600 cc off) with moderate improvement in size of effusion. Warfarin loaded. INR 2.0. Co-ox on date of d/c was 52% but he was ambulating well without symptoms. PICC line pulled prior to discharge.   In 10/13/16 had repeat thoracentesis.   He presents today for VAD follow up. His mother is present. Continues to feel better. Getting stronger.  More active. Beginning to socialize more.Volume status under good control. No orthopnea, PND or edema.  No problems with pump or driveline site. Denies BRBPR or melena. No fever or chills. Denies smoking or drugs.   Denies LVAD alarms.  Denies driveline trauma, erythema or drainage.  Denies ICD shocks.   Reports taking Coumadin as prescribed and adherence to anticoagulation based dietary restrictions.  Denies bright red blood per rectum or melena, no dark urine or hematuria.     VAD Indication: Destination Therapy  VAD interrogation & Equipment Management: Speed: 5900 Flow: 5.6 Power: 4.8 w PI: 2.7  Alarms: no clinical alarms Events:30 daily  Fixed speed: 5900 Low speed limit: 5600  Primary Controller: Replace back up battery in 38months. Back up controller: Replace back up battery in 11months.    Labs (12/17): K 4.4, creatinine 0.92, LFTs normal, BNP 524, hgb 9.7, LDH 187, INR 1.75  Past Medical History:    Diagnosis Date  . AICD (automatic cardioverter/defibrillator) present   . ASCVD (arteriosclerotic cardiovascular disease)   . Chronic systolic CHF (congestive heart failure) ([place])   . COPD (chronic obstructive pulmonary disease) ([place])   . Coronary artery disease   . Depression   . Diabetes mellitus   . History of cocaine abuse   . Hypertension   . Presence of permanent cardiac pacemaker   . Shortness of breath dyspnea   . Suicidal ideation   . Tobacco abuse     Current Outpatient Prescriptions  Medication Sig Dispense Refill  . acetaminophen (TYLENOL) 500 MG tablet Take 1,000 mg by mouth every 6 (six) hours as needed for mild pain or headache.    . albuterol (PROVENTIL HFA;VENTOLIN HFA) 108 (90 BASE) MCG/ACT inhaler Inhale 2 puffs into the lungs every 6 (six) hours as needed for wheezing or shortness of breath.    Marland Kitchen amiodarone (PACERONE) 200 MG tablet Take 1 tablet (200 mg total) by mouth daily. 60 tablet 6  . aspirin EC 81 MG tablet Take 81 mg by mouth daily.     Marland Kitchen atorvastatin (LIPITOR) 20 MG tablet Take 20 mg by mouth at bedtime.     . baclofen (LIORESAL) 10 MG tablet Take 10 mg by mouth 3 (three) times daily as needed for muscle spasms.    Marland Kitchen dextromethorphan-guaiFENesin (MUCINEX DM) 30-600 MG 12hr tablet Take 1 tablet by mouth 2 (two) times daily. 60 tablet 0  . digoxin (LANOXIN) 0.125 MG tablet Take 1 tablet (0.125 mg total) by mouth daily. 30 tablet 6  . docusate sodium (COLACE) 100 MG  capsule Take 2 capsules (200 mg total) by mouth daily. 30 capsule 6  . escitalopram (LEXAPRO) 20 MG tablet Take 1 tablet (20 mg total) by mouth daily. 30 tablet 0  . gabapentin (NEURONTIN) 100 MG capsule Take 2 capsules (200 mg total) by mouth 2 (two) times daily. 120 capsule 3  . insulin aspart (NOVOLOG FLEXPEN) 100 UNIT/ML FlexPen Inject 10 Units into the skin 3 (three) times daily with meals.    . Insulin Detemir (LEVEMIR) 100 UNIT/ML Pen Inject 10 Units into the skin 2 (two) times daily at 8  am and 10 pm. 15 pen 11  . losartan (COZAAR) 25 MG tablet Take 1 tablet (25 mg total) by mouth daily. 30 tablet 0  . magnesium oxide (MAG-OX) 400 (241.3 Mg) MG tablet Take 1 tablet (400 mg total) by mouth daily. 30 tablet 0  . Multiple Vitamin (MULTIVITAMIN WITH MINERALS) TABS tablet Take 1 tablet by mouth daily. Sentry/Centrum    . Omega-3 Fatty Acids (FISH OIL) 1000 MG CAPS Take 1,000 mg by mouth daily with supper.     . pantoprazole (PROTONIX) 40 MG tablet Take 1 tablet (40 mg total) by mouth daily. 30 tablet 6  . potassium chloride 20 MEQ TBCR Take 20 mEq by mouth daily. 60 tablet 6  . senna (SENOKOT) 8.6 MG TABS tablet Take 2 tablets (17.2 mg total) by mouth at bedtime as needed. Contact PCP for further refills, this is a courtesy refill (Patient taking differently: Take 2 tablets by mouth at bedtime as needed (constipation). ) 180 each 0  . spironolactone (ALDACTONE) 25 MG tablet Take 1 tablet (25 mg total) by mouth daily. 90 tablet 1  . torsemide (DEMADEX) 20 MG tablet Take 2 tablets (40 mg total) by mouth daily.    . traMADol (ULTRAM) 50 MG tablet Take 1 tablet (50 mg total) by mouth every 12 (twelve) hours as needed for severe pain. 60 tablet 0  . warfarin (COUMADIN) 5 MG tablet Take 1.5 tablets (7.5 mg total) by mouth daily. And as prescribed on blood checks 60 tablet 11  . amoxicillin-clavulanate (AUGMENTIN) 875-125 MG tablet Take 1 tablet by mouth 2 (two) times daily. Take while nasal packing is in place. STOP when packing removed. 10 tablet 0  . enoxaparin (LOVENOX) 100 MG/ML injection Inject 1 mL (100 mg total) into the skin every 12 (twelve) hours. 10 Syringe 1   No current facility-administered medications for this encounter.     Codeine and Lipitor [atorvastatin]  REVIEW OF SYSTEMS: All systems negative except as listed in HPI, PMH and Problem list.  VVital Signs:  Doppler Pressure:84 Automatc BP: 109/78 (87) HR: 86 SPO2: 95 %  Weight: 218.8 lb w/o eqt Last weight:  219.6 lb Home weights: 216 lb   Wt Readings from Last 3 Encounters:  10/13/16 219 lb 9.6 oz (99.6 kg)  10/07/16 223 lb (101.2 kg)  10/01/16 225 lb 12.8 oz (102.4 kg)    Physical Exam: GENERAL: NAD. Ambulates without difficulty HEENT: Normal NECK: Supple, JVP 6 cm.  2+ bilaterally, no bruits.  No lymphadenopathy or thyromegaly appreciated.   CARDIAC:  Mechanical heart sounds with LVAD hum present.  LUNGS:  Clear to auscultation bilaterally. Dull breath sounds 1/3 way up on left.   ABDOMEN:  Soft, round, nontender, positive bowel sounds x4.     LVAD exit site: well-healed and incorporated.  Dressing dry and intact.  No erythema or drainage.  Stabilization device present and accurately applied.  Driveline dressing is being changed  daily per sterile technique. EXTREMITIES:  Warm and dry, no cyanosis, clubbing, rash or edema  NEUROLOGIC:  Alert and oriented x 4.  Gait steady.  No aphasia.  No dysarthria.  Affect pleasant.     ASSESSMENT AND PLAN:  1. Chronic end-stage biventricular systolic HF: LVEF 0000000 due to ICM -> s/p Echo 08/24/16 LVEF 15%, RV mild dilated, moderately reduced. 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   - Continues to improved s/p VAD. NYHA I-II - Volume status looks great. Consider decreasing torsemide to 20 daily at next visit. - VAD parameters stable - Will stop dig at next visit. - Warfarin with goal INR 2.0 - 2.5 + ASA 81 daily. INR 1.3  Today. Will d/w Pharmacy. Will need lovenox - MAP improved with recent addition of losartan. - Had begun work up for Transplant via [facility]. Will need to be free from Tobacco and cocaine x 6 months before work up can continue. Will continue routine screens. I discussed need for complete abstinence. Seems to be doing well with this.  2. CAD: Denies CP. 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  3. DMII: Hgb A1C 8.3 on 08/26/2016 4. Tobacco abuse - Says he has not smoked since prior to admission for LVAD. Cotinine  level minimally elevated. UDS remain negative - Congratulated on his cessation and encouraged not to resume.  - Will continue routine cotinine screens and UDS as part of transplant w/u 5. Atrial fibrillation - NSR on amio.  - Amiodarone decreased to 200 mg daily recently. Will continue for now 6. Left pleural effusion -underwent repeat thoracentesis recently. Recheck CXR next visit.    Glori Bickers MD 11/07/2016

## 2016-11-10 ENCOUNTER — Telehealth: Payer: Self-pay | Admitting: *Deleted

## 2016-11-10 ENCOUNTER — Ambulatory Visit (HOSPITAL_COMMUNITY)
Admission: RE | Admit: 2016-11-10 | Discharge: 2016-11-10 | Disposition: A | Discharge status: Home / Self Care | Payer: Medicare HMO | Source: Ambulatory Visit | Attending: Internal Medicine | Admitting: Internal Medicine

## 2016-11-10 ENCOUNTER — Ambulatory Visit (HOSPITAL_COMMUNITY): Payer: Self-pay | Admitting: Pharmacist

## 2016-11-10 DIAGNOSIS — Z5181 Encounter for therapeutic drug level monitoring: Secondary | ICD-10-CM | POA: Diagnosis present

## 2016-11-10 DIAGNOSIS — I5040 Unspecified combined systolic (congestive) and diastolic (congestive) heart failure: Secondary | ICD-10-CM | POA: Insufficient documentation

## 2016-11-10 DIAGNOSIS — J90 Pleural effusion, not elsewhere classified: Secondary | ICD-10-CM

## 2016-11-10 DIAGNOSIS — Z95811 Presence of heart assist device: Secondary | ICD-10-CM

## 2016-11-10 LAB — BASIC METABOLIC PANEL
ANION GAP: 10 (ref 5–15)
BUN: 12 mg/dL (ref 6–20)
CHLORIDE: 95 mmol/L — AB (ref 101–111)
CO2: 33 mmol/L — ABNORMAL HIGH (ref 22–32)
Calcium: 8.7 mg/dL — ABNORMAL LOW (ref 8.9–10.3)
Creatinine, Ser: 0.9 mg/dL (ref 0.61–1.24)
GFR calc Af Amer: >60 mL/min (ref >60)
GLUCOSE: 184 mg/dL — AB (ref 65–99)
POTASSIUM: 3.5 mmol/L (ref 3.5–5.1)
Sodium: 138 mmol/L (ref 135–145)

## 2016-11-10 LAB — CBC
HEMATOCRIT: 32.5 % — AB (ref 39.0–52.0)
HEMOGLOBIN: 9.8 g/dL — AB (ref 13.0–17.0)
MCH: 26.4 pg (ref 26.0–34.0)
MCHC: 30.2 g/dL (ref 30.0–36.0)
MCV: 87.6 fL (ref 78.0–100.0)
PLATELETS: 190 10*3/uL (ref 150–400)
RBC: 3.71 MIL/uL — AB (ref 4.22–5.81)
RDW: 15.6 % — ABNORMAL HIGH (ref 11.5–15.5)
WBC: 6.5 10*3/uL (ref 4.0–10.5)

## 2016-11-10 LAB — LACTATE DEHYDROGENASE: LDH: 171 U/L (ref 98–192)

## 2016-11-10 LAB — PROTIME-INR
INR: 1.98
Prothrombin Time: 22.8 seconds — ABNORMAL HIGH (ref 11.4–15.2)

## 2016-11-10 MED ORDER — TORSEMIDE 20 MG PO TABS: 20.0000 mg | ORAL_TABLET | Freq: Every day | ORAL | Status: DC

## 2016-11-10 NOTE — Telephone Encounter (Signed)
Called to schedule rehab.  LM on VM.

## 2016-11-10 NOTE — Patient Instructions (Signed)
Stop taking digoxin (Lanoxin)  Start taking Tosemide (demadex) one tablet daily. Take an extra tablet as needed for fluid or weight gain.  Return in one week for labs and follow up chest xray.  Start changing your driveline dressing weekly as instructed in clinic. Please call with questions.  We will make a referral for cardiac rehab.

## 2016-11-10 NOTE — Progress Notes (Signed)
Patient presents for 2 week  follow up in [facility] today. Reports no problems with VAD equipment or concerns with drive line. State he feels tired and "could sleep all the time".   Vital Signs:  Doppler Pressure 110   Automatc BP: 119/83 (105) HR:75   SPO2:100  %  Weight: 220.6 lb w/o eqt Last weight: 218.8 lb Home weights: 216 lbs   VAD Indication: Destination therapy    VAD interrogation & Equipment Management: Speed:5900 Flow: 5.5 Power:4.7 w    PI:3.1  Alarms: no clinical alarms Events: 30-40 PI events daily  Fixed speed 5900 Low speed limit: 5600  Primary Controller:  Replace back up battery in 1months. Back up controller:   Replace back up battery in 28 months.  Annual Equipment Maintenance on UBC/PM was performed on December 2017.   I reviewed the LVAD parameters from today and compared the results to the patient's prior recorded data. LVAD interrogation was NEGATIVE for significant power changes, NEGATIVE for clinical alarms and STABLE for PI events/speed drops. No programming changes were made and pump is functioning within specified parameters. Pt is performing daily controller and system monitor self tests along with completing weekly and monthly maintenance for LVAD equipment.  LVAD equipment check completed and is in good working order. Back-up equipment present. Charged back up battery and performed self-test on equipment.   Exit Site Care: Drive line is being maintained twice weekly  by his mother and sister. Drive line exit site well healed and incorporated. The velour is fully implanted at exit site. Dressing dry and intact. No erythema or drainage. There is a yellow scab present. Stabilization device present and accurately applied. Pt denies fever or chills. Pt states they have adequate dressing supplies at home.  VAD Coordinator taught patient weekly dressing change kits. Verbalized understanding.    Significant Events on VAD Support:   none  Device:Protecta Therapies: off Last check: 09/09/2016   BP & Labs:  MAP110 - Doppler is reflecting MAP  Hgb 9.8 - No S/S of bleeding. Specifically denies melena/BRBPR or nosebleeds.  LDH stable at 171 with established baseline of 180- 250. Denies tea-colored urine. No power elevations noted on interrogation.   Will make referral for cardiac rehab and schedule follow up chest xray next week.    Balinda Quails RN [place] Coordinator   Office: [phone] 24/7 Emergency VAD Pager: [phone]

## 2016-11-13 NOTE — Progress Notes (Signed)
Advanced Heart Failure Clinic Note   Referring Provider: Darylene Price, FNP Primary Care: [facility] Primary Cardiologist: Dr Haroldine Laws.   HPI:  Ronald Miller. is a 61 y.o. male with history of chronic systolic HF s/p Medtr[employer] ICD 2014, CAD s/p CABG in 2000, HTN, Hx of cocaine abuse, Tobacco abuse, Depression, and COPD.   Underwent HM 3 placement 09/09/16. Post-op course complicated by RV failure, volume overload, left pleural effusion and PAF. He was diuresed successfully. Milrinone weaned to off. Underwent ramp echo prior to d/c and speed adjusted. Also underwent left thoracentesis with IR (600 cc off) with moderate improvement in size of effusion. Warfarin loaded. INR 2.0. Co-ox on date of d/c was 52% but he was ambulating well without symptoms. PICC line pulled prior to discharge.   In 10/13/16 had repeat thoracentesis.   He presents today for VAD follow up. At last visit had severe epistaxis after receiving lovenox for subtherapeutic INR. Had to go to ENT for packing. Bleeding now stopped. Feels ok but is just fatigued. Says he wants to sleep a lot. [name] is doing well. Able to do all ADLs without any problem. Volume status under good control. No orthopnea, PND or edema.  No problems with pump or driveline site. Denies BRBPR or melena. No fever or chills. Denies smoking or drugs.   Denies LVAD alarms.  Denies driveline trauma, erythema or drainage.  Denies ICD shocks.   Reports taking Coumadin as prescribed and adherence to anticoagulation based dietary restrictions.  Denies bright red blood per rectum or melena, no dark urine or hematuria.      VAD Indication: Destination therapy    VAD interrogation & Equipment Management: Speed:5900 Flow: 5.5 Power:4.7 w PI:3.1  Alarms: no clinical alarms Events: 30-40 PI events daily  Fixed speed 5900 Low speed limit: 5600     Labs (12/17): K 4.4, creatinine 0.92, LFTs normal, BNP 524, hgb 9.7, LDH  187, INR 1.75  Past Medical History:  Diagnosis Date  . AICD (automatic cardioverter/defibrillator) present   . ASCVD (arteriosclerotic cardiovascular disease)   . Chronic systolic CHF (congestive heart failure) ([place])   . COPD (chronic obstructive pulmonary disease) ([place])   . Coronary artery disease   . Depression   . Diabetes mellitus   . History of cocaine abuse   . Hypertension   . Presence of permanent cardiac pacemaker   . Shortness of breath dyspnea   . Suicidal ideation   . Tobacco abuse     Current Outpatient Prescriptions  Medication Sig Dispense Refill  . acetaminophen (TYLENOL) 500 MG tablet Take 1,000 mg by mouth every 6 (six) hours as needed for mild pain or headache.    . albuterol (PROVENTIL HFA;VENTOLIN HFA) 108 (90 BASE) MCG/ACT inhaler Inhale 2 puffs into the lungs every 6 (six) hours as needed for wheezing or shortness of breath.    Marland Kitchen amiodarone (PACERONE) 200 MG tablet Take 1 tablet (200 mg total) by mouth daily. 60 tablet 6  . aspirin EC 81 MG tablet Take 81 mg by mouth daily.     Marland Kitchen atorvastatin (LIPITOR) 20 MG tablet Take 20 mg by mouth at bedtime.     . baclofen (LIORESAL) 10 MG tablet Take 10 mg by mouth 3 (three) times daily as needed for muscle spasms.    Marland Kitchen dextromethorphan-guaiFENesin (MUCINEX DM) 30-600 MG 12hr tablet Take 1 tablet by mouth 2 (two) times daily. 60 tablet 0  . docusate sodium (COLACE) 100 MG capsule Take 2  capsules (200 mg total) by mouth daily. 30 capsule 6  . escitalopram (LEXAPRO) 20 MG tablet Take 1 tablet (20 mg total) by mouth daily. 30 tablet 0  . gabapentin (NEURONTIN) 100 MG capsule Take 2 capsules (200 mg total) by mouth 2 (two) times daily. 120 capsule 3  . insulin aspart (NOVOLOG FLEXPEN) 100 UNIT/ML FlexPen Inject 10 Units into the skin 3 (three) times daily with meals.    . Insulin Detemir (LEVEMIR) 100 UNIT/ML Pen Inject 10 Units into the skin 2 (two) times daily at 8 am and 10 pm. 15 pen 11  . losartan (COZAAR) 25 MG tablet  Take 1 tablet (25 mg total) by mouth daily. 30 tablet 0  . magnesium oxide (MAG-OX) 400 (241.3 Mg) MG tablet Take 1 tablet (400 mg total) by mouth daily. 30 tablet 0  . Multiple Vitamin (MULTIVITAMIN WITH MINERALS) TABS tablet Take 1 tablet by mouth daily. Sentry/Centrum    . Omega-3 Fatty Acids (FISH OIL) 1000 MG CAPS Take 1,000 mg by mouth daily with supper.     . pantoprazole (PROTONIX) 40 MG tablet Take 1 tablet (40 mg total) by mouth daily. 30 tablet 6  . potassium chloride 20 MEQ TBCR Take 20 mEq by mouth daily. 60 tablet 6  . senna (SENOKOT) 8.6 MG TABS tablet Take 2 tablets (17.2 mg total) by mouth at bedtime as needed. Contact PCP for further refills, this is a courtesy refill (Patient taking differently: Take 2 tablets by mouth at bedtime as needed (constipation). ) 180 each 0  . spironolactone (ALDACTONE) 25 MG tablet Take 1 tablet (25 mg total) by mouth daily. 90 tablet 1  . torsemide (DEMADEX) 20 MG tablet Take 1 tablet (20 mg total) by mouth daily. Take additional tablet as needed for fluid or weight gain    . traMADol (ULTRAM) 50 MG tablet Take 1 tablet (50 mg total) by mouth every 12 (twelve) hours as needed for severe pain. 60 tablet 0  . warfarin (COUMADIN) 5 MG tablet Take 1.5 tablets (7.5 mg total) by mouth daily. And as prescribed on blood checks 60 tablet 11  . amoxicillin-clavulanate (AUGMENTIN) 875-125 MG tablet Take 1 tablet by mouth 2 (two) times daily. Take while nasal packing is in place. STOP when packing removed. (Patient not taking: Reported on 11/10/2016) 10 tablet 0  . enoxaparin (LOVENOX) 100 MG/ML injection Inject 1 mL (100 mg total) into the skin every 12 (twelve) hours. (Patient not taking: Reported on 11/10/2016) 10 Syringe 1   No current facility-administered medications for this encounter.     Codeine and Lipitor [atorvastatin]  REVIEW OF SYSTEMS: All systems negative except as listed in HPI, PMH and Problem list.  Vital Signs:  Doppler Pressure 110               Automatc BP: 119/83 (105) HR:75  SPO2:100  %  Weight: 220.6 lb w/o eqt Last weight: 218.8 lb Home weights: 216 lbs   Wt Readings from Last 3 Encounters:  10/13/16 219 lb 9.6 oz (99.6 kg)  10/07/16 223 lb (101.2 kg)  10/01/16 225 lb 12.8 oz (102.4 kg)    Physical Exam: GENERAL: NAD. Ambulates without difficulty HEENT: Normal NECK: Supple, JVP 5 cm.  2+ bilaterally, no bruits.  No lymphadenopathy or thyromegaly appreciated.   CARDIAC:  Mechanical heart sounds with LVAD hum present.  LUNGS:  Clear to auscultation bilaterally. Dull breath sounds left base ABDOMEN:  Soft, round, nontender, positive bowel sounds x4.     LVAD  exit site: well-healed and incorporated.  Dressing dry and intact.  No erythema or drainage.  Stabilization device present and accurately applied.  Driveline dressing is being changed daily per sterile technique. EXTREMITIES:  Warm and dry, no cyanosis, clubbing, rash or edema  NEUROLOGIC:  Alert and oriented x 4.  Gait steady.  No aphasia.  No dysarthria.  Affect pleasant.     ASSESSMENT AND PLAN:  1. Chronic end-stage biventricular systolic HF: LVEF 0000000 due to ICM -> s/p Echo 08/24/16 LVEF 15%, RV mild dilated, moderately reduced. 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   - Continues to improved s/p VAD. NYHA I-II - Volume status looks great. Will decrease torsemide to 20 daily - VAD parameters stable except for PI events and we will be cutting back torsemide - Stop digoxin - Warfarin with goal INR 2.0 - 2.5 + ASA 81 daily. INR 1.98 today. Avoid lovenox in future due to epistaxis. LDH 171.  - MAP improved with recent addition of losartan but back up again today. Will follow closely. If still up at next visit would stop demadex and losartan and switch to [facility].  - Had begun work up for Transplant via [facility] [place]. Will need to be free from Tobacco and cocaine x 6 months before work up can continue. Will continue routine screens. I discussed need for complete abstinence. Seems  to be doing well with this.  2. CAD: Denies CP. 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  3. DMII: Hgb A1C 8.3 on 08/26/2016 4. Tobacco abuse - Says he has not smoked since prior to admission for LVAD. Cotinine level minimally elevated. UDS remain negative - Congratulated on his cessation and encouraged not to resume.  - Will continue routine cotinine screens and UDS as part of transplant w/u 5. Atrial fibrillation, paroxysmal - NSR on amio.  - Amiodarone decreased to 200 mg daily recently. Will continue for now 6. Left pleural effusion -underwent repeat thoracentesis recently. Recheck CXR next visit.   7. Epistaxis --resolved. Avoid lovenox in future --Hgb down to 9.8 today. MCV ok. Will follow.   Glori Bickers MD 11/13/2016

## 2016-11-17 ENCOUNTER — Ambulatory Visit (HOSPITAL_COMMUNITY)
Admission: RE | Admit: 2016-11-17 | Discharge: 2016-11-17 | Disposition: A | Discharge status: Home / Self Care | Payer: Medicare HMO | Source: Ambulatory Visit | Attending: Internal Medicine | Admitting: Internal Medicine

## 2016-11-17 ENCOUNTER — Other Ambulatory Visit (HOSPITAL_COMMUNITY): Payer: Self-pay | Admitting: *Deleted

## 2016-11-17 ENCOUNTER — Ambulatory Visit (HOSPITAL_COMMUNITY)
Admission: RE | Admit: 2016-11-17 | Discharge: 2016-11-17 | Disposition: A | Discharge status: Home / Self Care | Payer: Medicare HMO | Source: Ambulatory Visit | Attending: Cardiology | Admitting: Cardiology

## 2016-11-17 ENCOUNTER — Other Ambulatory Visit (HOSPITAL_COMMUNITY): Payer: Self-pay | Admitting: Pharmacist

## 2016-11-17 VITALS — BP 118/0 | Wt 223.4 lb

## 2016-11-17 DIAGNOSIS — R188 Other ascites: Secondary | ICD-10-CM | POA: Diagnosis not present

## 2016-11-17 DIAGNOSIS — I517 Cardiomegaly: Secondary | ICD-10-CM | POA: Diagnosis not present

## 2016-11-17 DIAGNOSIS — Z87891 Personal history of nicotine dependence: Secondary | ICD-10-CM | POA: Insufficient documentation

## 2016-11-17 DIAGNOSIS — I480 Paroxysmal atrial fibrillation: Secondary | ICD-10-CM | POA: Insufficient documentation

## 2016-11-17 DIAGNOSIS — Z95810 Presence of automatic (implantable) cardiac defibrillator: Secondary | ICD-10-CM | POA: Insufficient documentation

## 2016-11-17 DIAGNOSIS — J90 Pleural effusion, not elsewhere classified: Secondary | ICD-10-CM | POA: Insufficient documentation

## 2016-11-17 DIAGNOSIS — R040 Epistaxis: Secondary | ICD-10-CM | POA: Diagnosis not present

## 2016-11-17 DIAGNOSIS — Z7982 Long term (current) use of aspirin: Secondary | ICD-10-CM | POA: Insufficient documentation

## 2016-11-17 DIAGNOSIS — Z79899 Other long term (current) drug therapy: Secondary | ICD-10-CM | POA: Insufficient documentation

## 2016-11-17 DIAGNOSIS — Z7901 Long term (current) use of anticoagulants: Secondary | ICD-10-CM | POA: Insufficient documentation

## 2016-11-17 DIAGNOSIS — I2510 Atherosclerotic heart disease of native coronary artery without angina pectoris: Secondary | ICD-10-CM | POA: Insufficient documentation

## 2016-11-17 DIAGNOSIS — J449 Chronic obstructive pulmonary disease, unspecified: Secondary | ICD-10-CM | POA: Insufficient documentation

## 2016-11-17 DIAGNOSIS — J069 Acute upper respiratory infection, unspecified: Secondary | ICD-10-CM | POA: Insufficient documentation

## 2016-11-17 DIAGNOSIS — Z95811 Presence of heart assist device: Secondary | ICD-10-CM | POA: Diagnosis not present

## 2016-11-17 DIAGNOSIS — Z5181 Encounter for therapeutic drug level monitoring: Secondary | ICD-10-CM

## 2016-11-17 DIAGNOSIS — I5020 Unspecified systolic (congestive) heart failure: Secondary | ICD-10-CM

## 2016-11-17 DIAGNOSIS — Z87898 Personal history of other specified conditions: Secondary | ICD-10-CM | POA: Insufficient documentation

## 2016-11-17 DIAGNOSIS — I5084 End stage heart failure: Secondary | ICD-10-CM

## 2016-11-17 DIAGNOSIS — I700 Atherosclerosis of aorta: Secondary | ICD-10-CM | POA: Insufficient documentation

## 2016-11-17 DIAGNOSIS — I10 Essential (primary) hypertension: Secondary | ICD-10-CM | POA: Insufficient documentation

## 2016-11-17 DIAGNOSIS — F1911 Other psychoactive substance abuse, in remission: Secondary | ICD-10-CM

## 2016-11-17 DIAGNOSIS — E119 Type 2 diabetes mellitus without complications: Secondary | ICD-10-CM | POA: Diagnosis not present

## 2016-11-17 DIAGNOSIS — Z794 Long term (current) use of insulin: Secondary | ICD-10-CM | POA: Diagnosis not present

## 2016-11-17 DIAGNOSIS — F329 Major depressive disorder, single episode, unspecified: Secondary | ICD-10-CM | POA: Diagnosis not present

## 2016-11-17 DIAGNOSIS — Z951 Presence of aortocoronary bypass graft: Secondary | ICD-10-CM | POA: Insufficient documentation

## 2016-11-17 DIAGNOSIS — I5082 Biventricular heart failure: Secondary | ICD-10-CM | POA: Diagnosis not present

## 2016-11-17 DIAGNOSIS — I5022 Chronic systolic (congestive) heart failure: Secondary | ICD-10-CM | POA: Diagnosis not present

## 2016-11-17 DIAGNOSIS — J9811 Atelectasis: Secondary | ICD-10-CM | POA: Diagnosis not present

## 2016-11-17 LAB — RAPID URINE DRUG SCREEN, HOSP PERFORMED
Amphetamines: NOT DETECTED
Barbiturates: NOT DETECTED
Benzodiazepines: NOT DETECTED
Cocaine: NOT DETECTED
OPIATES: NOT DETECTED
Tetrahydrocannabinol: NOT DETECTED

## 2016-11-17 LAB — PROTIME-INR
INR: 3.01
Prothrombin Time: 31.9 seconds — ABNORMAL HIGH (ref 11.4–15.2)

## 2016-11-17 MED ORDER — TORSEMIDE 20 MG PO TABS: 20.0000 mg | ORAL_TABLET | Freq: Every day | ORAL | 3 refills | Status: DC

## 2016-11-17 NOTE — Patient Instructions (Addendum)
Monday February 19th at 1:00 pm to do the [facility] Awareness Day. Support group is also this night at 5:00 pm.   STOP Digoxin.   Take TWO (40 mg) torsemide today and Thursday.   Starting Friday alternate days Torsemide ONE pill one day ant TWO pills the next.   Will call you with INR results  Here are some tips for washing:  . Avoid getting the driveline exit site dressing wet, and consider planning bathing times around exit site dressing changes. . Use only the shower bag we gave you to shower with and don't get creative with your equipment to shower. Water and electricity do not mix and will cause your pump to stop.   . Sit on a chair or bathing stool in the bathtub or shower stall, and use a basin of warm water and washcloth or sponge to wash. Or, wash at the sink while standing on a towel, so as not to get the floor or bath rug wet. . To wash your hair, try using a hand-held shower wand or sprayer while standing over the kitchen sink or bathtub. . Be sure that all floor surfaces are dry when walking around after bathing, to avoid slipping. . Do not use powder around the exit site dressing. . Anytime your dressing appears wet CHANGE IT!   ADVANCED CARDIAC CARE PROGRAM (Guidelines for cold/cough symptoms)  ? Common medications you CAN USE:  MEDICATIONS OKAY TO USE DO NOT USE  Benadryl or Tablet Only (Plain Only) Do not use any that say "Congestion"  Claritin (Loratadine)   Coricidin HBP   Mussinex Plain   Delsym Cough   Honey Cough Do not use Honey COLD, Honey Flu  Robitussin (Plain or DM only) Do not use Robitussin Pills or Syrup that says "PE, PM, or CF"  Vicks 44 Cough and Vicks 44 E Do not use Vicks 44 D  Vicks Vapor Rub   Saline Nasal Spray   Ziacam Nasal Spray    ? You CAN TAKE medications that have the following ingredients:  MEDICATION USED FOR:  Dextromethorphan (Delsym and Robitussin) Cough that occurs daily or prevents sleeping at night  Guaifenesin  (Mussinex) Loosens phlegm/mucus in the lungs so it is easier to cough up  Diphenhydramine (Benadryl) Cetirizine (Zyrtec) Loratadine (Claritin) Sneezing, runny nose, itchy/watery eyes, drainage in the throat   ? DO NOT TAKE any of the following medications:  NyQuil or DayQuil  Alka-Seltzer of any kind  Tylenol Cold  Sudafed  Comtrex  TheraFlu  Dimetapp  Medicated Nasal sprays - Afrin, Neo-Synephrine, etc  Drixoral  Chlor-Trimeton   ? DO NOT TAKE any medications that have the following ingredients  Pseudoephedrine  Epinephrine  Phenylephrine  Brompheniramine  Chlorpheniramine   ? Before buying any mediations "READ THE LABELS CAREFULLY!!!"  ? This is not a complete list of all cold/cough medications that you SHOULD NOT TAKE!!!  ? If you are unsure about a medication, CALL us!!!

## 2016-11-17 NOTE — Progress Notes (Signed)
CSW met with patient in the clinic for VAD appointment. CSW discussed past drug use and current management of recovery. Patient states he continues to be clean and he has been going to AA/NA meetings for added support. Patient spoke about changing his old ways and has primarily been home and limiting contacts with negative people from his past. CSW spoke of support, continued attendance at [employer] and referral for counseling. Patient reports he is now driving and beginning to feel more independent. CSW will follow up with some counseling options. Patient appears to be coping as well as expected at this time. CSW will continue to follow for support and resources. Raquel Sarna, [place], [place]

## 2016-11-17 NOTE — Progress Notes (Signed)
Patient presents for 1 week  follow up in [facility] today for driveline management. Reports no problems with VAD equipment or concerns with drive line. State he feels bloated, SOB and has needed extra torsemide the last 2 days. Reports URI symptoms and is taking decongestants/cough and cold medicine. Provided list of heart approved medications that are safe to use and not impact BP.   Vital Signs:  Doppler Pressure 118  Automatc BP: not picking up HR: 75   SPO2: 100%  Weight: 223.4 lb w/o eqt Last weight: 220.6 lb w/ eqt Home weights: 220 lb   VAD Indication: Destination therapy - evaluation for transplant stopped at [facility] d/t substance use.   VAD interrogation & Equipment Management: Speed: 5900 Flow: 5.2 Power: 4.7 w    PI: 3.4  Alarms: no clinical alarms Events: 30-40 PI events daily  Fixed speed 5900 Low speed limit: 5600  Primary Controller:  Replace back up battery in 28 months. Back up controller:   Replace back up battery in 28 months.  Annual Equipment Maintenance on UBC was performed on December 2017.   I reviewed the LVAD parameters from today and compared the results to the patient's prior recorded data reviewed with Dr. Haroldine Laws. LVAD interrogation was NEGATIVE for significant power changes, NEGATIVE for clinical alarms and STABLE for PI events/speed drops. No programming changes were made and pump is functioning within specified parameters. Pt is performing daily controller and system monitor self tests along with completing weekly and monthly maintenance for LVAD equipment.  LVAD equipment check completed and is in good working order. Back-up equipment present.   Exit Site Care: Drive line is being maintained weekly by his mother and sister. Drive line exit site well healed and incorporated. No further scab formation noted. The velour is fully implanted at exit site. Dressing dry and intact. No erythema or drainage. Stabilization device present and  accurately applied. Pt denies fever or chills. Pt states they have adequate dressing supplies at home. Continue weekly dressings - education provided at the visit to his mother Holley Raring. 4 weekly kits provided.   Shower instructions provided today.    Significant Events on VAD Support:  none  Device:Protecta Therapies: off Last check: 09/09/2016   BP & Labs:  MAP 118 - Doppler is reflecting MAP   [name], RN VAD Coordinator   Office: [phone] 24/7 Emergency VAD Pager: [phone]

## 2016-11-18 ENCOUNTER — Telehealth: Payer: Self-pay | Admitting: *Deleted

## 2016-11-18 ENCOUNTER — Telehealth (HOSPITAL_COMMUNITY): Payer: Self-pay | Admitting: *Deleted

## 2016-11-18 ENCOUNTER — Encounter: Payer: Self-pay | Admitting: *Deleted

## 2016-11-18 ENCOUNTER — Ambulatory Visit (HOSPITAL_COMMUNITY): Payer: Self-pay | Admitting: Pharmacist

## 2016-11-18 DIAGNOSIS — I5022 Chronic systolic (congestive) heart failure: Secondary | ICD-10-CM

## 2016-11-18 DIAGNOSIS — Z95811 Presence of heart assist device: Secondary | ICD-10-CM

## 2016-11-18 NOTE — Telephone Encounter (Signed)
Called by [facility] with concern for [facility] and cough. Patient taking torsemide as prescribed at last clinic visit and reports a decrease in daily weight. Spoke with patient on the phone personally to ensure he is taking medications as prescribed. Will call clinic if increased shob or weight gain. Dr Haroldine Laws made aware.

## 2016-11-18 NOTE — Progress Notes (Signed)
[name] will restart rehab on 11/23/16 on visit number 5.  He will complete a new consent, walk test, and restart his ITP.  He has a new referral for his restart with his LVAD.

## 2016-11-18 NOTE — Telephone Encounter (Signed)
Called Ronald Miller to discuss having him return to Cardiac Rehab.  He would like to come to the Tues/Thurs 8 am class.  He will be restarting on Feb 20.  He will have a new referral and start on visit number 5 to complete his rehab for heart failure.

## 2016-11-19 ENCOUNTER — Telehealth (HOSPITAL_COMMUNITY): Payer: Self-pay

## 2016-11-19 ENCOUNTER — Ambulatory Visit (HOSPITAL_COMMUNITY)
Admission: RE | Admit: 2016-11-19 | Discharge: 2016-11-19 | Disposition: A | Discharge status: Home / Self Care | Payer: Medicare HMO | Source: Ambulatory Visit | Attending: Internal Medicine | Admitting: Internal Medicine

## 2016-11-19 VITALS — BP 117/62 | Wt 227.0 lb

## 2016-11-19 DIAGNOSIS — F419 Anxiety disorder, unspecified: Secondary | ICD-10-CM | POA: Insufficient documentation

## 2016-11-19 DIAGNOSIS — Z7901 Long term (current) use of anticoagulants: Secondary | ICD-10-CM | POA: Diagnosis not present

## 2016-11-19 DIAGNOSIS — R040 Epistaxis: Secondary | ICD-10-CM | POA: Diagnosis not present

## 2016-11-19 DIAGNOSIS — I2510 Atherosclerotic heart disease of native coronary artery without angina pectoris: Secondary | ICD-10-CM | POA: Insufficient documentation

## 2016-11-19 DIAGNOSIS — Z95811 Presence of heart assist device: Secondary | ICD-10-CM | POA: Diagnosis not present

## 2016-11-19 DIAGNOSIS — R188 Other ascites: Secondary | ICD-10-CM

## 2016-11-19 DIAGNOSIS — R0600 Dyspnea, unspecified: Secondary | ICD-10-CM

## 2016-11-19 DIAGNOSIS — E119 Type 2 diabetes mellitus without complications: Secondary | ICD-10-CM | POA: Insufficient documentation

## 2016-11-19 DIAGNOSIS — I5082 Biventricular heart failure: Secondary | ICD-10-CM | POA: Diagnosis present

## 2016-11-19 DIAGNOSIS — Z951 Presence of aortocoronary bypass graft: Secondary | ICD-10-CM | POA: Insufficient documentation

## 2016-11-19 DIAGNOSIS — Z794 Long term (current) use of insulin: Secondary | ICD-10-CM | POA: Insufficient documentation

## 2016-11-19 DIAGNOSIS — I5022 Chronic systolic (congestive) heart failure: Secondary | ICD-10-CM | POA: Insufficient documentation

## 2016-11-19 DIAGNOSIS — J9811 Atelectasis: Secondary | ICD-10-CM | POA: Diagnosis not present

## 2016-11-19 DIAGNOSIS — J90 Pleural effusion, not elsewhere classified: Secondary | ICD-10-CM

## 2016-11-19 DIAGNOSIS — J449 Chronic obstructive pulmonary disease, unspecified: Secondary | ICD-10-CM | POA: Insufficient documentation

## 2016-11-19 DIAGNOSIS — I5020 Unspecified systolic (congestive) heart failure: Secondary | ICD-10-CM | POA: Diagnosis not present

## 2016-11-19 DIAGNOSIS — I110 Hypertensive heart disease with heart failure: Secondary | ICD-10-CM | POA: Insufficient documentation

## 2016-11-19 DIAGNOSIS — Z79899 Other long term (current) drug therapy: Secondary | ICD-10-CM | POA: Insufficient documentation

## 2016-11-19 DIAGNOSIS — Z7982 Long term (current) use of aspirin: Secondary | ICD-10-CM | POA: Diagnosis not present

## 2016-11-19 DIAGNOSIS — Z87891 Personal history of nicotine dependence: Secondary | ICD-10-CM | POA: Diagnosis not present

## 2016-11-19 DIAGNOSIS — I480 Paroxysmal atrial fibrillation: Secondary | ICD-10-CM | POA: Insufficient documentation

## 2016-11-19 MED ORDER — TORSEMIDE 20 MG PO TABS: 40.0000 mg | ORAL_TABLET | Freq: Every day | ORAL | 6 refills | Status: DC

## 2016-11-19 MED ORDER — CITALOPRAM HYDROBROMIDE 10 MG PO TABS: 10.0000 mg | ORAL_TABLET | Freq: Every day | ORAL | 6 refills | Status: DC

## 2016-11-19 NOTE — Patient Instructions (Signed)
INCREASE Torsemide to 40 mg (2 tabs) once daily.  START Celexa 10 mg tablet once daily.  Return next Thursday as scheduled. Appointment highlighted on next page.  Call VAD pager for any emergent issues/concerns that cannot wait for a return call from voicemail.  Do the following things EVERYDAY: 1) Weigh yourself in the morning before breakfast. Write it down and keep it in a log. 2) Take your medicines as prescribed 3) Eat low salt foods-Limit salt (sodium) to 2000 mg per day.  4) Stay as active as you can everyday 5) Limit all fluids for the day to less than 2 liters

## 2016-11-19 NOTE — Progress Notes (Signed)
Advanced Heart Failure Clinic Note   Referring Provider: Darylene Price, FNP Primary Care: [facility] Primary Cardiologist: Dr Haroldine Laws.   HPI:  Ronald Miller. is a 61 y.o. male with history of chronic systolic HF s/p Medtronic ICD 2014, CAD s/p CABG in 2000, HTN, Hx of cocaine abuse, Tobacco abuse, Depression, and COPD.   Underwent HM 3 placement 09/09/16. Post-op course complicated by RV failure, volume overload, left pleural effusion and PAF. He was diuresed successfully. Milrinone weaned to off. Underwent ramp echo prior to d/c and speed adjusted. Also underwent left thoracentesis with IR (600 cc off) with moderate improvement in size of effusion. Warfarin loaded. INR 2.0. Co-ox on date of d/c was 52% but he was ambulating well without symptoms. PICC line pulled prior to discharge.   In 10/13/16 had repeat thoracentesis.   He presents today for add on VAD visit for worsening SOB. States she has been more SOB over the past week. Weight up 4 lbs from last visit. Took torsemide 40 mg for last 3 days.  States he has been more SOB walking around the house. No further nose bleeds. Mild cough, but no production. No fevers, chills, N/V, diarrhea.  Has been very "gassy".  Also feeling very anxious.  NO BRBPR or melena. Denies smoking or drugs.   Denies LVAD trauma. Denies driveline trauma, erythema, or drainage. No ICD shocks.   Reports taking Coumadin as prescribed and adherence to anticoagulation based dietary restrictions.  Denies bright red blood per rectum or melena, no dark urine or hematuria.    VAD Indication: Destination therapy    VAD interrogation & Equipment Management: Speed:5900 Flow: 5.4 Power: 4.7 PI: 3.2  Alarms: No clinical alarms. Events: ~ 20 PI events daily.  Fixed speed 5900 Low speed limit: 5600   Labs (12/17): K 4.4, creatinine 0.92, LFTs normal, BNP 524, hgb 9.7, LDH 187, INR 1.75  Past Medical History:  Diagnosis Date  . AICD  (automatic cardioverter/defibrillator) present   . ASCVD (arteriosclerotic cardiovascular disease)   . Chronic systolic CHF (congestive heart failure) ([place])   . COPD (chronic obstructive pulmonary disease) ([place])   . Coronary artery disease   . Depression   . Diabetes mellitus   . History of cocaine abuse   . Hypertension   . Presence of permanent cardiac pacemaker   . Shortness of breath dyspnea   . Suicidal ideation   . Tobacco abuse     Current Outpatient Prescriptions  Medication Sig Dispense Refill  . acetaminophen (TYLENOL) 500 MG tablet Take 1,000 mg by mouth every 6 (six) hours as needed for mild pain or headache.    . albuterol (PROVENTIL HFA;VENTOLIN HFA) 108 (90 BASE) MCG/ACT inhaler Inhale 2 puffs into the lungs every 6 (six) hours as needed for wheezing or shortness of breath.    Marland Kitchen amiodarone (PACERONE) 200 MG tablet Take 1 tablet (200 mg total) by mouth daily. 60 tablet 6  . amoxicillin-clavulanate (AUGMENTIN) 875-125 MG tablet Take 1 tablet by mouth 2 (two) times daily. Take while nasal packing is in place. STOP when packing removed. (Patient not taking: Reported on 11/10/2016) 10 tablet 0  . aspirin EC 81 MG tablet Take 81 mg by mouth daily.     Marland Kitchen atorvastatin (LIPITOR) 20 MG tablet Take 20 mg by mouth at bedtime.     . baclofen (LIORESAL) 10 MG tablet Take 10 mg by mouth 3 (three) times daily as needed for muscle spasms.    Marland Kitchen dextromethorphan-guaiFENesin (  MUCINEX DM) 30-600 MG 12hr tablet Take 1 tablet by mouth 2 (two) times daily. 60 tablet 0  . docusate sodium (COLACE) 100 MG capsule Take 2 capsules (200 mg total) by mouth daily. 30 capsule 6  . enoxaparin (LOVENOX) 100 MG/ML injection Inject 1 mL (100 mg total) into the skin every 12 (twelve) hours. (Patient not taking: Reported on 11/10/2016) 10 Syringe 1  . escitalopram (LEXAPRO) 20 MG tablet Take 1 tablet (20 mg total) by mouth daily. 30 tablet 0  . gabapentin (NEURONTIN) 100 MG capsule Take 2 capsules (200 mg total) by  mouth 2 (two) times daily. 120 capsule 3  . insulin aspart (NOVOLOG FLEXPEN) 100 UNIT/ML FlexPen Inject 10 Units into the skin 3 (three) times daily with meals.    . Insulin Detemir (LEVEMIR) 100 UNIT/ML Pen Inject 10 Units into the skin 2 (two) times daily at 8 am and 10 pm. 15 pen 11  . losartan (COZAAR) 25 MG tablet Take 1 tablet (25 mg total) by mouth daily. 30 tablet 0  . magnesium oxide (MAG-OX) 400 (241.3 Mg) MG tablet Take 1 tablet (400 mg total) by mouth daily. 30 tablet 0  . Multiple Vitamin (MULTIVITAMIN WITH MINERALS) TABS tablet Take 1 tablet by mouth daily. Sentry/Centrum    . Omega-3 Fatty Acids (FISH OIL) 1000 MG CAPS Take 1,000 mg by mouth daily with supper.     . pantoprazole (PROTONIX) 40 MG tablet Take 1 tablet (40 mg total) by mouth daily. 30 tablet 6  . potassium chloride 20 MEQ TBCR Take 20 mEq by mouth daily. 60 tablet 6  . senna (SENOKOT) 8.6 MG TABS tablet Take 2 tablets (17.2 mg total) by mouth at bedtime as needed. Contact PCP for further refills, this is a courtesy refill (Patient taking differently: Take 2 tablets by mouth at bedtime as needed (constipation). ) 180 each 0  . spironolactone (ALDACTONE) 25 MG tablet Take 1 tablet (25 mg total) by mouth daily. 90 tablet 1  . torsemide (DEMADEX) 20 MG tablet Take 1-2 tablets (20-40 mg total) by mouth daily. Alternate 20 mg one day and 40 mg the next day. 45 tablet 3  . traMADol (ULTRAM) 50 MG tablet Take 1 tablet (50 mg total) by mouth every 12 (twelve) hours as needed for severe pain. 60 tablet 0  . warfarin (COUMADIN) 5 MG tablet Take 1.5 tablets (7.5 mg total) by mouth daily. And as prescribed on blood checks 60 tablet 11   No current facility-administered medications for this encounter.     Codeine and Lipitor [atorvastatin]  REVIEW OF SYSTEMS: All systems negative except as listed in HPI, PMH and Problem list.  Vital Signs:  Doppler Pressure 102         Automatc BP:  HR: 82 SPO2: 96  %  Weight: 220.6 lb w/o  eqt Last weight: 218.8 lb Home weights: 216 lbs   Wt Readings from Last 3 Encounters:  11/19/16 227 lb (103 kg)  11/17/16 223 lb 6.4 oz (101.3 kg)  10/13/16 219 lb 9.6 oz (99.6 kg)    Physical Exam: GENERAL: NAD. In [place].  HEENT: Normal NECK: Supple, JVP 7-8cm. 2+ bilaterally, no bruits.  No lymphadenopathy or thyromegaly appreciated.   CARDIAC:  Mechanical heart sounds with LVAD hum present.  LUNGS:  Bell left basilar sounds, otherwise clear.  ABDOMEN:  Soft, NT, mildly distended, no HSM. No bruits or masses. +BS  LVAD exit site: well-healed and incorporated.  Dressing dry and intact.  No erythema or  drainage.  Stabilization device present and accurately applied.  Driveline dressing is being changed daily per sterile technique. EXTREMITIES:  Warm and dry, no cyanosis, clubbing, rash. No peripheral edema.  NEUROLOGIC:  Alert and oriented x 4.  Gait steady.  No aphasia.  No dysarthria.  Affect pleasant.     ASSESSMENT AND PLAN:  1. Chronic end-stage biventricular systolic HF: LVEF 0000000 due to ICM -> s/p Echo 08/24/16 LVEF 15%, RV mild dilated, moderately reduced. 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   - Continues to improved s/p VAD. NYHA I-II - Volume status somewhat elevated. Will increase chronic torsemide dosing to 40 mg daily. ReDs vest 33%, upper limits of normal.  - Can take 5 mg metolazone as needed for weight gain of 3 lbs overnight or 5 lbs within one week.  - VAD parameters stable except for PI events and we will be cutting back torsemide - Warfarin with goal INR 2.0 - 2.5 + ASA 81 daily. INR 3.01 earlier. Avoid lovenox in future due to epistaxis. LDH 171.  - MAP mildly elevated but stable.  - Consider switching to [facility] eventually - Had begun work up for Transplant via [facility]. Will need to be free from Tobacco and cocaine x 6 months before work up can continue. Will continue routine screens.Have discussed need for complete abstinence. Seems to be doing well with this.  2. CAD: No CP.  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  - No change to current plans.  3. DMII:  - Hgb A1C 8.3 on 08/26/2016. No change.  4. Tobacco abuse - Says he has not smoked since prior to admission for LVAD. Cotinine level minimally elevated. UDS remain negative - Congratulated on his cessation and encouraged not to resume.  - Will continue routine cotinine screens and UDS as part of transplant w/u 5. Atrial fibrillation, paroxysmal - NSR on amio.  - Continue Amiodarone 200 mg for now with only recent decrease.  6. Left pleural effusion -underwent repeat thoracentesis recently.  - CXR earlier this week with stable L small pleural effusion. Mild bibasilar atelectasis. 7. Epistaxis -- No further nose bleeds.  -- Hgb 9.8 earlier this week. MCV ok. No change to current plan.  8. Anxiety - Think this could be a large contributor to his numerous non-specific complaint.  - Will start Celexa 10 mg daily and titrate up as needed.   Keep follow up as planned.   [name] Friar, PA-C  11/19/2016   Patient seen and examined with Oda Kilts, PA-C. We discussed all aspects of the encounter. I agree with the assessment and plan as stated above.   Mr. Ridolfi presents for an unscheduled visit due to concerns over progressive volume overload and SOB> He has called the office multiple times over the last week and adjustments have been made with little benefit. He called back today and said he was "on his way in." On exam he has very mild RHF but his lungs are clear and there is no evidence of significant recurrence of his pleural effusion. REDs is 33%. VAD parameters look good. I had a long talk with him about the situation and I suspect main issue is depression/anxiety. We will start back Celexa (avooid benzos with h/u addiction). If continues to have symptoms will schedule RHC to assess filling pressures and output. Cotinine screens remain negative and will need to refer back  to [facility] to restart the transplant w/u process.   Ronald Snook,MD 11:03  PM

## 2016-11-19 NOTE — Telephone Encounter (Signed)
Received VM on VAD answering machine from [employer] stating continued SOB. Attempted to return call to patient, patient answered stating "I'm almost there to the clinic I need to be seen I cannot breathe". Dr. Haroldine Laws and team made aware, will add patient to schedule to be assessed.  Renee Pain, RN

## 2016-11-20 NOTE — Progress Notes (Signed)
Advanced Heart Failure Clinic Note   Referring Provider: Darylene Price, FNP Primary Care: [facility] [place] Primary Cardiologist: Dr Haroldine Laws.   HPI:  Ronald Miller. is a 61 y.o. male with history of chronic systolic HF s/p Medtronic ICD 2014, CAD s/p CABG in 2000, HTN, Hx of cocaine abuse, Tobacco abuse, Depression, and COPD.   Underwent HM 3 placement 09/09/16. Post-op course complicated by RV failure, volume overload, left pleural effusion and PAF. He was diuresed successfully. Milrinone weaned to off. Underwent ramp echo prior to d/c and speed adjusted. Also underwent left thoracentesis with IR (600 cc off) with moderate improvement in size of effusion. Warfarin loaded. INR 2.0. Co-ox on date of d/c was 52% but he was ambulating well without symptoms. PICC line pulled prior to discharge.   In 10/13/16 had repeat thoracentesis.   He presents today for VAD follow up. He has multiple complaints. Main issues are that he has had a URI and is asking what medicines he can take for his cold. Also notes some LE edema and bloating after cutting back his torsemide from 40 daily to 4 alternating with 20 daily. Able to do all ADLs without too much problem. Denies orthopnea, PND or edema.  No problems with pump or driveline site. Denies BRBPR or melena. No fever or chills. Denies smoking or drugs.   Denies LVAD alarms.  Denies driveline trauma, erythema or drainage.  Denies ICD shocks.   Reports taking Coumadin as prescribed and adherence to anticoagulation based dietary restrictions.  Denies bright red blood per rectum or melena, no dark urine or hematuria.    VAD Indication: Destination therapy    VAD interrogation & Equipment Management: Speed:5900 Flow: 5.5 Power:4.7 w PI:3.1  Alarms: no clinical alarms Events: 30-40 PI events daily   Labs (12/17): K 4.4, creatinine 0.92, LFTs normal, BNP 524, hgb 9.7, LDH 187, INR 1.75  Past Medical History:  Diagnosis Date  .  AICD (automatic cardioverter/defibrillator) present   . ASCVD (arteriosclerotic cardiovascular disease)   . Chronic systolic CHF (congestive heart failure) ([place])   . COPD (chronic obstructive pulmonary disease) ([place])   . Coronary artery disease   . Depression   . Diabetes mellitus   . History of cocaine abuse   . Hypertension   . Presence of permanent cardiac pacemaker   . Shortness of breath dyspnea   . Suicidal ideation   . Tobacco abuse     Current Outpatient Prescriptions  Medication Sig Dispense Refill  . acetaminophen (TYLENOL) 500 MG tablet Take 1,000 mg by mouth every 6 (six) hours as needed for mild pain or headache.    . albuterol (PROVENTIL HFA;VENTOLIN HFA) 108 (90 BASE) MCG/ACT inhaler Inhale 2 puffs into the lungs every 6 (six) hours as needed for wheezing or shortness of breath.    Marland Kitchen amiodarone (PACERONE) 200 MG tablet Take 1 tablet (200 mg total) by mouth daily. 60 tablet 6  . amoxicillin-clavulanate (AUGMENTIN) 875-125 MG tablet Take 1 tablet by mouth 2 (two) times daily. Take while nasal packing is in place. STOP when packing removed. (Patient not taking: Reported on 11/10/2016) 10 tablet 0  . aspirin EC 81 MG tablet Take 81 mg by mouth daily.     Marland Kitchen atorvastatin (LIPITOR) 20 MG tablet Take 20 mg by mouth at bedtime.     . baclofen (LIORESAL) 10 MG tablet Take 10 mg by mouth 3 (three) times daily as needed for muscle spasms.    . citalopram (CELEXA) 10  MG tablet Take 1 tablet (10 mg total) by mouth daily. 30 tablet 6  . dextromethorphan-guaiFENesin (MUCINEX DM) 30-600 MG 12hr tablet Take 1 tablet by mouth 2 (two) times daily. 60 tablet 0  . docusate sodium (COLACE) 100 MG capsule Take 2 capsules (200 mg total) by mouth daily. 30 capsule 6  . enoxaparin (LOVENOX) 100 MG/ML injection Inject 1 mL (100 mg total) into the skin every 12 (twelve) hours. (Patient not taking: Reported on 11/10/2016) 10 Syringe 1  . escitalopram (LEXAPRO) 20 MG tablet Take 1 tablet (20 mg total) by  mouth daily. 30 tablet 0  . gabapentin (NEURONTIN) 100 MG capsule Take 2 capsules (200 mg total) by mouth 2 (two) times daily. 120 capsule 3  . insulin aspart (NOVOLOG FLEXPEN) 100 UNIT/ML FlexPen Inject 10 Units into the skin 3 (three) times daily with meals.    . Insulin Detemir (LEVEMIR) 100 UNIT/ML Pen Inject 10 Units into the skin 2 (two) times daily at 8 am and 10 pm. 15 pen 11  . losartan (COZAAR) 25 MG tablet Take 1 tablet (25 mg total) by mouth daily. 30 tablet 0  . magnesium oxide (MAG-OX) 400 (241.3 Mg) MG tablet Take 1 tablet (400 mg total) by mouth daily. 30 tablet 0  . Multiple Vitamin (MULTIVITAMIN WITH MINERALS) TABS tablet Take 1 tablet by mouth daily. Sentry/Centrum    . Omega-3 Fatty Acids (FISH OIL) 1000 MG CAPS Take 1,000 mg by mouth daily with supper.     . pantoprazole (PROTONIX) 40 MG tablet Take 1 tablet (40 mg total) by mouth daily. 30 tablet 6  . potassium chloride 20 MEQ TBCR Take 20 mEq by mouth daily. 60 tablet 6  . senna (SENOKOT) 8.6 MG TABS tablet Take 2 tablets (17.2 mg total) by mouth at bedtime as needed. Contact PCP for further refills, this is a courtesy refill (Patient taking differently: Take 2 tablets by mouth at bedtime as needed (constipation). ) 180 each 0  . spironolactone (ALDACTONE) 25 MG tablet Take 1 tablet (25 mg total) by mouth daily. 90 tablet 1  . torsemide (DEMADEX) 20 MG tablet Take 2 tablets (40 mg total) by mouth daily. 60 tablet 6  . traMADol (ULTRAM) 50 MG tablet Take 1 tablet (50 mg total) by mouth every 12 (twelve) hours as needed for severe pain. 60 tablet 0  . warfarin (COUMADIN) 5 MG tablet Take 1.5 tablets (7.5 mg total) by mouth daily. And as prescribed on blood checks 60 tablet 11   No current facility-administered medications for this encounter.     Codeine and Lipitor [atorvastatin]  REVIEW OF SYSTEMS: All systems negative except as listed in HPI, PMH and Problem list.  Vital Signs:  Doppler Pressure 118               Automatc BP: 119/83 (105) HR:75  SPO2:100  %    Wt Readings from Last 3 Encounters:  11/19/16 227 lb (103 kg)  11/17/16 223 lb 6.4 oz (101.3 kg)  10/13/16 219 lb 9.6 oz (99.6 kg)    Physical Exam: GENERAL: NAD. Ambulates without difficulty HEENT: Normal NECK: Supple, JVP 7-8 cm.  2+ bilaterally, no bruits.  No lymphadenopathy or thyromegaly appreciated.   CARDIAC:  Mechanical heart sounds with LVAD hum present.  LUNGS:  Clear to auscultation bilaterally. Dull breath sounds left base ABDOMEN:  Soft, round, nontender, positive bowel sounds x4.     LVAD exit site: well-healed and incorporated.  Dressing dry and intact.  No erythema  or drainage.  Stabilization device present and accurately applied.  Driveline dressing is being changed daily per sterile technique. EXTREMITIES:  Warm and dry, no cyanosis, clubbing, rash or edema  NEUROLOGIC:  Alert and oriented x 4.  Gait steady.  No aphasia.  No dysarthria.  Affect pleasant.     ASSESSMENT AND PLAN:  1. Chronic end-stage biventricular systolic HF: LVEF 0000000 due to ICM -> s/p Echo 08/24/16 LVEF 15%, RV mild dilated, moderately reduced. 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   - Volume status up a bit today with R>L HF. Increase torsemide to 40 alternating with 20 daily - Watch fluid intake - Stop digoxin - Warfarin with goal INR 2.0 - 2.5 + ASA 81 daily. INR 3.01 today. Avoid lovenox in future due to epistaxis.  - MAP improved with recent addition of losartan but still up again today. Will follow closely. If still up at next visit will switch to [facility].  - Had begun work up for Transplant via [facility]. Will need to be free from Tobacco and cocaine x 6 months before work up can continue. Screen negative again last week. Will continue routine screens. I discussed need for complete abstinence. Seems to be doing well with this.  2. CAD: Denies CP. 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  3. DMII: Hgb A1C 8.3 on  08/26/2016 4. Tobacco abuse - Says he has not smoked since prior to admission for LVAD. Cotinine level minimally elevated. UDS remain negative - Congratulated on his cessation and encouraged not to resume.  - Will continue routine cotinine screens and UDS as part of transplant w/u 5. Atrial fibrillation, paroxysmal - NSR on amio.  - Amiodarone decreased to 200 mg daily recently. Will continue for now 6. Left pleural effusion -underwent repeat thoracentesis recently. CXR reviewed personally and only minimal residual   7. Epistaxis --resolved. Avoid lovenox in future --Hgb stable.  8. URI --Reviewed meds which are exceptable for heart patient to take. VAD coordinator provided detailed list to him.  Glori Bickers MD 11/20/2016

## 2016-11-23 ENCOUNTER — Encounter: Payer: Medicare HMO | Attending: Internal Medicine | Admitting: Respiratory Therapy

## 2016-11-23 VITALS — Ht 70.0 in | Wt 230.8 lb

## 2016-11-23 DIAGNOSIS — Z95811 Presence of heart assist device: Secondary | ICD-10-CM | POA: Diagnosis not present

## 2016-11-23 DIAGNOSIS — I5022 Chronic systolic (congestive) heart failure: Secondary | ICD-10-CM | POA: Diagnosis present

## 2016-11-23 LAB — GLUCOSE, CAPILLARY
GLUCOSE-CAPILLARY: 160 mg/dL — AB (ref 65–99)
Glucose-Capillary: 159 mg/dL — ABNORMAL HIGH (ref 65–99)

## 2016-11-23 NOTE — Progress Notes (Signed)
Cardiac Individual Treatment Plan  Patient Details  Name: Ronald Miller. MRN: [id number] Date of Birth: 1956/08/15 Referring Provider:   Flowsheet Row Cardiac Rehab from 11/23/2016 in [facility] Cardiac and Pulmonary Rehab  Referring Provider  Glori Bickers MD      Initial Encounter Date:  Flowsheet Row Cardiac Rehab from 11/23/2016 in [facility] Cardiac and Pulmonary Rehab  Date  11/23/16  Referring Provider  Glori Bickers MD      Visit Diagnosis: Heart failure, chronic systolic ([place])  LVAD (left ventricular assist device) present [facility])  Patient's Home Medications on Admission:  Current Outpatient Prescriptions:  .  acetaminophen (TYLENOL) 500 MG tablet, Take 1,000 mg by mouth every 6 (six) hours as needed for mild pain or headache., Disp: , Rfl:  .  albuterol (PROVENTIL HFA;VENTOLIN HFA) 108 (90 BASE) MCG/ACT inhaler, Inhale 2 puffs into the lungs every 6 (six) hours as needed for wheezing or shortness of breath., Disp: , Rfl:  .  amiodarone (PACERONE) 200 MG tablet, Take 1 tablet (200 mg total) by mouth daily., Disp: 60 tablet, Rfl: 6 .  amoxicillin-clavulanate (AUGMENTIN) 875-125 MG tablet, Take 1 tablet by mouth 2 (two) times daily. Take while nasal packing is in place. STOP when packing removed. (Patient not taking: Reported on 11/10/2016), Disp: 10 tablet, Rfl: 0 .  aspirin EC 81 MG tablet, Take 81 mg by mouth daily. , Disp: , Rfl:  .  atorvastatin (LIPITOR) 20 MG tablet, Take 20 mg by mouth at bedtime. , Disp: , Rfl:  .  baclofen (LIORESAL) 10 MG tablet, Take 10 mg by mouth 3 (three) times daily as needed for muscle spasms., Disp: , Rfl:  .  citalopram (CELEXA) 10 MG tablet, Take 1 tablet (10 mg total) by mouth daily., Disp: 30 tablet, Rfl: 6 .  dextromethorphan-guaiFENesin (MUCINEX DM) 30-600 MG 12hr tablet, Take 1 tablet by mouth 2 (two) times daily., Disp: 60 tablet, Rfl: 0 .  docusate sodium (COLACE) 100 MG capsule, Take 2 capsules (200 mg total) by mouth daily., Disp: 30  capsule, Rfl: 6 .  enoxaparin (LOVENOX) 100 MG/ML injection, Inject 1 mL (100 mg total) into the skin every 12 (twelve) hours. (Patient not taking: Reported on 11/10/2016), Disp: 10 Syringe, Rfl: 1 .  escitalopram (LEXAPRO) 20 MG tablet, Take 1 tablet (20 mg total) by mouth daily., Disp: 30 tablet, Rfl: 0 .  gabapentin (NEURONTIN) 100 MG capsule, Take 2 capsules (200 mg total) by mouth 2 (two) times daily., Disp: 120 capsule, Rfl: 3 .  insulin aspart (NOVOLOG FLEXPEN) 100 UNIT/ML FlexPen, Inject 10 Units into the skin 3 (three) times daily with meals., Disp: , Rfl:  .  Insulin Detemir (LEVEMIR) 100 UNIT/ML Pen, Inject 10 Units into the skin 2 (two) times daily at 8 am and 10 pm., Disp: 15 pen, Rfl: 11 .  losartan (COZAAR) 25 MG tablet, Take 1 tablet (25 mg total) by mouth daily., Disp: 30 tablet, Rfl: 0 .  magnesium oxide (MAG-OX) 400 (241.3 Mg) MG tablet, Take 1 tablet (400 mg total) by mouth daily., Disp: 30 tablet, Rfl: 0 .  Multiple Vitamin (MULTIVITAMIN WITH MINERALS) TABS tablet, Take 1 tablet by mouth daily. Sentry/Centrum, Disp: , Rfl:  .  Omega-3 Fatty Acids (FISH OIL) 1000 MG CAPS, Take 1,000 mg by mouth daily with supper. , Disp: , Rfl:  .  pantoprazole (PROTONIX) 40 MG tablet, Take 1 tablet (40 mg total) by mouth daily., Disp: 30 tablet, Rfl: 6 .  potassium chloride 20 MEQ TBCR, Take  20 mEq by mouth daily., Disp: 60 tablet, Rfl: 6 .  senna (SENOKOT) 8.6 MG TABS tablet, Take 2 tablets (17.2 mg total) by mouth at bedtime as needed. Contact PCP for further refills, this is a courtesy refill (Patient taking differently: Take 2 tablets by mouth at bedtime as needed (constipation). ), Disp: 180 each, Rfl: 0 .  spironolactone (ALDACTONE) 25 MG tablet, Take 1 tablet (25 mg total) by mouth daily., Disp: 90 tablet, Rfl: 1 .  torsemide (DEMADEX) 20 MG tablet, Take 2 tablets (40 mg total) by mouth daily., Disp: 60 tablet, Rfl: 6 .  traMADol (ULTRAM) 50 MG tablet, Take 1 tablet (50 mg total) by mouth  every 12 (twelve) hours as needed for severe pain., Disp: 60 tablet, Rfl: 0 .  warfarin (COUMADIN) 5 MG tablet, Take 1.5 tablets (7.5 mg total) by mouth daily. And as prescribed on blood checks, Disp: 60 tablet, Rfl: 11  Past Medical History: Past Medical History:  Diagnosis Date  . AICD (automatic cardioverter/defibrillator) present   . ASCVD (arteriosclerotic cardiovascular disease)   . Chronic systolic CHF (congestive heart failure) ([place])   . COPD (chronic obstructive pulmonary disease) ([place])   . Coronary artery disease   . Depression   . Diabetes mellitus   . History of cocaine abuse   . Hypertension   . Presence of permanent cardiac pacemaker   . Shortness of breath dyspnea   . Suicidal ideation   . Tobacco abuse     Tobacco Use: History  Smoking Status  . Former Smoker  . Packs/day: 1.50  . Years: 23.00  . Types: Cigarettes  . Quit date: 08/27/2015  Smokeless Tobacco  . Never Used    Labs: Recent Review Flowsheet Data    Labs for ITP Cardiac and Pulmonary Rehab Latest Ref Rng & Units 09/21/2016 09/22/2016 09/23/2016 09/24/2016 09/25/2016   Cholestrol 0 - 200 mg/dL - - - - -   LDLCALC 0 - 100 mg/dL - - - - -   HDL 40 - 60 mg/dL - - - - -   Trlycerides 0 - 200 mg/dL - - - - -   Hemoglobin A1c 4.8 - 5.6 % - - - - -   PHART 7.350 - 7.450 - - - - -   PCO2ART 32.0 - 48.0 mmHg - - - - -   HCO3 20.0 - 28.0 mmol/L - - - - -   TCO2 0 - 100 mmol/L - - - - -   ACIDBASEDEF 0.0 - 2.0 mmol/L - - - - -   O2SAT % 51.1 48.2 60.1 53.9 52.9       Exercise Target Goals: Date: 11/23/16  Exercise Program Goal: Individual exercise prescription set with THRR, safety & activity barriers. Participant demonstrates ability to understand and report RPE using BORG scale, to self-measure pulse accurately, and to acknowledge the importance of the exercise prescription.  Exercise Prescription Goal: Starting with aerobic activity 30 plus minutes a day, 3 days per week for initial  exercise prescription. Provide home exercise prescription and guidelines that participant acknowledges understanding prior to discharge.  Activity Barriers & Risk Stratification:     Activity Barriers & Cardiac Risk Stratification - 11/18/16 1447      Activity Barriers & Cardiac Risk Stratification   Activity Barriers Deconditioning;Muscular Weakness;Shortness of Breath;Decreased Ventricular Function;Back Problems   Cardiac Risk Stratification High      6 Minute Walk:     6 Minute Walk    Row Name 11/23/16 1030  6 Minute Walk   Phase Initial     Distance 755 feet     Walk Time 4 minutes     # of Rest Breaks 1  30 sec     MPH 2.14     METS 1.88     RPE 15     Perceived Dyspnea  3     VO2 Peak 6.6     Symptoms Yes (comment)     Comments SOB     Resting HR 61 bpm     Resting BP -  92 dopplar     Max Ex. HR 100 bpm     Max Ex. BP -  90 dopplar        Initial Exercise Prescription:     Initial Exercise Prescription - 11/23/16 1000      Date of Initial Exercise RX and Referring Provider   Date 11/23/16   Referring Provider Glori Bickers MD     Treadmill   MPH 1.4   Grade 0.5   Minutes 15   METs 2.07     NuStep   Level 1   Minutes 15   METs 2     REL-XR   Level 1   Minutes 15   METs 2     Prescription Details   Frequency (times per week) 2   Duration Progress to 45 minutes of aerobic exercise without signs/symptoms of physical distress     Intensity   THRR 40-80% of Max Heartrate 100-140   Ratings of Perceived Exertion 11-13   Perceived Dyspnea 0-4     Progression   Progression Continue to progress workloads to maintain intensity without signs/symptoms of physical distress.     Resistance Training   Training Prescription Yes   Weight 3 lbs   Reps 10-15      Perform Capillary Blood Glucose checks as needed.  Exercise Prescription Changes:     Exercise Prescription Changes    Row Name 11/23/16 1000              Exercise Review   Progression -  walk test results         Response to Exercise   Blood Pressure (Admit) -  92 dopplar       Blood Pressure (Exercise) -  90 dopplar       Heart Rate (Admit) 61 bpm       Heart Rate (Exercise) 100 bpm       Oxygen Saturation (Admit) 94 %       Rating of Perceived Exertion (Exercise) 15       Perceived Dyspnea (Exercise) 3       Symptoms SOB          Exercise Comments:   Discharge Exercise Prescription (Final Exercise Prescription Changes):     Exercise Prescription Changes - 11/23/16 1000      Exercise Review   Progression --  walk test results     Response to Exercise   Blood Pressure (Admit) --  92 dopplar   Blood Pressure (Exercise) --  90 dopplar   Heart Rate (Admit) 61 bpm   Heart Rate (Exercise) 100 bpm   Oxygen Saturation (Admit) 94 %   Rating of Perceived Exertion (Exercise) 15   Perceived Dyspnea (Exercise) 3   Symptoms SOB      Nutrition:  Target Goals: Understanding of nutrition guidelines, daily intake of sodium <1535m, cholesterol <2032m calories 30% from fat and 7% or less from saturated fats, daily  to have 5 or more servings of fruits and vegetables.  Biometrics:     Pre Biometrics - 11/23/16 1036      Pre Biometrics   Height '5\' 10"'  (1.778 m)   Weight 230 lb 12.8 oz (104.7 kg)   Waist Circumference 45.5 inches   Hip Circumference 43.5 inches   Waist to Hip Ratio 1.05 %   BMI (Calculated) 33.2   Single Leg Stand 8.16 seconds       Nutrition Therapy Plan and Nutrition Goals:     Nutrition Therapy & Goals - 11/23/16 1036      Nutrition Therapy   Drug/Food Interactions Statins/Certain Fruits     Intervention Plan   Intervention Prescribe, educate and counsel regarding individualized specific dietary modifications aiming towards targeted core components such as weight, hypertension, lipid management, diabetes, heart failure and other comorbidities.   Expected Outcomes Short Term Goal: Understand  basic principles of dietary content, such as calories, fat, sodium, cholesterol and nutrients.;Long Term Goal: Adherence to prescribed nutrition plan.;Short Term Goal: A plan has been developed with personal nutrition goals set during dietitian appointment.      Nutrition Discharge: Rate Your Plate Scores:     Nutrition Assessments - 11/23/16 1036      MEDFICTS Scores   Pre Score 6      Nutrition Goals Re-Evaluation:   Psychosocial: Target Goals: Acknowledge presence or absence of depression, maximize coping skills, provide positive support system. Participant is able to verbalize types and ability to use techniques and skills needed for reducing stress and depression.  Initial Review & Psychosocial Screening:     Initial Psych Review & Screening - 11/23/16 1036      Initial Review   Current issues with Current Depression;Current Anxiety/Panic;History of Depression     Family Dynamics   Good Support System? Yes  mother and sister   Comments New to recognizing his depression since his LVAD.  Was rejected from transplant list current for cocaine in urine (states he has been clean).  Still getting used to LVAD device.     Barriers   Psychosocial barriers to participate in program The patient should benefit from training in stress management and relaxation.;Psychosocial barriers identified (see note)     Screening Interventions   Interventions Encouraged to exercise;Program counselor consult      Quality of Life Scores:     Quality of Life - 11/23/16 1039      Quality of Life Scores   Health/Function Pre 19.33 %   Socioeconomic Pre 18.75 %   Psych/Spiritual Pre 21.93 %   Family Pre 29.38 %   GLOBAL Pre 21.05 %      PHQ-9: Recent Review Flowsheet Data    Depression screen [facility] 2/9 11/23/2016 05/24/2016 02/03/2016 11/06/2015   Decreased Interest 1 1 0 1   Down, Depressed, Hopeless 1 0 0 2   PHQ - 2 Score 2 1 0 3   Altered sleeping 2 2 - -   Tired, decreased energy 1 1  - -   Change in appetite 0 0 - -   Feeling bad or failure about yourself  2 1 - -   Trouble concentrating 2 0 - -   Moving slowly or fidgety/restless 1 2 - -   Suicidal thoughts 0 0 - 0   PHQ-9 Score 10 7 - -   Difficult doing work/chores Not difficult at all Not difficult at all - -      Psychosocial Evaluation and Intervention:  Psychosocial Evaluation - 11/23/16 0924      Psychosocial Evaluation & Interventions   Interventions Stress management education;Relaxation education;Encouraged to exercise with the program and follow exercise prescription   Comments Counselor met with Mr. Berg today for initial psychosocial evaluation.  He is a 61 year old who had an LVAD procedure several months ago.  He has a strong support system with lots of family close by and he currently lives with his sister and her husband.  Mr. Markey also has an adult daughter who lives locally and checks on him often by phone.  He has an extensive history of heart disease with 6 by passes 20 years ago and he also is Type 2 Diabetic.  He reports not sleeping well since the surgery in December until last night subsequent to Dr. prescribing a new medication to help with this.  He denies a history of depression or anxiety but states his Dr. thinks he "might be depressed."  Mr. Konecny claims he is typically in a good mood and kids around with others quite a bit.  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  He is on the heart transplant list and quit smoking in November (several months ago) so hoping to get a heart in the near future.  Staff and counselor will be monitoring Mr. Sahagian throughout the course of this program.        Psychosocial Re-Evaluation:   Vocational Rehabilitation: Provide vocational rehab assistance to qualifying candidates.   Vocational Rehab Evaluation & Intervention:     Vocational Rehab - 11/23/16 1035      Initial Vocational  Rehab Evaluation & Intervention   Assessment shows need for Vocational Rehabilitation No      Education: Education Goals: Education classes will be provided on a weekly basis, covering required topics. Participant will state understanding/return demonstration of topics presented.  Learning Barriers/Preferences:     Learning Barriers/Preferences - 11/23/16 1035      Learning Barriers/Preferences   Learning Barriers Exercise Concerns   Learning Preferences None      Education Topics: General Nutrition Guidelines/Fats and Fiber: -Group instruction provided by verbal, written material, models and posters to present the general guidelines for heart healthy nutrition. Gives an explanation and review of dietary fats and fiber.   Controlling Sodium/Reading Food Labels: -Group verbal and written material supporting the discussion of sodium use in heart healthy nutrition. Review and explanation with models, verbal and written materials for utilization of the food label.   Exercise Physiology & Risk Factors: - Group verbal and written instruction with models to review the exercise physiology of the cardiovascular system and associated critical values. Details cardiovascular disease risk factors and the goals associated with each risk factor.   Aerobic Exercise & Resistance Training: - Gives group verbal and written discussion on the health impact of inactivity. On the components of aerobic and resistive training programs and the benefits of this training and how to safely progress through these programs.   Flexibility, Balance, General Exercise Guidelines: - Provides group verbal and written instruction on the benefits of flexibility and balance training programs. Provides general exercise guidelines with specific guidelines to those with heart or lung disease. Demonstration and skill practice provided.   Stress Management: - Provides group verbal and written instruction about the  health risks of elevated stress, cause of high stress, and healthy ways to reduce stress.   Depression: - Provides group verbal and  written instruction on the correlation between heart/lung disease and depressed mood, treatment options, and the stigmas associated with seeking treatment.   Anatomy & Physiology of the Heart: - Group verbal and written instruction and models provide basic cardiac anatomy and physiology, with the coronary electrical and arterial systems. Review of: AMI, Angina, Valve disease, Heart Failure, Cardiac Arrhythmia, Pacemakers, and the ICD.   Cardiac Procedures: - Group verbal and written instruction and models to describe the testing methods done to diagnose heart disease. Reviews the outcomes of the test results. Describes the treatment choices: Medical Management, Angioplasty, or Coronary Bypass Surgery.   Cardiac Medications: - Group verbal and written instruction to review commonly prescribed medications for heart disease. Reviews the medication, class of the drug, and side effects. Includes the steps to properly store meds and maintain the prescription regimen. Flowsheet Row Cardiac Rehab from 11/23/2016 in [facility] Cardiac and Pulmonary Rehab  Date  11/23/16  Educator  SB  Instruction Review Code  2- meets goals/outcomes      Go Sex-Intimacy & Heart Disease, Get SMART - Goal Setting: - Group verbal and written instruction through game format to discuss heart disease and the return to sexual intimacy. Provides group verbal and written material to discuss and apply goal setting through the application of the S.M.A.R.T. Method.   Other Matters of the Heart: - Provides group verbal, written materials and models to describe Heart Failure, Angina, Valve Disease, and Diabetes in the realm of heart disease. Includes description of the disease process and treatment options available to the cardiac patient.   Exercise & Equipment Safety: - Individual verbal  instruction and demonstration of equipment use and safety with use of the equipment. Flowsheet Row Cardiac Rehab from 11/23/2016 in [facility] Cardiac and Pulmonary Rehab  Date  11/23/16  Educator  CE  Instruction Review Code  2- meets goals/outcomes      Infection Prevention: - Provides verbal and written material to individual with discussion of infection control including proper hand washing and proper equipment cleaning during exercise session. Flowsheet Row Cardiac Rehab from 11/23/2016 in [name] Cardiac and Pulmonary Rehab  Date  11/23/16  Educator  CE  Instruction Review Code  2- meets goals/outcomes      Falls Prevention: - Provides verbal and written material to individual with discussion of falls prevention and safety. Flowsheet Row Cardiac Rehab from 11/23/2016 in [facility] Cardiac and Pulmonary Rehab  Date  11/23/16  Educator  CE  Instruction Review Code  2- meets goals/outcomes      Diabetes: - Individual verbal and written instruction to review signs/symptoms of diabetes, desired ranges of glucose level fasting, after meals and with exercise. Advice that pre and post exercise glucose checks will be done for 3 sessions at entry of program. Flowsheet Row Cardiac Rehab from 11/23/2016 in [name] Cardiac and Pulmonary Rehab  Date  11/23/16  Educator  CE  Instruction Review Code  2- meets goals/outcomes       Knowledge Questionnaire Score:     Knowledge Questionnaire Score - 11/23/16 1035      Knowledge Questionnaire Score   Pre Score 21/28      Core Components/Risk Factors/Patient Goals at Admission:     Personal Goals and Risk Factors at Admission - 11/18/16 1446      Core Components/Risk Factors/Patient Goals on Admission    Weight Management Yes;Weight Loss   Intervention Weight Management: Develop a combined nutrition and exercise program designed to reach desired caloric intake, while maintaining appropriate intake of  nutrient and fiber, sodium and fats, and  appropriate energy expenditure required for the weight goal.;Weight Management: Provide education and appropriate resources to help participant work on and attain dietary goals.   Expected Outcomes Short Term: Continue to assess and modify interventions until short term weight is achieved;Long Term: Adherence to nutrition and physical activity/exercise program aimed toward attainment of established weight goal;Weight Loss: Understanding of general recommendations for a balanced deficit meal plan, which promotes 1-2 lb weight loss per week and includes a negative energy balance of [phone] kcal/d;Understanding recommendations for meals to include 15-35% energy as protein, 25-35% energy from fat, 35-60% energy from carbohydrates, less than 278m of dietary cholesterol, 20-35 gm of total fiber daily;Understanding of distribution of calorie intake throughout the day with the consumption of 4-5 meals/snacks   Sedentary Yes   Intervention Provide advice, education, support and counseling about physical activity/exercise needs.;Develop an individualized exercise prescription for aerobic and resistive training based on initial evaluation findings, risk stratification, comorbidities and participant's personal goals.   Expected Outcomes Achievement of increased cardiorespiratory fitness and enhanced flexibility, muscular endurance and strength shown through measurements of functional capacity and personal statement of participant.   Increase Strength and Stamina Yes   Intervention Provide advice, education, support and counseling about physical activity/exercise needs.;Develop an individualized exercise prescription for aerobic and resistive training based on initial evaluation findings, risk stratification, comorbidities and participant's personal goals.   Expected Outcomes Achievement of increased cardiorespiratory fitness and enhanced flexibility, muscular endurance and strength shown through measurements of  functional capacity and personal statement of participant.   Improve shortness of breath with ADL's Yes   Intervention Provide education, individualized exercise plan and daily activity instruction to help decrease symptoms of SOB with activities of daily living.   Expected Outcomes Short Term: Achieves a reduction of symptoms when performing activities of daily living.   Diabetes Yes   Intervention Provide education about signs/symptoms and action to take for hypo/hyperglycemia.;Provide education about proper nutrition, including hydration, and aerobic/resistive exercise prescription along with prescribed medications to achieve blood glucose in normal ranges: Fasting glucose 65-99 mg/dL   Expected Outcomes Short Term: Participant verbalizes understanding of the signs/symptoms and immediate care of hyper/hypoglycemia, proper foot care and importance of medication, aerobic/resistive exercise and nutrition plan for blood glucose control.;Long Term: Attainment of HbA1C < 7%.   Heart Failure Yes   Intervention Provide a combined exercise and nutrition program that is supplemented with education, support and counseling about heart failure. Directed toward relieving symptoms such as shortness of breath, decreased exercise tolerance, and extremity edema.   Expected Outcomes Improve functional capacity of life;Short term: Attendance in program 2-3 days a week with increased exercise capacity. Reported lower sodium intake. Reported increased fruit and vegetable intake. Reports medication compliance.;Short term: Daily weights obtained and reported for increase. Utilizing diuretic protocols set by physician.;Long term: Adoption of self-care skills and reduction of barriers for early signs and symptoms recognition and intervention leading to self-care maintenance.   Hypertension Yes   Intervention Provide education on lifestyle modifcations including regular physical activity/exercise, weight management, moderate  sodium restriction and increased consumption of fresh fruit, vegetables, and low fat dairy, alcohol moderation, and smoking cessation.;Monitor prescription use compliance.   Expected Outcomes Short Term: Continued assessment and intervention until BP is < 140/984mHG in hypertensive participants. < 130/8056mG in hypertensive participants with diabetes, heart failure or chronic kidney disease.;Long Term: Maintenance of blood pressure at goal levels.   Lipids Yes   Intervention Provide education  and support for participant on nutrition & aerobic/resistive exercise along with prescribed medications to achieve LDL <57m, HDL >429m   Expected Outcomes Long Term: Cholesterol controlled with medications as prescribed, with individualized exercise RX and with personalized nutrition plan. Value goals: LDL < 7035mHDL > 40 mg.;Short Term: Participant states understanding of desired cholesterol values and is compliant with medications prescribed. Participant is following exercise prescription and nutrition guidelines.   Stress Yes   Intervention Offer individual and/or small group education and counseling on adjustment to heart disease, stress management and health-related lifestyle change. Teach and support self-help strategies.;Refer participants experiencing significant psychosocial distress to appropriate mental health specialists for further evaluation and treatment. When possible, include family members and significant others in education/counseling sessions.   Expected Outcomes Long Term: Emotional wellbeing is indicated by absence of clinically significant psychosocial distress or social isolation.;Short Term: Participant demonstrates changes in health-related behavior, relaxation and other stress management skills, ability to obtain effective social support, and compliance with psychotropic medications if prescribed.      Core Components/Risk Factors/Patient Goals Review:    Core Components/Risk  Factors/Patient Goals at Discharge (Final Review):    ITP Comments:     ITP Comments    Row Name 11/18/16 1422 11/23/16 1029         ITP Comments Called [name] to discuss having him return to Cardiac Rehab.  He would like to come to the Tues/Thurs 8 am class.  He will be restarting on Feb 20.  He will have a new referral and start on visit number 5 to complete his rehab for heart failure. [name] returned to rehab today.  ITP resumed with new addition of LVAD to his CHF diagnosis.  [facility]         Comments: Restart Initial ITP

## 2016-11-23 NOTE — Progress Notes (Signed)
Daily Session Note  Patient Details  Name: Ronald Miller. MRN: [id number] Date of Birth: 1956-05-18 Referring Provider:   Flowsheet Row Cardiac Rehab from 11/23/2016 in [facility] Cardiac [facility] Pul[facility] Rehab  Referring Provider  Glori Bickers MD      Encounter Date: 11/23/2016  Check In:     Session Check In - 11/23/16 0843      Check-In   Location [facility]-Cardiac & Pulmonary Rehab   Staff Present Gerlene Burdock, RN, Levie Heritage, MA, ACSM RCEP, Exercise Physiologist;Amanda Oletta Darter, BA, ACSM CEP, Exercise Physiologist   Supervising physician immediately available to respond to emergencies See telemetry face sheet for immediately available ER MD   Medication changes reported     No   Fall or balance concerns reported    No   Warm-up and Cool-down Performed on first and last piece of equipment   Resistance Training Performed Yes   VAD Patient? Yes     VAD patient   Has back up controller? Yes   Has spare charged batteries? Yes   Has battery cables? Yes   Has compatible battery clips? Yes     Pain Assessment   Currently in Pain? No/denies   Multiple Pain Sites No           Exercise Prescription Changes - 11/23/16 1000      Exercise Review   Progression --  walk test results     Response to Exercise   Blood Pressure (Admit) --  92 dopplar   Blood Pressure (Exercise) --  90 dopplar   Heart Rate (Admit) 61 bpm   Heart Rate (Exercise) 100 bpm   Oxygen Saturation (Admit) 94 %   Rating of Perceived Exertion (Exercise) 15   Perceived Dyspnea (Exercise) 3   Symptoms SOB      Goals Met:  Proper associated with RPD/PD & O2 Sat Independence with exercise equipment Exercise tolerated well No report of cardiac concerns or symptoms Strength training completed today  Goals Unmet:  Not Applicable  Comments: Pt able to follow exercise prescription today without complaint.  Will continue to monitor for progression.  Dr. Emily Filbert is Medical Director for  [place] and [facility].

## 2016-11-23 NOTE — Progress Notes (Signed)
   11/23/16 1030  6 Minute Walk  Phase Initial  Distance 755 feet  Walk Time 4 minutes  # of Rest Breaks 1 (30 sec)  MPH 2.14  METS 1.88  RPE 15  Perceived Dyspnea  3  VO2 Peak 6.6  Symptoms Yes (comment)  Comments SOB  Resting HR 61 bpm  Resting BP (92 dopplar)  Max Ex. HR 100 bpm  Max Ex. BP (90 dopplar)  [name]'s initial walk test results with LVAD

## 2016-11-25 ENCOUNTER — Ambulatory Visit (HOSPITAL_COMMUNITY)
Admission: RE | Admit: 2016-11-25 | Discharge: 2016-11-25 | Disposition: A | Discharge status: Home / Self Care | Payer: Medicare HMO | Source: Ambulatory Visit | Attending: Cardiology | Admitting: Cardiology

## 2016-11-25 ENCOUNTER — Ambulatory Visit (HOSPITAL_COMMUNITY): Payer: Self-pay | Admitting: Pharmacist

## 2016-11-25 ENCOUNTER — Encounter: Payer: Medicare HMO | Admitting: *Deleted

## 2016-11-25 DIAGNOSIS — Z95811 Presence of heart assist device: Secondary | ICD-10-CM

## 2016-11-25 DIAGNOSIS — I5022 Chronic systolic (congestive) heart failure: Secondary | ICD-10-CM | POA: Diagnosis not present

## 2016-11-25 LAB — GLUCOSE, CAPILLARY
GLUCOSE-CAPILLARY: 189 mg/dL — AB (ref 65–99)
Glucose-Capillary: 170 mg/dL — ABNORMAL HIGH (ref 65–99)

## 2016-11-25 LAB — PROTIME-INR
INR: 1.28
PROTHROMBIN TIME: 16 s — AB (ref 11.4–15.2)

## 2016-11-25 NOTE — Progress Notes (Signed)
Daily Session Note  Patient Details  Name: Ronald Miller. MRN: [id number] Date of Birth: [birth year] Referring Provider:   Flowsheet Row Cardiac Rehab from 11/23/2016 in [facility] Cardiac and Pulmonary Rehab  Referring Provider  Glori Bickers MD      Encounter Date: 11/25/2016  Check In:     Session Check In - 11/25/16 1026      Check-In   Location [facility]-Cardiac & Pulmonary Rehab   Staff Present Alberteen Sam, MA, ACSM RCEP, Exercise Physiologist;Other;Amanda Oletta Darter, BA, ACSM CEP, Exercise Physiologist  Darel Hong, RN   Supervising physician immediately available to respond to emergencies See telemetry face sheet for immediately available ER MD   Medication changes reported     No   Fall or balance concerns reported    No   Warm-up and Cool-down Performed on first and last piece of equipment   Resistance Training Performed Yes   VAD Patient? Yes     VAD patient   Has back up controller? Yes   Has spare charged batteries? Yes   Has battery cables? Yes   Has compatible battery clips? Yes     Pain Assessment   Currently in Pain? No/denies   Multiple Pain Sites No         Goals Met:  Exercise tolerated well No report of cardiac concerns or symptoms Strength training completed today  Goals Unmet:  Dizzy and lightheaded on Treadmill.  Dopplar BP was 96 which was up from 90 at check in and 86 after the NuStep.  He was given some water and rested.  He also complained SOB where he felt like he just could not take a deep breath and had some gas trapped (burping some).  He was on his way to see Dr. Haroldine Laws for a follow up appt today.  Comments: First full day of exercise!  Patient was oriented to gym and equipment including functions, settings, policies, and procedures.  Patient's individual exercise prescription and treatment plan were reviewed.  All starting workloads were established based on the results of the 6 minute walk test done at initial orientation visit.   The plan for exercise progression was also introduced and progression will be customized based on patient's performance and goals.    Dr. Emily Filbert is Medical Director for [place] and [facility].

## 2016-11-30 ENCOUNTER — Other Ambulatory Visit (HOSPITAL_COMMUNITY): Payer: Self-pay

## 2016-11-30 ENCOUNTER — Other Ambulatory Visit (HOSPITAL_COMMUNITY): Payer: Self-pay | Admitting: Infectious Diseases

## 2016-11-30 ENCOUNTER — Encounter: Payer: Medicare HMO | Admitting: Respiratory Therapy

## 2016-11-30 ENCOUNTER — Ambulatory Visit (HOSPITAL_COMMUNITY)
Admission: RE | Admit: 2016-11-30 | Discharge: 2016-11-30 | Disposition: A | Discharge status: Home / Self Care | Payer: Medicare HMO | Source: Ambulatory Visit | Attending: Cardiology | Admitting: Cardiology

## 2016-11-30 ENCOUNTER — Inpatient Hospital Stay (HOSPITAL_COMMUNITY)
Admission: AD | Admit: 2016-11-30 | Discharge: 2016-12-06 | DRG: 292 | Disposition: A | Discharge status: Home Health | Payer: Medicare HMO | Source: Ambulatory Visit | Attending: Internal Medicine | Admitting: Internal Medicine

## 2016-11-30 ENCOUNTER — Encounter (HOSPITAL_COMMUNITY): Payer: Self-pay | Admitting: Infectious Diseases

## 2016-11-30 ENCOUNTER — Other Ambulatory Visit (HOSPITAL_COMMUNITY): Payer: Self-pay | Admitting: *Deleted

## 2016-11-30 ENCOUNTER — Inpatient Hospital Stay (HOSPITAL_COMMUNITY): Payer: Medicare HMO

## 2016-11-30 DIAGNOSIS — I5023 Acute on chronic systolic (congestive) heart failure: Secondary | ICD-10-CM | POA: Diagnosis present

## 2016-11-30 DIAGNOSIS — Z95811 Presence of heart assist device: Secondary | ICD-10-CM

## 2016-11-30 DIAGNOSIS — R0600 Dyspnea, unspecified: Secondary | ICD-10-CM

## 2016-11-30 DIAGNOSIS — Z794 Long term (current) use of insulin: Secondary | ICD-10-CM | POA: Diagnosis not present

## 2016-11-30 DIAGNOSIS — E119 Type 2 diabetes mellitus without complications: Secondary | ICD-10-CM | POA: Diagnosis present

## 2016-11-30 DIAGNOSIS — I5022 Chronic systolic (congestive) heart failure: Secondary | ICD-10-CM | POA: Insufficient documentation

## 2016-11-30 DIAGNOSIS — Z452 Encounter for adjustment and management of vascular access device: Secondary | ICD-10-CM

## 2016-11-30 DIAGNOSIS — R791 Abnormal coagulation profile: Secondary | ICD-10-CM | POA: Diagnosis present

## 2016-11-30 DIAGNOSIS — I509 Heart failure, unspecified: Secondary | ICD-10-CM | POA: Diagnosis not present

## 2016-11-30 DIAGNOSIS — I110 Hypertensive heart disease with heart failure: Secondary | ICD-10-CM | POA: Diagnosis present

## 2016-11-30 DIAGNOSIS — I2510 Atherosclerotic heart disease of native coronary artery without angina pectoris: Secondary | ICD-10-CM | POA: Diagnosis present

## 2016-11-30 DIAGNOSIS — Z7982 Long term (current) use of aspirin: Secondary | ICD-10-CM

## 2016-11-30 DIAGNOSIS — I480 Paroxysmal atrial fibrillation: Secondary | ICD-10-CM | POA: Diagnosis present

## 2016-11-30 DIAGNOSIS — R140 Abdominal distension (gaseous): Secondary | ICD-10-CM

## 2016-11-30 DIAGNOSIS — Z87891 Personal history of nicotine dependence: Secondary | ICD-10-CM | POA: Diagnosis not present

## 2016-11-30 DIAGNOSIS — F329 Major depressive disorder, single episode, unspecified: Secondary | ICD-10-CM | POA: Diagnosis present

## 2016-11-30 DIAGNOSIS — R188 Other ascites: Secondary | ICD-10-CM

## 2016-11-30 DIAGNOSIS — Z7901 Long term (current) use of anticoagulants: Secondary | ICD-10-CM

## 2016-11-30 DIAGNOSIS — Z95810 Presence of automatic (implantable) cardiac defibrillator: Secondary | ICD-10-CM

## 2016-11-30 DIAGNOSIS — Z79899 Other long term (current) drug therapy: Secondary | ICD-10-CM

## 2016-11-30 DIAGNOSIS — J449 Chronic obstructive pulmonary disease, unspecified: Secondary | ICD-10-CM | POA: Diagnosis present

## 2016-11-30 DIAGNOSIS — Z951 Presence of aortocoronary bypass graft: Secondary | ICD-10-CM

## 2016-11-30 LAB — COMPREHENSIVE METABOLIC PANEL
ALT: 17 U/L (ref 17–63)
ANION GAP: 8 (ref 5–15)
AST: 23 U/L (ref 15–41)
Albumin: 3.4 g/dL — ABNORMAL LOW (ref 3.5–5.0)
Alkaline Phosphatase: 92 U/L (ref 38–126)
BUN: 15 mg/dL (ref 6–20)
CHLORIDE: 94 mmol/L — AB (ref 101–111)
CO2: 35 mmol/L — ABNORMAL HIGH (ref 22–32)
Calcium: 8.6 mg/dL — ABNORMAL LOW (ref 8.9–10.3)
Creatinine, Ser: 1.06 mg/dL (ref 0.61–1.24)
Glucose, Bld: 134 mg/dL — ABNORMAL HIGH (ref 65–99)
POTASSIUM: 3.5 mmol/L (ref 3.5–5.1)
Sodium: 137 mmol/L (ref 135–145)
TOTAL PROTEIN: 7.1 g/dL (ref 6.5–8.1)
Total Bilirubin: 0.4 mg/dL (ref 0.3–1.2)

## 2016-11-30 LAB — MRSA PCR SCREENING: MRSA BY PCR: NEGATIVE

## 2016-11-30 LAB — PROTIME-INR
INR: 1.23
Prothrombin Time: 15.6 seconds — ABNORMAL HIGH (ref 11.4–15.2)

## 2016-11-30 LAB — CBC
HCT: 30.9 % — ABNORMAL LOW (ref 39.0–52.0)
Hemoglobin: 9.1 g/dL — ABNORMAL LOW (ref 13.0–17.0)
MCH: 25.2 pg — ABNORMAL LOW (ref 26.0–34.0)
MCHC: 29.4 g/dL — AB (ref 30.0–36.0)
MCV: 85.6 fL (ref 78.0–100.0)
PLATELETS: 175 10*3/uL (ref 150–400)
RBC: 3.61 MIL/uL — ABNORMAL LOW (ref 4.22–5.81)
RDW: 15.8 % — AB (ref 11.5–15.5)
WBC: 7.4 10*3/uL (ref 4.0–10.5)

## 2016-11-30 LAB — BRAIN NATRIURETIC PEPTIDE: B Natriuretic Peptide: 565.5 pg/mL — ABNORMAL HIGH (ref 0.0–100.0)

## 2016-11-30 LAB — LACTATE DEHYDROGENASE: LDH: 198 U/L — ABNORMAL HIGH (ref 98–192)

## 2016-11-30 LAB — GLUCOSE, CAPILLARY: GLUCOSE-CAPILLARY: 213 mg/dL — AB (ref 65–99)

## 2016-11-30 MED ORDER — PANTOPRAZOLE SODIUM 40 MG PO TBEC
40.0000 mg | DELAYED_RELEASE_TABLET | Freq: Every day | ORAL | Status: DC
  Administered 2016-12-01 – 2016-12-06 (×6): 40 mg via ORAL
  Filled 2016-11-30 (×7): qty 1

## 2016-11-30 MED ORDER — SPIRONOLACTONE 25 MG PO TABS
25.0000 mg | ORAL_TABLET | Freq: Every day | ORAL | Status: DC
  Administered 2016-12-01 – 2016-12-06 (×6): 25 mg via ORAL
  Filled 2016-11-30 (×6): qty 1

## 2016-11-30 MED ORDER — SODIUM CHLORIDE 0.9% FLUSH
10.0000 mL | Freq: Two times a day (BID) | INTRAVENOUS | Status: DC
  Administered 2016-11-30 – 2016-12-05 (×8): 10 mL

## 2016-11-30 MED ORDER — GABAPENTIN 100 MG PO CAPS
200.0000 mg | ORAL_CAPSULE | Freq: Two times a day (BID) | ORAL | Status: DC
  Administered 2016-11-30 – 2016-12-06 (×12): 200 mg via ORAL
  Filled 2016-11-30 (×12): qty 2

## 2016-11-30 MED ORDER — ASPIRIN EC 81 MG PO TBEC
81.0000 mg | DELAYED_RELEASE_TABLET | Freq: Every day | ORAL | Status: DC
  Administered 2016-12-01 – 2016-12-06 (×6): 81 mg via ORAL
  Filled 2016-11-30 (×6): qty 1

## 2016-11-30 MED ORDER — ACETAMINOPHEN 325 MG PO TABS
650.0000 mg | ORAL_TABLET | ORAL | Status: DC | PRN
  Administered 2016-11-30 – 2016-12-01 (×2): 650 mg via ORAL
  Filled 2016-11-30 (×2): qty 2

## 2016-11-30 MED ORDER — TRAMADOL HCL 50 MG PO TABS
50.0000 mg | ORAL_TABLET | Freq: Two times a day (BID) | ORAL | Status: DC | PRN
  Administered 2016-12-01 – 2016-12-05 (×3): 50 mg via ORAL
  Filled 2016-11-30 (×3): qty 1

## 2016-11-30 MED ORDER — DM-GUAIFENESIN ER 30-600 MG PO TB12
1.0000 | ORAL_TABLET | Freq: Two times a day (BID) | ORAL | Status: DC
  Administered 2016-11-30 – 2016-12-06 (×12): 1 via ORAL
  Filled 2016-11-30 (×12): qty 1

## 2016-11-30 MED ORDER — CITALOPRAM HYDROBROMIDE 20 MG PO TABS
10.0000 mg | ORAL_TABLET | Freq: Every day | ORAL | Status: DC
  Administered 2016-12-01 – 2016-12-06 (×6): 10 mg via ORAL
  Filled 2016-11-30 (×6): qty 1

## 2016-11-30 MED ORDER — LOSARTAN POTASSIUM 25 MG PO TABS
25.0000 mg | ORAL_TABLET | Freq: Every day | ORAL | Status: DC
  Administered 2016-12-01 – 2016-12-06 (×6): 25 mg via ORAL
  Filled 2016-11-30 (×6): qty 1

## 2016-11-30 MED ORDER — MAGNESIUM OXIDE 400 (241.3 MG) MG PO TABS
400.0000 mg | ORAL_TABLET | Freq: Every day | ORAL | Status: DC
  Administered 2016-12-01 – 2016-12-06 (×6): 400 mg via ORAL
  Filled 2016-11-30 (×6): qty 1

## 2016-11-30 MED ORDER — POTASSIUM CHLORIDE CRYS ER 20 MEQ PO TBCR
20.0000 meq | EXTENDED_RELEASE_TABLET | Freq: Every day | ORAL | Status: DC
  Filled 2016-11-30: qty 1

## 2016-11-30 MED ORDER — ALBUTEROL SULFATE (2.5 MG/3ML) 0.083% IN NEBU: 2.5000 mg | INHALATION_SOLUTION | Freq: Four times a day (QID) | RESPIRATORY_TRACT | Status: DC | PRN

## 2016-11-30 MED ORDER — ATORVASTATIN CALCIUM 20 MG PO TABS
20.0000 mg | ORAL_TABLET | Freq: Every day | ORAL | Status: DC
  Administered 2016-11-30 – 2016-12-05 (×6): 20 mg via ORAL
  Filled 2016-11-30 (×6): qty 1

## 2016-11-30 MED ORDER — WARFARIN - PHARMACIST DOSING INPATIENT: Freq: Every day | Status: DC

## 2016-11-30 MED ORDER — INSULIN DETEMIR 100 UNIT/ML ~~LOC~~ SOLN
10.0000 [IU] | Freq: Two times a day (BID) | SUBCUTANEOUS | Status: DC
  Administered 2016-11-30 – 2016-12-06 (×12): 10 [IU] via SUBCUTANEOUS
  Filled 2016-11-30 (×13): qty 0.1

## 2016-11-30 MED ORDER — DOCUSATE SODIUM 100 MG PO CAPS
200.0000 mg | ORAL_CAPSULE | Freq: Every day | ORAL | Status: DC
  Administered 2016-12-01: 100 mg via ORAL
  Administered 2016-12-02 – 2016-12-06 (×5): 200 mg via ORAL
  Filled 2016-11-30 (×6): qty 2

## 2016-11-30 MED ORDER — ACETAMINOPHEN 500 MG PO TABS: 1000.0000 mg | ORAL_TABLET | Freq: Four times a day (QID) | ORAL | Status: DC | PRN

## 2016-11-30 MED ORDER — INSULIN ASPART 100 UNIT/ML ~~LOC~~ SOLN
10.0000 [IU] | Freq: Three times a day (TID) | SUBCUTANEOUS | Status: DC
  Administered 2016-12-01 – 2016-12-06 (×16): 10 [IU] via SUBCUTANEOUS

## 2016-11-30 MED ORDER — WARFARIN SODIUM 2.5 MG PO TABS
12.5000 mg | ORAL_TABLET | Freq: Once | ORAL | Status: AC
  Administered 2016-11-30: 18:00:00 12.5 mg via ORAL
  Filled 2016-11-30: qty 1

## 2016-11-30 MED ORDER — ESCITALOPRAM OXALATE 10 MG PO TABS: 20.0000 mg | ORAL_TABLET | Freq: Every day | ORAL | Status: DC

## 2016-11-30 MED ORDER — SODIUM CHLORIDE 0.9% FLUSH: 10.0000 mL | INTRAVENOUS | Status: DC | PRN

## 2016-11-30 MED ORDER — FUROSEMIDE 10 MG/ML IJ SOLN
80.0000 mg | Freq: Two times a day (BID) | INTRAMUSCULAR | Status: DC
  Administered 2016-11-30: 80 mg via INTRAVENOUS
  Filled 2016-11-30: qty 8

## 2016-11-30 MED ORDER — ADULT MULTIVITAMIN W/MINERALS CH
1.0000 | ORAL_TABLET | Freq: Every day | ORAL | Status: DC
  Administered 2016-12-01 – 2016-12-06 (×6): 1 via ORAL
  Filled 2016-11-30 (×6): qty 1

## 2016-11-30 MED ORDER — BACLOFEN 10 MG PO TABS
10.0000 mg | ORAL_TABLET | Freq: Three times a day (TID) | ORAL | Status: DC | PRN
  Filled 2016-11-30: qty 1

## 2016-11-30 MED ORDER — ONDANSETRON HCL 4 MG/2ML IJ SOLN: 4.0000 mg | Freq: Four times a day (QID) | INTRAMUSCULAR | Status: DC | PRN

## 2016-11-30 MED ORDER — AMIODARONE HCL 200 MG PO TABS
200.0000 mg | ORAL_TABLET | Freq: Every day | ORAL | Status: DC
  Administered 2016-12-01 – 2016-12-06 (×6): 200 mg via ORAL
  Filled 2016-11-30 (×6): qty 1

## 2016-11-30 NOTE — Progress Notes (Signed)
CSW met with patient in the clinic. Patient reports feeling down and frustrated as "I thought I would be feeling better by now". Patient spoke of difficulties with walking due to SOB and sleeping at night as "I wake up multiples times during the night". CSW and HF Navigator discussed time for recovery and adjustments to VAD life. Patient also mentioned that he felt "maybe only have 5 years after attending the support group". Patient appears to be struggling with his own mortality and due to continued feelings and struggles with HF symptoms is getting frustrated and down. Patient continues to deny any substance abuse and is open to counseling but due to continued health issues it has been difficult to arrange. Patient appears open to continued sessions with this CSW at the moment. CSW will follow up with referral for outpatient counseling once patient feeling more improved. Raquel Sarna, [place], [place]

## 2016-11-30 NOTE — Addendum Note (Signed)
Encounter addended by: Louann Liv, LCSW on: 11/30/2016  1:31 PM<BR>    Actions taken: Sign clinical note

## 2016-11-30 NOTE — Progress Notes (Signed)
ANTICOAGULATION CONSULT NOTE - Initial Consult  Pharmacy Consult for Coumadin Indication: LVAD  Allergies  Allergen Reactions  . Codeine Nausea And Vomiting  . Lipitor [Atorvastatin] Nausea Only    Nausea with high doses, tolerates 20mg  dose (08/22/16)    Patient Measurements: Height: 5\' 10"  (177.8 cm) Weight: 241 lb (109.3 kg) IBW/kg (Calculated) : 73   Vital Signs: Temp: 98.8 F (37.1 C) (02/27 1413) Temp Source: Oral (02/27 1413) BP: 98/58 (02/27 1413) Pulse Rate: 77 (02/27 1413)  Labs:  Recent Labs  11/30/16 1044 11/30/16 1158  HGB  --  9.1*  HCT  --  30.9*  PLT  --  175  LABPROT 15.6*  --   INR 1.23  --   CREATININE  --  1.06    Estimated Creatinine Clearance: 91.7 mL/min (by C-G formula based on SCr of 1.06 mg/dL).   Medical History: Past Medical History:  Diagnosis Date  . AICD (automatic cardioverter/defibrillator) present   . ASCVD (arteriosclerotic cardiovascular disease)   . Chronic systolic CHF (congestive heart failure) ([place])   . COPD (chronic obstructive pulmonary disease) ([place])   . Coronary artery disease   . Depression   . Diabetes mellitus   . History of cocaine abuse   . Hypertension   . Presence of permanent cardiac pacemaker   . Shortness of breath dyspnea   . Suicidal ideation   . Tobacco abuse     Assessment: 69yom s/p LVAD 09/09/16 on coumadin being admitted with marked volume overload and RV failure. INR on admit is subtherapeutic at 1.23. He was seen in LVAD clinic on 2/22 - INR at that time 1.28 and per discussion with Dr. Haroldine Laws no bridge needed due to history of severe nosebleed. Hgb 9.1, LDH 198.  Home dose: 10mg  daily except 7.5mg  Tue/Thu/Sat  Goal of Therapy:  INR 2-2.5 Monitor platelets by anticoagulation protocol: Yes   Plan:  1) Coumadin 12.5mg  x 1 2) Daily INR  Deboraha Sprang 11/30/2016,2:21 PM

## 2016-11-30 NOTE — Progress Notes (Signed)
Peripherally Inserted Central Catheter/Midline Placement  The IV Nurse has discussed with the patient and/or persons authorized to consent for the patient, the purpose of this procedure and the potential benefits and risks involved with this procedure.  The benefits include less needle sticks, lab draws from the catheter, and the patient may be discharged home with the catheter. Risks include, but not limited to, infection, bleeding, blood clot (thrombus formation), and puncture of an artery; nerve damage and irregular heartbeat and possibility to perform a PICC exchange if needed/ordered by physician.  Alternatives to this procedure were also discussed.  Bard Power PICC patient education guide, fact sheet on infection prevention and patient information card has been provided to patient /or left at bedside.    PICC/Midline Placement Documentation        Lanell Dubie, Nicolette Bang 11/30/2016, 4:45 PM

## 2016-11-30 NOTE — Care Management Note (Addendum)
Case Management Note  Patient Details  Name: Ronald Miller. MRN: [id number] Date of Birth: Dec 20, 1955  Subjective/Objective:   Pt admitted with volume overload - s/p LVAD 12/17                 Action/Plan:  Pt alert and oriented during assessment.   PTA from home independent with sister.  Pt denied barriers to returning home.  CM will continue to follow for discharge needs   Expected Discharge Date:                  Expected Discharge Plan:     In-House Referral:     Discharge planning Services  CM Consult  Post Acute Care Choice:    Choice offered to:     DME Arranged:    DME Agency:     HH Arranged:    HH Agency:     Status of Service:  In process, will continue to follow  If discussed at Long Length of Stay Meetings, dates discussed:    Additional Comments:  Maryclare Labrador, RN 11/30/2016, 3:13 PM

## 2016-11-30 NOTE — Progress Notes (Signed)
Patient presents acutely in [facility] today. Reports no problems with VAD equipment or concerns with drive line.   Vital Signs:  Doppler Pressure: 92  Weight: 241 lb w/ eqt   VAD Indication: Destination Therapy - Evaluation completed at [facility] (halted with nicotine/SA).   VAD interrogation & Equipment Management: Speed: 5900 Flow: 5.4 Power: 4.6 w    PI: 2.6  Alarms: no clinical alarms  Events:10 - 20 events daily   Fixed speed: 5900 Low speed limit: 5600  Primary Controller:  Replace back up battery in 28 months. Back up controller:   Replace back up battery in 28 months.  Annual Equipment Maintenance on UBC/PM was performed on December 2017.   I reviewed the LVAD parameters from today and compared the results to the patient's prior recorded data with Dr. Haroldine Laws. LVAD interrogation was NEGATIVE for significant power changes, NEGATIVE for clinical alarms and STABLE for PI events/speed drops. No programming changes were made and pump is functioning within specified parameters. Pt is performing daily controller and system monitor self tests along with completing weekly and monthly maintenance for LVAD equipment.  LVAD equipment check completed and is in good working order. Back-up equipment present.  Exit Site Care: Drive line is being maintained weekly by his sister and mother. Drive line exit site well healed and incorporated. The velour is fully implanted at exit site. Dressing dry and intact. No erythema or drainage. Stabilization device present and accurately applied. Pt denies fever or chills. Next dressing change is due tomorrow.   Significant Events on VAD Support:  none   Patient being admitted today to Dr. Clayborne Dana HF service.   [name], RN VAD Coordinator   Office: [phone] 24/7 Emergency VAD Pager: [phone]

## 2016-11-30 NOTE — H&P (Signed)
VAD TEAM History & Physical Note   Reason for Admission: A/C Systolic Heart Failure.    HPI:    Ronald Milleris a 61 y.o.male with history of chronic systolic HF s/p Med[employer] ICD 2014, CAD s/p CABG in 2000, HTN, Hx of cocaine abuse, Tobacco abuse, Depression, and COPD.   Underwent HM 3 placement 09/09/16. Post-op course complicated by RV failure, volume overload, left pleural effusion and PAF. He was diuresed successfully. Milrinone weaned to off. Underwent ramp echo prior to d/c and speed adjusted. Also underwent left thoracentesis with IR (600 cc off) with moderate improvement in size of effusion. Warfarin loaded. INR 2.0. Co-ox on date of d/c was 52% but he was ambulating well without symptoms. PICC line pulled prior to discharge.   In 10/13/16 had repeat thoracentesis.   Today he presented to LVAD clinic with volume overload. SOB with exertion, + orthopnea, and complaining of fatigue. Weight at home up 240 pounds. Appetite fair. No feve or chills. Taking all medications. No bleeding problems.   LVAD INTERROGATION: HMIII HeartMate II LVAD:  Flow 5.4 liters/min, speed 5900  , power 5.4, PI 2.6   Alarms: no clinical alarms Events:10 - 20 events daily  Fixed speed: 5900 Low speed limit: 5600  Review of Systems: [y] = yes, [ ]  = no   General: Weight gain [Y ]; Weight loss [ ] ; Anorexia [ ] ; Fatigue [Y ]; Fever [ ] ; Chills [ ] ; Weakness [ ]   Cardiac: Chest pain/pressure [ ] ; Resting SOB [ ] ; Exertional SOB [Y ]; Orthopnea [ Y]; Pedal Edema [ ] ; Palpitations [ ] ; Syncope [ ] ; Presyncope [ ] ; Paroxysmal nocturnal dyspnea[ ]   Pulmonary: Cough [ ] ; Wheezing[ ] ; Hemoptysis[ ] ; Sputum [ ] ; Snoring [ ]   GI: Vomiting[ ] ; Dysphagia[ ] ; Melena[ ] ; Hematochezia [ ] ; Heartburn[ ] ; Abdominal pain [ ] ; Constipation [ ] ; Diarrhea [ ] ; BRBPR [ ]   GU: Hematuria[ ] ; Dysuria [ ] ; Nocturia[ ]   Vascular: Pain in legs with walking [ ] ; Pain in feet with lying flat [ ] ; Non-healing sores [  ]; Stroke [ ] ; TIA [ ] ; Slurred speech [ ] ;  Neuro: Headaches[ ] ; Vertigo[ ] ; Seizures[ ] ; Paresthesias[ ] ;Blurred vision [ ] ; Diplopia [ ] ; Vision changes [ ]   Ortho/Skin: Arthritis [ ] ; Joint pain Y ]; Muscle pain [ ] ; Joint swelling [ ] ; Back Pain [ ] ; Rash [ ]   Psych: Depression[Y ]; Anxiety[ ]   Heme: Bleeding problems [ ] ; Clotting disorders [ ] ; Anemia [ ]   Endocrine: Diabetes [ ] ; Thyroid dysfunction[ ]   Home Medications Prior to Admission medications   Medication Sig Start Date End Date Taking? Authorizing Provider  acetaminophen (TYLENOL) 500 MG tablet Take 1,000 mg by mouth every 6 (six) hours as needed for mild pain or headache.    Historical Provider, MD  albuterol (PROVENTIL HFA;VENTOLIN HFA) 108 (90 BASE) MCG/ACT inhaler Inhale 2 puffs into the lungs every 6 (six) hours as needed for wheezing or shortness of breath.    Historical Provider, MD  amiodarone (PACERONE) 200 MG tablet Take 1 tablet (200 mg total) by mouth daily. 10/01/16   Larey Dresser, MD  amoxicillin-clavulanate (AUGMENTIN) 875-125 MG tablet Take 1 tablet by mouth 2 (two) times daily. Take while nasal packing is in place. STOP when packing removed. Patient not taking: Reported on 11/10/2016 10/28/16   Jolaine Artist, MD  aspirin EC 81 MG tablet Take 81 mg by mouth daily.  Historical Provider, MD  atorvastatin (LIPITOR) 20 MG tablet Take 20 mg by mouth at bedtime.     Historical Provider, MD  baclofen (LIORESAL) 10 MG tablet Take 10 mg by mouth 3 (three) times daily as needed for muscle spasms.    Historical Provider, MD  citalopram (CELEXA) 10 MG tablet Take 1 tablet (10 mg total) by mouth daily. 11/19/16   Jolaine Artist, MD  dextromethorphan-guaiFENesin [facility] DM) 30-600 MG 12hr tablet Take 1 tablet by mouth 2 (two) times daily. 09/25/16   Isaiah Serge, NP  docusate sodium (COLACE) 100 MG capsule Take 2 capsules (200 mg total) by mouth daily. 09/25/16   Isaiah Serge, NP  enoxaparin (LOVENOX) 100 MG/ML  injection Inject 1 mL (100 mg total) into the skin every 12 (twelve) hours. Patient not taking: Reported on 11/10/2016 10/26/16   Jolaine Artist, MD  escitalopram (LEXAPRO) 20 MG tablet Take 1 tablet (20 mg total) by mouth daily. 09/02/15   Max Sane, MD  gabapentin (NEURONTIN) 100 MG capsule Take 2 capsules (200 mg total) by mouth 2 (two) times daily. 10/07/16   Jolaine Artist, MD  insulin aspart (NOVOLOG FLEXPEN) 100 UNIT/ML FlexPen Inject 10 Units into the skin 3 (three) times daily with meals.    Historical Provider, MD  Insulin Detemir (LEVEMIR) 100 UNIT/ML Pen Inject 10 Units into the skin 2 (two) times daily at 8 am and 10 pm. 09/25/16   Isaiah Serge, NP  losartan (COZAAR) 25 MG tablet Take 1 tablet (25 mg total) by mouth daily. 10/01/16 12/30/16  Larey Dresser, MD  magnesium oxide (MAG-OX) 400 (241.3 Mg) MG tablet Take 1 tablet (400 mg total) by mouth daily. 09/25/16   Isaiah Serge, NP  Multiple Vitamin (MULTIVITAMIN WITH MINERALS) TABS tablet Take 1 tablet by mouth daily. Sentry/Centrum    Historical Provider, MD  Omega-3 Fatty Acids (FISH OIL) 1000 MG CAPS Take 1,000 mg by mouth daily with supper.     Historical Provider, MD  pantoprazole (PROTONIX) 40 MG tablet Take 1 tablet (40 mg total) by mouth daily. 09/25/16   Isaiah Serge, NP  potassium chloride 20 MEQ TBCR Take 20 mEq by mouth daily. 09/25/16   Isaiah Serge, NP  senna (SENOKOT) 8.6 MG TABS tablet Take 2 tablets (17.2 mg total) by mouth at bedtime as needed. Contact PCP for further refills, this is a courtesy refill Patient taking differently: Take 2 tablets by mouth at bedtime as needed (constipation).  08/06/16   [name] Friar, PA-C  spironolactone (ALDACTONE) 25 MG tablet Take 1 tablet (25 mg total) by mouth daily. 08/10/16   Jolaine Artist, MD  torsemide (DEMADEX) 20 MG tablet Take 2 tablets (40 mg total) by mouth daily. 11/19/16   Jolaine Artist, MD  traMADol (ULTRAM) 50 MG tablet Take 1 tablet (50 mg  total) by mouth every 12 (twelve) hours as needed for severe pain. 10/01/16   Larey Dresser, MD  warfarin (COUMADIN) 5 MG tablet Take 1.5 tablets (7.5 mg total) by mouth daily. And as prescribed on blood checks 09/25/16   Isaiah Serge, NP    Past Medical History: Past Medical History:  Diagnosis Date  . AICD (automatic cardioverter/defibrillator) present   . ASCVD (arteriosclerotic cardiovascular disease)   . Chronic systolic CHF (congestive heart failure) ([place])   . COPD (chronic obstructive pulmonary disease) ([place])   . Coronary artery disease   . Depression   . Diabetes mellitus   .  History of cocaine abuse   . Hypertension   . Presence of permanent cardiac pacemaker   . Shortness of breath dyspnea   . Suicidal ideation   . Tobacco abuse     Past Surgical History: Past Surgical History:  Procedure Laterality Date  . CARDIAC CATHETERIZATION N/A 02/12/2016   Procedure: Right/Left Heart Cath and Coronary/Graft Angiography;  Surgeon: Jolaine Artist, MD;  Location: [place] [facility];  Service: Cardiovascular;  Laterality: N/A;  . CARDIAC CATHETERIZATION N/A 03/22/2016   Procedure: Right Heart Cath;  Surgeon: Jolaine Artist, MD;  Location: [place] [facility];  Service: Cardiovascular;  Laterality: N/A;  . CARDIAC CATHETERIZATION N/A 08/25/2016   Procedure: Right Heart Cath;  Surgeon: Jolaine Artist, MD;  Location: [place] [facility];  Service: Cardiovascular;  Laterality: N/A;  . CARDIAC CATHETERIZATION N/A 09/03/2016   Procedure: Right Heart Cath;  Surgeon: Jolaine Artist, MD;  Location: [place] [facility];  Service: Cardiovascular;  Laterality: N/A;  . CARDIAC DEFIBRILLATOR PLACEMENT    . CARDIAC DEFIBRILLATOR PLACEMENT    . CHOLECYSTECTOMY N/A 08/30/2015   Procedure: LAPAROSCOPIC CHOLECYSTECTOMY;  Surgeon: Hubbard Robinson, MD;  Location: [name];  Service: General;  Laterality: N/A;  . COLONOSCOPY WITH PROPOFOL N/A 08/18/2016   Procedure: COLONOSCOPY WITH  PROPOFOL;  Surgeon: Jerene Bears, MD;  Location: [place];  Service: Gastroenterology;  Laterality: N/A;  . CORONARY ARTERY BYPASS GRAFT  2001  . INSERT / REPLACE / REMOVE PACEMAKER    . INSERTION OF IMPLANTABLE LEFT VENTRICULAR ASSIST DEVICE N/A 09/09/2016   Procedure: INSERTION OF IMPLANTABLE LEFT VENTRICULAR ASSIST DEVICE;  Surgeon: [name], MD;  Location: [place];  Service: Open Heart Surgery;  Laterality: N/A;  HeartMate 3 CIRC ARREST  NITRIC OXIDE  . IR GENERIC HISTORICAL  08/03/2016   IR FLUORO GUIDE CV LINE RIGHT 08/03/2016 [name], MD [facility]  . LUMBAR DISC SURGERY  02/1999   Discectomy and fusion  . TEE WITHOUT CARDIOVERSION N/A 09/09/2016   Procedure: TRANSESOPHAGEAL ECHOCARDIOGRAM (TEE);  Surgeon: [name], MD;  Location: [place];  Service: Open Heart Surgery;  Laterality: N/A;  . VASECTOMY     Subsequent reversal    Family History: Family History  Problem Relation Age of Onset  . CAD Father   . Diabetes Father   . Heart failure Father     Social History: Social History   Social History  . Marital status: Single    Spouse name: N/A  . Number of children: N/A  . Years of education: N/A   Occupational History  . Retired   . Truck driver    Social History Main Topics  . Smoking status: Former Smoker    Packs/day: 1.50    Years: 23.00    Types: Cigarettes    Quit date: 08/27/2015  . Smokeless tobacco: Never Used  . Alcohol use No  . Drug use: No     Comment: Former  . Sexual activity: Not Currently   Other Topics Concern  . Not on file   Social History Narrative   Divorced with one child   No regular exercise    Allergies:  Allergies  Allergen Reactions  . Codeine Nausea And Vomiting  . Lipitor [Atorvastatin] Nausea Only    Nausea with high doses, tolerates 20mg  dose (08/22/16)    Objective:    Vital Signs:       There were no vitals filed for this visit.  Mean arterial Pressure  Physical Exam: General:   Appears fatigue. SOB with walking.  HEENT: normal Neck: supple. JVP to ear. Carotids 2+ bilat; no bruits. No lymphadenopathy or thryomegaly appreciated. Cor: Mechanical heart sounds with LVAD hum present. Lungs: Decreased left Abdomen: soft, nontender, ++distended. No hepatosplenomegaly. No bruits or masses. Good bowel sounds. Driveline: C/D/I; securement device intact and driveline incorporated Extremities: no cyanosis, clubbing, rash, 1-2+ edema Neuro: alert & orientedx3, cranial nerves grossly intact. moves all 4 extremities w/o difficulty. Affect pleasant  Telemetry:  Labs: Basic Metabolic Panel:  Recent Labs Lab 11/30/16 1158  NA 137  K 3.5  CL 94*  CO2 35*  GLUCOSE 134*  BUN 15  CREATININE 1.06  CALCIUM 8.6*    Liver Function Tests:  Recent Labs Lab 11/30/16 1158  AST 23  ALT 17  ALKPHOS 92  BILITOT 0.4  PROT 7.1  ALBUMIN 3.4*   No results for input(s): LIPASE, AMYLASE in the last 168 hours. No results for input(s): AMMONIA in the last 168 hours.  CBC:  Recent Labs Lab 11/30/16 1158  WBC 7.4  HGB 9.1*  HCT 30.9*  MCV 85.6  PLT 175    Cardiac Enzymes: No results for input(s): CKTOTAL, CKMB, CKMBINDEX, TROPONINI in the last 168 hours.  BNP: BNP (last 3 results)  Recent Labs  10/01/16 1144 10/07/16 1352 10/13/16 1207  BNP 524.2* 387.6* 321.5*    ProBNP (last 3 results) No results for input(s): PROBNP in the last 8760 hours.   CBG:  Recent Labs Lab 11/25/16 0848 11/25/16 0952  GLUCAP 170* 189*    Coagulation Studies:  Recent Labs  11/30/16 1044  LABPROT 15.6*  INR 1.23    Other results: EKG:  Imaging:  No results found.      Assessment:  1. A/C End Stage Biventricular HF 2. 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 DMII 5. PAF 6. H/O L pleural effusion 7. On chronic anticoagulation - INR goal 2-2.5  8. Ascites s/p previous paracentesis   Plan/Discussion:   Ronald Ronald Miller is being admitted with  marked volume overload. Concern for low output and RV failure. Place PICC for CO-OX and CVP. +/-milrinone. Diurese with 80 mg IV lasix twice a day.   Check Ramp ECHO and adjust speed accordingly.   Check labs and ab u/s. May need paracentesis  I reviewed the LVAD parameters from today, and compared the results to the patient's prior recorded data.  No programming changes were made.  The LVAD is functioning within specified parameters.  The patient performs LVAD self-test daily.  LVAD interrogation was negative for any significant power changes, alarms or PI events/speed drops.  LVAD equipment check completed and is in good working order.  Back-up equipment present.   LVAD education done on emergency procedures and precautions and reviewed exit site care.  Length of Stay: 0  Amy Clegg 11/30/2016, 12:48 PM  VAD Team Pager [phone] (7am - 7am) +++VAD ISSUES ONLY+++   Advanced Heart Failure Team Pager [phone] (M-F; 7a - 4p)  Please contact [facility] for night-coverage after hours (4p -7a ) and weekends on [email or web address] for all non- LVAD Issues  Patient seen and examined with Darrick Grinder, NP. We discussed all aspects of the encounter. I agree with the assessment and plan as stated above.   Ronald Miller has recurrent R > L HF in setting of severe iCM and end-stage HF now s/p HM-II. He has significant volume overload on exam not responding to intensification  of his outpatient regimen. Will admit for IV diuresis, ramp echo and possible paracentesis.   VAD parameters currently stable.   Ronald Coviello,MD 2:29 PM

## 2016-12-01 ENCOUNTER — Encounter: Payer: Self-pay | Admitting: *Deleted

## 2016-12-01 ENCOUNTER — Inpatient Hospital Stay (HOSPITAL_COMMUNITY): Payer: Medicare HMO

## 2016-12-01 DIAGNOSIS — I5022 Chronic systolic (congestive) heart failure: Secondary | ICD-10-CM

## 2016-12-01 DIAGNOSIS — I5023 Acute on chronic systolic (congestive) heart failure: Secondary | ICD-10-CM

## 2016-12-01 DIAGNOSIS — Z95811 Presence of heart assist device: Secondary | ICD-10-CM

## 2016-12-01 LAB — ECHOCARDIOGRAM LIMITED
HEIGHTINCHES: 70 in
WEIGHTICAEL: 3696 [oz_av]

## 2016-12-01 LAB — BASIC METABOLIC PANEL
ANION GAP: 10 (ref 5–15)
BUN: 18 mg/dL (ref 6–20)
CHLORIDE: 92 mmol/L — AB (ref 101–111)
CO2: 36 mmol/L — AB (ref 22–32)
Calcium: 8.5 mg/dL — ABNORMAL LOW (ref 8.9–10.3)
Creatinine, Ser: 1.04 mg/dL (ref 0.61–1.24)
GFR calc non Af Amer: >60 mL/min (ref >60)
Glucose, Bld: 213 mg/dL — ABNORMAL HIGH (ref 65–99)
POTASSIUM: 3.6 mmol/L (ref 3.5–5.1)
SODIUM: 138 mmol/L (ref 135–145)

## 2016-12-01 LAB — GLUCOSE, CAPILLARY
GLUCOSE-CAPILLARY: 211 mg/dL — AB (ref 65–99)
GLUCOSE-CAPILLARY: 170 mg/dL — AB (ref 65–99)
Glucose-Capillary: 218 mg/dL — ABNORMAL HIGH (ref 65–99)
Glucose-Capillary: 160 mg/dL — ABNORMAL HIGH (ref 65–99)

## 2016-12-01 LAB — CBC
HEMATOCRIT: 30.9 % — AB (ref 39.0–52.0)
HEMOGLOBIN: 9.2 g/dL — AB (ref 13.0–17.0)
MCH: 25.2 pg — ABNORMAL LOW (ref 26.0–34.0)
MCHC: 29.8 g/dL — ABNORMAL LOW (ref 30.0–36.0)
MCV: 84.7 fL (ref 78.0–100.0)
Platelets: 178 10*3/uL (ref 150–400)
RBC: 3.65 MIL/uL — ABNORMAL LOW (ref 4.22–5.81)
RDW: 15.7 % — ABNORMAL HIGH (ref 11.5–15.5)
WBC: 6.4 10*3/uL (ref 4.0–10.5)

## 2016-12-01 LAB — COOXEMETRY PANEL
Carboxyhemoglobin: 1.2 % (ref 0.5–1.5)
Methemoglobin: 0.6 % (ref 0.0–1.5)
O2 SAT: 50 %
TOTAL HEMOGLOBIN: 8.6 g/dL — AB (ref 12.0–16.0)

## 2016-12-01 LAB — PROTIME-INR
INR: 1.23
Prothrombin Time: 15.6 seconds — ABNORMAL HIGH (ref 11.4–15.2)

## 2016-12-01 LAB — LACTATE DEHYDROGENASE: LDH: 170 U/L (ref 98–192)

## 2016-12-01 MED ORDER — POTASSIUM CHLORIDE CRYS ER 20 MEQ PO TBCR
20.0000 meq | EXTENDED_RELEASE_TABLET | Freq: Two times a day (BID) | ORAL | Status: DC
  Administered 2016-12-01 – 2016-12-06 (×11): 20 meq via ORAL
  Filled 2016-12-01 (×11): qty 1

## 2016-12-01 MED ORDER — WARFARIN SODIUM 2.5 MG PO TABS
12.5000 mg | ORAL_TABLET | Freq: Once | ORAL | Status: AC
  Administered 2016-12-01: 12.5 mg via ORAL
  Filled 2016-12-01: qty 1

## 2016-12-01 MED ORDER — FUROSEMIDE 10 MG/ML IJ SOLN
80.0000 mg | Freq: Three times a day (TID) | INTRAMUSCULAR | Status: DC
  Administered 2016-12-01 (×3): 80 mg via INTRAVENOUS
  Filled 2016-12-01 (×3): qty 8

## 2016-12-01 NOTE — Progress Notes (Signed)
Advanced Heart Failure VAD Team HM-3 Note  Subjective:    Admitted from clinic 11/30/16 with worsening fatigue and DOE. Weight up at least 15 lbs.  PICC placed for Coox and CVP with RV failure post VAD.     Coox 50% this am and CVP 20. Out 1.4 L. Weight shows down 10 lbs but ? Accuracy.   Still feeling run down and uncomfortable this am.  Multiple complaints. No SOB at rest but DOE around room.  Depressed and anxious.  LVAD INTERROGATION:  HeartMate 3 LVAD:  Flow 5.4 liters/min, speed 5900, power 3, PI 4.6.  No alarms  Objective:    Vital Signs:   Temp:  [97.5 F (36.4 C)-98.8 F (37.1 C)] 98 F (36.7 C) (02/28 0400) Pulse Rate:  [77-94] 94 (02/27 1604) Resp:  [16] 16 (02/27 1604) BP: (89-118)/(58-85) 118/85 (02/27 2327) SpO2:  [95 %-98 %] 98 % (02/28 0400) Weight:  [231 lb (104.8 kg)-241 lb (109.3 kg)] 231 lb (104.8 kg) (02/28 0400)   Mean arterial Pressure 70-80s  Intake/Output:   Intake/Output Summary (Last 24 hours) at 12/01/16 0842 Last data filed at 12/01/16 0700  Gross per 24 hour  Intake                0 ml  Output             1475 ml  Net            -1475 ml     Physical Exam: General:  Fatigued appearing.  HEENT: Normal Neck: supple. JVP difficult but appears up to jaw. Carotids 2+ bilat; no bruits. No lymphadenopathy or thyromegaly appreciated. Cor: Mechanical heart sounds with LVAD hum present. Lungs: Diminished basilar sounds, L>R.  Abdomen: soft, moderately distended, mildly tender, no HSM. No bruits or masses. +BS  Driveline: C/D/I; securement device intact and driveline incorporated Extremities: no cyanosis, clubbing, rash, 1+ edema. Neuro: alert & orientedx3, cranial nerves grossly intact. moves all 4 extremities w/o difficulty. Affect flat.  Telemetry: Reviewed personally, V paced 90s  Labs: Basic Metabolic Panel:  Recent Labs Lab 11/30/16 1158 12/01/16 0636  NA 137 138  K 3.5 3.6  CL 94* 92*  CO2 35* 36*  GLUCOSE 134* 213*  BUN 15  18  CREATININE 1.06 1.04  CALCIUM 8.6* 8.5*    Liver Function Tests:  Recent Labs Lab 11/30/16 1158  AST 23  ALT 17  ALKPHOS 92  BILITOT 0.4  PROT 7.1  ALBUMIN 3.4*   No results for input(s): LIPASE, AMYLASE in the last 168 hours. No results for input(s): AMMONIA in the last 168 hours.  CBC:  Recent Labs Lab 11/30/16 1158 12/01/16 0636  WBC 7.4 6.4  HGB 9.1* 9.2*  HCT 30.9* 30.9*  MCV 85.6 84.7  PLT 175 178    INR:  Recent Labs Lab 11/25/16 1116 11/30/16 1044 12/01/16 0636  INR 1.28 1.23 1.23    Other results:  EKG:   Imaging: Dg Chest 2 View  Result Date: 11/30/2016 CLINICAL DATA:  Volume overload. Patient with left ventricular assist device. EXAM: CHEST  2 VIEW COMPARISON:  PA and lateral chest 11/17/2016. FINDINGS: New right PICC is in place with the tip in the lower superior vena cava. AICD and left ventricular assist device are again seen. There is cardiomegaly. Left pleural effusion and basilar airspace disease are unchanged. No evidence of pulmonary edema. No pneumothorax. IMPRESSION: No acute abnormality.  Negative for pulmonary edema. No change in a left pleural effusion and basilar  airspace disease, likely atelectasis. Tip of right PICC is in the lower superior vena cava. Cardiomegaly. Electronically Signed   By: Inge Rise M.D.   On: 11/30/2016 18:47   Dg Chest Port 1 View  Result Date: 12/01/2016 CLINICAL DATA:  PICC line placement EXAM: PORTABLE CHEST 1 VIEW COMPARISON:  11/30/2016 . FINDINGS: PICC line noted with tip projected over superior vena cava. AICD stable position. Left ventricular assist device in stable position . Cardiomegaly with normal pulmonary vascularity. Mild bibasilar subsegmental atelectasis again noted. Small left pleural effusion again noted. No pneumothorax IMPRESSION: 1.  PICC line stable position. 2. AICD in stable position. Left ventricular assist device in stable position. Prior CABG. Stable cardiomegaly. 3. Bibasilar  subsegmental atelectasis and small left pleural effusions again noted. No change from prior exam . Electronically Signed   By: [place]   On: 12/01/2016 07:42      Medications:     Scheduled Medications: . amiodarone  200 mg Oral Daily  . aspirin EC  81 mg Oral Daily  . atorvastatin  20 mg Oral QHS  . citalopram  10 mg Oral Daily  . dextromethorphan-guaiFENesin  1 tablet Oral BID  . docusate sodium  200 mg Oral Daily  . furosemide  80 mg Intravenous BID  . gabapentin  200 mg Oral BID  . insulin aspart  10 Units Subcutaneous TID WC  . insulin detemir  10 Units Subcutaneous BID AC & HS  . losartan  25 mg Oral Daily  . magnesium oxide  400 mg Oral Daily  . multivitamin with minerals  1 tablet Oral Daily  . pantoprazole  40 mg Oral Daily  . potassium chloride SA  20 mEq Oral Daily  . sodium chloride flush  10-40 mL Intracatheter Q12H  . spironolactone  25 mg Oral Daily  . Warfarin - Pharmacist Dosing Inpatient   Does not apply q1800     Infusions:   PRN Medications:  acetaminophen, albuterol, baclofen, ondansetron (ZOFRAN) IV, sodium chloride flush, traMADol   Assessment:   1. A/C End Stage Biventricular HF 2. 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 DMII 5. PAF 6. H/O L pleural effusion 7. On chronic anticoagulation - INR goal 2-2.5  8. Ascites s/p previous paracentesis  Plan/Discussion:    Good UO so far but markedly volume overloaded with CVP 20.  Will increase lasix to 80 mg TID. Supp K. Creatinine stable.   Coox 50%. Will diurese and perform Ramp Echo this afternoon to attempt to optimize cardiac output.  Pt wants to avoid milrinone if at all possible. Concerned about his RV function.   ABD [place] ordered and pending. May need paracentesis  I reviewed the LVAD parameters from today, and compared the results to the patient's prior recorded data.  No programming changes were made.  The LVAD is functioning within specified parameters.  The  patient performs LVAD self-test daily.  LVAD interrogation was negative for any significant power changes, alarms or PI events/speed drops.  LVAD equipment check completed and is in good working order.  Back-up equipment present.   LVAD education done on emergency procedures and precautions and reviewed exit site care.  Length of Stay: 1  Annamaria Helling 12/01/2016, 8:42 AM  VAD Team --- VAD ISSUES ONLY--- Pager [phone] (7am - 7am)  Advanced Heart Failure Team  Pager [phone] (M-F; 7a - 4p)  Please contact [name] for night-coverage after hours (4p -7a ) and  weekends on [email or web address]  Patient seen and examined with Oda Kilts, PA-C. We discussed all aspects of the encounter. I agree with the assessment and plan as stated above.   He feels poorly. CVP markedly elevated at 22 and co-ox low at 50%. May need to restart milrinone but hopefully may improve with diuresis. Renal function stable. K low - will supp.   Responding well to IV lasix. Will continue. Ramp echo done personally today and speed increased slightly. Ab u/s also viewed personally and no ascites.   Will continue diuresis for now and follow CVP and co-ox. If not improving will consider RHC and restart milrinone.   Robet Crutchfield,MD 9:49 PM

## 2016-12-01 NOTE — Progress Notes (Signed)
ANTICOAGULATION CONSULT NOTE - Follow Up Consult  Pharmacy Consult for Coumadin Indication: LVAD  Allergies  Allergen Reactions  . Codeine Nausea And Vomiting  . Lipitor [Atorvastatin] Nausea Only    Nausea with high doses, tolerates 20mg  dose (08/22/16)    Patient Measurements: Height: 5\' 10"  (177.8 cm) Weight: 231 lb (104.8 kg) IBW/kg (Calculated) : 73   Vital Signs: Temp: 98.4 F (36.9 C) (02/28 0857) Temp Source: Oral (02/28 0857) BP: 118/85 (02/27 2327)  Labs:  Recent Labs  11/30/16 1044 11/30/16 1158 12/01/16 0636  HGB  --  9.1* 9.2*  HCT  --  30.9* 30.9*  PLT  --  175 178  LABPROT 15.6*  --  15.6*  INR 1.23  --  1.23  CREATININE  --  1.06 1.04    Estimated Creatinine Clearance: 91.6 mL/min (by C-G formula based on SCr of 1.04 mg/dL).  Assessment: 61yom continues on coumadin for his LVAD (09/09/16). INR remains below goal at 1.23. He was seen in LVAD clinic on 2/22 - INR at that time 1.28 and per discussion with Dr. Haroldine Laws no bridge needed due to history of severe nosebleed. Hgb low but stable. Continues on amiodarone as pta.  Home dose: 10mg  daily except 7.5mg  Tue/Thu/Sat  Goal of Therapy:  INR 2-2.5 Monitor platelets by anticoagulation protocol: Yes   Plan:  1) Repeat coumadin 12.5mg  x 1 2) Daily INR  Deboraha Sprang 12/01/2016,10:02 AM

## 2016-12-01 NOTE — Progress Notes (Signed)
  Echocardiogram 2D Echocardiogram limited LVAD RAMP Study has been performed.  Tresa Res 12/01/2016, 1:50 PM

## 2016-12-01 NOTE — Progress Notes (Signed)
Cardiac Individual Treatment Plan  Patient Details  Name: Ronald Miller. MRN: [id number] Date of Birth: [birth year] Referring Provider:   Flowsheet Row Cardiac Rehab from 11/23/2016 in [facility] Cardiac and Pulmonary Rehab  Referring Provider  Glori Bickers MD      Initial Encounter Date:  Flowsheet Row Cardiac Rehab from 11/23/2016 in [facility] Cardiac and Pulmonary Rehab  Date  11/23/16  Referring Provider  Glori Bickers MD      Visit Diagnosis: Heart failure, chronic systolic ([place])  LVAD (left ventricular assist device) present [facility])  Patient's Home Medications on Admission: No current facility-administered medications for this visit.  No current outpatient prescriptions on file.  Facility-Administered Medications Ordered in Other Visits:  .  acetaminophen (TYLENOL) tablet 650 mg, 650 mg, Oral, Q4H PRN, Amy D Clegg, NP, 650 mg at 12/01/16 0509 .  albuterol (PROVENTIL) (2.5 MG/3ML) 0.083% nebulizer solution 2.5 mg, 2.5 mg, Inhalation, Q6H PRN, Amy D Clegg, NP .  amiodarone (PACERONE) tablet 200 mg, 200 mg, Oral, Daily, Amy D Clegg, NP .  aspirin EC tablet 81 mg, 81 mg, Oral, Daily, Amy D Clegg, NP .  atorvastatin (LIPITOR) tablet 20 mg, 20 mg, Oral, QHS, Amy D Clegg, NP, 20 mg at 11/30/16 2124 .  baclofen (LIORESAL) tablet 10 mg, 10 mg, Oral, TID PRN, Amy D Clegg, NP .  citalopram (CELEXA) tablet 10 mg, 10 mg, Oral, Daily, Amy D Clegg, NP .  dextromethorphan-guaiFENesin (MUCINEX DM) 30-600 MG per 12 hr tablet 1 tablet, 1 tablet, Oral, BID, Amy D Clegg, NP, 1 tablet at 11/30/16 2209 .  docusate sodium (COLACE) capsule 200 mg, 200 mg, Oral, Daily, Amy D Clegg, NP .  furosemide (LASIX) injection 80 mg, 80 mg, Intravenous, BID, Amy D Clegg, NP, 80 mg at 11/30/16 1735 .  gabapentin (NEURONTIN) capsule 200 mg, 200 mg, Oral, BID, Amy D Clegg, NP, 200 mg at 11/30/16 2209 .  insulin aspart (novoLOG) injection 10 Units, 10 Units, Subcutaneous, TID WC, Amy D Clegg, NP .  insulin detemir  (LEVEMIR) injection 10 Units, 10 Units, Subcutaneous, BID AC & HS, Amy D Clegg, NP, 10 Units at 11/30/16 2209 .  losartan (COZAAR) tablet 25 mg, 25 mg, Oral, Daily, Amy D Clegg, NP .  magnesium oxide (MAG-OX) tablet 400 mg, 400 mg, Oral, Daily, Amy D Clegg, NP .  multivitamin with minerals tablet 1 tablet, 1 tablet, Oral, Daily, Amy D Clegg, NP .  ondansetron (ZOFRAN) injection 4 mg, 4 mg, Intravenous, Q6H PRN, Amy D Clegg, NP .  pantoprazole (PROTONIX) EC tablet 40 mg, 40 mg, Oral, Daily, Amy D Clegg, NP .  potassium chloride SA (K-DUR,KLOR-CON) CR tablet 20 mEq, 20 mEq, Oral, Daily, Amy D Clegg, NP .  sodium chloride flush (NS) 0.9 % injection 10-40 mL, 10-40 mL, Intracatheter, Q12H, Jolaine Artist, MD, 10 mL at 11/30/16 2209 .  sodium chloride flush (NS) 0.9 % injection 10-40 mL, 10-40 mL, Intracatheter, PRN, Jolaine Artist, MD .  spironolactone (ALDACTONE) tablet 25 mg, 25 mg, Oral, Daily, Amy D Clegg, NP .  traMADol (ULTRAM) tablet 50 mg, 50 mg, Oral, Q12H PRN, Conrad Ehrenberg, NP .  Warfarin - Pharmacist Dosing Inpatient, , Does not apply, q1800, Otilio Miu, [facility]  Past Medical History: Past Medical History:  Diagnosis Date  . AICD (automatic cardioverter/defibrillator) present   . ASCVD (arteriosclerotic cardiovascular disease)   . Chronic systolic CHF (congestive heart failure) ([place])   . COPD (chronic obstructive pulmonary disease) ([place])   .  Coronary artery disease   . Depression   . Diabetes mellitus   . History of cocaine abuse   . Hypertension   . Presence of permanent cardiac pacemaker   . Shortness of breath dyspnea   . Suicidal ideation   . Tobacco abuse     Tobacco Use: History  Smoking Status  . Former Smoker  . Packs/day: 1.50  . Years: 23.00  . Types: Cigarettes  . Quit date: 08/27/2015  Smokeless Tobacco  . Never Used    Labs: Recent Review Flowsheet Data    Labs for ITP Cardiac and Pulmonary Rehab Latest Ref Rng & Units 09/21/2016 09/22/2016  09/23/2016 09/24/2016 09/25/2016   Cholestrol 0 - 200 mg/dL - - - - -   LDLCALC 0 - 100 mg/dL - - - - -   HDL 40 - 60 mg/dL - - - - -   Trlycerides 0 - 200 mg/dL - - - - -   Hemoglobin A1c 4.8 - 5.6 % - - - - -   PHART 7.350 - 7.450 - - - - -   PCO2ART 32.0 - 48.0 mmHg - - - - -   HCO3 20.0 - 28.0 mmol/L - - - - -   TCO2 0 - 100 mmol/L - - - - -   ACIDBASEDEF 0.0 - 2.0 mmol/L - - - - -   O2SAT % 51.1 48.2 60.1 53.9 52.9       Exercise Target Goals:    Exercise Program Goal: Individual exercise prescription set with THRR, safety & activity barriers. Participant demonstrates ability to understand and report RPE using BORG scale, to self-measure pulse accurately, and to acknowledge the importance of the exercise prescription.  Exercise Prescription Goal: Starting with aerobic activity 30 plus minutes a day, 3 days per week for initial exercise prescription. Provide home exercise prescription and guidelines that participant acknowledges understanding prior to discharge.  Activity Barriers & Risk Stratification:     Activity Barriers & Cardiac Risk Stratification - 11/18/16 1447      Activity Barriers & Cardiac Risk Stratification   Activity Barriers Deconditioning;Muscular Weakness;Shortness of Breath;Decreased Ventricular Function;Back Problems   Cardiac Risk Stratification High      6 Minute Walk:     6 Minute Walk    Row Name 11/23/16 1030         6 Minute Walk   Phase Initial     Distance 755 feet     Walk Time 4 minutes     # of Rest Breaks 1  30 sec     MPH 2.14     METS 2.61     RPE 15     Perceived Dyspnea  3     VO2 Peak 6.6     Symptoms Yes (comment)     Comments SOB     Resting HR 61 bpm     Resting BP -  92 dopplar     Max Ex. HR 100 bpm     Max Ex. BP -  90 dopplar        Oxygen Initial Assessment:   Oxygen Re-Evaluation:   Oxygen Discharge (Final Oxygen Re-Evaluation):   Initial Exercise Prescription:     Initial Exercise  Prescription - 11/23/16 1000      Date of Initial Exercise RX and Referring Provider   Date 11/23/16   Referring Provider Glori Bickers MD     Treadmill   MPH 1.4   Grade 0.5   Minutes 15  METs 2.07     NuStep   Level 1   Minutes 15   METs 2     REL-XR   Level 1   Minutes 15   METs 2     Prescription Details   Frequency (times per week) 2   Duration Progress to 45 minutes of aerobic exercise without signs/symptoms of physical distress     Intensity   THRR 40-80% of Max Heartrate 100-140   Ratings of Perceived Exertion 11-13   Perceived Dyspnea 0-4     Progression   Progression Continue to progress workloads to maintain intensity without signs/symptoms of physical distress.     Resistance Training   Training Prescription Yes   Weight 3 lbs   Reps 10-15      Perform Capillary Blood Glucose checks as needed.  Exercise Prescription Changes:     Exercise Prescription Changes    Row Name 11/23/16 1000             Response to Exercise   Blood Pressure (Admit) -  92 dopplar       Blood Pressure (Exercise) -  90 dopplar       Heart Rate (Admit) 61 bpm       Heart Rate (Exercise) 100 bpm       Oxygen Saturation (Admit) 94 %       Rating of Perceived Exertion (Exercise) 15       Perceived Dyspnea (Exercise) 3       Symptoms SOB         Exercise Review   Progression -  walk test results          Exercise Comments:     Exercise Comments    Row Name 11/24/16 1516 11/25/16 1029 11/25/16 1030       Exercise Comments [name] returned to rehab to complete his sessions.  He started back on session number 5.  He completed his 6MWT to reset his workloads.  We will continue to monitor his progression. Dizzy and lightheaded on Treadmill.  Dopplar BP was 96 which was up from 90 at check in and 86 after the NuStep.  He was given some water and rested.  He also complained SOB where he felt like he just could not take a deep breath and had some gas trapped (burping  some).  He was on his way to see Dr. Haroldine Laws for a follow up appt today.  First full day of exercise!  Patient was oriented to gym and equipment including functions, settings, policies, and procedures.  Patient's individual exercise prescription and treatment plan were reviewed.  All starting workloads were established based on the results of the 6 minute walk test done at initial orientation visit.  The plan for exercise progression was also introduced and progression will be customized based on patient's performance and goals.        Exercise Goals and Review:   Exercise Goals Re-Evaluation :   Discharge Exercise Prescription (Final Exercise Prescription Changes):     Exercise Prescription Changes - 11/23/16 1000      Response to Exercise   Blood Pressure (Admit) --  92 dopplar   Blood Pressure (Exercise) --  90 dopplar   Heart Rate (Admit) 61 bpm   Heart Rate (Exercise) 100 bpm   Oxygen Saturation (Admit) 94 %   Rating of Perceived Exertion (Exercise) 15   Perceived Dyspnea (Exercise) 3   Symptoms SOB     Exercise Review   Progression --  walk test results      Nutrition:  Target Goals: Understanding of nutrition guidelines, daily intake of sodium <1565m, cholesterol <2063m calories 30% from fat and 7% or less from saturated fats, daily to have 5 or more servings of fruits and vegetables.  Biometrics:     Pre Biometrics - 11/23/16 1036      Pre Biometrics   Height '5\' 10"'  (1.778 m)   Weight 230 lb 12.8 oz (104.7 kg)   Waist Circumference 45.5 inches   Hip Circumference 43.5 inches   Waist to Hip Ratio 1.05 %   BMI (Calculated) 33.2   Single Leg Stand 8.16 seconds       Nutrition Therapy Plan and Nutrition Goals:     Nutrition Therapy & Goals - 11/23/16 1036      Nutrition Therapy   Drug/Food Interactions Statins/Certain Fruits     Intervention Plan   Intervention Prescribe, educate and counsel regarding individualized specific dietary modifications  aiming towards targeted core components such as weight, hypertension, lipid management, diabetes, heart failure and other comorbidities.   Expected Outcomes Short Term Goal: Understand basic principles of dietary content, such as calories, fat, sodium, cholesterol and nutrients.;Long Term Goal: Adherence to prescribed nutrition plan.;Short Term Goal: A plan has been developed with personal nutrition goals set during dietitian appointment.      Nutrition Discharge: Rate Your Plate Scores:     Nutrition Assessments - 11/23/16 1036      MEDFICTS Scores   Pre Score 6      Nutrition Goals Re-Evaluation:   Nutrition Goals Discharge (Final Nutrition Goals Re-Evaluation):   Psychosocial: Target Goals: Acknowledge presence or absence of significant depression and/or stress, maximize coping skills, provide positive support system. Participant is able to verbalize types and ability to use techniques and skills needed for reducing stress and depression.   Initial Review & Psychosocial Screening:     Initial Psych Review & Screening - 11/23/16 1036      Initial Review   Current issues with Current Depression;Current Anxiety/Panic;History of Depression     Family Dynamics   Good Support System? Yes  mother and sister   Comments New to recognizing his depression since his LVAD.  Was rejected from transplant list current for cocaine in urine (states he has been clean).  Still getting used to LVAD device.     Barriers   Psychosocial barriers to participate in program The patient should benefit from training in stress management and relaxation.;Psychosocial barriers identified (see note)     Screening Interventions   Interventions Encouraged to exercise;Program counselor consult      Quality of Life Scores:      Quality of Life - 11/23/16 1039      Quality of Life Scores   Health/Function Pre 19.33 %   Socioeconomic Pre 18.75 %   Psych/Spiritual Pre 21.93 %   Family Pre 29.38 %    GLOBAL Pre 21.05 %      PHQ-9: Recent Review Flowsheet Data    [facility] 11/23/2016 05/24/2016 02/03/2016 11/06/2015   Decreased Interest 1 1 0 1   Down, Depressed, Hopeless 1 0 0 2   PHQ - 2 Score 2 1 0 3   Altered sleeping 2 2 - -   Tired, decreased energy 1 1 - -   Change in appetite 0 0 - -   Feeling bad or failure about yourself  2 1 - -   Trouble concentrating 2 0 - -  Moving slowly or fidgety/restless 1 2 - -   Suicidal thoughts 0 0 - 0   PHQ-9 Score 10 7 - -   Difficult doing work/chores Not difficult at all Not difficult at all - -     Interpretation of Total Score  Total Score Depression Severity:  1-4 = Minimal depression, 5-9 = Mild depression, 10-14 = Moderate depression, 15-19 = Moderately severe depression, 20-27 = Severe depression   Psychosocial Evaluation and Intervention:     Psychosocial Evaluation - 11/23/16 0924      Psychosocial Evaluation & Interventions   Interventions Stress management education;Relaxation education;Encouraged to exercise with the program and follow exercise prescription   Comments Counselor met with Mr. Ferrante today for initial psychosocial evaluation.  He is a 61 year old who had an LVAD procedure several months ago.  He has a strong support system with lots of family close by and he currently lives with his sister and her husband.  Mr. Emmick also has an adult daughter who lives locally and checks on him often by phone.  He has an extensive history of heart disease with 6 by passes 20 years ago and he also is Type 2 Diabetic.  He reports not sleeping well since the surgery in December until last night subsequent to Dr. prescribing a new medication to help with this.  He denies a history of depression or anxiety but states his Dr. thinks he "might be depressed."  Mr. Fidalgo claims he is typically in a good mood and kids around with others quite a bit.  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  He is on the heart transplant list and quit smoking in November (several months ago) so hoping to get a heart in the near future.  Staff and counselor will be monitoring Mr. Knotts throughout the course of this program.        Psychosocial Re-Evaluation:   Psychosocial Discharge (Final Psychosocial Re-Evaluation):   Vocational Rehabilitation: Provide vocational rehab assistance to qualifying candidates.   Vocational Rehab Evaluation & Intervention:     Vocational Rehab - 11/23/16 1035      Initial Vocational Rehab Evaluation & Intervention   Assessment shows need for Vocational Rehabilitation No      Education: Education Goals: Education classes will be provided on a weekly basis, covering required topics. Participant will state understanding/return demonstration of topics presented.  Learning Barriers/Preferences:     Learning Barriers/Preferences - 11/23/16 1035      Learning Barriers/Preferences   Learning Barriers Exercise Concerns   Learning Preferences None      Education Topics: General Nutrition Guidelines/Fats and Fiber: -Group instruction provided by verbal, written material, models and posters to present the general guidelines for heart healthy nutrition. Gives an explanation and review of dietary fats and fiber.   Controlling Sodium/Reading Food Labels: -Group verbal and written material supporting the discussion of sodium use in heart healthy nutrition. Review and explanation with models, verbal and written materials for utilization of the food label.   Exercise Physiology & Risk Factors: - Group verbal and written instruction with models to review the exercise physiology of the cardiovascular system and associated critical values. Details cardiovascular disease risk factors and the goals associated with each risk factor.   Aerobic Exercise & Resistance Training: - Gives group verbal and written discussion on the health impact  of inactivity. On the components of aerobic and resistive training programs and  the benefits of this training and how to safely progress through these programs.   Flexibility, Balance, General Exercise Guidelines: - Provides group verbal and written instruction on the benefits of flexibility and balance training programs. Provides general exercise guidelines with specific guidelines to those with heart or lung disease. Demonstration and skill practice provided.   Stress Management: - Provides group verbal and written instruction about the health risks of elevated stress, cause of high stress, and healthy ways to reduce stress.   Depression: - Provides group verbal and written instruction on the correlation between heart/lung disease and depressed mood, treatment options, and the stigmas associated with seeking treatment.   Anatomy & Physiology of the Heart: - Group verbal and written instruction and models provide basic cardiac anatomy and physiology, with the coronary electrical and arterial systems. Review of: AMI, Angina, Valve disease, Heart Failure, Cardiac Arrhythmia, Pacemakers, and the ICD.   Cardiac Procedures: - Group verbal and written instruction and models to describe the testing methods done to diagnose heart disease. Reviews the outcomes of the test results. Describes the treatment choices: Medical Management, Angioplasty, or Coronary Bypass Surgery.   Cardiac Medications: - Group verbal and written instruction to review commonly prescribed medications for heart disease. Reviews the medication, class of the drug, and side effects. Includes the steps to properly store meds and maintain the prescription regimen. Flowsheet Row Cardiac Rehab from 11/23/2016 in [facility] Cardiac and Pulmonary Rehab  Date  11/23/16  Educator  SB  Instruction Review Code  2- meets goals/outcomes      Go Sex-Intimacy & Heart Disease, Get SMART - Goal Setting: - Group verbal and written instruction  through game format to discuss heart disease and the return to sexual intimacy. Provides group verbal and written material to discuss and apply goal setting through the application of the S.M.A.R.T. Method.   Other Matters of the Heart: - Provides group verbal, written materials and models to describe Heart Failure, Angina, Valve Disease, and Diabetes in the realm of heart disease. Includes description of the disease process and treatment options available to the cardiac patient.   Exercise & Equipment Safety: - Individual verbal instruction and demonstration of equipment use and safety with use of the equipment. Flowsheet Row Cardiac Rehab from 11/23/2016 in [facility] Cardiac and Pulmonary Rehab  Date  11/23/16  Educator  CE  Instruction Review Code  2- meets goals/outcomes      Infection Prevention: - Provides verbal and written material to individual with discussion of infection control including proper hand washing and proper equipment cleaning during exercise session. Flowsheet Row Cardiac Rehab from 11/23/2016 in [facility] Cardiac and Pulmonary Rehab  Date  11/23/16  Educator  CE  Instruction Review Code  2- meets goals/outcomes      Falls Prevention: - Provides verbal and written material to individual with discussion of falls prevention and safety. Flowsheet Row Cardiac Rehab from 11/23/2016 in [facility] Cardiac and Pulmonary Rehab  Date  11/23/16  Educator  CE  Instruction Review Code  2- meets goals/outcomes      Diabetes: - Individual verbal and written instruction to review signs/symptoms of diabetes, desired ranges of glucose level fasting, after meals and with exercise. Advice that pre and post exercise glucose checks will be done for 3 sessions at entry of program. [place] from 11/23/2016 in [facility] Cardiac and Pulmonary Rehab  Date  11/23/16  Educator  CE  Instruction Review Code  2- meets goals/outcomes       Knowledge  Questionnaire Score:     Knowledge  Questionnaire Score - 11/23/16 1035      Knowledge Questionnaire Score   Pre Score 21/28      Core Components/Risk Factors/Patient Goals at Admission:     Personal Goals and Risk Factors at Admission - 11/18/16 1446      Core Components/Risk Factors/Patient Goals on Admission    Weight Management Yes;Weight Loss   Intervention Weight Management: Develop a combined nutrition and exercise program designed to reach desired caloric intake, while maintaining appropriate intake of nutrient and fiber, sodium and fats, and appropriate energy expenditure required for the weight goal.;Weight Management: Provide education and appropriate resources to help participant work on and attain dietary goals.   Expected Outcomes Short Term: Continue to assess and modify interventions until short term weight is achieved;Long Term: Adherence to nutrition and physical activity/exercise program aimed toward attainment of established weight goal;Weight Loss: Understanding of general recommendations for a balanced deficit meal plan, which promotes 1-2 lb weight loss per week and includes a negative energy balance of [phone] kcal/d;Understanding recommendations for meals to include 15-35% energy as protein, 25-35% energy from fat, 35-60% energy from carbohydrates, less than 252m of dietary cholesterol, 20-35 gm of total fiber daily;Understanding of distribution of calorie intake throughout the day with the consumption of 4-5 meals/snacks   Sedentary Yes   Intervention Provide advice, education, support and counseling about physical activity/exercise needs.;Develop an individualized exercise prescription for aerobic and resistive training based on initial evaluation findings, risk stratification, comorbidities and participant's personal goals.   Expected Outcomes Achievement of increased cardiorespiratory fitness and enhanced flexibility, muscular endurance and strength shown through measurements of functional capacity and  personal statement of participant.   Increase Strength and Stamina Yes   Intervention Provide advice, education, support and counseling about physical activity/exercise needs.;Develop an individualized exercise prescription for aerobic and resistive training based on initial evaluation findings, risk stratification, comorbidities and participant's personal goals.   Expected Outcomes Achievement of increased cardiorespiratory fitness and enhanced flexibility, muscular endurance and strength shown through measurements of functional capacity and personal statement of participant.   Improve shortness of breath with ADL's Yes   Intervention Provide education, individualized exercise plan and daily activity instruction to help decrease symptoms of SOB with activities of daily living.   Expected Outcomes Short Term: Achieves a reduction of symptoms when performing activities of daily living.   Diabetes Yes   Intervention Provide education about signs/symptoms and action to take for hypo/hyperglycemia.;Provide education about proper nutrition, including hydration, and aerobic/resistive exercise prescription along with prescribed medications to achieve blood glucose in normal ranges: Fasting glucose 65-99 mg/dL   Expected Outcomes Short Term: Participant verbalizes understanding of the signs/symptoms and immediate care of hyper/hypoglycemia, proper foot care and importance of medication, aerobic/resistive exercise and nutrition plan for blood glucose control.;Long Term: Attainment of HbA1C < 7%.   Heart Failure Yes   Intervention Provide a combined exercise and nutrition program that is supplemented with education, support and counseling about heart failure. Directed toward relieving symptoms such as shortness of breath, decreased exercise tolerance, and extremity edema.   Expected Outcomes Improve functional capacity of life;Short term: Attendance in program 2-3 days a week with increased exercise capacity.  Reported lower sodium intake. Reported increased fruit and vegetable intake. Reports medication compliance.;Short term: Daily weights obtained and reported for increase. Utilizing diuretic protocols set by physician.;Long term: Adoption of self-care skills and reduction of barriers for early signs and symptoms recognition and intervention leading to self-care  maintenance.   Hypertension Yes   Intervention Provide education on lifestyle modifcations including regular physical activity/exercise, weight management, moderate sodium restriction and increased consumption of fresh fruit, vegetables, and low fat dairy, alcohol moderation, and smoking cessation.;Monitor prescription use compliance.   Expected Outcomes Short Term: Continued assessment and intervention until BP is < 140/82m HG in hypertensive participants. < 130/857mHG in hypertensive participants with diabetes, heart failure or chronic kidney disease.;Long Term: Maintenance of blood pressure at goal levels.   Lipids Yes   Intervention Provide education and support for participant on nutrition & aerobic/resistive exercise along with prescribed medications to achieve LDL <7022mHDL >63m23m Expected Outcomes Long Term: Cholesterol controlled with medications as prescribed, with individualized exercise RX and with personalized nutrition plan. Value goals: LDL < 70mg110mL > 40 mg.;Short Term: Participant states understanding of desired cholesterol values and is compliant with medications prescribed. Participant is following exercise prescription and nutrition guidelines.   Stress Yes   Intervention Offer individual and/or small group education and counseling on adjustment to heart disease, stress management and health-related lifestyle change. Teach and support self-help strategies.;Refer participants experiencing significant psychosocial distress to appropriate mental health specialists for further evaluation and treatment. When possible, include  family members and significant others in education/counseling sessions.   Expected Outcomes Long Term: Emotional wellbeing is indicated by absence of clinically significant psychosocial distress or social isolation.;Short Term: Participant demonstrates changes in health-related behavior, relaxation and other stress management skills, ability to obtain effective social support, and compliance with psychotropic medications if prescribed.      Core Components/Risk Factors/Patient Goals Review:    Core Components/Risk Factors/Patient Goals at Discharge (Final Review):    ITP Comments:     ITP Comments    Row Name 11/18/16 1422 11/23/16 1029 12/01/16 0604       ITP Comments Called [name] to discuss having him return to Cardiac Rehab.  He would like to come to the Tues/Thurs 8 am class.  He will be restarting on Feb 20.  He will have a new referral and start on visit number 5 to complete his rehab for heart failure. [name] returned to rehab today.  ITP resumed with new addition of LVAD to his CHF diagnosis.  [facility] 30 day review. Continue with ITP unless directed changes per Medical Director review. Has returned to complete sessions after LVAD placement        Comments:

## 2016-12-01 NOTE — Progress Notes (Signed)
Speed  Flow  PI  Power  LVIDD  AI  Aortic openings  MR  TR  Septum  RV   5900  5.4 2.8 4.8 5.6 trivial 5/5 closed  trivial  trivial midline  Mild to mod  6100  5.6 2.5 5.1 5.9 trivial 0/5 closed trivial mild left Mild to mod   5700 5.2 2.6 5.5   3/5 closed  mild right Mild to mod   6000 5.6 2.4 4.8   5/5 closed trivial trivial left                                 Ramp ECHO performed at bedside. Dr Haroldine Laws at bedside. At completion of ramp study, patients primary and back up controller programmed:  Fixed speed: 6000 Low speed limit: 5700   Balinda Quails RN, VAD Coordinator  I was present for and supervised entire study. Agree with above.   Bensimhon, Daniel,MD 10:15 PM   24/7 pager [phone]

## 2016-12-02 ENCOUNTER — Encounter: Payer: Self-pay | Admitting: *Deleted

## 2016-12-02 ENCOUNTER — Encounter (HOSPITAL_COMMUNITY): Payer: Self-pay

## 2016-12-02 ENCOUNTER — Encounter: Payer: Medicare HMO | Attending: Internal Medicine

## 2016-12-02 DIAGNOSIS — Z95811 Presence of heart assist device: Secondary | ICD-10-CM | POA: Insufficient documentation

## 2016-12-02 DIAGNOSIS — I5022 Chronic systolic (congestive) heart failure: Secondary | ICD-10-CM

## 2016-12-02 LAB — BASIC METABOLIC PANEL
ANION GAP: 7 (ref 5–15)
BUN: 18 mg/dL (ref 6–20)
CALCIUM: 8.6 mg/dL — AB (ref 8.9–10.3)
CO2: 36 mmol/L — ABNORMAL HIGH (ref 22–32)
Chloride: 94 mmol/L — ABNORMAL LOW (ref 101–111)
Creatinine, Ser: 1.05 mg/dL (ref 0.61–1.24)
Glucose, Bld: 131 mg/dL — ABNORMAL HIGH (ref 65–99)
Potassium: 3.8 mmol/L (ref 3.5–5.1)
Sodium: 137 mmol/L (ref 135–145)

## 2016-12-02 LAB — PROTIME-INR
INR: 1.24
PROTHROMBIN TIME: 15.6 s — AB (ref 11.4–15.2)

## 2016-12-02 LAB — COOXEMETRY PANEL
CARBOXYHEMOGLOBIN: 1 % (ref 0.5–1.5)
CARBOXYHEMOGLOBIN: 1.7 % — AB (ref 0.5–1.5)
Carboxyhemoglobin: 1.2 % (ref 0.5–1.5)
Methemoglobin: 1.1 % (ref 0.0–1.5)
Methemoglobin: 0.9 % (ref 0.0–1.5)
Methemoglobin: 1.2 % (ref 0.0–1.5)
O2 SAT: 43 %
O2 Saturation: 38.6 %
O2 Saturation: 62.2 %
TOTAL HEMOGLOBIN: 9.8 g/dL — AB (ref 12.0–16.0)
Total hemoglobin: 11.1 g/dL — ABNORMAL LOW (ref 12.0–16.0)
Total hemoglobin: 9.7 g/dL — ABNORMAL LOW (ref 12.0–16.0)

## 2016-12-02 LAB — CBC
HCT: 31.9 % — ABNORMAL LOW (ref 39.0–52.0)
Hemoglobin: 9.5 g/dL — ABNORMAL LOW (ref 13.0–17.0)
MCH: 25 pg — ABNORMAL LOW (ref 26.0–34.0)
MCHC: 29.8 g/dL — ABNORMAL LOW (ref 30.0–36.0)
MCV: 83.9 fL (ref 78.0–100.0)
PLATELETS: 188 10*3/uL (ref 150–400)
RBC: 3.8 MIL/uL — ABNORMAL LOW (ref 4.22–5.81)
RDW: 15.9 % — AB (ref 11.5–15.5)
WBC: 7 10*3/uL (ref 4.0–10.5)

## 2016-12-02 LAB — HEPARIN LEVEL (UNFRACTIONATED): Heparin Unfractionated: 0.17 IU/mL — ABNORMAL LOW (ref 0.30–0.70)

## 2016-12-02 LAB — GLUCOSE, CAPILLARY
GLUCOSE-CAPILLARY: 119 mg/dL — AB (ref 65–99)
GLUCOSE-CAPILLARY: 147 mg/dL — AB (ref 65–99)
Glucose-Capillary: 138 mg/dL — ABNORMAL HIGH (ref 65–99)
Glucose-Capillary: 220 mg/dL — ABNORMAL HIGH (ref 65–99)

## 2016-12-02 LAB — LACTATE DEHYDROGENASE: LDH: 171 U/L (ref 98–192)

## 2016-12-02 MED ORDER — HEPARIN (PORCINE) IN NACL 100-0.45 UNIT/ML-% IJ SOLN
1800.0000 [IU]/h | INTRAMUSCULAR | Status: DC
  Administered 2016-12-02: 1350 [IU]/h via INTRAVENOUS
  Administered 2016-12-03 – 2016-12-06 (×5): 1800 [IU]/h via INTRAVENOUS
  Filled 2016-12-02 (×7): qty 250

## 2016-12-02 MED ORDER — SENNA 8.6 MG PO TABS
1.0000 | ORAL_TABLET | Freq: Every evening | ORAL | Status: DC | PRN
  Administered 2016-12-02 – 2016-12-05 (×4): 8.6 mg via ORAL
  Filled 2016-12-02 (×4): qty 1

## 2016-12-02 MED ORDER — FUROSEMIDE 10 MG/ML IJ SOLN
80.0000 mg | Freq: Two times a day (BID) | INTRAMUSCULAR | Status: DC
  Administered 2016-12-02: 80 mg via INTRAVENOUS
  Filled 2016-12-02: qty 8

## 2016-12-02 MED ORDER — WARFARIN SODIUM 7.5 MG PO TABS
15.0000 mg | ORAL_TABLET | Freq: Once | ORAL | Status: AC
  Administered 2016-12-02: 15 mg via ORAL
  Filled 2016-12-02: qty 2

## 2016-12-02 MED ORDER — FUROSEMIDE 10 MG/ML IJ SOLN
80.0000 mg | Freq: Once | INTRAMUSCULAR | Status: AC
  Administered 2016-12-02: 80 mg via INTRAVENOUS
  Filled 2016-12-02: qty 8

## 2016-12-02 MED ORDER — ZOLPIDEM TARTRATE 5 MG PO TABS
10.0000 mg | ORAL_TABLET | Freq: Every evening | ORAL | Status: DC | PRN
  Administered 2016-12-02 – 2016-12-05 (×4): 10 mg via ORAL
  Filled 2016-12-02 (×4): qty 2

## 2016-12-02 NOTE — Progress Notes (Signed)
Advanced Heart Failure VAD Team HM-3 Note  Subjective:    Admitted from clinic 11/30/16 with worsening fatigue and DOE. Weight up at least 15 lbs.  PICC placed for Coox and CVP with RV failure post VAD.     Coox 38.6% this am.  STAT repeat pending. CVP 10.  Out 5.1 L and down 13 lbs from admit.   Pt states he is feeling much better despite drop in Coox. Very upset that he may need milrinone again.  Less orthopneic.  Minimal DOE. No CP.  Pump turned up to 6000 yesterday. Marked increase in PI events.  ABD [place] 12/01/16 with no ascites appreciated.  LVAD INTERROGATION:  HeartMate 3 LVAD:  Flow 5.4 liters/min, speed 6000, power 4.8, PI 2.3. Over 40 PI events.  Objective:    Vital Signs:   Temp:  [97.4 F (36.3 C)-98.5 F (36.9 C)] 97.6 F (36.4 C) (02/28 1944) Resp:  [14-20] 20 (03/01 0000) BP: (73-95)/(61-81) 95/81 (03/01 0100) SpO2:  [94 %-99 %] 94 % (03/01 0400) Weight:  [228 lb 9.6 oz (103.7 kg)] 228 lb 9.6 oz (103.7 kg) (03/01 0500) Last BM Date: 11/30/16 Mean arterial Pressure 84  Intake/Output:   Intake/Output Summary (Last 24 hours) at 12/02/16 0731 Last data filed at 12/02/16 0500  Gross per 24 hour  Intake             1840 ml  Output             7000 ml  Net            -5160 ml     Physical Exam: General:  Well appearing. Sitting in chair HEENT: Normal Neck: supple. JVP 9-10 cm. Carotids 2+ bilat; no bruits. No lymphadenopathy or thyromegaly appreciated. Cor: Mechanical heart sounds with LVAD hum present. Lungs: Diminished basilar sounds Abdomen: Soft, mild/mod distention, mildly tender, no HSM. No bruits or masses. +BS  Driveline: C/D/I; securement device intact and driveline incorporated Extremities: no cyanosis, clubbing, rash, Trace ankle edema.  Neuro: alert & orientedx3, cranial nerves grossly intact. moves all 4 extremities w/o difficulty. Affect flat but appropriate  Telemetry: Personally reviewed, V paced 90-90s   Labs: Basic Metabolic  Panel:  Recent Labs Lab 11/30/16 1158 12/01/16 0636 12/02/16 0445  NA 137 138 137  K 3.5 3.6 3.8  CL 94* 92* 94*  CO2 35* 36* 36*  GLUCOSE 134* 213* 131*  BUN 15 18 18   CREATININE 1.06 1.04 1.05  CALCIUM 8.6* 8.5* 8.6*    Liver Function Tests:  Recent Labs Lab 11/30/16 1158  AST 23  ALT 17  ALKPHOS 92  BILITOT 0.4  PROT 7.1  ALBUMIN 3.4*   No results for input(s): LIPASE, AMYLASE in the last 168 hours. No results for input(s): AMMONIA in the last 168 hours.  CBC:  Recent Labs Lab 11/30/16 1158 12/01/16 0636 12/02/16 0445  WBC 7.4 6.4 7.0  HGB 9.1* 9.2* 9.5*  HCT 30.9* 30.9* 31.9*  MCV 85.6 84.7 83.9  PLT 175 178 188    INR:  Recent Labs Lab 11/25/16 1116 11/30/16 1044 12/01/16 0636 12/02/16 0445  INR 1.28 1.23 1.23 1.24    Other results:  EKG:   Imaging: Dg Chest 2 View  Result Date: 11/30/2016 CLINICAL DATA:  Volume overload. Patient with left ventricular assist device. EXAM: CHEST  2 VIEW COMPARISON:  PA and lateral chest 11/17/2016. FINDINGS: New right PICC is in place with the tip in the lower superior vena cava. AICD and left ventricular assist  device are again seen. There is cardiomegaly. Left pleural effusion and basilar airspace disease are unchanged. No evidence of pulmonary edema. No pneumothorax. IMPRESSION: No acute abnormality.  Negative for pulmonary edema. No change in a left pleural effusion and basilar airspace disease, likely atelectasis. Tip of right PICC is in the lower superior vena cava. Cardiomegaly. Electronically Signed   By: Inge Rise M.D.   On: 11/30/2016 18:47   US Abdomen Limited  Result Date: 12/01/2016 CLINICAL DATA:  Evaluate for the presence of ascites EXAM: LIMITED ABDOMEN ULTRASOUND FOR ASCITES TECHNIQUE: Limited ultrasound survey for ascites was performed in all four abdominal quadrants. COMPARISON:  None. FINDINGS: Ultrasound over all 4 quadrants was performed. No ascites is seen. There is incidental right  pleural effusion noted. IMPRESSION: No ascites.  Incidental right pleural effusion. Electronically Signed   By: Ivar Drape M.D.   On: 12/01/2016 16:58   Dg Chest Port 1 View  Result Date: 12/01/2016 CLINICAL DATA:  PICC line placement EXAM: PORTABLE CHEST 1 VIEW COMPARISON:  11/30/2016 . FINDINGS: PICC line noted with tip projected over superior vena cava. AICD stable position. Left ventricular assist device in stable position . Cardiomegaly with normal pulmonary vascularity. Mild bibasilar subsegmental atelectasis again noted. Small left pleural effusion again noted. No pneumothorax IMPRESSION: 1.  PICC line stable position. 2. AICD in stable position. Left ventricular assist device in stable position. Prior CABG. Stable cardiomegaly. 3. Bibasilar subsegmental atelectasis and small left pleural effusions again noted. No change from prior exam . Electronically Signed   By: [place]   On: 12/01/2016 07:42     Medications:     Scheduled Medications: . amiodarone  200 mg Oral Daily  . aspirin EC  81 mg Oral Daily  . atorvastatin  20 mg Oral QHS  . citalopram  10 mg Oral Daily  . dextromethorphan-guaiFENesin  1 tablet Oral BID  . docusate sodium  200 mg Oral Daily  . furosemide  80 mg Intravenous TID  . gabapentin  200 mg Oral BID  . insulin aspart  10 Units Subcutaneous TID WC  . insulin detemir  10 Units Subcutaneous BID AC & HS  . losartan  25 mg Oral Daily  . magnesium oxide  400 mg Oral Daily  . multivitamin with minerals  1 tablet Oral Daily  . pantoprazole  40 mg Oral Daily  . potassium chloride SA  20 mEq Oral BID  . sodium chloride flush  10-40 mL Intracatheter Q12H  . spironolactone  25 mg Oral Daily  . Warfarin - Pharmacist Dosing Inpatient   Does not apply q1800    Infusions:   PRN Medications: acetaminophen, albuterol, baclofen, ondansetron (ZOFRAN) IV, sodium chloride flush, traMADol, zolpidem   Assessment:   1. A/C End Stage Biventricular HF 2. 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 DMII 5. PAF 6. H/O L pleural effusion 7. On chronic anticoagulation - INR goal 2-2.5  8. Ascites s/p previous paracentesis  Plan/Discussion:    UO great on Lasix TID yesterday.  CVP down to 10.    Back off on IV lasix to 80 mg once more then stop.   Coox 38.6% this am.  Stat repeat pending. If <55% will likely need milrinone.  May be able to wean after diuresis completed, but suspect he will need for home.   Marked increase in PI events with pump speed from 5900 -> 6000. MAPs stable. FOllow.   ABD [place] without  ascites.   I reviewed the LVAD parameters from today, and compared the results to the patient's prior recorded data.  No programming changes were made.  The LVAD is functioning within specified parameters.  The patient performs LVAD self-test daily.  LVAD interrogation was negative for any significant power changes or alarms.   LVAD equipment check completed and is in good working order.  Back-up equipment present.   LVAD education done on emergency procedures and precautions and reviewed exit site care.   Length of Stay: 2  Annamaria Helling 12/02/2016, 7:31 AM  VAD Team --- VAD ISSUES ONLY--- Pager [phone] (7am - 7am)  Advanced Heart Failure Team  Pager [phone] (M-F; 7a - 4p)  Please contact [facility] for night-coverage after hours (4p -7a ) and weekends on [email or web address]  Patient seen and examined with Oda Kilts, PA-C. We discussed all aspects of the encounter. I agree with the assessment and plan as stated above.   Results of ramp echo from yesterday reviewed with him. LVAD speed adjusted. But now with more PI events. May need to decrease back down. Volumes status now much improved with diuresis. Will continue IV diuresis at least one more day. Renal function and electrolytes ok. Co-ox low this am but normal on recheck. Hopefully will not need home milrinone. Will ambulate.   Analiese Krupka,MD 2:07  PM

## 2016-12-02 NOTE — Progress Notes (Addendum)
CVP 12.   Will repeat dose of 80 mg IV lasix this evening.    Repeat coox 43%.  Pt very reluctant to resume milrinone, but feeling worse this afternoon.  Decreased pump speed back to 5900.   Will diurese one more day and leave off milrinone for now. If coox remains low in am, will likely need to consider. Pt aware.    Legrand Como [address]" [place], PA-C 12/02/2016 2:13 PM

## 2016-12-02 NOTE — Progress Notes (Signed)
ANTICOAGULATION CONSULT NOTE - Follow Up Consult  Pharmacy Consult for Coumadin; add Heparin Indication: LVAD  Allergies  Allergen Reactions  . Codeine Nausea And Vomiting  . Lipitor [Atorvastatin] Nausea Only    Nausea with high doses, tolerates 20mg  dose (08/22/16)    Patient Measurements: Height: 5\' 10"  (177.8 cm) Weight: 228 lb 9.6 oz (103.7 kg) IBW/kg (Calculated) : 73  Heparin dosing weight: 95kg   Vital Signs: Temp: 98.5 F (36.9 C) (03/01 1900) Temp Source: Oral (03/01 1900) BP: 74/60 (03/01 2000)  Labs:  Recent Labs  11/30/16 1044  11/30/16 1158 12/01/16 0636 12/02/16 0445 12/02/16 2159  HGB  --   < > 9.1* 9.2* 9.5*  --   HCT  --   --  30.9* 30.9* 31.9*  --   PLT  --   --  175 178 188  --   LABPROT 15.6*  --   --  15.6* 15.6*  --   INR 1.23  --   --  1.23 1.24  --   HEPARINUNFRC  --   --   --   --   --  0.17*  CREATININE  --   --  1.06 1.04 1.05  --   < > = values in this interval not displayed.  Estimated Creatinine Clearance: 90.3 mL/min (by C-G formula based on SCr of 1.05 mg/dL).  Assessment: 61yom continues on coumadin for his LVAD (09/09/16). INR remains below goal at 1.24. Ok to start heparin bridge per Dr. Haroldine Laws since INR not moving even with higher coumadin doses. Hgb low but stable. Continues on amiodarone as pta.  Home dose: 10mg  daily except 7.5mg  Tue/Thu/Sat  First heparin level is low at 0.17 drawn 7 hours after drip started at 1350 units/hr with no bolus.  No bleeding reported. RN reports drip running OK.  Goal of Therapy:   Heparin level 0.3-0.7 INR 2-2.5 Monitor platelets by anticoagulation protocol: Yes   Plan:  Increase heparin drip to 1650 units/hr, no bolus Daily heparin level, INR, CBC  Eudelia Bunch, Pharm.D. BP:[id number] 12/02/2016 10:37 PM

## 2016-12-02 NOTE — Progress Notes (Signed)
ANTICOAGULATION CONSULT NOTE - Follow Up Consult  Pharmacy Consult for Coumadin; add Heparin Indication: LVAD  Allergies  Allergen Reactions  . Codeine Nausea And Vomiting  . Lipitor [Atorvastatin] Nausea Only    Nausea with high doses, tolerates 20mg  dose (08/22/16)    Patient Measurements: Height: 5\' 10"  (177.8 cm) Weight: 228 lb 9.6 oz (103.7 kg) IBW/kg (Calculated) : 73  Heparin dosing weight: 95kg   Vital Signs: Temp: 97.8 F (36.6 C) (03/01 0847) Temp Source: Oral (03/01 0847) BP: 99/88 (03/01 0847)  Labs:  Recent Labs  11/30/16 1044  11/30/16 1158 12/01/16 0636 12/02/16 0445  HGB  --   < > 9.1* 9.2* 9.5*  HCT  --   --  30.9* 30.9* 31.9*  PLT  --   --  175 178 188  LABPROT 15.6*  --   --  15.6* 15.6*  INR 1.23  --   --  1.23 1.24  CREATININE  --   --  1.06 1.04 1.05  < > = values in this interval not displayed.  Estimated Creatinine Clearance: 90.3 mL/min (by C-G formula based on SCr of 1.05 mg/dL).  Assessment: 61yom continues on coumadin for his LVAD (09/09/16). INR remains below goal at 1.24. Ok to start heparin bridge per Dr. Haroldine Laws since INR not moving even with higher coumadin doses. Hgb low but stable. Continues on amiodarone as pta.  Home dose: 10mg  daily except 7.5mg  Tue/Thu/Sat  Goal of Therapy:  Heparin level 0.3-0.7 INR 2-2.5 Monitor platelets by anticoagulation protocol: Yes   Plan:  1) Increase coumadin to 15mg  x 1 2) Start heparin 1350 units/hr, no bolus 3) 6 hour heparin level 4) Daily heparin level, INR, CBC  Deboraha Sprang 12/02/2016,10:45 AM

## 2016-12-03 LAB — BASIC METABOLIC PANEL
Anion gap: 9 (ref 5–15)
BUN: 18 mg/dL (ref 6–20)
CHLORIDE: 92 mmol/L — AB (ref 101–111)
CO2: 32 mmol/L (ref 22–32)
Calcium: 8.6 mg/dL — ABNORMAL LOW (ref 8.9–10.3)
Creatinine, Ser: 1.01 mg/dL (ref 0.61–1.24)
GFR calc Af Amer: >60 mL/min (ref >60)
GFR calc non Af Amer: >60 mL/min (ref >60)
GLUCOSE: 150 mg/dL — AB (ref 65–99)
POTASSIUM: 3.7 mmol/L (ref 3.5–5.1)
Sodium: 133 mmol/L — ABNORMAL LOW (ref 135–145)

## 2016-12-03 LAB — COOXEMETRY PANEL
CARBOXYHEMOGLOBIN: 1.3 % (ref 0.5–1.5)
Carboxyhemoglobin: 1.3 % (ref 0.5–1.5)
METHEMOGLOBIN: 1.1 % (ref 0.0–1.5)
Methemoglobin: 0.8 % (ref 0.0–1.5)
O2 SAT: 61.5 %
O2 Saturation: 54.5 %
TOTAL HEMOGLOBIN: 9.5 g/dL — AB (ref 12.0–16.0)
Total hemoglobin: 9 g/dL — ABNORMAL LOW (ref 12.0–16.0)

## 2016-12-03 LAB — PROTIME-INR
INR: 1.44
Prothrombin Time: 17.7 seconds — ABNORMAL HIGH (ref 11.4–15.2)

## 2016-12-03 LAB — CBC
HEMATOCRIT: 31.4 % — AB (ref 39.0–52.0)
HEMOGLOBIN: 9.3 g/dL — AB (ref 13.0–17.0)
MCH: 24.7 pg — ABNORMAL LOW (ref 26.0–34.0)
MCHC: 29.6 g/dL — AB (ref 30.0–36.0)
MCV: 83.3 fL (ref 78.0–100.0)
Platelets: 188 10*3/uL (ref 150–400)
RBC: 3.77 MIL/uL — ABNORMAL LOW (ref 4.22–5.81)
RDW: 15.9 % — AB (ref 11.5–15.5)
WBC: 6.6 10*3/uL (ref 4.0–10.5)

## 2016-12-03 LAB — GLUCOSE, CAPILLARY
GLUCOSE-CAPILLARY: 135 mg/dL — AB (ref 65–99)
Glucose-Capillary: 127 mg/dL — ABNORMAL HIGH (ref 65–99)
Glucose-Capillary: 214 mg/dL — ABNORMAL HIGH (ref 65–99)
Glucose-Capillary: 139 mg/dL — ABNORMAL HIGH (ref 65–99)

## 2016-12-03 LAB — HEPARIN LEVEL (UNFRACTIONATED)
Heparin Unfractionated: 0.27 IU/mL — ABNORMAL LOW (ref 0.30–0.70)
Heparin Unfractionated: 0.4 IU/mL (ref 0.30–0.70)

## 2016-12-03 LAB — LACTATE DEHYDROGENASE: LDH: 171 U/L (ref 98–192)

## 2016-12-03 MED ORDER — FUROSEMIDE 10 MG/ML IJ SOLN
80.0000 mg | Freq: Once | INTRAMUSCULAR | Status: AC
  Administered 2016-12-03: 80 mg via INTRAVENOUS
  Filled 2016-12-03: qty 8

## 2016-12-03 MED ORDER — WARFARIN SODIUM 10 MG PO TABS
12.5000 mg | ORAL_TABLET | Freq: Once | ORAL | Status: AC
  Administered 2016-12-03: 12.5 mg via ORAL
  Filled 2016-12-03: qty 1

## 2016-12-03 MED ORDER — TORSEMIDE 20 MG PO TABS
40.0000 mg | ORAL_TABLET | Freq: Two times a day (BID) | ORAL | Status: DC
  Administered 2016-12-03 – 2016-12-06 (×6): 40 mg via ORAL
  Filled 2016-12-03 (×7): qty 2

## 2016-12-03 NOTE — Discharge Summary (Signed)
Advanced Heart Failure LVAD Discharge Note  Discharge Summary   Patient ID: Ronald Miller. MRN: [id number], DOB/AGE: [birth year] 60 y.o. Admit date: 11/30/2016 D/C date:    12/06/2016    Primary Discharge Diagnoses:  1. A/C End Stage Biventricular HF     On Milrinone 0.125 mcg 2. 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 DMII 5. PAF 6. H/O L pleural effusion 7. On chronic anticoagulation - INR goal 2-2.5  8. Ascites s/p previous paracentesis  Hospital Course:  Ronald Milleris a 61 y.o.male with history of chronic systolic HF s/p [employer] ICD 2014, CAD s/p CABG in 2000, HTN, Hx of cocaine abuse, Tobacco abuse, Depression, and COPD.   Underwent HM 3 placement 09/09/16. Post-op course complicated by RV failure, volume overload, left pleural effusion and PAF. He was diuresed successfully. Milrinone weaned to off. Underwent ramp echo prior to d/c and speed adjusted. Also underwent left thoracentesis with IR (600 cc off) with moderate improvement in size of effusion. Warfarin loaded. INR 2.0. Co-ox on date of d/c was 52% but he was ambulating well without symptoms.   Admitted from [place] [facility] 11/30/16 with volume overload, DOE, fatigue, and orthopnea. Concern raised for RV failure with previous milrinone requirement as above.  Weight up ~ 15 lbs or more.  PICC line with initial mixed venous saturation 50% indicating low output and CVP 20. Diuresed with IV lasix. Overall diuresed 17 pounds. Once fully diuresed he transitioned to torsemide 40 mg daily.   Ramp echo performed 12/01/16 and speed increased to 5900 -> 6000. Following am Coox down into 40s with frequent PI events so speed turned back down to 5900 and diuresis continued.   On 3/4 mixed venous saturation dropped to 41% so milrinone 0.125 mcg started with improvement noted on mixed venous saturation to 59%.   Hospital course complicated by subtherapeutic INR requiring heparin gtt. Transitioned back to  coumadin once INR was 1.8. He will continue coumadin on his previous home regimen. Pharmacy followed and dosed anticoagulant.    He is being discharged on milrinone 0.125 mcg with [facility] to follow closely. He will continue to be followed closely in the HF/LAVD clinic. Plan to check INR later this week. He is being discharged in stable condition.      LVAD INTERROGATION:  HeartMate 3 LVAD:  Flow 5.6 liters/min, speed 5900, power 4.7, PI 2.8. Less than 10 PI events.  Discharge Weight: 224 pounds.  Discharge Vitals: Blood pressure 90/76, pulse 79, temperature 97.6 F (36.4 C), temperature source Oral, resp. rate 18, height 5\' 10"  (1.778 m), weight 224 lb 11.2 oz (101.9 kg), SpO2 92 %.  Labs: Lab Results  Component Value Date   WBC 6.6 12/06/2016   HGB 9.7 (L) 12/06/2016   HCT 31.1 (L) 12/06/2016   MCV 82.9 12/06/2016   PLT 187 12/06/2016    Recent Labs Lab 11/30/16 1158  12/06/16 0341  NA 137  < > 134*  K 3.5  < > 4.1  CL 94*  < > 93*  CO2 35*  < > 36*  BUN 15  < > 19  CREATININE 1.06  < > 1.17  CALCIUM 8.6*  < > 8.9  PROT 7.1  --   --   BILITOT 0.4  --   --   ALKPHOS 92  --   --   ALT 17  --   --   AST 23  --   --  GLUCOSE 134*  < > 182*  < > = values in this interval not displayed. Lab Results  Component Value Date   CHOL 153 08/15/2013   HDL 37 (L) 08/15/2013   LDLCALC 89 08/15/2013   TRIG 133 08/15/2013   BNP (last 3 results)  Recent Labs  10/07/16 1352 10/13/16 1207 11/30/16 1158  BNP 387.6* 321.5* 565.5*    ProBNP (last 3 results) No results for input(s): PROBNP in the last 8760 hours.   Diagnostic Studies/Procedures   No results found.  Discharge Medications   Allergies as of 12/06/2016      Reactions   Codeine Nausea And Vomiting   Lipitor [atorvastatin] Nausea Only   Nausea with high doses, tolerates 20mg  dose (08/22/16)      Medication List    STOP taking these medications   NOVOLOG FLEXPEN 100 UNIT/ML FlexPen Generic drug:  insulin  aspart Replaced by:  insulin aspart 100 UNIT/ML injection   Potassium Chloride ER 20 MEQ Tbcr Replaced by:  potassium chloride SA 20 MEQ tablet     TAKE these medications   amiodarone 200 MG tablet Commonly known as:  PACERONE Take 1 tablet (200 mg total) by mouth daily.   aspirin 81 MG EC tablet Take 1 tablet (81 mg total) by mouth daily. Start taking on:  12/07/2016   citalopram 10 MG tablet Commonly known as:  CELEXA Take 1 tablet (10 mg total) by mouth daily.   digoxin 0.125 MG tablet Commonly known as:  LANOXIN Take 125 mcg by mouth daily.   docusate sodium 100 MG capsule Commonly known as:  COLACE Take 2 capsules (200 mg total) by mouth daily. Start taking on:  12/07/2016   gabapentin 100 MG capsule Commonly known as:  NEURONTIN Take 2 capsules (200 mg total) by mouth 2 (two) times daily.   HYDROcodone-acetaminophen 5-325 MG tablet Commonly known as:  NORCO/VICODIN   insulin aspart 100 UNIT/ML injection Commonly known as:  novoLOG Inject 10 Units into the skin 3 (three) times daily with meals. Replaces:  NOVOLOG FLEXPEN 100 UNIT/ML FlexPen   Insulin Detemir 100 UNIT/ML Pen Commonly known as:  LEVEMIR Inject 10 Units into the skin 2 (two) times daily at 8 am and 10 pm.   losartan 25 MG tablet Commonly known as:  COZAAR Take 1 tablet (25 mg total) by mouth daily. Start taking on:  12/07/2016   magnesium oxide 400 (241.3 Mg) MG tablet Commonly known as:  MAG-OX Take 1 tablet (400 mg total) by mouth daily. Start taking on:  12/07/2016   milrinone 20 MG/100 ML Soln infusion Commonly known as:  PRIMACOR Inject 12.5875 mcg/min into the vein continuous. Per [facility] pharmacy   pantoprazole 40 MG tablet Commonly known as:  PROTONIX Take 1 tablet (40 mg total) by mouth daily.   potassium chloride SA 20 MEQ tablet Commonly known as:  K-DUR,KLOR-CON Take 1 tablet (20 mEq total) by mouth daily. Replaces:  Potassium Chloride ER 20 MEQ Tbcr   spironolactone 25 MG  tablet Commonly known as:  ALDACTONE Take 1 tablet (25 mg total) by mouth daily. Start taking on:  12/07/2016   torsemide 20 MG tablet Commonly known as:  DEMADEX Take 2 tablets (40 mg total) by mouth daily. What changed:  when to take this   warfarin 5 MG tablet Commonly known as:  COUMADIN Take 1.5 tablets (7.5 mg total) by mouth daily. And as prescribed on blood checks What changed:  how much to take  additional instructions  Durable Medical Equipment        Start     Ordered   12/06/16 0730  Heart failure home health orders  (Heart failure home health orders / Face to face)  Once    Comments:  Heart Failure Follow-up Care:  Verify follow-up appointments per Patient Discharge Instructions. Confirm transportation arranged. Reconcile home medications with discharge medication list. Remove discontinued medications from use. Assist patient/caregiver to manage medications using pill box. Reinforce low sodium food selection Assessments: Vital signs and oxygen saturation at each visit. Assess home environment for safety concerns, caregiver support and availability of low-sodium foods. Consult Education officer, museum, PT/OT, Dietitian, and CNA based on assessments. Perform comprehensive cardiopulmonary assessment. Notify MD for any change in condition or weight gain of 3 pounds in one day or 5 pounds in one week with symptoms. Daily Weights and Symptom Monitoring: Ensure patient has access to scales. Teach patient/caregiver to weigh daily before breakfast and after voiding using same scale and record.    Teach patient/caregiver to track weight and symptoms and when to notify Provider. Activity: Develop individualized activity plan with patient/caregiver.  Milrinone 0.125 mcg/kg/min from [facility] pharamacy  Question Answer Comment  Heart Failure Follow-up Care [facility] ([facility] at [place] Visits Set up telemonitoring equipment to monitor daily  vital signs, weights and oxygen saturation   Obtain the following labs Basic Metabolic Panel   Lab frequency Weekly   Fax lab results to AHF Clinic at [phone]   Diet Low Sodium Heart Healthy   Fluid restrictions: 2000 mL Fluid   Initiate Heart Failure Clinic Diuretic Protocol to be used by [place] only ( to be ordered by Heart Failure Team Providers Only) Yes      12/06/16 0731   12/03/16 0833  Heart failure home health orders  (Heart failure home health orders / Face to face)  Once    Comments:  Heart Failure Follow-up Care:  Verify follow-up appointments per Patient Discharge Instructions. Confirm transportation arranged. Reconcile home medications with discharge medication list. Remove discontinued medications from use. Assist patient/caregiver to manage medications using pill box. Reinforce low sodium food selection Assessments: Vital signs and oxygen saturation at each visit. Assess home environment for safety concerns, caregiver support and availability of low-sodium foods. Consult Education officer, museum, PT/OT, Dietitian, and CNA based on assessments. Perform comprehensive cardiopulmonary assessment. Notify MD for any change in condition or weight gain of 3 pounds in one day or 5 pounds in one week with symptoms. Daily Weights and Symptom Monitoring: Ensure patient has access to scales. Teach patient/caregiver to weigh daily before breakfast and after voiding using same scale and record.    Teach patient/caregiver to track weight and symptoms and when to notify Provider. Activity: Develop individualized activity plan with patient/caregiver.  Pt to go home with PICC for repeat Coox in clinic next week if home over weekend  Question Answer Comment  Heart Failure Follow-up Care Advanced Heart Failure (AHF) Clinic at [phone]   Obtain the following labs Basic Metabolic Panel   Lab frequency Other see comments   Fax lab results to AHF Clinic at [phone]   Diet  Low Sodium Heart Healthy   Fluid restrictions: 2000 mL Fluid      12/03/16 0836      Disposition   The patient will be discharged in stable condition to home. Discharge Instructions    Diet - low sodium heart healthy    Complete by:  As directed  Heart Failure patients record your daily weight using the same scale at the same time of day    Complete by:  As directed    INR  Goal: 2 - 2.5    Complete by:  As directed    Goal:  2 - 2.5   Increase activity slowly    Complete by:  As directed    Speed Settings:    Complete by:  As directed    Fixed 5900 RPM Low 5600 RPM     Follow-up Information    [facility] Follow up on 12/08/2016.   Specialty:  Cardiology Why:  at 0945 for labs. Code for parking is 5002. Contact information: [address] [id number] [place]       Glori Bickers, MD Follow up on 12/14/2016.   Specialty:  Cardiology Why:  at 1200 for post hospital follow up. Please bring all of your medications with you to your visit. Code for parking is 5002. Contact information: [address] [place] [address] [phone]             Duration of Discharge Encounter: Greater than 35 minutes   Signed, Darrick Grinder, NP  12/06/2016, 12:09 PM

## 2016-12-03 NOTE — Progress Notes (Signed)
ANTICOAGULATION CONSULT NOTE - Follow Up Consult  Pharmacy Consult for Coumadin and Heparin Indication: LVAD  Allergies  Allergen Reactions  . Codeine Nausea And Vomiting  . Lipitor [Atorvastatin] Nausea Only    Nausea with high doses, tolerates 20mg  dose (08/22/16)    Patient Measurements: Height: 5\' 10"  (177.8 cm) Weight: 228 lb (103.4 kg) IBW/kg (Calculated) : 73  Heparin dosing weight: 95kg   Vital Signs: Temp: 98.2 F (36.8 C) (03/02 1137) Temp Source: Oral (03/02 1137) BP: 97/79 (03/02 1137)  Labs:  Recent Labs  12/01/16 0636 12/02/16 0445 12/02/16 2159 12/03/16 0346 12/03/16 1230  HGB 9.2* 9.5*  --  9.3*  --   HCT 30.9* 31.9*  --  31.4*  --   PLT 178 188  --  188  --   LABPROT 15.6* 15.6*  --  17.7*  --   INR 1.23 1.24  --  1.44  --   HEPARINUNFRC  --   --  0.17* 0.27* 0.40  CREATININE 1.04 1.05  --  1.01  --     Estimated Creatinine Clearance: 93.7 mL/min (by C-G formula based on SCr of 1.01 mg/dL).  Medications: Heparin @ 1800 units/hr  Assessment: 61yom continues on coumadin for his LVAD (09/09/16). Heparin bridge added yesterday. Heparin level therapeutic at 0.40. INR starting to finally move but remains below goal 1.4. Continues on amiodarone as pta. CBC stable. No bleeding.  Home dose: 10mg  daily except 7.5mg  Tue/Thu/Sat  Goal of Therapy:  Heparin level 0.3-0.7 INR 2-2.5 Monitor platelets by anticoagulation protocol: Yes   Plan:  1) Continue heparin at 1800 units/hr 2) Coumadin 12.5mg  x 1 3) Daily heparin level, INR, CBC  Deboraha Sprang 12/03/2016,1:40 PM

## 2016-12-03 NOTE — Progress Notes (Signed)
[facility] for Heparin Indication: LVAD  Allergies  Allergen Reactions  . Codeine Nausea And Vomiting  . Lipitor [Atorvastatin] Nausea Only    Nausea with high doses, tolerates 20mg  dose (08/22/16)    Patient Measurements: Height: 5\' 10"  (177.8 cm) Weight: 228 lb (103.4 kg) IBW/kg (Calculated) : 73  Heparin dosing weight: 95kg   Vital Signs: Temp: 98.5 F (36.9 C) (03/01 1900) Temp Source: Oral (03/01 1900) BP: 74/60 (03/01 2000)  Labs:  Recent Labs  11/30/16 1158 12/01/16 0636 12/02/16 0445 12/02/16 2159 12/03/16 0346  HGB 9.1* 9.2* 9.5*  --  9.3*  HCT 30.9* 30.9* 31.9*  --  31.4*  PLT 175 178 188  --  188  LABPROT  --  15.6* 15.6*  --  17.7*  INR  --  1.23 1.24  --  1.44  HEPARINUNFRC  --   --   --  0.17* 0.27*  CREATININE 1.06 1.04 1.05  --   --     Estimated Creatinine Clearance: 90.2 mL/min (by C-G formula based on SCr of 1.05 mg/dL).  Assessment: 61 y.o. male with LVAD ad subtherapeutic INR for heparin   Goal of Therapy:  Heparin level 0.3-0.7 INR 2-2.5 Monitor platelets by anticoagulation protocol: Yes   Plan:  Increase Heparin 1800 units/hr  Phillis Knack, PharmD, BCPS  12/03/2016 4:44 AM

## 2016-12-03 NOTE — Progress Notes (Signed)
Advanced Heart Failure VAD Team HM-3 Note  Subjective:    Admitted from clinic 11/30/16 with worsening fatigue and DOE. Weight up at least 15 lbs.  PICC placed for Coox and CVP with RV failure post VAD.     Coox 54.5% this am (Repeat after 61.5%). CVP 13-14. Out 2.6 L weight unchanged.   Feeling somewhat better this morning. Anxious to see if he will need milrinone.   Pump turned up to 6000 12/01/16. Marked increase in PI events. Turned back down to 5900 yesterday with low coox.   ABD [place] 12/01/16 with no ascites appreciated.  LVAD INTERROGATION:  HeartMate 3 LVAD:  Flow 5.4 liters/min, speed 5900, power 4.6, PI 2.6. Continues to have frequent PI events, but decreased from speed of 6000.   Objective:    Vital Signs:   Temp:  [97.2 F (36.2 C)-98.5 F (36.9 C)] 98.5 F (36.9 C) (03/01 1900) Resp:  [18] 18 (03/01 1200) BP: (74-99)/(60-88) 74/60 (03/01 2000) SpO2:  [92 %] 92 % (03/01 1200) Weight:  [228 lb (103.4 kg)] 228 lb (103.4 kg) (03/02 0358) Last BM Date: 11/30/16 Mean arterial Pressure 82  Intake/Output:   Intake/Output Summary (Last 24 hours) at 12/03/16 0736 Last data filed at 12/03/16 0700  Gross per 24 hour  Intake          1689.88 ml  Output             4375 ml  Net         -2685.12 ml     Physical Exam: General:  Well appearing. Lying in bed.  HEENT: Normal Neck: supple. JVP 11-12 cm. Carotids 2+ bilat; no bruits. No lymphadenopathy or thyromegaly appreciated. Cor: Mechanical heart sounds with LVAD hum present. Lungs: Slightly decreased basilar sounds. Abdomen: Soft, mild/mod distention, mildly tender, no HSM. No bruits or masses. +BS  Driveline: C/D/I; securement device intact and driveline incorporated Extremities: no cyanosis, clubbing, rash, trace ankle edema at most.  Neuro: alert & orientedx3, cranial nerves grossly intact. moves all 4 extremities w/o difficulty. Affect flat but appropriate  Telemetry: Personally reviewed, V paced 90s     Labs: Basic Metabolic Panel:  Recent Labs Lab 11/30/16 1158 12/01/16 0636 12/02/16 0445 12/03/16 0346  NA 137 138 137 133*  K 3.5 3.6 3.8 3.7  CL 94* 92* 94* 92*  CO2 35* 36* 36* 32  GLUCOSE 134* 213* 131* 150*  BUN 15 18 18 18   CREATININE 1.06 1.04 1.05 1.01  CALCIUM 8.6* 8.5* 8.6* 8.6*    Liver Function Tests:  Recent Labs Lab 11/30/16 1158  AST 23  ALT 17  ALKPHOS 92  BILITOT 0.4  PROT 7.1  ALBUMIN 3.4*   No results for input(s): LIPASE, AMYLASE in the last 168 hours. No results for input(s): AMMONIA in the last 168 hours.  CBC:  Recent Labs Lab 11/30/16 1158 12/01/16 0636 12/02/16 0445 12/03/16 0346  WBC 7.4 6.4 7.0 6.6  HGB 9.1* 9.2* 9.5* 9.3*  HCT 30.9* 30.9* 31.9* 31.4*  MCV 85.6 84.7 83.9 83.3  PLT 175 178 188 188    INR:  Recent Labs Lab 11/30/16 1044 12/01/16 0636 12/02/16 0445 12/03/16 0346  INR 1.23 1.23 1.24 1.44    Other results:  EKG:   Imaging: US Abdomen Limited  Result Date: 12/01/2016 CLINICAL DATA:  Evaluate for the presence of ascites EXAM: LIMITED ABDOMEN ULTRASOUND FOR ASCITES TECHNIQUE: Limited ultrasound survey for ascites was performed in all four abdominal quadrants. COMPARISON:  None. FINDINGS: Ultrasound over  all 4 quadrants was performed. No ascites is seen. There is incidental right pleural effusion noted. IMPRESSION: No ascites.  Incidental right pleural effusion. Electronically Signed   By: Ivar Drape M.D.   On: 12/01/2016 16:58     Medications:     Scheduled Medications: . amiodarone  200 mg Oral Daily  . aspirin EC  81 mg Oral Daily  . atorvastatin  20 mg Oral QHS  . citalopram  10 mg Oral Daily  . dextromethorphan-guaiFENesin  1 tablet Oral BID  . docusate sodium  200 mg Oral Daily  . gabapentin  200 mg Oral BID  . insulin aspart  10 Units Subcutaneous TID WC  . insulin detemir  10 Units Subcutaneous BID AC & HS  . losartan  25 mg Oral Daily  . magnesium oxide  400 mg Oral Daily  .  multivitamin with minerals  1 tablet Oral Daily  . pantoprazole  40 mg Oral Daily  . potassium chloride SA  20 mEq Oral BID  . sodium chloride flush  10-40 mL Intracatheter Q12H  . spironolactone  25 mg Oral Daily  . Warfarin - Pharmacist Dosing Inpatient   Does not apply q1800    Infusions: . heparin 1,800 Units/hr (12/03/16 0454)    PRN Medications: acetaminophen, albuterol, baclofen, ondansetron (ZOFRAN) IV, senna, sodium chloride flush, traMADol, zolpidem   Assessment:   1. A/C End Stage Biventricular HF 2. 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 DMII 5. PAF 6. H/O L pleural effusion 7. On chronic anticoagulation - INR goal 2-2.5  8. Ascites s/p previous paracentesis  Plan/Discussion:    Good UO but weight unchanged and CVP remains ~13 with RV failure.   Give IV lasix 80 mg at least this am, as output seems to be improving with diuresis. Reinforced fluid restriction.   Coox marginal at 54%. Certainly a good candidate for milrinone.  If pt leaves off milrinone, would leave PICC in place and recheck coox in clinic x 1 week.    PI event frequency improving with pump speed turned back down. Continue to follow.    Ambulate  ABD [place] without ascites.   I reviewed the LVAD parameters from today, and compared the results to the patient's prior recorded data.  No programming changes were made.  The LVAD is functioning within specified parameters.  The patient performs LVAD self-test daily.  LVAD interrogation was negative for any significant power changes or alarms.   LVAD equipment check completed and is in good working order.  Back-up equipment present.   LVAD education done on emergency procedures and precautions and reviewed exit site care.   Length of Stay: 3  Annamaria Helling 12/03/2016, 7:36 AM  VAD Team --- VAD ISSUES ONLY--- Pager [phone] (7am - 7am)  Advanced Heart Failure Team  Pager [phone] (M-F; 7a - 4p)  Please contact [facility] for night-coverage after hours (4p -7a ) and weekends on [email or web address]  Patient seen and examined with Oda Kilts, PA-C. We discussed all aspects of the encounter. I agree with the assessment and plan as stated above.   Feels much better. Diuresing well but CVP still up some. Co-ox labile. Currently marginal at 54% but he would like to avoid milrinone if possible and based on echo I think RV is good enough to avoid. LVAD speed turned back down to 5900 (was 5900 on admit and turned to 6000 with ramp) due to PI events.  INR low today.   Will continue to diurese. Follow co-ox. Home when INR 1.8 or greater. Leave PICC in at discharge. If feeling ok in clinic will d/c.   Renal function and electrolytes ok. MAP stable.   Bensimhon, Daniel,MD 8:46 AM

## 2016-12-04 DIAGNOSIS — I5023 Acute on chronic systolic (congestive) heart failure: Secondary | ICD-10-CM

## 2016-12-04 DIAGNOSIS — I509 Heart failure, unspecified: Secondary | ICD-10-CM

## 2016-12-04 DIAGNOSIS — Z95811 Presence of heart assist device: Secondary | ICD-10-CM

## 2016-12-04 LAB — COOXEMETRY PANEL
Carboxyhemoglobin: 1.8 % — ABNORMAL HIGH (ref 0.5–1.5)
Methemoglobin: 1 % (ref 0.0–1.5)
O2 Saturation: 68.5 %
Total hemoglobin: 9 g/dL — ABNORMAL LOW (ref 12.0–16.0)

## 2016-12-04 LAB — BASIC METABOLIC PANEL
ANION GAP: 9 (ref 5–15)
BUN: 17 mg/dL (ref 6–20)
CO2: 34 mmol/L — ABNORMAL HIGH (ref 22–32)
Calcium: 8.7 mg/dL — ABNORMAL LOW (ref 8.9–10.3)
Chloride: 93 mmol/L — ABNORMAL LOW (ref 101–111)
Creatinine, Ser: 1.05 mg/dL (ref 0.61–1.24)
Glucose, Bld: 169 mg/dL — ABNORMAL HIGH (ref 65–99)
POTASSIUM: 3.8 mmol/L (ref 3.5–5.1)
SODIUM: 136 mmol/L (ref 135–145)

## 2016-12-04 LAB — CBC
HEMATOCRIT: 30.1 % — AB (ref 39.0–52.0)
HEMOGLOBIN: 9.1 g/dL — AB (ref 13.0–17.0)
MCH: 25.2 pg — ABNORMAL LOW (ref 26.0–34.0)
MCHC: 30.2 g/dL (ref 30.0–36.0)
MCV: 83.4 fL (ref 78.0–100.0)
Platelets: 183 10*3/uL (ref 150–400)
RBC: 3.61 MIL/uL — AB (ref 4.22–5.81)
RDW: 15.8 % — AB (ref 11.5–15.5)
WBC: 5.8 10*3/uL (ref 4.0–10.5)

## 2016-12-04 LAB — GLUCOSE, CAPILLARY
GLUCOSE-CAPILLARY: 124 mg/dL — AB (ref 65–99)
GLUCOSE-CAPILLARY: 117 mg/dL — AB (ref 65–99)
Glucose-Capillary: 202 mg/dL — ABNORMAL HIGH (ref 65–99)
Glucose-Capillary: 119 mg/dL — ABNORMAL HIGH (ref 65–99)

## 2016-12-04 LAB — PROTIME-INR
INR: 1.68
PROTHROMBIN TIME: 20 s — AB (ref 11.4–15.2)

## 2016-12-04 LAB — HEPARIN LEVEL (UNFRACTIONATED): HEPARIN UNFRACTIONATED: 0.45 [IU]/mL (ref 0.30–0.70)

## 2016-12-04 LAB — LACTATE DEHYDROGENASE: LDH: 163 U/L (ref 98–192)

## 2016-12-04 MED ORDER — WARFARIN SODIUM 10 MG PO TABS
10.0000 mg | ORAL_TABLET | Freq: Once | ORAL | Status: AC
  Administered 2016-12-04: 10 mg via ORAL
  Filled 2016-12-04: qty 1

## 2016-12-04 MED ORDER — BACITRACIN-NEOMYCIN-POLYMYXIN OINTMENT TUBE
TOPICAL_OINTMENT | Freq: Every day | CUTANEOUS | Status: DC
  Administered 2016-12-05 – 2016-12-06 (×2): via TOPICAL
  Filled 2016-12-04: qty 14.17

## 2016-12-04 MED ORDER — FUROSEMIDE 10 MG/ML IJ SOLN
80.0000 mg | Freq: Once | INTRAMUSCULAR | Status: AC
  Administered 2016-12-04: 80 mg via INTRAVENOUS
  Filled 2016-12-04: qty 8

## 2016-12-04 NOTE — Progress Notes (Signed)
Patient ID: Ronald Miller., male   DOB: 18-Mar-1956, 61 y.o.   MRN: [id number]   Advanced Heart Failure VAD Team HM-3 Note  Subjective:    Admitted from clinic 11/30/16 with worsening fatigue and DOE. Weight up at least 15 lbs.  PICC placed for Coox and CVP with RV failure post VAD.     Pump turned up to 6000 12/01/16. Marked increase in PI events. Turned back down to 5900 with low coox.   Coox 68.5% this am. CVP 13. Very good diuresis yesterday.   Feels ok, ready to go home.    ABD [place] 12/01/16 with no ascites appreciated.  LVAD INTERROGATION:  HeartMate 3 LVAD:  Flow 5.4 liters/min, speed 5900, power 4.6, PI 2.7. Continues to have frequent PI events (17/24 hrs), but decreased from speed of 6000.   Objective:    Vital Signs:   Temp:  [97.7 F (36.5 C)-98.5 F (36.9 C)] 98.5 F (36.9 C) (03/03 0754) Pulse Rate:  [102] 102 (03/03 0400) Resp:  [17-20] 20 (03/03 0400) BP: (95-110)/(58-83) 95/81 (03/03 0400) SpO2:  [90 %-93 %] 90 % (03/03 0400) Weight:  [228 lb 1.6 oz (103.5 kg)] 228 lb 1.6 oz (103.5 kg) (03/03 0500) Last BM Date: 11/30/16 Mean arterial Pressure 80s  Intake/Output:   Intake/Output Summary (Last 24 hours) at 12/04/16 0819 Last data filed at 12/04/16 0700  Gross per 24 hour  Intake             1596 ml  Output             5600 ml  Net            -4004 ml     Physical Exam: General:  Well appearing. In chair.  HEENT: Normal Neck: supple. JVP 12 cm. Carotids 2+ bilat; no bruits. No lymphadenopathy or thyromegaly appreciated. Cor: Mechanical heart sounds with LVAD hum present. Lungs: Slightly decreased basilar sounds. Abdomen: Soft, mild/mod distention, mildly tender, no HSM. No bruits or masses. +BS  Driveline: C/D/I; securement device intact and driveline incorporated Extremities: no cyanosis, clubbing, rash, trace ankle edema at most.  Neuro: alert & orientedx3, cranial nerves grossly intact. moves all 4 extremities w/o difficulty. Affect flat but  appropriate  Telemetry: Personally reviewed, NSR, v-paced  Labs: Basic Metabolic Panel:  Recent Labs Lab 11/30/16 1158 12/01/16 0636 12/02/16 0445 12/03/16 0346 12/04/16 0406  NA 137 138 137 133* 136  K 3.5 3.6 3.8 3.7 3.8  CL 94* 92* 94* 92* 93*  CO2 35* 36* 36* 32 34*  GLUCOSE 134* 213* 131* 150* 169*  BUN 15 18 18 18 17   CREATININE 1.06 1.04 1.05 1.01 1.05  CALCIUM 8.6* 8.5* 8.6* 8.6* 8.7*    Liver Function Tests:  Recent Labs Lab 11/30/16 1158  AST 23  ALT 17  ALKPHOS 92  BILITOT 0.4  PROT 7.1  ALBUMIN 3.4*   No results for input(s): LIPASE, AMYLASE in the last 168 hours. No results for input(s): AMMONIA in the last 168 hours.  CBC:  Recent Labs Lab 11/30/16 1158 12/01/16 0636 12/02/16 0445 12/03/16 0346 12/04/16 0406  WBC 7.4 6.4 7.0 6.6 5.8  HGB 9.1* 9.2* 9.5* 9.3* 9.1*  HCT 30.9* 30.9* 31.9* 31.4* 30.1*  MCV 85.6 84.7 83.9 83.3 83.4  PLT 175 178 188 188 183    INR:  Recent Labs Lab 11/30/16 1044 12/01/16 0636 12/02/16 0445 12/03/16 0346 12/04/16 0406  INR 1.23 1.23 1.24 1.44 1.68    Other results:  EKG:  Imaging: No results found.   Medications:     Scheduled Medications: . amiodarone  200 mg Oral Daily  . aspirin EC  81 mg Oral Daily  . atorvastatin  20 mg Oral QHS  . citalopram  10 mg Oral Daily  . dextromethorphan-guaiFENesin  1 tablet Oral BID  . docusate sodium  200 mg Oral Daily  . furosemide  80 mg Intravenous Once  . gabapentin  200 mg Oral BID  . insulin aspart  10 Units Subcutaneous TID WC  . insulin detemir  10 Units Subcutaneous BID AC & HS  . losartan  25 mg Oral Daily  . magnesium oxide  400 mg Oral Daily  . multivitamin with minerals  1 tablet Oral Daily  . pantoprazole  40 mg Oral Daily  . potassium chloride SA  20 mEq Oral BID  . sodium chloride flush  10-40 mL Intracatheter Q12H  . spironolactone  25 mg Oral Daily  . torsemide  40 mg Oral BID  . warfarin  10 mg Oral ONCE-1800  . Warfarin -  Pharmacist Dosing Inpatient   Does not apply q1800    Infusions: . heparin 1,800 Units/hr (12/03/16 1916)    PRN Medications: acetaminophen, albuterol, baclofen, ondansetron (ZOFRAN) IV, senna, sodium chloride flush, traMADol, zolpidem   Assessment:   1. A/C End Stage Biventricular HF 2. 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 DMII 5. PAF 6. H/O L pleural effusion 7. On chronic anticoagulation - INR goal 2-2.5  8. Ascites s/p previous paracentesis  Plan/Discussion:    Good diuresis yesterday.  CVP remains ~13 with RV failure.  Will give him a dose of Lasix 80 mg IV x 1 again today.  When transitioned to po, will use torsemide 60 mg bid.   Co-ox improved at 68.5% this morning.  He appears to be doing better at a lower pump speed (better RV mechanics most likely).  He will likely be able to go home off milrinone.  Will leave PICC in place and recheck coox in clinic x 1 week.    PI event frequency improving with pump speed turned back down. Continue to follow.    Ambulate, home when INR 1.8 or greater (1.68 today).   ABD [place] without ascites.   I reviewed the LVAD parameters from today, and compared the results to the patient's prior recorded data.  No programming changes were made.  The LVAD is functioning within specified parameters.  The patient performs LVAD self-test daily.  LVAD interrogation was negative for any significant power changes or alarms.   LVAD equipment check completed and is in good working order.  Back-up equipment present.   LVAD education done on emergency procedures and precautions and reviewed exit site care.  Length of Stay: [place], MD 12/04/2016, 8:19 AM  VAD Team --- VAD ISSUES ONLY--- Pager [phone] (7am - 7am)  Advanced Heart Failure Team  Pager [phone] (M-F; 7a - 4p)  Please contact [facility] for night-coverage after hours (4p -7a ) and weekends on [email or web address]

## 2016-12-04 NOTE — Progress Notes (Signed)
ANTICOAGULATION CONSULT NOTE - Follow Up Consult  Pharmacy Consult for Coumadin and Heparin Indication: LVAD  Allergies  Allergen Reactions  . Codeine Nausea And Vomiting  . Lipitor [Atorvastatin] Nausea Only    Nausea with high doses, tolerates 20mg  dose (08/22/16)    Patient Measurements: Height: 5\' 10"  (177.8 cm) Weight: 228 lb 1.6 oz (103.5 kg) IBW/kg (Calculated) : 73  Heparin dosing weight: 95kg   Vital Signs: Temp: 98.5 F (36.9 C) (03/03 0754) Temp Source: Oral (03/03 0754) BP: 95/81 (03/03 0400) Pulse Rate: 102 (03/03 0400)  Labs:  Recent Labs  12/02/16 0445  12/03/16 0346 12/03/16 1230 12/04/16 0406  HGB 9.5*  --  9.3*  --  9.1*  HCT 31.9*  --  31.4*  --  30.1*  PLT 188  --  188  --  183  LABPROT 15.6*  --  17.7*  --  20.0*  INR 1.24  --  1.44  --  1.68  HEPARINUNFRC  --   < > 0.27* 0.40 0.45  CREATININE 1.05  --  1.01  --  1.05  < > = values in this interval not displayed.  Estimated Creatinine Clearance: 90.2 mL/min (by C-G formula based on SCr of 1.05 mg/dL).  Medications: Heparin @ 1800 units/hr  Assessment: 61yom continues on coumadin for his LVAD (09/09/16). Heparin bridge added while INR subtherapeutic. Heparin level at goal at 0.45 today. INR starting to finally move but remains below goal 1.68. Continues on amiodarone as pta. CBC stable. No bleeding.  Home dose: 10mg  daily except 7.5mg  Tue/Thu/Sat  Goal of Therapy:  Heparin level 0.3-0.7 INR 2-2.5 Monitor platelets by anticoagulation protocol: Yes   Plan:  1) Continue heparin at 1800 units/hr 2) Coumadin 10 mg x 1 3) Daily heparin level, INR, CBC  Uvaldo Rising, BCPS  Clinical Pharmacist Pager [phone]  12/04/2016 8:23 AM

## 2016-12-05 LAB — CBC
HEMATOCRIT: 31.2 % — AB (ref 39.0–52.0)
HEMOGLOBIN: 9.3 g/dL — AB (ref 13.0–17.0)
MCH: 24.9 pg — ABNORMAL LOW (ref 26.0–34.0)
MCHC: 29.8 g/dL — AB (ref 30.0–36.0)
MCV: 83.4 fL (ref 78.0–100.0)
Platelets: 195 10*3/uL (ref 150–400)
RBC: 3.74 MIL/uL — ABNORMAL LOW (ref 4.22–5.81)
RDW: 15.9 % — ABNORMAL HIGH (ref 11.5–15.5)
WBC: 7.1 10*3/uL (ref 4.0–10.5)

## 2016-12-05 LAB — COOXEMETRY PANEL
CARBOXYHEMOGLOBIN: 1.2 % (ref 0.5–1.5)
Carboxyhemoglobin: 1.3 % (ref 0.5–1.5)
Carboxyhemoglobin: 1.5 % (ref 0.5–1.5)
METHEMOGLOBIN: 0.8 % (ref 0.0–1.5)
Methemoglobin: 1.2 % (ref 0.0–1.5)
Methemoglobin: 1.3 % (ref 0.0–1.5)
O2 SAT: 44.2 %
O2 SAT: 54.1 %
O2 Saturation: 53.1 %
TOTAL HEMOGLOBIN: 9.6 g/dL — AB (ref 12.0–16.0)
TOTAL HEMOGLOBIN: 10.2 g/dL — AB (ref 12.0–16.0)
Total hemoglobin: 9.9 g/dL — ABNORMAL LOW (ref 12.0–16.0)

## 2016-12-05 LAB — BASIC METABOLIC PANEL
ANION GAP: 7 (ref 5–15)
BUN: 18 mg/dL (ref 6–20)
CHLORIDE: 94 mmol/L — AB (ref 101–111)
CO2: 36 mmol/L — ABNORMAL HIGH (ref 22–32)
Calcium: 8.7 mg/dL — ABNORMAL LOW (ref 8.9–10.3)
Creatinine, Ser: 1.11 mg/dL (ref 0.61–1.24)
GFR calc Af Amer: >60 mL/min (ref >60)
GFR calc non Af Amer: >60 mL/min (ref >60)
GLUCOSE: 142 mg/dL — AB (ref 65–99)
POTASSIUM: 4 mmol/L (ref 3.5–5.1)
Sodium: 137 mmol/L (ref 135–145)

## 2016-12-05 LAB — GLUCOSE, CAPILLARY
GLUCOSE-CAPILLARY: 142 mg/dL — AB (ref 65–99)
GLUCOSE-CAPILLARY: 221 mg/dL — AB (ref 65–99)
Glucose-Capillary: 134 mg/dL — ABNORMAL HIGH (ref 65–99)
Glucose-Capillary: 188 mg/dL — ABNORMAL HIGH (ref 65–99)

## 2016-12-05 LAB — HEPARIN LEVEL (UNFRACTIONATED)
HEPARIN UNFRACTIONATED: 0.53 [IU]/mL (ref 0.30–0.70)
HEPARIN UNFRACTIONATED: 0.41 [IU]/mL (ref 0.30–0.70)

## 2016-12-05 LAB — CARBOXYHEMOGLOBIN - COOX: Carboxyhemoglobin: 1.2 % (ref 0.5–1.5)

## 2016-12-05 LAB — PROTIME-INR
INR: 1.73
Prothrombin Time: 20.4 seconds — ABNORMAL HIGH (ref 11.4–15.2)

## 2016-12-05 LAB — LACTATE DEHYDROGENASE: LDH: 171 U/L (ref 98–192)

## 2016-12-05 MED ORDER — WARFARIN SODIUM 10 MG PO TABS
10.0000 mg | ORAL_TABLET | Freq: Once | ORAL | Status: AC
  Administered 2016-12-05: 10 mg via ORAL
  Filled 2016-12-05: qty 1

## 2016-12-05 MED ORDER — MILRINONE LACTATE IN DEXTROSE 20-5 MG/100ML-% IV SOLN
0.1250 ug/kg/min | INTRAVENOUS | Status: DC
  Administered 2016-12-05 – 2016-12-06 (×2): 0.125 ug/kg/min via INTRAVENOUS
  Filled 2016-12-05 (×2): qty 100

## 2016-12-05 NOTE — Progress Notes (Signed)
ANTICOAGULATION CONSULT NOTE - Follow Up Consult  Pharmacy Consult for Coumadin and Heparin Indication: LVAD  Allergies  Allergen Reactions  . Codeine Nausea And Vomiting  . Lipitor [Atorvastatin] Nausea Only    Nausea with high doses, tolerates 20mg  dose (08/22/16)    Patient Measurements: Height: 5\' 10"  (177.8 cm) Weight: 222 lb 1.6 oz (100.7 kg) IBW/kg (Calculated) : 73  Heparin dosing weight: 95kg   Vital Signs: Temp: 97.5 F (36.4 C) (03/04 1145) Temp Source: Oral (03/04 1145) BP: 66/54 (03/04 1149)  Labs:  Recent Labs  12/03/16 0346  12/04/16 0406 12/05/16 0352 12/05/16 1416  HGB 9.3*  --  9.1* 9.3*  --   HCT 31.4*  --  30.1* 31.2*  --   PLT 188  --  183 195  --   LABPROT 17.7*  --  20.0* 20.4*  --   INR 1.44  --  1.68 1.73  --   HEPARINUNFRC 0.27*  < > 0.45 0.53 0.41  CREATININE 1.01  --  1.05 1.11  --   < > = values in this interval not displayed.  Estimated Creatinine Clearance: 84.2 mL/min (by C-G formula based on SCr of 1.11 mg/dL).  Medications: Heparin @ 1800 units/hr  Assessment: 61yom continues on coumadin for his LVAD (09/09/16). Heparin bridge added while INR subtherapeutic. Heparin level at goal at 0.53 today. INR trending up but remains below goal 1.73. Continues on amiodarone as pta. CBC stable. No bleeding.  Home dose: 10mg  daily except 7.5mg  Tue/Thu/Sat  Concerns for nosebleeds, so level was checked. It was within goal at 0.41  Goal of Therapy:  Heparin level 0.3-0.7 INR 2-2.5 Monitor platelets by anticoagulation protocol: Yes   Plan:  1) Continue heparin at 1800 units/hr 2) Coumadin 10 mg x 1 3) Daily heparin level, INR, CBC  Levester Fresh, PharmD, BCPS, BCCCP Clinical Pharmacist 12/05/2016 3:28 PM

## 2016-12-05 NOTE — Progress Notes (Signed)
ANTICOAGULATION CONSULT NOTE - Follow Up Consult  Pharmacy Consult for Coumadin and Heparin Indication: LVAD  Allergies  Allergen Reactions  . Codeine Nausea And Vomiting  . Lipitor [Atorvastatin] Nausea Only    Nausea with high doses, tolerates 20mg  dose (08/22/16)    Patient Measurements: Height: 5\' 10"  (177.8 cm) Weight: 222 lb 1.6 oz (100.7 kg) IBW/kg (Calculated) : 73  Heparin dosing weight: 95kg   Vital Signs: Temp: 98.2 F (36.8 C) (03/04 0750) Temp Source: Oral (03/04 0750) BP: 97/51 (03/04 0750) Pulse Rate: 82 (03/04 0300)  Labs:  Recent Labs  12/03/16 0346 12/03/16 1230 12/04/16 0406 12/05/16 0352  HGB 9.3*  --  9.1* 9.3*  HCT 31.4*  --  30.1* 31.2*  PLT 188  --  183 195  LABPROT 17.7*  --  20.0* 20.4*  INR 1.44  --  1.68 1.73  HEPARINUNFRC 0.27* 0.40 0.45 0.53  CREATININE 1.01  --  1.05 1.11    Estimated Creatinine Clearance: 84.2 mL/min (by C-G formula based on SCr of 1.11 mg/dL).  Medications: Heparin @ 1800 units/hr  Assessment: 61yom continues on coumadin for his LVAD (09/09/16). Heparin bridge added while INR subtherapeutic. Heparin level at goal at 0.53 today. INR trending up but remains below goal 1.73. Continues on amiodarone as pta. CBC stable. No bleeding.  Home dose: 10mg  daily except 7.5mg  Tue/Thu/Sat  Goal of Therapy:  Heparin level 0.3-0.7 INR 2-2.5 Monitor platelets by anticoagulation protocol: Yes   Plan:  1) Continue heparin at 1800 units/hr 2) Coumadin 10 mg x 1 3) Daily heparin level, INR, CBC  Nena Jordan, PharmD, BCPS  12/05/2016 8:29 AM

## 2016-12-05 NOTE — Progress Notes (Signed)
Received call from staff nurse.    Concern for increased fatigue. CO-OX 44%. Map 70  Repeat CO-OX now. If less than 50% will need to start milrinone 0.125 mcg.   Discussed with Dr Aundra Dubin. Aware and agrees with plan.    Amy Clegg NP-C   3:40 PM

## 2016-12-05 NOTE — Progress Notes (Signed)
Patient ID: Ronald Miller., male   DOB: August 14, 1956, 61 y.o.   MRN: [id number]   Advanced Heart Failure VAD Team HM-3 Note  Subjective:    Admitted from clinic 11/30/16 with worsening fatigue and DOE. Weight up at least 15 lbs.  PICC placed for Coox and CVP with RV failure post VAD.     Pump turned up to 6000 12/01/16. Marked increase in PI events. Turned back down to 5900 with low coox.   Coox 53% this am (has varied a lot). CVP 8. Very good diuresis again yesterday.   Feels ok, ready to go home.  INR 1.7 today.   ABD [place] 12/01/16 with no ascites appreciated.  LVAD INTERROGATION:  HeartMate 3 LVAD:  Flow 5.5 liters/min, speed 5900, power 4.6, PI 2.7. Continues to have frequent PI events  Objective:    Vital Signs:   Temp:  [97.5 F (36.4 C)-98.5 F (36.9 C)] 98.2 F (36.8 C) (03/04 0750) Pulse Rate:  [82-106] 82 (03/04 0300) Resp:  [18] 18 (03/04 0750) BP: (84-97)/(50-76) 97/51 (03/04 0750) SpO2:  [89 %-92 %] 91 % (03/04 0750) Weight:  [222 lb 1.6 oz (100.7 kg)] 222 lb 1.6 oz (100.7 kg) (03/04 0300) Last BM Date: 11/30/16 Mean arterial Pressure 80s  Intake/Output:   Intake/Output Summary (Last 24 hours) at 12/05/16 0809 Last data filed at 12/05/16 0756  Gross per 24 hour  Intake           1200.6 ml  Output             6775 ml  Net          -5574.4 ml     Physical Exam: General:  Well appearing. In chair.  HEENT: Normal Neck: supple. JVP 8 cm. Carotids 2+ bilat; no bruits. No lymphadenopathy or thyromegaly appreciated. Cor: Mechanical heart sounds with LVAD hum present. Lungs: Slightly decreased basilar sounds. Abdomen: Soft, mild/mod distention, mildly tender, no HSM. No bruits or masses. +BS  Driveline: C/D/I; securement device intact and driveline incorporated Extremities: no cyanosis, clubbing, rash, trace ankle edema at most.  Neuro: alert & orientedx3, cranial nerves grossly intact. moves all 4 extremities w/o difficulty. Affect flat but  appropriate  Telemetry: Personally reviewed, NSR, v-paced  Labs: Basic Metabolic Panel:  Recent Labs Lab 12/01/16 0636 12/02/16 0445 12/03/16 0346 12/04/16 0406 12/05/16 0352  NA 138 137 133* 136 137  K 3.6 3.8 3.7 3.8 4.0  CL 92* 94* 92* 93* 94*  CO2 36* 36* 32 34* 36*  GLUCOSE 213* 131* 150* 169* 142*  BUN 18 18 18 17 18   CREATININE 1.04 1.05 1.01 1.05 1.11  CALCIUM 8.5* 8.6* 8.6* 8.7* 8.7*    Liver Function Tests:  Recent Labs Lab 11/30/16 1158  AST 23  ALT 17  ALKPHOS 92  BILITOT 0.4  PROT 7.1  ALBUMIN 3.4*   No results for input(s): LIPASE, AMYLASE in the last 168 hours. No results for input(s): AMMONIA in the last 168 hours.  CBC:  Recent Labs Lab 12/01/16 0636 12/02/16 0445 12/03/16 0346 12/04/16 0406 12/05/16 0352  WBC 6.4 7.0 6.6 5.8 7.1  HGB 9.2* 9.5* 9.3* 9.1* 9.3*  HCT 30.9* 31.9* 31.4* 30.1* 31.2*  MCV 84.7 83.9 83.3 83.4 83.4  PLT 178 188 188 183 195    INR:  Recent Labs Lab 12/01/16 0636 12/02/16 0445 12/03/16 0346 12/04/16 0406 12/05/16 0352  INR 1.23 1.24 1.44 1.68 1.73    Other results:  EKG:   Imaging: No results  found.   Medications:     Scheduled Medications: . amiodarone  200 mg Oral Daily  . aspirin EC  81 mg Oral Daily  . atorvastatin  20 mg Oral QHS  . citalopram  10 mg Oral Daily  . dextromethorphan-guaiFENesin  1 tablet Oral BID  . docusate sodium  200 mg Oral Daily  . gabapentin  200 mg Oral BID  . insulin aspart  10 Units Subcutaneous TID WC  . insulin detemir  10 Units Subcutaneous BID AC & HS  . losartan  25 mg Oral Daily  . magnesium oxide  400 mg Oral Daily  . multivitamin with minerals  1 tablet Oral Daily  . neomycin-bacitracin-polymyxin   Topical Daily  . pantoprazole  40 mg Oral Daily  . potassium chloride SA  20 mEq Oral BID  . sodium chloride flush  10-40 mL Intracatheter Q12H  . spironolactone  25 mg Oral Daily  . torsemide  40 mg Oral BID  . Warfarin - Pharmacist Dosing Inpatient    Does not apply q1800    Infusions: . heparin 1,800 Units/hr (12/05/16 0402)    PRN Medications: acetaminophen, albuterol, baclofen, ondansetron (ZOFRAN) IV, senna, sodium chloride flush, traMADol, zolpidem   Assessment:   1. A/C End Stage Biventricular HF 2. 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 DMII 5. PAF 6. H/O L pleural effusion 7. On chronic anticoagulation - INR goal 2-2.5  8. Ascites s/p previous paracentesis  Plan/Discussion:    Good diuresis yesterday.  CVP down to 8 and weight down.  He has significant RV failure.  Will plan on sending him home on torsemide 40 mg bid (was on 40 mg daily prior to admission).    Overall, he appears to be doing better at a lower pump speed (better RV mechanics most likely).  He will likely be able to go home off milrinone.  Will leave PICC in place and recheck coox in clinic x 1 week.  Co-ox was 53% today but has been up and down.  Will repeat co-ox tomorrow morning before discharge.   He still has frequent PI events even with pump speed decreased to 5900. Continue to follow.    Ambulate, home when INR 1.8 or greater (1.7 today, hopefully will be tomorrow).   ABD [place] without ascites.   I reviewed the LVAD parameters from today, and compared the results to the patient's prior recorded data.  No programming changes were made.  The LVAD is functioning within specified parameters.  The patient performs LVAD self-test daily.  LVAD interrogation was negative for any significant power changes or alarms.   LVAD equipment check completed and is in good working order.  Back-up equipment present.   LVAD education done on emergency procedures and precautions and reviewed exit site care.  Length of Stay: [place], MD 12/05/2016, 8:09 AM  VAD Team --- VAD ISSUES ONLY--- Pager [phone] (7am - 7am)  Advanced Heart Failure Team  Pager [phone] (M-F; 7a - 4p)  Please contact [facility] for night-coverage after hours  (4p -7a ) and weekends on [email or web address]

## 2016-12-06 LAB — CBC
HEMATOCRIT: 31.1 % — AB (ref 39.0–52.0)
HEMOGLOBIN: 9.7 g/dL — AB (ref 13.0–17.0)
MCH: 25.9 pg — ABNORMAL LOW (ref 26.0–34.0)
MCHC: 31.2 g/dL (ref 30.0–36.0)
MCV: 82.9 fL (ref 78.0–100.0)
Platelets: 187 10*3/uL (ref 150–400)
RBC: 3.75 MIL/uL — AB (ref 4.22–5.81)
RDW: 16.2 % — ABNORMAL HIGH (ref 11.5–15.5)
WBC: 6.6 10*3/uL (ref 4.0–10.5)

## 2016-12-06 LAB — BASIC METABOLIC PANEL
ANION GAP: 5 (ref 5–15)
BUN: 19 mg/dL (ref 6–20)
CO2: 36 mmol/L — ABNORMAL HIGH (ref 22–32)
Calcium: 8.9 mg/dL (ref 8.9–10.3)
Chloride: 93 mmol/L — ABNORMAL LOW (ref 101–111)
Creatinine, Ser: 1.17 mg/dL (ref 0.61–1.24)
GFR calc non Af Amer: >60 mL/min (ref >60)
GLUCOSE: 182 mg/dL — AB (ref 65–99)
POTASSIUM: 4.1 mmol/L (ref 3.5–5.1)
Sodium: 134 mmol/L — ABNORMAL LOW (ref 135–145)

## 2016-12-06 LAB — GLUCOSE, CAPILLARY
Glucose-Capillary: 144 mg/dL — ABNORMAL HIGH (ref 65–99)
Glucose-Capillary: 174 mg/dL — ABNORMAL HIGH (ref 65–99)

## 2016-12-06 LAB — COOXEMETRY PANEL
CARBOXYHEMOGLOBIN: 1.7 % — AB (ref 0.5–1.5)
METHEMOGLOBIN: 0.7 % (ref 0.0–1.5)
O2 SAT: 59.1 %
Total hemoglobin: 12.3 g/dL (ref 12.0–16.0)

## 2016-12-06 LAB — LACTATE DEHYDROGENASE: LDH: 166 U/L (ref 98–192)

## 2016-12-06 LAB — PROTIME-INR
INR: 1.82
PROTHROMBIN TIME: 21.3 s — AB (ref 11.4–15.2)

## 2016-12-06 LAB — HEPARIN LEVEL (UNFRACTIONATED): Heparin Unfractionated: 0.44 IU/mL (ref 0.30–0.70)

## 2016-12-06 MED ORDER — MILRINONE LACTATE IN DEXTROSE 20-5 MG/100ML-% IV SOLN: 0.1250 ug/kg/min | INTRAVENOUS | 6 refills | Status: DC

## 2016-12-06 MED ORDER — POTASSIUM CHLORIDE CRYS ER 20 MEQ PO TBCR: 20.0000 meq | EXTENDED_RELEASE_TABLET | Freq: Every day | ORAL | 6 refills | Status: DC

## 2016-12-06 MED ORDER — SORBITOL 70 % SOLN
30.0000 mL | Freq: Once | Status: DC
  Filled 2016-12-06: qty 30

## 2016-12-06 MED ORDER — MAGNESIUM OXIDE 400 (241.3 MG) MG PO TABS: 400.0000 mg | ORAL_TABLET | Freq: Every day | ORAL | 6 refills | Status: DC

## 2016-12-06 MED ORDER — LOSARTAN POTASSIUM 25 MG PO TABS: 25.0000 mg | ORAL_TABLET | Freq: Every day | ORAL | 6 refills | Status: DC

## 2016-12-06 MED ORDER — INSULIN ASPART 100 UNIT/ML ~~LOC~~ SOLN: 10.0000 [IU] | Freq: Three times a day (TID) | SUBCUTANEOUS | 11 refills | Status: DC

## 2016-12-06 MED ORDER — GABAPENTIN 100 MG PO CAPS: 200.0000 mg | ORAL_CAPSULE | Freq: Two times a day (BID) | ORAL | 6 refills | Status: DC

## 2016-12-06 MED ORDER — SPIRONOLACTONE 25 MG PO TABS: 25.0000 mg | ORAL_TABLET | Freq: Every day | ORAL | 6 refills | Status: DC

## 2016-12-06 MED ORDER — DOCUSATE SODIUM 100 MG PO CAPS: 200.0000 mg | ORAL_CAPSULE | Freq: Every day | ORAL | 0 refills | Status: DC

## 2016-12-06 MED ORDER — POLYETHYLENE GLYCOL 3350 17 G PO PACK
17.0000 g | PACK | Freq: Every day | ORAL | Status: DC
  Filled 2016-12-06: qty 1

## 2016-12-06 MED ORDER — ASPIRIN 81 MG PO TBEC: 81.0000 mg | DELAYED_RELEASE_TABLET | Freq: Every day | ORAL | 6 refills | Status: DC

## 2016-12-06 NOTE — Progress Notes (Signed)
Respiratory Therapy notified Levada Dy, RN that the CoOx completed on 12/05/2016 at 1550 resulted w/O2 sat of 41. This result was communicated to Darrick Grinder, NP for Heart Failure Team and patient was started on IV Milrinone.  Order was placed for carboxyhemoglobin only, instead of CoOx panel; therefore, result of venous blood gas O2 sat of 41 not in the system.

## 2016-12-06 NOTE — Care Management Note (Addendum)
Case Management Note  Patient Details  Name: Ronald Miller. MRN: [id number] Date of Birth: Jun 17, 1956  Subjective/Objective:   Pt admitted with volume overload - s/p LVAD 12/17                 Action/Plan:  Pt alert and oriented during assessment.   PTA from home independent with sister.  Pt denied barriers to returning home.  CM will continue to follow for discharge needs   Expected Discharge Date:                  Expected Discharge Plan:  [place]  In-House Referral:     Discharge planning Services  CM Consult  Post Acute Care Choice:  Durable Medical Equipment Choice offered to:  Patient  DME Arranged:  IV pump/equipment DME Agency:  [place]:  RN [facility] Agency:     Status of Service:  In process, will continue to follow  If discussed at Long Length of Stay Meetings, dates discussed:    Additional Comments: 12/06/2016  CM texted paged NP with HF to request Milrinone order - order received and accepted.  No other CM needs determined prior to discharge  Per attending notes pt will discharge home today.  [facility] has actively been following pt and accepts referral pt for [facility] , HF specific orders (agency reviewed)  and IV milrinone.  Pt has already been active with agency and wants to continue services with them.  Pt denied needing PT or OT in the home - independent in the hospital room  - states he is still completely independent and will discharge home with his sister. Maryclare Labrador, RN 12/06/2016, 11:06 AM

## 2016-12-06 NOTE — Progress Notes (Signed)
LVAD Coordinator Encounter Note:   In to visit with Mr. Ronald Miller. Upset at this time with having to go home on Milrinone. Reports "the heart pump was supposed to fix all of this." Educated regarding difference between the unsupported RV in setting of LV with MCS in place. We had conversations prior to LVAD regarding this dual therapy as well. Emotional support provided; reassured that ultimate goal for him is to re-evaluate option for heart transplantation and if that is still what he would like to pursue than this is a necessary step to preserve the function of other organs, as this is a goal. He verbalized understanding.   Informed of appointment for follow up in [facility] on Wednesday 12/08/16 for labs only. Verbalized understanding and no further needs at this time.   [name], RN VAD Coordinator   Office: [phone] 24/7 Emergency VAD Pager: [phone]

## 2016-12-06 NOTE — Progress Notes (Signed)
Patient ID: Ronald Lowy., male   DOB: [birth year], 61 y.o.   MRN: [id number]   Advanced Heart Failure VAD Team HM-3 Note  Subjective:    Admitted from clinic 11/30/16 with worsening fatigue and DOE. Weight up at least 15 lbs.  PICC placed for Coox and CVP with RV failure post VAD.     Pump turned up to 6000 12/01/16. Marked increase in PI events. Turned back down to 5900 with low coox.  Yesterday CO-OX dropped to 44>41% but not recorded. Milrinone was started  0.125 mcg. Todays CO-OX is up to 59%.  Brisk diuresis noted. INR 1.8   Denies SOB/Orthopnea.   ABD [place] 12/01/16 with no ascites appreciated.  LVAD INTERROGATION:  HeartMate 3 LVAD:  Flow 5.6 liters/min, speed 5900, power 4.7, PI 2.8. Less than 10 PI events.  Objective:    Vital Signs:   Temp:  [97.5 F (36.4 C)-98.8 F (37.1 C)] 98.8 F (37.1 C) (03/05 0400) Pulse Rate:  [76-79] 79 (03/05 0004) Resp:  [16-20] 18 (03/05 0400) BP: (66-97)/(51-75) 85/63 (03/05 0400) SpO2:  [90 %-96 %] 94 % (03/05 0400) Weight:  [224 lb 11.2 oz (101.9 kg)] 224 lb 11.2 oz (101.9 kg) (03/05 0358) Last BM Date: 12/28/16 Mean arterial Pressure 70-80s   Intake/Output:   Intake/Output Summary (Last 24 hours) at 12/06/16 0710 Last data filed at 12/06/16 0700  Gross per 24 hour  Intake          1967.45 ml  Output             4800 ml  Net         -2832.55 ml     Physical Exam: CVP ~10 General:  Well appearing. In bed.  HEENT: Normal Neck: supple. JVP 9-10 cm. Carotids 2+ bilat; no bruits. No lymphadenopathy or thyromegaly appreciated. Cor: Mechanical heart sounds with LVAD hum present. Lungs: Slightly decreased basilar sounds. Abdomen: Soft, mild/mod distention, mildly tender, no HSM. No bruits or masses. +BS  Driveline: C/D/I; securement device intact and driveline incorporated Extremities: no cyanosis, clubbing, rash, edema. RUE PICC.   Neuro: alert & orientedx3, cranial nerves grossly intact. moves all 4 extremities w/o difficulty.  Affect flat but appropriate  Telemetry: Personally reviewed, NSR, v-paced  Labs: Basic Metabolic Panel:  Recent Labs Lab 12/02/16 0445 12/03/16 0346 12/04/16 0406 12/05/16 0352 12/06/16 0341  NA 137 133* 136 137 134*  K 3.8 3.7 3.8 4.0 4.1  CL 94* 92* 93* 94* 93*  CO2 36* 32 34* 36* 36*  GLUCOSE 131* 150* 169* 142* 182*  BUN 18 18 17 18 19   CREATININE 1.05 1.01 1.05 1.11 1.17  CALCIUM 8.6* 8.6* 8.7* 8.7* 8.9    Liver Function Tests:  Recent Labs Lab 11/30/16 1158  AST 23  ALT 17  ALKPHOS 92  BILITOT 0.4  PROT 7.1  ALBUMIN 3.4*   No results for input(s): LIPASE, AMYLASE in the last 168 hours. No results for input(s): AMMONIA in the last 168 hours.  CBC:  Recent Labs Lab 12/02/16 0445 12/03/16 0346 12/04/16 0406 12/05/16 0352 12/06/16 0341  WBC 7.0 6.6 5.8 7.1 6.6  HGB 9.5* 9.3* 9.1* 9.3* 9.7*  HCT 31.9* 31.4* 30.1* 31.2* 31.1*  MCV 83.9 83.3 83.4 83.4 82.9  PLT 188 188 183 195 187    INR:  Recent Labs Lab 12/02/16 0445 12/03/16 0346 12/04/16 0406 12/05/16 0352 12/06/16 0341  INR 1.24 1.44 1.68 1.73 1.82    Other results:  EKG:   Imaging: No  results found.   Medications:     Scheduled Medications: . amiodarone  200 mg Oral Daily  . aspirin EC  81 mg Oral Daily  . atorvastatin  20 mg Oral QHS  . citalopram  10 mg Oral Daily  . dextromethorphan-guaiFENesin  1 tablet Oral BID  . docusate sodium  200 mg Oral Daily  . gabapentin  200 mg Oral BID  . insulin aspart  10 Units Subcutaneous TID WC  . insulin detemir  10 Units Subcutaneous BID AC & HS  . losartan  25 mg Oral Daily  . magnesium oxide  400 mg Oral Daily  . multivitamin with minerals  1 tablet Oral Daily  . neomycin-bacitracin-polymyxin   Topical Daily  . pantoprazole  40 mg Oral Daily  . potassium chloride SA  20 mEq Oral BID  . sodium chloride flush  10-40 mL Intracatheter Q12H  . spironolactone  25 mg Oral Daily  . torsemide  40 mg Oral BID  . Warfarin - Pharmacist  Dosing Inpatient   Does not apply q1800    Infusions: . heparin 1,800 Units/hr (12/06/16 0400)  . milrinone 0.125 mcg/kg/min (12/06/16 0004)    PRN Medications: acetaminophen, albuterol, baclofen, ondansetron (ZOFRAN) IV, senna, sodium chloride flush, traMADol, zolpidem   Assessment:   1. A/C End Stage Biventricular HF 2. 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 DMII 5. PAF 6. H/O L pleural effusion 7. On chronic anticoagulation - INR goal 2-2.5  8. Ascites s/p previous paracentesis  Plan/Discussion:    Yesterday CO-OX was down to 44% with repeat 41%. Milrinone 0.125 mcg started. Todays CO-OX 59%. Home with milrinone 0.125 mcg. Refer to [facility] for home milrinone.    He has significant RV failure.  CVP ~10. Will plan on sending him home on torsemide 40 mg bid (was on 40 mg daily prior to admission).  May need RHC at some point if he has functional decline with volume overload. May need to consider sildenafil.    Continue LVAD speed 5900 (better RV mechanics most likely).   INR 1.8. Stop heparin.   ABD [place] without ascites.   I reviewed the LVAD parameters from today, and compared the results to the patient's prior recorded data.  No programming changes were made.  The LVAD is functioning within specified parameters.  The patient performs LVAD self-test daily.  LVAD interrogation was negative for any significant power changes or alarms.   LVAD equipment check completed and is in good working order.  Back-up equipment present.   LVAD education done on emergency procedures and precautions and reviewed exit site care.  Length of Stay: [place], NP 12/06/2016, 7:10 AM  VAD Team --- VAD ISSUES ONLY--- Pager [phone] (7am - 7am)  Advanced Heart Failure Team  Pager [phone] (M-F; 7a - 4p)  Please contact [facility] for night-coverage after hours (4p -7a ) and weekends on [email or web address]  Patient seen with NP, agree with the above note.  Co-ox consistently low  yesterday, milrinone started and co-ox up to 59%.  Volume status looks good, INR 1.8.  Will send home on milrinone 0.125.  Will determine at followup how long he will need to continue this.  Feels good today, ready for discharge.   Loralie Champagne 12/06/2016 9:31 AM

## 2016-12-07 DIAGNOSIS — I2510 Atherosclerotic heart disease of native coronary artery without angina pectoris: Secondary | ICD-10-CM | POA: Diagnosis not present

## 2016-12-08 ENCOUNTER — Telehealth (HOSPITAL_COMMUNITY): Payer: Self-pay | Admitting: Infectious Diseases

## 2016-12-08 ENCOUNTER — Ambulatory Visit (HOSPITAL_COMMUNITY)
Admission: RE | Admit: 2016-12-08 | Discharge: 2016-12-08 | Disposition: A | Discharge status: Home / Self Care | Payer: Medicare HMO | Source: Ambulatory Visit | Attending: Cardiology | Admitting: Cardiology

## 2016-12-08 DIAGNOSIS — Z87898 Personal history of other specified conditions: Secondary | ICD-10-CM | POA: Diagnosis not present

## 2016-12-08 DIAGNOSIS — Z95811 Presence of heart assist device: Secondary | ICD-10-CM | POA: Diagnosis not present

## 2016-12-08 DIAGNOSIS — F1911 Other psychoactive substance abuse, in remission: Secondary | ICD-10-CM

## 2016-12-08 LAB — COOXEMETRY PANEL
Carboxyhemoglobin: 1.2 % (ref 0.5–1.5)
METHEMOGLOBIN: 1.1 % (ref 0.0–1.5)
O2 SAT: 55.2 %
Total hemoglobin: 10.6 g/dL — ABNORMAL LOW (ref 12.0–16.0)

## 2016-12-08 LAB — PROTIME-INR
INR: 1.35
PROTHROMBIN TIME: 16.8 s — AB (ref 11.4–15.2)

## 2016-12-08 NOTE — Telephone Encounter (Signed)
Called re: missed lab appt today. LVM to call back to reschedule if he cannot make it today.   [name], RN VAD Coordinator   Office: [phone] 24/7 Emergency VAD Pager: [phone]

## 2016-12-09 ENCOUNTER — Ambulatory Visit (HOSPITAL_COMMUNITY): Payer: Self-pay | Admitting: Pharmacist

## 2016-12-09 ENCOUNTER — Telehealth (HOSPITAL_COMMUNITY): Payer: Self-pay | Admitting: Infectious Diseases

## 2016-12-09 DIAGNOSIS — Z95811 Presence of heart assist device: Secondary | ICD-10-CM

## 2016-12-09 NOTE — Telephone Encounter (Signed)
A user error has taken place: encounter opened in error, closed for administrative reasons.

## 2016-12-10 ENCOUNTER — Telehealth (HOSPITAL_COMMUNITY): Payer: Self-pay | Admitting: Surgery

## 2016-12-10 LAB — DRUG SCREEN 10 W/CONF, SERUM
Amphetamines, IA: NEGATIVE ng/mL
BARBITURATES, IA: NEGATIVE ug/mL
Benzodiazepines, IA: NEGATIVE ng/mL
COCAINE & METABOLITE, IA: NEGATIVE ng/mL
Methadone, IA: NEGATIVE ng/mL
Opiates, IA: NEGATIVE ng/mL
Oxycodones, IA: NEGATIVE ng/mL
PHENCYCLIDINE, IA: NEGATIVE ng/mL
PROPOXYPHENE, IA: NEGATIVE ng/mL
THC(MARIJUANA) METABOLITE, IA: NEGATIVE ng/mL

## 2016-12-10 NOTE — Telephone Encounter (Signed)
I returned a call to Mr. Lusk concerning his recent labwork.Marland Kitchen He tells me that he has already spoken with Ileene Patrick Pharm D regarding his INR and she reviewed instructions for Coumadin.  He would like to review medications at next appt to be sure that his med list is updated.  I will make an appt note and have asked him to bring all medications to his next appt.

## 2016-12-11 LAB — NICOTINE/COTININE METABOLITES
COTININE: NOT DETECTED ng/mL
Nicotine: NOT DETECTED ng/mL

## 2016-12-13 ENCOUNTER — Other Ambulatory Visit: Payer: Self-pay | Admitting: Internal Medicine

## 2016-12-13 ENCOUNTER — Telehealth (HOSPITAL_COMMUNITY): Payer: Self-pay | Admitting: *Deleted

## 2016-12-13 ENCOUNTER — Other Ambulatory Visit (HOSPITAL_COMMUNITY): Payer: Self-pay | Admitting: *Deleted

## 2016-12-13 ENCOUNTER — Other Ambulatory Visit (HOSPITAL_COMMUNITY): Payer: Medicare HMO

## 2016-12-13 DIAGNOSIS — I509 Heart failure, unspecified: Secondary | ICD-10-CM

## 2016-12-13 DIAGNOSIS — Z452 Encounter for adjustment and management of vascular access device: Secondary | ICD-10-CM

## 2016-12-13 NOTE — Telephone Encounter (Signed)
Patient called reporting his PICC line has been displaced. Plans made to have patient have come in for a new PICC.

## 2016-12-14 ENCOUNTER — Ambulatory Visit (HOSPITAL_COMMUNITY)
Admission: RE | Admit: 2016-12-14 | Discharge: 2016-12-14 | Disposition: A | Discharge status: Home / Self Care | Payer: Medicare HMO | Source: Ambulatory Visit | Attending: Internal Medicine | Admitting: Internal Medicine

## 2016-12-14 ENCOUNTER — Ambulatory Visit (HOSPITAL_COMMUNITY): Payer: Self-pay | Admitting: Pharmacist

## 2016-12-14 ENCOUNTER — Telehealth: Payer: Self-pay | Admitting: Licensed Clinical Social Worker

## 2016-12-14 ENCOUNTER — Encounter (HOSPITAL_COMMUNITY): Payer: Self-pay | Admitting: Interventional Radiology

## 2016-12-14 DIAGNOSIS — I2510 Atherosclerotic heart disease of native coronary artery without angina pectoris: Secondary | ICD-10-CM | POA: Diagnosis not present

## 2016-12-14 DIAGNOSIS — Z79899 Other long term (current) drug therapy: Secondary | ICD-10-CM | POA: Diagnosis not present

## 2016-12-14 DIAGNOSIS — I878 Other specified disorders of veins: Secondary | ICD-10-CM | POA: Insufficient documentation

## 2016-12-14 DIAGNOSIS — Z7901 Long term (current) use of anticoagulants: Secondary | ICD-10-CM | POA: Insufficient documentation

## 2016-12-14 DIAGNOSIS — R188 Other ascites: Secondary | ICD-10-CM

## 2016-12-14 DIAGNOSIS — Z7982 Long term (current) use of aspirin: Secondary | ICD-10-CM | POA: Diagnosis not present

## 2016-12-14 DIAGNOSIS — J449 Chronic obstructive pulmonary disease, unspecified: Secondary | ICD-10-CM | POA: Insufficient documentation

## 2016-12-14 DIAGNOSIS — E119 Type 2 diabetes mellitus without complications: Secondary | ICD-10-CM | POA: Diagnosis not present

## 2016-12-14 DIAGNOSIS — Z452 Encounter for adjustment and management of vascular access device: Secondary | ICD-10-CM | POA: Insufficient documentation

## 2016-12-14 DIAGNOSIS — Z720 Tobacco use: Secondary | ICD-10-CM | POA: Insufficient documentation

## 2016-12-14 DIAGNOSIS — Z9889 Other specified postprocedural states: Secondary | ICD-10-CM | POA: Insufficient documentation

## 2016-12-14 DIAGNOSIS — I5020 Unspecified systolic (congestive) heart failure: Secondary | ICD-10-CM

## 2016-12-14 DIAGNOSIS — I480 Paroxysmal atrial fibrillation: Secondary | ICD-10-CM | POA: Insufficient documentation

## 2016-12-14 DIAGNOSIS — R040 Epistaxis: Secondary | ICD-10-CM | POA: Insufficient documentation

## 2016-12-14 DIAGNOSIS — I110 Hypertensive heart disease with heart failure: Secondary | ICD-10-CM | POA: Diagnosis not present

## 2016-12-14 DIAGNOSIS — Z95811 Presence of heart assist device: Secondary | ICD-10-CM

## 2016-12-14 DIAGNOSIS — F329 Major depressive disorder, single episode, unspecified: Secondary | ICD-10-CM | POA: Diagnosis not present

## 2016-12-14 DIAGNOSIS — J90 Pleural effusion, not elsewhere classified: Secondary | ICD-10-CM | POA: Insufficient documentation

## 2016-12-14 DIAGNOSIS — Z95810 Presence of automatic (implantable) cardiac defibrillator: Secondary | ICD-10-CM | POA: Diagnosis not present

## 2016-12-14 DIAGNOSIS — F419 Anxiety disorder, unspecified: Secondary | ICD-10-CM | POA: Insufficient documentation

## 2016-12-14 DIAGNOSIS — Z794 Long term (current) use of insulin: Secondary | ICD-10-CM | POA: Insufficient documentation

## 2016-12-14 DIAGNOSIS — Z951 Presence of aortocoronary bypass graft: Secondary | ICD-10-CM | POA: Insufficient documentation

## 2016-12-14 DIAGNOSIS — I5023 Acute on chronic systolic (congestive) heart failure: Secondary | ICD-10-CM | POA: Diagnosis not present

## 2016-12-14 DIAGNOSIS — I5022 Chronic systolic (congestive) heart failure: Secondary | ICD-10-CM | POA: Insufficient documentation

## 2016-12-14 DIAGNOSIS — J9811 Atelectasis: Secondary | ICD-10-CM | POA: Insufficient documentation

## 2016-12-14 HISTORY — PX: IR GENERIC HISTORICAL: IMG1180011

## 2016-12-14 LAB — CBC
HEMATOCRIT: 32 % — AB (ref 39.0–52.0)
HEMOGLOBIN: 9.7 g/dL — AB (ref 13.0–17.0)
MCH: 25.3 pg — ABNORMAL LOW (ref 26.0–34.0)
MCHC: 30.3 g/dL (ref 30.0–36.0)
MCV: 83.3 fL (ref 78.0–100.0)
Platelets: 205 10*3/uL (ref 150–400)
RBC: 3.84 MIL/uL — AB (ref 4.22–5.81)
RDW: 16.7 % — ABNORMAL HIGH (ref 11.5–15.5)
WBC: 7.8 10*3/uL (ref 4.0–10.5)

## 2016-12-14 LAB — BASIC METABOLIC PANEL
ANION GAP: 9 (ref 5–15)
BUN: 21 mg/dL — ABNORMAL HIGH (ref 6–20)
CHLORIDE: 93 mmol/L — AB (ref 101–111)
CO2: 32 mmol/L (ref 22–32)
Calcium: 8.6 mg/dL — ABNORMAL LOW (ref 8.9–10.3)
Creatinine, Ser: 1.09 mg/dL (ref 0.61–1.24)
GFR calc non Af Amer: >60 mL/min (ref >60)
Glucose, Bld: 131 mg/dL — ABNORMAL HIGH (ref 65–99)
Potassium: 4 mmol/L (ref 3.5–5.1)
SODIUM: 134 mmol/L — AB (ref 135–145)

## 2016-12-14 LAB — PROTIME-INR
INR: 1.66
Prothrombin Time: 19.8 seconds — ABNORMAL HIGH (ref 11.4–15.2)

## 2016-12-14 LAB — COOXEMETRY PANEL
CARBOXYHEMOGLOBIN: 0.9 % (ref 0.5–1.5)
METHEMOGLOBIN: 1.2 % (ref 0.0–1.5)
O2 Saturation: 46.8 %
TOTAL HEMOGLOBIN: 9.9 g/dL — AB (ref 12.0–16.0)

## 2016-12-14 LAB — LACTATE DEHYDROGENASE: LDH: 207 U/L — AB (ref 98–192)

## 2016-12-14 MED ORDER — HEPARIN SOD (PORK) LOCK FLUSH 100 UNIT/ML IV SOLN
INTRAVENOUS | Status: AC
  Filled 2016-12-14: qty 5

## 2016-12-14 MED ORDER — SILDENAFIL CITRATE 20 MG PO TABS: 20.0000 mg | ORAL_TABLET | Freq: Three times a day (TID) | ORAL | 12 refills | Status: DC

## 2016-12-14 MED ORDER — TRAZODONE HCL 50 MG PO TABS: 50.0000 mg | ORAL_TABLET | Freq: Every day | ORAL | 12 refills | Status: DC

## 2016-12-14 MED ORDER — TORSEMIDE 20 MG PO TABS: 40.0000 mg | ORAL_TABLET | Freq: Two times a day (BID) | ORAL | 12 refills | Status: DC

## 2016-12-14 MED ORDER — LIDOCAINE HCL (PF) 1 % IJ SOLN
INTRAMUSCULAR | Status: AC
  Filled 2016-12-14: qty 30

## 2016-12-14 NOTE — Progress Notes (Signed)
Advanced Heart Failure Clinic Note   Referring Provider: Darylene Price, FNP Primary Care: [facility] Primary Cardiologist: Ronald Miller.   HPI:  Ronald Miller. is a 61 y.o. male with history of chronic systolic HF s/p Medtronic ICD 2014, CAD s/p CABG in 2000, HTN, Hx of cocaine abuse, Tobacco abuse, Depression, and COPD.   Underwent HM 3 placement 09/09/16. Post-op course complicated by RV failure, volume overload, left pleural effusion and PAF. He was diuresed successfully. Milrinone weaned to off. Underwent ramp echo prior to d/c and speed adjusted. Also underwent left thoracentesis with IR (600 cc off) with moderate improvement in size of effusion. Warfarin loaded. INR 2.0. Co-ox on date of d/c was 52% but he was ambulating well without symptoms. PICC line pulled prior to discharge.   In 10/13/16 had repeat thoracentesis.   Hospitalized from 11/30/16-12/06/16 with volume overload, DOE and orthopnea. Weight was up 15 pounds, mixed venous sat was 50% consistent  low output. He was diuresed with IV Lasix then transitioned to torsemide 40mg  daily. Had a ramp echo on 12/01/16, and speed increased to 5900 -> 6000. The following morning his MV sat was down into 40s with frequent PI events so speed turned back down to 5900 and diuresis continued. Milrinone started. He was discharged on 0.150mcg of Milrinone. Discharge weight was 224 pounds.   He presents today for HF follow up. Weight 230 pounds, which is 6 pounds up from his dc weight. Clinically he feels better but still SOB with ADLs. He is taking 60mg  Torsemide daily, no metolazone. Eating a low salt diet, but drinks > 2L a day, especially eats a lot of ice. He expresses a great amount of anxiety and sadness about his health. He is tearful and reports not sleeping more than 2- 3 hours at a time at night. He has tried melatonin and Benadryl. Ambien makes him hallucinate. MV sat today is 46% on 0.125 mcg of Milrinone. Last week's co  ox was 55. He denies tobacco use or other substance abuse since getting VAD. Mother and father participating in his care.  Denies LVAD trauma. Denies driveline trauma, erythema, or drainage. No ICD shocks.   Reports taking Coumadin as prescribed and adherence to anticoagulation based dietary restrictions. Denies bright red blood per rectum or melena, no dark urine or hematuria.     VAD Indication: Destination therapy- evaluation at [facility] in process   VAD interrogation & Equipment Management: Speed:5900 Flow: 5.5 Power:2.7 w PI:4.7  Alarms: no clinical alarms Events: 10/20 events daily  Fixed speed 5900 Low speed limit: 5600 Labs (12/17): K 4.4, creatinine 0.92, LFTs normal, BNP 524, hgb 9.7, LDH 187, INR 1.75  Past Medical History:  Diagnosis Date  . AICD (automatic cardioverter/defibrillator) present   . ASCVD (arteriosclerotic cardiovascular disease)   . Chronic systolic CHF (congestive heart failure) ([place])   . COPD (chronic obstructive pulmonary disease) ([place])   . Coronary artery disease   . Depression   . Diabetes mellitus   . History of cocaine abuse   . Hypertension   . Presence of permanent cardiac pacemaker   . Shortness of breath dyspnea   . Suicidal ideation   . Tobacco abuse     Current Outpatient Prescriptions  Medication Sig Dispense Refill  . amiodarone (PACERONE) 200 MG tablet Take 1 tablet (200 mg total) by mouth daily. 60 tablet 6  . aspirin EC 81 MG EC tablet Take 1 tablet (81 mg total) by mouth daily. 30 tablet  6  . citalopram (CELEXA) 10 MG tablet Take 1 tablet (10 mg total) by mouth daily. 30 tablet 6  . digoxin (LANOXIN) 0.125 MG tablet Take 125 mcg by mouth daily.    Marland Kitchen docusate sodium (COLACE) 100 MG capsule Take 2 capsules (200 mg total) by mouth daily. 10 capsule 0  . gabapentin (NEURONTIN) 100 MG capsule Take 2 capsules (200 mg total) by mouth 2 (two) times daily. 60 capsule 6  . insulin aspart (NOVOLOG) 100 UNIT/ML injection Inject 10  Units into the skin 3 (three) times daily with meals. 10 mL 11  . Insulin Detemir (LEVEMIR) 100 UNIT/ML Pen Inject 10 Units into the skin 2 (two) times daily at 8 am and 10 pm. 15 pen 11  . losartan (COZAAR) 25 MG tablet Take 1 tablet (25 mg total) by mouth daily. 30 tablet 6  . magnesium oxide (MAG-OX) 400 (241.3 Mg) MG tablet Take 1 tablet (400 mg total) by mouth daily. 30 tablet 6  . milrinone (PRIMACOR) 20 MG/100 ML SOLN infusion Inject 12.5875 mcg/min into the vein continuous. Per [facility] pharmacy 100 mL 6  . pantoprazole (PROTONIX) 40 MG tablet Take 1 tablet (40 mg total) by mouth daily. 30 tablet 6  . potassium chloride SA (K-DUR,KLOR-CON) 20 MEQ tablet Take 1 tablet (20 mEq total) by mouth daily. 30 tablet 6  . spironolactone (ALDACTONE) 25 MG tablet Take 1 tablet (25 mg total) by mouth daily. 30 tablet 6  . torsemide (DEMADEX) 20 MG tablet Take 2 tablets (40 mg total) by mouth 2 (two) times daily. 60 tablet 12  . warfarin (COUMADIN) 5 MG tablet Take 1.5 tablets (7.5 mg total) by mouth daily. And as prescribed on blood checks (Patient taking differently: Take 10 mg by mouth daily. 10mg  MON WED FRI SUN 7.5mg  TUES THUR SAT) 60 tablet 11  . HYDROcodone-acetaminophen (NORCO/VICODIN) 5-325 MG tablet     . sildenafil (REVATIO) 20 MG tablet Take 1 tablet (20 mg total) by mouth 3 (three) times daily. 90 tablet 12  . traZODone (DESYREL) 50 MG tablet Take 1 tablet (50 mg total) by mouth at bedtime. 30 tablet 12   No current facility-administered medications for this encounter.    Facility-Administered Medications Ordered in Other Encounters  Medication Dose Route Frequency Provider Last Rate Last Dose  . heparin lock flush 100 UNIT/ML injection           . lidocaine (PF) (XYLOCAINE) 1 % injection             Codeine and Lipitor [atorvastatin]  REVIEW OF SYSTEMS: All systems negative except as listed in HPI, PMH and Problem list.  Vital Signs:  Doppler Pressure 96                Automatc BP:  125/71 (94) HR:83  SPO2:100  %  Weight: 236.6 lb w/ eqt Last weight: 224 lb in hospital Home weights: 230 lbs   Wt Readings from Last 3 Encounters:  12/06/16 224 lb 11.2 oz (101.9 kg)  11/23/16 230 lb 12.8 oz (104.7 kg)  11/19/16 227 lb (103 kg)    Physical Exam: GENERAL: Overall looks good , NAD, does not appear SOB at rest.  HEENT: Normal NECK: Supple, JVP 10 . 2+ bilaterally, no bruits.  No lymphadenopathy or thyromegaly appreciated.   CARDIAC:  Mechanical heart sounds with + LVAD sound.  LUNGS:  CTA. No wheeze ABDOMEN:  firm, Non tender, non distended. No bruits or masses. +BS  LVAD exit site:  Dressing  dry and intact.  No drainage or erytthema. Stabilization device present and adequately applied.  Driveline dressing is being changed daily per sterile technique. EXTREMITIES:  Warm and dry, no cyanosis, clubbing, rash. No edema.  NEUROLOGIC:  Alert and oriented x 4.  Gait steady.  No aphasia.  No dysarthria.  Affect pleasant.  Anxious.   ASSESSMENT AND PLAN:  1. Chronic end-stage biventricular systolic HF: LVEF 73% due to ICM -> s/p Echo 08/24/16 LVEF 15%, RV mild dilated, moderately reduced. 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   -  NYHA III despite VAD support and milrinone at 0.125 - Volume status elevated, will increase torsemide to 40mg  BID, he was taking 40mg  in the am and 20mg  in the pm.  - MV sat 46% today. Will continue milrinone at current dose and add Revatio 20mg  TID. Can increase milrinone as needed. Can consider RHC in the future.  - Can take 5 mg metolazone as needed for weight gain of 3 lbs overnight or 5 lbs within one week.  - VAD parameters stable  - Warfarin with goal INR 2.0 - 2.5 + ASA 81 daily. Avoid lovenox in future due to epistaxis. INR is 1.66, pharmacy is dosing coumadin.  - LDH is 207, stable.  - Consider adding Entresto at some point.  - Had begun work up for Transplant via [employer]. Will need to be free from Tobacco and cocaine x 6 months before work up can  continue. Will continue routine screens.Have discussed need for complete abstinence. Seems to be doing well with this.  2. CAD: No CP. 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  - No change to current plans.  3. DMII:  - Hgb A1C 8.3 on 08/26/2016. No change.  4. Tobacco abuse - Says he has not smoked (or used any drugs) since prior to admission for LVAD. UDS remain negative - Will continue routine cotinine screens and UDS as part of transplant w/u 5. Atrial fibrillation, paroxysmal - NSR on amio.  - Continue Amiodarone 200 mg for now with only recent decrease.  6. Left pleural effusion - underwent repeat thoracentesis in Jan. 2018.  -  Stable L small pleural effusion on 12/01/16 with mild bibasilar atelectasis. 7. Epistaxis -- No further nose bleeds.  -- Hgb 9.7 today. MCV normal.. No change to current plan.  8. Anxiety/Depression - Continue Celexa 10mg  daily  - Add 50mg  Trazadone hs for sleep.   Follow up in one week.    Ronald Leas, NP  12/14/2016   Patient seen and examined with Ronald Booze, NP. We discussed all aspects of the encounter. I agree with the assessment and plan as stated above.   I have edited te note above with my thoughts. He is improved but continues with NYHA III symptoms and low MV sat despite VAD support and milrinone.Will add sildenafil. Can titrate milrinone if symptoms deteriorate. I spoke with Ronald Miller at [facility] transplant clinic today and we discussed Mr. [facility] transplant candidacy. At this point I feel he is a reasonable transplant candidate and think it is appropriate for him to f/u with Ronald Miller in the next several weeks. VAD parameters reviewed personally.   Miller, Daniel,MD 8:04 PM

## 2016-12-14 NOTE — Progress Notes (Signed)
SW Intern met with pt in VAD clinic Pt indicated he was feeling ok and was moving into a new apartment on 12/15/16 he verbalized he was excited and was looking forward to buying a bed. Pt was visibly saddened when he found out he had to stay on milrinone and became tearful, pt indicated he "just felt like giving up" and was tired. SW Intern and Jackie Brennan, LCSW talked to pt about the LVAD buddy program and dicussed connecting pt with an LVAD buddy to provide emotional support. In addition, CSW and Intern will also make periodic phone calls to pt to provide emotional support and encouragement. Pt continues to have negative drug screens but concern with increased depression and new move back to independent living CSW/SW Intern will encourage VAD team for additional support. Pt appears to be depressed over continued medical complications but feeling supported by VAD team. Pt appeared more optimistic by end of visit and motivated for successful reintegration into community and social endeavors. CSW/SW Intern will continue to monitor and provide ongoing support to pt.    SW Intern, Jackie Brennan, LCSW, CCSW-MCS [phone]  

## 2016-12-14 NOTE — Patient Instructions (Signed)
Take torsemide 2 tablets twice daily. Take two and a half coumadin tablets tonight and tomorrow night then resume as previously ordered. Start sildenafil (viagra) tablets as prescribed.  Keep exercising as tolerated!

## 2016-12-14 NOTE — Procedures (Signed)
Successful exchange of a dual lumen PICC line to basilic vein. Length 41cm Tip at lower SVC/RA No complications Ready for use.  Docia Barrier PA-C 11:39 AM

## 2016-12-14 NOTE — Progress Notes (Signed)
Patient presents for 1 month  follow up in [facility] today. Reports no problems with VAD equipment or concerns with drive line. Patient had new PICC placed by IR today. Patient tearful about requiring home Milrinone. States he feels "like giving up". Emotional support given.  Vital Signs:  Doppler Pressure 96   Automatc BP: 125/71 (94) HR:83   SPO2:100  %  Weight: 236.6 lb w/ eqt Last weight: 224 lb in hospital Home weights: 230 lbs   VAD Indication: Destination therapy- evaluation at [facility] in process   VAD interrogation & Equipment Management: Speed:5900 Flow: 5.5 Power:2.7 w    PI:4.7  Alarms: no clinical alarms Events: 10/20 events daily  Fixed speed 5900 Low speed limit: 5600  Primary Controller:  Replace back up battery in 31months. Back up controller:   Replace back up battery in 28 months.  Annual Equipment Maintenance on UBC/PM was performed on December 2017.   I reviewed the LVAD parameters from today and compared the results to the patient's prior recorded data. LVAD interrogation was NEGATIVE for significant power changes, NEGATIVE for clinical alarms and STABLE for PI events/speed drops. No programming changes were made and pump is functioning within specified parameters. Pt is performing daily controller and system monitor self tests along with completing weekly and monthly maintenance for LVAD equipment.  LVAD equipment check completed and is in good working order. Back-up equipment present. Charged back up battery and performed self-test on equipment.   Exit Site Care: Drive line is being maintained weekly  by his sister. Drive line exit site well healed and incorporated. The velour is fully implanted at exit site. Dressing dry and intact. No erythema or drainage. Stabilization device present and accurately applied. Pt denies fever or chills. Pt states they have adequate dressing supplies at home.   Significant Events on VAD Support:  11/30/16>>  Milrinone gtt at discharge   Device:Protecta Therapies: off Last check: 09/09/2016   BP & Labs:  MAP94 - Doppler is reflecting MAP  Hgb 9.7 - No S/S of bleeding. Specifically denies melena/BRBPR or nosebleeds.  LDH stable at 207 with established baseline of 180- 250. Denies tea-colored urine. No power elevations noted on interrogation.   Patient refused  6 minute walk test due to DOE.  Neurocognitive trail making completed correctly in 3.5 minutes.   [address] Cardiomyopathy, EQ-5D-3L and post-VAD QOL completed by the patient independently.   Return to clinic in 1 week for INR/Co-ox/VAD visit  Balinda Quails RN [place] Coordinator   Office: [phone] 24/7 Emergency VAD Pager: [phone]

## 2016-12-15 ENCOUNTER — Telehealth (HOSPITAL_COMMUNITY): Payer: Self-pay | Admitting: Surgery

## 2016-12-15 NOTE — Telephone Encounter (Signed)
CSW contacted LVAD patient Ronald Miller per patient request to provide patient's phone number for support call. Patient shared during clinic visit that he is feeling a bit overwhelmed with multiple set backs since VAD surgery and felt speaking with another VAD patient would be helpful. Patient had attended LVAD Support Group last month and had an opportunity to speak with Iona Beard and his caregiver and felt the connection was positive and communicating with them would be helpful. Ronald Miller was very receptive to providing support call to patient. CSW informed patient who was grateful for the intervention. CSW continues to follow and monitor for supportive intervention. Raquel Sarna, [place], [place]

## 2016-12-15 NOTE — Telephone Encounter (Signed)
Ronald Miller contacted [facility] SW and myself regarding his medications --specifically his Sildenafil and Novolog Pen.  He tells [place] that his medication pricing has increased and he is no longer able to afford them.  He was wondering if we could "check" into insurance coverage and pricing.  I will forward his concerns to Bethel D regarding insurance coverage and medication costs.

## 2016-12-16 ENCOUNTER — Telehealth (HOSPITAL_COMMUNITY): Payer: Self-pay

## 2016-12-16 MED ORDER — INSULIN ASPART 100 UNIT/ML FLEXPEN: 10.0000 [IU] | PEN_INJECTOR | Freq: Three times a day (TID) | SUBCUTANEOUS | 11 refills | Status: DC

## 2016-12-16 NOTE — Telephone Encounter (Signed)
Pharmacy Medication-Related Note  Ronald Miller contacted the clinic stating that his Novolog insulin was too expensive. Upon calling his insurance plan [facility] Medicare), Novolog vials are $236.50/month whereas Novolog Flexpens are $47/month now that his $195 deductible has been met. Changed prescription to pens.  Contacted Ronald Miller, who stated that he still has Lantus pens at home and asked if he could use them. I advised him that Lantus 20 units once daily is equivalent to Levemir 10 units bid. He voiced understanding this. Instructed to call the clinic back with any further questions or concerns.   Belia Heman, PharmD PGY1 Pharmacy Resident [phone] (Pager) 12/16/2016 2:28 PM

## 2016-12-20 ENCOUNTER — Telehealth (HOSPITAL_COMMUNITY): Payer: Self-pay | Admitting: *Deleted

## 2016-12-20 DIAGNOSIS — I5020 Unspecified systolic (congestive) heart failure: Secondary | ICD-10-CM

## 2016-12-20 DIAGNOSIS — R188 Other ascites: Secondary | ICD-10-CM

## 2016-12-20 DIAGNOSIS — Z95811 Presence of heart assist device: Secondary | ICD-10-CM

## 2016-12-21 ENCOUNTER — Encounter: Payer: Self-pay | Admitting: *Deleted

## 2016-12-21 ENCOUNTER — Telehealth: Payer: Self-pay | Admitting: *Deleted

## 2016-12-21 ENCOUNTER — Telehealth: Payer: Self-pay | Admitting: Licensed Clinical Social Worker

## 2016-12-21 DIAGNOSIS — Z95811 Presence of heart assist device: Secondary | ICD-10-CM

## 2016-12-21 DIAGNOSIS — I5022 Chronic systolic (congestive) heart failure: Secondary | ICD-10-CM

## 2016-12-21 NOTE — Telephone Encounter (Signed)
-----   Message from Jolaine Artist, MD sent at 12/20/2016 10:07 PM EDT ----- Regarding: RE: Return to Cardiac Rehab He should be ready to go. He is on milrinone but I am ok with him exercising with that.   thanks  ----- Message ----- From: Clotilde Dieter Sent: 12/20/2016  11:24 AM To: Jolaine Artist, MD Subject: Return to Cardiac Rehab                        Dr. Haroldine Laws,  Please let me know when you would like for Mr. Lashomb to return to rehab after his hospitalization.  Thanks for your help! Alberteen Sam, MA, ACSM RCEP 12/20/2016 11:25 AM

## 2016-12-21 NOTE — Telephone Encounter (Signed)
CSW contacted patient to follow up. Patient reports all going well with his move to a new apartment. Patient has appointment tomorrow in the lab and will follow up with CSW for support and discuss referral for supportive counseling. Raquel Sarna, [place], [place]

## 2016-12-21 NOTE — Telephone Encounter (Signed)
Called to let Ronald Miller that Dr. Haroldine Laws has cleared him to return.  He will return on Thursday.

## 2016-12-22 ENCOUNTER — Ambulatory Visit (HOSPITAL_COMMUNITY)
Admission: RE | Admit: 2016-12-22 | Discharge: 2016-12-22 | Disposition: A | Discharge status: Home / Self Care | Payer: Medicare HMO | Source: Ambulatory Visit | Attending: Internal Medicine | Admitting: Internal Medicine

## 2016-12-22 ENCOUNTER — Other Ambulatory Visit (HOSPITAL_COMMUNITY): Payer: Medicare HMO

## 2016-12-22 ENCOUNTER — Ambulatory Visit (HOSPITAL_COMMUNITY): Payer: Self-pay | Admitting: Infectious Diseases

## 2016-12-22 VITALS — Wt 230.0 lb

## 2016-12-22 DIAGNOSIS — Z5181 Encounter for therapeutic drug level monitoring: Secondary | ICD-10-CM | POA: Diagnosis present

## 2016-12-22 DIAGNOSIS — Z79899 Other long term (current) drug therapy: Secondary | ICD-10-CM | POA: Insufficient documentation

## 2016-12-22 DIAGNOSIS — Z95811 Presence of heart assist device: Secondary | ICD-10-CM

## 2016-12-22 DIAGNOSIS — Z7901 Long term (current) use of anticoagulants: Secondary | ICD-10-CM

## 2016-12-22 DIAGNOSIS — I2510 Atherosclerotic heart disease of native coronary artery without angina pectoris: Secondary | ICD-10-CM

## 2016-12-22 LAB — COOXEMETRY PANEL
CARBOXYHEMOGLOBIN: 1.5 % (ref 0.5–1.5)
Methemoglobin: 0.7 % (ref 0.0–1.5)
O2 SAT: 48.5 %
Total hemoglobin: 10 g/dL — ABNORMAL LOW (ref 12.0–16.0)

## 2016-12-22 LAB — PROTIME-INR
INR: 1.81
PROTHROMBIN TIME: 21.2 s — AB (ref 11.4–15.2)

## 2016-12-22 MED ORDER — METOLAZONE 2.5 MG PO TABS: 2.5000 mg | ORAL_TABLET | Freq: Every day | ORAL | 3 refills | Status: DC

## 2016-12-22 MED ORDER — WARFARIN SODIUM 5 MG PO TABS: 7.5000 mg | ORAL_TABLET | Freq: Every day | ORAL | 10 refills | Status: DC

## 2016-12-22 MED ORDER — MILRINONE LACTATE IN DEXTROSE 20-5 MG/100ML-% IV SOLN: 0.2500 ug/kg/min | INTRAVENOUS | 6 refills | Status: DC

## 2016-12-22 NOTE — Patient Instructions (Addendum)
We will let home health know to increase your milrinone dose. If they do not come to increase call them tomorrow.   Start Metolazone 2.5 mg tablets - one pill ONCE A WEEK. When you take this pill, take an EXTRA pill of potassium (20 mEq)  Back to see [place] in 1 week for lab work.   INR 1.8 - we will call you with your coumadin instructions

## 2016-12-22 NOTE — Progress Notes (Addendum)
Driveline Dressing Care:     Patient presents to clinic today for lab work and requested drive line exit wound care. Existing VAD dressing removed and site care performed using sterile technique. Drive line exit site cleaned with STERILE SALINE ONLY (CHG prep Allergy) allowed to dry; biopatch and Sorbaview dressing re-applied. Exit site healed and incorporated, the velour is fully implanted at exit site. Small yellow scab removed during cleansing. Slightly pink around DL (biopatch allergy??), tenderness, drainage, foul odor or rash noted. Drive line anchor re-applied to allow for less tension with modular driveline. Pt denies fever or chills.   INR to be repeated in 1 - 2 weeks pending results per anticoagulation protocol.   Co-Ox results given to Dr. Haroldine Laws - increased milrinone to 0.25 mcg/kg/min. Complaining of increased shortness of breath with activities and ADLs. Weight up 3 lbs recently (230 lbs). Added Metolazone 2.5 mg w/ extra 20 mEq KCl 1x/week.   He did admit that he has been "drinking a lot of fluids - sweet tea and water." I advised that this was something he needs to cut back on considering we are constantly increasing diuretics. Reinforced need to stay at 2L max per day for all fluids.   Back in 1 week for FU visit.   [name], RN VAD Coordinator   Office: [phone] 24/7 Emergency VAD Pager: [phone]

## 2016-12-22 NOTE — Progress Notes (Signed)
CSW met with patient in the clinic. Patient reports he moved to his own apartment and "slept like a log all night". Patient was in good spirits and glad to have his own place again. CSW discussed referral for mental health and patient states plans to follow up with member services to obtain options for counseling. CSW will follow up with patient for further assistance with referral and support. Raquel Sarna, [place], [place]

## 2016-12-23 ENCOUNTER — Encounter (HOSPITAL_COMMUNITY): Payer: Self-pay | Admitting: Pharmacist

## 2016-12-24 ENCOUNTER — Telehealth (HOSPITAL_COMMUNITY): Payer: Self-pay | Admitting: Infectious Diseases

## 2016-12-24 NOTE — Telephone Encounter (Signed)
Called yesterday 3/22 from [facility] reporting he was up to 231 lb, dizzy and SOB. He reports he is doing better with limiting his PO fluid intake. He is also currently moving into a new apartment so may be over doing it a bit. He took the 2.5 mg metolazone this morning but reports only minimal output compared to normal. He has one more scheduled dose of torsemide 40 mg later this evening and Judson Roch is increasing his milrinone to 0.59mcg/kg/min now. D/W Dr. Haroldine Laws and will have him repeat metolazone + KCl tomorrow and if no further improvement will give 80 mg IV lasix x 1.   12/24/16 07:30 - called to report that he lost 4 lbs over night but is still SOB. Advised to take the metolazone/KCl as planned in addition to his scheduled torsemide today.   [name], RN VAD Coordinator   Office: [phone] 24/7 Emergency VAD Pager: [phone]

## 2016-12-27 ENCOUNTER — Other Ambulatory Visit (HOSPITAL_COMMUNITY): Payer: Self-pay | Admitting: Internal Medicine

## 2016-12-27 ENCOUNTER — Telehealth (HOSPITAL_COMMUNITY): Payer: Self-pay | Admitting: *Deleted

## 2016-12-27 NOTE — Telephone Encounter (Signed)
Notified Saturday 3/24 from [facility] that he lost another 2 lbs and breathing status improved. Did not need IV lasix. Encouraged to continue the metolazone 2.5 mg with 20 mEq KCl as ordered once a week in addition to torsemide 40/40.   [name], RN VAD Coordinator   Office: [phone] 24/7 Emergency VAD Pager: [phone]

## 2016-12-27 NOTE — Telephone Encounter (Signed)
Pt called to c/o feeling bad, full of fluid.  He states Sat his wt was down to 227 lb, yesterday he went to 231 and today he is up to 233.2 lb.  He states he is SOB and coughing.  He states Sat his coughing was so bad he became dizzy and fell twice.  He states he did not pass out but felt like he might, this improved after sitting for a few minutes.  Discussed w/Stephanie Doren Custard, RN VAD coordinator, she states [facility] has order to give IV lasix over weekend but it was not given b/c Sat AM pt told her he was doing better, she states pt can go ahead and get IV Lasix and f/u with [place] on Wed as scheduled.  Pt is agreeable.  I called his [place] [phone]) and gave VO for IV Lasix, she states she will give it in AM.

## 2016-12-29 ENCOUNTER — Ambulatory Visit (HOSPITAL_COMMUNITY): Payer: Self-pay | Admitting: Pharmacist

## 2016-12-29 ENCOUNTER — Encounter: Payer: Self-pay | Admitting: *Deleted

## 2016-12-29 ENCOUNTER — Ambulatory Visit (HOSPITAL_COMMUNITY)
Admission: RE | Admit: 2016-12-29 | Discharge: 2016-12-29 | Disposition: A | Discharge status: Home / Self Care | Payer: Medicare HMO | Source: Ambulatory Visit | Attending: Cardiology | Admitting: Cardiology

## 2016-12-29 VITALS — BP 90/0 | Ht 69.0 in | Wt 236.8 lb

## 2016-12-29 DIAGNOSIS — Z95811 Presence of heart assist device: Secondary | ICD-10-CM | POA: Insufficient documentation

## 2016-12-29 DIAGNOSIS — I5040 Unspecified combined systolic (congestive) and diastolic (congestive) heart failure: Secondary | ICD-10-CM | POA: Diagnosis not present

## 2016-12-29 DIAGNOSIS — I5022 Chronic systolic (congestive) heart failure: Secondary | ICD-10-CM

## 2016-12-29 DIAGNOSIS — Z7901 Long term (current) use of anticoagulants: Secondary | ICD-10-CM | POA: Diagnosis not present

## 2016-12-29 LAB — CBC
HCT: 32 % — ABNORMAL LOW (ref 39.0–52.0)
Hemoglobin: 10 g/dL — ABNORMAL LOW (ref 13.0–17.0)
MCH: 24.2 pg — ABNORMAL LOW (ref 26.0–34.0)
MCHC: 31.3 g/dL (ref 30.0–36.0)
MCV: 77.5 fL — AB (ref 78.0–100.0)
PLATELETS: 226 10*3/uL (ref 150–400)
RBC: 4.13 MIL/uL — ABNORMAL LOW (ref 4.22–5.81)
RDW: 16.8 % — AB (ref 11.5–15.5)
WBC: 7.7 10*3/uL (ref 4.0–10.5)

## 2016-12-29 LAB — PROTIME-INR
INR: 1.78
Prothrombin Time: 21 seconds — ABNORMAL HIGH (ref 11.4–15.2)

## 2016-12-29 LAB — BASIC METABOLIC PANEL
ANION GAP: 14 (ref 5–15)
BUN: 85 mg/dL — ABNORMAL HIGH (ref 6–20)
CALCIUM: 8.9 mg/dL (ref 8.9–10.3)
CHLORIDE: 81 mmol/L — AB (ref 101–111)
CO2: 32 mmol/L (ref 22–32)
CREATININE: 1.6 mg/dL — AB (ref 0.61–1.24)
GFR calc Af Amer: 52 mL/min — ABNORMAL LOW (ref >60)
GFR calc non Af Amer: 45 mL/min — ABNORMAL LOW (ref >60)
Glucose, Bld: 181 mg/dL — ABNORMAL HIGH (ref 65–99)
POTASSIUM: 3.2 mmol/L — AB (ref 3.5–5.1)
Sodium: 127 mmol/L — ABNORMAL LOW (ref 135–145)

## 2016-12-29 LAB — CARBOXYHEMOGLOBIN - COOX: Carboxyhemoglobin: 1 % (ref 0.5–1.5)

## 2016-12-29 LAB — LACTATE DEHYDROGENASE: LDH: 233 U/L — AB (ref 98–192)

## 2016-12-29 MED ORDER — DOCUSATE SODIUM 100 MG PO CAPS: 200.0000 mg | ORAL_CAPSULE | Freq: Every day | ORAL | 0 refills | Status: DC

## 2016-12-29 NOTE — Progress Notes (Signed)
Cardiac Individual Treatment Plan  Patient Details  Name: Ronald Miller. MRN: [id number] Date of Birth: [birth year] Referring Provider:     Cardiac Rehab from 11/23/2016 in [facility] Cardiac and Pulmonary Rehab  Referring Provider  Glori Bickers MD      Initial Encounter Date:    Cardiac Rehab from 11/23/2016 in [facility] Cardiac and Pulmonary Rehab  Date  11/23/16  Referring Provider  Glori Bickers MD      Visit Diagnosis: Heart failure, chronic systolic ([place])  LVAD (left ventricular assist device) present [facility])  Patient's Home Medications on Admission:  Current Outpatient Prescriptions:  .  amiodarone (PACERONE) 200 MG tablet, Take 1 tablet (200 mg total) by mouth daily., Disp: 60 tablet, Rfl: 6 .  aspirin EC 81 MG EC tablet, Take 1 tablet (81 mg total) by mouth daily., Disp: 30 tablet, Rfl: 6 .  citalopram (CELEXA) 10 MG tablet, Take 1 tablet (10 mg total) by mouth daily., Disp: 30 tablet, Rfl: 6 .  digoxin (LANOXIN) 0.125 MG tablet, Take 125 mcg by mouth daily., Disp: , Rfl:  .  docusate sodium (COLACE) 100 MG capsule, Take 2 capsules (200 mg total) by mouth daily., Disp: 10 capsule, Rfl: 0 .  gabapentin (NEURONTIN) 100 MG capsule, Take 2 capsules (200 mg total) by mouth 2 (two) times daily., Disp: 60 capsule, Rfl: 6 .  HYDROcodone-acetaminophen (NORCO/VICODIN) 5-325 MG tablet, , Disp: , Rfl:  .  insulin aspart (NOVOLOG FLEXPEN) 100 UNIT/ML FlexPen, Inject 10 Units into the skin 3 (three) times daily with meals., Disp: 15 mL, Rfl: 11 .  Insulin Detemir (LEVEMIR) 100 UNIT/ML Pen, Inject 10 Units into the skin 2 (two) times daily at 8 am and 10 pm., Disp: 15 pen, Rfl: 11 .  losartan (COZAAR) 25 MG tablet, Take 1 tablet (25 mg total) by mouth daily., Disp: 30 tablet, Rfl: 6 .  magnesium oxide (MAG-OX) 400 (241.3 Mg) MG tablet, Take 1 tablet (400 mg total) by mouth daily., Disp: 30 tablet, Rfl: 6 .  metolazone (ZAROXOLYN) 2.5 MG tablet, Take 1 tablet (2.5 mg total) by mouth daily.,  Disp: 15 tablet, Rfl: 3 .  milrinone (PRIMACOR) 20 MG/100 ML SOLN infusion, Inject 25.175 mcg/min into the vein continuous. Per [facility] pharmacy, Disp: 100 mL, Rfl: 6 .  pantoprazole (PROTONIX) 40 MG tablet, Take 1 tablet (40 mg total) by mouth daily., Disp: 30 tablet, Rfl: 6 .  potassium chloride SA (K-DUR,KLOR-CON) 20 MEQ tablet, Take 1 tablet (20 mEq total) by mouth daily., Disp: 30 tablet, Rfl: 6 .  sildenafil (REVATIO) 20 MG tablet, Take 1 tablet (20 mg total) by mouth 3 (three) times daily., Disp: 90 tablet, Rfl: 12 .  spironolactone (ALDACTONE) 25 MG tablet, Take 1 tablet (25 mg total) by mouth daily., Disp: 30 tablet, Rfl: 6 .  torsemide (DEMADEX) 20 MG tablet, Take 2 tablets (40 mg total) by mouth 2 (two) times daily., Disp: 60 tablet, Rfl: 12 .  traZODone (DESYREL) 50 MG tablet, Take 1 tablet (50 mg total) by mouth at bedtime., Disp: 30 tablet, Rfl: 12 .  warfarin (COUMADIN) 5 MG tablet, Take 1.5-2 tablets (7.5-10 mg total) by mouth daily. And as prescribed on blood checks, Disp: 70 tablet, Rfl: 10  Past Medical History: Past Medical History:  Diagnosis Date  . AICD (automatic cardioverter/defibrillator) present   . ASCVD (arteriosclerotic cardiovascular disease)   . Chronic systolic CHF (congestive heart failure) ([place])   . COPD (chronic obstructive pulmonary disease) ([place])   . Coronary artery disease   .  Depression   . Diabetes mellitus   . History of cocaine abuse   . Hypertension   . Presence of permanent cardiac pacemaker   . Shortness of breath dyspnea   . Suicidal ideation   . Tobacco abuse     Tobacco Use: History  Smoking Status  . Former Smoker  . Packs/day: 1.50  . Years: 23.00  . Types: Cigarettes  . Quit date: 08/27/2015  Smokeless Tobacco  . Never Used    Labs: Recent Review Flowsheet Data    Labs for ITP Cardiac and Pulmonary Rehab Latest Ref Rng & Units 12/05/2016 12/06/2016 12/08/2016 12/14/2016 12/22/2016   Cholestrol 0 - 200 mg/dL - - - - -   LDLCALC 0 -  100 mg/dL - - - - -   HDL 40 - 60 mg/dL - - - - -   Trlycerides 0 - 200 mg/dL - - - - -   Hemoglobin A1c 4.8 - 5.6 % - - - - -   PHART 7.350 - 7.450 - - - - -   PCO2ART 32.0 - 48.0 mmHg - - - - -   HCO3 20.0 - 28.0 mmol/L - - - - -   TCO2 0 - 100 mmol/L - - - - -   ACIDBASEDEF 0.0 - 2.0 mmol/L - - - - -   O2SAT % 54.1 59.1 55.2 46.8 48.5       Exercise Target Goals:    Exercise Program Goal: Individual exercise prescription set with THRR, safety & activity barriers. Participant demonstrates ability to understand and report RPE using BORG scale, to self-measure pulse accurately, and to acknowledge the importance of the exercise prescription.  Exercise Prescription Goal: Starting with aerobic activity 30 plus minutes a day, 3 days per week for initial exercise prescription. Provide home exercise prescription and guidelines that participant acknowledges understanding prior to discharge.  Activity Barriers & Risk Stratification:     Activity Barriers & Cardiac Risk Stratification - 11/18/16 1447      Activity Barriers & Cardiac Risk Stratification   Activity Barriers Deconditioning;Muscular Weakness;Shortness of Breath;Decreased Ventricular Function;Back Problems   Cardiac Risk Stratification High      6 Minute Walk:     6 Minute Walk    Row Name 11/23/16 1030         6 Minute Walk   Phase Initial     Distance 755 feet     Walk Time 4 minutes     # of Rest Breaks 1  30 sec     MPH 2.14     METS 2.61     RPE 15     Perceived Dyspnea  3     VO2 Peak 6.6     Symptoms Yes (comment)     Comments SOB     Resting HR 61 bpm     Resting BP -  92 dopplar     Max Ex. HR 100 bpm     Max Ex. BP -  90 dopplar        Oxygen Initial Assessment:   Oxygen Re-Evaluation:   Oxygen Discharge (Final Oxygen Re-Evaluation):   Initial Exercise Prescription:     Initial Exercise Prescription - 11/23/16 1000      Date of Initial Exercise RX and Referring Provider    Date 11/23/16   Referring Provider Glori Bickers MD     Treadmill   MPH 1.4   Grade 0.5   Minutes 15   METs 2.07  NuStep   Level 1   Minutes 15   METs 2     REL-XR   Level 1   Minutes 15   METs 2     Prescription Details   Frequency (times per week) 2   Duration Progress to 45 minutes of aerobic exercise without signs/symptoms of physical distress     Intensity   THRR 40-80% of Max Heartrate 100-140   Ratings of Perceived Exertion 11-13   Perceived Dyspnea 0-4     Progression   Progression Continue to progress workloads to maintain intensity without signs/symptoms of physical distress.     Resistance Training   Training Prescription Yes   Weight 3 lbs   Reps 10-15      Perform Capillary Blood Glucose checks as needed.  Exercise Prescription Changes:     Exercise Prescription Changes    Row Name 11/23/16 1000             Response to Exercise   Blood Pressure (Admit) -  92 dopplar       Blood Pressure (Exercise) -  90 dopplar       Heart Rate (Admit) 61 bpm       Heart Rate (Exercise) 100 bpm       Oxygen Saturation (Admit) 94 %       Rating of Perceived Exertion (Exercise) 15       Perceived Dyspnea (Exercise) 3       Symptoms SOB         Exercise Review   Progression -  walk test results          Exercise Comments:     Exercise Comments    Row Name 11/24/16 1516 11/25/16 1029 11/25/16 1030       Exercise Comments [name] returned to rehab to complete his sessions.  He started back on session number 5.  He completed his 6MWT to reset his workloads.  We will continue to monitor his progression. Dizzy and lightheaded on Treadmill.  Dopplar BP was 96 which was up from 90 at check in and 86 after the NuStep.  He was given some water and rested.  He also complained SOB where he felt like he just could not take a deep breath and had some gas trapped (burping some).  He was on his way to see Dr. Haroldine Laws for a follow up appt today.  First full  day of exercise!  Patient was oriented to gym and equipment including functions, settings, policies, and procedures.  Patient's individual exercise prescription and treatment plan were reviewed.  All starting workloads were established based on the results of the 6 minute walk test done at initial orientation visit.  The plan for exercise progression was also introduced and progression will be customized based on patient's performance and goals.        Exercise Goals and Review:   Exercise Goals Re-Evaluation :   Discharge Exercise Prescription (Final Exercise Prescription Changes):     Exercise Prescription Changes - 11/23/16 1000      Response to Exercise   Blood Pressure (Admit) --  92 dopplar   Blood Pressure (Exercise) --  90 dopplar   Heart Rate (Admit) 61 bpm   Heart Rate (Exercise) 100 bpm   Oxygen Saturation (Admit) 94 %   Rating of Perceived Exertion (Exercise) 15   Perceived Dyspnea (Exercise) 3   Symptoms SOB     Exercise Review   Progression --  walk test results  Nutrition:  Target Goals: Understanding of nutrition guidelines, daily intake of sodium <1571m, cholesterol <2063m calories 30% from fat and 7% or less from saturated fats, daily to have 5 or more servings of fruits and vegetables.  Biometrics:     Pre Biometrics - 11/23/16 1036      Pre Biometrics   Height '5\' 10"'  (1.778 m)   Weight 230 lb 12.8 oz (104.7 kg)   Waist Circumference 45.5 inches   Hip Circumference 43.5 inches   Waist to Hip Ratio 1.05 %   BMI (Calculated) 33.2   Single Leg Stand 8.16 seconds       Nutrition Therapy Plan and Nutrition Goals:     Nutrition Therapy & Goals - 11/23/16 1036      Nutrition Therapy   Drug/Food Interactions Statins/Certain Fruits     Intervention Plan   Intervention Prescribe, educate and counsel regarding individualized specific dietary modifications aiming towards targeted core components such as weight, hypertension, lipid management,  diabetes, heart failure and other comorbidities.   Expected Outcomes Short Term Goal: Understand basic principles of dietary content, such as calories, fat, sodium, cholesterol and nutrients.;Long Term Goal: Adherence to prescribed nutrition plan.;Short Term Goal: A plan has been developed with personal nutrition goals set during dietitian appointment.      Nutrition Discharge: Rate Your Plate Scores:     Nutrition Assessments - 11/23/16 1036      MEDFICTS Scores   Pre Score 6      Nutrition Goals Re-Evaluation:   Nutrition Goals Discharge (Final Nutrition Goals Re-Evaluation):   Psychosocial: Target Goals: Acknowledge presence or absence of significant depression and/or stress, maximize coping skills, provide positive support system. Participant is able to verbalize types and ability to use techniques and skills needed for reducing stress and depression.   Initial Review & Psychosocial Screening:     Initial Psych Review & Screening - 11/23/16 1036      Initial Review   Current issues with Current Depression;Current Anxiety/Panic;History of Depression     Family Dynamics   Good Support System? Yes  mother and sister   Comments New to recognizing his depression since his LVAD.  Was rejected from transplant list current for cocaine in urine (states he has been clean).  Still getting used to LVAD device.     Barriers   Psychosocial barriers to participate in program The patient should benefit from training in stress management and relaxation.;Psychosocial barriers identified (see note)     Screening Interventions   Interventions Encouraged to exercise;Program counselor consult      Quality of Life Scores:      Quality of Life - 11/23/16 1039      Quality of Life Scores   Health/Function Pre 19.33 %   Socioeconomic Pre 18.75 %   Psych/Spiritual Pre 21.93 %   Family Pre 29.38 %   GLOBAL Pre 21.05 %      PHQ-9: Recent Review Flowsheet Data    [facility]  [facility] 11/23[facility]2016 05/24/2016 02/03/2016 11/06/2015   Decreased Interest 1 1 0 1   Down, Depressed, Hopeless 1 0 0 2   PHQ - 2 Score 2 1 0 3   Altered sleeping 2 2 - -   Tired, decreased energy 1 1 - -   Change in appetite 0 0 - -   Feeling bad or failure about yourself  2 1 - -   Trouble concentrating 2 0 - -   Moving slowly or fidgety/restless 1 2 - -  Suicidal thoughts 0 0 - 0   PHQ-9 Score 10 7 - -   Difficult doing work/chores Not difficult at all Not difficult at all - -     Interpretation of Total Score  Total Score Depression Severity:  1-4 = Minimal depression, 5-9 = Mild depression, 10-14 = Moderate depression, 15-19 = Moderately severe depression, 20-27 = Severe depression   Psychosocial Evaluation and Intervention:     Psychosocial Evaluation - 11/23/16 0924      Psychosocial Evaluation & Interventions   Interventions Stress management education;Relaxation education;Encouraged to exercise with the program and follow exercise prescription   Comments Counselor met with Mr. Robitaille today for initial psychosocial evaluation.  He is a 61 year old who had an LVAD procedure several months ago.  He has a strong support system with lots of family close by and he currently lives with his sister and her husband.  Mr. Correa also has an adult daughter who lives locally and checks on him often by phone.  He has an extensive history of heart disease with 6 by passes 20 years ago and he also is Type 2 Diabetic.  He reports not sleeping well since the surgery in December until last night subsequent to Dr. prescribing a new medication to help with this.  He denies a history of depression or anxiety but states his Dr. thinks he "might be depressed."  Mr. Brakebill claims he is typically in a good mood and kids around with others quite a bit.  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  He is on the heart transplant list and quit smoking in  November (several months ago) so hoping to get a heart in the near future.  Staff and counselor will be monitoring Mr. Bowlds throughout the course of this program.        Psychosocial Re-Evaluation:   Psychosocial Discharge (Final Psychosocial Re-Evaluation):   Vocational Rehabilitation: Provide vocational rehab assistance to qualifying candidates.   Vocational Rehab Evaluation & Intervention:     Vocational Rehab - 11/23/16 1035      Initial Vocational Rehab Evaluation & Intervention   Assessment shows need for Vocational Rehabilitation No      Education: Education Goals: Education classes will be provided on a weekly basis, covering required topics. Participant will state understanding/return demonstration of topics presented.  Learning Barriers/Preferences:     Learning Barriers/Preferences - 11/23/16 1035      Learning Barriers/Preferences   Learning Barriers Exercise Concerns   Learning Preferences None      Education Topics: General Nutrition Guidelines/Fats and Fiber: -Group instruction provided by verbal, written material, models and posters to present the general guidelines for heart healthy nutrition. Gives an explanation and review of dietary fats and fiber.   Controlling Sodium/Reading Food Labels: -Group verbal and written material supporting the discussion of sodium use in heart healthy nutrition. Review and explanation with models, verbal and written materials for utilization of the food label.   Exercise Physiology & Risk Factors: - Group verbal and written instruction with models to review the exercise physiology of the cardiovascular system and associated critical values. Details cardiovascular disease risk factors and the goals associated with each risk factor.   Aerobic Exercise & Resistance Training: - Gives group verbal and written discussion on the health impact of inactivity. On the components of aerobic and resistive training programs and  the benefits of this training and how to safely progress  through these programs.   Flexibility, Balance, General Exercise Guidelines: - Provides group verbal and written instruction on the benefits of flexibility and balance training programs. Provides general exercise guidelines with specific guidelines to those with heart or lung disease. Demonstration and skill practice provided.   Stress Management: - Provides group verbal and written instruction about the health risks of elevated stress, cause of high stress, and healthy ways to reduce stress.   Depression: - Provides group verbal and written instruction on the correlation between heart/lung disease and depressed mood, treatment options, and the stigmas associated with seeking treatment.   Anatomy & Physiology of the Heart: - Group verbal and written instruction and models provide basic cardiac anatomy and physiology, with the coronary electrical and arterial systems. Review of: AMI, Angina, Valve disease, Heart Failure, Cardiac Arrhythmia, Pacemakers, and the ICD.   Cardiac Procedures: - Group verbal and written instruction and models to describe the testing methods done to diagnose heart disease. Reviews the outcomes of the test results. Describes the treatment choices: Medical Management, Angioplasty, or Coronary Bypass Surgery.   Cardiac Medications: - Group verbal and written instruction to review commonly prescribed medications for heart disease. Reviews the medication, class of the drug, and side effects. Includes the steps to properly store meds and maintain the prescription regimen.   Cardiac Rehab from 11/23/2016 in [facility] Cardiac and Pulmonary Rehab  Date  11/23/16  Educator  SB  Instruction Review Code  2- meets goals/outcomes      Go Sex-Intimacy & Heart Disease, Get SMART - Goal Setting: - Group verbal and written instruction through game format to discuss heart disease and the return to sexual intimacy. Provides  group verbal and written material to discuss and apply goal setting through the application of the S.M.A.R.T. Method.   Other Matters of the Heart: - Provides group verbal, written materials and models to describe Heart Failure, Angina, Valve Disease, and Diabetes in the realm of heart disease. Includes description of the disease process and treatment options available to the cardiac patient.   Exercise & Equipment Safety: - Individual verbal instruction and demonstration of equipment use and safety with use of the equipment.   Cardiac Rehab from 11/23/2016 in [facility] Cardiac and Pulmonary Rehab  Date  11/23/16  Educator  CE  Instruction Review Code  2- meets goals/outcomes      Infection Prevention: - Provides verbal and written material to individual with discussion of infection control including proper hand washing and proper equipment cleaning during exercise session.   Cardiac Rehab from 11/23/2016 in [facility] Cardiac and Pulmonary Rehab  Date  11/23/16  Educator  CE  Instruction Review Code  2- meets goals/outcomes      Falls Prevention: - Provides verbal and written material to individual with discussion of falls prevention and safety.   Cardiac Rehab from 11/23/2016 in [facility] Cardiac and Pulmonary Rehab  Date  11/23/16  Educator  CE  Instruction Review Code  2- meets goals/outcomes      Diabetes: - Individual verbal and written instruction to review signs/symptoms of diabetes, desired ranges of glucose level fasting, after meals and with exercise. Advice that pre and post exercise glucose checks will be done for 3 sessions at entry of program.   Cardiac Rehab from 11/23/2016 in [facility] Cardiac and Pulmonary Rehab  Date  11/23/16  Educator  CE  Instruction Review Code  2- meets goals/outcomes       Knowledge Questionnaire Score:     Knowledge Questionnaire Score -  11/23/16 1035      Knowledge Questionnaire Score   Pre Score 21/28      Core Components/Risk Factors/Patient  Goals at Admission:     Personal Goals and Risk Factors at Admission - 11/18/16 1446      Core Components/Risk Factors/Patient Goals on Admission    Weight Management Yes;Weight Loss   Intervention Weight Management: Develop a combined nutrition and exercise program designed to reach desired caloric intake, while maintaining appropriate intake of nutrient and fiber, sodium and fats, and appropriate energy expenditure required for the weight goal.;Weight Management: Provide education and appropriate resources to help participant work on and attain dietary goals.   Expected Outcomes Short Term: Continue to assess and modify interventions until short term weight is achieved;Long Term: Adherence to nutrition and physical activity/exercise program aimed toward attainment of established weight goal;Weight Loss: Understanding of general recommendations for a balanced deficit meal plan, which promotes 1-2 lb weight loss per week and includes a negative energy balance of [phone] kcal/d;Understanding recommendations for meals to include 15-35% energy as protein, 25-35% energy from fat, 35-60% energy from carbohydrates, less than 258m of dietary cholesterol, 20-35 gm of total fiber daily;Understanding of distribution of calorie intake throughout the day with the consumption of 4-5 meals/snacks   Sedentary Yes   Intervention Provide advice, education, support and counseling about physical activity/exercise needs.;Develop an individualized exercise prescription for aerobic and resistive training based on initial evaluation findings, risk stratification, comorbidities and participant's personal goals.   Expected Outcomes Achievement of increased cardiorespiratory fitness and enhanced flexibility, muscular endurance and strength shown through measurements of functional capacity and personal statement of participant.   Increase Strength and Stamina Yes   Intervention Provide advice, education, support and counseling  about physical activity/exercise needs.;Develop an individualized exercise prescription for aerobic and resistive training based on initial evaluation findings, risk stratification, comorbidities and participant's personal goals.   Expected Outcomes Achievement of increased cardiorespiratory fitness and enhanced flexibility, muscular endurance and strength shown through measurements of functional capacity and personal statement of participant.   Improve shortness of breath with ADL's Yes   Intervention Provide education, individualized exercise plan and daily activity instruction to help decrease symptoms of SOB with activities of daily living.   Expected Outcomes Short Term: Achieves a reduction of symptoms when performing activities of daily living.   Diabetes Yes   Intervention Provide education about signs/symptoms and action to take for hypo/hyperglycemia.;Provide education about proper nutrition, including hydration, and aerobic/resistive exercise prescription along with prescribed medications to achieve blood glucose in normal ranges: Fasting glucose 65-99 mg/dL   Expected Outcomes Short Term: Participant verbalizes understanding of the signs/symptoms and immediate care of hyper/hypoglycemia, proper foot care and importance of medication, aerobic/resistive exercise and nutrition plan for blood glucose control.;Long Term: Attainment of HbA1C < 7%.   Heart Failure Yes   Intervention Provide a combined exercise and nutrition program that is supplemented with education, support and counseling about heart failure. Directed toward relieving symptoms such as shortness of breath, decreased exercise tolerance, and extremity edema.   Expected Outcomes Improve functional capacity of life;Short term: Attendance in program 2-3 days a week with increased exercise capacity. Reported lower sodium intake. Reported increased fruit and vegetable intake. Reports medication compliance.;Short term: Daily weights obtained  and reported for increase. Utilizing diuretic protocols set by physician.;Long term: Adoption of self-care skills and reduction of barriers for early signs and symptoms recognition and intervention leading to self-care maintenance.   Hypertension Yes   Intervention Provide education  on lifestyle modifcations including regular physical activity/exercise, weight management, moderate sodium restriction and increased consumption of fresh fruit, vegetables, and low fat dairy, alcohol moderation, and smoking cessation.;Monitor prescription use compliance.   Expected Outcomes Short Term: Continued assessment and intervention until BP is < 140/23m HG in hypertensive participants. < 130/814mHG in hypertensive participants with diabetes, heart failure or chronic kidney disease.;Long Term: Maintenance of blood pressure at goal levels.   Lipids Yes   Intervention Provide education and support for participant on nutrition & aerobic/resistive exercise along with prescribed medications to achieve LDL <7048mHDL >43m27m Expected Outcomes Long Term: Cholesterol controlled with medications as prescribed, with individualized exercise RX and with personalized nutrition plan. Value goals: LDL < 70mg88mL > 40 mg.;Short Term: Participant states understanding of desired cholesterol values and is compliant with medications prescribed. Participant is following exercise prescription and nutrition guidelines.   Stress Yes   Intervention Offer individual and/or small group education and counseling on adjustment to heart disease, stress management and health-related lifestyle change. Teach and support self-help strategies.;Refer participants experiencing significant psychosocial distress to appropriate mental health specialists for further evaluation and treatment. When possible, include family members and significant others in education/counseling sessions.   Expected Outcomes Long Term: Emotional wellbeing is indicated by absence  of clinically significant psychosocial distress or social isolation.;Short Term: Participant demonstrates changes in health-related behavior, relaxation and other stress management skills, ability to obtain effective social support, and compliance with psychotropic medications if prescribed.      Core Components/Risk Factors/Patient Goals Review:    Core Components/Risk Factors/Patient Goals at Discharge (Final Review):    ITP Comments:     ITP Comments    Row Name 11/18/16 1422 11/23/16 1029 12/01/16 0604 12/02/16 0848 12/21/16 1535   ITP Comments Called [name] to discuss having him return to Cardiac Rehab.  He would like to come to the Tues/Thurs 8 am class.  He will be restarting on Feb 20.  He will have a new referral and start on visit number 5 to complete his rehab for heart failure. [name] returned to rehab today.  ITP resumed with new addition of LVAD to his CHF diagnosis.  [facility] 30 day review. Continue with ITP unless directed changes per Medical Director review. Has returned to complete sessions after LVAD placement [name] was admitted from clinic with 15lbs weight gain and dyspnea on 11/30/16.  We will wait for clearance to return.  We will monitor her progress as inpatient to outpatient. Called to let [name] that Dr. BensiHaroldine Lawscleared him to return.  He will return on Thursday.   [place] 12/29/16 0607           ITP Comments 30 day review. Continue with ITP unless directed changes per Medical Director review  LAst visit 11/25/2016          Comments:

## 2016-12-29 NOTE — Patient Instructions (Addendum)
1. Do not take anymore Metolazone unless instructed by VAD team.  2.  We will fax an order for your home health nurse to come out and increase your Milrinone. 3. Take 40 mEq of Potassium tonight and then begin 40 mEq of Potassium daily.  4.. Return to clinic in 1 week.

## 2016-12-29 NOTE — Progress Notes (Signed)
Patient presents acutely in [facility] today. Reports no problems with VAD equipment or concerns with drive line.   Vital Signs:  Doppler Pressure: 90  Weight today: 236.8 w/ eqt Last Weight: 241 lb w/ eqt   VAD Indication: Destination Therapy - Evaluation completed at [facility] (halted with nicotine/SA).   VAD interrogation & Equipment Management: Speed: 5900 Flow: 6.0 Power: 4.8 w    PI: 2.2  Alarms: no clinical alarms  Events:100+ PI for 3/28 events   Fixed speed: 5900 Low speed limit: 5600  Primary Controller:  Replace back up battery in 27 months. Back up controller:   Replace back up battery in 27 months.  Annual Equipment Maintenance on UBC/PM was performed on December 2017.   I reviewed the LVAD parameters from today and compared the results to the patient's prior recorded data with Dr. Haroldine Laws. LVAD interrogation was NEGATIVE for significant power changes, NEGATIVE for clinical alarms and STABLE for PI events/speed drops. No programming changes were made and pump is functioning within specified parameters. Pt is performing daily controller and system monitor self tests along with completing weekly and monthly maintenance for LVAD equipment.  LVAD equipment check completed and is in good working order. Back-up equipment present.  Exit Site Care: Drive line is being maintained weekly by his sister and mother.However, Pt states that his sister was unable to change the dressing last night. Drive line exit site well healed and incorporated with slight redness but no drainage. The velour is fully implanted at exit site. Dressing falling off. No erythema or drainage. Stabilization device present but dangling. Pt denies fever or chills. Existing VAD dressing removed and site care performed using sterile technique. Drive line exit site cleaned with Chlora prep applicators x 2, allowed to dry, and Sorbaview dressing with bio patch re-applied. Exit site healed and incorporated.  Drive line anchor re-applied. Pt denies fever or chills.   Significant Events on VAD Support:  none  Plan: 1. Do not take anymore Metolazone unless instructed by VAD team.  2.  We will fax an order for your home health nurse to come out and increase your Milrinone to 0.392mcg/kg/min. 3. Take 40 mEq of Potassium tonight and then begin 40 mEq of Potassium daily.  4.. Return to clinic in 1 week.   Tanda Rockers, RN VAD Coordinator   Office: [phone] 24/7 Emergency VAD Pager: [phone]

## 2016-12-29 NOTE — Progress Notes (Signed)
Advanced Heart Failure Medication Review by a Pharmacist  Does the patient  feel that his/her medications are working for him/her?  yes  Has the patient been experiencing any side effects to the medications prescribed?  no  Does the patient measure his/her own blood pressure or blood glucose at home?  yes   Does the patient have any problems obtaining medications due to transportation or finances?   Yes - Insulin expensive and sildenafil denied by [facility]   Understanding of regimen: fair Understanding of indications: fair Potential of compliance: good Patient understands to avoid NSAIDs. Patient understands to avoid decongestants.  Issues to address at subsequent visits: Compliance/cost of medications   Pharmacist comments:  Reviewed each of [name]'s medications with him including timing of dosage and indications for use and provided him with a print out including this information. He stated that his insulins are costly for him. We have already changed his novolog vials to the flexpen which is significantly less expensive. He also states that he has had more hypoglycemic episodes recently and I have advised him to contact his PCP with this information. His sildenafil was recently denied by [facility] Part [facility] on appeal since it is being used for a non-FDA approved indication although there is evidence to suggest benefit for post LVAD RV failure. Using the GoodRx card, he could receive a 30 day supply through [employer] for ~$37/mo. He states that he will not be able to afford this cost on an ongoing basis. Have reached out to our Outpatient Pharmacy to see if we can provide a few months through our HF fund.   Ruta Hinds. Velva Harman, PharmD, BCPS, CPP Clinical Pharmacist Pager: [phone] Phone: [phone] 12/29/2016 12:29 PM      Time with patient: 30 minutes Preparation and documentation time: 10 minutes Total time: 40 minutes

## 2016-12-31 ENCOUNTER — Other Ambulatory Visit (HOSPITAL_COMMUNITY): Payer: Self-pay | Admitting: Pharmacist

## 2016-12-31 MED ORDER — SILDENAFIL CITRATE 20 MG PO TABS
20.0000 mg | ORAL_TABLET | Freq: Three times a day (TID) | ORAL | 2 refills | Status: DC
Start: 2016-12-31 — End: 2017-02-11

## 2016-12-31 MED FILL — SILDENAFIL 20 MG TABLET: 20 | 30 days supply | Qty: 90 | Fill #0

## 2017-01-02 NOTE — Progress Notes (Signed)
Advanced Heart Failure Clinic Note   Referring Provider: Darylene Price, FNP Primary Care: [facility] Primary Cardiologist: Dr Haroldine Laws.   HPI:  Ronald Miller. is a 61 y.o. male with history of chronic systolic HF s/p Medtronic ICD 2014, CAD s/p CABG in 2000, HTN, Hx of cocaine abuse, Tobacco abuse, Depression, and COPD.   Underwent HM 3 placement 09/09/16. Post-op course complicated by RV failure, volume overload, left pleural effusion and PAF. He was diuresed successfully. Milrinone weaned to off. Underwent ramp echo prior to d/c and speed adjusted. Also underwent left thoracentesis with IR (600 cc off) with moderate improvement in size of effusion. Warfarin loaded. INR 2.0. Co-ox on date of d/c was 52% but he was ambulating well without symptoms. PICC line pulled prior to discharge.   In 10/13/16 had repeat thoracentesis.   Hospitalized from 11/30/16-12/06/16 with volume overload, DOE and orthopnea. Weight was up 15 pounds, mixed venous sat was 50% consistent  low output. He was diuresed with IV Lasix then transitioned to torsemide 40mg  daily. Had a ramp echo on 12/01/16, and speed increased to 5900 -> 6000. The following morning his MV sat was down into 40s with frequent PI events so speed turned back down to 5900 and diuresis continued. Milrinone started. He was discharged on 0.164mcg of Milrinone. Discharge weight was 224 pounds.   He presents today for HF follow up. We saw him last week and was feeling bad. Co-ox was 41% despite milrinone support at 0.25. We attempted to add sildenafil to assist with RV failure but unable to afford. (Our HF PharmD has now found a way for him to get it). Continue to feel weak. Has been taking metolazone almost every day for bloating. Weight down 5 pounds but urine output now slowing. Remains SOB and fatigued with ADLs. No orthopnea or PND. Eating a low salt diet, but drinks > 2L a day, especially eats a lot of ice.  He denies tobacco use or  other substance abuse since getting VAD. Mother and father participating in his care.  Denies LVAD trauma. Denies driveline trauma, erythema, or drainage. No ICD shocks. Family unable to change dressing yesterday so we did it today in clinic   Reports taking Coumadin as prescribed and adherence to anticoagulation based dietary restrictions. Denies bright red blood per rectum or melena, no dark urine or hematuria.    VAD Indication: Destination Therapy- Evaluation completed at [facility] (halted with nicotine/SA).   VAD interrogation & Equipment Management:  Personally reviewed (DB)  Speed: 5900 Flow: 6.0 Power: 4.8 w PI: 2.2  Alarms: no clinical alarms Events:100+ PI for 3/28 events   Fixed speed: 5900 Low speed limit: 5600  Primary Controller: Replace back up battery in 40months. Back up controller: Replace back up battery in 39months.  Labs (12/17): K 4.4, creatinine 0.92, LFTs normal, BNP 524, hgb 9.7, LDH 187, INR 1.75  Past Medical History:  Diagnosis Date  . AICD (automatic cardioverter/defibrillator) present   . ASCVD (arteriosclerotic cardiovascular disease)   . Chronic systolic CHF (congestive heart failure) ([place])   . COPD (chronic obstructive pulmonary disease) ([place])   . Coronary artery disease   . Depression   . Diabetes mellitus   . History of cocaine abuse   . Hypertension   . Presence of permanent cardiac pacemaker   . Shortness of breath dyspnea   . Suicidal ideation   . Tobacco abuse     Current Outpatient Prescriptions  Medication Sig Dispense Refill  .  amiodarone (PACERONE) 200 MG tablet Take 1 tablet (200 mg total) by mouth daily. 60 tablet 6  . aspirin EC 81 MG EC tablet Take 1 tablet (81 mg total) by mouth daily. 30 tablet 6  . citalopram (CELEXA) 10 MG tablet Take 1 tablet (10 mg total) by mouth daily. 30 tablet 6  . digoxin (LANOXIN) 0.125 MG tablet Take 125 mcg by mouth daily.    Marland Kitchen docusate sodium (COLACE) 100 MG capsule Take 2  capsules (200 mg total) by mouth daily. 10 capsule 0  . gabapentin (NEURONTIN) 100 MG capsule Take 2 capsules (200 mg total) by mouth 2 (two) times daily. 60 capsule 6  . insulin aspart (NOVOLOG FLEXPEN) 100 UNIT/ML FlexPen Inject 10 Units into the skin 3 (three) times daily with meals. 15 mL 11  . Insulin Glargine (LANTUS SOLOSTAR) 100 UNIT/ML Solostar Pen Inject 20 Units into the skin daily at 10 pm.    . losartan (COZAAR) 25 MG tablet Take 1 tablet (25 mg total) by mouth daily. 30 tablet 6  . magnesium oxide (MAG-OX) 400 (241.3 Mg) MG tablet Take 1 tablet (400 mg total) by mouth daily. 30 tablet 6  . metolazone (ZAROXOLYN) 2.5 MG tablet Take 1 tablet (2.5 mg total) by mouth daily. 15 tablet 3  . milrinone (PRIMACOR) 20 MG/100 ML SOLN infusion Inject 25.175 mcg/min into the vein continuous. Per [facility] pharmacy 100 mL 6  . pantoprazole (PROTONIX) 40 MG tablet Take 1 tablet (40 mg total) by mouth daily. 30 tablet 6  . potassium chloride SA (K-DUR,KLOR-CON) 20 MEQ tablet Take 1 tablet (20 mEq total) by mouth daily. 30 tablet 6  . spironolactone (ALDACTONE) 25 MG tablet Take 1 tablet (25 mg total) by mouth daily. 30 tablet 6  . torsemide (DEMADEX) 20 MG tablet Take 2 tablets (40 mg total) by mouth 2 (two) times daily. 60 tablet 12  . warfarin (COUMADIN) 5 MG tablet Take 10-15 mg by mouth daily. As directed based on blood checks    . sildenafil (REVATIO) 20 MG tablet Take 1 tablet (20 mg total) by mouth 3 (three) times daily. 90 tablet 2   No current facility-administered medications for this encounter.     Codeine; Trazodone and nefazodone; Lipitor [atorvastatin]; and Tape  REVIEW OF SYSTEMS: All systems negative except as listed in HPI, PMH and Problem list.  Vital Signs:   Personally reviewed (DB) Doppler Pressure:90  Weight today: 236.8 w/ eqt Last Weight: 241 lb w/ eqt   Wt Readings from Last 3 Encounters:  12/29/16 236 lb 12.8 oz (107.4 kg)  12/22/16 230 lb (104.3 kg)  12/06/16  224 lb 11.2 oz (101.9 kg)    Physical Exam: GENERAL: Walked into clinic NAD, does not appear SOB at rest.  HEENT: Normal anicteric NECK: Supple, JVP 7-8 . 2+ bilaterally, no bruits.  No lymphadenopathy or thyromegaly appreciated.   CARDIAC:  Mechanical heart sounds with + LVAD sound. Normal HM3 sounds LUNGS:  CTA. No wheeze or rhonchi  ABDOMEN:  firm, Non tender, nondistended No bruits or masses. +BS  LVAD exit site:  Dressing dry and intact.  No drainage or erytthema. Stabilization device present and adequately applied.  Driveline dressing is being changed daily per sterile technique. EXTREMITIES:  Warm and dry, no cyanosis, clubbing, rash.No edema  NEUROLOGIC:  Alert and oriented x 4.  Gait steady.  No aphasia.  No dysarthria.  Affect flat  ASSESSMENT AND PLAN:  1. Chronic end-stage biventricular systolic HF: LVEF 31% due to ICM ->  s/p Echo 08/24/16 LVEF 15%, RV mild dilated, moderately reduced. --> S/P HM3 LVAD 92/10/1939.   -  Complicated by ongoing RV failure -  NYHA III-IIIB despite VAD support and milrinone at 0.25 - Unable to draw back from PICC today to repeat co-ox. Will increase milrinone to 0.375. Start sildenafil 20 tid - Volume status improved. Continue torsemide. Use metolazone only PRN -  Have discussed with [facility] transplant team (Dr. Hoyt Koch). They have re-initiated transplant w/u  - VAD parameters stable  - Warfarin with goal INR 2.0 - 2.5 + ASA 81 daily. Avoid lovenox in future due to epistaxis. INR is 1.78, pharmacy is dosing coumadin. We discussed personally in clinic - LDH is 233 stable.  - Consider adding Entresto at some point.  2. CAD: No CP. 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  - No angina. Continue current regimen  3. DMII:  - Hgb A1C 8.1 on 09/09/2016. Managed by PCP 4. Tobacco/substance abuse - Says he has not smoked (or used any drugs) since prior to admission for LVAD.  - Will continue routine cotinine screens and UDS as part  of transplant w/u. They have been negative so far.  5. Atrial fibrillation, paroxysmal - NSR on amio.  - Continue Amiodarone 200 mg for now. Can considerdecease to 100mg  daily in future..  6. Left pleural effusion - underwent repeat thoracentesis in Jan. 2018.  -  Stable L small pleural effusion on 12/01/16 with mild bibasilar atelectasis. 7. Epistaxis -- No further nose bleeds.  -- Hgb 10.o  today. MCV low. Check iron stores at next visit. May benefit from [facility]  8. Anxiety/Depression - Continue Celexa 10mg  daily and Trazadone 50mg  hs for sleep.   Glori Bickers, MD  01/02/2017

## 2017-01-03 ENCOUNTER — Telehealth: Payer: Self-pay | Admitting: *Deleted

## 2017-01-03 ENCOUNTER — Ambulatory Visit (HOSPITAL_COMMUNITY)
Admission: RE | Admit: 2017-01-03 | Discharge: 2017-01-03 | Disposition: A | Discharge status: Home / Self Care | Payer: Medicare HMO | Source: Ambulatory Visit | Attending: Internal Medicine | Admitting: Internal Medicine

## 2017-01-03 ENCOUNTER — Encounter: Payer: Self-pay | Admitting: *Deleted

## 2017-01-03 ENCOUNTER — Telehealth: Payer: Self-pay | Admitting: Licensed Clinical Social Worker

## 2017-01-03 DIAGNOSIS — F1410 Cocaine abuse, uncomplicated: Secondary | ICD-10-CM | POA: Diagnosis not present

## 2017-01-03 DIAGNOSIS — I480 Paroxysmal atrial fibrillation: Secondary | ICD-10-CM | POA: Insufficient documentation

## 2017-01-03 DIAGNOSIS — F419 Anxiety disorder, unspecified: Secondary | ICD-10-CM | POA: Diagnosis not present

## 2017-01-03 DIAGNOSIS — R040 Epistaxis: Secondary | ICD-10-CM | POA: Diagnosis not present

## 2017-01-03 DIAGNOSIS — I2510 Atherosclerotic heart disease of native coronary artery without angina pectoris: Secondary | ICD-10-CM | POA: Insufficient documentation

## 2017-01-03 DIAGNOSIS — Z95811 Presence of heart assist device: Secondary | ICD-10-CM

## 2017-01-03 DIAGNOSIS — I50810 Right heart failure, unspecified: Secondary | ICD-10-CM

## 2017-01-03 DIAGNOSIS — Z7982 Long term (current) use of aspirin: Secondary | ICD-10-CM | POA: Diagnosis not present

## 2017-01-03 DIAGNOSIS — Z87891 Personal history of nicotine dependence: Secondary | ICD-10-CM | POA: Insufficient documentation

## 2017-01-03 DIAGNOSIS — Z79899 Other long term (current) drug therapy: Secondary | ICD-10-CM | POA: Insufficient documentation

## 2017-01-03 DIAGNOSIS — Z794 Long term (current) use of insulin: Secondary | ICD-10-CM | POA: Diagnosis not present

## 2017-01-03 DIAGNOSIS — Z7901 Long term (current) use of anticoagulants: Secondary | ICD-10-CM | POA: Insufficient documentation

## 2017-01-03 DIAGNOSIS — E119 Type 2 diabetes mellitus without complications: Secondary | ICD-10-CM | POA: Diagnosis not present

## 2017-01-03 DIAGNOSIS — I5022 Chronic systolic (congestive) heart failure: Secondary | ICD-10-CM | POA: Diagnosis not present

## 2017-01-03 DIAGNOSIS — I509 Heart failure, unspecified: Secondary | ICD-10-CM

## 2017-01-03 DIAGNOSIS — I5023 Acute on chronic systolic (congestive) heart failure: Secondary | ICD-10-CM | POA: Diagnosis not present

## 2017-01-03 DIAGNOSIS — I110 Hypertensive heart disease with heart failure: Secondary | ICD-10-CM | POA: Diagnosis not present

## 2017-01-03 DIAGNOSIS — Z95810 Presence of automatic (implantable) cardiac defibrillator: Secondary | ICD-10-CM | POA: Insufficient documentation

## 2017-01-03 DIAGNOSIS — Z951 Presence of aortocoronary bypass graft: Secondary | ICD-10-CM | POA: Diagnosis not present

## 2017-01-03 DIAGNOSIS — F329 Major depressive disorder, single episode, unspecified: Secondary | ICD-10-CM | POA: Diagnosis not present

## 2017-01-03 LAB — COOXEMETRY PANEL
Carboxyhemoglobin: 1.3 % (ref 0.5–1.5)
Methemoglobin: 1.4 % (ref 0.0–1.5)
O2 SAT: 63.7 %
TOTAL HEMOGLOBIN: 9.4 g/dL — AB (ref 12.0–16.0)

## 2017-01-03 MED ORDER — FUROSEMIDE 10 MG/ML IJ SOLN
80.0000 mg | Freq: Once | INTRAMUSCULAR | Status: DC
  Filled 2017-01-03: qty 8

## 2017-01-03 NOTE — Telephone Encounter (Signed)
Called [name] to check on status of return.  He plans to return to rehab tomorrow.  He was seen in clinic today and was encouraged to stay active.

## 2017-01-03 NOTE — Telephone Encounter (Signed)
CSW contacted patient to follow up on mental health counseling. Patient reports he has a few options now and will call this week for appointment. Patient spoke at length about his upcoming transplant evaluations and all the details involved with the work up. Patient shared concerns of some discomfort around the pump site and mentioned he has an appointment on Wednesday in the [place] clinic. CSW provided supportive intervention and will pass on concerns to VAD Coordinator to return call to patient. CSW continues to encourage and support patient with supportive intervention. Raquel Sarna, [place], [place]

## 2017-01-03 NOTE — Progress Notes (Signed)
Patient presents for urgent visit in [facility] today. Reports no problems with VAD equipment. Complains of pain in abdomen and feels short of breath.   Vital Signs:  Doppler Pressure 95   Automatc BP: 135/80 (96) HR:101   SPO2:94  %  Weight: 240.8 lb w/o eqt Last weight: 236.8 lb Home weights: 235 lbs   VAD Indication: Destination Therapy- Evaluation completed at [facility] (halted with nicotine/SA).    VAD interrogation & Equipment Management: Speed:5900 Flow: 5.5 Power:3.3 w    PI:4.7  Alarms: no clinical alarms Events: 3/30 no external power noted. Patient states the power went out.  Fixed speed 5900 Low speed limit: 5600  Primary Controller:  Replace back up battery in 70months. Back up controller:   Replace back up battery in 27 months.  Annual Equipment Maintenance on UBC/PM was performed on 09/2016.   I reviewed the LVAD parameters from today and compared the results to the patient's prior recorded data. LVAD interrogation was NEGATIVE for significant power changes, NEGATIVE for clinical alarms and STABLE for PI events/speed drops. No programming changes were made and pump is functioning within specified parameters. Pt is performing daily controller and system monitor self tests along with completing weekly and monthly maintenance for LVAD equipment.  LVAD equipment check completed and is in good working order. Back-up equipment present. Charged back up battery and performed self-test on equipment.   Exit Site Care: Drive line is being maintained weekly  by sister. Drive line exit site well healed and incorporated. The velour is fully implanted at exit site. Dressing dry and intact. No erythema or drainage. Stabilization device present and accurately applied. Pt denies fever or chills. Pt states they have adequate dressing supplies at home.   Significant Events on VAD Support:  11/30/16>> Milrinone gtt at discharge   Device:Protecta Therapies: off Last check:  today in clinic   Coox-63.7 today. Lasix 80 mg IVP given.  Return to clinic in Wednesday.  Plan: 1.Increase potassium to 20 mEq twice a day   Balinda Quails RN Croom Coordinator   Office: [phone] 24/7 Emergency VAD Pager: [phone]

## 2017-01-03 NOTE — Progress Notes (Signed)
Advanced Heart Failure Clinic Note   Referring Provider: Darylene Price, FNP Primary Care: [facility] Primary Cardiologist: Dr Haroldine Laws.   HPI: Ronald Miller. is a 61 y.o. male with history of chronic systolic HF s/p [employer] ICD 2014, CAD s/p CABG in 2000, HTN, Hx of cocaine abuse, former tobacco abuse, Depression, and COPD.   Underwent HM 3 placement 09/09/16. Post-op course complicated by RV failure, volume overload, left pleural effusion and PAF. He was diuresed successfully. Milrinone weaned to off. Underwent ramp echo prior to d/c and speed adjusted. Also underwent left thoracentesis with IR (600 cc off) with moderate improvement in size of effusion. Warfarin loaded. INR 2.0. Co-ox on date of d/c was 52% but he was ambulating well without symptoms. PICC line pulled prior to discharge.   In 10/13/16 had repeat thoracentesis.   Hospitalized from 11/30/16-12/06/16 with volume overload, DOE and orthopnea. Weight was up 15 pounds, mixed venous sat was 50% consistent  low output. He was diuresed with IV Lasix then transitioned to torsemide 40mg  daily. Had a ramp echo on 12/01/16, and speed increased to 5900 -> 6000. The following morning his MV sat was down into 40s with frequent PI events so speed turned back down to 5900 and diuresis continued. Milrinone started. He was discharged on 0.143mcg of Milrinone. Discharge weight was 224 pounds.   Today he returns for acute work-in visit due to abdominal bloating and pain over VAD pocket. Complaining of cough and shortness of breath. Over the weekend he had ham, potato salad, and sausage. Eating lots of ice. Weight at home up over 10 pounds now 232-236 pounds. No BRBPR. Urine yellow. Appetite ok. Denies LVAD trauma. Denies driveline trauma, erythema, or drainage. No ICD shocks. Taking all medications.   Reports taking Coumadin as prescribed and adherence to anticoagulation based dietary restrictions. Denies bright red blood per  rectum or melena, no dark urine or hematuria.    VAD Indication: Destination Therapy- [facility] work up started.    VAD interrogation & Equipment Management:  Personally reviewed  Amy Clegg Speed: 5900 Flow: 5.5 Power: 4.7 PI 3.3   Alarms: no clinical alarms Events:  20-40 events   Fixed speed: 5900 Low speed limit: 5600  Primary Controller: Replace back up battery in 65months. Back up controller: Replace back up battery in 67months.  Labs (12/17): K 4.4, creatinine 0.92, LFTs normal, BNP 524, hgb 9.7, LDH 187, INR 1.75  Past Medical History:  Diagnosis Date  . AICD (automatic cardioverter/defibrillator) present   . ASCVD (arteriosclerotic cardiovascular disease)   . Chronic systolic CHF (congestive heart failure) ([place])   . COPD (chronic obstructive pulmonary disease) ([place])   . Coronary artery disease   . Depression   . Diabetes mellitus   . History of cocaine abuse   . Hypertension   . Presence of permanent cardiac pacemaker   . Shortness of breath dyspnea   . Suicidal ideation   . Tobacco abuse     Current Outpatient Prescriptions  Medication Sig Dispense Refill  . amiodarone (PACERONE) 200 MG tablet Take 1 tablet (200 mg total) by mouth daily. 60 tablet 6  . aspirin EC 81 MG EC tablet Take 1 tablet (81 mg total) by mouth daily. 30 tablet 6  . citalopram (CELEXA) 10 MG tablet Take 1 tablet (10 mg total) by mouth daily. 30 tablet 6  . digoxin (LANOXIN) 0.125 MG tablet Take 125 mcg by mouth daily.    Marland Kitchen docusate sodium (COLACE) 100 MG capsule  Take 2 capsules (200 mg total) by mouth daily. 10 capsule 0  . gabapentin (NEURONTIN) 100 MG capsule Take 2 capsules (200 mg total) by mouth 2 (two) times daily. 60 capsule 6  . insulin aspart (NOVOLOG FLEXPEN) 100 UNIT/ML FlexPen Inject 10 Units into the skin 3 (three) times daily with meals. 15 mL 11  . Insulin Glargine (LANTUS SOLOSTAR) 100 UNIT/ML Solostar Pen Inject 20 Units into the skin daily at 10 pm.    .  losartan (COZAAR) 25 MG tablet Take 1 tablet (25 mg total) by mouth daily. 30 tablet 6  . magnesium oxide (MAG-OX) 400 (241.3 Mg) MG tablet Take 1 tablet (400 mg total) by mouth daily. 30 tablet 6  . milrinone (PRIMACOR) 20 MG/100 ML SOLN infusion Inject 25.175 mcg/min into the vein continuous. Per [facility] pharmacy 100 mL 6  . pantoprazole (PROTONIX) 40 MG tablet Take 1 tablet (40 mg total) by mouth daily. 30 tablet 6  . potassium chloride SA (K-DUR,KLOR-CON) 20 MEQ tablet Take 1 tablet (20 mEq total) by mouth daily. (Patient taking differently: Take 20 mEq by mouth 2 (two) times daily. ) 30 tablet 6  . sildenafil (REVATIO) 20 MG tablet Take 1 tablet (20 mg total) by mouth 3 (three) times daily. 90 tablet 2  . spironolactone (ALDACTONE) 25 MG tablet Take 1 tablet (25 mg total) by mouth daily. 30 tablet 6  . torsemide (DEMADEX) 20 MG tablet Take 2 tablets (40 mg total) by mouth 2 (two) times daily. 60 tablet 12  . warfarin (COUMADIN) 5 MG tablet Take 10-15 mg by mouth daily. As directed based on blood checks    . metolazone (ZAROXOLYN) 2.5 MG tablet Take 1 tablet (2.5 mg total) by mouth daily. (Patient not taking: Reported on 01/03/2017) 15 tablet 3   No current facility-administered medications for this encounter.     Codeine; Trazodone and nefazodone; Lipitor [atorvastatin]; and Tape  REVIEW OF SYSTEMS: All systems negative except as listed in HPI, PMH and Problem list.  Vital Signs:   Personally reviewed (DB) Doppler Pressure:90  Weight today: 236.8 w/ eqt Last Weight: 241 lb w/ eqt   Wt Readings from Last 3 Encounters:  12/29/16 236 lb 12.8 oz (107.4 kg)  12/22/16 230 lb (104.3 kg)  12/06/16 224 lb 11.2 oz (101.9 kg)    Physical Exam: GENERAL: Walked into clinic NAD. Marland Kitchen  HEENT: Normal anicteric NECK: Supple, JVP to jaw.  2+ bilaterally, no bruits.  No lymphadenopathy or thyromegaly appreciated.   CARDIAC:  Mechanical heart sounds with + LVAD sound. Normal HM3 sounds LUNGS:  CTAB.  On room air.   ABDOMEN:  NT, ND.  + distended. No bruits or masses. +BS  LVAD exit site:  Dressing dry and intact.  No drainage or erytthema. Stabilization device present and adequately applied.  Driveline dressing is being changed daily per sterile technique. EXTREMITIES:  Warm and dry, no cyanosis, clubbing, rash. R and LLE 1+ edema. RUE PICC NEUROLOGIC:  A&Ox3. Gait steady.  No aphasia.  No dysarthria.  Affect flat  ASSESSMENT AND PLAN:  1. Chronic end-stage biventricular systolic HF: LVEF 61% due to ICM -> s/p Echo 08/24/16 LVEF 15%, RV mild dilated, moderately reduced. --> S/P HM3 LVAD 60/04/3709.   -  Complicated by ongoing RV failure. NYHA III-IIIB despite VAD support and milrinone.  Check CO-OX now.  Continue milrinone 0.375 mcg. Still waiting on sildenafil.  - Volume status elevated in the setting high salt diet. Reinforced low salt diet and limiting fluid  intake.  Give 80 mg IV lasix now in the clinic. Continue torsemide 40 mg twice a day.  - VAD parameters stable  - Warfarin with goal INR 2.0 - 2.5 + ASA 81 daily.  Do the following things EVERYDAY: 1) Weigh yourself in the morning before breakfast. Write it down and keep it in a log. 2) Take your medicines as prescribed 3) Eat low salt foods-Limit salt (sodium) to 2000 mg per day.  4) Stay as active as you can everyday 5) Limit all fluids for the day to less than 2 liters - Avoid lovenox due to nose bleeds.   2. CAD: No CP. 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  - No angina. Continue current regimen  3. DMII:  - Hgb A1C 8.1 on 09/09/2016. Managed by PCP 4. Tobacco/substance abuse - Has not smoked since November.  5. Atrial fibrillation, paroxysmal - Regular pulse.  - Continue Amiodarone 200 mg for now. Can considerdecease to 100mg  daily in future..  6. Left pleural effusion - underwent repeat thoracentesis in Jan. 2018.  -  Stable L small pleural effusion on 12/01/16 with mild bibasilar  atelectasis. 7. Epistaxis -- No further nose bleeds.  8. Anxiety/Depression - Continue Celexa 10mg  daily and Trazadone 50mg  hs for sleep.   Follow up on Wednesday to reassess volume status. Check CO-OX now.   Darrick Grinder, NP  01/03/2017   Patient seen and examined with Darrick Grinder, NP. We discussed all aspects of the encounter. I agree with the assessment and plan as stated above.   ICD interrogated in clinic personally. Volume status down. No VT/AF.  Despite high thoracic impedance he appears significantly volume overloaded with R>>L HF on exam in setting of severe dietary indiscretion. Has mild tendneress over VAD pocket which appears benign. Co-ox done in clinic looks ok on milrinone. I continue to think that anxiety is playing a major role in his symptoms as he calls clinic several times per week to report various symptoms which go away when he is in the hospital.   Will treat with IV lasix today and tomorrow. Stressed need for dietary compliance. Continue milrinone. VAD parameters stable.   Glori Bickers, MD  11:46 PM

## 2017-01-04 ENCOUNTER — Encounter: Payer: Medicare HMO | Attending: Internal Medicine

## 2017-01-04 DIAGNOSIS — I5022 Chronic systolic (congestive) heart failure: Secondary | ICD-10-CM | POA: Insufficient documentation

## 2017-01-04 DIAGNOSIS — Z95811 Presence of heart assist device: Secondary | ICD-10-CM | POA: Insufficient documentation

## 2017-01-05 ENCOUNTER — Ambulatory Visit (HOSPITAL_COMMUNITY): Payer: Self-pay | Admitting: Pharmacist

## 2017-01-05 ENCOUNTER — Ambulatory Visit (HOSPITAL_COMMUNITY)
Admission: RE | Admit: 2017-01-05 | Discharge: 2017-01-05 | Disposition: A | Discharge status: Home / Self Care | Payer: Medicare HMO | Source: Ambulatory Visit | Attending: Internal Medicine | Admitting: Internal Medicine

## 2017-01-05 DIAGNOSIS — I5032 Chronic diastolic (congestive) heart failure: Secondary | ICD-10-CM | POA: Insufficient documentation

## 2017-01-05 DIAGNOSIS — Z95811 Presence of heart assist device: Secondary | ICD-10-CM | POA: Diagnosis not present

## 2017-01-05 DIAGNOSIS — I5022 Chronic systolic (congestive) heart failure: Secondary | ICD-10-CM

## 2017-01-05 DIAGNOSIS — I5023 Acute on chronic systolic (congestive) heart failure: Secondary | ICD-10-CM

## 2017-01-05 LAB — BASIC METABOLIC PANEL
ANION GAP: 9 (ref 5–15)
BUN: 26 mg/dL — ABNORMAL HIGH (ref 6–20)
CALCIUM: 8.6 mg/dL — AB (ref 8.9–10.3)
CHLORIDE: 92 mmol/L — AB (ref 101–111)
CO2: 29 mmol/L (ref 22–32)
Creatinine, Ser: 1.27 mg/dL — ABNORMAL HIGH (ref 0.61–1.24)
GFR calc non Af Amer: 60 mL/min — ABNORMAL LOW (ref >60)
Glucose, Bld: 177 mg/dL — ABNORMAL HIGH (ref 65–99)
Potassium: 5 mmol/L (ref 3.5–5.1)
Sodium: 130 mmol/L — ABNORMAL LOW (ref 135–145)

## 2017-01-05 LAB — CBC
HCT: 31 % — ABNORMAL LOW (ref 39.0–52.0)
HEMOGLOBIN: 9.4 g/dL — AB (ref 13.0–17.0)
MCH: 24.2 pg — AB (ref 26.0–34.0)
MCHC: 30.3 g/dL (ref 30.0–36.0)
MCV: 79.9 fL (ref 78.0–100.0)
Platelets: 177 10*3/uL (ref 150–400)
RBC: 3.88 MIL/uL — AB (ref 4.22–5.81)
RDW: 17.6 % — ABNORMAL HIGH (ref 11.5–15.5)
WBC: 8 10*3/uL (ref 4.0–10.5)

## 2017-01-05 LAB — COOXEMETRY PANEL
Carboxyhemoglobin: 1.3 % (ref 0.5–1.5)
Methemoglobin: 1.3 % (ref 0.0–1.5)
O2 SAT: 75.3 %
TOTAL HEMOGLOBIN: 9.6 g/dL — AB (ref 12.0–16.0)

## 2017-01-05 LAB — LACTATE DEHYDROGENASE: LDH: 237 U/L — ABNORMAL HIGH (ref 98–192)

## 2017-01-05 LAB — PROTIME-INR
INR: 1.88
PROTHROMBIN TIME: 21.9 s — AB (ref 11.4–15.2)

## 2017-01-05 MED FILL — FUROSEMIDE 20 MG/2 ML VIAL: 10 | 2 days supply | Qty: 16 | Fill #0

## 2017-01-05 NOTE — Progress Notes (Signed)
Patient presents for two day  follow up in [facility] today. Reports no problems with VAD equipment or concerns with drive line. States he is feeling better and not as short of breath. Still can't sleep.  Vital Signs:  Doppler Pressure 90   Automatc BP: 107/71 HR:111   SPO2:94  %  Weight: 240.6 lb w/o eqt Last weight: 240.8 lb Home weights: 236 lbs   VAD Indication: DT- eval ongoing at [facility]    VAD interrogation & Equipment Management: Speed:5900 Flow: 5.4 Power:4.6 w    PI:3.6  Alarms: 4/2 NO external power. Patient states he tripped over the plug to MPU. Events: 10-15 daily  Fixed speed 5900 Low speed limit: 5600  Primary Controller:  Replace back up battery in 65months. Back up controller:   Replace back up battery in 26 months.  Annual Equipment Maintenance on UBC/PM was performed on 09/2016.   I reviewed the LVAD parameters from today and compared the results to the patient's prior recorded data. LVAD interrogation was NEGATIVE for significant power changes, NEGATIVE for clinical alarms and STABLE for PI events/speed drops. No programming changes were made and pump is functioning within specified parameters. Pt is performing daily controller and system monitor self tests along with completing weekly and monthly maintenance for LVAD equipment.  LVAD equipment check completed and is in good working order. Back-up equipment present. Charged back up battery and performed self-test on equipment.    BP & Labs:  MAP 90 - Doppler is reflecting MAP  Hgb 9.4 - No S/S of bleeding. Specifically denies melena/BRBPR or nosebleeds.  LDH stable at 237 with established baseline of 180- 250. Denies tea-colored urine. No power elevations noted on interrogation.   Plan: 1.Return to clinic in one week. 2. HH RN will administer 80 mg Lasix IVP Thursday and Friday 3.Take metolazone 2.5 mg Thursday and Friday 30 minutes prior to Lasix. 4. Rolling walker with a seat was  ordered for patient at this visit.   Balinda Quails RN [place] Coordinator   Office: [phone] 24/7 Emergency VAD Pager: [phone]

## 2017-01-05 NOTE — Patient Instructions (Addendum)
.  Your home health nurse will administer Lasix 80mg  IV Thursday and Friday. Please take metolazone 2.5 mg daily for those 2 days. Also take extra potassium 20 mEq for 2 days.   RTC next week.

## 2017-01-06 ENCOUNTER — Other Ambulatory Visit (HOSPITAL_COMMUNITY): Payer: Self-pay | Admitting: *Deleted

## 2017-01-06 MED ORDER — WARFARIN SODIUM 5 MG PO TABS: 10.0000 mg | ORAL_TABLET | Freq: Every day | ORAL | 6 refills | Status: DC

## 2017-01-10 ENCOUNTER — Other Ambulatory Visit: Payer: Self-pay | Admitting: Internal Medicine

## 2017-01-10 ENCOUNTER — Encounter: Payer: Self-pay | Admitting: Internal Medicine

## 2017-01-11 ENCOUNTER — Ambulatory Visit (HOSPITAL_COMMUNITY): Payer: Self-pay | Admitting: Pharmacist

## 2017-01-11 ENCOUNTER — Telehealth: Payer: Self-pay | Admitting: *Deleted

## 2017-01-11 ENCOUNTER — Ambulatory Visit (HOSPITAL_COMMUNITY)
Admission: RE | Admit: 2017-01-11 | Discharge: 2017-01-11 | Disposition: A | Discharge status: Home / Self Care | Payer: Medicare HMO | Source: Ambulatory Visit | Attending: Cardiology | Admitting: Cardiology

## 2017-01-11 ENCOUNTER — Telehealth (HOSPITAL_COMMUNITY): Payer: Self-pay | Admitting: *Deleted

## 2017-01-11 VITALS — HR 84 | Wt 238.0 lb

## 2017-01-11 DIAGNOSIS — I2510 Atherosclerotic heart disease of native coronary artery without angina pectoris: Secondary | ICD-10-CM | POA: Diagnosis not present

## 2017-01-11 DIAGNOSIS — I480 Paroxysmal atrial fibrillation: Secondary | ICD-10-CM

## 2017-01-11 DIAGNOSIS — Z79899 Other long term (current) drug therapy: Secondary | ICD-10-CM

## 2017-01-11 DIAGNOSIS — Z951 Presence of aortocoronary bypass graft: Secondary | ICD-10-CM | POA: Insufficient documentation

## 2017-01-11 DIAGNOSIS — I110 Hypertensive heart disease with heart failure: Secondary | ICD-10-CM | POA: Diagnosis not present

## 2017-01-11 DIAGNOSIS — Z95811 Presence of heart assist device: Secondary | ICD-10-CM | POA: Diagnosis present

## 2017-01-11 DIAGNOSIS — Z7901 Long term (current) use of anticoagulants: Secondary | ICD-10-CM | POA: Diagnosis not present

## 2017-01-11 DIAGNOSIS — Z720 Tobacco use: Secondary | ICD-10-CM | POA: Diagnosis not present

## 2017-01-11 DIAGNOSIS — I5022 Chronic systolic (congestive) heart failure: Secondary | ICD-10-CM | POA: Diagnosis not present

## 2017-01-11 DIAGNOSIS — I5020 Unspecified systolic (congestive) heart failure: Secondary | ICD-10-CM

## 2017-01-11 DIAGNOSIS — F17201 Nicotine dependence, unspecified, in remission: Secondary | ICD-10-CM

## 2017-01-11 LAB — PROTIME-INR
INR: 2.91
PROTHROMBIN TIME: 31 s — AB (ref 11.4–15.2)

## 2017-01-11 MED ORDER — TORSEMIDE 20 MG PO TABS: ORAL_TABLET | ORAL | 12 refills | Status: DC

## 2017-01-11 NOTE — Progress Notes (Signed)
Patient presents for same day visit in [facility] today. Reports no problems with VAD equipment or concerns with drive line. [facility] paged [place] pager stating patient was up in weight.  Vital Signs:  Doppler Pressure 100     VAD interrogation & Equipment Management: Speed:5900 Flow: 5.4 Power:4.7 w    PI:2.9  Alarms: no clinical alarms Events:  4/8 and 4/4 NO External power noted. Patient stated he accidentally unplugged himself during housework. Instructed patient to use batteries during waking hours.  Fixed speed 5900 Low speed limit: 5600   I reviewed the LVAD parameters from today and compared the results to the patient's prior recorded data. LVAD interrogation was NEGATIVE for significant power changes, NEGATIVE for clinical alarms and STABLE for PI events/speed drops. No programming changes were made and pump is functioning within specified parameters. Pt is performing daily controller and system monitor self tests along with completing weekly and monthly maintenance for LVAD equipment.  LVAD equipment check completed and is in good working order. Back-up equipment present. Charged back up battery and performed self-test on equipment.   Exit Site Care: Drive line is being maintained weekly  by sister. Drive line exit site well healed and incorporated. The velour is fully implanted at exit site. Dressing dry and intact. No erythema or drainage. Stabilization device present and accurately applied. Pt denies fever or chills. Pt states they have adequate dressing supplies at home.   Significant Events on VAD Support:  11/30/16> Milrinone gtt at discharge  Device:Protecta Therapies: off Last check: 01/03/17  BP & Labs:  MAP 100  - Doppler is reflecting MAP  Plan:  1.Return to clinic in one week. 2. Start taking Torsemide 60 mg in the morning and 40 mg in the evening 3. Re-educated about heart failure diet  Balinda Quails RN [place] Coordinator   Office: [phone] 24/7  Emergency VAD Pager: [phone]

## 2017-01-11 NOTE — Patient Instructions (Signed)
Return to clinic next week.  Start taking Torsemide 3 tablet in the morning and two tablets in the evening.  Keep up the good work! You look great today!

## 2017-01-11 NOTE — Telephone Encounter (Signed)
Paged by Lattie Haw, RN with home health  who stated patient gained 11 pounds overnight. Patient states he is not short of breath and feels fine. Discussed with Dr Aundra Dubin who instructed to give patient 80 mg IV Lasix today. Lattie Haw made aware and plans to administer later this morning.

## 2017-01-11 NOTE — Telephone Encounter (Signed)
Ronald Miller was out again today.  He had a leak in his milirone.  He will try again for Thursday.

## 2017-01-11 NOTE — Progress Notes (Signed)
Advanced Heart Failure Clinic Note   Referring Provider: Darylene Price, FNP Primary Care: [facility] Primary Cardiologist: Dr Haroldine Laws.   HPI: Ronald Miller. is a 62 y.o. male with history of chronic systolic HF s/p [employer] ICD 2014, CAD s/p CABG in 2000, HTN, Hx of cocaine abuse, Tobacco abuse, Depression, and COPD. Had thoracentesis 10/2016.   Underwent HM 3 placement 09/09/16. RV failure. Discharged on milrinone 0.125 mcg. Later weaned off.   Hospitalized from 11/30/16-12/06/16 with volume overload, DOE and orthopnea. Weight was up 15 pounds, mixed venous sat was 50% consistent  low output. He was diuresed with IV Lasix then transitioned to torsemide 35m daily. Had a ramp echo on 12/01/16, and speed increased to 5900 -> 6000. The following morning his CO-OX sat was down into 40s with frequent PI events so speed turned back down to 5900 and diuresis continued. Milrinone started. He was discharged on 0.1277m of Milrinone. Discharge weight was 224 pounds.   He presentes for LVAD follow up. Last visit milrinone was increased to 0.375 mcg and he was given 80 mg IV lasix. Overall feeling much better. Able to walk more. Denies SOB/PND/Orthopnea. He does not know what his weight has been. Eating high salt food and had spagetti last night. [facility]. No BRBPR. No hematuria. Lives alone.    VAD Indication: Dest[facility]   VAD interrogation & Equipment Management: Personally reviewed by AmDarrick Grinder Speed: 5900 Flow:5.4  Power: 4.8 PI: 3.0   Alarms: no clinical alarms Events: On 4/4 and 01/09/2017 he had no external power. Says he accidentally disconnected.  Fixed speed: 5900 Low speed limit: 5600  Primary Controller: Replace back up battery in 278monthBack up controller: Replace back up battery in [id number] Results (from the past  2160 hour(s))  Lactate Dehydrogenase (LDH)     Status: None   Collection Time: 10/26/16  1:06 PM  Result Value Ref Range   LDH 181 98 - 192 U/L  Basic metabolic panel     Status: Abnormal   Collection Time: 10/26/16  1:30 PM  Result Value Ref Range   Sodium 136 135 - 145 mmol/L   Potassium 3.8 3.5 - 5.1 mmol/L   Chloride 97 (L) 101 - 111 mmol/L   CO2 32 22 - 32 mmol/L   Glucose, Bld 209 (H) 65 - 99 mg/dL   BUN 17 6 - 20 mg/dL   Creatinine, Ser 0.94 0.61 - 1.24 mg/dL   Calcium 8.8 (L) 8.9 - 10.3 mg/dL   GFR calc non Af Amer >60 >60 mL/min   GFR calc Af Amer >60 >60 mL/min    Comment: (NOTE) The eGFR has been calculated using the CKD EPI equation. This calculation has not been validated in all clinical situations. eGFR's persistently <60 mL/min signify possible Chronic Kidney Disease.    Anion gap 7 5 - 15  Protime-INR     Status: Abnormal   Collection Time: 10/26/16  1:30 PM  Result Value Ref Range   Prothrombin Time 16.4 (H) 11.4 - 15.2 seconds   INR 1.31   CBC     Status: Abnormal   Collection Time: 10/26/16  1:30 PM  Result Value Ref Range   WBC 9.4 4.0 - 10.5 K/uL   RBC 4.00 (L) 4.22 - 5.81 MIL/uL   Hemoglobin 11.0 (L) 13.0 - 17.0 g/dL   HCT 35.3 (L)  39.0 - 52.0 %   MCV 88.3 78.0 - 100.0 fL   MCH 27.5 26.0 - 34.0 pg   MCHC 31.2 30.0 - 36.0 g/dL   RDW 15.8 (H) 11.5 - 15.5 %   Platelets 163 150 - 400 K/uL  INR/PT     Status: Abnormal   Collection Time: 10/28/16 10:32 AM  Result Value Ref Range   Prothrombin Time 18.5 (H) 11.4 - 15.2 seconds   INR 1.52   CBC     Status: Abnormal   Collection Time: 10/28/16 10:32 AM  Result Value Ref Range   WBC 9.3 4.0 - 10.5 K/uL   RBC 4.17 (L) 4.22 - 5.81 MIL/uL   Hemoglobin 11.6 (L) 13.0 - 17.0 g/dL   HCT 37.1 (L) 39.0 - 52.0 %   MCV 89.0 78.0 - 100.0 fL   MCH 27.8 26.0 - 34.0 pg   MCHC 31.3 30.0 - 36.0 g/dL   RDW 16.2 (H) 11.5 - 15.5 %   Platelets 153 150 - 400 K/uL  INR/PT     Status: Abnormal   Collection Time:  11/02/16 10:08 AM  Result Value Ref Range   Prothrombin Time 19.9 (H) 11.4 - 15.2 seconds   INR 1.67   CBC     Status: Abnormal   Collection Time: 11/10/16 11:10 AM  Result Value Ref Range   WBC 6.5 4.0 - 10.5 K/uL   RBC 3.71 (L) 4.22 - 5.81 MIL/uL   Hemoglobin 9.8 (L) 13.0 - 17.0 g/dL   HCT 32.5 (L) 39.0 - 52.0 %   MCV 87.6 78.0 - 100.0 fL   MCH 26.4 26.0 - 34.0 pg   MCHC 30.2 30.0 - 36.0 g/dL   RDW 15.6 (H) 11.5 - 15.5 %   Platelets 190 150 - 400 K/uL  Basic metabolic panel     Status: Abnormal   Collection Time: 11/10/16 11:10 AM  Result Value Ref Range   Sodium 138 135 - 145 mmol/L   Potassium 3.5 3.5 - 5.1 mmol/L   Chloride 95 (L) 101 - 111 mmol/L   CO2 33 (H) 22 - 32 mmol/L   Glucose, Bld 184 (H) 65 - 99 mg/dL   BUN 12 6 - 20 mg/dL   Creatinine, Ser 0.90 0.61 - 1.24 mg/dL   Calcium 8.7 (L) 8.9 - 10.3 mg/dL   GFR calc non Af Amer >60 >60 mL/min   GFR calc Af Amer >60 >60 mL/min    Comment: (NOTE) The eGFR has been calculated using the CKD EPI equation. This calculation has not been validated in all clinical situations. eGFR's persistently <60 mL/min signify possible Chronic Kidney Disease.    Anion gap 10 5 - 15  Protime-INR     Status: Abnormal   Collection Time: 11/10/16 11:10 AM  Result Value Ref Range   Prothrombin Time 22.8 (H) 11.4 - 15.2 seconds   INR 1.98   Lactate dehydrogenase     Status: None   Collection Time: 11/10/16 11:10 AM  Result Value Ref Range   LDH 171 98 - 192 U/L  Protime-INR     Status: Abnormal   Collection Time: 11/17/16 11:03 AM  Result Value Ref Range   Prothrombin Time 31.9 (H) 11.4 - 15.2 seconds   INR 3.01   Rapid urine drug screen (hospital performed)     Status: None   Collection Time: 11/17/16 12:51 PM  Result Value Ref Range   Opiates NONE DETECTED NONE DETECTED   Cocaine NONE DETECTED NONE DETECTED  Benzodiazepines NONE DETECTED NONE DETECTED   Amphetamines NONE DETECTED NONE DETECTED   Tetrahydrocannabinol NONE  DETECTED NONE DETECTED   Barbiturates NONE DETECTED NONE DETECTED    Comment:        DRUG SCREEN FOR MEDICAL PURPOSES ONLY.  IF CONFIRMATION IS NEEDED FOR ANY PURPOSE, NOTIFY LAB WITHIN 5 DAYS.        LOWEST DETECTABLE LIMITS FOR URINE DRUG SCREEN Drug Class       Cutoff (ng/mL) Amphetamine      1000 Barbiturate      200 Benzodiazepine   585 Tricyclics       277 Opiates          300 Cocaine          300 THC              50   Glucose, capillary     Status: Abnormal   Collection Time: 11/23/16  8:22 AM  Result Value Ref Range   Glucose-Capillary 160 (H) 65 - 99 mg/dL  Glucose, capillary     Status: Abnormal   Collection Time: 11/23/16 10:10 AM  Result Value Ref Range   Glucose-Capillary 159 (H) 65 - 99 mg/dL  Glucose, capillary     Status: Abnormal   Collection Time: 11/25/16  8:48 AM  Result Value Ref Range   Glucose-Capillary 170 (H) 65 - 99 mg/dL  Glucose, capillary     Status: Abnormal   Collection Time: 11/25/16  9:52 AM  Result Value Ref Range   Glucose-Capillary 189 (H) 65 - 99 mg/dL  INR/PT     Status: Abnormal   Collection Time: 11/25/16 11:16 AM  Result Value Ref Range   Prothrombin Time 16.0 (H) 11.4 - 15.2 seconds   INR 1.28   INR/PT     Status: Abnormal   Collection Time: 11/30/16 10:44 AM  Result Value Ref Range   Prothrombin Time 15.6 (H) 11.4 - 15.2 seconds   INR 1.23   Lactate Dehydrogenase (LDH)     Status: Abnormal   Collection Time: 11/30/16 10:44 AM  Result Value Ref Range   LDH 198 (H) 98 - 192 U/L  CBC     Status: Abnormal   Collection Time: 11/30/16 11:58 AM  Result Value Ref Range   WBC 7.4 4.0 - 10.5 K/uL   RBC 3.61 (L) 4.22 - 5.81 MIL/uL   Hemoglobin 9.1 (L) 13.0 - 17.0 g/dL   HCT 30.9 (L) 39.0 - 52.0 %   MCV 85.6 78.0 - 100.0 fL   MCH 25.2 (L) 26.0 - 34.0 pg   MCHC 29.4 (L) 30.0 - 36.0 g/dL   RDW 15.8 (H) 11.5 - 15.5 %   Platelets 175 150 - 400 K/uL  Comprehensive metabolic panel     Status: Abnormal   Collection Time: 11/30/16  11:58 AM  Result Value Ref Range   Sodium 137 135 - 145 mmol/L   Potassium 3.5 3.5 - 5.1 mmol/L   Chloride 94 (L) 101 - 111 mmol/L   CO2 35 (H) 22 - 32 mmol/L   Glucose, Bld 134 (H) 65 - 99 mg/dL   BUN 15 6 - 20 mg/dL   Creatinine, Ser 1.06 0.61 - 1.24 mg/dL   Calcium 8.6 (L) 8.9 - 10.3 mg/dL   Total Protein 7.1 6.5 - 8.1 g/dL   Albumin 3.4 (L) 3.5 - 5.0 g/dL   AST 23 15 - 41 U/L   ALT 17 17 - 63 U/L   Alkaline Phosphatase 92 38 -  126 U/L   Total Bilirubin 0.4 0.3 - 1.2 mg/dL   GFR calc non Af Amer >60 >60 mL/min   GFR calc Af Amer >60 >60 mL/min    Comment: (NOTE) The eGFR has been calculated using the CKD EPI equation. This calculation has not been validated in all clinical situations. eGFR's persistently <60 mL/min signify possible Chronic Kidney Disease.    Anion gap 8 5 - 15  Brain natriuretic peptide     Status: Abnormal   Collection Time: 11/30/16 11:58 AM  Result Value Ref Range   B Natriuretic Peptide 565.5 (H) 0.0 - 100.0 pg/mL  MRSA PCR Screening     Status: None   Collection Time: 11/30/16  2:10 PM  Result Value Ref Range   MRSA by PCR NEGATIVE NEGATIVE    Comment:        The GeneXpert MRSA Assay (FDA approved for NASAL specimens only), is one component of a comprehensive MRSA colonization surveillance program. It is not intended to diagnose MRSA infection nor to guide or monitor treatment for MRSA infections.   Glucose, capillary     Status: Abnormal   Collection Time: 11/30/16  9:29 PM  Result Value Ref Range   Glucose-Capillary 213 (H) 65 - 99 mg/dL  .Cooxemetry Panel (carboxy, met, total hgb, O2 sat)     Status: Abnormal   Collection Time: 12/01/16  5:00 AM  Result Value Ref Range   Total hemoglobin 8.6 (L) 12.0 - 16.0 g/dL   O2 Saturation 50.0 %   Carboxyhemoglobin 1.2 0.5 - 1.5 %   Methemoglobin 0.6 0.0 - 1.5 %  Protime-INR     Status: Abnormal   Collection Time: 12/01/16  6:36 AM  Result Value Ref Range   Prothrombin Time 15.6 (H) 11.4 -  15.2 seconds   INR 1.23   Lactate dehydrogenase     Status: None   Collection Time: 12/01/16  6:36 AM  Result Value Ref Range   LDH 170 98 - 192 U/L  CBC     Status: Abnormal   Collection Time: 12/01/16  6:36 AM  Result Value Ref Range   WBC 6.4 4.0 - 10.5 K/uL   RBC 3.65 (L) 4.22 - 5.81 MIL/uL   Hemoglobin 9.2 (L) 13.0 - 17.0 g/dL   HCT 30.9 (L) 39.0 - 52.0 %   MCV 84.7 78.0 - 100.0 fL   MCH 25.2 (L) 26.0 - 34.0 pg   MCHC 29.8 (L) 30.0 - 36.0 g/dL   RDW 15.7 (H) 11.5 - 15.5 %   Platelets 178 150 - 400 K/uL  Basic metabolic panel     Status: Abnormal   Collection Time: 12/01/16  6:36 AM  Result Value Ref Range   Sodium 138 135 - 145 mmol/L   Potassium 3.6 3.5 - 5.1 mmol/L   Chloride 92 (L) 101 - 111 mmol/L   CO2 36 (H) 22 - 32 mmol/L   Glucose, Bld 213 (H) 65 - 99 mg/dL   BUN 18 6 - 20 mg/dL   Creatinine, Ser 1.04 0.61 - 1.24 mg/dL   Calcium 8.5 (L) 8.9 - 10.3 mg/dL   GFR calc non Af Amer >60 >60 mL/min   GFR calc Af Amer >60 >60 mL/min    Comment: (NOTE) The eGFR has been calculated using the CKD EPI equation. This calculation has not been validated in all clinical situations. eGFR's persistently <60 mL/min signify possible Chronic Kidney Disease.    Anion gap 10 5 - 15  Glucose, capillary  Status: Abnormal   Collection Time: 12/01/16  8:53 AM  Result Value Ref Range   Glucose-Capillary 218 (H) 65 - 99 mg/dL  Glucose, capillary     Status: Abnormal   Collection Time: 12/01/16 11:51 AM  Result Value Ref Range   Glucose-Capillary 211 (H) 65 - 99 mg/dL  ECHOCARDIOGRAM LIMITED     Status: None   Collection Time: 12/01/16  1:50 PM  Result Value Ref Range   Weight 3,696 oz   Height 70 in   BP 118/85 mmHg  Glucose, capillary     Status: Abnormal   Collection Time: 12/01/16  5:00 PM  Result Value Ref Range   Glucose-Capillary 160 (H) 65 - 99 mg/dL  Glucose, capillary     Status: Abnormal   Collection Time: 12/01/16  9:12 PM  Result Value Ref Range    Glucose-Capillary 170 (H) 65 - 99 mg/dL  Protime-INR     Status: Abnormal   Collection Time: 12/02/16  4:45 AM  Result Value Ref Range   Prothrombin Time 15.6 (H) 11.4 - 15.2 seconds   INR 1.24   Lactate dehydrogenase     Status: None   Collection Time: 12/02/16  4:45 AM  Result Value Ref Range   LDH 171 98 - 192 U/L  CBC     Status: Abnormal   Collection Time: 12/02/16  4:45 AM  Result Value Ref Range   WBC 7.0 4.0 - 10.5 K/uL   RBC 3.80 (L) 4.22 - 5.81 MIL/uL   Hemoglobin 9.5 (L) 13.0 - 17.0 g/dL   HCT 31.9 (L) 39.0 - 52.0 %   MCV 83.9 78.0 - 100.0 fL   MCH 25.0 (L) 26.0 - 34.0 pg   MCHC 29.8 (L) 30.0 - 36.0 g/dL   RDW 15.9 (H) 11.5 - 15.5 %   Platelets 188 150 - 400 K/uL  Basic metabolic panel     Status: Abnormal   Collection Time: 12/02/16  4:45 AM  Result Value Ref Range   Sodium 137 135 - 145 mmol/L   Potassium 3.8 3.5 - 5.1 mmol/L   Chloride 94 (L) 101 - 111 mmol/L   CO2 36 (H) 22 - 32 mmol/L   Glucose, Bld 131 (H) 65 - 99 mg/dL   BUN 18 6 - 20 mg/dL   Creatinine, Ser 1.05 0.61 - 1.24 mg/dL   Calcium 8.6 (L) 8.9 - 10.3 mg/dL   GFR calc non Af Amer >60 >60 mL/min   GFR calc Af Amer >60 >60 mL/min    Comment: (NOTE) The eGFR has been calculated using the CKD EPI equation. This calculation has not been validated in all clinical situations. eGFR's persistently <60 mL/min signify possible Chronic Kidney Disease.    Anion gap 7 5 - 15  .Cooxemetry Panel (carboxy, met, total hgb, O2 sat)     Status: Abnormal   Collection Time: 12/02/16  5:12 AM  Result Value Ref Range   Total hemoglobin 11.1 (L) 12.0 - 16.0 g/dL   O2 Saturation 38.6 %   Carboxyhemoglobin 1.0 0.5 - 1.5 %   Methemoglobin 1.1 0.0 - 1.5 %  .Cooxemetry Panel (carboxy, met, total hgb, O2 sat)     Status: Abnormal   Collection Time: 12/02/16  7:20 AM  Result Value Ref Range   Total hemoglobin 9.7 (L) 12.0 - 16.0 g/dL   O2 Saturation 62.2 %   Carboxyhemoglobin 1.7 (H) 0.5 - 1.5 %   Methemoglobin 0.9  0.0 - 1.5 %  Glucose, capillary  Status: Abnormal   Collection Time: 12/02/16  8:43 AM  Result Value Ref Range   Glucose-Capillary 119 (H) 65 - 99 mg/dL  Glucose, capillary     Status: Abnormal   Collection Time: 12/02/16 11:32 AM  Result Value Ref Range   Glucose-Capillary 147 (H) 65 - 99 mg/dL  .Cooxemetry Panel (carboxy, met, total hgb, O2 sat)     Status: Abnormal   Collection Time: 12/02/16  2:00 PM  Result Value Ref Range   Total hemoglobin 9.8 (L) 12.0 - 16.0 g/dL   O2 Saturation 43.0 %   Carboxyhemoglobin 1.2 0.5 - 1.5 %   Methemoglobin 1.2 0.0 - 1.5 %  Glucose, capillary     Status: Abnormal   Collection Time: 12/02/16  3:35 PM  Result Value Ref Range   Glucose-Capillary 138 (H) 65 - 99 mg/dL  Glucose, capillary     Status: Abnormal   Collection Time: 12/02/16  9:13 PM  Result Value Ref Range   Glucose-Capillary 220 (H) 65 - 99 mg/dL  Heparin level (unfractionated)     Status: Abnormal   Collection Time: 12/02/16  9:59 PM  Result Value Ref Range   Heparin Unfractionated 0.17 (L) 0.30 - 0.70 IU/mL    Comment:        IF HEPARIN RESULTS ARE BELOW EXPECTED VALUES, AND PATIENT DOSAGE HAS BEEN CONFIRMED, SUGGEST FOLLOW UP TESTING OF ANTITHROMBIN III LEVELS.   Heparin level (unfractionated)     Status: Abnormal   Collection Time: 12/03/16  3:46 AM  Result Value Ref Range   Heparin Unfractionated 0.27 (L) 0.30 - 0.70 IU/mL    Comment:        IF HEPARIN RESULTS ARE BELOW EXPECTED VALUES, AND PATIENT DOSAGE HAS BEEN CONFIRMED, SUGGEST FOLLOW UP TESTING OF ANTITHROMBIN III LEVELS.   Basic metabolic panel     Status: Abnormal   Collection Time: 12/03/16  3:46 AM  Result Value Ref Range   Sodium 133 (L) 135 - 145 mmol/L   Potassium 3.7 3.5 - 5.1 mmol/L   Chloride 92 (L) 101 - 111 mmol/L   CO2 32 22 - 32 mmol/L   Glucose, Bld 150 (H) 65 - 99 mg/dL   BUN 18 6 - 20 mg/dL   Creatinine, Ser 1.01 0.61 - 1.24 mg/dL   Calcium 8.6 (L) 8.9 - 10.3 mg/dL   GFR calc non Af  Amer >60 >60 mL/min   GFR calc Af Amer >60 >60 mL/min    Comment: (NOTE) The eGFR has been calculated using the CKD EPI equation. This calculation has not been validated in all clinical situations. eGFR's persistently <60 mL/min signify possible Chronic Kidney Disease.    Anion gap 9 5 - 15  CBC     Status: Abnormal   Collection Time: 12/03/16  3:46 AM  Result Value Ref Range   WBC 6.6 4.0 - 10.5 K/uL   RBC 3.77 (L) 4.22 - 5.81 MIL/uL   Hemoglobin 9.3 (L) 13.0 - 17.0 g/dL   HCT 31.4 (L) 39.0 - 52.0 %   MCV 83.3 78.0 - 100.0 fL   MCH 24.7 (L) 26.0 - 34.0 pg   MCHC 29.6 (L) 30.0 - 36.0 g/dL   RDW 15.9 (H) 11.5 - 15.5 %   Platelets 188 150 - 400 K/uL  Lactate dehydrogenase     Status: None   Collection Time: 12/03/16  3:46 AM  Result Value Ref Range   LDH 171 98 - 192 U/L  Protime-INR     Status:  Abnormal   Collection Time: 12/03/16  3:46 AM  Result Value Ref Range   Prothrombin Time 17.7 (H) 11.4 - 15.2 seconds   INR 1.44   .Cooxemetry Panel (carboxy, met, total hgb, O2 sat)     Status: Abnormal   Collection Time: 12/03/16  3:55 AM  Result Value Ref Range   Total hemoglobin 9.5 (L) 12.0 - 16.0 g/dL   O2 Saturation 61.5 %   Carboxyhemoglobin 1.3 0.5 - 1.5 %   Methemoglobin 0.8 0.0 - 1.5 %  .Cooxemetry Panel (carboxy, met, total hgb, O2 sat)     Status: Abnormal   Collection Time: 12/03/16  5:00 AM  Result Value Ref Range   Total hemoglobin 9.0 (L) 12.0 - 16.0 g/dL   O2 Saturation 54.5 %   Carboxyhemoglobin 1.3 0.5 - 1.5 %   Methemoglobin 1.1 0.0 - 1.5 %  Glucose, capillary     Status: Abnormal   Collection Time: 12/03/16  8:22 AM  Result Value Ref Range   Glucose-Capillary 127 (H) 65 - 99 mg/dL  Glucose, capillary     Status: Abnormal   Collection Time: 12/03/16 11:35 AM  Result Value Ref Range   Glucose-Capillary 214 (H) 65 - 99 mg/dL  Heparin level (unfractionated)     Status: None   Collection Time: 12/03/16 12:30 PM  Result Value Ref Range   Heparin  Unfractionated 0.40 0.30 - 0.70 IU/mL    Comment:        IF HEPARIN RESULTS ARE BELOW EXPECTED VALUES, AND PATIENT DOSAGE HAS BEEN CONFIRMED, SUGGEST FOLLOW UP TESTING OF ANTITHROMBIN III LEVELS.   Glucose, capillary     Status: Abnormal   Collection Time: 12/03/16  3:50 PM  Result Value Ref Range   Glucose-Capillary 135 (H) 65 - 99 mg/dL  Glucose, capillary     Status: Abnormal   Collection Time: 12/03/16  9:45 PM  Result Value Ref Range   Glucose-Capillary 139 (H) 65 - 99 mg/dL  Protime-INR     Status: Abnormal   Collection Time: 12/04/16  4:06 AM  Result Value Ref Range   Prothrombin Time 20.0 (H) 11.4 - 15.2 seconds   INR 1.68   Lactate dehydrogenase     Status: None   Collection Time: 12/04/16  4:06 AM  Result Value Ref Range   LDH 163 98 - 192 U/L  Heparin level (unfractionated)     Status: None   Collection Time: 12/04/16  4:06 AM  Result Value Ref Range   Heparin Unfractionated 0.45 0.30 - 0.70 IU/mL    Comment:        IF HEPARIN RESULTS ARE BELOW EXPECTED VALUES, AND PATIENT DOSAGE HAS BEEN CONFIRMED, SUGGEST FOLLOW UP TESTING OF ANTITHROMBIN III LEVELS.   Basic metabolic panel     Status: Abnormal   Collection Time: 12/04/16  4:06 AM  Result Value Ref Range   Sodium 136 135 - 145 mmol/L   Potassium 3.8 3.5 - 5.1 mmol/L   Chloride 93 (L) 101 - 111 mmol/L   CO2 34 (H) 22 - 32 mmol/L   Glucose, Bld 169 (H) 65 - 99 mg/dL   BUN 17 6 - 20 mg/dL   Creatinine, Ser 1.05 0.61 - 1.24 mg/dL   Calcium 8.7 (L) 8.9 - 10.3 mg/dL   GFR calc non Af Amer >60 >60 mL/min   GFR calc Af Amer >60 >60 mL/min    Comment: (NOTE) The eGFR has been calculated using the CKD EPI equation. This calculation has not been  validated in all clinical situations. eGFR's persistently <60 mL/min signify possible Chronic Kidney Disease.    Anion gap 9 5 - 15  CBC     Status: Abnormal   Collection Time: 12/04/16  4:06 AM  Result Value Ref Range   WBC 5.8 4.0 - 10.5 K/uL   RBC 3.61 (L) 4.22  - 5.81 MIL/uL   Hemoglobin 9.1 (L) 13.0 - 17.0 g/dL   HCT 30.1 (L) 39.0 - 52.0 %   MCV 83.4 78.0 - 100.0 fL   MCH 25.2 (L) 26.0 - 34.0 pg   MCHC 30.2 30.0 - 36.0 g/dL   RDW 15.8 (H) 11.5 - 15.5 %   Platelets 183 150 - 400 K/uL  .Cooxemetry Panel (carboxy, met, total hgb, O2 sat)     Status: Abnormal   Collection Time: 12/04/16  5:00 AM  Result Value Ref Range   Total hemoglobin 9.0 (L) 12.0 - 16.0 g/dL   O2 Saturation 68.5 %   Carboxyhemoglobin 1.8 (H) 0.5 - 1.5 %   Methemoglobin 1.0 0.0 - 1.5 %  Glucose, capillary     Status: Abnormal   Collection Time: 12/04/16  7:58 AM  Result Value Ref Range   Glucose-Capillary 202 (H) 65 - 99 mg/dL  Glucose, capillary     Status: Abnormal   Collection Time: 12/04/16 11:56 AM  Result Value Ref Range   Glucose-Capillary 124 (H) 65 - 99 mg/dL  Glucose, capillary     Status: Abnormal   Collection Time: 12/04/16  5:06 PM  Result Value Ref Range   Glucose-Capillary 117 (H) 65 - 99 mg/dL  Glucose, capillary     Status: Abnormal   Collection Time: 12/04/16  9:11 PM  Result Value Ref Range   Glucose-Capillary 119 (H) 65 - 99 mg/dL  Lactate dehydrogenase     Status: None   Collection Time: 12/05/16  3:52 AM  Result Value Ref Range   LDH 171 98 - 192 U/L  Heparin level (unfractionated)     Status: None   Collection Time: 12/05/16  3:52 AM  Result Value Ref Range   Heparin Unfractionated 0.53 0.30 - 0.70 IU/mL    Comment:        IF HEPARIN RESULTS ARE BELOW EXPECTED VALUES, AND PATIENT DOSAGE HAS BEEN CONFIRMED, SUGGEST FOLLOW UP TESTING OF ANTITHROMBIN III LEVELS.   Basic metabolic panel     Status: Abnormal   Collection Time: 12/05/16  3:52 AM  Result Value Ref Range   Sodium 137 135 - 145 mmol/L   Potassium 4.0 3.5 - 5.1 mmol/L   Chloride 94 (L) 101 - 111 mmol/L   CO2 36 (H) 22 - 32 mmol/L   Glucose, Bld 142 (H) 65 - 99 mg/dL   BUN 18 6 - 20 mg/dL   Creatinine, Ser 1.11 0.61 - 1.24 mg/dL   Calcium 8.7 (L) 8.9 - 10.3 mg/dL   GFR  calc non Af Amer >60 >60 mL/min   GFR calc Af Amer >60 >60 mL/min    Comment: (NOTE) The eGFR has been calculated using the CKD EPI equation. This calculation has not been validated in all clinical situations. eGFR's persistently <60 mL/min signify possible Chronic Kidney Disease.    Anion gap 7 5 - 15  CBC     Status: Abnormal   Collection Time: 12/05/16  3:52 AM  Result Value Ref Range   WBC 7.1 4.0 - 10.5 K/uL   RBC 3.74 (L) 4.22 - 5.81 MIL/uL   Hemoglobin 9.3 (L)  13.0 - 17.0 g/dL   HCT 31.2 (L) 39.0 - 52.0 %   MCV 83.4 78.0 - 100.0 fL   MCH 24.9 (L) 26.0 - 34.0 pg   MCHC 29.8 (L) 30.0 - 36.0 g/dL   RDW 15.9 (H) 11.5 - 15.5 %   Platelets 195 150 - 400 K/uL  Protime-INR     Status: Abnormal   Collection Time: 12/05/16  3:52 AM  Result Value Ref Range   Prothrombin Time 20.4 (H) 11.4 - 15.2 seconds   INR 1.73   .Cooxemetry Panel (carboxy, met, total hgb, O2 sat)     Status: Abnormal   Collection Time: 12/05/16  4:20 AM  Result Value Ref Range   Total hemoglobin 9.6 (L) 12.0 - 16.0 g/dL   O2 Saturation 53.1 %   Carboxyhemoglobin 1.3 0.5 - 1.5 %   Methemoglobin 1.2 0.0 - 1.5 %  Glucose, capillary     Status: Abnormal   Collection Time: 12/05/16  7:54 AM  Result Value Ref Range   Glucose-Capillary 142 (H) 65 - 99 mg/dL  Glucose, capillary     Status: Abnormal   Collection Time: 12/05/16 11:43 AM  Result Value Ref Range   Glucose-Capillary 134 (H) 65 - 99 mg/dL  Heparin level (unfractionated)     Status: None   Collection Time: 12/05/16  2:16 PM  Result Value Ref Range   Heparin Unfractionated 0.41 0.30 - 0.70 IU/mL    Comment:        IF HEPARIN RESULTS ARE BELOW EXPECTED VALUES, AND PATIENT DOSAGE HAS BEEN CONFIRMED, SUGGEST FOLLOW UP TESTING OF ANTITHROMBIN III LEVELS.   Marland KitchenCooxemetry Panel (carboxy, met, total hgb, O2 sat)     Status: Abnormal   Collection Time: 12/05/16  3:05 PM  Result Value Ref Range   Total hemoglobin 10.2 (L) 12.0 - 16.0 g/dL   O2 Saturation  44.2 %   Carboxyhemoglobin 1.5 0.5 - 1.5 %   Methemoglobin 0.8 0.0 - 1.5 %  Carboxyhemoglobin (single result)     Status: None   Collection Time: 12/05/16  3:50 PM  Result Value Ref Range   Carboxyhemoglobin 1.2 0.5 - 1.5 %  Glucose, capillary     Status: Abnormal   Collection Time: 12/05/16  3:54 PM  Result Value Ref Range   Glucose-Capillary 188 (H) 65 - 99 mg/dL  Cooxemetry Panel (carboxy, met, total hgb, O2 sat)     Status: Abnormal   Collection Time: 12/05/16  8:45 PM  Result Value Ref Range   Total hemoglobin 9.9 (L) 12.0 - 16.0 g/dL   O2 Saturation 54.1 %   Carboxyhemoglobin 1.2 0.5 - 1.5 %   Methemoglobin 1.3 0.0 - 1.5 %  Glucose, capillary     Status: Abnormal   Collection Time: 12/05/16  9:02 PM  Result Value Ref Range   Glucose-Capillary 221 (H) 65 - 99 mg/dL  Lactate dehydrogenase     Status: None   Collection Time: 12/06/16  3:41 AM  Result Value Ref Range   LDH 166 98 - 192 U/L  Heparin level (unfractionated)     Status: None   Collection Time: 12/06/16  3:41 AM  Result Value Ref Range   Heparin Unfractionated 0.44 0.30 - 0.70 IU/mL    Comment:        IF HEPARIN RESULTS ARE BELOW EXPECTED VALUES, AND PATIENT DOSAGE HAS BEEN CONFIRMED, SUGGEST FOLLOW UP TESTING OF ANTITHROMBIN III LEVELS.   Basic metabolic panel     Status: Abnormal   Collection  Time: 12/06/16  3:41 AM  Result Value Ref Range   Sodium 134 (L) 135 - 145 mmol/L   Potassium 4.1 3.5 - 5.1 mmol/L   Chloride 93 (L) 101 - 111 mmol/L   CO2 36 (H) 22 - 32 mmol/L   Glucose, Bld 182 (H) 65 - 99 mg/dL   BUN 19 6 - 20 mg/dL   Creatinine, Ser 1.17 0.61 - 1.24 mg/dL   Calcium 8.9 8.9 - 10.3 mg/dL   GFR calc non Af Amer >60 >60 mL/min   GFR calc Af Amer >60 >60 mL/min    Comment: (NOTE) The eGFR has been calculated using the CKD EPI equation. This calculation has not been validated in all clinical situations. eGFR's persistently <60 mL/min signify possible Chronic Kidney Disease.    Anion gap 5 5  - 15  CBC     Status: Abnormal   Collection Time: 12/06/16  3:41 AM  Result Value Ref Range   WBC 6.6 4.0 - 10.5 K/uL   RBC 3.75 (L) 4.22 - 5.81 MIL/uL   Hemoglobin 9.7 (L) 13.0 - 17.0 g/dL   HCT 31.1 (L) 39.0 - 52.0 %   MCV 82.9 78.0 - 100.0 fL   MCH 25.9 (L) 26.0 - 34.0 pg   MCHC 31.2 30.0 - 36.0 g/dL   RDW 16.2 (H) 11.5 - 15.5 %   Platelets 187 150 - 400 K/uL  Protime-INR     Status: Abnormal   Collection Time: 12/06/16  3:41 AM  Result Value Ref Range   Prothrombin Time 21.3 (H) 11.4 - 15.2 seconds   INR 1.82   Cooxemetry Panel (carboxy, met, total hgb, O2 sat)     Status: Abnormal   Collection Time: 12/06/16  4:10 AM  Result Value Ref Range   Total hemoglobin 12.3 12.0 - 16.0 g/dL   O2 Saturation 59.1 %   Carboxyhemoglobin 1.7 (H) 0.5 - 1.5 %   Methemoglobin 0.7 0.0 - 1.5 %  Glucose, capillary     Status: Abnormal   Collection Time: 12/06/16  7:47 AM  Result Value Ref Range   Glucose-Capillary 144 (H) 65 - 99 mg/dL  Glucose, capillary     Status: Abnormal   Collection Time: 12/06/16 11:41 AM  Result Value Ref Range   Glucose-Capillary 174 (H) 65 - 99 mg/dL  Drug Screen 10 W/Conf, Serum     Status: None   Collection Time: 12/08/16  3:42 PM  Result Value Ref Range   Amphetamines, IA Negative Cutoff:50 ng/mL   Barbiturates, IA Negative Cutoff:0.1 ug/mL   Benzodiazepines, IA Negative Cutoff:20 ng/mL   Cocaine & Metabolite, IA Negative Cutoff:25 ng/mL   Phencyclidine, IA Negative Cutoff:8 ng/mL   THC(Marijuana) Metabolite, IA Negative Cutoff:5 ng/mL   Opiates, IA Negative Cutoff:5 ng/mL   Oxycodones, IA Negative Cutoff:5 ng/mL   Methadone, IA Negative Cutoff:25 ng/mL   Propoxyphene, IA Negative Cutoff:50 ng/mL    Comment: (NOTE) This test was developed and its performance characteristics determined by [facility].  It has not been cleared or approved by the Food and Drug Administration. Performed At: [facility] [place], [place]  [id number] Rondell Reams MD BZ:[id number]   Protime-INR     Status: Abnormal   Collection Time: 12/08/16  3:42 PM  Result Value Ref Range   Prothrombin Time 16.8 (H) 11.4 - 15.2 seconds   INR 1.35   Nicotine/cotinine metabolites     Status: None   Collection Time: 12/08/16  3:42  PM  Result Value Ref Range   Nicotine None Detected ng/mL    Comment: (NOTE) Nicotine levels greater than 2.0 are consistent with the use of tobacco or tobacco cessation products.    Cotinine None Detected ng/mL    Comment: (NOTE) Cotinine levels greater than 20.0 are consistent with the use of tobacco or tobacco cessation products. Performed At: [facility] [place], [place] [id number] Lindon Romp MD [id number]   .Cooxemetry Panel (carboxy, met, total hgb, O2 sat)     Status: Abnormal   Collection Time: 12/08/16  4:05 PM  Result Value Ref Range   Total hemoglobin 10.6 (L) 12.0 - 16.0 g/dL   O2 Saturation 55.2 %   Carboxyhemoglobin 1.2 0.5 - 1.5 %   Methemoglobin 1.1 0.0 - 1.5 %  Basic metabolic panel     Status: Abnormal   Collection Time: 12/14/16 12:02 PM  Result Value Ref Range   Sodium 134 (L) 135 - 145 mmol/L   Potassium 4.0 3.5 - 5.1 mmol/L   Chloride 93 (L) 101 - 111 mmol/L   CO2 32 22 - 32 mmol/L   Glucose, Bld 131 (H) 65 - 99 mg/dL   BUN 21 (H) 6 - 20 mg/dL   Creatinine, Ser 1.09 0.61 - 1.24 mg/dL   Calcium 8.6 (L) 8.9 - 10.3 mg/dL   GFR calc non Af Amer >60 >60 mL/min   GFR calc Af Amer >60 >60 mL/min    Comment: (NOTE) The eGFR has been calculated using the CKD EPI equation. This calculation has not been validated in all clinical situations. eGFR's persistently <60 mL/min signify possible Chronic Kidney Disease.    Anion gap 9 5 - 15  CBC     Status: Abnormal   Collection Time: 12/14/16 12:02 PM  Result Value Ref Range   WBC 7.8 4.0 - 10.5 K/uL   RBC 3.84 (L) 4.22 - 5.81 MIL/uL   Hemoglobin 9.7 (L) 13.0 - 17.0 g/dL   HCT 32.0 (L) 39.0 - 52.0 %    MCV 83.3 78.0 - 100.0 fL   MCH 25.3 (L) 26.0 - 34.0 pg   MCHC 30.3 30.0 - 36.0 g/dL   RDW 16.7 (H) 11.5 - 15.5 %   Platelets 205 150 - 400 K/uL  Protime-INR     Status: Abnormal   Collection Time: 12/14/16 12:02 PM  Result Value Ref Range   Prothrombin Time 19.8 (H) 11.4 - 15.2 seconds   INR 1.66   Lactate dehydrogenase     Status: Abnormal   Collection Time: 12/14/16 12:02 PM  Result Value Ref Range   LDH 207 (H) 98 - 192 U/L  .Cooxemetry Panel (carboxy, met, total hgb, O2 sat)     Status: Abnormal   Collection Time: 12/14/16 12:05 PM  Result Value Ref Range   Total hemoglobin 9.9 (L) 12.0 - 16.0 g/dL   O2 Saturation 46.8 %   Carboxyhemoglobin 0.9 0.5 - 1.5 %   Methemoglobin 1.2 0.0 - 1.5 %  Protime-INR     Status: Abnormal   Collection Time: 12/22/16 12:58 PM  Result Value Ref Range   Prothrombin Time 21.2 (H) 11.4 - 15.2 seconds   INR 1.81   Cooxemetry Panel (carboxy, met, total hgb, O2 sat)     Status: Abnormal   Collection Time: 12/22/16  1:00 PM  Result Value Ref Range   Total hemoglobin 10.0 (L) 12.0 - 16.0 g/dL   O2 Saturation 48.5 %   Carboxyhemoglobin 1.5 0.5 -  1.5 %   Methemoglobin 0.7 0.0 - 1.5 %  Basic metabolic panel     Status: Abnormal   Collection Time: 12/29/16 11:24 AM  Result Value Ref Range   Sodium 127 (L) 135 - 145 mmol/L   Potassium 3.2 (L) 3.5 - 5.1 mmol/L   Chloride 81 (L) 101 - 111 mmol/L   CO2 32 22 - 32 mmol/L   Glucose, Bld 181 (H) 65 - 99 mg/dL   BUN 85 (H) 6 - 20 mg/dL   Creatinine, Ser 1.60 (H) 0.61 - 1.24 mg/dL   Calcium 8.9 8.9 - 10.3 mg/dL   GFR calc non Af Amer 45 (L) >60 mL/min   GFR calc Af Amer 52 (L) >60 mL/min    Comment: (NOTE) The eGFR has been calculated using the CKD EPI equation. This calculation has not been validated in all clinical situations. eGFR's persistently <60 mL/min signify possible Chronic Kidney Disease.    Anion gap 14 5 - 15  Carboxyhemoglobin (single result)     Status: None   Collection Time:  12/29/16 11:24 AM  Result Value Ref Range   Carboxyhemoglobin 1.0 0.5 - 1.5 %  CBC     Status: Abnormal   Collection Time: 12/29/16 11:24 AM  Result Value Ref Range   WBC 7.7 4.0 - 10.5 K/uL   RBC 4.13 (L) 4.22 - 5.81 MIL/uL   Hemoglobin 10.0 (L) 13.0 - 17.0 g/dL   HCT 32.0 (L) 39.0 - 52.0 %   MCV 77.5 (L) 78.0 - 100.0 fL   MCH 24.2 (L) 26.0 - 34.0 pg   MCHC 31.3 30.0 - 36.0 g/dL   RDW 16.8 (H) 11.5 - 15.5 %   Platelets 226 150 - 400 K/uL  Protime-INR     Status: Abnormal   Collection Time: 12/29/16 11:24 AM  Result Value Ref Range   Prothrombin Time 21.0 (H) 11.4 - 15.2 seconds   INR 1.78   Lactate dehydrogenase     Status: Abnormal   Collection Time: 12/29/16 11:24 AM  Result Value Ref Range   LDH 233 (H) 98 - 192 U/L  .Cooxemetry Panel (carboxy, met, total hgb, O2 sat)     Status: Abnormal   Collection Time: 01/03/17  3:10 PM  Result Value Ref Range   Total hemoglobin 9.4 (L) 12.0 - 16.0 g/dL   O2 Saturation 63.7 %   Carboxyhemoglobin 1.3 0.5 - 1.5 %   Methemoglobin 1.4 0.0 - 1.5 %  Basic metabolic panel     Status: Abnormal   Collection Time: 01/05/17 11:12 AM  Result Value Ref Range   Sodium 130 (L) 135 - 145 mmol/L   Potassium 5.0 3.5 - 5.1 mmol/L   Chloride 92 (L) 101 - 111 mmol/L   CO2 29 22 - 32 mmol/L   Glucose, Bld 177 (H) 65 - 99 mg/dL   BUN 26 (H) 6 - 20 mg/dL   Creatinine, Ser 1.27 (H) 0.61 - 1.24 mg/dL   Calcium 8.6 (L) 8.9 - 10.3 mg/dL   GFR calc non Af Amer 60 (L) >60 mL/min   GFR calc Af Amer >60 >60 mL/min    Comment: (NOTE) The eGFR has been calculated using the CKD EPI equation. This calculation has not been validated in all clinical situations. eGFR's persistently <60 mL/min signify possible Chronic Kidney Disease.    Anion gap 9 5 - 15  CBC     Status: Abnormal   Collection Time: 01/05/17 11:12 AM  Result Value Ref Range  WBC 8.0 4.0 - 10.5 K/uL   RBC 3.88 (L) 4.22 - 5.81 MIL/uL   Hemoglobin 9.4 (L) 13.0 - 17.0 g/dL   HCT 31.0 (L) 39.0  - 52.0 %   MCV 79.9 78.0 - 100.0 fL   MCH 24.2 (L) 26.0 - 34.0 pg   MCHC 30.3 30.0 - 36.0 g/dL   RDW 17.6 (H) 11.5 - 15.5 %   Platelets 177 150 - 400 K/uL  Protime-INR     Status: Abnormal   Collection Time: 01/05/17 11:12 AM  Result Value Ref Range   Prothrombin Time 21.9 (H) 11.4 - 15.2 seconds   INR 1.88   Lactate dehydrogenase     Status: Abnormal   Collection Time: 01/05/17 11:12 AM  Result Value Ref Range   LDH 237 (H) 98 - 192 U/L  .Cooxemetry Panel (carboxy, met, total hgb, O2 sat)     Status: Abnormal   Collection Time: 01/05/17 11:20 AM  Result Value Ref Range   Total hemoglobin 9.6 (L) 12.0 - 16.0 g/dL   O2 Saturation 75.3 %   Carboxyhemoglobin 1.3 0.5 - 1.5 %   Methemoglobin 1.3 0.0 - 1.5 %  Protime-INR     Status: Abnormal   Collection Time: 01/11/17 11:59 AM  Result Value Ref Range   Prothrombin Time 31.0 (H) 11.4 - 15.2 seconds   INR 2.91      Past Medical History:  Diagnosis Date  . AICD (automatic cardioverter/defibrillator) present   . ASCVD (arteriosclerotic cardiovascular disease)   . Chronic systolic CHF (congestive heart failure) ([place])   . COPD (chronic obstructive pulmonary disease) ([place])   . Coronary artery disease   . Depression   . Diabetes mellitus   . History of cocaine abuse   . Hypertension   . Presence of permanent cardiac pacemaker   . Shortness of breath dyspnea   . Suicidal ideation   . Tobacco abuse     Current Outpatient Prescriptions  Medication Sig Dispense Refill  . amiodarone (PACERONE) 200 MG tablet Take 1 tablet (200 mg total) by mouth daily. 60 tablet 6  . aspirin EC 81 MG EC tablet Take 1 tablet (81 mg total) by mouth daily. 30 tablet 6  . citalopram (CELEXA) 10 MG tablet Take 1 tablet (10 mg total) by mouth daily. 30 tablet 6  . digoxin (LANOXIN) 0.125 MG tablet Take 125 mcg by mouth daily.    Marland Kitchen docusate sodium (COLACE) 100 MG capsule Take 2 capsules (200 mg total) by mouth daily. 10 capsule 0  . gabapentin (NEURONTIN)  100 MG capsule Take 2 capsules (200 mg total) by mouth 2 (two) times daily. 60 capsule 6  . Insulin Glargine (LANTUS SOLOSTAR) 100 UNIT/ML Solostar Pen Inject 20 Units into the skin daily at 10 pm.    . losartan (COZAAR) 25 MG tablet Take 1 tablet (25 mg total) by mouth daily. 30 tablet 6  . magnesium oxide (MAG-OX) 400 (241.3 Mg) MG tablet Take 1 tablet (400 mg total) by mouth daily. 30 tablet 6  . metolazone (ZAROXOLYN) 2.5 MG tablet Take 2.5 mg by mouth as directed. By [facility]    . milrinone (PRIMACOR) 20 MG/100 ML SOLN infusion Inject 25.175 mcg/min into the vein continuous. Per [facility] pharmacy 100 mL 6  . pantoprazole (PROTONIX) 40 MG tablet Take 1 tablet (40 mg total) by mouth daily. 30 tablet 6  . potassium chloride SA (K-DUR,KLOR-CON) 20 MEQ tablet Take 20 mEq by mouth 2 (two) times daily. Take additional  20 mEq tablet on metolazone days    . sildenafil (REVATIO) 20 MG tablet Take 1 tablet (20 mg total) by mouth 3 (three) times daily. 90 tablet 2  . spironolactone (ALDACTONE) 25 MG tablet Take 1 tablet (25 mg total) by mouth daily. 30 tablet 6  . torsemide (DEMADEX) 20 MG tablet Take 2 tablets (40 mg total) by mouth 2 (two) times daily. 60 tablet 12  . warfarin (COUMADIN) 5 MG tablet Take 2-3 tablets (10-15 mg total) by mouth daily. As directed based on blood checks 90 tablet 6  . insulin aspart (NOVOLOG FLEXPEN) 100 UNIT/ML FlexPen Inject 10 Units into the skin 3 (three) times daily with meals. 15 mL 11   No current facility-administered medications for this encounter.     Codeine; Trazodone and nefazodone; Lipitor [atorvastatin]; and Tape  REVIEW OF SYSTEMS: All systems negative except as listed in HPI, PMH and Problem list.  Vital Signs:   Personally reviewed (DB) Doppler Pressure:100  Weight today: 238 pounds  Last Weight: 236  Pounds.    Wt Readings from Last 3 Encounters:  01/11/17 238 lb (108 kg)  12/29/16 236 lb 12.8 oz (107.4 kg)  12/22/16 230 lb (104.3  kg)     Physical Exam: GENERAL: Well appearing, male who presents to clinic today in no acute distress. Walked in the clinic without difficulty HEENT: normal  NECK: Supple, JVP 6-7  .Carotids  2+ bilaterally, no bruits.  No lymphadenopathy or thyromegaly appreciated.   CARDIAC:  Mechanical heart sounds with LVAD hum present.  LUNGS:  CTAB  ABDOMEN:  Obese, soft, round, nontender, positive bowel sounds x4.     LVAD exit site: well-healed and incorporated.  Dressing dry and intact.  No erythema or drainage.  Stabilization device present and accurately applied.  Driveline dressing is being changed daily per sterile technique. EXTREMITIES:  Warm and dry, no cyanosis, clubbing, rash or edema . RUE PICC nontender. No erythema.    1. Chronic end-stage biventricular systolic HF: LVEF 22% due to ICM -> s/p Echo 08/24/16 LVEF 15%, RV mild dilated, moderately reduced. --> S/P HM3 LVAD 48/11/5001.   -  Complicated by ongoing RV failure -  Funcitonall improvement. NYHA II-III despite 2 pound weight gain. Likely due to increased milrinone.  Continue milrinone 0.375 mcg.  - Volume status elevated. Increase torsemide to 60 mg in am and continue 40 mg in pm. Hold off on IV lasix.  - Continue HH for diuretic protocol and milrinone.  -  Have discussed with [facility] transplant team (Dr. Hoyt Koch). They have re-initiated transplant w/u  - VAD parameters stable  - Warfarin with goal INR 2.0 - 2.5 + ASA 81 daily. Avoid lovenox in future due to epistaxis.  INR today 2.9 . See anticoagulation flow sheet.  - LDH 233>237.   2. CAD: No CP . 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  Continue current regimen.   3. DMII:   - Hgb A1C 8.1 on 09/09/2016. I have asked him to follow up with PCP.  4. Tobacco/substance abuse Stopped smoking. Check cotinine next visit.  - 5. Atrial fibrillation, paroxysmal Regular pulse. Continue current dose of amio. Will not decrease until we wean milrinone.  6.  Left pleural effusion - underwent repeat thoracentesis in Jan. 2018.  -  Stable L small pleural effusion on 12/01/16 with mild bibasilar atelectasis. 7. Anxiety/Depression - Stable . Continue celexa and trazadone.   Looks much better today with functional improvement. Follow  up next week to reassess volume. Main issue seems to be high salt and excessive fluid intake.    Darrick Grinder, NP  01/11/2017

## 2017-01-12 ENCOUNTER — Telehealth (HOSPITAL_COMMUNITY): Payer: Self-pay | Admitting: *Deleted

## 2017-01-12 ENCOUNTER — Encounter (HOSPITAL_COMMUNITY): Payer: Medicare HMO

## 2017-01-12 DIAGNOSIS — I5022 Chronic systolic (congestive) heart failure: Secondary | ICD-10-CM

## 2017-01-12 MED ORDER — AMIODARONE HCL 200 MG PO TABS: 200.0000 mg | ORAL_TABLET | Freq: Every day | ORAL | 6 refills | Status: DC

## 2017-01-12 MED ORDER — CITALOPRAM HYDROBROMIDE 10 MG PO TABS: 10.0000 mg | ORAL_TABLET | Freq: Every day | ORAL | 6 refills | Status: DC

## 2017-01-12 MED ORDER — DIGOXIN 125 MCG PO TABS
125.0000 ug | ORAL_TABLET | Freq: Every day | ORAL | 3 refills | Status: DC
Start: 2017-01-12 — End: 2017-02-21

## 2017-01-12 MED ORDER — GABAPENTIN 100 MG PO CAPS: 200.0000 mg | ORAL_CAPSULE | Freq: Two times a day (BID) | ORAL | 3 refills | Status: DC

## 2017-01-12 NOTE — Telephone Encounter (Signed)
Paged requesting refills for amio, celexa, gabapentin, digoxin. Sent to pharmacy.  Balinda Quails RN, VAD Coordinator 24/7 pager [phone]

## 2017-01-13 ENCOUNTER — Ambulatory Visit (HOSPITAL_COMMUNITY)
Admission: RE | Admit: 2017-01-13 | Discharge: 2017-01-13 | Disposition: A | Discharge status: Home / Self Care | Payer: Medicare HMO | Source: Ambulatory Visit | Attending: Cardiology | Admitting: Cardiology

## 2017-01-13 VITALS — BP 116/75 | HR 110 | Ht 69.0 in | Wt 246.0 lb

## 2017-01-13 DIAGNOSIS — I5040 Unspecified combined systolic (congestive) and diastolic (congestive) heart failure: Secondary | ICD-10-CM | POA: Diagnosis not present

## 2017-01-13 DIAGNOSIS — I2510 Atherosclerotic heart disease of native coronary artery without angina pectoris: Secondary | ICD-10-CM | POA: Diagnosis not present

## 2017-01-13 DIAGNOSIS — I5022 Chronic systolic (congestive) heart failure: Secondary | ICD-10-CM | POA: Diagnosis not present

## 2017-01-13 DIAGNOSIS — I5023 Acute on chronic systolic (congestive) heart failure: Secondary | ICD-10-CM

## 2017-01-13 DIAGNOSIS — I509 Heart failure, unspecified: Secondary | ICD-10-CM | POA: Diagnosis not present

## 2017-01-13 DIAGNOSIS — Z720 Tobacco use: Secondary | ICD-10-CM | POA: Insufficient documentation

## 2017-01-13 DIAGNOSIS — I110 Hypertensive heart disease with heart failure: Secondary | ICD-10-CM | POA: Diagnosis not present

## 2017-01-13 DIAGNOSIS — I480 Paroxysmal atrial fibrillation: Secondary | ICD-10-CM | POA: Diagnosis not present

## 2017-01-13 DIAGNOSIS — I50810 Right heart failure, unspecified: Secondary | ICD-10-CM

## 2017-01-13 DIAGNOSIS — Z7901 Long term (current) use of anticoagulants: Secondary | ICD-10-CM

## 2017-01-13 DIAGNOSIS — Z95811 Presence of heart assist device: Secondary | ICD-10-CM | POA: Insufficient documentation

## 2017-01-13 DIAGNOSIS — Z951 Presence of aortocoronary bypass graft: Secondary | ICD-10-CM | POA: Insufficient documentation

## 2017-01-13 DIAGNOSIS — I159 Secondary hypertension, unspecified: Secondary | ICD-10-CM

## 2017-01-13 MED ORDER — POTASSIUM CHLORIDE CRYS ER 20 MEQ PO TBCR
20.0000 meq | EXTENDED_RELEASE_TABLET | Freq: Once | ORAL | Status: AC
  Administered 2017-01-13: 20 meq via ORAL
  Filled 2017-01-13: qty 1

## 2017-01-13 MED ORDER — FUROSEMIDE 10 MG/ML IJ SOLN
80.0000 mg | Freq: Once | INTRAMUSCULAR | Status: AC
  Administered 2017-01-13: 80 mg via INTRAVENOUS
  Filled 2017-01-13: qty 8

## 2017-01-13 NOTE — Patient Instructions (Signed)
1.Return to clinic next Wednesday appt already scheduled. 2. Start taking Torsemide 60 mg in the morning and 60 mg in the evening 3. Re-educated about heart failure diet 4. 80 mg of Lasix given in clinic today along with 20 mEq of Potassium. 5. Please take 2.5 mg of Metalozone on Saturday.  6. Referral sent to begin PCP care in [place]. 7. Will referral pt for sleep study.

## 2017-01-13 NOTE — Progress Notes (Signed)
Advanced Heart Failure Clinic Note   Referring Provider: Darylene Price, FNP Primary Care: [facility] Primary Cardiologist: Dr Haroldine Laws.   HPI: Ronald Miller. is a 61 y.o. male with history of chronic systolic HF s/p [employer] ICD 2014, CAD s/p CABG in 2000, HTN, Hx of cocaine abuse, Tobacco abuse, Depression, and COPD. Had thoracentesis 10/2016.   Underwent HM 3 placement 09/09/16. RV failure. Discharged on milrinone 0.125 mcg. Later weaned off.   Hospitalized from 11/30/16-12/06/16 with volume overload, DOE and orthopnea. Weight was up 15 pounds, mixed venous sat was 50% consistent  low output. He was diuresed with IV Lasix then transitioned to torsemide 18m daily. Had a ramp echo on 12/01/16, and speed increased to 5900 -> 6000. The following morning his CO-OX sat was down into 40s with frequent PI events so speed turned back down to 5900 and diuresis continued. Milrinone restarted. He was discharged on 0.1293m of Milrinone. Discharge weight was 224 pounds.   Today he returns to the LVAD clinic for an acute visit due to increased dyspnea. He was seen earlier this week and was able to walk without difficulty or dyspnea. Today he is complaining of abdominal bloating and dyspnea with exertion. Having difficulty sleeping. Weight at home 234-238 pounds. Missed cardiac rehab. Says he is trying to limit fluid intake and eat low salt diet. No fever or chills.  No BRBPR. No hematuria or coke colored urine.e presentes for LVAD follow up. He remains on milrinone 0.375 mcg . No fever or chills. [facility].     VAD Indication: HM[facility]   VAD interrogation & Equipment Management:  Personally reviewed by AmDarrick Grinder  Speed:5900 Flow: 5.7  Power: 4.8  PI: 2.7 Alarms: no clinical alarms Events: On 4/11 20 PI events.   Fixed speed: 5900 Low speed limit:  5600  Primary Controller: Replace back up battery in [id number] up controller: Replace back up battery in [id number] Results (from the past 2160 hour(s))  Lactate Dehydrogenase (LDH)     Status: None   Collection Time: 10/26/16  1:06 PM  Result Value Ref Range   LDH 181 98 - 192 U/L  Basic metabolic panel     Status: Abnormal   Collection Time: 10/26/16  1:30 PM  Result Value Ref Range   Sodium 136 135 - 145 mmol/L   Potassium 3.8 3.5 - 5.1 mmol/L   Chloride 97 (L) 101 - 111 mmol/L   CO2 32 22 - 32 mmol/L   Glucose, Bld 209 (H) 65 - 99 mg/dL   BUN 17 6 - 20 mg/dL   Creatinine, Ser 0.94 0.61 - 1.24 mg/dL   Calcium 8.8 (L) 8.9 - 10.3 mg/dL   GFR calc non Af Amer >60 >60 mL/min   GFR calc Af Amer >60 >60 mL/min    Comment: (NOTE) The eGFR has been calculated using the CKD EPI equation. This calculation has not been validated in all clinical situations. eGFR's persistently <60 mL/min signify possible Chronic Kidney Disease.    Anion gap 7 5 - 15  Protime-INR     Status: Abnormal   Collection Time: 10/26/16  1:30 PM  Result Value Ref Range   Prothrombin Time 16.4 (H) 11.4 - 15.2 seconds   INR 1.31   CBC     Status: Abnormal   Collection Time: 10/26/16  1:30 PM  Result Value Ref Range   WBC 9.4 4.0 - 10.5 K/uL   RBC 4.00 (L) 4.22 - 5.81 MIL/uL   Hemoglobin 11.0 (L) 13.0 - 17.0 g/dL   HCT 35.3 (L) 39.0 - 52.0 %   MCV 88.3 78.0 - 100.0 fL   MCH 27.5 26.0 - 34.0 pg   MCHC 31.2 30.0 - 36.0 g/dL   RDW 15.8 (H) 11.5 - 15.5 %   Platelets 163 150 - 400 K/uL  INR/PT     Status: Abnormal   Collection Time: 10/28/16 10:32 AM  Result Value Ref Range   Prothrombin Time 18.5 (H) 11.4 - 15.2 seconds   INR 1.52   CBC     Status: Abnormal   Collection Time: 10/28/16 10:32 AM  Result Value Ref Range   WBC 9.3 4.0 - 10.5 K/uL   RBC 4.17 (L) 4.22 - 5.81 MIL/uL   Hemoglobin 11.6 (L) 13.0 - 17.0 g/dL   HCT 37.1 (L) 39.0 - 52.0 %   MCV 89.0 78.0 - 100.0 fL   MCH 27.8  26.0 - 34.0 pg   MCHC 31.3 30.0 - 36.0 g/dL   RDW 16.2 (H) 11.5 - 15.5 %   Platelets 153 150 - 400 K/uL  INR/PT     Status: Abnormal   Collection Time: 11/02/16 10:08 AM  Result Value Ref Range   Prothrombin Time 19.9 (H) 11.4 - 15.2 seconds   INR 1.67   CBC     Status: Abnormal   Collection Time: 11/10/16 11:10 AM  Result Value Ref Range   WBC 6.5 4.0 - 10.5 K/uL   RBC 3.71 (L) 4.22 - 5.81 MIL/uL   Hemoglobin 9.8 (L) 13.0 - 17.0 g/dL   HCT 32.5 (L) 39.0 - 52.0 %   MCV 87.6 78.0 - 100.0 fL   MCH 26.4 26.0 - 34.0 pg   MCHC 30.2 30.0 - 36.0 g/dL   RDW 15.6 (H) 11.5 - 15.5 %   Platelets 190 150 - 400 K/uL  Basic metabolic panel     Status: Abnormal   Collection Time: 11/10/16 11:10 AM  Result Value Ref Range   Sodium 138 135 - 145 mmol/L   Potassium 3.5 3.5 - 5.1 mmol/L   Chloride 95 (L) 101 - 111 mmol/L   CO2 33 (H) 22 - 32 mmol/L   Glucose, Bld 184 (H) 65 - 99 mg/dL   BUN 12 6 - 20 mg/dL   Creatinine, Ser 0.90 0.61 - 1.24 mg/dL   Calcium 8.7 (L) 8.9 - 10.3 mg/dL   GFR calc non Af Amer >60 >60 mL/min   GFR calc Af Amer >60 >60 mL/min    Comment: (NOTE) The eGFR has been calculated using the CKD EPI equation. This calculation has not been validated in all clinical situations. eGFR's persistently <60 mL/min signify possible Chronic Kidney Disease.    Anion gap 10 5 - 15  Protime-INR     Status: Abnormal   Collection Time: 11/10/16 11:10 AM  Result Value Ref Range   Prothrombin Time 22.8 (H) 11.4 - 15.2 seconds   INR 1.98   Lactate dehydrogenase     Status: None   Collection Time: 11/10/16 11:10 AM  Result Value Ref Range   LDH 171 98 - 192 U/L  Protime-INR     Status: Abnormal   Collection Time: 11/17/16 11:03 AM  Result Value Ref Range   Prothrombin Time 31.9 (H) 11.4 - 15.2 seconds   INR 3.01   Rapid urine drug  screen (hospital performed)     Status: None   Collection Time: 11/17/16 12:51 PM  Result Value Ref Range   Opiates NONE DETECTED NONE DETECTED    Cocaine NONE DETECTED NONE DETECTED   Benzodiazepines NONE DETECTED NONE DETECTED   Amphetamines NONE DETECTED NONE DETECTED   Tetrahydrocannabinol NONE DETECTED NONE DETECTED   Barbiturates NONE DETECTED NONE DETECTED    Comment:        DRUG SCREEN FOR MEDICAL PURPOSES ONLY.  IF CONFIRMATION IS NEEDED FOR ANY PURPOSE, NOTIFY LAB WITHIN 5 DAYS.        LOWEST DETECTABLE LIMITS FOR URINE DRUG SCREEN Drug Class       Cutoff (ng/mL) Amphetamine      1000 Barbiturate      200 Benzodiazepine   007 Tricyclics       622 Opiates          300 Cocaine          300 THC              50   Glucose, capillary     Status: Abnormal   Collection Time: 11/23/16  8:22 AM  Result Value Ref Range   Glucose-Capillary 160 (H) 65 - 99 mg/dL  Glucose, capillary     Status: Abnormal   Collection Time: 11/23/16 10:10 AM  Result Value Ref Range   Glucose-Capillary 159 (H) 65 - 99 mg/dL  Glucose, capillary     Status: Abnormal   Collection Time: 11/25/16  8:48 AM  Result Value Ref Range   Glucose-Capillary 170 (H) 65 - 99 mg/dL  Glucose, capillary     Status: Abnormal   Collection Time: 11/25/16  9:52 AM  Result Value Ref Range   Glucose-Capillary 189 (H) 65 - 99 mg/dL  INR/PT     Status: Abnormal   Collection Time: 11/25/16 11:16 AM  Result Value Ref Range   Prothrombin Time 16.0 (H) 11.4 - 15.2 seconds   INR 1.28   INR/PT     Status: Abnormal   Collection Time: 11/30/16 10:44 AM  Result Value Ref Range   Prothrombin Time 15.6 (H) 11.4 - 15.2 seconds   INR 1.23   Lactate Dehydrogenase (LDH)     Status: Abnormal   Collection Time: 11/30/16 10:44 AM  Result Value Ref Range   LDH 198 (H) 98 - 192 U/L  CBC     Status: Abnormal   Collection Time: 11/30/16 11:58 AM  Result Value Ref Range   WBC 7.4 4.0 - 10.5 K/uL   RBC 3.61 (L) 4.22 - 5.81 MIL/uL   Hemoglobin 9.1 (L) 13.0 - 17.0 g/dL   HCT 30.9 (L) 39.0 - 52.0 %   MCV 85.6 78.0 - 100.0 fL   MCH 25.2 (L) 26.0 - 34.0 pg   MCHC 29.4 (L) 30.0 -  36.0 g/dL   RDW 15.8 (H) 11.5 - 15.5 %   Platelets 175 150 - 400 K/uL  Comprehensive metabolic panel     Status: Abnormal   Collection Time: 11/30/16 11:58 AM  Result Value Ref Range   Sodium 137 135 - 145 mmol/L   Potassium 3.5 3.5 - 5.1 mmol/L   Chloride 94 (L) 101 - 111 mmol/L   CO2 35 (H) 22 - 32 mmol/L   Glucose, Bld 134 (H) 65 - 99 mg/dL   BUN 15 6 - 20 mg/dL   Creatinine, Ser 1.06 0.61 - 1.24 mg/dL   Calcium 8.6 (L) 8.9 - 10.3 mg/dL   Total  Protein 7.1 6.5 - 8.1 g/dL   Albumin 3.4 (L) 3.5 - 5.0 g/dL   AST 23 15 - 41 U/L   ALT 17 17 - 63 U/L   Alkaline Phosphatase 92 38 - 126 U/L   Total Bilirubin 0.4 0.3 - 1.2 mg/dL   GFR calc non Af Amer >60 >60 mL/min   GFR calc Af Amer >60 >60 mL/min    Comment: (NOTE) The eGFR has been calculated using the CKD EPI equation. This calculation has not been validated in all clinical situations. eGFR's persistently <60 mL/min signify possible Chronic Kidney Disease.    Anion gap 8 5 - 15  Brain natriuretic peptide     Status: Abnormal   Collection Time: 11/30/16 11:58 AM  Result Value Ref Range   B Natriuretic Peptide 565.5 (H) 0.0 - 100.0 pg/mL  MRSA PCR Screening     Status: None   Collection Time: 11/30/16  2:10 PM  Result Value Ref Range   MRSA by PCR NEGATIVE NEGATIVE    Comment:        The GeneXpert MRSA Assay (FDA approved for NASAL specimens only), is one component of a comprehensive MRSA colonization surveillance program. It is not intended to diagnose MRSA infection nor to guide or monitor treatment for MRSA infections.   Glucose, capillary     Status: Abnormal   Collection Time: 11/30/16  9:29 PM  Result Value Ref Range   Glucose-Capillary 213 (H) 65 - 99 mg/dL  .Cooxemetry Panel (carboxy, met, total hgb, O2 sat)     Status: Abnormal   Collection Time: 12/01/16  5:00 AM  Result Value Ref Range   Total hemoglobin 8.6 (L) 12.0 - 16.0 g/dL   O2 Saturation 50.0 %   Carboxyhemoglobin 1.2 0.5 - 1.5 %    Methemoglobin 0.6 0.0 - 1.5 %  Protime-INR     Status: Abnormal   Collection Time: 12/01/16  6:36 AM  Result Value Ref Range   Prothrombin Time 15.6 (H) 11.4 - 15.2 seconds   INR 1.23   Lactate dehydrogenase     Status: None   Collection Time: 12/01/16  6:36 AM  Result Value Ref Range   LDH 170 98 - 192 U/L  CBC     Status: Abnormal   Collection Time: 12/01/16  6:36 AM  Result Value Ref Range   WBC 6.4 4.0 - 10.5 K/uL   RBC 3.65 (L) 4.22 - 5.81 MIL/uL   Hemoglobin 9.2 (L) 13.0 - 17.0 g/dL   HCT 30.9 (L) 39.0 - 52.0 %   MCV 84.7 78.0 - 100.0 fL   MCH 25.2 (L) 26.0 - 34.0 pg   MCHC 29.8 (L) 30.0 - 36.0 g/dL   RDW 15.7 (H) 11.5 - 15.5 %   Platelets 178 150 - 400 K/uL  Basic metabolic panel     Status: Abnormal   Collection Time: 12/01/16  6:36 AM  Result Value Ref Range   Sodium 138 135 - 145 mmol/L   Potassium 3.6 3.5 - 5.1 mmol/L   Chloride 92 (L) 101 - 111 mmol/L   CO2 36 (H) 22 - 32 mmol/L   Glucose, Bld 213 (H) 65 - 99 mg/dL   BUN 18 6 - 20 mg/dL   Creatinine, Ser 1.04 0.61 - 1.24 mg/dL   Calcium 8.5 (L) 8.9 - 10.3 mg/dL   GFR calc non Af Amer >60 >60 mL/min   GFR calc Af Amer >60 >60 mL/min    Comment: (NOTE) The eGFR has  been calculated using the CKD EPI equation. This calculation has not been validated in all clinical situations. eGFR's persistently <60 mL/min signify possible Chronic Kidney Disease.    Anion gap 10 5 - 15  Glucose, capillary     Status: Abnormal   Collection Time: 12/01/16  8:53 AM  Result Value Ref Range   Glucose-Capillary 218 (H) 65 - 99 mg/dL  Glucose, capillary     Status: Abnormal   Collection Time: 12/01/16 11:51 AM  Result Value Ref Range   Glucose-Capillary 211 (H) 65 - 99 mg/dL  ECHOCARDIOGRAM LIMITED     Status: None   Collection Time: 12/01/16  1:50 PM  Result Value Ref Range   Weight 3,696 oz   Height 70 in   BP 118/85 mmHg  Glucose, capillary     Status: Abnormal   Collection Time: 12/01/16  5:00 PM  Result Value Ref Range    Glucose-Capillary 160 (H) 65 - 99 mg/dL  Glucose, capillary     Status: Abnormal   Collection Time: 12/01/16  9:12 PM  Result Value Ref Range   Glucose-Capillary 170 (H) 65 - 99 mg/dL  Protime-INR     Status: Abnormal   Collection Time: 12/02/16  4:45 AM  Result Value Ref Range   Prothrombin Time 15.6 (H) 11.4 - 15.2 seconds   INR 1.24   Lactate dehydrogenase     Status: None   Collection Time: 12/02/16  4:45 AM  Result Value Ref Range   LDH 171 98 - 192 U/L  CBC     Status: Abnormal   Collection Time: 12/02/16  4:45 AM  Result Value Ref Range   WBC 7.0 4.0 - 10.5 K/uL   RBC 3.80 (L) 4.22 - 5.81 MIL/uL   Hemoglobin 9.5 (L) 13.0 - 17.0 g/dL   HCT 31.9 (L) 39.0 - 52.0 %   MCV 83.9 78.0 - 100.0 fL   MCH 25.0 (L) 26.0 - 34.0 pg   MCHC 29.8 (L) 30.0 - 36.0 g/dL   RDW 15.9 (H) 11.5 - 15.5 %   Platelets 188 150 - 400 K/uL  Basic metabolic panel     Status: Abnormal   Collection Time: 12/02/16  4:45 AM  Result Value Ref Range   Sodium 137 135 - 145 mmol/L   Potassium 3.8 3.5 - 5.1 mmol/L   Chloride 94 (L) 101 - 111 mmol/L   CO2 36 (H) 22 - 32 mmol/L   Glucose, Bld 131 (H) 65 - 99 mg/dL   BUN 18 6 - 20 mg/dL   Creatinine, Ser 1.05 0.61 - 1.24 mg/dL   Calcium 8.6 (L) 8.9 - 10.3 mg/dL   GFR calc non Af Amer >60 >60 mL/min   GFR calc Af Amer >60 >60 mL/min    Comment: (NOTE) The eGFR has been calculated using the CKD EPI equation. This calculation has not been validated in all clinical situations. eGFR's persistently <60 mL/min signify possible Chronic Kidney Disease.    Anion gap 7 5 - 15  .Cooxemetry Panel (carboxy, met, total hgb, O2 sat)     Status: Abnormal   Collection Time: 12/02/16  5:12 AM  Result Value Ref Range   Total hemoglobin 11.1 (L) 12.0 - 16.0 g/dL   O2 Saturation 38.6 %   Carboxyhemoglobin 1.0 0.5 - 1.5 %   Methemoglobin 1.1 0.0 - 1.5 %  .Cooxemetry Panel (carboxy, met, total hgb, O2 sat)     Status: Abnormal   Collection Time: 12/02/16  7:20 AM  Result Value Ref Range   Total hemoglobin 9.7 (L) 12.0 - 16.0 g/dL   O2 Saturation 62.2 %   Carboxyhemoglobin 1.7 (H) 0.5 - 1.5 %   Methemoglobin 0.9 0.0 - 1.5 %  Glucose, capillary     Status: Abnormal   Collection Time: 12/02/16  8:43 AM  Result Value Ref Range   Glucose-Capillary 119 (H) 65 - 99 mg/dL  Glucose, capillary     Status: Abnormal   Collection Time: 12/02/16 11:32 AM  Result Value Ref Range   Glucose-Capillary 147 (H) 65 - 99 mg/dL  .Cooxemetry Panel (carboxy, met, total hgb, O2 sat)     Status: Abnormal   Collection Time: 12/02/16  2:00 PM  Result Value Ref Range   Total hemoglobin 9.8 (L) 12.0 - 16.0 g/dL   O2 Saturation 43.0 %   Carboxyhemoglobin 1.2 0.5 - 1.5 %   Methemoglobin 1.2 0.0 - 1.5 %  Glucose, capillary     Status: Abnormal   Collection Time: 12/02/16  3:35 PM  Result Value Ref Range   Glucose-Capillary 138 (H) 65 - 99 mg/dL  Glucose, capillary     Status: Abnormal   Collection Time: 12/02/16  9:13 PM  Result Value Ref Range   Glucose-Capillary 220 (H) 65 - 99 mg/dL  Heparin level (unfractionated)     Status: Abnormal   Collection Time: 12/02/16  9:59 PM  Result Value Ref Range   Heparin Unfractionated 0.17 (L) 0.30 - 0.70 IU/mL    Comment:        IF HEPARIN RESULTS ARE BELOW EXPECTED VALUES, AND PATIENT DOSAGE HAS BEEN CONFIRMED, SUGGEST FOLLOW UP TESTING OF ANTITHROMBIN III LEVELS.   Heparin level (unfractionated)     Status: Abnormal   Collection Time: 12/03/16  3:46 AM  Result Value Ref Range   Heparin Unfractionated 0.27 (L) 0.30 - 0.70 IU/mL    Comment:        IF HEPARIN RESULTS ARE BELOW EXPECTED VALUES, AND PATIENT DOSAGE HAS BEEN CONFIRMED, SUGGEST FOLLOW UP TESTING OF ANTITHROMBIN III LEVELS.   Basic metabolic panel     Status: Abnormal   Collection Time: 12/03/16  3:46 AM  Result Value Ref Range   Sodium 133 (L) 135 - 145 mmol/L   Potassium 3.7 3.5 - 5.1 mmol/L   Chloride 92 (L) 101 - 111 mmol/L   CO2 32 22 - 32 mmol/L    Glucose, Bld 150 (H) 65 - 99 mg/dL   BUN 18 6 - 20 mg/dL   Creatinine, Ser 1.01 0.61 - 1.24 mg/dL   Calcium 8.6 (L) 8.9 - 10.3 mg/dL   GFR calc non Af Amer >60 >60 mL/min   GFR calc Af Amer >60 >60 mL/min    Comment: (NOTE) The eGFR has been calculated using the CKD EPI equation. This calculation has not been validated in all clinical situations. eGFR's persistently <60 mL/min signify possible Chronic Kidney Disease.    Anion gap 9 5 - 15  CBC     Status: Abnormal   Collection Time: 12/03/16  3:46 AM  Result Value Ref Range   WBC 6.6 4.0 - 10.5 K/uL   RBC 3.77 (L) 4.22 - 5.81 MIL/uL   Hemoglobin 9.3 (L) 13.0 - 17.0 g/dL   HCT 31.4 (L) 39.0 - 52.0 %   MCV 83.3 78.0 - 100.0 fL   MCH 24.7 (L) 26.0 - 34.0 pg   MCHC 29.6 (L) 30.0 - 36.0 g/dL   RDW 15.9 (H) 11.5 - 15.5 %  Platelets 188 150 - 400 K/uL  Lactate dehydrogenase     Status: None   Collection Time: 12/03/16  3:46 AM  Result Value Ref Range   LDH 171 98 - 192 U/L  Protime-INR     Status: Abnormal   Collection Time: 12/03/16  3:46 AM  Result Value Ref Range   Prothrombin Time 17.7 (H) 11.4 - 15.2 seconds   INR 1.44   .Cooxemetry Panel (carboxy, met, total hgb, O2 sat)     Status: Abnormal   Collection Time: 12/03/16  3:55 AM  Result Value Ref Range   Total hemoglobin 9.5 (L) 12.0 - 16.0 g/dL   O2 Saturation 61.5 %   Carboxyhemoglobin 1.3 0.5 - 1.5 %   Methemoglobin 0.8 0.0 - 1.5 %  .Cooxemetry Panel (carboxy, met, total hgb, O2 sat)     Status: Abnormal   Collection Time: 12/03/16  5:00 AM  Result Value Ref Range   Total hemoglobin 9.0 (L) 12.0 - 16.0 g/dL   O2 Saturation 54.5 %   Carboxyhemoglobin 1.3 0.5 - 1.5 %   Methemoglobin 1.1 0.0 - 1.5 %  Glucose, capillary     Status: Abnormal   Collection Time: 12/03/16  8:22 AM  Result Value Ref Range   Glucose-Capillary 127 (H) 65 - 99 mg/dL  Glucose, capillary     Status: Abnormal   Collection Time: 12/03/16 11:35 AM  Result Value Ref Range   Glucose-Capillary  214 (H) 65 - 99 mg/dL  Heparin level (unfractionated)     Status: None   Collection Time: 12/03/16 12:30 PM  Result Value Ref Range   Heparin Unfractionated 0.40 0.30 - 0.70 IU/mL    Comment:        IF HEPARIN RESULTS ARE BELOW EXPECTED VALUES, AND PATIENT DOSAGE HAS BEEN CONFIRMED, SUGGEST FOLLOW UP TESTING OF ANTITHROMBIN III LEVELS.   Glucose, capillary     Status: Abnormal   Collection Time: 12/03/16  3:50 PM  Result Value Ref Range   Glucose-Capillary 135 (H) 65 - 99 mg/dL  Glucose, capillary     Status: Abnormal   Collection Time: 12/03/16  9:45 PM  Result Value Ref Range   Glucose-Capillary 139 (H) 65 - 99 mg/dL  Protime-INR     Status: Abnormal   Collection Time: 12/04/16  4:06 AM  Result Value Ref Range   Prothrombin Time 20.0 (H) 11.4 - 15.2 seconds   INR 1.68   Lactate dehydrogenase     Status: None   Collection Time: 12/04/16  4:06 AM  Result Value Ref Range   LDH 163 98 - 192 U/L  Heparin level (unfractionated)     Status: None   Collection Time: 12/04/16  4:06 AM  Result Value Ref Range   Heparin Unfractionated 0.45 0.30 - 0.70 IU/mL    Comment:        IF HEPARIN RESULTS ARE BELOW EXPECTED VALUES, AND PATIENT DOSAGE HAS BEEN CONFIRMED, SUGGEST FOLLOW UP TESTING OF ANTITHROMBIN III LEVELS.   Basic metabolic panel     Status: Abnormal   Collection Time: 12/04/16  4:06 AM  Result Value Ref Range   Sodium 136 135 - 145 mmol/L   Potassium 3.8 3.5 - 5.1 mmol/L   Chloride 93 (L) 101 - 111 mmol/L   CO2 34 (H) 22 - 32 mmol/L   Glucose, Bld 169 (H) 65 - 99 mg/dL   BUN 17 6 - 20 mg/dL   Creatinine, Ser 1.05 0.61 - 1.24 mg/dL   Calcium 8.7 (L)  8.9 - 10.3 mg/dL   GFR calc non Af Amer >60 >60 mL/min   GFR calc Af Amer >60 >60 mL/min    Comment: (NOTE) The eGFR has been calculated using the CKD EPI equation. This calculation has not been validated in all clinical situations. eGFR's persistently <60 mL/min signify possible Chronic Kidney Disease.    Anion gap  9 5 - 15  CBC     Status: Abnormal   Collection Time: 12/04/16  4:06 AM  Result Value Ref Range   WBC 5.8 4.0 - 10.5 K/uL   RBC 3.61 (L) 4.22 - 5.81 MIL/uL   Hemoglobin 9.1 (L) 13.0 - 17.0 g/dL   HCT 30.1 (L) 39.0 - 52.0 %   MCV 83.4 78.0 - 100.0 fL   MCH 25.2 (L) 26.0 - 34.0 pg   MCHC 30.2 30.0 - 36.0 g/dL   RDW 15.8 (H) 11.5 - 15.5 %   Platelets 183 150 - 400 K/uL  .Cooxemetry Panel (carboxy, met, total hgb, O2 sat)     Status: Abnormal   Collection Time: 12/04/16  5:00 AM  Result Value Ref Range   Total hemoglobin 9.0 (L) 12.0 - 16.0 g/dL   O2 Saturation 68.5 %   Carboxyhemoglobin 1.8 (H) 0.5 - 1.5 %   Methemoglobin 1.0 0.0 - 1.5 %  Glucose, capillary     Status: Abnormal   Collection Time: 12/04/16  7:58 AM  Result Value Ref Range   Glucose-Capillary 202 (H) 65 - 99 mg/dL  Glucose, capillary     Status: Abnormal   Collection Time: 12/04/16 11:56 AM  Result Value Ref Range   Glucose-Capillary 124 (H) 65 - 99 mg/dL  Glucose, capillary     Status: Abnormal   Collection Time: 12/04/16  5:06 PM  Result Value Ref Range   Glucose-Capillary 117 (H) 65 - 99 mg/dL  Glucose, capillary     Status: Abnormal   Collection Time: 12/04/16  9:11 PM  Result Value Ref Range   Glucose-Capillary 119 (H) 65 - 99 mg/dL  Lactate dehydrogenase     Status: None   Collection Time: 12/05/16  3:52 AM  Result Value Ref Range   LDH 171 98 - 192 U/L  Heparin level (unfractionated)     Status: None   Collection Time: 12/05/16  3:52 AM  Result Value Ref Range   Heparin Unfractionated 0.53 0.30 - 0.70 IU/mL    Comment:        IF HEPARIN RESULTS ARE BELOW EXPECTED VALUES, AND PATIENT DOSAGE HAS BEEN CONFIRMED, SUGGEST FOLLOW UP TESTING OF ANTITHROMBIN III LEVELS.   Basic metabolic panel     Status: Abnormal   Collection Time: 12/05/16  3:52 AM  Result Value Ref Range   Sodium 137 135 - 145 mmol/L   Potassium 4.0 3.5 - 5.1 mmol/L   Chloride 94 (L) 101 - 111 mmol/L   CO2 36 (H) 22 - 32 mmol/L     Glucose, Bld 142 (H) 65 - 99 mg/dL   BUN 18 6 - 20 mg/dL   Creatinine, Ser 1.11 0.61 - 1.24 mg/dL   Calcium 8.7 (L) 8.9 - 10.3 mg/dL   GFR calc non Af Amer >60 >60 mL/min   GFR calc Af Amer >60 >60 mL/min    Comment: (NOTE) The eGFR has been calculated using the CKD EPI equation. This calculation has not been validated in all clinical situations. eGFR's persistently <60 mL/min signify possible Chronic Kidney Disease.    Anion gap 7 5 -  15  CBC     Status: Abnormal   Collection Time: 12/05/16  3:52 AM  Result Value Ref Range   WBC 7.1 4.0 - 10.5 K/uL   RBC 3.74 (L) 4.22 - 5.81 MIL/uL   Hemoglobin 9.3 (L) 13.0 - 17.0 g/dL   HCT 31.2 (L) 39.0 - 52.0 %   MCV 83.4 78.0 - 100.0 fL   MCH 24.9 (L) 26.0 - 34.0 pg   MCHC 29.8 (L) 30.0 - 36.0 g/dL   RDW 15.9 (H) 11.5 - 15.5 %   Platelets 195 150 - 400 K/uL  Protime-INR     Status: Abnormal   Collection Time: 12/05/16  3:52 AM  Result Value Ref Range   Prothrombin Time 20.4 (H) 11.4 - 15.2 seconds   INR 1.73   .Cooxemetry Panel (carboxy, met, total hgb, O2 sat)     Status: Abnormal   Collection Time: 12/05/16  4:20 AM  Result Value Ref Range   Total hemoglobin 9.6 (L) 12.0 - 16.0 g/dL   O2 Saturation 53.1 %   Carboxyhemoglobin 1.3 0.5 - 1.5 %   Methemoglobin 1.2 0.0 - 1.5 %  Glucose, capillary     Status: Abnormal   Collection Time: 12/05/16  7:54 AM  Result Value Ref Range   Glucose-Capillary 142 (H) 65 - 99 mg/dL  Glucose, capillary     Status: Abnormal   Collection Time: 12/05/16 11:43 AM  Result Value Ref Range   Glucose-Capillary 134 (H) 65 - 99 mg/dL  Heparin level (unfractionated)     Status: None   Collection Time: 12/05/16  2:16 PM  Result Value Ref Range   Heparin Unfractionated 0.41 0.30 - 0.70 IU/mL    Comment:        IF HEPARIN RESULTS ARE BELOW EXPECTED VALUES, AND PATIENT DOSAGE HAS BEEN CONFIRMED, SUGGEST FOLLOW UP TESTING OF ANTITHROMBIN III LEVELS.   Marland KitchenCooxemetry Panel (carboxy, met, total hgb, O2 sat)      Status: Abnormal   Collection Time: 12/05/16  3:05 PM  Result Value Ref Range   Total hemoglobin 10.2 (L) 12.0 - 16.0 g/dL   O2 Saturation 44.2 %   Carboxyhemoglobin 1.5 0.5 - 1.5 %   Methemoglobin 0.8 0.0 - 1.5 %  Carboxyhemoglobin (single result)     Status: None   Collection Time: 12/05/16  3:50 PM  Result Value Ref Range   Carboxyhemoglobin 1.2 0.5 - 1.5 %  Glucose, capillary     Status: Abnormal   Collection Time: 12/05/16  3:54 PM  Result Value Ref Range   Glucose-Capillary 188 (H) 65 - 99 mg/dL  Cooxemetry Panel (carboxy, met, total hgb, O2 sat)     Status: Abnormal   Collection Time: 12/05/16  8:45 PM  Result Value Ref Range   Total hemoglobin 9.9 (L) 12.0 - 16.0 g/dL   O2 Saturation 54.1 %   Carboxyhemoglobin 1.2 0.5 - 1.5 %   Methemoglobin 1.3 0.0 - 1.5 %  Glucose, capillary     Status: Abnormal   Collection Time: 12/05/16  9:02 PM  Result Value Ref Range   Glucose-Capillary 221 (H) 65 - 99 mg/dL  Lactate dehydrogenase     Status: None   Collection Time: 12/06/16  3:41 AM  Result Value Ref Range   LDH 166 98 - 192 U/L  Heparin level (unfractionated)     Status: None   Collection Time: 12/06/16  3:41 AM  Result Value Ref Range   Heparin Unfractionated 0.44 0.30 - 0.70 IU/mL  Comment:        IF HEPARIN RESULTS ARE BELOW EXPECTED VALUES, AND PATIENT DOSAGE HAS BEEN CONFIRMED, SUGGEST FOLLOW UP TESTING OF ANTITHROMBIN III LEVELS.   Basic metabolic panel     Status: Abnormal   Collection Time: 12/06/16  3:41 AM  Result Value Ref Range   Sodium 134 (L) 135 - 145 mmol/L   Potassium 4.1 3.5 - 5.1 mmol/L   Chloride 93 (L) 101 - 111 mmol/L   CO2 36 (H) 22 - 32 mmol/L   Glucose, Bld 182 (H) 65 - 99 mg/dL   BUN 19 6 - 20 mg/dL   Creatinine, Ser 1.17 0.61 - 1.24 mg/dL   Calcium 8.9 8.9 - 10.3 mg/dL   GFR calc non Af Amer >60 >60 mL/min   GFR calc Af Amer >60 >60 mL/min    Comment: (NOTE) The eGFR has been calculated using the CKD EPI equation. This  calculation has not been validated in all clinical situations. eGFR's persistently <60 mL/min signify possible Chronic Kidney Disease.    Anion gap 5 5 - 15  CBC     Status: Abnormal   Collection Time: 12/06/16  3:41 AM  Result Value Ref Range   WBC 6.6 4.0 - 10.5 K/uL   RBC 3.75 (L) 4.22 - 5.81 MIL/uL   Hemoglobin 9.7 (L) 13.0 - 17.0 g/dL   HCT 31.1 (L) 39.0 - 52.0 %   MCV 82.9 78.0 - 100.0 fL   MCH 25.9 (L) 26.0 - 34.0 pg   MCHC 31.2 30.0 - 36.0 g/dL   RDW 16.2 (H) 11.5 - 15.5 %   Platelets 187 150 - 400 K/uL  Protime-INR     Status: Abnormal   Collection Time: 12/06/16  3:41 AM  Result Value Ref Range   Prothrombin Time 21.3 (H) 11.4 - 15.2 seconds   INR 1.82   Cooxemetry Panel (carboxy, met, total hgb, O2 sat)     Status: Abnormal   Collection Time: 12/06/16  4:10 AM  Result Value Ref Range   Total hemoglobin 12.3 12.0 - 16.0 g/dL   O2 Saturation 59.1 %   Carboxyhemoglobin 1.7 (H) 0.5 - 1.5 %   Methemoglobin 0.7 0.0 - 1.5 %  Glucose, capillary     Status: Abnormal   Collection Time: 12/06/16  7:47 AM  Result Value Ref Range   Glucose-Capillary 144 (H) 65 - 99 mg/dL  Glucose, capillary     Status: Abnormal   Collection Time: 12/06/16 11:41 AM  Result Value Ref Range   Glucose-Capillary 174 (H) 65 - 99 mg/dL  Drug Screen 10 W/Conf, Serum     Status: None   Collection Time: 12/08/16  3:42 PM  Result Value Ref Range   Amphetamines, IA Negative Cutoff:50 ng/mL   Barbiturates, IA Negative Cutoff:0.1 ug/mL   Benzodiazepines, IA Negative Cutoff:20 ng/mL   Cocaine & Metabolite, IA Negative Cutoff:25 ng/mL   Phencyclidine, IA Negative Cutoff:8 ng/mL   THC(Marijuana) Metabolite, IA Negative Cutoff:5 ng/mL   Opiates, IA Negative Cutoff:5 ng/mL   Oxycodones, IA Negative Cutoff:5 ng/mL   Methadone, IA Negative Cutoff:25 ng/mL   Propoxyphene, IA Negative Cutoff:50 ng/mL    Comment: (NOTE) This test was developed and its performance characteristics determined by [facility].  It  has not been cleared or approved by the Food and Drug Administration. Performed At: [facility] [place], [place] [id number] Rondell Reams MD [id number]   Protime-INR     Status: Abnormal  Collection Time: 12/08/16  3:42 PM  Result Value Ref Range   Prothrombin Time 16.8 (H) 11.4 - 15.2 seconds   INR 1.35   Nicotine/cotinine metabolites     Status: None   Collection Time: 12/08/16  3:42 PM  Result Value Ref Range   Nicotine None Detected ng/mL    Comment: (NOTE) Nicotine levels greater than 2.0 are consistent with the use of tobacco or tobacco cessation products.    Cotinine None Detected ng/mL    Comment: (NOTE) Cotinine levels greater than 20.0 are consistent with the use of tobacco or tobacco cessation products. Performed At: [facility] [place], [place] [id number] Lindon Romp MD [id number]   .Cooxemetry Panel (carboxy, met, total hgb, O2 sat)     Status: Abnormal   Collection Time: 12/08/16  4:05 PM  Result Value Ref Range   Total hemoglobin 10.6 (L) 12.0 - 16.0 g/dL   O2 Saturation 55.2 %   Carboxyhemoglobin 1.2 0.5 - 1.5 %   Methemoglobin 1.1 0.0 - 1.5 %  Basic metabolic panel     Status: Abnormal   Collection Time: 12/14/16 12:02 PM  Result Value Ref Range   Sodium 134 (L) 135 - 145 mmol/L   Potassium 4.0 3.5 - 5.1 mmol/L   Chloride 93 (L) 101 - 111 mmol/L   CO2 32 22 - 32 mmol/L   Glucose, Bld 131 (H) 65 - 99 mg/dL   BUN 21 (H) 6 - 20 mg/dL   Creatinine, Ser 1.09 0.61 - 1.24 mg/dL   Calcium 8.6 (L) 8.9 - 10.3 mg/dL   GFR calc non Af Amer >60 >60 mL/min   GFR calc Af Amer >60 >60 mL/min    Comment: (NOTE) The eGFR has been calculated using the CKD EPI equation. This calculation has not been validated in all clinical situations. eGFR's persistently <60 mL/min signify possible Chronic Kidney Disease.    Anion gap 9 5 - 15  CBC     Status: Abnormal   Collection Time: 12/14/16 12:02 PM  Result  Value Ref Range   WBC 7.8 4.0 - 10.5 K/uL   RBC 3.84 (L) 4.22 - 5.81 MIL/uL   Hemoglobin 9.7 (L) 13.0 - 17.0 g/dL   HCT 32.0 (L) 39.0 - 52.0 %   MCV 83.3 78.0 - 100.0 fL   MCH 25.3 (L) 26.0 - 34.0 pg   MCHC 30.3 30.0 - 36.0 g/dL   RDW 16.7 (H) 11.5 - 15.5 %   Platelets 205 150 - 400 K/uL  Protime-INR     Status: Abnormal   Collection Time: 12/14/16 12:02 PM  Result Value Ref Range   Prothrombin Time 19.8 (H) 11.4 - 15.2 seconds   INR 1.66   Lactate dehydrogenase     Status: Abnormal   Collection Time: 12/14/16 12:02 PM  Result Value Ref Range   LDH 207 (H) 98 - 192 U/L  .Cooxemetry Panel (carboxy, met, total hgb, O2 sat)     Status: Abnormal   Collection Time: 12/14/16 12:05 PM  Result Value Ref Range   Total hemoglobin 9.9 (L) 12.0 - 16.0 g/dL   O2 Saturation 46.8 %   Carboxyhemoglobin 0.9 0.5 - 1.5 %   Methemoglobin 1.2 0.0 - 1.5 %  Protime-INR     Status: Abnormal   Collection Time: 12/22/16 12:58 PM  Result Value Ref Range   Prothrombin Time 21.2 (H) 11.4 - 15.2 seconds   INR 1.81   Cooxemetry Panel (carboxy, met, total hgb, O2  sat)     Status: Abnormal   Collection Time: 12/22/16  1:00 PM  Result Value Ref Range   Total hemoglobin 10.0 (L) 12.0 - 16.0 g/dL   O2 Saturation 48.5 %   Carboxyhemoglobin 1.5 0.5 - 1.5 %   Methemoglobin 0.7 0.0 - 1.5 %  Basic metabolic panel     Status: Abnormal   Collection Time: 12/29/16 11:24 AM  Result Value Ref Range   Sodium 127 (L) 135 - 145 mmol/L   Potassium 3.2 (L) 3.5 - 5.1 mmol/L   Chloride 81 (L) 101 - 111 mmol/L   CO2 32 22 - 32 mmol/L   Glucose, Bld 181 (H) 65 - 99 mg/dL   BUN 85 (H) 6 - 20 mg/dL   Creatinine, Ser 1.60 (H) 0.61 - 1.24 mg/dL   Calcium 8.9 8.9 - 10.3 mg/dL   GFR calc non Af Amer 45 (L) >60 mL/min   GFR calc Af Amer 52 (L) >60 mL/min    Comment: (NOTE) The eGFR has been calculated using the CKD EPI equation. This calculation has not been validated in all clinical situations. eGFR's persistently <60  mL/min signify possible Chronic Kidney Disease.    Anion gap 14 5 - 15  Carboxyhemoglobin (single result)     Status: None   Collection Time: 12/29/16 11:24 AM  Result Value Ref Range   Carboxyhemoglobin 1.0 0.5 - 1.5 %  CBC     Status: Abnormal   Collection Time: 12/29/16 11:24 AM  Result Value Ref Range   WBC 7.7 4.0 - 10.5 K/uL   RBC 4.13 (L) 4.22 - 5.81 MIL/uL   Hemoglobin 10.0 (L) 13.0 - 17.0 g/dL   HCT 32.0 (L) 39.0 - 52.0 %   MCV 77.5 (L) 78.0 - 100.0 fL   MCH 24.2 (L) 26.0 - 34.0 pg   MCHC 31.3 30.0 - 36.0 g/dL   RDW 16.8 (H) 11.5 - 15.5 %   Platelets 226 150 - 400 K/uL  Protime-INR     Status: Abnormal   Collection Time: 12/29/16 11:24 AM  Result Value Ref Range   Prothrombin Time 21.0 (H) 11.4 - 15.2 seconds   INR 1.78   Lactate dehydrogenase     Status: Abnormal   Collection Time: 12/29/16 11:24 AM  Result Value Ref Range   LDH 233 (H) 98 - 192 U/L  .Cooxemetry Panel (carboxy, met, total hgb, O2 sat)     Status: Abnormal   Collection Time: 01/03/17  3:10 PM  Result Value Ref Range   Total hemoglobin 9.4 (L) 12.0 - 16.0 g/dL   O2 Saturation 63.7 %   Carboxyhemoglobin 1.3 0.5 - 1.5 %   Methemoglobin 1.4 0.0 - 1.5 %  Basic metabolic panel     Status: Abnormal   Collection Time: 01/05/17 11:12 AM  Result Value Ref Range   Sodium 130 (L) 135 - 145 mmol/L   Potassium 5.0 3.5 - 5.1 mmol/L   Chloride 92 (L) 101 - 111 mmol/L   CO2 29 22 - 32 mmol/L   Glucose, Bld 177 (H) 65 - 99 mg/dL   BUN 26 (H) 6 - 20 mg/dL   Creatinine, Ser 1.27 (H) 0.61 - 1.24 mg/dL   Calcium 8.6 (L) 8.9 - 10.3 mg/dL   GFR calc non Af Amer 60 (L) >60 mL/min   GFR calc Af Amer >60 >60 mL/min    Comment: (NOTE) The eGFR has been calculated using the CKD EPI equation. This calculation has not been validated in  all clinical situations. eGFR's persistently <60 mL/min signify possible Chronic Kidney Disease.    Anion gap 9 5 - 15  CBC     Status: Abnormal   Collection Time: 01/05/17 11:12 AM   Result Value Ref Range   WBC 8.0 4.0 - 10.5 K/uL   RBC 3.88 (L) 4.22 - 5.81 MIL/uL   Hemoglobin 9.4 (L) 13.0 - 17.0 g/dL   HCT 31.0 (L) 39.0 - 52.0 %   MCV 79.9 78.0 - 100.0 fL   MCH 24.2 (L) 26.0 - 34.0 pg   MCHC 30.3 30.0 - 36.0 g/dL   RDW 17.6 (H) 11.5 - 15.5 %   Platelets 177 150 - 400 K/uL  Protime-INR     Status: Abnormal   Collection Time: 01/05/17 11:12 AM  Result Value Ref Range   Prothrombin Time 21.9 (H) 11.4 - 15.2 seconds   INR 1.88   Lactate dehydrogenase     Status: Abnormal   Collection Time: 01/05/17 11:12 AM  Result Value Ref Range   LDH 237 (H) 98 - 192 U/L  .Cooxemetry Panel (carboxy, met, total hgb, O2 sat)     Status: Abnormal   Collection Time: 01/05/17 11:20 AM  Result Value Ref Range   Total hemoglobin 9.6 (L) 12.0 - 16.0 g/dL   O2 Saturation 75.3 %   Carboxyhemoglobin 1.3 0.5 - 1.5 %   Methemoglobin 1.3 0.0 - 1.5 %  Protime-INR     Status: Abnormal   Collection Time: 01/11/17 11:59 AM  Result Value Ref Range   Prothrombin Time 31.0 (H) 11.4 - 15.2 seconds   INR 2.91      Past Medical History:  Diagnosis Date  . AICD (automatic cardioverter/defibrillator) present   . ASCVD (arteriosclerotic cardiovascular disease)   . Chronic systolic CHF (congestive heart failure) ([place])   . COPD (chronic obstructive pulmonary disease) ([place])   . Coronary artery disease   . Depression   . Diabetes mellitus   . History of cocaine abuse   . Hypertension   . Presence of permanent cardiac pacemaker   . Shortness of breath dyspnea   . Suicidal ideation   . Tobacco abuse     Current Outpatient Prescriptions  Medication Sig Dispense Refill  . amiodarone (PACERONE) 200 MG tablet Take 1 tablet (200 mg total) by mouth daily. 90 tablet 6  . aspirin EC 81 MG EC tablet Take 1 tablet (81 mg total) by mouth daily. 30 tablet 6  . citalopram (CELEXA) 10 MG tablet Take 1 tablet (10 mg total) by mouth daily. 90 tablet 6  . digoxin (LANOXIN) 0.125 MG tablet Take 1 tablet  (125 mcg total) by mouth daily. 90 tablet 3  . docusate sodium (COLACE) 100 MG capsule Take 2 capsules (200 mg total) by mouth daily. 10 capsule 0  . gabapentin (NEURONTIN) 100 MG capsule Take 2 capsules (200 mg total) by mouth 2 (two) times daily. 180 capsule 3  . insulin aspart (NOVOLOG FLEXPEN) 100 UNIT/ML FlexPen Inject 10 Units into the skin 3 (three) times daily with meals. 15 mL 11  . Insulin Glargine (LANTUS SOLOSTAR) 100 UNIT/ML Solostar Pen Inject 20 Units into the skin daily at 10 pm.    . losartan (COZAAR) 25 MG tablet Take 1 tablet (25 mg total) by mouth daily. 30 tablet 6  . magnesium oxide (MAG-OX) 400 (241.3 Mg) MG tablet Take 1 tablet (400 mg total) by mouth daily. 30 tablet 6  . metolazone (ZAROXOLYN) 2.5 MG tablet Take 2.5 mg  by mouth as directed. By [facility]    . milrinone (PRIMACOR) 20 MG/100 ML SOLN infusion Inject 25.175 mcg/min into the vein continuous. Per [facility] pharmacy 100 mL 6  . pantoprazole (PROTONIX) 40 MG tablet Take 1 tablet (40 mg total) by mouth daily. 30 tablet 6  . potassium chloride SA (K-DUR,KLOR-CON) 20 MEQ tablet Take 20 mEq by mouth 2 (two) times daily. Take additional 20 mEq tablet on metolazone days    . sildenafil (REVATIO) 20 MG tablet Take 1 tablet (20 mg total) by mouth 3 (three) times daily. 90 tablet 2  . spironolactone (ALDACTONE) 25 MG tablet Take 1 tablet (25 mg total) by mouth daily. 30 tablet 6  . torsemide (DEMADEX) 20 MG tablet Take 60 mgs in the morning and 62ms in the evening. 150 tablet 12  . warfarin (COUMADIN) 5 MG tablet Take 2-3 tablets (10-15 mg total) by mouth daily. As directed based on blood checks 90 tablet 6   No current facility-administered medications for this encounter.     Codeine; Trazodone and nefazodone; Lipitor [atorvastatin]; and Tape  REVIEW OF SYSTEMS: All systems negative except as listed in HPI, PMH and Problem list.  Vital Signs:  Vitals:   01/13/17 1006 01/13/17 1009  BP: (!) 100/0 116/75   Pulse: (!) 110      Personally reviewed by ADarrick Grinder  Doppler Pressure:98  Weight today: 246 pounds  Last Weight: 238  Pounds.    Wt Readings from Last 3 Encounters:  01/13/17 246 lb (111.6 kg)  01/11/17 238 lb (108 kg)  12/29/16 236 lb 12.8 oz (107.4 kg)      Physical Exam: GENERAL: Arrived in a wheel chair. Appears fatigued.  HEENT: normal  NECK: Supple, JVP to jaw .  2+ bilaterally, no bruits.  No lymphadenopathy or thyromegaly appreciated.   CARDIAC:  Mechanical heart sounds with LVAD hum present.  LUNGS:  Clear to auscultation bilaterally.  ABDOMEN:  Soft, round, nontender, + distended  positive bowel sounds x4.     LVAD exit site: well-healed and incorporated.  Dressing dry and intact.  No erythema or drainage.  Stabilization device present and accurately applied.  Driveline dressing is being changed daily per sterile technique. EXTREMITIES:  Warm and dry, no cyanosis, clubbing, rash. R and LLE trace-1+  RUE PICC no erythema. Nontender    1. Acute/ Chronic end-stage biventricular systolic HF: LVEF 170%due to ICM -> s/p Echo 08/24/16 LVEF 15%, RV mild dilated, moderately reduced. --> S/P HM3 LVAD 126/12/7856   -  Complicated by ongoing RV failure -  NYHA III. Volume status elevated despite increasing diuretics. Given 80 mg IV lasix + 20 meq potassium in the clinic.  Continue milrinone 0.375 mcg.  Increase torsemide to 60 mg twice a day and add metolazone 2.5 mg every Saturday.  Continue coumadin with goal 2-2.5 + asa 81 mg daily.   Avoid lovenox in future due to epistaxis.  2. CAD: No CP . 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  Continue current regimen.   3. DMII:   - Hgb A1C 8.1 on 09/09/2016. Today we referred him to Dr TDerrel Nipto establish PCP follow up.   4. Tobacco/substance abuse He has stopped smoking.  - 5. Atrial fibrillation, paroxysmal Regular pulse. Continue current dose to amio . Continue while on milrinone.   6. Left  pleural effusion - underwent repeat thoracentesis in Jan. 2018.  -  Stable L small pleural effusion  on 12/01/16 with mild bibasilar atelectasis. 7. Anxiety/Depression - Follow up with Stable . Continue celexa and trazadone. Ned visit he will meet with HFSW   Follow up next week to reassess volume status. Today we went over low salt diet and fluid restrictions. Repeat labs next week.   Greater than 50% of the (total minutes 40) visit spent in counseling/coordination of care regarding low salt diet, limiting fluids, and activity level. I have encouraged him to resume cardiac rehab.    Darrick Grinder, NP  01/13/2017

## 2017-01-14 ENCOUNTER — Other Ambulatory Visit (HOSPITAL_COMMUNITY): Payer: Self-pay | Admitting: Internal Medicine

## 2017-01-14 IMAGING — CR DG CHEST 1V PORT
1 series · 1 of 1 positions shown · non-contrast
Comparison: Prior study from 04/09/2014.

CLINICAL DATA: Initial evaluation for acute cough, chest pain.

EXAM:
PORTABLE CHEST 1 VIEW

[ap]
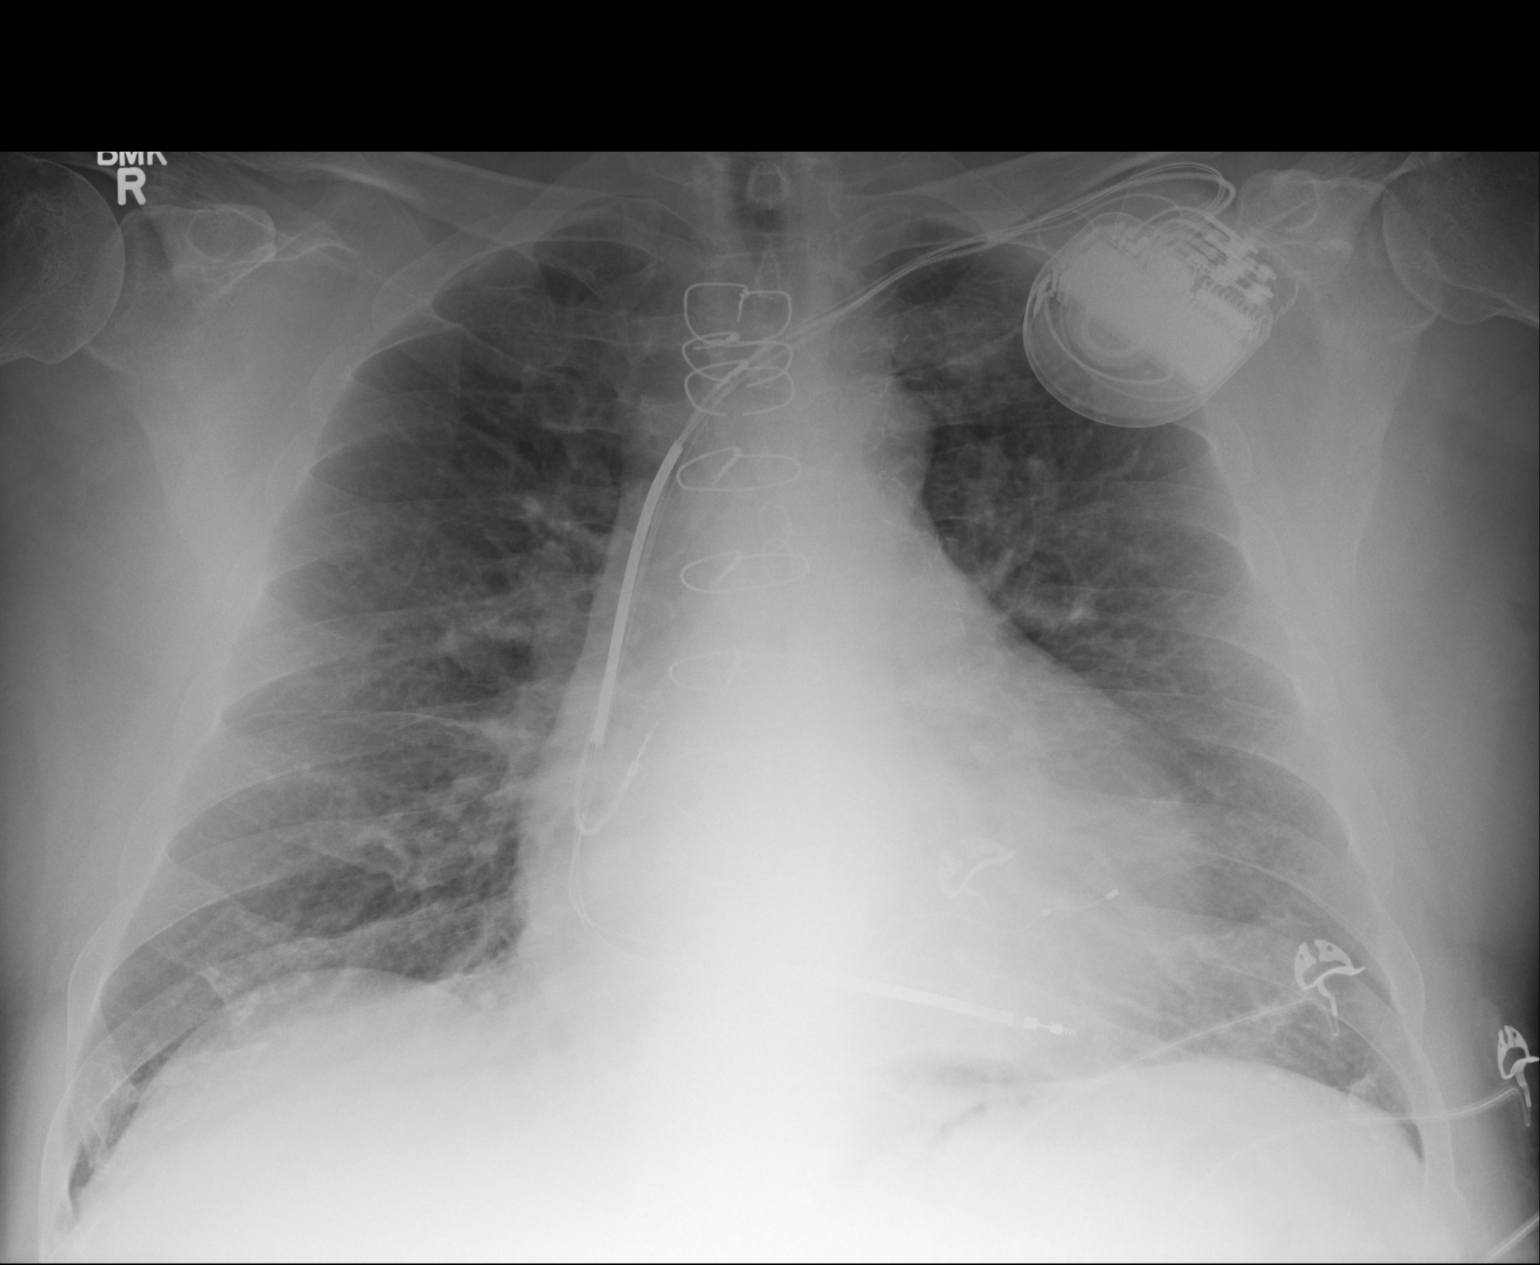

[1 of 1 positions shown; findings below may reference images not displayed]

FINDINGS: Median sternotomy wires with underlying CABG markers and surgical
clips noted. Dehiscence of a few of the sternotomy wires noted,
stable. Left-sided transvenous pacemaker/ AICD is item changed.
Cardiomegaly is unchanged. Mediastinal silhouette within normal
limits.

Lungs are normally inflated. Mild vascular congestion without overt
pulmonary edema. No pleural effusion. Mild scattered left basilar
opacity, favored to reflect atelectasis, possible developing
infiltrate could be considered in the correct clinical setting. No
pneumothorax.

No acute osseus abnormality.
IMPRESSION: 1. Stable cardiomegaly with mild pulmonary vascular congestion
without overt pulmonary edema.
2. Mild left basilar opacity, favored to reflect atelectasis,
although possible early/developing infiltrate could be considered in
the correct clinical setting.

## 2017-01-15 IMAGING — CR DG CHEST 1V PORT
1 series · 2 of 2 positions shown · non-contrast
Comparison: Chest x-rays dated 08/27/2015 and 04/09/2014.

CLINICAL DATA: Shortness of breath and chest pain, defibrillator
discharged last night.

EXAM:
PORTABLE CHEST 1 VIEW

[Series 1: ap · 0.17mm/px · 2 of 2 slices shown]
[im 1/2]
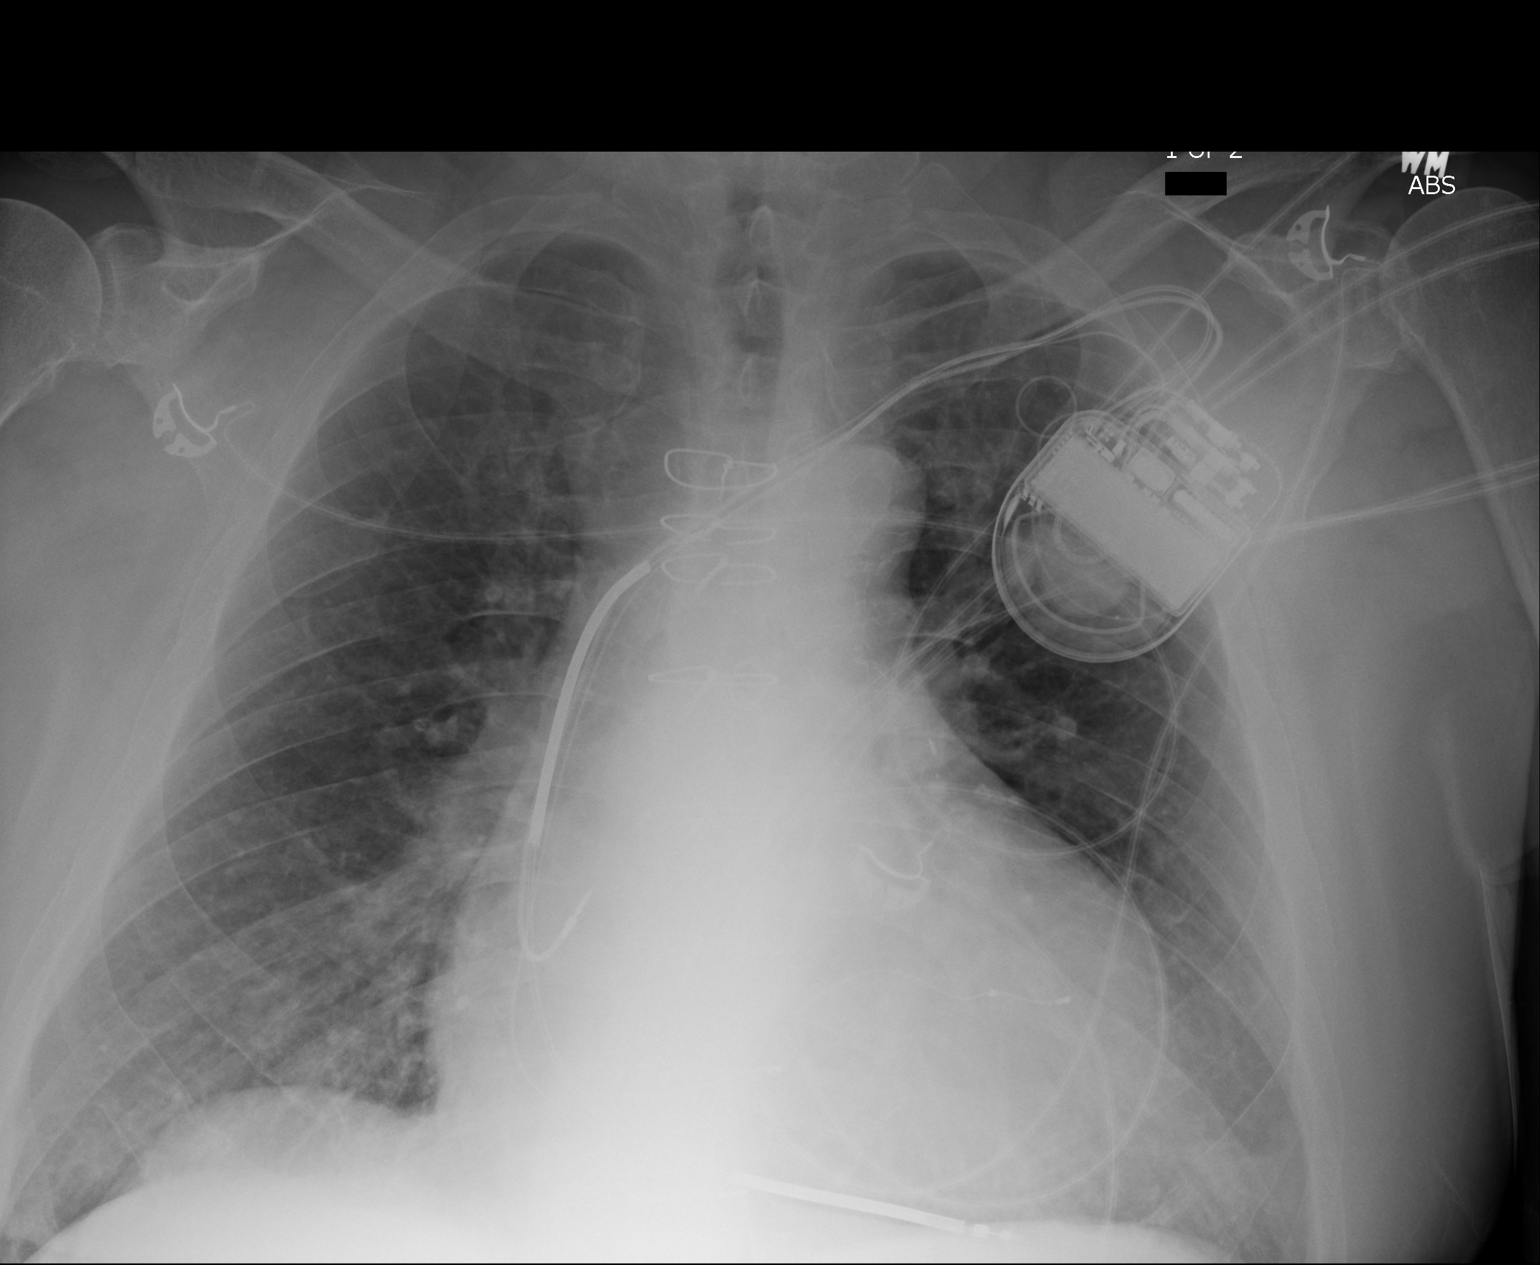
[im 2/2]
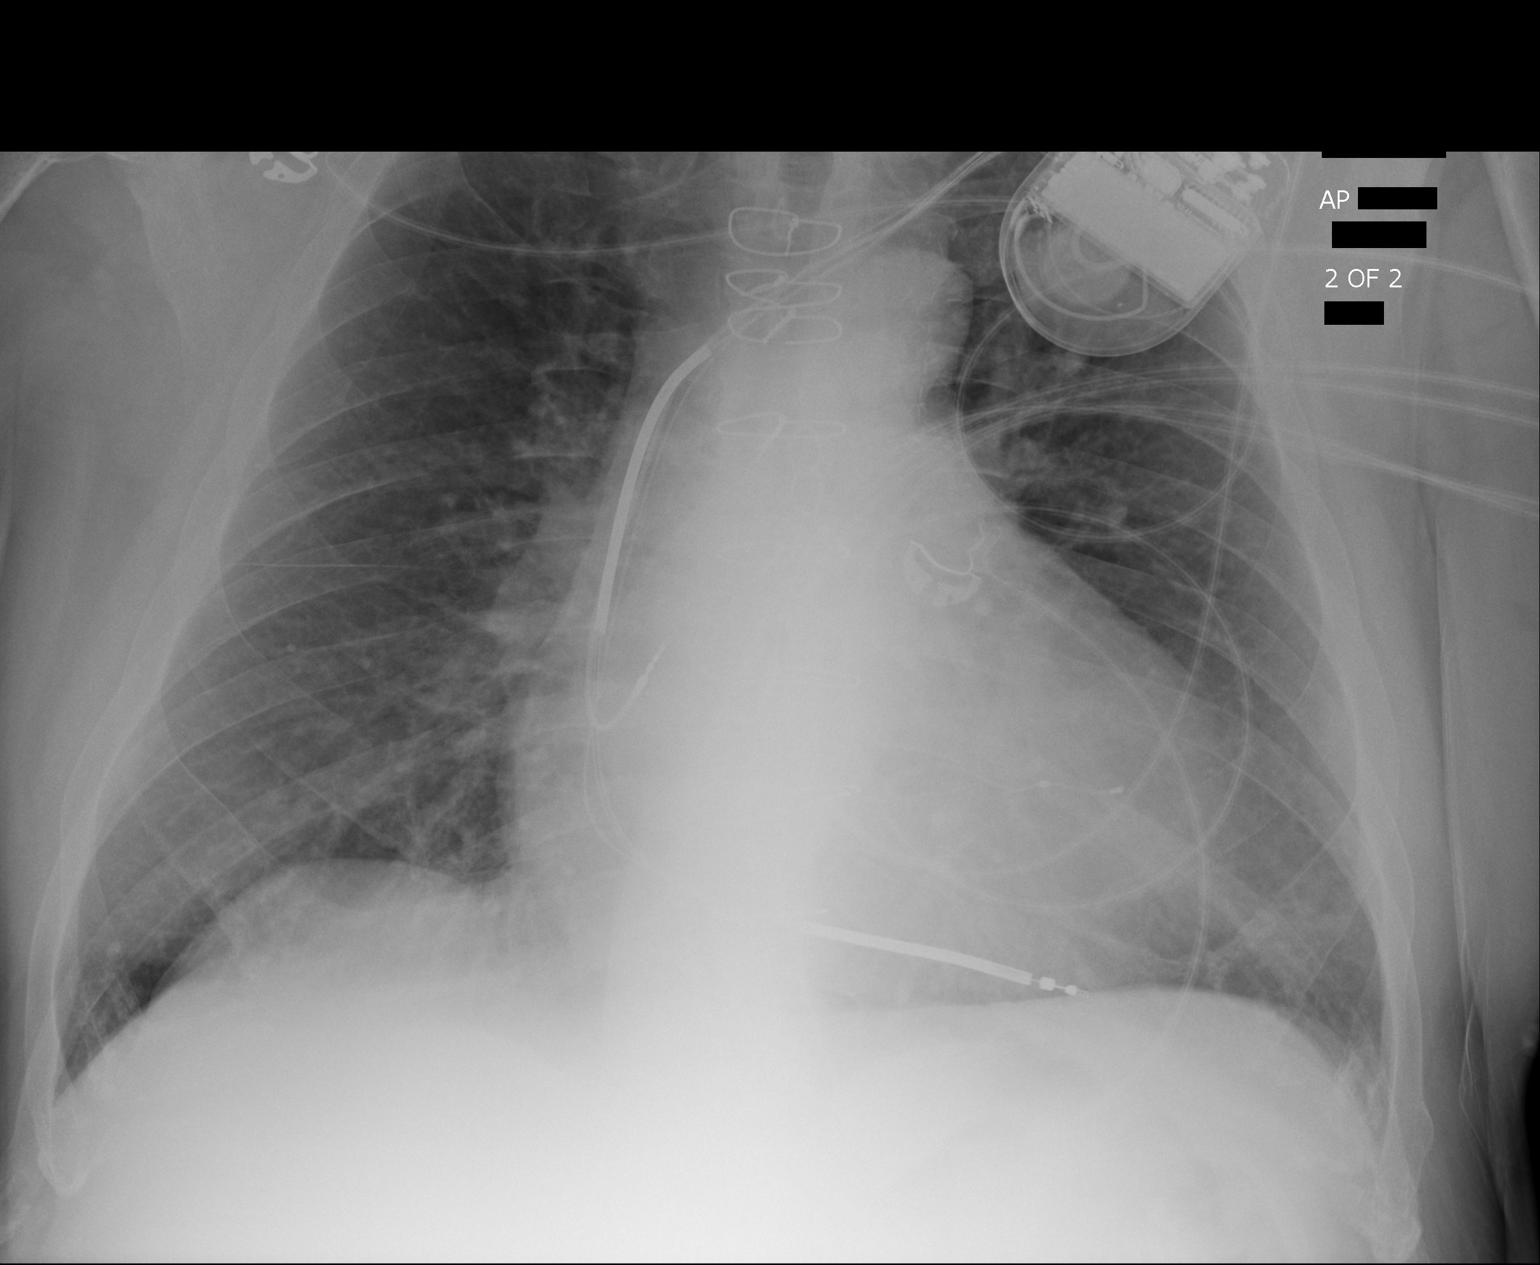

[2 of 2 positions shown; findings below may reference images not displayed]

FINDINGS: Cardiomegaly is unchanged. Left chest wall pacemaker/AICD appear
stable in position. No evidence of wire discontinuity

There is no evidence of acute infiltrate on today's exam. Subtle
opacity at the left lung base appears similar to previous chest
x-rays from 7808 indicating chronic scarring or atelectasis. Lungs
are otherwise clear. No evidence of active/acute congestive heart
failure. No confluent opacity to suggest a developing pneumonia. No
pleural effusion seen.
IMPRESSION: No evidence of acute cardiopulmonary abnormality. No evidence of
acute/active congestive heart failure or pneumonia on today's exam.
Cardiomegaly is stable.

## 2017-01-16 IMAGING — CT CT ABD-PELV W/ CM
2 of 5 series · 15 of 30 positions shown, 18 images · IV contrast (APPLIED)
Comparison: 08/28/2015 chest radiograph

CLINICAL DATA: Severe dyspnea on exertion, epigastric pain, chest

EXAM:
CT ANGIOGRAPHY CHEST
CT ABDOMEN AND PELVIS WITH CONTRAST
TECHNIQUE: Multidetector CT imaging of the chest was performed using the
standard protocol during bolus administration of intravenous
contrast. Multiplanar CT image reconstructions and MIPs were
obtained to evaluate the vascular anatomy. Multidetector CT imaging
of the abdomen and pelvis was performed using the standard protocol
during bolus administration of intravenous contrast.
CONTRAST:  100mL OMNIPAQUE IOHEXOL 350 MG/ML SOLN

[Series 5: routine abd pel with · axial · 0.96mm/px · z∈[-537,-377]mm · 3 of 95 slices shown]
[im 32/95  lung]
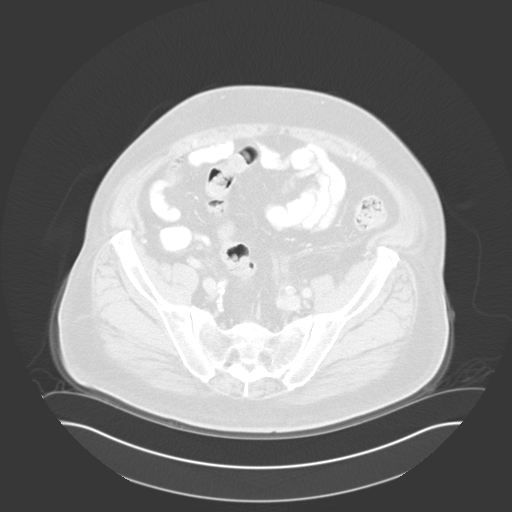
[im 63/95  lung]
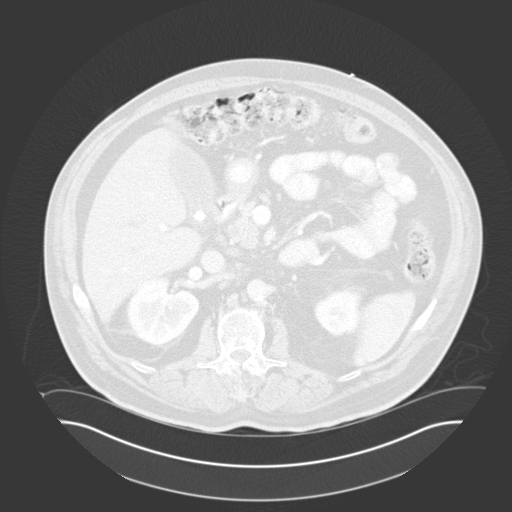
[im 64/95  lung]
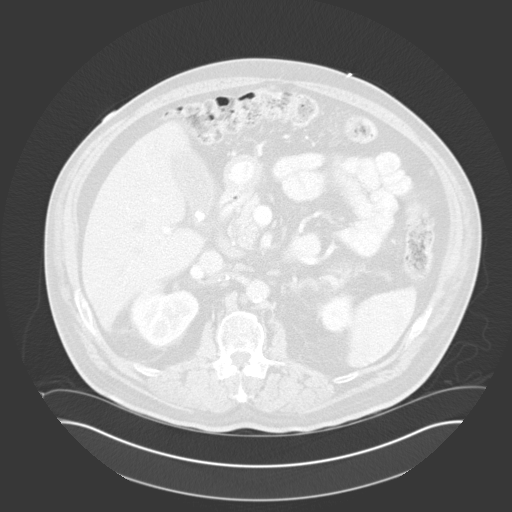

[Series 6: pe 1.0 thins · axial · 0.95mm/px · z∈[-336,-68]mm · 12 of 318 slices shown, 15 images]
[im 25/318  mediastinal]
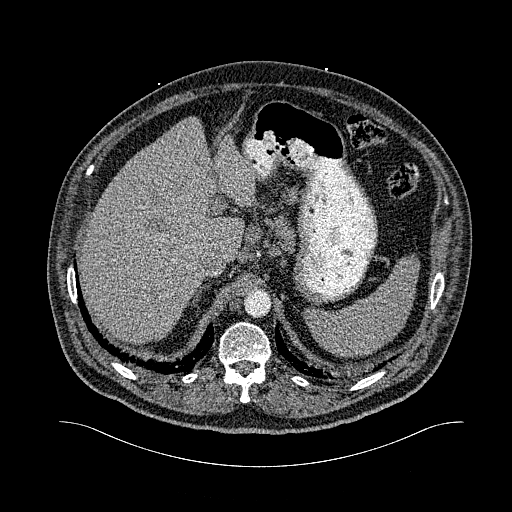
[im 25/318  lung]
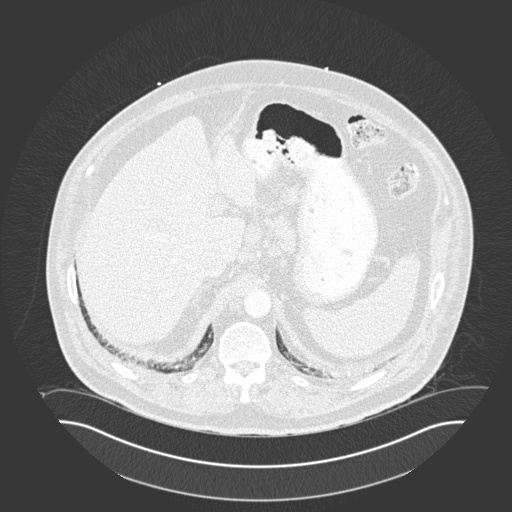
[im 49/318  lung]
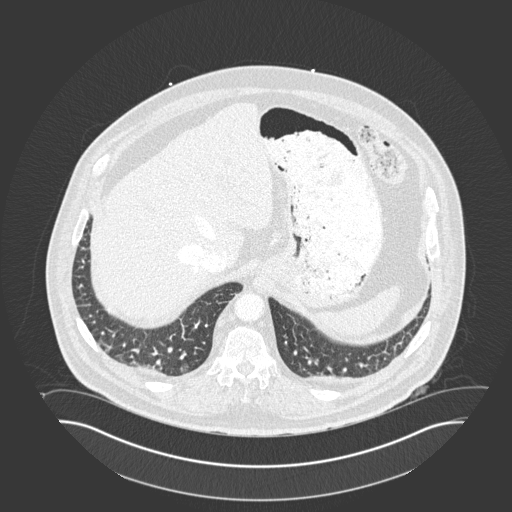
[im 74/318  lung]
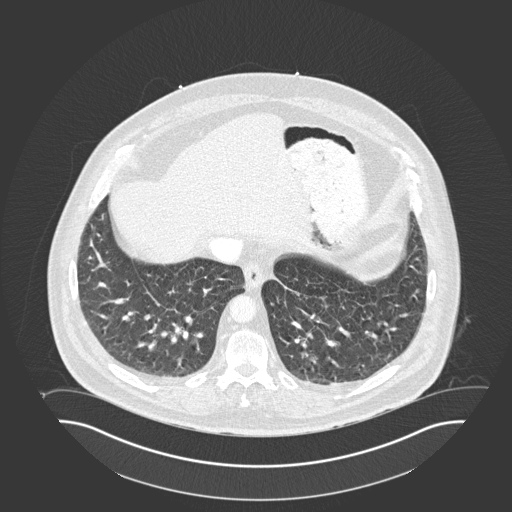
[im 98/318  lung]
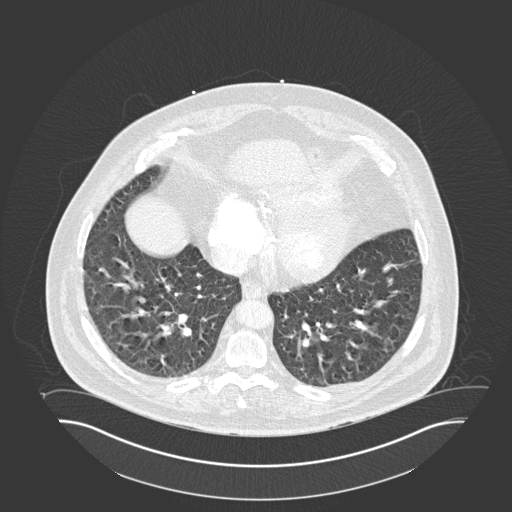
[im 122/318  mediastinal]
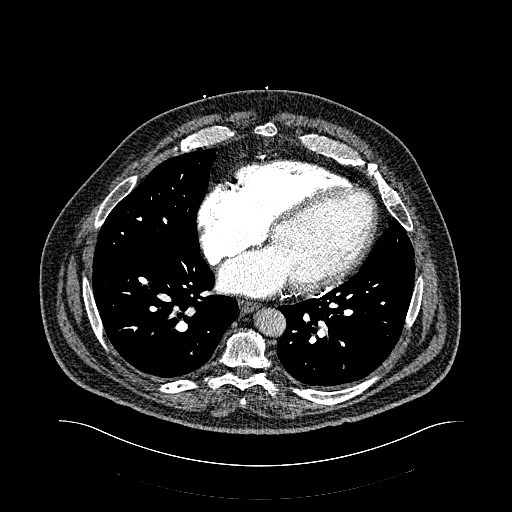
[im 122/318  lung]
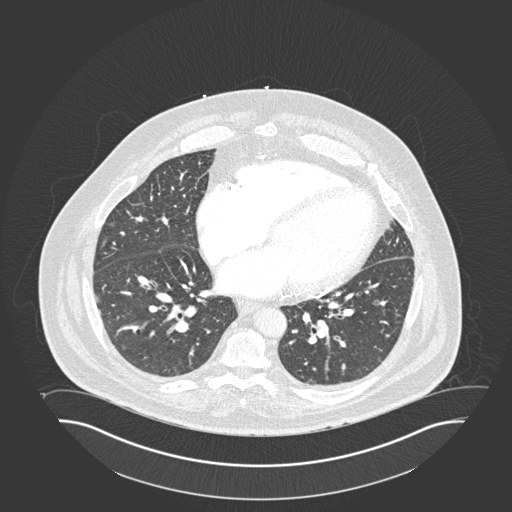
[im 147/318  lung]
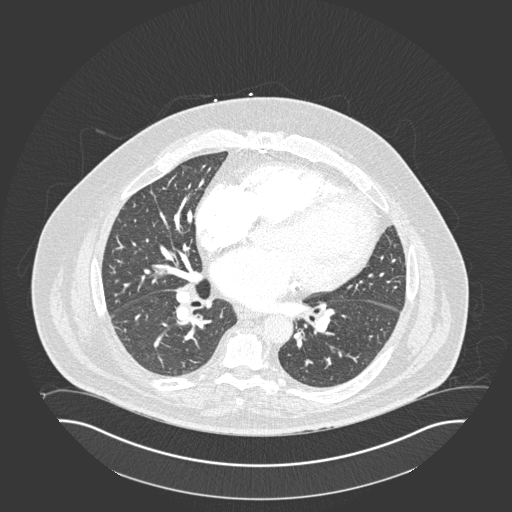
[im 171/318  lung]
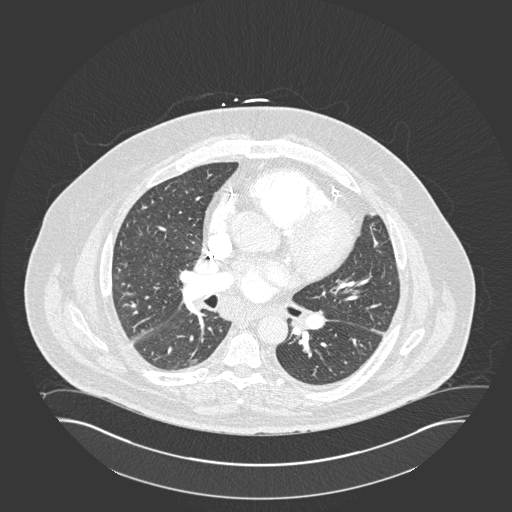
[im 196/318  lung]
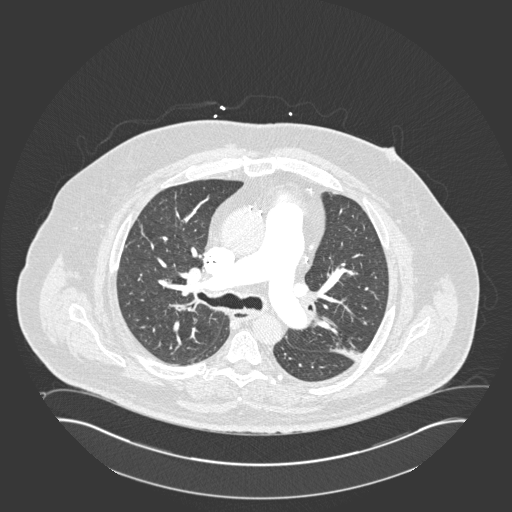
[im 220/318  mediastinal]
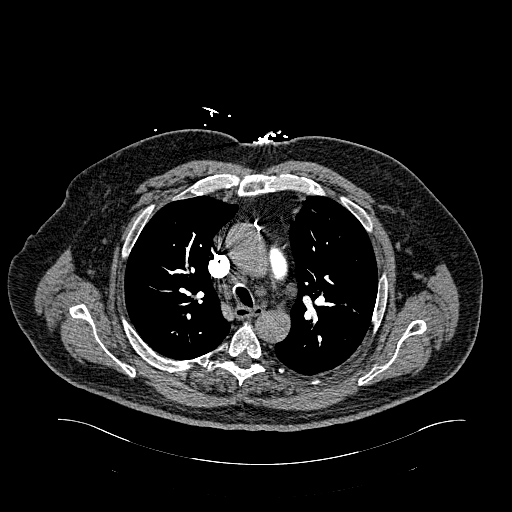
[im 220/318  lung]
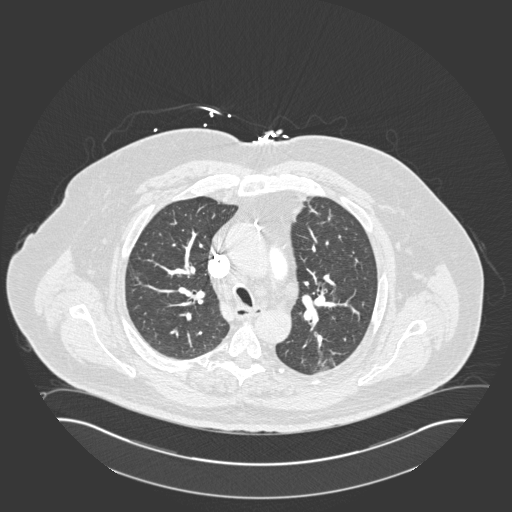
[im 244/318  lung]
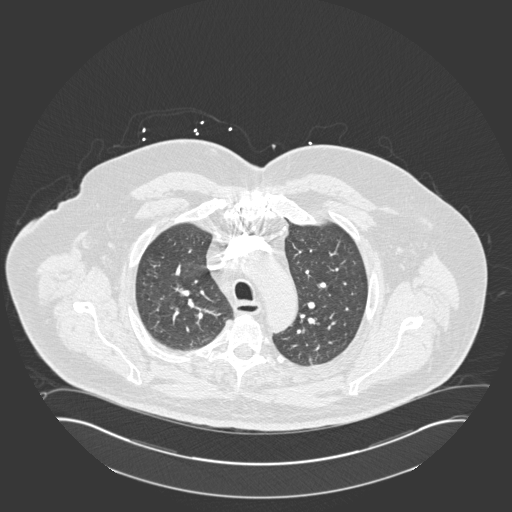
[im 269/318  lung]
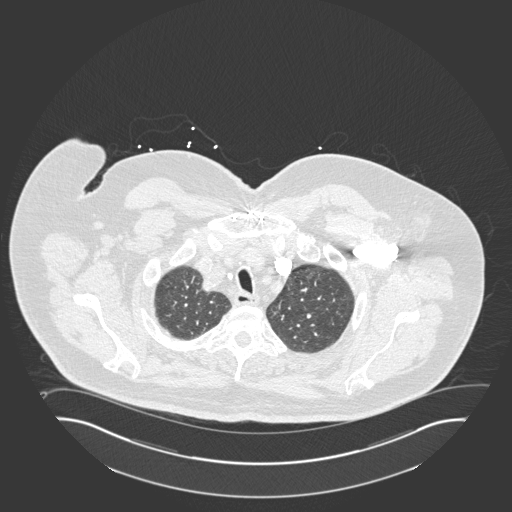
[im 293/318  lung]
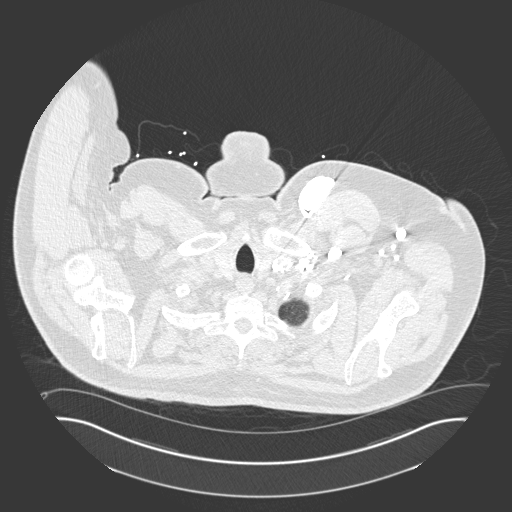

[15 of 30 positions shown; findings below may reference images not displayed]

FINDINGS: CTA CHEST FINDINGS

No filling defects in the pulmonary arterial system. No evidence of
thoracic aortic dilatation.

Widespread mediastinal adenopathy including an 18 mm pretracheal
lymph node, 25 mm subcarinal lymph node, and a 15 mm right hilar
lymph node. Other enlarged lymph nodes are also present.

Thoracic inlet and thyroid are normal. Two lead cardiac pacer noted.
No pleural or pericardial effusion.

No significant pulmonary parenchymal opacification on the right.
Minimal dependent atelectasis right lung base. Minimal dependent
atelectasis on the left. Mild discoid atelectasis posterior left
apex. Left lung otherwise clear.

No acute musculoskeletal abnormalities.

Cardiac enlargement. Contrast seen extending into attic pains.
Prominence of the cranial aspect of the inferior vena cava.

CT ABDOMEN and PELVIS FINDINGS

Diffuse hepatic steatosis. 7 mm stone in the neck of the
gallbladder. Evidence of gallbladder wall thickening and mild
pericholecystic inflammation.

Pancreas is normal.  Spleen is normal.  Adrenal glands are normal.

17 mm celiac axis lymph node.

Motion artifact causes mild limitation in evaluation of the kidneys,
but these appear otherwise normal.

Bladder is normal. Reproductive organs are normal. There is trace
free fluid in the pelvis. Nonobstructive bowel gas pattern. No focal
bowel abnormalities. Stool throughout the colon. Minimal
inflammatory change bilaterally in the perinephric retroperitoneum,
quite possibly not of acute origin.

No acute musculoskeletal abnormalities.

Review of the MIP images confirms the above findings.
IMPRESSION: Evidence of diastolic dysfunction with contrast refluxing into
hepatic veins. Cardiac enlargement.

No evidence of pulmonary embolism. No significant parenchymal
opacity to indicate pulmonary edema.

Cholelithiasis. Findings suspicious for mild gallbladder wall
thickening and possibly mild pericholecystic inflammation. Findings
concerning for cholecystitis. If indicated consider limited right
upper quadrant abdomen ultrasound.

Prominent mediastinal lymph nodes, right hilar lymph node, and
celiac axis lymph nodes. These are nonspecific findings.
Lymphadenopathy may be reactive, lymphoma tips, or metastatic.

## 2017-01-18 ENCOUNTER — Ambulatory Visit (HOSPITAL_BASED_OUTPATIENT_CLINIC_OR_DEPARTMENT_OTHER)
Admission: RE | Admit: 2017-01-18 | Discharge: 2017-01-18 | Disposition: A | Discharge status: Home / Self Care | Payer: Medicare HMO | Source: Ambulatory Visit | Attending: Internal Medicine | Admitting: Internal Medicine

## 2017-01-18 ENCOUNTER — Ambulatory Visit (HOSPITAL_COMMUNITY): Payer: Self-pay | Admitting: Pharmacist

## 2017-01-18 DIAGNOSIS — F3289 Other specified depressive episodes: Secondary | ICD-10-CM | POA: Insufficient documentation

## 2017-01-18 DIAGNOSIS — I2510 Atherosclerotic heart disease of native coronary artery without angina pectoris: Secondary | ICD-10-CM | POA: Diagnosis not present

## 2017-01-18 DIAGNOSIS — F418 Other specified anxiety disorders: Secondary | ICD-10-CM | POA: Insufficient documentation

## 2017-01-18 DIAGNOSIS — Z951 Presence of aortocoronary bypass graft: Secondary | ICD-10-CM | POA: Insufficient documentation

## 2017-01-18 DIAGNOSIS — Z79899 Other long term (current) drug therapy: Secondary | ICD-10-CM

## 2017-01-18 DIAGNOSIS — I110 Hypertensive heart disease with heart failure: Secondary | ICD-10-CM

## 2017-01-18 DIAGNOSIS — Z95811 Presence of heart assist device: Secondary | ICD-10-CM | POA: Diagnosis not present

## 2017-01-18 DIAGNOSIS — E119 Type 2 diabetes mellitus without complications: Secondary | ICD-10-CM

## 2017-01-18 DIAGNOSIS — Z7982 Long term (current) use of aspirin: Secondary | ICD-10-CM | POA: Insufficient documentation

## 2017-01-18 DIAGNOSIS — I5022 Chronic systolic (congestive) heart failure: Secondary | ICD-10-CM

## 2017-01-18 DIAGNOSIS — R0602 Shortness of breath: Secondary | ICD-10-CM | POA: Diagnosis not present

## 2017-01-18 DIAGNOSIS — Z7901 Long term (current) use of anticoagulants: Secondary | ICD-10-CM

## 2017-01-18 DIAGNOSIS — Z794 Long term (current) use of insulin: Secondary | ICD-10-CM | POA: Insufficient documentation

## 2017-01-18 DIAGNOSIS — I5023 Acute on chronic systolic (congestive) heart failure: Secondary | ICD-10-CM

## 2017-01-18 DIAGNOSIS — Z720 Tobacco use: Secondary | ICD-10-CM | POA: Insufficient documentation

## 2017-01-18 DIAGNOSIS — Z950 Presence of cardiac pacemaker: Secondary | ICD-10-CM | POA: Insufficient documentation

## 2017-01-18 DIAGNOSIS — I480 Paroxysmal atrial fibrillation: Secondary | ICD-10-CM | POA: Diagnosis not present

## 2017-01-18 LAB — CBC
HCT: 30.4 % — ABNORMAL LOW (ref 39.0–52.0)
Hemoglobin: 9.6 g/dL — ABNORMAL LOW (ref 13.0–17.0)
MCH: 23.6 pg — ABNORMAL LOW (ref 26.0–34.0)
MCHC: 31.6 g/dL (ref 30.0–36.0)
MCV: 74.9 fL — ABNORMAL LOW (ref 78.0–100.0)
PLATELETS: 199 10*3/uL (ref 150–400)
RBC: 4.06 MIL/uL — AB (ref 4.22–5.81)
RDW: 17.3 % — ABNORMAL HIGH (ref 11.5–15.5)
WBC: 6.2 10*3/uL (ref 4.0–10.5)

## 2017-01-18 LAB — BASIC METABOLIC PANEL
Anion gap: 9 (ref 5–15)
BUN: 42 mg/dL — ABNORMAL HIGH (ref 6–20)
CALCIUM: 8.7 mg/dL — AB (ref 8.9–10.3)
CHLORIDE: 81 mmol/L — AB (ref 101–111)
CO2: 30 mmol/L (ref 22–32)
CREATININE: 1.35 mg/dL — AB (ref 0.61–1.24)
GFR calc non Af Amer: 56 mL/min — ABNORMAL LOW (ref >60)
Glucose, Bld: 226 mg/dL — ABNORMAL HIGH (ref 65–99)
Potassium: 4.3 mmol/L (ref 3.5–5.1)
SODIUM: 120 mmol/L — AB (ref 135–145)

## 2017-01-18 LAB — COOXEMETRY PANEL
Carboxyhemoglobin: 2 % — ABNORMAL HIGH (ref 0.5–1.5)
Methemoglobin: 0.8 % (ref 0.0–1.5)
O2 Saturation: 98 %
TOTAL HEMOGLOBIN: 9.5 g/dL — AB (ref 12.0–16.0)

## 2017-01-18 LAB — PROTIME-INR
INR: 2.56
PROTHROMBIN TIME: 28 s — AB (ref 11.4–15.2)

## 2017-01-18 LAB — LACTATE DEHYDROGENASE: LDH: 219 U/L — AB (ref 98–192)

## 2017-01-18 NOTE — Patient Instructions (Signed)
Take metolazone 5mg  once today and tomorrow. Call with you weight tomorrow.  Return to clinic Thursday.

## 2017-01-18 NOTE — Progress Notes (Signed)
Advanced Heart Failure Clinic Note    Referring Provider: Darylene Price, FNP Primary Care: [facility] Primary Cardiologist: Dr Haroldine Laws.   HPI:  Ronald Miller. is a 61 y.o. male with history of chronic systolic HF s/p Medtronic ICD 2014, CAD s/p CABG in 2000, HTN, Hx of cocaine abuse, Tobacco abuse, Depression, and COPD.   Underwent HM 3 placement 09/09/16. Post-op course complicated by RV failure, volume overload, left pleural effusion and PAF. He was diuresed successfully. Milrinone weaned to off. Underwent ramp echo prior to d/c and speed adjusted. Also underwent left thoracentesis with IR (600 cc off) with moderate improvement in size of effusion. Warfarin loaded. INR 2.0. Co-ox on date of d/c was 52% but he was ambulating well without symptoms. PICC line pulled prior to discharge.   In 10/13/16 had repeat thoracentesis.   Hospitalized from 11/30/16-12/06/16 with volume overload, DOE and orthopnea. Weight was up 15 pounds, mixed venous sat was 50% consistent  low output. He was diuresed with IV Lasix then transitioned to torsemide 40mg  daily. Had a ramp echo on 12/01/16, and speed increased to 5900 -> 6000. The following morning his MV sat was down into 40s with frequent PI events so speed turned back down to 5900 and diuresis continued. Milrinone started. He was discharged on 0.134mcg of Milrinone. Discharge weight was 224 pounds.   He presents today for HF follow up. Says he continue to feel bad. Complaining of bloating, LE edema and progressive DOE. Weight is stable from 230 but up from baseline. Says he is watching intake. He is taking Torsemide 60/40, no metolazone. Continue to express anxiety and sadness about his health.Says he is not sleeping well. Remains on milrinone. MV sat today is 98% (inaccurate) He denies tobacco use or other substance abuse since getting VAD. Mother and father participating in his care. Denies problems with dressing site. No bleeding. No  fevers or chills.   Denies LVAD trauma. Denies driveline trauma, erythema, or drainage. No ICD shocks.   Reports taking Coumadin as prescribed and adherence to anticoagulation based dietary restrictions. Denies bright red blood per rectum or melena, no dark urine or hematuria.    VAD Indication: Destination therapy- evaluation at [facility] in process   VAD interrogation & Equipment Management: Speed:5900 Flow: 5.5 Power:2.7 w PI:4.7  Alarms: no clinical alarms Events: 10/20 events daily  Fixed speed 5900 Low speed limit: 5600 Labs (12/17): K 4.4, creatinine 0.92, LFTs normal, BNP 524, hgb 9.7, LDH 187, INR 1.75  Past Medical History:  Diagnosis Date  . AICD (automatic cardioverter/defibrillator) present   . ASCVD (arteriosclerotic cardiovascular disease)   . Chronic systolic CHF (congestive heart failure) ([place])   . COPD (chronic obstructive pulmonary disease) ([place])   . Coronary artery disease   . Depression   . Diabetes mellitus   . History of cocaine abuse   . Hypertension   . Presence of permanent cardiac pacemaker   . Shortness of breath dyspnea   . Suicidal ideation   . Tobacco abuse     Current Outpatient Prescriptions  Medication Sig Dispense Refill  . amiodarone (PACERONE) 200 MG tablet Take 1 tablet (200 mg total) by mouth daily. 90 tablet 6  . aspirin EC 81 MG EC tablet Take 1 tablet (81 mg total) by mouth daily. 30 tablet 6  . citalopram (CELEXA) 10 MG tablet Take 1 tablet (10 mg total) by mouth daily. 90 tablet 6  . digoxin (LANOXIN) 0.125 MG tablet Take 1 tablet (125  mcg total) by mouth daily. 90 tablet 3  . docusate sodium (COLACE) 100 MG capsule Take 2 capsules (200 mg total) by mouth daily. 10 capsule 0  . gabapentin (NEURONTIN) 100 MG capsule Take 2 capsules (200 mg total) by mouth 2 (two) times daily. 180 capsule 3  . insulin aspart (NOVOLOG FLEXPEN) 100 UNIT/ML FlexPen Inject 10 Units into the skin 3 (three) times daily with meals. 15 mL 11  . Insulin  Glargine (LANTUS SOLOSTAR) 100 UNIT/ML Solostar Pen Inject 20 Units into the skin daily at 10 pm.    . losartan (COZAAR) 25 MG tablet Take 1 tablet (25 mg total) by mouth daily. 30 tablet 6  . magnesium oxide (MAG-OX) 400 (241.3 Mg) MG tablet Take 1 tablet (400 mg total) by mouth daily. 30 tablet 6  . metolazone (ZAROXOLYN) 2.5 MG tablet Take 2.5 mg by mouth as directed. By Heart Failure Clinic    . milrinone (PRIMACOR) 20 MG/100 ML SOLN infusion Inject 25.175 mcg/min into the vein continuous. Per [facility] pharmacy 100 mL 6  . pantoprazole (PROTONIX) 40 MG tablet Take 1 tablet (40 mg total) by mouth daily. 30 tablet 6  . potassium chloride SA (K-DUR,KLOR-CON) 20 MEQ tablet Take 20 mEq by mouth 2 (two) times daily. Take additional 20 mEq tablet on metolazone days    . sildenafil (REVATIO) 20 MG tablet Take 1 tablet (20 mg total) by mouth 3 (three) times daily. 90 tablet 2  . spironolactone (ALDACTONE) 25 MG tablet Take 1 tablet (25 mg total) by mouth daily. 30 tablet 6  . torsemide (DEMADEX) 20 MG tablet Take 60 mgs in the morning and 40mg s in the evening. 150 tablet 12  . warfarin (COUMADIN) 5 MG tablet Take 2-3 tablets (10-15 mg total) by mouth daily. As directed based on blood checks 90 tablet 6   No current facility-administered medications for this encounter.     Codeine; Trazodone and nefazodone; Lipitor [atorvastatin]; and Tape  REVIEW OF SYSTEMS: All systems negative except as listed in HPI, PMH and Problem list.  Vital Signs:  Doppler Pressure 90                Automatc BP: 104/74 (83) HR:81  SPO2:93  %  Weight: 246 lb w/o eqt Last weight: 246 lb Home weights: 240 lbs     Wt Readings from Last 3 Encounters:  01/13/17 246 lb (111.6 kg)  01/11/17 238 lb (108 kg)  12/29/16 236 lb 12.8 oz (107.4 kg)    Physical Exam: GENERAL: Fatigued appearing NAD  HEENT: Normal NECK: Supple, JVP hard to see ~10 . Carotids 2+ bilaterally, no bruits.  No lymphadenopathy or thyromegaly  appreciated.   CARDIAC:  Mechanical heart sounds with + LVAD - normal HM-3 sounds LUNGS:  CTA. No crackles or wheeze ABDOMEN:  firm, Non tender, + distended. No bruits or masses. Good BS  LVAD exit site:  Dressing dry and intact.  No drainage or erytthema. Stabilization device present and adequately applied.  Driveline dressing is being changed daily per sterile technique. EXTREMITIES:  Warm. 1+ edema  no cyanosis, clubbing, rash. NEUROLOGIC:  Alert and oriented x 4.  Gait steady.  No aphasia.  No dysarthria.  Affect pleasant.  Anxious and depressed   ASSESSMENT AND PLAN:  1. Chronic end-stage biventricular systolic HF: LVEF 67% due to ICM -> s/p Echo 08/24/16 LVEF 15%, RV mild dilated, moderately reduced. --> S/P HM3 LVAD 89/12/8099.   - Complicated by RV failure -  Remains NYHA III  despite VAD support and milrinone at 0.25 - Volume status appears elevated. Weight up 6 pounds from discharge weight. Continue torsemide 60/40. Will add metolazone 5mg  daily for 2 days. If no response can admit for IV diuresis and RHC. - Continue sildenafil - He has f/u at [facility] tomorrow.  - I worry that depression and anxiety also playing a role in his symptoms.   will increase torsemide to 40mg  BID, he was taking 40mg  in the am and 20mg  in the pm.  - VAD parameters stable  - Warfarin with goal INR 2.0 - 2.5 + ASA 81 daily. Avoid lovenox in future due to epistaxis. INR is 2.56, pharmacy is dosing coumadin. I discussed with them today - LDH is 219, stable.  - Consider adding Entresto at some point.  - Had begun work up for Transplant via [employer]. Will need to be free from Tobacco and cocaine x 6 months before work up can continue. Will continue routine screens.Have discussed need for complete abstinence. Seems to be doing well with this.  2. CAD: No CP. 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  - No s/s of ischemia. Continue current regimen. 3. DMII:  - Hgb A1C 8.3 on  08/26/2016. No change.  4. Tobacco abuse - Says he has not smoked (or used any drugs) since prior to admission for LVAD. UDS remain negative - Will continue routine cotinine screens and UDS as part of transplant w/u 5. Atrial fibrillation, paroxysmal - NSR on amio.  - Continue Amiodarone 200 mg for now 6. Left pleural effusion - underwent repeat thoracentesis in Jan. 2018.  -  Stable L small pleural effusion on 12/01/16 with mild bibasilar atelectasis. 7.  Anxiety/Depression - Continue Celexa 10mg  daily  - I confirmed that he has f/u with a counselor soon.   Follow up in one week.   Bensimhon, Daniel,MD 6:20 PM

## 2017-01-18 NOTE — Progress Notes (Signed)
Patient presents for 1 week  follow up in [facility] today. Reports no problems with VAD equipment or concerns with drive line. Complains of fatigue, abdominal fullness, and severe anxiety. States he feels scared to be alone and is scared to die. Tearful during duration of visit today discussing his heart failure and physical debilitations.   Vital Signs:  Doppler Pressure 90   Automatc BP: 104/74 (83) HR:81   SPO2:93  %  Weight: 246 lb w/o eqt Last weight: 246 lb Home weights: 240 lbs   VAD Indication: Destination therapy- eval ongoing at [facility]  VAD interrogation & Equipment Management: Speed:5900 Flow: 5.6 Power:4.8 w    PI:3.1  Alarms: no clinical alarms Events: 5-10 PI events daily  Fixed speed 5900 Low speed limit: 5600  Primary Controller:  Replace back up battery in 65months. Back up controller:   Replace back up battery in 26 months.  Annual Equipment Maintenance on UBC/PM was performed on 09/2017.   I reviewed the LVAD parameters from today and compared the results to the patient's prior recorded data. LVAD interrogation was NEGATIVE for significant power changes, NEGATIVE for clinical alarms and STABLE for PI events/speed drops. No programming changes were made and pump is functioning within specified parameters. Pt is performing daily controller and system monitor self tests along with completing weekly and monthly maintenance for LVAD equipment.  LVAD equipment check completed and is in good working order. Back-up equipment present. Charged back up battery and performed self-test on equipment.   Exit Site Care: Drive line is being maintained weekly  by sister. Drive line exit site well healed and incorporated. The velour is fully implanted at exit site. Dressing dry and intact. No erythema or drainage. Stabilization device present and accurately applied. Pt denies fever or chills. Pt states they have adequate dressing supplies at home.    Drive line exit  wound care performed. Existing VAD dressing removed and site care performed using sterile technique. Drive line exit site cleaned with Chlora prep applicators x 2, allowed to dry, and Sorbaview dressing with bio patch re-applied. Exit site healed and incorporated, the velour is fully implanted at exit site. No redness, tenderness, drainage, foul odor or rash noted. Drive line anchor re-applied. Pt denies fever or chills.    Significant Events on VAD Support:  11/30/16> Milrinone gtt at discharge  Device:Protect Therapies: off Last check: 01/03/17  BP & Labs:  MAP 90 - Doppler is reflecting MAP  Hgb 9.6 - No S/S of bleeding. Specifically denies melena/BRBPR or nosebleeds.  LDH stable at 219 with established baseline of 180- 250. Denies tea-colored urine. No power elevations noted on interrogation.   Plan: 1. Start mental health counseling per Kennyth Lose, CSW recommendation. 2. Take metolazone 5 mg tablet today and tomorrow. 3. Call clinic with weight tomorrow. 4. Plan to possibly [place] in near future->>pre certification has been completed. 5. Return to clinic Thursday.   Balinda Quails RN [place] Coordinator   Office: [phone] 24/7 Emergency VAD Pager: [phone]

## 2017-01-19 ENCOUNTER — Encounter (HOSPITAL_COMMUNITY): Payer: Medicare HMO

## 2017-01-19 ENCOUNTER — Telehealth (HOSPITAL_COMMUNITY): Payer: Self-pay | Admitting: Surgery

## 2017-01-19 ENCOUNTER — Encounter: Payer: Self-pay | Admitting: *Deleted

## 2017-01-19 ENCOUNTER — Telehealth: Payer: Self-pay | Admitting: *Deleted

## 2017-01-19 DIAGNOSIS — Z95811 Presence of heart assist device: Secondary | ICD-10-CM

## 2017-01-19 DIAGNOSIS — I5022 Chronic systolic (congestive) heart failure: Secondary | ICD-10-CM

## 2017-01-19 NOTE — Telephone Encounter (Signed)
Called to check on Ronald Miller's status for rehab.  Left message on voicemail.

## 2017-01-19 NOTE — Telephone Encounter (Signed)
I called to check on Ronald Miller after recent discussion with him during LVAD Support group.  He voiced at that time that he is very "scared" due to the fact that he was not feeling significantly better-- still complains that he is "SOB" and belly is "very tight".  He does say that coming to the support group is helpful and talking with other VAD patients is uplifting.  I encouraged him that our team is continuing to do all we know to do to help him feel better.  I also let him know that Raquel Sarna LCSW and I are available most days for encouragement if needed.    He requests that I order him a LVAD t-shirt for the walk/ VADiversary if possible.

## 2017-01-20 ENCOUNTER — Encounter: Payer: Self-pay | Admitting: *Deleted

## 2017-01-20 ENCOUNTER — Telehealth: Payer: Self-pay | Admitting: *Deleted

## 2017-01-20 ENCOUNTER — Ambulatory Visit (HOSPITAL_BASED_OUTPATIENT_CLINIC_OR_DEPARTMENT_OTHER)
Admission: RE | Admit: 2017-01-20 | Discharge: 2017-01-20 | Disposition: A | Discharge status: Home / Self Care | Payer: Medicare HMO | Source: Ambulatory Visit | Attending: Internal Medicine | Admitting: Internal Medicine

## 2017-01-20 DIAGNOSIS — I5022 Chronic systolic (congestive) heart failure: Secondary | ICD-10-CM

## 2017-01-20 DIAGNOSIS — Z951 Presence of aortocoronary bypass graft: Secondary | ICD-10-CM | POA: Insufficient documentation

## 2017-01-20 DIAGNOSIS — I2510 Atherosclerotic heart disease of native coronary artery without angina pectoris: Secondary | ICD-10-CM

## 2017-01-20 DIAGNOSIS — Z7982 Long term (current) use of aspirin: Secondary | ICD-10-CM

## 2017-01-20 DIAGNOSIS — I110 Hypertensive heart disease with heart failure: Secondary | ICD-10-CM | POA: Insufficient documentation

## 2017-01-20 DIAGNOSIS — I5082 Biventricular heart failure: Secondary | ICD-10-CM | POA: Insufficient documentation

## 2017-01-20 DIAGNOSIS — I509 Heart failure, unspecified: Secondary | ICD-10-CM

## 2017-01-20 DIAGNOSIS — Z95811 Presence of heart assist device: Secondary | ICD-10-CM

## 2017-01-20 DIAGNOSIS — I480 Paroxysmal atrial fibrillation: Secondary | ICD-10-CM

## 2017-01-20 DIAGNOSIS — I50810 Right heart failure, unspecified: Secondary | ICD-10-CM

## 2017-01-20 DIAGNOSIS — Z87891 Personal history of nicotine dependence: Secondary | ICD-10-CM | POA: Insufficient documentation

## 2017-01-20 DIAGNOSIS — E119 Type 2 diabetes mellitus without complications: Secondary | ICD-10-CM

## 2017-01-20 DIAGNOSIS — Z79899 Other long term (current) drug therapy: Secondary | ICD-10-CM | POA: Diagnosis not present

## 2017-01-20 DIAGNOSIS — Z794 Long term (current) use of insulin: Secondary | ICD-10-CM

## 2017-01-20 LAB — COOXEMETRY PANEL
CARBOXYHEMOGLOBIN: 2 % — AB (ref 0.5–1.5)
METHEMOGLOBIN: 0.8 % (ref 0.0–1.5)
O2 Saturation: 74.4 %
Total hemoglobin: 9.3 g/dL — ABNORMAL LOW (ref 12.0–16.0)

## 2017-01-20 MED ORDER — METOLAZONE 2.5 MG PO TABS: 5.0000 mg | ORAL_TABLET | ORAL | 6 refills | Status: DC

## 2017-01-20 MED ORDER — SENNOSIDES-DOCUSATE SODIUM 8.6-50 MG PO TABS: 2.0000 | ORAL_TABLET | Freq: Every day | ORAL | 6 refills | Status: DC

## 2017-01-20 NOTE — Telephone Encounter (Signed)
Returning his call.  He has clearance to return to rehab.  He is having anxiety attacks and back pain.  He is going to to [place] to return on Tuesday!

## 2017-01-20 NOTE — Progress Notes (Signed)
Patient presents for 2 day  follow up in [facility] today. Reports no problems with VAD equipment or concerns with drive line.  Vital Signs:  Doppler Pressure 91   Automatc BP: 91/59 (75) HR:85   SPO2:92  %  Weight: 239 lb w/o eqt Last weight: 246 lb   VAD Indication: Destination therapy- eval ongoing at [facility]  VAD interrogation & Equipment Management: Speed:5900 Flow: 5.6 Power:4.7 w    PI:3.3  Alarms: no clinical alarms Events: 3-5 PI events daily  Fixed speed 5900 Low speed limit: 5600  Primary Controller:  Replace back up battery in 36months. Back up controller:   Replace back up battery in 26 months.  Annual Equipment Maintenance on UBC/PM was performed on 09/2016.   I reviewed the LVAD parameters from today and compared the results to the patient's prior recorded data. LVAD interrogation was NEGATIVE for significant power changes, NEGATIVE for clinical alarms and STABLE for PI events/speed drops. No programming changes were made and pump is functioning within specified parameters. Pt is performing daily controller and system monitor self tests along with completing weekly and monthly maintenance for LVAD equipment.  LVAD equipment check completed and is in good working order. Back-up equipment present. Charged back up battery and performed self-test on equipment.   Exit Site Care: Drive line is being maintained weekly  by sister. Drive line exit site well healed and incorporated. The velour is fully implanted at exit site. Dressing dry and intact. No erythema or drainage. Stabilization device present and accurately applied. Pt denies fever or chills. Pt states they have adequate dressing supplies at home.   Significant Events on VAD Support:  11/30/16> Milrinone gtt at discharge  Device:Protect Therapies: off Last check: 01/03/17   BP & Labs:  MAP 80 - Doppler is reflecting MAP  Plan: 1. Start taking Metolazone 5 mg every Monday, Wednesday, and  Friday 2. Return to clinic next week for co-ox and additional labs 3. Check when therapist appointment is scheduled this month. 4. Return to cardiac rehab. 5. Sleep study scheduled for May 9th  Balinda Quails RN [place] Coordinator   Office: [phone] 24/7 Emergency VAD Pager: [phone]

## 2017-01-20 NOTE — Patient Instructions (Signed)
Start taking Metolazone 5 mg every Monday Wednesday and Friday  Return to clinic next week for labs and dressing change

## 2017-01-20 NOTE — Progress Notes (Addendum)
CSW met with patient in the clinic to provide supportive intervention. Patient reports he went to [place] yesterday and is close to be listed. Patient shared feelings of frustration and hoped to be feeling better by now. CSW discussed recent improvements and discussed retrospective review of his journey to VAD. Patient shared he was so excited to get his own apartment and now experiencing feelings of loneliness. Patient stated his sister has said multiple times "free to visit anytime". CSW encouraged patient to take his sister up on a visit. Patient reports in the past he would cook dinner for his sister and she recently made mention. CSW provided patient with a [employer] gift card to encourage him to make dinner for the family and help motivate him to reach out and begin socializing again. Patient appears to have feelings of depressed mood and has scheduled appointment for 01/28/17 with a counselor. Patient grateful for supportive intervention and encouragement to re engage with family and life. CSW continues to follow for supportive needs and will follow up with patient at next clinic visit. Jackie , LCSW, CCSW-MCS [phone]   

## 2017-01-21 ENCOUNTER — Encounter (HOSPITAL_COMMUNITY): Payer: Self-pay | Admitting: Neurology

## 2017-01-21 ENCOUNTER — Inpatient Hospital Stay (HOSPITAL_COMMUNITY): Payer: Medicare HMO

## 2017-01-21 ENCOUNTER — Inpatient Hospital Stay (HOSPITAL_COMMUNITY)
Admission: EM | Admit: 2017-01-21 | Discharge: 2017-01-28 | DRG: 292 | Disposition: A | Discharge status: Home Health | Payer: Medicare HMO | Attending: Internal Medicine | Admitting: Internal Medicine

## 2017-01-21 DIAGNOSIS — R0600 Dyspnea, unspecified: Secondary | ICD-10-CM

## 2017-01-21 DIAGNOSIS — F419 Anxiety disorder, unspecified: Secondary | ICD-10-CM | POA: Diagnosis present

## 2017-01-21 DIAGNOSIS — I110 Hypertensive heart disease with heart failure: Secondary | ICD-10-CM | POA: Diagnosis present

## 2017-01-21 DIAGNOSIS — Z95811 Presence of heart assist device: Secondary | ICD-10-CM

## 2017-01-21 DIAGNOSIS — F329 Major depressive disorder, single episode, unspecified: Secondary | ICD-10-CM | POA: Diagnosis present

## 2017-01-21 DIAGNOSIS — Z95810 Presence of automatic (implantable) cardiac defibrillator: Secondary | ICD-10-CM | POA: Diagnosis not present

## 2017-01-21 DIAGNOSIS — J449 Chronic obstructive pulmonary disease, unspecified: Secondary | ICD-10-CM | POA: Diagnosis present

## 2017-01-21 DIAGNOSIS — E041 Nontoxic single thyroid nodule: Secondary | ICD-10-CM | POA: Diagnosis present

## 2017-01-21 DIAGNOSIS — I82621 Acute embolism and thrombosis of deep veins of right upper extremity: Secondary | ICD-10-CM | POA: Diagnosis present

## 2017-01-21 DIAGNOSIS — F1411 Cocaine abuse, in remission: Secondary | ICD-10-CM | POA: Diagnosis present

## 2017-01-21 DIAGNOSIS — R112 Nausea with vomiting, unspecified: Secondary | ICD-10-CM | POA: Diagnosis present

## 2017-01-21 DIAGNOSIS — Z833 Family history of diabetes mellitus: Secondary | ICD-10-CM

## 2017-01-21 DIAGNOSIS — R519 Headache, unspecified: Secondary | ICD-10-CM

## 2017-01-21 DIAGNOSIS — Z8249 Family history of ischemic heart disease and other diseases of the circulatory system: Secondary | ICD-10-CM | POA: Diagnosis not present

## 2017-01-21 DIAGNOSIS — E119 Type 2 diabetes mellitus without complications: Secondary | ICD-10-CM | POA: Diagnosis present

## 2017-01-21 DIAGNOSIS — I2510 Atherosclerotic heart disease of native coronary artery without angina pectoris: Secondary | ICD-10-CM | POA: Diagnosis present

## 2017-01-21 DIAGNOSIS — I5082 Biventricular heart failure: Secondary | ICD-10-CM | POA: Diagnosis present

## 2017-01-21 DIAGNOSIS — I5020 Unspecified systolic (congestive) heart failure: Secondary | ICD-10-CM

## 2017-01-21 DIAGNOSIS — Z87891 Personal history of nicotine dependence: Secondary | ICD-10-CM

## 2017-01-21 DIAGNOSIS — F5105 Insomnia due to other mental disorder: Secondary | ICD-10-CM | POA: Diagnosis present

## 2017-01-21 DIAGNOSIS — E871 Hypo-osmolality and hyponatremia: Secondary | ICD-10-CM | POA: Diagnosis present

## 2017-01-21 DIAGNOSIS — R51 Headache: Secondary | ICD-10-CM

## 2017-01-21 DIAGNOSIS — Z7901 Long term (current) use of anticoagulants: Secondary | ICD-10-CM

## 2017-01-21 DIAGNOSIS — I480 Paroxysmal atrial fibrillation: Secondary | ICD-10-CM | POA: Diagnosis present

## 2017-01-21 DIAGNOSIS — Z794 Long term (current) use of insulin: Secondary | ICD-10-CM

## 2017-01-21 DIAGNOSIS — I5023 Acute on chronic systolic (congestive) heart failure: Secondary | ICD-10-CM | POA: Diagnosis present

## 2017-01-21 DIAGNOSIS — R531 Weakness: Secondary | ICD-10-CM

## 2017-01-21 DIAGNOSIS — M79609 Pain in unspecified limb: Secondary | ICD-10-CM | POA: Diagnosis not present

## 2017-01-21 DIAGNOSIS — R0602 Shortness of breath: Secondary | ICD-10-CM | POA: Diagnosis present

## 2017-01-21 DIAGNOSIS — R1110 Vomiting, unspecified: Secondary | ICD-10-CM

## 2017-01-21 DIAGNOSIS — Z951 Presence of aortocoronary bypass graft: Secondary | ICD-10-CM | POA: Diagnosis not present

## 2017-01-21 DIAGNOSIS — I5084 End stage heart failure: Secondary | ICD-10-CM | POA: Diagnosis present

## 2017-01-21 LAB — COMPREHENSIVE METABOLIC PANEL
ALT: 26 U/L (ref 17–63)
AST: 34 U/L (ref 15–41)
Albumin: 3.8 g/dL (ref 3.5–5.0)
Alkaline Phosphatase: 86 U/L (ref 38–126)
Anion gap: 12 (ref 5–15)
BILIRUBIN TOTAL: 0.9 mg/dL (ref 0.3–1.2)
BUN: 29 mg/dL — AB (ref 6–20)
CHLORIDE: 77 mmol/L — AB (ref 101–111)
CO2: 30 mmol/L (ref 22–32)
CREATININE: 1.13 mg/dL (ref 0.61–1.24)
Calcium: 8.7 mg/dL — ABNORMAL LOW (ref 8.9–10.3)
GFR calc Af Amer: >60 mL/min (ref >60)
GLUCOSE: 212 mg/dL — AB (ref 65–99)
Potassium: 3.5 mmol/L (ref 3.5–5.1)
Sodium: 119 mmol/L — CL (ref 135–145)
TOTAL PROTEIN: 7.4 g/dL (ref 6.5–8.1)

## 2017-01-21 LAB — COOXEMETRY PANEL
CARBOXYHEMOGLOBIN: 2 % — AB (ref 0.5–1.5)
Carboxyhemoglobin: 1.4 % (ref 0.5–1.5)
METHEMOGLOBIN: 1.2 % (ref 0.0–1.5)
Methemoglobin: 1.2 % (ref 0.0–1.5)
O2 Saturation: 62 %
O2 Saturation: 52.3 %
TOTAL HEMOGLOBIN: 9.6 g/dL — AB (ref 12.0–16.0)
Total hemoglobin: 9.6 g/dL — ABNORMAL LOW (ref 12.0–16.0)

## 2017-01-21 LAB — BRAIN NATRIURETIC PEPTIDE: B Natriuretic Peptide: 649.9 pg/mL — ABNORMAL HIGH (ref 0.0–100.0)

## 2017-01-21 LAB — CBC
HCT: 28.6 % — ABNORMAL LOW (ref 39.0–52.0)
Hemoglobin: 9.1 g/dL — ABNORMAL LOW (ref 13.0–17.0)
MCH: 23.3 pg — ABNORMAL LOW (ref 26.0–34.0)
MCHC: 31.8 g/dL (ref 30.0–36.0)
MCV: 73.1 fL — AB (ref 78.0–100.0)
Platelets: 226 10*3/uL (ref 150–400)
RBC: 3.91 MIL/uL — AB (ref 4.22–5.81)
RDW: 16.8 % — ABNORMAL HIGH (ref 11.5–15.5)
WBC: 7.3 10*3/uL (ref 4.0–10.5)

## 2017-01-21 LAB — SEDIMENTATION RATE: SED RATE: 39 mm/h — AB (ref 0–16)

## 2017-01-21 LAB — PROTIME-INR
INR: 2.22
Prothrombin Time: 25 seconds — ABNORMAL HIGH (ref 11.4–15.2)

## 2017-01-21 LAB — GLUCOSE, CAPILLARY: GLUCOSE-CAPILLARY: 266 mg/dL — AB (ref 65–99)

## 2017-01-21 LAB — LACTATE DEHYDROGENASE: LDH: 216 U/L — AB (ref 98–192)

## 2017-01-21 LAB — MRSA PCR SCREENING: MRSA BY PCR: NEGATIVE

## 2017-01-21 MED ORDER — GABAPENTIN 100 MG PO CAPS
200.0000 mg | ORAL_CAPSULE | Freq: Two times a day (BID) | ORAL | Status: DC
  Administered 2017-01-21 – 2017-01-28 (×14): 200 mg via ORAL
  Filled 2017-01-21 (×14): qty 2

## 2017-01-21 MED ORDER — POTASSIUM CHLORIDE CRYS ER 20 MEQ PO TBCR: 20.0000 meq | EXTENDED_RELEASE_TABLET | Freq: Two times a day (BID) | ORAL | Status: DC

## 2017-01-21 MED ORDER — LOSARTAN POTASSIUM 25 MG PO TABS
25.0000 mg | ORAL_TABLET | Freq: Every day | ORAL | Status: DC
  Administered 2017-01-22 – 2017-01-28 (×7): 25 mg via ORAL
  Filled 2017-01-21 (×7): qty 1

## 2017-01-21 MED ORDER — METOLAZONE 5 MG PO TABS: 5.0000 mg | ORAL_TABLET | ORAL | Status: DC

## 2017-01-21 MED ORDER — SILDENAFIL CITRATE 20 MG PO TABS
20.0000 mg | ORAL_TABLET | Freq: Three times a day (TID) | ORAL | Status: DC
  Administered 2017-01-21 – 2017-01-28 (×21): 20 mg via ORAL
  Filled 2017-01-21 (×21): qty 1

## 2017-01-21 MED ORDER — TORSEMIDE 20 MG PO TABS: 60.0000 mg | ORAL_TABLET | Freq: Two times a day (BID) | ORAL | Status: DC

## 2017-01-21 MED ORDER — DOCUSATE SODIUM 100 MG PO CAPS
200.0000 mg | ORAL_CAPSULE | Freq: Every day | ORAL | Status: DC
  Administered 2017-01-22 – 2017-01-28 (×7): 200 mg via ORAL
  Filled 2017-01-21 (×8): qty 2

## 2017-01-21 MED ORDER — TRAMADOL HCL 50 MG PO TABS
50.0000 mg | ORAL_TABLET | Freq: Four times a day (QID) | ORAL | Status: DC | PRN
  Administered 2017-01-21 – 2017-01-27 (×9): 50 mg via ORAL
  Filled 2017-01-21 (×9): qty 1

## 2017-01-21 MED ORDER — ASPIRIN EC 81 MG PO TBEC
81.0000 mg | DELAYED_RELEASE_TABLET | Freq: Every day | ORAL | Status: DC
  Administered 2017-01-22 – 2017-01-28 (×7): 81 mg via ORAL
  Filled 2017-01-21 (×9): qty 1

## 2017-01-21 MED ORDER — INSULIN GLARGINE 100 UNIT/ML ~~LOC~~ SOLN
20.0000 [IU] | Freq: Every day | SUBCUTANEOUS | Status: DC
  Administered 2017-01-22 – 2017-01-28 (×7): 20 [IU] via SUBCUTANEOUS
  Filled 2017-01-21 (×7): qty 0.2

## 2017-01-21 MED ORDER — LORAZEPAM 0.5 MG PO TABS
0.5000 mg | ORAL_TABLET | Freq: Four times a day (QID) | ORAL | Status: DC | PRN
  Administered 2017-01-22 – 2017-01-27 (×4): 0.5 mg via ORAL
  Filled 2017-01-21 (×4): qty 1

## 2017-01-21 MED ORDER — MAGNESIUM HYDROXIDE 400 MG/5ML PO SUSP
30.0000 mL | Freq: Every day | ORAL | Status: DC | PRN
  Administered 2017-01-25: 30 mL via ORAL
  Filled 2017-01-21 (×2): qty 30

## 2017-01-21 MED ORDER — MAGNESIUM OXIDE 400 (241.3 MG) MG PO TABS
400.0000 mg | ORAL_TABLET | Freq: Every day | ORAL | Status: DC
  Administered 2017-01-22 – 2017-01-28 (×7): 400 mg via ORAL
  Filled 2017-01-21 (×7): qty 1

## 2017-01-21 MED ORDER — INSULIN GLARGINE 100 UNIT/ML SOLOSTAR PEN: 20.0000 [IU] | PEN_INJECTOR | Freq: Every day | SUBCUTANEOUS | Status: DC

## 2017-01-21 MED ORDER — WARFARIN - PHARMACIST DOSING INPATIENT
Freq: Every day | Status: DC
  Administered 2017-01-21 – 2017-01-26 (×3)
  Administered 2017-01-28: 5

## 2017-01-21 MED ORDER — WARFARIN SODIUM 7.5 MG PO TABS: 15.0000 mg | ORAL_TABLET | Freq: Once | ORAL | Status: DC

## 2017-01-21 MED ORDER — CITALOPRAM HYDROBROMIDE 20 MG PO TABS
10.0000 mg | ORAL_TABLET | Freq: Every day | ORAL | Status: DC
Start: 2017-01-22 — End: 2017-01-28
  Administered 2017-01-22 – 2017-01-28 (×7): 10 mg via ORAL
  Filled 2017-01-21 (×7): qty 1

## 2017-01-21 MED ORDER — SENNOSIDES-DOCUSATE SODIUM 8.6-50 MG PO TABS
2.0000 | ORAL_TABLET | Freq: Every day | ORAL | Status: DC
  Administered 2017-01-21 – 2017-01-27 (×6): 2 via ORAL
  Filled 2017-01-21 (×6): qty 2

## 2017-01-21 MED ORDER — DIGOXIN 125 MCG PO TABS
125.0000 ug | ORAL_TABLET | Freq: Every day | ORAL | Status: DC
  Administered 2017-01-22 – 2017-01-28 (×7): 125 ug via ORAL
  Filled 2017-01-21 (×7): qty 1

## 2017-01-21 MED ORDER — INSULIN ASPART 100 UNIT/ML ~~LOC~~ SOLN
0.0000 [IU] | Freq: Three times a day (TID) | SUBCUTANEOUS | Status: DC
  Administered 2017-01-21: 5 [IU] via SUBCUTANEOUS
  Administered 2017-01-22: 4 [IU] via SUBCUTANEOUS
  Administered 2017-01-22: 11 [IU] via SUBCUTANEOUS
  Administered 2017-01-22: 4 [IU] via SUBCUTANEOUS
  Administered 2017-01-23: 3 [IU] via SUBCUTANEOUS
  Administered 2017-01-23: 11 [IU] via SUBCUTANEOUS
  Administered 2017-01-23: 3 [IU] via SUBCUTANEOUS
  Administered 2017-01-24: 7 [IU] via SUBCUTANEOUS
  Administered 2017-01-24: 4 [IU] via SUBCUTANEOUS
  Administered 2017-01-25: 11 [IU] via SUBCUTANEOUS
  Administered 2017-01-25: 4 [IU] via SUBCUTANEOUS
  Administered 2017-01-25: 11 [IU] via SUBCUTANEOUS
  Administered 2017-01-26: 4 [IU] via SUBCUTANEOUS
  Administered 2017-01-26: 3 [IU] via SUBCUTANEOUS
  Administered 2017-01-26: 4 [IU] via SUBCUTANEOUS
  Administered 2017-01-27: 3 [IU] via SUBCUTANEOUS
  Administered 2017-01-27: 4 [IU] via SUBCUTANEOUS
  Administered 2017-01-28: 7 [IU] via SUBCUTANEOUS

## 2017-01-21 MED ORDER — MILRINONE LACTATE IN DEXTROSE 20-5 MG/100ML-% IV SOLN
0.3750 ug/kg/min | INTRAVENOUS | Status: DC
  Administered 2017-01-21 – 2017-01-22 (×2): 0.25 ug/kg/min via INTRAVENOUS
  Filled 2017-01-21 (×2): qty 100

## 2017-01-21 MED ORDER — ONDANSETRON HCL 4 MG/2ML IJ SOLN
4.0000 mg | Freq: Four times a day (QID) | INTRAMUSCULAR | Status: DC | PRN
  Administered 2017-01-21 – 2017-01-28 (×6): 4 mg via INTRAVENOUS
  Filled 2017-01-21 (×6): qty 2

## 2017-01-21 MED ORDER — PANTOPRAZOLE SODIUM 40 MG PO TBEC
40.0000 mg | DELAYED_RELEASE_TABLET | Freq: Every day | ORAL | Status: DC
  Administered 2017-01-22 – 2017-01-28 (×7): 40 mg via ORAL
  Filled 2017-01-21 (×8): qty 1

## 2017-01-21 MED ORDER — SPIRONOLACTONE 25 MG PO TABS
25.0000 mg | ORAL_TABLET | Freq: Every day | ORAL | Status: DC
  Administered 2017-01-22 – 2017-01-28 (×7): 25 mg via ORAL
  Filled 2017-01-21 (×7): qty 1

## 2017-01-21 MED ORDER — ACETAMINOPHEN 325 MG PO TABS
650.0000 mg | ORAL_TABLET | ORAL | Status: DC | PRN
  Administered 2017-01-22: 650 mg via ORAL
  Filled 2017-01-21: qty 2

## 2017-01-21 NOTE — ED Triage Notes (Signed)
Per ems- Pt comes from home where he lives alone, has LVAD and left sided PICC receiving milrinone. c/o worsening SOB, n/v, abdominal pain x 1 week. Vomiting x 3 today, BM this morning, was normal. BP 115/70, 95% RA. Gave 4 mg zofran through picc.

## 2017-01-21 NOTE — Care Management Note (Signed)
Case Management Note  Patient Details  Name: Ronald Miller. MRN: [id number] Date of Birth: 1956-09-09  Subjective/Objective:     Pt admitted with HF - LVAD installed in 2017               Action/Plan:   PTA independent from home active with [facility] for IV milrinone.  LVAD SW closely following    Expected Discharge Date:                  Expected Discharge Plan:  [place]  In-House Referral:  Clinical Social Work  Discharge planning Services  CM Consult  Post Acute Care Choice:    Choice offered to:     DME Arranged:    DME Agency:     HH Arranged:    HH Agency:     Status of Service:     If discussed at [employer], dates discussed:    Additional Comments:  Maryclare Labrador, RN 01/21/2017, 3:02 PM

## 2017-01-21 NOTE — Progress Notes (Signed)
ANTICOAGULATION CONSULT NOTE - Initial Consult  Pharmacy Consult for Coumadin Indication: LVAD  Allergies  Allergen Reactions  . Codeine Nausea And Vomiting  . Trazodone And Nefazodone Other (See Comments)    Dizziness with subsequent fall   . Lipitor [Atorvastatin] Nausea Only    Nausea with high doses, tolerates 20mg  dose (08/22/16)  . Tape Itching    Patient Measurements: Height: 5\' 9"  (175.3 cm) Weight: 230 lb 4.8 oz (104.5 kg) IBW/kg (Calculated) : 70.7  Vital Signs: Temp: 98.3 F (36.8 C) (04/20 1333) Temp Source: Oral (04/20 1333) BP: 85/74 (04/20 1333) Pulse Rate: 83 (04/20 1240)  Labs:  Recent Labs  01/21/17 1230 01/21/17 1421  HGB 9.1*  --   HCT 28.6*  --   PLT 226  --   LABPROT  --  25.0*  INR  --  2.22  CREATININE 1.13  --     Estimated Creatinine Clearance: 82.8 mL/min (by C-G formula based on SCr of 1.13 mg/dL).   Medical History: Past Medical History:  Diagnosis Date  . AICD (automatic cardioverter/defibrillator) present   . ASCVD (arteriosclerotic cardiovascular disease)   . Chronic systolic CHF (congestive heart failure) ([place])   . COPD (chronic obstructive pulmonary disease) ([place])   . Coronary artery disease   . Depression   . Diabetes mellitus   . History of cocaine abuse   . Hypertension   . Presence of permanent cardiac pacemaker   . Shortness of breath dyspnea   . Suicidal ideation   . Tobacco abuse     Assessment: 61yom s/p LVAD on coumadin. INR therapeutic on admission at 2.22. CBC, LDH stable.  Home dose: 15mg  daily except 10mg  on Tuesday  Goal of Therapy:  2-2.5 Monitor platelets by anticoagulation protocol: Yes   Plan:  1) Coumadin 15mg  x 1 2) Daily INR  Deboraha Sprang 01/21/2017,3:08 PM

## 2017-01-21 NOTE — Progress Notes (Signed)
CRITICAL VALUE ALERT  Critical value received: Na+ 119  Date of notification:  01/21/2017  Time of notification:  1350  Critical value read back: yes  Nurse who received alert:  Fabienne Bruns, RN  MD notified (1st page):  Darrick Grinder, NP  Time of first page:  1352  MD notified (2nd page):  Time of second page:  Responding MD:  Darrick Grinder, NP  Time MD responded:  [phone]

## 2017-01-21 NOTE — Progress Notes (Signed)
[facility]   Active pt with [facility] for [facility] for home Milrinone.  [facility] team will follow pt while in the hospital to support transition home.  If patient discharges after hours, please call [phone].   [name] 01/21/2017, 1:19 PM

## 2017-01-21 NOTE — H&P (Signed)
Advanced Heart Failure VAD History and Physical Note   Reason for Admission: Volume overload, nausea and vomiting.    HPI:    Ronald Miller is a 60 year old male with a past medical history of chronic systolic HF s/p Medtronic ICD in 2014, CAD s/p CABG in 2000, HTN, history of cocaine abuse, tobacco abuse, depression, and COPD. He underwent HM 3 placement on 09/09/16. His post operative course was complicated by RV failure, volume overload, L pleural effusion and PAF.   Hospitalized from 11/30/16-12/06/16 with volume overload, DOE and orthopnea. Weight was up 15 pounds, mixed venous sat was 50% consistent  low output. He was diuresed with IV Lasix then transitioned to torsemide 40mg  daily. Had a ramp echo on 12/01/16, and speed increased to 5900 ->6000. The following morning his MV sat was down into 40s with frequent PIevents so speed turned back down to 5900 and diuresis continued.Milrinone started. He was discharged on 0.173mcg of Milrinone. Discharge weight was 224 pounds.   He was seen in the clinic on 01/18/17. His weight was 246 pounds. He was given 5mg  of metolazone to be taken the following 2 days.   He presented to the ED on 01/21/17 with nausea, vomiting and volume overload. He has had issues with nausea and vomiting for a few weeks. His weight was 246 pounds at admission. His home weight was 234 pounds. Complaining of abdominal bloating. Denies chest pain, feels somewhat SOB. Denies syncope and presyncope.    LVAD INTERROGATION:  HeartMate II LVAD:  Flow 5.5 liters/min, speed 5900, power 4.7, PI 3.1 >20PI events on 01/21/17.    Review of Systems: [y] = yes, [ ]  = no   General: Weight gain Blue.Ronald Miller ]; Weight loss [ ] ; Anorexia [ ] ; Fatigue Blue.Ronald Miller ]; Fever [ ] ; Chills [ ] ; Weakness [ ]   Cardiac: Chest pain/pressure [ ] ; Resting SOB [ ] ; Exertional SOB Blue.Ronald Miller ]; Orthopnea [ ] ; Pedal Edema [ ] ; Palpitations [ ] ; Syncope [ ] ; Presyncope [ ] ; Paroxysmal nocturnal dyspnea[ ]   Pulmonary: Cough [ ] ;  Wheezing[ ] ; Hemoptysis[ ] ; Sputum [ ] ; Snoring [ ]   GI: Vomiting[ y]; Dysphagia[ ] ; Melena[ ] ; Hematochezia [ ] ; Heartburn[ ] ; Abdominal pain [ ] ; Constipation [ ] ; Diarrhea [ ] ; BRBPR [ ]   GU: Hematuria[ ] ; Dysuria [ ] ; Nocturia[ ]   Vascular: Pain in legs with walking [ ] ; Pain in feet with lying flat [ ] ; Non-healing sores [ ] ; Stroke [ ] ; TIA [ ] ; Slurred speech [ ] ;  Neuro: Headaches[ ] ; Vertigo[ ] ; Seizures[ ] ; Paresthesias[ ] ;Blurred vision [ ] ; Diplopia [ ] ; Vision changes [ ]   Ortho/Skin: Arthritis [ ] ; Joint pain [ ] ; Muscle pain [ ] ; Joint swelling [ ] ; Back Pain [ ] ; Rash [ ]   Psych: Depression[ ] ; Anxiety[ ]   Heme: Bleeding problems [ ] ; Clotting disorders [ ] ; Anemia [ ]   Endocrine: Diabetes [ ] ; Thyroid dysfunction[ ]     Home Medications Prior to Admission medications   Medication Sig Start Date End Date Taking? Authorizing Provider  amiodarone (PACERONE) 200 MG tablet Take 1 tablet (200 mg total) by mouth daily. 01/12/17   Jolaine Artist, MD  aspirin EC 81 MG EC tablet Take 1 tablet (81 mg total) by mouth daily. 12/07/16   Amy D Ninfa Meeker, NP  citalopram (CELEXA) 10 MG tablet Take 1 tablet (10 mg total) by mouth daily. 01/12/17   Jolaine Artist, MD  digoxin (LANOXIN) 0.125 MG tablet Take 1 tablet (125 mcg total)  by mouth daily. 01/12/17   Jolaine Artist, MD  docusate sodium (COLACE) 100 MG capsule Take 2 capsules (200 mg total) by mouth daily. 12/29/16   Jolaine Artist, MD  gabapentin (NEURONTIN) 100 MG capsule Take 2 capsules (200 mg total) by mouth 2 (two) times daily. 01/12/17   Jolaine Artist, MD  insulin aspart (NOVOLOG FLEXPEN) 100 UNIT/ML FlexPen Inject 10 Units into the skin 3 (three) times daily with meals. 12/16/16   Amy D Ninfa Meeker, NP  Insulin Glargine (LANTUS SOLOSTAR) 100 UNIT/ML Solostar Pen Inject 20 Units into the skin daily at 10 pm.    Historical Provider, MD  losartan (COZAAR) 25 MG tablet Take 1 tablet (25 mg total) by mouth daily. 12/07/16   Amy D Clegg,  NP  magnesium oxide (MAG-OX) 400 (241.3 Mg) MG tablet Take 1 tablet (400 mg total) by mouth daily. 12/07/16   Amy D Ninfa Meeker, NP  metolazone (ZAROXOLYN) 2.5 MG tablet Take 2 tablets (5 mg total) by mouth as directed. By [facility] .... Take every Monday, Wednesday, and friday 01/20/17   Jolaine Artist, MD  milrinone [facility]) 20 MG/100 ML SOLN infusion Inject 25.175 mcg/min into the vein continuous. Per [facility] pharmacy 12/22/16   Jolaine Artist, MD  pantoprazole (PROTONIX) 40 MG tablet Take 1 tablet (40 mg total) by mouth daily. 09/25/16   Isaiah Serge, NP  potassium chloride SA (K-DUR,KLOR-CON) 20 MEQ tablet Take 20 mEq by mouth 2 (two) times daily. Take additional 20 mEq tablet on metolazone days    Historical Provider, MD  senna-docusate (SENOKOT-S) 8.6-50 MG tablet Take 2 tablets by mouth daily. 01/20/17   Jolaine Artist, MD  sildenafil (REVATIO) 20 MG tablet Take 1 tablet (20 mg total) by mouth 3 (three) times daily. 12/31/16   Jolaine Artist, MD  spironolactone (ALDACTONE) 25 MG tablet Take 1 tablet (25 mg total) by mouth daily. 12/07/16   Amy D Ninfa Meeker, NP  torsemide (DEMADEX) 20 MG tablet Take 60 mgs in the morning and 40mg s in the evening. Patient taking differently: Take 60 mg by mouth 2 (two) times daily. Take 60 mgs in the morning and 40mg s in the evening. 01/11/17   Larey Dresser, MD  warfarin (COUMADIN) 5 MG tablet Take 2-3 tablets (10-15 mg total) by mouth daily. As directed based on blood checks 01/06/17   Jolaine Artist, MD    Past Medical History: Past Medical History:  Diagnosis Date  . AICD (automatic cardioverter/defibrillator) present   . ASCVD (arteriosclerotic cardiovascular disease)   . Chronic systolic CHF (congestive heart failure) ([place])   . COPD (chronic obstructive pulmonary disease) ([place])   . Coronary artery disease   . Depression   . Diabetes mellitus   . History of cocaine abuse   . Hypertension   . Presence of permanent cardiac pacemaker   .  Shortness of breath dyspnea   . Suicidal ideation   . Tobacco abuse     Past Surgical History: Past Surgical History:  Procedure Laterality Date  . CARDIAC CATHETERIZATION N/A 02/12/2016   Procedure: Right/Left Heart Cath and Coronary/Graft Angiography;  Surgeon: Jolaine Artist, MD;  Location: [place] [facility];  Service: Cardiovascular;  Laterality: N/A;  . CARDIAC CATHETERIZATION N/A 03/22/2016   Procedure: Right Heart Cath;  Surgeon: Jolaine Artist, MD;  Location: [place] [facility];  Service: Cardiovascular;  Laterality: N/A;  . CARDIAC CATHETERIZATION N/A 08/25/2016   Procedure: Right Heart Cath;  Surgeon: Shaune Pascal  Eran Windish, MD;  Location: [place] [facility];  Service: Cardiovascular;  Laterality: N/A;  . CARDIAC CATHETERIZATION N/A 09/03/2016   Procedure: Right Heart Cath;  Surgeon: Jolaine Artist, MD;  Location: [place] [facility];  Service: Cardiovascular;  Laterality: N/A;  . CARDIAC DEFIBRILLATOR PLACEMENT    . CARDIAC DEFIBRILLATOR PLACEMENT    . CHOLECYSTECTOMY N/A 08/30/2015   Procedure: LAPAROSCOPIC CHOLECYSTECTOMY;  Surgeon: Hubbard Robinson, MD;  Location: [name];  Service: General;  Laterality: N/A;  . COLONOSCOPY WITH PROPOFOL N/A 08/18/2016   Procedure: COLONOSCOPY WITH PROPOFOL;  Surgeon: Jerene Bears, MD;  Location: [place];  Service: Gastroenterology;  Laterality: N/A;  . CORONARY ARTERY BYPASS GRAFT  2001  . INSERT / REPLACE / REMOVE PACEMAKER    . INSERTION OF IMPLANTABLE LEFT VENTRICULAR ASSIST DEVICE N/A 09/09/2016   Procedure: INSERTION OF IMPLANTABLE LEFT VENTRICULAR ASSIST DEVICE;  Surgeon: [name], MD;  Location: [place];  Service: Open Heart Surgery;  Laterality: N/A;  HeartMate 3 CIRC ARREST  NITRIC OXIDE  . IR GENERIC HISTORICAL  08/03/2016   IR FLUORO GUIDE CV LINE RIGHT 08/03/2016 [name], MD [facility]  . IR GENERIC HISTORICAL  12/14/2016   IR FLUORO GUIDE CV LINE RIGHT 12/14/2016 [name], MD [facility]  .  LUMBAR DISC SURGERY  02/1999   Discectomy and fusion  . TEE WITHOUT CARDIOVERSION N/A 09/09/2016   Procedure: TRANSESOPHAGEAL ECHOCARDIOGRAM (TEE);  Surgeon: [name], MD;  Location: [place];  Service: Open Heart Surgery;  Laterality: N/A;  . VASECTOMY     Subsequent reversal    Family History: Family History  Problem Relation Age of Onset  . CAD Father   . Diabetes Father   . Heart failure Father     Social History: Social History   Social History  . Marital status: Single    Spouse name: N/A  . Number of children: N/A  . Years of education: N/A   Occupational History  . Retired   . Truck driver    Social History Main Topics  . Smoking status: Former Smoker    Packs/day: 1.50    Years: 23.00    Types: Cigarettes    Quit date: 08/27/2015  . Smokeless tobacco: Never Used  . Alcohol use No  . Drug use: No     Comment: Former  . Sexual activity: Not Currently   Other Topics Concern  . None   Social History Narrative   Divorced with one child   No regular exercise    Allergies:  Allergies  Allergen Reactions  . Codeine Nausea And Vomiting  . Trazodone And Nefazodone Other (See Comments)    Dizziness with subsequent fall   . Lipitor [Atorvastatin] Nausea Only    Nausea with high doses, tolerates 20mg  dose (08/22/16)  . Tape Itching    Objective:    Vital Signs:   Temp:  [98.3 F (36.8 C)] 98.3 F (36.8 C) (04/20 1333) Pulse Rate:  [83] 83 (04/20 1240) Resp:  [20] 20 (04/20 1240) BP: (85)/(74) 85/74 (04/20 1333) SpO2:  [97 %] 97 % (04/20 1240) Weight:  [230 lb 4.8 oz (104.5 kg)] 230 lb 4.8 oz (104.5 kg) (04/20 1333)   Filed Weights   01/21/17 1333  Weight: 230 lb 4.8 oz (104.5 kg)    Mean arterial Pressure: 80  Physical Exam: General:  Fatigued appearing male.  HEENT: Normal Neck: supple. JVP 7-8 . Carotids 2+ bilat; no bruits. No lymphadenopathy or thyromegaly appreciated.  Cor: Mechanical heart sounds with LVAD hum present. Lungs:  Clear in all lobes.  Abdomen: obese. soft, nontender, nondistended. No hepatosplenomegaly. No bruits or masses. Good bowel sounds. Driveline: C/D/I; securement device intact and driveline incorporated Extremities: no cyanosis, clubbing, rash, edema Neuro: alert & orientedx3, cranial nerves grossly intact. moves all 4 extremities w/o difficulty. Affect pleasant  Telemetry: NSR 80s Personally reviewed   Labs: Basic Metabolic Panel:  Recent Labs Lab 01/18/17 1407 01/21/17 1230  NA 120* 119*  K 4.3 3.5  CL 81* 77*  CO2 30 30  GLUCOSE 226* 212*  BUN 42* 29*  CREATININE 1.35* 1.13  CALCIUM 8.7* 8.7*    Liver Function Tests:  Recent Labs Lab 01/21/17 1230  AST 34  ALT 26  ALKPHOS 86  BILITOT 0.9  PROT 7.4  ALBUMIN 3.8    CBC:  Recent Labs Lab 01/18/17 1407 01/21/17 1230  WBC 6.2 7.3  HGB 9.6* 9.1*  HCT 30.4* 28.6*  MCV 74.9* 73.1*  PLT 199 226     BNP: BNP (last 3 results)  Recent Labs  10/13/16 1207 11/30/16 1158 01/21/17 1230  BNP 321.5* 565.5* 649.9*    Coagulation Studies:  Recent Labs  01/21/17 1421  LABPROT 25.0*  INR 2.22   Assessment and Plan/Discussion:    1. Nausea and vomiting:  - Check co ox 62 -> 52.3, does not appear to be low output. Continue milrinone 0.33mcg. He has been on Amiodarone for PAF. Will check sed rate and CT chest to rule out amio toxicity.  - Stop Amiodarone, nausea and vomiting possible side effect 2. Chronic end stage biventricular systolic HF: LVEF 99% due to ICM.  Echo 08/2016 with EF of 15% - s/p HM3 LVAD 24/2683 - Complicated by RV failure.  - NYHA III despite VAD support and milrinone 0.25 at home - Volume status stable, had 2 doses of metolazone this week.  - Give 5mg  metolazone now.  - Torsemide 60mg  BID.  - Continue sildenafil. - VAD parameters stable.  - CVP 9  - He was seen at [facility] - Dr. Hoyt Koch for transplant this week.  - LDH 216.  3. Chronic anticoagulation - On warfarin + ASA - INR goal is  2.0-2.5 - No hematuria or bright red blood per rectum.  3. CAD s/p CABG - No chest pain. - On ASA 4. PAF  - Stop Amio - Will have Medtronic interrogate device.  - On warfarin, INR pending. Recent INR was therapeutic.  5. DM  - Sliding scale ordered.  6. Hyponatremia:  - Na 119 - may need Tolvaptan  7. Severe headache  - Will order head CT to rule out ICH.   I reviewed the LVAD parameters from today, and compared the results to the patient's prior recorded data.  No programming changes were made.  The LVAD is functioning within specified parameters.  The patient performs LVAD self-test daily.  LVAD interrogation was negative for any significant power changes, alarms or PI events/speed drops.  LVAD equipment check completed and is in good working order.  Back-up equipment present.   LVAD education done on emergency procedures and precautions and reviewed exit site care.  Length of Stay: [place], NP 01/21/2017, 3:15 PM  VAD Team Pager [phone] (7am - 7am) +++VAD ISSUES ONLY+++ Advanced Heart Failure Team Pager [phone] (M-F; [place])  Please contact [facility] for night-coverage after hours (4p -7a ) and weekends on [email or web address] for all non- LVAD Issues  Patient seen  and examined with Jettie Booze, NP. We discussed all aspects of the encounter. I agree with the assessment and plan as stated above.   He continues to struggle despite VAD and milrinone support. Recently has been complaining of volume overload and was treated with metolazone for several days. Volume status currently improved and co-ox marginal but OK.   He has significant hyponatremia which I suspect is related to metolazone. Will hold diuretics.   CT scan of chest and head reviewed personally. He has evidence of thyroid nodule which we will u/s to further characterize. Small right pleural effusion with adjacent thickening. Has had previous tap that was transudative. Head CT unremarkable.  I continue to feel  that a good deal of his SOB, nausea, fatigue, insomnia and HAs are related to severe depression and anxiety and not HF as his subjective complaints continue to be far beyond objective findings.  Will consider RHC on Monday.  ICD interrogation done personally. No VT/AF. Optivol ok.(last crossing back in March)   VAD parameters are ok.   Glori Bickers, MD  6:25 PM

## 2017-01-21 NOTE — ED Provider Notes (Signed)
[facility] DEPT Provider Note   CSN: [id number] Arrival date & time: 01/21/17  1220     History   Chief Complaint Chief Complaint  Patient presents with  . Abdominal Pain  . Shortness of Breath    HPI Ronald Miller. is a 61 y.o. male.  HPI   Pt is a 61 yo male with PMH of chronic systolic HF s/p [employer] ICD 2014, CAD s/p CABG in 2000, HTN, Hx of cocaine abuse, Tobacco abuse, Depression, COPD and LVAD (placed on 09/09/16) who presents to the ED via EMS from home. Pt reports having worsening weakness, SOB. He also states he has been having N/V for the past week. Pt was recently evaluated at [facility] for evaluation regarding a heart transplant. Pt reports he is currently being followed by the cardiology at [facility] and was seen in their office earlier this week. Denies fever, CP, cough, abdominal pain, diarrhea, leg swelling.   Past Medical History:  Diagnosis Date  . AICD (automatic cardioverter/defibrillator) present   . ASCVD (arteriosclerotic cardiovascular disease)   . Chronic systolic CHF (congestive heart failure) ([place])   . COPD (chronic obstructive pulmonary disease) ([place])   . Coronary artery disease   . Depression   . Diabetes mellitus   . History of cocaine abuse   . Hypertension   . Presence of permanent cardiac pacemaker   . Shortness of breath dyspnea   . Suicidal ideation   . Tobacco abuse     Patient Active Problem List   Diagnosis Date Noted  . Nausea & vomiting 01/21/2017  . Acute on chronic systolic CHF (congestive heart failure), NYHA class 4 ([place]) 11/30/2016  . LVAD (left ventricular assist device) present ([place])   . Palliative care by specialist   . Cirrhosis ([place])   . Cellulitis and abscess of leg 08/22/2016  . Heart failure ([place]) 08/22/2016  . Special screening for malignant neoplasms, colon 07/16/2016  . DNR (do not resuscitate) discussion   . Palliative care encounter   . Cardiogenic shock ([place])   . Acute systolic congestive heart failure,  NYHA class 4 ([place])   . Pain in the chest   . Dyslipidemia associated with type 2 diabetes mellitus ([place])   . Hyponatremia   . Emesis   . Atypical chest pain 03/19/2016  . Ascites 03/10/2016  . ICD (implantable cardioverter-defibrillator) in place 03/09/2016  . CHF (congestive heart failure) ([place]) 02/23/2016  . Chronic systolic heart failure ([place]) 02/04/2016  . Insomnia 02/04/2016  . Pressure ulcer 10/15/2015  . Fluid overload 10/14/2015  . Chronic cholecystitis with calculus   . COPD (chronic obstructive pulmonary disease) ([place]) 08/28/2015  . HTN (hypertension) 08/28/2015  . Depression 08/28/2015  . Elevated troponin 08/28/2015  . AICD discharge 08/28/2015  . Hypokalemia 08/28/2015  . Hypotension 08/28/2015  . Arteriosclerosis of coronary artery 03/05/2014  . IDDM (insulin dependent diabetes mellitus) ([place]) 03/05/2014  . HLD (hyperlipidemia) 03/05/2014  . Type 2 diabetes mellitus ([place]) 08/06/2009  . Cardiovascular disease 08/06/2009  . LOW BACK PAIN, CHRONIC 08/06/2009    Past Surgical History:  Procedure Laterality Date  . CARDIAC CATHETERIZATION N/A 02/12/2016   Procedure: Right/Left Heart Cath and Coronary/Graft Angiography;  Surgeon: Jolaine Artist, MD;  Location: [place] CV LAB;  Service: Cardiovascular;  Laterality: N/A;  . CARDIAC CATHETERIZATION N/A 03/22/2016   Procedure: Right Heart Cath;  Surgeon: Jolaine Artist, MD;  Location: [place] CV LAB;  Service: Cardiovascular;  Laterality: N/A;  . CARDIAC CATHETERIZATION N/A 08/25/2016  Procedure: Right Heart Cath;  Surgeon: Jolaine Artist, MD;  Location: [place] [facility];  Service: Cardiovascular;  Laterality: N/A;  . CARDIAC CATHETERIZATION N/A 09/03/2016   Procedure: Right Heart Cath;  Surgeon: Jolaine Artist, MD;  Location: [place] [facility] LAB;  Service: Cardiovascular;  Laterality: N/A;  . CARDIAC DEFIBRILLATOR PLACEMENT    . CARDIAC DEFIBRILLATOR PLACEMENT    . CHOLECYSTECTOMY N/A 08/30/2015    Procedure: LAPAROSCOPIC CHOLECYSTECTOMY;  Surgeon: Hubbard Robinson, MD;  Location: [name];  Service: General;  Laterality: N/A;  . COLONOSCOPY WITH PROPOFOL N/A 08/18/2016   Procedure: COLONOSCOPY WITH PROPOFOL;  Surgeon: Jerene Bears, MD;  Location: [place] [facility];  Service: Gastroenterology;  Laterality: N/A;  . CORONARY ARTERY BYPASS GRAFT  2001  . INSERT / REPLACE / REMOVE PACEMAKER    . INSERTION OF IMPLANTABLE LEFT VENTRICULAR ASSIST DEVICE N/A 09/09/2016   Procedure: INSERTION OF IMPLANTABLE LEFT VENTRICULAR ASSIST DEVICE;  Surgeon: [name], MD;  Location: [place];  Service: Open Heart Surgery;  Laterality: N/A;  HeartMate 3 CIRC ARREST  NITRIC OXIDE  . IR GENERIC HISTORICAL  08/03/2016   IR FLUORO GUIDE CV LINE RIGHT 08/03/2016 [name], MD [facility]INTER[facility]  . IR GENERIC HISTORICAL  12/14/2016   IR FLUORO GUIDE CV LINE RIGHT 12/14/2016 [name], MD [facility]  . LUMBAR DISC SURGERY  02/1999   Discectomy and fusion  . TEE WITHOUT CARDIOVERSION N/A 09/09/2016   Procedure: TRANSESOPHAGEAL ECHOCARDIOGRAM (TEE);  Surgeon: [name], MD;  Location: [facility];  Service: Open Heart Surgery;  Laterality: N/A;  . VASECTOMY     Subsequent reversal       Home Medications    Prior to Admission medications   Medication Sig Start Date End Date Taking? Authorizing Provider  amiodarone (PACERONE) 200 MG tablet Take 1 tablet (200 mg total) by mouth daily. 01/12/17   Jolaine Artist, MD  aspirin EC 81 MG EC tablet Take 1 tablet (81 mg total) by mouth daily. 12/07/16   Amy D Ninfa Meeker, NP  citalopram (CELEXA) 10 MG tablet Take 1 tablet (10 mg total) by mouth daily. 01/12/17   Jolaine Artist, MD  digoxin (LANOXIN) 0.125 MG tablet Take 1 tablet (125 mcg total) by mouth daily. 01/12/17   Jolaine Artist, MD  docusate sodium (COLACE) 100 MG capsule Take 2 capsules (200 mg total) by mouth daily. 12/29/16   Jolaine Artist, MD  gabapentin (NEURONTIN) 100 MG capsule Take 2  capsules (200 mg total) by mouth 2 (two) times daily. 01/12/17   Jolaine Artist, MD  insulin aspart (NOVOLOG FLEXPEN) 100 UNIT/ML FlexPen Inject 10 Units into the skin 3 (three) times daily with meals. 12/16/16   Amy D Ninfa Meeker, NP  Insulin Glargine (LANTUS SOLOSTAR) 100 UNIT/ML Solostar Pen Inject 20 Units into the skin daily at 10 pm.    Historical Provider, MD  losartan (COZAAR) 25 MG tablet Take 1 tablet (25 mg total) by mouth daily. 12/07/16   Amy D Clegg, NP  magnesium oxide (MAG-OX) 400 (241.3 Mg) MG tablet Take 1 tablet (400 mg total) by mouth daily. 12/07/16   Amy D Ninfa Meeker, NP  metolazone (ZAROXOLYN) 2.5 MG tablet Take 2 tablets (5 mg total) by mouth as directed. By [facility] Failure [facility] .... Take every Monday, Wednesday, and friday 01/20/17   Jolaine Artist, MD  milrinone [facility]) 20 MG/100 ML SOLN infusion Inject 25.175 mcg/min into the vein continuous. Per [facility] pharmacy 12/22/16   Quillian Quince  R Bensimhon, MD  pantoprazole (PROTONIX) 40 MG tablet Take 1 tablet (40 mg total) by mouth daily. 09/25/16   Isaiah Serge, NP  potassium chloride SA (K-DUR,KLOR-CON) 20 MEQ tablet Take 20 mEq by mouth 2 (two) times daily. Take additional 20 mEq tablet on metolazone days    Historical Provider, MD  senna-docusate (SENOKOT-S) 8.6-50 MG tablet Take 2 tablets by mouth daily. 01/20/17   Jolaine Artist, MD  sildenafil (REVATIO) 20 MG tablet Take 1 tablet (20 mg total) by mouth 3 (three) times daily. 12/31/16   Jolaine Artist, MD  spironolactone (ALDACTONE) 25 MG tablet Take 1 tablet (25 mg total) by mouth daily. 12/07/16   Amy D Ninfa Meeker, NP  torsemide (DEMADEX) 20 MG tablet Take 60 mgs in the morning and 40mg s in the evening. Patient taking differently: Take 60 mg by mouth 2 (two) times daily. Take 60 mgs in the morning and 40mg s in the evening. 01/11/17   Larey Dresser, MD  warfarin (COUMADIN) 5 MG tablet Take 2-3 tablets (10-15 mg total) by mouth daily. As directed based on blood checks 01/06/17   Jolaine Artist, MD    Family History Family History  Problem Relation Age of Onset  . CAD Father   . Diabetes Father   . Heart failure Father     Social History Social History  Substance Use Topics  . Smoking status: Former Smoker    Packs/day: 1.50    Years: 23.00    Types: Cigarettes    Quit date: 08/27/2015  . Smokeless tobacco: Never Used  . Alcohol use No     Allergies   Codeine; Trazodone and nefazodone; Lipitor [atorvastatin]; and Tape   Review of Systems Review of Systems  Respiratory: Positive for shortness of breath.   Gastrointestinal: Positive for nausea and vomiting.  Neurological: Positive for weakness.  All other systems reviewed and are negative.    Physical Exam Updated Vital Signs Pulse 83   Resp 20   SpO2 97%   Physical Exam  Constitutional: He is oriented to person, place, and time. He appears well-developed and well-nourished. No distress.  HENT:  Head: Normocephalic and atraumatic.  Eyes: Conjunctivae and EOM are normal. Right eye exhibits no discharge. Left eye exhibits no discharge. No scleral icterus.  Neck: Normal range of motion. Neck supple.  Cardiovascular:  Mechanical heart sounds with LVAD hum present  Pulmonary/Chest: Effort normal and breath sounds normal. No respiratory distress. He has no wheezes. He has no rales. He exhibits no tenderness.  Abdominal: Soft. Bowel sounds are normal. He exhibits no distension and no mass. There is no tenderness. There is no rebound and no guarding.  Musculoskeletal: Normal range of motion. He exhibits no edema.  Neurological: He is alert and oriented to person, place, and time.  Skin: Skin is warm and dry. He is not diaphoretic.  Nursing note and vitals reviewed.    ED Treatments / Results  Labs (all labs ordered are listed, but only abnormal results are displayed) Labs Reviewed  CBC  COMPREHENSIVE METABOLIC PANEL  LACTATE DEHYDROGENASE  BRAIN [place] PANEL     EKG  EKG Interpretation None       Radiology No results found.  Procedures Procedures (including critical care time)  Medications Ordered in ED Medications - No data to display   Initial Impression / Assessment and Plan / ED Course  I have reviewed the triage vital signs and the nursing notes.  Pertinent labs &  imaging results that were available during my care of the patient were reviewed by me and considered in my medical decision making (see chart for details).     Pt is 61 yo LVAD pt coming in with worsening SOB, weakness, N/V. Pt is followed by [facility] clinic, Dr. Haroldine Laws. VSS. Exam unremarkable. Pt is alert, active and nontoxic appearing. I spoke with cardiology NP, Amy Clegg, at pt's bedside during my initial evaluation. She reports they are planning on order labs and admitting the pt to their service for further evaluation and management. Discussed plan for admission with pt.   Final Clinical Impressions(s) / ED Diagnoses   Final diagnoses:  Shortness of breath  LVAD (left ventricular assist device) present ([place])  Weakness    New Prescriptions New Prescriptions   No medications on file     Nona Dell, PA-C 01/21/17 [place], MD 01/22/17 [phone]

## 2017-01-22 ENCOUNTER — Inpatient Hospital Stay (HOSPITAL_COMMUNITY): Payer: Medicare HMO

## 2017-01-22 ENCOUNTER — Encounter (HOSPITAL_COMMUNITY): Payer: Self-pay | Admitting: *Deleted

## 2017-01-22 DIAGNOSIS — E871 Hypo-osmolality and hyponatremia: Secondary | ICD-10-CM

## 2017-01-22 LAB — CBC WITH DIFFERENTIAL/PLATELET
Basophils Absolute: 0 10*3/uL (ref 0.0–0.1)
Basophils Relative: 0 %
Eosinophils Absolute: 0 10*3/uL (ref 0.0–0.7)
Eosinophils Relative: 1 %
HCT: 28.6 % — ABNORMAL LOW (ref 39.0–52.0)
Hemoglobin: 9 g/dL — ABNORMAL LOW (ref 13.0–17.0)
Lymphocytes Relative: 14 %
Lymphs Abs: 0.9 10*3/uL (ref 0.7–4.0)
MCH: 23.2 pg — ABNORMAL LOW (ref 26.0–34.0)
MCHC: 31.5 g/dL (ref 30.0–36.0)
MCV: 73.7 fL — ABNORMAL LOW (ref 78.0–100.0)
Monocytes Absolute: 0.6 10*3/uL (ref 0.1–1.0)
Monocytes Relative: 9 %
Neutro Abs: 5.2 10*3/uL (ref 1.7–7.7)
Neutrophils Relative %: 76 %
Platelets: 226 10*3/uL (ref 150–400)
RBC: 3.88 MIL/uL — ABNORMAL LOW (ref 4.22–5.81)
RDW: 16.9 % — ABNORMAL HIGH (ref 11.5–15.5)
WBC: 6.8 10*3/uL (ref 4.0–10.5)

## 2017-01-22 LAB — GLUCOSE, CAPILLARY
GLUCOSE-CAPILLARY: 193 mg/dL — AB (ref 65–99)
Glucose-Capillary: 168 mg/dL — ABNORMAL HIGH (ref 65–99)
Glucose-Capillary: 252 mg/dL — ABNORMAL HIGH (ref 65–99)
Glucose-Capillary: 162 mg/dL — ABNORMAL HIGH (ref 65–99)

## 2017-01-22 LAB — COOXEMETRY PANEL
Carboxyhemoglobin: 1.2 % (ref 0.5–1.5)
Carboxyhemoglobin: 1.9 % — ABNORMAL HIGH (ref 0.5–1.5)
METHEMOGLOBIN: 1 % (ref 0.0–1.5)
Methemoglobin: 0.9 % (ref 0.0–1.5)
O2 Saturation: 47.2 %
O2 Saturation: 53 %
Total hemoglobin: 12.4 g/dL (ref 12.0–16.0)
Total hemoglobin: 9.2 g/dL — ABNORMAL LOW (ref 12.0–16.0)

## 2017-01-22 LAB — BASIC METABOLIC PANEL WITH GFR
Anion gap: 7 (ref 5–15)
BUN: 24 mg/dL — ABNORMAL HIGH (ref 6–20)
CO2: 34 mmol/L — ABNORMAL HIGH (ref 22–32)
Calcium: 8.5 mg/dL — ABNORMAL LOW (ref 8.9–10.3)
Chloride: 77 mmol/L — ABNORMAL LOW (ref 101–111)
Creatinine, Ser: 1.03 mg/dL (ref 0.61–1.24)
GFR calc Af Amer: >60 mL/min
GFR calc non Af Amer: >60 mL/min
Glucose, Bld: 151 mg/dL — ABNORMAL HIGH (ref 65–99)
Potassium: 3.3 mmol/L — ABNORMAL LOW (ref 3.5–5.1)
Sodium: 118 mmol/L — CL (ref 135–145)

## 2017-01-22 LAB — PROTIME-INR
INR: 2.2
Prothrombin Time: 24.8 s — ABNORMAL HIGH (ref 11.4–15.2)

## 2017-01-22 LAB — MAGNESIUM: MAGNESIUM: 2.1 mg/dL (ref 1.7–2.4)

## 2017-01-22 LAB — LACTATE DEHYDROGENASE: LDH: 197 U/L — AB (ref 98–192)

## 2017-01-22 MED ORDER — TOLVAPTAN 15 MG PO TABS
15.0000 mg | ORAL_TABLET | ORAL | Status: DC
  Administered 2017-01-22 – 2017-01-24 (×3): 15 mg via ORAL
  Filled 2017-01-22 (×4): qty 1

## 2017-01-22 MED ORDER — POTASSIUM CHLORIDE CRYS ER 10 MEQ PO TBCR
30.0000 meq | EXTENDED_RELEASE_TABLET | ORAL | Status: AC
  Administered 2017-01-22: 30 meq via ORAL
  Filled 2017-01-22: qty 1

## 2017-01-22 MED ORDER — WARFARIN SODIUM 7.5 MG PO TABS
15.0000 mg | ORAL_TABLET | Freq: Once | ORAL | Status: AC
  Administered 2017-01-22: 15 mg via ORAL
  Filled 2017-01-22: qty 2

## 2017-01-22 MED ORDER — MILRINONE LACTATE IN DEXTROSE 20-5 MG/100ML-% IV SOLN
0.5000 ug/kg/min | INTRAVENOUS | Status: DC
  Administered 2017-01-22 – 2017-01-24 (×6): 0.375 ug/kg/min via INTRAVENOUS
  Administered 2017-01-24 – 2017-01-25 (×2): 0.5 ug/kg/min via INTRAVENOUS
  Filled 2017-01-22 (×9): qty 100

## 2017-01-22 NOTE — Progress Notes (Signed)
CRITICAL VALUE ALERT  Critical value received: NA 118  Date of notification: 01/22/2017  Time of notification: 0640  Critical value read back:Yes.    Nurse who received alert:  A. Ciclaly Mulcahey,RN  MD notified (1st page): Means, Belenda Cruise MD  Time of first page: [phone]  MD notified (2nd page):  Time of second page:  Responding MD: Cherre Robins, MD  Time MD responded: [phone]

## 2017-01-22 NOTE — Progress Notes (Signed)
[place] for Coumadin Indication: LVAD  Allergies  Allergen Reactions  . Codeine Nausea And Vomiting  . Trazodone And Nefazodone Other (See Comments)    Dizziness with subsequent fall   . Lipitor [Atorvastatin] Nausea Only    Nausea with high doses, tolerates 20mg  dose (08/22/16)  . Tape Itching    Patient Measurements: Height: 5\' 9"  (175.3 cm) Weight: 229 lb 6.4 oz (104.1 kg) IBW/kg (Calculated) : 70.7  Vital Signs: Temp: 98.3 F (36.8 C) (04/21 0800) Temp Source: Oral (04/21 0800) BP: 92/72 (04/21 0800) Pulse Rate: 86 (04/21 0000)  Labs:  Recent Labs  01/21/17 1230 01/21/17 1421 01/22/17 0438  HGB 9.1*  --  9.0*  HCT 28.6*  --  28.6*  PLT 226  --  226  LABPROT  --  25.0* 24.8*  INR  --  2.22 2.20  CREATININE 1.13  --  1.03    Estimated Creatinine Clearance: 90.7 mL/min (by C-G formula based on SCr of 1.03 mg/dL).   Medical History: Past Medical History:  Diagnosis Date  . AICD (automatic cardioverter/defibrillator) present   . ASCVD (arteriosclerotic cardiovascular disease)   . Chronic systolic CHF (congestive heart failure) ([place])   . COPD (chronic obstructive pulmonary disease) ([place])   . Coronary artery disease   . Depression   . Diabetes mellitus   . History of cocaine abuse   . Hypertension   . Presence of permanent cardiac pacemaker   . Shortness of breath dyspnea   . Suicidal ideation   . Tobacco abuse     Assessment: 61yom s/p LVAD on coumadin. INR therapeutic on admission at 2.22. CBC, LDH continues to stable.  INR this am stable 2.2, dose not charted last night, will check with patient to see if he received. Will try and keep INR in lower range with possible RHC Monday.  Home dose: 15mg  daily except 10mg  on Tuesday  Goal of Therapy:  2-2.5 Monitor platelets by anticoagulation protocol: Yes   Plan:  1) Repeat Coumadin 15mg  tonight 2) Daily INR  Erin Hearing PharmD., BCPS Clinical  Pharmacist Pager [phone] 01/22/2017 8:49 AM

## 2017-01-22 NOTE — Progress Notes (Addendum)
HeartMate 2 Rounding Note  Subjective:    Feeling somewhat better but still nauseated. Dyspneic with any ambulation. Remains on milrinone 0.25. Co-ox 47% CVP 11  LVAD INTERROGATION:  HeartMate II LVAD:  Flow 5.6 liters/min, speed 5900, power 5.0, PI 3.1.    Objective:    Vital Signs:   Temp:  [97.7 F (36.5 C)-98.6 F (37 C)] 97.9 F (36.6 C) (04/21 1236) Pulse Rate:  [86-100] 87 (04/21 1236) Resp:  [13-16] 15 (04/21 1236) BP: (85-113)/(72-81) 113/81 (04/21 1236) SpO2:  [98 %-99 %] 98 % (04/21 0800) Weight:  [104.1 kg (229 lb 6.4 oz)-104.5 kg (230 lb 4.8 oz)] 104.1 kg (229 lb 6.4 oz) (04/21 0400)   Mean arterial Pressure 80-90s  Intake/Output:   Intake/Output Summary (Last 24 hours) at 01/22/17 1253 Last data filed at 01/22/17 1239  Gross per 24 hour  Intake           458.46 ml  Output             1850 ml  Net         -1391.54 ml     Physical Exam: General:  Fatigued appearing. Nauseated  HEENT: normal Neck: supple. JVP to jaw . Carotids 2+ bilat; no bruits. No lymphadenopathy or thryomegaly appreciated. Cor: Mechanical heart sounds with LVAD hum present. Lungs:basilar crackles  Abdomen: soft, nontender, + distended. No hepatosplenomegaly. No bruits or masses. Good bowel sounds. Driveline: C/D/I; securement device intact and driveline incorporated Extremities: no cyanosis, clubbing, rash, 1+ edema Neuro: alert & orientedx3, cranial nerves grossly intact. moves all 4 extremities w/o difficulty. Affect pleasant  Telemetry: NSR 80s. Personally reviewed   Labs: Basic Metabolic Panel:  Recent Labs Lab 01/18/17 1407 01/21/17 1230 01/22/17 0438  NA 120* 119* 118*  K 4.3 3.5 3.3*  CL 81* 77* 77*  CO2 30 30 34*  GLUCOSE 226* 212* 151*  BUN 42* 29* 24*  CREATININE 1.35* 1.13 1.03  CALCIUM 8.7* 8.7* 8.5*  MG  --   --  2.1    Liver Function Tests:  Recent Labs Lab 01/21/17 1230  AST 34  ALT 26  ALKPHOS 86  BILITOT 0.9  PROT 7.4  ALBUMIN 3.8   No  results for input(s): LIPASE, AMYLASE in the last 168 hours. No results for input(s): AMMONIA in the last 168 hours.  CBC:  Recent Labs Lab 01/18/17 1407 01/21/17 1230 01/22/17 0438  WBC 6.2 7.3 6.8  NEUTROABS  --   --  5.2  HGB 9.6* 9.1* 9.0*  HCT 30.4* 28.6* 28.6*  MCV 74.9* 73.1* 73.7*  PLT 199 226 226    INR:  Recent Labs Lab 01/18/17 1407 01/21/17 1421 01/22/17 0438  INR 2.56 2.22 2.20    Other results:    Imaging: Ct Head Wo Contrast  Result Date: 01/21/2017 CLINICAL DATA:  Headache. Increased abdominal bloating and dyspnea. Left ventricular assist device in place. EXAM: CT HEAD WITHOUT CONTRAST TECHNIQUE: Contiguous axial images were obtained from the base of the skull through the vertex without intravenous contrast. COMPARISON:  None. FINDINGS: Brain: No acute infarct, hemorrhage, or mass lesion is present. The ventricles are of normal size. No significant extraaxial fluid collection is present. The brainstem and cerebellum are unremarkable. Vascular: Atherosclerotic calcifications are present within the cavernous internal carotid arteries. There is no hyperdense vessel. Skull: The calvarium is intact. No focal lytic or blastic lesions are present. Sinuses/Orbits: The paranasal sinuses and mastoid air cells are clear. 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 No discrete  mass lesion is present. The globes are within normal limits bilaterally. IMPRESSION: 1. Normal CT appearance of the brain. 2. Stable bilateral exophthalmos. Electronically Signed   By: San Morelle M.D.   On: 01/21/2017 16:34   Ct Chest Wo Contrast  Result Date: 01/21/2017 CLINICAL DATA:  Acute onset of dyspnea and generalized abdominal bloating. Difficulty sleeping. Initial encounter. EXAM: CT CHEST WITHOUT CONTRAST TECHNIQUE: Multidetector CT imaging of the chest was performed following the standard protocol without IV contrast. COMPARISON:  Chest radiograph performed earlier today at 2:41 p.m.  FINDINGS: Cardiovascular: The heart is mildly enlarged. Diffuse coronary artery calcifications are seen. Postoperative change is seen about the mediastinum. An underlying pacemaker/AICD is noted. There is aneurysmal dilatation of the ascending thoracic aorta to 4.6 cm in AP dimension. Scattered calcification is noted along the aortic arch and proximal great vessels. Mediastinum/Nodes: The patient is status post median sternotomy. A mildly enlarged subcarinal node is seen, measuring 1.3 cm in short axis. No additional mediastinal lymphadenopathy is seen. No pericardial effusion is identified. A 1.7 cm hypodensity is noted at the right thyroid lobe. No axillary lymphadenopathy is seen. Lungs/Pleura: A small left pleural effusion demonstrates surrounding pleural thickening, raising question for a small empyema. Associated left basilar airspace opacity may reflect atelectasis or pneumonia. Would correlate for any clinical evidence for empyema. No pneumothorax is seen.  No dominant mass is identified. Upper Abdomen: The visualized portions of the liver and spleen are unremarkable. The visualized portions of the pancreas are within normal limits. Musculoskeletal: No acute osseous abnormalities are identified. The visualized musculature is unremarkable in appearance. Nonspecific minimally decreased density is noted within vertebral body T9, stable from 2017. IMPRESSION: 1. Small pleural effusion demonstrates surrounding pleural thickening, raising question for a small empyema. Associated left basilar airspace opacity may reflect atelectasis or pneumonia. Would correlate clinically for any evidence of empyema. 2. Mild cardiomegaly.  Diffuse coronary artery calcifications seen. 3. Mildly enlarged 1.3 cm subcarinal node, of uncertain significance. 4. Aneurysmal dilatation of the ascending thoracic aorta to 4.6 cm in AP dimension. Recommend semi-annual imaging followup by CTA or MRA and referral to cardiothoracic surgery if  not already obtained. This recommendation follows 2010 ACCF/AHA/AATS/ACR/ASA/SCA/SCAI/SIR/STS/SVM Guidelines for the Diagnosis and Management of Patients With Thoracic Aortic Disease. Circulation. 2010; 121: e266-e369 5. **An incidental finding of potential clinical significance has been found. 1.7 cm hypodensity at the right thyroid lobe. Consider further evaluation with thyroid ultrasound. If patient is clinically hyperthyroid, consider nuclear medicine thyroid uptake and scan.** Electronically Signed   By: Garald Balding M.D.   On: 01/21/2017 16:59   Dg Chest Port 1 View  Result Date: 01/21/2017 CLINICAL DATA:  Chest pain and shortness of Breath EXAM: PORTABLE CHEST 1 VIEW COMPARISON:  12/01/2016 FINDINGS: Cardiac shadow is enlarged. Defibrillator and LVAD device are noted and stable. Right-sided PICC line is noted at the cavoatrial junction in satisfactory position. No focal infiltrate is noted. IMPRESSION: No acute abnormality noted. Electronically Signed   By: Inez Catalina M.D.   On: 01/21/2017 15:06   Dg Abd Portable 1v  Result Date: 01/22/2017 CLINICAL DATA:  Emesis. EXAM: PORTABLE ABDOMEN - 1 VIEW COMPARISON:  02/24/2016 FINDINGS: Gas and stool are present in nondilated colon. There is scattered gas in nondistended small bowel without evidence of obstruction. No gross intraperitoneal free air is identified on this supine study. A left ventricular assist device is partially visualized, and sequelae of prior right inguinal hernia repair and cholecystectomy are noted. There is mild multilevel lumbar  disc degeneration. IMPRESSION: Nonobstructed bowel gas pattern. Electronically Signed   By: Logan Bores M.D.   On: 01/22/2017 09:12      Medications:     Scheduled Medications: . aspirin EC  81 mg Oral Daily  . citalopram  10 mg Oral Daily  . digoxin  125 mcg Oral Daily  . docusate sodium  200 mg Oral Daily  . gabapentin  200 mg Oral BID  . insulin aspart  0-20 Units Subcutaneous TID WC  .  insulin glargine  20 Units Subcutaneous Daily  . losartan  25 mg Oral Daily  . magnesium oxide  400 mg Oral Daily  . pantoprazole  40 mg Oral Daily  . senna-docusate  2 tablet Oral Daily  . sildenafil  20 mg Oral TID  . spironolactone  25 mg Oral Daily  . warfarin  15 mg Oral ONCE-1800  . Warfarin - Pharmacist Dosing Inpatient   Does not apply q1800     Infusions: . milrinone 0.25 mcg/kg/min (01/22/17 0800)     PRN Medications:  acetaminophen, LORazepam, magnesium hydroxide, ondansetron (ZOFRAN) IV, traMADol   Assessment/plan:   1. Acute on chronic end stage biventricular systolic HF: LVEF 94% due to ICM.  - s/p HM3 LVAD 58/5929 - Complicated by RV failure requiring milrinone 0.25 at home - Co-ox now falling despite milrinone. Will repeat now. Increase milrinone to 0.375  - CVP up. Will start IV lasix  - [facility] seen at [facility] regarding transplant candidacy. May have to expedite this.  - May need RHC on Monday. - LDH 197 2. Chronic anticoagulation - On warfarin + ASA - INR goal is 2.0-2.5 -INR 2.20 today. D/w PharmD regarding dosing 3. CAD s/p CABG - No ischemic s/s.  - On ASA 4. PAF  - Currently off amio. Maintaing NSR 5. DM  - Sliding scale ordered.  6. Hyponatremia:  - Na 119-> 118. Likely due to HF and diuretics. Fluid restrict. Will give one doe tolvaptan today.  7. Severe headache  - CT reviewed personally. No ICH.    I reviewed the LVAD parameters from today, and compared the results to the patient's prior recorded data.  No programming changes were made.  The LVAD is functioning within specified parameters.  The patient performs LVAD self-test daily.  LVAD interrogation was negative for any significant power changes, alarms or PI events/speed drops.  LVAD equipment check completed and is in good working order.  Back-up equipment present.   LVAD education done on emergency procedures and precautions and reviewed exit site care.  Length of Stay:  1  Bensimhon, Daniel 01/22/2017, 12:53 PM  VAD Team --- VAD ISSUES ONLY--- Pager [phone] (7am - 7am)  Advanced Heart Failure Team  Pager [phone] (M-F; 7a - 4p)  Please contact [facility] for night-coverage after hours (4p -7a ) and weekends on [email or web address]

## 2017-01-22 NOTE — Progress Notes (Signed)
Advanced Heart Failure Clinic Note    Referring Provider: Darylene Price, FNP Primary Care: [facility] Primary Cardiologist: Dr Haroldine Laws.   HPI:  Ronald Miller. is a 61 y.o. male with history of chronic systolic HF s/p Medtronic ICD 2014, CAD s/p CABG in 2000, HTN, Hx of cocaine abuse, Tobacco abuse, Depression, and COPD.   Underwent HM 3 placement 09/09/16. Post-op course complicated by RV failure, volume overload, left pleural effusion and PAF. He was diuresed successfully. Milrinone weaned to off. Underwent ramp echo prior to d/c and speed adjusted. Also underwent left thoracentesis with IR (600 cc off) with moderate improvement in size of effusion. Warfarin loaded. INR 2.0. Co-ox on date of d/c was 52% but he was ambulating well without symptoms. PICC line pulled prior to discharge.   In 10/13/16 had repeat thoracentesis.   Hospitalized from 11/30/16-12/06/16 with volume overload, DOE and orthopnea. Weight was up 15 pounds, mixed venous sat was 50% consistent  low output. He was diuresed with IV Lasix then transitioned to torsemide 40mg  daily. Had a ramp echo on 12/01/16, and speed increased to 5900 -> 6000. The following morning his MV sat was down into 40s with frequent PI events so speed turned back down to 5900 and diuresis continued. Milrinone started. He was discharged on 0.186mcg of Milrinone. Discharge weight was 224 pounds.   He saw him 2 days ago for an add-on visit due to bloating, LE edema and progressive DOE. Weight was stable from 230 but up from baseline. Metolazone was increased. Returns for 2-day f/u. Weigh down about 5 pounds. Feeling some better but still fatigued and worried about his health. He was seen at [facility] yesterday and was told that he was reasonable candidate for transplant w/u. Says he is still not sleeping well. Remains on milrinone at 0.25 He denies tobacco use or other substance abuse since getting VAD. Mother and father participating in his  care. Denies problems with dressing site. No bleeding. No fevers or chills.   Denies LVAD trauma. Denies driveline trauma, erythema, or drainage. No ICD shocks.   Reports taking Coumadin as prescribed and adherence to anticoagulation based dietary restrictions. Denies bright red blood per rectum or melena, no dark urine or hematuria.    VAD Indication: Destination therapy- eval ongoing at [facility]  VAD interrogation & Equipment Management: Speed:5900 Flow: 5.6 Power:4.7 w PI:3.3  Alarms: no clinical alarms Events: 3-5 PI events daily  Fixed speed 5900 Low speed limit: 5600  Past Medical History:  Diagnosis Date  . AICD (automatic cardioverter/defibrillator) present   . ASCVD (arteriosclerotic cardiovascular disease)   . Chronic systolic CHF (congestive heart failure) ([place])   . COPD (chronic obstructive pulmonary disease) ([place])   . Coronary artery disease   . Depression   . Diabetes mellitus   . History of cocaine abuse   . Hypertension   . Presence of permanent cardiac pacemaker   . Shortness of breath dyspnea   . Suicidal ideation   . Tobacco abuse     No current facility-administered medications for this encounter.    No current outpatient prescriptions on file.   Facility-Administered Medications Ordered in Other Encounters  Medication Dose Route Frequency Provider Last Rate Last Dose  . acetaminophen (TYLENOL) tablet 650 mg  650 mg Oral Q4H PRN Conrad Mecosta, NP   650 mg at 01/22/17 2206  . aspirin EC tablet 81 mg  81 mg Oral Daily Amy D Clegg, NP   81 mg at 01/22/17  1022  . citalopram (CELEXA) tablet 10 mg  10 mg Oral Daily Amy D Clegg, NP   10 mg at 01/22/17 1025  . digoxin (LANOXIN) tablet 125 mcg  125 mcg Oral Daily Amy D Clegg, NP   125 mcg at 01/22/17 1026  . docusate sodium (COLACE) capsule 200 mg  200 mg Oral Daily Amy D Clegg, NP   200 mg at 01/22/17 1025  . gabapentin (NEURONTIN) capsule 200 mg  200 mg Oral BID Conrad Webster, NP   200 mg at 01/22/17 2202  .  insulin aspart (novoLOG) injection 0-20 Units  0-20 Units Subcutaneous TID WC Conrad Pacolet, NP   4 Units at 01/22/17 1823  . insulin glargine (LANTUS) injection 20 Units  20 Units Subcutaneous Daily Jolaine Artist, MD   20 Units at 01/22/17 1039  . LORazepam (ATIVAN) tablet 0.5 mg  0.5 mg Oral Q6H PRN Arbutus Leas, NP   0.5 mg at 01/22/17 1026  . losartan (COZAAR) tablet 25 mg  25 mg Oral Daily Amy D Clegg, NP   25 mg at 01/22/17 1023  . magnesium hydroxide (MILK OF MAGNESIA) suspension 30 mL  30 mL Oral Daily PRN Amy D Clegg, NP      . magnesium oxide (MAG-OX) tablet 400 mg  400 mg Oral Daily Amy D Clegg, NP   400 mg at 01/22/17 1022  . milrinone (PRIMACOR) 20 MG/100 ML (0.2 mg/mL) infusion  0.375 mcg/kg/min Intravenous Continuous Jolaine Artist, MD 11.7 mL/hr at 01/22/17 2248 0.375 mcg/kg/min at 01/22/17 2248  . ondansetron (ZOFRAN) injection 4 mg  4 mg Intravenous Q6H PRN Amy D Clegg, NP   4 mg at 01/22/17 1027  . pantoprazole (PROTONIX) EC tablet 40 mg  40 mg Oral Daily Amy D Clegg, NP   40 mg at 01/22/17 1039  . senna-docusate (Senokot-S) tablet 2 tablet  2 tablet Oral Daily Amy D Clegg, NP   2 tablet at 01/22/17 1026  . sildenafil (REVATIO) tablet 20 mg  20 mg Oral TID Conrad Dahlgren, NP   20 mg at 01/22/17 2202  . spironolactone (ALDACTONE) tablet 25 mg  25 mg Oral Daily Amy D Clegg, NP   25 mg at 01/22/17 1023  . tolvaptan (SAMSCA) tablet 15 mg  15 mg Oral Q24H Jolaine Artist, MD   15 mg at 01/22/17 1510  . traMADol (ULTRAM) tablet 50 mg  50 mg Oral Q6H PRN Arbutus Leas, NP   50 mg at 01/22/17 1022  . Warfarin - Pharmacist Dosing Inpatient   Does not apply q1800 Otilio Miu, RPH        Codeine; Trazodone and nefazodone; Lipitor [atorvastatin]; and Tape  REVIEW OF SYSTEMS: All systems negative except as listed in HPI, PMH and Problem list.  Vital Signs:  Doppler Pressure 91                Automatc BP: 91/59 (75) HR:85  SPO2:92  %  Weight: 239 lb w/o eqt Last weight:  246 lb   Wt Readings from Last 3 Encounters:  01/22/17 229 lb 6.4 oz (104.1 kg)  01/13/17 246 lb (111.6 kg)  01/11/17 238 lb (108 kg)    Physical Exam: GENERAL: Fatigued appearing NAD  HEENT: Normal. Anicteric NECK: Supple, JVP 8-9. Carotids 2+ bilaterally, no bruits.  No lymphadenopathy or thyromegaly appreciated.   CARDIAC:  Mechanical heart sounds with + LVAD - normal HM-3 sounds LUNGS:  clear ABDOMEN:  firm, Non tender,  minimally distended No bruits or masses. Good BS LVAD exit site:  Dressing dry and intact.  No drainage or erytthema. Stabilization device present and adequately applied.  Driveline dressing is being changed daily per sterile technique. EXTREMITIES:  Warm. Trace edema. No cyanosis or clubbing NEUROLOGIC:  Alert and oriented x 4.  Gait steady.  No aphasia.  No dysarthria.  Affect pleasant.  Anxious and depressed   ASSESSMENT AND PLAN:  1. Chronic end-stage biventricular systolic HF: LVEF 16% due to ICM -> s/p Echo 08/24/16 LVEF 15%, RV mild dilated, moderately reduced. --> S/P HM3 LVAD 96/04/8937.   - Complicated by RV failure -  Remains NYHA III-IIIB despite VAD support and milrinone at 0.25. Co-ox 74% today - Volume status improved with increased metolazone.Weight down 7 pounds.  Continue torsemide 60/40. Start scheduled metoalzone 2.5 M/W/F. If getting worse will need RHC - Continue sildenafil - Seen yesterday at [facility] - appears transplant w/u may be resuming.  - I worry that depression and anxiety also playing a role in his symptoms.  - VAD parameters stable - Personally reviewed - Warfarin with goal INR 2.0 - 2.5 + ASA 81 daily. Avoid lovenox in future due to epistaxis.  - INR and LDH not checked today (checked 2 days ago)  - Consider adding Entresto at some point.  - Ccontinue routine screens for cocaine and tobacco. Have discussed need for complete abstinence. Seems to be doing well with this.  2. CAD: No CP. Severe 3v- CAD s/p CABG with  occluded grafts as above except for LIMA.  - No s/s of ischemia. Continue current regimen. 3. DMII:  - Hgb A1C 8.3 on 08/26/2016. No change.  4. Tobacco abuse - Says he has not smoked (or used any drugs) since prior to admission for LVAD. UDS remain negative - Will continue routine cotinine screens and UDS as part of transplant w/u as above 5. Atrial fibrillation, paroxysmal - NSR on amio.  - Continue Amiodarone 200 mg for now. Consider stopping soon.  6. Left pleural effusion - underwent repeat thoracentesis in Jan. 2018.  -  Stable L small pleural effusion on 12/01/16 with mild bibasilar atelectasis. 7.  Anxiety/Depression - Continue Celexa 10mg  daily  - I confirmed that he has f/u with a counselor soon.   Bensimhon, Daniel,MD 10:57 PM

## 2017-01-23 ENCOUNTER — Inpatient Hospital Stay (HOSPITAL_COMMUNITY): Payer: Medicare HMO

## 2017-01-23 LAB — BASIC METABOLIC PANEL
Anion gap: 8 (ref 5–15)
Anion gap: 9 (ref 5–15)
BUN: 24 mg/dL — ABNORMAL HIGH (ref 6–20)
BUN: 22 mg/dL — AB (ref 6–20)
CHLORIDE: 77 mmol/L — AB (ref 101–111)
CO2: 32 mmol/L (ref 22–32)
CO2: 31 mmol/L (ref 22–32)
Calcium: 8.7 mg/dL — ABNORMAL LOW (ref 8.9–10.3)
Calcium: 8.4 mg/dL — ABNORMAL LOW (ref 8.9–10.3)
Chloride: 78 mmol/L — ABNORMAL LOW (ref 101–111)
Creatinine, Ser: 1.14 mg/dL (ref 0.61–1.24)
Creatinine, Ser: 1.11 mg/dL (ref 0.61–1.24)
GFR calc Af Amer: >60 mL/min (ref >60)
GFR calc Af Amer: >60 mL/min (ref >60)
GFR calc non Af Amer: >60 mL/min (ref >60)
GFR calc non Af Amer: >60 mL/min (ref >60)
GLUCOSE: 217 mg/dL — AB (ref 65–99)
Glucose, Bld: 214 mg/dL — ABNORMAL HIGH (ref 65–99)
POTASSIUM: 3.6 mmol/L (ref 3.5–5.1)
Potassium: 3.8 mmol/L (ref 3.5–5.1)
Sodium: 118 mmol/L — CL (ref 135–145)
Sodium: 117 mmol/L — CL (ref 135–145)

## 2017-01-23 LAB — CBC
HEMATOCRIT: 28.2 % — AB (ref 39.0–52.0)
HEMOGLOBIN: 8.8 g/dL — AB (ref 13.0–17.0)
MCH: 23.1 pg — ABNORMAL LOW (ref 26.0–34.0)
MCHC: 31.2 g/dL (ref 30.0–36.0)
MCV: 74 fL — AB (ref 78.0–100.0)
Platelets: 202 10*3/uL (ref 150–400)
RBC: 3.81 MIL/uL — AB (ref 4.22–5.81)
RDW: 16.9 % — ABNORMAL HIGH (ref 11.5–15.5)
WBC: 5.8 10*3/uL (ref 4.0–10.5)

## 2017-01-23 LAB — GLUCOSE, CAPILLARY
GLUCOSE-CAPILLARY: 133 mg/dL — AB (ref 65–99)
GLUCOSE-CAPILLARY: 140 mg/dL — AB (ref 65–99)
Glucose-Capillary: 288 mg/dL — ABNORMAL HIGH (ref 65–99)

## 2017-01-23 LAB — COOXEMETRY PANEL
CARBOXYHEMOGLOBIN: 1.2 % (ref 0.5–1.5)
Carboxyhemoglobin: 1.4 % (ref 0.5–1.5)
METHEMOGLOBIN: 1.3 % (ref 0.0–1.5)
Methemoglobin: 1.3 % (ref 0.0–1.5)
O2 SAT: 55.9 %
O2 Saturation: 47.2 %
TOTAL HEMOGLOBIN: 9.6 g/dL — AB (ref 12.0–16.0)
Total hemoglobin: 8.7 g/dL — ABNORMAL LOW (ref 12.0–16.0)

## 2017-01-23 LAB — PROTIME-INR
INR: 2.01
Prothrombin Time: 23.1 seconds — ABNORMAL HIGH (ref 11.4–15.2)

## 2017-01-23 LAB — SODIUM: SODIUM: 118 mmol/L — AB (ref 135–145)

## 2017-01-23 LAB — LACTATE DEHYDROGENASE: LDH: 180 U/L (ref 98–192)

## 2017-01-23 MED ORDER — SODIUM CHLORIDE 0.9% FLUSH: 10.0000 mL | INTRAVENOUS | Status: DC | PRN

## 2017-01-23 MED ORDER — WARFARIN SODIUM 7.5 MG PO TABS
15.0000 mg | ORAL_TABLET | Freq: Once | ORAL | Status: AC
  Administered 2017-01-23: 15 mg via ORAL
  Filled 2017-01-23: qty 2

## 2017-01-23 MED ORDER — TOLVAPTAN 15 MG PO TABS
15.0000 mg | ORAL_TABLET | ORAL | Status: DC
Start: 2017-01-23 — End: 2017-01-23

## 2017-01-23 MED ORDER — SODIUM CHLORIDE 0.9% FLUSH
10.0000 mL | Freq: Two times a day (BID) | INTRAVENOUS | Status: DC
  Administered 2017-01-24 – 2017-01-27 (×4): 10 mL

## 2017-01-23 MED ORDER — FUROSEMIDE 10 MG/ML IJ SOLN
80.0000 mg | Freq: Two times a day (BID) | INTRAMUSCULAR | Status: DC
  Administered 2017-01-23 – 2017-01-27 (×9): 80 mg via INTRAVENOUS
  Filled 2017-01-23 (×9): qty 8

## 2017-01-23 NOTE — Progress Notes (Signed)
CRITICAL VALUE ALERT  Critical value received: NA 118  Date of notification: 01/23/2017  Time of notification: 0600  Critical value read back:Yes.    Nurse who received alert:  A. Shannon Kirkendall,RN  MD notified (1st page): Overton Mam, MD  Time of first page: [phone]  MD notified (2nd page):  Time of second page:  Responding MD: Overton Mam  Time MD responded: [phone]

## 2017-01-23 NOTE — Progress Notes (Signed)
Peripherally Inserted Central Catheter/Midline Placement  The IV Nurse has discussed with the patient and/or persons authorized to consent for the patient, the purpose of this procedure and the potential benefits and risks involved with this procedure.  The benefits include less needle sticks, lab draws from the catheter, and the patient may be discharged home with the catheter. Risks include, but not limited to, infection, bleeding, blood clot (thrombus formation), and puncture of an artery; nerve damage and irregular heartbeat and possibility to perform a PICC exchange if needed/ordered by physician.  Alternatives to this procedure were also discussed.  Bard Power PICC patient education guide, fact sheet on infection prevention and patient information card has been provided to patient /or left at bedside.    PICC/Midline Placement Documentation  PICC Double Lumen 54/49/20 PICC Right Basilic 41 cm 2 cm (Active)  Indication for Insertion or Continuance of Line Vasoactive infusions 01/23/2017  8:00 AM  Site Assessment Clean;Dry;Intact 01/23/2017  8:00 AM  Lumen #1 Status Infusing 01/23/2017  8:00 AM  Lumen #2 Status Flushed;Saline locked;Other (Comment) 01/23/2017  8:00 AM  Dressing Type Transparent;Occlusive 01/23/2017  8:00 AM  Dressing Status Clean;Dry;Intact;Antimicrobial disc in place 01/23/2017  8:00 AM  Line Care Zeroed and calibrated 01/23/2017  8:00 AM  Dressing Change Due 01/25/17 01/23/2017  8:00 AM     PICC Double Lumen 12/14/16 PICC Brachial (Active)     PICC Double Lumen 01/23/17 PICC Right Brachial 42 cm 0 cm (Active)  Indication for Insertion or Continuance of Line Vasoactive infusions 01/23/2017  7:00 PM  Exposed Catheter (cm) 0 cm 01/23/2017  7:00 PM  Site Assessment Clean;Dry;Intact 01/23/2017  7:00 PM  Lumen #1 Status Flushed;Saline locked;Blood return noted 01/23/2017  7:00 PM  Lumen #2 Status Flushed;Saline locked;Blood return noted 01/23/2017  7:00 PM  Dressing Type Transparent  01/23/2017  7:00 PM  Dressing Status Clean;Dry;Antimicrobial disc in place;Intact 01/23/2017  7:00 PM  Line Care Connections checked and tightened 01/23/2017  7:00 PM  Line Adjustment (NICU/IV Team Only) No 01/23/2017  7:00 PM  Dressing Intervention New dressing 01/23/2017  7:00 PM  Dressing Change Due 01/30/17 01/23/2017  7:00 PM       Rolena Infante 01/23/2017, 7:13 PM

## 2017-01-23 NOTE — Progress Notes (Signed)
HeartMate 2 Rounding Note  Subjective:    Milrinone increased to 0.375 yesterday due to low co-ox. Also given a dose of tolvaptan.   Feels better today. Less nauseated. SOB with any activity though. CVP 16 despite IV lasix. Co-ox still 47%   LVAD INTERROGATION:  HeartMate II LVAD:  Flow 5.5 liters/min, speed 5900, power 4.7, PI 3.3.  Multiple PI events  Objective:    Vital Signs:   Temp:  [97.6 F (36.4 C)-98.7 F (37.1 C)] 98.7 F (37.1 C) (04/22 1254) Pulse Rate:  [78-88] 80 (04/22 1254) Resp:  [19-24] 24 (04/22 1254) BP: (87-122)/(52-88) 87/74 (04/22 1254) SpO2:  [92 %] 92 % (04/21 1959) Weight:  [105.1 kg (231 lb 12.8 oz)] 105.1 kg (231 lb 12.8 oz) (04/22 0549)   Mean arterial Pressure 80s  Intake/Output:   Intake/Output Summary (Last 24 hours) at 01/23/17 1358 Last data filed at 01/23/17 1100  Gross per 24 hour  Intake          1321.28 ml  Output             1975 ml  Net          -653.72 ml     Physical Exam: General:  Sitting in chair NAD HEENT: normal anicteric  Neck: supple. JVP to jaw . Carotids 2+ bilat; no bruits. No lymphadenopathy or thryomegaly appreciated. Cor: Mechanical heart sounds with LVAD hum present. Normal HM3 sounds Lungs: clear  Abdomen: soft, NT, mildly distended . No hepatosplenomegaly. No bruits or masses. Good bowel sounds . Driveline: C/D/I; securement device intact and driveline incorporated Extremities: no cyanosis, clubbing, rash, warm 1+ edema  Neuro: alert & orientedx3, cranial nerves grossly intact. moves all 4 extremities w/o difficulty. Affect pleasant  Telemetry: NSR 80s Personally reviewed    Labs: Basic Metabolic Panel:  Recent Labs Lab 01/18/17 1407 01/21/17 1230 01/22/17 0438 01/23/17 0455  NA 120* 119* 118* 118*  K 4.3 3.5 3.3* 3.8  CL 81* 77* 77* 78*  CO2 30 30 34* 32  GLUCOSE 226* 212* 151* 214*  BUN 42* 29* 24* 24*  CREATININE 1.35* 1.13 1.03 1.14  CALCIUM 8.7* 8.7* 8.5* 8.7*  MG  --   --  2.1  --       Liver Function Tests:  Recent Labs Lab 01/21/17 1230  AST 34  ALT 26  ALKPHOS 86  BILITOT 0.9  PROT 7.4  ALBUMIN 3.8   No results for input(s): LIPASE, AMYLASE in the last 168 hours. No results for input(s): AMMONIA in the last 168 hours.  CBC:  Recent Labs Lab 01/18/17 1407 01/21/17 1230 01/22/17 0438 01/23/17 0455  WBC 6.2 7.3 6.8 5.8  NEUTROABS  --   --  5.2  --   HGB 9.6* 9.1* 9.0* 8.8*  HCT 30.4* 28.6* 28.6* 28.2*  MCV 74.9* 73.1* 73.7* 74.0*  PLT 199 226 226 202    INR:  Recent Labs Lab 01/18/17 1407 01/21/17 1421 01/22/17 0438 01/23/17 0455  INR 2.56 2.22 2.20 2.01    Other results:    Imaging: Ct Head Wo Contrast  Result Date: 01/21/2017 CLINICAL DATA:  Headache. Increased abdominal bloating and dyspnea. Left ventricular assist device in place. EXAM: CT HEAD WITHOUT CONTRAST TECHNIQUE: Contiguous axial images were obtained from the base of the skull through the vertex without intravenous contrast. COMPARISON:  None. FINDINGS: Brain: No acute infarct, hemorrhage, or mass lesion is present. The ventricles are of normal size. No significant extraaxial fluid collection is present. The  brainstem and cerebellum are unremarkable. Vascular: Atherosclerotic calcifications are present within the cavernous internal carotid arteries. There is no hyperdense vessel. Skull: The calvarium is intact. No focal lytic or blastic lesions are present. Sinuses/Orbits: The paranasal sinuses and mastoid air cells are clear. 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 No discrete mass lesion is present. The globes are within normal limits bilaterally. IMPRESSION: 1. Normal CT appearance of the brain. 2. Stable bilateral exophthalmos. Electronically Signed   By: San Morelle M.D.   On: 01/21/2017 16:34   Ct Chest Wo Contrast  Result Date: 01/21/2017 CLINICAL DATA:  Acute onset of dyspnea and generalized abdominal bloating. Difficulty sleeping. Initial encounter. EXAM: CT  CHEST WITHOUT CONTRAST TECHNIQUE: Multidetector CT imaging of the chest was performed following the standard protocol without IV contrast. COMPARISON:  Chest radiograph performed earlier today at 2:41 p.m. FINDINGS: Cardiovascular: The heart is mildly enlarged. Diffuse coronary artery calcifications are seen. Postoperative change is seen about the mediastinum. An underlying pacemaker/AICD is noted. There is aneurysmal dilatation of the ascending thoracic aorta to 4.6 cm in AP dimension. Scattered calcification is noted along the aortic arch and proximal great vessels. Mediastinum/Nodes: The patient is status post median sternotomy. A mildly enlarged subcarinal node is seen, measuring 1.3 cm in short axis. No additional mediastinal lymphadenopathy is seen. No pericardial effusion is identified. A 1.7 cm hypodensity is noted at the right thyroid lobe. No axillary lymphadenopathy is seen. Lungs/Pleura: A small left pleural effusion demonstrates surrounding pleural thickening, raising question for a small empyema. Associated left basilar airspace opacity may reflect atelectasis or pneumonia. Would correlate for any clinical evidence for empyema. No pneumothorax is seen.  No dominant mass is identified. Upper Abdomen: The visualized portions of the liver and spleen are unremarkable. The visualized portions of the pancreas are within normal limits. Musculoskeletal: No acute osseous abnormalities are identified. The visualized musculature is unremarkable in appearance. Nonspecific minimally decreased density is noted within vertebral body T9, stable from 2017. IMPRESSION: 1. Small pleural effusion demonstrates surrounding pleural thickening, raising question for a small empyema. Associated left basilar airspace opacity may reflect atelectasis or pneumonia. Would correlate clinically for any evidence of empyema. 2. Mild cardiomegaly.  Diffuse coronary artery calcifications seen. 3. Mildly enlarged 1.3 cm subcarinal node, of  uncertain significance. 4. Aneurysmal dilatation of the ascending thoracic aorta to 4.6 cm in AP dimension. Recommend semi-annual imaging followup by CTA or MRA and referral to cardiothoracic surgery if not already obtained. This recommendation follows 2010 ACCF/AHA/AATS/ACR/ASA/SCA/SCAI/SIR/STS/SVM Guidelines for the Diagnosis and Management of Patients With Thoracic Aortic Disease. Circulation. 2010; 121: e266-e369 5. **An incidental finding of potential clinical significance has been found. 1.7 cm hypodensity at the right thyroid lobe. Consider further evaluation with thyroid ultrasound. If patient is clinically hyperthyroid, consider nuclear medicine thyroid uptake and scan.** Electronically Signed   By: Garald Balding M.D.   On: 01/21/2017 16:59   Dg Chest Port 1 View  Result Date: 01/21/2017 CLINICAL DATA:  Chest pain and shortness of Breath EXAM: PORTABLE CHEST 1 VIEW COMPARISON:  12/01/2016 FINDINGS: Cardiac shadow is enlarged. Defibrillator and LVAD device are noted and stable. Right-sided PICC line is noted at the cavoatrial junction in satisfactory position. No focal infiltrate is noted. IMPRESSION: No acute abnormality noted. Electronically Signed   By: Inez Catalina M.D.   On: 01/21/2017 15:06   Dg Abd Portable 1v  Result Date: 01/22/2017 CLINICAL DATA:  Emesis. EXAM: PORTABLE ABDOMEN - 1 VIEW COMPARISON:  02/24/2016 FINDINGS: Gas and stool are present  in nondilated colon. There is scattered gas in nondistended small bowel without evidence of obstruction. No gross intraperitoneal free air is identified on this supine study. A left ventricular assist device is partially visualized, and sequelae of prior right inguinal hernia repair and cholecystectomy are noted. There is mild multilevel lumbar disc degeneration. IMPRESSION: Nonobstructed bowel gas pattern. Electronically Signed   By: Logan Bores M.D.   On: 01/22/2017 09:12     Medications:     Scheduled Medications: . aspirin EC  81 mg  Oral Daily  . citalopram  10 mg Oral Daily  . digoxin  125 mcg Oral Daily  . docusate sodium  200 mg Oral Daily  . gabapentin  200 mg Oral BID  . insulin aspart  0-20 Units Subcutaneous TID WC  . insulin glargine  20 Units Subcutaneous Daily  . losartan  25 mg Oral Daily  . magnesium oxide  400 mg Oral Daily  . pantoprazole  40 mg Oral Daily  . senna-docusate  2 tablet Oral Daily  . sildenafil  20 mg Oral TID  . spironolactone  25 mg Oral Daily  . tolvaptan  15 mg Oral Q24H  . warfarin  15 mg Oral ONCE-1800  . Warfarin - Pharmacist Dosing Inpatient   Does not apply q1800    Infusions: . milrinone 0.375 mcg/kg/min (01/23/17 1000)    PRN Medications: acetaminophen, LORazepam, magnesium hydroxide, ondansetron (ZOFRAN) IV, traMADol   Assessment/plan:   1. Acute on chronic end stage biventricular systolic HF: LVEF 03% due to ICM.  - s/p HM3 LVAD 47/4259 - Complicated by RV failure requiring milrinone 0.25 at home - Remains extremely tenuous. Co-ox now falling despite milrinone. Milrinone increased to 0.375 yesterday but co-ox still 47%. Will repeat. May need to increase to 0.5 or switch to dobutamine.  - CVP remains up. Continue IV lasix. Give another dose of tolvaptan with NA 118 - Recently seen at [facility] regarding transplant candidacy. May have to expedite this.  - May need Ramp echo and RHC this week.  - LDH 180 2. Chronic anticoagulation - On warfarin + ASA - INR goal is 2.0-2.5 -INR 2.01 today. D/w PharmD regarding dosing  3. CAD s/p CABG - No evidence ischemia  - On ASA 4. PAF  - Maintaining NSR off amio.  5. DM  - Sliding scale ordered.  6. Hyponatremia:  - Na 119-> 118. Asymptomatic. Will fluid restrict. Give another dose tolvaptan. Avoid metoalazone.  7. Severe headache  - Resolved CT reviewed personally. No ICH.    I reviewed the LVAD parameters from today, and compared the results to the patient's prior recorded data.  No programming changes were made.  The  LVAD is functioning within specified parameters.  The patient performs LVAD self-test daily.  LVAD interrogation was negative for any significant power changes, alarms or PI events/speed drops.  LVAD equipment check completed and is in good working order.  Back-up equipment present.   LVAD education done on emergency procedures and precautions and reviewed exit site care.  Length of Stay: 2  Mekesha Solomon 01/23/2017, 1:58 PM  VAD Team --- VAD ISSUES ONLY--- Pager [phone] (7am - 7am)  Advanced Heart Failure Team  Pager [phone] (M-F; 7a - 4p)  Please contact [facility] for night-coverage after hours (4p -7a ) and weekends on [email or web address]

## 2017-01-23 NOTE — Progress Notes (Signed)
RN went to draw 1800 Sodium level from PICC line - found patient sitting in recliner w/PICC line hanging out of place approx 9 inches displaced.  Pt states he has no idea how it became dislodged.  PICC dressing remains intact; anchor in place; "pants" in place; biopatch remains over PICC insertion site.  Dr. Haroldine Laws updated.  PICC team notified for STAT replacement.

## 2017-01-23 NOTE — Progress Notes (Signed)
[place] for Coumadin Indication: LVAD  Allergies  Allergen Reactions  . Codeine Nausea And Vomiting  . Trazodone And Nefazodone Other (See Comments)    Dizziness with subsequent fall   . Lipitor [Atorvastatin] Nausea Only    Nausea with high doses, tolerates 20mg  dose (08/22/16)  . Tape Itching    Patient Measurements: Height: 5\' 9"  (175.3 cm) Weight: 231 lb 12.8 oz (105.1 kg) IBW/kg (Calculated) : 70.7  Vital Signs: Temp: 97.7 F (36.5 C) (04/22 0831) Temp Source: Oral (04/22 0831) BP: 122/88 (04/22 0831) Pulse Rate: 84 (04/22 0831)  Labs:  Recent Labs  01/21/17 1230 01/21/17 1421 01/22/17 0438 01/23/17 0455  HGB 9.1*  --  9.0* 8.8*  HCT 28.6*  --  28.6* 28.2*  PLT 226  --  226 202  LABPROT  --  25.0* 24.8* 23.1*  INR  --  2.22 2.20 2.01  CREATININE 1.13  --  1.03 1.14    Estimated Creatinine Clearance: 82.4 mL/min (by C-G formula based on SCr of 1.14 mg/dL).  Assessment: 61yom s/p LVAD on coumadin. INR therapeutic on admission at 2.22. CBC, LDH continues to stable.  INR this am stable 2, dose not received 4/20 d/t n/v. Plan for possible RHC in am.  Home dose: 15mg  daily except 10mg  on Tuesday  Goal of Therapy:  2-2.5 Monitor platelets by anticoagulation protocol: Yes   Plan:  1) Repeat Coumadin 15mg  tonight 2) Daily INR  Erin Hearing PharmD., BCPS Clinical Pharmacist Pager [phone] 01/23/2017 10:40 AM

## 2017-01-23 NOTE — Progress Notes (Signed)
CRITICAL VALUE ALERT  Critical value received: NA 118  Date of notification: 01/23/2017  Time of notification: 1935  Critical value read back:Yes.    Nurse who received alert:  Erlene Quan, RN  MD notified (1st page):  Kenton Kingfisher, MD  Time of first page:  1940  MD notified (2nd page):  Time of second page:  Responding MD: Kenton Kingfisher, MD  Time MD responded:  [phone]

## 2017-01-23 NOTE — Progress Notes (Signed)
Pt complained of mild numbness in right fingertips of index and middle fingers.  Potential of position of arm during PICC placement.  Moving arm and fingers to restore circulation.  Instructed to notify if continues to have pain, numbness or tingling.  Night shift RN, Lelon Frohlich, notified of complaints.  Instructed to notify VAS Team if persists.

## 2017-01-23 NOTE — Progress Notes (Signed)
CRITICAL VALUE ALERT  Critical value received:  Na 117  Date of notification:  01/23/2017  Time of notification:  2897  Critical value read back:YES  Nurse who received alert:  Fabienne Bruns, RN  MD notified (1st page): Bensimhon  Time of first page: [phone]  MD notified (2nd page):  Time of second page:  Responding MD:  Bensimhon  Time MD responded:  [phone]

## 2017-01-24 ENCOUNTER — Telehealth: Payer: Self-pay | Admitting: *Deleted

## 2017-01-24 ENCOUNTER — Encounter: Payer: Self-pay | Admitting: *Deleted

## 2017-01-24 DIAGNOSIS — I5022 Chronic systolic (congestive) heart failure: Secondary | ICD-10-CM

## 2017-01-24 DIAGNOSIS — Z95811 Presence of heart assist device: Secondary | ICD-10-CM

## 2017-01-24 LAB — COOXEMETRY PANEL
CARBOXYHEMOGLOBIN: 1.3 % (ref 0.5–1.5)
METHEMOGLOBIN: 1.2 % (ref 0.0–1.5)
O2 Saturation: 53.2 %
Total hemoglobin: 8.9 g/dL — ABNORMAL LOW (ref 12.0–16.0)

## 2017-01-24 LAB — CBC
HEMATOCRIT: 28.1 % — AB (ref 39.0–52.0)
Hemoglobin: 8.7 g/dL — ABNORMAL LOW (ref 13.0–17.0)
MCH: 23.1 pg — ABNORMAL LOW (ref 26.0–34.0)
MCHC: 31 g/dL (ref 30.0–36.0)
MCV: 74.7 fL — ABNORMAL LOW (ref 78.0–100.0)
PLATELETS: 179 10*3/uL (ref 150–400)
RBC: 3.76 MIL/uL — ABNORMAL LOW (ref 4.22–5.81)
RDW: 17 % — AB (ref 11.5–15.5)
WBC: 5.9 10*3/uL (ref 4.0–10.5)

## 2017-01-24 LAB — BASIC METABOLIC PANEL
ANION GAP: 8 (ref 5–15)
BUN: 24 mg/dL — ABNORMAL HIGH (ref 6–20)
CHLORIDE: 82 mmol/L — AB (ref 101–111)
CO2: 32 mmol/L (ref 22–32)
Calcium: 8.5 mg/dL — ABNORMAL LOW (ref 8.9–10.3)
Creatinine, Ser: 0.99 mg/dL (ref 0.61–1.24)
GFR calc Af Amer: >60 mL/min (ref >60)
Glucose, Bld: 208 mg/dL — ABNORMAL HIGH (ref 65–99)
POTASSIUM: 3.6 mmol/L (ref 3.5–5.1)
Sodium: 122 mmol/L — ABNORMAL LOW (ref 135–145)

## 2017-01-24 LAB — GLUCOSE, CAPILLARY
GLUCOSE-CAPILLARY: 207 mg/dL — AB (ref 65–99)
GLUCOSE-CAPILLARY: 149 mg/dL — AB (ref 65–99)
GLUCOSE-CAPILLARY: 263 mg/dL — AB (ref 65–99)
Glucose-Capillary: 167 mg/dL — ABNORMAL HIGH (ref 65–99)

## 2017-01-24 LAB — PROTIME-INR
INR: 1.88
Prothrombin Time: 21.8 seconds — ABNORMAL HIGH (ref 11.4–15.2)

## 2017-01-24 LAB — LACTATE DEHYDROGENASE: LDH: 161 U/L (ref 98–192)

## 2017-01-24 MED ORDER — POTASSIUM CHLORIDE CRYS ER 20 MEQ PO TBCR
40.0000 meq | EXTENDED_RELEASE_TABLET | Freq: Once | ORAL | Status: AC
  Administered 2017-01-24: 40 meq via ORAL
  Filled 2017-01-24: qty 2

## 2017-01-24 MED ORDER — WARFARIN SODIUM 7.5 MG PO TABS
17.5000 mg | ORAL_TABLET | Freq: Once | ORAL | Status: AC
  Administered 2017-01-24: 17.5 mg via ORAL
  Filled 2017-01-24: qty 1

## 2017-01-24 NOTE — Progress Notes (Signed)
HeartMate 2 Rounding Note  Subjective:    Coox 53.2% on milrinone 0.375 mcg/kg/min. Na 122 s/p dose of tolvaptan    Feeling better today. Pulled PICC line yesterday. States it got hung up on chair. Now back in.  Mood improved. Denies SOB or CP.   CVP 10 on my personal check at bedside.   LVAD INTERROGATION:  HeartMate II LVAD:  Flow 5.5 liters/min, speed 5900, power 5, PI 3.4. Multiple PI events  Objective:    Vital Signs:   Temp:  [97.8 F (36.6 C)-98.7 F (37.1 C)] 98.5 F (36.9 C) (04/23 0757) Pulse Rate:  [80-82] 82 (04/22 1632) Resp:  [14-24] 14 (04/23 0405) BP: (82-114)/(70-95) 114/95 (04/23 0900) Weight:  [232 lb 8 oz (105.5 kg)] 232 lb 8 oz (105.5 kg) (04/23 0500)   Mean arterial Pressure 70-80s  Intake/Output:   Intake/Output Summary (Last 24 hours) at 01/24/17 0922 Last data filed at 01/24/17 0700  Gross per 24 hour  Intake          1556.43 ml  Output             4150 ml  Net         -2593.57 ml     Physical Exam: General: Well appearing. No resp difficulty. Sittingin chair HEENT: Normal Neck: supple. JVD 10 cm. Carotids 2+ bilat; no bruits. No thyromegaly or nodule noted. Cor: Mechanical heart sounds with LVAD hum present. Normal HM3 sounds.  Lungs: CTAB, normal effort. Abdomen: soft, non-tender, distended, no HSM. No bruits or masses. +BS  Driveline: C/D/I; securement device intact and driveline incorporated.  Extremities: no cyanosis, clubbing, or rash. Warm. Trace to 1+ edema. RUE PICC  Neuro: alert & orientedx3, cranial nerves grossly intact. moves all 4 extremities w/o difficulty. Affect pleasant   Telemetry: Reviewed personally, NSR 80s   Labs: Basic Metabolic Panel:  Recent Labs Lab 01/21/17 1230 01/22/17 0438 01/23/17 0455 01/23/17 1457 01/23/17 1807 01/24/17 0538  NA 119* 118* 118* 117* 118* 122*  K 3.5 3.3* 3.8 3.6  --  3.6  CL 77* 77* 78* 77*  --  82*  CO2 30 34* 32 31  --  32  GLUCOSE 212* 151* 214* 217*  --  208*  BUN 29*  24* 24* 22*  --  24*  CREATININE 1.13 1.03 1.14 1.11  --  0.99  CALCIUM 8.7* 8.5* 8.7* 8.4*  --  8.5*  MG  --  2.1  --   --   --   --     Liver Function Tests:  Recent Labs Lab 01/21/17 1230  AST 34  ALT 26  ALKPHOS 86  BILITOT 0.9  PROT 7.4  ALBUMIN 3.8   No results for input(s): LIPASE, AMYLASE in the last 168 hours. No results for input(s): AMMONIA in the last 168 hours.  CBC:  Recent Labs Lab 01/18/17 1407 01/21/17 1230 01/22/17 0438 01/23/17 0455 01/24/17 0538  WBC 6.2 7.3 6.8 5.8 5.9  NEUTROABS  --   --  5.2  --   --   HGB 9.6* 9.1* 9.0* 8.8* 8.7*  HCT 30.4* 28.6* 28.6* 28.2* 28.1*  MCV 74.9* 73.1* 73.7* 74.0* 74.7*  PLT 199 226 226 202 179    INR:  Recent Labs Lab 01/18/17 1407 01/21/17 1421 01/22/17 0438 01/23/17 0455 01/24/17 0538  INR 2.56 2.22 2.20 2.01 1.88    Other results:    Imaging: Dg Chest Port 1 View  Result Date: 01/23/2017 CLINICAL DATA:  PICC line placement  EXAM: PORTABLE CHEST 1 VIEW COMPARISON:  Stop FINDINGS: The tip of a PICC line from a right-sided approach terminates in the distal SVC-RA juncture. Heart is enlarged. ICD device projects over the left shoulder with leads in the right atrium and right ventricle. A left ventricular assisting device projects over the apex cardiac shadow. The patient is status post CABG. Aortic atherosclerosis without aneurysm identified. Minimal bibasilar atelectasis. No pneumonic consolidation or CHF. No acute osseous appearing abnormality. IMPRESSION: Right-sided PICC line tip is seen at the cavoatrial junction. No acute pulmonary disease. Electronically Signed   By: Ashley Royalty M.D.   On: 01/23/2017 19:39     Medications:     Scheduled Medications: . aspirin EC  81 mg Oral Daily  . citalopram  10 mg Oral Daily  . digoxin  125 mcg Oral Daily  . docusate sodium  200 mg Oral Daily  . furosemide  80 mg Intravenous BID  . gabapentin  200 mg Oral BID  . insulin aspart  0-20 Units Subcutaneous  TID WC  . insulin glargine  20 Units Subcutaneous Daily  . losartan  25 mg Oral Daily  . magnesium oxide  400 mg Oral Daily  . pantoprazole  40 mg Oral Daily  . senna-docusate  2 tablet Oral Daily  . sildenafil  20 mg Oral TID  . sodium chloride flush  10-40 mL Intracatheter Q12H  . spironolactone  25 mg Oral Daily  . tolvaptan  15 mg Oral Q24H  . Warfarin - Pharmacist Dosing Inpatient   Does not apply q1800    Infusions: . milrinone 0.375 mcg/kg/min (01/24/17 0852)    PRN Medications: acetaminophen, LORazepam, magnesium hydroxide, ondansetron (ZOFRAN) IV, sodium chloride flush, traMADol   Assessment/plan:   1. Acute on chronic end stage biventricular systolic HF: LVEF 49% due to ICM.  - s/p HM3 LVAD 70/2637 - Complicated by RV failure requiring milrinone 0.25 at home, and now up to 0.35 - Coox remains marginal/low at 53%. Will increase milrinone to 0.5 mcg/kg/min, and consider transition to dobutamine if still odes not improve.  - CVP remains elevated at 10 today. Will continue IV lasix 80 mg BID at least today.  - Repeat tolvaptan with NA 122  - Recently seen at [facility] regarding transplant candidacy. May have to expedite this.  - May need Ramp echo and RHC this week. Possibly ramp Wednesday after med adjustment.  - LDH 161 2. Chronic anticoagulation - On warfarin + ASA - INR goal is 2.0-2.5 -INR 1.88 this am. Discussed dosing with Pharm D. If drops much lower may need to bridge.   3. CAD s/p CABG - No evidence ischemia. On ASA.  4. PAF  - Remains in NSR off amio.   5. DM  - Continue SSI.   6. Hyponatremia:  - Na 122. Some improvement - Continue free fluid restriction.  7. Severe headache  - Resolved CT reviewed personally. No ICH.   Length of Stay: 3  Annamaria Helling 01/24/2017, 9:22 AM  VAD Team --- VAD ISSUES ONLY--- Pager [phone] (7am - 7am)  Advanced Heart Failure Team  Pager [phone] (M-F; 7a - 4p)  Please contact [facility] for  night-coverage after hours (4p -7a ) and weekends on [email or web address]  Patient seen and examined with the above-signed Advanced Practice Provider and/or Housestaff. I personally reviewed laboratory data, imaging studies and relevant notes. I independently examined the patient and formulated the important aspects of the plan. I have edited the note to reflect  any of my changes or salient points. I have personally discussed the plan with the patient and/or family.  Remains very tenuous. Co-ox low over the weekend and milrinone increased to 0.375. Co-ox marginal today, Will increase milrinone to 0.5. If co-ox does not stabilize will need to consider switch to dobutamine.   Volume status remains elevated. Continue IV lasix.   Serum sodium back to 122. Will give one more does tolvaptan.   VAD parameters stable.   INR 1.8. Continue coumadin. Dosing discussed with PharmD.   Glori Bickers, MD  2:18 PM

## 2017-01-24 NOTE — Telephone Encounter (Signed)
Ronald Miller called to say that he has been readmitted to the hospital again for nausea and vomiting.  We will discharge Ronald Miller at this time and will have him come back once stable to finish his program.

## 2017-01-24 NOTE — Progress Notes (Signed)
LVAD Coordinator Rounding Note:  Admitted 01/21/2017 due to nausea, vomiting, and volume overload.   HeartMate 3 LVAD implated on 09/09/2016 by Dr Darcey Nora.   Vital signs: HR: 82 Doppler Pressure:72 Automatic BP: 114/95 (105) CVP: 12 this AM O2 Sat: 97 on RA Wt: 105.5 kg   LVAD interrogation reveals:  Speed:5900 Flow: 5.4 Power:  5 PI:3.4  Alarms: none Events:  20+ PI events over past 24 hours Fixed speed: 5900 Low speed limit: 5600  Drive Line: Weekly. Due to be changed Tuesday 01/25/2017  Labs:  LDH trend:216>197>180>161  INR trend: 2.22>2.20>2.01>1.88  Anticoagulation Plan: -INR Goal: 2-2.5 -ASA Dose: 81 mg  Adverse Events on VAD: -Started on home Milrinone 11/30/2016  Plan/Recommendations:   1. Possible RAMP and RHC later this week per MD 2. Nursing- please change dressing as scheduled tomorrow 01/25/17 3. Provide emotional support as patient is very tearful and anxious.  Balinda Quails RN, VAD Coordinator 24/7 pager [phone]

## 2017-01-24 NOTE — Telephone Encounter (Signed)
A user error has taken place: encounter opened in error, closed for administrative reasons.

## 2017-01-24 NOTE — Progress Notes (Signed)
Discharge Summary  Patient Details  Name: Ronald Miller. MRN: [id number] Date of Birth: Feb 20, 1956 Referring Provider:     Cardiac Rehab from 11/23/2016 in [name]  Referring Provider  Glori Bickers MD       Number of Visits: 0  Reason for Discharge:  Early Exit:  Re Admission  Smoking History:  History  Smoking Status  . Former Smoker  . Packs/day: 1.50  . Years: 23.00  . Types: Cigarettes  . Quit date: 08/27/2015  Smokeless Tobacco  . Never Used    Diagnosis:  Heart failure, chronic systolic (HCC)  LVAD (left ventricular assist device) present ([place])  ADL UCSD:   Initial Exercise Prescription:     Initial Exercise Prescription - 11/23/16 1000      Date of Initial Exercise RX and Referring Provider   Date 11/23/16   Referring Provider Glori Bickers MD     Treadmill   MPH 1.4   Grade 0.5   Minutes 15   METs 2.07     NuStep   Level 1   Minutes 15   METs 2     REL-XR   Level 1   Minutes 15   METs 2     Prescription Details   Frequency (times per week) 2   Duration Progress to 45 minutes of aerobic exercise without signs/symptoms of physical distress     Intensity   THRR 40-80% of Max Heartrate 100-140   Ratings of Perceived Exertion 11-13   Perceived Dyspnea 0-4     Progression   Progression Continue to progress workloads to maintain intensity without signs/symptoms of physical distress.     Resistance Training   Training Prescription Yes   Weight 3 lbs   Reps 10-15      Discharge Exercise Prescription (Final Exercise Prescription Changes):     Exercise Prescription Changes - 11/23/16 1000      Response to Exercise   Blood Pressure (Admit) --  92 dopplar   Blood Pressure (Exercise) --  90 dopplar   Heart Rate (Admit) 61 bpm   Heart Rate (Exercise) 100 bpm   Oxygen Saturation (Admit) 94 %   Rating of Perceived Exertion (Exercise) 15   Perceived Dyspnea (Exercise) 3   Symptoms SOB      Exercise Review   Progression --  walk test results      Functional Capacity:     6 Minute Walk    Row Name 11/23/16 1030         6 Minute Walk   Phase Initial     Distance 755 feet     Walk Time 4 minutes     # of Rest Breaks 1  30 sec     MPH 2.14     METS 2.61     RPE 15     Perceived Dyspnea  3     VO2 Peak 6.6     Symptoms Yes (comment)     Comments SOB     Resting HR 61 bpm     Resting BP -  92 dopplar     Max Ex. HR 100 bpm     Max Ex. BP -  90 dopplar        Psychological, QOL, Others - Outcomes: PHQ 2/9: Depression screen [facility] 2/9 11/23/2016 05/24/2016 02/03/2016 11/06/2015  Decreased Interest 1 1 0 1  Down, Depressed, Hopeless 1 0 0 2  PHQ - 2 Score 2 1 0 3  Altered sleeping 2 2 - -  Tired, decreased energy 1 1 - -  Change in appetite 0 0 - -  Feeling bad or failure about yourself  2 1 - -  Trouble concentrating 2 0 - -  Moving slowly or fidgety/restless 1 2 - -  Suicidal thoughts 0 0 - 0  PHQ-9 Score 10 7 - -  Difficult doing work/chores Not difficult at all Not difficult at all - -  Some recent data might be hidden    Quality of Life:     Quality of Life - 11/23/16 1039      Quality of Life Scores   Health/Function Pre 19.33 %   Socioeconomic Pre 18.75 %   Psych/Spiritual Pre 21.93 %   Family Pre 29.38 %   GLOBAL Pre 21.05 %      Personal Goals: Goals established at orientation with interventions provided to work toward goal.     Personal Goals and Risk Factors at Admission - 11/18/16 1446      Core Components/Risk Factors/Patient Goals on Admission    Weight Management Yes;Weight Loss   Intervention Weight Management: Develop a combined nutrition and exercise program designed to reach desired caloric intake, while maintaining appropriate intake of nutrient and fiber, sodium and fats, and appropriate energy expenditure required for the weight goal.;Weight Management: Provide education and appropriate resources to help participant work  on and attain dietary goals.   Expected Outcomes Short Term: Continue to assess and modify interventions until short term weight is achieved;Long Term: Adherence to nutrition and physical activity/exercise program aimed toward attainment of established weight goal;Weight Loss: Understanding of general recommendations for a balanced deficit meal plan, which promotes 1-2 lb weight loss per week and includes a negative energy balance of [phone] kcal/d;Understanding recommendations for meals to include 15-35% energy as protein, 25-35% energy from fat, 35-60% energy from carbohydrates, less than 200mg  of dietary cholesterol, 20-35 gm of total fiber daily;Understanding of distribution of calorie intake throughout the day with the consumption of 4-5 meals/snacks   Sedentary Yes   Intervention Provide advice, education, support and counseling about physical activity/exercise needs.;Develop an individualized exercise prescription for aerobic and resistive training based on initial evaluation findings, risk stratification, comorbidities and participant's personal goals.   Expected Outcomes Achievement of increased cardiorespiratory fitness and enhanced flexibility, muscular endurance and strength shown through measurements of functional capacity and personal statement of participant.   Increase Strength and Stamina Yes   Intervention Provide advice, education, support and counseling about physical activity/exercise needs.;Develop an individualized exercise prescription for aerobic and resistive training based on initial evaluation findings, risk stratification, comorbidities and participant's personal goals.   Expected Outcomes Achievement of increased cardiorespiratory fitness and enhanced flexibility, muscular endurance and strength shown through measurements of functional capacity and personal statement of participant.   Improve shortness of breath with ADL's Yes   Intervention Provide education, individualized  exercise plan and daily activity instruction to help decrease symptoms of SOB with activities of daily living.   Expected Outcomes Short Term: Achieves a reduction of symptoms when performing activities of daily living.   Diabetes Yes   Intervention Provide education about signs/symptoms and action to take for hypo/hyperglycemia.;Provide education about proper nutrition, including hydration, and aerobic/resistive exercise prescription along with prescribed medications to achieve blood glucose in normal ranges: Fasting glucose 65-99 mg/dL   Expected Outcomes Short Term: Participant verbalizes understanding of the signs/symptoms and immediate care of hyper/hypoglycemia, proper foot care and importance of medication, aerobic/resistive exercise and  nutrition plan for blood glucose control.;Long Term: Attainment of HbA1C < 7%.   Heart Failure Yes   Intervention Provide a combined exercise and nutrition program that is supplemented with education, support and counseling about heart failure. Directed toward relieving symptoms such as shortness of breath, decreased exercise tolerance, and extremity edema.   Expected Outcomes Improve functional capacity of life;Short term: Attendance in program 2-3 days a week with increased exercise capacity. Reported lower sodium intake. Reported increased fruit and vegetable intake. Reports medication compliance.;Short term: Daily weights obtained and reported for increase. Utilizing diuretic protocols set by physician.;Long term: Adoption of self-care skills and reduction of barriers for early signs and symptoms recognition and intervention leading to self-care maintenance.   Hypertension Yes   Intervention Provide education on lifestyle modifcations including regular physical activity/exercise, weight management, moderate sodium restriction and increased consumption of fresh fruit, vegetables, and low fat dairy, alcohol moderation, and smoking cessation.;Monitor prescription  use compliance.   Expected Outcomes Short Term: Continued assessment and intervention until BP is < 140/75mm HG in hypertensive participants. < 130/58mm HG in hypertensive participants with diabetes, heart failure or chronic kidney disease.;Long Term: Maintenance of blood pressure at goal levels.   Lipids Yes   Intervention Provide education and support for participant on nutrition & aerobic/resistive exercise along with prescribed medications to achieve LDL 70mg , HDL >40mg .   Expected Outcomes Long Term: Cholesterol controlled with medications as prescribed, with individualized exercise RX and with personalized nutrition plan. Value goals: LDL < 70mg , HDL > 40 mg.;Short Term: Participant states understanding of desired cholesterol values and is compliant with medications prescribed. Participant is following exercise prescription and nutrition guidelines.   Stress Yes   Intervention Offer individual and/or small group education and counseling on adjustment to heart disease, stress management and health-related lifestyle change. Teach and support self-help strategies.;Refer participants experiencing significant psychosocial distress to appropriate mental health specialists for further evaluation and treatment. When possible, include family members and significant others in education/counseling sessions.   Expected Outcomes Long Term: Emotional wellbeing is indicated by absence of clinically significant psychosocial distress or social isolation.;Short Term: Participant demonstrates changes in health-related behavior, relaxation and other stress management skills, ability to obtain effective social support, and compliance with psychotropic medications if prescribed.       Personal Goals Discharge:   Nutrition & Weight - Outcomes:     Pre Biometrics - 11/23/16 1036      Pre Biometrics   Height 5\' 10"  (1.778 m)   Weight 230 lb 12.8 oz (104.7 kg)   Waist Circumference 45.5 inches   Hip  Circumference 43.5 inches   Waist to Hip Ratio 1.05 %   BMI (Calculated) 33.2   Single Leg Stand 8.16 seconds       Nutrition:     Nutrition Therapy & Goals - 11/23/16 1036      Nutrition Therapy   Drug/Food Interactions Statins/Certain Fruits     Intervention Plan   Intervention Prescribe, educate and counsel regarding individualized specific dietary modifications aiming towards targeted core components such as weight, hypertension, lipid management, diabetes, heart failure and other comorbidities.   Expected Outcomes Short Term Goal: Understand basic principles of dietary content, such as calories, fat, sodium, cholesterol and nutrients.;Long Term Goal: Adherence to prescribed nutrition plan.;Short Term Goal: A plan has been developed with personal nutrition goals set during dietitian appointment.      Nutrition Discharge:     Nutrition Assessments - 11/23/16 1036      MEDFICTS Scores  Pre Score 6      Education Questionnaire Score:     Knowledge Questionnaire Score - 11/23/16 1035      Knowledge Questionnaire Score   Pre Score 21/28      Goals reviewed with patient; copy given to patient.

## 2017-01-24 NOTE — Progress Notes (Signed)
Ronald Miller for Coumadin Indication: LVAD  Allergies  Allergen Reactions  . Codeine Nausea And Vomiting  . Trazodone And Nefazodone Other (See Comments)    Dizziness with subsequent fall   . Lipitor [Atorvastatin] Nausea Only    Nausea with high doses, tolerates 20mg  dose (08/22/16)  . Tape Itching    Patient Measurements: Height: 5\' 9"  (175.3 cm) Weight: 232 lb 8 oz (105.5 kg) IBW/kg (Calculated) : 70.7  Vital Signs: Temp: 98.5 F (36.9 C) (04/23 0757) Temp Source: Oral (04/23 0757) BP: 114/95 (04/23 0900)  Labs:  Recent Labs  01/22/17 0438 01/23/17 0455 01/23/17 1457 01/24/17 0538  HGB 9.0* 8.8*  --  8.7*  HCT 28.6* 28.2*  --  28.1*  PLT 226 202  --  179  LABPROT 24.8* 23.1*  --  21.8*  INR 2.20 2.01  --  1.88  CREATININE 1.03 1.14 1.11 0.99    Estimated Creatinine Clearance: 94.9 mL/min (by C-G formula based on SCr of 0.99 mg/dL).  Assessment: 61yom s/p LVAD on coumadin. INR therapeutic on admission at 2.22. CBC, LDH continues to stable.  INR fell this am 1.8 on home doses. Has diuresed well could be clearing warfarin faster.  Home dose: 15mg  daily except 10mg  on Tuesday  Goal of Therapy:  2-2.5 Monitor platelets by anticoagulation protocol: Yes   Plan:  1) Coumadin 17.5mg  x1 tonight 2) Daily INR  Bonnita Nasuti Pharm.D. CPP, BCPS Clinical Pharmacist [phone] 01/24/2017 9:42 AM

## 2017-01-24 NOTE — Progress Notes (Signed)
Cardiac Individual Treatment Plan  Patient Details  Name: Ronald Miller. MRN: [id number] Date of Birth: [birth year] Referring Provider:     Cardiac Rehab from 11/23/2016 in [facility] Cardiac and Pulmonary Rehab  Referring Provider  Glori Bickers MD      Initial Encounter Date:    Cardiac Rehab from 11/23/2016 in [facility] Cardiac and Pulmonary Rehab  Date  11/23/16  Referring Provider  Glori Bickers MD      Visit Diagnosis: Heart failure, chronic systolic ([place])  LVAD (left ventricular assist device) present [facility])  Patient's Home Medications on Admission: No current facility-administered medications for this visit.  No current outpatient prescriptions on file.  Facility-Administered Medications Ordered in Other Visits:  .  acetaminophen (TYLENOL) tablet 650 mg, 650 mg, Oral, Q4H PRN, Amy D Clegg, NP, 650 mg at 01/22/17 2206 .  aspirin EC tablet 81 mg, 81 mg, Oral, Daily, Amy D Clegg, NP, 81 mg at 01/24/17 0854 .  citalopram (CELEXA) tablet 10 mg, 10 mg, Oral, Daily, Amy D Clegg, NP, 10 mg at 01/24/17 0855 .  digoxin (LANOXIN) tablet 125 mcg, 125 mcg, Oral, Daily, Amy D Clegg, NP, 125 mcg at 01/24/17 0853 .  docusate sodium (COLACE) capsule 200 mg, 200 mg, Oral, Daily, Amy D Clegg, NP, 200 mg at 01/24/17 0854 .  furosemide (LASIX) injection 80 mg, 80 mg, Intravenous, BID, Jolaine Artist, MD, 80 mg at 01/24/17 0855 .  gabapentin (NEURONTIN) capsule 200 mg, 200 mg, Oral, BID, Amy D Clegg, NP, 200 mg at 01/24/17 0854 .  insulin aspart (novoLOG) injection 0-20 Units, 0-20 Units, Subcutaneous, TID WC, Amy D Clegg, NP, 7 Units at 01/24/17 1348 .  insulin glargine (LANTUS) injection 20 Units, 20 Units, Subcutaneous, Daily, Jolaine Artist, MD, 20 Units at 01/24/17 1025 .  LORazepam (ATIVAN) tablet 0.5 mg, 0.5 mg, Oral, Q6H PRN, Arbutus Leas, NP, 0.5 mg at 01/22/17 1026 .  losartan (COZAAR) tablet 25 mg, 25 mg, Oral, Daily, Amy D Clegg, NP, 25 mg at 01/24/17 0853 .  magnesium  hydroxide (MILK OF MAGNESIA) suspension 30 mL, 30 mL, Oral, Daily PRN, Amy D Clegg, NP .  magnesium oxide (MAG-OX) tablet 400 mg, 400 mg, Oral, Daily, Amy D Clegg, NP, 400 mg at 01/24/17 0855 .  milrinone (PRIMACOR) 20 MG/100 ML (0.2 mg/mL) infusion, 0.5 mcg/kg/min, Intravenous, Continuous, Jolaine Artist, MD, Last Rate: 15.6 mL/hr at 01/24/17 0932, 0.5 mcg/kg/min at 01/24/17 0932 .  ondansetron (ZOFRAN) injection 4 mg, 4 mg, Intravenous, Q6H PRN, Amy D Clegg, NP, 4 mg at 01/22/17 1027 .  pantoprazole (PROTONIX) EC tablet 40 mg, 40 mg, Oral, Daily, Amy D Clegg, NP, 40 mg at 01/24/17 0854 .  senna-docusate (Senokot-S) tablet 2 tablet, 2 tablet, Oral, Daily, Amy D Clegg, NP, 2 tablet at 01/23/17 0939 .  sildenafil (REVATIO) tablet 20 mg, 20 mg, Oral, TID, Amy D Clegg, NP, 20 mg at 01/24/17 0855 .  sodium chloride flush (NS) 0.9 % injection 10-40 mL, 10-40 mL, Intracatheter, Q12H, Shaune Pascal Bensimhon, MD .  sodium chloride flush (NS) 0.9 % injection 10-40 mL, 10-40 mL, Intracatheter, PRN, Jolaine Artist, MD .  spironolactone (ALDACTONE) tablet 25 mg, 25 mg, Oral, Daily, Amy D Clegg, NP, 25 mg at 01/24/17 0853 .  tolvaptan (SAMSCA) tablet 15 mg, 15 mg, Oral, Q24H, Jolaine Artist, MD, 15 mg at 01/24/17 1340 .  traMADol (ULTRAM) tablet 50 mg, 50 mg, Oral, Q6H PRN, Arbutus Leas, NP, 50 mg at 01/23/17 2224 .  warfarin (COUMADIN) tablet 17.5 mg, 17.5 mg, Oral, ONCE-1800, Jolaine Artist, MD .  Warfarin - Pharmacist Dosing Inpatient, , Does not apply, q1800, Otilio Miu, [facility]  Past Medical History: Past Medical History:  Diagnosis Date  . AICD (automatic cardioverter/defibrillator) present   . ASCVD (arteriosclerotic cardiovascular disease)   . Chronic systolic CHF (congestive heart failure) ([place])   . COPD (chronic obstructive pulmonary disease) ([place])   . Coronary artery disease   . Depression   . Diabetes mellitus   . History of cocaine abuse   . Hypertension   . Presence of  permanent cardiac pacemaker   . Shortness of breath dyspnea   . Suicidal ideation   . Tobacco abuse     Tobacco Use: History  Smoking Status  . Former Smoker  . Packs/day: 1.50  . Years: 23.00  . Types: Cigarettes  . Quit date: 08/27/2015  Smokeless Tobacco  . Never Used    Labs: Recent Review Flowsheet Data    Labs for ITP Cardiac and Pulmonary Rehab Latest Ref Rng & Units 01/22/2017 01/22/2017 01/23/2017 01/23/2017 01/24/2017   Cholestrol 0 - 200 mg/dL - - - - -   LDLCALC 0 - 100 mg/dL - - - - -   HDL 40 - 60 mg/dL - - - - -   Trlycerides 0 - 200 mg/dL - - - - -   Hemoglobin A1c 4.8 - 5.6 % - - - - -   PHART 7.350 - 7.450 - - - - -   PCO2ART 32.0 - 48.0 mmHg - - - - -   HCO3 20.0 - 28.0 mmol/L - - - - -   TCO2 0 - 100 mmol/L - - - - -   ACIDBASEDEF 0.0 - 2.0 mmol/L - - - - -   O2SAT % 47.2 53.0 47.2 55.9 53.2       Exercise Target Goals:    Exercise Program Goal: Individual exercise prescription set with THRR, safety & activity barriers. Participant demonstrates ability to understand and report RPE using BORG scale, to self-measure pulse accurately, and to acknowledge the importance of the exercise prescription.  Exercise Prescription Goal: Starting with aerobic activity 30 plus minutes a day, 3 days per week for initial exercise prescription. Provide home exercise prescription and guidelines that participant acknowledges understanding prior to discharge.  Activity Barriers & Risk Stratification:     Activity Barriers & Cardiac Risk Stratification - 11/18/16 1447      Activity Barriers & Cardiac Risk Stratification   Activity Barriers Deconditioning;Muscular Weakness;Shortness of Breath;Decreased Ventricular Function;Back Problems   Cardiac Risk Stratification High      6 Minute Walk:     6 Minute Walk    Row Name 11/23/16 1030         6 Minute Walk   Phase Initial     Distance 755 feet     Walk Time 4 minutes     # of Rest Breaks 1  30 sec     MPH  2.14     METS 2.61     RPE 15     Perceived Dyspnea  3     VO2 Peak 6.6     Symptoms Yes (comment)     Comments SOB     Resting HR 61 bpm     Resting BP -  92 dopplar     Max Ex. HR 100 bpm     Max Ex. BP -  90 dopplar  Oxygen Initial Assessment:   Oxygen Re-Evaluation:   Oxygen Discharge (Final Oxygen Re-Evaluation):   Initial Exercise Prescription:     Initial Exercise Prescription - 11/23/16 1000      Date of Initial Exercise RX and Referring Provider   Date 11/23/16   Referring Provider Glori Bickers MD     Treadmill   MPH 1.4   Grade 0.5   Minutes 15   METs 2.07     NuStep   Level 1   Minutes 15   METs 2     REL-XR   Level 1   Minutes 15   METs 2     Prescription Details   Frequency (times per week) 2   Duration Progress to 45 minutes of aerobic exercise without signs/symptoms of physical distress     Intensity   THRR 40-80% of Max Heartrate 100-140   Ratings of Perceived Exertion 11-13   Perceived Dyspnea 0-4     Progression   Progression Continue to progress workloads to maintain intensity without signs/symptoms of physical distress.     Resistance Training   Training Prescription Yes   Weight 3 lbs   Reps 10-15      Perform Capillary Blood Glucose checks as needed.  Exercise Prescription Changes:     Exercise Prescription Changes    Row Name 11/23/16 1000             Response to Exercise   Blood Pressure (Admit) -  92 dopplar       Blood Pressure (Exercise) -  90 dopplar       Heart Rate (Admit) 61 bpm       Heart Rate (Exercise) 100 bpm       Oxygen Saturation (Admit) 94 %       Rating of Perceived Exertion (Exercise) 15       Perceived Dyspnea (Exercise) 3       Symptoms SOB         Exercise Review   Progression -  walk test results          Exercise Comments:     Exercise Comments    Row Name 11/24/16 1516 11/25/16 1029 11/25/16 1030       Exercise Comments [name] returned to rehab to complete  his sessions.  He started back on session number 5.  He completed his 6MWT to reset his workloads.  We will continue to monitor his progression. Dizzy and lightheaded on Treadmill.  Dopplar BP was 96 which was up from 90 at check in and 86 after the NuStep.  He was given some water and rested.  He also complained SOB where he felt like he just could not take a deep breath and had some gas trapped (burping some).  He was on his way to see Dr. Haroldine Laws for a follow up appt today.  First full day of exercise!  Patient was oriented to gym and equipment including functions, settings, policies, and procedures.  Patient's individual exercise prescription and treatment plan were reviewed.  All starting workloads were established based on the results of the 6 minute walk test done at initial orientation visit.  The plan for exercise progression was also introduced and progression will be customized based on patient's performance and goals.        Exercise Goals and Review:   Exercise Goals Re-Evaluation :   Discharge Exercise Prescription (Final Exercise Prescription Changes):     Exercise Prescription Changes - 11/23/16 1000  Response to Exercise   Blood Pressure (Admit) --  92 dopplar   Blood Pressure (Exercise) --  90 dopplar   Heart Rate (Admit) 61 bpm   Heart Rate (Exercise) 100 bpm   Oxygen Saturation (Admit) 94 %   Rating of Perceived Exertion (Exercise) 15   Perceived Dyspnea (Exercise) 3   Symptoms SOB     Exercise Review   Progression --  walk test results      Nutrition:  Target Goals: Understanding of nutrition guidelines, daily intake of sodium <1566m, cholesterol <2062m calories 30% from fat and 7% or less from saturated fats, daily to have 5 or more servings of fruits and vegetables.  Biometrics:     Pre Biometrics - 11/23/16 1036      Pre Biometrics   Height 5' 10" (1.778 m)   Weight 230 lb 12.8 oz (104.7 kg)   Waist Circumference 45.5 inches   Hip  Circumference 43.5 inches   Waist to Hip Ratio 1.05 %   BMI (Calculated) 33.2   Single Leg Stand 8.16 seconds       Nutrition Therapy Plan and Nutrition Goals:     Nutrition Therapy & Goals - 11/23/16 1036      Nutrition Therapy   Drug/Food Interactions Statins/Certain Fruits     Intervention Plan   Intervention Prescribe, educate and counsel regarding individualized specific dietary modifications aiming towards targeted core components such as weight, hypertension, lipid management, diabetes, heart failure and other comorbidities.   Expected Outcomes Short Term Goal: Understand basic principles of dietary content, such as calories, fat, sodium, cholesterol and nutrients.;Long Term Goal: Adherence to prescribed nutrition plan.;Short Term Goal: A plan has been developed with personal nutrition goals set during dietitian appointment.      Nutrition Discharge: Rate Your Plate Scores:     Nutrition Assessments - 11/23/16 1036      MEDFICTS Scores   Pre Score 6      Nutrition Goals Re-Evaluation:   Nutrition Goals Discharge (Final Nutrition Goals Re-Evaluation):   Psychosocial: Target Goals: Acknowledge presence or absence of significant depression and/or stress, maximize coping skills, provide positive support system. Participant is able to verbalize types and ability to use techniques and skills needed for reducing stress and depression.   Initial Review & Psychosocial Screening:     Initial Psych Review & Screening - 11/23/16 1036      Initial Review   Current issues with Current Depression;Current Anxiety/Panic;History of Depression     Family Dynamics   Good Support System? Yes  mother and sister   Comments New to recognizing his depression since his LVAD.  Was rejected from transplant list current for cocaine in urine (states he has been clean).  Still getting used to LVAD device.     Barriers   Psychosocial barriers to participate in program The patient should  benefit from training in stress management and relaxation.;Psychosocial barriers identified (see note)     Screening Interventions   Interventions Encouraged to exercise;Program counselor consult      Quality of Life Scores:      Quality of Life - 11/23/16 1039      Quality of Life Scores   Health/Function Pre 19.33 %   Socioeconomic Pre 18.75 %   Psych/Spiritual Pre 21.93 %   Family Pre 29.38 %   GLOBAL Pre 21.05 %      PHQ-9: Recent Review Flowsheet Data    [facility] 11/23/2016 05/24/2016 02/03/2016 11/06/2015   Decreased Interest 1  1 0 1   Down, Depressed, Hopeless 1 0 0 2   PHQ - 2 Score 2 1 0 3   Altered sleeping 2 2 - -   Tired, decreased energy 1 1 - -   Change in appetite 0 0 - -   Feeling bad or failure about yourself  2 1 - -   Trouble concentrating 2 0 - -   Moving slowly or fidgety/restless 1 2 - -   Suicidal thoughts 0 0 - 0   PHQ-9 Score 10 7 - -   Difficult doing work/chores Not difficult at all Not difficult at all - -     Interpretation of Total Score  Total Score Depression Severity:  1-4 = Minimal depression, 5-9 = Mild depression, 10-14 = Moderate depression, 15-19 = Moderately severe depression, 20-27 = Severe depression   Psychosocial Evaluation and Intervention:     Psychosocial Evaluation - 11/23/16 0924      Psychosocial Evaluation & Interventions   Interventions Stress management education;Relaxation education;Encouraged to exercise with the program and follow exercise prescription   Comments Counselor met with Mr. Chicoine today for initial psychosocial evaluation.  He is a 61 year old who had an LVAD procedure several months ago.  He has a strong support system with lots of family close by and he currently lives with his sister and her husband.  Mr. John also has an adult daughter who lives locally and checks on him often by phone.  He has an extensive history of heart disease with 6 by passes 20 years ago and he also is Type 2  Diabetic.  He reports not sleeping well since the surgery in December until last night subsequent to Dr. prescribing a new medication to help with this.  He denies a history of depression or anxiety but states his Dr. thinks he "might be depressed."  Mr. Delaughter claims he is typically in a good mood and kids around with others quite a bit.  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  He is on the heart transplant list and quit smoking in November (several months ago) so hoping to get a heart in the near future.  Staff and counselor will be monitoring Mr. Nadel throughout the course of this program.        Psychosocial Re-Evaluation:   Psychosocial Discharge (Final Psychosocial Re-Evaluation):   Vocational Rehabilitation: Provide vocational rehab assistance to qualifying candidates.   Vocational Rehab Evaluation & Intervention:     Vocational Rehab - 11/23/16 1035      Initial Vocational Rehab Evaluation & Intervention   Assessment shows need for Vocational Rehabilitation No      Education: Education Goals: Education classes will be provided on a weekly basis, covering required topics. Participant will state understanding/return demonstration of topics presented.  Learning Barriers/Preferences:     Learning Barriers/Preferences - 11/23/16 1035      Learning Barriers/Preferences   Learning Barriers Exercise Concerns   Learning Preferences None      Education Topics: General Nutrition Guidelines/Fats and Fiber: -Group instruction provided by verbal, written material, models and posters to present the general guidelines for heart healthy nutrition. Gives an explanation and review of dietary fats and fiber.   Controlling Sodium/Reading Food Labels: -Group verbal and written material supporting the discussion of sodium use in heart healthy nutrition. Review and explanation with models, verbal and written materials for  utilization of the food label.  Exercise Physiology & Risk Factors: - Group verbal and written instruction with models to review the exercise physiology of the cardiovascular system and associated critical values. Details cardiovascular disease risk factors and the goals associated with each risk factor.   Aerobic Exercise & Resistance Training: - Gives group verbal and written discussion on the health impact of inactivity. On the components of aerobic and resistive training programs and the benefits of this training and how to safely progress through these programs.   Flexibility, Balance, General Exercise Guidelines: - Provides group verbal and written instruction on the benefits of flexibility and balance training programs. Provides general exercise guidelines with specific guidelines to those with heart or lung disease. Demonstration and skill practice provided.   Stress Management: - Provides group verbal and written instruction about the health risks of elevated stress, cause of high stress, and healthy ways to reduce stress.   Depression: - Provides group verbal and written instruction on the correlation between heart/lung disease and depressed mood, treatment options, and the stigmas associated with seeking treatment.   Anatomy & Physiology of the Heart: - Group verbal and written instruction and models provide basic cardiac anatomy and physiology, with the coronary electrical and arterial systems. Review of: AMI, Angina, Valve disease, Heart Failure, Cardiac Arrhythmia, Pacemakers, and the ICD.   Cardiac Procedures: - Group verbal and written instruction and models to describe the testing methods done to diagnose heart disease. Reviews the outcomes of the test results. Describes the treatment choices: Medical Management, Angioplasty, or Coronary Bypass Surgery.   Cardiac Medications: - Group verbal and written instruction to review commonly prescribed medications for heart  disease. Reviews the medication, class of the drug, and side effects. Includes the steps to properly store meds and maintain the prescription regimen.   Cardiac Rehab from 11/23/2016 in [facility] Cardiac and Pulmonary Rehab  Date  11/23/16  Educator  SB  Instruction Review Code  2- meets goals/outcomes      Go Sex-Intimacy & Heart Disease, Get SMART - Goal Setting: - Group verbal and written instruction through game format to discuss heart disease and the return to sexual intimacy. Provides group verbal and written material to discuss and apply goal setting through the application of the S.M.A.R.T. Method.   Other Matters of the Heart: - Provides group verbal, written materials and models to describe Heart Failure, Angina, Valve Disease, and Diabetes in the realm of heart disease. Includes description of the disease process and treatment options available to the cardiac patient.   Exercise & Equipment Safety: - Individual verbal instruction and demonstration of equipment use and safety with use of the equipment.   Cardiac Rehab from 11/23/2016 in [facility] Cardiac and Pulmonary Rehab  Date  11/23/16  Educator  CE  Instruction Review Code  2- meets goals/outcomes      Infection Prevention: - Provides verbal and written material to individual with discussion of infection control including proper hand washing and proper equipment cleaning during exercise session.   Cardiac Rehab from 11/23/2016 in [facility] Cardiac and Pulmonary Rehab  Date  11/23/16  Educator  CE  Instruction Review Code  2- meets goals/outcomes      Falls Prevention: - Provides verbal and written material to individual with discussion of falls prevention and safety.   Cardiac Rehab from 11/23/2016 in [facility] Cardiac and Pulmonary Rehab  Date  11/23/16  Educator  CE  Instruction Review Code  2- meets goals/outcomes      Diabetes: - Individual verbal and written instruction  to review signs/symptoms of diabetes, desired ranges of  glucose level fasting, after meals and with exercise. Advice that pre and post exercise glucose checks will be done for 3 sessions at entry of program.   Cardiac Rehab from 11/23/2016 in [facility] Cardiac and Pulmonary Rehab  Date  11/23/16  Educator  CE  Instruction Review Code  2- meets goals/outcomes       Knowledge Questionnaire Score:     Knowledge Questionnaire Score - 11/23/16 1035      Knowledge Questionnaire Score   Pre Score 21/28      Core Components/Risk Factors/Patient Goals at Admission:     Personal Goals and Risk Factors at Admission - 11/18/16 1446      Core Components/Risk Factors/Patient Goals on Admission    Weight Management Yes;Weight Loss   Intervention Weight Management: Develop a combined nutrition and exercise program designed to reach desired caloric intake, while maintaining appropriate intake of nutrient and fiber, sodium and fats, and appropriate energy expenditure required for the weight goal.;Weight Management: Provide education and appropriate resources to help participant work on and attain dietary goals.   Expected Outcomes Short Term: Continue to assess and modify interventions until short term weight is achieved;Long Term: Adherence to nutrition and physical activity/exercise program aimed toward attainment of established weight goal;Weight Loss: Understanding of general recommendations for a balanced deficit meal plan, which promotes 1-2 lb weight loss per week and includes a negative energy balance of [phone] kcal/d;Understanding recommendations for meals to include 15-35% energy as protein, 25-35% energy from fat, 35-60% energy from carbohydrates, less than 221m of dietary cholesterol, 20-35 gm of total fiber daily;Understanding of distribution of calorie intake throughout the day with the consumption of 4-5 meals/snacks   Sedentary Yes   Intervention Provide advice, education, support and counseling about physical activity/exercise needs.;Develop an  individualized exercise prescription for aerobic and resistive training based on initial evaluation findings, risk stratification, comorbidities and participant's personal goals.   Expected Outcomes Achievement of increased cardiorespiratory fitness and enhanced flexibility, muscular endurance and strength shown through measurements of functional capacity and personal statement of participant.   Increase Strength and Stamina Yes   Intervention Provide advice, education, support and counseling about physical activity/exercise needs.;Develop an individualized exercise prescription for aerobic and resistive training based on initial evaluation findings, risk stratification, comorbidities and participant's personal goals.   Expected Outcomes Achievement of increased cardiorespiratory fitness and enhanced flexibility, muscular endurance and strength shown through measurements of functional capacity and personal statement of participant.   Improve shortness of breath with ADL's Yes   Intervention Provide education, individualized exercise plan and daily activity instruction to help decrease symptoms of SOB with activities of daily living.   Expected Outcomes Short Term: Achieves a reduction of symptoms when performing activities of daily living.   Diabetes Yes   Intervention Provide education about signs/symptoms and action to take for hypo/hyperglycemia.;Provide education about proper nutrition, including hydration, and aerobic/resistive exercise prescription along with prescribed medications to achieve blood glucose in normal ranges: Fasting glucose 65-99 mg/dL   Expected Outcomes Short Term: Participant verbalizes understanding of the signs/symptoms and immediate care of hyper/hypoglycemia, proper foot care and importance of medication, aerobic/resistive exercise and nutrition plan for blood glucose control.;Long Term: Attainment of HbA1C < 7%.   Heart Failure Yes   Intervention Provide a combined exercise  and nutrition program that is supplemented with education, support and counseling about heart failure. Directed toward relieving symptoms such as shortness of breath, decreased exercise tolerance, and extremity  edema.   Expected Outcomes Improve functional capacity of life;Short term: Attendance in program 2-3 days a week with increased exercise capacity. Reported lower sodium intake. Reported increased fruit and vegetable intake. Reports medication compliance.;Short term: Daily weights obtained and reported for increase. Utilizing diuretic protocols set by physician.;Long term: Adoption of self-care skills and reduction of barriers for early signs and symptoms recognition and intervention leading to self-care maintenance.   Hypertension Yes   Intervention Provide education on lifestyle modifcations including regular physical activity/exercise, weight management, moderate sodium restriction and increased consumption of fresh fruit, vegetables, and low fat dairy, alcohol moderation, and smoking cessation.;Monitor prescription use compliance.   Expected Outcomes Short Term: Continued assessment and intervention until BP is < 140/8m HG in hypertensive participants. < 130/826mHG in hypertensive participants with diabetes, heart failure or chronic kidney disease.;Long Term: Maintenance of blood pressure at goal levels.   Lipids Yes   Intervention Provide education and support for participant on nutrition & aerobic/resistive exercise along with prescribed medications to achieve LDL <7072mHDL >58m98m Expected Outcomes Long Term: Cholesterol controlled with medications as prescribed, with individualized exercise RX and with personalized nutrition plan. Value goals: LDL < 70mg59mL > 40 mg.;Short Term: Participant states understanding of desired cholesterol values and is compliant with medications prescribed. Participant is following exercise prescription and nutrition guidelines.   Stress Yes   Intervention  Offer individual and/or small group education and counseling on adjustment to heart disease, stress management and health-related lifestyle change. Teach and support self-help strategies.;Refer participants experiencing significant psychosocial distress to appropriate mental health specialists for further evaluation and treatment. When possible, include family members and significant others in education/counseling sessions.   Expected Outcomes Long Term: Emotional wellbeing is indicated by absence of clinically significant psychosocial distress or social isolation.;Short Term: Participant demonstrates changes in health-related behavior, relaxation and other stress management skills, ability to obtain effective social support, and compliance with psychotropic medications if prescribed.      Core Components/Risk Factors/Patient Goals Review:    Core Components/Risk Factors/Patient Goals at Discharge (Final Review):    ITP Comments:     ITP Comments    Row Name 11/18/16 1422 11/23/16 1029 12/01/16 0604 12/02/16 0848 12/21/16 1535   ITP Comments Called [name] to discuss having him return to Cardiac Rehab.  He would like to come to the Tues/Thurs 8 am class.  He will be restarting on Feb 20.  He will have a new referral and start on visit number 5 to complete his rehab for heart failure. [name] returned to rehab today.  ITP resumed with new addition of LVAD to his CHF diagnosis.  [facility] 30 day review. Continue with ITP unless directed changes per Medical Director review. Has returned to complete sessions after LVAD placement [name] was admitted from clinic with 15lbs weight gain and dyspnea on 11/30/16.  We will wait for clearance to return.  We will monitor her progress as inpatient to outpatient. Called to let [name] that Dr. BensiHaroldine Lawscleared him to return.  He will return on Thursday.   [place] 12/29/16 [phone] [id number] 1558 01/19/17 1551 01/20/17 1534 01/24/17 1555   ITP Comments  30 day review. Continue with ITP unless directed changes per Medical Director review  LAst visit 11/25/2016 Called [name] to check on status of return.  He plans to return to rehab tomorrow.  He was seen in clinic today and was encouraged to stay active. Called to check on [name]'s  status for rehab.  Left message on voicemail. Returning his call.  He has clearance to return to rehab.  He is having anxiety attacks and back pain.  He is going to to aim to return on Tuesday! [name] called to say that he has been readmitted to the hospital again for nausea and vomiting.  We will discharge [name] at this time and will have him come back once stable to finish his program.      Comments: Discharge ITP

## 2017-01-25 LAB — GLUCOSE, CAPILLARY
GLUCOSE-CAPILLARY: 222 mg/dL — AB (ref 65–99)
GLUCOSE-CAPILLARY: 200 mg/dL — AB (ref 65–99)
GLUCOSE-CAPILLARY: 263 mg/dL — AB (ref 65–99)
GLUCOSE-CAPILLARY: 173 mg/dL — AB (ref 65–99)

## 2017-01-25 LAB — CBC
HEMATOCRIT: 28 % — AB (ref 39.0–52.0)
Hemoglobin: 8.9 g/dL — ABNORMAL LOW (ref 13.0–17.0)
MCH: 23.9 pg — AB (ref 26.0–34.0)
MCHC: 31.8 g/dL (ref 30.0–36.0)
MCV: 75.3 fL — AB (ref 78.0–100.0)
Platelets: 183 10*3/uL (ref 150–400)
RBC: 3.72 MIL/uL — ABNORMAL LOW (ref 4.22–5.81)
RDW: 17.3 % — AB (ref 11.5–15.5)
WBC: 5.5 10*3/uL (ref 4.0–10.5)

## 2017-01-25 LAB — BASIC METABOLIC PANEL
Anion gap: 9 (ref 5–15)
BUN: 26 mg/dL — AB (ref 6–20)
CO2: 33 mmol/L — ABNORMAL HIGH (ref 22–32)
CREATININE: 0.98 mg/dL (ref 0.61–1.24)
Calcium: 8.5 mg/dL — ABNORMAL LOW (ref 8.9–10.3)
Chloride: 81 mmol/L — ABNORMAL LOW (ref 101–111)
GFR calc Af Amer: >60 mL/min (ref >60)
Glucose, Bld: 223 mg/dL — ABNORMAL HIGH (ref 65–99)
Potassium: 4.3 mmol/L (ref 3.5–5.1)
SODIUM: 123 mmol/L — AB (ref 135–145)

## 2017-01-25 LAB — LACTATE DEHYDROGENASE: LDH: 187 U/L (ref 98–192)

## 2017-01-25 LAB — COOXEMETRY PANEL
Carboxyhemoglobin: 0.7 % (ref 0.5–1.5)
Carboxyhemoglobin: 1.5 % (ref 0.5–1.5)
Methemoglobin: 2.5 % — ABNORMAL HIGH (ref 0.0–1.5)
Methemoglobin: 1.2 % (ref 0.0–1.5)
O2 SAT: 69.2 %
O2 Saturation: 47.9 %
TOTAL HEMOGLOBIN: 8.1 g/dL — AB (ref 12.0–16.0)
Total hemoglobin: 9.2 g/dL — ABNORMAL LOW (ref 12.0–16.0)

## 2017-01-25 LAB — HEPARIN LEVEL (UNFRACTIONATED): Heparin Unfractionated: 0.29 IU/mL — ABNORMAL LOW (ref 0.30–0.70)

## 2017-01-25 LAB — PROTIME-INR
INR: 1.66
Prothrombin Time: 19.8 seconds — ABNORMAL HIGH (ref 11.4–15.2)

## 2017-01-25 MED ORDER — WARFARIN SODIUM 10 MG PO TABS
20.0000 mg | ORAL_TABLET | Freq: Once | ORAL | Status: AC
  Administered 2017-01-25: 20 mg via ORAL
  Filled 2017-01-25: qty 2

## 2017-01-25 MED ORDER — DOBUTAMINE IN D5W 4-5 MG/ML-% IV SOLN
INTRAVENOUS | Status: AC
  Filled 2017-01-25: qty 250

## 2017-01-25 MED ORDER — DOBUTAMINE IN D5W 4-5 MG/ML-% IV SOLN
5.0000 ug/kg/min | INTRAVENOUS | Status: DC
  Administered 2017-01-25 – 2017-01-27 (×3): 5 ug/kg/min via INTRAVENOUS
  Filled 2017-01-25 (×2): qty 250

## 2017-01-25 MED ORDER — HEPARIN (PORCINE) IN NACL 100-0.45 UNIT/ML-% IJ SOLN
1600.0000 [IU]/h | INTRAMUSCULAR | Status: DC
  Administered 2017-01-25: 1500 [IU]/h via INTRAVENOUS
  Administered 2017-01-25: 1600 [IU]/h via INTRAVENOUS
  Filled 2017-01-25 (×3): qty 250

## 2017-01-25 NOTE — Progress Notes (Signed)
  Coox improved to 69% with switch to dobutamine.    Legrand Como [address]" [place], PA-C 01/25/2017 11:40 AM

## 2017-01-25 NOTE — Care Management Note (Signed)
Case Management Note  Patient Details  Name: Jaziah Goeller. MRN: [id number] Date of Birth: 04-06-1956  Subjective/Objective:     Pt admitted with HF - LVAD installed in 2017               Action/Plan:   PTA independent from home active with [facility] for IV milrinone.  LVAD SW closely following    Expected Discharge Date:  01/28/17               Expected Discharge Plan:  [place]  In-House Referral:  Clinical Social Work  Discharge planning Services  CM Consult  Post Acute Care Choice:    Choice offered to:     DME Arranged:    DME Agency:     HH Arranged:    HH Agency:     Status of Service:     If discussed at [employer], dates discussed:    Additional Comments: Pt has been switched to dobutamine with improved co-ox Maryclare Labrador, RN 01/25/2017, 2:48 PM

## 2017-01-25 NOTE — Progress Notes (Signed)
HeartMate 2 Rounding Note  Subjective:    Feels much better. Breathing better. Nausea resolved. Arm sore where PICC line replaced.   On milrinone 0.5 and IV lasix. Co-ox back down to 47% CVP 9 NA up to 123   LVAD INTERROGATION:  HeartMate II LVAD:  Flow 5.6 liters/min, speed 5900, power 4.8, PI 3.4. Occasional PI events  Objective:    Vital Signs:   Temp:  [97.8 F (36.6 C)-98.5 F (36.9 C)] 98.1 F (36.7 C) (04/23 2013) Pulse Rate:  [67-94] 92 (04/24 0600) Resp:  [14-19] 18 (04/24 0600) BP: (94-137)/(72-120) 94/72 (04/24 0007) SpO2:  [91 %-94 %] 91 % (04/24 0600) Weight:  [106.2 kg (234 lb 1.6 oz)] 106.2 kg (234 lb 1.6 oz) (04/24 0500)   Mean arterial Pressure 70-80  Intake/Output:   Intake/Output Summary (Last 24 hours) at 01/25/17 0631 Last data filed at 01/25/17 0600  Gross per 24 hour  Intake          1223.05 ml  Output             4980 ml  Net         -3756.95 ml     Physical Exam: General: Sitting in chair Neck: supple. JVP 8-9  cm. Carotids 2+ bilat; no bruits. No thyromegaly or nodule noted. Cor: Mechanical heart sounds with LVAD hum present. Normal HM 3 sounds Lungs: clear  Abdomen: soft NT/ND good BS Driveline: C/D/I; securement device intact and driveline incorporated.  Site clear  Extremities: No c/c. Trace edema. Warm. PICC site ok  Neuro: alert & orientedx3, cranial nerves grossly intact. moves all 4 extremities w/o difficulty. Affect pleasant   Telemetry: NSR 80s Personally reviewed   Labs: Basic Metabolic Panel:  Recent Labs Lab 01/22/17 0438 01/23/17 0455 01/23/17 1457 01/23/17 1807 01/24/17 0538 01/25/17 0509  NA 118* 118* 117* 118* 122* 123*  K 3.3* 3.8 3.6  --  3.6 4.3  CL 77* 78* 77*  --  82* 81*  CO2 34* 32 31  --  32 33*  GLUCOSE 151* 214* 217*  --  208* 223*  BUN 24* 24* 22*  --  24* 26*  CREATININE 1.03 1.14 1.11  --  0.99 0.98  CALCIUM 8.5* 8.7* 8.4*  --  8.5* 8.5*  MG 2.1  --   --   --   --   --     Liver  Function Tests:  Recent Labs Lab 01/21/17 1230  AST 34  ALT 26  ALKPHOS 86  BILITOT 0.9  PROT 7.4  ALBUMIN 3.8   No results for input(s): LIPASE, AMYLASE in the last 168 hours. No results for input(s): AMMONIA in the last 168 hours.  CBC:  Recent Labs Lab 01/21/17 1230 01/22/17 0438 01/23/17 0455 01/24/17 0538 01/25/17 0509  WBC 7.3 6.8 5.8 5.9 5.5  NEUTROABS  --  5.2  --   --   --   HGB 9.1* 9.0* 8.8* 8.7* 8.9*  HCT 28.6* 28.6* 28.2* 28.1* 28.0*  MCV 73.1* 73.7* 74.0* 74.7* 75.3*  PLT 226 226 202 179 183    INR:  Recent Labs Lab 01/21/17 1421 01/22/17 0438 01/23/17 0455 01/24/17 0538 01/25/17 0509  INR 2.22 2.20 2.01 1.88 1.66    Other results:    Imaging: Dg Chest Port 1 View  Result Date: 01/23/2017 CLINICAL DATA:  PICC line placement EXAM: PORTABLE CHEST 1 VIEW COMPARISON:  Stop FINDINGS: The tip of a PICC line from a right-sided approach terminates in the  distal SVC-RA juncture. Heart is enlarged. ICD device projects over the left shoulder with leads in the right atrium and right ventricle. A left ventricular assisting device projects over the apex cardiac shadow. The patient is status post CABG. Aortic atherosclerosis without aneurysm identified. Minimal bibasilar atelectasis. No pneumonic consolidation or CHF. No acute osseous appearing abnormality. IMPRESSION: Right-sided PICC line tip is seen at the cavoatrial junction. No acute pulmonary disease. Electronically Signed   By: Ashley Royalty M.D.   On: 01/23/2017 19:39     Medications:     Scheduled Medications: . aspirin EC  81 mg Oral Daily  . citalopram  10 mg Oral Daily  . digoxin  125 mcg Oral Daily  . docusate sodium  200 mg Oral Daily  . furosemide  80 mg Intravenous BID  . gabapentin  200 mg Oral BID  . insulin aspart  0-20 Units Subcutaneous TID WC  . insulin glargine  20 Units Subcutaneous Daily  . losartan  25 mg Oral Daily  . magnesium oxide  400 mg Oral Daily  . pantoprazole  40 mg  Oral Daily  . senna-docusate  2 tablet Oral Daily  . sildenafil  20 mg Oral TID  . sodium chloride flush  10-40 mL Intracatheter Q12H  . spironolactone  25 mg Oral Daily  . tolvaptan  15 mg Oral Q24H  . Warfarin - Pharmacist Dosing Inpatient   Does not apply q1800    Infusions: . milrinone 0.5 mcg/kg/min (01/25/17 0348)    PRN Medications: acetaminophen, LORazepam, magnesium hydroxide, ondansetron (ZOFRAN) IV, sodium chloride flush, traMADol   Assessment/plan:   1. Acute on chronic end stage biventricular systolic HF: LVEF 24% due to ICM.  - s/p HM3 LVAD 40/1027 - Complicated by RV failure requiring milrinone 0.25 at home, and now up to 0.50 - Clinically much improved. Feels better. CVP down but co-ox remains low. Given fluctuating symptoms at home. I will switch to dobutamine 5 and see what happens - CVP down to 9. Will continue IV lasix one more day   - Sodium 123 today. Will hold tolvaptan and follow  - Recently seen at [facility] regarding transplant candidacy. May have to expedite this.  - May need ramp echo and RHC later this week once optimized  - LDH 187 2. Chronic anticoagulation - On warfarin + ASA - INR goal is 2.0-2.5 -INR 1.66 this am. Will start heparin. Discussed with PharmD  3. CAD s/p CABG - No signs/sx of ischemia. On ASA.  4. PAF  - Remains in NSR off amio.   5. DM  - Continue SSI.   6. Hyponatremia:  - Na 123 - improving with tolvaptan. Will follow   - Continue free fluid restriction.  7. Severe headache  - Resolved CT reviewed personally. No ICH.   Length of Stay: 4  Glori Bickers, MD 01/25/2017, 6:31 AM  VAD Team --- VAD ISSUES ONLY--- Pager [phone] (7am - 7am)  Advanced Heart Failure Team  Pager [phone] (M-F; 7a - 4p)  Please contact [facility] for night-coverage after hours (4p -7a ) and weekends on [email or web address]

## 2017-01-25 NOTE — Plan of Care (Signed)
Problem: Safety: Goal: Ability to remain free from injury will improve Outcome: Progressing Ambulating without injury  Problem: Health Behavior/Discharge Planning: Goal: Ability to manage health-related needs will improve Outcome: Progressing Discussed maintaining lower po fluid intake

## 2017-01-25 NOTE — Progress Notes (Signed)
LVAD Coordinator Rounding Note:  Admitted 01/21/2017 due to nausea, vomiting, and volume overload.   HeartMate 3 LVAD implated on 09/09/2016 by Dr Darcey Nora.   Ambulated personally this AM and assisted to chair.   Vital signs: HR: 82 Doppler Pressure:72 Automatic BP: 94/72 CVP: 13 O2 Sat: 97 on RA Wt: 106.2 kg   LVAD interrogation reveals:  Speed:5900 Flow: 5.6 Power:  5 PI:3.4  Alarms: none Events:  20+ PI events over past 24 hours Fixed speed: 5900 Low speed limit: 5600  Drive Line: Weekly.Done personally today.  Labs:  LDH trend:216>197>180>161>187  INR trend: 2.22>2.20>2.01>1.88>1.66  Anticoagulation Plan: -INR Goal: 2-2.5 (heparin gtt started 4/24) -ASA Dose: 81 mg  Adverse Events on VAD: -Started on home Milrinone 11/30/2016  Plan/Recommendations:   1. Possible RAMP and RHC later this week per MD 2. Nursing- Please ambulate patient as tolerated.  Balinda Quails RN, VAD Coordinator 24/7 pager [phone]

## 2017-01-25 NOTE — Progress Notes (Signed)
ANTICOAGULATION CONSULT NOTE - Initial Consult  Pharmacy Consult for Heparin  Indication: VAD, sub-therapeutic INR  Allergies  Allergen Reactions  . Codeine Nausea And Vomiting  . Trazodone And Nefazodone Other (See Comments)    Dizziness with subsequent fall   . Lipitor [Atorvastatin] Nausea Only    Nausea with high doses, tolerates 20mg  dose (08/22/16)  . Tape Itching   Patient Measurements: Height: 5\' 9"  (175.3 cm) Weight: 234 lb 1.6 oz (106.2 kg) IBW/kg (Calculated) : 70.7  Vital Signs: Temp: 98.1 F (36.7 C) (04/23 2013) Temp Source: Oral (04/24 0418) BP: 94/72 (04/24 0007) Pulse Rate: 92 (04/24 0600)  Labs:  Recent Labs  01/23/17 0455 01/23/17 1457 01/24/17 0538 01/25/17 0509  HGB 8.8*  --  8.7* 8.9*  HCT 28.2*  --  28.1* 28.0*  PLT 202  --  179 183  LABPROT 23.1*  --  21.8* 19.8*  INR 2.01  --  1.88 1.66  CREATININE 1.14 1.11 0.99 0.98    Estimated Creatinine Clearance: 96.3 mL/min (by C-G formula based on SCr of 0.98 mg/dL).   Medical History: Past Medical History:  Diagnosis Date  . AICD (automatic cardioverter/defibrillator) present   . ASCVD (arteriosclerotic cardiovascular disease)   . Chronic systolic CHF (congestive heart failure) ([place])   . COPD (chronic obstructive pulmonary disease) ([place])   . Coronary artery disease   . Depression   . Diabetes mellitus   . History of cocaine abuse   . Hypertension   . Presence of permanent cardiac pacemaker   . Shortness of breath dyspnea   . Suicidal ideation   . Tobacco abuse    Assessment: 61 y/o M s/p LVAD on warfarin. INR below therapeutic range at 1.66 this AM, previously therapeutic on 1800 units/hr of heparin. Hgb low/stable.   Goal of Therapy:  Heparin level 0.3-0.7 units/ml Monitor platelets by anticoagulation protocol: Yes   Plan:  -Start heparin drip at 1500 units/hr -1500 HL  Narda Bonds 01/25/2017,6:57 AM

## 2017-01-25 NOTE — Progress Notes (Signed)
Inpatient Diabetes Program Recommendations  AACE/ADA: New Consensus Statement on Inpatient Glycemic Control (2015)  Target Ranges:  Prepandial:   less than 140 mg/dL      Peak postprandial:   less than 180 mg/dL (1-2 hours)      Critically ill patients:  140 - 180 mg/dL   Lab Results  Component Value Date   GLUCAP 222 (H) 01/25/2017   HGBA1C 8.1 (H) 09/09/2016    Review of Glycemic Control Results for Ronald Miller, Ronald Miller (MRN [id number]) as of 01/25/2017 10:20  Ref. Range 01/24/2017 07:53 01/24/2017 12:38 01/24/2017 17:05 01/24/2017 21:37 01/25/2017 08:54  Glucose-Capillary Latest Ref Range: 65 - 99 mg/dL 167 (H) 207 (H) 149 (H) 263 (H) 222 (H)   Diabetes history: DM Outpatient Diabetes medications: Lantus 20 units qd + Nov 10 units tid MC Current orders for Inpatient glycemic control: Lantus 20 units qd +Novolog correction 0-20 units tid  Inpatient Diabetes Program Recommendations:  Please consider Novolog 3 units meal coverage tid if eats 50% to cover postprandial CBGs.  Thank you, Nani Gasser. Reyan Helle, RN, MSN, CDE  Diabetes Coordinator Inpatient Glycemic Control Team Team Pager [phone] (8am-5pm) 01/25/2017 10:29 AM

## 2017-01-25 NOTE — Progress Notes (Signed)
[facility] for Coumadin / heparin Indication: LVAD  Allergies  Allergen Reactions  . Codeine Nausea And Vomiting  . Trazodone And Nefazodone Other (See Comments)    Dizziness with subsequent fall   . Lipitor [Atorvastatin] Nausea Only    Nausea with high doses, tolerates 20mg  dose (08/22/16)  . Tape Itching    Patient Measurements: Height: 5\' 9"  (175.3 cm) Weight: 234 lb 1.6 oz (106.2 kg) IBW/kg (Calculated) : 70.7  Vital Signs: Temp: 98 F (36.7 C) (04/24 1148) Temp Source: Oral (04/24 1148) BP: 89/75 (04/24 1148) Pulse Rate: 86 (04/24 1148)  Labs:  Recent Labs  01/23/17 0455 01/23/17 1457 01/24/17 0538 01/25/17 0509 01/25/17 1436  HGB 8.8*  --  8.7* 8.9*  --   HCT 28.2*  --  28.1* 28.0*  --   PLT 202  --  179 183  --   LABPROT 23.1*  --  21.8* 19.8*  --   INR 2.01  --  1.88 1.66  --   HEPARINUNFRC  --   --   --   --  0.29*  CREATININE 1.14 1.11 0.99 0.98  --     Estimated Creatinine Clearance: 96.3 mL/min (by C-G formula based on SCr of 0.98 mg/dL).  Assessment: 61yom s/p LVAD on coumadin. INR therapeutic on admission at 2.22. CBC, LDH continues to stable.  INR fell this am 1.66 despite boost dose last pm. Has diuresed well could be clearing warfarin faster.  Will give inc boost tonight. Heparin drip started until INR > 1.8  Heparin drip 1500 uts/hr HL 0.29 slightly less than goal will increase.  CBC ok  Home dose: 15mg  daily except 10mg  on Tuesday  Goal of Therapy:  2-2.5 Monitor platelets by anticoagulation protocol: Yes   Plan:  1) Coumadin 20mg  x1 tonight 2) Daily INR Heparin drip 1600 uts/hr Daily HL, CBC  Bonnita Nasuti Pharm.D. CPP, BCPS Clinical Pharmacist [phone] 01/25/2017 4:41 PM

## 2017-01-26 ENCOUNTER — Inpatient Hospital Stay (HOSPITAL_COMMUNITY): Payer: Medicare HMO

## 2017-01-26 DIAGNOSIS — I82621 Acute embolism and thrombosis of deep veins of right upper extremity: Secondary | ICD-10-CM

## 2017-01-26 DIAGNOSIS — M79609 Pain in unspecified limb: Secondary | ICD-10-CM

## 2017-01-26 LAB — CBC
HCT: 28.1 % — ABNORMAL LOW (ref 39.0–52.0)
Hemoglobin: 8.6 g/dL — ABNORMAL LOW (ref 13.0–17.0)
MCH: 23.1 pg — ABNORMAL LOW (ref 26.0–34.0)
MCHC: 30.6 g/dL (ref 30.0–36.0)
MCV: 75.5 fL — ABNORMAL LOW (ref 78.0–100.0)
Platelets: 183 10*3/uL (ref 150–400)
RBC: 3.72 MIL/uL — ABNORMAL LOW (ref 4.22–5.81)
RDW: 17.3 % — AB (ref 11.5–15.5)
WBC: 6.1 10*3/uL (ref 4.0–10.5)

## 2017-01-26 LAB — COOXEMETRY PANEL
Carboxyhemoglobin: 1.3 % (ref 0.5–1.5)
METHEMOGLOBIN: 1.2 % (ref 0.0–1.5)
O2 Saturation: 53.5 %
Total hemoglobin: 8.8 g/dL — ABNORMAL LOW (ref 12.0–16.0)

## 2017-01-26 LAB — GLUCOSE, CAPILLARY
GLUCOSE-CAPILLARY: 212 mg/dL — AB (ref 65–99)
Glucose-Capillary: 145 mg/dL — ABNORMAL HIGH (ref 65–99)
Glucose-Capillary: 153 mg/dL — ABNORMAL HIGH (ref 65–99)
Glucose-Capillary: 155 mg/dL — ABNORMAL HIGH (ref 65–99)

## 2017-01-26 LAB — BASIC METABOLIC PANEL
Anion gap: 8 (ref 5–15)
BUN: 24 mg/dL — AB (ref 6–20)
CHLORIDE: 81 mmol/L — AB (ref 101–111)
CO2: 33 mmol/L — ABNORMAL HIGH (ref 22–32)
Calcium: 8.3 mg/dL — ABNORMAL LOW (ref 8.9–10.3)
Creatinine, Ser: 0.96 mg/dL (ref 0.61–1.24)
GFR calc Af Amer: >60 mL/min (ref >60)
GFR calc non Af Amer: >60 mL/min (ref >60)
GLUCOSE: 189 mg/dL — AB (ref 65–99)
POTASSIUM: 4 mmol/L (ref 3.5–5.1)
Sodium: 122 mmol/L — ABNORMAL LOW (ref 135–145)

## 2017-01-26 LAB — PROTIME-INR
INR: 1.79
Prothrombin Time: 21.1 seconds — ABNORMAL HIGH (ref 11.4–15.2)

## 2017-01-26 LAB — LACTATE DEHYDROGENASE: LDH: 185 U/L (ref 98–192)

## 2017-01-26 MED ORDER — WARFARIN SODIUM 10 MG PO TABS
20.0000 mg | ORAL_TABLET | Freq: Once | ORAL | Status: AC
Start: 2017-01-26 — End: 2017-01-26
  Administered 2017-01-26: 20 mg via ORAL
  Filled 2017-01-26: qty 2

## 2017-01-26 MED ORDER — HEPARIN (PORCINE) IN NACL 100-0.45 UNIT/ML-% IJ SOLN
1600.0000 [IU]/h | INTRAMUSCULAR | Status: DC
  Administered 2017-01-26 – 2017-01-27 (×2): 1600 [IU]/h via INTRAVENOUS
  Filled 2017-01-26: qty 250

## 2017-01-26 NOTE — Progress Notes (Addendum)
Paged for paraesthesias and cold RUE with pain around PICC site and active bleeding.   Heparin stopped this am.   On my exam 2+ radial pulse and cool finger tips, warm palm.  Mild "pins and needles" in finger tips. Pt markedly tender around PICC site.  IV team to come pull PICC. Will order stat arterial/venous duplex RUE to r/u DVT or arterial clot.   MD aware.   Legrand Como [address]" [place], PA-C 01/26/2017 1:11 PM

## 2017-01-26 NOTE — Progress Notes (Signed)
HeartMate 2 Rounding Note  Subjective:    Transitioned to dobutamine yesterday with persistent low mixed venous sat. Coox improved in afternoon, but this am back down to 53.5% despite dobutamine @ 5 mcg/kg/min.  States he felt great last night. Walked halls multiple times without difficulty. IV pole kept beeping overnight and he states he slept horribly. Denies SOB or fatigue, just "grumpy".  Blood noted around PICC site. States it's been like that since last night.  Also notes small trail of dried blood from his mouth.  Denies orofacial trauma. Stood up from bed and dropped his controller, tugging his driveline (anchor in place)  CVP 10-11 in chair. Consistent with past few days. Although CVP read as 17-18 while lying in bed with HOB slightly elevated.   Na 123. Creatinine and BUN stable. K 4.0  LVAD INTERROGATION:  HeartMate II LVAD:  Flow 5.5 liters/min, speed 5900, power 4.6, PI 3.5. Occasional PI events.   Objective:    Vital Signs:   Temp:  [97.8 F (36.6 C)-98.2 F (36.8 C)] 98 F (36.7 C) (04/25 0400) Pulse Rate:  [77-94] 79 (04/24 1942) Resp:  [15-19] 19 (04/24 1657) BP: (86-95)/(71-75) 95/73 (04/24 1942) SpO2:  [98 %] 98 % (04/24 1942) Weight:  [234 lb (106.1 kg)] 234 lb (106.1 kg) (04/25 0400) Last BM Date: 01/24/17 Mean arterial Pressure 82  Intake/Output:   Intake/Output Summary (Last 24 hours) at 01/26/17 0735 Last data filed at 01/26/17 0600  Gross per 24 hour  Intake          1573.47 ml  Output             4225 ml  Net         -2651.53 ml     Physical Exam: General: Lying in bed. NAD. Ambulated to chair without difficult.  HEENT: Normal except for dried blood down right side of cheek. Atraumatic. Normocephalic.  Neck: Supple. JVP difficult but at least 9-10 cm. Carotids 2+ bilat; no bruits. No thyromegaly or nodule noted. Cor: Mechanical heart sounds with LVAD hum present. Normal HM3 sounds.  Lungs: Clear.  Abdomen: Soft, non-tender, distended, no  HSM. No bruits or masses. +BS  Driveline: C/D/I; Securement device intact and driveline incorporated. No obvious trauma to site after him dropping his controller.  Extremities: No clubbing, cyanosis, or rash. Trace ankle edema. PICC site with old blood and dressing poorly kept. Neuro: Alert & orientedx3, cranial nerves grossly intact. moves all 4 extremities w/o difficulty. Affect flat  Telemetry: Personally reviewed, NSR 80s   Labs: Basic Metabolic Panel:  Recent Labs Lab 01/22/17 0438 01/23/17 0455 01/23/17 1457 01/23/17 1807 01/24/17 0538 01/25/17 0509 01/26/17 0330  NA 118* 118* 117* 118* 122* 123* 122*  K 3.3* 3.8 3.6  --  3.6 4.3 4.0  CL 77* 78* 77*  --  82* 81* 81*  CO2 34* 32 31  --  32 33* 33*  GLUCOSE 151* 214* 217*  --  208* 223* 189*  BUN 24* 24* 22*  --  24* 26* 24*  CREATININE 1.03 1.14 1.11  --  0.99 0.98 0.96  CALCIUM 8.5* 8.7* 8.4*  --  8.5* 8.5* 8.3*  MG 2.1  --   --   --   --   --   --     Liver Function Tests:  Recent Labs Lab 01/21/17 1230  AST 34  ALT 26  ALKPHOS 86  BILITOT 0.9  PROT 7.4  ALBUMIN 3.8   No results for  input(s): LIPASE, AMYLASE in the last 168 hours. No results for input(s): AMMONIA in the last 168 hours.  CBC:  Recent Labs Lab 01/22/17 0438 01/23/17 0455 01/24/17 0538 01/25/17 0509 01/26/17 0330  WBC 6.8 5.8 5.9 5.5 6.1  NEUTROABS 5.2  --   --   --   --   HGB 9.0* 8.8* 8.7* 8.9* 8.6*  HCT 28.6* 28.2* 28.1* 28.0* 28.1*  MCV 73.7* 74.0* 74.7* 75.3* 75.5*  PLT 226 202 179 183 183    INR:  Recent Labs Lab 01/22/17 0438 01/23/17 0455 01/24/17 0538 01/25/17 0509 01/26/17 0330  INR 2.20 2.01 1.88 1.66 1.79    Other results:    Imaging: No results found.   Medications:     Scheduled Medications: . aspirin EC  81 mg Oral Daily  . citalopram  10 mg Oral Daily  . digoxin  125 mcg Oral Daily  . docusate sodium  200 mg Oral Daily  . furosemide  80 mg Intravenous BID  . gabapentin  200 mg Oral BID  .  insulin aspart  0-20 Units Subcutaneous TID WC  . insulin glargine  20 Units Subcutaneous Daily  . losartan  25 mg Oral Daily  . magnesium oxide  400 mg Oral Daily  . pantoprazole  40 mg Oral Daily  . senna-docusate  2 tablet Oral Daily  . sildenafil  20 mg Oral TID  . sodium chloride flush  10-40 mL Intracatheter Q12H  . spironolactone  25 mg Oral Daily  . Warfarin - Pharmacist Dosing Inpatient   Does not apply q1800    Infusions: . DOBUTamine 5 mcg/kg/min (01/25/17 1900)  . heparin 1,600 Units/hr (01/25/17 2300)    PRN Medications: acetaminophen, LORazepam, magnesium hydroxide, ondansetron (ZOFRAN) IV, sodium chloride flush, traMADol   Assessment/plan:   1. 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  - s/p HM3 LVAD 13/2440 - Complicated by RV failure requiring milrinone 0.25 at home, and now switched to dobutamine up to 5 mcg/kg/min.  - Continue dobutamine 5 mcg/kg/min. Coox marginal/low this am at 53.5% - CVP 10-11. Higher when lying in bed. Will continue IV lsaix 80 mg BID today. Creatinine stable.  - Sodium 122 today. Holding tolvaptan and following.  - Recently seen at [facility] regarding transplant candidacy. May have to expedite this.  - LDH 185 this am.  - Will discuss timing of ramp echo vs RHC with MD.  Possibly tomorrow.  2. Chronic anticoagulation - On warfarin + ASA - INR goal is 2.0-2.5 -INR 1.79 this am. Can likely stop heparin this am. Discussed with Pharm D.  3. CAD s/p CABG - No signs/sx of ischemia. of ischemia. On ASA.  4. PAF  - Remains in NSR off amio. No change.   5. DM  - Continue SSI.  No change.  6. Hyponatremia:  - Na 122. Continue free fluid restriction and to follow closely.  7. Severe headache  - Resolved CT reviewed personally. No ICH. Much improved.   Length of Stay: 5  Annamaria Helling 01/26/2017, 7:35 AM  VAD Team --- VAD ISSUES ONLY--- Pager [phone] (7am - 7am)  Advanced Heart Failure  Team  Pager [phone] (M-F; 7a - 4p)  Please contact [facility] for night-coverage after hours (4p -7a ) and weekends on [email or web address]  Patient seen and examined with the above-signed Advanced Practice Provider and/or Housestaff. I personally reviewed laboratory data, imaging studies and relevant notes. I independently examined the patient and  formulated the important aspects of the plan. I have edited the note to reflect any of my changes or salient points. I have personally discussed the plan with the patient and/or family.  He continues to struggle. Has been switched from milrinone to dobutamine. Symptomatically improved but co-ox down to 53% consistent with low output despite VAD and inotropic support. We again discussed the possibility of RHC but he would like to avoid if he can.   PICC line removed this afternoon due to pain and swelling. Arm now feels much better. U/s shows short segment DVT so heparin restarted until INR > 2.0. Discussed with PharmD.   Will need tunneled RIJ PICC in am with IR for home inotropes.  Renal function stable. No recurrent AF or NSVT.   VAD parameters stable.   Glori Bickers, MD  11:34 PM

## 2017-01-26 NOTE — Progress Notes (Addendum)
[place] for Coumadin / heparin Indication: LVAD  Allergies  Allergen Reactions  . Codeine Nausea And Vomiting  . Trazodone And Nefazodone Other (See Comments)    Dizziness with subsequent fall   . Lipitor [Atorvastatin] Nausea Only    Nausea with high doses, tolerates 20mg  dose (08/22/16)  . Tape Itching    Patient Measurements: Height: 5\' 9"  (175.3 cm) Weight: 234 lb (106.1 kg) IBW/kg (Calculated) : 70.7  Vital Signs: Temp: 98.1 F (36.7 C) (04/25 0751) Temp Source: Oral (04/25 0751) BP: 98/78 (04/25 0800) Pulse Rate: 81 (04/25 0838)  Labs:  Recent Labs  01/24/17 0538 01/25/17 0509 01/25/17 1436 01/26/17 0330  HGB 8.7* 8.9*  --  8.6*  HCT 28.1* 28.0*  --  28.1*  PLT 179 183  --  183  LABPROT 21.8* 19.8*  --  21.1*  INR 1.88 1.66  --  1.79  HEPARINUNFRC  --   --  0.29*  --   CREATININE 0.99 0.98  --  0.96    Estimated Creatinine Clearance: 98.3 mL/min (by C-G formula based on SCr of 0.96 mg/dL).  Assessment: 61yom s/p LVAD on coumadin. INR therapeutic on admission at 2.22. CBC, LDH continues to stable.  INR fell 1.6 > 1.8 with 2 boost doses will boost again today.  Has diuresed well could be clearing warfarin faster.   Heparin drip started until INR > 1.8  Heparin drip 1600 uts/hr will stop heparin today with INR 1.8   CBC ok  Home dose: 15mg  daily except 10mg  on Tuesday  Goal of Therapy:  2-2.5 Monitor platelets by anticoagulation protocol: Yes   Plan:  1) Coumadin 20mg  x1 tonight - repeat 2) Daily INR Stop heparin  Bonnita Nasuti Pharm.D. CPP, BCPS Clinical Pharmacist [phone] 01/26/2017 10:03 AM   ___________________________________________________________________  Patient found to have DVT. Pharmacy has been asked to restart heparin. Previously on heparin 1600 units/hr. HgB 8.6 and PLT 189 this morning. No bleeding reported. Will restart heparin at prior rate.  Georga Bora, PharmD Clinical  Pharmacist 01/26/2017 9:53 PM

## 2017-01-26 NOTE — Progress Notes (Signed)
*  PRELIMINARY RESULTS* Vascular Ultrasound RUE arterial duplex: Patent arteries with no areas of stenosis or occlusion.  RUE venous duplex: Short segment of non-occlusive DVT in proximal right brachial vein. No other DVT. No superificial thrombosis.   Paged results to [place], [place], RVT  01/26/2017, 3:27 PM

## 2017-01-26 NOTE — Plan of Care (Signed)
Problem: Health Behavior/Discharge Planning: Goal: Ability to manage health-related needs will improve Outcome: Progressing Continues to request fluids, not counting fluids hisself. Educated on importance of 1500 ml fluid restriction  Problem: Pain Managment: Goal: General experience of comfort will improve Outcome: Progressing Had DVT in upper arm where PICC line was. Has some discomfort.

## 2017-01-27 ENCOUNTER — Encounter (HOSPITAL_COMMUNITY): Payer: Medicare HMO

## 2017-01-27 LAB — GLUCOSE, CAPILLARY
GLUCOSE-CAPILLARY: 136 mg/dL — AB (ref 65–99)
Glucose-Capillary: 134 mg/dL — ABNORMAL HIGH (ref 65–99)
Glucose-Capillary: 165 mg/dL — ABNORMAL HIGH (ref 65–99)
Glucose-Capillary: 163 mg/dL — ABNORMAL HIGH (ref 65–99)

## 2017-01-27 LAB — BASIC METABOLIC PANEL
Anion gap: 9 (ref 5–15)
BUN: 20 mg/dL (ref 6–20)
CALCIUM: 8.6 mg/dL — AB (ref 8.9–10.3)
CO2: 32 mmol/L (ref 22–32)
CREATININE: 0.97 mg/dL (ref 0.61–1.24)
Chloride: 82 mmol/L — ABNORMAL LOW (ref 101–111)
GFR calc non Af Amer: >60 mL/min (ref >60)
Glucose, Bld: 134 mg/dL — ABNORMAL HIGH (ref 65–99)
Potassium: 4 mmol/L (ref 3.5–5.1)
SODIUM: 123 mmol/L — AB (ref 135–145)

## 2017-01-27 LAB — PROTIME-INR
INR: 2.07
PROTHROMBIN TIME: 23.6 s — AB (ref 11.4–15.2)

## 2017-01-27 LAB — HEPARIN LEVEL (UNFRACTIONATED): Heparin Unfractionated: 0.27 IU/mL — ABNORMAL LOW (ref 0.30–0.70)

## 2017-01-27 LAB — LACTATE DEHYDROGENASE: LDH: 177 U/L (ref 98–192)

## 2017-01-27 MED ORDER — WARFARIN SODIUM 10 MG PO TABS
10.0000 mg | ORAL_TABLET | Freq: Once | ORAL | Status: AC
  Administered 2017-01-27: 10 mg via ORAL
  Filled 2017-01-27: qty 1

## 2017-01-27 NOTE — Progress Notes (Signed)
HeartMate 2 Rounding Note  Subjective:   Remains on dobutamine 5 mcg. Yesterday he had RUE numbness and tingling. [place] + for nonocclusive DVT brachial vein. PICC was removed. Heparin was restarted.   R 2nd and 3rd digit with numbness. Says the numbness is getting better. Overall feeling better. Denies SOB. Hungry.    LVAD INTERROGATION:  HeartMate II LVAD:  Flow 5.5 liters/min, speed 5900, power 5, PI 3.2. Occasional PI events.   Objective:    Vital Signs:   Temp:  [97 F (36.1 C)-98 F (36.7 C)] 97.8 F (36.6 C) (04/26 0700) Pulse Rate:  [41-81] 75 (04/26 0331) Resp:  [14-26] 16 (04/26 0700) BP: (93-105)/(65-89) 93/65 (04/26 0800) SpO2:  [98 %-99 %] 99 % (04/25 2100) Weight:  [237 lb 1.6 oz (107.5 kg)] 237 lb 1.6 oz (107.5 kg) (04/26 0331) Last BM Date: 01/26/17 Mean arterial Pressure  70-80s  Intake/Output:   Intake/Output Summary (Last 24 hours) at 01/27/17 1009 Last data filed at 01/27/17 0800  Gross per 24 hour  Intake            899.2 ml  Output             2725 ml  Net          -1825.8 ml    Physical Exam: GENERAL: Well appearing, male who presents to clinic today in no acute distress. In chair HEENT: normal  NECK: Supple, JVP  8-9.  2+ bilaterally, no bruits.  No lymphadenopathy or thyromegaly appreciated.   CARDIAC:  Mechanical heart sounds with LVAD hum present.  LUNGS:  Clear to auscultation bilaterally.  ABDOMEN:  Soft, round, nontender, positive bowel sounds x4.     LVAD exit site: well-healed and incorporated.  Dressing dry and intact.  No erythema or drainage.  Stabilization device present and accurately applied.  Driveline dressing is being changed daily per sterile technique. EXTREMITIES:  Warm and dry, no cyanosis, clubbing, rash or edema  NEUROLOGIC:  Alert and oriented x 4.  Gait steady.  No aphasia.  No dysarthria.  Affect pleasant.      Telemetry: A sensed V paced 70s  Labs: Basic Metabolic Panel:  Recent Labs Lab 01/22/17 0438   01/23/17 1457 01/23/17 1807 01/24/17 0538 01/25/17 0509 01/26/17 0330 01/27/17 0607  NA 118*  < > 117* 118* 122* 123* 122* 123*  K 3.3*  < > 3.6  --  3.6 4.3 4.0 4.0  CL 77*  < > 77*  --  82* 81* 81* 82*  CO2 34*  < > 31  --  32 33* 33* 32  GLUCOSE 151*  < > 217*  --  208* 223* 189* 134*  BUN 24*  < > 22*  --  24* 26* 24* 20  CREATININE 1.03  < > 1.11  --  0.99 0.98 0.96 0.97  CALCIUM 8.5*  < > 8.4*  --  8.5* 8.5* 8.3* 8.6*  MG 2.1  --   --   --   --   --   --   --   < > = values in this interval not displayed.  Liver Function Tests:  Recent Labs Lab 01/21/17 1230  AST 34  ALT 26  ALKPHOS 86  BILITOT 0.9  PROT 7.4  ALBUMIN 3.8   No results for input(s): LIPASE, AMYLASE in the last 168 hours. No results for input(s): AMMONIA in the last 168 hours.  CBC:  Recent Labs Lab 01/22/17 0438 01/23/17 0455 01/24/17 0538 01/25/17  7829 01/26/17 0330  WBC 6.8 5.8 5.9 5.5 6.1  NEUTROABS 5.2  --   --   --   --   HGB 9.0* 8.8* 8.7* 8.9* 8.6*  HCT 28.6* 28.2* 28.1* 28.0* 28.1*  MCV 73.7* 74.0* 74.7* 75.3* 75.5*  PLT 226 202 179 183 183    INR:  Recent Labs Lab 01/23/17 0455 01/24/17 0538 01/25/17 0509 01/26/17 0330 01/27/17 0607  INR 2.01 1.88 1.66 1.79 2.07    Other results:    Imaging: No results found.   Medications:     Scheduled Medications: . aspirin EC  81 mg Oral Daily  . citalopram  10 mg Oral Daily  . digoxin  125 mcg Oral Daily  . docusate sodium  200 mg Oral Daily  . furosemide  80 mg Intravenous BID  . gabapentin  200 mg Oral BID  . insulin aspart  0-20 Units Subcutaneous TID WC  . insulin glargine  20 Units Subcutaneous Daily  . losartan  25 mg Oral Daily  . magnesium oxide  400 mg Oral Daily  . pantoprazole  40 mg Oral Daily  . senna-docusate  2 tablet Oral Daily  . sildenafil  20 mg Oral TID  . sodium chloride flush  10-40 mL Intracatheter Q12H  . spironolactone  25 mg Oral Daily  . Warfarin - Pharmacist Dosing Inpatient   Does  not apply q1800    Infusions: . DOBUTamine 5 mcg/kg/min (01/26/17 2100)  . heparin 1,600 Units/hr (01/27/17 0800)    PRN Medications: acetaminophen, LORazepam, magnesium hydroxide, ondansetron (ZOFRAN) IV, sodium chloride flush, traMADol   Assessment/plan:   1. 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  - s/p HM3 LVAD 21/3086 - Complicated by RV failure requiring milrinone 0.25 at home, and now switched to dobutamine up to 5 mcg/kg/min.  Plan for tunneled PICC today per IR. - Continue dobutamine 5 mcg. Can check CO-OX after PICC placed.  - Volume status ok. Continue current diuretic regimen.  - Recently seen at [facility] regarding transplant candidacy. May have to expedite this.  LDH stable.  2. Chronic anticoagulation - On warfarin + ASA - INR goal is 2.0-2.5 -INR 2.07. Continue IV heparin with new DVT for 24 hour overlap.   3. CAD s/p CABG - No CP. On ASA.   4. PAF  - Maintaining NSR. On coumadin.   5. DM  - Continue SSI.  No change.  6. Hyponatremia:  - Sodium 122. Continue free water restriction.   7. Severe headache  - Resolved CT reviewed personally. No ICH. Much improved.  8. RUE- DVT - US nonocclusive deep vein thrombosis. PICC line was removed. IV heparin restarted for 24 hour overlap. I discussed with pharmacy.   Possible d/c tomorrow. [facility] to resume care. Will need dobutamine. I will place orders.   Length of Stay: [place], NP 01/27/2017, 10:09 AM  VAD Team --- VAD ISSUES ONLY--- Pager [phone] (7am - 7am)  Advanced Heart Failure Team  Pager [phone] (M-F; 7a - 4p)  Please contact [facility] for night-coverage after hours (4p -7a ) and weekends on [email or web address]  Patient seen and examined with Darrick Grinder, NP. We discussed all aspects of the encounter. I agree with the assessment and plan as stated above.   Overall seems improved on dobutamine. PICC out so unable to check CVP or co-ox. Will continue IV lasix one more day.  We have contacted IR and they will place tunneled IJ  PICC in the am. INR now > 2.0. Given new DVT will overlap with IV heparin one more day. Discussed dosing with PharmD.   Serum sodium remains low but asymptomatic.   Possibly home tomorrow if stable on dobutamine. VAD parameters ok.   Glori Bickers, MD  9:13 PM

## 2017-01-27 NOTE — Progress Notes (Signed)
ANTICOAGULATION CONSULT NOTE - Follow Up Consult  Pharmacy Consult for Heparin and Coumadin Indication: LVAD and new DVT  Allergies  Allergen Reactions  . Codeine Nausea And Vomiting  . Trazodone And Nefazodone Other (See Comments)    Dizziness with subsequent fall   . Lipitor [Atorvastatin] Nausea Only    Nausea with high doses, tolerates 20mg  dose (08/22/16)  . Tape Itching    Patient Measurements: Height: 5\' 9"  (175.3 cm) Weight: 237 lb 1.6 oz (107.5 kg) IBW/kg (Calculated) : 70.7 Heparin Dosing Weight: 94kg  Vital Signs: Temp: 97.8 F (36.6 C) (04/26 0700) Temp Source: Oral (04/26 0700) BP: 93/65 (04/26 0800) Pulse Rate: 75 (04/26 0331)  Labs:  Recent Labs  01/25/17 0509 01/25/17 1436 01/26/17 0330 01/27/17 0607  HGB 8.9*  --  8.6*  --   HCT 28.0*  --  28.1*  --   PLT 183  --  183  --   LABPROT 19.8*  --  21.1* 23.6*  INR 1.66  --  1.79 2.07  HEPARINUNFRC  --  0.29*  --  0.27*  CREATININE 0.98  --  0.96 0.97    Estimated Creatinine Clearance: 97.8 mL/min (by C-G formula based on SCr of 0.97 mg/dL).   Medications:  Heparin @ 1600 units/hr  Assessment: 61yom on coumadin for his LVAD. Heparin added 4/24 with subtherapeutic INR and stopped yesterday with INR 1.8. Today's INR at goal 2.07 after several boosted doses. Also yesterday he was found to have a new RUE DVT so heparin was resumed. Heparin level this morning slightly below goal at 0.27, but with therapeutic INR will not make any rate changes.  Plan is to continue overlap with heparin for ~ 24 hours.  LDH, CBC stable. No bleeding.  Home dose: 15mg  daily except 10mg  on Tuesday  Goal of Therapy:  INR 2-2.5 Heparin level 0.3-0.7 units/ml Monitor platelets by anticoagulation protocol: Yes   Plan:  1) Continue heparin at 1600 units/hr 2) Coumadin 10mg  x 1 3) Daily heparin level, INR, CBC, LDH  Deboraha Sprang 01/27/2017,10:49 AM

## 2017-01-28 ENCOUNTER — Other Ambulatory Visit (HOSPITAL_COMMUNITY): Payer: Self-pay | Admitting: Internal Medicine

## 2017-01-28 ENCOUNTER — Encounter (HOSPITAL_COMMUNITY): Payer: Self-pay | Admitting: Interventional Radiology

## 2017-01-28 ENCOUNTER — Inpatient Hospital Stay (HOSPITAL_COMMUNITY): Payer: Medicare HMO

## 2017-01-28 HISTORY — PX: IR FLUORO GUIDE CV LINE RIGHT: IMG2283

## 2017-01-28 HISTORY — PX: IR US GUIDE VASC ACCESS RIGHT: IMG2390

## 2017-01-28 LAB — BASIC METABOLIC PANEL
ANION GAP: 9 (ref 5–15)
BUN: 18 mg/dL (ref 6–20)
CHLORIDE: 79 mmol/L — AB (ref 101–111)
CO2: 32 mmol/L (ref 22–32)
Calcium: 8.8 mg/dL — ABNORMAL LOW (ref 8.9–10.3)
Creatinine, Ser: 1.06 mg/dL (ref 0.61–1.24)
GFR calc Af Amer: >60 mL/min (ref >60)
GFR calc non Af Amer: >60 mL/min (ref >60)
GLUCOSE: 159 mg/dL — AB (ref 65–99)
POTASSIUM: 4.5 mmol/L (ref 3.5–5.1)
Sodium: 120 mmol/L — ABNORMAL LOW (ref 135–145)

## 2017-01-28 LAB — GLUCOSE, CAPILLARY
GLUCOSE-CAPILLARY: 112 mg/dL — AB (ref 65–99)
Glucose-Capillary: 223 mg/dL — ABNORMAL HIGH (ref 65–99)
Glucose-Capillary: 82 mg/dL (ref 65–99)

## 2017-01-28 LAB — COOXEMETRY PANEL
CARBOXYHEMOGLOBIN: 1.5 % (ref 0.5–1.5)
CARBOXYHEMOGLOBIN: 1.4 % (ref 0.5–1.5)
Methemoglobin: 0.7 % (ref 0.0–1.5)
Methemoglobin: 1.1 % (ref 0.0–1.5)
O2 SAT: 46.5 %
O2 SAT: 59.1 %
Total hemoglobin: 9.6 g/dL — ABNORMAL LOW (ref 12.0–16.0)
Total hemoglobin: 8.7 g/dL — ABNORMAL LOW (ref 12.0–16.0)

## 2017-01-28 LAB — CBC
HEMATOCRIT: 29.4 % — AB (ref 39.0–52.0)
Hemoglobin: 9.2 g/dL — ABNORMAL LOW (ref 13.0–17.0)
MCH: 23.4 pg — AB (ref 26.0–34.0)
MCHC: 31.3 g/dL (ref 30.0–36.0)
MCV: 74.8 fL — ABNORMAL LOW (ref 78.0–100.0)
Platelets: 164 10*3/uL (ref 150–400)
RBC: 3.93 MIL/uL — ABNORMAL LOW (ref 4.22–5.81)
RDW: 17.1 % — ABNORMAL HIGH (ref 11.5–15.5)
WBC: 5.5 10*3/uL (ref 4.0–10.5)

## 2017-01-28 LAB — PROTIME-INR
INR: 2.48
PROTHROMBIN TIME: 27.3 s — AB (ref 11.4–15.2)

## 2017-01-28 LAB — LACTATE DEHYDROGENASE: LDH: 186 U/L (ref 98–192)

## 2017-01-28 LAB — HEPARIN LEVEL (UNFRACTIONATED): Heparin Unfractionated: 0.32 IU/mL (ref 0.30–0.70)

## 2017-01-28 MED ORDER — METOLAZONE 2.5 MG PO TABS: 5.0000 mg | ORAL_TABLET | ORAL | 6 refills | Status: DC

## 2017-01-28 MED ORDER — WARFARIN SODIUM 5 MG PO TABS
5.0000 mg | ORAL_TABLET | ORAL | Status: AC | PRN
Start: 2017-01-28 — End: 2017-01-28
  Administered 2017-01-28: 5 mg via ORAL
  Filled 2017-01-28: qty 1

## 2017-01-28 MED ORDER — LIDOCAINE HCL (PF) 1 % IJ SOLN
INTRAMUSCULAR | Status: DC | PRN
  Administered 2017-01-28: 10 mL

## 2017-01-28 MED ORDER — DOBUTAMINE IN D5W 4-5 MG/ML-% IV SOLN: 5.0000 ug/kg/min | INTRAVENOUS | 0 refills | Status: DC

## 2017-01-28 MED ORDER — HEPARIN SOD (PORK) LOCK FLUSH 100 UNIT/ML IV SOLN
INTRAVENOUS | Status: AC
  Filled 2017-01-28: qty 5

## 2017-01-28 MED ORDER — LIDOCAINE HCL 1 % IJ SOLN
INTRAMUSCULAR | Status: AC
  Filled 2017-01-28: qty 20

## 2017-01-28 MED ORDER — TORSEMIDE 20 MG PO TABS
60.0000 mg | ORAL_TABLET | Freq: Two times a day (BID) | ORAL | Status: DC
  Administered 2017-01-28 (×2): 60 mg via ORAL
  Filled 2017-01-28 (×2): qty 3

## 2017-01-28 MED ORDER — WARFARIN SODIUM 5 MG PO TABS: ORAL_TABLET | ORAL | 6 refills | Status: DC

## 2017-01-28 MED ORDER — TORSEMIDE 20 MG PO TABS: 60.0000 mg | ORAL_TABLET | Freq: Two times a day (BID) | ORAL | 6 refills | Status: DC

## 2017-01-28 MED ORDER — HEPARIN SOD (PORK) LOCK FLUSH 100 UNIT/ML IV SOLN
INTRAVENOUS | Status: DC | PRN
  Administered 2017-01-28: 500 [IU]

## 2017-01-28 NOTE — Procedures (Signed)
Interventional Radiology Procedure Note  Procedure: Placement of a right IJ approach double lumen, cuffed tunneled catheter. Tip is positioned at the superior cavoatrial junction and catheter is ready for immediate use. [id number]  Complications: None Recommendations:  - Ok to shower tomorrow - Do not submerge - Routine line care -Ok to use   Signed,  Dulcy Fanny. Earleen Newport, DO

## 2017-01-28 NOTE — Significant Event (Signed)
Review AVS with patient. A copy of it given to patient to take home. Patient verbalize understanding of medications, appointments, and other instructions in the AVS. All personal belongings packed to take home with patient (personal home LVAD equipments, eyeglasses, cell-phone and Notepad and its charges, clothes and shoes that patient is wearing, all will be going with patient at discharge. Awaiting on sister to arrive around 6pm.     Ronald Miller

## 2017-01-28 NOTE — Progress Notes (Signed)
Pt discharged to home. Central line intact with pump infusing medication. D/C'd with all belongings in tow. Pt A&O, ambulatory, d/c'd via wc with sister and another family member.

## 2017-01-28 NOTE — Discharge Summary (Signed)
Advanced Heart Failure Team  Discharge Summary   Patient ID: Ronald Miller. MRN: [id number], DOB/AGE: 03/17/1956 61 y.o. Admit date: 01/21/2017 D/C date:     01/28/2017   Primary Discharge Diagnoses:  1. 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 Chronic anticoagulation 3. CAD s/p CABG 4. PAF 5. DM2 6. Hyponatremia 7. Severe headache 8. RUE DVT 9. Incidental finding: thyroid nodule  Hospital Course  Ronald Miller is a 61 year old male with a past medical history of chronic systolic HF s/p [employer] ICD in 2014, CAD s/p CABG in 2000, HTN, history of cocaine abuse, tobacco abuse, depression, and COPD. He underwent HM 3 placement on 09/09/16. His post operative course was complicated by RV failure, volume overload, L pleural effusion and PAF.   Pt presented to [facility] 01/21/17 with N/V, severe headache and volume overload. Pt admitted for treatment and further evaluation. Labs pertinent for hyponatremia in setting of metolazone use so diuretics held initially. CT head negative for ICH and HA resolved with pain medications. Mixed venous sat marginal but OK on admission.      Am of 01/22/17 Coox depressed at 47% so milrinone increased to 0.375 mcg/kg/min. IV lasix started with elevated CVP. Pt continued to have poor coox despite increase of milrinone to 0.5 and then transition to dobutamine. Diuresed on IV lasix up to 80 mg IV BID.   Pt mal-positioned in chair over weekend and his PICC line became caught and pulled out, requiring PICC replacement.    Hospital course complicated by RUE pain and redness s/p replacement of his PICC line. INR had dropped to < 1.7 range and pt had been placed on IV heparin until INR trended back towards therapeutic (Goal 1.8 or greater prior to stopping heparin). RUE [place] 01/26/17 + for short segment non-occlusive DVT in R brachial vein. PICC line in that arm removed. Started back on heparin for 24 hours while INR continued to rise. Tunneled  PICC line placed in R subclavian 01/28/17 am for on-going chronic dobutamine support.   Overall pt diuresed > 13 L, though weight inconsistent with this. Diuretics adjusted for home with new inotrope regimen.  Pt will go home today on dobutamine 5 mcg/kg/min in stable but tenuous condition with close follow up in the HF clinic as below.   LVAD Interrogation HM II: Flow: 5.5 liters/min Speed: 5900 PI: 3.4 Power: 5. Occasional PI events    Discharge Weight: 233 lbs Discharge Vitals: Blood pressure (!) 82/67, pulse 78, temperature 97.2 F (36.2 C), temperature source Axillary, resp. rate 15, height 5\' 9"  (1.753 m), weight 233 lb 11.2 oz (106 kg), SpO2 100 %.  Labs: Lab Results  Component Value Date   WBC 5.5 01/28/2017   HGB 9.2 (L) 01/28/2017   HCT 29.4 (L) 01/28/2017   MCV 74.8 (L) 01/28/2017   PLT 164 01/28/2017     Recent Labs Lab 01/28/17 0312  NA 120*  K 4.5  CL 79*  CO2 32  BUN 18  CREATININE 1.06  CALCIUM 8.8*  GLUCOSE 159*   Lab Results  Component Value Date   CHOL 153 08/15/2013   HDL 37 (L) 08/15/2013   LDLCALC 89 08/15/2013   TRIG 133 08/15/2013   BNP (last 3 results)  Recent Labs  10/13/16 1207 11/30/16 1158 01/21/17 1230  BNP 321.5* 565.5* 649.9*    ProBNP (last 3 results) No results for input(s): PROBNP in the last 8760 hours.   Diagnostic Studies/Procedures  Ir Fluoro Guide Cv Line Right  Result Date: 01/28/2017 INDICATION: 61 year old male with a history of heart failure. EXAM: PICC LINE PLACEMENT WITH ULTRASOUND AND FLUOROSCOPIC GUIDANCE MEDICATIONS: None ANESTHESIA/SEDATION: None FLUOROSCOPY TIME:  Fluoroscopy Time: 0 minutes 6 seconds (0.7 mGy). COMPLICATIONS: None PROCEDURE: After written informed consent was obtained, patient was placed in the supine position on angiographic table. Patency of the right internal jugular vein was confirmed with ultrasound with image documentation. Patient was prepped and draped in the usual sterile fashion  including the right neck and right superior chest. Using ultrasound guidance, the skin and subcutaneous tissues overlying the right internal jugular vein were generously infiltrated with 1% lidocaine without epinephrine. Using ultrasound guidance, the right internal jugular vein was punctured with a micropuncture needle, and an 018 wire was advanced into the right heart confirming venous access. A small stab incision was made with an 11 blade scalpel. Peel-away sheath was placed over the wire, and then the wire was removed, marking the wire for estimation of internal catheter length. The chest wall was then generously infiltrated with 1% lidocaine for local anesthesia along the tissue tract. Small stab incision was made with 11 blade scalpel, and then the catheter was back tunneled to the puncture site at the right internal jugular vein. Catheter was pulled through the tract, with the catheter amputated at 22 cm. Catheter was advanced through the peel-away sheath, and the peel-away sheath was removed. Final image was stored. The catheter was anchored to the chest wall with retention sutures, and Derma bond was used to seal the right internal jugular vein incision site and at the right chest wall. Patient tolerated the procedure well and remained hemodynamically stable throughout. No complications were encountered and no significant blood loss was encountered. IMPRESSION: Status post right IJ tunneled catheter placement. Catheter ready for use. Signed, Dulcy Fanny. Earleen Newport, DO [facility] Radiology Electronically Signed   By: Corrie Mckusick D.O.   On: 01/28/2017 10:30   Ir US Guide Vasc Access Right  Result Date: 01/28/2017 INDICATION: 60 year old male with a history of heart failure. EXAM: PICC LINE PLACEMENT WITH ULTRASOUND AND FLUOROSCOPIC GUIDANCE MEDICATIONS: None ANESTHESIA/SEDATION: None FLUOROSCOPY TIME:  Fluoroscopy Time: 0 minutes 6 seconds (0.7 mGy).  COMPLICATIONS: None PROCEDURE: After written informed consent was obtained, patient was placed in the supine position on angiographic table. Patency of the right internal jugular vein was confirmed with ultrasound with image documentation. Patient was prepped and draped in the usual sterile fashion including the right neck and right superior chest. Using ultrasound guidance, the skin and subcutaneous tissues overlying the right internal jugular vein were generously infiltrated with 1% lidocaine without epinephrine. Using ultrasound guidance, the right internal jugular vein was punctured with a micropuncture needle, and an 018 wire was advanced into the right heart confirming venous access. A small stab incision was made with an 11 blade scalpel. Peel-away sheath was placed over the wire, and then the wire was removed, marking the wire for estimation of internal catheter length. The chest wall was then generously infiltrated with 1% lidocaine for local anesthesia along the tissue tract. Small stab incision was made with 11 blade scalpel, and then the catheter was back tunneled to the puncture site at the right internal jugular vein. Catheter was pulled through the tract, with the catheter amputated at 22 cm. Catheter was advanced through the peel-away sheath, and the peel-away sheath was removed. Final image was stored. The catheter was anchored to the chest wall  with retention sutures, and Derma bond was used to seal the right internal jugular vein incision site and at the right chest wall. Patient tolerated the procedure well and remained hemodynamically stable throughout. No complications were encountered and no significant blood loss was encountered. IMPRESSION: Status post right IJ tunneled catheter placement. Catheter ready for use. Signed, Dulcy Fanny. Earleen Newport, DO [facility] Radiology Electronically Signed   By: Corrie Mckusick D.O.   On: 01/28/2017 10:30     Discharge Medications   Allergies as of 01/28/2017      Reactions   Codeine Nausea And Vomiting   Trazodone And Nefazodone Other (See Comments)   Dizziness with subsequent fall    Lipitor [atorvastatin] Nausea Only   Nausea with high doses, tolerates 20mg  dose (08/22/16)   Tape Itching      Medication List    STOP taking these medications   milrinone 20 MG/100 ML Soln infusion Commonly known as:  PRIMACOR     TAKE these medications   amiodarone 200 MG tablet Commonly known as:  PACERONE Take 1 tablet (200 mg total) by mouth daily.   aspirin 81 MG EC tablet Take 1 tablet (81 mg total) by mouth daily.   citalopram 10 MG tablet Commonly known as:  CELEXA Take 1 tablet (10 mg total) by mouth daily.   digoxin 0.125 MG tablet Commonly known as:  LANOXIN Take 1 tablet (125 mcg total) by mouth daily.   DOBUTamine 4-5 MG/ML-% infusion Commonly known as:  DOBUTREX Inject 531 mcg/min into the vein continuous.   docusate sodium 100 MG capsule Commonly known as:  COLACE Take 2 capsules (200 mg total) by mouth daily.   gabapentin 100 MG capsule Commonly known as:  NEURONTIN Take 2 capsules (200 mg total) by mouth 2 (two) times daily.   insulin aspart 100 UNIT/ML FlexPen Commonly known as:  NOVOLOG FLEXPEN Inject 10 Units into the skin 3 (three) times daily with meals.   LANTUS SOLOSTAR 100 UNIT/ML Solostar Pen Generic drug:  Insulin Glargine Inject 20 Units into the skin daily at 10 pm.   losartan 25 MG tablet Commonly known as:  COZAAR Take 1 tablet (25 mg total) by mouth daily.   magnesium oxide 400 (241.3 Mg) MG tablet Commonly known as:  MAG-OX Take 1 tablet (400 mg total) by mouth daily.   metolazone 2.5 MG tablet Commonly known as:  ZAROXOLYN Take 2 tablets (5 mg total) by mouth as directed. By Heart Failure Clinic .... Take on Mondays and Fridays AS NEEDED What changed:  additional instructions   pantoprazole 40 MG tablet Commonly known as:   PROTONIX Take 1 tablet (40 mg total) by mouth daily.   potassium chloride SA 20 MEQ tablet Commonly known as:  K-DUR,KLOR-CON Take 20 mEq by mouth 2 (two) times daily. Take additional 20 mEq tablet on metolazone days   senna-docusate 8.6-50 MG tablet Commonly known as:  Senokot-S Take 2 tablets by mouth daily.   sildenafil 20 MG tablet Commonly known as:  REVATIO Take 1 tablet (20 mg total) by mouth 3 (three) times daily.   spironolactone 25 MG tablet Commonly known as:  ALDACTONE Take 1 tablet (25 mg total) by mouth daily.   torsemide 20 MG tablet Commonly known as:  DEMADEX Take 3 tablets (60 mg total) by mouth 2 (two) times daily. What changed:  how much to take  how to take this  when to take this  additional instructions   warfarin 5  MG tablet Commonly known as:  COUMADIN Take 15 mg daily except for Tuesdays, Take 10 mg daily on Tuesdays. What changed:  how much to take  how to take this  when to take this  additional instructions            Durable Medical Equipment        Start     Ordered   01/27/17 1028  Heart failure home health orders  (Heart failure home health orders / Face to face)  Once    Comments:  Heart Failure Follow-up Care:  Verify follow-up appointments per Patient Discharge Instructions. Confirm transportation arranged. Reconcile home medications with discharge medication list. Remove discontinued medications from use. Assist patient/caregiver to manage medications using pill box. Reinforce low sodium food selection Assessments: Vital signs and oxygen saturation at each visit. Assess home environment for safety concerns, caregiver support and availability of low-sodium foods. Consult Education officer, museum, PT/OT, Dietitian, and CNA based on assessments. Perform comprehensive cardiopulmonary assessment. Notify MD for any change in condition or weight gain of 3 pounds in one day or 5 pounds in one week with symptoms. Daily Weights and  Symptom Monitoring: Ensure patient has access to scales. Teach patient/caregiver to weigh daily before breakfast and after voiding using same scale and record.    Teach patient/caregiver to track weight and symptoms and when to notify Provider. Activity: Develop individualized activity plan with patient/caregiver.  Dobutamine 5 mcg/kg/min per [facility] pharmacy x 12 months.  Anticipate d/c 24-48 hours  Question Answer Comment  Heart Failure Follow-up Care Advanced Heart Failure (AHF) Clinic at [phone]   Obtain the following labs Basic Metabolic Panel   Lab frequency Weekly   Fax lab results to AHF Clinic at [phone]   Diet Low Sodium Heart Healthy   Fluid restrictions: 2000 mL Fluid   Initiate Heart Failure Clinic Diuretic Protocol to be used by [place] only ( to be ordered by Heart Failure Team Providers Only) Yes      01/27/17 1028      Disposition   The patient will be discharged in stable but tenuous condition to home. Discharge Instructions    Diet - low sodium heart healthy    Complete by:  As directed    Heart Failure patients record your daily weight using the same scale at the same time of day    Complete by:  As directed    Increase activity slowly    Complete by:  As directed      Follow-up Information    [place] Follow up on 02/03/2017.   Specialty:  Cardiology Why:  at 1000 am for post hospital follow up. Code for parking is 6001. Contact information: [address] [id number] [place]       Glori Bickers, MD Follow up on 01/31/2017.   Specialty:  Cardiology Why:  at 1000 am for labs Contact information: [place] [address] [phone]             Duration of Discharge Encounter: Greater than 35 minutes   Signed, Annamaria Helling  01/28/2017, 7:45 PM  Patient seen and examined  with the above-signed Advanced Practice Provider and/or Housestaff. I personally reviewed laboratory data, imaging studies and relevant notes. I independently examined the patient and formulated the important aspects of the plan. I have edited the note to reflect any of my changes or salient  points. I have personally discussed the plan with the patient and/or family.  He is improved on dobutamine. Will send home today with close f/u in [facility].   Glori Bickers, MD  11:28 PM

## 2017-01-28 NOTE — Care Management Note (Addendum)
Case Management Note  Patient Details  Name: Tywaun Hiltner. MRN: [id number] Date of Birth: 1956-08-07  Subjective/Objective:     Pt admitted with HF - LVAD installed in 2017               Action/Plan:   PTA independent from home active with [facility] for IV milrinone.  LVAD SW closely following    Expected Discharge Date:  01/28/17               Expected Discharge Plan:  [place]  In-House Referral:  Clinical Social Work  Discharge planning Services  CM Consult  Post Acute Care Choice:    Choice offered to:  Patient  DME Arranged:  IV pump/equipment DME Agency:  [place]:  RN [facility] Agency:  [place]  Status of Service:     If discussed at [place] of Stay Meetings, dates discussed:    Additional Comments: 01/28/2017  Pt likely to discharge this afternoon.  [facility] following for IV infusion and HF HH orders  - aware that pt will discharge home on Dobutamine.  Pt states he is in apartment alone but independent and is ready to discharge home.  Pt denied barriers to obtaining prescription medications.  01/25/17 Pt has been switched to dobutamine with improved co-ox Maryclare Labrador, RN 01/28/2017, 1:26 PM

## 2017-01-28 NOTE — Progress Notes (Signed)
Shift note IV site from dobutamine c/o being sore, slightly red had IV team look at it, checking frequently for blood return, has blood return everytime checked, at least q2 h. Patient does not wish to change IV as he is getting a tunneled catheter this AM. Sleeping sporadically, in bed and up in the chair. Comfort provided. NPO after midnight.

## 2017-01-28 NOTE — Significant Event (Signed)
PIV where dobutamine is infusing remains red, hardened at site. Capped PIV off. Patient has been called to IR for PICC placement. Patient prepped, CHG bath given. Will remove PIV when patient returns to room from IR. RN took patient to IR with transport. LVAD equipments with patient.    Jacky Hartung

## 2017-01-28 NOTE — Progress Notes (Signed)
ANTICOAGULATION CONSULT NOTE - Follow Up Consult  Pharmacy Consult for Coumadin Indication: LVAD and new DVT  Allergies  Allergen Reactions  . Codeine Nausea And Vomiting  . Trazodone And Nefazodone Other (See Comments)    Dizziness with subsequent fall   . Lipitor [Atorvastatin] Nausea Only    Nausea with high doses, tolerates 20mg  dose (08/22/16)  . Tape Itching    Patient Measurements: Height: 5\' 9"  (175.3 cm) Weight: 233 lb 11.2 oz (106 kg) IBW/kg (Calculated) : 70.7 Heparin Dosing Weight: 94kg  Vital Signs: Temp: 98.7 F (37.1 C) (04/27 1147) Temp Source: Axillary (04/27 1147) BP: 82/67 (04/27 1326) Pulse Rate: 78 (04/27 0508)  Labs:  Recent Labs  01/26/17 0330 01/27/17 0607 01/28/17 0312  HGB 8.6*  --  9.2*  HCT 28.1*  --  29.4*  PLT 183  --  164  LABPROT 21.1* 23.6* 27.3*  INR 1.79 2.07 2.48  HEPARINUNFRC  --  0.27* 0.32  CREATININE 0.96 0.97 1.06    Estimated Creatinine Clearance: 88.9 mL/min (by C-G formula based on SCr of 1.06 mg/dL).   Medications:  Heparin @ 1600 units/hr  Assessment: 61yom on coumadin for his LVAD. Heparin added 4/24 with subtherapeutic INR and stopped 4/25 with INR 1.8. Also on 4/25 he was found to have a new RUE DVT so heparin was resumed with plan for overlap ~ 24 hours. Heparin level this morning at goal 0.32. INR also at goal 2.48, but suspect will continue to increase since he received several boosted doses. LDH, CBC stable. No bleeding.  Home dose: 15mg  daily except 10mg  on Tuesday  Goal of Therapy:  INR 2-2.5 Heparin level 0.3-0.7 units/ml Monitor platelets by anticoagulation protocol: Yes   Plan:  1) Stop heparin 2) Coumadin 5mg  lower dose today then can resume home regimen with next INR check on Monday - plan discussed with Oda Kilts, PA.   Deboraha Sprang 01/28/2017,2:41 PM

## 2017-01-28 NOTE — Progress Notes (Signed)
HeartMate 2 Rounding Note  Subjective:   + DVT R brachial vein.  Heparin restarted.   Remains on dobutamine 5 mcg.   IV site sore this am with infiltration. Dobutamine on hold currently.  Denies SOB. Feeling "great". Wants to go home. RUE numbness improving.   Creatinine stable. Down 4 more lbs with IV lasix 80 mg BID. Overall out 13.4 L but weights not consistent.   LVAD INTERROGATION:  HeartMate II LVAD:  Flow 5.5 liters/min, speed 5900, power 5, PI 3.4. Occasional PI events.   Objective:    Vital Signs:   Temp:  [96.8 F (36 C)-98.5 F (36.9 C)] 98.5 F (36.9 C) (04/27 0508) Pulse Rate:  [78] 78 (04/27 0508) Resp:  [18-22] 22 (04/27 0508) BP: (93)/(82) 93/82 (04/26 1300) Weight:  [233 lb 11.2 oz (106 kg)] 233 lb 11.2 oz (106 kg) (04/27 0508) Last BM Date: 01/26/17 Mean arterial Pressure  78  Intake/Output:   Intake/Output Summary (Last 24 hours) at 01/28/17 0941 Last data filed at 01/28/17 0745  Gross per 24 hour  Intake             1524 ml  Output             2460 ml  Net             -936 ml    Physical Exam: GENERAL: Well appearing this am. NAD.  HEENT: Normal. NECK: Supple, JVP 7-8 cm. Carotids OK.  CARDIAC:  Mechanical heart sounds with LVAD hum present.  LUNGS:  CTAB, normal effort.  ABDOMEN:  NT, ND, no HSM. No bruits or masses. +BS  LVAD exit site: Well-healed and incorporated. Dressing dry and intact. No erythema or drainage. Stabilization device present and accurately applied. Driveline dressing changed daily per sterile technique. EXTREMITIES:  Warm and dry. No cyanosis, clubbing, rash, or edema.  NEUROLOGIC:  Alert & oriented x 3. Cranial nerves grossly intact. Moves all 4 extremities w/o difficulty. Affect pleasant       Telemetry: Reviewed personally, A sensed V paced 70s.   Labs: Basic Metabolic Panel:  Recent Labs Lab 01/22/17 0438  01/24/17 0538 01/25/17 0509 01/26/17 0330 01/27/17 0607 01/28/17 0312  NA 118*  < > 122* 123* 122* 123*  120*  K 3.3*  < > 3.6 4.3 4.0 4.0 4.5  CL 77*  < > 82* 81* 81* 82* 79*  CO2 34*  < > 32 33* 33* 32 32  GLUCOSE 151*  < > 208* 223* 189* 134* 159*  BUN 24*  < > 24* 26* 24* 20 18  CREATININE 1.03  < > 0.99 0.98 0.96 0.97 1.06  CALCIUM 8.5*  < > 8.5* 8.5* 8.3* 8.6* 8.8*  MG 2.1  --   --   --   --   --   --   < > = values in this interval not displayed.  Liver Function Tests:  Recent Labs Lab 01/21/17 1230  AST 34  ALT 26  ALKPHOS 86  BILITOT 0.9  PROT 7.4  ALBUMIN 3.8   No results for input(s): LIPASE, AMYLASE in the last 168 hours. No results for input(s): AMMONIA in the last 168 hours.  CBC:  Recent Labs Lab 01/22/17 0438 01/23/17 0455 01/24/17 0538 01/25/17 0509 01/26/17 0330 01/28/17 0312  WBC 6.8 5.8 5.9 5.5 6.1 5.5  NEUTROABS 5.2  --   --   --   --   --   HGB 9.0* 8.8* 8.7* 8.9* 8.6* 9.2*  HCT 28.6* 28.2* 28.1* 28.0* 28.1* 29.4*  MCV 73.7* 74.0* 74.7* 75.3* 75.5* 74.8*  PLT 226 202 179 183 183 164    INR:  Recent Labs Lab 01/24/17 0538 01/25/17 0509 01/26/17 0330 01/27/17 0607 01/28/17 0312  INR 1.88 1.66 1.79 2.07 2.48    Other results:    Imaging: No results found.   Medications:     Scheduled Medications: . heparin lock flush      . aspirin EC  81 mg Oral Daily  . citalopram  10 mg Oral Daily  . digoxin  125 mcg Oral Daily  . docusate sodium  200 mg Oral Daily  . furosemide  80 mg Intravenous BID  . gabapentin  200 mg Oral BID  . insulin aspart  0-20 Units Subcutaneous TID WC  . insulin glargine  20 Units Subcutaneous Daily  . lidocaine      . losartan  25 mg Oral Daily  . magnesium oxide  400 mg Oral Daily  . pantoprazole  40 mg Oral Daily  . senna-docusate  2 tablet Oral Daily  . sildenafil  20 mg Oral TID  . sodium chloride flush  10-40 mL Intracatheter Q12H  . spironolactone  25 mg Oral Daily  . Warfarin - Pharmacist Dosing Inpatient   Does not apply q1800    Infusions: . DOBUTamine 5 mcg/kg/min (01/27/17 2118)     PRN Medications: acetaminophen, heparin lock flush, lidocaine (PF), LORazepam, magnesium hydroxide, ondansetron (ZOFRAN) IV, sodium chloride flush, traMADol   Assessment/plan:   1. 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  - s/p HM3 LVAD 84/6962 - Complicated by RV failure requiring milrinone 0.25 at home, and now switched to dobutamine up to 5 mcg/kg/min.  - s/p Tunneled PICC this am.  Nurse states there has been no interruption of dobutamine infusion, though line was repeatedly beeping for occlusion with IV infiltration.  - Continue dobutamine 5 mcg/kg/min.  Repeat Coox around noon. Pt states he feels greatly.   - Volume status improved. Resume po diuretics at torsemide 60 mg BID.   - Recently seen at [facility] regarding transplant candidacy. May have to expedite this.  - LDH stable.  2. Chronic anticoagulation - On warfarin + ASA - INR goal is 2.0-2.5 -INR 2.5 this am. Stop heparin.  Will need close INR follow up next week.  3. CAD s/p CABG - No CP. On ASA.  NO change.  4. PAF  - Maintaining NSR. On coumadin.  No change.  5. DM  - Continue SSI.  No change.  6. Hyponatremia:  - Sodium 120. Free water restrict.  7. Severe headache  - Resolved.  - CT reviewed personally. reviewed personally. No ICH. Stable.  8. RUE- DVT - US nonocclusive deep vein thrombosis.  - Double covered x 24 hours with heparin. INR 2.5 this am.  - Stop heparin.   Overall stable. Now s/p tunneled PICC placement.  Likely home today. Will check coox around noon.   Length of Stay: [address]  Annamaria Helling 01/28/2017, 9:41 AM  VAD Team --- VAD ISSUES ONLY--- Pager [phone] (7am - 7am)  Advanced Heart Failure Team  Pager [phone] (M-F; 7a - 4p)  Please contact [facility] for night-coverage after hours (4p -7a ) and weekends on [email or web address]  Patient seen and examined with the above-signed Advanced Practice Provider and/or Housestaff. I personally reviewed  laboratory data, imaging studies and relevant notes. I independently examined the patient and formulated the  important aspects of the plan. I have edited the note to reflect any of my changes or salient points. I have personally discussed the plan with the patient and/or family.  He is now s/p placement of RIJ tunneled catheter. Co-ox improved on dobutamine. Volume status ok. INR therapeutic. Can go home today on dobutamine. We have discussed with [facility]. Coumadin dosing also discussed with PharmD. Follow closely in [facility]. VAD parameters stable.   Glori Bickers, MD  5:48 PM

## 2017-01-31 ENCOUNTER — Ambulatory Visit (HOSPITAL_COMMUNITY)
Admission: RE | Admit: 2017-01-31 | Discharge: 2017-01-31 | Disposition: A | Discharge status: Home / Self Care | Payer: Medicare HMO | Source: Ambulatory Visit | Attending: Cardiology | Admitting: Cardiology

## 2017-01-31 ENCOUNTER — Other Ambulatory Visit (HOSPITAL_COMMUNITY): Payer: Self-pay | Admitting: *Deleted

## 2017-01-31 ENCOUNTER — Ambulatory Visit (HOSPITAL_COMMUNITY): Payer: Self-pay | Admitting: Pharmacist

## 2017-01-31 DIAGNOSIS — Z95811 Presence of heart assist device: Secondary | ICD-10-CM

## 2017-01-31 DIAGNOSIS — Z79899 Other long term (current) drug therapy: Secondary | ICD-10-CM | POA: Insufficient documentation

## 2017-01-31 DIAGNOSIS — Z5181 Encounter for therapeutic drug level monitoring: Secondary | ICD-10-CM

## 2017-01-31 LAB — PROTIME-INR
INR: 1.86
Prothrombin Time: 21.7 s — ABNORMAL HIGH (ref 11.4–15.2)

## 2017-01-31 MED ORDER — TRAZODONE HCL 50 MG PO TABS: 25.0000 mg | ORAL_TABLET | Freq: Every evening | ORAL | 3 refills | Status: DC | PRN

## 2017-02-03 ENCOUNTER — Ambulatory Visit (HOSPITAL_COMMUNITY)
Admission: RE | Admit: 2017-02-03 | Discharge: 2017-02-03 | Disposition: A | Discharge status: Home / Self Care | Payer: Medicare HMO | Source: Ambulatory Visit | Attending: Internal Medicine | Admitting: Internal Medicine

## 2017-02-03 ENCOUNTER — Encounter (HOSPITAL_COMMUNITY): Payer: Self-pay

## 2017-02-03 ENCOUNTER — Ambulatory Visit (HOSPITAL_COMMUNITY): Payer: Self-pay | Admitting: Pharmacist

## 2017-02-03 VITALS — BP 101/69 | HR 90 | Resp 16 | Ht 69.0 in | Wt 240.8 lb

## 2017-02-03 DIAGNOSIS — Z915 Personal history of self-harm: Secondary | ICD-10-CM | POA: Diagnosis not present

## 2017-02-03 DIAGNOSIS — Z951 Presence of aortocoronary bypass graft: Secondary | ICD-10-CM | POA: Insufficient documentation

## 2017-02-03 DIAGNOSIS — I82621 Acute embolism and thrombosis of deep veins of right upper extremity: Secondary | ICD-10-CM | POA: Diagnosis not present

## 2017-02-03 DIAGNOSIS — J449 Chronic obstructive pulmonary disease, unspecified: Secondary | ICD-10-CM | POA: Insufficient documentation

## 2017-02-03 DIAGNOSIS — Z87898 Personal history of other specified conditions: Secondary | ICD-10-CM

## 2017-02-03 DIAGNOSIS — E119 Type 2 diabetes mellitus without complications: Secondary | ICD-10-CM | POA: Diagnosis not present

## 2017-02-03 DIAGNOSIS — F329 Major depressive disorder, single episode, unspecified: Secondary | ICD-10-CM | POA: Insufficient documentation

## 2017-02-03 DIAGNOSIS — Z5181 Encounter for therapeutic drug level monitoring: Secondary | ICD-10-CM | POA: Diagnosis not present

## 2017-02-03 DIAGNOSIS — Z95810 Presence of automatic (implantable) cardiac defibrillator: Secondary | ICD-10-CM | POA: Diagnosis not present

## 2017-02-03 DIAGNOSIS — D649 Anemia, unspecified: Secondary | ICD-10-CM | POA: Insufficient documentation

## 2017-02-03 DIAGNOSIS — Z794 Long term (current) use of insulin: Secondary | ICD-10-CM | POA: Insufficient documentation

## 2017-02-03 DIAGNOSIS — I509 Heart failure, unspecified: Secondary | ICD-10-CM

## 2017-02-03 DIAGNOSIS — Z95811 Presence of heart assist device: Secondary | ICD-10-CM

## 2017-02-03 DIAGNOSIS — Z7982 Long term (current) use of aspirin: Secondary | ICD-10-CM | POA: Diagnosis not present

## 2017-02-03 DIAGNOSIS — I110 Hypertensive heart disease with heart failure: Secondary | ICD-10-CM | POA: Insufficient documentation

## 2017-02-03 DIAGNOSIS — Z7901 Long term (current) use of anticoagulants: Secondary | ICD-10-CM | POA: Insufficient documentation

## 2017-02-03 DIAGNOSIS — E871 Hypo-osmolality and hyponatremia: Secondary | ICD-10-CM | POA: Diagnosis not present

## 2017-02-03 DIAGNOSIS — I480 Paroxysmal atrial fibrillation: Secondary | ICD-10-CM | POA: Diagnosis not present

## 2017-02-03 DIAGNOSIS — Z79899 Other long term (current) drug therapy: Secondary | ICD-10-CM

## 2017-02-03 DIAGNOSIS — Z87891 Personal history of nicotine dependence: Secondary | ICD-10-CM | POA: Diagnosis not present

## 2017-02-03 DIAGNOSIS — I2510 Atherosclerotic heart disease of native coronary artery without angina pectoris: Secondary | ICD-10-CM

## 2017-02-03 DIAGNOSIS — I50810 Right heart failure, unspecified: Secondary | ICD-10-CM

## 2017-02-03 DIAGNOSIS — I5022 Chronic systolic (congestive) heart failure: Secondary | ICD-10-CM | POA: Diagnosis present

## 2017-02-03 DIAGNOSIS — F1411 Cocaine abuse, in remission: Secondary | ICD-10-CM | POA: Insufficient documentation

## 2017-02-03 DIAGNOSIS — F1911 Other psychoactive substance abuse, in remission: Secondary | ICD-10-CM

## 2017-02-03 LAB — RAPID URINE DRUG SCREEN, HOSP PERFORMED
Amphetamines: NOT DETECTED
Barbiturates: NOT DETECTED
Benzodiazepines: NOT DETECTED
Cocaine: NOT DETECTED
Opiates: NOT DETECTED
Tetrahydrocannabinol: NOT DETECTED

## 2017-02-03 LAB — BASIC METABOLIC PANEL
ANION GAP: 11 (ref 5–15)
BUN: 34 mg/dL — ABNORMAL HIGH (ref 6–20)
CALCIUM: 8.8 mg/dL — AB (ref 8.9–10.3)
CHLORIDE: 90 mmol/L — AB (ref 101–111)
CO2: 25 mmol/L (ref 22–32)
CREATININE: 1.42 mg/dL — AB (ref 0.61–1.24)
GFR calc non Af Amer: 52 mL/min — ABNORMAL LOW (ref >60)
Glucose, Bld: 168 mg/dL — ABNORMAL HIGH (ref 65–99)
Potassium: 5.1 mmol/L (ref 3.5–5.1)
SODIUM: 126 mmol/L — AB (ref 135–145)

## 2017-02-03 LAB — CBC
HCT: 29.3 % — ABNORMAL LOW (ref 39.0–52.0)
Hemoglobin: 9 g/dL — ABNORMAL LOW (ref 13.0–17.0)
MCH: 22.8 pg — AB (ref 26.0–34.0)
MCHC: 30.7 g/dL (ref 30.0–36.0)
MCV: 74.4 fL — AB (ref 78.0–100.0)
PLATELETS: 187 10*3/uL (ref 150–400)
RBC: 3.94 MIL/uL — AB (ref 4.22–5.81)
RDW: 17.6 % — AB (ref 11.5–15.5)
WBC: 6.2 10*3/uL (ref 4.0–10.5)

## 2017-02-03 LAB — LACTATE DEHYDROGENASE: LDH: 241 U/L — ABNORMAL HIGH (ref 98–192)

## 2017-02-03 LAB — PROTIME-INR
INR: 2.57
Prothrombin Time: 28.1 seconds — ABNORMAL HIGH (ref 11.4–15.2)

## 2017-02-03 MED ORDER — CITALOPRAM HYDROBROMIDE 10 MG PO TABS: 10.0000 mg | ORAL_TABLET | Freq: Every day | ORAL | 6 refills | Status: DC

## 2017-02-03 NOTE — Progress Notes (Signed)
Advanced Heart Failure Clinic Note    Referring Provider: Darylene Price, FNP Primary Care: [facility] Primary Cardiologist: Dr Haroldine Laws.  Transplant Center- [facility]  HPI: Ronald Miller. is a 61 y.o. male with history of chronic systolic HF s/p Medtronic ICD 2014, CAD s/p CABG in 2000, HTN, Hx of cocaine abuse, Tobacco abuse, Depression, PAF, and COPD. HM3 placed 09/09/2016.   Admitted 11/2016 with volume overload and low output heart failure. Diuresed with IV lasix and placed on milrinone. Discharged on milrinone.   Admitted 01/21/2017 with volume overload. Mixed venous saturation was low so milrinone was increased to 0.375 mcg. Milrinone was stopped with persistently low mixed venous saturation so dobtuamine was started. He discharged on 0.5 mcg dobutamine. Hospital course complicated by RUE swelling. US showed non occlusive DVT in R brachial vein. PICC removed and later tunneled PICC was placed. Discharge weight 233 pounds.   Today he returns for LVAD/HF follow up. Overall feeling better. Mild dyspnea with exertion. Denies PND/Orthopnea. No CP. Weight at home 228-229 pounds. Taking all medications. He has not required metolazone. No BRBPR. [facility] following for home dobutamine. No fever or chills. Denies tenderness around PICC.   Denies LVAD trauma. Denies driveline trauma, erythema, or drainage. No ICD shocks.   Reports taking Coumadin as prescribed and adherence to anticoagulation based dietary restrictions. Denies bright red blood per rectum or melena, no dark urine or hematuria.    VAD Indication: Destination therapy- eval ongoing at [facility]  VAD interrogation & Equipment Management: Speed: 5900 Flow: 5.5  Power: 4.6 PI: 3.2  Alarms: 6 no external power alarms on 01/30/2017 Events:3-45 PI events.  Fixed speed 5900 Low speed limit: 5600  Past Medical History:  Diagnosis Date  . AICD (automatic cardioverter/defibrillator) present   . ASCVD (arteriosclerotic  cardiovascular disease)   . Chronic systolic CHF (congestive heart failure) ([place])   . COPD (chronic obstructive pulmonary disease) ([place])   . Coronary artery disease   . Depression   . Diabetes mellitus   . History of cocaine abuse   . Hypertension   . Presence of permanent cardiac pacemaker   . Shortness of breath dyspnea   . Suicidal ideation   . Tobacco abuse     Current Outpatient Prescriptions  Medication Sig Dispense Refill  . amiodarone (PACERONE) 200 MG tablet Take 1 tablet (200 mg total) by mouth daily. 90 tablet 6  . aspirin EC 81 MG EC tablet Take 1 tablet (81 mg total) by mouth daily. 30 tablet 6  . citalopram (CELEXA) 10 MG tablet Take 1 tablet (10 mg total) by mouth daily. 90 tablet 6  . digoxin (LANOXIN) 0.125 MG tablet Take 1 tablet (125 mcg total) by mouth daily. 90 tablet 3  . DOBUTamine (DOBUTREX) 4-5 MG/ML-% infusion Inject 531 mcg/min into the vein continuous. 250 mL 0  . docusate sodium (COLACE) 100 MG capsule Take 2 capsules (200 mg total) by mouth daily. 10 capsule 0  . gabapentin (NEURONTIN) 100 MG capsule Take 2 capsules (200 mg total) by mouth 2 (two) times daily. 180 capsule 3  . insulin aspart (NOVOLOG FLEXPEN) 100 UNIT/ML FlexPen Inject 10 Units into the skin 3 (three) times daily with meals. 15 mL 11  . Insulin Glargine (LANTUS SOLOSTAR) 100 UNIT/ML Solostar Pen Inject 20 Units into the skin daily at 10 pm.    . losartan (COZAAR) 25 MG tablet Take 1 tablet (25 mg total) by mouth daily. 30 tablet 6  . magnesium oxide (MAG-OX) 400 (  241.3 Mg) MG tablet Take 1 tablet (400 mg total) by mouth daily. 30 tablet 6  . metolazone (ZAROXOLYN) 2.5 MG tablet Take 2 tablets (5 mg total) by mouth as directed. By Heart Failure Clinic .... Take on Mondays and Fridays AS NEEDED 60 tablet 6  . pantoprazole (PROTONIX) 40 MG tablet Take 1 tablet (40 mg total) by mouth daily. 30 tablet 6  . potassium chloride SA (K-DUR,KLOR-CON) 20 MEQ tablet Take 20 mEq by mouth 2 (two) times  daily. Take additional 20 mEq tablet on metolazone days    . senna-docusate (SENOKOT-S) 8.6-50 MG tablet Take 2 tablets by mouth daily. 60 tablet 6  . sildenafil (REVATIO) 20 MG tablet Take 1 tablet (20 mg total) by mouth 3 (three) times daily. 90 tablet 2  . spironolactone (ALDACTONE) 25 MG tablet Take 1 tablet (25 mg total) by mouth daily. 30 tablet 6  . torsemide (DEMADEX) 20 MG tablet Take 3 tablets (60 mg total) by mouth 2 (two) times daily. 180 tablet 6  . traZODone (DESYREL) 50 MG tablet Take 0.5 tablets (25 mg total) by mouth at bedtime as needed for sleep. 15 tablet 3  . warfarin (COUMADIN) 5 MG tablet Take 15 mg daily except for Tuesdays, Take 10 mg daily on Tuesdays. 90 tablet 6   No current facility-administered medications for this encounter.     Codeine; Trazodone and nefazodone; Lipitor [atorvastatin]; and Tape  REVIEW OF SYSTEMS: All systems negative except as listed in HPI, PMH and Problem list.  Vital Signs:  Doppler Pressure 84                Automatc BP: 101/69 (81)  HR:90 SPO2: 94   %  Weight: 240    Vitals:   02/03/17 1054  BP: 101/69  Pulse: 90  Resp: 16   Vitals:   02/03/17 1054  BP: 101/69  Pulse: 90  Resp: 16    Wt Readings from Last 3 Encounters:  02/03/17 240 lb 12.8 oz (109.2 kg)  01/28/17 233 lb 11.2 oz (106 kg)  01/13/17 246 lb (111.6 kg)    Physical Exam: GENERAL: Well appearing, male who presents to clinic today in no acute distress. Ambulated in the clinic without difficulty.  HEENT: normal  NECK: Supple, JVP 6-7 .  2+ bilaterally, no bruits.  No lymphadenopathy or thyromegaly appreciated.   CARDIAC:  Mechanical heart sounds with LVAD hum present. R upper chest tunneled PICC LUNGS:  Clear to auscultation bilaterally.  ABDOMEN:  Soft, round, nontender, ++ distendedpositive bowel sounds x4.     LVAD exit site: well-healed and incorporated.  Dressing dry and intact.  No erythema or drainage.  Stabilization device present and  accurately applied.  Driveline dressing is being changed daily per sterile technique. EXTREMITIES:  Warm and dry, no cyanosis, clubbing, rash or edema  NEUROLOGIC:  Alert and oriented x 4.  Gait steady.  No aphasia.  No dysarthria.  Affect pleasant.      ASSESSMENT AND PLAN:  1. Chronic end-stage biventricular systolic HF: LVEF 62% due to ICM -> s/p Echo 08/24/16 LVEF 15%, RV mild dilated, moderately reduced. 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  NYHA III but seems to be doing better.  - Complicated by RV failure. Initially on milrinone but stopped last hospitalization due to low mixed venous saturation.  Now on dobutamine 5 mcg. Continue   No bb with RV failure and low output. Continue revatio 20 mg three times a day  Continue digoxin 0.125 mg daily  Stable Maps. Continue losartan 25 mg daily Volume status stable. Continue torsemide 60 mg twice a day + metolazone twice a week as needed.  Continue 25 mg spiro daily.  - Warfarin with goal INR 2.0 - 2.5 + ASA 81 daily. Avoid lovenox in future due to epistaxis.  - Check UDS today.  2. CAD: No CP. 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  - No s/s of ischemia. Continue current regimen. 3. DMII:  - Hgb A1C 8.3 on 08/26/2016. No change. Needs follow up with PCP.  4. Tobacco abuse:  He has stopped smoking.  5. Atrial fibrillation, paroxysmal - Regular pulse. Continue amio 200 mg daily.  6. Left pleural effusion - underwent repeat thoracentesis in Jan. 2018.  Resolved 01/21/17 CXR 7.  Anxiety/Depression - Continue Celexa 10mg  daily  HF SW trying to get him plugged in with counselor.  8. RUE DVT- identified on [place]. Continue anticoagulants. 9. Hyponatremia: Sodium 126. Asymptomatic. Restrict free water. 10. Anemia:  Hgb 9.  No S/S of bleeding.     Follow up in 2 weeks. I have asked him to restart cardiac rehab. I reviewed today lab work. No changes now.   Tiaunna Buford NP-C  1:23 PM

## 2017-02-03 NOTE — Progress Notes (Signed)
Patient instructed to eat greens today for higher side of normal INR per CHF clinical pharm D Ileene Patrick instructions.  Renee Pain, RN

## 2017-02-03 NOTE — Progress Notes (Signed)
Patient requested to see CSW in the clinic as he has had a difficult time making an appointment for counseling. Patient states due to multiple hospitalizations and complications requiring continued follow up in the clinic he has been challenged to make an appointment. CSW provided patient with some local options as he tried one recommended although it was too far and did not accept his insurance. CSW provided numbers for [employer] [facility] [place] and [employer]. Patient verbalizes understanding and will follow up. CSW continues to follow for supportive intervention and any other needs as they arise. Raquel Sarna, [place], [place]

## 2017-02-03 NOTE — Progress Notes (Signed)
Patient presents for hospital d/c  follow up in [facility] today. Reports no problems with VAD equipment or concerns with drive line.  Vital Signs:  Doppler Pressure 84   Automatc BP: 101/69 (81) HR:90   SPO2:94  %  Weight: 240.8 lb w/o eqt Last weight: 239 lb Home weights: 230 lbs   VAD Indication: Destination Therapy- eval ongoing at [facility]  VAD interrogation & Equipment Management: Speed:5900 Flow: 5.5 Power:4.7 w    PI:3.3  Alarms: 6-NO External Power alarms 01/30/17 Events: 3-5 PI events daily  Fixed speed 5900 Low speed limit: 5600  Primary Controller:  Replace back up battery in 33months. Back up controller:   Replace back up battery in 25 months.  Annual Equipment Maintenance on UBC/PM was performed on 09/2016.   I reviewed the LVAD parameters from today and compared the results to the patient's prior recorded data. LVAD interrogation was NEGATIVE for significant power changes, NEGATIVE for clinical alarms and STABLE for PI events/speed drops. No programming changes were made and pump is functioning within specified parameters. Pt is performing daily controller and system monitor self tests along with completing weekly and monthly maintenance for LVAD equipment.  LVAD equipment check completed and is in good working order. Back-up equipment present. Charged back up battery and performed self-test on equipment.   Exit Site Care: Drive line is being maintained weekly  by sister. Drive line exit site well healed and incorporated. The velour is fully implanted at exit site. Dressing dry and intact. No erythema or drainage. Stabilization device present and accurately applied. Pt denies fever or chills. Pt states they have adequate dressing supplies at home. 8 weekly dressing changes given at this visit.  Significant Events on VAD Support:  11/30/16> Milrinone gtt at discharge 01/27/17> dobutamine gtt at dischage  Device:Protect Therapies: off Last check:  01/03/17  BP & Labs:  MAP 84 - Doppler is reflecting MAP  Hgb 9.0 - No S/S of bleeding. Specifically denies melena/BRBPR or nosebleeds.  LDH stable at 241 with established baseline of 180- 250. Denies tea-colored urine. No power elevations noted on interrogation.   Plan: 1.Return to cardiac rehab 2.Kennyth Lose gave referrals for mental health counseling. Patient to make appt. 3. RTC next week for INR  Balinda Quails RN [place] Coordinator   Office: [phone] 24/7 Emergency VAD Pager: [phone]

## 2017-02-03 NOTE — Patient Instructions (Signed)
Return to clinic in two weeks for clinic visit.  Keep up the great work!!! You look great today!

## 2017-02-03 NOTE — Addendum Note (Signed)
Encounter addended by: Effie Berkshire, RN on: 02/03/2017  4:24 PM<BR>    Actions taken: Sign clinical note

## 2017-02-05 DIAGNOSIS — I2510 Atherosclerotic heart disease of native coronary artery without angina pectoris: Secondary | ICD-10-CM | POA: Diagnosis not present

## 2017-02-05 DIAGNOSIS — I110 Hypertensive heart disease with heart failure: Secondary | ICD-10-CM | POA: Diagnosis not present

## 2017-02-05 DIAGNOSIS — I5082 Biventricular heart failure: Secondary | ICD-10-CM | POA: Diagnosis not present

## 2017-02-05 DIAGNOSIS — I5084 End stage heart failure: Secondary | ICD-10-CM | POA: Diagnosis not present

## 2017-02-05 LAB — NICOTINE AND METABS, URINE
Cotinine, Urine: NOT DETECTED ng/mL
NICOTINE, URINE: NOT DETECTED ng/mL

## 2017-02-07 ENCOUNTER — Other Ambulatory Visit (HOSPITAL_COMMUNITY): Payer: Medicare HMO

## 2017-02-07 ENCOUNTER — Ambulatory Visit (HOSPITAL_COMMUNITY): Payer: Self-pay | Admitting: Pharmacist

## 2017-02-07 ENCOUNTER — Other Ambulatory Visit (HOSPITAL_COMMUNITY): Payer: Self-pay | Admitting: *Deleted

## 2017-02-07 ENCOUNTER — Ambulatory Visit (HOSPITAL_COMMUNITY)
Admission: RE | Admit: 2017-02-07 | Discharge: 2017-02-07 | Disposition: A | Discharge status: Home / Self Care | Payer: Medicare HMO | Source: Ambulatory Visit | Attending: Internal Medicine | Admitting: Internal Medicine

## 2017-02-07 VITALS — BP 117/72 | HR 74 | Ht 69.0 in | Wt 242.4 lb

## 2017-02-07 DIAGNOSIS — I110 Hypertensive heart disease with heart failure: Secondary | ICD-10-CM | POA: Insufficient documentation

## 2017-02-07 DIAGNOSIS — Z95810 Presence of automatic (implantable) cardiac defibrillator: Secondary | ICD-10-CM | POA: Diagnosis not present

## 2017-02-07 DIAGNOSIS — E119 Type 2 diabetes mellitus without complications: Secondary | ICD-10-CM | POA: Insufficient documentation

## 2017-02-07 DIAGNOSIS — D649 Anemia, unspecified: Secondary | ICD-10-CM | POA: Diagnosis not present

## 2017-02-07 DIAGNOSIS — Z79899 Other long term (current) drug therapy: Secondary | ICD-10-CM | POA: Diagnosis not present

## 2017-02-07 DIAGNOSIS — I82621 Acute embolism and thrombosis of deep veins of right upper extremity: Secondary | ICD-10-CM | POA: Diagnosis not present

## 2017-02-07 DIAGNOSIS — I2510 Atherosclerotic heart disease of native coronary artery without angina pectoris: Secondary | ICD-10-CM | POA: Diagnosis not present

## 2017-02-07 DIAGNOSIS — Z794 Long term (current) use of insulin: Secondary | ICD-10-CM | POA: Diagnosis not present

## 2017-02-07 DIAGNOSIS — I5022 Chronic systolic (congestive) heart failure: Secondary | ICD-10-CM | POA: Diagnosis not present

## 2017-02-07 DIAGNOSIS — Z95811 Presence of heart assist device: Secondary | ICD-10-CM | POA: Diagnosis not present

## 2017-02-07 DIAGNOSIS — I509 Heart failure, unspecified: Secondary | ICD-10-CM

## 2017-02-07 DIAGNOSIS — Z7982 Long term (current) use of aspirin: Secondary | ICD-10-CM | POA: Diagnosis not present

## 2017-02-07 DIAGNOSIS — F329 Major depressive disorder, single episode, unspecified: Secondary | ICD-10-CM | POA: Insufficient documentation

## 2017-02-07 DIAGNOSIS — I50810 Right heart failure, unspecified: Secondary | ICD-10-CM

## 2017-02-07 DIAGNOSIS — E871 Hypo-osmolality and hyponatremia: Secondary | ICD-10-CM | POA: Diagnosis not present

## 2017-02-07 DIAGNOSIS — Z7901 Long term (current) use of anticoagulants: Secondary | ICD-10-CM | POA: Diagnosis not present

## 2017-02-07 DIAGNOSIS — I480 Paroxysmal atrial fibrillation: Secondary | ICD-10-CM | POA: Insufficient documentation

## 2017-02-07 DIAGNOSIS — I5023 Acute on chronic systolic (congestive) heart failure: Secondary | ICD-10-CM

## 2017-02-07 LAB — COMPREHENSIVE METABOLIC PANEL
ALK PHOS: 90 U/L (ref 38–126)
ALT: 19 U/L (ref 17–63)
AST: 27 U/L (ref 15–41)
Albumin: 3.8 g/dL (ref 3.5–5.0)
Anion gap: 7 (ref 5–15)
BUN: 25 mg/dL — ABNORMAL HIGH (ref 6–20)
CALCIUM: 8.6 mg/dL — AB (ref 8.9–10.3)
CO2: 29 mmol/L (ref 22–32)
CREATININE: 1.4 mg/dL — AB (ref 0.61–1.24)
Chloride: 89 mmol/L — ABNORMAL LOW (ref 101–111)
GFR calc non Af Amer: 53 mL/min — ABNORMAL LOW (ref >60)
GLUCOSE: 88 mg/dL (ref 65–99)
Potassium: 4.8 mmol/L (ref 3.5–5.1)
SODIUM: 125 mmol/L — AB (ref 135–145)
Total Bilirubin: 0.6 mg/dL (ref 0.3–1.2)
Total Protein: 7.3 g/dL (ref 6.5–8.1)

## 2017-02-07 LAB — CBC
HCT: 28.5 % — ABNORMAL LOW (ref 39.0–52.0)
Hemoglobin: 8.9 g/dL — ABNORMAL LOW (ref 13.0–17.0)
MCH: 23.6 pg — AB (ref 26.0–34.0)
MCHC: 31.2 g/dL (ref 30.0–36.0)
MCV: 75.6 fL — AB (ref 78.0–100.0)
PLATELETS: 175 10*3/uL (ref 150–400)
RBC: 3.77 MIL/uL — AB (ref 4.22–5.81)
RDW: 18.1 % — AB (ref 11.5–15.5)
WBC: 6 10*3/uL (ref 4.0–10.5)

## 2017-02-07 LAB — PROTIME-INR
INR: 3.86
Prothrombin Time: 38.9 seconds — ABNORMAL HIGH (ref 11.4–15.2)

## 2017-02-07 LAB — LACTATE DEHYDROGENASE: LDH: 228 U/L — ABNORMAL HIGH (ref 98–192)

## 2017-02-07 NOTE — Progress Notes (Signed)
Advanced Heart Failure Clinic Note    Referring Provider: Darylene Price, FNP Primary Care: [facility] Primary Cardiologist: Dr Haroldine Laws.  Transplant Center- [facility]  HPI: Ronald Miller. is a 61 y.o. male with history of chronic systolic HF s/p Medtronic ICD 2014, CAD s/p CABG in 2000, HTN, Hx of cocaine abuse, Tobacco abuse, Depression, PAF, and COPD. HM3 placed 09/09/2016.   Admitted 11/2016 with volume overload and low output heart failure. Diuresed with IV lasix and placed on milrinone. Discharged on milrinone.   Admitted 01/21/2017 with volume overload. Mixed venous saturation was low so milrinone was increased to 0.375 mcg. Milrinone was stopped with persistently low mixed venous saturation so dobtuamine was started. He discharged on 0.5 mcg dobutamine. Hospital course complicated by RUE swelling. US showed non occlusive DVT in R brachial vein. PICC removed and later tunneled PICC was placed. Discharge weight 233 pounds.   Today he returns to LVAD/HF Clinic for [place] visit. He is here with his sister. 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 Complaining of severe fatigue and dyspnea with any exertion. Denies PND/Orthopnea. No CP. Says he is taking all meds as prescribed. Feels bloated but denies edema.   pounds. Taking all medications. He has not required metolazone. No BRBPR. [facility] following for home dobutamine. No fever or chills. Denies tenderness around PICC. Sister says he is extremely anxious  Recently seen at [facility] by Dr. Hoyt Koch and they are requiring the following for him to qualify for transplant: 1) Complete cessation of tobacco and substance abuse 2) Counseling for depression and substance abuse 3) Get Hgba1c < 8.0 (was 8.4) 4) Keep BMI < 35   Denies LVAD trauma. Denies driveline trauma, erythema, or drainage. No ICD shocks.   Reports taking Coumadin as prescribed and adherence to anticoagulation based dietary restrictions. Denies bright red blood  per rectum or melena, no dark urine or hematuria.     VAD Indication: Destination Therapy- eval ongoing at [facility]  VAD interrogation & Equipment Management: Speed:5900 Flow: 5.6 Power: 4.6 w PI:3.2  Alarms: none Events: 3-5 PI events daily; 16 PI events today  Fixed speed 5900 Low speed limit: 5600  Past Medical History:  Diagnosis Date  . AICD (automatic cardioverter/defibrillator) present   . ASCVD (arteriosclerotic cardiovascular disease)   . Chronic systolic CHF (congestive heart failure) ([place])   . COPD (chronic obstructive pulmonary disease) ([place])   . Coronary artery disease   . Depression   . Diabetes mellitus   . History of cocaine abuse   . Hypertension   . Presence of permanent cardiac pacemaker   . Shortness of breath dyspnea   . Suicidal ideation   . Tobacco abuse     Current Outpatient Prescriptions  Medication Sig Dispense Refill  . amiodarone (PACERONE) 200 MG tablet Take 1 tablet (200 mg total) by mouth daily. 90 tablet 6  . aspirin EC 81 MG EC tablet Take 1 tablet (81 mg total) by mouth daily. 30 tablet 6  . citalopram (CELEXA) 10 MG tablet Take 1 tablet (10 mg total) by mouth daily. 90 tablet 6  . digoxin (LANOXIN) 0.125 MG tablet Take 1 tablet (125 mcg total) by mouth daily. 90 tablet 3  . DOBUTamine (DOBUTREX) 4-5 MG/ML-% infusion Inject 531 mcg/min into the vein continuous. 250 mL 0  . docusate sodium (COLACE) 100 MG capsule Take 2 capsules (200 mg total) by mouth daily. 10 capsule 0  . gabapentin (NEURONTIN) 100 MG capsule Take 2 capsules (200  mg total) by mouth 2 (two) times daily. 180 capsule 3  . insulin aspart (NOVOLOG FLEXPEN) 100 UNIT/ML FlexPen Inject 10 Units into the skin 3 (three) times daily with meals. 15 mL 11  . Insulin Glargine (LANTUS SOLOSTAR) 100 UNIT/ML Solostar Pen Inject 20 Units into the skin daily at 10 pm.    . losartan (COZAAR) 25 MG tablet Take 1 tablet (25 mg total) by mouth daily. 30 tablet 6  . magnesium oxide  (MAG-OX) 400 (241.3 Mg) MG tablet Take 1 tablet (400 mg total) by mouth daily. 30 tablet 6  . pantoprazole (PROTONIX) 40 MG tablet Take 1 tablet (40 mg total) by mouth daily. 30 tablet 6  . potassium chloride SA (K-DUR,KLOR-CON) 20 MEQ tablet Take 20 mEq by mouth 2 (two) times daily. Take additional 20 mEq tablet on metolazone days    . senna-docusate (SENOKOT-S) 8.6-50 MG tablet Take 2 tablets by mouth daily. 60 tablet 6  . sildenafil (REVATIO) 20 MG tablet Take 1 tablet (20 mg total) by mouth 3 (three) times daily. 90 tablet 2  . spironolactone (ALDACTONE) 25 MG tablet Take 1 tablet (25 mg total) by mouth daily. 30 tablet 6  . torsemide (DEMADEX) 20 MG tablet Take 3 tablets (60 mg total) by mouth 2 (two) times daily. (Patient taking differently: Take 20 mg by mouth 2 (two) times daily. 60 mg am and 40 mg pm) 180 tablet 6  . warfarin (COUMADIN) 5 MG tablet Take 15 mg daily except for Tuesdays, Take 10 mg daily on Tuesdays. (Patient taking differently: 15 mg. ) 90 tablet 6  . metolazone (ZAROXOLYN) 2.5 MG tablet Take 2 tablets (5 mg total) by mouth as directed. By Heart Failure Clinic .... Take on Mondays and Fridays AS NEEDED (Patient not taking: Reported on 02/07/2017) 60 tablet 6  . traZODone (DESYREL) 50 MG tablet Take 0.5 tablets (25 mg total) by mouth at bedtime as needed for sleep. (Patient not taking: Reported on 02/07/2017) 15 tablet 3   No current facility-administered medications for this encounter.     Codeine; Trazodone and nefazodone; Lipitor [atorvastatin]; and Tape  REVIEW OF SYSTEMS: All systems negative except as listed in HPI, PMH and Problem list.  Vital Signs:  Doppler Pressure 90    Automatc BP: 117/72 (85) HR:74 - 81 SPO2: 91 - 95 %  Weight: 243.4 lb w/o eqt Last weight: 240.8 lb Home weights: 240 - 242 lbs   Vitals:   02/07/17 1357 02/07/17 1404  BP: (!) 90/0 117/72  Pulse: 74    Vitals:   02/07/17 1357 02/07/17 1404  BP: (!) 90/0 117/72  Pulse: 74      Wt Readings from Last 3 Encounters:  02/07/17 242 lb 6.4 oz (110 kg)  02/03/17 240 lb 12.8 oz (109.2 kg)  01/28/17 233 lb 11.2 oz (106 kg)    Physical Exam: GENERAL: Obese male. Ambulated in the clinic without difficulty.  HEENT: normal  NECK: Supple, JVP 7-8 .  2+ bilaterally, no bruits.  No lymphadenopathy or thyromegaly appreciated.   CARDIAC:  Mechanical heart sounds with LVAD hum present. Normal HM-3 sounds. R upper chest tunneled PICC. Site is clear.  LUNGS:  Clear to auscultation bilaterally. No wheeze ABDOMEN: Obese. Soft, round, nontender, mildly distended Good BS    LVAD exit site: well-healed and incorporated.  Dressing dry and intact.  No erythema or drainage.  Stabilization device present and accurately applied.  Driveline dressing is being changed daily per sterile technique. No signs infection  EXTREMITIES:  Warm and dry, no cyanosis, clubbing. No rash Trace edema at most.   NEUROLOGIC:  Alert and oriented x 4.  Gait steady.  No aphasia.  No dysarthria.  Affect anxious      ASSESSMENT AND PLAN:  1. Chronic end-stage biventricular systolic HF: LVEF 36% due to ICM -> s/p Echo 08/24/16 LVEF 15%, RV mild dilated, moderately reduced. --> S/P HM3 LVAD 14/01/3153.  - Complicated by RV failure. Initially on milrinone but stopped last hospitalization due to low mixed venous saturation.  Now on dobutamine 5 mcg.  - Continues to complain of severe and fluctuating symptoms. Suspect anxiety/depression is major issue here but co-ox has also been very variable. Volume status seems slightly elevated but not too bad.  Continue torsemide 60 mg twice a day + metolazone twice a week as needed.  Continue 25 mg spiro daily.  - Long discussion about options. Will proceed with RHC with ramp later this week.  - No bb with RV failure and low output. Continue revatio 20 mg three times a day. Can increase based on results of RHC. - Continue digoxin 0.125 mg daily  - Stable MAP. Continue losartan 25  mg daily - VAD parameters stable.  - Warfarin with goal INR 2.0 - 2.5 + ASA 81 daily. INR 3.8 today. Discussed with PharmD who will adjust warfarin. Avoid lovenox in future due to epistaxis.  - Discussed transplant guidelines set forth by Dr. Hoyt Koch in detail with him  2. CAD: No CP. 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  - No s/s of ischemia. Continue current regimen. 3. DMII:  - Hgb A1C 8.3 on 08/26/2016. No change. Needs follow up with PCP.  - Stressed need for better control as part of transplant guidelines. May benefit from Jardiance 4. Tobacco abuse:  - He has stopped smoking.  5. Atrial fibrillation, paroxysmal - In NSR. Continue amio 200 mg daily.  6. Left pleural effusion - underwent repeat thoracentesis in Jan. 2018.  Resolved 01/21/17 CXR 7.  Anxiety/Depression - Continue Celexa 10mg  daily  - HF SW trying to get him plugged in with counselor. Again, stressed need to comply as part of transplant w/u.  8. RUE DVT- identified on [place]. Continue anticoagulants. 9. Hyponatremia:  -Sodium 125. Asymptomatic. Restrict free water. 10. Anemia:  Hgb 8.9.  No S/S of bleeding.   Watch closely. May feel better with transfusion but would like to avoid if transplant candidate.   Glori Bickers, MD  11:47 PM

## 2017-02-07 NOTE — Progress Notes (Signed)
Patient presents for hospital d/c  follow up in [facility] today. Reports no problems with VAD equipment or concerns with drive line.  Symptom  Yes  No  Details   Angina        x Activity:   Claudication        x How far:   Syncope        x When: dizziness with orthostatic changes   Stroke        x   Orthopnea        x How many pillows: 1 - 2  PND        x How often:  CPAP     N/A How many hrs:   Pedal edema        x   Abd fullness        x     N&V        x   Diaphoresis        x When:  Bleeding       x   Urine color   yellow  SOB         x  Activity:  With any activity  Palpitations        x When:  ICD shock        x BiV Medtronic; therapy off; EOS 08/2016  Hospitlizaitons        x When/where/why:  ED visit        x When/where/why:  Other MD        x When/who/why:  Activity    Very limited due to SOB  Fluid    2 liters/daily  Diet    No limitations   Vital Signs:  Doppler Pressure 90  Automatc BP: 117/72 (85) HR:74 - 81 SPO2: 91 - 95 %  Weight: 243.4 lb w/o eqt Last weight: 240.8 lb Home weights: 240 - 242 lbs  VAD Indication: Destination Therapy- eval ongoing at [facility]  VAD interrogation & Equipment Management: Speed:5900 Flow: 5.6 Power: 4.6 w    PI:3.2  Alarms: none Events: 3-5 PI events daily; 16 PI events today  Fixed speed 5900 Low speed limit: 5600  Primary Controller:  Replace back up battery in 75months. Back up controller:   Replace back up battery in 25 months.  Annual Equipment Maintenance on UBC/PM was performed on 09/2016.   I reviewed the LVAD parameters from today and compared the results to the patient's prior recorded data. LVAD interrogation was NEGATIVE for significant power changes, NEGATIVE for clinical alarms and STABLE for PI events/speed drops. No programming changes were made and pump is functioning within specified parameters. Pt is performing daily controller and system monitor self tests along with completing weekly and monthly  maintenance for LVAD equipment.  LVAD equipment check completed and is in good working order. Back-up equipment present. Charged back up battery and performed self-test on equipment.   Exit Site Care: Drive line is being maintained weekly  by sister. Drive line exit site well healed and incorporated. The velour is fully implanted at exit site. Dressing dry and intact. No erythema or drainage. Stabilization device present and accurately applied. Pt denies fever or chills. Pt states they have adequate dressing supplies at home.  Significant Events on VAD Support:  11/30/16> Milrinone gtt at discharge 01/27/17> dobutamine at 5 mcg/kg/min gtt at dischage  Device:Protect Therapies: off; end of service 08/2016 Last check: complete interrogation today per Darnelle Maffucci (Medtronic). DDD (lower rate 60); BiV pacing with 96%  AS-VP  BP & Labs: MAP 90 - Doppler is reflecting MAP  Hgb 8.9 - No S/S of bleeding. Specifically denies melena/BRBPR or nosebleeds.  LDH stable at 228 with established baseline of 180- 250. Denies tea-colored urine. No power elevations noted on interrogation.   Plan: 1. Per [facility]'s recommendations:   a. Return to cardiac rehab   b. Continue monthly substance and nicotine monitoring    c. Complete mental health referral Kennyth Lose gave pt # to call; he will make that call)   d. Maintain HgbA1C < 8.0   e. Maintain wt < 236 lbs 2.Schedule RHC for Friday, 02/11/17 with Dr. Graylon Gunning VAD Coordinator  Office: [phone] 24/7 Emergency VAD Pager: [phone]

## 2017-02-08 ENCOUNTER — Telehealth (HOSPITAL_COMMUNITY): Payer: Self-pay | Admitting: *Deleted

## 2017-02-08 ENCOUNTER — Other Ambulatory Visit (HOSPITAL_COMMUNITY): Payer: Self-pay | Admitting: Internal Medicine

## 2017-02-08 ENCOUNTER — Other Ambulatory Visit (HOSPITAL_COMMUNITY): Payer: Self-pay | Admitting: Student

## 2017-02-08 ENCOUNTER — Other Ambulatory Visit (HOSPITAL_COMMUNITY): Payer: Self-pay | Admitting: *Deleted

## 2017-02-08 ENCOUNTER — Other Ambulatory Visit (HOSPITAL_COMMUNITY): Payer: Self-pay | Admitting: Adult Health

## 2017-02-08 DIAGNOSIS — Z95811 Presence of heart assist device: Secondary | ICD-10-CM

## 2017-02-08 MED FILL — SILDENAFIL 20 MG TABLET: 20 | 30 days supply | Qty: 90 | Fill #1

## 2017-02-08 NOTE — Telephone Encounter (Signed)
[place] human[employer] pre cert approval #[id number]

## 2017-02-09 ENCOUNTER — Other Ambulatory Visit (HOSPITAL_COMMUNITY): Payer: Medicare HMO

## 2017-02-09 ENCOUNTER — Telehealth: Payer: Self-pay | Admitting: Licensed Clinical Social Worker

## 2017-02-09 ENCOUNTER — Encounter (HOSPITAL_BASED_OUTPATIENT_CLINIC_OR_DEPARTMENT_OTHER): Payer: Medicare HMO

## 2017-02-09 NOTE — Telephone Encounter (Signed)
Patient called to say he has an appointment tomorrow Thursday for counseling. Patient will report back after visit to share feedback. Patient looking forward to taking advantage of the support and working through some of his struggles. CSW continues to follow through VAD clinic. Raquel Sarna, [place], [place]

## 2017-02-10 ENCOUNTER — Ambulatory Visit (INDEPENDENT_AMBULATORY_CARE_PROVIDER_SITE_OTHER): Payer: Medicare HMO | Admitting: Licensed Clinical Social Worker

## 2017-02-10 DIAGNOSIS — F1421 Cocaine dependence, in remission: Secondary | ICD-10-CM | POA: Diagnosis not present

## 2017-02-11 ENCOUNTER — Encounter (HOSPITAL_COMMUNITY): Payer: Self-pay | Admitting: Internal Medicine

## 2017-02-11 ENCOUNTER — Ambulatory Visit (HOSPITAL_COMMUNITY)
Admission: RE | Disposition: A | Discharge status: Home / Self Care | Payer: Self-pay | Source: Ambulatory Visit | Attending: Internal Medicine

## 2017-02-11 ENCOUNTER — Other Ambulatory Visit (HOSPITAL_COMMUNITY): Payer: Self-pay | Admitting: Pharmacist

## 2017-02-11 ENCOUNTER — Ambulatory Visit (HOSPITAL_COMMUNITY)
Admission: RE | Admit: 2017-02-11 | Discharge: 2017-02-11 | Disposition: A | Discharge status: Home / Self Care | Payer: Medicare HMO | Source: Ambulatory Visit | Attending: Internal Medicine | Admitting: Internal Medicine

## 2017-02-11 DIAGNOSIS — Z7982 Long term (current) use of aspirin: Secondary | ICD-10-CM | POA: Diagnosis not present

## 2017-02-11 DIAGNOSIS — E871 Hypo-osmolality and hyponatremia: Secondary | ICD-10-CM | POA: Diagnosis not present

## 2017-02-11 DIAGNOSIS — I82621 Acute embolism and thrombosis of deep veins of right upper extremity: Secondary | ICD-10-CM | POA: Diagnosis not present

## 2017-02-11 DIAGNOSIS — I5082 Biventricular heart failure: Secondary | ICD-10-CM | POA: Insufficient documentation

## 2017-02-11 DIAGNOSIS — I5084 End stage heart failure: Secondary | ICD-10-CM | POA: Diagnosis not present

## 2017-02-11 DIAGNOSIS — J449 Chronic obstructive pulmonary disease, unspecified: Secondary | ICD-10-CM | POA: Insufficient documentation

## 2017-02-11 DIAGNOSIS — D649 Anemia, unspecified: Secondary | ICD-10-CM | POA: Insufficient documentation

## 2017-02-11 DIAGNOSIS — Z951 Presence of aortocoronary bypass graft: Secondary | ICD-10-CM | POA: Diagnosis not present

## 2017-02-11 DIAGNOSIS — Z794 Long term (current) use of insulin: Secondary | ICD-10-CM | POA: Diagnosis not present

## 2017-02-11 DIAGNOSIS — E119 Type 2 diabetes mellitus without complications: Secondary | ICD-10-CM | POA: Insufficient documentation

## 2017-02-11 DIAGNOSIS — F329 Major depressive disorder, single episode, unspecified: Secondary | ICD-10-CM | POA: Insufficient documentation

## 2017-02-11 DIAGNOSIS — Z95810 Presence of automatic (implantable) cardiac defibrillator: Secondary | ICD-10-CM | POA: Diagnosis not present

## 2017-02-11 DIAGNOSIS — I110 Hypertensive heart disease with heart failure: Secondary | ICD-10-CM | POA: Insufficient documentation

## 2017-02-11 DIAGNOSIS — I5023 Acute on chronic systolic (congestive) heart failure: Secondary | ICD-10-CM | POA: Insufficient documentation

## 2017-02-11 DIAGNOSIS — Z7901 Long term (current) use of anticoagulants: Secondary | ICD-10-CM | POA: Insufficient documentation

## 2017-02-11 DIAGNOSIS — I480 Paroxysmal atrial fibrillation: Secondary | ICD-10-CM | POA: Insufficient documentation

## 2017-02-11 DIAGNOSIS — I2510 Atherosclerotic heart disease of native coronary artery without angina pectoris: Secondary | ICD-10-CM | POA: Diagnosis not present

## 2017-02-11 DIAGNOSIS — Z95811 Presence of heart assist device: Secondary | ICD-10-CM

## 2017-02-11 HISTORY — PX: RIGHT HEART CATH: CATH118263

## 2017-02-11 LAB — POCT I-STAT 3, VENOUS BLOOD GAS (G3P V)
Acid-Base Excess: 7 mmol/L — ABNORMAL HIGH (ref 0.0–2.0)
Acid-Base Excess: 7 mmol/L — ABNORMAL HIGH (ref 0.0–2.0)
Acid-Base Excess: 8 mmol/L — ABNORMAL HIGH (ref 0.0–2.0)
BICARBONATE: 33.3 mmol/L — AB (ref 20.0–28.0)
BICARBONATE: 34.5 mmol/L — AB (ref 20.0–28.0)
Bicarbonate: 32.8 mmol/L — ABNORMAL HIGH (ref 20.0–28.0)
O2 SAT: 65 %
O2 Saturation: 62 %
O2 Saturation: 62 %
PCO2 VEN: 53.5 mmHg (ref 44.0–60.0)
PCO2 VEN: 54.5 mmHg (ref 44.0–60.0)
PH VEN: 7.403 (ref 7.250–7.430)
PH VEN: 7.407 (ref 7.250–7.430)
PO2 VEN: 33 mmHg (ref 32.0–45.0)
PO2 VEN: 35 mmHg (ref 32.0–45.0)
TCO2: 35 mmol/L (ref 0–100)
TCO2: 34 mmol/L (ref 0–100)
TCO2: 36 mmol/L (ref 0–100)
pCO2, Ven: 54.8 mmHg (ref 44.0–60.0)
pH, Ven: 7.387 (ref 7.250–7.430)
pO2, Ven: 33 mmHg (ref 32.0–45.0)

## 2017-02-11 LAB — PROTIME-INR
INR: 3.13
Prothrombin Time: 32.9 seconds — ABNORMAL HIGH (ref 11.4–15.2)

## 2017-02-11 LAB — GLUCOSE, CAPILLARY
GLUCOSE-CAPILLARY: 119 mg/dL — AB (ref 65–99)
GLUCOSE-CAPILLARY: 119 mg/dL — AB (ref 65–99)

## 2017-02-11 SURGERY — RIGHT HEART CATH
Anesthesia: LOCAL

## 2017-02-11 MED ORDER — ASPIRIN 81 MG PO CHEW
CHEWABLE_TABLET | ORAL | Status: AC
  Administered 2017-02-11: 81 mg via ORAL
  Filled 2017-02-11: qty 1

## 2017-02-11 MED ORDER — MIDAZOLAM HCL 2 MG/2ML IJ SOLN
INTRAMUSCULAR | Status: DC | PRN
Start: 2017-02-11 — End: 2017-02-11
  Administered 2017-02-11: 2 mg via INTRAVENOUS

## 2017-02-11 MED ORDER — SODIUM CHLORIDE 0.9 % IV SOLN: 250.0000 mL | INTRAVENOUS | Status: DC | PRN

## 2017-02-11 MED ORDER — SODIUM CHLORIDE 0.9% FLUSH: 3.0000 mL | INTRAVENOUS | Status: DC | PRN

## 2017-02-11 MED ORDER — HEPARIN (PORCINE) IN NACL 2-0.9 UNIT/ML-% IJ SOLN
INTRAMUSCULAR | Status: AC
  Filled 2017-02-11: qty 1000

## 2017-02-11 MED ORDER — SODIUM CHLORIDE 0.9 % IV SOLN
INTRAVENOUS | Status: DC
  Administered 2017-02-11: 06:00:00 via INTRAVENOUS

## 2017-02-11 MED ORDER — HEPARIN (PORCINE) IN NACL 2-0.9 UNIT/ML-% IJ SOLN
INTRAMUSCULAR | Status: AC | PRN
  Administered 2017-02-11: 500 mL

## 2017-02-11 MED ORDER — ONDANSETRON HCL 4 MG/2ML IJ SOLN: 4.0000 mg | Freq: Four times a day (QID) | INTRAMUSCULAR | Status: DC | PRN

## 2017-02-11 MED ORDER — MIDAZOLAM HCL 2 MG/2ML IJ SOLN
INTRAMUSCULAR | Status: AC
  Filled 2017-02-11: qty 2

## 2017-02-11 MED ORDER — SODIUM CHLORIDE 0.9% FLUSH: 3.0000 mL | Freq: Two times a day (BID) | INTRAVENOUS | Status: DC

## 2017-02-11 MED ORDER — LIDOCAINE HCL (PF) 1 % IJ SOLN
INTRAMUSCULAR | Status: AC
  Filled 2017-02-11: qty 30

## 2017-02-11 MED ORDER — FENTANYL CITRATE (PF) 100 MCG/2ML IJ SOLN
INTRAMUSCULAR | Status: DC | PRN
  Administered 2017-02-11: 25 ug via INTRAVENOUS

## 2017-02-11 MED ORDER — SILDENAFIL CITRATE 20 MG PO TABS: 40.0000 mg | ORAL_TABLET | Freq: Three times a day (TID) | ORAL | 2 refills | Status: DC

## 2017-02-11 MED ORDER — LIDOCAINE HCL (PF) 1 % IJ SOLN
INTRAMUSCULAR | Status: DC | PRN
  Administered 2017-02-11: 20 mL

## 2017-02-11 MED ORDER — ASPIRIN 81 MG PO CHEW
81.0000 mg | CHEWABLE_TABLET | ORAL | Status: AC
  Administered 2017-02-11: 81 mg via ORAL

## 2017-02-11 MED ORDER — HEPARIN SOD (PORK) LOCK FLUSH 100 UNIT/ML IV SOLN
500.0000 [IU] | Freq: Once | INTRAVENOUS | Status: AC
  Administered 2017-02-11: 250 [IU] via INTRAVENOUS

## 2017-02-11 MED ORDER — FENTANYL CITRATE (PF) 100 MCG/2ML IJ SOLN
INTRAMUSCULAR | Status: AC
  Filled 2017-02-11: qty 2

## 2017-02-11 MED ORDER — ACETAMINOPHEN 325 MG PO TABS: 650.0000 mg | ORAL_TABLET | ORAL | Status: DC | PRN

## 2017-02-11 SURGICAL SUPPLY — 6 items
CATH SWAN GANZ 7F STRAIGHT (CATHETERS) ×2 IMPLANT
KIT HEART RIGHT NAMIC (KITS) ×2 IMPLANT
PACK CARDIAC CATHETERIZATION (CUSTOM PROCEDURE TRAY) ×2 IMPLANT
SHEATH PINNACLE 7F 10CM (SHEATH) ×2 IMPLANT
TRANSDUCER W/STOPCOCK (MISCELLANEOUS) ×2 IMPLANT
WIRE EMERALD 3MM-J .025X260CM (WIRE) ×2 IMPLANT

## 2017-02-11 NOTE — H&P (View-Only) (Signed)
Advanced Heart Failure Clinic Note    Referring Provider: Darylene Price, FNP Primary Care: [facility] Primary Cardiologist: Dr Haroldine Laws.  Transplant Center- [facility]  HPI: Markeise Mathews. is a 61 y.o. male with history of chronic systolic HF s/p Medtronic ICD 2014, CAD s/p CABG in 2000, HTN, Hx of cocaine abuse, Tobacco abuse, Depression, PAF, and COPD. HM3 placed 09/09/2016.   Admitted 11/2016 with volume overload and low output heart failure. Diuresed with IV lasix and placed on milrinone. Discharged on milrinone.   Admitted 01/21/2017 with volume overload. Mixed venous saturation was low so milrinone was increased to 0.375 mcg. Milrinone was stopped with persistently low mixed venous saturation so dobtuamine was started. He discharged on 0.5 mcg dobutamine. Hospital course complicated by RUE swelling. US showed non occlusive DVT in R brachial vein. PICC removed and later tunneled PICC was placed. Discharge weight 233 pounds.   Today he returns to LVAD/HF Clinic for [place] visit. He is here with his sister. 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 Complaining of severe fatigue and dyspnea with any exertion. Denies PND/Orthopnea. No CP. Says he is taking all meds as prescribed. Feels bloated but denies edema.   pounds. Taking all medications. He has not required metolazone. No BRBPR. [facility] following for home dobutamine. No fever or chills. Denies tenderness around PICC. Sister says he is extremely anxious  Recently seen at [facility] by Dr. Hoyt Koch and they are requiring the following for him to qualify for transplant: 1) Complete cessation of tobacco and substance abuse 2) Counseling for depression and substance abuse 3) Get Hgba1c < 8.0 (was 8.4) 4) Keep BMI < 35   Denies LVAD trauma. Denies driveline trauma, erythema, or drainage. No ICD shocks.   Reports taking Coumadin as prescribed and adherence to anticoagulation based dietary restrictions. Denies bright red blood  per rectum or melena, no dark urine or hematuria.     VAD Indication: Destination Therapy- eval ongoing at [facility]  VAD interrogation & Equipment Management: Speed:5900 Flow: 5.6 Power: 4.6 w PI:3.2  Alarms: none Events: 3-5 PI events daily; 16 PI events today  Fixed speed 5900 Low speed limit: 5600  Past Medical History:  Diagnosis Date  . AICD (automatic cardioverter/defibrillator) present   . ASCVD (arteriosclerotic cardiovascular disease)   . Chronic systolic CHF (congestive heart failure) ([place])   . COPD (chronic obstructive pulmonary disease) ([place])   . Coronary artery disease   . Depression   . Diabetes mellitus   . History of cocaine abuse   . Hypertension   . Presence of permanent cardiac pacemaker   . Shortness of breath dyspnea   . Suicidal ideation   . Tobacco abuse     Current Outpatient Prescriptions  Medication Sig Dispense Refill  . amiodarone (PACERONE) 200 MG tablet Take 1 tablet (200 mg total) by mouth daily. 90 tablet 6  . aspirin EC 81 MG EC tablet Take 1 tablet (81 mg total) by mouth daily. 30 tablet 6  . citalopram (CELEXA) 10 MG tablet Take 1 tablet (10 mg total) by mouth daily. 90 tablet 6  . digoxin (LANOXIN) 0.125 MG tablet Take 1 tablet (125 mcg total) by mouth daily. 90 tablet 3  . DOBUTamine (DOBUTREX) 4-5 MG/ML-% infusion Inject 531 mcg/min into the vein continuous. 250 mL 0  . docusate sodium (COLACE) 100 MG capsule Take 2 capsules (200 mg total) by mouth daily. 10 capsule 0  . gabapentin (NEURONTIN) 100 MG capsule Take 2 capsules (200  mg total) by mouth 2 (two) times daily. 180 capsule 3  . insulin aspart (NOVOLOG FLEXPEN) 100 UNIT/ML FlexPen Inject 10 Units into the skin 3 (three) times daily with meals. 15 mL 11  . Insulin Glargine (LANTUS SOLOSTAR) 100 UNIT/ML Solostar Pen Inject 20 Units into the skin daily at 10 pm.    . losartan (COZAAR) 25 MG tablet Take 1 tablet (25 mg total) by mouth daily. 30 tablet 6  . magnesium oxide  (MAG-OX) 400 (241.3 Mg) MG tablet Take 1 tablet (400 mg total) by mouth daily. 30 tablet 6  . pantoprazole (PROTONIX) 40 MG tablet Take 1 tablet (40 mg total) by mouth daily. 30 tablet 6  . potassium chloride SA (K-DUR,KLOR-CON) 20 MEQ tablet Take 20 mEq by mouth 2 (two) times daily. Take additional 20 mEq tablet on metolazone days    . senna-docusate (SENOKOT-S) 8.6-50 MG tablet Take 2 tablets by mouth daily. 60 tablet 6  . sildenafil (REVATIO) 20 MG tablet Take 1 tablet (20 mg total) by mouth 3 (three) times daily. 90 tablet 2  . spironolactone (ALDACTONE) 25 MG tablet Take 1 tablet (25 mg total) by mouth daily. 30 tablet 6  . torsemide (DEMADEX) 20 MG tablet Take 3 tablets (60 mg total) by mouth 2 (two) times daily. (Patient taking differently: Take 20 mg by mouth 2 (two) times daily. 60 mg am and 40 mg pm) 180 tablet 6  . warfarin (COUMADIN) 5 MG tablet Take 15 mg daily except for Tuesdays, Take 10 mg daily on Tuesdays. (Patient taking differently: 15 mg. ) 90 tablet 6  . metolazone (ZAROXOLYN) 2.5 MG tablet Take 2 tablets (5 mg total) by mouth as directed. By Heart Failure Clinic .... Take on Mondays and Fridays AS NEEDED (Patient not taking: Reported on 02/07/2017) 60 tablet 6  . traZODone (DESYREL) 50 MG tablet Take 0.5 tablets (25 mg total) by mouth at bedtime as needed for sleep. (Patient not taking: Reported on 02/07/2017) 15 tablet 3   No current facility-administered medications for this encounter.     Codeine; Trazodone and nefazodone; Lipitor [atorvastatin]; and Tape  REVIEW OF SYSTEMS: All systems negative except as listed in HPI, PMH and Problem list.  Vital Signs:  Doppler Pressure 90    Automatc BP: 117/72 (85) HR:74 - 81 SPO2: 91 - 95 %  Weight: 243.4 lb w/o eqt Last weight: 240.8 lb Home weights: 240 - 242 lbs   Vitals:   02/07/17 1357 02/07/17 1404  BP: (!) 90/0 117/72  Pulse: 74    Vitals:   02/07/17 1357 02/07/17 1404  BP: (!) 90/0 117/72  Pulse: 74      Wt Readings from Last 3 Encounters:  02/07/17 242 lb 6.4 oz (110 kg)  02/03/17 240 lb 12.8 oz (109.2 kg)  01/28/17 233 lb 11.2 oz (106 kg)    Physical Exam: GENERAL: Obese male. Ambulated in the clinic without difficulty.  HEENT: normal  NECK: Supple, JVP 7-8 .  2+ bilaterally, no bruits.  No lymphadenopathy or thyromegaly appreciated.   CARDIAC:  Mechanical heart sounds with LVAD hum present. Normal HM-3 sounds. R upper chest tunneled PICC. Site is clear.  LUNGS:  Clear to auscultation bilaterally. No wheeze ABDOMEN: Obese. Soft, round, nontender, mildly distended Good BS    LVAD exit site: well-healed and incorporated.  Dressing dry and intact.  No erythema or drainage.  Stabilization device present and accurately applied.  Driveline dressing is being changed daily per sterile technique. No signs infection  EXTREMITIES:  Warm and dry, no cyanosis, clubbing. No rash Trace edema at most.   NEUROLOGIC:  Alert and oriented x 4.  Gait steady.  No aphasia.  No dysarthria.  Affect anxious      ASSESSMENT AND PLAN:  1. Chronic end-stage biventricular systolic HF: LVEF 86% due to ICM -> s/p Echo 08/24/16 LVEF 15%, RV mild dilated, moderately reduced. --> S/P HM3 LVAD 16/05/3728.  - Complicated by RV failure. Initially on milrinone but stopped last hospitalization due to low mixed venous saturation.  Now on dobutamine 5 mcg.  - Continues to complain of severe and fluctuating symptoms. Suspect anxiety/depression is major issue here but co-ox has also been very variable. Volume status seems slightly elevated but not too bad.  Continue torsemide 60 mg twice a day + metolazone twice a week as needed.  Continue 25 mg spiro daily.  - Long discussion about options. Will proceed with RHC with ramp later this week.  - No bb with RV failure and low output. Continue revatio 20 mg three times a day. Can increase based on results of RHC. - Continue digoxin 0.125 mg daily  - Stable MAP. Continue losartan 25  mg daily - VAD parameters stable.  - Warfarin with goal INR 2.0 - 2.5 + ASA 81 daily. INR 3.8 today. Discussed with PharmD who will adjust warfarin. Avoid lovenox in future due to epistaxis.  - Discussed transplant guidelines set forth by Dr. Hoyt Koch in detail with him  2. CAD: No CP. 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  - No s/s of ischemia. Continue current regimen. 3. DMII:  - Hgb A1C 8.3 on 08/26/2016. No change. Needs follow up with PCP.  - Stressed need for better control as part of transplant guidelines. May benefit from Jardiance 4. Tobacco abuse:  - He has stopped smoking.  5. Atrial fibrillation, paroxysmal - In NSR. Continue amio 200 mg daily.  6. Left pleural effusion - underwent repeat thoracentesis in Jan. 2018.  Resolved 01/21/17 CXR 7.  Anxiety/Depression - Continue Celexa 10mg  daily  - HF SW trying to get him plugged in with counselor. Again, stressed need to comply as part of transplant w/u.  8. RUE DVT- identified on [place]. Continue anticoagulants. 9. Hyponatremia:  -Sodium 125. Asymptomatic. Restrict free water. 10. Anemia:  Hgb 8.9.  No S/S of bleeding.   Watch closely. May feel better with transfusion but would like to avoid if transplant candidate.   Glori Bickers, MD  11:47 PM

## 2017-02-11 NOTE — Discharge Instructions (Signed)

## 2017-02-11 NOTE — Progress Notes (Addendum)
Site area: RFV Site Prior to Removal:  Level 0 Pressure Applied For:30 min Manual:   yes Patient Status During Pull:  stable Post Pull Site:  Level Post Pull Instructions Given:yes   Post Pull Pulses Present: palpable Dressing Applied:  tegaderm Bedrest begins @ 0930 till 1330 Comments:

## 2017-02-11 NOTE — Progress Notes (Signed)
VAD Coordinator Procedure Note:   Patient underwent Right Heart cath. Hemodynamics and VAD parameters monitored by me throughout the procedure. Blood pressure was monitored with automatic cuff as the doppler was reflecting the MAP. All of his primary and back up equipment was present during the procedure as well as myself to assist with management.   Pre-procedure:  0745 Flow 5.5/speed 5950/PI 3.3/Power 4.7/ MAP 118/89 (102)  0800 5.02/5899/3.1/4.6/ 115/87 (97)  0810 5.02/5899/3.1/4.7/ 101/59 (75)  0815 5.02/5899/3.1/4.7/101/59 (75)  0825 3.10/5698/3.2/4.3/ 99/76 (83)  Sedation Induction:Versed / Fentanyl per Cath Lab team    Interventions: speed temporarily increased to 5900 with no change to hemodynamics. See MD note. Will increase Sildenafil to 40 mg TID. [facility] made aware of need to repair Rolator walker and milrinone black bag. Plans made to fix while patient at hospital.    Patient tolerated the procedure well. VAD parameters and hemodynamics were stable throughout the case.   Patient Disposition: to cath lab holding for sheath pull. Mortimer Fries (brother in law) made aware of procedure findings per MD.  Balinda Quails, RN [place] Coordinator   Office: [phone] 24/7 VAD Pager: [phone]

## 2017-02-11 NOTE — Interval H&P Note (Signed)
History and Physical Interval Note:  02/11/2017 7:58 AM  Ronald Miller.  has presented today for surgery, with the diagnosis of hf  The various methods of treatment have been discussed with the patient and family. After consideration of risks, benefits and other options for treatment, the patient has consented to  Procedure(s): Right Heart Cath (N/A) as a surgical intervention .  The patient's history has been reviewed, patient examined, no change in status, stable for surgery.  I have reviewed the patient's chart and labs.  Questions were answered to the patient's satisfaction.     Bensimhon, Quillian Quince

## 2017-02-14 ENCOUNTER — Other Ambulatory Visit (HOSPITAL_COMMUNITY): Payer: Self-pay | Admitting: *Deleted

## 2017-02-14 ENCOUNTER — Ambulatory Visit (HOSPITAL_COMMUNITY)
Admission: RE | Admit: 2017-02-14 | Discharge: 2017-02-14 | Disposition: A | Discharge status: Home / Self Care | Payer: Medicare HMO | Source: Ambulatory Visit | Attending: Cardiology | Admitting: Cardiology

## 2017-02-14 ENCOUNTER — Ambulatory Visit (HOSPITAL_COMMUNITY): Payer: Medicare HMO | Admitting: Psychology

## 2017-02-14 DIAGNOSIS — Z4801 Encounter for change or removal of surgical wound dressing: Secondary | ICD-10-CM | POA: Diagnosis not present

## 2017-02-14 NOTE — Progress Notes (Signed)
Patient presents to clinic today for drive line exit wound care. Existing VAD dressing removed and site care performed using sterile technique. Drive line exit site cleaned with Chlora prep applicators x 2, allowed to dry, and Sorbaview dressing with bio patch re-applied. Exit site healed and incorporated, the velour is fully implanted at exit site. No redness, tenderness, drainage, foul odor or rash noted. Drive line anchor re-applied. Pt denies fever or chills.   Return Wednesday for previously scheduled appointment.   Balinda Quails RN, VAD Coordinator 24/7 pager [phone]

## 2017-02-15 ENCOUNTER — Telehealth: Payer: Self-pay | Admitting: *Deleted

## 2017-02-15 MED FILL — Heparin Sodium (Porcine) 2 Unit/ML in Sodium Chloride 0.9%: INTRAMUSCULAR | Qty: 500 | Status: AC

## 2017-02-15 NOTE — Telephone Encounter (Signed)
Opened in Error.

## 2017-02-16 ENCOUNTER — Other Ambulatory Visit: Payer: Self-pay | Admitting: Unknown Physician Specialty

## 2017-02-16 ENCOUNTER — Ambulatory Visit (HOSPITAL_COMMUNITY)
Admission: RE | Admit: 2017-02-16 | Discharge: 2017-02-16 | Disposition: A | Discharge status: Home / Self Care | Payer: Medicare HMO | Source: Ambulatory Visit | Attending: Internal Medicine | Admitting: Internal Medicine

## 2017-02-16 ENCOUNTER — Other Ambulatory Visit (HOSPITAL_COMMUNITY): Payer: Medicare HMO | Admitting: Medical

## 2017-02-16 ENCOUNTER — Ambulatory Visit (HOSPITAL_COMMUNITY): Payer: Self-pay | Admitting: Pharmacist

## 2017-02-16 ENCOUNTER — Encounter (HOSPITAL_COMMUNITY): Payer: Self-pay | Admitting: Psychology

## 2017-02-16 ENCOUNTER — Telehealth (HOSPITAL_COMMUNITY): Payer: Self-pay | Admitting: Psychology

## 2017-02-16 ENCOUNTER — Encounter (HOSPITAL_COMMUNITY): Payer: Self-pay | Admitting: Licensed Clinical Social Worker

## 2017-02-16 VITALS — BP 98/0 | HR 87 | Ht 68.0 in | Wt 236.8 lb

## 2017-02-16 DIAGNOSIS — E871 Hypo-osmolality and hyponatremia: Secondary | ICD-10-CM | POA: Insufficient documentation

## 2017-02-16 DIAGNOSIS — Z7982 Long term (current) use of aspirin: Secondary | ICD-10-CM | POA: Diagnosis not present

## 2017-02-16 DIAGNOSIS — J449 Chronic obstructive pulmonary disease, unspecified: Secondary | ICD-10-CM | POA: Insufficient documentation

## 2017-02-16 DIAGNOSIS — E119 Type 2 diabetes mellitus without complications: Secondary | ICD-10-CM | POA: Insufficient documentation

## 2017-02-16 DIAGNOSIS — Z950 Presence of cardiac pacemaker: Secondary | ICD-10-CM | POA: Insufficient documentation

## 2017-02-16 DIAGNOSIS — I480 Paroxysmal atrial fibrillation: Secondary | ICD-10-CM

## 2017-02-16 DIAGNOSIS — Z7901 Long term (current) use of anticoagulants: Secondary | ICD-10-CM | POA: Insufficient documentation

## 2017-02-16 DIAGNOSIS — Z79891 Long term (current) use of opiate analgesic: Secondary | ICD-10-CM | POA: Diagnosis not present

## 2017-02-16 DIAGNOSIS — Z794 Long term (current) use of insulin: Secondary | ICD-10-CM | POA: Diagnosis not present

## 2017-02-16 DIAGNOSIS — F329 Major depressive disorder, single episode, unspecified: Secondary | ICD-10-CM | POA: Diagnosis not present

## 2017-02-16 DIAGNOSIS — D649 Anemia, unspecified: Secondary | ICD-10-CM | POA: Insufficient documentation

## 2017-02-16 DIAGNOSIS — I82621 Acute embolism and thrombosis of deep veins of right upper extremity: Secondary | ICD-10-CM | POA: Diagnosis not present

## 2017-02-16 DIAGNOSIS — Z951 Presence of aortocoronary bypass graft: Secondary | ICD-10-CM | POA: Insufficient documentation

## 2017-02-16 DIAGNOSIS — Z95811 Presence of heart assist device: Secondary | ICD-10-CM

## 2017-02-16 DIAGNOSIS — I2510 Atherosclerotic heart disease of native coronary artery without angina pectoris: Secondary | ICD-10-CM | POA: Insufficient documentation

## 2017-02-16 DIAGNOSIS — I110 Hypertensive heart disease with heart failure: Secondary | ICD-10-CM | POA: Diagnosis present

## 2017-02-16 DIAGNOSIS — I5022 Chronic systolic (congestive) heart failure: Secondary | ICD-10-CM

## 2017-02-16 DIAGNOSIS — Z79899 Other long term (current) drug therapy: Secondary | ICD-10-CM | POA: Insufficient documentation

## 2017-02-16 LAB — BASIC METABOLIC PANEL
ANION GAP: 10 (ref 5–15)
BUN: 28 mg/dL — ABNORMAL HIGH (ref 6–20)
CHLORIDE: 89 mmol/L — AB (ref 101–111)
CO2: 29 mmol/L (ref 22–32)
Calcium: 8.9 mg/dL (ref 8.9–10.3)
Creatinine, Ser: 1.19 mg/dL (ref 0.61–1.24)
GFR calc non Af Amer: >60 mL/min (ref >60)
GLUCOSE: 127 mg/dL — AB (ref 65–99)
Potassium: 4.2 mmol/L (ref 3.5–5.1)
Sodium: 128 mmol/L — ABNORMAL LOW (ref 135–145)

## 2017-02-16 LAB — CBC
HEMATOCRIT: 25.2 % — AB (ref 39.0–52.0)
HEMOGLOBIN: 7.6 g/dL — AB (ref 13.0–17.0)
MCH: 23 pg — ABNORMAL LOW (ref 26.0–34.0)
MCHC: 30.2 g/dL (ref 30.0–36.0)
MCV: 76.4 fL — AB (ref 78.0–100.0)
Platelets: 188 10*3/uL (ref 150–400)
RBC: 3.3 MIL/uL — AB (ref 4.22–5.81)
RDW: 18.1 % — ABNORMAL HIGH (ref 11.5–15.5)
WBC: 6.2 10*3/uL (ref 4.0–10.5)

## 2017-02-16 LAB — PROTIME-INR
INR: 5.28 — AB
Prothrombin Time: 50 seconds — ABNORMAL HIGH (ref 11.4–15.2)

## 2017-02-16 LAB — LACTATE DEHYDROGENASE: LDH: 215 U/L — AB (ref 98–192)

## 2017-02-16 MED ORDER — FUROSEMIDE 10 MG/ML IJ SOLN: 80.0000 mg | Freq: Once | INTRAMUSCULAR | Status: DC

## 2017-02-16 MED ORDER — SODIUM CHLORIDE 0.9 % IV SOLN: Freq: Once | INTRAVENOUS | Status: DC

## 2017-02-16 NOTE — Progress Notes (Signed)
Comprehensive Clinical Assessment (CCA) Note  02/16/2017 Ronald Miller. [id number]  Visit Diagnosis:      ICD-9-CM ICD-10-CM   1. Cocaine use disorder, moderate, in early remission ([place]) 305.63 F14.21       CCA Part One  Part One has been completed on paper by the patient.  (See scanned document in Chart Review)  CCA Part Two A  Intake/Chief Complaint:  CCA Intake With Chief Complaint CCA Part Two Date: 02/10/17 CCA Part Two Time: 1530 Chief Complaint/Presenting Problem: Pt presented in need of assessment and evaluation for heart transplant.  Pt's doctor wants pt to seek treatment for drug addiction prior to heart transplant. Patients Currently Reported Symptoms/Problems: Pt experiences some anxiety when he finds it hard to breathe.  Pt states he has been clean from cocaine use for 6 months and cigarettes for 7 months. Individual's Strengths: Pt has supportive family, including mother, sister, and daugter.  Pt is willing to do what is necessary to stay clean and get a new heart to live a full life. Type of Services Patient Feels Are Needed: Pt desires counseling for drug use Initial Clinical Notes/Concerns: Pt is motivated to take the steps needed to have a healthy life.  Pt is willing to attend group and NA meetings in order to stay clean.  Mental Health Symptoms Depression:     Mania:     Anxiety:   Anxiety: Worrying  Psychosis:     Trauma:     Obsessions:     Compulsions:     Inattention:     Hyperactivity/Impulsivity:     Oppositional/Defiant Behaviors:     Borderline Personality:     Other Mood/Personality Symptoms:      Mental Status Exam Appearance and self-care  Stature:  Stature: Average  Weight:  Weight: Overweight  Clothing:  Clothing: Casual  Grooming:  Grooming: Normal  Cosmetic use:  Cosmetic Use: None  Posture/gait:  Posture/Gait: Normal  Motor activity:  Motor Activity: Not Remarkable  Sensorium  Attention:  Attention: Normal  Concentration:   Concentration: Normal  Orientation:  Orientation: X5  Recall/memory:  Recall/Memory: Normal  Affect and Mood  Affect:  Affect: Appropriate  Mood:  Mood: Euthymic  Relating  Eye contact:  Eye Contact: Normal  Facial expression:  Facial Expression: Responsive  Attitude toward examiner:  Attitude Toward Examiner: Cooperative  Thought and Language  Speech flow: Speech Flow: Normal  Thought content:  Thought Content: Appropriate to mood and circumstances  Preoccupation:     Hallucinations:     Organization:     Transport planner of Knowledge:  Fund of Knowledge: Average  Intelligence:  Intelligence: Average  Abstraction:  Abstraction: Normal  Judgement:  Judgement: Normal  Reality Testing:  Pension scheme manager  Insight:  Insight: Good  Decision Making:  Decision Making: Normal  Social Functioning  Social Maturity:     Social Judgement:     Stress  Stressors:     Coping Ability:     Skill Deficits:     Supports:      Family and Psychosocial History: Family history Marital status: Divorced Does patient have children?: Yes How many children?: 1 How is patient's relationship with their children?: Close with 61yo daughter and 2.5 yo granddaughter  Childhood History:  Childhood History By whom was/is the patient raised?: Both parents Patient's description of current relationship with people who raised him/her: Pt is close with mother; she is supportive Does patient have siblings?: Yes Number of Siblings: 1  Description of patient's current relationship with siblings: Patient is close with sister; she is supportive Did patient suffer any verbal/emotional/physical/sexual abuse as a child?: No Did patient suffer from severe childhood neglect?: No Has patient ever been sexually abused/assaulted/raped as an adolescent or adult?: No  CCA Part Two B  Employment/Work Situation: Employment / Work Copywriter, advertising Employment situation: On disability Why is patient on  disability: heart condition Patient's job has been impacted by current illness: Yes Describe how patient's job has been impacted: Pt lost job in past due to cocaine use Has patient ever been in the [employer]?: No Has patient ever served in combat?: No Did You Receive Any Psychiatric Treatment/Services While in Passenger transport manager?: No Are There Guns or Other Weapons in [place]?: No  Education: Museum/gallery curator Currently Attending: N/A Last Grade Completed: 12 Did Teacher, adult education From [employer]?: Yes Did Physicist, medical?: No Did Heritage manager?: No Did You Have An Individualized Education Program (IIEP): No Did You Have Any Difficulty At School?: No  Religion: Religion/Spirituality Are You A Religious Person?: Yes What is Your Religious Affiliation?: Christian  Leisure/Recreation:    Exercise/Diet: Exercise/Diet Do You Exercise?: Yes Have You Gained or Lost A Significant Amount of Weight in the Past Six Months?: Yes-Gained Do You Follow a Special Diet?: Yes Type of Diet: low sodium Do You Have Any Trouble Sleeping?: Yes Explanation of Sleeping Difficulties: due to scared thoughts  CCA Part Two C  Alcohol/Drug Use: Alcohol / Drug Use Prescriptions: See MAR History of alcohol / drug use?: (P) Yes Substance #1 Name of Substance 1: Cocaine 1 - Age of First Use: 40s 1 - Amount (size/oz): "varies" 1 - Frequency: 1 or 2x per week 1 - Duration: approx. 10 years 1 - Last Use / Amount: 6 months ago Substance #2 Name of Substance 2: Alcohol 2 - Age of First Use: 19 2 - Last Use / Amount: 15 years ago                  CCA Part Three  ASAM's:  Six Dimensions of Multidimensional Assessment  Dimension 1:  Acute Intoxication and/or Withdrawal Potential:     Dimension 2:  Biomedical Conditions and Complications:  Dimension 2:  Comments: Not currently using but is an issue when used in past  Dimension 3:  Emotional, Behavioral, or Cognitive Conditions and  Complications:     Dimension 4:  Readiness to Change:     Dimension 5:  Relapse, Continued use, or Continued Problem Potential:     Dimension 6:  Recovery/Living Environment:      Substance use Disorder (SUD) Substance Use Disorder (SUD)  Checklist Symptoms of Substance Use: Continued use despite having a persistent/recurrent physical/psychological problem caused/exacerbated by use, Recurrent use that results in a fialure to fulfill major rule obligatinos (work, school, home), Repeated use in physically hazardous situations, Social, occupational, recreational activities given up or reduced due to use, Substance(s) often taken in large amounts or over longer times than was intended  Social Function:     Stress:     Risk Assessment- Self-Harm Potential: Risk Assessment For Self-Harm Potential Thoughts of Self-Harm: No current thoughts Method: No plan Availability of Means: No access/NA Additional Comments for Self-Harm Potential: Pt denies ever having thoughts of harming self  Risk Assessment -Dangerous to Others Potential: Risk Assessment For Dangerous to Others Potential Method: No Plan Availability of Means: No access or NA  DSM5 Diagnoses: Patient Active Problem List   Diagnosis Date  Noted  . Nausea & vomiting 01/21/2017  . Acute on chronic systolic CHF (congestive heart failure), NYHA class 4 ([place]) 11/30/2016  . LVAD (left ventricular assist device) present ([place])   . Palliative care by specialist   . Cirrhosis ([place])   . Cellulitis and abscess of leg 08/22/2016  . Heart failure ([place]) 08/22/2016  . Special screening for malignant neoplasms, colon 07/16/2016  . DNR (do not resuscitate) discussion   . Palliative care encounter   . Cardiogenic shock ([place])   . Acute systolic congestive heart failure, NYHA class 4 ([place])   . Pain in the chest   . Dyslipidemia associated with type 2 diabetes mellitus ([place])   . Hyponatremia   . Emesis   . Atypical chest pain 03/19/2016  .  Ascites 03/10/2016  . ICD (implantable cardioverter-defibrillator) in place 03/09/2016  . CHF (congestive heart failure) ([place]) 02/23/2016  . Chronic systolic heart failure ([place]) 02/04/2016  . Insomnia 02/04/2016  . Pressure ulcer 10/15/2015  . Fluid overload 10/14/2015  . Chronic cholecystitis with calculus   . COPD (chronic obstructive pulmonary disease) ([place]) 08/28/2015  . HTN (hypertension) 08/28/2015  . Depression 08/28/2015  . Elevated troponin 08/28/2015  . AICD discharge 08/28/2015  . Hypokalemia 08/28/2015  . Hypotension 08/28/2015  . Arteriosclerosis of coronary artery 03/05/2014  . IDDM (insulin dependent diabetes mellitus) ([place]) 03/05/2014  . HLD (hyperlipidemia) 03/05/2014  . Type 2 diabetes mellitus ([place]) 08/06/2009  . Cardiovascular disease 08/06/2009  . LOW BACK PAIN, CHRONIC 08/06/2009    Patient Centered Plan: Patient is on the following Treatment Plan(s):   Recommendations for Services/Supports/Treatments: Recommendations for Services/Supports/Treatments Recommendations For Services/Supports/Treatments: CD-IOP Intensive Chemical Dependency Program, Individual Therapy (Pt has an appt with CD-IOP counselor on 5/14 to determine appropriateness )  Treatment Plan Summary:  To be completed by CD-IOP therapist  Referrals to Alternative Service(s): Referred to Alternative Service(s):   Place:   Date:   Time:    Referred to Alternative Service(s):   Place:   Date:   Time:    Referred to Alternative Service(s):   Place:   Date:   Time:    Referred to Alternative Service(s):   Place:   Date:   Time:     Jenkins Rouge

## 2017-02-16 NOTE — Progress Notes (Deleted)
Psychiatric Initial Child/Adolescent Assessment   Patient Identification: Ronald Miller. MRN:  [id number] Date of Evaluation:  02/16/2017 Referral Source: *** Chief Complaint:   Chief Complaint    Addiction Problem     Visit Diagnosis:    ICD-9-CM ICD-10-CM   1. Cocaine use disorder, moderate, in early remission ([place]) 305.63 F14.21     History of Present Illness:: ***  Associated Signs/Symptoms: Depression Symptoms:  {DEPRESSION SYMPTOMS:20000} (Hypo) Manic Symptoms:  {BHH MANIC SYMPTOMS:22872} Anxiety Symptoms:  {BHH ANXIETY SYMPTOMS:22873} Psychotic Symptoms:  {BHH PSYCHOTIC SYMPTOMS:22874} PTSD Symptoms: {BHH PTSD SYMPTOMS:22875}  Past Psychiatric History: ***  Previous Psychotropic Medications: {YES/NO:21197}  Substance Abuse History in the last 12 months:  {yes no:314532}  Consequences of Substance Abuse: {BHH CONSEQUENCES OF SUBSTANCE ABUSE:22880}  Past Medical History:  Past Medical History:  Diagnosis Date  . AICD (automatic cardioverter/defibrillator) present   . ASCVD (arteriosclerotic cardiovascular disease)   . Chronic systolic CHF (congestive heart failure) ([place])   . COPD (chronic obstructive pulmonary disease) ([place])   . Coronary artery disease   . Depression   . Diabetes mellitus   . History of cocaine abuse   . Hypertension   . Presence of permanent cardiac pacemaker   . Shortness of breath dyspnea   . Suicidal ideation   . Tobacco abuse     Past Surgical History:  Procedure Laterality Date  . CARDIAC CATHETERIZATION N/A 02/12/2016   Procedure: Right/Left Heart Cath and Coronary/Graft Angiography;  Surgeon: Jolaine Artist, MD;  [facility];  Service: Cardiovascular;  Laterality: N/A;  . CARDIAC CATHETERIZATION N/A 03/22/2016   Procedure: Right Heart Cath;  Surgeon: Jolaine Artist, MD;  Location: [facility];  Service: Cardiovascular;  Laterality: N/A;  . CARDIAC CATHETERIZATION N/A 08/25/2016   Procedure: Right  Heart Cath;  Surgeon: Jolaine Artist, MD;  Location: [facility];  Service: Cardiovascular;  Laterality: N/A;  . CARDIAC CATHETERIZATION N/A 09/03/2016   Procedure: Right Heart Cath;  Surgeon: Jolaine Artist, MD;  Location: [facility];  Service: Cardiovascular;  Laterality: N/A;  . CARDIAC DEFIBRILLATOR PLACEMENT    . CARDIAC DEFIBRILLATOR PLACEMENT    . CHOLECYSTECTOMY N/A 08/30/2015   Procedure: LAPAROSCOPIC CHOLECYSTECTOMY;  Surgeon: Hubbard Robinson, MD;  Location: [name];  Service: General;  Laterality: N/A;  . COLONOSCOPY WITH PROPOFOL N/A 08/18/2016   Procedure: COLONOSCOPY WITH PROPOFOL;  Surgeon: Jerene Bears, MD;  Location: [facility];  Service: Gastroenterology;  Laterality: N/A;  . CORONARY ARTERY BYPASS GRAFT  2001  . INSERT / REPLACE / REMOVE PACEMAKER    . INSERTION OF IMPLANTABLE LEFT VENTRICULAR ASSIST DEVICE N/A 09/09/2016   Procedure: INSERTION OF IMPLANTABLE LEFT VENTRICULAR ASSIST DEVICE;  Surgeon: [name], MD;  Location: [facility];  Service: Open Heart Surgery;  Laterality: N/A;  HeartMate 3 CIRC ARREST  NITRIC OXIDE  . IR FLUORO GUIDE CV LINE RIGHT  01/28/2017  . IR GENERIC HISTORICAL  08/03/2016   IR FLUORO GUIDE CV LINE RIGHT 08/03/2016 [name], MD [facility]  . IR GENERIC HISTORICAL  12/14/2016   IR FLUORO GUIDE CV LINE RIGHT 12/14/2016 [name], MD [facility]  . IR US GUIDE VASC ACCESS RIGHT  01/28/2017  . LUMBAR DISC SURGERY  02/1999   Discectomy and fusion  . RIGHT HEART CATH N/A 02/11/2017   Procedure: Right Heart Cath;  Surgeon: Jolaine Artist, MD;  Location: [facility];  Service: Cardiovascular;  Laterality: N/A;  .  TEE WITHOUT CARDIOVERSION N/A 09/09/2016   Procedure: TRANSESOPHAGEAL ECHOCARDIOGRAM (TEE);  Surgeon: [name], MD;  Location: [facility];  Service: Open Heart Surgery;  Laterality: N/A;  . VASECTOMY     Subsequent reversal    Family Psychiatric History: ***  Family History:   Family History  Problem Relation Age of Onset  . CAD Father   . Diabetes Father   . Heart failure Father     Social History:   Social History   Social History  . Marital status: Single    Spouse name: N/A  . Number of children: N/A  . Years of education: N/A   Occupational History  . Retired   . Truck driver    Social History Main Topics  . Smoking status: Former Smoker    Packs/day: 1.50    Years: 23.00    Types: Cigarettes    Quit date: 08/27/2015  . Smokeless tobacco: Never Used  . Alcohol use No  . Drug use: No     Comment: Former  . Sexual activity: Not Currently   Other Topics Concern  . None   Social History Narrative   Divorced with one child   No regular exercise    Additional Social History: ***   Developmental History: Prenatal History: *** Birth History: *** Postnatal Infancy: *** Developmental History: *** Milestones:  Sit-Up: ***  Crawl: ***  Walk: ***  Speech: *** School History: *** Legal History: *** Hobbies/Interests: ***  Allergies:   Allergies  Allergen Reactions  . Codeine Nausea And Vomiting    In high doses  . Trazodone And Nefazodone Other (See Comments)    Dizziness with subsequent fall   . Lipitor [Atorvastatin] Nausea Only    Nausea with high doses, tolerates 20mg  dose (08/22/16)  . Tape Itching    Paper tape is ok    Metabolic Disorder Labs: Lab Results  Component Value Date   HGBA1C 8.1 (H) 09/09/2016   MPG 186 09/09/2016   MPG 192 08/26/2016   No results found for: PROLACTIN Lab Results  Component Value Date   CHOL 153 08/15/2013   TRIG 133 08/15/2013   HDL 37 (L) 08/15/2013   VLDL 27 08/15/2013   LDLCALC 89 08/15/2013   LDLCALC 50 09/07/2012    Current Medications: Current Outpatient Prescriptions  Medication Sig Dispense Refill  . acetaminophen (TYLENOL) 325 MG tablet Take 650 mg by mouth daily as needed for moderate pain or headache.    Marland Kitchen amiodarone (PACERONE) 200 MG tablet Take 1 tablet  (200 mg total) by mouth daily. 90 tablet 6  . aspirin EC 81 MG EC tablet Take 1 tablet (81 mg total) by mouth daily. 30 tablet 6  . citalopram (CELEXA) 10 MG tablet Take 1 tablet (10 mg total) by mouth daily. 90 tablet 6  . digoxin (LANOXIN) 0.125 MG tablet Take 1 tablet (125 mcg total) by mouth daily. 90 tablet 3  . DOBUTamine (DOBUTREX) 4-5 MG/ML-% infusion Inject 531 mcg/min into the vein continuous. 250 mL 0  . docusate sodium (COLACE) 100 MG capsule Take 2 capsules (200 mg total) by mouth daily. (Patient taking differently: Take 200 mg by mouth every evening. ) 10 capsule 0  . gabapentin (NEURONTIN) 100 MG capsule Take 2 capsules (200 mg total) by mouth 2 (two) times daily. 180 capsule 3  . insulin aspart (NOVOLOG FLEXPEN) 100 UNIT/ML FlexPen Inject 10 Units into the skin 3 (three) times daily with meals. 15 mL 11  . Insulin Glargine (LANTUS  SOLOSTAR) 100 UNIT/ML Solostar Pen Inject 20 Units into the skin daily at 10 pm.    . losartan (COZAAR) 25 MG tablet TAKE 1 TABLET TWICE DAILY 180 tablet 1  . magnesium oxide (MAG-OX) 400 (241.3 Mg) MG tablet Take 1 tablet (400 mg total) by mouth daily. 30 tablet 6  . metolazone (ZAROXOLYN) 2.5 MG tablet Take 2 tablets (5 mg total) by mouth as directed. By Heart Failure Clinic .... Take on Mondays and Fridays AS NEEDED (Patient not taking: Reported on 02/08/2017) 60 tablet 6  . metolazone (ZAROXOLYN) 5 MG tablet TAKE 1 TABLET  DAILY AS NEEDED (FOR WEIGHT GREATER THAN 229) 15 tablet 3  . Multiple Vitamin (MULTIVITAMIN WITH MINERALS) TABS tablet Take 1 tablet by mouth daily.    . pantoprazole (PROTONIX) 40 MG tablet Take 1 tablet (40 mg total) by mouth daily. 30 tablet 6  . Potassium Chloride ER 20 MEQ TBCR TAKE 2 TABLETS (40 MEQ TOTAL) BY MOUTH 2 (TWO) TIMES DAILY. 360 tablet 1  . senna-docusate (SENOKOT-S) 8.6-50 MG tablet Take 2 tablets by mouth daily. (Patient taking differently: Take 2 tablets by mouth every other day. ) 60 tablet 6  . sildenafil (REVATIO)  20 MG tablet Take 2 tablets (40 mg total) by mouth 3 (three) times daily. 180 tablet 2  . spironolactone (ALDACTONE) 25 MG tablet TAKE 1 TABLET ONE TIME DAILY 90 tablet 1  . torsemide (DEMADEX) 20 MG tablet Take 3 tablets (60 mg total) by mouth 2 (two) times daily. 180 tablet 6  . traMADol (ULTRAM) 50 MG tablet Take 50 mg by mouth 3 (three) times daily as needed for severe pain.     . traZODone (DESYREL) 50 MG tablet Take 0.5 tablets (25 mg total) by mouth at bedtime as needed for sleep. 15 tablet 3  . warfarin (COUMADIN) 5 MG tablet Take 15 mg daily except for Tuesdays, Take 10 mg daily on Tuesdays. (Patient taking differently: Take 15mg s daily at night except on Tuesday take 10mg s at night) 90 tablet 6   No current facility-administered medications for this visit.     Neurologic: Headache: {BHH YES OR NO:22294} Seizure: {BHH YES OR NO:22294} Paresthesias: {BHH YES OR NO:22294}  Musculoskeletal: Strength & Muscle Tone: {desc; muscle tone:32375} Gait & Station: {PE GAIT ED QMVH:84696} Patient leans: {Patient Leans:21022755}  Psychiatric Specialty Exam: ROS  There were no vitals taken for this visit.There is no height or weight on file to calculate BMI.  General Appearance: {Appearance:22683}  Eye Contact:  {BHH EYE CONTACT:22684}  Speech:  {Speech:22685}  Volume:  {Volume (PAA):22686}  Mood:  {BHH MOOD:22306}  Affect:  {Affect (PAA):22687}  Thought Process:  {Thought Process (PAA):22688}  Orientation:  {BHH ORIENTATION (PAA):22689}  Thought Content:  {Thought Content:22690}  Suicidal Thoughts:  {ST/HT (PAA):22692}  Homicidal Thoughts:  {ST/HT (PAA):22692}  Memory:  {BHH MEMORY:22881}  Judgement:  {Judgement (PAA):22694}  Insight:  {Insight (PAA):22695}  Psychomotor Activity:  {Psychomotor (PAA):22696}  Concentration: {Concentration:21399}  Recall:  {BHH GOOD/FAIR/POOR:22877}  Fund of Knowledge: {BHH GOOD/FAIR/POOR:22877}  Language: {BHH GOOD/FAIR/POOR:22877}  Akathisia:   {BHH YES OR NO:22294}  Handed:  {Handed:22697}  AIMS (if indicated):  ***  Assets:  {Assets (PAA):22698}  ADL's:  {BHH EXB'M:84132}  Cognition: {chl bhh cognition:304700322}  Sleep:  ***     Treatment Plan Summary: {CHL AMB BH MD TX [phone]}   Desiree Fleming S, LCAS 5/16/201811:57 AM

## 2017-02-16 NOTE — Progress Notes (Signed)
Patient requested to see CSW in clinic today. Patient reports he has been going to [facility] 3x weekly for outpatient services. Patient states he is pleased and program support is helping. Patient did share financial burden of gas driving back and forth to appointments and in need of some assistance. Patient also noted that he was approved for [employer] yesterday. "Things are finally going my way". CSW discussed options for support and will follow up with a [employer] gift card next week to assist with gas to get him over the hump until next month. Patient grateful for assistance and will continue to follow through with outpatient mental health services. CSW continues to be available as needed. Raquel Sarna, [place], [place]

## 2017-02-16 NOTE — Patient Instructions (Signed)
Plan: 1. Stop Tramadol. 2. Schedule for 2units PRBC in SS tomorrow. 3. RTC next week for labs and visit

## 2017-02-16 NOTE — Progress Notes (Signed)
Patient presents for hospital d/c  follow up in [facility] today. Reports no problems with VAD equipment or concerns with drive line.  Vital Signs:  Doppler Pressure 98   Automatc BP: 134/65 (96) HR:87   SPO2:96  %  Weight: 236.8 lb w/o eqt Last weight: 243.4 lb Home weights: 230 lbs   VAD Indication: Destination Therapy- eval ongoing at [facility]  VAD interrogation & Equipment Management: Speed:5900 Flow: 5.4 Power:4.7 w    PI:3.3  Alarms: 2-NO External Power alarms 5/14-and 5/15 Events: 3-5 PI events daily  Fixed speed 5900 Low speed limit: 5600  Primary Controller:  Replace back up battery in 61months. Back up controller:   Replace back up battery in 25 months.  Annual Equipment Maintenance on UBC/PM was performed on 09/2016.   I reviewed the LVAD parameters from today and compared the results to the patient's prior recorded data. LVAD interrogation was NEGATIVE for significant power changes, NEGATIVE for clinical alarms and STABLE for PI events/speed drops. No programming changes were made and pump is functioning within specified parameters. Pt is performing daily controller and system monitor self tests along with completing weekly and monthly maintenance for LVAD equipment.  LVAD equipment check completed and is in good working order. Back-up equipment present. Charged back up battery and performed self-test on equipment.   Exit Site Care: Drive line is being maintained weekly  by sister. Drive line exit site well healed and incorporated. The velour is fully implanted at exit site. Dressing dry and intact. No erythema or drainage. Stabilization device present and accurately applied. Pt denies fever or chills. Pt states they have adequate dressing supplies at home. 8 weekly dressing changes given at this visit.  Significant Events on VAD Support:  11/30/16> Milrinone gtt at discharge 01/27/17> dobutamine gtt at dischage  Device:Protect Therapies: off Last check:  01/03/17  BP & Labs:  MAP 84 - Doppler is reflecting MAP  Hgb 7.6 - Pt is increasingly SOB.Marland Kitchen Specifically denies melena/BRBPR or nosebleeds. WILL  Arrange for blood tomorrow.   LDH stable at 215 with established baseline of 180- 250. Denies tea-colored urine. No power elevations noted on interrogation.   Plan: 1. Stop Tramadol. 2. Schedule for 2units PRBC in SS tomorrow. 3. RTC next week for labs and visit.  Tanda Rockers RN [place] Coordinator   Office: [phone] 24/7 Emergency VAD Pager: [phone]
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
## 2017-02-17 ENCOUNTER — Other Ambulatory Visit (HOSPITAL_COMMUNITY): Payer: Medicare HMO

## 2017-02-17 ENCOUNTER — Encounter (HOSPITAL_COMMUNITY): Payer: Self-pay | Admitting: *Deleted

## 2017-02-17 ENCOUNTER — Encounter (HOSPITAL_COMMUNITY)
Admission: RE | Admit: 2017-02-17 | Discharge: 2017-02-17 | Disposition: A | Discharge status: Home / Self Care | Payer: Medicare HMO | Source: Ambulatory Visit | Attending: Internal Medicine | Admitting: Internal Medicine

## 2017-02-17 ENCOUNTER — Other Ambulatory Visit (INDEPENDENT_AMBULATORY_CARE_PROVIDER_SITE_OTHER): Payer: Self-pay | Admitting: Physician Assistant

## 2017-02-17 DIAGNOSIS — D649 Anemia, unspecified: Secondary | ICD-10-CM | POA: Diagnosis not present

## 2017-02-17 LAB — PREPARE RBC (CROSSMATCH)

## 2017-02-17 IMAGING — CR DG CHEST 1V PORT
1 series · 1 of 1 positions shown · non-contrast
Comparison: Chest radiograph April 27, 2015; chest CT April 28, 2015

CLINICAL DATA: Shortness of breath with chest pain and cough

EXAM:
PORTABLE CHEST 1 VIEW

[ap]
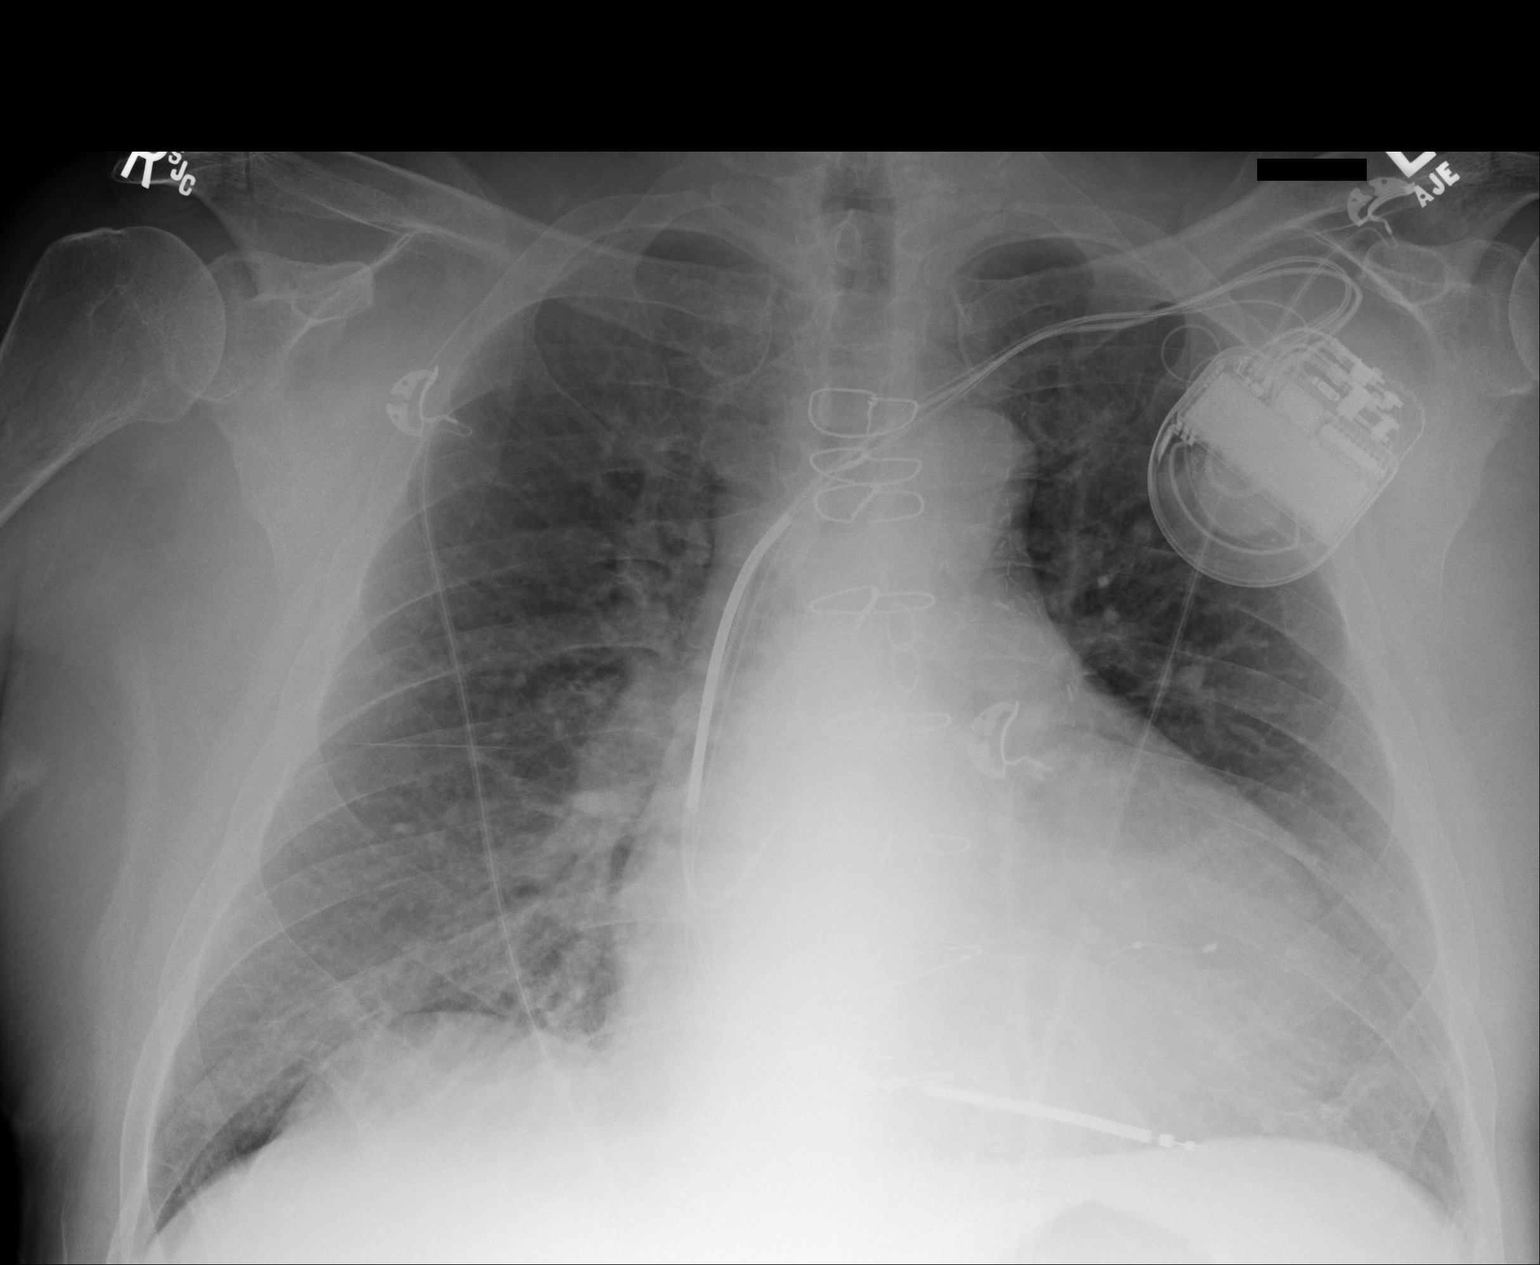

[1 of 1 positions shown; findings below may reference images not displayed]

FINDINGS: There is no edema or consolidation. There is cardiomegaly with
pulmonary vascularity within normal limits. Defibrillator leads are
attached to the right atrium, right ventricle, and left ventricle.
No pneumothorax. Lymph node prominence seen on recent CT is not
appreciable radiographically.
IMPRESSION: Cardiomegaly.  No edema or consolidation.

## 2017-02-17 MED ORDER — FUROSEMIDE 10 MG/ML IJ SOLN
80.0000 mg | Freq: Once | INTRAMUSCULAR | Status: AC
  Administered 2017-02-17: 80 mg via INTRAVENOUS

## 2017-02-17 MED ORDER — SODIUM CHLORIDE 0.9 % IV SOLN
Freq: Once | INTRAVENOUS | Status: AC
  Administered 2017-02-17: 10:00:00 via INTRAVENOUS

## 2017-02-17 MED ORDER — ACETAMINOPHEN 500 MG PO TABS
1000.0000 mg | ORAL_TABLET | Freq: Four times a day (QID) | ORAL | Status: DC | PRN
  Administered 2017-02-17: 1000 mg via ORAL

## 2017-02-17 MED ORDER — ACETAMINOPHEN 500 MG PO TABS
ORAL_TABLET | ORAL | Status: AC
  Filled 2017-02-17: qty 2

## 2017-02-17 MED ORDER — FUROSEMIDE 10 MG/ML IJ SOLN
INTRAMUSCULAR | Status: AC
  Administered 2017-02-17: 80 mg via INTRAVENOUS
  Filled 2017-02-17: qty 8

## 2017-02-17 NOTE — Progress Notes (Signed)
Patient received blood today in short stay. States he feels better. After visit, he presented to clinic with reports of dark stools. Patient given occult blood card and instructed how to collect sample. Will bring back to clinic tomorrow or Monday.  Balinda Quails RN, VAD Coordinator 24/7 pager [phone]

## 2017-02-17 NOTE — Progress Notes (Signed)
Advanced Heart Failure Clinic Note    Referring Provider: Darylene Price, FNP Primary Care: [facility] Primary Cardiologist: Dr Haroldine Laws.  Transplant Center- [facility]  HPI: Ronald Miller. is a 61 y.o. male with history of chronic systolic HF s/p Medtronic ICD 2014, CAD s/p CABG in 2000, HTN, Hx of cocaine abuse, Tobacco abuse, Depression, PAF, and COPD. HM3 placed 09/09/2016.   Admitted 11/2016 with volume overload and low output heart failure. Diuresed with IV lasix and placed on milrinone. Discharged on milrinone.   Admitted 01/21/2017 with volume overload. Mixed venous saturation was low so milrinone was increased to 0.375 mcg. Milrinone was stopped with persistently low mixed venous saturation so dobtuamine was started. He discharged on 0.5 mcg dobutamine. Hospital course complicated by RUE swelling. US showed non occlusive DVT in R brachial vein. PICC removed and later tunneled PICC was placed. Discharge weight 233 pounds.   Underwent ramp RHC on 02/11/17 which showed primarily RV failure with normal output.   VAD speed 5700  On dobutamine 5 mcg/kg/min  RA = 17 RV =  44/18 PA =  41/15 (27) PCW = 16 Fick cardiac output/index = 8.1/3.7 Thermo CO/CI = 6.7/3.0 Ao sat = 95% PA sat = 62%, 62%  VAD speed 5900  RA = 16 PA =  44/15 (28) PCW = 15 Fick cardiac output/index = 8.1/3.7 Ao = 95% PA sat = 65%  Today he returns to LV[facility]/HF Clinic for another work-in visit. Recently seen by substance abuse counselor as part of transplant work-up reuirements who suggested stopping tramadol. 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 Continues to complain of severe fatigue and dyspnea with just mild exertion. Denies PND/Orthopnea. No CP. Says he is taking all meds as prescribed. Minimal LE edema. Taking all medications. He has not required metolazone. No BRBPR. AHC following for home dobutamine. No fever or chills. Denies tenderness around PICC.  Seen at [facility] by  Dr. Hoyt Koch and they are requiring the following for him to qualify for transplant: 1) Complete cessation of tobacco and substance abuse 2) Counseling for depression and substance abuse 3) Get Hgba1c < 8.0 (was 8.4) 4) Keep BMI < 35   Denies LVAD trauma. Denies driveline trauma, erythema, or drainage. No ICD shocks.   Reports taking Coumadin as prescribed and adherence to anticoagulation based dietary restrictions. Denies bright red blood per rectum or melena, no dark urine or hematuria.   VAD Indication: Destination Therapy- eval ongoing at [facility]  VAD interrogation & Equipment Management: Speed:5900 Flow: 5.4 Power:4.7 w PI:3.3  Alarms: 2-NO External Power alarms 5/14-and 5/15 Events: 3-5 PI events daily  Fixed speed 5900 Low speed limit: 5600  Past Medical History:  Diagnosis Date  . AICD (automatic cardioverter/defibrillator) present   . ASCVD (arteriosclerotic cardiovascular disease)   . Chronic systolic CHF (congestive heart failure) ([place])   . COPD (chronic obstructive pulmonary disease) ([place])   . Coronary artery disease   . Depression   . Diabetes mellitus   . History of cocaine abuse   . Hypertension   . Presence of permanent cardiac pacemaker   . Shortness of breath dyspnea   . Suicidal ideation   . Tobacco abuse     Current Outpatient Prescriptions  Medication Sig Dispense Refill  . acetaminophen (TYLENOL) 325 MG tablet Take 650 mg by mouth daily as needed for moderate pain or headache.    Marland Kitchen amiodarone (PACERONE) 200 MG tablet Take 1 tablet (200 mg total) by mouth daily. [place]  tablet 6  . aspirin EC 81 MG EC tablet Take 1 tablet (81 mg total) by mouth daily. 30 tablet 6  . citalopram (CELEXA) 10 MG tablet Take 1 tablet (10 mg total) by mouth daily. 90 tablet 6  . digoxin (LANOXIN) 0.125 MG tablet Take 1 tablet (125 mcg total) by mouth daily. 90 tablet 3  . DOBUTamine (DOBUTREX) 4-5 MG/ML-% infusion Inject 531 mcg/min into the vein continuous. 250 mL 0  .  docusate sodium (COLACE) 100 MG capsule Take 2 capsules (200 mg total) by mouth daily. (Patient taking differently: Take 200 mg by mouth every evening. ) 10 capsule 0  . gabapentin (NEURONTIN) 100 MG capsule Take 2 capsules (200 mg total) by mouth 2 (two) times daily. 180 capsule 3  . insulin aspart (NOVOLOG FLEXPEN) 100 UNIT/ML FlexPen Inject 10 Units into the skin 3 (three) times daily with meals. 15 mL 11  . Insulin Glargine (LANTUS SOLOSTAR) 100 UNIT/ML Solostar Pen Inject 20 Units into the skin daily at 10 pm.    . losartan (COZAAR) 25 MG tablet TAKE 1 TABLET TWICE DAILY 180 tablet 1  . magnesium oxide (MAG-OX) 400 (241.3 Mg) MG tablet Take 1 tablet (400 mg total) by mouth daily. 30 tablet 6  . Multiple Vitamin (MULTIVITAMIN WITH MINERALS) TABS tablet Take 1 tablet by mouth daily.    . pantoprazole (PROTONIX) 40 MG tablet Take 1 tablet (40 mg total) by mouth daily. 30 tablet 6  . Potassium Chloride ER 20 MEQ TBCR TAKE 2 TABLETS (40 MEQ TOTAL) BY MOUTH 2 (TWO) TIMES DAILY. 360 tablet 1  . senna-docusate (SENOKOT-S) 8.6-50 MG tablet Take 2 tablets by mouth daily. (Patient taking differently: Take 2 tablets by mouth every other day. ) 60 tablet 6  . sildenafil (REVATIO) 20 MG tablet Take 2 tablets (40 mg total) by mouth 3 (three) times daily. 180 tablet 2  . spironolactone (ALDACTONE) 25 MG tablet TAKE 1 TABLET ONE TIME DAILY 90 tablet 1  . torsemide (DEMADEX) 20 MG tablet Take 3 tablets (60 mg total) by mouth 2 (two) times daily. 180 tablet 6  . traMADol (ULTRAM) 50 MG tablet Take 50 mg by mouth 3 (three) times daily as needed for severe pain.     Marland Kitchen warfarin (COUMADIN) 5 MG tablet Take 15 mg daily except for Tuesdays, Take 10 mg daily on Tuesdays. (Patient taking differently: Take 15mg s daily at night except on Tuesday take 10mg s at night) 90 tablet 6  . metolazone (ZAROXOLYN) 2.5 MG tablet Take 2 tablets (5 mg total) by mouth as directed. By Heart Failure Clinic .... Take on Mondays and Fridays AS  NEEDED (Patient not taking: Reported on 02/08/2017) 60 tablet 6  . metolazone (ZAROXOLYN) 5 MG tablet TAKE 1 TABLET  DAILY AS NEEDED (FOR WEIGHT GREATER THAN 229) (Patient not taking: Reported on 02/16/2017) 15 tablet 3  . traZODone (DESYREL) 50 MG tablet Take 0.5 tablets (25 mg total) by mouth at bedtime as needed for sleep. (Patient not taking: Reported on 02/16/2017) 15 tablet 3   Current Facility-Administered Medications  Medication Dose Route Frequency Provider Last Rate Last Dose  . 0.9 %  sodium chloride infusion   Intravenous Once Kyleigh Nannini, Shaune Pascal, MD      . furosemide (LASIX) injection 80 mg  80 mg Intravenous Once Lydia Meng, Shaune Pascal, MD        Codeine; Trazodone and nefazodone; Lipitor [atorvastatin]; and Tape  REVIEW OF SYSTEMS: All systems negative except as listed in HPI, PMH and  Problem list.  Vital Signs:  Doppler Pressure 98                Automatc BP: 134/65 (96) HR:87  SPO2:96  %  Weight: 236.8 lb w/o eqt Last weight: 243.4 lb Home weights: 230 lbs    Vitals:   02/16/17 1209 02/16/17 1210  BP: 134/65 (!) 98/0  Pulse: 87    Vitals:   02/16/17 1209 02/16/17 1210  BP: 134/65 (!) 98/0  Pulse: 87     Wt Readings from Last 3 Encounters:  02/16/17 236 lb 12.8 oz (107.4 kg)  02/11/17 234 lb (106.1 kg)  02/07/17 242 lb 6.4 oz (110 kg)    Physical Exam: GENERAL: Obese male. Pale. SOB on ambulation HEENT: normal  NECK: Supple, JVP 9-10 .  2+ bilaterally, no bruits.  No lymphadenopathy or thyromegaly appreciated.   CARDIAC:  Mechanical heart sounds with LVAD hum present. Normal HM-3 sounds. R upper chest tunneled PICC. Site is clear,  LUNGS:  Clear to auscultation bilaterally. No wheeze or rales  ABDOMEN: Obese. Soft, round, NT/ND Good BS    LVAD exit site: well-healed and incorporated.  Dressing dry and intact.  No erythema or drainage.  Stabilization device present and accurately applied.  Driveline dressing is being changed daily per sterile technique.  No signs infections EXTREMITIES:  Warm and dry, no cyanosis, clubbing. No rash Trace-1+ edema at most.   NEUROLOGIC:  Alert and oriented x 4.  Gait steady.  No aphasia.  No dysarthria.  Anxious   ASSESSMENT AND PLAN:  1. Chronic end-stage biventricular systolic HF: LVEF 83% due to ICM -> s/p Echo 08/24/16 LVEF 15%, RV mild dilated, moderately reduced. --> S/P HM3 LVAD 38/11/5051.  - Complicated by RV failure. Initially on milrinone but stopped due to low mixed venous saturation.  Now on dobutamine 5 mcg.  - Recent RHC confirmed RV failure as primary issue. Sildenafil increased - Volume status looks ok today. Weight is down - Acute issues seems to be low hgb (7.6). Denies obviously bleeding despite INR 5.3 - Will hold coumadin. Bring in to short stay tomorrow for 2u RBCs with 80mg  IV lasix. Watch closely for evidence of GI bleeding.  - No bb with RV failure and low output. Continue revatio 40 mg three times a day. - Continue digoxin 0.125 mg daily  - Stable MAP and VAD parameters. Continue losartan 25 mg daily - VAD parameters stable.  - Warfarin with goal INR 2.0 - 2.5 + ASA 81 daily. INR 5.3 today. Discussed with PharmD who will adjust warfarin.  - Avoid lovenox in future due to epistaxis.  - Revieweded transplant guidelines set forth by Dr. Hoyt Koch with him. He has seen a substance abuse counselor. Agree with stopping tramadol. Can use XS tylenol as needed.  2. CAD: No CP. 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  - No s/s of ischemia. Continue current regimen. 3. DMII:  - Hgb A1C 8.3 on 08/26/2016. No change. Needs follow up with PCP.  - Stressed need for better control as part of transplant guidelines. May benefit from Jardiance 4. Tobacco abuse:  - He has stopped smoking.  5. Atrial fibrillation, paroxysmal - In NSR. Continue amio 200 mg daily.  6. Left pleural effusion - underwent repeat thoracentesis in Jan. 2018.  Resolved 01/21/17 CXR 7.   Anxiety/Depression - Continue Celexa 10mg  daily  - HF SW trying to get him plugged in with counselor 8. RUE DVT- identified  on [place]. Continue anticoagulants. 9. Hyponatremia:  -Sodium 128. Asymptomatic. Restrict free water. 10. Anemia:  Hgb 7.6.  No S/S of bleeding.   As above will bring in to short stay for RBCs tomorrow. Watch closely for GI bleeding. Hold coumadin   Glori Bickers, MD  12:02 AM

## 2017-02-18 LAB — TYPE AND SCREEN
ABO/RH(D): O POS
Antibody Screen: NEGATIVE
Unit division: 0
Unit division: 0

## 2017-02-18 LAB — BPAM RBC
BLOOD PRODUCT EXPIRATION DATE: 201806112359
Blood Product Expiration Date: 201806112359
ISSUE DATE / TIME: 201805170921
ISSUE DATE / TIME: 201805171154
UNIT TYPE AND RH: 5100
Unit Type and Rh: 5100

## 2017-02-18 NOTE — Telephone Encounter (Signed)
Pt need an appt hasn't been seen since 2016

## 2017-02-21 ENCOUNTER — Encounter (HOSPITAL_COMMUNITY): Payer: Self-pay | Admitting: Medical

## 2017-02-21 ENCOUNTER — Other Ambulatory Visit (HOSPITAL_COMMUNITY): Payer: Medicare HMO | Attending: Psychiatry | Admitting: Psychology

## 2017-02-21 ENCOUNTER — Other Ambulatory Visit (HOSPITAL_COMMUNITY): Payer: Self-pay | Admitting: Internal Medicine

## 2017-02-21 ENCOUNTER — Ambulatory Visit (HOSPITAL_COMMUNITY)
Admission: RE | Admit: 2017-02-21 | Discharge: 2017-02-21 | Disposition: A | Discharge status: Home / Self Care | Payer: Medicare HMO | Source: Ambulatory Visit | Attending: Cardiology | Admitting: Cardiology

## 2017-02-21 VITALS — BP 90/66 | HR 80 | Resp 20 | Ht 69.0 in | Wt 228.2 lb

## 2017-02-21 DIAGNOSIS — Z6372 Alcoholism and drug addiction in family: Secondary | ICD-10-CM

## 2017-02-21 DIAGNOSIS — K760 Fatty (change of) liver, not elsewhere classified: Secondary | ICD-10-CM

## 2017-02-21 DIAGNOSIS — E119 Type 2 diabetes mellitus without complications: Secondary | ICD-10-CM | POA: Diagnosis not present

## 2017-02-21 DIAGNOSIS — Z9049 Acquired absence of other specified parts of digestive tract: Secondary | ICD-10-CM

## 2017-02-21 DIAGNOSIS — I201 Angina pectoris with documented spasm: Secondary | ICD-10-CM

## 2017-02-21 DIAGNOSIS — D500 Iron deficiency anemia secondary to blood loss (chronic): Secondary | ICD-10-CM | POA: Diagnosis not present

## 2017-02-21 DIAGNOSIS — F4312 Post-traumatic stress disorder, chronic: Secondary | ICD-10-CM | POA: Diagnosis not present

## 2017-02-21 DIAGNOSIS — E785 Hyperlipidemia, unspecified: Secondary | ICD-10-CM | POA: Diagnosis not present

## 2017-02-21 DIAGNOSIS — J449 Chronic obstructive pulmonary disease, unspecified: Secondary | ICD-10-CM

## 2017-02-21 DIAGNOSIS — Z95811 Presence of heart assist device: Secondary | ICD-10-CM

## 2017-02-21 DIAGNOSIS — Z794 Long term (current) use of insulin: Secondary | ICD-10-CM | POA: Diagnosis not present

## 2017-02-21 DIAGNOSIS — T7492XS Unspecified child maltreatment, confirmed, sequela: Secondary | ICD-10-CM

## 2017-02-21 DIAGNOSIS — Z8719 Personal history of other diseases of the digestive system: Secondary | ICD-10-CM | POA: Diagnosis not present

## 2017-02-21 DIAGNOSIS — I5023 Acute on chronic systolic (congestive) heart failure: Secondary | ICD-10-CM

## 2017-02-21 DIAGNOSIS — R195 Other fecal abnormalities: Secondary | ICD-10-CM | POA: Diagnosis present

## 2017-02-21 DIAGNOSIS — F1421 Cocaine dependence, in remission: Secondary | ICD-10-CM | POA: Diagnosis not present

## 2017-02-21 DIAGNOSIS — I10 Essential (primary) hypertension: Secondary | ICD-10-CM | POA: Diagnosis not present

## 2017-02-21 DIAGNOSIS — IMO0001 Reserved for inherently not codable concepts without codable children: Secondary | ICD-10-CM

## 2017-02-21 DIAGNOSIS — F17200 Nicotine dependence, unspecified, uncomplicated: Secondary | ICD-10-CM

## 2017-02-21 DIAGNOSIS — R112 Nausea with vomiting, unspecified: Secondary | ICD-10-CM

## 2017-02-21 LAB — OCCULT BLOOD X 1 CARD TO LAB, STOOL: FECAL OCCULT BLD: POSITIVE — AB
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
## 2017-02-22 ENCOUNTER — Ambulatory Visit (HOSPITAL_COMMUNITY)
Admission: RE | Admit: 2017-02-22 | Discharge: 2017-02-22 | Disposition: A | Discharge status: Home / Self Care | Payer: Medicare HMO | Source: Ambulatory Visit | Attending: Internal Medicine | Admitting: Internal Medicine

## 2017-02-22 ENCOUNTER — Other Ambulatory Visit (HOSPITAL_COMMUNITY): Payer: Self-pay | Admitting: *Deleted

## 2017-02-22 ENCOUNTER — Ambulatory Visit (HOSPITAL_COMMUNITY): Payer: Self-pay | Admitting: Pharmacist

## 2017-02-22 DIAGNOSIS — Z95811 Presence of heart assist device: Secondary | ICD-10-CM

## 2017-02-22 LAB — BASIC METABOLIC PANEL
Anion gap: 10 (ref 5–15)
BUN: 19 mg/dL (ref 6–20)
CO2: 25 mmol/L (ref 22–32)
CREATININE: 1.18 mg/dL (ref 0.61–1.24)
Calcium: 8.8 mg/dL — ABNORMAL LOW (ref 8.9–10.3)
Chloride: 94 mmol/L — ABNORMAL LOW (ref 101–111)
GFR calc non Af Amer: >60 mL/min (ref >60)
Glucose, Bld: 209 mg/dL — ABNORMAL HIGH (ref 65–99)
Potassium: 4.3 mmol/L (ref 3.5–5.1)
Sodium: 129 mmol/L — ABNORMAL LOW (ref 135–145)

## 2017-02-22 LAB — LACTATE DEHYDROGENASE: LDH: 259 U/L — AB (ref 98–192)

## 2017-02-22 LAB — PROTIME-INR
INR: 2.01
PROTHROMBIN TIME: 23 s — AB (ref 11.4–15.2)

## 2017-02-22 LAB — CBC
HCT: 28.9 % — ABNORMAL LOW (ref 39.0–52.0)
Hemoglobin: 8.7 g/dL — ABNORMAL LOW (ref 13.0–17.0)
MCH: 23.8 pg — AB (ref 26.0–34.0)
MCHC: 30.1 g/dL (ref 30.0–36.0)
MCV: 79.2 fL (ref 78.0–100.0)
Platelets: 184 10*3/uL (ref 150–400)
RBC: 3.65 MIL/uL — AB (ref 4.22–5.81)
RDW: 18.2 % — AB (ref 11.5–15.5)
WBC: 7.9 10*3/uL (ref 4.0–10.5)

## 2017-02-22 MED ORDER — ONDANSETRON HCL 4 MG PO TABS: 4.0000 mg | ORAL_TABLET | Freq: Four times a day (QID) | ORAL | 0 refills | Status: DC | PRN

## 2017-02-22 MED FILL — ONDANSETRON HCL 4 MG TABLET: 4 | 5 days supply | Qty: 20 | Fill #0

## 2017-02-22 NOTE — Progress Notes (Signed)
Patient arrived to heart failure clinic today requesting to be seen for sever nausea and dry-heaving. Patient states he is "up" in weight and is unable to take his meds. Labs drawn and stable. Patient appears very anxious and is requesting medications for his back. Patient assessed and stable. Discharged with prescription for Zofran.    Balinda Quails RN, VAD Coordinator 24/7 pager [phone]

## 2017-02-22 NOTE — Addendum Note (Signed)
Encounter addended by: Kerry Dory, CMA on: 02/22/2017  1:01 PM<BR>    Actions taken: Order list changed

## 2017-02-22 NOTE — Addendum Note (Signed)
Encounter addended by: Candy Sledge, RN on: 02/22/2017  3:57 PM<BR>    Actions taken: Sign clinical note

## 2017-02-23 ENCOUNTER — Other Ambulatory Visit (HOSPITAL_COMMUNITY): Payer: Medicare HMO

## 2017-02-23 ENCOUNTER — Encounter (HOSPITAL_COMMUNITY): Payer: Self-pay

## 2017-02-23 ENCOUNTER — Encounter (HOSPITAL_COMMUNITY): Payer: Self-pay | Admitting: Psychology

## 2017-02-24 ENCOUNTER — Telehealth (HOSPITAL_COMMUNITY): Payer: Self-pay | Admitting: Psychology

## 2017-02-24 ENCOUNTER — Other Ambulatory Visit (HOSPITAL_COMMUNITY): Payer: Medicare HMO

## 2017-02-24 NOTE — Progress Notes (Signed)
Ronald Miller. is a 61 y.o. male patient. CD-IOP: Excused Absence. The patient phoned and left a message with the nurse reporting he was 'ill' and would not be in group today. He had attended his first group session on Monday and met with the Investment banker, operational. He is struggling with many health issues, including being here in treatment to satisfy requirements that will allow him to be placed on the heart transplant list. A drug test was collected from the patient on Monday. We will look for him tomorrow in group.         Brandon Melnick, LCAS

## 2017-02-27 ENCOUNTER — Encounter (HOSPITAL_BASED_OUTPATIENT_CLINIC_OR_DEPARTMENT_OTHER): Payer: Medicare HMO

## 2017-03-01 ENCOUNTER — Other Ambulatory Visit (HOSPITAL_COMMUNITY): Payer: Self-pay | Admitting: Unknown Physician Specialty

## 2017-03-01 ENCOUNTER — Telehealth: Payer: Self-pay | Admitting: Unknown Physician Specialty

## 2017-03-01 ENCOUNTER — Other Ambulatory Visit (HOSPITAL_COMMUNITY): Payer: Self-pay

## 2017-03-01 ENCOUNTER — Telehealth (HOSPITAL_COMMUNITY): Payer: Self-pay | Admitting: Psychology

## 2017-03-01 DIAGNOSIS — Z95811 Presence of heart assist device: Secondary | ICD-10-CM

## 2017-03-01 NOTE — Telephone Encounter (Signed)
Pt called to report a weight gain over the weekend of 8 pounds. Pt took metolazone on Sunday and Monday and is down 6 pounds. Pt is c/o SOB and chest tightness. Pt instructed to take another metolazone tomorrow morning and report for labs tomorrow at 1115 per Dr. Haroldine Laws.

## 2017-03-02 ENCOUNTER — Telehealth (HOSPITAL_COMMUNITY): Payer: Self-pay | Admitting: *Deleted

## 2017-03-02 ENCOUNTER — Ambulatory Visit (HOSPITAL_COMMUNITY): Payer: Self-pay | Admitting: Pharmacist

## 2017-03-02 ENCOUNTER — Other Ambulatory Visit (HOSPITAL_COMMUNITY): Payer: Medicare HMO

## 2017-03-02 ENCOUNTER — Ambulatory Visit (HOSPITAL_COMMUNITY)
Admission: RE | Admit: 2017-03-02 | Discharge: 2017-03-02 | Disposition: A | Discharge status: Home / Self Care | Payer: Medicare HMO | Source: Ambulatory Visit | Attending: Cardiology | Admitting: Cardiology

## 2017-03-02 ENCOUNTER — Encounter (HOSPITAL_COMMUNITY): Payer: Self-pay | Admitting: Psychology

## 2017-03-02 DIAGNOSIS — Z95811 Presence of heart assist device: Secondary | ICD-10-CM

## 2017-03-02 LAB — BASIC METABOLIC PANEL
ANION GAP: 10 (ref 5–15)
BUN: 42 mg/dL — AB (ref 6–20)
CO2: 34 mmol/L — ABNORMAL HIGH (ref 22–32)
Calcium: 8.8 mg/dL — ABNORMAL LOW (ref 8.9–10.3)
Chloride: 79 mmol/L — ABNORMAL LOW (ref 101–111)
Creatinine, Ser: 1.55 mg/dL — ABNORMAL HIGH (ref 0.61–1.24)
GFR calc Af Amer: 54 mL/min — ABNORMAL LOW (ref >60)
GFR calc non Af Amer: 47 mL/min — ABNORMAL LOW (ref >60)
GLUCOSE: 243 mg/dL — AB (ref 65–99)
POTASSIUM: 3.6 mmol/L (ref 3.5–5.1)
Sodium: 123 mmol/L — ABNORMAL LOW (ref 135–145)

## 2017-03-02 LAB — CBC
HEMATOCRIT: 28.3 % — AB (ref 39.0–52.0)
HEMOGLOBIN: 8.7 g/dL — AB (ref 13.0–17.0)
MCH: 23.3 pg — ABNORMAL LOW (ref 26.0–34.0)
MCHC: 30.7 g/dL (ref 30.0–36.0)
MCV: 75.9 fL — AB (ref 78.0–100.0)
Platelets: 218 10*3/uL (ref 150–400)
RBC: 3.73 MIL/uL — ABNORMAL LOW (ref 4.22–5.81)
RDW: 17.9 % — AB (ref 11.5–15.5)
WBC: 7.2 10*3/uL (ref 4.0–10.5)

## 2017-03-02 LAB — PROTIME-INR
INR: 2.77
Prothrombin Time: 29.8 seconds — ABNORMAL HIGH (ref 11.4–15.2)

## 2017-03-02 LAB — LACTATE DEHYDROGENASE: LDH: 238 U/L — ABNORMAL HIGH (ref 98–192)

## 2017-03-02 IMAGING — CR DG CHEST 2V
1 series · 2 of 2 positions shown · non-contrast
Comparison: 09/30/2015 radiographs.  CT 08/29/2015.

CLINICAL DATA: Bilateral lower extremity edema for 2 days. History
of congestive heart failure, hypertension and COPD.

EXAM:
CHEST  2 VIEW

[Series 1: w chest pa · 0.14mm/px · 2 of 2 slices shown]
[im 1/2]
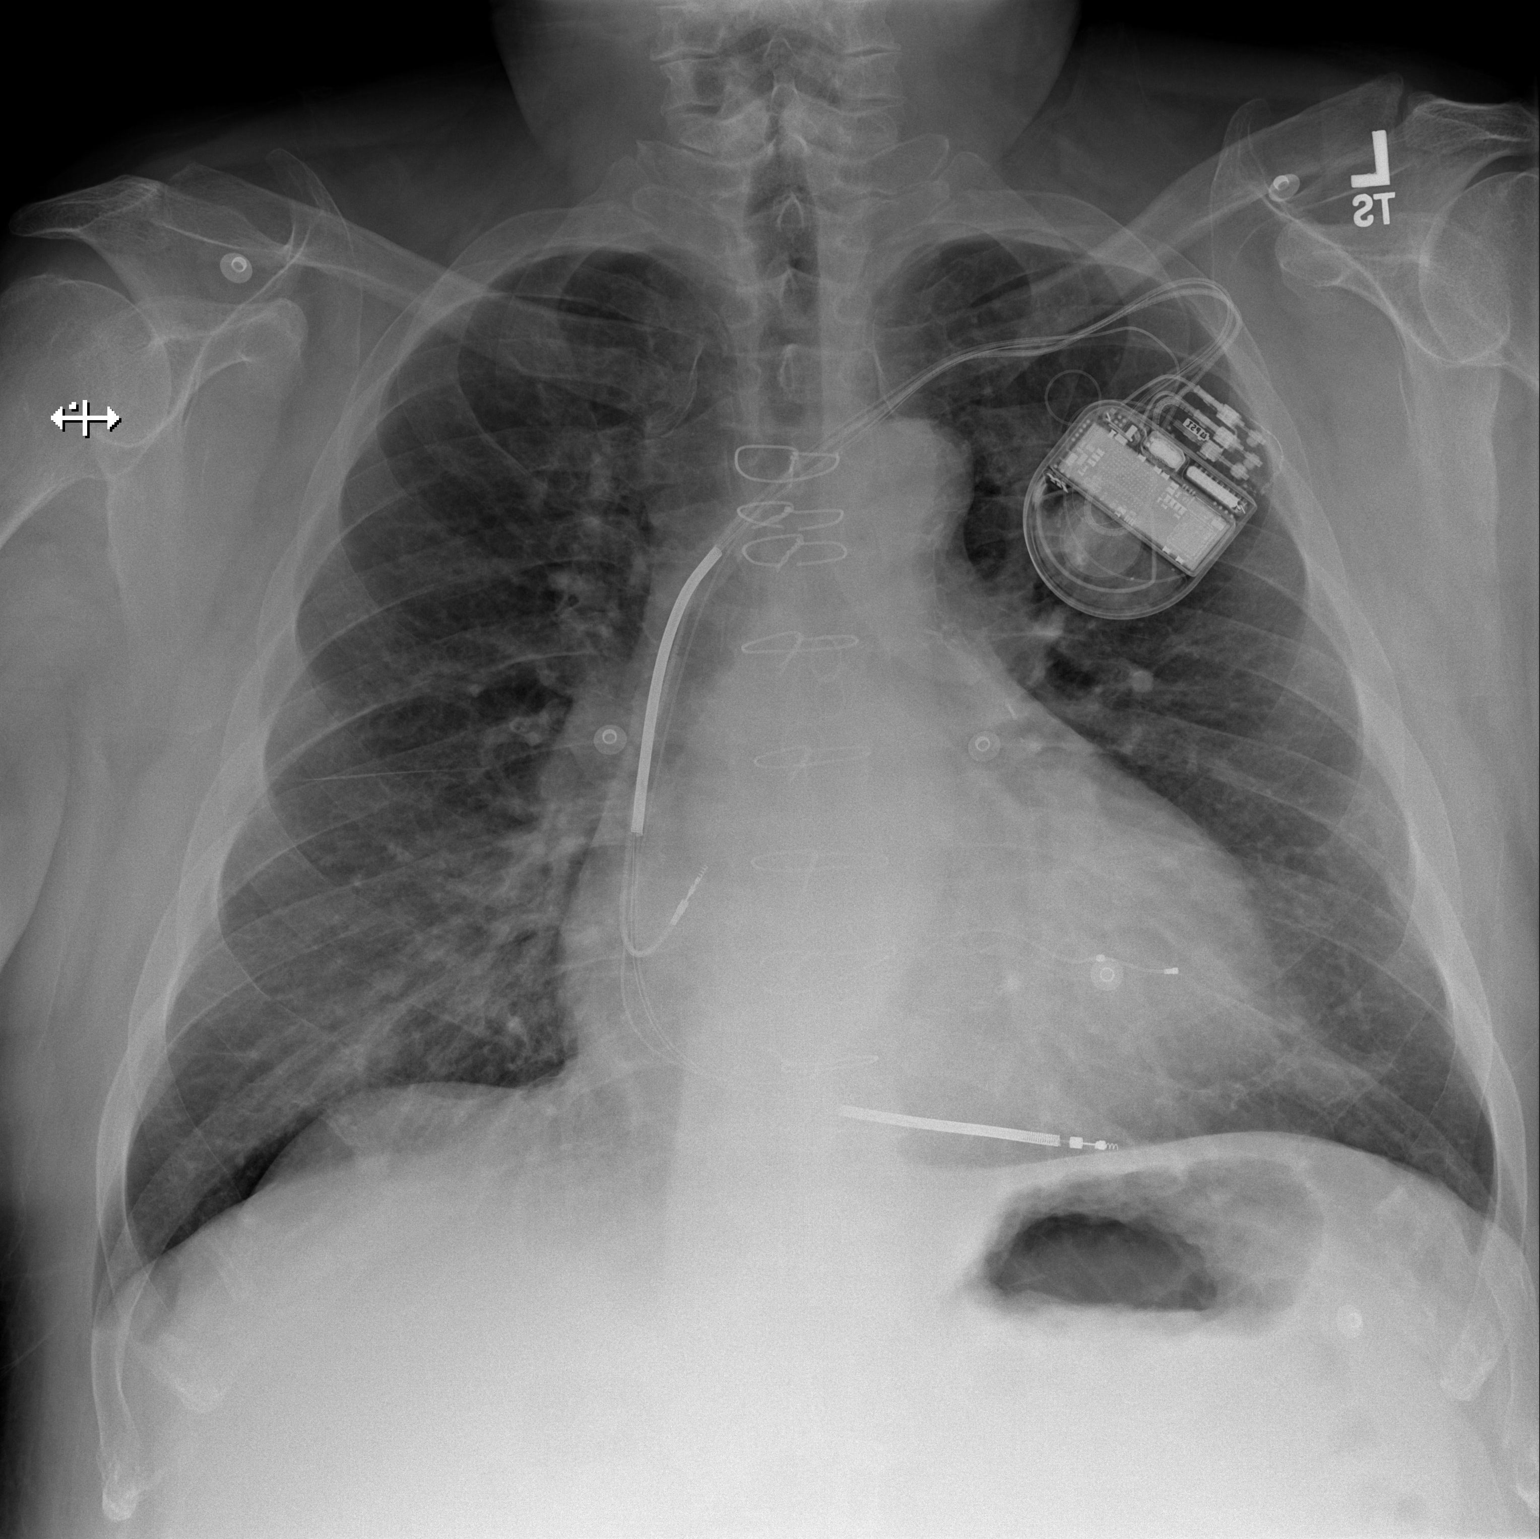
[im 2/2]
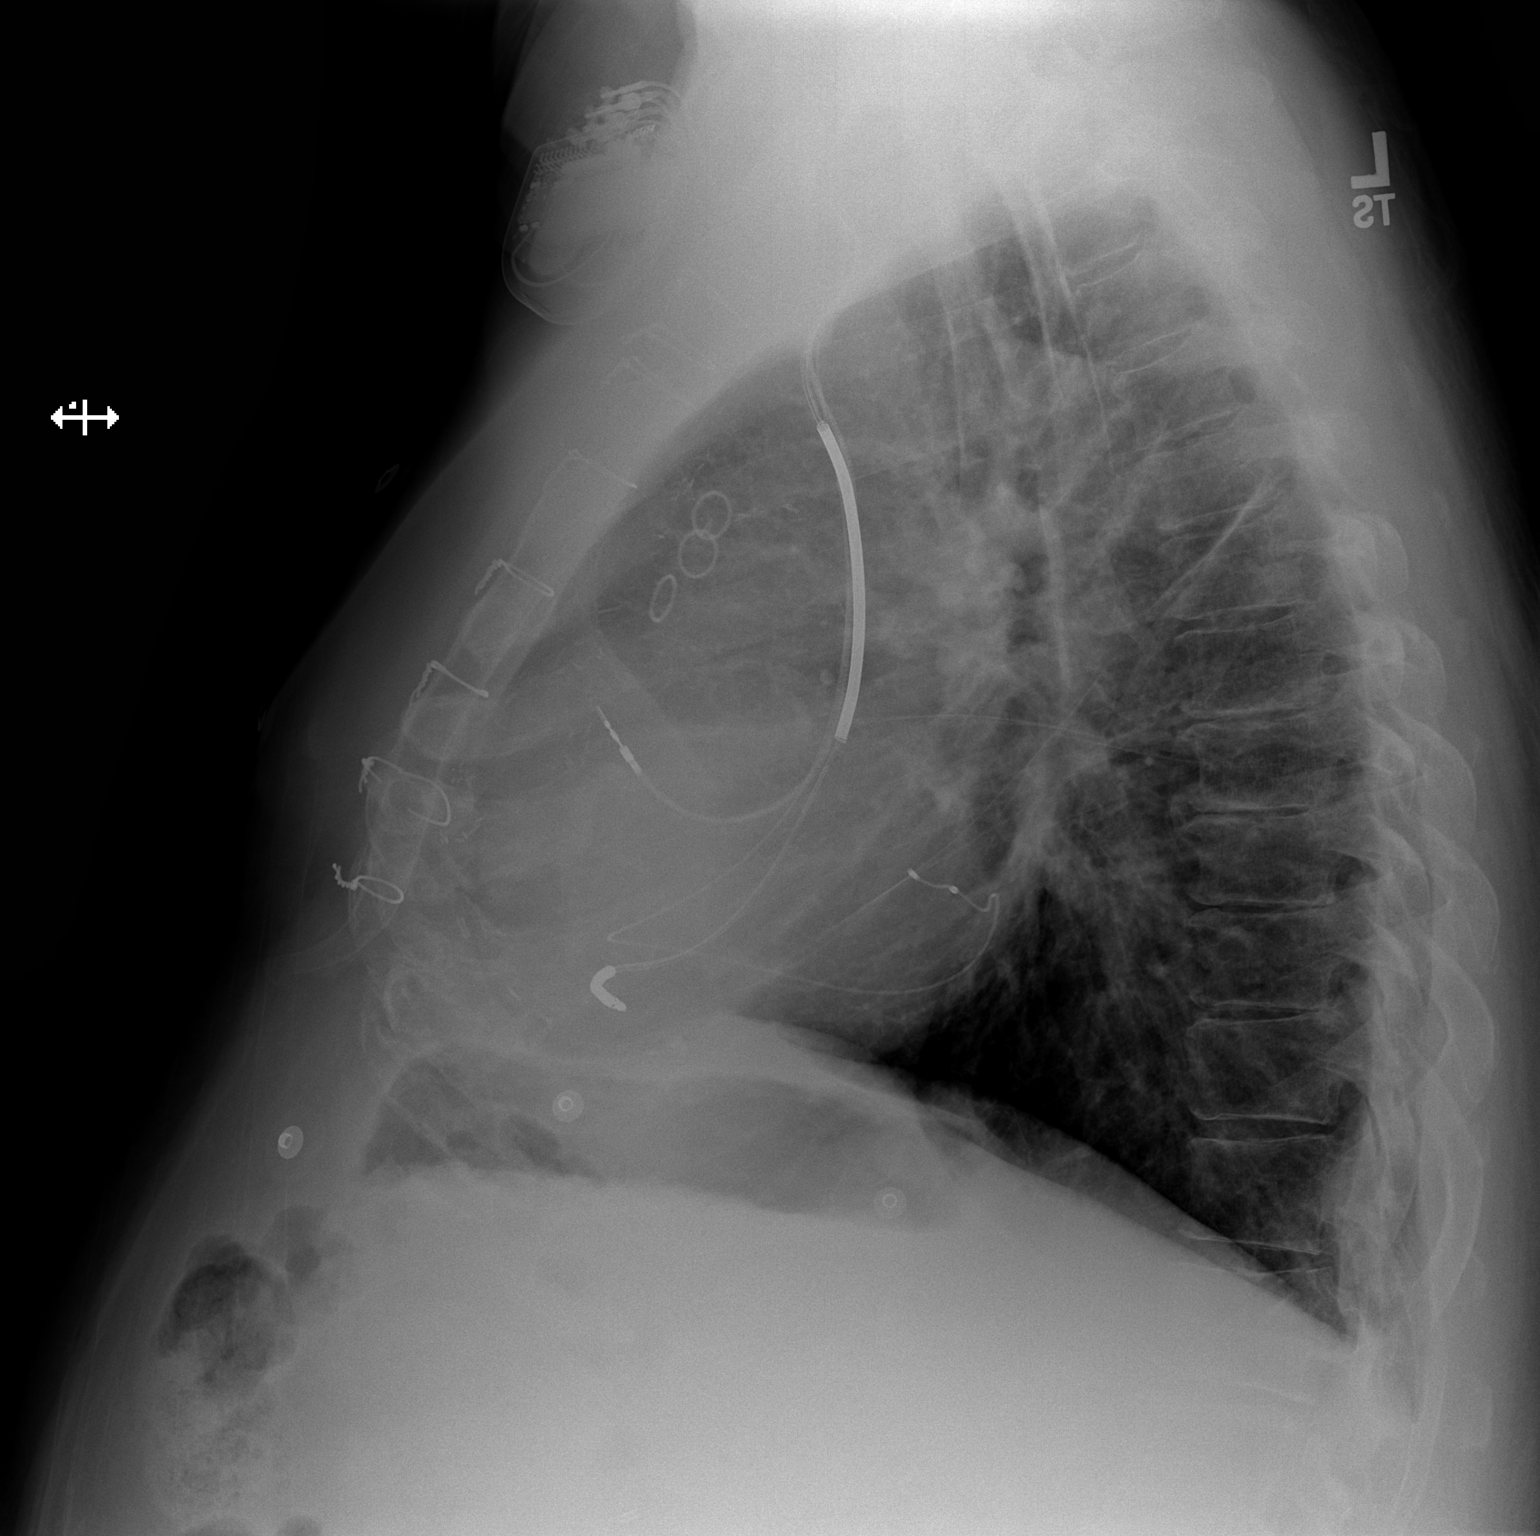

[2 of 2 positions shown; findings below may reference images not displayed]

FINDINGS: The left subclavian AICD leads appear unchanged within the right
atrium, right ventricle and coronary sinus. There is stable
cardiomegaly status post CABG. Chronic vascular congestion is
similar to the prior study. There is no overt pulmonary edema,
confluent airspace opacity or pleural effusion. Mild thoracic spine
degenerative changes are stable.
IMPRESSION: Stable cardiomegaly and chronic vascular congestion. No overt
pulmonary edema.

## 2017-03-02 MED FILL — SILDENAFIL 20 MG TABLET: 20 | 30 days supply | Qty: 180 | Fill #0

## 2017-03-02 NOTE — Telephone Encounter (Signed)
Pt called VAD pager to report increased SOB while attending his counselor meeting at [facility]. States he is unable to walk to [facility] because of SOB.  Wt today 227.4 lbs (pt reports as baseline wt), VAD parameters: Speed 5900, Flow 5.4, PI 3.4, Power 4.7w. Saw pt this am while here for lab visit and was not SOB at that time. Asked if this was sudden onset, he reported "has been going on for three weeks". Would like to be seen in VAD clinic this afternoon. Informed him no clinic appts available today, if sxs are bad enough, he should report to Emergency Room. Informed him I would speak with Dr. Haroldine Laws and call him back if any different/further instructions. Pt says he is going home.  Reviewed above with Dr. Haroldine Laws, lab results reviewed from today. Called pt's cell and home # (no answer), left message on both instructing him to stop Metolazone completely. Also, to hold Torsemide and potassium supplement this evening and all day tomorrow. He needs to re-start regular dose of Torsemide (60 mg twice daily) and potassium (80 meq twice daily) Friday, March 04, 2017. He has clinic f/u 03/08/17 at 10:00 am. Instructed him to call VAD pager if any questions and also to report to ED if sxs continue or do not improve.

## 2017-03-03 ENCOUNTER — Encounter (HOSPITAL_COMMUNITY): Payer: Self-pay | Admitting: Psychology

## 2017-03-03 ENCOUNTER — Other Ambulatory Visit (HOSPITAL_COMMUNITY): Payer: Medicare HMO

## 2017-03-03 NOTE — Progress Notes (Signed)
Ronald Miller. is a 61 y.o. male patient. CD-IOP: Excused Absence. The patient appeared for group today, but seemed to be struggling to get his breath. He admitted he has not felt well over the past week and even stayed at his sister's home last week because he needed some help in caring for him. In the check-in, the patient reported he had discovered that his neighbor at the apartment complex where he lives is also a recovering addict. He admitted he was surprised by the neighbor's disclosure and didn't suspect he was that kind of guy. Partway through the psycho-ed, the patient apologized but reported he couldn't breath because of the fluid build-up that is pressing down on his lungs and he stated he had to leave. He was encouraged to take care of himself and do what he needs to do. Prior to his departure, though, a drug test was collected from him. Today the patient was pale and didn't have the color in his face that he had presented with last week. He will not be billed nor given credit for this session. He has now missed three consecutive group sessions because of illness. We will have to discuss this attendance issue with him and also with the psychologist at [facility]. He is being required to complete the CD-IOP successfully in order to get on the heart transplant list. However, if he cannot get to group this will be a problem. We will see if he returns tomorrow and continue to follow closely in the days ahead.         Brandon Melnick, LCAS

## 2017-03-04 ENCOUNTER — Inpatient Hospital Stay (HOSPITAL_COMMUNITY)
Admission: EM | Admit: 2017-03-04 | Discharge: 2017-03-08 | DRG: 292 | Disposition: A | Discharge status: Home Health | Payer: Medicare HMO | Attending: Internal Medicine | Admitting: Internal Medicine

## 2017-03-04 ENCOUNTER — Telehealth (HOSPITAL_COMMUNITY): Payer: Self-pay | Admitting: Unknown Physician Specialty

## 2017-03-04 ENCOUNTER — Emergency Department (HOSPITAL_COMMUNITY): Payer: Medicare HMO

## 2017-03-04 ENCOUNTER — Encounter (HOSPITAL_COMMUNITY): Payer: Self-pay | Admitting: Emergency Medicine

## 2017-03-04 DIAGNOSIS — N179 Acute kidney failure, unspecified: Secondary | ICD-10-CM | POA: Diagnosis present

## 2017-03-04 DIAGNOSIS — E876 Hypokalemia: Secondary | ICD-10-CM | POA: Diagnosis not present

## 2017-03-04 DIAGNOSIS — R112 Nausea with vomiting, unspecified: Secondary | ICD-10-CM | POA: Diagnosis present

## 2017-03-04 DIAGNOSIS — Z7901 Long term (current) use of anticoagulants: Secondary | ICD-10-CM

## 2017-03-04 DIAGNOSIS — I5023 Acute on chronic systolic (congestive) heart failure: Secondary | ICD-10-CM | POA: Diagnosis present

## 2017-03-04 DIAGNOSIS — Z888 Allergy status to other drugs, medicaments and biological substances status: Secondary | ICD-10-CM

## 2017-03-04 DIAGNOSIS — J449 Chronic obstructive pulmonary disease, unspecified: Secondary | ICD-10-CM | POA: Diagnosis present

## 2017-03-04 DIAGNOSIS — Z8249 Family history of ischemic heart disease and other diseases of the circulatory system: Secondary | ICD-10-CM

## 2017-03-04 DIAGNOSIS — Z7982 Long term (current) use of aspirin: Secondary | ICD-10-CM

## 2017-03-04 DIAGNOSIS — I2510 Atherosclerotic heart disease of native coronary artery without angina pectoris: Secondary | ICD-10-CM | POA: Diagnosis present

## 2017-03-04 DIAGNOSIS — I509 Heart failure, unspecified: Secondary | ICD-10-CM | POA: Diagnosis not present

## 2017-03-04 DIAGNOSIS — Z95810 Presence of automatic (implantable) cardiac defibrillator: Secondary | ICD-10-CM

## 2017-03-04 DIAGNOSIS — Z794 Long term (current) use of insulin: Secondary | ICD-10-CM

## 2017-03-04 DIAGNOSIS — Z9119 Patient's noncompliance with other medical treatment and regimen: Secondary | ICD-10-CM

## 2017-03-04 DIAGNOSIS — E871 Hypo-osmolality and hyponatremia: Secondary | ICD-10-CM | POA: Diagnosis present

## 2017-03-04 DIAGNOSIS — Z87891 Personal history of nicotine dependence: Secondary | ICD-10-CM

## 2017-03-04 DIAGNOSIS — J9811 Atelectasis: Secondary | ICD-10-CM | POA: Diagnosis present

## 2017-03-04 DIAGNOSIS — I480 Paroxysmal atrial fibrillation: Secondary | ICD-10-CM | POA: Diagnosis present

## 2017-03-04 DIAGNOSIS — Z95811 Presence of heart assist device: Secondary | ICD-10-CM | POA: Diagnosis not present

## 2017-03-04 DIAGNOSIS — F419 Anxiety disorder, unspecified: Secondary | ICD-10-CM | POA: Diagnosis present

## 2017-03-04 DIAGNOSIS — F329 Major depressive disorder, single episode, unspecified: Secondary | ICD-10-CM | POA: Diagnosis present

## 2017-03-04 DIAGNOSIS — I82621 Acute embolism and thrombosis of deep veins of right upper extremity: Secondary | ICD-10-CM | POA: Diagnosis present

## 2017-03-04 DIAGNOSIS — Z9049 Acquired absence of other specified parts of digestive tract: Secondary | ICD-10-CM

## 2017-03-04 DIAGNOSIS — F1410 Cocaine abuse, uncomplicated: Secondary | ICD-10-CM | POA: Diagnosis present

## 2017-03-04 DIAGNOSIS — Z885 Allergy status to narcotic agent status: Secondary | ICD-10-CM

## 2017-03-04 DIAGNOSIS — Z79899 Other long term (current) drug therapy: Secondary | ICD-10-CM

## 2017-03-04 DIAGNOSIS — I493 Ventricular premature depolarization: Secondary | ICD-10-CM | POA: Diagnosis present

## 2017-03-04 DIAGNOSIS — Z91048 Other nonmedicinal substance allergy status: Secondary | ICD-10-CM

## 2017-03-04 DIAGNOSIS — Z951 Presence of aortocoronary bypass graft: Secondary | ICD-10-CM

## 2017-03-04 DIAGNOSIS — Z9111 Patient's noncompliance with dietary regimen: Secondary | ICD-10-CM

## 2017-03-04 DIAGNOSIS — I110 Hypertensive heart disease with heart failure: Principal | ICD-10-CM | POA: Diagnosis present

## 2017-03-04 DIAGNOSIS — I5043 Acute on chronic combined systolic (congestive) and diastolic (congestive) heart failure: Secondary | ICD-10-CM

## 2017-03-04 DIAGNOSIS — Z981 Arthrodesis status: Secondary | ICD-10-CM

## 2017-03-04 DIAGNOSIS — I50810 Right heart failure, unspecified: Secondary | ICD-10-CM

## 2017-03-04 DIAGNOSIS — E119 Type 2 diabetes mellitus without complications: Secondary | ICD-10-CM | POA: Diagnosis present

## 2017-03-04 DIAGNOSIS — D509 Iron deficiency anemia, unspecified: Secondary | ICD-10-CM | POA: Diagnosis present

## 2017-03-04 DIAGNOSIS — Z833 Family history of diabetes mellitus: Secondary | ICD-10-CM

## 2017-03-04 LAB — CBC
HEMATOCRIT: 26.9 % — AB (ref 39.0–52.0)
HEMOGLOBIN: 8.2 g/dL — AB (ref 13.0–17.0)
MCH: 23 pg — ABNORMAL LOW (ref 26.0–34.0)
MCHC: 30.5 g/dL (ref 30.0–36.0)
MCV: 75.4 fL — ABNORMAL LOW (ref 78.0–100.0)
PLATELETS: 221 10*3/uL (ref 150–400)
RBC: 3.57 MIL/uL — AB (ref 4.22–5.81)
RDW: 17.9 % — ABNORMAL HIGH (ref 11.5–15.5)
WBC: 7 10*3/uL (ref 4.0–10.5)

## 2017-03-04 LAB — BASIC METABOLIC PANEL
ANION GAP: 10 (ref 5–15)
BUN: 33 mg/dL — ABNORMAL HIGH (ref 6–20)
CO2: 29 mmol/L (ref 22–32)
Calcium: 8.5 mg/dL — ABNORMAL LOW (ref 8.9–10.3)
Chloride: 81 mmol/L — ABNORMAL LOW (ref 101–111)
Creatinine, Ser: 1.54 mg/dL — ABNORMAL HIGH (ref 0.61–1.24)
GFR calc Af Amer: 55 mL/min — ABNORMAL LOW (ref >60)
GFR, EST NON AFRICAN AMERICAN: 47 mL/min — AB (ref >60)
GLUCOSE: 237 mg/dL — AB (ref 65–99)
POTASSIUM: 3.9 mmol/L (ref 3.5–5.1)
Sodium: 120 mmol/L — ABNORMAL LOW (ref 135–145)

## 2017-03-04 LAB — PROTIME-INR
INR: 2.15
Prothrombin Time: 24.3 seconds — ABNORMAL HIGH (ref 11.4–15.2)

## 2017-03-04 LAB — I-STAT TROPONIN, ED: Troponin i, poc: 0.02 ng/mL (ref 0.00–0.08)

## 2017-03-04 MED ORDER — FUROSEMIDE 10 MG/ML IJ SOLN: 60.0000 mg | Freq: Once | INTRAMUSCULAR | Status: DC

## 2017-03-04 NOTE — Telephone Encounter (Signed)
Received page from [place] stating that the pt is up 6 pounds, has abdominal distention and is SOB. Pt was instructed to hold diuretics for 3 doses due to elevated CR. Pt started back on diuretics today. Nurse was instructed that pt should lose weight today with torsemide,, but if his weight does not decrease and is higher tomorrow to please page the VAD pager tomorrow morning.

## 2017-03-04 NOTE — ED Notes (Addendum)
Rapid response notified and are in rout.
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
## 2017-03-04 NOTE — H&P (Signed)
Advanced Heart Failure VAD History and Physical Note   Reason for Admission: Volume overload, nausea and vomiting.    HPI:    Mr. Doenges is a 61 year old male with a past medical history of chronic systolic HF s/p [employer] ICD in 2014, CAD s/p CABG in 2000, HTN, history of cocaine abuse, tobacco abuse, depression, and COPD. He underwent HM 3 placement on 09/09/16. His post operative course was complicated by RV failure, volume overload, L pleural effusion and PAF.   Hospitalized from 11/30/16-12/06/16 with volume overload, DOE and orthopnea. Weight was up 15 pounds, mixed venous sat was 50% consistent  low output. He was diuresed with IV Lasix then transitioned to torsemide 40mg  daily. Had a ramp echo on 12/01/16, and speed increased to 5900 ->6000. The following morning his MV sat was down into 40s with frequent PIevents so speed turned back down to 5900 and diuresis continued.Milrinone started. He was discharged on 0.113mcg of Milrinone. Discharge weight was 224 pounds.   Admitted 01/21/2017 with volume overload. Mixed venous saturation was low so milrinone was increased to 0.375 mcg. Milrinone was stopped with persistently low mixed venous saturation so dobtuamine was started. He discharged on 0.5 mcg dobutamine. Hospital course complicated by RUE swelling. US showed non occlusive DVT in R brachial vein. PICC removed and later tunneled PICC was placed. Discharge weight 233 pounds.   Underwent ramp RHC on 02/11/17 which showed primarily RV failure with normal output.   Over the past few weeks has called the [facility] and VAD pager almost daily complaining of various symptoms. Symptoms felt mainly due to anxiety and depression. 2 days ago walked into clinic without an appt. Looked fine. Labs drawn and showed creatinine up slightly from 1.2-> 1.55. Told to hold torsemide for 2 days and restart today. Said he was bloating. Resumed torsemide and had good urine output but not back to baseline so  presented to ER.   Vitals and VAD parameters stable. Creatinine still 1.55. However sodium down to 120 so admitted.    LVAD INTERROGATION:  HeartMate II LVAD:  Flow 5.5 liters/min, speed 5900, power 4.7, PI 3.1 >20PI events on 01/21/17.    Review of Systems: [y] = yes, [ ]  = no   General: Weight gain [name] ]; Weight loss [ ] ; Anorexia [ ] ; Fatigue [name] ]; Fever [ ] ; Chills [ ] ; Weakness [ ]   Cardiac: Chest pain/pressure [ ] ; Resting SOB [ y]; Exertional SOB [name] ]; Orthopnea [ ] ; Pedal Edema [ y]; Palpitations [ ] ; Syncope [ ] ; Presyncope [ ] ; Paroxysmal nocturnal dyspnea[ ]   Pulmonary: Cough [ ] ; Wheezing[ ] ; Hemoptysis[ ] ; Sputum [ ] ; Snoring [ ]   GI: Nausea/vomiting[ y]; Dysphagia[ ] ; Melena[ ] ; Hematochezia [ ] ; Heartburn[ ] ; Abdominal pain [ ] ; Constipation [ ] ; Diarrhea [ ] ; BRBPR [ ]   GU: Hematuria[ ] ; Dysuria [ ] ; Nocturia[ ]   Vascular: Pain in legs with walking [ ] ; Pain in feet with lying flat [ ] ; Non-healing sores [ ] ; Stroke [ ] ; TIA [ ] ; Slurred speech [ ] ;  Neuro: Headaches[ ] ; Vertigo[ ] ; Seizures[ ] ; Paresthesias[ ] ;Blurred vision [ ] ; Diplopia [ ] ; Vision changes [ ]   Ortho/Skin: Arthritis [ y]; Joint pain [name] ]; Muscle pain [ ] ; Joint swelling [ ] ; Back Pain [ y]; Rash [ ]   Psych: Depression[y ]; Anxiety[y ]  Heme: Bleeding problems [ ] ; Clotting disorders [ ] ; Anemia [ y]  Endocrine: Diabetes [ y]; Thyroid dysfunction[ ]     Home  Medications Prior to Admission medications   Medication Sig Start Date End Date Taking? Authorizing Provider  amiodarone (PACERONE) 200 MG tablet Take 1 tablet (200 mg total) by mouth daily. 01/12/17   Jolaine Artist, MD  aspirin EC 81 MG EC tablet Take 1 tablet (81 mg total) by mouth daily. 12/07/16   Amy D Ninfa Meeker, NP  citalopram (CELEXA) 10 MG tablet Take 1 tablet (10 mg total) by mouth daily. 01/12/17   Jolaine Artist, MD  digoxin (LANOXIN) 0.125 MG tablet Take 1 tablet (125 mcg total) by mouth daily. 01/12/17   Jolaine Artist, MD  docusate  sodium (COLACE) 100 MG capsule Take 2 capsules (200 mg total) by mouth daily. 12/29/16   Jolaine Artist, MD  gabapentin (NEURONTIN) 100 MG capsule Take 2 capsules (200 mg total) by mouth 2 (two) times daily. 01/12/17   Jolaine Artist, MD  insulin aspart (NOVOLOG FLEXPEN) 100 UNIT/ML FlexPen Inject 10 Units into the skin 3 (three) times daily with meals. 12/16/16   Amy D Ninfa Meeker, NP  Insulin Glargine (LANTUS SOLOSTAR) 100 UNIT/ML Solostar Pen Inject 20 Units into the skin daily at 10 pm.    Historical Provider, MD  losartan (COZAAR) 25 MG tablet Take 1 tablet (25 mg total) by mouth daily. 12/07/16   Amy D Clegg, NP  magnesium oxide (MAG-OX) 400 (241.3 Mg) MG tablet Take 1 tablet (400 mg total) by mouth daily. 12/07/16   Amy D Ninfa Meeker, NP  metolazone (ZAROXOLYN) 2.5 MG tablet Take 2 tablets (5 mg total) by mouth as directed. By [facility] .... Take every Monday, Wednesday, and friday 01/20/17   Jolaine Artist, MD  milrinone [facility]) 20 MG/100 ML SOLN infusion Inject 25.175 mcg/min into the vein continuous. Per [facility] pharmacy 12/22/16   Jolaine Artist, MD  pantoprazole (PROTONIX) 40 MG tablet Take 1 tablet (40 mg total) by mouth daily. 09/25/16   Isaiah Serge, NP  potassium chloride SA (K-DUR,KLOR-CON) 20 MEQ tablet Take 20 mEq by mouth 2 (two) times daily. Take additional 20 mEq tablet on metolazone days    Historical Provider, MD  senna-docusate (SENOKOT-S) 8.6-50 MG tablet Take 2 tablets by mouth daily. 01/20/17   Jolaine Artist, MD  sildenafil (REVATIO) 20 MG tablet Take 1 tablet (20 mg total) by mouth 3 (three) times daily. 12/31/16   Jolaine Artist, MD  spironolactone (ALDACTONE) 25 MG tablet Take 1 tablet (25 mg total) by mouth daily. 12/07/16   Amy D Ninfa Meeker, NP  torsemide (DEMADEX) 20 MG tablet Take 60 mgs in the morning and 40mg s in the evening. Patient taking differently: Take 60 mg by mouth 2 (two) times daily. Take 60 mgs in the morning and 40mg s in the evening. 01/11/17    Larey Dresser, MD  warfarin (COUMADIN) 5 MG tablet Take 2-3 tablets (10-15 mg total) by mouth daily. As directed based on blood checks 01/06/17   Jolaine Artist, MD    Past Medical History: Past Medical History:  Diagnosis Date  . AICD (automatic cardioverter/defibrillator) present   . ASCVD (arteriosclerotic cardiovascular disease)   . Chronic systolic CHF (congestive heart failure) ([place])   . COPD (chronic obstructive pulmonary disease) ([place])   . Coronary artery disease   . Depression   . Diabetes mellitus   . History of cocaine abuse   . Hypertension   . Presence of permanent cardiac pacemaker   . Shortness of breath dyspnea   . Suicidal ideation   .  Tobacco abuse     Past Surgical History: Past Surgical History:  Procedure Laterality Date  . CARDIAC CATHETERIZATION N/A 02/12/2016   Procedure: Right/Left Heart Cath and Coronary/Graft Angiography;  Surgeon: Jolaine Artist, MD;  Location: [place] [facility];  Service: Cardiovascular;  Laterality: N/A;  . CARDIAC CATHETERIZATION N/A 03/22/2016   Procedure: Right Heart Cath;  Surgeon: Jolaine Artist, MD;  Location: [place] [facility];  Service: Cardiovascular;  Laterality: N/A;  . CARDIAC CATHETERIZATION N/A 08/25/2016   Procedure: Right Heart Cath;  Surgeon: Jolaine Artist, MD;  Location: [place] [facility];  Service: Cardiovascular;  Laterality: N/A;  . CARDIAC CATHETERIZATION N/A 09/03/2016   Procedure: Right Heart Cath;  Surgeon: Jolaine Artist, MD;  Location: [place] [facility];  Service: Cardiovascular;  Laterality: N/A;  . CARDIAC DEFIBRILLATOR PLACEMENT    . CARDIAC DEFIBRILLATOR PLACEMENT    . CHOLECYSTECTOMY N/A 08/30/2015   Procedure: LAPAROSCOPIC CHOLECYSTECTOMY;  Surgeon: Hubbard Robinson, MD;  Location: [name];  Service: General;  Laterality: N/A;  . COLONOSCOPY WITH PROPOFOL N/A 08/18/2016   Procedure: COLONOSCOPY WITH PROPOFOL;  Surgeon: Jerene Bears, MD;  Location: [place];  Service:  Gastroenterology;  Laterality: N/A;  . CORONARY ARTERY BYPASS GRAFT  2001  . INSERT / REPLACE / REMOVE PACEMAKER    . INSERTION OF IMPLANTABLE LEFT VENTRICULAR ASSIST DEVICE N/A 09/09/2016   Procedure: INSERTION OF IMPLANTABLE LEFT VENTRICULAR ASSIST DEVICE;  Surgeon: [name], MD;  Location: [place];  Service: Open Heart Surgery;  Laterality: N/A;  HeartMate 3 CIRC ARREST  NITRIC OXIDE  . IR FLUORO GUIDE CV LINE RIGHT  01/28/2017  . IR GENERIC HISTORICAL  08/03/2016   IR FLUORO GUIDE CV LINE RIGHT 08/03/2016 [name], MD [facility]  . IR GENERIC HISTORICAL  12/14/2016   IR FLUORO GUIDE CV LINE RIGHT 12/14/2016 [name], MD [facility]  . IR US GUIDE VASC ACCESS RIGHT  01/28/2017  . LUMBAR DISC SURGERY  02/1999   Discectomy and fusion  . RIGHT HEART CATH N/A 02/11/2017   Procedure: Right Heart Cath;  Surgeon: Jolaine Artist, MD;  Location: [place] [facility];  Service: Cardiovascular;  Laterality: N/A;  . TEE WITHOUT CARDIOVERSION N/A 09/09/2016   Procedure: TRANSESOPHAGEAL ECHOCARDIOGRAM (TEE);  Surgeon: [name], MD;  Location: [place];  Service: Open Heart Surgery;  Laterality: N/A;  . VASECTOMY     Subsequent reversal    Family History: Family History  Problem Relation Age of Onset  . CAD Father   . Diabetes Father   . Heart failure Father     Social History: Social History   Social History  . Marital status: Divorced    Spouse name: N/A  . Number of children: 1  . Years of education: 41   Occupational History  . Retired   . Truck driver    Social History Main Topics  . Smoking status: Former Smoker    Packs/day: 1.50    Years: 23.00    Types: Cigarettes    Start date: 10/24/1992    Quit date: 07/01/2016  . Smokeless tobacco: Never Used  . Alcohol use No  . Drug use: Yes    Frequency: 0.1 times per week    Types: Cocaine     Comment: 07/01/2016 last use relapse after 8 yrs  . Sexual activity: Not Currently   Other Topics Concern    . None   Social History Narrative   Divorced with  one child   No regular exercise    Allergies:  Allergies  Allergen Reactions  . Codeine Nausea And Vomiting    In high doses  . Trazodone And Nefazodone Other (See Comments)    Dizziness with subsequent fall   . Lipitor [Atorvastatin] Nausea Only    Nausea with high doses, tolerates 20mg  dose (08/22/16)  . Tape Itching    Paper tape is ok    Objective:    Vital Signs:   Temp:  [99.2 F (37.3 C)] 99.2 F (37.3 C) (06/01 2046) Pulse Rate:  [80] 80 (06/01 2046) Resp:  [22] 22 (06/01 2046) SpO2:  [96 %] 96 % (06/01 2046) Weight:  [233 lb (105.7 kg)] 233 lb (105.7 kg) (06/01 2044)   Filed Weights   03/04/17 2044  Weight: 233 lb (105.7 kg)    Mean arterial Pressure: 82  Physical Exam: General:  Lying in bed. AND HEENT: Normal anicteric Neck: supple. Thick neck hard to see JVP  . Carotids 2+ bilat; no bruits. No lymphadenopathy or thyromegaly appreciated. Cor: Mechanical heart sounds with LVAD hum present. Normal HM-II sounds Lungs: Clear. No wheeze Abdomen: obese. soft, nontender, + distended. No hepatosplenomegaly. No bruits or masses. Good bowel sounds. Driveline: C/D/I; securement device intact and driveline incorporated No signs of driveline infection Extremities: no cyanosis, clubbing, rash, edema Neuro: alert & orientedx3, cranial nerves grossly intact. moves all 4 extremities w/o difficulty. Affect pleasant    Labs: Basic Metabolic Panel:  Recent Labs Lab 03/02/17 1105 03/04/17 2055  NA 123* 120*  K 3.6 3.9  CL 79* 81*  CO2 34* 29  GLUCOSE 243* 237*  BUN 42* 33*  CREATININE 1.55* 1.54*  CALCIUM 8.8* 8.5*    Liver Function Tests: No results for input(s): AST, ALT, ALKPHOS, BILITOT, PROT, ALBUMIN in the last 168 hours.  CBC:  Recent Labs Lab 03/02/17 1105 03/04/17 2055  WBC 7.2 7.0  HGB 8.7* 8.2*  HCT 28.3* 26.9*  MCV 75.9* 75.4*  PLT 218 221     BNP: BNP (last 3 results)  Recent  Labs  10/13/16 1207 11/30/16 1158 01/21/17 1230  BNP 321.5* 565.5* 649.9*    Coagulation Studies:  Recent Labs  03/02/17 1105 03/04/17 2055  LABPROT 29.8* 24.3*  INR 2.77 2.15   Assessment and Plan/Discussion:    1. 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 R>>L  - Echo 08/2016 with EF of 15%. s/p HM3 LVAD 09/2016 - Post VAD course c/b RV failure. Now on dobutamine - Volume status hard to assess acurately but appears elevated. Weight up slightly from baseline. Abdomen seems more distended than usual but creatinine up. Will check co-ox and CVP - Continue dobutamine for RV support - Continue sildenafil. - VAD parameters stable.  - He was seen at [facility] - Dr. Hoyt Koch for transplant this week. Doubt he will be transplant candidate due mainly to social issues - LDH 238 - INR 2.15. Continue coumadin 2. Hyponatremia - suspect due to diuretics and heavy O2 intake - Will give one dose tolvapatan - Fluid restrict 3. CAD s/p CABG - No ischemic symoptoms - On ASA 4. Nausea/vomiting - suspect due to RV failure and gut edema.  5. DM  - suboptimal control. Check HgBa1c - Sliding scale ordered.  6. Iron deficiency anemia - Check iron stores. Will likely need Feraheme 7. AKI - hold losartan for now. Should improve with diuresis  I reviewed the LVAD parameters from today, and compared the results to the patient's prior recorded data.  No programming changes were made.  The LVAD is functioning within specified parameters.  The patient performs LVAD self-test daily.  LVAD interrogation was negative for any significant power changes, alarms or PI events/speed drops.  LVAD equipment check completed and is in good working order.  Back-up equipment present.   LVAD education done on emergency procedures and precautions and reviewed exit site care.  Length of Stay: 0  Glori Bickers, MD 03/04/2017, 10:10 PM  VAD Team Pager [phone] (7am - 7am) +++VAD ISSUES ONLY+++ Advanced Heart Failure  Team Pager [phone] (M-F; [place])  Please contact [facility] for night-coverage after hours (4p -7a ) and weekends on [email or web address] for all non- LVAD Issues

## 2017-03-04 NOTE — ED Triage Notes (Signed)
Pt also has continuous dobutamine running through PICC line.

## 2017-03-04 NOTE — H&P (Signed)
Advanced Heart Failure VAD History and Physical Note   Reason for Admission: Volume overload, nausea and vomiting.    HPI:    Mr. Ronald Miller is a 61 year old male with a past medical history of chronic systolic HF s/p [employer] ICD in 2014, CAD s/p CABG in 2000, HTN, history of cocaine abuse, tobacco abuse, depression, and COPD. He underwent HM 3 placement on 09/09/16. His post operative course was complicated by RV failure, volume overload, L pleural effusion and PAF.   Hospitalized from 11/30/16-12/06/16 with volume overload, DOE and orthopnea. Weight was up 15 pounds, mixed venous sat was 50% consistent  low output. He was diuresed with IV Lasix then transitioned to torsemide 40mg  daily. Had a ramp echo on 12/01/16, and speed increased to 5900 ->6000. The following morning his MV sat was down into 40s with frequent PIevents so speed turned back down to 5900 and diuresis continued.Milrinone started. He was discharged on 0.14mcg of Milrinone. Discharge weight was 224 pounds.   Admitted 01/21/2017 with volume overload. Mixed venous saturation was low so milrinone was increased to 0.375 mcg. Milrinone was stopped with persistently low mixed venous saturation so dobtuamine was started. He discharged on 0.5 mcg dobutamine. Hospital course complicated by RUE swelling. US showed non occlusive DVT in R brachial vein. PICC removed and later tunneled PICC was placed. Discharge weight 233 pounds.   Underwent ramp RHC on 02/11/17 which showed primarily RV failure with normal output.   Over the past few weeks has called the HF Clinic and VAD pager almost daily complaining of various symptoms. Symptoms felt mainly due to anxiety and depression. 2 days ago walked into clinic without an appt. Looked fine. Labs drawn and showed creatinine up slightly from 1.2-> 1.55. Told to hold torsemide for 2 days and restart today. He reports gaining almost 7 pounds in one day. Drinking lots of water and eating ice. Said he was  bloating. Resumed torsemide and had good urine output but felt weak and developed n/v so presented to ER.   In ER, vitals and VAD parameters stable. Creatinine still 1.55. However sodium down to 120. Good response to IV lasix in ER. CXR clear     LVAD INTERROGATION:  HeartMate II LVAD:  Flow 5.54 liters/min, speed 5900, power 4.7, PI 3.3    Review of Systems: [y] = yes, [ ]  = no   General: Weight gain [name] ]; Weight loss [ ] ; Anorexia [ ] ; Fatigue [name] ]; Fever [ ] ; Chills [ ] ; Weakness [ ]   Cardiac: Chest pain/pressure [ ] ; Resting SOB [ y]; Exertional SOB [name] ]; Orthopnea [ ] ; Pedal Edema [ y]; Palpitations [ ] ; Syncope [ ] ; Presyncope [ ] ; Paroxysmal nocturnal dyspnea[ ]   Pulmonary: Cough [ ] ; Wheezing[ ] ; Hemoptysis[ ] ; Sputum [ ] ; Snoring [ ]   GI: Nausea/vomiting[ y]; Dysphagia[ ] ; Melena[ ] ; Hematochezia [ ] ; Heartburn[ ] ; Abdominal pain [ ] ; Constipation [ ] ; Diarrhea [ ] ; BRBPR [ ]   GU: Hematuria[ ] ; Dysuria [ ] ; Nocturia[ ]   Vascular: Pain in legs with walking [ ] ; Pain in feet with lying flat [ ] ; Non-healing sores [ ] ; Stroke [ ] ; TIA [ ] ; Slurred speech [ ] ;  Neuro: Headaches[ ] ; Vertigo[ ] ; Seizures[ ] ; Paresthesias[ ] ;Blurred vision [ ] ; Diplopia [ ] ; Vision changes [ ]   Ortho/Skin: Arthritis [ y]; Joint pain [name] ]; Muscle pain [ ] ; Joint swelling [ ] ; Back Pain [ y]; Rash [ ]   Psych: Depression[y ]; Anxiety[y ]  Heme:  Bleeding problems [ ] ; Clotting disorders [ ] ; Anemia [ y]  Endocrine: Diabetes [ y]; Thyroid dysfunction[ ]     Home Medications Prior to Admission medications   Medication Sig Start Date End Date Taking? Authorizing Provider  amiodarone (PACERONE) 200 MG tablet Take 1 tablet (200 mg total) by mouth daily. 01/12/17   Jolaine Artist, MD  aspirin EC 81 MG EC tablet Take 1 tablet (81 mg total) by mouth daily. 12/07/16   Amy D Ninfa Meeker, NP  citalopram (CELEXA) 10 MG tablet Take 1 tablet (10 mg total) by mouth daily. 01/12/17   Jolaine Artist, MD  digoxin (LANOXIN)  0.125 MG tablet Take 1 tablet (125 mcg total) by mouth daily. 01/12/17   Jolaine Artist, MD  docusate sodium (COLACE) 100 MG capsule Take 2 capsules (200 mg total) by mouth daily. 12/29/16   Jolaine Artist, MD  gabapentin (NEURONTIN) 100 MG capsule Take 2 capsules (200 mg total) by mouth 2 (two) times daily. 01/12/17   Jolaine Artist, MD  insulin aspart (NOVOLOG FLEXPEN) 100 UNIT/ML FlexPen Inject 10 Units into the skin 3 (three) times daily with meals. 12/16/16   Amy D Ninfa Meeker, NP  Insulin Glargine (LANTUS SOLOSTAR) 100 UNIT/ML Solostar Pen Inject 20 Units into the skin daily at 10 pm.    Historical Provider, MD  losartan (COZAAR) 25 MG tablet Take 1 tablet (25 mg total) by mouth daily. 12/07/16   Amy D Clegg, NP  magnesium oxide (MAG-OX) 400 (241.3 Mg) MG tablet Take 1 tablet (400 mg total) by mouth daily. 12/07/16   Amy D Ninfa Meeker, NP  metolazone (ZAROXOLYN) 2.5 MG tablet Take 2 tablets (5 mg total) by mouth as directed. By [facility] Failure Clinic .... Take every Monday, Wednesday, and friday 01/20/17   Jolaine Artist, MD  milrinone [facility]) 20 MG/100 ML SOLN infusion Inject 25.175 mcg/min into the vein continuous. Per [facility] pharmacy 12/22/16   Jolaine Artist, MD  pantoprazole (PROTONIX) 40 MG tablet Take 1 tablet (40 mg total) by mouth daily. 09/25/16   Isaiah Serge, NP  potassium chloride SA (K-DUR,KLOR-CON) 20 MEQ tablet Take 20 mEq by mouth 2 (two) times daily. Take additional 20 mEq tablet on metolazone days    Historical Provider, MD  senna-docusate (SENOKOT-S) 8.6-50 MG tablet Take 2 tablets by mouth daily. 01/20/17   Jolaine Artist, MD  sildenafil (REVATIO) 20 MG tablet Take 1 tablet (20 mg total) by mouth 3 (three) times daily. 12/31/16   Jolaine Artist, MD  spironolactone (ALDACTONE) 25 MG tablet Take 1 tablet (25 mg total) by mouth daily. 12/07/16   Amy D Ninfa Meeker, NP  torsemide (DEMADEX) 20 MG tablet Take 60 mgs in the morning and 40mg s in the evening. Patient taking differently:  Take 60 mg by mouth 2 (two) times daily. Take 60 mgs in the morning and 40mg s in the evening. 01/11/17   Larey Dresser, MD  warfarin (COUMADIN) 5 MG tablet Take 2-3 tablets (10-15 mg total) by mouth daily. As directed based on blood checks 01/06/17   Jolaine Artist, MD    Past Medical History: Past Medical History:  Diagnosis Date  . AICD (automatic cardioverter/defibrillator) present   . ASCVD (arteriosclerotic cardiovascular disease)   . Chronic systolic CHF (congestive heart failure) ([place])   . COPD (chronic obstructive pulmonary disease) ([place])   . Coronary artery disease   . Depression   . Diabetes mellitus   . History of cocaine abuse   . Hypertension   .  Presence of permanent cardiac pacemaker   . Shortness of breath dyspnea   . Suicidal ideation   . Tobacco abuse     Past Surgical History: Past Surgical History:  Procedure Laterality Date  . CARDIAC CATHETERIZATION N/A 02/12/2016   Procedure: Right/Left Heart Cath and Coronary/Graft Angiography;  Surgeon: Jolaine Artist, MD;  Location: [place] CV [facility];  Service: Cardiovascular;  Laterality: N/A;  . CARDIAC CATHETERIZATION N/A 03/22/2016   Procedure: Right Heart Cath;  Surgeon: Jolaine Artist, MD;  Location: [place] [facility];  Service: Cardiovascular;  Laterality: N/A;  . CARDIAC CATHETERIZATION N/A 08/25/2016   Procedure: Right Heart Cath;  Surgeon: Jolaine Artist, MD;  Location: [place] [facility];  Service: Cardiovascular;  Laterality: N/A;  . CARDIAC CATHETERIZATION N/A 09/03/2016   Procedure: Right Heart Cath;  Surgeon: Jolaine Artist, MD;  Location: [place] [facility];  Service: Cardiovascular;  Laterality: N/A;  . CARDIAC DEFIBRILLATOR PLACEMENT    . CARDIAC DEFIBRILLATOR PLACEMENT    . CHOLECYSTECTOMY N/A 08/30/2015   Procedure: LAPAROSCOPIC CHOLECYSTECTOMY;  Surgeon: Hubbard Robinson, MD;  Location: [name];  Service: General;  Laterality: N/A;  . COLONOSCOPY WITH PROPOFOL N/A 08/18/2016    Procedure: COLONOSCOPY WITH PROPOFOL;  Surgeon: Jerene Bears, MD;  Location: [place];  Service: Gastroenterology;  Laterality: N/A;  . CORONARY ARTERY BYPASS GRAFT  2001  . INSERT / REPLACE / REMOVE PACEMAKER    . INSERTION OF IMPLANTABLE LEFT VENTRICULAR ASSIST DEVICE N/A 09/09/2016   Procedure: INSERTION OF IMPLANTABLE LEFT VENTRICULAR ASSIST DEVICE;  Surgeon: [name], MD;  Location: [place];  Service: Open Heart Surgery;  Laterality: N/A;  HeartMate 3 CIRC ARREST  NITRIC OXIDE  . IR FLUORO GUIDE CV LINE RIGHT  01/28/2017  . IR GENERIC HISTORICAL  08/03/2016   IR FLUORO GUIDE CV LINE RIGHT 08/03/2016 [name], MD [facility]  . IR GENERIC HISTORICAL  12/14/2016   IR FLUORO GUIDE CV LINE RIGHT 12/14/2016 [name], MD [facility]  . IR US GUIDE VASC ACCESS RIGHT  01/28/2017  . LUMBAR DISC SURGERY  02/1999   Discectomy and fusion  . RIGHT HEART CATH N/A 02/11/2017   Procedure: Right Heart Cath;  Surgeon: Jolaine Artist, MD;  Location: [place] CV [facility];  Service: Cardiovascular;  Laterality: N/A;  . TEE WITHOUT CARDIOVERSION N/A 09/09/2016   Procedure: TRANSESOPHAGEAL ECHOCARDIOGRAM (TEE);  Surgeon: [name], MD;  Location: [place];  Service: Open Heart Surgery;  Laterality: N/A;  . VASECTOMY     Subsequent reversal    Family History: Family History  Problem Relation Age of Onset  . CAD Father   . Diabetes Father   . Heart failure Father     Social History: Social History   Social History  . Marital status: Divorced    Spouse name: N/A  . Number of children: 1  . Years of education: 55   Occupational History  . Retired   . Truck driver    Social History Main Topics  . Smoking status: Former Smoker    Packs/day: 1.50    Years: 23.00    Types: Cigarettes    Start date: 10/24/1992    Quit date: 07/01/2016  . Smokeless tobacco: Never Used  . Alcohol use No  . Drug use: Yes    Frequency: 0.1 times per week    Types: Cocaine     Comment:  07/01/2016 last use relapse after 8 yrs  . Sexual activity:  Not Currently   Other Topics Concern  . None   Social History Narrative   Divorced with one child   No regular exercise    Allergies:  Allergies  Allergen Reactions  . Codeine Nausea And Vomiting    In high doses  . Trazodone And Nefazodone Other (See Comments)    Dizziness with subsequent fall   . Lipitor [Atorvastatin] Nausea Only    Nausea with high doses, tolerates 20mg  dose (08/22/16)  . Tape Itching    Paper tape is ok    Objective:    Vital Signs:   Temp:  [99.2 F (37.3 C)] 99.2 F (37.3 C) (06/01 2046) Pulse Rate:  [80] 80 (06/01 2046) Resp:  [22] 22 (06/01 2046) SpO2:  [96 %] 96 % (06/01 2046) Weight:  [105.7 kg (233 lb)] 105.7 kg (233 lb) (06/01 2044)   Filed Weights   03/04/17 2044  Weight: 105.7 kg (233 lb)    Mean arterial Pressure: 80  Physical Exam: General:  Sitting up in bed. Feeling nasueated HEENT: Normal anicteric Neck: supple. JVP to jaw . Carotids 2+ bilat; no bruits. No lymphadenopathy or thyromegaly appreciated. Cor: Mechanical heart sounds with LVAD hum present. Normal HM-III sounds Lungs: Clear. No wheeze Abdomen: obese. soft, nontender, + distended. No hepatosplenomegaly. No bruits or masses. Good bowel sounds. Driveline: C/D/I; securement device intact and driveline incorporated No signs of driveline infection Extremities: no cyanosis, clubbing, rash, trace-1+ edema Neuro: alert & orientedx3, cranial nerves grossly intact. moves all 4 extremities w/o difficulty. Affect pleasant    Labs: Basic Metabolic Panel:  Recent Labs Lab 03/02/17 1105 03/04/17 2055  NA 123* 120*  K 3.6 3.9  CL 79* 81*  CO2 34* 29  GLUCOSE 243* 237*  BUN 42* 33*  CREATININE 1.55* 1.54*  CALCIUM 8.8* 8.5*    Liver Function Tests: No results for input(s): AST, ALT, ALKPHOS, BILITOT, PROT, ALBUMIN in the last 168 hours.  CBC:  Recent Labs Lab 03/02/17 1105 03/04/17 2055  WBC 7.2 7.0   HGB 8.7* 8.2*  HCT 28.3* 26.9*  MCV 75.9* 75.4*  PLT 218 221     BNP: BNP (last 3 results)  Recent Labs  10/13/16 1207 11/30/16 1158 01/21/17 1230  BNP 321.5* 565.5* 649.9*    Coagulation Studies:  Recent Labs  03/02/17 1105 03/04/17 2055  LABPROT 29.8* 24.3*  INR 2.77 2.15   Assessment and Plan/Discussion:    1. 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 R>>L  - Echo 08/2016 with EF of 15%. s/p HM3 LVAD 09/2016 - Post VAD course c/b RV failure. Now on dobutamine - Volume status appears elevated due to RV failure. Weight up slightly from baseline. Abdomen seems more distended than usual but creatinine up. Will check co-ox and CVP - Continue dobutamine for RV support - Continue sildenafil. - VAD parameters stable.  - He was seen at [facility] - Dr. Hoyt Koch for transplant recently. Doubt he will be transplant candidate due mainly to social issues - LDH 238 - INR 2.15. Continue coumadin 2. Hyponatremia - suspect due to diuretics and heavy water/ice intake - Will give one dose tolvapatan - Fluid restrict 3. CAD s/p CABG - No ischemic symptoms - On ASA 4. Nausea/vomiting - suspect due to RV failure and gut edema.  5. DM  - suboptimal control. Check HgBa1c - Sliding scale ordered.  6. Iron deficiency anemia - Check iron stores. Will likely need Feraheme  I reviewed the LVAD parameters from today, and compared the results to  the patient's prior recorded data.  No programming changes were made.  The LVAD is functioning within specified parameters.  The patient performs LVAD self-test daily.  LVAD interrogation was negative for any significant power changes, alarms or PI events/speed drops.  LVAD equipment check completed and is in good working order.  Back-up equipment present.   LVAD education done on emergency procedures and precautions and reviewed exit site care.  Length of Stay: 0  Glori Bickers, MD 03/04/2017, 11:58 PM  VAD Team Pager [phone] (7am - 7am)  +++VAD ISSUES ONLY+++ Advanced Heart Failure Team Pager [phone] (M-F; 7a - 4p)  Please contact [facility] for night-coverage after hours (4p -7a ) and weekends on [email or web address] for all non- LVAD Issues

## 2017-03-04 NOTE — ED Triage Notes (Signed)
LVAD pt, reports SOB, central chest pressure X2-3 days. Pt also C/O distended abd. Dyspnea at rest and with exertion. Labored breathing. Pt also has non productive cough. Charge RN aware of need for room.

## 2017-03-04 NOTE — Progress Notes (Signed)
Ronald Miller. is a 62 y.o. male patient. CD-IOP: Absent. The patient did not appear for group nor did he phone to explain his absence. He had to leave group yesterday due to SOB. Will wait to hear from him.         Brandon Melnick, LCAS

## 2017-03-05 DIAGNOSIS — E876 Hypokalemia: Secondary | ICD-10-CM | POA: Diagnosis not present

## 2017-03-05 DIAGNOSIS — Z9049 Acquired absence of other specified parts of digestive tract: Secondary | ICD-10-CM | POA: Diagnosis not present

## 2017-03-05 DIAGNOSIS — F419 Anxiety disorder, unspecified: Secondary | ICD-10-CM | POA: Diagnosis present

## 2017-03-05 DIAGNOSIS — F329 Major depressive disorder, single episode, unspecified: Secondary | ICD-10-CM | POA: Diagnosis present

## 2017-03-05 DIAGNOSIS — R0602 Shortness of breath: Secondary | ICD-10-CM | POA: Diagnosis not present

## 2017-03-05 DIAGNOSIS — Z95810 Presence of automatic (implantable) cardiac defibrillator: Secondary | ICD-10-CM | POA: Diagnosis not present

## 2017-03-05 DIAGNOSIS — Z8249 Family history of ischemic heart disease and other diseases of the circulatory system: Secondary | ICD-10-CM | POA: Diagnosis not present

## 2017-03-05 DIAGNOSIS — E119 Type 2 diabetes mellitus without complications: Secondary | ICD-10-CM | POA: Diagnosis present

## 2017-03-05 DIAGNOSIS — Z833 Family history of diabetes mellitus: Secondary | ICD-10-CM | POA: Diagnosis not present

## 2017-03-05 DIAGNOSIS — Z91048 Other nonmedicinal substance allergy status: Secondary | ICD-10-CM | POA: Diagnosis not present

## 2017-03-05 DIAGNOSIS — Z885 Allergy status to narcotic agent status: Secondary | ICD-10-CM | POA: Diagnosis not present

## 2017-03-05 DIAGNOSIS — Z951 Presence of aortocoronary bypass graft: Secondary | ICD-10-CM | POA: Diagnosis not present

## 2017-03-05 DIAGNOSIS — I110 Hypertensive heart disease with heart failure: Secondary | ICD-10-CM | POA: Diagnosis not present

## 2017-03-05 DIAGNOSIS — I5023 Acute on chronic systolic (congestive) heart failure: Secondary | ICD-10-CM | POA: Diagnosis present

## 2017-03-05 DIAGNOSIS — Z95811 Presence of heart assist device: Secondary | ICD-10-CM | POA: Diagnosis not present

## 2017-03-05 DIAGNOSIS — I509 Heart failure, unspecified: Secondary | ICD-10-CM | POA: Diagnosis not present

## 2017-03-05 DIAGNOSIS — I82621 Acute embolism and thrombosis of deep veins of right upper extremity: Secondary | ICD-10-CM | POA: Diagnosis present

## 2017-03-05 DIAGNOSIS — J9811 Atelectasis: Secondary | ICD-10-CM | POA: Diagnosis present

## 2017-03-05 DIAGNOSIS — Z888 Allergy status to other drugs, medicaments and biological substances status: Secondary | ICD-10-CM | POA: Diagnosis not present

## 2017-03-05 DIAGNOSIS — I480 Paroxysmal atrial fibrillation: Secondary | ICD-10-CM | POA: Diagnosis present

## 2017-03-05 DIAGNOSIS — N179 Acute kidney failure, unspecified: Secondary | ICD-10-CM | POA: Diagnosis present

## 2017-03-05 DIAGNOSIS — E871 Hypo-osmolality and hyponatremia: Secondary | ICD-10-CM | POA: Diagnosis not present

## 2017-03-05 DIAGNOSIS — J449 Chronic obstructive pulmonary disease, unspecified: Secondary | ICD-10-CM | POA: Diagnosis present

## 2017-03-05 DIAGNOSIS — D509 Iron deficiency anemia, unspecified: Secondary | ICD-10-CM | POA: Diagnosis present

## 2017-03-05 DIAGNOSIS — I2510 Atherosclerotic heart disease of native coronary artery without angina pectoris: Secondary | ICD-10-CM | POA: Diagnosis present

## 2017-03-05 DIAGNOSIS — Z9119 Patient's noncompliance with other medical treatment and regimen: Secondary | ICD-10-CM | POA: Diagnosis not present

## 2017-03-05 LAB — BASIC METABOLIC PANEL
ANION GAP: 10 (ref 5–15)
Anion gap: 7 (ref 5–15)
Anion gap: 7 (ref 5–15)
BUN: 29 mg/dL — ABNORMAL HIGH (ref 6–20)
BUN: 28 mg/dL — ABNORMAL HIGH (ref 6–20)
BUN: 29 mg/dL — ABNORMAL HIGH (ref 6–20)
CALCIUM: 8.4 mg/dL — AB (ref 8.9–10.3)
CHLORIDE: 87 mmol/L — AB (ref 101–111)
CHLORIDE: 88 mmol/L — AB (ref 101–111)
CO2: 28 mmol/L (ref 22–32)
CO2: 33 mmol/L — AB (ref 22–32)
CO2: 33 mmol/L — AB (ref 22–32)
CREATININE: 1.21 mg/dL (ref 0.61–1.24)
CREATININE: 1.29 mg/dL — AB (ref 0.61–1.24)
Calcium: 8.6 mg/dL — ABNORMAL LOW (ref 8.9–10.3)
Calcium: 8.7 mg/dL — ABNORMAL LOW (ref 8.9–10.3)
Chloride: 80 mmol/L — ABNORMAL LOW (ref 101–111)
Creatinine, Ser: 1.36 mg/dL — ABNORMAL HIGH (ref 0.61–1.24)
GFR calc Af Amer: >60 mL/min (ref >60)
GFR calc non Af Amer: 55 mL/min — ABNORMAL LOW (ref >60)
GFR calc non Af Amer: >60 mL/min (ref >60)
GFR calc non Af Amer: 59 mL/min — ABNORMAL LOW (ref >60)
GLUCOSE: 163 mg/dL — AB (ref 65–99)
Glucose, Bld: 150 mg/dL — ABNORMAL HIGH (ref 65–99)
Glucose, Bld: 160 mg/dL — ABNORMAL HIGH (ref 65–99)
POTASSIUM: 3.4 mmol/L — AB (ref 3.5–5.1)
POTASSIUM: 3.8 mmol/L (ref 3.5–5.1)
Potassium: 3.6 mmol/L (ref 3.5–5.1)
SODIUM: 127 mmol/L — AB (ref 135–145)
SODIUM: 128 mmol/L — AB (ref 135–145)
Sodium: 118 mmol/L — CL (ref 135–145)

## 2017-03-05 LAB — CBC
HEMATOCRIT: 26.4 % — AB (ref 39.0–52.0)
Hemoglobin: 8 g/dL — ABNORMAL LOW (ref 13.0–17.0)
MCH: 22.9 pg — AB (ref 26.0–34.0)
MCHC: 30.3 g/dL (ref 30.0–36.0)
MCV: 75.4 fL — AB (ref 78.0–100.0)
Platelets: 199 10*3/uL (ref 150–400)
RBC: 3.5 MIL/uL — ABNORMAL LOW (ref 4.22–5.81)
RDW: 17.9 % — AB (ref 11.5–15.5)
WBC: 6.3 10*3/uL (ref 4.0–10.5)

## 2017-03-05 LAB — IRON AND TIBC
Iron: 15 ug/dL — ABNORMAL LOW (ref 45–182)
SATURATION RATIOS: 4 % — AB (ref 17.9–39.5)
TIBC: 416 ug/dL (ref 250–450)
UIBC: 401 ug/dL

## 2017-03-05 LAB — GLUCOSE, CAPILLARY
GLUCOSE-CAPILLARY: 170 mg/dL — AB (ref 65–99)
GLUCOSE-CAPILLARY: 185 mg/dL — AB (ref 65–99)
GLUCOSE-CAPILLARY: 186 mg/dL — AB (ref 65–99)
GLUCOSE-CAPILLARY: 122 mg/dL — AB (ref 65–99)
Glucose-Capillary: 180 mg/dL — ABNORMAL HIGH (ref 65–99)

## 2017-03-05 LAB — COOXEMETRY PANEL
Carboxyhemoglobin: 1.6 % — ABNORMAL HIGH (ref 0.5–1.5)
Methemoglobin: 1.4 % (ref 0.0–1.5)
O2 SAT: 63.5 %
Total hemoglobin: 8.3 g/dL — ABNORMAL LOW (ref 12.0–16.0)

## 2017-03-05 LAB — MRSA PCR SCREENING: MRSA BY PCR: NEGATIVE

## 2017-03-05 LAB — PROTIME-INR
INR: 2.13
Prothrombin Time: 24.1 seconds — ABNORMAL HIGH (ref 11.4–15.2)

## 2017-03-05 MED ORDER — SODIUM CHLORIDE 0.9% FLUSH: 10.0000 mL | INTRAVENOUS | Status: DC | PRN

## 2017-03-05 MED ORDER — FUROSEMIDE 10 MG/ML IJ SOLN
80.0000 mg | Freq: Two times a day (BID) | INTRAMUSCULAR | Status: AC
  Administered 2017-03-05 – 2017-03-06 (×4): 80 mg via INTRAVENOUS
  Filled 2017-03-05 (×4): qty 8

## 2017-03-05 MED ORDER — SPIRONOLACTONE 25 MG PO TABS
25.0000 mg | ORAL_TABLET | Freq: Every day | ORAL | Status: DC
  Administered 2017-03-05 – 2017-03-08 (×4): 25 mg via ORAL
  Filled 2017-03-05 (×4): qty 1

## 2017-03-05 MED ORDER — SODIUM CHLORIDE 0.9 % IV BOLUS (SEPSIS)
500.0000 mL | Freq: Once | INTRAVENOUS | Status: AC
  Administered 2017-03-05: 500 mL via INTRAVENOUS

## 2017-03-05 MED ORDER — WARFARIN SODIUM 10 MG PO TABS: 10.0000 mg | ORAL_TABLET | ORAL | Status: DC

## 2017-03-05 MED ORDER — ACETAMINOPHEN 325 MG PO TABS
650.0000 mg | ORAL_TABLET | ORAL | Status: DC | PRN
  Administered 2017-03-05 (×2): 650 mg via ORAL
  Filled 2017-03-05 (×2): qty 2

## 2017-03-05 MED ORDER — POTASSIUM CHLORIDE CRYS ER 20 MEQ PO TBCR
40.0000 meq | EXTENDED_RELEASE_TABLET | Freq: Two times a day (BID) | ORAL | Status: DC
  Administered 2017-03-05 – 2017-03-08 (×7): 40 meq via ORAL
  Filled 2017-03-05 (×7): qty 2

## 2017-03-05 MED ORDER — INSULIN GLARGINE 100 UNIT/ML ~~LOC~~ SOLN
20.0000 [IU] | Freq: Every day | SUBCUTANEOUS | Status: DC
  Administered 2017-03-05 – 2017-03-07 (×3): 20 [IU] via SUBCUTANEOUS
  Filled 2017-03-05 (×4): qty 0.2

## 2017-03-05 MED ORDER — SODIUM CHLORIDE 0.9% FLUSH
10.0000 mL | Freq: Two times a day (BID) | INTRAVENOUS | Status: DC
  Administered 2017-03-05 – 2017-03-06 (×3): 10 mL
  Administered 2017-03-07: 20 mL

## 2017-03-05 MED ORDER — PANTOPRAZOLE SODIUM 40 MG PO TBEC
40.0000 mg | DELAYED_RELEASE_TABLET | Freq: Every day | ORAL | Status: DC
  Administered 2017-03-05 – 2017-03-08 (×4): 40 mg via ORAL
  Filled 2017-03-05 (×4): qty 1

## 2017-03-05 MED ORDER — AMIODARONE HCL 200 MG PO TABS
200.0000 mg | ORAL_TABLET | Freq: Every day | ORAL | Status: DC
  Administered 2017-03-05 – 2017-03-08 (×4): 200 mg via ORAL
  Filled 2017-03-05 (×4): qty 1

## 2017-03-05 MED ORDER — INSULIN ASPART 100 UNIT/ML ~~LOC~~ SOLN: 0.0000 [IU] | Freq: Every day | SUBCUTANEOUS | Status: DC

## 2017-03-05 MED ORDER — WARFARIN SODIUM 7.5 MG PO TABS
15.0000 mg | ORAL_TABLET | ORAL | Status: DC
  Administered 2017-03-05: 15 mg via ORAL
  Filled 2017-03-05: qty 2

## 2017-03-05 MED ORDER — DIGOXIN 125 MCG PO TABS
0.2500 mg | ORAL_TABLET | Freq: Every day | ORAL | Status: DC
  Administered 2017-03-05 – 2017-03-08 (×4): 0.25 mg via ORAL
  Filled 2017-03-05 (×4): qty 2

## 2017-03-05 MED ORDER — TOLVAPTAN 15 MG PO TABS
30.0000 mg | ORAL_TABLET | Freq: Once | ORAL | Status: AC
  Administered 2017-03-05: 30 mg via ORAL
  Filled 2017-03-05: qty 2

## 2017-03-05 MED ORDER — GABAPENTIN 100 MG PO CAPS
200.0000 mg | ORAL_CAPSULE | Freq: Two times a day (BID) | ORAL | Status: DC
  Administered 2017-03-05 – 2017-03-08 (×8): 200 mg via ORAL
  Filled 2017-03-05 (×8): qty 2

## 2017-03-05 MED ORDER — TRAMADOL HCL 50 MG PO TABS
50.0000 mg | ORAL_TABLET | Freq: Three times a day (TID) | ORAL | Status: DC | PRN
  Administered 2017-03-05: 50 mg via ORAL
  Filled 2017-03-05 (×2): qty 1

## 2017-03-05 MED ORDER — WARFARIN - PHARMACIST DOSING INPATIENT
Freq: Every day | Status: DC
  Administered 2017-03-05 – 2017-03-07 (×3)

## 2017-03-05 MED ORDER — MAGNESIUM OXIDE 400 (241.3 MG) MG PO TABS
400.0000 mg | ORAL_TABLET | Freq: Every day | ORAL | Status: DC
  Administered 2017-03-05 – 2017-03-08 (×4): 400 mg via ORAL
  Filled 2017-03-05 (×4): qty 1

## 2017-03-05 MED ORDER — INSULIN ASPART 100 UNIT/ML ~~LOC~~ SOLN
0.0000 [IU] | Freq: Three times a day (TID) | SUBCUTANEOUS | Status: DC
  Administered 2017-03-05 (×3): 3 [IU] via SUBCUTANEOUS
  Administered 2017-03-06: 5 [IU] via SUBCUTANEOUS
  Administered 2017-03-06: 2 [IU] via SUBCUTANEOUS
  Administered 2017-03-06 – 2017-03-07 (×3): 3 [IU] via SUBCUTANEOUS
  Administered 2017-03-08: 5 [IU] via SUBCUTANEOUS
  Administered 2017-03-08: 3 [IU] via SUBCUTANEOUS

## 2017-03-05 MED ORDER — TOLVAPTAN 15 MG PO TABS
15.0000 mg | ORAL_TABLET | Freq: Once | ORAL | Status: AC
  Administered 2017-03-05: 15 mg via ORAL
  Filled 2017-03-05: qty 1

## 2017-03-05 MED ORDER — ASPIRIN EC 81 MG PO TBEC
81.0000 mg | DELAYED_RELEASE_TABLET | Freq: Every day | ORAL | Status: DC
  Administered 2017-03-05 – 2017-03-08 (×4): 81 mg via ORAL
  Filled 2017-03-05 (×4): qty 1

## 2017-03-05 MED ORDER — INSULIN ASPART 100 UNIT/ML ~~LOC~~ SOLN
10.0000 [IU] | Freq: Three times a day (TID) | SUBCUTANEOUS | Status: DC
  Administered 2017-03-05 – 2017-03-08 (×11): 10 [IU] via SUBCUTANEOUS

## 2017-03-05 MED ORDER — ONDANSETRON HCL 4 MG/2ML IJ SOLN
4.0000 mg | Freq: Four times a day (QID) | INTRAMUSCULAR | Status: DC | PRN
  Administered 2017-03-05 – 2017-03-07 (×4): 4 mg via INTRAVENOUS
  Filled 2017-03-05 (×4): qty 2

## 2017-03-05 MED ORDER — DOBUTAMINE IN D5W 4-5 MG/ML-% IV SOLN
5.0000 ug/kg/min | INTRAVENOUS | Status: DC
  Administered 2017-03-05 – 2017-03-07 (×3): 5 ug/kg/min via INTRAVENOUS
  Filled 2017-03-05 (×3): qty 250

## 2017-03-05 MED ORDER — SILDENAFIL CITRATE 20 MG PO TABS
40.0000 mg | ORAL_TABLET | Freq: Three times a day (TID) | ORAL | Status: DC
Start: 2017-03-05 — End: 2017-03-08
  Administered 2017-03-05 – 2017-03-08 (×11): 40 mg via ORAL
  Filled 2017-03-05 (×11): qty 2

## 2017-03-05 MED ORDER — SODIUM CHLORIDE 0.9 % IV SOLN
510.0000 mg | Freq: Once | INTRAVENOUS | Status: AC
  Administered 2017-03-05: 510 mg via INTRAVENOUS
  Filled 2017-03-05 (×2): qty 17

## 2017-03-05 MED ORDER — CITALOPRAM HYDROBROMIDE 10 MG PO TABS
10.0000 mg | ORAL_TABLET | Freq: Every day | ORAL | Status: DC
  Administered 2017-03-05 – 2017-03-08 (×4): 10 mg via ORAL
  Filled 2017-03-05 (×4): qty 1

## 2017-03-05 MED ORDER — ALTEPLASE 2 MG IJ SOLR
2.0000 mg | Freq: Once | INTRAMUSCULAR | Status: AC
  Administered 2017-03-05: 2 mg
  Filled 2017-03-05: qty 2

## 2017-03-05 MED ORDER — DOCUSATE SODIUM 100 MG PO CAPS
200.0000 mg | ORAL_CAPSULE | Freq: Every evening | ORAL | Status: DC
  Administered 2017-03-05 – 2017-03-07 (×3): 200 mg via ORAL
  Filled 2017-03-05 (×3): qty 2

## 2017-03-05 MED ORDER — CHLORHEXIDINE GLUCONATE CLOTH 2 % EX PADS
6.0000 | MEDICATED_PAD | Freq: Every day | CUTANEOUS | Status: DC
  Administered 2017-03-06 – 2017-03-08 (×3): 6 via TOPICAL

## 2017-03-05 NOTE — Progress Notes (Signed)
ANTICOAGULATION CONSULT NOTE - Initial Consult  Pharmacy Consult for Coumadin Indication: LVAD  Allergies  Allergen Reactions  . Codeine Nausea And Vomiting    In high doses  . Trazodone And Nefazodone Other (See Comments)    Dizziness with subsequent fall   . Lipitor [Atorvastatin] Nausea Only    Nausea with high doses, tolerates 20mg  dose (08/22/16)  . Tape Itching    Paper tape is ok    Patient Measurements: Height: 5\' 9"  (175.3 cm) Weight: 233 lb (105.7 kg) IBW/kg (Calculated) : 70.7  Vital Signs: Temp: 99.2 F (37.3 C) (06/01 2046) Temp Source: Oral (06/01 2046) Pulse Rate: 80 (06/01 2046)  Labs:  Recent Labs  03/02/17 1105 03/04/17 2055  HGB 8.7* 8.2*  HCT 28.3* 26.9*  PLT 218 221  LABPROT 29.8* 24.3*  INR 2.77 2.15  CREATININE 1.55* 1.54*    Estimated Creatinine Clearance: 61.1 mL/min (A) (by C-G formula based on SCr of 1.54 mg/dL (H)).   Medical History: Past Medical History:  Diagnosis Date  . AICD (automatic cardioverter/defibrillator) present   . ASCVD (arteriosclerotic cardiovascular disease)   . Chronic systolic CHF (congestive heart failure) ([place])   . COPD (chronic obstructive pulmonary disease) ([place])   . Coronary artery disease   . Depression   . Diabetes mellitus   . History of cocaine abuse   . Hypertension   . Presence of permanent cardiac pacemaker   . Shortness of breath dyspnea   . Suicidal ideation   . Tobacco abuse     Medications:  See electronic med rec  Assessment: 61 y.o. M presents with volume overload and N/V. Pt on coumadin for anticoagulation for LVAD. Admission INR 2.15 (therapeutic). Noted pt also with new RUE DVT during last admission (01/26/17).  Home dose of coumadin: 15mg  nightly except for 10mg  on Tuesdays (last dose PTA taken 5/31)  Goal of Therapy:  INR 2-2.5 Monitor platelets by anticoagulation protocol: Yes   Plan:  Daily INR Continue home coumadin for now - 15mg  daily except for 10mg  on  Tuesdays  Sherlon Handing, PharmD, BCPS Clinical pharmacist, pager [phone] 03/05/2017,1:41 AM

## 2017-03-05 NOTE — ED Notes (Signed)
Rapid response RN coming to assess.

## 2017-03-05 NOTE — Plan of Care (Signed)
Problem: Fluid Volume: Goal: Ability to maintain a balanced intake and output will improve Outcome: Progressing Diuresed today

## 2017-03-05 NOTE — Plan of Care (Signed)
Problem: Cardiac: Goal: LVAD will function as expected and patient will experience no clinical alarms Outcome: Progressing Within parameters at this time  Problem: Education: Goal: Patient will understand all VAD equipment and how it functions Outcome: Progressing Education ongoing

## 2017-03-05 NOTE — Progress Notes (Signed)
[facility]  Active pt with [facility] prior to this readmission.   [facility] is providing HHRN and home Infusion services for home Dobutamine.  [facility] will follow Ronald Miller while an inpatient to support transition home when ordered.  If patient discharges after hours, please call [phone].   [name] 03/05/2017, 10:01 AM

## 2017-03-05 NOTE — Progress Notes (Signed)
Pt continues to ignore instructions regarding calling for help to get up or reposition. Continues to scratch off drive line anchor after reapplying twice. Eduction given again and pt warned of potential dangers of falling and removing drive line. Pt continues to be non-complaint. Will continue to monitor closely. Eleonore Chiquito RN  2 Heart

## 2017-03-05 NOTE — Progress Notes (Signed)
HeartMate 2 Rounding Note  Subjective:    Admitted last night with RV failure, N/V and hyponatremia.   Still feels nauseated/bloated. Diuresed moderately. CVP 12-14.   Sodium 120->118   LVAD INTERROGATION:  HeartMate II LVAD:  Flow 5.5 liters/min, speed 5900, power 5.0, PI 3.4.    Objective:    Vital Signs:   Temp:  [97.6 F (36.4 C)-99.2 F (37.3 C)] 98.1 F (36.7 C) (06/02 0350) Pulse Rate:  [42-189] 189 (06/02 0500) Resp:  [10-22] 15 (06/02 0600) SpO2:  [91 %-96 %] 94 % (06/02 0600) Weight:  [105.7 kg (233 lb)-106.3 kg (234 lb 5.6 oz)] 106.3 kg (234 lb 5.6 oz) (06/02 0300) Last BM Date: 03/04/17 Mean arterial Pressure 70-80s  Intake/Output:   Intake/Output Summary (Last 24 hours) at 03/05/17 0836 Last data filed at 03/05/17 0823  Gross per 24 hour  Intake           588.53 ml  Output             1300 ml  Net          -711.47 ml     Physical Exam: General:  Lying flat in bed. NAD HEENT: normal anicteric Neck: supple. JVP to jaw . Carotids 2+ bilat; no bruits. No lymphadenopathy or thryomegaly appreciated. Cor: Mechanical heart sounds with LVAD hum present. Normal HM3 sounds Lungs: clear Abdomen: soft, nontender, mildly distended. No hepatosplenomegaly. No bruits or masses. Good bowel sounds. Driveline: C/D/I; securement device intact and driveline incorporated Extremities: no cyanosis, clubbing, rash, edema Neuro: alert & orientedx3, cranial nerves grossly intact. moves all 4 extremities w/o difficulty. Affect pleasant  Telemetry: NSR 90s +PVCs. Personally reviewed   Labs: Basic Metabolic Panel:  Recent Labs Lab 03/02/17 1105 03/04/17 2055 03/05/17 0342  NA 123* 120* 118*  K 3.6 3.9 3.4*  CL 79* 81* 80*  CO2 34* 29 28  GLUCOSE 243* 237* 163*  BUN 42* 33* 29*  CREATININE 1.55* 1.54* 1.36*  CALCIUM 8.8* 8.5* 8.4*    Liver Function Tests: No results for input(s): AST, ALT, ALKPHOS, BILITOT, PROT, ALBUMIN in the last 168 hours. No results for  input(s): LIPASE, AMYLASE in the last 168 hours. No results for input(s): AMMONIA in the last 168 hours.  CBC:  Recent Labs Lab 03/02/17 1105 03/04/17 2055 03/05/17 0342  WBC 7.2 7.0 6.3  HGB 8.7* 8.2* 8.0*  HCT 28.3* 26.9* 26.4*  MCV 75.9* 75.4* 75.4*  PLT 218 221 199    INR:  Recent Labs Lab 03/02/17 1105 03/04/17 2055 03/05/17 0342  INR 2.77 2.15 2.13    Other results:    Imaging: Dg Chest Portable 1 View  Result Date: 03/04/2017 CLINICAL DATA:  Shortness of Breath EXAM: PORTABLE CHEST 1 VIEW COMPARISON:  01/23/2017 FINDINGS: Cardiac shadow is enlarged. LVAD is again seen. Defibrillator is noted. Tunneled right jugular PICC line is seen in satisfactory position. No focal infiltrate or sizable effusion is seen. IMPRESSION: Tubes and lines as described.  No acute abnormality is noted. Electronically Signed   By: Inez Catalina M.D.   On: 03/04/2017 21:23      Medications:     Scheduled Medications: . amiodarone  200 mg Oral Daily  . aspirin EC  81 mg Oral Daily  . citalopram  10 mg Oral Daily  . digoxin  0.25 mg Oral Daily  . docusate sodium  200 mg Oral QPM  . furosemide  80 mg Intravenous BID  . gabapentin  200 mg Oral BID  .  insulin aspart  0-15 Units Subcutaneous TID WC  . insulin aspart  0-5 Units Subcutaneous QHS  . insulin aspart  10 Units Subcutaneous TID WC  . insulin glargine  20 Units Subcutaneous Q2200  . magnesium oxide  400 mg Oral Daily  . pantoprazole  40 mg Oral Daily  . potassium chloride  40 mEq Oral BID  . sildenafil  40 mg Oral TID  . spironolactone  25 mg Oral Daily  . tolvaptan  30 mg Oral Once  . [START ON 03/08/2017] warfarin  10 mg Oral Q Tue-1800  . warfarin  15 mg Oral Once per day on Sun Mon Wed Thu Fri Sat  . Warfarin - Pharmacist Dosing Inpatient   Does not apply q1800     Infusions: . DOBUTamine 5 mcg/kg/min (03/05/17 0800)  . ferumoxytol    . sodium chloride       PRN Medications:  acetaminophen, ondansetron  (ZOFRAN) IV, sodium chloride flush, traMADol   Assessment/Plan:   1. 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 R>>L  - Echo 08/2016 with EF of 15%. s/p HM3 LVAD 09/2016 - Post VAD course c/b RV failure. Now on dobutamine - CVP up in setting of RV failure. Weight close to baseline. Co-ox ok. - Continue dobutamine for RV support - Continue sildenafil. - VAD parameters stable.  - With worsening hyponatremia will give 500cc NS and 1 dose tolvaptan 30. Recheck BMET at noon. Continue lasix for now. Discussed with PharmD - He was seen at [facility] - Dr. Hoyt Koch for transplant recently. Doubt he will be transplant candidate due mainly to social issues - LDH 238 - INR 2.13. Continue coumadin. Dosing per PharmD 2. Hyponatremia - Continues to worsen. Suspect due to diuretics and heavy water/ice intake - Will repeat tolvapatan. Give NS 500cc x 1. Recheck BMET at noon. Continue lasix for now. Discussed with PharmD - Fluid restrict 3. CAD s/p CABG - No ischemic symptoms - On ASA 4. Nausea/vomiting - suspect due to RV failure and gut edema. Improved slightly with diuresis. 5. DM  - Improved control this am. Continue current insulin regimen. D/w PharmD - Sliding scale ordered.  6. Iron deficiency anemia - Iron stores low. Give Feraheme 7. Hypokalemia - Will supp  I reviewed the LVAD parameters from today, and compared the results to the patient's prior recorded data.  No programming changes were made.  The LVAD is functioning within specified parameters.  The patient performs LVAD self-test daily.  LVAD interrogation was negative for any significant power changes, alarms or PI events/speed drops.  LVAD equipment check completed and is in good working order.  Back-up equipment present.   LVAD education done on emergency procedures and precautions and reviewed exit site care.  Length of Stay: 0  Glori Bickers MD 03/05/2017, 8:36 AM  VAD Team --- VAD ISSUES ONLY--- Pager [phone] (7am -  7am)  Advanced Heart Failure Team  Pager [phone] (M-F; 7a - 4p)  Please contact [facility] for night-coverage after hours (4p -7a ) and weekends on [email or web address]

## 2017-03-05 NOTE — Progress Notes (Signed)
ANTICOAGULATION CONSULT NOTE - Follow Up Consult  Pharmacy Consult for Coumadin Indication: LVAD  Allergies  Allergen Reactions  . Codeine Nausea And Vomiting    In high doses  . Trazodone And Nefazodone Other (See Comments)    Dizziness with subsequent fall   . Lipitor [Atorvastatin] Nausea Only    Nausea with high doses, tolerates 20mg  dose (08/22/16)  . Tape Itching    Paper tape is ok    Patient Measurements: Height: 5' 9.02" (175.3 cm) Weight: 234 lb 5.6 oz (106.3 kg) IBW/kg (Calculated) : 70.74  Vital Signs: Temp: 98.1 F (36.7 C) (06/02 0350) Temp Source: Oral (06/02 0350) Pulse Rate: 189 (06/02 0500)  Labs:  Recent Labs  03/02/17 1105 03/04/17 2055 03/05/17 0342  HGB 8.7* 8.2* 8.0*  HCT 28.3* 26.9* 26.4*  PLT 218 221 199  LABPROT 29.8* 24.3* 24.1*  INR 2.77 2.15 2.13  CREATININE 1.55* 1.54* 1.36*    Estimated Creatinine Clearance: 69.4 mL/min (A) (by C-G formula based on SCr of 1.36 mg/dL (H)).   Medical History: Past Medical History:  Diagnosis Date  . AICD (automatic cardioverter/defibrillator) present   . ASCVD (arteriosclerotic cardiovascular disease)   . Chronic systolic CHF (congestive heart failure) ([place])   . COPD (chronic obstructive pulmonary disease) ([place])   . Coronary artery disease   . Depression   . Diabetes mellitus   . History of cocaine abuse   . Hypertension   . Presence of permanent cardiac pacemaker   . Shortness of breath dyspnea   . Suicidal ideation   . Tobacco abuse       Assessment: 61 y.o. M presents with volume overload and N/V. Pt on coumadin for anticoagulation for LVAD. Admission INR 2.15 (therapeutic) and remains therapeutic 2.13 this am. Noted pt also with new RUE DVT during last admission (01/26/17).  Home dose of coumadin: 15mg  nightly except for 10mg  on Tuesdays (last dose PTA taken 5/31) No bleeding noted.  H/h drifting down slightly, FE studies low replace with Feraheme.   Goal of Therapy:  INR  2-2.5 Monitor platelets by anticoagulation protocol: Yes   Plan:  Daily INR Continue home coumadin  - 15mg  daily except for 10mg  on Tuesdays Feraheme 510mg  IV x1  Bonnita Nasuti Pharm.D. CPP, BCPS Clinical Pharmacist [phone] 03/05/2017 7:38 AM

## 2017-03-05 NOTE — Progress Notes (Signed)
CRITICAL VALUE ALERT  Critical Value:  NA 118  Date & Time Notied:  9924 03/05/17  Provider Notified: Bensimhon not notified. Consistent with previous value. Treatment in progress. MD to round  Orders Received/Actions taken: MD to round. Treatment in progress.

## 2017-03-06 LAB — BASIC METABOLIC PANEL
ANION GAP: 9 (ref 5–15)
BUN: 26 mg/dL — ABNORMAL HIGH (ref 6–20)
CALCIUM: 8.7 mg/dL — AB (ref 8.9–10.3)
CO2: 32 mmol/L (ref 22–32)
Chloride: 87 mmol/L — ABNORMAL LOW (ref 101–111)
Creatinine, Ser: 1.28 mg/dL — ABNORMAL HIGH (ref 0.61–1.24)
GFR calc non Af Amer: 59 mL/min — ABNORMAL LOW (ref >60)
Glucose, Bld: 162 mg/dL — ABNORMAL HIGH (ref 65–99)
POTASSIUM: 4.1 mmol/L (ref 3.5–5.1)
Sodium: 128 mmol/L — ABNORMAL LOW (ref 135–145)

## 2017-03-06 LAB — CBC
HCT: 27.3 % — ABNORMAL LOW (ref 39.0–52.0)
HEMOGLOBIN: 8.1 g/dL — AB (ref 13.0–17.0)
MCH: 23.1 pg — ABNORMAL LOW (ref 26.0–34.0)
MCHC: 29.7 g/dL — ABNORMAL LOW (ref 30.0–36.0)
MCV: 78 fL (ref 78.0–100.0)
Platelets: 200 10*3/uL (ref 150–400)
RBC: 3.5 MIL/uL — AB (ref 4.22–5.81)
RDW: 18 % — AB (ref 11.5–15.5)
WBC: 5.6 10*3/uL (ref 4.0–10.5)

## 2017-03-06 LAB — GLUCOSE, CAPILLARY
GLUCOSE-CAPILLARY: 227 mg/dL — AB (ref 65–99)
GLUCOSE-CAPILLARY: 147 mg/dL — AB (ref 65–99)
Glucose-Capillary: 148 mg/dL — ABNORMAL HIGH (ref 65–99)
Glucose-Capillary: 152 mg/dL — ABNORMAL HIGH (ref 65–99)

## 2017-03-06 LAB — PROTIME-INR
INR: 1.72
Prothrombin Time: 20.4 seconds — ABNORMAL HIGH (ref 11.4–15.2)

## 2017-03-06 LAB — COOXEMETRY PANEL
CARBOXYHEMOGLOBIN: 1.6 % — AB (ref 0.5–1.5)
METHEMOGLOBIN: 1.7 % — AB (ref 0.0–1.5)
O2 SAT: 55.9 %
Total hemoglobin: 8.2 g/dL — ABNORMAL LOW (ref 12.0–16.0)

## 2017-03-06 LAB — MAGNESIUM: MAGNESIUM: 2.2 mg/dL (ref 1.7–2.4)

## 2017-03-06 LAB — LACTATE DEHYDROGENASE: LDH: 208 U/L — AB (ref 98–192)

## 2017-03-06 LAB — TYPE AND SCREEN
ABO/RH(D): O POS
ANTIBODY SCREEN: NEGATIVE

## 2017-03-06 LAB — DIGOXIN LEVEL: DIGOXIN LVL: 0.8 ng/mL (ref 0.8–2.0)

## 2017-03-06 MED ORDER — WARFARIN SODIUM 7.5 MG PO TABS: 15.0000 mg | ORAL_TABLET | Freq: Once | ORAL | Status: DC

## 2017-03-06 MED ORDER — WARFARIN SODIUM 10 MG PO TABS
20.0000 mg | ORAL_TABLET | Freq: Once | ORAL | Status: AC
  Administered 2017-03-06: 20 mg via ORAL
  Filled 2017-03-06: qty 2

## 2017-03-06 MED ORDER — SORBITOL 70 % SOLN
30.0000 mL | Freq: Once | Status: AC
  Administered 2017-03-06: 30 mL via ORAL
  Filled 2017-03-06: qty 30

## 2017-03-06 NOTE — Progress Notes (Signed)
ANTICOAGULATION CONSULT NOTE - Follow Up Consult  Pharmacy Consult for Coumadin Indication: LVAD  Allergies  Allergen Reactions  . Codeine Nausea And Vomiting    In high doses  . Trazodone And Nefazodone Other (See Comments)    Dizziness with subsequent fall   . Lipitor [Atorvastatin] Nausea Only    Nausea with high doses, tolerates 20mg  dose (08/22/16)  . Tape Itching    Paper tape is ok    Patient Measurements: Height: 5' 9.02" (175.3 cm) Weight: 231 lb 4.2 oz (104.9 kg) IBW/kg (Calculated) : 70.74  Vital Signs: Temp: 97.5 F (36.4 C) (06/03 0700) Temp Source: Oral (06/03 0700) Pulse Rate: 81 (06/03 0944)  Labs:  Recent Labs  03/04/17 2055 03/05/17 0342 03/05/17 1307 03/05/17 1727 03/06/17 0430  HGB 8.2* 8.0*  --   --  8.1*  HCT 26.9* 26.4*  --   --  27.3*  PLT 221 199  --   --  200  LABPROT 24.3* 24.1*  --   --  20.4*  INR 2.15 2.13  --   --  1.72  CREATININE 1.54* 1.36* 1.21 1.29* 1.28*    Estimated Creatinine Clearance: 73.3 mL/min (A) (by C-G formula based on SCr of 1.28 mg/dL (H)).   Medical History: Past Medical History:  Diagnosis Date  . AICD (automatic cardioverter/defibrillator) present   . ASCVD (arteriosclerotic cardiovascular disease)   . Chronic systolic CHF (congestive heart failure) ([place])   . COPD (chronic obstructive pulmonary disease) ([place])   . Coronary artery disease   . Depression   . Diabetes mellitus   . History of cocaine abuse   . Hypertension   . Presence of permanent cardiac pacemaker   . Shortness of breath dyspnea   . Suicidal ideation   . Tobacco abuse       Assessment: 61 y.o. M presents with volume overload and N/V. Pt on coumadin for anticoagulation for LVAD. Admission INR 2.15 (therapeutic) but fell last nite to 1.7 - will boost dose.   Noted pt also with new RUE DVT during last admission (01/26/17).  Home dose of coumadin: 15mg  nightly except for 10mg  on Tuesdays (last dose PTA taken 5/31) No bleeding noted.   H/h drifting down slightly, FE studies low replace with Feraheme.   Goal of Therapy:  INR 2-2.5 Monitor platelets by anticoagulation protocol: Yes   Plan:  Daily INR Warfarin 20mg  x1    Bonnita Nasuti Pharm.D. CPP, BCPS Clinical Pharmacist [phone] 03/06/2017 11:50 AM

## 2017-03-06 NOTE — Progress Notes (Signed)
HeartMate 2 Rounding Note  Subjective:    Admitted 6/1 with RV failure, N/V and hyponatremia.   Feeling better. Nausea resolved. Bloating improved.  Diuresing slowly on IV lasix. Weight down 3 pounds. Co-ox 64%->56% on dobutamine 5. CVP 19  Sodium 120->118-> 128  LVAD INTERROGATION:  HeartMate II LVAD:  Flow 5.0 liters/min, speed 5900, power 5.0, PI 3.0.  Occasional PI event  Objective:    Vital Signs:   Temp:  [96.5 F (35.8 C)-98.2 F (36.8 C)] 97.5 F (36.4 C) (06/03 0700) Pulse Rate:  [67-81] 81 (06/03 0944) Resp:  [11-26] 15 (06/03 0900) BP: (72-91)/(43-72) 72/43 (06/02 1900) SpO2:  [90 %-100 %] 95 % (06/03 0800) Weight:  [104.9 kg (231 lb 4.2 oz)] 104.9 kg (231 lb 4.2 oz) (06/03 0400) Last BM Date: 03/05/17 Mean arterial Pressure 80s  Intake/Output:   Intake/Output Summary (Last 24 hours) at 03/06/17 1219 Last data filed at 03/06/17 1200  Gross per 24 hour  Intake             1792 ml  Output             4120 ml  Net            -2328 ml     Physical Exam: General:  Sitting up in bed. NAD HEENT: normal anicteric  Neck: supple. JVP to jaw . Carotids 2+ bilat; no bruits. No lymphadenopathy or thryomegaly appreciated. Cor: Mechanical heart sounds with LVAD hum present. Normal HM3 sounds Lungs: clear. No wheezing Abdomen: soft, nontender, nondistended . No hepatosplenomegaly. No bruits or masses. Good bowel sounds. Driveline: C/D/I; securement device intact and driveline incorporated no signs infections Extremities: no cyanosis, clubbing, rash. No edema. warm Neuro: alert & orientedx3, cranial nerves grossly intact. moves all 4 extremities w/o difficulty. Affect flat  Telemetry: NSR 80-90s. +PVCs. Personally reviewed    Labs: Basic Metabolic Panel:  Recent Labs Lab 03/04/17 2055 03/05/17 0342 03/05/17 1307 03/05/17 1727 03/06/17 0430  NA 120* 118* 127* 128* 128*  K 3.9 3.4* 3.6 3.8 4.1  CL 81* 80* 87* 88* 87*  CO2 29 28 33* 33* 32  GLUCOSE 237*  163* 150* 160* 162*  BUN 33* 29* 28* 29* 26*  CREATININE 1.54* 1.36* 1.21 1.29* 1.28*  CALCIUM 8.5* 8.4* 8.6* 8.7* 8.7*  MG  --   --   --   --  2.2    Liver Function Tests: No results for input(s): AST, ALT, ALKPHOS, BILITOT, PROT, ALBUMIN in the last 168 hours. No results for input(s): LIPASE, AMYLASE in the last 168 hours. No results for input(s): AMMONIA in the last 168 hours.  CBC:  Recent Labs Lab 03/02/17 1105 03/04/17 2055 03/05/17 0342 03/06/17 0430  WBC 7.2 7.0 6.3 5.6  HGB 8.7* 8.2* 8.0* 8.1*  HCT 28.3* 26.9* 26.4* 27.3*  MCV 75.9* 75.4* 75.4* 78.0  PLT 218 221 199 200    INR:  Recent Labs Lab 03/02/17 1105 03/04/17 2055 03/05/17 0342 03/06/17 0430  INR 2.77 2.15 2.13 1.72    Other results:    Imaging: Dg Chest Portable 1 View  Result Date: 03/04/2017 CLINICAL DATA:  Shortness of Breath EXAM: PORTABLE CHEST 1 VIEW COMPARISON:  01/23/2017 FINDINGS: Cardiac shadow is enlarged. LVAD is again seen. Defibrillator is noted. Tunneled right jugular PICC line is seen in satisfactory position. No focal infiltrate or sizable effusion is seen. IMPRESSION: Tubes and lines as described.  No acute abnormality is noted. Electronically Signed   By: Inez Catalina  M.D.   On: 03/04/2017 21:23     Medications:     Scheduled Medications: . amiodarone  200 mg Oral Daily  . aspirin EC  81 mg Oral Daily  . Chlorhexidine Gluconate Cloth  6 each Topical Daily  . citalopram  10 mg Oral Daily  . digoxin  0.25 mg Oral Daily  . docusate sodium  200 mg Oral QPM  . furosemide  80 mg Intravenous BID  . gabapentin  200 mg Oral BID  . insulin aspart  0-15 Units Subcutaneous TID WC  . insulin aspart  0-5 Units Subcutaneous QHS  . insulin aspart  10 Units Subcutaneous TID WC  . insulin glargine  20 Units Subcutaneous Q2200  . magnesium oxide  400 mg Oral Daily  . pantoprazole  40 mg Oral Daily  . potassium chloride  40 mEq Oral BID  . sildenafil  40 mg Oral TID  . sodium  chloride flush  10-40 mL Intracatheter Q12H  . spironolactone  25 mg Oral Daily  . warfarin  20 mg Oral ONCE-1800  . Warfarin - Pharmacist Dosing Inpatient   Does not apply q1800    Infusions: . DOBUTamine 5 mcg/kg/min (03/06/17 1200)    PRN Medications: acetaminophen, ondansetron (ZOFRAN) IV, sodium chloride flush, sodium chloride flush, traMADol   Assessment/Plan:   1. 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 R>>L  - Echo 08/2016 with EF of 15%. 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 Now on dobutamine - Continues to struggle with RV failure and R-sided volume overload/gut edema.CVP very high. - Continue IV lasix. Continue dobutamine for RV support. Co-ox remains marginal - With severity of RV failure will likely need IV lasix Mon/Friday at home - Continue sildenafil. - VAD parameters stable.  - He was seen at [facility] - Dr. Hoyt Koch for transplant recently. Doubt he will be transplant candidate due mainly to social issues - LDH 208 - INR 1.72 Liver function improved with decongestion. Will increase coumadin. Dosing per PharmD 2. Hyponatremia - Suspect due to diuretics and heavy water/ice intake - Got 2 doses of tolvaptan and small amount of NS. Na now 128 - Fluid restrict 3. CAD s/p CABG - No ischemic symptoms - On ASA 4. Nausea/vomiting - suspect due to RV failure and gut edema. Improved with diuresis. 5. DM  - Improved control. Continue current insulin regimen. D/w PharmD - Sliding scale ordered.  6. Iron deficiency anemia - Iron stores low. Got Feraheme 6/2 7. Hypokalemia - Improved with supplementation  I reviewed the LVAD parameters from today, and compared the results to the patient's prior recorded data.  No programming changes were made.  The LVAD is functioning within specified parameters.  The patient performs LVAD self-test daily.  LVAD interrogation was negative for any significant power changes, alarms or PI events/speed drops.  LVAD equipment  check completed and is in good working order.  Back-up equipment present.   LVAD education done on emergency procedures and precautions and reviewed exit site care.  Length of Stay: 1  Kempton Milne MD 03/06/2017, 12:19 PM  VAD Team --- VAD ISSUES ONLY--- Pager [phone] (7am - 7am)  Advanced Heart Failure Team  Pager [phone] (M-F; 7a - 4p)  Please contact [facility] for night-coverage after hours (4p -7a ) and weekends on [email or web address]

## 2017-03-06 NOTE — Progress Notes (Signed)
Cardiology called regarding high MAPs, order to continue monitoring

## 2017-03-07 ENCOUNTER — Inpatient Hospital Stay (HOSPITAL_COMMUNITY): Payer: Medicare HMO

## 2017-03-07 ENCOUNTER — Other Ambulatory Visit (HOSPITAL_COMMUNITY): Payer: Medicare HMO

## 2017-03-07 DIAGNOSIS — I50810 Right heart failure, unspecified: Secondary | ICD-10-CM

## 2017-03-07 DIAGNOSIS — Z95811 Presence of heart assist device: Secondary | ICD-10-CM

## 2017-03-07 LAB — GLUCOSE, CAPILLARY
GLUCOSE-CAPILLARY: 171 mg/dL — AB (ref 65–99)
GLUCOSE-CAPILLARY: 172 mg/dL — AB (ref 65–99)
Glucose-Capillary: 160 mg/dL — ABNORMAL HIGH (ref 65–99)
Glucose-Capillary: 115 mg/dL — ABNORMAL HIGH (ref 65–99)
Glucose-Capillary: 154 mg/dL — ABNORMAL HIGH (ref 65–99)

## 2017-03-07 LAB — COOXEMETRY PANEL
CARBOXYHEMOGLOBIN: 1.6 % — AB (ref 0.5–1.5)
Methemoglobin: 1.6 % — ABNORMAL HIGH (ref 0.0–1.5)
O2 SAT: 54.2 %
TOTAL HEMOGLOBIN: 8.4 g/dL — AB (ref 12.0–16.0)

## 2017-03-07 LAB — CBC
HEMATOCRIT: 27.4 % — AB (ref 39.0–52.0)
HEMOGLOBIN: 8 g/dL — AB (ref 13.0–17.0)
MCH: 23 pg — ABNORMAL LOW (ref 26.0–34.0)
MCHC: 29.2 g/dL — AB (ref 30.0–36.0)
MCV: 78.7 fL (ref 78.0–100.0)
Platelets: 205 10*3/uL (ref 150–400)
RBC: 3.48 MIL/uL — ABNORMAL LOW (ref 4.22–5.81)
RDW: 18.2 % — ABNORMAL HIGH (ref 11.5–15.5)
WBC: 5.8 10*3/uL (ref 4.0–10.5)

## 2017-03-07 LAB — BASIC METABOLIC PANEL
ANION GAP: 6 (ref 5–15)
BUN: 22 mg/dL — ABNORMAL HIGH (ref 6–20)
CALCIUM: 8.5 mg/dL — AB (ref 8.9–10.3)
CHLORIDE: 90 mmol/L — AB (ref 101–111)
CO2: 32 mmol/L (ref 22–32)
CREATININE: 1.09 mg/dL (ref 0.61–1.24)
GFR calc non Af Amer: >60 mL/min (ref >60)
Glucose, Bld: 168 mg/dL — ABNORMAL HIGH (ref 65–99)
Potassium: 4.2 mmol/L (ref 3.5–5.1)
SODIUM: 128 mmol/L — AB (ref 135–145)

## 2017-03-07 LAB — HEMOGLOBIN A1C
Hgb A1c MFr Bld: 7.1 % — ABNORMAL HIGH (ref 4.8–5.6)
MEAN PLASMA GLUCOSE: 157 mg/dL

## 2017-03-07 LAB — PROTIME-INR
INR: 2.06
PROTHROMBIN TIME: 23.5 s — AB (ref 11.4–15.2)

## 2017-03-07 LAB — LACTATE DEHYDROGENASE: LDH: 187 U/L (ref 98–192)

## 2017-03-07 MED ORDER — WARFARIN SODIUM 7.5 MG PO TABS
15.0000 mg | ORAL_TABLET | Freq: Once | ORAL | Status: AC
  Administered 2017-03-07: 15 mg via ORAL
  Filled 2017-03-07: qty 2

## 2017-03-07 MED ORDER — METOLAZONE 2.5 MG PO TABS
2.5000 mg | ORAL_TABLET | Freq: Two times a day (BID) | ORAL | Status: DC
  Administered 2017-03-07 (×2): 2.5 mg via ORAL
  Filled 2017-03-07 (×2): qty 1

## 2017-03-07 MED ORDER — DEXTROSE 5 % IV SOLN
120.0000 mg | Freq: Three times a day (TID) | INTRAVENOUS | Status: DC
  Administered 2017-03-07 (×3): 120 mg via INTRAVENOUS
  Filled 2017-03-07 (×5): qty 12

## 2017-03-07 MED ORDER — IOPAMIDOL (ISOVUE-370) INJECTION 76%
INTRAVENOUS | Status: AC
  Administered 2017-03-07: 100 mL via INTRAVENOUS
  Filled 2017-03-07: qty 100

## 2017-03-07 NOTE — Progress Notes (Signed)
HeartMate 2 Rounding Note  Subjective:    Admitted 6/1 with RV failure, N/V and hyponatremia.   Feeling OK.  Walked halls earlier without SOB. Abd remains distended.   CVP remains 21-22. Negative 900 cc with 3 L of urine output. Weight shows up 1 lb.  Sodium 120->118-> 128 -> 128  LVAD INTERROGATION:  HeartMate II LVAD:  Flow 5.3 liters/min, speed 5900, power 5, PI 2.8.  Occasional PI event.  Objective:    Vital Signs:   Temp:  [97.4 F (36.3 C)-98.2 F (36.8 C)] 97.6 F (36.4 C) (06/04 0820) Pulse Rate:  [74-81] 74 (06/04 0914) Resp:  [12-28] 17 (06/04 0400) BP: (126)/(79) 126/79 (06/03 1518) SpO2:  [91 %-96 %] 91 % (06/04 0400) Weight:  [232 lb 5.8 oz (105.4 kg)] 232 lb 5.8 oz (105.4 kg) (06/04 0500) Last BM Date: 03/05/17 Mean arterial Pressure 80s  Intake/Output:   Intake/Output Summary (Last 24 hours) at 03/07/17 0919 Last data filed at 03/07/17 0700  Gross per 24 hour  Intake             1936 ml  Output             2625 ml  Net             -689 ml     Physical Exam: GENERAL: Sitting on edge of bed. NAD.  HEENT: Normal. NECK: Supple, JVP elevated to jaw. Carotids OK.  CARDIAC:  Mechanical heart sounds with LVAD hum present. Normal HM3 sounds. LUNGS:  CTAB, normal effort.  ABDOMEN:  NT, ND, no HSM. No bruits or masses. +BS  LVAD exit site: Well-healed and incorporated. Dressing dry and intact. No erythema or drainage. Stabilization device present and accurately applied. Driveline dressing changed daily per sterile technique. EXTREMITIES:  Warm and dry. No cyanosis, clubbing, rash, or edema.  NEUROLOGIC:  Alert & oriented x 3. Cranial nerves grossly intact. Moves all 4 extremities w/o difficulty. Affect pleasant    Telemetry: Personally review, NSR 80-90s. Occasional PVCs.   Labs: Basic Metabolic Panel:  Recent Labs Lab 03/05/17 0342 03/05/17 1307 03/05/17 1727 03/06/17 0430 03/07/17 0401  NA 118* 127* 128* 128* 128*  K 3.4* 3.6 3.8 4.1 4.2  CL  80* 87* 88* 87* 90*  CO2 28 33* 33* 32 32  GLUCOSE 163* 150* 160* 162* 168*  BUN 29* 28* 29* 26* 22*  CREATININE 1.36* 1.21 1.29* 1.28* 1.09  CALCIUM 8.4* 8.6* 8.7* 8.7* 8.5*  MG  --   --   --  2.2  --     Liver Function Tests: No results for input(s): AST, ALT, ALKPHOS, BILITOT, PROT, ALBUMIN in the last 168 hours. No results for input(s): LIPASE, AMYLASE in the last 168 hours. No results for input(s): AMMONIA in the last 168 hours.  CBC:  Recent Labs Lab 03/02/17 1105 03/04/17 2055 03/05/17 0342 03/06/17 0430 03/07/17 0401  WBC 7.2 7.0 6.3 5.6 5.8  HGB 8.7* 8.2* 8.0* 8.1* 8.0*  HCT 28.3* 26.9* 26.4* 27.3* 27.4*  MCV 75.9* 75.4* 75.4* 78.0 78.7  PLT 218 221 199 200 205    INR:  Recent Labs Lab 03/02/17 1105 03/04/17 2055 03/05/17 0342 03/06/17 0430 03/07/17 0401  INR 2.77 2.15 2.13 1.72 2.06    Other results:    Imaging: No results found.   Medications:     Scheduled Medications: . amiodarone  200 mg Oral Daily  . aspirin EC  81 mg Oral Daily  . Chlorhexidine Gluconate Cloth  6  each Topical Daily  . citalopram  10 mg Oral Daily  . digoxin  0.25 mg Oral Daily  . docusate sodium  200 mg Oral QPM  . gabapentin  200 mg Oral BID  . insulin aspart  0-15 Units Subcutaneous TID WC  . insulin aspart  0-5 Units Subcutaneous QHS  . insulin aspart  10 Units Subcutaneous TID WC  . insulin glargine  20 Units Subcutaneous Q2200  . magnesium oxide  400 mg Oral Daily  . pantoprazole  40 mg Oral Daily  . potassium chloride  40 mEq Oral BID  . sildenafil  40 mg Oral TID  . sodium chloride flush  10-40 mL Intracatheter Q12H  . spironolactone  25 mg Oral Daily  . Warfarin - Pharmacist Dosing Inpatient   Does not apply q1800    Infusions: . DOBUTamine 5 mcg/kg/min (03/06/17 1600)    PRN Medications: acetaminophen, ondansetron (ZOFRAN) IV, sodium chloride flush, sodium chloride flush, traMADol   Assessment/Plan:   1. Acute on chronic biventricular  systolic HF. R>>L  - Echo 08/2016 with EF of 15%. 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 Now on dobutamine - Continues to struggle with RV failure and R-sided volume overload/gut edema.CVP very high. - Increase IV lasix to 120 mg TID today with metolazone 2.5 mg BID. Supp K.  - With severity of RV failure will likely need IV lasix Mon/Friday at home - Continue sildenafil. - VAD parameters stable.  - He was seen at [facility] - Dr. Hoyt Koch for transplant recently. Doubt he will be transplant candidate due mainly to social issues - LDH 187 - INR 2.06. Liver function improved with decongestion. Discussed dosing per PharmD.  - Dr. Prescott Gum has concern for Outflow graft obstruction with recent HM3 recall. Will discuss optimal CT order and direction with Radiology and MD.  2. Hyponatremia - Suspect due to diuretics and heavy water/ice intake - Got 2 doses of tolvaptan and small amount of NS. Na now 128 - Continue free water restriction 3. CAD s/p CABG - No CP.  - On ASA 4. Nausea/vomiting - Improved.  5. DM  - Improved control. Continue current insulin regimen. D/w PharmD - Continue SSI.  6. Iron deficiency anemia - Iron stores low. Got Feraheme 6/2. No change.  7. Hypokalemia - Resolved with supp.   I reviewed the LVAD parameters from today, and compared the results to the patient's prior recorded data.  No programming changes were made.  The LVAD is functioning within specified parameters.  The patient performs LVAD self-test daily.  LVAD interrogation was negative for any significant power changes, alarms or PI events/speed drops.  LVAD equipment check completed and is in good working order.  Back-up equipment present.   LVAD education done on emergency procedures and precautions and reviewed exit site care.   Length of Stay: 2  Ronald Miller  03/07/2017, 9:19 AM  VAD Team --- VAD ISSUES ONLY--- Pager [phone] (7am - 7am)  Advanced Heart Failure Team  Pager  [phone] (M-F; 7a - 4p)  Please contact [facility] for night-coverage after hours (4p -7a ) and weekends on [email or web address]  Patient seen and examined with the above-signed Advanced Practice Provider and/or Housestaff. I personally reviewed laboratory data, imaging studies and relevant notes. I independently examined the patient and formulated the important aspects of the plan. I have edited the note to reflect any of my changes or salient points. I have personally discussed the plan with the patient  and/or family.  Continues with severe RV failure. CVP ~20. Will increase diuretics. Co-ox marginal. Continue dobutamine. Case d/w Dr. Prescott Gum will plan CT to exclude HM-3 outflow graft obstruction. Watch renal function closely. INR 2.1. Coumadin dosing d/w PharmD at bedside.   Glori Bickers, MD  1:14 PM

## 2017-03-07 NOTE — Progress Notes (Signed)
ANTICOAGULATION CONSULT NOTE - Follow Up Consult  Pharmacy Consult for Coumadin Indication: LVAD and recent DVT  Allergies  Allergen Reactions  . Codeine Nausea And Vomiting    In high doses  . Trazodone And Nefazodone Other (See Comments)    Dizziness with subsequent fall   . Lipitor [Atorvastatin] Nausea Only    Nausea with high doses, tolerates 20mg  dose (08/22/16)  . Tape Itching    Paper tape is ok    Patient Measurements: Height: 5' 9.02" (175.3 cm) Weight: 232 lb 5.8 oz (105.4 kg) IBW/kg (Calculated) : 70.74  Vital Signs: Temp: 97.6 F (36.4 C) (06/04 0820) Temp Source: Oral (06/04 0820) Pulse Rate: 74 (06/04 0914)  Labs:  Recent Labs  03/05/17 0342  03/05/17 1727 03/06/17 0430 03/07/17 0401  HGB 8.0*  --   --  8.1* 8.0*  HCT 26.4*  --   --  27.3* 27.4*  PLT 199  --   --  200 205  LABPROT 24.1*  --   --  20.4* 23.5*  INR 2.13  --   --  1.72 2.06  CREATININE 1.36*  < > 1.29* 1.28* 1.09  < > = values in this interval not displayed.  Estimated Creatinine Clearance: 86.2 mL/min (by C-G formula based on SCr of 1.09 mg/dL).  Assessment: 61yom on coumadin pta for LVAD and recent RUE DVT (01/26/2017). INR therapeutic on admission 2.15 and home dose continued. INR fell to 1.7 yesterday and 20mg  given. INR up to 2.06 today. CBC, LDH stable. No bleeding. Continues on amiodarone as pta.  Home dose: 15mg  daily except 10mg  on Tuesdays  Goal of Therapy:  2-2.5 Monitor platelets by anticoagulation protocol: Yes   Plan:  1) Coumadin 15mg  tonight 2) Daily INR, CBC, LDH  Deboraha Sprang 03/07/2017,10:16 AM

## 2017-03-07 NOTE — Progress Notes (Signed)
LVAD Coordinator Rounding Note:  Admitted 03/04/2017 due to RV failure, nausea, vomiting, and hyponatremia.   HeartMate 3 LVAD implated on 09/09/2016 by Dr. Prescott Gum.   Vital signs: HR: 79 Doppler Pressure:86 Automatic BP: not assessed this AM O2 Sat: 91 on RA Wt:232.5     LVAD interrogation reveals:  Speed:5900 Flow: 5.3 Power:  5 PI:2.8  Alarms: none Events:  3-5 PI events daily Fixed speed: 5900 Low speed limit: 5600  Drive Line:Weekly. Please change today using weekly dressing kit.  Labs:  LDH trend:208>187  INR trend: 2.13>1.72>20.6  Anticoagulation Plan: -INR Goal: 2-2.5 -ASA Dose: 81 mg  Adverse Events on VAD: -11/30/16> Milrinone gtt at discharge -01/27/17> dobutamine gtt at discharge  Plan/Recommendations:   1. Please change dressing today using a weekly kit. 2. Plan for CTA of chest to assess for outflow graft obstruction.  Balinda Quails RN, VAD Coordinator 24/7 pager [phone]

## 2017-03-08 ENCOUNTER — Encounter (HOSPITAL_COMMUNITY): Payer: Self-pay

## 2017-03-08 LAB — BASIC METABOLIC PANEL
ANION GAP: 10 (ref 5–15)
BUN: 26 mg/dL — AB (ref 6–20)
CALCIUM: 8.8 mg/dL — AB (ref 8.9–10.3)
CO2: 36 mmol/L — ABNORMAL HIGH (ref 22–32)
Chloride: 85 mmol/L — ABNORMAL LOW (ref 101–111)
Creatinine, Ser: 1.43 mg/dL — ABNORMAL HIGH (ref 0.61–1.24)
GFR calc Af Amer: >60 mL/min (ref >60)
GFR, EST NON AFRICAN AMERICAN: 52 mL/min — AB (ref >60)
GLUCOSE: 135 mg/dL — AB (ref 65–99)
Potassium: 3.8 mmol/L (ref 3.5–5.1)
Sodium: 131 mmol/L — ABNORMAL LOW (ref 135–145)

## 2017-03-08 LAB — CBC
HEMATOCRIT: 28.7 % — AB (ref 39.0–52.0)
HEMOGLOBIN: 8.4 g/dL — AB (ref 13.0–17.0)
MCH: 23.3 pg — AB (ref 26.0–34.0)
MCHC: 29.3 g/dL — AB (ref 30.0–36.0)
MCV: 79.7 fL (ref 78.0–100.0)
Platelets: 237 10*3/uL (ref 150–400)
RBC: 3.6 MIL/uL — ABNORMAL LOW (ref 4.22–5.81)
RDW: 18.6 % — AB (ref 11.5–15.5)
WBC: 7.4 10*3/uL (ref 4.0–10.5)

## 2017-03-08 LAB — PROTIME-INR
INR: 2.34
Prothrombin Time: 26 seconds — ABNORMAL HIGH (ref 11.4–15.2)

## 2017-03-08 LAB — COOXEMETRY PANEL
CARBOXYHEMOGLOBIN: 1.7 % — AB (ref 0.5–1.5)
Methemoglobin: 1.4 % (ref 0.0–1.5)
O2 Saturation: 56.9 %
Total hemoglobin: 7.9 g/dL — ABNORMAL LOW (ref 12.0–16.0)

## 2017-03-08 LAB — GLUCOSE, CAPILLARY
GLUCOSE-CAPILLARY: 158 mg/dL — AB (ref 65–99)
GLUCOSE-CAPILLARY: 226 mg/dL — AB (ref 65–99)

## 2017-03-08 LAB — LACTATE DEHYDROGENASE: LDH: 195 U/L — AB (ref 98–192)

## 2017-03-08 MED ORDER — METOLAZONE 2.5 MG PO TABS: ORAL_TABLET | ORAL | 6 refills | Status: DC

## 2017-03-08 MED ORDER — POTASSIUM CHLORIDE ER 20 MEQ PO TBCR: 40.0000 meq | EXTENDED_RELEASE_TABLET | Freq: Two times a day (BID) | ORAL | 6 refills | Status: DC

## 2017-03-08 MED ORDER — METOLAZONE 2.5 MG PO TABS
2.5000 mg | ORAL_TABLET | Freq: Once | ORAL | Status: AC
  Administered 2017-03-08: 2.5 mg via ORAL
  Filled 2017-03-08: qty 1

## 2017-03-08 MED ORDER — POTASSIUM CHLORIDE CRYS ER 20 MEQ PO TBCR
40.0000 meq | EXTENDED_RELEASE_TABLET | Freq: Once | ORAL | Status: AC
  Administered 2017-03-08: 40 meq via ORAL
  Filled 2017-03-08: qty 2

## 2017-03-08 MED ORDER — FUROSEMIDE 8 MG/ML PO SOLN: ORAL | 12 refills | Status: DC

## 2017-03-08 MED ORDER — FUROSEMIDE 10 MG/ML IJ SOLN
120.0000 mg | Freq: Once | INTRAVENOUS | Status: AC
  Administered 2017-03-08: 120 mg via INTRAVENOUS
  Filled 2017-03-08: qty 12

## 2017-03-08 NOTE — Progress Notes (Signed)
  Subjective: CTA chest reviewed- outflow graft widely patent w/o kink Inflow cannula directed at mitral valve annulus No pericardial effusion No pleural effusion or lung edema Mild atelectasis L base Objective: Vital signs in last 24 hours: Temp:  [98 F (36.7 C)-98.7 F (37.1 C)] 98.6 F (37 C) (06/05 0837) Pulse Rate:  [64-80] 80 (06/05 0400) Cardiac Rhythm: Normal sinus rhythm (06/05 0400) Resp:  [14-31] 16 (06/05 0400) BP: (95-103)/(60-82) 103/81 (06/05 0400) SpO2:  [89 %-96 %] 90 % (06/05 0400) Weight:  [223 lb 12.3 oz (101.5 kg)] 223 lb 12.3 oz (101.5 kg) (06/05 0500)  Hemodynamic parameters for last 24 hours: CVP:  [17 mmHg-22 mmHg] 17 mmHg  Intake/Output from previous day: 06/04 0701 - 06/05 0700 In: 2073 [P.O.:1695; I.V.:192; IV Piggyback:186] Out: 5852 [Urine:7550] Intake/Output this shift: Total I/O In: 8 [I.V.:8] Out: -     Lab Results:  Recent Labs  03/07/17 0401 03/08/17 0325  WBC 5.8 7.4  HGB 8.0* 8.4*  HCT 27.4* 28.7*  PLT 205 237   BMET:  Recent Labs  03/07/17 0401 03/08/17 0325  NA 128* 131*  K 4.2 3.8  CL 90* 85*  CO2 32 36*  GLUCOSE 168* 135*  BUN 22* 26*  CREATININE 1.09 1.43*  CALCIUM 8.5* 8.8*    PT/INR:  Recent Labs  03/08/17 0325  LABPROT 26.0*  INR 2.34   ABG    Component Value Date/Time   PHART 7.390 09/11/2016 1338   HCO3 32.8 (H) 02/11/2017 0825   TCO2 34 02/11/2017 0825   ACIDBASEDEF 3.0 (H) 09/11/2016 0423   O2SAT 56.9 03/08/2017 0350   CBG (last 3)   Recent Labs  03/07/17 1210 03/07/17 1619 03/07/17 2142  GLUCAP 172* 115* 154*    Assessment/Plan: S/P LVAD with RV failure, VAD functioning Cont pharmacologic support of RV   LOS: 3 days    Ronald Miller 03/08/2017

## 2017-03-08 NOTE — Progress Notes (Addendum)
HeartMate 2 Rounding Note  Subjective:    Admitted 6/1 with RV failure, N/V and hyponatremia.   CTA 03/07/17 to r/u pump outflow obstruction in HM3. Dr. Prescott Gum reviewed.  Looks OK.   Feeling better this morning. Slept much better. Denies SOB or CP. Abdomen still feels distended.   CVP 10 on my personal check, much improved from ~20 yesterday. ? Accuracy. Negative 5.4 L with 7.5 L of UO. Weight shows down 9 lbs.   Creatinine 1.0 -> 1.4  Sodium 120->118-> 128 -> 128 -> 131  LVAD INTERROGATION:  HeartMate II LVAD:  Flow 5.5 liters/min, speed 5900, power 5, PI 3.4.  Occasional PI event.   Objective:    Vital Signs:   Temp:  [97.6 F (36.4 C)-98.7 F (37.1 C)] 98 F (36.7 C) (06/05 0400) Pulse Rate:  [64-80] 80 (06/05 0400) Resp:  [14-31] 16 (06/05 0400) BP: (95-103)/(60-82) 103/81 (06/05 0400) SpO2:  [89 %-96 %] 90 % (06/05 0400) Weight:  [223 lb 12.3 oz (101.5 kg)] 223 lb 12.3 oz (101.5 kg) (06/05 0500) Last BM Date: 03/04/17   Mean arterial Pressure 80s   Intake/Output:   Intake/Output Summary (Last 24 hours) at 03/08/17 0758 Last data filed at 03/08/17 0700  Gross per 24 hour  Intake             2073 ml  Output             7550 ml  Net            -5477 ml    Physical Exam: GENERAL: Lying in recliner.  NAD.  HEENT: Normal.  NECK: Supple, JVP at least 10 cm +. Carotids OK.  CARDIAC:  Mechanical heart sounds with LVAD hum present.  LUNGS:  CTAB, normal effort.  ABDOMEN:  NT, ND, no HSM. No bruits or masses. +BS  LVAD exit site: Well-healed and incorporated. Dressing dry and intact. No erythema or drainage. Stabilization device present and accurately applied. Driveline dressing changed daily per sterile technique. EXTREMITIES:  Warm and dry. No cyanosis, clubbing, rash, or edema.  NEUROLOGIC:  Alert & oriented x 3. Cranial nerves grossly intact. Moves all 4 extremities w/o difficulty. Affect pleasant      Telemetry: Personally reviewed, NSR 70s. Occasional  PVCs.   Labs: Basic Metabolic Panel:  Recent Labs Lab 03/05/17 1307 03/05/17 1727 03/06/17 0430 03/07/17 0401 03/08/17 0325  NA 127* 128* 128* 128* 131*  K 3.6 3.8 4.1 4.2 3.8  CL 87* 88* 87* 90* 85*  CO2 33* 33* 32 32 36*  GLUCOSE 150* 160* 162* 168* 135*  BUN 28* 29* 26* 22* 26*  CREATININE 1.21 1.29* 1.28* 1.09 1.43*  CALCIUM 8.6* 8.7* 8.7* 8.5* 8.8*  MG  --   --  2.2  --   --     Liver Function Tests: No results for input(s): AST, ALT, ALKPHOS, BILITOT, PROT, ALBUMIN in the last 168 hours. No results for input(s): LIPASE, AMYLASE in the last 168 hours. No results for input(s): AMMONIA in the last 168 hours.  CBC:  Recent Labs Lab 03/04/17 2055 03/05/17 0342 03/06/17 0430 03/07/17 0401 03/08/17 0325  WBC 7.0 6.3 5.6 5.8 7.4  HGB 8.2* 8.0* 8.1* 8.0* 8.4*  HCT 26.9* 26.4* 27.3* 27.4* 28.7*  MCV 75.4* 75.4* 78.0 78.7 79.7  PLT 221 199 200 205 237    INR:  Recent Labs Lab 03/04/17 2055 03/05/17 0342 03/06/17 0430 03/07/17 0401 03/08/17 0325  INR 2.15 2.13 1.72 2.06 2.34  Other results:    Imaging: No results found.   Medications:     Scheduled Medications: . amiodarone  200 mg Oral Daily  . aspirin EC  81 mg Oral Daily  . Chlorhexidine Gluconate Cloth  6 each Topical Daily  . citalopram  10 mg Oral Daily  . digoxin  0.25 mg Oral Daily  . docusate sodium  200 mg Oral QPM  . gabapentin  200 mg Oral BID  . insulin aspart  0-15 Units Subcutaneous TID WC  . insulin aspart  0-5 Units Subcutaneous QHS  . insulin aspart  10 Units Subcutaneous TID WC  . insulin glargine  20 Units Subcutaneous Q2200  . magnesium oxide  400 mg Oral Daily  . metolazone  2.5 mg Oral BID  . pantoprazole  40 mg Oral Daily  . potassium chloride  40 mEq Oral BID  . sildenafil  40 mg Oral TID  . sodium chloride flush  10-40 mL Intracatheter Q12H  . spironolactone  25 mg Oral Daily  . Warfarin - Pharmacist Dosing Inpatient   Does not apply q1800    Infusions: .  DOBUTamine 5 mcg/kg/min (03/07/17 2119)  . furosemide Stopped (03/07/17 2212)    PRN Medications: acetaminophen, ondansetron (ZOFRAN) IV, sodium chloride flush, sodium chloride flush, traMADol   Assessment/Plan:   1. 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 R>>L  - Echo 08/2016 with EF of 15%. 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 Now on dobutamine - Continues to struggle with RV failure and R-sided volume overload/gut edema.CVP very high. - Continue IV lasix to 120 mg this am at least. Hold metolazone with mild AKI, but may need to give.  Will discuss with MD. Supp K prn.  - With severity of RV failure will likely need IV lasix Mon/Friday at home - Continue sildenafil. - VAD parameters stable. .  - He was seen at [facility] - Dr. Hoyt Koch for transplant recently. Doubt he will be transplant candidate due mainly to social issues - LDH 195 - INR 2.34. Dosing per pharm.  - CTA Chest 03/07/17 negative for outflow graft obstruction.  2. Hyponatremia - Suspect due to diuretics and heavy water/ice intake - Got 2 doses of tolvaptan and small amount of NS. Na now 128 - Improving. Continue free water restriction.  3. CAD s/p CABG - No CP.  - Cont ASA 4. Nausea/vomiting - Resolved with diuresis.  5. DM  - Improved control. Continue current insulin regimen. D/w PharmD - Continue SSI. No change.  6. Iron deficiency anemia - Iron stores low. Got Feraheme 6/2. No change.  7. Hypokalemia - Stable at 3.8. Continue to follow.   Volume status improved but remains mildly overloaded at least.  CVP down further than expected.  Suspect still > 10.  Continue IV lasix at least this am and will discuss dosing with MD.   I reviewed the LVAD parameters from today, and compared the results to the patient's prior recorded data.  No programming changes were made.  The LVAD is functioning within specified parameters.  The patient performs LVAD self-test daily.  LVAD interrogation was negative for  any significant power changes, alarms or PI events/speed drops.  LVAD equipment check completed and is in good working order.  Back-up equipment present.   LVAD education done on emergency procedures and precautions and reviewed exit site care.  Length of Stay: 3  Annamaria Helling  03/08/2017, 7:58 AM  VAD Team --- VAD ISSUES ONLY--- Pager  [phone] (7am - 7am)  Advanced Heart Failure Team  Pager [phone] (M-F; 7a - 4p)  Please contact [facility] for night-coverage after hours (4p -7a ) and weekends on [email or web address]   Patient seen and examined with the above-signed Advanced Practice Provider and/or Housestaff. I personally reviewed laboratory data, imaging studies and relevant notes. I independently examined the patient and formulated the important aspects of the plan. I have edited the note to reflect any of my changes or salient points. I have personally discussed the plan with the patient and/or family.  Much improved CVP down to 10. Feeling better. CT chest reviewed personally with Radiology - no outflow graft kink. Suspect symptoms related to RV failure. Can go home after IV lasix today. Long talk about need for fluid and ice restriction. Will give IV lasix 80 with metolazone 2.5 every Monday and Friday. Hyponatremia improved. VAD parameters stable.   Glori Bickers, MD  9:16 AM

## 2017-03-08 NOTE — Care Management Note (Signed)
Case Management Note Marvetta Gibbons RN, BSN Unit 2W-Case Manager-- [place] coverage [phone]  Patient Details  Name: Ronald Miller. MRN: [id number] Date of Birth: 01/05/1956  Subjective/Objective:  Pt admitted with acute on chronic HF                  Action/Plan: PTA pt lived at home-per pt was active with [facility] for [facility] services and home IV Dobutamine- orders to resume services and IV dobutamine have been placed- Pam with [facility] aware and to be were around 3pm today to hook up to home Dobutamine- have also spoken with Santiago Glad at [facility] regarding resumption of services. Pt will d/c home later today after [facility] has seen pt.   Expected Discharge Date:  03/08/17               Expected Discharge Plan:  [place]  In-House Referral:     Discharge planning Services  CM Consult  Post Acute Care Choice:  Resumption of Svcs/PTA Provider, Home Health Choice offered to:  Patient  DME Arranged:  N/A DME Agency:  NA  HH Arranged:  RN, Disease Management [place] Agency:  [place]  Status of Service:  Completed, signed off  If discussed at [place] of Stay Meetings, dates discussed:   Discharge Disposition: home with home health services   Additional Comments:  Dawayne Patricia, RN 03/08/2017, 11:29 AM

## 2017-03-08 NOTE — Discharge Summary (Signed)
Advanced Heart Failure Team  Discharge Summary   Patient ID: Ronald Miller. MRN: [id number], DOB/AGE: 60-04-1956 60 y.o. Admit date: 03/04/2017 D/C date:     03/08/2017   Primary Discharge Diagnoses:  1. 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 R > L.  2. Hyponatremia 3. CAD s/p CABG 4. Nausea/vomiting 5. DM2 6. IDA 7. Hypokalemia  Hospital Course:   Mr. Sitar is a 61 year old male with a past medical history of chronic systolic HF s/p Medtr[employer] ICD in 2014, CAD s/p CABG in 2000, HTN, history of cocaine abuse, tobacco abuse, depression, and COPD. He underwent HM 3 placement on 09/09/16. His post operative course was complicated by RV failure, volume overload, L pleural effusion and PAF  Admitted to [facility] 03/04/17 with R>L CHF and marked volume overload in setting of dietary/fluid restriction non-compliance. Noted to gain 7 lbs in 1 day with worsening SOB and abdominal swelling. Na down to 120 on BMET. Brought in for IV diuresis. Pt also noted to have N/V that improved with diuresis.   With recurrent RHF and volume overload CTA chest performed to r/o pump outflow obstruction. CTA 03/07/17 chest with no outflow obstruction noted.   Overall patient diuresed 8.6L and down 11 lbs from admit on IV lasix up to 120 mg TID with 2.5 mg metolazone BID. K supped as needed.   Pt also received IV Feraheme 03/05/17 for Iron Deficiency anemia.   On am of 03/08/17 CVP down to 10. Given morning dose of IV lasix and thought stable for home with adjustment to home diuretic regimen including IV lasix every Monday and Friday with 2.5 mg metolazone. Will have him take extra 20 meq of potassium on these days.   Pts INR stable and will continue home regimen with dose of 10 mg this evening.  Will have INR checked in clinic next week at follow up as below.   LVAD Interrogation: HeartMate II LVAD:  Flow 5.5 liters/min, speed 5900, power 5, PI 3.4.  Occasional PI event.   Discharge Weight: 223 lbs Discharge Vitals:  Blood pressure (!) 86/65, pulse 79, temperature 98.6 F (37 C), temperature source Oral, resp. rate 16, height 5' 9.02" (1.753 m), weight 223 lb 12.3 oz (101.5 kg), SpO2 97 %.  Labs: Lab Results  Component Value Date   WBC 7.4 03/08/2017   HGB 8.4 (L) 03/08/2017   HCT 28.7 (L) 03/08/2017   MCV 79.7 03/08/2017   PLT 237 03/08/2017     Recent Labs Lab 03/08/17 0325  NA 131*  K 3.8  CL 85*  CO2 36*  BUN 26*  CREATININE 1.43*  CALCIUM 8.8*  GLUCOSE 135*   Lab Results  Component Value Date   CHOL 153 08/15/2013   HDL 37 (L) 08/15/2013   LDLCALC 89 08/15/2013   TRIG 133 08/15/2013   BNP (last 3 results)  Recent Labs  10/13/16 1207 11/30/16 1158 01/21/17 1230  BNP 321.5* 565.5* 649.9*    ProBNP (last 3 results) No results for input(s): PROBNP in the last 8760 hours.   Diagnostic Studies/Procedures   Ct Angio Chest Aorta W/cm &/or Wo/cm  Result Date: 03/08/2017 CLINICAL DATA:  61 year old male with history of left ventricular assist device. Evaluate for outflow graft obstruction. EXAM: CT ANGIOGRAPHY CHEST WITH CONTRAST TECHNIQUE: Multidetector CT imaging of the chest was performed using the standard protocol during bolus administration of intravenous contrast. Multiplanar CT image reconstructions and MIPs were obtained to evaluate the vascular anatomy. CONTRAST:  100 mL of  Isovue 370. COMPARISON:  Chest CT 01/21/2017. FINDINGS: LVAD: Left ventricular assist device in position in the left ventricular apex. Inflow cannula is nicely oriented along the long axis of the left ventricle, without evidence of obstruction. Outflow cannula also appears widely patent, without any hemodynamically significant stenosis identified. The very proximal aspect of the outflow cannula is partially obscured by beam hardening artifact, but the remainder of the outflow cannula is well visualized, and there is no evidence of indwelling thrombus. There is approximately 70 degrees of angulation of the  outflow cannula as it shifts from a horizontal trajectory to a more vertical trajectory in the epigastric region before extending to the anastomosis on the distal ascending thoracic aorta, however, there is no significant narrowing of the lumen at this site of angulation. No other significant angulation of the graft is identified. The distal aspect of the graft shortly before the anastomosis measures 16.7 x 13.9 mm. At the anastomosis with the distal ascending thoracic aorta, there is mild narrowing to 13.6 x 12.2 mm. The drive line extends from the device into the peritoneal cavity in the epigastric region, and ultimately into the subcutaneous soft tissues in the left side of the abdomen. The entirety of the drive line is incompletely visualized. Along the visualized portions of the drive line there is no unexpected fluid collection. Additionally, no unexpected fluid collection is noted adjacent to the left ventricular apex around the device. Cardiovascular: Heart size is mildly enlarged with dilatation of the right atrium and biventricular dilatation. There is no significant pericardial fluid, thickening or pericardial calcification. There is aortic atherosclerosis, as well as atherosclerosis of the great vessels of the mediastinum and the coronary arteries, including calcified atherosclerotic plaque in the left main, left anterior descending, left circumflex and right coronary arteries. Status post median sternotomy for CABG including LIMA to the LAD. Left-sided biventricular pacemaker/AICD with lead tips terminating in the right atrial appendage, near the right ventricular apex, and overlying the lateral wall the left ventricle via the coronary sinus and coronary veins. Pulmonic trunk is dilated measuring 4.1 cm in diameter. Mediastinum/Nodes: No pathologically enlarged mediastinal or hilar lymph nodes. Esophagus is unremarkable in appearance. No axillary lymphadenopathy. Lungs/Pleura: Linear scarring in the  lung bases bilaterally. Rounded mass-like opacity in the posterior aspect of the left lower lobe measuring 3.0 x 7.5 cm, very similar to the prior study, most compatible with an area of rounded atelectasis. Small chronic left pleural effusion, similar to the prior examination. No right pleural effusion. No other suspicious appearing pulmonary nodules or masses are noted. No acute consolidative airspace disease. Upper Abdomen: Small amount of perihepatic ascites. Status post cholecystectomy. Liver has a slightly shrunken appearance and nodular contour, suggesting underlying cirrhosis. 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 Musculoskeletal: New compression fracture of superior endplate of T6 with approximately 25% loss of anterior vertebral body height. Review of the MIP images confirms the above findings. IMPRESSION: 1. HeartMate 3 device in position, as detailed above. In particular, the outflow cannula appears to be widely patent, without definite hemodynamically significant stenosis. 2. Mild cardiomegaly. 3. Aortic atherosclerosis, in addition to left main and 3 vessel coronary artery disease. Status post median sternotomy for CABG including LIMA to the LAD. 4. Dilatation of the pulmonic trunk (4.1 cm in diameter), concerning for pulmonary arterial hypertension. 5. Small chronic left pleural effusion with area of rounded atelectasis in the left lower lobe, similar to the prior study. 6. Morphologic changes in the liver suggestive of underlying cirrhosis. This  is associated with dilatation of the portal vein, suggesting portal venous hypertension. 7. Small volume of ascites. These results were called by telephone at the time of interpretation on 03/08/2017 at 8:49 am to Dr. Haroldine Laws, who verbally acknowledged these results. Electronically Signed   By: Vinnie Langton M.D.   On: 03/08/2017 08:52    Discharge Medications   Allergies as of 03/08/2017      Reactions   Codeine Nausea And Vomiting   In high doses    Trazodone And Nefazodone Other (See Comments)   Dizziness with subsequent fall    Lipitor [atorvastatin] Nausea Only   Nausea with high doses, tolerates 20mg  dose (08/22/16)   Tape Itching   Paper tape is ok      Medication List    STOP taking these medications   B-D ULTRAFINE III SHORT PEN 31G X 8 MM Misc Generic drug:  Insulin Pen Needle   losartan 25 MG tablet Commonly known as:  COZAAR     TAKE these medications   acetaminophen 325 MG tablet Commonly known as:  TYLENOL Take 650 mg by mouth daily as needed for moderate pain or headache.   amiodarone 200 MG tablet Commonly known as:  PACERONE Take 1 tablet (200 mg total) by mouth daily.   aspirin 81 MG EC tablet Take 1 tablet (81 mg total) by mouth daily.   citalopram 10 MG tablet Commonly known as:  CELEXA Take 1 tablet (10 mg total) by mouth daily.   digoxin 0.25 MG tablet Commonly known as:  LANOXIN Take 0.25 mg by mouth daily.   DOBUTamine 4-5 MG/ML-% infusion Commonly known as:  DOBUTREX Inject 531 mcg/min into the vein continuous.   docusate sodium 100 MG capsule Commonly known as:  COLACE Take 2 capsules (200 mg total) by mouth daily. What changed:  when to take this   furosemide 8 MG/ML solution Commonly known as:  LASIX [facility] to provide and perform IV lasix 80 mg on Mondays and Fridays to replace morning dose of torsemide.   gabapentin 100 MG capsule Commonly known as:  NEURONTIN Take 2 capsules (200 mg total) by mouth 2 (two) times daily.   insulin aspart 100 UNIT/ML FlexPen Commonly known as:  NOVOLOG FLEXPEN Inject 10 Units into the skin 3 (three) times daily with meals.   LANTUS SOLOSTAR 100 UNIT/ML Solostar Pen Generic drug:  Insulin Glargine Inject 20 Units into the skin daily at 10 pm.   magnesium oxide 400 (241.3 Mg) MG tablet Commonly known as:  MAG-OX Take 1 tablet (400 mg total) by mouth daily.   metolazone 2.5 MG tablet Commonly known as:  ZAROXOLYN 2.5 mg (1 tablet) every  Monday and Friday along with dose of IV lasix.   multivitamin with minerals Tabs tablet Take 1 tablet by mouth daily.   ondansetron 4 MG tablet Commonly known as:  ZOFRAN Take 1 tablet (4 mg total) by mouth every 6 (six) hours as needed for nausea or vomiting.   pantoprazole 40 MG tablet Commonly known as:  PROTONIX Take 1 tablet (40 mg total) by mouth daily.   Potassium Chloride ER 20 MEQ Tbcr Take 40 mEq by mouth 2 (two) times daily. Take extra 20 meq on Monday and Friday. What changed:  See the new instructions.   senna-docusate 8.6-50 MG tablet Commonly known as:  Senokot-S Take 2 tablets by mouth daily. What changed:  when to take this   sildenafil 20 MG tablet Commonly known as:  REVATIO Take 2 tablets (  40 mg total) by mouth 3 (three) times daily.   spironolactone 25 MG tablet Commonly known as:  ALDACTONE TAKE 1 TABLET ONE TIME DAILY   torsemide 20 MG tablet Commonly known as:  DEMADEX Take 3 tablets (60 mg total) by mouth 2 (two) times daily.   traMADol 50 MG tablet Commonly known as:  ULTRAM Take 50 mg by mouth 3 (three) times daily as needed for severe pain.   warfarin 5 MG tablet Commonly known as:  COUMADIN Take 15 mg daily except for Tuesdays, Take 10 mg daily on Tuesdays. What changed:  additional instructions            Durable Medical Equipment        Start     Ordered   03/08/17 1002  Heart failure home health orders  (Heart failure home health orders / Face to face)  Once    Comments:  Heart Failure Follow-up Care:  Verify follow-up appointments per Patient Discharge Instructions. Confirm transportation arranged. Reconcile home medications with discharge medication list. Remove discontinued medications from use. Assist patient/caregiver to manage medications using pill box. Reinforce low sodium food selection Assessments: Vital signs and oxygen saturation at each visit. Assess home environment for safety concerns, caregiver support and  availability of low-sodium foods. Consult Education officer, museum, PT/OT, Dietitian, and CNA based on assessments. Perform comprehensive cardiopulmonary assessment. Notify MD for any change in condition or weight gain of 3 pounds in one day or 5 pounds in one week with symptoms. Daily Weights and Symptom Monitoring: Ensure patient has access to scales. Teach patient/caregiver to weigh daily before breakfast and after voiding using same scale and record.    Teach patient/caregiver to track weight and symptoms and when to notify Provider. Activity: Develop individualized activity plan with patient/caregiver.  [facility] to resume dobutamine at 5 mcg/kg/min.   Pt needs IV lasix 80 mg EVERY MONDAY and Friday along with 2.5 mg metolazone in place of morning torsemide.  Question Answer Comment  Heart Failure Follow-up Care Advanced Heart Failure ([facility]F) Clinic at [phone]   Obtain the following labs Other see comments   Lab frequency Other see comments   Fax lab results to AHF Clinic at [phone]   Diet Low Sodium Heart Healthy   Fluid restrictions: 2000 mL Fluid   Initiate Heart Failure Clinic Diuretic Protocol to be used by [place] only ( to be ordered by Heart Failure Team Providers Only) Yes      03/08/17 1004      Disposition   The patient will be discharged in stable condition to home. Discharge Instructions    Diet - low sodium heart healthy    Complete by:  As directed    Heart Failure patients record your daily weight using the same scale at the same time of day    Complete by:  As directed    Increase activity slowly    Complete by:  As directed    STOP any activity that causes chest pain, shortness of breath, dizziness, sweating, or exessive weakness    Complete by:  As directed      Follow-up Information    [facility] [place] Follow up on 03/16/2017.   Specialty:  Cardiology Why:  at 1000 am for post hospital follow up. Code  for parking is 6002. Contact information: [address] [id number] [place] [phone]            Duration of  Discharge Encounter: Greater than 35 minutes   Signed, Annamaria Helling  03/08/2017, 11:03 AM   Patient seen and examined with the above-signed Advanced Practice Provider and/or Housestaff. I personally reviewed laboratory data, imaging studies and relevant notes. I independently examined the patient and formulated the important aspects of the plan. I have edited the note to reflect any of my changes or salient points. I have personally discussed the plan with the patient and/or family.  Volume status much improved. CT scan without LVAD obstruction. Ok to d/c today. Continue IV dobutamine. Will need home IV lasix 2/x/week.  Glori Bickers, MD  9:45 PM

## 2017-03-08 NOTE — Progress Notes (Signed)
LVAD Coordinator Rounding Note:  Admitted 03/04/2017 due to RV failure, nausea, vomiting, and hyponatremia.   HeartMate 3 LVAD implated on 09/09/2016 by Dr. Prescott Gum.   Vital signs: HR: 79 Doppler Pressure:80 Automatic BP: 86/65 O2 Sat: 97 on 2LNC Wt:232.5>223     LVAD interrogation reveals:  Speed:5900 Flow: 5.5 Power:  5 PI: 3.4 Alarms: none Events:  3-5 PI events daily Fixed speed: 5900 Low speed limit: 5600  Drive Line: Weekly. Next due 6/12  Labs:  LDH trend:208>187>195  INR trend: 2.13>1.72>2.06>2.34  Anticoagulation Plan: -INR Goal: 2-2.5 -ASA Dose: 81 mg  Adverse Events on VAD: -11/30/16> Milrinone gtt at discharge -01/27/17> dobutamine gtt at discharge  Plan/Recommendations:   1.Plan for discharge later today. Appointment made for follow up in clinic next week.  Balinda Quails RN, VAD Coordinator 24/7 pager [phone]

## 2017-03-09 ENCOUNTER — Other Ambulatory Visit (HOSPITAL_COMMUNITY): Payer: Medicare HMO

## 2017-03-10 ENCOUNTER — Other Ambulatory Visit (HOSPITAL_COMMUNITY): Payer: Medicare HMO

## 2017-03-13 ENCOUNTER — Ambulatory Visit (HOSPITAL_BASED_OUTPATIENT_CLINIC_OR_DEPARTMENT_OTHER): Payer: Medicare HMO | Attending: Adult Health

## 2017-03-14 ENCOUNTER — Telehealth (HOSPITAL_COMMUNITY): Payer: Self-pay | Admitting: Psychology

## 2017-03-14 ENCOUNTER — Encounter (HOSPITAL_COMMUNITY): Payer: Self-pay

## 2017-03-14 ENCOUNTER — Ambulatory Visit (HOSPITAL_COMMUNITY): Payer: Self-pay | Admitting: Pharmacist

## 2017-03-14 ENCOUNTER — Encounter (HOSPITAL_COMMUNITY): Payer: Self-pay | Admitting: Psychology

## 2017-03-14 ENCOUNTER — Ambulatory Visit: Payer: Self-pay

## 2017-03-14 ENCOUNTER — Ambulatory Visit (HOSPITAL_COMMUNITY)
Admission: RE | Admit: 2017-03-14 | Discharge: 2017-03-14 | Disposition: A | Discharge status: Home / Self Care | Payer: Medicare HMO | Source: Ambulatory Visit | Attending: Internal Medicine | Admitting: Internal Medicine

## 2017-03-14 ENCOUNTER — Other Ambulatory Visit (HOSPITAL_COMMUNITY): Payer: Medicare HMO

## 2017-03-14 VITALS — BP 112/72 | HR 77 | Resp 16 | Ht 68.0 in | Wt 231.0 lb

## 2017-03-14 DIAGNOSIS — Z9119 Patient's noncompliance with other medical treatment and regimen: Secondary | ICD-10-CM | POA: Diagnosis not present

## 2017-03-14 DIAGNOSIS — I50810 Right heart failure, unspecified: Secondary | ICD-10-CM

## 2017-03-14 DIAGNOSIS — I480 Paroxysmal atrial fibrillation: Secondary | ICD-10-CM | POA: Diagnosis not present

## 2017-03-14 DIAGNOSIS — I509 Heart failure, unspecified: Secondary | ICD-10-CM

## 2017-03-14 DIAGNOSIS — Z794 Long term (current) use of insulin: Secondary | ICD-10-CM | POA: Diagnosis not present

## 2017-03-14 DIAGNOSIS — Z7982 Long term (current) use of aspirin: Secondary | ICD-10-CM | POA: Insufficient documentation

## 2017-03-14 DIAGNOSIS — D649 Anemia, unspecified: Secondary | ICD-10-CM | POA: Diagnosis not present

## 2017-03-14 DIAGNOSIS — I5022 Chronic systolic (congestive) heart failure: Secondary | ICD-10-CM

## 2017-03-14 DIAGNOSIS — Z87891 Personal history of nicotine dependence: Secondary | ICD-10-CM | POA: Diagnosis not present

## 2017-03-14 DIAGNOSIS — I2510 Atherosclerotic heart disease of native coronary artery without angina pectoris: Secondary | ICD-10-CM

## 2017-03-14 DIAGNOSIS — Z950 Presence of cardiac pacemaker: Secondary | ICD-10-CM | POA: Diagnosis not present

## 2017-03-14 DIAGNOSIS — E871 Hypo-osmolality and hyponatremia: Secondary | ICD-10-CM | POA: Diagnosis not present

## 2017-03-14 DIAGNOSIS — J449 Chronic obstructive pulmonary disease, unspecified: Secondary | ICD-10-CM | POA: Diagnosis not present

## 2017-03-14 DIAGNOSIS — Z79899 Other long term (current) drug therapy: Secondary | ICD-10-CM | POA: Insufficient documentation

## 2017-03-14 DIAGNOSIS — F329 Major depressive disorder, single episode, unspecified: Secondary | ICD-10-CM | POA: Insufficient documentation

## 2017-03-14 DIAGNOSIS — D509 Iron deficiency anemia, unspecified: Secondary | ICD-10-CM | POA: Insufficient documentation

## 2017-03-14 DIAGNOSIS — I5082 Biventricular heart failure: Secondary | ICD-10-CM | POA: Insufficient documentation

## 2017-03-14 DIAGNOSIS — Z95811 Presence of heart assist device: Secondary | ICD-10-CM | POA: Diagnosis not present

## 2017-03-14 DIAGNOSIS — E119 Type 2 diabetes mellitus without complications: Secondary | ICD-10-CM | POA: Diagnosis not present

## 2017-03-14 DIAGNOSIS — I82621 Acute embolism and thrombosis of deep veins of right upper extremity: Secondary | ICD-10-CM | POA: Diagnosis not present

## 2017-03-14 DIAGNOSIS — Z951 Presence of aortocoronary bypass graft: Secondary | ICD-10-CM | POA: Diagnosis not present

## 2017-03-14 DIAGNOSIS — Z7901 Long term (current) use of anticoagulants: Secondary | ICD-10-CM | POA: Diagnosis not present

## 2017-03-14 DIAGNOSIS — I110 Hypertensive heart disease with heart failure: Secondary | ICD-10-CM | POA: Diagnosis not present

## 2017-03-14 DIAGNOSIS — F4312 Post-traumatic stress disorder, chronic: Secondary | ICD-10-CM | POA: Insufficient documentation

## 2017-03-14 DIAGNOSIS — F1421 Cocaine dependence, in remission: Secondary | ICD-10-CM | POA: Insufficient documentation

## 2017-03-14 LAB — BASIC METABOLIC PANEL
Anion gap: 12 (ref 5–15)
BUN: 35 mg/dL — AB (ref 6–20)
CO2: 36 mmol/L — ABNORMAL HIGH (ref 22–32)
Calcium: 8.9 mg/dL (ref 8.9–10.3)
Chloride: 85 mmol/L — ABNORMAL LOW (ref 101–111)
Creatinine, Ser: 1.58 mg/dL — ABNORMAL HIGH (ref 0.61–1.24)
GFR calc Af Amer: 53 mL/min — ABNORMAL LOW (ref >60)
GFR, EST NON AFRICAN AMERICAN: 46 mL/min — AB (ref >60)
Glucose, Bld: 185 mg/dL — ABNORMAL HIGH (ref 65–99)
POTASSIUM: 3.8 mmol/L (ref 3.5–5.1)
Sodium: 133 mmol/L — ABNORMAL LOW (ref 135–145)

## 2017-03-14 LAB — CBC
HCT: 32 % — ABNORMAL LOW (ref 39.0–52.0)
Hemoglobin: 9.4 g/dL — ABNORMAL LOW (ref 13.0–17.0)
MCH: 24.4 pg — ABNORMAL LOW (ref 26.0–34.0)
MCHC: 29.4 g/dL — AB (ref 30.0–36.0)
MCV: 82.9 fL (ref 78.0–100.0)
Platelets: 160 10*3/uL (ref 150–400)
RBC: 3.86 MIL/uL — ABNORMAL LOW (ref 4.22–5.81)
RDW: 22.7 % — AB (ref 11.5–15.5)
WBC: 6.6 10*3/uL (ref 4.0–10.5)

## 2017-03-14 LAB — PROTIME-INR
INR: 1.76
Prothrombin Time: 20.8 seconds — ABNORMAL HIGH (ref 11.4–15.2)

## 2017-03-14 LAB — LACTATE DEHYDROGENASE: LDH: 215 U/L — ABNORMAL HIGH (ref 98–192)

## 2017-03-14 MED ORDER — TORSEMIDE 20 MG PO TABS: 80.0000 mg | ORAL_TABLET | Freq: Two times a day (BID) | ORAL | 6 refills | Status: DC

## 2017-03-14 NOTE — Progress Notes (Signed)
Ronald Miller. is a 61 y.o. male patient. CD-IOP: Discharge for medical reasons. The patient is discharged from the CD-IOP today. He appeared for one group session on May 21 and stayed for half of another session, but otherwise he has missed 8 total sessions and 5 consecutive groups. When we spoke last week, he agreed that he would return to group on Monday, June 11. However, he did not appear nor did he phone to explain his absence. If he agrees to come back, he will start over and sign a behavioral contract agreeing to a minimum of absences. Discharged as of today, Feb 11, 2017.        Brandon Melnick, LCAS

## 2017-03-14 NOTE — Patient Instructions (Signed)
Return to clinic in one week  Increase Torsemide to 80 mg (3 tablets) twice a day.  Judson Roch (home health) will administer Lasix monday, Wednesday, and Friday. Metolazone only on Monday and Friday.
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
## 2017-03-15 ENCOUNTER — Encounter (HOSPITAL_COMMUNITY): Payer: Self-pay | Admitting: *Deleted

## 2017-03-16 ENCOUNTER — Inpatient Hospital Stay (HOSPITAL_COMMUNITY): Admit: 2017-03-16 | Payer: Self-pay

## 2017-03-16 ENCOUNTER — Other Ambulatory Visit (HOSPITAL_COMMUNITY): Payer: Medicare HMO | Attending: Psychiatry | Admitting: Psychology

## 2017-03-16 ENCOUNTER — Ambulatory Visit: Payer: Self-pay

## 2017-03-16 ENCOUNTER — Encounter (HOSPITAL_COMMUNITY): Payer: Self-pay | Admitting: Psychology

## 2017-03-16 DIAGNOSIS — F1421 Cocaine dependence, in remission: Secondary | ICD-10-CM

## 2017-03-16 DIAGNOSIS — F4312 Post-traumatic stress disorder, chronic: Secondary | ICD-10-CM

## 2017-03-16 IMAGING — CR DG CHEST 2V
2 series · 2 of 2 positions shown · non-contrast
Comparison: 10/13/2015

CLINICAL DATA: Shortness of breath, worsening the past 2 days.
Chest tightness.

EXAM:
CHEST  2 VIEW

[chest pa]
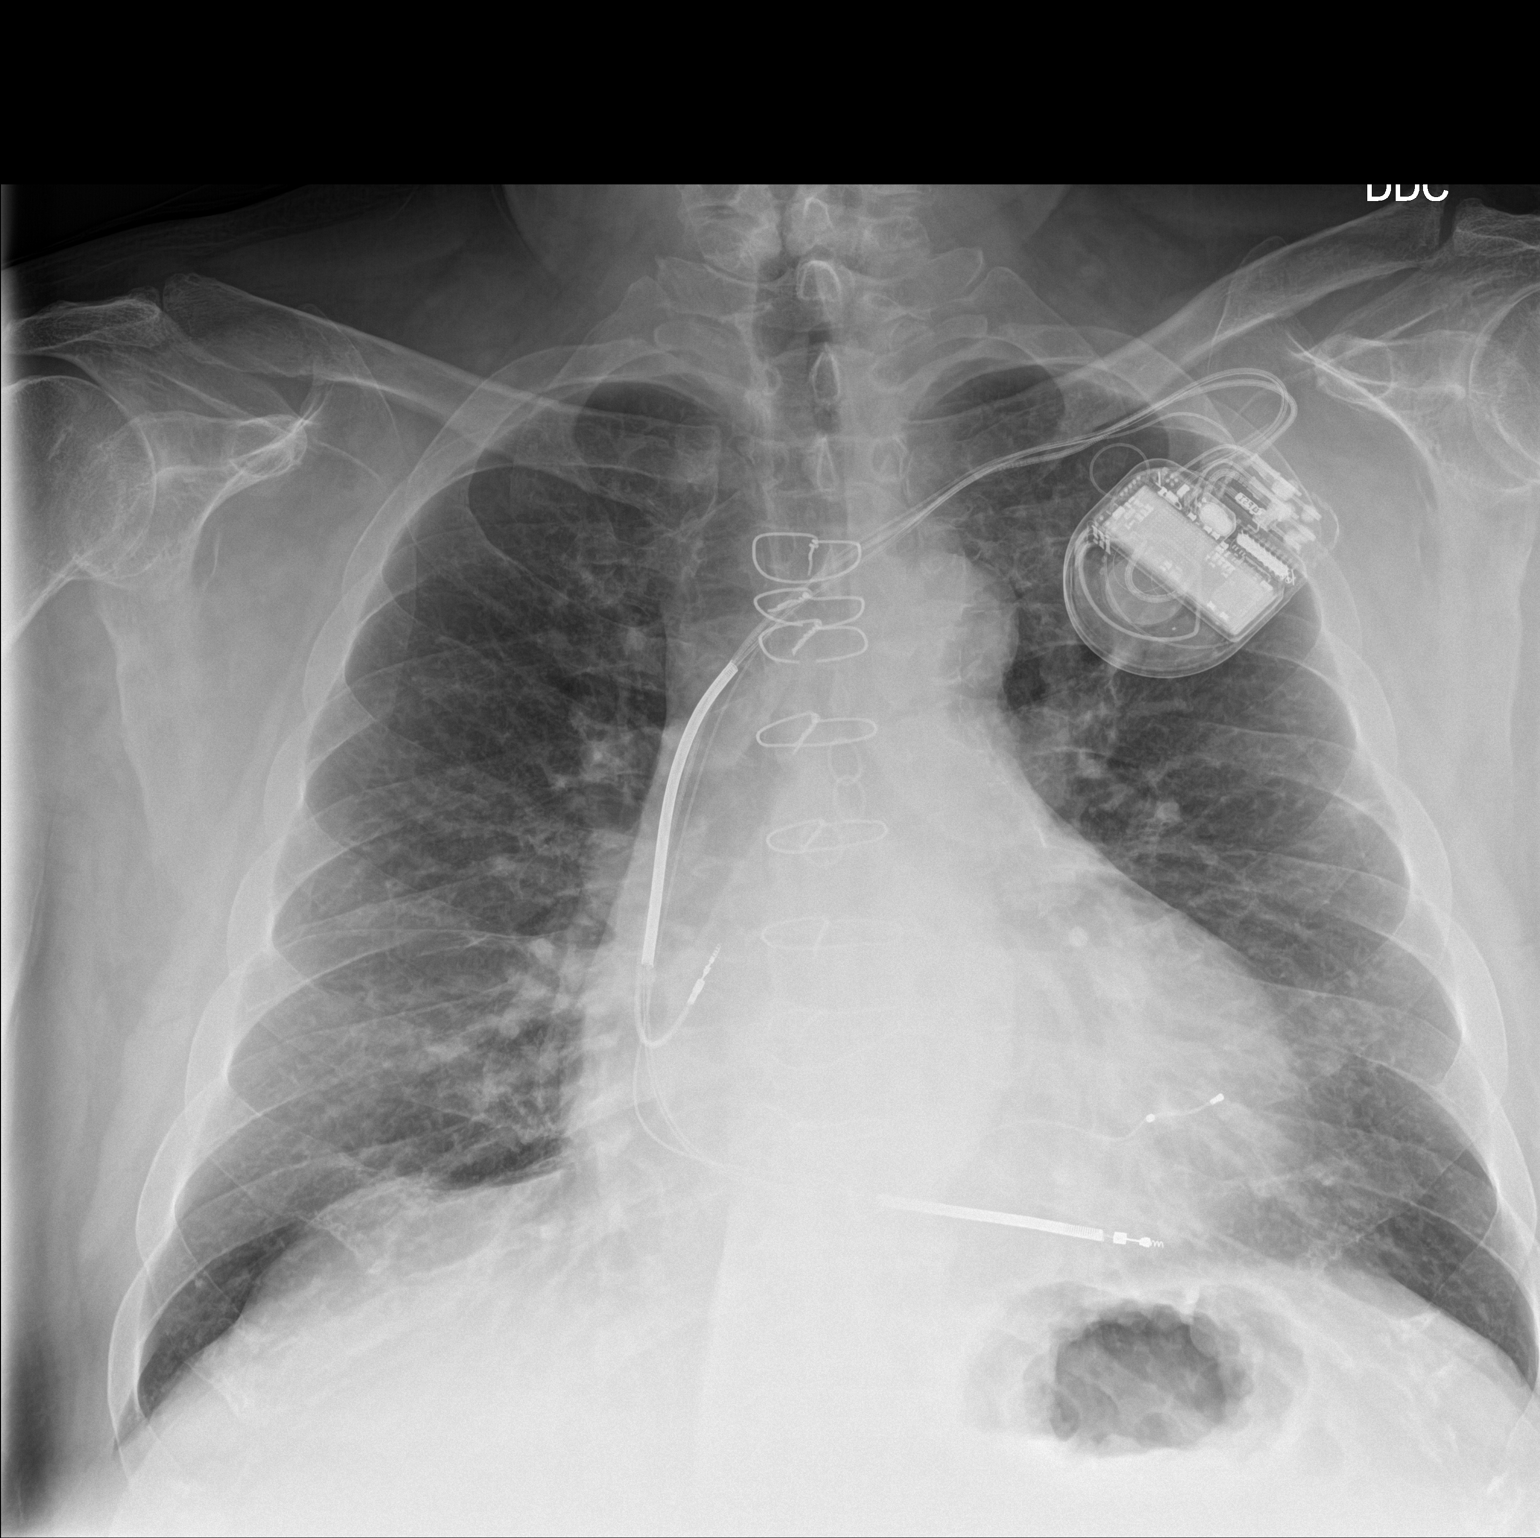

[chest lat]
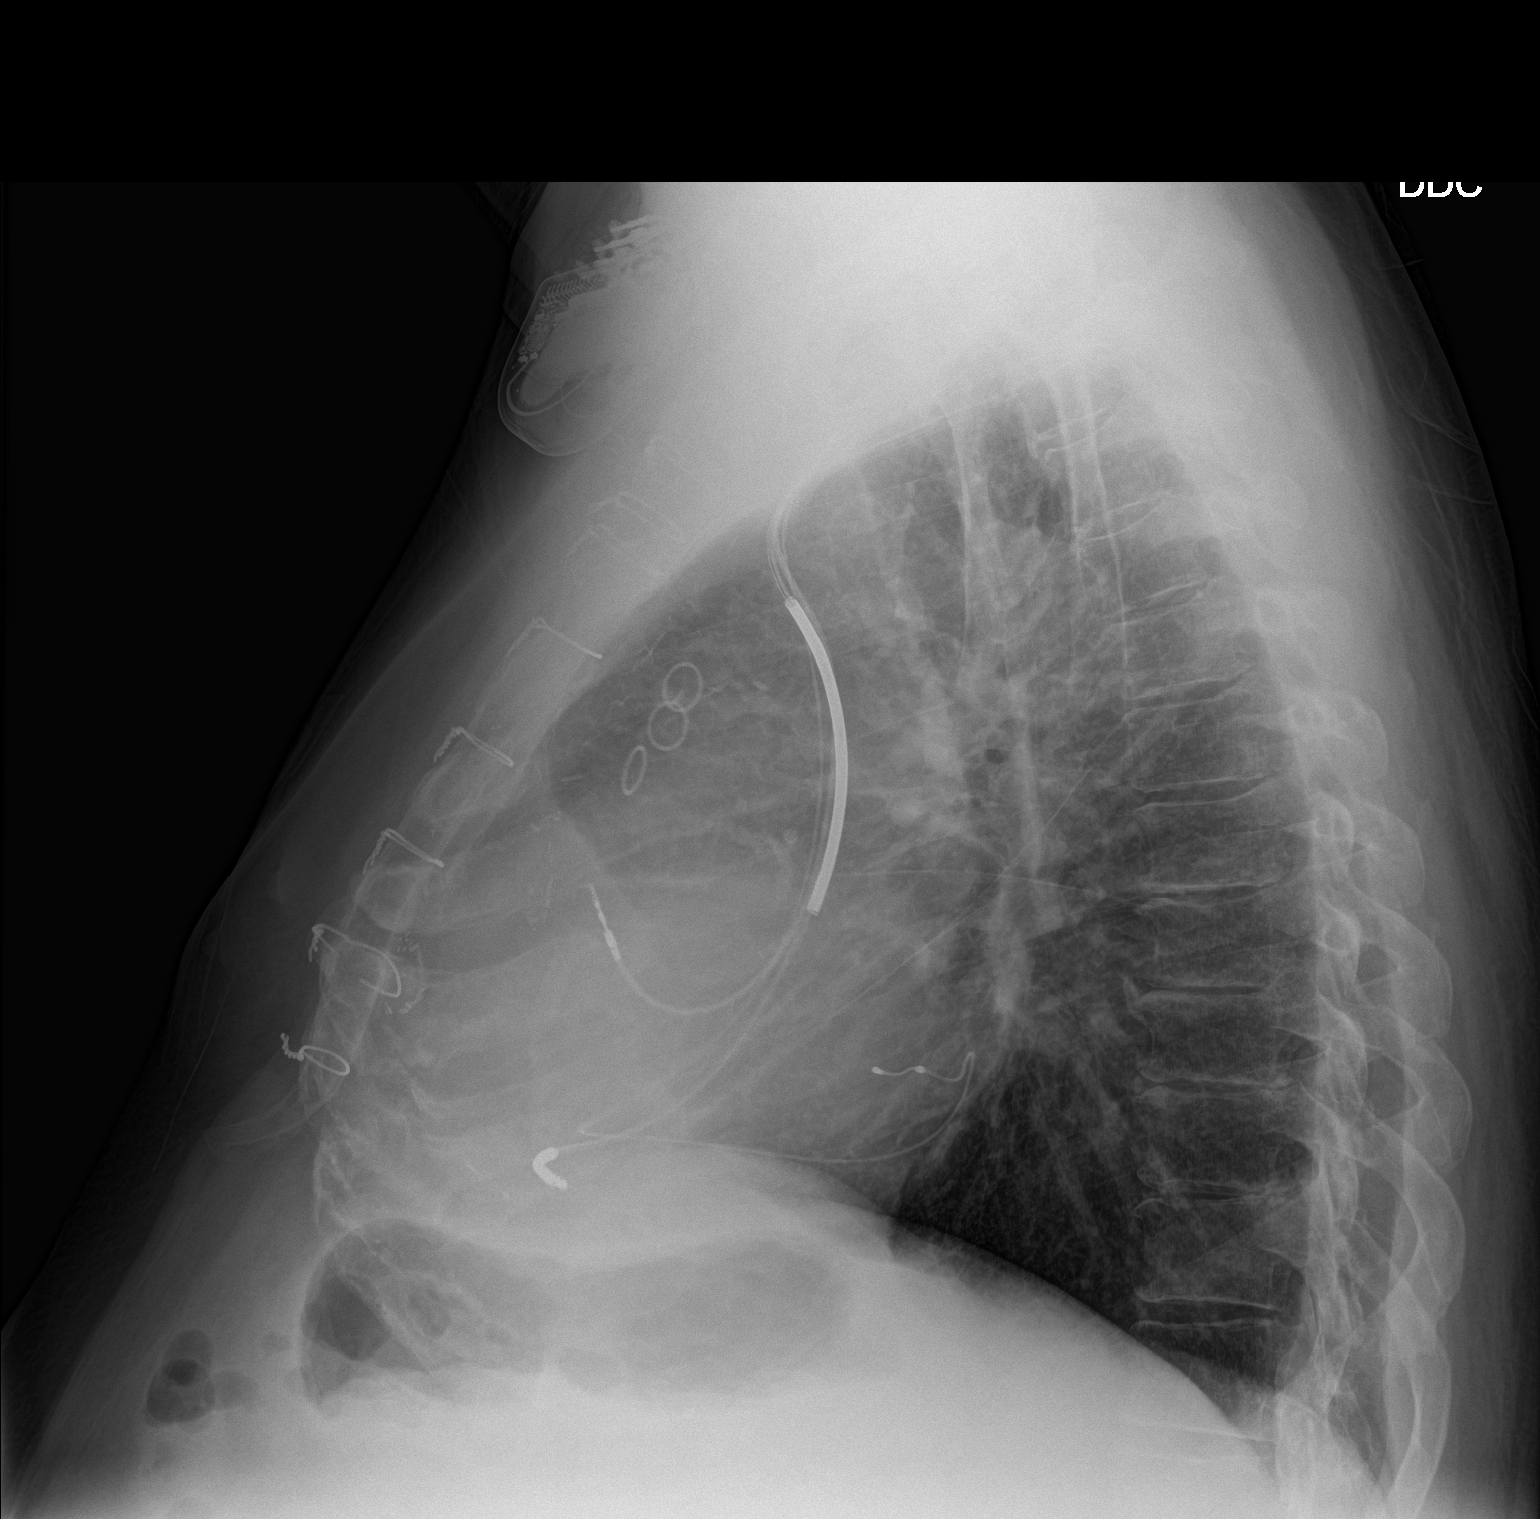

[2 of 2 positions shown; findings below may reference images not displayed]

FINDINGS: Left AICD remains in place, unchanged. Prior CABG. Cardiomegaly. No
confluent opacities, effusions or edema. No acute bony abnormality.
IMPRESSION: Cardiomegaly.  No active disease.
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
## 2017-03-17 ENCOUNTER — Other Ambulatory Visit (HOSPITAL_COMMUNITY): Payer: Medicare HMO | Admitting: Psychology

## 2017-03-17 ENCOUNTER — Telehealth (HOSPITAL_COMMUNITY): Payer: Self-pay | Admitting: Unknown Physician Specialty

## 2017-03-17 DIAGNOSIS — F1421 Cocaine dependence, in remission: Secondary | ICD-10-CM

## 2017-03-17 NOTE — Telephone Encounter (Signed)
Pt called with c/o tightness in his abdomen and soreness in his abdomen. Pt states "I'm gathering fluid." Pt was instructed to take his evening Demadex now. His [place] is scheduled to give him IV Lasix in the morning. Pt was instructed that if he cannot make it until the morning to come to the ED tonight. He was instructed to page the [place] pager before coming to the ED. Pt verbalized understanding.

## 2017-03-18 ENCOUNTER — Encounter (HOSPITAL_COMMUNITY): Payer: Self-pay | Admitting: Psychology

## 2017-03-18 ENCOUNTER — Ambulatory Visit: Payer: Self-pay

## 2017-03-18 IMAGING — CT CT CHEST W/O CM
1 series · 16 of 33 positions shown, 20 images · non-contrast
Comparison: 10/27/2015, CT [REDACTED]

CLINICAL DATA: 59-year-old male with history of diabetes
hypertension chronic systolic heart failure. Short of breath and
abdominal wall edema.

EXAM:
CT CHEST WITHOUT CONTRAST
TECHNIQUE: Multidetector CT imaging of the chest was performed following the
standard protocol without IV contrast.

[Series 2: routine chest wo · axial · 0.86mm/px · z∈[-335,+10]mm · 16 of 75 slices shown, 20 images]
[im 3/75  mediastinal]
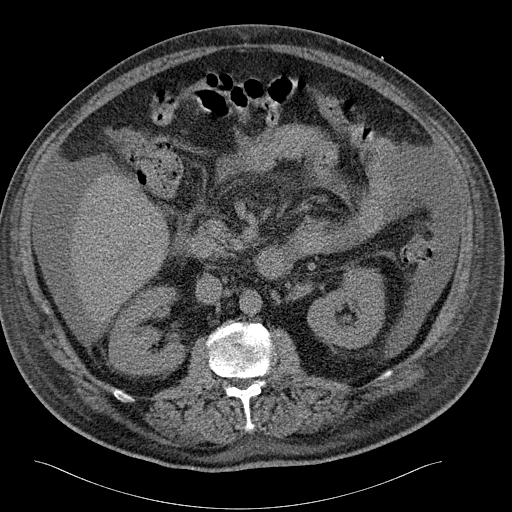
[im 3/75  lung]
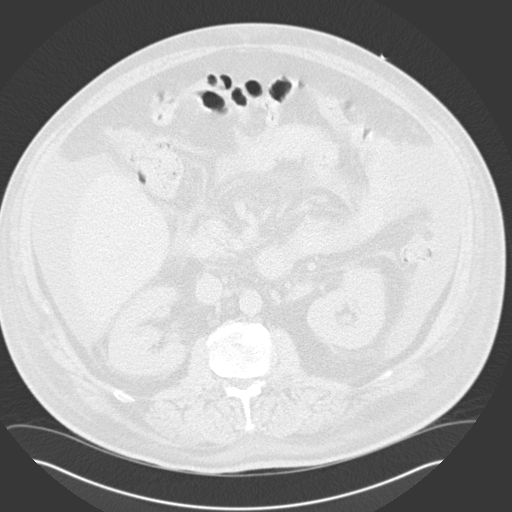
[im 9/75  lung]
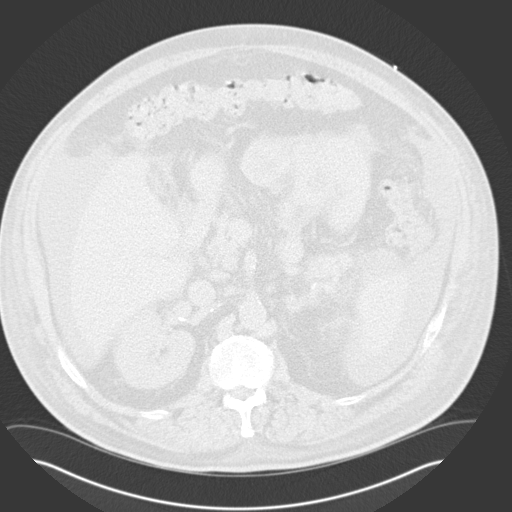
[im 14/75  lung]
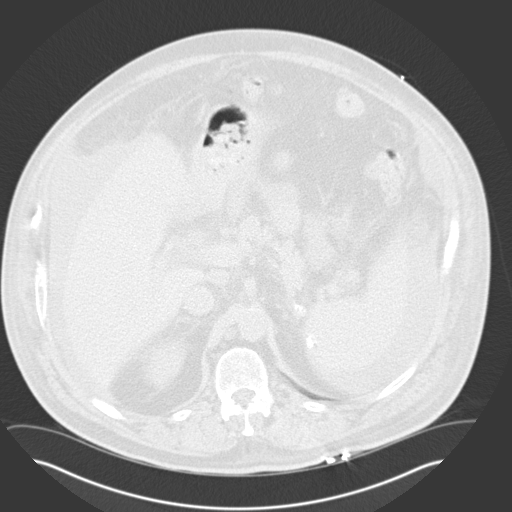
[im 17/75  lung]
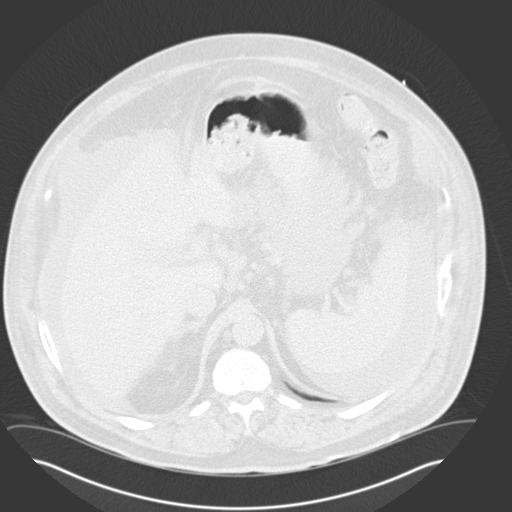
[im 22/75  mediastinal]
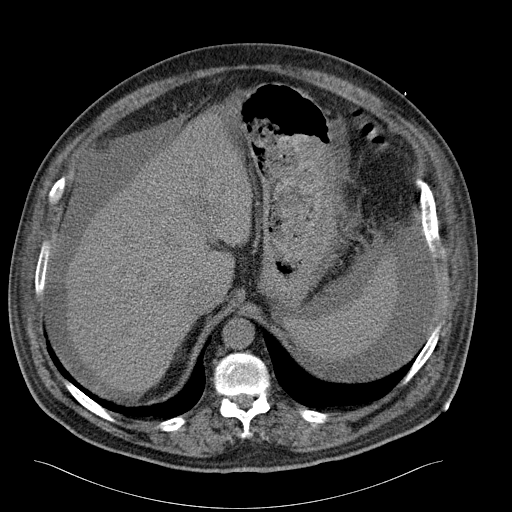
[im 22/75  lung]
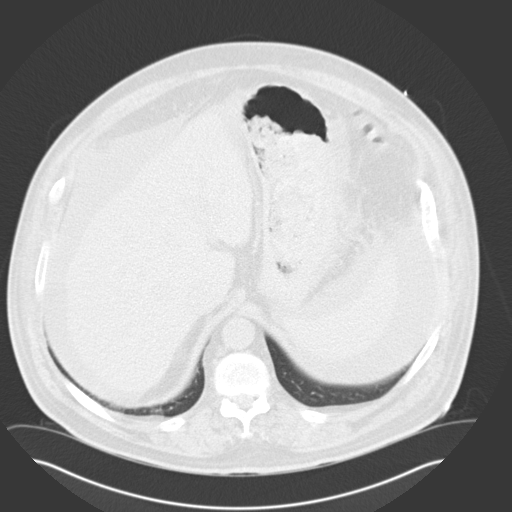
[im 28/75  lung]
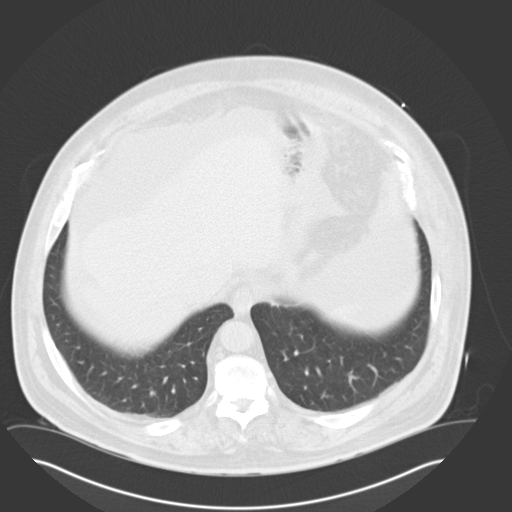
[im 31/75  lung]
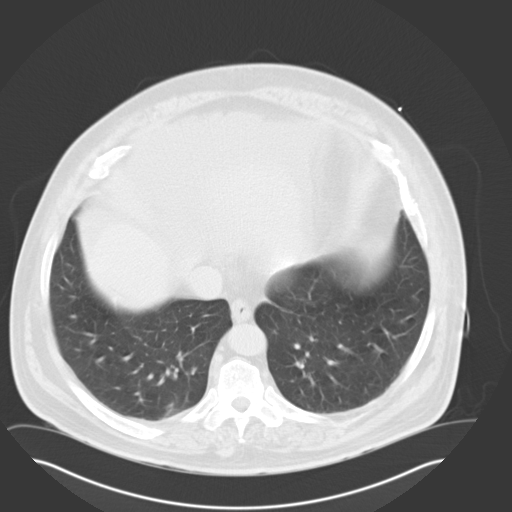
[im 36/75  lung]
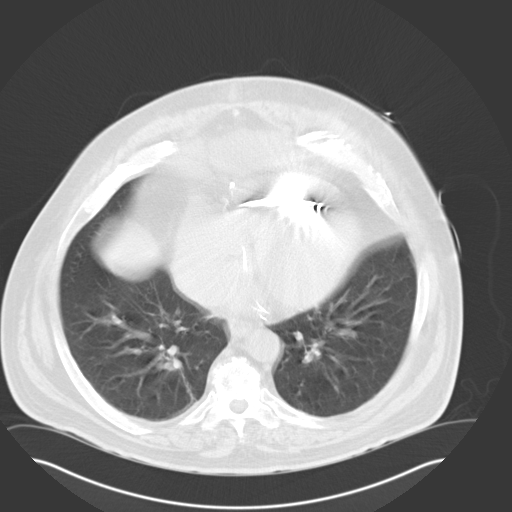
[im 40/75  mediastinal]
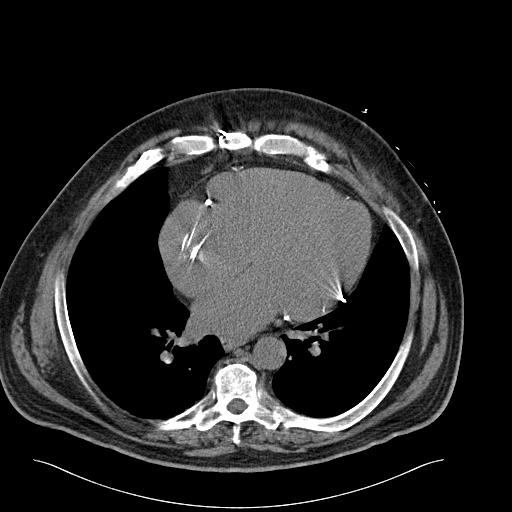
[im 40/75  lung]
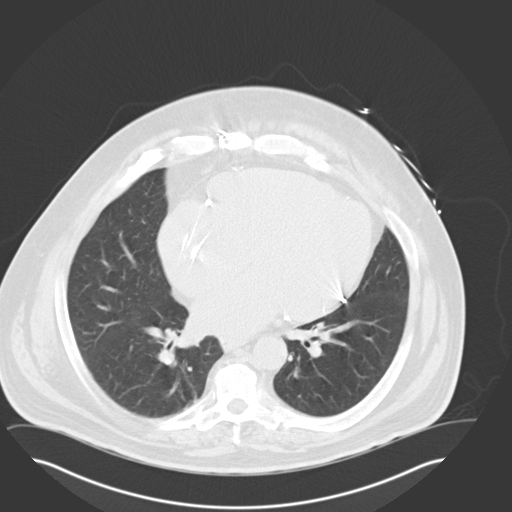
[im 44/75  lung]
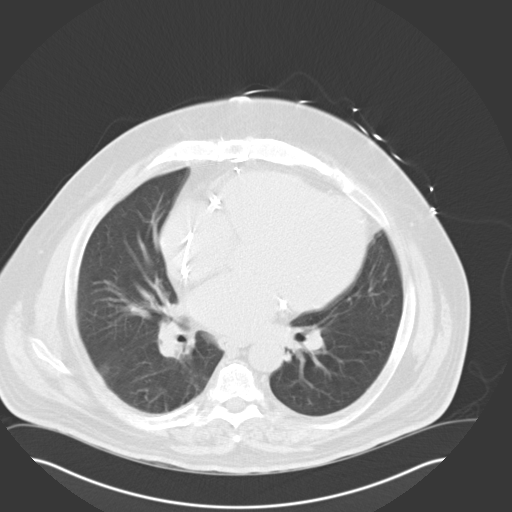
[im 47/75  lung]
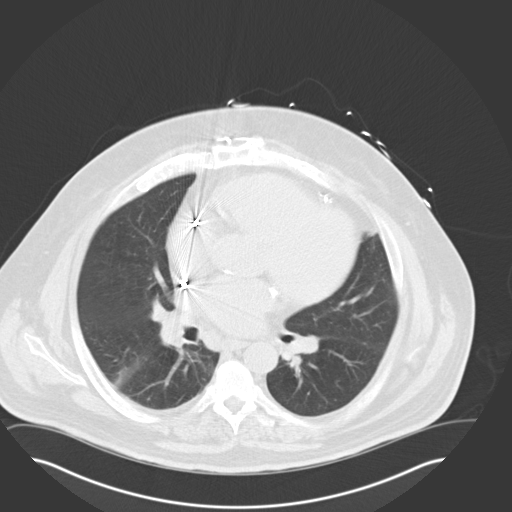
[im 53/75  lung]
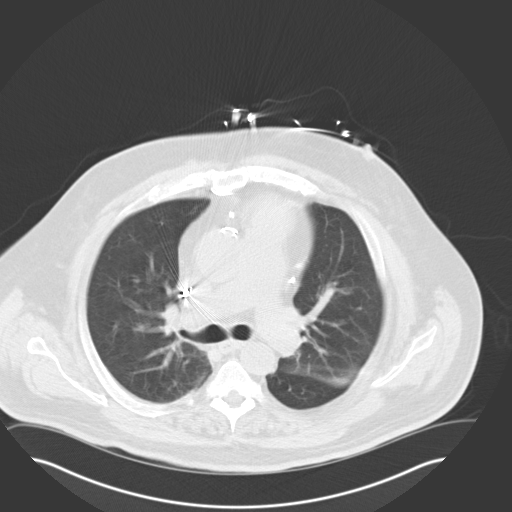
[im 58/75  mediastinal]
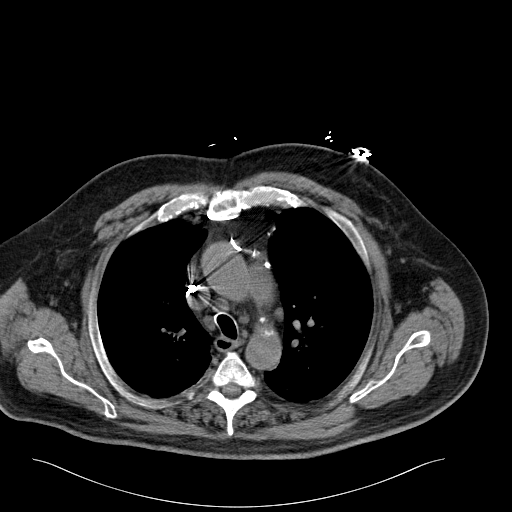
[im 58/75  lung]
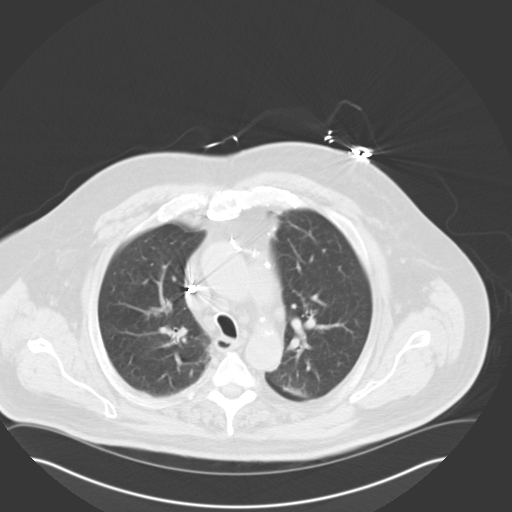
[im 61/75  lung]
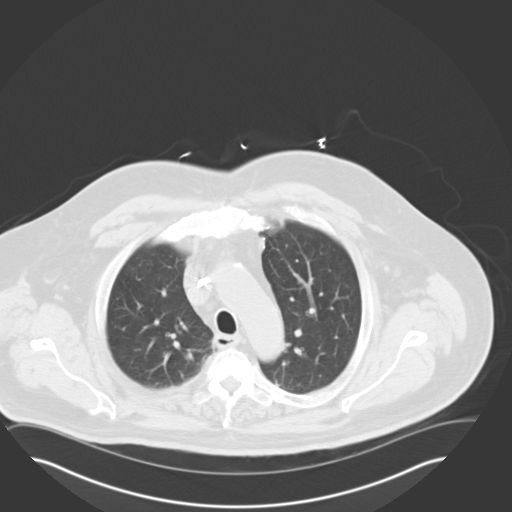
[im 66/75  lung]
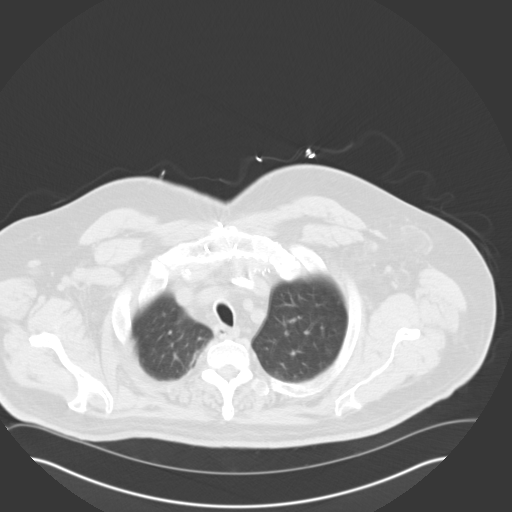
[im 72/75  lung]
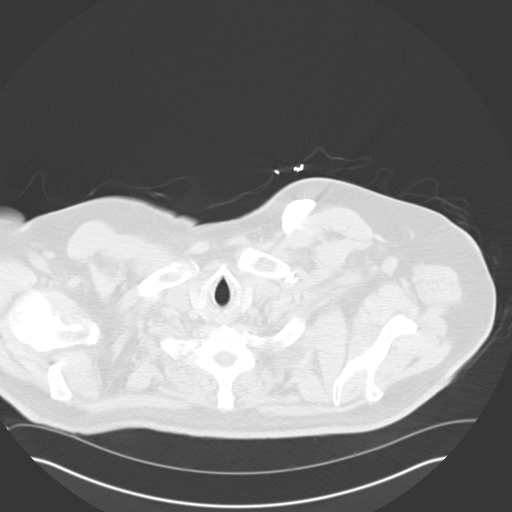

[16 of 33 positions shown; findings below may reference images not displayed]

FINDINGS: Mediastinum/Nodes: Pacemaker in LEFT chest wall. No axillary or
supraclavicular lymphadenopathy. There are multiple small
mediastinal lymph nodes not changed from prior. Heart is enlarged.
Esophagus normal.

Lungs/Pleura: There is no pulmonary edema or interstitial edema. No
pleural fluid. Airways are normal.

Upper abdomen: Moderate volume of fluid surrounding the liver and
spleen. Postcholecystectomy. Adrenal glands are normal.

Musculoskeletal: No aggressive osseous lesion.
IMPRESSION: 1. Cardiomegaly and mediastinal adenopathy not changed from prior.
Adenopathy is likely reactive.
2. No evidence of pulmonary edema or pleural effusions.
3. Upper abdominal fluid likely relates to heart failure or ascites.

## 2017-03-18 NOTE — Progress Notes (Signed)
    Daily Group Progress Note  Program: CD-IOP   03/18/2017 Ronald Miller. [id number]  Diagnosis:  No diagnosis found.   Sobriety Date:   Group Time: 1-2:30pm  Participation Level: Active  Behavioral Response: Sharing  Type of Therapy: Process Group  Interventions: Supportive  Topic: Process: the first half of group was spent in process. Members shared about the challenges and struggles of early recovery. During Thursday's session, each member picks a tongue depressor with a word or phrase on it. They are asked to share what this represents or means to them and how it is playing out in their lives. Random drug tests were collected today.   Group Time: 2:30-4pm  Participation Level: Active  Behavioral Response: Appropriate  Type of Therapy: Psycho-education Group  Interventions: Solution Focused  Topic: Activity: Jenga. The second half of group was spent in an activity playing Jenga. Members remove a block from the structure and read the word that is written on the block. The words are all taken from the 'Feeling Wheel'. They are asked to identify how they relate to this word. The session was very lively and powerful with members identifying, in a number of instances, deep and emotional connections. This activity seemed to promote more comradery and closeness among the group members.    Summary: The patient appeared in group today after missing a couple of weeks due to health complications. He shared about some of the things that hgad been going on and how the right side of his heart is failing. "If I don't get a heart, I am going to die", he explained to his fellow group members. The patient reported he is scheduled to begin cardio rehab next week. His word on the tongue depressor was 'gratitude'. The patient identified that he is grateful for his life and for having the cardio team that he does supporting him as he seeks to qualify for the transplant list. During the  [place] activity, the patient drew the block with the words, "forgiveness". The patient admitted that he must learn to 'forgive myself'. The patient appeared uncomfortable at times and burped repeatedly. He made some good comments during the session, but can also get off topic pretty quickly. He responded well to this intervention and a drug test was collected from the patient today.    UDS collected: No Results:   AA/NA attended?: YesMonday, last Saturday and Sunday  Sponsor?: No   Brandon Melnick, LCAS 03/18/2017 2:46 PM

## 2017-03-19 IMAGING — US US PARACENTESIS
1 series · 7 of 7 positions shown · non-contrast
Comparison: 10/30/2015

CLINICAL DATA: CHF, abdominal ascites, distention and discomfort.

EXAM:
ULTRASOUND GUIDED PARACENTESIS

[Series 1: us paracentesis · 0.31mm/px · 7 of 7 slices shown]
[im 1/7]
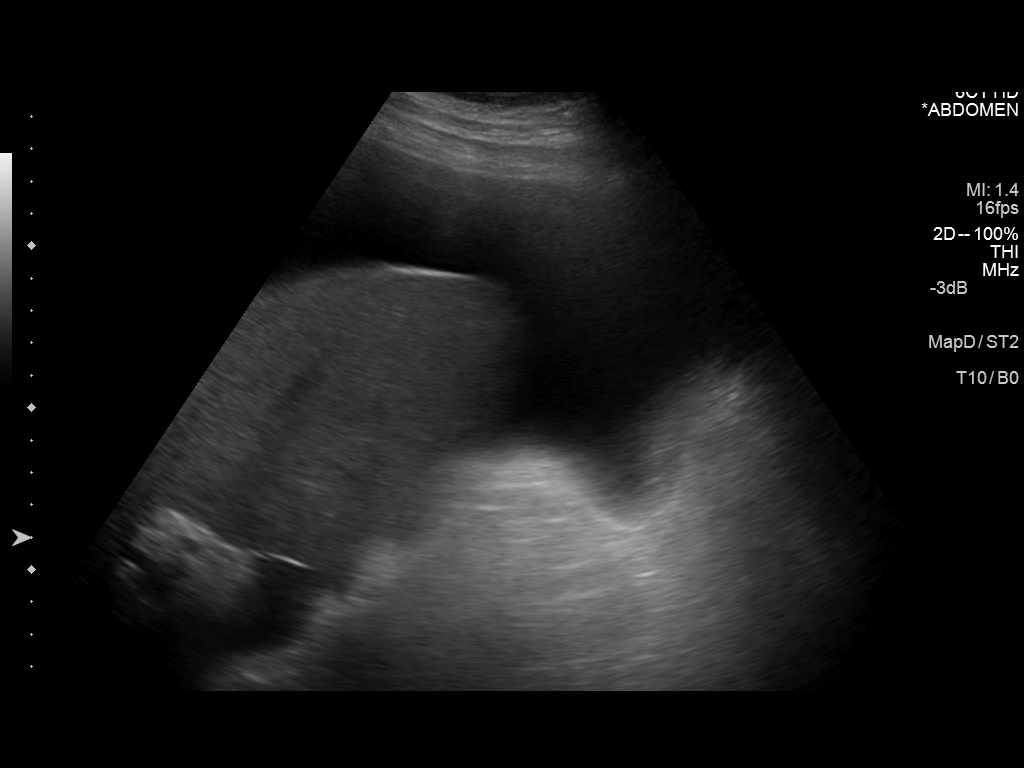
[im 2/7]
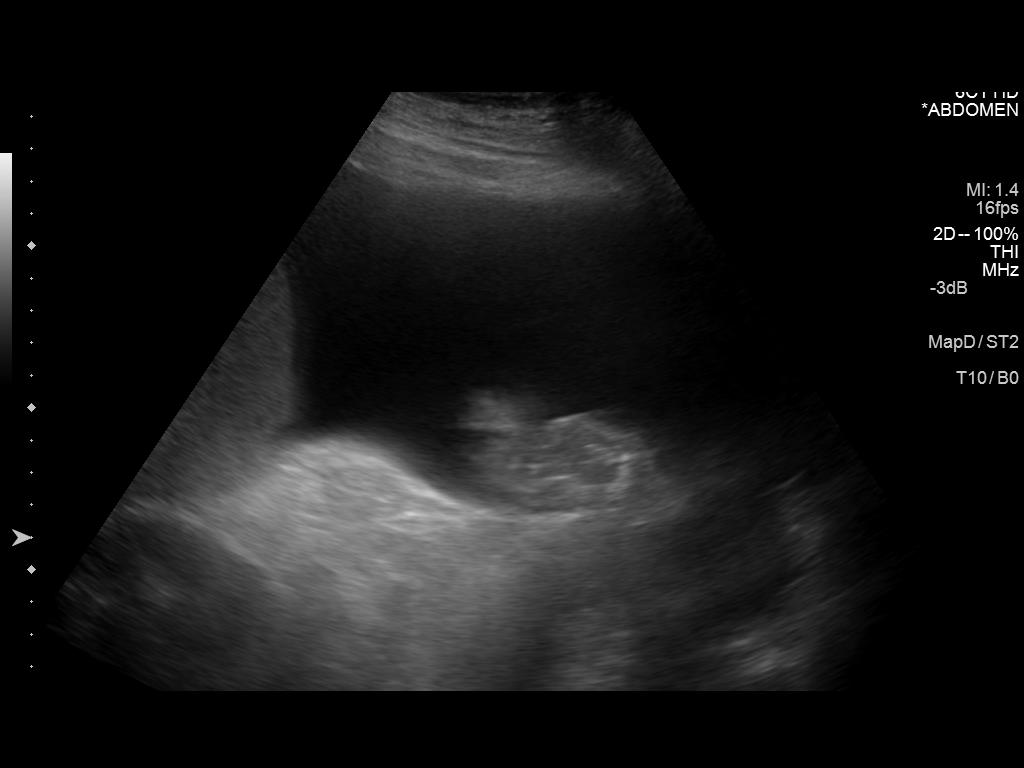
[im 3/7]
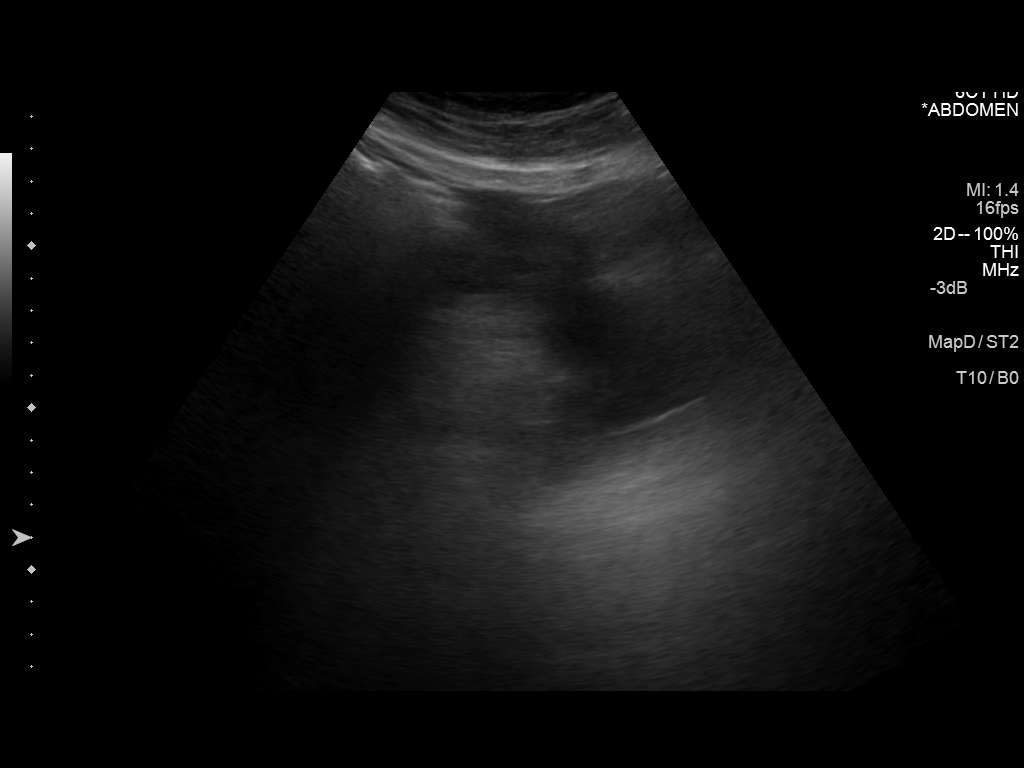
[im 4/7]
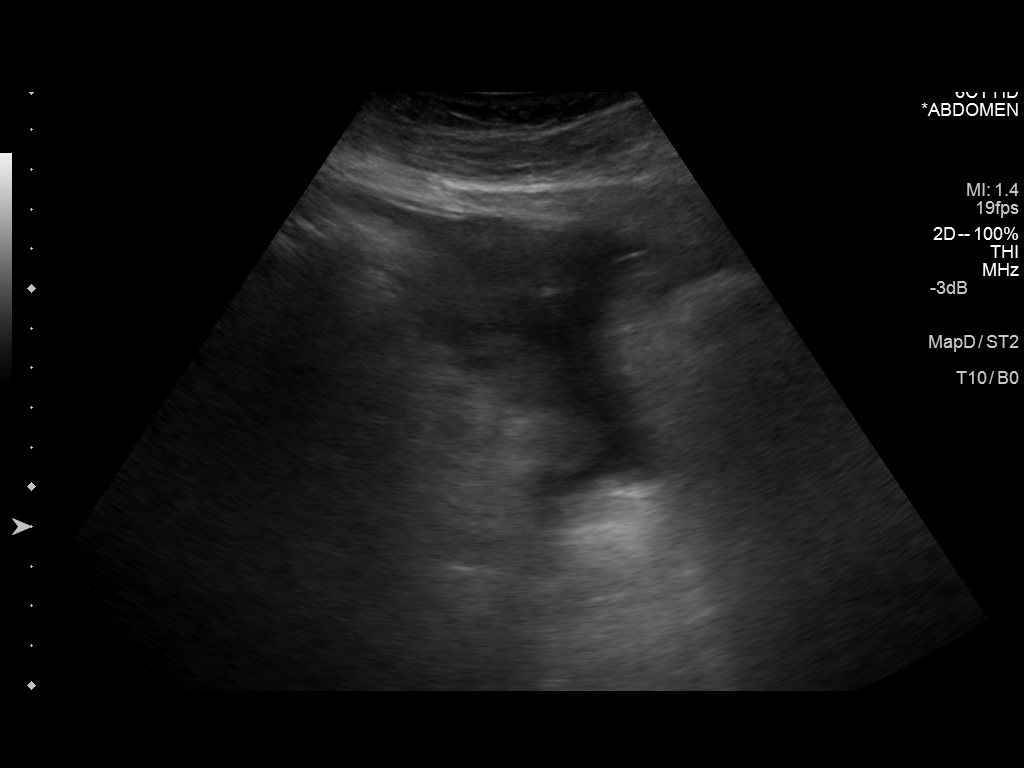
[im 5/7]
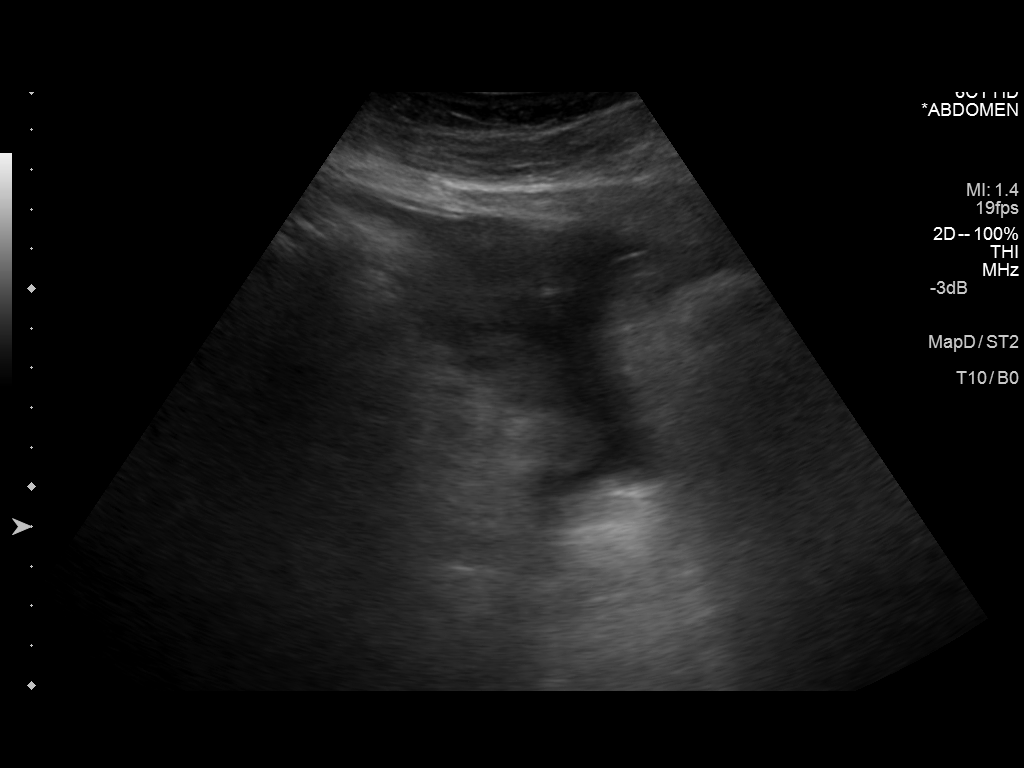
[im 6/7]
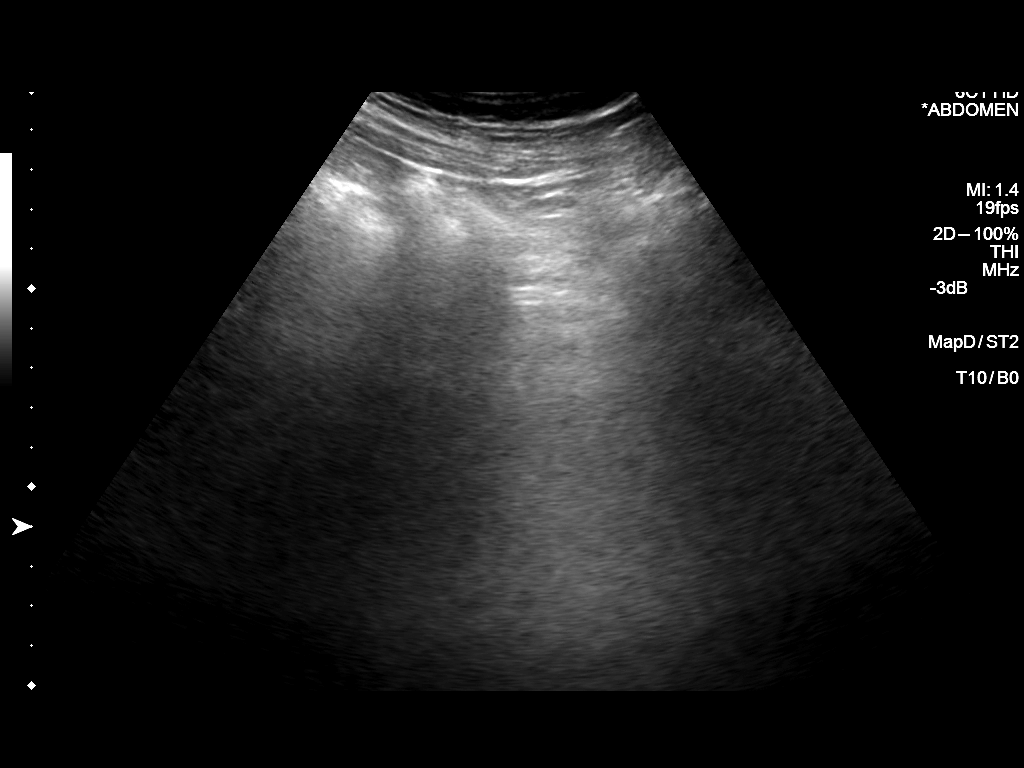
[im 7/7]
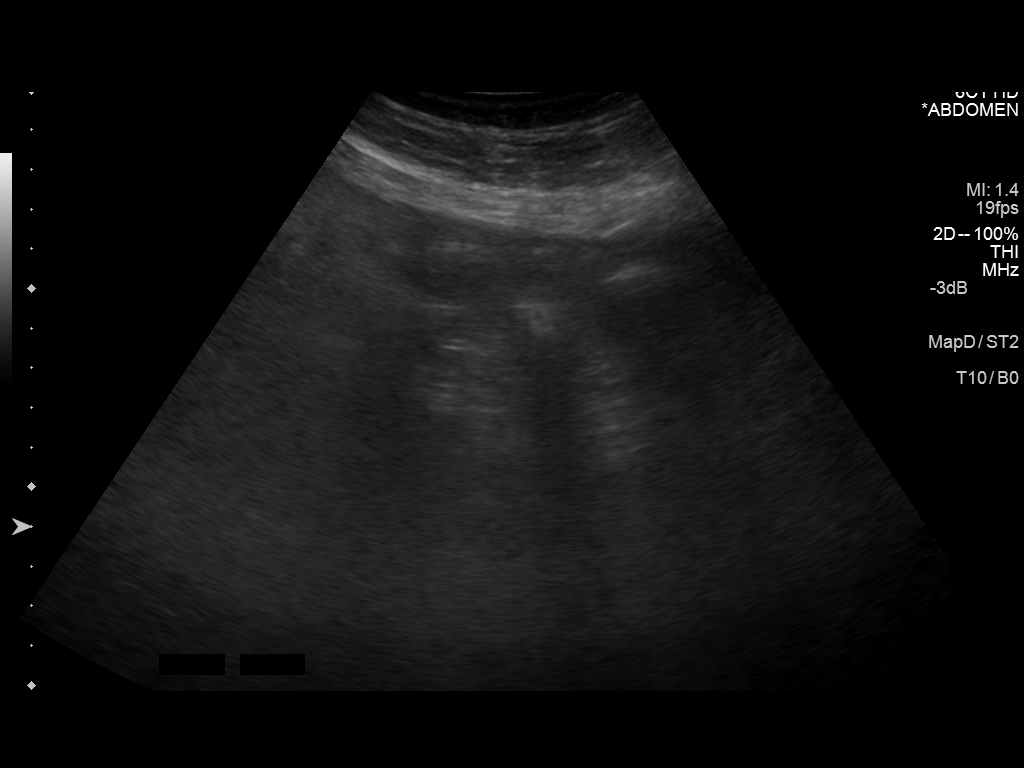

[7 of 7 positions shown; findings below may reference images not displayed]

PROCEDURE:
An ultrasound guided paracentesis was thoroughly discussed with the
patient and questions answered. The benefits, risks, alternatives
and complications were also discussed. The patient understands and
wishes to proceed with the procedure. Written consent was obtained.

Ultrasound was performed to localize and mark an adequate pocket of
fluid in the right lower quadrant of the abdomen. The area was then
prepped and draped in the normal sterile fashion. 1% Lidocaine was
used for local anesthesia. Under ultrasound guidance a safety
centesis needle catheter was introduced. Paracentesis was performed.
The catheter was removed and a dressing applied.

COMPLICATIONS:
None immediate
FINDINGS: A total of approximately 3 L of clear peritoneal fluid was removed.
A fluid sample was sent for laboratory analysis.
IMPRESSION: Successful ultrasound guided paracentesis yielding 3 L of ascites.

## 2017-03-19 IMAGING — US US ABDOMEN LIMITED
1 series · 11 of 11 positions shown · non-contrast
Comparison: 10/29/2015 CT thorax

CLINICAL DATA: Generalized abdominal pain for 2 months evaluate for
ascites in the lower abdomen, ascites suggested on CT thorax,
abdominal distention

EXAM:
LIMITED ABDOMEN ULTRASOUND FOR ASCITES
TECHNIQUE: Limited ultrasound survey for ascites was performed in all four
abdominal quadrants.

[Series 1: us abdomen limited · 0.25mm/px · 11 of 11 slices shown]
[im 1/11]
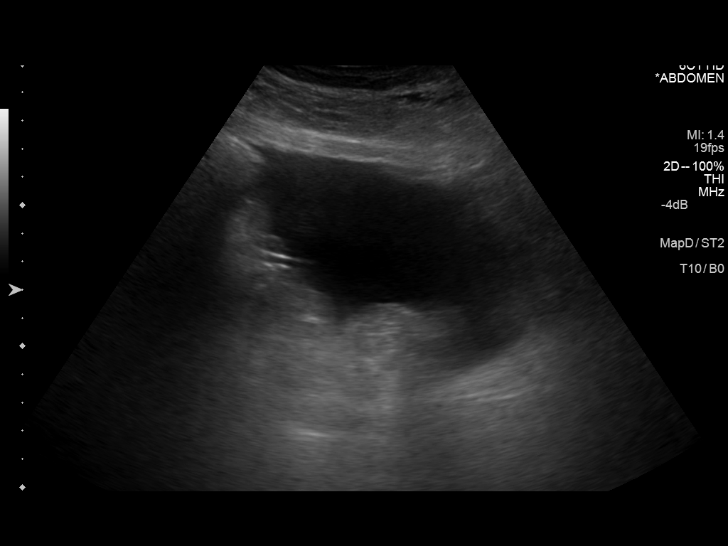
[im 2/11]
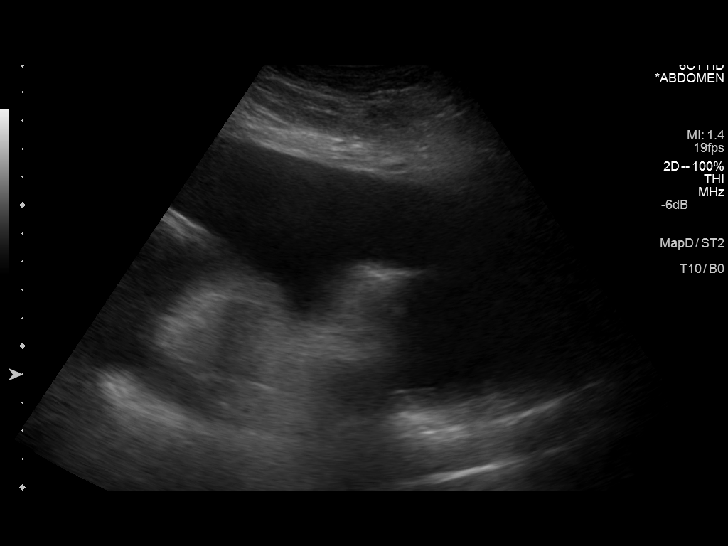
[im 3/11]
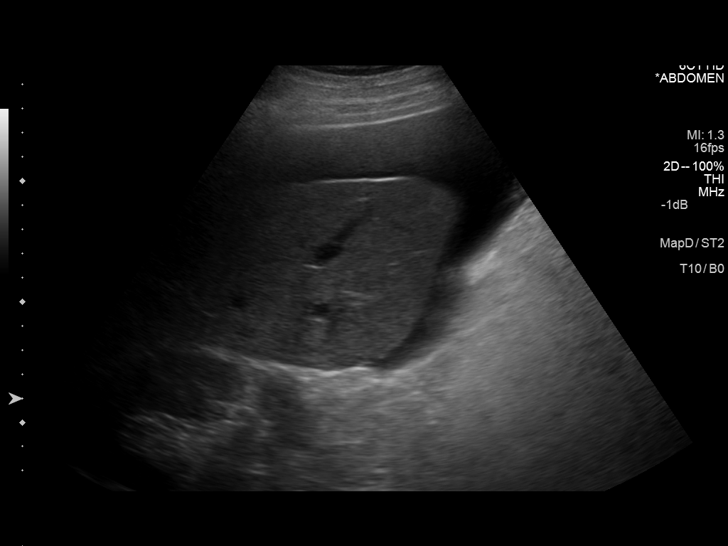
[im 4/11]
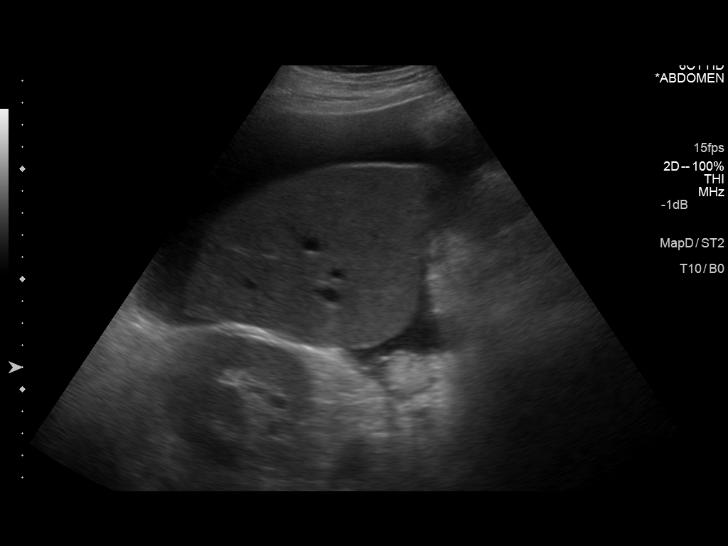
[im 5/11]
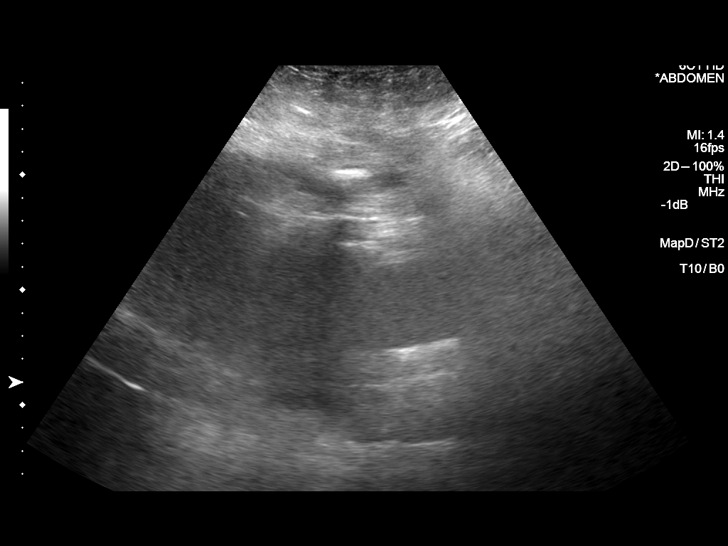
[im 6/11]
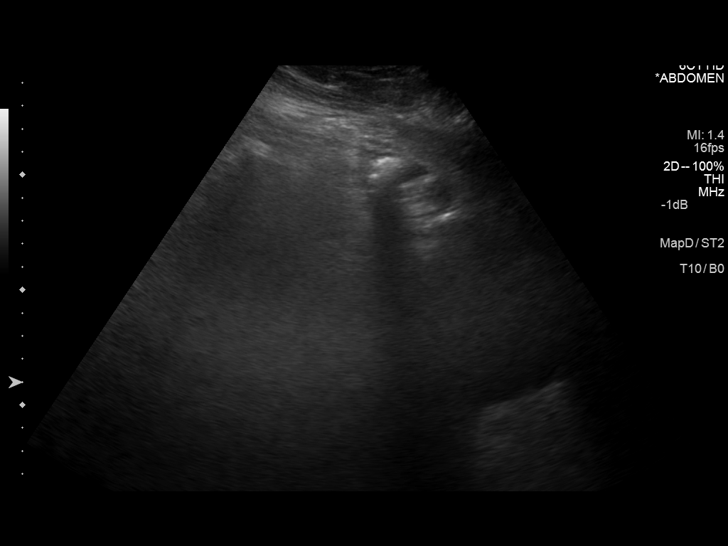
[im 7/11]
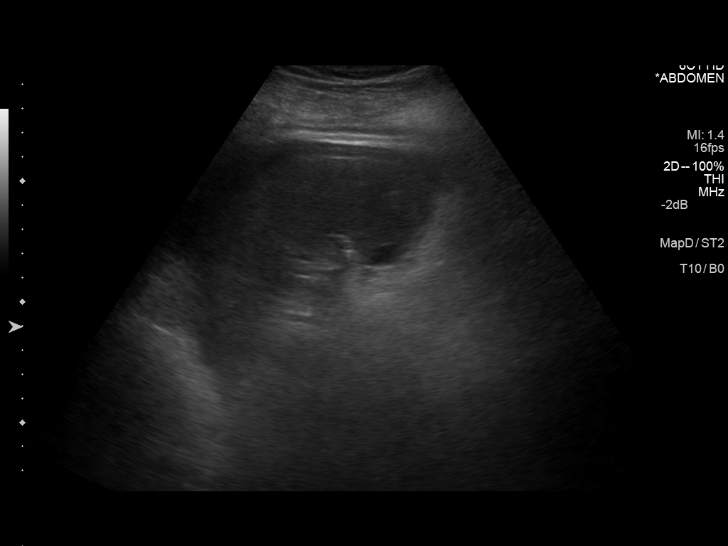
[im 8/11]
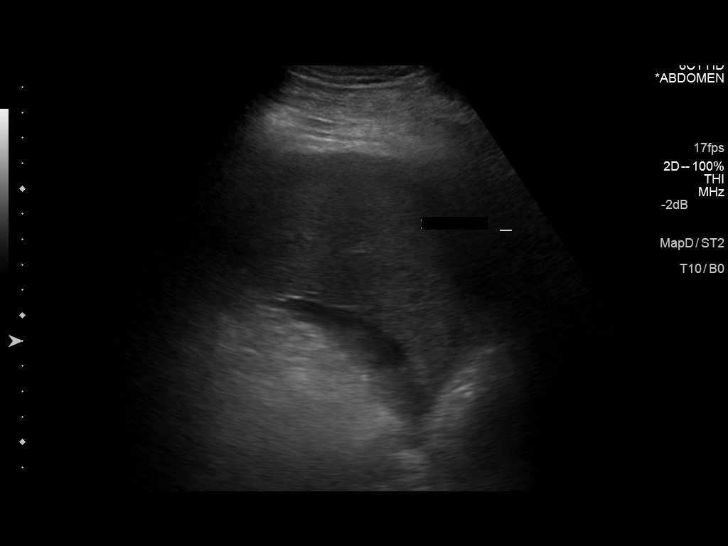
[im 9/11]
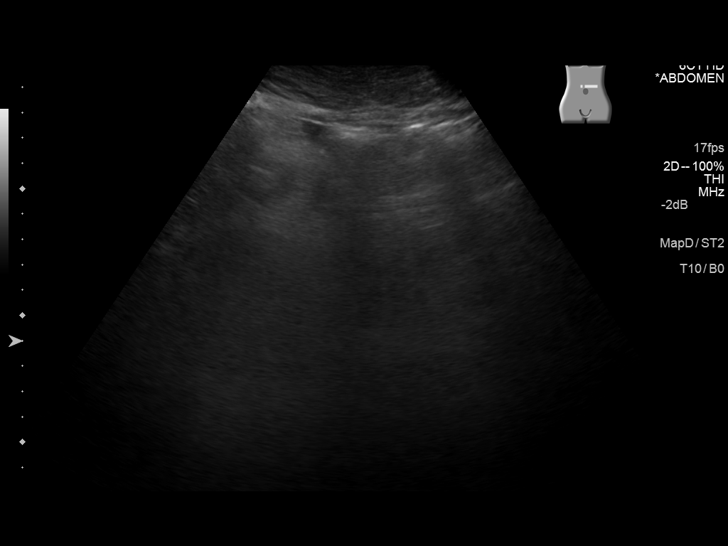
[im 10/11]
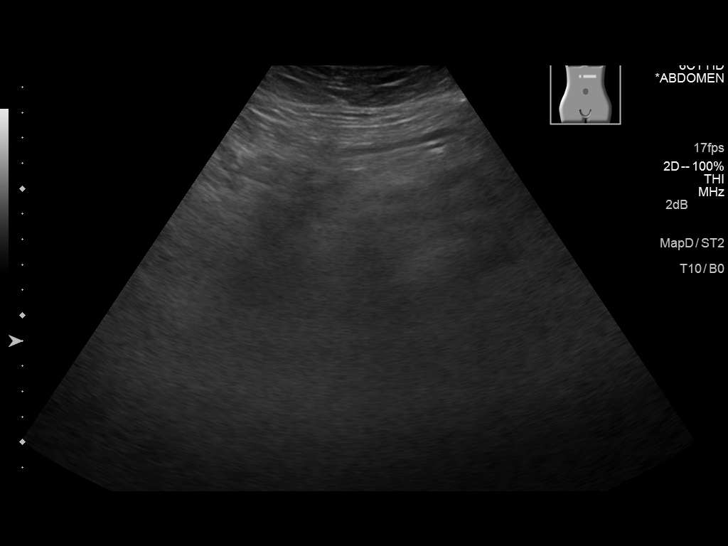
[im 11/11]
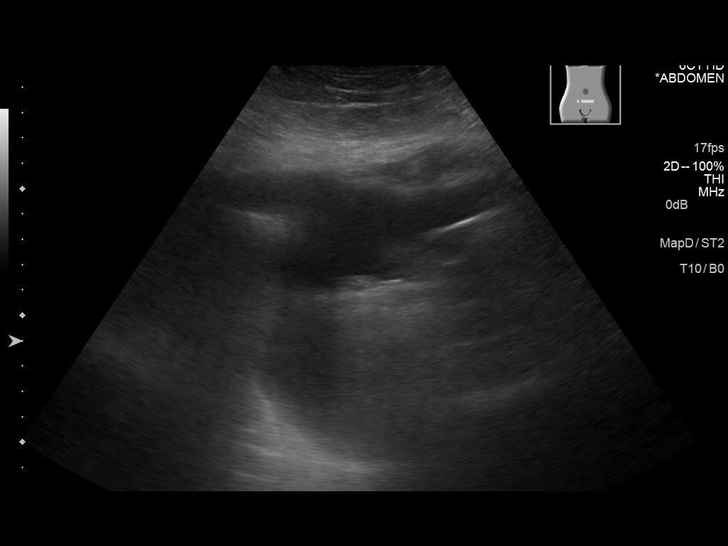

[11 of 11 positions shown; findings below may reference images not displayed]

FINDINGS: A small to moderate volume of ascites is seen throughout the entire
abdomen.
IMPRESSION: Ascites present.

## 2017-03-20 ENCOUNTER — Telehealth (HOSPITAL_COMMUNITY): Payer: Self-pay | Admitting: *Deleted

## 2017-03-21 ENCOUNTER — Ambulatory Visit: Payer: Self-pay

## 2017-03-21 ENCOUNTER — Other Ambulatory Visit (HOSPITAL_COMMUNITY): Payer: Medicare HMO

## 2017-03-21 ENCOUNTER — Telehealth: Payer: Self-pay | Admitting: Unknown Physician Specialty

## 2017-03-21 ENCOUNTER — Telehealth (HOSPITAL_COMMUNITY): Payer: Self-pay | Admitting: Psychology

## 2017-03-21 NOTE — Telephone Encounter (Signed)
[facility] nurse paged to let us know that the patient's weight is up this morning with increased abdominal distention. He has had nausea over the weekend and RLQ  Pain. The [facility] nurse administered 80mg  of IV Lasix per standing order. We will assess pt in clinic tomorrow.

## 2017-03-22 ENCOUNTER — Ambulatory Visit (HOSPITAL_COMMUNITY)
Admission: RE | Admit: 2017-03-22 | Discharge: 2017-03-22 | Disposition: A | Discharge status: Home / Self Care | Payer: Medicare HMO | Source: Ambulatory Visit | Attending: Internal Medicine | Admitting: Internal Medicine

## 2017-03-22 ENCOUNTER — Ambulatory Visit (HOSPITAL_COMMUNITY): Payer: Self-pay | Admitting: Pharmacist

## 2017-03-22 ENCOUNTER — Encounter (HOSPITAL_COMMUNITY): Payer: Self-pay | Admitting: *Deleted

## 2017-03-22 ENCOUNTER — Encounter (HOSPITAL_COMMUNITY): Payer: Self-pay

## 2017-03-22 VITALS — BP 87/59 | HR 65 | Resp 16 | Ht 68.0 in | Wt 234.0 lb

## 2017-03-22 DIAGNOSIS — I2510 Atherosclerotic heart disease of native coronary artery without angina pectoris: Secondary | ICD-10-CM | POA: Insufficient documentation

## 2017-03-22 DIAGNOSIS — Z95811 Presence of heart assist device: Secondary | ICD-10-CM

## 2017-03-22 DIAGNOSIS — I5022 Chronic systolic (congestive) heart failure: Secondary | ICD-10-CM

## 2017-03-22 DIAGNOSIS — D509 Iron deficiency anemia, unspecified: Secondary | ICD-10-CM | POA: Insufficient documentation

## 2017-03-22 DIAGNOSIS — I82621 Acute embolism and thrombosis of deep veins of right upper extremity: Secondary | ICD-10-CM | POA: Diagnosis not present

## 2017-03-22 DIAGNOSIS — Z7901 Long term (current) use of anticoagulants: Secondary | ICD-10-CM | POA: Diagnosis not present

## 2017-03-22 DIAGNOSIS — E871 Hypo-osmolality and hyponatremia: Secondary | ICD-10-CM | POA: Insufficient documentation

## 2017-03-22 DIAGNOSIS — Z951 Presence of aortocoronary bypass graft: Secondary | ICD-10-CM | POA: Diagnosis not present

## 2017-03-22 DIAGNOSIS — I5082 Biventricular heart failure: Secondary | ICD-10-CM | POA: Insufficient documentation

## 2017-03-22 DIAGNOSIS — Z950 Presence of cardiac pacemaker: Secondary | ICD-10-CM | POA: Insufficient documentation

## 2017-03-22 DIAGNOSIS — F1410 Cocaine abuse, uncomplicated: Secondary | ICD-10-CM | POA: Diagnosis not present

## 2017-03-22 DIAGNOSIS — E119 Type 2 diabetes mellitus without complications: Secondary | ICD-10-CM | POA: Insufficient documentation

## 2017-03-22 DIAGNOSIS — I509 Heart failure, unspecified: Secondary | ICD-10-CM | POA: Diagnosis not present

## 2017-03-22 DIAGNOSIS — I110 Hypertensive heart disease with heart failure: Secondary | ICD-10-CM | POA: Insufficient documentation

## 2017-03-22 DIAGNOSIS — R040 Epistaxis: Secondary | ICD-10-CM | POA: Diagnosis not present

## 2017-03-22 DIAGNOSIS — Z794 Long term (current) use of insulin: Secondary | ICD-10-CM | POA: Diagnosis not present

## 2017-03-22 DIAGNOSIS — F329 Major depressive disorder, single episode, unspecified: Secondary | ICD-10-CM | POA: Diagnosis not present

## 2017-03-22 DIAGNOSIS — I50810 Right heart failure, unspecified: Secondary | ICD-10-CM

## 2017-03-22 DIAGNOSIS — Z79899 Other long term (current) drug therapy: Secondary | ICD-10-CM | POA: Insufficient documentation

## 2017-03-22 DIAGNOSIS — Z7982 Long term (current) use of aspirin: Secondary | ICD-10-CM | POA: Diagnosis not present

## 2017-03-22 DIAGNOSIS — I480 Paroxysmal atrial fibrillation: Secondary | ICD-10-CM | POA: Diagnosis not present

## 2017-03-22 DIAGNOSIS — J449 Chronic obstructive pulmonary disease, unspecified: Secondary | ICD-10-CM | POA: Diagnosis not present

## 2017-03-22 LAB — BASIC METABOLIC PANEL
ANION GAP: 11 (ref 5–15)
BUN: 34 mg/dL — AB (ref 6–20)
CHLORIDE: 92 mmol/L — AB (ref 101–111)
CO2: 27 mmol/L (ref 22–32)
Calcium: 8.6 mg/dL — ABNORMAL LOW (ref 8.9–10.3)
Creatinine, Ser: 1.62 mg/dL — ABNORMAL HIGH (ref 0.61–1.24)
GFR calc Af Amer: 52 mL/min — ABNORMAL LOW (ref >60)
GFR calc non Af Amer: 45 mL/min — ABNORMAL LOW (ref >60)
GLUCOSE: 177 mg/dL — AB (ref 65–99)
POTASSIUM: 4.5 mmol/L (ref 3.5–5.1)
Sodium: 130 mmol/L — ABNORMAL LOW (ref 135–145)

## 2017-03-22 LAB — CBC
HEMATOCRIT: 32 % — AB (ref 39.0–52.0)
HEMOGLOBIN: 9.6 g/dL — AB (ref 13.0–17.0)
MCH: 24.6 pg — ABNORMAL LOW (ref 26.0–34.0)
MCHC: 30 g/dL (ref 30.0–36.0)
MCV: 81.8 fL (ref 78.0–100.0)
Platelets: 193 10*3/uL (ref 150–400)
RBC: 3.91 MIL/uL — ABNORMAL LOW (ref 4.22–5.81)
RDW: 21.5 % — AB (ref 11.5–15.5)
WBC: 7.1 10*3/uL (ref 4.0–10.5)

## 2017-03-22 LAB — LACTATE DEHYDROGENASE: LDH: 234 U/L — AB (ref 98–192)

## 2017-03-22 LAB — PROTIME-INR
INR: 2.79
Prothrombin Time: 30 seconds — ABNORMAL HIGH (ref 11.4–15.2)

## 2017-03-22 NOTE — Patient Instructions (Signed)
Return to clinic Friday morning.  Take metolazone 5 mg every day for 3 days.

## 2017-03-22 NOTE — Progress Notes (Signed)
Advanced Heart Failure Clinic Note    Referring Provider: Darylene Price, FNP Primary Care: [facility] Primary Cardiologist: Dr Haroldine Laws.  Transplant Center- [facility]  HPI: Ronald Miller. is a 61 y.o. male with history of chronic systolic HF s/p Medtronic ICD 2014, CAD s/p CABG in 2000, HTN, Hx of cocaine abuse, Tobacco abuse, Depression, PAF, and COPD. HM3 placed 09/09/2016.   Admitted 11/2016 with volume overload and low output heart failure. Diuresed with IV lasix and placed on milrinone. Discharged on milrinone.   Admitted 01/21/2017 with volume overload. Mixed venous saturation was low so milrinone was increased to 0.375 mcg. Milrinone was stopped with persistently low mixed venous saturation so dobtuamine was started. He discharged on 0.5 mcg dobutamine. Hospital course complicated by RUE swelling. US showed non occlusive DVT in R brachial vein. PICC removed and later tunneled PICC was placed. Discharge weight 233 pounds.   Underwent ramp RHC on 02/11/17 which showed primarily RV failure with normal output.   VAD speed 5700  On dobutamine 5 mcg/kg/min  RA = 17 RV =  44/18 PA =  41/15 (27) PCW = 16 Fick cardiac output/index = 8.1/3.7 Thermo CO/CI = 6.7/3.0 Ao sat = 95% PA sat = 62%, 62%  VAD speed 5900  RA = 16 PA =  44/15 (28) PCW = 15 Fick cardiac output/index = 8.1/3.7 Ao = 95% PA sat = 65%  Admitted 03/04/17-03/08/17 with recurrent right heart failure and severe hyponatremia. CVP > 20. Treated with tolvaptan and IV lasix. Diuresed 11 pounds. D/c weight 223. Also treated with IV feraheme for iron-deficiency anemia. CT chest negative for outlfow tract obstruction. Sent home with order for IV lasix every Monday and Friday.   Returns today for  follow up. Continues to struggle with volume overload and RV failure despite home dobutamine and IV lasix. Says he is bloated and gets SOB with any exertion. Weight up. Continues to drink fluid saying his HHRN  told him to drink more fluid,.Getting IV lasix M/W/F with metolazone on M/F and says urine output really hasn't picked up. Denies syncope and presyncope. Denies bleeding, CP, problems with driveline or neuro symptoms. Has missed multiple visits with his counselor.    Denies LVAD trauma. Denies driveline trauma, erythema, or drainage. No ICD shocks.   Reports taking Coumadin as prescribed and adherence to anticoagulation based dietary restrictions. Denies bright red blood per rectum or melena, no dark urine or hematuria.    VAD Indication: Destination therapy- eval ongoing at [facility]    VAD interrogation & Equipment Management: Speed:5900 Flow: 5.7 Power:4.6 w PI:2.7  Alarms: no clinical alarms Events: 3-5 PI events daily  Fixed speed 5900 Low speed limit: 5600  Primary Controller: Replace back up battery in 36 months. Back up controller: Replace back up battery in 78months.  Past Medical History:  Diagnosis Date  . AICD (automatic cardioverter/defibrillator) present   . ASCVD (arteriosclerotic cardiovascular disease)   . Chronic systolic CHF (congestive heart failure) ([place])   . COPD (chronic obstructive pulmonary disease) ([place])   . Coronary artery disease   . Depression   . Diabetes mellitus   . History of cocaine abuse   . Hypertension   . Presence of permanent cardiac pacemaker   . Shortness of breath dyspnea   . Suicidal ideation   . Tobacco abuse     Current Outpatient Prescriptions  Medication Sig Dispense Refill  . acetaminophen (TYLENOL) 325 MG tablet Take 650 mg by mouth daily as needed for  moderate pain or headache.    Marland Kitchen amiodarone (PACERONE) 200 MG tablet Take 1 tablet (200 mg total) by mouth daily. 90 tablet 6  . aspirin EC 81 MG EC tablet Take 1 tablet (81 mg total) by mouth daily. 30 tablet 6  . citalopram (CELEXA) 10 MG tablet Take 1 tablet (10 mg total) by mouth daily. 90 tablet 6  . digoxin (LANOXIN) 0.25 MG tablet Take 0.25 mg by mouth daily.      . DOBUTamine (DOBUTREX) 4-5 MG/ML-% infusion Inject 531 mcg/min into the vein continuous. 250 mL 0  . docusate sodium (COLACE) 100 MG capsule Take 200 mg by mouth at bedtime.    . furosemide (LASIX) 8 MG/ML solution AHC to provide and perform IV lasix 80 mg on Mondays and Fridays to replace morning dose of torsemide.  12  . gabapentin (NEURONTIN) 100 MG capsule Take 2 capsules (200 mg total) by mouth 2 (two) times daily. 180 capsule 3  . insulin aspart (NOVOLOG FLEXPEN) 100 UNIT/ML FlexPen Inject 10 Units into the skin 3 (three) times daily with meals. 15 mL 11  . Insulin Glargine (LANTUS SOLOSTAR) 100 UNIT/ML Solostar Pen Inject 20 Units into the skin daily at 10 pm.    . magnesium oxide (MAG-OX) 400 (241.3 Mg) MG tablet Take 1 tablet (400 mg total) by mouth daily. 30 tablet 6  . metolazone (ZAROXOLYN) 2.5 MG tablet 2.5 mg (1 tablet) every Monday and Friday along with dose of IV lasix. 10 tablet 6  . Multiple Vitamin (MULTIVITAMIN WITH MINERALS) TABS tablet Take 1 tablet by mouth daily.    . ondansetron (ZOFRAN) 4 MG tablet Take 1 tablet (4 mg total) by mouth every 6 (six) hours as needed for nausea or vomiting. 20 tablet 0  . pantoprazole (PROTONIX) 40 MG tablet Take 1 tablet (40 mg total) by mouth daily. 30 tablet 6  . Potassium Chloride ER 20 MEQ TBCR Take 40 mEq by mouth 2 (two) times daily. Take extra 20 meq on Monday and Friday. 130 tablet 6  . senna-docusate (SENOKOT S) 8.6-50 MG tablet Take 2 tablets by mouth every other day.    . sildenafil (REVATIO) 20 MG tablet Take 2 tablets (40 mg total) by mouth 3 (three) times daily. 180 tablet 2  . spironolactone (ALDACTONE) 25 MG tablet TAKE 1 TABLET ONE TIME DAILY 90 tablet 1  . torsemide (DEMADEX) 20 MG tablet Take 4 tablets (80 mg total) by mouth 2 (two) times daily. 180 tablet 6  . traMADol (ULTRAM) 50 MG tablet Take 50 mg by mouth 3 (three) times daily as needed for severe pain.     Marland Kitchen warfarin (COUMADIN) 5 MG tablet Take 3 tablets (15 mg)  daily except 2 tablets (10 mg) on Tuesday.     No current facility-administered medications for this encounter.     Codeine; Trazodone and nefazodone; Lipitor [atorvastatin]; and Tape  REVIEW OF SYSTEMS: All systems negative except as listed in HPI, PMH and Problem list.    Vitals:   03/22/17 1018  BP: (!) 87/59  Pulse: 65  Resp: 16   Vitals:   03/22/17 1018  BP: (!) 87/59  Pulse: 65  Resp: 16   Vital Signs:  Doppler Pressure 102              Automatc BP: 87/59 (73) HR:65  SPO2:97  %  Weight: 234 lb w/o eqt Last weight: 231.8 lb Home weights: 225 lbs   Wt Readings from Last 3 Encounters:  03/22/17  234 lb (106.1 kg)  03/14/17 231 lb (104.8 kg)  03/08/17 223 lb 12.3 oz (101.5 kg)    Physical Exam: GENERAL: Obese male, NAD. Lying on exam table NAD HEENT: normal anicteric NECK: Supple, JVP to jaw.  2+ bilaterally, no bruits.  No lymphadenopathy or thyromegaly appreciated.   CARDIAC:  Mechanical heart sounds with  Normal HM3 sounds   R upper chest tunneled PICC. Site clear.  LUNGS: Clear with decreased BS throughout ABDOMEN: Obese. +distended. NT Good BS LVAD exit site: well-healed and incorporated.  Dressing dry and intact.  No erythema or drainage.  Stabilization device present and accurately applied.  Driveline dressing is being changed daily per sterile technique. No signs infection  Extremities: no cyanosis, clubbing, rash, trace edema Neuro: alert & oriented x 3, cranial nerves grossly intact. moves all 4 extremities w/o difficulty. Affect anxious   ASSESSMENT AND PLAN:  1. Chronic end-stage biventricular systolic HF: LVEF 29% due to ICM -> s/p Echo 08/24/16 LVEF 15%, RV mild dilated, moderately reduced. --> S/P HM3 LVAD 92/01/2682.  - Complicated by RV failure. Initially on milrinone but stopped due to low mixed venous saturation.  Now on dobutamine 5 mcg.  - Continues to struggle with severe RV failure complicated by high fluid intake.   - Volume status  remains elevated on exam despite IV lasix at home. - Options very limited. Stressed need to reduce fluid intake. Will continue home IV lasix and treat with metolazone 5mg  x 3 days to assess response - Continue sildenafil 40 tid. May need to consider changing dobutamine to dopamine. - Continue torsemide to 80mg  BID.   - No beta blocker with RV failure - Continue digoxin 0.125 mcg daily.  - VAD parameters reviewed personally and are stable although PI running on low end. - Warfarin with goal INR 2.0 - 2.5 + ASA 81 daily. INR 2.79 today. Discussed with PharmD - Had epistaxis with lovenox, will avoid this in the future.  - Continues to follow with [facility] for transplant w/u. They are in processing of reassessing him now. We had a long discussion regarding the criteria for transplant consideration. Stressed need that if he is going to miss appointments with his counselor he needs to call to cancel and let them know why he cannot attend appointment. Overall, I do not think he is ideal transplant candidate at this point.  2. CAD: No CP. 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  - No s/s ischemia  3. DMII:  - Followed by PCP.   - Stressed need for better control as part of transplant guidelines. May benefit from Jardiance 4. Tobacco abuse:  - He has stopped smoking.  5. Atrial fibrillation, paroxysmal - In NSR, continue amio 200mg  daily.   6. Left pleural effusion - underwent repeat thoracentesis in Jan. 2018.  - Stable. Recent CXR without significant recurrent effusion. 7.  Anxiety/Depression - Continue Celexa 10mg  daily  - Has missed several appts with counselor. Urged him not to miss. 8. RUE DVT.  - Continue warfarin.  9. Hyponatremia:  - sodium 130. Asymptomatic. Restrict free water. Watch closely with increased metolalzone.  10. Anemia, iron-deficiency:  Hgb 9.4    - Denies hematochezia and melena.  - Received feraheme in hospital 6/18  Total time spent 45  minutes. Over half that time spent discussing above.   Glori Bickers, MD  11:42 AM

## 2017-03-22 NOTE — Progress Notes (Signed)
Patient presents for 1 week  follow up in [facility] today. Reports no problems with VAD equipment or concerns with drive line. States he feels full in his abdomen. Patient states he feels frustrated that he continues to retain fluid and is "tired of feeling so bad". Long talk with patient regarding need to adhere to all counseling appointments and dietary restrictions. He verbalized understanding and will make necessary adjustments. He expressed it is expensive to commute back and forth. Discussed with CSW.  Vital Signs:  Doppler Pressure 102   Automatc BP: 87/59 (73) HR:65   SPO2:97  %  Weight: 234 lb w/o eqt Last weight: 231.8 lb Home weights: 225 lbs   VAD Indication: Destination therapy- eval ongoing at [facility]    VAD interrogation & Equipment Management: Speed:5900 Flow: 5.7 Power:4.6 w    PI:2.7  Alarms: no clinical alarms Events: 3-5 PI events daily  Fixed speed 5900 Low speed limit: 5600  Primary Controller:  Replace back up battery in 36 months. Back up controller:   Replace back up battery in 28 months.  Annual Equipment Maintenance on UBC/PM was performed on 09/2016.   I reviewed the LVAD parameters from today and compared the results to the patient's prior recorded data. LVAD interrogation was NEGATIVE for significant power changes, NEGATIVE for clinical alarms and STABLE for PI events/speed drops. No programming changes were made and pump is functioning within specified parameters. Pt is performing daily controller and system monitor self tests along with completing weekly and monthly maintenance for LVAD equipment.  LVAD equipment check completed and is in good working order. Back-up equipment present. Charged back up battery and performed self-test on equipment.   Exit Site Care: Drive line is being maintained weekly  by VAD Cooridnators. Drive line exit site well healed and incorporated. The velour is fully implanted at exit site. Dressing dry and intact.  No erythema or drainage. Stabilization device present and accurately applied. Pt denies fever or chills. Pt states they have adequate dressing supplies at home.   Significant Events on VAD Support:  11/30/16> Milrinone gtt at discharge 01/27/17>dobutamine at 5 mcg/kg/mingtt at discharge 03/08/17> RV failure  Device:Protect Therapies: off; end of service 08/2016 Last check: complete interrogation today per Darnelle Maffucci (Medtronic). DDD (lower rate 60); BiV pacing with 96% AS-VP  BP & Labs:  MAP102 - Doppler is reflecting modified systolic  Hgb 9.6 - No S/S of bleeding. Specifically denies melena/BRBPR or nosebleeds.  LDH stable at 234 with established baseline of 180- 250. Denies tea-colored urine. No power elevations noted on interrogation.   Plan: 1. Start metolazone 5 mg daily for three days 2. Return to clinic Friday 3. Decrease sodium and fluid intake as much as possible 4. Continue mental health counseling.  Balinda Quails RN [place] Coordinator   Office: [phone] 24/7 Emergency VAD Pager: [phone]

## 2017-03-23 ENCOUNTER — Encounter: Payer: Self-pay | Admitting: Medical

## 2017-03-23 ENCOUNTER — Other Ambulatory Visit (HOSPITAL_COMMUNITY): Payer: Medicare HMO | Admitting: Psychology

## 2017-03-23 ENCOUNTER — Ambulatory Visit: Payer: Self-pay

## 2017-03-23 DIAGNOSIS — F1421 Cocaine dependence, in remission: Secondary | ICD-10-CM

## 2017-03-23 DIAGNOSIS — I5023 Acute on chronic systolic (congestive) heart failure: Secondary | ICD-10-CM

## 2017-03-23 NOTE — Telephone Encounter (Signed)
Pt called VAD pager to report 7.5 wt gain from this am. States he his abdomen "feels tight". Pt has [place] visit scheduled in am for IV Lasix (is getting MWF) but reports he isn't voiding "much" from Lasix. VAD parameters:  Speed 5900, Flow 5.6, Power 4.5, PI 2.5. Pt denies any VAD alarms or VAD equipment issues. Pt denies palpitations, syncope, acute SOB, pedal edema, PND. He does report some nausea relived by Zofran. Instructed pt to continue Zofran as needed; come to ED if symptoms worsen or do not improve. Asked him to have [facility] nurse page VAD pager tomorrow with update on his status. Pt verbalized understanding of same. Dr. Haroldine Laws updated; no additional orders.

## 2017-03-24 ENCOUNTER — Ambulatory Visit (HOSPITAL_COMMUNITY)
Admission: RE | Admit: 2017-03-24 | Discharge: 2017-03-24 | Disposition: A | Discharge status: Home / Self Care | Payer: Medicare HMO | Source: Ambulatory Visit | Attending: Internal Medicine | Admitting: Internal Medicine

## 2017-03-24 ENCOUNTER — Other Ambulatory Visit (HOSPITAL_COMMUNITY): Payer: Medicare HMO | Admitting: Psychology

## 2017-03-24 DIAGNOSIS — F1421 Cocaine dependence, in remission: Secondary | ICD-10-CM | POA: Diagnosis not present

## 2017-03-24 DIAGNOSIS — Z95811 Presence of heart assist device: Secondary | ICD-10-CM

## 2017-03-24 DIAGNOSIS — F4312 Post-traumatic stress disorder, chronic: Secondary | ICD-10-CM

## 2017-03-24 NOTE — Progress Notes (Signed)
Pt presented to VAD clinic with c/o HM III Vad controller alarming "back up battery fault". Pt reports he had similar advisory alarm @ 1 week ago and Balinda Quails, [place] Coordinator changed his battery.  Reviewed pt's history and confirmed "Replace back up battery" advisory alarm initially occurred at 5 am this am and is still ongoing.  Downloaded event log and sent to [email or web address] and Bronson Curb, [place] Clinical Specialist for analysis. Controller changed per Dana's recommendation.   Defective controller:  [id number]   New HM III primary controller:  [phone] with back up battery [id number]  Instructed pt to call VAD pager if any further VAD equipment issues. Pt verbalized understanding of same.

## 2017-03-25 ENCOUNTER — Encounter (HOSPITAL_COMMUNITY): Payer: Self-pay | Admitting: Psychology

## 2017-03-25 ENCOUNTER — Ambulatory Visit (HOSPITAL_COMMUNITY)
Admission: RE | Admit: 2017-03-25 | Discharge: 2017-03-25 | Disposition: A | Discharge status: Home / Self Care | Payer: Medicare HMO | Source: Ambulatory Visit | Attending: Cardiology | Admitting: Cardiology

## 2017-03-25 ENCOUNTER — Ambulatory Visit: Payer: Self-pay

## 2017-03-25 DIAGNOSIS — Z95811 Presence of heart assist device: Secondary | ICD-10-CM | POA: Diagnosis present

## 2017-03-25 LAB — BASIC METABOLIC PANEL
ANION GAP: 10 (ref 5–15)
BUN: 38 mg/dL — AB (ref 6–20)
CALCIUM: 8.9 mg/dL (ref 8.9–10.3)
CO2: 33 mmol/L — AB (ref 22–32)
Chloride: 87 mmol/L — ABNORMAL LOW (ref 101–111)
Creatinine, Ser: 1.69 mg/dL — ABNORMAL HIGH (ref 0.61–1.24)
GFR calc Af Amer: 49 mL/min — ABNORMAL LOW (ref >60)
GFR, EST NON AFRICAN AMERICAN: 42 mL/min — AB (ref >60)
GLUCOSE: 245 mg/dL — AB (ref 65–99)
POTASSIUM: 3.8 mmol/L (ref 3.5–5.1)
Sodium: 130 mmol/L — ABNORMAL LOW (ref 135–145)

## 2017-03-25 NOTE — Patient Instructions (Signed)
Continue Metolazone 2.5 mg m,w,f this week.  Return to clinic next Friday.

## 2017-03-25 NOTE — Progress Notes (Signed)
    Daily Group Progress Note  Program: CD-IOP   03/25/2017 Gunnar Fusi. [id number]  Diagnosis:  No diagnosis found.   Sobriety Date: 07/03/16  Group Time: 1-2:30pm  Participation Level: Active  Behavioral Response: Sharing  Type of Therapy: Process Group  Interventions: Supportive  Topic: Process: The first half of group was spent in process. Members shared about the successes and challenges or speed bumps they have experienced in early recovery. One member admitted he had relapsed and went to the ED. he was discharged early this morning and went right back home and used again. The group discussed this relapse and offered suggestions about what he might change. Another member disclosed the struggles he had on his first day back at work. His cravings were very intense and he was uncertain whether he should even return. Random drug tests were collected from patients who were absent on Monday.   Group Time: 2:30-4pm  Participation Level: Active  Behavioral Response: Sharing  Type of Therapy: Psycho-education Group  Interventions: Strength-based  Topic: Psycho-ED: Chaplain. Dealing with Feelings in early recovery. The second half of group was spent in a psycho-ed on feelings. A chaplain visits monthly with the group and he led a conversation about feelings and the difficulties that accompany early recovery, when one begins to feel again. The conversation was lively with everyone actively sharing. The medical director met with three group members today, including one who will be graduating tomorrow.   Summary: The patient appeared today after having missed group on Monday. He had phoned to let his counselor know he would be absent. The patient offered helpful feedback to a fellow group member and shared that going to rehab had been very helpful for him. He was talkative and made comments throughout the first half of group. In the psycho-ed, the patient shared with the chaplain  that he had been a cocaine addict at one time. He had attained nine years of sobriety but smoked a little in September of last year. Little did he realize that the cocaine would still be in his urine when a drug test was collected three days later. The patient explained that he was 'trying to get a heart transplant' and that he would not live without a transplant. The chaplain sat with this information for a while, but the patient and group seemed unwilling to sit with this statement. The patient admitted, "I am scared". The patient shared openly and responded well to this intervention.    UDS collected: Yes Results: negative  AA/NA attended?: No  Sponsor?: No   Brandon Melnick, LCAS 03/25/2017 12:58 PM

## 2017-03-25 NOTE — Progress Notes (Signed)
    Daily Group Progress Note  Program: CD-IOP   03/25/2017 Gunnar Fusi. [id number]  Diagnosis:  Cocaine use disorder, severe, in early remission, dependence ([place])  Chronic post-traumatic stress disorder (PTSD)   Sobriety Date: 07/03/16  Group Time: 1-2:30  Participation Level: Active  Behavioral Response: Appropriate, Sharing and Intrusive  Type of Therapy: Process Group  Interventions: CBT  Topic: Patients were active and engaged in session. They participated openly in discussion related to their recovery, struggles, and success over the past 24 hours. Pts were also led in a topical discussion on various topics related to addiction, 12 steps, and hope. One pt graduated successfully.     Group Time: 2:30-4  Participation Level: Active  Behavioral Response: Appropriate, Sharing and Intrusive  Type of Therapy: Psycho-education Group  Interventions: CBT and Reframing  Topic: Patients were active and engaged in session. They participated openly in psychoeducation session about differentiating feelings vs. thoughts. Pts were taught about somatic, physiological symptoms of emotions and negative self talk. Pts appeared to encourage each other and make strong connections as to how their emotions and thoughts play into early recovery success.    Summary: Patient was attentive and engaged today. He reported he felt very good and much more positive than previous days. He reported he has lots to be grateful for. He reports his brother called him to ask for help with his own mental health issue. Pt was happy to be able to offer helpful and boundaried feedback to his brother. Pt continues to work on changing his people, places, and things in his life. He denies cravings or thoughts to use. He denies attending any AA meetings.    UDS collected: No Results: negative  AA/NA attended?: No  Sponsor?: No   Youlanda Roys, LPCA, LCASA 03/25/2017 9:47 AM

## 2017-03-25 NOTE — Progress Notes (Signed)
Patient presents for 3 day follow up in [facility] today. Reports no problems with VAD equipment or concerns with drive line. States he feels "great". And wants to continue "whatever it is we started doing" this week. States he has attended his therapy sessions this week.  Weight: 227.4 lb w/o eqt Last weight: 234 lb Home weights: 223 lbs   Plan: 1. Continue Metolazone 2.5 mg this Monday, Wednesday, and Friday 2. Continue IV Lasix 80 mg m,w,f 3. RTC next [place] RN [place] Coordinator   Office: [phone] 24/7 Emergency VAD Pager: [phone]

## 2017-03-28 ENCOUNTER — Telehealth: Payer: Self-pay | Admitting: *Deleted

## 2017-03-28 ENCOUNTER — Encounter: Payer: Self-pay | Admitting: *Deleted

## 2017-03-28 ENCOUNTER — Encounter (HOSPITAL_COMMUNITY): Payer: Self-pay | Admitting: Psychology

## 2017-03-28 ENCOUNTER — Other Ambulatory Visit (HOSPITAL_COMMUNITY): Payer: Medicare HMO | Admitting: Psychology

## 2017-03-28 ENCOUNTER — Ambulatory Visit: Payer: Self-pay

## 2017-03-28 DIAGNOSIS — I5022 Chronic systolic (congestive) heart failure: Secondary | ICD-10-CM

## 2017-03-28 DIAGNOSIS — F1421 Cocaine dependence, in remission: Secondary | ICD-10-CM

## 2017-03-28 DIAGNOSIS — Z95811 Presence of heart assist device: Secondary | ICD-10-CM

## 2017-03-28 DIAGNOSIS — I5023 Acute on chronic systolic (congestive) heart failure: Secondary | ICD-10-CM

## 2017-03-28 NOTE — Telephone Encounter (Signed)
Called to check on status of return.  [name] has a follow up appt scheduled for Friday. I have sent an in basket message to Dr. Haroldine Laws for clearance.  He will get clearance from physician to return.

## 2017-03-29 ENCOUNTER — Encounter: Payer: Self-pay | Admitting: Medical

## 2017-03-29 ENCOUNTER — Telehealth: Payer: Self-pay | Admitting: *Deleted

## 2017-03-29 NOTE — Telephone Encounter (Signed)
-----   Message from Jolaine Artist, MD sent at 03/28/2017  6:52 PM EDT ----- Regarding: RE: Clearance to return to rehab I would like him to return as soon as you feel he can participate. He needs it. Thanks -db  ----- Message ----- From: Clotilde Dieter Sent: 03/25/2017   1:21 PM To: Jolaine Artist, MD Subject: Clearance to return to rehab                   Dr. Haroldine Laws,  When will [name] be ready to return to rehab?  It looks like he is feeling better again.  Please let me know and I will give him a call to get him into classes again.  Thanks for your help! Alberteen Sam, MA, ACSM RCEP 03/25/2017 1:23 PM

## 2017-03-30 ENCOUNTER — Ambulatory Visit: Payer: Self-pay

## 2017-03-30 ENCOUNTER — Telehealth (HOSPITAL_COMMUNITY): Payer: Self-pay | Admitting: Psychology

## 2017-03-30 ENCOUNTER — Other Ambulatory Visit (HOSPITAL_COMMUNITY): Payer: Medicare HMO

## 2017-03-30 ENCOUNTER — Encounter (HOSPITAL_COMMUNITY): Payer: Self-pay | Admitting: Psychology
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
## 2017-03-31 ENCOUNTER — Other Ambulatory Visit (HOSPITAL_COMMUNITY): Payer: Medicare HMO | Admitting: Psychology

## 2017-03-31 ENCOUNTER — Encounter: Payer: Self-pay | Admitting: Medical

## 2017-03-31 DIAGNOSIS — F1421 Cocaine dependence, in remission: Secondary | ICD-10-CM

## 2017-04-01 ENCOUNTER — Ambulatory Visit (HOSPITAL_COMMUNITY): Payer: Self-pay | Admitting: Pharmacist

## 2017-04-01 ENCOUNTER — Ambulatory Visit: Payer: Self-pay

## 2017-04-01 ENCOUNTER — Ambulatory Visit (HOSPITAL_COMMUNITY)
Admission: RE | Admit: 2017-04-01 | Discharge: 2017-04-01 | Disposition: A | Discharge status: Home / Self Care | Payer: Medicare HMO | Source: Ambulatory Visit | Attending: Cardiology | Admitting: Cardiology

## 2017-04-01 DIAGNOSIS — Z95811 Presence of heart assist device: Secondary | ICD-10-CM

## 2017-04-01 LAB — PROTIME-INR
INR: 3.33
Prothrombin Time: 34.6 seconds — ABNORMAL HIGH (ref 11.4–15.2)

## 2017-04-01 LAB — BASIC METABOLIC PANEL
Anion gap: 11 (ref 5–15)
BUN: 53 mg/dL — ABNORMAL HIGH (ref 6–20)
CHLORIDE: 86 mmol/L — AB (ref 101–111)
CO2: 30 mmol/L (ref 22–32)
CREATININE: 1.76 mg/dL — AB (ref 0.61–1.24)
Calcium: 9 mg/dL (ref 8.9–10.3)
GFR calc non Af Amer: 40 mL/min — ABNORMAL LOW (ref >60)
GFR, EST AFRICAN AMERICAN: 47 mL/min — AB (ref >60)
Glucose, Bld: 135 mg/dL — ABNORMAL HIGH (ref 65–99)
POTASSIUM: 3.6 mmol/L (ref 3.5–5.1)
SODIUM: 127 mmol/L — AB (ref 135–145)

## 2017-04-01 LAB — CBC
HEMATOCRIT: 33.5 % — AB (ref 39.0–52.0)
Hemoglobin: 10.1 g/dL — ABNORMAL LOW (ref 13.0–17.0)
MCH: 23.9 pg — AB (ref 26.0–34.0)
MCHC: 30.1 g/dL (ref 30.0–36.0)
MCV: 79.4 fL (ref 78.0–100.0)
Platelets: 244 10*3/uL (ref 150–400)
RBC: 4.22 MIL/uL (ref 4.22–5.81)
RDW: 20.5 % — ABNORMAL HIGH (ref 11.5–15.5)
WBC: 7.9 10*3/uL (ref 4.0–10.5)

## 2017-04-01 LAB — LACTATE DEHYDROGENASE: LDH: 237 U/L — AB (ref 98–192)

## 2017-04-01 NOTE — Progress Notes (Signed)
Patient presents for 1 week  follow up in [facility] today. Reports no problems with VAD equipment or concerns with drive line.  Vital Signs:  Doppler Pressure 88    Automatc BP: 119/47 (89) HR:115   SPO2:97  %  Weight: 227.4 lb w/o eqt Last weight: 227 lb Home weights: 220-225 lbs   VAD Indication: Destination therapy- eval ongoing at [facility]    VAD interrogation & Equipment Management: Speed:5900 Flow: 5.7 Power:4.8 w    NO:MVEH  Alarms: no clinical alarms Events: 6/26 No external power- patient reports he accidentally unplugged himself when waking up that morning  Fixed speed 5900  Low speed limit: 5600  Primary Controller:  Replace back up battery in 31 months. Back up controller:   Replace back up battery in 28 months.  Annual Equipment Maintenance on UBC/PM was performed on 12/201.   I reviewed the LVAD parameters from today and compared the results to the patient's prior recorded data. LVAD interrogation was NEGATIVE for significant power changes, NEGATIVE for clinical alarms and STABLE for PI events/speed drops. No programming changes were made and pump is functioning within specified parameters. Pt is performing daily controller and system monitor self tests along with completing weekly and monthly maintenance for LVAD equipment.  LVAD equipment check completed and is in good working order. Back-up equipment present. Charged back up battery and performed self-test on equipment.   Exit Site Care: Drive line is being maintained weekly  by VAD Coordinators. Drive line exit site well healed and incorporated. The velour is fully implanted at exit site. Dressing dry and intact. No erythema or drainage. Stabilization device present and accurately applied. Pt denies fever or chills. Pt states they have adequate dressing supplies at home.   Significant Events on VAD Support:  11/30/16> Milrinone gtt at discharge 01/27/17>dobutamine at 5 mcg/kg/mingtt at  discharge 03/08/17> RV failure  Device:Protect Therapies: off; end of service 08/2016 Last check: complete interrogation today per Darnelle Maffucci ([employer]). 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 Specifically denies melena/BRBPR or nosebleeds.    Plan: 1. Conitnue metolazone 2.5 mg m,w,f and Lasix 80 mg IV per HHRN on m,w,f. 2. RTC in 1 weekfor dressing and INR 3. RTC in 2 weeks for VAD visit.  Balinda Quails RN [place] Coordinator   Office: [phone] 24/7 Emergency VAD Pager: [phone]

## 2017-04-01 NOTE — Patient Instructions (Signed)
[facility] in one week for dressing change. Return in 2 weeks for VAD visit.

## 2017-04-04 ENCOUNTER — Encounter (HOSPITAL_COMMUNITY): Payer: Self-pay

## 2017-04-04 ENCOUNTER — Encounter (HOSPITAL_COMMUNITY): Payer: Self-pay | Admitting: Psychology

## 2017-04-04 ENCOUNTER — Ambulatory Visit: Payer: Self-pay

## 2017-04-04 ENCOUNTER — Telehealth (HOSPITAL_COMMUNITY): Payer: Self-pay | Admitting: Unknown Physician Specialty

## 2017-04-04 ENCOUNTER — Other Ambulatory Visit (HOSPITAL_COMMUNITY): Payer: Medicare HMO | Attending: Psychiatry

## 2017-04-04 NOTE — Telephone Encounter (Signed)
Received page from [place] stating that he had an episode of black tarry stools on Saturday, but had a regular bowel movement yesterday. Pt also had several nosebleeds over the weekend and his picc line is bloody around the insertion site. pts INR was 3.33 on Friday. Nurse will draw INR, CBC, BMET and Mg tomorrow. We will follow up with these results. Nurse states that the pt weight is normal at 216.8lbs.

## 2017-04-04 NOTE — Progress Notes (Signed)
    Daily Group Progress Note  Program: CD-IOP   04/04/2017 Ronald Miller. [id number]  Diagnosis:  No diagnosis found.   Sobriety Date: 07/03/16  Group Time: 1-2:30pm  Participation Level: Active  Behavioral Response: Sharing  Type of Therapy: Process Group  Interventions: Supportive  Topic: Process: the first half of group was spent in process. Members shared about any issues or problems they are facing in early recovery. They discussed bright or 'shining moments' as well as challenges or 'speed bumps'. The importance of planning for the upcoming holiday next week was reiterated throughout group today.  Group Time: 2:30-4pm  Participation Level: Active  Behavioral Response: Sharing  Type of Therapy: Psycho-education Group  Interventions: Strength-based  Topic: Psycho-Ed: Stages of development, Part 2 and Affirmations. The second half of group was spent in a psycho-ed closing the psycho-ed from yesterday that identified the different developmental stages of childhood up to adulthood. A handout was passed around that identified "Affirmations for Rebonding". These affirmations provide what an adult might need now, based on what he/she did not receive at the appropriate time in their childhood. Members went over the handout together and identified those affirmations that resonated with them today. The assignment for the weekend was for each member to practice saying or reading an affirmation at least once every day. The group will discuss this during the next time we meet, on Monday, July 2.   Summary: The patient reported his meeting with the transplant team in [facility] had gone well. That was his shining moment. He reported he will hear sometime in the next few days whether they will keep him in the program and move forward in getting him on the transplant list. The patient's word was 'Sober Fun'. He reported he fully recognized that one has to change 'people, places and  things' and there is fun in recovery. 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 In the psycho-ed, the patient reported the affirmations all seemed good. He had a rough childhood and validating one's self was not something he was aware of or experienced. The patient provided support and helpful feedback to his fellow group members. he responded well to this intervention and was remined he should attend some 12-step meetings over the weekend.   UDS collected: Yes Results: negative  AA/NA attended?: No  Sponsor?: No   Brandon Melnick, LCAS 04/04/2017 4:42 PM

## 2017-04-05 NOTE — Progress Notes (Signed)
Ronald Miller. is a 61 y.o. male patient. CD-IOP. Excused Absence. The patient arrived about an hour before group today. He admitted he didn't feel well and sat down on the couch in my office. He reported he had phoned his cardiologist, but they had encouraged him to go home and rest. He was frustrated and noted, "they think it's all in my head". He admitted he shoujld have just stayed home in [place] and not driven all the way here. After speaking with the medical director briefly, he agreed that he would go home. I assured him this will be an excused absence and he can return when he is feeling better. He should be hearing from [facility] about the heart transplant list and whether they are going to approve him to continue towards getting a transplant. His heart is failing at a rather rapid pace so if he is able to get on the list, he will likely get a heart within a few months. We will continue to follow closely in the days ahead. Patient is excused from group today.        Brandon Melnick, LCAS

## 2017-04-06 DIAGNOSIS — I2510 Atherosclerotic heart disease of native coronary artery without angina pectoris: Secondary | ICD-10-CM | POA: Diagnosis not present

## 2017-04-07 ENCOUNTER — Other Ambulatory Visit (HOSPITAL_COMMUNITY): Payer: Self-pay | Admitting: Unknown Physician Specialty

## 2017-04-07 ENCOUNTER — Other Ambulatory Visit (HOSPITAL_COMMUNITY): Payer: Medicare HMO

## 2017-04-07 DIAGNOSIS — Z7901 Long term (current) use of anticoagulants: Secondary | ICD-10-CM

## 2017-04-07 DIAGNOSIS — Z95811 Presence of heart assist device: Secondary | ICD-10-CM

## 2017-04-07 NOTE — Progress Notes (Signed)
Ronald D Trentham Jr. is a 61 y.o. male patient. CD-IOP: Treatment Planning Session. Met with the patient today before his group session. I explained the importance of identifying his goals of treatmen. The patient was very agreeable and stated his primary treatment goal is to remain drug-free. He knows from previous experiences and treatment that he cannot do it on his own. "I need help", he admitted and has been active in the [employer] [facility] [employer] in the past. 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 He has reported that there are two AA meetings that are within two blocks of his apartment. When asked about any other goals, the patient identified his third goal as meeting all of the requirements of the transplant team in [place] and being approved to get on the heart transplant list. He is definitely needing a transplant and if he doesn't get one, "I am going to die". We agreed to work with him and if he remains complaint and drug-free we will certainly advocate for his placement on the transplant list. The documentation was reviewed and signed and the treatment plan completed accordingly. We will continue to follow closely in the days ahead.          , LCAS 

## 2017-04-08 ENCOUNTER — Other Ambulatory Visit (HOSPITAL_COMMUNITY): Payer: Self-pay

## 2017-04-08 ENCOUNTER — Ambulatory Visit: Payer: Self-pay

## 2017-04-08 ENCOUNTER — Ambulatory Visit (HOSPITAL_COMMUNITY)
Admission: RE | Admit: 2017-04-08 | Discharge: 2017-04-08 | Disposition: A | Discharge status: Home / Self Care | Payer: Medicare HMO | Source: Ambulatory Visit | Attending: Cardiology | Admitting: Cardiology

## 2017-04-08 DIAGNOSIS — Z7901 Long term (current) use of anticoagulants: Secondary | ICD-10-CM

## 2017-04-08 DIAGNOSIS — Z95811 Presence of heart assist device: Secondary | ICD-10-CM

## 2017-04-08 DIAGNOSIS — F05 Delirium due to known physiological condition: Secondary | ICD-10-CM | POA: Diagnosis not present

## 2017-04-08 DIAGNOSIS — I5022 Chronic systolic (congestive) heart failure: Secondary | ICD-10-CM | POA: Diagnosis not present

## 2017-04-08 LAB — PROTIME-INR
INR: 3.02
Prothrombin Time: 31.9 seconds — ABNORMAL HIGH (ref 11.4–15.2)

## 2017-04-08 NOTE — Progress Notes (Signed)
Pt states he is feeling well and weight is down. He is in good spirits today.   Exit Site Care: Drive line is being maintained weekly  by VAD Coordinators. Drive line exit site well healed and incorporated. The velour is fully implanted at exit site. Dressing dry and intact. No erythema or drainage. Stabilization device present and accurately applied. Pt denies fever or chills. Pt states they have adequate dressing supplies at home.   Return to clinic in one week for already scheduled VAD appointment.

## 2017-04-09 ENCOUNTER — Ambulatory Visit (HOSPITAL_COMMUNITY): Payer: Self-pay | Admitting: Pharmacist

## 2017-04-09 DIAGNOSIS — Z95811 Presence of heart assist device: Secondary | ICD-10-CM

## 2017-04-10 ENCOUNTER — Emergency Department (HOSPITAL_COMMUNITY)
Admission: EM | Admit: 2017-04-10 | Discharge: 2017-04-10 | Disposition: A | Discharge status: Home / Self Care | Payer: Medicare HMO | Source: Home / Self Care | Attending: Emergency Medicine | Admitting: Emergency Medicine

## 2017-04-10 ENCOUNTER — Emergency Department (HOSPITAL_COMMUNITY): Payer: Medicare HMO

## 2017-04-10 ENCOUNTER — Encounter (HOSPITAL_COMMUNITY): Payer: Self-pay

## 2017-04-10 DIAGNOSIS — Z951 Presence of aortocoronary bypass graft: Secondary | ICD-10-CM

## 2017-04-10 DIAGNOSIS — Z7901 Long term (current) use of anticoagulants: Secondary | ICD-10-CM | POA: Insufficient documentation

## 2017-04-10 DIAGNOSIS — Z9049 Acquired absence of other specified parts of digestive tract: Secondary | ICD-10-CM

## 2017-04-10 DIAGNOSIS — Z79899 Other long term (current) drug therapy: Secondary | ICD-10-CM | POA: Insufficient documentation

## 2017-04-10 DIAGNOSIS — R110 Nausea: Secondary | ICD-10-CM | POA: Insufficient documentation

## 2017-04-10 DIAGNOSIS — F329 Major depressive disorder, single episode, unspecified: Secondary | ICD-10-CM

## 2017-04-10 DIAGNOSIS — Z87891 Personal history of nicotine dependence: Secondary | ICD-10-CM

## 2017-04-10 DIAGNOSIS — I5023 Acute on chronic systolic (congestive) heart failure: Secondary | ICD-10-CM

## 2017-04-10 DIAGNOSIS — Z794 Long term (current) use of insulin: Secondary | ICD-10-CM

## 2017-04-10 DIAGNOSIS — F1420 Cocaine dependence, uncomplicated: Secondary | ICD-10-CM

## 2017-04-10 DIAGNOSIS — I2510 Atherosclerotic heart disease of native coronary artery without angina pectoris: Secondary | ICD-10-CM | POA: Insufficient documentation

## 2017-04-10 DIAGNOSIS — Z95810 Presence of automatic (implantable) cardiac defibrillator: Secondary | ICD-10-CM | POA: Insufficient documentation

## 2017-04-10 DIAGNOSIS — E119 Type 2 diabetes mellitus without complications: Secondary | ICD-10-CM | POA: Insufficient documentation

## 2017-04-10 DIAGNOSIS — I110 Hypertensive heart disease with heart failure: Secondary | ICD-10-CM | POA: Insufficient documentation

## 2017-04-10 DIAGNOSIS — J449 Chronic obstructive pulmonary disease, unspecified: Secondary | ICD-10-CM

## 2017-04-10 DIAGNOSIS — E875 Hyperkalemia: Secondary | ICD-10-CM | POA: Insufficient documentation

## 2017-04-10 DIAGNOSIS — R51 Headache: Secondary | ICD-10-CM | POA: Insufficient documentation

## 2017-04-10 LAB — COMPREHENSIVE METABOLIC PANEL
ALBUMIN: 4.3 g/dL (ref 3.5–5.0)
ALT: 36 U/L (ref 17–63)
ANION GAP: 9 (ref 5–15)
AST: 37 U/L (ref 15–41)
Alkaline Phosphatase: 82 U/L (ref 38–126)
BILIRUBIN TOTAL: 0.8 mg/dL (ref 0.3–1.2)
BUN: 33 mg/dL — AB (ref 6–20)
CHLORIDE: 88 mmol/L — AB (ref 101–111)
CO2: 27 mmol/L (ref 22–32)
Calcium: 9.2 mg/dL (ref 8.9–10.3)
Creatinine, Ser: 1.82 mg/dL — ABNORMAL HIGH (ref 0.61–1.24)
GFR calc Af Amer: 45 mL/min — ABNORMAL LOW (ref >60)
GFR calc non Af Amer: 39 mL/min — ABNORMAL LOW (ref >60)
GLUCOSE: 211 mg/dL — AB (ref 65–99)
POTASSIUM: 6.3 mmol/L — AB (ref 3.5–5.1)
SODIUM: 124 mmol/L — AB (ref 135–145)
TOTAL PROTEIN: 8 g/dL (ref 6.5–8.1)

## 2017-04-10 LAB — CBC WITH DIFFERENTIAL/PLATELET
Basophils Absolute: 0 10*3/uL (ref 0.0–0.1)
Basophils Relative: 0 %
Eosinophils Absolute: 0 10*3/uL (ref 0.0–0.7)
Eosinophils Relative: 0 %
HEMATOCRIT: 32.5 % — AB (ref 39.0–52.0)
HEMOGLOBIN: 10.1 g/dL — AB (ref 13.0–17.0)
LYMPHS ABS: 0.7 10*3/uL (ref 0.7–4.0)
LYMPHS PCT: 7 %
MCH: 24 pg — ABNORMAL LOW (ref 26.0–34.0)
MCHC: 31.1 g/dL (ref 30.0–36.0)
MCV: 77.4 fL — ABNORMAL LOW (ref 78.0–100.0)
MONOS PCT: 8 %
Monocytes Absolute: 0.8 10*3/uL (ref 0.1–1.0)
NEUTROS PCT: 85 %
Neutro Abs: 7.9 10*3/uL — ABNORMAL HIGH (ref 1.7–7.7)
Platelets: 210 10*3/uL (ref 150–400)
RBC: 4.2 MIL/uL — ABNORMAL LOW (ref 4.22–5.81)
RDW: 19.5 % — ABNORMAL HIGH (ref 11.5–15.5)
WBC: 9.4 10*3/uL (ref 4.0–10.5)

## 2017-04-10 LAB — URINALYSIS, ROUTINE W REFLEX MICROSCOPIC
Bilirubin Urine: NEGATIVE
GLUCOSE, UA: 50 mg/dL — AB
Hgb urine dipstick: NEGATIVE
Ketones, ur: NEGATIVE mg/dL
LEUKOCYTES UA: NEGATIVE
NITRITE: NEGATIVE
PH: 8 (ref 5.0–8.0)
PROTEIN: NEGATIVE mg/dL
Specific Gravity, Urine: 1.01 (ref 1.005–1.030)

## 2017-04-10 LAB — PROTIME-INR
INR: 2.34
Prothrombin Time: 26 seconds — ABNORMAL HIGH (ref 11.4–15.2)

## 2017-04-10 LAB — I-STAT CHEM 8, ED
BUN: 38 mg/dL — AB (ref 6–20)
CALCIUM ION: 1.05 mmol/L — AB (ref 1.15–1.40)
Chloride: 88 mmol/L — ABNORMAL LOW (ref 101–111)
Creatinine, Ser: 1.8 mg/dL — ABNORMAL HIGH (ref 0.61–1.24)
Glucose, Bld: 213 mg/dL — ABNORMAL HIGH (ref 65–99)
HEMATOCRIT: 36 % — AB (ref 39.0–52.0)
Hemoglobin: 12.2 g/dL — ABNORMAL LOW (ref 13.0–17.0)
Potassium: 6.3 mmol/L (ref 3.5–5.1)
Sodium: 125 mmol/L — ABNORMAL LOW (ref 135–145)
TCO2: 31 mmol/L (ref 0–100)

## 2017-04-10 LAB — CBG MONITORING, ED: Glucose-Capillary: 246 mg/dL — ABNORMAL HIGH (ref 65–99)

## 2017-04-10 MED ORDER — SODIUM POLYSTYRENE SULFONATE 15 GM/60ML PO SUSP
15.0000 g | Freq: Once | ORAL | Status: DC
  Filled 2017-04-10: qty 60

## 2017-04-10 MED ORDER — ONDANSETRON HCL 4 MG/2ML IJ SOLN
4.0000 mg | Freq: Once | INTRAMUSCULAR | Status: AC
  Administered 2017-04-10: 4 mg via INTRAVENOUS
  Filled 2017-04-10: qty 2

## 2017-04-10 MED ORDER — METOCLOPRAMIDE HCL 5 MG/ML IJ SOLN
10.0000 mg | Freq: Once | INTRAMUSCULAR | Status: AC
  Administered 2017-04-10: 10 mg via INTRAVENOUS
  Filled 2017-04-10: qty 2

## 2017-04-10 MED ORDER — ONDANSETRON 4 MG PO TBDP: 4.0000 mg | ORAL_TABLET | Freq: Three times a day (TID) | ORAL | 0 refills | Status: DC | PRN

## 2017-04-10 NOTE — ED Triage Notes (Signed)
Patient complains of shortness of breath since am with 2 episodes of hypoglycemia. Reports that he has eaten pta and last BS was greater than 200. Alert and oriented, denies CP

## 2017-04-10 NOTE — Discharge Instructions (Signed)
Hold Potasium and Spironolactone tonight and tomorrow morning. Take kayexolate tonight. Dr. Edwena Bunde office will contact you tomorrow.

## 2017-04-10 NOTE — ED Notes (Signed)
Second CMP hemolyzed per lab tech; second sample sent

## 2017-04-10 NOTE — ED Notes (Signed)
CMP hemolyzed; repeat cmp sent to lab

## 2017-04-10 NOTE — ED Notes (Signed)
Manual Blood Pressure 86 with doppler

## 2017-04-10 NOTE — ED Notes (Signed)
Pt understood dc material. NAD Noted 

## 2017-04-10 NOTE — ED Notes (Signed)
Dr.James aware that CMP again hemolyzed; chem 8 orderd phlebotomy at bedside for recollect

## 2017-04-10 NOTE — ED Notes (Signed)
RN scanned medication to give patient. Pt states that he did not want the medication. RN explained what the medication was for. Pt still declined

## 2017-04-11 ENCOUNTER — Ambulatory Visit: Payer: Self-pay | Admitting: Internal Medicine

## 2017-04-11 ENCOUNTER — Ambulatory Visit: Payer: Self-pay

## 2017-04-11 ENCOUNTER — Telehealth: Payer: Self-pay

## 2017-04-11 ENCOUNTER — Other Ambulatory Visit (HOSPITAL_COMMUNITY): Payer: Self-pay | Admitting: *Deleted

## 2017-04-11 ENCOUNTER — Ambulatory Visit (HOSPITAL_COMMUNITY)
Admission: RE | Admit: 2017-04-11 | Discharge: 2017-04-11 | Disposition: A | Discharge status: Home / Self Care | Payer: Medicare HMO | Source: Ambulatory Visit | Attending: Internal Medicine | Admitting: Internal Medicine

## 2017-04-11 ENCOUNTER — Other Ambulatory Visit (HOSPITAL_COMMUNITY): Payer: Medicare HMO

## 2017-04-11 ENCOUNTER — Inpatient Hospital Stay (HOSPITAL_COMMUNITY)
Admission: AD | Admit: 2017-04-11 | Discharge: 2017-04-16 | DRG: 880 | Disposition: A | Discharge status: Skilled Nursing Facility | Payer: Medicare HMO | Source: Ambulatory Visit | Attending: Internal Medicine | Admitting: Internal Medicine

## 2017-04-11 DIAGNOSIS — F05 Delirium due to known physiological condition: Principal | ICD-10-CM | POA: Diagnosis present

## 2017-04-11 DIAGNOSIS — I5022 Chronic systolic (congestive) heart failure: Secondary | ICD-10-CM | POA: Diagnosis present

## 2017-04-11 DIAGNOSIS — Z7901 Long term (current) use of anticoagulants: Secondary | ICD-10-CM

## 2017-04-11 DIAGNOSIS — R4182 Altered mental status, unspecified: Secondary | ICD-10-CM | POA: Diagnosis present

## 2017-04-11 DIAGNOSIS — Z95810 Presence of automatic (implantable) cardiac defibrillator: Secondary | ICD-10-CM | POA: Diagnosis not present

## 2017-04-11 DIAGNOSIS — F1421 Cocaine dependence, in remission: Secondary | ICD-10-CM | POA: Diagnosis present

## 2017-04-11 DIAGNOSIS — Z95811 Presence of heart assist device: Secondary | ICD-10-CM

## 2017-04-11 DIAGNOSIS — I2510 Atherosclerotic heart disease of native coronary artery without angina pectoris: Secondary | ICD-10-CM | POA: Diagnosis present

## 2017-04-11 DIAGNOSIS — I110 Hypertensive heart disease with heart failure: Secondary | ICD-10-CM | POA: Diagnosis present

## 2017-04-11 DIAGNOSIS — Z794 Long term (current) use of insulin: Secondary | ICD-10-CM

## 2017-04-11 DIAGNOSIS — F419 Anxiety disorder, unspecified: Secondary | ICD-10-CM | POA: Diagnosis present

## 2017-04-11 DIAGNOSIS — Z87891 Personal history of nicotine dependence: Secondary | ICD-10-CM

## 2017-04-11 DIAGNOSIS — Z951 Presence of aortocoronary bypass graft: Secondary | ICD-10-CM | POA: Diagnosis not present

## 2017-04-11 DIAGNOSIS — R32 Unspecified urinary incontinence: Secondary | ICD-10-CM | POA: Diagnosis present

## 2017-04-11 DIAGNOSIS — Z7982 Long term (current) use of aspirin: Secondary | ICD-10-CM

## 2017-04-11 DIAGNOSIS — R410 Disorientation, unspecified: Secondary | ICD-10-CM | POA: Diagnosis not present

## 2017-04-11 DIAGNOSIS — J449 Chronic obstructive pulmonary disease, unspecified: Secondary | ICD-10-CM | POA: Diagnosis present

## 2017-04-11 DIAGNOSIS — Z9889 Other specified postprocedural states: Secondary | ICD-10-CM | POA: Diagnosis not present

## 2017-04-11 DIAGNOSIS — F064 Anxiety disorder due to known physiological condition: Secondary | ICD-10-CM | POA: Diagnosis not present

## 2017-04-11 DIAGNOSIS — Z8249 Family history of ischemic heart disease and other diseases of the circulatory system: Secondary | ICD-10-CM

## 2017-04-11 DIAGNOSIS — K7290 Hepatic failure, unspecified without coma: Secondary | ICD-10-CM | POA: Diagnosis not present

## 2017-04-11 DIAGNOSIS — E875 Hyperkalemia: Secondary | ICD-10-CM | POA: Diagnosis present

## 2017-04-11 DIAGNOSIS — F329 Major depressive disorder, single episode, unspecified: Secondary | ICD-10-CM | POA: Diagnosis present

## 2017-04-11 DIAGNOSIS — F4321 Adjustment disorder with depressed mood: Secondary | ICD-10-CM | POA: Diagnosis not present

## 2017-04-11 DIAGNOSIS — Z720 Tobacco use: Secondary | ICD-10-CM

## 2017-04-11 DIAGNOSIS — Z888 Allergy status to other drugs, medicaments and biological substances status: Secondary | ICD-10-CM | POA: Diagnosis not present

## 2017-04-11 DIAGNOSIS — Z9049 Acquired absence of other specified parts of digestive tract: Secondary | ICD-10-CM

## 2017-04-11 DIAGNOSIS — I480 Paroxysmal atrial fibrillation: Secondary | ICD-10-CM | POA: Diagnosis present

## 2017-04-11 DIAGNOSIS — Z7682 Awaiting organ transplant status: Secondary | ICD-10-CM

## 2017-04-11 DIAGNOSIS — F1490 Cocaine use, unspecified, uncomplicated: Secondary | ICD-10-CM | POA: Diagnosis not present

## 2017-04-11 DIAGNOSIS — Z79899 Other long term (current) drug therapy: Secondary | ICD-10-CM

## 2017-04-11 DIAGNOSIS — Z833 Family history of diabetes mellitus: Secondary | ICD-10-CM

## 2017-04-11 DIAGNOSIS — I5023 Acute on chronic systolic (congestive) heart failure: Secondary | ICD-10-CM | POA: Diagnosis not present

## 2017-04-11 DIAGNOSIS — E871 Hypo-osmolality and hyponatremia: Secondary | ICD-10-CM | POA: Diagnosis present

## 2017-04-11 DIAGNOSIS — E876 Hypokalemia: Secondary | ICD-10-CM | POA: Diagnosis present

## 2017-04-11 DIAGNOSIS — D649 Anemia, unspecified: Secondary | ICD-10-CM | POA: Diagnosis present

## 2017-04-11 DIAGNOSIS — I5082 Biventricular heart failure: Secondary | ICD-10-CM | POA: Diagnosis present

## 2017-04-11 DIAGNOSIS — R278 Other lack of coordination: Secondary | ICD-10-CM | POA: Diagnosis present

## 2017-04-11 DIAGNOSIS — E11649 Type 2 diabetes mellitus with hypoglycemia without coma: Secondary | ICD-10-CM | POA: Diagnosis present

## 2017-04-11 LAB — CBC
HEMATOCRIT: 33.6 % — AB (ref 39.0–52.0)
HEMOGLOBIN: 10.3 g/dL — AB (ref 13.0–17.0)
MCH: 23.8 pg — AB (ref 26.0–34.0)
MCHC: 30.7 g/dL (ref 30.0–36.0)
MCV: 77.8 fL — AB (ref 78.0–100.0)
Platelets: 215 10*3/uL (ref 150–400)
RBC: 4.32 MIL/uL (ref 4.22–5.81)
RDW: 19.5 % — ABNORMAL HIGH (ref 11.5–15.5)
WBC: 8.4 10*3/uL (ref 4.0–10.5)

## 2017-04-11 LAB — GLUCOSE, CAPILLARY
GLUCOSE-CAPILLARY: 252 mg/dL — AB (ref 65–99)
GLUCOSE-CAPILLARY: 90 mg/dL (ref 65–99)

## 2017-04-11 LAB — RAPID URINE DRUG SCREEN, HOSP PERFORMED
Amphetamines: NOT DETECTED
Barbiturates: NOT DETECTED
Benzodiazepines: NOT DETECTED
COCAINE: NOT DETECTED
OPIATES: NOT DETECTED
TETRAHYDROCANNABINOL: NOT DETECTED

## 2017-04-11 LAB — COMPREHENSIVE METABOLIC PANEL
ALBUMIN: 4.4 g/dL (ref 3.5–5.0)
ALT: 38 U/L (ref 17–63)
ANION GAP: 11 (ref 5–15)
AST: 38 U/L (ref 15–41)
Alkaline Phosphatase: 81 U/L (ref 38–126)
BUN: 38 mg/dL — ABNORMAL HIGH (ref 6–20)
CHLORIDE: 87 mmol/L — AB (ref 101–111)
CO2: 27 mmol/L (ref 22–32)
Calcium: 9.3 mg/dL (ref 8.9–10.3)
Creatinine, Ser: 1.92 mg/dL — ABNORMAL HIGH (ref 0.61–1.24)
GFR calc Af Amer: 42 mL/min — ABNORMAL LOW (ref >60)
GFR calc non Af Amer: 36 mL/min — ABNORMAL LOW (ref >60)
GLUCOSE: 235 mg/dL — AB (ref 65–99)
POTASSIUM: 4.5 mmol/L (ref 3.5–5.1)
SODIUM: 125 mmol/L — AB (ref 135–145)
TOTAL PROTEIN: 8.3 g/dL — AB (ref 6.5–8.1)
Total Bilirubin: 1.2 mg/dL (ref 0.3–1.2)

## 2017-04-11 LAB — PROTIME-INR
INR: 2.25
Prothrombin Time: 25.2 seconds — ABNORMAL HIGH (ref 11.4–15.2)

## 2017-04-11 LAB — AMMONIA: Ammonia: 32 umol/L (ref 9–35)

## 2017-04-11 LAB — ETHANOL: Alcohol, Ethyl (B): <5 mg/dL (ref <5)

## 2017-04-11 LAB — LACTATE DEHYDROGENASE: LDH: 251 U/L — ABNORMAL HIGH (ref 98–192)

## 2017-04-11 LAB — MRSA PCR SCREENING: MRSA BY PCR: NEGATIVE

## 2017-04-11 MED ORDER — ONDANSETRON HCL 4 MG/2ML IJ SOLN
4.0000 mg | Freq: Four times a day (QID) | INTRAMUSCULAR | Status: DC | PRN
  Administered 2017-04-12: 4 mg via INTRAVENOUS
  Filled 2017-04-11: qty 2

## 2017-04-11 MED ORDER — DOBUTAMINE IN D5W 4-5 MG/ML-% IV SOLN
5.0000 ug/kg/min | INTRAVENOUS | Status: DC
Start: 2017-04-11 — End: 2017-04-12
  Administered 2017-04-11: 5 ug/kg/min via INTRAVENOUS
  Filled 2017-04-11: qty 250

## 2017-04-11 MED ORDER — DOCUSATE SODIUM 100 MG PO CAPS
200.0000 mg | ORAL_CAPSULE | Freq: Every day | ORAL | Status: DC
  Administered 2017-04-11 – 2017-04-15 (×5): 200 mg via ORAL
  Filled 2017-04-11 (×5): qty 2

## 2017-04-11 MED ORDER — SILDENAFIL CITRATE 20 MG PO TABS
40.0000 mg | ORAL_TABLET | Freq: Three times a day (TID) | ORAL | Status: DC
  Administered 2017-04-11 – 2017-04-16 (×15): 40 mg via ORAL
  Filled 2017-04-11 (×16): qty 2

## 2017-04-11 MED ORDER — INSULIN ASPART 100 UNIT/ML ~~LOC~~ SOLN
10.0000 [IU] | Freq: Three times a day (TID) | SUBCUTANEOUS | Status: DC
  Administered 2017-04-11 – 2017-04-16 (×15): 10 [IU] via SUBCUTANEOUS
  Filled 2017-04-11 (×21): qty 0.1

## 2017-04-11 MED ORDER — INSULIN ASPART 100 UNIT/ML FLEXPEN: 10.0000 [IU] | PEN_INJECTOR | Freq: Three times a day (TID) | SUBCUTANEOUS | Status: DC

## 2017-04-11 MED ORDER — SODIUM CHLORIDE 0.9% FLUSH
10.0000 mL | Freq: Two times a day (BID) | INTRAVENOUS | Status: DC
  Administered 2017-04-11 – 2017-04-16 (×5): 10 mL

## 2017-04-11 MED ORDER — DIGOXIN 125 MCG PO TABS: 0.2500 mg | ORAL_TABLET | Freq: Every day | ORAL | Status: DC

## 2017-04-11 MED ORDER — MAGNESIUM OXIDE 400 (241.3 MG) MG PO TABS
400.0000 mg | ORAL_TABLET | Freq: Every day | ORAL | Status: DC
  Administered 2017-04-11 – 2017-04-16 (×6): 400 mg via ORAL
  Filled 2017-04-11 (×6): qty 1

## 2017-04-11 MED ORDER — AMIODARONE HCL 200 MG PO TABS
200.0000 mg | ORAL_TABLET | Freq: Every day | ORAL | Status: DC
Start: 2017-04-11 — End: 2017-04-16
  Administered 2017-04-11 – 2017-04-16 (×6): 200 mg via ORAL
  Filled 2017-04-11 (×6): qty 1

## 2017-04-11 MED ORDER — WARFARIN SODIUM 7.5 MG PO TABS
15.0000 mg | ORAL_TABLET | Freq: Once | ORAL | Status: AC
  Administered 2017-04-11: 15 mg via ORAL
  Filled 2017-04-11: qty 2

## 2017-04-11 MED ORDER — ACETAMINOPHEN 325 MG PO TABS
650.0000 mg | ORAL_TABLET | ORAL | Status: DC | PRN
  Administered 2017-04-12 – 2017-04-14 (×3): 650 mg via ORAL
  Filled 2017-04-11 (×4): qty 2

## 2017-04-11 MED ORDER — METOLAZONE 2.5 MG PO TABS
2.5000 mg | ORAL_TABLET | Freq: Once | ORAL | Status: AC
  Administered 2017-04-11: 2.5 mg via ORAL
  Filled 2017-04-11: qty 1

## 2017-04-11 MED ORDER — ALTEPLASE 2 MG IJ SOLR
2.0000 mg | Freq: Once | INTRAMUSCULAR | Status: AC
  Administered 2017-04-11: 2 mg

## 2017-04-11 MED ORDER — TORSEMIDE 20 MG PO TABS
80.0000 mg | ORAL_TABLET | Freq: Two times a day (BID) | ORAL | Status: DC
  Administered 2017-04-11: 80 mg via ORAL
  Filled 2017-04-11 (×2): qty 4

## 2017-04-11 MED ORDER — INSULIN GLARGINE 100 UNIT/ML SOLOSTAR PEN: 20.0000 [IU] | PEN_INJECTOR | Freq: Every day | SUBCUTANEOUS | Status: DC

## 2017-04-11 MED ORDER — GABAPENTIN 100 MG PO CAPS
200.0000 mg | ORAL_CAPSULE | Freq: Two times a day (BID) | ORAL | Status: DC
  Administered 2017-04-11 – 2017-04-16 (×10): 200 mg via ORAL
  Filled 2017-04-11 (×11): qty 2

## 2017-04-11 MED ORDER — SENNOSIDES-DOCUSATE SODIUM 8.6-50 MG PO TABS
2.0000 | ORAL_TABLET | ORAL | Status: DC
  Administered 2017-04-11 – 2017-04-15 (×3): 2 via ORAL
  Filled 2017-04-11 (×4): qty 2

## 2017-04-11 MED ORDER — INSULIN ASPART 100 UNIT/ML ~~LOC~~ SOLN
0.0000 [IU] | Freq: Three times a day (TID) | SUBCUTANEOUS | Status: DC
  Administered 2017-04-11: 11 [IU] via SUBCUTANEOUS
  Administered 2017-04-13: 3 [IU] via SUBCUTANEOUS
  Administered 2017-04-13: 4 [IU] via SUBCUTANEOUS
  Administered 2017-04-13: 3 [IU] via SUBCUTANEOUS
  Administered 2017-04-14 (×2): 4 [IU] via SUBCUTANEOUS
  Administered 2017-04-14: 3 [IU] via SUBCUTANEOUS
  Administered 2017-04-15: 7 [IU] via SUBCUTANEOUS
  Administered 2017-04-15: 4 [IU] via SUBCUTANEOUS
  Administered 2017-04-15 – 2017-04-16 (×3): 3 [IU] via SUBCUTANEOUS
  Filled 2017-04-11 (×21): qty 0.2

## 2017-04-11 MED ORDER — CHLORHEXIDINE GLUCONATE CLOTH 2 % EX PADS
6.0000 | MEDICATED_PAD | Freq: Every day | CUTANEOUS | Status: DC
  Administered 2017-04-12 – 2017-04-16 (×3): 6 via TOPICAL

## 2017-04-11 MED ORDER — ASPIRIN EC 81 MG PO TBEC
81.0000 mg | DELAYED_RELEASE_TABLET | Freq: Every day | ORAL | Status: DC
  Administered 2017-04-11 – 2017-04-16 (×6): 81 mg via ORAL
  Filled 2017-04-11 (×6): qty 1

## 2017-04-11 MED ORDER — SODIUM CHLORIDE 0.9% FLUSH: 10.0000 mL | INTRAVENOUS | Status: DC | PRN

## 2017-04-11 MED ORDER — ALTEPLASE 2 MG IJ SOLR: 2.0000 mg | Freq: Once | INTRAMUSCULAR | Status: AC

## 2017-04-11 MED ORDER — WARFARIN - PHARMACIST DOSING INPATIENT
Freq: Every day | Status: DC
  Administered 2017-04-11 – 2017-04-15 (×2)

## 2017-04-11 MED ORDER — PANTOPRAZOLE SODIUM 40 MG PO TBEC
40.0000 mg | DELAYED_RELEASE_TABLET | Freq: Every day | ORAL | Status: DC
  Administered 2017-04-11 – 2017-04-16 (×6): 40 mg via ORAL
  Filled 2017-04-11 (×6): qty 1

## 2017-04-11 MED ORDER — CITALOPRAM HYDROBROMIDE 10 MG PO TABS
10.0000 mg | ORAL_TABLET | Freq: Every day | ORAL | Status: DC
  Administered 2017-04-11 – 2017-04-13 (×3): 10 mg via ORAL
  Filled 2017-04-11 (×3): qty 1

## 2017-04-11 MED ORDER — ONDANSETRON 4 MG PO TBDP: 4.0000 mg | ORAL_TABLET | Freq: Three times a day (TID) | ORAL | Status: DC | PRN

## 2017-04-11 MED ORDER — ASPIRIN 81 MG PO TBEC: 81.0000 mg | DELAYED_RELEASE_TABLET | Freq: Every day | ORAL | Status: DC

## 2017-04-11 MED ORDER — INSULIN GLARGINE 100 UNIT/ML ~~LOC~~ SOLN
20.0000 [IU] | Freq: Every day | SUBCUTANEOUS | Status: DC
  Administered 2017-04-11 – 2017-04-15 (×4): 20 [IU] via SUBCUTANEOUS
  Filled 2017-04-11 (×6): qty 0.2

## 2017-04-11 NOTE — Telephone Encounter (Signed)
Leslie from [facility] called and asked if we could draw a BMET on the pt while in the office today, she has placed the order.

## 2017-04-11 NOTE — Telephone Encounter (Signed)
Will do.  Please make sure patient gets lab draw before leaving

## 2017-04-11 NOTE — Telephone Encounter (Signed)
Please advise 

## 2017-04-11 NOTE — Telephone Encounter (Signed)
Pt was admitted into hospital last night.
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
## 2017-04-11 NOTE — Progress Notes (Signed)
ANTICOAGULATION CONSULT NOTE - Initial Consult  Pharmacy Consult for coumadin Indication: LVAD  Allergies  Allergen Reactions  . Codeine Nausea And Vomiting and Other (See Comments)    In high doses  . Trazodone And Nefazodone Other (See Comments)    Dizziness with subsequent fall   . Lipitor [Atorvastatin] Nausea Only and Other (See Comments)    Nausea with high doses, tolerates 20mg  dose (08/22/16)  . Tape Other (See Comments)    Paper tape is ok    Patient Measurements: Height: 5\' 9"  (175.3 cm) Weight: 210 lb 8.6 oz (95.5 kg) IBW/kg (Calculated) : 70.7  Vital Signs: Temp: 97.8 F (36.6 C) (07/09 1306) Temp Source: Oral (07/09 1306) BP: 116/98 (07/09 1306)  Labs:  Recent Labs  04/10/17 1920 04/10/17 2140 04/10/17 2150 04/11/17 1129  HGB 10.1*  --  12.2* 10.3*  HCT 32.5*  --  36.0* 33.6*  PLT 210  --   --  215  LABPROT 26.0*  --   --  25.2*  INR 2.34  --   --  2.25  CREATININE  --  1.82* 1.80* 1.92*    Estimated Creatinine Clearance: 46.6 mL/min (A) (by C-G formula based on SCr of 1.92 mg/dL (H)).   Medical History: Past Medical History:  Diagnosis Date  . AICD (automatic cardioverter/defibrillator) present   . ASCVD (arteriosclerotic cardiovascular disease)   . Chronic systolic CHF (congestive heart failure) ([place])   . COPD (chronic obstructive pulmonary disease) ([place])   . Coronary artery disease   . Depression   . Diabetes mellitus   . History of cocaine abuse   . Hypertension   . Presence of permanent cardiac pacemaker   . Shortness of breath dyspnea   . Suicidal ideation   . Tobacco abuse      Assessment: 61 yo male here with AMS. He is noted with an LVAD and pharmacy consulted to dose coumadin. -INR = 2.25  Coumadin home dose: 15mg /day except 10mg  on Tues (last clinic visit 7/7). He has not taken his coumadin today  Goal of Therapy:  INR= 2-2.5 Monitor platelets by anticoagulation protocol: Yes   Plan:  -Coumadin 15mg  po x1 -Daily  PT/INR  Hildred Laser, Pharm D 04/11/2017 1:31 PM

## 2017-04-11 NOTE — Progress Notes (Signed)
[facility]  Active pt with [facility] and pharmacy for home Dobutamine.   [facility] team will follow and support DC home when ordered.  If patient discharges after hours, please call [phone].   [name] 04/11/2017, 4:07 PM

## 2017-04-12 ENCOUNTER — Encounter (HOSPITAL_COMMUNITY): Payer: Self-pay

## 2017-04-12 ENCOUNTER — Encounter (INDEPENDENT_AMBULATORY_CARE_PROVIDER_SITE_OTHER): Payer: Self-pay | Admitting: Cardiovascular Disease

## 2017-04-12 DIAGNOSIS — K7290 Hepatic failure, unspecified without coma: Secondary | ICD-10-CM

## 2017-04-12 DIAGNOSIS — E876 Hypokalemia: Secondary | ICD-10-CM

## 2017-04-12 LAB — CBC
HCT: 36.2 % — ABNORMAL LOW (ref 39.0–52.0)
Hemoglobin: 10.9 g/dL — ABNORMAL LOW (ref 13.0–17.0)
MCH: 23.6 pg — ABNORMAL LOW (ref 26.0–34.0)
MCHC: 30.1 g/dL (ref 30.0–36.0)
MCV: 78.4 fL (ref 78.0–100.0)
Platelets: 230 10*3/uL (ref 150–400)
RBC: 4.62 MIL/uL (ref 4.22–5.81)
RDW: 19.7 % — AB (ref 11.5–15.5)
WBC: 9 10*3/uL (ref 4.0–10.5)

## 2017-04-12 LAB — GLUCOSE, CAPILLARY
GLUCOSE-CAPILLARY: 94 mg/dL (ref 65–99)
GLUCOSE-CAPILLARY: 88 mg/dL (ref 65–99)
GLUCOSE-CAPILLARY: 117 mg/dL — AB (ref 65–99)
GLUCOSE-CAPILLARY: 113 mg/dL — AB (ref 65–99)
Glucose-Capillary: 235 mg/dL — ABNORMAL HIGH (ref 65–99)

## 2017-04-12 LAB — COOXEMETRY PANEL
Carboxyhemoglobin: 1.4 % (ref 0.5–1.5)
Carboxyhemoglobin: 1.7 % — ABNORMAL HIGH (ref 0.5–1.5)
Carboxyhemoglobin: 1.7 % — ABNORMAL HIGH (ref 0.5–1.5)
METHEMOGLOBIN: 1.3 % (ref 0.0–1.5)
Methemoglobin: 0.9 % (ref 0.0–1.5)
Methemoglobin: 1.4 % (ref 0.0–1.5)
O2 Saturation: 43.3 %
O2 Saturation: 48.5 %
O2 Saturation: 60.8 %
TOTAL HEMOGLOBIN: 10.8 g/dL — AB (ref 12.0–16.0)
TOTAL HEMOGLOBIN: 10.8 g/dL — AB (ref 12.0–16.0)
Total hemoglobin: 9.9 g/dL — ABNORMAL LOW (ref 12.0–16.0)

## 2017-04-12 LAB — PROTIME-INR
INR: 1.94
PROTHROMBIN TIME: 22.4 s — AB (ref 11.4–15.2)

## 2017-04-12 LAB — POTASSIUM
POTASSIUM: 2.9 mmol/L — AB (ref 3.5–5.1)
POTASSIUM: 3.8 mmol/L (ref 3.5–5.1)

## 2017-04-12 LAB — DIGOXIN LEVEL: DIGOXIN LVL: 0.8 ng/mL (ref 0.8–2.0)

## 2017-04-12 LAB — MAGNESIUM
MAGNESIUM: 2.1 mg/dL (ref 1.7–2.4)
MAGNESIUM: 1.9 mg/dL (ref 1.7–2.4)

## 2017-04-12 LAB — BASIC METABOLIC PANEL
Anion gap: 13 (ref 5–15)
BUN: 33 mg/dL — ABNORMAL HIGH (ref 6–20)
CALCIUM: 9.6 mg/dL (ref 8.9–10.3)
CO2: 32 mmol/L (ref 22–32)
CREATININE: 1.75 mg/dL — AB (ref 0.61–1.24)
Chloride: 86 mmol/L — ABNORMAL LOW (ref 101–111)
GFR calc non Af Amer: 41 mL/min — ABNORMAL LOW (ref >60)
GFR, EST AFRICAN AMERICAN: 47 mL/min — AB (ref >60)
Glucose, Bld: 105 mg/dL — ABNORMAL HIGH (ref 65–99)
Potassium: 3.2 mmol/L — ABNORMAL LOW (ref 3.5–5.1)
SODIUM: 131 mmol/L — AB (ref 135–145)

## 2017-04-12 LAB — AMMONIA: Ammonia: 29 umol/L (ref 9–35)

## 2017-04-12 LAB — LACTATE DEHYDROGENASE: LDH: 233 U/L — ABNORMAL HIGH (ref 98–192)

## 2017-04-12 MED ORDER — WARFARIN SODIUM 4 MG PO TABS
15.0000 mg | ORAL_TABLET | Freq: Once | ORAL | Status: AC
  Administered 2017-04-12: 19:00:00 15 mg via ORAL
  Filled 2017-04-12: qty 1

## 2017-04-12 MED ORDER — POTASSIUM CHLORIDE 10 MEQ/100ML IV SOLN
10.0000 meq | Freq: Once | INTRAVENOUS | Status: AC
  Administered 2017-04-12: 10 meq via INTRAVENOUS

## 2017-04-12 MED ORDER — POTASSIUM CHLORIDE 10 MEQ/100ML IV SOLN
10.0000 meq | INTRAVENOUS | Status: DC
  Filled 2017-04-12: qty 100

## 2017-04-12 MED ORDER — POTASSIUM CHLORIDE CRYS ER 20 MEQ PO TBCR
40.0000 meq | EXTENDED_RELEASE_TABLET | Freq: Once | ORAL | Status: AC
  Administered 2017-04-12: 40 meq via ORAL
  Filled 2017-04-12: qty 2

## 2017-04-12 MED ORDER — SODIUM CHLORIDE 0.9 % IV BOLUS (SEPSIS)
250.0000 mL | Freq: Once | INTRAVENOUS | Status: AC
  Administered 2017-04-12: 250 mL via INTRAVENOUS

## 2017-04-12 MED ORDER — SODIUM CHLORIDE 0.9 % IV SOLN
1.0000 g | Freq: Once | INTRAVENOUS | Status: AC
  Administered 2017-04-12: 1 g via INTRAVENOUS
  Filled 2017-04-12: qty 10

## 2017-04-12 MED ORDER — POTASSIUM CHLORIDE CRYS ER 20 MEQ PO TBCR
40.0000 meq | EXTENDED_RELEASE_TABLET | Freq: Two times a day (BID) | ORAL | Status: AC
  Administered 2017-04-12 (×2): 40 meq via ORAL
  Filled 2017-04-12 (×2): qty 2

## 2017-04-12 MED ORDER — DOBUTAMINE IN D5W 4-5 MG/ML-% IV SOLN
6.0000 ug/kg/min | INTRAVENOUS | Status: DC
  Administered 2017-04-13 – 2017-04-15 (×3): 6 ug/kg/min via INTRAVENOUS
  Filled 2017-04-12 (×3): qty 250

## 2017-04-12 NOTE — Progress Notes (Signed)
CARDIAC REHAB PHASE I   PRE:  Rate/Rhythm: 87        SaO2: 96 RA  MODE:  Ambulation: 300 ft   POST:  Rate/Rhythm: 87  BP:  Sitting: 116/80         SaO2: 95 RA   Pt sitting on edge of bed, preparing to transfer to 2C, anxious but agreeable to walk to new room, intermittently tearful. Pt ambulated 300 ft on RA, IV, assist x2, pt pushed wheelchair, slow, mostly steady gait, tolerated well with no complaints. Pt did seem to have some mild confusion at times, had difficulty managing batteries, placing them in vest, assistance provided. Pt to recliner after walk, bathroom assistance provided, oriented to new room 2C09, placed on telemetry, call bell within reach. Will follow.      [id number] Lenna Sciara, RN, BSN 04/12/2017 2:18 PM

## 2017-04-12 NOTE — Progress Notes (Signed)
Inpatient Diabetes Program Recommendations  AACE/ADA: New Consensus Statement on Inpatient Glycemic Control (2015)  Target Ranges:  Prepandial:   less than 140 mg/dL      Peak postprandial:   less than 180 mg/dL (1-2 hours)      Critically ill patients:  140 - 180 mg/dL   Lab Results  Component Value Date   GLUCAP 88 04/12/2017   HGBA1C 7.1 (H) 03/05/2017    Review of Glycemic Control Results for [name], GROW (MRN [id number]) as of 04/12/2017 10:04  Ref. Range 04/10/2017 19:17 04/11/2017 17:30 04/11/2017 21:02 04/12/2017 06:55 04/12/2017 08:24  Glucose-Capillary Latest Ref Range: 65 - 99 mg/dL 246 (H) 252 (H) 90 94 88   Diabetes history: DM2 Outpatient Diabetes medications: Lantus 20 units+ Nov 10 units tid Current orders for Inpatient glycemic control: Lantus 20 units+ Nov 10 units tid + Novolog correction 0-20 units tid  Inpatient Diabetes Program Recommendations:  Noted fasting CBG 88. Please consider: -Decrease Lantus to 15 units -Decrease Novolog meal coverage to 5 if eats 50% -Decrease Novolog correction to 0-15 units tid + 0-5 units hs  Thank you, Ronald Roys E. Mykai Wendorf, RN, MSN, CDE  Diabetes Coordinator Inpatient Glycemic Control Team Team Pager [phone] (8am-5pm) 04/12/2017 10:07 AM

## 2017-04-12 NOTE — Care Management Note (Signed)
Case Management Note Marvetta Gibbons RN, BSN Unit 2W-Case Manager- [place] coverage [phone]  Patient Details  Name: Ronald Miller. MRN: [id number] Date of Birth: [birth year]  Subjective/Objective:  LVAD pt- admitted with AMS- confusion                   Action/Plan: PTA pt lived at home- active/drives- was active with [facility] for [facility] services for RN-and home IV Dobutamine- PT eval pending- if pt returns home will need resumption orders for discharge and IV needs. - CM to follow  Expected Discharge Date:                  Expected Discharge Plan:  [place]  In-House Referral:     Discharge planning Services  CM Consult  Post Acute Care Choice:  [place], Resumption of Svcs/PTA Provider Choice offered to:  Patient  DME Arranged:    DME Agency:     HH Arranged:  RN, Disease Management [place] Agency:  [place]  Status of Service:  In process, will continue to follow  If discussed at Long Length of Stay Meetings, dates discussed:    Discharge Disposition:   Additional Comments:  Dawayne Patricia, RN 04/12/2017, 11:25 AM

## 2017-04-12 NOTE — Progress Notes (Signed)
Advanced Heart Failure VAD Team Note  Subjective:    Admitted with AMS. UDS and ammonia ok. No ETOH. Continues on Dobutamine 6 mcg.     CO-OX repeat this morning. 48>61%. Weight below baseline.   Overall feeling ok. Complaining of leg cramps.    LVAD INTERROGATION:  HeartMate II LVAD:  Flow 5.8 liters/min, speed 5900, power 5, PI 2.7  22 PI events.   Objective:    Vital Signs:   Temp:  [97.5 F (36.4 C)-98.2 F (36.8 C)] 97.6 F (36.4 C) (07/10 0825) Pulse Rate:  [32-134] 79 (07/10 0825) Resp:  [14-22] 17 (07/10 0825) BP: (83-129)/(62-104) 84/72 (07/10 0825) SpO2:  [89 %-100 %] 95 % (07/10 0825) Weight:  [94.2 kg (207 lb 10.8 oz)-95.5 kg (210 lb 8.6 oz)] 94.2 kg (207 lb 10.8 oz) (07/10 0425) Last BM Date: 04/11/17 Mean arterial Pressure 70s   Intake/Output:   Intake/Output Summary (Last 24 hours) at 04/12/17 0833 Last data filed at 04/12/17 0600  Gross per 24 hour  Intake          1039.32 ml  Output             3025 ml  Net         -1985.68 ml     Physical Exam   CVP 3.  General:  Appear fatigued.  No resp difficulty. In bed  HEENT: normal Neck: supple. JVP 5-6 . Carotids 2+ bilat; no bruits. No lymphadenopathy or thyromegaly appreciated. Cor: Mechanical heart sounds with LVAD hum present. R Upper Chest. Tunneled catheter Lungs: clear Abdomen: soft, nontender, nondistended. No hepatosplenomegaly. No bruits or masses. Good bowel sounds. Driveline: C/D/I; securement device intact and driveline incorporated Extremities: no cyanosis, clubbing, rash, edema Neuro: alert & orientedx3, cranial nerves grossly intact. moves all 4 extremities w/o difficulty. Affect pleasant   Telemetry  V paced   EKG    V Paced 84   Labs   Basic Metabolic Panel:  Recent Labs Lab 04/10/17 2140 04/10/17 2150 04/11/17 1129 04/12/17 0156  NA 124* 125* 125*  --   K 6.3* 6.3* 4.5 2.9*  CL 88* 88* 87*  --   CO2 27  --  27  --   GLUCOSE 211* 213* 235*  --   BUN 33* 38* 38*   --   CREATININE 1.82* 1.80* 1.92*  --   CALCIUM 9.2  --  9.3  --   MG  --   --   --  2.1    Liver Function Tests:  Recent Labs Lab 04/10/17 2140 04/11/17 1129  AST 37 38  ALT 36 38  ALKPHOS 82 81  BILITOT 0.8 1.2  PROT 8.0 8.3*  ALBUMIN 4.3 4.4   No results for input(s): LIPASE, AMYLASE in the last 168 hours.  Recent Labs Lab 04/11/17 1129  AMMONIA 32    CBC:  Recent Labs Lab 04/10/17 1920 04/10/17 2150 04/11/17 1129  WBC 9.4  --  8.4  NEUTROABS 7.9*  --   --   HGB 10.1* 12.2* 10.3*  HCT 32.5* 36.0* 33.6*  MCV 77.4*  --  77.8*  PLT 210  --  215    INR:  Recent Labs Lab 04/08/17 1312 04/10/17 1920 04/11/17 1129  INR 3.02 2.34 2.25    Other results:  EKG:    Imaging   Ct Head Wo Contrast  Result Date: 04/10/2017 CLINICAL DATA:  Confusion, anticoagulated. History of diabetes, defibrillator, substance abuse. EXAM: CT HEAD WITHOUT CONTRAST TECHNIQUE: Contiguous axial images  were obtained from the base of the skull through the vertex without intravenous contrast. COMPARISON:  CT HEAD January 29, 2017 FINDINGS: BRAIN: No intraparenchymal hemorrhage, mass effect nor midline shift. The ventricles and sulci are normal for age. Patchy supratentorial white matter hypodensities within normal range for patient's age, though non-specific are most compatible with chronic small vessel ischemic disease. No acute large vascular territory infarcts. No abnormal extra-axial fluid collections. Basal cisterns are patent. VASCULAR: Moderate to severe calcific atherosclerosis of the carotid siphons. SKULL: No skull fracture. No significant scalp soft tissue swelling. SINUSES/ORBITS: Mild paranasal sinus mucosal thickening, LEFT maxillary mucosal retention cyst. Stable bilateral exophthalmos. Symmetrically prominent cruise for the anterior ethmoidal artery's. OTHER: None. IMPRESSION: 1. No acute intracranial process. 2. Moderate to severe atherosclerosis; otherwise negative noncontrast  CT HEAD for age. Electronically Signed   By: Elon Alas M.D.   On: 04/10/2017 20:24   Dg Chest Portable 1 View  Result Date: 04/10/2017 CLINICAL DATA:  Shortness of breath. History of hypertension, defibrillator. EXAM: PORTABLE CHEST 1 VIEW COMPARISON:  Chest radiograph March 04, 2017 and CT chest March 07, 2017 FINDINGS: Stable moderate cardiomegaly, status post median sternotomy for CABG. LEFT ventricular assist device similarly positioned. Re- demonstration of LEFT cardiac defibrillator and RIGHT central venous catheter with distal tip projecting in mid superior vena cava. No pleural effusion or focal consolidation. Pulmonary vasculature appears normal. No pneumothorax. Soft tissue planes and included osseous structures are unchanged. IMPRESSION: Stable cardiomegaly without acute pulmonary process. Stable life-support lines. Electronically Signed   By: Elon Alas M.D.   On: 04/10/2017 19:15      Medications:     Scheduled Medications: . amiodarone  200 mg Oral Daily  . aspirin EC  81 mg Oral Daily  . Chlorhexidine Gluconate Cloth  6 each Topical Daily  . citalopram  10 mg Oral Daily  . docusate sodium  200 mg Oral QHS  . gabapentin  200 mg Oral BID  . insulin aspart  0-20 Units Subcutaneous TID WC  . insulin aspart  10 Units Subcutaneous TID WC  . insulin glargine  20 Units Subcutaneous QHS  . magnesium oxide  400 mg Oral Daily  . pantoprazole  40 mg Oral Daily  . senna-docusate  2 tablet Oral QODAY  . sildenafil  40 mg Oral TID  . sodium chloride flush  10-40 mL Intracatheter Q12H  . torsemide  80 mg Oral BID  . Warfarin - Pharmacist Dosing Inpatient   Does not apply q1800     Infusions: . DOBUTamine       PRN Medications:  acetaminophen, ondansetron (ZOFRAN) IV, ondansetron, sodium chloride flush   Patient Profile  Ronald Miller is a 61 year old with a history of HM3, chronic systolic heart failure, medtronic ICD, CAD, S/P CABG, HTN, cocaine abuse, tobacco abuse,  PAF, COPD admitted with AMS.   He has struggled in the community requiring multiple visit in the clinic for various reasons including volume overload and coping strategies.    Assessment/Plan:   1. AMS- Ammonia, ETOH, Drug screen ok. CT of head negative 04/10/17 2. Depression- continue celexa. Will need to see if we can get psych consult. He has been participating in group .  3. Chronic Systolic Heart Failure- Biventricular Heart Failure. Complicated by RV failure. HMIII. Continue 81 mg asa daily + coumadin. INR 1.94. LDH stable.  On dobutamine 6 mcg. Todays CO-OX 61%.   Continue sildenafil. No bb with RV failure CVP 3. Hold diuretics.  Hold  spiro with hyperkalemia 04/10/17.   4. PAF- continue amio 200 mg daily 6.  Hyponatremia- sodium 125 7. Anemia- last received feraheme 6//18/18. Hgb 10.9. Stable. .  8. DMII- continue home insulin. Add SSI 9. CAD- Severe 3v CAD S/P CABG with occluded grafts except LIMA. No CP.   10. Chronic Anticoagulation- on coumadin . INR goal 2-2.5. INR 1.94. Discussed with Pharmacy.  11. Hypokalemia- K 3.2 . Add 40 meq potassium twice a day. Check K at 1200. Repeat BMET in am.    Consult PT. ? SNF placement.  I reviewed the LVAD parameters from today, and compared the results to the patient's prior recorded data.  No programming changes were made.  The LVAD is functioning within specified parameters.  The patient performs LVAD self-test daily.  LVAD interrogation was negative for any significant power changes, alarms or PI events/speed drops.  LVAD equipment check completed and is in good working order.  Back-up equipment present.   LVAD education done on emergency procedures and precautions and reviewed exit site care.  Length of Stay: 1  Darrick Grinder, NP 04/12/2017, 8:33 AM  VAD Team --- VAD ISSUES ONLY--- Pager [phone] (7am - 7am)  Advanced Heart Failure Team  Pager [phone] (M-F; 7a - 4p)  Please contact [facility] for night-coverage after hours (4p -7a )  and weekends on [email or web address]  Patient seen and examined with Darrick Grinder, NP. We discussed all aspects of the encounter. I agree with the assessment and plan as stated above.   Continues to struggle. When I walked into his room this evening he was waking up from a nap and said he was dizzy and confused. Very anxious. Arms tingling. BP checked personally. MAP 80. Neuro exam with mild asterixis otherwise stable. Multiple PI events on VAD. CVP checked personally and was 4. Suspect he may have had a panic attack but also is quite volume depleted. Will give 250cc IVF. Hold diuretics. Restart lactulose. Recheck ammonia in am.  Glori Bickers, MD  7:39 PM

## 2017-04-12 NOTE — Progress Notes (Signed)
LVAD Coordinator Rounding Note:  Admitted 04/11/2017 due to altered mental status.   HeartMate 3 LVAD implanted on 09/09/2016 by Dr. Prescott Gum.   Vital signs: HR: 81 Doppler Pressure: not done Automatic BP: 84/72 O2 Sat: 95  Wt: 207 lbs     LVAD interrogation reveals:  Speed:5900 Flow: 5.8 Power: 4.7 PI: 2.4 Alarms: none Events:  3-5 PI events daily Fixed speed: 5900 Low speed limit: 5600  Drive Line: Weekly. Next due 7/13. Labs:  LDH trend: 251  INR trend: 2.25  Anticoagulation Plan: -INR Goal: 2-2.5 -ASA Dose: 81 mg  Adverse Events on VAD: -11/30/16> Milrinone gtt at discharge -01/27/17> dobutamine gtt at discharge  Gtts: Dobutamine 5 mcg  Plan/Recommendations:   1.Continue supportive care.  Tanda Rockers RN, VAD Coordinator 24/7 pager [phone]

## 2017-04-12 NOTE — Progress Notes (Signed)
[place] for coumadin Indication: LVAD  Allergies  Allergen Reactions  . Codeine Nausea And Vomiting and Other (See Comments)    In high doses  . Trazodone And Nefazodone Other (See Comments)    Dizziness with subsequent fall   . Lipitor [Atorvastatin] Nausea Only and Other (See Comments)    Nausea with high doses, tolerates 20mg  dose (08/22/16)  . Tape Other (See Comments)    Paper tape is ok    Patient Measurements: Height: 5\' 9"  (175.3 cm) Weight: 207 lb 10.8 oz (94.2 kg) IBW/kg (Calculated) : 70.7  Vital Signs: Temp: 97.6 F (36.4 C) (07/10 0825) Temp Source: Oral (07/10 0825) BP: 84/72 (07/10 0825) Pulse Rate: 79 (07/10 0825)  Labs:  Recent Labs  04/10/17 1920  04/10/17 2150 04/11/17 1129 04/12/17 0844  HGB 10.1*  --  12.2* 10.3* 10.9*  HCT 32.5*  --  36.0* 33.6* 36.2*  PLT 210  --   --  215 230  LABPROT 26.0*  --   --  25.2* 22.4*  INR 2.34  --   --  2.25 1.94  CREATININE  --   < > 1.80* 1.92* 1.75*  < > = values in this interval not displayed.  Estimated Creatinine Clearance: 50.9 mL/min (A) (by C-G formula based on SCr of 1.75 mg/dL (H)).   Medical History: Past Medical History:  Diagnosis Date  . AICD (automatic cardioverter/defibrillator) present   . ASCVD (arteriosclerotic cardiovascular disease)   . Chronic systolic CHF (congestive heart failure) ([place])   . COPD (chronic obstructive pulmonary disease) ([place])   . Coronary artery disease   . Depression   . Diabetes mellitus   . History of cocaine abuse   . Hypertension   . Presence of permanent cardiac pacemaker   . Shortness of breath dyspnea   . Suicidal ideation   . Tobacco abuse     Assessment: 61 yo male here with AMS. He is noted with an LVAD and pharmacy consulted to dose coumadin. -INR = 1.9 -LDH 233 -Hgb stable in 10s  Coumadin home dose: 15mg /day except 10mg  on Tues (last clinic visit 7/7).   Goal of Therapy:  INR= 2-2.5 Monitor  platelets by anticoagulation protocol: Yes   Plan:  -Repeat coumadin 15mg  po x1 -Daily PT/INR  Erin Hearing PharmD., BCPS Clinical Pharmacist Pager [phone] 04/12/2017 10:24 AM

## 2017-04-13 ENCOUNTER — Ambulatory Visit: Payer: Self-pay

## 2017-04-13 ENCOUNTER — Other Ambulatory Visit (HOSPITAL_COMMUNITY): Payer: Medicare HMO

## 2017-04-13 DIAGNOSIS — Z87891 Personal history of nicotine dependence: Secondary | ICD-10-CM

## 2017-04-13 DIAGNOSIS — F064 Anxiety disorder due to known physiological condition: Secondary | ICD-10-CM

## 2017-04-13 DIAGNOSIS — F1490 Cocaine use, unspecified, uncomplicated: Secondary | ICD-10-CM

## 2017-04-13 DIAGNOSIS — F419 Anxiety disorder, unspecified: Secondary | ICD-10-CM

## 2017-04-13 DIAGNOSIS — F4321 Adjustment disorder with depressed mood: Secondary | ICD-10-CM

## 2017-04-13 DIAGNOSIS — R4182 Altered mental status, unspecified: Secondary | ICD-10-CM

## 2017-04-13 LAB — GLUCOSE, CAPILLARY
GLUCOSE-CAPILLARY: 150 mg/dL — AB (ref 65–99)
GLUCOSE-CAPILLARY: 191 mg/dL — AB (ref 65–99)
Glucose-Capillary: 137 mg/dL — ABNORMAL HIGH (ref 65–99)
Glucose-Capillary: 198 mg/dL — ABNORMAL HIGH (ref 65–99)

## 2017-04-13 LAB — HEMOGLOBIN A1C
HEMOGLOBIN A1C: 7.4 % — AB (ref 4.8–5.6)
MEAN PLASMA GLUCOSE: 166 mg/dL

## 2017-04-13 LAB — COOXEMETRY PANEL
CARBOXYHEMOGLOBIN: 1.8 % — AB (ref 0.5–1.5)
CARBOXYHEMOGLOBIN: 1.7 % — AB (ref 0.5–1.5)
METHEMOGLOBIN: 1.3 % (ref 0.0–1.5)
Methemoglobin: 1.2 % (ref 0.0–1.5)
O2 SAT: 71 %
O2 SAT: 71.6 %
TOTAL HEMOGLOBIN: 10.6 g/dL — AB (ref 12.0–16.0)
Total hemoglobin: 9.7 g/dL — ABNORMAL LOW (ref 12.0–16.0)

## 2017-04-13 LAB — CBC
HCT: 33.8 % — ABNORMAL LOW (ref 39.0–52.0)
Hemoglobin: 10.2 g/dL — ABNORMAL LOW (ref 13.0–17.0)
MCH: 23.3 pg — ABNORMAL LOW (ref 26.0–34.0)
MCHC: 30.2 g/dL (ref 30.0–36.0)
MCV: 77.3 fL — ABNORMAL LOW (ref 78.0–100.0)
PLATELETS: 190 10*3/uL (ref 150–400)
RBC: 4.37 MIL/uL (ref 4.22–5.81)
RDW: 19.3 % — AB (ref 11.5–15.5)
WBC: 7.4 10*3/uL (ref 4.0–10.5)

## 2017-04-13 LAB — BASIC METABOLIC PANEL
ANION GAP: 9 (ref 5–15)
BUN: 32 mg/dL — ABNORMAL HIGH (ref 6–20)
CALCIUM: 8.8 mg/dL — AB (ref 8.9–10.3)
CO2: 30 mmol/L (ref 22–32)
Chloride: 89 mmol/L — ABNORMAL LOW (ref 101–111)
Creatinine, Ser: 1.75 mg/dL — ABNORMAL HIGH (ref 0.61–1.24)
GFR, EST AFRICAN AMERICAN: 47 mL/min — AB (ref >60)
GFR, EST NON AFRICAN AMERICAN: 41 mL/min — AB (ref >60)
GLUCOSE: 163 mg/dL — AB (ref 65–99)
Potassium: 3.4 mmol/L — ABNORMAL LOW (ref 3.5–5.1)
SODIUM: 128 mmol/L — AB (ref 135–145)

## 2017-04-13 LAB — LACTATE DEHYDROGENASE: LDH: 185 U/L (ref 98–192)

## 2017-04-13 LAB — PROTIME-INR
INR: 2.42
Prothrombin Time: 26.7 s — ABNORMAL HIGH (ref 11.4–15.2)

## 2017-04-13 MED ORDER — CLONAZEPAM 0.25 MG PO TBDP: 0.2500 mg | ORAL_TABLET | Freq: Two times a day (BID) | ORAL | Status: DC

## 2017-04-13 MED ORDER — CLONAZEPAM 0.125 MG PO TBDP
0.2500 mg | ORAL_TABLET | Freq: Two times a day (BID) | ORAL | Status: DC
  Administered 2017-04-13 – 2017-04-16 (×6): 0.25 mg via ORAL
  Filled 2017-04-13 (×6): qty 2

## 2017-04-13 MED ORDER — WARFARIN SODIUM 4 MG PO TABS
10.0000 mg | ORAL_TABLET | Freq: Once | ORAL | Status: AC
  Administered 2017-04-13: 18:00:00 10 mg via ORAL
  Filled 2017-04-13: qty 2

## 2017-04-13 MED ORDER — CITALOPRAM HYDROBROMIDE 20 MG PO TABS
20.0000 mg | ORAL_TABLET | Freq: Every day | ORAL | Status: DC
  Administered 2017-04-14 – 2017-04-16 (×3): 20 mg via ORAL
  Filled 2017-04-13 (×3): qty 1

## 2017-04-13 MED ORDER — MIDAZOLAM HCL 2 MG/2ML IJ SOLN
1.0000 mg | Freq: Once | INTRAMUSCULAR | Status: AC
  Administered 2017-04-13: 1 mg via INTRAVENOUS
  Filled 2017-04-13: qty 2
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
## 2017-04-13 NOTE — Evaluation (Signed)
Physical Therapy Evaluation Patient Details Name: Ronald Miller. MRN: [id number] DOB: 28-Nov-1955 Today's Date: 04/13/2017   History of Present Illness  Ronald Miller is a 61 year old with a history of LVAD, chronic systolic heart failure, medtronic ICD, CAD, S/P CABG, HTN, cocaine abuse, tobacco abuse, PAF, COPD admitted with AMS.   Clinical Impression  Pt admitted with above diagnosis. Pt currently with functional limitations due to the deficits listed below (see PT Problem List). Pt was able to ambulate with rollator with min guard assist with good safety overall.  Pt having difficulty with sequence of equipment and switching to battery from power source.  Periods of confusion limit this pt.  Feel that SNF would benefit pt given confusion.  Will follow.  Pt will benefit from skilled PT to increase their independence and safety with mobility to allow discharge to the venue listed below.    Follow Up Recommendations SNF;Supervision/Assistance - 24 hour    Equipment Recommendations  None recommended by PT    Recommendations for Other Services       Precautions / Restrictions Precautions Precautions: Fall Restrictions Weight Bearing Restrictions: No      Mobility  Bed Mobility Overal bed mobility: Independent                Transfers Overall transfer level: Needs assistance Equipment used: 4-wheeled walker Transfers: Sit to/from Stand Sit to Stand: Min guard         General transfer comment: Pt was able to stand to RW with cues only.   Ambulation/Gait Ambulation/Gait assistance: Min guard Ambulation Distance (Feet): 250 Feet Assistive device: 4-wheeled walker Gait Pattern/deviations: Step-through pattern;Decreased stride length   Gait velocity interpretation: Below normal speed for age/gender General Gait Details: Pt was able to ambulate with rollator without assist.  At times, c/o right hand numbness that bothers him.  Pt confused intermittently. Pt could not  perform switching from power source to batteries and back without assist from PT.  Pt would remember bits and pieces but could not perform with out cues.   Stairs            Wheelchair Mobility    Modified Rankin (Stroke Patients Only)       Balance Overall balance assessment: Needs assistance Sitting-balance support: No upper extremity supported;Feet supported Sitting balance-Leahy Scale: Good     Standing balance support: Bilateral upper extremity supported;During functional activity Standing balance-Leahy Scale: Fair Standing balance comment: Pt can stand statically without UE support                             Pertinent Vitals/Pain Pain Assessment: No/denies pain  Flow 5.5, 5900 speed, 3.1 PI, 4.7 power.  No change in readings with activity.  87-93% on RA O2 sasts with activity.  Other VSS.   Home Living Family/patient expects to be discharged to:: Private residence Living Arrangements: Alone Available Help at Discharge: Family;Available 24 hours/day (sister and mother ) Type of Home: House Home Access: Stairs to enter Entrance Stairs-Rails: Right Entrance Stairs-Number of Steps: 2 Home Layout: One level Home Equipment: Walker - 4 wheels      Prior Function Level of Independence: Independent with assistive device(s)         Comments: Pt had been functioning Independently with LVAD until this episode of AMS.  Was driving.     Hand Dominance        Extremity/Trunk Assessment   Upper Extremity Assessment Upper  Extremity Assessment: Defer to OT evaluation    Lower Extremity Assessment Lower Extremity Assessment: Generalized weakness    Cervical / Trunk Assessment Cervical / Trunk Assessment: Normal  Communication   Communication: No difficulties  Cognition Arousal/Alertness: Awake/alert Behavior During Therapy: WFL for tasks assessed/performed Overall Cognitive Status: Impaired/Different from baseline Area of Impairment:  Memory;Following commands;Safety/judgement;Problem solving;Awareness                     Memory: Decreased short-term memory Following Commands: Follows one step commands consistently Safety/Judgement: Decreased awareness of deficits;Decreased awareness of safety Awareness: Intellectual Problem Solving: Requires verbal cues        General Comments General comments (skin integrity, edema, etc.): Pts mom called during treatment and pt tearful.  She was asking questions and pt flustered because he felt he couldn't answer them all.     Exercises General Exercises - Lower Extremity Long Arc Quad: AROM;Both;10 reps;Seated   Assessment/Plan    PT Assessment Patient needs continued PT services  PT Problem List Decreased activity tolerance;Decreased balance;Decreased mobility;Decreased knowledge of use of DME;Decreased safety awareness;Decreased knowledge of precautions       PT Treatment Interventions DME instruction;Gait training;Functional mobility training;Therapeutic activities;Therapeutic exercise;Balance training;Patient/family education    PT Goals (Current goals can be found in the Care Plan section)  Acute Rehab PT Goals Patient Stated Goal: to go home PT Goal Formulation: With patient Time For Goal Achievement: 04/27/17 Potential to Achieve Goals: Good    Frequency Min 3X/week   Barriers to discharge Decreased caregiver support      Co-evaluation               AM-PAC PT "6 Clicks" Daily Activity  Outcome Measure Difficulty turning over in bed (including adjusting bedclothes, sheets and blankets)?: None Difficulty moving from lying on back to sitting on the side of the bed? : None Difficulty sitting down on and standing up from a chair with arms (e.g., wheelchair, bedside commode, etc,.)?: A Little Help needed moving to and from a bed to chair (including a wheelchair)?: A Little Help needed walking in hospital room?: A Little Help needed climbing 3-5  steps with a railing? : A Lot 6 Click Score: 19    End of Session Equipment Utilized During Treatment: Gait belt Activity Tolerance: Patient limited by fatigue Patient left: in chair;with call bell/phone within reach Nurse Communication: Mobility status PT Visit Diagnosis: Muscle weakness (generalized) (M62.81)    Time: 4174-0814 PT Time Calculation (min) (ACUTE ONLY): 33 min   Charges:   PT Evaluation $PT Eval Moderate Complexity: 1 Procedure PT Treatments $Gait Training: 8-22 mins   PT G Codes:        Kiree Dejarnette,PT Acute Rehabilitation [phone] (pager)   Denice Paradise 04/13/2017, 12:49 PM

## 2017-04-13 NOTE — Progress Notes (Signed)
ANTICOAGULATION CONSULT NOTE - Follow Up Consult  Pharmacy Consult for Coumadin Indication: LVAD  Allergies  Allergen Reactions  . Codeine Nausea And Vomiting and Other (See Comments)    In high doses  . Trazodone And Nefazodone Other (See Comments)    Dizziness with subsequent fall   . Lipitor [Atorvastatin] Nausea Only and Other (See Comments)    Nausea with high doses, tolerates 20mg  dose (08/22/16)  . Tape Other (See Comments)    Paper tape is ok    Patient Measurements: Height: 5\' 9"  (175.3 cm) Weight: 209 lb 7 oz (95 kg) IBW/kg (Calculated) : 70.7  Vital Signs: Temp: 98.2 F (36.8 C) (07/11 1312) Temp Source: Oral (07/11 1312) BP: 78/55 (07/11 1312) Pulse Rate: 79 (07/11 1312)  Labs:  Recent Labs  04/11/17 1129 04/12/17 0844 04/13/17 0500  HGB 10.3* 10.9* 10.2*  HCT 33.6* 36.2* 33.8*  PLT 215 230 190  LABPROT 25.2* 22.4* 26.7*  INR 2.25 1.94 2.42  CREATININE 1.92* 1.75* 1.75*    Estimated Creatinine Clearance: 51 mL/min (A) (by C-G formula based on SCr of 1.75 mg/dL (H)).  Assessment: 61yom continues on coumadin for his LVAD. INR therapeutic at 2.42. CBC and LDH stable. No bleeding. Continues on amiodarone as pta.  Home dose: 15mg  daily except 10mg  on Tuesdays (last clinic visit 7/7)  Goal of Therapy:  2-2.5 Monitor platelets by anticoagulation protocol: Yes   Plan:  1) Coumadin 10mg  tonight 2) Daily INR, CBC, LDH  Deboraha Sprang 04/13/2017,1:42 PM

## 2017-04-13 NOTE — Progress Notes (Signed)
Patient very nervous, worried, discussed relaxation techniques. Patient with right arm tingling thumb numb. Has been having this issue. CVP done 18, patient denies SOB, however is grunting at times, o2 sats> 90  Doppler mean 62. Paged VAD team with findings.

## 2017-04-13 NOTE — Progress Notes (Signed)
Advanced Heart Failure VAD Team Note  Subjective:    Admitted with AMS. UDS and ammonia ok. No ETOH. Continues on Dobutamine 6 mcg.     CO-OX 71% this am. Weight up 2 lbs. Creatinine stable. K remains low.   Very anxious this morning. States we are trying to "put him away" and he doesn't want SNF or any facility. Upset and says "They think it's all in my head". Denies SOB. Still having paraesthesias in bilateral arms.    LVAD INTERROGATION:  HeartMate II LVAD:  Flow 5.6 liters/min, speed 5900, power 5, PI 2.6  > 30 PI events, but frequency much improved since IVF. (Majority were yesterday afternoon)  Objective:    Vital Signs:   Temp:  [97.4 F (36.3 C)-98.4 F (36.9 C)] 98 F (36.7 C) (07/11 0751) Pulse Rate:  [25-95] 81 (07/11 0751) Resp:  [13-25] 14 (07/11 0751) BP: (92-116)/(50-83) 104/75 (07/11 0751) SpO2:  [91 %-99 %] 94 % (07/11 0751) Weight:  [209 lb 7 oz (95 kg)] 209 lb 7 oz (95 kg) (07/11 0443) Last BM Date: 04/11/17 Mean arterial Pressure 85  Intake/Output:   Intake/Output Summary (Last 24 hours) at 04/13/17 0911 Last data filed at 04/13/17 0800  Gross per 24 hour  Intake            553.8 ml  Output             1500 ml  Net           -946.2 ml     Physical Exam   CVP 6-7 GENERAL: Fatigued appearing. Anxious.  NAD.  HEENT: Normal. NECK: Supple, JVP 6-7 cm. Carotids OK.  CARDIAC:  Mechanical heart sounds with LVAD hum present.  LUNGS:  CTAB, normal effort.  ABDOMEN:  NT, ND, no HSM. No bruits or masses. +BS  LVAD exit site: Well-healed and incorporated. Dressing dry and intact. No erythema or drainage. Stabilization device present and accurately applied. Driveline dressing changed daily per sterile technique. EXTREMITIES:  Warm and dry. No cyanosis, clubbing, rash, or edema.  NEUROLOGIC:  Alert & oriented x 3. Cranial nerves grossly intact. Moves all 4 extremities w/o difficulty. Affect anxious  Telemetry   Personally reviewed, V paced.  EKG     N/A  Labs   Basic Metabolic Panel:  Recent Labs Lab 04/10/17 2140 04/10/17 2150 04/11/17 1129 04/12/17 0156 04/12/17 0844 04/12/17 1343 04/13/17 0500  NA 124* 125* 125*  --  131*  --  128*  K 6.3* 6.3* 4.5 2.9* 3.2* 3.8 3.4*  CL 88* 88* 87*  --  86*  --  89*  CO2 27  --  27  --  32  --  30  GLUCOSE 211* 213* 235*  --  105*  --  163*  BUN 33* 38* 38*  --  33*  --  32*  CREATININE 1.82* 1.80* 1.92*  --  1.75*  --  1.75*  CALCIUM 9.2  --  9.3  --  9.6  --  8.8*  MG  --   --   --  2.1 1.9  --   --     Liver Function Tests:  Recent Labs Lab 04/10/17 2140 04/11/17 1129  AST 37 38  ALT 36 38  ALKPHOS 82 81  BILITOT 0.8 1.2  PROT 8.0 8.3*  ALBUMIN 4.3 4.4   No results for input(s): LIPASE, AMYLASE in the last 168 hours.  Recent Labs Lab 04/11/17 1129 04/12/17 1917  AMMONIA 32 29  CBC:  Recent Labs Lab 04/10/17 1920 04/10/17 2150 04/11/17 1129 04/12/17 0844 04/13/17 0500  WBC 9.4  --  8.4 9.0 7.4  NEUTROABS 7.9*  --   --   --   --   HGB 10.1* 12.2* 10.3* 10.9* 10.2*  HCT 32.5* 36.0* 33.6* 36.2* 33.8*  MCV 77.4*  --  77.8* 78.4 77.3*  PLT 210  --  215 230 190    INR:  Recent Labs Lab 04/08/17 1312 04/10/17 1920 04/11/17 1129 04/12/17 0844 04/13/17 0500  INR 3.02 2.34 2.25 1.94 2.42    Other results:  EKG:    Imaging   No results found.   Medications:     Scheduled Medications: . amiodarone  200 mg Oral Daily  . aspirin EC  81 mg Oral Daily  . Chlorhexidine Gluconate Cloth  6 each Topical Daily  . citalopram  10 mg Oral Daily  . docusate sodium  200 mg Oral QHS  . gabapentin  200 mg Oral BID  . insulin aspart  0-20 Units Subcutaneous TID WC  . insulin aspart  10 Units Subcutaneous TID WC  . insulin glargine  20 Units Subcutaneous QHS  . magnesium oxide  400 mg Oral Daily  . pantoprazole  40 mg Oral Daily  . senna-docusate  2 tablet Oral QODAY  . sildenafil  40 mg Oral TID  . sodium chloride flush  10-40 mL Intracatheter  Q12H  . Warfarin - Pharmacist Dosing Inpatient   Does not apply q1800    Infusions: . DOBUTamine 6 mcg/kg/min (04/13/17 0800)    PRN Medications: acetaminophen, ondansetron (ZOFRAN) IV, ondansetron, sodium chloride flush   Patient Profile  Mr Hengst is a 61 year old with a history of HM3, chronic systolic heart failure, medtronic ICD, CAD, S/P CABG, HTN, cocaine abuse, tobacco abuse, PAF, COPD admitted with AMS.   He has struggled in the community requiring multiple visit in the clinic for various reasons including volume overload and coping strategies.   Assessment/Plan:   1. AMS- Ammonia, ETOH, Drug screen ok. CT of head negative 04/10/17 - Ammonia again normal this morning at 29. 2. Depression- - Continue Celexa.  - Psych consult. Has been doing group therapy, but has missed multiple sessions.  - ? If needs antipsychotic. 3. Chronic Systolic Heart Failure- Biventricular Heart Failure. Complicated by RV failure. HMIII. Continue 81 mg asa daily + coumadin. INR 1.94. LDH stable.  - Coox 71% this am on dobutamine 6 mgg/kg/min. - CVP 6-7. Given IVF yesterday. [name] on hold. No diuretics or fluid right now.  PI events decreased in frequency.  - Continue sildenafil. No bb with RV failure 4. PAF-  - Continue amio 200 mg daily 6.  Hyponatremia- - Na 128 this am. Continue to follow.  7. Anemia- last received feraheme 6//18/18.  - Stable Hgb at 10.2 8. DMII- continue home insulin.  - Continue SSI.  9. CAD- Severe 3v CAD S/P CABG with occluded grafts except LIMA.  - No CP. No s/s of ischemia.  10. Chronic Anticoagulation- on coumadin .  - INR goal 2.0-2.5. INR 2.4 this am. Discussed with pharm.  11. Hypokalemia - K 3.4 this am. Continue supp.    Consult PT. Pt adamantly refuses SNF consideration. With on-going issues and Depression/Anxiety, will have Psych see for recommendations.   I reviewed the LVAD parameters from today, and compared the results to the patient's prior recorded  data.  No programming changes were made.  The LVAD is functioning within specified  parameters.  The patient performs LVAD self-test daily.  LVAD interrogation was negative for any significant power changes, alarms or PI events/speed drops.  LVAD equipment check completed and is in good working order.  Back-up equipment present.   LVAD education done on emergency procedures and precautions and reviewed exit site care.   Length of Stay: 2  Annamaria Helling 04/13/2017, 9:11 AM  VAD Team --- VAD ISSUES ONLY--- Pager [phone] (7am - 7am)  Advanced Heart Failure Team  Pager [phone] (M-F; 7a - 4p)  Please contact [facility] for night-coverage after hours (4p -7a ) and weekends on [email or web address]  Patient seen and examined with the above-signed Advanced Practice Provider and/or Housestaff. I personally reviewed laboratory data, imaging studies and relevant notes. I independently examined the patient and formulated the important aspects of the plan. I have edited the note to reflect any of my changes or salient points. I have personally discussed the plan with the patient and/or family.  Volume status remains low. Diuretics on hold. VAD parameters and MAPs are ok. Despite extensive efforts to reassure him, he continues to be extremely anxious and appears confused and agitated at times. Ammonia is down. Psychiatry has seen and feels he is well oriented and main issues are anxiety and depression. I am wondering if he may have a component of withdrawal but drug screens have been normal recently. Disposition will remain a challenge.    Glori Bickers, MD  8:29 PM

## 2017-04-13 NOTE — Progress Notes (Signed)
LVAD Coordinator Rounding Note:  Admitted 7/9/2018due to altered mental status.   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 Prescott Gum.   Vital signs: HR: 81 Doppler Pressure: 85 Automatic BP: 104/75 (85) O2 Sat: 94 RA Wt: 207 lbs>209   LVAD interrogation reveals:  Speed:5900 Flow: 5.6 Power: 5 PI: 2.9 Alarms: none Events: 3-5 PI events daily Fixed speed: 5900 Low speed limit: 5600 HCT: 34- updated personally today  Drive Line: Weekly. Next due 7/13. Labs:  LDH trend: 251>185  INR trend: 2.25>2.42  Anticoagulation Plan: -INR Goal: 2-2.5 -ASA Dose: 81 mg  Adverse Events on VAD: -11/30/16> Milrinone gtt at discharge -01/27/17>dobutamine gtt at discharge  Gtts: Dobutamine 5 mcg  Plan/Recommendations:   1.Continue supportive care.  Balinda Quails RN, VAD Coordinator 24/7 pager [phone]

## 2017-04-14 ENCOUNTER — Encounter (HOSPITAL_COMMUNITY): Payer: Self-pay

## 2017-04-14 ENCOUNTER — Other Ambulatory Visit (HOSPITAL_COMMUNITY): Payer: Medicare HMO

## 2017-04-14 DIAGNOSIS — I5023 Acute on chronic systolic (congestive) heart failure: Secondary | ICD-10-CM

## 2017-04-14 LAB — CBC
HEMATOCRIT: 31.8 % — AB (ref 39.0–52.0)
Hemoglobin: 9.6 g/dL — ABNORMAL LOW (ref 13.0–17.0)
MCH: 23.5 pg — AB (ref 26.0–34.0)
MCHC: 30.2 g/dL (ref 30.0–36.0)
MCV: 77.8 fL — AB (ref 78.0–100.0)
PLATELETS: 201 10*3/uL (ref 150–400)
RBC: 4.09 MIL/uL — AB (ref 4.22–5.81)
RDW: 19.3 % — AB (ref 11.5–15.5)
WBC: 7.2 10*3/uL (ref 4.0–10.5)

## 2017-04-14 LAB — DRUG SCREEN 10 W/CONF, SERUM
AMPHETAMINES, IA: NEGATIVE ng/mL
BARBITURATES, IA: NEGATIVE ug/mL
Benzodiazepines, IA: NEGATIVE ng/mL
COCAINE & METABOLITE, IA: NEGATIVE ng/mL
METHADONE, IA: NEGATIVE ng/mL
OXYCODONES, IA: NEGATIVE ng/mL
Opiates, IA: NEGATIVE ng/mL
PHENCYCLIDINE, IA: NEGATIVE ng/mL
PROPOXYPHENE, IA: NEGATIVE ng/mL
THC(Marijuana) Metabolite, IA: NEGATIVE ng/mL

## 2017-04-14 LAB — BASIC METABOLIC PANEL
ANION GAP: 8 (ref 5–15)
BUN: 26 mg/dL — AB (ref 6–20)
CO2: 27 mmol/L (ref 22–32)
Calcium: 8.1 mg/dL — ABNORMAL LOW (ref 8.9–10.3)
Chloride: 92 mmol/L — ABNORMAL LOW (ref 101–111)
Creatinine, Ser: 1.23 mg/dL (ref 0.61–1.24)
GFR calc Af Amer: >60 mL/min (ref >60)
GLUCOSE: 174 mg/dL — AB (ref 65–99)
POTASSIUM: 4 mmol/L (ref 3.5–5.1)
Sodium: 127 mmol/L — ABNORMAL LOW (ref 135–145)

## 2017-04-14 LAB — COOXEMETRY PANEL
CARBOXYHEMOGLOBIN: 1.7 % — AB (ref 0.5–1.5)
METHEMOGLOBIN: 1.1 % (ref 0.0–1.5)
O2 Saturation: 78.6 %
Total hemoglobin: 10 g/dL — ABNORMAL LOW (ref 12.0–16.0)

## 2017-04-14 LAB — GLUCOSE, CAPILLARY
GLUCOSE-CAPILLARY: 177 mg/dL — AB (ref 65–99)
GLUCOSE-CAPILLARY: 121 mg/dL — AB (ref 65–99)
GLUCOSE-CAPILLARY: 174 mg/dL — AB (ref 65–99)
Glucose-Capillary: 183 mg/dL — ABNORMAL HIGH (ref 65–99)

## 2017-04-14 LAB — LACTATE DEHYDROGENASE: LDH: 385 U/L — AB (ref 98–192)

## 2017-04-14 LAB — PROTIME-INR
INR: 2.79
Prothrombin Time: 30 seconds — ABNORMAL HIGH (ref 11.4–15.2)

## 2017-04-14 MED ORDER — FUROSEMIDE 10 MG/ML IJ SOLN
80.0000 mg | Freq: Once | INTRAMUSCULAR | Status: AC
  Administered 2017-04-14: 80 mg via INTRAVENOUS
  Filled 2017-04-14: qty 8

## 2017-04-14 MED ORDER — WARFARIN SODIUM 4 MG PO TABS: 5.0000 mg | ORAL_TABLET | Freq: Once | ORAL | Status: DC

## 2017-04-14 MED ORDER — WARFARIN SODIUM 5 MG PO TABS
5.0000 mg | ORAL_TABLET | Freq: Once | ORAL | Status: AC
  Administered 2017-04-14: 5 mg via ORAL
  Filled 2017-04-14 (×2): qty 1

## 2017-04-14 MED ORDER — TORSEMIDE 20 MG PO TABS
80.0000 mg | ORAL_TABLET | Freq: Two times a day (BID) | ORAL | Status: DC
  Administered 2017-04-14: 80 mg via ORAL
  Filled 2017-04-14 (×2): qty 4

## 2017-04-14 MED ORDER — POTASSIUM CHLORIDE CRYS ER 20 MEQ PO TBCR
40.0000 meq | EXTENDED_RELEASE_TABLET | Freq: Two times a day (BID) | ORAL | Status: DC
  Administered 2017-04-14 – 2017-04-16 (×5): 40 meq via ORAL
  Filled 2017-04-14 (×5): qty 2

## 2017-04-14 NOTE — Progress Notes (Signed)
Resting after klonopin and versed. Repositioning on own. This AM CVP still elevated. Has Av waveform. Labs obtained, patient continues to have eyes closed.

## 2017-04-14 NOTE — Plan of Care (Signed)
Problem: Cardiac: Goal: LVAD will function as expected and patient will experience no clinical alarms Patient concerned and anxious at times about general life and expectancy. CVP increasing last PM MD aware. Continuing to monitor.

## 2017-04-14 NOTE — Progress Notes (Signed)
Paged by bedside RN stating that the CVP is still 18-this was double checked with another RN. Coox is 71.6. RN instructed to stop pts PO fluids until morning when MD can assess fluid status. O2 states are 96% and lung sounds are clear. Nurse states that anxiety is much better after Versed.

## 2017-04-14 NOTE — Progress Notes (Signed)
LVAD Coordinator Rounding Note:  Admitted 7/9/2018due to altered mental status.   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 Prescott Gum.   Vital signs: HR: 77 vpaced Doppler Pressure: 86 Automatic BP: 104/75 (85) O2 Sat: 95 RA Wt: 207 lbs>209>194lbs  LVAD interrogation reveals:  Speed:5900 Flow: 5.6 Power: 4.8 PI: 2.5 Alarms: none Events: 3-5 PI events daily Fixed speed: 5900 Low speed limit: 5600 HCT: 32- updated personally today  Drive Line: Weekly. Next due 7/13. Labs:  LDH trend: 251>185>385  INR trend: 2.25>2.42>2.79  Anticoagulation Plan: -INR Goal: 2-2.5 -ASA Dose: 81 mg  Adverse Events on VAD: -11/30/16> Milrinone gtt at discharge -01/27/17>dobutamine gtt at discharge  Gtts: Dobutamine 6 mcg   Paged multiple times last night as pt was anxious. Pt reassured this morning that he is in good hands and we are taking care of him. Our social worker is working on a place for him to go after discharged so that the pt is not alone at home. Pt is fearful of being alone but is refusing SNF.    Plan/Recommendations:   1.Continue supportive care.  Tanda Rockers RN, VAD Coordinator 24/7 pager [phone]
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
## 2017-04-14 NOTE — Progress Notes (Signed)
Paged by bedside RN stating that the pt is very anxious, MAP is 62 and CVP is 18. RN instructed to double check CVP as pt had a low volume status today and recheck the BP with an automatic cuff as this was working today as well. Order placed for 1 mg of Versed per Dr. Haroldine Laws.

## 2017-04-14 NOTE — Progress Notes (Signed)
Advanced Heart Failure VAD Team Note  Subjective:    Admitted with AMS. UDS and ammonia ok. No ETOH. Continues on Dobutamine 6 mcg.     CO-OX 78.6% this am. Weight shows down 15 lbs. Inaccurate. Creatinine and K stable.   Denies SOB. Very anxious. Scared. Cannot voice specific fears. Still with R hand paraesthesias. No CP.  CVP up to 13-14  LVAD INTERROGATION:  HeartMate II LVAD:  Flow 5.6 liters/min, speed 5900, power 5.0, PI 2.3. No PI events x 24 hours.   Objective:    Vital Signs:   Temp:  [97.7 F (36.5 C)-98.4 F (36.9 C)] 97.7 F (36.5 C) (07/12 0741) Pulse Rate:  [61-83] 83 (07/12 0741) Resp:  [15-20] 19 (07/12 0741) BP: (84-103)/(61-87) 84/61 (07/12 0741) SpO2:  [90 %-97 %] 97 % (07/12 0741) Weight:  [194 lb 3.6 oz (88.1 kg)] 194 lb 3.6 oz (88.1 kg) (07/12 0600) Last BM Date: 04/11/17 Mean arterial Pressure 70-80  Intake/Output:   Intake/Output Summary (Last 24 hours) at 04/14/17 0811 Last data filed at 04/14/17 3149  Gross per 24 hour  Intake           1225.5 ml  Output             2000 ml  Net           -774.5 ml     Physical Exam   CVP 13-14 GENERAL: Anxious. Fatigued. NAD at rest.  HEENT: Normal. NECK: Supple, JVP to angle of jaw. Carotids OK.  CARDIAC:  Mechanical heart sounds with LVAD hum present.  LUNGS:  CTAB, normal effort.  ABDOMEN:  NT, ND, no HSM. No bruits or masses. +BS  LVAD exit site: Well-healed and incorporated. Dressing dry and intact. No erythema or drainage. Stabilization device present and accurately applied. Driveline dressing changed daily per sterile technique. EXTREMITIES:  Warm and dry. No cyanosis, clubbing, rash, or edema.  NEUROLOGIC:  Alert & oriented x 3. Cranial nerves grossly intact. Moves all 4 extremities w/o difficulty. Affect pleasant      Telemetry   Personally reviewed, V paced 80s.    EKG    N/A  Labs   Basic Metabolic Panel:  Recent Labs Lab 04/10/17 2140 04/10/17 2150 04/11/17 1129  04/12/17 0156 04/12/17 0844 04/12/17 1343 04/13/17 0500 04/14/17 0500  NA 124* 125* 125*  --  131*  --  128* 127*  K 6.3* 6.3* 4.5 2.9* 3.2* 3.8 3.4* 4.0  CL 88* 88* 87*  --  86*  --  89* 92*  CO2 27  --  27  --  32  --  30 27  GLUCOSE 211* 213* 235*  --  105*  --  163* 174*  BUN 33* 38* 38*  --  33*  --  32* 26*  CREATININE 1.82* 1.80* 1.92*  --  1.75*  --  1.75* 1.23  CALCIUM 9.2  --  9.3  --  9.6  --  8.8* 8.1*  MG  --   --   --  2.1 1.9  --   --   --     Liver Function Tests:  Recent Labs Lab 04/10/17 2140 04/11/17 1129  AST 37 38  ALT 36 38  ALKPHOS 82 81  BILITOT 0.8 1.2  PROT 8.0 8.3*  ALBUMIN 4.3 4.4   No results for input(s): LIPASE, AMYLASE in the last 168 hours.  Recent Labs Lab 04/11/17 1129 04/12/17 1917  AMMONIA 32 29    CBC:  Recent Labs  Lab 04/10/17 1920 04/10/17 2150 04/11/17 1129 04/12/17 0844 04/13/17 0500 04/14/17 0500  WBC 9.4  --  8.4 9.0 7.4 7.2  NEUTROABS 7.9*  --   --   --   --   --   HGB 10.1* 12.2* 10.3* 10.9* 10.2* 9.6*  HCT 32.5* 36.0* 33.6* 36.2* 33.8* 31.8*  MCV 77.4*  --  77.8* 78.4 77.3* 77.8*  PLT 210  --  215 230 190 201    INR:  Recent Labs Lab 04/10/17 1920 04/11/17 1129 04/12/17 0844 04/13/17 0500 04/14/17 0500  INR 2.34 2.25 1.94 2.42 2.79    Other results:  EKG:    Imaging   No results found.   Medications:     Scheduled Medications: . amiodarone  200 mg Oral Daily  . aspirin EC  81 mg Oral Daily  . Chlorhexidine Gluconate Cloth  6 each Topical Daily  . citalopram  20 mg Oral Daily  . clonazepam  0.25 mg Oral BID  . docusate sodium  200 mg Oral QHS  . gabapentin  200 mg Oral BID  . insulin aspart  0-20 Units Subcutaneous TID WC  . insulin aspart  10 Units Subcutaneous TID WC  . insulin glargine  20 Units Subcutaneous QHS  . magnesium oxide  400 mg Oral Daily  . pantoprazole  40 mg Oral Daily  . senna-docusate  2 tablet Oral QODAY  . sildenafil  40 mg Oral TID  . sodium chloride  flush  10-40 mL Intracatheter Q12H  . Warfarin - Pharmacist Dosing Inpatient   Does not apply q1800    Infusions: . DOBUTamine 6 mcg/kg/min (04/14/17 8416)    PRN Medications: acetaminophen, ondansetron (ZOFRAN) IV, ondansetron, sodium chloride flush   Patient Profile  Ronald Miller is a 61 year old with a history of HM3, chronic systolic heart failure, medtronic ICD, CAD, S/P CABG, HTN, cocaine abuse, tobacco abuse, PAF, COPD admitted with AMS.   He has struggled in the community requiring multiple visit in the clinic for various reasons including volume overload and coping strategies.   Assessment/Plan:   1. AMS- Ammonia, ETOH, Drug screen ok. CT of head negative 04/10/17 - Ammonia normal. Remains highly anxious. Tox screen negative.  2. Depression- - Continue Celexa at 20 mg daily (increased per Psych) - Continue prn klonopin. 3. Chronic Systolic Heart Failure- Biventricular Heart Failure. Complicated by RV failure. HMIII. Continue 81 mg asa daily + coumadin. INR 1.94. LDH stable.  - Coox 78.6% this am on dobutamine 6 mg/kg/min. No signs of infection. - CVP 13-14. Will give 80 mg IV lasix this am and resume home torsemide tonight.  - Resume K supp at 40 meq BID. Follow BMET for additional prn.  - Continue sildenafil.  - No BB with RV failure 4. PAF-  - Continue amio 200 mg daily. No change.  6.  Hyponatremia- - Na 127 this am. Free water restrict.  7. Anemia- last received feraheme 6//18/18.  - Hgb 10.2 -> 9.6. No overt bleeding. Continue to follow.  8. DMII- continue home insulin.  - Continue SSI. CBGs upper 100s. 9. CAD- Severe 3v CAD S/P CABG with occluded grafts except LIMA.  - No CP. No s/s of ischemia.  10. Chronic Anticoagulation- on coumadin .  - INR goal 2.0-2.5. INR 2.79 this am. Discussed dosing with Pharm-D.  Discussed with pharm.  11. Hypokalemia - K 4.0 this am. Continue to follow.    Disposition pending. PT recommends SNF with intermittent confusion. Pt  unwilling  to consider at this time. Remains anxious and feels like he is dying.  Pt re-assured that there are no immediate threats to life.   I reviewed the LVAD parameters from today, and compared the results to the patient's prior recorded data.  No programming changes were made.  The LVAD is functioning within specified parameters.  The patient performs LVAD self-test daily.  LVAD interrogation was negative for any significant power changes, alarms or PI events/speed drops.  LVAD equipment check completed and is in good working order.  Back-up equipment present.   LVAD education done on emergency procedures and precautions and reviewed exit site care.   Length of Stay: 3  Annamaria Helling 04/14/2017, 8:11 AM  VAD Team --- VAD ISSUES ONLY--- Pager [phone] (7am - 7am)  Advanced Heart Failure Team  Pager [phone] (M-F; 7a - 4p)  Please contact [facility] for night-coverage after hours (4p -7a ) and weekends on [email or web address]  Patient seen and examined with the above-signed Advanced Practice Provider and/or Housestaff. I personally reviewed laboratory data, imaging studies and relevant notes. I independently examined the patient and formulated the important aspects of the plan. I have edited the note to reflect any of my changes or salient points. I have personally discussed the plan with the patient and/or family.  He remains very anxious but is more oriented today. Volume status markedly elevated again after he drank alot of fluid overnight. PT recommending SNF but he is refusing. SW working with his father to see what alternate plans might be. Will continue IV diuresis and IV dobutamine. Psych consult recommending increasing SSRI.   VAD parameters stable. INR ok. Discussed with PharmD.   Glori Bickers, MD  7:01 PM

## 2017-04-14 NOTE — Care Management Note (Signed)
Case Management Note Original Note Created by Irven Shelling RN, BSN Unit 2W-Case Manager- [place] coverage [phone]  Patient Details  Name: Ronald Miller. MRN: [id number] Date of Birth: 02/03/1956  Subjective/Objective:  LVAD pt- admitted with AMS- confusion                   Action/Plan: PTA pt lived at home- active/drives- was active with [facility] for [facility] services for RN-and home IV Dobutamine- PT eval pending- if pt returns home will need resumption orders for discharge and IV needs. - CM to follow  Expected Discharge Date:                  Expected Discharge Plan:  [place]  In-House Referral:     Discharge planning Services  CM Consult  Post Acute Care Choice:  [place], Resumption of Svcs/PTA Provider Choice offered to:  Patient  DME Arranged:    DME Agency:     HH Arranged:  RN, Disease Management [place] Agency:  [place]  Status of Service:  In process, will continue to follow  If discussed at Long Length of Stay Meetings, dates discussed:    Discharge Disposition:   Additional Comments: 04/14/2017 SNF recommended - CSW following Maryclare Labrador, RN 04/14/2017, 12:03 PM

## 2017-04-14 NOTE — Progress Notes (Signed)
ANTICOAGULATION CONSULT NOTE - Follow Up Consult  Pharmacy Consult for Coumadin Indication: LVAD  Allergies  Allergen Reactions  . Codeine Nausea And Vomiting and Other (See Comments)    In high doses  . Trazodone And Nefazodone Other (See Comments)    Dizziness with subsequent fall   . Lipitor [Atorvastatin] Nausea Only and Other (See Comments)    Nausea with high doses, tolerates 20mg  dose (08/22/16)  . Tape Other (See Comments)    Paper tape is ok    Patient Measurements: Height: 5\' 9"  (175.3 cm) Weight: 194 lb 3.6 oz (88.1 kg) IBW/kg (Calculated) : 70.7  Vital Signs: Temp: 97.7 F (36.5 C) (07/12 0741) Temp Source: Oral (07/12 0741) BP: 84/61 (07/12 0741) Pulse Rate: 83 (07/12 0741)  Labs:  Recent Labs  04/12/17 0844 04/13/17 0500 04/14/17 0500  HGB 10.9* 10.2* 9.6*  HCT 36.2* 33.8* 31.8*  PLT 230 190 201  LABPROT 22.4* 26.7* 30.0*  INR 1.94 2.42 2.79  CREATININE 1.75* 1.75* 1.23    Estimated Creatinine Clearance: 70.2 mL/min (by C-G formula based on SCr of 1.23 mg/dL).  Assessment: 61yom continues on coumadin for his LVAD. INR slightly above goal at 2.79. CBC stable, LDH up 185 > 385. No bleeding. Continues on amiodarone as pta.  Home dose: 15mg  daily except 10mg  on Tuesdays (last clinic visit 7/7)  Goal of Therapy:  INR 2-2.5 Monitor platelets by anticoagulation protocol: Yes   Plan:  1) Coumadin 5mg  tonight 2) Daily INR, CBC, LDH  Deboraha Sprang 04/14/2017,10:36 AM

## 2017-04-14 NOTE — Progress Notes (Signed)
Patient is well know to this CSW from the [facility]. CSW was asked to touch base with patient by Tanda Rockers, VAD Coordinator due to increased reports of anxiety during the night. Patient was lying in bed and stated "I have so messed up my life". Patient shared frustrations and past life experineces that have led to his current situation. CSW discussed motivation for VAD implant and noted some of the advances patient has made with his recovery. Patient states he wants to improve his life and is motivated but admits to being lonely and states "I have no friends". Patient had developed a relationship with one of the VAD patients and CSW encouraged patient to reach out to the other VAD patient. He states he would like to get back into church and get involved with activities. Patient also open to attending AA/NA meetings where he could meet up with others with shared experiences. Patient states plans to return home post discharge and motivated for improved life experiences. CSW encouraged patient to attend VAD support group this coming Monday as well. Patient appears to be lonely and isolated which is contributing to his anxiety. CSW will continue to follow for additional support systems and encourage patient to attend meetings and support groups. Raquel Sarna, [place], [place]

## 2017-04-15 ENCOUNTER — Ambulatory Visit: Payer: Self-pay

## 2017-04-15 LAB — BASIC METABOLIC PANEL
ANION GAP: 10 (ref 5–15)
BUN: 30 mg/dL — ABNORMAL HIGH (ref 6–20)
CALCIUM: 8.2 mg/dL — AB (ref 8.9–10.3)
CO2: 31 mmol/L (ref 22–32)
CREATININE: 1.2 mg/dL (ref 0.61–1.24)
Chloride: 90 mmol/L — ABNORMAL LOW (ref 101–111)
Glucose, Bld: 170 mg/dL — ABNORMAL HIGH (ref 65–99)
Potassium: 3 mmol/L — ABNORMAL LOW (ref 3.5–5.1)
SODIUM: 131 mmol/L — AB (ref 135–145)

## 2017-04-15 LAB — GLUCOSE, CAPILLARY
GLUCOSE-CAPILLARY: 178 mg/dL — AB (ref 65–99)
Glucose-Capillary: 229 mg/dL — ABNORMAL HIGH (ref 65–99)
Glucose-Capillary: 145 mg/dL — ABNORMAL HIGH (ref 65–99)
Glucose-Capillary: 172 mg/dL — ABNORMAL HIGH (ref 65–99)

## 2017-04-15 LAB — COOXEMETRY PANEL
CARBOXYHEMOGLOBIN: 2 % — AB (ref 0.5–1.5)
Methemoglobin: 1 % (ref 0.0–1.5)
O2 SAT: 62.2 %
TOTAL HEMOGLOBIN: 9.9 g/dL — AB (ref 12.0–16.0)

## 2017-04-15 LAB — CBC
HEMATOCRIT: 32.3 % — AB (ref 39.0–52.0)
Hemoglobin: 9.7 g/dL — ABNORMAL LOW (ref 13.0–17.0)
MCH: 23.5 pg — ABNORMAL LOW (ref 26.0–34.0)
MCHC: 30 g/dL (ref 30.0–36.0)
MCV: 78.2 fL (ref 78.0–100.0)
PLATELETS: 207 10*3/uL (ref 150–400)
RBC: 4.13 MIL/uL — ABNORMAL LOW (ref 4.22–5.81)
RDW: 19.6 % — AB (ref 11.5–15.5)
WBC: 7.4 10*3/uL (ref 4.0–10.5)

## 2017-04-15 LAB — LACTATE DEHYDROGENASE: LDH: 206 U/L — ABNORMAL HIGH (ref 98–192)

## 2017-04-15 LAB — PROTIME-INR
INR: 2.84
PROTHROMBIN TIME: 30.4 s — AB (ref 11.4–15.2)

## 2017-04-15 MED ORDER — DOBUTAMINE IN D5W 4-5 MG/ML-% IV SOLN: 6.0000 ug/kg/min | INTRAVENOUS | 6 refills | Status: DC

## 2017-04-15 MED ORDER — CLONAZEPAM 0.25 MG PO TBDP
0.2500 mg | ORAL_TABLET | Freq: Two times a day (BID) | ORAL | 0 refills | Status: DC
Start: 2017-04-15 — End: 2017-05-09

## 2017-04-15 MED ORDER — TRAMADOL HCL 50 MG PO TABS: 50.0000 mg | ORAL_TABLET | Freq: Three times a day (TID) | ORAL | 0 refills | Status: DC | PRN

## 2017-04-15 MED ORDER — CITALOPRAM HYDROBROMIDE 20 MG PO TABS: 20.0000 mg | ORAL_TABLET | Freq: Every day | ORAL | 6 refills | Status: DC

## 2017-04-15 MED ORDER — POTASSIUM CHLORIDE CRYS ER 20 MEQ PO TBCR
20.0000 meq | EXTENDED_RELEASE_TABLET | Freq: Once | ORAL | Status: AC
  Administered 2017-04-15: 20 meq via ORAL
  Filled 2017-04-15: qty 1

## 2017-04-15 MED ORDER — WARFARIN SODIUM 5 MG PO TABS
5.0000 mg | ORAL_TABLET | Freq: Once | ORAL | Status: AC
  Administered 2017-04-15: 5 mg via ORAL
  Filled 2017-04-15: qty 1

## 2017-04-15 MED ORDER — TORSEMIDE 20 MG PO TABS
40.0000 mg | ORAL_TABLET | Freq: Once | ORAL | Status: AC
  Administered 2017-04-15: 40 mg via ORAL
  Filled 2017-04-15: qty 2

## 2017-04-15 MED ORDER — TORSEMIDE 20 MG PO TABS
80.0000 mg | ORAL_TABLET | Freq: Two times a day (BID) | ORAL | Status: DC
  Administered 2017-04-15 – 2017-04-16 (×2): 80 mg via ORAL
  Filled 2017-04-15: qty 4

## 2017-04-15 NOTE — Progress Notes (Signed)
ANTICOAGULATION CONSULT NOTE - Follow Up Consult  Pharmacy Consult for Coumadin Indication: LVAD  Allergies  Allergen Reactions  . Codeine Nausea And Vomiting and Other (See Comments)    In high doses  . Trazodone And Nefazodone Other (See Comments)    Dizziness with subsequent fall   . Lipitor [Atorvastatin] Nausea Only and Other (See Comments)    Nausea with high doses, tolerates 20mg  dose (08/22/16)  . Tape Other (See Comments)    Paper tape is ok    Patient Measurements: Height: 5\' 9"  (175.3 cm) Weight: 210 lb 1.6 oz (95.3 kg) IBW/kg (Calculated) : 70.7  Vital Signs: Temp: 97.7 F (36.5 C) (07/13 0700) Temp Source: Oral (07/13 0700) BP: 92/80 (07/13 0700) Pulse Rate: 78 (07/13 0700)  Labs:  Recent Labs  04/13/17 0500 04/14/17 0500 04/15/17 0451  HGB 10.2* 9.6* 9.7*  HCT 33.8* 31.8* 32.3*  PLT 190 201 207  LABPROT 26.7* 30.0* 30.4*  INR 2.42 2.79 2.84  CREATININE 1.75* 1.23 1.20    Estimated Creatinine Clearance: 74.5 mL/min (by C-G formula based on SCr of 1.2 mg/dL).  Assessment: 61yom continues on coumadin for his LVAD. INR slightly above goal at 2.84. CBC stable, LDH back down 185>385>206. No bleeding. Continues on amiodarone as pta.  Home dose: 15mg  daily except 10mg  on Tuesdays (last clinic visit 7/7)  Goal of Therapy:  INR 2-2.5 Monitor platelets by anticoagulation protocol: Yes   Plan:  1) Coumadin 5mg  tonight 2) Daily INR, CBC, LDH  Deboraha Sprang 04/15/2017,11:02 AM

## 2017-04-15 NOTE — Progress Notes (Signed)
LVAD Coordinator Rounding Note:  Admitted 7/9/2018due to altered mental status.   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 Prescott Gum.   Vital signs: HR: 78 vpaced Doppler Pressure: 86 Automatic BP: 92/80 (86) O2 Sat: 92 RA Wt: 207 lbs>209>194>210lbs  LVAD interrogation reveals:  Speed:5900 Flow: 5.6 Power: 4.7 PI: 2.9 Alarms: none Events: 14 PI events daily Fixed speed: 5900 Low speed limit: 5600 HCT: 32  Drive Line: Weekly. Ronald Miller is changing dressing today.  Labs:  LDH trend: 251>185>385>206  INR trend: 2.25>2.42>2.79>2.84  Anticoagulation Plan: -INR Goal: 2-2.5 -ASA Dose: 81 mg  Adverse Events on VAD: -11/30/16> Milrinone gtt at discharge -01/27/17>dobutamine gtt at discharge  Gtts: Dobutamine 6 mcg   Plan/Recommendations:   1.Continue supportive care. 2. Possible d/c today.   Tanda Rockers RN, VAD Coordinator 24/7 pager [phone]

## 2017-04-15 NOTE — Care Management Note (Signed)
Case Management Note  Patient Details  Name: Ronald Miller. MRN: [id number] Date of Birth: Sep 06, 1956  Subjective/Objective:                 Patient to [facility] as faciliated by CSW. Patient active w [facility] for dobutamine pta. Durene Fruits [facility] notified of DC today to SNF as patient will need dobutamine gtt switched over prior to transport. Spoke w CSW Judson Roch B that patient will require ACLS transport such as [facility] due to drip.    Action/Plan:  Will DC today to SNF.  Expected Discharge Date:  04/15/17               Expected Discharge Plan:  [place]  In-House Referral:     Discharge planning Services  CM Consult  Post Acute Care Choice:  Home Health, Resumption of Svcs/PTA Provider Choice offered to:  Patient  DME Arranged:  IV pump/equipment DME Agency:  [place] Arranged:  RN, Disease Management [place] Agency:  [place]  Status of Service:  Completed, signed off  If discussed at [place] of Stay Meetings, dates discussed:    Additional Comments:  Carles Collet, RN 04/15/2017, 3:34 PM

## 2017-04-15 NOTE — Progress Notes (Signed)
1437 Came to see pt to walk. Declined at this time. Emotional support given as pt stated he had to make some tough decisions today. Graylon Good RN BSN 04/15/2017 2:38 PM
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
## 2017-04-15 NOTE — Progress Notes (Signed)
Message sent regarding CD IOP status and Klonopin RX

## 2017-04-15 NOTE — Progress Notes (Addendum)
Advanced Heart Failure VAD Team Note  Subjective:    Admitted with AMS. UDS and ammonia ok. No ETOH. Continues on Dobutamine 6 mcg.     CO-OX 62.2% this am. % this am. Weight 210 lbs. (Weight inaccurate yesterday -> bed weight) Out 4.4L total this admission.  CVP 7-8  Slept well last night. Feeling better. Says he can't go home today because "noone is there". States SW is seeing if his dad will stay with him. He has not spoken to his dad yet.   LVAD INTERROGATION:  HeartMate II LVAD:  Flow 5.8 liters/min, speed 5900, power 5.0, PI 3. 27 PI events.    Objective:    Vital Signs:   Temp:  [97.5 F (36.4 C)-98.7 F (37.1 C)] 98.7 F (37.1 C) (07/13 0342) Pulse Rate:  [63-79] 74 (07/12 2320) Resp:  [16-20] 18 (07/12 2320) BP: (81-101)/(71-82) 81/71 (07/13 0342) SpO2:  [91 %-94 %] 91 % (07/12 2320) Weight:  [210 lb 1.6 oz (95.3 kg)] 210 lb 1.6 oz (95.3 kg) (07/13 0626) Last BM Date: 04/11/17 Mean arterial Pressure 81  Intake/Output:   Intake/Output Summary (Last 24 hours) at 04/15/17 0800 Last data filed at 04/15/17 0451  Gross per 24 hour  Intake           1928.5 ml  Output             1825 ml  Net            103.5 ml     Physical Exam   CVP 7-8  GENERAL: Well appearing this am. NAD.  HEENT: Normal. NECK: Supple, JVP 7-8 cm. Carotids OK.  CARDIAC:  Mechanical heart sounds with LVAD hum present.  LUNGS:  CTAB, normal effort.  ABDOMEN:  NT, ND, no HSM. No bruits or masses. +BS  LVAD exit site: Well-healed and incorporated. Dressing dry and intact. No erythema or drainage. Stabilization device present and accurately applied. Driveline dressing changed daily per sterile technique. EXTREMITIES:  Warm and dry. No cyanosis, clubbing, rash, or edema.  NEUROLOGIC:  Alert & oriented x 3. Cranial nerves grossly intact. Moves all 4 extremities w/o difficulty. Affect pleasant      Telemetry   Personally reviewed, V paced 80s     EKG    N/A  Labs   Basic Metabolic  Panel:  Recent Labs Lab 04/10/17 2140 04/10/17 2150 04/11/17 1129 04/12/17 0156 04/12/17 0844 04/12/17 1343 04/13/17 0500 04/14/17 0500  NA 124* 125* 125*  --  131*  --  128* 127*  K 6.3* 6.3* 4.5 2.9* 3.2* 3.8 3.4* 4.0  CL 88* 88* 87*  --  86*  --  89* 92*  CO2 27  --  27  --  32  --  30 27  GLUCOSE 211* 213* 235*  --  105*  --  163* 174*  BUN 33* 38* 38*  --  33*  --  32* 26*  CREATININE 1.82* 1.80* 1.92*  --  1.75*  --  1.75* 1.23  CALCIUM 9.2  --  9.3  --  9.6  --  8.8* 8.1*  MG  --   --   --  2.1 1.9  --   --   --     Liver Function Tests:  Recent Labs Lab 04/10/17 2140 04/11/17 1129  AST 37 38  ALT 36 38  ALKPHOS 82 81  BILITOT 0.8 1.2  PROT 8.0 8.3*  ALBUMIN 4.3 4.4   No results for input(s): LIPASE, AMYLASE in  the last 168 hours.  Recent Labs Lab 04/11/17 1129 04/12/17 1917  AMMONIA 32 29    CBC:  Recent Labs Lab 04/10/17 1920  04/11/17 1129 04/12/17 0844 04/13/17 0500 04/14/17 0500 04/15/17 0451  WBC 9.4  --  8.4 9.0 7.4 7.2 7.4  NEUTROABS 7.9*  --   --   --   --   --   --   HGB 10.1*  < > 10.3* 10.9* 10.2* 9.6* 9.7*  HCT 32.5*  < > 33.6* 36.2* 33.8* 31.8* 32.3*  MCV 77.4*  --  77.8* 78.4 77.3* 77.8* 78.2  PLT 210  --  215 230 190 201 207  < > = values in this interval not displayed.  INR:  Recent Labs Lab 04/11/17 1129 04/12/17 0844 04/13/17 0500 04/14/17 0500 04/15/17 0451  INR 2.25 1.94 2.42 2.79 2.84    Other results:  EKG:    Imaging   No results found.   Medications:     Scheduled Medications: . amiodarone  200 mg Oral Daily  . aspirin EC  81 mg Oral Daily  . Chlorhexidine Gluconate Cloth  6 each Topical Daily  . citalopram  20 mg Oral Daily  . clonazepam  0.25 mg Oral BID  . docusate sodium  200 mg Oral QHS  . gabapentin  200 mg Oral BID  . insulin aspart  0-20 Units Subcutaneous TID WC  . insulin aspart  10 Units Subcutaneous TID WC  . insulin glargine  20 Units Subcutaneous QHS  . magnesium oxide  400  mg Oral Daily  . pantoprazole  40 mg Oral Daily  . potassium chloride  40 mEq Oral BID  . senna-docusate  2 tablet Oral QODAY  . sildenafil  40 mg Oral TID  . sodium chloride flush  10-40 mL Intracatheter Q12H  . torsemide  80 mg Oral BID  . Warfarin - Pharmacist Dosing Inpatient   Does not apply q1800    Infusions: . DOBUTamine 6 mcg/kg/min (04/15/17 0400)    PRN Medications: acetaminophen, ondansetron (ZOFRAN) IV, ondansetron, sodium chloride flush   Patient Profile  Mr Degroff is a 61 year old with a history of HM3, chronic systolic heart failure, medtronic ICD, CAD, S/P CABG, HTN, cocaine abuse, tobacco abuse, PAF, COPD admitted with AMS.   He has struggled in the community requiring multiple visit in the clinic for various reasons including volume overload and coping strategies.   Assessment/Plan:   1. AMS- Ammonia, ETOH, Drug screen ok. CT of head negative 04/10/17 - Ammonia normal. Remains highly anxious. Tox screen negative.  - Resolved.  2. Depression- - Continue Celexa at 20 mg daily (increased per Psych) - Continue prn klonopin. - Better night last night.  3. Chronic Systolic Heart Failure- Biventricular Heart Failure. Complicated by RV failure. HMIII. Continue 81 mg asa daily + coumadin. INR 2.84. LDH pending.   - Coox 62.2% this am on dobutamine 6 mg/kg/min.  - CVP 7-8. Will give 40 mg torsemide this am with increased PI events overnight.    May need to cut back with depression and ? Less po intake.  - Continue sildenafil.  - No BB with RV failure 4. PAF-  - Continue amio 200 mg daily. No change.  6.  Hyponatremia- - BMET pending. Free water restrict.   7. Anemia- last received feraheme 6//18/18.  - Hgb stable at 9.7. Follow.  8. DMII- continue home insulin.  - Continue SSI. CBGs 200s this am.   9. CAD- Severe 3v  CAD S/P CABG with occluded grafts except LIMA.  - No CP. No s/s of ischemia. No change.  10. Chronic Anticoagulation- on coumadin .  - INR goal  2.0-2.5. INR 2.84 this am. Discussed dosing with Pharm-D. If home today will need check early next week.  11. Hypokalemia - BMET pending. Will follow.    Disposition pending. Long talk with [place] yesterday concerning therapies, group sessions, and reaching out to other LVAD patients for support. PT recommends SNF but pt refuses, and now A&O x 3.   I reviewed the LVAD parameters from today, and compared the results to the patient's prior recorded data.  No programming changes were made.  The LVAD is functioning within specified parameters.  The patient performs LVAD self-test daily.  LVAD interrogation was negative for any significant power changes, alarms or PI events/speed drops.  LVAD equipment check completed and is in good working order.  Back-up equipment present.   LVAD education done on emergency procedures and precautions and reviewed exit site care.  Length of Stay: 4  Annamaria Helling 04/15/2017, 8:00 AM  VAD Team --- VAD ISSUES ONLY--- Pager [phone] (7am - 7am)  Advanced Heart Failure Team  Pager [phone] (M-F; 7a - 4p)  Please contact [facility] for night-coverage after hours (4p -7a ) and weekends on [email or web address]  Patient seen and examined with the above-signed Advanced Practice Provider and/or Housestaff. I personally reviewed laboratory data, imaging studies and relevant notes. I independently examined the patient and formulated the important aspects of the plan. I have edited the note to reflect any of my changes or salient points. I have personally discussed the plan with the patient and/or family.  He is back to baseline today though still with anxiety. CVP ok.   PT and SW recommending SNF but he is resistant to this. I told him that I did not feel comfortable letting him go home by himself and would require SNF if he does not have enough family support. We will talk to his sister and father this am. If they are able to help him can go home this afternoon.  Otherwise would make plans for d/c to [facility] later today. VAD parameters stable. He is not a transplant candidate.   Glori Bickers, MD  10:36 AM
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
## 2017-04-15 NOTE — Clinical Social Work Note (Addendum)
CSW faxed clinicals to [facility] for review for insurance authorization.   , CSW [phone]  12:48 pm PASARR still pending. CSW met with patient who confirmed that he is still agreeable to [facility]. He stated his sister will be the best contact for his paperwork at the facility. CSW left her a voicemail.   , CSW [phone]  1:24 pm CSW confirmed with [facility] that they have received the clinicals. They have been assigned to Chalawn M., pre-service coordinator. CSW received call back from patient's sister. She is unable to leave work to do the admissions paperwork at [facility] but is agreeable to them faxing it to her. Admissions coordinator notified.    , CSW [phone]  2:13 pm PASARR and insurance auth still pending.   , CSW [phone]  4:15 pm PASARR and insurance auth still pending.   , CSW [phone]  4:51 pm Patient will require peer-to-peer review. MD's name and phone number provided to pre-service coordinator. PASARR still pending.   , CSW [phone]  5:00 pm Insurance authorization approved: [id number] rug level RVA. PASARR still pending. CSW set auth approval date for tomorrow since PASARR is not yet obtained. Auth will be good for 48 hours.   , CSW [phone]  

## 2017-04-16 ENCOUNTER — Telehealth (HOSPITAL_COMMUNITY): Payer: Self-pay | Admitting: Unknown Physician Specialty

## 2017-04-16 LAB — CBC
HEMATOCRIT: 34.4 % — AB (ref 39.0–52.0)
Hemoglobin: 10.3 g/dL — ABNORMAL LOW (ref 13.0–17.0)
MCH: 23.6 pg — ABNORMAL LOW (ref 26.0–34.0)
MCHC: 29.9 g/dL — AB (ref 30.0–36.0)
MCV: 78.7 fL (ref 78.0–100.0)
Platelets: 226 10*3/uL (ref 150–400)
RBC: 4.37 MIL/uL (ref 4.22–5.81)
RDW: 19.7 % — AB (ref 11.5–15.5)
WBC: 7.8 10*3/uL (ref 4.0–10.5)

## 2017-04-16 LAB — GLUCOSE, CAPILLARY
GLUCOSE-CAPILLARY: 137 mg/dL — AB (ref 65–99)
GLUCOSE-CAPILLARY: 133 mg/dL — AB (ref 65–99)

## 2017-04-16 LAB — BASIC METABOLIC PANEL
Anion gap: 9 (ref 5–15)
BUN: 30 mg/dL — AB (ref 6–20)
CALCIUM: 8.8 mg/dL — AB (ref 8.9–10.3)
CO2: 37 mmol/L — AB (ref 22–32)
CREATININE: 1.23 mg/dL (ref 0.61–1.24)
Chloride: 89 mmol/L — ABNORMAL LOW (ref 101–111)
GFR calc Af Amer: >60 mL/min (ref >60)
GFR calc non Af Amer: >60 mL/min (ref >60)
GLUCOSE: 152 mg/dL — AB (ref 65–99)
Potassium: 3 mmol/L — ABNORMAL LOW (ref 3.5–5.1)
Sodium: 135 mmol/L (ref 135–145)

## 2017-04-16 LAB — COOXEMETRY PANEL
CARBOXYHEMOGLOBIN: 2.2 % — AB (ref 0.5–1.5)
Methemoglobin: 0.9 % (ref 0.0–1.5)
O2 Saturation: 63 %
Total hemoglobin: 9.7 g/dL — ABNORMAL LOW (ref 12.0–16.0)

## 2017-04-16 LAB — PROTIME-INR
INR: 2.55
Prothrombin Time: 27.9 seconds — ABNORMAL HIGH (ref 11.4–15.2)

## 2017-04-16 LAB — LACTATE DEHYDROGENASE: LDH: 246 U/L — ABNORMAL HIGH (ref 98–192)

## 2017-04-16 MED ORDER — WARFARIN SODIUM 10 MG PO TABS: 10.0000 mg | ORAL_TABLET | Freq: Once | ORAL | Status: DC

## 2017-04-16 NOTE — Progress Notes (Signed)
Clinical Social Worker facilitated patient discharge including contacting patient family and facility to confirm patient discharge plans.  Clinical information faxed to facility and family agreeable with plan. RN Whitney to call [facility] for patients transport to [facility] .  RN to call [phone] (pt will go in rm [phone]) report prior to discharge.  Clinical Social Worker will sign off for now as social work intervention is no longer needed. Please consult [place] again if new need arises.  Rhea Pink, MSW, [place]

## 2017-04-16 NOTE — Progress Notes (Addendum)
Spoke w [place]. She states that PASSAR has been received and insurance has authorized stay as of this morning. Patient will DC w dobutamine gtt. Supplied through [place], [place] notified 7/13 to reconnect at time of DC. CM notified Burnis [facility] [phone] that patient is ready for DC this morning since PASSAR has been received. An [facility] RN will come to the unit and attach it prior to the patient discharging to SNF. Patient will require transport w [facility] ([facility] [phone] or [phone]) to SNF due to dobutamine drip.
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
## 2017-04-16 NOTE — Progress Notes (Signed)
  Chart reviewed.   Labs and vital signs remain stable.   Co-ox 63%   Insurance approval now obtained to transfer to [facility]. Discussed with [facility] for dobutamine. Discussed with 2C Agricultural consultant.  Patient to be transferred to SNF today.   Glori Bickers, MD  11:32 AM

## 2017-04-16 NOTE — Telephone Encounter (Signed)
receved a page from bedside regarding pts discharge to [facility]. Nurse was instructed to send the pts battery charger, power module, 6 batteries and the pts black travel bag with [facility]. Nurse was instructed that the power module must be plugged in as soon as the pt arrives to [facility]. Nurse verbalized understanding of all instructions given. Call with questions.   Tanda Rockers RN, BSN  VAD Coordinator

## 2017-04-16 NOTE — Telephone Encounter (Signed)
Received page from bedside nurse regarding the patients discharge to [facility]. Nurse was instructed to send Battery Charger, Power Module and

## 2017-04-16 NOTE — Progress Notes (Signed)
ANTICOAGULATION CONSULT NOTE - Follow Up Consult  Pharmacy Consult for Coumadin Indication: LVAD  Allergies  Allergen Reactions  . Codeine Nausea And Vomiting and Other (See Comments)    In high doses  . Trazodone And Nefazodone Other (See Comments)    Dizziness with subsequent fall   . Lipitor [Atorvastatin] Nausea Only and Other (See Comments)    Nausea with high doses, tolerates 20mg  dose (08/22/16)  . Tape Other (See Comments)    Paper tape is ok    Patient Measurements: Height: 5\' 9"  (175.3 cm) Weight: 208 lb 1.6 oz (94.4 kg) IBW/kg (Calculated) : 70.7  Vital Signs: Temp: 98.5 F (36.9 C) (07/14 0753) Temp Source: Oral (07/14 0753) BP: 93/73 (07/14 0753) Pulse Rate: 77 (07/14 0753)  Labs:  Recent Labs  04/14/17 0500 04/15/17 0451 04/16/17 0344  HGB 9.6* 9.7* 10.3*  HCT 31.8* 32.3* 34.4*  PLT 201 207 226  LABPROT 30.0* 30.4* 27.9*  INR 2.79 2.84 2.55  CREATININE 1.23 1.20 1.23    Estimated Creatinine Clearance: 72.4 mL/min (by C-G formula based on SCr of 1.23 mg/dL).  Assessment: 61yom continues on coumadin for his LVAD. INR at goal at 2.55. CBC stable, LDH back down 185>385>206. No bleeding. Continues on amiodarone as pta.  Home dose: 15mg  daily except 10mg  on Tuesdays (last clinic visit 7/7)  Goal of Therapy:  INR 2-2.5 Monitor platelets by anticoagulation protocol: Yes   Plan:  1) Coumadin 10mg  tonight - plan to dc to SNF and  Can restart home dose at that time 2) Daily INR, CBC, LDH  Bonnita Nasuti Pharm.D. CPP, BCPS Clinical Pharmacist [phone] 04/16/2017 8:31 AM

## 2017-04-18 ENCOUNTER — Ambulatory Visit: Payer: Self-pay

## 2017-04-18 ENCOUNTER — Ambulatory Visit (HOSPITAL_COMMUNITY): Payer: Self-pay | Admitting: Pharmacist

## 2017-04-18 ENCOUNTER — Other Ambulatory Visit (HOSPITAL_COMMUNITY): Payer: Medicare HMO | Admitting: Psychiatry

## 2017-04-18 DIAGNOSIS — Z95811 Presence of heart assist device: Secondary | ICD-10-CM

## 2017-04-18 LAB — PROTIME-INR: INR: 1.7 — AB (ref <=1.1)

## 2017-04-19 ENCOUNTER — Telehealth: Payer: Self-pay | Admitting: Licensed Clinical Social Worker

## 2017-04-19 NOTE — Telephone Encounter (Signed)
CSW contacted patient at [facility] to offer support and follow up. Patient reports he is doing well and feels like he is making improvements. Patient states plan is to return home to independent living upon discharge from SNF. CSW offered supportive intervention and encouragement. Patient sounded motivated and will contact CSW when discharge plan in place. CSW available as needed. Raquel Sarna, [place], [place]

## 2017-04-20 ENCOUNTER — Ambulatory Visit: Payer: Self-pay

## 2017-04-20 ENCOUNTER — Other Ambulatory Visit (HOSPITAL_COMMUNITY): Payer: Medicare HMO

## 2017-04-21 ENCOUNTER — Telehealth (HOSPITAL_COMMUNITY): Payer: Self-pay | Admitting: *Deleted

## 2017-04-21 ENCOUNTER — Other Ambulatory Visit (HOSPITAL_COMMUNITY): Payer: Medicare HMO

## 2017-04-21 NOTE — Telephone Encounter (Signed)
Ronald Miller, called to report 7 lb wt gain over last 5 days. Wt up from 215 - 222.6 lbs, denies any HF sxs, nurse reports mild ankle edema. Confirmed pt is non Spiro 25 mg daily, Torsemide 80 mg bid. Per Dr. Aundra Dubin no additional interventions at this time, pt is scheduled to see [place] in VAD clinic tomorrow. If symptoms present, nurse instructed to call VAD pager.

## 2017-04-22 ENCOUNTER — Ambulatory Visit (HOSPITAL_COMMUNITY)
Admission: RE | Admit: 2017-04-22 | Discharge: 2017-04-22 | Disposition: A | Discharge status: Home / Self Care | Payer: Medicare HMO | Source: Ambulatory Visit | Attending: Cardiology | Admitting: Cardiology

## 2017-04-22 ENCOUNTER — Ambulatory Visit: Payer: Self-pay

## 2017-04-22 ENCOUNTER — Telehealth (HOSPITAL_COMMUNITY): Payer: Self-pay

## 2017-04-22 ENCOUNTER — Telehealth: Payer: Self-pay | Admitting: Physician Assistant

## 2017-04-22 ENCOUNTER — Ambulatory Visit (HOSPITAL_COMMUNITY): Payer: Self-pay | Admitting: Pharmacist

## 2017-04-22 ENCOUNTER — Other Ambulatory Visit (HOSPITAL_COMMUNITY): Payer: Self-pay | Admitting: Unknown Physician Specialty

## 2017-04-22 VITALS — BP 121/95 | HR 73 | Wt 231.0 lb

## 2017-04-22 DIAGNOSIS — Z951 Presence of aortocoronary bypass graft: Secondary | ICD-10-CM | POA: Diagnosis not present

## 2017-04-22 DIAGNOSIS — Z95811 Presence of heart assist device: Secondary | ICD-10-CM

## 2017-04-22 DIAGNOSIS — Z7901 Long term (current) use of anticoagulants: Secondary | ICD-10-CM

## 2017-04-22 DIAGNOSIS — D649 Anemia, unspecified: Secondary | ICD-10-CM | POA: Diagnosis not present

## 2017-04-22 DIAGNOSIS — Z950 Presence of cardiac pacemaker: Secondary | ICD-10-CM | POA: Diagnosis not present

## 2017-04-22 DIAGNOSIS — I2510 Atherosclerotic heart disease of native coronary artery without angina pectoris: Secondary | ICD-10-CM | POA: Insufficient documentation

## 2017-04-22 DIAGNOSIS — I50810 Right heart failure, unspecified: Secondary | ICD-10-CM

## 2017-04-22 DIAGNOSIS — E119 Type 2 diabetes mellitus without complications: Secondary | ICD-10-CM | POA: Insufficient documentation

## 2017-04-22 DIAGNOSIS — I110 Hypertensive heart disease with heart failure: Secondary | ICD-10-CM | POA: Diagnosis not present

## 2017-04-22 DIAGNOSIS — I82621 Acute embolism and thrombosis of deep veins of right upper extremity: Secondary | ICD-10-CM | POA: Insufficient documentation

## 2017-04-22 DIAGNOSIS — Z794 Long term (current) use of insulin: Secondary | ICD-10-CM | POA: Diagnosis not present

## 2017-04-22 DIAGNOSIS — F329 Major depressive disorder, single episode, unspecified: Secondary | ICD-10-CM | POA: Insufficient documentation

## 2017-04-22 DIAGNOSIS — R413 Other amnesia: Secondary | ICD-10-CM | POA: Diagnosis not present

## 2017-04-22 DIAGNOSIS — I158 Other secondary hypertension: Secondary | ICD-10-CM | POA: Diagnosis not present

## 2017-04-22 DIAGNOSIS — E871 Hypo-osmolality and hyponatremia: Secondary | ICD-10-CM

## 2017-04-22 DIAGNOSIS — I480 Paroxysmal atrial fibrillation: Secondary | ICD-10-CM | POA: Diagnosis not present

## 2017-04-22 DIAGNOSIS — I509 Heart failure, unspecified: Secondary | ICD-10-CM

## 2017-04-22 DIAGNOSIS — F1410 Cocaine abuse, uncomplicated: Secondary | ICD-10-CM | POA: Diagnosis not present

## 2017-04-22 DIAGNOSIS — R040 Epistaxis: Secondary | ICD-10-CM | POA: Insufficient documentation

## 2017-04-22 DIAGNOSIS — I5082 Biventricular heart failure: Secondary | ICD-10-CM | POA: Diagnosis not present

## 2017-04-22 DIAGNOSIS — D509 Iron deficiency anemia, unspecified: Secondary | ICD-10-CM | POA: Diagnosis not present

## 2017-04-22 DIAGNOSIS — Z79899 Other long term (current) drug therapy: Secondary | ICD-10-CM | POA: Diagnosis not present

## 2017-04-22 DIAGNOSIS — Z7982 Long term (current) use of aspirin: Secondary | ICD-10-CM | POA: Insufficient documentation

## 2017-04-22 DIAGNOSIS — J449 Chronic obstructive pulmonary disease, unspecified: Secondary | ICD-10-CM | POA: Insufficient documentation

## 2017-04-22 DIAGNOSIS — I5022 Chronic systolic (congestive) heart failure: Secondary | ICD-10-CM | POA: Diagnosis not present

## 2017-04-22 DIAGNOSIS — I5023 Acute on chronic systolic (congestive) heart failure: Secondary | ICD-10-CM

## 2017-04-22 LAB — BASIC METABOLIC PANEL
Anion gap: 8 (ref 5–15)
BUN: 18 mg/dL (ref 6–20)
CHLORIDE: 90 mmol/L — AB (ref 101–111)
CO2: 33 mmol/L — AB (ref 22–32)
CREATININE: 1.26 mg/dL — AB (ref 0.61–1.24)
Calcium: 8.9 mg/dL (ref 8.9–10.3)
GFR calc Af Amer: >60 mL/min (ref >60)
GFR calc non Af Amer: >60 mL/min (ref >60)
GLUCOSE: 214 mg/dL — AB (ref 65–99)
POTASSIUM: 4.2 mmol/L (ref 3.5–5.1)
SODIUM: 131 mmol/L — AB (ref 135–145)

## 2017-04-22 LAB — CBC
HCT: 31.5 % — ABNORMAL LOW (ref 39.0–52.0)
Hemoglobin: 9.2 g/dL — ABNORMAL LOW (ref 13.0–17.0)
MCH: 23.4 pg — AB (ref 26.0–34.0)
MCHC: 29.2 g/dL — ABNORMAL LOW (ref 30.0–36.0)
MCV: 79.9 fL (ref 78.0–100.0)
PLATELETS: 155 10*3/uL (ref 150–400)
RBC: 3.94 MIL/uL — ABNORMAL LOW (ref 4.22–5.81)
RDW: 19.9 % — AB (ref 11.5–15.5)
WBC: 7.6 10*3/uL (ref 4.0–10.5)

## 2017-04-22 LAB — PROTIME-INR
INR: 3.61
Prothrombin Time: 36.9 seconds — ABNORMAL HIGH (ref 11.4–15.2)

## 2017-04-22 LAB — LACTATE DEHYDROGENASE: LDH: 189 U/L (ref 98–192)

## 2017-04-22 MED ORDER — LOSARTAN POTASSIUM 25 MG PO TABS: 25.0000 mg | ORAL_TABLET | Freq: Every day | ORAL | 3 refills | Status: DC

## 2017-04-22 MED ORDER — TORSEMIDE 20 MG PO TABS: 100.0000 mg | ORAL_TABLET | Freq: Two times a day (BID) | ORAL | 6 refills | Status: DC

## 2017-04-22 MED ORDER — POTASSIUM CHLORIDE ER 20 MEQ PO TBCR: 40.0000 meq | EXTENDED_RELEASE_TABLET | Freq: Three times a day (TID) | ORAL | 6 refills | Status: DC

## 2017-04-22 NOTE — Progress Notes (Signed)
Patient presents for 1 week  follow up in [facility] today. Reports no problems with VAD equipment or concerns with drive line. Pt states that he is feeling fine and is adjusting to being at [facility].  Vital Signs:  Doppler Pressure:102  Automatc BP: 121/95 (105) HR: 73 SPO2:93  %  Weight: 231 lb w/o eqt Last weight: 210 lb-hospital d/c wt without equipment Home weights: 225 lbs   VAD Indication: Destination therapy  VAD interrogation & Equipment Management: Speed: 5900 Flow: 5.3 Power:4.7 w    PI:2.7  Alarms: no clinical alarms Events: 3-5 PI events daily  Fixed speed 5900 Low speed limit: 5600  Primary Controller:  Replace back up battery in 30 months. Back up controller:   Replace back up battery in 28 months.  Annual Equipment Maintenance on UBC/PM was performed on 09/2016.   I reviewed the LVAD parameters from today and compared the results to the patient's prior recorded data. LVAD interrogation was NEGATIVE for significant power changes, NEGATIVE for clinical alarms and STABLE for PI events/speed drops. No programming changes were made and pump is functioning within specified parameters. Pt is performing daily controller and system monitor self tests along with completing weekly and monthly maintenance for LVAD equipment.  LVAD equipment check completed and is in good working order. Back-up equipment present. Charged back up battery and performed self-test on equipment.   Exit Site Care: Drive line is being maintained weekly  by VAD Coordinators. Dressing changed in clinic today. Dressing removed and cleaned with Chloraprep x 2 allowed to dry. Silver disc applied along with split gauze and tape. New anchor applied.  Drive line exit site well healed and incorporated. The velour is fully implanted at exit site. Dressing dry and intact. No erythema or drainage. Stabilization device present and accurately applied. Pt denies fever or chills.   Significant Events  on VAD Support:  11/30/16> Milrinone gtt at discharge 01/27/17>dobutamine at 5 mcg/kg/mingtt at discharge 03/08/17> RV failure  Device:Protect Therapies: off; end of service 08/2016 Last check: complete interrogation today per Darnelle Maffucci (Medtronic). DDD (lower rate 60); BiV pacing with 96% AS-VP  BP & Labs:  MAP102 - Doppler is reflecting modified systolic  Hgb 9.2 - No S/S of bleeding. Specifically denies melena/BRBPR or nosebleeds.  LDH stable at 189 with established baseline of 180- 250. Denies tea-colored urine. No power elevations noted on interrogation.   Plan: 1. Start Losartan 25 mg daily. 2. Increase Potassium to 61mEq 3 times daily. 3. Increase Demadex to 100 mg twice daily.  4. Return to clinic in 1 week.   Tanda Rockers RN [place] Coordinator   Office: [phone] 24/7 Emergency VAD Pager: [phone]

## 2017-04-22 NOTE — Telephone Encounter (Signed)
[facility] called to report patient will be late for apt due to transportation issues. Also reports patient has not been able to receive IV lasix ordered M W F as they are not able to push over 4-8 minutes with monitoring (unsure if this is facility policy vs staffing issue). VAD Coordiantors made aware.  Renee Pain, RN

## 2017-04-22 NOTE — Telephone Encounter (Signed)
Called  from [place] that one side of port is not flushing. Other side working fine. Advised to call office Monday. If 2nd port fails over weekend --> goes to ER.   Patient also feeling dizziness since OV this morning. HR of 70. Unable to get BP. Advised to check BP manually. Start medication. If BP low and not feeling well-->call on call person or come to ER. Denies chest pain, palpitations, dyspnea, or syncope.

## 2017-04-22 NOTE — Progress Notes (Signed)
Advanced Heart Failure Clinic Note    Referring Provider: Darylene Price, FNP Primary Care: [facility] Primary Cardiologist: Dr Haroldine Laws.  Transplant Center- [facility]  HPI: Ronald Miller. is a 61 y.o. male with history of chronic systolic HF s/p Medtronic ICD 2014, CAD s/p CABG in 2000, HTN, Hx of cocaine abuse, Tobacco abuse, Depression, PAF, and COPD. HM3 placed 09/09/2016.   Admitted 11/2016 with volume overload and low output heart failure. Diuresed with IV lasix and placed on milrinone. Discharged on milrinone.   Admitted 01/21/2017 with volume overload. Mixed venous saturation was low so milrinone was increased to 0.375 mcg. Milrinone was stopped with persistently low mixed venous saturation so dobtuamine was started. He discharged on 0.5 mcg dobutamine. Hospital course complicated by RUE swelling. US showed non occlusive DVT in R brachial vein. PICC removed and later tunneled PICC was placed. Discharge weight 233 pounds.   Underwent ramp RHC on 02/11/17 which showed primarily RV failure with normal output.   VAD speed 5700 On dobutamine 5 mcg/kg/min RA = 17 RV =  44/18 PA =  41/15 (27) PCW = 16 Fick cardiac output/index = 8.1/3.7 Thermo CO/CI = 6.7/3.0 Ao sat = 95% PA sat = 62%, 62%  VAD speed 5900 RA = 16 PA =  44/15 (28) PCW = 15 Fick cardiac output/index = 8.1/3.7 Ao = 95% PA sat = 65%  Admitted 03/04/17-03/08/17 with recurrent right heart failure and severe hyponatremia. CVP > 20. Treated with tolvaptan and IV lasix. Diuresed 11 pounds. D/c weight 223. Also treated with IV feraheme for iron-deficiency anemia. CT chest negative for outlfow tract obstruction. Sent home with order for IV lasix every Monday and Friday.   Admitted 7/9 through 04/16/2017. Admitted for AMS and A/C Systolic Heart Failure. Psychiatry saw and recommended increasing celexa adding prn klonopin. CO-OX low so dobutamine was increased to 6 mcg. Hospital course complicated by fluid  overload. Discharged to [facility]. Discharge weight 210 pounds.   Today he returns for LVAD/HF follow up with dobutamine infusing via PICC. Overall feeling ok. Complains of intermittent memory loss. Still says he is depressed.  Denies PND/Orthopnea. Eating regular diet. Working with therapy at [facility]. No bleeding problems. Denies dizziness. No fever or chills.  He is currently at [facility].   Denies LVAD trauma. Denies driveline trauma, erythema, or drainage. No ICD shocks.   Reports taking Coumadin as prescribed and adherence to anticoagulation based dietary restrictions. Denies bright red blood per rectum or melena, no dark urine or hematuria.  VAD Indication: Destination therapy- eval ongoing at [facility]    VAD interrogation & Equipment Management: Speed:5900 Flow: 5.3 Power: 4.7w PI:2.7   Alarms: no clinical alarms Events: 5-7 PI events daily Fixed speed 5900 Low speed limit: 5600  Primary Controller: Replace back up battery in 36 months. Back up controller: Replace back up battery in 49months.  Past Medical History:  Diagnosis Date  . AICD (automatic cardioverter/defibrillator) present   . ASCVD (arteriosclerotic cardiovascular disease)   . Chronic systolic CHF (congestive heart failure) ([place])   . COPD (chronic obstructive pulmonary disease) ([place])   . Coronary artery disease   . Depression   . Diabetes mellitus   . History of cocaine abuse   . Hypertension   . Presence of permanent cardiac pacemaker   . Shortness of breath dyspnea   . Suicidal ideation   . Tobacco abuse     Current Outpatient Prescriptions  Medication Sig Dispense Refill  . amiodarone (PACERONE) 200  MG tablet Take 1 tablet (200 mg total) by mouth daily. 90 tablet 6  . aspirin EC 81 MG EC tablet Take 1 tablet (81 mg total) by mouth daily. 30 tablet 6  . citalopram (CELEXA) 20 MG tablet Take 1 tablet (20 mg total) by mouth daily. 30 tablet 6  . clonazepam (KLONOPIN) 0.25 MG  disintegrating tablet Take 1 tablet (0.25 mg total) by mouth 2 (two) times daily. 60 tablet 0  . digoxin (LANOXIN) 0.25 MG tablet Take 0.25 mg by mouth daily.     . DOBUTamine (DOBUTREX) 4-5 MG/ML-% infusion Inject 571.8 mcg/min into the vein continuous. PER [facility] pharmacy 250 mL 6  . docusate sodium (COLACE) 100 MG capsule Take 200 mg by mouth at bedtime.    . gabapentin (NEURONTIN) 100 MG capsule Take 2 capsules (200 mg total) by mouth 2 (two) times daily. 180 capsule 3  . insulin aspart (NOVOLOG FLEXPEN) 100 UNIT/ML FlexPen Inject 10 Units into the skin 3 (three) times daily with meals. 15 mL 11  . Insulin Glargine (LANTUS SOLOSTAR) 100 UNIT/ML Solostar Pen Inject 20 Units into the skin daily at 10 pm.    . magnesium oxide (MAG-OX) 400 (241.3 Mg) MG tablet Take 1 tablet (400 mg total) by mouth daily. 30 tablet 6  . Multiple Vitamin (MULTIVITAMIN WITH MINERALS) TABS tablet Take 1 tablet by mouth daily.    . ondansetron (ZOFRAN ODT) 4 MG disintegrating tablet Take 1 tablet (4 mg total) by mouth every 8 (eight) hours as needed for nausea. 6 tablet 0  . pantoprazole (PROTONIX) 40 MG tablet Take 1 tablet (40 mg total) by mouth daily. 30 tablet 6  . Potassium Chloride ER 20 MEQ TBCR Take 40 mEq by mouth 2 (two) times daily. Take extra 20 meq on Monday and Friday. 130 tablet 6  . senna-docusate (SENOKOT S) 8.6-50 MG tablet Take 2 tablets by mouth every other day.    . sildenafil (REVATIO) 20 MG tablet Take 2 tablets (40 mg total) by mouth 3 (three) times daily. 180 tablet 2  . spironolactone (ALDACTONE) 25 MG tablet TAKE 1 TABLET ONE TIME DAILY (Patient taking differently: Take 25 mg by mouth daily) 90 tablet 1  . torsemide (DEMADEX) 20 MG tablet Take 4 tablets (80 mg total) by mouth 2 (two) times daily. 180 tablet 6  . traMADol (ULTRAM) 50 MG tablet Take 1 tablet (50 mg total) by mouth 3 (three) times daily as needed for severe pain. 30 tablet 0  . warfarin (COUMADIN) 5 MG tablet Take 10-15 mg by mouth  See admin instructions. Take 3 tablets (15 mg) daily except 2 tablets (10 mg) on Tuesday.      No current facility-administered medications for this encounter.     Codeine; Trazodone and nefazodone; Lipitor [atorvastatin]; and Tape  REVIEW OF SYSTEMS: All systems negative except as listed in HPI, PMH and Problem list.    There were no vitals filed for this visit. Vitals:   04/22/17 1115  BP: (!) 121/95  Pulse: 73   Vital Signs:  Doppler Pressure 102              Automatc BP: 121/95 HR: 73 SPO2 93  Weight: 231 with eqt Last weight: Hospital 210 but without equipment  Home weights: N/A  Wt Readings from Last 3 Encounters:  04/22/17 231 lb (104.8 kg)  04/16/17 208 lb 1.6 oz (94.4 kg)  03/22/17 234 lb (106.1 kg)     Physical Exam: GENERAL: Well appearing, male who  presents to clinic today in no acute distress. HEENT: normal  NECK: Supple, JVP to jaw.  2+ bilaterally, no bruits.  No lymphadenopathy or thyromegaly appreciated.   CARDIAC:  Mechanical heart sounds with LVAD hum present. R upper chest PICC LUNGS:  Clear to auscultation bilaterally.  ABDOMEN:  Soft, round, nontender, positive bowel sounds x4.  Distended   LVAD exit site: well-healed and incorporated.  Dressing dry and intact.  No erythema or drainage.  Stabilization device present and accurately applied.  Driveline dressing is being changed daily per sterile technique. EXTREMITIES:  Warm and dry, no cyanosis, clubbing, rash. RLE LLE 1+  edema .  NEUROLOGIC:  Alert and oriented x 4.  Gait steady.  No aphasia.  No dysarthria.  Affect pleasant.      ASSESSMENT AND PLAN:  1. Acute/Chronic end-stage biventricular systolic HF: LVEF 63% due to ICM -> s/p Echo 08/24/16 LVEF 15%, RV mild dilated, moderately reduced. --> S/P HM3 LVAD 10/10/107.  - Complicated by RV failure. Initially on milrinone but stopped due to low mixed venous saturation.  Now on dobutamine 6 mcg.  - Continues to struggle with severe RV failure.    - Volume status elevated in the setting of high salt diet. Increase torsemide to 100 mg twice a day. Increase potassium to 40 meq three times a day.  -MAP Elevated add 25 mg losartan.  - No beta blocker with RV failure - Continue digoxin 0.125 mcg daily.  I have also requested lowe sodium diet at SNF.  - Warfarin with goal INR 2.0 - 2.5 + ASA 81 daily. Check INR today.   Had epistaxis with lovenox, will avoid this in the future.  - Continues to follow with [facility] for transplant w/u. They are in processing of reassessing him now. We had a long discussion regarding the criteria for transplant consideration. Stressed need that if he is going to miss appointments with his counselor he needs to call to cancel and let them know why he cannot attend appointment. Overall, I do not think he is ideal transplant candidate at this point.  2. CAD: No CP. 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  -No S/S ischemia  3. DMII:  - Followed by PCP.   4. Tobacco abuse:  - He has stopped smoking.  5. Atrial fibrillation, paroxysmal - Regular pulse. Continue amio 200 mg daily.    6. Left pleural effusion - underwent repeat thoracentesis in Jan. 2018.  - CXR on 7/8 stable. No recurrent effusion.  7.  Anxiety/Depression - Continue Celexa 20 mg daily and klonipin.  8. RUE DVT.  - Continue warfarin.  9. Hyponatremia: BMET today. -  10. Anemia, iron-deficiency:  Recent hgb 9.2  No bleedin problems.     11. HTN- Elevated MAP. Add 25 mg losartan and check MAPs next week.   Follow up next week to reassess volume status and MAPs. Encourage him to participate in group activities in the SNF.  Greater than 50% of the (total minutes 45) visit spent in counseling/coordination of care regarding heart failure diet, diuretics and mobility.   Darrick Grinder, NP  10:06 AM

## 2017-04-25 ENCOUNTER — Telehealth: Payer: Self-pay | Admitting: Licensed Clinical Social Worker

## 2017-04-25 ENCOUNTER — Other Ambulatory Visit (HOSPITAL_COMMUNITY): Payer: Medicare HMO

## 2017-04-25 ENCOUNTER — Ambulatory Visit: Payer: Self-pay

## 2017-04-25 NOTE — Telephone Encounter (Signed)
CSW attempted to contact patient to follow up although no answer. Message left. Raquel Sarna, [place], [place]

## 2017-04-27 ENCOUNTER — Telehealth (HOSPITAL_COMMUNITY): Payer: Self-pay | Admitting: Psychology

## 2017-04-27 ENCOUNTER — Other Ambulatory Visit (HOSPITAL_COMMUNITY): Payer: Medicare HMO

## 2017-04-27 ENCOUNTER — Ambulatory Visit: Payer: Self-pay

## 2017-04-28 ENCOUNTER — Telehealth: Payer: Self-pay | Admitting: Licensed Clinical Social Worker

## 2017-04-28 ENCOUNTER — Other Ambulatory Visit (HOSPITAL_COMMUNITY): Payer: Medicare HMO

## 2017-04-28 NOTE — Telephone Encounter (Signed)
CSW contacted patient to confirm appointment for tomorrow in the clinic. Patient reports he is aware and [facility] will provide transportation to appointment. Patient states he is doing well and will be discharged home on Saturday. CSW offered support and will continue to be available as needed. Raquel Sarna, [place], [place]

## 2017-04-29 ENCOUNTER — Inpatient Hospital Stay (HOSPITAL_COMMUNITY)
Admission: AD | Admit: 2017-04-29 | Discharge: 2017-05-09 | DRG: 377 | Disposition: A | Discharge status: Home Health | Payer: Medicare HMO | Source: Ambulatory Visit | Attending: Internal Medicine | Admitting: Internal Medicine

## 2017-04-29 ENCOUNTER — Ambulatory Visit (HOSPITAL_COMMUNITY)
Admission: RE | Admit: 2017-04-29 | Discharge: 2017-04-29 | Disposition: A | Discharge status: Home / Self Care | Payer: Medicare HMO | Source: Ambulatory Visit | Attending: Cardiology | Admitting: Cardiology

## 2017-04-29 ENCOUNTER — Ambulatory Visit: Payer: Self-pay

## 2017-04-29 ENCOUNTER — Encounter (HOSPITAL_COMMUNITY): Payer: Self-pay

## 2017-04-29 ENCOUNTER — Other Ambulatory Visit (HOSPITAL_COMMUNITY): Payer: Self-pay | Admitting: *Deleted

## 2017-04-29 DIAGNOSIS — Z4509 Encounter for adjustment and management of other cardiac device: Secondary | ICD-10-CM

## 2017-04-29 DIAGNOSIS — K2901 Acute gastritis with bleeding: Secondary | ICD-10-CM | POA: Diagnosis not present

## 2017-04-29 DIAGNOSIS — I25810 Atherosclerosis of coronary artery bypass graft(s) without angina pectoris: Secondary | ICD-10-CM | POA: Diagnosis present

## 2017-04-29 DIAGNOSIS — I130 Hypertensive heart and chronic kidney disease with heart failure and stage 1 through stage 4 chronic kidney disease, or unspecified chronic kidney disease: Secondary | ICD-10-CM | POA: Diagnosis present

## 2017-04-29 DIAGNOSIS — Z87891 Personal history of nicotine dependence: Secondary | ICD-10-CM

## 2017-04-29 DIAGNOSIS — N183 Chronic kidney disease, stage 3 (moderate): Secondary | ICD-10-CM | POA: Diagnosis present

## 2017-04-29 DIAGNOSIS — Z95810 Presence of automatic (implantable) cardiac defibrillator: Secondary | ICD-10-CM

## 2017-04-29 DIAGNOSIS — Z833 Family history of diabetes mellitus: Secondary | ICD-10-CM

## 2017-04-29 DIAGNOSIS — E875 Hyperkalemia: Secondary | ICD-10-CM | POA: Diagnosis present

## 2017-04-29 DIAGNOSIS — F329 Major depressive disorder, single episode, unspecified: Secondary | ICD-10-CM | POA: Diagnosis present

## 2017-04-29 DIAGNOSIS — K2951 Unspecified chronic gastritis with bleeding: Secondary | ICD-10-CM | POA: Diagnosis present

## 2017-04-29 DIAGNOSIS — I429 Cardiomyopathy, unspecified: Secondary | ICD-10-CM | POA: Diagnosis present

## 2017-04-29 DIAGNOSIS — I5082 Biventricular heart failure: Secondary | ICD-10-CM | POA: Diagnosis present

## 2017-04-29 DIAGNOSIS — I2510 Atherosclerotic heart disease of native coronary artery without angina pectoris: Secondary | ICD-10-CM | POA: Diagnosis present

## 2017-04-29 DIAGNOSIS — I509 Heart failure, unspecified: Secondary | ICD-10-CM | POA: Diagnosis not present

## 2017-04-29 DIAGNOSIS — K922 Gastrointestinal hemorrhage, unspecified: Secondary | ICD-10-CM

## 2017-04-29 DIAGNOSIS — N179 Acute kidney failure, unspecified: Secondary | ICD-10-CM | POA: Diagnosis present

## 2017-04-29 DIAGNOSIS — F419 Anxiety disorder, unspecified: Secondary | ICD-10-CM | POA: Diagnosis present

## 2017-04-29 DIAGNOSIS — D500 Iron deficiency anemia secondary to blood loss (chronic): Secondary | ICD-10-CM | POA: Diagnosis present

## 2017-04-29 DIAGNOSIS — J449 Chronic obstructive pulmonary disease, unspecified: Secondary | ICD-10-CM | POA: Diagnosis present

## 2017-04-29 DIAGNOSIS — I480 Paroxysmal atrial fibrillation: Secondary | ICD-10-CM | POA: Diagnosis present

## 2017-04-29 DIAGNOSIS — K921 Melena: Secondary | ICD-10-CM | POA: Diagnosis not present

## 2017-04-29 DIAGNOSIS — K2950 Unspecified chronic gastritis without bleeding: Secondary | ICD-10-CM | POA: Diagnosis not present

## 2017-04-29 DIAGNOSIS — R040 Epistaxis: Secondary | ICD-10-CM | POA: Diagnosis not present

## 2017-04-29 DIAGNOSIS — D175 Benign lipomatous neoplasm of intra-abdominal organs: Secondary | ICD-10-CM | POA: Diagnosis present

## 2017-04-29 DIAGNOSIS — E1122 Type 2 diabetes mellitus with diabetic chronic kidney disease: Secondary | ICD-10-CM | POA: Diagnosis present

## 2017-04-29 DIAGNOSIS — K2971 Gastritis, unspecified, with bleeding: Secondary | ICD-10-CM | POA: Diagnosis not present

## 2017-04-29 DIAGNOSIS — I5022 Chronic systolic (congestive) heart failure: Secondary | ICD-10-CM

## 2017-04-29 DIAGNOSIS — I5023 Acute on chronic systolic (congestive) heart failure: Secondary | ICD-10-CM | POA: Diagnosis present

## 2017-04-29 DIAGNOSIS — D508 Other iron deficiency anemias: Secondary | ICD-10-CM | POA: Diagnosis not present

## 2017-04-29 DIAGNOSIS — E876 Hypokalemia: Secondary | ICD-10-CM | POA: Diagnosis present

## 2017-04-29 DIAGNOSIS — Z95811 Presence of heart assist device: Secondary | ICD-10-CM

## 2017-04-29 DIAGNOSIS — Z86718 Personal history of other venous thrombosis and embolism: Secondary | ICD-10-CM

## 2017-04-29 DIAGNOSIS — Z7901 Long term (current) use of anticoagulants: Secondary | ICD-10-CM | POA: Diagnosis not present

## 2017-04-29 DIAGNOSIS — E871 Hypo-osmolality and hyponatremia: Secondary | ICD-10-CM | POA: Diagnosis present

## 2017-04-29 DIAGNOSIS — D62 Acute posthemorrhagic anemia: Secondary | ICD-10-CM | POA: Diagnosis present

## 2017-04-29 DIAGNOSIS — Z8249 Family history of ischemic heart disease and other diseases of the circulatory system: Secondary | ICD-10-CM

## 2017-04-29 DIAGNOSIS — D509 Iron deficiency anemia, unspecified: Secondary | ICD-10-CM

## 2017-04-29 DIAGNOSIS — K3189 Other diseases of stomach and duodenum: Secondary | ICD-10-CM | POA: Diagnosis not present

## 2017-04-29 DIAGNOSIS — K648 Other hemorrhoids: Secondary | ICD-10-CM | POA: Diagnosis present

## 2017-04-29 DIAGNOSIS — K5900 Constipation, unspecified: Secondary | ICD-10-CM | POA: Diagnosis not present

## 2017-04-29 DIAGNOSIS — K254 Chronic or unspecified gastric ulcer with hemorrhage: Principal | ICD-10-CM | POA: Diagnosis present

## 2017-04-29 DIAGNOSIS — Z79899 Other long term (current) drug therapy: Secondary | ICD-10-CM

## 2017-04-29 LAB — GLUCOSE, CAPILLARY
Glucose-Capillary: 216 mg/dL — ABNORMAL HIGH (ref 65–99)
Glucose-Capillary: 167 mg/dL — ABNORMAL HIGH (ref 65–99)

## 2017-04-29 LAB — VITAMIN B12: VITAMIN B 12: 346 pg/mL (ref 180–914)

## 2017-04-29 LAB — PREPARE RBC (CROSSMATCH)

## 2017-04-29 LAB — MRSA PCR SCREENING: MRSA BY PCR: NEGATIVE

## 2017-04-29 LAB — BASIC METABOLIC PANEL
Anion gap: 9 (ref 5–15)
BUN: 28 mg/dL — ABNORMAL HIGH (ref 6–20)
CHLORIDE: 92 mmol/L — AB (ref 101–111)
CO2: 27 mmol/L (ref 22–32)
CREATININE: 1.44 mg/dL — AB (ref 0.61–1.24)
Calcium: 8.7 mg/dL — ABNORMAL LOW (ref 8.9–10.3)
GFR calc non Af Amer: 51 mL/min — ABNORMAL LOW (ref >60)
GFR, EST AFRICAN AMERICAN: 60 mL/min — AB (ref >60)
GLUCOSE: 205 mg/dL — AB (ref 65–99)
Potassium: 5.2 mmol/L — ABNORMAL HIGH (ref 3.5–5.1)
Sodium: 128 mmol/L — ABNORMAL LOW (ref 135–145)

## 2017-04-29 LAB — CBC
HCT: 24.8 % — ABNORMAL LOW (ref 39.0–52.0)
Hemoglobin: 7.6 g/dL — ABNORMAL LOW (ref 13.0–17.0)
MCH: 23.5 pg — AB (ref 26.0–34.0)
MCHC: 30.6 g/dL (ref 30.0–36.0)
MCV: 76.5 fL — AB (ref 78.0–100.0)
Platelets: 212 10*3/uL (ref 150–400)
RBC: 3.24 MIL/uL — AB (ref 4.22–5.81)
RDW: 20 % — ABNORMAL HIGH (ref 11.5–15.5)
WBC: 7.1 10*3/uL (ref 4.0–10.5)

## 2017-04-29 LAB — PROTIME-INR
INR: 3.7
Prothrombin Time: 37.6 seconds — ABNORMAL HIGH (ref 11.4–15.2)

## 2017-04-29 LAB — FERRITIN: Ferritin: 19 ng/mL — ABNORMAL LOW (ref 24–336)

## 2017-04-29 LAB — LACTATE DEHYDROGENASE: LDH: 186 U/L (ref 98–192)

## 2017-04-29 LAB — IRON AND TIBC
IRON: 20 ug/dL — AB (ref 45–182)
Saturation Ratios: 5 % — ABNORMAL LOW (ref 17.9–39.5)
TIBC: 407 ug/dL (ref 250–450)
UIBC: 387 ug/dL

## 2017-04-29 LAB — RETICULOCYTES
RBC.: 3.12 MIL/uL — ABNORMAL LOW (ref 4.22–5.81)
RETIC CT PCT: 3.2 % — AB (ref 0.4–3.1)
Retic Count, Absolute: 99.8 10*3/uL (ref 19.0–186.0)

## 2017-04-29 LAB — FOLATE: FOLATE: 20.7 ng/mL (ref >5.9)

## 2017-04-29 MED ORDER — DOCUSATE SODIUM 100 MG PO CAPS
200.0000 mg | ORAL_CAPSULE | Freq: Two times a day (BID) | ORAL | Status: DC
  Administered 2017-04-30 – 2017-05-09 (×17): 200 mg via ORAL
  Filled 2017-04-29 (×19): qty 2

## 2017-04-29 MED ORDER — FUROSEMIDE 10 MG/ML IJ SOLN
80.0000 mg | Freq: Once | INTRAMUSCULAR | Status: AC
  Administered 2017-04-29: 80 mg via INTRAVENOUS
  Filled 2017-04-29: qty 8

## 2017-04-29 MED ORDER — ONDANSETRON HCL 4 MG/2ML IJ SOLN
4.0000 mg | Freq: Four times a day (QID) | INTRAMUSCULAR | Status: DC | PRN
  Administered 2017-05-07 (×2): 4 mg via INTRAVENOUS
  Filled 2017-04-29 (×2): qty 2

## 2017-04-29 MED ORDER — CLONAZEPAM 0.125 MG PO TBDP
0.2500 mg | ORAL_TABLET | Freq: Two times a day (BID) | ORAL | Status: DC | PRN
  Administered 2017-04-30 – 2017-05-03 (×6): 0.25 mg via ORAL
  Filled 2017-04-29 (×6): qty 2

## 2017-04-29 MED ORDER — SENNOSIDES-DOCUSATE SODIUM 8.6-50 MG PO TABS
2.0000 | ORAL_TABLET | Freq: Every day | ORAL | Status: DC
Start: 2017-04-30 — End: 2017-05-09
  Administered 2017-04-30 – 2017-05-09 (×10): 2 via ORAL
  Filled 2017-04-29 (×11): qty 2

## 2017-04-29 MED ORDER — ONDANSETRON 4 MG PO TBDP
4.0000 mg | ORAL_TABLET | Freq: Three times a day (TID) | ORAL | Status: DC | PRN
Start: 2017-04-29 — End: 2017-05-09
  Administered 2017-04-30 – 2017-05-02 (×3): 4 mg via ORAL
  Filled 2017-04-29 (×4): qty 1

## 2017-04-29 MED ORDER — INSULIN ASPART 100 UNIT/ML ~~LOC~~ SOLN
0.0000 [IU] | Freq: Three times a day (TID) | SUBCUTANEOUS | Status: DC
  Administered 2017-04-29: 5 [IU] via SUBCUTANEOUS
  Administered 2017-04-30: 2 [IU] via SUBCUTANEOUS
  Administered 2017-04-30: 5 [IU] via SUBCUTANEOUS
  Administered 2017-05-01: 3 [IU] via SUBCUTANEOUS
  Administered 2017-05-01 (×2): 2 [IU] via SUBCUTANEOUS
  Administered 2017-05-02: 3 [IU] via SUBCUTANEOUS
  Administered 2017-05-02: 2 [IU] via SUBCUTANEOUS
  Administered 2017-05-03: 3 [IU] via SUBCUTANEOUS
  Administered 2017-05-03 – 2017-05-05 (×5): 2 [IU] via SUBCUTANEOUS
  Administered 2017-05-05 – 2017-05-06 (×5): 3 [IU] via SUBCUTANEOUS
  Administered 2017-05-07: 5 [IU] via SUBCUTANEOUS
  Administered 2017-05-07: 3 [IU] via SUBCUTANEOUS
  Administered 2017-05-07: 5 [IU] via SUBCUTANEOUS
  Administered 2017-05-08: 8 [IU] via SUBCUTANEOUS
  Administered 2017-05-08 – 2017-05-09 (×4): 3 [IU] via SUBCUTANEOUS

## 2017-04-29 MED ORDER — DIGOXIN 250 MCG PO TABS
0.2500 mg | ORAL_TABLET | Freq: Every day | ORAL | Status: DC
  Administered 2017-04-30: 0.25 mg via ORAL
  Filled 2017-04-29: qty 1

## 2017-04-29 MED ORDER — SILDENAFIL CITRATE 20 MG PO TABS
40.0000 mg | ORAL_TABLET | Freq: Three times a day (TID) | ORAL | Status: DC
Start: 2017-04-29 — End: 2017-05-09
  Administered 2017-04-29 – 2017-05-09 (×30): 40 mg via ORAL
  Filled 2017-04-29 (×30): qty 2

## 2017-04-29 MED ORDER — INSULIN GLARGINE 100 UNIT/ML ~~LOC~~ SOLN
20.0000 [IU] | Freq: Every day | SUBCUTANEOUS | Status: DC
  Administered 2017-04-29 – 2017-05-08 (×10): 20 [IU] via SUBCUTANEOUS
  Filled 2017-04-29 (×11): qty 0.2

## 2017-04-29 MED ORDER — ZOLPIDEM TARTRATE 5 MG PO TABS
10.0000 mg | ORAL_TABLET | Freq: Every evening | ORAL | Status: DC | PRN
  Administered 2017-04-30 – 2017-05-08 (×9): 10 mg via ORAL
  Filled 2017-04-29 (×9): qty 2

## 2017-04-29 MED ORDER — INSULIN ASPART 100 UNIT/ML ~~LOC~~ SOLN
0.0000 [IU] | Freq: Every day | SUBCUTANEOUS | Status: DC
  Administered 2017-05-06 – 2017-05-08 (×2): 2 [IU] via SUBCUTANEOUS

## 2017-04-29 MED ORDER — PANTOPRAZOLE SODIUM 40 MG IV SOLR
40.0000 mg | Freq: Two times a day (BID) | INTRAVENOUS | Status: DC
  Administered 2017-04-29 – 2017-04-30 (×2): 40 mg via INTRAVENOUS
  Filled 2017-04-29 (×2): qty 40

## 2017-04-29 MED ORDER — SODIUM CHLORIDE 0.9 % IV SOLN: Freq: Once | INTRAVENOUS | Status: DC

## 2017-04-29 MED ORDER — AMIODARONE HCL 200 MG PO TABS
200.0000 mg | ORAL_TABLET | Freq: Every day | ORAL | Status: DC
  Administered 2017-04-30 – 2017-05-09 (×10): 200 mg via ORAL
  Filled 2017-04-29 (×10): qty 1

## 2017-04-29 MED ORDER — CITALOPRAM HYDROBROMIDE 20 MG PO TABS
20.0000 mg | ORAL_TABLET | Freq: Every day | ORAL | Status: DC
Start: 2017-04-30 — End: 2017-05-09
  Administered 2017-04-30 – 2017-05-09 (×10): 20 mg via ORAL
  Filled 2017-04-29 (×10): qty 1

## 2017-04-29 MED ORDER — GABAPENTIN 100 MG PO CAPS
200.0000 mg | ORAL_CAPSULE | Freq: Two times a day (BID) | ORAL | Status: DC
  Administered 2017-04-30 – 2017-05-09 (×19): 200 mg via ORAL
  Filled 2017-04-29 (×18): qty 2

## 2017-04-29 MED ORDER — TRAMADOL HCL 50 MG PO TABS
50.0000 mg | ORAL_TABLET | Freq: Three times a day (TID) | ORAL | Status: DC | PRN
  Administered 2017-05-03 – 2017-05-08 (×5): 50 mg via ORAL
  Filled 2017-04-29 (×5): qty 1

## 2017-04-29 MED ORDER — MAGNESIUM OXIDE 400 (241.3 MG) MG PO TABS
400.0000 mg | ORAL_TABLET | Freq: Every day | ORAL | Status: DC
  Administered 2017-04-30 – 2017-05-09 (×10): 400 mg via ORAL
  Filled 2017-04-29 (×10): qty 1

## 2017-04-29 MED ORDER — DOBUTAMINE IN D5W 4-5 MG/ML-% IV SOLN
7.5000 ug/kg/min | INTRAVENOUS | Status: DC
  Administered 2017-04-29 – 2017-05-07 (×6): 6 ug/kg/min via INTRAVENOUS
  Administered 2017-05-08: 7.5 ug/kg/min via INTRAVENOUS
  Filled 2017-04-29 (×8): qty 250

## 2017-04-29 MED ORDER — LOSARTAN POTASSIUM 25 MG PO TABS
25.0000 mg | ORAL_TABLET | Freq: Every day | ORAL | Status: DC
  Administered 2017-04-30 – 2017-05-09 (×10): 25 mg via ORAL
  Filled 2017-04-29 (×10): qty 1

## 2017-04-29 MED ORDER — ACETAMINOPHEN 325 MG PO TABS
650.0000 mg | ORAL_TABLET | ORAL | Status: DC | PRN
  Administered 2017-05-03: 650 mg via ORAL
  Filled 2017-04-29: qty 2

## 2017-04-29 MED ORDER — FUROSEMIDE 10 MG/ML IJ SOLN
80.0000 mg | Freq: Two times a day (BID) | INTRAMUSCULAR | Status: DC
  Administered 2017-04-30 – 2017-05-06 (×14): 80 mg via INTRAVENOUS
  Filled 2017-04-29 (×9): qty 8

## 2017-04-29 NOTE — H&P (Signed)
Advanced Heart Failure VAD History and Physical Note   Primary Care: [facility] Primary Cardiologist: Dr Haroldine Laws.  [place]  Reason for Admission: GI Bleed  HPI:    Ronald Miller.is a 61 y.o.male with history of chronic systolic HF s/p Medtronic ICD 2014, CAD s/p CABG in 2000, HTN, Hx of cocaine abuse, Tobacco abuse, Depression, PAF, and COPD. HM3 placed 09/09/2016.   Post operative course has been complicated by RV failure requiring dobutamine. Most recently had admission with altered mental status and sent to [facility] on discharge. Discharge weight 210 lbs.  Pt presented today for regular follow up. INR elevated last week. Pt states he has been having black stools since last week, no bright red blood. Denies nose bleeds. No N/V or diarrhea, has been more constipated. More SOB, feels like fluid is rising. Denies lightheadedness or dizziness. Overall just feels worse. [facility] planning on discharge tomorrow to home. At last visit volume elevated so torsemide increased. He is up 2 more lbs from that visit.   LVAD INTERROGATION:  HeartMate 3 LVAD:  Flow 5.5 liters/min, speed 5900, power 4.7, PI 3.4.  10-15 PI events daily  Review of Systems: [y] = yes, [ ]  = no   General: Weight gain [y]; Weight loss [ ] ; Anorexia [ ] ; Fatigue [y]; Fever [ ] ; Chills [ ] ; Weakness [y]  Cardiac: Chest pain/pressure [ ] ; Resting SOB [ ] ; Exertional SOB [y]; Orthopnea [y]; Pedal Edema [ ] ; Palpitations [ ] ; Syncope [ ] ; Presyncope [ ] ; Paroxysmal nocturnal dyspnea[ ]   Pulmonary: Cough [ ] ; Wheezing[ ] ; Hemoptysis[ ] ; Sputum [ ] ; Snoring [ ]   GI: Vomiting[ ] ; Dysphagia[ ] ; Melena[y]; Hematochezia [ ] ; Heartburn[ ] ; Abdominal pain [ ] ; Constipation [y]; Diarrhea [ ] ; BRBPR [ ]   GU: Hematuria[ ] ; Dysuria [ ] ; Nocturia[ ]   Vascular: Pain in legs with walking [ ] ; Pain in feet with lying flat [ ] ; Non-healing sores [ ] ; Stroke [ ] ; TIA [ ] ; Slurred speech [ ] ;    Neuro: Headaches[ ] ; Vertigo[ ] ; Seizures[ ] ; Paresthesias[ ] ;Blurred vision [ ] ; Diplopia [ ] ; Vision changes [ ]   Ortho/Skin: Arthritis [y]; Joint pain [y]; Muscle pain [ ] ; Joint swelling [ ] ; Back Pain [ ] ; Rash [ ]   Psych: Depression[y]; Anxiety[y]  Heme: Bleeding problems [ ] ; Clotting disorders [ ] ; Anemia [ ]   Endocrine: Diabetes [ ] ; Thyroid dysfunction[ ]     Home Medications Prior to Admission medications   Medication Sig Start Date End Date Taking? Authorizing Provider  amiodarone (PACERONE) 200 MG tablet Take 1 tablet (200 mg total) by mouth daily. 01/12/17   Bensimhon, Shaune Pascal, MD  aspirin EC 81 MG EC tablet Take 1 tablet (81 mg total) by mouth daily. 12/07/16   Clegg, Amy D, NP  citalopram (CELEXA) 20 MG tablet Take 1 tablet (20 mg total) by mouth daily. 04/16/17   Clegg, Amy D, NP  clonazepam (KLONOPIN) 0.25 MG disintegrating tablet Take 1 tablet (0.25 mg total) by mouth 2 (two) times daily. 04/15/17   Clegg, Amy D, NP  digoxin (LANOXIN) 0.25 MG tablet Take 0.25 mg by mouth daily.  02/10/17   [provider]  DOBUTamine (DOBUTREX) 4-5 MG/ML-% infusion Inject 571.8 mcg/min into the vein continuous. PER [facility] pharmacy 04/15/17   Darrick Grinder D, NP  docusate sodium (COLACE) 100 MG capsule Take 200 mg by mouth 2 (two) times daily.    [provider]  gabapentin (NEURONTIN) 100 MG capsule  Take 2 capsules (200 mg total) by mouth 2 (two) times daily. 01/12/17   Bensimhon, Shaune Pascal, MD  insulin aspart (NOVOLOG FLEXPEN) 100 UNIT/ML FlexPen Inject 10 Units into the skin 3 (three) times daily with meals. 12/16/16   Clegg, Amy D, NP  Insulin Glargine (LANTUS SOLOSTAR) 100 UNIT/ML Solostar Pen Inject 20 Units into the skin daily at 10 pm.    [provider]  losartan (COZAAR) 25 MG tablet Take 1 tablet (25 mg total) by mouth daily. 04/22/17 07/21/17  Clegg, Amy D, NP  magnesium oxide (MAG-OX) 400 (241.3 Mg) MG tablet Take 1 tablet (400 mg total) by mouth daily. 12/07/16   Clegg,  Amy D, NP  Multiple Vitamin (MULTIVITAMIN WITH MINERALS) TABS tablet Take 1 tablet by mouth daily.    [provider]  ondansetron (ZOFRAN ODT) 4 MG disintegrating tablet Take 1 tablet (4 mg total) by mouth every 8 (eight) hours as needed for nausea. 04/10/17   Tanna Furry, MD  pantoprazole (PROTONIX) 40 MG tablet Take 1 tablet (40 mg total) by mouth daily. 09/25/16   Isaiah Serge, NP  Potassium Chloride ER 20 MEQ TBCR Take 40 mEq by mouth 3 (three) times daily. Take extra 20 meq on Monday and Friday. 04/22/17   Clegg, Amy D, NP  senna-docusate (SENOKOT S) 8.6-50 MG tablet Take 2 tablets by mouth daily.    [provider]  sildenafil (REVATIO) 20 MG tablet Take 2 tablets (40 mg total) by mouth 3 (three) times daily. 02/11/17   Bensimhon, Shaune Pascal, MD  spironolactone (ALDACTONE) 25 MG tablet TAKE 1 TABLET ONE TIME DAILY Patient taking differently: Take 25 mg by mouth daily 02/10/17   Bensimhon, Shaune Pascal, MD  torsemide (DEMADEX) 20 MG tablet Take 5 tablets (100 mg total) by mouth 2 (two) times daily. 04/22/17   Clegg, Amy D, NP  traMADol (ULTRAM) 50 MG tablet Take 1 tablet (50 mg total) by mouth 3 (three) times daily as needed for severe pain. 04/15/17   Clegg, Amy D, NP  warfarin (COUMADIN) 5 MG tablet Take 10-15 mg by mouth See admin instructions. Take 3 tablets (15 mg) daily except 2 tablets (10 mg) on Tuesday.     [provider]  zolpidem (AMBIEN) 10 MG tablet Take 10 mg by mouth at bedtime as needed for sleep.    [provider]    Past Medical History: Past Medical History:  Diagnosis Date  . AICD (automatic cardioverter/defibrillator) present   . ASCVD (arteriosclerotic cardiovascular disease)   . Chronic systolic CHF (congestive heart failure) ([place])   . COPD (chronic obstructive pulmonary disease) ([place])   . Coronary artery disease   . Depression   . Diabetes mellitus   . History of cocaine abuse   . Hypertension   . Presence of permanent cardiac  pacemaker   . Shortness of breath dyspnea   . Suicidal ideation   . Tobacco abuse     Past Surgical History: Past Surgical History:  Procedure Laterality Date  . CARDIAC CATHETERIZATION N/A 02/12/2016   Procedure: Right/Left Heart Cath and Coronary/Graft Angiography;  Surgeon: Jolaine Artist, MD;  Location: [place] [facility];  Service: Cardiovascular;  Laterality: N/A;  . CARDIAC CATHETERIZATION N/A 03/22/2016   Procedure: Right Heart Cath;  Surgeon: Jolaine Artist, MD;  Location: [place] [facility];  Service: Cardiovascular;  Laterality: N/A;  . CARDIAC CATHETERIZATION N/A 08/25/2016   Procedure: Right Heart Cath;  Surgeon: Jolaine Artist, MD;  [facility]  INVASIVE CV LAB;  Service: Cardiovascular;  Laterality: N/A;  . CARDIAC CATHETERIZATION N/A 09/03/2016   Procedure: Right Heart Cath;  Surgeon: Jolaine Artist, MD;  Location: [place] [facility];  Service: Cardiovascular;  Laterality: N/A;  . CARDIAC DEFIBRILLATOR PLACEMENT    . CARDIAC DEFIBRILLATOR PLACEMENT    . CHOLECYSTECTOMY N/A 08/30/2015   Procedure: LAPAROSCOPIC CHOLECYSTECTOMY;  Surgeon: Hubbard Robinson, MD;  Location: [name];  Service: General;  Laterality: N/A;  . COLONOSCOPY WITH PROPOFOL N/A 08/18/2016   Procedure: COLONOSCOPY WITH PROPOFOL;  Surgeon: Jerene Bears, MD;  Location: [place];  Service: Gastroenterology;  Laterality: N/A;  . CORONARY ARTERY BYPASS GRAFT  2001  . INSERT / REPLACE / REMOVE PACEMAKER    . INSERTION OF IMPLANTABLE LEFT VENTRICULAR ASSIST DEVICE N/A 09/09/2016   Procedure: INSERTION OF IMPLANTABLE LEFT VENTRICULAR ASSIST DEVICE;  Surgeon: [name], MD;  Location: [place];  Service: Open Heart Surgery;  Laterality: N/A;  HeartMate 3 CIRC ARREST  NITRIC OXIDE  . IR FLUORO GUIDE CV LINE RIGHT  01/28/2017  . IR GENERIC HISTORICAL  08/03/2016   IR FLUORO GUIDE CV LINE RIGHT 08/03/2016 [name], MD [facility]  . IR GENERIC HISTORICAL  12/14/2016   IR FLUORO GUIDE  CV LINE RIGHT 12/14/2016 [name], MD [facility]  . IR US GUIDE VASC ACCESS RIGHT  01/28/2017  . LUMBAR DISC SURGERY  02/1999   Discectomy and fusion  . RIGHT HEART CATH N/A 02/11/2017   Procedure: Right Heart Cath;  Surgeon: Jolaine Artist, MD;  Location: [place] [facility];  Service: Cardiovascular;  Laterality: N/A;  . TEE WITHOUT CARDIOVERSION N/A 09/09/2016   Procedure: TRANSESOPHAGEAL ECHOCARDIOGRAM (TEE);  Surgeon: [name], MD;  Location: [place];  Service: Open Heart Surgery;  Laterality: N/A;  . VASECTOMY     Subsequent reversal    Family History: Family History  Problem Relation Age of Onset  . CAD Father   . Diabetes Father   . Heart failure Father     Social History: Social History   Social History  . Marital status: Divorced    Spouse name: N/A  . Number of children: 1  . Years of education: 79   Occupational History  . Retired   . Truck driver    Social History Main Topics  . Smoking status: Former Smoker    Packs/day: 1.50    Years: 23.00    Types: Cigarettes    Start date: 10/24/1992    Quit date: 07/01/2016  . Smokeless tobacco: Never Used  . Alcohol use No  . Drug use: Yes    Frequency: 0.1 times per week    Types: Cocaine     Comment: 07/01/2016 last use relapse after 8 yrs  . Sexual activity: Not Currently   Other Topics Concern  . Not on file   Social History Narrative   Divorced with one child   No regular exercise    Allergies:  Allergies  Allergen Reactions  . Codeine Nausea And Vomiting and Other (See Comments)    In high doses  . Trazodone And Nefazodone Other (See Comments)    Dizziness with subsequent fall   . Lipitor [Atorvastatin] Nausea Only and Other (See Comments)    Nausea with high doses, tolerates 20mg  dose (08/22/16)  . Tape Other (See Comments)    Paper tape is ok    Objective:    Vital Signs:   Ht: 5'3' Wt 233 Doppler BP 88  HR 61 02 sat: 93%  Mean arterial Pressure 88  Wt Readings from  Last 3 Encounters:  04/29/17 233 lb (105.7 kg)  04/22/17 231 lb (104.8 kg)  04/16/17 208 lb 1.6 oz (94.4 kg)     Physical Exam    General:  Fatigued and pale appearing. No resp difficulty HEENT: Normal Neck: supple. JVP to jaw. Carotids 2+ bilat; no bruits. No lymphadenopathy or thyromegaly appreciated. Cor: Mechanical heart sounds with LVAD hum present. Lungs: Diminished basilar sounds.  Abdomen: Obese, soft, nontender, nondistended. No hepatosplenomegaly. No bruits or masses. Good bowel sounds. Driveline: C/D/I; securement device intact and driveline incorporated Extremities: no cyanosis, clubbing, or rash. Trace to 1+ edema.  Neuro: alert & orientedx3, cranial nerves grossly intact. moves all 4 extremities w/o difficulty. Affect pleasant  Telemetry   Not yet connected  EKG   N/A  Labs    Basic Metabolic Panel:  Recent Labs Lab 04/29/17 1147  NA 128*  K 5.2*  CL 92*  CO2 27  GLUCOSE 205*  BUN 28*  CREATININE 1.44*  CALCIUM 8.7*    Liver Function Tests: No results for input(s): AST, ALT, ALKPHOS, BILITOT, PROT, ALBUMIN in the last 168 hours. No results for input(s): LIPASE, AMYLASE in the last 168 hours. No results for input(s): AMMONIA in the last 168 hours.  CBC:  Recent Labs Lab 04/29/17 1147  WBC 7.1  HGB 7.6*  HCT 24.8*  MCV 76.5*  PLT 212    Cardiac Enzymes: No results for input(s): CKTOTAL, CKMB, CKMBINDEX, TROPONINI in the last 168 hours.  BNP: BNP (last 3 results)  Recent Labs  10/13/16 1207 11/30/16 1158 01/21/17 1230  BNP 321.5* 565.5* 649.9*    ProBNP (last 3 results) No results for input(s): PROBNP in the last 8760 hours.   CBG: No results for input(s): GLUCAP in the last 168 hours.  Coagulation Studies:  Recent Labs  04/29/17 1147  LABPROT 37.6*  INR 3.70    Imaging     No results found.   Patient Profile:   Ronald Miller.is a 61 y.o.male with history of chronic systolic HF s/p Medtronic ICD 2014,  CAD s/p CABG in 2000, HTN, Hx of cocaine abuse, Tobacco abuse, Depression, PAF, and COPD. HM3 placed 09/09/2016.   Presented to clinic 04/29/17 for follow up. Hgb down, INR up. Reports dark black stools and constipation. Admitted for treatment and further evaluation.   Assessment/Plan:    1. Acute on chronic anemia with Melena; IDA - 1 week of black stools with symptoms and drop in Hgb as below.  - Check FOBT.  - INR 3.7. Hgb 7.6, down from 9.2 last week - Will give 2 u PRBCs today.  - Will ask GI to see to consider endoscopy Monday.  - Hold coumadin and ASA. Will need heparin once INR < 1.8. - Check [employer].   2. Acute/Chronic end-stage biventricular systolic HF: LVEF 07% due to ICM -> s/p Echo 08/24/16 LVEF 15%, RV mild dilated, moderately reduced. -->S/P HM3 LVAD 37/10/624.  - Complicated by RV failure. Initially on milrinone but stopped due to low mixed venous saturation. - Remains on dobutamine 6 mcg.  - Continues to struggle with severe RV failure.  - Volume status remains elevated on exam from visit last week, in setting of high dietary sodium.   Will give 80 mg IV lasix with blood and start on IV lasix 80 mg BID. K high, will follow for supp.  - MAPs stable. Continue losartan 25  mg daily.  - No beta blocker with RV failure - Continue digoxin 0.125 mcg daily.  - Warfarin with goal INR 2.0 - 2.5 PTA. Will need to adjust down on discharge, likely to 1.8 - 2.3. - Of note, he has had epistaxis with lovenox, will avoid this in the future.  - Continues to follow with [facility] for transplant w/u. They are in processing of reassessing him now. We had a long discussion regarding the criteria for transplant consideration. Stressed need that if he is going to miss appointments with his counselor he needs to call to cancel and let them know why he cannot attend appointment. Overall, we do not think he is ideal transplant candidate at this point.   3. CAD: No CP. Severe 3v- CAD s/p CABG with  occluded grafts as above except for LIMA.  - No s/s ischemia  4. DMII:  - Followed by PCP.    5. Tobacco abuse:  - No longer smoking.   6. Atrial fibrillation, paroxysmal - Regular on exam. Will check EKG.      7. Left pleural effusion - underwent repeat thoracentesis in Jan. 2018.  - CXR on 7/8 stable. No recurrent effusion.  - Will check CXR on admission with volume overload.   8.  Anxiety/Depression - Continue Celexa 20 mg daily and klonipin. No change.   9. RUE DVT.  - Off coumadin with GI bleed. + SCDs. Follow for resumption.   10. Hyponatremia:  - 128 on admit. Free water restrict.   11. HTN - Stable on current meds. Will not push with need for diuresis and bleeding.   I reviewed the LVAD parameters from today, and compared the results to the patient's prior recorded data.  No programming changes were made.  The LVAD is functioning within specified parameters.  The patient performs LVAD self-test daily.  LVAD interrogation was negative for any significant power changes, alarms or PI events/speed drops.  LVAD equipment check completed and is in good working order.  Back-up equipment present.   LVAD education done on emergency procedures and precautions and reviewed exit site care.  Length of Stay: 0  Annamaria Helling 04/29/2017, 12:40 PM  VAD Team Pager [phone] (7am - 7am) +++VAD ISSUES ONLY+++   Advanced Heart Failure Team Pager [phone] (M-F; [place])  Please contact [facility] for night-coverage after hours (4p -7a ) and weekends on [email or web address] for all non- LVAD Issues  Patient seen with PA, agree with the above note.    He has had 1 week of dark black stools and feels weak/run down.  No prior history of bleeding.  Hemoglobin is down about 2 points.   - Hold warfarin (INR supratherapeutic).  Will need heparin gtt with INR < 1.8.  - Stop ASA.  - IV PPI.  - Will ask GI to see, will likely need EGD/small bowel enteroscopy when INR is lower.   -  Transfuse 2 units PRBCs.    HM3 LVAD parameters stable.  He also has severe RV failure and is on dobutamine. He is volume overloaded on exam.  - Continue home dobutamine.  - With volume overload and need for blood, will give Lasix 80 mg IV bid.   Loralie Champagne 04/29/2017 1:44 PM

## 2017-04-29 NOTE — Consult Note (Addendum)
Referring Provider: Ezequiel Kayser Primary Care Physician:  [facility] [place] Primary Gastroenterologist: Zenovia Jarred, MD  Reason for Consultation:  Anemia , black stool  ASSESSMENT AND PLAN:   60. 61 yo male with black stools and acute on chronic anemia on coumadin. DDx: AVMs, PUD are the most likely (favor former). Normal screening colonoscopy last year -he will need EGD / enteroscopy when INR permits. Coumadin is on hold, INR needs to drift as I don't think it can be reversed with the LVAD.  -Supportive care in the interim. Blood transfusion per admitting team.  -IV PPI -am INR  2. Hyperkalemia -Correction per admitting team  3. Complex cardiac history: PAF on amiodarone, CAD s/p CABG, cardiomyopathy s/p LVAD placement December. On chronic coumadin  4. Acute blood loss anemia.  HPI: Ronald Miller. is a 61 y.o. male temporarily living at [facility] with chronic systolic CHF s/p ICD. He is s/p CABG , has PAF, COPD, and hx of cocaine abuse. He has an LVAD and is on chronic coumadin. Patient admitted this afternoon with black stools and anemia. Baseline hgb mid 9 to mid 10 range, down to 7.6 today. Stools black since last week. He has actually been constipated so not many BMs overall. Stools were hard but now actually getting soft. He doesn't usually have problems with constipation. No abdominal pain, some mild nausea and what sounds like one episode of regurgitation of red liquid (he thinks blood) with chunks of food. He takes a daily baby asa, no other NSAIDs. No hx of PUD. He takes daily PPI. No chest pain. He has been dizzy.  No SOB.   November 2017 screening colonoscopy. Exam complete with good prep. Findings. A few scattered tics, internal hemorrhoids  Past Medical History:  Diagnosis Date  . AICD (automatic cardioverter/defibrillator) present   . ASCVD (arteriosclerotic cardiovascular disease)   . Chronic systolic CHF (congestive heart failure) ([place])   . COPD  (chronic obstructive pulmonary disease) ([place])   . Coronary artery disease   . Depression   . Diabetes mellitus   . History of cocaine abuse   . Hypertension   . Presence of permanent cardiac pacemaker   . Shortness of breath dyspnea   . Suicidal ideation   . Tobacco abuse     Past Surgical History:  Procedure Laterality Date  . CARDIAC CATHETERIZATION N/A 02/12/2016   Procedure: Right/Left Heart Cath and Coronary/Graft Angiography;  Surgeon: Jolaine Artist, MD;  Location: [facility];  Service: Cardiovascular;  Laterality: N/A;  . CARDIAC CATHETERIZATION N/A 03/22/2016   Procedure: Right Heart Cath;  Surgeon: Jolaine Artist, MD;  Location: [facility];  Service: Cardiovascular;  Laterality: N/A;  . CARDIAC CATHETERIZATION N/A 08/25/2016   Procedure: Right Heart Cath;  Surgeon: Jolaine Artist, MD;  Location: [facility];  Service: Cardiovascular;  Laterality: N/A;  . CARDIAC CATHETERIZATION N/A 09/03/2016   Procedure: Right Heart Cath;  Surgeon: Jolaine Artist, MD;  Location: [facility];  Service: Cardiovascular;  Laterality: N/A;  . CARDIAC DEFIBRILLATOR PLACEMENT    . CARDIAC DEFIBRILLATOR PLACEMENT    . CHOLECYSTECTOMY N/A 08/30/2015   Procedure: LAPAROSCOPIC CHOLECYSTECTOMY;  Surgeon: Hubbard Robinson, MD;  Location: [name];  Service: General;  Laterality: N/A;  . COLONOSCOPY WITH PROPOFOL N/A 08/18/2016   Procedure: COLONOSCOPY WITH PROPOFOL;  Surgeon: Jerene Bears, MD;  Location: [facility];  Service: Gastroenterology;  Laterality: N/A;  . CORONARY ARTERY BYPASS GRAFT  2001  . INSERT / REPLACE / REMOVE PACEMAKER    . INSERTION OF IMPLANTABLE LEFT VENTRICULAR ASSIST DEVICE N/A 09/09/2016   Procedure: INSERTION OF IMPLANTABLE LEFT VENTRICULAR ASSIST DEVICE;  Surgeon: [name], MD;  Location: [facility];  Service: Open Heart Surgery;  Laterality: N/A;  HeartMate 3 CIRC ARREST  NITRIC OXIDE  . IR FLUORO GUIDE CV LINE RIGHT   01/28/2017  . IR GENERIC HISTORICAL  08/03/2016   IR FLUORO GUIDE CV LINE RIGHT 08/03/2016 [name], MD [facility]  . IR GENERIC HISTORICAL  12/14/2016   IR FLUORO GUIDE CV LINE RIGHT 12/14/2016 [name], MD [facility]  . IR US GUIDE VASC ACCESS RIGHT  01/28/2017  . LUMBAR DISC SURGERY  02/1999   Discectomy and fusion  . RIGHT HEART CATH N/A 02/11/2017   Procedure: Right Heart Cath;  Surgeon: Jolaine Artist, MD;  Location: [facility];  Service: Cardiovascular;  Laterality: N/A;  . TEE WITHOUT CARDIOVERSION N/A 09/09/2016   Procedure: TRANSESOPHAGEAL ECHOCARDIOGRAM (TEE);  Surgeon: [name], MD;  Location: [facility];  Service: Open Heart Surgery;  Laterality: N/A;  . VASECTOMY     Subsequent reversal    Prior to Admission medications   Medication Sig Start Date End Date Taking? Authorizing Provider  amiodarone (PACERONE) 200 MG tablet Take 1 tablet (200 mg total) by mouth daily. 01/12/17   Bensimhon, Shaune Pascal, MD  aspirin EC 81 MG EC tablet Take 1 tablet (81 mg total) by mouth daily. 12/07/16   Clegg, Amy D, NP  citalopram (CELEXA) 20 MG tablet Take 1 tablet (20 mg total) by mouth daily. 04/16/17   Clegg, Amy D, NP  clonazepam (KLONOPIN) 0.25 MG disintegrating tablet Take 1 tablet (0.25 mg total) by mouth 2 (two) times daily. 04/15/17   Clegg, Amy D, NP  digoxin (LANOXIN) 0.25 MG tablet Take 0.25 mg by mouth daily.  02/10/17   [provider]  DOBUTamine (DOBUTREX) 4-5 MG/ML-% infusion Inject 571.8 mcg/min into the vein continuous. PER [facility] pharmacy 04/15/17   Darrick Grinder D, NP  docusate sodium (COLACE) 100 MG capsule Take 200 mg by mouth 2 (two) times daily.    [provider]  gabapentin (NEURONTIN) 100 MG capsule Take 2 capsules (200 mg total) by mouth 2 (two) times daily. 01/12/17   Bensimhon, Shaune Pascal, MD  insulin aspart (NOVOLOG FLEXPEN) 100 UNIT/ML FlexPen Inject 10 Units into the skin 3 (three) times daily with meals. 12/16/16   Clegg, Amy D, NP    Insulin Glargine (LANTUS SOLOSTAR) 100 UNIT/ML Solostar Pen Inject 20 Units into the skin daily at 10 pm.    [provider]  losartan (COZAAR) 25 MG tablet Take 1 tablet (25 mg total) by mouth daily. 04/22/17 07/21/17  Clegg, Amy D, NP  magnesium oxide (MAG-OX) 400 (241.3 Mg) MG tablet Take 1 tablet (400 mg total) by mouth daily. 12/07/16   Clegg, Amy D, NP  Multiple Vitamin (MULTIVITAMIN WITH MINERALS) TABS tablet Take 1 tablet by mouth daily.    [provider]  ondansetron (ZOFRAN ODT) 4 MG disintegrating tablet Take 1 tablet (4 mg total) by mouth every 8 (eight) hours as needed for nausea. 04/10/17   Tanna Furry, MD  pantoprazole (PROTONIX) 40 MG tablet Take 1 tablet (40 mg total) by mouth daily. 09/25/16   Isaiah Serge, NP  Potassium Chloride ER 20 MEQ TBCR Take 40 mEq by mouth 3 (three) times daily. Take extra 20 meq on Monday and  Friday. 04/22/17   Clegg, Amy D, NP  senna-docusate (SENOKOT S) 8.6-50 MG tablet Take 2 tablets by mouth daily.    [provider]  sildenafil (REVATIO) 20 MG tablet Take 2 tablets (40 mg total) by mouth 3 (three) times daily. 02/11/17   Bensimhon, Shaune Pascal, MD  spironolactone (ALDACTONE) 25 MG tablet TAKE 1 TABLET ONE TIME DAILY Patient taking differently: Take 25 mg by mouth daily 02/10/17   Bensimhon, Shaune Pascal, MD  torsemide (DEMADEX) 20 MG tablet Take 5 tablets (100 mg total) by mouth 2 (two) times daily. 04/22/17   Clegg, Amy D, NP  traMADol (ULTRAM) 50 MG tablet Take 1 tablet (50 mg total) by mouth 3 (three) times daily as needed for severe pain. 04/15/17   Clegg, Amy D, NP  warfarin (COUMADIN) 5 MG tablet Take 10-15 mg by mouth See admin instructions. Take 3 tablets (15 mg) daily except 2 tablets (10 mg) on Tuesday.     [provider]  zolpidem (AMBIEN) 10 MG tablet Take 10 mg by mouth at bedtime as needed for sleep.    [provider]    Current Facility-Administered Medications  Medication Dose Route Frequency  Provider Last Rate Last Dose  . acetaminophen (TYLENOL) tablet 650 mg  650 mg Oral Q4H PRN [name] Friar, PA-C      . amiodarone (PACERONE) tablet 200 mg  200 mg Oral Daily [name] Friar, PA-C      . citalopram (CELEXA) tablet 20 mg  20 mg Oral Daily [name] Friar, PA-C      . clonazepam Bobbye Charleston) disintegrating tablet 0.25 mg  0.25 mg Oral BID [name] Friar, PA-C      . digoxin (LANOXIN) tablet 0.25 mg  0.25 mg Oral Daily [name] Friar, PA-C      . DOBUTamine (DOBUTREX) infusion 4000 mcg/mL  6 mcg/kg/min Intravenous Continuous [name] Friar, PA-C      . docusate sodium (COLACE) capsule 200 mg  200 mg Oral BID [name] Friar, PA-C      . furosemide (LASIX) injection 80 mg  80 mg Intravenous Once [name] Friar, PA-C      . [START ON 04/30/2017] furosemide (LASIX) injection 80 mg  80 mg Intravenous BID [name] Friar, PA-C      . gabapentin (NEURONTIN) capsule 200 mg  200 mg Oral BID [name] Friar, PA-C      . insulin aspart (novoLOG) injection 0-15 Units  0-15 Units Subcutaneous TID WC [name] Friar, PA-C      . insulin aspart (novoLOG) injection 0-5 Units  0-5 Units Subcutaneous QHS [name] Friar, PA-C      . insulin glargine (LANTUS) injection 20 Units  20 Units Subcutaneous QHS [name] Friar, PA-C      . losartan (COZAAR) tablet 25 mg  25 mg Oral Daily [name] Friar, PA-C      . magnesium oxide (MAG-OX) tablet 400 mg  400 mg Oral Daily [name] Friar, PA-C      . ondansetron [facility]) injection 4 mg  4 mg Intravenous Q6H PRN [name] Friar, PA-C      . ondansetron (ZOFRAN-ODT) disintegrating tablet 4 mg  4 mg Oral Q8H PRN [name] Friar, PA-C      . pantoprazole (PROTONIX) injection 40 mg  40 mg Intravenous Q12H [name] Friar, PA-C      . senna-docusate (Senokot-S) tablet 2 tablet  2 tablet Oral Daily [name] Friar, PA-C      .  sildenafil (REVATIO) tablet 40 mg  40 mg Oral TID [name] Friar, PA-C      . traMADol Veatrice Bourbon) tablet 50 mg  50 mg Oral TID PRN [name] Friar, PA-C      . zolpidem (AMBIEN) tablet 10 mg  10 mg Oral QHS PRN [name] Friar, PA-C        Allergies as of 04/29/2017 - Review Complete 04/29/2017  Allergen Reaction Noted  . Codeine Nausea And Vomiting and Other (See Comments) 02/04/2015  . Trazodone and nefazodone Other (See Comments) 12/29/2016  . Lipitor [atorvastatin] Nausea Only and Other (See Comments) 06/20/2016  . Tape Other (See Comments) 12/15/2016    Family History  Problem Relation Age of Onset  . CAD Father   . Diabetes Father   . Heart failure Father     Social History   Social History  . Marital status: Divorced    Spouse name: N/A  . Number of children: 1  . Years of education: 75   Occupational History  . Retired   . Truck driver    Social History Main Topics  . Smoking status: Former Smoker    Packs/day: 1.50    Years: 23.00    Types: Cigarettes    Start date: 10/24/1992    Quit date: 07/01/2016  . Smokeless tobacco: Never Used  . Alcohol use No  . Drug use: Yes    Frequency: 0.1 times per week    Types: Cocaine     Comment: 07/01/2016 last use relapse after 8 yrs  . Sexual activity: Not Currently   Other Topics Concern  . Not on file   Social History Narrative   Divorced with one child   No regular exercise    Review of Systems: All systems reviewed and negative except where noted in HPI.  Physical Exam: Vital signs in last 24 hours: Pulse Rate:  [61] 61 (07/27 1139) Resp:  [16] 16 (07/27 1139) BP: (88)/(0) 88/0 (07/27 1139) SpO2:  [93 %] 93 % (07/27 1139) Weight:  [233 lb (105.7 kg)] 233 lb (105.7 kg) (07/27 1139)   General:   Alert, overweight white male in NAD Psych:  Pleasant, cooperative. Normal mood and affect. Eyes:  Pupils equal, sclera clear, no icterus.   Conjunctiva pink. Ears:   Normal auditory acuity. Nose:  No deformity, discharge,  or lesions. Neck:  Supple; no masses Lungs:  Clear throughout to auscultation.   No wheezes, crackles, or rhonchi.  Heart:  Regular rate and rhythm, LVAD hum, + JVD to angle of jaw,  1+ BLE edema with venous stasis changes Abdomen:  Soft, non-distended, nontender, BS active, no palp mass , though limited by body habitus Rectal:  Deferred  Ext: chronic venous stasis, R> L,  Msk:  Symmetrical without gross deformities. . Neurologic:  Alert and  oriented x4;  grossly normal neurologically. Skin:  Intact without significant lesions or rashes..   Intake/Output from previous day: No intake/output data recorded. Intake/Output this shift: No intake/output data recorded.  Lab Results:  Recent Labs  04/29/17 1147  WBC 7.1  HGB 7.6*  HCT 24.8*  PLT 212   BMET  Recent Labs  04/29/17 1147  NA 128*  K 5.2*  CL 92*  CO2 27  GLUCOSE 205*  BUN 28*  CREATININE 1.44*  CALCIUM 8.7*   LFT No results for input(s): PROT, ALBUMIN, AST, ALT, ALKPHOS, BILITOT, BILIDIR, IBILI in the last 72 hours. PT/INR  Recent Labs  04/29/17 1147  LABPROT 37.6*  INR 3.70     Studies/Results: No results found.   Tye Savoy, NP-C @  04/29/2017, 4:13 PM  Pager number [phone]   I have reviewed the entire case in detail with the above APP,who acted as my scribe as we saw the patient together.  Therefore, I agree with the diagnoses recorded above.  Complex cardiac patient with LVAD and right HF admitted with worsening melena and significant Hgb drop since last week.  Fortunately, he is stable right now, but he is over-therapeutic on coumadin.  INR will likely take 2-3 days to approach 1.5 and allow SB enteroscopy with hopeful therapeutic intervention of culprit lesion can be identified.  May be ulcer from aspirin, but of course AVMs are very common in LVAD patients.  He is agreeable to the enteroscopy after a thorough discussion of  the procedure and risks.  The benefits and risks of the planned procedure were described in detail with the patient or (when appropriate) their health care proxy.  Risks were outlined as including, but not limited to, bleeding, infection, perforation, adverse medication reaction leading to cardiac or pulmonary decompensation, or pancreatitis (if ERCP).  The limitation of incomplete mucosal visualization was also discussed.  No guarantees or warranties were given.  Patient at increased risk for cardiopulmonary complications of procedure due to medical comorbidities.   He will be transfused PRBCs and followed with serial Hgb/Hct.  We will follow over weekend and anticipate probable scope Monday, 7/30   Nelida Meuse III Pager [phone]  Mon-Fri 8a-5p [phone] after 5p, weekends, holidays

## 2017-04-29 NOTE — Progress Notes (Signed)
ANTICOAGULATION CONSULT NOTE - Initial Consult  Pharmacy Consult for Heparin Indication: atrial fibrillation  Allergies  Allergen Reactions  . Codeine Nausea And Vomiting and Other (See Comments)    In high doses  . Trazodone And Nefazodone Other (See Comments)    Dizziness with subsequent fall   . Lipitor [Atorvastatin] Nausea Only and Other (See Comments)    Nausea with high doses, tolerates 20mg  dose (08/22/16)  . Tape Other (See Comments)    Paper tape is ok    Patient Measurements: Heparin Dosing Weight: 81.5 kg  Vital Signs: BP: 89/62 (07/27 1611) Pulse Rate: 61 (07/27 1139)  Labs:  Recent Labs  04/29/17 1147  HGB 7.6*  HCT 24.8*  PLT 212  LABPROT 37.6*  INR 3.70  CREATININE 1.44*    Estimated Creatinine Clearance: 59 mL/min (A) (by C-G formula based on SCr of 1.44 mg/dL (H)).   Medical History: Past Medical History:  Diagnosis Date  . AICD (automatic cardioverter/defibrillator) present   . ASCVD (arteriosclerotic cardiovascular disease)   . Chronic systolic CHF (congestive heart failure) ([place])   . COPD (chronic obstructive pulmonary disease) ([place])   . Coronary artery disease   . Depression   . Diabetes mellitus   . History of cocaine abuse   . Hypertension   . Presence of permanent cardiac pacemaker   . Shortness of breath dyspnea   . Suicidal ideation   . Tobacco abuse     Assessment: 72 yoM with end-stage heart failure (LVAD placement in 09/2016) and PAF on coumadin PTA, now here with acute on chronic anemia complicated by Melena. Pharmacy consulted to start Heparin when INR<1.8. INR currently 3.7. Hgb dropped from 9.2 to 7.6 in less than a week. Plts wnl. Patient will get [facility].   Goal of Therapy:  Heparin level 0.3-0.7 units/ml INR 1.8-2.3 Monitor platelets by anticoagulation protocol: Yes   Plan:  Monitor INR Heparin to start when INR <1.8 Monitor s/sx of bleeding  Dierdre Harness, PharmD Clinical Pharmacist [phone]  (Pager) 04/29/2017 4:39 PM

## 2017-04-30 ENCOUNTER — Inpatient Hospital Stay (HOSPITAL_COMMUNITY): Payer: Medicare HMO

## 2017-04-30 DIAGNOSIS — D509 Iron deficiency anemia, unspecified: Secondary | ICD-10-CM

## 2017-04-30 DIAGNOSIS — K922 Gastrointestinal hemorrhage, unspecified: Secondary | ICD-10-CM

## 2017-04-30 DIAGNOSIS — E871 Hypo-osmolality and hyponatremia: Secondary | ICD-10-CM

## 2017-04-30 LAB — CBC
HEMATOCRIT: 27.7 % — AB (ref 39.0–52.0)
Hemoglobin: 8.7 g/dL — ABNORMAL LOW (ref 13.0–17.0)
MCH: 24.8 pg — ABNORMAL LOW (ref 26.0–34.0)
MCHC: 31.4 g/dL (ref 30.0–36.0)
MCV: 78.9 fL (ref 78.0–100.0)
PLATELETS: 189 10*3/uL (ref 150–400)
RBC: 3.51 MIL/uL — AB (ref 4.22–5.81)
RDW: 18.9 % — AB (ref 11.5–15.5)
WBC: 6.1 10*3/uL (ref 4.0–10.5)

## 2017-04-30 LAB — LACTATE DEHYDROGENASE: LDH: 191 U/L (ref 98–192)

## 2017-04-30 LAB — GLUCOSE, CAPILLARY
GLUCOSE-CAPILLARY: 157 mg/dL — AB (ref 65–99)
GLUCOSE-CAPILLARY: 231 mg/dL — AB (ref 65–99)
Glucose-Capillary: 141 mg/dL — ABNORMAL HIGH (ref 65–99)
Glucose-Capillary: 143 mg/dL — ABNORMAL HIGH (ref 65–99)

## 2017-04-30 LAB — PROTIME-INR
INR: 3.01
Prothrombin Time: 31.9 seconds — ABNORMAL HIGH (ref 11.4–15.2)

## 2017-04-30 LAB — BASIC METABOLIC PANEL
ANION GAP: 6 (ref 5–15)
BUN: 27 mg/dL — ABNORMAL HIGH (ref 6–20)
CALCIUM: 8.3 mg/dL — AB (ref 8.9–10.3)
CO2: 31 mmol/L (ref 22–32)
CREATININE: 1.37 mg/dL — AB (ref 0.61–1.24)
Chloride: 93 mmol/L — ABNORMAL LOW (ref 101–111)
GFR, EST NON AFRICAN AMERICAN: 55 mL/min — AB (ref >60)
Glucose, Bld: 157 mg/dL — ABNORMAL HIGH (ref 65–99)
Potassium: 3.9 mmol/L (ref 3.5–5.1)
SODIUM: 130 mmol/L — AB (ref 135–145)

## 2017-04-30 LAB — HEMOGLOBIN AND HEMATOCRIT, BLOOD
HCT: 29.1 % — ABNORMAL LOW (ref 39.0–52.0)
Hemoglobin: 8.9 g/dL — ABNORMAL LOW (ref 13.0–17.0)

## 2017-04-30 MED ORDER — OXYMETAZOLINE HCL 0.05 % NA SOLN
1.0000 | Freq: Two times a day (BID) | NASAL | Status: DC | PRN
  Administered 2017-05-01: 1 via NASAL
  Filled 2017-04-30: qty 15

## 2017-04-30 MED ORDER — FUROSEMIDE 10 MG/ML IJ SOLN
INTRAMUSCULAR | Status: AC
  Filled 2017-04-30: qty 8

## 2017-04-30 MED ORDER — PANTOPRAZOLE SODIUM 40 MG PO TBEC
40.0000 mg | DELAYED_RELEASE_TABLET | Freq: Two times a day (BID) | ORAL | Status: DC
  Administered 2017-04-30 – 2017-05-09 (×18): 40 mg via ORAL
  Filled 2017-04-30 (×18): qty 1

## 2017-04-30 MED ORDER — SODIUM CHLORIDE 0.9 % IV SOLN
510.0000 mg | Freq: Once | INTRAVENOUS | Status: AC
  Administered 2017-04-30: 510 mg via INTRAVENOUS
  Filled 2017-04-30: qty 17

## 2017-04-30 NOTE — Progress Notes (Addendum)
[facility] Progress Note  Chief Complaint: Melena  Subjective  History There were no events noted overnight for Mr. Sult. He does not believe he has had a bowel movement or passed any melena since arrival yesterday. He is eating a regular diet without difficulty today.   ROS: Cardiovascular:  no chest pain Respiratory: no dyspnea  Objective:  Med list reviewed  Vital signs in last 24 hrs: Vitals:   04/30/17 0738 04/30/17 0800  BP: (!) 107/95 102/82  Pulse:    Resp: 17   Temp: 97.9 F (36.6 C)     Physical Exam   HEENT: sclera anicteric, oral mucosa moist without lesions  Neck: supple, no thyromegaly, + JVD   Cardiac: LVAD hum, + peripheral edema and venous stasis changes as before  Pulm: clear to auscultation bilaterally, somewhat obscured by LVAD noise, normal RR and effort noted  Abdomen: soft, obese, no tenderness, with active bowel sounds. No guarding or palpable hepatosplenomegaly  Skin; warm and dry, no jaundice or rash  Recent Labs:   Recent Labs Lab 04/29/17 1147 04/30/17 0415 04/30/17 0605  WBC 7.1 6.1  --   HGB 7.6* 8.7* 8.9*  HCT 24.8* 27.7* 29.1*  PLT 212 189  --     Recent Labs Lab 04/30/17 0415  NA 130*  K 3.9  CL 93*  CO2 31  BUN 27*  GLUCOSE 157*    Recent Labs Lab 04/30/17 0415  INR 3.01     @ASSESSMENTPLANBEGIN @ Assessment: Melena Acute blood loss anemia Chronic anticoagulation with Coumadin LVAD  His hemoglobin did not rise appropriately with 2 units of PRBCs. It is possible he has continued to have low-grade for GI or small bowel bleeding since his INR is still over the therapeutic range. He is certainly stable, and the current plan is to perform a small bowel enteroscopy on Monday if his INR is approaching 1.5. He remains agreeable to this plan. We will see him tomorrow and make the arrangements.  Time 25 minutes Nelida Meuse III Pager [phone] Mon-Fri 8a-5p [phone] after 5p, weekends, holidays

## 2017-04-30 NOTE — Progress Notes (Signed)
ANTICOAGULATION CONSULT NOTE - Follow Up Consult  Pharmacy Consult for Heparin Indication: atrial fibrillation  Allergies  Allergen Reactions  . Codeine Nausea And Vomiting and Other (See Comments)    In high doses  . Trazodone And Nefazodone Other (See Comments)    Dizziness with subsequent fall   . Lipitor [Atorvastatin] Nausea Only and Other (See Comments)    Nausea with high doses, tolerates 20mg  dose (08/22/16)  . Tape Other (See Comments)    Paper tape is ok    Patient Measurements: Heparin Dosing Weight: 81.5 kg  Vital Signs: Temp: 97.9 F (36.6 C) (07/28 0738) Temp Source: Oral (07/28 0738) BP: 102/82 (07/28 0800) Pulse Rate: 73 (07/28 0417)  Labs:  Recent Labs  04/29/17 1147 04/30/17 0415 04/30/17 0605  HGB 7.6* 8.7* 8.9*  HCT 24.8* 27.7* 29.1*  PLT 212 189  --   LABPROT 37.6* 31.9*  --   INR 3.70 3.01  --   CREATININE 1.44* 1.37*  --     Estimated Creatinine Clearance: 61 mL/min (A) (by C-G formula based on SCr of 1.37 mg/dL (H)).   Medical History: Past Medical History:  Diagnosis Date  . AICD (automatic cardioverter/defibrillator) present   . ASCVD (arteriosclerotic cardiovascular disease)   . Chronic systolic CHF (congestive heart failure) ([place])   . COPD (chronic obstructive pulmonary disease) ([place])   . Coronary artery disease   . Depression   . Diabetes mellitus   . History of cocaine abuse   . Hypertension   . Presence of permanent cardiac pacemaker   . Shortness of breath dyspnea   . Suicidal ideation   . Tobacco abuse     Assessment: 49 yoM with end-stage heart failure (LVAD placement in 09/2016) on outpatient Dobutamione drip 30mcg/kg/min for RV failure with dig and sildenafil.  MAPs stable 70s  PAF on amio now SR  On coumadin PTA 15mg  TTS/10mg  MWFSu admit INR 3.7 - being admitted woth HGB drop and melena> s/p 2 uts PRBC.  Hold warfarin for GI work up. Pharmacy consulted to start Heparin when INR<1.8  INR 3.0 today  Goal of  Therapy:  Heparin level 0.3-0.7 units/ml INR 1.8-2.3 Monitor platelets by anticoagulation protocol: Yes   Plan:  Hold warfarin Follow INR Start heparin drip when INR < 1.8  Bonnita Nasuti Pharm.D. CPP, BCPS Clinical Pharmacist [phone] 04/30/2017 10:10 AM

## 2017-04-30 NOTE — Progress Notes (Signed)
MD notified about nose bleed pressure was applied and waiting on Afrin nose spray. I will continue to monitor.

## 2017-04-30 NOTE — Progress Notes (Signed)
Advanced Heart Failure VAD Team Note  Subjective:    He has received 2u PRBCs. Hgb 7.6-> 8.9. No BM overnight. Seen by GI. Pending enteroscopy on Monday if INR down .  Feels fatigued but otherwise ok. Dyspneic with activity. No orthopnea or PND.   LDH 191. INR 3.01  LVAD INTERROGATION:  HeartMate II LVAD:  Flow 5.6 liters/min, speed 5900, power 4.6, PI 3.4  Multiple PI events  Objective:    Vital Signs:   Temp:  [97.9 F (36.6 C)-98.7 F (37.1 C)] 97.9 F (36.6 C) (07/28 0738) Pulse Rate:  [60-82] 73 (07/28 0417) Resp:  [14-22] 17 (07/28 0738) BP: (88-107)/(0-95) 102/82 (07/28 0800) SpO2:  [93 %-98 %] 93 % (07/28 0738) Weight:  [102.6 kg (226 lb 3.1 oz)-105.7 kg (233 lb)] 102.6 kg (226 lb 3.1 oz) (07/28 0738) Last BM Date: 04/29/17 Mean arterial Pressure 80-90s  Intake/Output:   Intake/Output Summary (Last 24 hours) at 04/30/17 1128 Last data filed at 04/30/17 1000  Gross per 24 hour  Intake          2777.58 ml  Output             3375 ml  Net          -597.42 ml     Physical Exam    CVP 23 General:  Lying flat in bed. NAD.  HEENT: normal  Neck: supple. JVP to ear.  Carotids 2+ bilat; no bruits. No lymphadenopathy or thryomegaly appreciated. Cor: LVAD hum.  Lungs: Clear  Abdomen: obese soft, nontender, non-distended. No hepatosplenomegaly. No bruits or masses. Good bowel sounds. Driveline site clean. Anchor in place.  Extremities: no cyanosis, clubbing, rash. Warm. Tr-1+ edema  Neuro: alert & oriented x 3. No focal deficits. Moves all 4 without problem   Telemetry   NSR 70s Personally reviewed   EKG    N/A  Labs   Basic Metabolic Panel:  Recent Labs Lab 04/29/17 1147 04/30/17 0415  NA 128* 130*  K 5.2* 3.9  CL 92* 93*  CO2 27 31  GLUCOSE 205* 157*  BUN 28* 27*  CREATININE 1.44* 1.37*  CALCIUM 8.7* 8.3*    Liver Function Tests: No results for input(s): AST, ALT, ALKPHOS, BILITOT, PROT, ALBUMIN in the last 168 hours. No results for  input(s): LIPASE, AMYLASE in the last 168 hours. No results for input(s): AMMONIA in the last 168 hours.  CBC:  Recent Labs Lab 04/29/17 1147 04/30/17 0415 04/30/17 0605  WBC 7.1 6.1  --   HGB 7.6* 8.7* 8.9*  HCT 24.8* 27.7* 29.1*  MCV 76.5* 78.9  --   PLT 212 189  --     INR:  Recent Labs Lab 04/29/17 1147 04/30/17 0415  INR 3.70 3.01    Other results:     Imaging   Dg Chest Port 1 View  Result Date: 04/30/2017 CLINICAL DATA:  Heart failure EXAM: PORTABLE CHEST 1 VIEW COMPARISON:  04/10/2017 FINDINGS: LVAD and pacer remain in place, unchanged. Right internal jugular PICC line now crosses the midline with the tip in the left innominate vein. Cardiomegaly. Bibasilar atelectasis. No pneumothorax. IMPRESSION: Right internal jugular PICC line tip now passes into the left innominate vein. Cardiomegaly and bibasilar atelectasis, stable. Electronically Signed   By: Rolm Baptise M.D.   On: 04/30/2017 08:04      Medications:     Scheduled Medications: . amiodarone  200 mg Oral Daily  . citalopram  20 mg Oral Daily  . digoxin  0.25 mg  Oral Daily  . docusate sodium  200 mg Oral BID  . furosemide  80 mg Intravenous BID  . gabapentin  200 mg Oral BID  . insulin aspart  0-15 Units Subcutaneous TID WC  . insulin aspart  0-5 Units Subcutaneous QHS  . insulin glargine  20 Units Subcutaneous QHS  . losartan  25 mg Oral Daily  . magnesium oxide  400 mg Oral Daily  . pantoprazole  40 mg Oral BID AC  . senna-docusate  2 tablet Oral Daily  . sildenafil  40 mg Oral TID     Infusions: . sodium chloride    . DOBUTamine 6 mcg/kg/min (04/30/17 0800)     PRN Medications:  acetaminophen, clonazePAM, ondansetron (ZOFRAN) IV, ondansetron, traMADol, zolpidem   Patient Profile   Ronald Milleris a 61 y.o.male with history of chronic systolic HF s/p Medtronic ICD 2014, CAD s/p CABG in 2000, HTN, Hx of cocaine abuse, Tobacco abuse, Depression, PAF, and COPD. HM3 placed  09/09/2016.   Presented to clinic 04/29/17 for follow up. Hgb down, INR up. Reports dark black stools and constipation. Admitted for treatment and further evaluation.    Assessment/Plan:    1. Acute on chronic anemia with Melena - likely acute upper GI bleed; IDA - 1 week of black stools with symptoms and drop in Hgb. FOBT pending - Now s/p 2u RBCs. Hgb with suboptimal bump. Can transfuse again if hgb < 8.0 - GI has seen and pending enteroscopy on Monday if INR down - Holding warfarin and ASA. INR 3.1 today, Continue to hold. Heparin once INR < 1.8 D/w PharmD.  - Iron low. Will give feraheme - Continue IV protonix  2. Acute/Chronic end-stage biventricular systolic XN:ATFT 73% due to ICM ->s/p Echo 08/24/16 LVEF 15%, RV mild dilated, moderately reduced. -->S/P HM3 LVAD 22/0/2542.  - Complicated by RV failure. Initially on milrinone but stopped due to low mixed venous saturation, Remains on home dobutamine 44mcg.  - Continues to struggle with severe RV failure. - On admit weight 233. Remains on IV lasix. Now 226. Baseline ~210. CVP 23 (checked personally) Will continue IV lasix. Add metolazone as needed.  - MAPs and renal function stable. Continue losartan 25 mg daily.  - No beta blocker with RV failure - Continue digoxin 0.125 mcg daily.   3. CAD: No CP. 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  -No s/s ischemia. Continue statin. Off ASA due to GIB  4. DMII:  - Continue scheduled insulin and SSI  5. Tobacco/substance abuse:  - Currently not using  6. Atrial fibrillation, paroxysmal - Maintaining NSR.   7. Anxiety/Depression - Continue Celexa 20 mg daily and klonipin. No change.   8. RUE DVT.  - Off coumadin with GI bleed.  Follow for resumption.   9. Hyponatremia:  - 128 on admit. Free water restrict. Up to 130 today  10. CKD stage 3 - Stable. Watch closely with diuresis  I reviewed the LVAD parameters from today, and compared  the results to the patient's prior recorded data.  No programming changes were made.  The LVAD is functioning within specified parameters.  The patient performs LVAD self-test daily.  LVAD interrogation was negative for any significant power changes, alarms or PI events/speed drops.  LVAD equipment check completed and is in good working order.  Back-up equipment present.   LVAD education done on emergency procedures and precautions and reviewed exit site care.  Length of Stay: 1  Glori Bickers, MD 04/30/2017, 11:28 AM  VAD Team --- VAD ISSUES ONLY--- Pager [phone] (7am - 7am)  Advanced Heart Failure Team  Pager [phone] (M-F; 7a - 4p)  Please contact [facility] for night-coverage after hours (4p -7a ) and weekends on [email or web address]

## 2017-05-01 DIAGNOSIS — D500 Iron deficiency anemia secondary to blood loss (chronic): Secondary | ICD-10-CM

## 2017-05-01 LAB — BPAM RBC
BLOOD PRODUCT EXPIRATION DATE: 201808282359
Blood Product Expiration Date: 201808282359
ISSUE DATE / TIME: 201807272102
ISSUE DATE / TIME: 201807280028
UNIT TYPE AND RH: 5100
UNIT TYPE AND RH: 5100

## 2017-05-01 LAB — BASIC METABOLIC PANEL
Anion gap: 7 (ref 5–15)
BUN: 24 mg/dL — AB (ref 6–20)
CHLORIDE: 92 mmol/L — AB (ref 101–111)
CO2: 31 mmol/L (ref 22–32)
Calcium: 8.5 mg/dL — ABNORMAL LOW (ref 8.9–10.3)
Creatinine, Ser: 1.23 mg/dL (ref 0.61–1.24)
GFR calc Af Amer: >60 mL/min (ref >60)
GFR calc non Af Amer: >60 mL/min (ref >60)
Glucose, Bld: 167 mg/dL — ABNORMAL HIGH (ref 65–99)
POTASSIUM: 3.8 mmol/L (ref 3.5–5.1)
SODIUM: 130 mmol/L — AB (ref 135–145)

## 2017-05-01 LAB — TYPE AND SCREEN
ABO/RH(D): O POS
Antibody Screen: NEGATIVE
UNIT DIVISION: 0
UNIT DIVISION: 0

## 2017-05-01 LAB — CBC
HCT: 26.9 % — ABNORMAL LOW (ref 39.0–52.0)
HEMOGLOBIN: 8.2 g/dL — AB (ref 13.0–17.0)
MCH: 24.2 pg — AB (ref 26.0–34.0)
MCHC: 30.5 g/dL (ref 30.0–36.0)
MCV: 79.4 fL (ref 78.0–100.0)
Platelets: 167 10*3/uL (ref 150–400)
RBC: 3.39 MIL/uL — AB (ref 4.22–5.81)
RDW: 18.8 % — ABNORMAL HIGH (ref 11.5–15.5)
WBC: 6.1 10*3/uL (ref 4.0–10.5)

## 2017-05-01 LAB — PROTIME-INR
INR: 2.35
PROTHROMBIN TIME: 26.1 s — AB (ref 11.4–15.2)

## 2017-05-01 LAB — COOXEMETRY PANEL
Carboxyhemoglobin: 1.7 % — ABNORMAL HIGH (ref 0.5–1.5)
Methemoglobin: 1.6 % — ABNORMAL HIGH (ref 0.0–1.5)
O2 Saturation: 52.9 %
TOTAL HEMOGLOBIN: 8.7 g/dL — AB (ref 12.0–16.0)

## 2017-05-01 LAB — GLUCOSE, CAPILLARY
GLUCOSE-CAPILLARY: 162 mg/dL — AB (ref 65–99)
Glucose-Capillary: 141 mg/dL — ABNORMAL HIGH (ref 65–99)
Glucose-Capillary: 171 mg/dL — ABNORMAL HIGH (ref 65–99)
Glucose-Capillary: 141 mg/dL — ABNORMAL HIGH (ref 65–99)

## 2017-05-01 LAB — DIGOXIN LEVEL: DIGOXIN LVL: 1.1 ng/mL (ref 0.8–2.0)

## 2017-05-01 LAB — LACTATE DEHYDROGENASE: LDH: 152 U/L (ref 98–192)

## 2017-05-01 MED ORDER — FUROSEMIDE 10 MG/ML IJ SOLN
INTRAMUSCULAR | Status: AC
  Administered 2017-05-01: 80 mg via INTRAVENOUS
  Filled 2017-05-01: qty 8

## 2017-05-01 MED ORDER — POTASSIUM CHLORIDE CRYS ER 10 MEQ PO TBCR
40.0000 meq | EXTENDED_RELEASE_TABLET | Freq: Every day | ORAL | Status: DC
  Administered 2017-05-01: 40 meq via ORAL
  Filled 2017-05-01 (×3): qty 4

## 2017-05-01 MED ORDER — DIGOXIN 125 MCG PO TABS
0.1250 mg | ORAL_TABLET | Freq: Every day | ORAL | Status: DC
  Administered 2017-05-03 – 2017-05-09 (×7): 0.125 mg via ORAL
  Filled 2017-05-01 (×7): qty 1

## 2017-05-01 MED ORDER — METOLAZONE 5 MG PO TABS
5.0000 mg | ORAL_TABLET | Freq: Every day | ORAL | Status: DC
  Administered 2017-05-01 – 2017-05-06 (×6): 5 mg via ORAL
  Filled 2017-05-01 (×6): qty 1

## 2017-05-01 NOTE — Progress Notes (Signed)
Tech offered Pt a bath. Pt stated that he did not get any sleep last night and would like to do bath later.

## 2017-05-01 NOTE — Progress Notes (Signed)
Advanced Heart Failure VAD Team Note  Subjective:    He has received 2u PRBCs. Hgb 7.6-> 8.9. No BM overnight. Seen by GI. Pending enteroscopy on tomorrow if INR down .  Feels ok this am. Struggling with epistaxis. Left nare currently packed. Diuresing well. No melena. Hgb 8.9 -> 8.2 Breathing improved. Less fatigued. Weight down 1 pound. CVP 16. Co-ox 53% on dobutamine. Dig dose decreased this am due to elevated level.   LDH 152. INR 2.35  LVAD INTERROGATION:  HeartMate II LVAD:  Flow 5.5 liters/min, speed 5900, power 4.7, PI 3.5  Occasional PI events  Objective:    Vital Signs:   Temp:  [97.4 F (36.3 C)-98.6 F (37 C)] 98.1 F (36.7 C) (07/29 0730) Pulse Rate:  [72] 72 (07/29 0730) Resp:  [15-21] 20 (07/29 0730) BP: (78-160)/(61-98) 109/98 (07/29 0800) SpO2:  [90 %-93 %] 91 % (07/29 0730) Weight:  [102.2 kg (225 lb 5 oz)] 102.2 kg (225 lb 5 oz) (07/29 0548) Last BM Date: 05/01/17 Mean arterial Pressure 80s  Intake/Output:   Intake/Output Summary (Last 24 hours) at 05/01/17 1038 Last data filed at 05/01/17 0955  Gross per 24 hour  Intake            566.4 ml  Output             1325 ml  Net           -758.6 ml     Physical Exam    CVP 16 General:  NAD.  HEENT: normal left nare packed Neck: supple. JVP to ear Carotids 2+ bilat; no bruits. No lymphadenopathy or thryomegaly appreciated. Cor: LVAD HM3 hum .  Lungs: Clear. Abdomen: obese soft, nontender, mildly distended. No hepatosplenomegaly. No bruits or masses. Good bowel sounds. Driveline site clean. Anchor in place.  Extremities: no cyanosis, clubbing, rash. Trace 1+ edema  Neuro: alert & oriented x 3. No focal deficits. Moves all 4 without problem    Telemetry   NSR 70s  Personally reviewed   EKG    N/A  Labs   Basic Metabolic Panel:  Recent Labs Lab 04/29/17 1147 04/30/17 0415 05/01/17 0538  NA 128* 130* 130*  K 5.2* 3.9 3.8  CL 92* 93* 92*  CO2 27 31 31   GLUCOSE 205* 157* 167*  BUN 28*  27* 24*  CREATININE 1.44* 1.37* 1.23  CALCIUM 8.7* 8.3* 8.5*    Liver Function Tests: No results for input(s): AST, ALT, ALKPHOS, BILITOT, PROT, ALBUMIN in the last 168 hours. No results for input(s): LIPASE, AMYLASE in the last 168 hours. No results for input(s): AMMONIA in the last 168 hours.  CBC:  Recent Labs Lab 04/29/17 1147 04/30/17 0415 04/30/17 0605 05/01/17 0538  WBC 7.1 6.1  --  6.1  HGB 7.6* 8.7* 8.9* 8.2*  HCT 24.8* 27.7* 29.1* 26.9*  MCV 76.5* 78.9  --  79.4  PLT 212 189  --  167    INR:  Recent Labs Lab 04/29/17 1147 04/30/17 0415 05/01/17 0538  INR 3.70 3.01 2.35    Other results:     Imaging   Dg Chest Port 1 View  Result Date: 04/30/2017 CLINICAL DATA:  Heart failure EXAM: PORTABLE CHEST 1 VIEW COMPARISON:  04/10/2017 FINDINGS: LVAD and pacer remain in place, unchanged. Right internal jugular PICC line now crosses the midline with the tip in the left innominate vein. Cardiomegaly. Bibasilar atelectasis. No pneumothorax. IMPRESSION: Right internal jugular PICC line tip now passes into the left innominate vein. Cardiomegaly  and bibasilar atelectasis, stable. Electronically Signed   By: Rolm Baptise M.D.   On: 04/30/2017 08:04     Medications:     Scheduled Medications: . amiodarone  200 mg Oral Daily  . citalopram  20 mg Oral Daily  . [START ON 05/03/2017] digoxin  0.125 mg Oral Daily  . docusate sodium  200 mg Oral BID  . furosemide  80 mg Intravenous BID  . gabapentin  200 mg Oral BID  . insulin aspart  0-15 Units Subcutaneous TID WC  . insulin aspart  0-5 Units Subcutaneous QHS  . insulin glargine  20 Units Subcutaneous QHS  . losartan  25 mg Oral Daily  . magnesium oxide  400 mg Oral Daily  . pantoprazole  40 mg Oral BID AC  . senna-docusate  2 tablet Oral Daily  . sildenafil  40 mg Oral TID    Infusions: . DOBUTamine 6 mcg/kg/min (05/01/17 0800)    PRN Medications: acetaminophen, clonazePAM, ondansetron (ZOFRAN) IV,  ondansetron, oxymetazoline, traMADol, zolpidem   Patient Profile   Ronald Milleris a 61 y.o.male with history of chronic systolic HF s/p Medtronic ICD 2014, CAD s/p CABG in 2000, HTN, Hx of cocaine abuse, Tobacco abuse, Depression, PAF, and COPD. HM3 placed 09/09/2016.   Presented to clinic 04/29/17 for follow up. Hgb down, INR up. Reports dark black stools and constipation. Admitted for treatment and further evaluation.    Assessment/Plan:    1. Acute on chronic anemia with Melena - likely acute upper GI bleed; IDA - 1 week of black stools with symptoms and drop in Hgb. FOBT pending - Now s/p 2u RBCs. Hgb with suboptimal bump. Hgb down again today. Will transfuse further if Hgb < 8.0.  - GI has seen and pending enteroscopy tomorrow if INR down in 1.5 range. D/w Dr. Loletha Carrow today.  - Holding warfarin and ASA. INR 3.1-> 2.35 today Continue to hold. Heparin once INR < 1.8 D/w PharmD.  - Iron low. Received Feraheme on 7/28 - Continue IV protonix  2. Acute/Chronic end-stage biventricular systolic MP:NTIR 44% due to ICM ->s/p Echo 08/24/16 LVEF 15%, RV mild dilated, moderately reduced. -->S/P HM3 LVAD 31/02/4007.  - Complicated by RV failure. Initially on milrinone but stopped due to low mixed venous saturation, Remains on home dobutamine 1mcg/kg/min.  - Continues to struggle with severe RV failure.Co-ox marginal today  - On admit weight 233. Remains on IV lasix. Now 225  Baseline ~210. CVP 23-> 16 (checked personally) Will continue IV lasix. Add metolazone today.  - MAPs and renal function stable. Continue losartan 25 mg daily.  - No beta blocker with RV failure - Digoxin level elevated. Dose held today and dropped to 0.125 mcg daily.   3. CAD: No CP. 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  -No s/s ischemia  Continue statin. Off ASA due to GIB  4. DMII:  - Continue scheduled insulin and SSI  5. Tobacco/substance abuse:  - Currently not  using  6. Atrial fibrillation, paroxysmal - Maintaining NSR.   7. Anxiety/Depression - Continue Celexa 20 mg daily and klonipin. No change.   8. RUE DVT.  - Off coumadin with GI bleed.  Follow for resumption.   9. Hyponatremia:  - 128 on admit. Free water restrict. Stable at 130 today   10. CKD stage 3 - Stable. Watch closely with diuresis  I reviewed the LVAD parameters from today, and compared the results to the patient's prior recorded  data.  No programming changes were made.  The LVAD is functioning within specified parameters.  The patient performs LVAD self-test daily.  LVAD interrogation was negative for any significant power changes, alarms or PI events/speed drops.  LVAD equipment check completed and is in good working order.  Back-up equipment present.   LVAD education done on emergency procedures and precautions and reviewed exit site care.  Length of Stay: 2  Glori Bickers, MD 05/01/2017, 10:38 AM  VAD Team --- VAD ISSUES ONLY--- Pager [phone] (7am - 7am)  Advanced Heart Failure Team  Pager [phone] (M-F; 7a - 4p)  Please contact [facility] for night-coverage after hours (4p -7a ) and weekends on [email or web address]

## 2017-05-01 NOTE — Progress Notes (Signed)
2nd attempt, Tech offered bath, Pt continues to refused.

## 2017-05-01 NOTE — Progress Notes (Signed)
 GI Progress Note  Chief Complaint: Melena and anemia  Subjective  History:  There's been no reported melena, and the patient as he doesn't check his stool to see if there is bleeding. He still has no abdominal pain. Adjustments were made to his dobutamine and digoxin today. He had some bleeding from both nostrils in the last 24 hours. ROS: Cardiovascular:  no chest pain Respiratory: no dyspnea  Objective:  Med list reviewed  Vital signs in last 24 hrs: Vitals:   05/01/17 0730 05/01/17 0800  BP: (!) 87/69 (!) 109/98  Pulse: 72   Resp: 20   Temp: 98.1 F (36.7 C)     Physical Exam   HEENT: sclera anicteric, oral mucosa moist without lesions  Neck: supple, no thyromegaly, JVD or lymphadenopathy  Cardiac: LVAD hum, peripheral edema with venous stasis changes as before  Pulm: clear to auscultation bilaterally, normal RR and effort noted  Abdomen: soft, no tenderness, with active bowel sounds. No guarding or palpable hepatosplenomegaly  Skin; warm and dry, no jaundice or rash  Recent Labs:   Recent Labs Lab 04/29/17 1147 04/30/17 0415 04/30/17 0605 05/01/17 0538  WBC 7.1 6.1  --  6.1  HGB 7.6* 8.7* 8.9* 8.2*  HCT 24.8* 27.7* 29.1* 26.9*  PLT 212 189  --  167    Recent Labs Lab 05/01/17 0538  NA 130*  K 3.8  CL 92*  CO2 31  BUN 24*  GLUCOSE 167*    Recent Labs Lab 05/01/17 0538  INR 2.35    @ASSESSMENTPLANBEGIN @ Assessment:  Melena Acute blood loss anemia Chronic anticoagulation LVAD patient severe systolic heart failure. He is known to have right heart failure as well   Plan: Check CBC and INR tomorrow Small bowel enteroscopy with Dr. Silverio Decamp tomorrow if INR acceptable.  Increased risk procedure due to the patient's comorbidities.   Nelida Meuse III Pager [phone] Mon-Fri 8a-5p [phone] after 5p, weekends, holidays

## 2017-05-01 NOTE — Progress Notes (Addendum)
ANTICOAGULATION CONSULT NOTE - Follow Up Consult  Pharmacy Consult for Heparin Indication: atrial fibrillation  Allergies  Allergen Reactions  . Codeine Nausea And Vomiting and Other (See Comments)    In high doses  . Trazodone And Nefazodone Other (See Comments)    Dizziness with subsequent fall   . Lipitor [Atorvastatin] Nausea Only and Other (See Comments)    Nausea with high doses, tolerates 20mg  dose (08/22/16)  . Tape Other (See Comments)    Paper tape is ok    Patient Measurements: Heparin Dosing Weight: 81.5 kg  Vital Signs: Temp: 98.1 F (36.7 C) (07/29 0730) Temp Source: Oral (07/29 0730) BP: 109/98 (07/29 0800) Pulse Rate: 72 (07/29 0730)  Labs:  Recent Labs  04/29/17 1147 04/30/17 0415 04/30/17 0605 05/01/17 0538  HGB 7.6* 8.7* 8.9* 8.2*  HCT 24.8* 27.7* 29.1* 26.9*  PLT 212 189  --  167  LABPROT 37.6* 31.9*  --  26.1*  INR 3.70 3.01  --  2.35  CREATININE 1.44* 1.37*  --  1.23    Estimated Creatinine Clearance: 67.8 mL/min (by C-G formula based on SCr of 1.23 mg/dL).   Medical History: Past Medical History:  Diagnosis Date  . AICD (automatic cardioverter/defibrillator) present   . ASCVD (arteriosclerotic cardiovascular disease)   . Chronic systolic CHF (congestive heart failure) ([place])   . COPD (chronic obstructive pulmonary disease) ([place])   . Coronary artery disease   . Depression   . Diabetes mellitus   . History of cocaine abuse   . Hypertension   . Presence of permanent cardiac pacemaker   . Shortness of breath dyspnea   . Suicidal ideation   . Tobacco abuse     Assessment: 55 yoM with end-stage heart failure (LVAD placement in 09/2016) on outpatient Dobutamione drip 36mcg/kg/min  for RV failure coox 60s drop 52 today-  with digoxin 0.25 level 1.1 will drip 0.176mcg qd, and sildenafil 40tid, Losartan 25  MAPs stable 70-80s  PAF on amio now SR  On coumadin PTA 15mg  TTS/10mg  MWFSu admit INR 3.7 - being admitted with HGB drop and melena>  s/p 2 uts PRBC slight increase in h/h post transfusion but now trending back down.  Hold warfarin for GI work up. Pharmacy consulted to start Heparin when INR<1.8  INR 2.3 today, LDH stable 150s  Goal of Therapy:  Heparin level 0.3-0.7 units/ml INR 1.8-2.3 Monitor platelets by anticoagulation protocol: Yes   Plan:  Hold warfarin Follow INR Start heparin drip when INR < 1.8 - probably in am  Bonnita Nasuti Pharm.D. CPP, BCPS Clinical Pharmacist [phone] 05/01/2017 9:06 AM

## 2017-05-02 ENCOUNTER — Inpatient Hospital Stay (HOSPITAL_COMMUNITY): Payer: Medicare HMO | Admitting: Anesthesiology

## 2017-05-02 ENCOUNTER — Ambulatory Visit: Payer: Self-pay

## 2017-05-02 ENCOUNTER — Other Ambulatory Visit (HOSPITAL_COMMUNITY): Payer: Medicare HMO

## 2017-05-02 ENCOUNTER — Encounter (HOSPITAL_COMMUNITY)
Admission: AD | Disposition: A | Discharge status: Home Health | Payer: Self-pay | Source: Ambulatory Visit | Attending: Internal Medicine

## 2017-05-02 ENCOUNTER — Encounter (HOSPITAL_COMMUNITY): Payer: Self-pay | Admitting: *Deleted

## 2017-05-02 DIAGNOSIS — K2971 Gastritis, unspecified, with bleeding: Secondary | ICD-10-CM

## 2017-05-02 DIAGNOSIS — K2901 Acute gastritis with bleeding: Secondary | ICD-10-CM

## 2017-05-02 DIAGNOSIS — K2950 Unspecified chronic gastritis without bleeding: Secondary | ICD-10-CM

## 2017-05-02 DIAGNOSIS — K3189 Other diseases of stomach and duodenum: Secondary | ICD-10-CM

## 2017-05-02 HISTORY — PX: ENTEROSCOPY: SHX5533

## 2017-05-02 LAB — COOXEMETRY PANEL
CARBOXYHEMOGLOBIN: 1.7 % — AB (ref 0.5–1.5)
CARBOXYHEMOGLOBIN: 2 % — AB (ref 0.5–1.5)
METHEMOGLOBIN: 1.1 % (ref 0.0–1.5)
Methemoglobin: 1.4 % (ref 0.0–1.5)
O2 Saturation: 57.5 %
O2 Saturation: 68.8 %
Total hemoglobin: 7.4 g/dL — ABNORMAL LOW (ref 12.0–16.0)
Total hemoglobin: 7.5 g/dL — ABNORMAL LOW (ref 12.0–16.0)

## 2017-05-02 LAB — GLUCOSE, CAPILLARY
GLUCOSE-CAPILLARY: 129 mg/dL — AB (ref 65–99)
GLUCOSE-CAPILLARY: 145 mg/dL — AB (ref 65–99)
Glucose-Capillary: 158 mg/dL — ABNORMAL HIGH (ref 65–99)
Glucose-Capillary: 136 mg/dL — ABNORMAL HIGH (ref 65–99)

## 2017-05-02 LAB — BASIC METABOLIC PANEL
ANION GAP: 9 (ref 5–15)
BUN: 20 mg/dL (ref 6–20)
CALCIUM: 8.3 mg/dL — AB (ref 8.9–10.3)
CHLORIDE: 87 mmol/L — AB (ref 101–111)
CO2: 32 mmol/L (ref 22–32)
Creatinine, Ser: 1.17 mg/dL (ref 0.61–1.24)
GFR calc Af Amer: >60 mL/min (ref >60)
GFR calc non Af Amer: >60 mL/min (ref >60)
GLUCOSE: 173 mg/dL — AB (ref 65–99)
Potassium: 3.3 mmol/L — ABNORMAL LOW (ref 3.5–5.1)
Sodium: 128 mmol/L — ABNORMAL LOW (ref 135–145)

## 2017-05-02 LAB — CBC
HEMATOCRIT: 27.9 % — AB (ref 39.0–52.0)
HEMOGLOBIN: 8.7 g/dL — AB (ref 13.0–17.0)
MCH: 24.6 pg — ABNORMAL LOW (ref 26.0–34.0)
MCHC: 31.2 g/dL (ref 30.0–36.0)
MCV: 79 fL (ref 78.0–100.0)
Platelets: 195 10*3/uL (ref 150–400)
RBC: 3.53 MIL/uL — ABNORMAL LOW (ref 4.22–5.81)
RDW: 19.2 % — AB (ref 11.5–15.5)
WBC: 6.9 10*3/uL (ref 4.0–10.5)

## 2017-05-02 LAB — PROTIME-INR
INR: 1.59
Prothrombin Time: 19.1 seconds — ABNORMAL HIGH (ref 11.4–15.2)

## 2017-05-02 LAB — HEPARIN LEVEL (UNFRACTIONATED): Heparin Unfractionated: 0.1 IU/mL — ABNORMAL LOW (ref 0.30–0.70)

## 2017-05-02 LAB — LACTATE DEHYDROGENASE: LDH: 156 U/L (ref 98–192)

## 2017-05-02 SURGERY — ENTEROSCOPY
Anesthesia: Monitor Anesthesia Care

## 2017-05-02 MED ORDER — WARFARIN SODIUM 10 MG PO TABS
10.0000 mg | ORAL_TABLET | Freq: Once | ORAL | Status: AC
  Administered 2017-05-02: 10 mg via ORAL
  Filled 2017-05-02: qty 1

## 2017-05-02 MED ORDER — SODIUM CHLORIDE 0.9 % IV SOLN
INTRAVENOUS | Status: DC
  Administered 2017-05-02 (×2): via INTRAVENOUS

## 2017-05-02 MED ORDER — PROPOFOL 10 MG/ML IV BOLUS
INTRAVENOUS | Status: DC | PRN
  Administered 2017-05-02: 100 mg via INTRAVENOUS

## 2017-05-02 MED ORDER — WARFARIN - PHARMACIST DOSING INPATIENT: Freq: Every day | Status: DC

## 2017-05-02 MED ORDER — EPHEDRINE SULFATE 50 MG/ML IJ SOLN
INTRAMUSCULAR | Status: DC | PRN
  Administered 2017-05-02 (×2): 10 mg via INTRAVENOUS

## 2017-05-02 MED ORDER — MIDAZOLAM HCL 5 MG/5ML IJ SOLN
INTRAMUSCULAR | Status: DC | PRN
  Administered 2017-05-02: 2 mg via INTRAVENOUS

## 2017-05-02 MED ORDER — HEPARIN (PORCINE) IN NACL 100-0.45 UNIT/ML-% IJ SOLN
1700.0000 [IU]/h | INTRAMUSCULAR | Status: DC
  Administered 2017-05-02: 1000 [IU]/h via INTRAVENOUS
  Administered 2017-05-03 – 2017-05-04 (×2): 1600 [IU]/h via INTRAVENOUS
  Administered 2017-05-06 – 2017-05-07 (×2): 1700 [IU]/h via INTRAVENOUS
  Filled 2017-05-02 (×8): qty 250

## 2017-05-02 MED ORDER — POTASSIUM CHLORIDE CRYS ER 20 MEQ PO TBCR
40.0000 meq | EXTENDED_RELEASE_TABLET | Freq: Two times a day (BID) | ORAL | Status: DC
  Administered 2017-05-02 – 2017-05-07 (×11): 40 meq via ORAL
  Filled 2017-05-02 (×11): qty 2

## 2017-05-02 MED ORDER — PROPOFOL 500 MG/50ML IV EMUL
INTRAVENOUS | Status: DC | PRN
  Administered 2017-05-02: 75 ug/kg/min via INTRAVENOUS

## 2017-05-02 MED ORDER — SPIRONOLACTONE 25 MG PO TABS
12.5000 mg | ORAL_TABLET | Freq: Every day | ORAL | Status: DC
  Administered 2017-05-02: 12.5 mg via ORAL
  Filled 2017-05-02 (×2): qty 1

## 2017-05-02 MED ORDER — SUCRALFATE 1 GM/10ML PO SUSP
1.0000 g | Freq: Three times a day (TID) | ORAL | Status: DC
  Administered 2017-05-02 – 2017-05-09 (×29): 1 g via ORAL
  Filled 2017-05-02 (×33): qty 10

## 2017-05-02 NOTE — Transfer of Care (Signed)
Immediate Anesthesia Transfer of Care Note  Patient: Ronald Miller.  Procedure(s) Performed: Procedure(s): ENTEROSCOPY (N/A)  Patient Location: PACU  Anesthesia Type:MAC  Level of Consciousness: awake, alert , oriented and patient cooperative  Airway & Oxygen Therapy: Patient Spontanous Breathing and Patient connected to nasal cannula oxygen  Post-op Assessment: Report given to RN, Post -op Vital signs reviewed and stable, Patient moving all extremities and Patient moving all extremities X 4  Post vital signs: Reviewed and stable  Last Vitals:  Vitals:   05/02/17 0804 05/02/17 0930  BP: (!) 82/41 109/79  Pulse: (!) 41 75  Resp: (!) 21 18  Temp: 36.9 C (!) 36.4 C    Last Pain:  Vitals:   05/02/17 0930  TempSrc: Oral  PainSc:          Complications: No apparent anesthesia complications

## 2017-05-02 NOTE — Op Note (Addendum)
[facility] Patient Name: Ronald Miller Procedure Date : 05/02/2017 MRN: [id number] Attending MD: Mauri Pole , MD Date of Birth: 1956-04-11 CSN: [id number] Age: 61 Admit Type: Inpatient Procedure:                Small bowel enteroscopy Indications:              Acute post hemorrhagic anemia Providers:                Mauri Pole, MD, Cleda Daub, RN, Alan Mulder, Technician Referring MD:             [place], MD Medicines:                Monitored Anesthesia Care Complications:            No immediate complications. Estimated Blood Loss:     Estimated blood loss was minimal. Procedure:                Pre-Anesthesia Assessment:                           - Prior to the procedure, a History and Physical                            was performed, and patient medications and                            allergies were reviewed. The patient's tolerance of                            previous anesthesia was also reviewed. The risks                            and benefits of the procedure and the sedation                            options and risks were discussed with the patient.                            All questions were answered, and informed consent                            was obtained. Prior Anticoagulants: The patient                            last took Coumadin (warfarin) 7 days prior to the                            procedure. ASA Grade Assessment: III - A patient                            with severe systemic disease. After reviewing the  risks and benefits, the patient was deemed in                            satisfactory condition to undergo the procedure.                           After obtaining informed consent, the endoscope was                            passed under direct vision. Throughout the                            procedure, the patient's blood  pressure, pulse, and                            oxygen saturations were monitored continuously. The                            VF-[id number] ([id number]) scope was introduced through the                            mouth and advanced to the mid-jejunum. The small                            bowel enteroscopy was accomplished without                            difficulty. The patient tolerated the procedure                            well. Scope In: Scope Out: Findings:      There was no evidence of significant pathology in the visualized portion       of proximal and mid jejunum.      A single 5 mm submucosal nodule, lipoma was found in the third portion       of the duodenum.      Otherwise there was no evidence of significant pathology in the entire       examined duodenum.      Localized severe inflammation with hemorrhage characterized by erosions,       erythema, friability, aphthous ulcerations and target ulcerations was       found in the gastric antrum. Biopsies were taken with a cold forceps for       histology. Biopsies were taken with a cold forceps for Helicobacter       pylori testing.      The examined esophagus was normal. Impression:               - The examined portion of the jejunum was normal.                           - Submucosal duodenal lipoma                           - Otherwise examined duodenum appeared normal.                           -  Gastritis with ulceration and hemorrhage.                            Biopsied to exclude H.pylori gastritis                           - Normal esophagus. Moderate Sedation:      N/A Recommendation:           - Resume previous diet.                           - Continue present medications.                           - Omeprazole 40mg  BID before breakfast and dinner X                            2 months                           - Carafate 1 gm before meals and at bedtime X 2                            weeks                            - Resume Coumadin (warfarin) at prior dose today.                            Refer to managing physician for further adjustment                            of therapy.                           - Monitor Hgb and transfuse as needed Procedure Code(s):        --- Professional ---                           [phone], Small intestinal endoscopy, enteroscopy                            beyond second portion of duodenum, not including                            ileum; with biopsy, single or multiple Diagnosis Code(s):        --- Professional ---                           K31.89, Other diseases of stomach and duodenum                           K29.71, Gastritis, unspecified, with bleeding                           D62, Acute posthemorrhagic anemia CPT copyright 2016 American Medical  Association. All rights reserved. The codes documented in this report are preliminary and upon coder review may  be revised to meet current compliance requirements. Mauri Pole, MD 05/02/2017 10:45:02 AM This report has been signed electronically. Number of Addenda: 0

## 2017-05-02 NOTE — Progress Notes (Signed)
LVAD Coordinator Rounding Note:  Admitted 7/27/2018due to a possible GI bleed.   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 Prescott Gum.   Vital signs: HR: 76 vpaced Doppler Pressure: 91 Automatic BP: 113/90 (90) O2 Sat: 98 RA Wt: 224lbs  LVAD interrogation reveals:  Speed:5900 Flow: 5.5 Power: 4.7 PI: 2.0 Alarms: none Events: 14 PI events daily Fixed speed: 5900 Low speed limit: 5600 HCT: 28  Drive Line: changed 0/92, will need to be changed again 8/3.  Labs:  LDH trend: 156  INR trend: 3.01>2.35>1.59  HGB trend: 7.6>8.7>8 .9>8.2>8.7  Anticoagulation Plan: -INR Goal: 2-2.5 -ASA Dose: 81 mg  Adverse Events on VAD: -11/30/16> Milrinone gtt at discharge -01/27/17>dobutamine gtt at discharge  Blood products this admission:  04/30/17- 2 units PC  Gtts: Dobutamine 6 mcg   Plan/Recommendations:   1. Pt scheduled for enteroscopy today. Please page VAD coordinator prior to procedure.   Tanda Rockers RN, VAD Coordinator 24/7 pager [phone]

## 2017-05-02 NOTE — Anesthesia Procedure Notes (Signed)
Procedure Name: MAC Date/Time: 05/02/2017 10:12 AM Performed by: Izora Gala Pre-anesthesia Checklist: Patient identified, Emergency Drugs available, Suction available and Patient being monitored Patient Re-evaluated:Patient Re-evaluated prior to induction Oxygen Delivery Method: Nasal cannula Preoxygenation: Pre-oxygenation with 100% oxygen Induction Type: IV induction Placement Confirmation: positive ETCO2

## 2017-05-02 NOTE — Progress Notes (Signed)
CARDIAC REHAB PHASE I   Second attempt to walk with pt. Pt asleep both times, RN notified. Will follow-up tomorrow.   Lenna Sciara, RN, BSN 05/02/2017 2:55 PM

## 2017-05-02 NOTE — Progress Notes (Signed)
ANTICOAGULATION CONSULT NOTE - Follow Up Consult  Pharmacy Consult for Heparin > warfarin Indication: atrial fibrillation  Allergies  Allergen Reactions  . Codeine Nausea And Vomiting and Other (See Comments)    In high doses  . Trazodone And Nefazodone Other (See Comments)    Dizziness with subsequent fall   . Lipitor [Atorvastatin] Nausea Only and Other (See Comments)    Nausea with high doses, tolerates 20mg  dose (08/22/16)  . Tape Other (See Comments)    Paper tape is ok    Patient Measurements: Heparin Dosing Weight: 81.5 kg  Vital Signs: Temp: 97.9 F (36.6 C) (07/30 1648) Temp Source: Axillary (07/30 1648) BP: 91/75 (07/30 1300) Pulse Rate: 74 (07/30 1259)  Labs:  Recent Labs  04/30/17 0415 04/30/17 0605 05/01/17 0538 05/02/17 0431 05/02/17 1828  HGB 8.7* 8.9* 8.2* 8.7*  --   HCT 27.7* 29.1* 26.9* 27.9*  --   PLT 189  --  167 195  --   LABPROT 31.9*  --  26.1* 19.1*  --   INR 3.01  --  2.35 1.59  --   HEPARINUNFRC  --   --   --   --  0.10*  CREATININE 1.37*  --  1.23 1.17  --     Estimated Creatinine Clearance: 71 mL/min (by C-G formula based on SCr of 1.17 mg/dL).  Assessment: 40 yoM with end-stage heart failure (LVAD placement in 09/2016) on outpatient Dobutamione drip 60mcg/kg/min  for RV failure coox 60s -  with digoxin 0.25 level 1.1 dose dropped 0.159mcg qd, and sildenafil 40tid, Losartan 25  MAPs stable 70-80s  PAF on amio now SR  On coumadin PTA 15mg  TTS/10mg  MWFSu admit INR 3.7 - being admitted with HGB drop and melena> s/p 2 uts PRBC slight increase in h/h post transfusion.  Held warfarin for GI work up> s/p EGD with ulcerative changes but not active bleeding> begin sucralfate and ok to restart [facility]  Pharmacy consulted to start Heparin when INR<1.8  INR 1.5 today, LDH stable 150s Heparin drip at 1000 units/hr resulted in low heparin level of 0.10. No issues noted, will increase rate.  Note that patient previously required 1600-1800 units/hr to  achieve therapeutic levels, will titrate a little slower this admission given anemia.   Goal of Therapy:  Heparin level 0.3-0.5 INR 2-2.5 Monitor platelets by anticoagulation protocol: Yes   Plan:  Increase Heparin drip to 1400 units/hr Recheck heparin level in 6 hours  Erin Hearing PharmD., BCPS Clinical Pharmacist Pager [phone] 05/02/2017 7:42 PM

## 2017-05-02 NOTE — Progress Notes (Signed)
Advanced Heart Failure VAD Team Note  Subjective:    He has received 2u PRBCs. Hgb 7.6-> 8.9. Had enteroscopy earlier today. No active bleeding but ulcers in his stomach.   Yesterday diuresed with IV lasix + metolazone. Brisk diuresis noted.   Feeling better. Denies SOB.   LDH 156. INR 1.59   LVAD INTERROGATION:  HeartMate II LVAD:  Flow 5.5 liters/min, speed 5950, power 4.6, PI 3.1 Over 36 PI events in the last 24 hours.   Objective:    Vital Signs:   Temp:  [97.4 F (36.3 C)-98.4 F (36.9 C)] 97.9 F (36.6 C) (07/30 1043) Pulse Rate:  [41-76] 75 (07/30 1053) Resp:  [12-22] 19 (07/30 1053) BP: (70-113)/(41-90) 96/62 (07/30 1053) SpO2:  [93 %-98 %] 95 % (07/30 1053) Weight:  [224 lb (101.6 kg)] 224 lb (101.6 kg) (07/30 0650) Last BM Date: 05/01/17 Mean arterial Pressure 60-80s   Intake/Output:   Intake/Output Summary (Last 24 hours) at 05/02/17 1119 Last data filed at 05/02/17 1026  Gross per 24 hour  Intake            457.8 ml  Output             3475 ml  Net          -3017.2 ml     Physical Exam    CVP 13-14  Physical Exam: GENERAL: NAD. In bed.  HEENT: normal  NECK: Supple, JVP to jaw .  2+ bilaterally, no bruits.  No lymphadenopathy or thyromegaly appreciated.   CARDIAC:  Mechanical heart sounds with LVAD hum present. R upper chest tunnels PICC LUNGS:  Clear to auscultation bilaterally.  ABDOMEN:  Soft, round, nontender, positive bowel sounds x4.     LVAD exit site: well-healed and incorporated.  Dressing dry and intact.  No erythema or drainage.  Stabilization device present and accurately applied.  Driveline dressing is being changed daily per sterile technique. EXTREMITIES:  Warm and dry, no cyanosis, clubbing, rash. RLE and LLE trace edema  NEUROLOGIC:  Alert and oriented x 4.  Gait steady.  No aphasia.  No dysarthria.  Affect pleasant.      Telemetry   NSR 70s personally reviewed.    EKG    N/A  Labs   Basic Metabolic Panel:  Recent  Labs Lab 04/29/17 1147 04/30/17 0415 05/01/17 0538 05/02/17 0431  NA 128* 130* 130* 128*  K 5.2* 3.9 3.8 3.3*  CL 92* 93* 92* 87*  CO2 27 31 31  32  GLUCOSE 205* 157* 167* 173*  BUN 28* 27* 24* 20  CREATININE 1.44* 1.37* 1.23 1.17  CALCIUM 8.7* 8.3* 8.5* 8.3*    Liver Function Tests: No results for input(s): AST, ALT, ALKPHOS, BILITOT, PROT, ALBUMIN in the last 168 hours. No results for input(s): LIPASE, AMYLASE in the last 168 hours. No results for input(s): AMMONIA in the last 168 hours.  CBC:  Recent Labs Lab 04/29/17 1147 04/30/17 0415 04/30/17 0605 05/01/17 0538 05/02/17 0431  WBC 7.1 6.1  --  6.1 6.9  HGB 7.6* 8.7* 8.9* 8.2* 8.7*  HCT 24.8* 27.7* 29.1* 26.9* 27.9*  MCV 76.5* 78.9  --  79.4 79.0  PLT 212 189  --  167 195    INR:  Recent Labs Lab 04/29/17 1147 04/30/17 0415 05/01/17 0538 05/02/17 0431  INR 3.70 3.01 2.35 1.59    Other results:     Imaging   No results found.   Medications:     Scheduled Medications: . amiodarone  200 mg Oral Daily  . citalopram  20 mg Oral Daily  . [START ON 05/03/2017] digoxin  0.125 mg Oral Daily  . docusate sodium  200 mg Oral BID  . furosemide  80 mg Intravenous BID  . gabapentin  200 mg Oral BID  . insulin aspart  0-15 Units Subcutaneous TID WC  . insulin aspart  0-5 Units Subcutaneous QHS  . insulin glargine  20 Units Subcutaneous QHS  . losartan  25 mg Oral Daily  . magnesium oxide  400 mg Oral Daily  . metolazone  5 mg Oral Daily  . pantoprazole  40 mg Oral BID AC  . potassium chloride  40 mEq Oral Daily  . senna-docusate  2 tablet Oral Daily  . sildenafil  40 mg Oral TID  . sucralfate  1 g Oral TID WC & HS    Infusions: . sodium chloride 20 mL/hr at 05/02/17 0935  . DOBUTamine 6 mcg/kg/min (05/02/17 0823)    PRN Medications: acetaminophen, clonazePAM, ondansetron (ZOFRAN) IV, ondansetron, oxymetazoline, traMADol, zolpidem   Patient Profile   Ronald Milleris a 61 y.o.male  with history of chronic systolic HF s/p [employer] ICD 2014, CAD s/p CABG in 2000, HTN, Hx of cocaine abuse, Tobacco abuse, Depression, PAF, and COPD. HM3 placed 09/09/2016.   Presented to clinic 04/29/17 for follow up. Hgb down, INR up. Reports dark black stools and constipation. Admitted for treatment and further evaluation.    Assessment/Plan:    1. Acute on chronic anemia with Melena - likely acute upper GI bleed; IDA - 1 week of black stools with symptoms and drop in Hgb.- Iron low. Received Feraheme on 7/28  - Now s/p 2u RBCs. Hgb with suboptimal bump. Hgb down again today. Will transfuse further if Hgb < 8.0.  - Had scope with ulcers in stomach. Add carafate and continue PPI twice a day.  -Per GI ok to resume anticoagulants. No aspirin.  -Check CBC in am.    2. Acute/Chronic end-stage biventricular systolic IO:EVOJ 50% due to ICM ->s/p Echo 08/24/16 LVEF 15%, RV mild dilated, moderately reduced. -->S/P HM3 LVAD 06/06/8181.  - Complicated by RV failure. Initially on milrinone but stopped due to low mixed venous saturation, Remains on home dobutamine 7mcg/kg/min. Todays CO-OX is stable 69%.   - Continues to struggle with severe RV failure. - On admit weight 233>224. CVP coming down to 13-14. One more day of aggressive diuresis. (prior to admit he was on torsemide 100 mg twice a day) - Continue IV lasix + metolazone. Add 12.5 mg spiro daily. (prior to admit he was on 25 mg spiro  daily)  - MAPs and renal function stable.  -Continue losartan 25 mg daily.  - No beta blocker with RV failure - Digoxin level elevated. Dose held yesterday and dropped to 0.125 mcg daily.   3. CAD: No CP. 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  -No s/s ischemia  Continue statin. Off ASA due to GIB  4. DMII:  - Continue scheduled insulin and SSI  5. Tobacco/substance abuse:  - Currently not using  6. Atrial fibrillation, paroxysmal - Maintaining NSR.   7.  Anxiety/Depression - Continue current dose of celexa and klonipin.    8. RUE DVT.  - Restart coumadin today.    9. Hyponatremia:  - sodium 128. Restrict free water.   10. CKD stage 3 - creatinine stable 1.17.   Consult cardiac rehab. He plans to go to home  with his sister. He will need [facility] for home dobutamine 6 mcg. He has used [facility] in the past.   I reviewed the LVAD parameters from today, and compared the results to the patient's prior recorded data.  No programming changes were made.  The LVAD is functioning within specified parameters.  The patient performs LVAD self-test daily.  LVAD interrogation was negative for any significant power changes, alarms or PI events/speed drops.  LVAD equipment check completed and is in good working order.  Back-up equipment present.   LVAD education done on emergency procedures and precautions and reviewed exit site care.  Length of Stay: 3  Darrick Grinder, NP 05/02/2017, 11:19 AM  VAD Team --- VAD ISSUES ONLY--- Pager [phone] (7am - 7am)  Advanced Heart Failure Team  Pager [phone] (M-F; 7a - 4p)  Please contact [facility] for night-coverage after hours (4p -7a ) and weekends on [email or web address]  Patient seen and examined with Darrick Grinder, NP. We discussed all aspects of the encounter. I agree with the assessment and plan as stated above.   Remains tenuous but improving.   Results of EGD reviewed personally. He has severe gastritis with diffuse ulceration and hemorrhage. No obvious AVMs. Hgb stable today. Ok to resume heparin/warfarin. Will need high-dose PPI. Await H. Pylori serologies.  Volume status improving but still elevated. CVP 13-14. Will continue IV lasix and dobutamine.   VAD parameters stable.   Glori Bickers, MD  3:50 PM

## 2017-05-02 NOTE — Progress Notes (Addendum)
ANTICOAGULATION CONSULT NOTE - Follow Up Consult  Pharmacy Consult for Heparin > warfarin Indication: atrial fibrillation  Allergies  Allergen Reactions  . Codeine Nausea And Vomiting and Other (See Comments)    In high doses  . Trazodone And Nefazodone Other (See Comments)    Dizziness with subsequent fall   . Lipitor [Atorvastatin] Nausea Only and Other (See Comments)    Nausea with high doses, tolerates 20mg  dose (08/22/16)  . Tape Other (See Comments)    Paper tape is ok    Patient Measurements: Heparin Dosing Weight: 81.5 kg  Vital Signs: Temp: 97.9 F (36.6 C) (07/30 1043) Temp Source: Oral (07/30 1043) BP: 96/62 (07/30 1053) Pulse Rate: 75 (07/30 1053)  Labs:  Recent Labs  04/30/17 0415 04/30/17 0605 05/01/17 0538 05/02/17 0431  HGB 8.7* 8.9* 8.2* 8.7*  HCT 27.7* 29.1* 26.9* 27.9*  PLT 189  --  167 195  LABPROT 31.9*  --  26.1* 19.1*  INR 3.01  --  2.35 1.59  CREATININE 1.37*  --  1.23 1.17    Estimated Creatinine Clearance: 71 mL/min (by C-G formula based on SCr of 1.17 mg/dL).   Medical History: Past Medical History:  Diagnosis Date  . AICD (automatic cardioverter/defibrillator) present   . ASCVD (arteriosclerotic cardiovascular disease)   . Chronic systolic CHF (congestive heart failure) ([place])   . COPD (chronic obstructive pulmonary disease) ([place])   . Coronary artery disease   . Depression   . Diabetes mellitus   . History of cocaine abuse   . Hypertension   . Presence of permanent cardiac pacemaker   . Shortness of breath dyspnea   . Suicidal ideation   . Tobacco abuse     Assessment: 68 yoM with end-stage heart failure (LVAD placement in 09/2016) on outpatient Dobutamione drip 79mcg/kg/min  for RV failure coox 60s -  with digoxin 0.25 level 1.1 dose dropped 0.119mcg qd, and sildenafil 40tid, Losartan 25  MAPs stable 70-80s  PAF on amio now SR  On coumadin PTA 15mg  TTS/10mg  MWFSu admit INR 3.7 - being admitted with HGB drop and melena> s/p  2 uts PRBC slight increase in h/h post transfusion.  Held warfarin for GI work up> s/p EGD with ulcerative changes but not active bleeding> begin sucralfate and ok to restart [facility] consulted to start Heparin when INR<1.8  INR 1.5 today, LDH stable 150s Heparin drip 1000 units/hr  Restart warfarin tonight  Goal of Therapy:  Heparin level 0.3-0.5 INR 2-2.5 Monitor platelets by anticoagulation protocol: Yes   Plan:  Warfarin 10mg  x1 Heparin drip 1000 uts/hr  HL in 6hr from start and daily with CBC   Bonnita Nasuti Pharm.D. CPP, BCPS Clinical Pharmacist [phone] 05/02/2017 11:44 AM

## 2017-05-02 NOTE — Progress Notes (Signed)
[facility]  Active pt with [facility] prior to this admission.  [facility] providing home infusion pharmacy for outpatient Dobutamine.  [facility] providing IV Dobutamine while pt is a resident at [facility]. Rehab.  [facility] will follow Mr. Regis until DC to support DC to [facility] or home as ordered/desired.  If patient discharges after hours, please call [phone].   [name] 05/02/2017, 10:23 AM

## 2017-05-02 NOTE — Progress Notes (Signed)
VAD Coordinator Procedure Note:   Patient underwent enteroscopy. Hemodynamics and VAD parameters monitored by me throughout the procedure. MAPs were obtained with manual BP cuff on the right arm.      Doppler Auto cuff(MAP):  Flow: PI: Power:     Speed:      Time:          Pre-procedure:  112/98(106)      5.5  3.2   4.7     5900  1011   Sedation Induction:  83/69(76)      5.5  3.1  4.7       5900  1018      88/68(76)      5.5  2.6  4.6       5900  1025       72/60(66)      5.6  3.0  4.6       5900  1030   Interventions:  Ulcers in stomach biopsied to r/o h. Pylori. No activate bleeding found.   Recovery area:  89/70(76)      5.5  3.1   4.7       5900  1035   Patient tolerated the procedure well. PIs were 2.6-3.2 throughout the case with no power elevations. After adequate sedation was achieved, pulse ox 99% and maintained >92% throughout the remainder of the procedure. MAPs were 66-106.   Patient Disposition: Pt was escorted back to his room on batteries and handoff given to bedside RN.

## 2017-05-02 NOTE — H&P (Signed)
[facility] History and Physical   Primary Care Physician:  [facility], [facility]   Reason for Procedure:   Anemia  Plan:    Push enteroscopy     HPI: Ronald Miller. is a 61 y.o. male with severe CHF s/p LVAD on coumadin admitted with worsening anemia and heme positive stool.  INR 1.59 this am and Hgb stable at 8.7 s/p 1uprbc from 7.6 at admission.   Past Medical History:  Diagnosis Date  . AICD (automatic cardioverter/defibrillator) present   . ASCVD (arteriosclerotic cardiovascular disease)   . Chronic systolic CHF (congestive heart failure) ([place])   . COPD (chronic obstructive pulmonary disease) ([place])   . Coronary artery disease   . Depression   . Diabetes mellitus   . History of cocaine abuse   . Hypertension   . Presence of permanent cardiac pacemaker   . Shortness of breath dyspnea   . Suicidal ideation   . Tobacco abuse     Past Surgical History:  Procedure Laterality Date  . CARDIAC CATHETERIZATION N/A 02/12/2016   Procedure: Right/Left Heart Cath and Coronary/Graft Angiography;  Surgeon: Jolaine Artist, MD;  Location: [place] [facility];  Service: Cardiovascular;  Laterality: N/A;  . CARDIAC CATHETERIZATION N/A 03/22/2016   Procedure: Right Heart Cath;  Surgeon: Jolaine Artist, MD;  Location: [place] [facility];  Service: Cardiovascular;  Laterality: N/A;  . CARDIAC CATHETERIZATION N/A 08/25/2016   Procedure: Right Heart Cath;  Surgeon: Jolaine Artist, MD;  Location: [place] [facility];  Service: Cardiovascular;  Laterality: N/A;  . CARDIAC CATHETERIZATION N/A 09/03/2016   Procedure: Right Heart Cath;  Surgeon: Jolaine Artist, MD;  Location: [place] [facility];  Service: Cardiovascular;  Laterality: N/A;  . CARDIAC DEFIBRILLATOR PLACEMENT    . CARDIAC DEFIBRILLATOR PLACEMENT    . CHOLECYSTECTOMY N/A 08/30/2015   Procedure: LAPAROSCOPIC CHOLECYSTECTOMY;  Surgeon: Hubbard Robinson, MD;  Location: [name];  Service:  General;  Laterality: N/A;  . COLONOSCOPY WITH PROPOFOL N/A 08/18/2016   Procedure: COLONOSCOPY WITH PROPOFOL;  Surgeon: Jerene Bears, MD;  Location: [place];  Service: Gastroenterology;  Laterality: N/A;  . CORONARY ARTERY BYPASS GRAFT  2001  . INSERT / REPLACE / REMOVE PACEMAKER    . INSERTION OF IMPLANTABLE LEFT VENTRICULAR ASSIST DEVICE N/A 09/09/2016   Procedure: INSERTION OF IMPLANTABLE LEFT VENTRICULAR ASSIST DEVICE;  Surgeon: [name], MD;  Location: [place];  Service: Open Heart Surgery;  Laterality: N/A;  HeartMate 3 CIRC ARREST  NITRIC OXIDE  . IR FLUORO GUIDE CV LINE RIGHT  01/28/2017  . IR GENERIC HISTORICAL  08/03/2016   IR FLUORO GUIDE CV LINE RIGHT 08/03/2016 [name], MD [facility]  . IR GENERIC HISTORICAL  12/14/2016   IR FLUORO GUIDE CV LINE RIGHT 12/14/2016 [name], MD [facility]  . IR US GUIDE VASC ACCESS RIGHT  01/28/2017  . LUMBAR DISC SURGERY  02/1999   Discectomy and fusion  . RIGHT HEART CATH N/A 02/11/2017   Procedure: Right Heart Cath;  Surgeon: Jolaine Artist, MD;  Location: [place] [facility];  Service: Cardiovascular;  Laterality: N/A;  . TEE WITHOUT CARDIOVERSION N/A 09/09/2016   Procedure: TRANSESOPHAGEAL ECHOCARDIOGRAM (TEE);  Surgeon: [name], MD;  Location: [place];  Service: Open Heart Surgery;  Laterality: N/A;  . VASECTOMY     Subsequent reversal    Prior to Admission medications   Medication Sig Start Date End Date Taking? Authorizing Provider  amiodarone (  PACERONE) 200 MG tablet Take 1 tablet (200 mg total) by mouth daily. 01/12/17  Yes Bensimhon, Shaune Pascal, MD  aspirin EC 81 MG EC tablet Take 1 tablet (81 mg total) by mouth daily. 12/07/16  Yes Clegg, Amy D, NP  citalopram (CELEXA) 20 MG tablet Take 1 tablet (20 mg total) by mouth daily. 04/16/17  Yes Clegg, Amy D, NP  clonazepam (KLONOPIN) 0.25 MG disintegrating tablet Take 1 tablet (0.25 mg total) by mouth 2 (two) times daily. 04/15/17  Yes Clegg, Amy D, NP   digoxin (LANOXIN) 0.25 MG tablet Take 0.25 mg by mouth daily.  02/10/17  Yes [provider]  DOBUTamine (DOBUTREX) 4-5 MG/ML-% infusion Inject 571.8 mcg/min into the vein continuous. PER [facility] 04/15/17  Yes Clegg, Amy D, NP  docusate sodium (COLACE) 100 MG capsule Take 200 mg by mouth at bedtime.    Yes [provider]  gabapentin (NEURONTIN) 100 MG capsule Take 2 capsules (200 mg total) by mouth 2 (two) times daily. 01/12/17  Yes Bensimhon, Shaune Pascal, MD  insulin aspart (NOVOLOG FLEXPEN) 100 UNIT/ML FlexPen Inject 10 Units into the skin 3 (three) times daily with meals. 12/16/16  Yes Clegg, Amy D, NP  insulin detemir (LEVEMIR) 100 UNIT/ML injection Inject 20 Units into the skin at bedtime.   Yes [provider]  Insulin Glargine (LANTUS SOLOSTAR) 100 UNIT/ML Solostar Pen Inject 20 Units into the skin daily at 10 pm.   Yes [provider]  KLOR-CON M20 20 MEQ tablet Take 40 mEq by mouth 3 (three) times daily.  04/27/17  Yes [provider]  losartan (COZAAR) 25 MG tablet Take 1 tablet (25 mg total) by mouth daily. 04/22/17 07/21/17 Yes Clegg, Amy D, NP  magnesium oxide (MAG-OX) 400 (241.3 Mg) MG tablet Take 1 tablet (400 mg total) by mouth daily. 12/07/16  Yes Clegg, Amy D, NP  Multiple Vitamin (MULTIVITAMIN WITH MINERALS) TABS tablet Take 1 tablet by mouth daily.   Yes [provider]  ondansetron (ZOFRAN ODT) 4 MG disintegrating tablet Take 1 tablet (4 mg total) by mouth every 8 (eight) hours as needed for nausea. 04/10/17  Yes Tanna Furry, MD  pantoprazole (PROTONIX) 40 MG tablet Take 1 tablet (40 mg total) by mouth daily. 09/25/16  Yes Isaiah Serge, NP  senna-docusate (SENOKOT S) 8.6-50 MG tablet Take 2 tablets by mouth daily.   Yes [provider]  sildenafil (REVATIO) 20 MG tablet Take 2 tablets (40 mg total) by mouth 3 (three) times daily. Patient taking differently: Take 20 mg by mouth 3 (three) times daily.  02/11/17  Yes  Bensimhon, Shaune Pascal, MD  spironolactone (ALDACTONE) 25 MG tablet TAKE 1 TABLET ONE TIME DAILY Patient taking differently: Take 25 mg by mouth daily 02/10/17  Yes Bensimhon, Shaune Pascal, MD  torsemide (DEMADEX) 20 MG tablet Take 5 tablets (100 mg total) by mouth 2 (two) times daily. 04/22/17  Yes Clegg, Amy D, NP  traMADol (ULTRAM) 50 MG tablet Take 1 tablet (50 mg total) by mouth 3 (three) times daily as needed for severe pain. 04/15/17  Yes Clegg, Amy D, NP  warfarin (COUMADIN) 5 MG tablet Take 10-15 mg by mouth See admin instructions. Take 3 tablets (15 mg) on Tues, Thurs, Sat Take 2 tablets (10 mg) on Mon, Wed, Fri, Sun   Yes [provider]  zolpidem (AMBIEN) 10 MG tablet Take 10 mg by mouth at bedtime as needed for sleep.   Yes [provider]  Potassium Chloride  ER 20 MEQ TBCR Take 40 mEq by mouth 3 (three) times daily. Take extra 20 meq on Monday and Friday. 04/22/17   Conrad [place], NP    Current Facility-Administered Medications  Medication Dose Route Frequency Provider Last Rate Last Dose  . 0.9 %  sodium chloride infusion   Intravenous Continuous Howell Groesbeck V, MD      . Doug Sou Hold] acetaminophen (TYLENOL) tablet 650 mg  650 mg Oral Q4H PRN [name] Friar, PA-C      . [MAR Hold] amiodarone (PACERONE) tablet 200 mg  200 mg Oral Daily [name] Friar, PA-C   200 mg at 05/01/17 0856  . [MAR Hold] citalopram (CELEXA) tablet 20 mg  20 mg Oral Daily [name] Friar, PA-C   20 mg at 05/01/17 0857  . [MAR Hold] clonazepam (KLONOPIN) disintegrating tablet 0.25 mg  0.25 mg Oral BID PRN [name] Friar, PA-C   0.25 mg at 05/02/17 0038  . [MAR Hold] digoxin (LANOXIN) tablet 0.125 mg  0.125 mg Oral Daily Bensimhon, Shaune Pascal, MD      . DOBUTamine (DOBUTREX) infusion 4000 mcg/mL  6 mcg/kg/min Intravenous Continuous [name] Friar, PA-C 8.6 mL/hr at 05/02/17 0823 6 mcg/kg/min at 05/02/17 0823  . [MAR Hold] docusate sodium (COLACE) capsule 200 mg   200 mg Oral BID [name] Friar, PA-C   200 mg at 05/01/17 2118  . [MAR Hold] furosemide (LASIX) injection 80 mg  80 mg Intravenous BID [name] Friar, PA-C   80 mg at 05/01/17 1800  . [MAR Hold] gabapentin (NEURONTIN) capsule 200 mg  200 mg Oral BID [name] Friar, PA-C   200 mg at 05/01/17 2118  . [MAR Hold] insulin aspart (novoLOG) injection 0-15 Units  0-15 Units Subcutaneous TID WC [name] Friar, PA-C   2 Units at 05/01/17 1700  . [MAR Hold] insulin aspart (novoLOG) injection 0-5 Units  0-5 Units Subcutaneous QHS [name] Friar, PA-C      . [MAR Hold] insulin glargine (LANTUS) injection 20 Units  20 Units Subcutaneous QHS [name] Friar, PA-C   20 Units at 05/01/17 2118  . [MAR Hold] losartan (COZAAR) tablet 25 mg  25 mg Oral Daily [name] Friar, PA-C   25 mg at 05/01/17 0856  . [MAR Hold] magnesium oxide (MAG-OX) tablet 400 mg  400 mg Oral Daily [name] Friar, PA-C   400 mg at 05/01/17 0856  . [MAR Hold] metolazone (ZAROXOLYN) tablet 5 mg  5 mg Oral Daily Bensimhon, Shaune Pascal, MD   5 mg at 05/01/17 1253  . [MAR Hold] ondansetron (ZOFRAN) injection 4 mg  4 mg Intravenous Q6H PRN [name] Friar, PA-C      . [MAR Hold] ondansetron (ZOFRAN-ODT) disintegrating tablet 4 mg  4 mg Oral Q8H PRN [name] Friar, PA-C   4 mg at 05/02/17 0038  . [MAR Hold] oxymetazoline (AFRIN) 0.05 % nasal spray 1 spray  1 spray Each Nare BID PRN Means, Lolita Cram, MD   1 spray at 05/01/17 0024  . [MAR Hold] pantoprazole (PROTONIX) EC tablet 40 mg  40 mg Oral BID AC Nelida Meuse III, MD   40 mg at 05/01/17 1700  . [MAR Hold] potassium chloride (K-DUR,KLOR-CON) CR tablet 40 mEq  40 mEq Oral Daily Bensimhon, Shaune Pascal, MD   40 mEq at 05/01/17 1253  . [MAR Hold] senna-docusate (Senokot-S) tablet 2 tablet  2 tablet Oral Daily [name] Friar, PA-C   2 tablet at 05/01/17  4536  Doug Sou Hold] sildenafil (REVATIO) tablet 40 mg   40 mg Oral TID [name] Friar, PA-C   40 mg at 05/01/17 2117  . [MAR Hold] traMADol (ULTRAM) tablet 50 mg  50 mg Oral TID PRN [name] Friar, PA-C      . [MAR Hold] zolpidem (AMBIEN) tablet 10 mg  10 mg Oral QHS PRN [name] Friar, PA-C   10 mg at 05/01/17 2117    Allergies as of 04/29/2017 - Review Complete 04/29/2017  Allergen Reaction Noted  . Codeine Nausea And Vomiting and Other (See Comments) 02/04/2015  . Trazodone and nefazodone Other (See Comments) 12/29/2016  . Lipitor [atorvastatin] Nausea Only and Other (See Comments) 06/20/2016  . Tape Other (See Comments) 12/15/2016    Family History  Problem Relation Age of Onset  . CAD Father   . Diabetes Father   . Heart failure Father     Social History   Social History  . Marital status: Divorced    Spouse name: N/A  . Number of children: 1  . Years of education: 59   Occupational History  . Retired   . Truck driver    Social History Main Topics  . Smoking status: Former Smoker    Packs/day: 1.50    Years: 23.00    Types: Cigarettes    Start date: 10/24/1992    Quit date: 07/01/2016  . Smokeless tobacco: Never Used  . Alcohol use No  . Drug use: Yes    Frequency: 0.1 times per week    Types: Cocaine     Comment: 07/01/2016 last use relapse after 8 yrs  . Sexual activity: Not Currently   Other Topics Concern  . Not on file   Social History Narrative   Divorced with one child   No regular exercise    Review of Systems: All other review of systems negative except as mentioned in the HPI.  Physical Exam: Vital signs in last 24 hours: Temp:  [97.4 F (36.3 C)-98.4 F (36.9 C)] 98.4 F (36.9 C) (07/30 0804) Pulse Rate:  [41-71] 41 (07/30 0804) Resp:  [12-22] 21 (07/30 0804) BP: (70-113)/(41-79) 82/41 (07/30 0804) SpO2:  [93 %-98 %] 95 % (07/30 0315) Weight:  [224 lb (101.6 kg)] 224 lb (101.6 kg) (07/30 0650) Last BM Date: 05/01/17 General:   Alert,  pleasant and cooperative in  NAD Lungs:  Clear throughout to auscultation.   Heart:  mechanical click and heart sounds with LVAD Abdomen:  Soft, nontender and nondistended. Normal bowel sounds.   Neuro/Psych:  Alert and cooperative. Normal mood and affect. A and O x 3   @K .Denzil Magnuson, MD [phone] Mon-Fri 8a-5p [phone] after 5p, weekends, holidays 05/02/2017 9:17 AM@

## 2017-05-02 NOTE — Anesthesia Postprocedure Evaluation (Signed)
Anesthesia Post Note  Patient: Ronald Miller.  Procedure(s) Performed: Procedure(s) (LRB): ENTEROSCOPY (N/A)     Patient location during evaluation: Endoscopy Anesthesia Type: MAC Level of consciousness: awake, awake and alert and oriented Pain management: pain level controlled Vital Signs Assessment: post-procedure vital signs reviewed and stable Respiratory status: spontaneous breathing, nonlabored ventilation and respiratory function stable Cardiovascular status: blood pressure returned to baseline Anesthetic complications: no    Last Vitals:  Vitals:   05/02/17 1053 05/02/17 1259  BP: 96/62 91/75  Pulse: 75 74  Resp: 19 17  Temp:  36.7 C    Last Pain:  Vitals:   05/02/17 1259  TempSrc: Oral  PainSc:                  Elo Marmolejos COKER

## 2017-05-02 NOTE — Anesthesia Preprocedure Evaluation (Signed)
Anesthesia Evaluation  Patient identified by MRN, date of birth, ID band Patient awake    Reviewed: Allergy & Precautions, NPO status , Patient's Chart, lab work & pertinent test results  Airway Mallampati: III  TM Distance: >3 FB     Dental  (+) Poor Dentition, Teeth Intact   Pulmonary former smoker,     + decreased breath sounds      Cardiovascular hypertension,  Rhythm:Regular Rate:Normal  Mechanical hum heard over precordium   Neuro/Psych    GI/Hepatic   Endo/Other  diabetes  Renal/GU      Musculoskeletal   Abdominal   Peds  Hematology   Anesthesia Other Findings   Reproductive/Obstetrics                             Anesthesia Physical Anesthesia Plan  ASA: III  Anesthesia Plan: MAC   Post-op Pain Management:    Induction: Intravenous  PONV Risk Score and Plan:   Airway Management Planned: Natural Airway and Nasal Cannula  Additional Equipment:   Intra-op Plan:   Post-operative Plan:   Informed Consent: I have reviewed the patients History and Physical, chart, labs and discussed the procedure including the risks, benefits and alternatives for the proposed anesthesia with the patient or authorized representative who has indicated his/her understanding and acceptance.   Dental advisory given  Plan Discussed with: CRNA and Anesthesiologist  Anesthesia Plan Comments:         Anesthesia Quick Evaluation

## 2017-05-03 ENCOUNTER — Encounter (HOSPITAL_COMMUNITY): Payer: Self-pay | Admitting: Gastroenterology

## 2017-05-03 DIAGNOSIS — K5900 Constipation, unspecified: Secondary | ICD-10-CM

## 2017-05-03 DIAGNOSIS — K254 Chronic or unspecified gastric ulcer with hemorrhage: Principal | ICD-10-CM

## 2017-05-03 DIAGNOSIS — D508 Other iron deficiency anemias: Secondary | ICD-10-CM

## 2017-05-03 LAB — BASIC METABOLIC PANEL
Anion gap: 9 (ref 5–15)
BUN: 17 mg/dL (ref 6–20)
CALCIUM: 8.4 mg/dL — AB (ref 8.9–10.3)
CO2: 32 mmol/L (ref 22–32)
CREATININE: 1.18 mg/dL (ref 0.61–1.24)
Chloride: 86 mmol/L — ABNORMAL LOW (ref 101–111)
GLUCOSE: 127 mg/dL — AB (ref 65–99)
Potassium: 3.1 mmol/L — ABNORMAL LOW (ref 3.5–5.1)
Sodium: 127 mmol/L — ABNORMAL LOW (ref 135–145)

## 2017-05-03 LAB — CBC
HEMATOCRIT: 27.4 % — AB (ref 39.0–52.0)
Hemoglobin: 8.6 g/dL — ABNORMAL LOW (ref 13.0–17.0)
MCH: 24.8 pg — ABNORMAL LOW (ref 26.0–34.0)
MCHC: 31.4 g/dL (ref 30.0–36.0)
MCV: 79 fL (ref 78.0–100.0)
PLATELETS: 214 10*3/uL (ref 150–400)
RBC: 3.47 MIL/uL — ABNORMAL LOW (ref 4.22–5.81)
RDW: 19.4 % — ABNORMAL HIGH (ref 11.5–15.5)
WBC: 6.7 10*3/uL (ref 4.0–10.5)

## 2017-05-03 LAB — GLUCOSE, CAPILLARY
GLUCOSE-CAPILLARY: 118 mg/dL — AB (ref 65–99)
GLUCOSE-CAPILLARY: 163 mg/dL — AB (ref 65–99)
GLUCOSE-CAPILLARY: 175 mg/dL — AB (ref 65–99)
Glucose-Capillary: 139 mg/dL — ABNORMAL HIGH (ref 65–99)

## 2017-05-03 LAB — PROTIME-INR
INR: 1.47
Prothrombin Time: 18 seconds — ABNORMAL HIGH (ref 11.4–15.2)

## 2017-05-03 LAB — HEPARIN LEVEL (UNFRACTIONATED)
Heparin Unfractionated: 1.28 IU/mL — ABNORMAL HIGH (ref 0.30–0.70)
Heparin Unfractionated: 0.16 IU/mL — ABNORMAL LOW (ref 0.30–0.70)
Heparin Unfractionated: 0.25 IU/mL — ABNORMAL LOW (ref 0.30–0.70)

## 2017-05-03 LAB — LACTATE DEHYDROGENASE: LDH: 167 U/L (ref 98–192)

## 2017-05-03 MED ORDER — POTASSIUM CHLORIDE CRYS ER 20 MEQ PO TBCR
40.0000 meq | EXTENDED_RELEASE_TABLET | Freq: Once | ORAL | Status: AC
Start: 2017-05-03 — End: 2017-05-03
  Administered 2017-05-03: 40 meq via ORAL
  Filled 2017-05-03: qty 2

## 2017-05-03 MED ORDER — POLYETHYLENE GLYCOL 3350 17 G PO PACK
17.0000 g | PACK | Freq: Every day | ORAL | Status: DC
  Administered 2017-05-03: 17 g via ORAL
  Filled 2017-05-03 (×7): qty 1

## 2017-05-03 MED ORDER — SPIRONOLACTONE 25 MG PO TABS
25.0000 mg | ORAL_TABLET | Freq: Every day | ORAL | Status: DC
  Administered 2017-05-03 – 2017-05-09 (×7): 25 mg via ORAL
  Filled 2017-05-03 (×6): qty 1

## 2017-05-03 MED ORDER — WARFARIN SODIUM 7.5 MG PO TABS
15.0000 mg | ORAL_TABLET | Freq: Once | ORAL | Status: AC
Start: 2017-05-03 — End: 2017-05-03
  Administered 2017-05-03: 15 mg via ORAL
  Filled 2017-05-03: qty 2

## 2017-05-03 NOTE — Progress Notes (Signed)
ANTICOAGULATION CONSULT NOTE - Follow Up Consult  Pharmacy Consult for heparin Indication: atrial fibrillation  Labs:  Recent Labs  05/01/17 0538 05/02/17 0431 05/02/17 1828 05/03/17 0330 05/03/17 0609  HGB 8.2* 8.7*  --  8.6*  --   HCT 26.9* 27.9*  --  27.4*  --   PLT 167 195  --  214  --   LABPROT 26.1* 19.1*  --  18.0*  --   INR 2.35 1.59  --  1.47  --   HEPARINUNFRC  --   --  0.10* 1.28* 0.16*  CREATININE 1.23 1.17  --  1.18  --     Assessment: 61yo male remains subtherapeutic on heparin with small change in level after rate change (initial lab this morning was drawn from PICC, redrew peripherally); pt experienced bleeding from nose and mouth overnight but currently controlled, CBC stable.  Goal of Therapy:  Heparin level 0.3-0.5 units/ml   Plan:  Will increase heparin gtt by 2 units/kg/hr to 1600 units/hr and check level in 6hr.  Wynona Neat, PharmD, BCPS  05/03/2017,7:21 AM

## 2017-05-03 NOTE — Progress Notes (Signed)
ANTICOAGULATION CONSULT NOTE - Follow Up Consult  Pharmacy Consult for Heparin > warfarin Indication: atrial fibrillation  Allergies  Allergen Reactions  . Codeine Nausea And Vomiting and Other (See Comments)    In high doses  . Trazodone And Nefazodone Other (See Comments)    Dizziness with subsequent fall   . Lipitor [Atorvastatin] Nausea Only and Other (See Comments)    Nausea with high doses, tolerates 20mg  dose (08/22/16)  . Tape Other (See Comments)    Paper tape is ok    Patient Measurements: Heparin Dosing Weight: 81.5 kg  Vital Signs: Temp: 98.5 F (36.9 C) (07/31 1100) Temp Source: Axillary (07/31 0748) BP: 93/73 (07/31 1255) Pulse Rate: 71 (07/31 1500)  Labs:  Recent Labs  05/01/17 0538 05/02/17 0431  05/03/17 0330 05/03/17 0609 05/03/17 1438  HGB 8.2* 8.7*  --  8.6*  --   --   HCT 26.9* 27.9*  --  27.4*  --   --   PLT 167 195  --  214  --   --   LABPROT 26.1* 19.1*  --  18.0*  --   --   INR 2.35 1.59  --  1.47  --   --   HEPARINUNFRC  --   --   < > 1.28* 0.16* 0.25*  CREATININE 1.23 1.17  --  1.18  --   --   < > = values in this interval not displayed.  Estimated Creatinine Clearance: 70 mL/min (by C-G formula based on SCr of 1.18 mg/dL).   Medical History: Past Medical History:  Diagnosis Date  . AICD (automatic cardioverter/defibrillator) present   . ASCVD (arteriosclerotic cardiovascular disease)   . Chronic systolic CHF (congestive heart failure) ([place])   . COPD (chronic obstructive pulmonary disease) ([place])   . Coronary artery disease   . Depression   . Diabetes mellitus   . History of cocaine abuse   . Hypertension   . Presence of permanent cardiac pacemaker   . Shortness of breath dyspnea   . Suicidal ideation   . Tobacco abuse     Assessment: 61 yoM with end-stage heart failure (LVAD placement in 09/2016) on outpatient Dobutamione drip 60mcg/kg/min  for RV failure coox 60s -  with digoxin 0.25 level 1.1 dose dropped 0.16mcg qd, and  sildenafil 40tid, Losartan 25  MAPs stable 70-80s  PAF on amio now SR  On coumadin PTA 15mg  TTS/10mg  MWFSu admit INR 3.7 -  admitted with HGB drop and melena> s/p 2 uts PRBC slight increase in h/h post transfusion.  Held warfarin for GI work up> s/p EGD with ulcerative changes but not active bleeding> begin sucralfate and ppi po and ok to restart AC  LDH stable 150s Heparin drip 1600 units/hr  Heparin level slightly < goal 0.26 but patient with nose bleeds today - will not increase heparin drip rate INR 1.47 s/p  Warfarin 10mg  last pm - will increase dose tonight  Goal of Therapy:  Heparin level 0.3-0.5 INR 2-2.5 Monitor platelets by anticoagulation protocol: Yes   Plan:  Warfarin 15mg  x1 Continue Heparin drip 1600 uts/hr  HL, Protime, CBC daily  Bonnita Nasuti Pharm.D. CPP, BCPS Clinical Pharmacist [phone] 05/03/2017 4:21 PM

## 2017-05-03 NOTE — Care Management Note (Addendum)
Case Management Note  Patient Details  Name: Ronald Miller. MRN: [id number] Date of Birth: April 02, 1956  Subjective/Objective:   Pt admitted with GI bleed - hx of LVAD                 Action/Plan:   PTA at SNF - however pt refuses to return to SNF environment and plans to discharge to his sisters.  Pt will discharge home on IV dobutamine (has had med in the home previously with [facility]).  [facility] following and have accepted referral including home HF orders.  CM will continue to follow for discharge needs   Expected Discharge Date:  05/06/17               Expected Discharge Plan:  [place]  In-House Referral:     Discharge planning Services  CM Consult  Post Acute Care Choice:    Choice offered to:  Patient  DME Arranged:  IV pump/equipment DME Agency:  [place]:  RN [facility] Agency:  [place]  Status of Service:  In process, will continue to follow  If discussed at Long Length of Stay Meetings, dates discussed:    Additional Comments: Pt is independently ambulatory in the room per bedside nurse.  Pt denied barriers with discharging home with [name] 05/03/2017, 8:54 AM

## 2017-05-03 NOTE — Progress Notes (Signed)
Advanced Heart Failure VAD Team Note  Subjective:    He has received 2u PRBCs. Hgb 7.6-> 8.9. Had enteroscopy 7/31 with ulcers in his stomach. Started on carafate. Back on heparin and coumadin.   Continues to diurese with IV lasix + metolazone. Weight down 3 pounds. CVP trending down 10-11. Earlier this morning had nose bleed that resolved.   Complaining of fatigue.     LDH 167. INR 1.47   LVAD INTERROGATION:  HeartMate II LVAD:  Flow 5.6 liters/min, speed 5950, power .7 PI 3.1 Over 30 PI events.   Objective:    Vital Signs:   Temp:  [97 F (36.1 C)-98 F (36.7 C)] 97 F (36.1 C) (07/31 0748) Pulse Rate:  [74-117] 117 (07/31 0748) Resp:  [15-20] 20 (07/31 0748) BP: (83-122)/(54-92) 105/92 (07/31 0829) SpO2:  [93 %-97 %] 97 % (07/30 1955) Weight:  [221 lb 6.4 oz (100.4 kg)] 221 lb 6.4 oz (100.4 kg) (07/31 0600) Last BM Date: 05/02/17 Mean arterial Pressure 70-80s   Intake/Output:   Intake/Output Summary (Last 24 hours) at 05/03/17 0837 Last data filed at 05/02/17 2200  Gross per 24 hour  Intake           922.55 ml  Output             2000 ml  Net         -1077.45 ml     Physical Exam  CVP 10-11 Physical Exam: GENERAL: Well appearing, male who presents to clinic today in no acute distress. HEENT: normal  NECK: Supple, JVP ~10 .  2+ bilaterally, no bruits.  No lymphadenopathy or thyromegaly appreciated.   CARDIAC:  Mechanical heart sounds with LVAD hum present. R upper chest tunneled cath LUNGS:  Clear to auscultation bilaterally.  ABDOMEN:  Soft, round, nontender, positive bowel sounds x4.     LVAD exit site: well-healed and incorporated.  Dressing dry and intact.  No erythema or drainage.  Stabilization device present and accurately applied.  Driveline dressing is being changed daily per sterile technique. EXTREMITIES:  Warm and dry, no cyanosis, clubbing, rash or edema  NEUROLOGIC:  Alert and oriented x 4.  Gait steady.  No aphasia.  No dysarthria.  Affect  pleasant.       Telemetry   NSR personally reviewed.     EKG    N/A  Labs   Basic Metabolic Panel:  Recent Labs Lab 04/29/17 1147 04/30/17 0415 05/01/17 0538 05/02/17 0431 05/03/17 0330  NA 128* 130* 130* 128* 127*  K 5.2* 3.9 3.8 3.3* 3.1*  CL 92* 93* 92* 87* 86*  CO2 27 31 31  32 32  GLUCOSE 205* 157* 167* 173* 127*  BUN 28* 27* 24* 20 17  CREATININE 1.44* 1.37* 1.23 1.17 1.18  CALCIUM 8.7* 8.3* 8.5* 8.3* 8.4*    Liver Function Tests: No results for input(s): AST, ALT, ALKPHOS, BILITOT, PROT, ALBUMIN in the last 168 hours. No results for input(s): LIPASE, AMYLASE in the last 168 hours. No results for input(s): AMMONIA in the last 168 hours.  CBC:  Recent Labs Lab 04/29/17 1147 04/30/17 0415 04/30/17 0605 05/01/17 0538 05/02/17 0431 05/03/17 0330  WBC 7.1 6.1  --  6.1 6.9 6.7  HGB 7.6* 8.7* 8.9* 8.2* 8.7* 8.6*  HCT 24.8* 27.7* 29.1* 26.9* 27.9* 27.4*  MCV 76.5* 78.9  --  79.4 79.0 79.0  PLT 212 189  --  167 195 214    INR:  Recent Labs Lab 04/29/17 1147 04/30/17 0415 05/01/17 6270  05/02/17 0431 05/03/17 0330  INR 3.70 3.01 2.35 1.59 1.47    Other results:     Imaging   No results found.   Medications:     Scheduled Medications: . amiodarone  200 mg Oral Daily  . citalopram  20 mg Oral Daily  . digoxin  0.125 mg Oral Daily  . docusate sodium  200 mg Oral BID  . furosemide  80 mg Intravenous BID  . gabapentin  200 mg Oral BID  . insulin aspart  0-15 Units Subcutaneous TID WC  . insulin aspart  0-5 Units Subcutaneous QHS  . insulin glargine  20 Units Subcutaneous QHS  . losartan  25 mg Oral Daily  . magnesium oxide  400 mg Oral Daily  . metolazone  5 mg Oral Daily  . pantoprazole  40 mg Oral BID AC  . potassium chloride  40 mEq Oral BID  . senna-docusate  2 tablet Oral Daily  . sildenafil  40 mg Oral TID  . spironolactone  12.5 mg Oral Daily  . sucralfate  1 g Oral TID WC & HS  . Warfarin - Pharmacist Dosing Inpatient    Does not apply q1800    Infusions: . sodium chloride 20 mL/hr at 05/02/17 1652  . DOBUTamine 6 mcg/kg/min (05/02/17 1652)  . heparin 1,600 Units/hr (05/03/17 0829)    PRN Medications: acetaminophen, clonazePAM, ondansetron (ZOFRAN) IV, ondansetron, oxymetazoline, traMADol, zolpidem   Patient Profile   HERLEY BERNARDINI Jr.is a 61 y.o.male with history of chronic systolic HF s/p [employer] ICD 2014, CAD s/p CABG in 2000, HTN, Hx of cocaine abuse, Tobacco abuse, Depression, PAF, and COPD. HM3 placed 09/09/2016.   Presented to clinic 04/29/17 for follow up. Hgb down, INR up. Reports dark black stools and constipation. Admitted for treatment and further evaluation.    Assessment/Plan:    1. Acute on chronic anemia with Melena - likely acute upper GI bleed; IDA - 1 week of black stools with symptoms and drop in Hgb.- Iron low. Received Feraheme on 7/28  - Now s/p 2u RBCs. Hgb with suboptimal bump. Hgb down again today. Will transfuse further if Hgb < 8.0.  - Had scope with ulcers in stomach. Continue carafate and continue PPI twice a day.  - No aspirin.  Hgb 8.6    2. Acute/Chronic end-stage biventricular systolic TO:IZTI 45% due to ICM ->s/p Echo 08/24/16 LVEF 15%, RV mild dilated, moderately reduced. -->S/P HM3 LVAD 80/06/9832.  - Complicated by RV failure. Initially on milrinone but stopped due to low mixed venous saturation, Remains on home dobutamine 77mcg/kg/min. Todays CO-OX is stable 69%.   - Continues to struggle with severe RV failure. - On admit weight 233>224>221. CVP 10-11. Continue IV diuresis one more day then transtion to po.  Prior to admit he was on torsemide 60 mg twice a day.  - Continue IV lasix + metolazone. Increase spiro to 25 mg daily. . (prior to admit he was on 25 mg spiro  daily)  -Maps stable. .  -Continue losartan 25 mg daily.  - No beta blocker with RV failure - Digoxin level elevated. Dose was cu back to 0.125 mcg daily. Check dig level again as an  outpatient.   3. CAD: No CP. 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  -No S/S  ischemia  Continue statin. Off ASA due to GIB  4. DMII:  - Continue scheduled insulin and SSI  5. Tobacco/substance abuse:  - Currently not  using  6. Atrial fibrillation, paroxysmal - Maintaining NSR.   7. Anxiety/Depression - Continue current dose of celexa and klonipin.    8. RUE DVT.  - Restart coumadin today.    9. Hyponatremia:  - sodium 127. Restrict free water.   10. CKD stage 3 - creatinine stable 1.18.   11. Hypokalemia- increasing spiro and supp K.    He plans to go to home with his sister. He will need [facility] for home dobutamine 6 mcg. He has used [facility] in the past.  Order placed [facility] .   I reviewed the LVAD parameters from today, and compared the results to the patient's prior recorded data.  No programming changes were made.  The LVAD is functioning within specified parameters.  The patient performs LVAD self-test daily.  LVAD interrogation was negative for any significant power changes, alarms or PI events/speed drops.  LVAD equipment check completed and is in good working order.  Back-up equipment present.   LVAD education done on emergency procedures and precautions and reviewed exit site care.   Length of Stay: 4  Darrick Grinder, NP 05/03/2017, 8:37 AM  VAD Team --- VAD ISSUES ONLY--- Pager [phone] (7am - 7am)  Advanced Heart Failure Team  Pager [phone] (M-F; 7a - 4p)  Please contact [facility] for night-coverage after hours (4p -7a ) and weekends on [email or web address]   Patient seen and examined with Darrick Grinder, NP. We discussed all aspects of the encounter. I agree with the assessment and plan as stated above.   Remains volume overloaded in setting of severe RV failure. Improving with dobutamine and IV diuresis. Would continue IV diuresis at least one more day.   Results of EGD reviewed with him. Tolerating heparin. Hgb stable. Remains on PPI and  carfate. Await H pylori and gastric biopsy results. Continue heparin. Restart warfarin. D/w PharmD   Epistaxis resolved.   VAD parameters stable. Continue heparin until INR 1.8 or greater.   Glori Bickers, MD  12:33 PM

## 2017-05-03 NOTE — Plan of Care (Signed)
Problem: Bowel/Gastric: Goal: Will not experience complications related to bowel motility Outcome: Progressing Had stool after endo  Problem: Cardiac: Goal: Ability to maintain an adequate cardiac output will improve Outcome: Progressing No change in flow from LVAD  Problem: Fluid Volume: Goal: Risk for excess fluid volume will decrease Outcome: Progressing Weight decreased this AM  Problem: Bowel/Gastric: Goal: Will show no signs and symptoms of gastrointestinal bleeding Outcome: Not Progressing Had 2 bouts of bleeding via nose and mouth, small amounts.   Comments: Patient concerned about discharge and where he will be. Currently will be staying at sisters house. Had 2 episodes of bleeding, heperain fractionation done via peripheral stick.

## 2017-05-03 NOTE — Progress Notes (Addendum)
Patient awoke to a sneeze and nose bleed from left nares. Applied pressure, cleaned, gauze packed in nose by patient.  Bleeding decreasing. Gown changed. Labs sent to check heparin levels and routine labs

## 2017-05-03 NOTE — Progress Notes (Signed)
Patient having frequent nosebleeds, paged VAD coordinator to update. Will continue to monitor.

## 2017-05-03 NOTE — Progress Notes (Signed)
Received page from pts nurse stating the pts nose/mouth is bleeding. Instructed to hold pressure and call back in 1 hour if nosebleed persists.

## 2017-05-03 NOTE — Progress Notes (Signed)
[facility] Progress Note  Chief Complaint:   melena   Subjective: Several episodes of blood in mouth during the night. No vomiting. Nursing staff documented nosebleed around 3 am. Black stool yesterday, no BMs yet today. He has some lower abdominal discomfort.   Objective:  Vital signs in last 24 hours: Temp:  [97 F (36.1 C)-98 F (36.7 C)] 97 F (36.1 C) (07/31 0748) Pulse Rate:  [74-117] 117 (07/31 0748) Resp:  [15-20] 20 (07/31 0748) BP: (83-122)/(54-92) 105/92 (07/31 0829) SpO2:  [94 %-98 %] 98 % (07/31 0748) Weight:  [221 lb 6.4 oz (100.4 kg)] 221 lb 6.4 oz (100.4 kg) (07/31 0600) Last BM Date: 05/02/17 General:   Alert, well-developed,white male in NAD sitting on edge of bed EENT:  Normal hearing, non icteric sclera, conjunctive pink.  Heart:  LVAD hum.  Pulm: Normal respiratory effort, lungs CTA bilaterally without wheezes or crackles. Abdomen:  Soft, nondistended, nontender.  Normal bowel sounds Neurologic:  Alert and  oriented x4;  grossly normal neurologically. Psych:  Pleasant, cooperative.  Normal mood and affect.   Intake/Output from previous day: 07/30 0701 - 07/31 0700 In: 934.5 [P.O.:462; I.V.:472.5] Out: 2000 [Urine:2000] Intake/Output this shift: Total I/O In: 238.6 [I.V.:238.6] Out: -   Lab Results:  Recent Labs  05/01/17 0538 05/02/17 0431 05/03/17 0330  WBC 6.1 6.9 6.7  HGB 8.2* 8.7* 8.6*  HCT 26.9* 27.9* 27.4*  PLT 167 195 214   BMET  Recent Labs  05/01/17 0538 05/02/17 0431 05/03/17 0330  NA 130* 128* 127*  K 3.8 3.3* 3.1*  CL 92* 87* 86*  CO2 31 32 32  GLUCOSE 167* 173* 127*  BUN 24* 20 17  CREATININE 1.23 1.17 1.18  CALCIUM 8.5* 8.3* 8.4*    PT/INR  Recent Labs  05/02/17 0431 05/03/17 0330  LABPROT 19.1* 18.0*  INR 1.59 1.47   Enteroscopy  Findings:      There was no evidence of significant pathology in the visualized portion       of proximal and mid jejunum.      A single 5 mm submucosal  nodule, lipoma was found in the third portion       of the duodenum.      Otherwise there was no evidence of significant pathology in the entire       examined duodenum.      Localized severe inflammation with hemorrhage characterized by erosions,       erythema, friability, aphthous ulcerations and target ulcerations was       found in the gastric antrum. Biopsies were taken with a cold forceps for       histology. Biopsies were taken with a cold forceps for Helicobacter       pylori testing.      The examined esophagus was normal. Impression:               - The examined portion of the jejunum was normal.                           - Submucosal duodenal lipoma                           - Otherwise examined duodenum appeared normal.                           - Gastritis  with ulceration and hemorrhage.                            Biopsied to exclude H.pylori gastritis                           - Normal esophagus.   ASSESSMENT / PLAN:   1. Upper GI bleed / antrum with ulcerations + hemorrhage found on enteroscopy. Bx pending to rule out H.pylori.  -await gastric biopsies and treat if + h.pylori -continue BID PPI for two months then qam.  -continue carafate  2. Complex cardiac history including PAF on amiodarone, CAD s/p CABG, Cardiomyopathy s/p LVAD placement in December. On chronic coumadin  3. Acute on chronic anemia secondary to #1, s/p 2 units of blood on 7/28. Hgb stable ata 8.6 since transfusion.    Principal Problem:   GI bleed Active Problems:   Acute on chronic systolic (congestive) heart failure (HCC)   Acute upper GI bleed   Iron deficiency anemia     LOS: 4 days   Tye Savoy ,NP 05/03/2017, 10:04 AM  Pager number [phone]    Attending physician's note   I have taken an interval history, reviewed the chart and examined the patient. I agree with the Advanced Practitioner's note, impression and recommendations.  Nose bleed overnight, continues to have  small volume nose bleed. Hgb stable. Restarted on anticoagulation.  No BM C/o abdominal bloating Start Miralax 1 capful daily as needed, titrate to 1-2 BM daily Mild inactive gastritis, negative for H.pylori Will sign off, please call with any questions   K Denzil Magnuson, MD [phone] Mon-Fri 8a-5p [phone] after 5p, weekends, holidays

## 2017-05-03 NOTE — Progress Notes (Signed)
Awoke from resting with bleeding from mouth. Sitting straight up, washed up, CHF team paged to let them know.

## 2017-05-03 NOTE — Progress Notes (Signed)
LVAD Coordinator Rounding Note:  Admitted 7/27/2018due to a possible GI bleed.   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 Prescott Gum.   Vital signs: HR: 98 vpaced Doppler Pressure: 82 Automatic BP: 122/54 O2 Sat: 98 RA Wt: 224>221lbs  LVAD interrogation reveals:  Speed:5900 Flow: 5.7 Power: 4.7 PI: 3.4 Alarms: none Events: 40+ PI events for 730-7/31 Fixed speed: 5900 Low speed limit: 5600 HCT: 27  Drive Line: changed 1/61, will need to be changed again 8/3.  Labs:  LDH trend: 156>167  INR trend: 3.01>2.35>1.59>1.47  HGB trend: 7.6>8.7>8 .9>8.2>8.7>8.6  Anticoagulation Plan: -INR Goal: 2-2.5 -ASA Dose: 81 mg  Adverse Events on VAD: -11/30/16> Milrinone gtt at discharge -01/27/17>dobutamine gtt at discharge  Blood products this admission:  04/30/17- 2 units PC  Gtts: Dobutamine 6 mcg  Pt is very depressed today and expressed great discontent with his nosebleeds last night and not being able to sleep.   Plan/Recommendations:   1. Continue Heparin bridge. 2. Pt states he is planning to be discharged to his sister's house.  3. Continue to support patient's emotional and mental state.   Tanda Rockers RN, VAD Coordinator 24/7 pager [phone]

## 2017-05-03 NOTE — Progress Notes (Signed)
CARDIAC REHAB PHASE I   Pt sitting on edge of bed with gauze in nose due to epistaxis. Pt declines ambulation at this time due to bleeding from nose. Assisted pt to chair, assisted to change gown. Will follow up tomorrow. Pt in recliner, call bell within reach.   [place], RN, BSN 05/03/2017 2:52 PM

## 2017-05-04 ENCOUNTER — Ambulatory Visit: Payer: Self-pay

## 2017-05-04 ENCOUNTER — Other Ambulatory Visit (HOSPITAL_COMMUNITY): Payer: Medicare HMO

## 2017-05-04 LAB — BASIC METABOLIC PANEL
ANION GAP: 6 (ref 5–15)
ANION GAP: 8 (ref 5–15)
BUN: 22 mg/dL — ABNORMAL HIGH (ref 6–20)
BUN: 19 mg/dL (ref 6–20)
CALCIUM: 8.6 mg/dL — AB (ref 8.9–10.3)
CALCIUM: 9 mg/dL (ref 8.9–10.3)
CHLORIDE: 87 mmol/L — AB (ref 101–111)
CO2: 31 mmol/L (ref 22–32)
CO2: 31 mmol/L (ref 22–32)
Chloride: 86 mmol/L — ABNORMAL LOW (ref 101–111)
Creatinine, Ser: 1.23 mg/dL (ref 0.61–1.24)
Creatinine, Ser: 1.21 mg/dL (ref 0.61–1.24)
GFR calc non Af Amer: >60 mL/min (ref >60)
GLUCOSE: 143 mg/dL — AB (ref 65–99)
GLUCOSE: 138 mg/dL — AB (ref 65–99)
Potassium: 3.8 mmol/L (ref 3.5–5.1)
Potassium: 4.1 mmol/L (ref 3.5–5.1)
Sodium: 124 mmol/L — ABNORMAL LOW (ref 135–145)
Sodium: 125 mmol/L — ABNORMAL LOW (ref 135–145)

## 2017-05-04 LAB — LACTATE DEHYDROGENASE: LDH: 158 U/L (ref 98–192)

## 2017-05-04 LAB — CBC
HEMATOCRIT: 28.2 % — AB (ref 39.0–52.0)
HEMOGLOBIN: 8.8 g/dL — AB (ref 13.0–17.0)
MCH: 24.9 pg — ABNORMAL LOW (ref 26.0–34.0)
MCHC: 31.2 g/dL (ref 30.0–36.0)
MCV: 79.7 fL (ref 78.0–100.0)
Platelets: 203 10*3/uL (ref 150–400)
RBC: 3.54 MIL/uL — ABNORMAL LOW (ref 4.22–5.81)
RDW: 20.3 % — AB (ref 11.5–15.5)
WBC: 5.6 10*3/uL (ref 4.0–10.5)

## 2017-05-04 LAB — GLUCOSE, CAPILLARY
GLUCOSE-CAPILLARY: 137 mg/dL — AB (ref 65–99)
Glucose-Capillary: 126 mg/dL — ABNORMAL HIGH (ref 65–99)
Glucose-Capillary: 137 mg/dL — ABNORMAL HIGH (ref 65–99)
Glucose-Capillary: 166 mg/dL — ABNORMAL HIGH (ref 65–99)

## 2017-05-04 LAB — PROTIME-INR
INR: 1.28
Prothrombin Time: 16 seconds — ABNORMAL HIGH (ref 11.4–15.2)

## 2017-05-04 LAB — COOXEMETRY PANEL
Carboxyhemoglobin: 1.8 % — ABNORMAL HIGH (ref 0.5–1.5)
METHEMOGLOBIN: 1.3 % (ref 0.0–1.5)
O2 SAT: 53 %
TOTAL HEMOGLOBIN: 8.7 g/dL — AB (ref 12.0–16.0)

## 2017-05-04 LAB — SODIUM: Sodium: 128 mmol/L — ABNORMAL LOW (ref 135–145)

## 2017-05-04 LAB — HEPARIN LEVEL (UNFRACTIONATED): Heparin Unfractionated: 0.29 IU/mL — ABNORMAL LOW (ref 0.30–0.70)

## 2017-05-04 MED ORDER — TOLVAPTAN 15 MG PO TABS
15.0000 mg | ORAL_TABLET | Freq: Once | ORAL | Status: AC
  Administered 2017-05-04: 15 mg via ORAL
  Filled 2017-05-04: qty 1

## 2017-05-04 MED ORDER — WARFARIN SODIUM 7.5 MG PO TABS
15.0000 mg | ORAL_TABLET | Freq: Once | ORAL | Status: AC
  Administered 2017-05-04: 15 mg via ORAL
  Filled 2017-05-04: qty 2

## 2017-05-04 NOTE — Progress Notes (Signed)
Arrived to patients room to assess nosebleed. Patient has both nostrils packed with tissues and showed me several bloody tissues he had saved in a cup. Patient coughing up mucousy, bloody clots. Darrick Grinder NP arrived to room to assess. Plans made to consult ENT. Patient has previously seen Dr Redmond Baseman for nosebleed after Lovenox use.   Balinda Quails RN, VAD Coordinator 24/7 pager [phone]

## 2017-05-04 NOTE — Progress Notes (Signed)
LVAD Coordinator Rounding Note:  Admitted 7/27/2018due to a possible GI bleed.   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 Prescott Gum.   Vital signs: HR: 98 vpaced Doppler Pressure: 82 Automatic BP: 122/54 O2 Sat: 98 RA Wt: 224>221lbs  LVAD interrogation reveals:  Speed:5900 Flow: 5.6 Power: 5 PI: 3.3 Alarms: none Events: 40+ PI events for 730-7/31, 10-15 overnight Fixed speed: 5900 Low speed limit: 5600 HCT: 27  Drive Line: changed 3/29, will need to be changed again 8/3.  Labs:  LDH trend: [phone]  INR trend: 3.01>2.35>1.59>1.47>1.28   HGB trend: 7.6>8.7>8 .9>8.2>8.7>8.6>8.8  Anticoagulation Plan: -INR Goal: 2-2.5 -ASA Dose: 81 mg  Adverse Events on VAD: -11/30/16> Milrinone gtt at discharge -01/27/17>dobutamine gtt at discharge  Blood products this admission:  04/30/17- 2 units PC  Gtts: Dobutamine 6 mcg   Plan/Recommendations:   1. Continue Heparin bridge. 2. Pt states he is planning to be discharged to his sister's house.    Balinda Quails RN, VAD Coordinator 24/7 pager [phone]

## 2017-05-04 NOTE — Progress Notes (Signed)
ANTICOAGULATION CONSULT NOTE - Follow Up Consult  Pharmacy Consult for Heparin > warfarin Indication: atrial fibrillation  Allergies  Allergen Reactions  . Codeine Nausea And Vomiting and Other (See Comments)    In high doses  . Trazodone And Nefazodone Other (See Comments)    Dizziness with subsequent fall   . Lipitor [Atorvastatin] Nausea Only and Other (See Comments)    Nausea with high doses, tolerates 20mg  dose (08/22/16)  . Tape Other (See Comments)    Paper tape is ok    Patient Measurements: Heparin Dosing Weight: 81.5 kg  Vital Signs: Temp: 98.2 F (36.8 C) (08/01 1144) Temp Source: Oral (08/01 1144) BP: 90/34 (08/01 1144)  Labs:  Recent Labs  05/02/17 0431  05/03/17 0330 05/03/17 0609 05/03/17 1438 05/04/17 0537 05/04/17 0829  HGB 8.7*  --  8.6*  --   --  8.8*  --   HCT 27.9*  --  27.4*  --   --  28.2*  --   PLT 195  --  214  --   --  203  --   LABPROT 19.1*  --  18.0*  --   --  16.0*  --   INR 1.59  --  1.47  --   --  1.28  --   HEPARINUNFRC  --   < > 1.28* 0.16* 0.25* 0.29*  --   CREATININE 1.17  --  1.18  --   --  1.23 1.21  < > = values in this interval not displayed.  Estimated Creatinine Clearance: 68.6 mL/min (by C-G formula based on SCr of 1.21 mg/dL).   Medical History: Past Medical History:  Diagnosis Date  . AICD (automatic cardioverter/defibrillator) present   . ASCVD (arteriosclerotic cardiovascular disease)   . Chronic systolic CHF (congestive heart failure) ([place])   . COPD (chronic obstructive pulmonary disease) ([place])   . Coronary artery disease   . Depression   . Diabetes mellitus   . History of cocaine abuse   . Hypertension   . Presence of permanent cardiac pacemaker   . Shortness of breath dyspnea   . Suicidal ideation   . Tobacco abuse     Assessment: 52 yoM with end-stage heart failure (LVAD placement in 09/2016) on outpatient Dobutamione drip 29mcg/kg/min  for RV failure coox 60s -  with digoxin 0.25 level 1.1 dose  dropped 0.131mcg qd, and sildenafil 40tid, Losartan 25  MAPs stable 70-80s  PAF on amio now SR  On coumadin PTA 15mg  TTS/10mg  MWFSu admit INR 3.7 -  admitted with HGB drop and melena> s/p 2 uts PRBC slight increase in h/h post transfusion.  Held warfarin for GI work up> s/p EGD with ulcerative changes but not active bleeding> begin sucralfate and ppi po and ok to restart Coumadin.  LDH stable 150s Heparin drip 1600 units/hr  Heparin level slightly < goal 0.29 but patient with nose bleeds today - will not increase heparin drip rate. INR down to 1.2 despite increasing warfarin dose, expected after holding for several days.  Goal of Therapy:  Heparin level 0.3-0.5 INR 2-2.5 Monitor platelets by anticoagulation protocol: Yes   Plan:  Warfarin 15 mg x 1 again tonight. Continue Heparin drip 1600 uts/hr  Daily heparin level, CBC and INR.  Uvaldo Rising, BCPS  Clinical Pharmacist Pager [phone]  05/04/2017 1:30 PM

## 2017-05-04 NOTE — Progress Notes (Signed)
Notified Ronald Miller regarding pt having nosebleed x45min continue to apply compress. Will come  And assess

## 2017-05-04 NOTE — Progress Notes (Signed)
Advanced Heart Failure VAD Team Note  Subjective:    He has received 2u PRBCs. Hgb 7.6-> 8.9. Had enteroscopy 7/31 with ulcers in his stomach. Started on carafate. Back on heparin and coumadin.   Continues to diuresed with IV lasix and metolazone. Weight up 2 pounds but he is negative 1.5 liters  Had nose bleed last night. Hgb 8.8 stable. Sodium continues to trend down to 124. Eating ice through out the day.   Denies SOB.      LDH 158. INR 1.28    LVAD INTERROGATION:  HeartMate II LVAD:  Flow 5.6 liters/min, speed 5900, power 5 PI 3.3   Objective:    Vital Signs:   Temp:  [97.8 F (36.6 C)-98.5 F (36.9 C)] 97.8 F (36.6 C) (08/01 0655) Pulse Rate:  [71] 71 (07/31 1500) Resp:  [17-23] 17 (07/31 1500) BP: (82-105)/(45-92) 82/45 (08/01 0655) SpO2:  [94 %] 94 % (07/31 1255) Weight:  [223 lb 4.8 oz (101.3 kg)] 223 lb 4.8 oz (101.3 kg) (08/01 0655) Last BM Date: 05/03/17 Mean arterial Pressure 80-90s   Intake/Output:   Intake/Output Summary (Last 24 hours) at 05/04/17 0814 Last data filed at 05/04/17 0700  Gross per 24 hour  Intake          1032.47 ml  Output             2600 ml  Net         -1567.53 ml     Physical Exam   Physical Exam: CVP 14 GENERAL: NAD in bed.  HEENT: normal  NECK: Supple, JVP  To jaw.  2+ bilaterally, no bruits.  No lymphadenopathy or thyromegaly appreciated.   CARDIAC:  Mechanical heart sounds with LVAD hum present. R upper chest tunneled PICC LUNGS:  Clear to auscultation bilaterally.  ABDOMEN:  Soft, round, nontender, positive bowel sounds x4.     LVAD exit site:  Dressing dry and intact.  No erythema or drainage.  Stabilization device present and accurately applied.  Driveline dressing is being changed daily per sterile technique. EXTREMITIES:  Warm and dry, no cyanosis, clubbing, rash or edema  NEUROLOGIC:  Alert and oriented x 4.  Gait steady.  No aphasia.  No dysarthria.  Affect flat.       Telemetry   NSR personally reviewed.      EKG    N/A  Labs   Basic Metabolic Panel:  Recent Labs Lab 04/30/17 0415 05/01/17 0538 05/02/17 0431 05/03/17 0330 05/04/17 0537  NA 130* 130* 128* 127* 124*  K 3.9 3.8 3.3* 3.1* 3.8  CL 93* 92* 87* 86* 87*  CO2 31 31 32 32 31  GLUCOSE 157* 167* 173* 127* 143*  BUN 27* 24* 20 17 22*  CREATININE 1.37* 1.23 1.17 1.18 1.23  CALCIUM 8.3* 8.5* 8.3* 8.4* 8.6*    Liver Function Tests: No results for input(s): AST, ALT, ALKPHOS, BILITOT, PROT, ALBUMIN in the last 168 hours. No results for input(s): LIPASE, AMYLASE in the last 168 hours. No results for input(s): AMMONIA in the last 168 hours.  CBC:  Recent Labs Lab 04/30/17 0415 04/30/17 0605 05/01/17 0538 05/02/17 0431 05/03/17 0330 05/04/17 0537  WBC 6.1  --  6.1 6.9 6.7 5.6  HGB 8.7* 8.9* 8.2* 8.7* 8.6* 8.8*  HCT 27.7* 29.1* 26.9* 27.9* 27.4* 28.2*  MCV 78.9  --  79.4 79.0 79.0 79.7  PLT 189  --  167 195 214 203    INR:  Recent Labs Lab 04/30/17 0415 05/01/17 0538  05/02/17 0431 05/03/17 0330 05/04/17 0537  INR 3.01 2.35 1.59 1.47 1.28    Other results:     Imaging   No results found.   Medications:     Scheduled Medications: . amiodarone  200 mg Oral Daily  . citalopram  20 mg Oral Daily  . digoxin  0.125 mg Oral Daily  . docusate sodium  200 mg Oral BID  . furosemide  80 mg Intravenous BID  . gabapentin  200 mg Oral BID  . insulin aspart  0-15 Units Subcutaneous TID WC  . insulin aspart  0-5 Units Subcutaneous QHS  . insulin glargine  20 Units Subcutaneous QHS  . losartan  25 mg Oral Daily  . magnesium oxide  400 mg Oral Daily  . metolazone  5 mg Oral Daily  . pantoprazole  40 mg Oral BID AC  . polyethylene glycol  17 g Oral Daily  . potassium chloride  40 mEq Oral BID  . senna-docusate  2 tablet Oral Daily  . sildenafil  40 mg Oral TID  . spironolactone  25 mg Oral Daily  . sucralfate  1 g Oral TID WC & HS  . Warfarin - Pharmacist Dosing Inpatient   Does not apply q1800     Infusions: . sodium chloride 20 mL/hr at 05/02/17 1652  . DOBUTamine 6 mcg/kg/min (05/02/17 1652)  . heparin 1,600 Units/hr (05/03/17 2353)    PRN Medications: acetaminophen, clonazePAM, ondansetron (ZOFRAN) IV, ondansetron, oxymetazoline, traMADol, zolpidem   Patient Profile   Ronald Milleris a 61 y.o.male with history of chronic systolic HF s/p Medtronic ICD 2014, CAD s/p CABG in 2000, HTN, Hx of cocaine abuse, Tobacco abuse, Depression, PAF, and COPD. HM3 placed 09/09/2016.   Presented to clinic 04/29/17 for follow up. Hgb down, INR up. Reports dark black stools and constipation. Admitted for treatment and further evaluation.    Assessment/Plan:    1. Acute on chronic anemia with Melena - likely acute upper GI bleed; IDA - 1 week of black stools with symptoms and drop in Hgb.- Iron low. Received Feraheme on 7/28  - Now s/p 2u RBCs. Hgb with suboptimal bump. Hgb down again today. Will transfuse further if Hgb < 8.0.  - Had scope with ulcers in stomach. Continue carafate and continue PPI twice a day.  - No aspirin.  Hgb 8.8  2. Acute/Chronic end-stage biventricular systolic WL:NLGX 21% due to ICM ->s/p Echo 08/24/16 LVEF 15%, RV mild dilated, moderately reduced. -->S/P HM3 LVAD 19/01/1739.  - Complicated by RV failure. Initially on milrinone but stopped due to low mixed venous saturation, Remains on home dobutamine 84mcg/kg/min. Todays CO-OX is stable 69%.   - Continues to struggle with severe RV failure. - On admit weight 233>224>221>223.  - CVP up to 14. Prior to admit he was on torsemide 100 mg twice a day.  - Continue IV lasix + metolazone. Continue  spiro to 25 mg daily. Renal function stable.  Add dose of tolvaptan.  -Continue losartan 25 mg daily.  - No beta blocker with RV failure - Digoxin level elevated. Dose was cut back to 0.125 mcg daily. Check dig level again as an outpatient.   3. CAD: No CP. Severe 3v- CAD s/p CABG with occluded grafts as above  except for LIMA.  -No S/S ischemia. Continue statin. Off ASA due to GIB  4. DMII:  - Continue scheduled insulin and SSI  5. Tobacco/substance abuse:  - Currently not using  6. Atrial fibrillation, paroxysmal -  Maintaining NSR.   7. Anxiety/Depression - Continue current dose of celexa and klonipin.    8. RUE DVT.  Back on coumadin.    9. Hyponatremia:  - Sodium down to 124.  I have asked him to limit free water. Give dose of tolvaptan.   10. CKD stage 3 - Stable. Creatinine 1.23 .   11. Hypokalemia- resolved K 3.8     He plans to go to home with his sister. He will need [facility] for home dobutamine 6 mcg. He has used [facility] in the past.  Order placed [facility] .   I reviewed the LVAD parameters from today, and compared the results to the patient's prior recorded data.  No programming changes were made.  The LVAD is functioning within specified parameters.  The patient performs LVAD self-test daily.  LVAD interrogation was negative for any significant power changes, alarms or PI events/speed drops.  LVAD equipment check completed and is in good working order.  Back-up equipment present.   LVAD education done on emergency procedures and precautions and reviewed exit site care.   Length of Stay: 5  Amy Clegg, NP 05/04/2017, 8:14 AM  VAD Team --- VAD ISSUES ONLY--- Pager [phone] (7am - 7am)  Advanced Heart Failure Team  Pager [phone] (M-F; 7a - 4p)  Please contact [facility] for night-coverage after hours (4p -7a ) and weekends on [email or web address]  Patient seen and examined with Darrick Grinder, NP. We discussed all aspects of the encounter. I agree with the assessment and plan as stated above.   Volume status back up and sodium down despite IV lasix. He is eating a lot of ice. Counseled on need for fluid restriction. Will continue IV lasix. Give dose of tolvaptan.   GI bleeding stable. Is having some epistaxis. Continue heparin and coumadin. Gastric biopsies reviewed personally. No H  pylori or malignancy. Continue bid PPI and carafate.   VAD parameters stable.   Glori Bickers, MD  11:18 AM

## 2017-05-05 ENCOUNTER — Other Ambulatory Visit (HOSPITAL_COMMUNITY): Payer: Medicare HMO

## 2017-05-05 ENCOUNTER — Telehealth (HOSPITAL_COMMUNITY): Payer: Self-pay | Admitting: Psychology

## 2017-05-05 LAB — BASIC METABOLIC PANEL
Anion gap: 9 (ref 5–15)
BUN: 24 mg/dL — ABNORMAL HIGH (ref 6–20)
CHLORIDE: 90 mmol/L — AB (ref 101–111)
CO2: 29 mmol/L (ref 22–32)
CREATININE: 1.23 mg/dL (ref 0.61–1.24)
Calcium: 9 mg/dL (ref 8.9–10.3)
GFR calc non Af Amer: >60 mL/min (ref >60)
Glucose, Bld: 146 mg/dL — ABNORMAL HIGH (ref 65–99)
POTASSIUM: 4.3 mmol/L (ref 3.5–5.1)
SODIUM: 128 mmol/L — AB (ref 135–145)

## 2017-05-05 LAB — GLUCOSE, CAPILLARY
GLUCOSE-CAPILLARY: 190 mg/dL — AB (ref 65–99)
GLUCOSE-CAPILLARY: 174 mg/dL — AB (ref 65–99)
Glucose-Capillary: 137 mg/dL — ABNORMAL HIGH (ref 65–99)
Glucose-Capillary: 165 mg/dL — ABNORMAL HIGH (ref 65–99)

## 2017-05-05 LAB — HEPARIN LEVEL (UNFRACTIONATED)
Heparin Unfractionated: 0.24 IU/mL — ABNORMAL LOW (ref 0.30–0.70)
Heparin Unfractionated: 0.3 IU/mL (ref 0.30–0.70)

## 2017-05-05 LAB — CBC
HEMATOCRIT: 28.9 % — AB (ref 39.0–52.0)
HEMOGLOBIN: 9 g/dL — AB (ref 13.0–17.0)
MCH: 25.1 pg — AB (ref 26.0–34.0)
MCHC: 31.1 g/dL (ref 30.0–36.0)
MCV: 80.5 fL (ref 78.0–100.0)
PLATELETS: 215 10*3/uL (ref 150–400)
RBC: 3.59 MIL/uL — AB (ref 4.22–5.81)
RDW: 21.1 % — ABNORMAL HIGH (ref 11.5–15.5)
WBC: 6.2 10*3/uL (ref 4.0–10.5)

## 2017-05-05 LAB — PROTIME-INR
INR: 1.34
Prothrombin Time: 16.7 seconds — ABNORMAL HIGH (ref 11.4–15.2)

## 2017-05-05 LAB — COOXEMETRY PANEL
Carboxyhemoglobin: 1.9 % — ABNORMAL HIGH (ref 0.5–1.5)
Methemoglobin: 1.2 % (ref 0.0–1.5)
O2 SAT: 59.2 %
Total hemoglobin: 9.3 g/dL — ABNORMAL LOW (ref 12.0–16.0)

## 2017-05-05 LAB — SODIUM: SODIUM: 128 mmol/L — AB (ref 135–145)

## 2017-05-05 LAB — LACTATE DEHYDROGENASE: LDH: 161 U/L (ref 98–192)

## 2017-05-05 MED ORDER — WARFARIN SODIUM 7.5 MG PO TABS
17.5000 mg | ORAL_TABLET | Freq: Once | ORAL | Status: AC
  Administered 2017-05-05: 19:00:00 17.5 mg via ORAL
  Filled 2017-05-05: qty 1

## 2017-05-05 NOTE — Care Management Note (Signed)
Case Management Note  Patient Details  Name: Chritopher Coster. MRN: [id number] Date of Birth: 03-Dec-1955  Subjective/Objective:   Pt admitted with GI bleed - hx of LVAD                 Action/Plan:   PTA at SNF - however pt refuses to return to SNF environment and plans to discharge to his sisters.  Pt will discharge home on IV dobutamine (has had med in the home previously with [facility]).  [facility] following and have accepted referral including home HF orders.  CM will continue to follow for discharge needs   Expected Discharge Date:  05/06/17               Expected Discharge Plan:  [place]  In-House Referral:     Discharge planning Services  CM Consult  Post Acute Care Choice:    Choice offered to:  Patient  DME Arranged:  IV pump/equipment DME Agency:  [place]:  RN [facility] Agency:  [place] [place]  Status of Service:  In process, will continue to follow  If discussed at Long Length of Stay Meetings, dates discussed:    Additional Comments: 05/05/2017 Discussed in LOS 05/05/17 - pt remains appropriate for continued stay Pt remains on IV lasix.  [facility] continues to follow for home IV inotrope  05/03/17 Pt is independently ambulatory in the room per bedside nurse.  Pt denied barriers with discharging home with [name] 05/05/2017, 10:31 AM

## 2017-05-05 NOTE — Progress Notes (Signed)
Advanced Heart Failure VAD Team Note  Subjective:    He has received 2u PRBCs. Hgb 7.6-> 8.9. Had enteroscopy 7/31 with ulcers in his stomach. Started on carafate. Back on heparin and coumadin.   Yesterday diuresed with IV lasix and metolazone + tolvaptin. Brisk diuresis noted. Weight down 6 pounds. CVP 12  Had nose bleed. Refused ENT.   Feeling better today. Wants to walk       LDH 161. INR 1.34    LVAD INTERROGATION:  HeartMate II LVAD:  Flow 5.5 liters/min, speed 5900, power 5 PI 3.1   Objective:    Vital Signs:   Temp:  [97.8 F (36.6 C)-98.2 F (36.8 C)] 97.9 F (36.6 C) (08/02 0429) Pulse Rate:  [74-87] 74 (08/01 2332) Resp:  [14-23] 16 (08/02 0429) BP: (84-109)/(34-78) 85/66 (08/02 0429) SpO2:  [93 %-96 %] 95 % (08/02 0429) Weight:  [217 lb 8 oz (98.7 kg)] 217 lb 8 oz (98.7 kg) (08/02 0429) Last BM Date: 05/04/17 Mean arterial Pressure  80s   Intake/Output:   Intake/Output Summary (Last 24 hours) at 05/05/17 0828 Last data filed at 05/05/17 0424  Gross per 24 hour  Intake            972.3 ml  Output             4350 ml  Net          -3377.7 ml     Physical Exam    Physical Exam: GENERAL: NAD. Sitting on the side of the bed.  HEENT: normal  NECK: Supple, JVP 11-12  .  2+ bilaterally, no bruits.  No lymphadenopathy or thyromegaly appreciated.   CARDIAC:  Mechanical heart sounds with LVAD hum present.  LUNGS:  Clear to auscultation bilaterally.  ABDOMEN:  Soft, round, nontender, positive bowel sounds x4.     LVAD exit site: well-healed and incorporated.  Dressing dry and intact.  No erythema or drainage.  Stabilization device present and accurately applied.  Driveline dressing is being changed daily per sterile technique. EXTREMITIES:  Warm and dry, no cyanosis, clubbing, rash. Tr edema  NEUROLOGIC:  Alert and oriented x 4.  Gait steady.  No aphasia.  No dysarthria.  Affect pleasant.       Telemetry   NSR personally .     EKG    N/A  Labs    Basic Metabolic Panel:  Recent Labs Lab 05/02/17 0431 05/03/17 0330 05/04/17 0537 05/04/17 0829 05/04/17 1700 05/04/17 2345 05/05/17 0240  NA 128* 127* 124* 125* 128* 128* 128*  K 3.3* 3.1* 3.8 4.1  --   --  4.3  CL 87* 86* 87* 86*  --   --  90*  CO2 32 32 31 31  --   --  29  GLUCOSE 173* 127* 143* 138*  --   --  146*  BUN 20 17 22* 19  --   --  24*  CREATININE 1.17 1.18 1.23 1.21  --   --  1.23  CALCIUM 8.3* 8.4* 8.6* 9.0  --   --  9.0    Liver Function Tests: No results for input(s): AST, ALT, ALKPHOS, BILITOT, PROT, ALBUMIN in the last 168 hours. No results for input(s): LIPASE, AMYLASE in the last 168 hours. No results for input(s): AMMONIA in the last 168 hours.  CBC:  Recent Labs Lab 05/01/17 0538 05/02/17 0431 05/03/17 0330 05/04/17 0537 05/05/17 0240  WBC 6.1 6.9 6.7 5.6 6.2  HGB 8.2* 8.7* 8.6* 8.8* 9.0*  HCT 26.9* 27.9* 27.4* 28.2* 28.9*  MCV 79.4 79.0 79.0 79.7 80.5  PLT 167 195 214 203 215    INR:  Recent Labs Lab 05/01/17 0538 05/02/17 0431 05/03/17 0330 05/04/17 0537 05/05/17 0240  INR 2.35 1.59 1.47 1.28 1.34    Other results:     Imaging   No results found.   Medications:     Scheduled Medications: . amiodarone  200 mg Oral Daily  . citalopram  20 mg Oral Daily  . digoxin  0.125 mg Oral Daily  . docusate sodium  200 mg Oral BID  . furosemide  80 mg Intravenous BID  . gabapentin  200 mg Oral BID  . insulin aspart  0-15 Units Subcutaneous TID WC  . insulin aspart  0-5 Units Subcutaneous QHS  . insulin glargine  20 Units Subcutaneous QHS  . losartan  25 mg Oral Daily  . magnesium oxide  400 mg Oral Daily  . metolazone  5 mg Oral Daily  . pantoprazole  40 mg Oral BID AC  . polyethylene glycol  17 g Oral Daily  . potassium chloride  40 mEq Oral BID  . senna-docusate  2 tablet Oral Daily  . sildenafil  40 mg Oral TID  . spironolactone  25 mg Oral Daily  . sucralfate  1 g Oral TID WC & HS  . Warfarin - Pharmacist  Dosing Inpatient   Does not apply q1800    Infusions: . sodium chloride 20 mL/hr at 05/02/17 1652  . DOBUTamine 6 mcg/kg/min (05/05/17 0400)  . heparin 1,700 Units/hr (05/05/17 0400)    PRN Medications: acetaminophen, clonazePAM, ondansetron (ZOFRAN) IV, ondansetron, oxymetazoline, traMADol, zolpidem   Patient Profile   Ronald Milleris a 61 y.o.male with history of chronic systolic HF s/p Medtronic ICD 2014, CAD s/p CABG in 2000, HTN, Hx of cocaine abuse, Tobacco abuse, Depression, PAF, and COPD. HM3 placed 09/09/2016.   Presented to clinic 04/29/17 for follow up. Hgb down, INR up. Reports dark black stools and constipation. Admitted for treatment and further evaluation.    Assessment/Plan:    1. Acute on chronic anemia with Melena - likely acute upper GI bleed; IDA - 1 week of black stools with symptoms and drop in Hgb.- Iron low. Received Feraheme on 7/28  - Now s/p 2u RBCs. Hgb with suboptimal bump. Hgb down again today. Will transfuse further if Hgb < 8.0.  - Had scope with ulcers in stomach. Continue carafate and continue PPI twice a day.  - No aspirin.  -Hgb today 9  2. Acute/Chronic end-stage biventricular systolic TG:GYIR 48% due to ICM ->s/p Echo 08/24/16 LVEF 15%, RV mild dilated, moderately reduced. -->S/P HM3 LVAD 54/03/2702.  - Complicated by RV failure. Initially on milrinone but stopped due to low mixed venous saturation, Remains on home dobutamine 28mcg/kg/min. Todays CO-OX is stable 59%.   - Continues to struggle with severe RV failure. - On admit weight 233>224>221>223>217. - Prior to admit he was on torsemide 100 mg twice a day.  - CVP coming down. Continue IV lasix + metolazone. Continue  spiro to 25 mg daily. Renal function stable.   -Continue losartan 25 mg daily.  - No beta blocker with RV failure - Digoxin level elevated. Dose was cut back to 0.125 mcg daily. Check dig level again as an outpatient.   3. CAD: No CP. Severe 3v- CAD s/p CABG with  occluded grafts as above except for LIMA.  -No S/S ischemia. Continue statin. Off ASA due  to GIB  4. DMII:  - Continue scheduled insulin and SSI  5. Tobacco/substance abuse:  - Currently not using  6. Atrial fibrillation, paroxysmal -In NSR  7. Anxiety/Depression - Continue current dose of celexa and klonipin.    8.H/O RUE DVT.  Back on coumadin.    9. Hyponatremia:  - sodium up to 128 after dose of tolvaptan   10. CKD stage 3 - Stable. Creatinine 1.23 .   11. Hypokalemia-resolved.   He plans to go to home with his sister. He will need [facility] for home dobutamine 6 mcg. He has used [facility] in the past.  Order placed [facility] .   12. Epistaxis- yesterday refused ENT consult.  Resolved  Consult cardiac rehab.   I reviewed the LVAD parameters from today, and compared the results to the patient's prior recorded data.  No programming changes were made.  The LVAD is functioning within specified parameters.  The patient performs LVAD self-test daily.  LVAD interrogation was negative for any significant power changes, alarms or PI events/speed drops.  LVAD equipment check completed and is in good working order.  Back-up equipment present.   LVAD education done on emergency procedures and precautions and reviewed exit site care.   Length of Stay: 6  Darrick Grinder, NP 05/05/2017, 8:28 AM  VAD Team --- VAD ISSUES ONLY--- Pager [phone] (7am - 7am)  Advanced Heart Failure Team  Pager [phone] (M-F; 7a - 4p)  Please contact [facility] for night-coverage after hours (4p -7a ) and weekends on [email or web address]  Patient seen and examined with Darrick Grinder, NP. We discussed all aspects of the encounter. I agree with the assessment and plan as stated above.   Has been struggling with RV failure. Continue IV dobutamine. Volume status and hyponatremia improved with IV lasix and tolvaptan yesterday. CVP 12 (checked personally). Reminded to avoid eating so much ice. Continue IV lasix one more day.   No  further melena or epistaxis. Hgb stable. Continue PPI and carfate. H. pylori negative.   Remains on heparin and warfarin. Stop heparin when INR >=1.8. Discussed with PharmD.   VAD parameters stable.   Glori Bickers, MD  8:56 AM

## 2017-05-05 NOTE — Progress Notes (Signed)
ANTICOAGULATION CONSULT NOTE - Follow Up Consult  Pharmacy Consult for heparin Indication: atrial fibrillation  Labs:  Recent Labs  05/03/17 0330  05/03/17 1438 05/04/17 0537 05/04/17 0829 05/05/17 0240  HGB 8.6*  --   --  8.8*  --  9.0*  HCT 27.4*  --   --  28.2*  --  28.9*  PLT 214  --   --  203  --  215  LABPROT 18.0*  --   --  16.0*  --  16.7*  INR 1.47  --   --  1.28  --  1.34  HEPARINUNFRC 1.28*  < > 0.25* 0.29*  --  0.24*  CREATININE 1.18  --   --  1.23 1.21  --   < > = values in this interval not displayed.   Assessment: 61yo male subtherapeutic on heparin; yesterday's level was slightly low but pt experienced fairly significant epistaxis so heparin rate was not increased; level now lower and no incidence of bleeding per RN since day shift.  Goal of Therapy:  Heparin level 0.3-0.5 units/ml   Plan:  Will increase heparin gtt slightly to 1700 units/hr and check level in 6hr.  Wynona Neat, PharmD, BCPS  05/05/2017,3:16 AM

## 2017-05-05 NOTE — Progress Notes (Signed)
LVAD Coordinator Rounding Note:  Admitted 7/27/2018due to a possible GI bleed.   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 Prescott Gum.   Vital signs: HR: 73 vpaced Doppler Pressure: 84 Automatic BP: 85/74 (80) O2 Sat: 99 RA Wt: 224>221>217lbs  LVAD interrogation reveals:  Speed:5900 Flow: 5.6 Power: 4.7 PI: 3.2 Alarms: none Events: 10+ daily Fixed speed: 5900 Low speed limit: 5600 HCT: 29  Drive Line: changed 3/56, will need to be changed again tomorrow 8/3.  Labs:  LDH trend: 156>167>158>161  INR trend: 3.01>2.35>1.59>1.47>1.28>1.34  HGB trend: 7.6>8.7>8 .9>8.2>8.7>8.6>8.8>9.0  Anticoagulation Plan: -INR Goal: 2-2.5 -ASA Dose: 81 mg  Adverse Events on VAD: -11/30/16> Milrinone gtt at discharge -01/27/17>dobutamine gtt at discharge  Blood products this admission:  04/30/17- 2 units PC  Gtts: Dobutamine 6 mcg Heparin 1700 units/hour  Pt in a very pleasant mood today. Pt is requesting a new wearables kit. I will bring up later this afternoon.   Plan/Recommendations:   1. Continue Heparin bridge. 2. Pt states he is planning to be discharged to his sister's house.    Tanda Rockers RN, VAD Coordinator 24/7 pager [phone]

## 2017-05-05 NOTE — Progress Notes (Signed)
CARDIAC REHAB PHASE I   PRE:  Rate/Rhythm: 74 paced  BP:  Sitting: 91/64 (74)        SaO2: 94 RA  MODE:  Ambulation: 240 ft   POST:  Rate/Rhythm: 76 SR  BP:  Sitting: 95/59 (71)         SaO2: 96 RA  Pt up in chair, states he feels better, agreeable to walk, able to connect self to batteries independently. Pt ambulated 240 ft on RA, rolling walker, IV, assist x2 for safety/equipment, slow, steady gait, tolerated fairly well. Pt c/o fatigue with distance, generalized weakness, standing rest x1. Pt returned to wall power source independently upon return to room. VSS. Pt to recliner after walk, feet elevated, call bell within reach. Will follow as x1.   [id number] Lenna Sciara, RN, BSN 05/05/2017 2:19 PM

## 2017-05-05 NOTE — Progress Notes (Signed)
ANTICOAGULATION CONSULT NOTE - Follow Up Consult  Pharmacy Consult for Heparin > warfarin Indication: atrial fibrillation  Allergies  Allergen Reactions  . Codeine Nausea And Vomiting and Other (See Comments)    In high doses  . Trazodone And Nefazodone Other (See Comments)    Dizziness with subsequent fall   . Lipitor [Atorvastatin] Nausea Only and Other (See Comments)    Nausea with high doses, tolerates 20mg  dose (08/22/16)  . Tape Other (See Comments)    Paper tape is ok    Patient Measurements: Heparin Dosing Weight: 81.5 kg  Vital Signs: Temp: 97.7 F (36.5 C) (08/02 0830) Temp Source: Axillary (08/02 0830) BP: 85/74 (08/02 0830) Pulse Rate: 74 (08/01 2332)  Labs:  Recent Labs  05/03/17 0330  05/04/17 0537 05/04/17 0829 05/05/17 0240 05/05/17 0914  HGB 8.6*  --  8.8*  --  9.0*  --   HCT 27.4*  --  28.2*  --  28.9*  --   PLT 214  --  203  --  215  --   LABPROT 18.0*  --  16.0*  --  16.7*  --   INR 1.47  --  1.28  --  1.34  --   HEPARINUNFRC 1.28*  < > 0.29*  --  0.24* 0.30  CREATININE 1.18  --  1.23 1.21 1.23  --   < > = values in this interval not displayed.  Estimated Creatinine Clearance: 66.5 mL/min (by C-G formula based on SCr of 1.23 mg/dL).   Medical History: Past Medical History:  Diagnosis Date  . AICD (automatic cardioverter/defibrillator) present   . ASCVD (arteriosclerotic cardiovascular disease)   . Chronic systolic CHF (congestive heart failure) ([place])   . COPD (chronic obstructive pulmonary disease) ([place])   . Coronary artery disease   . Depression   . Diabetes mellitus   . History of cocaine abuse   . Hypertension   . Presence of permanent cardiac pacemaker   . Shortness of breath dyspnea   . Suicidal ideation   . Tobacco abuse     Assessment: 23 yoM with end-stage heart failure (LVAD placement in 09/2016) on outpatient Dobutamione drip 68mcg/kg/min  for RV failure coox 60s -  with digoxin 0.25 level 1.1 dose dropped 0.149mcg qd,  and sildenafil 40tid, Losartan 25  MAPs stable 70-80s  On coumadin PTA 15mg  TTS/10mg  MWFSu admit INR 3.7 -  admitted with HGB drop and melena> s/p 2 uts PRBC slight increase in h/h post transfusion.  Held warfarin for GI work up> s/p EGD with ulcerative changes but not active bleeding> begin sucralfate and ppi po and ok to restart Coumadin.  Heparin level at goal on 1700 units/hr.  Had nosebleed last night, but now resolved. Pt refused ENT intervention.  INR slow to rise after re-initiation of Coumadin 7/30.  Goal of Therapy:  Heparin level 0.3-0.5 INR 2-2.5 Monitor platelets by anticoagulation protocol: Yes   Plan:  Warfarin 17.5 mg x 1 tonight.  Expect INR rise tomorrow. Continue IV heparin at 1700 units/hr. Daily heparin level, CBC and INR.  Uvaldo Rising, BCPS  Clinical Pharmacist Pager [phone]  05/05/2017 11:21 AM

## 2017-05-06 ENCOUNTER — Telehealth (HOSPITAL_COMMUNITY): Payer: Self-pay | Admitting: Psychology

## 2017-05-06 ENCOUNTER — Ambulatory Visit: Payer: Self-pay

## 2017-05-06 ENCOUNTER — Encounter (HOSPITAL_BASED_OUTPATIENT_CLINIC_OR_DEPARTMENT_OTHER): Payer: Medicare HMO

## 2017-05-06 LAB — GLUCOSE, CAPILLARY
GLUCOSE-CAPILLARY: 184 mg/dL — AB (ref 65–99)
GLUCOSE-CAPILLARY: 185 mg/dL — AB (ref 65–99)
GLUCOSE-CAPILLARY: 212 mg/dL — AB (ref 65–99)
Glucose-Capillary: 159 mg/dL — ABNORMAL HIGH (ref 65–99)

## 2017-05-06 LAB — HEPARIN LEVEL (UNFRACTIONATED): Heparin Unfractionated: 0.33 IU/mL (ref 0.30–0.70)

## 2017-05-06 LAB — CBC
HCT: 29.7 % — ABNORMAL LOW (ref 39.0–52.0)
HEMOGLOBIN: 9.3 g/dL — AB (ref 13.0–17.0)
MCH: 25.5 pg — AB (ref 26.0–34.0)
MCHC: 31.3 g/dL (ref 30.0–36.0)
MCV: 81.6 fL (ref 78.0–100.0)
Platelets: 210 10*3/uL (ref 150–400)
RBC: 3.64 MIL/uL — AB (ref 4.22–5.81)
RDW: 21.9 % — ABNORMAL HIGH (ref 11.5–15.5)
WBC: 6.4 10*3/uL (ref 4.0–10.5)

## 2017-05-06 LAB — BASIC METABOLIC PANEL
ANION GAP: 11 (ref 5–15)
BUN: 25 mg/dL — ABNORMAL HIGH (ref 6–20)
CALCIUM: 9.2 mg/dL (ref 8.9–10.3)
CHLORIDE: 90 mmol/L — AB (ref 101–111)
CO2: 28 mmol/L (ref 22–32)
Creatinine, Ser: 1.31 mg/dL — ABNORMAL HIGH (ref 0.61–1.24)
GFR calc non Af Amer: 58 mL/min — ABNORMAL LOW (ref >60)
Glucose, Bld: 177 mg/dL — ABNORMAL HIGH (ref 65–99)
Potassium: 4.2 mmol/L (ref 3.5–5.1)
Sodium: 129 mmol/L — ABNORMAL LOW (ref 135–145)

## 2017-05-06 LAB — PROTIME-INR
INR: 1.46
PROTHROMBIN TIME: 17.8 s — AB (ref 11.4–15.2)

## 2017-05-06 LAB — LACTATE DEHYDROGENASE: LDH: 155 U/L (ref 98–192)

## 2017-05-06 LAB — COOXEMETRY PANEL
Carboxyhemoglobin: 1.9 % — ABNORMAL HIGH (ref 0.5–1.5)
Methemoglobin: 1.3 % (ref 0.0–1.5)
O2 Saturation: 70.6 %
Total hemoglobin: 9.6 g/dL — ABNORMAL LOW (ref 12.0–16.0)

## 2017-05-06 MED ORDER — FERUMOXYTOL INJECTION 510 MG/17 ML
510.0000 mg | Freq: Once | INTRAVENOUS | Status: AC
  Administered 2017-05-07: 510 mg via INTRAVENOUS
  Filled 2017-05-06 (×2): qty 17

## 2017-05-06 MED ORDER — WARFARIN SODIUM 7.5 MG PO TABS
15.0000 mg | ORAL_TABLET | Freq: Once | ORAL | Status: AC
  Administered 2017-05-06: 15 mg via ORAL
  Filled 2017-05-06: qty 2

## 2017-05-06 NOTE — Progress Notes (Signed)
CARDIAC REHAB PHASE I  Pt in chair, states he is extremely tired, states he did not sleep last night, declines ambulation at this time. Encouraged ambulation later today with staff. Pt verbalized understanding. Will follow.   Lenna Sciara, RN, BSN 05/06/2017 3:01 PM

## 2017-05-06 NOTE — Discharge Summary (Signed)
Advanced Heart Failure Team  Discharge Summary   Patient ID: Ronald Miller. MRN: [id number], DOB/AGE: 06-09-1956 61 y.o. Admit date: 04/29/2017 D/C date:     05/09/2017   Primary Discharge Diagnoses:  1. Acute on chronic anemia with Melena - likely acute upper GI bleed; IDA Scope showed ulcers in the stomach. Continue PPI twice daily + carafate added.  Discharge hgb 9.3.  2. 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 -->S/P HM3 LVAD 09/09/2016 On dobutamine 7.99mcg via R upper chest tunneled PICC   3. CAD:  Severe 3v- CAD s/p CABG with occluded grafts as above except for LIMA.  -No S/S ischemia. Continue statin. Off ASA due to GIB 4. DMII:  - Continue scheduled insulin and SSI 5. Tobacco/substance abuse:  6. Atrial fibrillation, paroxysmal 7. Anxiety/Depression 8.H/O RUE DVT.  9. Hyponatremia: 10. CKD stage 3 11. Hypokalemia-resolved.  12. Epistaxis-   Hospital Course:   Ronald Miller a 61 y.o.male with history of chronic systolic HF s/p [employer] ICD 2014, CAD s/p CABG in 2000, HTN, Hx of cocaine abuse, Tobacco abuse, Depression, PAF, and COPD. HM3 placed 09/09/2016.   Admitted with symptomatic anemia and melena. GI was consulted and once INR drifted down he had EGD that showed ulcers in the stomach so carafate was added. He continued on PPI twice a day and will remain off aspirin. Overall transfused 2 UPRBCs. Hgb at the time of discharge is 9.3. Plan to repeat CBC next week. Hospital course complicated by RV failure and volume overload. Mixed venous saturation was low so dobutamine was increased to 7.5 mcg. Diuresed with IV lasix and transitioned to torsemide 100 mg twice a day. Over all diuresed 14 pounds. He will continue to be followed by [facility] for home dobutamine + diuretic protocol. Follow up in the LVAD clinic next week.   1. Acute on chronic anemia with Melena - likely  acute upper GI bleed; IDA - 1 week of black stools with symptoms and drop in Hgb.- Iron low. Received Feraheme on 7/28  - Received 2 PRBCs. Had scope with ulcers in stomach. Continue carafate and continue PPI twice a day. - No aspirin.  - Hgb 9.3 Stable. CBC next Monday in the LVAD clinic.   2. Acute/Chronic end-stage biventricular systolic RC:VELF 81% due to ICM ->s/p Echo 08/24/16 LVEF 15%, RV mild dilated, moderately reduced. -->S/P HM3 LVAD 10/10/5100.  - Complicated by RV failure. Initially on milrinone but stopped due to low mixed venous saturation, Dobutamine increased 7.5 mcg due to low mixed venous saturation. Co-ox on the day 60 %. Diuresed with IV lasix and transitioned to torsemide 100 mg twice a day. CVP today 9. Renal function stable.  -Continue losartan 25 mg daily.  - No beta blocker with RV failure - Digoxin level elevated. Dose was cut back to 0.125 mcg daily. Check dig level again as an outpatient.  3. CAD: No CP. 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  -No S/S ischemia. Continue statin. Off ASA due to GIB 4. DMII:  - Continue scheduled insulin and SSI.  5. Tobacco/substance abuse:  - Currently not using 6. Atrial fibrillation, paroxysmal In NSR . On coumadin for anticoagulant.  7. Anxiety/Depression - Continue current dose of celexa and klonipin.   8.H/O RUE DVT.  Back on coumadin.   9. Hyponatremia:  - sodium dropped to 124. Received a dose of tolvaptan.  Sodium on the day of discharge 129.  10. CKD stage 3 Renal function remained stable. Check BMET Monday.  11. Hypokalemia-resolved.  12. Epistaxis- Had episodes epistaxis. Offered ENT however he refused.    Discharge Weight: 212 pounds  Discharge Vitals: Blood pressure (!) 88/60, pulse 72, temperature 97.6 F (36.4 C), temperature source Axillary, resp. rate (!) 22, height 5\' 3"  (1.6 m), weight 212 lb 4.9 oz (96.3 kg), SpO2 98 %.  Labs: Lab Results  Component Value  Date   WBC 8.4 05/09/2017   HGB 9.4 (L) 05/09/2017   HCT 29.7 (L) 05/09/2017   MCV 80.9 05/09/2017   PLT 169 05/09/2017     Recent Labs Lab 05/09/17 0430  NA 129*  K 3.6  CL 89*  CO2 31  BUN 30*  CREATININE 1.45*  CALCIUM 8.7*  GLUCOSE 170*   Lab Results  Component Value Date   CHOL 153 08/15/2013   HDL 37 (L) 08/15/2013   LDLCALC 89 08/15/2013   TRIG 133 08/15/2013   BNP (last 3 results)  Recent Labs  10/13/16 1207 11/30/16 1158 01/21/17 1230  BNP 321.5* 565.5* 649.9*    ProBNP (last 3 results) No results for input(s): PROBNP in the last 8760 hours.   Diagnostic Studies/Procedures   No results found.  Discharge Medications   Allergies as of 05/09/2017      Reactions   Codeine Nausea And Vomiting, Other (See Comments)   In high doses   Trazodone And Nefazodone Other (See Comments)   Dizziness with subsequent fall    Lipitor [atorvastatin] Nausea Only, Other (See Comments)   Nausea with high doses, tolerates 20mg  dose (08/22/16)   Tape Other (See Comments)   Paper tape is ok      Medication List    STOP taking these medications   aspirin 81 MG EC tablet   insulin detemir 100 UNIT/ML injection Commonly known as:  LEVEMIR     TAKE these medications   amiodarone 200 MG tablet Commonly known as:  PACERONE Take 1 tablet (200 mg total) by mouth daily.   citalopram 20 MG tablet Commonly known as:  CELEXA Take 1 tablet (20 mg total) by mouth daily.   clonazePAM 0.25 MG disintegrating tablet Commonly known as:  KLONOPIN Take 1 tablet (0.25 mg total) by mouth 2 (two) times daily.   digoxin 0.125 MG tablet Commonly known as:  DIGOX Take 1 tablet (0.125 mg total) by mouth daily. What changed:  medication strength  how much to take   DOBUTamine 4-5 MG/ML-% infusion Commonly known as:  DOBUTREX Inject 714.75 mcg/min into the vein continuous. PER [facility] pharmacy What changed:  how much to take   docusate sodium 100 MG capsule Commonly known  as:  COLACE Take 200 mg by mouth at bedtime.   gabapentin 100 MG capsule Commonly known as:  NEURONTIN Take 2 capsules (200 mg total) by mouth 2 (two) times daily.   insulin aspart 100 UNIT/ML FlexPen Commonly known as:  NOVOLOG FLEXPEN Inject 5 Units into the skin 3 (three) times daily with meals. What changed:  how much to take   Insulin Glargine 100 UNIT/ML Solostar Pen Commonly known as:  LANTUS SOLOSTAR Inject 20 Units into the skin daily at 10 pm.   losartan 25 MG tablet Commonly known as:  COZAAR Take 1 tablet (25 mg total) by mouth daily.   magnesium oxide 400 (241.3 Mg) MG tablet Commonly known as:  MAG-OX Take 1 tablet (400 mg total) by mouth daily.  multivitamin with minerals Tabs tablet Take 1 tablet by mouth daily.   ondansetron 4 MG disintegrating tablet Commonly known as:  ZOFRAN ODT Take 1 tablet (4 mg total) by mouth every 8 (eight) hours as needed for nausea.   pantoprazole 40 MG tablet Commonly known as:  PROTONIX Take 1 tablet (40 mg total) by mouth 2 (two) times daily before a meal. What changed:  when to take this   potassium chloride SA 20 MEQ tablet Commonly known as:  K-DUR,KLOR-CON Take 2 tablets (40 mEq total) by mouth 2 (two) times daily. What changed:  when to take this   senna-docusate 8.6-50 MG tablet Commonly known as:  SENOKOT S Take 2 tablets by mouth at bedtime as needed for mild constipation. What changed:  when to take this  reasons to take this   sildenafil 20 MG tablet Commonly known as:  REVATIO Take 2 tablets (40 mg total) by mouth 3 (three) times daily. What changed:  how much to take   spironolactone 25 MG tablet Commonly known as:  ALDACTONE Take 1 tablet (25 mg total) by mouth daily. What changed:  See the new instructions.   sucralfate 1 GM/10ML suspension Commonly known as:  CARAFATE Take 10 mLs (1 g total) by mouth 4 (four) times daily -  with meals and at bedtime.   torsemide 100 MG tablet Commonly  known as:  DEMADEX Take 1 tablet (100 mg total) by mouth 2 (two) times daily. What changed:  medication strength   traMADol 50 MG tablet Commonly known as:  ULTRAM Take 1 tablet (50 mg total) by mouth 3 (three) times daily as needed for severe pain.   warfarin 5 MG tablet Commonly known as:  COUMADIN Take 2-3 tablets (10-15 mg total) by mouth See admin instructions. Take 3 tablets (15 mg) on Tues, Thurs, Sat Take 2 tablets (10 mg) on Mon, Wed, Fri, Sun   zolpidem 10 MG tablet Commonly known as:  AMBIEN Take 10 mg by mouth at bedtime as needed for sleep.            Durable Medical Equipment        Start     Ordered   05/09/17 1230  Heart failure home health orders  (Heart failure home health orders / Face to face)  Once    Comments:  Heart Failure Follow-up Care:  Verify follow-up appointments per Patient Discharge Instructions. Confirm transportation arranged. Reconcile home medications with discharge medication list. Remove discontinued medications from use. Assist patient/caregiver to manage medications using pill box. Reinforce low sodium food selection Assessments: Vital signs and oxygen saturation at each visit. Assess home environment for safety concerns, caregiver support and availability of low-sodium foods. Consult Education officer, museum, PT/OT, Dietitian, and CNA based on assessments. Perform comprehensive cardiopulmonary assessment. Notify MD for any change in condition or weight gain of 3 pounds in one day or 5 pounds in one week with symptoms. Daily Weights and Symptom Monitoring: Ensure patient has access to scales. Teach patient/caregiver to weigh daily before breakfast and after voiding using same scale and record.    Teach patient/caregiver to track weight and symptoms and when to notify Provider. Activity: Develop individualized activity plan with patient/caregiver.   Dobutamine 7.5 mcg/kg/min from [place] x12 months  Question Answer Comment  Heart Failure  Follow-up Care Advanced Heart Failure ([facility]F) [facility] at [place] Visits Set up telemonitoring equipment to monitor daily vital signs, weights and oxygen saturation   Obtain the following  labs Basic Metabolic Panel   Lab frequency Weekly   Fax lab results to AHF Clinic at [phone]   Diet Low Sodium Heart Healthy   Fluid restrictions: 2000 mL Fluid   Skilled Nurse to notify MD of weight trends weekly for first 2 weeks. May fax or call: AHF Clinic at [phone] (fax) or [facility] Diuretic Protocol to be used by [place] only ( to be ordered by Heart Failure Team Providers Only) Yes      05/09/17 1231   05/02/17 1147  Heart failure home health orders  (Heart failure home health orders / Face to face)  Once    Comments:  Heart Failure Follow-up Care:  Verify follow-up appointments per Patient Discharge Instructions. Confirm transportation arranged. Reconcile home medications with discharge medication list. Remove discontinued medications from use. Assist patient/caregiver to manage medications using pill box. Reinforce low sodium food selection Assessments: Vital signs and oxygen saturation at each visit. Assess home environment for safety concerns, caregiver support and availability of low-sodium foods. Consult Education officer, museum, PT/OT, Dietitian, and CNA based on assessments. Perform comprehensive cardiopulmonary assessment. Notify MD for any change in condition or weight gain of 3 pounds in one day or 5 pounds in one week with symptoms. Daily Weights and Symptom Monitoring: Ensure patient has access to scales. Teach patient/caregiver to weigh daily before breakfast and after voiding using same scale and record.    Teach patient/caregiver to track weight and symptoms and when to notify Provider. Activity: Develop individualized activity plan with patient/caregiver.  Dobutamine 6 mcg/kg/min per [place] x12 months  Question  Answer Comment  Heart Failure Follow-up Care Advanced Heart Failure (AHF) Clinic at [place] Visits Set up telemonitoring equipment to monitor daily vital signs, weights and oxygen saturation   Obtain the following labs Basic Metabolic Panel   Lab frequency Other see comments   Fax lab results to AHF Clinic at [phone]   Diet Low Sodium Heart Healthy   Fluid restrictions: 2000 mL Fluid   Initiate Heart Failure Clinic Diuretic Protocol to be used by [place] only ( to be ordered by Heart Failure Team Providers Only) Yes      05/02/17 1148      Disposition   The patient will be discharged in stable condition to home. Discharge Instructions    (HEART FAILURE PATIENTS) Call MD:  Anytime you have any of the following symptoms: 1) 3 pound weight gain in 24 hours or 5 pounds in 1 week 2) shortness of breath, with or without a dry hacking cough 3) swelling in the hands, feet or stomach 4) if you have to sleep on extra pillows at night in order to breathe.    Complete by:  As directed    Diet - low sodium heart healthy    Complete by:  As directed    Heart Failure patients record your daily weight using the same scale at the same time of day    Complete by:  As directed    Increase activity slowly    Complete by:  As directed    Page VAD Coordinator at [phone]  Notify for: any VAD alarms, sustained elevations of power >10 watts, sustained drop in Pulse Index <3    Complete by:  As directed    Notify for:   any VAD alarms sustained elevations of power >10 watts sustained drop in Pulse Index <3  Speed Settings:    Complete by:  As directed    Fixed 5900 RPM Low 5600 RPM     Follow-up Information    [facility].   Why:  registered nurse, heart failure protocol Contact information: [place] [address] [place] Follow up.   Why:  IV pump for  dobutamine Contact information: [place] [address] [phone]        Jameka Ivie, Shaune Pascal, MD Follow up on 05/16/2017.   Specialty:  Cardiology Why:  at 1000. Garage Code 1287  Contact information: [place] [address] [phone]             Duration of Discharge Encounter: Greater than 35 minutes   Signed, Darrick Grinder  NP-C  05/09/2017, 11:45 AM   Patient seen and examined with Darrick Grinder, NP. We discussed all aspects of the encounter. I agree with the assessment and plan as stated above.   He is improved and stable for discharge. Will need f/u in VAD clinic.   Glori Bickers, MD  10:33 PM

## 2017-05-06 NOTE — Care Management Note (Signed)
Case Management Note  Patient Details  Name: Ronald Miller. MRN: [id number] Date of Birth: [birth year]  Subjective/Objective:   Pt admitted with GI bleed - hx of LVAD                 Action/Plan:   PTA at SNF - however pt refuses to return to SNF environment and plans to discharge to his sisters.  Pt will discharge home on IV dobutamine (has had med in the home previously with [facility]).  [facility] following and have accepted referral including home HF orders.  CM will continue to follow for discharge needs   Expected Discharge Date:  05/06/17               Expected Discharge Plan:  [place]  In-House Referral:     Discharge planning Services  CM Consult  Post Acute Care Choice:    Choice offered to:  Patient  DME Arranged:  IV pump/equipment DME Agency:  [place]:  RN [facility] Agency:  [place]  Status of Service:  In process, will continue to follow  If discussed at Long Length of Stay Meetings, dates discussed:    Additional Comments: 05/06/2017  Home with INR 2 - currently 1.4.  [facility] continues to follow for home dobutamine.  Discussed in LOS 05/05/17 - pt remains appropriate for continued stay Pt remains on IV lasix.  [facility] continues to follow for home IV inotrope  05/03/17 Pt is independently ambulatory in the room per bedside nurse.  Pt denied barriers with discharging home with [name] 05/06/2017, 9:17 AM

## 2017-05-06 NOTE — Progress Notes (Signed)
ANTICOAGULATION CONSULT NOTE - Follow Up Consult  Pharmacy Consult for Heparin > warfarin Indication: atrial fibrillation  Allergies  Allergen Reactions  . Codeine Nausea And Vomiting and Other (See Comments)    In high doses  . Trazodone And Nefazodone Other (See Comments)    Dizziness with subsequent fall   . Lipitor [Atorvastatin] Nausea Only and Other (See Comments)    Nausea with high doses, tolerates 20mg  dose (08/22/16)  . Tape Other (See Comments)    Paper tape is ok    Patient Measurements: Heparin Dosing Weight: 81.5 kg  Vital Signs: Temp: 98.2 F (36.8 C) (08/03 0759) Temp Source: Oral (08/03 0759) BP: 102/65 (08/03 0800) Pulse Rate: 73 (08/03 0759)  Labs:  Recent Labs  05/04/17 0537 05/04/17 0829 05/05/17 0240 05/05/17 0914 05/06/17 0455 05/06/17 0618  HGB 8.8*  --  9.0*  --  9.3*  --   HCT 28.2*  --  28.9*  --  29.7*  --   PLT 203  --  215  --  210  --   LABPROT 16.0*  --  16.7*  --   --  17.8*  INR 1.28  --  1.34  --   --  1.46  HEPARINUNFRC 0.29*  --  0.24* 0.30  --  0.33  CREATININE 1.23 1.21 1.23  --  1.31*  --     Estimated Creatinine Clearance: 62.2 mL/min (A) (by C-G formula based on SCr of 1.31 mg/dL (H)).   Medical History: Past Medical History:  Diagnosis Date  . AICD (automatic cardioverter/defibrillator) present   . ASCVD (arteriosclerotic cardiovascular disease)   . Chronic systolic CHF (congestive heart failure) ([place])   . COPD (chronic obstructive pulmonary disease) ([place])   . Coronary artery disease   . Depression   . Diabetes mellitus   . History of cocaine abuse   . Hypertension   . Presence of permanent cardiac pacemaker   . Shortness of breath dyspnea   . Suicidal ideation   . Tobacco abuse     Assessment: 50 yoM with end-stage heart failure (LVAD placement in 09/2016) on outpatient Dobutamione drip 24mcg/kg/min  for RV failure coox 60s -  with digoxin 0.25 level 1.1 dose dropped 0.199mcg qd, and sildenafil 40tid,  Losartan 25  MAPs stable 70-80s  On coumadin PTA 15mg  TTS/10mg  MWFSu admit INR 3.7 -  admitted with HGB drop and melena> s/p 2 uts PRBC slight increase in h/h post transfusion.  Held warfarin for GI work up> s/p EGD with ulcerative changes but not active bleeding> begin sucralfate and ppi po and ok to restart Coumadin.  Heparin level at goal on 1700 units/hr.  Had nosebleeds 8/1, but now resolved. Pt refused ENT intervention.  INR rising slowly after re-initiation of Coumadin 7/30.  Goal of Therapy:  Heparin level 0.3-0.5 INR 2-2.5 Monitor platelets by anticoagulation protocol: Yes   Plan:  Warfarin 15 mg x 1 tonight.  Expect more significant INR rise tomorrow. Continue IV heparin at 1700 units/hr. Daily heparin level, CBC and INR.  Uvaldo Rising, BCPS  Clinical Pharmacist Pager [phone]  05/06/2017 11:23 AM

## 2017-05-06 NOTE — Care Management Note (Signed)
Case Management Note  Patient Details  Name: Ronald Miller. MRN: [id number] Date of Birth: 08/02/1956  Subjective/Objective:   Pt admitted with GI bleed - hx of LVAD                 Action/Plan:   PTA at SNF - however pt refuses to return to SNF environment and plans to discharge to his sisters.  Pt will discharge home on IV dobutamine (has had med in the home previously with [facility]).  [facility] following and have accepted referral including home HF orders.  CM will continue to follow for discharge needs   Expected Discharge Date:  05/06/17               Expected Discharge Plan:  [place]  In-House Referral:     Discharge planning Services  CM Consult  Post Acute Care Choice:    Choice offered to:  Patient  DME Arranged:  IV pump/equipment DME Agency:  [place]:  RN [facility] Agency:  [place]  Status of Service:  In process, will continue to follow  If discussed at Long Length of Stay Meetings, dates discussed:    Additional Comments: 05/06/2017  Home with INR 2 - currently 1.4.  [facility] continues to follow for home dobutamine.  CM informed that CSW with HF team has informed pt that he will no longer be able to stay alone - CM left voicemail for HF CSW.  Pt is only planning on staying with sister for 3 weeks.  CM will continue to follow for discharge planning  Discussed in LOS 05/05/17 - pt remains appropriate for continued stay Pt remains on IV lasix.  [facility] continues to follow for home IV inotrope  05/03/17 Pt is independently ambulatory in the room per bedside nurse.  Pt denied barriers with discharging home with [name] 05/06/2017, 11:40 AM

## 2017-05-06 NOTE — Progress Notes (Signed)
Advanced Heart Failure VAD Team Note  Subjective:    He has received 2u PRBCs. Hgb 7.6-> 8.9. Had enteroscopy 7/31 with ulcers in his stomach. Started on carafate. Back on heparin and coumadin.   Yesterday he contined to diurese with IV lasix + metolazone. Weight down another 2 pounds. No bleeding problems.  Denies SOB.   LDH 155 INR 1.46   LVAD INTERROGATION:  HeartMate II LVAD:  Flow 5.6  liters/min, speed 5900, power 5 PI 3.2    Objective:    Vital Signs:   Temp:  [97.7 F (36.5 C)-98.3 F (36.8 C)] 98.2 F (36.8 C) (08/03 0759) Pulse Rate:  [73-74] 73 (08/03 0759) Resp:  [16-20] 18 (08/03 0446) BP: (83-99)/(58-80) 83/58 (08/03 0446) SpO2:  [93 %-99 %] 93 % (08/03 0759) Weight:  [215 lb 11.2 oz (97.8 kg)] 215 lb 11.2 oz (97.8 kg) (08/03 0449) Last BM Date: 05/05/17 Mean arterial Pressure  80s   Intake/Output:   Intake/Output Summary (Last 24 hours) at 05/06/17 0809 Last data filed at 05/06/17 0446  Gross per 24 hour  Intake           2003.2 ml  Output             3925 ml  Net          -1921.8 ml     Physical Exam   Physical Exam: CVP ~13-14 GENERAL: Well appearing, NAD. In bed.  HEENT: normal  NECK: Supple, JVP to jaw  .  2+ bilaterally, no bruits.  No lymphadenopathy or thyromegaly appreciated.   CARDIAC:  Mechanical heart sounds with LVAD hum present. R upper chest tunneled PICC LUNGS:  Clear to auscultation bilaterally.  ABDOMEN:  Soft, round, nontender, positive bowel sounds x4.     LVAD exit site: well-healed and incorporated.  Dressing dry and intact.  No erythema or drainage.  Stabilization device present and accurately applied.  Driveline dressing is being changed daily per sterile technique. EXTREMITIES:  Warm and dry, no cyanosis, clubbing, rash or edema  NEUROLOGIC:  Alert and oriented x 4.  Gait steady.  No aphasia.  No dysarthria.  Affect pleasant.      Telemetry   NSR personally reviewed.    EKG    N/A  Labs   Basic Metabolic  Panel:  Recent Labs Lab 05/03/17 0330 05/04/17 0537 05/04/17 0829 05/04/17 1700 05/04/17 2345 05/05/17 0240 05/06/17 0455  NA 127* 124* 125* 128* 128* 128* 129*  K 3.1* 3.8 4.1  --   --  4.3 4.2  CL 86* 87* 86*  --   --  90* 90*  CO2 32 31 31  --   --  29 28  GLUCOSE 127* 143* 138*  --   --  146* 177*  BUN 17 22* 19  --   --  24* 25*  CREATININE 1.18 1.23 1.21  --   --  1.23 1.31*  CALCIUM 8.4* 8.6* 9.0  --   --  9.0 9.2    Liver Function Tests: No results for input(s): AST, ALT, ALKPHOS, BILITOT, PROT, ALBUMIN in the last 168 hours. No results for input(s): LIPASE, AMYLASE in the last 168 hours. No results for input(s): AMMONIA in the last 168 hours.  CBC:  Recent Labs Lab 05/02/17 0431 05/03/17 0330 05/04/17 0537 05/05/17 0240 05/06/17 0455  WBC 6.9 6.7 5.6 6.2 6.4  HGB 8.7* 8.6* 8.8* 9.0* 9.3*  HCT 27.9* 27.4* 28.2* 28.9* 29.7*  MCV 79.0 79.0 79.7 80.5 81.6  PLT 195 214 203 215 210    INR:  Recent Labs Lab 05/02/17 0431 05/03/17 0330 05/04/17 0537 05/05/17 0240 05/06/17 0618  INR 1.59 1.47 1.28 1.34 1.46    Other results:     Imaging   No results found.   Medications:     Scheduled Medications: . amiodarone  200 mg Oral Daily  . citalopram  20 mg Oral Daily  . digoxin  0.125 mg Oral Daily  . docusate sodium  200 mg Oral BID  . furosemide  80 mg Intravenous BID  . gabapentin  200 mg Oral BID  . insulin aspart  0-15 Units Subcutaneous TID WC  . insulin aspart  0-5 Units Subcutaneous QHS  . insulin glargine  20 Units Subcutaneous QHS  . losartan  25 mg Oral Daily  . magnesium oxide  400 mg Oral Daily  . metolazone  5 mg Oral Daily  . pantoprazole  40 mg Oral BID AC  . polyethylene glycol  17 g Oral Daily  . potassium chloride  40 mEq Oral BID  . senna-docusate  2 tablet Oral Daily  . sildenafil  40 mg Oral TID  . spironolactone  25 mg Oral Daily  . sucralfate  1 g Oral TID WC & HS  . Warfarin - Pharmacist Dosing Inpatient   Does  not apply q1800    Infusions: . sodium chloride 20 mL/hr at 05/02/17 1652  . DOBUTamine 6 mcg/kg/min (05/06/17 0200)  . heparin 1,700 Units/hr (05/06/17 0200)    PRN Medications: acetaminophen, clonazePAM, ondansetron (ZOFRAN) IV, ondansetron, oxymetazoline, traMADol, zolpidem   Patient Profile   Ronald Milleris a 61 y.o.male with history of chronic systolic HF s/p Medtronic ICD 2014, CAD s/p CABG in 2000, HTN, Hx of cocaine abuse, Tobacco abuse, Depression, PAF, and COPD. HM3 placed 09/09/2016.   Presented to clinic 04/29/17 for follow up. Hgb down, INR up. Reports dark black stools and constipation. Admitted for treatment and further evaluation.    Assessment/Plan:    1. Acute on chronic anemia with Melena - likely acute upper GI bleed; IDA - 1 week of black stools with symptoms and drop in Hgb.- Iron low. Received Feraheme on 7/28  - Now s/p 2u RBCs. Hgb with suboptimal bump. Hgb down again today. Will transfuse further if Hgb < 8.0.  - Had scope with ulcers in stomach. Continue carafate and continue PPI twice a day.  - No aspirin.  - Hgb 9.3 Stable. CBC in am.    2. Acute/Chronic end-stage biventricular systolic PI:RJJO 84% due to ICM ->s/p Echo 08/24/16 LVEF 15%, RV mild dilated, moderately reduced. -->S/P HM3 LVAD 16/03/629.  - Complicated by RV failure. Initially on milrinone but stopped due to low mixed venous saturation, Remains on home dobutamine 66mcg/kg/min.  Todays CO-OX is 71%.   - Continues to struggle with severe RV failure. - On admit weight 233 now down 215. - Prior to admit he was on torsemide 100 mg twice a day.  - CVP 13-14 . Maps Stable.  Continue IV lasix + metolazone. Continue  spiro to 25 mg daily. Renal function stable.   -Continue losartan 25 mg daily.  - No beta blocker with RV failure - Digoxin level elevated. Dose was cut back to 0.125 mcg daily. Check dig level again as an outpatient.   3. CAD: No CP. Severe 3v- CAD s/p CABG with  occluded grafts as above except for LIMA.  -No S/S ischemia. Continue statin. Off ASA due to GIB  4. DMII:  - Continue scheduled insulin and SSI  5. Tobacco/substance abuse:  - Currently not using  6. Atrial fibrillation, paroxysmal In NSR   7. Anxiety/Depression - Continue current dose of celexa and klonipin.    8.H/O RUE DVT.  Back on coumadin.    9. Hyponatremia:  - sodium trending up 129.   10. CKD stage 3 - Stable. Creatinine 1.3   11. Hypokalemia-resolved.   12. Epistaxis- yesterday refused ENT consult.  Resolved   He plans to go to home with his sister. He will need [facility] for home dobutamine 6 mcg. He has used [facility] in the past.  Order placed [facility] .   Anticipate D/C Monday   I reviewed the LVAD parameters from today, and compared the results to the patient's prior recorded data.  No programming changes were made.  The LVAD is functioning within specified parameters.  The patient performs LVAD self-test daily.  LVAD interrogation was negative for any significant power changes, alarms or PI events/speed drops.  LVAD equipment check completed and is in good working order.  Back-up equipment present.   LVAD education done on emergency procedures and precautions and reviewed exit site care.   Length of Stay: [place], NP 05/06/2017, 8:09 AM  VAD Team --- VAD ISSUES ONLY--- Pager [phone] (7am - 7am)  Advanced Heart Failure Team  Pager [phone] (M-F; 7a - 4p)  Please contact [facility] for night-coverage after hours (4p -7a ) and weekends on [email or web address]  Patient seen and examined with Darrick Grinder, NP. We discussed all aspects of the encounter. I agree with the assessment and plan as stated above.   Improving with diuresis. CVP 13-14. INR 1.4. Will continue IV diuresis. Continue heparin/coumadin. Home when INR >=2.0. No further bleeding. Hyponatremia improved with stopping ice intake. VAD parameters stable.   Plan d/c Monday.   Glori Bickers, MD  8:34  AM

## 2017-05-06 NOTE — Progress Notes (Addendum)
LVAD Coordinator Rounding Note:  Admitted 7/27/2018due to a possible GI bleed.   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 Prescott Gum.   Vital signs: HR: 73 vpaced Doppler Pressure: 68 Automatic BP: 102/65 (72) O2 Sat: 93 RA Wt: 224>221>217>215lbs  LVAD interrogation reveals:  Speed:5900 Flow: 5.6 Power: 4.6 PI: 3.2 Alarms: none Events: 10+ daily Fixed speed: 5900 Low speed limit: 5600 HCT: 29  Drive Line: changed 8/1. Next dressing change due 8/7.  Labs:  LDH trend: 156>167>158>161>155  INR trend: 3.01>2.35>1.59>1.47>1.28>1.34>1.46  HGB trend: 7.6>8.7>8 .9>8.2>8.7>8.6>8.8>9.0>9.3  Anticoagulation Plan: -INR Goal: 2-2.5 -ASA Dose: 81 mg  Adverse Events on VAD: -11/30/16> Milrinone gtt at discharge -01/27/17>dobutamine gtt at discharge  Blood products this admission:  04/30/17- 2 units PC  Gtts: Dobutamine 6 mcg Heparin 1700 units/hour   Plan/Recommendations:   1. Continue Heparin bridge. 2. Pt states he is planning to be discharged to his sister's house.     Tanda Rockers RN, VAD Coordinator 24/7 pager [phone]

## 2017-05-07 DIAGNOSIS — R040 Epistaxis: Secondary | ICD-10-CM

## 2017-05-07 LAB — CBC
HCT: 30.3 % — ABNORMAL LOW (ref 39.0–52.0)
HEMATOCRIT: 28.3 % — AB (ref 39.0–52.0)
HEMOGLOBIN: 9.6 g/dL — AB (ref 13.0–17.0)
Hemoglobin: 9.1 g/dL — ABNORMAL LOW (ref 13.0–17.0)
MCH: 25.8 pg — ABNORMAL LOW (ref 26.0–34.0)
MCH: 25.8 pg — ABNORMAL LOW (ref 26.0–34.0)
MCHC: 32.2 g/dL (ref 30.0–36.0)
MCHC: 31.7 g/dL (ref 30.0–36.0)
MCV: 80.2 fL (ref 78.0–100.0)
MCV: 81.5 fL (ref 78.0–100.0)
Platelets: 198 10*3/uL (ref 150–400)
Platelets: 182 10*3/uL (ref 150–400)
RBC: 3.53 MIL/uL — ABNORMAL LOW (ref 4.22–5.81)
RBC: 3.72 MIL/uL — AB (ref 4.22–5.81)
RDW: 21.9 % — AB (ref 11.5–15.5)
RDW: 22 % — ABNORMAL HIGH (ref 11.5–15.5)
WBC: 8.2 10*3/uL (ref 4.0–10.5)
WBC: 8 10*3/uL (ref 4.0–10.5)

## 2017-05-07 LAB — PROTIME-INR
INR: 1.87
PROTHROMBIN TIME: 21.8 s — AB (ref 11.4–15.2)

## 2017-05-07 LAB — BASIC METABOLIC PANEL
ANION GAP: 9 (ref 5–15)
BUN: 32 mg/dL — ABNORMAL HIGH (ref 6–20)
CALCIUM: 9 mg/dL (ref 8.9–10.3)
CO2: 29 mmol/L (ref 22–32)
Chloride: 88 mmol/L — ABNORMAL LOW (ref 101–111)
Creatinine, Ser: 1.54 mg/dL — ABNORMAL HIGH (ref 0.61–1.24)
GFR calc Af Amer: 55 mL/min — ABNORMAL LOW (ref >60)
GFR calc non Af Amer: 47 mL/min — ABNORMAL LOW (ref >60)
GLUCOSE: 193 mg/dL — AB (ref 65–99)
POTASSIUM: 4.7 mmol/L (ref 3.5–5.1)
Sodium: 126 mmol/L — ABNORMAL LOW (ref 135–145)

## 2017-05-07 LAB — GLUCOSE, CAPILLARY
GLUCOSE-CAPILLARY: 217 mg/dL — AB (ref 65–99)
Glucose-Capillary: 187 mg/dL — ABNORMAL HIGH (ref 65–99)
Glucose-Capillary: 214 mg/dL — ABNORMAL HIGH (ref 65–99)
Glucose-Capillary: 191 mg/dL — ABNORMAL HIGH (ref 65–99)

## 2017-05-07 LAB — COOXEMETRY PANEL
Carboxyhemoglobin: 1.8 % — ABNORMAL HIGH (ref 0.5–1.5)
Methemoglobin: 1.2 % (ref 0.0–1.5)
O2 Saturation: 73.6 %
TOTAL HEMOGLOBIN: 9.2 g/dL — AB (ref 12.0–16.0)

## 2017-05-07 LAB — LACTATE DEHYDROGENASE: LDH: 148 U/L (ref 98–192)

## 2017-05-07 LAB — HEPARIN LEVEL (UNFRACTIONATED): Heparin Unfractionated: 0.35 IU/mL (ref 0.30–0.70)

## 2017-05-07 MED ORDER — ALUM & MAG HYDROXIDE-SIMETH 200-200-20 MG/5ML PO SUSP
30.0000 mL | Freq: Four times a day (QID) | ORAL | Status: DC | PRN
  Administered 2017-05-07: 30 mL via ORAL
  Filled 2017-05-07: qty 30

## 2017-05-07 MED ORDER — WARFARIN SODIUM 7.5 MG PO TABS: 15.0000 mg | ORAL_TABLET | Freq: Once | ORAL | Status: DC

## 2017-05-07 MED ORDER — WARFARIN SODIUM 7.5 MG PO TABS
15.0000 mg | ORAL_TABLET | Freq: Once | ORAL | Status: AC
  Administered 2017-05-07: 15 mg via ORAL
  Filled 2017-05-07: qty 2

## 2017-05-07 NOTE — Progress Notes (Signed)
Pt c/o significant nausea and light-headedness.  States he feels like he's very drunk.  Restless; can't lay down or sit up without symptoms. PI 2.4 at time of this assessment; Hct adjusted per Hct 28.3 this AM.  Pt remains on Dobutamine gtt @ 19mcg/kg/min and Heparin gtt being discontinued per pharmacy order.  Dr. Aundra Dubin notified of pt status and states will come see pt.

## 2017-05-07 NOTE — Progress Notes (Signed)
Advanced Heart Failure VAD Team Note  Subjective:    He has received 2u PRBCs. Hgb 7.6-> 8.9->9.1. Had enteroscopy 7/31 with ulcers in his stomach. Started on carafate. Back on heparin and coumadin, INR 1.87 today.   He has continued on IV Lasix and metolazone.  Overnight, increased PI events with PI down as low as 2.9 this morning.  He feels dizzy and "drunk" when he stands up.  CVP 14-16. He remains on dobutamine 6, co-ox 74%.   Denies SOB.   LDH 155 => 148  LVAD INTERROGATION:  HeartMate 3 LVAD:  Flow 5.6  liters/min, speed 5900, power 4.7 PI 2.9    Objective:    Vital Signs:   Temp:  [97.9 F (36.6 C)-98.4 F (36.9 C)] 98.2 F (36.8 C) (08/04 0834) Pulse Rate:  [60-83] 65 (08/04 0834) Resp:  [16-20] 16 (08/04 0323) BP: (75-95)/(45-82) 90/70 (08/04 0900) SpO2:  [94 %-98 %] 95 % (08/04 0834) Weight:  [216 lb 7.9 oz (98.2 kg)] 216 lb 7.9 oz (98.2 kg) (08/04 0323) Last BM Date: 05/06/17 Mean arterial Pressure  70s   Intake/Output:   Intake/Output Summary (Last 24 hours) at 05/07/17 1003 Last data filed at 05/07/17 0900  Gross per 24 hour  Intake           1349.8 ml  Output             2700 ml  Net          -1350.2 ml     Physical Exam   Physical Exam: CVP ~14-16 GENERAL: Well appearing, NAD. In bed.  HEENT: normal  NECK: Supple, JVP 9-10 cm.  2+ bilaterally, no bruits.  No lymphadenopathy or thyromegaly appreciated.   CARDIAC:  Mechanical heart sounds with LVAD hum present. R upper chest tunneled PICC LUNGS:  Clear to auscultation bilaterally.  ABDOMEN:  Soft, round, nontender, positive bowel sounds x4.     LVAD exit site: well-healed and incorporated.  Dressing dry and intact.  No erythema or drainage.  Stabilization device present and accurately applied.  Driveline dressing is being changed daily per sterile technique. EXTREMITIES:  Warm and dry, no cyanosis, clubbing, rash or edema  NEUROLOGIC:  Alert and oriented x 4.  Gait steady.  No aphasia.  No  dysarthria.  Affect pleasant.   Telemetry   Personally reviewed, NSR.    EKG    N/A  Labs   Basic Metabolic Panel:  Recent Labs Lab 05/04/17 0537 05/04/17 0829 05/04/17 1700 05/04/17 2345 05/05/17 0240 05/06/17 0455 05/07/17 0414  NA 124* 125* 128* 128* 128* 129* 126*  K 3.8 4.1  --   --  4.3 4.2 4.7  CL 87* 86*  --   --  90* 90* 88*  CO2 31 31  --   --  29 28 29   GLUCOSE 143* 138*  --   --  146* 177* 193*  BUN 22* 19  --   --  24* 25* 32*  CREATININE 1.23 1.21  --   --  1.23 1.31* 1.54*  CALCIUM 8.6* 9.0  --   --  9.0 9.2 9.0    Liver Function Tests: No results for input(s): AST, ALT, ALKPHOS, BILITOT, PROT, ALBUMIN in the last 168 hours. No results for input(s): LIPASE, AMYLASE in the last 168 hours. No results for input(s): AMMONIA in the last 168 hours.  CBC:  Recent Labs Lab 05/03/17 0330 05/04/17 0537 05/05/17 0240 05/06/17 0455 05/07/17 0414  WBC 6.7 5.6 6.2 6.4 8.2  HGB 8.6* 8.8* 9.0* 9.3* 9.1*  HCT 27.4* 28.2* 28.9* 29.7* 28.3*  MCV 79.0 79.7 80.5 81.6 80.2  PLT 214 203 215 210 198    INR:  Recent Labs Lab 05/03/17 0330 05/04/17 0537 05/05/17 0240 05/06/17 0618 05/07/17 0414  INR 1.47 1.28 1.34 1.46 1.87    Other results:     Imaging   No results found.   Medications:     Scheduled Medications: . amiodarone  200 mg Oral Daily  . citalopram  20 mg Oral Daily  . digoxin  0.125 mg Oral Daily  . docusate sodium  200 mg Oral BID  . gabapentin  200 mg Oral BID  . insulin aspart  0-15 Units Subcutaneous TID WC  . insulin aspart  0-5 Units Subcutaneous QHS  . insulin glargine  20 Units Subcutaneous QHS  . losartan  25 mg Oral Daily  . magnesium oxide  400 mg Oral Daily  . pantoprazole  40 mg Oral BID AC  . polyethylene glycol  17 g Oral Daily  . potassium chloride  40 mEq Oral BID  . senna-docusate  2 tablet Oral Daily  . sildenafil  40 mg Oral TID  . spironolactone  25 mg Oral Daily  . sucralfate  1 g Oral TID WC & HS   . warfarin  15 mg Oral ONCE-1800  . Warfarin - Pharmacist Dosing Inpatient   Does not apply q1800    Infusions: . sodium chloride 20 mL/hr at 05/02/17 1652  . DOBUTamine 6 mcg/kg/min (05/06/17 2027)  . ferumoxytol      PRN Medications: acetaminophen, clonazePAM, ondansetron (ZOFRAN) IV, ondansetron, oxymetazoline, traMADol, zolpidem   Patient Profile   Ronald Miller.is a 61 y.o.male with history of chronic systolic HF s/p Medtronic ICD 2014, CAD s/p CABG in 2000, HTN, Hx of cocaine abuse, Tobacco abuse, Depression, PAF, and COPD. HM3 placed 09/09/2016.   Presented to clinic 04/29/17 for follow up. Hgb down, INR up. Reports dark black stools and constipation. Admitted for treatment and further evaluation.    Assessment/Plan:    1. Acute on chronic anemia with Melena: Likely acute upper GI bleed.  1 week of black stools with symptoms and drop in Hgb.- Iron low. Received Feraheme on 7/28.  Now s/p 2u RBCs.  Had scope with ulcers in stomach. Continue carafate and continue PPI twice a day. Hgb stable today.  - No aspirin. INR goal 2-2.5.  INR 1.87 today, can stop heparin gtt.  - Transfuse hgb < 8.  2. Acute/Chronic end-stage biventricular systolic TK:WIOX 73% due to ICM ->s/p Echo 08/24/16 LVEF 15%, RV mild dilated, moderately reduced. -->S/P HM3 LVAD 09/09/2016. Complicated by RV failure. Initially on milrinone but stopped due to low mixed venous saturation, remains on home dobutamine 65mcg/kg/min. Today's CO-OX is 74%.  Continues to struggle with severe RV failure.He was diuresed this admission aggressively, weight is down 10 lbs.  He still has JVD with CVP 14-16, but he is lightheaded this morning with PI lower (2.9).  Suspect we have diuresed him beyond what his RV will tolerate and will have to allow a bit more preload.  - Hold diuresis today (no IV Lasix or metolazone) but would avoid giving back fluid. If doing better tomorrow, back to po torsemide.   - Continue losartan 25  mg daily and spironolactone, MAP remains in 70s.  - No beta blocker with RV failure - Digoxin level elevated. Dose was cut back to 0.125 mcg daily. Check dig level again  as an outpatient.  - Continue dobutamine at 6.  - Continue Revatio 40 tid.  3. CAD: No CP. 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 No chest pain.  4. DMII: Continue scheduled insulin and SSI 5. Tobacco/substance abuse: Currently not using 6. Atrial fibrillation, paroxysmal: In NSR  7. Anxiety/Depression: Continue current dose of celexa and klonipin.   8.H/O RUE DVT: Back on coumadin.   9. Hyponatremia: Sodium lower today at 126.  Holding metolazone/Lasix. Would not push fluid too hard with tendency towards hyponatremia.  10. CKD stage 3: Creatinine mildly higher to 1.5 today with diuresis. Holding diuretics today.  11. Epistaxis: Refused ENT consult.  Resolved  He plans to go to home with his sister. He will need [facility] for home dobutamine 6 mcg. He has used [facility] in the past.  Order placed [facility] .   I reviewed the LVAD parameters from today, and compared the results to the patient's prior recorded data.  No programming changes were made.  The LVAD is functioning within specified parameters.  The patient performs LVAD self-test daily.  LVAD interrogation was negative for any significant power changes, alarms or PI events/speed drops.  LVAD equipment check completed and is in good working order.  Back-up equipment present.   LVAD education done on emergency procedures and precautions and reviewed exit site care.  Length of Stay: 8  Loralie Champagne, MD 05/07/2017, 10:03 AM  VAD Team --- VAD ISSUES ONLY--- Pager [phone] (7am - 7am)  Advanced Heart Failure Team  Pager [phone] (M-F; 7a - 4p)  Please contact [facility] for night-coverage after hours (4p -7a ) and weekends on [email or web address]

## 2017-05-07 NOTE — Progress Notes (Signed)
Received a page from bedside nurse stating that the pt has a nosebleed and that he vomited moderate amount of blood. Nurse also states that the pt is c/o he is dizzy. All VAD numbers were stable. Diuretics were held this morning per Dr. Aundra Dubin. Instructions were given to send a stat CBC, hold coumadin tonight, consult GI and treat Nausea.   GI was called and asked to reconsult.. GI doc stated that they will see the pt today or tomorrow. GI states that the blood may be coming from the persistent nosebleeds. Per GI H pylori is negative. Wewiil notify GI if hgb is less than 7.   Please continue to monitor pt closely. Report hgb less than 8 to VAD Coordinator.

## 2017-05-07 NOTE — Progress Notes (Signed)
ANTICOAGULATION CONSULT NOTE - Follow Up Consult  Pharmacy Consult for Heparin > warfarin Indication: atrial fibrillation  Allergies  Allergen Reactions  . Codeine Nausea And Vomiting and Other (See Comments)    In high doses  . Trazodone And Nefazodone Other (See Comments)    Dizziness with subsequent fall   . Lipitor [Atorvastatin] Nausea Only and Other (See Comments)    Nausea with high doses, tolerates 20mg  dose (08/22/16)  . Tape Other (See Comments)    Paper tape is ok    Patient Measurements: Heparin Dosing Weight: 81.5 kg  Vital Signs: Temp: 98 F (36.7 C) (08/04 0323) Temp Source: Oral (08/04 0323) BP: 78/64 (08/04 0323) Pulse Rate: 62 (08/04 0323)  Labs:  Recent Labs  05/05/17 0240 05/05/17 0914 05/06/17 0455 05/06/17 0618 05/07/17 0414  HGB 9.0*  --  9.3*  --  9.1*  HCT 28.9*  --  29.7*  --  28.3*  PLT 215  --  210  --  198  LABPROT 16.7*  --   --  17.8* 21.8*  INR 1.34  --   --  1.46 1.87  HEPARINUNFRC 0.24* 0.30  --  0.33 0.35  CREATININE 1.23  --  1.31*  --  1.54*    Estimated Creatinine Clearance: 53 mL/min (A) (by C-G formula based on SCr of 1.54 mg/dL (H)).  Assessment: 3 yoM with end-stage heart failure (LVAD placement in 09/2016) on outpatient Dobutamione drip 63mcg/kg/min  for RV failure coox 60s -  with digoxin 0.25 level 1.1 dose dropped 0.168mcg qd, and sildenafil 40tid, Losartan 25  MAPs stable 70-80s  On coumadin PTA 15mg  TTS/10mg  MWFSu admit INR 3.7 -  admitted with HGB drop and melena> s/p 2 uts PRBC slight increase in h/h post transfusion.  Held warfarin for GI work up> s/p EGD with ulcerative changes but not active bleeding> begin sucralfate and ppi po and ok to restart Coumadin.  Heparin level at goal on 1700 units/hr.  Had nosebleeds 8/1, but now resolved. Pt refused ENT intervention. INR rising slowly now up to 1.87 after re-initiation of Coumadin 7/30.   Goal of Therapy:  Heparin level 0.3-0.5 INR 2-2.5 Monitor platelets by  anticoagulation protocol: Yes   Plan:  Warfarin 15 mg again tonight. Continue IV heparin at 1700 units/hr. Daily heparin level, CBC and INR.  Erin Hearing PharmD., BCPS Clinical Pharmacist Pager [phone] 05/07/2017 8:12 AM

## 2017-05-07 NOTE — Progress Notes (Signed)
Received a page from bedside nurse stating pt is dizzy and nausea. VAD parameters are normal per his trends. MAP is 76. PI is 2.4 but pt is a HM3-PI is not an indicator of fluid status with the 3. Per Dr. Aundra Dubin treat pts nausea and vomiting with meds. Dr. Aundra Dubin will reasses pt in the morning for fluid status. GI saw the pt today and recommended ENT consult.

## 2017-05-07 NOTE — Progress Notes (Signed)
Pt incontinent of stool x 1; small amount of blood clot noted in formed stool but only in one location.  Pt continues to dry heave but no additional blood being spit up at this time.  Epistaxis stopped w/nasal packing.  CBC drawn and sent to lab.  Sarah, [place] Coordinator, called to notify that GI Consult will most likely be seeing patient tomorrow; call if Hgb less than 8.0.  Will continue to monitor closely.

## 2017-05-07 NOTE — Progress Notes (Signed)
Pt now spitting up blood and w/epistaxis.  Not large amounts but pretty continuous.  Pt states he's very lightheaded still and feels like he is about to pass out.  VSS.  Paged VAD Coordinator, Clarise Cruz - reviewed plan of care, new orders per Dr. Aundra Dubin this AM, labs, pt status. Clarise Cruz stated she would reach out to Dr. Aundra Dubin and call back.  Zofran IV PRN administered along w/Afrin

## 2017-05-07 NOTE — Progress Notes (Signed)
Called to instruct nurse to make sure pt gets his Coumadin tonight per Dr. Aundra Dubin. Earlier today we held it because pt was having nosebleeds and vomiting blood, but following hgb of 9.6 Dr. Aundra Dubin would like pt to receive his Coumadin tonight. Nurse verbalized understanding of this change.

## 2017-05-07 NOTE — Progress Notes (Signed)
GI at bedside for consult; recommend consult to ENT for recurrent nosebleeds; will follow but do not anticipate recurrent GI bleed at this time.  Hgb stable and improved at 9.6; no further bleeding or spitting up blood since epistaxis resolved.  Pt remains NPO for the evening.  Updated Sarah, [place] Coordinator, and ENT consult placed per Dr. Aundra Dubin, MD for recurrent epistaxis, anticoagulated patient, recent GI bleed.  Will continue to monitor.

## 2017-05-07 NOTE — Progress Notes (Signed)
[facility] Progress Note  Chief Complaint:    Blood in stool  Subjective: Awoke him from sleep. Coughed today and had recurrent nosebleed. Regurgitated blood.   Objective:  Vital signs in last 24 hours: Temp:  [97.6 F (36.4 C)-98.4 F (36.9 C)] 97.6 F (36.4 C) (08/04 1120) Pulse Rate:  [56-65] 56 (08/04 1120) Resp:  [16-20] 16 (08/04 0323) BP: (75-90)/(45-72) 86/72 (08/04 1200) SpO2:  [94 %-98 %] 96 % (08/04 1120) Weight:  [216 lb 7.9 oz (98.2 kg)] 216 lb 7.9 oz (98.2 kg) (08/04 0323) Last BM Date: 05/07/17 General:   Alert, well-developed, white male in NAD EENT:  Normal hearing, non icteric sclera, conjunctive pink.  Heart:  LVAD humm Pulm: Normal respiratory effor Abdomen:  Soft, nondistended, nontender.  Normal bowel sounds, no masses felt. No hepatomegaly.    Neurologic:  Alert and  oriented x4;  grossly normal neurologically. Psych:  Pleasant, cooperative.  Normal mood and affect.   Intake/Output from previous day: 08/03 0701 - 08/04 0700 In: 1538.8 [P.O.:822; I.V.:716.8] Out: 2275 [Urine:2275] Intake/Output this shift: Total I/O In: 702.6 [P.O.:600; I.V.:102.6] Out: 600 [Urine:600]  Lab Results:  Recent Labs  05/05/17 0240 05/06/17 0455 05/07/17 0414  WBC 6.2 6.4 8.2  HGB 9.0* 9.3* 9.1*  HCT 28.9* 29.7* 28.3*  PLT 215 210 198   BMET  Recent Labs  05/05/17 0240 05/06/17 0455 05/07/17 0414  NA 128* 129* 126*  K 4.3 4.2 4.7  CL 90* 90* 88*  CO2 29 28 29   GLUCOSE 146* 177* 193*  BUN 24* 25* 32*  CREATININE 1.23 1.31* 1.54*  CALCIUM 9.0 9.2 9.0   PT/INR  Recent Labs  05/06/17 0618 05/07/17 0414  LABPROT 17.8* 21.8*  INR 1.46 1.87    ASSESSMENT / PLAN:   1. 61 yo male with recent UGIB on coumadin. Enteroscopy remarkable for antral ulcerations + hemorrhage.  Bx  c/w mild, chronic inactive gastritis. No h.pylori. Called back 7/31 for concern of recurrent bleeding but turned out to be epistaxis. Asked to see patient  again today after quarter size blood clot seen in otherwise normal brown BM. Marland Kitchen He is up to date on colonoscopy, last one November 2017 screening colonoscopy. Exam complete with good prep. Findings. A few scattered tics, internal hemorrhoids -suspect he swallowed blood from nosebleed today. No melena. Stools brown, the small blood clot was probably hemorrhoidal. His hgb has actually come up. No need to repeat endoscopy at this point.  -Recommend ENT consult for recurrent epistaxis.   2. Complex cardiac hx including PAF on amiodarone, CAD s/p CABG, cardiomyopathy s/p LVAD.   3. Acute on chronic anemia, got 2uprbc on 7/28. Hgb has since remained stable around 9.2.  CBC pending now.   4. AKI. Cr 1.31 >> 1.54   Principal Problem:   GI bleed Active Problems:   Acute on chronic systolic (congestive) heart failure (HCC)   Acute upper GI bleed   Iron deficiency anemia   Constipation    LOS: 8 days   Tye Savoy ,NP 05/07/2017, 2:54 PM  Pager number [phone]    Attending physician's note   I have taken an interval history, reviewed the chart and examined the patient. I agree with the Advanced Practitioner's note, impression and recommendations.  He is having recurrent episode of epistaxis, and might have swallowed and regurgitated the blood or coughed up from posterior pharynx.  Enteroscopy with gastritis and superficial gastric ulcers/erosions, biopsies negative for H.pylori, metaplasia or dysplasia.  Repeat  EGD or enteroscopy unlikely to provide additional information and superficial gastric erosions/ulcers with no stigmata and do not need endoscopic therapeutic intervention Continue PPI Consider ENT consult to manage epistaxis Will sign off, but available for any questions  K Denzil Magnuson, MD [phone] Mon-Fri 8a-5p [phone] after 5p, weekends, holidays

## 2017-05-08 LAB — PROTIME-INR
INR: 2
Prothrombin Time: 23 seconds — ABNORMAL HIGH (ref 11.4–15.2)

## 2017-05-08 LAB — GLUCOSE, CAPILLARY
GLUCOSE-CAPILLARY: 180 mg/dL — AB (ref 65–99)
GLUCOSE-CAPILLARY: 271 mg/dL — AB (ref 65–99)
Glucose-Capillary: 179 mg/dL — ABNORMAL HIGH (ref 65–99)
Glucose-Capillary: 225 mg/dL — ABNORMAL HIGH (ref 65–99)

## 2017-05-08 LAB — COOXEMETRY PANEL
CARBOXYHEMOGLOBIN: 2 % — AB (ref 0.5–1.5)
Carboxyhemoglobin: 2 % — ABNORMAL HIGH (ref 0.5–1.5)
METHEMOGLOBIN: 1.1 % (ref 0.0–1.5)
Methemoglobin: 1 % (ref 0.0–1.5)
O2 Saturation: 48.2 %
O2 Saturation: 53.1 %
Total hemoglobin: 9.1 g/dL — ABNORMAL LOW (ref 12.0–16.0)
Total hemoglobin: 9.8 g/dL — ABNORMAL LOW (ref 12.0–16.0)

## 2017-05-08 LAB — CBC
HEMATOCRIT: 28.9 % — AB (ref 39.0–52.0)
HEMOGLOBIN: 9 g/dL — AB (ref 13.0–17.0)
MCH: 25.4 pg — ABNORMAL LOW (ref 26.0–34.0)
MCHC: 31.1 g/dL (ref 30.0–36.0)
MCV: 81.6 fL (ref 78.0–100.0)
Platelets: 161 10*3/uL (ref 150–400)
RBC: 3.54 MIL/uL — ABNORMAL LOW (ref 4.22–5.81)
RDW: 21.9 % — ABNORMAL HIGH (ref 11.5–15.5)
WBC: 8.1 10*3/uL (ref 4.0–10.5)

## 2017-05-08 LAB — BASIC METABOLIC PANEL
Anion gap: 7 (ref 5–15)
BUN: 26 mg/dL — ABNORMAL HIGH (ref 6–20)
CHLORIDE: 91 mmol/L — AB (ref 101–111)
CO2: 29 mmol/L (ref 22–32)
Calcium: 9.3 mg/dL (ref 8.9–10.3)
Creatinine, Ser: 1.33 mg/dL — ABNORMAL HIGH (ref 0.61–1.24)
GFR calc non Af Amer: 57 mL/min — ABNORMAL LOW (ref >60)
Glucose, Bld: 150 mg/dL — ABNORMAL HIGH (ref 65–99)
Potassium: 5.4 mmol/L — ABNORMAL HIGH (ref 3.5–5.1)
Sodium: 127 mmol/L — ABNORMAL LOW (ref 135–145)

## 2017-05-08 LAB — LACTATE DEHYDROGENASE: LDH: 150 U/L (ref 98–192)

## 2017-05-08 MED ORDER — TORSEMIDE 100 MG PO TABS
100.0000 mg | ORAL_TABLET | Freq: Two times a day (BID) | ORAL | Status: DC
  Administered 2017-05-08 – 2017-05-09 (×3): 100 mg via ORAL
  Filled 2017-05-08 (×4): qty 1

## 2017-05-08 MED ORDER — WARFARIN SODIUM 7.5 MG PO TABS
15.0000 mg | ORAL_TABLET | Freq: Once | ORAL | Status: AC
  Administered 2017-05-08: 15 mg via ORAL
  Filled 2017-05-08: qty 2

## 2017-05-08 MED ORDER — TORSEMIDE 20 MG PO TABS
100.0000 mg | ORAL_TABLET | Freq: Two times a day (BID) | ORAL | Status: DC
  Administered 2017-05-08: 100 mg via ORAL
  Filled 2017-05-08 (×2): qty 5

## 2017-05-08 NOTE — Progress Notes (Signed)
ANTICOAGULATION CONSULT NOTE - Follow Up Consult  Pharmacy Consult for warfarin Indication: atrial fibrillation  Patient Measurements: Heparin Dosing Weight: 81.5 kg  Vital Signs: Temp: 98 F (36.7 C) (08/05 0342) Temp Source: Oral (08/05 0342) BP: 92/66 (08/05 0342) Pulse Rate: 63 (08/05 0342)  Labs:  Recent Labs  05/05/17 0914  05/06/17 0455 05/06/17 0618 05/07/17 0414 05/07/17 1439 05/08/17 0358  HGB  --   < > 9.3*  --  9.1* 9.6* 9.0*  HCT  --   < > 29.7*  --  28.3* 30.3* 28.9*  PLT  --   < > 210  --  198 182 161  LABPROT  --   --   --  17.8* 21.8*  --  23.0*  INR  --   --   --  1.46 1.87  --  2.00  HEPARINUNFRC 0.30  --   --  0.33 0.35  --   --   CREATININE  --   --  1.31*  --  1.54*  --  1.33*  < > = values in this interval not displayed.  Estimated Creatinine Clearance: 61.1 mL/min (A) (by C-G formula based on SCr of 1.33 mg/dL (H)).  Assessment: 13 yoM with end-stage heart failure (LVAD placement in 09/2016) on outpatient Dobutamione drip 57mcg/kg/min  for RV failure coox 60s -  with digoxin 0.25 level 1.1 dose dropped 0.169mcg qd, and sildenafil 40tid, Losartan 25  MAPs stable 70-80s  On coumadin PTA 15mg  TTS/10mg  MWFSu admit INR 3.7 -  admitted with HGB drop and melena> s/p 2 uts PRBC slight increase in h/h post transfusion.  Held warfarin for GI work up> s/p EGD with ulcerative changes but not active bleeding> begin sucralfate and ppi po and ok to restart Coumadin.  Heparin off yesterday morning, later that afternoon developed epistaxis and blood noted in stool. GI re-consulted and have no new recommendations regarding procedures, they did suggest ENT consult which is pending. Hgb stable last night and warfarin was given.  Hgb stable this am. No further reports of epistaxis, but plt count trending down. INR now at goal at 2 will continue warfarin for now.  Goal of Therapy:  INR 2-2.5 Monitor platelets by anticoagulation protocol: Yes   Plan:  Warfarin 15 mg  again tonight. Follow ENT recommendations Daily heparin level, CBC and INR.  Erin Hearing PharmD., BCPS Clinical Pharmacist Pager [phone] 05/08/2017 7:37 AM

## 2017-05-08 NOTE — Progress Notes (Signed)
Pt c/o dizziness, nauseas, lightheaded belching.  Gave pt zofran 4mg , then gave maalox.  Notified LVAD coordinator.  Will continue to monitor. Ronald Miller

## 2017-05-08 NOTE — Progress Notes (Signed)
Patient ID: Ronald Miller., male   DOB: July 11, 1956, 61 y.o.   MRN: [id number]   Advanced Heart Failure VAD Team Note  Subjective:    He has received 2u PRBCs. Hgb 7.6-> 8.9->9.1->9. Had enteroscopy 7/31 with ulcers in his stomach. Started on carafate. INR 2 today, off heparin gtt.    Yesterday, he had multiple PI events and felt "drunk" with standing.  I stopped IV Lasix and metolazone.  Today, not dizzy.  No PI events overnight or today.  Creatinine down to 1.33.  CVP 14 today but co-ox low at 48%.   Denies SOB.   No further epistaxis.   LDH 155 => 148 => 150  LVAD INTERROGATION:  HeartMate 3 LVAD:  Flow 5.5  liters/min, speed 5900, power 4.6 PI 2.9  No PI events overnight (multiple until around mid-day yesterday)  Objective:    Vital Signs:   Temp:  [97.5 F (36.4 C)-98.2 F (36.8 C)] 97.7 F (36.5 C) (08/05 0753) Pulse Rate:  [56-63] 60 (08/05 0753) Resp:  [16-19] 19 (08/05 0752) BP: (83-98)/(65-84) 97/84 (08/05 0752) SpO2:  [92 %-97 %] 92 % (08/05 0753) Weight:  [214 lb 9.6 oz (97.3 kg)] 214 lb 9.6 oz (97.3 kg) (08/05 0351) Last BM Date: 05/07/17 Mean arterial Pressure  70s-80s   Intake/Output:   Intake/Output Summary (Last 24 hours) at 05/08/17 0948 Last data filed at 05/08/17 0600  Gross per 24 hour  Intake           904.33 ml  Output             1050 ml  Net          -145.67 ml     Physical Exam   Physical Exam: CVP 14 GENERAL: Well appearing, NAD. In bed.  HEENT: normal  NECK: Supple, JVP 9-10 cm. 2+ bilaterally, no bruits. No lymphadenopathy or thyromegaly appreciated.  CARDIAC: Mechanical heart sounds with LVAD hum present. R upper chest tunneled PICC LUNGS: Clear to auscultation bilaterally.  ABDOMEN: Soft, round, nontender, positive bowel sounds x4.  LVAD exit site: well-healed and incorporated. Dressing dry and intact. No erythema or drainage. Stabilization device present and accurately applied. Driveline dressing is being changed  daily per sterile technique. EXTREMITIES: Warm and dry, no cyanosis, clubbing, rash or edema  NEUROLOGIC: Alert and oriented x 4. Gait steady. No aphasia. No dysarthria. Affect pleasant.   Telemetry   Personally reviewed, NSR.    EKG    N/A  Labs   Basic Metabolic Panel:  Recent Labs Lab 05/04/17 0829  05/04/17 2345 05/05/17 0240 05/06/17 0455 05/07/17 0414 05/08/17 0358  NA 125*  < > 128* 128* 129* 126* 127*  K 4.1  --   --  4.3 4.2 4.7 5.4*  CL 86*  --   --  90* 90* 88* 91*  CO2 31  --   --  29 28 29 29   GLUCOSE 138*  --   --  146* 177* 193* 150*  BUN 19  --   --  24* 25* 32* 26*  CREATININE 1.21  --   --  1.23 1.31* 1.54* 1.33*  CALCIUM 9.0  --   --  9.0 9.2 9.0 9.3  < > = values in this interval not displayed.  Liver Function Tests: No results for input(s): AST, ALT, ALKPHOS, BILITOT, PROT, ALBUMIN in the last 168 hours. No results for input(s): LIPASE, AMYLASE in the last 168 hours. No results for input(s): AMMONIA in the last 168  hours.  CBC:  Recent Labs Lab 05/05/17 0240 05/06/17 0455 05/07/17 0414 05/07/17 1439 05/08/17 0358  WBC 6.2 6.4 8.2 8.0 8.1  HGB 9.0* 9.3* 9.1* 9.6* 9.0*  HCT 28.9* 29.7* 28.3* 30.3* 28.9*  MCV 80.5 81.6 80.2 81.5 81.6  PLT 215 210 198 182 161    INR:  Recent Labs Lab 05/04/17 0537 05/05/17 0240 05/06/17 0618 05/07/17 0414 05/08/17 0358  INR 1.28 1.34 1.46 1.87 2.00    Other results:     Imaging   No results found.   Medications:     Scheduled Medications: . amiodarone  200 mg Oral Daily  . citalopram  20 mg Oral Daily  . digoxin  0.125 mg Oral Daily  . docusate sodium  200 mg Oral BID  . gabapentin  200 mg Oral BID  . insulin aspart  0-15 Units Subcutaneous TID WC  . insulin aspart  0-5 Units Subcutaneous QHS  . insulin glargine  20 Units Subcutaneous QHS  . losartan  25 mg Oral Daily  . magnesium oxide  400 mg Oral Daily  . pantoprazole  40 mg Oral BID AC  . polyethylene glycol  17 g  Oral Daily  . senna-docusate  2 tablet Oral Daily  . sildenafil  40 mg Oral TID  . spironolactone  25 mg Oral Daily  . sucralfate  1 g Oral TID WC & HS  . torsemide  100 mg Oral BID  . warfarin  15 mg Oral ONCE-1800  . Warfarin - Pharmacist Dosing Inpatient   Does not apply q1800    Infusions: . sodium chloride 20 mL/hr at 05/02/17 1652  . DOBUTamine 6 mcg/kg/min (05/07/17 2035)    PRN Medications: acetaminophen, alum & mag hydroxide-simeth, clonazePAM, ondansetron (ZOFRAN) IV, ondansetron, oxymetazoline, traMADol, zolpidem   Patient Profile   Ronald Milleris a 61 y.o.male with history of chronic systolic HF s/p Medtronic ICD 2014, CAD s/p CABG in 2000, HTN, Hx of cocaine abuse, Tobacco abuse, Depression, PAF, and COPD. HM3 placed 09/09/2016.   Presented to clinic 04/29/17 for follow up. Hgb down, INR up. Reports dark black stools and constipation. Admitted for treatment and further evaluation.    Assessment/Plan:    1. Acute on chronic anemia with melena: Likely acute upper GI bleed.  1 week of black stools with symptoms and drop in Hgb.- Iron low. Received Feraheme on 7/28.  Now s/p 2u RBCs.  Had scope with ulcers in stomach. Continue carafate and continue PPI twice a day. Hgb stable today.  - No aspirin. INR goal 2-2.5.  INR 2 today on warfarin.  - Transfuse hgb < 8.  2. Acute/Chronic end-stage biventricular systolic VZ:DGLO 75% due to ICM ->s/p Echo 08/24/16 LVEF 15%, RV mild dilated, moderately reduced. -->S/P HM3 LVAD 09/09/2016. Complicated by RV failure. Initially on milrinone but stopped due to low mixed venous saturation, remains on home dobutamine 30mcg/kg/min. Today's CO-OX is 74%.  Continues to struggle with severe RV failure.He was diuresed this admission aggressively, weight is down 12 lbs.  CVP remains 14 but had lots of PI events and dizziness yesterday.  Suspect we have diuresed him beyond what his RV will tolerate and will have to allow a bit more preload.   No PI events overnight, not dizzy.  Co-ox low at 48% today.  - With low co-ox, will increase dobutamine to 7.5. Repeat co-ox later today.  - Restart torsemide 100 mg bid.  - Continue losartan 25 mg daily and spironolactone, MAP remains in  70s-80s.  - No beta blocker with RV failure - Digoxin level elevated. Dose was cut back to 0.125 mcg daily. Check dig level again tomorrow.   - Continue Revatio 40 tid.  3. CAD: No CP. 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 No chest pain.  4. DMII: Continue scheduled insulin and SSI 5. Tobacco/substance abuse: Currently not using 6. Atrial fibrillation, paroxysmal: In NSR  7. Anxiety/Depression: Continue current dose of celexa and klonipin.   8.H/O RUE DVT: Back on coumadin.   9. Hyponatremia: Sodium slightly higher today at 127.  Fluid restrict.  10. CKD stage 3: Creatinine lower today.   11. Epistaxis: Refused ENT consult.  Seems to have resolved.   He plans to go to home with his sister. He will need [facility] for home dobutamine probably 7.5 mcg/kg/min. He has used [facility] in the past.    I reviewed the LVAD parameters from today, and compared the results to the patient's prior recorded data.  No programming changes were made.  The LVAD is functioning within specified parameters.  The patient performs LVAD self-test daily.  LVAD interrogation was negative for any significant power changes, alarms or PI events/speed drops.  LVAD equipment check completed and is in good working order.  Back-up equipment present.   LVAD education done on emergency procedures and precautions and reviewed exit site care.  Length of Stay: 23  Loralie Champagne, MD 05/08/2017, 9:48 AM  VAD Team --- VAD ISSUES ONLY--- Pager [phone] (7am - 7am)  Advanced Heart Failure Team  Pager [phone] (M-F; 7a - 4p)  Please contact [facility] for night-coverage after hours (4p -7a ) and weekends on [email or web address]

## 2017-05-09 ENCOUNTER — Other Ambulatory Visit (HOSPITAL_COMMUNITY): Payer: Medicare HMO

## 2017-05-09 ENCOUNTER — Other Ambulatory Visit (HOSPITAL_COMMUNITY): Payer: Self-pay | Admitting: Medical

## 2017-05-09 ENCOUNTER — Ambulatory Visit: Payer: Self-pay

## 2017-05-09 LAB — BASIC METABOLIC PANEL
ANION GAP: 9 (ref 5–15)
BUN: 30 mg/dL — ABNORMAL HIGH (ref 6–20)
CHLORIDE: 89 mmol/L — AB (ref 101–111)
CO2: 31 mmol/L (ref 22–32)
Calcium: 8.7 mg/dL — ABNORMAL LOW (ref 8.9–10.3)
Creatinine, Ser: 1.45 mg/dL — ABNORMAL HIGH (ref 0.61–1.24)
GFR, EST AFRICAN AMERICAN: 59 mL/min — AB (ref >60)
GFR, EST NON AFRICAN AMERICAN: 51 mL/min — AB (ref >60)
Glucose, Bld: 170 mg/dL — ABNORMAL HIGH (ref 65–99)
POTASSIUM: 3.6 mmol/L (ref 3.5–5.1)
SODIUM: 129 mmol/L — AB (ref 135–145)

## 2017-05-09 LAB — PROTIME-INR
INR: 2.46
PROTHROMBIN TIME: 27.1 s — AB (ref 11.4–15.2)

## 2017-05-09 LAB — COOXEMETRY PANEL
CARBOXYHEMOGLOBIN: 1.6 % — AB (ref 0.5–1.5)
Methemoglobin: 1.4 % (ref 0.0–1.5)
O2 Saturation: 58.3 %
Total hemoglobin: 9.3 g/dL — ABNORMAL LOW (ref 12.0–16.0)

## 2017-05-09 LAB — GLUCOSE, CAPILLARY
GLUCOSE-CAPILLARY: 162 mg/dL — AB (ref 65–99)
GLUCOSE-CAPILLARY: 173 mg/dL — AB (ref 65–99)

## 2017-05-09 LAB — DIGOXIN LEVEL: DIGOXIN LVL: 0.7 ng/mL — AB (ref 0.8–2.0)

## 2017-05-09 LAB — LACTATE DEHYDROGENASE: LDH: 151 U/L (ref 98–192)

## 2017-05-09 LAB — CBC
HCT: 29.7 % — ABNORMAL LOW (ref 39.0–52.0)
HEMOGLOBIN: 9.4 g/dL — AB (ref 13.0–17.0)
MCH: 25.6 pg — AB (ref 26.0–34.0)
MCHC: 31.6 g/dL (ref 30.0–36.0)
MCV: 80.9 fL (ref 78.0–100.0)
PLATELETS: 169 10*3/uL (ref 150–400)
RBC: 3.67 MIL/uL — AB (ref 4.22–5.81)
RDW: 21.5 % — ABNORMAL HIGH (ref 11.5–15.5)
WBC: 8.4 10*3/uL (ref 4.0–10.5)

## 2017-05-09 MED ORDER — SUCRALFATE 1 GM/10ML PO SUSP: 1.0000 g | Freq: Three times a day (TID) | ORAL | 6 refills | Status: AC

## 2017-05-09 MED ORDER — CITALOPRAM HYDROBROMIDE 20 MG PO TABS: 20.0000 mg | ORAL_TABLET | Freq: Every day | ORAL | 6 refills | Status: AC

## 2017-05-09 MED ORDER — INSULIN DETEMIR 100 UNIT/ML ~~LOC~~ SOLN: 20.0000 [IU] | Freq: Every day | SUBCUTANEOUS | 11 refills | Status: DC

## 2017-05-09 MED ORDER — INSULIN GLARGINE 100 UNIT/ML SOLOSTAR PEN: 20.0000 [IU] | PEN_INJECTOR | Freq: Every day | SUBCUTANEOUS | 11 refills | Status: DC

## 2017-05-09 MED ORDER — CLONAZEPAM 0.25 MG PO TBDP: 0.2500 mg | ORAL_TABLET | Freq: Two times a day (BID) | ORAL | 0 refills | Status: DC

## 2017-05-09 MED ORDER — INSULIN ASPART 100 UNIT/ML FLEXPEN: 5.0000 [IU] | PEN_INJECTOR | Freq: Three times a day (TID) | SUBCUTANEOUS | 11 refills | Status: DC

## 2017-05-09 MED ORDER — WARFARIN SODIUM 10 MG PO TABS
10.0000 mg | ORAL_TABLET | Freq: Once | ORAL | Status: AC
  Administered 2017-05-09: 10 mg via ORAL
  Filled 2017-05-09: qty 1

## 2017-05-09 MED ORDER — AMIODARONE HCL 200 MG PO TABS: 200.0000 mg | ORAL_TABLET | Freq: Every day | ORAL | 6 refills | Status: DC

## 2017-05-09 MED ORDER — SILDENAFIL CITRATE 20 MG PO TABS: 40.0000 mg | ORAL_TABLET | Freq: Three times a day (TID) | ORAL | 6 refills | Status: DC

## 2017-05-09 MED ORDER — WARFARIN SODIUM 5 MG PO TABS: 10.0000 mg | ORAL_TABLET | ORAL | 6 refills | Status: DC

## 2017-05-09 MED ORDER — GABAPENTIN 100 MG PO CAPS: 200.0000 mg | ORAL_CAPSULE | Freq: Two times a day (BID) | ORAL | 3 refills | Status: DC

## 2017-05-09 MED ORDER — DOBUTAMINE IN D5W 4-5 MG/ML-% IV SOLN: 7.5000 ug/kg/min | INTRAVENOUS | 6 refills | Status: DC

## 2017-05-09 MED ORDER — SPIRONOLACTONE 25 MG PO TABS
25.0000 mg | ORAL_TABLET | Freq: Every day | ORAL | 6 refills | Status: DC
Start: 2017-05-09 — End: 2017-06-03

## 2017-05-09 MED ORDER — PANTOPRAZOLE SODIUM 40 MG PO TBEC: 40.0000 mg | DELAYED_RELEASE_TABLET | Freq: Two times a day (BID) | ORAL | 6 refills | Status: DC

## 2017-05-09 MED ORDER — POTASSIUM CHLORIDE CRYS ER 20 MEQ PO TBCR
40.0000 meq | EXTENDED_RELEASE_TABLET | Freq: Once | ORAL | Status: AC
  Administered 2017-05-09: 40 meq via ORAL
  Filled 2017-05-09: qty 2

## 2017-05-09 MED ORDER — LOSARTAN POTASSIUM 25 MG PO TABS: 25.0000 mg | ORAL_TABLET | Freq: Every day | ORAL | 3 refills | Status: DC

## 2017-05-09 MED ORDER — POTASSIUM CHLORIDE CRYS ER 20 MEQ PO TBCR: 40.0000 meq | EXTENDED_RELEASE_TABLET | Freq: Two times a day (BID) | ORAL | 6 refills | Status: DC

## 2017-05-09 MED ORDER — TORSEMIDE 100 MG PO TABS: 100.0000 mg | ORAL_TABLET | Freq: Two times a day (BID) | ORAL | 6 refills | Status: DC

## 2017-05-09 MED ORDER — DIGOXIN 125 MCG PO TABS: 0.1250 mg | ORAL_TABLET | Freq: Every day | ORAL | 6 refills | Status: DC

## 2017-05-09 MED ORDER — MAGNESIUM OXIDE 400 (241.3 MG) MG PO TABS: 400.0000 mg | ORAL_TABLET | Freq: Every day | ORAL | 6 refills | Status: AC

## 2017-05-09 MED ORDER — SENNOSIDES-DOCUSATE SODIUM 8.6-50 MG PO TABS: 2.0000 | ORAL_TABLET | Freq: Every evening | ORAL | 6 refills | Status: AC | PRN

## 2017-05-09 NOTE — Progress Notes (Signed)
Presented to patients room today for drive line exit wound care. Existing VAD dressing removed and site care performed using sterile technique. Drive line exit site cleaned with Chlora prep applicators x 2, allowed to dry, and Sorbaview dressing with bio patch re-applied. Exit site healed and incorporated, the velour is fully implanted at exit site. No redness, tenderness, drainage, foul odor or rash noted. Drive line anchor re-applied. Pt denies fever or chills.   Plan for discharge today.  Balinda Quails RN, VAD Coordinator 24/7 pager [phone]

## 2017-05-09 NOTE — Progress Notes (Signed)
Patient ID: [name], male   DOB: Feb 15, 1956, 61 y.o.   MRN: [id number]   Advanced Heart Failure VAD Team Note  Subjective:    He has received 2u PRBCs. Hgb 7.6-> 8.9->9.1->9. Had enteroscopy 7/31 with ulcers in his stomach. Started on carafate.   Overall feeling good. Wants to go home.    LVAD INTERROGATION:  HeartMate 3 LVAD:  Flow 5.6  liters/min, speed 5900, power 4.6 PI 3.3 .9  26 PI events.   Objective:    Vital Signs:   Temp:  [97.5 F (36.4 C)-98.4 F (36.9 C)] 97.6 F (36.4 C) (08/06 0824) Pulse Rate:  [59-72] 72 (08/06 0824) Resp:  [16-24] 22 (08/06 0824) BP: (88-113)/(60-78) 88/60 (08/06 0824) SpO2:  [92 %-98 %] 98 % (08/06 0824) Weight:  [212 lb 4.9 oz (96.3 kg)] 212 lb 4.9 oz (96.3 kg) (08/06 0401) Last BM Date: 05/07/17 Mean arterial Pressure  70-80s    Intake/Output:   Intake/Output Summary (Last 24 hours) at 05/09/17 1046 Last data filed at 05/09/17 0900  Gross per 24 hour  Intake           2201.2 ml  Output             2302 ml  Net           -100.8 ml     Physical Exam   Physical Exam: CVP 9-10 Physical Exam: GENERAL: Well appearing, male. Sitting in the chair. NAD  HEENT: normal  NECK: Supple, JVP  ~10.  2+ bilaterally, no bruits.  No lymphadenopathy or thyromegaly appreciated.   CARDIAC:  Mechanical heart sounds with LVAD hum present. R upper chest tunneled PICC LUNGS:  Clear to auscultation bilaterally.  ABDOMEN:  Soft, round, nontender, positive bowel sounds x4.     LVAD exit site:   Dressing dry and intact.  No erythema or drainage.  Stabilization device present and accurately applied.  Driveline dressing is being changed daily per sterile technique. EXTREMITIES:  Warm and dry, no cyanosis, clubbing, rash or edema  NEUROLOGIC:  Alert and oriented x 4.  Gait steady.  No aphasia.  No dysarthria.  Affect pleasant.       Telemetry    A sensedV paced 70-80s    EKG    N/A  Labs   Basic Metabolic Panel:  Recent Labs Lab  05/05/17 0240 05/06/17 0455 05/07/17 0414 05/08/17 0358 05/09/17 0430  NA 128* 129* 126* 127* 129*  K 4.3 4.2 4.7 5.4* 3.6  CL 90* 90* 88* 91* 89*  CO2 29 28 29 29 31   GLUCOSE 146* 177* 193* 150* 170*  BUN 24* 25* 32* 26* 30*  CREATININE 1.23 1.31* 1.54* 1.33* 1.45*  CALCIUM 9.0 9.2 9.0 9.3 8.7*    Liver Function Tests: No results for input(s): AST, ALT, ALKPHOS, BILITOT, PROT, ALBUMIN in the last 168 hours. No results for input(s): LIPASE, AMYLASE in the last 168 hours. No results for input(s): AMMONIA in the last 168 hours.  CBC:  Recent Labs Lab 05/06/17 0455 05/07/17 0414 05/07/17 1439 05/08/17 0358 05/09/17 0430  WBC 6.4 8.2 8.0 8.1 8.4  HGB 9.3* 9.1* 9.6* 9.0* 9.4*  HCT 29.7* 28.3* 30.3* 28.9* 29.7*  MCV 81.6 80.2 81.5 81.6 80.9  PLT 210 198 182 161 169    INR:  Recent Labs Lab 05/05/17 0240 05/06/17 0618 05/07/17 0414 05/08/17 0358 05/09/17 0430  INR 1.34 1.46 1.87 2.00 2.46    Other results:     Imaging  No results found.   Medications:     Scheduled Medications: . amiodarone  200 mg Oral Daily  . citalopram  20 mg Oral Daily  . digoxin  0.125 mg Oral Daily  . docusate sodium  200 mg Oral BID  . gabapentin  200 mg Oral BID  . insulin aspart  0-15 Units Subcutaneous TID WC  . insulin aspart  0-5 Units Subcutaneous QHS  . insulin glargine  20 Units Subcutaneous QHS  . losartan  25 mg Oral Daily  . magnesium oxide  400 mg Oral Daily  . pantoprazole  40 mg Oral BID AC  . polyethylene glycol  17 g Oral Daily  . senna-docusate  2 tablet Oral Daily  . sildenafil  40 mg Oral TID  . spironolactone  25 mg Oral Daily  . sucralfate  1 g Oral TID WC & HS  . torsemide  100 mg Oral BID  . Warfarin - Pharmacist Dosing Inpatient   Does not apply q1800    Infusions: . sodium chloride 20 mL/hr at 05/02/17 1652  . DOBUTamine 7.5 mcg/kg/min (05/08/17 2036)    PRN Medications: acetaminophen, alum & mag hydroxide-simeth, clonazePAM,  ondansetron (ZOFRAN) IV, ondansetron, oxymetazoline, traMADol, zolpidem   Patient Profile   Ronald Milleris a 61 y.o.male with history of chronic systolic HF s/p Medtronic ICD 2014, CAD s/p CABG in 2000, HTN, Hx of cocaine abuse, Tobacco abuse, Depression, PAF, and COPD. HM3 placed 09/09/2016.   Presented to clinic 04/29/17 for follow up. Hgb down, INR up. Reports dark black stools and constipation. Admitted for treatment and further evaluation.    Assessment/Plan:    1. Acute on chronic anemia with melena: Likely acute upper GI bleed.  1 week of black stools with symptoms and drop in Hgb.- Iron low. Received Feraheme on 7/28.  Now s/p 2u RBCs.  Had scope with ulcers in stomach. Continue carafate and continue PPI twice a day.  Hgb stable at 9.4  INR 2.46   - No aspirin. INR goal 2-2.5.   - Transfuse hgb < 8.  2. Acute/Chronic end-stage biventricular systolic PO:EUMP 53% due to ICM ->s/p Echo 08/24/16 LVEF 15%, RV mild dilated, moderately reduced. -->S/P HM3 LVAD 09/09/2016. Complicated by RV failure. Initially on milrinone but stopped due to low mixed venous saturation, remains on home dobutamine 16mcg/kg/min.  Dobutamine increased to 7.86mcg. Todays CO-OX is 58%.  Volume status stable. Continue torsemide 100 mg twice a day. - Continue losartan 25 mg daily and spironolactone, MAP remains in 70s-80s.  - No beta blocker with RV failure - Continue dig 0.125 mg daily. Dig level 0.7.  - Continue Revatio 40 tid.  3. CAD: No CP. No CP. 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 No chest pain.  4. DMII: Continue scheduled insulin and SSI 5. Tobacco/substance abuse: Currently not using 6. Atrial fibrillation, paroxysmal: NSR 7. Anxiety/Depression: Continue current dose of celexa and klonipin.   8.H/O RUE DVT: Back on coumadin.   9. Hyponatremia: Sodium 129. Fluid restrict.  10. CKD stage 3: Creatinine 1.45. Renal function stable.    11. Epistaxis: Refused  ENT consult.  Seems to have resolved.   Follow up next week in the LVAD clinic.   I reviewed the LVAD parameters from today, and compared the results to the patient's prior recorded data.  No programming changes were made.  The LVAD is functioning within specified parameters.  The patient performs LVAD self-test daily.  LVAD interrogation  was negative for any significant power changes, alarms or PI events/speed drops.  LVAD equipment check completed and is in good working order.  Back-up equipment present.   LVAD education done on emergency procedures and precautions and reviewed exit site care.  Length of Stay: [place], NP 05/09/2017, 10:46 AM  VAD Team --- VAD ISSUES ONLY--- Pager [phone] (7am - 7am)  Advanced Heart Failure Team  Pager [phone] (M-F; 7a - 4p)  Please contact [facility] for night-coverage after hours (4p -7a ) and weekends on [email or web address]  Patient seen and examined with Darrick Grinder, NP. We discussed all aspects of the encounter. I agree with the assessment and plan as stated above.   He looks much better today on higher dose dobutamine and after diuresis. CVP down. Hgb stable without recurrent melena. Still having some intermittent epistaxis but refuses nasal packing. VAD parameters stable. INR 2.4.   Ok to d/c today to his sister's home. F/u In VAD clinic.   Glori Bickers, MD  9:29 PM

## 2017-05-09 NOTE — Progress Notes (Signed)
Explained and discussed discharge instructions to patient and sister. Prescriptions, and follow up appts given to Ronald Miller. [facility] came to switch dobutamine to home pump and heparinized the other port of picc line. Pt going home with sister with belongings. No complaints voiced at this time.

## 2017-05-09 NOTE — Care Management Note (Addendum)
Case Management Note  Patient Details  Name: Ronald Miller. MRN: [id number] Date of Birth: 1955/11/25  Subjective/Objective:   Pt admitted with GI bleed - hx of LVAD                 Action/Plan:   PTA at SNF - however pt refuses to return to SNF environment and plans to discharge to his sisters.  Pt will discharge home on IV dobutamine (has had med in the home previously with [facility]).  [facility] following and have accepted referral including home HF orders.  CM will continue to follow for discharge needs   Expected Discharge Date:  05/06/17               Expected Discharge Plan:  [place]  In-House Referral:     Discharge planning Services  CM Consult  Post Acute Care Choice:    Choice offered to:  Patient  DME Arranged:  IV pump/equipment DME Agency:  [place]:  RN [facility] Agency:  [place]  Status of Service:  In process, will continue to follow  If discussed at Long Length of Stay Meetings, dates discussed:    Additional Comments: 05/09/2017  CM spoke with [facility] CSW with HF team - CM informed that pt will need to have someone with him at discharge, HF SW will work with pt post discharge to come up with longer term plan for support as needed.  [facility] contacted and informed that pt will discharge home today - HF orders accepted by agency.  Pt will discharge to sisters home via private vehicle  05/06/17 Home with INR 2 - currently 1.4.  [facility] continues to follow for home dobutamine.  CM informed that CSW with HF team has informed pt that he will no longer be able to stay alone - CM left voicemail for HF CSW.  Pt is only planning on staying with sister for 3 weeks.  CM will continue to follow for discharge planning  Discussed in LOS 05/05/17 - pt remains appropriate for continued stay Pt remains on IV lasix.  [facility] continues to follow for home IV inotrope  05/03/17 Pt is independently ambulatory in the room per bedside nurse.  Pt denied  barriers with discharging home with [name] 05/09/2017, 8:43 AM

## 2017-05-09 NOTE — Progress Notes (Addendum)
Chart review for CDIOP-Pt D/C Home Health care for Dobutamine drip. Prescribed Ambien and Toradol both controlled substances.Have been keeping pt enrolled in hopes of return/advocacy for Heart Transplant complicated by  patients complicated condition Counselor will contact patient regarding his desire/ability to return.Medication issues/ non addictive alternatives can be explored/ addressed if he returns which would be his only chance at resolving issues that have led to his being declined 04/27/17 for Transplant  Committee Review - Kyra Manges, RN - 04/28/2017 6:38 PM EDT Formatting of this note may be different from the original.  [facility] Heart Transplant Committee Review Note  Evaluation Date: 12/30/2016 Committee Review Date: 04/27/2017  Organ being evaluated for: Heart  Transplant Phase: Evaluation Transplant Status: Active  Transplant Coordinator: Kyra Manges Transplant Evaluating Surgeon:   Referring Physician: Shaune Pascal Bensimhon  Primary Diagnosis:  Dilated Myopathy: Ischemic Secondary Diagnosis:   ABO: O POS  Scores:   Committee Review Members: Cardiology Leron Croak, MD, Audie Box, RN, Monia Sabal, MD, Estevan Ryder, MD, Rolene Course, MD, Randolm Idol, MD, Osie Bond, ANP  Cardiothoracic Surgery Toma Copier, MD  Dietitian Ruta Hinds, RD/LDN  Finance Casimer Lanius  Infectious Diseases Daryll Brod, MD  Palliative Medicine Bartholome Bill, ACNP  Pharmacist Duanne Moron, CPP, Wilber Oliphant, [facility]  Psychiatry Audry Pili, MD  Psychology Hyman Bible, PhD  Social Work Freda Jackson, MSW, Luster Landsberg  Transplant Verne Grain, RN, Fae Pippin, RN, Rudi Heap, RN, Jamse Belfast, RN, Mardelle Matte, RN, Henreitta Cea, RN, Basilio Cairo, ANP, Aggie Hacker, RN, Loetta Rough, RN, Kyra Manges, RN, Burman Foster, RN   Transplant Eligibility: Long Term Mechanical Circulatory  Support as BTT  Committee Review Decision: Declined  The patient was presented at the Heart Transplant Selection Conference on 04/27/2017 and is denied at 09:00 for Heart transplant. At this time patient does not meet criteria for listing. Per team's decision there are psychosocial contraindications to transplant listing. .  Relative Contraindications: Active and/or Untreated significant mental illness, Unresolved alcohol, drug or tobacco use  Absolute Contraindications: None  Committee Discussion Details:  Mr. Elting was previously presented at [facility] 01/2017 and was given a list of goals to achieve at that time. One of the goals was to complete mental health counselling for depression and substance abuse. Mr. Mccallum was unable to meet this goal. Additionally, we were recently notified by [facility]. [facility] LVAD team that he has been admitted to a SNF, as he is unable to care for himself at home. They also have reported that he has struggled to manage his anxiety. Given our ongoing concerns for his mental health we decided that he is not an acceptable candidate for transplant.  Primary [facility] HF Cardiologist: Dr. Princella Pellegrini Referring MD: Dr. Pierre Bali

## 2017-05-09 NOTE — Progress Notes (Signed)
ANTICOAGULATION CONSULT NOTE - Follow Up Consult  Pharmacy Consult for warfarin Indication: atrial fibrillation  Patient Measurements: Heparin Dosing Weight: 81.5 kg  Vital Signs: Temp: 97.6 F (36.4 C) (08/06 0824) Temp Source: Axillary (08/06 0824) BP: 88/60 (08/06 0824) Pulse Rate: 72 (08/06 0824)  Labs:  Recent Labs  05/07/17 0414 05/07/17 1439 05/08/17 0358 05/09/17 0430  HGB 9.1* 9.6* 9.0* 9.4*  HCT 28.3* 30.3* 28.9* 29.7*  PLT 198 182 161 169  LABPROT 21.8*  --  23.0* 27.1*  INR 1.87  --  2.00 2.46  HEPARINUNFRC 0.35  --   --   --   CREATININE 1.54*  --  1.33* 1.45*    Estimated Creatinine Clearance: 55.7 mL/min (A) (by C-G formula based on SCr of 1.45 mg/dL (H)).  Assessment: 42 yoM with end-stage heart failure (LVAD placement in 09/2016) on outpatient Dobutamione drip 63mcg/kg/min  for RV failure coox 60s -  with digoxin 0.25 level 1.1 dose dropped 0.151mcg qd, and sildenafil 40tid, Losartan 25  MAPs stable 70-80s  On coumadin PTA 15mg  TTS/10mg  MWFSu admit INR 3.7 -  admitted with HGB drop and melena> s/p 2 uts PRBC slight increase in h/h post transfusion.  Held warfarin for GI work up> s/p EGD with ulcerative changes but not active bleeding> begin sucralfate and ppi po and ok to restart Coumadin.  Hgb stable this AM.  No epistaxis noted.  INR within goal range.  Goal of Therapy:  INR 2-2.5 Monitor platelets by anticoagulation protocol: Yes   Plan:  Recommend previous Coumadin dose at discharge - 10 mg daily except 15 mg on Tues, Thurs, and Sat. Recheck INR later this week if possible.  Uvaldo Rising, BCPS  Clinical Pharmacist Pager [phone]  05/09/2017 12:13 PM

## 2017-05-09 NOTE — Progress Notes (Signed)
CARDIAC REHAB PHASE I   PRE:  Rate/Rhythm: 77 pacing    BP: sitting 85/72 (79)    SaO2:   MODE:  Ambulation: 240 ft   POST:  Rate/Rhythm: 86 pacing    BP: sitting 97/79 (86)     SaO2:    Pt feeling fairly well but eager to "get his stuff done". Seems stressed. Steady walking in hall with RW, probably did not need RW. C/o fatigue with distance, tired legs. No dizziness. Pt somewhat slow in processing donning his batteries. Flustered with many things. Frustrated with his d/c plan. [phone]  [place], ACSM 05/09/2017 11:41 AM

## 2017-05-09 NOTE — Progress Notes (Signed)
LVAD Coordinator Rounding Note:  Admitted 7/27/2018due to anemia and possible GIB.  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 Prescott Gum.   7/31> enteroscopy positive for stomach ulcers. Carafate started.  Vital signs: HR: 74vpaced Doppler Pressure: 78 Automatic BP: 88/60 (69) O2 Sat: 98 RA Wt: 224>221>217>215lbs>212  LVAD interrogation reveals:  Speed:5900 Flow: 5.7 Power: 5 PI: 3.2 Alarms: none Events: 23 PI events in last 24 hours Fixed speed: 5900 Low speed limit: 5600 HCT: 30- adjusted personally  Drive Line: changed 8/1. Next dressing change due 8/7.  Labs:  LDH trend: 156>167>158>161>155>150>151  INR trend: 3.01>2.35>1.59>1.47>1.28>1.34>1.46>2.00>2.46  HGB trend: 7.6>8.7>8 .9>8.2>8.7>8.6>8.8>9.0>9.3>9.0>9.4  Anticoagulation Plan: -INR Goal: 2-2.5 -ASA Dose: 81 mg   Blood products this admission:  04/30/17- 2 units PRBC's  Gtts: Dobutamine 6 mcg   Plan/Recommendations:   1. Plan for possible discharge soon 2. VAD Coordinator will change dressing today if plans are finalized for discharge.  Balinda Quails RN, VAD Coordinator 24/7 pager [phone]

## 2017-05-10 ENCOUNTER — Encounter: Payer: Self-pay | Admitting: Internal Medicine

## 2017-05-10 ENCOUNTER — Encounter (INDEPENDENT_AMBULATORY_CARE_PROVIDER_SITE_OTHER): Payer: Self-pay | Admitting: Cardiovascular Disease

## 2017-05-11 ENCOUNTER — Other Ambulatory Visit (HOSPITAL_COMMUNITY): Payer: Medicare HMO

## 2017-05-11 ENCOUNTER — Ambulatory Visit: Payer: Self-pay

## 2017-05-12 ENCOUNTER — Telehealth (HOSPITAL_COMMUNITY): Payer: Self-pay | Admitting: Psychology

## 2017-05-12 ENCOUNTER — Other Ambulatory Visit (HOSPITAL_COMMUNITY): Payer: Medicare HMO

## 2017-05-13 ENCOUNTER — Ambulatory Visit: Payer: Self-pay

## 2017-05-13 ENCOUNTER — Ambulatory Visit: Payer: Self-pay | Admitting: Family Medicine

## 2017-05-16 ENCOUNTER — Ambulatory Visit (HOSPITAL_COMMUNITY)
Admission: RE | Admit: 2017-05-16 | Discharge: 2017-05-16 | Disposition: A | Discharge status: Home / Self Care | Payer: Medicare HMO | Source: Ambulatory Visit | Attending: Internal Medicine | Admitting: Internal Medicine

## 2017-05-16 ENCOUNTER — Ambulatory Visit: Payer: Self-pay

## 2017-05-16 ENCOUNTER — Encounter (HOSPITAL_COMMUNITY): Payer: Self-pay

## 2017-05-16 ENCOUNTER — Other Ambulatory Visit (HOSPITAL_COMMUNITY): Payer: Medicare HMO | Attending: Psychiatry | Admitting: Medical

## 2017-05-16 DIAGNOSIS — J449 Chronic obstructive pulmonary disease, unspecified: Secondary | ICD-10-CM

## 2017-05-16 DIAGNOSIS — F1920 Other psychoactive substance dependence, uncomplicated: Secondary | ICD-10-CM | POA: Insufficient documentation

## 2017-05-16 DIAGNOSIS — E871 Hypo-osmolality and hyponatremia: Secondary | ICD-10-CM | POA: Diagnosis not present

## 2017-05-16 DIAGNOSIS — I50810 Right heart failure, unspecified: Secondary | ICD-10-CM | POA: Insufficient documentation

## 2017-05-16 DIAGNOSIS — F419 Anxiety disorder, unspecified: Secondary | ICD-10-CM | POA: Insufficient documentation

## 2017-05-16 DIAGNOSIS — T7492XS Unspecified child maltreatment, confirmed, sequela: Secondary | ICD-10-CM

## 2017-05-16 DIAGNOSIS — F329 Major depressive disorder, single episode, unspecified: Secondary | ICD-10-CM | POA: Insufficient documentation

## 2017-05-16 DIAGNOSIS — Z95811 Presence of heart assist device: Secondary | ICD-10-CM | POA: Insufficient documentation

## 2017-05-16 DIAGNOSIS — Z6372 Alcoholism and drug addiction in family: Secondary | ICD-10-CM

## 2017-05-16 DIAGNOSIS — E119 Type 2 diabetes mellitus without complications: Secondary | ICD-10-CM

## 2017-05-16 DIAGNOSIS — K274 Chronic or unspecified peptic ulcer, site unspecified, with hemorrhage: Secondary | ICD-10-CM | POA: Insufficient documentation

## 2017-05-16 DIAGNOSIS — I5023 Acute on chronic systolic (congestive) heart failure: Secondary | ICD-10-CM

## 2017-05-16 DIAGNOSIS — Z794 Long term (current) use of insulin: Secondary | ICD-10-CM

## 2017-05-16 DIAGNOSIS — IMO0001 Reserved for inherently not codable concepts without codable children: Secondary | ICD-10-CM

## 2017-05-16 DIAGNOSIS — F1421 Cocaine dependence, in remission: Secondary | ICD-10-CM

## 2017-05-16 DIAGNOSIS — F4312 Post-traumatic stress disorder, chronic: Secondary | ICD-10-CM

## 2017-05-16 DIAGNOSIS — F17200 Nicotine dependence, unspecified, uncomplicated: Secondary | ICD-10-CM

## 2017-05-16 NOTE — Progress Notes (Signed)
Patient presents to clinic today for drive line exit wound care. Existing VAD dressing removed and site care performed using sterile technique. Drive line exit site cleaned with Chlora prep applicators x 2, allowed to dry, and Sorbaview dressing with bio patch re-applied. Exit site healed and incorporated, the velour is fully implanted at exit site. No redness, tenderness, drainage, foul odor or rash noted. Drive line anchor re-applied. Pt denies fever or chills.   Return Thursday for VAD appointment.  Balinda Quails RN, VAD Coordinator 24/7 pager [phone] .

## 2017-05-17 ENCOUNTER — Other Ambulatory Visit (HOSPITAL_COMMUNITY): Payer: Self-pay | Admitting: *Deleted

## 2017-05-18 ENCOUNTER — Ambulatory Visit: Payer: Self-pay

## 2017-05-18 ENCOUNTER — Other Ambulatory Visit (HOSPITAL_COMMUNITY): Payer: Self-pay | Admitting: Pharmacist

## 2017-05-18 ENCOUNTER — Other Ambulatory Visit (HOSPITAL_COMMUNITY): Payer: Medicare HMO

## 2017-05-18 MED ORDER — CLONAZEPAM 0.25 MG PO TBDP: 0.2500 mg | ORAL_TABLET | Freq: Two times a day (BID) | ORAL | 0 refills | Status: DC

## 2017-05-19 ENCOUNTER — Ambulatory Visit (HOSPITAL_COMMUNITY)
Admission: RE | Admit: 2017-05-19 | Discharge: 2017-05-19 | Disposition: A | Discharge status: Home / Self Care | Payer: Medicare HMO | Source: Ambulatory Visit | Attending: Internal Medicine | Admitting: Internal Medicine

## 2017-05-19 ENCOUNTER — Ambulatory Visit (HOSPITAL_COMMUNITY): Payer: Self-pay | Admitting: Pharmacist

## 2017-05-19 DIAGNOSIS — Z7901 Long term (current) use of anticoagulants: Secondary | ICD-10-CM

## 2017-05-19 DIAGNOSIS — R040 Epistaxis: Secondary | ICD-10-CM | POA: Insufficient documentation

## 2017-05-19 DIAGNOSIS — I2510 Atherosclerotic heart disease of native coronary artery without angina pectoris: Secondary | ICD-10-CM | POA: Diagnosis not present

## 2017-05-19 DIAGNOSIS — D649 Anemia, unspecified: Secondary | ICD-10-CM

## 2017-05-19 DIAGNOSIS — Z794 Long term (current) use of insulin: Secondary | ICD-10-CM | POA: Insufficient documentation

## 2017-05-19 DIAGNOSIS — I82621 Acute embolism and thrombosis of deep veins of right upper extremity: Secondary | ICD-10-CM | POA: Insufficient documentation

## 2017-05-19 DIAGNOSIS — E119 Type 2 diabetes mellitus without complications: Secondary | ICD-10-CM | POA: Diagnosis not present

## 2017-05-19 DIAGNOSIS — K259 Gastric ulcer, unspecified as acute or chronic, without hemorrhage or perforation: Secondary | ICD-10-CM | POA: Insufficient documentation

## 2017-05-19 DIAGNOSIS — I480 Paroxysmal atrial fibrillation: Secondary | ICD-10-CM | POA: Diagnosis not present

## 2017-05-19 DIAGNOSIS — Z95811 Presence of heart assist device: Secondary | ICD-10-CM

## 2017-05-19 DIAGNOSIS — Z95810 Presence of automatic (implantable) cardiac defibrillator: Secondary | ICD-10-CM | POA: Diagnosis not present

## 2017-05-19 DIAGNOSIS — I110 Hypertensive heart disease with heart failure: Secondary | ICD-10-CM | POA: Insufficient documentation

## 2017-05-19 DIAGNOSIS — D509 Iron deficiency anemia, unspecified: Secondary | ICD-10-CM | POA: Diagnosis not present

## 2017-05-19 DIAGNOSIS — Z951 Presence of aortocoronary bypass graft: Secondary | ICD-10-CM | POA: Insufficient documentation

## 2017-05-19 DIAGNOSIS — J449 Chronic obstructive pulmonary disease, unspecified: Secondary | ICD-10-CM | POA: Insufficient documentation

## 2017-05-19 DIAGNOSIS — K921 Melena: Secondary | ICD-10-CM | POA: Diagnosis not present

## 2017-05-19 DIAGNOSIS — F1410 Cocaine abuse, uncomplicated: Secondary | ICD-10-CM | POA: Diagnosis not present

## 2017-05-19 DIAGNOSIS — I5022 Chronic systolic (congestive) heart failure: Secondary | ICD-10-CM

## 2017-05-19 DIAGNOSIS — Z7982 Long term (current) use of aspirin: Secondary | ICD-10-CM | POA: Insufficient documentation

## 2017-05-19 DIAGNOSIS — E871 Hypo-osmolality and hyponatremia: Secondary | ICD-10-CM | POA: Diagnosis not present

## 2017-05-19 DIAGNOSIS — I5082 Biventricular heart failure: Secondary | ICD-10-CM | POA: Diagnosis not present

## 2017-05-19 DIAGNOSIS — F329 Major depressive disorder, single episode, unspecified: Secondary | ICD-10-CM | POA: Diagnosis not present

## 2017-05-19 LAB — BASIC METABOLIC PANEL
ANION GAP: 10 (ref 5–15)
BUN: 48 mg/dL — AB (ref 6–20)
CHLORIDE: 90 mmol/L — AB (ref 101–111)
CO2: 27 mmol/L (ref 22–32)
CREATININE: 1.81 mg/dL — AB (ref 0.61–1.24)
Calcium: 8.7 mg/dL — ABNORMAL LOW (ref 8.9–10.3)
GFR calc non Af Amer: 39 mL/min — ABNORMAL LOW (ref >60)
GFR, EST AFRICAN AMERICAN: 45 mL/min — AB (ref >60)
Glucose, Bld: 124 mg/dL — ABNORMAL HIGH (ref 65–99)
Potassium: 4.7 mmol/L (ref 3.5–5.1)
Sodium: 127 mmol/L — ABNORMAL LOW (ref 135–145)

## 2017-05-19 LAB — CBC
HCT: 29.1 % — ABNORMAL LOW (ref 39.0–52.0)
Hemoglobin: 9.6 g/dL — ABNORMAL LOW (ref 13.0–17.0)
MCH: 27.2 pg (ref 26.0–34.0)
MCHC: 33 g/dL (ref 30.0–36.0)
MCV: 82.4 fL (ref 78.0–100.0)
PLATELETS: 165 10*3/uL (ref 150–400)
RBC: 3.53 MIL/uL — ABNORMAL LOW (ref 4.22–5.81)
RDW: 22.1 % — AB (ref 11.5–15.5)
WBC: 8.8 10*3/uL (ref 4.0–10.5)

## 2017-05-19 LAB — PROTIME-INR
INR: 3.98
Prothrombin Time: 39.9 seconds — ABNORMAL HIGH (ref 11.4–15.2)

## 2017-05-19 LAB — LACTATE DEHYDROGENASE: LDH: 208 U/L — AB (ref 98–192)

## 2017-05-19 LAB — COOXEMETRY PANEL
Carboxyhemoglobin: 1.9 % — ABNORMAL HIGH (ref 0.5–1.5)
METHEMOGLOBIN: 1.3 % (ref 0.0–1.5)
O2 Saturation: 81.8 %
Total hemoglobin: 8.7 g/dL — ABNORMAL LOW (ref 12.0–16.0)

## 2017-05-19 MED ORDER — TORSEMIDE 100 MG PO TABS: ORAL_TABLET | ORAL | 6 refills | Status: DC

## 2017-05-19 NOTE — Progress Notes (Signed)
CSw met with patient and his mother in the clinic. Patient states he is feeling so much better and wants to get involved with volunteer work. "I've been out walking and getting around in my new car" Patient looks great and was positive and very animated in discussion. Patient's mother also with him and stated how nice it is to see him feeling better and getting around. CSW encouraged patient and mother to attend LVAD support group coming up next Monday. Patient enthusiastic about attendance. CSW will continue to follow and provide support. Raquel Sarna, [place], [place]

## 2017-05-19 NOTE — Progress Notes (Signed)
Patient presents for hospital discharge  follow up in [facility] today. Reports no problems with VAD equipment or concerns with drive line. States he is feeling great and is able to walk without using his walker.   Vital Signs:  Doppler Pressure 90   Automatc BP: 126/72 (85) HR:76   SPO2:96  %  Weight: 219.8 lb w/ eqt Last weight: 212 lb at discharge Home weights: 215-220 lbs   VAD Indication: Destination Therapy- evaluated at [facility]    VAD interrogation & Equipment Management: Speed:5900 Flow: 5.5 Power:4.7 w    PI:3.3  Alarms: no clinical alarms Events: 36-48 PI events daily  Fixed speed 5900 Low speed limit: 5600  Primary Controller:  Replace back up battery in 48months. Back up controller:   Replace back up battery in 29 months.  Annual Equipment Maintenance on UBC/PM was performed on 09/2016.   I reviewed the LVAD parameters from today and compared the results to the patient's prior recorded data. LVAD interrogation was NEGATIVE for significant power changes, NEGATIVE for clinical alarms and STABLE for PI events/speed drops. No programming changes were made and pump is functioning within specified parameters. Pt is performing daily controller and system monitor self tests along with completing weekly and monthly maintenance for LVAD equipment.  LVAD equipment check completed and is in good working order. Back-up equipment present. Charged back up battery and performed self-test on equipment.   Exit Site Care: Drive line is being maintained weekly  by VAD team. Drive line exit site well healed and incorporated. The velour is fully implanted at exit site. Dressing dry and intact. No erythema or drainage. Stabilization device present and accurately applied. Pt denies fever or chills.   Significant Events on VAD Support:  11/30/16> Milrinone gtt at discharge 01/27/17>dobutamine at 5 mcg/kg/mingtt at discharge 03/08/17> RV failure  Device:Protect Therapies: off;  end of service 08/2016 Last check: complete interrogation today per Darnelle Maffucci (Medtronic). DDD (lower rate 60); BiV pacing with 96% AS-VP   BP & Labs:  MAP90 - Doppler is reflecting MAP  Hgb 9.6 - No S/S of bleeding. Specifically denies melena/BRBPR or nosebleeds.  LDH stable at 208 with established baseline of 180- 250. Denies tea-colored urine. No power elevations noted on interrogation.   Plan: 1. Referral made to Cardiac rehab 2. Referral to family medicine for DM management. Appt 10/9 and will be placed on wait list to be seen earlier. 3. Decrease Dobutamine to 6 mcg 4. Decrease Torsemide to 100 mg in the morning and 50 mg in the evening. 5. RTC Monday for BMET/INR/dressing change  Balinda Quails RN [place] Coordinator   Office: [phone] 24/7 Emergency VAD Pager: [phone]

## 2017-05-19 NOTE — Patient Instructions (Addendum)
Return to clinic Monday for labs and dressing change.  Decrease the Torsemide to 100 mg (2 tablets) in the morning and 50 mg (1 tablet) in the evening.  Judson Roch will decrease your Dobutamine drip to 6 mcg tomorrow.  You look great! Keep up the great work!!!

## 2017-05-20 ENCOUNTER — Ambulatory Visit: Payer: Self-pay

## 2017-05-20 ENCOUNTER — Telehealth (HOSPITAL_COMMUNITY): Payer: Self-pay | Admitting: Unknown Physician Specialty

## 2017-05-20 MED ORDER — DOBUTAMINE IN D5W 4-5 MG/ML-% IV SOLN: 6.0000 ug/kg/min | INTRAVENOUS | Status: DC

## 2017-05-20 NOTE — Progress Notes (Signed)
Advanced Heart Failure Clinic Note    Referring Provider: Darylene Price, FNP Primary Care: [facility] Primary Cardiologist: Dr Haroldine Laws.  Transplant Center- [facility]  HPI: Ronald Miller. is a 61 y.o. male with history of chronic systolic HF s/p Medtronic ICD 2014, CAD s/p CABG in 2000, HTN, Hx of cocaine abuse, Tobacco abuse, Depression, PAF, and COPD. HM3 placed 09/09/2016.   Admitted 11/2016 with volume overload and low output heart failure. Diuresed with IV lasix and placed on milrinone. Discharged on milrinone.   Admitted 01/21/2017 with volume overload. Mixed venous saturation was low so milrinone was increased to 0.375 mcg. Milrinone was stopped with persistently low mixed venous saturation so dobtuamine was started. He discharged on 0.5 mcg dobutamine. Hospital course complicated by RUE swelling. US showed non occlusive DVT in R brachial vein. PICC removed and later tunneled PICC was placed. Discharge weight 233 pounds.   Underwent ramp RHC on 02/11/17 which showed primarily RV failure with normal output.   VAD speed 5700  On dobutamine 5 mcg/kg/min  RA = 17 RV =  44/18 PA =  41/15 (27) PCW = 16 Fick cardiac output/index = 8.1/3.7 Thermo CO/CI = 6.7/3.0 Ao sat = 95% PA sat = 62%, 62%   Admitted 03/04/17-03/08/17 with recurrent right heart failure and severe hyponatremia. CVP > 20. Treated with tolvaptan and IV lasix. Diuresed 11 pounds. D/c weight 223. Also treated with IV feraheme for iron-deficiency anemia. CT chest negative for outlfow tract obstruction. Sent home with order for IV lasix every Monday and Friday.   Admitted 04/29/17-05/09/17 with melena and recurrent HF. EGD showed ulcers in stomach. H pylori negative. Diuresed. Dobutamine increased from 6 to 7.5 due to low output. D/c home with his sister.   Returns today for  follow up. Says he feels great. Best he has felt in a long time. Says he has a new attitude and is trying to do everything right.  Remains on dobutamine 7.5. Watching fluid intake closely. Taking torsemide 100 bid. Trying to be more active. Denies edema, orthopnea, PND, bleeding or neuro symptoms.   Denies LVAD trauma. Denies driveline trauma, erythema, or drainage. No ICD shocks.   Reports taking Coumadin as prescribed and adherence to anticoagulation based dietary restrictions. Denies bright red blood per rectum or melena, no dark urine or hematuria.    VAD Indication: Destination Therapy- evaluated at [facility]    VAD interrogation & Equipment Management: Speed:5900 Flow: 5.5 Power:4.7 w PI:3.3  Alarms: no clinical alarms Events: 36-48 PI events daily  Fixed speed 5900 Low speed limit: 5600   Past Medical History:  Diagnosis Date  . AICD (automatic cardioverter/defibrillator) present   . ASCVD (arteriosclerotic cardiovascular disease)   . Chronic systolic CHF (congestive heart failure) ([place])   . COPD (chronic obstructive pulmonary disease) ([name])   . Coronary artery disease   . Depression   . Diabetes mellitus   . History of cocaine abuse   . Hypertension   . Presence of permanent cardiac pacemaker   . Shortness of breath dyspnea   . Suicidal ideation   . Tobacco abuse     Current Outpatient Prescriptions  Medication Sig Dispense Refill  . amiodarone (PACERONE) 200 MG tablet Take 1 tablet (200 mg total) by mouth daily. 30 tablet 6  . citalopram (CELEXA) 20 MG tablet Take 1 tablet (20 mg total) by mouth daily. 30 tablet 6  . clonazePAM (KLONOPIN) 0.25 MG disintegrating tablet Take 1 tablet (0.25 mg total) by mouth 2 (  two) times daily. 60 tablet 0  . digoxin (DIGOX) 0.125 MG tablet Take 1 tablet (0.125 mg total) by mouth daily. 30 tablet 6  . docusate sodium (COLACE) 100 MG capsule Take 200 mg by mouth at bedtime.     . gabapentin (NEURONTIN) 100 MG capsule Take 2 capsules (200 mg total) by mouth 2 (two) times daily. 120 capsule 3  . insulin aspart (NOVOLOG FLEXPEN) 100 UNIT/ML FlexPen Inject 5  Units into the skin 3 (three) times daily with meals. 15 mL 11  . Insulin Glargine (LANTUS SOLOSTAR) 100 UNIT/ML Solostar Pen Inject 20 Units into the skin daily at 10 pm. 15 mL 11  . losartan (COZAAR) 25 MG tablet Take 1 tablet (25 mg total) by mouth daily. 30 tablet 3  . magnesium oxide (MAG-OX) 400 (241.3 Mg) MG tablet Take 1 tablet (400 mg total) by mouth daily. 30 tablet 6  . Multiple Vitamin (MULTIVITAMIN WITH MINERALS) TABS tablet Take 1 tablet by mouth daily.    . ondansetron (ZOFRAN ODT) 4 MG disintegrating tablet Take 1 tablet (4 mg total) by mouth every 8 (eight) hours as needed for nausea. 6 tablet 0  . pantoprazole (PROTONIX) 40 MG tablet Take 1 tablet (40 mg total) by mouth 2 (two) times daily before a meal. 60 tablet 6  . potassium chloride SA (K-DUR,KLOR-CON) 20 MEQ tablet Take 2 tablets (40 mEq total) by mouth 2 (two) times daily. 120 tablet 6  . senna-docusate (SENOKOT S) 8.6-50 MG tablet Take 2 tablets by mouth at bedtime as needed for mild constipation. 30 tablet 6  . sildenafil (REVATIO) 20 MG tablet Take 2 tablets (40 mg total) by mouth 3 (three) times daily. 120 tablet 6  . spironolactone (ALDACTONE) 25 MG tablet Take 1 tablet (25 mg total) by mouth daily. 30 tablet 6  . sucralfate (CARAFATE) 1 GM/10ML suspension Take 10 mLs (1 g total) by mouth 4 (four) times daily -  with meals and at bedtime. 420 mL 6  . torsemide (DEMADEX) 100 MG tablet Take 100 mg in the morning and 50 mg in the evening 60 tablet 6  . traMADol (ULTRAM) 50 MG tablet Take 1 tablet (50 mg total) by mouth 3 (three) times daily as needed for severe pain. 30 tablet 0  . warfarin (COUMADIN) 5 MG tablet Take 2-3 tablets (10-15 mg total) by mouth See admin instructions. Take 3 tablets (15 mg) on Tues, Thurs, Sat Take 2 tablets (10 mg) on Mon, Wed, Fri, Sun 45 tablet 6  . zolpidem (AMBIEN) 10 MG tablet Take 10 mg by mouth at bedtime as needed for sleep.    . DOBUTamine (DOBUTREX) 4-5 MG/ML-% infusion Inject 571.8  mcg/min into the vein continuous. PER [facility] pharmacy     No current facility-administered medications for this encounter.     Codeine; Trazodone and nefazodone; Lipitor [atorvastatin]; and Tape  REVIEW OF SYSTEMS: All systems negative except as listed in HPI, PMH and Problem list.   Vital Signs:  Doppler Pressure 90                Automatc BP: 126/72 (85) HR:76  SPO2:96  %  Weight: 219.8 lb w/ eqt Last weight: 212 lb at discharge Home weights: 215-220 lbs    Wt Readings from Last 3 Encounters:  05/09/17 212 lb 4.9 oz (96.3 kg)  04/29/17 233 lb (105.7 kg)  04/22/17 231 lb (104.8 kg)    Physical Exam: General:  NAD. Much improved appearing. Able to walk briskly in  clinic HEENT: normal  Neck: supple. JVP 6.  Carotids 2+ bilat; no bruits. No lymphadenopathy or thryomegaly appreciated. Cor: LVAD hum. R upper chest PICC Lungs: Clear. Abdomen: obese soft, nontender, non-distended. No hepatosplenomegaly. No bruits or masses. Good bowel sounds. Driveline site clean. Anchor in place.  Extremities: no cyanosis, clubbing, rash. Warm no edema   Neuro: alert & oriented x 3. No focal deficits. Moves all 4 without problem    ASSESSMENT AND PLAN:  1. Chronic end-stage biventricular systolic HF: LVEF 24% due to ICM -> s/p Echo 08/24/16 LVEF 15%, RV mild dilated, moderately reduced. --> S/P HM3 LVAD 49/04/5299.  - Complicated by RV failure. Initially on milrinone but stopped due to low mixed venous saturation.  Now on dobutamine 7.5. Recently titrated due to low co-ox.  - Much improved today. Co-ox 81%. Volume status looks good and probably a little dry. Decrease torsemide to 100/50. Can take extra as needed.  - Has been turned down for transplant at [facility]. He is now motivated to requalify. Encouraged him to stay motivated.  - Continue sildenafil 40 tid.    - No beta blocker with RV failure - Continue digoxin 0.125 mcg daily.  - VAD parameters reviewed personally - stable - Warfarin with  goal INR 2.0 - 2.5 + ASA 81 daily. INR 3.98 today. Discussed with PharmD - they will adjust - LDH 208 - Had epistaxis with lovenox, will avoid this in the future.  2. CAD: No CP. 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  - No s/s ischemia  3. DMII:  - Followed by PCP.   - Stressed need for better control as part of transplant guidelines. May benefit from Jardiance 4. Tobacco abuse:  - Reports complete cessation 5. Atrial fibrillation, paroxysmal - In NSR. Continue amio 200 daily 6. Left pleural effusion - underwent repeat thoracentesis in Jan. 2018.  - Stable. Recent CXR without significant recurrent effusion. 7.  Anxiety/Depression - Continue Celexa 20mg  daily  - Living with his sister seems to have helped tremendously - Stressed need to f/u with counselors to help re-qualify for transplant 8. RUE DVT.  - Continue warfarin.  9. Hyponatremia:  - sodium 130-> 127 . Asymptomatic. Continue to restrict free water. 10. Anemia, iron-deficiency:  Hgb 9.4    - Recent EGD with severe gastritis. Continue PPI and carafate. Biopsies negative. Has received Feraheme  Total time spent 45 minutes. Over half that time spent discussing above.    Glori Bickers, MD  9:37 PM

## 2017-05-20 NOTE — Telephone Encounter (Signed)
Received call from [facility] requesting orders for a decrease in Dobutamine to 6 mcg/min. Orders faxed to [facility].

## 2017-05-23 ENCOUNTER — Ambulatory Visit: Payer: Self-pay

## 2017-05-23 ENCOUNTER — Encounter (HOSPITAL_COMMUNITY): Payer: Self-pay

## 2017-05-23 ENCOUNTER — Ambulatory Visit (HOSPITAL_COMMUNITY)
Admission: RE | Admit: 2017-05-23 | Discharge: 2017-05-23 | Disposition: A | Discharge status: Home / Self Care | Payer: Medicare HMO | Source: Ambulatory Visit | Attending: Cardiology | Admitting: Cardiology

## 2017-05-23 ENCOUNTER — Ambulatory Visit (HOSPITAL_COMMUNITY): Payer: Self-pay | Admitting: Pharmacist

## 2017-05-23 ENCOUNTER — Other Ambulatory Visit (HOSPITAL_COMMUNITY): Payer: Self-pay | Admitting: *Deleted

## 2017-05-23 DIAGNOSIS — Z95811 Presence of heart assist device: Secondary | ICD-10-CM | POA: Diagnosis present

## 2017-05-23 LAB — CBC
HEMATOCRIT: 24.7 % — AB (ref 39.0–52.0)
Hemoglobin: 8 g/dL — ABNORMAL LOW (ref 13.0–17.0)
MCH: 27.9 pg (ref 26.0–34.0)
MCHC: 32.4 g/dL (ref 30.0–36.0)
MCV: 86.1 fL (ref 78.0–100.0)
Platelets: 139 10*3/uL — ABNORMAL LOW (ref 150–400)
RBC: 2.87 MIL/uL — AB (ref 4.22–5.81)
RDW: 22.7 % — AB (ref 11.5–15.5)
WBC: 7.6 10*3/uL (ref 4.0–10.5)

## 2017-05-23 LAB — PROTIME-INR
INR: 4.68 — AB
Prothrombin Time: 44.9 seconds — ABNORMAL HIGH (ref 11.4–15.2)

## 2017-05-23 LAB — BASIC METABOLIC PANEL
ANION GAP: 9 (ref 5–15)
BUN: 45 mg/dL — ABNORMAL HIGH (ref 6–20)
CALCIUM: 8.8 mg/dL — AB (ref 8.9–10.3)
CHLORIDE: 93 mmol/L — AB (ref 101–111)
CO2: 30 mmol/L (ref 22–32)
Creatinine, Ser: 1.71 mg/dL — ABNORMAL HIGH (ref 0.61–1.24)
GFR calc Af Amer: 48 mL/min — ABNORMAL LOW (ref >60)
GFR calc non Af Amer: 42 mL/min — ABNORMAL LOW (ref >60)
Glucose, Bld: 136 mg/dL — ABNORMAL HIGH (ref 65–99)
POTASSIUM: 4.2 mmol/L (ref 3.5–5.1)
Sodium: 132 mmol/L — ABNORMAL LOW (ref 135–145)

## 2017-05-23 NOTE — Progress Notes (Signed)
Patient presents to clinic today for drive line exit wound care. Existing VAD dressing removed and site care performed using sterile technique. Drive line exit site cleaned with Chlora prep applicators x 2, allowed to dry, and Sorbaview dressing with bio patch re-applied. Exit site healed and incorporated, the velour is fully implanted at exit site. No redness, tenderness, drainage, foul odor or rash noted. Drive line anchor re-applied. Pt denies fever or chills.   States he has had black stool since Saturday 05/21/17. Feels great except more easily tired. Will repeat CBC Thursday along with INR.  Return in 1 week for another dressing change per standard of care. See note from PharmD for warfarin management.  Balinda Quails RN, VAD Coordinator 24/7 pager [phone]

## 2017-05-23 NOTE — Progress Notes (Signed)
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 CVP > 20. Treated        with tolvaptan and IV lasix. Diuresed 11 pounds. D/c weight 223. Also treated with IV feraheme for             iron-deficiency anemia. CT chest negative for outlfow tract obstruction. Sent home with order for IV           lasix every Monday and Friday        Pt returned to Group 6/11-04/04/2017 but again required admission for problems related to heart failure   Admitted 7/9 through 04/16/2017. Admitted for AMS and A/C Systolic Heart Failure.Psychiatry saw this admission and recommended increasing his Celexa and adding prn Klonopin.  ( No mention by Psychiatrist of his CDIOP enrollment ) CO-OX low so dobutamine was increased to 6 mcg. Hospital course complicated by fluid overload. Discharged to [facility]. Discharge weight 210 pounds.      04/13/2017 CDIOP Message sent regarding CD IOP status and Klonopin RX,Pt unable to return while       in SNF. Developed bleeding ulcers (probably stress) requiring readmission         Admitted 04/29/17-05/09/17 with GI Bleed hx of melena and recurrent HF. EGD showed ulcers in                  stomach. H pylori negative. Diuresed. Dobutamine increased from 6 to 7.5 due to low output. D/c              home with his sister.   05/09/2017 Chart review for CDIOP-Pt D/C Home Health care for Dobutamine drip. Prescribed Ambien and Toradol both controlled substances.Have been keeping pt enrolled in hopes of  return/advocacy for Heart Transplant complicated by  patients complicated condition Counselor will contact patient regarding his desire/ability to return.Medication issues/ non addictive alternatives can be explored/ addressed if he returns which would be his only chance at resolving issues that have led to his being declined 04/27/17 for Transplant Relative Contraindications: Active and/or Untreated significant mental illness, Unresolved alcohol, drug or tobacco use  Absolute Contraindications: None   Committee Discussion Details:  Mr. Venhuizen was previously presented at [facility] 01/2017 and was given a list of goals to achieve at that time. One of the goals was to complete mental health counselling for depression and substance abuse. Mr. Goswami was unable to meet this goal. Additionally, we were recently notified by Mr. [facility] LVAD team that he has been admitted to a SNF, as he is unable to care for himself at home. They also have reported that he has struggled to manage his anxiety. Given our ongoing concerns for his mental health we decided that he is not an acceptable candidate for transplant.  Primary [facility] HF Cardiologist: Dr. Princella Pellegrini Referring MD: Dr. Linna Hoff Bensimhon     Pt returned to group to visit approximately 1 week ago and said he would not be able to complete the program. He stated he was told he "could not stop taking" the controlled substances he was put on.  Goals and Activities to Help Maintain Sobriety: 1. Stay away from old friends who continue to drink and use mind-altering chemicals. 2. Continue practicing Fair Fighting rules in interpersonal conflicts. 3. Continue alcohol and drug refusal skills and call on support systems 4     Individual counseling for recovery and if he wishes to try to resolve issues around transplant denial  Referrals: NA  Aftercare services: NA 1. Attend AA/NA meetings 7 times per week. 2. Obtain a sponsor and a home group in  [place]. 3. Return to Psychotherapist:of choice  Next appointment: pt to schedule  Plan of Action to Address Continuing Problems: above    Client has NOT participated in the development of this discharge plan and has NOT received a copy of this completed plan  Ronald Miller  05/23/2017   [facility]-CIOP[facility] CHEM 05/23/2017

## 2017-05-24 ENCOUNTER — Other Ambulatory Visit (HOSPITAL_COMMUNITY): Payer: Self-pay | Admitting: *Deleted

## 2017-05-24 ENCOUNTER — Encounter (HOSPITAL_COMMUNITY): Payer: Self-pay

## 2017-05-24 MED ORDER — ZOLPIDEM TARTRATE 10 MG PO TABS: 10.0000 mg | ORAL_TABLET | Freq: Every evening | ORAL | 2 refills | Status: DC | PRN

## 2017-05-24 NOTE — Progress Notes (Signed)
CSW met with patient in the clinic. Patient was in good spirits and shared plans to attend the LVAD support group tonight. Patient states all is going well at home with his sister and plans to remain for a short time. Patient states some financial challenges with driving back and forth to [facility] for multiple appointments. CSW assisted with a gas card to alleviate added stress and help focus on continued recovery. Patient very grateful and looking forward to support group and the benefits of peer support. CSW will continue to provide supportive intervention and address needs as they arise. Raquel Sarna, [place], [place]

## 2017-05-25 ENCOUNTER — Other Ambulatory Visit (HOSPITAL_COMMUNITY): Payer: Self-pay | Admitting: *Deleted

## 2017-05-25 ENCOUNTER — Ambulatory Visit: Payer: Self-pay

## 2017-05-25 DIAGNOSIS — Z95811 Presence of heart assist device: Secondary | ICD-10-CM

## 2017-05-26 ENCOUNTER — Other Ambulatory Visit (HOSPITAL_COMMUNITY): Payer: Self-pay | Admitting: *Deleted

## 2017-05-26 ENCOUNTER — Ambulatory Visit (HOSPITAL_COMMUNITY): Payer: Self-pay | Admitting: Pharmacist

## 2017-05-26 ENCOUNTER — Ambulatory Visit (HOSPITAL_COMMUNITY)
Admission: RE | Admit: 2017-05-26 | Discharge: 2017-05-26 | Disposition: A | Discharge status: Home / Self Care | Payer: Medicare HMO | Source: Ambulatory Visit | Attending: Internal Medicine | Admitting: Internal Medicine

## 2017-05-26 ENCOUNTER — Telehealth (HOSPITAL_COMMUNITY): Payer: Self-pay | Admitting: *Deleted

## 2017-05-26 ENCOUNTER — Telehealth: Payer: Self-pay | Admitting: Internal Medicine

## 2017-05-26 VITALS — BP 80/0 | HR 75 | Ht 63.0 in | Wt 221.0 lb

## 2017-05-26 DIAGNOSIS — D500 Iron deficiency anemia secondary to blood loss (chronic): Secondary | ICD-10-CM | POA: Diagnosis not present

## 2017-05-26 DIAGNOSIS — E119 Type 2 diabetes mellitus without complications: Secondary | ICD-10-CM | POA: Insufficient documentation

## 2017-05-26 DIAGNOSIS — I110 Hypertensive heart disease with heart failure: Secondary | ICD-10-CM | POA: Diagnosis not present

## 2017-05-26 DIAGNOSIS — Z95811 Presence of heart assist device: Secondary | ICD-10-CM

## 2017-05-26 DIAGNOSIS — Z7902 Long term (current) use of antithrombotics/antiplatelets: Secondary | ICD-10-CM | POA: Insufficient documentation

## 2017-05-26 DIAGNOSIS — Z87891 Personal history of nicotine dependence: Secondary | ICD-10-CM | POA: Diagnosis not present

## 2017-05-26 DIAGNOSIS — Z7982 Long term (current) use of aspirin: Secondary | ICD-10-CM | POA: Insufficient documentation

## 2017-05-26 DIAGNOSIS — K922 Gastrointestinal hemorrhage, unspecified: Secondary | ICD-10-CM

## 2017-05-26 DIAGNOSIS — Z794 Long term (current) use of insulin: Secondary | ICD-10-CM | POA: Diagnosis not present

## 2017-05-26 DIAGNOSIS — K921 Melena: Secondary | ICD-10-CM

## 2017-05-26 DIAGNOSIS — Z9889 Other specified postprocedural states: Secondary | ICD-10-CM | POA: Insufficient documentation

## 2017-05-26 DIAGNOSIS — I82621 Acute embolism and thrombosis of deep veins of right upper extremity: Secondary | ICD-10-CM | POA: Insufficient documentation

## 2017-05-26 DIAGNOSIS — K31811 Angiodysplasia of stomach and duodenum with bleeding: Secondary | ICD-10-CM

## 2017-05-26 DIAGNOSIS — D509 Iron deficiency anemia, unspecified: Secondary | ICD-10-CM | POA: Diagnosis not present

## 2017-05-26 DIAGNOSIS — I5043 Acute on chronic combined systolic (congestive) and diastolic (congestive) heart failure: Secondary | ICD-10-CM

## 2017-05-26 DIAGNOSIS — Z951 Presence of aortocoronary bypass graft: Secondary | ICD-10-CM | POA: Insufficient documentation

## 2017-05-26 DIAGNOSIS — Z7901 Long term (current) use of anticoagulants: Secondary | ICD-10-CM

## 2017-05-26 DIAGNOSIS — E871 Hypo-osmolality and hyponatremia: Secondary | ICD-10-CM | POA: Diagnosis not present

## 2017-05-26 DIAGNOSIS — Z95810 Presence of automatic (implantable) cardiac defibrillator: Secondary | ICD-10-CM | POA: Insufficient documentation

## 2017-05-26 DIAGNOSIS — J449 Chronic obstructive pulmonary disease, unspecified: Secondary | ICD-10-CM | POA: Insufficient documentation

## 2017-05-26 DIAGNOSIS — F329 Major depressive disorder, single episode, unspecified: Secondary | ICD-10-CM | POA: Diagnosis not present

## 2017-05-26 DIAGNOSIS — I480 Paroxysmal atrial fibrillation: Secondary | ICD-10-CM | POA: Insufficient documentation

## 2017-05-26 DIAGNOSIS — I2510 Atherosclerotic heart disease of native coronary artery without angina pectoris: Secondary | ICD-10-CM | POA: Insufficient documentation

## 2017-05-26 DIAGNOSIS — I5022 Chronic systolic (congestive) heart failure: Secondary | ICD-10-CM

## 2017-05-26 LAB — CBC
HCT: 23.8 % — ABNORMAL LOW (ref 39.0–52.0)
HEMOGLOBIN: 7.6 g/dL — AB (ref 13.0–17.0)
MCH: 27.4 pg (ref 26.0–34.0)
MCHC: 31.9 g/dL (ref 30.0–36.0)
MCV: 85.9 fL (ref 78.0–100.0)
Platelets: 152 10*3/uL (ref 150–400)
RBC: 2.77 MIL/uL — AB (ref 4.22–5.81)
RDW: 22.8 % — ABNORMAL HIGH (ref 11.5–15.5)
WBC: 4.1 10*3/uL (ref 4.0–10.5)

## 2017-05-26 LAB — TYPE AND SCREEN
ABO/RH(D): O POS
ANTIBODY SCREEN: NEGATIVE

## 2017-05-26 LAB — PROTIME-INR
INR: 1.81
PROTHROMBIN TIME: 21.2 s — AB (ref 11.4–15.2)

## 2017-05-26 NOTE — Telephone Encounter (Signed)
Called pt per Dr. Haroldine Laws. Pt scheduled to receive 2 units PC's tomorrow at [facility]. He will then get CBC and INR checked on Monday, 05/30/17 at [facility]   [facility] for appointment asap. Pt last seen there by Dr. Hilarie Fredrickson, his nurse will schedule appt and call Mr. Giannini with time and date.

## 2017-05-26 NOTE — Addendum Note (Signed)
Encounter addended by: Jolaine Artist, MD on: 05/26/2017 10:07 PM<BR>    Actions taken: Visit diagnoses modified, LOS modified, Charge Capture section accepted, Sign clinical note

## 2017-05-26 NOTE — Addendum Note (Signed)
Encounter addended by: Lezlie Octave, RN on: 05/26/2017  5:03 PM<BR>    Actions taken: Vitals modified, Order Reconciliation Section accessed, Sign clinical note

## 2017-05-26 NOTE — Patient Instructions (Addendum)
1.  Report to [facility] for 2 units PC's tomorrow. 2.  Re-check INR Monday, 05/30/17 at [facility]. 3.  Referral to GI to be seen asap. 4.  Will schedule next VAD clinic appt after lab re-check Monday.

## 2017-05-26 NOTE — Addendum Note (Signed)
Encounter addended by: Kerry Dory, CMA on: 05/26/2017 10:59 AM<BR>    Actions taken: Order list changed, Diagnosis association updated

## 2017-05-26 NOTE — Telephone Encounter (Signed)
Pt scheduled to see Amy Esterwood PA 05/30/17@2 :30pm. CHF clinic to notify pt of appt.

## 2017-05-26 NOTE — Addendum Note (Signed)
Encounter addended by: Conrad Allen, NP on: 05/26/2017  4:08 PM<BR>    Actions taken: Vitals modified, Chief Complaint modified, Visit Navigator Flowsheet section accepted, Medication taking status modified, Home Medications modified, Order Reconciliation Section accessed, Pend clinical note

## 2017-05-26 NOTE — Addendum Note (Signed)
Encounter addended by: Kerry Dory, CMA on: 05/26/2017 10:41 AM<BR>    Actions taken: Visit diagnoses modified, Diagnosis association updated, Order list changed

## 2017-05-26 NOTE — Progress Notes (Signed)
Advanced Heart Failure Clinic Note    Referring Provider: Darylene Price, FNP Primary Care: [facility] Primary Cardiologist: Dr Haroldine Laws.  Transplant Center- [facility]  HPI: Ronald Miller. is a 61 y.o. male with history of chronic systolic HF s/p Medtronic ICD 2014, CAD s/p CABG in 2000, HTN, Hx of cocaine abuse, Tobacco abuse, Depression, PAF, and COPD. HM3 placed 09/09/2016.   Admitted 11/2016 with volume overload and low output heart failure. Diuresed with IV lasix and placed on milrinone. Discharged on milrinone.   Admitted 01/21/2017 with volume overload. Mixed venous saturation was low so milrinone was increased to 0.375 mcg. Milrinone was stopped with persistently low mixed venous saturation so dobtuamine was started. He discharged on 0.5 mcg dobutamine. Hospital course complicated by RUE swelling. US showed non occlusive DVT in R brachial vein. PICC removed and later tunneled PICC was placed. Discharge weight 233 pounds.   Underwent ramp RHC on 02/11/17 which showed primarily RV failure with normal output.   VAD speed 5700  On dobutamine 5 mcg/kg/min  RA = 17 RV =  44/18 PA =  41/15 (27) PCW = 16 Fick cardiac output/index = 8.1/3.7 Thermo CO/CI = 6.7/3.0 Ao sat = 95% PA sat = 62%, 62%   Admitted 03/04/17-03/08/17 with recurrent right heart failure and severe hyponatremia. CVP > 20. Treated with tolvaptan and IV lasix. Diuresed 11 pounds. D/c weight 223. Also treated with IV feraheme for iron-deficiency anemia. CT chest negative for outlfow tract obstruction. Sent home with order for IV lasix every Monday and Friday.   Admitted 04/29/17-05/09/17 with melena and recurrent HF. EGD showed ulcers in stomach. H pylori negative. Diuresed. Dobutamine increased from 6 to 7.5 due to low output. D/c home with his sister.   Today he returns for follow up. Complaining of increased fatigue over the last 3 days. Mild dyspnea with exertion. Weight at home 211 pounds. Having  black BMs. Taking all medications.No fever or chills. Appetite improved. He continues to live with sister.   Denies LVAD trauma. Denies driveline trauma, erythema, or drainage. No ICD shocks.   Reports taking Coumadin as prescribed and adherence to anticoagulation based dietary restrictions. Denies bright red blood per rectum or melena, no dark urine or hematuria.   VAD Indication: Destination Therapy- evaluated at [facility]    VAD interrogation & Equipment Management: Speed: 5900 Flow: 5.3  Power: 4.6  PI:3.2   Alarms: no clinical alarms Events: 23-30 PI events  Fixed speed 5900 Low speed limit: 5600   Past Medical History:  Diagnosis Date  . AICD (automatic cardioverter/defibrillator) present   . ASCVD (arteriosclerotic cardiovascular disease)   . Chronic systolic CHF (congestive heart failure) ([place])   . COPD (chronic obstructive pulmonary disease) ([place])   . Coronary artery disease   . Depression   . Diabetes mellitus   . History of cocaine abuse   . Hypertension   . Presence of permanent cardiac pacemaker   . Shortness of breath dyspnea   . Suicidal ideation   . Tobacco abuse     Current Outpatient Prescriptions  Medication Sig Dispense Refill  . amiodarone (PACERONE) 200 MG tablet Take 1 tablet (200 mg total) by mouth daily. 30 tablet 6  . citalopram (CELEXA) 20 MG tablet Take 1 tablet (20 mg total) by mouth daily. 30 tablet 6  . clonazePAM (KLONOPIN) 0.25 MG disintegrating tablet Take 1 tablet (0.25 mg total) by mouth 2 (two) times daily. 60 tablet 0  . digoxin (DIGOX) 0.125 MG tablet  Take 1 tablet (0.125 mg total) by mouth daily. 30 tablet 6  . DOBUTamine (DOBUTREX) 4-5 MG/ML-% infusion Inject 571.8 mcg/min into the vein continuous. PER [place] pharmacy    . docusate sodium (COLACE) 100 MG capsule Take 200 mg by mouth at bedtime.     . gabapentin (NEURONTIN) 100 MG capsule Take 2 capsules (200 mg total) by mouth 2 (two) times daily. 120 capsule 3  . insulin aspart  (NOVOLOG FLEXPEN) 100 UNIT/ML FlexPen Inject 5 Units into the skin 3 (three) times daily with meals. 15 mL 11  . Insulin Glargine (LANTUS SOLOSTAR) 100 UNIT/ML Solostar Pen Inject 20 Units into the skin daily at 10 pm. 15 mL 11  . losartan (COZAAR) 25 MG tablet Take 1 tablet (25 mg total) by mouth daily. 30 tablet 3  . magnesium oxide (MAG-OX) 400 (241.3 Mg) MG tablet Take 1 tablet (400 mg total) by mouth daily. 30 tablet 6  . Multiple Vitamin (MULTIVITAMIN WITH MINERALS) TABS tablet Take 1 tablet by mouth daily.    . ondansetron (ZOFRAN ODT) 4 MG disintegrating tablet Take 1 tablet (4 mg total) by mouth every 8 (eight) hours as needed for nausea. 6 tablet 0  . pantoprazole (PROTONIX) 40 MG tablet Take 1 tablet (40 mg total) by mouth 2 (two) times daily before a meal. 60 tablet 6  . potassium chloride SA (K-DUR,KLOR-CON) 20 MEQ tablet Take 2 tablets (40 mEq total) by mouth 2 (two) times daily. 120 tablet 6  . senna-docusate (SENOKOT S) 8.6-50 MG tablet Take 2 tablets by mouth at bedtime as needed for mild constipation. 30 tablet 6  . sildenafil (REVATIO) 20 MG tablet Take 2 tablets (40 mg total) by mouth 3 (three) times daily. 120 tablet 6  . spironolactone (ALDACTONE) 25 MG tablet Take 1 tablet (25 mg total) by mouth daily. 30 tablet 6  . sucralfate (CARAFATE) 1 GM/10ML suspension Take 10 mLs (1 g total) by mouth 4 (four) times daily -  with meals and at bedtime. 420 mL 6  . torsemide (DEMADEX) 100 MG tablet Take 100 mg in the morning and 50 mg in the evening 60 tablet 6  . traMADol (ULTRAM) 50 MG tablet Take 1 tablet (50 mg total) by mouth 3 (three) times daily as needed for severe pain. 30 tablet 0  . warfarin (COUMADIN) 5 MG tablet Take 10 mg (2 tablets) daily except 5 mg (1 tablet) on Tuesday    . zolpidem (AMBIEN) 10 MG tablet Take 1 tablet (10 mg total) by mouth at bedtime as needed for sleep. 30 tablet 2   No current facility-administered medications for this encounter.     Codeine;  Trazodone and nefazodone; Lipitor [atorvastatin]; and Tape  REVIEW OF SYSTEMS: All systems negative except as listed in HPI, PMH and Problem list.   Vital Signs:  Doppler Pressure 80              Automatc BP: 110/62 (62)  HR: 75 SPO2:97  %  Weight: 221 pounds 219.8 lb w/ eqt Last weight: 219.8  Home weights: 215-220 lbs    Wt Readings from Last 3 Encounters:  05/26/17 221 lb (100.2 kg)  05/09/17 212 lb 4.9 oz (96.3 kg)  04/29/17 233 lb (105.7 kg)     Physical Exam: GENERAL: Well appearing, male who presents to clinic today in no acute distress. HEENT: normal  NECK: Supple, JVP 9-10 .  2+ bilaterally, no bruits.  No lymphadenopathy or thyromegaly appreciated.   CARDIAC:  Mechanical heart  sounds with LVAD hum present. R upper chest tunneled catheter.  LUNGS:  Clear to auscultation bilaterally.  ABDOMEN:  Soft, round, nontender, positive bowel sounds x4.     LVAD exit site: well-healed and incorporated.  Dressing dry and intact.  No erythema or drainage.  Stabilization device present and accurately applied.  Driveline dressing is being changed daily per sterile technique. EXTREMITIES:  Warm and dry, no cyanosis, clubbing, rash or edema  NEUROLOGIC:  Alert and oriented x 4.  Gait steady.  No aphasia.  No dysarthria.  Affect pleasant.      ASSESSMENT AND PLAN:  1. Chronic end-stage biventricular systolic HF: LVEF 78% due to ICM -> s/p Echo 08/24/16 LVEF 15%, RV mild dilated, moderately reduced. --> S/P HM3 LVAD 67/02/4491.  - Complicated by RV failure. Initially on milrinone but stopped due to low mixed venous saturation.  Remains on dobutamine 7.5 mcg.  Volume status stable. Continue current dose of torsemide. - Has been turned down for [facility] at [facility]. He is now motivated to requalify. Encouraged him to stay motivated.  - Continue sildenafil 40 tid.    - No beta blocker with RV failure - Continue digoxin 0.125 mcg daily.  - VAD parameters reviewed personally - stable -  Warfarin with goal INR 2.0 - 2.5 + ASA 81 daily. INR today 1.8 Adjust coumadin. Discussed with Pharm D - Had epistaxis with lovenox, will avoid this in the future.  2. CAD: No CP. 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  - No S/S ischemia  3. DMII:  - Followed by PCP.   - Stressed need for better control as part of transplant guidelines. May benefit from Jardiance 4. Tobacco abuse:  - Reports complete cessation 5. Atrial fibrillation, paroxysmal - In NSR. Continue amio 200 daily 6. Left pleural effusion - underwent repeat thoracentesis in Jan. 2018.  - Stable.  7.  Anxiety/Depression - Continue Celexa 20mg  daily . -Stable.  8. RUE DVT.  - Continue warfarin.  9. Hyponatremia:  -10. Anemia, iron-deficiency:   Hgb slowly trending down. Hgb 7.6 . Set up for [facility] tomorrow at [facility]. Also give 80 mg IV lasix after blood.   Most recent EGD with severe gastritis. Continue PPI and carafate. Biopsies negative. Has received Feraheme  Follow up next week for repeat for labs.  Darrick Grinder, NP  3:49 PM  Patient seen and examined with Darrick Grinder, NP. We discussed all aspects of the encounter. I agree with the assessment and plan as stated above.   Recent EGD with severe gastritis. Continue with black stools despite PPI and carfate. Not taking iron. Hgb dropping slowly. We have arranged for him to go to short stay at [facility] for 2u RBCs tomorrow. We have also arranged f/u with GI for possible capsule endoscopy. INR 1.8 today. Will adjust warfarin. (d/w pharmD) Volume status slightly elevated. Will give one dose IV lasix with RBCs. VAD parameters stable.   Glori Bickers, MD  10:06 PM

## 2017-05-26 NOTE — Progress Notes (Signed)
Patient presents for lab only visit today. Changed to full VAD clinic visit for drop in Hgb and pt c/o feeling more fatigued and having "black stools".  Reports no problems with VAD equipment or concerns with drive line.   Vital Signs:  Doppler Pressure 80  Automatc BP:  110/62 (62) HR:75 SPO2:97  %  Weight: 222.8 lb w/ eqt Last weight: 219.8  lbs   VAD Indication: Destination Therapy- evaluated at [facility]    VAD interrogation & Equipment Management: Speed:5900 Flow: 5.3 Power:4.6 w    PI:3.2  Alarms: no clinical alarms Events: 14, 10, 44, 6 PI events over last few days  Fixed speed 5900 Low speed limit: 5600  Primary Controller:  Replace back up battery in 29 months. Back up controller:   Replace back up battery in 29 months.  Annual Equipment Maintenance on UBC/PM was performed on 09/2016.   I reviewed the LVAD parameters from today and compared the results to the patient's prior recorded data. LVAD interrogation was NEGATIVE for significant power changes, NEGATIVE for clinical alarms and STABLE for PI events/speed drops. No programming changes were made and pump is functioning within specified parameters. Pt is performing daily controller and system monitor self tests along with completing weekly and monthly maintenance for LVAD equipment.  LVAD equipment check completed and is in good working order. Back-up equipment present.   Exit Site Care: Drive line exit site well healed and incorporated. The velour is fully implanted at exit site. Dressing dry and intact. No erythema or drainage. Stabilization device present and accurately applied. Pt denies fever or chills.   Significant Events on VAD Support:  11/30/16> Milrinone gtt at discharge 01/27/17>dobutamine at 5 mcg/kg/mingtt at discharge 03/08/17> RV failure 05/19/17 > dobutamine decreased from 7.5 to 6   Device:Protect Therapies: off; end of service 08/2016 Last check: complete interrogation today per Darnelle Maffucci  (Medtronic). DDD (lower rate 60); BiV pacing with 96% AS-VP   BP & Labs:  MAP 80 - Doppler is reflecting MAP  Hgb 7.6- with S/S of bleeding; pt reporting black stools starting this past Sunday.  Patient Instructions: 1.  Report to [facility] for 2 units PC's tomorrow. 2.  Re-check INR Monday, 05/30/17 at [facility]. 3.  Referral to GI to be seen asap. 4.  Will schedule next VAD clinic appt after lab re-check Monday.   Zada Girt RN Carrier Coordinator   Office: [phone] 24/7 Emergency VAD Pager: [phone]

## 2017-05-27 ENCOUNTER — Ambulatory Visit: Payer: Self-pay

## 2017-05-27 ENCOUNTER — Ambulatory Visit
Admission: RE | Admit: 2017-05-27 | Discharge: 2017-05-27 | Disposition: A | Discharge status: Home / Self Care | Payer: Medicare HMO | Source: Ambulatory Visit | Attending: Internal Medicine | Admitting: Internal Medicine

## 2017-05-27 DIAGNOSIS — D649 Anemia, unspecified: Secondary | ICD-10-CM | POA: Insufficient documentation

## 2017-05-27 LAB — PREPARE RBC (CROSSMATCH)

## 2017-05-27 LAB — GLUCOSE, CAPILLARY
GLUCOSE-CAPILLARY: 241 mg/dL — AB (ref 65–99)
GLUCOSE-CAPILLARY: 191 mg/dL — AB (ref 65–99)

## 2017-05-27 LAB — ABO/RH: ABO/RH(D): O POS

## 2017-05-27 MED ORDER — INSULIN ASPART 100 UNIT/ML ~~LOC~~ SOLN
SUBCUTANEOUS | Status: AC
  Administered 2017-05-27: 5 [IU] via SUBCUTANEOUS
  Filled 2017-05-27: qty 1

## 2017-05-27 MED ORDER — FUROSEMIDE 10 MG/ML IJ SOLN
80.0000 mg | Freq: Once | INTRAMUSCULAR | Status: AC
  Administered 2017-05-27: 80 mg via INTRAVENOUS

## 2017-05-27 MED ORDER — INSULIN ASPART 100 UNIT/ML ~~LOC~~ SOLN
5.0000 [IU] | Freq: Three times a day (TID) | SUBCUTANEOUS | Status: DC
  Administered 2017-05-27 (×2): 5 [IU] via SUBCUTANEOUS

## 2017-05-27 MED ORDER — FUROSEMIDE 10 MG/ML IJ SOLN
INTRAMUSCULAR | Status: AC
  Administered 2017-05-27: 80 mg via INTRAVENOUS
  Filled 2017-05-27: qty 8

## 2017-05-27 MED ORDER — ACETAMINOPHEN 500 MG PO TABS: 1000.0000 mg | ORAL_TABLET | Freq: Once | ORAL | Status: DC

## 2017-05-27 MED ORDER — SODIUM CHLORIDE FLUSH 0.9 % IV SOLN
INTRAVENOUS | Status: AC
  Filled 2017-05-27: qty 10

## 2017-05-27 MED ORDER — SODIUM CHLORIDE 0.9 % IV SOLN: Freq: Once | INTRAVENOUS | Status: DC

## 2017-05-27 MED ORDER — INSULIN ASPART 100 UNIT/ML ~~LOC~~ SOLN
SUBCUTANEOUS | Status: AC
  Filled 2017-05-27: qty 1

## 2017-05-27 NOTE — OR Nursing (Signed)
Patient states he did not take his insulin this morning.

## 2017-05-27 NOTE — Progress Notes (Signed)
Up to Bathroom with assist and via wheelchair.  Moderate amount black stool.

## 2017-05-27 NOTE — OR Nursing (Signed)
Insulin given and patient now vomiting.  Sitting on edge of bed with nausea

## 2017-05-27 NOTE — OR Nursing (Signed)
9:29 transferred to PACU 11/12 for blood transfusion

## 2017-05-28 LAB — BPAM RBC
BLOOD PRODUCT EXPIRATION DATE: 201809222359
Blood Product Expiration Date: 201809222359
ISSUE DATE / TIME: 201808241459
ISSUE DATE / TIME: 201808241150
UNIT TYPE AND RH: 5100
Unit Type and Rh: 5100

## 2017-05-28 LAB — TYPE AND SCREEN
ABO/RH(D): O POS
ANTIBODY SCREEN: NEGATIVE
Unit division: 0
Unit division: 0

## 2017-05-30 ENCOUNTER — Ambulatory Visit (INDEPENDENT_AMBULATORY_CARE_PROVIDER_SITE_OTHER): Payer: Medicare HMO | Admitting: Physician Assistant

## 2017-05-30 ENCOUNTER — Telehealth (HOSPITAL_COMMUNITY): Payer: Self-pay | Admitting: Unknown Physician Specialty

## 2017-05-30 ENCOUNTER — Encounter: Payer: Self-pay | Admitting: Physician Assistant

## 2017-05-30 ENCOUNTER — Ambulatory Visit (HOSPITAL_COMMUNITY)
Admission: RE | Admit: 2017-05-30 | Discharge: 2017-05-30 | Disposition: A | Discharge status: Home / Self Care | Payer: Medicare HMO | Source: Ambulatory Visit | Attending: Cardiology | Admitting: Cardiology

## 2017-05-30 ENCOUNTER — Ambulatory Visit (HOSPITAL_COMMUNITY): Payer: Self-pay | Admitting: Pharmacist

## 2017-05-30 ENCOUNTER — Telehealth (HOSPITAL_COMMUNITY): Payer: Self-pay | Admitting: *Deleted

## 2017-05-30 VITALS — BP 82/0 | Ht 69.0 in | Wt 220.0 lb

## 2017-05-30 DIAGNOSIS — Z7901 Long term (current) use of anticoagulants: Secondary | ICD-10-CM | POA: Insufficient documentation

## 2017-05-30 DIAGNOSIS — D500 Iron deficiency anemia secondary to blood loss (chronic): Secondary | ICD-10-CM | POA: Diagnosis not present

## 2017-05-30 DIAGNOSIS — K31811 Angiodysplasia of stomach and duodenum with bleeding: Secondary | ICD-10-CM

## 2017-05-30 DIAGNOSIS — K5730 Diverticulosis of large intestine without perforation or abscess without bleeding: Secondary | ICD-10-CM

## 2017-05-30 DIAGNOSIS — Z95811 Presence of heart assist device: Secondary | ICD-10-CM

## 2017-05-30 DIAGNOSIS — K921 Melena: Secondary | ICD-10-CM | POA: Diagnosis not present

## 2017-05-30 DIAGNOSIS — I5043 Acute on chronic combined systolic (congestive) and diastolic (congestive) heart failure: Secondary | ICD-10-CM

## 2017-05-30 LAB — PROTIME-INR
INR: 1.79
PROTHROMBIN TIME: 21 s — AB (ref 11.4–15.2)

## 2017-05-30 LAB — CBC
HCT: 27.6 % — ABNORMAL LOW (ref 39.0–52.0)
HEMOGLOBIN: 8.9 g/dL — AB (ref 13.0–17.0)
MCH: 28.1 pg (ref 26.0–34.0)
MCHC: 32.2 g/dL (ref 30.0–36.0)
MCV: 87.1 fL (ref 78.0–100.0)
Platelets: 171 10*3/uL (ref 150–400)
RBC: 3.17 MIL/uL — ABNORMAL LOW (ref 4.22–5.81)
RDW: 20.8 % — AB (ref 11.5–15.5)
WBC: 7 10*3/uL (ref 4.0–10.5)

## 2017-05-30 NOTE — Progress Notes (Signed)
Patient presents to clinic today for drive line exit wound care. Existing VAD dressing removed and site care performed using sterile technique. Drive line exit site cleaned with Chlora prep applicators x 2, allowed to dry, and Sorbaview dressing with bio patch re-applied. Exit site healed and incorporated, the velour is fully implanted at exit site. No redness, tenderness, drainage, foul odor or rash noted. Drive line anchor re-applied. Pt denies fever or chills.   Return in 1 week for another dressing change per standard of care. INR to be repeated in 1-2 weeks pending results per anticoagulation protocol.   

## 2017-05-30 NOTE — Patient Instructions (Addendum)
We will call you this week regarding admission for evaluation of your melena. This may include capsule endoscopy.  If you are age 60 or older, your body mass index should be between 23-30. Your Body mass index is 32.49 kg/m. If this is out of the aforementioned range listed, please consider follow up with your Primary Care Provider.  If you are age 44 or younger, your body mass index should be between 19-25. Your Body mass index is 32.49 kg/m. If this is out of the aformentioned range listed, please consider follow up with your Primary Care Provider.

## 2017-05-30 NOTE — Addendum Note (Signed)
Encounter addended by: Lezlie Octave, RN on: 05/30/2017  1:34 PM<BR>    Actions taken: Sign clinical note
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
## 2017-05-30 NOTE — Telephone Encounter (Signed)
Pt called to ask if he can have lab work drawn here (instead of [facility]) today. States he has appt with [facility] today at 2:30 and will be coming here anyway. Pt states he received 2 units blood last Friday and is feeling "much better" today. States he still has some "dark" stools, but seem to be "turning brown".  VAD parameters are as follows:  Speed 5900, Flow 5.6, PI 2.6, Power 4.7w; denies any VAD alarms. Informed pt he can come here for labs and we will contact him if his Hgb is down again. Pt verbalized understanding of same.

## 2017-05-30 NOTE — Telephone Encounter (Signed)
Received call from [facility] stating that Ronald Miller will need a capsule study. Admission called in to patient placement for 05/31/17. Patient placement will call the VAD pager with a bed assignment, VAD coordinators will contact the pt. After admission the heart failure team will consult GI for capsule study.

## 2017-05-31 ENCOUNTER — Encounter (HOSPITAL_COMMUNITY): Payer: Self-pay

## 2017-05-31 ENCOUNTER — Telehealth (HOSPITAL_COMMUNITY): Payer: Self-pay | Admitting: *Deleted

## 2017-05-31 ENCOUNTER — Inpatient Hospital Stay (HOSPITAL_COMMUNITY)
Admission: RE | Admit: 2017-05-31 | Discharge: 2017-06-03 | DRG: 378 | Disposition: A | Discharge status: Home Health | Payer: Medicare HMO | Source: Ambulatory Visit | Attending: Internal Medicine | Admitting: Internal Medicine

## 2017-05-31 DIAGNOSIS — D649 Anemia, unspecified: Secondary | ICD-10-CM | POA: Diagnosis present

## 2017-05-31 DIAGNOSIS — R195 Other fecal abnormalities: Secondary | ICD-10-CM | POA: Diagnosis not present

## 2017-05-31 DIAGNOSIS — Z95811 Presence of heart assist device: Secondary | ICD-10-CM | POA: Diagnosis not present

## 2017-05-31 DIAGNOSIS — F419 Anxiety disorder, unspecified: Secondary | ICD-10-CM | POA: Diagnosis present

## 2017-05-31 DIAGNOSIS — Z7901 Long term (current) use of anticoagulants: Secondary | ICD-10-CM

## 2017-05-31 DIAGNOSIS — Z87891 Personal history of nicotine dependence: Secondary | ICD-10-CM

## 2017-05-31 DIAGNOSIS — K2960 Other gastritis without bleeding: Secondary | ICD-10-CM | POA: Diagnosis present

## 2017-05-31 DIAGNOSIS — Z794 Long term (current) use of insulin: Secondary | ICD-10-CM

## 2017-05-31 DIAGNOSIS — I5084 End stage heart failure: Secondary | ICD-10-CM | POA: Diagnosis present

## 2017-05-31 DIAGNOSIS — R791 Abnormal coagulation profile: Secondary | ICD-10-CM | POA: Diagnosis not present

## 2017-05-31 DIAGNOSIS — D62 Acute posthemorrhagic anemia: Secondary | ICD-10-CM | POA: Diagnosis present

## 2017-05-31 DIAGNOSIS — I5022 Chronic systolic (congestive) heart failure: Secondary | ICD-10-CM

## 2017-05-31 DIAGNOSIS — K921 Melena: Principal | ICD-10-CM | POA: Diagnosis present

## 2017-05-31 DIAGNOSIS — Z833 Family history of diabetes mellitus: Secondary | ICD-10-CM

## 2017-05-31 DIAGNOSIS — E119 Type 2 diabetes mellitus without complications: Secondary | ICD-10-CM | POA: Diagnosis present

## 2017-05-31 DIAGNOSIS — E871 Hypo-osmolality and hyponatremia: Secondary | ICD-10-CM | POA: Diagnosis present

## 2017-05-31 DIAGNOSIS — D509 Iron deficiency anemia, unspecified: Secondary | ICD-10-CM | POA: Diagnosis present

## 2017-05-31 DIAGNOSIS — K922 Gastrointestinal hemorrhage, unspecified: Secondary | ICD-10-CM | POA: Diagnosis not present

## 2017-05-31 DIAGNOSIS — Z8249 Family history of ischemic heart disease and other diseases of the circulatory system: Secondary | ICD-10-CM

## 2017-05-31 DIAGNOSIS — I5082 Biventricular heart failure: Secondary | ICD-10-CM | POA: Diagnosis present

## 2017-05-31 DIAGNOSIS — I480 Paroxysmal atrial fibrillation: Secondary | ICD-10-CM | POA: Diagnosis present

## 2017-05-31 DIAGNOSIS — J449 Chronic obstructive pulmonary disease, unspecified: Secondary | ICD-10-CM | POA: Diagnosis present

## 2017-05-31 DIAGNOSIS — I110 Hypertensive heart disease with heart failure: Secondary | ICD-10-CM | POA: Diagnosis present

## 2017-05-31 DIAGNOSIS — I998 Other disorder of circulatory system: Secondary | ICD-10-CM | POA: Diagnosis present

## 2017-05-31 DIAGNOSIS — D500 Iron deficiency anemia secondary to blood loss (chronic): Secondary | ICD-10-CM | POA: Diagnosis not present

## 2017-05-31 DIAGNOSIS — Z86718 Personal history of other venous thrombosis and embolism: Secondary | ICD-10-CM

## 2017-05-31 DIAGNOSIS — Z951 Presence of aortocoronary bypass graft: Secondary | ICD-10-CM

## 2017-05-31 DIAGNOSIS — F329 Major depressive disorder, single episode, unspecified: Secondary | ICD-10-CM | POA: Diagnosis present

## 2017-05-31 DIAGNOSIS — I2510 Atherosclerotic heart disease of native coronary artery without angina pectoris: Secondary | ICD-10-CM | POA: Diagnosis present

## 2017-05-31 LAB — COMPREHENSIVE METABOLIC PANEL
ALBUMIN: 3.5 g/dL (ref 3.5–5.0)
ALT: 26 U/L (ref 17–63)
ANION GAP: 10 (ref 5–15)
AST: 23 U/L (ref 15–41)
Alkaline Phosphatase: 77 U/L (ref 38–126)
BUN: 40 mg/dL — AB (ref 6–20)
CHLORIDE: 92 mmol/L — AB (ref 101–111)
CO2: 30 mmol/L (ref 22–32)
Calcium: 9.1 mg/dL (ref 8.9–10.3)
Creatinine, Ser: 1.66 mg/dL — ABNORMAL HIGH (ref 0.61–1.24)
GFR calc Af Amer: 50 mL/min — ABNORMAL LOW (ref >60)
GFR, EST NON AFRICAN AMERICAN: 43 mL/min — AB (ref >60)
GLUCOSE: 204 mg/dL — AB (ref 65–99)
POTASSIUM: 4.8 mmol/L (ref 3.5–5.1)
Sodium: 132 mmol/L — ABNORMAL LOW (ref 135–145)
TOTAL PROTEIN: 7.1 g/dL (ref 6.5–8.1)
Total Bilirubin: 0.6 mg/dL (ref 0.3–1.2)

## 2017-05-31 LAB — HEPARIN LEVEL (UNFRACTIONATED): HEPARIN UNFRACTIONATED: 0.14 [IU]/mL — AB (ref 0.30–0.70)

## 2017-05-31 LAB — CBC WITH DIFFERENTIAL/PLATELET
BASOS ABS: 0 10*3/uL (ref 0.0–0.1)
Basophils Relative: 0 %
EOS PCT: 1 %
Eosinophils Absolute: 0.1 10*3/uL (ref 0.0–0.7)
HEMATOCRIT: 28.1 % — AB (ref 39.0–52.0)
HEMOGLOBIN: 8.8 g/dL — AB (ref 13.0–17.0)
LYMPHS ABS: 0.7 10*3/uL (ref 0.7–4.0)
LYMPHS PCT: 8 %
MCH: 27.4 pg (ref 26.0–34.0)
MCHC: 31.3 g/dL (ref 30.0–36.0)
MCV: 87.5 fL (ref 78.0–100.0)
Monocytes Absolute: 0.6 10*3/uL (ref 0.1–1.0)
Monocytes Relative: 7 %
NEUTROS ABS: 6.8 10*3/uL (ref 1.7–7.7)
Neutrophils Relative %: 84 %
PLATELETS: 186 10*3/uL (ref 150–400)
RBC: 3.21 MIL/uL — AB (ref 4.22–5.81)
RDW: 20 % — ABNORMAL HIGH (ref 11.5–15.5)
WBC: 8.1 10*3/uL (ref 4.0–10.5)

## 2017-05-31 LAB — GLUCOSE, CAPILLARY
Glucose-Capillary: 207 mg/dL — ABNORMAL HIGH (ref 65–99)
Glucose-Capillary: 184 mg/dL — ABNORMAL HIGH (ref 65–99)
Glucose-Capillary: 167 mg/dL — ABNORMAL HIGH (ref 65–99)

## 2017-05-31 LAB — LACTATE DEHYDROGENASE: LDH: 187 U/L (ref 98–192)

## 2017-05-31 LAB — PROTIME-INR
INR: 1.75
PROTHROMBIN TIME: 20.3 s — AB (ref 11.4–15.2)

## 2017-05-31 LAB — TYPE AND SCREEN
ABO/RH(D): O POS
ANTIBODY SCREEN: NEGATIVE

## 2017-05-31 MED ORDER — LOSARTAN POTASSIUM 25 MG PO TABS
25.0000 mg | ORAL_TABLET | Freq: Every day | ORAL | Status: DC
  Administered 2017-05-31 – 2017-06-02 (×3): 25 mg via ORAL
  Filled 2017-05-31 (×3): qty 1

## 2017-05-31 MED ORDER — ADULT MULTIVITAMIN W/MINERALS CH
1.0000 | ORAL_TABLET | Freq: Every day | ORAL | Status: DC
  Administered 2017-05-31 – 2017-06-03 (×4): 1 via ORAL
  Filled 2017-05-31 (×4): qty 1

## 2017-05-31 MED ORDER — ZOLPIDEM TARTRATE 5 MG PO TABS
10.0000 mg | ORAL_TABLET | Freq: Every evening | ORAL | Status: DC | PRN
  Administered 2017-06-02 (×2): 10 mg via ORAL
  Filled 2017-05-31 (×2): qty 2

## 2017-05-31 MED ORDER — DOBUTAMINE IN D5W 4-5 MG/ML-% IV SOLN
6.0000 ug/kg/min | INTRAVENOUS | Status: DC
  Administered 2017-05-31 – 2017-06-02 (×3): 6 ug/kg/min via INTRAVENOUS
  Filled 2017-05-31 (×3): qty 250

## 2017-05-31 MED ORDER — PEG-KCL-NACL-NASULF-NA ASC-C 100 G PO SOLR
0.5000 | Freq: Once | ORAL | Status: AC
  Administered 2017-05-31: 100 g via ORAL
  Filled 2017-05-31: qty 1

## 2017-05-31 MED ORDER — SUCRALFATE 1 GM/10ML PO SUSP
1.0000 g | Freq: Three times a day (TID) | ORAL | Status: DC
  Administered 2017-05-31 – 2017-06-03 (×13): 1 g via ORAL
  Filled 2017-05-31 (×13): qty 10

## 2017-05-31 MED ORDER — MAGNESIUM OXIDE 400 (241.3 MG) MG PO TABS
400.0000 mg | ORAL_TABLET | Freq: Every day | ORAL | Status: DC
  Administered 2017-05-31 – 2017-06-03 (×4): 400 mg via ORAL
  Filled 2017-05-31 (×4): qty 1

## 2017-05-31 MED ORDER — TORSEMIDE 20 MG PO TABS: 50.0000 mg | ORAL_TABLET | Freq: Every day | ORAL | Status: DC

## 2017-05-31 MED ORDER — INSULIN ASPART 100 UNIT/ML ~~LOC~~ SOLN
5.0000 [IU] | Freq: Three times a day (TID) | SUBCUTANEOUS | Status: DC
  Administered 2017-05-31 – 2017-06-03 (×9): 5 [IU] via SUBCUTANEOUS

## 2017-05-31 MED ORDER — PANTOPRAZOLE SODIUM 40 MG PO TBEC
40.0000 mg | DELAYED_RELEASE_TABLET | Freq: Two times a day (BID) | ORAL | Status: DC
  Administered 2017-05-31 – 2017-06-03 (×6): 40 mg via ORAL
  Filled 2017-05-31 (×6): qty 1

## 2017-05-31 MED ORDER — POTASSIUM CHLORIDE CRYS ER 20 MEQ PO TBCR
40.0000 meq | EXTENDED_RELEASE_TABLET | Freq: Two times a day (BID) | ORAL | Status: DC
  Administered 2017-05-31 – 2017-06-03 (×7): 40 meq via ORAL
  Filled 2017-05-31 (×7): qty 2

## 2017-05-31 MED ORDER — DOCUSATE SODIUM 100 MG PO CAPS
200.0000 mg | ORAL_CAPSULE | Freq: Every day | ORAL | Status: DC
  Administered 2017-06-01 – 2017-06-02 (×2): 200 mg via ORAL
  Filled 2017-05-31 (×3): qty 2

## 2017-05-31 MED ORDER — SODIUM CHLORIDE 0.9 % IV SOLN
INTRAVENOUS | Status: DC
  Administered 2017-05-31: 23:00:00 via INTRAVENOUS

## 2017-05-31 MED ORDER — SILDENAFIL CITRATE 20 MG PO TABS
40.0000 mg | ORAL_TABLET | Freq: Three times a day (TID) | ORAL | Status: DC
  Administered 2017-05-31 – 2017-06-03 (×10): 40 mg via ORAL
  Filled 2017-05-31 (×10): qty 2

## 2017-05-31 MED ORDER — SENNOSIDES-DOCUSATE SODIUM 8.6-50 MG PO TABS: 2.0000 | ORAL_TABLET | Freq: Every evening | ORAL | Status: DC | PRN

## 2017-05-31 MED ORDER — SPIRONOLACTONE 25 MG PO TABS
25.0000 mg | ORAL_TABLET | Freq: Every day | ORAL | Status: DC
  Administered 2017-05-31 – 2017-06-03 (×4): 25 mg via ORAL
  Filled 2017-05-31 (×4): qty 1

## 2017-05-31 MED ORDER — INSULIN GLARGINE 100 UNIT/ML ~~LOC~~ SOLN
20.0000 [IU] | Freq: Every day | SUBCUTANEOUS | Status: DC
  Administered 2017-05-31: 20 [IU] via SUBCUTANEOUS
  Filled 2017-05-31: qty 0.2

## 2017-05-31 MED ORDER — TORSEMIDE 20 MG PO TABS
50.0000 mg | ORAL_TABLET | Freq: Every day | ORAL | Status: DC
  Administered 2017-06-01 – 2017-06-02 (×2): 50 mg via ORAL
  Filled 2017-05-31 (×2): qty 3

## 2017-05-31 MED ORDER — INSULIN ASPART 100 UNIT/ML ~~LOC~~ SOLN
0.0000 [IU] | Freq: Three times a day (TID) | SUBCUTANEOUS | Status: DC
  Administered 2017-05-31: 3 [IU] via SUBCUTANEOUS
  Administered 2017-06-01: 5 [IU] via SUBCUTANEOUS
  Administered 2017-06-01: 2 [IU] via SUBCUTANEOUS
  Administered 2017-06-02: 3 [IU] via SUBCUTANEOUS
  Administered 2017-06-02 (×2): 2 [IU] via SUBCUTANEOUS
  Administered 2017-06-03 (×2): 3 [IU] via SUBCUTANEOUS

## 2017-05-31 MED ORDER — ONDANSETRON 4 MG PO TBDP: 4.0000 mg | ORAL_TABLET | Freq: Three times a day (TID) | ORAL | Status: DC | PRN

## 2017-05-31 MED ORDER — CITALOPRAM HYDROBROMIDE 20 MG PO TABS
20.0000 mg | ORAL_TABLET | Freq: Every day | ORAL | Status: DC
  Administered 2017-05-31 – 2017-06-03 (×4): 20 mg via ORAL
  Filled 2017-05-31 (×4): qty 1

## 2017-05-31 MED ORDER — HEPARIN (PORCINE) IN NACL 100-0.45 UNIT/ML-% IJ SOLN
1900.0000 [IU]/h | INTRAMUSCULAR | Status: DC
  Administered 2017-05-31: 1700 [IU]/h via INTRAVENOUS
  Administered 2017-06-01: 1900 [IU]/h via INTRAVENOUS
  Filled 2017-05-31 (×2): qty 250

## 2017-05-31 MED ORDER — DIGOXIN 125 MCG PO TABS
0.1250 mg | ORAL_TABLET | Freq: Every day | ORAL | Status: DC
  Administered 2017-05-31 – 2017-06-03 (×4): 0.125 mg via ORAL
  Filled 2017-05-31 (×4): qty 1

## 2017-05-31 MED ORDER — GABAPENTIN 100 MG PO CAPS
200.0000 mg | ORAL_CAPSULE | Freq: Two times a day (BID) | ORAL | Status: DC
  Administered 2017-05-31 – 2017-06-03 (×7): 200 mg via ORAL
  Filled 2017-05-31 (×7): qty 2

## 2017-05-31 MED ORDER — ACETAMINOPHEN 325 MG PO TABS
650.0000 mg | ORAL_TABLET | ORAL | Status: DC | PRN
  Administered 2017-06-02: 650 mg via ORAL
  Filled 2017-05-31: qty 2

## 2017-05-31 MED ORDER — CLONAZEPAM 0.125 MG PO TBDP
0.2500 mg | ORAL_TABLET | Freq: Two times a day (BID) | ORAL | Status: DC
  Administered 2017-05-31 (×2): 0.25 mg via ORAL
  Filled 2017-05-31 (×3): qty 2

## 2017-05-31 MED ORDER — AMIODARONE HCL 200 MG PO TABS
200.0000 mg | ORAL_TABLET | Freq: Every day | ORAL | Status: DC
  Administered 2017-05-31 – 2017-06-03 (×4): 200 mg via ORAL
  Filled 2017-05-31 (×4): qty 1

## 2017-05-31 MED ORDER — ONDANSETRON HCL 4 MG/2ML IJ SOLN: 4.0000 mg | Freq: Four times a day (QID) | INTRAMUSCULAR | Status: DC | PRN

## 2017-05-31 MED ORDER — TORSEMIDE 20 MG PO TABS
100.0000 mg | ORAL_TABLET | Freq: Every day | ORAL | Status: DC
  Administered 2017-05-31 – 2017-06-02 (×3): 100 mg via ORAL
  Filled 2017-05-31 (×3): qty 5

## 2017-05-31 MED ORDER — TRAMADOL HCL 50 MG PO TABS
50.0000 mg | ORAL_TABLET | Freq: Three times a day (TID) | ORAL | Status: DC | PRN
  Administered 2017-06-01 (×2): 50 mg via ORAL
  Filled 2017-05-31 (×2): qty 1

## 2017-05-31 NOTE — Progress Notes (Addendum)
ANTICOAGULATION CONSULT NOTE - Follow Up Consult  Pharmacy Consult for Heparin Indication: LVAD  Allergies  Allergen Reactions  . Codeine Nausea And Vomiting and Other (See Comments)    In high doses  . Trazodone And Nefazodone Other (See Comments)    Dizziness with subsequent fall   . Lipitor [Atorvastatin] Nausea Only and Other (See Comments)    Nausea with high doses, tolerates 20mg  dose (08/22/16)  . Tape Other (See Comments)    Paper tape is ok    Patient Measurements: Height: 5\' 9"  (175.3 cm) Weight: 220 lb 10.9 oz (100.1 kg) (with LVAD batteries) IBW/kg (Calculated) : 70.7 Heparin Dosing Weight: 92kg  Vital Signs: Temp: 97.5 F (36.4 C) (08/28 1233) Temp Source: Oral (08/28 1233) BP: 101/82 (08/28 1233) Pulse Rate: 68 (08/28 1059)  Labs:  Recent Labs  05/30/17 1152 05/31/17 1034  HGB 8.9* 8.8*  HCT 27.6* 28.1*  PLT 171 186  LABPROT 21.0* 20.3*  INR 1.79 1.75  CREATININE  --  1.66*    Estimated Creatinine Clearance: 55.2 mL/min (A) (by C-G formula based on SCr of 1.66 mg/dL (H)).   Medical History: Past Medical History:  Diagnosis Date  . AICD (automatic cardioverter/defibrillator) present   . ASCVD (arteriosclerotic cardiovascular disease)   . Chronic systolic CHF (congestive heart failure) ([place])   . COPD (chronic obstructive pulmonary disease) ([place])   . Coronary artery disease   . Depression   . Diabetes mellitus   . History of cocaine abuse   . Hypertension   . Presence of permanent cardiac pacemaker   . Shortness of breath dyspnea   . Suicidal ideation   . Tobacco abuse    Assessment: 61yom on coumadin pta for his LVAD, being admitted for a capsule endoscopy to evaluate melena with symptomatic anemia. INR 1.79 yesterday in clinic and he took 15mg  last night. INR remains subtherapeutic today at 1.75. Plan is to hold coumadin and start heparin. Will aim for lower goal in setting of melena. Previously therapeutic on 1700 units/hr  But tonight HL  < goal 0.14. Hgb 8.8, plts 186, LDH 187.  PTA coumadin dose: 10mg  daily except 5mg  on Tuesday - last dose 8/27  Goal of Therapy:  Heparin level 0.3-0.5 units/ml Monitor platelets by anticoagulation protocol: Yes   Plan:  1) Increase heparin 1900 units/hr  2) Daily heparin level and CBC   Bonnita Nasuti Pharm.D. CPP, BCPS Clinical Pharmacist [phone] 05/31/2017 9:53 PM

## 2017-05-31 NOTE — Progress Notes (Signed)
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 Prescott Gum.   Recent admit on 7/31> enteroscopy positive for stomach ulcers. Carafate started.   Vital signs: HR: 78vpaced Doppler Pressure: 78 Automatic BP: unable to obtain O2 Sat: 95RA Wt: 28  LVAD interrogation reveals:  Speed:5900 Flow: 5.7 Power: 5 PI: 3 Alarms: none Events: none Fixed speed: 5900 Low speed limit: 5600 HCT: 28  Drive Line: weekly  Labs:  LDH trend: 208  INR trend: 1.75  Anticoagulation Plan: -INR Goal: 2-2.5 -ASA Dose: 81 mg  Gtts: Dobutamine 6 mcg   Plan/Recommendations:   1. Plan for capsule study today per GI 2. Please page VAD pager with any concerns  Balinda Quails RN, [place] Coordinator 24/7 pager [phone]

## 2017-05-31 NOTE — Telephone Encounter (Signed)
Called patient at [phone] on 05/31/17 to inform him of his bed placement. Patient stated he was already on the way and will arrive to admitting after Valet parking the car as instructed. VAD cart delivered to his room on 2C.  Balinda Quails RN, VAD Coordinator 24/7 pager [phone]

## 2017-05-31 NOTE — Progress Notes (Signed)
    Progress Note   Subjective  Pt with ongoing black stools Admitted for VCE  No new complaints since seeing Amy Esterwood, PAC in our office yesterday    Objective  Vital signs in last 24 hours: Temp:  [97.5 F (36.4 C)-97.9 F (36.6 C)] 97.5 F (36.4 C) (08/28 1233) Pulse Rate:  [68-78] 68 (08/28 1059) Resp:  [19-21] 19 (08/28 1233) BP: (66-101)/(54-82) 101/82 (08/28 1233) SpO2:  [95 %] 95 % (08/28 0943) Weight:  [220 lb 10.9 oz (100.1 kg)] 220 lb 10.9 oz (100.1 kg) (08/28 1233)    General: Alert, well-developed, in NAD Heart:  LVAD hum Chest: Clear to ascultation bilaterally Abdomen:  Soft, nontender and nondistended. Normal bowel sounds, without guarding, and without rebound.   Extremities:  Without edema. Neurologic:  Alert and  oriented x4; grossly normal neurologically. Psych:  Alert and cooperative. Normal mood and affect.  Intake/Output from previous day: No intake/output data recorded. Intake/Output this shift: Total I/O In: 480 [P.O.:480] Out: -   Lab Results:  Recent Labs  05/30/17 1152 05/31/17 1034  WBC 7.0 8.1  HGB 8.9* 8.8*  HCT 27.6* 28.1*  PLT 171 186   BMET  Recent Labs  05/31/17 1034  NA 132*  K 4.8  CL 92*  CO2 30  GLUCOSE 204*  BUN 40*  CREATININE 1.66*  CALCIUM 9.1   LFT  Recent Labs  05/31/17 1034  PROT 7.1  ALBUMIN 3.5  AST 23  ALT 26  ALKPHOS 77  BILITOT 0.6   PT/INR  Recent Labs  05/30/17 1152 05/31/17 1034  LABPROT 21.0* 20.3*  INR 1.79 1.75   Hepatitis Panel No results for input(s): HEPBSAG, HCVAB, HEPAIGM, HEPBIGM in the last 72 hours.  SBE July 2018, Colon Nov 2017 reviewed   Assessment & Plan  61 yo with chronic systolic CHF with LVAD in place here with ongoing GI blood loss/melena of unclear source  1. Melena/progressive anemia -- plan video capsule endo tomorrow morning while here.  Would hold warfarin for now in the event endoscopic procedure needed soon.  I am okay with heparin gtt if  desired by heart failure service. Continue PPI given hx of gastritis 1/2 colon prep tonight to clear small bowel as he had regular lunch today Further recs after capsule read.  If deep bleeding present in small bowel will likely need double balloon enteroscopy at [facility]    Active Problems:   Anemia     LOS: 1 day   Phylisha Dix M  05/31/2017, 4:56 PM

## 2017-05-31 NOTE — Progress Notes (Signed)
[facility]  Mr. Ronald Miller is a longstanding home Dobutamine/VAD pt with [facility].  [facility] hospital team will follow pt while inpatient to support [facility] needs at [place].   If patient discharges after hours, please call [phone].   [name] 05/31/2017, 1:35 PM

## 2017-05-31 NOTE — H&P (Signed)
Advanced Heart Failure VAD History and Physical Note   Reason for Admission: Anemia  HPI:    Ronald Miller. is a 61 y.o. male male with history of chronic systolic HF s/p Medtronic ICD 2014, CAD s/p CABG in 2000, HTN, Hx of cocaine abuse, Tobacco abuse, Depression, PAF, and COPD. HM3 placed 09/09/2016.   Admitted 04/29/17-05/09/17 with melena and recurrent HF. EGD showed ulcers in stomach. H pylori negative. Diuresed. Dobutamine increased from 6 to 7.5 due to low output. D/c home with his sister.   Seen in clinic 05/26/17. Continue to have black BMs. Seen in GI office yesterday with 2 wees of melena. He has not had any dysphagia or N/V. He has felt very fatigued. He does have mild lightheadedness with standing. No SOB.   Admitted for observation for inpatient only Capsule endoscopy.  LVAD INTERROGATION:  HeartMate II LVAD:  Flow 5.6 liters/min, speed 5900, power 4.7, PI 2.6. Occasional PI event.    Review of systems complete and found to be negative unless listed in HPI.    Home Medications Prior to Admission medications   Medication Sig Start Date End Date Taking? Authorizing Provider  amiodarone (PACERONE) 200 MG tablet Take 1 tablet (200 mg total) by mouth daily. 05/09/17   Clegg, Amy D, NP  citalopram (CELEXA) 20 MG tablet Take 1 tablet (20 mg total) by mouth daily. 05/09/17   Clegg, Amy D, NP  clonazePAM (KLONOPIN) 0.25 MG disintegrating tablet Take 1 tablet (0.25 mg total) by mouth 2 (two) times daily. 05/18/17   Clegg, Amy D, NP  digoxin (DIGOX) 0.125 MG tablet Take 1 tablet (0.125 mg total) by mouth daily. 05/09/17   Clegg, Amy D, NP  DOBUTamine (DOBUTREX) 4-5 MG/ML-% infusion Inject 571.8 mcg/min into the vein continuous. PER [facility] pharmacy 05/20/17   Samiha Denapoli, Shaune Pascal, MD  docusate sodium (COLACE) 100 MG capsule Take 200 mg by mouth at bedtime.     [provider]  gabapentin (NEURONTIN) 100 MG capsule Take 2 capsules (200 mg total) by mouth 2 (two) times daily. 05/09/17    Clegg, Amy D, NP  insulin aspart (NOVOLOG FLEXPEN) 100 UNIT/ML FlexPen Inject 5 Units into the skin 3 (three) times daily with meals. 05/09/17   Clegg, Amy D, NP  Insulin Glargine (LANTUS SOLOSTAR) 100 UNIT/ML Solostar Pen Inject 20 Units into the skin daily at 10 pm. 05/09/17   Clegg, Amy D, NP  losartan (COZAAR) 25 MG tablet Take 1 tablet (25 mg total) by mouth daily. 05/09/17 08/07/17  Clegg, Amy D, NP  magnesium oxide (MAG-OX) 400 (241.3 Mg) MG tablet Take 1 tablet (400 mg total) by mouth daily. 05/09/17   Clegg, Amy D, NP  Multiple Vitamin (MULTIVITAMIN WITH MINERALS) TABS tablet Take 1 tablet by mouth daily.    [provider]  ondansetron (ZOFRAN ODT) 4 MG disintegrating tablet Take 1 tablet (4 mg total) by mouth every 8 (eight) hours as needed for nausea. 04/10/17   Tanna Furry, MD  pantoprazole (PROTONIX) 40 MG tablet Take 1 tablet (40 mg total) by mouth 2 (two) times daily before a meal. 05/09/17   Clegg, Amy D, NP  potassium chloride SA (K-DUR,KLOR-CON) 20 MEQ tablet Take 2 tablets (40 mEq total) by mouth 2 (two) times daily. 05/09/17   Clegg, Amy D, NP  senna-docusate (SENOKOT S) 8.6-50 MG tablet Take 2 tablets by mouth at bedtime as needed for mild constipation. 05/09/17   Clegg, Amy D, NP  sildenafil (REVATIO) 20 MG tablet Take  2 tablets (40 mg total) by mouth 3 (three) times daily. 05/09/17   Clegg, Amy D, NP  spironolactone (ALDACTONE) 25 MG tablet Take 1 tablet (25 mg total) by mouth daily. 05/09/17   Clegg, Amy D, NP  sucralfate (CARAFATE) 1 GM/10ML suspension Take 10 mLs (1 g total) by mouth 4 (four) times daily -  with meals and at bedtime. 05/09/17   Darrick Grinder D, NP  torsemide (DEMADEX) 100 MG tablet Take 100 mg in the morning and 50 mg in the evening 05/19/17   Hindy Perrault, Shaune Pascal, MD  traMADol (ULTRAM) 50 MG tablet Take 1 tablet (50 mg total) by mouth 3 (three) times daily as needed for severe pain. 04/15/17   Clegg, Amy D, NP  warfarin (COUMADIN) 5 MG tablet Take 10 mg (2 tablets) daily  except 5 mg (1 tablet) on Tuesday    [provider]  zolpidem (AMBIEN) 10 MG tablet Take 1 tablet (10 mg total) by mouth at bedtime as needed for sleep. 05/24/17   Ramon Zanders, Shaune Pascal, MD    Past Medical History: Past Medical History:  Diagnosis Date  . AICD (automatic cardioverter/defibrillator) present   . ASCVD (arteriosclerotic cardiovascular disease)   . Chronic systolic CHF (congestive heart failure) ([place])   . COPD (chronic obstructive pulmonary disease) ([place])   . Coronary artery disease   . Depression   . Diabetes mellitus   . History of cocaine abuse   . Hypertension   . Presence of permanent cardiac pacemaker   . Shortness of breath dyspnea   . Suicidal ideation   . Tobacco abuse      Past Surgical History: Past Surgical History:  Procedure Laterality Date  . CARDIAC CATHETERIZATION N/A 02/12/2016   Procedure: Right/Left Heart Cath and Coronary/Graft Angiography;  Surgeon: Jolaine Artist, MD;  Location: [place] [facility];  Service: Cardiovascular;  Laterality: N/A;  . CARDIAC CATHETERIZATION N/A 03/22/2016   Procedure: Right Heart Cath;  Surgeon: Jolaine Artist, MD;  Location: [place] [facility];  Service: Cardiovascular;  Laterality: N/A;  . CARDIAC CATHETERIZATION N/A 08/25/2016   Procedure: Right Heart Cath;  Surgeon: Jolaine Artist, MD;  Location: [place] [facility];  Service: Cardiovascular;  Laterality: N/A;  . CARDIAC CATHETERIZATION N/A 09/03/2016   Procedure: Right Heart Cath;  Surgeon: Jolaine Artist, MD;  Location: [place] [facility];  Service: Cardiovascular;  Laterality: N/A;  . CARDIAC DEFIBRILLATOR PLACEMENT    . CARDIAC DEFIBRILLATOR PLACEMENT    . CHOLECYSTECTOMY N/A 08/30/2015   Procedure: LAPAROSCOPIC CHOLECYSTECTOMY;  Surgeon: Hubbard Robinson, MD;  Location: [name];  Service: General;  Laterality: N/A;  . COLONOSCOPY WITH PROPOFOL N/A 08/18/2016   Procedure: COLONOSCOPY WITH PROPOFOL;  Surgeon: Jerene Bears, MD;   Location: [place];  Service: Gastroenterology;  Laterality: N/A;  . CORONARY ARTERY BYPASS GRAFT  2001  . ENTEROSCOPY N/A 05/02/2017   Procedure: ENTEROSCOPY;  Surgeon: Mauri Pole, MD;  Location: [facility] ENDOSCOPY;  Service: Endoscopy;  Laterality: N/A;  . INSERT / REPLACE / REMOVE PACEMAKER    . INSERTION OF IMPLANTABLE LEFT VENTRICULAR ASSIST DEVICE N/A 09/09/2016   Procedure: INSERTION OF IMPLANTABLE LEFT VENTRICULAR ASSIST DEVICE;  Surgeon: [name], MD;  Location: [place];  Service: Open Heart Surgery;  Laterality: N/A;  HeartMate 3 CIRC ARREST  NITRIC OXIDE  . IR FLUORO GUIDE CV LINE RIGHT  01/28/2017  . IR GENERIC HISTORICAL  08/03/2016   IR FLUORO GUIDE CV LINE RIGHT 08/03/2016 Eulas Post  Kathlene Cote, MD [facility]  . IR GENERIC HISTORICAL  12/14/2016   IR FLUORO GUIDE CV LINE RIGHT 12/14/2016 [name], MD [facility]  . IR US GUIDE VASC ACCESS RIGHT  01/28/2017  . LUMBAR DISC SURGERY  02/1999   Discectomy and fusion  . RIGHT HEART CATH N/A 02/11/2017   Procedure: Right Heart Cath;  Surgeon: Jolaine Artist, MD;  Location: [place] [facility];  Service: Cardiovascular;  Laterality: N/A;  . TEE WITHOUT CARDIOVERSION N/A 09/09/2016   Procedure: TRANSESOPHAGEAL ECHOCARDIOGRAM (TEE);  Surgeon: [name], MD;  Location: [place];  Service: Open Heart Surgery;  Laterality: N/A;  . VASECTOMY     Subsequent reversal    Family History: Family History  Problem Relation Age of Onset  . CAD Father   . Diabetes Father   . Heart failure Father     Social History: Social History   Social History  . Marital status: Divorced    Spouse name: N/A  . Number of children: 1  . Years of education: 57   Occupational History  . Retired   . Truck driver    Social History Main Topics  . Smoking status: Former Smoker    Packs/day: 1.50    Years: 23.00    Types: Cigarettes    Start date: 10/24/1992    Quit date: 07/01/2016  . Smokeless tobacco: Never Used  . Alcohol use  No  . Drug use: Yes    Frequency: 0.1 times per week    Types: Cocaine     Comment: 07/01/2016 last use relapse after 8 yrs  . Sexual activity: Not Currently   Other Topics Concern  . Not on file   Social History Narrative   Divorced with one child   No regular exercise    Allergies:  Allergies  Allergen Reactions  . Codeine Nausea And Vomiting and Other (See Comments)    In high doses  . Trazodone And Nefazodone Other (See Comments)    Dizziness with subsequent fall   . Lipitor [Atorvastatin] Nausea Only and Other (See Comments)    Nausea with high doses, tolerates 20mg  dose (08/22/16)  . Tape Other (See Comments)    Paper tape is ok    Objective:    Vital Signs:   Doppler Pressure: 78 Automatic BP 66/54 HR 78 SPO2 95  Mean arterial Pressure 60  Physical Exam    General:  Fatigued appearing.  No resp difficulty HEENT: Normal Neck: supple. JVP 7-8 cm. Carotids 2+ bilat; no bruits. No lymphadenopathy or thyromegaly appreciated. Cor: Mechanical heart sounds with LVAD hum present. Lungs: Clear Abdomen: soft, nontender, nondistended. No hepatosplenomegaly. No bruits or masses. Good bowel sounds. Driveline: C/D/I; securement device intact and driveline incorporated Extremities: no cyanosis, clubbing, rash, edema Neuro: alert & orientedx3, cranial nerves grossly intact. moves all 4 extremities w/o difficulty. Affect pleasant  Telemetry   Not yet connected  EKG   N/A  Labs    Basic Metabolic Panel: No results for input(s): NA, K, CL, CO2, GLUCOSE, BUN, CREATININE, CALCIUM, MG, PHOS in the last 168 hours.  Liver Function Tests: No results for input(s): AST, ALT, ALKPHOS, BILITOT, PROT, ALBUMIN in the last 168 hours. No results for input(s): LIPASE, AMYLASE in the last 168 hours. No results for input(s): AMMONIA in the last 168 hours.  CBC:  Recent Labs Lab 05/26/17 1044 05/30/17 1152  WBC 4.1 7.0  HGB 7.6* 8.9*  HCT 23.8* 27.6*  MCV 85.9 87.1  PLT  152 171    Cardiac Enzymes: No results for input(s): CKTOTAL, CKMB, CKMBINDEX, TROPONINI in the last 168 hours.  BNP: BNP (last 3 results)  Recent Labs  10/13/16 1207 11/30/16 1158 01/21/17 1230  BNP 321.5* 565.5* 649.9*    ProBNP (last 3 results) No results for input(s): PROBNP in the last 8760 hours.   CBG:  Recent Labs Lab 05/27/17 0853 05/27/17 1450  GLUCAP 241* 191*    Coagulation Studies:  Recent Labs  05/30/17 1152  LABPROT 21.0*  INR 1.79    Imaging     No results found.   Patient Profile:   Ronald Milleris a 61 y.o.male with history of chronic systolic HF s/p Medtronic ICD 2014, CAD s/p CABG in 2000, HTN, Hx of cocaine abuse, Tobacco abuse, Depression, PAF, and COPD. HM3 placed 09/09/2016.   Admitted for Observation for capsule study via [place].   Assessment/Plan:    1. Chronic end-stage biventricular systolic HF: LVEF 09% due to ICM -> s/p Echo 08/24/16 LVEF 15%, RV mild dilated, moderately reduced. -->S/P HM3 LVAD 98/12/3823.  - Complicated by RV failure. Initially on milrinone but stopped due to low mixed venous saturation.  Now on dobutamine 7.5. Recently titrated due to low co-ox.  - Coox pending.  - Volume status OK on exam.   - Continue torsemide 100 mg q am and 50 mg q pm.  - Has been turned down for [facility] at [facility]. He is now motivated to requalify. Encouraged him to stay motivated.  - Continue sildenafil 40 tid.    - No beta blocker with RV failure - Continue digoxin 0.125 mcg daily.  - VAD parameters reviewed personally - stable - Warfarin with goal INR 2.0 - 2.5 + ASA 81 daily. - INR 1.79. Dosing per Pharmacy.  - LDH 208 - Had epistaxis with lovenox, will avoid this in the future.  2. CAD: No CP. 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  - No s/s of ischemia.  3. DMII:  - Continue evening lantus. + SSI.  4. Tobacco abuse:  - Reports complete cessation. No change.  5. Atrial  fibrillation, paroxysmal - Remains in NSR. Continue daily amio. 6.  Anxiety/Depression - Continue Celexa 20mg  daily  - Living with his sister seems to have helped tremendously - Stressed need to f/u with counselors to help re-qualify for transplant 8. RUE DVT.  - Continue warfarin. No change.  9. Hyponatremia:  - BMET pending. Restrict free water.  10. Anemia, iron-deficiency:  - CBC pending. GI consulted. Plan for capsule endoscopy today.   Pressures soft. With anemia. Will hold evening torsemide.   I reviewed the LVAD parameters from today, and compared the results to the patient's prior recorded data.  No programming changes were made.  The LVAD is functioning within specified parameters.  The patient performs LVAD self-test daily.  LVAD interrogation was negative for any significant power changes, alarms or PI events/speed drops.  LVAD equipment check completed and is in good working order.  Back-up equipment present.   LVAD education done on emergency procedures and precautions and reviewed exit site care.  Length of Stay: 0  Annamaria Helling 05/31/2017, 9:12 AM  VAD Team Pager [phone] (7am - 7am) +++VAD ISSUES ONLY+++   Advanced Heart Failure Team Pager [phone] (M-F; [place])  Please contact [facility] for night-coverage after hours (4p -7a ) and weekends on [email or web address] for all non- LVAD Issues  Patient  seen and examined with the above-signed Advanced Practice Provider and/or Housestaff. I personally reviewed laboratory data, imaging studies and relevant notes. I independently examined the patient and formulated the important aspects of the plan. I have edited the note to reflect any of my changes or salient points. I have personally discussed the plan with the patient and/or family.  Overall stable from HF perspective. VAD parameters ok. He his here for capsule endoscopy to evaluate melena with symptomatic anemia. D/w GI. Will plan proceed tomorrow. Start  heparin with low INR. D/w PharmD.   Glori Bickers, MD  1:55 PM

## 2017-06-01 ENCOUNTER — Encounter (HOSPITAL_COMMUNITY)
Admission: RE | Disposition: A | Discharge status: Home Health | Payer: Self-pay | Source: Ambulatory Visit | Attending: Internal Medicine

## 2017-06-01 DIAGNOSIS — I5082 Biventricular heart failure: Secondary | ICD-10-CM | POA: Diagnosis present

## 2017-06-01 DIAGNOSIS — K922 Gastrointestinal hemorrhage, unspecified: Secondary | ICD-10-CM

## 2017-06-01 DIAGNOSIS — F329 Major depressive disorder, single episode, unspecified: Secondary | ICD-10-CM | POA: Diagnosis present

## 2017-06-01 DIAGNOSIS — Z86718 Personal history of other venous thrombosis and embolism: Secondary | ICD-10-CM | POA: Diagnosis not present

## 2017-06-01 DIAGNOSIS — K2960 Other gastritis without bleeding: Secondary | ICD-10-CM | POA: Diagnosis present

## 2017-06-01 DIAGNOSIS — Z95811 Presence of heart assist device: Secondary | ICD-10-CM | POA: Diagnosis not present

## 2017-06-01 DIAGNOSIS — R791 Abnormal coagulation profile: Secondary | ICD-10-CM | POA: Diagnosis not present

## 2017-06-01 DIAGNOSIS — K921 Melena: Secondary | ICD-10-CM | POA: Diagnosis present

## 2017-06-01 DIAGNOSIS — Z87891 Personal history of nicotine dependence: Secondary | ICD-10-CM | POA: Diagnosis not present

## 2017-06-01 DIAGNOSIS — Z833 Family history of diabetes mellitus: Secondary | ICD-10-CM | POA: Diagnosis not present

## 2017-06-01 DIAGNOSIS — R195 Other fecal abnormalities: Secondary | ICD-10-CM

## 2017-06-01 DIAGNOSIS — D649 Anemia, unspecified: Secondary | ICD-10-CM

## 2017-06-01 DIAGNOSIS — I110 Hypertensive heart disease with heart failure: Secondary | ICD-10-CM | POA: Diagnosis present

## 2017-06-01 DIAGNOSIS — Z8249 Family history of ischemic heart disease and other diseases of the circulatory system: Secondary | ICD-10-CM | POA: Diagnosis not present

## 2017-06-01 DIAGNOSIS — E871 Hypo-osmolality and hyponatremia: Secondary | ICD-10-CM | POA: Diagnosis present

## 2017-06-01 DIAGNOSIS — D509 Iron deficiency anemia, unspecified: Secondary | ICD-10-CM | POA: Diagnosis present

## 2017-06-01 DIAGNOSIS — Z7901 Long term (current) use of anticoagulants: Secondary | ICD-10-CM | POA: Diagnosis not present

## 2017-06-01 DIAGNOSIS — Z951 Presence of aortocoronary bypass graft: Secondary | ICD-10-CM | POA: Diagnosis not present

## 2017-06-01 DIAGNOSIS — I5084 End stage heart failure: Secondary | ICD-10-CM | POA: Diagnosis present

## 2017-06-01 DIAGNOSIS — I5022 Chronic systolic (congestive) heart failure: Secondary | ICD-10-CM | POA: Diagnosis not present

## 2017-06-01 DIAGNOSIS — F419 Anxiety disorder, unspecified: Secondary | ICD-10-CM | POA: Diagnosis present

## 2017-06-01 DIAGNOSIS — I2510 Atherosclerotic heart disease of native coronary artery without angina pectoris: Secondary | ICD-10-CM | POA: Diagnosis present

## 2017-06-01 DIAGNOSIS — D500 Iron deficiency anemia secondary to blood loss (chronic): Secondary | ICD-10-CM | POA: Diagnosis not present

## 2017-06-01 DIAGNOSIS — E119 Type 2 diabetes mellitus without complications: Secondary | ICD-10-CM | POA: Diagnosis present

## 2017-06-01 DIAGNOSIS — Z794 Long term (current) use of insulin: Secondary | ICD-10-CM | POA: Diagnosis not present

## 2017-06-01 DIAGNOSIS — I480 Paroxysmal atrial fibrillation: Secondary | ICD-10-CM | POA: Diagnosis present

## 2017-06-01 DIAGNOSIS — J449 Chronic obstructive pulmonary disease, unspecified: Secondary | ICD-10-CM | POA: Diagnosis present

## 2017-06-01 DIAGNOSIS — D62 Acute posthemorrhagic anemia: Secondary | ICD-10-CM | POA: Diagnosis present

## 2017-06-01 HISTORY — PX: GIVENS CAPSULE STUDY: SHX5432

## 2017-06-01 LAB — CBC
HCT: 26.5 % — ABNORMAL LOW (ref 39.0–52.0)
Hemoglobin: 8.2 g/dL — ABNORMAL LOW (ref 13.0–17.0)
MCH: 27 pg (ref 26.0–34.0)
MCHC: 30.9 g/dL (ref 30.0–36.0)
MCV: 87.2 fL (ref 78.0–100.0)
PLATELETS: 195 10*3/uL (ref 150–400)
RBC: 3.04 MIL/uL — AB (ref 4.22–5.81)
RDW: 20 % — ABNORMAL HIGH (ref 11.5–15.5)
WBC: 6.7 10*3/uL (ref 4.0–10.5)

## 2017-06-01 LAB — COOXEMETRY PANEL
Carboxyhemoglobin: 2.3 % — ABNORMAL HIGH (ref 0.5–1.5)
Methemoglobin: 0.8 % (ref 0.0–1.5)
O2 Saturation: 56.6 %
Total hemoglobin: 8.5 g/dL — ABNORMAL LOW (ref 12.0–16.0)

## 2017-06-01 LAB — BASIC METABOLIC PANEL
ANION GAP: 11 (ref 5–15)
BUN: 29 mg/dL — ABNORMAL HIGH (ref 6–20)
CO2: 31 mmol/L (ref 22–32)
Calcium: 8.6 mg/dL — ABNORMAL LOW (ref 8.9–10.3)
Chloride: 94 mmol/L — ABNORMAL LOW (ref 101–111)
Creatinine, Ser: 1.31 mg/dL — ABNORMAL HIGH (ref 0.61–1.24)
GFR calc Af Amer: >60 mL/min (ref >60)
GFR, EST NON AFRICAN AMERICAN: 58 mL/min — AB (ref >60)
GLUCOSE: 92 mg/dL (ref 65–99)
POTASSIUM: 3.8 mmol/L (ref 3.5–5.1)
SODIUM: 136 mmol/L (ref 135–145)

## 2017-06-01 LAB — HEPARIN LEVEL (UNFRACTIONATED)
HEPARIN UNFRACTIONATED: 0.41 [IU]/mL (ref 0.30–0.70)
Heparin Unfractionated: 0.46 IU/mL (ref 0.30–0.70)

## 2017-06-01 LAB — MRSA PCR SCREENING: MRSA by PCR: NEGATIVE

## 2017-06-01 LAB — PROTIME-INR
INR: 1.83
Prothrombin Time: 21 seconds — ABNORMAL HIGH (ref 11.4–15.2)

## 2017-06-01 LAB — GLUCOSE, CAPILLARY
GLUCOSE-CAPILLARY: 108 mg/dL — AB (ref 65–99)
GLUCOSE-CAPILLARY: 222 mg/dL — AB (ref 65–99)
GLUCOSE-CAPILLARY: 186 mg/dL — AB (ref 65–99)
Glucose-Capillary: 131 mg/dL — ABNORMAL HIGH (ref 65–99)

## 2017-06-01 LAB — LACTATE DEHYDROGENASE: LDH: 163 U/L (ref 98–192)

## 2017-06-01 IMAGING — CR DG CHEST 2V
1 series · 2 of 2 positions shown · non-contrast
Comparison: Chest radiograph October 27, 2015 and chest CT October 29, 2015

CLINICAL DATA: Shortness of Breath

EXAM:
CHEST  2 VIEW

[Series 1: w chest pa · 0.14mm/px · 2 of 2 slices shown]
[im 1/2]
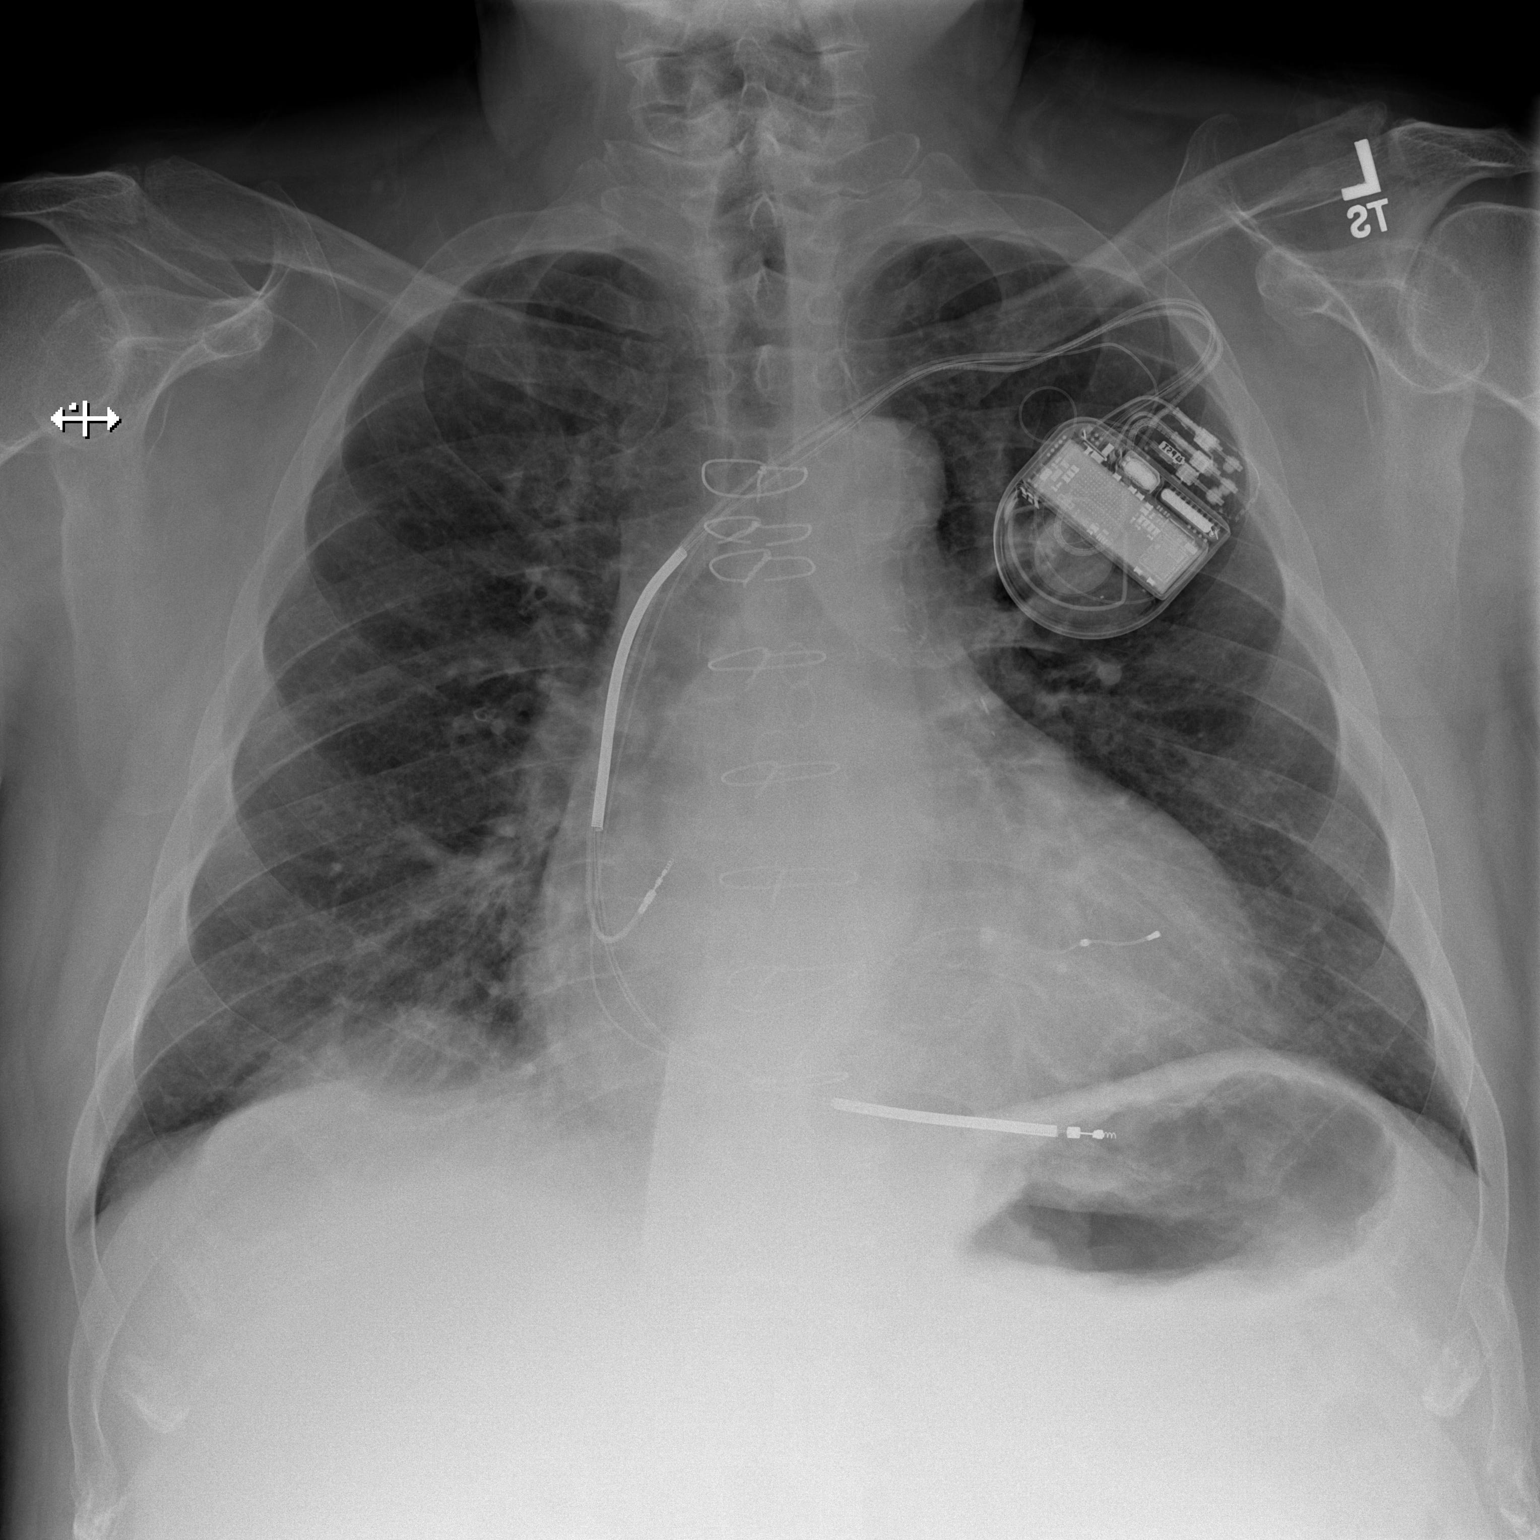
[im 2/2]
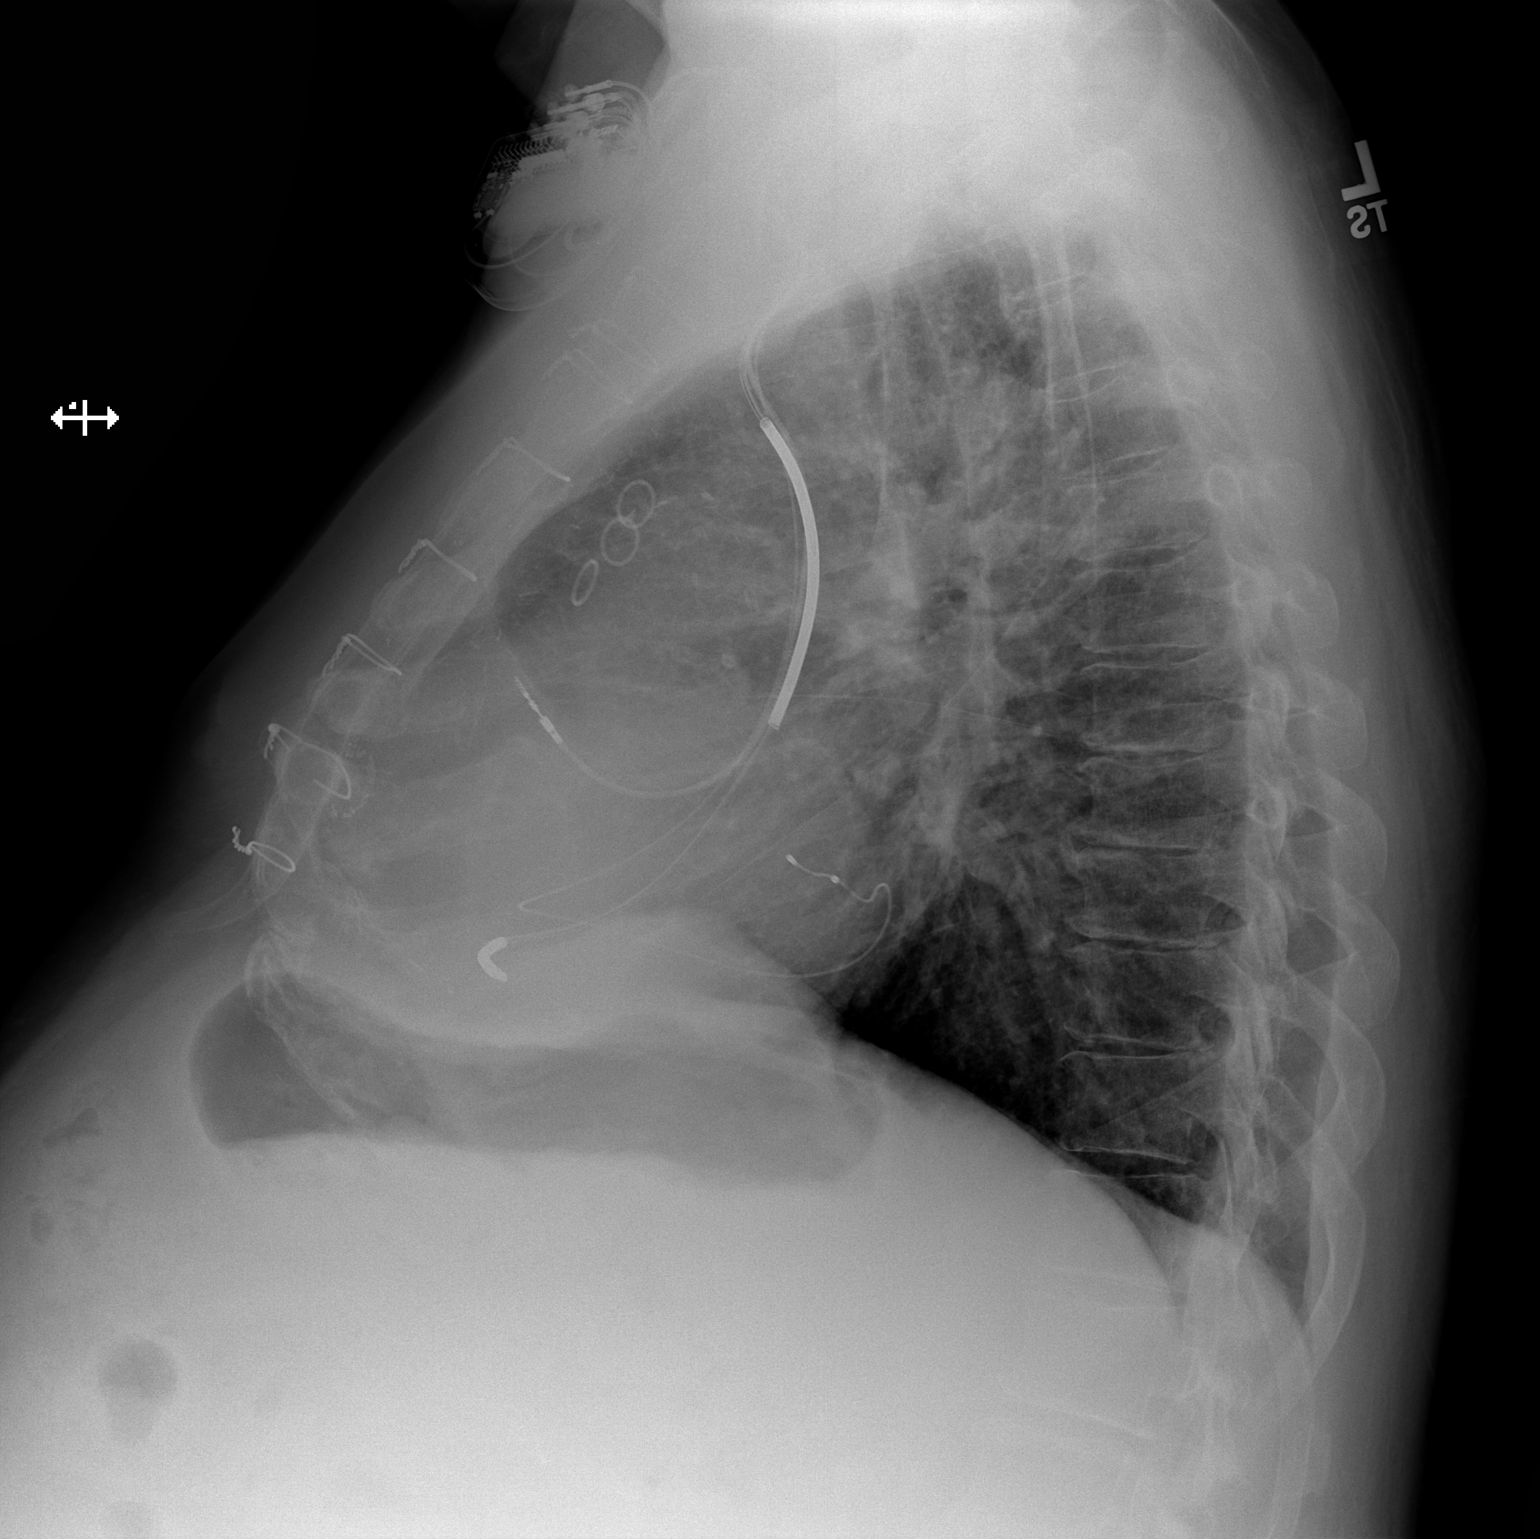

[2 of 2 positions shown; findings below may reference images not displayed]

FINDINGS: There is no appreciable edema or consolidation. L heart is slightly
enlarged with pulmonary vascularity within normal limits. Patient is
status post coronary artery bypass grafting. Pacemaker leads are
attached to the right atrium, right ventricle, and left ventricle.
No adenopathy evident. There is mild degenerative change in the
thoracic spine.
IMPRESSION: Mild cardiac enlargement.  No edema or consolidation.

## 2017-06-01 SURGERY — IMAGING PROCEDURE, GI TRACT, INTRALUMINAL, VIA CAPSULE

## 2017-06-01 MED ORDER — WARFARIN SODIUM 10 MG PO TABS
10.0000 mg | ORAL_TABLET | Freq: Once | ORAL | Status: AC
  Administered 2017-06-01: 10 mg via ORAL
  Filled 2017-06-01: qty 1

## 2017-06-01 MED ORDER — INSULIN GLARGINE 100 UNIT/ML ~~LOC~~ SOLN
10.0000 [IU] | Freq: Every day | SUBCUTANEOUS | Status: DC
  Administered 2017-06-01 – 2017-06-02 (×2): 10 [IU] via SUBCUTANEOUS
  Filled 2017-06-01 (×3): qty 0.1

## 2017-06-01 MED ORDER — WARFARIN - PHARMACIST DOSING INPATIENT: Freq: Every day | Status: DC

## 2017-06-01 SURGICAL SUPPLY — 1 items: TOWEL COTTON PACK 4EA (MISCELLANEOUS) ×6 IMPLANT

## 2017-06-01 NOTE — Progress Notes (Signed)
Advanced Heart Failure VAD Team Note  Subjective:    Administered Capsule Endoscopy this am. Plan to attempt read ~ 1400. If + will need double balloon EGD at [facility] (Only performed on Tuesdays)  Feeling tired. Still having black BMs overnight. Lightheadedness improved.   Hgb 8.2.  LVAD INTERROGATION:  HeartMate II LVAD:  Flow 5.6 liters/min, speed 5900, power 5.0, PI 3.2.  No PI events.   Objective:    Vital Signs:   Temp:  [97.4 F (36.3 C)-98 F (36.7 C)] 97.8 F (36.6 C) (08/29 0745) Pulse Rate:  [46-91] 73 (08/29 0532) Resp:  [17-21] 21 (08/29 0745) BP: (66-118)/(54-83) 85/68 (08/29 0745) SpO2:  [91 %-98 %] 91 % (08/29 0532) Weight:  [211 lb 13.8 oz (96.1 kg)-220 lb 10.9 oz (100.1 kg)] 211 lb 13.8 oz (96.1 kg) (08/29 0754) Last BM Date: 05/31/17 Mean arterial Pressure 70-80s  Intake/Output:   Intake/Output Summary (Last 24 hours) at 06/01/17 0848 Last data filed at 06/01/17 0700  Gross per 24 hour  Intake          1113.77 ml  Output                0 ml  Net          1113.77 ml     Physical Exam    General:  Fatigued appearing. No resp difficulty HEENT: normal Neck: supple. JVP 7-8 cm. Carotids 2+ bilat; no bruits. No lymphadenopathy or thyromegaly appreciated. Cor: Mechanical heart sounds with LVAD hum present. Lungs: clear Abdomen: soft, nontender, nondistended. No hepatosplenomegaly. No bruits or masses. Good bowel sounds. Driveline: C/D/I; securement device intact and driveline incorporated Extremities: no cyanosis, clubbing, rash, edema Neuro: alert & orientedx3, cranial nerves grossly intact. moves all 4 extremities w/o difficulty. Affect pleasant   Telemetry   Personally reviewed, NSR  EKG    N/A  Labs   Basic Metabolic Panel:  Recent Labs Lab 05/31/17 1034  NA 132*  K 4.8  CL 92*  CO2 30  GLUCOSE 204*  BUN 40*  CREATININE 1.66*  CALCIUM 9.1    Liver Function Tests:  Recent Labs Lab 05/31/17 1034  AST 23  ALT 26  ALKPHOS  77  BILITOT 0.6  PROT 7.1  ALBUMIN 3.5   No results for input(s): LIPASE, AMYLASE in the last 168 hours. No results for input(s): AMMONIA in the last 168 hours.  CBC:  Recent Labs Lab 05/26/17 1044 05/30/17 1152 05/31/17 1034 06/01/17 0435  WBC 4.1 7.0 8.1 6.7  NEUTROABS  --   --  6.8  --   HGB 7.6* 8.9* 8.8* 8.2*  HCT 23.8* 27.6* 28.1* 26.5*  MCV 85.9 87.1 87.5 87.2  PLT 152 171 186 195    INR:  Recent Labs Lab 05/26/17 1044 05/30/17 1152 05/31/17 1034 06/01/17 0435  INR 1.81 1.79 1.75 1.83    Other results:  EKG:    Imaging    No results found.   Medications:     Scheduled Medications: . amiodarone  200 mg Oral Daily  . citalopram  20 mg Oral Daily  . clonazePAM  0.25 mg Oral BID  . digoxin  0.125 mg Oral Daily  . docusate sodium  200 mg Oral QHS  . gabapentin  200 mg Oral BID  . insulin aspart  0-15 Units Subcutaneous TID WC  . insulin aspart  5 Units Subcutaneous TID WC  . insulin glargine  20 Units Subcutaneous Q2200  . losartan  25 mg Oral Daily  .  magnesium oxide  400 mg Oral Daily  . multivitamin with minerals  1 tablet Oral Daily  . pantoprazole  40 mg Oral BID AC  . potassium chloride SA  40 mEq Oral BID  . sildenafil  40 mg Oral TID  . spironolactone  25 mg Oral Daily  . sucralfate  1 g Oral TID WC & HS  . torsemide  100 mg Oral Daily  . torsemide  50 mg Oral Daily     Infusions: . DOBUTamine 6 mcg/kg/min (05/31/17 1013)  . heparin 1,900 Units/hr (06/01/17 0252)     PRN Medications:  acetaminophen, ondansetron (ZOFRAN) IV, ondansetron, senna-docusate, traMADol, zolpidem   Patient Profile   Ronald Miller Jr.is a 61 y.o.male with history of chronic systolic HF s/p Medtronic ICD 2014, CAD s/p CABG in 2000, HTN, Hx of cocaine abuse, Tobacco abuse, Depression, PAF, and COPD. HM3 placed 09/09/2016.   Admitted for Observation for capsule study via [place].   Assessment/Plan:    1. Chronic end-stage biventricular  systolic OA:CZYS 06% due to ICM ->s/p Echo 08/24/16 LVEF 15%, RV mild dilated, moderately reduced. -->S/P HM3 LVAD 30/10/6008.  - Complicated by RV failure. Initially on milrinone but stopped due to low mixed venous saturation. Now on dobutamine 7.5. Recently titrated due to low co-ox.  - Coox pending. PICC would not draw back yesterday.  - Volume status OK.  - Continue torsemide 100 mg q am and 50 mg q pm.  - Has been turned down for transplant at [facility]. He is now motivated to requalify. Encouraged him to stay motivated.  - Continue sildenafil 40 tid.  - No beta blocker with RV failure - Continue digoxin 0.125 mcg daily.  - VAD parameters reviewed personally - stable - Warfarin with goal INR 2.0 - 2.5 + ASA 81 daily. - INR 1.83. Dosing per pharmacy.   - LDH 208 -> 187.  - Had epistaxis with lovenox, will avoid this in the future.  2. CAD: Severe 3v- CAD s/p CABG with occluded grafts as above except for LIMA.  - No CP. No s/s of ischemia.   3. DMII:  - Continue evening lantus. + SSI. No change.  4. Tobacco abuse:  - Reports complete cessation. No change.  5. Atrial fibrillation, paroxysmal - Remains in NSR. Continue daily amio. 6. Anxiety/Depression - Continue Celexa 20mg  daily  - Living with his sister seems to have helped tremendously - Stressed need to f/u with counselors to help re-qualify for transplant. - No change.  8. RUE DVT.  - Continue warfarin. No change.   9. Hyponatremia:  - Na 132.  - Restrict free water.   10. Anemia, iron-deficiency:  - Hgb 8.2 - Capsule endoscopy administered this am. Equipment to be worn until 1555.  Awaiting capsule endo.   I reviewed the LVAD parameters from today, and compared the results to the patient's prior recorded data.  No programming changes were made.  The LVAD is functioning within specified parameters.  The patient performs LVAD self-test daily.  LVAD interrogation was negative for any significant power changes, alarms or PI  events/speed drops.  LVAD equipment check completed and is in good working order.  Back-up equipment present.   LVAD education done on emergency procedures and precautions and reviewed exit site care.  Length of Stay: 1  Annamaria Helling 06/01/2017, 8:48 AM  VAD Team --- VAD ISSUES ONLY--- Pager [phone] (7am - 7am)  Advanced Heart Failure Team  Pager [phone] (M-F; 7a -  4p)  Please contact [facility] for night-coverage after hours (4p -7a ) and weekends on [email or web address]   Patient seen and examined with the above-signed Advanced Practice Provider and/or Housestaff. I personally reviewed laboratory data, imaging studies and relevant notes. I independently examined the patient and formulated the important aspects of the plan. I have edited the note to reflect any of my changes or salient points. I have personally discussed the plan with the patient and/or family.  On heparin and coumadin. Still with melena. Capsule performed. Very fatigued. INR 1.8. Volume status ok. VAD parameters stable.   Will keep in house today. Can stop heparin with INR 1.85. D/w PharmD. Await results of capsule. May need another unit RBCs prior to d/c.   Glori Bickers, MD  9:24 AM

## 2017-06-01 NOTE — Progress Notes (Signed)
ANTICOAGULATION CONSULT NOTE - Follow Up Consult  Pharmacy Consult for Heparin>warfarin Indication: LVAD  Allergies  Allergen Reactions  . Codeine Nausea And Vomiting and Other (See Comments)    In high doses  . Trazodone And Nefazodone Other (See Comments)    Dizziness with subsequent fall   . Lipitor [Atorvastatin] Nausea Only and Other (See Comments)    Nausea with high doses, tolerates 20mg  dose (08/22/16)  . Tape Other (See Comments)    Paper tape is ok    Patient Measurements: Height: 5\' 9"  (175.3 cm) Weight: 211 lb 13.8 oz (96.1 kg) IBW/kg (Calculated) : 70.7 Heparin Dosing Weight: 92kg  Vital Signs: Temp: 97.8 F (36.6 C) (08/29 1614) Temp Source: Oral (08/29 1614) BP: 72/59 (08/29 1614) Pulse Rate: 73 (08/29 0532)  Labs:  Recent Labs  05/30/17 1152 05/31/17 1034 05/31/17 2017 06/01/17 0435 06/01/17 0620  HGB 8.9* 8.8*  --  8.2*  --   HCT 27.6* 28.1*  --  26.5*  --   PLT 171 186  --  195  --   LABPROT 21.0* 20.3*  --  21.0*  --   INR 1.79 1.75  --  1.83  --   HEPARINUNFRC  --   --  0.14* 0.41 0.46  CREATININE  --  1.66*  --  1.31*  --     Estimated Creatinine Clearance: 68.6 mL/min (A) (by C-G formula based on SCr of 1.31 mg/dL (H)).   Medical History: Past Medical History:  Diagnosis Date  . AICD (automatic cardioverter/defibrillator) present   . ASCVD (arteriosclerotic cardiovascular disease)   . Chronic systolic CHF (congestive heart failure) ([place])   . COPD (chronic obstructive pulmonary disease) ([place])   . Coronary artery disease   . Depression   . Diabetes mellitus   . History of cocaine abuse   . Hypertension   . Presence of permanent cardiac pacemaker   . Shortness of breath dyspnea   . Suicidal ideation   . Tobacco abuse    Assessment: 61yom on coumadin pta for his LVAD, being admitted for a capsule endoscopy to evaluate melena with symptomatic anemia. INR 1.79 yesterday in clinic and he took 15mg  last night. INR remained  subtherapeutic on admit  at 1.75. Plan was to hold coumadin and start heparin.  INR bump 1.8 - heparin held > capsule endoscopy completed today - results tomorrow per GI. Continues to have loose dark stools. CBC low stable  LDH stable 160s.  PTA coumadin dose: 10mg  daily except 5mg  on Tuesday - last dose 8/27  Goal of Therapy:  Heparin level 0.3-0.5 units/ml  INR goal 2-2.5 Monitor platelets by anticoagulation protocol: Yes   Plan:  Warfarin 10mg  x1 today Daily INR  Bonnita Nasuti Pharm.D. CPP, BCPS Clinical Pharmacist [phone] 06/01/2017 5:32 PM

## 2017-06-01 NOTE — Care Management Note (Signed)
Case Management Note  Patient Details  Name: Ronald Miller. MRN: [id number] Date of Birth: 1955/12/04  Pt admitted for capsule study - pt is a high risk for bleed due to LVAD               Action/Plan:   PTA independent from home - staying with sister currently.  [facility] aware of admit and will resume IV dobutamine and HHRN at discharge.  CM will continue to follow for discharge needs   Expected Discharge Date:                  Expected Discharge Plan:  [place]  In-House Referral:     Discharge planning Services  CM Consult  Post Acute Care Choice:    Choice offered to:  Patient  DME Arranged:  IV pump/equipment DME Agency:  [place]:  RN [facility] Agency:  [place]  Status of Service:  In process, will continue to follow  If discussed at Long Length of Stay Meetings, dates discussed:    Additional Comments:  Maryclare Labrador, RN 06/01/2017, 4:28 PM

## 2017-06-01 NOTE — Progress Notes (Signed)
Pt given capsule for small bowel study 06/01/2017 at 0755. Swallowed capsule without difficulties. Instruction sheet given to patient and patient's nurse Archie Patten. Pt instructed to wear all equipment until 1555 today.

## 2017-06-01 NOTE — Progress Notes (Addendum)
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 Prescott Gum.   Recent admit on 7/31> enteroscopy positive for stomach ulcers. Carafate started.   Vital signs: HR: 78vpaced Doppler Pressure: 78 Automatic BP: unable to obtain O2 Sat: 95RA Wt: 211>211  LVAD interrogation reveals:  Speed:5900 Flow: 5.6 Power: 4.7 PI: 3.2 Alarms: none Events: none Fixed speed: 5900 Low speed limit: 5600 HCT: 26  Drive Line: weekly. Due on Mondays.  Labs:  LDH trend: 208>163  INR trend: 1.75>1.83  Anticoagulation Plan: -INR Goal: 2-2.5 -ASA Dose: 81 mg  Gtts: Dobutamine 6 mcg   Plan/Recommendations:   1. Plan for capsule study today per GI. Possible discharge later. 2. Please page VAD pager with any concerns  Balinda Quails RN, [place] Coordinator 24/7 pager [phone]

## 2017-06-01 NOTE — Progress Notes (Signed)
Inpatient Diabetes Program Recommendations  AACE/ADA: New Consensus Statement on Inpatient Glycemic Control (2015)  Target Ranges:  Prepandial:   less than 140 mg/dL      Peak postprandial:   less than 180 mg/dL (1-2 hours)      Critically ill patients:  140 - 180 mg/dL   Lab Results  Component Value Date   GLUCAP 108 (H) 06/01/2017   HGBA1C 7.4 (H) 04/12/2017    Review of Glycemic Control  Diabetes history: DM2 Outpatient Diabetes medications: Lantus 10 units + Novolog 5 units tid meal coverage Current orders for Inpatient glycemic control: Lantus 20 units + Novolog 5 units tid + Novolog correction 0-15 units tid  Inpatient Diabetes Program Recommendations:    Patient states only takes Lantus 10 units daily. Please consider: -Decrease Lantus to 10 units  Thank you, Bethena Roys E. Rayvn Rickerson, RN, MSN, CDE  Diabetes Coordinator Inpatient Glycemic Control Team Team Pager [phone] (8am-5pm) 06/01/2017 10:38 AM

## 2017-06-01 NOTE — Progress Notes (Signed)
Capsule study in progress.  Feels tired.  Less light headed.  Stools still watery and black.    Did speak with pt, not reexamined.  Looks chronically ill.   Scheduled Meds: . amiodarone  200 mg Oral Daily  . citalopram  20 mg Oral Daily  . digoxin  0.125 mg Oral Daily  . docusate sodium  200 mg Oral QHS  . gabapentin  200 mg Oral BID  . insulin aspart  0-15 Units Subcutaneous TID WC  . insulin aspart  5 Units Subcutaneous TID WC  . insulin glargine  10 Units Subcutaneous Q2200  . losartan  25 mg Oral Daily  . magnesium oxide  400 mg Oral Daily  . multivitamin with minerals  1 tablet Oral Daily  . pantoprazole  40 mg Oral BID AC  . potassium chloride SA  40 mEq Oral BID  . sildenafil  40 mg Oral TID  . spironolactone  25 mg Oral Daily  . sucralfate  1 g Oral TID WC & HS  . torsemide  100 mg Oral Daily  . torsemide  50 mg Oral Daily   Continuous Infusions: . DOBUTamine 6 mcg/kg/min (05/31/17 1013)   Labs Hgb 8.8 >> 8.2.    A/P  *  Melena, progressive anemia 04/2012 Enteroscopy:  Gastritis, friable with small ulcerations, no H pylori.  On BID Protonix.   08/2016 Colonoscoy: hemorrhoids and a few tics.  Capsule endoscopy in progress, dropped this AM Plan to read study tomorrow.   *  End stage CHF.  S/p LVAD.  Home dobutamine. Chronic Coumadin on hold    Azucena Freed PA-C [phone].

## 2017-06-02 DIAGNOSIS — R195 Other fecal abnormalities: Secondary | ICD-10-CM

## 2017-06-02 DIAGNOSIS — D649 Anemia, unspecified: Secondary | ICD-10-CM

## 2017-06-02 LAB — GLUCOSE, CAPILLARY
Glucose-Capillary: 126 mg/dL — ABNORMAL HIGH (ref 65–99)
Glucose-Capillary: 173 mg/dL — ABNORMAL HIGH (ref 65–99)
Glucose-Capillary: 133 mg/dL — ABNORMAL HIGH (ref 65–99)
Glucose-Capillary: 155 mg/dL — ABNORMAL HIGH (ref 65–99)

## 2017-06-02 LAB — CBC
HEMATOCRIT: 27.9 % — AB (ref 39.0–52.0)
HEMOGLOBIN: 8.7 g/dL — AB (ref 13.0–17.0)
MCH: 27.3 pg (ref 26.0–34.0)
MCHC: 31.2 g/dL (ref 30.0–36.0)
MCV: 87.5 fL (ref 78.0–100.0)
Platelets: 220 10*3/uL (ref 150–400)
RBC: 3.19 MIL/uL — AB (ref 4.22–5.81)
RDW: 20.1 % — ABNORMAL HIGH (ref 11.5–15.5)
WBC: 7.2 10*3/uL (ref 4.0–10.5)

## 2017-06-02 LAB — BASIC METABOLIC PANEL
Anion gap: 7 (ref 5–15)
BUN: 22 mg/dL — ABNORMAL HIGH (ref 6–20)
CO2: 28 mmol/L (ref 22–32)
Calcium: 8.3 mg/dL — ABNORMAL LOW (ref 8.9–10.3)
Chloride: 98 mmol/L — ABNORMAL LOW (ref 101–111)
Creatinine, Ser: 1.23 mg/dL (ref 0.61–1.24)
GFR calc Af Amer: >60 mL/min (ref >60)
GFR calc non Af Amer: >60 mL/min (ref >60)
Glucose, Bld: 111 mg/dL — ABNORMAL HIGH (ref 65–99)
Potassium: 4 mmol/L (ref 3.5–5.1)
Sodium: 133 mmol/L — ABNORMAL LOW (ref 135–145)

## 2017-06-02 LAB — PROTIME-INR
INR: 1.67
Prothrombin Time: 19.5 seconds — ABNORMAL HIGH (ref 11.4–15.2)

## 2017-06-02 LAB — LACTATE DEHYDROGENASE: LDH: 168 U/L (ref 98–192)

## 2017-06-02 LAB — HEPARIN LEVEL (UNFRACTIONATED): Heparin Unfractionated: 0.41 IU/mL (ref 0.30–0.70)

## 2017-06-02 IMAGING — US US ABDOMEN LIMITED
1 series · 14 of 16 positions shown · non-contrast
Comparison: 10/30/2015

CLINICAL DATA: 60-year-old with generalized abdominal pain.
Abdominal distension. Evaluate for lower quadrant ascites.

EXAM:
LIMITED ABDOMEN ULTRASOUND FOR ASCITES
TECHNIQUE: Limited ultrasound survey for ascites was performed in all four
abdominal quadrants.

[Series 1: us abdomen limited · 0.33mm/px · 14 of 16 slices shown]
[im 1/16]
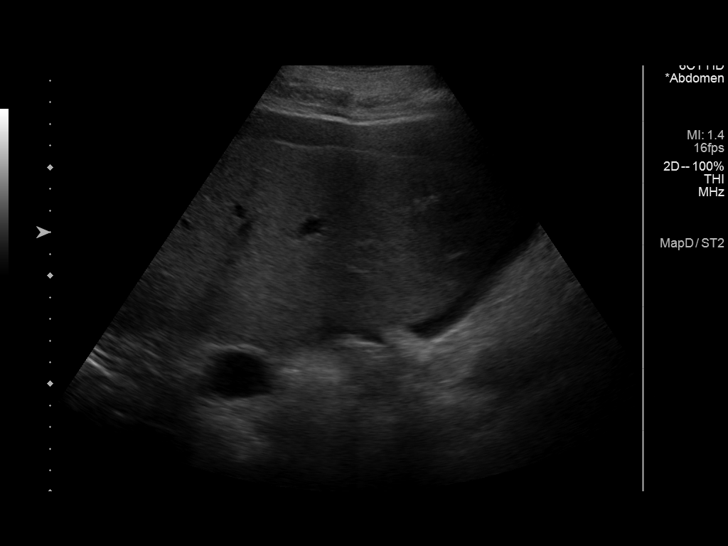
[im 2/16]
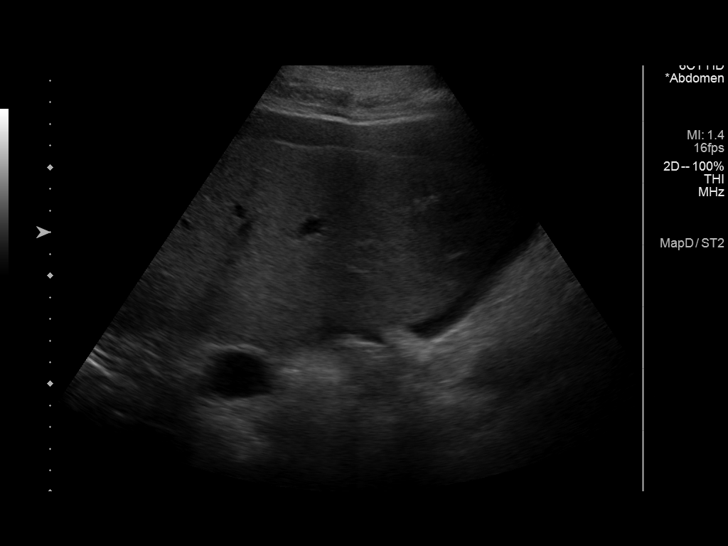
[im 3/16]
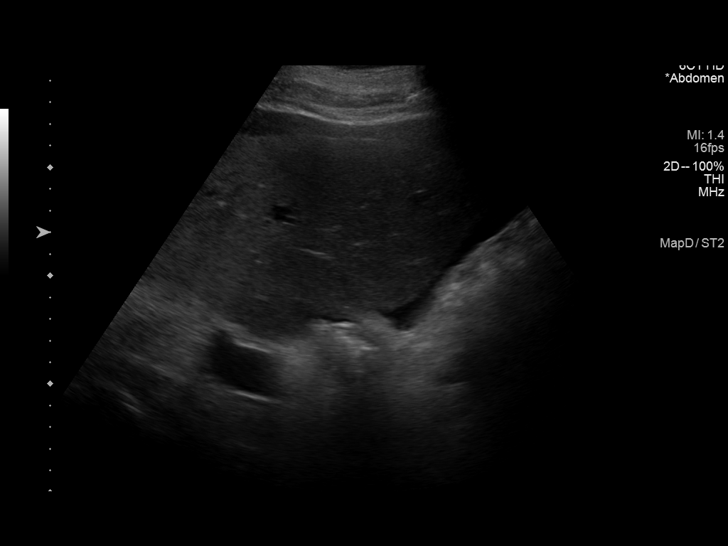
[im 5/16]
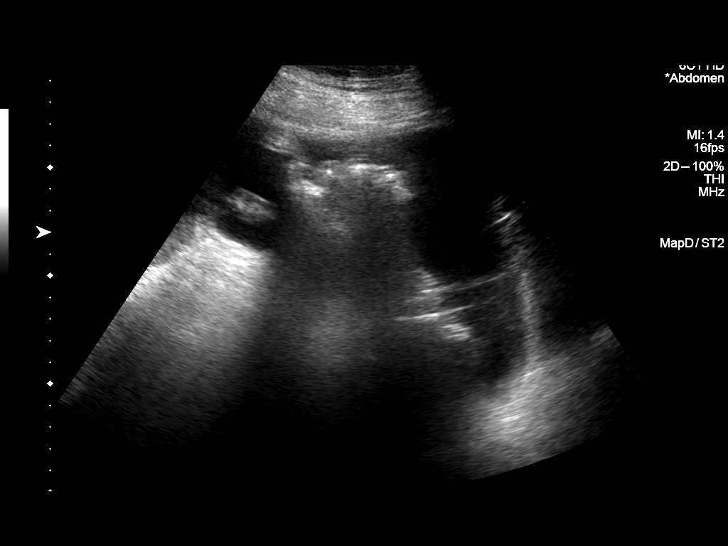
[im 6/16]
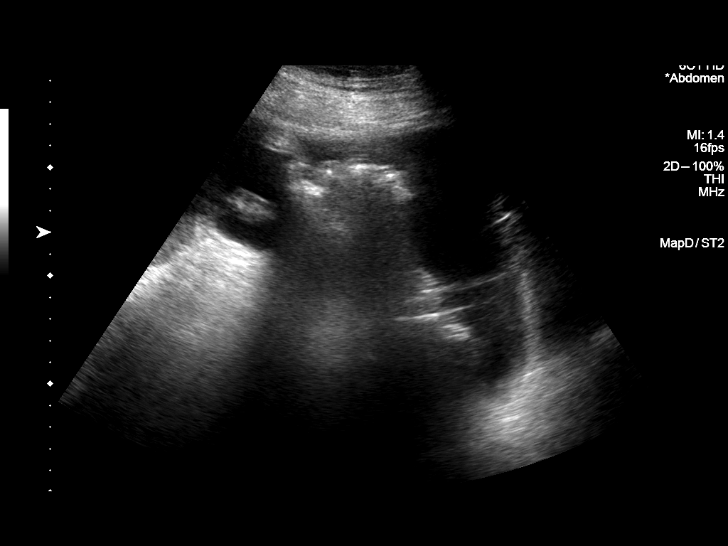
[im 7/16]
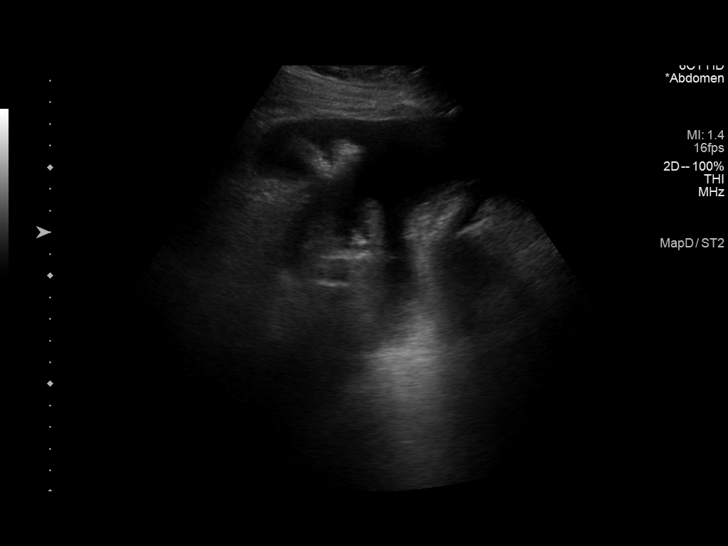
[im 8/16]
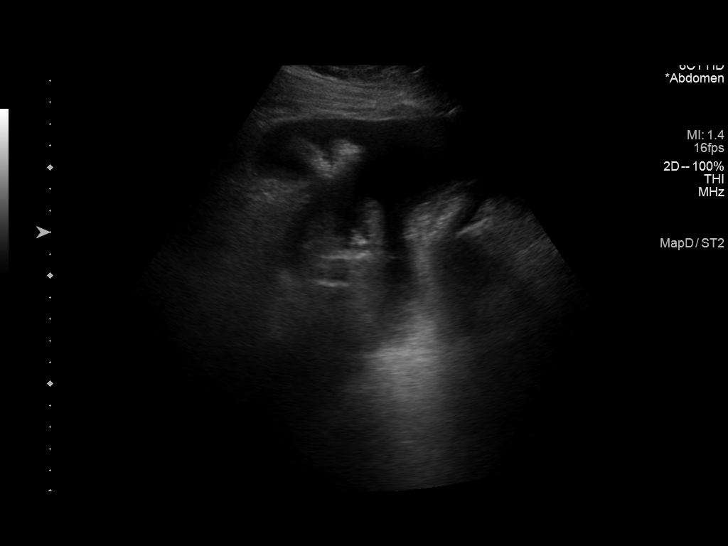
[im 9/16]
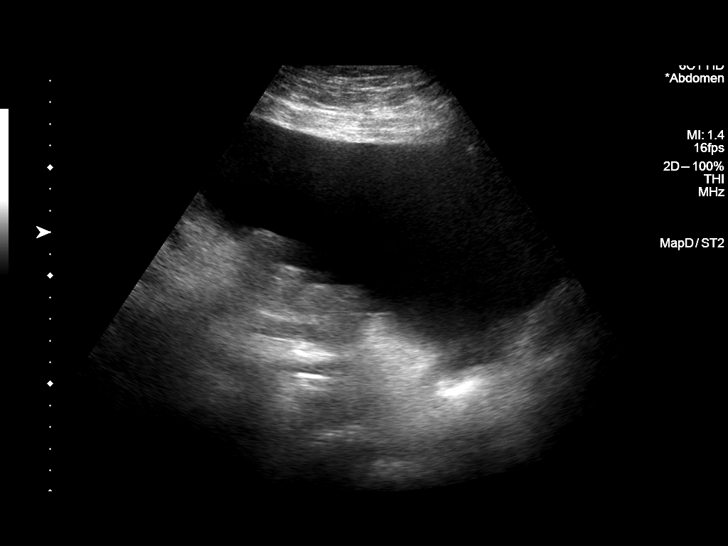
[im 10/16]
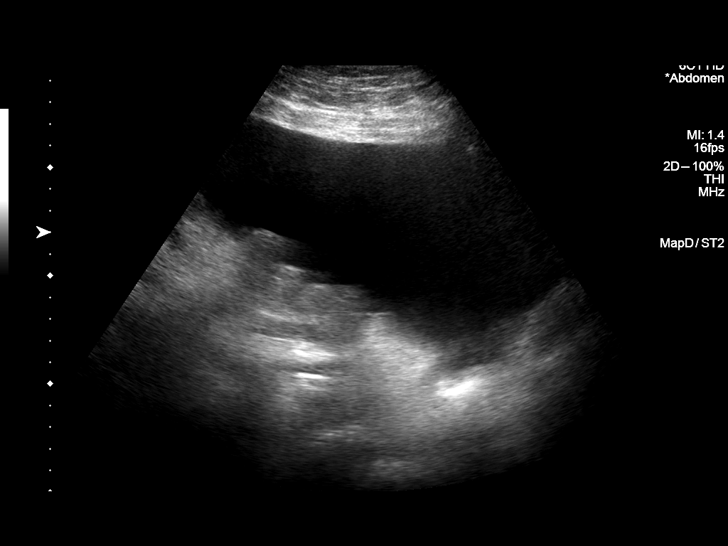
[im 11/16]
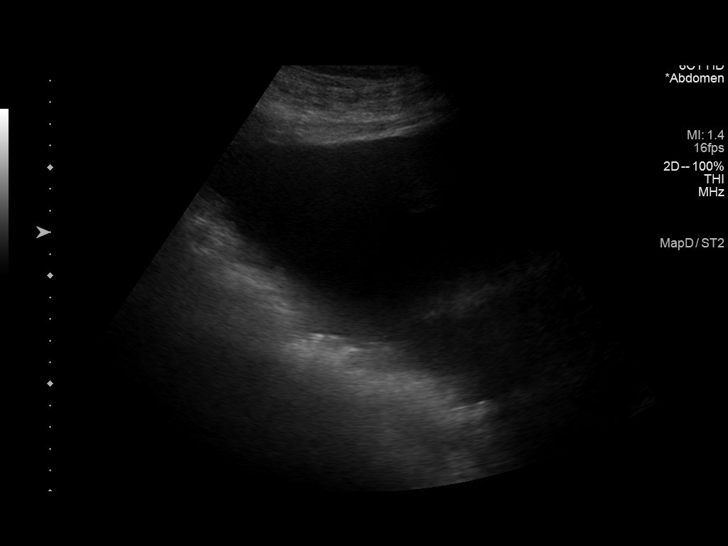
[im 13/16]
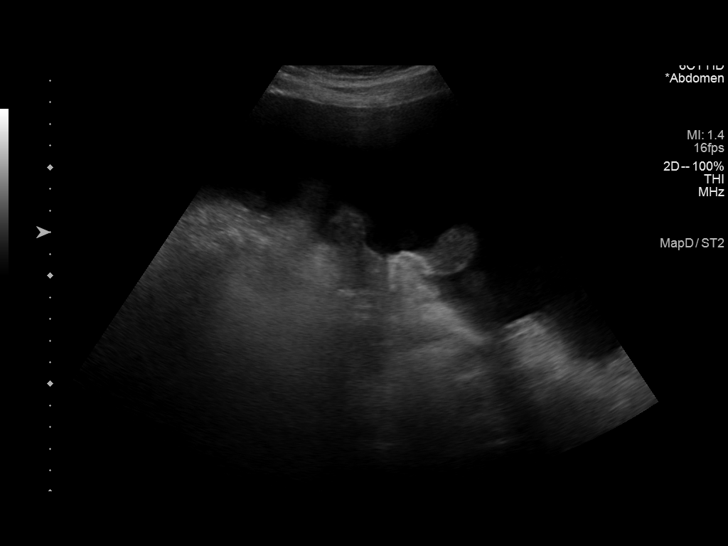
[im 14/16]
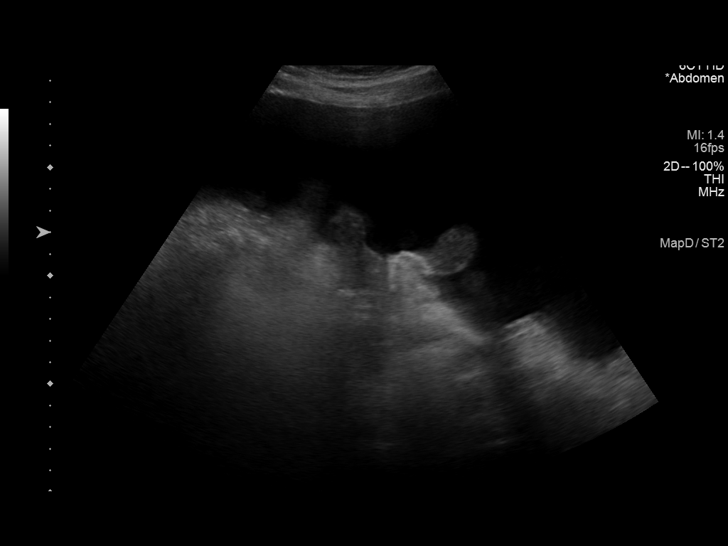
[im 15/16]
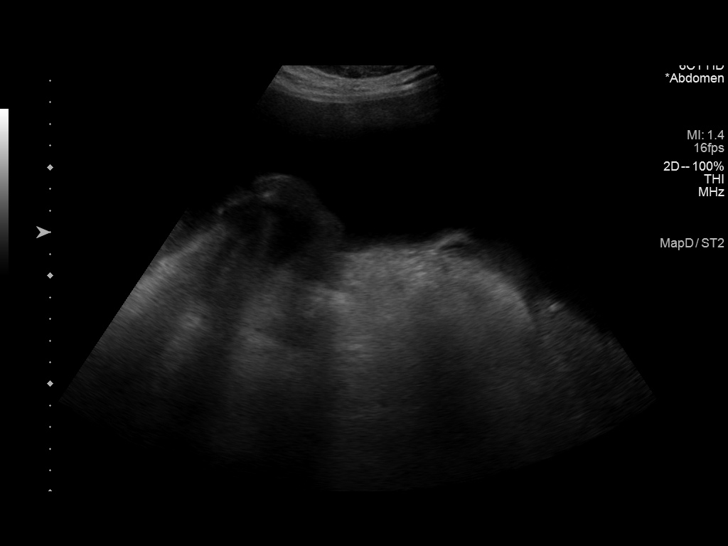
[im 16/16]
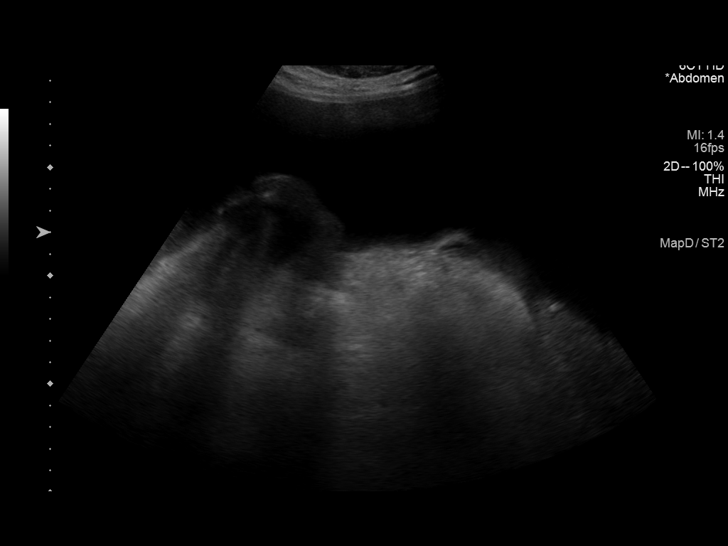

[14 of 16 positions shown; findings below may reference images not displayed]

FINDINGS: Moderate amount of simple-appearing fluid around the liver. Small
amount of ascites in the right lower quadrant. Large amount of
ascites in the left upper quadrant and left lower quadrant.
IMPRESSION: Abdominal ascites, largest amount of fluid is located in the left
abdomen.

## 2017-06-02 MED ORDER — WARFARIN SODIUM 2.5 MG PO TABS
12.5000 mg | ORAL_TABLET | Freq: Once | ORAL | Status: AC
  Administered 2017-06-02: 12.5 mg via ORAL
  Filled 2017-06-02: qty 1

## 2017-06-02 MED ORDER — HEPARIN (PORCINE) IN NACL 100-0.45 UNIT/ML-% IJ SOLN
1900.0000 [IU]/h | INTRAMUSCULAR | Status: DC
  Administered 2017-06-02 (×2): 1900 [IU]/h via INTRAVENOUS
  Filled 2017-06-02 (×2): qty 250

## 2017-06-02 NOTE — Progress Notes (Signed)
ANTICOAGULATION CONSULT NOTE - Follow Up Consult  Pharmacy Consult for Heparin Indication: LVAD  Allergies  Allergen Reactions  . Codeine Nausea And Vomiting and Other (See Comments)    In high doses  . Trazodone And Nefazodone Other (See Comments)    Dizziness with subsequent fall   . Lipitor [Atorvastatin] Nausea Only and Other (See Comments)    Nausea with high doses, tolerates 20mg  dose (08/22/16)  . Tape Other (See Comments)    Paper tape is ok    Patient Measurements: Height: 5\' 9"  (175.3 cm) Weight: 212 lb 8.4 oz (96.4 kg) IBW/kg (Calculated) : 70.7 Heparin Dosing Weight: 92kg  Vital Signs: Temp: 97.7 F (36.5 C) (08/30 1256) Temp Source: Oral (08/30 1256) BP: 91/74 (08/30 1300) Pulse Rate: 73 (08/30 0921)  Labs:  Recent Labs  05/31/17 1034  06/01/17 0435 06/01/17 0620 06/02/17 0241 06/02/17 1647  HGB 8.8*  --  8.2*  --  8.7*  --   HCT 28.1*  --  26.5*  --  27.9*  --   PLT 186  --  195  --  220  --   LABPROT 20.3*  --  21.0*  --  19.5*  --   INR 1.75  --  1.83  --  1.67  --   HEPARINUNFRC  --   < > 0.41 0.46  --  0.41  CREATININE 1.66*  --  1.31*  --  1.23  --   < > = values in this interval not displayed.  Estimated Creatinine Clearance: 73.2 mL/min (by C-G formula based on SCr of 1.23 mg/dL).  . DOBUTamine 6 mcg/kg/min (06/02/17 0500)  . heparin 1,900 Units/hr (06/02/17 1043)     Assessment: 61yom on coumadin pta for his LVAD, admitted for a capsule endoscopy to evaluate melena with symptomatic anemia. INR 1.79 on admit, coumadin held and he was started on heparin. INR 1.8 yesterday, heparin d/c'ed, coumadin resumed, and capsule study done. Today his INR has trended back down to 1.67 so will restart heparin. Capsule study results pending.  PTA coumadin dose: 10mg  daily except 5mg  on Tuesday - last dose 8/27  PM f/u > heparin level at goal after restart.  No overt bleeding or complications noted.  Goal of Therapy:  INR 2-2.5 Heparin level  0.3-0.5 units/ml Monitor platelets by anticoagulation protocol: Yes   Plan:  1) Resume heparin at 1900 units/hr  2) Daily heparin level, INR, CBC  Uvaldo Rising, BCPS  Clinical Pharmacist Pager [phone]  06/02/2017 5:37 PM

## 2017-06-02 NOTE — Progress Notes (Signed)
Video capsule endoscopy performed and reviewed Finding: 1. Mild antral gastritis -- no active bleeding 2. Normal small bowel -- no pathology seen. No angiectasias seen. No evidence of active or recent bleeding 3. Minimal views in the right colon and secured by green/brown stool 4. Possible, tiny angiectasia, nonbleeding in the right colon.  Discussed with Dr. Haroldine Laws.  Hgb has trended up today slightly. No overt bleeding. Will observe for now per our discussion and not plan further endoscopic investigate/intervention at this time. GI will remain available, call with questions

## 2017-06-02 NOTE — Progress Notes (Signed)
ANTICOAGULATION CONSULT NOTE - Follow Up Consult  Pharmacy Consult for Heparin and Coumadin Indication: LVAD  Allergies  Allergen Reactions  . Codeine Nausea And Vomiting and Other (See Comments)    In high doses  . Trazodone And Nefazodone Other (See Comments)    Dizziness with subsequent fall   . Lipitor [Atorvastatin] Nausea Only and Other (See Comments)    Nausea with high doses, tolerates 20mg  dose (08/22/16)  . Tape Other (See Comments)    Paper tape is ok    Patient Measurements: Height: 5\' 9"  (175.3 cm) Weight: 212 lb 8.4 oz (96.4 kg) IBW/kg (Calculated) : 70.7 Heparin Dosing Weight: 92kg  Vital Signs: Temp: 98 F (36.7 C) (08/30 0921) Temp Source: Oral (08/30 0921) BP: 85/69 (08/30 0921) Pulse Rate: 73 (08/30 0921)  Labs:  Recent Labs  05/31/17 1034 05/31/17 2017 06/01/17 0435 06/01/17 0620 06/02/17 0241  HGB 8.8*  --  8.2*  --  8.7*  HCT 28.1*  --  26.5*  --  27.9*  PLT 186  --  195  --  220  LABPROT 20.3*  --  21.0*  --  19.5*  INR 1.75  --  1.83  --  1.67  HEPARINUNFRC  --  0.14* 0.41 0.46  --   CREATININE 1.66*  --  1.31*  --  1.23    Estimated Creatinine Clearance: 73.2 mL/min (by C-G formula based on SCr of 1.23 mg/dL).  Assessment: 61yom on coumadin pta for his LVAD, admitted for a capsule endoscopy to evaluate melena with symptomatic anemia. INR 1.79 on admit, coumadin held and he was started on heparin. INR 1.8 yesterday, heparin d/c'ed, coumadin resumed, and capsule study done. Today his INR has trended back down to 1.67 so will restart heparin. Capsule study results pending.  PTA coumadin dose: 10mg  daily except 5mg  on Tuesday - last dose 8/27  Goal of Therapy:  INR 2-2.5 Heparin level 0.3-0.5 units/ml Monitor platelets by anticoagulation protocol: Yes   Plan:  1) Resume heparin at 1900 units/hr  2) Check 6 hour heparin level 3) Increase coumadin to 12.5mg  tonight 4) Daily heparin level, INR, CBC 5) Follow up capsule study  results  Nena Jordan, PharmD, BCPS 06/02/2017 12:28 PM

## 2017-06-02 NOTE — Progress Notes (Signed)
LVAD Coordinator Rounding Note:  Admitted 05/31/17 as OBS for capsule study per GI. Awaiting results.  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 Prescott Gum.    Vital signs: HR: 78vpaced Doppler Pressure: 78 Automatic BP: unable to obtain O2 Sat: 95RA Wt: 211>211  LVAD interrogation reveals:  Speed:5900 Flow: 5.7 Power: 5 PI: 3.1 Alarms: none Events: none Fixed speed: 5900 Low speed limit: 5600 HCT: 28  Drive Line: weekly. Due on Mondays.  Labs:  LDH trend: 208>163>168  INR trend: 1.75>1.83>1.67  Anticoagulation Plan: -INR Goal: 2-2.5 -ASA Dose: 81 mg  Gtts: Dobutamine 6 mcg   Plan/Recommendations:   1. Awaiting results of capsule study. 2. Please page VAD pager with any concerns 3. Drive line dressing due to be changed Monday  Balinda Quails RN, VAD Coordinator 24/7 pager [phone]

## 2017-06-02 NOTE — Progress Notes (Signed)
Advanced Heart Failure VAD Team Note  Subjective:    Administered Capsule Endoscopy 06/01/17. Results pending. If + will need double balloon EGD at [facility] (Only performed on Tuesdays)  Feeling better today. Willing to stay if he has to. Last BM yesterday afternoon. Remained Dark brown to black. Understands risk if not bridged with heparin.   Hgb 8.7. INR down to 1.67  LVAD INTERROGATION:  HeartMate II LVAD:  Flow 5.7 liters/min, speed 5900, power 5.0, PI 3.1.  Occasional PI events.    Objective:    Vital Signs:   Temp:  [97.3 F (36.3 C)-98.3 F (36.8 C)] 98 F (36.7 C) (08/30 0235) Pulse Rate:  [64] 64 (08/30 0235) Resp:  [18-26] 20 (08/30 0235) BP: (68-95)/(57-75) 92/69 (08/30 0400) SpO2:  [91 %-97 %] 97 % (08/30 0235) Weight:  [212 lb 8.4 oz (96.4 kg)] 212 lb 8.4 oz (96.4 kg) (08/30 0235) Last BM Date: 05/31/17 Mean arterial Pressure 70s  Intake/Output:   Intake/Output Summary (Last 24 hours) at 06/02/17 0911 Last data filed at 06/02/17 0900  Gross per 24 hour  Intake           1869.2 ml  Output             2325 ml  Net           -455.8 ml     Physical Exam    GENERAL: Fatigued.  NAD.  HEENT: Normal. NECK: Supple, JVP 7-8 cm. Carotids OK.  CARDIAC:  Mechanical heart sounds with LVAD hum present.  LUNGS:  CTAB, normal effort.  ABDOMEN:  NT, ND, no HSM. No bruits or masses. +BS  LVAD exit site: Well-healed and incorporated. Dressing dry and intact. No erythema or drainage. Stabilization device present and accurately applied. Driveline dressing changed daily per sterile technique. EXTREMITIES:  Warm and dry. No cyanosis, clubbing, rash, or edema.  NEUROLOGIC:  Alert & oriented x 3. Cranial nerves grossly intact. Moves all 4 extremities w/o difficulty. Affect pleasant     Telemetry   Personally reviewed, NSR.   EKG    N/A  Labs   Basic Metabolic Panel:  Recent Labs Lab 05/31/17 1034 06/01/17 0435 06/02/17 0241  NA 132* 136 133*  K 4.8 3.8 4.0  CL  92* 94* 98*  CO2 30 31 28   GLUCOSE 204* 92 111*  BUN 40* 29* 22*  CREATININE 1.66* 1.31* 1.23  CALCIUM 9.1 8.6* 8.3*    Liver Function Tests:  Recent Labs Lab 05/31/17 1034  AST 23  ALT 26  ALKPHOS 77  BILITOT 0.6  PROT 7.1  ALBUMIN 3.5   No results for input(s): LIPASE, AMYLASE in the last 168 hours. No results for input(s): AMMONIA in the last 168 hours.  CBC:  Recent Labs Lab 05/26/17 1044 05/30/17 1152 05/31/17 1034 06/01/17 0435 06/02/17 0241  WBC 4.1 7.0 8.1 6.7 7.2  NEUTROABS  --   --  6.8  --   --   HGB 7.6* 8.9* 8.8* 8.2* 8.7*  HCT 23.8* 27.6* 28.1* 26.5* 27.9*  MCV 85.9 87.1 87.5 87.2 87.5  PLT 152 171 186 195 220    INR:  Recent Labs Lab 05/26/17 1044 05/30/17 1152 05/31/17 1034 06/01/17 0435 06/02/17 0241  INR 1.81 1.79 1.75 1.83 1.67    Other results:  EKG:    Imaging   No results found.   Medications:     Scheduled Medications: . amiodarone  200 mg Oral Daily  . citalopram  20 mg Oral Daily  .  digoxin  0.125 mg Oral Daily  . docusate sodium  200 mg Oral QHS  . gabapentin  200 mg Oral BID  . insulin aspart  0-15 Units Subcutaneous TID WC  . insulin aspart  5 Units Subcutaneous TID WC  . insulin glargine  10 Units Subcutaneous Q2200  . losartan  25 mg Oral Daily  . magnesium oxide  400 mg Oral Daily  . multivitamin with minerals  1 tablet Oral Daily  . pantoprazole  40 mg Oral BID AC  . potassium chloride SA  40 mEq Oral BID  . sildenafil  40 mg Oral TID  . spironolactone  25 mg Oral Daily  . sucralfate  1 g Oral TID WC & HS  . torsemide  100 mg Oral Daily  . torsemide  50 mg Oral Daily  . Warfarin - Pharmacist Dosing Inpatient   Does not apply q1800    Infusions: . DOBUTamine 6 mcg/kg/min (06/02/17 0500)    PRN Medications: acetaminophen, ondansetron (ZOFRAN) IV, ondansetron, senna-docusate, traMADol, zolpidem   Patient Profile   Ronald Miller Jr.is a 61 y.o.male with history of chronic systolic HF s/p  [employer] ICD 2014, CAD s/p CABG in 2000, HTN, Hx of cocaine abuse, Tobacco abuse, Depression, PAF, and COPD. HM3 placed 09/09/2016.   Admitted for Observation for capsule study via [place].   Assessment/Plan:    1. Chronic end-stage biventricular systolic XQ:JJHE 17% due to ICM ->s/p Echo 08/24/16 LVEF 15%, RV mild dilated, moderately reduced. -->S/P HM3 LVAD 40/05/1447.  - Complicated by RV failure. Initially on milrinone but stopped due to low mixed venous saturation. Now on dobutamine 7.5. Recently titrated due to low co-ox.  - Coox 56.6%.  Feels OK. May be related to anemia.  - Volume status stable on exam.  - Continue torsemide 100 mg q am and 50 mg q pm.  - Has been turned down for [facility]. He is now motivated to requalify. Encouraged him to stay motivated. No change.  - Continue sildenafil 40 tid.  - No beta blocker with RV failure - Continue digoxin 0.125 mcg daily.  - VAD parameters reviewed personally. Stable.  - Warfarin with goal INR 2.0 - 2.5 + ASA 81 daily. - INR 1.67. Dosing per pharmacy.    - LDH 208 -> 187 -> 168 - Had epistaxis with lovenox, will avoid this in the future.  2. CAD: Severe 3v- CAD s/p CABG with occluded grafts as above except for LIMA.  - No CP. No s/s of ischemia.   3. DMII:  - Continue evening lantus. + SSI. No change.   4. Tobacco abuse:  - Reports complete cessation. No change.   5. Atrial fibrillation, paroxysmal - Remains in NSR. Continue daily amio. 6. Anxiety/Depression - Continue Celexa 20mg  daily  - Living with his sister seems to have helped tremendously - Stressed need to f/u with counselors to help re-qualify for transplant. - No change.  8. RUE DVT.  - Continue warfarin. No change.  9. Hyponatremia:  - Na 133. Restrict free water.   10. Anemia, iron-deficiency:  - Hgb 8.2 -> 8.7 without intervention.  - Capsule endoscopy administered 06/01/17. Awaiting results.   I reviewed the LVAD parameters from today, and  compared the results to the patient's prior recorded data.  No programming changes were made.  The LVAD is functioning within specified parameters.  The patient performs LVAD self-test daily.  LVAD interrogation was negative for any significant power changes, alarms or PI  events/speed drops.  LVAD equipment check completed and is in good working order.  Back-up equipment present.   LVAD education done on emergency procedures and precautions and reviewed exit site care.   Length of Stay: 2  Annamaria Helling 06/02/2017, 9:11 AM  VAD Team --- VAD ISSUES ONLY--- Pager [phone] (7am - 7am)  Advanced Heart Failure Team  Pager [phone] (M-F; 7a - 4p)  Please contact [facility] for night-coverage after hours (4p -7a ) and weekends on amion.  Patient seen and examined with the above-signed Advanced Practice Provider and/or Housestaff. I personally reviewed laboratory data, imaging studies and relevant notes. I independently examined the patient and formulated the important aspects of the plan. I have edited the note to reflect any of my changes or salient points. I have personally discussed the plan with the patient and/or family.  Still with black stools. Hgb stable though. Await results of capsule today. INR now falling. He is weak.   Will need heparin/coumadin. Change to admit status. D/w PharmD.   VAD parameters stable.   Glori Bickers, MD  9:52 AM

## 2017-06-03 ENCOUNTER — Other Ambulatory Visit (HOSPITAL_COMMUNITY): Payer: Self-pay | Admitting: Unknown Physician Specialty

## 2017-06-03 ENCOUNTER — Encounter (HOSPITAL_COMMUNITY): Payer: Self-pay | Admitting: Internal Medicine

## 2017-06-03 DIAGNOSIS — Z95811 Presence of heart assist device: Secondary | ICD-10-CM

## 2017-06-03 DIAGNOSIS — Z7901 Long term (current) use of anticoagulants: Secondary | ICD-10-CM

## 2017-06-03 LAB — BASIC METABOLIC PANEL
Anion gap: 8 (ref 5–15)
BUN: 22 mg/dL — ABNORMAL HIGH (ref 6–20)
CALCIUM: 9 mg/dL (ref 8.9–10.3)
CO2: 32 mmol/L (ref 22–32)
Chloride: 91 mmol/L — ABNORMAL LOW (ref 101–111)
Creatinine, Ser: 1.3 mg/dL — ABNORMAL HIGH (ref 0.61–1.24)
GFR calc Af Amer: >60 mL/min (ref >60)
GFR, EST NON AFRICAN AMERICAN: 58 mL/min — AB (ref >60)
Glucose, Bld: 169 mg/dL — ABNORMAL HIGH (ref 65–99)
POTASSIUM: 4.4 mmol/L (ref 3.5–5.1)
SODIUM: 131 mmol/L — AB (ref 135–145)

## 2017-06-03 LAB — CBC
HCT: 28.2 % — ABNORMAL LOW (ref 39.0–52.0)
HEMOGLOBIN: 8.7 g/dL — AB (ref 13.0–17.0)
MCH: 26.7 pg (ref 26.0–34.0)
MCHC: 30.9 g/dL (ref 30.0–36.0)
MCV: 86.5 fL (ref 78.0–100.0)
Platelets: 234 10*3/uL (ref 150–400)
RBC: 3.26 MIL/uL — ABNORMAL LOW (ref 4.22–5.81)
RDW: 19.5 % — ABNORMAL HIGH (ref 11.5–15.5)
WBC: 7.3 10*3/uL (ref 4.0–10.5)

## 2017-06-03 LAB — GLUCOSE, CAPILLARY
GLUCOSE-CAPILLARY: 191 mg/dL — AB (ref 65–99)
GLUCOSE-CAPILLARY: 162 mg/dL — AB (ref 65–99)

## 2017-06-03 LAB — HEPARIN LEVEL (UNFRACTIONATED): HEPARIN UNFRACTIONATED: 0.49 [IU]/mL (ref 0.30–0.70)

## 2017-06-03 LAB — PROTIME-INR
INR: 1.97
PROTHROMBIN TIME: 22.2 s — AB (ref 11.4–15.2)

## 2017-06-03 LAB — LACTATE DEHYDROGENASE: LDH: 145 U/L (ref 98–192)

## 2017-06-03 MED ORDER — LOSARTAN POTASSIUM 25 MG PO TABS
12.5000 mg | ORAL_TABLET | Freq: Every day | ORAL | Status: DC
Start: 2017-06-03 — End: 2017-06-03

## 2017-06-03 MED ORDER — TORSEMIDE 20 MG PO TABS: 100.0000 mg | ORAL_TABLET | Freq: Every day | ORAL | Status: DC

## 2017-06-03 MED ORDER — LOSARTAN POTASSIUM 25 MG PO TABS: 12.5000 mg | ORAL_TABLET | Freq: Every day | ORAL | 3 refills | Status: DC

## 2017-06-03 MED ORDER — SPIRONOLACTONE 25 MG PO TABS: 25.0000 mg | ORAL_TABLET | Freq: Every day | ORAL | 6 refills | Status: DC

## 2017-06-03 NOTE — Progress Notes (Signed)
LVAD Coordinator Rounding Note:  Admitted 05/31/17 as OBS for capsule study per GI.   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 Prescott Gum as DT VAD.    Vital signs: HR: 65 vpaced Doppler Pressure: 72 reflecting modified systolic BP Automatic BP:  71/54 (58) O2 Sat: 98%RA Wt in lbs: 220>211>211>212>211  LVAD interrogation reveals:  Speed:5900 Flow: 5.7 Power: 4.7w PI: 2.9 Alarms: none Events: none Fixed speed: 5900 Low speed limit: 5600 HCT: 28  Drive Line: weekly. Due on Mondays.  Labs:  LDH trend: 208>187>163>168>145  INR trend: 1.79>1.75>1.83>1.67>1.97  Anticoagulation Plan: -INR Goal: 2-2.5 -ASA - stopped last admission due to GI bleed  Gtts: Dobutamine 6 mcg   Plan/Recommendations:   1. Discharge home today. 2. Please page VAD pager with any concerns 3. Drive line dressing due to be changed Monday  Zada Girt RN, VAD Coordinator 24/7 pager [phone]

## 2017-06-03 NOTE — Progress Notes (Signed)
Pt discharged home with belongings via w/c, dobutamine infusing with home pump thru picc line, discharge instructions explained and discussed, appt given, prescriptions to be picked up at [facility] in [place]. No complaints voiced at this time.

## 2017-06-03 NOTE — Discharge Summary (Addendum)
Advanced Heart Failure Team  Discharge Summary   Patient ID: Ronald Miller. MRN: [id number], DOB/AGE: 1956/03/18 61 y.o. Admit date: 05/31/2017 D/C date:     06/03/2017   Primary Discharge Diagnoses:  1. Melena with symptomatic iron deficient anemia due to acute on chronic blood loss 2. 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  3. CAD 4. DMII 5. Tobacco abuse 6. Atrial fibrillation, paroxysmal 7. Anxiety/Depression 8. RUE DVT 9. Hyponatremia  Hospital Course:   Ronald Miller. is a 61 y.o. male male with history of chronic systolic HF s/p [employer] ICD 2014, CAD s/p CABG in 2000, HTN, Hx of cocaine abuse, Tobacco abuse, Depression, PAF, and COPD. HM3 placed 09/09/2016.   Admitted for planned inpatient procedure 05/31/17 for capsule endoscopy due to ongoing melena and anemia.   Capsule endoscopy performed 06/01/17 which showed: 1. Mild antral gastritis -- no active bleeding 2. Normal small bowel -- no pathology seen. No angiectasias seen. No evidence of active or recent bleeding 3. Minimal views in the right colon and secured by green/brown stool 4. Possible, tiny angiectasia, nonbleeding in the right colon.  Hospital course complicated by subtherapeutic INR requiring heparin bridge.   Hgb trended up without blood transfusion and melena tapered off.   Seen am of 06/03/17 and with decreased melena, stable Hgb, and negative capsule study thought stable for discharge with follow up as below.  Pt did have lower maps so losartan cut back and morning torsemide held.   He will have INR check closely on 06/07/17.  Physical Exam GENERAL: Fatigued. NAD.  HEENT: Normal. NECK: Supple, JVP 7-8 cm. Carotids OK.  CARDIAC:  Mechanical heart sounds with LVAD hum present.  LUNGS:  CTAB, normal effort.  ABDOMEN:  NT, ND, no HSM. No bruits or masses. +BS  LVAD exit site: Well-healed and incorporated. Dressing dry and intact. No erythema or drainage.  Stabilization device present and accurately applied. Driveline dressing changed daily per sterile technique. EXTREMITIES:  Warm and dry. No cyanosis, clubbing, rash, or edema.  NEUROLOGIC:  Alert & oriented x 3. Cranial nerves grossly intact. Moves all 4 extremities w/o difficulty. Affect pleasant     LVAD Interrogation HM II:   Speed: 5900 Flow: 5.7 PI: 4.6  Power: 3.0  Discharge Weight: 211 lbs Discharge Vitals: Blood pressure (!) 71/54, pulse 66, temperature 97.8 F (36.6 C), temperature source Oral, resp. rate 18, height 5\' 9"  (1.753 m), weight 211 lb 3.2 oz (95.8 kg), SpO2 98 %.  Labs: Lab Results  Component Value Date   WBC 7.3 06/03/2017   HGB 8.7 (L) 06/03/2017   HCT 28.2 (L) 06/03/2017   MCV 86.5 06/03/2017   PLT 234 06/03/2017    Recent Labs Lab 05/31/17 1034  06/03/17 0143  NA 132*  < > 131*  K 4.8  < > 4.4  CL 92*  < > 91*  CO2 30  < > 32  BUN 40*  < > 22*  CREATININE 1.66*  < > 1.30*  CALCIUM 9.1  < > 9.0  PROT 7.1  --   --   BILITOT 0.6  --   --   ALKPHOS 77  --   --   ALT 26  --   --   AST 23  --   --   GLUCOSE 204*  < > 169*  < > = values in this interval not displayed. Lab Results  Component Value Date   CHOL 153 08/15/2013   HDL 37 (  L) 08/15/2013   LDLCALC 89 08/15/2013   TRIG 133 08/15/2013   BNP (last 3 results)  Recent Labs  10/13/16 1207 11/30/16 1158 01/21/17 1230  BNP 321.5* 565.5* 649.9*    ProBNP (last 3 results) No results for input(s): PROBNP in the last 8760 hours.   Diagnostic Studies/Procedures   No results found.  Discharge Medications   Allergies as of 06/03/2017      Reactions   Codeine Nausea And Vomiting, Other (See Comments)   In high doses   Trazodone And Nefazodone Other (See Comments)   Dizziness with subsequent fall    Lipitor [atorvastatin] Nausea Only, Other (See Comments)   Nausea with high doses, tolerates 20mg  dose (08/22/16)   Tape Other (See Comments)   Paper tape is ok      Medication List     STOP taking these medications   clonazePAM 0.25 MG disintegrating tablet Commonly known as:  KLONOPIN     TAKE these medications   acetaminophen 500 MG tablet Commonly known as:  TYLENOL Take 1,000 mg by mouth every 6 (six) hours as needed for moderate pain or headache.   amiodarone 200 MG tablet Commonly known as:  PACERONE Take 1 tablet (200 mg total) by mouth daily.   citalopram 20 MG tablet Commonly known as:  CELEXA Take 1 tablet (20 mg total) by mouth daily.   digoxin 0.125 MG tablet Commonly known as:  DIGOX Take 1 tablet (0.125 mg total) by mouth daily.   DOBUTamine 4-5 MG/ML-% infusion Commonly known as:  DOBUTREX Inject 571.8 mcg/min into the vein continuous. PER [facility] pharmacy   docusate sodium 100 MG capsule Commonly known as:  COLACE Take 200 mg by mouth at bedtime.   gabapentin 100 MG capsule Commonly known as:  NEURONTIN Take 2 capsules (200 mg total) by mouth 2 (two) times daily.   insulin aspart 100 UNIT/ML FlexPen Commonly known as:  NOVOLOG FLEXPEN Inject 5 Units into the skin 3 (three) times daily with meals. What changed:  how much to take   Insulin Glargine 100 UNIT/ML Solostar Pen Commonly known as:  LANTUS SOLOSTAR Inject 20 Units into the skin daily at 10 pm. What changed:  how much to take   losartan 25 MG tablet Commonly known as:  COZAAR Take 0.5 tablets (12.5 mg total) by mouth daily. What changed:  how much to take   magnesium oxide 400 (241.3 Mg) MG tablet Commonly known as:  MAG-OX Take 1 tablet (400 mg total) by mouth daily.   multivitamin with minerals Tabs tablet Take 1 tablet by mouth daily.   ondansetron 4 MG disintegrating tablet Commonly known as:  ZOFRAN ODT Take 1 tablet (4 mg total) by mouth every 8 (eight) hours as needed for nausea.   pantoprazole 40 MG tablet Commonly known as:  PROTONIX Take 1 tablet (40 mg total) by mouth 2 (two) times daily before a meal.   potassium chloride SA 20 MEQ tablet Commonly  known as:  K-DUR,KLOR-CON Take 2 tablets (40 mEq total) by mouth 2 (two) times daily.   senna-docusate 8.6-50 MG tablet Commonly known as:  SENOKOT S Take 2 tablets by mouth at bedtime as needed for mild constipation.   sildenafil 20 MG tablet Commonly known as:  REVATIO Take 2 tablets (40 mg total) by mouth 3 (three) times daily.   spironolactone 25 MG tablet Commonly known as:  ALDACTONE Take 1 tablet (25 mg total) by mouth at bedtime. What changed:  when to take this  sucralfate 1 GM/10ML suspension Commonly known as:  CARAFATE Take 10 mLs (1 g total) by mouth 4 (four) times daily -  with meals and at bedtime.   torsemide 100 MG tablet Commonly known as:  DEMADEX Take 100 mg in the morning and 50 mg in the evening   traMADol 50 MG tablet Commonly known as:  ULTRAM Take 1 tablet (50 mg total) by mouth 3 (three) times daily as needed for severe pain.   warfarin 5 MG tablet Commonly known as:  COUMADIN Take 10 mg (2 tablets) daily except 5 mg (1 tablet) on Tuesday   zolpidem 10 MG tablet Commonly known as:  AMBIEN Take 1 tablet (10 mg total) by mouth at bedtime as needed for sleep. What changed:  when to take this            Durable Medical Equipment        Start     Ordered   06/01/17 1039  Heart failure home health orders  (Heart failure home health orders / Face to face)  Once    Comments:  Heart Failure Follow-up Care:  Verify follow-up appointments per Patient Discharge Instructions. Confirm transportation arranged. Reconcile home medications with discharge medication list. Remove discontinued medications from use. Assist patient/caregiver to manage medications using pill box. Reinforce low sodium food selection Assessments: Vital signs and oxygen saturation at each visit. Assess home environment for safety concerns, caregiver support and availability of low-sodium foods. Consult Education officer, museum, PT/OT, Dietitian, and CNA based on assessments. Perform  comprehensive cardiopulmonary assessment. Notify MD for any change in condition or weight gain of 3 pounds in one day or 5 pounds in one week with symptoms. Daily Weights and Symptom Monitoring: Ensure patient has access to scales. Teach patient/caregiver to weigh daily before breakfast and after voiding using same scale and record.    Teach patient/caregiver to track weight and symptoms and when to notify Provider. Activity: Develop individualized activity plan with patient/caregiver.  [facility] to resume dobutamine 6 mcg/kg/min x 52 weeks.  Question Answer Comment  Heart Failure Follow-up Care [facility] at [phone]   Obtain the following labs Basic Metabolic Panel   Lab frequency Other see comments   Fax lab results to [facility] at [phone]   Diet Low Sodium Heart Healthy   Fluid restrictions: 2000 mL Fluid   Initiate Heart Failure Clinic Diuretic Protocol to be used by [facility] only ( to be ordered by Heart Failure Team Providers Only) Yes      06/01/17 1039       Discharge Care Instructions        Start     Ordered   06/03/17 0000  losartan (COZAAR) 25 MG tablet  Daily    Question:  Supervising Provider  Answer:  Jolaine Artist   06/03/17 0756   06/03/17 0000  spironolactone (ALDACTONE) 25 MG tablet  Daily at bedtime    Question:  Supervising Provider  Answer:  Glori Bickers R   06/03/17 0756   06/03/17 0000  Diet - low sodium heart healthy     06/03/17 0756   06/03/17 0000  Increase activity slowly     06/03/17 0756      Disposition   The patient will be discharged in stable condition to home.  Discharge Instructions    Diet - low sodium heart healthy    Complete by:  As directed    Increase activity slowly    Complete  by:  As directed      Follow-up Information    [facility] up on 06/07/2017.   Specialty:  Cardiology Why:  at 1130 for post hospital labs.  Code for parking is 7002. Contact information: [address] [id number] [place] [phone]            Duration of Discharge Encounter: Greater than 35 minutes   Signed, Annamaria Helling  06/03/2017, 7:57 AM   Patient seen and examined with the above-signed Advanced Practice Provider and/or Housestaff. I personally reviewed laboratory data, imaging studies and relevant notes. I independently examined the patient and formulated the important aspects of the plan. I have edited the note to reflect any of my changes or salient points. I have personally discussed the plan with the patient and/or family.  Results of capsule endo reviewed with him. He looks good today. INR 1.97. Hgb stable. No further melena. MAPs low last night. Agree with changes to losartan and diuretic regimen. Can go home today. Ronald parameters stable this am.   [name] Friar, PA-C  7:57 AM

## 2017-06-05 ENCOUNTER — Emergency Department
Admission: EM | Admit: 2017-06-05 | Discharge: 2017-06-05 | Disposition: A | Discharge status: Home / Self Care | Payer: Medicare HMO | Attending: Emergency Medicine | Admitting: Emergency Medicine

## 2017-06-05 DIAGNOSIS — I25810 Atherosclerosis of coronary artery bypass graft(s) without angina pectoris: Secondary | ICD-10-CM | POA: Diagnosis not present

## 2017-06-05 DIAGNOSIS — Z794 Long term (current) use of insulin: Secondary | ICD-10-CM | POA: Diagnosis not present

## 2017-06-05 DIAGNOSIS — I5022 Chronic systolic (congestive) heart failure: Secondary | ICD-10-CM | POA: Diagnosis not present

## 2017-06-05 DIAGNOSIS — E119 Type 2 diabetes mellitus without complications: Secondary | ICD-10-CM | POA: Insufficient documentation

## 2017-06-05 DIAGNOSIS — I110 Hypertensive heart disease with heart failure: Secondary | ICD-10-CM | POA: Diagnosis not present

## 2017-06-05 DIAGNOSIS — Z79899 Other long term (current) drug therapy: Secondary | ICD-10-CM | POA: Diagnosis not present

## 2017-06-05 DIAGNOSIS — Z95810 Presence of automatic (implantable) cardiac defibrillator: Secondary | ICD-10-CM | POA: Insufficient documentation

## 2017-06-05 DIAGNOSIS — J449 Chronic obstructive pulmonary disease, unspecified: Secondary | ICD-10-CM | POA: Diagnosis not present

## 2017-06-05 DIAGNOSIS — Z7901 Long term (current) use of anticoagulants: Secondary | ICD-10-CM | POA: Insufficient documentation

## 2017-06-05 DIAGNOSIS — R040 Epistaxis: Secondary | ICD-10-CM | POA: Insufficient documentation

## 2017-06-05 DIAGNOSIS — Z87891 Personal history of nicotine dependence: Secondary | ICD-10-CM | POA: Insufficient documentation

## 2017-06-05 DIAGNOSIS — I2510 Atherosclerotic heart disease of native coronary artery without angina pectoris: Secondary | ICD-10-CM | POA: Diagnosis not present

## 2017-06-05 LAB — PROTIME-INR
INR: 2.54
Prothrombin Time: 27.1 seconds — ABNORMAL HIGH (ref 11.4–15.2)

## 2017-06-05 LAB — BASIC METABOLIC PANEL
ANION GAP: 11 (ref 5–15)
BUN: 51 mg/dL — ABNORMAL HIGH (ref 6–20)
CO2: 29 mmol/L (ref 22–32)
Calcium: 9 mg/dL (ref 8.9–10.3)
Chloride: 91 mmol/L — ABNORMAL LOW (ref 101–111)
Creatinine, Ser: 1.6 mg/dL — ABNORMAL HIGH (ref 0.61–1.24)
GFR, EST AFRICAN AMERICAN: 52 mL/min — AB (ref >60)
GFR, EST NON AFRICAN AMERICAN: 45 mL/min — AB (ref >60)
Glucose, Bld: 212 mg/dL — ABNORMAL HIGH (ref 65–99)
POTASSIUM: 5 mmol/L (ref 3.5–5.1)
SODIUM: 131 mmol/L — AB (ref 135–145)

## 2017-06-05 LAB — CBC WITH DIFFERENTIAL/PLATELET
BASOS ABS: 0.1 10*3/uL (ref 0–0.1)
Basophils Relative: 1 %
EOS ABS: 0.1 10*3/uL (ref 0–0.7)
EOS PCT: 1 %
HCT: 26.2 % — ABNORMAL LOW (ref 40.0–52.0)
HEMOGLOBIN: 8.8 g/dL — AB (ref 13.0–18.0)
Lymphocytes Relative: 8 %
Lymphs Abs: 0.6 10*3/uL — ABNORMAL LOW (ref 1.0–3.6)
MCH: 28.4 pg (ref 26.0–34.0)
MCHC: 33.6 g/dL (ref 32.0–36.0)
MCV: 84.4 fL (ref 80.0–100.0)
Monocytes Absolute: 0.6 10*3/uL (ref 0.2–1.0)
Monocytes Relative: 8 %
NEUTROS PCT: 82 %
Neutro Abs: 6.5 10*3/uL (ref 1.4–6.5)
PLATELETS: 256 10*3/uL (ref 150–440)
RBC: 3.11 MIL/uL — AB (ref 4.40–5.90)
RDW: 21.7 % — ABNORMAL HIGH (ref 11.5–14.5)
WBC: 7.9 10*3/uL (ref 3.8–10.6)

## 2017-06-05 MED ORDER — OXYMETAZOLINE HCL 0.05 % NA SOLN
1.0000 | Freq: Once | NASAL | Status: AC
  Administered 2017-06-05: 1 via NASAL
  Filled 2017-06-05: qty 15

## 2017-06-05 MED ORDER — TRANEXAMIC ACID 1000 MG/10ML IV SOLN
500.0000 mg | Freq: Once | INTRAVENOUS | Status: AC
  Administered 2017-06-05: 500 mg via TOPICAL
  Filled 2017-06-05: qty 10

## 2017-06-05 NOTE — ED Triage Notes (Signed)
Pt states that today at lunch time he started sneezing and then bleeding from the rt nare. Pt is unable to get it to stop, pt had lvad placed in dec

## 2017-06-05 NOTE — ED Notes (Signed)
Patient called sister to come pick him up

## 2017-06-05 NOTE — Discharge Instructions (Signed)
Your nosebleed had stopped by the time we evaluated you in the ED.  We were able to see an area where the bleeding was coming from on the right side of your nasal septum.  Your INR today was 2.5.    Due to your coumadin use, we applied Afrin and tranexamic acid to the area in your nose to help stabilize the clotted area and prevent recurrent bleeding.
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
## 2017-06-05 NOTE — ED Notes (Signed)
ENT cart at bedside

## 2017-06-06 IMAGING — US US ABDOMEN LIMITED
1 series · 9 of 9 positions shown · non-contrast
Comparison: 01/13/2016.

CLINICAL DATA: 60-year-old male with suspected ascites.

EXAM:
LIMITED ABDOMEN ULTRASOUND FOR ASCITES
TECHNIQUE: Limited ultrasound survey for ascites was performed in all four
abdominal quadrants.

[Series 1: us abdomen limited · 0.26mm/px · 9 of 9 slices shown]
[im 1/9]
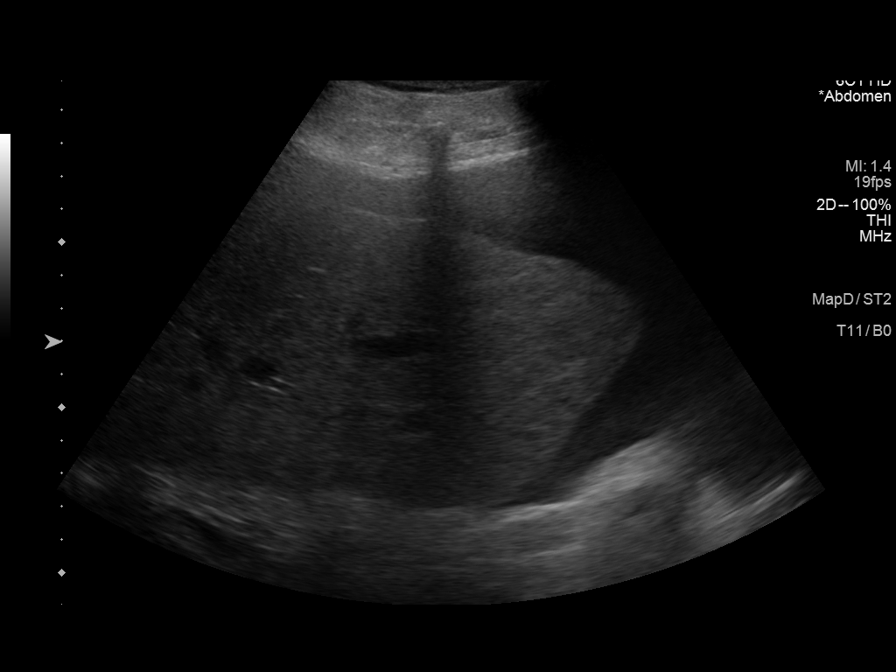
[im 2/9]
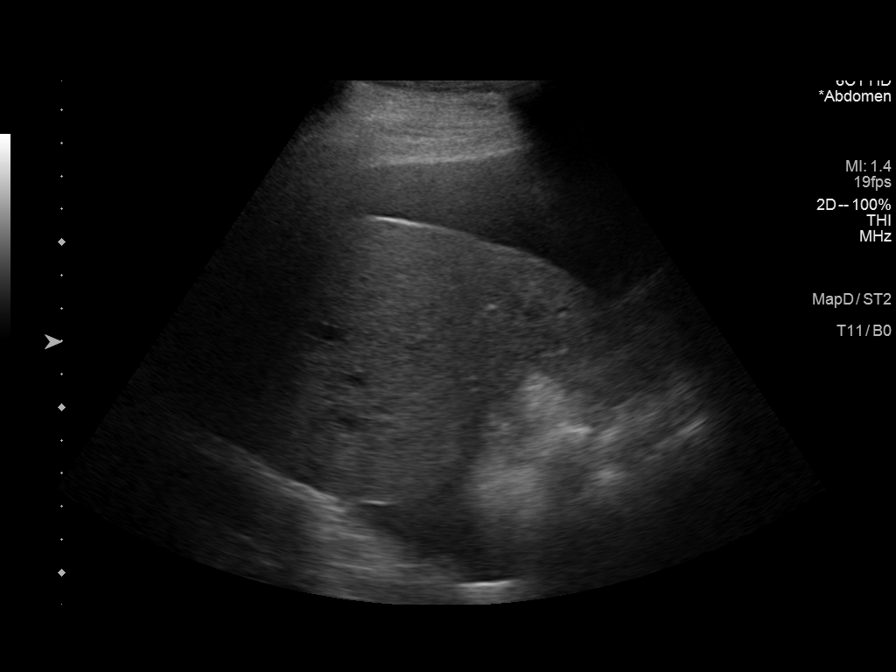
[im 3/9]
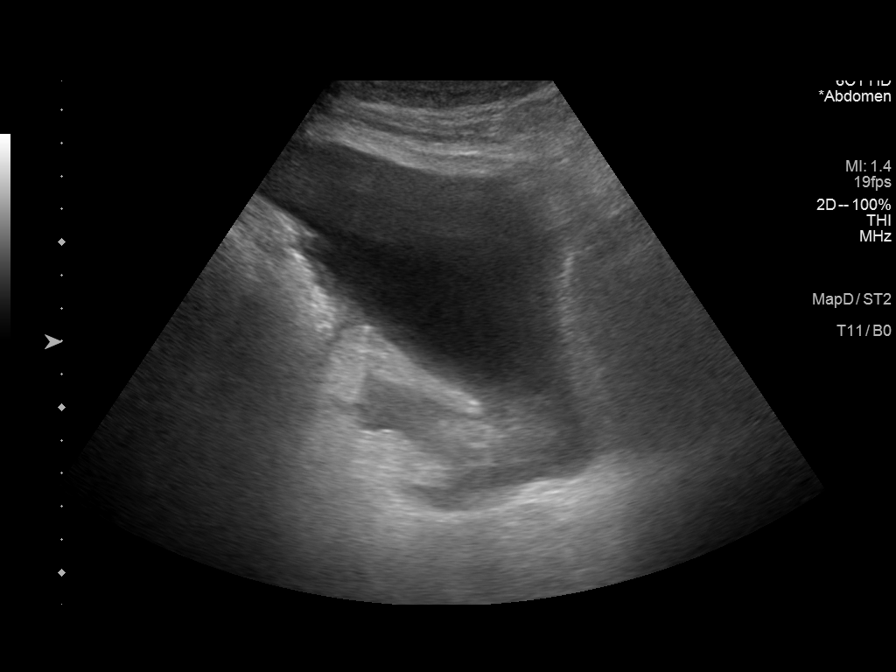
[im 4/9]
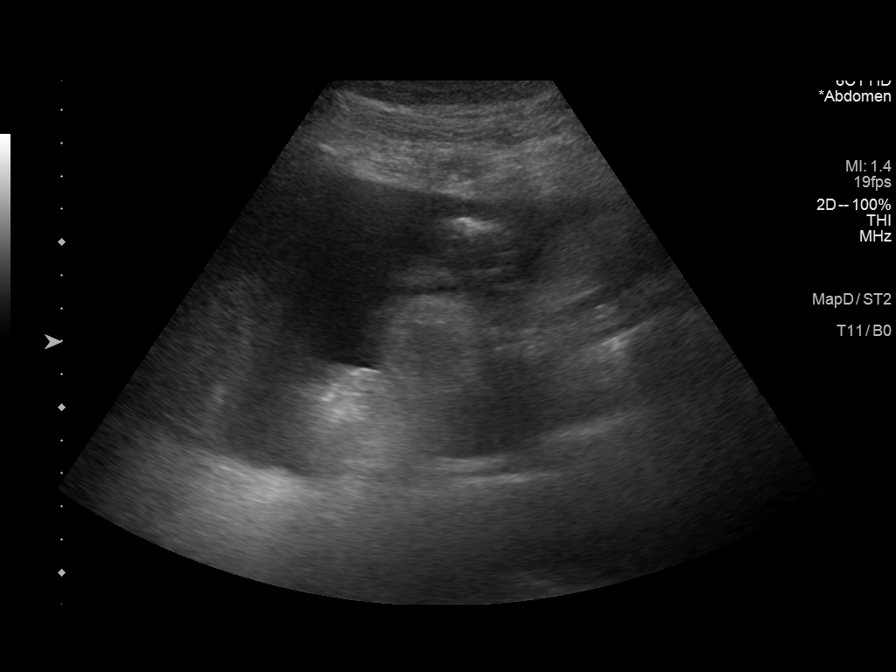
[im 5/9]
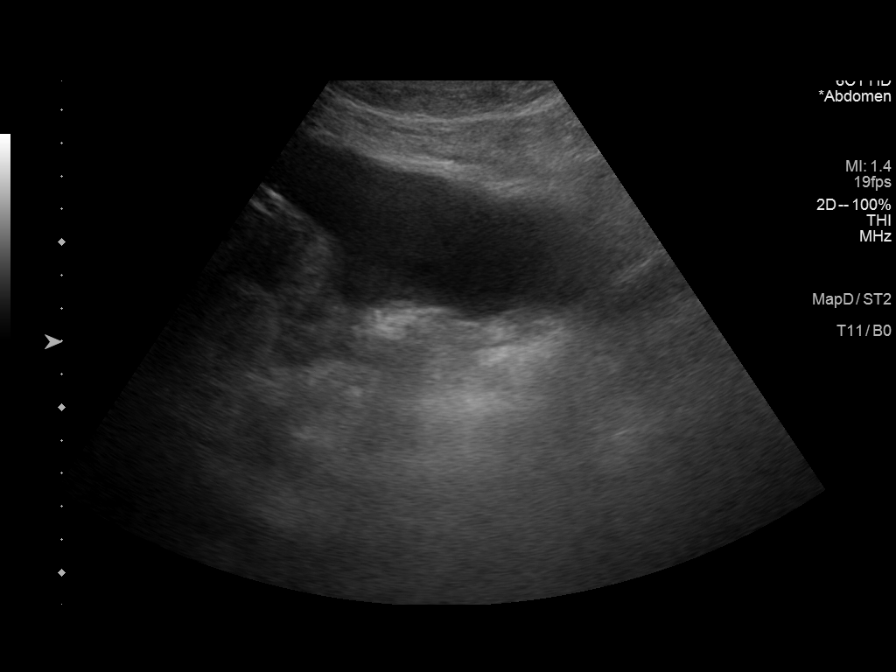
[im 6/9]
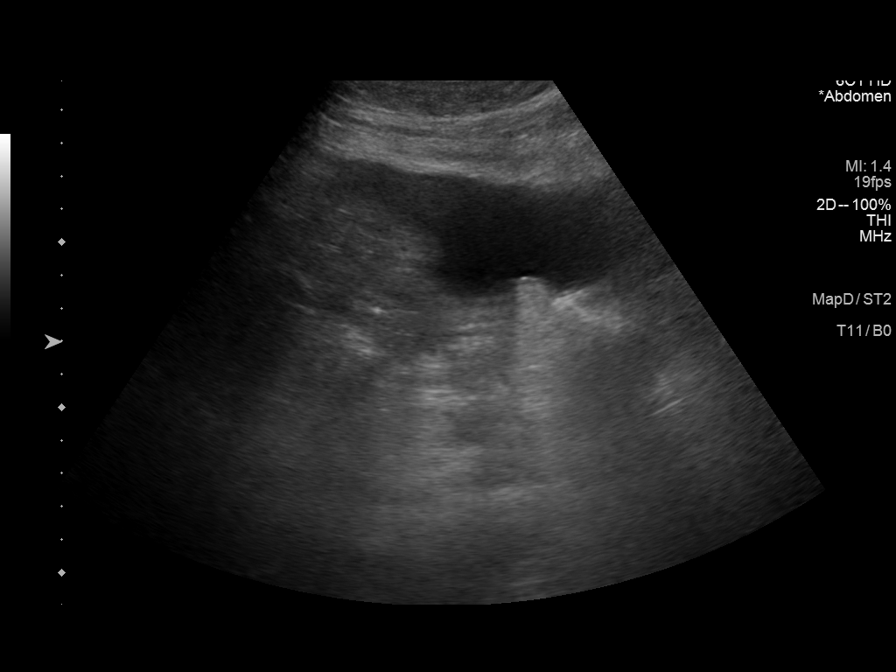
[im 7/9]
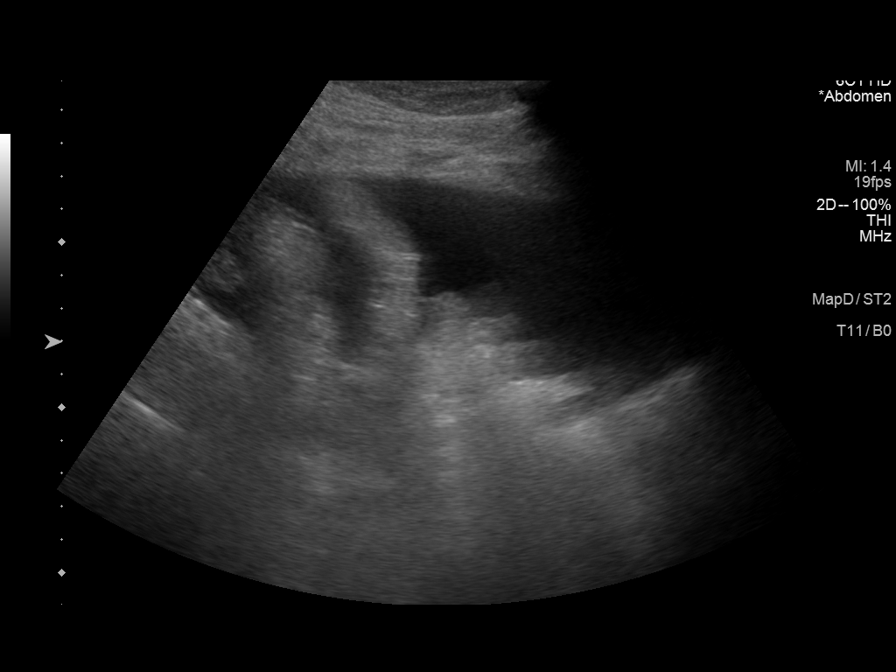
[im 8/9]
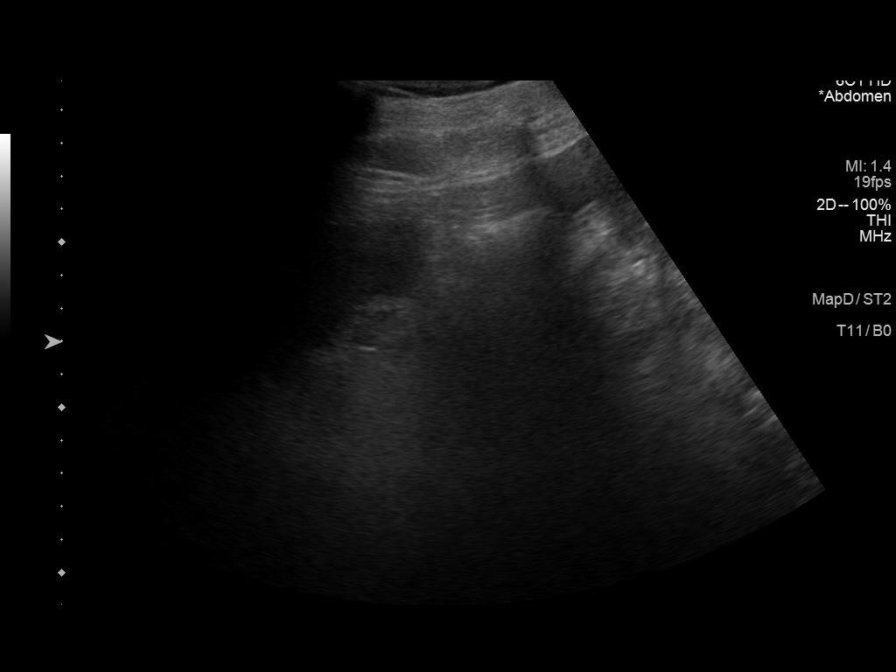
[im 9/9]
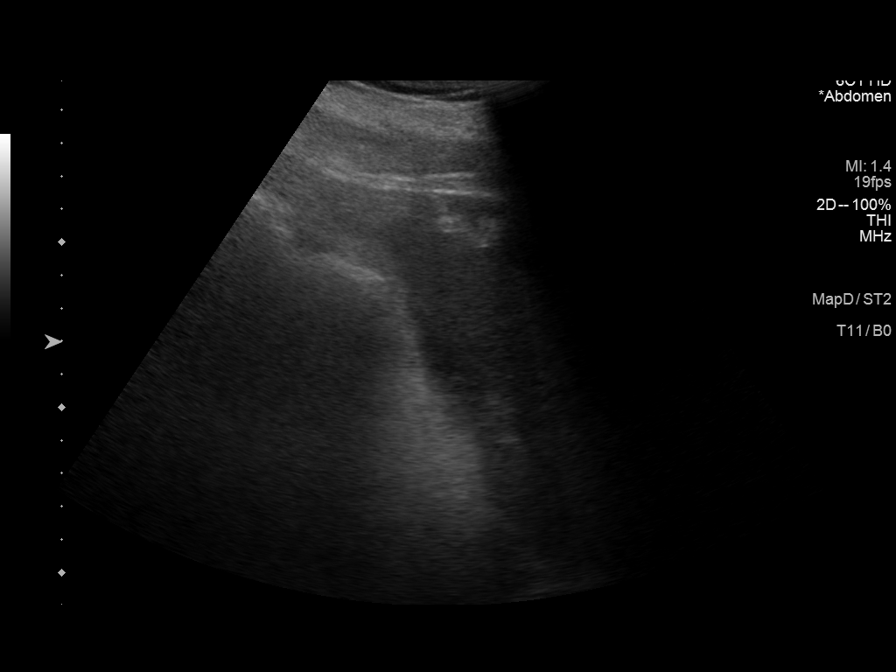

[9 of 9 positions shown; findings below may reference images not displayed]

FINDINGS: Small to moderate volume of ascites noted throughout the peritoneal
cavity.
IMPRESSION: 1. Small to moderate volume of ascites.

## 2017-06-07 ENCOUNTER — Ambulatory Visit (HOSPITAL_COMMUNITY): Payer: Self-pay | Admitting: Pharmacist

## 2017-06-07 ENCOUNTER — Ambulatory Visit (HOSPITAL_COMMUNITY)
Admission: RE | Admit: 2017-06-07 | Discharge: 2017-06-07 | Disposition: A | Discharge status: Home / Self Care | Payer: Medicare HMO | Source: Ambulatory Visit | Attending: Cardiology | Admitting: Cardiology

## 2017-06-07 ENCOUNTER — Telehealth (HOSPITAL_COMMUNITY): Payer: Self-pay | Admitting: Unknown Physician Specialty

## 2017-06-07 DIAGNOSIS — Z95811 Presence of heart assist device: Secondary | ICD-10-CM

## 2017-06-07 DIAGNOSIS — Z7901 Long term (current) use of anticoagulants: Secondary | ICD-10-CM | POA: Insufficient documentation

## 2017-06-07 DIAGNOSIS — Z9889 Other specified postprocedural states: Secondary | ICD-10-CM

## 2017-06-07 LAB — CBC
HEMATOCRIT: 25.7 % — AB (ref 39.0–52.0)
Hemoglobin: 8.3 g/dL — ABNORMAL LOW (ref 13.0–17.0)
MCH: 27.4 pg (ref 26.0–34.0)
MCHC: 32.3 g/dL (ref 30.0–36.0)
MCV: 84.8 fL (ref 78.0–100.0)
PLATELETS: 246 10*3/uL (ref 150–400)
RBC: 3.03 MIL/uL — ABNORMAL LOW (ref 4.22–5.81)
RDW: 19.4 % — AB (ref 11.5–15.5)
WBC: 9.1 10*3/uL (ref 4.0–10.5)

## 2017-06-07 LAB — PROTIME-INR
INR: 2.64
Prothrombin Time: 27.9 seconds — ABNORMAL HIGH (ref 11.4–15.2)

## 2017-06-07 NOTE — Progress Notes (Signed)
Patient presents to clinic today for drive line exit wound care. Existing VAD dressing removed and site care performed using sterile technique. Drive line exit site cleaned with Chlora prep applicators x 2, allowed to dry, and Sorbaview dressing with bio patch re-applied. Exit site healed and incorporated, the velour is fully implanted at exit site. No redness, tenderness, drainage, foul odor or rash noted. Drive line anchor re-applied. Pt denies fever or chills.   Return in 1 week for another dressing change per standard of care. INR to be repeated in 1 week pending results per anticoagulation protocol.   Bexley Laubach RN, VAD Coordinator 24/7 pager [phone]  

## 2017-06-07 NOTE — Telephone Encounter (Signed)
Received a call from pts [place] nurse-Bertrice Leder. She is asking for clarification regarding the home health Lasix protocol. Pt was discharged last week with this order and this is new for the pt as we have not done this in the past. [facility] nurse was informed that we are ok with using the [facility] Lasix protocol but [facility] nurse will need to page the [place] before admistering any IV Lasix to the pt per Oda Kilts. [facility] nurse verbalized understanding of this and will comply. We appreciate her help with our pt.

## 2017-06-10 ENCOUNTER — Encounter (HOSPITAL_COMMUNITY): Payer: Self-pay | Admitting: *Deleted

## 2017-06-10 ENCOUNTER — Telehealth (HOSPITAL_COMMUNITY): Payer: Self-pay | Admitting: *Deleted

## 2017-06-10 ENCOUNTER — Other Ambulatory Visit (HOSPITAL_COMMUNITY): Payer: Self-pay | Admitting: Unknown Physician Specialty

## 2017-06-10 ENCOUNTER — Inpatient Hospital Stay (HOSPITAL_COMMUNITY)
Admission: EM | Admit: 2017-06-10 | Discharge: 2017-06-15 | DRG: 312 | Disposition: A | Discharge status: Home Health | Payer: Medicare HMO | Attending: Internal Medicine | Admitting: Internal Medicine

## 2017-06-10 ENCOUNTER — Emergency Department (HOSPITAL_COMMUNITY): Payer: Medicare HMO

## 2017-06-10 DIAGNOSIS — Z95811 Presence of heart assist device: Secondary | ICD-10-CM

## 2017-06-10 DIAGNOSIS — E1122 Type 2 diabetes mellitus with diabetic chronic kidney disease: Secondary | ICD-10-CM | POA: Diagnosis present

## 2017-06-10 DIAGNOSIS — Z7901 Long term (current) use of anticoagulants: Secondary | ICD-10-CM

## 2017-06-10 DIAGNOSIS — I130 Hypertensive heart and chronic kidney disease with heart failure and stage 1 through stage 4 chronic kidney disease, or unspecified chronic kidney disease: Secondary | ICD-10-CM | POA: Diagnosis present

## 2017-06-10 DIAGNOSIS — J449 Chronic obstructive pulmonary disease, unspecified: Secondary | ICD-10-CM | POA: Diagnosis present

## 2017-06-10 DIAGNOSIS — T445X6A Underdosing of predominantly beta-adrenoreceptor agonists, initial encounter: Secondary | ICD-10-CM | POA: Diagnosis present

## 2017-06-10 DIAGNOSIS — Z9889 Other specified postprocedural states: Secondary | ICD-10-CM

## 2017-06-10 DIAGNOSIS — D509 Iron deficiency anemia, unspecified: Secondary | ICD-10-CM | POA: Diagnosis present

## 2017-06-10 DIAGNOSIS — R791 Abnormal coagulation profile: Secondary | ICD-10-CM | POA: Diagnosis present

## 2017-06-10 DIAGNOSIS — I480 Paroxysmal atrial fibrillation: Secondary | ICD-10-CM | POA: Diagnosis present

## 2017-06-10 DIAGNOSIS — Z8249 Family history of ischemic heart disease and other diseases of the circulatory system: Secondary | ICD-10-CM | POA: Diagnosis not present

## 2017-06-10 DIAGNOSIS — N179 Acute kidney failure, unspecified: Secondary | ICD-10-CM | POA: Diagnosis present

## 2017-06-10 DIAGNOSIS — I2510 Atherosclerotic heart disease of native coronary artery without angina pectoris: Secondary | ICD-10-CM | POA: Diagnosis present

## 2017-06-10 DIAGNOSIS — N183 Chronic kidney disease, stage 3 (moderate): Secondary | ICD-10-CM | POA: Diagnosis present

## 2017-06-10 DIAGNOSIS — Z91138 Patient's unintentional underdosing of medication regimen for other reason: Secondary | ICD-10-CM

## 2017-06-10 DIAGNOSIS — I5022 Chronic systolic (congestive) heart failure: Secondary | ICD-10-CM | POA: Diagnosis present

## 2017-06-10 DIAGNOSIS — R55 Syncope and collapse: Principal | ICD-10-CM | POA: Diagnosis present

## 2017-06-10 DIAGNOSIS — Z951 Presence of aortocoronary bypass graft: Secondary | ICD-10-CM | POA: Diagnosis not present

## 2017-06-10 DIAGNOSIS — Z833 Family history of diabetes mellitus: Secondary | ICD-10-CM | POA: Diagnosis not present

## 2017-06-10 DIAGNOSIS — D500 Iron deficiency anemia secondary to blood loss (chronic): Secondary | ICD-10-CM | POA: Diagnosis not present

## 2017-06-10 DIAGNOSIS — Z95810 Presence of automatic (implantable) cardiac defibrillator: Secondary | ICD-10-CM | POA: Diagnosis not present

## 2017-06-10 DIAGNOSIS — Z87891 Personal history of nicotine dependence: Secondary | ICD-10-CM

## 2017-06-10 DIAGNOSIS — R110 Nausea: Secondary | ICD-10-CM

## 2017-06-10 DIAGNOSIS — R531 Weakness: Secondary | ICD-10-CM

## 2017-06-10 DIAGNOSIS — K56609 Unspecified intestinal obstruction, unspecified as to partial versus complete obstruction: Secondary | ICD-10-CM

## 2017-06-10 DIAGNOSIS — K5900 Constipation, unspecified: Secondary | ICD-10-CM | POA: Diagnosis not present

## 2017-06-10 DIAGNOSIS — I5084 End stage heart failure: Secondary | ICD-10-CM | POA: Diagnosis present

## 2017-06-10 DIAGNOSIS — D649 Anemia, unspecified: Secondary | ICD-10-CM | POA: Diagnosis present

## 2017-06-10 DIAGNOSIS — R112 Nausea with vomiting, unspecified: Secondary | ICD-10-CM | POA: Diagnosis present

## 2017-06-10 LAB — COMPREHENSIVE METABOLIC PANEL
ALK PHOS: 76 U/L (ref 38–126)
ALT: 19 U/L (ref 17–63)
AST: 19 U/L (ref 15–41)
Albumin: 3.4 g/dL — ABNORMAL LOW (ref 3.5–5.0)
Anion gap: 9 (ref 5–15)
BUN: 49 mg/dL — AB (ref 6–20)
CALCIUM: 8.3 mg/dL — AB (ref 8.9–10.3)
CHLORIDE: 93 mmol/L — AB (ref 101–111)
CO2: 28 mmol/L (ref 22–32)
CREATININE: 1.82 mg/dL — AB (ref 0.61–1.24)
GFR calc Af Amer: 45 mL/min — ABNORMAL LOW (ref >60)
GFR, EST NON AFRICAN AMERICAN: 39 mL/min — AB (ref >60)
Glucose, Bld: 242 mg/dL — ABNORMAL HIGH (ref 65–99)
Potassium: 4.4 mmol/L (ref 3.5–5.1)
Sodium: 130 mmol/L — ABNORMAL LOW (ref 135–145)
Total Bilirubin: 0.9 mg/dL (ref 0.3–1.2)
Total Protein: 6.7 g/dL (ref 6.5–8.1)

## 2017-06-10 LAB — CBC WITH DIFFERENTIAL/PLATELET
BASOS ABS: 0 10*3/uL (ref 0.0–0.1)
Basophils Relative: 0 %
EOS ABS: 0 10*3/uL (ref 0.0–0.7)
EOS PCT: 0 %
HCT: 23.8 % — ABNORMAL LOW (ref 39.0–52.0)
Hemoglobin: 7.6 g/dL — ABNORMAL LOW (ref 13.0–17.0)
LYMPHS ABS: 0.6 10*3/uL — AB (ref 0.7–4.0)
LYMPHS PCT: 6 %
MCH: 27.1 pg (ref 26.0–34.0)
MCHC: 31.9 g/dL (ref 30.0–36.0)
MCV: 85 fL (ref 78.0–100.0)
MONO ABS: 0.6 10*3/uL (ref 0.1–1.0)
Monocytes Relative: 6 %
Neutro Abs: 9.3 10*3/uL — ABNORMAL HIGH (ref 1.7–7.7)
Neutrophils Relative %: 88 %
PLATELETS: 195 10*3/uL (ref 150–400)
RBC: 2.8 MIL/uL — ABNORMAL LOW (ref 4.22–5.81)
RDW: 18.8 % — AB (ref 11.5–15.5)
WBC: 10.5 10*3/uL (ref 4.0–10.5)

## 2017-06-10 LAB — AMMONIA: Ammonia: 27 umol/L (ref 9–35)

## 2017-06-10 LAB — PROTIME-INR
INR: 1.51
PROTHROMBIN TIME: 18 s — AB (ref 11.4–15.2)

## 2017-06-10 LAB — I-STAT TROPONIN, ED: TROPONIN I, POC: 0.01 ng/mL (ref 0.00–0.08)

## 2017-06-10 LAB — LIPASE, BLOOD: LIPASE: 30 U/L (ref 11–51)

## 2017-06-10 LAB — LACTATE DEHYDROGENASE: LDH: 187 U/L (ref 98–192)

## 2017-06-10 MED ORDER — ADULT MULTIVITAMIN W/MINERALS CH
1.0000 | ORAL_TABLET | Freq: Every day | ORAL | Status: DC
  Administered 2017-06-11 – 2017-06-15 (×5): 1 via ORAL
  Filled 2017-06-10 (×5): qty 1

## 2017-06-10 MED ORDER — AMIODARONE HCL 200 MG PO TABS
200.0000 mg | ORAL_TABLET | Freq: Every day | ORAL | Status: DC
  Administered 2017-06-11 – 2017-06-15 (×5): 200 mg via ORAL
  Filled 2017-06-10 (×5): qty 1

## 2017-06-10 MED ORDER — TRAMADOL HCL 50 MG PO TABS
50.0000 mg | ORAL_TABLET | Freq: Three times a day (TID) | ORAL | Status: DC | PRN
  Administered 2017-06-11 – 2017-06-14 (×6): 50 mg via ORAL
  Filled 2017-06-10 (×6): qty 1

## 2017-06-10 MED ORDER — SUCRALFATE 1 GM/10ML PO SUSP
1.0000 g | Freq: Three times a day (TID) | ORAL | Status: DC
  Administered 2017-06-11 – 2017-06-15 (×19): 1 g via ORAL
  Filled 2017-06-10 (×20): qty 10

## 2017-06-10 MED ORDER — ACETAMINOPHEN 500 MG PO TABS: 1000.0000 mg | ORAL_TABLET | Freq: Four times a day (QID) | ORAL | Status: DC | PRN

## 2017-06-10 MED ORDER — MAGNESIUM OXIDE 400 (241.3 MG) MG PO TABS
400.0000 mg | ORAL_TABLET | Freq: Every day | ORAL | Status: DC
  Administered 2017-06-11 – 2017-06-15 (×5): 400 mg via ORAL
  Filled 2017-06-10 (×5): qty 1

## 2017-06-10 MED ORDER — INSULIN ASPART 100 UNIT/ML ~~LOC~~ SOLN
0.0000 [IU] | Freq: Every day | SUBCUTANEOUS | Status: DC
  Administered 2017-06-11 (×2): 2 [IU] via SUBCUTANEOUS

## 2017-06-10 MED ORDER — ONDANSETRON HCL 4 MG/2ML IJ SOLN
4.0000 mg | Freq: Four times a day (QID) | INTRAMUSCULAR | Status: DC | PRN
  Administered 2017-06-11 – 2017-06-14 (×3): 4 mg via INTRAVENOUS
  Filled 2017-06-10 (×3): qty 2

## 2017-06-10 MED ORDER — INSULIN ASPART 100 UNIT/ML ~~LOC~~ SOLN
0.0000 [IU] | Freq: Three times a day (TID) | SUBCUTANEOUS | Status: DC
  Administered 2017-06-11: 5 [IU] via SUBCUTANEOUS
  Administered 2017-06-11 – 2017-06-12 (×5): 3 [IU] via SUBCUTANEOUS
  Administered 2017-06-13: 6 [IU] via SUBCUTANEOUS
  Administered 2017-06-13: 3 [IU] via SUBCUTANEOUS
  Administered 2017-06-13 – 2017-06-15 (×4): 2 [IU] via SUBCUTANEOUS

## 2017-06-10 MED ORDER — METOCLOPRAMIDE HCL 5 MG/ML IJ SOLN
10.0000 mg | Freq: Once | INTRAMUSCULAR | Status: AC
  Administered 2017-06-10: 10 mg via INTRAVENOUS
  Filled 2017-06-10: qty 2

## 2017-06-10 MED ORDER — DIGOXIN 125 MCG PO TABS
0.1250 mg | ORAL_TABLET | Freq: Every day | ORAL | Status: DC
  Administered 2017-06-11 – 2017-06-15 (×5): 0.125 mg via ORAL
  Filled 2017-06-10 (×5): qty 1

## 2017-06-10 MED ORDER — INSULIN GLARGINE 100 UNIT/ML ~~LOC~~ SOLN
10.0000 [IU] | Freq: Every day | SUBCUTANEOUS | Status: DC
  Administered 2017-06-11 – 2017-06-14 (×5): 10 [IU] via SUBCUTANEOUS
  Filled 2017-06-10 (×6): qty 0.1

## 2017-06-10 MED ORDER — GABAPENTIN 100 MG PO CAPS
200.0000 mg | ORAL_CAPSULE | Freq: Two times a day (BID) | ORAL | Status: DC
  Administered 2017-06-11 – 2017-06-15 (×10): 200 mg via ORAL
  Filled 2017-06-10 (×10): qty 2

## 2017-06-10 MED ORDER — INSULIN ASPART 100 UNIT/ML ~~LOC~~ SOLN
6.0000 [IU] | Freq: Three times a day (TID) | SUBCUTANEOUS | Status: DC
  Administered 2017-06-11 – 2017-06-15 (×15): 6 [IU] via SUBCUTANEOUS

## 2017-06-10 MED ORDER — CITALOPRAM HYDROBROMIDE 20 MG PO TABS
20.0000 mg | ORAL_TABLET | Freq: Every day | ORAL | Status: DC
  Administered 2017-06-11 – 2017-06-15 (×5): 20 mg via ORAL
  Filled 2017-06-10 (×5): qty 1

## 2017-06-10 MED ORDER — PANTOPRAZOLE SODIUM 40 MG PO TBEC
40.0000 mg | DELAYED_RELEASE_TABLET | Freq: Two times a day (BID) | ORAL | Status: DC
  Administered 2017-06-11 – 2017-06-15 (×10): 40 mg via ORAL
  Filled 2017-06-10 (×10): qty 1

## 2017-06-10 MED ORDER — ZOLPIDEM TARTRATE 5 MG PO TABS
10.0000 mg | ORAL_TABLET | Freq: Every evening | ORAL | Status: DC | PRN
  Administered 2017-06-11: 10 mg via ORAL
  Filled 2017-06-10 (×2): qty 2

## 2017-06-10 MED ORDER — DOCUSATE SODIUM 100 MG PO CAPS
200.0000 mg | ORAL_CAPSULE | Freq: Every day | ORAL | Status: DC
  Administered 2017-06-11 – 2017-06-14 (×4): 200 mg via ORAL
  Filled 2017-06-10 (×4): qty 2

## 2017-06-10 MED ORDER — DOBUTAMINE IN D5W 4-5 MG/ML-% IV SOLN
7.5000 ug/kg/min | INTRAVENOUS | Status: DC
  Administered 2017-06-11 – 2017-06-14 (×5): 7.5 ug/kg/min via INTRAVENOUS
  Filled 2017-06-10 (×5): qty 250

## 2017-06-10 MED ORDER — SODIUM CHLORIDE 0.9 % IV SOLN: Freq: Once | INTRAVENOUS | Status: DC

## 2017-06-10 MED ORDER — ACETAMINOPHEN 325 MG PO TABS
650.0000 mg | ORAL_TABLET | ORAL | Status: DC | PRN
  Administered 2017-06-11: 650 mg via ORAL
  Filled 2017-06-10: qty 2

## 2017-06-10 NOTE — ED Notes (Signed)
Report given to rn on 2c 

## 2017-06-10 NOTE — ED Triage Notes (Signed)
Pt feelikng  Dizzy and nauseated while out in thge sun poumping  Gas still has nausea.  Brought in by [facility] iv lt wrist  Given zofran 4mg  iv.  Alert oriented skin waerm an dery  He has a headac he and has a headache  And lioght sensitive

## 2017-06-10 NOTE — ED Notes (Signed)
The pt is c/o neck pain still  He reports that he feels better

## 2017-06-10 NOTE — ED Notes (Signed)
Pt is lvad and rapid response has been called the team was  Called by [place] [facility] before the pt arrived

## 2017-06-10 NOTE — Telephone Encounter (Signed)
[facility] paged VAD coordinator to report they had been called for VAD patient. EMS picked pt up at gas station and report he is pale, diaphoretic, and confused. MAP with automatic cuff is 62, VAD parameters per pt is flow 4.8, speed 5900, PI 2.8. BS has been checked and reported as 260. Pt now c/o of nausea and vomiting.  Instructed to give 200 cc NS bolus per Dr. Haroldine Laws. Pt is being transported to [facility] ED; ETA 25 min.    Notified ED charge nurse, asked for LDH, CBC, and INR to be obtained along with any other routine labs; Dr. Haroldine Laws is on call, asked ED charge nurse to notify him when pt arrives.  Notified rapid response of patients ETA; will need [facility] VAD cart. Asked her to notify VAD coordinator if any questions, concerns.

## 2017-06-10 NOTE — ED Notes (Signed)
Pt still nauseated  reglan given iv  More blood drawn

## 2017-06-10 NOTE — ED Notes (Signed)
Dr Zoila Shutter here
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
## 2017-06-10 NOTE — H&P (Signed)
Advanced Heart Failure Team History and Physical Note  Primary Cardiologist:  Dr. Haroldine Laws  Reason for Admission: nausea, weakness.    HPI:    Ronald Miller. is a 61 y.o. male male with history of chronic systolic HF s/p Medtronic ICD 2014, CAD s/p CABG in 2000, HTN, Hx of cocaine abuse, Tobacco abuse, Depression, PAF, and COPD. HM3 placed 09/09/2016.   Admitted 04/29/17-05/09/17 with melena and recurrent HF. EGD showed ulcers in stomach. H pylori negative. Diuresed. Dobutamine increased from 6 to 7.5 due to low output.   Admitted 05/31/17-06/03/17 with melena and planned inpatient procedure for capsule endoscopy. Capsule endoscopy showed 1. Mild antral gastritis -- no active bleeding 2. Normal small bowel -- no pathology seen. No angiectasiasseen. No evidence of active or recent bleeding 3. Minimal views in the right colon and secured by green/brown stool 4. Possible, tiny angiectasia, nonbleeding in the right colon. He was discharged on a lower dose of losartan due to low MAP's.   He remains on home dobutamine. He presents today to the ED with weakness and near syncope. Says he has been feeling great recently Taking all meds. . He was outside today pumping gas and felt nauseous and dizzy. Also became acutely weak and confused. EMS was called and he was brought to the ED. On arrival was oriented but nauseated. No focal weakness. Hemoglobin down but denies any recent melena. Denies palpitations. Initial MAP 66 now up to 72. Treated with Reglan.  On my arrival says he feels fine. Wants to go home. His fanny pack with dobutamine is not present in the ER here. He says he has been using it at home.  HM-III LVAD  Flow 5.4 Speed 5900 Power 4.5 PI 2.2 Multiple PI events since yesterday. Numerous power cable disconnected alarms  Review of Systems: [y] = yes, [ ]  = no   General: Weight gain [ ] ; Weight loss [ ] ; Anorexia [ ] ; Fatigue [name] ]; Fever [ ] ; Chills [ ] ; Weakness [name] ]  Cardiac:  Chest pain/pressure [ ] ; Resting SOB [ ] ; Exertional SOB [ ] ; Orthopnea [ ] ; Pedal Edema [ ] ; Palpitations [ ] ; Syncope [ ] ; Presyncope [name] ]; Paroxysmal nocturnal dyspnea[ ]   Pulmonary: Cough [ ] ; Wheezing[ ] ; Hemoptysis[ ] ; Sputum [ ] ; Snoring [ ]   GI: Vomiting[ ] ; Dysphagia[ ] ; Melena[ ] ; Hematochezia [ ] ; Heartburn[ ] ; Abdominal pain [ ] ; Constipation [ ] ; Diarrhea [ ] ; BRBPR [ ]   GU: Hematuria[ ] ; Dysuria [ ] ; Nocturia[ ]   Vascular: Pain in legs with walking [ ] ; Pain in feet with lying flat [ ] ; Non-healing sores [ ] ; Stroke [ ] ; TIA [ ] ; Slurred speech [ ] ;  Neuro: Headaches[ ] ; Vertigo[ ] ; Seizures[ ] ; Paresthesias[ ] ;Blurred vision [ ] ; Diplopia [ ] ; Vision changes [ ]   Ortho/Skin: Arthritis [name] ]; Joint pain [ ] ; Muscle pain [ ] ; Joint swelling [ ] ; Back Pain [ ] ; Rash [ ]   Psych: Depression[ ] ; Anxiety[ ]   Heme: Bleeding problems [ ] ; Clotting disorders [ ] ; Anemia [ ]   Endocrine: Diabetes [ ] ; Thyroid dysfunction[ ]    Home Medications Prior to Admission medications   Medication Sig Start Date End Date Taking? Authorizing Provider  acetaminophen (TYLENOL) 500 MG tablet Take 1,000 mg by mouth every 6 (six) hours as needed for moderate pain or headache.    [provider]  amiodarone (PACERONE) 200 MG tablet Take 1 tablet (200 mg total) by mouth daily. 05/09/17   Ninfa Meeker, Amy  D, NP  citalopram (CELEXA) 20 MG tablet Take 1 tablet (20 mg total) by mouth daily. 05/09/17   Clegg, Amy D, NP  digoxin (DIGOX) 0.125 MG tablet Take 1 tablet (0.125 mg total) by mouth daily. 05/09/17   Clegg, Amy D, NP  DOBUTamine (DOBUTREX) 4-5 MG/ML-% infusion Inject 571.8 mcg/min into the vein continuous. PER [facility] 05/20/17   Jeniffer Culliver, Shaune Pascal, MD  docusate sodium (COLACE) 100 MG capsule Take 200 mg by mouth at bedtime.     [provider]  gabapentin (NEURONTIN) 100 MG capsule Take 2 capsules (200 mg total) by mouth 2 (two) times daily. 05/09/17   Clegg, Amy D, NP  insulin aspart (NOVOLOG  FLEXPEN) 100 UNIT/ML FlexPen Inject 5 Units into the skin 3 (three) times daily with meals. Patient taking differently: Inject 6 Units into the skin 3 (three) times daily with meals.  05/09/17   Clegg, Amy D, NP  Insulin Glargine (LANTUS SOLOSTAR) 100 UNIT/ML Solostar Pen Inject 20 Units into the skin daily at 10 pm. Patient taking differently: Inject 10 Units into the skin daily at 10 pm.  05/09/17   Clegg, Amy D, NP  losartan (COZAAR) 25 MG tablet Take 0.5 tablets (12.5 mg total) by mouth daily. 06/03/17   [name] Friar, PA-C  magnesium oxide (MAG-OX) 400 (241.3 Mg) MG tablet Take 1 tablet (400 mg total) by mouth daily. 05/09/17   Clegg, Amy D, NP  Multiple Vitamin (MULTIVITAMIN WITH MINERALS) TABS tablet Take 1 tablet by mouth daily.    [provider]  ondansetron (ZOFRAN ODT) 4 MG disintegrating tablet Take 1 tablet (4 mg total) by mouth every 8 (eight) hours as needed for nausea. 04/10/17   Tanna Furry, MD  pantoprazole (PROTONIX) 40 MG tablet Take 1 tablet (40 mg total) by mouth 2 (two) times daily before a meal. 05/09/17   Clegg, Amy D, NP  potassium chloride SA (K-DUR,KLOR-CON) 20 MEQ tablet Take 2 tablets (40 mEq total) by mouth 2 (two) times daily. 05/09/17   Clegg, Amy D, NP  senna-docusate (SENOKOT S) 8.6-50 MG tablet Take 2 tablets by mouth at bedtime as needed for mild constipation. 05/09/17   Clegg, Amy D, NP  sildenafil (REVATIO) 20 MG tablet Take 2 tablets (40 mg total) by mouth 3 (three) times daily. 05/09/17   Clegg, Amy D, NP  spironolactone (ALDACTONE) 25 MG tablet Take 1 tablet (25 mg total) by mouth at bedtime. 06/03/17   [name] Friar, PA-C  sucralfate (CARAFATE) 1 GM/10ML suspension Take 10 mLs (1 g total) by mouth 4 (four) times daily -  with meals and at bedtime. 05/09/17   Darrick Grinder D, NP  torsemide (DEMADEX) 100 MG tablet Take 100 mg in the morning and 50 mg in the evening 05/19/17   Livy Ross, Shaune Pascal, MD  traMADol (ULTRAM) 50 MG tablet Take 1 tablet (50 mg  total) by mouth 3 (three) times daily as needed for severe pain. 04/15/17   Clegg, Amy D, NP  warfarin (COUMADIN) 5 MG tablet Take 10 mg (2 tablets) daily except 5 mg (1 tablet) on Tuesday    [provider]  zolpidem (AMBIEN) 10 MG tablet Take 1 tablet (10 mg total) by mouth at bedtime as needed for sleep. Patient taking differently: Take 10 mg by mouth at bedtime.  05/24/17   Elliemae Braman, Shaune Pascal, MD    Past Medical History: Past Medical History:  Diagnosis Date  . AICD (automatic cardioverter/defibrillator) present   . ASCVD (arteriosclerotic cardiovascular  disease)   . Chronic systolic CHF (congestive heart failure) ([place])   . COPD (chronic obstructive pulmonary disease) ([place])   . Coronary artery disease   . Depression   . Diabetes mellitus   . History of cocaine abuse   . Hypertension   . Presence of permanent cardiac pacemaker   . Shortness of breath dyspnea   . Suicidal ideation   . Tobacco abuse     Past Surgical History: Past Surgical History:  Procedure Laterality Date  . CARDIAC CATHETERIZATION N/A 02/12/2016   Procedure: Right/Left Heart Cath and Coronary/Graft Angiography;  Surgeon: Jolaine Artist, MD;  Location: [place] [facility];  Service: Cardiovascular;  Laterality: N/A;  . CARDIAC CATHETERIZATION N/A 03/22/2016   Procedure: Right Heart Cath;  Surgeon: Jolaine Artist, MD;  Location: [place] [facility];  Service: Cardiovascular;  Laterality: N/A;  . CARDIAC CATHETERIZATION N/A 08/25/2016   Procedure: Right Heart Cath;  Surgeon: Jolaine Artist, MD;  Location: [place] [facility];  Service: Cardiovascular;  Laterality: N/A;  . CARDIAC CATHETERIZATION N/A 09/03/2016   Procedure: Right Heart Cath;  Surgeon: Jolaine Artist, MD;  Location: [place] [facility];  Service: Cardiovascular;  Laterality: N/A;  . CARDIAC DEFIBRILLATOR PLACEMENT    . CARDIAC DEFIBRILLATOR PLACEMENT    . CHOLECYSTECTOMY N/A 08/30/2015   Procedure: LAPAROSCOPIC CHOLECYSTECTOMY;   Surgeon: Hubbard Robinson, MD;  Location: [name];  Service: General;  Laterality: N/A;  . COLONOSCOPY WITH PROPOFOL N/A 08/18/2016   Procedure: COLONOSCOPY WITH PROPOFOL;  Surgeon: Jerene Bears, MD;  Location: [place];  Service: Gastroenterology;  Laterality: N/A;  . CORONARY ARTERY BYPASS GRAFT  2001  . ENTEROSCOPY N/A 05/02/2017   Procedure: ENTEROSCOPY;  Surgeon: Mauri Pole, MD;  Location: [facility];  Service: Endoscopy;  Laterality: N/A;  . GIVENS CAPSULE STUDY N/A 06/01/2017   Procedure: GIVENS CAPSULE STUDY;  Surgeon: Jerene Bears, MD;  Location: [place];  Service: Gastroenterology;  Laterality: N/A;  . INSERT / REPLACE / REMOVE PACEMAKER    . INSERTION OF IMPLANTABLE LEFT VENTRICULAR ASSIST DEVICE N/A 09/09/2016   Procedure: INSERTION OF IMPLANTABLE LEFT VENTRICULAR ASSIST DEVICE;  Surgeon: [name], MD;  Location: [place];  Service: Open Heart Surgery;  Laterality: N/A;  HeartMate 3 CIRC ARREST  NITRIC OXIDE  . IR FLUORO GUIDE CV LINE RIGHT  01/28/2017  . IR GENERIC HISTORICAL  08/03/2016   IR FLUORO GUIDE CV LINE RIGHT 08/03/2016 [name], MD [facility]  . IR GENERIC HISTORICAL  12/14/2016   IR FLUORO GUIDE CV LINE RIGHT 12/14/2016 [name], MD [facility]  . IR US GUIDE VASC ACCESS RIGHT  01/28/2017  . LUMBAR DISC SURGERY  02/1999   Discectomy and fusion  . RIGHT HEART CATH N/A 02/11/2017   Procedure: Right Heart Cath;  Surgeon: Jolaine Artist, MD;  Location: [place] [facility];  Service: Cardiovascular;  Laterality: N/A;  . TEE WITHOUT CARDIOVERSION N/A 09/09/2016   Procedure: TRANSESOPHAGEAL ECHOCARDIOGRAM (TEE);  Surgeon: [name], MD;  Location: [place];  Service: Open Heart Surgery;  Laterality: N/A;  . VASECTOMY     Subsequent reversal    Family History:  Family History  Problem Relation Age of Onset  . CAD Father   . Diabetes Father   . Heart failure Father     Social History: Social History   Social History    . Marital status: Divorced    Spouse name: N/A  . Number of  children: 1  . Years of education: 43   Occupational History  . Retired   . Truck driver    Social History Main Topics  . Smoking status: Former Smoker    Packs/day: 1.50    Years: 23.00    Types: Cigarettes    Start date: 10/24/1992    Quit date: 07/01/2016  . Smokeless tobacco: Never Used  . Alcohol use No  . Drug use: Yes    Frequency: 0.1 times per week    Types: Cocaine     Comment: 07/01/2016 last use relapse after 8 yrs  . Sexual activity: Not Currently   Other Topics Concern  . None   Social History Narrative   Divorced with one child   No regular exercise    Allergies:  Allergies  Allergen Reactions  . Codeine Nausea And Vomiting and Other (See Comments)    In high doses  . Trazodone And Nefazodone Other (See Comments)    Dizziness with subsequent fall   . Lipitor [Atorvastatin] Nausea Only and Other (See Comments)    Nausea with high doses, tolerates 20mg  dose (08/22/16)  . Tape Other (See Comments)    Paper tape is ok    Objective:    Vital Signs:   Temp:  [97.4 F (36.3 C)] 97.4 F (36.3 C) (09/07 1928) Pulse Rate:  [60] 60 (09/07 1929) Resp:  [21] 21 (09/07 1929) BP: (68-72)/(0) 72/0 (09/07 2036) SpO2:  [97 %] 97 % (09/07 1929)   There were no vitals filed for this visit.   Physical Exam     General:  Lying in bed. Pale. No respiratory difficulty HEENT: Normal Neck: Supple. 6-7 cm JVD. Carotids 2+ bilat; no bruits. No lymphadenopathy or thyromegaly appreciated. Cor: mechanical heart sounds with LVAD hum present.  Lungs: Clear, normal effort.  Abdomen: Soft, nontender, nondistended. No hepatosplenomegaly. No bruits or masses. Good bowel sounds. LAVD exit site: well healed and incorporated. Dressing dry and intact. No erythema or drainage. Stabilization device present and accurately applied.  Extremities: No cyanosis, clubbing, rash, edema. Warm and dry.  Neuro: Alert & oriented  x 3, cranial nerves grossly intact. moves all 4 extremities w/o difficulty. Affect pleasant.   Telemetry   NSR - personally reviewed.   EKG   n/a  Labs     Basic Metabolic Panel:  Recent Labs Lab 06/05/17 1940 06/10/17 1940  NA 131* 130*  K 5.0 4.4  CL 91* 93*  CO2 29 28  GLUCOSE 212* 242*  BUN 51* 49*  CREATININE 1.60* 1.82*  CALCIUM 9.0 8.3*    Liver Function Tests:  Recent Labs Lab 06/10/17 1940  AST 19  ALT 19  ALKPHOS 76  BILITOT 0.9  PROT 6.7  ALBUMIN 3.4*    Recent Labs Lab 06/10/17 1940  LIPASE 30    Recent Labs Lab 06/10/17 2010  AMMONIA 27    CBC:  Recent Labs Lab 06/05/17 1940 06/07/17 1136 06/10/17 1940  WBC 7.9 9.1 10.5  NEUTROABS 6.5  --  9.3*  HGB 8.8* 8.3* 7.6*  HCT 26.2* 25.7* 23.8*  MCV 84.4 84.8 85.0  PLT 256 246 195    Cardiac Enzymes: No results for input(s): CKTOTAL, CKMB, CKMBINDEX, TROPONINI in the last 168 hours.  BNP: BNP (last 3 results)  Recent Labs  10/13/16 1207 11/30/16 1158 01/21/17 1230  BNP 321.5* 565.5* 649.9*    ProBNP (last 3 results) No results for input(s): PROBNP in the last 8760 hours.   CBG: No results  for input(s): GLUCAP in the last 168 hours.  Coagulation Studies:  Recent Labs  06/10/17 1940  LABPROT 18.0*  INR 1.51    Imaging: Dg Chest Portable 1 View  Result Date: 06/10/2017 CLINICAL DATA:  Patient with dizziness and nausea. EXAM: PORTABLE CHEST 1 VIEW COMPARISON:  Chest radiograph 04/30/2017 FINDINGS: Central venous catheter tip projects over the superior cavoatrial junction. Multi lead AICD device overlies the left hemithorax. LVAD device. Stable cardiomegaly. Probable small left pleural effusion and left basilar opacities. No pneumothorax. IMPRESSION: Probable small left pleural effusion and left basilar atelectasis. Cardiomegaly. CVC tip projects over the superior cavoatrial junction. Electronically Signed   By: Lovey Newcomer M.D.   On: 06/10/2017 19:55      Patient Profile   Ronald Miller.is a 61 y.o.male with history of chronic systolic HF s/p Medtronic ICD 2014, CAD s/p CABG in 2000, HTN, Hx of cocaine abuse, Tobacco abuse, Depression, PAF, and COPD. HM3 placed 09/09/2016. Admitted with weakness, nausea and pre-syncope.    Assessment/Plan   1. Nausea/Weakness/pre-syncope - unclear etiology. Now resolved. ? Dysrhythmia - Will interrogate ICD  2. Chronic end stage biventricular systolic HF: LVEF 17%, ICM. S/p HM3 LVAD 61/03/736.  - Complicated by RV failure - Now on dobutamine 7.5 mcg at home however he is disconnected currently in ER. Unclear how long that has been. Will resume - Appears dry on exam with multiple PI events and low PI on monitor. Hold diuretics for now - Has been turned down by [facility] for transplant.  - No beta blocker with RV failure. - Continue digoxin 0.125 mcg  3. LVAD - PI low with multi PI events and power disconnects - Suspect volume depletion +/- GI bleed with low hgb.  - INR low at 1.5. Start heparin/coumadin per pharmD - LDH ok   4. Acute on chronic renal failure, stage 3 - hold diuretics  5. Iron deficiency anemia - has h/o acute gastritis on EGD. Recent capsule normal - transfuse 1u RBCS. Check iron stores   6. CAD: Severe 3 vessel CAD s/p CABG with occluded grafts except for LIMA.  - Denies chest pain.   7. DM - SSI - Continue lantus hs  8. PAF - In NSR. Continue Annett Gula, MD 06/10/2017, 9:39 PM  Advanced Heart Failure Team Pager [phone] (M-F; [place])  Please contact [facility] for night-coverage after hours (4p -7a ) and weekends on [email or web address]

## 2017-06-11 DIAGNOSIS — D500 Iron deficiency anemia secondary to blood loss (chronic): Secondary | ICD-10-CM

## 2017-06-11 LAB — CBC
HEMATOCRIT: 24.4 % — AB (ref 39.0–52.0)
HEMOGLOBIN: 7.7 g/dL — AB (ref 13.0–17.0)
MCH: 27.3 pg (ref 26.0–34.0)
MCHC: 31.6 g/dL (ref 30.0–36.0)
MCV: 86.5 fL (ref 78.0–100.0)
Platelets: 171 10*3/uL (ref 150–400)
RBC: 2.82 MIL/uL — AB (ref 4.22–5.81)
RDW: 17.9 % — ABNORMAL HIGH (ref 11.5–15.5)
WBC: 8.1 10*3/uL (ref 4.0–10.5)

## 2017-06-11 LAB — PROTIME-INR
INR: 1.36
PROTHROMBIN TIME: 16.6 s — AB (ref 11.4–15.2)

## 2017-06-11 LAB — BASIC METABOLIC PANEL
ANION GAP: 7 (ref 5–15)
BUN: 40 mg/dL — ABNORMAL HIGH (ref 6–20)
CHLORIDE: 92 mmol/L — AB (ref 101–111)
CO2: 30 mmol/L (ref 22–32)
Calcium: 7.5 mg/dL — ABNORMAL LOW (ref 8.9–10.3)
Creatinine, Ser: 1.54 mg/dL — ABNORMAL HIGH (ref 0.61–1.24)
GFR calc non Af Amer: 47 mL/min — ABNORMAL LOW (ref >60)
GFR, EST AFRICAN AMERICAN: 55 mL/min — AB (ref >60)
Glucose, Bld: 161 mg/dL — ABNORMAL HIGH (ref 65–99)
Potassium: 4.5 mmol/L (ref 3.5–5.1)
Sodium: 129 mmol/L — ABNORMAL LOW (ref 135–145)

## 2017-06-11 LAB — HEPARIN LEVEL (UNFRACTIONATED)
Heparin Unfractionated: 0.25 IU/mL — ABNORMAL LOW (ref 0.30–0.70)
Heparin Unfractionated: 0.44 IU/mL (ref 0.30–0.70)
Heparin Unfractionated: 0.55 IU/mL (ref 0.30–0.70)

## 2017-06-11 LAB — COOXEMETRY PANEL
CARBOXYHEMOGLOBIN: 2.4 % — AB (ref 0.5–1.5)
METHEMOGLOBIN: 0.9 % (ref 0.0–1.5)
O2 Saturation: 63.3 %
Total hemoglobin: 7.9 g/dL — ABNORMAL LOW (ref 12.0–16.0)

## 2017-06-11 LAB — GLUCOSE, CAPILLARY
GLUCOSE-CAPILLARY: 237 mg/dL — AB (ref 65–99)
GLUCOSE-CAPILLARY: 151 mg/dL — AB (ref 65–99)
Glucose-Capillary: 216 mg/dL — ABNORMAL HIGH (ref 65–99)
Glucose-Capillary: 182 mg/dL — ABNORMAL HIGH (ref 65–99)
Glucose-Capillary: 202 mg/dL — ABNORMAL HIGH (ref 65–99)

## 2017-06-11 LAB — RAPID URINE DRUG SCREEN, HOSP PERFORMED
AMPHETAMINES: NOT DETECTED
BENZODIAZEPINES: NOT DETECTED
Barbiturates: NOT DETECTED
Cocaine: NOT DETECTED
OPIATES: NOT DETECTED
Tetrahydrocannabinol: NOT DETECTED

## 2017-06-11 LAB — LACTATE DEHYDROGENASE: LDH: 180 U/L (ref 98–192)

## 2017-06-11 LAB — PREPARE RBC (CROSSMATCH)

## 2017-06-11 MED ORDER — SODIUM CHLORIDE 0.9% FLUSH: 10.0000 mL | INTRAVENOUS | Status: DC | PRN

## 2017-06-11 MED ORDER — SODIUM CHLORIDE 0.9 % IV SOLN
Freq: Once | INTRAVENOUS | Status: AC
  Administered 2017-06-11: 10 mL/h via INTRAVENOUS

## 2017-06-11 MED ORDER — WARFARIN - PHARMACIST DOSING INPATIENT
Freq: Every day | Status: DC
  Administered 2017-06-13 – 2017-06-15 (×3)

## 2017-06-11 MED ORDER — HEPARIN (PORCINE) IN NACL 100-0.45 UNIT/ML-% IJ SOLN
2000.0000 [IU]/h | INTRAMUSCULAR | Status: DC
  Administered 2017-06-11: 1900 [IU]/h via INTRAVENOUS
  Administered 2017-06-11: 2000 [IU]/h via INTRAVENOUS
  Administered 2017-06-12 – 2017-06-13 (×3): 1950 [IU]/h via INTRAVENOUS
  Administered 2017-06-14 (×2): 2000 [IU]/h via INTRAVENOUS
  Filled 2017-06-11 (×8): qty 250

## 2017-06-11 MED ORDER — WARFARIN SODIUM 2.5 MG PO TABS
12.5000 mg | ORAL_TABLET | Freq: Once | ORAL | Status: AC
  Administered 2017-06-11: 18:00:00 12.5 mg via ORAL
  Filled 2017-06-11: qty 1

## 2017-06-11 MED ORDER — SODIUM CHLORIDE 0.9% FLUSH
10.0000 mL | Freq: Two times a day (BID) | INTRAVENOUS | Status: DC
  Administered 2017-06-11 (×2): 10 mL
  Administered 2017-06-11: 40 mL
  Administered 2017-06-12 – 2017-06-15 (×6): 10 mL

## 2017-06-11 NOTE — Progress Notes (Addendum)
ANTICOAGULATION CONSULT NOTE - [place] for heparin and warfarin Indication: LVAD  Allergies  Allergen Reactions  . Codeine Nausea And Vomiting and Other (See Comments)    In high doses  . Trazodone And Nefazodone Other (See Comments)    Dizziness with subsequent fall   . Lipitor [Atorvastatin] Nausea Only and Other (See Comments)    Nausea with high doses, tolerates 20mg  dose (08/22/16)  . Tape Other (See Comments)    Paper tape is ok    Vital Signs: Temp: 97.8 F (36.6 C) (09/08 2007) Temp Source: Oral (09/08 2007) BP: 90/74 (09/08 1615) Pulse Rate: 63 (09/08 2007)  Labs:  Recent Labs  06/10/17 1940 06/11/17 0730 06/11/17 0900 06/11/17 1615 06/11/17 2240  HGB 7.6* 7.7*  --   --   --   HCT 23.8* 24.4*  --   --   --   PLT 195 171  --   --   --   LABPROT 18.0*  --  16.6*  --   --   INR 1.51  --  1.36  --   --   HEPARINUNFRC  --   --  0.25* 0.44 0.55  CREATININE 1.82* 1.54*  --   --   --     Estimated Creatinine Clearance: 59.1 mL/min (A) (by C-G formula based on SCr of 1.54 mg/dL (H)).   Medical History: Past Medical History:  Diagnosis Date  . AICD (automatic cardioverter/defibrillator) present   . ASCVD (arteriosclerotic cardiovascular disease)   . Chronic systolic CHF (congestive heart failure) ([place])   . COPD (chronic obstructive pulmonary disease) ([place])   . Coronary artery disease   . Depression   . Diabetes mellitus   . History of cocaine abuse   . Hypertension   . Presence of permanent cardiac pacemaker   . Shortness of breath dyspnea   . Suicidal ideation   . Tobacco abuse     Medications:  Prescriptions Prior to Admission  Medication Sig Dispense Refill Last Dose  . acetaminophen (TYLENOL) 500 MG tablet Take 1,000 mg by mouth every 6 (six) hours as needed for moderate pain or headache.   Past Week at PRN  . amiodarone (PACERONE) 200 MG tablet Take 1 tablet (200 mg total) by mouth daily. 30 tablet 6 06/10/2017 at  Unknown time  . citalopram (CELEXA) 20 MG tablet Take 1 tablet (20 mg total) by mouth daily. 30 tablet 6 06/10/2017 at Unknown time  . digoxin (DIGOX) 0.125 MG tablet Take 1 tablet (0.125 mg total) by mouth daily. 30 tablet 6 06/10/2017 at 0900  . DOBUTamine (DOBUTREX) 4-5 MG/ML-% infusion Inject 571.8 mcg/min into the vein continuous. PER [facility] pharmacy   06/10/2017 at Unknown time  . docusate sodium (COLACE) 100 MG capsule Take 200 mg by mouth at bedtime.    06/09/2017 at Unknown time  . gabapentin (NEURONTIN) 100 MG capsule Take 2 capsules (200 mg total) by mouth 2 (two) times daily. 120 capsule 3 06/10/2017 at Unknown time  . insulin aspart (NOVOLOG FLEXPEN) 100 UNIT/ML FlexPen Inject 5 Units into the skin 3 (three) times daily with meals. 15 mL 11 06/10/2017 at Unknown time  . Insulin Glargine (LANTUS SOLOSTAR) 100 UNIT/ML Solostar Pen Inject 20 Units into the skin daily at 10 pm. (Patient taking differently: Inject 10 Units into the skin daily at 10 pm. ) 15 mL 11 06/09/2017 at Unknown time  . losartan (COZAAR) 25 MG tablet Take 0.5 tablets (12.5 mg total) by mouth daily.  30 tablet 3 06/10/2017 at Unknown time  . magnesium oxide (MAG-OX) 400 (241.3 Mg) MG tablet Take 1 tablet (400 mg total) by mouth daily. 30 tablet 6 06/10/2017 at Unknown time  . Multiple Vitamin (MULTIVITAMIN WITH MINERALS) TABS tablet Take 1 tablet by mouth daily.   06/10/2017 at Unknown time  . ondansetron (ZOFRAN ODT) 4 MG disintegrating tablet Take 1 tablet (4 mg total) by mouth every 8 (eight) hours as needed for nausea. 6 tablet 0 unknown at PRN  . pantoprazole (PROTONIX) 40 MG tablet Take 1 tablet (40 mg total) by mouth 2 (two) times daily before a meal. 60 tablet 6 06/10/2017 at Unknown time  . potassium chloride SA (K-DUR,KLOR-CON) 20 MEQ tablet Take 2 tablets (40 mEq total) by mouth 2 (two) times daily. 120 tablet 6 06/10/2017 at Unknown time  . senna-docusate (SENOKOT S) 8.6-50 MG tablet Take 2 tablets by mouth at bedtime as needed for mild  constipation. 30 tablet 6 unknown at prn  . sildenafil (REVATIO) 20 MG tablet Take 2 tablets (40 mg total) by mouth 3 (three) times daily. (Patient taking differently: Take 40 mg by mouth 2 (two) times daily. ) 120 tablet 6 06/10/2017  . spironolactone (ALDACTONE) 25 MG tablet Take 1 tablet (25 mg total) by mouth at bedtime. 30 tablet 6 06/09/2017 at Unknown time  . sucralfate (CARAFATE) 1 GM/10ML suspension Take 10 mLs (1 g total) by mouth 4 (four) times daily -  with meals and at bedtime. 420 mL 6 06/10/2017 at Unknown time  . torsemide (DEMADEX) 100 MG tablet Take 100 mg in the morning and 50 mg in the evening 60 tablet 6 06/10/2017 at Unknown time  . traMADol (ULTRAM) 50 MG tablet Take 1 tablet (50 mg total) by mouth 3 (three) times daily as needed for severe pain. 30 tablet 0 unknown at prn  . warfarin (COUMADIN) 5 MG tablet Take 5-10 mg by mouth at bedtime. Take 10 mg (2 tablets) daily except 5 mg (1 tablet) on Tuesday   06/10/2017 at Unknown time  . zolpidem (AMBIEN) 10 MG tablet Take 1 tablet (10 mg total) by mouth at bedtime as needed for sleep. (Patient taking differently: Take 10 mg by mouth at bedtime. ) 30 tablet 2 Past Week at PRN   Scheduled:  . amiodarone  200 mg Oral Daily  . citalopram  20 mg Oral Daily  . digoxin  0.125 mg Oral Daily  . docusate sodium  200 mg Oral QHS  . gabapentin  200 mg Oral BID  . insulin aspart  0-15 Units Subcutaneous TID WC  . insulin aspart  0-5 Units Subcutaneous QHS  . insulin aspart  6 Units Subcutaneous TID WC  . insulin glargine  10 Units Subcutaneous Q2200  . magnesium oxide  400 mg Oral Daily  . multivitamin with minerals  1 tablet Oral Daily  . pantoprazole  40 mg Oral BID AC  . sodium chloride flush  10-40 mL Intracatheter Q12H  . sucralfate  1 g Oral TID WC & HS  . Warfarin - Pharmacist Dosing Inpatient   Does not apply q1800   Infusions:  . sodium chloride Stopped (06/11/17 0002)  . DOBUTamine 7.5 mcg/kg/min (06/11/17 2249)  . heparin 2,000  Units/hr (06/11/17 1800)    Assessment: 61yoM with LVAD on Coumadin PTA presents with weakness and confusion, found to have subtherapeutic INR at 1.51 and started on heparin bridge. Per notes, patient with recent hospitalization 7/27 for GIB.   Heparin level slightly  supratherapeutic for low goal at 0.55 (trending up from last level at 0.44). Hgb low-stable from 9/8 and no s/s bleeding noted.   Will decrease heparin gtt rate conservatively to keep in goal range.   PTA Coumadin Dose = 5mg  Tues, 10mg  all other days  Goal of Therapy:  INR 2-2.5 Heparin level 0.3-0.5 units/ml Monitor platelets by anticoagulation protocol: Yes   Plan:  Decrease heparin gtt to 1950 units/hr  Heparin level in 6 hrs (with AM labs) Daily heparin level, INR, and CBC Monitor for s/s bleeding  ADDENDUM: Heparin level therapeutic this morning at 0.37 CBC stable and no s/s bleeding Continue heparin gtt at 1950 units/hr  Confirm heparin level in 6 hrs Daily heparin level and CBC Monitor for bleeding   Argie Ramming, PharmD Clinical Pharmacist 06/11/17 11:12 PM

## 2017-06-11 NOTE — Progress Notes (Signed)
ANTICOAGULATION CONSULT NOTE - [place] for heparin and warfarin Indication: LVAD  Allergies  Allergen Reactions  . Codeine Nausea And Vomiting and Other (See Comments)    In high doses  . Trazodone And Nefazodone Other (See Comments)    Dizziness with subsequent fall   . Lipitor [Atorvastatin] Nausea Only and Other (See Comments)    Nausea with high doses, tolerates 20mg  dose (08/22/16)  . Tape Other (See Comments)    Paper tape is ok    Vital Signs: Temp: 97.4 F (36.3 C) (09/08 0721) Temp Source: Axillary (09/08 0721) BP: 104/88 (09/08 0721) Pulse Rate: 66 (09/08 0721)  Labs:  Recent Labs  06/10/17 1940 06/11/17 0730 06/11/17 0900  HGB 7.6* 7.7*  --   HCT 23.8* 24.4*  --   PLT 195 171  --   LABPROT 18.0*  --   --   INR 1.51  --   --   HEPARINUNFRC  --   --  0.25*  CREATININE 1.82* 1.54*  --     Estimated Creatinine Clearance: 59.1 mL/min (A) (by C-G formula based on SCr of 1.54 mg/dL (H)).   Medical History: Past Medical History:  Diagnosis Date  . AICD (automatic cardioverter/defibrillator) present   . ASCVD (arteriosclerotic cardiovascular disease)   . Chronic systolic CHF (congestive heart failure) ([place])   . COPD (chronic obstructive pulmonary disease) ([place])   . Coronary artery disease   . Depression   . Diabetes mellitus   . History of cocaine abuse   . Hypertension   . Presence of permanent cardiac pacemaker   . Shortness of breath dyspnea   . Suicidal ideation   . Tobacco abuse     Medications:  Prescriptions Prior to Admission  Medication Sig Dispense Refill Last Dose  . acetaminophen (TYLENOL) 500 MG tablet Take 1,000 mg by mouth every 6 (six) hours as needed for moderate pain or headache.   Past Week at PRN  . amiodarone (PACERONE) 200 MG tablet Take 1 tablet (200 mg total) by mouth daily. 30 tablet 6 06/10/2017 at Unknown time  . citalopram (CELEXA) 20 MG tablet Take 1 tablet (20 mg total) by mouth daily. 30  tablet 6 06/10/2017 at Unknown time  . digoxin (DIGOX) 0.125 MG tablet Take 1 tablet (0.125 mg total) by mouth daily. 30 tablet 6 06/10/2017 at 0900  . DOBUTamine (DOBUTREX) 4-5 MG/ML-% infusion Inject 571.8 mcg/min into the vein continuous. PER [facility] pharmacy   06/10/2017 at Unknown time  . docusate sodium (COLACE) 100 MG capsule Take 200 mg by mouth at bedtime.    06/09/2017 at Unknown time  . gabapentin (NEURONTIN) 100 MG capsule Take 2 capsules (200 mg total) by mouth 2 (two) times daily. 120 capsule 3 06/10/2017 at Unknown time  . insulin aspart (NOVOLOG FLEXPEN) 100 UNIT/ML FlexPen Inject 5 Units into the skin 3 (three) times daily with meals. 15 mL 11 06/10/2017 at Unknown time  . Insulin Glargine (LANTUS SOLOSTAR) 100 UNIT/ML Solostar Pen Inject 20 Units into the skin daily at 10 pm. (Patient taking differently: Inject 10 Units into the skin daily at 10 pm. ) 15 mL 11 06/09/2017 at Unknown time  . losartan (COZAAR) 25 MG tablet Take 0.5 tablets (12.5 mg total) by mouth daily. 30 tablet 3 06/10/2017 at Unknown time  . magnesium oxide (MAG-OX) 400 (241.3 Mg) MG tablet Take 1 tablet (400 mg total) by mouth daily. 30 tablet 6 06/10/2017 at Unknown time  . Multiple Vitamin (MULTIVITAMIN  WITH MINERALS) TABS tablet Take 1 tablet by mouth daily.   06/10/2017 at Unknown time  . ondansetron (ZOFRAN ODT) 4 MG disintegrating tablet Take 1 tablet (4 mg total) by mouth every 8 (eight) hours as needed for nausea. 6 tablet 0 unknown at PRN  . pantoprazole (PROTONIX) 40 MG tablet Take 1 tablet (40 mg total) by mouth 2 (two) times daily before a meal. 60 tablet 6 06/10/2017 at Unknown time  . potassium chloride SA (K-DUR,KLOR-CON) 20 MEQ tablet Take 2 tablets (40 mEq total) by mouth 2 (two) times daily. 120 tablet 6 06/10/2017 at Unknown time  . senna-docusate (SENOKOT S) 8.6-50 MG tablet Take 2 tablets by mouth at bedtime as needed for mild constipation. 30 tablet 6 unknown at prn  . sildenafil (REVATIO) 20 MG tablet Take 2 tablets (40  mg total) by mouth 3 (three) times daily. (Patient taking differently: Take 40 mg by mouth 2 (two) times daily. ) 120 tablet 6 06/10/2017  . spironolactone (ALDACTONE) 25 MG tablet Take 1 tablet (25 mg total) by mouth at bedtime. 30 tablet 6 06/09/2017 at Unknown time  . sucralfate (CARAFATE) 1 GM/10ML suspension Take 10 mLs (1 g total) by mouth 4 (four) times daily -  with meals and at bedtime. 420 mL 6 06/10/2017 at Unknown time  . torsemide (DEMADEX) 100 MG tablet Take 100 mg in the morning and 50 mg in the evening 60 tablet 6 06/10/2017 at Unknown time  . traMADol (ULTRAM) 50 MG tablet Take 1 tablet (50 mg total) by mouth 3 (three) times daily as needed for severe pain. 30 tablet 0 unknown at prn  . warfarin (COUMADIN) 5 MG tablet Take 5-10 mg by mouth at bedtime. Take 10 mg (2 tablets) daily except 5 mg (1 tablet) on Tuesday   06/10/2017 at Unknown time  . zolpidem (AMBIEN) 10 MG tablet Take 1 tablet (10 mg total) by mouth at bedtime as needed for sleep. (Patient taking differently: Take 10 mg by mouth at bedtime. ) 30 tablet 2 Past Week at PRN   Scheduled:  . amiodarone  200 mg Oral Daily  . citalopram  20 mg Oral Daily  . digoxin  0.125 mg Oral Daily  . docusate sodium  200 mg Oral QHS  . gabapentin  200 mg Oral BID  . insulin aspart  0-15 Units Subcutaneous TID WC  . insulin aspart  0-5 Units Subcutaneous QHS  . insulin aspart  6 Units Subcutaneous TID WC  . insulin glargine  10 Units Subcutaneous Q2200  . magnesium oxide  400 mg Oral Daily  . multivitamin with minerals  1 tablet Oral Daily  . pantoprazole  40 mg Oral BID AC  . sodium chloride flush  10-40 mL Intracatheter Q12H  . sucralfate  1 g Oral TID WC & HS  . warfarin  12.5 mg Oral ONCE-1800  . Warfarin - Pharmacist Dosing Inpatient   Does not apply q1800   Infusions:  . sodium chloride Stopped (06/11/17 0002)  . sodium chloride    . DOBUTamine 7.5 mcg/kg/min (06/11/17 0900)  . heparin 1,900 Units/hr (06/11/17 0900)     Assessment: 61yoM with LVAD on Coumadin PTA presents with weakness and confusion, found to have subtherapeutic INR at 1.51 and started on heparin bridge. Heparin currently slightly subtherapeutic at 0.25 this morning, INR 1.36, Hgb low but appears close to baseline.  PTA Coumadin Dose = 5mg  Tues, 10mg  all other days  Goal of Therapy:  INR 2-2.5 Heparin level 0.3-0.5  units/ml Monitor platelets by anticoagulation protocol: Yes   Plan:  -Increase heparin to 2000 units/hr -Check 6-hr heparin level -Coumadin 12.5mg  PO x1 tonight -Check heparin level, INR, CBC daily  Arrie Senate, PharmD PGY-2 Cardiology Pharmacy Resident Pager: [phone] 06/11/2017

## 2017-06-11 NOTE — Progress Notes (Signed)
LVAD Coordinator Rounding Note:  Admitted 06/10/17 due to nausea, weakness/pre-syncope.   HM III LVAD implated on 09/09/16 by Dr. Darcey Nora under Destination Therapy criteria.  Pt sleeping, PCs infusing.   Vital signs: HR: 62 Vpaced Doppler Pressure: 88 Automatic BP:  104/88 (96) O2 Sat: 95% on RA Wt in lbs: 219>217    LVAD interrogation reveals:   Speed: 5900 Flow: 5.7 Power:  4.6w PI: 2.0  Alarms:  6 "no external power" on 06/10/17 Events:  46 PI events on 06/10/17 Hematocrit:  24 Fixed speed:  5900 Low speed limit:  5600  Drive Line:    - dressing changes performed weekly  - dressing change due 06/14/17  Labs:  LDH trend: 187>180  INR trend: 1.5>1.36  Anticoagulation Plan: -INR Goal: 2.0 - 2.5 -ASA Dose: no ASA since hospitalization 04/29/17 for GI bleed; hx of gastritis  Gtts: Dobutamine>7.18 Heparin>2000 units/hr   Device: Medtronic BiV - DDD (lower rage 60)  -Therapies: off, end of service 08/2016  Blood products: 06/11/17 - 2 units PC's  Adverse Events on VAD: Significant Events on VAD Support:  - 2/27 - 12/06/16>hospitalized for BiV HF; discharged home on Mil 0.125 - 4/20 - 01/28/17>hospitalized for HF; tunneled PICC placed R subclavian; transitioned to Dob 5 (poor Co-ox on mil) - 6/1 - 03/08/17>hospitalized for R>L CHF; Dobutamine 5 - 7/9 - 04/16/17> hospitalized for altered mental status and HF. Psych eval; Dobutamine increased to 6 - 7/27 - 05/09/17>Hospitalization with melena and recurrent HF. EGD shoed ulcers in stomach. Dobutamine increased        from 6 to 7.5 - 8/28 - 06/03/17>hospitalization with melena and planned IP capsule.     Plan/Recommendations:   1. Weekly dressing change - due Tues 06/14/17. 2. Call VAD pager if any questions about VAD equipment or concerns  Zada Girt, RN VAD Coordinator 24/7 VAD pager: [phone]

## 2017-06-11 NOTE — Progress Notes (Signed)
Advanced Heart Failure VAD Team Note  Subjective:     Feels back to baseline this am. Rec'd 1u RBCs overnight. Dobutamine back on. Rhythm stable on tele.   LVAD INTERROGATION:  HeartMate II LVAD:  Flow 5.4 liters/min, speed 5900, power 4.5, PI 2.6. Occasional PI events  Objective:    Vital Signs:   Temp:  [97 F (36.1 C)-98.3 F (36.8 C)] 98.3 F (36.8 C) (09/08 0549) Pulse Rate:  [58-120] 68 (09/08 0549) Resp:  [13-23] 18 (09/08 0721) BP: (68-104)/(0-88) 104/88 (09/08 0721) SpO2:  [91 %-100 %] 92 % (09/08 0549) Weight:  [98.6 kg (217 lb 4.8 oz)-99.4 kg (219 lb 2.2 oz)] 98.6 kg (217 lb 4.8 oz) (09/08 0650) Last BM Date: 06/10/17 Mean arterial Pressure 80s  Intake/Output:   Intake/Output Summary (Last 24 hours) at 06/11/17 0922 Last data filed at 06/11/17 0721  Gross per 24 hour  Intake          1522.42 ml  Output              100 ml  Net          1422.42 ml     Physical Exam    General:  Lying in bed . No resp difficulty HEENT: normal Neck: supple. JVP 8-9 . Carotids 2+ bilat; no bruits. No lymphadenopathy or thyromegaly appreciated. Cor: Mechanical heart sounds with LVAD hum present. Lungs: clear Abdomen: soft, nontender, nondistended. No hepatosplenomegaly. No bruits or masses. Good bowel sounds. Driveline: C/D/I; securement device intact and driveline incorporated Extremities: no cyanosis, clubbing, rash, edema Neuro: alert & orientedx3, cranial nerves grossly intact. moves all 4 extremities w/o difficulty. Affect pleasant    Telemetry   NSR with Vpacing 70-80. Personally reviewed   EKG     Labs   Basic Metabolic Panel:  Recent Labs Lab 06/05/17 1940 06/10/17 1940  NA 131* 130*  K 5.0 4.4  CL 91* 93*  CO2 29 28  GLUCOSE 212* 242*  BUN 51* 49*  CREATININE 1.60* 1.82*  CALCIUM 9.0 8.3*    Liver Function Tests:  Recent Labs Lab 06/10/17 1940  AST 19  ALT 19  ALKPHOS 76  BILITOT 0.9  PROT 6.7  ALBUMIN 3.4*    Recent Labs Lab  06/10/17 1940  LIPASE 30    Recent Labs Lab 06/10/17 2010  AMMONIA 27    CBC:  Recent Labs Lab 06/05/17 1940 06/07/17 1136 06/10/17 1940 06/11/17 0730  WBC 7.9 9.1 10.5 8.1  NEUTROABS 6.5  --  9.3*  --   HGB 8.8* 8.3* 7.6* 7.7*  HCT 26.2* 25.7* 23.8* 24.4*  MCV 84.4 84.8 85.0 86.5  PLT 256 246 195 171    INR:  Recent Labs Lab 06/05/17 1940 06/07/17 1136 06/10/17 1940  INR 2.54 2.64 1.51    Other results:  EKG:    Imaging   Dg Chest Portable 1 View  Result Date: 06/10/2017 CLINICAL DATA:  Patient with dizziness and nausea. EXAM: PORTABLE CHEST 1 VIEW COMPARISON:  Chest radiograph 04/30/2017 FINDINGS: Central venous catheter tip projects over the superior cavoatrial junction. Multi lead AICD device overlies the left hemithorax. LVAD device. Stable cardiomegaly. Probable small left pleural effusion and left basilar opacities. No pneumothorax. IMPRESSION: Probable small left pleural effusion and left basilar atelectasis. Cardiomegaly. CVC tip projects over the superior cavoatrial junction. Electronically Signed   By: Lovey Newcomer M.D.   On: 06/10/2017 19:55      Medications:     Scheduled Medications: . amiodarone  200  mg Oral Daily  . citalopram  20 mg Oral Daily  . digoxin  0.125 mg Oral Daily  . docusate sodium  200 mg Oral QHS  . gabapentin  200 mg Oral BID  . insulin aspart  0-15 Units Subcutaneous TID WC  . insulin aspart  0-5 Units Subcutaneous QHS  . insulin aspart  6 Units Subcutaneous TID WC  . insulin glargine  10 Units Subcutaneous Q2200  . magnesium oxide  400 mg Oral Daily  . multivitamin with minerals  1 tablet Oral Daily  . pantoprazole  40 mg Oral BID AC  . sodium chloride flush  10-40 mL Intracatheter Q12H  . sucralfate  1 g Oral TID WC & HS  . warfarin  12.5 mg Oral ONCE-1800  . Warfarin - Pharmacist Dosing Inpatient   Does not apply q1800     Infusions: . sodium chloride Stopped (06/11/17 0002)  . DOBUTamine 7.5 mcg/kg/min  (06/11/17 0721)  . heparin 1,900 Units/hr (06/11/17 0721)     PRN Medications:  acetaminophen, ondansetron (ZOFRAN) IV, sodium chloride flush, traMADol, zolpidem   Patient Profile   Ronald Milleris a 61 y.o.male with history of chronic systolic HF s/p Medtronic ICD 2014, CAD s/p CABG in 2000, HTN, Hx of cocaine abuse, Tobacco abuse, Depression, PAF, and COPD. HM3 placed 09/09/2016. Admitted with weakness, nausea and pre-syncope.     Assessment/Plan:    1. Nausea/Weakness/pre-syncope - unclear etiology. Now resolved. ? Dysrhythmia or due to being off dobutamine - Will interrogate ICD this am. Discussed with MDT rep  2. Chronic end stage biventricular systolic HF: LVEF 38%, ICM. S/p HM3 LVAD 07/04/7509.  - Complicated by RV failure - Chronically on dobutamine 7.5 mcg at home however he wasn't wearing it when event happened last night. Now resumed.  - Diuretics have been on hold as he was slightly dry with AKI on admit. Will resume tomorrow  - Has been turned down by [facility] for transplant.  - No beta blocker with RV failure. - Continue digoxin 0.125 mcg  3. LVAD - PI low with multi PI events and power disconnects. Now improving with RBCs. Will give one more unit today - Suspect volume depletion +/- GI bleed with low hgb.  - INR low at 1.5 on admit. Today's INR pending . On heparin/coumadin per pharmD - LDH ok  - VAD parameters checked personally. Ok. PI events improving with transfusion.   4. Acute on chronic renal failure, stage 3 - hold diuretics one more day.   5. Iron deficiency anemia - has h/o acute gastritis on EGD. Recent capsule normal - Rec'd 1u RBCS overnight with minimal bump. Will transfuse another unit today. - Check iron stores   6. CAD: Severe 3 vessel CAD s/p CABG with occluded grafts except for LIMA.  - Denies chest pain.   7. DM - SSI - Continue lantus hs  8. PAF - In NSR. Continue Amio and warfarin   I reviewed the LVAD parameters  from today, and compared the results to the patient's prior recorded data.  No programming changes were made.  The LVAD is functioning within specified parameters.  The patient performs LVAD self-test daily.  LVAD interrogation was negative for any significant power changes, alarms or PI events/speed drops.  LVAD equipment check completed and is in good working order.  Back-up equipment present.   LVAD education done on emergency procedures and precautions and reviewed exit site care.  Length of Stay: 1  Glori Bickers, MD 06/11/2017,  9:22 AM  VAD Team --- VAD ISSUES ONLY--- Pager [phone] (7am - 7am)  Advanced Heart Failure Team  Pager [phone] (M-F; 7a - 4p)  Please contact [facility] for night-coverage after hours (4p -7a ) and weekends on [email or web address]

## 2017-06-11 NOTE — Progress Notes (Signed)
ANTICOAGULATION CONSULT NOTE - Initial Consult  Pharmacy Consult for heparin and warfarin Indication: LVAD  Allergies  Allergen Reactions  . Codeine Nausea And Vomiting and Other (See Comments)    In high doses  . Trazodone And Nefazodone Other (See Comments)    Dizziness with subsequent fall   . Lipitor [Atorvastatin] Nausea Only and Other (See Comments)    Nausea with high doses, tolerates 20mg  dose (08/22/16)  . Tape Other (See Comments)    Paper tape is ok    Vital Signs: Temp: 97.4 F (36.3 C) (09/07 1928) BP: 88/0 (09/07 2302) Pulse Rate: 59 (09/07 2300)  Labs:  Recent Labs  06/10/17 1940  HGB 7.6*  HCT 23.8*  PLT 195  LABPROT 18.0*  INR 1.51  CREATININE 1.82*    Estimated Creatinine Clearance: 49.3 mL/min (A) (by C-G formula based on SCr of 1.82 mg/dL (H)).   Medical History: Past Medical History:  Diagnosis Date  . AICD (automatic cardioverter/defibrillator) present   . ASCVD (arteriosclerotic cardiovascular disease)   . Chronic systolic CHF (congestive heart failure) ([place])   . COPD (chronic obstructive pulmonary disease) ([place])   . Coronary artery disease   . Depression   . Diabetes mellitus   . History of cocaine abuse   . Hypertension   . Presence of permanent cardiac pacemaker   . Shortness of breath dyspnea   . Suicidal ideation   . Tobacco abuse     Medications:  Prescriptions Prior to Admission  Medication Sig Dispense Refill Last Dose  . acetaminophen (TYLENOL) 500 MG tablet Take 1,000 mg by mouth every 6 (six) hours as needed for moderate pain or headache.   Past Week at PRN  . amiodarone (PACERONE) 200 MG tablet Take 1 tablet (200 mg total) by mouth daily. 30 tablet 6 06/10/2017 at Unknown time  . citalopram (CELEXA) 20 MG tablet Take 1 tablet (20 mg total) by mouth daily. 30 tablet 6 06/10/2017 at Unknown time  . digoxin (DIGOX) 0.125 MG tablet Take 1 tablet (0.125 mg total) by mouth daily. 30 tablet 6 06/10/2017 at 0900  . DOBUTamine  (DOBUTREX) 4-5 MG/ML-% infusion Inject 571.8 mcg/min into the vein continuous. PER [facility] pharmacy   06/10/2017 at Unknown time  . docusate sodium (COLACE) 100 MG capsule Take 200 mg by mouth at bedtime.    06/09/2017 at Unknown time  . gabapentin (NEURONTIN) 100 MG capsule Take 2 capsules (200 mg total) by mouth 2 (two) times daily. 120 capsule 3 06/10/2017 at Unknown time  . insulin aspart (NOVOLOG FLEXPEN) 100 UNIT/ML FlexPen Inject 5 Units into the skin 3 (three) times daily with meals. 15 mL 11 06/10/2017 at Unknown time  . Insulin Glargine (LANTUS SOLOSTAR) 100 UNIT/ML Solostar Pen Inject 20 Units into the skin daily at 10 pm. (Patient taking differently: Inject 10 Units into the skin daily at 10 pm. ) 15 mL 11 06/09/2017 at Unknown time  . losartan (COZAAR) 25 MG tablet Take 0.5 tablets (12.5 mg total) by mouth daily. 30 tablet 3 06/10/2017 at Unknown time  . magnesium oxide (MAG-OX) 400 (241.3 Mg) MG tablet Take 1 tablet (400 mg total) by mouth daily. 30 tablet 6 06/10/2017 at Unknown time  . Multiple Vitamin (MULTIVITAMIN WITH MINERALS) TABS tablet Take 1 tablet by mouth daily.   06/10/2017 at Unknown time  . ondansetron (ZOFRAN ODT) 4 MG disintegrating tablet Take 1 tablet (4 mg total) by mouth every 8 (eight) hours as needed for nausea. 6 tablet 0 unknown at  PRN  . pantoprazole (PROTONIX) 40 MG tablet Take 1 tablet (40 mg total) by mouth 2 (two) times daily before a meal. 60 tablet 6 06/10/2017 at Unknown time  . potassium chloride SA (K-DUR,KLOR-CON) 20 MEQ tablet Take 2 tablets (40 mEq total) by mouth 2 (two) times daily. 120 tablet 6 06/10/2017 at Unknown time  . senna-docusate (SENOKOT S) 8.6-50 MG tablet Take 2 tablets by mouth at bedtime as needed for mild constipation. 30 tablet 6 unknown at prn  . sildenafil (REVATIO) 20 MG tablet Take 2 tablets (40 mg total) by mouth 3 (three) times daily. (Patient taking differently: Take 40 mg by mouth 2 (two) times daily. ) 120 tablet 6 06/10/2017  . spironolactone  (ALDACTONE) 25 MG tablet Take 1 tablet (25 mg total) by mouth at bedtime. 30 tablet 6 06/09/2017 at Unknown time  . sucralfate (CARAFATE) 1 GM/10ML suspension Take 10 mLs (1 g total) by mouth 4 (four) times daily -  with meals and at bedtime. 420 mL 6 06/10/2017 at Unknown time  . torsemide (DEMADEX) 100 MG tablet Take 100 mg in the morning and 50 mg in the evening 60 tablet 6 06/10/2017 at Unknown time  . traMADol (ULTRAM) 50 MG tablet Take 1 tablet (50 mg total) by mouth 3 (three) times daily as needed for severe pain. 30 tablet 0 unknown at prn  . warfarin (COUMADIN) 5 MG tablet Take 5-10 mg by mouth at bedtime. Take 10 mg (2 tablets) daily except 5 mg (1 tablet) on Tuesday   06/10/2017 at Unknown time  . zolpidem (AMBIEN) 10 MG tablet Take 1 tablet (10 mg total) by mouth at bedtime as needed for sleep. (Patient taking differently: Take 10 mg by mouth at bedtime. ) 30 tablet 2 Past Week at PRN   Scheduled:  . amiodarone  200 mg Oral Daily  . citalopram  20 mg Oral Daily  . digoxin  0.125 mg Oral Daily  . docusate sodium  200 mg Oral QHS  . gabapentin  200 mg Oral BID  . insulin aspart  0-15 Units Subcutaneous TID WC  . insulin aspart  0-5 Units Subcutaneous QHS  . insulin aspart  6 Units Subcutaneous TID WC  . insulin glargine  10 Units Subcutaneous Q2200  . magnesium oxide  400 mg Oral Daily  . multivitamin with minerals  1 tablet Oral Daily  . pantoprazole  40 mg Oral BID AC  . sodium chloride flush  10-40 mL Intracatheter Q12H  . sucralfate  1 g Oral TID WC & HS   Infusions:  . sodium chloride Stopped (06/11/17 0002)  . DOBUTamine 7.5 mcg/kg/min (06/11/17 0002)    Assessment: 61yo male w/ significant cardiac hx presents w/ acute weakness and confusion, concern for GIB w/ volume depletion and low Hgb, to continue Coumadin for LVAD; current INR below goal w/ last dose of Coumadin taken 9/7 prior to arrival, to begin heparin bridge.  Goal of Therapy:  INR 2-2.5 Heparin level 0.3-0.5  units/ml Monitor platelets by anticoagulation protocol: Yes   Plan:  Will begin heparin gtt at 1900 units/hr (previously therapeutic at this rate) and monitor heparin levels and CBC.  Will give boosted Coumadin dose of 12.5mg  on 9/8 and monitor INR for dose adjustments.  Wynona Neat, PharmD, BCPS  06/11/2017,12:31 AM

## 2017-06-11 NOTE — Progress Notes (Signed)
ANTICOAGULATION CONSULT NOTE - [place] for heparin and warfarin Indication: LVAD  Allergies  Allergen Reactions  . Codeine Nausea And Vomiting and Other (See Comments)    In high doses  . Trazodone And Nefazodone Other (See Comments)    Dizziness with subsequent fall   . Lipitor [Atorvastatin] Nausea Only and Other (See Comments)    Nausea with high doses, tolerates 20mg  dose (08/22/16)  . Tape Other (See Comments)    Paper tape is ok    Vital Signs: Temp: 97.7 F (36.5 C) (09/08 1615) Temp Source: Oral (09/08 1615) BP: 90/74 (09/08 1615) Pulse Rate: 63 (09/08 1615)  Labs:  Recent Labs  06/10/17 1940 06/11/17 0730 06/11/17 0900 06/11/17 1615  HGB 7.6* 7.7*  --   --   HCT 23.8* 24.4*  --   --   PLT 195 171  --   --   LABPROT 18.0*  --  16.6*  --   INR 1.51  --  1.36  --   HEPARINUNFRC  --   --  0.25* 0.44  CREATININE 1.82* 1.54*  --   --     Estimated Creatinine Clearance: 59.1 mL/min (A) (by C-G formula based on SCr of 1.54 mg/dL (H)).   Medical History: Past Medical History:  Diagnosis Date  . AICD (automatic cardioverter/defibrillator) present   . ASCVD (arteriosclerotic cardiovascular disease)   . Chronic systolic CHF (congestive heart failure) ([place])   . COPD (chronic obstructive pulmonary disease) ([place])   . Coronary artery disease   . Depression   . Diabetes mellitus   . History of cocaine abuse   . Hypertension   . Presence of permanent cardiac pacemaker   . Shortness of breath dyspnea   . Suicidal ideation   . Tobacco abuse     Medications:  Prescriptions Prior to Admission  Medication Sig Dispense Refill Last Dose  . acetaminophen (TYLENOL) 500 MG tablet Take 1,000 mg by mouth every 6 (six) hours as needed for moderate pain or headache.   Past Week at PRN  . amiodarone (PACERONE) 200 MG tablet Take 1 tablet (200 mg total) by mouth daily. 30 tablet 6 06/10/2017 at Unknown time  . citalopram (CELEXA) 20 MG tablet Take 1  tablet (20 mg total) by mouth daily. 30 tablet 6 06/10/2017 at Unknown time  . digoxin (DIGOX) 0.125 MG tablet Take 1 tablet (0.125 mg total) by mouth daily. 30 tablet 6 06/10/2017 at 0900  . DOBUTamine (DOBUTREX) 4-5 MG/ML-% infusion Inject 571.8 mcg/min into the vein continuous. PER [facility] pharmacy   06/10/2017 at Unknown time  . docusate sodium (COLACE) 100 MG capsule Take 200 mg by mouth at bedtime.    06/09/2017 at Unknown time  . gabapentin (NEURONTIN) 100 MG capsule Take 2 capsules (200 mg total) by mouth 2 (two) times daily. 120 capsule 3 06/10/2017 at Unknown time  . insulin aspart (NOVOLOG FLEXPEN) 100 UNIT/ML FlexPen Inject 5 Units into the skin 3 (three) times daily with meals. 15 mL 11 06/10/2017 at Unknown time  . Insulin Glargine (LANTUS SOLOSTAR) 100 UNIT/ML Solostar Pen Inject 20 Units into the skin daily at 10 pm. (Patient taking differently: Inject 10 Units into the skin daily at 10 pm. ) 15 mL 11 06/09/2017 at Unknown time  . losartan (COZAAR) 25 MG tablet Take 0.5 tablets (12.5 mg total) by mouth daily. 30 tablet 3 06/10/2017 at Unknown time  . magnesium oxide (MAG-OX) 400 (241.3 Mg) MG tablet Take 1 tablet (400  mg total) by mouth daily. 30 tablet 6 06/10/2017 at Unknown time  . Multiple Vitamin (MULTIVITAMIN WITH MINERALS) TABS tablet Take 1 tablet by mouth daily.   06/10/2017 at Unknown time  . ondansetron (ZOFRAN ODT) 4 MG disintegrating tablet Take 1 tablet (4 mg total) by mouth every 8 (eight) hours as needed for nausea. 6 tablet 0 unknown at PRN  . pantoprazole (PROTONIX) 40 MG tablet Take 1 tablet (40 mg total) by mouth 2 (two) times daily before a meal. 60 tablet 6 06/10/2017 at Unknown time  . potassium chloride SA (K-DUR,KLOR-CON) 20 MEQ tablet Take 2 tablets (40 mEq total) by mouth 2 (two) times daily. 120 tablet 6 06/10/2017 at Unknown time  . senna-docusate (SENOKOT S) 8.6-50 MG tablet Take 2 tablets by mouth at bedtime as needed for mild constipation. 30 tablet 6 unknown at prn  . sildenafil  (REVATIO) 20 MG tablet Take 2 tablets (40 mg total) by mouth 3 (three) times daily. (Patient taking differently: Take 40 mg by mouth 2 (two) times daily. ) 120 tablet 6 06/10/2017  . spironolactone (ALDACTONE) 25 MG tablet Take 1 tablet (25 mg total) by mouth at bedtime. 30 tablet 6 06/09/2017 at Unknown time  . sucralfate (CARAFATE) 1 GM/10ML suspension Take 10 mLs (1 g total) by mouth 4 (four) times daily -  with meals and at bedtime. 420 mL 6 06/10/2017 at Unknown time  . torsemide (DEMADEX) 100 MG tablet Take 100 mg in the morning and 50 mg in the evening 60 tablet 6 06/10/2017 at Unknown time  . traMADol (ULTRAM) 50 MG tablet Take 1 tablet (50 mg total) by mouth 3 (three) times daily as needed for severe pain. 30 tablet 0 unknown at prn  . warfarin (COUMADIN) 5 MG tablet Take 5-10 mg by mouth at bedtime. Take 10 mg (2 tablets) daily except 5 mg (1 tablet) on Tuesday   06/10/2017 at Unknown time  . zolpidem (AMBIEN) 10 MG tablet Take 1 tablet (10 mg total) by mouth at bedtime as needed for sleep. (Patient taking differently: Take 10 mg by mouth at bedtime. ) 30 tablet 2 Past Week at PRN   Scheduled:  . amiodarone  200 mg Oral Daily  . citalopram  20 mg Oral Daily  . digoxin  0.125 mg Oral Daily  . docusate sodium  200 mg Oral QHS  . gabapentin  200 mg Oral BID  . insulin aspart  0-15 Units Subcutaneous TID WC  . insulin aspart  0-5 Units Subcutaneous QHS  . insulin aspart  6 Units Subcutaneous TID WC  . insulin glargine  10 Units Subcutaneous Q2200  . magnesium oxide  400 mg Oral Daily  . multivitamin with minerals  1 tablet Oral Daily  . pantoprazole  40 mg Oral BID AC  . sodium chloride flush  10-40 mL Intracatheter Q12H  . sucralfate  1 g Oral TID WC & HS  . warfarin  12.5 mg Oral ONCE-1800  . Warfarin - Pharmacist Dosing Inpatient   Does not apply q1800   Infusions:  . sodium chloride Stopped (06/11/17 0002)  . DOBUTamine 7.5 mcg/kg/min (06/11/17 1615)  . heparin 2,000 Units/hr (06/11/17  1615)    Assessment: 61yoM with LVAD on Coumadin PTA presents with weakness and confusion, found to have subtherapeutic INR at 1.51 and started on heparin bridge.  Heparin level this evening is therapeutic after a rate increase earlier today (HL 0.44 << 0.25, goal of 0.3-0.7).   PTA Coumadin Dose = 5mg   Tues, 10mg  all other days  Goal of Therapy:  INR 2-2.5 Heparin level 0.3-0.5 units/ml Monitor platelets by anticoagulation protocol: Yes   Plan:  1. Continue Heparin at 2000 units/hr (20 ml/hr)  2. Will continue to monitor for any signs/symptoms of bleeding and will follow up with heparin level in 6 hours to confirm therapeutic.   Thank you for allowing pharmacy to be a part of this patient's care.  Alycia Rossetti, PharmD, BCPS Clinical Pharmacist Pager: [phone] 06/11/2017 5:17 PM

## 2017-06-11 NOTE — Progress Notes (Signed)
Patient has blood infusing.  Morning labs delayed until 2 hours post transfusion per protocol.

## 2017-06-11 NOTE — Progress Notes (Signed)
All labs including co-ox drawn and sent to lab and respiratory.

## 2017-06-12 LAB — BASIC METABOLIC PANEL
Anion gap: 6 (ref 5–15)
BUN: 31 mg/dL — AB (ref 6–20)
CALCIUM: 8.5 mg/dL — AB (ref 8.9–10.3)
CO2: 31 mmol/L (ref 22–32)
Chloride: 94 mmol/L — ABNORMAL LOW (ref 101–111)
Creatinine, Ser: 1.4 mg/dL — ABNORMAL HIGH (ref 0.61–1.24)
GFR calc Af Amer: >60 mL/min (ref >60)
GFR, EST NON AFRICAN AMERICAN: 53 mL/min — AB (ref >60)
GLUCOSE: 154 mg/dL — AB (ref 65–99)
Potassium: 4.2 mmol/L (ref 3.5–5.1)
Sodium: 131 mmol/L — ABNORMAL LOW (ref 135–145)

## 2017-06-12 LAB — TYPE AND SCREEN
ABO/RH(D): O POS
ANTIBODY SCREEN: NEGATIVE
UNIT DIVISION: 0
Unit division: 0

## 2017-06-12 LAB — HEPARIN LEVEL (UNFRACTIONATED)
HEPARIN UNFRACTIONATED: 0.37 [IU]/mL (ref 0.30–0.70)
HEPARIN UNFRACTIONATED: 0.45 [IU]/mL (ref 0.30–0.70)

## 2017-06-12 LAB — BPAM RBC
Blood Product Expiration Date: 201809152359
Blood Product Expiration Date: 201810032359
ISSUE DATE / TIME: 201809080200
ISSUE DATE / TIME: 201809080959
UNIT TYPE AND RH: 5100
Unit Type and Rh: 5100

## 2017-06-12 LAB — GLUCOSE, CAPILLARY
GLUCOSE-CAPILLARY: 154 mg/dL — AB (ref 65–99)
GLUCOSE-CAPILLARY: 153 mg/dL — AB (ref 65–99)
Glucose-Capillary: 177 mg/dL — ABNORMAL HIGH (ref 65–99)
Glucose-Capillary: 168 mg/dL — ABNORMAL HIGH (ref 65–99)
Glucose-Capillary: 143 mg/dL — ABNORMAL HIGH (ref 65–99)

## 2017-06-12 LAB — LACTATE DEHYDROGENASE: LDH: 155 U/L (ref 98–192)

## 2017-06-12 LAB — CBC
HCT: 27.4 % — ABNORMAL LOW (ref 39.0–52.0)
Hemoglobin: 8.5 g/dL — ABNORMAL LOW (ref 13.0–17.0)
MCH: 26.9 pg (ref 26.0–34.0)
MCHC: 31 g/dL (ref 30.0–36.0)
MCV: 86.7 fL (ref 78.0–100.0)
PLATELETS: 160 10*3/uL (ref 150–400)
RBC: 3.16 MIL/uL — ABNORMAL LOW (ref 4.22–5.81)
RDW: 17.9 % — AB (ref 11.5–15.5)
WBC: 7.5 10*3/uL (ref 4.0–10.5)

## 2017-06-12 LAB — COOXEMETRY PANEL
Carboxyhemoglobin: 1.9 % — ABNORMAL HIGH (ref 0.5–1.5)
METHEMOGLOBIN: 0.9 % (ref 0.0–1.5)
O2 Saturation: 55.8 %
Total hemoglobin: 8.7 g/dL — ABNORMAL LOW (ref 12.0–16.0)

## 2017-06-12 LAB — PROTIME-INR
INR: 1.22
Prothrombin Time: 15.3 seconds — ABNORMAL HIGH (ref 11.4–15.2)

## 2017-06-12 MED ORDER — SPIRONOLACTONE 25 MG PO TABS
12.5000 mg | ORAL_TABLET | Freq: Every day | ORAL | Status: DC
  Administered 2017-06-12 – 2017-06-15 (×4): 12.5 mg via ORAL
  Filled 2017-06-12 (×4): qty 1

## 2017-06-12 MED ORDER — TORSEMIDE 20 MG PO TABS
50.0000 mg | ORAL_TABLET | Freq: Two times a day (BID) | ORAL | Status: DC
  Administered 2017-06-12 – 2017-06-15 (×7): 50 mg via ORAL
  Filled 2017-06-12 (×7): qty 3

## 2017-06-12 MED ORDER — SODIUM CHLORIDE 0.9 % IV SOLN
510.0000 mg | Freq: Once | INTRAVENOUS | Status: AC
  Administered 2017-06-12: 510 mg via INTRAVENOUS
  Filled 2017-06-12: qty 17

## 2017-06-12 MED ORDER — WARFARIN SODIUM 2.5 MG PO TABS
12.5000 mg | ORAL_TABLET | Freq: Once | ORAL | Status: AC
  Administered 2017-06-12: 17:00:00 12.5 mg via ORAL
  Filled 2017-06-12: qty 1

## 2017-06-12 NOTE — Progress Notes (Signed)
ANTICOAGULATION CONSULT NOTE - [place] for heparin and warfarin Indication: LVAD  Allergies  Allergen Reactions  . Codeine Nausea And Vomiting and Other (See Comments)    In high doses  . Trazodone And Nefazodone Other (See Comments)    Dizziness with subsequent fall   . Lipitor [Atorvastatin] Nausea Only and Other (See Comments)    Nausea with high doses, tolerates 20mg  dose (08/22/16)  . Tape Other (See Comments)    Paper tape is ok    Vital Signs: Temp: 98.1 F (36.7 C) (09/09 1137) Temp Source: Oral (09/09 1137) BP: 92/68 (09/09 1137) Pulse Rate: 60 (09/09 1137)  Labs:  Recent Labs  06/10/17 1940 06/11/17 0730 06/11/17 0900  06/11/17 2240 06/12/17 0500 06/12/17 1243  HGB 7.6* 7.7*  --   --   --  8.5*  --   HCT 23.8* 24.4*  --   --   --  27.4*  --   PLT 195 171  --   --   --  160  --   LABPROT 18.0*  --  16.6*  --   --  15.3*  --   INR 1.51  --  1.36  --   --  1.22  --   HEPARINUNFRC  --   --  0.25*  < > 0.55 0.37 0.45  CREATININE 1.82* 1.54*  --   --   --  1.40*  --   < > = values in this interval not displayed.  Estimated Creatinine Clearance: 63 mL/min (A) (by C-G formula based on SCr of 1.4 mg/dL (H)).   Medical History: Past Medical History:  Diagnosis Date  . AICD (automatic cardioverter/defibrillator) present   . ASCVD (arteriosclerotic cardiovascular disease)   . Chronic systolic CHF (congestive heart failure) ([place])   . COPD (chronic obstructive pulmonary disease) ([place])   . Coronary artery disease   . Depression   . Diabetes mellitus   . History of cocaine abuse   . Hypertension   . Presence of permanent cardiac pacemaker   . Shortness of breath dyspnea   . Suicidal ideation   . Tobacco abuse     Medications:  Prescriptions Prior to Admission  Medication Sig Dispense Refill Last Dose  . acetaminophen (TYLENOL) 500 MG tablet Take 1,000 mg by mouth every 6 (six) hours as needed for moderate pain or headache.    Past Week at PRN  . amiodarone (PACERONE) 200 MG tablet Take 1 tablet (200 mg total) by mouth daily. 30 tablet 6 06/10/2017 at Unknown time  . citalopram (CELEXA) 20 MG tablet Take 1 tablet (20 mg total) by mouth daily. 30 tablet 6 06/10/2017 at Unknown time  . digoxin (DIGOX) 0.125 MG tablet Take 1 tablet (0.125 mg total) by mouth daily. 30 tablet 6 06/10/2017 at 0900  . DOBUTamine (DOBUTREX) 4-5 MG/ML-% infusion Inject 571.8 mcg/min into the vein continuous. PER [facility] pharmacy   06/10/2017 at Unknown time  . docusate sodium (COLACE) 100 MG capsule Take 200 mg by mouth at bedtime.    06/09/2017 at Unknown time  . gabapentin (NEURONTIN) 100 MG capsule Take 2 capsules (200 mg total) by mouth 2 (two) times daily. 120 capsule 3 06/10/2017 at Unknown time  . insulin aspart (NOVOLOG FLEXPEN) 100 UNIT/ML FlexPen Inject 5 Units into the skin 3 (three) times daily with meals. 15 mL 11 06/10/2017 at Unknown time  . Insulin Glargine (LANTUS SOLOSTAR) 100 UNIT/ML Solostar Pen Inject 20 Units into the skin daily at  10 pm. (Patient taking differently: Inject 10 Units into the skin daily at 10 pm. ) 15 mL 11 06/09/2017 at Unknown time  . losartan (COZAAR) 25 MG tablet Take 0.5 tablets (12.5 mg total) by mouth daily. 30 tablet 3 06/10/2017 at Unknown time  . magnesium oxide (MAG-OX) 400 (241.3 Mg) MG tablet Take 1 tablet (400 mg total) by mouth daily. 30 tablet 6 06/10/2017 at Unknown time  . Multiple Vitamin (MULTIVITAMIN WITH MINERALS) TABS tablet Take 1 tablet by mouth daily.   06/10/2017 at Unknown time  . ondansetron (ZOFRAN ODT) 4 MG disintegrating tablet Take 1 tablet (4 mg total) by mouth every 8 (eight) hours as needed for nausea. 6 tablet 0 unknown at PRN  . pantoprazole (PROTONIX) 40 MG tablet Take 1 tablet (40 mg total) by mouth 2 (two) times daily before a meal. 60 tablet 6 06/10/2017 at Unknown time  . potassium chloride SA (K-DUR,KLOR-CON) 20 MEQ tablet Take 2 tablets (40 mEq total) by mouth 2 (two) times daily. 120 tablet 6  06/10/2017 at Unknown time  . senna-docusate (SENOKOT S) 8.6-50 MG tablet Take 2 tablets by mouth at bedtime as needed for mild constipation. 30 tablet 6 unknown at prn  . sildenafil (REVATIO) 20 MG tablet Take 2 tablets (40 mg total) by mouth 3 (three) times daily. (Patient taking differently: Take 40 mg by mouth 2 (two) times daily. ) 120 tablet 6 06/10/2017  . spironolactone (ALDACTONE) 25 MG tablet Take 1 tablet (25 mg total) by mouth at bedtime. 30 tablet 6 06/09/2017 at Unknown time  . sucralfate (CARAFATE) 1 GM/10ML suspension Take 10 mLs (1 g total) by mouth 4 (four) times daily -  with meals and at bedtime. 420 mL 6 06/10/2017 at Unknown time  . torsemide (DEMADEX) 100 MG tablet Take 100 mg in the morning and 50 mg in the evening 60 tablet 6 06/10/2017 at Unknown time  . traMADol (ULTRAM) 50 MG tablet Take 1 tablet (50 mg total) by mouth 3 (three) times daily as needed for severe pain. 30 tablet 0 unknown at prn  . warfarin (COUMADIN) 5 MG tablet Take 5-10 mg by mouth at bedtime. Take 10 mg (2 tablets) daily except 5 mg (1 tablet) on Tuesday   06/10/2017 at Unknown time  . zolpidem (AMBIEN) 10 MG tablet Take 1 tablet (10 mg total) by mouth at bedtime as needed for sleep. (Patient taking differently: Take 10 mg by mouth at bedtime. ) 30 tablet 2 Past Week at PRN   Scheduled:  . amiodarone  200 mg Oral Daily  . citalopram  20 mg Oral Daily  . digoxin  0.125 mg Oral Daily  . docusate sodium  200 mg Oral QHS  . gabapentin  200 mg Oral BID  . insulin aspart  0-15 Units Subcutaneous TID WC  . insulin aspart  0-5 Units Subcutaneous QHS  . insulin aspart  6 Units Subcutaneous TID WC  . insulin glargine  10 Units Subcutaneous Q2200  . magnesium oxide  400 mg Oral Daily  . multivitamin with minerals  1 tablet Oral Daily  . pantoprazole  40 mg Oral BID AC  . sodium chloride flush  10-40 mL Intracatheter Q12H  . spironolactone  12.5 mg Oral Daily  . sucralfate  1 g Oral TID WC & HS  . torsemide  50 mg  Oral BID  . Warfarin - Pharmacist Dosing Inpatient   Does not apply q1800   Infusions:  . sodium chloride Stopped (06/11/17 0002)  .  DOBUTamine 7.5 mcg/kg/min (06/12/17 1137)  . heparin 1,950 Units/hr (06/12/17 1137)    Assessment: 61yoM with LVAD on Coumadin PTA presents with weakness and confusion, found to have subtherapeutic INR at 1.51 and started on heparin bridge. Heparin currently therapeutic at 0.45, INR subtherapeutic at 1.22, Hgb improved this morning.  PTA Coumadin Dose = 5mg  Tues, 10mg  all other days  Goal of Therapy:  INR 2-2.5 Heparin level 0.3-0.5 units/ml Monitor platelets by anticoagulation protocol: Yes   Plan:  -Continue heparin 1950 units/hr -Coumadin 12.5mg  PO x1 - slightly boosted dose again tonight -Check heparin level, INR, CBC daily  Arrie Senate, PharmD PGY-2 Cardiology Pharmacy Resident Pager: [phone] 06/12/2017

## 2017-06-12 NOTE — Progress Notes (Signed)
Advanced Heart Failure VAD Team Note  Subjective:    Feels ok this am. No SOB. Confusion resolved. Got 1 u RBCs. ghb 8.5 co-ox 56%  LVAD INTERROGATION:  HeartMate II LVAD:  Flow 5.6 liters/min, speed 5900, power 5.0, PI 2.6. Occasional PI events  Objective:    Vital Signs:   Temp:  [97.5 F (36.4 C)-98.6 F (37 C)] 98.1 F (36.7 C) (09/09 0754) Pulse Rate:  [60-72] 65 (09/09 0754) Resp:  [12-22] 19 (09/09 0754) BP: (84-126)/(36-86) 126/78 (09/09 0754) SpO2:  [93 %-100 %] 95 % (09/09 0754) Weight:  [203 lb 11.3 oz (92.4 kg)] 203 lb 11.3 oz (92.4 kg) (09/09 0500) Last BM Date: 06/10/17 Mean arterial Pressure 80-90s  Intake/Output:   Intake/Output Summary (Last 24 hours) at 06/12/17 1036 Last data filed at 06/12/17 0900  Gross per 24 hour  Intake          1620.39 ml  Output              500 ml  Net          1120.39 ml     Physical Exam    General:  NAD.  HEENT: normal  Neck: supple. JVP hard to see. ? 9-10  Carotids 2+ bilat; no bruits. No lymphadenopathy or thryomegaly appreciated. Cor: LVAD hum.  Lungs: Clear. Abdomen: obese soft, nontender, non-distended. No hepatosplenomegaly. No bruits or masses. Good bowel sounds. Driveline site clean. Anchor in place.  Extremities: no cyanosis, clubbing, rash. Warm. Trace edema  Neuro: alert & oriented x 3. No focal deficits. Moves all 4 without problem    Telemetry   NSR with Vpacing 70s. Personally reviewed   EKG     Labs   Basic Metabolic Panel:  Recent Labs Lab 06/05/17 1940 06/10/17 1940 06/11/17 0730 06/12/17 0500  NA 131* 130* 129* 131*  K 5.0 4.4 4.5 4.2  CL 91* 93* 92* 94*  CO2 29 28 30 31   GLUCOSE 212* 242* 161* 154*  BUN 51* 49* 40* 31*  CREATININE 1.60* 1.82* 1.54* 1.40*  CALCIUM 9.0 8.3* 7.5* 8.5*    Liver Function Tests:  Recent Labs Lab 06/10/17 1940  AST 19  ALT 19  ALKPHOS 76  BILITOT 0.9  PROT 6.7  ALBUMIN 3.4*    Recent Labs Lab 06/10/17 1940  LIPASE 30    Recent  Labs Lab 06/10/17 2010  AMMONIA 27    CBC:  Recent Labs Lab 06/05/17 1940 06/07/17 1136 06/10/17 1940 06/11/17 0730 06/12/17 0500  WBC 7.9 9.1 10.5 8.1 7.5  NEUTROABS 6.5  --  9.3*  --   --   HGB 8.8* 8.3* 7.6* 7.7* 8.5*  HCT 26.2* 25.7* 23.8* 24.4* 27.4*  MCV 84.4 84.8 85.0 86.5 86.7  PLT 256 246 195 171 160    INR:  Recent Labs Lab 06/05/17 1940 06/07/17 1136 06/10/17 1940 06/11/17 0900 06/12/17 0500  INR 2.54 2.64 1.51 1.36 1.22    Other results:     Imaging   Dg Chest Portable 1 View  Result Date: 06/10/2017 CLINICAL DATA:  Patient with dizziness and nausea. EXAM: PORTABLE CHEST 1 VIEW COMPARISON:  Chest radiograph 04/30/2017 FINDINGS: Central venous catheter tip projects over the superior cavoatrial junction. Multi lead AICD device overlies the left hemithorax. LVAD device. Stable cardiomegaly. Probable small left pleural effusion and left basilar opacities. No pneumothorax. IMPRESSION: Probable small left pleural effusion and left basilar atelectasis. Cardiomegaly. CVC tip projects over the superior cavoatrial junction. Electronically Signed   By: Dian Situ  Rosana Hoes M.D.   On: 06/10/2017 19:55     Medications:     Scheduled Medications: . amiodarone  200 mg Oral Daily  . citalopram  20 mg Oral Daily  . digoxin  0.125 mg Oral Daily  . docusate sodium  200 mg Oral QHS  . gabapentin  200 mg Oral BID  . insulin aspart  0-15 Units Subcutaneous TID WC  . insulin aspart  0-5 Units Subcutaneous QHS  . insulin aspart  6 Units Subcutaneous TID WC  . insulin glargine  10 Units Subcutaneous Q2200  . magnesium oxide  400 mg Oral Daily  . multivitamin with minerals  1 tablet Oral Daily  . pantoprazole  40 mg Oral BID AC  . sodium chloride flush  10-40 mL Intracatheter Q12H  . sucralfate  1 g Oral TID WC & HS  . Warfarin - Pharmacist Dosing Inpatient   Does not apply q1800    Infusions: . sodium chloride Stopped (06/11/17 0002)  . DOBUTamine 7.5 mcg/kg/min  (06/12/17 0900)  . heparin 1,950 Units/hr (06/12/17 0900)    PRN Medications: acetaminophen, ondansetron (ZOFRAN) IV, sodium chloride flush, traMADol, zolpidem   Patient Profile   Ronald Milleris a 61 y.o.male with history of chronic systolic HF s/p Medtronic ICD 2014, CAD s/p CABG in 2000, HTN, Hx of cocaine abuse, Tobacco abuse, Depression, PAF, and COPD. HM3 placed 09/09/2016. Admitted with weakness, nausea and pre-syncope.     Assessment/Plan:    1. Nausea/Weakness/pre-syncope - unclear etiology. Now resolved. ? Dysrhythmia or due to being off dobutamine - ICD interrogated. No high-rate episodes. Fluid up  2. Chronic end stage biventricular systolic HF: LVEF 49%, ICM. S/p HM3 LVAD 44/06/6758.  - Complicated by RV failure - Chronically on dobutamine 7.5 mcg at home however he wasn't wearing it when event happened last night. Now resumed.  - Co-ox remains marginal with low PI. May need repeat ramp echo - Diuretics have been on hold as he was slightly dry with AKI on admit. Will resume today. Weights inaccurate - Has been [facility].  - No beta blocker with RV failure. - Continue digoxin 0.125 mcg  3. LVAD - PI low with multi PI events and power disconnects. Now improving with RBCs. Will give one more unit today - Suspect volume depletion +/- GI bleed with low hgb.  - INR low at 1.5 on admit. Today's INR 1.2 . On heparin/coumadin per pharmD - LDH ok  - VAD parameters checked personally. Ok. PI events improving with transfusion.   4. Acute on chronic renal failure, stage 3 - Improved. Can resume diuretics   5. Iron deficiency anemia - has h/o acute gastritis on EGD. Recent capsule normal - Rec'd 2u RBCS so far this admit with minimal bump 7.6->8.5. No obvious bleeding on exam  - Iron stores low. Will need second dose Feraheme (had one in July)  6. CAD: Severe 3 vessel CAD s/p CABG with occluded grafts except for LIMA.  - Denies chest  pain.   7. DM - SSI - Continue lantus hs  8. PAF - In NSR. Continue Amio and warfarin   I reviewed the LVAD parameters from today, and compared the results to the patient's prior recorded data.  No programming changes were made.  The LVAD is functioning within specified parameters.  The patient performs LVAD self-test daily.  LVAD interrogation was negative for any significant power changes, alarms or PI events/speed drops.  LVAD equipment check completed and  is in good working order.  Back-up equipment present.   LVAD education done on emergency procedures and precautions and reviewed exit site care.  Length of Stay: 2  Glori Bickers, MD 06/12/2017, 10:36 AM  VAD Team --- VAD ISSUES ONLY--- Pager [phone] (7am - 7am) Advanced Heart Failure Team  Pager [phone] (M-F; 7a - 4p)  Please contact [facility] for night-coverage after hours (4p -7a ) and weekends on [email or web address]

## 2017-06-13 ENCOUNTER — Inpatient Hospital Stay (HOSPITAL_COMMUNITY): Payer: Medicare HMO

## 2017-06-13 LAB — LACTATE DEHYDROGENASE: LDH: 163 U/L (ref 98–192)

## 2017-06-13 LAB — CBC
HCT: 26.6 % — ABNORMAL LOW (ref 39.0–52.0)
HEMOGLOBIN: 8.3 g/dL — AB (ref 13.0–17.0)
MCH: 27.1 pg (ref 26.0–34.0)
MCHC: 31.2 g/dL (ref 30.0–36.0)
MCV: 86.9 fL (ref 78.0–100.0)
PLATELETS: 145 10*3/uL — AB (ref 150–400)
RBC: 3.06 MIL/uL — AB (ref 4.22–5.81)
RDW: 18 % — ABNORMAL HIGH (ref 11.5–15.5)
WBC: 7.1 10*3/uL (ref 4.0–10.5)

## 2017-06-13 LAB — BASIC METABOLIC PANEL
Anion gap: 5 (ref 5–15)
BUN: 22 mg/dL — AB (ref 6–20)
CHLORIDE: 96 mmol/L — AB (ref 101–111)
CO2: 32 mmol/L (ref 22–32)
Calcium: 8.4 mg/dL — ABNORMAL LOW (ref 8.9–10.3)
Creatinine, Ser: 1.23 mg/dL (ref 0.61–1.24)
GFR calc Af Amer: >60 mL/min (ref >60)
GFR calc non Af Amer: >60 mL/min (ref >60)
GLUCOSE: 120 mg/dL — AB (ref 65–99)
POTASSIUM: 3.8 mmol/L (ref 3.5–5.1)
Sodium: 133 mmol/L — ABNORMAL LOW (ref 135–145)

## 2017-06-13 LAB — PROTIME-INR
INR: 1.41
Prothrombin Time: 17.2 seconds — ABNORMAL HIGH (ref 11.4–15.2)

## 2017-06-13 LAB — HEPARIN LEVEL (UNFRACTIONATED): HEPARIN UNFRACTIONATED: 0.33 [IU]/mL (ref 0.30–0.70)

## 2017-06-13 LAB — COOXEMETRY PANEL
Carboxyhemoglobin: 2 % — ABNORMAL HIGH (ref 0.5–1.5)
METHEMOGLOBIN: 1.3 % (ref 0.0–1.5)
O2 Saturation: 68.4 %
Total hemoglobin: 8.5 g/dL — ABNORMAL LOW (ref 12.0–16.0)

## 2017-06-13 LAB — GLUCOSE, CAPILLARY
GLUCOSE-CAPILLARY: 97 mg/dL (ref 65–99)
GLUCOSE-CAPILLARY: 131 mg/dL — AB (ref 65–99)
Glucose-Capillary: 189 mg/dL — ABNORMAL HIGH (ref 65–99)
Glucose-Capillary: 155 mg/dL — ABNORMAL HIGH (ref 65–99)

## 2017-06-13 MED ORDER — POLYETHYLENE GLYCOL 3350 17 G PO PACK
17.0000 g | PACK | Freq: Every day | ORAL | Status: DC
  Administered 2017-06-13: 17 g via ORAL
  Filled 2017-06-13: qty 1

## 2017-06-13 MED ORDER — MAGNESIUM CITRATE PO SOLN
300.0000 mL | Freq: Once | ORAL | Status: DC
  Filled 2017-06-13: qty 592

## 2017-06-13 MED ORDER — WARFARIN SODIUM 7.5 MG PO TABS
15.0000 mg | ORAL_TABLET | Freq: Once | ORAL | Status: AC
  Administered 2017-06-13: 15 mg via ORAL
  Filled 2017-06-13: qty 2

## 2017-06-13 MED ORDER — POLYETHYLENE GLYCOL 3350 17 G PO PACK
17.0000 g | PACK | Freq: Two times a day (BID) | ORAL | Status: DC | PRN
  Administered 2017-06-13: 17 g via ORAL
  Filled 2017-06-13: qty 1

## 2017-06-13 NOTE — Progress Notes (Signed)
LVAD Coordinator Rounding Note:  Admitted 06/10/17 due to nausea, weakness/pre-syncope.   HM III LVAD implated on 09/09/16 by Dr. Darcey Nora under Destination Therapy criteria.   Vital signs: HR: 61  Doppler Pressure: 80 Automatic BP:  138/74 (82) O2 Sat: 98% on RA Wt in lbs: 219>217> 220>221   LVAD interrogation reveals:   Speed: 5900 Flow: 5.4 Power:  4.7w PI: 2.7 Alarms: none in last 24 hours Events:  9/9-4 PI events, 9/10- 7 PI events  Hematocrit:  26- adjusted personally Fixed speed:  5900 Low speed limit:  5600  Drive Line: unremarkable. Due to be changed tuesdays using weekly kit.  Labs:  LDH trend: 187>180>163  INR trend: 1.5>1.36>1.41  Anticoagulation Plan: -INR Goal: 2.0 - 2.5 -ASA Dose: no ASA since hospitalization 04/29/17 for GI bleed; hx of gastritis  Gtts: Dobutamine  7.5 mcg/kg/min Heparin 1950 units/hr   Device: Medtronic BiV - DDD (lower rage 60)  -Therapies: off, end of service 08/2016  Blood products: 06/11/17 - 2 units PRBC's  Adverse Events on VAD: Significant Events on VAD Support:  - 2/27 - 12/06/16>hospitalized for BiV HF; discharged home on Mil 0.125 - 4/20 - 01/28/17>hospitalized for HF; tunneled PICC placed R subclavian; transitioned to Dob 5 (poor Co-ox on mil) - 6/1 - 03/08/17>hospitalized for R>L CHF; Dobutamine 5 - 7/9 - 04/16/17> hospitalized for altered mental status and HF. Psych eval; Dobutamine increased to 6 - 7/27 - 05/09/17>Hospitalization with melena and recurrent HF. EGD shoed ulcers in stomach. Dobutamine increased        from 6 to 7.5 - 8/28 - 06/03/17>hospitalization with melena and planned IP capsule- negative    Plan/Recommendations:   1. Weekly dressing change - due Tues 06/14/17. 2. Call VAD pager if any questions about VAD equipment or concerns  Balinda Quails RN, [place] Coordinator 24/7 pager [phone]

## 2017-06-13 NOTE — Progress Notes (Signed)
ANTICOAGULATION CONSULT NOTE - [place] for heparin and warfarin Indication: LVAD  Allergies  Allergen Reactions  . Codeine Nausea And Vomiting and Other (See Comments)    In high doses  . Trazodone And Nefazodone Other (See Comments)    Dizziness with subsequent fall   . Lipitor [Atorvastatin] Nausea Only and Other (See Comments)    Nausea with high doses, tolerates 20mg  dose (08/22/16)  . Tape Other (See Comments)    Paper tape is ok    Vital Signs: Temp: 98.2 F (36.8 C) (09/10 0500) Temp Source: Oral (09/10 0500) BP: 138/74 (09/10 0500) Pulse Rate: 80 (09/10 0500)  Labs:  Recent Labs  06/11/17 0730 06/11/17 0900  06/12/17 0500 06/12/17 1243 06/13/17 0520  HGB 7.7*  --   --  8.5*  --  8.3*  HCT 24.4*  --   --  27.4*  --  26.6*  PLT 171  --   --  160  --  145*  LABPROT  --  16.6*  --  15.3*  --  17.2*  INR  --  1.36  --  1.22  --  1.41  HEPARINUNFRC  --  0.25*  < > 0.37 0.45 0.33  CREATININE 1.54*  --   --  1.40*  --  1.23  < > = values in this interval not displayed.  Estimated Creatinine Clearance: 74.6 mL/min (by C-G formula based on SCr of 1.23 mg/dL).  Medical History: Past Medical History:  Diagnosis Date  . AICD (automatic cardioverter/defibrillator) present   . ASCVD (arteriosclerotic cardiovascular disease)   . Chronic systolic CHF (congestive heart failure) ([place])   . COPD (chronic obstructive pulmonary disease) ([place])   . Coronary artery disease   . Depression   . Diabetes mellitus   . History of cocaine abuse   . Hypertension   . Presence of permanent cardiac pacemaker   . Shortness of breath dyspnea   . Suicidal ideation   . Tobacco abuse     Infusions:  . sodium chloride Stopped (06/11/17 0002)  . DOBUTamine 7.5 mcg/kg/min (06/12/17 2245)  . heparin 1,950 Units/hr (06/12/17 1800)    Assessment: 61yoM with LVAD on Coumadin PTA presents with weakness and confusion, found to have subtherapeutic INR at 1.51 and  started on heparin bridge 9/7.   Heparin currently therapeutic at 0.33, INR subtherapeutic at 1.4 now trending up, Hgb stable this morning.  PTA Coumadin Dose = 5mg  Tues, 10mg  all other days  Goal of Therapy:  INR 2-2.5 Heparin level 0.3-0.5 units/ml Monitor platelets by anticoagulation protocol: Yes   Plan:  -Continue heparin 1950 units/hr -Coumadin 15mg  PO x1  -Check heparin level, INR, CBC daily  Erin Hearing PharmD., BCPS Clinical Pharmacist Pager [phone] 06/13/2017 7:28 AM

## 2017-06-13 NOTE — Progress Notes (Signed)
Pt complaining of abdominal pain 4/10. LBM 9/7. Active bowel sounds, abdomen distended, passing gas and burping. MD paged. MD modified miralax order to BID prn. Miralax given.   Update 2254 Pt having nausea after miralax "stated it pushed him over the edge", pt having increased abdomen pain, pt stated he doesn't think anything is moving around in there" . Pt requesting a scan of his abdomen. MD paged. 2 view of abdomen ordered via verbal order from MD. Will continue to monitor.

## 2017-06-13 NOTE — Progress Notes (Signed)
Advanced Heart Failure VAD Team Note  Subjective:    Feeling better symptomatically, but frustrated to be in the hospital and have "IVs in". Feels like he cant do anything or go anywhere.  He is firm on the fact that he "forgot" to re-connect his dobutamine, he did not leave it off intentionally for a holiday.   Hgb 8.3 after 1 U PRBC 9/7 and 06/11/17  Coox 68.4% on dobutamine 7.5 mcg/kg/min.   LVAD INTERROGATION:  HeartMate II LVAD:  Flow 5.5 liters/min, speed 5900, power 5.0, PI 3.0. Occasional PI events.    Objective:    Vital Signs:   Temp:  [97.4 F (36.3 C)-98.7 F (37.1 C)] 98.7 F (37.1 C) (09/10 0755) Pulse Rate:  [60-80] 67 (09/10 0755) Resp:  [14-20] 14 (09/10 0400) BP: (62-138)/(23-84) 138/74 (09/10 0500) SpO2:  [94 %-100 %] 98 % (09/10 0755) Weight:  [221 lb 9 oz (100.5 kg)] 221 lb 9 oz (100.5 kg) (09/10 0243) Last BM Date: 06/10/17 Mean arterial Pressure 80-90s   Intake/Output:   Intake/Output Summary (Last 24 hours) at 06/13/17 0803 Last data filed at 06/13/17 0700  Gross per 24 hour  Intake          1868.41 ml  Output             3050 ml  Net         -1181.59 ml     Physical Exam    General: Chronically ill appearing. NAD.  HEENT: Normal Neck: Supple. JVP difficult to see but appears ~8-9 cm. Carotids 2+ bilat; no bruits. No thyromegaly or nodule noted. Cor: PMI nondisplaced. RRR, No M/G/R noted. LVAD hum. Lungs: CTAB, normal effort. Abdomen: Soft, non-tender, non-distended, no HSM. No bruits or masses. +BS Driveline site clear. Anchor in place.  Extremities: No cyanosis, clubbing, or rash. Trace ankle edema.  Neuro: Alert & orientedx3, cranial nerves grossly intact. moves all 4 extremities w/o difficulty. Affect flat but appropriate.   Telemetry   NSR with V pacing 70s, Personally reviewed.   EKG   N/A  Labs   Basic Metabolic Panel:  Recent Labs Lab 06/10/17 1940 06/11/17 0730 06/12/17 0500 06/13/17 0520  NA 130* 129* 131* 133*    K 4.4 4.5 4.2 3.8  CL 93* 92* 94* 96*  CO2 28 30 31  32  GLUCOSE 242* 161* 154* 120*  BUN 49* 40* 31* 22*  CREATININE 1.82* 1.54* 1.40* 1.23  CALCIUM 8.3* 7.5* 8.5* 8.4*    Liver Function Tests:  Recent Labs Lab 06/10/17 1940  AST 19  ALT 19  ALKPHOS 76  BILITOT 0.9  PROT 6.7  ALBUMIN 3.4*    Recent Labs Lab 06/10/17 1940  LIPASE 30    Recent Labs Lab 06/10/17 2010  AMMONIA 27    CBC:  Recent Labs Lab 06/07/17 1136 06/10/17 1940 06/11/17 0730 06/12/17 0500 06/13/17 0520  WBC 9.1 10.5 8.1 7.5 7.1  NEUTROABS  --  9.3*  --   --   --   HGB 8.3* 7.6* 7.7* 8.5* 8.3*  HCT 25.7* 23.8* 24.4* 27.4* 26.6*  MCV 84.8 85.0 86.5 86.7 86.9  PLT 246 195 171 160 145*    INR:  Recent Labs Lab 06/07/17 1136 06/10/17 1940 06/11/17 0900 06/12/17 0500 06/13/17 0520  INR 2.64 1.51 1.36 1.22 1.41    Other results:     Imaging   No results found.   Medications:     Scheduled Medications: . amiodarone  200 mg Oral Daily  .  citalopram  20 mg Oral Daily  . digoxin  0.125 mg Oral Daily  . docusate sodium  200 mg Oral QHS  . gabapentin  200 mg Oral BID  . insulin aspart  0-15 Units Subcutaneous TID WC  . insulin aspart  0-5 Units Subcutaneous QHS  . insulin aspart  6 Units Subcutaneous TID WC  . insulin glargine  10 Units Subcutaneous Q2200  . magnesium oxide  400 mg Oral Daily  . multivitamin with minerals  1 tablet Oral Daily  . pantoprazole  40 mg Oral BID AC  . sodium chloride flush  10-40 mL Intracatheter Q12H  . spironolactone  12.5 mg Oral Daily  . sucralfate  1 g Oral TID WC & HS  . torsemide  50 mg Oral BID  . Warfarin - Pharmacist Dosing Inpatient   Does not apply q1800    Infusions: . sodium chloride Stopped (06/11/17 0002)  . DOBUTamine 7.5 mcg/kg/min (06/12/17 2245)  . heparin 1,950 Units/hr (06/12/17 1800)    PRN Medications: acetaminophen, ondansetron (ZOFRAN) IV, sodium chloride flush, traMADol, zolpidem   Patient Profile    Ronald Miller. is a 61 y.o. male  with history of chronic systolic HF s/p Medtronic ICD 2014, CAD s/p CABG in 2000, HTN, Hx of cocaine abuse, Tobacco abuse, Depression, PAF, and COPD. HM3 placed 09/09/2016. Admitted with weakness, nausea and pre-syncope.   Assessment/Plan:    1. Nausea/Weakness/pre-syncope - Unclear etiology. Now resolved. ? Dysrhythmia or due to being off dobutamine - ICD interrogated on admit. No high-rate episodes.   2. Chronic end stage biventricular systolic HF: LVEF 19%, ICM. S/p HM3 LVAD 41/04/4080.  - Complicated by RV failure - Chronically on dobutamine 7.5 mcg at home however he wasn't wearing it when event happened last night. - Co-ox improved back on steady dose of dobutamine. Consider need repeat ramp echo - Back on po diuretics. AKI resolved.  - Has been turned down by [facility] for transplant.  - No beta blocker with RV failure. - Continue digoxin 0.125 mcg  3. LVAD - PI low with multi PI events and power disconnects. Now improving with RBCs.  - Suspect volume depletion +/- GI bleed with low hgb.  - INR low at 1.5 on admit. todays INR 1.41. On heparin/coumadin per pharm.  - LDH OK.  - VAD parameters checked personally.   4. Acute on chronic renal failure, stage 3 - Improved. Back on po diuretics.   5. Iron deficiency anemia - has h/o acute gastritis on EGD. Recent capsule normal - Rec'd 2u RBCS so far this admit with minimal bump 7.6->8.5. No obvious bleeding on exam  - Iron stores low. Received dose of Feraheme 06/12/17.   6. CAD: Severe 3 vessel CAD s/p CABG with occluded grafts except for LIMA.  - Denies chest pain.   7. DM - SSI - Continue lantus hs  8. PAF - In NSR. Continue Amio and warfarin   I reviewed the LVAD parameters from today, and compared the results to the patient's prior recorded data.  No programming changes were made.  The LVAD is functioning within specified parameters.  The patient performs LVAD self-test daily.   LVAD interrogation was negative for any significant power changes, alarms or PI events/speed drops.  LVAD equipment check completed and is in good working order.  Back-up equipment present.   LVAD education done on emergency procedures and precautions and reviewed exit site care.   Length of Stay: 3  Ronald Friar, [place]  06/13/2017, 8:03 AM  VAD Team --- VAD ISSUES ONLY--- Pager [phone] (7am - 7am) Advanced Heart Failure Team  Pager [phone] (M-F; 7a - 4p)  Please contact [facility] for night-coverage after hours (4p -7a ) and weekends on [email or web address]  Patient seen and examined with the above-signed Advanced Practice Provider and/or Housestaff. I personally reviewed laboratory data, imaging studies and relevant notes. I independently examined the patient and formulated the important aspects of the plan. I have edited the note to reflect any of my changes or salient points. I have personally discussed the plan with the patient and/or family.  Volume status looks good. Co-ox improved on dobutamine. Hgb stable. Continue heparin/warfarin. Home when INR >= 1.8.  VAD parameters stable.  Glori Bickers, MD  9:26 AM

## 2017-06-13 NOTE — Progress Notes (Signed)
[facility]  Active pt with [facility] prior to this readmission.  [facility] team will follow pt during this stay to support home care needs at DC.  If patient discharges after hours, please call [phone].   [name] 06/13/2017, 9:38 AM

## 2017-06-14 LAB — GLUCOSE, CAPILLARY
GLUCOSE-CAPILLARY: 106 mg/dL — AB (ref 65–99)
GLUCOSE-CAPILLARY: 136 mg/dL — AB (ref 65–99)
Glucose-Capillary: 89 mg/dL (ref 65–99)
Glucose-Capillary: 144 mg/dL — ABNORMAL HIGH (ref 65–99)

## 2017-06-14 LAB — BASIC METABOLIC PANEL
ANION GAP: 6 (ref 5–15)
BUN: 20 mg/dL (ref 6–20)
CHLORIDE: 92 mmol/L — AB (ref 101–111)
CO2: 34 mmol/L — ABNORMAL HIGH (ref 22–32)
Calcium: 8.5 mg/dL — ABNORMAL LOW (ref 8.9–10.3)
Creatinine, Ser: 1.22 mg/dL (ref 0.61–1.24)
GFR calc Af Amer: >60 mL/min (ref >60)
GFR calc non Af Amer: >60 mL/min (ref >60)
GLUCOSE: 100 mg/dL — AB (ref 65–99)
POTASSIUM: 3.7 mmol/L (ref 3.5–5.1)
SODIUM: 132 mmol/L — AB (ref 135–145)

## 2017-06-14 LAB — CBC
HEMATOCRIT: 28 % — AB (ref 39.0–52.0)
HEMOGLOBIN: 8.6 g/dL — AB (ref 13.0–17.0)
MCH: 26.9 pg (ref 26.0–34.0)
MCHC: 30.7 g/dL (ref 30.0–36.0)
MCV: 87.5 fL (ref 78.0–100.0)
Platelets: 158 10*3/uL (ref 150–400)
RBC: 3.2 MIL/uL — ABNORMAL LOW (ref 4.22–5.81)
RDW: 18.1 % — AB (ref 11.5–15.5)
WBC: 6.2 10*3/uL (ref 4.0–10.5)

## 2017-06-14 LAB — HEPARIN LEVEL (UNFRACTIONATED): Heparin Unfractionated: 0.25 IU/mL — ABNORMAL LOW (ref 0.30–0.70)

## 2017-06-14 LAB — COOXEMETRY PANEL
Carboxyhemoglobin: 1.9 % — ABNORMAL HIGH (ref 0.5–1.5)
Methemoglobin: 1.3 % (ref 0.0–1.5)
O2 SAT: 64.3 %
TOTAL HEMOGLOBIN: 8.7 g/dL — AB (ref 12.0–16.0)

## 2017-06-14 LAB — PROTIME-INR
INR: 1.53
Prothrombin Time: 18.2 seconds — ABNORMAL HIGH (ref 11.4–15.2)

## 2017-06-14 LAB — LACTATE DEHYDROGENASE: LDH: 157 U/L (ref 98–192)

## 2017-06-14 LAB — MAGNESIUM: Magnesium: 2.1 mg/dL (ref 1.7–2.4)

## 2017-06-14 MED ORDER — POTASSIUM CHLORIDE CRYS ER 20 MEQ PO TBCR
40.0000 meq | EXTENDED_RELEASE_TABLET | Freq: Once | ORAL | Status: AC
  Administered 2017-06-14: 40 meq via ORAL
  Filled 2017-06-14: qty 2

## 2017-06-14 MED ORDER — SORBITOL 70 % SOLN
30.0000 mL | Freq: Once | Status: AC
  Administered 2017-06-14: 30 mL via ORAL
  Filled 2017-06-14: qty 30

## 2017-06-14 MED ORDER — WARFARIN SODIUM 7.5 MG PO TABS
15.0000 mg | ORAL_TABLET | Freq: Once | ORAL | Status: AC
  Administered 2017-06-14: 15 mg via ORAL
  Filled 2017-06-14: qty 2

## 2017-06-14 NOTE — Progress Notes (Signed)
ANTICOAGULATION CONSULT NOTE - Follow Up Consult  Pharmacy Consult for heparin Indication: LVAD  Labs:  Recent Labs  06/12/17 0500 06/12/17 1243 06/13/17 0520 06/14/17 0351  HGB 8.5*  --  8.3* 8.6*  HCT 27.4*  --  26.6* 28.0*  PLT 160  --  145* 158  LABPROT 15.3*  --  17.2* 18.2*  INR 1.22  --  1.41 1.53  HEPARINUNFRC 0.37 0.45 0.33 0.25*  CREATININE 1.40*  --  1.23 1.22    Assessment: 61yo male now below goal on heparin; no gtt issues overnight though RN notes some epistaxis earlier this shift that resolved on its own.  Goal of Therapy:  Heparin level 0.3-0.5 units/ml   Plan:  Will increase heparin gtt slightly to 2000 units/hr and check level in [place], PharmD, BCPS  06/14/2017,5:00 AM

## 2017-06-14 NOTE — Progress Notes (Signed)
Advanced Heart Failure VAD Team Note  Subjective:    Feeling good this morning. Anxious to go home. Primary complaint is constipation. Mild SOB walking halls.   Hgb 8.3 after 1 U PRBC 9/7 and 06/11/17  Coox 64.3% on dobutamine 7.5 mcg/kg/min.  Negative 2.9 L and down 2 lbs  INR 1.53 LDH 157  LVAD INTERROGATION:  HeartMate II LVAD:  Flow 5.4 liters/min, speed 5900, power 5.0, PI 2.6. No PI events.   Objective:    Vital Signs:   Temp:  [97.3 F (36.3 C)-97.8 F (36.6 C)] 97.4 F (36.3 C) (09/11 0329) Pulse Rate:  [65-67] 67 (09/10 1600) Resp:  [14-20] 17 (09/11 0329) BP: (95-110)/(60-86) 110/86 (09/11 0329) SpO2:  [96 %-98 %] 97 % (09/11 0329) Weight:  [219 lb 2.2 oz (99.4 kg)] 219 lb 2.2 oz (99.4 kg) (09/11 0500) Last BM Date: 06/10/17 Mean arterial Pressure 96 this am.   Intake/Output:   Intake/Output Summary (Last 24 hours) at 06/14/17 0758 Last data filed at 06/14/17 0450  Gross per 24 hour  Intake           2041.4 ml  Output             4975 ml  Net          -2933.6 ml     Physical Exam    General: Chronically ill appearing. NAD.  HEENT: Normal Neck: Supple. JVP difficult but appears ~ 7-8 cm. Carotids 2+ bilat; no bruits. No thyromegaly or nodule noted. Cor: PMI nondisplaced. RRR, No M/G/R noted. + LVAD hum. Lungs: CTAB, normal effort. Abdomen: Soft, non-tender, non-distended, no HSM. No bruits or masses. +BS. Driveline site clear. Anchor in place.  Extremities: No cyanosis, clubbing, rash, R and LLE no edema.  Neuro: Alert & orientedx3, cranial nerves grossly intact. moves all 4 extremities w/o difficulty. Affect pleasant   Telemetry   NSR with V pacing 70s, personally reviewed.   EKG   N/A  Labs   Basic Metabolic Panel:  Recent Labs Lab 06/10/17 1940 06/11/17 0730 06/12/17 0500 06/13/17 0520 06/14/17 0351  NA 130* 129* 131* 133* 132*  K 4.4 4.5 4.2 3.8 3.7  CL 93* 92* 94* 96* 92*  CO2 28 30 31  32 34*  GLUCOSE 242* 161* 154* 120* 100*    BUN 49* 40* 31* 22* 20  CREATININE 1.82* 1.54* 1.40* 1.23 1.22  CALCIUM 8.3* 7.5* 8.5* 8.4* 8.5*  MG  --   --   --   --  2.1    Liver Function Tests:  Recent Labs Lab 06/10/17 1940  AST 19  ALT 19  ALKPHOS 76  BILITOT 0.9  PROT 6.7  ALBUMIN 3.4*    Recent Labs Lab 06/10/17 1940  LIPASE 30    Recent Labs Lab 06/10/17 2010  AMMONIA 27    CBC:  Recent Labs Lab 06/10/17 1940 06/11/17 0730 06/12/17 0500 06/13/17 0520 06/14/17 0351  WBC 10.5 8.1 7.5 7.1 6.2  NEUTROABS 9.3*  --   --   --   --   HGB 7.6* 7.7* 8.5* 8.3* 8.6*  HCT 23.8* 24.4* 27.4* 26.6* 28.0*  MCV 85.0 86.5 86.7 86.9 87.5  PLT 195 171 160 145* 158    INR:  Recent Labs Lab 06/10/17 1940 06/11/17 0900 06/12/17 0500 06/13/17 0520 06/14/17 0351  INR 1.51 1.36 1.22 1.41 1.53    Other results:     Imaging   Dg Abd 2 Views  Result Date: 06/14/2017 CLINICAL DATA:  Abdominal pain,  constipation and distention for 4 days. EXAM: ABDOMEN - 2 VIEW COMPARISON:  Single view of the abdomen 01/22/2017. FINDINGS: No free intraperitoneal air is identified. There is a large volume of stool in the ascending and transverse colon. No evidence of bowel obstruction. IMPRESSION: No acute abnormality. Large stool burden ascending and transverse colon. Electronically Signed   By: Inge Rise M.D.   On: 06/14/2017 00:08     Medications:     Scheduled Medications: . amiodarone  200 mg Oral Daily  . citalopram  20 mg Oral Daily  . digoxin  0.125 mg Oral Daily  . docusate sodium  200 mg Oral QHS  . gabapentin  200 mg Oral BID  . insulin aspart  0-15 Units Subcutaneous TID WC  . insulin aspart  0-5 Units Subcutaneous QHS  . insulin aspart  6 Units Subcutaneous TID WC  . insulin glargine  10 Units Subcutaneous Q2200  . magnesium oxide  400 mg Oral Daily  . multivitamin with minerals  1 tablet Oral Daily  . pantoprazole  40 mg Oral BID AC  . sodium chloride flush  10-40 mL Intracatheter Q12H  .  spironolactone  12.5 mg Oral Daily  . sucralfate  1 g Oral TID WC & HS  . torsemide  50 mg Oral BID  . Warfarin - Pharmacist Dosing Inpatient   Does not apply q1800    Infusions: . sodium chloride Stopped (06/11/17 0002)  . DOBUTamine 7.5 mcg/kg/min (06/13/17 2147)  . heparin 2,000 Units/hr (06/14/17 0506)    PRN Medications: acetaminophen, ondansetron (ZOFRAN) IV, polyethylene glycol, sodium chloride flush, traMADol, zolpidem   Patient Profile   Ronald Miller. is a 61 y.o. male  with history of chronic systolic HF s/p Medtronic ICD 2014, CAD s/p CABG in 2000, HTN, Hx of cocaine abuse, Tobacco abuse, Depression, PAF, and COPD. HM3 placed 09/09/2016. Admitted with weakness, nausea and pre-syncope.   Assessment/Plan:    1. Nausea/Weakness/pre-syncope - Unclear etiology. Now resolved. ? Dysrhythmia or due to being off dobutamine - ICD interrogated on admit. No high-rate episodes.   2. Chronic end stage biventricular systolic HF: LVEF 27%, ICM. S/p HM3 LVAD 74/10/2876.  - Complicated by RV failure - Chronically on dobutamine 7.5 mcg at home however he wasn't wearing it when event happened last night. - Co-ox stable on dobutamine. Consider  repeat ramp echo - Back on po diuretics. AKI resolved. Fluid status looks OK.  - Has been turned down by [facility] for transplant.  - No beta blocker with RV failure. - Continue digoxin 0.125 mcg  3. LVAD - PI low with multi PI events and power disconnects. Now improving with RBCs.  - Suspect volume depletion +/- GI bleed with low hgb.  - INR low but trending up to 1.53. Continue heparin until > 1.8at 1.5 on admit.  - LDH OK.  - VAD parameters checked personally. Stable.   4. Acute on chronic renal failure, stage 3 - Resolved. Back on po diuretics.   5. Iron deficiency anemia - has h/o acute gastritis on EGD. Recent capsule normal - Rec'd 2u RBCS so far this admit with minimal bump 7.6->8.5.  - Hgb stable at 8.6 this am.  - Iron stores  low. Received dose of Feraheme 06/12/17.   6. CAD: Severe 3 vessel CAD s/p CABG with occluded grafts except for LIMA.  - Denies chest pain.   7. DM - Continue SSI and lantus HS.   8. PAF - In NSR. Continue amio  and warfarin.   9. Constipation - Continue miralax. Give Sorbitol today. Repeat as needed.   I reviewed the LVAD parameters from today, and compared the results to the patient's prior recorded data.  No programming changes were made.  The LVAD is functioning within specified parameters.  The patient performs LVAD self-test daily.  LVAD interrogation was negative for any significant power changes, alarms or PI events/speed drops.  LVAD equipment check completed and is in good working order.  Back-up equipment present.   LVAD education done on emergency procedures and precautions and reviewed exit site care.   Length of Stay: 4  Ronald Miller 06/14/2017, 7:58 AM  VAD Team --- VAD ISSUES ONLY--- Pager [phone] (7am - 7am) Advanced Heart Failure Team  Pager [phone] (M-F; 7a - 4p)  Please contact [facility] for night-coverage after hours (4p -7a ) and weekends on [email or web address]  Patient seen and examined with the above-signed Advanced Practice Provider and/or Housestaff. I personally reviewed laboratory data, imaging studies and relevant notes. I independently examined the patient and formulated the important aspects of the plan. I have edited the note to reflect any of my changes or salient points. I have personally discussed the plan with the patient and/or family.  Volume status, hgb and VAD parameters stable. INR still low. Remains on heparin and warfarin.   Home when INR >= 1.8.   Ronald Bickers, MD  3:20 PM

## 2017-06-14 NOTE — Progress Notes (Signed)
Received page from [facility], Case Manager, expressing concern that patient had disconnected Dobutamine PTA and has made statements to the nursing staff such as "I don't see the point in this anymore". Personally visited patient who denied any plan to harm himself or disconnect equipment. States he occasionally feels frustrated that he "has had a difficult road" after VAD implant. Offered emotional support. Reinforced  that he needs to keep Dobutamine drip connected at all times to maximize benefit and minimize risk for line infection/sepsis. Discussed VAD equipment and switching from power module to external batteries during waking/sleeping hours as appropriate. Patient stated he is planning to return home with his sister at discharge. Discussed with heart failure social worker, Raquel Sarna who plans to visit patient soon.   Balinda Quails RN, VAD Coordinator 24/7 pager [phone]

## 2017-06-14 NOTE — Care Management Note (Signed)
Case Management Note  Patient Details  Name: Ronald Miller. MRN: [id number] Date of Birth: 1955/11/04  Subjective/Objective:     Pt admitted with emesis             Action/Plan:  PTA independent from home - active with [facility] for IV inotrope.  Pt  previously discharged to sisters.  Pt has hx of LVAD - followed closely by the LVAD/HF team.  CM spoke with LVAD coordinator and relayed concerns regarding pts emotional health - LVAD CSW to follow up.  CM will continue to follow for discharge needs   Expected Discharge Date:                  Expected Discharge Plan:  [place]  In-House Referral:     Discharge planning Services  CM Consult  Post Acute Care Choice:    Choice offered to:     DME Arranged:    DME Agency:     HH Arranged:    HH Agency:     Status of Service:     If discussed at [employer], dates discussed:    Additional Comments:  Maryclare Labrador, RN 06/14/2017, 3:10 PM

## 2017-06-14 NOTE — Progress Notes (Signed)
LVAD Coordinator Rounding Note:  Admitted 9/7/18due to nausea, weakness/pre-syncope.   HM III LVAD implated on 09/09/16 by Dr. Nancy Nordmann Destination Therapycriteria.  Would like his peripheral IV removed- discussed with the nurse. Patient understands he will be stuck for lab draws while Heparin is infusing in the PICC.  Vital signs: HR: 71 Doppler Pressure: 80 Automatic BP: 110/86 (96) O2 Sat: 96% on RA Wt in lbs: 219>217>220>221>219   LVAD interrogation reveals:   Speed: 5900 Flow: 5.4 Power: 5.0w PI: 2.6 Alarms: none in last 24 hours Events: none in 24 hours Hematocrit: 28 Fixed speed: 5900 Low speed limit: 5600  Drive Line: unremarkable. Due to be changed tuesdays using weekly kit.  Labs:  LDH trend: 187>180>163>157  INR trend: 1.5>1.36>1.41>1.53  Anticoagulation Plan: -INR Goal: 2.0 - 2.5 -ASA Dose: no ASA since hospitalization 04/29/17 for GI bleed; hx of gastritis  Gtts: Dobutamine  7.5 mcg/kg/min Heparin 2000 units/hr   Device: Medtronic BiV - DDD (lower rage 60)  -Therapies: off, end of service 08/2016  Blood products: 06/11/17 - 2 units PRBC's  Adverse Events on VAD: Significant Events on VAD Support:  - 2/27 - 12/06/16>hospitalized for BiV HF; discharged home on Mil 0.125 - 4/20 - 01/28/17>hospitalized for HF; tunneled PICC placed R subclavian; transitioned to Dob 5 (poor Co-ox on mil) - 6/1 - 03/08/17>hospitalized for R>L CHF; Dobutamine 5 - 7/9 - 04/16/17> hospitalized for altered mental status and HF. Psych eval; Dobutamine increased to 6 - 7/27 - 05/09/17>Hospitalization with melena and recurrent HF. EGD shoed ulcers in stomach. Dobutamine increased from 6 to 7.5 - 8/28 - 06/03/17>hospitalization with melena and planned IP capsule- negative    Plan/Recommendations:   1. Weekly dressing change - due today. Asked RN to complete. 2. Call VAD pager if any questions about VAD equipment or concerns   Balinda Quails  RN, [place] Coordinator 24/7 pager [phone]

## 2017-06-15 ENCOUNTER — Inpatient Hospital Stay (HOSPITAL_COMMUNITY): Admission: RE | Admit: 2017-06-15 | Payer: Self-pay | Source: Ambulatory Visit

## 2017-06-15 ENCOUNTER — Telehealth (HOSPITAL_COMMUNITY): Payer: Self-pay | Admitting: *Deleted

## 2017-06-15 LAB — BASIC METABOLIC PANEL
Anion gap: 7 (ref 5–15)
BUN: 19 mg/dL (ref 6–20)
CHLORIDE: 91 mmol/L — AB (ref 101–111)
CO2: 34 mmol/L — ABNORMAL HIGH (ref 22–32)
Calcium: 9 mg/dL (ref 8.9–10.3)
Creatinine, Ser: 1.35 mg/dL — ABNORMAL HIGH (ref 0.61–1.24)
GFR calc non Af Amer: 56 mL/min — ABNORMAL LOW (ref >60)
Glucose, Bld: 145 mg/dL — ABNORMAL HIGH (ref 65–99)
Potassium: 4.3 mmol/L (ref 3.5–5.1)
Sodium: 132 mmol/L — ABNORMAL LOW (ref 135–145)

## 2017-06-15 LAB — GLUCOSE, CAPILLARY
GLUCOSE-CAPILLARY: 128 mg/dL — AB (ref 65–99)
GLUCOSE-CAPILLARY: 111 mg/dL — AB (ref 65–99)
Glucose-Capillary: 144 mg/dL — ABNORMAL HIGH (ref 65–99)

## 2017-06-15 LAB — CBC
HCT: 29.4 % — ABNORMAL LOW (ref 39.0–52.0)
HEMOGLOBIN: 8.9 g/dL — AB (ref 13.0–17.0)
MCH: 26.8 pg (ref 26.0–34.0)
MCHC: 30.3 g/dL (ref 30.0–36.0)
MCV: 88.6 fL (ref 78.0–100.0)
Platelets: 162 10*3/uL (ref 150–400)
RBC: 3.32 MIL/uL — AB (ref 4.22–5.81)
RDW: 18.7 % — ABNORMAL HIGH (ref 11.5–15.5)
WBC: 6 10*3/uL (ref 4.0–10.5)

## 2017-06-15 LAB — LACTATE DEHYDROGENASE: LDH: 166 U/L (ref 98–192)

## 2017-06-15 LAB — COOXEMETRY PANEL
CARBOXYHEMOGLOBIN: 1.5 % (ref 0.5–1.5)
METHEMOGLOBIN: 1.1 % (ref 0.0–1.5)
O2 Saturation: 54.9 %
Total hemoglobin: 12.2 g/dL (ref 12.0–16.0)

## 2017-06-15 LAB — PROTIME-INR
INR: 1.8
INR: 1.92
PROTHROMBIN TIME: 20.7 s — AB (ref 11.4–15.2)
Prothrombin Time: 21.8 seconds — ABNORMAL HIGH (ref 11.4–15.2)

## 2017-06-15 LAB — HEPARIN LEVEL (UNFRACTIONATED)
HEPARIN UNFRACTIONATED: 0.79 [IU]/mL — AB (ref 0.30–0.70)
HEPARIN UNFRACTIONATED: 0.39 [IU]/mL (ref 0.30–0.70)

## 2017-06-15 IMAGING — CR DG CHEST 2V
2 series · 2 of 2 positions shown · non-contrast
Comparison: 01/12/2016.

CLINICAL DATA: Shortness of breath.

EXAM:
CHEST  2 VIEW

[chest pa]
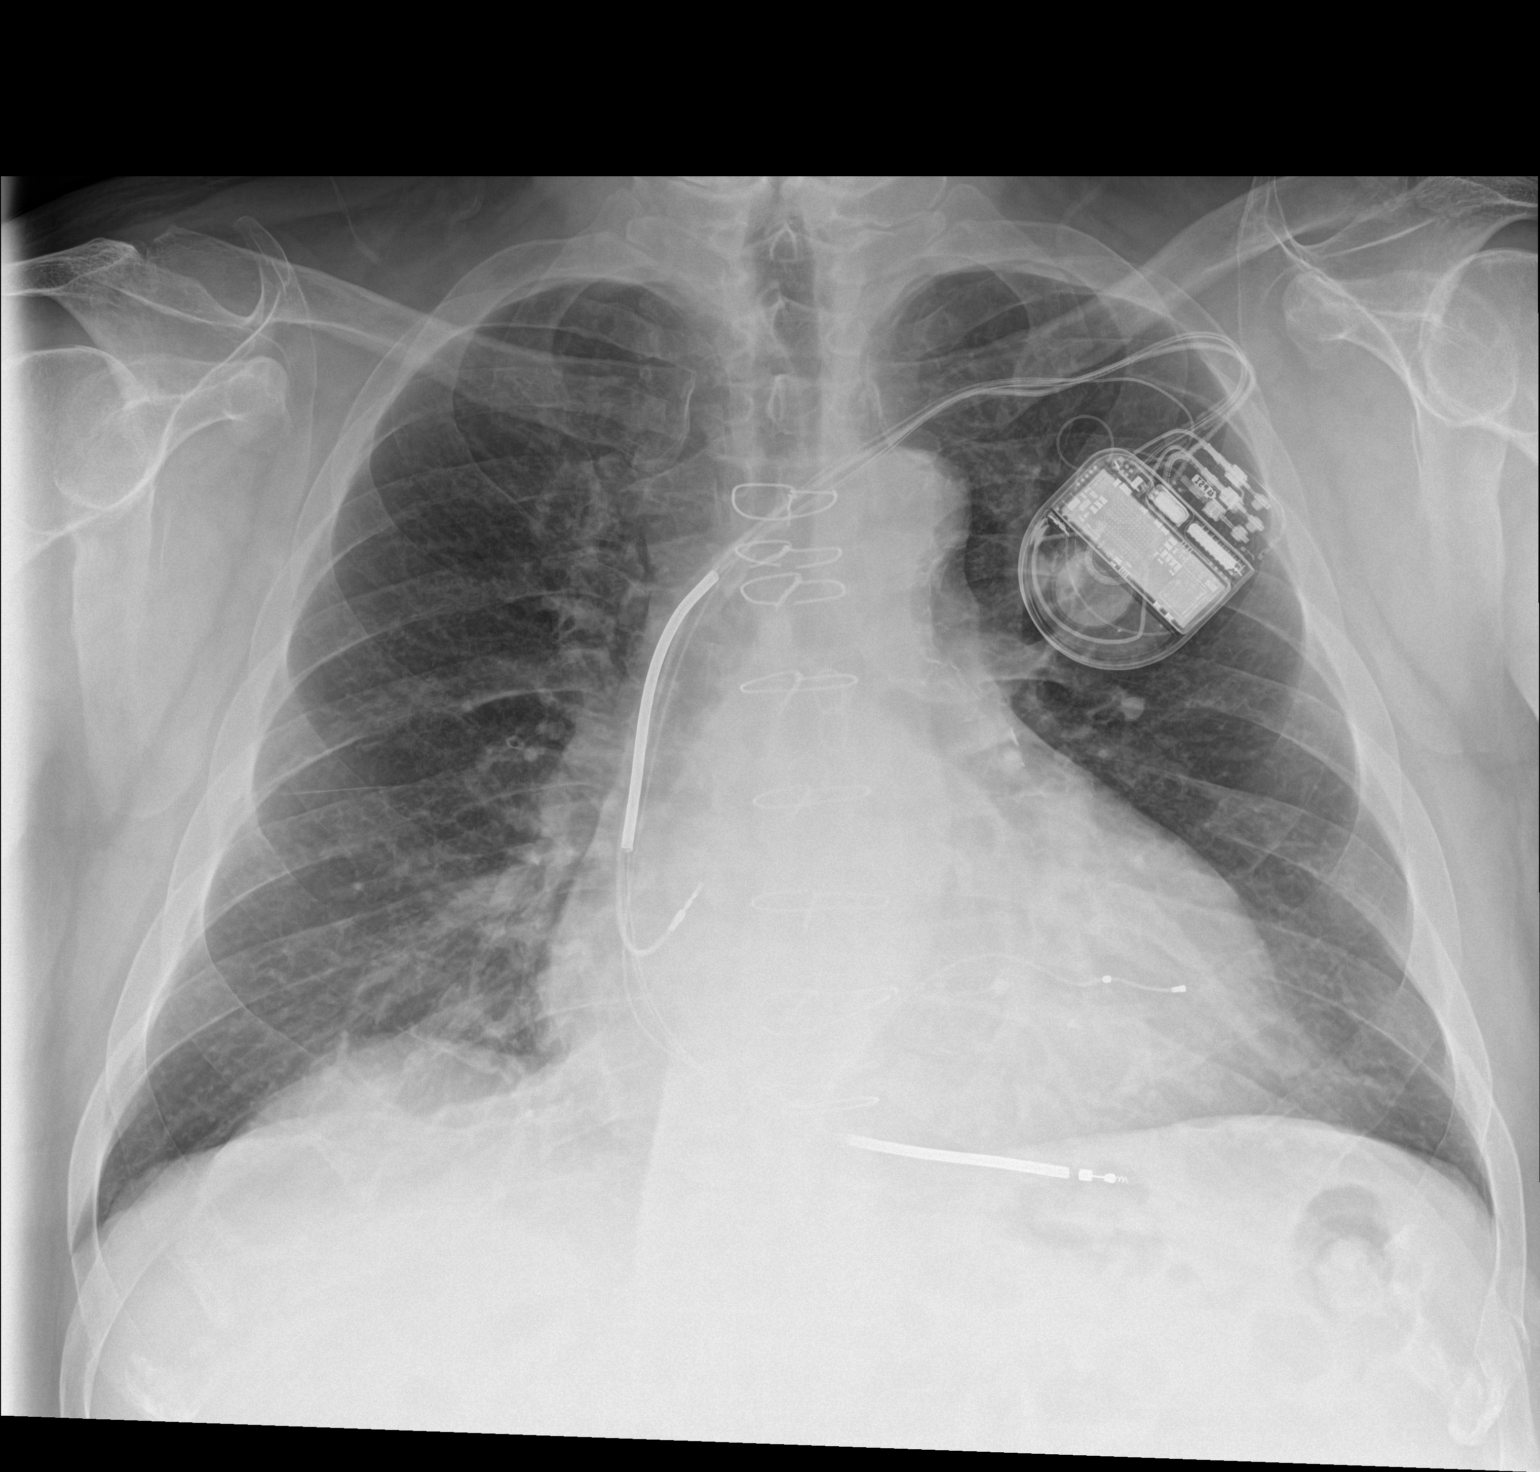

[chest lat]
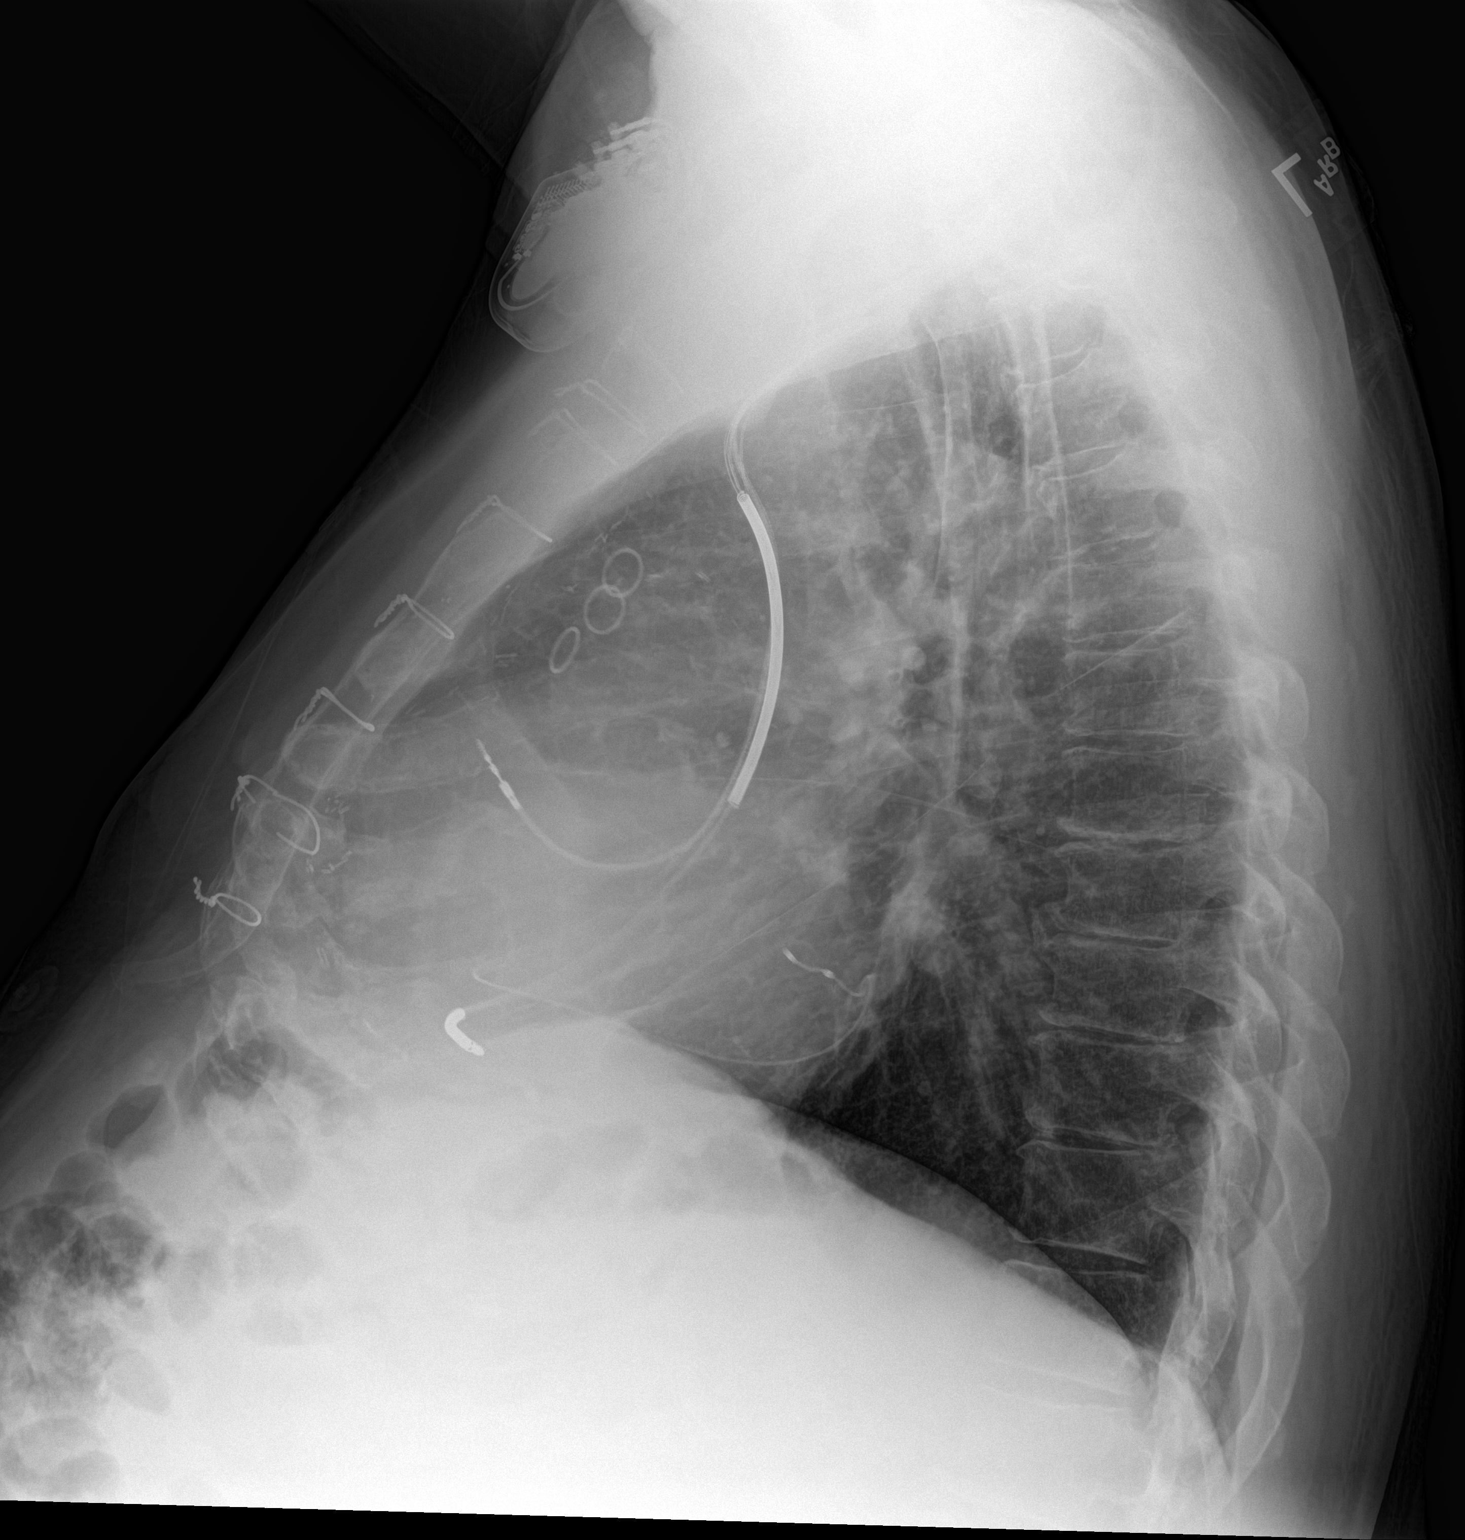

[2 of 2 positions shown; findings below may reference images not displayed]

FINDINGS: Prior CABG. AICD noted in stable position. Stable cardiomegaly. Low
lung volumes with mild basilar atelectasis. No pleural effusion or
pneumothorax. Degenerative changes thoracic spine .
IMPRESSION: 1.  AICD in stable position.  Prior CABG.  Stable cardiomegaly.

2.  Low lung volumes with mild basilar atelectasis.

## 2017-06-15 MED ORDER — POTASSIUM CHLORIDE CRYS ER 20 MEQ PO TBCR: 20.0000 meq | EXTENDED_RELEASE_TABLET | Freq: Two times a day (BID) | ORAL | 3 refills | Status: DC

## 2017-06-15 MED ORDER — WARFARIN SODIUM 5 MG PO TABS: 10.0000 mg | ORAL_TABLET | Freq: Every day | ORAL | 6 refills | Status: DC

## 2017-06-15 MED ORDER — WARFARIN SODIUM 10 MG PO TABS
10.0000 mg | ORAL_TABLET | Freq: Once | ORAL | Status: AC
  Administered 2017-06-15: 10 mg via ORAL
  Filled 2017-06-15: qty 1

## 2017-06-15 MED ORDER — TORSEMIDE 100 MG PO TABS: 50.0000 mg | ORAL_TABLET | Freq: Two times a day (BID) | ORAL | 3 refills | Status: DC

## 2017-06-15 MED ORDER — SPIRONOLACTONE 25 MG PO TABS: 12.5000 mg | ORAL_TABLET | Freq: Every day | ORAL | 6 refills | Status: DC

## 2017-06-15 NOTE — Progress Notes (Signed)
CSW familiar with patient from outpatient LVAD program. CSW met with patient to provide support and talk about added support at home and in the community. CSW discussed a trial program with [facility]. Patient would receive a visit weekly from a paramedic who would assist with Heart Failure education and medication review. Patient is open to plan and agreeable to referral for weekly visits. Patient reports he will be discharged this evening and his sister plans to pick him up to transport home. CSW will continue to follow up with patient through the VAD clinic and coordinate needs. Raquel Sarna, [place], [place]

## 2017-06-15 NOTE — Progress Notes (Signed)
ANTICOAGULATION CONSULT NOTE - [place] for heparin and warfarin Indication: LVAD  Allergies  Allergen Reactions  . Codeine Nausea And Vomiting and Other (See Comments)    In high doses  . Trazodone And Nefazodone Other (See Comments)    Dizziness with subsequent fall   . Lipitor [Atorvastatin] Nausea Only and Other (See Comments)    Nausea with high doses, tolerates 20mg  dose (08/22/16)  . Tape Other (See Comments)    Paper tape is ok    Vital Signs: Temp: 98.3 F (36.8 C) (09/12 0819) Temp Source: Oral (09/12 0819) BP: 98/81 (09/12 0819) Pulse Rate: 89 (09/12 0819)  Labs:  Recent Labs  06/13/17 0520 06/14/17 0351 06/15/17 0445 06/15/17 0545  HGB 8.3* 8.6* 8.9*  --   HCT 26.6* 28.0* 29.4*  --   PLT 145* 158 162  --   LABPROT 17.2* 18.2* 20.7*  --   INR 1.41 1.53 1.80  --   HEPARINUNFRC 0.33 0.25* 0.79* 0.39  CREATININE 1.23 1.22 1.35*  --     Estimated Creatinine Clearance: 67.6 mL/min (A) (by C-G formula based on SCr of 1.35 mg/dL (H)).  Medical History: Past Medical History:  Diagnosis Date  . AICD (automatic cardioverter/defibrillator) present   . ASCVD (arteriosclerotic cardiovascular disease)   . Chronic systolic CHF (congestive heart failure) ([place])   . COPD (chronic obstructive pulmonary disease) ([place])   . Coronary artery disease   . Depression   . Diabetes mellitus   . History of cocaine abuse   . Hypertension   . Presence of permanent cardiac pacemaker   . Shortness of breath dyspnea   . Suicidal ideation   . Tobacco abuse     Infusions:  . sodium chloride Stopped (06/11/17 0002)  . DOBUTamine 7.5 mcg/kg/min (06/14/17 2250)    Assessment: Ronald Miller with LVAD on Coumadin PTA presents with weakness and confusion, found to have subtherapeutic INR at 1.51 and started on heparin bridge 9/7.   Heparin currently therapeutic at 0.39. INR just below goal at 1.8. Patient has reached threshold to stop heparin so will do that  this am. Hgb continues to be stable, no bleeding noted.  PTA Coumadin Dose = 5mg  Tues, 10mg  all other days  Goal of Therapy:  INR 2-2.5 Heparin level 0.3-0.5 units/ml Monitor platelets by anticoagulation protocol: Yes   Plan:  Will stop heparin now  Recommended home dose of warfarin at discharge>>10mg  daily with planned INR check on Friday (If possible/weather permitting) if unable, clinic will see him for labs on Monday 9/17.  Erin Hearing PharmD., BCPS Clinical Pharmacist Pager [phone] 06/15/2017 9:12 AM
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
## 2017-06-15 NOTE — Progress Notes (Signed)
LVAD Coordinator Rounding Note:  Admitted 9/7/18due to nausea, weakness/pre-syncope.   HM III LVAD implated on 09/09/16 by Dr. Nancy Nordmann Destination Therapycriteria.   Vital signs: HR: 71 Doppler Pressure: 80 Automatic BP: 110/86 (96) O2 Sat: 96% on RA Wt in lbs: 219>217>220>221>219   LVAD interrogation reveals:   Speed: 5900 Flow: 5.4 Power: 5.0w PI: 2.6 Alarms: none in last 24 hours Events: 15 PI events overnight Hematocrit: 29 Fixed speed: 5900 Low speed limit: 5600  Drive Line: unremarkable. Due to be changed tuesdays using weekly kit.  Labs:  LDH trend: 187>180>163>157>166  INR trend: 1.5>1.36>1.41>1.53>1.80  Anticoagulation Plan: -INR Goal: 2.0 - 2.5 -ASA Dose: no ASA since hospitalization 04/29/17 for GI bleed; hx of gastritis  Gtts: Dobutamine 7.5 mcg/kg/min Heparin gtt off today   Device: Medtronic BiV - DDD (lower rage 60)  -Therapies: off, end of service 08/2016  Blood products: 06/11/17 - 2 units PRBC's  Adverse Events on VAD: Significant Events on VAD Support:  - 2/27 - 12/06/16>hospitalized for BiV HF; discharged home on Mil 0.125 - 4/20 - 01/28/17>hospitalized for HF; tunneled PICC placed R subclavian; transitioned to Dob 5 (poor Co-ox on mil) - 6/1 - 03/08/17>hospitalized for R>L CHF; Dobutamine 5 - 7/9 - 04/16/17> hospitalized for altered mental status and HF. Psych eval; Dobutamine increased to 6 - 7/27 - 05/09/17>Hospitalization with melena and recurrent HF. EGD shoed ulcers in stomach. Dobutamine increased from 6 to 7.5 - 8/28 - 06/03/17>hospitalization with melena and planned IP capsule- negative    Plan/Recommendations:   1. Possible discharge today 2. Call VAD pager if any questions about VAD equipment or concerns 3. Plan for INR check this Friday per [facility]. VAD clinic visit scheduled for Monday.   Balinda Quails RN, VAD Coordinator 24/7 pager [phone]

## 2017-06-15 NOTE — Care Management Note (Signed)
Case Management Note  Patient Details  Name: Ronald Miller. MRN: [id number] Date of Birth: 08/31/1956  Subjective/Objective:     Pt admitted with emesis             Action/Plan:  PTA independent from home - active with [facility] for IV inotrope.  Pt  previously discharged to sisters.  Pt has hx of LVAD - followed closely by the LVAD/HF team.  CM spoke with LVAD coordinator and relayed concerns regarding pts emotional health - LVAD CSW to follow up.  CM will continue to follow for discharge needs   Expected Discharge Date:  06/15/17               Expected Discharge Plan:  [place]  In-House Referral:     Discharge planning Services  CM Consult  Post Acute Care Choice:    Choice offered to:  Patient  DME Arranged:  IV pump/equipment DME Agency:  [place]:  RN [facility] Agency:  [place]  Status of Service:  Completed, signed off  If discussed at [place] of Stay Meetings, dates discussed:    Additional Comments: 06/15/2017 Pt to discharge home today with sister on IV dobutamine.  [facility] aware of discharge home today. Maryclare Labrador, RN 06/15/2017, 3:50 PM

## 2017-06-15 NOTE — Discharge Summary (Signed)
Advanced Heart Failure Team  Discharge Summary   Patient ID: Ronald Miller. MRN: [id number], DOB/AGE: [birth year] 61 y.o. Admit date: 06/10/2017 D/C date:     06/15/2017   Primary Discharge Diagnoses:  1. Nausea/Weakness/pre-syncope 2. Chronic end stage biventricular systolic HF 3. LVAD 4. Acute on chronic renal failure, stage 3 5. Iron deficiency anemia 6. CAD 7. DM 8. PAF 9. Constipation  Hospital Course:   Ronald Miller. is a 61 y.o. male with history of chronic systolic HF s/p Medtronic ICD 2014, CAD s/p CABG in 2000, HTN, Hx of cocaine abuse, Tobacco abuse, Depression, PAF, and COPD. HM3 placed 09/09/2016. Admitted with weakness, nausea and pre-syncope.   History revealed that patient had self disconnected his Dobutamine while changing clothes and "forget" to reconnect. Hgb low at 7.6 on discharge. Received 1 U PRBC with only minimal increase, so given 1 additional unit with appropriate rise in Hgb. Pt with stable Hgb for remainder of admission.   Diuretics held and medications adjusted on admit with "dry" volume status". He gradually improved back on dobutamine.   Hospital course prolonged by sub-therapeutic INR at 1.5 on admit. Covered with heparin while awaiting therapeutic INR. Pt also c/o constipation. Relieved with sorbitol.   Am of 06/15/17 pt thought to be stable for discharge, though INR borderline for VAD at 1.80. Recheck in afternoon with continued rise, so thought appropriate for discharge. He will go home to his sisters house tonight. He will have INR check Friday 06/17/17, and be seen back in HF clinic 06/20/17. Pt is familiar with alarm symptoms and reasons to call VAD clinic. Arrangements made with [facility] to resume his chronic dobutamine.   LVAD Interrogation HM II: Speed: 5900 Flow: 5.6 PI: 2.9 Power: 5.0. 10-15 PI events daily.  Discharge Weight: 218 lbs Discharge Vitals: Blood pressure (!) 83/71, pulse 78, temperature (!) 97.3 F (36.3 C), temperature source Oral,  resp. rate 15, height 5\' 9"  (1.753 m), weight 218 lb 11.1 oz (99.2 kg), SpO2 99 %.  Labs: Lab Results  Component Value Date   WBC 6.0 06/15/2017   HGB 8.9 (L) 06/15/2017   HCT 29.4 (L) 06/15/2017   MCV 88.6 06/15/2017   PLT 162 06/15/2017    Recent Labs Lab 06/10/17 1940  06/15/17 0445  NA 130*  < > 132*  K 4.4  < > 4.3  CL 93*  < > 91*  CO2 28  < > 34*  BUN 49*  < > 19  CREATININE 1.82*  < > 1.35*  CALCIUM 8.3*  < > 9.0  PROT 6.7  --   --   BILITOT 0.9  --   --   ALKPHOS 76  --   --   ALT 19  --   --   AST 19  --   --   GLUCOSE 242*  < > 145*  < > = values in this interval not displayed. Lab Results  Component Value Date   CHOL 153 08/15/2013   HDL 37 (L) 08/15/2013   LDLCALC 89 08/15/2013   TRIG 133 08/15/2013   BNP (last 3 results)  Recent Labs  10/13/16 1207 11/30/16 1158 01/21/17 1230  BNP 321.5* 565.5* 649.9*    ProBNP (last 3 results) No results for input(s): PROBNP in the last 8760 hours.   Diagnostic Studies/Procedures   Dg Abd 2 Views  Result Date: 06/14/2017 CLINICAL DATA:  Abdominal pain, constipation and distention for 4 days. EXAM: ABDOMEN - 2 VIEW COMPARISON:  Single view of the abdomen 01/22/2017. FINDINGS: No free intraperitoneal air is identified. There is a large volume of stool in the ascending and transverse colon. No evidence of bowel obstruction. IMPRESSION: No acute abnormality. Large stool burden ascending and transverse colon. Electronically Signed   By: Inge Rise M.D.   On: 06/14/2017 00:08    Discharge Medications   Allergies as of 06/15/2017      Reactions   Codeine Nausea And Vomiting, Other (See Comments)   In high doses   Trazodone And Nefazodone Other (See Comments)   Dizziness with subsequent fall    Lipitor [atorvastatin] Nausea Only, Other (See Comments)   Nausea with high doses, tolerates 20mg  dose (08/22/16)   Tape Other (See Comments)   Paper tape is ok      Medication List    STOP taking these  medications   losartan 25 MG tablet Commonly known as:  COZAAR     TAKE these medications   acetaminophen 500 MG tablet Commonly known as:  TYLENOL Take 1,000 mg by mouth every 6 (six) hours as needed for moderate pain or headache.   amiodarone 200 MG tablet Commonly known as:  PACERONE Take 1 tablet (200 mg total) by mouth daily.   citalopram 20 MG tablet Commonly known as:  CELEXA Take 1 tablet (20 mg total) by mouth daily.   digoxin 0.125 MG tablet Commonly known as:  DIGOX Take 1 tablet (0.125 mg total) by mouth daily.   DOBUTamine 4-5 MG/ML-% infusion Commonly known as:  DOBUTREX Inject 571.8 mcg/min into the vein continuous. PER [facility] pharmacy   docusate sodium 100 MG capsule Commonly known as:  COLACE Take 200 mg by mouth at bedtime.   gabapentin 100 MG capsule Commonly known as:  NEURONTIN Take 2 capsules (200 mg total) by mouth 2 (two) times daily.   insulin aspart 100 UNIT/ML FlexPen Commonly known as:  NOVOLOG FLEXPEN Inject 5 Units into the skin 3 (three) times daily with meals.   Insulin Glargine 100 UNIT/ML Solostar Pen Commonly known as:  LANTUS SOLOSTAR Inject 20 Units into the skin daily at 10 pm. What changed:  how much to take   magnesium oxide 400 (241.3 Mg) MG tablet Commonly known as:  MAG-OX Take 1 tablet (400 mg total) by mouth daily.   multivitamin with minerals Tabs tablet Take 1 tablet by mouth daily.   ondansetron 4 MG disintegrating tablet Commonly known as:  ZOFRAN ODT Take 1 tablet (4 mg total) by mouth every 8 (eight) hours as needed for nausea.   pantoprazole 40 MG tablet Commonly known as:  PROTONIX Take 1 tablet (40 mg total) by mouth 2 (two) times daily before a meal.   potassium chloride SA 20 MEQ tablet Commonly known as:  K-DUR,KLOR-CON Take 1 tablet (20 mEq total) by mouth 2 (two) times daily. What changed:  how much to take   senna-docusate 8.6-50 MG tablet Commonly known as:  SENOKOT S Take 2 tablets by mouth at  bedtime as needed for mild constipation.   sildenafil 20 MG tablet Commonly known as:  REVATIO Take 2 tablets (40 mg total) by mouth 3 (three) times daily. What changed:  when to take this   spironolactone 25 MG tablet Commonly known as:  ALDACTONE Take 0.5 tablets (12.5 mg total) by mouth daily. What changed:  how much to take  when to take this   sucralfate 1 GM/10ML suspension Commonly known as:  CARAFATE Take 10 mLs (1 g total) by  mouth 4 (four) times daily -  with meals and at bedtime.   torsemide 100 MG tablet Commonly known as:  DEMADEX Take 0.5 tablets (50 mg total) by mouth 2 (two) times daily. What changed:  how much to take  how to take this  when to take this  additional instructions   traMADol 50 MG tablet Commonly known as:  ULTRAM Take 1 tablet (50 mg total) by mouth 3 (three) times daily as needed for severe pain.   warfarin 5 MG tablet Commonly known as:  COUMADIN Take 2 tablets (10 mg total) by mouth daily at 6 PM. Further per VAD clinic What changed:  how much to take  when to take this  additional instructions   zolpidem 10 MG tablet Commonly known as:  AMBIEN Take 1 tablet (10 mg total) by mouth at bedtime as needed for sleep. What changed:  when to take this            Durable Medical Equipment        Start     Ordered   06/15/17 1041  Heart failure home health orders  (Heart failure home health orders / Face to face)  Once    Comments:  Heart Failure Follow-up Care:  Verify follow-up appointments per Patient Discharge Instructions. Confirm transportation arranged. Reconcile home medications with discharge medication list. Remove discontinued medications from use. Assist patient/caregiver to manage medications using pill box. Reinforce low sodium food selection Assessments: Vital signs and oxygen saturation at each visit. Assess home environment for safety concerns, caregiver support and availability of low-sodium  foods. Consult Education officer, museum, PT/OT, Dietitian, and CNA based on assessments. Perform comprehensive cardiopulmonary assessment. Notify MD for any change in condition or weight gain of 3 pounds in one day or 5 pounds in one week with symptoms. Daily Weights and Symptom Monitoring: Ensure patient has access to scales. Teach patient/caregiver to weigh daily before breakfast and after voiding using same scale and record.    Teach patient/caregiver to track weight and symptoms and when to notify Provider. Activity: Develop individualized activity plan with patient/caregiver.  [facility] to resume Dobutamine at 7.5 mcg/kg/min x 52 weeks.  Question Answer Comment  Heart Failure Follow-up Care [facility] ([facility]F) Clinic at [phone]   Obtain the following labs Basic Metabolic Panel   Lab frequency Other see comments   Fax lab results to AHF Clinic at [phone]   Diet Low Sodium Heart Healthy   Fluid restrictions: 2000 mL Fluid   Initiate Heart Failure Clinic Diuretic Protocol to be used by [place] only ( to be ordered by Heart Failure Team Providers Only) Yes      06/15/17 1041       Discharge Care Instructions        Start     Ordered   06/15/17 0000  spironolactone (ALDACTONE) 25 MG tablet  Daily    Question:  Supervising Provider  Answer:  Jolaine Artist   06/15/17 1541   06/15/17 0000  warfarin (COUMADIN) 5 MG tablet  Daily-1800    Question:  Supervising Provider  Answer:  Glori Bickers R   06/15/17 1541   06/15/17 0000  torsemide (DEMADEX) 100 MG tablet  2 times daily    Question:  Supervising Provider  Answer:  Glori Bickers R   06/15/17 1541   06/15/17 0000  potassium chloride SA (K-DUR,KLOR-CON) 20 MEQ tablet  2 times daily    Question:  Supervising Provider  Answer:  Haroldine Laws  DANIEL R   06/15/17 1541   06/15/17 0000  Diet - low sodium heart healthy     06/15/17 1541   06/15/17 0000  Increase activity slowly     06/15/17 1541       Disposition   The patient will be discharged in stable condition to home with INR at home 06/17/17 per [facility], and close follow up as below.   Discharge Instructions    Diet - low sodium heart healthy    Complete by:  As directed    Increase activity slowly    Complete by:  As directed      Follow-up Information    [facility] Follow up on 06/20/2017.   Specialty:  Cardiology Why:  at 1100 am for post hospital follow up. Code for parking is 7002. Contact information: [address] [id number] [place] [phone]            Duration of Discharge Encounter: Greater than 35 minutes   Signed, Annamaria Helling  06/15/2017, 3:46 PM   Patient seen and examined with the above-signed Advanced Practice Provider and/or Housestaff. I personally reviewed laboratory data, imaging studies and relevant notes. I independently examined the patient and formulated the important aspects of the plan. I have edited the note to reflect any of my changes or salient points. I have personally discussed the plan with the patient and/or family.  He is stable. Volume status looks good. Co-ox ok.  INR up to 1.92 today. Co-ox ok on dobutamine. [place] for d/c today.  Glori Bickers, MD  3:59 PM

## 2017-06-15 NOTE — Telephone Encounter (Signed)
Spoke with Carolynn Sayers with [facility]. Instructed to have HHRN finger-stick for INR on Friday 9/14. Will have patient seen in VAD clinic Monday.  Balinda Quails RN, VAD Coordinator 24/7 pager [phone]

## 2017-06-15 NOTE — Progress Notes (Signed)
Advanced Heart Failure VAD Team Note  Subjective:    Continues to improve. Feels much better after having a BM. Remains mildly SOB walking halls.   Hgb 8.9 after 1 U PRBC 9/7 and 06/11/17. Denies any overt bleeding.   Coox 54.9% on dobutamine 7.5 mcg/kg/min.  Negative 2.0 L and down 1 lb.   INR 1.53 LDH 157  LVAD INTERROGATION:  HeartMate II LVAD:  Flow 5.4 liters/min, speed 5900, power 5.0, PI . 10-15 PI events.  Objective:    Vital Signs:   Temp:  [97.3 F (36.3 C)-98.7 F (37.1 C)] 98.3 F (36.8 C) (09/12 0819) Pulse Rate:  [63-89] 89 (09/12 0819) Resp:  [18] 18 (09/11 1600) BP: (95-113)/(71-88) 98/81 (09/12 0819) SpO2:  [96 %-98 %] 96 % (09/12 0819) Weight:  [218 lb 11.1 oz (99.2 kg)] 218 lb 11.1 oz (99.2 kg) (09/12 0430) Last BM Date: 06/14/17 Mean arterial Pressure 84 this am.   Intake/Output:   Intake/Output Summary (Last 24 hours) at 06/15/17 1041 Last data filed at 06/15/17 0820  Gross per 24 hour  Intake            731.2 ml  Output             3100 ml  Net          -2368.8 ml     Physical Exam    GENERAL: Well appearing this am. NAD.  HEENT: Normal. NECK: Supple, JVP 7-8 cm. Carotids OK.  CARDIAC:  Mechanical heart sounds with LVAD hum present.  LUNGS:  CTAB, normal effort.  ABDOMEN:  NT, mildly distended. no HSM. No bruits or masses. +BS  LVAD exit site: Well-healed and incorporated. Dressing dry and intact. No erythema or drainage. Stabilization device present and accurately applied. Driveline dressing changed daily per sterile technique. EXTREMITIES:  Warm and dry. No cyanosis, clubbing, rash, or edema.  NEUROLOGIC:  Alert & oriented x 3. Cranial nerves grossly intact. Moves all 4 extremities w/o difficulty. Affect pleasant     Telemetry   V pacing 70s, Personally reviewed.   EKG   N/A  Labs   Basic Metabolic Panel:  Recent Labs Lab 06/11/17 0730 06/12/17 0500 06/13/17 0520 06/14/17 0351 06/15/17 0445  NA 129* 131* 133* 132* 132*    K 4.5 4.2 3.8 3.7 4.3  CL 92* 94* 96* 92* 91*  CO2 30 31 32 34* 34*  GLUCOSE 161* 154* 120* 100* 145*  BUN 40* 31* 22* 20 19  CREATININE 1.54* 1.40* 1.23 1.22 1.35*  CALCIUM 7.5* 8.5* 8.4* 8.5* 9.0  MG  --   --   --  2.1  --     Liver Function Tests:  Recent Labs Lab 06/10/17 1940  AST 19  ALT 19  ALKPHOS 76  BILITOT 0.9  PROT 6.7  ALBUMIN 3.4*    Recent Labs Lab 06/10/17 1940  LIPASE 30    Recent Labs Lab 06/10/17 2010  AMMONIA 27    CBC:  Recent Labs Lab 06/10/17 1940 06/11/17 0730 06/12/17 0500 06/13/17 0520 06/14/17 0351 06/15/17 0445  WBC 10.5 8.1 7.5 7.1 6.2 6.0  NEUTROABS 9.3*  --   --   --   --   --   HGB 7.6* 7.7* 8.5* 8.3* 8.6* 8.9*  HCT 23.8* 24.4* 27.4* 26.6* 28.0* 29.4*  MCV 85.0 86.5 86.7 86.9 87.5 88.6  PLT 195 171 160 145* 158 162    INR:  Recent Labs Lab 06/11/17 0900 06/12/17 0500 06/13/17 0520 06/14/17 0351 06/15/17  0445  INR 1.36 1.22 1.41 1.53 1.80    Other results:     Imaging   Dg Abd 2 Views  Result Date: 06/14/2017 CLINICAL DATA:  Abdominal pain, constipation and distention for 4 days. EXAM: ABDOMEN - 2 VIEW COMPARISON:  Single view of the abdomen 01/22/2017. FINDINGS: No free intraperitoneal air is identified. There is a large volume of stool in the ascending and transverse colon. No evidence of bowel obstruction. IMPRESSION: No acute abnormality. Large stool burden ascending and transverse colon. Electronically Signed   By: Inge Rise M.D.   On: 06/14/2017 00:08     Medications:     Scheduled Medications: . amiodarone  200 mg Oral Daily  . citalopram  20 mg Oral Daily  . digoxin  0.125 mg Oral Daily  . docusate sodium  200 mg Oral QHS  . gabapentin  200 mg Oral BID  . insulin aspart  0-15 Units Subcutaneous TID WC  . insulin aspart  0-5 Units Subcutaneous QHS  . insulin aspart  6 Units Subcutaneous TID WC  . insulin glargine  10 Units Subcutaneous Q2200  . magnesium oxide  400 mg Oral Daily   . multivitamin with minerals  1 tablet Oral Daily  . pantoprazole  40 mg Oral BID AC  . sodium chloride flush  10-40 mL Intracatheter Q12H  . spironolactone  12.5 mg Oral Daily  . sucralfate  1 g Oral TID WC & HS  . torsemide  50 mg Oral BID  . Warfarin - Pharmacist Dosing Inpatient   Does not apply q1800    Infusions: . sodium chloride Stopped (06/11/17 0002)  . DOBUTamine 7.5 mcg/kg/min (06/14/17 2250)    PRN Medications: acetaminophen, ondansetron (ZOFRAN) IV, polyethylene glycol, sodium chloride flush, traMADol, zolpidem   Patient Profile   Ronald Miller. is a 61 y.o. male  with history of chronic systolic HF s/p [employer] ICD 2014, CAD s/p CABG in 2000, HTN, Hx of cocaine abuse, Tobacco abuse, Depression, PAF, and COPD. HM3 placed 09/09/2016. Admitted with weakness, nausea and pre-syncope.   Assessment/Plan:    1. Nausea/Weakness/pre-syncope - Unclear etiology. Now resolved. ? Dysrhythmia or due to being off dobutamine - ICD interrogated on admit. No high-rate episodes.   2. Chronic end stage biventricular systolic HF: LVEF 71%, ICM. S/p HM3 LVAD 03/06/6947.  - Complicated by RV failure - Chronically on dobutamine 7.5 mcg at home however he wasn't wearing it when event happened last night. - Co-ox marginal today on PTA dobutamine. May need to consider ramp echo prior to d/c.  - Diuresing on torsemide 50 mg BID.  - Has been turned down by [facility] for transplant.  - No beta blocker with RV failure. - Continue digoxin 0.125 mcg  3. LVAD - PI low with multi PI events and power disconnects. Now improving with RBCs.  - Suspect volume depletion +/- GI bleed with low hgb.  - INR 1.80 this am. Will recheck this afternoon. Possible home after.  - LDH OK.  - VAD parameters checked personally. Stable.   4. Acute on chronic renal failure, stage 3 - Resolved. Continue to follow.   5. Iron deficiency anemia - has h/o acute gastritis on EGD. Recent capsule normal - Rec'd 2u  RBCS so far this admit with minimal bump 7.6->8.5.  - Hgb stable at 8.9   - Iron stores low. Received dose of Feraheme 06/12/17.   6. CAD: Severe 3 vessel CAD s/p CABG with occluded grafts except for LIMA.  -  No s/s of ischemia.      7. DM - Continue SSI and lantus HS. No change.   8. PAF - In NSR. Continue amio and warfarin. No change.  9. Constipation - Continue miralax.  - Relieved with Sorbitol 06/14/17   Home today vs tomorrow. Recheck INR this afternoon.   I reviewed the LVAD parameters from today, and compared the results to the patient's prior recorded data.  No programming changes were made.  The LVAD is functioning within specified parameters.  The patient performs LVAD self-test daily.  LVAD interrogation was negative for any significant power changes, alarms or PI events/speed drops.  LVAD equipment check completed and is in good working order.  Back-up equipment present.   LVAD education done on emergency procedures and precautions and reviewed exit site care.   Length of Stay: 5  Annamaria Helling 06/15/2017, 10:41 AM  VAD Team --- VAD ISSUES ONLY--- Pager [phone] (7am - 7am) Advanced Heart Failure Team  Pager [phone] (M-F; 7a - 4p)  Please contact [facility] for night-coverage after hours (4p -7a ) and weekends on [email or web address]  Patient seen and examined with the above-signed Advanced Practice Provider and/or Housestaff. I personally reviewed laboratory data, imaging studies and relevant notes. I independently examined the patient and formulated the important aspects of the plan. I have edited the note to reflect any of my changes or salient points. I have personally discussed the plan with the patient and/or family.  He is stable. Volume status looks good. Co-ox ok.  INR up to 1.92 today. Co-ox ok on dobutamine. [place] for d/c today.  Glori Bickers, MD  3:59 PM

## 2017-06-16 ENCOUNTER — Telehealth (HOSPITAL_COMMUNITY): Payer: Self-pay | Admitting: *Deleted

## 2017-06-16 ENCOUNTER — Other Ambulatory Visit (HOSPITAL_COMMUNITY): Payer: Self-pay | Admitting: *Deleted

## 2017-06-16 ENCOUNTER — Ambulatory Visit (HOSPITAL_COMMUNITY)
Admission: RE | Admit: 2017-06-16 | Discharge: 2017-06-16 | Disposition: A | Discharge status: Home / Self Care | Payer: Medicare HMO | Source: Ambulatory Visit | Attending: Internal Medicine | Admitting: Internal Medicine

## 2017-06-16 DIAGNOSIS — Z9889 Other specified postprocedural states: Secondary | ICD-10-CM | POA: Insufficient documentation

## 2017-06-16 DIAGNOSIS — Z4801 Encounter for change or removal of surgical wound dressing: Secondary | ICD-10-CM | POA: Diagnosis not present

## 2017-06-16 DIAGNOSIS — Z95811 Presence of heart assist device: Secondary | ICD-10-CM

## 2017-06-16 DIAGNOSIS — Z7901 Long term (current) use of anticoagulants: Secondary | ICD-10-CM

## 2017-06-16 IMAGING — US US PARACENTESIS
1 series · 5 of 5 positions shown · non-contrast
Comparison: none

CLINICAL DATA: CHF, recurrent ascites

EXAM:
ULTRASOUND GUIDED PARACENTESIS
TECHNIQUE: The procedure, risks (including but not limited to bleeding,
infection, organ damage ), benefits, and alternatives were explained
to the patient. Questions regarding the procedure were encouraged
and answered. The patient understands and consents to the procedure.
Survey ultrasound of the abdomen was performed and an appropriate
skin entry site in the right lateral abdomen was selected. Skin site
was marked, prepped with Betadine, and draped in usual sterile
fashion, and infiltrated locally with 1% lidocaine. A
Safe-T-Centesis needle was advanced into the peritoneal space until
fluid could be aspirated. The sheath was advanced and the needle
removed. 5 L of clear yellowascites were aspirated.
COMPLICATIONS:
COMPLICATIONS
none

[Series 1: us paracentesis · 0.26mm/px · 5 of 5 slices shown]
[im 1/5]
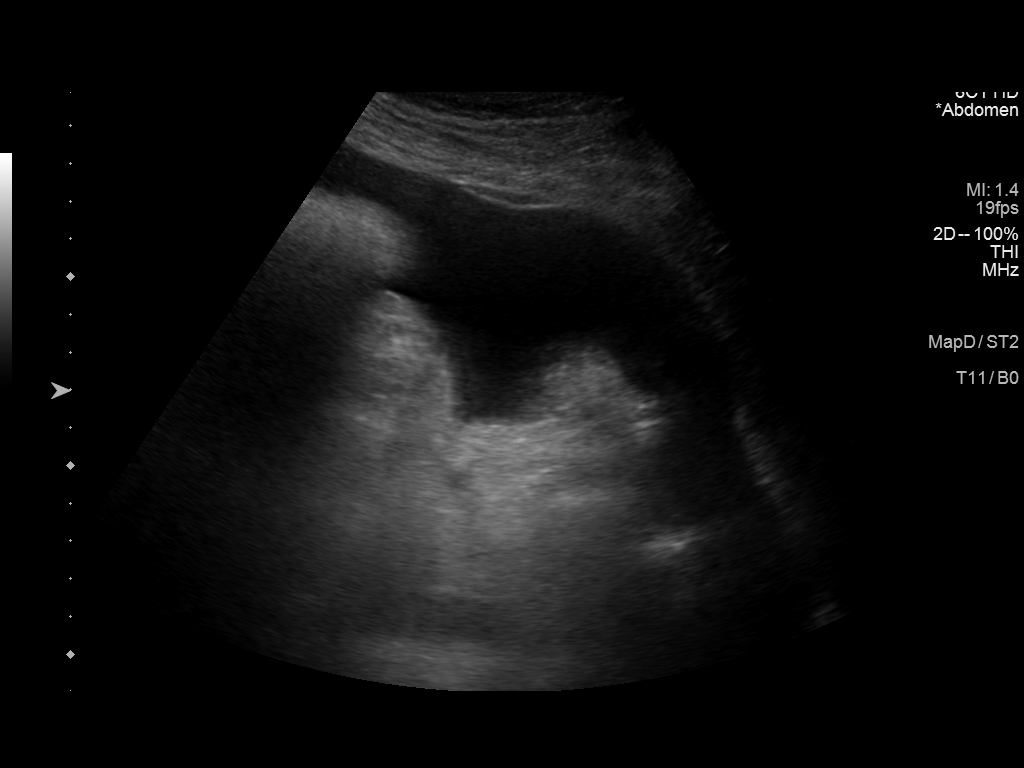
[im 2/5]
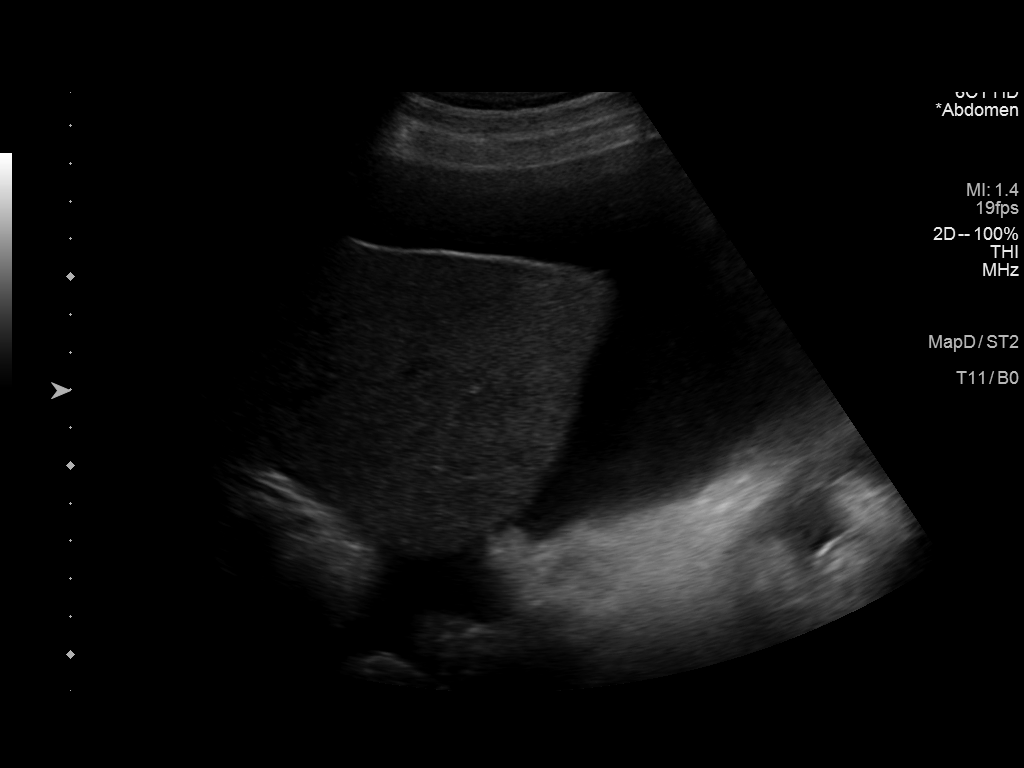
[im 3/5]
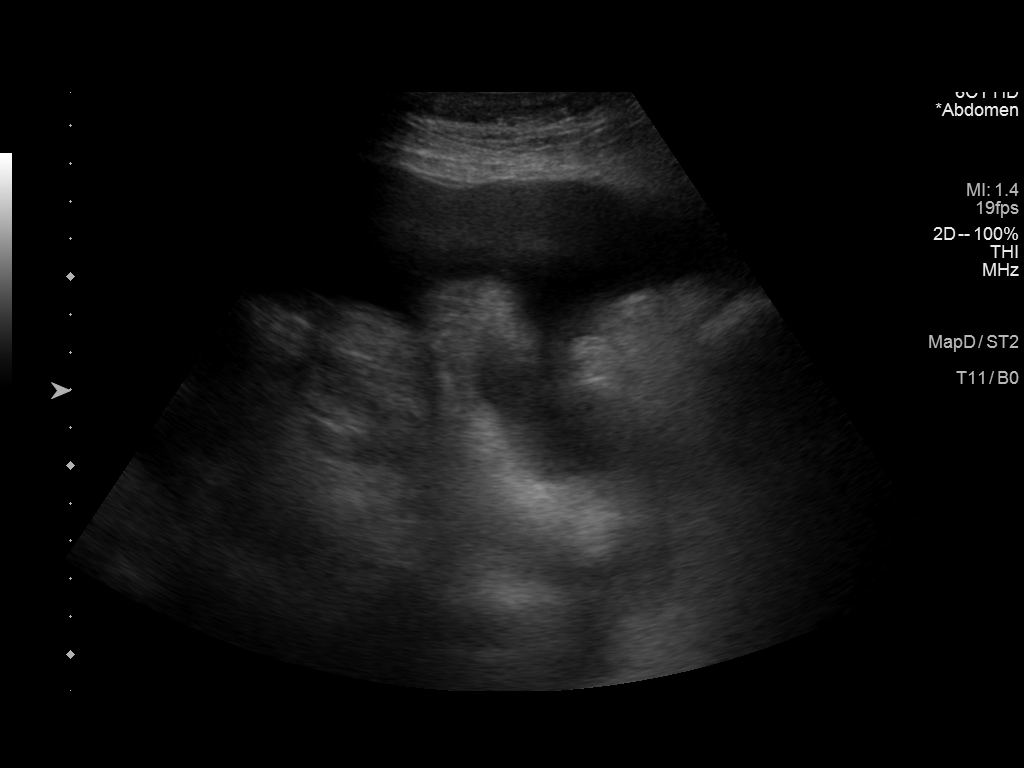
[im 4/5]
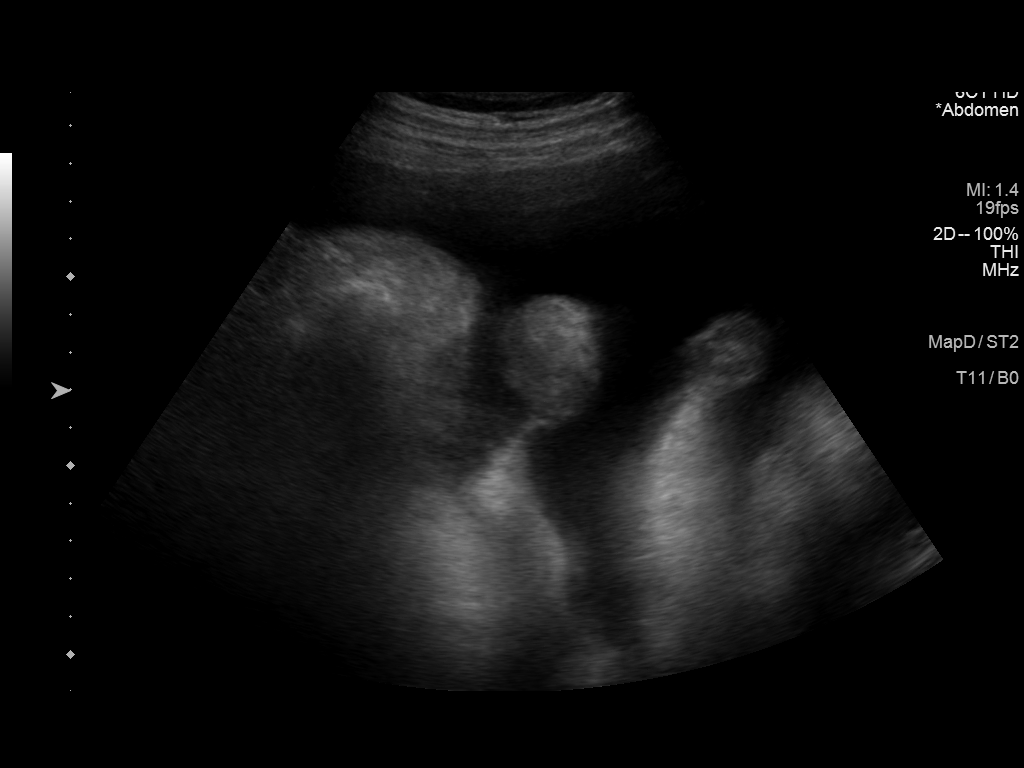
[im 5/5]
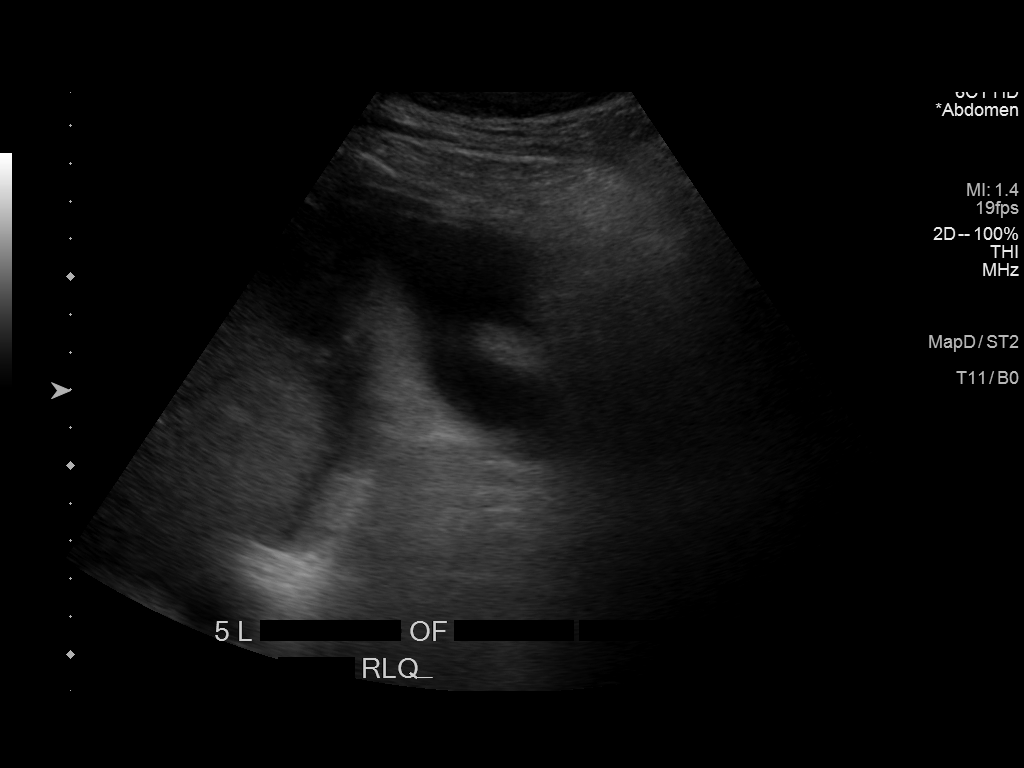

[5 of 5 positions shown; findings below may reference images not displayed]

IMPRESSION: Technically successful ultrasound guided paracentesis, removing 5 L
of ascites.

## 2017-06-16 NOTE — Progress Notes (Signed)
Pt presents to clinic with mother for VAD exit site check after dropping shoulder bag. Anchor in place, drive line was not attached at time of incident. VAD dressing removed and site care performed using sterile technique.Site with small amount of blood at exit site, no tenderness, redness, or foul odor noted. Drive line exit site cleaned with Chlora prep applicators x 2, allowed to dry, skin protectant applied and allowed to dry before Sorbaview dressing re-applied. Exit site well healed and incorporated.The velour is fully implanted at exit site. Drive line inserted in anchor attachment device. Stressed importance of keeping drive line attached to anchor to decrease trauma at site in case of pulling on drive line. Pt denies fever or chills. Driveline dressing is being changed weekly per sterile technique.  Instructed pt and his mother to call if any increased drainage, redness, tenderness, or foul odor from site. Also to call if pt develops any fevers or chills. Both verbalized understanding of same.  Dr. Haroldine Laws updated of above.   [place], [facility] nurse and asked her to check INR tomorrow and page VAD pager at [phone] with results. She verbalized understanding of same.

## 2017-06-16 NOTE — Telephone Encounter (Signed)
Pt called to report he had dropped his LVAD batteries, controller, and had "pulled on his drive line". States he is coming to [place] [facility] so we can check. [place] called to say he did not want nurse coming today to check his INR because he needed to come here.  Spoke with Charlann Boxer, RN - she will be going to his home tomorrow and will check finger stick INR if needed.

## 2017-06-17 ENCOUNTER — Ambulatory Visit (HOSPITAL_COMMUNITY): Payer: Self-pay | Admitting: Pharmacist

## 2017-06-17 ENCOUNTER — Telehealth (HOSPITAL_COMMUNITY): Payer: Self-pay | Admitting: Psychology

## 2017-06-17 DIAGNOSIS — Z95811 Presence of heart assist device: Secondary | ICD-10-CM

## 2017-06-17 LAB — POCT INR: INR: 3.5

## 2017-06-18 ENCOUNTER — Telehealth: Payer: Self-pay | Admitting: Physician Assistant

## 2017-06-18 NOTE — Telephone Encounter (Signed)
Received page from answering service regarding LVAD patient, spoke with VAD coordinator who will call patient back. Mikhayla Phillis PA-C

## 2017-06-20 ENCOUNTER — Ambulatory Visit (HOSPITAL_COMMUNITY): Payer: Self-pay | Admitting: Pharmacist

## 2017-06-20 ENCOUNTER — Telehealth (HOSPITAL_COMMUNITY): Payer: Self-pay | Admitting: *Deleted

## 2017-06-20 ENCOUNTER — Encounter (HOSPITAL_COMMUNITY): Payer: Self-pay

## 2017-06-20 ENCOUNTER — Other Ambulatory Visit (HOSPITAL_COMMUNITY): Payer: Self-pay | Admitting: *Deleted

## 2017-06-20 ENCOUNTER — Ambulatory Visit (HOSPITAL_COMMUNITY)
Admission: RE | Admit: 2017-06-20 | Discharge: 2017-06-20 | Disposition: A | Discharge status: Home / Self Care | Payer: Medicare HMO | Source: Ambulatory Visit | Attending: Cardiology | Admitting: Cardiology

## 2017-06-20 VITALS — BP 98/73 | HR 61 | Ht 69.0 in | Wt 218.4 lb

## 2017-06-20 DIAGNOSIS — Z95811 Presence of heart assist device: Secondary | ICD-10-CM | POA: Diagnosis not present

## 2017-06-20 DIAGNOSIS — E871 Hypo-osmolality and hyponatremia: Secondary | ICD-10-CM | POA: Diagnosis not present

## 2017-06-20 DIAGNOSIS — D509 Iron deficiency anemia, unspecified: Secondary | ICD-10-CM | POA: Diagnosis not present

## 2017-06-20 DIAGNOSIS — Z7982 Long term (current) use of aspirin: Secondary | ICD-10-CM | POA: Diagnosis not present

## 2017-06-20 DIAGNOSIS — E119 Type 2 diabetes mellitus without complications: Secondary | ICD-10-CM | POA: Diagnosis not present

## 2017-06-20 DIAGNOSIS — I5022 Chronic systolic (congestive) heart failure: Secondary | ICD-10-CM | POA: Insufficient documentation

## 2017-06-20 DIAGNOSIS — Z7901 Long term (current) use of anticoagulants: Secondary | ICD-10-CM | POA: Diagnosis not present

## 2017-06-20 DIAGNOSIS — Z79891 Long term (current) use of opiate analgesic: Secondary | ICD-10-CM | POA: Diagnosis not present

## 2017-06-20 DIAGNOSIS — D500 Iron deficiency anemia secondary to blood loss (chronic): Secondary | ICD-10-CM | POA: Diagnosis not present

## 2017-06-20 DIAGNOSIS — I2510 Atherosclerotic heart disease of native coronary artery without angina pectoris: Secondary | ICD-10-CM | POA: Diagnosis not present

## 2017-06-20 DIAGNOSIS — I82621 Acute embolism and thrombosis of deep veins of right upper extremity: Secondary | ICD-10-CM | POA: Insufficient documentation

## 2017-06-20 DIAGNOSIS — I110 Hypertensive heart disease with heart failure: Secondary | ICD-10-CM | POA: Insufficient documentation

## 2017-06-20 DIAGNOSIS — I5082 Biventricular heart failure: Secondary | ICD-10-CM

## 2017-06-20 DIAGNOSIS — F329 Major depressive disorder, single episode, unspecified: Secondary | ICD-10-CM | POA: Diagnosis not present

## 2017-06-20 DIAGNOSIS — Z79899 Other long term (current) drug therapy: Secondary | ICD-10-CM | POA: Diagnosis not present

## 2017-06-20 DIAGNOSIS — F418 Other specified anxiety disorders: Secondary | ICD-10-CM | POA: Diagnosis not present

## 2017-06-20 DIAGNOSIS — J449 Chronic obstructive pulmonary disease, unspecified: Secondary | ICD-10-CM | POA: Insufficient documentation

## 2017-06-20 DIAGNOSIS — Z794 Long term (current) use of insulin: Secondary | ICD-10-CM | POA: Diagnosis not present

## 2017-06-20 DIAGNOSIS — I480 Paroxysmal atrial fibrillation: Secondary | ICD-10-CM | POA: Diagnosis not present

## 2017-06-20 LAB — CBC
HCT: 29.2 % — ABNORMAL LOW (ref 39.0–52.0)
HEMOGLOBIN: 9 g/dL — AB (ref 13.0–17.0)
MCH: 27.3 pg (ref 26.0–34.0)
MCHC: 30.8 g/dL (ref 30.0–36.0)
MCV: 88.5 fL (ref 78.0–100.0)
Platelets: 145 10*3/uL — ABNORMAL LOW (ref 150–400)
RBC: 3.3 MIL/uL — AB (ref 4.22–5.81)
RDW: 19.3 % — ABNORMAL HIGH (ref 11.5–15.5)
WBC: 7.8 10*3/uL (ref 4.0–10.5)

## 2017-06-20 LAB — BASIC METABOLIC PANEL
ANION GAP: 9 (ref 5–15)
BUN: 32 mg/dL — ABNORMAL HIGH (ref 6–20)
CALCIUM: 8.8 mg/dL — AB (ref 8.9–10.3)
CHLORIDE: 94 mmol/L — AB (ref 101–111)
CO2: 28 mmol/L (ref 22–32)
Creatinine, Ser: 1.78 mg/dL — ABNORMAL HIGH (ref 0.61–1.24)
GFR calc non Af Amer: 40 mL/min — ABNORMAL LOW (ref >60)
GFR, EST AFRICAN AMERICAN: 46 mL/min — AB (ref >60)
Glucose, Bld: 205 mg/dL — ABNORMAL HIGH (ref 65–99)
Potassium: 4.7 mmol/L (ref 3.5–5.1)
Sodium: 131 mmol/L — ABNORMAL LOW (ref 135–145)

## 2017-06-20 LAB — PROTIME-INR
INR: 1.96
Prothrombin Time: 22.2 seconds — ABNORMAL HIGH (ref 11.4–15.2)

## 2017-06-20 LAB — LACTATE DEHYDROGENASE: LDH: 174 U/L (ref 98–192)

## 2017-06-20 NOTE — Patient Instructions (Signed)
You look great today!   Return to clinic in one week for labs and a dressing change.

## 2017-06-20 NOTE — Progress Notes (Addendum)
Patient presents for hospital discharge  follow up in [facility] today. Reports no problems with VAD equipment or concerns with drive line. States he has no energy and feels tired all of the time.  Vital Signs:  Doppler Pressure 100   Automatc BP: 98/73 (83) HR:61   SPO2: 95 %  Weight: 218.4 lbs lb w/o eqt Last weight: 218 lb at discharge  VAD Indication: Destination Therapy- eval complete at [facility]    VAD interrogation & Equipment Management: Speed:5900 Flow: 5.5 Power:4.6 w    PI:2.4  Alarms: no clinical alarms Events: 0-5 PI events daily  Fixed speed 5900 Low speed limit: 5600  Primary Controller:  Replace back up battery in 70months. Back up controller:   Replace back up battery in 28 months.  Annual Equipment Maintenance on UBC/PM was performed on 09/2016.   I reviewed the LVAD parameters from today and compared the results to the patient's prior recorded data. LVAD interrogation was NEGATIVE for significant power changes, NEGATIVE for clinical alarms and STABLE for PI events/speed drops. No programming changes were made and pump is functioning within specified parameters. Pt is performing daily controller and system monitor self tests along with completing weekly and monthly maintenance for LVAD equipment.  LVAD equipment check completed and is in good working order. Back-up equipment present. Charged back up battery and performed self-test on equipment.   Exit Site Care: Drive line is being maintained weekly  by VAD coordinators. Drive line exit site well healed and incorporated. The velour is fully implanted at exit site. Dressing dry and intact. No erythema or drainage. Stabilization device present and accurately applied. Pt denies fever or chills.   Significant Events on VAD Support:  11/30/16> Milrinone gtt at discharge 01/27/17>dobutamine at 5 mcg/kg/mingtt at discharge 03/08/17> RV failure 05/19/17 > dobutamine decreased from 7.5 to 6  06/2017> admit with  syncope, AMS, Dobutamine at 7.67mcg  Device:Protect Therapies: off; end of service 08/2016 Last check: complete interrogation 05/26/2017. DDD (lower rate 60); BiV pacing with 96% AS-VP   BP & Labs:  MAP100 - Doppler is reflecting modified systolic  Hgb 9.0 - No S/S of bleeding. Specifically denies melena/BRBPR or nosebleeds.  LDH stable at 174 with established baseline of 150- 225. Denies tea-colored urine. No power elevations noted on interrogation.   Plan: 1. RTC in 1 week for INR/dressing change 2. RTC in 2 weeks for VAD visit. 3. Will reach out to [facility]. Referral has been previously sent.   Balinda Quails RN [place] Coordinator   Office: [phone] 24/7 Emergency VAD Pager: [phone]

## 2017-06-20 NOTE — Telephone Encounter (Signed)
Late entry (06/18/2017 at 1345): received page from patient stating he had an uncontrolled nosebleed. Instructed patient to use Afrin spray as this has stopped his nosebleeds in the past. Patient will hold pressures and pack nose. Instructed to call VAD pager if this does not slow the bleeding.  Balinda Quails RN, VAD Coordinator 24/7 pager [phone]

## 2017-06-20 NOTE — Addendum Note (Signed)
Encounter addended by: Candy Sledge, RN on: 06/20/2017  3:44 PM<BR>    Actions taken: Sign clinical note

## 2017-06-20 NOTE — Progress Notes (Signed)
Advanced Heart Failure Clinic Note    Referring Provider: Darylene Price, FNP Primary Care: [facility] Primary Cardiologist: Dr Haroldine Laws.  Transplant Center- [facility]  HPI: Ronald Miller. is a 61 y.o. male with history of chronic systolic HF s/p Medtronic ICD 2014, CAD s/p CABG in 2000, HTN, Hx of cocaine abuse, Tobacco abuse, Depression, PAF, and COPD. HM3 placed 09/09/2016. In April 2018 he was placed on milrinone but later switched to dobutamine. He remains on dobutamine 7.43mcg.   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  06/01/2017- Capsule Endoscopy- normal except  tiny angiectasia, nonbleeding in the right colon.  Today he returns for post hospital follow up. Admitted 9/7 after he disconnected home dobutamine pump. INR was low so he was on heparin until INR was 1.8. He was discharged on 06/15/17. Overall feeling ok. Denies SOB/PND/Orthopnea. Weight at home 208-214 pounds. No BRBPR. No fever or chills.   No driveline trauma, erythema or drainage.   VAD Indication: Destination Therapy- evaluated at [facility]   VAD interrogation & Equipment Management: Speed: 5950 Flow: 5.6 Power: 4.6 PI:2.5  Alarms: no clinical alarms Events: See LVAD note.  Fixed speed 5900 Low speed limit: 5600   Past Medical History:  Diagnosis Date  . AICD (automatic cardioverter/defibrillator) present   . ASCVD (arteriosclerotic cardiovascular disease)   . Chronic systolic CHF (congestive heart failure) ([place])   . COPD (chronic obstructive pulmonary disease) ([place])   . Coronary artery disease   . Depression   . Diabetes mellitus   . History of cocaine abuse   . Hypertension   . Presence of permanent cardiac pacemaker   . Shortness of breath dyspnea   . Suicidal ideation   . Tobacco abuse     Current Outpatient Prescriptions  Medication Sig Dispense Refill  . acetaminophen (TYLENOL) 500 MG tablet Take 1,000 mg  by mouth every 6 (six) hours as needed for moderate pain or headache.    Marland Kitchen amiodarone (PACERONE) 200 MG tablet Take 1 tablet (200 mg total) by mouth daily. 30 tablet 6  . citalopram (CELEXA) 20 MG tablet Take 1 tablet (20 mg total) by mouth daily. 30 tablet 6  . digoxin (DIGOX) 0.125 MG tablet Take 1 tablet (0.125 mg total) by mouth daily. 30 tablet 6  . DOBUTamine (DOBUTREX) 4-5 MG/ML-% infusion Inject 571.8 mcg/min into the vein continuous. PER [facility]    . docusate sodium (COLACE) 100 MG capsule Take 200 mg by mouth at bedtime.     . gabapentin (NEURONTIN) 100 MG capsule Take 2 capsules (200 mg total) by mouth 2 (two) times daily. 120 capsule 3  . insulin aspart (NOVOLOG FLEXPEN) 100 UNIT/ML FlexPen Inject 5 Units into the skin 3 (three) times daily with meals. 15 mL 11  . Insulin Glargine (LANTUS SOLOSTAR) 100 UNIT/ML Solostar Pen Inject 20 Units into the skin daily at 10 pm. (Patient taking differently: Inject 10 Units into the skin daily at 10 pm. ) 15 mL 11  . magnesium oxide (MAG-OX) 400 (241.3 Mg) MG tablet Take 1 tablet (400 mg total) by mouth daily. 30 tablet 6  . Multiple Vitamin (MULTIVITAMIN WITH MINERALS) TABS tablet Take 1 tablet by mouth daily.    . ondansetron (ZOFRAN ODT) 4 MG disintegrating tablet Take 1 tablet (4 mg total) by mouth every 8 (eight) hours as needed for nausea. 6 tablet 0  . pantoprazole (PROTONIX) 40 MG tablet Take 1  tablet (40 mg total) by mouth 2 (two) times daily before a meal. 60 tablet 6  . potassium chloride SA (K-DUR,KLOR-CON) 20 MEQ tablet Take 1 tablet (20 mEq total) by mouth 2 (two) times daily. 60 tablet 3  . senna-docusate (SENOKOT S) 8.6-50 MG tablet Take 2 tablets by mouth at bedtime as needed for mild constipation. 30 tablet 6  . sildenafil (REVATIO) 20 MG tablet Take 2 tablets (40 mg total) by mouth 3 (three) times daily. (Patient taking differently: Take 40 mg by mouth 2 (two) times daily. ) 120 tablet 6  . spironolactone (ALDACTONE) 25 MG  tablet Take 0.5 tablets (12.5 mg total) by mouth daily. 15 tablet 6  . sucralfate (CARAFATE) 1 GM/10ML suspension Take 10 mLs (1 g total) by mouth 4 (four) times daily -  with meals and at bedtime. 420 mL 6  . torsemide (DEMADEX) 100 MG tablet Take 0.5 tablets (50 mg total) by mouth 2 (two) times daily. 30 tablet 3  . traMADol (ULTRAM) 50 MG tablet Take 1 tablet (50 mg total) by mouth 3 (three) times daily as needed for severe pain. 30 tablet 0  . warfarin (COUMADIN) 5 MG tablet Take 2 tablets (10 mg total) by mouth daily at 6 PM. Further per VAD clinic 60 tablet 6  . zolpidem (AMBIEN) 10 MG tablet Take 1 tablet (10 mg total) by mouth at bedtime as needed for sleep. (Patient taking differently: Take 10 mg by mouth at bedtime. ) 30 tablet 2   No current facility-administered medications for this encounter.     Codeine; Trazodone and nefazodone; Lipitor [atorvastatin]; and Tape  REVIEW OF SYSTEMS: All systems negative except as listed in HPI, PMH and Problem list.    Home weights: 208-214 pounds.   Vitals:   06/20/17 1058  BP: 98/73  Pulse: 61  SpO2: 95%    Wt Readings from Last 3 Encounters:  06/20/17 218 lb 6.4 oz (99.1 kg)  06/15/17 218 lb 11.1 oz (99.2 kg)  06/05/17 211 lb (95.7 kg)     Physical Exam: GENERAL: Well appearing, male who presents to clinic today in no acute distress. HEENT: normal  NECK: Supple, JVP  ~10 .  2+ bilaterally, no bruits.  No lymphadenopathy or thyromegaly appreciated.   CARDIAC:  Mechanical heart sounds with LVAD hum present.  LUNGS:  Clear to auscultation bilaterally.  ABDOMEN:  Soft, round, nontender, positive bowel sounds x4.     LVAD exit site: well-healed and incorporated.  Dressing dry and intact.  No erythema or drainage.  Stabilization device present and accurately applied.  Driveline dressing is being changed daily per sterile technique. EXTREMITIES:  Warm and dry, no cyanosis, clubbing, rash or edema  NEUROLOGIC:  Alert and oriented x  4.  Gait steady.  No aphasia.  No dysarthria.  Affect pleasant.        ASSESSMENT AND PLAN:  1. Chronic end-stage biventricular systolic HF: LVEF 23% due to ICM -> s/p Echo 08/24/16 LVEF 15%, RV mild dilated, moderately reduced. --> S/P HM3 LVAD 76/11/8313.  - Complicated by RV failure. Initially on milrinone but stopped due to low mixed venous saturation.  Remains on dobutamine 7.5 mcg. Per [facility]  Volume status stable. Continue torsemidde 50 mg twice a day.  - Has been turned down for transplant at [facility]. He is now motivated to requalify. - Continue sildenafil 40 tid.    - No beta blocker with RV failure. No losartan for now. May reintroduce next week. Maps stable.  -  VAD parameters reviewed personally - stable - Warfarin with goal INR 2.0 - 2.5 + ASA 81 daily.  - Check INR and adjust coumadin. Discussed with Pharm D - Had epistaxis with lovenox, will avoid this in the future.  2. CAD: No CP. 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  - No S/S ischemia.  3. DMII:  - Per PCP 4. Tobacco abuse:  - Reports complete cessation 5. Atrial fibrillation, paroxysmal -Regular. Continue amio 200 daily 6. Left pleural effusion - underwent repeat thoracentesis in Jan. 2018.  Resolved.  7.  Anxiety/Depression - Continue Celexa 20mg  daily . No change.   8. RUE DVT.  - Continue warfarin.  9. Hyponatremia:  -10. Anemia, iron-deficiency:   Has had GI work up with EGG and Capsule Endoscopy. Todays Hgb is 9. Stable. Continue cararfate.   Follow up in 2 weeks. Needs to restart cardiac rehab at [facility].    See LVAD coordinator note.   Lennon Richins 3:13 PM  Patient presents for hospital discharge  follow up in [facility] today. Reports no problems with VAD equipment or concerns with drive line. States he has no energy and feels tired all of the time.  Vital Signs:  Doppler Pressure 100              Automatc BP: 98/73 (83) HR:61  SPO2: 95 %  Weight: 218.4 lbs lb w/o  eqt Last weight: 218 lb at discharge  VAD Indication: Destination Therapy- eval complete at [facility]    VAD interrogation & Equipment Management: Speed:5900 Flow: 5.5 Power:4.6 w PI:2.4  Alarms: no clinical alarms Events: 0-5 PI events daily  Fixed speed 5900 Low speed limit: 5600  Primary Controller: Replace back up battery in 34months. Back up controller: Replace back up battery in 47months.  Annual Equipment Maintenance on UBC/PM was performed on 09/2016.  I reviewed the LVAD parameters from todayand compared the results to the patient's prior recorded data.LVAD interrogation was NEGATIVEfor significant power changes, NEGATIVEfor clinicalalarms and STABLEfor PI events/speed drops. No programming changes were madeand pump is functioning within specified parameters. Pt is performing daily controller and system monitor self tests along with completing weekly and monthly maintenance for LVAD equipment.  LVAD equipment check completed and is in good working order. Back-up equipment present. Charged back up battery and performed self-test on equipment.   Exit Site Care: Drive line is being maintained weekly by VAD coordinators. Drive line exit site well healed and incorporated. The velour is fully implanted at exit site. Dressing dry and intact. No erythema or drainage. Stabilization device present and accurately applied. Pt denies fever or chills.   Significant Events on VAD Support:  11/30/16> Milrinone gtt at discharge 01/27/17>dobutamine at 5 mcg/kg/mingtt at discharge 03/08/17> RV failure 05/19/17 >dobutamine decreased from 7.5 to 6 but later increase to 7.5 mcg.   Device:Protect Therapies: off; end of service 08/2016 Last check: complete interrogation 05/26/2017. DDD (lower rate 60); BiV pacing with 96% AS-VP  BP &Labs:  MAP100- Doppler is reflecting modified systolic  Hgb 9.0- No S/S of bleeding. Specifically denies melena/BRBPR or  nosebleeds.  LDH stable at 174with established baseline of 150- 225. Denies tea-colored urine. No power elevations noted on interrogation.   Plan: 1. RTC in 1 week for INR/dressing change 2. RTC in 2 weeks for VAD visit. 3. Will reach out to [place] cardiac rehab. Referral has been previously sent.   Balinda Quails RN VAD Coordinator    Darrick Grinder, NP  11:02 AM

## 2017-06-21 ENCOUNTER — Other Ambulatory Visit (HOSPITAL_COMMUNITY): Payer: Self-pay | Admitting: *Deleted

## 2017-06-21 ENCOUNTER — Telehealth: Payer: Self-pay | Admitting: *Deleted

## 2017-06-21 ENCOUNTER — Encounter (HOSPITAL_COMMUNITY): Payer: Self-pay | Admitting: Unknown Physician Specialty

## 2017-06-21 ENCOUNTER — Telehealth (HOSPITAL_COMMUNITY): Payer: Self-pay | Admitting: Unknown Physician Specialty

## 2017-06-21 MED ORDER — BLOOD GLUCOSE MONITOR KIT: PACK | 0 refills | Status: DC

## 2017-06-21 NOTE — Telephone Encounter (Signed)
Pt called asking for a new glucometer. Pt states that he has had his for 3 years and it is no longer working. Script sent.

## 2017-06-21 NOTE — Telephone Encounter (Signed)
Called to let Ronald Miller know that he is cleared to return to rehab. He has a lot of doctors appointments.  He is scheduled to restart on Sept 26.

## 2017-06-23 ENCOUNTER — Telehealth: Payer: Self-pay | Admitting: Licensed Clinical Social Worker

## 2017-06-23 NOTE — Telephone Encounter (Signed)
CSW spoke with patient this morning about paramedicine program in [place]. Patient feels he has too much going on at the moment with cardiac rehab and home health services and would like to put paramedicine on hold until both services near the end. Patient reports he is doing great and denies any other needs at the moment. CSW will continue to follow for support and needs as they arise. Raquel Sarna, [place], [place]

## 2017-06-27 ENCOUNTER — Ambulatory Visit (HOSPITAL_COMMUNITY)
Admission: RE | Admit: 2017-06-27 | Discharge: 2017-06-27 | Disposition: A | Discharge status: Home / Self Care | Payer: Medicare HMO | Source: Ambulatory Visit | Attending: Cardiology | Admitting: Cardiology

## 2017-06-27 ENCOUNTER — Ambulatory Visit (HOSPITAL_COMMUNITY): Payer: Self-pay | Admitting: Pharmacist

## 2017-06-27 DIAGNOSIS — Z95811 Presence of heart assist device: Secondary | ICD-10-CM | POA: Insufficient documentation

## 2017-06-27 DIAGNOSIS — Z7901 Long term (current) use of anticoagulants: Secondary | ICD-10-CM | POA: Diagnosis present

## 2017-06-27 LAB — PROTIME-INR
INR: 1.64
PROTHROMBIN TIME: 19.3 s — AB (ref 11.4–15.2)

## 2017-06-27 NOTE — Addendum Note (Signed)
Encounter addended by: Christinia Gully, RN on: 06/27/2017 11:29 AM<BR>    Actions taken: Sign clinical note

## 2017-06-27 NOTE — Progress Notes (Addendum)
Patient presents to clinic today for drive line exit wound care. Existing VAD dressing removed and site care performed using sterile technique. Drive line exit site cleaned with Chlora prep applicators x 2, allowed to dry, and Sorbaview dressing with bio patch re-applied. Exit site healed and incorporated, the velour is fully implanted at exit site. No redness, tenderness, drainage, foul odor or rash noted. Drive line anchor re-applied. Pt denies fever or chills.   Pt also states that he has moved back into his apartment and is no longer living with this sister. He states that he has a friend living with him named "Helene Kelp."  Return in 1 week for another dressing change per standard of care. INR to be repeated in 1-2 weeks pending results per anticoagulation protocol.    Tanda Rockers RN, BSN VAD Coordinator Pager [phone] (7am - 7am)

## 2017-06-28 ENCOUNTER — Telehealth: Payer: Self-pay | Admitting: *Deleted

## 2017-06-28 NOTE — Telephone Encounter (Signed)
Called to remind Ronald Miller that he is scheduled to start rehab tomorrow.  He has completed his paperwork again.  He will do his walk test and start new ITP for program.

## 2017-06-29 ENCOUNTER — Encounter (HOSPITAL_COMMUNITY): Payer: Self-pay

## 2017-06-29 ENCOUNTER — Encounter: Payer: Medicare HMO | Attending: Internal Medicine

## 2017-06-29 VITALS — Ht 69.75 in | Wt 231.0 lb

## 2017-06-29 DIAGNOSIS — Z95810 Presence of automatic (implantable) cardiac defibrillator: Secondary | ICD-10-CM | POA: Insufficient documentation

## 2017-06-29 DIAGNOSIS — I110 Hypertensive heart disease with heart failure: Secondary | ICD-10-CM | POA: Insufficient documentation

## 2017-06-29 DIAGNOSIS — Z87891 Personal history of nicotine dependence: Secondary | ICD-10-CM | POA: Diagnosis not present

## 2017-06-29 DIAGNOSIS — Z95811 Presence of heart assist device: Secondary | ICD-10-CM

## 2017-06-29 DIAGNOSIS — I2510 Atherosclerotic heart disease of native coronary artery without angina pectoris: Secondary | ICD-10-CM | POA: Diagnosis not present

## 2017-06-29 DIAGNOSIS — I5022 Chronic systolic (congestive) heart failure: Secondary | ICD-10-CM | POA: Insufficient documentation

## 2017-06-29 DIAGNOSIS — F329 Major depressive disorder, single episode, unspecified: Secondary | ICD-10-CM | POA: Diagnosis not present

## 2017-06-29 DIAGNOSIS — E119 Type 2 diabetes mellitus without complications: Secondary | ICD-10-CM | POA: Diagnosis not present

## 2017-06-29 DIAGNOSIS — J449 Chronic obstructive pulmonary disease, unspecified: Secondary | ICD-10-CM | POA: Diagnosis not present

## 2017-06-29 LAB — GLUCOSE, CAPILLARY
GLUCOSE-CAPILLARY: 153 mg/dL — AB (ref 65–99)
Glucose-Capillary: 145 mg/dL — ABNORMAL HIGH (ref 65–99)

## 2017-06-29 IMAGING — DX DG CHEST 2V
3 series · 3 of 3 positions shown · non-contrast
Comparison: 01/26/2016 and 01/12/2016.

CLINICAL DATA: Shortness of breath for 5 days. History of
congestive heart failure.

EXAM:
CHEST  2 VIEW

[chest pa (1 of 2)]
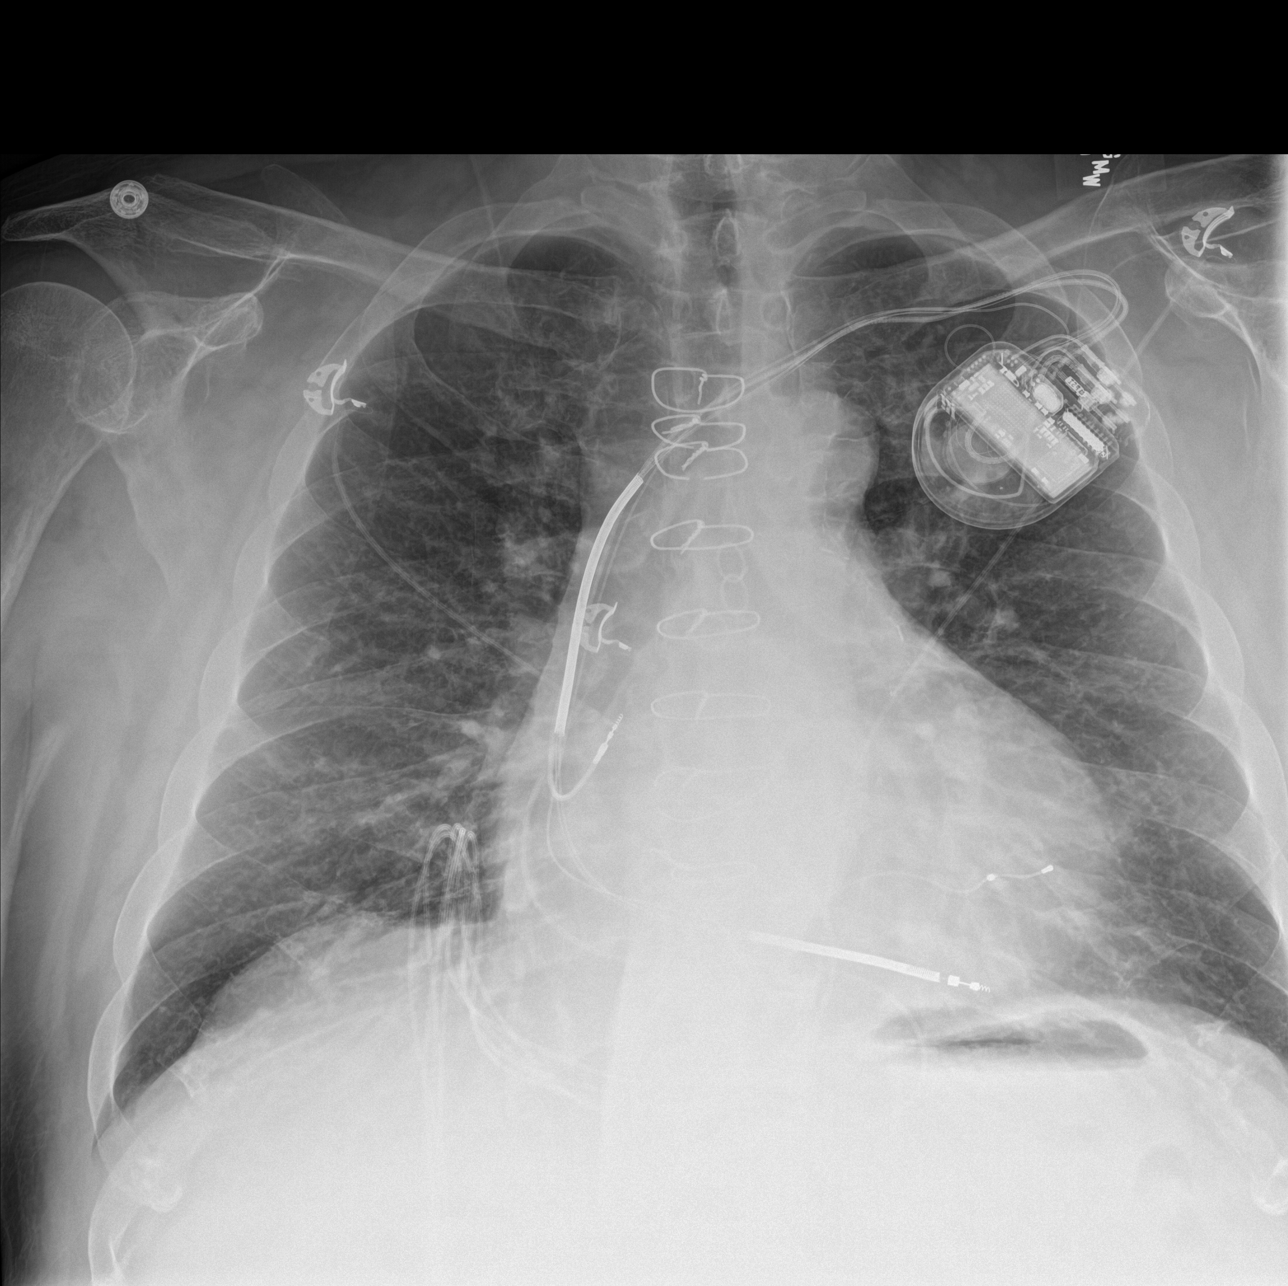

[chest lat]
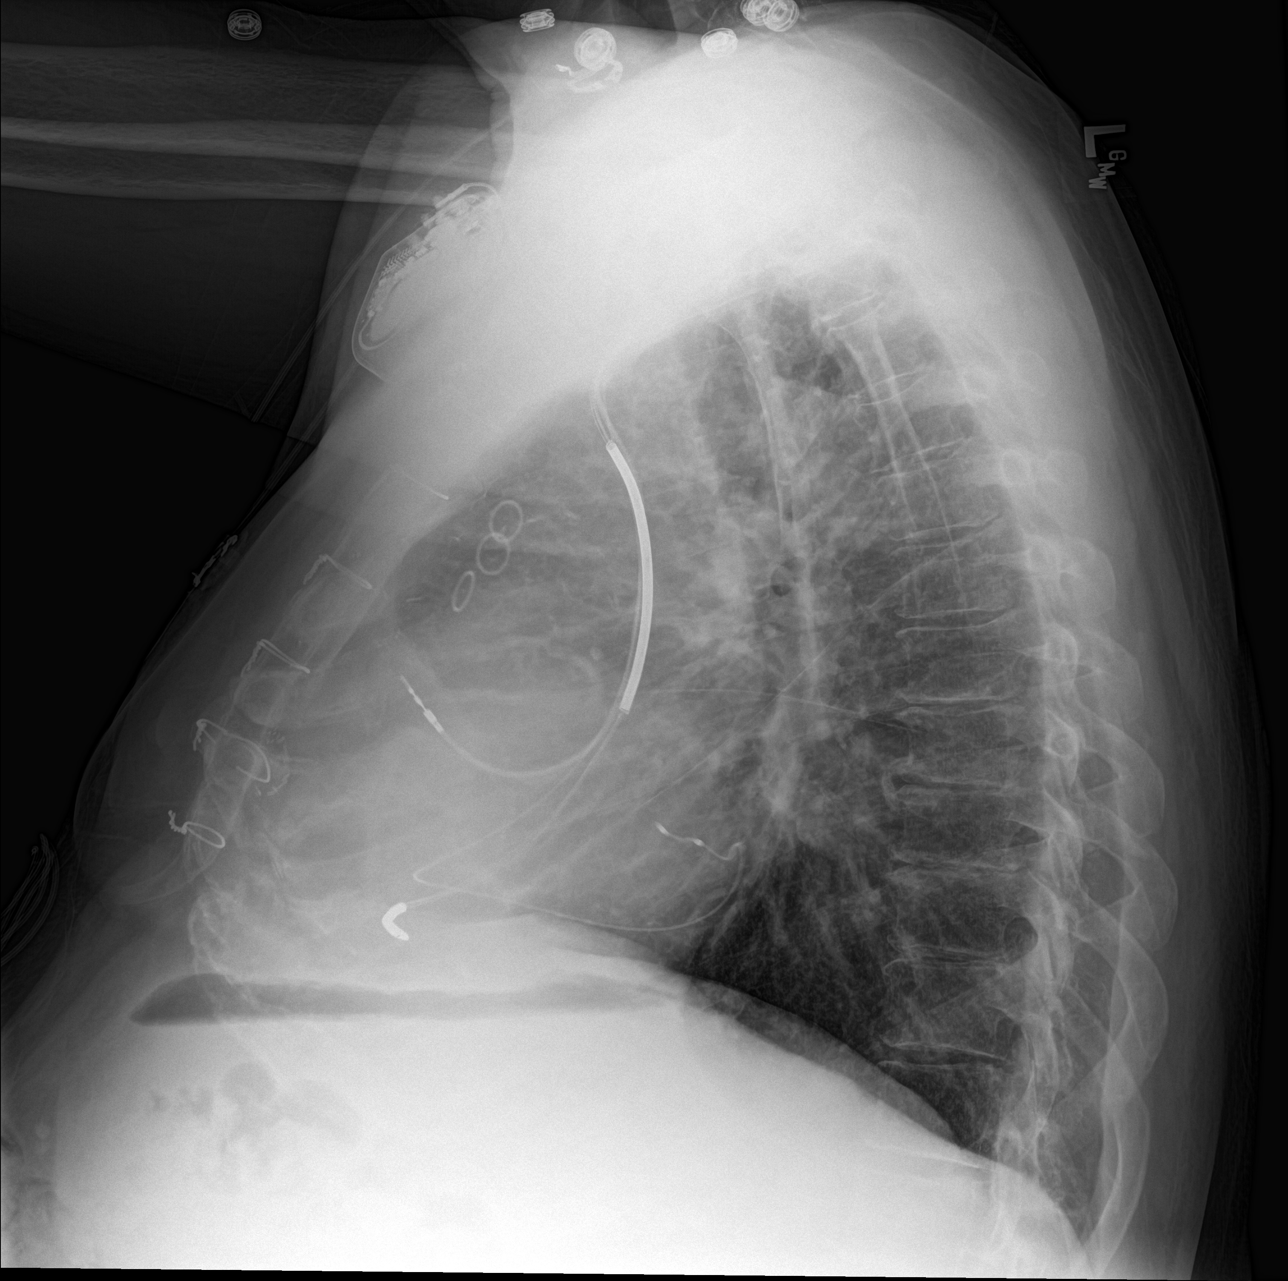

[chest pa (2 of 2)]
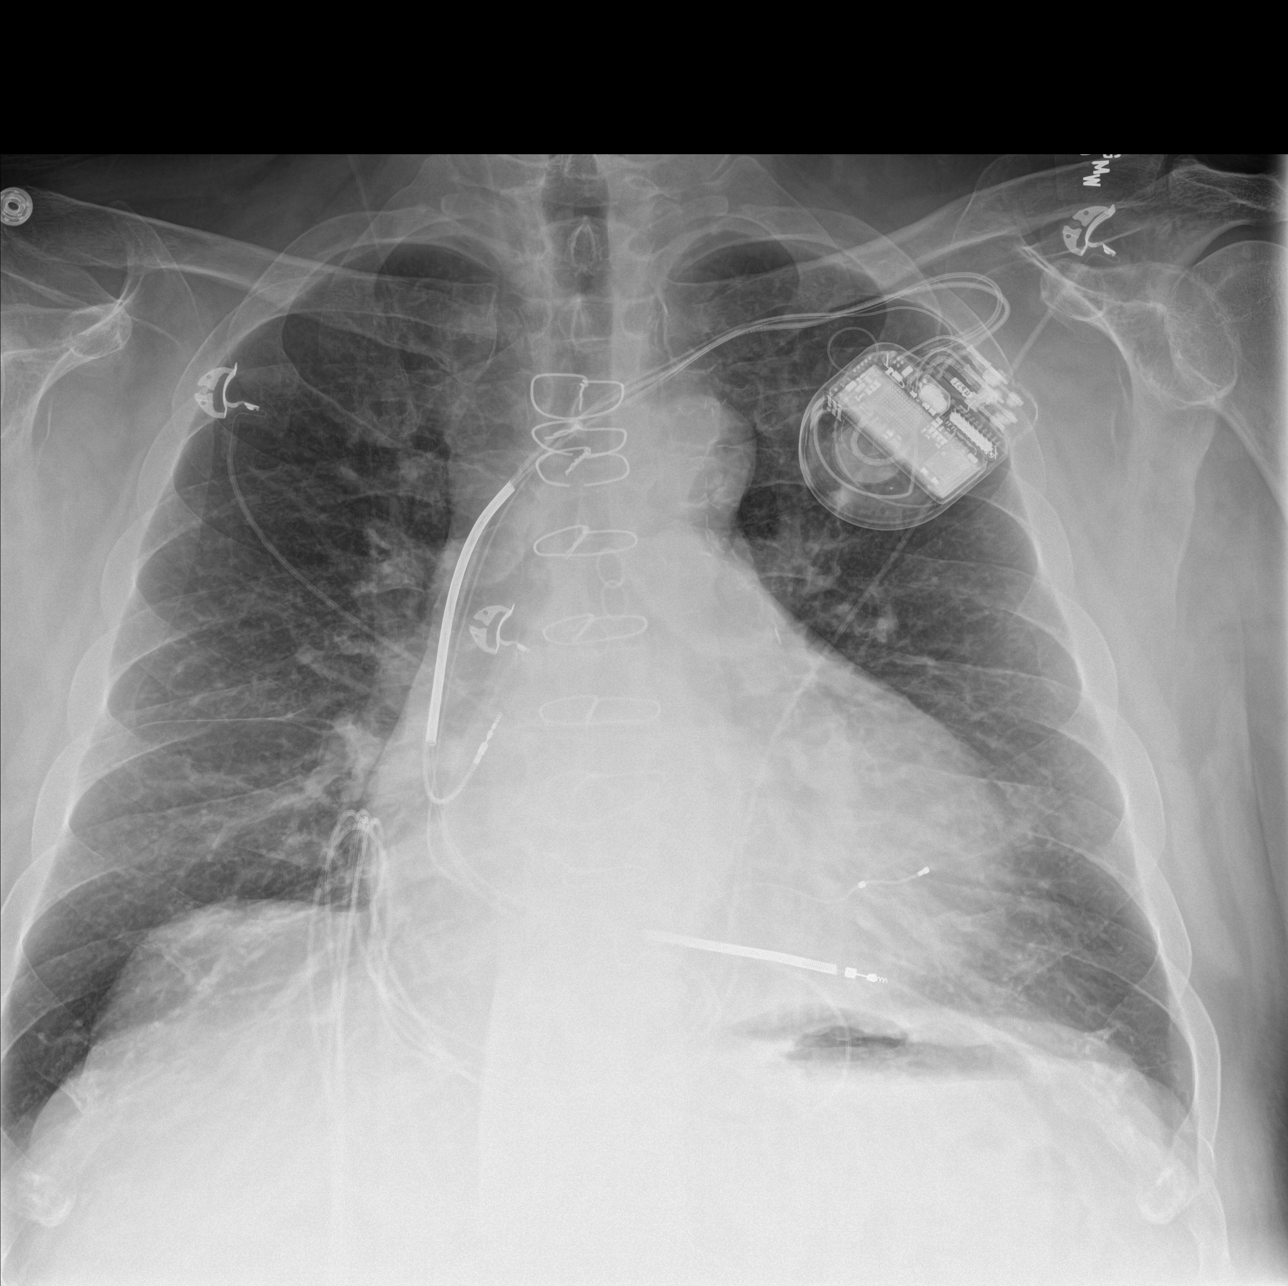

[3 of 3 positions shown; findings below may reference images not displayed]

FINDINGS: PA view was repeated. There is stable cardiomegaly status post
median sternotomy and CABG. Left subclavian AICD leads appear
unchanged. There is stable vascular congestion without overt
pulmonary edema, confluent airspace opacity or significant pleural
effusion. The bones appear unchanged.
IMPRESSION: Stable postoperative chest with cardiomegaly and chronic vascular
congestion. No acute cardiopulmonary process.

## 2017-06-29 NOTE — Progress Notes (Signed)
Daily Session Note  Patient Details  Name: Ronald Miller. MRN: [id number] Date of Birth: 03/04/1956 Referring Provider:     Cardiac Rehab from 11/23/2016 in [facility]  Referring Provider  Glori Bickers MD      Encounter Date: 06/29/2017  Check In:     Session Check In - 06/29/17 0748      Check-In   Location [facility]-Cardiac & Pulmonary Rehab   Staff Present Alberteen Sam, MA, ACSM RCEP, Exercise Physiologist;Susanne Bice, RN, BSN, CCRP; Flavia Shipper   Supervising physician immediately available to respond to emergencies See telemetry face sheet for immediately available ER MD   Medication changes reported     No   Fall or balance concerns reported    No   Warm-up and Cool-down Performed on first and last piece of equipment   Resistance Training Performed Yes   VAD Patient? Yes     VAD patient   Has back up controller? Yes   Has spare charged batteries? Yes   Has battery cables? Yes   Has compatible battery clips? Yes     Pain Assessment   Currently in Pain? No/denies   Multiple Pain Sites No         History  Smoking Status  . Former Smoker  . Packs/day: 1.50  . Years: 23.00  . Types: Cigarettes  . Start date: 10/24/1992  . Quit date: 07/01/2016  Smokeless Tobacco  . Never Used    Goals Met:  Exercise tolerated well No report of cardiac concerns or symptoms Strength training completed today  Goals Unmet:  Not Applicable  Comments: First full day of exercise!  Patient was oriented to gym and equipment including functions, settings, policies, and procedures.  Patient's individual exercise prescription and treatment plan were reviewed.  All starting workloads were established based on the results of the 6 minute walk test done at initial orientation visit.  The plan for exercise progression was also introduced and progression will be customized based on patient's performance and goals.     6 Minute Walk    Row Name  06/29/17 0930         6 Minute Walk   Phase Initial     Distance 635 feet     Walk Time 4.48 minutes     # of Rest Breaks 1  1:31     MPH 1.61     METS 1.44     RPE 13     Perceived Dyspnea  3     VO2 Peak 5.04     Symptoms Yes (comment)     Comments SOB, chest soreness from walking (Chronic)     Resting HR 59 bpm     Resting BP -  84 dopplar     Resting Oxygen Saturation  94 %     Exercise Oxygen Saturation  during 6 min walk 93 %     Max Ex. HR 61 bpm     Max Ex. BP -  90 dopplar       Interval Oxygen   Interval Oxygen? Yes     Baseline Oxygen Saturation % 94 %     1 Minute Oxygen Saturation % 95 %     1 Minute Liters of Oxygen 0 L  room air     2 Minute Oxygen Saturation % 93 %     2 Minute Liters of Oxygen 0 L     3 Minute Oxygen Saturation % 96 %  3 Minute Liters of Oxygen 0 L     4 Minute Oxygen Saturation % 96 %     4 Minute Liters of Oxygen 0 L     5 Minute Oxygen Saturation % 94 %     5 Minute Liters of Oxygen 0 L     6 Minute Oxygen Saturation % 94 %     6 Minute Liters of Oxygen 0 L     2 Minute Post Oxygen Saturation % 97 %     2 Minute Post Liters of Oxygen 0 L          Dr. Emily Filbert is Medical Director for [place] and [facility].

## 2017-06-29 NOTE — Progress Notes (Signed)
Cardiac Individual Treatment Plan  Patient Details  Name: Ronald Miller. MRN: [id number] Date of Birth: 1956-02-28 Referring Provider:     Cardiac Rehab from 06/29/2017 in [facility] Cardiac and Pulmonary Rehab  Referring Provider  Glori Bickers MD      Initial Encounter Date:    Cardiac Rehab from 06/29/2017 in [facility] Cardiac and Pulmonary Rehab  Date  06/29/17  Referring Provider  Glori Bickers MD      Visit Diagnosis: Heart failure, chronic systolic ([place])  LVAD (left ventricular assist device) present [facility])  Patient's Home Medications on Admission:  Current Outpatient Prescriptions:  .  acetaminophen (TYLENOL) 500 MG tablet, Take 1,000 mg by mouth every 6 (six) hours as needed for moderate pain or headache., Disp: , Rfl:  .  amiodarone (PACERONE) 200 MG tablet, Take 1 tablet (200 mg total) by mouth daily., Disp: 30 tablet, Rfl: 6 .  blood glucose meter kit and supplies KIT, Dispense based on patient and insurance preference. Use up to four times daily as directed. (FOR ICD-9 250.00, 250.01). Please include strips and lancets., Disp: 1 each, Rfl: 0 .  citalopram (CELEXA) 20 MG tablet, Take 1 tablet (20 mg total) by mouth daily., Disp: 30 tablet, Rfl: 6 .  digoxin (DIGOX) 0.125 MG tablet, Take 1 tablet (0.125 mg total) by mouth daily., Disp: 30 tablet, Rfl: 6 .  DOBUTamine (DOBUTREX) 4-5 MG/ML-% infusion, Inject 571.8 mcg/min into the vein continuous. PER [facility] pharmacy, Disp: , Rfl:  .  docusate sodium (COLACE) 100 MG capsule, Take 200 mg by mouth at bedtime. , Disp: , Rfl:  .  gabapentin (NEURONTIN) 100 MG capsule, Take 2 capsules (200 mg total) by mouth 2 (two) times daily., Disp: 120 capsule, Rfl: 3 .  insulin aspart (NOVOLOG FLEXPEN) 100 UNIT/ML FlexPen, Inject 5 Units into the skin 3 (three) times daily with meals., Disp: 15 mL, Rfl: 11 .  Insulin Glargine (LANTUS SOLOSTAR) 100 UNIT/ML Solostar Pen, Inject 20 Units into the skin daily at 10 pm. (Patient taking differently:  Inject 10 Units into the skin daily at 10 pm. ), Disp: 15 mL, Rfl: 11 .  magnesium oxide (MAG-OX) 400 (241.3 Mg) MG tablet, Take 1 tablet (400 mg total) by mouth daily., Disp: 30 tablet, Rfl: 6 .  Multiple Vitamin (MULTIVITAMIN WITH MINERALS) TABS tablet, Take 1 tablet by mouth daily., Disp: , Rfl:  .  ondansetron (ZOFRAN ODT) 4 MG disintegrating tablet, Take 1 tablet (4 mg total) by mouth every 8 (eight) hours as needed for nausea., Disp: 6 tablet, Rfl: 0 .  pantoprazole (PROTONIX) 40 MG tablet, Take 1 tablet (40 mg total) by mouth 2 (two) times daily before a meal., Disp: 60 tablet, Rfl: 6 .  potassium chloride SA (K-DUR,KLOR-CON) 20 MEQ tablet, Take 1 tablet (20 mEq total) by mouth 2 (two) times daily., Disp: 60 tablet, Rfl: 3 .  senna-docusate (SENOKOT S) 8.6-50 MG tablet, Take 2 tablets by mouth at bedtime as needed for mild constipation., Disp: 30 tablet, Rfl: 6 .  sildenafil (REVATIO) 20 MG tablet, Take 2 tablets (40 mg total) by mouth 3 (three) times daily. (Patient taking differently: Take 40 mg by mouth 2 (two) times daily. ), Disp: 120 tablet, Rfl: 6 .  spironolactone (ALDACTONE) 25 MG tablet, Take 0.5 tablets (12.5 mg total) by mouth daily., Disp: 15 tablet, Rfl: 6 .  sucralfate (CARAFATE) 1 GM/10ML suspension, Take 10 mLs (1 g total) by mouth 4 (four) times daily -  with meals and at bedtime., Disp:  420 mL, Rfl: 6 .  torsemide (DEMADEX) 100 MG tablet, Take 0.5 tablets (50 mg total) by mouth 2 (two) times daily., Disp: 30 tablet, Rfl: 3 .  traMADol (ULTRAM) 50 MG tablet, Take 1 tablet (50 mg total) by mouth 3 (three) times daily as needed for severe pain., Disp: 30 tablet, Rfl: 0 .  warfarin (COUMADIN) 5 MG tablet, Take 10 mg (2 tablets) daily except 5 mg (1 tablet) on Mon/Fri., Disp: , Rfl:  .  zolpidem (AMBIEN) 10 MG tablet, Take 1 tablet (10 mg total) by mouth at bedtime as needed for sleep. (Patient taking differently: Take 10 mg by mouth at bedtime. ), Disp: 30 tablet, Rfl: 2  Past  Medical History: Past Medical History:  Diagnosis Date  . AICD (automatic cardioverter/defibrillator) present   . ASCVD (arteriosclerotic cardiovascular disease)   . Chronic systolic CHF (congestive heart failure) ([place])   . COPD (chronic obstructive pulmonary disease) ([place])   . Coronary artery disease   . Depression   . Diabetes mellitus   . History of cocaine abuse   . Hypertension   . Presence of permanent cardiac pacemaker   . Shortness of breath dyspnea   . Suicidal ideation   . Tobacco abuse     Tobacco Use: History  Smoking Status  . Former Smoker  . Packs/day: 1.50  . Years: 23.00  . Types: Cigarettes  . Start date: 10/24/1992  . Quit date: 07/01/2016  Smokeless Tobacco  . Never Used    Labs: Recent Review Flowsheet Data    Labs for ITP Cardiac and Pulmonary Rehab Latest Ref Rng & Units 06/11/2017 06/12/2017 06/13/2017 06/14/2017 06/15/2017   Cholestrol 0 - 200 mg/dL - - - - -   LDLCALC 0 - 100 mg/dL - - - - -   HDL 40 - 60 mg/dL - - - - -   Trlycerides 0 - 200 mg/dL - - - - -   Hemoglobin A1c 4.8 - 5.6 % - - - - -   PHART 7.350 - 7.450 - - - - -   PCO2ART 32.0 - 48.0 mmHg - - - - -   HCO3 20.0 - 28.0 mmol/L - - - - -   TCO2 0 - 100 mmol/L - - - - -   ACIDBASEDEF 0.0 - 2.0 mmol/L - - - - -   O2SAT % 63.3 55.8 68.4 64.3 54.9       Exercise Target Goals: Date: 06/29/17  Exercise Program Goal: Individual exercise prescription set with THRR, safety & activity barriers. Participant demonstrates ability to understand and report RPE using BORG scale, to self-measure pulse accurately, and to acknowledge the importance of the exercise prescription.  Exercise Prescription Goal: Starting with aerobic activity 30 plus minutes a day, 3 days per week for initial exercise prescription. Provide home exercise prescription and guidelines that participant acknowledges understanding prior to discharge.  Activity Barriers & Risk Stratification:     Activity Barriers & Cardiac  Risk Stratification - 06/29/17 0936      Activity Barriers & Cardiac Risk Stratification   Activity Barriers History of Falls;Balance Concerns;Assistive Device;Deconditioning;Muscular Weakness;Shortness of Breath;Decreased Ventricular Function;Back Problems   Cardiac Risk Stratification High      6 Minute Walk:     6 Minute Walk    Row Name 06/29/17 0930         6 Minute Walk   Phase Initial     Distance 635 feet     Walk Time 4.48 minutes     #  of Rest Breaks 1  1:31     MPH 1.61     METS 1.44     RPE 13     Perceived Dyspnea  3     VO2 Peak 5.04     Symptoms Yes (comment)     Comments SOB, chest soreness from walking (Chronic)     Resting HR 59 bpm     Resting BP -  84 dopplar     Resting Oxygen Saturation  94 %     Exercise Oxygen Saturation  during 6 min walk 93 %     Max Ex. HR 61 bpm     Max Ex. BP -  90 dopplar       Interval Oxygen   Interval Oxygen? Yes     Baseline Oxygen Saturation % 94 %     1 Minute Oxygen Saturation % 95 %     1 Minute Liters of Oxygen 0 L  room air     2 Minute Oxygen Saturation % 93 %     2 Minute Liters of Oxygen 0 L     3 Minute Oxygen Saturation % 96 %     3 Minute Liters of Oxygen 0 L     4 Minute Oxygen Saturation % 96 %     4 Minute Liters of Oxygen 0 L     5 Minute Oxygen Saturation % 94 %     5 Minute Liters of Oxygen 0 L     6 Minute Oxygen Saturation % 94 %     6 Minute Liters of Oxygen 0 L     2 Minute Post Oxygen Saturation % 97 %     2 Minute Post Liters of Oxygen 0 L        Oxygen Initial Assessment:   Oxygen Re-Evaluation:   Oxygen Discharge (Final Oxygen Re-Evaluation):   Initial Exercise Prescription:     Initial Exercise Prescription - 06/29/17 0900      Date of Initial Exercise RX and Referring Provider   Date 06/29/17   Referring Provider Glori Bickers MD     Treadmill   MPH 1   Grade 0   Minutes 15   METs 1.77     NuStep   Level 1   SPM 80   Minutes 15   METs 1.4      Biostep-RELP   Level 1   SPM 50   Minutes 15   METs 2     Prescription Details   Frequency (times per week) 3   Duration Progress to 45 minutes of aerobic exercise without signs/symptoms of physical distress     Intensity   THRR 40-80% of Max Heartrate 99-140   Ratings of Perceived Exertion 11-13   Perceived Dyspnea 0-4     Progression   Progression Continue to progress workloads to maintain intensity without signs/symptoms of physical distress.     Resistance Training   Training Prescription Yes   Weight 2 lbs   Reps 10-15      Perform Capillary Blood Glucose checks as needed.  Exercise Prescription Changes:     Exercise Prescription Changes    Row Name 06/29/17 0900             Response to Exercise   Blood Pressure (Admit) -  84 dopplar       Blood Pressure (Exercise) -  90 dopplar       Blood Pressure (Exit) -  68 dopplar  Heart Rate (Admit) 59 bpm       Heart Rate (Exercise) 61 bpm       Oxygen Saturation (Admit) 94 %       Oxygen Saturation (Exercise) 93 %       Oxygen Saturation (Exit) 97 %       Rating of Perceived Exertion (Exercise) 13       Perceived Dyspnea (Exercise) 3       Symptoms SOB, chest soreness       Comments walk test results and first full day of exercise          Exercise Comments:     Exercise Comments    Row Name 06/29/17 0751           Exercise Comments First full day of exercise!  Patient was oriented to gym and equipment including functions, settings, policies, and procedures.  Patient's individual exercise prescription and treatment plan were reviewed.  All starting workloads were established based on the results of the 6 minute walk test done at initial orientation visit.  The plan for exercise progression was also introduced and progression will be customized based on patient's performance and goals.          Exercise Goals and Review:     Exercise Goals    Row Name 06/29/17 0751             Exercise  Goals   Increase Physical Activity Yes       Intervention Provide advice, education, support and counseling about physical activity/exercise needs.;Develop an individualized exercise prescription for aerobic and resistive training based on initial evaluation findings, risk stratification, comorbidities and participant's personal goals.       Expected Outcomes Achievement of increased cardiorespiratory fitness and enhanced flexibility, muscular endurance and strength shown through measurements of functional capacity and personal statement of participant.       Increase Strength and Stamina Yes       Intervention Provide advice, education, support and counseling about physical activity/exercise needs.;Develop an individualized exercise prescription for aerobic and resistive training based on initial evaluation findings, risk stratification, comorbidities and participant's personal goals.       Expected Outcomes Achievement of increased cardiorespiratory fitness and enhanced flexibility, muscular endurance and strength shown through measurements of functional capacity and personal statement of participant.       Able to understand and use rate of perceived exertion (RPE) scale Yes       Intervention Provide education and explanation on how to use RPE scale       Expected Outcomes Short Term: Able to use RPE daily in rehab to express subjective intensity level;Long Term:  Able to use RPE to guide intensity level when exercising independently       Able to understand and use Dyspnea scale Yes       Intervention Provide education and explanation on how to use Dyspnea scale       Expected Outcomes Short Term: Able to use Dyspnea scale daily in rehab to express subjective sense of shortness of breath during exertion;Long Term: Able to use Dyspnea scale to guide intensity level when exercising independently       Knowledge and understanding of Target Heart Rate Range (THRR) Yes       Intervention Provide  education and explanation of THRR including how the numbers were predicted and where they are located for reference       Expected Outcomes Short Term: Able to state/look up THRR;Long Term:  Able to use THRR to govern intensity when exercising independently;Short Term: Able to use daily as guideline for intensity in rehab       Able to check pulse independently -  patient has an LVAD       Understanding of Exercise Prescription Yes       Intervention Provide education, explanation, and written materials on patient's individual exercise prescription       Expected Outcomes Short Term: Able to explain program exercise prescription;Long Term: Able to explain home exercise prescription to exercise independently          Exercise Goals Re-Evaluation :   Discharge Exercise Prescription (Final Exercise Prescription Changes):     Exercise Prescription Changes - 06/29/17 0900      Response to Exercise   Blood Pressure (Admit) --  84 dopplar   Blood Pressure (Exercise) --  90 dopplar   Blood Pressure (Exit) --  68 dopplar   Heart Rate (Admit) 59 bpm   Heart Rate (Exercise) 61 bpm   Oxygen Saturation (Admit) 94 %   Oxygen Saturation (Exercise) 93 %   Oxygen Saturation (Exit) 97 %   Rating of Perceived Exertion (Exercise) 13   Perceived Dyspnea (Exercise) 3   Symptoms SOB, chest soreness   Comments walk test results and first full day of exercise      Nutrition:  Target Goals: Understanding of nutrition guidelines, daily intake of sodium <1566m, cholesterol <2060m calories 30% from fat and 7% or less from saturated fats, daily to have 5 or more servings of fruits and vegetables.  Biometrics:     Pre Biometrics - 06/29/17 0941      Pre Biometrics   Height 5' 9.75" (1.772 m)   Weight 231 lb (104.8 kg)   Waist Circumference 45 inches   Hip Circumference 43.5 inches   Waist to Hip Ratio 1.03 %   BMI (Calculated) 33.37   Single Leg Stand 12.89 seconds       Nutrition Therapy  Plan and Nutrition Goals:     Nutrition Therapy & Goals - 06/29/17 0945      Intervention Plan   Intervention Prescribe, educate and counsel regarding individualized specific dietary modifications aiming towards targeted core components such as weight, hypertension, lipid management, diabetes, heart failure and other comorbidities.   Expected Outcomes Short Term Goal: Understand basic principles of dietary content, such as calories, fat, sodium, cholesterol and nutrients.;Long Term Goal: Adherence to prescribed nutrition plan.;Short Term Goal: A plan has been developed with personal nutrition goals set during dietitian appointment.      Nutrition Discharge: Rate Your Plate Scores:     Nutrition Assessments - 06/29/17 0946      MEDFICTS Scores   Pre Score 6      Nutrition Goals Re-Evaluation:   Nutrition Goals Discharge (Final Nutrition Goals Re-Evaluation):   Psychosocial: Target Goals: Acknowledge presence or absence of significant depression and/or stress, maximize coping skills, provide positive support system. Participant is able to verbalize types and ability to use techniques and skills needed for reducing stress and depression.   Initial Review & Psychosocial Screening:     Initial Psych Review & Screening - 06/29/17 0946      Initial Review   Current issues with Current Depression;History of Depression;Current Anxiety/Panic;Current Psychotropic Meds;Current Stress Concerns   Source of Stress Concerns Chronic Illness;Poor Coping Skills;Family;Unable to participate in former interests or hobbies;Unable to perform yard/household activities;Financial;Transportation;Retirement/disability   Comments Ronald Miller has a lot going on.  Has limited support system  that have been in and out of life.      Family Dynamics   Good Support System? Yes     Barriers   Psychosocial barriers to participate in program The patient should benefit from training in stress management and  relaxation.;Psychosocial barriers identified (see note)     Screening Interventions   Interventions Yes;Encouraged to exercise;Program counselor consult;Provide feedback about the scores to participant;To provide support and resources with identified psychosocial needs   Expected Outcomes Short Term goal: Utilizing psychosocial counselor, staff and physician to assist with identification of specific Stressors or current issues interfering with healing process. Setting desired goal for each stressor or current issue identified.;Long Term Goal: Stressors or current issues are controlled or eliminated.;Short Term goal: Identification and review with participant of any Quality of Life or Depression concerns found by scoring the questionnaire.;Long Term goal: The participant improves quality of Life and PHQ9 Scores as seen by post scores and/or verbalization of changes      Quality of Life Scores:      Quality of Life - 06/29/17 0948      Quality of Life Scores   Health/Function Pre 18.68 %   Socioeconomic Pre 20.14 %   Psych/Spiritual Pre 24 %   Family Pre 22.25 %   GLOBAL Pre 20.61 %      PHQ-9: Recent Review Flowsheet Data    Depression screen [facility] 2/9 06/29/2017 11/23/2016   Decreased Interest 0 1   Down, Depressed, Hopeless 0 1   PHQ - 2 Score 0 2   Altered sleeping 2 2   Tired, decreased energy 2 1   Change in appetite 0 0   Feeling bad or failure about yourself  0 2   Trouble concentrating 0 2   Moving slowly or fidgety/restless 0 1   Suicidal thoughts 0 0   PHQ-9 Score 4 10   Difficult doing work/chores Somewhat difficult Not difficult at all     Interpretation of Total Score  Total Score Depression Severity:  1-4 = Minimal depression, 5-9 = Mild depression, 10-14 = Moderate depression, 15-19 = Moderately severe depression, 20-27 = Severe depression   Psychosocial Evaluation and Intervention:     Psychosocial Evaluation - 06/29/17 0932      Psychosocial Evaluation &  Interventions   Comments Counselor met with Mr. Frasier [facility]) today for initial psychosocial evaluation -as he has returned to this program for the 2nd time this year.  He is a 61 year old who had a LVAD procedure on 09/09/16.  Ronald Miller continues to have a strong support system with sever sisters, a mother and a daughter who live locally.  He has diabetes and poor memory issues in addition to his heart condition.  He reports sleeping poorly due to nausea in the evenings particularly.  He has Ambien to take PRN for this but reports he takes it rarely.  He has a good appetite.  Ronald Miller states he is in a good mood generally speaking; even though he has multiple stressors with his health and limited finances - as he is on disability and has a lot of medical bills.  Ronald Miller admits to a history of mild depression and some current anxiety with racing thoughts which impact his sleep at night.  He is no longer on the heart transplant list.  Ronald Miller has goals to increase his stamina and strength while in this program.  Counselor suggested he speak with his Dr. about his sleep problems (nausea) and his racing thoughts/anxiety symptoms  and he states he has an appointment next week and will address this.  Staff will be following with Ronald Miller throughout the course of this program.     Expected Outcomes Ronald Miller will benefit from consistent exercise to achieve his stated goals.   The educational and psychoeducational components of this program will be helpful in Ronald Miller understanding and coping more positively with his medical condition.     Continue Psychosocial Services  Follow up required by staff      Psychosocial Re-Evaluation:   Psychosocial Discharge (Final Psychosocial Re-Evaluation):   Vocational Rehabilitation: Provide vocational rehab assistance to qualifying candidates.   Vocational Rehab Evaluation & Intervention:   Education: Education Goals: Education classes will be provided on a variety of topics geared toward better  understanding of heart health and risk factor modification. Participant will state understanding/return demonstration of topics presented as noted by education test scores.  Learning Barriers/Preferences:     Learning Barriers/Preferences - 06/29/17 0944      Learning Barriers/Preferences   Learning Barriers None   Learning Preferences None      Education Topics: General Nutrition Guidelines/Fats and Fiber: -Group instruction provided by verbal, written material, models and posters to present the general guidelines for heart healthy nutrition. Gives an explanation and review of dietary fats and fiber.   Controlling Sodium/Reading Food Labels: -Group verbal and written material supporting the discussion of sodium use in heart healthy nutrition. Review and explanation with models, verbal and written materials for utilization of the food label.   Exercise Physiology & Risk Factors: - Group verbal and written instruction with models to review the exercise physiology of the cardiovascular system and associated critical values. Details cardiovascular disease risk factors and the goals associated with each risk factor.   Aerobic Exercise & Resistance Training: - Gives group verbal and written discussion on the health impact of inactivity. On the components of aerobic and resistive training programs and the benefits of this training and how to safely progress through these programs.   Flexibility, Balance, General Exercise Guidelines: - Provides group verbal and written instruction on the benefits of flexibility and balance training programs. Provides general exercise guidelines with specific guidelines to those with heart or lung disease. Demonstration and skill practice provided.   Stress Management: - Provides group verbal and written instruction about the health risks of elevated stress, cause of high stress, and healthy ways to reduce stress.   Depression: - Provides group verbal  and written instruction on the correlation between heart/lung disease and depressed mood, treatment options, and the stigmas associated with seeking treatment.   Anatomy & Physiology of the Heart: - Group verbal and written instruction and models provide basic cardiac anatomy and physiology, with the coronary electrical and arterial systems. Review of: AMI, Angina, Valve disease, Heart Failure, Cardiac Arrhythmia, Pacemakers, and the ICD.   Cardiac Procedures: - Group verbal and written instruction to review commonly prescribed medications for heart disease. Reviews the medication, class of the drug, and side effects. Includes the steps to properly store meds and maintain the prescription regimen. (beta blockers and nitrates)   Cardiac Medications I: - Group verbal and written instruction to review commonly prescribed medications for heart disease. Reviews the medication, class of the drug, and side effects. Includes the steps to properly store meds and maintain the prescription regimen.   Cardiac Rehab from 11/23/2016 in [facility] Cardiac and Pulmonary Rehab  Date  11/23/16  Educator  SB  Instruction Review Code (retired)  2- meets goals/outcomes  Cardiac Medications II: -Group verbal and written instruction to review commonly prescribed medications for heart disease. Reviews the medication, class of the drug, and side effects. (all other drug classes)    Go Sex-Intimacy & Heart Disease, Get SMART - Goal Setting: - Group verbal and written instruction through game format to discuss heart disease and the return to sexual intimacy. Provides group verbal and written material to discuss and apply goal setting through the application of the S.M.A.R.T. Method.   Other Matters of the Heart: - Provides group verbal, written materials and models to describe Heart Failure, Angina, Valve Disease, Peripheral Artery Disease, and Diabetes in the realm of heart disease. Includes description of the  disease process and treatment options available to the cardiac patient.   Exercise & Equipment Safety: - Individual verbal instruction and demonstration of equipment use and safety with use of the equipment.   Cardiac Rehab from 11/23/2016 in [facility] Cardiac and Pulmonary Rehab  Date  11/23/16  Educator  CE  Instruction Review Code (retired)  2- meets goals/outcomes      Infection Prevention: - Provides verbal and written material to individual with discussion of infection control including proper hand washing and proper equipment cleaning during exercise session.   Cardiac Rehab from 11/23/2016 in [facility] Cardiac and Pulmonary Rehab  Date  11/23/16  Educator  CE  Instruction Review Code (retired)  2- meets goals/outcomes      Falls Prevention: - Provides verbal and written material to individual with discussion of falls prevention and safety.   Cardiac Rehab from 11/23/2016 in [facility] Cardiac and Pulmonary Rehab  Date  11/23/16  Educator  CE  Instruction Review Code (retired)  2- meets goals/outcomes      Diabetes: - Individual verbal and written instruction to review signs/symptoms of diabetes, desired ranges of glucose level fasting, after meals and with exercise. Acknowledge that pre and post exercise glucose checks will be done for 3 sessions at entry of program.   Cardiac Rehab from 11/23/2016 in [facility] Cardiac and Pulmonary Rehab  Date  11/23/16  Educator  CE  Instruction Review Code (retired)  2- meets goals/outcomes      Other: -Provides group and verbal instruction on various topics (see comments)    Knowledge Questionnaire Score:     Knowledge Questionnaire Score - 06/29/17 0944      Knowledge Questionnaire Score   Pre Score 21/28      Core Components/Risk Factors/Patient Goals at Admission:     Personal Goals and Risk Factors at Admission - 06/29/17 0942      Core Components/Risk Factors/Patient Goals on Admission    Weight Management Yes;Obesity;Weight Loss    Intervention Weight Management: Develop a combined nutrition and exercise program designed to reach desired caloric intake, while maintaining appropriate intake of nutrient and fiber, sodium and fats, and appropriate energy expenditure required for the weight goal.;Weight Management: Provide education and appropriate resources to help participant work on and attain dietary goals.;Weight Management/Obesity: Establish reasonable short term and long term weight goals.;Obesity: Provide education and appropriate resources to help participant work on and attain dietary goals.   Admit Weight 231 lb (104.8 kg)   Goal Weight: Short Term 225 lb (102.1 kg)   Goal Weight: Long Term 221 lb (100.2 kg)   Expected Outcomes Short Term: Continue to assess and modify interventions until short term weight is achieved;Long Term: Adherence to nutrition and physical activity/exercise program aimed toward attainment of established weight goal;Weight Loss: Understanding of general recommendations for a  balanced deficit meal plan, which promotes 1-2 lb weight loss per week and includes a negative energy balance of [phone] kcal/d;Understanding recommendations for meals to include 15-35% energy as protein, 25-35% energy from fat, 35-60% energy from carbohydrates, less than 238m of dietary cholesterol, 20-35 gm of total fiber daily;Understanding of distribution of calorie intake throughout the day with the consumption of 4-5 meals/snacks   Improve shortness of breath with ADL's Yes   Intervention Provide education, individualized exercise plan and daily activity instruction to help decrease symptoms of SOB with activities of daily living.   Expected Outcomes Short Term: Achieves a reduction of symptoms when performing activities of daily living.   Diabetes Yes   Intervention Provide education about signs/symptoms and action to take for hypo/hyperglycemia.;Provide education about proper nutrition, including hydration, and  aerobic/resistive exercise prescription along with prescribed medications to achieve blood glucose in normal ranges: Fasting glucose 65-99 mg/dL   Expected Outcomes Short Term: Participant verbalizes understanding of the signs/symptoms and immediate care of hyper/hypoglycemia, proper foot care and importance of medication, aerobic/resistive exercise and nutrition plan for blood glucose control.;Long Term: Attainment of HbA1C < 7%.   Heart Failure Yes   Intervention Provide a combined exercise and nutrition program that is supplemented with education, support and counseling about heart failure. Directed toward relieving symptoms such as shortness of breath, decreased exercise tolerance, and extremity edema.   Expected Outcomes Improve functional capacity of life;Short term: Attendance in program 2-3 days a week with increased exercise capacity. Reported lower sodium intake. Reported increased fruit and vegetable intake. Reports medication compliance.;Short term: Daily weights obtained and reported for increase. Utilizing diuretic protocols set by physician.;Long term: Adoption of self-care skills and reduction of barriers for early signs and symptoms recognition and intervention leading to self-care maintenance.   Hypertension Yes   Intervention Provide education on lifestyle modifcations including regular physical activity/exercise, weight management, moderate sodium restriction and increased consumption of fresh fruit, vegetables, and low fat dairy, alcohol moderation, and smoking cessation.;Monitor prescription use compliance.   Expected Outcomes Short Term: Continued assessment and intervention until BP is < 140/942mHG in hypertensive participants. < 130/8069mG in hypertensive participants with diabetes, heart failure or chronic kidney disease.;Long Term: Maintenance of blood pressure at goal levels.   Lipids Yes   Intervention Provide education and support for participant on nutrition &  aerobic/resistive exercise along with prescribed medications to achieve LDL <29m62mDL >40mg53mExpected Outcomes Short Term: Participant states understanding of desired cholesterol values and is compliant with medications prescribed. Participant is following exercise prescription and nutrition guidelines.;Long Term: Cholesterol controlled with medications as prescribed, with individualized exercise RX and with personalized nutrition plan. Value goals: LDL < 29mg,30m > 40 mg.   Stress Yes   Intervention Offer individual and/or small group education and counseling on adjustment to heart disease, stress management and health-related lifestyle change. Teach and support self-help strategies.;Refer participants experiencing significant psychosocial distress to appropriate mental health specialists for further evaluation and treatment. When possible, include family members and significant others in education/counseling sessions.   Expected Outcomes Short Term: Participant demonstrates changes in health-related behavior, relaxation and other stress management skills, ability to obtain effective social support, and compliance with psychotropic medications if prescribed.;Long Term: Emotional wellbeing is indicated by absence of clinically significant psychosocial distress or social isolation.      Core Components/Risk Factors/Patient Goals Review:    Core Components/Risk Factors/Patient Goals at Discharge (Final Review):    ITP Comments:  ITP Comments    Row Name 06/29/17 0934           ITP Comments Medical evaluation completed/updated today for return to program.  ITP created and signed by Dr. Emily Filbert, Medical Director.  Documentation for diagnosis can be found in [facility] encounter on 06/20/17.          Comments: Initial ITP (restart)

## 2017-06-30 ENCOUNTER — Other Ambulatory Visit (HOSPITAL_COMMUNITY): Payer: Self-pay | Admitting: Internal Medicine

## 2017-06-30 ENCOUNTER — Telehealth (HOSPITAL_COMMUNITY): Payer: Self-pay | Admitting: *Deleted

## 2017-06-30 NOTE — Telephone Encounter (Signed)
Pt's [place] nurse, Sarah, called VAD pager this am to report pt has increased SOB, lower extremities edema, new blister on lower leg, and wt gain. Pt is asking if he "can take metolazone" today. Confirmed pt is taking Torsemide 50 mg bid.     Nurse reports pt wt today is 218.2 (up from 216.8 on Tuesday of this week), but pt reports his wt peaked at 221 earlier this week.  Dr. Haroldine Laws updated, instructed Judson Roch to give Lasix 80 mg IV x 1 today. Asked pt to wear TED hose/ace wrap legs for added support. Nurse verbalized understanding of same. Pt is to call VAD pager is symptoms worsen or do not improve.   Pt has VAD clinic appointment Monday, 07/04/17.

## 2017-07-01 ENCOUNTER — Other Ambulatory Visit (HOSPITAL_COMMUNITY): Payer: Self-pay | Admitting: *Deleted

## 2017-07-01 MED ORDER — MUPIROCIN 2 % EX OINT: TOPICAL_OINTMENT | CUTANEOUS | 6 refills | Status: DC

## 2017-07-01 NOTE — Progress Notes (Signed)
Ronald Miller

## 2017-07-04 ENCOUNTER — Encounter: Payer: Medicare HMO | Attending: Internal Medicine

## 2017-07-04 ENCOUNTER — Telehealth: Payer: Self-pay | Admitting: *Deleted

## 2017-07-04 ENCOUNTER — Encounter (HOSPITAL_COMMUNITY): Payer: Self-pay

## 2017-07-04 DIAGNOSIS — Z87891 Personal history of nicotine dependence: Secondary | ICD-10-CM | POA: Insufficient documentation

## 2017-07-04 DIAGNOSIS — I2510 Atherosclerotic heart disease of native coronary artery without angina pectoris: Secondary | ICD-10-CM | POA: Insufficient documentation

## 2017-07-04 DIAGNOSIS — E119 Type 2 diabetes mellitus without complications: Secondary | ICD-10-CM | POA: Insufficient documentation

## 2017-07-04 DIAGNOSIS — Z95810 Presence of automatic (implantable) cardiac defibrillator: Secondary | ICD-10-CM | POA: Insufficient documentation

## 2017-07-04 DIAGNOSIS — J449 Chronic obstructive pulmonary disease, unspecified: Secondary | ICD-10-CM | POA: Insufficient documentation

## 2017-07-04 DIAGNOSIS — I110 Hypertensive heart disease with heart failure: Secondary | ICD-10-CM | POA: Insufficient documentation

## 2017-07-04 DIAGNOSIS — F329 Major depressive disorder, single episode, unspecified: Secondary | ICD-10-CM | POA: Insufficient documentation

## 2017-07-04 DIAGNOSIS — I5022 Chronic systolic (congestive) heart failure: Secondary | ICD-10-CM | POA: Insufficient documentation

## 2017-07-04 NOTE — Telephone Encounter (Signed)
[name] called to let us know that he would be out today.  He has blistering edema and see the doctor today.  He hopes to return on Wednesday.

## 2017-07-05 ENCOUNTER — Ambulatory Visit (HOSPITAL_COMMUNITY)
Admission: RE | Admit: 2017-07-05 | Discharge: 2017-07-05 | Disposition: A | Discharge status: Home / Self Care | Payer: Medicare HMO | Source: Ambulatory Visit | Attending: Cardiology | Admitting: Cardiology

## 2017-07-05 ENCOUNTER — Ambulatory Visit (HOSPITAL_COMMUNITY): Payer: Self-pay | Admitting: Pharmacist

## 2017-07-05 ENCOUNTER — Other Ambulatory Visit (HOSPITAL_COMMUNITY): Payer: Self-pay | Admitting: *Deleted

## 2017-07-05 VITALS — BP 80/0 | HR 75 | Ht 69.0 in | Wt 227.8 lb

## 2017-07-05 DIAGNOSIS — Z950 Presence of cardiac pacemaker: Secondary | ICD-10-CM | POA: Diagnosis not present

## 2017-07-05 DIAGNOSIS — I5022 Chronic systolic (congestive) heart failure: Secondary | ICD-10-CM | POA: Diagnosis not present

## 2017-07-05 DIAGNOSIS — F329 Major depressive disorder, single episode, unspecified: Secondary | ICD-10-CM | POA: Insufficient documentation

## 2017-07-05 DIAGNOSIS — F1410 Cocaine abuse, uncomplicated: Secondary | ICD-10-CM | POA: Insufficient documentation

## 2017-07-05 DIAGNOSIS — I2510 Atherosclerotic heart disease of native coronary artery without angina pectoris: Secondary | ICD-10-CM | POA: Insufficient documentation

## 2017-07-05 DIAGNOSIS — Z794 Long term (current) use of insulin: Secondary | ICD-10-CM | POA: Insufficient documentation

## 2017-07-05 DIAGNOSIS — J449 Chronic obstructive pulmonary disease, unspecified: Secondary | ICD-10-CM | POA: Diagnosis not present

## 2017-07-05 DIAGNOSIS — Z720 Tobacco use: Secondary | ICD-10-CM | POA: Diagnosis not present

## 2017-07-05 DIAGNOSIS — S81801A Unspecified open wound, right lower leg, initial encounter: Secondary | ICD-10-CM

## 2017-07-05 DIAGNOSIS — I110 Hypertensive heart disease with heart failure: Secondary | ICD-10-CM | POA: Diagnosis not present

## 2017-07-05 DIAGNOSIS — E119 Type 2 diabetes mellitus without complications: Secondary | ICD-10-CM | POA: Diagnosis not present

## 2017-07-05 DIAGNOSIS — D509 Iron deficiency anemia, unspecified: Secondary | ICD-10-CM | POA: Diagnosis not present

## 2017-07-05 DIAGNOSIS — F419 Anxiety disorder, unspecified: Secondary | ICD-10-CM | POA: Diagnosis not present

## 2017-07-05 DIAGNOSIS — Z7982 Long term (current) use of aspirin: Secondary | ICD-10-CM | POA: Insufficient documentation

## 2017-07-05 DIAGNOSIS — E871 Hypo-osmolality and hyponatremia: Secondary | ICD-10-CM | POA: Diagnosis not present

## 2017-07-05 DIAGNOSIS — Z95810 Presence of automatic (implantable) cardiac defibrillator: Secondary | ICD-10-CM | POA: Insufficient documentation

## 2017-07-05 DIAGNOSIS — I480 Paroxysmal atrial fibrillation: Secondary | ICD-10-CM | POA: Diagnosis not present

## 2017-07-05 DIAGNOSIS — Z95811 Presence of heart assist device: Secondary | ICD-10-CM

## 2017-07-05 DIAGNOSIS — Z951 Presence of aortocoronary bypass graft: Secondary | ICD-10-CM | POA: Diagnosis not present

## 2017-07-05 DIAGNOSIS — Z7901 Long term (current) use of anticoagulants: Secondary | ICD-10-CM | POA: Insufficient documentation

## 2017-07-05 LAB — CBC
HEMATOCRIT: 29.1 % — AB (ref 39.0–52.0)
Hemoglobin: 9.1 g/dL — ABNORMAL LOW (ref 13.0–17.0)
MCH: 26.2 pg (ref 26.0–34.0)
MCHC: 31.3 g/dL (ref 30.0–36.0)
MCV: 83.9 fL (ref 78.0–100.0)
Platelets: 216 10*3/uL (ref 150–400)
RBC: 3.47 MIL/uL — ABNORMAL LOW (ref 4.22–5.81)
RDW: 17.3 % — ABNORMAL HIGH (ref 11.5–15.5)
WBC: 6.6 10*3/uL (ref 4.0–10.5)

## 2017-07-05 LAB — BASIC METABOLIC PANEL
Anion gap: 13 (ref 5–15)
BUN: 43 mg/dL — AB (ref 6–20)
CHLORIDE: 81 mmol/L — AB (ref 101–111)
CO2: 35 mmol/L — AB (ref 22–32)
CREATININE: 1.86 mg/dL — AB (ref 0.61–1.24)
Calcium: 9 mg/dL (ref 8.9–10.3)
GFR calc Af Amer: 44 mL/min — ABNORMAL LOW (ref >60)
GFR calc non Af Amer: 38 mL/min — ABNORMAL LOW (ref >60)
Glucose, Bld: 129 mg/dL — ABNORMAL HIGH (ref 65–99)
Potassium: 3.5 mmol/L (ref 3.5–5.1)
SODIUM: 129 mmol/L — AB (ref 135–145)

## 2017-07-05 LAB — PROTIME-INR
INR: 1.96
Prothrombin Time: 22.2 seconds — ABNORMAL HIGH (ref 11.4–15.2)

## 2017-07-05 LAB — LACTATE DEHYDROGENASE: LDH: 213 U/L — ABNORMAL HIGH (ref 98–192)

## 2017-07-05 MED ORDER — FUROSEMIDE 10 MG/ML IJ SOLN
80.0000 mg | Freq: Once | INTRAMUSCULAR | Status: AC
  Administered 2017-07-05: 80 mg via INTRAVENOUS
  Filled 2017-07-05: qty 8

## 2017-07-05 MED ORDER — POTASSIUM CHLORIDE CRYS ER 20 MEQ PO TBCR
40.0000 meq | EXTENDED_RELEASE_TABLET | Freq: Once | ORAL | Status: AC
  Administered 2017-07-05: 40 meq via ORAL
  Filled 2017-07-05: qty 2

## 2017-07-05 NOTE — Progress Notes (Signed)
Advanced Heart Failure Clinic Note    Referring Provider: Darylene Price, FNP Primary Care: [facility] Primary Cardiologist: Dr Haroldine Laws.  Transplant Center- [facility]  HPI: Ronald Miller. is a 61 y.o. male with history of chronic systolic HF s/p Medtronic ICD 2014, CAD s/p CABG in 2000, HTN, Hx of cocaine abuse, Tobacco abuse, Depression, PAF, and COPD. HM3 placed 09/09/2016. In April 2018 he was placed on milrinone but later switched to dobutamine. He remains on dobutamine 7.84mg.   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  06/01/2017- Capsule Endoscopy- normal except  tiny angiectasia, nonbleeding in the right colon.  Today he returns for LVAD follow up. On Saturday he received 80 mg IV lasix due to weight gain. Overall feeling ok. Weight at home trending up. Has been drinking extra fluid and eating salty foods such as chinese food. Mild dyspnea with exertion. Denies PND/orthopnea. No BRBPR. Appetite ok. No fever or chills. No bleeding problems.  Attending cardiac rehab. [facility]. Says he accidentally pulled on his R tunneled cath.   No driveline trauma, erythema or drainage.   VAD Indication: Destination Therapy- [facility]  VAD interrogation & Equipment Management: Speed: 5900 Flow: 5.5  Power: 4.7  PI: 3.0  Alarms: no clinical alarms Events: See LVAD note.  Fixed speed 5900 Low speed limit: 5600   Past Medical History:  Diagnosis Date  . AICD (automatic cardioverter/defibrillator) present   . ASCVD (arteriosclerotic cardiovascular disease)   . Chronic systolic CHF (congestive heart failure) ([place]   . COPD [place] obstructive pulmonary disease) [place]   . Coronary artery disease   . Depression   . Diabetes mellitus   . History of cocaine abuse   . Hypertension   . Presence of permanent cardiac pacemaker   . Shortness of breath dyspnea   .  Suicidal ideation   . Tobacco abuse     Current Outpatient Prescriptions  Medication Sig Dispense Refill  . acetaminophen (TYLENOL) 500 MG tablet Take 1,000 mg by mouth every 6 (six) hours as needed for moderate pain or headache.    .Marland Kitchenamiodarone (PACERONE) 200 MG tablet Take 1 tablet (200 mg total) by mouth daily. 30 tablet 6  . blood glucose meter kit and supplies KIT Dispense based on patient and insurance preference. Use up to four times daily as directed. (FOR ICD-9 250.00, 250.01). Please include strips and lancets. 1 each 0  . citalopram (CELEXA) 20 MG tablet Take 1 tablet (20 mg total) by mouth daily. 30 tablet 6  . digoxin (DIGOX) 0.125 MG tablet Take 1 tablet (0.125 mg total) by mouth daily. 30 tablet 6  . DOBUTamine (DOBUTREX) 4-5 MG/ML-% infusion Inject 571.8 mcg/min into the vein continuous. [facility]    . docusate sodium (COLACE) 100 MG capsule Take 200 mg by mouth at bedtime.     . gabapentin (NEURONTIN) 100 MG capsule Take 2 capsules (200 mg total) by mouth 2 (two) times daily. 120 capsule 3  . insulin aspart (NOVOLOG FLEXPEN) 100 UNIT/ML FlexPen Inject 5 Units into the skin 3 (three) times daily with meals. 15 mL 11  . Insulin Glargine (LANTUS SOLOSTAR) 100 UNIT/ML Solostar Pen Inject 20 Units into the skin daily at 10 pm. (Patient taking differently: Inject 10 Units into the skin daily at 10 pm. ) 15 mL 11  . magnesium oxide (MAG-OX) 400 (241.3 Mg) MG tablet  Take 1 tablet (400 mg total) by mouth daily. 30 tablet 6  . Multiple Vitamin (MULTIVITAMIN WITH MINERALS) TABS tablet Take 1 tablet by mouth daily.    . mupirocin ointment (BACTROBAN) 2 % Apply 1 application topically to leg blisters 2 times daily or as needed 22 g 6  . ondansetron (ZOFRAN ODT) 4 MG disintegrating tablet Take 1 tablet (4 mg total) by mouth every 8 (eight) hours as needed for nausea. 6 tablet 0  . pantoprazole (PROTONIX) 40 MG tablet Take 1 tablet (40 mg total) by mouth 2 (two) times daily before a  meal. 60 tablet 6  . potassium chloride SA (K-DUR,KLOR-CON) 20 MEQ tablet Take 1 tablet (20 mEq total) by mouth 2 (two) times daily. 60 tablet 3  . senna-docusate (SENOKOT S) 8.6-50 MG tablet Take 2 tablets by mouth at bedtime as needed for mild constipation. 30 tablet 6  . sildenafil (REVATIO) 20 MG tablet Take 2 tablets (40 mg total) by mouth 3 (three) times daily. (Patient taking differently: Take 40 mg by mouth 2 (two) times daily. ) 120 tablet 6  . spironolactone (ALDACTONE) 25 MG tablet Take 0.5 tablets (12.5 mg total) by mouth daily. 15 tablet 6  . sucralfate (CARAFATE) 1 GM/10ML suspension Take 10 mLs (1 g total) by mouth 4 (four) times daily -  with meals and at bedtime. 420 mL 6  . torsemide (DEMADEX) 100 MG tablet Take 0.5 tablets (50 mg total) by mouth 2 (two) times daily. 30 tablet 3  . traMADol (ULTRAM) 50 MG tablet Take 1 tablet (50 mg total) by mouth 3 (three) times daily as needed for severe pain. 30 tablet 0  . warfarin (COUMADIN) 5 MG tablet Take 10 mg (2 tablets) daily except 5 mg (1 tablet) on Mon/Fri.    . zolpidem (AMBIEN) 10 MG tablet Take 1 tablet (10 mg total) by mouth at bedtime as needed for sleep. (Patient taking differently: Take 10 mg by mouth at bedtime. ) 30 tablet 2   No current facility-administered medications for this encounter.     Codeine; Trazodone and nefazodone; Lipitor [atorvastatin]; and Tape  REVIEW OF SYSTEMS: All systems negative except as listed in HPI, PMH and Problem list.    Home weights: 208-214 pounds.   Vitals:   07/05/17 1005 07/05/17 1006  BP: (!) 88/53 (!) 80/0  Pulse: 75   SpO2: 93%     Wt Readings from Last 3 Encounters:  07/05/17 227 lb 12.8 oz (103.3 kg)  06/29/17 231 lb (104.8 kg)  06/20/17 218 lb 6.4 oz (99.1 kg)     Physical Exam: GENERAL: Well appearing, male who presents to clinic today in no acute distress. HEENT: normal  NECK: Supple, JVP to jaw   2+ bilaterally, no bruits.  No lymphadenopathy or thyromegaly  appreciated.   CARDIAC:  Mechanical heart sounds with LVAD hum present. R upper chest tunneled PICC erythema around stitch.  LUNGS:  Clear to auscultation bilaterally.  ABDOMEN:  Soft, round, nontender, distended, positive bowel sounds x4.     LVAD exit site: well-healed and incorporated.  Dressing dry and intact.  No erythema or drainage.  Stabilization device present and accurately applied.  Driveline dressing is being changed daily per sterile technique. EXTREMITIES:  Warm and dry, no cyanosis, clubbing, rash. RLE blister 1+  edema  NEUROLOGIC:  Alert and oriented x 4.  Gait steady.  No aphasia.  No dysarthria.  Affect pleasant.      ASSESSMENT AND PLAN:  1. Chronic end-stage  biventricular systolic HF: LVEF 90% due to ICM -> s/p Echo 08/24/16 LVEF 15%, RV mild dilated, moderately reduced. 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  Has been turned down for [facility] at [facility].  - Complicated by RV failure. Initially on milrinone but stopped due to low mixed venous saturation.   Remains on dobutamine 7.5 mcg. Per [facility]  Volume status elevated in the setting of high salt intake and excessive fluid intake.  -Give 80 mg IV lasix + 40 meq in the clinic now. Continue torsemide 50 mg twice a day. Renal function stable.  - Continue sildenafil 40 tid.    - No beta blocker with RV failure.  -Maps stable. No losartan for now.   - VAD parameters stable.  - Warfarin with goal INR 2.0 - 2.5 + ASA 81 daily.  - Check INR now. Discussed with pharm D. INR and adjust coumadin. Discussed with Pharm D - Had epistaxis with lovenox, will avoid this in the future.  2. CAD: No CP. 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  -No S/S ischemia 3. DMII:  - Per PCP 4. Tobacco abuse:  - Reports complete cessation 5. Atrial fibrillation, paroxysmal Sounds regular. . Continue amio 200 daily. Check TSH next week.  6. Left pleural effusion - underwent repeat thoracentesis in Jan. 2018.  Resolved.  7.   Anxiety/Depression - Continue Celexa 13m daily . No change.   8. RUE DVT.  - Continue warfarin.  9. Hyponatremia: Sodium 129  10. Anemia, iron-deficiency:   Has had GI work up with EGG and Capsule Endoscopy.Today hgb is 9.1.  Continue cararfate.  11. RLE- Partial thickness wound on the anterior aspect-. WBC 6.6 today. Does not appear infected. Related to edema. Ask [facility] to apply unna boots.    Follow up on Thursday to reassess volume status. Anticipate increasing torsemide to 100 mg twice daily.   See attached LVAD coordinator note.  Greater than 50% of the (total minutes 50) visit spent in counseling/coordination of care regarding hf diet, low salt foods, wound care for RLE, and ongoing need compliance.     Amy Clegg  NP-C  10:15 AM Patient presents for 2 week  [facility] Reports no problems with VAD equipment or concerns with drive line. Pts weight is up 9 lbs and he has a large amount of abdominal distention.   Vital Signs:  Doppler Pressure 80                Automatc BP: 88/53 (56) HR: 75  SPO2: 93 %  Weight: 227.8 lbs lb w/o eqt Last weight: 218.4 lb at discharge  VAD Indication: Destination Therapy- eval complete at [facility]   VAD interrogation & Equipment Management: Speed:5900 Flow: 5.5 Power:4.6 w PI:2.4  Alarms: several LV alarms, 2 no external power alarms-pt states his MPU was unplugged from the wall. Events: 0-5 PI events daily  Fixed speed 5900 Low speed limit: 5600  Primary Controller: Replace back up battery in 27 months. Back up controller: Replace back up battery in 227month  Annual Equipment Maintenance on UBC/PM was performed on 09/2016.  I reviewed the LVAD parameters from todayand compared the results to the patient's prior recorded data.LVAD interrogation was NEGATIVEfor significant power changes, NEGATIVEfor clinicalalarms and STABLEfor PI events/speed drops. No programming changes were madeand pump is  functioning within specified parameters. Pt is performing daily controller and system monitor self tests along with completing weekly and monthly maintenance for  LVAD equipment.  LVAD equipment check completed and is in good working order. Back-up equipment present. Charged back up battery and performed self-test on equipment.   Exit Site Care: Drive line is being maintained weekly by VAD coordinators. Drive line exit site well healed and incorporated. The velour is fully implanted at exit site. Dressing dry and intact. No erythema or drainage. Stabilization device present and accurately applied. Pt denies fever or chills.   Significant Events on VAD Support:  11/30/16> Milrinone gtt at discharge 01/27/17>dobutamine at 5 mcg/kg/mingtt at discharge 03/08/17> RV failure 05/19/17 >dobutamine decreased from 7.5 to 6  06/2017> admit with syncope, AMS, Dobutamine at 7.32mg  Device:Protect Therapies: off; end of service 08/2016 Last check: complete interrogation 05/26/2017. DDD (lower rate 60); BiV pacing with 96% AS-VP   BP &Labs:  MAP 88- Doppler is reflecting modified systolic  Hgb 9.1- No S/S of bleeding. Specifically denies melena/BRBPR or nosebleeds.  LDH stable at 213with established baseline of 150- 225. Denies tea-colored urine. No power elevations noted on interrogation.   Plan: 1. RTC in 1 week for VAD visit.  2. Increase Colace to 200 mg BID 3. Pt given 80 IV Lasix in clinic along with 40 mEq of Potassium.  4. [place] twice weekly.   STanda RockersRN VMiloCoordinator   Office: [phone] Emergency VAD Pager: [phone]

## 2017-07-05 NOTE — Progress Notes (Signed)
Patient presents for 2 week  follow up in [facility] today. Reports no problems with VAD equipment or concerns with drive line. Pts weight is up 9 lbs and he has a large amount of abdominal distention.   Vital Signs:  Doppler Pressure 80   Automatc BP: 88/53 (56) HR: 75   SPO2: 93 %  Weight: 227.8 lbs lb w/o eqt Last weight: 218.4 lb at discharge  VAD Indication: Destination Therapy- eval complete at [facility]    VAD interrogation & Equipment Management: Speed:5900 Flow: 5.5 Power:4.6 w    PI:2.4  Alarms: several LV alarms, 2 no external power alarms-pt states his MPU was unplugged from the wall. Events: 0-5 PI events daily  Fixed speed 5900 Low speed limit: 5600  Primary Controller:  Replace back up battery in 27 months. Back up controller:   Replace back up battery in 27 months.  Annual Equipment Maintenance on UBC/PM was performed on 09/2016.   I reviewed the LVAD parameters from today and compared the results to the patient's prior recorded data. LVAD interrogation was NEGATIVE for significant power changes, NEGATIVE for clinical alarms and STABLE for PI events/speed drops. No programming changes were made and pump is functioning within specified parameters. Pt is performing daily controller and system monitor self tests along with completing weekly and monthly maintenance for LVAD equipment.  LVAD equipment check completed and is in good working order. Back-up equipment present. Charged back up battery and performed self-test on equipment.   Exit Site Care: Drive line is being maintained weekly  by VAD coordinators. Drive line exit site well healed and incorporated. The velour is fully implanted at exit site. Dressing dry and intact. No erythema or drainage. Stabilization device present and accurately applied. Pt denies fever or chills.   Significant Events on VAD Support:  11/30/16> Milrinone gtt at discharge 01/27/17>dobutamine at 5 mcg/kg/mingtt at  discharge 03/08/17> RV failure 05/19/17 > dobutamine decreased from 7.5 to 6  06/2017> admit with syncope, AMS, Dobutamine at 7.74mcg  Device:Protect Therapies: off; end of service 08/2016 Last check: complete interrogation 05/26/2017. DDD (lower rate 60); BiV pacing with 96% AS-VP   BP & Labs:  MAP 88 - Doppler is reflecting modified systolic  Hgb 9.1 - No S/S of bleeding. Specifically denies melena/BRBPR or nosebleeds.  LDH stable at 213 with established baseline of 150- 225. Denies tea-colored urine. No power elevations noted on interrogation.   Plan: 1. RTC in 1 week for VAD visit.  2. Increase Colace to 200 mg BID 3. Pt given 80 IV Lasix in clinic along with 40 mEq of Potassium.  4. [place] nurse to place [employer] and change twice weekly.   Tanda Rockers RN [place] Coordinator   Office: [phone] 24/7 Emergency VAD Pager: [phone]

## 2017-07-06 ENCOUNTER — Encounter (HOSPITAL_COMMUNITY): Payer: Self-pay

## 2017-07-07 ENCOUNTER — Ambulatory Visit (HOSPITAL_COMMUNITY): Payer: Self-pay | Admitting: *Deleted

## 2017-07-07 ENCOUNTER — Other Ambulatory Visit (HOSPITAL_COMMUNITY): Payer: Self-pay | Admitting: *Deleted

## 2017-07-07 ENCOUNTER — Ambulatory Visit (HOSPITAL_COMMUNITY)
Admission: RE | Admit: 2017-07-07 | Discharge: 2017-07-07 | Disposition: A | Discharge status: Home / Self Care | Payer: Medicare HMO | Source: Ambulatory Visit | Attending: Cardiology | Admitting: Cardiology

## 2017-07-07 ENCOUNTER — Other Ambulatory Visit (HOSPITAL_COMMUNITY): Payer: Self-pay | Admitting: Unknown Physician Specialty

## 2017-07-07 VITALS — BP 81/46 | Wt 230.4 lb

## 2017-07-07 DIAGNOSIS — I5022 Chronic systolic (congestive) heart failure: Secondary | ICD-10-CM | POA: Insufficient documentation

## 2017-07-07 DIAGNOSIS — E119 Type 2 diabetes mellitus without complications: Secondary | ICD-10-CM | POA: Insufficient documentation

## 2017-07-07 DIAGNOSIS — Z87891 Personal history of nicotine dependence: Secondary | ICD-10-CM | POA: Insufficient documentation

## 2017-07-07 DIAGNOSIS — F329 Major depressive disorder, single episode, unspecified: Secondary | ICD-10-CM | POA: Diagnosis not present

## 2017-07-07 DIAGNOSIS — Z794 Long term (current) use of insulin: Secondary | ICD-10-CM | POA: Diagnosis not present

## 2017-07-07 DIAGNOSIS — I5043 Acute on chronic combined systolic (congestive) and diastolic (congestive) heart failure: Secondary | ICD-10-CM

## 2017-07-07 DIAGNOSIS — I5082 Biventricular heart failure: Secondary | ICD-10-CM

## 2017-07-07 DIAGNOSIS — Z7901 Long term (current) use of anticoagulants: Secondary | ICD-10-CM | POA: Insufficient documentation

## 2017-07-07 DIAGNOSIS — Z95811 Presence of heart assist device: Secondary | ICD-10-CM

## 2017-07-07 DIAGNOSIS — Z79899 Other long term (current) drug therapy: Secondary | ICD-10-CM | POA: Diagnosis not present

## 2017-07-07 DIAGNOSIS — R040 Epistaxis: Secondary | ICD-10-CM | POA: Insufficient documentation

## 2017-07-07 DIAGNOSIS — I25810 Atherosclerosis of coronary artery bypass graft(s) without angina pectoris: Secondary | ICD-10-CM | POA: Insufficient documentation

## 2017-07-07 DIAGNOSIS — F419 Anxiety disorder, unspecified: Secondary | ICD-10-CM | POA: Diagnosis not present

## 2017-07-07 DIAGNOSIS — I480 Paroxysmal atrial fibrillation: Secondary | ICD-10-CM | POA: Diagnosis not present

## 2017-07-07 DIAGNOSIS — S81801A Unspecified open wound, right lower leg, initial encounter: Secondary | ICD-10-CM | POA: Diagnosis not present

## 2017-07-07 DIAGNOSIS — J449 Chronic obstructive pulmonary disease, unspecified: Secondary | ICD-10-CM | POA: Insufficient documentation

## 2017-07-07 DIAGNOSIS — D509 Iron deficiency anemia, unspecified: Secondary | ICD-10-CM | POA: Insufficient documentation

## 2017-07-07 DIAGNOSIS — Z7982 Long term (current) use of aspirin: Secondary | ICD-10-CM | POA: Insufficient documentation

## 2017-07-07 DIAGNOSIS — E871 Hypo-osmolality and hyponatremia: Secondary | ICD-10-CM | POA: Diagnosis not present

## 2017-07-07 DIAGNOSIS — I110 Hypertensive heart disease with heart failure: Secondary | ICD-10-CM | POA: Diagnosis not present

## 2017-07-07 LAB — BASIC METABOLIC PANEL
Anion gap: 11 (ref 5–15)
BUN: 47 mg/dL — AB (ref 6–20)
CALCIUM: 8.8 mg/dL — AB (ref 8.9–10.3)
CO2: 31 mmol/L (ref 22–32)
CREATININE: 2 mg/dL — AB (ref 0.61–1.24)
Chloride: 85 mmol/L — ABNORMAL LOW (ref 101–111)
GFR calc Af Amer: 40 mL/min — ABNORMAL LOW (ref >60)
GFR, EST NON AFRICAN AMERICAN: 35 mL/min — AB (ref >60)
GLUCOSE: 173 mg/dL — AB (ref 65–99)
Potassium: 5 mmol/L (ref 3.5–5.1)
Sodium: 127 mmol/L — ABNORMAL LOW (ref 135–145)

## 2017-07-07 LAB — CBC
HEMATOCRIT: 29.8 % — AB (ref 39.0–52.0)
Hemoglobin: 9.2 g/dL — ABNORMAL LOW (ref 13.0–17.0)
MCH: 25.9 pg — AB (ref 26.0–34.0)
MCHC: 30.9 g/dL (ref 30.0–36.0)
MCV: 83.9 fL (ref 78.0–100.0)
PLATELETS: 219 10*3/uL (ref 150–400)
RBC: 3.55 MIL/uL — ABNORMAL LOW (ref 4.22–5.81)
RDW: 17.6 % — AB (ref 11.5–15.5)
WBC: 7.4 10*3/uL (ref 4.0–10.5)

## 2017-07-07 LAB — PROTIME-INR
INR: 2.06
Prothrombin Time: 23 seconds — ABNORMAL HIGH (ref 11.4–15.2)

## 2017-07-07 MED ORDER — MIDODRINE HCL 2.5 MG PO TABS: 2.5000 mg | ORAL_TABLET | Freq: Three times a day (TID) | ORAL | 6 refills | Status: DC

## 2017-07-07 NOTE — Progress Notes (Addendum)
Pt presents to clinic for nurse visit. Pt currently has a nosebleed and has nose packed with gauze. Pts weight is up 3 pounds from visit on Tuesday. Pt states that he is only eating baked chicken and frozen vegetables and that he is not exceeding more than 2 L of fluid in a day. Pt states that when he stands he feels dizzy. Sitting BP was 98/61 (72) standing BP was 81/46 (55). VAD parameters: Flow: 5.4; Speed 5900; Power 4.7; PI 2.3. Rare PI event and no alarms.  Labs drawn. Per Darrick Grinder, NP and Dr. Haroldine Laws we will start Midodrine 2.5mg  TID. Pt will return to clinic on Tuesday 8/9 for follow up. HH nurse-Sarah called and updated with this information.  Tanda Rockers RN, BSN VAD Coordinator Pager [phone] (7am - 7am)

## 2017-07-07 NOTE — Progress Notes (Signed)
Advanced Heart Failure Clinic Note    Referring Provider: Darylene Price, FNP Primary Care: [facility] Primary Cardiologist: Dr Haroldine Laws.  Transplant Center- [facility]  HPI: Ronald Miller. is a 61 y.o. male with history of chronic systolic HF s/p Med[employer]onic ICD 2014, CAD s/p CABG in 2000, HTN, Hx of cocaine abuse, Tobacco abuse, Depression, PAF, and COPD. HM3 placed 09/09/2016. In April 2018 he was placed on milrinone but later switched to dobutamine. He remains on dobutamine 7.69mg.   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  06/01/2017- Capsule Endoscopy- normal except  tiny angiectasia, nonbleeding in the right colon.  Today he returns for LVAD follow up. Earlier this week he received 80 mg IV lasix in the HF clinic in the setting of high salt diet and increased fluid intake. Compalining of dizziness when he stands. Overall feeling ok. Complaining of nose bleed. Continues on dobutamine at 7.5 mcg. Weight at home 217-220 pounds.  No driveline trauma, erythema or drainage. AHC following for home inotropes.   VAD Indication[facility]  VAD interrogation & Equipment Management: Speed: 5900 Flow: 5.4 Power: 4.7  PI: 2.3  Alarms: no clinical alarms Events: See LVAD note.  Fixed speed 5900 Low speed limit: 5600   Past Medical History:  Diagnosis Date  . AICD (automatic cardioverter/defibrillator) present   . ASCVD (arteriosclerotic cardiovascular disease)   . Chronic systolic CHF (congestive heart failure) ([place]   . COPD [place] obstructive [place] disease) [place]   . Coronary artery disease   . Depression   . Diabetes mellitus   . History of cocaine abuse   . Hypertension   . Presence of permanent cardiac pacemaker   . Shortness of breath dyspnea   . Suicidal ideation   . Tobacco abuse     Current Outpatient Prescriptions  Medication Sig Dispense Refill    . acetaminophen (TYLENOL) 500 MG tablet Take 1,000 mg by mouth every 6 (six) hours as needed for moderate pain or headache.    .Marland Kitchenamiodarone (PACERONE) 200 MG tablet Take 1 tablet (200 mg total) by mouth daily. 30 tablet 6  . blood glucose meter kit and supplies KIT Dispense based on patient and insurance preference. Use up to four times daily as directed. (FOR ICD-9 250.00, 250.01). Please include strips and lancets. 1 each 0  . citalopram (CELEXA) 20 MG tablet Take 1 tablet (20 mg total) by mouth daily. 30 tablet 6  . digoxin (DIGOX) 0.125 MG tablet Take 1 tablet (0.125 mg total) by mouth daily. 30 tablet 6  . DOBUTamine (DOBUTREX) 4-5 MG/ML-% infusion Inject 571.8 mcg/min into the vein continuous. [facility]    . docusate sodium (COLACE) 100 MG capsule Take 200 mg by mouth at bedtime.     . gabapentin (NEURONTIN) 100 MG capsule Take 2 capsules (200 mg total) by mouth 2 (two) times daily. 120 capsule 3  . insulin aspart (NOVOLOG FLEXPEN) 100 UNIT/ML FlexPen Inject 5 Units into the skin 3 (three) times daily with meals. 15 mL 11  . Insulin Glargine (LANTUS SOLOSTAR) 100 UNIT/ML Solostar Pen Inject 20 Units into the skin daily at 10 pm. (Patient taking differently: Inject 10 Units into the skin daily at 10 pm. ) 15 mL 11  . magnesium oxide (MAG-OX) 400 (241.3 Mg) MG tablet Take 1 tablet (400 mg total) by mouth daily. 30 tablet 6  . midodrine (PROAMATINE) 2.5  MG tablet Take 1 tablet (2.5 mg total) by mouth 3 (three) times daily with meals. 90 tablet 6  . Multiple Vitamin (MULTIVITAMIN WITH MINERALS) TABS tablet Take 1 tablet by mouth daily.    . mupirocin ointment (BACTROBAN) 2 % Apply 1 application topically to leg blisters 2 times daily or as needed 22 g 6  . ondansetron (ZOFRAN ODT) 4 MG disintegrating tablet Take 1 tablet (4 mg total) by mouth every 8 (eight) hours as needed for nausea. 6 tablet 0  . pantoprazole (PROTONIX) 40 MG tablet Take 1 tablet (40 mg total) by mouth 2 (two) times  daily before a meal. 60 tablet 6  . potassium chloride SA (K-DUR,KLOR-CON) 20 MEQ tablet Take 1 tablet (20 mEq total) by mouth 2 (two) times daily. 60 tablet 3  . senna-docusate (SENOKOT S) 8.6-50 MG tablet Take 2 tablets by mouth at bedtime as needed for mild constipation. 30 tablet 6  . sildenafil (REVATIO) 20 MG tablet Take 2 tablets (40 mg total) by mouth 3 (three) times daily. (Patient taking differently: Take 40 mg by mouth 2 (two) times daily. ) 120 tablet 6  . spironolactone (ALDACTONE) 25 MG tablet Take 0.5 tablets (12.5 mg total) by mouth daily. 15 tablet 6  . sucralfate (CARAFATE) 1 GM/10ML suspension Take 10 mLs (1 g total) by mouth 4 (four) times daily -  with meals and at bedtime. 420 mL 6  . torsemide (DEMADEX) 100 MG tablet Take 0.5 tablets (50 mg total) by mouth 2 (two) times daily. 30 tablet 3  . traMADol (ULTRAM) 50 MG tablet Take 1 tablet (50 mg total) by mouth 3 (three) times daily as needed for severe pain. 30 tablet 0  . warfarin (COUMADIN) 5 MG tablet Take 10 mg (2 tablets) daily except 7.5 mg (1 and 1/2 tablets) on Mon/Wed/Fri.    . zolpidem (AMBIEN) 10 MG tablet Take 1 tablet (10 mg total) by mouth at bedtime as needed for sleep. (Patient taking differently: Take 10 mg by mouth at bedtime. ) 30 tablet 2   No current facility-administered medications for this encounter.     Codeine; Trazodone and nefazodone; Lipitor [atorvastatin]; and Tape  REVIEW OF SYSTEMS: All systems negative except as listed in HPI, PMH and Problem list.    Home weights: 217-220 pounds   Vitals:   07/07/17 1430 07/07/17 1431  BP: 98/61 (!) 81/46   Maps 72 sitting --> 55 Stan[place]g.  Wt Readings from Last 3 Encounters:  07/07/17 230 lb 6.4 oz (104.5 kg)  07/05/17 227 lb 12.8 oz (103.3 kg)  06/29/17 231 lb (104.8 kg)     Physical Exam: GENERAL: Appears fatigued. Walked in the clinic.  HEENT: Nose bleed.  NECK: Supple, JVP to jaw.    2+ bilaterally, no bruits.  No lymphadenopathy or  thyromegaly appreciated.   CARDIAC:  Mechanical heart sounds with LVAD hum present.  LUNGS:  Clear to auscultation bilaterally.  ABDOMEN:  Soft, round, nontender, positive bowel sounds x4.     LVAD exit site: well-healed and incorporated.  Dressing dry and intact.  No erythema or drainage.  Stabilization device present and accurately applied.  Driveline dressing is being changed daily per sterile technique. EXTREMITIES:  Warm and dry, no cyanosis, clubbing, rash or RLE/ LLE compression wraps. NEUROLOGIC:  Alert and oriented x 4.  Gait steady.  No aphasia.  No dysarthria.  Affect pleasant.        ASSESSMENT AND PLAN: 1. Chronic end-stage biventricular systolic HF: LVEF 60% due  to ICM -> s/p Echo 08/24/16 LVEF 15%, RV mild dilated, moderately reduced. 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  Has been turned down for transplant at [facility].  - Complicated by RV failure. Initially on milrinone but stopped due to low mixed venous saturation.   Remains on dobutamine 7.5 mcg. Per [facility] .  Earlier this week he was diuresed with IV lasix. Now orthostatic. Add 2.5 mg midodrine three times a day.  - Volume status elevated but with elevated renal function, orthostatic hypotension,  and RV failure will not push diuresis. Maps low.  - Continue torsemide 50 mg twice a day. Renal function stable.  - Continue sildenafil 40 tid.    - No beta blocker with RV failure.  - VAD parameters stable.  - Warfarin with goal INR 2.0 - 2.5 + ASA 81 daily. INR 2.06.  - C- Had epistaxis with lovenox, will avoid this in the future.  2. CAD: No CP. 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  -No S/S ischemia  3. DMII:  - Per PCP 4. Tobacco abuse:  - Reports complete cessation 5. Atrial fibrillation, paroxysmal Sounds regular. Continue amio 200 daily. 6. Left pleural effusion - underwent repeat thoracentesis in Jan. 2018.  Resolved.  7.  Anxiety/Depression - Continue Celexa 61m daily . No change.   8. RUE  DVT.  Continue warfarin.  9. Hyponatremia: Sodium 127.  10. Anemia, iron-deficiency:   Has had GI work up with EGG and Capsule Endoscopy.Today hgb is 9.1.  Continue cararfate.  11. RLE- Partial thickness wound on the anterior aspect- Continue compression wrap. WBC ok.  . TSH next week . Possible RHC next week.  Amy Clegg  NP-C  2:50 PM  Patient seen and examined with ADarrick Grinder NP. We discussed all aspects of the encounter. I agree with the assessment and plan as stated above.   He remains very tenuous on IV dobutamine. Volume overloaded on exam despite recent IV lasix. Now orthostatic as well. Will start midodrine. Now with mild epistaxis. VAD parameters stable.   If not improving may need RHC and admission next week.   BGlori Bickers MD  4:37 PM

## 2017-07-11 ENCOUNTER — Other Ambulatory Visit (HOSPITAL_COMMUNITY): Payer: Self-pay | Admitting: Unknown Physician Specialty

## 2017-07-11 ENCOUNTER — Telehealth (HOSPITAL_COMMUNITY): Payer: Self-pay | Admitting: Unknown Physician Specialty

## 2017-07-11 DIAGNOSIS — Z7901 Long term (current) use of anticoagulants: Secondary | ICD-10-CM

## 2017-07-11 DIAGNOSIS — Z95811 Presence of heart assist device: Secondary | ICD-10-CM

## 2017-07-11 NOTE — Telephone Encounter (Signed)
Called pt to see how he is doing this morning since we started Midodrine last week. Pt states that he lost a lot of fluid this weekend and his ankles are much smaller today. Pt states that his dizziness is much better. We will follow up with the pt in clinic tomorrow morning at 0900.  Tanda Rockers RN, BSN VAD Coordinator Pager [phone] (7am - 7am)

## 2017-07-12 ENCOUNTER — Encounter (HOSPITAL_COMMUNITY): Payer: Self-pay

## 2017-07-12 ENCOUNTER — Ambulatory Visit (INDEPENDENT_AMBULATORY_CARE_PROVIDER_SITE_OTHER): Payer: Medicare HMO | Admitting: Family Medicine

## 2017-07-12 ENCOUNTER — Encounter: Payer: Self-pay | Admitting: Family Medicine

## 2017-07-12 ENCOUNTER — Telehealth: Payer: Self-pay | Admitting: Family Medicine

## 2017-07-12 ENCOUNTER — Ambulatory Visit: Payer: Self-pay | Admitting: Family Medicine

## 2017-07-12 ENCOUNTER — Telehealth (HOSPITAL_COMMUNITY): Payer: Self-pay | Admitting: *Deleted

## 2017-07-12 VITALS — HR 60 | Temp 98.0°F | Wt 232.0 lb

## 2017-07-12 DIAGNOSIS — R197 Diarrhea, unspecified: Secondary | ICD-10-CM | POA: Diagnosis not present

## 2017-07-12 DIAGNOSIS — Z95811 Presence of heart assist device: Secondary | ICD-10-CM

## 2017-07-12 DIAGNOSIS — E1169 Type 2 diabetes mellitus with other specified complication: Secondary | ICD-10-CM

## 2017-07-12 DIAGNOSIS — Z794 Long term (current) use of insulin: Secondary | ICD-10-CM | POA: Diagnosis not present

## 2017-07-12 LAB — BASIC METABOLIC PANEL
BUN: 31 mg/dL — AB (ref 6–23)
CALCIUM: 9 mg/dL (ref 8.4–10.5)
CHLORIDE: 91 meq/L — AB (ref 96–112)
CO2: 33 meq/L — AB (ref 19–32)
Creatinine, Ser: 1.51 mg/dL — ABNORMAL HIGH (ref 0.40–1.50)
GFR: 50.23 mL/min — ABNORMAL LOW (ref >60.00)
GLUCOSE: 170 mg/dL — AB (ref 70–99)
Potassium: 4.4 mEq/L (ref 3.5–5.1)
Sodium: 130 mEq/L — ABNORMAL LOW (ref 135–145)

## 2017-07-12 NOTE — Assessment & Plan Note (Signed)
Patient with recent onset diarrhea. Has some nausea as well. No vomiting. Suspect food borne illness versus viral illness. Benign abdominal exam. He is well-appearing. LVAD insertion site appears to have no infection. Patient is fluid restricted given his heart failure. He does not appear dehydrated. His lightheadedness has improved since last week with the midodrine. Discussed that he should not take Imodium as this can lead to retaining the cause of his diarrhea and could result in more complex intestinal issues. We will check a BMP to see if he has tolerated the diarrhea. Discussed that if his creatinine had significantly gone up further than prior he would likely need hospital admission. He has lab work tomorrow as well for recheck. He's given return precautions.

## 2017-07-12 NOTE — Telephone Encounter (Signed)
Renal function and sodium improved. Informed patient. Discussed that he needs to keep his appointment tomorrow with heart failure clinic. He voiced understanding. If anything changes he will be reevaluated.

## 2017-07-12 NOTE — Assessment & Plan Note (Signed)
Following with advanced heart failure clinic at [facility]. He will keep his appointment with them tomorrow. Appears to have improved quite a bit since last week per his report. Reports swelling is much improved and lightheadedness is significantly improved as well. Discussed if he has worsening symptoms or if he develops chest pain or shortness of breath or any new symptoms prior to tomorrow he should be evaluated sooner.

## 2017-07-12 NOTE — Telephone Encounter (Signed)
Received page from patient stating he has a "GI" bug and wishes to reschedule his follow up appointment. States he feels great otherwise. We will call patient later to reschedule.  Balinda Quails RN, VAD Coordinator 24/7 pager [phone]

## 2017-07-12 NOTE — Assessment & Plan Note (Signed)
Currently on insulin. Reports blood sugars improved from prior. He will have and A1c though he is not due until tomorrow. Continue current regimen at this time.
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
## 2017-07-12 NOTE — Patient Instructions (Addendum)
Nice to see you. Please see cardiology tomorrow. Please do not take any more Imodium. We will check your kidney function today just to ensure that it has not worsened. If you develop worsening lightheadedness or you develop chest pain, shortness of breath, worsening swelling, abdominal pain, persistent diarrhea, or any new or change in symptoms please be evaluated immediately.

## 2017-07-13 ENCOUNTER — Encounter: Payer: Self-pay | Admitting: *Deleted

## 2017-07-13 ENCOUNTER — Ambulatory Visit (HOSPITAL_COMMUNITY)
Admission: RE | Admit: 2017-07-13 | Discharge: 2017-07-13 | Disposition: A | Discharge status: Home / Self Care | Payer: Medicare HMO | Source: Ambulatory Visit | Attending: Internal Medicine | Admitting: Internal Medicine

## 2017-07-13 ENCOUNTER — Encounter (HOSPITAL_COMMUNITY): Payer: Self-pay

## 2017-07-13 ENCOUNTER — Ambulatory Visit (HOSPITAL_COMMUNITY): Payer: Self-pay | Admitting: Pharmacist

## 2017-07-13 VITALS — BP 109/66 | HR 92 | Resp 16 | Ht 69.0 in | Wt 228.0 lb

## 2017-07-13 DIAGNOSIS — Z7982 Long term (current) use of aspirin: Secondary | ICD-10-CM | POA: Insufficient documentation

## 2017-07-13 DIAGNOSIS — J449 Chronic obstructive pulmonary disease, unspecified: Secondary | ICD-10-CM | POA: Insufficient documentation

## 2017-07-13 DIAGNOSIS — I5022 Chronic systolic (congestive) heart failure: Secondary | ICD-10-CM | POA: Diagnosis not present

## 2017-07-13 DIAGNOSIS — F1410 Cocaine abuse, uncomplicated: Secondary | ICD-10-CM | POA: Diagnosis not present

## 2017-07-13 DIAGNOSIS — Z95810 Presence of automatic (implantable) cardiac defibrillator: Secondary | ICD-10-CM | POA: Diagnosis not present

## 2017-07-13 DIAGNOSIS — Z794 Long term (current) use of insulin: Secondary | ICD-10-CM | POA: Insufficient documentation

## 2017-07-13 DIAGNOSIS — Z950 Presence of cardiac pacemaker: Secondary | ICD-10-CM | POA: Diagnosis not present

## 2017-07-13 DIAGNOSIS — E1169 Type 2 diabetes mellitus with other specified complication: Secondary | ICD-10-CM

## 2017-07-13 DIAGNOSIS — I2510 Atherosclerotic heart disease of native coronary artery without angina pectoris: Secondary | ICD-10-CM | POA: Diagnosis not present

## 2017-07-13 DIAGNOSIS — S81801A Unspecified open wound, right lower leg, initial encounter: Secondary | ICD-10-CM

## 2017-07-13 DIAGNOSIS — Z7901 Long term (current) use of anticoagulants: Secondary | ICD-10-CM | POA: Diagnosis not present

## 2017-07-13 DIAGNOSIS — Z720 Tobacco use: Secondary | ICD-10-CM | POA: Insufficient documentation

## 2017-07-13 DIAGNOSIS — Z951 Presence of aortocoronary bypass graft: Secondary | ICD-10-CM | POA: Diagnosis not present

## 2017-07-13 DIAGNOSIS — I110 Hypertensive heart disease with heart failure: Secondary | ICD-10-CM | POA: Diagnosis not present

## 2017-07-13 DIAGNOSIS — K319 Disease of stomach and duodenum, unspecified: Secondary | ICD-10-CM | POA: Insufficient documentation

## 2017-07-13 DIAGNOSIS — R040 Epistaxis: Secondary | ICD-10-CM | POA: Diagnosis not present

## 2017-07-13 DIAGNOSIS — Z95811 Presence of heart assist device: Secondary | ICD-10-CM | POA: Diagnosis not present

## 2017-07-13 DIAGNOSIS — D509 Iron deficiency anemia, unspecified: Secondary | ICD-10-CM | POA: Insufficient documentation

## 2017-07-13 DIAGNOSIS — I480 Paroxysmal atrial fibrillation: Secondary | ICD-10-CM | POA: Insufficient documentation

## 2017-07-13 DIAGNOSIS — E119 Type 2 diabetes mellitus without complications: Secondary | ICD-10-CM | POA: Diagnosis not present

## 2017-07-13 DIAGNOSIS — E871 Hypo-osmolality and hyponatremia: Secondary | ICD-10-CM | POA: Insufficient documentation

## 2017-07-13 DIAGNOSIS — F329 Major depressive disorder, single episode, unspecified: Secondary | ICD-10-CM | POA: Diagnosis not present

## 2017-07-13 LAB — CBC
HCT: 29 % — ABNORMAL LOW (ref 39.0–52.0)
Hemoglobin: 8.8 g/dL — ABNORMAL LOW (ref 13.0–17.0)
MCH: 25.1 pg — ABNORMAL LOW (ref 26.0–34.0)
MCHC: 30.3 g/dL (ref 30.0–36.0)
MCV: 82.9 fL (ref 78.0–100.0)
PLATELETS: 191 10*3/uL (ref 150–400)
RBC: 3.5 MIL/uL — ABNORMAL LOW (ref 4.22–5.81)
RDW: 17.8 % — AB (ref 11.5–15.5)
WBC: 7.2 10*3/uL (ref 4.0–10.5)

## 2017-07-13 LAB — BASIC METABOLIC PANEL
ANION GAP: 11 (ref 5–15)
BUN: 29 mg/dL — ABNORMAL HIGH (ref 6–20)
CALCIUM: 8.7 mg/dL — AB (ref 8.9–10.3)
CO2: 28 mmol/L (ref 22–32)
CREATININE: 1.66 mg/dL — AB (ref 0.61–1.24)
Chloride: 90 mmol/L — ABNORMAL LOW (ref 101–111)
GFR, EST AFRICAN AMERICAN: 50 mL/min — AB (ref >60)
GFR, EST NON AFRICAN AMERICAN: 43 mL/min — AB (ref >60)
Glucose, Bld: 185 mg/dL — ABNORMAL HIGH (ref 65–99)
Potassium: 4.7 mmol/L (ref 3.5–5.1)
SODIUM: 129 mmol/L — AB (ref 135–145)

## 2017-07-13 LAB — PROTIME-INR
INR: 1.89
Prothrombin Time: 21.5 seconds — ABNORMAL HIGH (ref 11.4–15.2)

## 2017-07-13 LAB — HEMOGLOBIN A1C
Hgb A1c MFr Bld: 6.5 % — ABNORMAL HIGH (ref 4.8–5.6)
Mean Plasma Glucose: 139.85 mg/dL

## 2017-07-13 LAB — LACTATE DEHYDROGENASE: LDH: 182 U/L (ref 98–192)

## 2017-07-13 IMAGING — CR DG CHEST 2V
2 series · 2 of 2 positions shown · non-contrast
Comparison: Chest radiograph performed 02/09/2016

CLINICAL DATA: Acute onset of shortness of breath and mid chest
pain. Back pain. Initial encounter.

EXAM:
CHEST  2 VIEW

[chest pa]
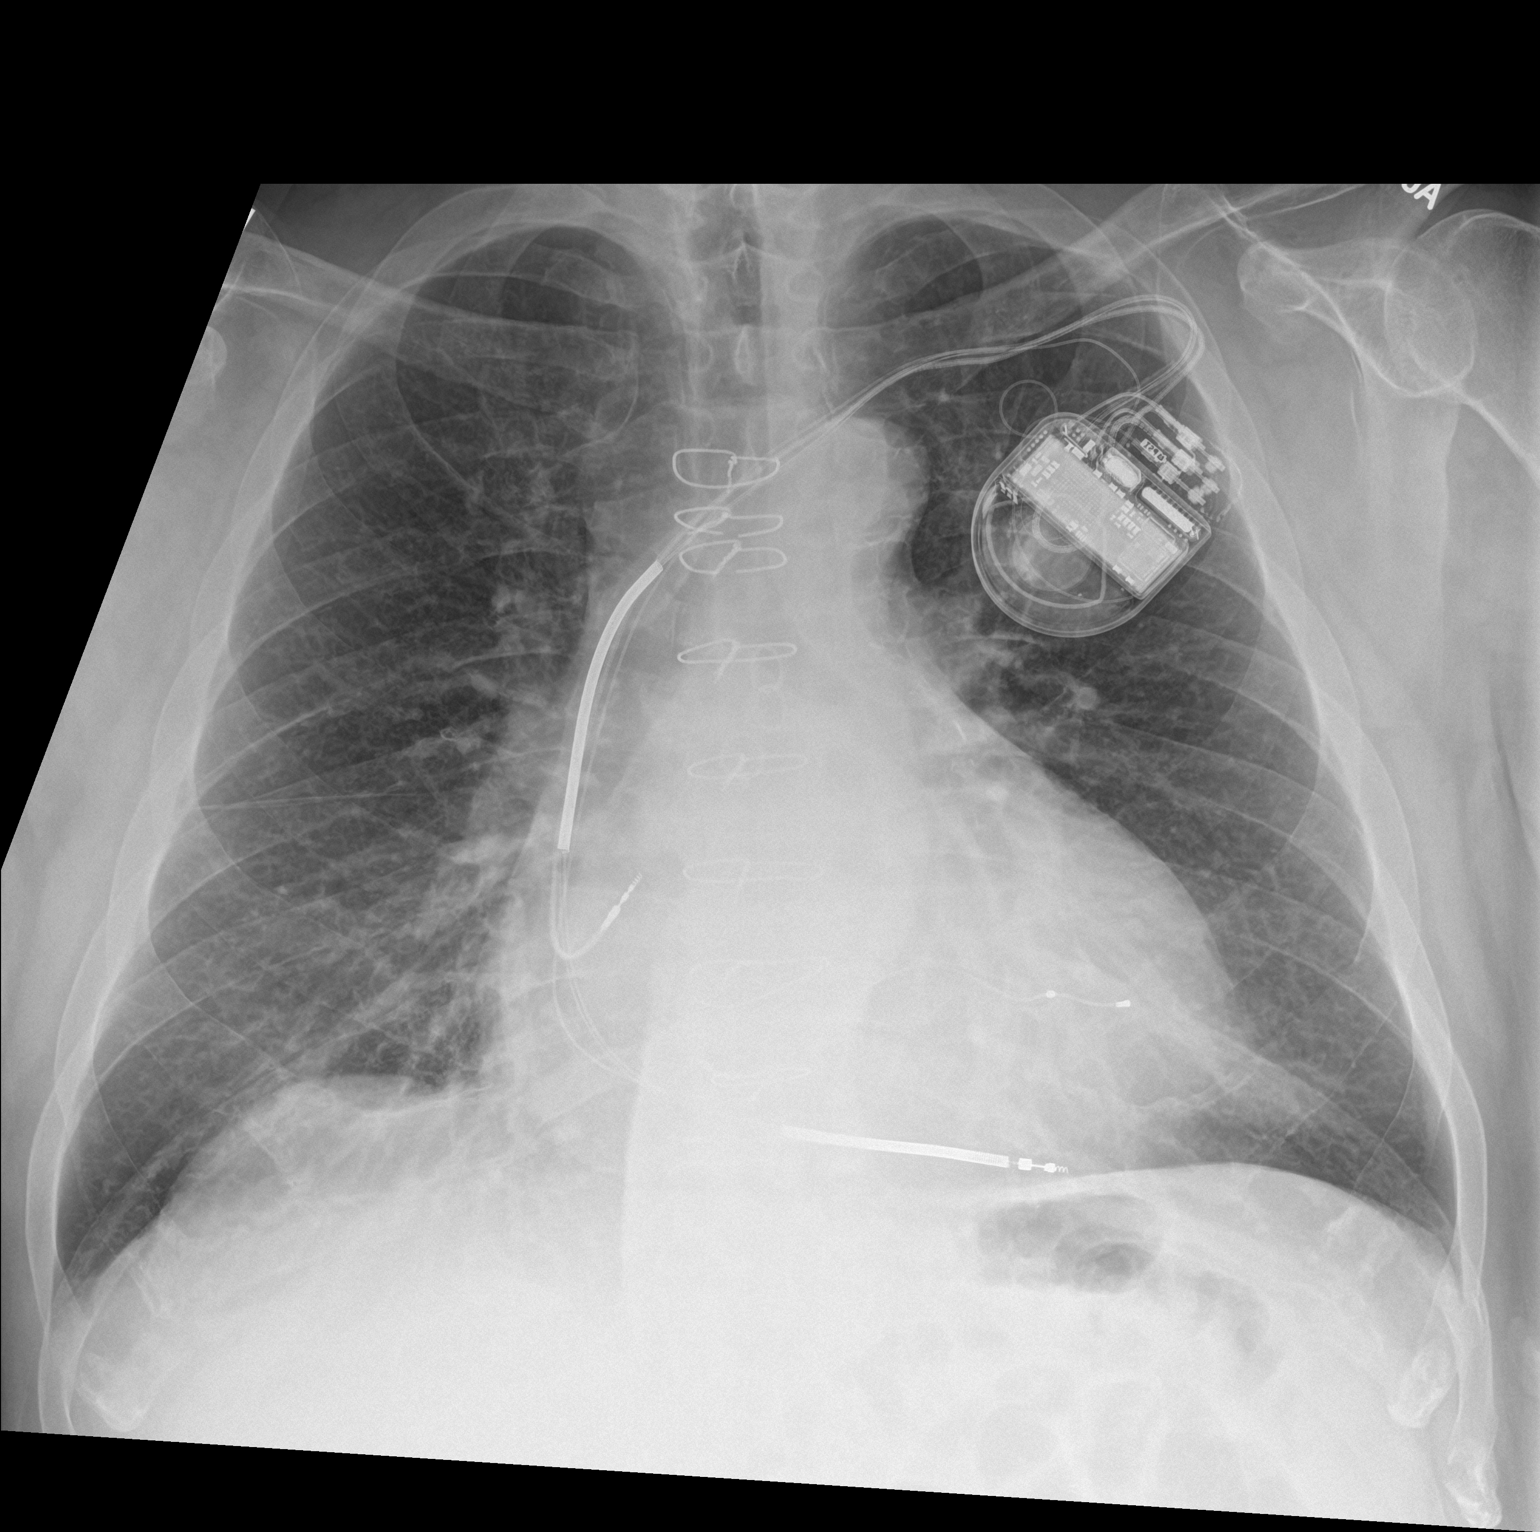

[chest lat]
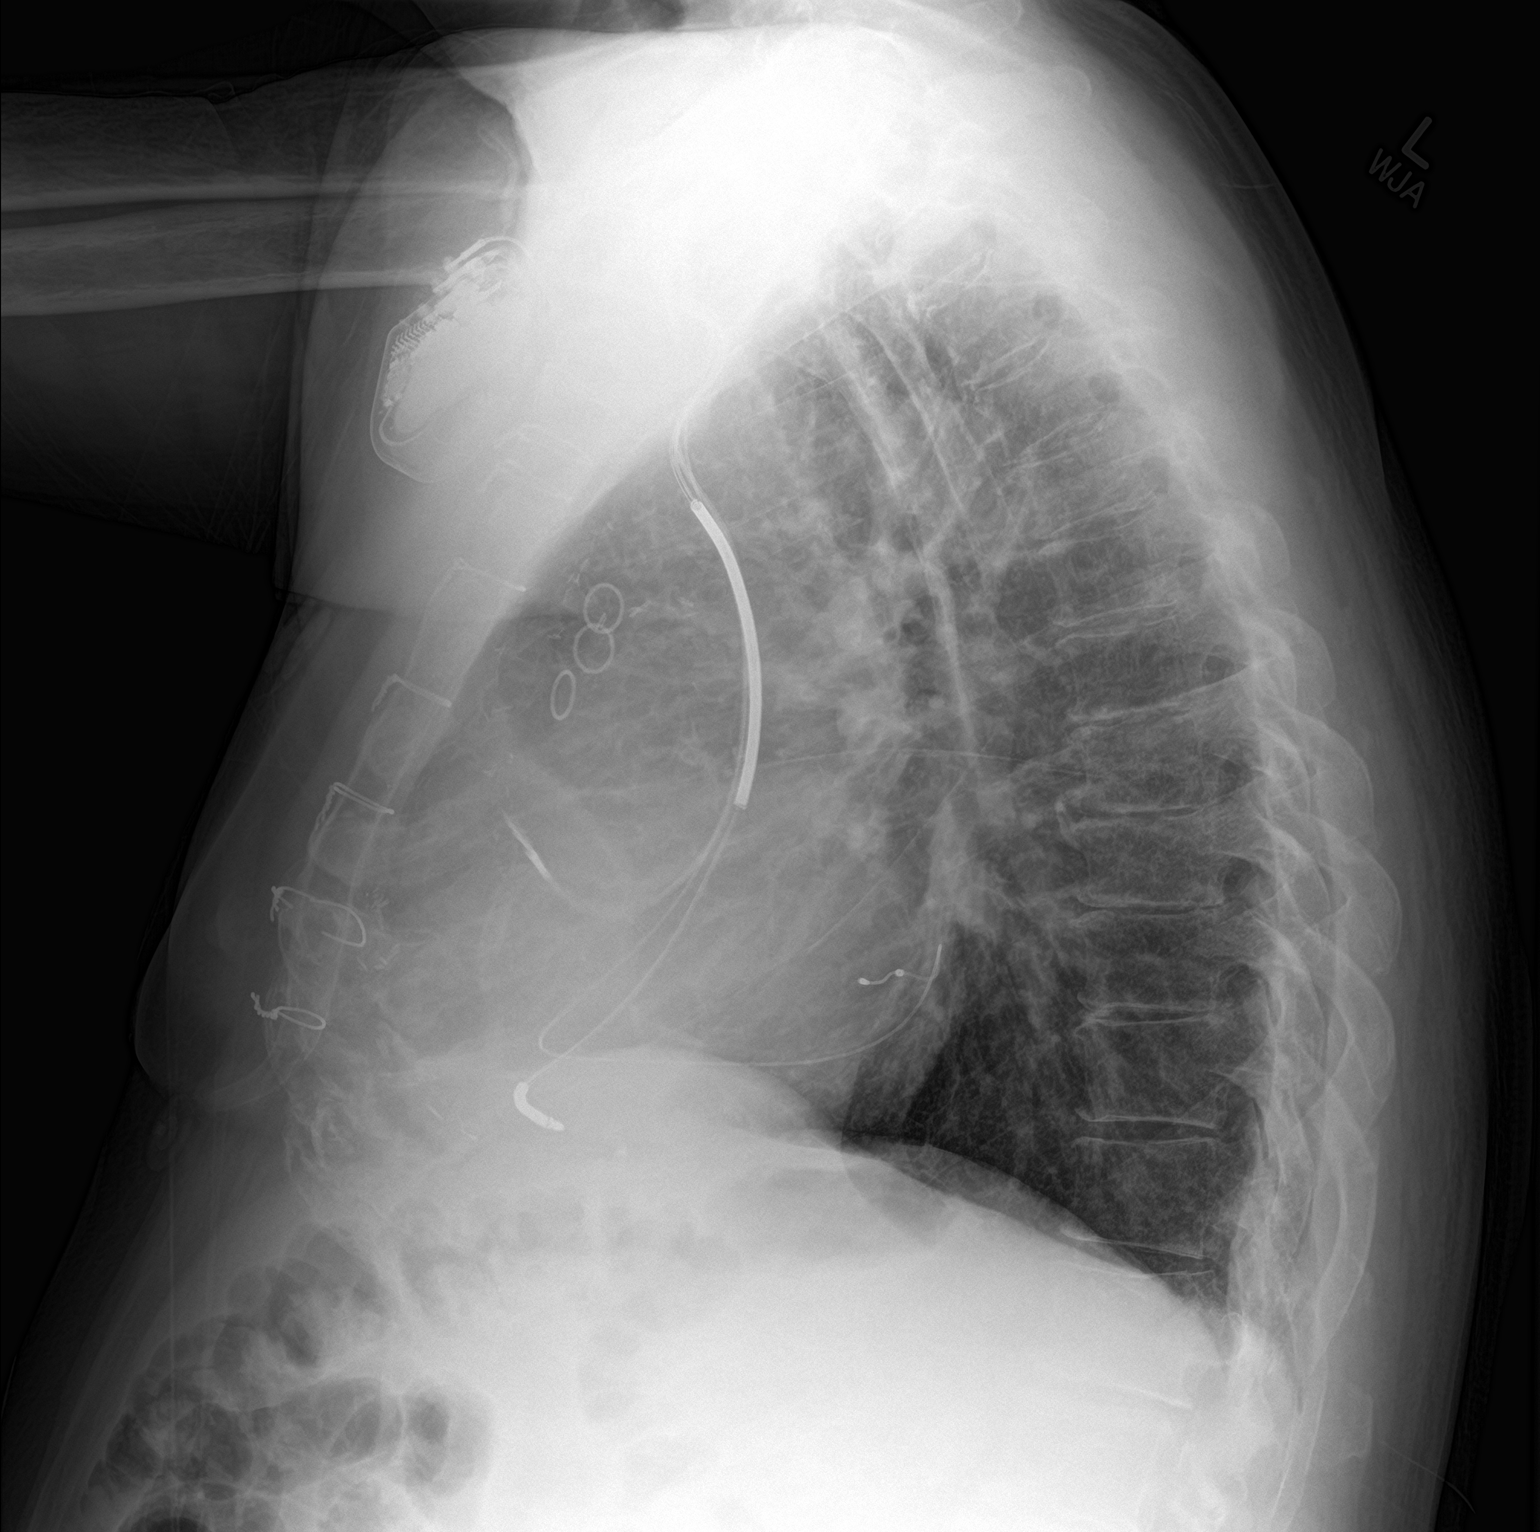

[2 of 2 positions shown; findings below may reference images not displayed]

FINDINGS: The lungs are well-aerated. Mild vascular congestion is noted. There
is no evidence of focal opacification, pleural effusion or
pneumothorax.

The heart is mildly enlarged. The patient is status post median
sternotomy. A pacemaker/AICD is noted at the left chest wall, with
leads ending at the right atrium, right ventricle and coronary
sinus. No acute osseous abnormalities are seen.
IMPRESSION: Mild cardiomegaly. Mild vascular congestion, without significant
pulmonary edema.

## 2017-07-13 NOTE — Patient Instructions (Signed)
For two days- take 100 mg of Torsemide in the morning and 50 mg of torsemide in the evening. Then resume 50mg  twice daily.  Return to clinic in 1 week.  Keep eating right and go to cardiac rehab!!

## 2017-07-13 NOTE — Progress Notes (Signed)
[place] and Mr. Strausser has not been filling sildenafil for 4 months. Per discussion with Dr. Haroldine Laws, will d/c at this time.   Ruta Hinds. Velva Harman, PharmD, BCPS, CPP Clinical Pharmacist Pager: [phone] Phone: [phone] 07/13/2017 10:44 AM

## 2017-07-13 NOTE — Progress Notes (Signed)
Cardiac Individual Treatment Plan  Patient Details  Name: Ronald Miller. MRN: [id number] Date of Birth: [birth year] Referring Provider:     Cardiac Rehab from 06/29/2017 in [facility] Cardiac and Pulmonary Rehab  Referring Provider  Glori Bickers MD      Initial Encounter Date:    Cardiac Rehab from 06/29/2017 in [facility] Cardiac and Pulmonary Rehab  Date  06/29/17  Referring Provider  Glori Bickers MD      Visit Diagnosis: No diagnosis found.  Patient's Home Medications on Admission:  Current Outpatient Prescriptions:  .  acetaminophen (TYLENOL) 500 MG tablet, Take 1,000 mg by mouth every 6 (six) hours as needed for moderate pain or headache., Disp: , Rfl:  .  amiodarone (PACERONE) 200 MG tablet, Take 1 tablet (200 mg total) by mouth daily., Disp: 30 tablet, Rfl: 6 .  blood glucose meter kit and supplies KIT, Dispense based on patient and insurance preference. Use up to four times daily as directed. (FOR ICD-9 250.00, 250.01). Please include strips and lancets., Disp: 1 each, Rfl: 0 .  citalopram (CELEXA) 20 MG tablet, Take 1 tablet (20 mg total) by mouth daily., Disp: 30 tablet, Rfl: 6 .  digoxin (DIGOX) 0.125 MG tablet, Take 1 tablet (0.125 mg total) by mouth daily., Disp: 30 tablet, Rfl: 6 .  DOBUTamine (DOBUTREX) 4-5 MG/ML-% infusion, Inject 571.8 mcg/min into the vein continuous. PER [facility] pharmacy, Disp: , Rfl:  .  docusate sodium (COLACE) 100 MG capsule, Take 200 mg by mouth at bedtime. , Disp: , Rfl:  .  gabapentin (NEURONTIN) 100 MG capsule, Take 2 capsules (200 mg total) by mouth 2 (two) times daily., Disp: 120 capsule, Rfl: 3 .  insulin aspart (NOVOLOG FLEXPEN) 100 UNIT/ML FlexPen, Inject 5 Units into the skin 3 (three) times daily with meals., Disp: 15 mL, Rfl: 11 .  Insulin Glargine (LANTUS SOLOSTAR) 100 UNIT/ML Solostar Pen, Inject 20 Units into the skin daily at 10 pm., Disp: 15 mL, Rfl: 11 .  magnesium oxide (MAG-OX) 400 (241.3 Mg) MG tablet, Take 1 tablet (400 mg total)  by mouth daily., Disp: 30 tablet, Rfl: 6 .  midodrine (PROAMATINE) 2.5 MG tablet, Take 1 tablet (2.5 mg total) by mouth 3 (three) times daily with meals., Disp: 90 tablet, Rfl: 6 .  Multiple Vitamin (MULTIVITAMIN WITH MINERALS) TABS tablet, Take 1 tablet by mouth daily., Disp: , Rfl:  .  mupirocin ointment (BACTROBAN) 2 %, Apply 1 application topically to leg blisters 2 times daily or as needed, Disp: 22 g, Rfl: 6 .  ondansetron (ZOFRAN ODT) 4 MG disintegrating tablet, Take 1 tablet (4 mg total) by mouth every 8 (eight) hours as needed for nausea., Disp: 6 tablet, Rfl: 0 .  pantoprazole (PROTONIX) 40 MG tablet, Take 1 tablet (40 mg total) by mouth 2 (two) times daily before a meal., Disp: 60 tablet, Rfl: 6 .  potassium chloride SA (K-DUR,KLOR-CON) 20 MEQ tablet, Take 1 tablet (20 mEq total) by mouth 2 (two) times daily., Disp: 60 tablet, Rfl: 3 .  senna-docusate (SENOKOT S) 8.6-50 MG tablet, Take 2 tablets by mouth at bedtime as needed for mild constipation., Disp: 30 tablet, Rfl: 6 .  spironolactone (ALDACTONE) 25 MG tablet, Take 0.5 tablets (12.5 mg total) by mouth daily., Disp: 15 tablet, Rfl: 6 .  sucralfate (CARAFATE) 1 GM/10ML suspension, Take 10 mLs (1 g total) by mouth 4 (four) times daily -  with meals and at bedtime., Disp: 420 mL, Rfl: 6 .  torsemide (DEMADEX) 100 MG  tablet, Take 0.5 tablets (50 mg total) by mouth 2 (two) times daily., Disp: 30 tablet, Rfl: 3 .  traMADol (ULTRAM) 50 MG tablet, Take 1 tablet (50 mg total) by mouth 3 (three) times daily as needed for severe pain., Disp: 30 tablet, Rfl: 0 .  warfarin (COUMADIN) 5 MG tablet, Take 10 mg (2 tablets) daily except 7.5 mg (1 and 1/2 tablets) on Mon/Wed/Fri., Disp: , Rfl:  .  zolpidem (AMBIEN) 10 MG tablet, Take 1 tablet (10 mg total) by mouth at bedtime as needed for sleep., Disp: 30 tablet, Rfl: 2  Past Medical History: Past Medical History:  Diagnosis Date  . AICD (automatic cardioverter/defibrillator) present   . ASCVD  (arteriosclerotic cardiovascular disease)   . Chronic systolic CHF (congestive heart failure) ([place])   . COPD (chronic obstructive pulmonary disease) ([place])   . Coronary artery disease   . Depression   . Diabetes mellitus   . History of cocaine abuse   . Hypertension   . Presence of permanent cardiac pacemaker   . Shortness of breath dyspnea   . Suicidal ideation   . Tobacco abuse     Tobacco Use: History  Smoking Status  . Former Smoker  . Packs/day: 1.50  . Years: 23.00  . Types: Cigarettes  . Start date: 10/24/1992  . Quit date: 07/01/2016  Smokeless Tobacco  . Never Used    Labs: Recent Review Flowsheet Data    Labs for ITP Cardiac and Pulmonary Rehab Latest Ref Rng & Units 06/12/2017 06/13/2017 06/14/2017 06/15/2017 07/13/2017   Cholestrol 0 - 200 mg/dL - - - - -   LDLCALC 0 - 100 mg/dL - - - - -   HDL 40 - 60 mg/dL - - - - -   Trlycerides 0 - 200 mg/dL - - - - -   Hemoglobin A1c 4.8 - 5.6 % - - - - 6.5(H)   PHART 7.350 - 7.450 - - - - -   PCO2ART 32.0 - 48.0 mmHg - - - - -   HCO3 20.0 - 28.0 mmol/L - - - - -   TCO2 0 - 100 mmol/L - - - - -   ACIDBASEDEF 0.0 - 2.0 mmol/L - - - - -   O2SAT % 55.8 68.4 64.3 54.9 -       Exercise Target Goals:    Exercise Program Goal: Individual exercise prescription set with THRR, safety & activity barriers. Participant demonstrates ability to understand and report RPE using BORG scale, to self-measure pulse accurately, and to acknowledge the importance of the exercise prescription.  Exercise Prescription Goal: Starting with aerobic activity 30 plus minutes a day, 3 days per week for initial exercise prescription. Provide home exercise prescription and guidelines that participant acknowledges understanding prior to discharge.  Activity Barriers & Risk Stratification:     Activity Barriers & Cardiac Risk Stratification - 06/29/17 0936      Activity Barriers & Cardiac Risk Stratification   Activity Barriers History of  Falls;Balance Concerns;Assistive Device;Deconditioning;Muscular Weakness;Shortness of Breath;Decreased Ventricular Function;Back Problems   Cardiac Risk Stratification High      6 Minute Walk:     6 Minute Walk    Row Name 06/29/17 0930         6 Minute Walk   Phase Initial     Distance 635 feet     Walk Time 4.48 minutes     # of Rest Breaks 1  1:31     MPH 1.61  METS 1.44     RPE 13     Perceived Dyspnea  3     VO2 Peak 5.04     Symptoms Yes (comment)     Comments SOB, chest soreness from walking (Chronic)     Resting HR 59 bpm     Resting BP -  84 dopplar     Resting Oxygen Saturation  94 %     Exercise Oxygen Saturation  during 6 min walk 93 %     Max Ex. HR 61 bpm     Max Ex. BP -  90 dopplar       Interval Oxygen   Interval Oxygen? Yes     Baseline Oxygen Saturation % 94 %     1 Minute Oxygen Saturation % 95 %     1 Minute Liters of Oxygen 0 L  room air     2 Minute Oxygen Saturation % 93 %     2 Minute Liters of Oxygen 0 L     3 Minute Oxygen Saturation % 96 %     3 Minute Liters of Oxygen 0 L     4 Minute Oxygen Saturation % 96 %     4 Minute Liters of Oxygen 0 L     5 Minute Oxygen Saturation % 94 %     5 Minute Liters of Oxygen 0 L     6 Minute Oxygen Saturation % 94 %     6 Minute Liters of Oxygen 0 L     2 Minute Post Oxygen Saturation % 97 %     2 Minute Post Liters of Oxygen 0 L        Oxygen Initial Assessment:   Oxygen Re-Evaluation:   Oxygen Discharge (Final Oxygen Re-Evaluation):   Initial Exercise Prescription:     Initial Exercise Prescription - 06/29/17 0900      Date of Initial Exercise RX and Referring Provider   Date 06/29/17   Referring Provider Glori Bickers MD     Treadmill   MPH 1   Grade 0   Minutes 15   METs 1.77     NuStep   Level 1   SPM 80   Minutes 15   METs 1.4     Biostep-RELP   Level 1   SPM 50   Minutes 15   METs 2     Prescription Details   Frequency (times per week) 3    Duration Progress to 45 minutes of aerobic exercise without signs/symptoms of physical distress     Intensity   THRR 40-80% of Max Heartrate 99-140   Ratings of Perceived Exertion 11-13   Perceived Dyspnea 0-4     Progression   Progression Continue to progress workloads to maintain intensity without signs/symptoms of physical distress.     Resistance Training   Training Prescription Yes   Weight 2 lbs   Reps 10-15      Perform Capillary Blood Glucose checks as needed.  Exercise Prescription Changes:     Exercise Prescription Changes    Row Name 06/29/17 0900             Response to Exercise   Blood Pressure (Admit) -  84 dopplar       Blood Pressure (Exercise) -  90 dopplar       Blood Pressure (Exit) -  68 dopplar       Heart Rate (Admit) 59 bpm       Heart  Rate (Exercise) 61 bpm       Oxygen Saturation (Admit) 94 %       Oxygen Saturation (Exercise) 93 %       Oxygen Saturation (Exit) 97 %       Rating of Perceived Exertion (Exercise) 13       Perceived Dyspnea (Exercise) 3       Symptoms SOB, chest soreness       Comments walk test results and first full day of exercise          Exercise Comments:     Exercise Comments    Row Name 06/29/17 0751           Exercise Comments First full day of exercise!  Patient was oriented to gym and equipment including functions, settings, policies, and procedures.  Patient's individual exercise prescription and treatment plan were reviewed.  All starting workloads were established based on the results of the 6 minute walk test done at initial orientation visit.  The plan for exercise progression was also introduced and progression will be customized based on patient's performance and goals.          Exercise Goals and Review:     Exercise Goals    Row Name 06/29/17 0751             Exercise Goals   Increase Physical Activity Yes       Intervention Provide advice, education, support and counseling about  physical activity/exercise needs.;Develop an individualized exercise prescription for aerobic and resistive training based on initial evaluation findings, risk stratification, comorbidities and participant's personal goals.       Expected Outcomes Achievement of increased cardiorespiratory fitness and enhanced flexibility, muscular endurance and strength shown through measurements of functional capacity and personal statement of participant.       Increase Strength and Stamina Yes       Intervention Provide advice, education, support and counseling about physical activity/exercise needs.;Develop an individualized exercise prescription for aerobic and resistive training based on initial evaluation findings, risk stratification, comorbidities and participant's personal goals.       Expected Outcomes Achievement of increased cardiorespiratory fitness and enhanced flexibility, muscular endurance and strength shown through measurements of functional capacity and personal statement of participant.       Able to understand and use rate of perceived exertion (RPE) scale Yes       Intervention Provide education and explanation on how to use RPE scale       Expected Outcomes Short Term: Able to use RPE daily in rehab to express subjective intensity level;Long Term:  Able to use RPE to guide intensity level when exercising independently       Able to understand and use Dyspnea scale Yes       Intervention Provide education and explanation on how to use Dyspnea scale       Expected Outcomes Short Term: Able to use Dyspnea scale daily in rehab to express subjective sense of shortness of breath during exertion;Long Term: Able to use Dyspnea scale to guide intensity level when exercising independently       Knowledge and understanding of Target Heart Rate Range (THRR) Yes       Intervention Provide education and explanation of THRR including how the numbers were predicted and where they are located for reference        Expected Outcomes Short Term: Able to state/look up THRR;Long Term: Able to use THRR to govern intensity when exercising independently;Short Term: Able  to use daily as guideline for intensity in rehab       Able to check pulse independently -  patient has an LVAD       Understanding of Exercise Prescription Yes       Intervention Provide education, explanation, and written materials on patient's individual exercise prescription       Expected Outcomes Short Term: Able to explain program exercise prescription;Long Term: Able to explain home exercise prescription to exercise independently          Exercise Goals Re-Evaluation :   Discharge Exercise Prescription (Final Exercise Prescription Changes):     Exercise Prescription Changes - 06/29/17 0900      Response to Exercise   Blood Pressure (Admit) --  84 dopplar   Blood Pressure (Exercise) --  90 dopplar   Blood Pressure (Exit) --  68 dopplar   Heart Rate (Admit) 59 bpm   Heart Rate (Exercise) 61 bpm   Oxygen Saturation (Admit) 94 %   Oxygen Saturation (Exercise) 93 %   Oxygen Saturation (Exit) 97 %   Rating of Perceived Exertion (Exercise) 13   Perceived Dyspnea (Exercise) 3   Symptoms SOB, chest soreness   Comments walk test results and first full day of exercise      Nutrition:  Target Goals: Understanding of nutrition guidelines, daily intake of sodium <1588m, cholesterol <2021m calories 30% from fat and 7% or less from saturated fats, daily to have 5 or more servings of fruits and vegetables.  Biometrics:     Pre Biometrics - 06/29/17 0941      Pre Biometrics   Height 5' 9.75" (1.772 m)   Weight 231 lb (104.8 kg)   Waist Circumference 45 inches   Hip Circumference 43.5 inches   Waist to Hip Ratio 1.03 %   BMI (Calculated) 33.37   Single Leg Stand 12.89 seconds       Nutrition Therapy Plan and Nutrition Goals:     Nutrition Therapy & Goals - 06/29/17 0945      Intervention Plan   Intervention  Prescribe, educate and counsel regarding individualized specific dietary modifications aiming towards targeted core components such as weight, hypertension, lipid management, diabetes, heart failure and other comorbidities.   Expected Outcomes Short Term Goal: Understand basic principles of dietary content, such as calories, fat, sodium, cholesterol and nutrients.;Long Term Goal: Adherence to prescribed nutrition plan.;Short Term Goal: A plan has been developed with personal nutrition goals set during dietitian appointment.      Nutrition Discharge: Rate Your Plate Scores:     Nutrition Assessments - 06/29/17 0946      MEDFICTS Scores   Pre Score 6      Nutrition Goals Re-Evaluation:   Nutrition Goals Discharge (Final Nutrition Goals Re-Evaluation):   Psychosocial: Target Goals: Acknowledge presence or absence of significant depression and/or stress, maximize coping skills, provide positive support system. Participant is able to verbalize types and ability to use techniques and skills needed for reducing stress and depression.   Initial Review & Psychosocial Screening:     Initial Psych Review & Screening - 06/29/17 0946      Initial Review   Current issues with Current Depression;History of Depression;Current Anxiety/Panic;Current Psychotropic Meds;Current Stress Concerns   Source of Stress Concerns Chronic Illness;Poor Coping Skills;Family;Unable to participate in former interests or hobbies;Unable to perform yard/household activities;Financial;Transportation;Retirement/disability   Comments [name] has a lot going on.  Has limited support system that have been in and out of life.  Family Dynamics   Good Support System? Yes     Barriers   Psychosocial barriers to participate in program The patient should benefit from training in stress management and relaxation.;Psychosocial barriers identified (see note)     Screening Interventions   Interventions Yes;Encouraged to  exercise;Program counselor consult;Provide feedback about the scores to participant;To provide support and resources with identified psychosocial needs   Expected Outcomes Short Term goal: Utilizing psychosocial counselor, staff and physician to assist with identification of specific Stressors or current issues interfering with healing process. Setting desired goal for each stressor or current issue identified.;Long Term Goal: Stressors or current issues are controlled or eliminated.;Short Term goal: Identification and review with participant of any Quality of Life or Depression concerns found by scoring the questionnaire.;Long Term goal: The participant improves quality of Life and PHQ9 Scores as seen by post scores and/or verbalization of changes      Quality of Life Scores:      Quality of Life - 06/29/17 0948      Quality of Life Scores   Health/Function Pre 18.68 %   Socioeconomic Pre 20.14 %   Psych/Spiritual Pre 24 %   Family Pre 22.25 %   GLOBAL Pre 20.61 %      PHQ-9: Recent Review Flowsheet Data    Depression screen [facility] 2/9 06/29/2017 11/23/2016   Decreased Interest 0 1   Down, Depressed, Hopeless 0 1   PHQ - 2 Score 0 2   Altered sleeping 2 2   Tired, decreased energy 2 1   Change in appetite 0 0   Feeling bad or failure about yourself  0 2   Trouble concentrating 0 2   Moving slowly or fidgety/restless 0 1   Suicidal thoughts 0 0   PHQ-9 Score 4 10   Difficult doing work/chores Somewhat difficult Not difficult at all     Interpretation of Total Score  Total Score Depression Severity:  1-4 = Minimal depression, 5-9 = Mild depression, 10-14 = Moderate depression, 15-19 = Moderately severe depression, 20-27 = Severe depression   Psychosocial Evaluation and Intervention:     Psychosocial Evaluation - 06/29/17 0932      Psychosocial Evaluation & Interventions   Comments Counselor met with Mr. [facility]) today for initial psychosocial evaluation -as he has returned  to this program for the 2nd time this year.  He is a 61 year old who had a LVAD procedure on 09/09/16.  [name] continues to have a strong support system with sever sisters, a mother and a daughter who live locally.  He has diabetes and poor memory issues in addition to his heart condition.  He reports sleeping poorly due to nausea in the evenings particularly.  He has Ambien to take PRN for this but reports he takes it rarely.  He has a good appetite.  [name] states he is in a good mood generally speaking; even though he has multiple stressors with his health and limited finances - as he is on disability and has a lot of medical bills.  [name] admits to a history of mild depression and some current anxiety with racing thoughts which impact his sleep at night.  He is no longer on the heart transplant list.  [name] has goals to increase his stamina and strength while in this program.  Counselor suggested he speak with his Dr. about his sleep problems (nausea) and his racing thoughts/anxiety symptoms and he states he has an appointment next week and will address this.  Staff will be following with [name] throughout the course of this program.     Expected Outcomes [name] will benefit from consistent exercise to achieve his stated goals.   The educational and psychoeducational components of this program will be helpful in [name] understanding and coping more positively with his medical condition.     Continue Psychosocial Services  Follow up required by staff      Psychosocial Re-Evaluation:   Psychosocial Discharge (Final Psychosocial Re-Evaluation):   Vocational Rehabilitation: Provide vocational rehab assistance to qualifying candidates.   Vocational Rehab Evaluation & Intervention:   Education: Education Goals: Education classes will be provided on a variety of topics geared toward better understanding of heart health and risk factor modification. Participant will state understanding/return demonstration of topics  presented as noted by education test scores.  Learning Barriers/Preferences:     Learning Barriers/Preferences - 06/29/17 0944      Learning Barriers/Preferences   Learning Barriers None   Learning Preferences None      Education Topics: General Nutrition Guidelines/Fats and Fiber: -Group instruction provided by verbal, written material, models and posters to present the general guidelines for heart healthy nutrition. Gives an explanation and review of dietary fats and fiber.   Controlling Sodium/Reading Food Labels: -Group verbal and written material supporting the discussion of sodium use in heart healthy nutrition. Review and explanation with models, verbal and written materials for utilization of the food label.   Exercise Physiology & Risk Factors: - Group verbal and written instruction with models to review the exercise physiology of the cardiovascular system and associated critical values. Details cardiovascular disease risk factors and the goals associated with each risk factor.   Aerobic Exercise & Resistance Training: - Gives group verbal and written discussion on the health impact of inactivity. On the components of aerobic and resistive training programs and the benefits of this training and how to safely progress through these programs.   Flexibility, Balance, General Exercise Guidelines: - Provides group verbal and written instruction on the benefits of flexibility and balance training programs. Provides general exercise guidelines with specific guidelines to those with heart or lung disease. Demonstration and skill practice provided.   Stress Management: - Provides group verbal and written instruction about the health risks of elevated stress, cause of high stress, and healthy ways to reduce stress.   Depression: - Provides group verbal and written instruction on the correlation between heart/lung disease and depressed mood, treatment options, and the stigmas  associated with seeking treatment.   Anatomy & Physiology of the Heart: - Group verbal and written instruction and models provide basic cardiac anatomy and physiology, with the coronary electrical and arterial systems. Review of: AMI, Angina, Valve disease, Heart Failure, Cardiac Arrhythmia, Pacemakers, and the ICD.   Cardiac Procedures: - Group verbal and written instruction to review commonly prescribed medications for heart disease. Reviews the medication, class of the drug, and side effects. Includes the steps to properly store meds and maintain the prescription regimen. (beta blockers and nitrates)   Cardiac Medications I: - Group verbal and written instruction to review commonly prescribed medications for heart disease. Reviews the medication, class of the drug, and side effects. Includes the steps to properly store meds and maintain the prescription regimen.   Cardiac Rehab from 11/23/2016 in [facility] Cardiac and Pulmonary Rehab  Date  11/23/16  Educator  SB  Instruction Review Code (retired)  2- meets goals/outcomes      Cardiac Medications II: -Group verbal and written instruction to review  commonly prescribed medications for heart disease. Reviews the medication, class of the drug, and side effects. (all other drug classes)    Go Sex-Intimacy & Heart Disease, Get SMART - Goal Setting: - Group verbal and written instruction through game format to discuss heart disease and the return to sexual intimacy. Provides group verbal and written material to discuss and apply goal setting through the application of the S.M.A.R.T. Method.   Other Matters of the Heart: - Provides group verbal, written materials and models to describe Heart Failure, Angina, Valve Disease, Peripheral Artery Disease, and Diabetes in the realm of heart disease. Includes description of the disease process and treatment options available to the cardiac patient.   Exercise & Equipment Safety: - Individual verbal  instruction and demonstration of equipment use and safety with use of the equipment.   Cardiac Rehab from 11/23/2016 in [facility] Cardiac and Pulmonary Rehab  Date  11/23/16  Educator  CE  Instruction Review Code (retired)  2- meets goals/outcomes      Infection Prevention: - Provides verbal and written material to individual with discussion of infection control including proper hand washing and proper equipment cleaning during exercise session.   Cardiac Rehab from 11/23/2016 in [facility] Cardiac and Pulmonary Rehab  Date  11/23/16  Educator  CE  Instruction Review Code (retired)  2- meets goals/outcomes      Falls Prevention: - Provides verbal and written material to individual with discussion of falls prevention and safety.   Cardiac Rehab from 11/23/2016 in [facility] Cardiac and Pulmonary Rehab  Date  11/23/16  Educator  CE  Instruction Review Code (retired)  2- meets goals/outcomes      Diabetes: - Individual verbal and written instruction to review signs/symptoms of diabetes, desired ranges of glucose level fasting, after meals and with exercise. Acknowledge that pre and post exercise glucose checks will be done for 3 sessions at entry of program.   Cardiac Rehab from 11/23/2016 in [facility]  Date  11/23/16  Educator  CE  Instruction Review Code (retired)  2- meets goals/outcomes      Other: -Provides group and verbal instruction on various topics (see comments)    Knowledge Questionnaire Score:     Knowledge Questionnaire Score - 06/29/17 0944      Knowledge Questionnaire Score   Pre Score 21/28      Core Components/Risk Factors/Patient Goals at Admission:     Personal Goals and Risk Factors at Admission - 06/29/17 0942      Core Components/Risk Factors/Patient Goals on Admission    Weight Management Yes;Obesity;Weight Loss   Intervention Weight Management: Develop a combined nutrition and exercise program designed to reach desired caloric intake,  while maintaining appropriate intake of nutrient and fiber, sodium and fats, and appropriate energy expenditure required for the weight goal.;Weight Management: Provide education and appropriate resources to help participant work on and attain dietary goals.;Weight Management/Obesity: Establish reasonable short term and long term weight goals.;Obesity: Provide education and appropriate resources to help participant work on and attain dietary goals.   Admit Weight 231 lb (104.8 kg)   Goal Weight: Short Term 225 lb (102.1 kg)   Goal Weight: Long Term 221 lb (100.2 kg)   Expected Outcomes Short Term: Continue to assess and modify interventions until short term weight is achieved;Long Term: Adherence to nutrition and physical activity/exercise program aimed toward attainment of established weight goal;Weight Loss: Understanding of general recommendations for a balanced deficit meal plan, which promotes 1-2 lb weight loss  per week and includes a negative energy balance of [phone] kcal/d;Understanding recommendations for meals to include 15-35% energy as protein, 25-35% energy from fat, 35-60% energy from carbohydrates, less than '200mg'$  of dietary cholesterol, 20-35 gm of total fiber daily;Understanding of distribution of calorie intake throughout the day with the consumption of 4-5 meals/snacks   Improve shortness of breath with ADL's Yes   Intervention Provide education, individualized exercise plan and daily activity instruction to help decrease symptoms of SOB with activities of daily living.   Expected Outcomes Short Term: Achieves a reduction of symptoms when performing activities of daily living.   Diabetes Yes   Intervention Provide education about signs/symptoms and action to take for hypo/hyperglycemia.;Provide education about proper nutrition, including hydration, and aerobic/resistive exercise prescription along with prescribed medications to achieve blood glucose in normal ranges: Fasting glucose  65-99 mg/dL   Expected Outcomes Short Term: Participant verbalizes understanding of the signs/symptoms and immediate care of hyper/hypoglycemia, proper foot care and importance of medication, aerobic/resistive exercise and nutrition plan for blood glucose control.;Long Term: Attainment of HbA1C < 7%.   Heart Failure Yes   Intervention Provide a combined exercise and nutrition program that is supplemented with education, support and counseling about heart failure. Directed toward relieving symptoms such as shortness of breath, decreased exercise tolerance, and extremity edema.   Expected Outcomes Improve functional capacity of life;Short term: Attendance in program 2-3 days a week with increased exercise capacity. Reported lower sodium intake. Reported increased fruit and vegetable intake. Reports medication compliance.;Short term: Daily weights obtained and reported for increase. Utilizing diuretic protocols set by physician.;Long term: Adoption of self-care skills and reduction of barriers for early signs and symptoms recognition and intervention leading to self-care maintenance.   Hypertension Yes   Intervention Provide education on lifestyle modifcations including regular physical activity/exercise, weight management, moderate sodium restriction and increased consumption of fresh fruit, vegetables, and low fat dairy, alcohol moderation, and smoking cessation.;Monitor prescription use compliance.   Expected Outcomes Short Term: Continued assessment and intervention until BP is < 140/2m HG in hypertensive participants. < 130/879mHG in hypertensive participants with diabetes, heart failure or chronic kidney disease.;Long Term: Maintenance of blood pressure at goal levels.   Lipids Yes   Intervention Provide education and support for participant on nutrition & aerobic/resistive exercise along with prescribed medications to achieve LDL '70mg'$ , HDL >'40mg'$ .   Expected Outcomes Short Term: Participant states  understanding of desired cholesterol values and is compliant with medications prescribed. Participant is following exercise prescription and nutrition guidelines.;Long Term: Cholesterol controlled with medications as prescribed, with individualized exercise RX and with personalized nutrition plan. Value goals: LDL < '70mg'$ , HDL > 40 mg.   Stress Yes   Intervention Offer individual and/or small group education and counseling on adjustment to heart disease, stress management and health-related lifestyle change. Teach and support self-help strategies.;Refer participants experiencing significant psychosocial distress to appropriate mental health specialists for further evaluation and treatment. When possible, include family members and significant others in education/counseling sessions.   Expected Outcomes Short Term: Participant demonstrates changes in health-related behavior, relaxation and other stress management skills, ability to obtain effective social support, and compliance with psychotropic medications if prescribed.;Long Term: Emotional wellbeing is indicated by absence of clinically significant psychosocial distress or social isolation.      Core Components/Risk Factors/Patient Goals Review:    Core Components/Risk Factors/Patient Goals at Discharge (Final Review):    ITP Comments:     ITP Comments    Row Name 06/29/17 [id number]/18  0626         ITP Comments Medical evaluation completed/updated today for return to program.  ITP created and signed by Dr. Emily Filbert, Medical Director.  Documentation for diagnosis can be found in [facility] encounter on 06/20/17. 30 day Review. Continue with ITP unless directed changes per Medical Director Review.          Comments:

## 2017-07-13 NOTE — Progress Notes (Signed)
Cardiac Individual Treatment Plan  Patient Details  Name: Ronald Miller. MRN: [id number] Date of Birth: Jun 04, 1956 Referring Provider:     Cardiac Rehab from 06/29/2017 in [facility] Cardiac and Pulmonary Rehab  Referring Provider  Arvilla Meres MD      Initial Encounter Date:    Cardiac Rehab from 06/29/2017 in [facility] Cardiac and Pulmonary Rehab  Date  06/29/17  Referring Provider  Arvilla Meres MD      Visit Diagnosis: No diagnosis found.  Patient's Home Medications on Admission:  Current Outpatient Prescriptions:  .  acetaminophen (TYLENOL) 500 MG tablet, Take 1,000 mg by mouth every 6 (six) hours as needed for moderate pain or headache., Disp: , Rfl:  .  amiodarone (PACERONE) 200 MG tablet, Take 1 tablet (200 mg total) by mouth daily., Disp: 30 tablet, Rfl: 6 .  blood glucose meter kit and supplies KIT, Dispense based on patient and insurance preference. Use up to four times daily as directed. (FOR ICD-9 250.00, 250.01). Please include strips and lancets., Disp: 1 each, Rfl: 0 .  citalopram (CELEXA) 20 MG tablet, Take 1 tablet (20 mg total) by mouth daily., Disp: 30 tablet, Rfl: 6 .  digoxin (DIGOX) 0.125 MG tablet, Take 1 tablet (0.125 mg total) by mouth daily., Disp: 30 tablet, Rfl: 6 .  DOBUTamine (DOBUTREX) 4-5 MG/ML-% infusion, Inject 571.8 mcg/min into the vein continuous. PER [facility] pharmacy, Disp: , Rfl:  .  docusate sodium (COLACE) 100 MG capsule, Take 200 mg by mouth at bedtime. , Disp: , Rfl:  .  gabapentin (NEURONTIN) 100 MG capsule, Take 2 capsules (200 mg total) by mouth 2 (two) times daily., Disp: 120 capsule, Rfl: 3 .  insulin aspart (NOVOLOG FLEXPEN) 100 UNIT/ML FlexPen, Inject 5 Units into the skin 3 (three) times daily with meals., Disp: 15 mL, Rfl: 11 .  Insulin Glargine (LANTUS SOLOSTAR) 100 UNIT/ML Solostar Pen, Inject 20 Units into the skin daily at 10 pm. (Patient taking differently: Inject 10 Units into the skin daily at 10 pm. ), Disp: 15 mL, Rfl: 11 .   magnesium oxide (MAG-OX) 400 (241.3 Mg) MG tablet, Take 1 tablet (400 mg total) by mouth daily., Disp: 30 tablet, Rfl: 6 .  midodrine (PROAMATINE) 2.5 MG tablet, Take 1 tablet (2.5 mg total) by mouth 3 (three) times daily with meals., Disp: 90 tablet, Rfl: 6 .  Multiple Vitamin (MULTIVITAMIN WITH MINERALS) TABS tablet, Take 1 tablet by mouth daily., Disp: , Rfl:  .  mupirocin ointment (BACTROBAN) 2 %, Apply 1 application topically to leg blisters 2 times daily or as needed, Disp: 22 g, Rfl: 6 .  ondansetron (ZOFRAN ODT) 4 MG disintegrating tablet, Take 1 tablet (4 mg total) by mouth every 8 (eight) hours as needed for nausea., Disp: 6 tablet, Rfl: 0 .  pantoprazole (PROTONIX) 40 MG tablet, Take 1 tablet (40 mg total) by mouth 2 (two) times daily before a meal., Disp: 60 tablet, Rfl: 6 .  potassium chloride SA (K-DUR,KLOR-CON) 20 MEQ tablet, Take 1 tablet (20 mEq total) by mouth 2 (two) times daily., Disp: 60 tablet, Rfl: 3 .  senna-docusate (SENOKOT S) 8.6-50 MG tablet, Take 2 tablets by mouth at bedtime as needed for mild constipation., Disp: 30 tablet, Rfl: 6 .  sildenafil (REVATIO) 20 MG tablet, Take 2 tablets (40 mg total) by mouth 3 (three) times daily. (Patient taking differently: Take 40 mg by mouth 2 (two) times daily. ), Disp: 120 tablet, Rfl: 6 .  spironolactone (ALDACTONE) 25 MG tablet,  Take 0.5 tablets (12.5 mg total) by mouth daily., Disp: 15 tablet, Rfl: 6 .  sucralfate (CARAFATE) 1 GM/10ML suspension, Take 10 mLs (1 g total) by mouth 4 (four) times daily -  with meals and at bedtime., Disp: 420 mL, Rfl: 6 .  torsemide (DEMADEX) 100 MG tablet, Take 0.5 tablets (50 mg total) by mouth 2 (two) times daily., Disp: 30 tablet, Rfl: 3 .  traMADol (ULTRAM) 50 MG tablet, Take 1 tablet (50 mg total) by mouth 3 (three) times daily as needed for severe pain., Disp: 30 tablet, Rfl: 0 .  warfarin (COUMADIN) 5 MG tablet, Take 10 mg (2 tablets) daily except 7.5 mg (1 and 1/2 tablets) on Mon/Wed/Fri.,  Disp: , Rfl:  .  zolpidem (AMBIEN) 10 MG tablet, Take 1 tablet (10 mg total) by mouth at bedtime as needed for sleep. (Patient taking differently: Take 10 mg by mouth at bedtime. ), Disp: 30 tablet, Rfl: 2  Past Medical History: Past Medical History:  Diagnosis Date  . AICD (automatic cardioverter/defibrillator) present   . ASCVD (arteriosclerotic cardiovascular disease)   . Chronic systolic CHF (congestive heart failure) ([place])   . COPD (chronic obstructive pulmonary disease) ([place])   . Coronary artery disease   . Depression   . Diabetes mellitus   . History of cocaine abuse   . Hypertension   . Presence of permanent cardiac pacemaker   . Shortness of breath dyspnea   . Suicidal ideation   . Tobacco abuse     Tobacco Use: History  Smoking Status  . Former Smoker  . Packs/day: 1.50  . Years: 23.00  . Types: Cigarettes  . Start date: 10/24/1992  . Quit date: 07/01/2016  Smokeless Tobacco  . Never Used    Labs: Recent Review Flowsheet Data    Labs for ITP Cardiac and Pulmonary Rehab Latest Ref Rng & Units 06/11/2017 06/12/2017 06/13/2017 06/14/2017 06/15/2017   Cholestrol 0 - 200 mg/dL - - - - -   LDLCALC 0 - 100 mg/dL - - - - -   HDL 40 - 60 mg/dL - - - - -   Trlycerides 0 - 200 mg/dL - - - - -   Hemoglobin A1c 4.8 - 5.6 % - - - - -   PHART 7.350 - 7.450 - - - - -   PCO2ART 32.0 - 48.0 mmHg - - - - -   HCO3 20.0 - 28.0 mmol/L - - - - -   TCO2 0 - 100 mmol/L - - - - -   ACIDBASEDEF 0.0 - 2.0 mmol/L - - - - -   O2SAT % 63.3 55.8 68.4 64.3 54.9       Exercise Target Goals:    Exercise Program Goal: Individual exercise prescription set with THRR, safety & activity barriers. Participant demonstrates ability to understand and report RPE using BORG scale, to self-measure pulse accurately, and to acknowledge the importance of the exercise prescription.  Exercise Prescription Goal: Starting with aerobic activity 30 plus minutes a day, 3 days per week for initial exercise  prescription. Provide home exercise prescription and guidelines that participant acknowledges understanding prior to discharge.  Activity Barriers & Risk Stratification:     Activity Barriers & Cardiac Risk Stratification - 06/29/17 0936      Activity Barriers & Cardiac Risk Stratification   Activity Barriers History of Falls;Balance Concerns;Assistive Device;Deconditioning;Muscular Weakness;Shortness of Breath;Decreased Ventricular Function;Back Problems   Cardiac Risk Stratification High      6 Minute Walk:  6 Minute Walk    Row Name 06/29/17 0930         6 Minute Walk   Phase Initial     Distance 635 feet     Walk Time 4.48 minutes     # of Rest Breaks 1  1:31     MPH 1.61     METS 1.44     RPE 13     Perceived Dyspnea  3     VO2 Peak 5.04     Symptoms Yes (comment)     Comments SOB, chest soreness from walking (Chronic)     Resting HR 59 bpm     Resting BP -  84 dopplar     Resting Oxygen Saturation  94 %     Exercise Oxygen Saturation  during 6 min walk 93 %     Max Ex. HR 61 bpm     Max Ex. BP -  90 dopplar       Interval Oxygen   Interval Oxygen? Yes     Baseline Oxygen Saturation % 94 %     1 Minute Oxygen Saturation % 95 %     1 Minute Liters of Oxygen 0 L  room air     2 Minute Oxygen Saturation % 93 %     2 Minute Liters of Oxygen 0 L     3 Minute Oxygen Saturation % 96 %     3 Minute Liters of Oxygen 0 L     4 Minute Oxygen Saturation % 96 %     4 Minute Liters of Oxygen 0 L     5 Minute Oxygen Saturation % 94 %     5 Minute Liters of Oxygen 0 L     6 Minute Oxygen Saturation % 94 %     6 Minute Liters of Oxygen 0 L     2 Minute Post Oxygen Saturation % 97 %     2 Minute Post Liters of Oxygen 0 L        Oxygen Initial Assessment:   Oxygen Re-Evaluation:   Oxygen Discharge (Final Oxygen Re-Evaluation):   Initial Exercise Prescription:     Initial Exercise Prescription - 06/29/17 0900      Date of Initial Exercise RX and  Referring Provider   Date 06/29/17   Referring Provider Glori Bickers MD     Treadmill   MPH 1   Grade 0   Minutes 15   METs 1.77     NuStep   Level 1   SPM 80   Minutes 15   METs 1.4     Biostep-RELP   Level 1   SPM 50   Minutes 15   METs 2     Prescription Details   Frequency (times per week) 3   Duration Progress to 45 minutes of aerobic exercise without signs/symptoms of physical distress     Intensity   THRR 40-80% of Max Heartrate 99-140   Ratings of Perceived Exertion 11-13   Perceived Dyspnea 0-4     Progression   Progression Continue to progress workloads to maintain intensity without signs/symptoms of physical distress.     Resistance Training   Training Prescription Yes   Weight 2 lbs   Reps 10-15      Perform Capillary Blood Glucose checks as needed.  Exercise Prescription Changes:     Exercise Prescription Changes    Row Name 06/29/17 0900  Response to Exercise   Blood Pressure (Admit) -  84 dopplar       Blood Pressure (Exercise) -  90 dopplar       Blood Pressure (Exit) -  68 dopplar       Heart Rate (Admit) 59 bpm       Heart Rate (Exercise) 61 bpm       Oxygen Saturation (Admit) 94 %       Oxygen Saturation (Exercise) 93 %       Oxygen Saturation (Exit) 97 %       Rating of Perceived Exertion (Exercise) 13       Perceived Dyspnea (Exercise) 3       Symptoms SOB, chest soreness       Comments walk test results and first full day of exercise          Exercise Comments:     Exercise Comments    Row Name 06/29/17 0751           Exercise Comments First full day of exercise!  Patient was oriented to gym and equipment including functions, settings, policies, and procedures.  Patient's individual exercise prescription and treatment plan were reviewed.  All starting workloads were established based on the results of the 6 minute walk test done at initial orientation visit.  The plan for exercise progression was  also introduced and progression will be customized based on patient's performance and goals.          Exercise Goals and Review:     Exercise Goals    Row Name 06/29/17 0751             Exercise Goals   Increase Physical Activity Yes       Intervention Provide advice, education, support and counseling about physical activity/exercise needs.;Develop an individualized exercise prescription for aerobic and resistive training based on initial evaluation findings, risk stratification, comorbidities and participant's personal goals.       Expected Outcomes Achievement of increased cardiorespiratory fitness and enhanced flexibility, muscular endurance and strength shown through measurements of functional capacity and personal statement of participant.       Increase Strength and Stamina Yes       Intervention Provide advice, education, support and counseling about physical activity/exercise needs.;Develop an individualized exercise prescription for aerobic and resistive training based on initial evaluation findings, risk stratification, comorbidities and participant's personal goals.       Expected Outcomes Achievement of increased cardiorespiratory fitness and enhanced flexibility, muscular endurance and strength shown through measurements of functional capacity and personal statement of participant.       Able to understand and use rate of perceived exertion (RPE) scale Yes       Intervention Provide education and explanation on how to use RPE scale       Expected Outcomes Short Term: Able to use RPE daily in rehab to express subjective intensity level;Long Term:  Able to use RPE to guide intensity level when exercising independently       Able to understand and use Dyspnea scale Yes       Intervention Provide education and explanation on how to use Dyspnea scale       Expected Outcomes Short Term: Able to use Dyspnea scale daily in rehab to express subjective sense of shortness of breath during  exertion;Long Term: Able to use Dyspnea scale to guide intensity level when exercising independently       Knowledge and understanding of Target Heart Rate Range (  THRR) Yes       Intervention Provide education and explanation of THRR including how the numbers were predicted and where they are located for reference       Expected Outcomes Short Term: Able to state/look up THRR;Long Term: Able to use THRR to govern intensity when exercising independently;Short Term: Able to use daily as guideline for intensity in rehab       Able to check pulse independently -  patient has an LVAD       Understanding of Exercise Prescription Yes       Intervention Provide education, explanation, and written materials on patient's individual exercise prescription       Expected Outcomes Short Term: Able to explain program exercise prescription;Long Term: Able to explain home exercise prescription to exercise independently          Exercise Goals Re-Evaluation :   Discharge Exercise Prescription (Final Exercise Prescription Changes):     Exercise Prescription Changes - 06/29/17 0900      Response to Exercise   Blood Pressure (Admit) --  84 dopplar   Blood Pressure (Exercise) --  90 dopplar   Blood Pressure (Exit) --  68 dopplar   Heart Rate (Admit) 59 bpm   Heart Rate (Exercise) 61 bpm   Oxygen Saturation (Admit) 94 %   Oxygen Saturation (Exercise) 93 %   Oxygen Saturation (Exit) 97 %   Rating of Perceived Exertion (Exercise) 13   Perceived Dyspnea (Exercise) 3   Symptoms SOB, chest soreness   Comments walk test results and first full day of exercise      Nutrition:  Target Goals: Understanding of nutrition guidelines, daily intake of sodium <1549m, cholesterol <2073m calories 30% from fat and 7% or less from saturated fats, daily to have 5 or more servings of fruits and vegetables.  Biometrics:     Pre Biometrics - 06/29/17 0941      Pre Biometrics   Height 5' 9.75" (1.772 m)   Weight  231 lb (104.8 kg)   Waist Circumference 45 inches   Hip Circumference 43.5 inches   Waist to Hip Ratio 1.03 %   BMI (Calculated) 33.37   Single Leg Stand 12.89 seconds       Nutrition Therapy Plan and Nutrition Goals:     Nutrition Therapy & Goals - 06/29/17 0945      Intervention Plan   Intervention Prescribe, educate and counsel regarding individualized specific dietary modifications aiming towards targeted core components such as weight, hypertension, lipid management, diabetes, heart failure and other comorbidities.   Expected Outcomes Short Term Goal: Understand basic principles of dietary content, such as calories, fat, sodium, cholesterol and nutrients.;Long Term Goal: Adherence to prescribed nutrition plan.;Short Term Goal: A plan has been developed with personal nutrition goals set during dietitian appointment.      Nutrition Discharge: Rate Your Plate Scores:     Nutrition Assessments - 06/29/17 0946      MEDFICTS Scores   Pre Score 6      Nutrition Goals Re-Evaluation:   Nutrition Goals Discharge (Final Nutrition Goals Re-Evaluation):   Psychosocial: Target Goals: Acknowledge presence or absence of significant depression and/or stress, maximize coping skills, provide positive support system. Participant is able to verbalize types and ability to use techniques and skills needed for reducing stress and depression.   Initial Review & Psychosocial Screening:     Initial Psych Review & Screening - 06/29/17 0946      Initial Review   Current issues with  Current Depression;History of Depression;Current Anxiety/Panic;Current Psychotropic Meds;Current Stress Concerns   Source of Stress Concerns Chronic Illness;Poor Coping Skills;Family;Unable to participate in former interests or hobbies;Unable to perform yard/household activities;Financial;Transportation;Retirement/disability   Comments [name] has a lot going on.  Has limited support system that have been in and out of  life.      Family Dynamics   Good Support System? Yes     Barriers   Psychosocial barriers to participate in program The patient should benefit from training in stress management and relaxation.;Psychosocial barriers identified (see note)     Screening Interventions   Interventions Yes;Encouraged to exercise;Program counselor consult;Provide feedback about the scores to participant;To provide support and resources with identified psychosocial needs   Expected Outcomes Short Term goal: Utilizing psychosocial counselor, staff and physician to assist with identification of specific Stressors or current issues interfering with healing process. Setting desired goal for each stressor or current issue identified.;Long Term Goal: Stressors or current issues are controlled or eliminated.;Short Term goal: Identification and review with participant of any Quality of Life or Depression concerns found by scoring the questionnaire.;Long Term goal: The participant improves quality of Life and PHQ9 Scores as seen by post scores and/or verbalization of changes      Quality of Life Scores:      Quality of Life - 06/29/17 0948      Quality of Life Scores   Health/Function Pre 18.68 %   Socioeconomic Pre 20.14 %   Psych/Spiritual Pre 24 %   Family Pre 22.25 %   GLOBAL Pre 20.61 %      PHQ-9: Recent Review Flowsheet Data    Depression screen [facility] 2/9 06/29/2017 11/23/2016   Decreased Interest 0 1   Down, Depressed, Hopeless 0 1   PHQ - 2 Score 0 2   Altered sleeping 2 2   Tired, decreased energy 2 1   Change in appetite 0 0   Feeling bad or failure about yourself  0 2   Trouble concentrating 0 2   Moving slowly or fidgety/restless 0 1   Suicidal thoughts 0 0   PHQ-9 Score 4 10   Difficult doing work/chores Somewhat difficult Not difficult at all     Interpretation of Total Score  Total Score Depression Severity:  1-4 = Minimal depression, 5-9 = Mild depression, 10-14 = Moderate depression,  15-19 = Moderately severe depression, 20-27 = Severe depression   Psychosocial Evaluation and Intervention:     Psychosocial Evaluation - 06/29/17 0932      Psychosocial Evaluation & Interventions   Comments Counselor met with Mr. Keil [facility]) today for initial psychosocial evaluation -as he has returned to this program for the 2nd time this year.  He is a 61 year old who had a LVAD procedure on 09/09/16.  [name] continues to have a strong support system with sever sisters, a mother and a daughter who live locally.  He has diabetes and poor memory issues in addition to his heart condition.  He reports sleeping poorly due to nausea in the evenings particularly.  He has Ambien to take PRN for this but reports he takes it rarely.  He has a good appetite.  [name] states he is in a good mood generally speaking; even though he has multiple stressors with his health and limited finances - as he is on disability and has a lot of medical bills.  [name] admits to a history of mild depression and some current anxiety with racing thoughts which impact his sleep  at night.  He is no longer on the heart transplant list.  [name] has goals to increase his stamina and strength while in this program.  Counselor suggested he speak with his Dr. about his sleep problems (nausea) and his racing thoughts/anxiety symptoms and he states he has an appointment next week and will address this.  Staff will be following with [name] throughout the course of this program.     Expected Outcomes [name] will benefit from consistent exercise to achieve his stated goals.   The educational and psychoeducational components of this program will be helpful in [name] understanding and coping more positively with his medical condition.     Continue Psychosocial Services  Follow up required by staff      Psychosocial Re-Evaluation:   Psychosocial Discharge (Final Psychosocial Re-Evaluation):   Vocational Rehabilitation: Provide vocational rehab assistance to  qualifying candidates.   Vocational Rehab Evaluation & Intervention:   Education: Education Goals: Education classes will be provided on a variety of topics geared toward better understanding of heart health and risk factor modification. Participant will state understanding/return demonstration of topics presented as noted by education test scores.  Learning Barriers/Preferences:     Learning Barriers/Preferences - 06/29/17 0944      Learning Barriers/Preferences   Learning Barriers None   Learning Preferences None      Education Topics: General Nutrition Guidelines/Fats and Fiber: -Group instruction provided by verbal, written material, models and posters to present the general guidelines for heart healthy nutrition. Gives an explanation and review of dietary fats and fiber.   Controlling Sodium/Reading Food Labels: -Group verbal and written material supporting the discussion of sodium use in heart healthy nutrition. Review and explanation with models, verbal and written materials for utilization of the food label.   Exercise Physiology & Risk Factors: - Group verbal and written instruction with models to review the exercise physiology of the cardiovascular system and associated critical values. Details cardiovascular disease risk factors and the goals associated with each risk factor.   Aerobic Exercise & Resistance Training: - Gives group verbal and written discussion on the health impact of inactivity. On the components of aerobic and resistive training programs and the benefits of this training and how to safely progress through these programs.   Flexibility, Balance, General Exercise Guidelines: - Provides group verbal and written instruction on the benefits of flexibility and balance training programs. Provides general exercise guidelines with specific guidelines to those with heart or lung disease. Demonstration and skill practice provided.   Stress Management: -  Provides group verbal and written instruction about the health risks of elevated stress, cause of high stress, and healthy ways to reduce stress.   Depression: - Provides group verbal and written instruction on the correlation between heart/lung disease and depressed mood, treatment options, and the stigmas associated with seeking treatment.   Anatomy & Physiology of the Heart: - Group verbal and written instruction and models provide basic cardiac anatomy and physiology, with the coronary electrical and arterial systems. Review of: AMI, Angina, Valve disease, Heart Failure, Cardiac Arrhythmia, Pacemakers, and the ICD.   Cardiac Procedures: - Group verbal and written instruction to review commonly prescribed medications for heart disease. Reviews the medication, class of the drug, and side effects. Includes the steps to properly store meds and maintain the prescription regimen. (beta blockers and nitrates)   Cardiac Medications I: - Group verbal and written instruction to review commonly prescribed medications for heart disease. Reviews the medication, class of the drug, and  side effects. Includes the steps to properly store meds and maintain the prescription regimen.   Cardiac Rehab from 11/23/2016 in [facility] Cardiac and Pulmonary Rehab  Date  11/23/16  Educator  SB  Instruction Review Code (retired)  2- meets goals/outcomes      Cardiac Medications II: -Group verbal and written instruction to review commonly prescribed medications for heart disease. Reviews the medication, class of the drug, and side effects. (all other drug classes)    Go Sex-Intimacy & Heart Disease, Get SMART - Goal Setting: - Group verbal and written instruction through game format to discuss heart disease and the return to sexual intimacy. Provides group verbal and written material to discuss and apply goal setting through the application of the S.M.A.R.T. Method.   Other Matters of the Heart: - Provides group  verbal, written materials and models to describe Heart Failure, Angina, Valve Disease, Peripheral Artery Disease, and Diabetes in the realm of heart disease. Includes description of the disease process and treatment options available to the cardiac patient.   Exercise & Equipment Safety: - Individual verbal instruction and demonstration of equipment use and safety with use of the equipment.   Cardiac Rehab from 11/23/2016 in [facility] Cardiac and Pulmonary Rehab  Date  11/23/16  Educator  CE  Instruction Review Code (retired)  2- meets goals/outcomes      Infection Prevention: - Provides verbal and written material to individual with discussion of infection control including proper hand washing and proper equipment cleaning during exercise session.   Cardiac Rehab from 11/23/2016 in [facility] Cardiac and Pulmonary Rehab  Date  11/23/16  Educator  CE  Instruction Review Code (retired)  2- meets goals/outcomes      Falls Prevention: - Provides verbal and written material to individual with discussion of falls prevention and safety.   Cardiac Rehab from 11/23/2016 in [facility] Cardiac and Pulmonary Rehab  Date  11/23/16  Educator  CE  Instruction Review Code (retired)  2- meets goals/outcomes      Diabetes: - Individual verbal and written instruction to review signs/symptoms of diabetes, desired ranges of glucose level fasting, after meals and with exercise. Acknowledge that pre and post exercise glucose checks will be done for 3 sessions at entry of program.   Cardiac Rehab from 11/23/2016 in [facility] Cardiac and Pulmonary Rehab  Date  11/23/16  Educator  CE  Instruction Review Code (retired)  2- meets goals/outcomes      Other: -Provides group and verbal instruction on various topics (see comments)    Knowledge Questionnaire Score:     Knowledge Questionnaire Score - 06/29/17 0944      Knowledge Questionnaire Score   Pre Score 21/28      Core Components/Risk Factors/Patient Goals at  Admission:     Personal Goals and Risk Factors at Admission - 06/29/17 0942      Core Components/Risk Factors/Patient Goals on Admission    Weight Management Yes;Obesity;Weight Loss   Intervention Weight Management: Develop a combined nutrition and exercise program designed to reach desired caloric intake, while maintaining appropriate intake of nutrient and fiber, sodium and fats, and appropriate energy expenditure required for the weight goal.;Weight Management: Provide education and appropriate resources to help participant work on and attain dietary goals.;Weight Management/Obesity: Establish reasonable short term and long term weight goals.;Obesity: Provide education and appropriate resources to help participant work on and attain dietary goals.   Admit Weight 231 lb (104.8 kg)   Goal Weight: Short Term 225 lb (102.1 kg)  Goal Weight: Long Term 221 lb (100.2 kg)   Expected Outcomes Short Term: Continue to assess and modify interventions until short term weight is achieved;Long Term: Adherence to nutrition and physical activity/exercise program aimed toward attainment of established weight goal;Weight Loss: Understanding of general recommendations for a balanced deficit meal plan, which promotes 1-2 lb weight loss per week and includes a negative energy balance of [phone] kcal/d;Understanding recommendations for meals to include 15-35% energy as protein, 25-35% energy from fat, 35-60% energy from carbohydrates, less than 234m of dietary cholesterol, 20-35 gm of total fiber daily;Understanding of distribution of calorie intake throughout the day with the consumption of 4-5 meals/snacks   Improve shortness of breath with ADL's Yes   Intervention Provide education, individualized exercise plan and daily activity instruction to help decrease symptoms of SOB with activities of daily living.   Expected Outcomes Short Term: Achieves a reduction of symptoms when performing activities of daily living.    Diabetes Yes   Intervention Provide education about signs/symptoms and action to take for hypo/hyperglycemia.;Provide education about proper nutrition, including hydration, and aerobic/resistive exercise prescription along with prescribed medications to achieve blood glucose in normal ranges: Fasting glucose 65-99 mg/dL   Expected Outcomes Short Term: Participant verbalizes understanding of the signs/symptoms and immediate care of hyper/hypoglycemia, proper foot care and importance of medication, aerobic/resistive exercise and nutrition plan for blood glucose control.;Long Term: Attainment of HbA1C < 7%.   Heart Failure Yes   Intervention Provide a combined exercise and nutrition program that is supplemented with education, support and counseling about heart failure. Directed toward relieving symptoms such as shortness of breath, decreased exercise tolerance, and extremity edema.   Expected Outcomes Improve functional capacity of life;Short term: Attendance in program 2-3 days a week with increased exercise capacity. Reported lower sodium intake. Reported increased fruit and vegetable intake. Reports medication compliance.;Short term: Daily weights obtained and reported for increase. Utilizing diuretic protocols set by physician.;Long term: Adoption of self-care skills and reduction of barriers for early signs and symptoms recognition and intervention leading to self-care maintenance.   Hypertension Yes   Intervention Provide education on lifestyle modifcations including regular physical activity/exercise, weight management, moderate sodium restriction and increased consumption of fresh fruit, vegetables, and low fat dairy, alcohol moderation, and smoking cessation.;Monitor prescription use compliance.   Expected Outcomes Short Term: Continued assessment and intervention until BP is < 140/965mHG in hypertensive participants. < 130/805mG in hypertensive participants with diabetes, heart failure or chronic  kidney disease.;Long Term: Maintenance of blood pressure at goal levels.   Lipids Yes   Intervention Provide education and support for participant on nutrition & aerobic/resistive exercise along with prescribed medications to achieve LDL <48m66mDL >40mg5mExpected Outcomes Short Term: Participant states understanding of desired cholesterol values and is compliant with medications prescribed. Participant is following exercise prescription and nutrition guidelines.;Long Term: Cholesterol controlled with medications as prescribed, with individualized exercise RX and with personalized nutrition plan. Value goals: LDL < 48mg,60m > 40 mg.   Stress Yes   Intervention Offer individual and/or small group education and counseling on adjustment to heart disease, stress management and health-related lifestyle change. Teach and support self-help strategies.;Refer participants experiencing significant psychosocial distress to appropriate mental health specialists for further evaluation and treatment. When possible, include family members and significant others in education/counseling sessions.   Expected Outcomes Short Term: Participant demonstrates changes in health-related behavior, relaxation and other stress management skills, ability to obtain effective social support, and compliance with psychotropic  medications if prescribed.;Long Term: Emotional wellbeing is indicated by absence of clinically significant psychosocial distress or social isolation.      Core Components/Risk Factors/Patient Goals Review:    Core Components/Risk Factors/Patient Goals at Discharge (Final Review):    ITP Comments:     ITP Comments    Row Name 06/29/17 0934 07/13/17 0626         ITP Comments Medical evaluation completed/updated today for return to program.  ITP created and signed by Dr. Emily Filbert, Medical Director.  Documentation for diagnosis can be found in [facility] encounter on 06/20/17. 30 day Review. Continue with ITP  unless directed changes per Medical Director Review.          Comments:

## 2017-07-13 NOTE — Progress Notes (Signed)
Patient presents for 1 week  follow up in [facility] today. Reports no problems with VAD equipment or concerns with drive line. States he feels better and has had no more diarrhea. Sleeping okay.  Vital Signs:  Doppler Pressure 104   Automatc BP: 109/66 (84) HR:92   SPO2:92  %  Weight: 228 lb w/o eqt Last weight: 230 lb Home weights: 220-225 lbs   VAD Indication: Destination Therapy- eval complete at [facility]  VAD interrogation & Equipment Management: Speed:5900 Flow: 5.3 Power:4.7 w    PI:2.7  Alarms: no clinical alarms Events: rare PI event  Fixed speed 5900 Low speed limit: 5600  Primary Controller:  Replace back up battery in 54months. Back up controller:   Replace back up battery in 29 months.  Annual Equipment Maintenance on [facility]/PM was performed on 09/2017.   I reviewed the LVAD parameters from today and compared the results to the patient's prior recorded data. LVAD interrogation was NEGATIVE for significant power changes, NEGATIVE for clinical alarms and STABLE for PI events/speed drops. No programming changes were made and pump is functioning within specified parameters. Pt is performing daily controller and system monitor self tests along with completing weekly and monthly maintenance for LVAD equipment.  LVAD equipment check completed and is in good working order. Back-up equipment present. Charged back up battery and performed self-test on equipment.   Exit Site Care: Drive line is being maintained weekly  by VAD coordinators. Drive line exit site well healed and incorporated. The velour is fully implanted at exit site. Dressing dry and intact. No erythema or drainage. Stabilization device present and accurately applied. Pt denies fever or chills. Pt states they have adequate dressing supplies at home.   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 Specifically denies melena/BRBPR or nosebleeds.  LDH stable at 182 with established baseline of 150- 225. Denies tea-colored urine. No power elevations noted on interrogation.   Plan: 1. Stop sildenafil (per pharmacy record, patient has not had filled for 4 months) 2. For two days- take 100mg  of Torsemide in the morning and 50 mg in the evening. Then resume 50mg  daily. 3. RTC in 1 week for doctor visit.  Balinda Quails RN [place] Coordinator   Office: [phone] 24/7 Emergency VAD Pager: [phone]

## 2017-07-14 IMAGING — CR DG ABDOMEN 1V
1 series · 1 of 1 positions shown · non-contrast
Comparison: None.

CLINICAL DATA: Lower abdomen pain for 2 weeks, nausea/vomiting for
3 days

EXAM:
ABDOMEN - 1 VIEW

[abdomen kub]
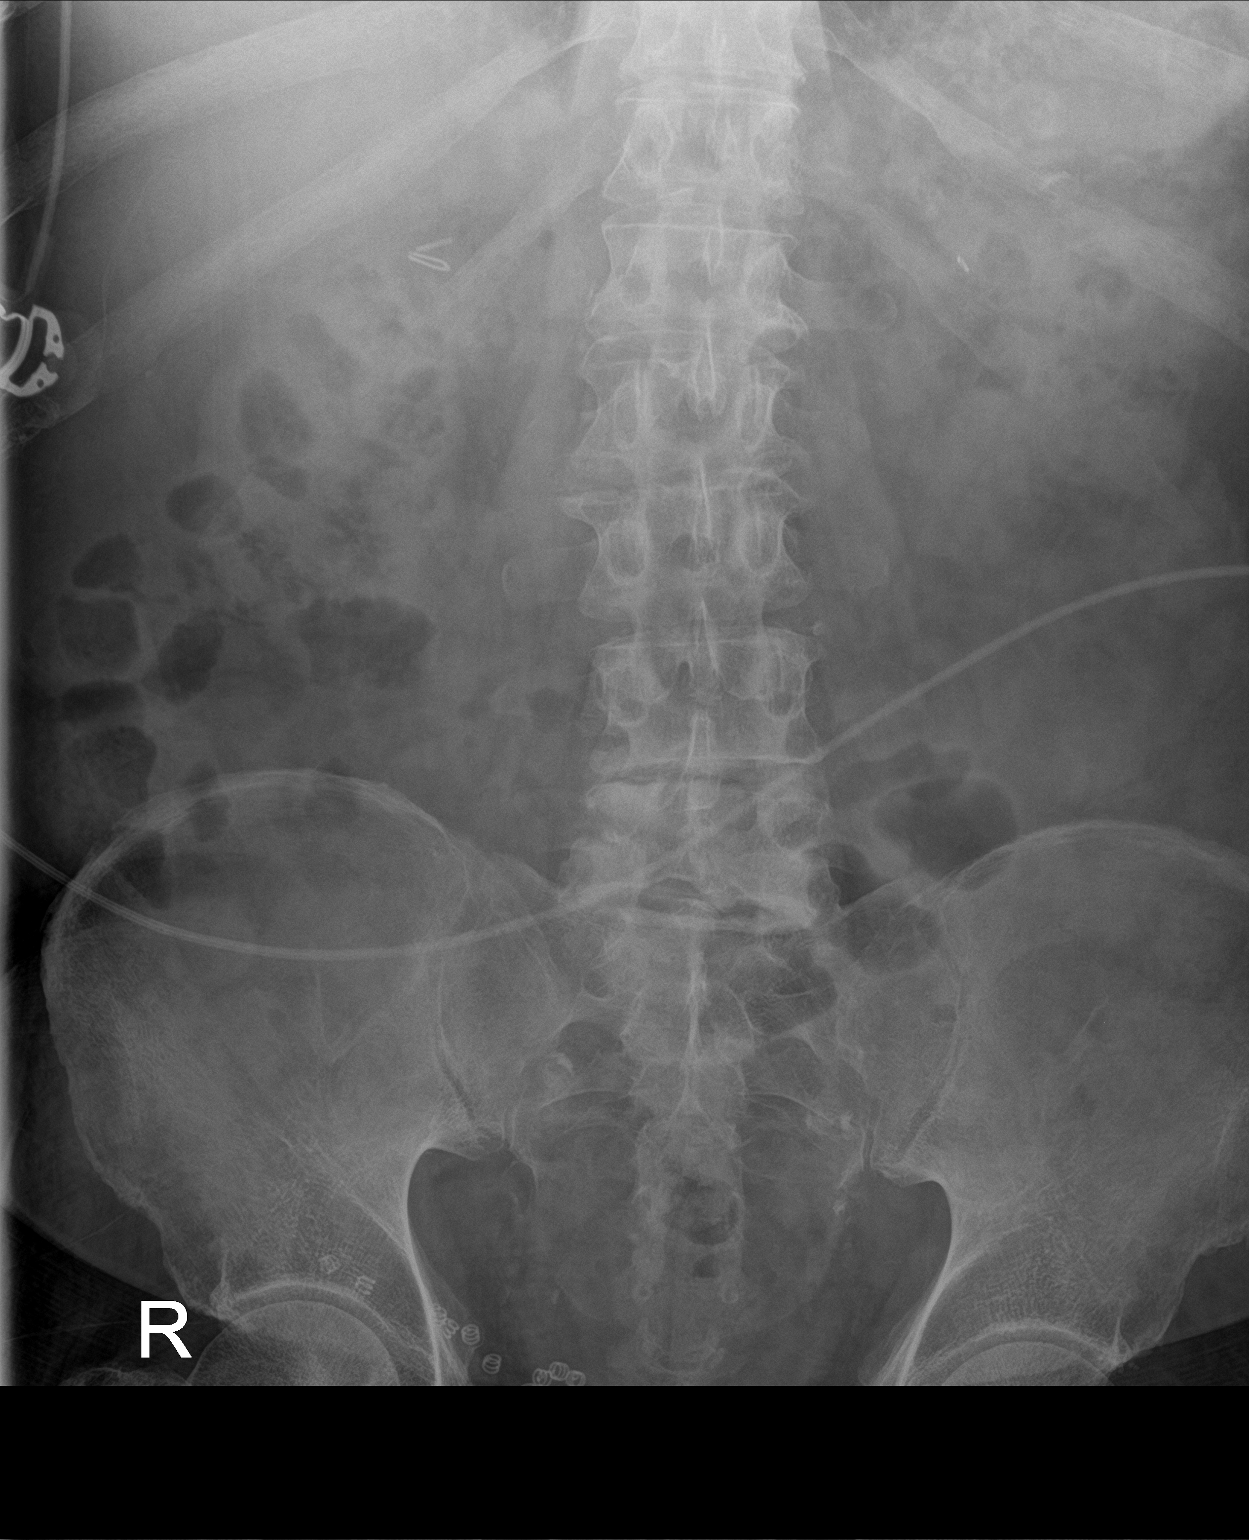

[1 of 1 positions shown; findings below may reference images not displayed]

FINDINGS: Nonobstructive bowel gas pattern. Cholecystectomy clips noted.
Hernia mesh seen over right low abdomen. No other abnormal
opacities.
IMPRESSION: No acute findings

## 2017-07-14 NOTE — Progress Notes (Signed)
Cardiac Individual Treatment Plan  Patient Details  Name: Ronald Miller. MRN: [id number] Date of Birth: [birth year] Referring Provider:     Cardiac Rehab from 06/29/2017 in [facility] Cardiac and Pulmonary Rehab  Referring Provider  Glori Bickers MD      Initial Encounter Date:    Cardiac Rehab from 06/29/2017 in [facility] Cardiac and Pulmonary Rehab  Date  06/29/17  Referring Provider  Glori Bickers MD      Visit Diagnosis: Heart failure, chronic systolic ([place])  LVAD (left ventricular assist device) present [facility])  Patient's Home Medications on Admission:  Current Outpatient Prescriptions:  .  acetaminophen (TYLENOL) 500 MG tablet, Take 1,000 mg by mouth every 6 (six) hours as needed for moderate pain or headache., Disp: , Rfl:  .  amiodarone (PACERONE) 200 MG tablet, Take 1 tablet (200 mg total) by mouth daily., Disp: 30 tablet, Rfl: 6 .  blood glucose meter kit and supplies KIT, Dispense based on patient and insurance preference. Use up to four times daily as directed. (FOR ICD-9 250.00, 250.01). Please include strips and lancets., Disp: 1 each, Rfl: 0 .  citalopram (CELEXA) 20 MG tablet, Take 1 tablet (20 mg total) by mouth daily., Disp: 30 tablet, Rfl: 6 .  digoxin (DIGOX) 0.125 MG tablet, Take 1 tablet (0.125 mg total) by mouth daily., Disp: 30 tablet, Rfl: 6 .  DOBUTamine (DOBUTREX) 4-5 MG/ML-% infusion, Inject 571.8 mcg/min into the vein continuous. PER [facility] pharmacy, Disp: , Rfl:  .  docusate sodium (COLACE) 100 MG capsule, Take 200 mg by mouth at bedtime. , Disp: , Rfl:  .  gabapentin (NEURONTIN) 100 MG capsule, Take 2 capsules (200 mg total) by mouth 2 (two) times daily., Disp: 120 capsule, Rfl: 3 .  insulin aspart (NOVOLOG FLEXPEN) 100 UNIT/ML FlexPen, Inject 5 Units into the skin 3 (three) times daily with meals., Disp: 15 mL, Rfl: 11 .  Insulin Glargine (LANTUS SOLOSTAR) 100 UNIT/ML Solostar Pen, Inject 20 Units into the skin daily at 10 pm., Disp: 15 mL, Rfl: 11 .   magnesium oxide (MAG-OX) 400 (241.3 Mg) MG tablet, Take 1 tablet (400 mg total) by mouth daily., Disp: 30 tablet, Rfl: 6 .  midodrine (PROAMATINE) 2.5 MG tablet, Take 1 tablet (2.5 mg total) by mouth 3 (three) times daily with meals., Disp: 90 tablet, Rfl: 6 .  Multiple Vitamin (MULTIVITAMIN WITH MINERALS) TABS tablet, Take 1 tablet by mouth daily., Disp: , Rfl:  .  mupirocin ointment (BACTROBAN) 2 %, Apply 1 application topically to leg blisters 2 times daily or as needed, Disp: 22 g, Rfl: 6 .  ondansetron (ZOFRAN ODT) 4 MG disintegrating tablet, Take 1 tablet (4 mg total) by mouth every 8 (eight) hours as needed for nausea., Disp: 6 tablet, Rfl: 0 .  pantoprazole (PROTONIX) 40 MG tablet, Take 1 tablet (40 mg total) by mouth 2 (two) times daily before a meal., Disp: 60 tablet, Rfl: 6 .  potassium chloride SA (K-DUR,KLOR-CON) 20 MEQ tablet, Take 1 tablet (20 mEq total) by mouth 2 (two) times daily., Disp: 60 tablet, Rfl: 3 .  senna-docusate (SENOKOT S) 8.6-50 MG tablet, Take 2 tablets by mouth at bedtime as needed for mild constipation., Disp: 30 tablet, Rfl: 6 .  spironolactone (ALDACTONE) 25 MG tablet, Take 0.5 tablets (12.5 mg total) by mouth daily., Disp: 15 tablet, Rfl: 6 .  sucralfate (CARAFATE) 1 GM/10ML suspension, Take 10 mLs (1 g total) by mouth 4 (four) times daily -  with meals and at bedtime., Disp:  420 mL, Rfl: 6 .  torsemide (DEMADEX) 100 MG tablet, Take 0.5 tablets (50 mg total) by mouth 2 (two) times daily., Disp: 30 tablet, Rfl: 3 .  traMADol (ULTRAM) 50 MG tablet, Take 1 tablet (50 mg total) by mouth 3 (three) times daily as needed for severe pain., Disp: 30 tablet, Rfl: 0 .  warfarin (COUMADIN) 5 MG tablet, Take 10 mg (2 tablets) daily except 7.5 mg (1 and 1/2 tablets) on Mon/Wed/Fri., Disp: , Rfl:  .  zolpidem (AMBIEN) 10 MG tablet, Take 1 tablet (10 mg total) by mouth at bedtime as needed for sleep., Disp: 30 tablet, Rfl: 2  Past Medical History: Past Medical History:   Diagnosis Date  . AICD (automatic cardioverter/defibrillator) present   . ASCVD (arteriosclerotic cardiovascular disease)   . Chronic systolic CHF (congestive heart failure) ([place])   . COPD (chronic obstructive pulmonary disease) ([place])   . Coronary artery disease   . Depression   . Diabetes mellitus   . History of cocaine abuse   . Hypertension   . Presence of permanent cardiac pacemaker   . Shortness of breath dyspnea   . Suicidal ideation   . Tobacco abuse     Tobacco Use: History  Smoking Status  . Former Smoker  . Packs/day: 1.50  . Years: 23.00  . Types: Cigarettes  . Start date: 10/24/1992  . Quit date: 07/01/2016  Smokeless Tobacco  . Never Used    Labs: Recent Review Flowsheet Data    Labs for ITP Cardiac and Pulmonary Rehab Latest Ref Rng & Units 06/12/2017 06/13/2017 06/14/2017 06/15/2017 07/13/2017   Cholestrol 0 - 200 mg/dL - - - - -   LDLCALC 0 - 100 mg/dL - - - - -   HDL 40 - 60 mg/dL - - - - -   Trlycerides 0 - 200 mg/dL - - - - -   Hemoglobin A1c 4.8 - 5.6 % - - - - 6.5(H)   PHART 7.350 - 7.450 - - - - -   PCO2ART 32.0 - 48.0 mmHg - - - - -   HCO3 20.0 - 28.0 mmol/L - - - - -   TCO2 0 - 100 mmol/L - - - - -   ACIDBASEDEF 0.0 - 2.0 mmol/L - - - - -   O2SAT % 55.8 68.4 64.3 54.9 -       Exercise Target Goals: Date: 06/29/17  Exercise Program Goal: Individual exercise prescription set with THRR, safety & activity barriers. Participant demonstrates ability to understand and report RPE using BORG scale, to self-measure pulse accurately, and to acknowledge the importance of the exercise prescription.  Exercise Prescription Goal: Starting with aerobic activity 30 plus minutes a day, 3 days per week for initial exercise prescription. Provide home exercise prescription and guidelines that participant acknowledges understanding prior to discharge.  Activity Barriers & Risk Stratification:     Activity Barriers & Cardiac Risk Stratification - 06/29/17 0936       Activity Barriers & Cardiac Risk Stratification   Activity Barriers History of Falls;Balance Concerns;Assistive Device;Deconditioning;Muscular Weakness;Shortness of Breath;Decreased Ventricular Function;Back Problems   Cardiac Risk Stratification High      6 Minute Walk:     6 Minute Walk    Row Name 06/29/17 0930         6 Minute Walk   Phase Initial     Distance 635 feet     Walk Time 4.48 minutes     # of Rest Breaks 1  1:31     MPH 1.61     METS 1.44     RPE 13     Perceived Dyspnea  3     VO2 Peak 5.04     Symptoms Yes (comment)     Comments SOB, chest soreness from walking (Chronic)     Resting HR 59 bpm     Resting BP -  84 dopplar     Resting Oxygen Saturation  94 %     Exercise Oxygen Saturation  during 6 min walk 93 %     Max Ex. HR 61 bpm     Max Ex. BP -  90 dopplar       Interval Oxygen   Interval Oxygen? Yes     Baseline Oxygen Saturation % 94 %     1 Minute Oxygen Saturation % 95 %     1 Minute Liters of Oxygen 0 L  room air     2 Minute Oxygen Saturation % 93 %     2 Minute Liters of Oxygen 0 L     3 Minute Oxygen Saturation % 96 %     3 Minute Liters of Oxygen 0 L     4 Minute Oxygen Saturation % 96 %     4 Minute Liters of Oxygen 0 L     5 Minute Oxygen Saturation % 94 %     5 Minute Liters of Oxygen 0 L     6 Minute Oxygen Saturation % 94 %     6 Minute Liters of Oxygen 0 L     2 Minute Post Oxygen Saturation % 97 %     2 Minute Post Liters of Oxygen 0 L        Oxygen Initial Assessment:   Oxygen Re-Evaluation:   Oxygen Discharge (Final Oxygen Re-Evaluation):   Initial Exercise Prescription:     Initial Exercise Prescription - 06/29/17 0900      Date of Initial Exercise RX and Referring Provider   Date 06/29/17   Referring Provider Glori Bickers MD     Treadmill   MPH 1   Grade 0   Minutes 15   METs 1.77     NuStep   Level 1   SPM 80   Minutes 15   METs 1.4     Biostep-RELP   Level 1   SPM 50    Minutes 15   METs 2     Prescription Details   Frequency (times per week) 3   Duration Progress to 45 minutes of aerobic exercise without signs/symptoms of physical distress     Intensity   THRR 40-80% of Max Heartrate 99-140   Ratings of Perceived Exertion 11-13   Perceived Dyspnea 0-4     Progression   Progression Continue to progress workloads to maintain intensity without signs/symptoms of physical distress.     Resistance Training   Training Prescription Yes   Weight 2 lbs   Reps 10-15      Perform Capillary Blood Glucose checks as needed.  Exercise Prescription Changes:     Exercise Prescription Changes    Row Name 06/29/17 0900             Response to Exercise   Blood Pressure (Admit) -  84 dopplar       Blood Pressure (Exercise) -  90 dopplar       Blood Pressure (Exit) -  68 dopplar       Heart  Rate (Admit) 59 bpm       Heart Rate (Exercise) 61 bpm       Oxygen Saturation (Admit) 94 %       Oxygen Saturation (Exercise) 93 %       Oxygen Saturation (Exit) 97 %       Rating of Perceived Exertion (Exercise) 13       Perceived Dyspnea (Exercise) 3       Symptoms SOB, chest soreness       Comments walk test results and first full day of exercise          Exercise Comments:     Exercise Comments    Row Name 06/29/17 0751           Exercise Comments First full day of exercise!  Patient was oriented to gym and equipment including functions, settings, policies, and procedures.  Patient's individual exercise prescription and treatment plan were reviewed.  All starting workloads were established based on the results of the 6 minute walk test done at initial orientation visit.  The plan for exercise progression was also introduced and progression will be customized based on patient's performance and goals.          Exercise Goals and Review:     Exercise Goals    Row Name 06/29/17 0751             Exercise Goals   Increase Physical Activity  Yes       Intervention Provide advice, education, support and counseling about physical activity/exercise needs.;Develop an individualized exercise prescription for aerobic and resistive training based on initial evaluation findings, risk stratification, comorbidities and participant's personal goals.       Expected Outcomes Achievement of increased cardiorespiratory fitness and enhanced flexibility, muscular endurance and strength shown through measurements of functional capacity and personal statement of participant.       Increase Strength and Stamina Yes       Intervention Provide advice, education, support and counseling about physical activity/exercise needs.;Develop an individualized exercise prescription for aerobic and resistive training based on initial evaluation findings, risk stratification, comorbidities and participant's personal goals.       Expected Outcomes Achievement of increased cardiorespiratory fitness and enhanced flexibility, muscular endurance and strength shown through measurements of functional capacity and personal statement of participant.       Able to understand and use rate of perceived exertion (RPE) scale Yes       Intervention Provide education and explanation on how to use RPE scale       Expected Outcomes Short Term: Able to use RPE daily in rehab to express subjective intensity level;Long Term:  Able to use RPE to guide intensity level when exercising independently       Able to understand and use Dyspnea scale Yes       Intervention Provide education and explanation on how to use Dyspnea scale       Expected Outcomes Short Term: Able to use Dyspnea scale daily in rehab to express subjective sense of shortness of breath during exertion;Long Term: Able to use Dyspnea scale to guide intensity level when exercising independently       Knowledge and understanding of Target Heart Rate Range (THRR) Yes       Intervention Provide education and explanation of THRR  including how the numbers were predicted and where they are located for reference       Expected Outcomes Short Term: Able to state/look up THRR;Long Term: Able  to use THRR to govern intensity when exercising independently;Short Term: Able to use daily as guideline for intensity in rehab       Able to check pulse independently -  patient has an LVAD       Understanding of Exercise Prescription Yes       Intervention Provide education, explanation, and written materials on patient's individual exercise prescription       Expected Outcomes Short Term: Able to explain program exercise prescription;Long Term: Able to explain home exercise prescription to exercise independently          Exercise Goals Re-Evaluation :   Discharge Exercise Prescription (Final Exercise Prescription Changes):     Exercise Prescription Changes - 06/29/17 0900      Response to Exercise   Blood Pressure (Admit) --  84 dopplar   Blood Pressure (Exercise) --  90 dopplar   Blood Pressure (Exit) --  68 dopplar   Heart Rate (Admit) 59 bpm   Heart Rate (Exercise) 61 bpm   Oxygen Saturation (Admit) 94 %   Oxygen Saturation (Exercise) 93 %   Oxygen Saturation (Exit) 97 %   Rating of Perceived Exertion (Exercise) 13   Perceived Dyspnea (Exercise) 3   Symptoms SOB, chest soreness   Comments walk test results and first full day of exercise      Nutrition:  Target Goals: Understanding of nutrition guidelines, daily intake of sodium <1555m, cholesterol <2022m calories 30% from fat and 7% or less from saturated fats, daily to have 5 or more servings of fruits and vegetables.  Biometrics:     Pre Biometrics - 06/29/17 0941      Pre Biometrics   Height 5' 9.75" (1.772 m)   Weight 231 lb (104.8 kg)   Waist Circumference 45 inches   Hip Circumference 43.5 inches   Waist to Hip Ratio 1.03 %   BMI (Calculated) 33.37   Single Leg Stand 12.89 seconds       Nutrition Therapy Plan and Nutrition Goals:      Nutrition Therapy & Goals - 06/29/17 0945      Intervention Plan   Intervention Prescribe, educate and counsel regarding individualized specific dietary modifications aiming towards targeted core components such as weight, hypertension, lipid management, diabetes, heart failure and other comorbidities.   Expected Outcomes Short Term Goal: Understand basic principles of dietary content, such as calories, fat, sodium, cholesterol and nutrients.;Long Term Goal: Adherence to prescribed nutrition plan.;Short Term Goal: A plan has been developed with personal nutrition goals set during dietitian appointment.      Nutrition Discharge: Rate Your Plate Scores:     Nutrition Assessments - 06/29/17 0946      MEDFICTS Scores   Pre Score 6      Nutrition Goals Re-Evaluation:   Nutrition Goals Discharge (Final Nutrition Goals Re-Evaluation):   Psychosocial: Target Goals: Acknowledge presence or absence of significant depression and/or stress, maximize coping skills, provide positive support system. Participant is able to verbalize types and ability to use techniques and skills needed for reducing stress and depression.   Initial Review & Psychosocial Screening:     Initial Psych Review & Screening - 06/29/17 0946      Initial Review   Current issues with Current Depression;History of Depression;Current Anxiety/Panic;Current Psychotropic Meds;Current Stress Concerns   Source of Stress Concerns Chronic Illness;Poor Coping Skills;Family;Unable to participate in former interests or hobbies;Unable to perform yard/household activities;Financial;Transportation;Retirement/disability   Comments [name] has a lot going on.  Has limited support system that  have been in and out of life.      Family Dynamics   Good Support System? Yes     Barriers   Psychosocial barriers to participate in program The patient should benefit from training in stress management and relaxation.;Psychosocial barriers  identified (see note)     Screening Interventions   Interventions Yes;Encouraged to exercise;Program counselor consult;Provide feedback about the scores to participant;To provide support and resources with identified psychosocial needs   Expected Outcomes Short Term goal: Utilizing psychosocial counselor, staff and physician to assist with identification of specific Stressors or current issues interfering with healing process. Setting desired goal for each stressor or current issue identified.;Long Term Goal: Stressors or current issues are controlled or eliminated.;Short Term goal: Identification and review with participant of any Quality of Life or Depression concerns found by scoring the questionnaire.;Long Term goal: The participant improves quality of Life and PHQ9 Scores as seen by post scores and/or verbalization of changes      Quality of Life Scores:      Quality of Life - 06/29/17 0948      Quality of Life Scores   Health/Function Pre 18.68 %   Socioeconomic Pre 20.14 %   Psych/Spiritual Pre 24 %   Family Pre 22.25 %   GLOBAL Pre 20.61 %      PHQ-9: Recent Review Flowsheet Data    Depression screen [facility] 2/9 06/29/2017 11/23/2016   Decreased Interest 0 1   Down, Depressed, Hopeless 0 1   PHQ - 2 Score 0 2   Altered sleeping 2 2   Tired, decreased energy 2 1   Change in appetite 0 0   Feeling bad or failure about yourself  0 2   Trouble concentrating 0 2   Moving slowly or fidgety/restless 0 1   Suicidal thoughts 0 0   PHQ-9 Score 4 10   Difficult doing work/chores Somewhat difficult Not difficult at all     Interpretation of Total Score  Total Score Depression Severity:  1-4 = Minimal depression, 5-9 = Mild depression, 10-14 = Moderate depression, 15-19 = Moderately severe depression, 20-27 = Severe depression   Psychosocial Evaluation and Intervention:     Psychosocial Evaluation - 06/29/17 0932      Psychosocial Evaluation & Interventions   Comments Counselor  met with [facility]) today for initial psychosocial evaluation -as he has returned to this program for the 2nd time this year.  He is a 61 year old who had a LVAD procedure on 09/09/16.  [name] continues to have a strong support system with sever sisters, a mother and a daughter who live locally.  He has diabetes and poor memory issues in addition to his heart condition.  He reports sleeping poorly due to nausea in the evenings particularly.  He has Ambien to take PRN for this but reports he takes it rarely.  He has a good appetite.  [name] states he is in a good mood generally speaking; even though he has multiple stressors with his health and limited finances - as he is on disability and has a lot of medical bills.  [name] admits to a history of mild depression and some current anxiety with racing thoughts which impact his sleep at night.  He is no longer on the heart transplant list.  [name] has goals to increase his stamina and strength while in this program.  Counselor suggested he speak with his Dr. about his sleep problems (nausea) and his racing thoughts/anxiety symptoms and  he states he has an appointment next week and will address this.  Staff will be following with [name] throughout the course of this program.     Expected Outcomes [name] will benefit from consistent exercise to achieve his stated goals.   The educational and psychoeducational components of this program will be helpful in [name] understanding and coping more positively with his medical condition.     Continue Psychosocial Services  Follow up required by staff      Psychosocial Re-Evaluation:   Psychosocial Discharge (Final Psychosocial Re-Evaluation):   Vocational Rehabilitation: Provide vocational rehab assistance to qualifying candidates.   Vocational Rehab Evaluation & Intervention:   Education: Education Goals: Education classes will be provided on a variety of topics geared toward better understanding of heart health and risk factor  modification. Participant will state understanding/return demonstration of topics presented as noted by education test scores.  Learning Barriers/Preferences:     Learning Barriers/Preferences - 06/29/17 0944      Learning Barriers/Preferences   Learning Barriers None   Learning Preferences None      Education Topics: General Nutrition Guidelines/Fats and Fiber: -Group instruction provided by verbal, written material, models and posters to present the general guidelines for heart healthy nutrition. Gives an explanation and review of dietary fats and fiber.   Controlling Sodium/Reading Food Labels: -Group verbal and written material supporting the discussion of sodium use in heart healthy nutrition. Review and explanation with models, verbal and written materials for utilization of the food label.   Exercise Physiology & Risk Factors: - Group verbal and written instruction with models to review the exercise physiology of the cardiovascular system and associated critical values. Details cardiovascular disease risk factors and the goals associated with each risk factor.   Aerobic Exercise & Resistance Training: - Gives group verbal and written discussion on the health impact of inactivity. On the components of aerobic and resistive training programs and the benefits of this training and how to safely progress through these programs.   Flexibility, Balance, General Exercise Guidelines: - Provides group verbal and written instruction on the benefits of flexibility and balance training programs. Provides general exercise guidelines with specific guidelines to those with heart or lung disease. Demonstration and skill practice provided.   Stress Management: - Provides group verbal and written instruction about the health risks of elevated stress, cause of high stress, and healthy ways to reduce stress.   Depression: - Provides group verbal and written instruction on the correlation  between heart/lung disease and depressed mood, treatment options, and the stigmas associated with seeking treatment.   Anatomy & Physiology of the Heart: - Group verbal and written instruction and models provide basic cardiac anatomy and physiology, with the coronary electrical and arterial systems. Review of: AMI, Angina, Valve disease, Heart Failure, Cardiac Arrhythmia, Pacemakers, and the ICD.   Cardiac Procedures: - Group verbal and written instruction to review commonly prescribed medications for heart disease. Reviews the medication, class of the drug, and side effects. Includes the steps to properly store meds and maintain the prescription regimen. (beta blockers and nitrates)   Cardiac Medications I: - Group verbal and written instruction to review commonly prescribed medications for heart disease. Reviews the medication, class of the drug, and side effects. Includes the steps to properly store meds and maintain the prescription regimen.   Cardiac Rehab from 11/23/2016 in [facility] Cardiac and Pulmonary Rehab  Date  11/23/16  Educator  SB  Instruction Review Code (retired)  2- meets goals/outcomes  Cardiac Medications II: -Group verbal and written instruction to review commonly prescribed medications for heart disease. Reviews the medication, class of the drug, and side effects. (all other drug classes)    Go Sex-Intimacy & Heart Disease, Get SMART - Goal Setting: - Group verbal and written instruction through game format to discuss heart disease and the return to sexual intimacy. Provides group verbal and written material to discuss and apply goal setting through the application of the S.M.A.R.T. Method.   Other Matters of the Heart: - Provides group verbal, written materials and models to describe Heart Failure, Angina, Valve Disease, Peripheral Artery Disease, and Diabetes in the realm of heart disease. Includes description of the disease process and treatment options available  to the cardiac patient.   Exercise & Equipment Safety: - Individual verbal instruction and demonstration of equipment use and safety with use of the equipment.   Cardiac Rehab from 11/23/2016 in [facility] Cardiac and Pulmonary Rehab  Date  11/23/16  Educator  CE  Instruction Review Code (retired)  2- meets goals/outcomes      Infection Prevention: - Provides verbal and written material to individual with discussion of infection control including proper hand washing and proper equipment cleaning during exercise session.   Cardiac Rehab from 11/23/2016 in [facility] Cardiac and Pulmonary Rehab  Date  11/23/16  Educator  CE  Instruction Review Code (retired)  2- meets goals/outcomes      Falls Prevention: - Provides verbal and written material to individual with discussion of falls prevention and safety.   Cardiac Rehab from 11/23/2016 in [facility] Cardiac and Pulmonary Rehab  Date  11/23/16  Educator  CE  Instruction Review Code (retired)  2- meets goals/outcomes      Diabetes: - Individual verbal and written instruction to review signs/symptoms of diabetes, desired ranges of glucose level fasting, after meals and with exercise. Acknowledge that pre and post exercise glucose checks will be done for 3 sessions at entry of program.   Cardiac Rehab from 11/23/2016 in [facility]  Date  11/23/16  Educator  CE  Instruction Review Code (retired)  2- meets goals/outcomes      Other: -Provides group and verbal instruction on various topics (see comments)    Knowledge Questionnaire Score:     Knowledge Questionnaire Score - 06/29/17 0944      Knowledge Questionnaire Score   Pre Score 21/28      Core Components/Risk Factors/Patient Goals at Admission:     Personal Goals and Risk Factors at Admission - 06/29/17 0942      Core Components/Risk Factors/Patient Goals on Admission    Weight Management Yes;Obesity;Weight Loss   Intervention Weight Management: Develop a  combined nutrition and exercise program designed to reach desired caloric intake, while maintaining appropriate intake of nutrient and fiber, sodium and fats, and appropriate energy expenditure required for the weight goal.;Weight Management: Provide education and appropriate resources to help participant work on and attain dietary goals.;Weight Management/Obesity: Establish reasonable short term and long term weight goals.;Obesity: Provide education and appropriate resources to help participant work on and attain dietary goals.   Admit Weight 231 lb (104.8 kg)   Goal Weight: Short Term 225 lb (102.1 kg)   Goal Weight: Long Term 221 lb (100.2 kg)   Expected Outcomes Short Term: Continue to assess and modify interventions until short term weight is achieved;Long Term: Adherence to nutrition and physical activity/exercise program aimed toward attainment of established weight goal;Weight Loss: Understanding of general recommendations for a  balanced deficit meal plan, which promotes 1-2 lb weight loss per week and includes a negative energy balance of [phone] kcal/d;Understanding recommendations for meals to include 15-35% energy as protein, 25-35% energy from fat, 35-60% energy from carbohydrates, less than 276m of dietary cholesterol, 20-35 gm of total fiber daily;Understanding of distribution of calorie intake throughout the day with the consumption of 4-5 meals/snacks   Improve shortness of breath with ADL's Yes   Intervention Provide education, individualized exercise plan and daily activity instruction to help decrease symptoms of SOB with activities of daily living.   Expected Outcomes Short Term: Achieves a reduction of symptoms when performing activities of daily living.   Diabetes Yes   Intervention Provide education about signs/symptoms and action to take for hypo/hyperglycemia.;Provide education about proper nutrition, including hydration, and aerobic/resistive exercise prescription along with  prescribed medications to achieve blood glucose in normal ranges: Fasting glucose 65-99 mg/dL   Expected Outcomes Short Term: Participant verbalizes understanding of the signs/symptoms and immediate care of hyper/hypoglycemia, proper foot care and importance of medication, aerobic/resistive exercise and nutrition plan for blood glucose control.;Long Term: Attainment of HbA1C < 7%.   Heart Failure Yes   Intervention Provide a combined exercise and nutrition program that is supplemented with education, support and counseling about heart failure. Directed toward relieving symptoms such as shortness of breath, decreased exercise tolerance, and extremity edema.   Expected Outcomes Improve functional capacity of life;Short term: Attendance in program 2-3 days a week with increased exercise capacity. Reported lower sodium intake. Reported increased fruit and vegetable intake. Reports medication compliance.;Short term: Daily weights obtained and reported for increase. Utilizing diuretic protocols set by physician.;Long term: Adoption of self-care skills and reduction of barriers for early signs and symptoms recognition and intervention leading to self-care maintenance.   Hypertension Yes   Intervention Provide education on lifestyle modifcations including regular physical activity/exercise, weight management, moderate sodium restriction and increased consumption of fresh fruit, vegetables, and low fat dairy, alcohol moderation, and smoking cessation.;Monitor prescription use compliance.   Expected Outcomes Short Term: Continued assessment and intervention until BP is < 140/963mHG in hypertensive participants. < 130/8097mG in hypertensive participants with diabetes, heart failure or chronic kidney disease.;Long Term: Maintenance of blood pressure at goal levels.   Lipids Yes   Intervention Provide education and support for participant on nutrition & aerobic/resistive exercise along with prescribed medications to  achieve LDL <63m33mDL >40mg57mExpected Outcomes Short Term: Participant states understanding of desired cholesterol values and is compliant with medications prescribed. Participant is following exercise prescription and nutrition guidelines.;Long Term: Cholesterol controlled with medications as prescribed, with individualized exercise RX and with personalized nutrition plan. Value goals: LDL < 63mg,68m > 40 mg.   Stress Yes   Intervention Offer individual and/or small group education and counseling on adjustment to heart disease, stress management and health-related lifestyle change. Teach and support self-help strategies.;Refer participants experiencing significant psychosocial distress to appropriate mental health specialists for further evaluation and treatment. When possible, include family members and significant others in education/counseling sessions.   Expected Outcomes Short Term: Participant demonstrates changes in health-related behavior, relaxation and other stress management skills, ability to obtain effective social support, and compliance with psychotropic medications if prescribed.;Long Term: Emotional wellbeing is indicated by absence of clinically significant psychosocial distress or social isolation.      Core Components/Risk Factors/Patient Goals Review:    Core Components/Risk Factors/Patient Goals at Discharge (Final Review):    ITP Comments:  ITP Comments    Row Name 06/29/17 0934 07/13/17 0626         ITP Comments Medical evaluation completed/updated today for return to program.  ITP created and signed by Dr. Emily Filbert, Medical Director.  Documentation for diagnosis can be found in [facility] encounter on 06/20/17. 30 day Review. Continue with ITP unless directed changes per Medical Director Review.          Comments:

## 2017-07-15 ENCOUNTER — Encounter: Payer: Self-pay | Admitting: *Deleted

## 2017-07-15 ENCOUNTER — Telehealth: Payer: Self-pay | Admitting: *Deleted

## 2017-07-15 NOTE — Telephone Encounter (Signed)
Ronald Miller called and left a vm and said "I want to be there in Cardiac Rehab but like probably so many others because of the [place] rain I have a yard full of rain and mud. I am going to prove to you that I will return to Cardiac Rehab but I can't walk through my yard right now".

## 2017-07-17 NOTE — Progress Notes (Signed)
Advanced Heart Failure Clinic Note    Referring Provider: Darylene Price, FNP Primary Care: [facility] Primary Cardiologist: Dr Haroldine Laws.  Transplant Center- [facility]  HPI: Ronald Miller. is a 61 y.o. male with history of chronic systolic HF s/p Med[employer] ICD 2014, CAD s/p CABG in 2000, HTN, Hx of cocaine abuse, Tobacco abuse, Depression, PAF, and COPD. HM3 placed 09/09/2016. In April 2018 he was placed on milrinone but later switched to dobutamine. He remains on dobutamine 7.84mg.   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  06/01/2017- Capsule Endoscopy- normal except  tiny angiectasia, nonbleeding in the right colon.  Today he returns for LVAD follow up. We saw him a few days ago and was volume overloaded. We asked him to increase torsemide to 100/50 for 2 days and to take some metolazone and watch his fluid intake. Also started on midodrine for low MAPs and possible cirrhotic physiology. He also developed some nausea and diarrhea which has now resolved. Says he is feeling better today. Weight down about 2 pounds. Remains on dobutamine. No orthopnea or PND. No bleeding, neuro sx or problems with driveline.    VAD Indication: [facility]  VAD interrogation & Equipment Management: Speed:5900 Flow: 5.3 Power:4.7 w PI:2.7  Alarms: no clinical alarms Events: rare PI event  Fixed speed 5900 Low speed limit: 5600  Primary Controller: Replace back up battery in 236month Back up controller: Replace back up battery in 2933month Annual Equipment Maintenance on UBC/PM was performed   Past Medical History:  Diagnosis Date  . AICD (automatic cardioverter/defibrillator) present   . ASCVD (arteriosclerotic cardiovascular disease)   . Chronic systolic CHF (congestive heart failure) ([place] . COPD (chronic obstructive pulmonary  disease) [place] . Coronary artery disease   . Depression   . Diabetes mellitus   . History of cocaine abuse   . Hypertension   . Presence of permanent cardiac pacemaker   . Shortness of breath dyspnea   . Suicidal ideation   . Tobacco abuse     Current Outpatient Prescriptions  Medication Sig Dispense Refill  . acetaminophen (TYLENOL) 500 MG tablet Take 1,000 mg by mouth every 6 (six) hours as needed for moderate pain or headache.    . aMarland Kitcheniodarone (PACERONE) 200 MG tablet Take 1 tablet (200 mg total) by mouth daily. 30 tablet 6  . blood glucose meter kit and supplies KIT Dispense based on patient and insurance preference. Use up to four times daily as directed. (FOR ICD-9 250.00, 250.01). Please include strips and lancets. 1 each 0  . citalopram (CELEXA) 20 MG tablet Take 1 tablet (20 mg total) by mouth daily. 30 tablet 6  . digoxin (DIGOX) 0.125 MG tablet Take 1 tablet (0.125 mg total) by mouth daily. 30 tablet 6  . DOBUTamine (DOBUTREX) 4-5 MG/ML-% infusion Inject 571.8 mcg/min into the vein continuous. PER [facility]    . docusate sodium (COLACE) 100 MG capsule Take 200 mg by mouth at bedtime.     . gabapentin (NEURONTIN) 100 MG capsule Take 2 capsules (200 mg total) by mouth 2 (two) times daily. 120 capsule 3  . insulin aspart (NOVOLOG FLEXPEN) 100 UNIT/ML FlexPen Inject 5 Units into the skin 3 (three) times daily with meals. 15 mL 11  . Insulin Glargine (LANTUS SOLOSTAR) 100 UNIT/ML Solostar Pen Inject 20 Units into the skin daily  at 10 pm. 15 mL 11  . magnesium oxide (MAG-OX) 400 (241.3 Mg) MG tablet Take 1 tablet (400 mg total) by mouth daily. 30 tablet 6  . midodrine (PROAMATINE) 2.5 MG tablet Take 1 tablet (2.5 mg total) by mouth 3 (three) times daily with meals. 90 tablet 6  . Multiple Vitamin (MULTIVITAMIN WITH MINERALS) TABS tablet Take 1 tablet by mouth daily.    . mupirocin ointment (BACTROBAN) 2 % Apply 1 application topically to leg blisters 2 times daily or as needed 22  g 6  . ondansetron (ZOFRAN ODT) 4 MG disintegrating tablet Take 1 tablet (4 mg total) by mouth every 8 (eight) hours as needed for nausea. 6 tablet 0  . pantoprazole (PROTONIX) 40 MG tablet Take 1 tablet (40 mg total) by mouth 2 (two) times daily before a meal. 60 tablet 6  . potassium chloride SA (K-DUR,KLOR-CON) 20 MEQ tablet Take 1 tablet (20 mEq total) by mouth 2 (two) times daily. 60 tablet 3  . senna-docusate (SENOKOT S) 8.6-50 MG tablet Take 2 tablets by mouth at bedtime as needed for mild constipation. 30 tablet 6  . spironolactone (ALDACTONE) 25 MG tablet Take 0.5 tablets (12.5 mg total) by mouth daily. 15 tablet 6  . sucralfate (CARAFATE) 1 GM/10ML suspension Take 10 mLs (1 g total) by mouth 4 (four) times daily -  with meals and at bedtime. 420 mL 6  . torsemide (DEMADEX) 100 MG tablet Take 0.5 tablets (50 mg total) by mouth 2 (two) times daily. 30 tablet 3  . traMADol (ULTRAM) 50 MG tablet Take 1 tablet (50 mg total) by mouth 3 (three) times daily as needed for severe pain. 30 tablet 0  . warfarin (COUMADIN) 5 MG tablet Take 10 mg (2 tablets) daily except 7.5 mg (1 and 1/2 tablets) on Mon/Wed/Fri.    . zolpidem (AMBIEN) 10 MG tablet Take 1 tablet (10 mg total) by mouth at bedtime as needed for sleep. 30 tablet 2   No current facility-administered medications for this encounter.     Codeine; Trazodone and nefazodone; Lipitor [atorvastatin]; and Tape  REVIEW OF SYSTEMS: All systems negative except as listed in HPI, PMH and Problem list.     Vitals:   07/13/17 0836  BP: 109/66  Pulse: 92  Resp: 16  SpO2: 92%    Wt Readings from Last 3 Encounters:  07/13/17 228 lb (103.4 kg)  07/12/17 232 lb (105.2 kg)  07/07/17 230 lb 6.4 oz (104.5 kg)    Vital Signs:  Doppler Pressure 104              Automatc BP: 109/66 (84) HR:92  SPO2:92  %  Weight: 228 lb w/o eqt Last weight: 230 lb Home weights: 220-225 lbs  Physical Exam: General:  NAD. Looks better. No respiratory  difficulty HEENT: normal  Neck: supple. JVP 10.  Carotids 2+ bilat; no bruits. No lymphadenopathy or thryomegaly appreciated. Cor: LVAD HM-III normal sounds Lungs: Clear. Abdomen: obese soft, nontender, non-distended. No hepatosplenomegaly. No bruits or masses. Good bowel sounds. Driveline site clean. Anchor in place.  Extremities: no cyanosis, clubbing, rash. Warm. 1+ edema  RLE dressing Neuro: alert & oriented x 3. No focal deficits. Moves all 4 without problem         ASSESSMENT AND PLAN: 1. Chronic end-stage biventricular systolic HF: LVEF 69% due to ICM -> s/p Echo 08/24/16 LVEF 15%, RV mild dilated, moderately reduced. 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  Has been turned down for transplant at [facility].  -  Complicated by RV failure. Initially on milrinone but stopped due to low mixed venous saturation.   - Remains on dobutamine 7.5 mcg.  - Recently started on midodrine 2.5 TID for low MAPs and possible cirrhotic physiology.  - Volume status improving but remains elevated. Reinforced need for fluid restriction. Will increase torsemide to 100/50 for next two days and as needed - Renal function stable - Continue sildenafil 40 tid.    - No beta blocker with RV failure.  - VAD parameters stable.  - Warfarin with goal INR 2.0 - 2.5 + ASA 81 daily. INR 1.89. Discussed with PharmD  - Had epistaxis with lovenox, will avoid this in the future.  - Driveline looks good. - VAD parameters reviewed personally. Stable.  2. CAD: Severe 3v- CAD s/p CABG with occluded grafts as above except for LIMA.  -Nos/s of ischemia  3. DMII:  - Per PCP - HgbA1c 6.5 4. Tobacco abuse:  - Reports complete cessation 5. Atrial fibrillation, paroxysmal - Remains in NSR.  Continue amio 200 daily.  6. Left pleural effusion - underwent repeat thoracentesis in Jan. 2018.  - no recurrence  7.  Anxiety/Depression - Continue Celexa 55m daily . - Social issues remain a challenge. He does much better when he is staying with his  sister 884 RUE DVT.  - Continue warfarin.  9. Hyponatremia: Sodium 129. Asymptomatic 10. Anemia, iron-deficiency:   - Has had GI work up with EGD and Capsule Endoscopy showing severe gastropathy.Today hgb is 8.8.  Continue cararfate.  11. RLE- Partial thickness wound on the anterior aspect of RLE - Healing. Continue compression wrap.  .Glori Bickers MD  9:15 PM

## 2017-07-19 ENCOUNTER — Telehealth (HOSPITAL_COMMUNITY): Payer: Self-pay | Admitting: *Deleted

## 2017-07-19 ENCOUNTER — Encounter (HOSPITAL_COMMUNITY): Payer: Self-pay

## 2017-07-19 ENCOUNTER — Other Ambulatory Visit (HOSPITAL_COMMUNITY): Payer: Self-pay | Admitting: *Deleted

## 2017-07-19 DIAGNOSIS — Z95811 Presence of heart assist device: Secondary | ICD-10-CM

## 2017-07-19 NOTE — Telephone Encounter (Signed)
Received call from [facility] on Triage line. Stated patients weight was up 5 pounds in 1 week. Attempted to call patient and left voicemail. Will see him in clinic as scheduled, tomorrow.  Balinda Quails RN, VAD Coordinator 24/7 pager [phone]

## 2017-07-20 ENCOUNTER — Ambulatory Visit (HOSPITAL_COMMUNITY): Payer: Self-pay | Admitting: Pharmacist

## 2017-07-20 ENCOUNTER — Encounter (HOSPITAL_COMMUNITY): Payer: Self-pay

## 2017-07-20 ENCOUNTER — Ambulatory Visit (HOSPITAL_COMMUNITY)
Admission: RE | Admit: 2017-07-20 | Discharge: 2017-07-20 | Disposition: A | Discharge status: Home / Self Care | Payer: Medicare HMO | Source: Ambulatory Visit | Attending: Cardiology | Admitting: Cardiology

## 2017-07-20 VITALS — BP 102/0 | Resp 12 | Ht 69.0 in | Wt 224.0 lb

## 2017-07-20 DIAGNOSIS — Z95811 Presence of heart assist device: Secondary | ICD-10-CM

## 2017-07-20 DIAGNOSIS — I2510 Atherosclerotic heart disease of native coronary artery without angina pectoris: Secondary | ICD-10-CM | POA: Diagnosis not present

## 2017-07-20 DIAGNOSIS — Z4502 Encounter for adjustment and management of automatic implantable cardiac defibrillator: Secondary | ICD-10-CM | POA: Diagnosis not present

## 2017-07-20 DIAGNOSIS — I110 Hypertensive heart disease with heart failure: Secondary | ICD-10-CM | POA: Insufficient documentation

## 2017-07-20 DIAGNOSIS — J449 Chronic obstructive pulmonary disease, unspecified: Secondary | ICD-10-CM | POA: Diagnosis not present

## 2017-07-20 DIAGNOSIS — Z7901 Long term (current) use of anticoagulants: Secondary | ICD-10-CM | POA: Insufficient documentation

## 2017-07-20 DIAGNOSIS — Z79899 Other long term (current) drug therapy: Secondary | ICD-10-CM | POA: Diagnosis not present

## 2017-07-20 DIAGNOSIS — D509 Iron deficiency anemia, unspecified: Secondary | ICD-10-CM | POA: Diagnosis not present

## 2017-07-20 DIAGNOSIS — E119 Type 2 diabetes mellitus without complications: Secondary | ICD-10-CM | POA: Diagnosis not present

## 2017-07-20 DIAGNOSIS — F329 Major depressive disorder, single episode, unspecified: Secondary | ICD-10-CM | POA: Insufficient documentation

## 2017-07-20 DIAGNOSIS — I5022 Chronic systolic (congestive) heart failure: Secondary | ICD-10-CM | POA: Diagnosis not present

## 2017-07-20 DIAGNOSIS — I480 Paroxysmal atrial fibrillation: Secondary | ICD-10-CM

## 2017-07-20 DIAGNOSIS — Z794 Long term (current) use of insulin: Secondary | ICD-10-CM | POA: Diagnosis not present

## 2017-07-20 DIAGNOSIS — E871 Hypo-osmolality and hyponatremia: Secondary | ICD-10-CM | POA: Insufficient documentation

## 2017-07-20 DIAGNOSIS — Z951 Presence of aortocoronary bypass graft: Secondary | ICD-10-CM | POA: Insufficient documentation

## 2017-07-20 DIAGNOSIS — Z95810 Presence of automatic (implantable) cardiac defibrillator: Secondary | ICD-10-CM | POA: Insufficient documentation

## 2017-07-20 LAB — COMPREHENSIVE METABOLIC PANEL
ALT: 18 U/L (ref 17–63)
ANION GAP: 9 (ref 5–15)
AST: 24 U/L (ref 15–41)
Albumin: 3.7 g/dL (ref 3.5–5.0)
Alkaline Phosphatase: 105 U/L (ref 38–126)
BILIRUBIN TOTAL: 0.8 mg/dL (ref 0.3–1.2)
BUN: 31 mg/dL — ABNORMAL HIGH (ref 6–20)
CHLORIDE: 93 mmol/L — AB (ref 101–111)
CO2: 30 mmol/L (ref 22–32)
Calcium: 8.5 mg/dL — ABNORMAL LOW (ref 8.9–10.3)
Creatinine, Ser: 1.36 mg/dL — ABNORMAL HIGH (ref 0.61–1.24)
GFR, EST NON AFRICAN AMERICAN: 55 mL/min — AB (ref >60)
Glucose, Bld: 140 mg/dL — ABNORMAL HIGH (ref 65–99)
POTASSIUM: 4.5 mmol/L (ref 3.5–5.1)
Sodium: 132 mmol/L — ABNORMAL LOW (ref 135–145)
TOTAL PROTEIN: 6.9 g/dL (ref 6.5–8.1)

## 2017-07-20 LAB — CBC
HEMATOCRIT: 29.5 % — AB (ref 39.0–52.0)
Hemoglobin: 9 g/dL — ABNORMAL LOW (ref 13.0–17.0)
MCH: 24.9 pg — AB (ref 26.0–34.0)
MCHC: 30.5 g/dL (ref 30.0–36.0)
MCV: 81.5 fL (ref 78.0–100.0)
PLATELETS: 191 10*3/uL (ref 150–400)
RBC: 3.62 MIL/uL — ABNORMAL LOW (ref 4.22–5.81)
RDW: 18.1 % — AB (ref 11.5–15.5)
WBC: 7.2 10*3/uL (ref 4.0–10.5)

## 2017-07-20 LAB — PROTIME-INR
INR: 1.75
PROTHROMBIN TIME: 20.3 s — AB (ref 11.4–15.2)

## 2017-07-20 LAB — LACTATE DEHYDROGENASE: LDH: 185 U/L (ref 98–192)

## 2017-07-20 MED ORDER — GABAPENTIN 100 MG PO CAPS: 300.0000 mg | ORAL_CAPSULE | Freq: Two times a day (BID) | ORAL | 3 refills | Status: DC

## 2017-07-20 MED ORDER — TORSEMIDE 100 MG PO TABS: ORAL_TABLET | ORAL | 6 refills | Status: DC

## 2017-07-20 NOTE — Progress Notes (Signed)
Patient presents for 1 week  follow up in [facility] today. Reports no problems with VAD equipment or concerns with drive line. He reports adherence to low sodium diet. 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 States he plans to attend NA with a fellow VAD patient and regularly attend cardiac rehab at [facility].   Vital Signs:  Doppler Pressure 102   Automatc BP: unable to obtain HR:73   SPO2:93  %  Weight: 224 lb w/o eqt Last weight: 228 lb Home weights: 221-225 lbs   VAD Indication: Destination Therapy- eval complete at [facility]   VAD interrogation & Equipment Management: Speed:5900 Flow: 5.5 Power:4.8 w    PI:2.4  Alarms: no clinical alarms Events: 5-10 PI events daily  Fixed speed 5900 Low speed limit: 5600  Primary Controller:  Replace back up battery in 78months. Back up controller:   Replace back up battery in 29 months.  Annual Equipment Maintenance on UBC/PM was performed on 09/2017.   I reviewed the LVAD parameters from today and compared the results to the patient's prior recorded data. LVAD interrogation was NEGATIVE for significant power changes, NEGATIVE for clinical alarms and STABLE for PI events/speed drops. No programming changes were made and pump is functioning within specified parameters. Pt is performing daily controller and system monitor self tests along with completing weekly and monthly maintenance for LVAD equipment.  LVAD equipment check completed and is in good working order. Back-up equipment present. Charged back up battery and performed self-test on equipment.   Exit Site Care: Drive line is being maintained weekly  by VAD Coordinators. Drive line exit site well healed and incorporated. The velour is fully implanted at exit site. Dressing dry and intact. No erythema or drainage. Stabilization device present and accurately applied. Pt denies fever or chills. Pt states they have adequate dressing supplies at home.    Significant Events on VAD Support:  11/30/16> Milrinone gtt at discharge 01/27/17>dobutamine at 5 mcg/kg/mingtt at discharge 03/08/17> RV failure 05/19/17 >dobutamine decreased from 7.5 to 6  06/2017> admit with syncope, AMS, Dobutamine at 7.37mcg  Device:Protect Therapies: off; end of service 08/2016 Last check: complete interrogation 05/26/2017. DDD (lower rate 60); BiV pacing with 96% AS-VP   BP & Labs:  MAP102 - unable to obtain accurate automatic BP for comparison  Hgb 9.0 - No S/S of bleeding. Specifically denies melena/BRBPR or nosebleeds.  Plan: 1. RTC in 1 week for VAD visit 2. Remove Unna boots. Use Ace wraps/TED hose every night during sleep. 3. Increase Neurontin to 300mg  BID 4. Adjust INR goal to 1.8-2.4. Maintain level closer to 2. Patient has history of nosebleeds requiring nasal packing when INR elevated above 2.5. Does not tolerate Lovenox. 5. Change Torsemide to 100 mg in the AM and 50 mg in the PM. 6. Reduce fluid intake.  Balinda Quails RN [place] Coordinator   Office: [phone] 24/7 Emergency VAD Pager: [phone]

## 2017-07-20 NOTE — Patient Instructions (Signed)
Increase Gabapentin (neurontin) to 300 mg twice a day.  Increase your Torsemide to 100 mg in the morning and 50 mg in the evening.  We will contact your home health nurse to remove your Una boots.  Return to clinic in 1 week.  Decrease your fluid intake. You are doing great with your diet!

## 2017-07-21 IMAGING — US US EXTREM LOW*R* COMPLETE
1 series · 13 of 25 positions shown · non-contrast
Comparison: Ultrasound dated 02/08/2005

CLINICAL DATA: 59-year-old male with right leg pain and swelling



[Series 1: us extrem low*right* complete · 0.11mm/px · 13 of 42 slices shown]
[im 1/42]
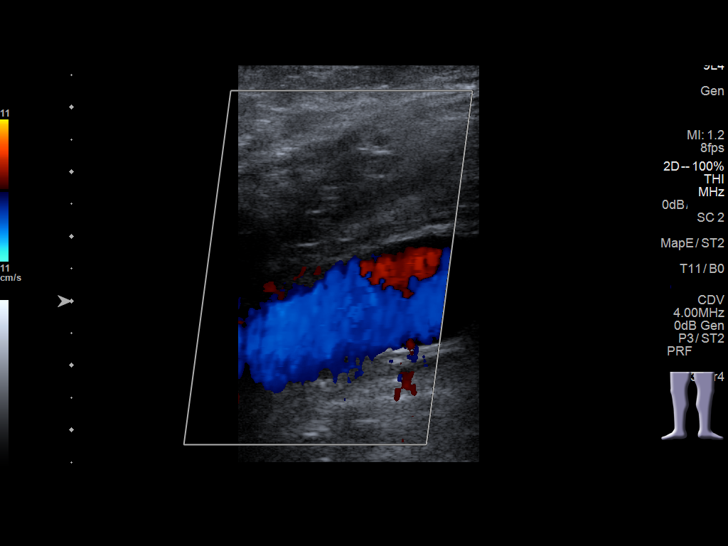
[im 4/42]
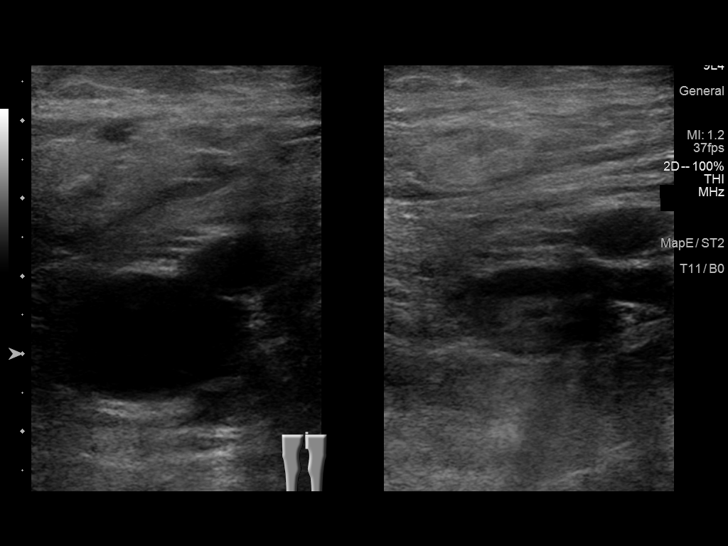
[im 7/42]
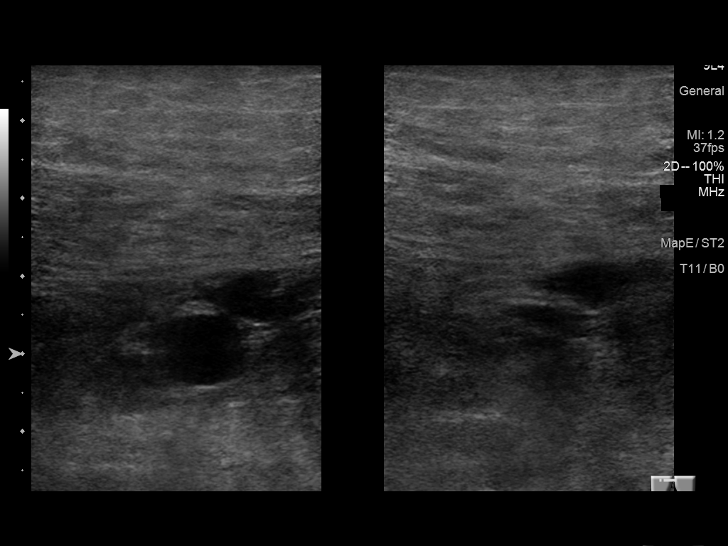
[im 11/42]
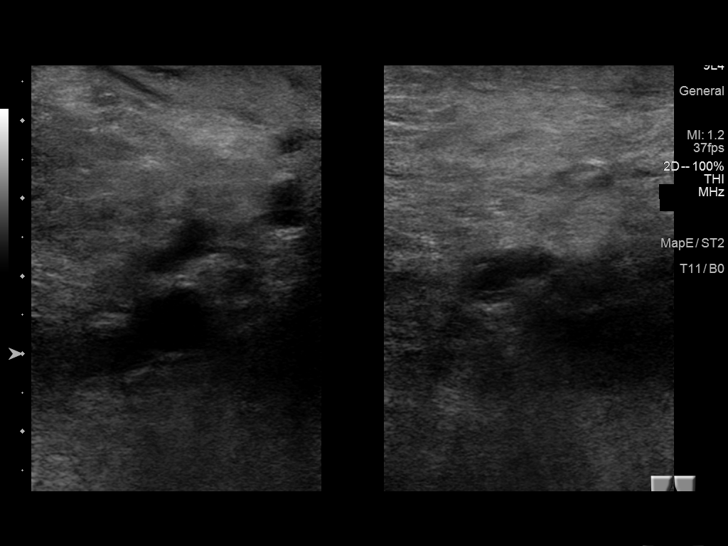
[im 14/42]
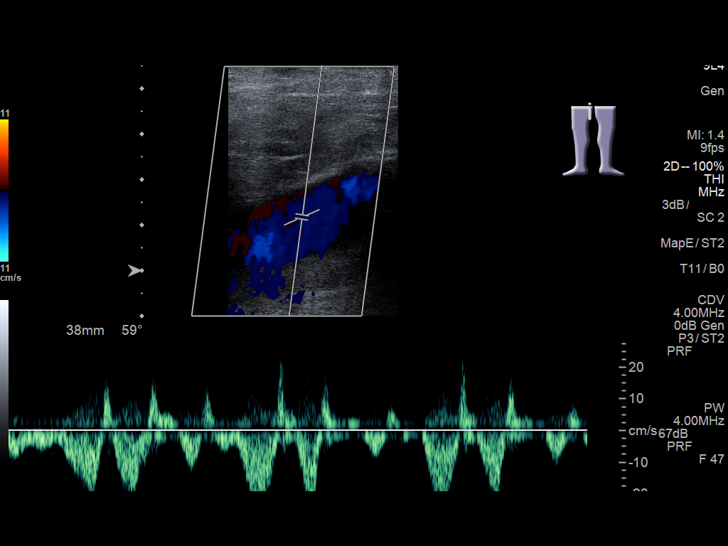
[im 18/42]
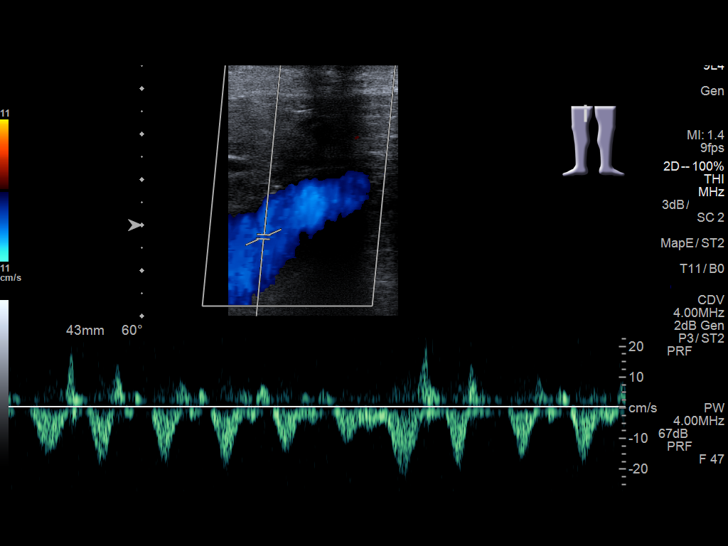
[im 21/42]
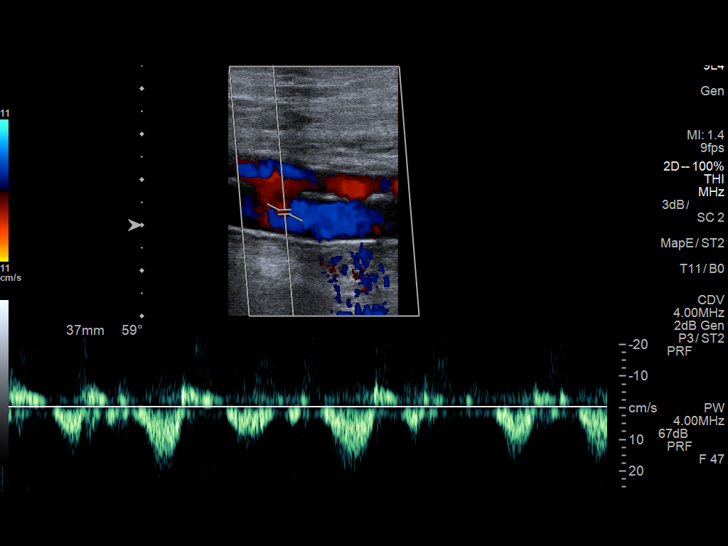
[im 24/42]
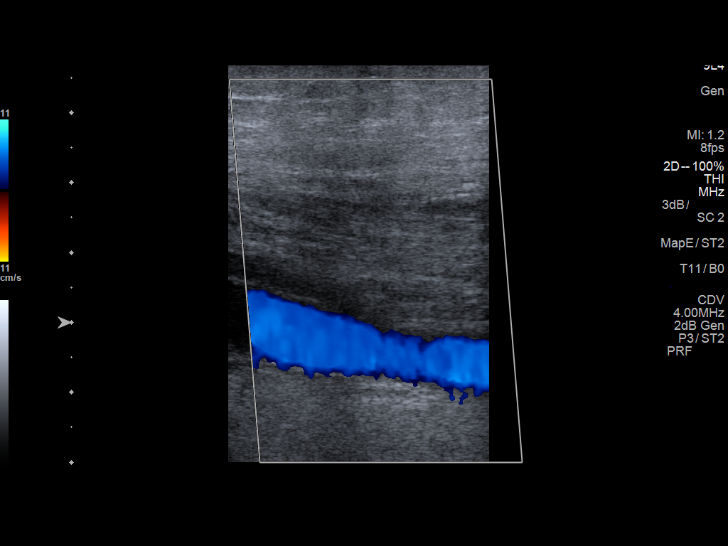
[im 28/42]
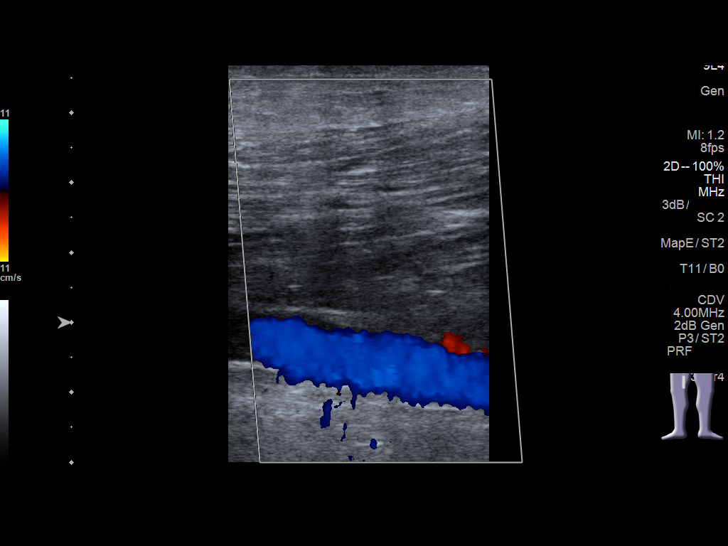
[im 31/42]
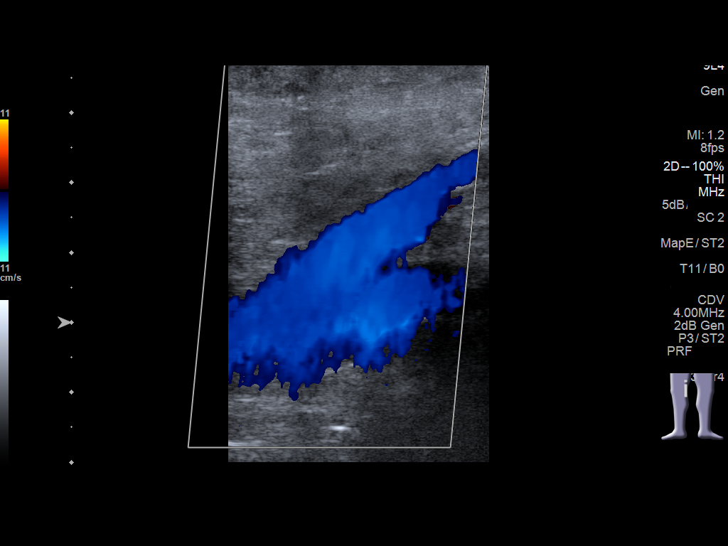
[im 35/42]
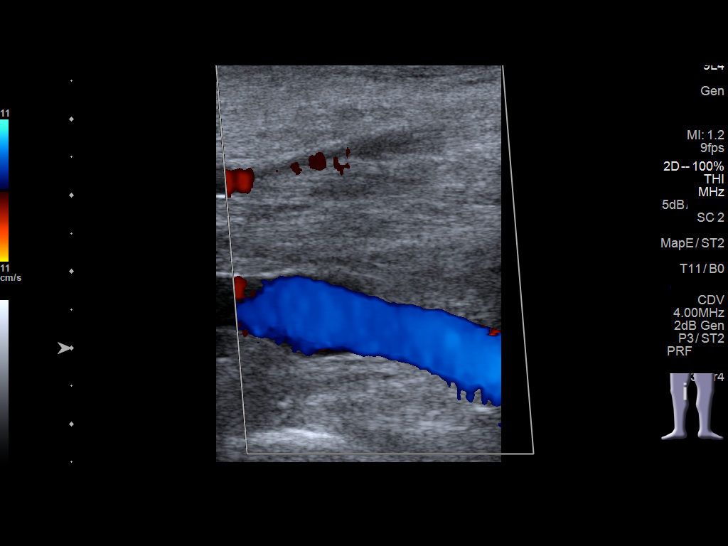
[im 38/42]
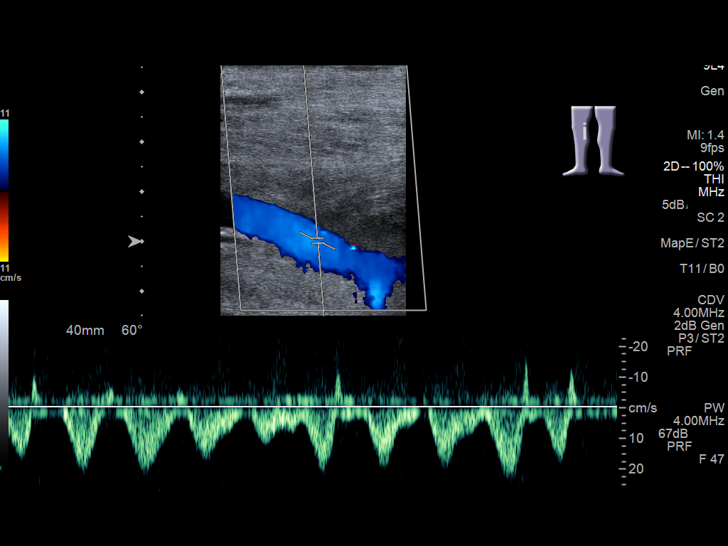
[im 42/42]
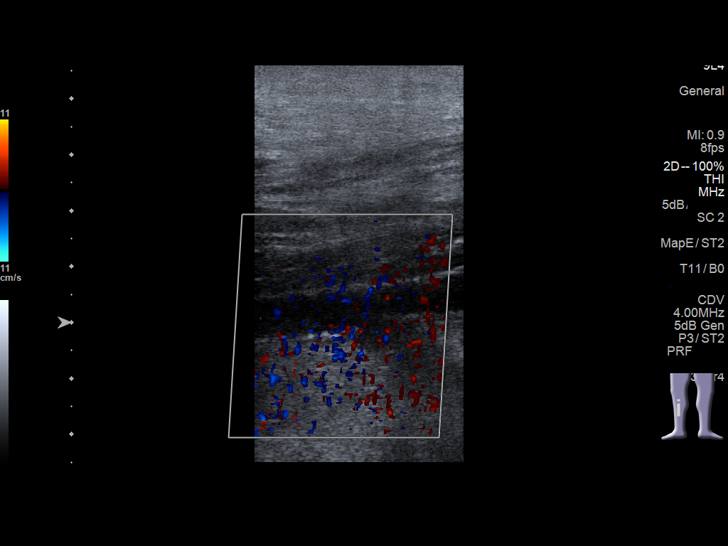

[13 of 25 positions shown; findings below may reference images not displayed]

FINDINGS: Contralateral Common Femoral Vein: Respiratory phasicity is normal
and symmetric with the symptomatic side. No evidence of thrombus.
Normal compressibility.

Common Femoral Vein: No evidence of thrombus. Normal
compressibility, respiratory phasicity and response to augmentation.

Saphenofemoral Junction: No evidence of thrombus. Normal
compressibility and flow on color Doppler imaging.

Profunda Femoral Vein: No evidence of thrombus. Normal
compressibility and flow on color Doppler imaging.

Femoral Vein: No evidence of thrombus. Normal compressibility,
respiratory phasicity and response to augmentation.

Popliteal Vein: No evidence of thrombus. Normal compressibility,
respiratory phasicity and response to augmentation.

Calf Veins: No evidence of thrombus. Normal compressibility and flow
on color Doppler imaging.

Superficial Great Saphenous Vein: No evidence of thrombus. Normal
compressibility and flow on color Doppler imaging.
IMPRESSION: No evidence of deep venous thrombosis in the right lower extremity.

## 2017-07-21 NOTE — Progress Notes (Signed)
CSW met with patient and Clinical biochemist from [place] to introduce patient to the program. Patient is agreeable to begin home support services from [place] discussed support and assistance to be provided in the home and patient appeared open to the program. CSW will continue to follow through the [place] program for care coordination and any other needs as they arise. Raquel Sarna, [place], [place]

## 2017-07-22 ENCOUNTER — Other Ambulatory Visit (HOSPITAL_COMMUNITY): Payer: Self-pay | Admitting: Internal Medicine

## 2017-07-23 NOTE — Progress Notes (Signed)
Advanced Heart Failure Clinic Note    Referring Provider: Darylene Price, FNP Primary Care: [facility] Primary Cardiologist: Dr Haroldine Laws.  Transplant Center- [facility]  HPI: Ronald Miller. is a 61 y.o. male with history of chronic systolic HF s/p Medtronic ICD 2014, CAD s/p CABG in 2000, HTN, Hx of cocaine abuse, Tobacco abuse, Depression, PAF, and COPD. HM3 placed 09/09/2016. In April 2018 he was placed on milrinone but later switched to dobutamine. He remains on dobutamine 7.55mg.   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  06/01/2017- Capsule Endoscopy- normal except  tiny angiectasia, nonbleeding in the right colon.  Returns for LVAD follow up. We have seen him several times recently due to volume overload. Says he feels better today. Weight in the 219-220 range at home which is down a bit but still not at baseline. Still with som DOE and ab bloating. Says he is trying to watch his diet more but still eating ice. Remains on dobutamine. No orthopnea or PND. No bleeding, problems with driveline or neuro sx. No fevers or chills.   VAD Indication: [facility]  VAD interrogation & Equipment Management: Speed:5900 Flow: 5.5 Power:4.8 w PI:2.4  Alarms: no clinical alarms Events: 5-10 PI events daily  Fixed speed 5900 Low speed limit: 5600  Primary Controller: Replace back up battery in 267month Back up controller: Replace back up battery in 297month Annual Equipment Maintenance on UBC/PM was performed   Past Medical History:  Diagnosis Date  . AICD (automatic cardioverter/defibrillator) present   . ASCVD (arteriosclerotic cardiovascular disease)   . Chronic systolic CHF (congestive heart failure) ([facility] . COPD (chronic obstructive pulmonary disease) ([facility] . Coronary artery disease   . Depression   . Diabetes mellitus   . History of cocaine  abuse   . Hypertension   . Presence of permanent cardiac pacemaker   . Shortness of breath dyspnea   . Suicidal ideation   . Tobacco abuse     Current Outpatient Prescriptions  Medication Sig Dispense Refill  . acetaminophen (TYLENOL) 500 MG tablet Take 1,000 mg by mouth every 6 (six) hours as needed for moderate pain or headache.    . aMarland Kitcheniodarone (PACERONE) 200 MG tablet Take 1 tablet (200 mg total) by mouth daily. 30 tablet 6  . blood glucose meter kit and supplies KIT Dispense based on patient and insurance preference. Use up to four times daily as directed. (FOR ICD-9 250.00, 250.01). Please include strips and lancets. 1 each 0  . citalopram (CELEXA) 20 MG tablet Take 1 tablet (20 mg total) by mouth daily. 30 tablet 6  . digoxin (DIGOX) 0.125 MG tablet Take 1 tablet (0.125 mg total) by mouth daily. 30 tablet 6  . DOBUTamine (DOBUTREX) 4-5 MG/ML-% infusion Inject 571.8 mcg/min into the vein continuous. PER [facility]    . docusate sodium (COLACE) 100 MG capsule Take 200 mg by mouth at bedtime.     . gabapentin (NEURONTIN) 100 MG capsule Take 3 capsules (300 mg total) by mouth 2 (two) times daily. 120 capsule 3  . insulin aspart (NOVOLOG FLEXPEN) 100 UNIT/ML FlexPen Inject 5 Units into the skin 3 (three) times daily with meals. 15 mL 11  . Insulin Glargine (LANTUS SOLOSTAR) 100 UNIT/ML Solostar Pen Inject 20 Units into the skin daily at 10 pm. 15 mL 11  . magnesium oxide (MAG-OX) 400 (241.3  Mg) MG tablet Take 1 tablet (400 mg total) by mouth daily. 30 tablet 6  . midodrine (PROAMATINE) 2.5 MG tablet Take 1 tablet (2.5 mg total) by mouth 3 (three) times daily with meals. 90 tablet 6  . Multiple Vitamin (MULTIVITAMIN WITH MINERALS) TABS tablet Take 1 tablet by mouth daily.    . mupirocin ointment (BACTROBAN) 2 % Apply 1 application topically to leg blisters 2 times daily or as needed 22 g 6  . ondansetron (ZOFRAN ODT) 4 MG disintegrating tablet Take 1 tablet (4 mg total) by mouth every 8  (eight) hours as needed for nausea. 6 tablet 0  . pantoprazole (PROTONIX) 40 MG tablet Take 1 tablet (40 mg total) by mouth 2 (two) times daily before a meal. 60 tablet 6  . potassium chloride SA (K-DUR,KLOR-CON) 20 MEQ tablet Take 1 tablet (20 mEq total) by mouth 2 (two) times daily. 60 tablet 3  . senna-docusate (SENOKOT S) 8.6-50 MG tablet Take 2 tablets by mouth at bedtime as needed for mild constipation. 30 tablet 6  . spironolactone (ALDACTONE) 25 MG tablet Take 0.5 tablets (12.5 mg total) by mouth daily. 15 tablet 6  . sucralfate (CARAFATE) 1 GM/10ML suspension Take 10 mLs (1 g total) by mouth 4 (four) times daily -  with meals and at bedtime. 420 mL 6  . torsemide (DEMADEX) 100 MG tablet Take 166m in the morning and 539min the evening 90 tablet 6  . traMADol (ULTRAM) 50 MG tablet Take 1 tablet (50 mg total) by mouth 3 (three) times daily as needed for severe pain. 30 tablet 0  . warfarin (COUMADIN) 5 MG tablet Take 10 mg (2 tablets) daily except 7.5 mg (1 and 1/2 tablets) on Mon/Fri.    . zolpidem (AMBIEN) 10 MG tablet Take 1 tablet (10 mg total) by mouth at bedtime as needed for sleep. 30 tablet 2   No current facility-administered medications for this encounter.     Codeine; Trazodone and nefazodone; Lipitor [atorvastatin]; and Tape  REVIEW OF SYSTEMS: All systems negative except as listed in HPI, PMH and Problem list.     Vitals:   07/20/17 1417  BP: (!) 102/0  Resp: 12  SpO2: 93%    Wt Readings from Last 3 Encounters:  07/20/17 224 lb (101.6 kg)  07/13/17 228 lb (103.4 kg)  07/12/17 232 lb (105.2 kg)    Vital Signs:  Doppler Pressure 102              Automatc BP: unable to obtain HR:73  SPO2:93  %  Weight: 224 lb w/o eqt Last weight: 228 lb Home weights: 221-225 lbs   Physical Exam: General:  Walked into clinic.  NAD.  HEENT: normal  Neck: supple. JVP to jaw  Carotids 2+ bilat; no bruits. No lymphadenopathy or thryomegaly appreciated. Cor: LVAD III  sounds Lungs: Clear. Abdomen: obese soft, nontender, + distended. No hepatosplenomegaly. No bruits or masses. Good bowel sounds. Driveline site clean. Anchor in place.  Extremities: no cyanosis, clubbing, rash. Warm 2+ edema. RLE dressing Neuro: alert & oriented x 3. No focal deficits. Moves all 4 without problem     ASSESSMENT AND PLAN: 1. Chronic end-stage biventricular systolic HF: LVEF 1509%ue to ICM -> s/p Echo 08/24/16 LVEF 15%, RV mild dilated, moderately reduced. 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  [facility] Com[facility] by RV failure. Initially on milrinone but stopped due to low mixed venous saturation.   - Remains  on dobutamine 7.5 mcg.  - Remainson midodrine 2.5 TID for low MAPs and possible cirrhotic physiology.  - Volume status improving but remains elevated. Reinforced need for fluid and ice restriction.  - Increase torsemide to 100/50 - Renal function stable - Continue sildenafil 40 tid.    - No beta blocker with RV failure.  - VAD parameters reviewed personally and are stable.  - Warfarin with goal INR 2.0 - 2.5 + ASA 81 daily. INR 1.75. Discussed with PharmD personally  - Had epistaxis with lovenox, will not use  - Driveline stable. No infectious s/s.  2. CAD: Severe 3v- CAD s/p CABG with occluded grafts as above except for LIMA.  -No s/s ischemia. Continue current therapy.  3. DMII:  - Per PCP - HgbA1c 6.5 4. Tobacco abuse:  - Reports complete cessation 5. Atrial fibrillation, paroxysmal - Remains in NSR.  Continue amio 200 bid 6. Left pleural effusion - underwent repeat thoracentesis in Jan. 2018.  - no recurrence  7.  Anxiety/Depression - Continue Celexa 26m daily . - Social issues remain a challenge. He does much better when he is staying with his sister 822 RUE DVT.  - Continue warfarin.  9. Hyponatremia: Sodium improved at 132.  10. Anemia, iron-deficiency:   - Has had GI work up with EGD and Capsule Endoscopy showing severe  gastropathy.Today hgb is 8.8.  Continue cararfate.  11. RLE- Partial thickness wound on the anterior aspect of RLE - Continues to heal. Continue compression wrap.   Total time spent 35 minutes. Over half that time spent discussing above.    BGlori Bickers MD  11:09 PM

## 2017-07-26 ENCOUNTER — Other Ambulatory Visit (HOSPITAL_COMMUNITY): Payer: Self-pay | Admitting: *Deleted

## 2017-07-26 DIAGNOSIS — Z95811 Presence of heart assist device: Secondary | ICD-10-CM

## 2017-07-27 ENCOUNTER — Telehealth: Payer: Self-pay | Admitting: *Deleted

## 2017-07-27 ENCOUNTER — Inpatient Hospital Stay (HOSPITAL_COMMUNITY): Admission: RE | Admit: 2017-07-27 | Payer: Self-pay | Source: Ambulatory Visit

## 2017-07-27 ENCOUNTER — Telehealth (HOSPITAL_COMMUNITY): Payer: Self-pay | Admitting: *Deleted

## 2017-07-27 ENCOUNTER — Encounter: Payer: Self-pay | Admitting: *Deleted

## 2017-07-27 DIAGNOSIS — I5022 Chronic systolic (congestive) heart failure: Secondary | ICD-10-CM

## 2017-07-27 DIAGNOSIS — Z95811 Presence of heart assist device: Secondary | ICD-10-CM

## 2017-07-27 IMAGING — US US EXTREM LOW VENOUS*R*
1 series · 13 of 24 positions shown · non-contrast
Comparison: 10/13/2015 right lower extremity venous Doppler scan.

CLINICAL DATA: Cellulitis with worsening pain in the right lower
extremity for 2 weeks.



[Series 1: us extrem low venous*right* · 0.09mm/px · 13 of 31 slices shown]
[im 1/31]
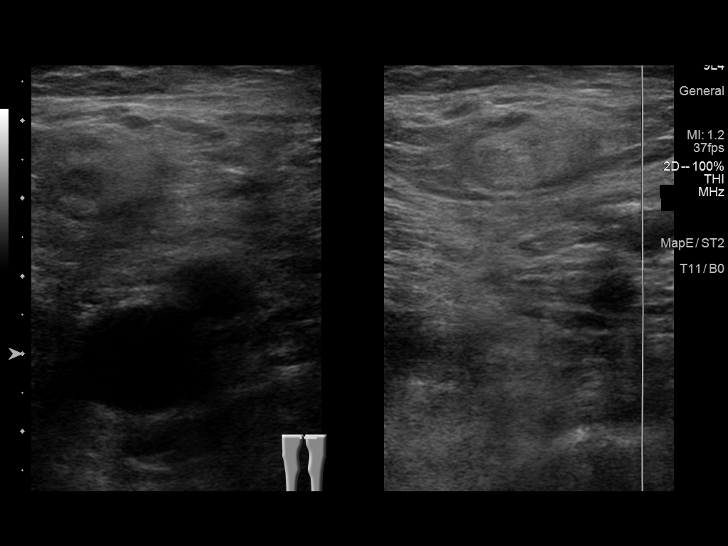
[im 3/31]
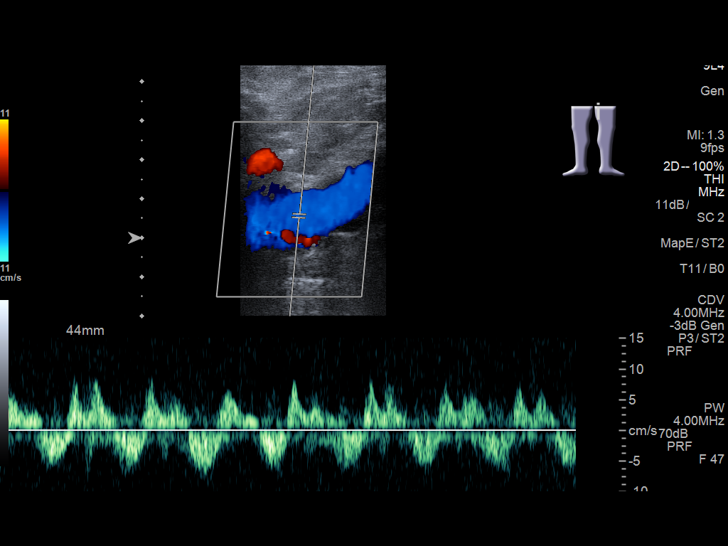
[im 6/31]
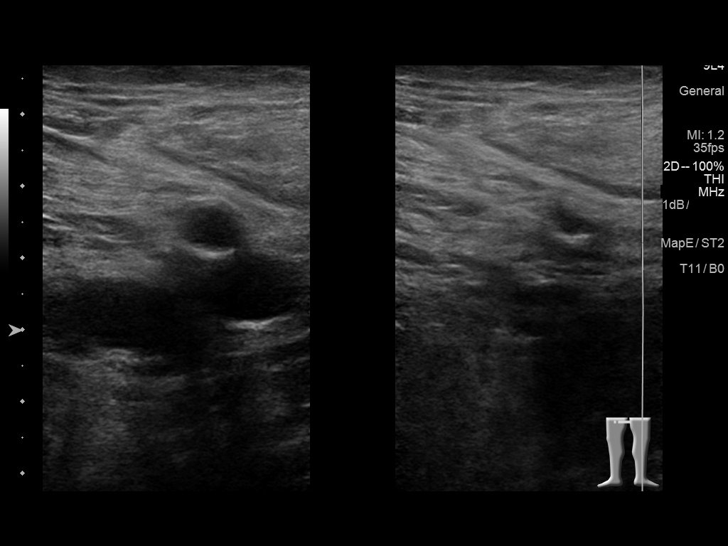
[im 8/31]
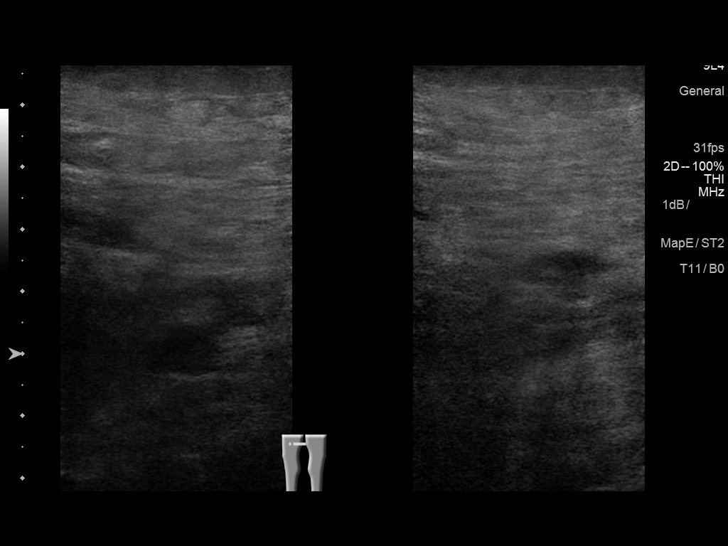
[im 11/31]
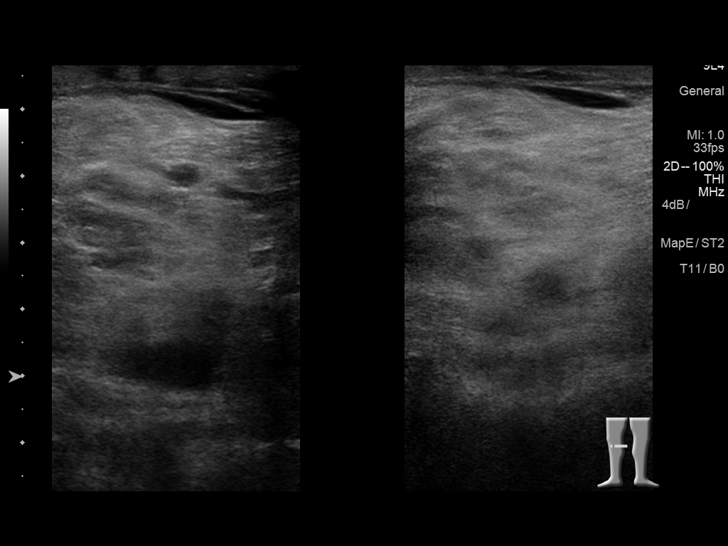
[im 14/31]
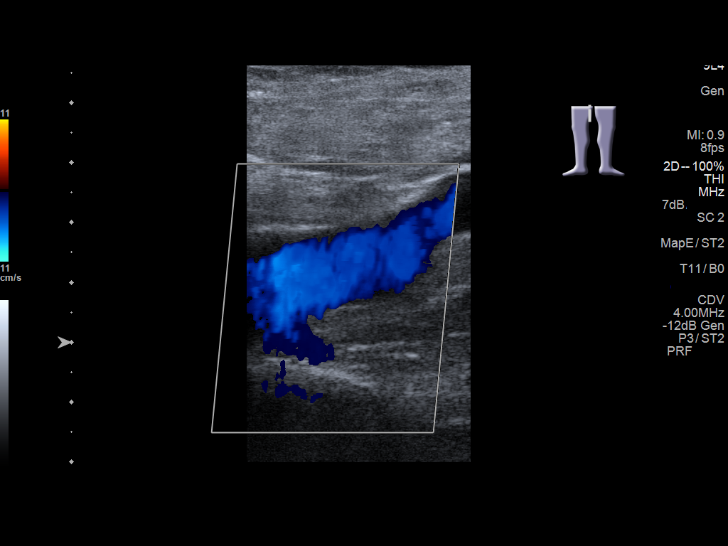
[im 16/31]
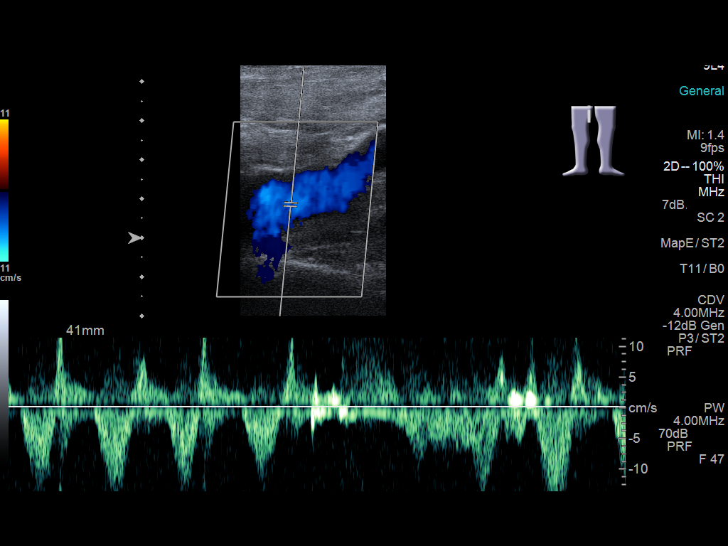
[im 17/31]
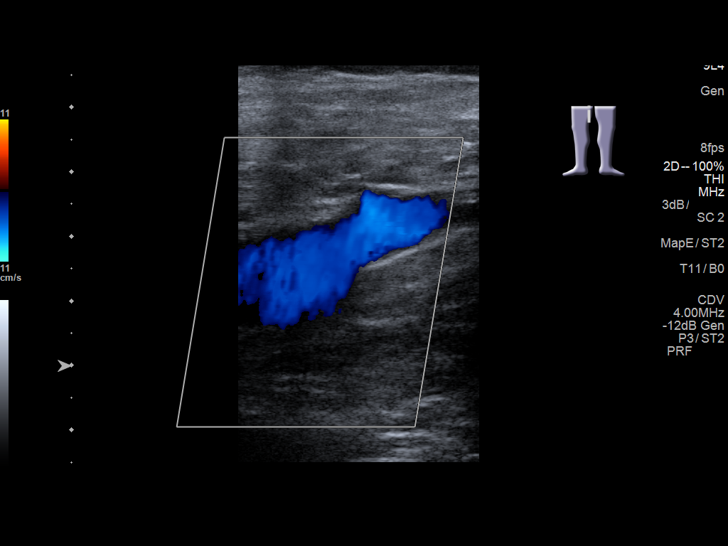
[im 20/31]
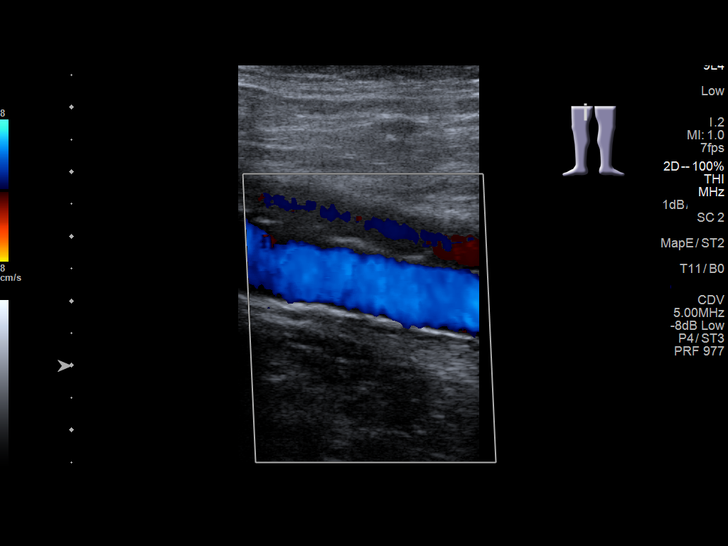
[im 23/31]
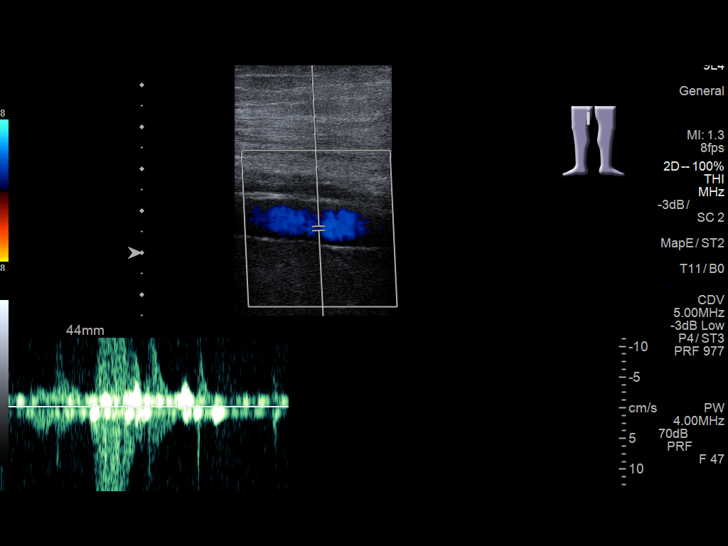
[im 25/31]
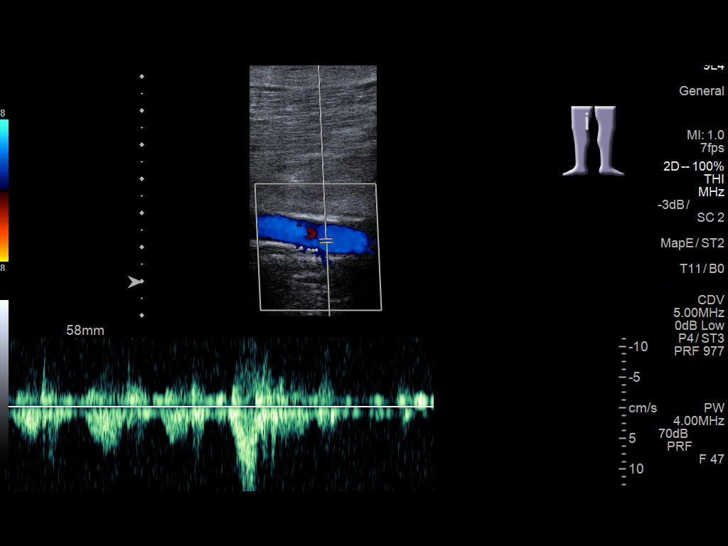
[im 28/31]
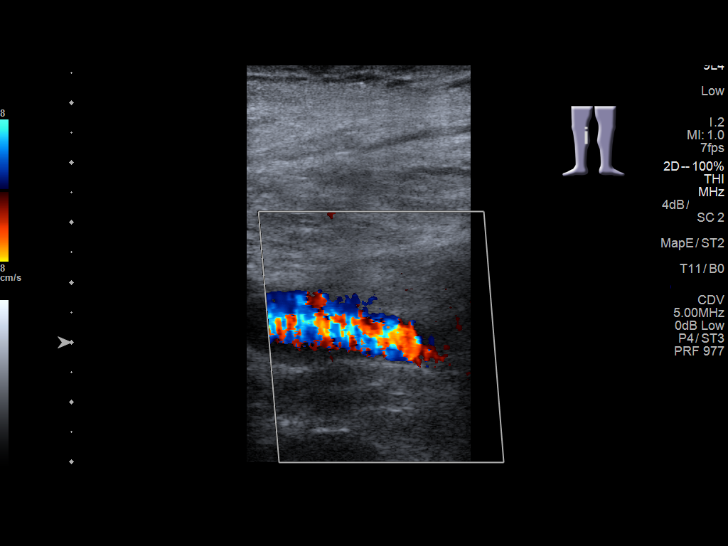
[im 31/31]
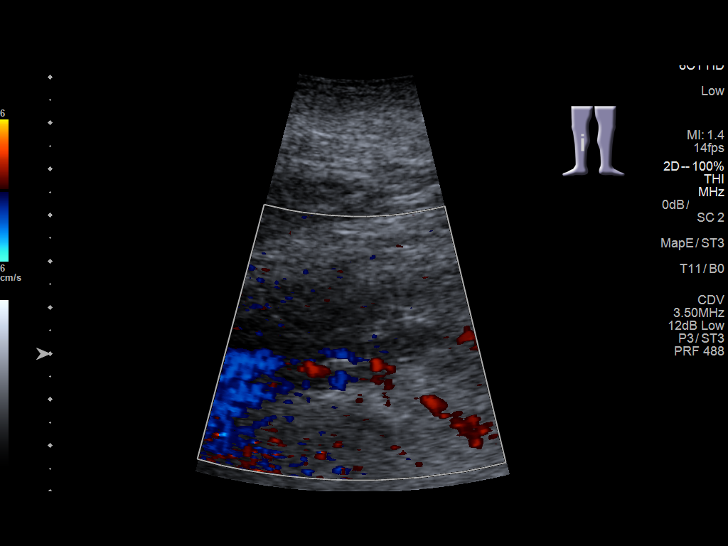

[13 of 24 positions shown; findings below may reference images not displayed]

FINDINGS: Contralateral Common Femoral Vein: Respiratory phasicity is normal
and symmetric with the symptomatic side. No evidence of thrombus.
Normal compressibility.

Common Femoral Vein: No evidence of thrombus. Normal
compressibility, respiratory phasicity and response to augmentation.

Saphenofemoral Junction: No evidence of thrombus. Normal
compressibility and flow on color Doppler imaging.

Profunda Femoral Vein: No evidence of thrombus. Normal
compressibility and flow on color Doppler imaging.

Femoral Vein: No evidence of thrombus. Normal compressibility,
respiratory phasicity and response to augmentation.

Popliteal Vein: No evidence of thrombus. Normal compressibility,
respiratory phasicity and response to augmentation.

Calf Veins: No evidence of thrombus. Normal compressibility and flow
on color Doppler imaging.

Superficial Great Saphenous Vein: No evidence of thrombus. Normal
compressibility and flow on color Doppler imaging.

Venous Reflux:  None.

Other Findings: Prominent superficial edema throughout the right
lower extremity.
IMPRESSION: No evidence of deep venous thrombosis in the right lower extremity.

## 2017-07-27 NOTE — Telephone Encounter (Signed)
Returned call to [name].  He was out today because his car had broken down.  Left message on voicemail.

## 2017-07-27 NOTE — Telephone Encounter (Signed)
Pt called to cancel clinic appointment today; car is in shop and won't be available today. Pt re-scheduled for Friday, 07/29/17 at 11:00 am. Dr. Haroldine Laws updated.

## 2017-07-29 ENCOUNTER — Telehealth: Payer: Self-pay | Admitting: *Deleted

## 2017-07-29 ENCOUNTER — Inpatient Hospital Stay (HOSPITAL_COMMUNITY): Admission: RE | Admit: 2017-07-29 | Payer: Self-pay | Source: Ambulatory Visit

## 2017-07-29 ENCOUNTER — Telehealth (HOSPITAL_COMMUNITY): Payer: Self-pay | Admitting: *Deleted

## 2017-07-29 NOTE — Telephone Encounter (Signed)
Ronald Miller left a message that he would miss class today as he has a cold.  Hopes to return on Monday.

## 2017-07-29 NOTE — Telephone Encounter (Signed)
Pt called to cancel VAD clinic appointment today; c/o having a cold and now wanting to "get out in the cold and rain". Pt says he will call Monday and re-schedule appointment if feeling better by then.

## 2017-07-30 IMAGING — US US ABDOMEN LIMITED
1 series · 9 of 9 positions shown · non-contrast
Comparison: KUB of 02/24/2016 and ultrasound abdomen of 02/11/2016

CLINICAL DATA: Localization of ascites

EXAM:
LIMITED ABDOMEN ULTRASOUND FOR ASCITES
TECHNIQUE: Limited ultrasound survey for ascites was performed in all four
abdominal quadrants.

[Series 1: us abdomen limited · 0.26mm/px · 9 of 9 slices shown]
[im 1/9]
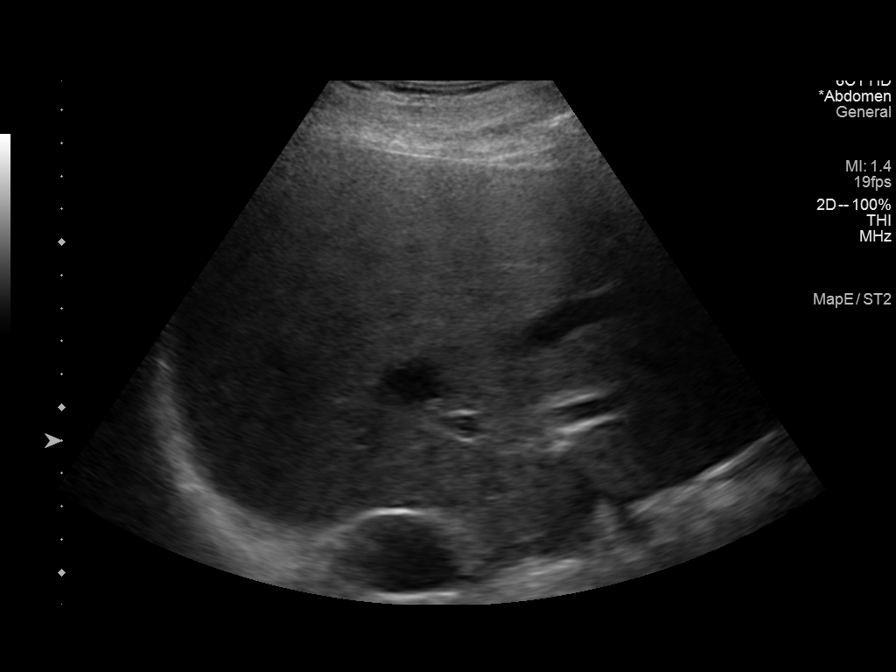
[im 2/9]
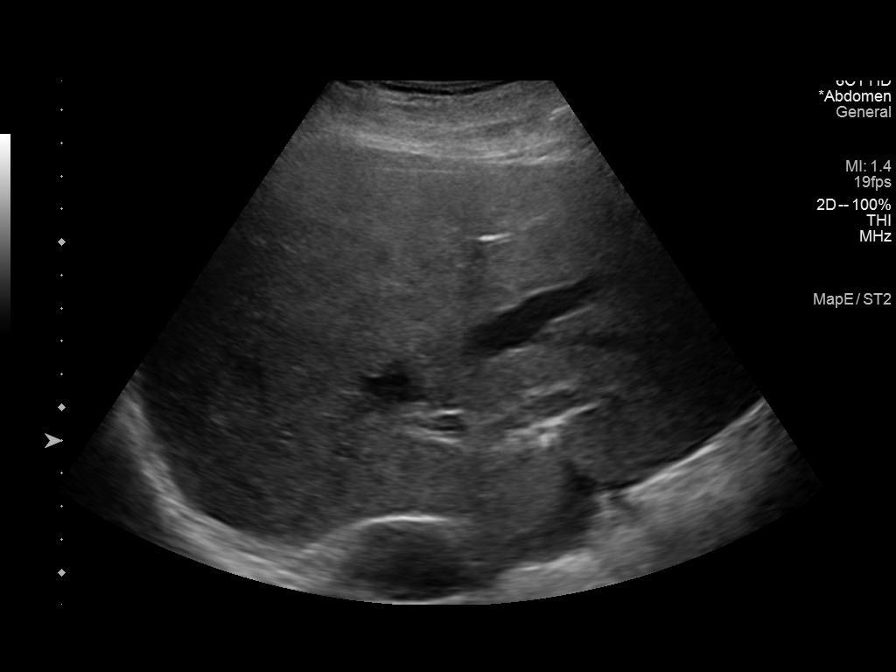
[im 3/9]
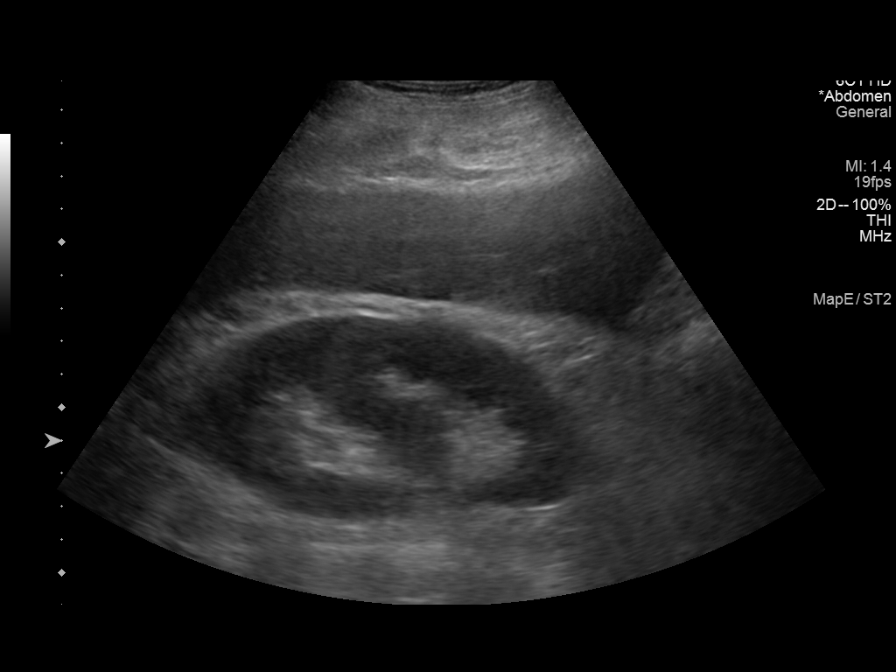
[im 4/9]
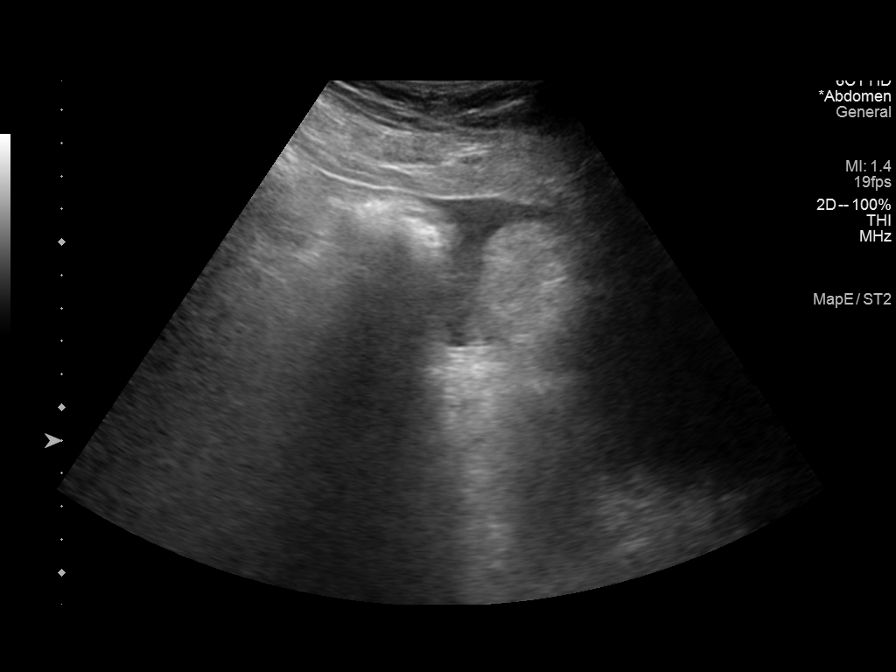
[im 5/9]
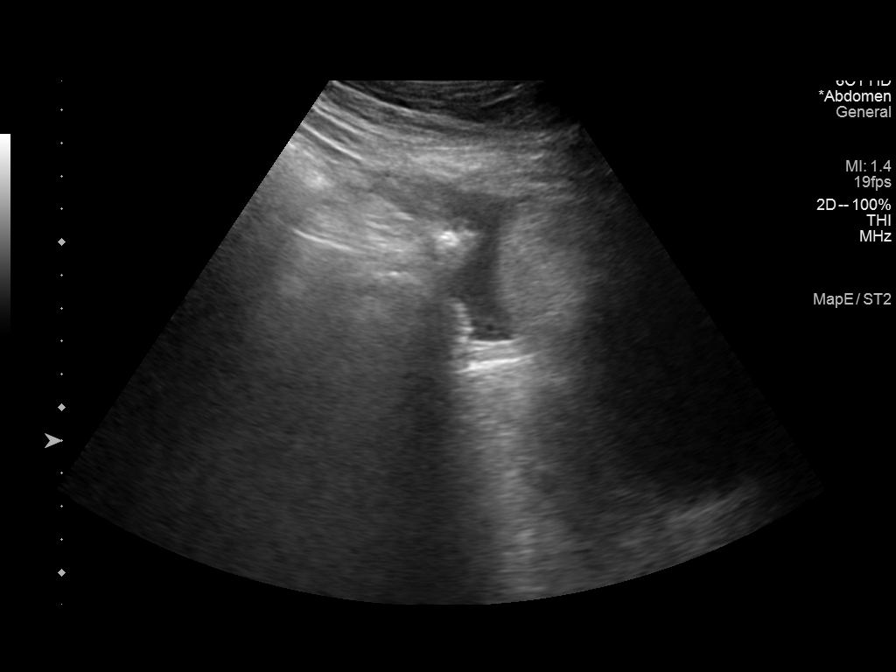
[im 6/9]
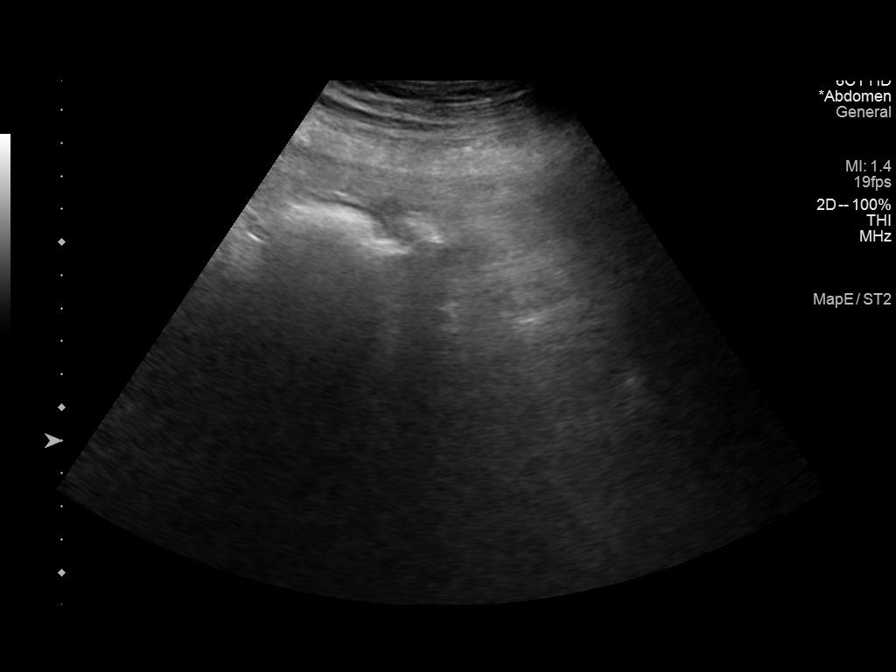
[im 7/9]
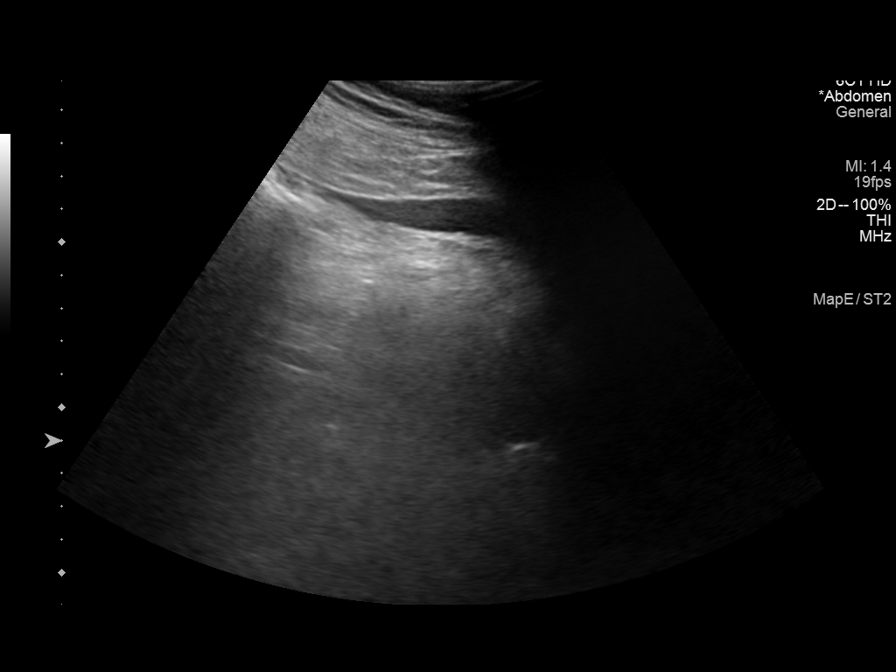
[im 8/9]
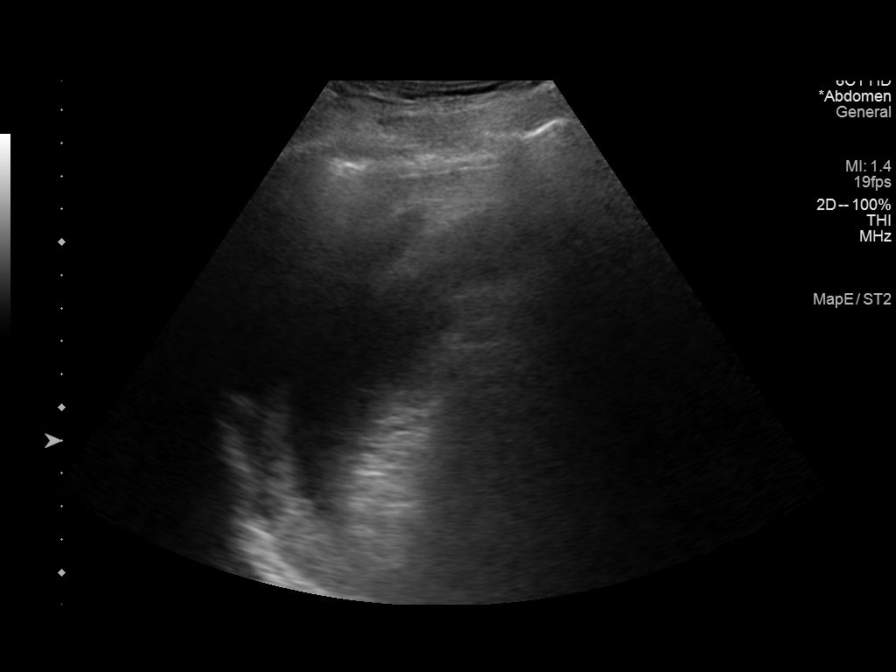
[im 9/9]
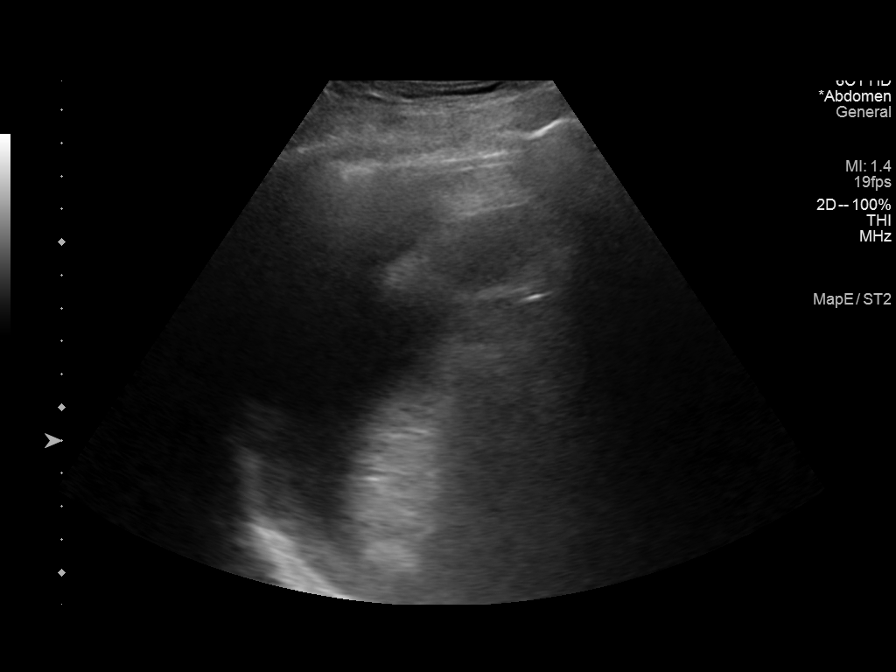

[9 of 9 positions shown; findings below may reference images not displayed]

FINDINGS: Ultrasound was performed to localize ascites for possible
paracentesis. No satisfactory fluid collection is seen for
paracentesis to be safely performed.
IMPRESSION: Insufficient ascites for paracentesis.

## 2017-08-02 ENCOUNTER — Ambulatory Visit (HOSPITAL_COMMUNITY)
Admission: RE | Admit: 2017-08-02 | Discharge: 2017-08-02 | Disposition: A | Discharge status: Home / Self Care | Payer: Medicare HMO | Source: Ambulatory Visit | Attending: Cardiology | Admitting: Cardiology

## 2017-08-02 ENCOUNTER — Ambulatory Visit (HOSPITAL_COMMUNITY): Payer: Self-pay | Admitting: Pharmacist

## 2017-08-02 ENCOUNTER — Encounter (HOSPITAL_COMMUNITY): Payer: Self-pay | Admitting: Unknown Physician Specialty

## 2017-08-02 VITALS — BP 91/77 | HR 81 | Ht 69.0 in | Wt 229.6 lb

## 2017-08-02 DIAGNOSIS — E871 Hypo-osmolality and hyponatremia: Secondary | ICD-10-CM | POA: Diagnosis not present

## 2017-08-02 DIAGNOSIS — Z794 Long term (current) use of insulin: Secondary | ICD-10-CM | POA: Diagnosis not present

## 2017-08-02 DIAGNOSIS — I2510 Atherosclerotic heart disease of native coronary artery without angina pectoris: Secondary | ICD-10-CM | POA: Insufficient documentation

## 2017-08-02 DIAGNOSIS — E1169 Type 2 diabetes mellitus with other specified complication: Secondary | ICD-10-CM

## 2017-08-02 DIAGNOSIS — Z950 Presence of cardiac pacemaker: Secondary | ICD-10-CM | POA: Insufficient documentation

## 2017-08-02 DIAGNOSIS — Z7901 Long term (current) use of anticoagulants: Secondary | ICD-10-CM | POA: Diagnosis not present

## 2017-08-02 DIAGNOSIS — Z79899 Other long term (current) drug therapy: Secondary | ICD-10-CM | POA: Insufficient documentation

## 2017-08-02 DIAGNOSIS — Z95811 Presence of heart assist device: Secondary | ICD-10-CM

## 2017-08-02 DIAGNOSIS — S80921D Unspecified superficial injury of right lower leg, subsequent encounter: Secondary | ICD-10-CM | POA: Insufficient documentation

## 2017-08-02 DIAGNOSIS — E119 Type 2 diabetes mellitus without complications: Secondary | ICD-10-CM | POA: Insufficient documentation

## 2017-08-02 DIAGNOSIS — F419 Anxiety disorder, unspecified: Secondary | ICD-10-CM | POA: Insufficient documentation

## 2017-08-02 DIAGNOSIS — Z951 Presence of aortocoronary bypass graft: Secondary | ICD-10-CM | POA: Insufficient documentation

## 2017-08-02 DIAGNOSIS — F1410 Cocaine abuse, uncomplicated: Secondary | ICD-10-CM | POA: Insufficient documentation

## 2017-08-02 DIAGNOSIS — I5022 Chronic systolic (congestive) heart failure: Secondary | ICD-10-CM | POA: Diagnosis not present

## 2017-08-02 DIAGNOSIS — K319 Disease of stomach and duodenum, unspecified: Secondary | ICD-10-CM | POA: Diagnosis not present

## 2017-08-02 DIAGNOSIS — F329 Major depressive disorder, single episode, unspecified: Secondary | ICD-10-CM | POA: Insufficient documentation

## 2017-08-02 DIAGNOSIS — I480 Paroxysmal atrial fibrillation: Secondary | ICD-10-CM | POA: Diagnosis not present

## 2017-08-02 DIAGNOSIS — Z87891 Personal history of nicotine dependence: Secondary | ICD-10-CM | POA: Diagnosis not present

## 2017-08-02 DIAGNOSIS — D509 Iron deficiency anemia, unspecified: Secondary | ICD-10-CM | POA: Insufficient documentation

## 2017-08-02 DIAGNOSIS — Z7982 Long term (current) use of aspirin: Secondary | ICD-10-CM | POA: Diagnosis not present

## 2017-08-02 DIAGNOSIS — J449 Chronic obstructive pulmonary disease, unspecified: Secondary | ICD-10-CM | POA: Diagnosis not present

## 2017-08-02 DIAGNOSIS — X58XXXD Exposure to other specified factors, subsequent encounter: Secondary | ICD-10-CM | POA: Insufficient documentation

## 2017-08-02 DIAGNOSIS — I82621 Acute embolism and thrombosis of deep veins of right upper extremity: Secondary | ICD-10-CM | POA: Diagnosis not present

## 2017-08-02 DIAGNOSIS — I110 Hypertensive heart disease with heart failure: Secondary | ICD-10-CM | POA: Insufficient documentation

## 2017-08-02 DIAGNOSIS — I5082 Biventricular heart failure: Secondary | ICD-10-CM | POA: Diagnosis present

## 2017-08-02 LAB — CBC
HEMATOCRIT: 30 % — AB (ref 39.0–52.0)
HEMOGLOBIN: 8.7 g/dL — AB (ref 13.0–17.0)
MCH: 23 pg — ABNORMAL LOW (ref 26.0–34.0)
MCHC: 29 g/dL — ABNORMAL LOW (ref 30.0–36.0)
MCV: 79.2 fL (ref 78.0–100.0)
Platelets: 186 10*3/uL (ref 150–400)
RBC: 3.79 MIL/uL — ABNORMAL LOW (ref 4.22–5.81)
RDW: 19.2 % — ABNORMAL HIGH (ref 11.5–15.5)
WBC: 6.6 10*3/uL (ref 4.0–10.5)

## 2017-08-02 LAB — BASIC METABOLIC PANEL
ANION GAP: 8 (ref 5–15)
BUN: 31 mg/dL — ABNORMAL HIGH (ref 6–20)
CO2: 35 mmol/L — ABNORMAL HIGH (ref 22–32)
Calcium: 8.9 mg/dL (ref 8.9–10.3)
Chloride: 92 mmol/L — ABNORMAL LOW (ref 101–111)
Creatinine, Ser: 1.35 mg/dL — ABNORMAL HIGH (ref 0.61–1.24)
GFR, EST NON AFRICAN AMERICAN: 56 mL/min — AB (ref >60)
Glucose, Bld: 152 mg/dL — ABNORMAL HIGH (ref 65–99)
Potassium: 3.8 mmol/L (ref 3.5–5.1)
SODIUM: 135 mmol/L (ref 135–145)

## 2017-08-02 LAB — LACTATE DEHYDROGENASE: LDH: 168 U/L (ref 98–192)

## 2017-08-02 LAB — PROTIME-INR
INR: 3.69
PROTHROMBIN TIME: 36.3 s — AB (ref 11.4–15.2)

## 2017-08-02 NOTE — Progress Notes (Signed)
Patient presents for 1 week  follow up in [facility] today. Reports no problems with VAD equipment or concerns with drive line. He reports adherence to low sodium diet. States he has difficulty sleeping still. He is supposed to start cardiac rehab at [facility] tomorrow.  States he gets dizzy at times but overall the dizziness has improved.    Vital Signs:  Doppler Pressure 92  Automatc BP: 91/77  (85) HR:73   SPO2:93  %  Weight: 229.6 lb w/o eqt Last weight: 224 lb Home weights: 221-225 lbs   VAD Indication: Destination Therapy- eval complete at [facility]   VAD interrogation & Equipment Management: Speed:5900 Flow: 5.5 Power:4.6 w    PI:2.5  Alarms: no clinical alarms Events: 5-10 PI events daily  Fixed speed 5900 Low speed limit: 5600 HCT: 30  Primary Controller:  Replace back up battery in 27 months. Back up controller:   Replace back up battery in 27 months.  Annual Equipment Maintenance on UBC/PM was performed on 09/2016.   I reviewed the LVAD parameters from today and compared the results to the patient's prior recorded data. LVAD interrogation was NEGATIVE for significant power changes, NEGATIVE for clinical alarms and STABLE for PI events/speed drops. No programming changes were made and pump is functioning within specified parameters. Pt is performing daily controller and system monitor self tests along with completing weekly and monthly maintenance for LVAD equipment.  LVAD equipment check completed and is in good working order. Back-up equipment present. Charged back up battery and performed self-test on equipment.   Exit Site Care: Drive line is being maintained weekly  by VAD Coordinators. Drive line exit site well healed and incorporated. The velour is fully implanted at exit site. Dressing dry and intact. No erythema or drainage. Stabilization device present and accurately applied. Pt denies fever or chills. Pt states they have adequate dressing supplies at home.    Significant Events on VAD Support:  11/30/16> Milrinone gtt at discharge 01/27/17>dobutamine at 5 mcg/kg/mingtt at discharge 03/08/17> RV failure 05/19/17 >dobutamine decreased from 7.5 to 6  06/2017> admit with syncope, AMS, Dobutamine at 7.36mcg  Device:Protect Therapies: off; end of service 08/2016 Last check: complete interrogation 05/26/2017. DDD (lower rate 60); BiV pacing with 96% AS-VP   BP & Labs:  MAP 92 - modified sytolic  Hgb 8.7 - No S/S of bleeding. Specifically denies melena/BRBPR or nosebleeds.  Plan: 1. Return to clinic in 2 wks for dressing change. 2. Return to clinic in 1 month for VAD clinic with annual maintenance.    Tanda Rockers RN [place] Coordinator   Office: [phone] 24/7 Emergency VAD Pager: [phone]

## 2017-08-02 NOTE — Progress Notes (Signed)
Advanced Heart Failure Clinic Note    Referring Provider: Darylene Price, FNP Primary Care: [facility] Primary Cardiologist: Dr Haroldine Laws.  Transplant [facility]- [facility]  HPI: Ronald Miller. is a 61 y.o. male  with history of chronic systolic HF s/p Medtronic ICD 2014, CAD s/p CABG in 2000, HTN, Hx of cocaine abuse, Tobacco abuse, Depression, PAF, and COPD. HM3 placed 09/09/2016. In April 2018 he was placed on milrinone but later switched to dobutamine. He remains on dobutamine 7.72mg.   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  06/01/2017- Capsule Endoscopy- normal except  tiny angiectasia, nonbleeding in the right colon.  Pt presents today for regular follow up.  Last visit torsemide increased and midodrine added. PT feeling better since. Weight at home 221 this am. Abdominal bloating improved. Trying to eat in more and prepare more healthy foods. Trying to watch fluid but still eats ice on occasional.  Has occasional lightheadedness still, 2-3 times weekly. Has improved since midodrine.  Breathing has improved. Denies cough, fever, or chills. [facility]. No orthopnea or PND. Denies bleeding, neuro [place], or problems with his driveline. 1 "No external power alarm". Pt states he was plugged into wall power, and got up retrieve something and pulled the plug out of the wall.   [facility]  VAD interrogation & Equipment Management: Speed: 5900 Flow: 5.5 Power: 4.6 w PI: 2.5  Alarms: 1 No external power alarm. Re-educated on importance of constant power supply and connections.  Events: 5-10 PI events daily.   Fixed speed: 5900 Low speed limit: 5600  Primary Controller: Replace back up battery in 27 months. Back up controller: Replace back up battery in 2101month  Annual Equipment Maintenance on UBC/PM has been  performed   Past Medical History:  Diagnosis Date  . AICD (automatic cardioverter/defibrillator) present   . ASCVD (arteriosclerotic cardiovascular disease)   . Chronic systolic CHF (congestive heart failure) ([place]  . COPD (chronic obstructive pulmonary disease) ([facility]  . Coronary artery disease   . Depression   . Diabetes mellitus   . History of cocaine abuse   . Hypertension   . Presence of permanent cardiac pacemaker   . Shortness of breath dyspnea   . Suicidal ideation   . Tobacco abuse     Current Outpatient Prescriptions  Medication Sig Dispense Refill  . acetaminophen (TYLENOL) 500 MG tablet Take 1,000 mg by mouth every 6 (six) hours as needed for moderate pain or headache.    . Marland Kitchenmiodarone (PACERONE) 200 MG tablet Take 1 tablet (200 mg total) by mouth daily. 30 tablet 6  . blood glucose meter kit and supplies KIT Dispense based on patient and insurance preference. Use up to four times daily as directed. (FOR ICD-9 250.00, 250.01). Please include strips and lancets. 1 each 0  . citalopram (CELEXA) 20 MG tablet Take 1 tablet (20 mg total) by mouth daily. 30 tablet 6  . digoxin (DIGOX) 0.125 MG tablet Take 1 tablet (0.125 mg total) by mouth daily. 30 tablet 6  . DOBUTamine (DOBUTREX) 4-5 MG/ML-% infusion Inject 571.8 mcg/min into the vein continuous. PER [facility]    . docusate sodium (COLACE) 100 MG capsule Take 200 mg by mouth at bedtime.     . gabapentin (NEURONTIN) 100 MG capsule Take 3 capsules (300 mg total) by mouth 2 (two) times  daily. 120 capsule 3  . insulin aspart (NOVOLOG FLEXPEN) 100 UNIT/ML FlexPen Inject 5 Units into the skin 3 (three) times daily with meals. 15 mL 11  . Insulin Glargine (LANTUS SOLOSTAR) 100 UNIT/ML Solostar Pen Inject 20 Units into the skin daily at 10 pm. 15 mL 11  . magnesium oxide (MAG-OX) 400 (241.3 Mg) MG tablet Take 1 tablet (400 mg total) by mouth daily. 30 tablet 6  . midodrine (PROAMATINE) 2.5 MG tablet Take 1 tablet (2.5 mg total) by  mouth 3 (three) times daily with meals. 90 tablet 6  . Multiple Vitamin (MULTIVITAMIN WITH MINERALS) TABS tablet Take 1 tablet by mouth daily.    . mupirocin ointment (BACTROBAN) 2 % Apply 1 application topically to leg blisters 2 times daily or as needed 22 g 6  . ondansetron (ZOFRAN ODT) 4 MG disintegrating tablet Take 1 tablet (4 mg total) by mouth every 8 (eight) hours as needed for nausea. 6 tablet 0  . pantoprazole (PROTONIX) 40 MG tablet Take 1 tablet (40 mg total) by mouth 2 (two) times daily before a meal. 60 tablet 6  . potassium chloride SA (K-DUR,KLOR-CON) 20 MEQ tablet Take 1 tablet (20 mEq total) by mouth 2 (two) times daily. 60 tablet 3  . senna-docusate (SENOKOT S) 8.6-50 MG tablet Take 2 tablets by mouth at bedtime as needed for mild constipation. 30 tablet 6  . spironolactone (ALDACTONE) 25 MG tablet Take 0.5 tablets (12.5 mg total) by mouth daily. 15 tablet 6  . sucralfate (CARAFATE) 1 GM/10ML suspension Take 10 mLs (1 g total) by mouth 4 (four) times daily -  with meals and at bedtime. 420 mL 6  . torsemide (DEMADEX) 100 MG tablet Take 136m in the morning and 570min the evening 90 tablet 6  . traMADol (ULTRAM) 50 MG tablet Take 1 tablet (50 mg total) by mouth 3 (three) times daily as needed for severe pain. 30 tablet 0  . warfarin (COUMADIN) 5 MG tablet Take 10 mg (2 tablets) daily except 7.5 mg (1 and 1/2 tablets) on Mon/Fri.    . zolpidem (AMBIEN) 10 MG tablet Take 1 tablet (10 mg total) by mouth at bedtime as needed for sleep. 30 tablet 2   No current facility-administered medications for this encounter.     Codeine; Trazodone and nefazodone; Lipitor [atorvastatin]; and Tape  REVIEW OF SYSTEMS: All systems negative except as listed in HPI, PMH, and Problem list.    Vitals:   08/02/17 1143 08/02/17 1144  BP: (!) 92/0 91/77  Pulse: 81   SpO2: 93%     Wt Readings from Last 3 Encounters:  08/02/17 229 lb 9.6 oz (104.1 kg)  07/20/17 224 lb (101.6 kg)  07/13/17 228  lb (103.4 kg)    Vital Signs:  Doppler Pressure 92              Automatc BP: 91/77 (85) HR: 81 SPO2: 93%  Weight: 224 lb w/o eqt Last weight: 229 lb Home weights: 221-225 lbs.    Physical Exam: GENERAL: Walked into clinic. NAD.  HEENT: Normal. NECK: Supple, JVP 8-9 cm.. Carotids OK.  CARDIAC:  Mechanical heart sounds with LVAD hum present.  LUNGS:  CTAB, normal effort.  ABDOMEN:  NT, ND, no HSM. No bruits or masses. +BS  LVAD exit site: Well-healed and incorporated. Dressing dry and intact. No erythema or drainage. Stabilization device present and accurately applied. Driveline dressing changed daily per sterile technique. EXTREMITIES:  Warm and dry. No cyanosis or clubbing.  RLE healing, wraps in place, C/D/I. Trace ankle edema.  NEUROLOGIC:  Alert & oriented x 3. Cranial nerves grossly intact. Moves all 4 extremities w/o difficulty. Affect pleasant       ASSESSMENT AND PLAN: 1. Chronic end-stage biventricular systolic HF: LVEF 09% due to ICM -> s/p Echo 08/24/16 LVEF 15%, RV mild dilated, moderately reduced. 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  Has been turned down for transplant at [facility].  - Complicated by RV failure. Initially on milrinone but stopped due to low mixed venous saturation.   - Remains on dobutamine 7.5 mcg.  - Continue midodrine 2.5 TID for low MAPs and possible cirrhotic physiology.  - Volume status stable. Reinforced need for fluid/ice restriction.  - Continue torsemide 100 mg q am and 50 mg q pm.  - Continue sildenafil 40 tid.    - No beta blocker with RV failure.  - VAD parameters personally reviewed and stable.  - Warfarin with goal INR 2.0 - 2.5 + ASA 81 daily.  - INR 3.69. Discussed with Pharm-D personally. Has history of bleeding.  - Had epistaxis with lovenox, will not use  - Driveline stable. No infectious s/s.  2. CAD: Severe 3v- CAD s/p CABG with occluded grafts as above except for LIMA.  - No s/s of ischemia. Continue current therapy.  3. DMII:  - Per  PCP. - HgbA1c 6.5 4. Tobacco abuse:  - Reports complete cessation 5. Atrial fibrillation, paroxysmal - NSR on exam. Continue amio 200 bid 6. Left pleural effusion - underwent repeat thoracentesis in Jan. 2018.  - No recurrence.  7.  Anxiety/Depression - Continue Celexa 39m daily . - Social issues remain a challenge. He does much better when he is staying with his sister. No change.  8. RUE DVT.  - Continue warfarin.  9. Hyponatremia: Sodium improved at 132. Last visit. BMET pending today.  10. Anemia, iron-deficiency:   - Has had GI work up with EGD and Capsule Endoscopy showing severe gastropathy.  - Continue cararfate.  - No current bleeding despite supratherapeutic INR.  11. RLE- Partial thickness wound on the anterior aspect of RLE - Continues to heal. Continue compression wrap.   Doing well currently. INR Supratherapeutic, will discuss with Pharm D. Watch closely for bleeding. Creatinine stable. LDH 168.   RTC 4 weeks. (Has dressing change next week as well)  MShirley Friar PA-C  12:21 PM  Greater than 50% of the 35 minute visit was spent in counseling/coordination of care regarding sliding scale diuretics, salt/fluid restrictions, medication reconciliation, medication compliance, and disease state education.

## 2017-08-03 ENCOUNTER — Ambulatory Visit (HOSPITAL_COMMUNITY)
Admission: RE | Admit: 2017-08-03 | Discharge: 2017-08-03 | Disposition: A | Discharge status: Home / Self Care | Payer: Medicare HMO | Source: Ambulatory Visit | Attending: Internal Medicine | Admitting: Internal Medicine

## 2017-08-03 DIAGNOSIS — I2510 Atherosclerotic heart disease of native coronary artery without angina pectoris: Secondary | ICD-10-CM | POA: Diagnosis not present

## 2017-08-03 DIAGNOSIS — Z87891 Personal history of nicotine dependence: Secondary | ICD-10-CM | POA: Diagnosis not present

## 2017-08-03 DIAGNOSIS — I5022 Chronic systolic (congestive) heart failure: Secondary | ICD-10-CM | POA: Diagnosis not present

## 2017-08-03 DIAGNOSIS — J449 Chronic obstructive pulmonary disease, unspecified: Secondary | ICD-10-CM | POA: Diagnosis not present

## 2017-08-03 DIAGNOSIS — I110 Hypertensive heart disease with heart failure: Secondary | ICD-10-CM | POA: Diagnosis present

## 2017-08-03 DIAGNOSIS — Z95810 Presence of automatic (implantable) cardiac defibrillator: Secondary | ICD-10-CM | POA: Diagnosis not present

## 2017-08-03 DIAGNOSIS — F329 Major depressive disorder, single episode, unspecified: Secondary | ICD-10-CM | POA: Diagnosis not present

## 2017-08-03 DIAGNOSIS — E119 Type 2 diabetes mellitus without complications: Secondary | ICD-10-CM | POA: Diagnosis not present

## 2017-08-03 LAB — GLUCOSE, CAPILLARY
GLUCOSE-CAPILLARY: 104 mg/dL — AB (ref 65–99)
GLUCOSE-CAPILLARY: 81 mg/dL (ref 65–99)
GLUCOSE-CAPILLARY: 105 mg/dL — AB (ref 65–99)

## 2017-08-03 NOTE — Progress Notes (Signed)
Patient arrived in clinic today with complaints of low blood sugar and hypotension at cardiac rehab this morning. Per patient, he was directed to come to clinic by rehab staff. MAP was 88 per my doppler and blood sugar is 104. Patient has had a lingering cold for about 1 week.  Instructed to page the [place] pager to review symptoms in the future. Also instructed to call with any reports of fever, shortness of breath, or increased cough. Patient verbalized understanding and stated he is going home to rest.  Balinda Quails RN, [place] Coordinator 24/7 pager [phone]

## 2017-08-03 NOTE — Progress Notes (Signed)
Daily Session Note  Patient Details  Name: Tamel Abel. MRN: [id number] Date of Birth: 1955/11/02 Referring Provider:     Cardiac Rehab from 06/29/2017 in [facility]  Referring Provider  Glori Bickers MD      Encounter Date: 08/03/2017  Check In:     Session Check In - 08/03/17 0750      Check-In   Location [facility]   Staff Present Gerlene Burdock, RN, Levie Heritage, MA, ACSM RCEP, Exercise Physiologist;Tavarious Freel Flavia Shipper   Supervising physician immediately available to respond to emergencies See telemetry face sheet for immediately available ER MD   Medication changes reported     No   Fall or balance concerns reported    No   Warm-up and Cool-down Performed on first and last piece of equipment   Resistance Training Performed Yes   VAD Patient? Yes     VAD patient   Has back up controller? Yes   Has spare charged batteries? Yes   Has battery cables? Yes   Has compatible battery clips? Yes     Pain Assessment   Currently in Pain? No/denies         History  Smoking Status  . Former Smoker  . Packs/day: 1.50  . Years: 23.00  . Types: Cigarettes  . Start date: 10/24/1992  . Quit date: 07/01/2016  Smokeless Tobacco  . Never Used    Goals Met:  Independence with exercise equipment Exercise tolerated well No report of cardiac concerns or symptoms Strength training completed today  Goals Unmet:  Not Applicable  Comments: Pt able to follow exercise prescription today without complaint.  Will continue to monitor for progression.  [name]'s blood pressure was only in the 60s today, even during exercise.  We will send a note to Dr. Haroldine Laws as well.  Dr. Emily Filbert is Medical Director for [place] and [facility].

## 2017-08-04 DIAGNOSIS — I2510 Atherosclerotic heart disease of native coronary artery without angina pectoris: Secondary | ICD-10-CM

## 2017-08-05 ENCOUNTER — Encounter: Payer: Medicare HMO | Attending: Internal Medicine

## 2017-08-05 ENCOUNTER — Other Ambulatory Visit (HOSPITAL_COMMUNITY): Payer: Self-pay | Admitting: Unknown Physician Specialty

## 2017-08-05 DIAGNOSIS — F329 Major depressive disorder, single episode, unspecified: Secondary | ICD-10-CM | POA: Insufficient documentation

## 2017-08-05 DIAGNOSIS — Z95811 Presence of heart assist device: Secondary | ICD-10-CM

## 2017-08-05 DIAGNOSIS — J449 Chronic obstructive pulmonary disease, unspecified: Secondary | ICD-10-CM | POA: Insufficient documentation

## 2017-08-05 DIAGNOSIS — Z87891 Personal history of nicotine dependence: Secondary | ICD-10-CM | POA: Insufficient documentation

## 2017-08-05 DIAGNOSIS — Z95810 Presence of automatic (implantable) cardiac defibrillator: Secondary | ICD-10-CM | POA: Insufficient documentation

## 2017-08-05 DIAGNOSIS — Z7901 Long term (current) use of anticoagulants: Secondary | ICD-10-CM

## 2017-08-05 DIAGNOSIS — I110 Hypertensive heart disease with heart failure: Secondary | ICD-10-CM | POA: Insufficient documentation

## 2017-08-05 DIAGNOSIS — I5022 Chronic systolic (congestive) heart failure: Secondary | ICD-10-CM | POA: Insufficient documentation

## 2017-08-05 DIAGNOSIS — E119 Type 2 diabetes mellitus without complications: Secondary | ICD-10-CM | POA: Insufficient documentation

## 2017-08-05 DIAGNOSIS — I2510 Atherosclerotic heart disease of native coronary artery without angina pectoris: Secondary | ICD-10-CM | POA: Insufficient documentation

## 2017-08-07 IMAGING — DX DG CHEST 2V
2 series · 2 of 2 positions shown · non-contrast
Comparison: February 23, 2016

CLINICAL DATA: Chest pain with cough and shortness of breath for 1
day

EXAM:
CHEST  2 VIEW

[w chest pa]
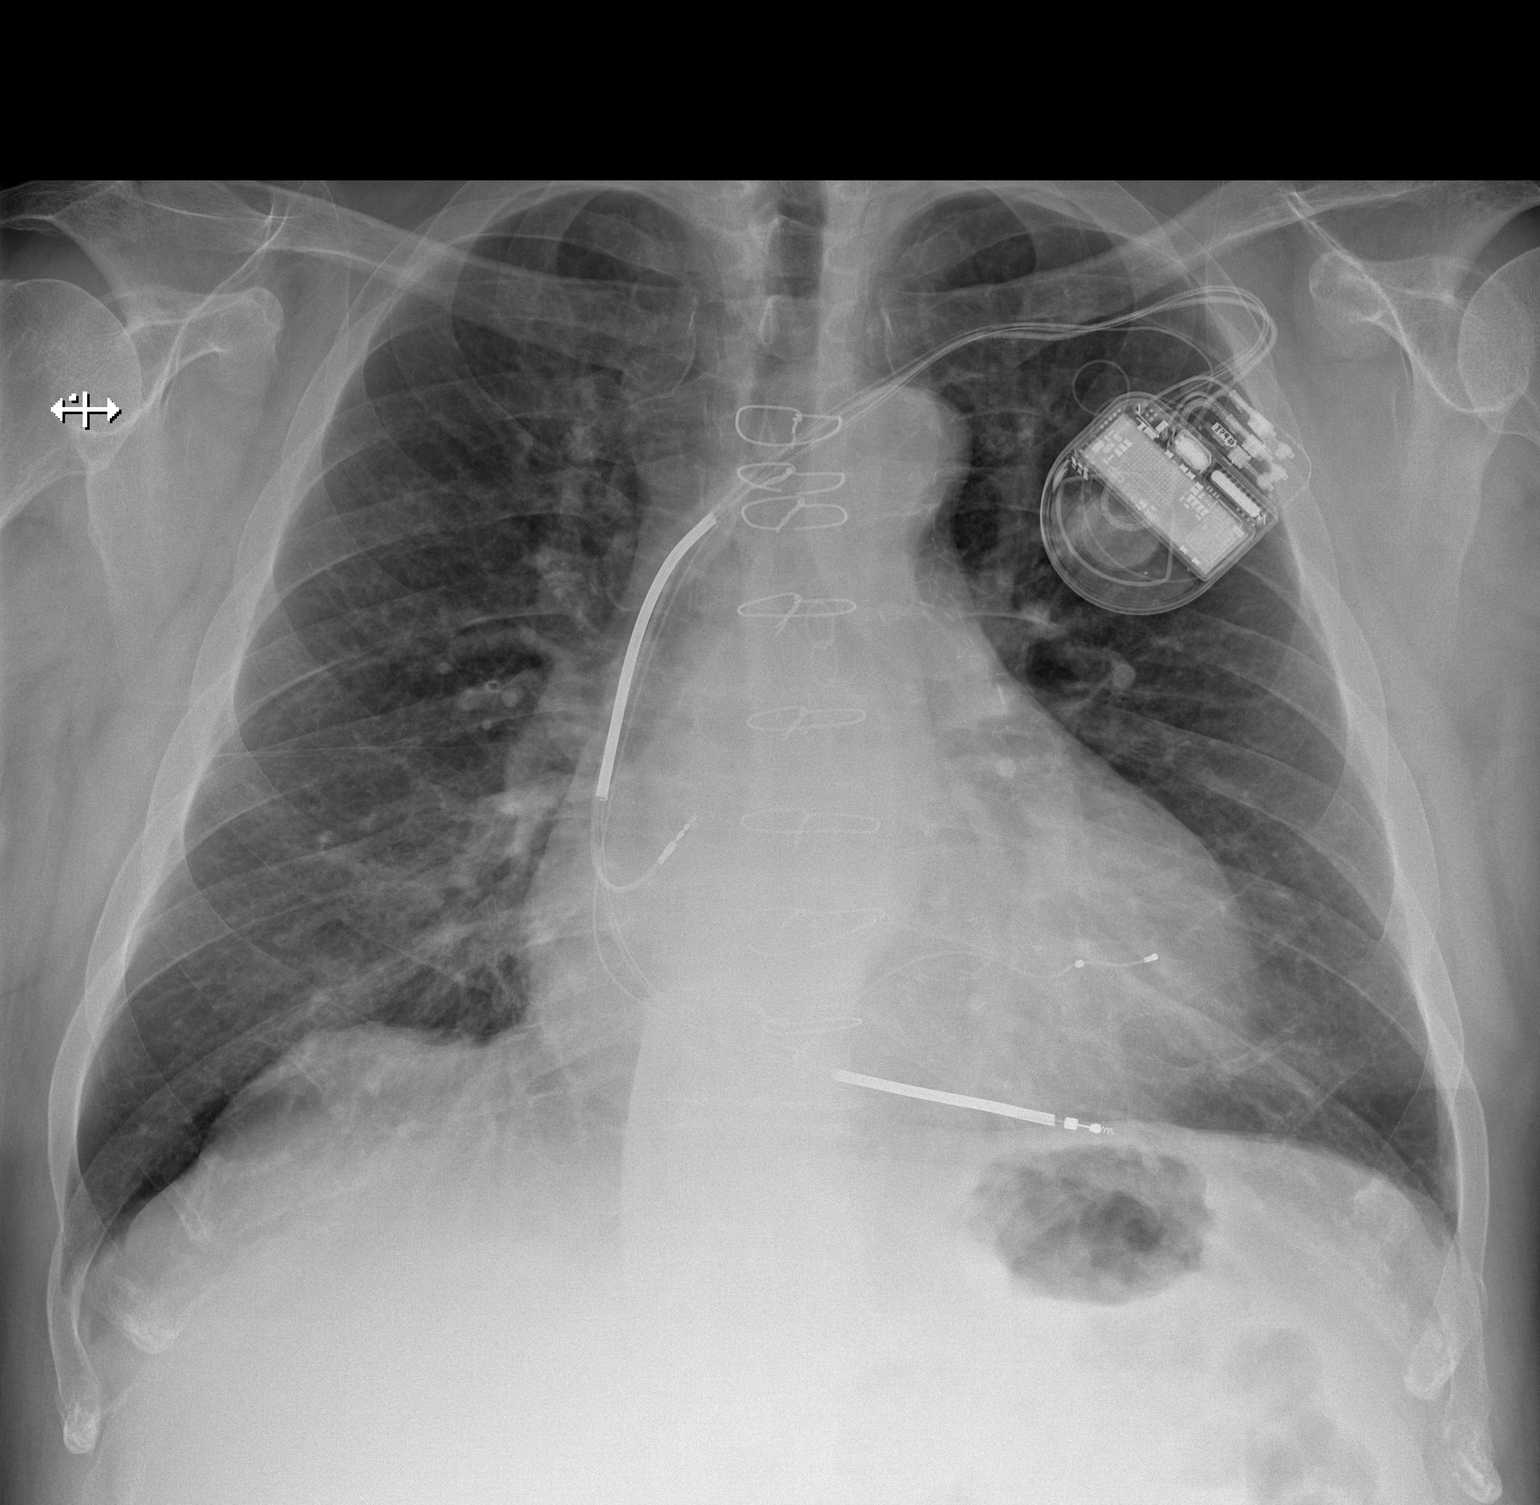

[w chest lat]
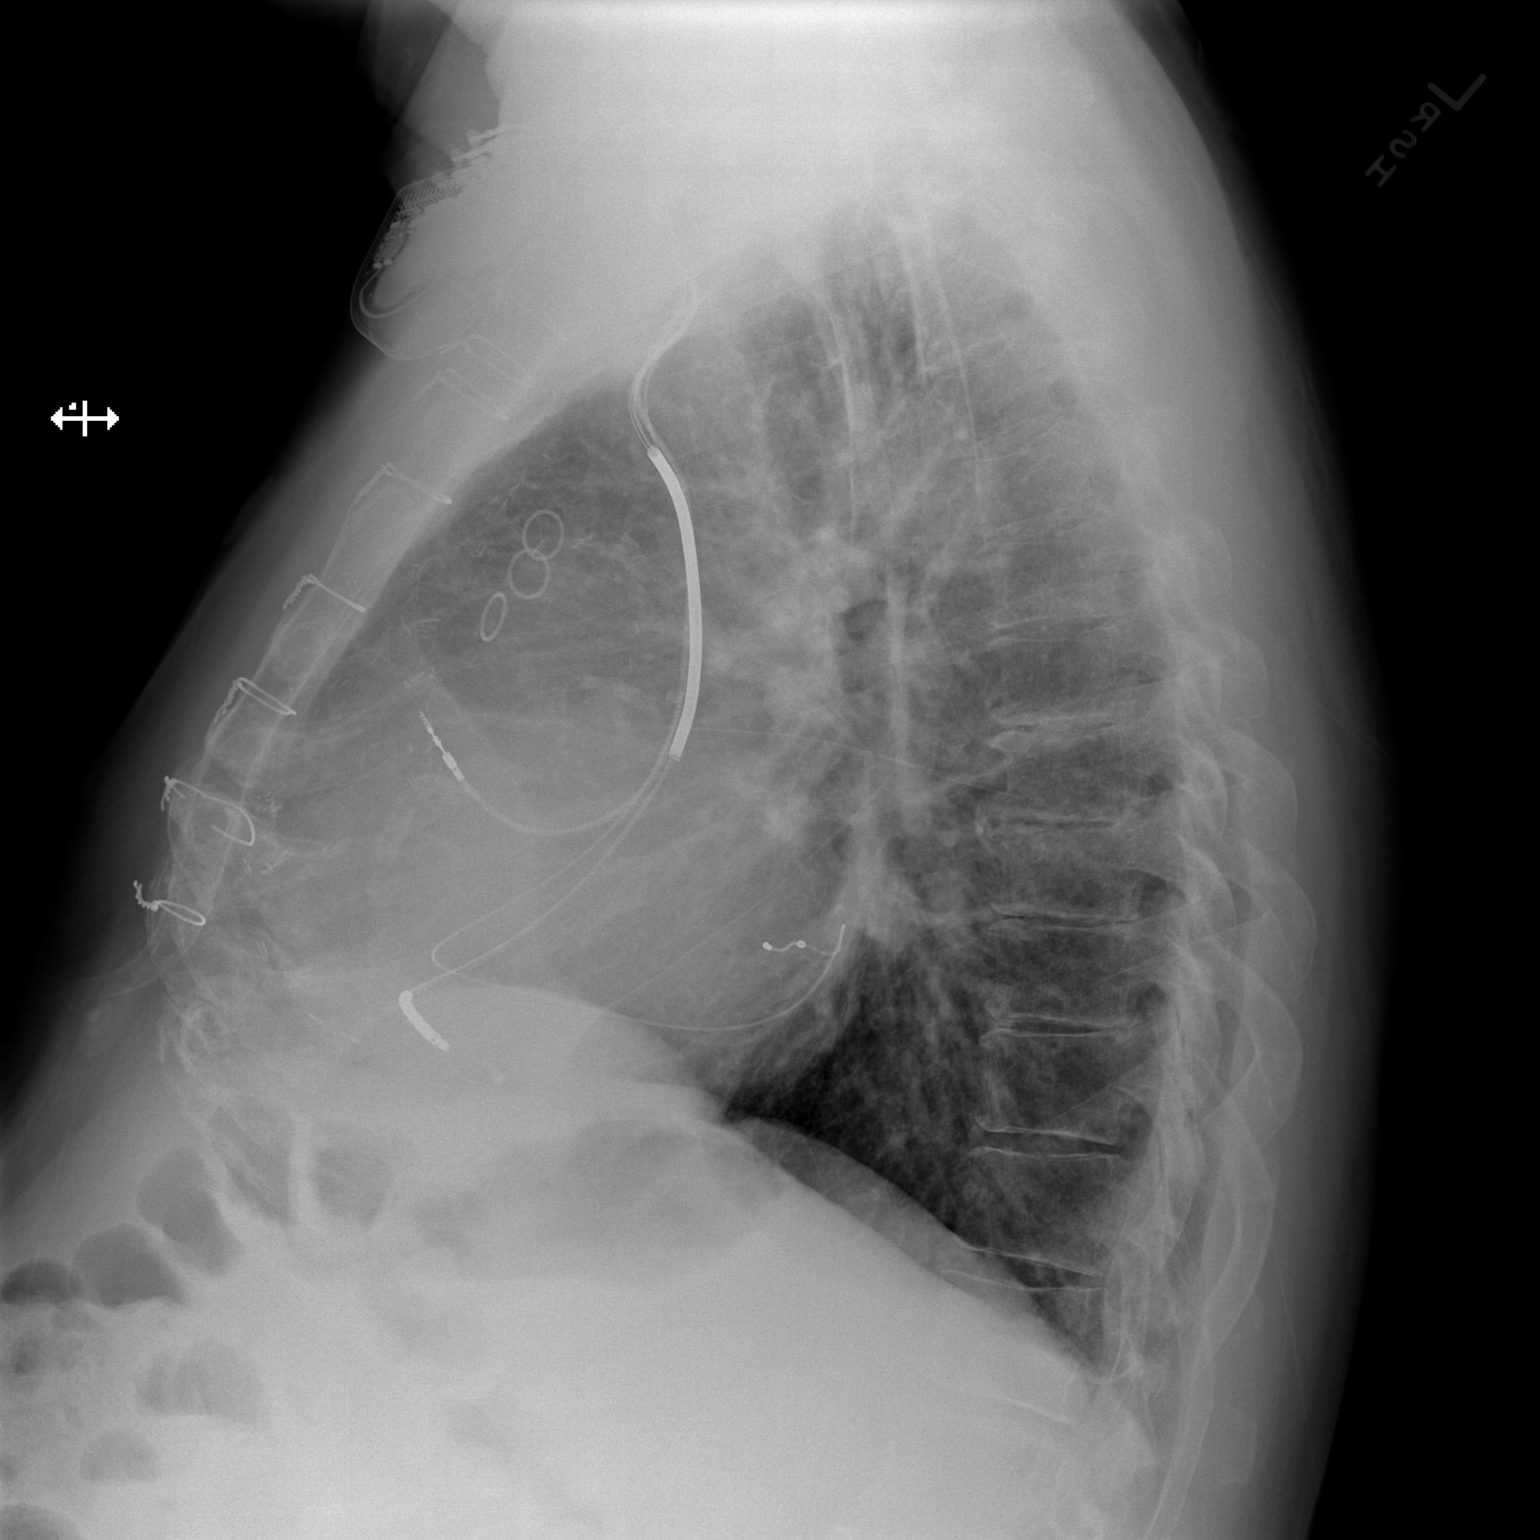

[2 of 2 positions shown; findings below may reference images not displayed]

FINDINGS: There is no appreciable edema or consolidation. Heart is mildly
enlarged, stable. Pulmonary vascularity is normal. Pacemaker leads
are attached to the right atrium, right ventricle, and left
ventricle. Patient is status post coronary artery bypass grafting.
No adenopathy. There is degenerative change in the mid thoracic
spine.
IMPRESSION: Stable cardiomegaly. Pacemaker leads unchanged. No edema or
consolidation.

## 2017-08-07 IMAGING — CT CT ABD-PELV W/ CM
2 of 5 series · 16 of 46 positions shown, 18 images · IV contrast (Omni 300)
Comparison: Most recent CT 08/29/2015

CLINICAL DATA: Acute right lower quadrant pain.  Vomiting.

EXAM:
CT ABDOMEN AND PELVIS WITH CONTRAST
TECHNIQUE: Multidetector CT imaging of the abdomen and pelvis was performed
using the standard protocol following bolus administration of
intravenous contrast.
CONTRAST:  100mL 3NJP0D-COO IOPAMIDOL (3NJP0D-COO) INJECTION 61%

[Series 2: a/p w/ 5mm · axial · 0.83mm/px · z∈[-752,-307]mm · 13 of 101 slices shown, 15 images]
[im 6/101  soft-tissue]
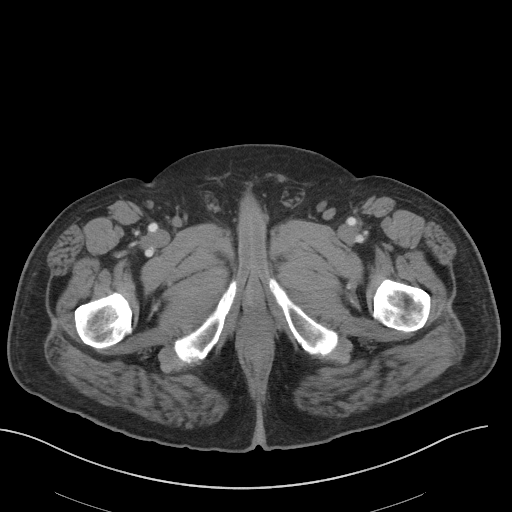
[im 6/101  bone]
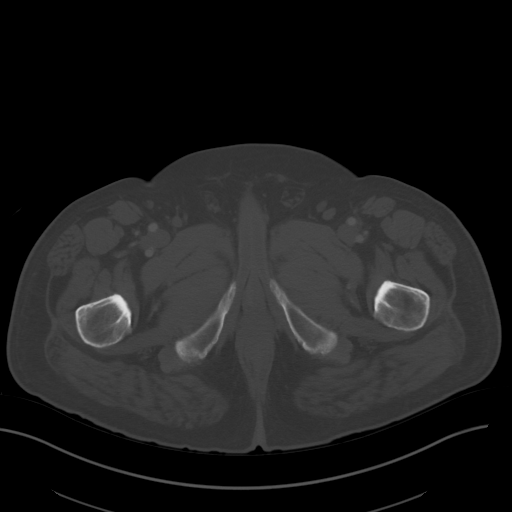
[im 16/101  soft-tissue]
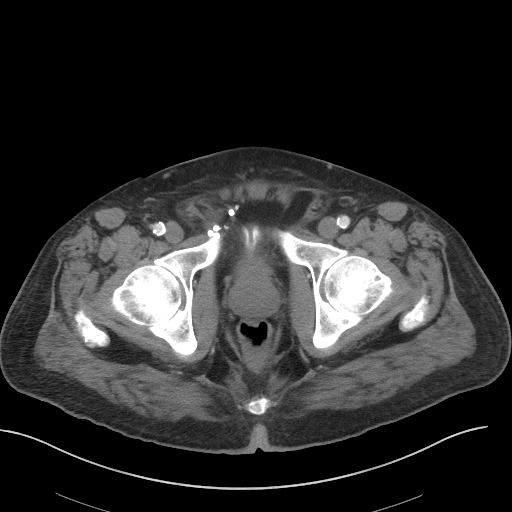
[im 22/101  soft-tissue]
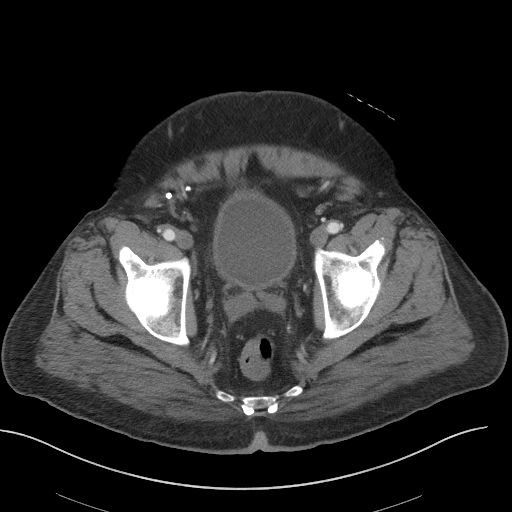
[im 27/101  soft-tissue]
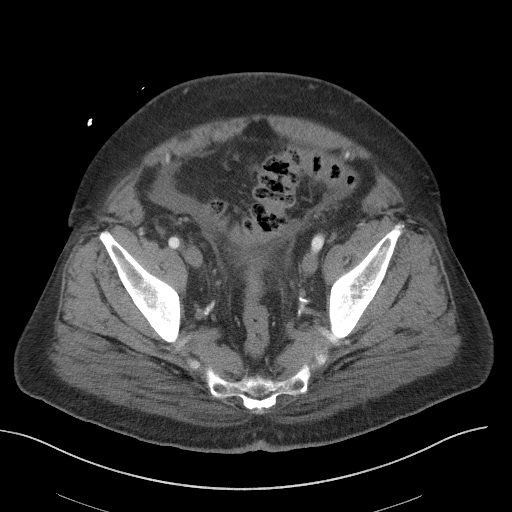
[im 37/101  soft-tissue]
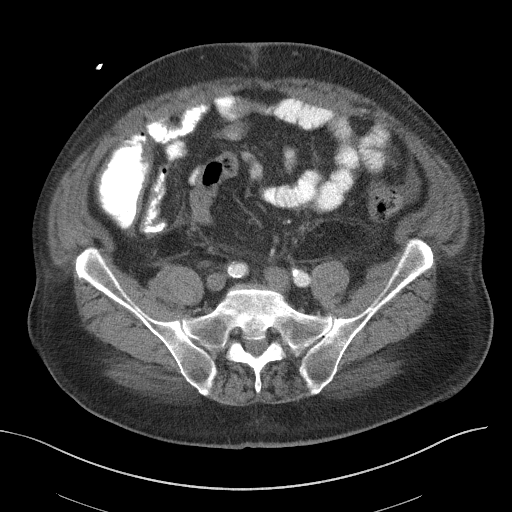
[im 43/101  soft-tissue]
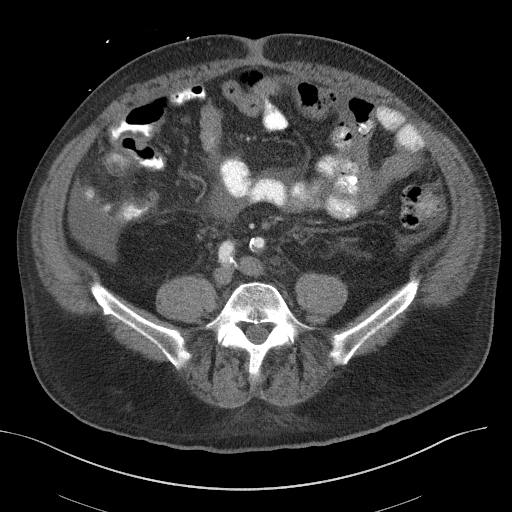
[im 53/101  soft-tissue]
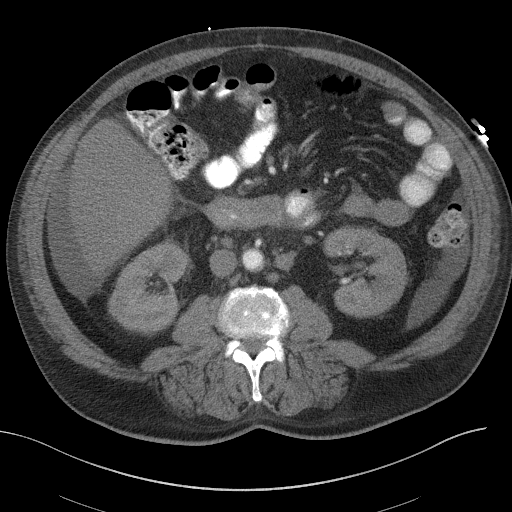
[im 58/101  soft-tissue]
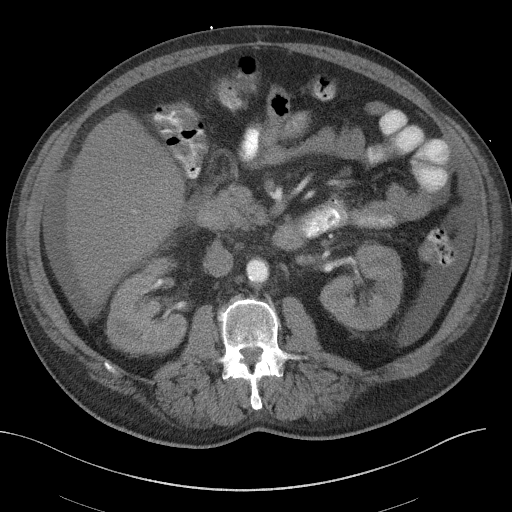
[im 64/101  soft-tissue]
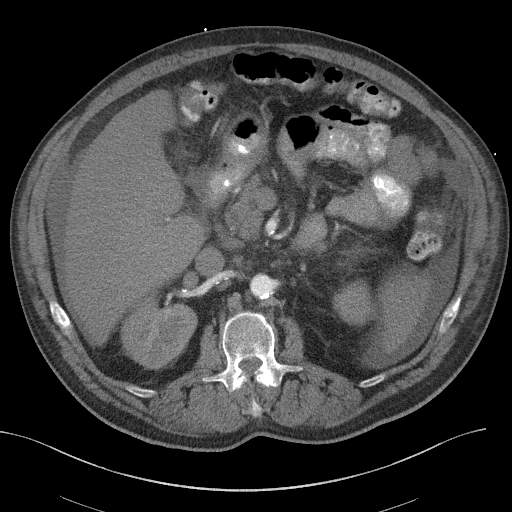
[im 64/101  bone]
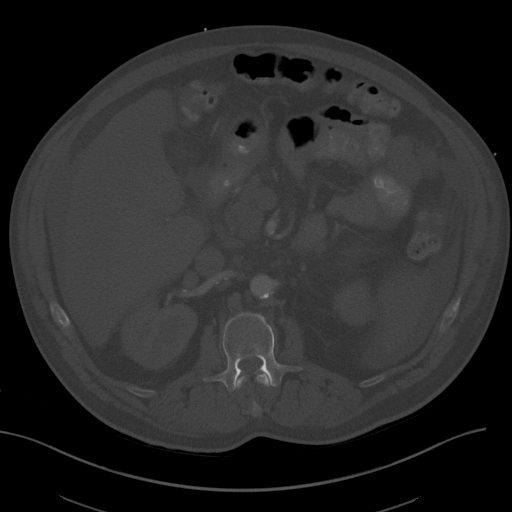
[im 74/101  soft-tissue]
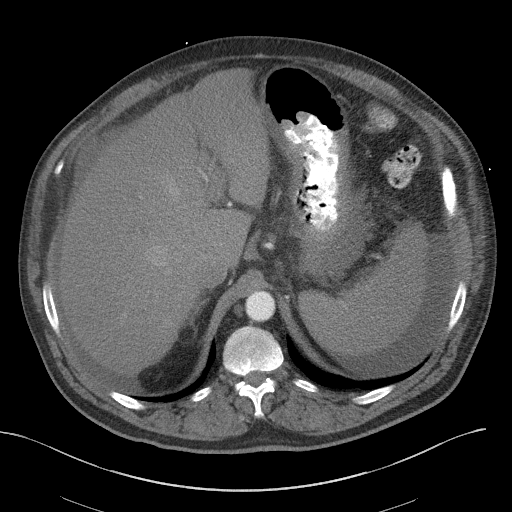
[im 79/101  soft-tissue]
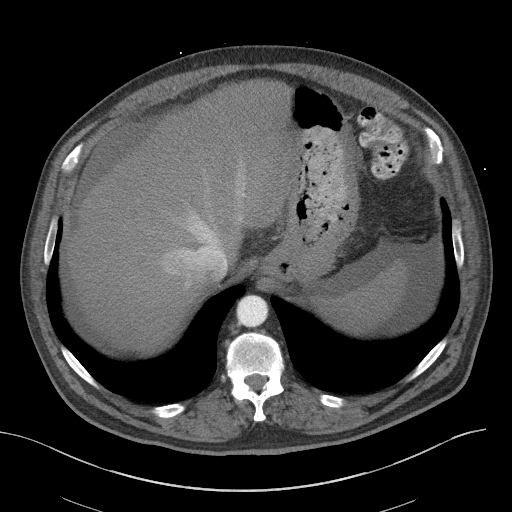
[im 85/101  soft-tissue]
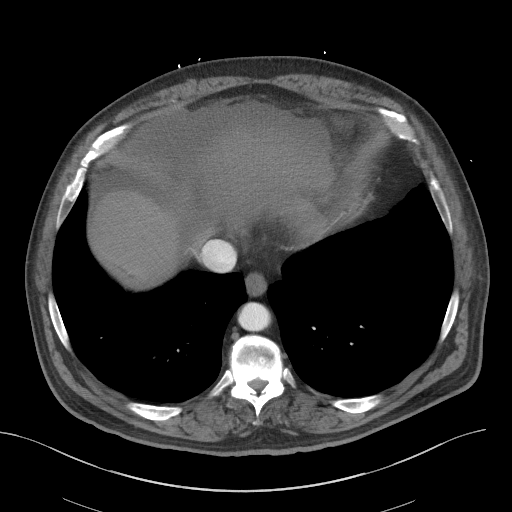
[im 95/101  soft-tissue]
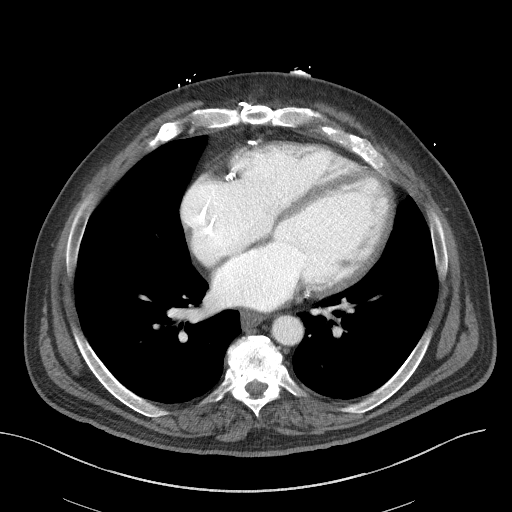

[Series 5: a/p w/ cor · coronal · 0.88mm/px · 3 of 171 slices shown]
[im 57/171  soft-tissue]
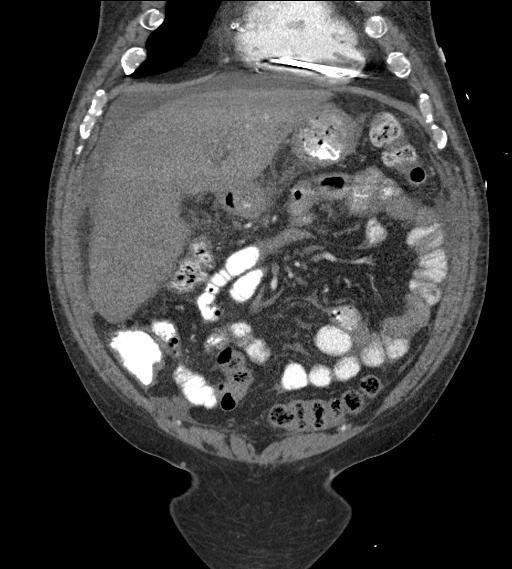
[im 76/171  soft-tissue]
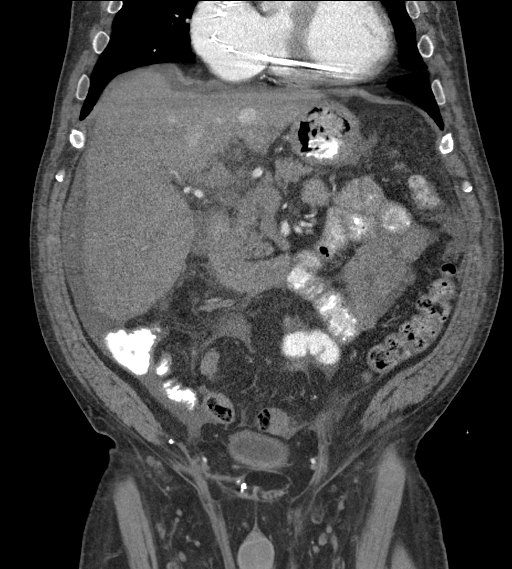
[im 95/171  soft-tissue]
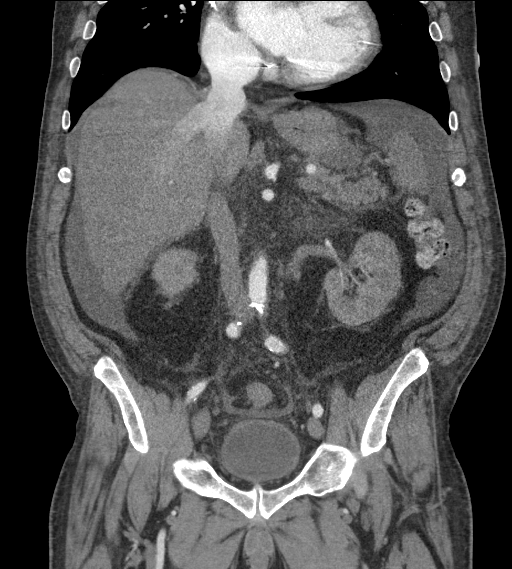

[16 of 46 positions shown; findings below may reference images not displayed]

FINDINGS: Lower chest: Multi chamber cardiomegaly. There is contrast refluxing
into the IVC and hepatic veins. No pleural effusion. Lung bases are
clear.

Liver: Lobular hepatic contours. Liver appears prominent in size. No
discrete focal lesion.

Hepatobiliary: Clips in the gallbladder fossa postcholecystectomy.
No biliary dilatation.

Pancreas: No ductal dilatation or inflammation.

Spleen: Upper normal in size measuring 13.1 x 5.4 x 12.6 cm (volume
= 463.5 cc).

Adrenal glands: Probable myelolipoma on the right adrenal gland.
Left adrenal gland is normal.

Kidneys: Symmetric renal enhancement. No hydronephrosis. No
excretion on delayed phase imaging bilaterally.

Stomach/Bowel: Stomach physiologically distended. There are no
dilated or thickened small bowel loops. Small volume of stool
throughout the colon without colonic wall thickening. Mild distal
colonic diverticulosis without acute diverticulitis. The appendix is
normal.

Vascular/Lymphatic: No retroperitoneal adenopathy. Prominence celiac
axis node measuring 1.6 cm is unchanged. Abdominal aorta is normal
in caliber. Mild atherosclerosis. Replaced right hepatic artery.
There is a retro aortic left renal vein.

Reproductive: Normal sized prostate gland.

Bladder: Minimally distended. Mild wall thickening may be secondary
to degree of distension.

Other: Small to moderate intra-abdominal ascites, most prominent in
the right upper quadrant. Mild mesenteric edema. No loculated fluid
collection/abscess. Post right inguinal hernia repair. Minimal whole
body wall edema.

Musculoskeletal: There are no acute or suspicious osseous
abnormalities. Degenerative change in the spine.
IMPRESSION: 1. Equivocal urinary bladder wall thickening, may be due to degree
of distension, however recommend correlation with urinalysis to
exclude urinary tract infection.
2. Small to moderate volume intra-abdominal ascites.
3. Normal appendix.
4. Lack of renal excretion on delayed phase imaging, can be seen
with underlying renal dysfunction. Recommend continued clinical
follow-up.

## 2017-08-09 ENCOUNTER — Other Ambulatory Visit (HOSPITAL_COMMUNITY): Payer: Self-pay

## 2017-08-10 ENCOUNTER — Other Ambulatory Visit (HOSPITAL_COMMUNITY): Payer: Self-pay | Admitting: *Deleted

## 2017-08-10 ENCOUNTER — Encounter: Payer: Self-pay | Admitting: *Deleted

## 2017-08-10 ENCOUNTER — Telehealth: Payer: Self-pay | Admitting: *Deleted

## 2017-08-10 ENCOUNTER — Other Ambulatory Visit (HOSPITAL_COMMUNITY): Payer: Self-pay

## 2017-08-10 DIAGNOSIS — Z95811 Presence of heart assist device: Secondary | ICD-10-CM

## 2017-08-10 DIAGNOSIS — I5022 Chronic systolic (congestive) heart failure: Secondary | ICD-10-CM

## 2017-08-10 NOTE — Telephone Encounter (Signed)
CAlled to let us know his car is still in the shop and he hopes to return to program later this week

## 2017-08-10 NOTE — Progress Notes (Signed)
Cardiac Individual Treatment Plan  Patient Details  Name: Ronald Miller. MRN: [id number] Date of Birth: 04-24-1956 Referring Provider:     Cardiac Rehab from 06/29/2017 in [facility] Cardiac and Pulmonary Rehab  Referring Provider  Glori Bickers MD      Initial Encounter Date:    Cardiac Rehab from 06/29/2017 in [facility] Cardiac and Pulmonary Rehab  Date  06/29/17  Referring Provider  Glori Bickers MD      Visit Diagnosis: Heart failure, chronic systolic ([place])  LVAD (left ventricular assist device) present [facility])  Patient's Home Medications on Admission:  Current Outpatient Medications:  .  acetaminophen (TYLENOL) 500 MG tablet, Take 1,000 mg by mouth every 6 (six) hours as needed for moderate pain or headache., Disp: , Rfl:  .  amiodarone (PACERONE) 200 MG tablet, Take 1 tablet (200 mg total) by mouth daily., Disp: 30 tablet, Rfl: 6 .  blood glucose meter kit and supplies KIT, Dispense based on patient and insurance preference. Use up to four times daily as directed. (FOR ICD-9 250.00, 250.01). Please include strips and lancets., Disp: 1 each, Rfl: 0 .  citalopram (CELEXA) 20 MG tablet, Take 1 tablet (20 mg total) by mouth daily., Disp: 30 tablet, Rfl: 6 .  digoxin (DIGOX) 0.125 MG tablet, Take 1 tablet (0.125 mg total) by mouth daily., Disp: 30 tablet, Rfl: 6 .  DOBUTamine (DOBUTREX) 4-5 MG/ML-% infusion, Inject 571.8 mcg/min into the vein continuous. PER [facility] pharmacy, Disp: , Rfl:  .  docusate sodium (COLACE) 100 MG capsule, Take 200 mg by mouth at bedtime. , Disp: , Rfl:  .  gabapentin (NEURONTIN) 100 MG capsule, Take 3 capsules (300 mg total) by mouth 2 (two) times daily., Disp: 120 capsule, Rfl: 3 .  insulin aspart (NOVOLOG FLEXPEN) 100 UNIT/ML FlexPen, Inject 5 Units into the skin 3 (three) times daily with meals., Disp: 15 mL, Rfl: 11 .  Insulin Glargine (LANTUS SOLOSTAR) 100 UNIT/ML Solostar Pen, Inject 20 Units into the skin daily at 10 pm., Disp: 15 mL, Rfl: 11 .   magnesium oxide (MAG-OX) 400 (241.3 Mg) MG tablet, Take 1 tablet (400 mg total) by mouth daily., Disp: 30 tablet, Rfl: 6 .  midodrine (PROAMATINE) 2.5 MG tablet, Take 1 tablet (2.5 mg total) by mouth 3 (three) times daily with meals., Disp: 90 tablet, Rfl: 6 .  Multiple Vitamin (MULTIVITAMIN WITH MINERALS) TABS tablet, Take 1 tablet by mouth daily., Disp: , Rfl:  .  mupirocin ointment (BACTROBAN) 2 %, Apply 1 application topically to leg blisters 2 times daily or as needed, Disp: 22 g, Rfl: 6 .  ondansetron (ZOFRAN ODT) 4 MG disintegrating tablet, Take 1 tablet (4 mg total) by mouth every 8 (eight) hours as needed for nausea., Disp: 6 tablet, Rfl: 0 .  pantoprazole (PROTONIX) 40 MG tablet, Take 1 tablet (40 mg total) by mouth 2 (two) times daily before a meal., Disp: 60 tablet, Rfl: 6 .  potassium chloride SA (K-DUR,KLOR-CON) 20 MEQ tablet, Take 1 tablet (20 mEq total) by mouth 2 (two) times daily., Disp: 60 tablet, Rfl: 3 .  senna-docusate (SENOKOT S) 8.6-50 MG tablet, Take 2 tablets by mouth at bedtime as needed for mild constipation., Disp: 30 tablet, Rfl: 6 .  spironolactone (ALDACTONE) 25 MG tablet, Take 0.5 tablets (12.5 mg total) by mouth daily., Disp: 15 tablet, Rfl: 6 .  sucralfate (CARAFATE) 1 GM/10ML suspension, Take 10 mLs (1 g total) by mouth 4 (four) times daily -  with meals and at bedtime., Disp:  420 mL, Rfl: 6 .  torsemide (DEMADEX) 100 MG tablet, Take 128m in the morning and 532min the evening, Disp: 90 tablet, Rfl: 6 .  traMADol (ULTRAM) 50 MG tablet, Take 1 tablet (50 mg total) by mouth 3 (three) times daily as needed for severe pain., Disp: 30 tablet, Rfl: 0 .  warfarin (COUMADIN) 5 MG tablet, Take 10 mg (2 tablets) daily except 7.5 mg (1 and 1/2 tablets) on Mon/Fri., Disp: , Rfl:  .  zolpidem (AMBIEN) 10 MG tablet, Take 1 tablet (10 mg total) by mouth at bedtime as needed for sleep., Disp: 30 tablet, Rfl: 2  Past Medical History: Past Medical History:  Diagnosis Date  . AICD  (automatic cardioverter/defibrillator) present   . ASCVD (arteriosclerotic cardiovascular disease)   . [place]  . [place]  . Coronary artery disease   . Depression   . Diabetes mellitus   . History of cocaine abuse   . Hypertension   . Presence of permanent cardiac pacemaker   . Shortness of breath dyspnea   . Suicidal ideation   . Tobacco abuse     Tobacco Use: Social History   Tobacco Use  Smoking Status Former Smoker  . Packs/day: 1.50  . Years: 23.00  . Pack years: 34.50  . Types: Cigarettes  . Start date: 10/24/1992  . Last attempt to quit: 07/01/2016  . Years since quitting: 1.1  Smokeless Tobacco Never Used    Labs: Recent Review Flowsheet Data    Labs for ITP Cardiac and Pulmonary Rehab Latest Ref Rng & Units 06/12/2017 06/13/2017 06/14/2017 06/15/2017 07/13/2017   Cholestrol 0 - 200 mg/dL - - - - -   LDLCALC 0 - 100 mg/dL - - - - -   HDL 40 - 60 mg/dL - - - - -   Trlycerides 0 - 200 mg/dL - - - - -   Hemoglobin A1c 4.8 - 5.6 % - - - - 6.5(H)   PHART 7.350 - 7.450 - - - - -   PCO2ART 32.0 - 48.0 mmHg - - - - -   HCO3 20.0 - 28.0 mmol/L - - - - -   TCO2 0 - 100 mmol/L - - - - -   ACIDBASEDEF 0.0 - 2.0 mmol/L - - - - -   O2SAT % 55.8 68.4 64.3 54.9 -       Exercise Target Goals:    Exercise Program Goal: Individual exercise prescription set with THRR, safety & activity barriers. Participant demonstrates ability to understand and report RPE using BORG scale, to self-measure pulse accurately, and to acknowledge the importance of the exercise prescription.  Exercise Prescription Goal: Starting with aerobic activity 30 plus minutes a day, 3 days per week for initial exercise prescription. Provide home exercise prescription and guidelines that participant acknowledges understanding prior to discharge.  Activity Barriers & Risk Stratification: Activity Barriers & Cardiac Risk  Stratification - 06/29/17 0936      Activity Barriers & Cardiac Risk Stratification   Activity Barriers  History of Falls;Balance Concerns;Assistive Device;Deconditioning;Muscular Weakness;Shortness of Breath;Decreased Ventricular Function;Back Problems    Cardiac Risk Stratification  High       6 Minute Walk: 6 Minute Walk    Row Name 06/29/17 0930         6 Minute Walk   Phase  Initial     Distance  635 feet     Walk Time  4.48  minutes     # of Rest Breaks  1 1:31     MPH  1.61     METS  1.44     RPE  13     Perceived Dyspnea   3     VO2 Peak  5.04     Symptoms  Yes (comment)     Comments  SOB, chest soreness from walking (Chronic)     Resting HR  59 bpm     Resting BP  - 84 dopplar     Resting Oxygen Saturation   94 %     Exercise Oxygen Saturation  during 6 min walk  93 %     Max Ex. HR  61 bpm     Max Ex. BP  - 90 dopplar       Interval Oxygen   Interval Oxygen?  Yes     Baseline Oxygen Saturation %  94 %     1 Minute Oxygen Saturation %  95 %     1 Minute Liters of Oxygen  0 L room air     2 Minute Oxygen Saturation %  93 %     2 Minute Liters of Oxygen  0 L     3 Minute Oxygen Saturation %  96 %     3 Minute Liters of Oxygen  0 L     4 Minute Oxygen Saturation %  96 %     4 Minute Liters of Oxygen  0 L     5 Minute Oxygen Saturation %  94 %     5 Minute Liters of Oxygen  0 L     6 Minute Oxygen Saturation %  94 %     6 Minute Liters of Oxygen  0 L     2 Minute Post Oxygen Saturation %  97 %     2 Minute Post Liters of Oxygen  0 L        Oxygen Initial Assessment:   Oxygen Re-Evaluation:   Oxygen Discharge (Final Oxygen Re-Evaluation):   Initial Exercise Prescription: Initial Exercise Prescription - 06/29/17 0900      Date of Initial Exercise RX and Referring Provider   Date  06/29/17    Referring Provider  Glori Bickers MD      Treadmill   MPH  1    Grade  0    Minutes  15    METs  1.77      NuStep   Level  1    SPM  80     Minutes  15    METs  1.4      Biostep-RELP   Level  1    SPM  50    Minutes  15    METs  2      Prescription Details   Frequency (times per week)  3    Duration  Progress to 45 minutes of aerobic exercise without signs/symptoms of physical distress      Intensity   THRR 40-80% of Max Heartrate  99-140    Ratings of Perceived Exertion  11-13    Perceived Dyspnea  0-4      Progression   Progression  Continue to progress workloads to maintain intensity without signs/symptoms of physical distress.      Resistance Training   Training Prescription  Yes    Weight  2 lbs    Reps  10-15       Perform Capillary Blood  Glucose checks as needed.  Exercise Prescription Changes: Exercise Prescription Changes    Row Name 06/29/17 0900 08/03/17 1500           Response to Exercise   Blood Pressure (Admit)  - 84 dopplar  - 76 dopplar      Blood Pressure (Exercise)  - 90 dopplar  - 68 dopplar      Blood Pressure (Exit)  - 68 dopplar  - 66 dopplar      Heart Rate (Admit)  59 bpm  68 bpm      Heart Rate (Exercise)  61 bpm  93 bpm      Heart Rate (Exit)  -  62 bpm      Oxygen Saturation (Admit)  94 %  -      Oxygen Saturation (Exercise)  93 %  -      Oxygen Saturation (Exit)  97 %  -      Rating of Perceived Exertion (Exercise)  13  13      Perceived Dyspnea (Exercise)  3  -      Symptoms  SOB, chest soreness  SOB, fatigue      Comments  walk test results and first full day of exercise  Second full day of exercise      Duration  -  Progress to 45 minutes of aerobic exercise without signs/symptoms of physical distress      Intensity  -  THRR unchanged        Progression   Progression  -  Continue to progress workloads to maintain intensity without signs/symptoms of physical distress.      Average METs  -  2.4        Resistance Training   Training Prescription  -  Yes      Weight  -  2 lbs      Reps  -  10-15        Interval Training   Interval Training  -  No        NuStep    Level  -  1      Minutes  -  15      METs  -  2.4         Exercise Comments: Exercise Comments    Row Name 06/29/17 0751 08/03/17 0853         Exercise Comments  First full day of exercise!  Patient was oriented to gym and equipment including functions, settings, policies, and procedures.  Patient's individual exercise prescription and treatment plan were reviewed.  All starting workloads were established based on the results of the 6 minute walk test done at initial orientation visit.  The plan for exercise progression was also introduced and progression will be customized based on patient's performance and goals.  Ronald Miller's blood pressure was only in the 60s today, even during exercise.  We will send a note to Dr. Haroldine Laws as well.         Exercise Goals and Review: Exercise Goals    Row Name 06/29/17 0751             Exercise Goals   Increase Physical Activity  Yes       Intervention  Provide advice, education, support and counseling about physical activity/exercise needs.;Develop an individualized exercise prescription for aerobic and resistive training based on initial evaluation findings, risk stratification, comorbidities and participant's personal goals.       Expected Outcomes  Achievement of increased cardiorespiratory fitness  and enhanced flexibility, muscular endurance and strength shown through measurements of functional capacity and personal statement of participant.       Increase Strength and Stamina  Yes       Intervention  Provide advice, education, support and counseling about physical activity/exercise needs.;Develop an individualized exercise prescription for aerobic and resistive training based on initial evaluation findings, risk stratification, comorbidities and participant's personal goals.       Expected Outcomes  Achievement of increased cardiorespiratory fitness and enhanced flexibility, muscular endurance and strength shown through measurements of functional  capacity and personal statement of participant.       Able to understand and use rate of perceived exertion (RPE) scale  Yes       Intervention  Provide education and explanation on how to use RPE scale       Expected Outcomes  Short Term: Able to use RPE daily in rehab to express subjective intensity level;Long Term:  Able to use RPE to guide intensity level when exercising independently       Able to understand and use Dyspnea scale  Yes       Intervention  Provide education and explanation on how to use Dyspnea scale       Expected Outcomes  Short Term: Able to use Dyspnea scale daily in rehab to express subjective sense of shortness of breath during exertion;Long Term: Able to use Dyspnea scale to guide intensity level when exercising independently       Knowledge and understanding of Target Heart Rate Range (THRR)  Yes       Intervention  Provide education and explanation of THRR including how the numbers were predicted and where they are located for reference       Expected Outcomes  Short Term: Able to state/look up THRR;Long Term: Able to use THRR to govern intensity when exercising independently;Short Term: Able to use daily as guideline for intensity in rehab       Able to check pulse independently  - patient has an LVAD       Understanding of Exercise Prescription  Yes       Intervention  Provide education, explanation, and written materials on patient's individual exercise prescription       Expected Outcomes  Short Term: Able to explain program exercise prescription;Long Term: Able to explain home exercise prescription to exercise independently          Exercise Goals Re-Evaluation : Exercise Goals Re-Evaluation    Row Name 08/03/17 0943             Exercise Goal Re-Evaluation   Exercise Goals Review  Increase Physical Activity;Increase Strength and Stamina;Understanding of Exercise Prescription       Comments  Ronald Miller returned to exercise today. He continues to not feel very  well.  He was able to exercise but did have some low blood pressures.  We will continue to work with him on progression.        Expected Outcomes  Short: Come to exercise regularly.  Long: Increase physicial activity.          Discharge Exercise Prescription (Final Exercise Prescription Changes): Exercise Prescription Changes - 08/03/17 1500      Response to Exercise   Blood Pressure (Admit)  -- 76 dopplar   76 dopplar   Blood Pressure (Exercise)  -- 68 dopplar   68 dopplar   Blood Pressure (Exit)  -- 66 dopplar   66 dopplar   Heart Rate (Admit)  68  bpm    Heart Rate (Exercise)  93 bpm    Heart Rate (Exit)  62 bpm    Rating of Perceived Exertion (Exercise)  13    Symptoms  SOB, fatigue    Comments  Second full day of exercise    Duration  Progress to 45 minutes of aerobic exercise without signs/symptoms of physical distress    Intensity  THRR unchanged      Progression   Progression  Continue to progress workloads to maintain intensity without signs/symptoms of physical distress.    Average METs  2.4      Resistance Training   Training Prescription  Yes    Weight  2 lbs    Reps  10-15      Interval Training   Interval Training  No      NuStep   Level  1    Minutes  15    METs  2.4       Nutrition:  Target Goals: Understanding of nutrition guidelines, daily intake of sodium <1559m, cholesterol <2018m calories 30% from fat and 7% or less from saturated fats, daily to have 5 or more servings of fruits and vegetables.  Biometrics: Pre Biometrics - 06/29/17 0941      Pre Biometrics   Height  5' 9.75" (1.772 m)    Weight  231 lb (104.8 kg)    Waist Circumference  45 inches    Hip Circumference  43.5 inches    Waist to Hip Ratio  1.03 %    BMI (Calculated)  33.37    Single Leg Stand  12.89 seconds        Nutrition Therapy Plan and Nutrition Goals: Nutrition Therapy & Goals - 08/03/17 0947      Nutrition Therapy   RD appointment defered  Yes        Nutrition Discharge: Rate Your Plate Scores: Nutrition Assessments - 06/29/17 0946      MEDFICTS Scores   Pre Score  6       Nutrition Goals Re-Evaluation: Nutrition Goals Re-Evaluation    Row Name 08/03/17 0947             Goals   Nutrition Goal  Heart Healthy Diet       Comment  Continue to work on heart healthy diet.  Follows diet set out by LVAD clinic.       Expected Outcome  Short: Continue to work on diet and watch salt.  Long: Continue to stay on healthy diet.           Nutrition Goals Discharge (Final Nutrition Goals Re-Evaluation): Nutrition Goals Re-Evaluation - 08/03/17 0947      Goals   Nutrition Goal  Heart Healthy Diet    Comment  Continue to work on heart healthy diet.  Follows diet set out by LVAD clinic.    Expected Outcome  Short: Continue to work on diet and watch salt.  Long: Continue to stay on healthy diet.        Psychosocial: Target Goals: Acknowledge presence or absence of significant depression and/or stress, maximize coping skills, provide positive support system. Participant is able to verbalize types and ability to use techniques and skills needed for reducing stress and depression.   Initial Review & Psychosocial Screening: Initial Psych Review & Screening - 06/29/17 0946      Initial Review   Current issues with  Current Depression;History of Depression;Current Anxiety/Panic;Current Psychotropic Meds;Current Stress Concerns    Source of Stress Concerns  Chronic Illness;Poor Coping Skills;Family;Unable to participate in former interests or hobbies;Unable to perform yard/household activities;Financial;Transportation;Retirement/disability    Comments  Ronald Miller has a lot going on.  Has limited support system that have been in and out of life.       Family Dynamics   Good Support System?  Yes      Barriers   Psychosocial barriers to participate in program  The patient should benefit from training in stress management and  relaxation.;Psychosocial barriers identified (see note)      Screening Interventions   Interventions  Yes;Encouraged to exercise;Program counselor consult;Provide feedback about the scores to participant;To provide support and resources with identified psychosocial needs    Expected Outcomes  Short Term goal: Utilizing psychosocial counselor, staff and physician to assist with identification of specific Stressors or current issues interfering with healing process. Setting desired goal for each stressor or current issue identified.;Long Term Goal: Stressors or current issues are controlled or eliminated.;Short Term goal: Identification and review with participant of any Quality of Life or Depression concerns found by scoring the questionnaire.;Long Term goal: The participant improves quality of Life and PHQ9 Scores as seen by post scores and/or verbalization of changes       Quality of Life Scores:  Quality of Life - 06/29/17 0948      Quality of Life Scores   Health/Function Pre  18.68 %    Socioeconomic Pre  20.14 %    Psych/Spiritual Pre  24 %    Family Pre  22.25 %    GLOBAL Pre  20.61 %       PHQ-9: Recent Review Flowsheet Data    Depression screen [facility] 2/9 06/29/2017 11/23/2016   Decreased Interest 0 1   Down, Depressed, Hopeless 0 1   PHQ - 2 Score 0 2   Altered sleeping 2 2   Tired, decreased energy 2 1   Change in appetite 0 0   Feeling bad or failure about yourself  0 2   Trouble concentrating 0 2   Moving slowly or fidgety/restless 0 1   Suicidal thoughts 0 0   PHQ-9 Score 4 10   Difficult doing work/chores Somewhat difficult Not difficult at all     Interpretation of Total Score  Total Score Depression Severity:  1-4 = Minimal depression, 5-9 = Mild depression, 10-14 = Moderate depression, 15-19 = Moderately severe depression, 20-27 = Severe depression   Psychosocial Evaluation and Intervention: Psychosocial Evaluation - 06/29/17 0932      Psychosocial Evaluation &  Interventions   Comments  Counselor met with Ronald Miller [facility]) today for initial psychosocial evaluation -as he has returned to this program for the 2nd time this year.  He is a 61 year old who had a LVAD procedure on 09/09/16.  Ronald Miller continues to have a strong support system with sever sisters, a mother and a daughter who live locally.  He has diabetes and poor memory issues in addition to his heart condition.  He reports sleeping poorly due to nausea in the evenings particularly.  He has Ambien to take PRN for this but reports he takes it rarely.  He has a good appetite.  Ronald Miller states he is in a good mood generally speaking; even though he has multiple stressors with his health and limited finances - as he is on disability and has a lot of medical bills.  Ronald Miller admits to a history of mild depression and some current anxiety with racing thoughts which impact his sleep at  night.  He is no longer on the heart transplant list.  Ronald Miller has goals to increase his stamina and strength while in this program.  Counselor suggested he speak with his Dr. about his sleep problems (nausea) and his racing thoughts/anxiety symptoms and he states he has an appointment next week and will address this.  Staff will be following with Ronald Miller throughout the course of this program.      Expected Outcomes  Ronald Miller will benefit from consistent exercise to achieve his stated goals.   The educational and psychoeducational components of this program will be helpful in Ronald Miller understanding and coping more positively with his medical condition.      Continue Psychosocial Services   Follow up required by staff       Psychosocial Re-Evaluation: Psychosocial Re-Evaluation    Row Name 07/15/17 0636 08/03/17 6948           Psychosocial Re-Evaluation   Current issues with  Current Stress Concerns  -      Comments  Ronald Miller called and left a vm and said "I want to be there in Cardiac Rehab but like probably so many others because of the [place] rain I  have a yard full of rain and mud. I am going to prove to you that I will return to Cardiac Rehab but I can't walk through my yard right now".   Counselor follow up with Ronald Miller who returned after several weeks out of this program.  He states the rain and he had a cold that prohibited his coming - but he also admitted to "just making excuses" not to come at times.  He agrees this program is helpful and he needs to be committed and attend regularly for it to be effective.  Ronald Miller states he continues to experience racing thoughts and chronic insomnia.  He is afraid to take the Ambien at night before driving to this class.  Counselor encouraged he take it on his "off days" if he is not going out.  Counselor also encouraged Ronald Miller to speak to his Dr. about his continued "racing thoughts" although he is taking medication for anxiety.  He reported his "favorite nurse" is back and he will speak with her about this.  Ronald Miller agrees that he will begin to come to Cardiac rehab more consistently - which will help him achieve his goals and may help his sleep and anxiety as well, which counselor informed him about.        Expected Outcomes  -  Ronald Miller will speak to his nurse about his racing thoughts and possibly adjusting medications for this.  He will also take the sleep aid on nights he has no place to go the next day to see if it helps him at least get more than 2 hours/night.  Ronald Miller plans to come to class more consistently to achieve his goals to increase his stamina and strength.        Interventions  Encouraged to attend Cardiac Rehabilitation for the exercise  -      Continue Psychosocial Services   Follow up required by staff  Follow up required by staff      Comments  same as 2 weeks ago also.  -        Initial Review   Source of Stress Concerns  Transportation;Chronic Illness  -         Psychosocial Discharge (Final Psychosocial Re-Evaluation): Psychosocial Re-Evaluation - 08/03/17 5462      Psychosocial Re-Evaluation  Comments  Counselor follow up with Ronald Miller who returned after several weeks out of this program.  He states the rain and he had a cold that prohibited his coming - but he also admitted to "just making excuses" not to come at times.  He agrees this program is helpful and he needs to be committed and attend regularly for it to be effective.  Ronald Miller states he continues to experience racing thoughts and chronic insomnia.  He is afraid to take the Ambien at night before driving to this class.  Counselor encouraged he take it on his "off days" if he is not going out.  Counselor also encouraged Ronald Miller to speak to his Dr. about his continued "racing thoughts" although he is taking medication for anxiety.  He reported his "favorite nurse" is back and he will speak with her about this.  Ronald Miller agrees that he will begin to come to Cardiac rehab more consistently - which will help him achieve his goals and may help his sleep and anxiety as well, which counselor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

## 2017-08-11 ENCOUNTER — Ambulatory Visit (HOSPITAL_COMMUNITY): Payer: Self-pay | Admitting: *Deleted

## 2017-08-11 ENCOUNTER — Ambulatory Visit (HOSPITAL_COMMUNITY)
Admission: RE | Admit: 2017-08-11 | Discharge: 2017-08-11 | Disposition: A | Discharge status: Home / Self Care | Payer: Medicare HMO | Source: Ambulatory Visit | Attending: Internal Medicine | Admitting: Internal Medicine

## 2017-08-11 ENCOUNTER — Other Ambulatory Visit (HOSPITAL_COMMUNITY): Payer: Self-pay

## 2017-08-11 DIAGNOSIS — Z95811 Presence of heart assist device: Secondary | ICD-10-CM | POA: Insufficient documentation

## 2017-08-11 LAB — PROTIME-INR
INR: 2.26
Prothrombin Time: 24.8 seconds — ABNORMAL HIGH (ref 11.4–15.2)

## 2017-08-11 NOTE — Addendum Note (Signed)
Encounter addended by: Christinia Gully, RN on: 08/11/2017 2:17 PM  Actions taken: Sign clinical note

## 2017-08-11 NOTE — Progress Notes (Signed)
Exit Site Care: Drive line is being maintained weekly  by VAD Coordinators. Drive line exit site well healed and incorporated. The velour is fully implanted at exit site. Dressing dry and intact. No erythema or drainage. Stabilization device present and accurately applied. Pt denies fever or chills.   Pt states that he has been having trouble sleeping at night. Pt states that he isnt falling asleep until 5am. Pt is going to try Benadryl tonight as he has never tried this before.  Pts weight was up 3 pounds today, [place] saw pt today and pt has already taken an extra fluid pill. Pt instructed to call the office tomorrow if his weight does not decrease. Pt denies SOB, CP, orthopnea, PND or edema.   Tanda Rockers RN [place] Coordinator   Office: [phone] 24/7 Emergency VAD Pager: [phone]

## 2017-08-16 ENCOUNTER — Encounter: Payer: Self-pay | Admitting: *Deleted

## 2017-08-16 ENCOUNTER — Telehealth: Payer: Self-pay | Admitting: *Deleted

## 2017-08-16 DIAGNOSIS — Z95811 Presence of heart assist device: Secondary | ICD-10-CM

## 2017-08-16 DIAGNOSIS — I5022 Chronic systolic (congestive) heart failure: Secondary | ICD-10-CM
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
## 2017-08-18 ENCOUNTER — Telehealth: Payer: Self-pay | Admitting: *Deleted

## 2017-08-18 ENCOUNTER — Other Ambulatory Visit (HOSPITAL_COMMUNITY): Payer: Self-pay

## 2017-08-18 ENCOUNTER — Telehealth (HOSPITAL_COMMUNITY): Payer: Self-pay | Admitting: Unknown Physician Specialty

## 2017-08-18 ENCOUNTER — Telehealth (HOSPITAL_COMMUNITY): Payer: Self-pay | Admitting: *Deleted

## 2017-08-18 MED ORDER — DIGOXIN 125 MCG PO TABS: 0.1250 mg | ORAL_TABLET | Freq: Every day | ORAL | 6 refills | Status: DC
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
## 2017-08-18 NOTE — Telephone Encounter (Signed)
Pt called requesting refill for Digoxin. Refill provided.

## 2017-08-19 ENCOUNTER — Ambulatory Visit (HOSPITAL_COMMUNITY)
Admission: RE | Admit: 2017-08-19 | Discharge: 2017-08-19 | Disposition: A | Discharge status: Home / Self Care | Payer: Medicare HMO | Source: Ambulatory Visit | Attending: Cardiology | Admitting: Cardiology

## 2017-08-19 ENCOUNTER — Ambulatory Visit (HOSPITAL_COMMUNITY): Payer: Self-pay | Admitting: Pharmacist

## 2017-08-19 ENCOUNTER — Other Ambulatory Visit (HOSPITAL_COMMUNITY): Payer: Self-pay | Admitting: Unknown Physician Specialty

## 2017-08-19 DIAGNOSIS — Z95811 Presence of heart assist device: Secondary | ICD-10-CM | POA: Insufficient documentation

## 2017-08-19 DIAGNOSIS — Z7901 Long term (current) use of anticoagulants: Secondary | ICD-10-CM

## 2017-08-19 DIAGNOSIS — Z09 Encounter for follow-up examination after completed treatment for conditions other than malignant neoplasm: Secondary | ICD-10-CM | POA: Insufficient documentation

## 2017-08-19 LAB — PROTIME-INR
INR: 2.28
PROTHROMBIN TIME: 24.9 s — AB (ref 11.4–15.2)

## 2017-08-19 NOTE — Progress Notes (Signed)
Patient presents to clinic today for drive line exit wound care. Existing VAD dressing removed and site care performed using sterile technique. Drive line exit site cleaned with Chlora prep applicators x 2, allowed to dry, and Sorbaview dressing with bio patch re-applied. Exit site healed and incorporated, the velour is fully implanted at exit site. No redness, tenderness, drainage, foul odor or rash noted. Drive line anchor re-applied. Pt denies fever or chills.   Return in 1 week for another dressing change per standard of care. INR to be repeated in 1-2 weeks pending results per anticoagulation protocol.    Pietrina Jagodzinski RN, VAD Coordinator 24/7 pager [phone]  

## 2017-08-24 ENCOUNTER — Ambulatory Visit (HOSPITAL_COMMUNITY)
Admission: RE | Admit: 2017-08-24 | Discharge: 2017-08-24 | Disposition: A | Discharge status: Home / Self Care | Payer: Medicare HMO | Source: Ambulatory Visit | Attending: Cardiology | Admitting: Cardiology

## 2017-08-24 ENCOUNTER — Other Ambulatory Visit (HOSPITAL_COMMUNITY): Payer: Self-pay | Admitting: Unknown Physician Specialty

## 2017-08-24 ENCOUNTER — Other Ambulatory Visit (HOSPITAL_COMMUNITY): Payer: Self-pay | Admitting: Internal Medicine

## 2017-08-24 ENCOUNTER — Ambulatory Visit (HOSPITAL_COMMUNITY): Payer: Self-pay | Admitting: Unknown Physician Specialty

## 2017-08-24 DIAGNOSIS — Z95811 Presence of heart assist device: Secondary | ICD-10-CM | POA: Diagnosis present

## 2017-08-24 DIAGNOSIS — Z7901 Long term (current) use of anticoagulants: Secondary | ICD-10-CM

## 2017-08-24 LAB — PROTIME-INR
INR: 2.31
Prothrombin Time: 25.2 seconds — ABNORMAL HIGH (ref 11.4–15.2)

## 2017-08-24 NOTE — Addendum Note (Signed)
Encounter addended by: Christinia Gully, RN on: 08/24/2017 2:14 PM  Actions taken: Sign clinical note

## 2017-08-24 NOTE — Progress Notes (Signed)
Exit Site Care: Drive line is being maintained weekly  by VAD Coordinators. Drive line exit site well healed and incorporated. The velour is fully implanted at exit site. Dressing dry and intact. No erythema or drainage. Stabilization device present and accurately applied. Pt denies fever or chills.    Tanda Rockers RN [place] Coordinator   Office: [phone] 24/7 Emergency VAD Pager: [phone]

## 2017-08-29 NOTE — Telephone Encounter (Signed)
Opened in error

## 2017-08-30 ENCOUNTER — Ambulatory Visit: Payer: Self-pay | Admitting: Family Medicine

## 2017-08-31 ENCOUNTER — Ambulatory Visit (HOSPITAL_COMMUNITY): Payer: Self-pay | Admitting: Pharmacist

## 2017-08-31 ENCOUNTER — Encounter: Payer: Self-pay | Admitting: *Deleted

## 2017-08-31 ENCOUNTER — Ambulatory Visit (HOSPITAL_COMMUNITY)
Admission: RE | Admit: 2017-08-31 | Discharge: 2017-08-31 | Disposition: A | Discharge status: Home / Self Care | Payer: Medicare HMO | Source: Ambulatory Visit | Attending: Internal Medicine | Admitting: Internal Medicine

## 2017-08-31 DIAGNOSIS — Z95811 Presence of heart assist device: Secondary | ICD-10-CM | POA: Insufficient documentation

## 2017-08-31 DIAGNOSIS — I5022 Chronic systolic (congestive) heart failure: Secondary | ICD-10-CM

## 2017-08-31 DIAGNOSIS — Z7901 Long term (current) use of anticoagulants: Secondary | ICD-10-CM | POA: Insufficient documentation

## 2017-08-31 LAB — PROTIME-INR
INR: 3.05
Prothrombin Time: 31.3 seconds — ABNORMAL HIGH (ref 11.4–15.2)

## 2017-08-31 NOTE — Addendum Note (Signed)
Encounter addended by: Candy Sledge, RN on: 08/31/2017 12:24 PM  Actions taken: Sign clinical note

## 2017-08-31 NOTE — Progress Notes (Signed)
Patient presents to clinic today for drive line exit wound care. Existing VAD dressing removed and site care performed using sterile technique. Drive line exit site cleaned with Chlora prep applicators x 2, allowed to dry, and Sorbaview dressing with bio patch re-applied. Exit site healed and incorporated, the velour is fully implanted at exit site. No redness, tenderness, drainage, foul odor or rash noted. Drive line anchor re-applied. Pt denies fever or chills.   Return in 1 week for another dressing change per standard of care. INR to be repeated in 1-2 weeks pending results per anticoagulation protocol.   Patient states he feels more short of breath today although his weight is only up 1.5 pounds since his last visit. He is not requiring the use of his rollator. His abdomen is full. Spoke with Oda Kilts PA who instructed the patient to take 100 mg BID of Torsemide for 2 days. Then resume dose of 100 mg in the morning and 50 mg in the evening.   Balinda Quails RN, VAD Coordinator 24/7 pager [phone]

## 2017-08-31 NOTE — Patient Instructions (Addendum)
Return to clinic as scheudled on 12/4 at 11 AM. Bring your home equipment.  Take an extra 50 mg Torsemide tonight and tomorrow night. This equals taking 100 mg in the morning and 100 mg in the evening for 2 days. Then resume 100 mg in the morning and 50 mg in the evening.  Go to cardiac rehab!

## 2017-09-02 ENCOUNTER — Encounter: Payer: Self-pay | Admitting: *Deleted

## 2017-09-02 ENCOUNTER — Telehealth: Payer: Self-pay | Admitting: *Deleted

## 2017-09-02 DIAGNOSIS — I5022 Chronic systolic (congestive) heart failure: Secondary | ICD-10-CM

## 2017-09-02 DIAGNOSIS — Z95811 Presence of heart assist device: Secondary | ICD-10-CM

## 2017-09-02 NOTE — Telephone Encounter (Signed)
Ronald Miller has been not been feeling well and is starting to feel better.  He has not been getting out of the house much and trying to avoid people from getting sicker. He realizes that by coming to rehab it will help him feel better.

## 2017-09-02 NOTE — Telephone Encounter (Signed)
Called to check on status of return.  Left message on voicemail. 

## 2017-09-05 ENCOUNTER — Other Ambulatory Visit (HOSPITAL_COMMUNITY): Payer: Self-pay | Admitting: *Deleted

## 2017-09-05 ENCOUNTER — Encounter: Payer: Medicare HMO | Attending: Internal Medicine

## 2017-09-05 DIAGNOSIS — Z87891 Personal history of nicotine dependence: Secondary | ICD-10-CM | POA: Insufficient documentation

## 2017-09-05 DIAGNOSIS — J449 Chronic obstructive pulmonary disease, unspecified: Secondary | ICD-10-CM | POA: Insufficient documentation

## 2017-09-05 DIAGNOSIS — Z7901 Long term (current) use of anticoagulants: Secondary | ICD-10-CM

## 2017-09-05 DIAGNOSIS — Z95811 Presence of heart assist device: Secondary | ICD-10-CM

## 2017-09-05 DIAGNOSIS — F329 Major depressive disorder, single episode, unspecified: Secondary | ICD-10-CM | POA: Insufficient documentation

## 2017-09-05 DIAGNOSIS — I110 Hypertensive heart disease with heart failure: Secondary | ICD-10-CM | POA: Insufficient documentation

## 2017-09-05 DIAGNOSIS — E119 Type 2 diabetes mellitus without complications: Secondary | ICD-10-CM | POA: Insufficient documentation

## 2017-09-05 DIAGNOSIS — I5022 Chronic systolic (congestive) heart failure: Secondary | ICD-10-CM | POA: Insufficient documentation

## 2017-09-05 DIAGNOSIS — I5043 Acute on chronic combined systolic (congestive) and diastolic (congestive) heart failure: Secondary | ICD-10-CM

## 2017-09-05 DIAGNOSIS — Z95810 Presence of automatic (implantable) cardiac defibrillator: Secondary | ICD-10-CM | POA: Insufficient documentation

## 2017-09-05 DIAGNOSIS — I2510 Atherosclerotic heart disease of native coronary artery without angina pectoris: Secondary | ICD-10-CM | POA: Insufficient documentation

## 2017-09-06 ENCOUNTER — Encounter (HOSPITAL_COMMUNITY): Payer: Self-pay

## 2017-09-06 ENCOUNTER — Ambulatory Visit (HOSPITAL_COMMUNITY): Payer: Self-pay | Admitting: Pharmacist

## 2017-09-06 ENCOUNTER — Ambulatory Visit (HOSPITAL_COMMUNITY)
Admission: RE | Admit: 2017-09-06 | Discharge: 2017-09-06 | Disposition: A | Discharge status: Home / Self Care | Payer: Medicare HMO | Source: Ambulatory Visit | Attending: Cardiology | Admitting: Cardiology

## 2017-09-06 VITALS — BP 84/0 | HR 84 | Ht 69.0 in | Wt 236.0 lb

## 2017-09-06 DIAGNOSIS — E119 Type 2 diabetes mellitus without complications: Secondary | ICD-10-CM | POA: Insufficient documentation

## 2017-09-06 DIAGNOSIS — I50810 Right heart failure, unspecified: Secondary | ICD-10-CM

## 2017-09-06 DIAGNOSIS — F1410 Cocaine abuse, uncomplicated: Secondary | ICD-10-CM | POA: Insufficient documentation

## 2017-09-06 DIAGNOSIS — E871 Hypo-osmolality and hyponatremia: Secondary | ICD-10-CM | POA: Diagnosis not present

## 2017-09-06 DIAGNOSIS — R040 Epistaxis: Secondary | ICD-10-CM | POA: Insufficient documentation

## 2017-09-06 DIAGNOSIS — D508 Other iron deficiency anemias: Secondary | ICD-10-CM | POA: Insufficient documentation

## 2017-09-06 DIAGNOSIS — Z951 Presence of aortocoronary bypass graft: Secondary | ICD-10-CM | POA: Diagnosis not present

## 2017-09-06 DIAGNOSIS — I82721 Chronic embolism and thrombosis of deep veins of right upper extremity: Secondary | ICD-10-CM | POA: Diagnosis not present

## 2017-09-06 DIAGNOSIS — Z7982 Long term (current) use of aspirin: Secondary | ICD-10-CM | POA: Diagnosis not present

## 2017-09-06 DIAGNOSIS — Z87891 Personal history of nicotine dependence: Secondary | ICD-10-CM | POA: Diagnosis not present

## 2017-09-06 DIAGNOSIS — F329 Major depressive disorder, single episode, unspecified: Secondary | ICD-10-CM | POA: Diagnosis not present

## 2017-09-06 DIAGNOSIS — I509 Heart failure, unspecified: Secondary | ICD-10-CM

## 2017-09-06 DIAGNOSIS — Z794 Long term (current) use of insulin: Secondary | ICD-10-CM | POA: Insufficient documentation

## 2017-09-06 DIAGNOSIS — J449 Chronic obstructive pulmonary disease, unspecified: Secondary | ICD-10-CM | POA: Insufficient documentation

## 2017-09-06 DIAGNOSIS — I480 Paroxysmal atrial fibrillation: Secondary | ICD-10-CM | POA: Diagnosis not present

## 2017-09-06 DIAGNOSIS — Z7901 Long term (current) use of anticoagulants: Secondary | ICD-10-CM

## 2017-09-06 DIAGNOSIS — I5043 Acute on chronic combined systolic (congestive) and diastolic (congestive) heart failure: Secondary | ICD-10-CM

## 2017-09-06 DIAGNOSIS — I2510 Atherosclerotic heart disease of native coronary artery without angina pectoris: Secondary | ICD-10-CM | POA: Diagnosis not present

## 2017-09-06 DIAGNOSIS — Z95811 Presence of heart assist device: Secondary | ICD-10-CM | POA: Diagnosis not present

## 2017-09-06 DIAGNOSIS — I110 Hypertensive heart disease with heart failure: Secondary | ICD-10-CM | POA: Diagnosis present

## 2017-09-06 DIAGNOSIS — I5022 Chronic systolic (congestive) heart failure: Secondary | ICD-10-CM | POA: Insufficient documentation

## 2017-09-06 DIAGNOSIS — Z79899 Other long term (current) drug therapy: Secondary | ICD-10-CM | POA: Diagnosis not present

## 2017-09-06 DIAGNOSIS — K319 Disease of stomach and duodenum, unspecified: Secondary | ICD-10-CM | POA: Diagnosis not present

## 2017-09-06 DIAGNOSIS — I5082 Biventricular heart failure: Secondary | ICD-10-CM | POA: Diagnosis not present

## 2017-09-06 DIAGNOSIS — Z950 Presence of cardiac pacemaker: Secondary | ICD-10-CM | POA: Diagnosis not present

## 2017-09-06 LAB — BASIC METABOLIC PANEL
Anion gap: 10 (ref 5–15)
BUN: 29 mg/dL — AB (ref 6–20)
CALCIUM: 8.7 mg/dL — AB (ref 8.9–10.3)
CO2: 33 mmol/L — ABNORMAL HIGH (ref 22–32)
CREATININE: 1.62 mg/dL — AB (ref 0.61–1.24)
Chloride: 92 mmol/L — ABNORMAL LOW (ref 101–111)
GFR, EST AFRICAN AMERICAN: 52 mL/min — AB (ref >60)
GFR, EST NON AFRICAN AMERICAN: 45 mL/min — AB (ref >60)
Glucose, Bld: 149 mg/dL — ABNORMAL HIGH (ref 65–99)
Potassium: 4.5 mmol/L (ref 3.5–5.1)
SODIUM: 135 mmol/L (ref 135–145)

## 2017-09-06 LAB — CBC
HCT: 31 % — ABNORMAL LOW (ref 39.0–52.0)
HEMOGLOBIN: 8.8 g/dL — AB (ref 13.0–17.0)
MCH: 21.5 pg — AB (ref 26.0–34.0)
MCHC: 28.4 g/dL — ABNORMAL LOW (ref 30.0–36.0)
MCV: 75.6 fL — AB (ref 78.0–100.0)
Platelets: 200 10*3/uL (ref 150–400)
RBC: 4.1 MIL/uL — AB (ref 4.22–5.81)
RDW: 18.9 % — ABNORMAL HIGH (ref 11.5–15.5)
WBC: 6 10*3/uL (ref 4.0–10.5)

## 2017-09-06 LAB — PROTIME-INR
INR: 3.79
PROTHROMBIN TIME: 37.1 s — AB (ref 11.4–15.2)

## 2017-09-06 LAB — LACTATE DEHYDROGENASE: LDH: 170 U/L (ref 98–192)

## 2017-09-06 LAB — PREALBUMIN: PREALBUMIN: 17.9 mg/dL — AB (ref 18–38)

## 2017-09-06 MED ORDER — MIDODRINE HCL 5 MG PO TABS: 5.0000 mg | ORAL_TABLET | Freq: Three times a day (TID) | ORAL | 6 refills | Status: DC

## 2017-09-06 NOTE — Progress Notes (Signed)
Advanced Heart Failure Clinic Note    Referring Provider: Darylene Price, FNP Primary Care: [facility] Primary Cardiologist: Dr Haroldine Laws.  Transplant Center- [facility]  HPI: Ronald Miller. is a 61 y.o. male  with history of chronic systolic HF s/p Med[employer] ICD 2014, CAD s/p CABG in 2000, HTN, Hx of cocaine abuse, Tobacco abuse, Depression, PAF, and COPD. HM3 placed 09/09/2016. In April 2018 he was placed on milrinone but later switched to dobutamine. He remains on dobutamine 7.35mg.   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  On Dobutamine 7.5 mcg.   GI Events 04/2017 - EGD with gastritis.  06/01/2017- Capsule Endoscopy- normal except  tiny angiectasia, nonbleeding in the right colon.  Today he returns VAD follow up. Remains on dobutamine 7.5 mcg. Over the last few days he took an extra 50 mg of torsemide. Weight went down from 225>222 pounds. Overall feeling ok. SOB with exertion. Denies PND/Orthopnea. Appetite ok. Eating ice and drinking extra fluids. No fever or chills. No problems with PICC. No BRBPR. Taking all medications. Has not been back to cardiac rehab. Continue AHC.      [facility]  VAD interrogation & Equipment Management: Speed: 5900 Flow: 5.7 Power: 4.6 PI: 2.7  Alarms: No alarms.  Events: 5-10 PI events daily.   Fixed speed: 5900 Low speed limit: 5600  Primary Controller: Replace back up battery in 25 months. Back up controller: Replace back up battery in 286month  Annual Equipment Maintenance on UBC/PM has been performed   Past Medical History:  Diagnosis Date  . AICD (automatic cardioverter/defibrillator) present   . ASCVD (arteriosclerotic cardiovascular disease)   . Chronic systolic CHF (congestive heart failure) (HCRegino Ramirez  . COPD (chronic obstructive pulmonary disease) (HCAnton  . Coronary artery disease   . Depression   . Diabetes mellitus   .  History of cocaine abuse   . Hypertension   . Presence of permanent cardiac pacemaker   . Shortness of breath dyspnea   . Suicidal ideation   . Tobacco abuse     Current Outpatient Medications  Medication Sig Dispense Refill  . acetaminophen (TYLENOL) 500 MG tablet Take 1,000 mg by mouth every 6 (six) hours as needed for moderate pain or headache.    . Marland Kitchenmiodarone (PACERONE) 200 MG tablet Take 1 tablet (200 mg total) by mouth daily. 30 tablet 6  . blood glucose meter kit and supplies KIT Dispense based on patient and insurance preference. Use up to four times daily as directed. (FOR ICD-9 250.00, 250.01). Please include strips and lancets. 1 each 0  . citalopram (CELEXA) 20 MG tablet Take 1 tablet (20 mg total) by mouth daily. 30 tablet 6  . digoxin (DIGOX) 0.125 MG tablet Take 1 tablet (0.125 mg total) daily by mouth. 30 tablet 6  . DOBUTamine (DOBUTREX) 4-5 MG/ML-% infusion Inject 571.8 mcg/min into the vein continuous. PER [facility]    . docusate sodium (COLACE) 100 MG capsule Take 200 mg by mouth at bedtime.     . gabapentin (NEURONTIN) 100 MG capsule Take 3 capsules (300 mg total) by mouth 2 (two) times daily. 120 capsule 3  . insulin aspart (NOVOLOG FLEXPEN) 100 UNIT/ML FlexPen Inject 5 Units into the skin 3 (three) times daily with meals. 15 mL 11  . Insulin Glargine (LANTUS SOLOSTAR) 100 UNIT/ML Solostar Pen Inject 20 Units into the skin daily at 10 pm. 15 mL 11  .  magnesium oxide (MAG-OX) 400 (241.3 Mg) MG tablet Take 1 tablet (400 mg total) by mouth daily. 30 tablet 6  . midodrine (PROAMATINE) 5 MG tablet Take 1 tablet (5 mg total) by mouth 3 (three) times daily with meals. 90 tablet 6  . Multiple Vitamin (MULTIVITAMIN WITH MINERALS) TABS tablet Take 1 tablet by mouth daily.    . mupirocin ointment (BACTROBAN) 2 % Apply 1 application topically to leg blisters 2 times daily or as needed 22 g 6  . ondansetron (ZOFRAN ODT) 4 MG disintegrating tablet Take 1 tablet (4 mg total) by  mouth every 8 (eight) hours as needed for nausea. 6 tablet 0  . pantoprazole (PROTONIX) 40 MG tablet Take 1 tablet (40 mg total) by mouth 2 (two) times daily before a meal. 60 tablet 6  . potassium chloride SA (K-DUR,KLOR-CON) 20 MEQ tablet Take 1 tablet (20 mEq total) by mouth 2 (two) times daily. 60 tablet 3  . senna-docusate (SENOKOT S) 8.6-50 MG tablet Take 2 tablets by mouth at bedtime as needed for mild constipation. 30 tablet 6  . spironolactone (ALDACTONE) 25 MG tablet Take 0.5 tablets (12.5 mg total) by mouth daily. 15 tablet 6  . sucralfate (CARAFATE) 1 GM/10ML suspension Take 10 mLs (1 g total) by mouth 4 (four) times daily -  with meals and at bedtime. 420 mL 6  . torsemide (DEMADEX) 100 MG tablet Take 132m in the morning and 581min the evening 90 tablet 6  . traMADol (ULTRAM) 50 MG tablet Take 1 tablet (50 mg total) by mouth 3 (three) times daily as needed for severe pain. 30 tablet 0  . warfarin (COUMADIN) 5 MG tablet Take 7.5 mg (1 and 1/2 tablets) daily except 10 mg (2 tablets) on Mon/Fri.    . zolpidem (AMBIEN) 10 MG tablet Take 1 tablet (10 mg total) by mouth at bedtime as needed for sleep. 30 tablet 2   No current facility-administered medications for this encounter.     Codeine; Trazodone and nefazodone; Lipitor [atorvastatin]; and Tape  REVIEW OF SYSTEMS: All systems negative except as listed in HPI, PMH, and Problem list.    Vitals:   09/06/17 1136  BP: (!) 84/41  Pulse: 76  SpO2: 93%    Wt Readings from Last 3 Encounters:  09/06/17 236 lb (107 kg)  08/02/17 229 lb 9.6 oz (104.1 kg)  07/20/17 224 lb (101.6 kg)    Vital Signs:  Doppler Pressure 84            Automatc BP: 84/41 (56)   Weight: 236 pounds  w/o eqt Last weight: 229.6 pounds.  Home weights: 222-225 pounds.      Physical Exam: GENERAL: Well appearing, male who presents to clinic today in no acute distress. HEENT: normal  NECK: Supple, JVP ~10.  2+ bilaterally, no bruits.  No  lymphadenopathy or thyromegaly appreciated.   CARDIAC:  Mechanical heart sounds with LVAD hum present. R upper chest tunneled PICC LUNGS:  Clear to auscultation bilaterally.  ABDOMEN:  Distended, round, nontender, positive bowel sounds x4.     LVAD exit site: well-healed and incorporated.  Dressing dry and intact.  No erythema or drainage.  Stabilization device present and accurately applied.  Driveline dressing is being changed daily per sterile technique. EXTREMITIES:  Warm and dry, no cyanosis, clubbing, rash. R and LLE  Trace-1+  edema  NEUROLOGIC:  Alert and oriented x 4.  Gait steady.  No aphasia.  No dysarthria.  Affect pleasant.  ASSESSMENT AND PLAN: 1. Chronic end-stage biventricular systolic HF: LVEF 70% due to ICM -> s/p Echo 08/24/16 LVEF 15%, RV mild dilated, moderately reduced. 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  Has been turned down for [facility] at [facility].  - Complicated by RV failure. Initially on milrinone but stopped due to low mixed venous saturation. Switched to dobutamine.   - Remains on dobutamine 7.5 mcg. Continue HH.  - Maps soft. Increased midodrine to 5 mg three times a day. - Volume status ok. He will never be completely dry due to RV failure. Continue torsemide 100 mg/ 50 mg daily.  - Continue sildenafil 40 tid.    - No beta blocker with RV failure.  - VAD parameters personally reviewed and stable.  - Warfarin with goal INR 2.0 - 2.5 + ASA 81 daily.  -INR high today. Discussed with Pharm D.  - Had epistaxis with lovenox, will not use  - Driveline stable.  - Nop S/S infeciton.   2. CAD: Severe 3v- CAD s/p CABG with occluded grafts as above except for LIMA.  - No S/S ischemia .   3. DMII:  - Per PCP. - HgbA1c 6.5 4. Tobacco abuse:  - Reports complete cessation 5. Atrial fibrillation, paroxysmal - NSR on exam. Continue amio 200 bid 6. Left pleural effusion - underwent repeat thoracentesis in Jan. 2018.  - No recurrence.  7.  Anxiety/Depression - Continue  Celexa 49m daily . - Social issues remain a challenge. He does much better when he is staying with his sister. No change.  8. RUE DVT.  - Continue warfarin.  9. Hyponatremia: Todays sodium 135. Resolved.   10. Anemia, iron-deficiency:   - Has had GI work up with EGD and Capsule Endoscopy showing severe gastropathy.  - Continue cararfate.  - Stable Hgb today. No S/S of bleeding.     Greater than 50% of the (total minutes 45) visit spent in counseling/coordination of care regarding heart diet, medications changes, and resuming cardiac rehab.   Check MAP next week as we increasing midodrine today.   Follow up in 8 weeks.  ADarrick Grinder NP  11:29 AM

## 2017-09-06 NOTE — Progress Notes (Signed)
Patient presents for 1 month follow up in [facility] today along 1 year Intermacs and VAD equipment annual maintenance.  Pt reports no problems with VAD equipment or concerns with drive line. He has not started cardiac rehab at [facility] due to "flooding" over his walkway at his home. Has had some dizziness this am. Reports he is sleeping better since last visit, but doesn't know why, states he hasn't changed any routines or started any new meds.  Abdomen noted to be distended and firm. Pt states he took "extra Torsemide over last two days" - took 100 mg am and pm x 2 days with home weight dropping from 225 - 222 lbs over last two days.   Vital Signs:  Doppler Pressure: 84 Automatc BP:  84/41 (56) HR:  76  SPO2: unable to pick up  Weight:  236.2 lb w/o eqt Last weight: 229.6 lb Home weights: 225 - 222 lbs   VAD Indication: Destination Therapy- eval complete at [facility]  VAD interrogation & Equipment Management: Speed:5900 Flow: 5.7 Power:4.6 w    PI: 2.7  Alarms: no clinical alarms Events: 5 - 18 PI events daily  Fixed speed 5900 Low speed limit: 5600 HCT: 30  Primary Controller:  Replace back up battery in 25 months. Back up controller:   Replace back up battery in 25 months.  Annual Equipment Maintenance on UBC/PM was performed today.   I reviewed the LVAD parameters from today and compared the results to the patient's prior recorded data. LVAD interrogation was NEGATIVE for significant power changes, NEGATIVE for clinical alarms and STABLE for PI events/speed drops. No programming changes were made and pump is functioning within specified parameters. Pt is performing daily controller and system monitor self tests along with completing weekly and monthly maintenance for LVAD equipment.  LVAD equipment check completed and is in good working order. Back-up equipment present. Charged back up battery and performed self-test on equipment.   Exit Site Care: Drive line is being  maintained weekly  by VAD Coordinators. Drive line exit site well healed and incorporated. The velour is fully implanted at exit site. Dressing dry and intact. No erythema or drainage. Stabilization device present and accurately applied. Pt does have rash under dressing and around anchor; states the anchor usually "breaks me out". Switched to [employer]. Will not use Chloraprep swabs or skin prep. Cleansed with Betadine and rinsed with sterile saline. Pt denies fever or chills. Instructed patient to call clinic if rash becomes any worse.      Significant Events on VAD Support:  11/30/16> Milrinone gtt at discharge 01/27/17>dobutamine at 5 mcg/kg/mingtt at discharge 03/08/17> RV failure 05/19/17 >dobutamine decreased from 7.5 to 6  06/2017> admit with syncope, AMS, Dobutamine at 7.41mcg  Device:Protect Therapies: off; end of service 08/2016 Last check: complete interrogation 05/26/2017. DDD (lower rate 60); BiV pacing with 96% AS-VP   BP & Labs:  MAP 84 - modified sytolic  Hgb 8.8 - No S/S of bleeding. Specifically denies melena/BRBPR or nosebleeds.  1 year Intermacs follow up completed including:  Quality of Life and KCCQ-12. Attempted Neurocognitive trail making, unable to complete.  Pt unable to perform  6 minute walk due to physical limitations.  Batteries Manufacture Date: Number of uses: Re-calibration  05/25/16 40 - 48 To be performed by patient   Annual maintenance completed per [employer] on patient's MPU and universal Char[employer] fundraiser.    Back up controller:  11V backup battery charged during this visit.  Patient Instructions: 1.  Increase your Midodrine to 5 mg three times daily. 2.  Return to [place] clinic in one week for dressing change and BP check. 3.  Limit your ice chips/fluid intake. Call VAD office/pager if SOB worsens.   Zada Girt RN [place] Coordinator   Office: [phone] 24/7 Emergency VAD Pager: [phone]

## 2017-09-06 NOTE — Progress Notes (Signed)
error 

## 2017-09-06 NOTE — Patient Instructions (Addendum)
1.  Increase your Midodrine to 5 mg three times daily. 2.  Return to [facility] in one week for dressing change and BP check. 3.  Limit your ice chips/fluid intake. Call VAD office/pager if SOB worsens.

## 2017-09-07 ENCOUNTER — Telehealth: Payer: Self-pay | Admitting: *Deleted

## 2017-09-07 ENCOUNTER — Encounter: Payer: Self-pay | Admitting: *Deleted

## 2017-09-07 ENCOUNTER — Telehealth: Payer: Self-pay | Admitting: Licensed Clinical Social Worker

## 2017-09-07 DIAGNOSIS — I5022 Chronic systolic (congestive) heart failure: Secondary | ICD-10-CM

## 2017-09-07 NOTE — Telephone Encounter (Signed)
-----   Message from Candy Sledge, RN sent at 09/07/2017 11:49 AM EST ----- Regarding: Schedule change for cardiac rehab Good morning!  This patient is requesting to change to afternoons for his cardiac rehab. He feels he would be more compliant if he didn't have to be there so early. I let him know we would call him. Please advise- thank you so much!!!
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
## 2017-09-07 NOTE — Telephone Encounter (Signed)
Left message with [name] to call back with when to start change.

## 2017-09-07 NOTE — Telephone Encounter (Signed)
Called to check on status of return. Left message.  He was encouraged to return to rehab by VAD clinic.

## 2017-09-07 NOTE — Telephone Encounter (Signed)
Patient called CSW to request some financial assistance to bridge the gap until next check. Patient reports he has had some unexpected bills and just needs a little help. CSW assisted with a gift card to [facility] to assist with gas and medication co-pays. Patient very grateful for assistance. CSW continues to be available as needed. Raquel Sarna, [place], [place]

## 2017-09-09 ENCOUNTER — Other Ambulatory Visit (HOSPITAL_COMMUNITY): Payer: Self-pay | Admitting: Unknown Physician Specialty

## 2017-09-09 DIAGNOSIS — Z95811 Presence of heart assist device: Secondary | ICD-10-CM

## 2017-09-09 DIAGNOSIS — Z7901 Long term (current) use of anticoagulants: Secondary | ICD-10-CM

## 2017-09-13 ENCOUNTER — Other Ambulatory Visit (HOSPITAL_COMMUNITY): Payer: Self-pay

## 2017-09-13 ENCOUNTER — Other Ambulatory Visit (HOSPITAL_COMMUNITY): Payer: Self-pay | Admitting: Internal Medicine

## 2017-09-14 ENCOUNTER — Other Ambulatory Visit (HOSPITAL_COMMUNITY): Payer: Self-pay

## 2017-09-19 ENCOUNTER — Ambulatory Visit (HOSPITAL_COMMUNITY): Payer: Self-pay | Admitting: Pharmacist

## 2017-09-19 ENCOUNTER — Ambulatory Visit (HOSPITAL_COMMUNITY)
Admission: RE | Admit: 2017-09-19 | Discharge: 2017-09-19 | Disposition: A | Discharge status: Home / Self Care | Payer: Medicare HMO | Source: Ambulatory Visit | Attending: Internal Medicine | Admitting: Internal Medicine

## 2017-09-19 DIAGNOSIS — Z7901 Long term (current) use of anticoagulants: Secondary | ICD-10-CM | POA: Diagnosis present

## 2017-09-19 DIAGNOSIS — Z4800 Encounter for change or removal of nonsurgical wound dressing: Secondary | ICD-10-CM | POA: Insufficient documentation

## 2017-09-19 DIAGNOSIS — Z95811 Presence of heart assist device: Secondary | ICD-10-CM | POA: Diagnosis present

## 2017-09-19 LAB — PROTIME-INR
INR: 3.09
Prothrombin Time: 31.6 seconds — ABNORMAL HIGH (ref 11.4–15.2)

## 2017-09-19 NOTE — Addendum Note (Signed)
Encounter addended by: Candy Sledge, RN on: 09/19/2017 3:30 PM  Actions taken: Sign clinical note

## 2017-09-19 NOTE — Progress Notes (Signed)
Patient presents to clinic today for drive line exit wound care. Existing VAD dressing removed and site care performed using sterile technique. Drive line exit site cleaned with Chlora prep applicators x 2, allowed to dry, and Sorbaview dressing with bio patch re-applied. Exit site healed and incorporated, the velour is fully implanted at exit site. No redness, tenderness, drainage, foul odor or rash noted. Drive line anchor re-applied. Pt denies fever or chills.   Return in 1 week for another dressing change per standard of care. INR to be repeated in 1-2 weeks pending results per anticoagulation protocol.    Falecia Vannatter RN, VAD Coordinator 24/7 pager [phone]  

## 2017-09-20 ENCOUNTER — Telehealth: Payer: Self-pay | Admitting: Unknown Physician Specialty

## 2017-09-20 NOTE — Telephone Encounter (Signed)
Received a call from Judson Roch the pts [facility] nurse stating that the pt is up 3 lbs today. Pt mets criteria for lasix protocol and extra potassium. Pt will get IV lasix today. Pt/nurse informed that if wt does not improve by morning to please call the VAD office. Both pt/nurse verbalized understanding of instructions that were given.   Tanda Rockers RN, BSN VAD Coordinator 24/7 Pager [phone]

## 2017-09-21 ENCOUNTER — Other Ambulatory Visit (HOSPITAL_COMMUNITY): Payer: Self-pay | Admitting: *Deleted

## 2017-09-21 ENCOUNTER — Telehealth: Payer: Self-pay | Admitting: *Deleted

## 2017-09-21 ENCOUNTER — Encounter: Payer: Self-pay | Admitting: *Deleted

## 2017-09-21 ENCOUNTER — Telehealth (HOSPITAL_COMMUNITY): Payer: Self-pay | Admitting: *Deleted

## 2017-09-21 DIAGNOSIS — Z95811 Presence of heart assist device: Secondary | ICD-10-CM

## 2017-09-21 DIAGNOSIS — I951 Orthostatic hypotension: Secondary | ICD-10-CM

## 2017-09-21 DIAGNOSIS — I5022 Chronic systolic (congestive) heart failure: Secondary | ICD-10-CM

## 2017-09-21 MED ORDER — MIDODRINE HCL 5 MG PO TABS: 5.0000 mg | ORAL_TABLET | Freq: Three times a day (TID) | ORAL | 6 refills | Status: DC

## 2017-09-21 NOTE — Telephone Encounter (Signed)
Received call from patient stating his weight is up another pound and his belly measures 2 cm larger. He received 80 mg IV Lasix per [facility] yesterday. Per Dr Haroldine Laws, plan to give 100 mg Torsemide BID for two days then resume 100 mg qam and 50 mg qpm. Will see in VAD clinic Wednesday.  Balinda Quails RN, VAD Coordinator 24/7 pager [phone]

## 2017-09-21 NOTE — Telephone Encounter (Signed)
Returned call.  His legs are swollen still.  He is going to try to get here tomorrow afternoon.

## 2017-09-21 NOTE — Telephone Encounter (Signed)
Called to check on status of return.  Left message.  

## 2017-09-22 ENCOUNTER — Other Ambulatory Visit (HOSPITAL_COMMUNITY): Payer: Self-pay | Admitting: *Deleted

## 2017-09-22 MED ORDER — GABAPENTIN 300 MG PO CAPS: 300.0000 mg | ORAL_CAPSULE | Freq: Two times a day (BID) | ORAL | 6 refills | Status: DC

## 2017-09-28 ENCOUNTER — Telehealth (HOSPITAL_COMMUNITY): Payer: Self-pay | Admitting: Unknown Physician Specialty

## 2017-09-28 ENCOUNTER — Other Ambulatory Visit (HOSPITAL_COMMUNITY): Payer: Self-pay

## 2017-09-28 ENCOUNTER — Telehealth: Payer: Self-pay | Admitting: *Deleted

## 2017-09-28 ENCOUNTER — Encounter: Payer: Self-pay | Admitting: *Deleted

## 2017-09-28 DIAGNOSIS — I5022 Chronic systolic (congestive) heart failure: Secondary | ICD-10-CM

## 2017-09-28 DIAGNOSIS — Z95811 Presence of heart assist device: Secondary | ICD-10-CM

## 2017-09-28 MED ORDER — GABAPENTIN 300 MG PO CAPS: 300.0000 mg | ORAL_CAPSULE | Freq: Two times a day (BID) | ORAL | 6 refills | Status: DC

## 2017-09-28 NOTE — Telephone Encounter (Signed)
Pt called stating he needs a refill for Gabapentin. Pt states this was sent to the [facility] mail scripts and needs to be sent to [facility]. Script sent to [facility].  Tanda Rockers RN, BSN VAD Coordinator 24/7 Pager [phone]

## 2017-09-28 NOTE — Telephone Encounter (Signed)
Called to check on patient .  He was supposed to return to rehab last week in the 4pm class.  Left message.

## 2017-09-30 ENCOUNTER — Telehealth (HOSPITAL_COMMUNITY): Payer: Self-pay

## 2017-09-30 ENCOUNTER — Other Ambulatory Visit (HOSPITAL_COMMUNITY): Payer: Self-pay | Admitting: Internal Medicine

## 2017-09-30 ENCOUNTER — Other Ambulatory Visit (HOSPITAL_COMMUNITY): Payer: Self-pay | Admitting: *Deleted

## 2017-09-30 ENCOUNTER — Other Ambulatory Visit (HOSPITAL_COMMUNITY): Payer: Self-pay

## 2017-09-30 ENCOUNTER — Other Ambulatory Visit (HOSPITAL_COMMUNITY): Payer: Self-pay | Admitting: Unknown Physician Specialty

## 2017-09-30 DIAGNOSIS — Z95811 Presence of heart assist device: Secondary | ICD-10-CM

## 2017-09-30 DIAGNOSIS — Z7901 Long term (current) use of anticoagulants: Secondary | ICD-10-CM

## 2017-09-30 MED ORDER — GABAPENTIN 300 MG PO CAPS: 300.0000 mg | ORAL_CAPSULE | Freq: Two times a day (BID) | ORAL | 6 refills | Status: AC

## 2017-09-30 NOTE — Telephone Encounter (Signed)
Received critical INR from [facility] lab. INR 5.11 Reported to Ileene Patrick CHF clinical pharm D who manages anticoag of VAD patients.  Renee Pain, RN

## 2017-09-30 NOTE — Telephone Encounter (Signed)
Melinda with [facility] called to report delay in labs ([facility] did not run last blood drawn). Another set of labs drawn today. Will forward to VAD Coordinators to make them aware.  Renee Pain, RN

## 2017-10-03 DIAGNOSIS — I2510 Atherosclerotic heart disease of native coronary artery without angina pectoris: Secondary | ICD-10-CM | POA: Diagnosis not present

## 2017-10-05 ENCOUNTER — Encounter: Payer: Medicare HMO | Attending: Internal Medicine

## 2017-10-05 ENCOUNTER — Encounter: Payer: Self-pay | Admitting: *Deleted

## 2017-10-05 ENCOUNTER — Telehealth: Payer: Self-pay | Admitting: *Deleted

## 2017-10-05 DIAGNOSIS — I5022 Chronic systolic (congestive) heart failure: Secondary | ICD-10-CM | POA: Insufficient documentation

## 2017-10-05 DIAGNOSIS — Z95810 Presence of automatic (implantable) cardiac defibrillator: Secondary | ICD-10-CM | POA: Insufficient documentation

## 2017-10-05 DIAGNOSIS — Z95811 Presence of heart assist device: Secondary | ICD-10-CM

## 2017-10-05 DIAGNOSIS — I110 Hypertensive heart disease with heart failure: Secondary | ICD-10-CM | POA: Insufficient documentation

## 2017-10-05 DIAGNOSIS — E119 Type 2 diabetes mellitus without complications: Secondary | ICD-10-CM | POA: Insufficient documentation

## 2017-10-05 DIAGNOSIS — Z87891 Personal history of nicotine dependence: Secondary | ICD-10-CM | POA: Insufficient documentation

## 2017-10-05 DIAGNOSIS — I2510 Atherosclerotic heart disease of native coronary artery without angina pectoris: Secondary | ICD-10-CM | POA: Insufficient documentation

## 2017-10-05 DIAGNOSIS — F329 Major depressive disorder, single episode, unspecified: Secondary | ICD-10-CM | POA: Insufficient documentation

## 2017-10-05 DIAGNOSIS — J449 Chronic obstructive pulmonary disease, unspecified: Secondary | ICD-10-CM | POA: Insufficient documentation
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
## 2017-10-05 NOTE — Progress Notes (Signed)
Discharge Progress Report  Patient Details  Name: Ronald Miller. MRN: [id number] Date of Birth: 1956/04/27 Referring Provider:     Cardiac Rehab from 06/29/2017 in [facility]  Referring Provider  Glori Bickers MD       Number of Visits: 9/36  Reason for Discharge:  Early Exit:  Personal  Smoking History:  Social History   Tobacco Use  Smoking Status Former Smoker  . Packs/day: 1.50  . Years: 23.00  . Pack years: 34.50  . Types: Cigarettes  . Start date: 10/24/1992  . Last attempt to quit: 07/01/2016  . Years since quitting: 1.2  Smokeless Tobacco Never Used    Diagnosis:  Heart failure, chronic systolic (HCC)  LVAD (left ventricular assist device) present [facility])  ADL [facility]: Pulmonary Assessment Scores    Row Name 06/29/17 0945         mMRC Score   mMRC Score  2        Initial Exercise Prescription: Initial Exercise Prescription - 06/29/17 0900      Date of Initial Exercise RX and Referring Provider   Date  06/29/17    Referring Provider  Glori Bickers MD      Treadmill   MPH  1    Grade  0    Minutes  15    METs  1.77      NuStep   Level  1    SPM  80    Minutes  15    METs  1.4      Biostep-RELP   Level  1    SPM  50    Minutes  15    METs  2      Prescription Details   Frequency (times per week)  3    Duration  Progress to 45 minutes of aerobic exercise without signs/symptoms of physical distress      Intensity   THRR 40-80% of Max Heartrate  99-140    Ratings of Perceived Exertion  11-13    Perceived Dyspnea  0-4      Progression   Progression  Continue to progress workloads to maintain intensity without signs/symptoms of physical distress.      Resistance Training   Training Prescription  Yes    Weight  2 lbs    Reps  10-15       Discharge Exercise Prescription (Final Exercise Prescription Changes): Exercise Prescription Changes - 08/03/17 1500      Response to Exercise   Blood Pressure  (Admit)  -- 76 dopplar    Blood Pressure (Exercise)  -- 68 dopplar    Blood Pressure (Exit)  -- 66 dopplar    Heart Rate (Admit)  68 bpm    Heart Rate (Exercise)  93 bpm    Heart Rate (Exit)  62 bpm    Rating of Perceived Exertion (Exercise)  13    Symptoms  SOB, fatigue    Comments  Second full day of exercise    Duration  Progress to 45 minutes of aerobic exercise without signs/symptoms of physical distress    Intensity  THRR unchanged      Progression   Progression  Continue to progress workloads to maintain intensity without signs/symptoms of physical distress.    Average METs  2.4      Resistance Training   Training Prescription  Yes    Weight  2 lbs    Reps  10-15      Interval Training  Interval Training  No      NuStep   Level  1    Minutes  15    METs  2.4       Functional Capacity: 6 Minute Walk    Row Name 06/29/17 0930         6 Minute Walk   Phase  Initial     Distance  635 feet     Walk Time  4.48 minutes     # of Rest Breaks  1 1:31     MPH  1.61     METS  1.44     RPE  13     Perceived Dyspnea   3     VO2 Peak  5.04     Symptoms  Yes (comment)     Comments  SOB, chest soreness from walking (Chronic)     Resting HR  59 bpm     Resting BP  - 84 dopplar     Resting Oxygen Saturation   94 %     Exercise Oxygen Saturation  during 6 min walk  93 %     Max Ex. HR  61 bpm     Max Ex. BP  - 90 dopplar       Interval Oxygen   Interval Oxygen?  Yes     Baseline Oxygen Saturation %  94 %     1 Minute Oxygen Saturation %  95 %     1 Minute Liters of Oxygen  0 L room air     2 Minute Oxygen Saturation %  93 %     2 Minute Liters of Oxygen  0 L     3 Minute Oxygen Saturation %  96 %     3 Minute Liters of Oxygen  0 L     4 Minute Oxygen Saturation %  96 %     4 Minute Liters of Oxygen  0 L     5 Minute Oxygen Saturation %  94 %     5 Minute Liters of Oxygen  0 L     6 Minute Oxygen Saturation %  94 %     6 Minute Liters of Oxygen  0 L     2  Minute Post Oxygen Saturation %  97 %     2 Minute Post Liters of Oxygen  0 L        Psychological, QOL, Others - Outcomes: PHQ 2/9: Depression screen PHQ 2/9 06/29/2017  Decreased Interest 0  Down, Depressed, Hopeless 0  PHQ - 2 Score 0  Altered sleeping 2  Tired, decreased energy 2  Change in appetite 0  Feeling bad or failure about yourself  0  Trouble concentrating 0  Moving slowly or fidgety/restless 0  Suicidal thoughts 0  PHQ-9 Score 4  Difficult doing work/chores Somewhat difficult  Some encounter information is confidential and restricted. Go to Review Flowsheets activity to see all data.  Some recent data might be hidden    Quality of Life: Quality of Life - 06/29/17 0948      Quality of Life Scores   Health/Function Pre  18.68 %    Socioeconomic Pre  20.14 %    Psych/Spiritual Pre  24 %    Family Pre  22.25 %    GLOBAL Pre  20.61 %       Personal Goals: Goals established at orientation with interventions provided to work toward goal. Personal Goals and Risk Factors  at Admission - 06/29/17 0942      Core Components/Risk Factors/Patient Goals on Admission    Weight Management  Yes;Obesity;Weight Loss    Intervention  Weight Management: Develop a combined nutrition and exercise program designed to reach desired caloric intake, while maintaining appropriate intake of nutrient and fiber, sodium and fats, and appropriate energy expenditure required for the weight goal.;Weight Management: Provide education and appropriate resources to help participant work on and attain dietary goals.;Weight Management/Obesity: Establish reasonable short term and long term weight goals.;Obesity: Provide education and appropriate resources to help participant work on and attain dietary goals.    Admit Weight  231 lb (104.8 kg)    Goal Weight: Short Term  225 lb (102.1 kg)    Goal Weight: Long Term  221 lb (100.2 kg)    Expected Outcomes  Short Term: Continue to assess and modify  interventions until short term weight is achieved;Long Term: Adherence to nutrition and physical activity/exercise program aimed toward attainment of established weight goal;Weight Loss: Understanding of general recommendations for a balanced deficit meal plan, which promotes 1-2 lb weight loss per week and includes a negative energy balance of [phone] kcal/d;Understanding recommendations for meals to include 15-35% energy as protein, 25-35% energy from fat, 35-60% energy from carbohydrates, less than 200mg  of dietary cholesterol, 20-35 gm of total fiber daily;Understanding of distribution of calorie intake throughout the day with the consumption of 4-5 meals/snacks    Improve shortness of breath with ADL's  Yes    Intervention  Provide education, individualized exercise plan and daily activity instruction to help decrease symptoms of SOB with activities of daily living.    Expected Outcomes  Short Term: Achieves a reduction of symptoms when performing activities of daily living.    Diabetes  Yes    Intervention  Provide education about signs/symptoms and action to take for hypo/hyperglycemia.;Provide education about proper nutrition, including hydration, and aerobic/resistive exercise prescription along with prescribed medications to achieve blood glucose in normal ranges: Fasting glucose 65-99 mg/dL    Expected Outcomes  Short Term: Participant verbalizes understanding of the signs/symptoms and immediate care of hyper/hypoglycemia, proper foot care and importance of medication, aerobic/resistive exercise and nutrition plan for blood glucose control.;Long Term: Attainment of HbA1C < 7%.    Heart Failure  Yes    Intervention  Provide a combined exercise and nutrition program that is supplemented with education, support and counseling about heart failure. Directed toward relieving symptoms such as shortness of breath, decreased exercise tolerance, and extremity edema.    Expected Outcomes  Improve  functional capacity of life;Short term: Attendance in program 2-3 days a week with increased exercise capacity. Reported lower sodium intake. Reported increased fruit and vegetable intake. Reports medication compliance.;Short term: Daily weights obtained and reported for increase. Utilizing diuretic protocols set by physician.;Long term: Adoption of self-care skills and reduction of barriers for early signs and symptoms recognition and intervention leading to self-care maintenance.    Hypertension  Yes    Intervention  Provide education on lifestyle modifcations including regular physical activity/exercise, weight management, moderate sodium restriction and increased consumption of fresh fruit, vegetables, and low fat dairy, alcohol moderation, and smoking cessation.;Monitor prescription use compliance.    Expected Outcomes  Short Term: Continued assessment and intervention until BP is < 140/84mm HG in hypertensive participants. < 130/71mm HG in hypertensive participants with diabetes, heart failure or chronic kidney disease.;Long Term: Maintenance of blood pressure at goal levels.    Lipids  Yes    Intervention  Provide education and support for participant on nutrition & aerobic/resistive exercise along with prescribed medications to achieve LDL 70mg , HDL >40mg .    Expected Outcomes  Short Term: Participant states understanding of desired cholesterol values and is compliant with medications prescribed. Participant is following exercise prescription and nutrition guidelines.;Long Term: Cholesterol controlled with medications as prescribed, with individualized exercise RX and with personalized nutrition plan. Value goals: LDL < 70mg , HDL > 40 mg.    Stress  Yes    Intervention  Offer individual and/or small group education and counseling on adjustment to heart disease, stress management and health-related lifestyle change. Teach and support self-help strategies.;Refer participants experiencing significant  psychosocial distress to appropriate mental health specialists for further evaluation and treatment. When possible, include family members and significant others in education/counseling sessions.    Expected Outcomes  Short Term: Participant demonstrates changes in health-related behavior, relaxation and other stress management skills, ability to obtain effective social support, and compliance with psychotropic medications if prescribed.;Long Term: Emotional wellbeing is indicated by absence of clinically significant psychosocial distress or social isolation.        Personal Goals Discharge: Goals and Risk Factor Review    Row Name 08/03/17 0945             Core Components/Risk Factors/Patient Goals Review   Personal Goals Review  Weight Management/Obesity;Hypertension;Lipids;Heart Failure;Diabetes       Review  [name] returned to rehab today.  His blood pressures have been running low and his sugars have been all over the place.  He was planning to go see the doctor today as he pulled off his anchor for his drive line.       Expected Outcomes  Short: Continue to work on blood sugar.  Long: Continue to work on risk factors.           Exercise Goals and Review: Exercise Goals    Row Name 06/29/17 0751             Exercise Goals   Increase Physical Activity  Yes       Intervention  Provide advice, education, support and counseling about physical activity/exercise needs.;Develop an individualized exercise prescription for aerobic and resistive training based on initial evaluation findings, risk stratification, comorbidities and participant's personal goals.       Expected Outcomes  Achievement of increased cardiorespiratory fitness and enhanced flexibility, muscular endurance and strength shown through measurements of functional capacity and personal statement of participant.       Increase Strength and Stamina  Yes       Intervention  Provide advice, education, support and counseling  about physical activity/exercise needs.;Develop an individualized exercise prescription for aerobic and resistive training based on initial evaluation findings, risk stratification, comorbidities and participant's personal goals.       Expected Outcomes  Achievement of increased cardiorespiratory fitness and enhanced flexibility, muscular endurance and strength shown through measurements of functional capacity and personal statement of participant.       Able to understand and use rate of perceived exertion (RPE) scale  Yes       Intervention  Provide education and explanation on how to use RPE scale       Expected Outcomes  Short Term: Able to use RPE daily in rehab to express subjective intensity level;Long Term:  Able to use RPE to guide intensity level when exercising independently       Able to understand and use Dyspnea scale  Yes  Intervention  Provide education and explanation on how to use Dyspnea scale       Expected Outcomes  Short Term: Able to use Dyspnea scale daily in rehab to express subjective sense of shortness of breath during exertion;Long Term: Able to use Dyspnea scale to guide intensity level when exercising independently       Knowledge and understanding of Target Heart Rate Range (THRR)  Yes       Intervention  Provide education and explanation of THRR including how the numbers were predicted and where they are located for reference       Expected Outcomes  Short Term: Able to state/look up THRR;Long Term: Able to use THRR to govern intensity when exercising independently;Short Term: Able to use daily as guideline for intensity in rehab       Able to check pulse independently  - patient has an LVAD       Understanding of Exercise Prescription  Yes       Intervention  Provide education, explanation, and written materials on patient's individual exercise prescription       Expected Outcomes  Short Term: Able to explain program exercise prescription;Long Term: Able to explain  home exercise prescription to exercise independently          Nutrition & Weight - Outcomes: Pre Biometrics - 06/29/17 0941      Pre Biometrics   Height  5' 9.75" (1.772 m)    Weight  231 lb (104.8 kg)    Waist Circumference  45 inches    Hip Circumference  43.5 inches    Waist to Hip Ratio  1.03 %    BMI (Calculated)  33.37    Single Leg Stand  12.89 seconds        Nutrition: Nutrition Therapy & Goals - 08/03/17 0947      Nutrition Therapy   RD appointment defered  Yes       Nutrition Discharge: Nutrition Assessments - 06/29/17 0946      MEDFICTS Scores   Pre Score  6       Education Questionnaire Score: Knowledge Questionnaire Score - 06/29/17 0944      Knowledge Questionnaire Score   Pre Score  21/28       Goals reviewed with patient; copy given to patient.
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
## 2017-10-05 NOTE — Telephone Encounter (Signed)
Called to check on Ronald Miller.  He continues to have problems both health wise and being able to get here.  We will discharge him at this time. He would like to enroll into our [employer].  We get clearance for him to start.

## 2017-10-06 ENCOUNTER — Other Ambulatory Visit (HOSPITAL_COMMUNITY): Payer: Self-pay

## 2017-10-07 ENCOUNTER — Telehealth: Payer: Self-pay | Admitting: *Deleted

## 2017-10-07 ENCOUNTER — Other Ambulatory Visit (HOSPITAL_COMMUNITY): Payer: Self-pay | Admitting: Internal Medicine

## 2017-10-07 NOTE — Telephone Encounter (Signed)
-----   Message from Jolaine Artist, MD sent at 10/06/2017  8:06 PM EST ----- That would be great. Yes, he is cleared to start. Thanks - db  ----- Message ----- From: Clotilde Dieter Sent: 10/05/2017   2:45 PM To: Jolaine Artist, MD  Dr. Haroldine Laws,  I discharged Ronald Miller from Cardiac Rehab today.  His time to complete the program was running out and I'm afraid insurance would not pay for the program at this point.  However, we would like to enroll him on our [employer] (Maintenance) program.  He would like to attend one of the three day a week classes.  Please let me know if this would work for him as a means to continue to exercise. Also please advise as to when he is cleared to start.  Thanks! Alberteen Sam, MA, ACSM RCEP, CCRP 10/05/2017 2:45 PM

## 2017-10-07 NOTE — Telephone Encounter (Signed)
Clearance to start [employer]

## 2017-10-10 ENCOUNTER — Telehealth (HOSPITAL_COMMUNITY): Payer: Self-pay | Admitting: Unknown Physician Specialty

## 2017-10-10 ENCOUNTER — Encounter (HOSPITAL_COMMUNITY): Payer: Self-pay

## 2017-10-10 NOTE — Telephone Encounter (Signed)
[facility] nurse called stating that [name]'s weight is up to 226 and that he is more SOB and abdomen is distended. Nurse also states that driveline dressing came off and pt reinforced with tape. Nurse was instructed that pt can take an extra 50 mg of Torsemide tonight per Dr. Haroldine Laws. We will change pts dressing at his full clinic visit tomorrow.   Tanda Rockers RN, BSN VAD Coordinator 24/7 Pager [phone]

## 2017-10-11 ENCOUNTER — Other Ambulatory Visit (HOSPITAL_COMMUNITY): Payer: Self-pay | Admitting: *Deleted

## 2017-10-11 ENCOUNTER — Encounter (HOSPITAL_COMMUNITY): Payer: Self-pay

## 2017-10-11 ENCOUNTER — Ambulatory Visit (HOSPITAL_COMMUNITY)
Admission: RE | Admit: 2017-10-11 | Discharge: 2017-10-11 | Disposition: A | Discharge status: Home / Self Care | Payer: Medicare HMO | Source: Ambulatory Visit | Attending: Cardiology | Admitting: Cardiology

## 2017-10-11 ENCOUNTER — Ambulatory Visit (HOSPITAL_COMMUNITY): Payer: Self-pay | Admitting: Pharmacist

## 2017-10-11 VITALS — BP 117/73 | HR 78 | Resp 16 | Ht 69.0 in | Wt 228.6 lb

## 2017-10-11 DIAGNOSIS — I110 Hypertensive heart disease with heart failure: Secondary | ICD-10-CM | POA: Diagnosis not present

## 2017-10-11 DIAGNOSIS — I5082 Biventricular heart failure: Secondary | ICD-10-CM | POA: Insufficient documentation

## 2017-10-11 DIAGNOSIS — Z950 Presence of cardiac pacemaker: Secondary | ICD-10-CM | POA: Diagnosis not present

## 2017-10-11 DIAGNOSIS — Z95811 Presence of heart assist device: Secondary | ICD-10-CM

## 2017-10-11 DIAGNOSIS — Z794 Long term (current) use of insulin: Secondary | ICD-10-CM | POA: Insufficient documentation

## 2017-10-11 DIAGNOSIS — Z79899 Other long term (current) drug therapy: Secondary | ICD-10-CM | POA: Insufficient documentation

## 2017-10-11 DIAGNOSIS — Z7982 Long term (current) use of aspirin: Secondary | ICD-10-CM | POA: Diagnosis not present

## 2017-10-11 DIAGNOSIS — I509 Heart failure, unspecified: Secondary | ICD-10-CM | POA: Diagnosis not present

## 2017-10-11 DIAGNOSIS — I82621 Acute embolism and thrombosis of deep veins of right upper extremity: Secondary | ICD-10-CM | POA: Diagnosis not present

## 2017-10-11 DIAGNOSIS — X58XXXA Exposure to other specified factors, initial encounter: Secondary | ICD-10-CM | POA: Diagnosis not present

## 2017-10-11 DIAGNOSIS — F329 Major depressive disorder, single episode, unspecified: Secondary | ICD-10-CM | POA: Diagnosis not present

## 2017-10-11 DIAGNOSIS — I5022 Chronic systolic (congestive) heart failure: Secondary | ICD-10-CM | POA: Insufficient documentation

## 2017-10-11 DIAGNOSIS — I2510 Atherosclerotic heart disease of native coronary artery without angina pectoris: Secondary | ICD-10-CM | POA: Insufficient documentation

## 2017-10-11 DIAGNOSIS — F419 Anxiety disorder, unspecified: Secondary | ICD-10-CM | POA: Diagnosis not present

## 2017-10-11 DIAGNOSIS — D509 Iron deficiency anemia, unspecified: Secondary | ICD-10-CM | POA: Insufficient documentation

## 2017-10-11 DIAGNOSIS — K319 Disease of stomach and duodenum, unspecified: Secondary | ICD-10-CM | POA: Diagnosis not present

## 2017-10-11 DIAGNOSIS — E871 Hypo-osmolality and hyponatremia: Secondary | ICD-10-CM | POA: Diagnosis not present

## 2017-10-11 DIAGNOSIS — Z7901 Long term (current) use of anticoagulants: Secondary | ICD-10-CM | POA: Insufficient documentation

## 2017-10-11 DIAGNOSIS — E119 Type 2 diabetes mellitus without complications: Secondary | ICD-10-CM | POA: Diagnosis not present

## 2017-10-11 DIAGNOSIS — S80921A Unspecified superficial injury of right lower leg, initial encounter: Secondary | ICD-10-CM | POA: Insufficient documentation

## 2017-10-11 DIAGNOSIS — J449 Chronic obstructive pulmonary disease, unspecified: Secondary | ICD-10-CM | POA: Insufficient documentation

## 2017-10-11 DIAGNOSIS — F1410 Cocaine abuse, uncomplicated: Secondary | ICD-10-CM | POA: Diagnosis not present

## 2017-10-11 DIAGNOSIS — I480 Paroxysmal atrial fibrillation: Secondary | ICD-10-CM | POA: Insufficient documentation

## 2017-10-11 DIAGNOSIS — Z951 Presence of aortocoronary bypass graft: Secondary | ICD-10-CM | POA: Diagnosis not present

## 2017-10-11 DIAGNOSIS — I50810 Right heart failure, unspecified: Secondary | ICD-10-CM

## 2017-10-11 LAB — COMPREHENSIVE METABOLIC PANEL
ALT: 17 U/L (ref 17–63)
ANION GAP: 9 (ref 5–15)
AST: 28 U/L (ref 15–41)
Albumin: 3.5 g/dL (ref 3.5–5.0)
Alkaline Phosphatase: 109 U/L (ref 38–126)
BUN: 24 mg/dL — ABNORMAL HIGH (ref 6–20)
CO2: 33 mmol/L — ABNORMAL HIGH (ref 22–32)
Calcium: 8.9 mg/dL (ref 8.9–10.3)
Chloride: 93 mmol/L — ABNORMAL LOW (ref 101–111)
Creatinine, Ser: 1.54 mg/dL — ABNORMAL HIGH (ref 0.61–1.24)
GFR, EST AFRICAN AMERICAN: 55 mL/min — AB (ref >60)
GFR, EST NON AFRICAN AMERICAN: 47 mL/min — AB (ref >60)
Glucose, Bld: 78 mg/dL (ref 65–99)
POTASSIUM: 4.2 mmol/L (ref 3.5–5.1)
SODIUM: 135 mmol/L (ref 135–145)
Total Bilirubin: 0.9 mg/dL (ref 0.3–1.2)
Total Protein: 7.3 g/dL (ref 6.5–8.1)

## 2017-10-11 LAB — CBC
HEMATOCRIT: 28.8 % — AB (ref 39.0–52.0)
HEMOGLOBIN: 8.1 g/dL — AB (ref 13.0–17.0)
MCH: 20.7 pg — ABNORMAL LOW (ref 26.0–34.0)
MCHC: 28.1 g/dL — ABNORMAL LOW (ref 30.0–36.0)
MCV: 73.7 fL — AB (ref 78.0–100.0)
PLATELETS: 193 10*3/uL (ref 150–400)
RBC: 3.91 MIL/uL — AB (ref 4.22–5.81)
RDW: 19.2 % — ABNORMAL HIGH (ref 11.5–15.5)
WBC: 7 10*3/uL (ref 4.0–10.5)

## 2017-10-11 LAB — PROTIME-INR
INR: 3.66
Prothrombin Time: 36.1 seconds — ABNORMAL HIGH (ref 11.4–15.2)

## 2017-10-11 LAB — LACTATE DEHYDROGENASE: LDH: 174 U/L (ref 98–192)

## 2017-10-11 NOTE — Progress Notes (Signed)
Patient presents for 1 month  follow up in [facility] today. Reports no problems with VAD equipment or concerns with drive line.   States he has been walking more and feels less short of breath lately. Has been attending cardiac rehab.  Patient asking when he can be referred back to [facility] for a heart transplant evaluation. Will discuss with Dr Haroldine Laws.   Vital Signs:  Doppler Pressure 90   Automatc BP: 117/73 (94) HR:78   SPO2:unable to obtain  %  Weight: 228.6 lb w/o eqt Last weight: 236.2 lb  VAD Indication: Destination Therapy- eval completed at [facility]    VAD interrogation & Equipment Management: Speed:5900 Flow: 5.7 Power:4.7 w    PI:2.4  Alarms: no clinical alarms Events: 12/30- no external power. Patient states he accidentally unplugged the MPU from the wall during the night.  Fixed speed 5900 Low speed limit: 5600  Primary Controller:  Replace back up battery in 26months. Back up controller:   Replace back up battery in 24 months.  Annual Equipment Maintenance on UBC/PM was performed on 09/06/2017.   I reviewed the LVAD parameters from today and compared the results to the patient's prior recorded data. LVAD interrogation was NEGATIVE for significant power changes, NEGATIVE for clinical alarms and STABLE for PI events/speed drops. No programming changes were made and pump is functioning within specified parameters. Pt is performing daily controller and system monitor self tests along with completing weekly and monthly maintenance for LVAD equipment.  LVAD equipment check completed and is in good working order. Back-up equipment present. Charged back up battery and performed self-test on equipment.   Exit Site Care: Drive line is being maintained weekly  by VAD Coordinators. Drive line exit site well healed and incorporated. The velour is fully implanted at exit site. Dressing dry and intact. No erythema or drainage. Stabilization device present and accurately  applied. Pt denies fever or chills. Pt states they have adequate dressing supplies at home.   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 Specifically denies melena/BRBPR or nosebleeds.  LDH stable at 174 with established baseline of 150- 250. Denies tea-colored urine. No power elevations noted on interrogation.   Plan: 1. Take another extra dose of torsemide tonight totalling 100 mg. Then resume dose of 100 mg in the am and 50 mg in the pm. 2. RTC in 1 week for inr/dsg 3. RTC in 1 month for VAD visit.  Balinda Quails RN Vance Coordinator   Office: [phone] 24/7 Emergency VAD Pager: [phone]

## 2017-10-11 NOTE — Progress Notes (Signed)
Advanced Heart Failure Clinic Note    Referring Provider: Darylene Price, FNP Primary Care: [facility] Primary Cardiologist: Dr Haroldine Laws.  Transplant Center- [facility]  HPI: Ronald Miller. is a 62 y.o. male with history of chronic systolic HF s/p Med[employer] ICD 2014, CAD s/p CABG in 2000, HTN, Hx of cocaine abuse, Tobacco abuse, Depression, PAF, and COPD. HM3 placed 09/09/2016. In April 2018 he was placed on milrinone but later switched to dobutamine. He remains on dobutamine 7.21mg.   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  06/01/2017- Capsule Endoscopy- normal except  tiny angiectasia, nonbleeding in the right colon.  Returns for LVAD follow up. Remains on home dobutamine and actually doing quite well. Says he is much more active and trying to wall everyday either in the mall or other places. Fees his breathing is much better. Also going to CR. Continues to struggle with volume overload and R-sided HF. Takes torsemide 100/50 but will often bump up to 100 bid. Still with ab bloating and occasional fatigue. Denies orthopnea or PND. No fevers, chills or problems with driveline. No bleeding, melena or neuro symptoms. No VAD alarms. Taking all meds as prescribed. Interested in being re-evaluated for transplant.    VAD Indication: [facility]   VAD interrogation & Equipment Management: Speed:5900 Flow: 5.7 Power:4.7 w PI:2.4  Alarms: no clinical alarms Events: 12/30- no external power. Patient states he accidentally unplugged the MPU from the wall during the night.  Fixed speed 5900 Low speed limit: 5600  Primary Controller: Replace back up battery in 280month Back up controller: Replace back up battery in 2451month Annual Equipment Maintenance on UBC/PM was performed   Past Medical History:  Diagnosis Date  . AICD (automatic  cardioverter/defibrillator) present   . ASCVD (arteriosclerotic cardiovascular disease)   . Chronic systolic CHF (congestive heart failure) ([place] . COPD (chronic obstructive pulmonary disease) ([place] . Coronary artery disease   . Depression   . Diabetes mellitus   . History of cocaine abuse   . Hypertension   . Presence of permanent cardiac pacemaker   . Shortness of breath dyspnea   . Suicidal ideation   . Tobacco abuse     Current Outpatient Medications  Medication Sig Dispense Refill  . acetaminophen (TYLENOL) 500 MG tablet Take 1,000 mg by mouth every 6 (six) hours as needed for moderate pain or headache.    . aMarland Kitcheniodarone (PACERONE) 200 MG tablet Take 1 tablet (200 mg total) by mouth daily. 30 tablet 6  . blood glucose meter kit and supplies KIT Dispense based on patient and insurance preference. Use up to four times daily as directed. (FOR ICD-9 250.00, 250.01). Please include strips and lancets. 1 each 0  . citalopram (CELEXA) 20 MG tablet Take 1 tablet (20 mg total) by mouth daily. 30 tablet 6  . digoxin (DIGOX) 0.125 MG tablet Take 1 tablet (0.125 mg total) daily by mouth. 30 tablet 6  . DOBUTamine (DOBUTREX) 4-5 MG/ML-% infusion Inject 571.8 mcg/min into the vein continuous. PER [facility]    . docusate sodium (COLACE) 100 MG capsule Take 200 mg by mouth at bedtime.     . gabapentin (NEURONTIN) 300 MG capsule Take 1 capsule (300 mg total) by mouth 2 (two) times daily. 180 capsule 6  . insulin aspart (NOVOLOG FLEXPEN) 100 UNIT/ML FlexPen Inject 5 Units into the skin 3 (  three) times daily with meals. 15 mL 11  . Insulin Glargine (LANTUS SOLOSTAR) 100 UNIT/ML Solostar Pen Inject 20 Units into the skin daily at 10 pm. 15 mL 11  . magnesium oxide (MAG-OX) 400 (241.3 Mg) MG tablet Take 1 tablet (400 mg total) by mouth daily. 30 tablet 6  . midodrine (PROAMATINE) 5 MG tablet Take 1 tablet (5 mg total) by mouth 3 (three) times daily with meals. 90 tablet 6  . Multiple Vitamin  (MULTIVITAMIN WITH MINERALS) TABS tablet Take 1 tablet by mouth daily.    . mupirocin ointment (BACTROBAN) 2 % Apply 1 application topically to leg blisters 2 times daily or as needed 22 g 6  . ondansetron (ZOFRAN ODT) 4 MG disintegrating tablet Take 1 tablet (4 mg total) by mouth every 8 (eight) hours as needed for nausea. 6 tablet 0  . pantoprazole (PROTONIX) 40 MG tablet Take 1 tablet (40 mg total) by mouth 2 (two) times daily before a meal. 60 tablet 6  . potassium chloride SA (K-DUR,KLOR-CON) 20 MEQ tablet Take 1 tablet (20 mEq total) by mouth 2 (two) times daily. 60 tablet 3  . senna-docusate (SENOKOT S) 8.6-50 MG tablet Take 2 tablets by mouth at bedtime as needed for mild constipation. 30 tablet 6  . spironolactone (ALDACTONE) 25 MG tablet Take 0.5 tablets (12.5 mg total) by mouth daily. 15 tablet 6  . sucralfate (CARAFATE) 1 GM/10ML suspension Take 10 mLs (1 g total) by mouth 4 (four) times daily -  with meals and at bedtime. 420 mL 6  . torsemide (DEMADEX) 100 MG tablet Take 143m in the morning and 535min the evening 90 tablet 6  . traMADol (ULTRAM) 50 MG tablet Take 1 tablet (50 mg total) by mouth 3 (three) times daily as needed for severe pain. 30 tablet 0  . warfarin (COUMADIN) 5 MG tablet Take 1 and 1/2 tablets (7.5 mg) daily except 1 tablet (5 mg) on Tuesday, Thursday and Saturday    . zolpidem (AMBIEN) 10 MG tablet Take 1 tablet (10 mg total) by mouth at bedtime as needed for sleep. 30 tablet 2   No current facility-administered medications for this encounter.     Codeine; Trazodone and nefazodone; Lipitor [atorvastatin]; and Tape  REVIEW OF SYSTEMS: All systems negative except as listed in HPI, PMH and Problem list.     Vitals:   10/11/17 1129  BP: 117/73  Pulse: 78  Resp: 16    Wt Readings from Last 3 Encounters:  10/11/17 228 lb 9.6 oz (103.7 kg)  09/06/17 236 lb (107 kg)  08/02/17 229 lb 9.6 oz (104.1 kg)    Vital Signs:  Doppler Pressure 90                  Automatc BP: 117/73 (94) HR:78  SPO2:unable to obtain  %  Weight: 228.6 lb w/o eqt Last weight: 236.2 lb  General:  NAD.  HEENT: normal  Neck: supple. JVP 8-9.  Carotids 2+ bilat; no bruits. No lymphadenopathy or thryomegaly appreciated. Cor: LVAD hum.  Lungs: Clear. Abdomen: obese soft, nontender, + distended. No hepatosplenomegaly. No bruits or masses. Good bowel sounds. Driveline site clean. Anchor in place.  Extremities: no cyanosis, clubbing, rash. Wrapped. Mild eema  Neuro: alert & oriented x 3. No focal deficits. Moves all 4 without problem      ASSESSMENT AND PLAN: 1. Chronic end-stage biventricular systolic HF: LVEF 1519%ue to ICM -> s/p Echo 08/24/16 LVEF 15%, RV mild dilated, moderately  reduced. 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  Has been turned down for [facility] at [facility].  - Complicated by RV failure. Failed milrinone due to persistent RV failure/shock..   - Remains on dobutamine 7.5 mcg.  - Improved NYHA II-III - Remains on midodrine 2.5 TID for low MAPs and possible cirrhotic physiology.  - Volume status much improved overall. Doing well with sliding scale diuretics - Continue torsemide 100/50 with increase to 100 bid as needed - Renal function stable. Creatinine 1.54 today - Continue sildenafil 40 tid.    - No beta blocker with RV failure.  - VAD interrogated personally. Parameters stable..  - Warfarin with goal INR 2.0 - 2.5 + ASA 81 daily. INR 3.66. Dicussed with PharmD  - Driveline looks good 2. CAD: Severe 3v- CAD s/p CABG with occluded grafts as above except for LIMA.  -No s/s ischemia. Continue current therapy.  3. DMII:  - Per PCP - HgbA1c has improved 4. Tobacco abuse:  - Reports complete cessation 5. Atrial fibrillation, paroxysmal - Remains in NSR.  Continue amio 200 bid 6. Left pleural effusion - underwent repeat thoracentesis in Jan. 2018.  - no recurrence. No effusion on exam today  7.  Anxiety/Depression - Continue Celexa 5m daily . - Social  issues have improved 8. RUE DVT.  - Continue warfarin.  9. Hyponatremia:  - Resolved. 135 today.  10. Anemia, iron-deficiency:   - Has had GI work up with EGD and Capsule Endoscopy showing severe gastropathy.Today hgb is 8.8.  Continue cararfate.  11. RLE- Partial thickness wound on the anterior aspect of RLE - Continues to heal. Continue compression wrap.   Total time spent 35 minutes. Over half that time spent discussing above.    DGlori Bickers MD  7:31 PM

## 2017-10-11 NOTE — Patient Instructions (Signed)
Take an extra torsemide 50mg  tonight then resume 100mg  in the morning and 50 mg in the evening.  You look so great today!

## 2017-10-12 ENCOUNTER — Ambulatory Visit: Payer: Self-pay | Admitting: Family Medicine

## 2017-10-13 ENCOUNTER — Other Ambulatory Visit (HOSPITAL_COMMUNITY): Payer: Self-pay | Admitting: *Deleted

## 2017-10-13 ENCOUNTER — Telehealth (HOSPITAL_COMMUNITY): Payer: Self-pay | Admitting: *Deleted

## 2017-10-13 DIAGNOSIS — D649 Anemia, unspecified: Secondary | ICD-10-CM

## 2017-10-13 DIAGNOSIS — Z95811 Presence of heart assist device: Secondary | ICD-10-CM

## 2017-10-13 DIAGNOSIS — E611 Iron deficiency: Secondary | ICD-10-CM

## 2017-10-13 MED ORDER — SODIUM CHLORIDE 0.9 % IV SOLN: 510.0000 mg | Freq: Once | INTRAVENOUS | Status: DC

## 2017-10-13 NOTE — Telephone Encounter (Signed)
Called to inform patient he has been scheduled to have an IV Feraheme infusion on Tuesday January 15th after his VAD clinic visit in short stay.  Balinda Quails RN, VAD Coordinator 24/7 pager [phone]

## 2017-10-17 ENCOUNTER — Other Ambulatory Visit (HOSPITAL_COMMUNITY): Payer: Self-pay | Admitting: *Deleted

## 2017-10-17 DIAGNOSIS — Z7901 Long term (current) use of anticoagulants: Secondary | ICD-10-CM

## 2017-10-17 DIAGNOSIS — I5043 Acute on chronic combined systolic (congestive) and diastolic (congestive) heart failure: Secondary | ICD-10-CM

## 2017-10-17 DIAGNOSIS — Z95811 Presence of heart assist device: Secondary | ICD-10-CM

## 2017-10-18 ENCOUNTER — Ambulatory Visit (INDEPENDENT_AMBULATORY_CARE_PROVIDER_SITE_OTHER): Payer: Medicare HMO | Admitting: Family Medicine

## 2017-10-18 ENCOUNTER — Ambulatory Visit (HOSPITAL_COMMUNITY): Payer: Self-pay | Admitting: Pharmacist

## 2017-10-18 ENCOUNTER — Encounter: Payer: Self-pay | Admitting: Family Medicine

## 2017-10-18 ENCOUNTER — Ambulatory Visit (HOSPITAL_COMMUNITY)
Admission: RE | Admit: 2017-10-18 | Discharge: 2017-10-18 | Disposition: A | Discharge status: Home / Self Care | Payer: Medicare HMO | Source: Ambulatory Visit | Attending: Cardiology | Admitting: Cardiology

## 2017-10-18 ENCOUNTER — Ambulatory Visit (HOSPITAL_COMMUNITY)
Admission: RE | Admit: 2017-10-18 | Discharge: 2017-10-18 | Disposition: A | Discharge status: Home / Self Care | Payer: Medicare HMO | Source: Ambulatory Visit | Attending: Internal Medicine | Admitting: Internal Medicine

## 2017-10-18 VITALS — HR 51 | Temp 97.7°F | Wt 237.4 lb

## 2017-10-18 DIAGNOSIS — Z95811 Presence of heart assist device: Secondary | ICD-10-CM | POA: Insufficient documentation

## 2017-10-18 DIAGNOSIS — D649 Anemia, unspecified: Secondary | ICD-10-CM | POA: Insufficient documentation

## 2017-10-18 DIAGNOSIS — Z4801 Encounter for change or removal of surgical wound dressing: Secondary | ICD-10-CM | POA: Insufficient documentation

## 2017-10-18 DIAGNOSIS — F419 Anxiety disorder, unspecified: Secondary | ICD-10-CM | POA: Diagnosis not present

## 2017-10-18 DIAGNOSIS — E611 Iron deficiency: Secondary | ICD-10-CM | POA: Diagnosis not present

## 2017-10-18 DIAGNOSIS — Z794 Long term (current) use of insulin: Secondary | ICD-10-CM | POA: Diagnosis not present

## 2017-10-18 DIAGNOSIS — I5043 Acute on chronic combined systolic (congestive) and diastolic (congestive) heart failure: Secondary | ICD-10-CM

## 2017-10-18 DIAGNOSIS — R040 Epistaxis: Secondary | ICD-10-CM | POA: Diagnosis not present

## 2017-10-18 DIAGNOSIS — E1169 Type 2 diabetes mellitus with other specified complication: Secondary | ICD-10-CM | POA: Diagnosis not present

## 2017-10-18 DIAGNOSIS — Z7901 Long term (current) use of anticoagulants: Secondary | ICD-10-CM

## 2017-10-18 LAB — CBC
HCT: 27.4 % — ABNORMAL LOW (ref 39.0–52.0)
HEMOGLOBIN: 7.6 g/dL — AB (ref 13.0–17.0)
MCH: 20.1 pg — AB (ref 26.0–34.0)
MCHC: 27.7 g/dL — AB (ref 30.0–36.0)
MCV: 72.5 fL — ABNORMAL LOW (ref 78.0–100.0)
PLATELETS: 194 10*3/uL (ref 150–400)
RBC: 3.78 MIL/uL — ABNORMAL LOW (ref 4.22–5.81)
RDW: 19.3 % — AB (ref 11.5–15.5)
WBC: 5.9 10*3/uL (ref 4.0–10.5)

## 2017-10-18 LAB — PROTIME-INR
INR: 2.9
PROTHROMBIN TIME: 30.1 s — AB (ref 11.4–15.2)

## 2017-10-18 LAB — VITAMIN B12: Vitamin B-12: 510 pg/mL (ref 180–914)

## 2017-10-18 LAB — IRON AND TIBC
IRON: 16 ug/dL — AB (ref 45–182)
Saturation Ratios: 4 % — ABNORMAL LOW (ref 17.9–39.5)
TIBC: 398 ug/dL (ref 250–450)
UIBC: 382 ug/dL

## 2017-10-18 LAB — FOLATE: Folate: 18.3 ng/mL (ref >5.9)

## 2017-10-18 LAB — POCT GLYCOSYLATED HEMOGLOBIN (HGB A1C): HEMOGLOBIN A1C: 6.3

## 2017-10-18 LAB — FERRITIN: FERRITIN: 15 ng/mL — AB (ref 24–336)

## 2017-10-18 MED ORDER — FERUMOXYTOL INJECTION 510 MG/17 ML
510.0000 mg | Freq: Once | INTRAVENOUS | Status: AC
  Administered 2017-10-18: 510 mg via INTRAVENOUS
  Filled 2017-10-18: qty 17

## 2017-10-18 NOTE — Assessment & Plan Note (Signed)
A1c is very well controlled.  He will continue his current regimen for now.

## 2017-10-18 NOTE — Discharge Instructions (Signed)

## 2017-10-18 NOTE — Progress Notes (Signed)
Labs with hemoglobin noted from earlier today being 7.6.  I spoke with the advanced heart failure clinic and they advised they are going to discuss it with the LVAD nurse who would then discuss it with Dr. Haroldine Laws.  I then paged the LVAD nurse and discussed it with her.  She noted she would get in touch with Dr. Haroldine Laws to come up with a plan for the patient and it might include outpatient transfusion and short stay.  He received an iron infusion today.  Patient symptoms are at baseline.  I discussed this with the patient and advised he should expect a call from them.  If he does not get a call he needs to call their office.

## 2017-10-18 NOTE — Assessment & Plan Note (Signed)
Well-controlled currently on Celexa.  I offered referral to a therapist though he deferred this at this time.  He will continue to monitor.

## 2017-10-18 NOTE — Addendum Note (Signed)
Encounter addended by: Candy Sledge, RN on: 10/18/2017 10:59 AM  Actions taken: Sign clinical note

## 2017-10-18 NOTE — Patient Instructions (Addendum)
Nice to see you.  You should get a call from the heart failure clinic regarding possible treatment. We will get you to see ENT. We will call you with your A1c results. If you develop worsening shortness of breath or you develop any lightheadedness or chest pain please be evaluated immediately.

## 2017-10-18 NOTE — Assessment & Plan Note (Signed)
Patient with chronic anemia slightly worsening now less than 8.  Concern given his iron deficiency is for chronic blood loss.  Potentially related to nosebleeds or other cause.  This lab was drawn earlier today through the heart failure clinic at [facility].  I contacted the clinic to inform them of this and also spoke with the LVAD nurse coordinator as outlined in my previously documented note.  They noted that they would contact the patient after speaking with the patient's heart failure specialist and determine the next step in management.  The patient appears to be at his baseline currently though may benefit from transfusion which they stated they would arrange if needed.  They advised that he would be okay to leave the office at this time and they would be in touch with him.  I discussed this with the patient and gave him return precautions.

## 2017-10-18 NOTE — Progress Notes (Signed)
Patient presents to clinic today for drive line exit wound care. Existing VAD dressing removed and site care performed using sterile technique. Drive line exit site cleaned with Chlora prep applicators x 2, allowed to dry, and Sorbaview dressing with bio patch re-applied. Exit site healed and incorporated, the velour is fully implanted at exit site. No redness, tenderness, drainage, foul odor or rash noted. Drive line anchor re-applied. Pt denies fever or chills.   Return in 1 week for another dressing change per standard of care. INR to be repeated in 1 week pending results per anticoagulation protocol.   Lesley Wilson RN, VAD Coordinator 24/7 pager [phone]  

## 2017-10-18 NOTE — Assessment & Plan Note (Signed)
Deemed to be a recurrent issue.  No active bleeding now.  Discussed referring him back to ENT for evaluation though he declined.

## 2017-10-18 NOTE — Progress Notes (Signed)
Ronald Rumps, MD Phone: [phone]  Ronald Miller Murle Miller. is a 62 y.o. male who presents today for follow-up.  Diabetes: Notes it was 200 this morning.  He is taking 10 units of Lantus and 5 units of NovoLog 3 times daily.  He has been eating a fair number fruits.  Does note some polyuria.  He notes he is drinking more fluid than he should given his heart failure.  Patient has heart failure and has an LVAD in place.  He is followed by the advanced heart failure clinic at [facility].  He notes chronic dyspnea that is stable and not more than usual.  He notes no chest pain.  Occasionally lightheaded if he is too active.  He has been taking his medication and using his dobutamine.  He had lab work at the advanced heart failure clinic earlier today.  They have been adjusting his Coumadin for his INR.  His hemoglobin was found to be 7.6 today.  He reports he has not been contacted regarding this yet.  Suspect weight in our office is aberrant given stable weight earlier today.  Patient does report intermittent nosebleeds particularly from the right side of his nose.  Sometimes they occur daily.  Sometimes drains on the back of his throat.  He will Place Vaseline on a Q-tip and place it in his nose to help get it to stop.  He will occasionally pinch his nose and place ice on it.  He had it packed by ENT last year and notes he will never go to see ENT again.  He does note some anxiety though no depression.  He is on Celexa.  He is unsure if he would like to see a therapist.  Social History   Tobacco Use  Smoking Status Former Smoker  . Packs/day: 1.50  . Years: 23.00  . Pack years: 34.50  . Types: Cigarettes  . Start date: 10/24/1992  . Last attempt to quit: 07/01/2016  . Years since quitting: 1.2  Smokeless Tobacco Never Used     ROS see history of present illness  Objective  Physical Exam Vitals:   10/18/17 1533  Pulse: (!) 51  Temp: 97.7 F (36.5 C)  SpO2: 92%  Of note we are  unable to get a blood pressure on this patient given that he has an LVAD.  BP Readings from Last 3 Encounters:  10/18/17 94/81  10/11/17 117/73  09/06/17 (!) 84/0   Wt Readings from Last 3 Encounters:  10/18/17 237 lb 6.4 oz (107.7 kg)  10/18/17 227 lb (103 kg)  10/11/17 228 lb 9.6 oz (103.7 kg)    Physical Exam  Constitutional: No distress.  HENT:  Head: Normocephalic and atraumatic.  Nasal mucosa mildly erythematous though no specific areas of bleeding noted bilaterally  Cardiovascular:  LVAD hum noted  Pulmonary/Chest: Effort normal and breath sounds normal.  Neurological: He is alert. Gait normal.  Skin: Skin is warm and dry. He is not diaphoretic.     Assessment/Plan: Please see individual problem list.  Type 2 diabetes mellitus (HCC) A1c is very well controlled.  He will continue his current regimen for now.  Anemia Patient with chronic anemia slightly worsening now less than 8.  Concern given his iron deficiency is for chronic blood loss.  Potentially related to nosebleeds or other cause.  This lab was drawn earlier today through the heart failure clinic at [facility].  I contacted the clinic to inform them of this and also spoke with  the LVAD nurse coordinator as outlined in my previously documented note.  They noted that they would contact the patient after speaking with the patient's heart failure specialist and determine the next step in management.  The patient appears to be at his baseline currently though may benefit from transfusion which they stated they would arrange if needed.  They advised that he would be okay to leave the office at this time and they would be in touch with him.  I discussed this with the patient and gave him return precautions.  Anxiety Well-controlled currently on Celexa.  I offered referral to a therapist though he deferred this at this time.  He will continue to monitor.  Epistaxis Deemed to be a recurrent issue.  No active bleeding now.   Discussed referring him back to ENT for evaluation though he declined.   Orders Placed This Encounter  Procedures  . POCT HgB A1C    No orders of the defined types were placed in this encounter.    Ronald Rumps, MD [place]

## 2017-10-19 ENCOUNTER — Encounter (HOSPITAL_COMMUNITY): Payer: Self-pay | Admitting: *Deleted

## 2017-10-19 NOTE — Progress Notes (Signed)
Called patient regarding his labs today (CBC) specifically to verify there are no signs of active bleeding. He reiterates he has not had any signs of bleeding. Will schedule patient for repeat CBC next week with a type and screen per Dr Haroldine Laws.  Balinda Quails RN, VAD Coordinator 24/7 pager [phone]

## 2017-10-20 ENCOUNTER — Telehealth (HOSPITAL_COMMUNITY): Payer: Self-pay | Admitting: Unknown Physician Specialty

## 2017-10-20 ENCOUNTER — Telehealth: Payer: Self-pay

## 2017-10-20 DIAGNOSIS — B351 Tinea unguium: Secondary | ICD-10-CM

## 2017-10-20 NOTE — Telephone Encounter (Signed)
Copied from [place] [id number]. Topic: Referral - Request >> Oct 20, 2017 10:53 AM Bea Graff, NT wrote: Reason for CRM: Pt requesting a referral to podiatrist.

## 2017-10-20 NOTE — Addendum Note (Signed)
Addended by: Caryl Bis, Ayde Record G on: 10/20/2017 11:26 AM   Modules accepted: Orders

## 2017-10-20 NOTE — Telephone Encounter (Signed)
Received a call from pt and [place] stating that pts wt is up to 229lbs. Pt is more SOB. Nurse states that the pt went to [employer] yesterday and ate a couple of chicken sandwiches. Pt will take an extra dose of Torsemide 50 mg tonight. Pt is also c/o of irritation around his drive line anchor. Pt states that he feels like wrong anchor was put on. Pt was scheduled for a clinic visit tomorrow. Pt informed that he feels better in the morning after his torsemide to please call and cancel his appt and we will see him at his reguarly scheduled appt on Tuesday. Pt verbalized understanding of these instructions.  Tanda Rockers RN, BSN VAD Coordinator 24/7 Pager [phone]

## 2017-10-20 NOTE — Telephone Encounter (Signed)
Referral placed.

## 2017-10-20 NOTE — Telephone Encounter (Signed)
Please advise about referral 

## 2017-10-20 NOTE — Telephone Encounter (Signed)
Patient stated that his toenails are becoming very hard he is worried about his toes due to him being a diabetic.  He can not cut his toenails. Please advise.

## 2017-10-21 ENCOUNTER — Encounter (HOSPITAL_COMMUNITY): Payer: Self-pay

## 2017-10-21 ENCOUNTER — Other Ambulatory Visit (HOSPITAL_COMMUNITY): Payer: Self-pay | Admitting: *Deleted

## 2017-10-21 ENCOUNTER — Telehealth (HOSPITAL_COMMUNITY): Payer: Self-pay | Admitting: *Deleted

## 2017-10-21 DIAGNOSIS — Z95811 Presence of heart assist device: Secondary | ICD-10-CM

## 2017-10-21 IMAGING — US US PARACENTESIS
1 series · 3 of 3 positions shown · non-contrast
Comparison: none

INDICATION: Abdominal ascites and abdominal distension.

[Series 1: us paracentesis · 0.25mm/px · 3 of 3 slices shown]
[im 1/3]
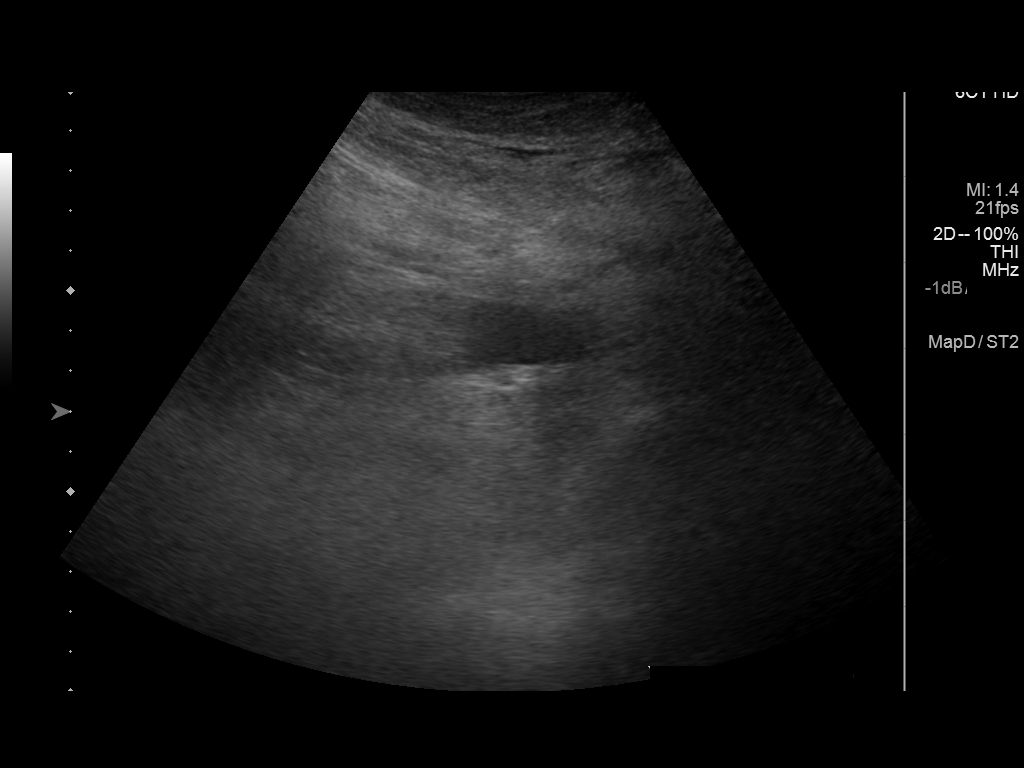
[im 2/3]
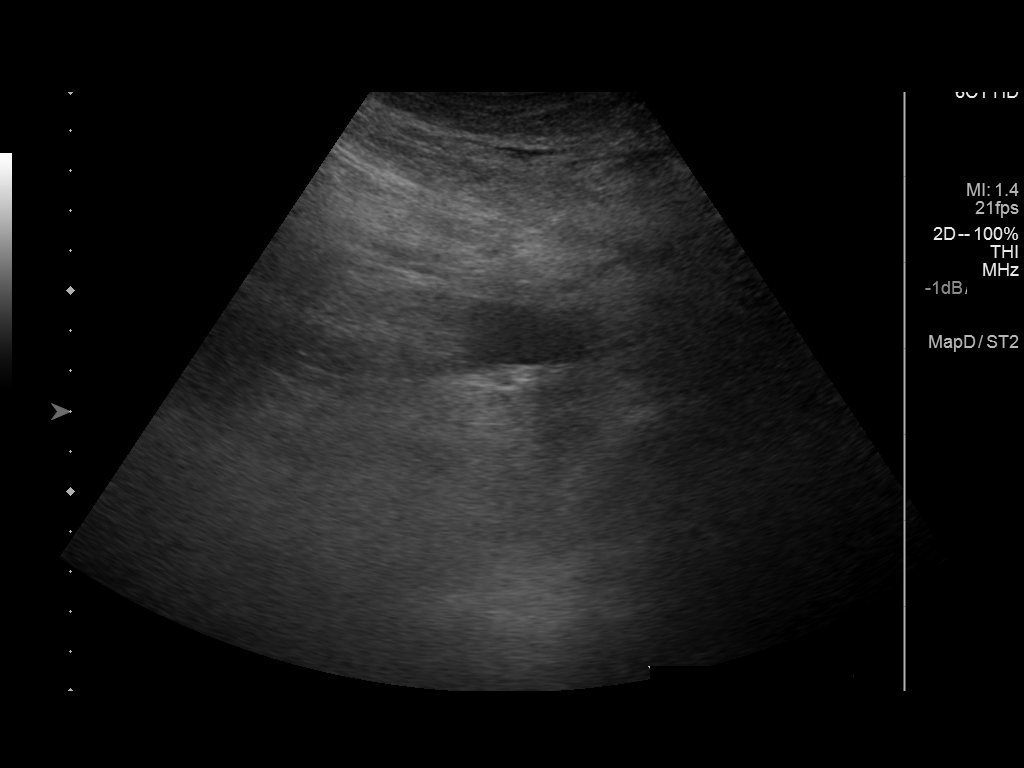
[im 3/3]
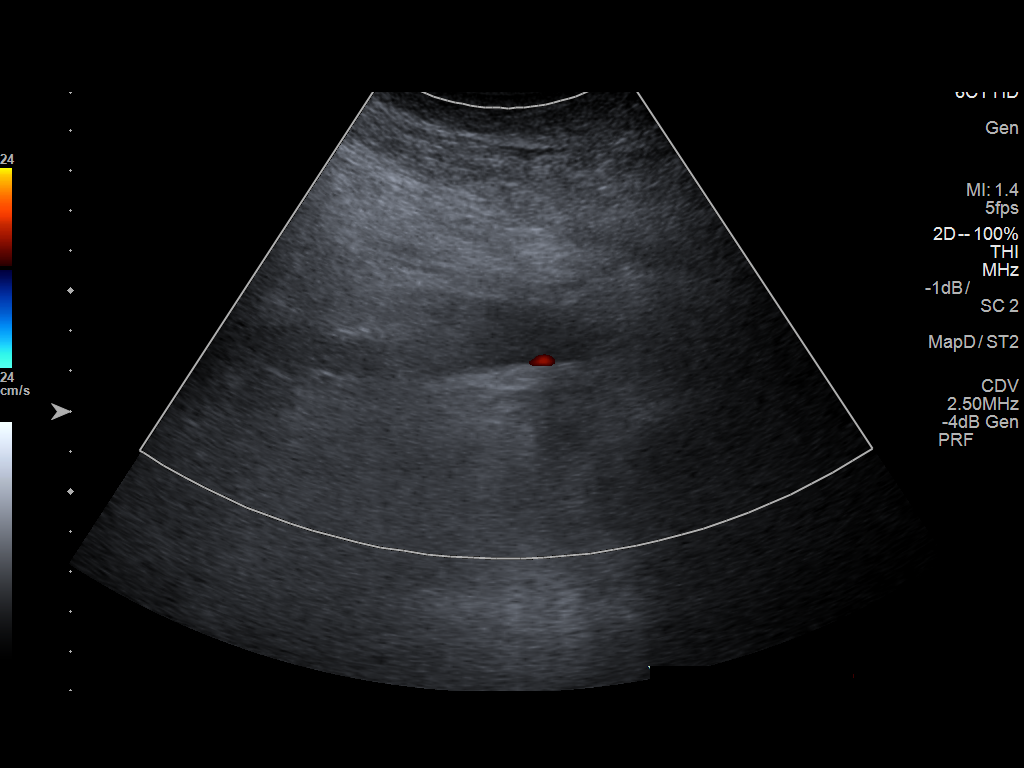

[3 of 3 positions shown; findings below may reference images not displayed]

EXAM:
ULTRASOUND GUIDED PARACENTESIS

MEDICATIONS:
None.

COMPLICATIONS:
None immediate.

PROCEDURE:
Informed written consent was obtained from the patient after a
discussion of the risks, benefits and alternatives to treatment. A
timeout was performed prior to the initiation of the procedure.

Initial ultrasound scanning demonstrates ascites within the right
lower abdominal quadrant. The right lower abdomen was prepped and
draped in the usual sterile fashion. 1% lidocaine with epinephrine
was used for local anesthesia.

Following this, a 6 Fr Safe-T-Centesis catheter was introduced. An
ultrasound image was saved for documentation purposes. The
paracentesis was performed. The catheter was removed and a dressing
was applied. The patient tolerated the procedure well without
immediate post procedural complication.
FINDINGS: A total of approximately 7.2 L of yellow fluid was removed.
IMPRESSION: Successful ultrasound-guided paracentesis yielding 7.2 L liters of
peritoneal fluid.

## 2017-10-21 NOTE — Telephone Encounter (Signed)
Received telephone call from patient stating he does not think he needs to come to clinic today. His weight is still 229 lbs. He plans to take and extra 50 mg of Torsemide tonight. States he will page the VAD coordinator if his plans change or he has any developing symptoms. He will return to clinic Tuesday at his previously scheduled time.  Balinda Quails RN, VAD Coordinator 24/7 pager [phone]

## 2017-10-24 ENCOUNTER — Telehealth (HOSPITAL_COMMUNITY): Payer: Self-pay | Admitting: *Deleted

## 2017-10-24 NOTE — Telephone Encounter (Signed)
[facility] nurse Judson Roch) called VAD office to report pt's weight is 231 today (was 229 lbs last visit). She reports he took extra dose of Torsemide last week and wonders if he should take one today. Pt has nurse visit tomorrow in [place] clinic, will assess weight and VAD parameters at that time.

## 2017-10-24 NOTE — Telephone Encounter (Signed)
Opened in error

## 2017-10-25 ENCOUNTER — Other Ambulatory Visit (HOSPITAL_COMMUNITY): Payer: Self-pay

## 2017-10-26 ENCOUNTER — Ambulatory Visit (HOSPITAL_COMMUNITY)
Admission: RE | Admit: 2017-10-26 | Discharge: 2017-10-26 | Disposition: A | Discharge status: Home / Self Care | Payer: Medicare HMO | Source: Ambulatory Visit | Attending: Internal Medicine | Admitting: Internal Medicine

## 2017-10-26 ENCOUNTER — Other Ambulatory Visit (HOSPITAL_COMMUNITY): Payer: Self-pay | Admitting: *Deleted

## 2017-10-26 ENCOUNTER — Ambulatory Visit (HOSPITAL_COMMUNITY): Payer: Self-pay | Admitting: Pharmacist

## 2017-10-26 ENCOUNTER — Other Ambulatory Visit: Payer: Self-pay | Admitting: *Deleted

## 2017-10-26 DIAGNOSIS — R918 Other nonspecific abnormal finding of lung field: Secondary | ICD-10-CM | POA: Insufficient documentation

## 2017-10-26 DIAGNOSIS — J90 Pleural effusion, not elsewhere classified: Secondary | ICD-10-CM | POA: Insufficient documentation

## 2017-10-26 DIAGNOSIS — T82898A Other specified complication of vascular prosthetic devices, implants and grafts, initial encounter: Secondary | ICD-10-CM | POA: Insufficient documentation

## 2017-10-26 DIAGNOSIS — I878 Other specified disorders of veins: Secondary | ICD-10-CM | POA: Diagnosis not present

## 2017-10-26 DIAGNOSIS — Y838 Other surgical procedures as the cause of abnormal reaction of the patient, or of later complication, without mention of misadventure at the time of the procedure: Secondary | ICD-10-CM | POA: Insufficient documentation

## 2017-10-26 DIAGNOSIS — Z95810 Presence of automatic (implantable) cardiac defibrillator: Secondary | ICD-10-CM | POA: Diagnosis not present

## 2017-10-26 DIAGNOSIS — Z95811 Presence of heart assist device: Secondary | ICD-10-CM | POA: Insufficient documentation

## 2017-10-26 DIAGNOSIS — I517 Cardiomegaly: Secondary | ICD-10-CM | POA: Diagnosis not present

## 2017-10-26 LAB — CBC
HCT: 31 % — ABNORMAL LOW (ref 39.0–52.0)
Hemoglobin: 9 g/dL — ABNORMAL LOW (ref 13.0–17.0)
MCH: 22.4 pg — ABNORMAL LOW (ref 26.0–34.0)
MCHC: 29 g/dL — ABNORMAL LOW (ref 30.0–36.0)
MCV: 77.1 fL — ABNORMAL LOW (ref 78.0–100.0)
Platelets: 199 K/uL (ref 150–400)
RBC: 4.02 MIL/uL — ABNORMAL LOW (ref 4.22–5.81)
RDW: 23.9 % — ABNORMAL HIGH (ref 11.5–15.5)
WBC: 5.9 K/uL (ref 4.0–10.5)

## 2017-10-26 LAB — PROTIME-INR
INR: 2.35
PROTHROMBIN TIME: 25.5 s — AB (ref 11.4–15.2)

## 2017-10-26 LAB — TYPE AND SCREEN
ABO/RH(D): O POS
Antibody Screen: NEGATIVE

## 2017-10-26 NOTE — Progress Notes (Signed)
Patient presents to clinic today for drive line exit wound care. Existing VAD dressing removed and site care performed using sterile technique. Drive line exit site cleaned with betadine swabs x 2, allowed to dry, and Sorbaview dressing with bio patch re-applied. Exit site healed and incorporated, the velour is fully implanted at exit site. No redness, tenderness, drainage, foul odor or rash noted. Drive line anchor re-applied. Pt denies fever or chills.   Labs drawn today including T&C for blood if Hgb down from last week. Hgb today is 9.0; no transfusion needed.   Pt noticed his dobutamine is leaking from his IV tubing; tubing inspected and found to be broken. Taped for now. Contacted his home [facility] nurse (Sarah) and Carolynn Sayers, [facility] hospital nurse. Pam will come here and change tubing.  Pt going for CXR today to check PICC line placement. If ok, Dr. Haroldine Laws wants PICC line port that isn't working to be flushed with TPA. Per Carolynn Sayers, pt will need to have done here due to no insurance coverage for home TPA. Notified short stay and first available is Friday at 1 pm - pt notified.   Return in 1 week for another dressing change per standard of care. INR to be repeated in 1-2 weeks pending results per anticoagulation protocol.   Zada Girt RN, VAD Coordinator 24/7 pager [phone]

## 2017-10-26 NOTE — Addendum Note (Signed)
Encounter addended by: Lezlie Octave, RN on: 10/26/2017 11:56 AM  Actions taken: Sign clinical note

## 2017-10-26 NOTE — Telephone Encounter (Signed)
Ronald Miller also reports pt has "pulled one stitch out from PICC line, but one stitch remains in place". She is getting "high pressure alarms" on port that has dobutamine. The other port is "difficult to draw back and flush from", but reports it has been this way for last month. She says pt will not flush empty port daily as instructed. She will switch port and call VAD pager if continued problems with dobutamine continuous gtt.  Dr. Haroldine Miller updated - will get CXR at visit tomorrow to visualize PICC line.

## 2017-10-28 ENCOUNTER — Other Ambulatory Visit (HOSPITAL_COMMUNITY): Payer: Self-pay | Admitting: Cardiology

## 2017-10-28 ENCOUNTER — Encounter (HOSPITAL_COMMUNITY)
Admission: RE | Admit: 2017-10-28 | Discharge: 2017-10-28 | Disposition: A | Discharge status: Home / Self Care | Payer: Medicare HMO | Source: Ambulatory Visit | Attending: Internal Medicine | Admitting: Internal Medicine

## 2017-10-28 ENCOUNTER — Other Ambulatory Visit (HOSPITAL_COMMUNITY): Payer: Self-pay | Admitting: Unknown Physician Specialty

## 2017-10-28 DIAGNOSIS — Z7901 Long term (current) use of anticoagulants: Secondary | ICD-10-CM | POA: Insufficient documentation

## 2017-10-28 DIAGNOSIS — I5022 Chronic systolic (congestive) heart failure: Secondary | ICD-10-CM

## 2017-10-28 DIAGNOSIS — Z95811 Presence of heart assist device: Secondary | ICD-10-CM

## 2017-10-28 DIAGNOSIS — T82898A Other specified complication of vascular prosthetic devices, implants and grafts, initial encounter: Secondary | ICD-10-CM

## 2017-10-28 MED ORDER — ALTEPLASE 2 MG IJ SOLR: 2.0000 mg | Freq: Once | INTRAMUSCULAR | Status: DC

## 2017-10-31 ENCOUNTER — Telehealth (HOSPITAL_COMMUNITY): Payer: Self-pay | Admitting: Unknown Physician Specialty

## 2017-10-31 DIAGNOSIS — I5022 Chronic systolic (congestive) heart failure: Secondary | ICD-10-CM

## 2017-10-31 MED ORDER — AMIODARONE HCL 200 MG PO TABS: 200.0000 mg | ORAL_TABLET | Freq: Every day | ORAL | 6 refills | Status: DC

## 2017-10-31 NOTE — Telephone Encounter (Signed)
Received call from pts [place] today stating that over the weekend the pt had [place] and his weight is up to 234 today from 226 at her last visit. Pt will take an extra 50 mg of Torsemide this evening. I had a lengthy discussion with the pt and his nurse regarding his food compliance and the consequences of taking extra fluid pills r/t his kidneys. Pt verbalized understanding of this and says that it was a special occasion as they were celebrating his birthday which is tomorrow. Pt is also requesting a refill for his amiodarone. We will provide this today. Pt is also inquiring about the possibility of another transplant referral. We will bring this up in our [facility] meeting this evening. Pt was instructed to call in the morning if he has not lost any weight. We will f/u with pt on Thursday this week.  Tanda Rockers RN, BSN VAD Coordinator 24/7 Pager [phone]

## 2017-11-01 ENCOUNTER — Telehealth (HOSPITAL_COMMUNITY): Payer: Self-pay | Admitting: *Deleted

## 2017-11-01 NOTE — Telephone Encounter (Signed)
Received call from patient stating he was down 2 lbs today. He was asked to report this.  Balinda Quails RN, VAD Coordinator 24/7 pager [phone]

## 2017-11-03 ENCOUNTER — Ambulatory Visit (HOSPITAL_COMMUNITY)
Admission: RE | Admit: 2017-11-03 | Discharge: 2017-11-03 | Disposition: A | Discharge status: Home / Self Care | Payer: Medicare HMO | Source: Ambulatory Visit | Attending: Cardiology | Admitting: Cardiology

## 2017-11-03 ENCOUNTER — Ambulatory Visit (HOSPITAL_COMMUNITY): Payer: Self-pay | Admitting: Pharmacist

## 2017-11-03 DIAGNOSIS — Z7901 Long term (current) use of anticoagulants: Secondary | ICD-10-CM | POA: Diagnosis not present

## 2017-11-03 DIAGNOSIS — Z95811 Presence of heart assist device: Secondary | ICD-10-CM | POA: Diagnosis not present

## 2017-11-03 LAB — PROTIME-INR
INR: 2.07
PROTHROMBIN TIME: 23.2 s — AB (ref 11.4–15.2)

## 2017-11-03 NOTE — Addendum Note (Signed)
Encounter addended by: Candy Sledge, RN on: 11/03/2017 2:29 PM  Actions taken: Sign clinical note

## 2017-11-03 NOTE — Progress Notes (Signed)
Patient presents to clinic today for drive line exit wound care. Existing VAD dressing removed and site care performed using sterile technique. Drive line exit site cleaned with betadine applicator, allowed to dry, and Sorbaview dressing with bio patch re-applied. Exit site healed and incorporated, the velour is fully implanted at exit site. No redness, tenderness, drainage, foul odor or rash noted. Drive line anchor re-applied. Pt denies fever or chills.   Return in 1 week for another dressing change per standard of care. INR to be repeated in 1-2 weeks pending results per anticoagulation protocol.   Balinda Quails RN, VAD Coordinator 24/7 pager [phone]

## 2017-11-04 ENCOUNTER — Other Ambulatory Visit (HOSPITAL_COMMUNITY): Payer: Self-pay | Admitting: Pharmacist

## 2017-11-04 MED ORDER — WARFARIN SODIUM 5 MG PO TABS: ORAL_TABLET | ORAL | 5 refills | Status: DC

## 2017-11-06 ENCOUNTER — Telehealth (HOSPITAL_COMMUNITY): Payer: Self-pay | Admitting: *Deleted

## 2017-11-07 ENCOUNTER — Other Ambulatory Visit (HOSPITAL_COMMUNITY): Payer: Self-pay | Admitting: *Deleted

## 2017-11-07 DIAGNOSIS — Z95811 Presence of heart assist device: Secondary | ICD-10-CM

## 2017-11-07 NOTE — Telephone Encounter (Signed)
Pt called VAD pager to report his "fluid and weight is up" and he is "hardly able to walk". VAD parameters unchanged, denies VAD alarms. Pt reports he takes Torsemide 100 mg am and 50 mg evening.   Dr. Haroldine Laws updated - instructed pt to double his Torsemide today (200 mg am and 100 mg pm) and take one metolazone today. Pt verbalized understanding of same.

## 2017-11-09 ENCOUNTER — Ambulatory Visit (HOSPITAL_COMMUNITY)
Admission: RE | Admit: 2017-11-09 | Discharge: 2017-11-09 | Disposition: A | Discharge status: Home / Self Care | Payer: Medicare HMO | Source: Ambulatory Visit | Attending: Internal Medicine | Admitting: Internal Medicine

## 2017-11-09 ENCOUNTER — Inpatient Hospital Stay (HOSPITAL_COMMUNITY)
Admission: AD | Admit: 2017-11-09 | Discharge: 2017-11-21 | DRG: 292 | Disposition: A | Discharge status: Home Health | Payer: Medicare HMO | Source: Ambulatory Visit | Attending: Internal Medicine | Admitting: Internal Medicine

## 2017-11-09 ENCOUNTER — Inpatient Hospital Stay (HOSPITAL_COMMUNITY): Payer: Medicare HMO

## 2017-11-09 ENCOUNTER — Encounter (HOSPITAL_COMMUNITY): Payer: Self-pay

## 2017-11-09 VITALS — BP 126/45 | HR 126 | Resp 12 | Ht 69.0 in | Wt 239.4 lb

## 2017-11-09 DIAGNOSIS — E871 Hypo-osmolality and hyponatremia: Secondary | ICD-10-CM | POA: Diagnosis not present

## 2017-11-09 DIAGNOSIS — Z79899 Other long term (current) drug therapy: Secondary | ICD-10-CM | POA: Diagnosis not present

## 2017-11-09 DIAGNOSIS — K7290 Hepatic failure, unspecified without coma: Secondary | ICD-10-CM | POA: Diagnosis present

## 2017-11-09 DIAGNOSIS — I5082 Biventricular heart failure: Secondary | ICD-10-CM | POA: Diagnosis present

## 2017-11-09 DIAGNOSIS — I509 Heart failure, unspecified: Secondary | ICD-10-CM | POA: Diagnosis not present

## 2017-11-09 DIAGNOSIS — K761 Chronic passive congestion of liver: Secondary | ICD-10-CM | POA: Diagnosis present

## 2017-11-09 DIAGNOSIS — Z794 Long term (current) use of insulin: Secondary | ICD-10-CM

## 2017-11-09 DIAGNOSIS — Z951 Presence of aortocoronary bypass graft: Secondary | ICD-10-CM

## 2017-11-09 DIAGNOSIS — I5043 Acute on chronic combined systolic (congestive) and diastolic (congestive) heart failure: Secondary | ICD-10-CM | POA: Diagnosis present

## 2017-11-09 DIAGNOSIS — I5084 End stage heart failure: Secondary | ICD-10-CM | POA: Diagnosis present

## 2017-11-09 DIAGNOSIS — E7220 Disorder of urea cycle metabolism, unspecified: Secondary | ICD-10-CM | POA: Diagnosis present

## 2017-11-09 DIAGNOSIS — Z888 Allergy status to other drugs, medicaments and biological substances status: Secondary | ICD-10-CM

## 2017-11-09 DIAGNOSIS — Z95811 Presence of heart assist device: Secondary | ICD-10-CM

## 2017-11-09 DIAGNOSIS — D509 Iron deficiency anemia, unspecified: Secondary | ICD-10-CM | POA: Diagnosis present

## 2017-11-09 DIAGNOSIS — F419 Anxiety disorder, unspecified: Secondary | ICD-10-CM | POA: Diagnosis present

## 2017-11-09 DIAGNOSIS — R278 Other lack of coordination: Secondary | ICD-10-CM | POA: Diagnosis not present

## 2017-11-09 DIAGNOSIS — R410 Disorientation, unspecified: Secondary | ICD-10-CM | POA: Diagnosis present

## 2017-11-09 DIAGNOSIS — Z883 Allergy status to other anti-infective agents status: Secondary | ICD-10-CM

## 2017-11-09 DIAGNOSIS — E119 Type 2 diabetes mellitus without complications: Secondary | ICD-10-CM | POA: Diagnosis present

## 2017-11-09 DIAGNOSIS — Z885 Allergy status to narcotic agent status: Secondary | ICD-10-CM

## 2017-11-09 DIAGNOSIS — I951 Orthostatic hypotension: Secondary | ICD-10-CM

## 2017-11-09 DIAGNOSIS — I110 Hypertensive heart disease with heart failure: Secondary | ICD-10-CM | POA: Diagnosis present

## 2017-11-09 DIAGNOSIS — I5023 Acute on chronic systolic (congestive) heart failure: Secondary | ICD-10-CM

## 2017-11-09 DIAGNOSIS — Z7901 Long term (current) use of anticoagulants: Secondary | ICD-10-CM

## 2017-11-09 DIAGNOSIS — Z91048 Other nonmedicinal substance allergy status: Secondary | ICD-10-CM

## 2017-11-09 DIAGNOSIS — I480 Paroxysmal atrial fibrillation: Secondary | ICD-10-CM | POA: Diagnosis present

## 2017-11-09 DIAGNOSIS — I2510 Atherosclerotic heart disease of native coronary artery without angina pectoris: Secondary | ICD-10-CM | POA: Diagnosis present

## 2017-11-09 DIAGNOSIS — Z95810 Presence of automatic (implantable) cardiac defibrillator: Secondary | ICD-10-CM

## 2017-11-09 DIAGNOSIS — I5022 Chronic systolic (congestive) heart failure: Secondary | ICD-10-CM

## 2017-11-09 DIAGNOSIS — J449 Chronic obstructive pulmonary disease, unspecified: Secondary | ICD-10-CM | POA: Diagnosis present

## 2017-11-09 DIAGNOSIS — R188 Other ascites: Secondary | ICD-10-CM | POA: Diagnosis present

## 2017-11-09 DIAGNOSIS — Z86718 Personal history of other venous thrombosis and embolism: Secondary | ICD-10-CM

## 2017-11-09 DIAGNOSIS — R140 Abdominal distension (gaseous): Secondary | ICD-10-CM | POA: Diagnosis present

## 2017-11-09 DIAGNOSIS — F329 Major depressive disorder, single episode, unspecified: Secondary | ICD-10-CM | POA: Diagnosis present

## 2017-11-09 DIAGNOSIS — Z87891 Personal history of nicotine dependence: Secondary | ICD-10-CM | POA: Diagnosis not present

## 2017-11-09 DIAGNOSIS — Z8659 Personal history of other mental and behavioral disorders: Secondary | ICD-10-CM

## 2017-11-09 DIAGNOSIS — Z8719 Personal history of other diseases of the digestive system: Secondary | ICD-10-CM

## 2017-11-09 DIAGNOSIS — R0600 Dyspnea, unspecified: Secondary | ICD-10-CM

## 2017-11-09 LAB — BASIC METABOLIC PANEL
ANION GAP: 10 (ref 5–15)
BUN: 23 mg/dL — ABNORMAL HIGH (ref 6–20)
CALCIUM: 8.8 mg/dL — AB (ref 8.9–10.3)
CO2: 34 mmol/L — AB (ref 22–32)
Chloride: 91 mmol/L — ABNORMAL LOW (ref 101–111)
Creatinine, Ser: 1.47 mg/dL — ABNORMAL HIGH (ref 0.61–1.24)
GFR calc non Af Amer: 50 mL/min — ABNORMAL LOW (ref >60)
GFR, EST AFRICAN AMERICAN: 58 mL/min — AB (ref >60)
Glucose, Bld: 105 mg/dL — ABNORMAL HIGH (ref 65–99)
POTASSIUM: 4.5 mmol/L (ref 3.5–5.1)
Sodium: 135 mmol/L (ref 135–145)

## 2017-11-09 LAB — LACTATE DEHYDROGENASE: LDH: 164 U/L (ref 98–192)

## 2017-11-09 LAB — CBC
HCT: 30.9 % — ABNORMAL LOW (ref 39.0–52.0)
Hemoglobin: 8.6 g/dL — ABNORMAL LOW (ref 13.0–17.0)
MCH: 21.2 pg — ABNORMAL LOW (ref 26.0–34.0)
MCHC: 27.8 g/dL — ABNORMAL LOW (ref 30.0–36.0)
MCV: 76.3 fL — ABNORMAL LOW (ref 78.0–100.0)
Platelets: 186 10*3/uL (ref 150–400)
RBC: 4.05 MIL/uL — AB (ref 4.22–5.81)
RDW: 22.5 % — AB (ref 11.5–15.5)
WBC: 6 10*3/uL (ref 4.0–10.5)

## 2017-11-09 LAB — PROTIME-INR
INR: 3.25
Prothrombin Time: 32.9 seconds — ABNORMAL HIGH (ref 11.4–15.2)

## 2017-11-09 LAB — MRSA PCR SCREENING: MRSA by PCR: NEGATIVE

## 2017-11-09 LAB — GLUCOSE, CAPILLARY
Glucose-Capillary: 105 mg/dL — ABNORMAL HIGH (ref 65–99)
Glucose-Capillary: 181 mg/dL — ABNORMAL HIGH (ref 65–99)

## 2017-11-09 MED ORDER — INSULIN ASPART 100 UNIT/ML ~~LOC~~ SOLN
5.0000 [IU] | Freq: Three times a day (TID) | SUBCUTANEOUS | Status: DC
  Administered 2017-11-10 – 2017-11-21 (×28): 5 [IU] via SUBCUTANEOUS

## 2017-11-09 MED ORDER — FUROSEMIDE 10 MG/ML IJ SOLN
80.0000 mg | Freq: Two times a day (BID) | INTRAMUSCULAR | Status: DC
  Administered 2017-11-09 – 2017-11-10 (×2): 80 mg via INTRAVENOUS
  Filled 2017-11-09 (×2): qty 8

## 2017-11-09 MED ORDER — DOBUTAMINE IN D5W 4-5 MG/ML-% IV SOLN
7.5000 ug/kg/min | INTRAVENOUS | Status: DC
  Filled 2017-11-09: qty 250

## 2017-11-09 MED ORDER — WARFARIN - PHARMACIST DOSING INPATIENT
Freq: Every day | Status: DC
  Administered 2017-11-14 – 2017-11-20 (×5)

## 2017-11-09 MED ORDER — ADULT MULTIVITAMIN W/MINERALS CH
1.0000 | ORAL_TABLET | Freq: Every day | ORAL | Status: DC
  Administered 2017-11-10 – 2017-11-21 (×12): 1 via ORAL
  Filled 2017-11-09 (×12): qty 1

## 2017-11-09 MED ORDER — POTASSIUM CHLORIDE CRYS ER 20 MEQ PO TBCR
20.0000 meq | EXTENDED_RELEASE_TABLET | Freq: Two times a day (BID) | ORAL | Status: DC
  Administered 2017-11-09 – 2017-11-10 (×3): 20 meq via ORAL
  Filled 2017-11-09 (×3): qty 1

## 2017-11-09 MED ORDER — ACETAMINOPHEN 500 MG PO TABS: 1000.0000 mg | ORAL_TABLET | Freq: Four times a day (QID) | ORAL | Status: DC | PRN

## 2017-11-09 MED ORDER — GABAPENTIN 300 MG PO CAPS
300.0000 mg | ORAL_CAPSULE | Freq: Two times a day (BID) | ORAL | Status: DC
  Administered 2017-11-09 – 2017-11-21 (×24): 300 mg via ORAL
  Filled 2017-11-09 (×24): qty 1

## 2017-11-09 MED ORDER — ONDANSETRON 4 MG PO TBDP: 4.0000 mg | ORAL_TABLET | Freq: Three times a day (TID) | ORAL | Status: DC | PRN

## 2017-11-09 MED ORDER — DOBUTAMINE IN D5W 4-5 MG/ML-% IV SOLN
7.5000 ug/kg/min | INTRAVENOUS | Status: DC
  Administered 2017-11-09 – 2017-11-19 (×10): 7.5 ug/kg/min via INTRAVENOUS
  Filled 2017-11-09 (×12): qty 250

## 2017-11-09 MED ORDER — SPIRONOLACTONE 12.5 MG HALF TABLET
12.5000 mg | ORAL_TABLET | Freq: Every day | ORAL | Status: DC
  Administered 2017-11-10 – 2017-11-15 (×6): 12.5 mg via ORAL
  Filled 2017-11-09 (×7): qty 1

## 2017-11-09 MED ORDER — MIDODRINE HCL 5 MG PO TABS
5.0000 mg | ORAL_TABLET | Freq: Three times a day (TID) | ORAL | Status: DC
  Administered 2017-11-09 – 2017-11-13 (×13): 5 mg via ORAL
  Filled 2017-11-09 (×14): qty 1

## 2017-11-09 MED ORDER — DIGOXIN 125 MCG PO TABS
0.1250 mg | ORAL_TABLET | Freq: Every day | ORAL | Status: DC
  Administered 2017-11-10 – 2017-11-21 (×12): 0.125 mg via ORAL
  Filled 2017-11-09 (×12): qty 1

## 2017-11-09 MED ORDER — MAGNESIUM OXIDE 400 (241.3 MG) MG PO TABS
400.0000 mg | ORAL_TABLET | Freq: Every day | ORAL | Status: DC
  Administered 2017-11-09 – 2017-11-21 (×13): 400 mg via ORAL
  Filled 2017-11-09 (×13): qty 1

## 2017-11-09 MED ORDER — ONDANSETRON HCL 4 MG/2ML IJ SOLN
4.0000 mg | Freq: Four times a day (QID) | INTRAMUSCULAR | Status: DC | PRN
  Administered 2017-11-11: 4 mg via INTRAVENOUS
  Filled 2017-11-09: qty 2

## 2017-11-09 MED ORDER — ZOLPIDEM TARTRATE 5 MG PO TABS
10.0000 mg | ORAL_TABLET | Freq: Every evening | ORAL | Status: DC | PRN
  Administered 2017-11-09 – 2017-11-19 (×7): 10 mg via ORAL
  Filled 2017-11-09 (×7): qty 2

## 2017-11-09 MED ORDER — FUROSEMIDE 10 MG/ML IJ SOLN
80.0000 mg | Freq: Once | INTRAMUSCULAR | Status: DC
  Filled 2017-11-09: qty 8

## 2017-11-09 MED ORDER — AMIODARONE HCL 200 MG PO TABS
200.0000 mg | ORAL_TABLET | Freq: Every day | ORAL | Status: DC
  Administered 2017-11-10 – 2017-11-21 (×12): 200 mg via ORAL
  Filled 2017-11-09 (×12): qty 1

## 2017-11-09 MED ORDER — DOCUSATE SODIUM 100 MG PO CAPS
200.0000 mg | ORAL_CAPSULE | Freq: Every day | ORAL | Status: DC
  Administered 2017-11-09 – 2017-11-17 (×8): 200 mg via ORAL
  Filled 2017-11-09 (×8): qty 2

## 2017-11-09 MED ORDER — CITALOPRAM HYDROBROMIDE 20 MG PO TABS
20.0000 mg | ORAL_TABLET | Freq: Every day | ORAL | Status: DC
  Administered 2017-11-09 – 2017-11-21 (×13): 20 mg via ORAL
  Filled 2017-11-09 (×13): qty 1

## 2017-11-09 MED ORDER — TRAMADOL HCL 50 MG PO TABS
50.0000 mg | ORAL_TABLET | Freq: Three times a day (TID) | ORAL | Status: DC | PRN
  Administered 2017-11-09 – 2017-11-20 (×7): 50 mg via ORAL
  Filled 2017-11-09 (×7): qty 1

## 2017-11-09 MED ORDER — SODIUM CHLORIDE 0.9% FLUSH
10.0000 mL | Freq: Two times a day (BID) | INTRAVENOUS | Status: DC
  Administered 2017-11-09 – 2017-11-20 (×16): 10 mL

## 2017-11-09 MED ORDER — SUCRALFATE 1 GM/10ML PO SUSP
1.0000 g | Freq: Three times a day (TID) | ORAL | Status: DC
  Administered 2017-11-09 – 2017-11-21 (×47): 1 g via ORAL
  Filled 2017-11-09 (×49): qty 10

## 2017-11-09 MED ORDER — PANTOPRAZOLE SODIUM 40 MG PO TBEC
40.0000 mg | DELAYED_RELEASE_TABLET | Freq: Two times a day (BID) | ORAL | Status: DC
  Administered 2017-11-09 – 2017-11-21 (×24): 40 mg via ORAL
  Filled 2017-11-09 (×24): qty 1

## 2017-11-09 MED ORDER — INSULIN GLARGINE 100 UNIT/ML ~~LOC~~ SOLN
10.0000 [IU] | Freq: Every day | SUBCUTANEOUS | Status: DC
  Administered 2017-11-09 – 2017-11-20 (×12): 10 [IU] via SUBCUTANEOUS
  Filled 2017-11-09 (×13): qty 0.1

## 2017-11-09 MED ORDER — SODIUM CHLORIDE 0.9% FLUSH: 10.0000 mL | INTRAVENOUS | Status: DC | PRN

## 2017-11-09 MED ORDER — ACETAMINOPHEN 325 MG PO TABS
650.0000 mg | ORAL_TABLET | ORAL | Status: DC | PRN
  Administered 2017-11-11 – 2017-11-19 (×4): 650 mg via ORAL
  Filled 2017-11-09 (×4): qty 2

## 2017-11-09 MED ORDER — SENNOSIDES-DOCUSATE SODIUM 8.6-50 MG PO TABS
2.0000 | ORAL_TABLET | Freq: Every evening | ORAL | Status: DC | PRN
  Administered 2017-11-12 – 2017-11-15 (×3): 2 via ORAL
  Filled 2017-11-09 (×3): qty 2

## 2017-11-09 NOTE — H&P (Signed)
Advanced Heart Failure VAD History and Physical Note   PCP-Cardiologist: No primary care provider on file.   Reason for Admission: A/C Systolic Heart Failure   HPI:   Ronald Milleris a 62 y.o. male with history of chronic systolic HF s/p Medtronic ICD 2014, CAD s/p CABG in 2000, HTN, Hx of cocaine abuse, Tobacco abuse, Depression, PAF, and COPD. HM3 placed 09/09/2016. In April 2018 he was placed on milrinone but later switched to dobutamine. He remains on dobutamine 7.67mg.   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  06/01/2017- Capsule Endoscopy- normal except  tiny angiectasia, nonbleeding in the right colon.  Over the past week he has had increased weight gain and shortness of breath despite increasing torsemide at home.  On Sunday he was instructed to increased torsemide to 100 mg twice a day. He took 200 mg torsemide and 100 mg in pm. Weight at home has been trending up from 228>239 pounds. [facility]   [facility] Increased abdominal bloating and shortness of breath.  Denies BRBPR. No fever or chills.Poor appetite.  No problems with PICC.  LVAD INTERROGATION:  HeartMate II LVAD:  Flow 3.7 liters/min, speed 5900, power 4.7, PI 2.8      Review of Systems: [y] = yes, _0  = no   General: Weight gain [Y ]; Weight loss _1 ; Anorexia _2 ; Fatigue [Y ]; Fever _3 ; Chills _4 ; Weakness [[name]]  Cardiac: Chest pain/pressure _5 ; Resting SOB [[name]]; Exertional SOB [Y ]; Orthopnea [Y ]; Pedal Edema [Y ]; Palpitations _6 ; Syncope _7 ; Presyncope _8 ; Paroxysmal nocturnal dyspnea_9   Pulmonary: Cough _10 ; Wheezing_11 ; Hemoptysis_12 ; Sputum _13 ; Snoring _14   GI: Vomiting_15 ; Dysphagia_16 ; Melena_17 ; Hematochezia _18 ; Heartburn_19 ; Abdominal pain _20 ; Constipation _21 ; Diarrhea _22 ; BRBPR _23   GU: Hematuria_24 ; Dysuria _25 ; Nocturia_26     Vascular: Pain in legs with walking _27 ; Pain in feet with lying flat _28 ; Non-healing sores _29 ; Stroke _30 ; TIA _31 ; Slurred speech _32 ;  Neuro: Headaches_33 ; Vertigo_34 ; Seizures_35 ; Paresthesias_36 ;Blurred vision _37 ; Diplopia _38 ; Vision changes _39   Ortho/Skin: Arthritis [ y]; Joint pain [ Y]; Muscle pain _40 ; Joint swelling _41 ; Back Pain _42 ; Rash _43   Psych: Depression[y ]; Anxiety[y ]  Heme: Bleeding problems _44 ; Clotting disorders _45 ; Anemia [[name]]  Endocrine: Diabetes [ Y]; Thyroid dysfunction_46     Home Medications Prior to Admission medications   Medication Sig Start Date End Date Taking? Authorizing Provider  acetaminophen (TYLENOL) 500 MG tablet Take 1,000 mg by mouth every 6 (six) hours as needed for moderate pain or headache.    [provider]  amiodarone (PACERONE) 200 MG tablet TAKE ONE TABLET BY MOUTH ONCE DAILY 10/31/17   Bensimhon, DShaune Pascal MD  blood glucose meter kit and supplies KIT Dispense based on patient and insurance preference. Use up to four times daily as directed. (FOR ICD-9 250.00, 250.01). Please include strips and lancets. 06/21/17   Bensimhon, DShaune Pascal MD  citalopram (CELEXA) 20 MG tablet Take 1 tablet (20 mg total) by mouth daily. 05/09/17   Clegg, Amy D, NP  digoxin (DIGOX) 0.125 MG tablet Take  1 tablet (0.125 mg total) daily by mouth. 08/18/17   Bensimhon, Shaune Pascal, MD  DOBUTamine (DOBUTREX) 4-5 MG/ML-% infusion Inject 571.8 mcg/min into the vein continuous. PER [facility] pharmacy 05/20/17   Bensimhon, Shaune Pascal, MD  docusate sodium (COLACE) 100 MG capsule Take 200 mg by mouth at bedtime.     [provider]  gabapentin (NEURONTIN) 300 MG capsule Take 1 capsule (300 mg total) by mouth 2 (two) times daily. 09/30/17   Bensimhon, Shaune Pascal, MD  insulin aspart (NOVOLOG FLEXPEN) 100 UNIT/ML FlexPen Inject 5 Units into the skin 3 (three) times daily with meals. 05/09/17   Clegg, Amy D, NP  Insulin Glargine (LANTUS SOLOSTAR) 100 UNIT/ML Solostar Pen  Inject 20 Units into the skin daily at 10 pm. Patient taking differently: Inject 10 Units into the skin daily at 10 pm.  05/09/17   Clegg, Amy D, NP  magnesium oxide (MAG-OX) 400 (241.3 Mg) MG tablet Take 1 tablet (400 mg total) by mouth daily. 05/09/17   Clegg, Amy D, NP  midodrine (PROAMATINE) 5 MG tablet Take 1 tablet (5 mg total) by mouth 3 (three) times daily with meals. 09/21/17   Bensimhon, Shaune Pascal, MD  Multiple Vitamin (MULTIVITAMIN WITH MINERALS) TABS tablet Take 1 tablet by mouth daily.    [provider]  mupirocin ointment (BACTROBAN) 2 % Apply 1 application topically to leg blisters 2 times daily or as needed Patient not taking: Reported on 11/09/2017 07/01/17   Bensimhon, Shaune Pascal, MD  ondansetron (ZOFRAN ODT) 4 MG disintegrating tablet Take 1 tablet (4 mg total) by mouth every 8 (eight) hours as needed for nausea. 04/10/17   Tanna Furry, MD  pantoprazole (PROTONIX) 40 MG tablet Take 1 tablet (40 mg total) by mouth 2 (two) times daily before a meal. 05/09/17   Clegg, Amy D, NP  potassium chloride SA (K-DUR,KLOR-CON) 20 MEQ tablet Take 1 tablet (20 mEq total) by mouth 2 (two) times daily. 06/15/17   [name] Friar, PA-C  senna-docusate (SENOKOT S) 8.6-50 MG tablet Take 2 tablets by mouth at bedtime as needed for mild constipation. 05/09/17   Clegg, Amy D, NP  spironolactone (ALDACTONE) 25 MG tablet Take 0.5 tablets (12.5 mg total) by mouth daily. 06/15/17   [name] Friar, PA-C  sucralfate (CARAFATE) 1 GM/10ML suspension Take 10 mLs (1 g total) by mouth 4 (four) times daily -  with meals and at bedtime. 05/09/17   Darrick Grinder D, NP  torsemide (DEMADEX) 100 MG tablet Take 179m in the morning and 588min the evening 07/20/17   Bensimhon, DaShaune PascalMD  traMADol (ULTRAM) 50 MG tablet Take 1 tablet (50 mg total) by mouth 3 (three) times daily as needed for severe pain. 04/15/17   ClDarrick Grinder, NP  warfarin (COUMADIN) 5 MG tablet Take  1 tablet (5 mg) daily except 1 and 1/2 tablets  (7.5 mg) on Monday and Friday 11/04/17   Bensimhon, DaShaune PascalMD  zolpidem (AMBIEN) 10 MG tablet Take 1 tablet (10 mg total) by mouth at bedtime as needed for sleep. 05/24/17   Bensimhon, DaShaune PascalMD    Past Medical History: Past Medical History:  Diagnosis Date  . AICD (automatic cardioverter/defibrillator) present   . ASCVD (arteriosclerotic cardiovascular disease)   . Chronic systolic CHF (congestive heart failure) ([place]  . COPD (chronic obstructive pulmonary disease) ([place]  . Coronary artery disease   . Depression   . Diabetes mellitus   . GI bleed   .  History of cocaine abuse   . Hypertension   . Presence of permanent cardiac pacemaker   . Shortness of breath dyspnea   . Suicidal ideation   . Tobacco abuse     Past Surgical History: Past Surgical History:  Procedure Laterality Date  . CARDIAC CATHETERIZATION N/A 02/12/2016   Procedure: Right/Left Heart Cath and Coronary/Graft Angiography;  Surgeon: Jolaine Artist, MD;  Location: [place] [facility];  Service: Cardiovascular;  Laterality: N/A;  . CARDIAC CATHETERIZATION N/A 03/22/2016   Procedure: Right Heart Cath;  Surgeon: Jolaine Artist, MD;  Location: [place] [facility];  Service: Cardiovascular;  Laterality: N/A;  . CARDIAC CATHETERIZATION N/A 08/25/2016   Procedure: Right Heart Cath;  Surgeon: Jolaine Artist, MD;  Location: [place] [facility];  Service: Cardiovascular;  Laterality: N/A;  . CARDIAC CATHETERIZATION N/A 09/03/2016   Procedure: Right Heart Cath;  Surgeon: Jolaine Artist, MD;  Location: [place] [facility];  Service: Cardiovascular;  Laterality: N/A;  . CARDIAC DEFIBRILLATOR PLACEMENT    . CARDIAC DEFIBRILLATOR PLACEMENT    . CHOLECYSTECTOMY N/A 08/30/2015   Procedure: LAPAROSCOPIC CHOLECYSTECTOMY;  Surgeon: Hubbard Robinson, MD;  Location: [name];  Service: General;  Laterality: N/A;  . COLONOSCOPY WITH PROPOFOL N/A 08/18/2016   Procedure: COLONOSCOPY WITH PROPOFOL;  Surgeon: Jerene Bears,  MD;  Location: [place];  Service: Gastroenterology;  Laterality: N/A;  . CORONARY ARTERY BYPASS GRAFT  2001  . ENTEROSCOPY N/A 05/02/2017   Procedure: ENTEROSCOPY;  Surgeon: Mauri Pole, MD;  Location[facility] END[facility]COPY;  Service: Endoscopy;  Laterality: N/A;  . GIVENS CAPSULE STUDY N/A 06/01/2017   Procedure: GIVENS CAPSULE STUDY;  Surgeon: Jerene Bears, MD;  Location: [place];  Service: Gastroenterology;  Laterality: N/A;  . INSERT / REPLACE / REMOVE PACEMAKER    . INSERTION OF IMPLANTABLE LEFT VENTRICULAR ASSIST DEVICE N/A 09/09/2016   Procedure: INSERTION OF IMPLANTABLE LEFT VENTRICULAR ASSIST DEVICE;  Surgeon: [name], MD;  Location: [place];  Service: Open Heart Surgery;  Laterality: N/A;  HeartMate 3 CIRC ARREST  NITRIC OXIDE  . IR FLUORO GUIDE CV LINE RIGHT  01/28/2017  . IR GENERIC HISTORICAL  08/03/2016   IR FLUORO GUIDE CV LINE RIGHT 08/03/2016 [name], MD [facility]  . IR GENERIC HISTORICAL  12/14/2016   IR FLUORO GUIDE CV LINE RIGHT 12/14/2016 [name], MD [facility]  . IR US GUIDE VASC ACCESS RIGHT  01/28/2017  . LUMBAR DISC SURGERY  02/1999   Discectomy and fusion  . RIGHT HEART CATH N/A 02/11/2017   Procedure: Right Heart Cath;  Surgeon: Jolaine Artist, MD;  Location: [place] [facility];  Service: Cardiovascular;  Laterality: N/A;  . TEE WITHOUT CARDIOVERSION N/A 09/09/2016   Procedure: TRANSESOPHAGEAL ECHOCARDIOGRAM (TEE);  Surgeon: [name], MD;  Location: [place];  Service: Open Heart Surgery;  Laterality: N/A;  . VASECTOMY     Subsequent reversal    Family History: Family History  Problem Relation Age of Onset  . Arthritis Mother   . Hyperlipidemia Mother   . CAD Father   . Diabetes Father   . Heart failure Father   . Alcohol abuse Father   . Hyperlipidemia Father   . Hyperlipidemia Maternal Grandmother   . Hyperlipidemia Maternal Grandfather   . Hyperlipidemia Paternal Grandmother   . Hyperlipidemia Paternal  Grandfather     Social History: Social History   Socioeconomic History  . Marital status: Divorced    Spouse name:  Not on file  . Number of children: 1  . Years of education: 29  . Highest education level: Not on file  Social Needs  . Financial resource strain: Not on file  . Food insecurity - worry: Not on file  . Food insecurity - inability: Not on file  . Transportation needs - medical: Not on file  . Transportation needs - non-medical: Not on file  Occupational History  . Occupation: Retired  . Occupation: Truck Geophysicist/field seismologist  Tobacco Use  . Smoking status: Former Smoker    Packs/day: 1.50    Years: 23.00    Pack years: 34.50    Types: Cigarettes    Start date: 10/24/1992    Last attempt to quit: 07/01/2016    Years since quitting: 1.3  . Smokeless tobacco: Never Used  Substance and Sexual Activity  . Alcohol use: No    Alcohol/week: 0.0 oz  . Drug use: Yes    Frequency: 0.1 times per week    Types: Cocaine    Comment: 07/01/2016 last use relapse after 8 yrs  . Sexual activity: Not Currently  Other Topics Concern  . Not on file  Social History Narrative   Divorced with one child   No regular exercise    Allergies:  Allergies  Allergen Reactions  . Chlorhexidine Gluconate Itching and Rash  . Codeine Nausea And Vomiting and Other (See Comments)    In high doses  . Trazodone And Nefazodone Other (See Comments)    Dizziness with subsequent fall   . Lipitor [Atorvastatin] Nausea Only and Other (See Comments)    Nausea with high doses, tolerates 52m dose (08/22/16)  . Tape Other (See Comments)    Paper tape is ok    Objective:    Vital Signs:   BP: ()/()  Arterial Line BP: ()/()    There were no vitals filed for this visit.  Mean arterial Pressure 110   Physical Exam    General:  Appears chronically ill. No resp difficulty HEENT: Normal Neck: supple. JVP to jaw  . Carotids 2+ bilat; no bruits. No lymphadenopathy or thyromegaly appreciated. Cor:  Mechanical heart sounds with LVAD hum present. R upper chest tunneled PICC.  Lungs: Clear Abdomen: soft, nontender, markedly distended. No hepatosplenomegaly. No bruits or masses. Good bowel sounds. Driveline: C/D/I; securement device intact and driveline incorporated Extremities: no cyanosis, clubbing, rash, 2+ edema (wrapped)  Neuro: alert & orientedx3, cranial nerves grossly intact. moves all 4 extremities w/o difficulty. Affect pleasant   Telemetry   Sinus tach 120s (Personally reviewed)  EKG   pending   Labs    Basic Metabolic Panel: No results for input(s): NA, K, CL, CO2, GLUCOSE, BUN, CREATININE, CALCIUM, MG, PHOS in the last 168 hours.  Liver Function Tests: No results for input(s): AST, ALT, ALKPHOS, BILITOT, PROT, ALBUMIN in the last 168 hours. No results for input(s): LIPASE, AMYLASE in the last 168 hours. No results for input(s): AMMONIA in the last 168 hours.  CBC: No results for input(s): WBC, NEUTROABS, HGB, HCT, MCV, PLT in the last 168 hours.  Cardiac Enzymes: No results for input(s): CKTOTAL, CKMB, CKMBINDEX, TROPONINI in the last 168 hours.  BNP: BNP (last 3 results) Recent Labs    11/30/16 1158 01/21/17 1230  BNP 565.5* 649.9*    ProBNP (last 3 results) No results for input(s): PROBNP in the last 8760 hours.   CBG: No results for input(s): GLUCAP in the last 168 hours.  Coagulation Studies: No results for input(s):  LABPROT, INR in the last 72 hours.  Other results: EKG:   Imaging     No results found.    Patient Profile:   Ronald Milleris a 62 y.o.male with history of chronic systolic HF s/p Medtronic ICD 2014, CAD s/p CABG in 2000, HTN, Hx of cocaine abuse, Tobacco abuse, Depression, PAF, and COPD. HM3 placed 09/09/2016. In April 2018 he was placed on milrinone but later switched to dobutamine. He remains on dobutamine 7.60mg.   Assessment/Plan:    1. 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 Has medtronic icd.  RV Failure- on home dobutamine 7.5 mcg. Plan to continue.  NYHA IIIB with HMIII + dobutamine. Marked volume overload. Admit for diuresis with IV lasix.   No BB with RV failure. No Ace with hypotension.  Plan to continue midodrine 10 mg three times a day.  Follow BMET daily.  Continue coumadin. INR per phamacy.   2. CAD Severe 3V Disease No s/s ischemia.   3. DMII Home regimen + sliding scale.  Check Hgb A1C.   4. PAF Interrogate device.  On coumadin.   5. Anemia, iron deficiency Has capsule endoscopy 04/2017 with severe gastritis. Continue carafate.   6.  Anxiety/Depression  Continue celexa.   7. H/O RUE DVT   8. Chronic Anticoagulation On coumadin. Follow INR daily.   9. Abdominal Distention Diurese with IV lasix. May need paracentesis.    I reviewed the LVAD parameters from today, and compared the results to the patient's prior recorded data.  No programming changes were made.  The LVAD is functioning within specified parameters.  The patient performs LVAD self-test daily.  LVAD interrogation was negative for any significant power changes, alarms or PI events/speed drops.  LVAD equipment check completed and is in good working order.  Back-up equipment present.   LVAD education done on emergency procedures and precautions and reviewed exit site care.  Length of Stay: 0  ADarrick Grinder NP 11/09/2017, 2:57 PM  VAD Team Pager [phone](7am - 7am) +++VAD ISSUES ONLY+++   Advanced Heart Failure Team Pager [phone](M-F; 7[place]  Please contact [facility] for night-coverage after hours (4p -7a ) and weekends on [email or web address] for all non- LVAD Issues  Patient seen and examined with the above-signed Advanced Practice Provider and/or Housestaff. I personally reviewed laboratory data, imaging studies and relevant notes. I independently examined the patient and formulated the important aspects of the plan. I have edited the note to reflect any of my  changes or salient points. I have personally discussed the plan with the patient and/or family.  62y/o male with end-stage biventricular HF now s/p HM-III VAD c/b ongoing RV failure and cardiac cirrhosis. Presented to clinic today with worsening HF symptoms and marked volume overload/ascites not responding to increases in outpatient oral diuretics.   On exam ill appearing weak JVP to ear Cor + HMIII Lungs decreased in bases Ab markedly distended NT Driveline site ok  Ext 2+ edema wrapped   Will continue dobutamine for RV support. Start IV lasix and metolazone. Will get ab u/s may need paracentesis. INR 3.25. VAD interrogated personally. Parameters stable.  DGlori Bickers MD  7:05 PM

## 2017-11-09 NOTE — Progress Notes (Signed)
ANTICOAGULATION CONSULT NOTE - Initial Consult  Pharmacy Consult for warfarin Indication: LVAD  Allergies  Allergen Reactions  . Chlorhexidine Gluconate Itching and Rash  . Codeine Nausea And Vomiting and Other (See Comments)    In high doses  . Trazodone And Nefazodone Other (See Comments)    Dizziness with subsequent fall   . Lipitor [Atorvastatin] Nausea Only and Other (See Comments)    Nausea with high doses, tolerates 20mg  dose (08/22/16)  . Tape Other (See Comments)    Paper tape is ok    Patient Measurements: Height: 5\' 9"  (175.3 cm) Weight: 242 lb 1 oz (109.8 kg) IBW/kg (Calculated) : 70.7  Vital Signs: Temp: 98 F (36.7 C) (02/06 1611) Temp Source: Oral (02/06 1611) BP: 95/55 (02/06 1611) Pulse Rate: 63 (02/06 1611)  Labs: Recent Labs    11/09/17 1422  HGB 8.6*  HCT 30.9*  PLT 186  LABPROT 32.9*  INR 3.25  CREATININE 1.47*    Estimated Creatinine Clearance: 64.4 mL/min (A) (by C-G formula based on SCr of 1.47 mg/dL (H)).   Medical History: Past Medical History:  Diagnosis Date  . AICD (automatic cardioverter/defibrillator) present   . ASCVD (arteriosclerotic cardiovascular disease)   . Chronic systolic CHF (congestive heart failure) ([place])   . COPD (chronic obstructive pulmonary disease) ([place])   . Coronary artery disease   . Depression   . Diabetes mellitus   . GI bleed   . History of cocaine abuse   . Hypertension   . Presence of permanent cardiac pacemaker   . Shortness of breath dyspnea   . Suicidal ideation   . Tobacco abuse    Assessment: 62 year old male with HM3 placed 12/17. Patient with volume overload being admitted for diuresis.   Patient has issues with bleeding in the past on warfarin so INR goal is lower for him. Last INR in clinic was at goal.  INR this afternoon is 3.25, will hold warfarin tonight. No bleeding issues noted.  PTA warfarin dose is 5mg  daily except 7.5mg  on Monday and Friday.   Goal of Therapy:  INR goal  1.8-2.4 Monitor platelets by anticoagulation protocol: Yes   Plan:  Hold warfarin tonight Daily INR  Erin Hearing PharmD., BCPS Clinical Pharmacist 11/09/2017 4:45 PM

## 2017-11-09 NOTE — Progress Notes (Signed)
Patient presents for sick visit in [facility] today. Reports feeling increased shortness of breath with severe abdominal distention. Reports no problems with VAD equipment or concerns with drive line.   Vital Signs:  Doppler Pressure 110   Automatc BP: 126/45 ()86) HR:126   SPO2:unable  %  Weight: 239.4 lb w/o eqt Last weight: 228.6 lb Home weights: 222-227 lbs   VAD Indication: Destination Therapy- eval completed at [facility]    VAD interrogation & Equipment Management: Speed:5900 Flow: 5.7 Power:4.7 w    PI:2.8  Alarms: no clinical alarms Events: none  Fixed speed 5900 Low speed limit: 5600  Primary Controller:  Replace back up battery in 45months. Back up controller:   Replace back up battery in 18 months.  Annual Equipment Maintenance on UBC/PM was performed on 09/2017.    Exit Site Care: Drive line is being maintained weekly  by VAD Coordinators. Drive line exit site well healed and incorporated. The velour is fully implanted at exit site. Dressing dry and intact. No erythema or drainage. Stabilization device present and accurately applied. Pt denies fever or chills. Changed today in clinic.  Significant Events on VAD Support:  11/30/16> Milrinone gtt at discharge 01/27/17>dobutamine at 5 mcg/kg/mingtt at discharge 03/08/17> RV failure 05/19/17 >dobutamine decreased from 7.5 to 6  06/2017> admit with syncope, AMS, Dobutamine at 7.8mcg  Device:Protect Therapies: off; end of service 08/2016 Last check: complete interrogation 05/26/2017. DDD (lower rate 60); BiV pacing with 96% AS-VP  Plan: Patient is being admitted for diuresis.  Balinda Quails RN [place] Coordinator   Office: [phone] 24/7 Emergency VAD Pager: [phone]

## 2017-11-10 ENCOUNTER — Encounter (HOSPITAL_COMMUNITY): Payer: Self-pay

## 2017-11-10 ENCOUNTER — Inpatient Hospital Stay (HOSPITAL_COMMUNITY): Payer: Medicare HMO

## 2017-11-10 ENCOUNTER — Other Ambulatory Visit (HOSPITAL_COMMUNITY): Payer: Self-pay | Admitting: Internal Medicine

## 2017-11-10 HISTORY — PX: IR PARACENTESIS: IMG2679

## 2017-11-10 LAB — GRAM STAIN: Gram Stain: NONE SEEN

## 2017-11-10 LAB — CBC
HCT: 29.5 % — ABNORMAL LOW (ref 39.0–52.0)
HEMOGLOBIN: 8.3 g/dL — AB (ref 13.0–17.0)
MCH: 21.2 pg — AB (ref 26.0–34.0)
MCHC: 28.1 g/dL — AB (ref 30.0–36.0)
MCV: 75.3 fL — AB (ref 78.0–100.0)
Platelets: 187 10*3/uL (ref 150–400)
RBC: 3.92 MIL/uL — ABNORMAL LOW (ref 4.22–5.81)
RDW: 22.3 % — ABNORMAL HIGH (ref 11.5–15.5)
WBC: 5.9 10*3/uL (ref 4.0–10.5)

## 2017-11-10 LAB — LACTATE DEHYDROGENASE: LDH: 139 U/L (ref 98–192)

## 2017-11-10 LAB — BASIC METABOLIC PANEL
Anion gap: 12 (ref 5–15)
BUN: 22 mg/dL — AB (ref 6–20)
CHLORIDE: 90 mmol/L — AB (ref 101–111)
CO2: 32 mmol/L (ref 22–32)
CREATININE: 1.43 mg/dL — AB (ref 0.61–1.24)
Calcium: 8.4 mg/dL — ABNORMAL LOW (ref 8.9–10.3)
GFR calc Af Amer: 60 mL/min — ABNORMAL LOW (ref >60)
GFR calc non Af Amer: 51 mL/min — ABNORMAL LOW (ref >60)
GLUCOSE: 130 mg/dL — AB (ref 65–99)
Potassium: 4.3 mmol/L (ref 3.5–5.1)
Sodium: 134 mmol/L — ABNORMAL LOW (ref 135–145)

## 2017-11-10 LAB — PROTIME-INR
INR: 3.14
PROTHROMBIN TIME: 32 s — AB (ref 11.4–15.2)

## 2017-11-10 LAB — BODY FLUID CELL COUNT WITH DIFFERENTIAL
Lymphs, Fluid: 45 %
MONOCYTE-MACROPHAGE-SEROUS FLUID: 52 % (ref 50–90)
Neutrophil Count, Fluid: 3 % (ref 0–25)
Total Nucleated Cell Count, Fluid: 168 cu mm (ref 0–1000)

## 2017-11-10 LAB — GLUCOSE, CAPILLARY
Glucose-Capillary: 145 mg/dL — ABNORMAL HIGH (ref 65–99)
Glucose-Capillary: 110 mg/dL — ABNORMAL HIGH (ref 65–99)
Glucose-Capillary: 141 mg/dL — ABNORMAL HIGH (ref 65–99)

## 2017-11-10 LAB — LACTATE DEHYDROGENASE, PLEURAL OR PERITONEAL FLUID: LD FL: 64 U/L — AB (ref 3–23)

## 2017-11-10 MED ORDER — FUROSEMIDE 10 MG/ML IJ SOLN
80.0000 mg | Freq: Three times a day (TID) | INTRAMUSCULAR | Status: DC
  Administered 2017-11-10 – 2017-11-15 (×16): 80 mg via INTRAVENOUS
  Filled 2017-11-10 (×16): qty 8

## 2017-11-10 MED ORDER — METOLAZONE 2.5 MG PO TABS
2.5000 mg | ORAL_TABLET | Freq: Once | ORAL | Status: AC
  Administered 2017-11-10: 2.5 mg via ORAL
  Filled 2017-11-10: qty 1

## 2017-11-10 MED ORDER — ALTEPLASE 2 MG IJ SOLR
2.0000 mg | Freq: Once | INTRAMUSCULAR | Status: AC
  Administered 2017-11-10: 2 mg
  Filled 2017-11-10 (×2): qty 2

## 2017-11-10 MED ORDER — LIDOCAINE 2% (20 MG/ML) 5 ML SYRINGE
INTRAMUSCULAR | Status: AC
  Filled 2017-11-10: qty 10

## 2017-11-10 MED ORDER — INSULIN ASPART 100 UNIT/ML ~~LOC~~ SOLN
0.0000 [IU] | Freq: Three times a day (TID) | SUBCUTANEOUS | Status: DC
  Administered 2017-11-10 – 2017-11-11 (×2): 2 [IU] via SUBCUTANEOUS
  Administered 2017-11-12 – 2017-11-13 (×2): 3 [IU] via SUBCUTANEOUS
  Administered 2017-11-13 – 2017-11-19 (×10): 2 [IU] via SUBCUTANEOUS
  Administered 2017-11-20: 3 [IU] via SUBCUTANEOUS
  Administered 2017-11-21: 11 [IU] via SUBCUTANEOUS
  Administered 2017-11-21: 3 [IU] via SUBCUTANEOUS

## 2017-11-10 MED ORDER — ALTEPLASE 2 MG IJ SOLR
2.0000 mg | Freq: Once | INTRAMUSCULAR | Status: AC
  Administered 2017-11-10: 2 mg

## 2017-11-10 MED ORDER — LIDOCAINE HCL (PF) 1 % IJ SOLN
INTRAMUSCULAR | Status: AC | PRN
  Administered 2017-11-10: 9 mL

## 2017-11-10 NOTE — Progress Notes (Signed)
Advanced Heart Failure VAD Team Note  PCP-Cardiologist: No primary care provider on file.   Subjective:    Admitted with marked volume overload.   Diuresing IV lasix. Continues on dobutamine 7.5 mcg.   SOB with exertion. Ab very bloated.  + edema   LVAD INTERROGATION:  HeartMate II LVAD:  Flow 5.5  liters/min, speed 5900, power 5, PI 2.8    Objective:    Vital Signs:   Temp:  [97.7 F (36.5 C)-98.4 F (36.9 C)] 98.4 F (36.9 C) (02/07 0805) Pulse Rate:  [63] 63 (02/06 1611) Resp:  [15-24] 18 (02/07 0730) BP: (86-135)/(49-111) 86/65 (02/06 2322) SpO2:  [91 %-95 %] 93 % (02/07 0400) Weight:  [239 lb 12.8 oz (108.8 kg)-242 lb 1 oz (109.8 kg)] 239 lb 12.8 oz (108.8 kg) (02/07 0636) Last BM Date: 11/10/17 Mean arterial Pressure 80-90s  Intake/Output:   Intake/Output Summary (Last 24 hours) at 11/10/2017 0939 Last data filed at 11/10/2017 0806 Gross per 24 hour  Intake 862.81 ml  Output 1520 ml  Net -657.19 ml     Physical Exam    Physical Exam: GENERAL: Appears fatigued. NAD. Sitting on the side of the bed.  HEENT: normal  NECK: Supple, JVP to jaw.  2+ bilaterally, no bruits.  No lymphadenopathy or thyromegaly appreciated.   CARDIAC:  Mechanical heart sounds with LVAD hum present. R upper chest tunneled catheter.  LUNGS:  LLL crackles. On room air. Clear to auscultation bilaterally.  ABDOMEN:  Soft, round, nontender, positive bowel sounds x4.    +++Distended LVAD exit site: well-healed and incorporated.  Dressing dry and intact.  No erythema or drainage.  Stabilization device present and accurately applied.  Driveline dressing is being changed daily per sterile technique. EXTREMITIES:  Warm and dry, no cyanosis, clubbing, rash. R and LLE 2+ edema  NEUROLOGIC:  Alert and oriented x 4.  Gait steady.  No aphasia.  No dysarthria.  Affect pleasant.       Telemetry   A paced 60s  Personally reviewed  EKG   N/A  Labs   Basic Metabolic Panel: Recent Labs  Lab  11/09/17 1422 11/10/17 0415  NA 135 134*  K 4.5 4.3  CL 91* 90*  CO2 34* 32  GLUCOSE 105* 130*  BUN 23* 22*  CREATININE 1.47* 1.43*  CALCIUM 8.8* 8.4*    Liver Function Tests: No results for input(s): AST, ALT, ALKPHOS, BILITOT, PROT, ALBUMIN in the last 168 hours. No results for input(s): LIPASE, AMYLASE in the last 168 hours. No results for input(s): AMMONIA in the last 168 hours.  CBC: Recent Labs  Lab 11/09/17 1422 11/10/17 0415  WBC 6.0 5.9  HGB 8.6* 8.3*  HCT 30.9* 29.5*  MCV 76.3* 75.3*  PLT 186 187    INR: Recent Labs  Lab 11/03/17 1344 11/09/17 1422 11/10/17 0415  INR 2.07 3.25 3.14    Other results:     Imaging   Dg Chest Port 1 View  Result Date: 11/09/2017 CLINICAL DATA:  Shortness of breath and epigastric pain beginning this morning, abdominal distension, history CHF, hypertension, diabetes mellitus, COPD, former smoker, coronary artery disease EXAM: PORTABLE CHEST 1 VIEW COMPARISON:  Portable exam 1840 hours compared to 10/26/2017 FINDINGS: LEFT ventricular pacemaker/AICD with leads projecting over RIGHT atrium, RIGHT ventricle and coronary sinus. LEFT ventricular assist device present. RIGHT jugular central venous catheter with tip projecting over cavoatrial junction. Enlargement of cardiac silhouette. Atherosclerotic calcification aorta. Bibasilar atelectasis. No gross acute infiltrate, pleural effusion or pneumothorax. Bones  demineralized. IMPRESSION: Enlargement of cardiac silhouette post median sternotomy, pacemaker/AICD and LVAD. Bibasilar atelectasis. Electronically Signed   By: Lavonia Dana M.D.   On: 11/09/2017 19:14      Medications:     Scheduled Medications: . alteplase  2 mg Intracatheter Once  . amiodarone  200 mg Oral Daily  . citalopram  20 mg Oral Daily  . digoxin  0.125 mg Oral Daily  . docusate sodium  200 mg Oral QHS  . furosemide  80 mg Intravenous BID  . gabapentin  300 mg Oral BID  . insulin aspart  0-15 Units  Subcutaneous TID WC  . insulin aspart  5 Units Subcutaneous TID WC  . insulin glargine  10 Units Subcutaneous Q2200  . magnesium oxide  400 mg Oral Daily  . midodrine  5 mg Oral TID WC  . multivitamin with minerals  1 tablet Oral Daily  . pantoprazole  40 mg Oral BID AC  . potassium chloride SA  20 mEq Oral BID  . sodium chloride flush  10-40 mL Intracatheter Q12H  . spironolactone  12.5 mg Oral Daily  . sucralfate  1 g Oral TID WC & HS  . Warfarin - Pharmacist Dosing Inpatient   Does not apply q1800     Infusions: . DOBUTamine 7.5 mcg/kg/min (11/09/17 2029)     PRN Medications:  acetaminophen, ondansetron (ZOFRAN) IV, ondansetron, senna-docusate, sodium chloride flush, traMADol, zolpidem   Patient Profile   Ronald Milleris a 62 y.o.male with history of chronic systolic HF s/p Medtronic ICD 2014, CAD s/p CABG in 2000, HTN, Hx of cocaine abuse, Tobacco abuse, Depression, PAF, and COPD. HM3 placed 09/09/2016. In April 2018 he was placed on milrinone but later switched to dobutamine. He remains on dobutamine 7.55mcg.    Assessment/Plan:   1. 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 Has medtronic icd.  RV Failure- on home dobutamine 7.5 mcg. Plan to continue.  NYHA IIIB with HMIII + dobutamine.  Sluggish urine output. Increase lasix to 80 mg tid and give 2.5 mg metolazone.  Renal function stable. Marland Kitchen   No BB with RV failure. No Ace with hypotension.  Plan to continue midodrine 10 mg three times a day.  Continue coumadin. INR per phamacy.  Set up CVP.   2. CAD Severe 3V Disease No s/s ischemia.   3. DMII Home regimen + sliding scale.  Check Hgb A1C.   4. PAF Interrogate device.  On coumadin.   5. Anemia, iron deficiency Has capsule endoscopy 04/2017 with severe gastritis. Continue carafate.  Hgb drifting down 8.6>8.3. No obvious source.  6.  Anxiety/Depression  Continue celexa.   7. H/O RUE DVT   8. Chronic  Anticoagulation On coumadin. INR 3.1. Discussed with pharmacy. Holding coumadin.    9. Abdominal Distention Diurese with IV lasix.  Abd [place] today. May need paracentesis.     I reviewed the LVAD parameters from today, and compared the results to the patient's prior recorded data.  No programming changes were made.  The LVAD is functioning within specified parameters.  The patient performs LVAD self-test daily.  LVAD interrogation was negative for any significant power changes, alarms or PI events/speed drops.  LVAD equipment check completed and is in good working order.  Back-up equipment present.   LVAD education done on emergency procedures and precautions and reviewed exit site care.  Length of Stay: 1  Darrick Grinder, NP 11/10/2017, 9:39 AM  VAD Team --- VAD ISSUES  ONLY--- Pager [phone] (7am - 7am)  Advanced Heart Failure Team  Pager [phone] (M-F; 7a - 4p)  Please contact [facility] for night-coverage after hours (4p -7a ) and weekends on [email or web address]   Patient seen and examined with Darrick Grinder, NP. We discussed all aspects of the encounter. I agree with the assessment and plan as stated above.   He remains markedly volume overloaded in setting of severe RV failure and cardiac cirrhosis. Diuresing with IV lasix but has marked ascites on ab u/s (Personally reviewed) . Will plan paracentesis today to help with symptom relief and decompress renal vein. Continue dobutamine for RV support. VAD interrogated personally. Parameters stable. INR 3.1. Discussed dosing with PharmD personally. Long discussion with him that he is not a transplant candidate.   Total time spent 35 minutes. Over half that time spent discussing above.   Glori Bickers, MD  5:31 PM

## 2017-11-10 NOTE — Progress Notes (Signed)
[facility]  Mr. Wah is an active pt with [facility] for [facility] services for home Dobutamine and heart failure management.    [facility] team will follow pt while here to support the HF team and pt at [facility].  If patient discharges after hours, please call [phone].   [name] 11/10/2017, 12:11 PM

## 2017-11-10 NOTE — Progress Notes (Signed)
ANTICOAGULATION CONSULT NOTE - Follow Up Consult  Pharmacy Consult for warfarin Indication: LVAD  Allergies  Allergen Reactions  . Chlorhexidine Gluconate Itching and Rash  . Codeine Nausea And Vomiting and Other (See Comments)    In high doses  . Trazodone And Nefazodone Other (See Comments)    Dizziness with subsequent fall   . Lipitor [Atorvastatin] Nausea Only and Other (See Comments)    Nausea with high doses, tolerates 20mg  dose (08/22/16)  . Tape Other (See Comments)    Paper tape is ok    Patient Measurements: Height: 5\' 9"  (175.3 cm) Weight: 239 lb 12.8 oz (108.8 kg) IBW/kg (Calculated) : 70.7  Vital Signs: Temp: 98.4 F (36.9 C) (02/07 0805) Temp Source: Oral (02/07 0805)  Labs: Recent Labs    11/09/17 1422 11/10/17 0415  HGB 8.6* 8.3*  HCT 30.9* 29.5*  PLT 186 187  LABPROT 32.9* 32.0*  INR 3.25 3.14  CREATININE 1.47* 1.43*    Estimated Creatinine Clearance: 65.9 mL/min (A) (by C-G formula based on SCr of 1.43 mg/dL (H)).  Assessment: 62 year old male with HM3 placed 12/17. Patient with volume overload being admitted for diuresis. Patient has issues with bleeding in the past on warfarin so INR goal is lower for him. Last INR in clinic was at goal on 1/31.  INR on admit was 3.25 and dose held. INR remains elevated today at 3.14. Hgb low but stable. No bleeding reported. May need paracentesis pending abdominal ultrasound.  PTA warfarin dose is 5mg  daily except 7.5mg  on Monday and Friday.   Goal of Therapy:  INR goal 1.8-2.4 Monitor platelets by anticoagulation protocol: Yes   Plan:  Hold warfarin tonight Daily INR Follow up abdominal ultrasound and plan for possible paracentesis  Nena Jordan, PharmD, BCPS 11/10/2017 12:06 PM

## 2017-11-10 NOTE — Progress Notes (Signed)
VAD Coordinator Procedure Note:   Patient underwent paracentesis. Hemodynamics and VAD parameters monitored by me throughout the procedure. MAPs were obtained with automatic BP cuff on the left arm.          Auto cuff(MAP):  Flow: PI: Power:     Speed:      Time:          Pre-procedure:112/96(103)    5.3 3.0  4.7        5900  1627                         113/85 (91)    5.4 2.7  4.6        5900  1630              100/87 (92)     5.4 2.4  4.6        5900  1645      Interventions: removed 5 1/2 L of fluid from pts abdomen  Recovery area: 100/68 (79)   5.5  2.7   4.6         5900  1700     Patient tolerated the procedure well. PIs were 2.7-3.0 throughout the case with no power elevations. No sedation was required for this procedure, pt was placed 2L/[place] with a  pulse ox 97% and maintained >92% throughout the remainder of the procedure. MAPs were 79-103.   Patient Disposition: Pt was accompanied back to 2H24 and handoff was given to Huey, Therapist, sports.   Tanda Rockers RN, BSN VAD Coordinator 24/7 Pager [phone]

## 2017-11-10 NOTE — Progress Notes (Signed)
LVAD Coordinator Rounding Note:  Admitted 9/7/18due fluid volume overload.   HM III LVAD implanted on 09/09/16 by Dr. Nancy Nordmann Destination Therapycriteria.   Vital signs: Temp: 98.4 HR: 65 Doppler Pressure: 90 Automatic BP: 86/65 (73) O2 Sat: 93% on RA Wt in lbs: 239lbs   LVAD interrogation reveals:   Speed: 5900 Flow: 5.5 Power: 4.6w PI: 2.3 Alarms: none in last 24 hours Events: 10 PI events overnight Hematocrit: 29 Fixed speed: 5900 Low speed limit: 5600  Drive Line: Weekly. Changed yesterday in clinic. Next change due 11/16/17.  Labs:  LDH trend: 139  INR trend: 3.14  Anticoagulation Plan: -INR Goal: 2.0 - 2.5 -ASA Dose: no ASA since hospitalization 04/29/17 for GI bleed; hx of gastritis  Gtts: Dobutamine 7.5 mcg/kg/min   Device: Medtronic BiV - DDD (lower rage 60)  -Therapies: off, end of service 08/2016   Adverse Events on VAD: Significant Events on VAD Support:  - 2/27 - 12/06/16>hospitalized for BiV HF; discharged home on Mil 0.125 - 4/20 - 01/28/17>hospitalized for HF; tunneled PICC placed R subclavian; transitioned to Dob 5 (poor Co-ox on mil) - 6/1 - 03/08/17>hospitalized for R>L CHF; Dobutamine 5 - 7/9 - 04/16/17> hospitalized for altered mental status and HF. Psych eval; Dobutamine increased to 6 - 7/27 - 05/09/17>Hospitalization with melena and recurrent HF. EGD shoed ulcers in stomach. Dobutamine increasedfrom 6 to 7.5 - 8/28 - 06/03/17>hospitalization with melena and planned IP capsule- negative - 11/09/17> hospitalized for HF; IV diuretics    Plan/Recommendations:   1. Please page VAD coordinator for equipment issues or questions.   Tanda Rockers RN, VAD Coordinator 24/7 pager [phone]

## 2017-11-11 ENCOUNTER — Encounter (HOSPITAL_COMMUNITY): Payer: Self-pay | Admitting: General Surgery

## 2017-11-11 ENCOUNTER — Other Ambulatory Visit: Payer: Self-pay

## 2017-11-11 DIAGNOSIS — K761 Chronic passive congestion of liver: Secondary | ICD-10-CM

## 2017-11-11 LAB — GLUCOSE, CAPILLARY
GLUCOSE-CAPILLARY: 149 mg/dL — AB (ref 65–99)
GLUCOSE-CAPILLARY: 123 mg/dL — AB (ref 65–99)
Glucose-Capillary: 141 mg/dL — ABNORMAL HIGH (ref 65–99)
Glucose-Capillary: 110 mg/dL — ABNORMAL HIGH (ref 65–99)
Glucose-Capillary: 191 mg/dL — ABNORMAL HIGH (ref 65–99)

## 2017-11-11 LAB — CBC
HEMATOCRIT: 28.6 % — AB (ref 39.0–52.0)
HEMOGLOBIN: 8 g/dL — AB (ref 13.0–17.0)
MCH: 21.1 pg — AB (ref 26.0–34.0)
MCHC: 28 g/dL — ABNORMAL LOW (ref 30.0–36.0)
MCV: 75.5 fL — ABNORMAL LOW (ref 78.0–100.0)
Platelets: 181 10*3/uL (ref 150–400)
RBC: 3.79 MIL/uL — ABNORMAL LOW (ref 4.22–5.81)
RDW: 21.9 % — ABNORMAL HIGH (ref 11.5–15.5)
WBC: 6.2 10*3/uL (ref 4.0–10.5)

## 2017-11-11 LAB — BASIC METABOLIC PANEL
Anion gap: 13 (ref 5–15)
BUN: 23 mg/dL — AB (ref 6–20)
CHLORIDE: 86 mmol/L — AB (ref 101–111)
CO2: 33 mmol/L — ABNORMAL HIGH (ref 22–32)
Calcium: 8.2 mg/dL — ABNORMAL LOW (ref 8.9–10.3)
Creatinine, Ser: 1.46 mg/dL — ABNORMAL HIGH (ref 0.61–1.24)
GFR calc Af Amer: 58 mL/min — ABNORMAL LOW (ref >60)
GFR calc non Af Amer: 50 mL/min — ABNORMAL LOW (ref >60)
Glucose, Bld: 201 mg/dL — ABNORMAL HIGH (ref 65–99)
Potassium: 3.6 mmol/L (ref 3.5–5.1)
SODIUM: 132 mmol/L — AB (ref 135–145)

## 2017-11-11 LAB — FOLATE: Folate: 14.7 ng/mL (ref >5.9)

## 2017-11-11 LAB — PROTIME-INR
INR: 2.5
PROTHROMBIN TIME: 26.8 s — AB (ref 11.4–15.2)

## 2017-11-11 LAB — RETICULOCYTES
RBC.: 3.79 MIL/uL — AB (ref 4.22–5.81)
RETIC CT PCT: 1.5 % (ref 0.4–3.1)
Retic Count, Absolute: 56.9 10*3/uL (ref 19.0–186.0)

## 2017-11-11 LAB — MAGNESIUM: MAGNESIUM: 2.2 mg/dL (ref 1.7–2.4)

## 2017-11-11 LAB — IRON AND TIBC
Iron: 22 ug/dL — ABNORMAL LOW (ref 45–182)
Saturation Ratios: 7 % — ABNORMAL LOW (ref 17.9–39.5)
TIBC: 325 ug/dL (ref 250–450)
UIBC: 303 ug/dL

## 2017-11-11 LAB — FERRITIN: Ferritin: 27 ng/mL (ref 24–336)

## 2017-11-11 LAB — VITAMIN B12: VITAMIN B 12: 425 pg/mL (ref 180–914)

## 2017-11-11 LAB — LACTATE DEHYDROGENASE: LDH: 150 U/L (ref 98–192)

## 2017-11-11 LAB — HEMOGLOBIN A1C
Hgb A1c MFr Bld: 6.1 % — ABNORMAL HIGH (ref 4.8–5.6)
MEAN PLASMA GLUCOSE: 128.37 mg/dL

## 2017-11-11 MED ORDER — WARFARIN SODIUM 7.5 MG PO TABS
7.5000 mg | ORAL_TABLET | Freq: Once | ORAL | Status: AC
  Administered 2017-11-11: 7.5 mg via ORAL
  Filled 2017-11-11: qty 1

## 2017-11-11 MED ORDER — SODIUM CHLORIDE 0.9 % IV SOLN
510.0000 mg | Freq: Once | INTRAVENOUS | Status: AC
  Administered 2017-11-11: 510 mg via INTRAVENOUS
  Filled 2017-11-11: qty 17

## 2017-11-11 MED ORDER — METOLAZONE 2.5 MG PO TABS
2.5000 mg | ORAL_TABLET | Freq: Once | ORAL | Status: AC
  Administered 2017-11-11: 2.5 mg via ORAL
  Filled 2017-11-11: qty 1

## 2017-11-11 MED ORDER — POTASSIUM CHLORIDE CRYS ER 20 MEQ PO TBCR
40.0000 meq | EXTENDED_RELEASE_TABLET | Freq: Two times a day (BID) | ORAL | Status: DC
  Administered 2017-11-11 – 2017-11-20 (×20): 40 meq via ORAL
  Filled 2017-11-11 (×20): qty 2

## 2017-11-11 NOTE — Progress Notes (Signed)
Recheck bp, 76/59 (65), manual 75.  Paged heart failure team to give vs.  Awaiting call back.

## 2017-11-11 NOTE — Progress Notes (Signed)
Cuff bp 40/26 (32), manual 76.  Cardiology fellow paged.

## 2017-11-11 NOTE — Progress Notes (Signed)
LVAD Coordinator Rounding Note:  Admitted 9/7/18due fluid volume overload.   HM III LVAD implanted on 09/09/16 by Dr. Nancy Nordmann Destination Therapycriteria.  Pt states that he feels much better this morning and that his belly is not as tight. 5 1/2 L were removed during pts paracentesis yesterday.   Vital signs: Temp: 97.9 HR: 68 Doppler Pressure: 92 Automatic BP: 100/66 O2 Sat: 92% on RA Wt in lbs: 239>226lbs   LVAD interrogation reveals:   Speed: 5900 Flow: 5.3 Power: 4.6w PI: 3.1 Alarms: none in last 24 hours Events: 10 PI events overnight Hematocrit: 29 Fixed speed: 5900 Low speed limit: 5600  Drive Line: Weekly. Next change due 11/16/17.  Labs:  LDH trend: 139>150  INR trend: 3.14>2.5  Anticoagulation Plan: -INR Goal: 2.0 - 2.5 -ASA Dose: no ASA since hospitalization 04/29/17 for GI bleed; hx of gastritis  Gtts: Dobutamine 7.5 mcg/kg/min   Device: Medtronic BiV - DDD (lower rage 60)  -Therapies: off, end of service 08/2016   Adverse Events on VAD: Significant Events on VAD Support:  - 2/27 - 12/06/16>hospitalized for BiV HF; discharged home on Mil 0.125 - 4/20 - 01/28/17>hospitalized for HF; tunneled PICC placed R subclavian; transitioned to Dob 5 (poor Co-ox on mil) - 6/1 - 03/08/17>hospitalized for R>L CHF; Dobutamine 5 - 7/9 - 04/16/17> hospitalized for altered mental status and HF. Psych eval; Dobutamine increased to 6 - 7/27 - 05/09/17>Hospitalization with melena and recurrent HF. EGD shoed ulcers in stomach. Dobutamine increasedfrom 6 to 7.5 - 8/28 - 06/03/17>hospitalization with melena and planned IP capsule- negative - 11/09/17> hospitalized for HF; IV diuretics    Plan/Recommendations:   1. Please page VAD coordinator for equipment issues or questions.   Tanda Rockers RN, VAD Coordinator 24/7 pager [phone]

## 2017-11-11 NOTE — Plan of Care (Signed)
  Progressing Cardiac: LVAD will function as expected and patient will experience no clinical alarms 11/11/2017 0629 - Progressing by Burman Blacksmith, RN Education: Patient will understand all VAD equipment and how it functions 11/11/2017 0629 - Progressing by Burman Blacksmith, RN Patient will be able to verbalize current INR target range and antiplatelet therapy for discharge home 11/11/2017 0629 - Progressing by Burman Blacksmith, RN Note Coumadin on hold for high INR. Per pharmacy

## 2017-11-11 NOTE — Progress Notes (Signed)
ANTICOAGULATION CONSULT NOTE - Follow Up Consult  Pharmacy Consult for warfarin Indication: LVAD  Allergies  Allergen Reactions  . Chlorhexidine Gluconate Itching and Rash  . Codeine Nausea And Vomiting and Other (See Comments)    In high doses  . Trazodone And Nefazodone Other (See Comments)    Dizziness with subsequent fall   . Lipitor [Atorvastatin] Nausea Only and Other (See Comments)    Nausea with high doses, tolerates 20mg  dose (08/22/16)  . Tape Other (See Comments)    Paper tape is ok    Patient Measurements: Height: 5\' 9"  (175.3 cm) Weight: 226 lb 6.6 oz (102.7 kg) IBW/kg (Calculated) : 70.7  Vital Signs: Temp: 97.9 F (36.6 C) (02/08 0300) Temp Source: Oral (02/08 0300) BP: 100/66 (02/08 0815)  Labs: Recent Labs    11/09/17 1422 11/10/17 0415 11/11/17 0300  HGB 8.6* 8.3* 8.0*  HCT 30.9* 29.5* 28.6*  PLT 186 187 181  LABPROT 32.9* 32.0* 26.8*  INR 3.25 3.14 2.50  CREATININE 1.47* 1.43* 1.46*    Estimated Creatinine Clearance: 62.8 mL/min (A) (by C-G formula based on SCr of 1.46 mg/dL (H)).  Assessment: 62 year old male with HM3 placed 12/17. Patient with volume overload being admitted for diuresis. Patient has issues with bleeding in the past on warfarin so INR goal is lower for him. Last INR in clinic was at goal on 1/31.  Doses held for 2 days d/t supratherapeutic INRs, INR down to 2.5 today which is just slightly above goal. S/P paracentesis yesterday. Will restart warfarin tonight. Hgb with slow trend down to 8. No bleeding reported.  PTA warfarin dose is 5mg  daily except 7.5mg  on Monday and Friday.   Goal of Therapy:  INR goal 1.8-2.4 Monitor platelets by anticoagulation protocol: Yes   Plan:  Warfarin 7.5mg  tonight Daily INR   Nena Jordan, PharmD, BCPS 11/11/2017 1:13 PM

## 2017-11-11 NOTE — Procedures (Signed)
Ultrasound-guided diagnostic and therapeutic paracentesis performed yielding 5.5 liters of serous colored fluid. No immediate complications.  Ronald Miller

## 2017-11-11 NOTE — Progress Notes (Signed)
Advanced Heart Failure VAD Team Note  PCP-Cardiologist: No primary care provider on file.   Subjective:    Admitted with marked volume overload.   2/7 S/P Paracentesis 5.5 liters removed. Weight trending down 242>226 pounds.   Diuresing with IV lasix  80 mg three times a day + metolazone.   Feeling better. Denies SOB.    LVAD INTERROGATION:  HeartMate II LVAD:  Flow 5  liters/min, speed 5900, power 4.6 PI 3    Objective:    Vital Signs:   Temp:  [97.7 F (36.5 C)-98.4 F (36.9 C)] 97.9 F (36.6 C) (02/08 0300) Pulse Rate:  [60] 60 (02/07 2000) Resp:  [13-43] 43 (02/08 0700) BP: (94-112)/(66-96) 100/66 (02/08 0500) SpO2:  [96 %-100 %] 98 % (02/08 0700) Weight:  [226 lb 6.6 oz (102.7 kg)] 226 lb 6.6 oz (102.7 kg) (02/08 0500) Last BM Date: 11/10/17 Mean arterial Pressure 80-90s  Intake/Output:   Intake/Output Summary (Last 24 hours) at 11/11/2017 0725 Last data filed at 11/11/2017 0700 Gross per 24 hour  Intake 1697.6 ml  Output 3225 ml  Net -1527.4 ml     Physical Exam     Physical Exam: CVP 20  GENERAL:No  acute distress.In bed  HEENT: normal  NECK: Supple, JVP to jaw  .  2+ bilaterally, no bruits.  No lymphadenopathy or thyromegaly appreciated.   CARDIAC:  Mechanical heart sounds with LVAD hum present. R upper chest tunneled catheter.  LUNGS:  Clear to auscultation bilaterally.  ABDOMEN:  Soft, round, nontender, positive bowel sounds x4. + distended.  LVAD exit site: well-healed and incorporated.  Dressing dry and intact.  No erythema or drainage.  Stabilization device present and accurately applied.  Driveline dressing is being changed daily per sterile technique. EXTREMITIES:  Warm and dry, no cyanosis, clubbing, rash or edema  NEUROLOGIC:  Alert and oriented x 4.  Gait steady.  No aphasia.  No dysarthria.  Affect pleasant.       Telemetry   A paced 60s personally reviewed.   EKG   N/A  Labs   Basic Metabolic Panel: Recent Labs  Lab  11/09/17 1422 11/10/17 0415 11/11/17 0300  NA 135 134* 132*  K 4.5 4.3 3.6  CL 91* 90* 86*  CO2 34* 32 33*  GLUCOSE 105* 130* 201*  BUN 23* 22* 23*  CREATININE 1.47* 1.43* 1.46*  CALCIUM 8.8* 8.4* 8.2*  MG  --   --  2.2    Liver Function Tests: No results for input(s): AST, ALT, ALKPHOS, BILITOT, PROT, ALBUMIN in the last 168 hours. No results for input(s): LIPASE, AMYLASE in the last 168 hours. No results for input(s): AMMONIA in the last 168 hours.  CBC: Recent Labs  Lab 11/09/17 1422 11/10/17 0415 11/11/17 0300  WBC 6.0 5.9 6.2  HGB 8.6* 8.3* 8.0*  HCT 30.9* 29.5* 28.6*  MCV 76.3* 75.3* 75.5*  PLT 186 187 181    INR: Recent Labs  Lab 11/09/17 1422 11/10/17 0415 11/11/17 0300  INR 3.25 3.14 2.50    Other results:     Imaging   US Abdomen Limited  Result Date: 11/10/2017 CLINICAL DATA:  Evaluate for ascites. EXAM: LIMITED ABDOMEN ULTRASOUND FOR ASCITES TECHNIQUE: Limited ultrasound survey for ascites was performed in all four abdominal quadrants. COMPARISON:  Plain films 06/13/2017 FINDINGS: Moderate amount of ascites is identified within all 4 quadrants. The largest pockets are seen in the left lower and left upper quadrants. IMPRESSION: Moderate amount of ascites. Electronically Signed   By:  Abigail Miyamoto M.D.   On: 11/10/2017 12:03   Dg Chest Port 1 View  Result Date: 11/09/2017 CLINICAL DATA:  Shortness of breath and epigastric pain beginning this morning, abdominal distension, history CHF, hypertension, diabetes mellitus, COPD, former smoker, coronary artery disease EXAM: PORTABLE CHEST 1 VIEW COMPARISON:  Portable exam 1840 hours compared to 10/26/2017 FINDINGS: LEFT ventricular pacemaker/AICD with leads projecting over RIGHT atrium, RIGHT ventricle and coronary sinus. LEFT ventricular assist device present. RIGHT jugular central venous catheter with tip projecting over cavoatrial junction. Enlargement of cardiac silhouette. Atherosclerotic calcification  aorta. Bibasilar atelectasis. No gross acute infiltrate, pleural effusion or pneumothorax. Bones demineralized. IMPRESSION: Enlargement of cardiac silhouette post median sternotomy, pacemaker/AICD and LVAD. Bibasilar atelectasis. Electronically Signed   By: Lavonia Dana M.D.   On: 11/09/2017 19:14     Medications:     Scheduled Medications: . amiodarone  200 mg Oral Daily  . citalopram  20 mg Oral Daily  . digoxin  0.125 mg Oral Daily  . docusate sodium  200 mg Oral QHS  . furosemide  80 mg Intravenous TID  . gabapentin  300 mg Oral BID  . insulin aspart  0-15 Units Subcutaneous TID WC  . insulin aspart  5 Units Subcutaneous TID WC  . insulin glargine  10 Units Subcutaneous Q2200  . magnesium oxide  400 mg Oral Daily  . midodrine  5 mg Oral TID WC  . multivitamin with minerals  1 tablet Oral Daily  . pantoprazole  40 mg Oral BID AC  . potassium chloride SA  20 mEq Oral BID  . sodium chloride flush  10-40 mL Intracatheter Q12H  . spironolactone  12.5 mg Oral Daily  . sucralfate  1 g Oral TID WC & HS  . Warfarin - Pharmacist Dosing Inpatient   Does not apply q1800    Infusions: . DOBUTamine 7.5 mcg/kg/min (11/10/17 1336)    PRN Medications: acetaminophen, lidocaine (PF), ondansetron (ZOFRAN) IV, ondansetron, senna-docusate, sodium chloride flush, traMADol, zolpidem   Patient Profile   Ronald Milleris a 62 y.o.male with history of chronic systolic HF s/p Medtronic ICD 2014, CAD s/p CABG in 2000, HTN, Hx of cocaine abuse, Tobacco abuse, Depression, PAF, and COPD. HM3 placed 09/09/2016. In April 2018 he was placed on milrinone but later switched to dobutamine. He remains on dobutamine 7.42mcg.    Assessment/Plan:   1. 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 Has medtronic icd.  RV Failure- on home dobutamine 7.5 mcg. Plan to continue.  NYHA IIIB with HMIII + dobutamine.  Improved urine output. CVP 20. Continue 80 mg IV lasix tid + 2.5 mg  metolazone.  Renal function stable.    No BB with RV failure. No Ace with hypotension.  Plan to continue midodrine 10 mg three times a day.  Continue coumadin. INR 2.5 per phamacy.   2. CAD Severe 3V Disease No s/s ischemia.   3. DMII Home regimen + sliding scale.   Hgb A1C 6.1 .   4. PAF Interrogate device.  On coumadin.   5. Anemia, iron deficiency Has capsule endoscopy 04/2017 with severe gastritis. Continue carafate.  Hgb 8.6>8.3>8.   6.  Anxiety/Depression  Continue celexa.   7. H/O RUE DVT   8. Chronic Anticoagulation On coumadin. INR 2.5 Discussed with pharmacy.   9. Abdominal Distention S/P Abd Paracentesis -- 5.5 liters removed.    Transfer to [facility]. I reviewed the LVAD parameters from today, and compared the results to  the patient's prior recorded data.  No programming changes were made.  The LVAD is functioning within specified parameters.  The patient performs LVAD self-test daily.  LVAD interrogation was negative for any significant power changes, alarms or PI events/speed drops.  LVAD equipment check completed and is in good working order.  Back-up equipment present.   LVAD education done on emergency procedures and precautions and reviewed exit site care.  Length of Stay: 2  Darrick Grinder, NP 11/11/2017, 7:25 AM  VAD Team --- VAD ISSUES ONLY--- Pager [phone] (7am - 7am)  Advanced Heart Failure Team  Pager [phone] (M-F; 7a - 4p)  Please contact [facility] for night-coverage after hours (4p -7a ) and weekends on [email or web address]  Patient seen and examined with Darrick Grinder, NP. We discussed all aspects of the encounter. I agree with the assessment and plan as stated above.   He has severe RV failure with cardiac cirrhosis despite dobutamine for RV support. He is s/p paracentesis yesterday with 5L off but volume status markedly elevate with CVP close to 20. Will continue IV diuresis throughout the weekend. Continue home dobutamine. INR 2.5. Discussed dosing  with PharmD personally. VAD interrogated personally. Parameters stable. Long talk again about him not being transplant candidate at this point.   Glori Bickers, MD  5:47 PM

## 2017-11-12 LAB — PROTIME-INR
INR: 2.14
PROTHROMBIN TIME: 23.7 s — AB (ref 11.4–15.2)

## 2017-11-12 LAB — CBC
HCT: 29.6 % — ABNORMAL LOW (ref 39.0–52.0)
HEMOGLOBIN: 8.4 g/dL — AB (ref 13.0–17.0)
MCH: 21.4 pg — AB (ref 26.0–34.0)
MCHC: 28.4 g/dL — ABNORMAL LOW (ref 30.0–36.0)
MCV: 75.5 fL — AB (ref 78.0–100.0)
PLATELETS: 166 10*3/uL (ref 150–400)
RBC: 3.92 MIL/uL — AB (ref 4.22–5.81)
RDW: 21.6 % — ABNORMAL HIGH (ref 11.5–15.5)
WBC: 5.6 10*3/uL (ref 4.0–10.5)

## 2017-11-12 LAB — BASIC METABOLIC PANEL
ANION GAP: 13 (ref 5–15)
BUN: 23 mg/dL — AB (ref 6–20)
CHLORIDE: 81 mmol/L — AB (ref 101–111)
CO2: 36 mmol/L — ABNORMAL HIGH (ref 22–32)
Calcium: 8.3 mg/dL — ABNORMAL LOW (ref 8.9–10.3)
Creatinine, Ser: 1.38 mg/dL — ABNORMAL HIGH (ref 0.61–1.24)
GFR calc Af Amer: >60 mL/min (ref >60)
GFR, EST NON AFRICAN AMERICAN: 54 mL/min — AB (ref >60)
Glucose, Bld: 149 mg/dL — ABNORMAL HIGH (ref 65–99)
POTASSIUM: 3.4 mmol/L — AB (ref 3.5–5.1)
SODIUM: 130 mmol/L — AB (ref 135–145)

## 2017-11-12 LAB — GLUCOSE, CAPILLARY
GLUCOSE-CAPILLARY: 165 mg/dL — AB (ref 65–99)
GLUCOSE-CAPILLARY: 86 mg/dL (ref 65–99)
GLUCOSE-CAPILLARY: 92 mg/dL (ref 65–99)
Glucose-Capillary: 212 mg/dL — ABNORMAL HIGH (ref 65–99)

## 2017-11-12 LAB — LACTATE DEHYDROGENASE: LDH: 136 U/L (ref 98–192)

## 2017-11-12 MED ORDER — METOLAZONE 2.5 MG PO TABS
2.5000 mg | ORAL_TABLET | Freq: Once | ORAL | Status: AC
  Administered 2017-11-12: 2.5 mg via ORAL
  Filled 2017-11-12: qty 1

## 2017-11-12 MED ORDER — WARFARIN SODIUM 7.5 MG PO TABS
7.5000 mg | ORAL_TABLET | Freq: Once | ORAL | Status: AC
  Administered 2017-11-12: 7.5 mg via ORAL
  Filled 2017-11-12: qty 1

## 2017-11-12 MED ORDER — POTASSIUM CHLORIDE CRYS ER 20 MEQ PO TBCR
40.0000 meq | EXTENDED_RELEASE_TABLET | Freq: Once | ORAL | Status: AC
  Administered 2017-11-12: 40 meq via ORAL
  Filled 2017-11-12: qty 2

## 2017-11-12 NOTE — Progress Notes (Signed)
Advanced Heart Failure VAD Team Note  PCP-Cardiologist: No primary care provider on file.   Subjective:    Admitted with marked volume overload.   2/7 S/P Paracentesis 5.5 liters removed. Weight trending down 242>226 pounds from admission.  Good UOP yesterday with Lasix + metolazone (net negative 3 L).   Still some dyspnea, abdomen still feels full.    LVAD INTERROGATION:  HeartMate II LVAD:  Flow 5.5  liters/min, speed 5900, power 4.6, PI 2.3.  11 PI events/24 hrs   Objective:    Vital Signs:   Temp:  [97.3 F (36.3 C)-97.8 F (36.6 C)] 97.8 F (36.6 C) (02/09 0722) Pulse Rate:  [60-73] 73 (02/09 0855) Resp:  [15-25] 19 (02/09 0855) BP: (49-95)/(30-84) 95/84 (02/09 0855) SpO2:  [82 %-99 %] 97 % (02/09 0855) Weight:  [225 lb 6.4 oz (102.2 kg)-226 lb 13.7 oz (102.9 kg)] 226 lb 13.7 oz (102.9 kg) (02/09 0855) Last BM Date: 11/10/17 Mean arterial Pressure 90  Intake/Output:   Intake/Output Summary (Last 24 hours) at 11/12/2017 1108 Last data filed at 11/12/2017 1100 Gross per 24 hour  Intake 690.8 ml  Output 2950 ml  Net -2259.2 ml     Physical Exam     Physical Exam: CVP > 20  GENERAL: Well appearing this am. NAD.  HEENT: Normal. NECK: Supple, JVP 16+. Carotids OK.  CARDIAC:  Mechanical heart sounds with LVAD hum present.  LUNGS:  CTAB, normal effort.  ABDOMEN:  NT,+BS.  Moderately distended.  LVAD exit site: Well-healed and incorporated. Dressing dry and intact. No erythema or drainage. Stabilization device present and accurately applied. Driveline dressing changed daily per sterile technique. EXTREMITIES:  Warm and dry. No cyanosis, clubbing, rash. 1+ edema at ankles.  NEUROLOGIC:  Alert & oriented x 3. Cranial nerves grossly intact. Moves all 4 extremities w/o difficulty. Affect pleasant     Telemetry   V-paced, artifact makes atrial rhythm difficult to discern.  Personally reviewed.   EKG   N/A  Labs   Basic Metabolic Panel: Recent Labs  Lab  11/09/17 1422 11/10/17 0415 11/11/17 0300 11/12/17 0459  NA 135 134* 132* 130*  K 4.5 4.3 3.6 3.4*  CL 91* 90* 86* 81*  CO2 34* 32 33* 36*  GLUCOSE 105* 130* 201* 149*  BUN 23* 22* 23* 23*  CREATININE 1.47* 1.43* 1.46* 1.38*  CALCIUM 8.8* 8.4* 8.2* 8.3*  MG  --   --  2.2  --     Liver Function Tests: No results for input(s): AST, ALT, ALKPHOS, BILITOT, PROT, ALBUMIN in the last 168 hours. No results for input(s): LIPASE, AMYLASE in the last 168 hours. No results for input(s): AMMONIA in the last 168 hours.  CBC: Recent Labs  Lab 11/09/17 1422 11/10/17 0415 11/11/17 0300 11/12/17 0459  WBC 6.0 5.9 6.2 5.6  HGB 8.6* 8.3* 8.0* 8.4*  HCT 30.9* 29.5* 28.6* 29.6*  MCV 76.3* 75.3* 75.5* 75.5*  PLT 186 187 181 166    INR: Recent Labs  Lab 11/09/17 1422 11/10/17 0415 11/11/17 0300 11/12/17 0459  INR 3.25 3.14 2.50 2.14    Other results:     Imaging   US Abdomen Limited  Result Date: 11/10/2017 CLINICAL DATA:  Evaluate for ascites. EXAM: LIMITED ABDOMEN ULTRASOUND FOR ASCITES TECHNIQUE: Limited ultrasound survey for ascites was performed in all four abdominal quadrants. COMPARISON:  Plain films 06/13/2017 FINDINGS: Moderate amount of ascites is identified within all 4 quadrants. The largest pockets are seen in the left lower and left upper  quadrants. IMPRESSION: Moderate amount of ascites. Electronically Signed   By: Abigail Miyamoto M.D.   On: 11/10/2017 12:03   Ir Paracentesis  Result Date: 11/11/2017 INDICATION: Patient with a history of heart failure and an LVAD in place. New findings of ascites. Request is made for diagnostic and therapeutic paracentesis. The LVAD nurse was with him the entirety of the procedure. EXAM: ULTRASOUND GUIDED DIAGNOSTIC AND THERAPEUTIC PARACENTESIS MEDICATIONS: 2% lidocaine COMPLICATIONS: None immediate. PROCEDURE: Informed written consent was obtained from the patient after a discussion of the risks, benefits and alternatives to treatment. A  timeout was performed prior to the initiation of the procedure. Initial ultrasound scanning demonstrates a large amount of ascites within the right lower abdominal quadrant. The right lower abdomen was prepped and draped in the usual sterile fashion. 2% lidocaine was used for local anesthesia. Following this, a 19 gauge, 7-cm, Yueh catheter was introduced. An ultrasound image was saved for documentation purposes. The paracentesis was performed. The catheter was removed and a dressing was applied. The patient tolerated the procedure well without immediate post procedural complication. FINDINGS: A total of approximately 5.5 L of serous fluid was removed. Samples were sent to the laboratory as requested by the clinical team. IMPRESSION: Successful ultrasound-guided paracentesis yielding 5.5 liters of peritoneal fluid. Read by: Saverio Danker, PA-C Electronically Signed   By: Markus Daft M.D.   On: 11/11/2017 08:30     Medications:     Scheduled Medications: . amiodarone  200 mg Oral Daily  . citalopram  20 mg Oral Daily  . digoxin  0.125 mg Oral Daily  . docusate sodium  200 mg Oral QHS  . furosemide  80 mg Intravenous TID  . gabapentin  300 mg Oral BID  . insulin aspart  0-15 Units Subcutaneous TID WC  . insulin aspart  5 Units Subcutaneous TID WC  . insulin glargine  10 Units Subcutaneous Q2200  . magnesium oxide  400 mg Oral Daily  . metolazone  2.5 mg Oral Once  . midodrine  5 mg Oral TID WC  . multivitamin with minerals  1 tablet Oral Daily  . pantoprazole  40 mg Oral BID AC  . potassium chloride SA  40 mEq Oral BID  . potassium chloride  40 mEq Oral Once  . sodium chloride flush  10-40 mL Intracatheter Q12H  . spironolactone  12.5 mg Oral Daily  . sucralfate  1 g Oral TID WC & HS  . Warfarin - Pharmacist Dosing Inpatient   Does not apply q1800    Infusions: . DOBUTamine 7.5 mcg/kg/min (11/12/17 1100)    PRN Medications: acetaminophen, lidocaine (PF), ondansetron (ZOFRAN) IV,  ondansetron, senna-docusate, sodium chloride flush, traMADol, zolpidem   Patient Profile   Ronald Milleris a 62 y.o.male with history of chronic systolic HF s/p Medtronic ICD 2014, CAD s/p CABG in 2000, HTN, Hx of cocaine abuse, Tobacco abuse, Depression, PAF, and COPD. HM3 placed 09/09/2016. In April 2018 he was placed on milrinone but later switched to dobutamine. He remains on dobutamine 7.73mcg.    Assessment/Plan:   1. 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 Has Medtronic ICD. He was admitted with intractable RV failure despite home dobutamine. He remains markedly volume overloaded with CVP > 20.  He is diuresing well on current regimen and had paracentesis. BP hard to attain but think stable (got MAP 90 this morning).  LDH stable at 136, LVAD parameters stable.  - Continue dobutamine 7.5. - Lasix 80 mg  IV tid + metolazone 2.5 x 1 again today.  - Continue midodrine 5 mg tid.  - Not a good heart transplant candidate.  2. CAD: Stable, no chest pain.  3. DMII: Home regimen + sliding scale.  4. PAF: ?underlying atrial rhythm, will get ECG.  5. Anemia, iron deficiency: Had capsule endoscopy 04/2017 with severe gastritis. Continue carafate. Hgb 8.6>8.3>8>8.4.  6.  Anxiety/Depression  - Continue celexa.  7. H/O RUE DVT: warfarin 8. Chronic Anticoagulation: On coumadin, INR goal 2-2.5. INR 2.1 today. .  9. Cardiac cirrhosis with ascites: Already had paracentesis this admission.  Abdomen remains distended.  Would re-evaluate with [place] on Monday, ?repeat paracentesis.    I reviewed the LVAD parameters from today, and compared the results to the patient's prior recorded data.  No programming changes were made.  The LVAD is functioning within specified parameters.  The patient performs LVAD self-test daily.  LVAD interrogation was negative for any significant power changes, alarms or PI events/speed drops.  LVAD equipment check completed and is in good working order.   Back-up equipment present.   LVAD education done on emergency procedures and precautions and reviewed exit site care.  Length of Stay: 3  Loralie Champagne, MD 11/12/2017, 11:08 AM  VAD Team --- VAD ISSUES ONLY--- Pager [phone] (7am - 7am)  Advanced Heart Failure Team  Pager [phone] (M-F; 7a - 4p)  Please contact [facility] for night-coverage after hours (4p -7a ) and weekends on [email or web address]

## 2017-11-12 NOTE — Progress Notes (Signed)
ANTICOAGULATION CONSULT NOTE - Follow Up Consult  Pharmacy Consult for warfarin Indication: LVAD  Allergies  Allergen Reactions  . Chlorhexidine Gluconate Itching and Rash  . Codeine Nausea And Vomiting and Other (See Comments)    In high doses  . Trazodone And Nefazodone Other (See Comments)    Dizziness with subsequent fall   . Lipitor [Atorvastatin] Nausea Only and Other (See Comments)    Nausea with high doses, tolerates 20mg  dose (08/22/16)  . Tape Other (See Comments)    Paper tape is ok    Patient Measurements: Height: 5\' 9"  (175.3 cm) Weight: 226 lb 13.7 oz (102.9 kg) IBW/kg (Calculated) : 70.7  Vital Signs: Temp: 97.9 F (36.6 C) (02/09 1121) Temp Source: Oral (02/09 1121) BP: 83/73 (02/09 1200) Pulse Rate: 50 (02/09 1200)  Labs: Recent Labs    11/10/17 0415 11/11/17 0300 11/12/17 0459  HGB 8.3* 8.0* 8.4*  HCT 29.5* 28.6* 29.6*  PLT 187 181 166  LABPROT 32.0* 26.8* 23.7*  INR 3.14 2.50 2.14  CREATININE 1.43* 1.46* 1.38*    Estimated Creatinine Clearance: 66.5 mL/min (A) (by C-G formula based on SCr of 1.38 mg/dL (H)).  Assessment: 62 year old male with HM3 placed 12/17. Patient with volume overload being admitted for diuresis. Patient has issues with bleeding in the past on warfarin so INR goal is lower for him. Last INR in clinic was at goal on 1/31.  Doses held for 2 days d/t supratherapeutic INRs, INR down to 2.5 today which is just slightly above goal. S/P paracentesis 2/7. Warfarin restarted last pm.  INR 2.1 fell but still a goal.   Hgb with slow trend down to 8. No bleeding reported.  PTA warfarin dose is 5mg  daily except 7.5mg  on Monday and Friday.   Goal of Therapy:  INR goal 1.8-2.4 Monitor platelets by anticoagulation protocol: Yes   Plan:  Warfarin 7.5mg  x1 tonight repeat Daily INR   Bonnita Nasuti Pharm.D. CPP, BCPS Clinical Pharmacist [phone] 11/12/2017 3:56 PM

## 2017-11-13 LAB — BASIC METABOLIC PANEL
ANION GAP: 14 (ref 5–15)
Anion gap: 10 (ref 5–15)
BUN: 25 mg/dL — ABNORMAL HIGH (ref 6–20)
BUN: 27 mg/dL — ABNORMAL HIGH (ref 6–20)
CHLORIDE: 78 mmol/L — AB (ref 101–111)
CHLORIDE: 83 mmol/L — AB (ref 101–111)
CO2: 37 mmol/L — AB (ref 22–32)
CO2: 39 mmol/L — ABNORMAL HIGH (ref 22–32)
Calcium: 8.3 mg/dL — ABNORMAL LOW (ref 8.9–10.3)
Calcium: 8.5 mg/dL — ABNORMAL LOW (ref 8.9–10.3)
Creatinine, Ser: 1.34 mg/dL — ABNORMAL HIGH (ref 0.61–1.24)
Creatinine, Ser: 1.58 mg/dL — ABNORMAL HIGH (ref 0.61–1.24)
GFR calc non Af Amer: 56 mL/min — ABNORMAL LOW (ref >60)
GFR calc non Af Amer: 46 mL/min — ABNORMAL LOW (ref >60)
GFR, EST AFRICAN AMERICAN: 53 mL/min — AB (ref >60)
Glucose, Bld: 105 mg/dL — ABNORMAL HIGH (ref 65–99)
Glucose, Bld: 100 mg/dL — ABNORMAL HIGH (ref 65–99)
POTASSIUM: 3.3 mmol/L — AB (ref 3.5–5.1)
POTASSIUM: 4.7 mmol/L (ref 3.5–5.1)
SODIUM: 132 mmol/L — AB (ref 135–145)
Sodium: 129 mmol/L — ABNORMAL LOW (ref 135–145)

## 2017-11-13 LAB — CBC
HEMATOCRIT: 28.8 % — AB (ref 39.0–52.0)
HEMOGLOBIN: 8.1 g/dL — AB (ref 13.0–17.0)
MCH: 21.3 pg — ABNORMAL LOW (ref 26.0–34.0)
MCHC: 28.1 g/dL — AB (ref 30.0–36.0)
MCV: 75.8 fL — ABNORMAL LOW (ref 78.0–100.0)
Platelets: 203 10*3/uL (ref 150–400)
RBC: 3.8 MIL/uL — AB (ref 4.22–5.81)
RDW: 21.7 % — ABNORMAL HIGH (ref 11.5–15.5)
WBC: 6.1 10*3/uL (ref 4.0–10.5)

## 2017-11-13 LAB — GLUCOSE, CAPILLARY
GLUCOSE-CAPILLARY: 86 mg/dL (ref 65–99)
GLUCOSE-CAPILLARY: 173 mg/dL — AB (ref 65–99)
Glucose-Capillary: 124 mg/dL — ABNORMAL HIGH (ref 65–99)
Glucose-Capillary: 153 mg/dL — ABNORMAL HIGH (ref 65–99)
Glucose-Capillary: 64 mg/dL — ABNORMAL LOW (ref 65–99)

## 2017-11-13 LAB — PROTIME-INR
INR: 2.04
Prothrombin Time: 22.8 seconds — ABNORMAL HIGH (ref 11.4–15.2)

## 2017-11-13 LAB — LACTATE DEHYDROGENASE: LDH: 178 U/L (ref 98–192)

## 2017-11-13 MED ORDER — POTASSIUM CHLORIDE CRYS ER 20 MEQ PO TBCR
40.0000 meq | EXTENDED_RELEASE_TABLET | Freq: Once | ORAL | Status: AC
  Administered 2017-11-13: 40 meq via ORAL
  Filled 2017-11-13: qty 2

## 2017-11-13 MED ORDER — SODIUM CHLORIDE 0.9 % IV SOLN
510.0000 mg | Freq: Once | INTRAVENOUS | Status: AC
  Administered 2017-11-14: 510 mg via INTRAVENOUS
  Filled 2017-11-13 (×2): qty 17

## 2017-11-13 MED ORDER — WARFARIN SODIUM 5 MG PO TABS
5.0000 mg | ORAL_TABLET | ORAL | Status: DC
  Administered 2017-11-13: 5 mg via ORAL
  Filled 2017-11-13: qty 1

## 2017-11-13 MED ORDER — WARFARIN SODIUM 7.5 MG PO TABS
7.5000 mg | ORAL_TABLET | ORAL | Status: DC
  Administered 2017-11-14: 7.5 mg via ORAL
  Filled 2017-11-13: qty 1

## 2017-11-13 MED ORDER — METOLAZONE 2.5 MG PO TABS
2.5000 mg | ORAL_TABLET | Freq: Two times a day (BID) | ORAL | Status: DC
  Administered 2017-11-13 – 2017-11-15 (×5): 2.5 mg via ORAL
  Filled 2017-11-13 (×5): qty 1

## 2017-11-13 NOTE — Progress Notes (Signed)
Pt had sinus arrhythmia with rate in 120's, b/p 76/66, doppler map 74, cvp 20 and blood sugar 64. Pt requiring 2liters nasal cannula, Pt very sleepy and weak, pt is requiring 2 person assist to stand to urinate. Paged Dr Aundra Dubin and heart failure MD on call number. Waiting for call back.

## 2017-11-13 NOTE — Progress Notes (Signed)
Patient ID: Ronald Miller., male   DOB: 1956/02/17, 62 y.o.   MRN: [id number]   Advanced Heart Failure VAD Team Note  PCP-Cardiologist: No primary care provider on file.   Subjective:    Admitted with marked volume overload.   2/7 S/P Paracentesis 5.5 liters removed.  Weight down 3 lbs with diuresis again over the last day.   Still some dyspnea, abdomen still feels full.  CVP > 20.  He is weak/dizzy with standing.  MAP by doppler is around 90.   LVAD INTERROGATION:  HeartMate II LVAD:  Flow 5.4  liters/min, speed 5900, power 4.5, PI 2.1.  15 PI events/24 hrs   Objective:    Vital Signs:   Temp:  [97.6 F (36.4 C)-98.1 F (36.7 C)] 98.1 F (36.7 C) (02/10 0722) Pulse Rate:  [47-79] 64 (02/10 1103) Resp:  [14-23] 14 (02/10 1103) BP: (71-118)/(55-105) 88/79 (02/10 1103) SpO2:  [82 %-98 %] 95 % (02/10 1103) Weight:  [223 lb 14.4 oz (101.6 kg)] 223 lb 14.4 oz (101.6 kg) (02/10 0452) Last BM Date: 11/10/17 Mean arterial Pressure 90  Intake/Output:   Intake/Output Summary (Last 24 hours) at 11/13/2017 1122 Last data filed at 11/13/2017 1103 Gross per 24 hour  Intake 1886.22 ml  Output 3850 ml  Net -1963.78 ml     Physical Exam     Physical Exam: CVP > 20  GENERAL: Well appearing this am. NAD.  HEENT: Normal. NECK: Supple, JVP 16+ cm. Carotids OK.  CARDIAC:  Mechanical heart sounds with LVAD hum present.  LUNGS: Decrease BS at bases.  ABDOMEN:  NT, moderate distention, no HSM. No bruits or masses. +BS  LVAD exit site: Well-healed and incorporated. Dressing dry and intact. No erythema or drainage. Stabilization device present and accurately applied. Driveline dressing changed daily per sterile technique. EXTREMITIES:  Warm and dry. No cyanosis, clubbing, rash, or edema.  NEUROLOGIC:  Alert & oriented x 3. Cranial nerves grossly intact. Moves all 4 extremities w/o difficulty. Affect pleasant      Telemetry   V-paced, artifact makes atrial rhythm difficult to discern.   Personally reviewed.   EKG   N/A  Labs   Basic Metabolic Panel: Recent Labs  Lab 11/09/17 1422 11/10/17 0415 11/11/17 0300 11/12/17 0459 11/13/17 0527  NA 135 134* 132* 130* 129*  K 4.5 4.3 3.6 3.4* 3.3*  CL 91* 90* 86* 81* 78*  CO2 34* 32 33* 36* 37*  GLUCOSE 105* 130* 201* 149* 105*  BUN 23* 22* 23* 23* 25*  CREATININE 1.47* 1.43* 1.46* 1.38* 1.34*  CALCIUM 8.8* 8.4* 8.2* 8.3* 8.3*  MG  --   --  2.2  --   --     Liver Function Tests: No results for input(s): AST, ALT, ALKPHOS, BILITOT, PROT, ALBUMIN in the last 168 hours. No results for input(s): LIPASE, AMYLASE in the last 168 hours. No results for input(s): AMMONIA in the last 168 hours.  CBC: Recent Labs  Lab 11/09/17 1422 11/10/17 0415 11/11/17 0300 11/12/17 0459 11/13/17 0527  WBC 6.0 5.9 6.2 5.6 6.1  HGB 8.6* 8.3* 8.0* 8.4* 8.1*  HCT 30.9* 29.5* 28.6* 29.6* 28.8*  MCV 76.3* 75.3* 75.5* 75.5* 75.8*  PLT 186 187 181 166 203    INR: Recent Labs  Lab 11/09/17 1422 11/10/17 0415 11/11/17 0300 11/12/17 0459 11/13/17 0527  INR 3.25 3.14 2.50 2.14 2.04    Other results:     Imaging   No results found.   Medications:  Scheduled Medications: . amiodarone  200 mg Oral Daily  . citalopram  20 mg Oral Daily  . digoxin  0.125 mg Oral Daily  . docusate sodium  200 mg Oral QHS  . furosemide  80 mg Intravenous TID  . gabapentin  300 mg Oral BID  . insulin aspart  0-15 Units Subcutaneous TID WC  . insulin aspart  5 Units Subcutaneous TID WC  . insulin glargine  10 Units Subcutaneous Q2200  . magnesium oxide  400 mg Oral Daily  . metolazone  2.5 mg Oral BID  . midodrine  5 mg Oral TID WC  . multivitamin with minerals  1 tablet Oral Daily  . pantoprazole  40 mg Oral BID AC  . potassium chloride SA  40 mEq Oral BID  . potassium chloride  40 mEq Oral Once  . sodium chloride flush  10-40 mL Intracatheter Q12H  . spironolactone  12.5 mg Oral Daily  . sucralfate  1 g Oral TID WC & HS  .  Warfarin - Pharmacist Dosing Inpatient   Does not apply q1800    Infusions: . DOBUTamine 7.5 mcg/kg/min (11/13/17 1103)    PRN Medications: acetaminophen, lidocaine (PF), ondansetron (ZOFRAN) IV, ondansetron, senna-docusate, sodium chloride flush, traMADol, zolpidem   Patient Profile   Ronald Milleris a 62 y.o.male with history of chronic systolic HF s/p Medtronic ICD 2014, CAD s/p CABG in 2000, HTN, Hx of cocaine abuse, Tobacco abuse, Depression, PAF, and COPD. HM3 placed 09/09/2016. In April 2018 he was placed on milrinone but later switched to dobutamine. He remains on dobutamine 7.39mcg.    Assessment/Plan:   1. 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 Has Medtronic ICD. He was admitted with intractable RV failure despite home dobutamine. He remains markedly volume overloaded with CVP > 20.  He is diuresing reasonably well and had paracentesis. Doppler MAP around 90.  Weak/lightheaded with standing, on midodrine.  LDH stable at 178, LVAD parameters stable.  - Continue dobutamine 7.5. - Lasix 80 mg IV tid + metolazone 2.5 bid today.  Replace K.  - Continue midodrine 5 mg tid.  - Not a good heart transplant candidate. Has reached end stage biventricular failure.  2. CAD: Stable, no chest pain.  3. DMII: Home regimen + sliding scale.  4. PAF: ?underlying atrial rhythm, will get ECG.  5. Anemia, iron deficiency: Had capsule endoscopy 04/2017 with severe gastritis. Continue carafate. Hgb 8.6>8.3>8>8.4.  6.  Anxiety/Depression  - Continue celexa.  7. H/O RUE DVT: warfarin 8. Chronic Anticoagulation: On coumadin, INR goal 2-2.5. INR 2.04 today. .  9. Cardiac cirrhosis with ascites: Already had paracentesis this admission.  Abdomen remains distended.  Would re-evaluate with [place] on Monday, ?repeat paracentesis.    I reviewed the LVAD parameters from today, and compared the results to the patient's prior recorded data.  No programming changes were made.  The LVAD  is functioning within specified parameters.  The patient performs LVAD self-test daily.  LVAD interrogation was negative for any significant power changes, alarms or PI events/speed drops.  LVAD equipment check completed and is in good working order.  Back-up equipment present.   LVAD education done on emergency procedures and precautions and reviewed exit site care.  Length of Stay: 4  Loralie Champagne, MD 11/13/2017, 11:22 AM  VAD Team --- VAD ISSUES ONLY--- Pager [phone] (7am - 7am)  Advanced Heart Failure Team  Pager [phone] (M-F; 7a - 4p)  Please contact [facility] for night-coverage after  hours (4p -7a ) and weekends on [email or web address]

## 2017-11-13 NOTE — Progress Notes (Signed)
Received call back from [facility], new order received for BMP to check K+, instructions given to find out K+ value before giving next dose of lasix

## 2017-11-13 NOTE — Progress Notes (Signed)
ANTICOAGULATION CONSULT NOTE - Follow Up Consult  Pharmacy Consult for warfarin Indication: LVAD  Allergies  Allergen Reactions  . Chlorhexidine Gluconate Itching and Rash  . Codeine Nausea And Vomiting and Other (See Comments)    In high doses  . Trazodone And Nefazodone Other (See Comments)    Dizziness with subsequent fall   . Lipitor [Atorvastatin] Nausea Only and Other (See Comments)    Nausea with high doses, tolerates 20mg  dose (08/22/16)  . Tape Other (See Comments)    Paper tape is ok    Patient Measurements: Height: 5\' 9"  (175.3 cm) Weight: 223 lb 14.4 oz (101.6 kg) IBW/kg (Calculated) : 70.7  Vital Signs: Temp: 98.1 F (36.7 C) (02/10 0722) Temp Source: Oral (02/10 0722) BP: 88/79 (02/10 1103) Pulse Rate: 64 (02/10 1103)  Labs: Recent Labs    11/11/17 0300 11/12/17 0459 11/13/17 0527  HGB 8.0* 8.4* 8.1*  HCT 28.6* 29.6* 28.8*  PLT 181 166 203  LABPROT 26.8* 23.7* 22.8*  INR 2.50 2.14 2.04  CREATININE 1.46* 1.38* 1.34*    Estimated Creatinine Clearance: 68 mL/min (A) (by C-G formula based on SCr of 1.34 mg/dL (H)).  Assessment: 62 year old male with HM3 placed 12/17. Patient with volume overload being admitted for diuresis. Patient has issues with bleeding in the past on warfarin so INR goal is lower for him. Last INR in clinic was at goal on 1/31.  Doses held for 2 days d/t supratherapeutic INRs, INR down to 2.04 today which is within goal. Warfarin restarted 2/8.   Hgb with slow trend down to 8. No bleeding reported.  FE studies low > feraheme 510mg  x1 given last week, dose 2 ordered for 2/11  PTA warfarin dose is 5mg  daily except 7.5mg  on Monday and Friday.   Goal of Therapy:  INR goal 1.8-2.4 Monitor platelets by anticoagulation protocol: Yes   Plan:  Restart home Warfarin 5mg  daily except 7.5mg  MF Daily INR   Bonnita Nasuti Pharm.D. CPP, BCPS Clinical Pharmacist [phone] 11/13/2017 2:08 PM

## 2017-11-13 NOTE — Progress Notes (Addendum)
CRITICAL VALUE ALERT  Critical Value: 102  Date & Time Notied: 11/13/17    1630  Provider Notified:Dr Mclean  Orders Received/Actions taken : Juice given , will recheck

## 2017-11-14 LAB — BASIC METABOLIC PANEL
ANION GAP: 14 (ref 5–15)
BUN: 29 mg/dL — ABNORMAL HIGH (ref 6–20)
CO2: 38 mmol/L — ABNORMAL HIGH (ref 22–32)
Calcium: 8.6 mg/dL — ABNORMAL LOW (ref 8.9–10.3)
Chloride: 81 mmol/L — ABNORMAL LOW (ref 101–111)
Creatinine, Ser: 1.43 mg/dL — ABNORMAL HIGH (ref 0.61–1.24)
GFR calc Af Amer: 60 mL/min — ABNORMAL LOW (ref >60)
GFR, EST NON AFRICAN AMERICAN: 51 mL/min — AB (ref >60)
Glucose, Bld: 109 mg/dL — ABNORMAL HIGH (ref 65–99)
Potassium: 4 mmol/L (ref 3.5–5.1)
SODIUM: 133 mmol/L — AB (ref 135–145)

## 2017-11-14 LAB — PROTIME-INR
INR: 1.71
PROTHROMBIN TIME: 19.9 s — AB (ref 11.4–15.2)

## 2017-11-14 LAB — CBC
HCT: 29.3 % — ABNORMAL LOW (ref 39.0–52.0)
HEMOGLOBIN: 8.3 g/dL — AB (ref 13.0–17.0)
MCH: 21.7 pg — ABNORMAL LOW (ref 26.0–34.0)
MCHC: 28.3 g/dL — ABNORMAL LOW (ref 30.0–36.0)
MCV: 76.7 fL — AB (ref 78.0–100.0)
PLATELETS: 198 10*3/uL (ref 150–400)
RBC: 3.82 MIL/uL — AB (ref 4.22–5.81)
RDW: 22.1 % — ABNORMAL HIGH (ref 11.5–15.5)
WBC: 7.4 10*3/uL (ref 4.0–10.5)

## 2017-11-14 LAB — GLUCOSE, CAPILLARY
GLUCOSE-CAPILLARY: 103 mg/dL — AB (ref 65–99)
GLUCOSE-CAPILLARY: 78 mg/dL (ref 65–99)
GLUCOSE-CAPILLARY: 141 mg/dL — AB (ref 65–99)
Glucose-Capillary: 90 mg/dL (ref 65–99)
Glucose-Capillary: 62 mg/dL — ABNORMAL LOW (ref 65–99)

## 2017-11-14 LAB — LACTATE DEHYDROGENASE: LDH: 153 U/L (ref 98–192)

## 2017-11-14 MED ORDER — MIDODRINE HCL 5 MG PO TABS
10.0000 mg | ORAL_TABLET | Freq: Three times a day (TID) | ORAL | Status: DC
  Administered 2017-11-14 – 2017-11-18 (×12): 10 mg via ORAL
  Filled 2017-11-14 (×12): qty 2

## 2017-11-14 MED ORDER — ACETAZOLAMIDE 250 MG PO TABS
250.0000 mg | ORAL_TABLET | Freq: Two times a day (BID) | ORAL | Status: DC
  Administered 2017-11-14 – 2017-11-15 (×4): 250 mg via ORAL
  Filled 2017-11-14 (×5): qty 1

## 2017-11-14 NOTE — Progress Notes (Signed)
LVAD Coordinator Rounding Note:  Admitted 9/7/18due fluid volume overload.   HM III LVAD implanted on 09/09/16 by Dr. Nancy Nordmann [employer].  Assisted patient to chair this morning. Patient states he feels hopeless and sad with his current situation. Emotional support provided. Will discuss with Kennyth Lose, CSW also. States he has a headache and feels he is "getting constipated". Asked the nurse to intervene.   Vital signs: Temp: 98.1 HR: 68 Doppler Pressure: 72 Automatic BP: 72/60 (66) O2 Sat: 96% on RA Wt in lbs: 239>226>230lbs   LVAD interrogation reveals:   Speed: 5900 Flow: 5.4 Power: 4.5w PI: 3.1 Alarms: none in last 24 hours Events: 2 PI events overnight Hematocrit: 29 Fixed speed: 5900 Low speed limit: 5600  Drive Line: Weekly. Next change due 11/16/17. Dressing CDI with anchor appropriately applied on assessment this morning.   Labs:  LDH trend: 139>150>153  INR trend: 3.14>2.5>1.71  Anticoagulation Plan: -INR Goal: 2.0 - 2.5 -ASA Dose: no ASA since hospitalization 04/29/17 for GI bleed; hx of gastritis  Gtts: Dobutamine 7.5 mcg/kg/min   Device: Medtronic BiV - DDD (lower rage 60)  -Therapies: off, end of service 08/2016   Adverse Events on VAD: Significant Events on VAD Support:  - 2/27 - 12/06/16>hospitalized for BiV HF; discharged home on Mil 0.125 - 4/20 - 01/28/17>hospitalized for HF; tunneled PICC placed R subclavian; transitioned to Dob 5 (poor Co-ox on mil) - 6/1 - 03/08/17>hospitalized for R>L CHF; Dobutamine 5 - 7/9 - 04/16/17> hospitalized for altered mental status and HF. Psych eval; Dobutamine increased to 6 - 7/27 - 05/09/17>Hospitalization with melena and recurrent HF. EGD shoed ulcers in stomach. Dobutamine increasedfrom 6 to 7.5 - 8/28 - 06/03/17>hospitalization with melena and planned IP capsule- negative - 11/09/17> hospitalized for HF; IV diuretics    Plan/Recommendations:  1. Please page VAD  coordinator for equipment issues or questions.   Balinda Quails RN, VAD Coordinator 24/7 pager [phone]

## 2017-11-14 NOTE — Progress Notes (Signed)
ANTICOAGULATION CONSULT NOTE - Follow Up Consult  Pharmacy Consult for warfarin Indication: LVAD  Allergies  Allergen Reactions  . Chlorhexidine Gluconate Itching and Rash  . Codeine Nausea And Vomiting and Other (See Comments)    In high doses  . Trazodone And Nefazodone Other (See Comments)    Dizziness with subsequent fall   . Lipitor [Atorvastatin] Nausea Only and Other (See Comments)    Nausea with high doses, tolerates 20mg  dose (08/22/16)  . Tape Other (See Comments)    Paper tape is ok    Patient Measurements: Height: 5\' 9"  (175.3 cm) Weight: 230 lb 6.1 oz (104.5 kg) IBW/kg (Calculated) : 70.7  Vital Signs: Temp: 97.4 F (36.3 C) (02/11 1255) Temp Source: Oral (02/11 1255) BP: 142/81 (02/11 1255) Pulse Rate: 90 (02/11 1255)  Labs: Recent Labs    11/12/17 0459 11/13/17 0527 11/13/17 1747 11/14/17 0635  HGB 8.4* 8.1*  --  8.3*  HCT 29.6* 28.8*  --  29.3*  PLT 166 203  --  198  LABPROT 23.7* 22.8*  --  19.9*  INR 2.14 2.04  --  1.71  CREATININE 1.38* 1.34* 1.58* 1.43*    Estimated Creatinine Clearance: 64.6 mL/min (A) (by C-G formula based on SCr of 1.43 mg/dL (H)).  Assessment: 62 year old male with HM3 placed 12/17. Patient with volume overload being admitted for diuresis. Patient has issues with bleeding in the past on warfarin so INR goal is lower for him. Last INR in clinic was at goal on 1/31.  Doses held for 2 days d/t supratherapeutic INRs, INR continues to trend down to 1.71 today, slightly below goal range. Warfarin restarted 2/8.   Hgb stable around 8.3, platelets are within normal limits. No bleeding reported.  FE studies low > feraheme 510mg  x1 given last week, dose 2 ordered for 2/11.  PTA warfarin dose is 5mg  daily except 7.5mg  on Monday and Friday.   Goal of Therapy:  INR goal 1.8-2.4 Monitor platelets by anticoagulation protocol: Yes   Plan:  Order warfarin 7.5 mg tonight If INR continues to trend due (likely due to previously held  doses), consider heparin bridge tomorrow Daily INR and CBC Monitor for signs/symptoms of bleeding.   Doylene Canard, PharmD Clinical Pharmacist  Pager: [phone] Clinical Phone for 11/14/2017 until 3:30pm: [phone] If after 3:30pm, please call main pharmacy at [phone] 11/14/2017 1:21 PM

## 2017-11-14 NOTE — Progress Notes (Signed)
Patient ID: Amman Bartel., male   DOB: January 08, 1956, 62 y.o.   MRN: [id number]   Advanced Heart Failure VAD Team Note  PCP-Cardiologist: No primary care provider on file.   Subjective:    Admitted with marked volume overload.   2/7 S/P Paracentesis 5.5 liters removed.  Weight down 3 lbs with diuresis again over the last day.   Remains dyspneic but somewhat improved. Abdomen still feels very full. Denies orthopnea or PND. Able to lie flat in bed,   CVP 19-20.  MAP 66 this am.   LVAD INTERROGATION:  HeartMate II LVAD:  Flow 5.6 liters/min, speed 5900, power 5.0, PI 2.4. 2 PI events.   Objective:    Vital Signs:   Temp:  [97.8 F (36.6 C)-98.6 F (37 C)] 98.1 F (36.7 C) (02/11 0819) Pulse Rate:  [63-98] 73 (02/11 0819) Resp:  [14-18] 18 (02/11 0819) BP: (71-93)/(46-79) 72/60 (02/11 0819) SpO2:  [83 %-97 %] 96 % (02/11 0819) Weight:  [230 lb 6.1 oz (104.5 kg)] 230 lb 6.1 oz (104.5 kg) (02/11 0411) Last BM Date: 11/10/17 Mean arterial Pressure 66 this am.   Intake/Output:   Intake/Output Summary (Last 24 hours) at 11/14/2017 1021 Last data filed at 11/14/2017 0823 Gross per 24 hour  Intake 469.31 ml  Output 1750 ml  Net -1280.69 ml     Physical Exam     Physical Exam: CVP 19-20 GENERAL: Lying in bed . NAD.  HEENT: Normal. Anicteric  NECK: Supple, JVP to jaw. Carotids OK.  CARDIAC:  Mechanical heart sounds with LVAD hum present.  LUNGS:  Diminished basilar sounds otherwise clear  ABDOMEN:  Distended, tight, non-tender, no HSM. No bruits or masses. +BS  LVAD exit site: Well-healed and incorporated. Dressing dry and intact. No erythema or drainage. Stabilization device present and accurately applied. Driveline dressing changed daily per sterile technique. EXTREMITIES:  Warm and dry. No cyanosis, clubbing, rash, or edema.  NEUROLOGIC:  Alert & oriented x 3. Cranial nerves grossly intact. Moves all 4 extremities w/o difficulty. Affect pleasant      Telemetry   V  paced, personally reviewed.   EKG   No new tracings.  Labs   Basic Metabolic Panel: Recent Labs  Lab 11/11/17 0300 11/12/17 0459 11/13/17 0527 11/13/17 1747 11/14/17 0635  NA 132* 130* 129* 132* 133*  K 3.6 3.4* 3.3* 4.7 4.0  CL 86* 81* 78* 83* 81*  CO2 33* 36* 37* 39* 38*  GLUCOSE 201* 149* 105* 100* 109*  BUN 23* 23* 25* 27* 29*  CREATININE 1.46* 1.38* 1.34* 1.58* 1.43*  CALCIUM 8.2* 8.3* 8.3* 8.5* 8.6*  MG 2.2  --   --   --   --     Liver Function Tests: No results for input(s): AST, ALT, ALKPHOS, BILITOT, PROT, ALBUMIN in the last 168 hours. No results for input(s): LIPASE, AMYLASE in the last 168 hours. No results for input(s): AMMONIA in the last 168 hours.  CBC: Recent Labs  Lab 11/10/17 0415 11/11/17 0300 11/12/17 0459 11/13/17 0527 11/14/17 0635  WBC 5.9 6.2 5.6 6.1 7.4  HGB 8.3* 8.0* 8.4* 8.1* 8.3*  HCT 29.5* 28.6* 29.6* 28.8* 29.3*  MCV 75.3* 75.5* 75.5* 75.8* 76.7*  PLT 187 181 166 203 198    INR: Recent Labs  Lab 11/10/17 0415 11/11/17 0300 11/12/17 0459 11/13/17 0527 11/14/17 0635  INR 3.14 2.50 2.14 2.04 1.71    Other results:     Imaging   No results found.   Medications:  Scheduled Medications: . acetaZOLAMIDE  250 mg Oral BID  . amiodarone  200 mg Oral Daily  . citalopram  20 mg Oral Daily  . digoxin  0.125 mg Oral Daily  . docusate sodium  200 mg Oral QHS  . furosemide  80 mg Intravenous TID  . gabapentin  300 mg Oral BID  . insulin aspart  0-15 Units Subcutaneous TID WC  . insulin aspart  5 Units Subcutaneous TID WC  . insulin glargine  10 Units Subcutaneous Q2200  . magnesium oxide  400 mg Oral Daily  . metolazone  2.5 mg Oral BID  . midodrine  10 mg Oral TID WC  . multivitamin with minerals  1 tablet Oral Daily  . pantoprazole  40 mg Oral BID AC  . potassium chloride SA  40 mEq Oral BID  . sodium chloride flush  10-40 mL Intracatheter Q12H  . spironolactone  12.5 mg Oral Daily  . sucralfate  1 g Oral  TID WC & HS  . warfarin  5 mg Oral Once per day on Sun Tue Wed Thu Sat  . warfarin  7.5 mg Oral Once per day on Mon Fri  . Warfarin - Pharmacist Dosing Inpatient   Does not apply q1800    Infusions: . DOBUTamine 7.5 mcg/kg/min (11/13/17 2024)  . ferumoxytol      PRN Medications: acetaminophen, lidocaine (PF), ondansetron (ZOFRAN) IV, ondansetron, senna-docusate, sodium chloride flush, traMADol, zolpidem   Patient Profile   ANANTH FIALLOS Jr.is a 62 y.o.male with history of chronic systolic HF s/p Medtronic ICD 2014, CAD s/p CABG in 2000, HTN, Hx of cocaine abuse, Tobacco abuse, Depression, PAF, and COPD. HM3 placed 09/09/2016. In April 2018 he was placed on milrinone but later switched to dobutamine. He remains on dobutamine 7.69mcg.    Assessment/Plan:   1. 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 Has Medtronic ICD. He was admitted with intractable RV failure despite home dobutamine. He remains markedly volume overloaded with CVP > 20.  He is diuresing reasonably well and had paracentesis. Doppler MAP around 90.  Weak/lightheaded with standing, on midodrine.  LDH stable at 178, LVAD parameters stable.  - Continue dobutamine 7.5. - Continue Lasix 80 mg IV TID with metolazone 2.5 bid today.  Replace K.  - Increase midodrine to 7.5 mg TID. - Not a good heart transplant candidate. Has reached end stage biventricular failure.  2. CAD:  - Stable. No s/s of ischemia.  3. DMII: Home regimen + sliding scale. No change.   4. PAF: ?underlying atrial rhythm - EKG 11/13/17 with profound artifact.  5. Anemia, iron deficiency: Had capsule endoscopy 04/2017 with severe gastritis. Continue carafate. Hgb 8.6>8.3>8>8.4 > 8.3 6.  Anxiety/Depression  - Continue celexa. Stable.  7. H/O RUE DVT: warfarin 8. Chronic Anticoagulation: On coumadin, INR goal 1.8 - 2.4.  - INR 1.71 this am. Will discuss bridging with MD. Doses held last week with supra-therapuetic INR 9. Cardiac  cirrhosis with ascites: Already had paracentesis this admission.  Abdomen remains distended.   - Repeat US +/- paracentesis tomorrow.  I reviewed the LVAD parameters from today, and compared the results to the patient's prior recorded data.  No programming changes were made.  The LVAD is functioning within specified parameters.  The patient performs LVAD self-test daily.  LVAD interrogation was negative for any significant power changes, alarms or PI events/speed drops.  LVAD equipment check completed and is in good working order.  Back-up equipment present.  LVAD education done on emergency procedures and precautions and reviewed exit site care.   Length of Stay: 5  Annamaria Helling 11/14/2017, 10:21 AM  VAD Team --- VAD ISSUES ONLY--- Pager [phone] (7am - 7am)  Advanced Heart Failure Team  Pager [phone] (M-F; 7a - 4p)  Please contact [facility] for night-coverage after hours (4p -7a ) and weekends on [email or web address]  Patient seen and examined with the above-signed Advanced Practice Provider and/or Housestaff. I personally reviewed laboratory data, imaging studies and relevant notes. I independently examined the patient and formulated the important aspects of the plan. I have edited the note to reflect any of my changes or salient points. I have personally discussed the plan with the patient and/or family.  Remains markedly volume overloaded with CVP 19 and marked ascites. Continue dobutamine for RV support. Continue IV lasix. Add diamox. Watch renal function closely. Will repeat ab u/s for possible repeat paracentesis. INR 1.7. Will hold off on heparin today (Discussed dosing with PharmD personally) if drops again tomorrow can start. VAD interrogated personally. Parameters stable. BP low. Increase midodrine.   Glori Bickers, MD  11:07 AM

## 2017-11-15 ENCOUNTER — Inpatient Hospital Stay (HOSPITAL_COMMUNITY): Payer: Medicare HMO

## 2017-11-15 ENCOUNTER — Encounter (HOSPITAL_COMMUNITY): Payer: Self-pay | Admitting: Student

## 2017-11-15 HISTORY — PX: IR PARACENTESIS: IMG2679

## 2017-11-15 LAB — AMMONIA: Ammonia: 58 umol/L — ABNORMAL HIGH (ref 9–35)

## 2017-11-15 LAB — GLUCOSE, CAPILLARY
GLUCOSE-CAPILLARY: 109 mg/dL — AB (ref 65–99)
GLUCOSE-CAPILLARY: 154 mg/dL — AB (ref 65–99)
Glucose-Capillary: 122 mg/dL — ABNORMAL HIGH (ref 65–99)
Glucose-Capillary: 77 mg/dL (ref 65–99)

## 2017-11-15 LAB — CBC WITH DIFFERENTIAL/PLATELET
BASOS PCT: 0 %
Basophils Absolute: 0 10*3/uL (ref 0.0–0.1)
EOS PCT: 2 %
Eosinophils Absolute: 0.1 10*3/uL (ref 0.0–0.7)
HCT: 30.2 % — ABNORMAL LOW (ref 39.0–52.0)
Hemoglobin: 8.5 g/dL — ABNORMAL LOW (ref 13.0–17.0)
Lymphocytes Relative: 6 %
Lymphs Abs: 0.4 10*3/uL — ABNORMAL LOW (ref 0.7–4.0)
MCH: 21.6 pg — AB (ref 26.0–34.0)
MCHC: 28.1 g/dL — ABNORMAL LOW (ref 30.0–36.0)
MCV: 76.8 fL — AB (ref 78.0–100.0)
MONO ABS: 0.8 10*3/uL (ref 0.1–1.0)
Monocytes Relative: 12 %
NEUTROS ABS: 5.5 10*3/uL (ref 1.7–7.7)
Neutrophils Relative %: 80 %
PLATELETS: 201 10*3/uL (ref 150–400)
RBC: 3.93 MIL/uL — ABNORMAL LOW (ref 4.22–5.81)
RDW: 22.9 % — AB (ref 11.5–15.5)
WBC: 6.8 10*3/uL (ref 4.0–10.5)

## 2017-11-15 LAB — HEPATIC FUNCTION PANEL
ALT: 18 U/L (ref 17–63)
AST: 31 U/L (ref 15–41)
Albumin: 3 g/dL — ABNORMAL LOW (ref 3.5–5.0)
Alkaline Phosphatase: 132 U/L — ABNORMAL HIGH (ref 38–126)
BILIRUBIN DIRECT: 0.2 mg/dL (ref 0.1–0.5)
BILIRUBIN TOTAL: 1.1 mg/dL (ref 0.3–1.2)
Indirect Bilirubin: 0.9 mg/dL (ref 0.3–0.9)
Total Protein: 6.4 g/dL — ABNORMAL LOW (ref 6.5–8.1)

## 2017-11-15 LAB — CULTURE, BODY FLUID W GRAM STAIN -BOTTLE: Culture: NO GROWTH

## 2017-11-15 LAB — BASIC METABOLIC PANEL
Anion gap: 13 (ref 5–15)
BUN: 28 mg/dL — ABNORMAL HIGH (ref 6–20)
CHLORIDE: 81 mmol/L — AB (ref 101–111)
CO2: 37 mmol/L — ABNORMAL HIGH (ref 22–32)
CREATININE: 1.46 mg/dL — AB (ref 0.61–1.24)
Calcium: 8.4 mg/dL — ABNORMAL LOW (ref 8.9–10.3)
GFR calc Af Amer: 58 mL/min — ABNORMAL LOW (ref >60)
GFR calc non Af Amer: 50 mL/min — ABNORMAL LOW (ref >60)
GLUCOSE: 141 mg/dL — AB (ref 65–99)
Potassium: 3.1 mmol/L — ABNORMAL LOW (ref 3.5–5.1)
SODIUM: 131 mmol/L — AB (ref 135–145)

## 2017-11-15 LAB — PROTIME-INR
INR: 1.6
Prothrombin Time: 18.9 seconds — ABNORMAL HIGH (ref 11.4–15.2)

## 2017-11-15 LAB — CULTURE, BODY FLUID-BOTTLE

## 2017-11-15 LAB — LACTATE DEHYDROGENASE: LDH: 127 U/L (ref 98–192)

## 2017-11-15 LAB — HEPARIN LEVEL (UNFRACTIONATED): Heparin Unfractionated: 0.82 IU/mL — ABNORMAL HIGH (ref 0.30–0.70)

## 2017-11-15 MED ORDER — WARFARIN SODIUM 5 MG PO TABS
5.0000 mg | ORAL_TABLET | Freq: Once | ORAL | Status: AC
  Administered 2017-11-15: 5 mg via ORAL

## 2017-11-15 MED ORDER — LIDOCAINE 2% (20 MG/ML) 5 ML SYRINGE
INTRAMUSCULAR | Status: AC
  Filled 2017-11-15: qty 10

## 2017-11-15 MED ORDER — HEPARIN (PORCINE) IN NACL 100-0.45 UNIT/ML-% IJ SOLN
1850.0000 [IU]/h | INTRAMUSCULAR | Status: DC
  Administered 2017-11-15: 1300 [IU]/h via INTRAVENOUS
  Administered 2017-11-16: 1650 [IU]/h via INTRAVENOUS
  Administered 2017-11-16: 1250 [IU]/h via INTRAVENOUS
  Administered 2017-11-17 – 2017-11-19 (×4): 1850 [IU]/h via INTRAVENOUS
  Filled 2017-11-15 (×8): qty 250

## 2017-11-15 MED ORDER — LIDOCAINE HCL (PF) 2 % IJ SOLN
INTRAMUSCULAR | Status: AC | PRN
  Administered 2017-11-15: 8 mL

## 2017-11-15 MED ORDER — WARFARIN SODIUM 7.5 MG PO TABS: 7.5000 mg | ORAL_TABLET | Freq: Once | ORAL | Status: DC

## 2017-11-15 NOTE — Progress Notes (Signed)
Pt Doppler MAP has been reading low. Most recent doppler of 60. Cuff pressure 71/55 (63). Pt asymptomatic, Cardiology notified. No additional orders at this time. All LVAD parameters are within normal defined limits and no alarms at this time. Will continue to monitor.

## 2017-11-15 NOTE — Progress Notes (Signed)
ANTICOAGULATION CONSULT NOTE - Follow Up Consult  Pharmacy Consult for warfarin Indication: LVAD  Allergies  Allergen Reactions  . Chlorhexidine Gluconate Itching and Rash  . Codeine Nausea And Vomiting and Other (See Comments)    In high doses  . Trazodone And Nefazodone Other (See Comments)    Dizziness with subsequent fall   . Lipitor [Atorvastatin] Nausea Only and Other (See Comments)    Nausea with high doses, tolerates 20mg  dose (08/22/16)  . Tape Other (See Comments)    Paper tape is ok    Patient Measurements: Height: 5\' 9"  (175.3 cm) Weight: 219 lb 12.8 oz (99.7 kg) IBW/kg (Calculated) : 70.7  Heparin Dose Weight: 94.8 kg   Vital Signs: Temp: 97.3 F (36.3 C) (02/12 0755) Temp Source: Oral (02/12 0755) BP: 94/74 (02/12 0755) Pulse Rate: 98 (02/12 0332)  Labs: Recent Labs    11/13/17 0527 11/13/17 1747 11/14/17 0635 11/15/17 0350  HGB 8.1*  --  8.3* 8.5*  HCT 28.8*  --  29.3* 30.2*  PLT 203  --  198 201  LABPROT 22.8*  --  19.9* 18.9*  INR 2.04  --  1.71 1.60  CREATININE 1.34* 1.58* 1.43* 1.46*    Estimated Creatinine Clearance: 61.9 mL/min (A) (by C-G formula based on SCr of 1.46 mg/dL (H)).  Assessment: 62 year old male with HM3 placed 12/17. Patient with volume overload being admitted for diuresis. Patient has issues with bleeding in the past on warfarin so INR goal is lower for him. Last INR in clinic was at goal on 1/31.  Doses held for 2 days d/t supratherapeutic INRs, INR continues to trend down from 1.71 to 1.6 today, slightly below goal range. Warfarin restarted 2/8.   Heparin infusion started due to second INR less than goal 1.8.   Hgb stable around 8.5, platelets are within normal limits. No bleeding reported.  PTA warfarin dose is 5mg  daily except 7.5mg  on Monday and Friday.   Goal of Therapy:  INR goal 1.8-2.4 Monitor platelets by anticoagulation protocol: Yes   Plan:  Order warfarin 5 mg tonight Start heparin infusion at 1300  units/hr  Obtain 6 hr heparin level Daily HL, INR, and CBC Monitor for signs/symptoms of bleeding.   Doylene Canard, PharmD Clinical Pharmacist  Pager: [phone] Clinical Phone for 11/15/2017 until 3:30pm: [phone] If after 3:30pm, please call main pharmacy at [phone] 11/15/2017 9:54 AM

## 2017-11-15 NOTE — Progress Notes (Signed)
ANTICOAGULATION CONSULT NOTE - Follow Up Consult  Pharmacy Consult for warfarin Indication: LVAD  Allergies  Allergen Reactions  . Chlorhexidine Gluconate Itching and Rash  . Codeine Nausea And Vomiting and Other (See Comments)    In high doses  . Trazodone And Nefazodone Other (See Comments)    Dizziness with subsequent fall   . Lipitor [Atorvastatin] Nausea Only and Other (See Comments)    Nausea with high doses, tolerates 20mg  dose (08/22/16)  . Tape Other (See Comments)    Paper tape is ok   Patient Measurements: Height: 5\' 9"  (175.3 cm) Weight: 219 lb 12.8 oz (99.7 kg) IBW/kg (Calculated) : 70.7  Heparin Dose Weight: 94.8 kg   Vital Signs: Temp: 97.3 F (36.3 C) (02/12 0755) Temp Source: Oral (02/12 0755) BP: 135/121 (02/12 1100) Pulse Rate: 98 (02/12 0332)  Labs: Recent Labs    11/13/17 0527 11/13/17 1747 11/14/17 0635 11/15/17 0350 11/15/17 1315  HGB 8.1*  --  8.3* 8.5*  --   HCT 28.8*  --  29.3* 30.2*  --   PLT 203  --  198 201  --   LABPROT 22.8*  --  19.9* 18.9*  --   INR 2.04  --  1.71 1.60  --   HEPARINUNFRC  --   --   --   --  0.82*  CREATININE 1.34* 1.58* 1.43* 1.46*  --     Estimated Creatinine Clearance: 61.9 mL/min (A) (by C-G formula based on SCr of 1.46 mg/dL (H)).  Assessment: 76 yoM admitted for AoCHF exacerbation, s/p HM3 placement 09/2016. On warfarin PTA with lower INR goal due to previous bleeding issues. Last INR in clinic on 1/31 was within goal range. Warfarin doses held x2 (2/6-2/7) d/t supratherapeutic INRs, then became subtherapeutic on 2/11. Heparin drip started 2/12. Initial heparin level above goal at 0.82. Hgb 8.5 stable, pltc WNL. No bleeding noted.  PTA warfarin dose: 5mg  daily except 7.5mg  on Mondays and Fridays   Goal of Therapy:  INR goal 1.8-2.4 Heparin level 0.3-0.7 units/mL Monitor platelets by anticoagulation protocol: Yes   Plan:  Decrease heparin drip to 1100 units/hr Recheck heparin level in 6 hours  Daily  heparin level, INR, and CBC Monitor for s/sx of bleeding  Megon Kalina N. Gerarda Fraction, PharmD PGY1 Pharmacy Resident Pager: [phone] 11/15/2017 2:28 PM

## 2017-11-15 NOTE — Care Management Important Message (Signed)
Important Message  Patient Details  Name: Ronald Miller. MRN: [id number] Date of Birth: 1956/08/17   Medicare Important Message Given:  Yes    Ramey Schiff 11/15/2017, 1:07 PM

## 2017-11-15 NOTE — Procedures (Signed)
PROCEDURE SUMMARY:  Successful US guided paracentesis from right lateral abdomen.  Yielded 5.8 liters of yellow fluid.  No immediate complications.  Pt tolerated well.   Specimen was not sent for labs.  Docia Barrier PA-C 11/15/2017 11:42 AM

## 2017-11-15 NOTE — Progress Notes (Signed)
Patient ID: Ronald Miller., male   DOB: 1956/01/10, 62 y.o.   MRN: [id number]   Advanced Heart Failure VAD Team Note  PCP-Cardiologist: No primary care provider on file.   Subjective:    Admitted with marked volume overload.   2/7 S/P Paracentesis 5.5 liters removed.    Negative 5.1 L with addition of diamox. Weight down 11 lbs.   Remains fatigued with distended abdomen. Denies orthopnea or PND. Able to lie flat in bed. Wants to go back to sleep.    CVP remains 18-19. MAP improved with increased midodrine.   LVAD INTERROGATION:  HeartMate II LVAD:  Flow 5.8 liters/min, speed 5900, power 5.0, PI 2.7. >40 PI events.   Objective:    Vital Signs:   Temp:  [97.3 F (36.3 C)-97.8 F (36.6 C)] 97.3 F (36.3 C) (02/12 0755) Pulse Rate:  [56-98] 98 (02/12 0332) Resp:  [13-21] 19 (02/12 0755) BP: (72-142)/(48-121) 135/121 (02/12 1100) SpO2:  [91 %-100 %] 94 % (02/12 0755) Weight:  [219 lb 12.8 oz (99.7 kg)] 219 lb 12.8 oz (99.7 kg) (02/12 0332) Last BM Date: 11/10/17 Mean arterial Pressure 83 this am after midodrine.   Intake/Output:   Intake/Output Summary (Last 24 hours) at 11/15/2017 1115 Last data filed at 11/15/2017 0811 Gross per 24 hour  Intake 120 ml  Output 4725 ml  Net -4605 ml     Physical Exam     Physical Exam: CVP 18-19  GENERAL: Fatigued appearing. Lying in bed.  HEENT: normal. Anicteric  NECK: Supple, JVP to jaw. Carotids OK.  CARDIAC:  Mechanical heart sounds with LVAD hum present.  LUNGS:  CTAB, normal effort.  ABDOMEN:  NT, markedly distended, no HSM. No bruits or masses. +BS  LVAD exit site: Well-healed and incorporated. Dressing dry and intact. No erythema or drainage. Stabilization device present and accurately applied. Driveline dressing changed daily per sterile technique. EXTREMITIES:  Warm and dry. No cyanosis, clubbing, rash, or edema.  NEUROLOGIC:  Alert & oriented x 3. Cranial nerves grossly intact. Moves all 4 extremities w/o difficulty.  Affect flat but appropriate.   Telemetry   V paced, personally reviewed.   EKG   No new tracings.  Labs   Basic Metabolic Panel: Recent Labs  Lab 11/11/17 0300 11/12/17 0459 11/13/17 0527 11/13/17 1747 11/14/17 0635 11/15/17 0350  NA 132* 130* 129* 132* 133* 131*  K 3.6 3.4* 3.3* 4.7 4.0 3.1*  CL 86* 81* 78* 83* 81* 81*  CO2 33* 36* 37* 39* 38* 37*  GLUCOSE 201* 149* 105* 100* 109* 141*  BUN 23* 23* 25* 27* 29* 28*  CREATININE 1.46* 1.38* 1.34* 1.58* 1.43* 1.46*  CALCIUM 8.2* 8.3* 8.3* 8.5* 8.6* 8.4*  MG 2.2  --   --   --   --   --     Liver Function Tests: No results for input(s): AST, ALT, ALKPHOS, BILITOT, PROT, ALBUMIN in the last 168 hours. No results for input(s): LIPASE, AMYLASE in the last 168 hours. No results for input(s): AMMONIA in the last 168 hours.  CBC: Recent Labs  Lab 11/11/17 0300 11/12/17 0459 11/13/17 0527 11/14/17 0635 11/15/17 0350  WBC 6.2 5.6 6.1 7.4 6.8  NEUTROABS  --   --   --   --  5.5  HGB 8.0* 8.4* 8.1* 8.3* 8.5*  HCT 28.6* 29.6* 28.8* 29.3* 30.2*  MCV 75.5* 75.5* 75.8* 76.7* 76.8*  PLT 181 166 203 198 201    INR: Recent Labs  Lab 11/11/17 0300  11/12/17 0459 11/13/17 0527 11/14/17 0635 11/15/17 0350  INR 2.50 2.14 2.04 1.71 1.60    Other results:     Imaging   No results found.   Medications:     Scheduled Medications: . acetaZOLAMIDE  250 mg Oral BID  . amiodarone  200 mg Oral Daily  . citalopram  20 mg Oral Daily  . digoxin  0.125 mg Oral Daily  . docusate sodium  200 mg Oral QHS  . furosemide  80 mg Intravenous TID  . gabapentin  300 mg Oral BID  . insulin aspart  0-15 Units Subcutaneous TID WC  . insulin aspart  5 Units Subcutaneous TID WC  . insulin glargine  10 Units Subcutaneous Q2200  . lidocaine      . magnesium oxide  400 mg Oral Daily  . midodrine  10 mg Oral TID WC  . multivitamin with minerals  1 tablet Oral Daily  . pantoprazole  40 mg Oral BID AC  . potassium chloride SA  40 mEq  Oral BID  . sodium chloride flush  10-40 mL Intracatheter Q12H  . spironolactone  12.5 mg Oral Daily  . sucralfate  1 g Oral TID WC & HS  . warfarin  5 mg Oral ONCE-1800  . Warfarin - Pharmacist Dosing Inpatient   Does not apply q1800    Infusions: . DOBUTamine 7.5 mcg/kg/min (11/15/17 0404)  . heparin 1,300 Units/hr (11/15/17 0734)    PRN Medications: acetaminophen, ondansetron (ZOFRAN) IV, ondansetron, senna-docusate, sodium chloride flush, traMADol, zolpidem   Patient Profile   Ronald Milleris a 62 y.o.male with history of chronic systolic HF s/p Medtronic ICD 2014, CAD s/p CABG in 2000, HTN, Hx of cocaine abuse, Tobacco abuse, Depression, PAF, and COPD. HM3 placed 09/09/2016. In April 2018 he was placed on milrinone but later switched to dobutamine. He remains on dobutamine 7.100mcg.    Assessment/Plan:   1. 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 Has Medtronic ICD. He was admitted with intractable RV failure despite home dobutamine. He remains markedly volume overloaded with CVP > 20.  He is diuresing reasonably well and had paracentesis. Doppler MAP around 90.  Weak/lightheaded with standing, on midodrine.  LDH stable at 178, LVAD parameters stable.  - Continue dobutamine 7.5 mcg/kg/min.  - Continue Lasix 80 mg IV TID.  Stop metolazone with low PI.  - Continue diamox.  - Increase midodrine to 10 mg TID.  - Not a good heart transplant candidate. Has reached end stage biventricular failure.  2. CAD:  - Stable. No s/s of ischemia.    3. DMII: Home regimen + sliding scale. No change.  4. PAF: ?underlying atrial rhythm - EKG 11/13/17 with profound artifact.  5. Anemia, iron deficiency: Had capsule endoscopy 04/2017 with severe gastritis. Continue carafate. Hgb 8.6>8.3>8>8.4 > 8.3 -> 8.5 6.  Anxiety/Depression  - Continue celexa. Stable.  7. H/O RUE DVT: warfarin 8. Chronic Anticoagulation: On coumadin, INR goal 1.8 - 2.4.  - INR 1.60 this am. Will  start low dose heparin without bolus.  9. Cardiac cirrhosis with ascites: Already had paracentesis this admission.  Abdomen remains distended.   - Repeat US +/- paracentesis this am with another 5.7 L out. .  I reviewed the LVAD parameters from today, and compared the results to the patient's prior recorded data.  No programming changes were made.  The LVAD is functioning within specified parameters.  The patient performs LVAD self-test daily.  LVAD interrogation was negative for  any significant power changes, alarms or PI events/speed drops.  LVAD equipment check completed and is in good working order.  Back-up equipment present.   LVAD education done on emergency procedures and precautions and reviewed exit site care.  Length of Stay: [address]  Annamaria Helling 11/15/2017, 11:15 AM  VAD Team --- VAD ISSUES ONLY--- Pager [phone] (7am - 7am)  Advanced Heart Failure Team  Pager [phone] (M-F; 7a - 4p)  Please contact [facility] for night-coverage after hours (4p -7a ) and weekends on [email or web address]  Patient seen and examined with the above-signed Advanced Practice Provider and/or Housestaff. I personally reviewed laboratory data, imaging studies and relevant notes. I independently examined the patient and formulated the important aspects of the plan. I have edited the note to reflect any of my changes or salient points. I have personally discussed the plan with the patient and/or family.  He is diuresing well with addition of diamox. Remains dobutamine dependent for RV failure. Multiple PI events on VAD. We turned down VAD speed to 5800. INR remains low. Will start low-dose heparin (no bolus). Discussed dosing with PharmD personally. Abdomen remains very distended for repeat paracentesis today.   Glori Bickers, MD  3:13 PM

## 2017-11-15 NOTE — Progress Notes (Signed)
LVAD Coordinator Rounding Note:  Admitted 9/7/18due fluid volume overload.   HM III LVAD implanted on 09/09/16 by Dr. Nancy Nordmann [employer].   Vital signs: Temp: 97.3 HR: 61 Doppler Pressure: 90 Automatic BP:94/74 (83) O2 Sat: 96% on RA Wt in lbs: 239>226>230>219lbs   LVAD interrogation reveals:   Speed: 5900 Flow: 5.8 Power: 5w PI:2.7 Alarms: none Events: 16 PI events in last 24 hours Hematocrit: 29 Fixed speed: 5900 Low speed limit: 5600  Drive Line: Weekly. Next change due 11/16/17. Dressing CDI with anchor appropriately applied on assessment this morning.   Labs:  LDH trend: 139>150>153>127  INR trend: 3.14>2.5>1.71>1.60  Anticoagulation Plan: -INR Goal: 2.0 - 2.5 -ASA Dose: no ASA since hospitalization 04/29/17 for GI bleed; hx of gastritis  Gtts: Dobutamine 7.5 mcg/kg/min Heparin gtt per pharmacy started 11/15/2017  Device: Medtronic BiV - DDD (lower rage 60)  -Therapies: off, end of service 08/2016   Adverse Events on VAD: Significant Events on VAD Support:  - 2/27 - 12/06/16>hospitalized for BiV HF; discharged home on Mil 0.125 - 4/20 - 01/28/17>hospitalized for HF; tunneled PICC placed R subclavian; transitioned to Dob 5 (poor Co-ox on mil) - 6/1 - 03/08/17>hospitalized for R>L CHF; Dobutamine 5 - 7/9 - 04/16/17> hospitalized for altered mental status and HF. Psych eval; Dobutamine increased to 6 - 7/27 - 05/09/17>Hospitalization with melena and recurrent HF. EGD shoed ulcers in stomach. Dobutamine increasedfrom 6 to 7.5 - 8/28 - 06/03/17>hospitalization with melena and planned IP capsule- negative - 11/09/17> hospitalized for HF; IV diuretics    Plan/Recommendations:  1. Please page VAD coordinator for equipment issues or questions.  2. VAD Coordinator will accompany patient to IR for paracentesis today. This has been scheduled for 1000.  Balinda Quails RN, VAD Coordinator 24/7 pager [phone]

## 2017-11-16 DIAGNOSIS — K7290 Hepatic failure, unspecified without coma: Secondary | ICD-10-CM

## 2017-11-16 LAB — BASIC METABOLIC PANEL
Anion gap: 14 (ref 5–15)
BUN: 28 mg/dL — AB (ref 6–20)
CO2: 35 mmol/L — AB (ref 22–32)
CREATININE: 1.47 mg/dL — AB (ref 0.61–1.24)
Calcium: 8.5 mg/dL — ABNORMAL LOW (ref 8.9–10.3)
Chloride: 81 mmol/L — ABNORMAL LOW (ref 101–111)
GFR calc Af Amer: 58 mL/min — ABNORMAL LOW (ref >60)
GFR calc non Af Amer: 50 mL/min — ABNORMAL LOW (ref >60)
GLUCOSE: 120 mg/dL — AB (ref 65–99)
Potassium: 3.2 mmol/L — ABNORMAL LOW (ref 3.5–5.1)
Sodium: 130 mmol/L — ABNORMAL LOW (ref 135–145)

## 2017-11-16 LAB — PROTIME-INR
INR: 1.64
PROTHROMBIN TIME: 19.3 s — AB (ref 11.4–15.2)

## 2017-11-16 LAB — GLUCOSE, CAPILLARY
GLUCOSE-CAPILLARY: 93 mg/dL (ref 65–99)
GLUCOSE-CAPILLARY: 134 mg/dL — AB (ref 65–99)
Glucose-Capillary: 135 mg/dL — ABNORMAL HIGH (ref 65–99)
Glucose-Capillary: 139 mg/dL — ABNORMAL HIGH (ref 65–99)

## 2017-11-16 LAB — CBC
HCT: 31.4 % — ABNORMAL LOW (ref 39.0–52.0)
Hemoglobin: 9.2 g/dL — ABNORMAL LOW (ref 13.0–17.0)
MCH: 22.3 pg — AB (ref 26.0–34.0)
MCHC: 29.3 g/dL — AB (ref 30.0–36.0)
MCV: 76 fL — ABNORMAL LOW (ref 78.0–100.0)
PLATELETS: 196 10*3/uL (ref 150–400)
RBC: 4.13 MIL/uL — ABNORMAL LOW (ref 4.22–5.81)
RDW: 24.3 % — AB (ref 11.5–15.5)
WBC: 6 10*3/uL (ref 4.0–10.5)

## 2017-11-16 LAB — LACTATE DEHYDROGENASE: LDH: 127 U/L (ref 98–192)

## 2017-11-16 LAB — HEPARIN LEVEL (UNFRACTIONATED)
HEPARIN UNFRACTIONATED: 0.11 [IU]/mL — AB (ref 0.30–0.70)
Heparin Unfractionated: 1.76 IU/mL — ABNORMAL HIGH (ref 0.30–0.70)
Heparin Unfractionated: <0.1 IU/mL — ABNORMAL LOW (ref 0.30–0.70)

## 2017-11-16 LAB — MAGNESIUM: MAGNESIUM: 2.5 mg/dL — AB (ref 1.7–2.4)

## 2017-11-16 MED ORDER — LACTULOSE 10 GM/15ML PO SOLN
20.0000 g | Freq: Two times a day (BID) | ORAL | Status: DC
  Administered 2017-11-16 – 2017-11-18 (×5): 20 g via ORAL
  Filled 2017-11-16 (×5): qty 30

## 2017-11-16 MED ORDER — SPIRONOLACTONE 12.5 MG HALF TABLET
12.5000 mg | ORAL_TABLET | Freq: Every day | ORAL | Status: DC
  Administered 2017-11-17: 12.5 mg via ORAL
  Filled 2017-11-16 (×2): qty 1

## 2017-11-16 MED ORDER — WARFARIN SODIUM 7.5 MG PO TABS
7.5000 mg | ORAL_TABLET | Freq: Once | ORAL | Status: AC
  Administered 2017-11-16: 7.5 mg via ORAL
  Filled 2017-11-16: qty 1

## 2017-11-16 NOTE — Progress Notes (Signed)
Patient ID: Genie Mirabal., male   DOB: Dec 15, 1955, 62 y.o.   MRN: [id number]   Advanced Heart Failure VAD Team Note  PCP-Cardiologist: No primary care provider on file.   Subjective:    Admitted with marked volume overload.   2/7 S/P Paracentesis 5.5 liters removed.   2/12 s/p Paracentesis 5.7 L removed  Negative 6.2 L (Though no I/O recorded).  Weight down 22 lbs. ? Accuracy but matches with output, and CVP down from 19 -> 9.   Fatigued and weak this am.  Mild confusion on waking up per RN. Currently alert to person, place, and time. Denies orthopnea. Lying flat on arrival. Feels weak. No dizziness.   CVP 9 this am. MAPs 60-70s overnight.   LVAD INTERROGATION:  HeartMate II LVAD:  Flow 5.5 liters/min, speed 5800, power 5.0, PI 2.9. >48 PI events.   Objective:    Vital Signs:   Temp:  [97.4 F (36.3 C)-98.2 F (36.8 C)] 97.9 F (36.6 C) (02/13 0805) Pulse Rate:  [43-122] 43 (02/13 0326) Resp:  [13-20] 17 (02/13 0805) BP: (69-135)/(20-121) 76/66 (02/13 0805) SpO2:  [89 %-100 %] 91 % (02/13 0326) Weight:  [197 lb 8.5 oz (89.6 kg)] 197 lb 8.5 oz (89.6 kg) (02/13 0331) Last BM Date: 11/10/17 Mean arterial Pressure 60s overnight, 72 this am after midodrine.   Intake/Output:   Intake/Output Summary (Last 24 hours) at 11/16/2017 0923 Last data filed at 11/16/2017 0547 Gross per 24 hour  Intake -  Output 5825 ml  Net -5825 ml     Physical Exam    Physical Exam: CVP 9  GENERAL: Fatigued.  NAD. Lying flat in bed  HEENT: Normal. NECK: Supple, JVP 8-9 cm. Carotids OK.  CARDIAC:  Mechanical heart sounds with LVAD hum present.  LUNGS:  CTAB, normal effort.  ABDOMEN: Obese, NT, distention much improved, no HSM. No bruits or masses. +BS  LVAD exit site: Well-healed and incorporated. Dressing dry and intact. No erythema or drainage. Stabilization device present and accurately applied. Driveline dressing changed daily per sterile technique. EXTREMITIES:  Warm and dry. No  cyanosis, clubbing, rash, or edema. + flapping tremor on exam. NEUROLOGIC:  Alert & oriented x 3. Cranial nerves grossly intact. Moves all 4 extremities w/o difficulty. Affect pleasant     Telemetry   V paced, personally reviewed.   EKG   No new tracings.    Labs   Basic Metabolic Panel: Recent Labs  Lab 11/11/17 0300  11/13/17 0527 11/13/17 1747 11/14/17 0635 11/15/17 0350 11/16/17 0239  NA 132*   < > 129* 132* 133* 131* 130*  K 3.6   < > 3.3* 4.7 4.0 3.1* 3.2*  CL 86*   < > 78* 83* 81* 81* 81*  CO2 33*   < > 37* 39* 38* 37* 35*  GLUCOSE 201*   < > 105* 100* 109* 141* 120*  BUN 23*   < > 25* 27* 29* 28* 28*  CREATININE 1.46*   < > 1.34* 1.58* 1.43* 1.46* 1.47*  CALCIUM 8.2*   < > 8.3* 8.5* 8.6* 8.4* 8.5*  MG 2.2  --   --   --   --   --   --    < > = values in this interval not displayed.    Liver Function Tests: Recent Labs  Lab 11/15/17 1200  AST 31  ALT 18  ALKPHOS 132*  BILITOT 1.1  PROT 6.4*  ALBUMIN 3.0*   No results for input(s): LIPASE,  AMYLASE in the last 168 hours. Recent Labs  Lab 11/15/17 1200  AMMONIA 58*    CBC: Recent Labs  Lab 11/12/17 0459 11/13/17 0527 11/14/17 0635 11/15/17 0350 11/16/17 0239  WBC 5.6 6.1 7.4 6.8 6.0  NEUTROABS  --   --   --  5.5  --   HGB 8.4* 8.1* 8.3* 8.5* 9.2*  HCT 29.6* 28.8* 29.3* 30.2* 31.4*  MCV 75.5* 75.8* 76.7* 76.8* 76.0*  PLT 166 203 198 201 196    INR: Recent Labs  Lab 11/12/17 0459 11/13/17 0527 11/14/17 0635 11/15/17 0350 11/16/17 0239  INR 2.14 2.04 1.71 1.60 1.64    Other results:     Imaging   Ir Paracentesis  Result Date: 11/15/2017 INDICATION: Patient with recurrent ascites. Request is made for therapeutic paracentesis. EXAM: ULTRASOUND GUIDED THERAPEUTIC PARACENTESIS MEDICATIONS: 10 mL 2% lidocaine COMPLICATIONS: None immediate. PROCEDURE: Informed written consent was obtained from the patient after a discussion of the risks, benefits and alternatives to treatment. A  timeout was performed prior to the initiation of the procedure. Initial ultrasound scanning demonstrates a large amount of ascites within the right lower abdominal quadrant. The right lower abdomen was prepped and draped in the usual sterile fashion. 2% lidocaine was used for local anesthesia. Following this, a 19 gauge, 7-cm, Yueh catheter was introduced. An ultrasound image was saved for documentation purposes. The paracentesis was performed. The catheter was removed and a dressing was applied. The patient tolerated the procedure well without immediate post procedural complication. FINDINGS: A total of approximately 5.8 liters of yellow fluid was removed. IMPRESSION: Successful ultrasound-guided paracentesis yielding 5.8 liters of peritoneal fluid. Read by: Brynda Greathouse PA-C Electronically Signed   By: Markus Daft M.D.   On: 11/15/2017 11:44     Medications:     Scheduled Medications: . amiodarone  200 mg Oral Daily  . citalopram  20 mg Oral Daily  . digoxin  0.125 mg Oral Daily  . docusate sodium  200 mg Oral QHS  . gabapentin  300 mg Oral BID  . insulin aspart  0-15 Units Subcutaneous TID WC  . insulin aspart  5 Units Subcutaneous TID WC  . insulin glargine  10 Units Subcutaneous Q2200  . magnesium oxide  400 mg Oral Daily  . midodrine  10 mg Oral TID WC  . multivitamin with minerals  1 tablet Oral Daily  . pantoprazole  40 mg Oral BID AC  . potassium chloride SA  40 mEq Oral BID  . sodium chloride flush  10-40 mL Intracatheter Q12H  . spironolactone  12.5 mg Oral Daily  . sucralfate  1 g Oral TID WC & HS  . Warfarin - Pharmacist Dosing Inpatient   Does not apply q1800    Infusions: . DOBUTamine 7.5 mcg/kg/min (11/16/17 0115)  . heparin 1,250 Units/hr (11/16/17 0115)    PRN Medications: acetaminophen, ondansetron (ZOFRAN) IV, ondansetron, senna-docusate, sodium chloride flush, traMADol, zolpidem   Patient Profile   MELITON SAMAD Jr.is a 62 y.o.male with history of  chronic systolic HF s/p Medtronic ICD 2014, CAD s/p CABG in 2000, HTN, Hx of cocaine abuse, Tobacco abuse, Depression, PAF, and COPD. HM3 placed 09/09/2016. In April 2018 he was placed on milrinone but later switched to dobutamine. He remains on dobutamine 7.24mcg.   Assessment/Plan:    1. 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 Has Medtronic ICD. He was admitted with intractable RV failure despite home dobutamine. He remains markedly volume overloaded with CVP >  20.  He is diuresing reasonably well and had paracentesis. Doppler MAP around 90.  Weak/lightheaded with standing, on midodrine.  LDH stable at 178, LVAD parameters stable.  - Continue dobutamine 7.5 mcg/kg/min.  - Hold lasix and diamox this am with lightheadedness and lower CVP.   - Continue midodrine to 10 mg TID.  - Not a good heart transplant candidate. Has reached end stage biventricular failure.  2. CAD:  - Stable. No s/s of ischemia.    3. DMII: Home regimen + sliding scale. No change. 4. PAF: ?underlying atrial rhythm - EKG 11/13/17 with profound artifact.  5. Anemia, iron deficiency: Had capsule endoscopy 04/2017 with severe gastritis. Continue carafate. Hgb 8.6>8.3>8>8.4 > 8.3 -> 8.5 -> 9.2 6.  Anxiety/Depression  - Continue celexa. Stable. .  7. H/O RUE DVT: warfarin 8. Chronic Anticoagulation: On coumadin, INR goal 1.8 - 2.4.  - INR 1.64 this am. Continue low dose heparin without bolus.  9. Cardiac cirrhosis with ascites: Already had paracentesis this admission.  Abdomen remains distended.   - Repeat US +/- paracentesis this am with another 5.7 L out.  10. Confusion/Hepatic Encephalopathy - Ammonia 58 11/15/17. + asterixis on exam - Start lactulose 20 mg BID  I reviewed the LVAD parameters from today, and compared the results to the patient's prior recorded data.  No programming changes were made.  The LVAD is functioning within specified parameters.  The patient performs LVAD self-test daily.  LVAD  interrogation was negative for any significant power changes, alarms or PI events/speed drops.  LVAD equipment check completed and is in good working order.  Back-up equipment present.   LVAD education done on emergency procedures and precautions and reviewed exit site care.   Length of Stay: [address]  Annamaria Helling 11/16/2017, 9:23 AM  VAD Team --- VAD ISSUES ONLY--- Pager [phone] (7am - 7am)  Advanced Heart Failure Team  Pager [phone] (M-F; 7a - 4p)  Please contact [facility] for night-coverage after hours (4p -7a ) and weekends on [email or web address]  Patient seen and examined with the above-signed Advanced Practice Provider and/or Housestaff. I personally reviewed laboratory data, imaging studies and relevant notes. I independently examined the patient and formulated the important aspects of the plan. I have edited the note to reflect any of my changes or salient points. I have personally discussed the plan with the patient and/or family.  He underwent repeat paracentesis yesterday with > 5 L out. Remains on dobutamine for RV support. Feels weak. CVP 9. MAP 74 (checked personally) Ammonia mildly elevated with asterixis on exam. Will start lactulose at 20 bid. Hold all diuretics. VAD parameters reviewed personally. Multiple PI events. Speed turned down personally.  INR low. On heparin. Discussed dosing with PharmD personally.  Glori Bickers, MD  11:12 AM

## 2017-11-16 NOTE — Progress Notes (Signed)
ANTICOAGULATION CONSULT NOTE - Follow Up Consult  Pharmacy Consult for Heparin/Warfarin Indication: LVAD  Allergies  Allergen Reactions  . Chlorhexidine Gluconate Itching and Rash  . Codeine Nausea And Vomiting and Other (See Comments)    In high doses  . Trazodone And Nefazodone Other (See Comments)    Dizziness with subsequent fall   . Lipitor [Atorvastatin] Nausea Only and Other (See Comments)    Nausea with high doses, tolerates 20mg  dose (08/22/16)  . Tape Other (See Comments)    Paper tape is ok   Patient Measurements: Height: 5\' 9"  (175.3 cm) Weight: 219 lb 12.8 oz (99.7 kg) IBW/kg (Calculated) : 70.7  Heparin Dose Weight: 94.8 kg   Vital Signs: Temp: 98 F (36.7 C) (02/12 2302) Temp Source: Oral (02/12 2302) BP: 71/55 (02/12 2314) Pulse Rate: 122 (02/12 2302)  Labs: Recent Labs    11/13/17 0527 11/13/17 1747 11/14/17 4010 11/15/17 0350 11/15/17 1315 11/15/17 2140 11/15/17 2328  HGB 8.1*  --  8.3* 8.5*  --   --   --   HCT 28.8*  --  29.3* 30.2*  --   --   --   PLT 203  --  198 201  --   --   --   LABPROT 22.8*  --  19.9* 18.9*  --   --   --   INR 2.04  --  1.71 1.60  --   --   --   HEPARINUNFRC  --   --   --   --  0.82* 1.76* <0.10*  CREATININE 1.34* 1.58* 1.43* 1.46*  --   --   --     Estimated Creatinine Clearance: 61.9 mL/min (A) (by C-G formula based on SCr of 1.46 mg/dL (H)).  Assessment: 29 yoM admitted for AoCHF exacerbation, s/p HM3 placement 09/2016. On warfarin PTA with lower INR goal due to previous bleeding issues. Last INR in clinic on 1/31 was within goal range. Warfarin doses held x2 (2/6-2/7) d/t supratherapeutic INRs, then became subtherapeutic on 2/11. Heparin drip started 2/12. Initial heparin level above goal at 0.82. Hgb 8.5 stable, pltc WNL. No bleeding noted.  PTA warfarin dose: 5mg  daily except 7.5mg  on Mondays and Fridays   2/13: Heparin level undetectable after rate decrease, lab drawn peripherally and heparin running through  PICC  Goal of Therapy:  INR goal 1.8-2.4 Heparin level 0.3-0.7 units/mL Monitor platelets by anticoagulation protocol: Yes   Plan:  Inc heparin to 1250 units/hr 0900 HL  Narda Bonds, PharmD, BCPS Clinical Pharmacist Phone: [phone]

## 2017-11-16 NOTE — Care Management Note (Signed)
Case Management Note  Patient Details  Name: Ronald Miller. MRN: [id number] Date of Birth: 06/09/1956  Subjective/Objective:       Pt admitted with HF - LVAD implantation in 2017              Action/Plan:   PTA independent from home on dobutamine supplied by [facility]   Expected Discharge Date:                  Expected Discharge Plan:  Home/Self Care(independent from home)  In-House Referral:     Discharge planning Services  CM Consult  Post Acute Care Choice:    Choice offered to:     DME Arranged:    DME Agency:     HH Arranged:    [place] Agency:     Status of Service:  In process, will continue to follow  If discussed at Long Length of Stay Meetings, dates discussed:    Additional Comments:  Maryclare Labrador, RN 11/16/2017, 10:37 AM

## 2017-11-16 NOTE — Progress Notes (Signed)
LVAD Coordinator Rounding Note:  Admitted 9/7/18due fluid volume overload.   HM III LVAD implanted on 09/09/16 by Dr. Nancy Nordmann Destination Therapycriteria.  Patient lying in bed this morning. States he doesn't feel well and had a "rough night".  Vital signs: Temp: 97.9 HR: 80 Doppler Pressure:60 Automatic BP:76/66 (72) O2 Sat: 91% on RA Wt in lbs: 239>226>230>219>197lbs   LVAD interrogation reveals:   Speed: 5800 Flow: 5.8 Power: 4.6w PI:2.6 Alarms: none Events:>48 PI events in last 24 hours Hematocrit: 29 Fixed speed: 5800 Low speed limit: 5500  Drive Line: Weekly. Next change due TODAY.Dressing CDI with anchor appropriately applied on assessment this morning.Will change personally once dressing kit arrives.  Labs:  LDH trend: 139>150>153>127>127  INR trend: 3.14>2.5>1.71>1.60>1.64  Anticoagulation Plan: -INR Goal: 2.0 - 2.5 -ASA Dose: no ASA since hospitalization 04/29/17 for GI bleed; hx of gastritis  Gtts: Dobutamine 7.5 mcg/kg/min Heparin gtt per pharmacy started 11/15/2017  Device: Medtronic BiV - DDD (lower rage 60)  -Therapies: off, end of service 08/2016   Adverse Events on VAD: Significant Events on VAD Support:  - 2/27 - 12/06/16>hospitalized for BiV HF; discharged home on Mil 0.125 - 4/20 - 01/28/17>hospitalized for HF; tunneled PICC placed R subclavian; transitioned to Dob 5 (poor Co-ox on mil) - 6/1 - 03/08/17>hospitalized for R>L CHF; Dobutamine 5 - 7/9 - 04/16/17> hospitalized for altered mental status and HF. Psych eval; Dobutamine increased to 6 - 7/27 - 05/09/17>Hospitalization with melena and recurrent HF. EGD shoed ulcers in stomach. Dobutamine increasedfrom 6 to 7.5 - 8/28 - 06/03/17>hospitalization with melena and planned IP capsule- negative - 11/09/17> hospitalized for HF; IV diuretics    Plan/Recommendations:  1. Please page VAD coordinator for equipment issues or questions.  2. VAD Coordinator will  change drive line dressing today.  Balinda Quails RN, VAD Coordinator 24/7 pager [phone]

## 2017-11-16 NOTE — Progress Notes (Signed)
Patient presents to clinic today for drive line exit wound care. Existing VAD dressing removed and site care performed using sterile technique. Drive line exit site cleaned with betadine applicators x 2, allowed to dry, and Sorbaview dressing with bio patch re-applied. Exit site healed and incorporated, the velour is fully implanted at exit site. No redness, tenderness, drainage, foul odor or rash noted. Drive line anchor re-applied.   Balinda Quails RN, VAD Coordinator 24/7 pager [phone]

## 2017-11-16 NOTE — Progress Notes (Signed)
ANTICOAGULATION CONSULT NOTE - Follow Up Consult  Pharmacy Consult for Heparin/Warfarin Indication: LVAD  Allergies  Allergen Reactions  . Chlorhexidine Gluconate Itching and Rash  . Codeine Nausea And Vomiting and Other (See Comments)    In high doses  . Trazodone And Nefazodone Other (See Comments)    Dizziness with subsequent fall   . Lipitor [Atorvastatin] Nausea Only and Other (See Comments)    Nausea with high doses, tolerates 20mg  dose (08/22/16)  . Tape Other (See Comments)    Paper tape is ok   Patient Measurements: Height: 5\' 9"  (175.3 cm) Weight: 197 lb 8.5 oz (89.6 kg) IBW/kg (Calculated) : 70.7  Heparin Dose Weight: 94.8 kg   Vital Signs: Temp: 97.9 F (36.6 C) (02/13 0805) Temp Source: Oral (02/13 0805) BP: 76/66 (02/13 0805) Pulse Rate: 43 (02/13 0326)  Labs: Recent Labs    11/14/17 7106 11/15/17 0350  11/15/17 2140 11/15/17 2328 11/16/17 0239 11/16/17 1118  HGB 8.3* 8.5*  --   --   --  9.2*  --   HCT 29.3* 30.2*  --   --   --  31.4*  --   PLT 198 201  --   --   --  196  --   LABPROT 19.9* 18.9*  --   --   --  19.3*  --   INR 1.71 1.60  --   --   --  1.64  --   HEPARINUNFRC  --   --    < > 1.76* <0.10*  --  <0.10*  CREATININE 1.43* 1.46*  --   --   --  1.47*  --    < > = values in this interval not displayed.    Estimated Creatinine Clearance: 58.4 mL/min (A) (by C-G formula based on SCr of 1.47 mg/dL (H)).  Assessment: 64 yoM admitted for AoCHF exacerbation, s/p HM3 placement 09/2016. On warfarin PTA with lower INR goal due to previous bleeding issues. Last INR in clinic on 1/31 was within goal range. Warfarin doses held x2 (2/6-2/7) d/t supratherapeutic INRs, then became subtherapeutic on 2/11. Heparin drip started 2/12.  PTA warfarin dose: 5mg  daily except 7.5mg  on Mondays and Fridays   INR today is stable from 1.6 to 1.64. Repeat heparin level continues to remain detectable. Confirmed with nursing that level was drawn peripherally and heparin  is running through PICC line. Hgb is stable at 9.2, platelets are within normal limits. No signs/symptoms of bleeding. No interruptions or issues with infusion.   Goal of Therapy:  INR goal 1.8-2.4 Heparin level 0.3-0.7 units/mL Monitor platelets by anticoagulation protocol: Yes   Plan:  Increase heparin infusion to 1450 units/hr Obtain heparin level in 6 hours Warfarin 7.5 mg once tonight  Monitor daily INR, HL, and CBC Monitor for signs/symptoms of bleeding  Doylene Canard, PharmD Clinical Pharmacist  Pager: [phone] Clinical Phone for 11/16/2017 until 3:30pm: [phone] If after 3:30pm, please call main pharmacy at [phone]

## 2017-11-16 NOTE — Progress Notes (Signed)
ANTICOAGULATION CONSULT NOTE - Follow Up Consult  Pharmacy Consult for Heparin/Warfarin Indication: LVAD  Allergies  Allergen Reactions  . Chlorhexidine Gluconate Itching and Rash  . Codeine Nausea And Vomiting and Other (See Comments)    In high doses  . Trazodone And Nefazodone Other (See Comments)    Dizziness with subsequent fall   . Lipitor [Atorvastatin] Nausea Only and Other (See Comments)    Nausea with high doses, tolerates 20mg  dose (08/22/16)  . Tape Other (See Comments)    Paper tape is ok   Patient Measurements: Height: 5\' 9"  (175.3 cm) Weight: 197 lb 8.5 oz (89.6 kg) IBW/kg (Calculated) : 70.7  Heparin Dose Weight: 94.8 kg   Vital Signs: Temp: 97.5 F (36.4 C) (02/13 1921) Temp Source: Oral (02/13 1921) BP: 88/76 (02/13 1921) Pulse Rate: 59 (02/13 1921)  Labs: Recent Labs    11/14/17 0635 11/15/17 0350  11/15/17 2328 11/16/17 0239 11/16/17 1118 11/16/17 1858  HGB 8.3* 8.5*  --   --  9.2*  --   --   HCT 29.3* 30.2*  --   --  31.4*  --   --   PLT 198 201  --   --  196  --   --   LABPROT 19.9* 18.9*  --   --  19.3*  --   --   INR 1.71 1.60  --   --  1.64  --   --   HEPARINUNFRC  --   --    < > <0.10*  --  <0.10* 0.11*  CREATININE 1.43* 1.46*  --   --  1.47*  --   --    < > = values in this interval not displayed.    Estimated Creatinine Clearance: 58.4 mL/min (A) (by C-G formula based on SCr of 1.47 mg/dL (H)).  Assessment: 4 yoM admitted for AoCHF exacerbation, s/p HM3 placement 09/2016. On warfarin PTA with lower INR goal due to previous bleeding issues. Last INR in clinic on 1/31 was within goal range. Warfarin doses held x2 (2/6-2/7) d/t supratherapeutic INRs, then became subtherapeutic on 2/11. Heparin drip started 2/12.  PTA warfarin dose: 5mg  daily except 7.5mg  on Mondays and Fridays   INR today is stable 1.64.Confirmed with nursing that level was drawn peripherally and heparin is running through PICC line. Hgb is stable at 9.2, platelets are  within normal limits. No signs/symptoms of bleeding. No interruptions or issues with infusion.   Heparin level starting to bump 0.11 on drip rate 1450 uts/hr  Goal of Therapy:  INR goal 1.8-2.4 Heparin level 0.3-0.7 units/mL Monitor platelets by anticoagulation protocol: Yes   Plan:  Increase heparin infusion to 1650 units/hr  Monitor daily INR, HL, and CBC Monitor for signs/symptoms of bleeding  Bonnita Nasuti Pharm.D. CPP, BCPS Clinical Pharmacist [phone] 11/16/2017 9:36 PM

## 2017-11-17 LAB — BASIC METABOLIC PANEL
Anion gap: 13 (ref 5–15)
BUN: 25 mg/dL — AB (ref 6–20)
CO2: 30 mmol/L (ref 22–32)
CREATININE: 1.43 mg/dL — AB (ref 0.61–1.24)
Calcium: 8.6 mg/dL — ABNORMAL LOW (ref 8.9–10.3)
Chloride: 87 mmol/L — ABNORMAL LOW (ref 101–111)
GFR calc Af Amer: 60 mL/min — ABNORMAL LOW (ref >60)
GFR, EST NON AFRICAN AMERICAN: 51 mL/min — AB (ref >60)
Glucose, Bld: 119 mg/dL — ABNORMAL HIGH (ref 65–99)
Potassium: 4 mmol/L (ref 3.5–5.1)
SODIUM: 130 mmol/L — AB (ref 135–145)

## 2017-11-17 LAB — LACTATE DEHYDROGENASE: LDH: 164 U/L (ref 98–192)

## 2017-11-17 LAB — PROTIME-INR
INR: 1.36
Prothrombin Time: 16.6 seconds — ABNORMAL HIGH (ref 11.4–15.2)

## 2017-11-17 LAB — CBC
HCT: 33.3 % — ABNORMAL LOW (ref 39.0–52.0)
Hemoglobin: 9.9 g/dL — ABNORMAL LOW (ref 13.0–17.0)
MCH: 23 pg — ABNORMAL LOW (ref 26.0–34.0)
MCHC: 29.7 g/dL — ABNORMAL LOW (ref 30.0–36.0)
MCV: 77.3 fL — ABNORMAL LOW (ref 78.0–100.0)
PLATELETS: 206 10*3/uL (ref 150–400)
RBC: 4.31 MIL/uL (ref 4.22–5.81)
RDW: 25.3 % — AB (ref 11.5–15.5)
WBC: 5.8 10*3/uL (ref 4.0–10.5)

## 2017-11-17 LAB — GLUCOSE, CAPILLARY
GLUCOSE-CAPILLARY: 94 mg/dL (ref 65–99)
GLUCOSE-CAPILLARY: 202 mg/dL — AB (ref 65–99)
Glucose-Capillary: 123 mg/dL — ABNORMAL HIGH (ref 65–99)
Glucose-Capillary: 92 mg/dL (ref 65–99)

## 2017-11-17 LAB — HEPARIN LEVEL (UNFRACTIONATED)
HEPARIN UNFRACTIONATED: 0.43 [IU]/mL (ref 0.30–0.70)
Heparin Unfractionated: 0.22 IU/mL — ABNORMAL LOW (ref 0.30–0.70)

## 2017-11-17 MED ORDER — WARFARIN SODIUM 10 MG PO TABS
10.0000 mg | ORAL_TABLET | Freq: Once | ORAL | Status: AC
  Administered 2017-11-17: 10 mg via ORAL
  Filled 2017-11-17: qty 1

## 2017-11-17 MED ORDER — WARFARIN SODIUM 7.5 MG PO TABS: 7.5000 mg | ORAL_TABLET | Freq: Once | ORAL | Status: DC

## 2017-11-17 NOTE — Progress Notes (Addendum)
ANTICOAGULATION CONSULT NOTE - Follow Up Consult  Pharmacy Consult for Heparin/Warfarin Indication: LVAD  Allergies  Allergen Reactions  . Chlorhexidine Gluconate Itching and Rash  . Codeine Nausea And Vomiting and Other (See Comments)    In high doses  . Trazodone And Nefazodone Other (See Comments)    Dizziness with subsequent fall   . Lipitor [Atorvastatin] Nausea Only and Other (See Comments)    Nausea with high doses, tolerates 20mg  dose (08/22/16)  . Tape Other (See Comments)    Paper tape is ok   Patient Measurements: Height: 5\' 9"  (175.3 cm) Weight: 197 lb 5 oz (89.5 kg) IBW/kg (Calculated) : 70.7  Heparin Dose Weight: 94.8 kg   Vital Signs: Temp: 98 F (36.7 C) (02/14 0815) Temp Source: Oral (02/14 0815) BP: 91/60 (02/14 0815) Pulse Rate: 73 (02/14 1046)  Labs: Recent Labs    11/15/17 0350  11/16/17 0239  11/16/17 1858 11/17/17 0228 11/17/17 1149  HGB 8.5*  --  9.2*  --   --  9.9*  --   HCT 30.2*  --  31.4*  --   --  33.3*  --   PLT 201  --  196  --   --  206  --   LABPROT 18.9*  --  19.3*  --   --  16.6*  --   INR 1.60  --  1.64  --   --  1.36  --   HEPARINUNFRC  --    < >  --    < > 0.11* 0.22* 0.43  CREATININE 1.46*  --  1.47*  --   --  1.43*  --    < > = values in this interval not displayed.    Estimated Creatinine Clearance: 60 mL/min (A) (by C-G formula based on SCr of 1.43 mg/dL (H)).  Assessment: 42 yoM admitted for AoCHF exacerbation, s/p HM3 placement 09/2016. On warfarin PTA with lower INR goal due to previous bleeding issues. Last INR in clinic on 1/31 was within goal range. Warfarin doses held x2 (2/6-2/7) d/t supratherapeutic INRs, then became subtherapeutic on 2/11. Heparin drip started 2/12.  PTA warfarin dose: 5mg  daily except 7.5mg  on Mondays and Fridays   INR today is 1.64 to 1.36. Repeat heparin level today is therapeutic at 0.43, on 1850 units/hr. Hgb is stable at 9.9, platelets are within normal limits. No signs/symptoms of  bleeding. No interruptions or issues with infusion.   Goal of Therapy:  INR goal 1.8-2.4 Heparin level 0.3-0.7 units/mL Monitor platelets by anticoagulation protocol: Yes   Plan:  Continue heparin infusion at 1850 units/hr Warfarin 10 mg once tonight  Monitor daily INR, HL, and CBC Monitor for signs/symptoms of bleeding  Doylene Canard, PharmD Clinical Pharmacist  Pager: [phone] Clinical Phone for 11/17/2017 until 3:30pm: [phone] If after 3:30pm, please call main pharmacy at [phone]

## 2017-11-17 NOTE — Progress Notes (Signed)
LVAD Coordinator Rounding Note:  Admitted 9/7/18due fluid volume overload.   HM III LVAD implanted on 09/09/16 by Dr. Nancy Nordmann Destination Therapycriteria.  Vital signs: Temp:98 HR: 62 Doppler Pressure:76 Automatic BP:91/60 (72) O2 Sat: 93% on RA Wt in lbs: 239>226>230>219>197>197lbs   LVAD interrogation reveals:  Speed: 5800 Flow: 5.4 Power:5w PI:2.7 Alarms:none Events:>36 PI eventsin last 24 hours Hematocrit: 29 Fixed speed: 5800 Low speed limit: 5500  Drive Line: Weekly. Next change due 11/24/2017.Dressing CDI with anchor appropriately applied on assessment this morning.  Labs:  LDH trend: 139>150>153>127>127>164  INR trend: 3.14>2.5>1.71>1.60>1.64>1.36  Anticoagulation Plan: -INR Goal: 2.0 - 2.5 -ASA Dose: no ASA since hospitalization 04/29/17 for GI bleed; hx of gastritis  Gtts: Dobutamine 7.5 mcg/kg/min Heparin gtt per pharmacy started 11/15/2017  Device: Medtronic BiV - DDD (lower rage 60)  -Therapies: off, end of service 08/2016   Adverse Events on VAD: Significant Events on VAD Support:  - 2/27 - 12/06/16>hospitalized for BiV HF; discharged home on Mil 0.125 - 4/20 - 01/28/17>hospitalized for HF; tunneled PICC placed R subclavian; transitioned to Dob 5 (poor Co-ox on mil) - 6/1 - 03/08/17>hospitalized for R>L CHF; Dobutamine 5 - 7/9 - 04/16/17> hospitalized for altered mental status and HF. Psych eval; Dobutamine increased to 6 - 7/27 - 05/09/17>Hospitalization with melena and recurrent HF. EGD shoed ulcers in stomach. Dobutamine increasedfrom 6 to 7.5 - 8/28 - 06/03/17>hospitalization with melena and planned IP capsule- negative - 11/09/17> hospitalized for HF; IV diuretics    Plan/Recommendations:  1. Please page VAD coordinator for equipment issues or questions.   Balinda Quails RN, VAD Coordinator 24/7 pager [phone]

## 2017-11-17 NOTE — Progress Notes (Signed)
ANTICOAGULATION CONSULT NOTE - Follow Up Consult  Pharmacy Consult for Heparin/Warfarin Indication: LVAD  Allergies  Allergen Reactions  . Chlorhexidine Gluconate Itching and Rash  . Codeine Nausea And Vomiting and Other (See Comments)    In high doses  . Trazodone And Nefazodone Other (See Comments)    Dizziness with subsequent fall   . Lipitor [Atorvastatin] Nausea Only and Other (See Comments)    Nausea with high doses, tolerates 20mg  dose (08/22/16)  . Tape Other (See Comments)    Paper tape is ok   Patient Measurements: Height: 5\' 9"  (175.3 cm) Weight: 197 lb 5 oz (89.5 kg) IBW/kg (Calculated) : 70.7  Heparin Dose Weight: 94.8 kg   Vital Signs: Temp: 97.7 F (36.5 C) (02/14 0328) Temp Source: Oral (02/14 0328) BP: 87/47 (02/14 0328) Pulse Rate: 60 (02/14 0328)  Labs: Recent Labs    11/15/17 0350  11/16/17 0239 11/16/17 1118 11/16/17 1858 11/17/17 0228  HGB 8.5*  --  9.2*  --   --  9.9*  HCT 30.2*  --  31.4*  --   --  33.3*  PLT 201  --  196  --   --  206  LABPROT 18.9*  --  19.3*  --   --  16.6*  INR 1.60  --  1.64  --   --  1.36  HEPARINUNFRC  --    < >  --  <0.10* 0.11* 0.22*  CREATININE 1.46*  --  1.47*  --   --  1.43*   < > = values in this interval not displayed.    Estimated Creatinine Clearance: 60 mL/min (A) (by C-G formula based on SCr of 1.43 mg/dL (H)).  Assessment: 8 yoM admitted for AoCHF exacerbation, s/p HM3 placement 09/2016. On warfarin PTA with lower INR goal due to previous bleeding issues. Last INR in clinic on 1/31 was within goal range. Warfarin doses held x2 (2/6-2/7) d/t supratherapeutic INRs, then became subtherapeutic on 2/11. Heparin drip started 2/12.  PTA warfarin dose: 5mg  daily except 7.5mg  on Mondays and Fridays   INR today is stable 1.64.Confirmed with nursing that level was drawn peripherally and heparin is running through PICC line. Hgb is stable at 9.2, platelets are within normal limits. No signs/symptoms of bleeding.  No interruptions or issues with infusion.   2/14 AM: heparin level sub-therapeutic but trending up  Goal of Therapy:  INR goal 1.8-2.4 Heparin level 0.3-0.7 units/mL Monitor platelets by anticoagulation protocol: Yes   Plan:  Increase heparin infusion to 1850 units/hr  1200 HL Monitor for signs/symptoms of bleeding  Narda Bonds, PharmD, BCPS Clinical Pharmacist Phone: [phone]

## 2017-11-17 NOTE — Progress Notes (Signed)
Patient ID: Ronald Miller., male   DOB: [birth year], 62 y.o.   MRN: [id number]   Advanced Heart Failure VAD Team Note  PCP-Cardiologist: Glori Bickers, MD   Subjective:    Admitted with marked volume overload.   2/7 S/P Paracentesis 5.5 liters removed.   2/12 s/p Paracentesis 5.7 L removed  Weight stable with holding lasix.   Lasix and diamox held yesterday with marked weight loss and lightheadedness.   Feeling tired this morning, but no longer confused.   CVP ~10 MAPs 70s this am.    LVAD INTERROGATION:  HeartMate II LVAD:  Flow 5.4 liters/min, speed 5800, power 5.0, PI 2.7. ~20 PI events.   Objective:    Vital Signs:   Temp:  [97.5 F (36.4 C)-98.3 F (36.8 C)] 98 F (36.7 C) (02/14 0815) Pulse Rate:  [59-89] 60 (02/14 0328) Resp:  [15-25] 17 (02/14 0328) BP: (70-91)/(47-76) 91/60 (02/14 0815) SpO2:  [91 %-94 %] 93 % (02/14 0328) Weight:  [197 lb 5 oz (89.5 kg)] 197 lb 5 oz (89.5 kg) (02/14 0328) Last BM Date: 11/16/17 Mean arterial Pressure 70s this am.   Intake/Output:   Intake/Output Summary (Last 24 hours) at 11/17/2017 0924 Last data filed at 11/17/2017 0815 Gross per 24 hour  Intake 1731.32 ml  Output 1850 ml  Net -118.68 ml     Physical Exam    Physical Exam: CVP ~10 cm  GENERAL: Fatigued. NAD.  HEENT: Normal. NECK: Supple, JVP 8-9 cm. Carotids OK.  CARDIAC:  Mechanical heart sounds with LVAD hum present.  LUNGS:  CTAB, normal effort.  ABDOMEN:  NT, ND, no HSM. No bruits or masses. +BS  LVAD exit site: Well-healed and incorporated. Dressing dry and intact. No erythema or drainage. Stabilization device present and accurately applied. Driveline dressing changed daily per sterile technique. EXTREMITIES:  Warm and dry. No cyanosis, clubbing, rash, or edema.  NEUROLOGIC:  Alert & oriented x 3. Cranial nerves grossly intact. Moves all 4 extremities w/o difficulty. Affect pleasant     Telemetry   V paced, personally reviewed.   EKG   No new  tracings.  Labs   Basic Metabolic Panel: Recent Labs  Lab 11/11/17 0300  11/13/17 1747 11/14/17 0093 11/15/17 0350 11/16/17 0239 11/16/17 1118 11/17/17 0228  NA 132*   < > 132* 133* 131* 130*  --  130*  K 3.6   < > 4.7 4.0 3.1* 3.2*  --  4.0  CL 86*   < > 83* 81* 81* 81*  --  87*  CO2 33*   < > 39* 38* 37* 35*  --  30  GLUCOSE 201*   < > 100* 109* 141* 120*  --  119*  BUN 23*   < > 27* 29* 28* 28*  --  25*  CREATININE 1.46*   < > 1.58* 1.43* 1.46* 1.47*  --  1.43*  CALCIUM 8.2*   < > 8.5* 8.6* 8.4* 8.5*  --  8.6*  MG 2.2  --   --   --   --   --  2.5*  --    < > = values in this interval not displayed.   Liver Function Tests: Recent Labs  Lab 11/15/17 1200  AST 31  ALT 18  ALKPHOS 132*  BILITOT 1.1  PROT 6.4*  ALBUMIN 3.0*   No results for input(s): LIPASE, AMYLASE in the last 168 hours. Recent Labs  Lab 11/15/17 1200  AMMONIA 58*    CBC: Recent Labs  Lab 11/13/17 0527 11/14/17 0635 11/15/17 0350 11/16/17 0239 11/17/17 0228  WBC 6.1 7.4 6.8 6.0 5.8  NEUTROABS  --   --  5.5  --   --   HGB 8.1* 8.3* 8.5* 9.2* 9.9*  HCT 28.8* 29.3* 30.2* 31.4* 33.3*  MCV 75.8* 76.7* 76.8* 76.0* 77.3*  PLT 203 198 201 196 206    INR: Recent Labs  Lab 11/13/17 0527 11/14/17 0635 11/15/17 0350 11/16/17 0239 11/17/17 0228  INR 2.04 1.71 1.60 1.64 1.36    Other results:     Imaging   Ir Paracentesis  Result Date: 11/15/2017 INDICATION: Patient with recurrent ascites. Request is made for therapeutic paracentesis. EXAM: ULTRASOUND GUIDED THERAPEUTIC PARACENTESIS MEDICATIONS: 10 mL 2% lidocaine COMPLICATIONS: None immediate. PROCEDURE: Informed written consent was obtained from the patient after a discussion of the risks, benefits and alternatives to treatment. A timeout was performed prior to the initiation of the procedure. Initial ultrasound scanning demonstrates a large amount of ascites within the right lower abdominal quadrant. The right lower abdomen was  prepped and draped in the usual sterile fashion. 2% lidocaine was used for local anesthesia. Following this, a 19 gauge, 7-cm, Yueh catheter was introduced. An ultrasound image was saved for documentation purposes. The paracentesis was performed. The catheter was removed and a dressing was applied. The patient tolerated the procedure well without immediate post procedural complication. FINDINGS: A total of approximately 5.8 liters of yellow fluid was removed. IMPRESSION: Successful ultrasound-guided paracentesis yielding 5.8 liters of peritoneal fluid. Read by: Brynda Greathouse PA-C Electronically Signed   By: Markus Daft M.D.   On: 11/15/2017 11:44     Medications:     Scheduled Medications: . amiodarone  200 mg Oral Daily  . citalopram  20 mg Oral Daily  . digoxin  0.125 mg Oral Daily  . docusate sodium  200 mg Oral QHS  . gabapentin  300 mg Oral BID  . insulin aspart  0-15 Units Subcutaneous TID WC  . insulin aspart  5 Units Subcutaneous TID WC  . insulin glargine  10 Units Subcutaneous Q2200  . lactulose  20 g Oral BID  . magnesium oxide  400 mg Oral Daily  . midodrine  10 mg Oral TID WC  . multivitamin with minerals  1 tablet Oral Daily  . pantoprazole  40 mg Oral BID AC  . potassium chloride SA  40 mEq Oral BID  . sodium chloride flush  10-40 mL Intracatheter Q12H  . spironolactone  12.5 mg Oral Daily  . sucralfate  1 g Oral TID WC & HS  . Warfarin - Pharmacist Dosing Inpatient   Does not apply q1800    Infusions: . DOBUTamine 7.5 mcg/kg/min (11/17/17 0033)  . heparin 1,850 Units/hr (11/17/17 0815)    PRN Medications: acetaminophen, ondansetron (ZOFRAN) IV, ondansetron, senna-docusate, sodium chloride flush, traMADol, zolpidem   Patient Profile   Ronald Milleris a 62 y.o.male with history of chronic systolic HF s/p Medtronic ICD 2014, CAD s/p CABG in 2000, HTN, Hx of cocaine abuse, Tobacco abuse, Depression, PAF, and COPD. HM3 placed 09/09/2016. In April 2018 he was  placed on milrinone but later switched to dobutamine. He remains on dobutamine 7.72mcg.   Assessment/Plan:    1. 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 Has Medtronic ICD. He was admitted with intractable RV failure despite home dobutamine. He remains markedly volume overloaded with CVP > 20.  He is diuresing reasonably well and had paracentesis. Doppler MAP around 90.  Weak/lightheaded with standing, on midodrine.  LDH stable at 164, LVAD parameters stable.  - Continue dobutamine 7.5 mcg/kg/min. (On chronically) - Holding torsemide with lower CVP. Possibly resume tomorrow but will assess in am.  - Continue midodrine to 10 mg TID.  - Not a good heart transplant candidate. Has reached end stage biventricular failure.  2. CAD:  - Stable. No s/s of ischemia.    3. DMII: Home regimen + sliding scale.  - No change. 4. PAF: ?underlying atrial rhythm - EKG 11/13/17 with profound artifact.  5. Anemia, iron deficiency: Had capsule endoscopy 04/2017 with severe gastritis. Continue carafate. Hgb 8.6>8.3>8>8.4 > 8.3 -> 8.5 -> 9.2 -> 9.9 6.  Anxiety/Depression  - Continue celexa. Stable. 7. H/O RUE DVT: warfarin 8. Chronic Anticoagulation: On coumadin, INR goal 1.8 - 2.4.  - INR 1.36 this am. Continue low dose heparin without bolus. Discussed dose with pharmacist.  9. Cardiac cirrhosis with ascites: Already had paracentesis this admission.  Abdomen remains distended.   - Repeat US +/- paracentesis this am with another 5.7 L out. Stable.  10. Confusion/Hepatic Encephalopathy - Ammonia 58 11/15/17. + asterixis on exam - Continue lactulose 20 mg BID  Volume status much improved. Remains stable/dry. Continue to hold torsemide for now.   I reviewed the LVAD parameters from today, and compared the results to the patient's prior recorded data.  No programming changes were made.  The LVAD is functioning within specified parameters.  The patient performs LVAD self-test daily.  LVAD  interrogation was negative for any significant power changes, alarms or PI events/speed drops.  LVAD equipment check completed and is in good working order.  Back-up equipment present.   LVAD education done on emergency procedures and precautions and reviewed exit site care.   Length of Stay: [address]  Annamaria Helling 11/17/2017, 9:24 AM  VAD Team --- VAD ISSUES ONLY--- Pager [phone] (7am - 7am)  Advanced Heart Failure Team  Pager [phone] (M-F; 7a - 4p)  Please contact [facility] for night-coverage after hours (4p -7a ) and weekends on [email or web address]  Patient seen and examined with the above-signed Advanced Practice Provider and/or Housestaff. I personally reviewed laboratory data, imaging studies and relevant notes. I independently examined the patient and formulated the important aspects of the plan. I have edited the note to reflect any of my changes or salient points. I have personally discussed the plan with the patient and/or family.  Volume status stable. Weight is way below previous baseline. Now holding diuretics. Remains on dobutamine for RV support and midodrine for BP support. Co-ox ok. Still with multiple PI events. VAD speed decreased earlier this week. Continue lactulose for hepatic encephalopathy.   INR 1.36. Remains on heparin. Coumadin being adjusted Discussed dosing with PharmD personally.  Glori Bickers, MD  9:38 AM

## 2017-11-18 LAB — CBC
HCT: 32.1 % — ABNORMAL LOW (ref 39.0–52.0)
Hemoglobin: 9.2 g/dL — ABNORMAL LOW (ref 13.0–17.0)
MCH: 22.7 pg — AB (ref 26.0–34.0)
MCHC: 28.7 g/dL — AB (ref 30.0–36.0)
MCV: 79.1 fL (ref 78.0–100.0)
PLATELETS: 188 10*3/uL (ref 150–400)
RBC: 4.06 MIL/uL — ABNORMAL LOW (ref 4.22–5.81)
RDW: 26.6 % — ABNORMAL HIGH (ref 11.5–15.5)
WBC: 5.2 10*3/uL (ref 4.0–10.5)

## 2017-11-18 LAB — GLUCOSE, CAPILLARY
GLUCOSE-CAPILLARY: 129 mg/dL — AB (ref 65–99)
Glucose-Capillary: 137 mg/dL — ABNORMAL HIGH (ref 65–99)
Glucose-Capillary: 102 mg/dL — ABNORMAL HIGH (ref 65–99)
Glucose-Capillary: 142 mg/dL — ABNORMAL HIGH (ref 65–99)

## 2017-11-18 LAB — BASIC METABOLIC PANEL
Anion gap: 11 (ref 5–15)
BUN: 21 mg/dL — ABNORMAL HIGH (ref 6–20)
CALCIUM: 8.7 mg/dL — AB (ref 8.9–10.3)
CO2: 29 mmol/L (ref 22–32)
CREATININE: 1.19 mg/dL (ref 0.61–1.24)
Chloride: 92 mmol/L — ABNORMAL LOW (ref 101–111)
GFR calc Af Amer: >60 mL/min (ref >60)
GFR calc non Af Amer: >60 mL/min (ref >60)
GLUCOSE: 146 mg/dL — AB (ref 65–99)
Potassium: 3.9 mmol/L (ref 3.5–5.1)
Sodium: 132 mmol/L — ABNORMAL LOW (ref 135–145)

## 2017-11-18 LAB — LACTATE DEHYDROGENASE: LDH: 130 U/L (ref 98–192)

## 2017-11-18 LAB — PROTIME-INR
INR: 1.39
PROTHROMBIN TIME: 17 s — AB (ref 11.4–15.2)

## 2017-11-18 LAB — AMMONIA: Ammonia: 56 umol/L — ABNORMAL HIGH (ref 9–35)

## 2017-11-18 LAB — HEPARIN LEVEL (UNFRACTIONATED): HEPARIN UNFRACTIONATED: 0.59 [IU]/mL (ref 0.30–0.70)

## 2017-11-18 IMAGING — US US PARACENTESIS
1 series · 5 of 5 positions shown · non-contrast
Comparison: none

INDICATION: CHF,CAD, recurrent ascites. Request made for therapeutic
paracentesis.

[Series 1: us paracentesis · 0.30mm/px · 5 of 5 slices shown]
[im 1/5]
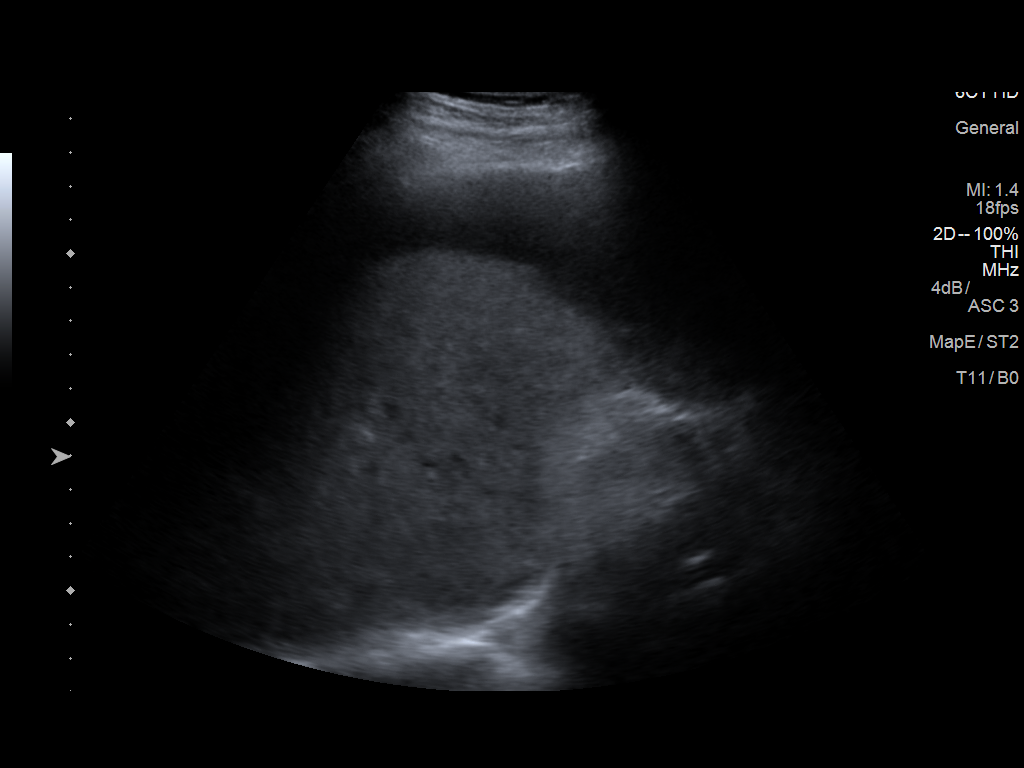
[im 2/5]
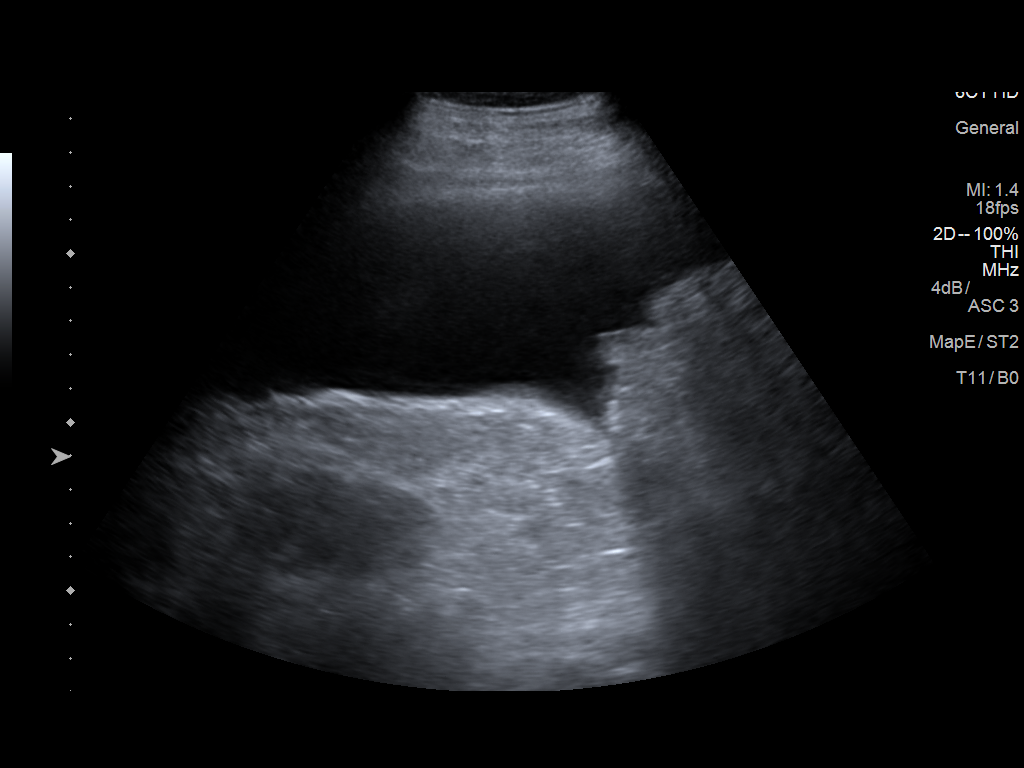
[im 3/5]
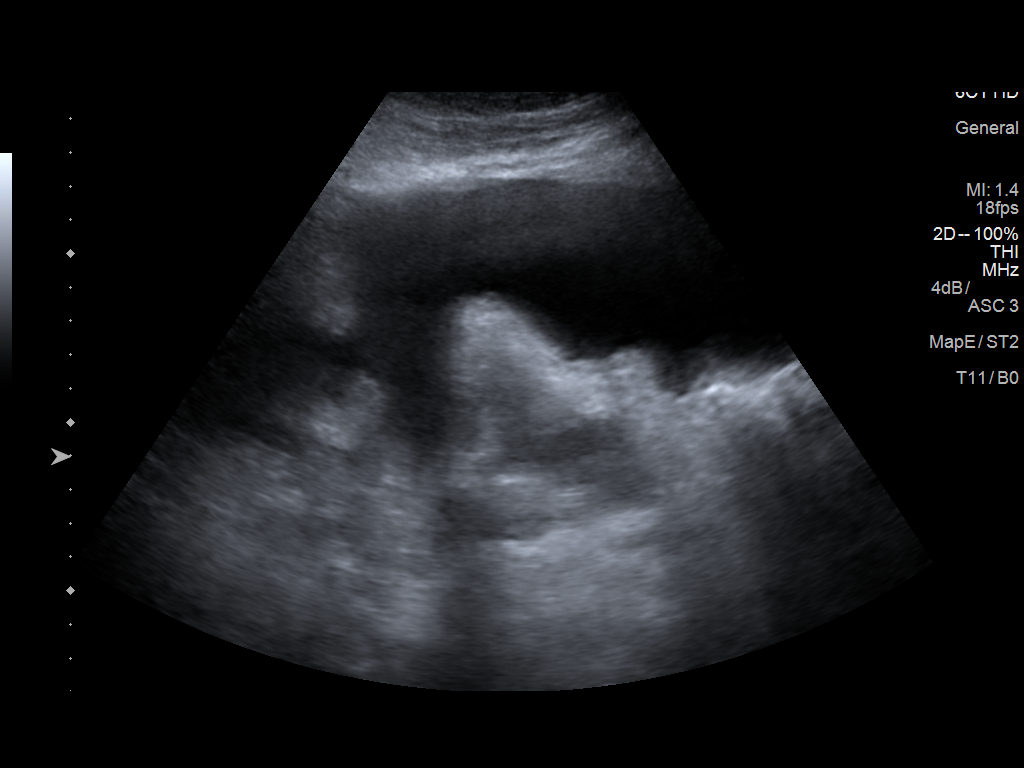
[im 4/5]
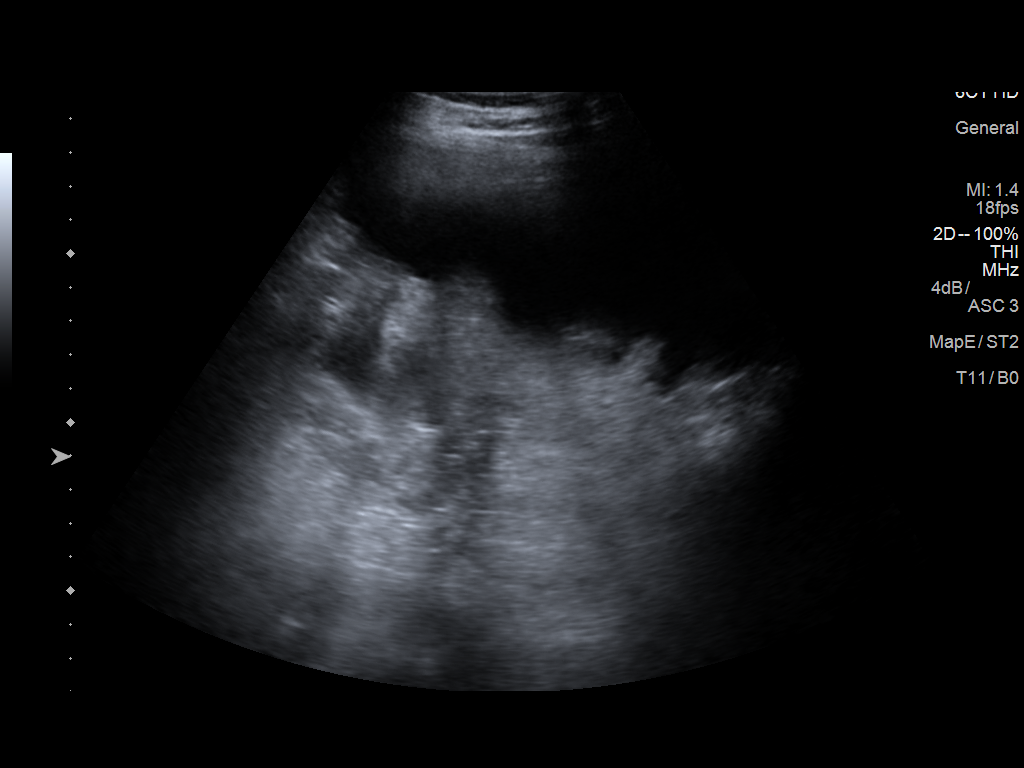
[im 5/5]
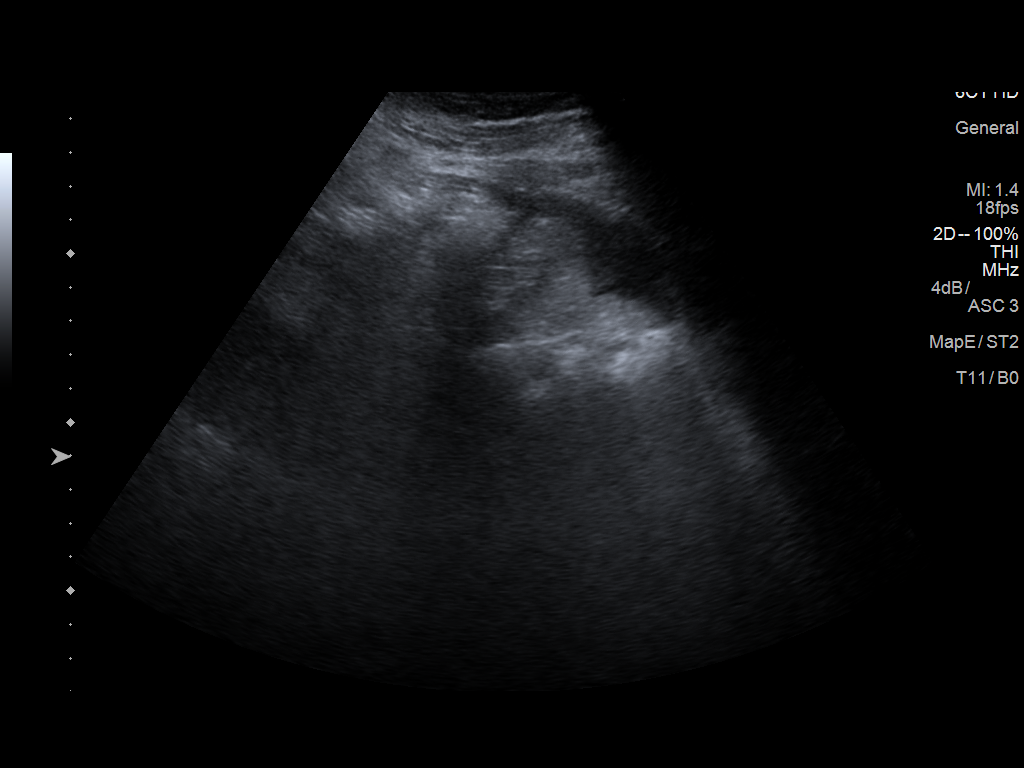

[5 of 5 positions shown; findings below may reference images not displayed]

EXAM:
ULTRASOUND GUIDED THERAPEUTIC PARACENTESIS

MEDICATIONS:
None.

COMPLICATIONS:
None immediate.

PROCEDURE:
Informed written consent was obtained from the patient after a
discussion of the risks, benefits and alternatives to treatment. A
timeout was performed prior to the initiation of the procedure.

Initial ultrasound scanning demonstrates a large amount of ascites
within the left lower abdominal quadrant. The left lower abdomen was
prepped and draped in the usual sterile fashion. 1% lidocaine was
used for local anesthesia.

Following this, a Yueh catheter was introduced. An ultrasound image
was saved for documentation purposes. The paracentesis was
performed. The catheter was removed and a dressing was applied. The
patient tolerated the procedure well without immediate post
procedural complication.
FINDINGS: A total of approximately 5.2 liters of yellow fluid was removed.
IMPRESSION: Successful ultrasound-guided therapeutic paracentesis yielding
liters of peritoneal fluid.

## 2017-11-18 MED ORDER — FUROSEMIDE 10 MG/ML IJ SOLN
80.0000 mg | Freq: Once | INTRAMUSCULAR | Status: AC
  Administered 2017-11-18: 80 mg via INTRAVENOUS
  Filled 2017-11-18: qty 8

## 2017-11-18 MED ORDER — SPIRONOLACTONE 12.5 MG HALF TABLET
12.5000 mg | ORAL_TABLET | Freq: Every day | ORAL | Status: DC
  Administered 2017-11-18 – 2017-11-21 (×4): 12.5 mg via ORAL
  Filled 2017-11-18 (×4): qty 1

## 2017-11-18 MED ORDER — TORSEMIDE 20 MG PO TABS
80.0000 mg | ORAL_TABLET | Freq: Two times a day (BID) | ORAL | Status: DC
  Administered 2017-11-18 – 2017-11-20 (×4): 80 mg via ORAL
  Filled 2017-11-18 (×4): qty 4

## 2017-11-18 MED ORDER — WARFARIN SODIUM 10 MG PO TABS
10.0000 mg | ORAL_TABLET | Freq: Once | ORAL | Status: AC
  Administered 2017-11-18: 10 mg via ORAL
  Filled 2017-11-18: qty 1

## 2017-11-18 MED ORDER — LACTULOSE 10 GM/15ML PO SOLN
30.0000 g | Freq: Two times a day (BID) | ORAL | Status: DC
  Administered 2017-11-18 – 2017-11-21 (×6): 30 g via ORAL
  Filled 2017-11-18 (×6): qty 45

## 2017-11-18 MED ORDER — MIDODRINE HCL 5 MG PO TABS
5.0000 mg | ORAL_TABLET | Freq: Three times a day (TID) | ORAL | Status: DC
  Administered 2017-11-18 – 2017-11-20 (×8): 5 mg via ORAL
  Filled 2017-11-18 (×8): qty 1

## 2017-11-18 MED ORDER — DOCUSATE SODIUM 100 MG PO CAPS
200.0000 mg | ORAL_CAPSULE | Freq: Two times a day (BID) | ORAL | Status: DC
  Administered 2017-11-18 – 2017-11-21 (×7): 200 mg via ORAL
  Filled 2017-11-18 (×7): qty 2

## 2017-11-18 NOTE — Progress Notes (Signed)
ANTICOAGULATION CONSULT NOTE - Follow Up Consult  Pharmacy Consult for Heparin/Warfarin Indication: LVAD  Allergies  Allergen Reactions  . Chlorhexidine Gluconate Itching and Rash  . Codeine Nausea And Vomiting and Other (See Comments)    In high doses  . Trazodone And Nefazodone Other (See Comments)    Dizziness with subsequent fall   . Lipitor [Atorvastatin] Nausea Only and Other (See Comments)    Nausea with high doses, tolerates 20mg  dose (08/22/16)  . Tape Other (See Comments)    Paper tape is ok   Patient Measurements: Height: 5\' 9"  (175.3 cm) Weight: 196 lb 3.2 oz (89 kg) IBW/kg (Calculated) : 70.7  Heparin Dose Weight: 94.8 kg   Vital Signs: Temp: 98 F (36.7 C) (02/15 1213) Temp Source: Oral (02/15 1213) BP: 96/70 (02/15 1213) Pulse Rate: 62 (02/15 1213)  Labs: Recent Labs    11/16/17 0239  11/17/17 0228 11/17/17 1149 11/18/17 0547  HGB 9.2*  --  9.9*  --  9.2*  HCT 31.4*  --  33.3*  --  32.1*  PLT 196  --  206  --  188  LABPROT 19.3*  --  16.6*  --  17.0*  INR 1.64  --  1.36  --  1.39  HEPARINUNFRC  --    < > 0.22* 0.43 0.59  CREATININE 1.47*  --  1.43*  --  1.19   < > = values in this interval not displayed.    Estimated Creatinine Clearance: 71.9 mL/min (by C-G formula based on SCr of 1.19 mg/dL).  Assessment: 41 yoM admitted for AoCHF exacerbation, s/p HM3 placement 09/2016. On warfarin PTA with lower INR goal due to previous bleeding issues. Last INR in clinic on 1/31 was within goal range. Warfarin doses held x2 (2/6-2/7) d/t supratherapeutic INRs, then became subtherapeutic on 2/11. Heparin drip started 2/12.  PTA warfarin dose: 5mg  daily except 7.5mg  on Mondays and Fridays   INR today down to 1.39. Repeat heparin level today is therapeutic on 1850 units/hr. Hgb is stable at 9.2, platelets are within normal limits. No signs/symptoms of bleeding. No interruptions or issues with infusion.   Goal of Therapy:  INR goal 1.8-2.4 Heparin level  0.3-0.7 units/mL Monitor platelets by anticoagulation protocol: Yes   Plan:  Continue heparin infusion at 1850 units/hr Repeat warfarin 10 mg again tonight. Monitor daily INR, Heparin level, and CBC Monitor for signs/symptoms of bleeding  Uvaldo Rising, BCPS  Clinical Pharmacist Pager [phone]  11/18/2017 12:30 PM

## 2017-11-18 NOTE — Progress Notes (Signed)
Patient ID: Ronald Casebolt., male   DOB: [birth year], 62 y.o.   MRN: [id number]   Advanced Heart Failure VAD Team Note  PCP-Cardiologist: Glori Bickers, MD   Subjective:    Admitted with marked volume overload.   2/7 S/P Paracentesis 5.5 liters removed.   2/12 s/p Paracentesis 5.7 L removed  Weight stable with holding torsemide.   More confused, fatigued and weak this am. + bloating   CVP back up to 15-16 MAPs elevated in 90s this am.   LVAD INTERROGATION:  HeartMate II LVAD:  Flow 5.4 liters/min, speed 5800, power 5.0, PI 2.5. 10 PI events.   Objective:    Vital Signs:   Temp:  [97.7 F (36.5 C)-98.5 F (36.9 C)] 98.5 F (36.9 C) (02/15 0754) Pulse Rate:  [60-73] 71 (02/15 0400) Resp:  [13-21] 21 (02/15 0754) BP: (76-143)/(61-109) 143/76 (02/15 0754) SpO2:  [92 %-93 %] 92 % (02/15 0400) Weight:  [196 lb 3.2 oz (89 kg)] 196 lb 3.2 oz (89 kg) (02/15 0500) Last BM Date: 11/17/17 Mean arterial Pressure 98 this am.   Intake/Output:   Intake/Output Summary (Last 24 hours) at 11/18/2017 0841 Last data filed at 11/18/2017 0555 Gross per 24 hour  Intake 554.43 ml  Output 2475 ml  Net -1920.57 ml     Physical Exam    Physical Exam: CVP 15-16  GENERAL: Fatigued. NAD.  HEENT: Normal. NECK: Supple, JVP to jaw. Carotids OK.  CARDIAC:  Mechanical heart sounds with LVAD hum present.  LUNGS:  CTAB, normal effort.  ABDOMEN:  NT, +distended, no HSM. No bruits or masses. +BS  LVAD exit site: Well-healed and incorporated. Dressing dry and intact. No erythema or drainage. Stabilization device present and accurately applied. Driveline dressing changed daily per sterile technique. EXTREMITIES:  Warm and dry. No cyanosis, clubbing, rash, trace edema.  NEUROLOGIC:  Alert . Confused at times . Cranial nerves grossly intact. Moves all 4 extremities w/o difficulty. Affect pleasant     Telemetry   V paced, personally reviewed.   EKG   No new tracings.    Labs   Basic  Metabolic Panel: Recent Labs  Lab 11/14/17 0635 11/15/17 0350 11/16/17 0239 11/16/17 1118 11/17/17 0228 11/18/17 0547  NA 133* 131* 130*  --  130* 132*  K 4.0 3.1* 3.2*  --  4.0 3.9  CL 81* 81* 81*  --  87* 92*  CO2 38* 37* 35*  --  30 29  GLUCOSE 109* 141* 120*  --  119* 146*  BUN 29* 28* 28*  --  25* 21*  CREATININE 1.43* 1.46* 1.47*  --  1.43* 1.19  CALCIUM 8.6* 8.4* 8.5*  --  8.6* 8.7*  MG  --   --   --  2.5*  --   --    Liver Function Tests: Recent Labs  Lab 11/15/17 1200  AST 31  ALT 18  ALKPHOS 132*  BILITOT 1.1  PROT 6.4*  ALBUMIN 3.0*   No results for input(s): LIPASE, AMYLASE in the last 168 hours. Recent Labs  Lab 11/15/17 1200  AMMONIA 58*    CBC: Recent Labs  Lab 11/14/17 0635 11/15/17 0350 11/16/17 0239 11/17/17 0228 11/18/17 0547  WBC 7.4 6.8 6.0 5.8 5.2  NEUTROABS  --  5.5  --   --   --   HGB 8.3* 8.5* 9.2* 9.9* 9.2*  HCT 29.3* 30.2* 31.4* 33.3* 32.1*  MCV 76.7* 76.8* 76.0* 77.3* 79.1  PLT 198 201 196 206 188  INR: Recent Labs  Lab 11/14/17 0635 11/15/17 0350 11/16/17 0239 11/17/17 0228 11/18/17 0547  INR 1.71 1.60 1.64 1.36 1.39    Other results:     Imaging   No results found.   Medications:     Scheduled Medications: . amiodarone  200 mg Oral Daily  . citalopram  20 mg Oral Daily  . digoxin  0.125 mg Oral Daily  . docusate sodium  200 mg Oral QHS  . gabapentin  300 mg Oral BID  . insulin aspart  0-15 Units Subcutaneous TID WC  . insulin aspart  5 Units Subcutaneous TID WC  . insulin glargine  10 Units Subcutaneous Q2200  . lactulose  20 g Oral BID  . magnesium oxide  400 mg Oral Daily  . midodrine  10 mg Oral TID WC  . multivitamin with minerals  1 tablet Oral Daily  . pantoprazole  40 mg Oral BID AC  . potassium chloride SA  40 mEq Oral BID  . sodium chloride flush  10-40 mL Intracatheter Q12H  . spironolactone  12.5 mg Oral Daily  . sucralfate  1 g Oral TID WC & HS  . Warfarin - Pharmacist Dosing  Inpatient   Does not apply q1800    Infusions: . DOBUTamine 7.5 mcg/kg/min (11/18/17 0522)  . heparin 1,850 Units/hr (11/18/17 0522)    PRN Medications: acetaminophen, ondansetron (ZOFRAN) IV, ondansetron, senna-docusate, sodium chloride flush, traMADol, zolpidem   Patient Profile   Ronald Milleris a 62 y.o.male with history of chronic systolic HF s/p Medtronic ICD 2014, CAD s/p CABG in 2000, HTN, Hx of cocaine abuse, Tobacco abuse, Depression, PAF, and COPD. HM3 placed 09/09/2016. In April 2018 he was placed on milrinone but later switched to dobutamine. He remains on dobutamine 7.10mcg.   Assessment/Plan:    1. 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 Has Medtronic ICD. He was admitted with intractable RV failure despite home dobutamine. He remains markedly volume overloaded with CVP > 20.  He is diuresing reasonably well and had paracentesis. Doppler MAP around 90.  Weak/lightheaded with standing, on midodrine.  LDH stable at 130, LVAD parameters stable.  - Continue dobutamine 7.5 mcg/kg/min. (On chronically) - Volume status now elevated agin.  - Give 80 mg IV lasix. Plan for torsemide this evening.  - With elevated MAP cut midodrine back to 5 mg TID.  - Not a good heart transplant candidate. Has reached end stage biventricular failure.  2. CAD:  - Stable. No s/s of ischemia.    3. DMII: Home regimen + sliding scale.  - No change.  4. PAF: ?underlying atrial rhythm - EKG 11/13/17 with profound artifact.  5. Anemia, iron deficiency: Had capsule endoscopy 04/2017 with severe gastritis. Continue carafate. Hgb 8.6>8.3>8>8.4 > 8.3 -> 8.5 -> 9.2 -> 9.9 -> 9.2.  6.  Anxiety/Depression  - Continue celexa. Stable 7. H/O RUE DVT: warfarin 8. Chronic Anticoagulation: On coumadin, INR goal 1.8 - 2.4.  - INR 1.39 this am. Continue low dose heparin. Discussed dose with pharmacist.  9. Cardiac cirrhosis with ascites: Already had paracentesis this admission.   Abdomen remains distended.   - Repeat US +/- paracentesis 11/16/17 with another 5.7 L out. Stable.  10. Confusion/Hepatic Encephalopathy - Ammonia 58 11/15/17. + asterixis on exam - Continue lactulose 20 mg BID  Volume status now elevated again.  Resume diuretics with IV lasix this am, likely torsemide tonight.   I reviewed the LVAD parameters from today, and compared the  results to the patient's prior recorded data.  No programming changes were made.  The LVAD is functioning within specified parameters.  The patient performs LVAD self-test daily.  LVAD interrogation was negative for any significant power changes, alarms or PI events/speed drops.  LVAD equipment check completed and is in good working order.  Back-up equipment present.   LVAD education done on emergency procedures and precautions and reviewed exit site care.  Length of Stay: [address]  Annamaria Helling 11/18/2017, 8:41 AM  VAD Team --- VAD ISSUES ONLY--- Pager [phone] (7am - 7am)  Advanced Heart Failure Team  Pager [phone] (M-F; 7a - 4p)  Please contact [facility] for night-coverage after hours (4p -7a ) and weekends on [email or web address]  Patient seen and examined with the above-signed Advanced Practice Provider and/or Housestaff. I personally reviewed laboratory data, imaging studies and relevant notes. I independently examined the patient and formulated the important aspects of the plan. I have edited the note to reflect any of my changes or salient points. I have personally discussed the plan with the patient and/or family.  He is worse today. Volume status up. More confused. MAPs elevated. Will restart lasix. Recheck ammonia. Will likely need to increase lactulose. Can cut midodrine with elevated MAPs. INR remains 1.3. Continue heparin/warfarin. Discussed dosing with PharmD personally. VAD interrogated personally. Parameters stable.  Glori Bickers, MD  11:09 AM

## 2017-11-18 NOTE — Progress Notes (Signed)
LVAD Coordinator Rounding Note:  Admitted 9/7/18due fluid volume overload.   HM III LVAD implanted on 09/09/16 by Dr. Nancy Nordmann Destination Therapycriteria.  Vital signs: Temp:98.5 HR: 71 Doppler Pressure:88 Automatic BP: 143/76 (98) O2 Sat: 92% on RA Wt in lbs: 239>226>230>219>197>197>196 lbs   LVAD interrogation reveals:  Speed: 5800 Flow: 5.5 Power:4.5 w PI:2.6 Alarms:none Events:>36 PI eventsin last 24 hours Hematocrit: 29 Fixed speed: 5800 Low speed limit: 5500  Drive Line: Weekly. Next change due 11/24/2017.Dressing CDI with anchor appropriately applied on assessment this morning.  Labs:  LDH trend: 139>150>153>127>127>164>130  INR trend: 3.14>2.5>1.71>1.60>1.64>1.36>1.39  Anticoagulation Plan: -INR Goal: 2.0 - 2.5 -ASA Dose: no ASA since hospitalization 04/29/17 for GI bleed; hx of gastritis  Gtts: - Dobutamine 7.5 mcg/kg/min -Heparin gtt per pharmacy started 11/15/2017  Device: Medtronic BiV - DDD (lower rage 60)  -Therapies: off, end of service 08/2016   Adverse Events on VAD: Significant Events on VAD Support:  - 2/27 - 12/06/16>hospitalized for BiV HF; discharged home on Mil 0.125 - 4/20 - 01/28/17>hospitalized for HF; tunneled PICC placed R subclavian; transitioned to Dob 5 (poor Co-ox on mil) - 6/1 - 03/08/17>hospitalized for R>L CHF; Dobutamine 5 - 7/9 - 04/16/17> hospitalized for altered mental status and HF. Psych eval; Dobutamine increased to 6 - 7/27 - 05/09/17>Hospitalization with melena and recurrent HF. EGD shoed ulcers in stomach. Dobutamine increasedfrom 6 to 7.5 - 8/28 - 06/03/17>hospitalization with melena and planned IP capsule- negative - 11/09/17> hospitalized for HF; IV diuretics, paracentesis x 2. Ammonia elevated; started lactulose   Plan/Recommendations:  1. Please page VAD coordinator for equipment issues or questions.   Zada Girt RN, VAD Coordinator 24/7 pager [phone]

## 2017-11-19 LAB — BASIC METABOLIC PANEL
Anion gap: 12 (ref 5–15)
BUN: 22 mg/dL — AB (ref 6–20)
CO2: 27 mmol/L (ref 22–32)
CREATININE: 1.16 mg/dL (ref 0.61–1.24)
Calcium: 8.4 mg/dL — ABNORMAL LOW (ref 8.9–10.3)
Chloride: 95 mmol/L — ABNORMAL LOW (ref 101–111)
GFR calc Af Amer: >60 mL/min (ref >60)
GLUCOSE: 165 mg/dL — AB (ref 65–99)
POTASSIUM: 4.1 mmol/L (ref 3.5–5.1)
SODIUM: 134 mmol/L — AB (ref 135–145)

## 2017-11-19 LAB — GLUCOSE, CAPILLARY
GLUCOSE-CAPILLARY: 132 mg/dL — AB (ref 65–99)
GLUCOSE-CAPILLARY: 138 mg/dL — AB (ref 65–99)
GLUCOSE-CAPILLARY: 122 mg/dL — AB (ref 65–99)
GLUCOSE-CAPILLARY: 174 mg/dL — AB (ref 65–99)

## 2017-11-19 LAB — CBC
HEMATOCRIT: 32.3 % — AB (ref 39.0–52.0)
Hemoglobin: 9.4 g/dL — ABNORMAL LOW (ref 13.0–17.0)
MCH: 23.1 pg — ABNORMAL LOW (ref 26.0–34.0)
MCHC: 29.1 g/dL — AB (ref 30.0–36.0)
MCV: 79.4 fL (ref 78.0–100.0)
PLATELETS: 167 10*3/uL (ref 150–400)
RBC: 4.07 MIL/uL — ABNORMAL LOW (ref 4.22–5.81)
RDW: 27.5 % — AB (ref 11.5–15.5)
WBC: 5.5 10*3/uL (ref 4.0–10.5)

## 2017-11-19 LAB — PROTIME-INR
INR: 1.66
PROTHROMBIN TIME: 19.4 s — AB (ref 11.4–15.2)

## 2017-11-19 LAB — DIGOXIN LEVEL: DIGOXIN LVL: 0.9 ng/mL (ref 0.8–2.0)

## 2017-11-19 LAB — LACTATE DEHYDROGENASE: LDH: 139 U/L (ref 98–192)

## 2017-11-19 LAB — HEPARIN LEVEL (UNFRACTIONATED): HEPARIN UNFRACTIONATED: 0.38 [IU]/mL (ref 0.30–0.70)

## 2017-11-19 IMAGING — US US ABDOMEN LIMITED
1 series · 14 of 24 positions shown · non-contrast
Comparison: 10/29/2015

CLINICAL DATA: Fatty liver, status post cholecystectomy

EXAM:
US ABDOMEN LIMITED - RIGHT UPPER QUADRANT

[Series 1: us abdomen limited · 0.25mm/px · 14 of 24 slices shown]
[im 1/24]
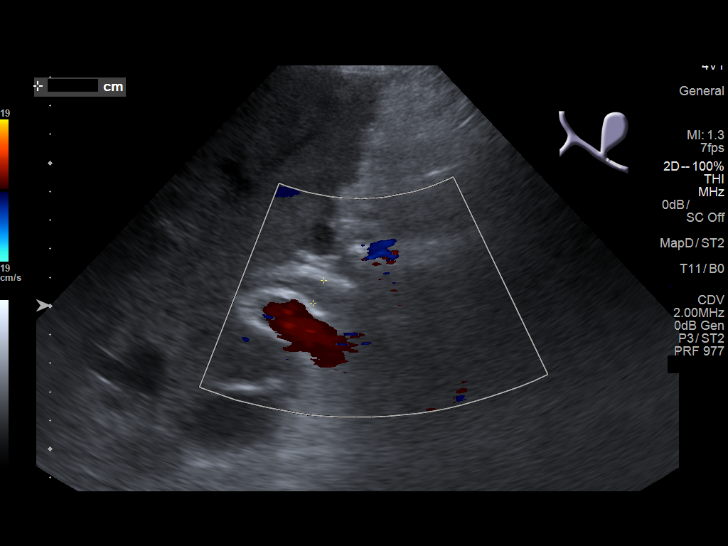
[im 3/24]
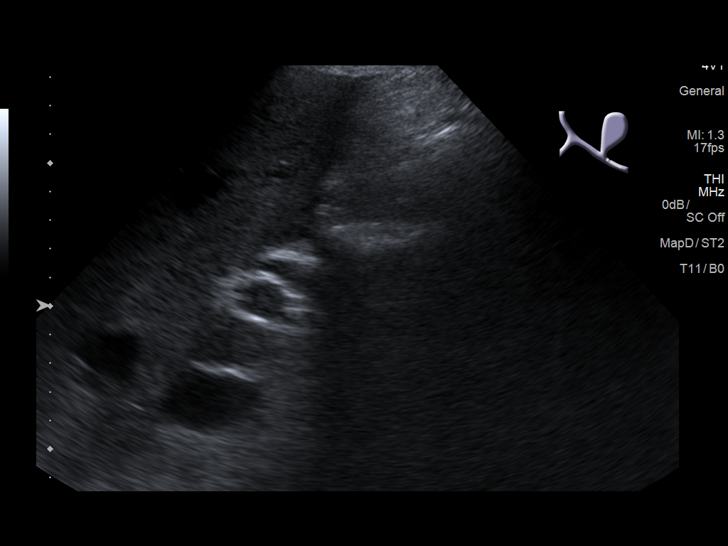
[im 5/24]
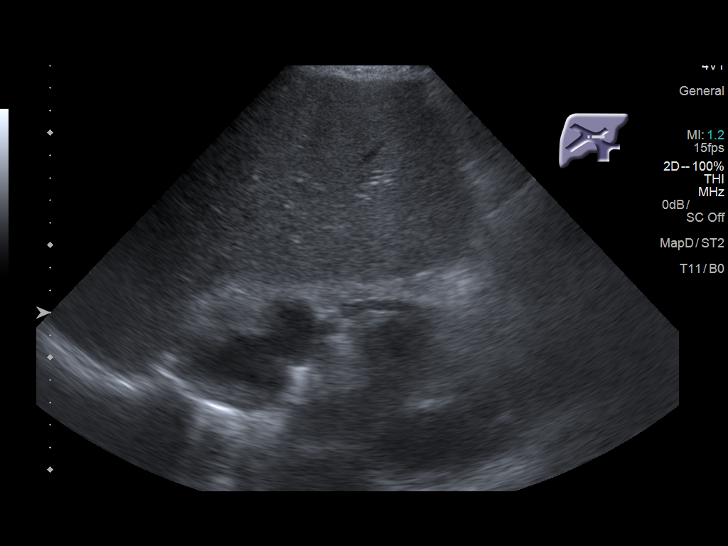
[im 7/24]
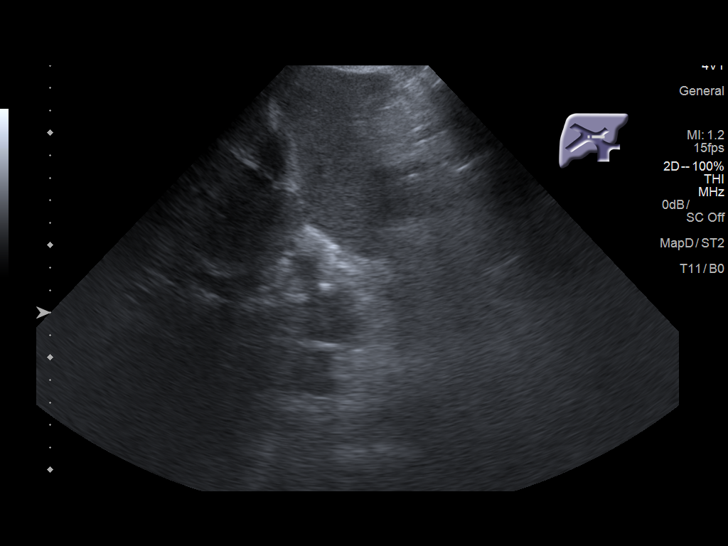
[im 8/24]
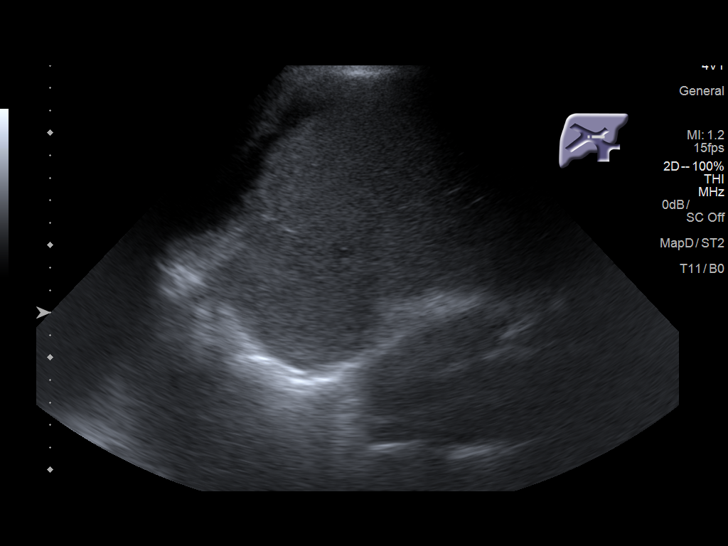
[im 10/24]
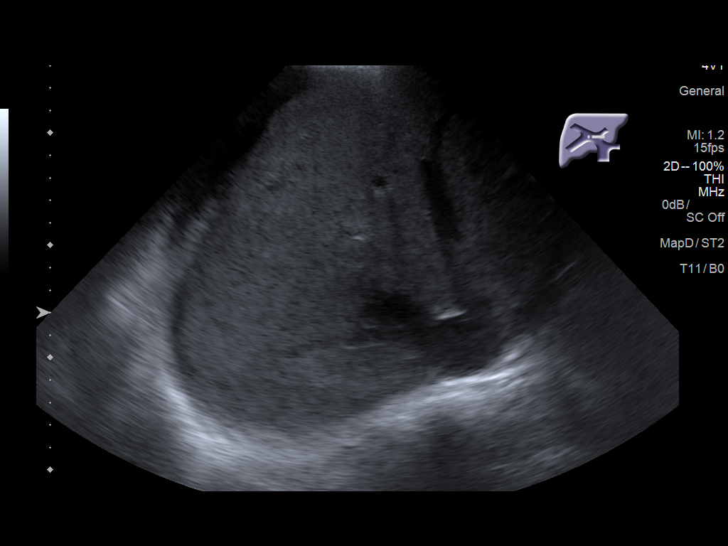
[im 12/24]
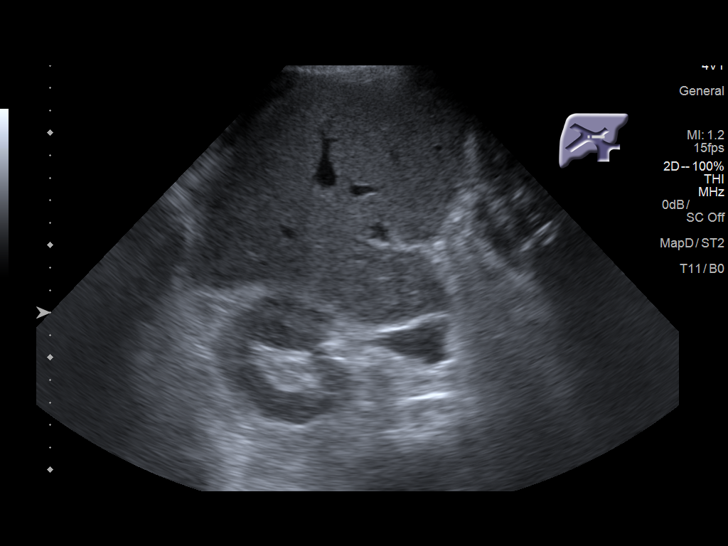
[im 13/24]
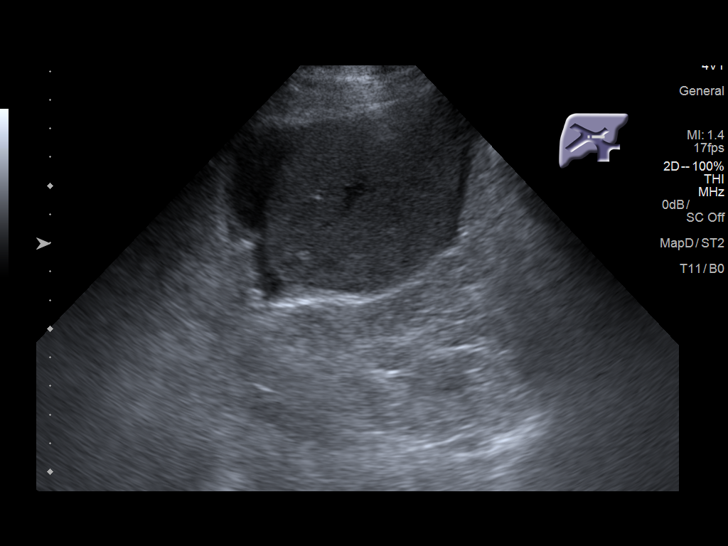
[im 15/24]
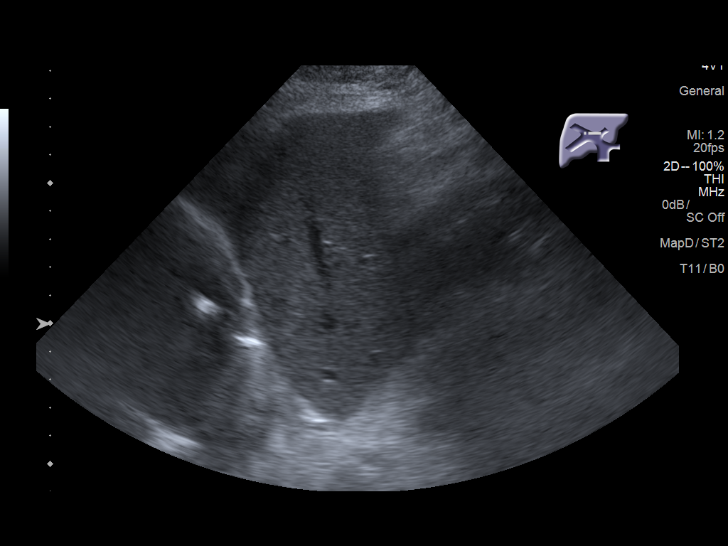
[im 17/24]
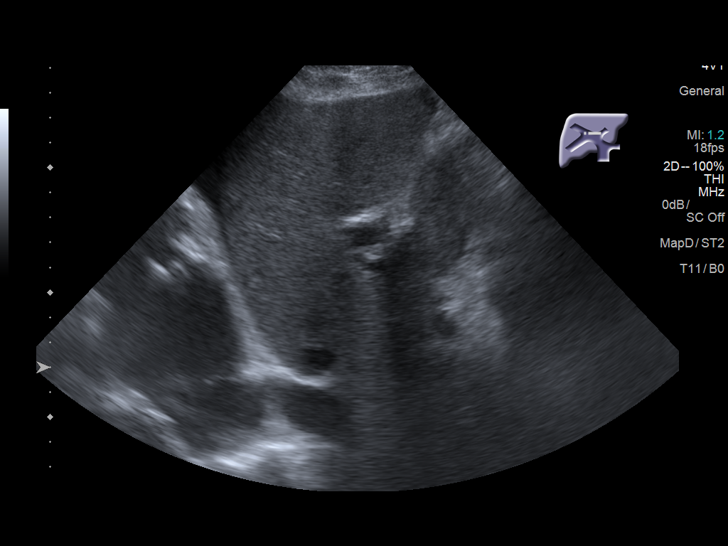
[im 19/24]
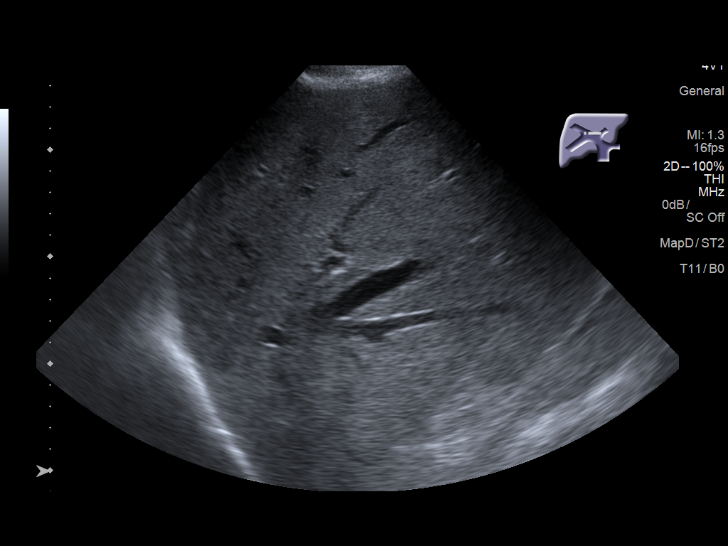
[im 20/24]
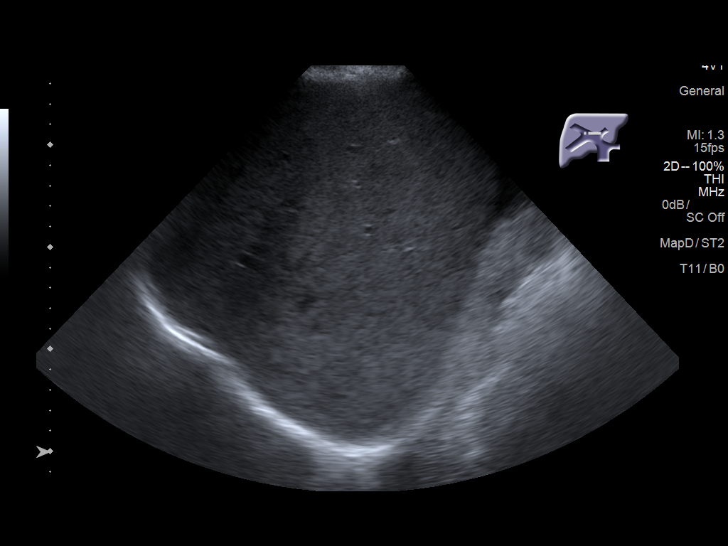
[im 22/24]
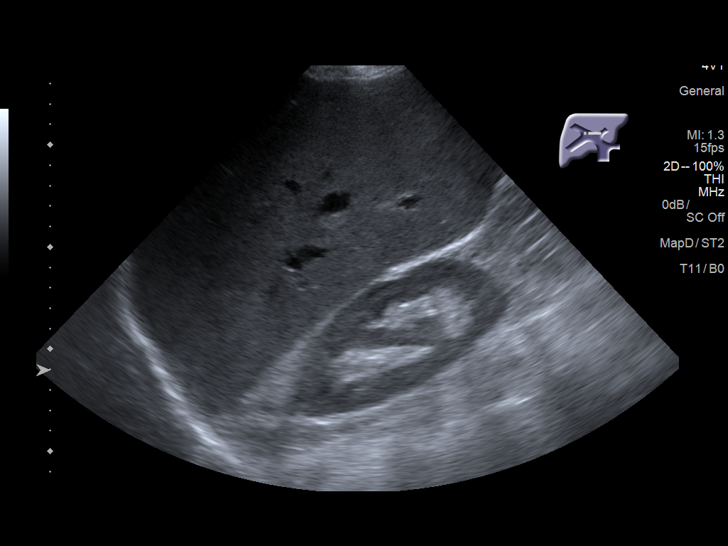
[im 24/24]
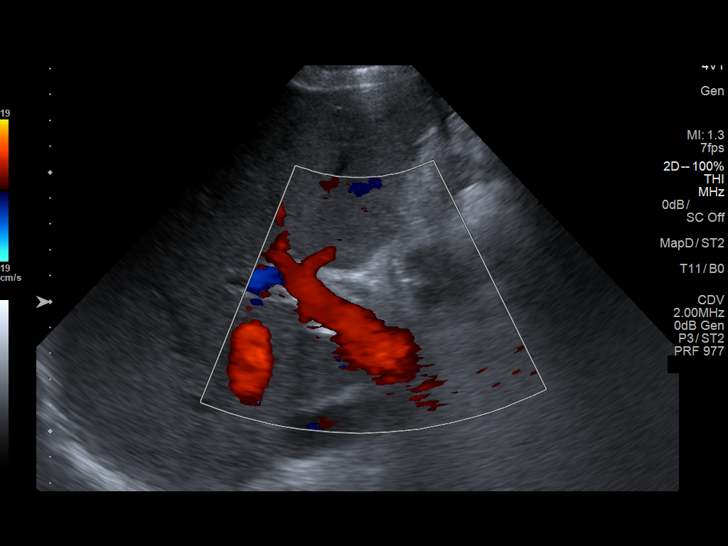

[14 of 24 positions shown; findings below may reference images not displayed]

FINDINGS: Gallbladder:

Surgically absent

Common bile duct:

Diameter: 9 mm

Liver:

Coarse hepatic echotexture with hyperechoic hepatic parenchyma. No
focal hepatic lesion is seen.

Additional comments: Mild perihepatic ascites.
IMPRESSION: Coarse hepatic echotexture with hyperechoic hepatic parenchyma,
suggesting hepatocellular disease such as cirrhosis or hepatic
steatosis. No focal hepatic lesion is seen.

Mild perihepatic ascites.

Status post cholecystectomy.

## 2017-11-19 MED ORDER — METOLAZONE 2.5 MG PO TABS
2.5000 mg | ORAL_TABLET | Freq: Once | ORAL | Status: AC
  Administered 2017-11-19: 2.5 mg via ORAL
  Filled 2017-11-19: qty 1

## 2017-11-19 MED ORDER — WARFARIN SODIUM 2.5 MG PO TABS
12.5000 mg | ORAL_TABLET | Freq: Once | ORAL | Status: AC
  Administered 2017-11-19: 12.5 mg via ORAL
  Filled 2017-11-19: qty 1

## 2017-11-19 MED ORDER — FUROSEMIDE 10 MG/ML IJ SOLN
80.0000 mg | Freq: Once | INTRAMUSCULAR | Status: AC
  Administered 2017-11-19: 80 mg via INTRAVENOUS
  Filled 2017-11-19: qty 8

## 2017-11-19 NOTE — Progress Notes (Signed)
ANTICOAGULATION CONSULT NOTE - Follow Up Consult  Pharmacy Consult for Heparin/Warfarin Indication: LVAD  Allergies  Allergen Reactions  . Chlorhexidine Gluconate Itching and Rash  . Codeine Nausea And Vomiting and Other (See Comments)    In high doses  . Trazodone And Nefazodone Other (See Comments)    Dizziness with subsequent fall   . Lipitor [Atorvastatin] Nausea Only and Other (See Comments)    Nausea with high doses, tolerates 20mg  dose (08/22/16)  . Tape Other (See Comments)    Paper tape is ok   Patient Measurements: Height: 5\' 9"  (175.3 cm) Weight: 198 lb 3.2 oz (89.9 kg) IBW/kg (Calculated) : 70.7  Heparin Dose Weight: 94.8 kg   Vital Signs: Temp: 98 F (36.7 C) (02/16 0356) Temp Source: Oral (02/16 0356) BP: 73/59 (02/16 0400) Pulse Rate: 58 (02/16 0356)  Labs: Recent Labs    11/17/17 0228 11/17/17 1149 11/18/17 0547 11/19/17 0230  HGB 9.9*  --  9.2* 9.4*  HCT 33.3*  --  32.1* 32.3*  PLT 206  --  188 167  LABPROT 16.6*  --  17.0* 19.4*  INR 1.36  --  1.39 1.66  HEPARINUNFRC 0.22* 0.43 0.59 0.38  CREATININE 1.43*  --  1.19 1.16    Estimated Creatinine Clearance: 74.2 mL/min (by C-G formula based on SCr of 1.16 mg/dL).  Assessment: 29 yoM admitted for AoCHF exacerbation, s/p HM3 placement 09/2016. On warfarin PTA with lower INR goal due to previous bleeding issues. Last INR in clinic on 1/31 was within goal range. Warfarin doses held x2 (2/6-2/7) d/t supratherapeutic INRs, then became subtherapeutic on 2/11. Heparin drip started 2/12.  PTA warfarin dose: 5mg  daily except 7.5mg  on Mondays and Fridays   INR today up to 1.6. Heparin level today is therapeutic on 1850 units/hr. Hgb is stable at 9.4, platelets are within normal limits. No signs/symptoms of bleeding. No interruptions or issues with infusion.   Goal of Therapy:  INR goal 1.8-2.4 Heparin level 0.3-0.7 units/mL Monitor platelets by anticoagulation protocol: Yes   Plan:  Continue heparin  infusion at 1850 units/hr Warfarin 12.5 mg tonight. Monitor daily INR, Heparin level, and CBC Monitor for signs/symptoms of bleeding  Erin Hearing PharmD., BCPS Clinical Pharmacist 11/19/2017 8:14 AM

## 2017-11-19 NOTE — Progress Notes (Signed)
Patient ID: Ronald Mayberry., male   DOB: [birth year], 62 y.o.   MRN: [id number]   Advanced Heart Failure VAD Team Note  PCP-Cardiologist: Glori Bickers, MD   Subjective:    Admitted with marked volume overload.   2/7 S/P Paracentesis 5.5 liters removed.   2/12 s/p Paracentesis 5.7 L removed  Received IV lasix yesterday due to worsening volume overload. Also lactulose increased for worsening encephalopathy and ammonia 56.  Feels better today. More alert. Weight up 2 pounds. On his breakfast tray he has 3 sprites and 5 fruit dishes all of which are empty. CVP still 15-16   LVAD INTERROGATION:  HeartMate II LVAD:  Flow 5.3 liters/min, speed 5800, power 5.2, PI 2.9  + PI events.   Objective:    Vital Signs:   Temp:  [97.7 F (36.5 C)-98.4 F (36.9 C)] 97.9 F (36.6 C) (02/16 0756) Pulse Rate:  [57-63] 57 (02/16 0758) Resp:  [15-21] 18 (02/16 0356) BP: (73-97)/(59-85) 95/68 (02/16 0757) SpO2:  [94 %-97 %] 94 % (02/16 0758) Weight:  [89.9 kg (198 lb 3.2 oz)] 89.9 kg (198 lb 3.2 oz) (02/16 0500) Last BM Date: 11/18/17 Mean arterial Pressure 70-80 this am.   Intake/Output:   Intake/Output Summary (Last 24 hours) at 11/19/2017 1104 Last data filed at 11/19/2017 1021 Gross per 24 hour  Intake 2699.75 ml  Output 1950 ml  Net 749.75 ml     Physical Exam    Physical Exam: CVP 15-16   General:  Lying in bed. NAD  HEENT: normal  Neck: supple. JVP to ear  Carotids 2+ bilat; no bruits. No lymphadenopathy or thryomegaly appreciated. Cor: LVAD hum.  Lungs: Clear. Abdomen: obese soft, nontender, +distended. No hepatosplenomegaly. No bruits or masses. Good bowel sounds. Driveline site clean. Anchor in place.  Extremities: no cyanosis, clubbing, rash. Warm no trace edema  Neuro: alert & oriented x 3. No focal deficits. Moves all 4 without problem  Minimal asterixis   Telemetry   V paced 60, personally reviewed.   EKG   No new tracings.    Labs   Basic Metabolic  Panel: Recent Labs  Lab 11/15/17 0350 11/16/17 0239 11/16/17 1118 11/17/17 0228 11/18/17 0547 11/19/17 0230  NA 131* 130*  --  130* 132* 134*  K 3.1* 3.2*  --  4.0 3.9 4.1  CL 81* 81*  --  87* 92* 95*  CO2 37* 35*  --  30 29 27   GLUCOSE 141* 120*  --  119* 146* 165*  BUN 28* 28*  --  25* 21* 22*  CREATININE 1.46* 1.47*  --  1.43* 1.19 1.16  CALCIUM 8.4* 8.5*  --  8.6* 8.7* 8.4*  MG  --   --  2.5*  --   --   --    Liver Function Tests: Recent Labs  Lab 11/15/17 1200  AST 31  ALT 18  ALKPHOS 132*  BILITOT 1.1  PROT 6.4*  ALBUMIN 3.0*   No results for input(s): LIPASE, AMYLASE in the last 168 hours. Recent Labs  Lab 11/15/17 1200 11/18/17 1000  AMMONIA 58* 56*    CBC: Recent Labs  Lab 11/15/17 0350 11/16/17 0239 11/17/17 0228 11/18/17 0547 11/19/17 0230  WBC 6.8 6.0 5.8 5.2 5.5  NEUTROABS 5.5  --   --   --   --   HGB 8.5* 9.2* 9.9* 9.2* 9.4*  HCT 30.2* 31.4* 33.3* 32.1* 32.3*  MCV 76.8* 76.0* 77.3* 79.1 79.4  PLT 201 196 206 188 167  INR: Recent Labs  Lab 11/15/17 0350 11/16/17 0239 11/17/17 0228 11/18/17 0547 11/19/17 0230  INR 1.60 1.64 1.36 1.39 1.66    Other results:     Imaging   No results found.   Medications:     Scheduled Medications: . amiodarone  200 mg Oral Daily  . citalopram  20 mg Oral Daily  . digoxin  0.125 mg Oral Daily  . docusate sodium  200 mg Oral BID  . gabapentin  300 mg Oral BID  . insulin aspart  0-15 Units Subcutaneous TID WC  . insulin aspart  5 Units Subcutaneous TID WC  . insulin glargine  10 Units Subcutaneous Q2200  . lactulose  30 g Oral BID  . magnesium oxide  400 mg Oral Daily  . midodrine  5 mg Oral TID WC  . multivitamin with minerals  1 tablet Oral Daily  . pantoprazole  40 mg Oral BID AC  . potassium chloride SA  40 mEq Oral BID  . sodium chloride flush  10-40 mL Intracatheter Q12H  . spironolactone  12.5 mg Oral Daily  . sucralfate  1 g Oral TID WC & HS  . torsemide  80 mg Oral BID   . warfarin  12.5 mg Oral ONCE-1800  . Warfarin - Pharmacist Dosing Inpatient   Does not apply q1800    Infusions: . DOBUTamine 7.5 mcg/kg/min (11/19/17 0700)  . heparin 1,850 Units/hr (11/19/17 0800)    PRN Medications: acetaminophen, ondansetron (ZOFRAN) IV, ondansetron, senna-docusate, sodium chloride flush, traMADol, zolpidem   Patient Profile   Ronald Milleris a 62 y.o.male with history of chronic systolic HF s/p Medtronic ICD 2014, CAD s/p CABG in 2000, HTN, Hx of cocaine abuse, Tobacco abuse, Depression, PAF, and COPD. HM3 placed 09/09/2016. In April 2018 he was placed on milrinone but later switched to dobutamine. He remains on dobutamine 7.23mcg.   Assessment/Plan:    1. 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 Has Medtronic ICD. He was admitted with intractable RV failure despite home dobutamine. He remains markedly volume overloaded with CVP > 20.  He is diuresing reasonably well and had paracentesis. Doppler MAP around 90.  Weak/lightheaded with standing, on midodrine.  LDH stable at 130, LVAD parameters stable.  - Continue dobutamine 7.5 mcg/kg/min. (On chronically for RV failure) - Volume status remains elevated despite IV lasix yesterday due to high oral intake.  - Repeat IV lasix today with one dose metolazone  - MAP improved. Midodrine cut back yesterday Adjust as needed - Fluid restrict 1500cc. AGAIN, discussed need for fluid restritction - Not a good heart transplant candidate. Has reached end stage biventricular failure.  2. CAD:  - Stable. No s/s of ischemia.    3. DMII: Home regimen + sliding scale.  - No change.  4. PAF: ?underlying atrial rhythm - EKG 11/13/17 with profound artifact.  5. Anemia, iron deficiency: Had capsule endoscopy 04/2017 with severe gastritis. Continue carafate.  - Hgb stable 6.  Anxiety/Depression  - Continue celexa. Stable 7. H/O RUE DVT: warfarin 8. Chronic Anticoagulation: On coumadin, INR goal 1.8 -  2.4.  - INR 1.66 this am. Continue low dose heparin. hgb stable. Discussed dosing with PharmD personally. 9. Cardiac cirrhosis with ascites: Already had paracentesis this admission.  Abdomen remains distended.   - Repeat US +/- paracentesis 11/16/17 with another 5.7 L out. Stable.  10. Confusion/Hepatic Encephalopathy - Ammonia 58 11/15/17. + asterixis on exam - Lactulose increased 2/15   I reviewed the  LVAD parameters from today, and compared the results to the patient's prior recorded data.  No programming changes were made.  The LVAD is functioning within specified parameters.  The patient performs LVAD self-test daily.  LVAD interrogation was negative for any significant power changes, alarms or PI events/speed drops.  LVAD equipment check completed and is in good working order.  Back-up equipment present.   LVAD education done on emergency procedures and precautions and reviewed exit site care.  Length of Stay: [place], MD 11/19/2017, 11:04 AM  VAD Team --- VAD ISSUES ONLY--- Pager [phone] (7am - 7am)  Advanced Heart Failure Team  Pager [phone] (M-F; 7a - 4p)  Please contact [facility] for night-coverage after hours (4p -7a ) and weekends on [email or web address]

## 2017-11-20 LAB — BASIC METABOLIC PANEL
Anion gap: 12 (ref 5–15)
BUN: 21 mg/dL — ABNORMAL HIGH (ref 6–20)
CALCIUM: 8.8 mg/dL — AB (ref 8.9–10.3)
CO2: 30 mmol/L (ref 22–32)
CREATININE: 1.17 mg/dL (ref 0.61–1.24)
Chloride: 87 mmol/L — ABNORMAL LOW (ref 101–111)
GFR calc Af Amer: >60 mL/min (ref >60)
GFR calc non Af Amer: >60 mL/min (ref >60)
Glucose, Bld: 184 mg/dL — ABNORMAL HIGH (ref 65–99)
Potassium: 4.1 mmol/L (ref 3.5–5.1)
Sodium: 129 mmol/L — ABNORMAL LOW (ref 135–145)

## 2017-11-20 LAB — CBC
HCT: 32.7 % — ABNORMAL LOW (ref 39.0–52.0)
Hemoglobin: 9.7 g/dL — ABNORMAL LOW (ref 13.0–17.0)
MCH: 23.5 pg — AB (ref 26.0–34.0)
MCHC: 29.7 g/dL — AB (ref 30.0–36.0)
MCV: 79.2 fL (ref 78.0–100.0)
PLATELETS: 159 10*3/uL (ref 150–400)
RBC: 4.13 MIL/uL — AB (ref 4.22–5.81)
RDW: 28.2 % — ABNORMAL HIGH (ref 11.5–15.5)
WBC: 6.3 10*3/uL (ref 4.0–10.5)

## 2017-11-20 LAB — AMMONIA: Ammonia: 35 umol/L (ref 9–35)

## 2017-11-20 LAB — LACTATE DEHYDROGENASE: LDH: 137 U/L (ref 98–192)

## 2017-11-20 LAB — GLUCOSE, CAPILLARY
GLUCOSE-CAPILLARY: 153 mg/dL — AB (ref 65–99)
Glucose-Capillary: 118 mg/dL — ABNORMAL HIGH (ref 65–99)
Glucose-Capillary: 181 mg/dL — ABNORMAL HIGH (ref 65–99)
Glucose-Capillary: 103 mg/dL — ABNORMAL HIGH (ref 65–99)

## 2017-11-20 LAB — HEPARIN LEVEL (UNFRACTIONATED): HEPARIN UNFRACTIONATED: 0.42 [IU]/mL (ref 0.30–0.70)

## 2017-11-20 LAB — PROTIME-INR
INR: 1.94
PROTHROMBIN TIME: 22 s — AB (ref 11.4–15.2)

## 2017-11-20 MED ORDER — WARFARIN SODIUM 7.5 MG PO TABS
7.5000 mg | ORAL_TABLET | Freq: Once | ORAL | Status: AC
  Administered 2017-11-20: 7.5 mg via ORAL
  Filled 2017-11-20: qty 1

## 2017-11-20 MED ORDER — FUROSEMIDE 10 MG/ML IJ SOLN
80.0000 mg | Freq: Once | INTRAMUSCULAR | Status: AC
  Administered 2017-11-20: 80 mg via INTRAVENOUS
  Filled 2017-11-20: qty 8

## 2017-11-20 NOTE — Progress Notes (Signed)
ANTICOAGULATION CONSULT NOTE - Follow Up Consult  Pharmacy Consult for Warfarin Indication: LVAD  Allergies  Allergen Reactions  . Chlorhexidine Gluconate Itching and Rash  . Codeine Nausea And Vomiting and Other (See Comments)    In high doses  . Trazodone And Nefazodone Other (See Comments)    Dizziness with subsequent fall   . Lipitor [Atorvastatin] Nausea Only and Other (See Comments)    Nausea with high doses, tolerates 20mg  dose (08/22/16)  . Tape Other (See Comments)    Paper tape is ok   Patient Measurements: Height: 5\' 9"  (175.3 cm) Weight: 193 lb 5.5 oz (87.7 kg) IBW/kg (Calculated) : 70.7  Heparin Dose Weight: 94.8 kg   Vital Signs: Temp: 97.8 F (36.6 C) (02/17 0340) Temp Source: Oral (02/17 0340) BP: 78/61 (02/17 0340) Pulse Rate: 33 (02/17 0340)  Labs: Recent Labs    11/18/17 0547 11/19/17 0230 11/20/17 0605  HGB 9.2* 9.4* 9.7*  HCT 32.1* 32.3* 32.7*  PLT 188 167 159  LABPROT 17.0* 19.4* 22.0*  INR 1.39 1.66 1.94  HEPARINUNFRC 0.59 0.38 0.42  CREATININE 1.19 1.16 1.17    Estimated Creatinine Clearance: 72.7 mL/min (by C-G formula based on SCr of 1.17 mg/dL).  Assessment: 58 yoM admitted for AoCHF exacerbation, s/p HM3 placement 09/2016. On warfarin PTA with lower INR goal due to previous bleeding issues. Last INR in clinic on 1/31 was within goal range. Warfarin doses held x2 (2/6-2/7) d/t supratherapeutic INRs, then became subtherapeutic on 2/11. Heparin drip started 2/12.  PTA warfarin dose: 5mg  daily except 7.5mg  on Mondays and Fridays   INR therapeutic today at 1.9 after several higher doses. Heparin level also therapeutic 0.42 but will stop since INR at goal. CBC and LDH remain stable.  Goal of Therapy:  INR goal 1.8-2.4 Monitor platelets by anticoagulation protocol: Yes   Plan:  1) Stop heparin 2) Warfarin 7.5mg  tonight 3) Daily INR, CBC, LDH  Nena Jordan, PharmD, BCPS 11/20/2017 9:18 AM

## 2017-11-20 NOTE — Progress Notes (Signed)
Patient was found with one knee on the floor post getting himself up from [facility]. He had his call bell in hand and was instructed to call once finished with restroom. He was without injury and upset as he thought this might keep him here longer. Advise that we will use chair and bed alarms to help [place] prevent another Fall. Further encouraged and educated about using the call bell to ask for assistance. He verbalized understanding. Will continue to monitor. Dr. Vaughan Browner made aware.

## 2017-11-20 NOTE — Progress Notes (Signed)
Patient ID: Ronald Stegman., male   DOB: 06-22-1956, 62 y.o.   MRN: [id number]   Advanced Heart Failure VAD Team Note  PCP-Cardiologist: Glori Bickers, MD   Subjective:    Admitted with marked volume overload.   2/7 S/P Paracentesis 5.5 liters removed.   2/12 s/p Paracentesis 5.7 L removed  Received IV lasix yesterday for recurrent volume overload in setting of high po fluid intake. Down 5 pounds. Feels much better. Denies SOB or chest pain. Asterixis improved   On heparin/warfarin  INR 1.94  LVAD INTERROGATION:  HeartMate II LVAD:  Flow 5.7 liters/min, speed 5800, power 4.5, PI 2.2  + occasioanl PI events.   Objective:    Vital Signs:   Temp:  [97.8 F (36.6 C)-98 F (36.7 C)] 97.8 F (36.6 C) (02/17 0340) Pulse Rate:  [33-80] 33 (02/17 0340) Resp:  [16-25] 18 (02/17 0340) BP: (78-144)/(61-85) 78/61 (02/17 0340) SpO2:  [92 %-100 %] 100 % (02/17 0340) Weight:  [87.7 kg (193 lb 5.5 oz)] 87.7 kg (193 lb 5.5 oz) (02/17 0500) Last BM Date: 11/18/17 Mean arterial Pressure 70-80s  Intake/Output:   Intake/Output Summary (Last 24 hours) at 11/20/2017 0925 Last data filed at 11/20/2017 0600 Gross per 24 hour  Intake 1418.6 ml  Output 6100 ml  Net -4681.4 ml     Physical Exam    Physical Exam: CVP 13  General:  NAD. Sitting in chair   HEENT: normal  Neck: supple. JVP jaw Carotids 2+ bilat; no bruits. No lymphadenopathy or thryomegaly appreciated. Cor: LVAD hum.  Lungs: Clear. Abdomen: obese soft, nontender, minimally distended. No hepatosplenomegaly. No bruits or masses. Good bowel sounds. Driveline site clean. Anchor in place.  Extremities: no cyanosis, clubbing, rash. Warm  trace edema  Neuro: alert & oriented x 3. No focal deficits. Moves all 4 without problem    Telemetry   V paced 60s, Personally reviewed   EKG   No new tracings.    Labs   Basic Metabolic Panel: Recent Labs  Lab 11/16/17 0239 11/16/17 1118 11/17/17 0228 11/18/17 0547  11/19/17 0230 11/20/17 0605  NA 130*  --  130* 132* 134* 129*  K 3.2*  --  4.0 3.9 4.1 4.1  CL 81*  --  87* 92* 95* 87*  CO2 35*  --  30 29 27 30   GLUCOSE 120*  --  119* 146* 165* 184*  BUN 28*  --  25* 21* 22* 21*  CREATININE 1.47*  --  1.43* 1.19 1.16 1.17  CALCIUM 8.5*  --  8.6* 8.7* 8.4* 8.8*  MG  --  2.5*  --   --   --   --    Liver Function Tests: Recent Labs  Lab 11/15/17 1200  AST 31  ALT 18  ALKPHOS 132*  BILITOT 1.1  PROT 6.4*  ALBUMIN 3.0*   No results for input(s): LIPASE, AMYLASE in the last 168 hours. Recent Labs  Lab 11/15/17 1200 11/18/17 1000 11/20/17 0630  AMMONIA 58* 56* 35    CBC: Recent Labs  Lab 11/15/17 0350 11/16/17 0239 11/17/17 0228 11/18/17 0547 11/19/17 0230 11/20/17 0605  WBC 6.8 6.0 5.8 5.2 5.5 6.3  NEUTROABS 5.5  --   --   --   --   --   HGB 8.5* 9.2* 9.9* 9.2* 9.4* 9.7*  HCT 30.2* 31.4* 33.3* 32.1* 32.3* 32.7*  MCV 76.8* 76.0* 77.3* 79.1 79.4 79.2  PLT 201 196 206 188 167 159    INR: Recent Labs  Lab 11/16/17 0239 11/17/17 0228 11/18/17 0547 11/19/17 0230 11/20/17 0605  INR 1.64 1.36 1.39 1.66 1.94    Other results:     Imaging   No results found.   Medications:     Scheduled Medications: . amiodarone  200 mg Oral Daily  . citalopram  20 mg Oral Daily  . digoxin  0.125 mg Oral Daily  . docusate sodium  200 mg Oral BID  . gabapentin  300 mg Oral BID  . insulin aspart  0-15 Units Subcutaneous TID WC  . insulin aspart  5 Units Subcutaneous TID WC  . insulin glargine  10 Units Subcutaneous Q2200  . lactulose  30 g Oral BID  . magnesium oxide  400 mg Oral Daily  . midodrine  5 mg Oral TID WC  . multivitamin with minerals  1 tablet Oral Daily  . pantoprazole  40 mg Oral BID AC  . potassium chloride SA  40 mEq Oral BID  . sodium chloride flush  10-40 mL Intracatheter Q12H  . spironolactone  12.5 mg Oral Daily  . sucralfate  1 g Oral TID WC & HS  . torsemide  80 mg Oral BID  . warfarin  7.5 mg Oral  ONCE-1800  . Warfarin - Pharmacist Dosing Inpatient   Does not apply q1800    Infusions: . DOBUTamine 7.5 mcg/kg/min (11/19/17 2000)    PRN Medications: acetaminophen, ondansetron (ZOFRAN) IV, ondansetron, senna-docusate, sodium chloride flush, traMADol, zolpidem   Patient Profile   Ronald Milleris a 63 y.o.male with history of chronic systolic HF s/p Medtronic ICD 2014, CAD s/p CABG in 2000, HTN, Hx of cocaine abuse, Tobacco abuse, Depression, PAF, and COPD. HM3 placed 09/09/2016. In April 2018 he was placed on milrinone but later switched to dobutamine. He remains on dobutamine 7.50mcg.   Assessment/Plan:    1. 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 Has Medtronic ICD. He was admitted with intractable RV failure despite home dobutamine. He remains markedly volume overloaded with CVP > 20.  He is diuresing reasonably well and had paracentesis. Doppler MAP around 90.  Weak/lightheaded with standing, on midodrine.  LDH stable at 130, LVAD parameters stable.  - Continue dobutamine 7.5 mcg/kg/min. (On chronically for RV failure) - Volume status improving but still elevated. CVP 13 - Continue IV lasix today.  - MAP stable. Midodrine cut back Thursday Adjust as needed - Continue dobutamine for RV support - Fluid restrict 1500cc. AGAIN, discussed need for fluid restritction - Not a good heart transplant candidate. Has reached end stage biventricular failure.  2. CAD:  - Stable. No s/s of ischemia.    3. DMII: Home regimen + sliding scale.  - No change.  4. PAF: ?underlying atrial rhythm - EKG 11/13/17 with profound artifact.  5. Anemia, iron deficiency: Had capsule endoscopy 04/2017 with severe gastritis. Continue carafate.  - Hgb stable 6.  Anxiety/Depression  - Continue celexa. Stable 7. H/O RUE DVT: warfarin 8. Chronic Anticoagulation: On coumadin, INR goal 1.8 - 2.4.  - INR 1.94 this am. Can stop heparin.  9. Cardiac cirrhosis with ascites: Already  had paracentesis this admission.  Abdomen remains distended.   - Repeat US +/- paracentesis 11/16/17 with another 5.7 L out. Stable.  10. Confusion/Hepatic Encephalopathy - Ammonia 58 11/15/17. + asterixis on exam - Lactulose increased 2/15 - ammonia 35 today  Improving. Remains volume overloaded though. Continue IV lasix. Hepatic encephalopathy improved with increase lactulose. INR 1.9 can stop heparin. Discussed dosing with  PharmD personally.   I reviewed the LVAD parameters from today, and compared the results to the patient's prior recorded data.  No programming changes were made.  The LVAD is functioning within specified parameters.  The patient performs LVAD self-test daily.  LVAD interrogation was negative for any significant power changes, alarms or PI events/speed drops.  LVAD equipment check completed and is in good working order.  Back-up equipment present.   LVAD education done on emergency procedures and precautions and reviewed exit site care.  Length of Stay: 3  Glori Bickers, MD 11/20/2017, 9:25 AM  VAD Team --- VAD ISSUES ONLY--- Pager [phone] (7am - 7am)  Advanced Heart Failure Team  Pager [phone] (M-F; 7a - 4p)  Please contact [facility] for night-coverage after hours (4p -7a ) and weekends on [email or web address]

## 2017-11-21 LAB — CBC
HCT: 32.6 % — ABNORMAL LOW (ref 39.0–52.0)
HEMOGLOBIN: 9.7 g/dL — AB (ref 13.0–17.0)
MCH: 23.7 pg — AB (ref 26.0–34.0)
MCHC: 29.8 g/dL — ABNORMAL LOW (ref 30.0–36.0)
MCV: 79.7 fL (ref 78.0–100.0)
Platelets: 138 10*3/uL — ABNORMAL LOW (ref 150–400)
RBC: 4.09 MIL/uL — AB (ref 4.22–5.81)
RDW: 28.8 % — ABNORMAL HIGH (ref 11.5–15.5)
WBC: 5.7 10*3/uL (ref 4.0–10.5)

## 2017-11-21 LAB — BASIC METABOLIC PANEL
ANION GAP: 12 (ref 5–15)
BUN: 23 mg/dL — ABNORMAL HIGH (ref 6–20)
CHLORIDE: 89 mmol/L — AB (ref 101–111)
CO2: 29 mmol/L (ref 22–32)
Calcium: 8.6 mg/dL — ABNORMAL LOW (ref 8.9–10.3)
Creatinine, Ser: 1.16 mg/dL (ref 0.61–1.24)
GFR calc Af Amer: >60 mL/min (ref >60)
GFR calc non Af Amer: >60 mL/min (ref >60)
Glucose, Bld: 153 mg/dL — ABNORMAL HIGH (ref 65–99)
Potassium: 4.6 mmol/L (ref 3.5–5.1)
Sodium: 130 mmol/L — ABNORMAL LOW (ref 135–145)

## 2017-11-21 LAB — GLUCOSE, CAPILLARY
GLUCOSE-CAPILLARY: 153 mg/dL — AB (ref 65–99)
Glucose-Capillary: 318 mg/dL — ABNORMAL HIGH (ref 65–99)

## 2017-11-21 LAB — LACTATE DEHYDROGENASE: LDH: 142 U/L (ref 98–192)

## 2017-11-21 LAB — PROTIME-INR
INR: 2.13
Prothrombin Time: 23.6 seconds — ABNORMAL HIGH (ref 11.4–15.2)

## 2017-11-21 MED ORDER — MIDODRINE HCL 10 MG PO TABS: 10.0000 mg | ORAL_TABLET | Freq: Three times a day (TID) | ORAL | 6 refills | Status: AC

## 2017-11-21 MED ORDER — DOBUTAMINE IN D5W 4-5 MG/ML-% IV SOLN: 7.5000 ug/kg/min | INTRAVENOUS | 11 refills | Status: AC

## 2017-11-21 MED ORDER — WARFARIN SODIUM 5 MG PO TABS
5.0000 mg | ORAL_TABLET | Freq: Once | ORAL | Status: AC
  Administered 2017-11-21: 5 mg via ORAL
  Filled 2017-11-21: qty 1

## 2017-11-21 MED ORDER — INSULIN GLARGINE 100 UNIT/ML SOLOSTAR PEN: 10.0000 [IU] | PEN_INJECTOR | Freq: Every day | SUBCUTANEOUS | 6 refills | Status: DC

## 2017-11-21 MED ORDER — POTASSIUM CHLORIDE CRYS ER 20 MEQ PO TBCR: 20.0000 meq | EXTENDED_RELEASE_TABLET | Freq: Two times a day (BID) | ORAL | 3 refills | Status: DC

## 2017-11-21 MED ORDER — LACTULOSE 10 GM/15ML PO SOLN: 30.0000 g | Freq: Two times a day (BID) | ORAL | 6 refills | Status: DC

## 2017-11-21 MED ORDER — MIDODRINE HCL 5 MG PO TABS
10.0000 mg | ORAL_TABLET | Freq: Three times a day (TID) | ORAL | Status: DC
  Administered 2017-11-21 (×2): 10 mg via ORAL
  Filled 2017-11-21 (×2): qty 2

## 2017-11-21 MED ORDER — WARFARIN SODIUM 5 MG PO TABS: 5.0000 mg | ORAL_TABLET | Freq: Once | ORAL | Status: DC

## 2017-11-21 MED ORDER — TORSEMIDE 100 MG PO TABS: ORAL_TABLET | ORAL | 6 refills | Status: DC

## 2017-11-21 MED ORDER — MIDODRINE HCL 5 MG PO TABS: 7.5000 mg | ORAL_TABLET | Freq: Three times a day (TID) | ORAL | Status: DC

## 2017-11-21 NOTE — Progress Notes (Signed)
ANTICOAGULATION CONSULT NOTE - Follow Up Consult  Pharmacy Consult for Warfarin Indication: LVAD  Allergies  Allergen Reactions  . Chlorhexidine Gluconate Itching and Rash  . Codeine Nausea And Vomiting and Other (See Comments)    In high doses  . Trazodone And Nefazodone Other (See Comments)    Dizziness with subsequent fall   . Lipitor [Atorvastatin] Nausea Only and Other (See Comments)    Nausea with high doses, tolerates 20mg  dose (08/22/16)  . Tape Other (See Comments)    Paper tape is ok   Patient Measurements: Height: 5\' 9"  (175.3 cm) Weight: 195 lb 5.2 oz (88.6 kg) IBW/kg (Calculated) : 70.7  Heparin Dose Weight: 94.8 kg   Vital Signs: Temp: 97.6 F (36.4 C) (02/18 0027) Temp Source: Oral (02/18 0027) BP: 85/52 (02/18 0757)  Labs: Recent Labs    11/19/17 0230 11/20/17 0605 11/21/17 0500  HGB 9.4* 9.7* 9.7*  HCT 32.3* 32.7* 32.6*  PLT 167 159 138*  LABPROT 19.4* 22.0* 23.6*  INR 1.66 1.94 2.13  HEPARINUNFRC 0.38 0.42  --   CREATININE 1.16 1.17 1.16    Estimated Creatinine Clearance: 73.7 mL/min (by C-G formula based on SCr of 1.16 mg/dL).  Assessment: 62 yoM admitted for AoCHF exacerbation, s/p HM3 placement 09/2016. On warfarin PTA with lower INR goal due to previous bleeding issues. Last INR in clinic on 1/31 was within goal range. Warfarin doses held x2 (2/6-2/7) d/t supratherapeutic INRs, then became subtherapeutic on 2/11. Heparin drip started 2/12.  PTA warfarin dose: 5mg  daily except 7.5mg  on Mondays and Fridays   INR therapeutic today at 2.1 after several higher doses. CBC and LDH remain stable.  Goal of Therapy:  INR goal 1.8-2.4 Monitor platelets by anticoagulation protocol: Yes   Plan:  1) Resume Coumadin at home dose of 5 mg daily except 7.5 mg on Mon and Fri. 2) Daily INR.  Uvaldo Rising, BCPS  Clinical Pharmacist Pager [phone]  11/21/2017 8:02 AM

## 2017-11-21 NOTE — Progress Notes (Signed)
LVAD Coordinator Rounding Note:  Admitted 9/7/18due fluid volume overload.   HM III LVAD implanted on 09/09/16 by Dr. Nancy Nordmann Destination Therapycriteria.  Vital signs: Temp:97.9 HR: 70 Doppler Pressure:62 Automatic BP: 85/52 (64)-Midodrine increased to 10 mg tid O2 Sat: 96% on RA Wt in lbs: 239>226>230>219>197>197>196>195 lbs   LVAD interrogation reveals:  Speed: 5800 Flow: 5.5 Power:4.5 w PI:2.6 Alarms:none Events:4 PI events in the last 24 hours Hematocrit: 33 Fixed speed: 5800 Low speed limit: 5500  Drive Line: Weekly. Next change due 11/24/2017.Dressing CDI with anchor appropriately applied on assessment this morning.  Labs:  LDH trend: 139>150>153>127>127>164>130>142  INR trend: 3.14>2.5>1.71>1.60>1.64>1.36>1.39>2.13  Anticoagulation Plan: -INR Goal: 2.0 - 2.5 -ASA Dose: no ASA since hospitalization 04/29/17 for GI bleed; hx of gastritis  Gtts: - Dobutamine 7.5 mcg/kg/min -Heparin gtt per pharmacy started 11/15/2017 off  Device: Medtronic BiV - DDD (lower rage 60)  -Therapies: off, end of service 08/2016   Adverse Events on VAD: Significant Events on VAD Support:  - 2/27 - 12/06/16>hospitalized for BiV HF; discharged home on Mil 0.125 - 4/20 - 01/28/17>hospitalized for HF; tunneled PICC placed R subclavian; transitioned to Dob 5 (poor Co-ox on mil) - 6/1 - 03/08/17>hospitalized for R>L CHF; Dobutamine 5 - 7/9 - 04/16/17> hospitalized for altered mental status and HF. Psych eval; Dobutamine increased to 6 - 7/27 - 05/09/17>Hospitalization with melena and recurrent HF. EGD shoed ulcers in stomach. Dobutamine increasedfrom 6 to 7.5 - 8/28 - 06/03/17>hospitalization with melena and planned IP capsule- negative - 11/09/17> hospitalized for HF; IV diuretics, paracentesis x 2. Ammonia elevated; started lactulose   Plan/Recommendations:  1. Pt will be discharged home today. Follow up appt Thursday at 2 pm with Dr. Haroldine Laws and [place]  coordinator.  2. Please page VAD coordinator with equipment questions and concerns.   Tanda Rockers RN, VAD Coordinator 24/7 pager [phone]

## 2017-11-21 NOTE — Progress Notes (Signed)
Took Doppler due to machine registering 50 MAP. Doppler 80. Pt with PVCs. Pt adamantly refused ambulation until after lunch. Encouraged him to keep up with his low sodium diet. Pt sts he doesn't eat anything that has more than 6% sodium in it. [place], ACSM 11:57 AM 11/21/2017

## 2017-11-21 NOTE — Progress Notes (Signed)
Pt discharging to home, all discharge instructions provided to patient with no questions or concerns at this time. Pt has follow up apt at HF clinic on this Thursday and verbalized  Understanding. Pt placed himself on battery and ambulated to the bathroom.

## 2017-11-21 NOTE — Care Management Note (Signed)
Case Management Note  Patient Details  Name: Ronald Miller. MRN: [id number] Date of Birth: 02-Jan-1956  Subjective/Objective:       Pt admitted with HF - LVAD implantation in 2017              Action/Plan:   PTA independent from home on dobutamine supplied by [facility]   Expected Discharge Date:  11/21/17               Expected Discharge Plan:  Home/Self Care(independent from home)  In-House Referral:     Discharge planning Services  CM Consult  Post Acute Care Choice:    Choice offered to:     DME Arranged:    DME Agency:     HH Arranged:    [place] Agency:     Status of Service:  In process, will continue to follow  If discussed at Long Length of Stay Meetings, dates discussed:    Additional Comments: 11/21/2017 Pt to discharge home today with resumption of dobutamine and HHRN provided by [facility] - agency aware of discharge today Maryclare Labrador, RN 11/21/2017, 8:43 AM

## 2017-11-21 NOTE — Progress Notes (Signed)
Patient ID: Ronald Witter., male   DOB: 07/21/1956, 62 y.o.   MRN: [id number]   Advanced Heart Failure VAD Team Note  PCP-Cardiologist: Glori Bickers, MD   Subjective:    Admitted with marked volume overload.   2/7 S/P Paracentesis 5.5 liters removed.   2/12 s/p Paracentesis 5.7 L removed  Yesterday diuresed with IV lasix. Overall weight down 47 pounds.   Wants to go home. Denies SOB/CP.   LVAD INTERROGATION:  HeartMate II LVAD:  Flow 5.5liters/min, speed 5800, power 4.5 PI 2.4    Objective:    Vital Signs:   Temp:  [97.3 F (36.3 C)-98.5 F (36.9 C)] 97.6 F (36.4 C) (02/18 0027) Pulse Rate:  [65-94] 94 (02/17 1906) Resp:  [16-20] 18 (02/18 0700) BP: (79-91)/(59-72) 81/59 (02/18 0027) SpO2:  [95 %] 95 % (02/17 1906) Weight:  [195 lb 5.2 oz (88.6 kg)] 195 lb 5.2 oz (88.6 kg) (02/18 0328) Last BM Date: 11/18/17 Mean arterial Pressure 60-70s  Intake/Output:   Intake/Output Summary (Last 24 hours) at 11/21/2017 0744 Last data filed at 11/21/2017 0328 Gross per 24 hour  Intake 1970.16 ml  Output 3700 ml  Net -1729.84 ml     Physical Exam   CVP 7 personally checked.  Physical Exam: GENERAL: NAD. In bed.  HEENT: normal  NECK: Supple, JVP 7-8  .  2+ bilaterally, no bruits.  No lymphadenopathy or thyromegaly appreciated.   CARDIAC:  Mechanical heart sounds with LVAD hum present.  LUNGS:  Clear to auscultation bilaterally.  ABDOMEN:  Soft, round, nontender, positive bowel sounds x4.     LVAD exit site:   Dressing dry and intact.  No erythema or drainage.  Stabilization device present and accurately applied.  Driveline dressing is being changed daily per sterile technique. EXTREMITIES:  Warm and dry, no cyanosis, clubbing, rash or edema  NEUROLOGIC:  Alert and oriented x 4.  Gait steady.  No aphasia.  No dysarthria.  Affect pleasant.     Telemetry   V paced 70s personally reviewed.    EKG   No new tracings.    Labs   Basic Metabolic Panel: Recent Labs    Lab 11/16/17 1118 11/17/17 0228 11/18/17 0547 11/19/17 0230 11/20/17 0605 11/21/17 0500  NA  --  130* 132* 134* 129* 130*  K  --  4.0 3.9 4.1 4.1 4.6  CL  --  87* 92* 95* 87* 89*  CO2  --  30 29 27 30 29   GLUCOSE  --  119* 146* 165* 184* 153*  BUN  --  25* 21* 22* 21* 23*  CREATININE  --  1.43* 1.19 1.16 1.17 1.16  CALCIUM  --  8.6* 8.7* 8.4* 8.8* 8.6*  MG 2.5*  --   --   --   --   --    Liver Function Tests: Recent Labs  Lab 11/15/17 1200  AST 31  ALT 18  ALKPHOS 132*  BILITOT 1.1  PROT 6.4*  ALBUMIN 3.0*   No results for input(s): LIPASE, AMYLASE in the last 168 hours. Recent Labs  Lab 11/15/17 1200 11/18/17 1000 11/20/17 0630  AMMONIA 58* 56* 35    CBC: Recent Labs  Lab 11/15/17 0350  11/17/17 0228 11/18/17 0547 11/19/17 0230 11/20/17 0605 11/21/17 0500  WBC 6.8   < > 5.8 5.2 5.5 6.3 5.7  NEUTROABS 5.5  --   --   --   --   --   --   HGB 8.5*   < >  9.9* 9.2* 9.4* 9.7* 9.7*  HCT 30.2*   < > 33.3* 32.1* 32.3* 32.7* 32.6*  MCV 76.8*   < > 77.3* 79.1 79.4 79.2 79.7  PLT 201   < > 206 188 167 159 138*   < > = values in this interval not displayed.    INR: Recent Labs  Lab 11/17/17 0228 11/18/17 0547 11/19/17 0230 11/20/17 0605 11/21/17 0500  INR 1.36 1.39 1.66 1.94 2.13    Other results:     Imaging   No results found.   Medications:     Scheduled Medications: . amiodarone  200 mg Oral Daily  . citalopram  20 mg Oral Daily  . digoxin  0.125 mg Oral Daily  . docusate sodium  200 mg Oral BID  . gabapentin  300 mg Oral BID  . insulin aspart  0-15 Units Subcutaneous TID WC  . insulin aspart  5 Units Subcutaneous TID WC  . insulin glargine  10 Units Subcutaneous Q2200  . lactulose  30 g Oral BID  . magnesium oxide  400 mg Oral Daily  . midodrine  5 mg Oral TID WC  . multivitamin with minerals  1 tablet Oral Daily  . pantoprazole  40 mg Oral BID AC  . potassium chloride SA  40 mEq Oral BID  . sodium chloride flush  10-40 mL  Intracatheter Q12H  . spironolactone  12.5 mg Oral Daily  . sucralfate  1 g Oral TID WC & HS  . Warfarin - Pharmacist Dosing Inpatient   Does not apply q1800    Infusions: . DOBUTamine 7.5 mcg/kg/min (11/21/17 0000)    PRN Medications: acetaminophen, ondansetron (ZOFRAN) IV, ondansetron, senna-docusate, sodium chloride flush, traMADol, zolpidem   Patient Profile   Ronald Milleris a 62 y.o.male with history of chronic systolic HF s/p Medtronic ICD 2014, CAD s/p CABG in 2000, HTN, Hx of cocaine abuse, Tobacco abuse, Depression, PAF, and COPD. HM3 placed 09/09/2016. In April 2018 he was placed on milrinone but later switched to dobutamine. He remains on dobutamine 7.6mcg.   Assessment/Plan:    1. 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 Has Medtronic ICD. He was admitted with intractable RV failure despite home dobutamine. Volume status much improved. CVP 7.  Overall weight down 47 pounds with paracentesis and IV diuresis. Stop IV lasix. Start po diuretics tomorrow-->torsemide 100 mg in am and 50 mg in pm.  - Continue dobutamine 7.5 mcg/kg/min. (On chronically for RV failure) -maps soft over night. Map 62. Start mididrine 10 mg tid now.   - Continue dobutamine for RV support -- Not a good heart transplant candidate. Has reached end stage biventricular failure.  2. CAD:  - Stable. No s/s ischemia.  3. DMII: Home regimen + sliding scale.  - No change.  4. PAF: ?underlying atrial rhythm - EKG 11/13/17 with profound artifact.  5. Anemia, iron deficiency: Had capsule endoscopy 04/2017 with severe gastritis. Continue carafate.  - Hgb stable.  6.  Anxiety/Depression  - Continue celexa. Stable 7. H/O RUE DVT: warfarin 8. Chronic Anticoagulation: On coumadin, INR goal 1.8 - 2.4.  -INR 2.1. Discussed with pharmacy. .  9. Cardiac cirrhosis with ascites: Already had paracentesis this admission.  Abdomen remains distended.   - Repeat US +/- paracentesis 11/16/17  with another 5.7 L out.10. Confusion/Hepatic Encephalopathy - Ammonia 58 11/15/17. + asterixis on exam - Lactulose increased 2/15 10. Hyponatremia  Sodium 130.   Home today. [facility] to resume [place] for dobutamine.  Follow up later this week for INR.   I reviewed the LVAD parameters from today, and compared the results to the patient's prior recorded data.  No programming changes were made.  The LVAD is functioning within specified parameters.  The patient performs LVAD self-test daily.  LVAD interrogation was negative for any significant power changes, alarms or PI events/speed drops.  LVAD equipment check completed and is in good working order.  Back-up equipment present.   LVAD education done on emergency procedures and precautions and reviewed exit site care.  Length of Stay: [place], NP 11/21/2017, 7:44 AM  VAD Team --- VAD ISSUES ONLY--- Pager [phone] (7am - 7am)  Advanced Heart Failure Team  Pager [phone] (M-F; 7a - 4p)  Please contact [facility] for night-coverage after hours (4p -7a ) and weekends on [email or web address]  Patient seen and examined with Darrick Grinder, NP. We discussed all aspects of the encounter. I agree with the assessment and plan as stated above.   He is much improved after 47 pound fluid removal. MAPs a bit soft. Will increase midodrine back to 10 tid.  INR 2.1. VAD interrogated personally. Parameters stable. Ammonia down. Continue lactulose 30 bid.   Can go home today with close f/u in [facility].   Glori Bickers, MD  8:37 AM

## 2017-11-21 NOTE — Discharge Summary (Addendum)
Advanced Heart Failure Team  Discharge Summary   Patient ID: Ronald Miller. MRN: [id number], DOB/AGE: 61-Jan-1957 61 y.o. Admit date: 11/09/2017 D/C date:     11/21/2017   Primary Discharge Diagnoses:  1. 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 CAD 3. DMII 4. PAF 5. Anemia   6. Anxiety/Depression 7. H/O RUE DVT  8. Chronic Anticoagulation  INR 1.8-2.3 on coumadin.   9. Cardiac Cirrhosis with Ascites 10. Hyponatremia  Hospital Course:  Ronald Milleris a 62 y.o.male with history of chronic systolic HF s/p Medtronic ICD 2014, CAD s/p CABG in 2000, HTN, Hx of cocaine abuse, Tobacco abuse, Depression, PAF, and COPD. HM3 placed 09/09/2016. In April 2018 he was placed on milrinone but later switched to dobutamine. Chronically on dobutamine 7.80mcg.  Admitted from the [facility] with marked volume and ascites. Hospital course complicated by ascites, RV failure, and hepatic encephalopathy. IR consulted and he underwent paracentesis on 2/7 and 2/12. He had over 11 liters removed from paracentesis.   CVP and renal function followed closely. Diuresed with IV lasix and transitioned back to home torsemide 100 mg/50mg  daily. Overall he diuresed 47 pounds.  Plan to continue dobutamine 7.5 mcg with [facility] to follow at home.   He developed confusion so ammonia was obtained. Ammonia was 58 and he was started on lactulose. Ammonia level went back down 35. Plan to continue lacutulose. Check Lactulose on Thursday.   VAD parameters remains stable. Maps soft so midodrine was increased to 10 mg three times daily. INR followed closely. Plan to check INR later this week.   He will continue to be followed closely in the VAD clinic. He will follow up later this week.    HeartMate II LVAD:  Flow 5.5liters/min, speed 5800, power 4.5 PI 2.4        Discharge Weight: 195 pounds Discharge Vitals: Blood pressure (!) 85/52, pulse 70, temperature  97.9 F (36.6 C), temperature source Axillary, resp. rate 19, height 5\' 9"  (1.753 m), weight 195 lb 5.2 oz (88.6 kg), SpO2 96 %.  Labs: Lab Results  Component Value Date   WBC 5.7 11/21/2017   HGB 9.7 (L) 11/21/2017   HCT 32.6 (L) 11/21/2017   MCV 79.7 11/21/2017   PLT 138 (L) 11/21/2017    Recent Labs  Lab 11/15/17 1200  11/21/17 0500  NA  --    < > 130*  K  --    < > 4.6  CL  --    < > 89*  CO2  --    < > 29  BUN  --    < > 23*  CREATININE  --    < > 1.16  CALCIUM  --    < > 8.6*  PROT 6.4*  --   --   BILITOT 1.1  --   --   ALKPHOS 132*  --   --   ALT 18  --   --   AST 31  --   --   GLUCOSE  --    < > 153*   < > = values in this interval not displayed.   Lab Results  Component Value Date   CHOL 153 08/15/2013   HDL 37 (L) 08/15/2013   LDLCALC 89 08/15/2013   TRIG 133 08/15/2013   BNP (last 3 results) Recent Labs    11/30/16 1158 01/21/17 1230  BNP 565.5* 649.9*  ProBNP (last 3 results) No results for input(s): PROBNP in the last 8760 hours.   Diagnostic Studies/Procedures   No results found.  Discharge Medications   Allergies as of 11/21/2017      Reactions   Chlorhexidine Gluconate Itching, Rash   Codeine Nausea And Vomiting, Other (See Comments)   In high doses   Trazodone And Nefazodone Other (See Comments)   Dizziness with subsequent fall    Lipitor [atorvastatin] Nausea Only, Other (See Comments)   Nausea with high doses, tolerates 20mg  dose (08/22/16)   Tape Other (See Comments)   Paper tape is ok      Medication List    TAKE these medications   acetaminophen 500 MG tablet Commonly known as:  TYLENOL Take 1,000 mg by mouth every 6 (six) hours as needed for moderate pain or headache.   amiodarone 200 MG tablet Commonly known as:  PACERONE TAKE ONE TABLET BY MOUTH ONCE DAILY What changed:    how much to take  how to take this  when to take this   citalopram 20 MG tablet Commonly known as:  CELEXA Take 1 tablet (20 mg  total) by mouth daily.   digoxin 0.125 MG tablet Commonly known as:  DIGOX Take 1 tablet (0.125 mg total) daily by mouth.   DOBUTamine 4-5 MG/ML-% infusion Commonly known as:  DOBUTREX Inject 714.75 mcg/min into the vein continuous. PER [facility] pharmacy What changed:  how much to take   docusate sodium 100 MG capsule Commonly known as:  COLACE Take 200 mg by mouth at bedtime.   gabapentin 300 MG capsule Commonly known as:  NEURONTIN Take 1 capsule (300 mg total) by mouth 2 (two) times daily.   insulin aspart 100 UNIT/ML FlexPen Commonly known as:  NOVOLOG FLEXPEN Inject 5 Units into the skin 3 (three) times daily with meals.   Insulin Glargine 100 UNIT/ML Solostar Pen Commonly known as:  LANTUS SOLOSTAR Inject 10 Units into the skin daily at 10 pm.   lactulose 10 GM/15ML solution Commonly known as:  CHRONULAC Take 45 mLs (30 g total) by mouth 2 (two) times daily.   magnesium oxide 400 (241.3 Mg) MG tablet Commonly known as:  MAG-OX Take 1 tablet (400 mg total) by mouth daily.   midodrine 10 MG tablet Commonly known as:  PROAMATINE Take 1 tablet (10 mg total) by mouth 3 (three) times daily with meals. What changed:    medication strength  how much to take   multivitamin with minerals Tabs tablet Take 1 tablet by mouth daily.   ondansetron 4 MG disintegrating tablet Commonly known as:  ZOFRAN ODT Take 1 tablet (4 mg total) by mouth every 8 (eight) hours as needed for nausea.   pantoprazole 40 MG tablet Commonly known as:  PROTONIX Take 1 tablet (40 mg total) by mouth 2 (two) times daily before a meal.   potassium chloride SA 20 MEQ tablet Commonly known as:  K-DUR,KLOR-CON Take 1 tablet (20 mEq total) by mouth 2 (two) times daily. Start taking on:  11/22/2017   senna-docusate 8.6-50 MG tablet Commonly known as:  SENOKOT S Take 2 tablets by mouth at bedtime as needed for mild constipation.   spironolactone 25 MG tablet Commonly known as:  ALDACTONE Take 0.5  tablets (12.5 mg total) by mouth daily.   sucralfate 1 GM/10ML suspension Commonly known as:  CARAFATE Take 10 mLs (1 g total) by mouth 4 (four) times daily -  with meals and at bedtime.   torsemide  100 MG tablet Commonly known as:  DEMADEX Take 100mg  in the morning and 50mg  in the evening Start taking on:  11/22/2017   warfarin 5 MG tablet Commonly known as:  COUMADIN Take as directed. If you are unsure how to take this medication, talk to your nurse or doctor. Original instructions:  Take  1 tablet (5 mg) daily except 1 and 1/2 tablets (7.5 mg) on Monday and Friday   zolpidem 10 MG tablet Commonly known as:  AMBIEN Take 1 tablet (10 mg total) by mouth at bedtime as needed for sleep.            Durable Medical Equipment  (From admission, onward)        Start     Ordered   11/21/17 0917  Heart failure home health orders  (Heart failure home health orders / Face to face)  Once    Comments:  Heart Failure Follow-up Care:  Verify follow-up appointments per Patient Discharge Instructions. Confirm transportation arranged. Reconcile home medications with discharge medication list. Remove discontinued medications from use. Assist patient/caregiver to manage medications using pill box. Reinforce low sodium food selection Assessments: Vital signs and oxygen saturation at each visit. Assess home environment for safety concerns, caregiver support and availability of low-sodium foods. Consult Education officer, museum, PT/OT, Dietitian, and CNA based on assessments. Perform comprehensive cardiopulmonary assessment. Notify MD for any change in condition or weight gain of 3 pounds in one day or 5 pounds in one week with symptoms. Daily Weights and Symptom Monitoring: Ensure patient has access to scales. Teach patient/caregiver to weigh daily before breakfast and after voiding using same scale and record.    Teach patient/caregiver to track weight and symptoms and when to notify  Provider. Activity: Develop individualized activity plan with patient/caregiver.  Dobutamine 7.5 mcg/kg/min. Resume Care.  [facility] to provide  Labs every other week to include BMET, Mg, and CBC with Diff. Additional as needed.   J1250 Dobutamine 7.5  mcg/kg/min X 52 weeks A4221 Supplies for maintenance of drug infusion catheter A4222 Supplies for the external drug infusion per cassette or bag E0781 Ambulatory Infusion pump  Question Answer Comment  Heart Failure Follow-up Care Advanced [facility] Failure ([facility]F) [facility] at [phone]   Obtain the following labs Basic Metabolic Panel   Lab frequency Weekly   Fax lab results to AHF Clinic at [phone]   Diet Low Sodium Heart Healthy   Fluid restrictions: 1500 mL Fluid      11/21/17 0917      Disposition   The patient will be discharged in stable condition to home. Discharge Instructions    (HEART FAILURE PATIENTS) Call MD:  Anytime you have any of the following symptoms: 1) 3 pound weight gain in 24 hours or 5 pounds in 1 week 2) shortness of breath, with or without a dry hacking cough 3) swelling in the hands, feet or stomach 4) if you have to sleep on extra pillows at night in order to breathe.   Complete by:  As directed    Diet - low sodium heart healthy   Complete by:  As directed    Heart Failure patients record your daily weight using the same scale at the same time of day   Complete by:  As directed    INR  Goal: 1.8 - 2.3   Complete by:  As directed    Goal:  1.8 - 2.3   Increase activity slowly   Complete by:  As directed    Page  VAD Coordinator at [phone]  Notify for: any VAD alarms, sustained elevations of power >10 watts, sustained drop in Pulse Index <3   Complete by:  As directed    Notify for:   any VAD alarms sustained elevations of power >10 watts sustained drop in Pulse Index <3     Speed Settings:   Complete by:  As directed    Fixed 5800 RPM Low 5500 RPM     Follow-up Information    Artez Regis,  Shaune Pascal, MD Follow up on 11/24/2017.   Specialty:  Cardiology Why:  at 2:00 [place] information: [place] [address] [phone]             Duration of Discharge Encounter: Greater than 35 minutes   Signed, Darrick Grinder NP-C  11/21/2017, 10:27 AM   Patient seen and examined with Darrick Grinder, NP. We discussed all aspects of the encounter. I agree with the assessment and plan as stated above.   Much improved with massive diuresis and paracentesis x 2. Can go home. Continue dobutamine for RV support and midodrine for BP support. F/u in VAD clinic. VAD interrogated personally. Parameters stable.  Glori Bickers, MD  10:58 AM

## 2017-11-23 ENCOUNTER — Encounter (HOSPITAL_COMMUNITY): Payer: Self-pay

## 2017-11-24 ENCOUNTER — Encounter (HOSPITAL_COMMUNITY): Payer: Self-pay

## 2017-11-24 ENCOUNTER — Other Ambulatory Visit (HOSPITAL_COMMUNITY): Payer: Self-pay | Admitting: Unknown Physician Specialty

## 2017-11-24 DIAGNOSIS — Z95811 Presence of heart assist device: Secondary | ICD-10-CM

## 2017-11-24 DIAGNOSIS — Z7901 Long term (current) use of anticoagulants: Secondary | ICD-10-CM

## 2017-11-25 ENCOUNTER — Telehealth (HOSPITAL_COMMUNITY): Payer: Self-pay | Admitting: Unknown Physician Specialty

## 2017-11-25 ENCOUNTER — Inpatient Hospital Stay (HOSPITAL_COMMUNITY): Admission: RE | Admit: 2017-11-25 | Payer: Self-pay | Source: Ambulatory Visit

## 2017-11-25 ENCOUNTER — Other Ambulatory Visit (HOSPITAL_COMMUNITY): Payer: Self-pay | Admitting: Internal Medicine
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
## 2017-11-30 ENCOUNTER — Ambulatory Visit (HOSPITAL_COMMUNITY): Payer: Self-pay | Admitting: Pharmacist

## 2017-11-30 ENCOUNTER — Ambulatory Visit (HOSPITAL_COMMUNITY)
Admission: RE | Admit: 2017-11-30 | Discharge: 2017-11-30 | Disposition: A | Discharge status: Home / Self Care | Payer: Medicare HMO | Source: Ambulatory Visit | Attending: Cardiology | Admitting: Cardiology

## 2017-11-30 ENCOUNTER — Encounter (HOSPITAL_COMMUNITY): Payer: Self-pay

## 2017-11-30 VITALS — BP 91/57 | HR 90 | Resp 16 | Ht 69.0 in | Wt 213.2 lb

## 2017-11-30 DIAGNOSIS — K7290 Hepatic failure, unspecified without coma: Secondary | ICD-10-CM

## 2017-11-30 DIAGNOSIS — J90 Pleural effusion, not elsewhere classified: Secondary | ICD-10-CM | POA: Insufficient documentation

## 2017-11-30 DIAGNOSIS — Z951 Presence of aortocoronary bypass graft: Secondary | ICD-10-CM | POA: Diagnosis not present

## 2017-11-30 DIAGNOSIS — I509 Heart failure, unspecified: Secondary | ICD-10-CM

## 2017-11-30 DIAGNOSIS — K7682 Hepatic encephalopathy: Secondary | ICD-10-CM

## 2017-11-30 DIAGNOSIS — E119 Type 2 diabetes mellitus without complications: Secondary | ICD-10-CM | POA: Insufficient documentation

## 2017-11-30 DIAGNOSIS — I5022 Chronic systolic (congestive) heart failure: Secondary | ICD-10-CM | POA: Insufficient documentation

## 2017-11-30 DIAGNOSIS — I480 Paroxysmal atrial fibrillation: Secondary | ICD-10-CM

## 2017-11-30 DIAGNOSIS — F418 Other specified anxiety disorders: Secondary | ICD-10-CM | POA: Insufficient documentation

## 2017-11-30 DIAGNOSIS — Z794 Long term (current) use of insulin: Secondary | ICD-10-CM | POA: Diagnosis not present

## 2017-11-30 DIAGNOSIS — I110 Hypertensive heart disease with heart failure: Secondary | ICD-10-CM | POA: Diagnosis not present

## 2017-11-30 DIAGNOSIS — I5082 Biventricular heart failure: Secondary | ICD-10-CM | POA: Diagnosis not present

## 2017-11-30 DIAGNOSIS — M545 Low back pain: Secondary | ICD-10-CM | POA: Diagnosis not present

## 2017-11-30 DIAGNOSIS — I2510 Atherosclerotic heart disease of native coronary artery without angina pectoris: Secondary | ICD-10-CM | POA: Insufficient documentation

## 2017-11-30 DIAGNOSIS — Z79899 Other long term (current) drug therapy: Secondary | ICD-10-CM | POA: Insufficient documentation

## 2017-11-30 DIAGNOSIS — D508 Other iron deficiency anemias: Secondary | ICD-10-CM | POA: Insufficient documentation

## 2017-11-30 DIAGNOSIS — Z95811 Presence of heart assist device: Secondary | ICD-10-CM | POA: Diagnosis not present

## 2017-11-30 DIAGNOSIS — Z7901 Long term (current) use of anticoagulants: Secondary | ICD-10-CM | POA: Diagnosis not present

## 2017-11-30 DIAGNOSIS — Z950 Presence of cardiac pacemaker: Secondary | ICD-10-CM | POA: Diagnosis not present

## 2017-11-30 DIAGNOSIS — R252 Cramp and spasm: Secondary | ICD-10-CM | POA: Diagnosis not present

## 2017-11-30 DIAGNOSIS — I50810 Right heart failure, unspecified: Secondary | ICD-10-CM

## 2017-11-30 DIAGNOSIS — K7460 Unspecified cirrhosis of liver: Secondary | ICD-10-CM | POA: Insufficient documentation

## 2017-11-30 DIAGNOSIS — R188 Other ascites: Secondary | ICD-10-CM

## 2017-11-30 LAB — COMPREHENSIVE METABOLIC PANEL
ALBUMIN: 3.2 g/dL — AB (ref 3.5–5.0)
ALT: 23 U/L (ref 17–63)
ANION GAP: 12 (ref 5–15)
AST: 28 U/L (ref 15–41)
Alkaline Phosphatase: 183 U/L — ABNORMAL HIGH (ref 38–126)
BILIRUBIN TOTAL: 0.7 mg/dL (ref 0.3–1.2)
BUN: 23 mg/dL — AB (ref 6–20)
CO2: 28 mmol/L (ref 22–32)
Calcium: 8.6 mg/dL — ABNORMAL LOW (ref 8.9–10.3)
Chloride: 93 mmol/L — ABNORMAL LOW (ref 101–111)
Creatinine, Ser: 1.51 mg/dL — ABNORMAL HIGH (ref 0.61–1.24)
GFR calc Af Amer: 56 mL/min — ABNORMAL LOW (ref >60)
GFR, EST NON AFRICAN AMERICAN: 48 mL/min — AB (ref >60)
Glucose, Bld: 160 mg/dL — ABNORMAL HIGH (ref 65–99)
POTASSIUM: 4.9 mmol/L (ref 3.5–5.1)
Sodium: 133 mmol/L — ABNORMAL LOW (ref 135–145)
TOTAL PROTEIN: 6.9 g/dL (ref 6.5–8.1)

## 2017-11-30 LAB — CBC
HEMATOCRIT: 34.5 % — AB (ref 39.0–52.0)
HEMOGLOBIN: 10.2 g/dL — AB (ref 13.0–17.0)
MCH: 24.9 pg — ABNORMAL LOW (ref 26.0–34.0)
MCHC: 29.6 g/dL — ABNORMAL LOW (ref 30.0–36.0)
MCV: 84.1 fL (ref 78.0–100.0)
Platelets: 168 10*3/uL (ref 150–400)
RBC: 4.1 MIL/uL — AB (ref 4.22–5.81)
RDW: 27.6 % — AB (ref 11.5–15.5)
WBC: 7 10*3/uL (ref 4.0–10.5)

## 2017-11-30 LAB — PROTIME-INR
INR: 1.57
Prothrombin Time: 18.6 seconds — ABNORMAL HIGH (ref 11.4–15.2)

## 2017-11-30 LAB — AMMONIA: Ammonia: 52 umol/L — ABNORMAL HIGH (ref 9–35)

## 2017-11-30 LAB — LACTATE DEHYDROGENASE: LDH: 157 U/L (ref 98–192)

## 2017-11-30 MED ORDER — CYCLOBENZAPRINE HCL 10 MG PO TABS: 10.0000 mg | ORAL_TABLET | Freq: Three times a day (TID) | ORAL | 1 refills | Status: AC | PRN

## 2017-11-30 NOTE — Patient Instructions (Signed)
Start taking flexeril 3 times a day as needed for back pain/spasms.  Start taking 1 tablet  Metolazone every Monday and Friday. Take one today when you get home.

## 2017-11-30 NOTE — Progress Notes (Signed)
Patient presents for hospital  follow up in [facility] today. Reports no problems with VAD equipment or concerns with drive line.  States he is having lower back spasms. He does not know how he hurt his back.  Vital Signs:  Doppler Pressure 90   Automatc BP: 91/57 (70) HR:90   SPO2:unable to obtain %  Weight: 213.2 lb w/o eqt Last weight: 195 lb at discharge Home weights: 212 lbs this AM   VAD Indication: Destination Therapy - eval complete at [facility]  VAD interrogation & Equipment Management: Speed:5800 Flow: 5.4 Power:4.6 w    PI:2.1  Alarms: no clinical alarms Events: none  Fixed speed 5800 Low speed limit: 5500  Primary Controller:  Replace back up battery in 62months. Back up controller:   Replace back up battery in 18 months.  Annual Equipment Maintenance on UBC/PM was performed on 09/2017.   I reviewed the LVAD parameters from today and compared the results to the patient's prior recorded data. LVAD interrogation was NEGATIVE for significant power changes, NEGATIVE for clinical alarms and STABLE for PI events/speed drops. No programming changes were made and pump is functioning within specified parameters. Pt is performing daily controller and system monitor self tests along with completing weekly and monthly maintenance for LVAD equipment.  LVAD equipment check completed and is in good working order. Back-up equipment present. Charged back up battery and performed self-test on equipment.   Exit Site Care: Drive line is being maintained weekly  by VAD Coordinators. Drive line exit site well healed and incorporated. The velour is fully implanted at exit site. Dressing dry and intact. No erythema or drainage. Stabilization device present and accurately applied. Pt denies fever or chills. Pt states they have adequate dressing supplies at home.   Significant Events on VAD Support:  11/30/16> Milrinone gtt at discharge 01/27/17>dobutamine at 5 mcg/kg/mingtt at  discharge 03/08/17> RV failure 05/19/17 >dobutamine decreased from 7.5 to 6  06/2017> admit with syncope, AMS, Dobutamine at 7.76mcg  Device:Protect Therapies: off; end of service 08/2016 Last check: complete interrogation 05/26/2017. DDD (lower rate 60); BiV pacing with 96% AS-VP  BP & Labs:  MAP 90 - Doppler is reflecting modified systolic  Hgb pending - No S/S of bleeding. Specifically denies melena/BRBPR or nosebleeds.  LDH stable at 157 with established baseline of 150- 250. Denies tea-colored urine. No power elevations noted on interrogation.   Plan: 1. Start taking 1 Metolazone tablet every Monday and Friday. Patient has this medication at home. Take 1 tablet today. 2. Start flexeril prn for back spasms. Prescription has been sent to [facility].  Balinda Quails RN [place] Coordinator   Office: [phone] 24/7 Emergency VAD Pager: [phone]

## 2017-12-01 ENCOUNTER — Telehealth (HOSPITAL_COMMUNITY): Payer: Self-pay | Admitting: Unknown Physician Specialty

## 2017-12-01 NOTE — Telephone Encounter (Signed)
Spoke with pts HH nurse-Garland Smouse. She states that the pt did not take a Metolazone yesterday as instructed, so he took it this morning. Judson Roch states that the pts weight was up 5 pounds this morning. Pt/nurse was instructed if pts weight does not decrease in the morning from the metolazone to call the office. Pt/nurse verbalized understanding of all instructions given.   Tanda Rockers RN, BSN VAD Coordinator 24/7 Pager [phone]

## 2017-12-02 DIAGNOSIS — I2510 Atherosclerotic heart disease of native coronary artery without angina pectoris: Secondary | ICD-10-CM | POA: Diagnosis not present

## 2017-12-03 NOTE — Progress Notes (Signed)
Advanced Heart Failure Clinic Note    Referring Provider: Darylene Price, FNP Primary Care: [facility] Primary Cardiologist: Dr Haroldine Laws.  Transplant Center- [facility]  HPI: Ronald Miller. is a 62 y.o. male with history of chronic systolic HF s/p Medtronic ICD 2014, CAD s/p CABG in 2000, HTN, Hx of cocaine abuse, Tobacco abuse, Depression, PAF, and COPD. HM3 placed 09/09/2016. In April 2018 he was placed on milrinone but later switched to dobutamine. He remains on dobutamine 7.90mcg.   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  06/01/2017- Capsule Endoscopy- normal except  tiny angiectasia, nonbleeding in the right colon.  Admitted 2/6-2/18/2019 with recurrent volume overload, RHF and ascites despite dobutamine support. Underwent paracentesis on 2/7 and 2/12. He had over 11 liters removed from paracentesis. Diuresed with IV lasix and transitioned back to home torsemide 100 mg/50mg  daily. Overall he lost 47 pounds.  Also treated for hepatic encephalopathy. MAPs were soft with diuresis so midodrine increased to 10 tid. Weight on d/c 195 pounds.   Returns for LVAD follow up. Says he doesn't feel well. Main complaint is low back pain and spasms. Drinking a lot of fluid. Weight back up to 212. Remains on dobutamine at 7.5 and midodrine. Takin torsemide 100/50. Has not used metolazone. Remains fatigued and SOB with exertion. Ab starting to bloat again. Denies orthopnea or PND. No fevers, chills or problems with driveline. No bleeding, melena or neuro symptoms. No VAD alarms.   VAD Indication: Destination Therapy - eval complete at [facility]  VAD interrogation & Equipment Management: Speed:5800 Flow: 5.4 Power:4.6 w PI:2.1  Alarms: no clinical alarms Events: none  Fixed speed 5800 Low speed limit: 5500  Primary Controller: Replace back up battery in 60months. Back up controller: Replace back up battery  in 40months.  Annual Equipment Maintenance on UBC/PM was performed on 09/2017.  Past Medical History:  Diagnosis Date  . AICD (automatic cardioverter/defibrillator) present   . ASCVD (arteriosclerotic cardiovascular disease)   . Chronic systolic CHF (congestive heart failure) ([place])   . COPD (chronic obstructive pulmonary disease) ([place])   . Coronary artery disease   . Depression   . Diabetes mellitus   . GI bleed   . History of cocaine abuse   . Hypertension   . Presence of permanent cardiac pacemaker   . Shortness of breath dyspnea   . Suicidal ideation   . Tobacco abuse     Current Outpatient Medications  Medication Sig Dispense Refill  . acetaminophen (TYLENOL) 500 MG tablet Take 1,000 mg by mouth every 6 (six) hours as needed for moderate pain or headache.    Marland Kitchen amiodarone (PACERONE) 200 MG tablet TAKE ONE TABLET BY MOUTH ONCE DAILY (Patient taking differently: TAKE ONE TABLET (200mg ) BY MOUTH ONCE DAILY) 60 tablet 6  . citalopram (CELEXA) 20 MG tablet Take 1 tablet (20 mg total) by mouth daily. 30 tablet 6  . digoxin (DIGOX) 0.125 MG tablet Take 1 tablet (0.125 mg total) daily by mouth. 30 tablet 6  . DOBUTamine (DOBUTREX) 4-5 MG/ML-% infusion Inject 714.75 mcg/min into the vein continuous. PER [facility] pharmacy 250 mL 11  . docusate sodium (COLACE) 100 MG capsule Take 200 mg by mouth at bedtime.     . gabapentin (NEURONTIN) 300 MG capsule Take 1 capsule (300 mg total) by mouth 2 (two) times daily. 180 capsule 6  . insulin aspart (NOVOLOG FLEXPEN) 100 UNIT/ML FlexPen Inject 5 Units into  the skin 3 (three) times daily with meals. 15 mL 11  . Insulin Glargine (LANTUS SOLOSTAR) 100 UNIT/ML Solostar Pen Inject 10 Units into the skin daily at 10 pm. 1 pen 6  . lactulose (CHRONULAC) 10 GM/15ML solution Take 45 mLs (30 g total) by mouth 2 (two) times daily. 240 mL 6  . magnesium oxide (MAG-OX) 400 (241.3 Mg) MG tablet Take 1 tablet (400 mg total) by mouth daily. 30 tablet 6  . midodrine  (PROAMATINE) 10 MG tablet Take 1 tablet (10 mg total) by mouth 3 (three) times daily with meals. 90 tablet 6  . Multiple Vitamin (MULTIVITAMIN WITH MINERALS) TABS tablet Take 1 tablet by mouth daily.    . ondansetron (ZOFRAN ODT) 4 MG disintegrating tablet Take 1 tablet (4 mg total) by mouth every 8 (eight) hours as needed for nausea. 6 tablet 0  . pantoprazole (PROTONIX) 40 MG tablet Take 1 tablet (40 mg total) by mouth 2 (two) times daily before a meal. 60 tablet 6  . potassium chloride SA (K-DUR,KLOR-CON) 20 MEQ tablet Take 1 tablet (20 mEq total) by mouth 2 (two) times daily. 60 tablet 3  . senna-docusate (SENOKOT S) 8.6-50 MG tablet Take 2 tablets by mouth at bedtime as needed for mild constipation. 30 tablet 6  . spironolactone (ALDACTONE) 25 MG tablet Take 0.5 tablets (12.5 mg total) by mouth daily. 15 tablet 6  . sucralfate (CARAFATE) 1 GM/10ML suspension Take 10 mLs (1 g total) by mouth 4 (four) times daily -  with meals and at bedtime. 420 mL 6  . torsemide (DEMADEX) 100 MG tablet Take 100mg  in the morning and 50mg  in the evening 90 tablet 6  . warfarin (COUMADIN) 5 MG tablet Take  1 tablet (5 mg) daily except 1 and 1/2 tablets (7.5 mg) on Monday and Friday 34 tablet 5  . zolpidem (AMBIEN) 10 MG tablet Take 1 tablet (10 mg total) by mouth at bedtime as needed for sleep. 30 tablet 2  . cyclobenzaprine (FLEXERIL) 10 MG tablet Take 1 tablet (10 mg total) by mouth 3 (three) times daily as needed for muscle spasms. 30 tablet 1   No current facility-administered medications for this encounter.     Chlorhexidine gluconate; Codeine; Trazodone and nefazodone; Lipitor [atorvastatin]; and Tape  REVIEW OF SYSTEMS: All systems negative except as listed in HPI, PMH and Problem list.     Vitals:   11/30/17 1208  BP: (!) 91/57  Pulse: 90  Resp: 16  SpO2: 98%    Wt Readings from Last 3 Encounters:  11/30/17 213 lb 3.2 oz (96.7 kg)  11/21/17 195 lb 5.2 oz (88.6 kg)  11/09/17 239 lb 6.4 oz  (108.6 kg)    Vital Signs:  Doppler Pressure 90                Automatc BP: 91/57 (70) HR:90  SPO2:unable to obtain %  Weight: 213.2 lb w/o eqt Last weight: 195 lb at discharge Home weights: 212 lbs this AM   Physical exam: General:  Chronically ill appearing HEENT: normal  Neck: supple. JVP to jaw  Carotids 2+ bilat; no bruits. No lymphadenopathy or thryomegaly appreciated. Cor: LVAD hum.  Lungs: Clear. Abdomen: obese soft, nontender, + distended. No hepatosplenomegaly. No bruits or masses. Good bowel sounds. Driveline site clean. Anchor in place.  Tender along left para-spinal muscles. No CVA tenderness. No focal weakness.  Extremities: no cyanosis, clubbing, rash. Warm no 2+ edema  Neuro: alert & oriented x 3. No focal  deficits. Moves all 4 without problem No asterixis  ASSESSMENT AND PLAN: 1. Chronic end-stage biventricular systolic HF: LVEF 09% due to ICM -> s/p Echo 08/24/16 LVEF 15%, RV mild dilated, moderately reduced. 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  Has been turned down for [facility] at [facility].  - Complicated by RV failure. Failed milrinone due to persistent RV failure/shock..   - Remains on dobutamine 7.5 mcg.  - NYHA III-IIIB - Recently admitted for severe volume overload and ascites. 47 pounds removed. However now drinking lots of fluid and weight back up 17 pounds in less than a week.  - Discussed need to limit fluid intake - Continue torsemide 100/5. Add metolazone 2.5 every Monday and Friday. Take first tablet today. - Midodrine recently increased to 10 TID for low MAPs and cirrhotic physiology.  - Renal function stable. Creatinine 1.51 today - Off sildenafil due to hypotension - No beta blocker with RV failure.  - VAD interrogated personally. Parameters stable. - Warfarin with goal INR 2.0 - 2.5 + ASA 81 daily. INR 1.57  Discussed dosing with PharmD personally. - Driveline ok - LDH 470 - Not transplant candidate due to social issues, noncompliance and cirrhosis  (has been turned [facility] by [facility]) 2. CAD: Severe 3v- CAD s/p CABG with occluded grafts as above except for LIMA.  -No s/s ischemia. Continue current therapy.  3. DMII:  - Per PCP - HgbA1c has improved 4. Tobacco abuse:  - Reports complete cessation 5. Atrial fibrillation, paroxysmal - Remains in NSR.  Continue amio 200 daily. Careful with cirrhosis  6. Left pleural effusion - underwent repeat thoracentesis in Jan. 2018.  - no recurrence. No effusion on exam today  7.  Anxiety/Depression - Continue Celexa 20mg  daily . - Social issues still a concern  8. Cirrhosis with hepatic encephalopathy - likely combination ETOH and chronic RV failure - No asterixis today - Continue lactulose - Repeat paracentesis as needed 10. Anemia, iron-deficiency:   - Has had GI work up with EGD and Capsule Endoscopy showing severe gastropathy.Today hgb is 10.2.  Continue cararfate.  11. Low back pain/spasms - Will give flexeril. If not improving will refer to ortho. Instructed to go to ER if worsening acutely.   Total time spent personally =  45 minutes. Over half that time spent discussing above.   Glori Bickers  MD  11:02 PM

## 2017-12-07 ENCOUNTER — Other Ambulatory Visit (HOSPITAL_COMMUNITY): Payer: Self-pay | Admitting: *Deleted

## 2017-12-07 ENCOUNTER — Encounter (HOSPITAL_COMMUNITY): Payer: Self-pay

## 2017-12-07 ENCOUNTER — Ambulatory Visit (HOSPITAL_COMMUNITY): Payer: Self-pay | Admitting: Pharmacist

## 2017-12-07 ENCOUNTER — Ambulatory Visit (HOSPITAL_COMMUNITY)
Admission: RE | Admit: 2017-12-07 | Discharge: 2017-12-07 | Disposition: A | Discharge status: Home / Self Care | Payer: Medicare HMO | Source: Ambulatory Visit | Attending: Internal Medicine | Admitting: Internal Medicine

## 2017-12-07 VITALS — BP 90/0 | HR 93 | Ht 69.0 in | Wt 204.2 lb

## 2017-12-07 DIAGNOSIS — F419 Anxiety disorder, unspecified: Secondary | ICD-10-CM | POA: Diagnosis not present

## 2017-12-07 DIAGNOSIS — F329 Major depressive disorder, single episode, unspecified: Secondary | ICD-10-CM | POA: Diagnosis not present

## 2017-12-07 DIAGNOSIS — J90 Pleural effusion, not elsewhere classified: Secondary | ICD-10-CM | POA: Diagnosis not present

## 2017-12-07 DIAGNOSIS — Z95810 Presence of automatic (implantable) cardiac defibrillator: Secondary | ICD-10-CM | POA: Insufficient documentation

## 2017-12-07 DIAGNOSIS — F1410 Cocaine abuse, uncomplicated: Secondary | ICD-10-CM | POA: Diagnosis not present

## 2017-12-07 DIAGNOSIS — J449 Chronic obstructive pulmonary disease, unspecified: Secondary | ICD-10-CM | POA: Diagnosis not present

## 2017-12-07 DIAGNOSIS — K7290 Hepatic failure, unspecified without coma: Secondary | ICD-10-CM | POA: Diagnosis not present

## 2017-12-07 DIAGNOSIS — Z951 Presence of aortocoronary bypass graft: Secondary | ICD-10-CM | POA: Insufficient documentation

## 2017-12-07 DIAGNOSIS — D509 Iron deficiency anemia, unspecified: Secondary | ICD-10-CM | POA: Diagnosis not present

## 2017-12-07 DIAGNOSIS — Z79899 Other long term (current) drug therapy: Secondary | ICD-10-CM | POA: Insufficient documentation

## 2017-12-07 DIAGNOSIS — I5043 Acute on chronic combined systolic (congestive) and diastolic (congestive) heart failure: Secondary | ICD-10-CM

## 2017-12-07 DIAGNOSIS — R188 Other ascites: Secondary | ICD-10-CM | POA: Diagnosis not present

## 2017-12-07 DIAGNOSIS — I50812 Chronic right heart failure: Secondary | ICD-10-CM

## 2017-12-07 DIAGNOSIS — E119 Type 2 diabetes mellitus without complications: Secondary | ICD-10-CM | POA: Diagnosis not present

## 2017-12-07 DIAGNOSIS — Z950 Presence of cardiac pacemaker: Secondary | ICD-10-CM | POA: Insufficient documentation

## 2017-12-07 DIAGNOSIS — I5082 Biventricular heart failure: Secondary | ICD-10-CM | POA: Diagnosis present

## 2017-12-07 DIAGNOSIS — Z9114 Patient's other noncompliance with medication regimen: Secondary | ICD-10-CM | POA: Diagnosis not present

## 2017-12-07 DIAGNOSIS — K7460 Unspecified cirrhosis of liver: Secondary | ICD-10-CM | POA: Diagnosis not present

## 2017-12-07 DIAGNOSIS — Z7901 Long term (current) use of anticoagulants: Secondary | ICD-10-CM

## 2017-12-07 DIAGNOSIS — Z794 Long term (current) use of insulin: Secondary | ICD-10-CM | POA: Insufficient documentation

## 2017-12-07 DIAGNOSIS — Z95811 Presence of heart assist device: Secondary | ICD-10-CM | POA: Diagnosis not present

## 2017-12-07 DIAGNOSIS — I480 Paroxysmal atrial fibrillation: Secondary | ICD-10-CM | POA: Diagnosis not present

## 2017-12-07 DIAGNOSIS — I110 Hypertensive heart disease with heart failure: Secondary | ICD-10-CM | POA: Diagnosis not present

## 2017-12-07 DIAGNOSIS — I2510 Atherosclerotic heart disease of native coronary artery without angina pectoris: Secondary | ICD-10-CM | POA: Insufficient documentation

## 2017-12-07 LAB — PROTIME-INR
INR: 1.45
Prothrombin Time: 17.5 seconds — ABNORMAL HIGH (ref 11.4–15.2)

## 2017-12-07 LAB — BASIC METABOLIC PANEL
ANION GAP: 15 (ref 5–15)
BUN: 41 mg/dL — ABNORMAL HIGH (ref 6–20)
CALCIUM: 9 mg/dL (ref 8.9–10.3)
CO2: 33 mmol/L — AB (ref 22–32)
Chloride: 87 mmol/L — ABNORMAL LOW (ref 101–111)
Creatinine, Ser: 1.4 mg/dL — ABNORMAL HIGH (ref 0.61–1.24)
GFR calc Af Amer: >60 mL/min (ref >60)
GFR, EST NON AFRICAN AMERICAN: 53 mL/min — AB (ref >60)
Glucose, Bld: 173 mg/dL — ABNORMAL HIGH (ref 65–99)
Potassium: 3.5 mmol/L (ref 3.5–5.1)
Sodium: 135 mmol/L (ref 135–145)

## 2017-12-07 LAB — CBC
HEMATOCRIT: 35.8 % — AB (ref 39.0–52.0)
Hemoglobin: 10.8 g/dL — ABNORMAL LOW (ref 13.0–17.0)
MCH: 25.3 pg — ABNORMAL LOW (ref 26.0–34.0)
MCHC: 30.2 g/dL (ref 30.0–36.0)
MCV: 83.8 fL (ref 78.0–100.0)
PLATELETS: 170 10*3/uL (ref 150–400)
RBC: 4.27 MIL/uL (ref 4.22–5.81)
RDW: 26.4 % — AB (ref 11.5–15.5)
WBC: 5.8 10*3/uL (ref 4.0–10.5)

## 2017-12-07 LAB — AMMONIA: AMMONIA: 49 umol/L — AB (ref 9–35)

## 2017-12-07 LAB — LACTATE DEHYDROGENASE: LDH: 153 U/L (ref 98–192)

## 2017-12-07 NOTE — Progress Notes (Signed)
Patient presents for one week follow up in [facility] today. Reports no problems with VAD equipment or concerns with drive line.  Weight is down and he is "feeling better" from fluid stand point. He has "cut back on how much I drink everyday" and is measuring his fluid intake. He also reports he added Metolazone 2.5 mg twice weekly (Mon and Fri) as instructed last visit. Abdomen large, but soft. Pt can lie flat and sleep; denies increased SOB.  States he is still having lower back spasms; Flexeril helps, but "puts me to sleep". He is taking prn Flexeril at night only. Asked if he wanted referral to Orthopedics as Dr. Haroldine Laws had offered last visit, he "does not" at this time. Informed him to call VAD clinic if this should change. Pt verbalized understanding of same.   Vital Signs:  Doppler Pressure 90  Automatc BP:  107/77 (87) HR: 93  SPO2:unable to obtain %  Weight: 204.2 lbs w/o eqt Last weight: 213.2 lbs last clinic visit Home weights: 203 lbs this AM   VAD Indication: Destination Therapy - eval complete at [facility]  VAD interrogation & Equipment Management: Speed:5800 Flow: 5.4 Power:4.5 w    PI:2.5 Hct: 35  Alarms: one "no external power alarm" - pt reports he accidentally pulled MPU out of wall socket Events: 0 - 5 daily  Fixed speed 5800 Low speed limit: 5500  Primary Controller:  Replace back up battery in 23 months. Back up controller:   Pt left black bag in car  Annual Equipment Maintenance on UBC/PM was performed on 09/2017.   I reviewed the LVAD parameters from today and compared the results to the patient's prior recorded data. LVAD interrogation was NEGATIVE for significant power changes, NEGATIVE for clinical alarms and STABLE for PI events/speed drops. No programming changes were made and pump is functioning within specified parameters. Pt is performing daily controller and system monitor self tests along with completing weekly and monthly maintenance for  LVAD equipment.  LVAD equipment check completed and is in good working order. Back-up equipment not present today. Reminded patient he should keep black bag with him at all times.    Exit Site Care: Drive line is being maintained weekly  by VAD Coordinators. Drive line exit site well healed and incorporated. The velour is fully implanted at exit site. Dressing dry and intact; anchor not in place, pt has taped driveline to skin. He reports anchor caused "itching" after last dressing change and he had to remove. Pt reports he is sensitive to skin prep and asked that none be used with his dressing changes.  Existing VAD dressing removed and site care performed using sterile technique. Drive line exit site cleaned with Betadine swabs x 2, allowed to dry, and Sorbaview dressing with bio patch re-applied. Exit site healed and incorporated, the velour is fully implanted at exit site. No redness, tenderness, drainage, foul odor or rash noted. Drive line anchor re-applied. Pt denies fever or chills.   Significant Events on VAD Support:  11/30/16> Milrinone gtt at discharge 01/27/17>dobutamine at 5 mcg/kg/mingtt at discharge 03/08/17> RV failure 04/29/17> symptomatic anemia and melena. EGD revealed stomach ulcers. Carafate added; ASA stopped 05/31/17>Ongoing symptomatic anemia and melena; capsule study mild antral gastritis; poss tiny AVM right colon. Lower MAPS, Losartan cut back. 06/2017> admit with syncope, AMS, Dobutamine at 7.50mcg 11/09/17>Marked ascites, RV failure, and hepatic encephalopathy. Paracentesis on 2/7 and 2/12 with >11 liters fluid removed   Device:Protect Therapies: off; end of service 08/2016 Last  check: complete interrogation 05/26/2017. DDD (lower rate 60); BiV pacing with 96% AS-VP  BP & Labs:  MAP 90 - Doppler is reflecting MAP  Hgb 10.8 - No S/S of bleeding. Specifically denies melena/BRBPR or nosebleeds.  LDH stable at 153 with established baseline of 150- 250. Denies  tea-colored urine. No power elevations noted on interrogation.   Ammonia 49 - remains elevated, but down from last week (52). Pt reports he is "taking a swig" of Lactulose twice daily. He is not having any diarrhea. No confusion noted at today's clinic visit.   Patient Instructions: 1. Continue Metolazone 2.5 mg every Mon/Fri 2. Continue Lactulose 45 ml twice daily 3. No change in meds 4. Jacob Moores will call you with INR results and coumadin dosing 5. Return in one week for dressing change and INR 6. Return to [place] clinic in one month    Zada Girt RN [place] Coordinator   Office: [phone] 24/7 Emergency [place] Pager: [phone]

## 2017-12-07 NOTE — Progress Notes (Signed)
Advanced Heart Failure Clinic Note    Referring Provider: Darylene Price, FNP Primary Care: [facility] Primary Cardiologist: Dr Haroldine Laws.  Transplant Center- [facility]  HPI: Ronald Miller. is a 62 y.o. male with history of chronic systolic HF s/p Medtronic ICD 2014, CAD s/p CABG in 2000, HTN, Hx of cocaine abuse, Tobacco abuse, Depression, PAF, and COPD. HM3 placed 09/09/2016. In April 2018 he was placed on milrinone but later switched to dobutamine. He remains on dobutamine 7.82mcg.   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  06/01/2017- Capsule Endoscopy- normal except  tiny angiectasia, nonbleeding in the right colon.  Admitted 2/6-2/18/2019 with recurrent volume overload, RHF and ascites despite dobutamine support. Underwent paracentesis on 2/7 and 2/12. He had over 11 liters removed from paracentesis. Diuresed with IV lasix and transitioned back to home torsemide 100 mg/50mg  daily. Overall he lost 47 pounds.  Also treated for hepatic encephalopathy. MAPs were soft with diuresis so midodrine increased to 10 tid. Weight on d/c 195 pounds.   Returns for LVAD follow up. Remains on dobuatmine 7.5 for RV support and midodrine for BP support. At last visit weight was going back up again and was markedly volume overloaded with LE edema and ascites.  Metolazone added and now taking Monday and Friday. Also much more careful with fluid intake. Says "I cut way back". Breathing better. Belly still distended but not as bad. No orthopnea or PND. LE edema improved. Denies orthopnea or PND. No fevers, chills or problems with driveline. No bleeding, melena or neuro symptoms. No VAD alarms. Taking all meds as prescribed.    VAD Indication: Destination Therapy - eval complete at [facility]  VAD interrogation & Equipment Management: Speed:5800 Flow: 5.4 Power:4.5 w PI:2.5 Hct: 35  Alarms: one "no external power alarm" -  pt reports he accidentally pulled MPU out of wall socket Events: 0 - 5 daily  Fixed speed 5800 Low speed limit: 5500  Primary Controller: Replace back up battery in 23 months. Back up controller:  Pt left black bag in car  Annual Equipment Maintenance on UBC/PM was performed on 09/2017.    Past Medical History:  Diagnosis Date  . AICD (automatic cardioverter/defibrillator) present   . ASCVD (arteriosclerotic cardiovascular disease)   . Chronic systolic CHF (congestive heart failure) ([place])   . COPD (chronic obstructive pulmonary disease) ([place])   . Coronary artery disease   . Depression   . Diabetes mellitus   . GI bleed   . History of cocaine abuse   . Hypertension   . Presence of permanent cardiac pacemaker   . Shortness of breath dyspnea   . Suicidal ideation   . Tobacco abuse     Current Outpatient Medications  Medication Sig Dispense Refill  . acetaminophen (TYLENOL) 500 MG tablet Take 1,000 mg by mouth every 6 (six) hours as needed for moderate pain or headache.    Marland Kitchen amiodarone (PACERONE) 200 MG tablet TAKE ONE TABLET BY MOUTH ONCE DAILY (Patient taking differently: TAKE ONE TABLET (200mg ) BY MOUTH ONCE DAILY) 60 tablet 6  . citalopram (CELEXA) 20 MG tablet Take 1 tablet (20 mg total) by mouth daily. 30 tablet 6  . cyclobenzaprine (FLEXERIL) 10 MG tablet Take 1 tablet (10 mg total) by mouth 3 (three) times daily as needed for muscle spasms. 30 tablet 1  . digoxin (DIGOX) 0.125 MG tablet Take 1 tablet (0.125 mg total) daily by mouth. [place]  tablet 6  . DOBUTamine (DOBUTREX) 4-5 MG/ML-% infusion Inject 714.75 mcg/min into the vein continuous. PER [facility] pharmacy 250 mL 11  . docusate sodium (COLACE) 100 MG capsule Take 200 mg by mouth at bedtime.     . gabapentin (NEURONTIN) 300 MG capsule Take 1 capsule (300 mg total) by mouth 2 (two) times daily. 180 capsule 6  . insulin aspart (NOVOLOG FLEXPEN) 100 UNIT/ML FlexPen Inject 5 Units into the skin 3 (three) times daily with  meals. 15 mL 11  . Insulin Glargine (LANTUS SOLOSTAR) 100 UNIT/ML Solostar Pen Inject 10 Units into the skin daily at 10 pm. 1 pen 6  . lactulose (CHRONULAC) 10 GM/15ML solution Take 45 mLs (30 g total) by mouth 2 (two) times daily. 240 mL 6  . magnesium oxide (MAG-OX) 400 (241.3 Mg) MG tablet Take 1 tablet (400 mg total) by mouth daily. 30 tablet 6  . midodrine (PROAMATINE) 10 MG tablet Take 1 tablet (10 mg total) by mouth 3 (three) times daily with meals. 90 tablet 6  . Multiple Vitamin (MULTIVITAMIN WITH MINERALS) TABS tablet Take 1 tablet by mouth daily.    . ondansetron (ZOFRAN ODT) 4 MG disintegrating tablet Take 1 tablet (4 mg total) by mouth every 8 (eight) hours as needed for nausea. 6 tablet 0  . pantoprazole (PROTONIX) 40 MG tablet Take 1 tablet (40 mg total) by mouth 2 (two) times daily before a meal. 60 tablet 6  . potassium chloride SA (K-DUR,KLOR-CON) 20 MEQ tablet Take 1 tablet (20 mEq total) by mouth 2 (two) times daily. 60 tablet 3  . senna-docusate (SENOKOT S) 8.6-50 MG tablet Take 2 tablets by mouth at bedtime as needed for mild constipation. 30 tablet 6  . spironolactone (ALDACTONE) 25 MG tablet Take 0.5 tablets (12.5 mg total) by mouth daily. 15 tablet 6  . sucralfate (CARAFATE) 1 GM/10ML suspension Take 10 mLs (1 g total) by mouth 4 (four) times daily -  with meals and at bedtime. 420 mL 6  . torsemide (DEMADEX) 100 MG tablet Take 100mg  in the morning and 50mg  in the evening 90 tablet 6  . warfarin (COUMADIN) 5 MG tablet Take 1 and 1/2 tablets (7.5 mg) daily except 1 tablet (5 mg) on Monday and Friday    . zolpidem (AMBIEN) 10 MG tablet Take 1 tablet (10 mg total) by mouth at bedtime as needed for sleep. (Patient not taking: Reported on 12/07/2017) 30 tablet 2   No current facility-administered medications for this encounter.     Chlorhexidine gluconate; Codeine; Trazodone and nefazodone; Lipitor [atorvastatin]; and Tape  REVIEW OF SYSTEMS: All systems negative except as  listed in HPI, PMH and Problem list.     Vitals:   12/07/17 1206 12/07/17 1227  BP: 107/77 (!) 90/0  Pulse: 93     Wt Readings from Last 3 Encounters:  12/07/17 204 lb 3.2 oz (92.6 kg)  11/30/17 213 lb 3.2 oz (96.7 kg)  11/21/17 195 lb 5.2 oz (88.6 kg)    Vital Signs:  Doppler Pressure 90    Automatc BP:  107/77 (87) HR: 93 SPO2:unable to obtain %  Weight: 204.2 lbs w/o eqt Last weight: 213.2 lbs last clinic visit Home weights: 203 lbs this AM   Physical exam: General:  Walked into clinic without difficulty NAD.  HEENT: normal  Neck: supple. JVP 10  Carotids 2+ bilat; no bruits. No lymphadenopathy or thryomegaly appreciated. Cor: LVAD hum. HM-3 sounds Lungs: Clear. Abdomen: obese soft, nontender, + moderately distended. No  hepatosplenomegaly. No bruits or masses. Good bowel sounds. Driveline site clean. Anchor in place.  Extremities: no cyanosis, clubbing, rash. Warm  Trace to 1+ edema Neuro: alert & oriented x 3. No focal deficits. Moves all 4 without problem no asterixis  ASSESSMENT AND PLAN: 1. Chronic end-stage biventricular systolic HF: LVEF 35% due to ICM -> s/p Echo 08/24/16 LVEF 15%, RV mild dilated, moderately reduced. 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  Has been turned down for transplant at [facility].  - Complicated by RV failure. Failed milrinone due to persistent RV failure/shock..   - Remains on dobutamine 7.5 mcg.  - Improved today NYHA III  - Recently admitted for severe volume overload and ascites. 47 pounds removed. At last visit weight back up but metolazone added and he has cut back on fluids. Now looks better. Continue current plan. Need to watch closely. Goal weight [phone] - Continue torsemide 100/50 and metolazone 2.5 every Monday and Friday as needed - Midodrine recently increased to 10 TID for low MAPs and cirrhotic physiology. Will continue. BP ok.  - Renal function stable. Creatinine 1.40 today. Supp K - Off sildenafil due to hypotension - No beta  blocker with RV failure.  - VAD interrogated personally. Parameters stable. - Warfarin with goal INR 2.0 - 2.5 + ASA 81 daily. INR 1.45  Discussed dosing with PharmD personally. - Driveline ok - [id number] - Not transplant candidate due to social issues, noncompliance and cirrhosis (has been turned down by [facility]) 2. CAD: Severe 3v- CAD s/p CABG with occluded grafts as above except for LIMA.  -No s/s ichemia. Continue current therapy.  3. DMII:  - Per PCP - HgbA1c has improved 4. Tobacco abuse:  - Reports complete cessation 5. Atrial fibrillation, paroxysmal - Remains in NSR  Continue amio 200 daily. Careful with cirrhosis  6. Left pleural effusion - underwent repeat thoracentesis in Jan. 2018.  - no recurrence. No effusion on exam today  7.  Anxiety/Depression - Continue Celexa 20mg  daily . - Social issues still a concern  8. Cirrhosis with hepatic encephalopathy - likely combination ETOH and chronic RV failure - No asterixis today - Continue lactulose - Does have some ascites on exam. Can repeatt paracentesis as needed. 10. Anemia, iron-deficiency:   - Has had GI work up with EGD and Capsule Endoscopy showing severe gastropathy.Today hgb is up at 10.8. Continue carafate.  Total time spent 45 minutes. Over half that time spent discussing above.   Glori Bickers  MD  2:02 PM

## 2017-12-07 NOTE — Patient Instructions (Addendum)
1. Continue Metolazone 2.5 mg every Mon/Fri 2. Continue Lactulose 45 ml twice daily 3. No change in meds 4. Ronald Miller will call you with INR results and coumadin dosing 5. Return in one week for dressing change and INR 6. Return to [place] clinic in one month

## 2017-12-12 ENCOUNTER — Ambulatory Visit (HOSPITAL_COMMUNITY): Payer: Self-pay | Admitting: Pharmacist

## 2017-12-12 ENCOUNTER — Other Ambulatory Visit (HOSPITAL_COMMUNITY): Payer: Self-pay | Admitting: *Deleted

## 2017-12-12 ENCOUNTER — Ambulatory Visit (HOSPITAL_COMMUNITY)
Admission: RE | Admit: 2017-12-12 | Discharge: 2017-12-12 | Disposition: A | Discharge status: Home / Self Care | Payer: Medicare HMO | Source: Ambulatory Visit | Attending: Cardiology | Admitting: Cardiology

## 2017-12-12 DIAGNOSIS — Z95811 Presence of heart assist device: Secondary | ICD-10-CM | POA: Insufficient documentation

## 2017-12-12 LAB — PROTIME-INR
INR: 1.7
Prothrombin Time: 19.8 seconds — ABNORMAL HIGH (ref 11.4–15.2)

## 2017-12-13 LAB — FUNGUS CULTURE WITH STAIN

## 2017-12-13 LAB — FUNGUS CULTURE RESULT

## 2017-12-13 LAB — FUNGAL ORGANISM REFLEX

## 2017-12-14 ENCOUNTER — Encounter (HOSPITAL_COMMUNITY): Payer: Self-pay

## 2017-12-16 ENCOUNTER — Other Ambulatory Visit (HOSPITAL_COMMUNITY): Payer: Self-pay | Admitting: Internal Medicine

## 2017-12-16 ENCOUNTER — Other Ambulatory Visit (HOSPITAL_COMMUNITY): Payer: Self-pay | Admitting: Unknown Physician Specialty

## 2017-12-16 DIAGNOSIS — Z7901 Long term (current) use of anticoagulants: Secondary | ICD-10-CM

## 2017-12-16 DIAGNOSIS — Z95811 Presence of heart assist device: Secondary | ICD-10-CM

## 2017-12-20 ENCOUNTER — Ambulatory Visit (HOSPITAL_COMMUNITY)
Admission: RE | Admit: 2017-12-20 | Discharge: 2017-12-20 | Disposition: A | Discharge status: Home / Self Care | Payer: Medicare HMO | Source: Ambulatory Visit | Attending: Cardiology | Admitting: Cardiology

## 2017-12-20 ENCOUNTER — Ambulatory Visit (HOSPITAL_COMMUNITY): Payer: Self-pay | Admitting: Pharmacist

## 2017-12-20 DIAGNOSIS — E876 Hypokalemia: Secondary | ICD-10-CM

## 2017-12-20 DIAGNOSIS — Z7901 Long term (current) use of anticoagulants: Secondary | ICD-10-CM | POA: Insufficient documentation

## 2017-12-20 DIAGNOSIS — Z95811 Presence of heart assist device: Secondary | ICD-10-CM | POA: Insufficient documentation

## 2017-12-20 DIAGNOSIS — F5101 Primary insomnia: Secondary | ICD-10-CM

## 2017-12-20 LAB — BASIC METABOLIC PANEL
ANION GAP: 13 (ref 5–15)
BUN: 42 mg/dL — ABNORMAL HIGH (ref 6–20)
CHLORIDE: 87 mmol/L — AB (ref 101–111)
CO2: 32 mmol/L (ref 22–32)
Calcium: 9.1 mg/dL (ref 8.9–10.3)
Creatinine, Ser: 1.23 mg/dL (ref 0.61–1.24)
GFR calc non Af Amer: >60 mL/min (ref >60)
Glucose, Bld: 157 mg/dL — ABNORMAL HIGH (ref 65–99)
Potassium: 3.5 mmol/L (ref 3.5–5.1)
Sodium: 132 mmol/L — ABNORMAL LOW (ref 135–145)

## 2017-12-20 LAB — PROTIME-INR
INR: 2.16
Prothrombin Time: 23.9 seconds — ABNORMAL HIGH (ref 11.4–15.2)

## 2017-12-20 MED ORDER — POTASSIUM CHLORIDE CRYS ER 20 MEQ PO TBCR: EXTENDED_RELEASE_TABLET | ORAL | 3 refills | Status: DC

## 2017-12-20 MED ORDER — TRAZODONE HCL 50 MG PO TABS: 50.0000 mg | ORAL_TABLET | Freq: Every evening | ORAL | 6 refills | Status: AC | PRN

## 2017-12-20 NOTE — Patient Instructions (Signed)
1. No changes in coumadin dose.  2. Stop Ambien 3. Start Trazadone 50 mg each evening for sleep as needed.  4. Increase potassium to 40 meq in am and 20 meq in pm. 5. Return in one week for dressing change and labs.

## 2017-12-20 NOTE — Addendum Note (Signed)
Encounter addended by: Lezlie Octave, RN on: 12/20/2017 3:09 PM  Actions taken: Order Reconciliation Section accessed, Home Medications modified, Sign clinical note, Medication long-term status modified, Visit diagnoses modified, Pharmacy for encounter modified, Order list changed, Diagnosis association updated, Allergies reviewed, Allergies modified

## 2017-12-20 NOTE — Addendum Note (Signed)
Encounter addended by: Lezlie Octave, RN on: 12/20/2017 4:11 PM  Actions taken: Sign clinical note

## 2017-12-20 NOTE — Progress Notes (Signed)
Patient presents for one dressing change with BMP check today. Pt started on Metozalone 2.5 mg Mon/Fri each week, need to check K+ level. Reports no problems with VAD equipment or concerns with drive line.  Pt c/o "not sleeping"; "too anxious", can't stop thinking about everything and admits to being "scared to death" of having pancreatitis. He is fearful he will not be able to continue living alone and providing for himself. He is very appreciative of the help received from Judson Roch [facility] Nurse) and Heart Failure team. Support offered.   He does not take Ambien for sleep - makes him feel "drowsy all the next day". Will dc and start Trazadone prn for sleep per Oda Kilts, PA.   Based on BMP results today - will increase potassium per Oda Kilts, PA.   Contacted Sarah [facility]) nurse with above changes in meds - she verbalized understanding of same. AVS sent with patient to review with Sarah as well.   Vital Signs:  Doppler Pressure  84 Automatc BP:  102/79(88) HR:  62 SPO2: 96 %  Weight: 209 lbs w/o eqt Last weight: 204 lbs last clinic visit  Exit Site Care: Drive line is being maintained weekly  by VAD Coordinators. Drive line exit site well healed and incorporated. The velour is fully implanted at exit site. Dressing dry and intact; anchor not in place, pt has taped driveline to skin. He reports anchor "fell off". Existing VAD dressing removed and site care performed using sterile technique. Drive line exit site cleaned with Betadine swabs x 2, rinsed with sterile saline, allowed to dry, and Sorbaview dressing with bio patch re-applied. Exit site healed and incorporated, the velour is fully implanted at exit site. No redness, tenderness, drainage, foul odor or rash noted. Unagrip anchors x 2 applied for added support.  Pt denies fever or chills.    Patient Instructions: 1. No changes in coumadin dose.  2. Stop Ambien 3. Start Trazadone 50 mg each evening for sleep as needed.  4. Increase  potassium to 40 meq in am and 20 meq in pm. 5. Return in one week for dressing change and labs.   Zada Girt RN [place] Coordinator   Office: [phone] 24/7 Emergency VAD Pager: [phone]

## 2017-12-22 IMAGING — XA IR FLUORO GUIDE CV LINE*R*
1 series · 1 of 1 positions shown · non-contrast
Comparison: none

CLINICAL DATA: Right arm PICC line has partially retracted from
exit site. PICC line needed for continuous IV milrinone therapy.

[Series 1: fl (-) angio · 1 of 1 slices shown]
[im 1/1]
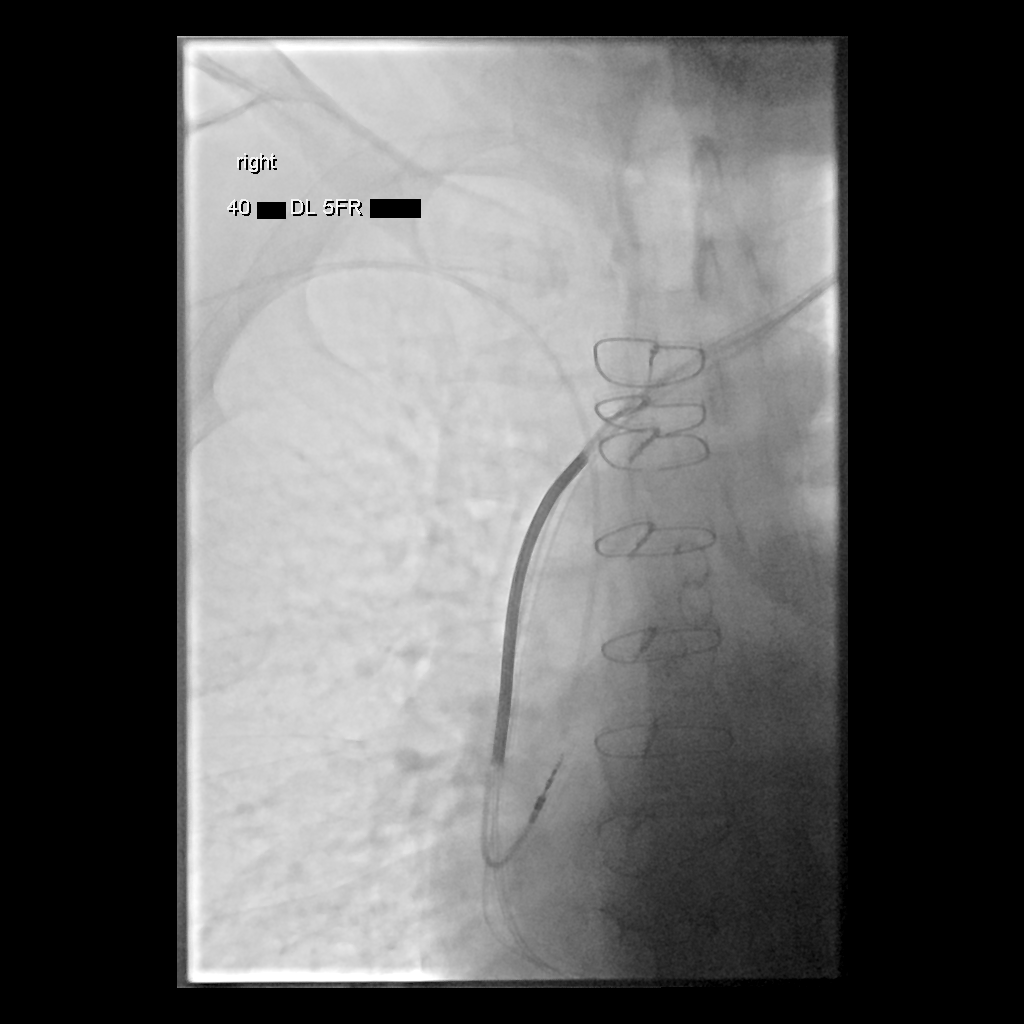

[1 of 1 positions shown; findings below may reference images not displayed]

EXAM:
PICC LINE EXCHANGE UNDER FLUOROSCOPIC GUIDANCE

FLUOROSCOPY TIME:  36 seconds.  3 mGy.

PROCEDURE:
The patient was advised of the possible risks and complications and
agreed to undergo the procedure. The patient was then brought to the
angiographic suite for the procedure.

The right arm was prepped with chlorhexidine, draped in the usual
sterile fashion using maximum barrier technique (cap and mask,
sterile gown, sterile gloves, large sterile sheet, hand hygiene and
cutaneous antisepsis) and infiltrated locally with 1% Lidocaine.

Pre-existing PICC line was cut and removed over a guidewire. A
peel-away sheath was placed. A [DATE] French dual lumen power injected
PICC was advanced to the lower SVC/right atrial junction.
Fluoroscopy during the procedure and fluoro spot radiograph confirms
appropriate catheter position. The catheter was secured at the skin
with Prolene retention sutures. The catheter was flushed and covered
with a sterile dressing.

Catheter length: 40 cm

COMPLICATIONS:
None
IMPRESSION: Successful right arm power injectable PICC line replacement under
fluoroscopic guidance. The catheter is ready for use.

## 2017-12-23 ENCOUNTER — Other Ambulatory Visit (HOSPITAL_COMMUNITY): Payer: Self-pay | Admitting: Internal Medicine

## 2017-12-23 ENCOUNTER — Other Ambulatory Visit (HOSPITAL_COMMUNITY): Payer: Self-pay | Admitting: Unknown Physician Specialty

## 2017-12-23 DIAGNOSIS — Z7901 Long term (current) use of anticoagulants: Secondary | ICD-10-CM

## 2017-12-23 DIAGNOSIS — Z95811 Presence of heart assist device: Secondary | ICD-10-CM

## 2017-12-27 ENCOUNTER — Encounter (HOSPITAL_COMMUNITY): Payer: Self-pay | Admitting: Student

## 2017-12-27 ENCOUNTER — Ambulatory Visit (HOSPITAL_COMMUNITY): Payer: Self-pay | Admitting: Pharmacist

## 2017-12-27 ENCOUNTER — Other Ambulatory Visit (HOSPITAL_COMMUNITY): Payer: Self-pay | Admitting: *Deleted

## 2017-12-27 ENCOUNTER — Ambulatory Visit (HOSPITAL_COMMUNITY)
Admission: RE | Admit: 2017-12-27 | Discharge: 2017-12-27 | Disposition: A | Discharge status: Home / Self Care | Payer: Medicare HMO | Source: Ambulatory Visit | Attending: Cardiology | Admitting: Cardiology

## 2017-12-27 ENCOUNTER — Ambulatory Visit (HOSPITAL_COMMUNITY)
Admission: RE | Admit: 2017-12-27 | Discharge: 2017-12-27 | Disposition: A | Discharge status: Home / Self Care | Payer: Medicare HMO | Source: Ambulatory Visit | Attending: Internal Medicine | Admitting: Internal Medicine

## 2017-12-27 DIAGNOSIS — Z95811 Presence of heart assist device: Secondary | ICD-10-CM

## 2017-12-27 DIAGNOSIS — K7460 Unspecified cirrhosis of liver: Secondary | ICD-10-CM | POA: Insufficient documentation

## 2017-12-27 DIAGNOSIS — R188 Other ascites: Secondary | ICD-10-CM | POA: Diagnosis not present

## 2017-12-27 DIAGNOSIS — Z7901 Long term (current) use of anticoagulants: Secondary | ICD-10-CM | POA: Insufficient documentation

## 2017-12-27 DIAGNOSIS — I50812 Chronic right heart failure: Secondary | ICD-10-CM | POA: Insufficient documentation

## 2017-12-27 DIAGNOSIS — Z4800 Encounter for change or removal of nonsurgical wound dressing: Secondary | ICD-10-CM | POA: Diagnosis not present

## 2017-12-27 DIAGNOSIS — Z79811 Long term (current) use of aromatase inhibitors: Secondary | ICD-10-CM | POA: Diagnosis present

## 2017-12-27 HISTORY — PX: IR PARACENTESIS: IMG2679

## 2017-12-27 LAB — PROTIME-INR
INR: 2.63
Prothrombin Time: 27.9 seconds — ABNORMAL HIGH (ref 11.4–15.2)

## 2017-12-27 MED ORDER — LIDOCAINE HCL (PF) 1 % IJ SOLN
INTRAMUSCULAR | Status: DC | PRN
  Administered 2017-12-27: 5 mL

## 2017-12-27 MED ORDER — LIDOCAINE HCL (PF) 2 % IJ SOLN
INTRAMUSCULAR | Status: AC
  Filled 2017-12-27: qty 20

## 2017-12-27 NOTE — Procedures (Signed)
PROCEDURE SUMMARY:  Successful US guided paracentesis from right lateral abdomen.  Yielded 2.8 liters of yellow fluid.  No immediate complications.  Pt tolerated well.   Specimen was not sent for labs.  Docia Barrier PA-C 12/27/2017 3:47 PM

## 2017-12-28 ENCOUNTER — Encounter (HOSPITAL_COMMUNITY): Payer: Self-pay

## 2017-12-28 NOTE — Progress Notes (Signed)
Patient presents for one week dressing change today. Reports no problems with VAD equipment or concerns with drive line.  Pt states that he feels like his abdomen is getting full again. He reports having trouble walking and feels a lot of pressure in his abdomen. Pt denies increased SOB, or any other symptoms. We will get pt in at IR today for a paracentesis.   Weight: 215 lbs w/o eqt Last weight: 209 lbs last clinic visit  Exit Site Care: Drive line is being maintained weekly  by VAD Coordinators. Drive line exit site well healed and incorporated. The velour is fully implanted at exit site. Dressing dry and intact. Anchor intact. Existing VAD dressing removed and site care performed using sterile technique. Drive line exit site cleaned with Betadine swabs x 2, rinsed with sterile saline, allowed to dry, and Sorbaview dressing with bio patch re-applied. Exit site healed and incorporated, the velour is fully implanted at exit site. No redness, tenderness, drainage, foul odor or rash noted. Unagrip anchors x 2 applied for added support.  Pt denies fever or chills.   Pt taken over to Radiology via [facility] for paracentesis.  Return in one week for dressing change, INR and Ammonia.   Tanda Rockers RN [place] Coordinator   Office: [phone] 24/7 Emergency VAD Pager: [phone]

## 2017-12-29 ENCOUNTER — Telehealth (HOSPITAL_COMMUNITY): Payer: Self-pay | Admitting: Cardiology

## 2017-12-29 DIAGNOSIS — F5101 Primary insomnia: Secondary | ICD-10-CM

## 2017-12-29 DIAGNOSIS — E876 Hypokalemia: Secondary | ICD-10-CM

## 2017-12-29 NOTE — Telephone Encounter (Signed)
Abnormal labs received from [facility] k 3.3 Cr 1.11 Bun 49 NA 135  Per vo Amy Clegg, NP Increase potassium to 40 mew BID   LMOM for patient

## 2017-12-30 MED ORDER — POTASSIUM CHLORIDE CRYS ER 20 MEQ PO TBCR: 40.0000 meq | EXTENDED_RELEASE_TABLET | Freq: Two times a day (BID) | ORAL | 3 refills | Status: DC

## 2017-12-30 NOTE — Telephone Encounter (Signed)
Pt aware and voiced understanding 

## 2018-01-01 ENCOUNTER — Other Ambulatory Visit (HOSPITAL_COMMUNITY): Payer: Self-pay | Admitting: Adult Health

## 2018-01-02 ENCOUNTER — Other Ambulatory Visit (HOSPITAL_COMMUNITY): Payer: Self-pay | Admitting: *Deleted

## 2018-01-02 ENCOUNTER — Other Ambulatory Visit (HOSPITAL_COMMUNITY): Payer: Self-pay | Admitting: Internal Medicine

## 2018-01-02 DIAGNOSIS — R188 Other ascites: Secondary | ICD-10-CM

## 2018-01-02 DIAGNOSIS — K7460 Unspecified cirrhosis of liver: Secondary | ICD-10-CM

## 2018-01-02 DIAGNOSIS — Z7901 Long term (current) use of anticoagulants: Secondary | ICD-10-CM

## 2018-01-02 DIAGNOSIS — Z95811 Presence of heart assist device: Secondary | ICD-10-CM

## 2018-01-03 ENCOUNTER — Other Ambulatory Visit (HOSPITAL_COMMUNITY): Payer: Self-pay

## 2018-01-04 ENCOUNTER — Other Ambulatory Visit (HOSPITAL_COMMUNITY): Payer: Self-pay

## 2018-01-05 ENCOUNTER — Ambulatory Visit (HOSPITAL_COMMUNITY): Payer: Self-pay | Admitting: Pharmacist

## 2018-01-05 ENCOUNTER — Ambulatory Visit (HOSPITAL_COMMUNITY)
Admission: RE | Admit: 2018-01-05 | Discharge: 2018-01-05 | Disposition: A | Discharge status: Home / Self Care | Payer: Medicare HMO | Source: Ambulatory Visit | Attending: Cardiology | Admitting: Cardiology

## 2018-01-05 DIAGNOSIS — Z95811 Presence of heart assist device: Secondary | ICD-10-CM | POA: Insufficient documentation

## 2018-01-05 DIAGNOSIS — Z7901 Long term (current) use of anticoagulants: Secondary | ICD-10-CM | POA: Diagnosis present

## 2018-01-05 DIAGNOSIS — K7460 Unspecified cirrhosis of liver: Secondary | ICD-10-CM | POA: Insufficient documentation

## 2018-01-05 DIAGNOSIS — R188 Other ascites: Secondary | ICD-10-CM | POA: Diagnosis not present

## 2018-01-05 LAB — PROTIME-INR
INR: 2.4
Prothrombin Time: 26 seconds — ABNORMAL HIGH (ref 11.4–15.2)

## 2018-01-05 LAB — AMMONIA: AMMONIA: 82 umol/L — AB (ref 9–35)

## 2018-01-05 NOTE — Addendum Note (Signed)
Encounter addended by: Lezlie Octave, RN on: 01/05/2018 12:29 PM  Actions taken: Sign clinical note

## 2018-01-05 NOTE — Progress Notes (Signed)
Patient presents for one week dressing change today. Reports no problems with VAD equipment or concerns with drive line.   Exit Site Care: Drive line is being maintained weekly  by VAD Coordinators. Drive line exit site well healed and incorporated. The velour is fully implanted at exit site. Dressing dry and intact. Anchor intact. Existing VAD dressing removed and site care performed using sterile technique. Drive line exit site cleaned with Betadine swabs x 2, rinsed with sterile saline, allowed to dry, and Sorbaview dressing with bio patch re-applied. Exit site healed and incorporated, the velour is fully implanted at exit site. No redness, tenderness, drainage, foul odor or rash noted. Anchor re-applied.  Pt denies fever or chills.   Return in one week for VAD clinic visit.   Zada Girt RN [place] Coordinator   Office: [phone] 24/7 Emergency VAD Pager: [phone]

## 2018-01-06 ENCOUNTER — Telehealth (HOSPITAL_COMMUNITY): Payer: Self-pay | Admitting: *Deleted

## 2018-01-06 NOTE — Telephone Encounter (Signed)
Called pt and also left message for Judson Roch ([place] nurse) per Dr. Haroldine Laws. Pt's ammonia level up yesterday to 82; pt only taking a "swig" of lactulose twice daily. [facility] nurse has been trying to get him to increase to 45 mg twice daily as instructed; reinforced need to patient to take 45 ml twice daily. Pt verbalized understanding and agreement to same.

## 2018-01-10 ENCOUNTER — Ambulatory Visit (HOSPITAL_COMMUNITY): Payer: Self-pay | Admitting: Pharmacist

## 2018-01-10 ENCOUNTER — Ambulatory Visit (HOSPITAL_COMMUNITY)
Admission: RE | Admit: 2018-01-10 | Discharge: 2018-01-10 | Disposition: A | Discharge status: Home / Self Care | Payer: Medicare HMO | Source: Ambulatory Visit | Attending: Cardiology | Admitting: Cardiology

## 2018-01-10 ENCOUNTER — Other Ambulatory Visit: Payer: Self-pay

## 2018-01-10 ENCOUNTER — Encounter (HOSPITAL_COMMUNITY): Payer: Self-pay

## 2018-01-10 ENCOUNTER — Encounter (HOSPITAL_COMMUNITY): Payer: Self-pay | Admitting: General Practice

## 2018-01-10 ENCOUNTER — Inpatient Hospital Stay (HOSPITAL_COMMUNITY)
Admission: AD | Admit: 2018-01-10 | Discharge: 2018-01-27 | DRG: 292 | Disposition: A | Discharge status: Skilled Nursing Facility | Payer: Medicare HMO | Source: Ambulatory Visit | Attending: Internal Medicine | Admitting: Internal Medicine

## 2018-01-10 VITALS — BP 95/63

## 2018-01-10 DIAGNOSIS — K761 Chronic passive congestion of liver: Secondary | ICD-10-CM | POA: Diagnosis present

## 2018-01-10 DIAGNOSIS — I5082 Biventricular heart failure: Secondary | ICD-10-CM | POA: Diagnosis present

## 2018-01-10 DIAGNOSIS — I5023 Acute on chronic systolic (congestive) heart failure: Secondary | ICD-10-CM | POA: Diagnosis not present

## 2018-01-10 DIAGNOSIS — E119 Type 2 diabetes mellitus without complications: Secondary | ICD-10-CM | POA: Diagnosis present

## 2018-01-10 DIAGNOSIS — I5084 End stage heart failure: Secondary | ICD-10-CM | POA: Diagnosis present

## 2018-01-10 DIAGNOSIS — I5022 Chronic systolic (congestive) heart failure: Secondary | ICD-10-CM | POA: Insufficient documentation

## 2018-01-10 DIAGNOSIS — D509 Iron deficiency anemia, unspecified: Secondary | ICD-10-CM | POA: Diagnosis present

## 2018-01-10 DIAGNOSIS — R42 Dizziness and giddiness: Secondary | ICD-10-CM | POA: Diagnosis not present

## 2018-01-10 DIAGNOSIS — Z951 Presence of aortocoronary bypass graft: Secondary | ICD-10-CM | POA: Diagnosis not present

## 2018-01-10 DIAGNOSIS — Z95810 Presence of automatic (implantable) cardiac defibrillator: Secondary | ICD-10-CM | POA: Diagnosis not present

## 2018-01-10 DIAGNOSIS — Z8249 Family history of ischemic heart disease and other diseases of the circulatory system: Secondary | ICD-10-CM

## 2018-01-10 DIAGNOSIS — Z87891 Personal history of nicotine dependence: Secondary | ICD-10-CM

## 2018-01-10 DIAGNOSIS — R188 Other ascites: Secondary | ICD-10-CM | POA: Diagnosis not present

## 2018-01-10 DIAGNOSIS — Z95811 Presence of heart assist device: Secondary | ICD-10-CM | POA: Diagnosis not present

## 2018-01-10 DIAGNOSIS — K7290 Hepatic failure, unspecified without coma: Secondary | ICD-10-CM | POA: Diagnosis present

## 2018-01-10 DIAGNOSIS — Z7901 Long term (current) use of anticoagulants: Secondary | ICD-10-CM

## 2018-01-10 DIAGNOSIS — E876 Hypokalemia: Secondary | ICD-10-CM | POA: Diagnosis present

## 2018-01-10 DIAGNOSIS — Z86718 Personal history of other venous thrombosis and embolism: Secondary | ICD-10-CM

## 2018-01-10 DIAGNOSIS — F419 Anxiety disorder, unspecified: Secondary | ICD-10-CM | POA: Diagnosis present

## 2018-01-10 DIAGNOSIS — N179 Acute kidney failure, unspecified: Secondary | ICD-10-CM | POA: Diagnosis not present

## 2018-01-10 DIAGNOSIS — I2510 Atherosclerotic heart disease of native coronary artery without angina pectoris: Secondary | ICD-10-CM | POA: Diagnosis present

## 2018-01-10 DIAGNOSIS — E871 Hypo-osmolality and hyponatremia: Secondary | ICD-10-CM | POA: Diagnosis present

## 2018-01-10 DIAGNOSIS — F329 Major depressive disorder, single episode, unspecified: Secondary | ICD-10-CM | POA: Diagnosis present

## 2018-01-10 DIAGNOSIS — I480 Paroxysmal atrial fibrillation: Secondary | ICD-10-CM | POA: Diagnosis present

## 2018-01-10 DIAGNOSIS — Z79899 Other long term (current) drug therapy: Secondary | ICD-10-CM

## 2018-01-10 DIAGNOSIS — I5043 Acute on chronic combined systolic (congestive) and diastolic (congestive) heart failure: Secondary | ICD-10-CM

## 2018-01-10 DIAGNOSIS — I110 Hypertensive heart disease with heart failure: Secondary | ICD-10-CM | POA: Diagnosis present

## 2018-01-10 DIAGNOSIS — J449 Chronic obstructive pulmonary disease, unspecified: Secondary | ICD-10-CM | POA: Diagnosis present

## 2018-01-10 DIAGNOSIS — Z515 Encounter for palliative care: Secondary | ICD-10-CM | POA: Diagnosis not present

## 2018-01-10 DIAGNOSIS — Z794 Long term (current) use of insulin: Secondary | ICD-10-CM

## 2018-01-10 DIAGNOSIS — T502X5A Adverse effect of carbonic-anhydrase inhibitors, benzothiadiazides and other diuretics, initial encounter: Secondary | ICD-10-CM | POA: Diagnosis not present

## 2018-01-10 DIAGNOSIS — R627 Adult failure to thrive: Secondary | ICD-10-CM | POA: Diagnosis present

## 2018-01-10 DIAGNOSIS — Z888 Allergy status to other drugs, medicaments and biological substances status: Secondary | ICD-10-CM

## 2018-01-10 DIAGNOSIS — W19XXXA Unspecified fall, initial encounter: Secondary | ICD-10-CM | POA: Diagnosis not present

## 2018-01-10 DIAGNOSIS — Z91048 Other nonmedicinal substance allergy status: Secondary | ICD-10-CM

## 2018-01-10 DIAGNOSIS — K7031 Alcoholic cirrhosis of liver with ascites: Secondary | ICD-10-CM | POA: Diagnosis not present

## 2018-01-10 DIAGNOSIS — Z885 Allergy status to narcotic agent status: Secondary | ICD-10-CM

## 2018-01-10 DIAGNOSIS — I50810 Right heart failure, unspecified: Secondary | ICD-10-CM | POA: Diagnosis not present

## 2018-01-10 HISTORY — DX: Type 2 diabetes mellitus without complications: E11.9

## 2018-01-10 HISTORY — DX: Presence of heart assist device: Z95.811

## 2018-01-10 HISTORY — DX: Personal history of other medical treatment: Z92.89

## 2018-01-10 HISTORY — DX: Anxiety disorder, unspecified: F41.9

## 2018-01-10 HISTORY — DX: Pure hypercholesterolemia, unspecified: E78.00

## 2018-01-10 LAB — COMPREHENSIVE METABOLIC PANEL
ALT: 24 U/L (ref 17–63)
AST: 39 U/L (ref 15–41)
Albumin: 3.1 g/dL — ABNORMAL LOW (ref 3.5–5.0)
Alkaline Phosphatase: 201 U/L — ABNORMAL HIGH (ref 38–126)
Anion gap: 13 (ref 5–15)
BUN: 47 mg/dL — AB (ref 6–20)
CHLORIDE: 85 mmol/L — AB (ref 101–111)
CO2: 31 mmol/L (ref 22–32)
CREATININE: 1.43 mg/dL — AB (ref 0.61–1.24)
Calcium: 8.7 mg/dL — ABNORMAL LOW (ref 8.9–10.3)
GFR calc Af Amer: 60 mL/min — ABNORMAL LOW (ref >60)
GFR, EST NON AFRICAN AMERICAN: 51 mL/min — AB (ref >60)
Glucose, Bld: 51 mg/dL — ABNORMAL LOW (ref 65–99)
Potassium: 2.9 mmol/L — ABNORMAL LOW (ref 3.5–5.1)
Sodium: 129 mmol/L — ABNORMAL LOW (ref 135–145)
Total Bilirubin: 1.3 mg/dL — ABNORMAL HIGH (ref 0.3–1.2)
Total Protein: 7.3 g/dL (ref 6.5–8.1)

## 2018-01-10 LAB — LACTATE DEHYDROGENASE: LDH: 159 U/L (ref 98–192)

## 2018-01-10 LAB — GLUCOSE, CAPILLARY
GLUCOSE-CAPILLARY: 176 mg/dL — AB (ref 65–99)
GLUCOSE-CAPILLARY: 249 mg/dL — AB (ref 65–99)

## 2018-01-10 LAB — CBC
HCT: 33.9 % — ABNORMAL LOW (ref 39.0–52.0)
HEMOGLOBIN: 10.4 g/dL — AB (ref 13.0–17.0)
MCH: 24.6 pg — AB (ref 26.0–34.0)
MCHC: 30.7 g/dL (ref 30.0–36.0)
MCV: 80.3 fL (ref 78.0–100.0)
Platelets: 197 10*3/uL (ref 150–400)
RBC: 4.22 MIL/uL (ref 4.22–5.81)
RDW: 20.8 % — ABNORMAL HIGH (ref 11.5–15.5)
WBC: 10.2 10*3/uL (ref 4.0–10.5)

## 2018-01-10 LAB — PROTIME-INR
INR: 2.2
Prothrombin Time: 24.2 seconds — ABNORMAL HIGH (ref 11.4–15.2)

## 2018-01-10 LAB — AMMONIA: Ammonia: 72 umol/L — ABNORMAL HIGH (ref 9–35)

## 2018-01-10 LAB — MRSA PCR SCREENING: MRSA by PCR: NEGATIVE

## 2018-01-10 IMAGING — DX DG CHEST 2V
2 series · 2 of 2 positions shown · non-contrast
Comparison: June 20, 2016

CLINICAL DATA: Chest pain and shortness of breath

EXAM:
CHEST  2 VIEW

[w chest pa]
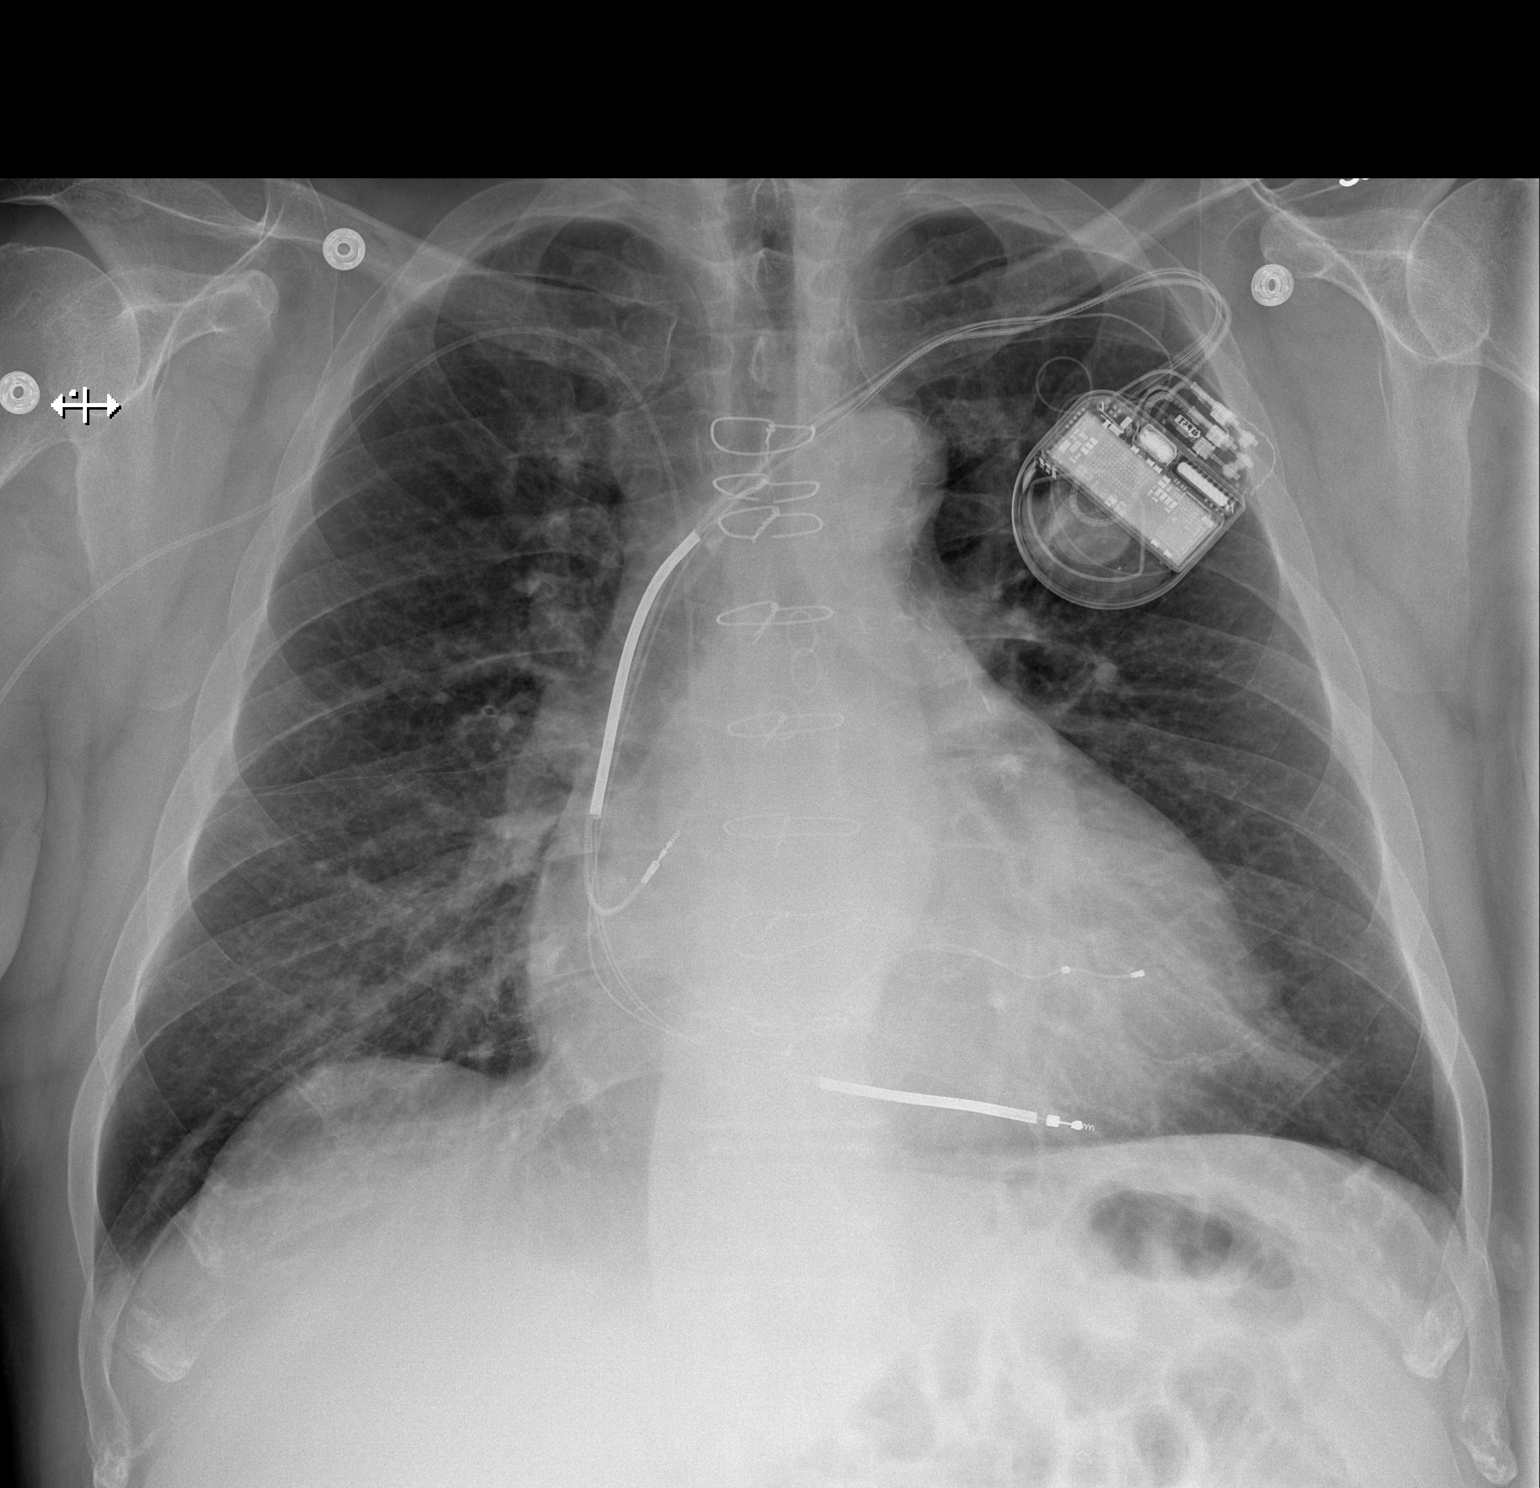

[w chest lat]
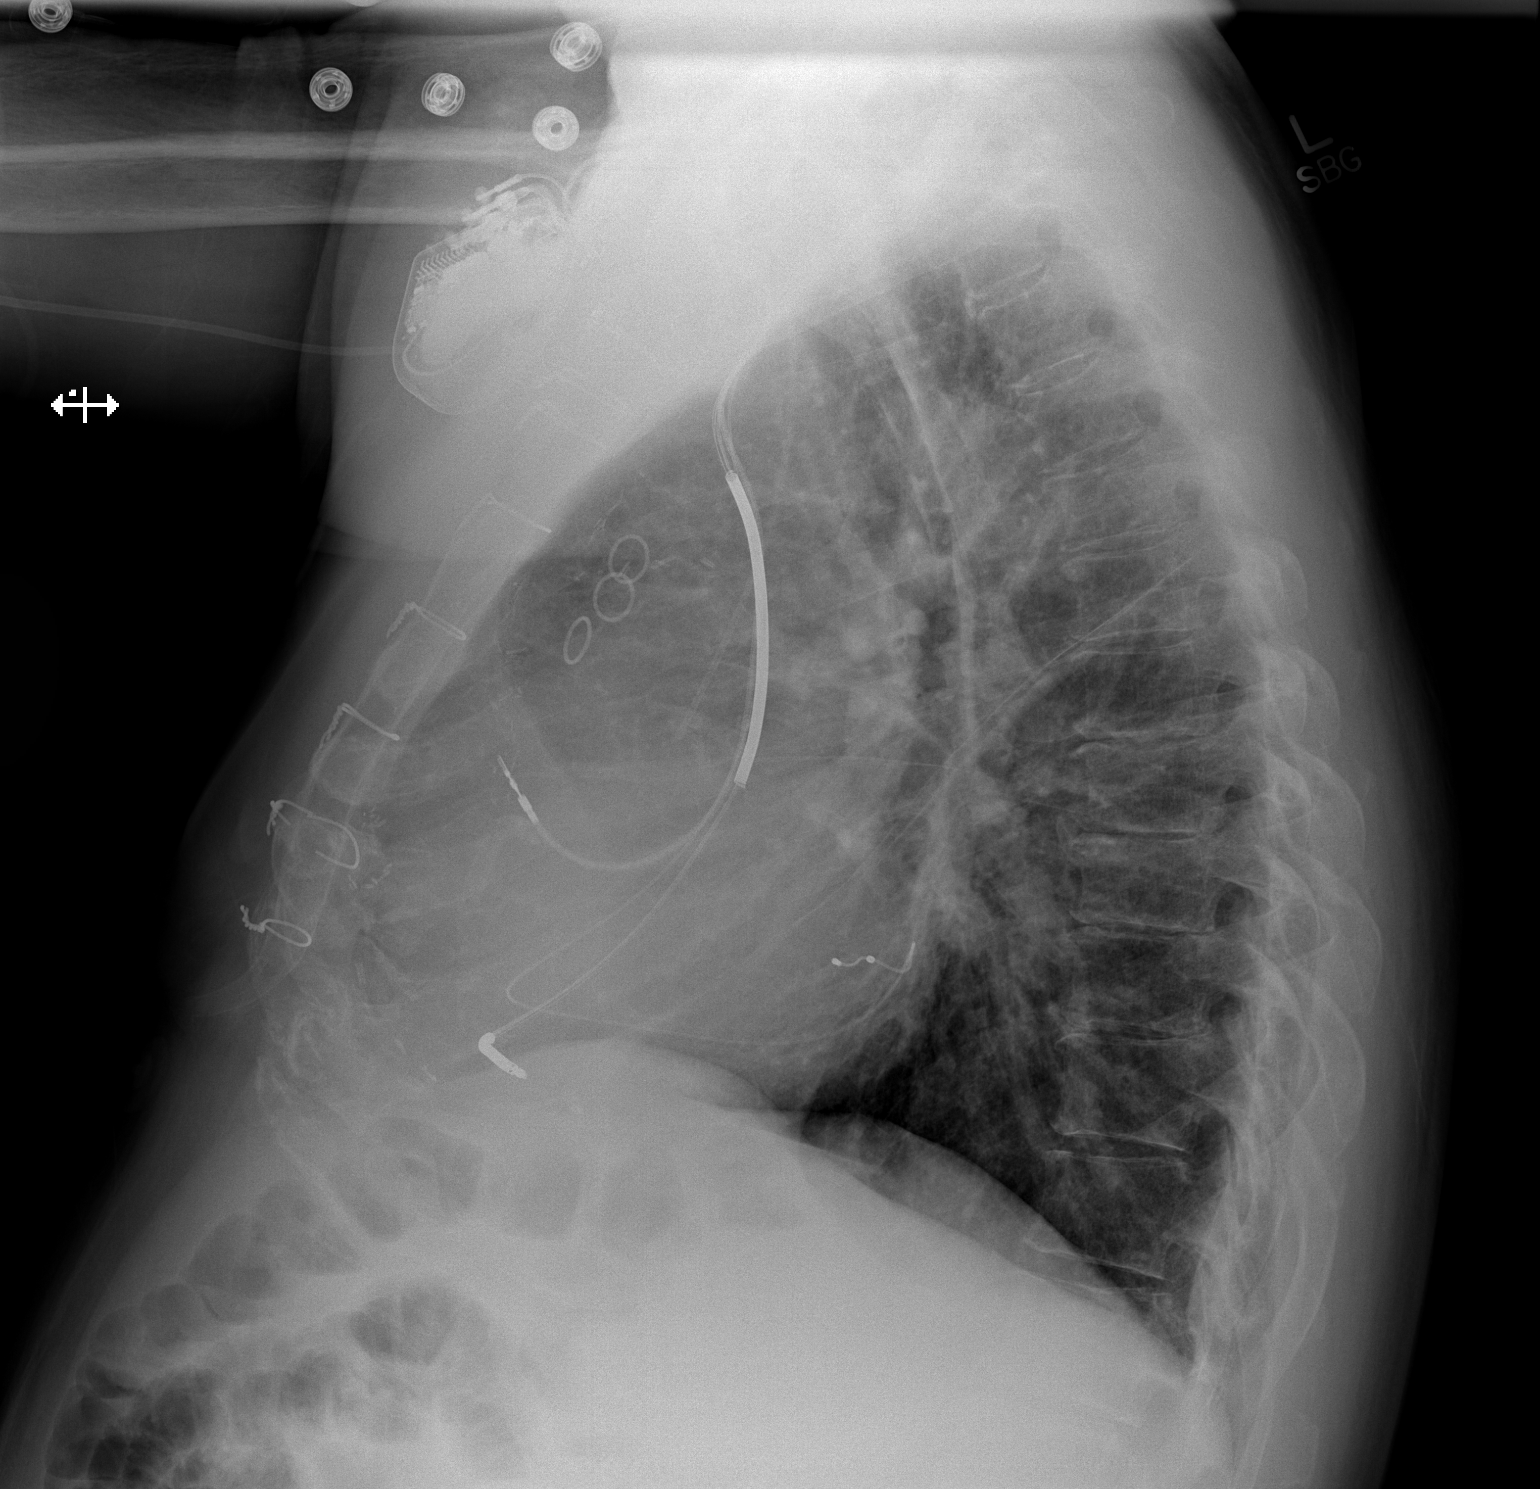

[2 of 2 positions shown; findings below may reference images not displayed]

FINDINGS: Central catheter tip is in the superior vena cava. No pneumothorax.
There is no edema or consolidation. Heart is mildly enlarged with
pulmonary vascularity within normal limits. Pacemaker leads are
attached the right atrium, right ventricle, and left ventricle. No
adenopathy. Patient is status post coronary artery bypass grafting.
No bone lesions.
IMPRESSION: Central catheter tip in superior vena cava. No pneumothorax. No
edema or consolidation. Stable cardiomegaly.

## 2018-01-10 MED ORDER — MIDODRINE HCL 5 MG PO TABS
10.0000 mg | ORAL_TABLET | Freq: Three times a day (TID) | ORAL | Status: DC
  Administered 2018-01-10 – 2018-01-27 (×52): 10 mg via ORAL
  Filled 2018-01-10 (×54): qty 2

## 2018-01-10 MED ORDER — CYCLOBENZAPRINE HCL 10 MG PO TABS
10.0000 mg | ORAL_TABLET | Freq: Three times a day (TID) | ORAL | Status: DC | PRN
  Administered 2018-01-10 – 2018-01-25 (×6): 10 mg via ORAL
  Filled 2018-01-10 (×6): qty 1

## 2018-01-10 MED ORDER — WARFARIN - PHARMACIST DOSING INPATIENT
Freq: Every day | Status: DC
  Administered 2018-01-12 – 2018-01-26 (×5)

## 2018-01-10 MED ORDER — SENNOSIDES-DOCUSATE SODIUM 8.6-50 MG PO TABS: 2.0000 | ORAL_TABLET | Freq: Every evening | ORAL | Status: DC | PRN

## 2018-01-10 MED ORDER — DOCUSATE SODIUM 100 MG PO CAPS
200.0000 mg | ORAL_CAPSULE | Freq: Every day | ORAL | Status: DC
  Administered 2018-01-11 – 2018-01-25 (×13): 200 mg via ORAL
  Filled 2018-01-10 (×15): qty 2

## 2018-01-10 MED ORDER — INSULIN GLARGINE 100 UNIT/ML ~~LOC~~ SOLN
10.0000 [IU] | Freq: Every day | SUBCUTANEOUS | Status: DC
  Administered 2018-01-10 – 2018-01-26 (×17): 10 [IU] via SUBCUTANEOUS
  Filled 2018-01-10 (×18): qty 0.1

## 2018-01-10 MED ORDER — DIGOXIN 125 MCG PO TABS
0.1250 mg | ORAL_TABLET | Freq: Every day | ORAL | Status: DC
  Administered 2018-01-10 – 2018-01-27 (×18): 0.125 mg via ORAL
  Filled 2018-01-10 (×18): qty 1

## 2018-01-10 MED ORDER — TRAZODONE HCL 50 MG PO TABS
50.0000 mg | ORAL_TABLET | Freq: Every evening | ORAL | Status: DC | PRN
  Administered 2018-01-10 – 2018-01-24 (×5): 50 mg via ORAL
  Filled 2018-01-10 (×5): qty 1

## 2018-01-10 MED ORDER — SUCRALFATE 1 GM/10ML PO SUSP
1.0000 g | Freq: Three times a day (TID) | ORAL | Status: DC
  Administered 2018-01-10 – 2018-01-27 (×68): 1 g via ORAL
  Filled 2018-01-10 (×68): qty 10

## 2018-01-10 MED ORDER — ADULT MULTIVITAMIN W/MINERALS CH
1.0000 | ORAL_TABLET | Freq: Every day | ORAL | Status: DC
  Administered 2018-01-11 – 2018-01-27 (×17): 1 via ORAL
  Filled 2018-01-10 (×17): qty 1

## 2018-01-10 MED ORDER — ALTEPLASE 2 MG IJ SOLR
2.0000 mg | Freq: Once | INTRAMUSCULAR | Status: AC
  Administered 2018-01-10: 2 mg

## 2018-01-10 MED ORDER — ONDANSETRON HCL 4 MG/2ML IJ SOLN
4.0000 mg | Freq: Four times a day (QID) | INTRAMUSCULAR | Status: DC | PRN
  Administered 2018-01-11 – 2018-01-24 (×2): 4 mg via INTRAVENOUS
  Filled 2018-01-10 (×2): qty 2

## 2018-01-10 MED ORDER — LACTULOSE 10 GM/15ML PO SOLN
30.0000 g | Freq: Two times a day (BID) | ORAL | Status: DC
  Administered 2018-01-10 – 2018-01-13 (×6): 30 g via ORAL
  Filled 2018-01-10 (×7): qty 45

## 2018-01-10 MED ORDER — PANTOPRAZOLE SODIUM 40 MG PO TBEC
40.0000 mg | DELAYED_RELEASE_TABLET | Freq: Two times a day (BID) | ORAL | Status: DC
  Administered 2018-01-10 – 2018-01-27 (×34): 40 mg via ORAL
  Filled 2018-01-10 (×33): qty 1

## 2018-01-10 MED ORDER — AMIODARONE HCL 200 MG PO TABS
200.0000 mg | ORAL_TABLET | Freq: Every day | ORAL | Status: DC
  Administered 2018-01-10 – 2018-01-27 (×18): 200 mg via ORAL
  Filled 2018-01-10 (×18): qty 1

## 2018-01-10 MED ORDER — MAGNESIUM OXIDE 400 (241.3 MG) MG PO TABS
400.0000 mg | ORAL_TABLET | Freq: Every day | ORAL | Status: DC
  Administered 2018-01-10 – 2018-01-27 (×18): 400 mg via ORAL
  Filled 2018-01-10 (×18): qty 1

## 2018-01-10 MED ORDER — MORPHINE SULFATE (PF) 2 MG/ML IV SOLN
1.0000 mg | Freq: Once | INTRAVENOUS | Status: AC
  Administered 2018-01-11: 1 mg via INTRAVENOUS
  Filled 2018-01-10: qty 1

## 2018-01-10 MED ORDER — FUROSEMIDE 10 MG/ML IJ SOLN
80.0000 mg | Freq: Two times a day (BID) | INTRAMUSCULAR | Status: DC
Start: 2018-01-10 — End: 2018-01-12
  Administered 2018-01-10 – 2018-01-12 (×4): 80 mg via INTRAVENOUS
  Filled 2018-01-10 (×3): qty 8

## 2018-01-10 MED ORDER — ACETAMINOPHEN 500 MG PO TABS: 1000.0000 mg | ORAL_TABLET | Freq: Four times a day (QID) | ORAL | Status: DC | PRN

## 2018-01-10 MED ORDER — DOBUTAMINE IN D5W 4-5 MG/ML-% IV SOLN
7.5000 ug/kg/min | INTRAVENOUS | Status: DC
  Administered 2018-01-10 – 2018-01-27 (×18): 7.5 ug/kg/min via INTRAVENOUS
  Filled 2018-01-10 (×17): qty 250

## 2018-01-10 MED ORDER — DOBUTAMINE IN D5W 4-5 MG/ML-% IV SOLN
7.5000 ug/kg/min | INTRAVENOUS | Status: DC
  Filled 2018-01-10 (×3): qty 250

## 2018-01-10 MED ORDER — INSULIN ASPART 100 UNIT/ML ~~LOC~~ SOLN
5.0000 [IU] | Freq: Three times a day (TID) | SUBCUTANEOUS | Status: DC
  Administered 2018-01-10 – 2018-01-27 (×48): 5 [IU] via SUBCUTANEOUS

## 2018-01-10 MED ORDER — METOLAZONE 2.5 MG PO TABS
2.5000 mg | ORAL_TABLET | Freq: Once | ORAL | Status: AC
  Administered 2018-01-10: 2.5 mg via ORAL
  Filled 2018-01-10: qty 1

## 2018-01-10 MED ORDER — POTASSIUM CHLORIDE CRYS ER 20 MEQ PO TBCR
60.0000 meq | EXTENDED_RELEASE_TABLET | Freq: Once | ORAL | Status: AC
  Administered 2018-01-10: 60 meq via ORAL
  Filled 2018-01-10: qty 3

## 2018-01-10 MED ORDER — ALTEPLASE 2 MG IJ SOLR
2.0000 mg | Freq: Once | INTRAMUSCULAR | Status: AC
  Administered 2018-01-10: 2 mg
  Filled 2018-01-10: qty 2

## 2018-01-10 MED ORDER — ONDANSETRON 4 MG PO TBDP: 4.0000 mg | ORAL_TABLET | Freq: Three times a day (TID) | ORAL | Status: DC | PRN

## 2018-01-10 MED ORDER — WARFARIN SODIUM 7.5 MG PO TABS
7.5000 mg | ORAL_TABLET | Freq: Once | ORAL | Status: AC
  Administered 2018-01-10: 7.5 mg via ORAL
  Filled 2018-01-10: qty 1

## 2018-01-10 MED ORDER — GABAPENTIN 300 MG PO CAPS
300.0000 mg | ORAL_CAPSULE | Freq: Two times a day (BID) | ORAL | Status: DC
  Administered 2018-01-10 – 2018-01-27 (×34): 300 mg via ORAL
  Filled 2018-01-10 (×34): qty 1

## 2018-01-10 MED ORDER — SODIUM CHLORIDE 0.9% FLUSH: 10.0000 mL | INTRAVENOUS | Status: DC | PRN

## 2018-01-10 MED ORDER — CITALOPRAM HYDROBROMIDE 20 MG PO TABS
20.0000 mg | ORAL_TABLET | Freq: Every day | ORAL | Status: DC
  Administered 2018-01-10 – 2018-01-27 (×18): 20 mg via ORAL
  Filled 2018-01-10 (×18): qty 1

## 2018-01-10 MED ORDER — SPIRONOLACTONE 12.5 MG HALF TABLET
12.5000 mg | ORAL_TABLET | Freq: Every day | ORAL | Status: DC
  Administered 2018-01-10 – 2018-01-14 (×5): 12.5 mg via ORAL
  Filled 2018-01-10 (×6): qty 1

## 2018-01-10 MED ORDER — POTASSIUM CHLORIDE CRYS ER 20 MEQ PO TBCR
40.0000 meq | EXTENDED_RELEASE_TABLET | Freq: Two times a day (BID) | ORAL | Status: DC
  Administered 2018-01-10 – 2018-01-22 (×26): 40 meq via ORAL
  Filled 2018-01-10 (×27): qty 2

## 2018-01-10 MED ORDER — ACETAMINOPHEN 325 MG PO TABS
650.0000 mg | ORAL_TABLET | ORAL | Status: DC | PRN
  Administered 2018-01-10 – 2018-01-25 (×5): 650 mg via ORAL
  Filled 2018-01-10 (×5): qty 2

## 2018-01-10 NOTE — Progress Notes (Signed)
ANTICOAGULATION CONSULT NOTE - Initial Consult  Pharmacy Consult for warfarin Indication: LVAD  Allergies  Allergen Reactions  . Chlorhexidine Gluconate Itching and Rash  . Codeine Nausea And Vomiting and Other (See Comments)    In high doses  . Lipitor [Atorvastatin] Nausea Only and Other (See Comments)    Nausea with high doses, tolerates 20mg  dose (08/22/16)  . Tape Other (See Comments)    Paper tape is ok    Patient Measurements: Weight: 215 lb (97.5 kg) Height: 5'9"  Vital Signs: BP: 95/63 (04/09 1132)  Labs: Recent Labs    01/10/18 1115  HGB 10.4*  HCT 33.9*  PLT 197  LABPROT 24.2*  INR 2.20  CREATININE 1.43*    Estimated Creatinine Clearance: 62.5 mL/min (A) (by C-G formula based on SCr of 1.43 mg/dL (H)).   Medical History: Past Medical History:  Diagnosis Date  . AICD (automatic cardioverter/defibrillator) present   . ASCVD (arteriosclerotic cardiovascular disease)   . Chronic systolic CHF (congestive heart failure) ([place])   . COPD (chronic obstructive pulmonary disease) ([place])   . Coronary artery disease   . Depression   . Diabetes mellitus   . GI bleed   . History of cocaine abuse   . Hypertension   . Presence of permanent cardiac pacemaker   . Shortness of breath dyspnea   . Suicidal ideation   . Tobacco abuse     Medications:  Scheduled:  . amiodarone  200 mg Oral Daily  . citalopram  20 mg Oral Daily  . digoxin  0.125 mg Oral Daily  . docusate sodium  200 mg Oral QHS  . furosemide  80 mg Intravenous BID  . gabapentin  300 mg Oral BID  . insulin aspart  5 Units Subcutaneous TID WC  . insulin glargine  10 Units Subcutaneous Q2200  . lactulose  30 g Oral BID  . magnesium oxide  400 mg Oral Daily  . metolazone  2.5 mg Oral Once  . midodrine  10 mg Oral TID WC  . [START ON 01/11/2018] multivitamin with minerals  1 tablet Oral Daily  . pantoprazole  40 mg Oral BID  . potassium chloride  40 mEq Oral BID  . spironolactone  12.5 mg Oral  Daily  . sucralfate  1 g Oral TID WC & HS    Assessment: 37 yom admitted with acute encephalopathy after loss of dobutamine access, on warfarin PTA for hx of LVAD (HeartMate II).   Home regimen is 7.5 mg daily except 5 mg on Monday and Friday - last dose was on 4/8. Hgb is 10.4, plts 197. No signs/symptoms of bleeding.   Goal of Therapy:  INR 1.8-2.4 Monitor platelets by anticoagulation protocol: Yes   Plan:  Order warfarin 7.5 mg tonight Monitor CBC, INR  Doylene Canard, PharmD Clinical Pharmacist  Pager: [phone] Clinical Phone for 01/10/2018 until 3:30pm: [phone] If after 3:30pm, please call main pharmacy at [phone] 01/10/2018,2:12 PM

## 2018-01-10 NOTE — H&P (Addendum)
Advanced Heart Failure VAD History and Physical Note   PCP-Cardiologist: Glori Bickers, MD   Reason for Admission: Acute encephalopathy/ A/C systolic CHF  HPI:    Bacilio Abascal. is a 62 y.o. male  with history of chronic systolic HF s/p Medtronic ICD 2014, CAD s/p CABG in 2000, HTN, Hx of cocaine abuse, Tobacco abuse, Depression, PAF, and COPD. HM3 placed 09/09/2016. In April 2018 he was placed on milrinone but later switched to dobutamine. He remains on dobutamine 7.6mcg.   Admitted 2/6-2/18/2019 with recurrent volume overload, RHF and ascites despite dobutamine support. Underwent paracentesis on 2/7 and 2/12. He had over 11 liters removed from paracentesis. Diuresed with IV lasix and transitioned back to home torsemide 100 mg/50mg  daily. Overall he lost 47 pounds. Also treated for hepatic encephalopathy. MAPs were soft with diuresis so midodrine increased to 10 tid. Weight on d/c 195 pounds.   Last seen in CHF clinic 12/07/17. His breathing and edema had improved from recent visit where metolazone was increased. He denied orthopnea or PND.   Pt has continued to struggle with ascites. Had Paracentesis 12/27/17 with 2.8 L.   Pt found in [facility] Clinic lobby today. He did not have a scheduled appointment.  Pt visibly confused with smell of urine.  Of note, his dobutamine bag was not connected. Pt helped to clinic. He states he woke up this am and was soaking wet. His dobutamine line had "smashed".  It is broken cleanly in several places. His abdominal distention and peripheral edema has been worse. He is not able to say how he felt yesterday, but has felt bad today. He drove himself here today. He is alert to person and place.   LVAD INTERROGATION:  HeartMate II LVAD:  Flow 5.3 liters/min, speed 5800, power 4.5, PI 2.2. No PI events.     Review of Systems: [y] = yes, [ ]  = no   General: Weight gain [y]; Weight loss [ ] ; Anorexia [ ] ; Fatigue [y]; Fever [ ] ; Chills [ ] ; Weakness [name] ]    Cardiac: Chest pain/pressure [ ] ; Resting SOB [ ] ; Exertional SOB [y]; Orthopnea [y]; Pedal Edema [y]; Palpitations [ ] ; Syncope [ ] ; Presyncope [ ] ; Paroxysmal nocturnal dyspnea[ ]   Pulmonary: Cough [ ] ; Wheezing[ ] ; Hemoptysis[ ] ; Sputum [ ] ; Snoring [ ]   GI: Vomiting[ ] ; Dysphagia[ ] ; Melena[ ] ; Hematochezia [ ] ; Heartburn[ ] ; Abdominal pain [ ] ; Constipation [ ] ; Diarrhea [ ] ; BRBPR [ ]   GU: Hematuria[ ] ; Dysuria [ ] ; Nocturia[ ]   Vascular: Pain in legs with walking [ ] ; Pain in feet with lying flat [ ] ; Non-healing sores [ ] ; Stroke [ ] ; TIA [ ] ; Slurred speech [ ] ;  Neuro: Headaches[ ] ; Vertigo[ ] ; Seizures[ ] ; Paresthesias[ ] ;Blurred vision [ ] ; Diplopia [ ] ; Vision changes [ ]   Ortho/Skin: Arthritis [y]; Joint pain [y]; Muscle pain [ ] ; Joint swelling [ ] ; Back Pain [ ] ; Rash [ ]   Psych: Depression[ ] ; Anxiety[ ]   Heme: Bleeding problems [ ] ; Clotting disorders [ ] ; Anemia [ ]   Endocrine: Diabetes [ ] ; Thyroid dysfunction[ ]     Home Medications Prior to Admission medications   Medication Sig Start Date End Date Taking? Authorizing Provider  acetaminophen (TYLENOL) 500 MG tablet Take 1,000 mg by mouth every 6 (six) hours as needed for moderate pain or headache.    [provider]  amiodarone (PACERONE) 200 MG tablet TAKE ONE TABLET BY MOUTH ONCE DAILY Patient taking differently: TAKE ONE TABLET (  200mg ) BY MOUTH ONCE DAILY 10/31/17   Bensimhon, Shaune Pascal, MD  citalopram (CELEXA) 20 MG tablet Take 1 tablet (20 mg total) by mouth daily. 05/09/17   Clegg, Amy D, NP  cyclobenzaprine (FLEXERIL) 10 MG tablet Take 1 tablet (10 mg total) by mouth 3 (three) times daily as needed for muscle spasms. 11/30/17   Larey Dresser, MD  digoxin (DIGOX) 0.125 MG tablet Take 1 tablet (0.125 mg total) daily by mouth. 08/18/17   Bensimhon, Shaune Pascal, MD  DOBUTamine (DOBUTREX) 4-5 MG/ML-% infusion Inject 714.75 mcg/min into the vein continuous. PER [facility] pharmacy 11/21/17   Darrick Grinder D, NP  docusate sodium  (COLACE) 100 MG capsule Take 200 mg by mouth at bedtime.     [provider]  gabapentin (NEURONTIN) 300 MG capsule Take 1 capsule (300 mg total) by mouth 2 (two) times daily. 09/30/17   Bensimhon, Shaune Pascal, MD  insulin aspart (NOVOLOG FLEXPEN) 100 UNIT/ML FlexPen Inject 5 Units into the skin 3 (three) times daily with meals. 05/09/17   Clegg, Amy D, NP  Insulin Glargine (LANTUS SOLOSTAR) 100 UNIT/ML Solostar Pen Inject 10 Units into the skin daily at 10 pm. 11/21/17   Clegg, Amy D, NP  lactulose (CHRONULAC) 10 GM/15ML solution Take 45 mLs (30 g total) by mouth 2 (two) times daily. 11/21/17   Clegg, Amy D, NP  magnesium oxide (MAG-OX) 400 (241.3 Mg) MG tablet Take 1 tablet (400 mg total) by mouth daily. 05/09/17   Clegg, Amy D, NP  metolazone (ZAROXOLYN) 2.5 MG tablet Take 2.5 mg by mouth. Take one tab every Mon and Fri    [provider]  midodrine (PROAMATINE) 10 MG tablet Take 1 tablet (10 mg total) by mouth 3 (three) times daily with meals. 11/21/17   Clegg, Amy D, NP  Multiple Vitamin (MULTIVITAMIN WITH MINERALS) TABS tablet Take 1 tablet by mouth daily.    [provider]  ondansetron (ZOFRAN ODT) 4 MG disintegrating tablet Take 1 tablet (4 mg total) by mouth every 8 (eight) hours as needed for nausea. 04/10/17   Tanna Furry, MD  pantoprazole (PROTONIX) 40 MG tablet TAKE 1 TABLET BY MOUTH TWICE DAILY WITH A MEAL 01/05/18   Bensimhon, Shaune Pascal, MD  potassium chloride SA (K-DUR,KLOR-CON) 20 MEQ tablet Take 2 tablets (40 mEq total) by mouth 2 (two) times daily. Take 40 meq in am and 20 meq in pm 12/30/17   Clegg, Amy D, NP  senna-docusate (SENOKOT S) 8.6-50 MG tablet Take 2 tablets by mouth at bedtime as needed for mild constipation. 05/09/17   Clegg, Amy D, NP  spironolactone (ALDACTONE) 25 MG tablet Take 0.5 tablets (12.5 mg total) by mouth daily. 06/15/17   [name] Friar, PA-C  sucralfate (CARAFATE) 1 GM/10ML suspension Take 10 mLs (1 g total) by mouth 4 (four) times daily -   with meals and at bedtime. 05/09/17   Darrick Grinder D, NP  torsemide (DEMADEX) 100 MG tablet Take 100mg  in the morning and 50mg  in the evening 11/22/17   Clegg, Amy D, NP  traZODone (DESYREL) 50 MG tablet Take 1 tablet (50 mg total) by mouth at bedtime as needed for sleep. 12/20/17   [name] Friar, PA-C  warfarin (COUMADIN) 5 MG tablet Take 1 and 1/2 tablets (7.5 mg) daily except 1 tablet (5 mg) on Monday and Friday    [provider]    Past Medical History: Past Medical History:  Diagnosis Date  . AICD (automatic cardioverter/defibrillator) present   .  ASCVD (arteriosclerotic cardiovascular disease)   . Chronic systolic CHF (congestive heart failure) ([place])   . COPD (chronic obstructive pulmonary disease) ([place])   . Coronary artery disease   . Depression   . Diabetes mellitus   . GI bleed   . History of cocaine abuse   . Hypertension   . Presence of permanent cardiac pacemaker   . Shortness of breath dyspnea   . Suicidal ideation   . Tobacco abuse     Past Surgical History: Past Surgical History:  Procedure Laterality Date  . CARDIAC CATHETERIZATION N/A 02/12/2016   Procedure: Right/Left Heart Cath and Coronary/Graft Angiography;  Surgeon: Jolaine Artist, MD;  Location: [place] [facility];  Service: Cardiovascular;  Laterality: N/A;  . CARDIAC CATHETERIZATION N/A 03/22/2016   Procedure: Right Heart Cath;  Surgeon: Jolaine Artist, MD;  Location: [place] [facility];  Service: Cardiovascular;  Laterality: N/A;  . CARDIAC CATHETERIZATION N/A 08/25/2016   Procedure: Right Heart Cath;  Surgeon: Jolaine Artist, MD;  Location: [place] [facility];  Service: Cardiovascular;  Laterality: N/A;  . CARDIAC CATHETERIZATION N/A 09/03/2016   Procedure: Right Heart Cath;  Surgeon: Jolaine Artist, MD;  Location: [place] [facility];  Service: Cardiovascular;  Laterality: N/A;  . CARDIAC DEFIBRILLATOR PLACEMENT    . CARDIAC DEFIBRILLATOR PLACEMENT    . CHOLECYSTECTOMY N/A  08/30/2015   Procedure: LAPAROSCOPIC CHOLECYSTECTOMY;  Surgeon: Hubbard Robinson, MD;  Location: [name];  Service: General;  Laterality: N/A;  . COLONOSCOPY WITH PROPOFOL N/A 08/18/2016   Procedure: COLONOSCOPY WITH PROPOFOL;  Surgeon: Jerene Bears, MD;  Location: [place];  Service: Gastroenterology;  Laterality: N/A;  . CORONARY ARTERY BYPASS GRAFT  2001  . ENTEROSCOPY N/A 05/02/2017   Procedure: ENTEROSCOPY;  Surgeon: Mauri Pole, MD;  Location: [facility] ENDOSCOPY;  Service: Endoscopy;  Laterality: N/A;  . GIVENS CAPSULE STUDY N/A 06/01/2017   Procedure: GIVENS CAPSULE STUDY;  Surgeon: Jerene Bears, MD;  Location: [place];  Service: Gastroenterology;  Laterality: N/A;  . INSERT / REPLACE / REMOVE PACEMAKER    . INSERTION OF IMPLANTABLE LEFT VENTRICULAR ASSIST DEVICE N/A 09/09/2016   Procedure: INSERTION OF IMPLANTABLE LEFT VENTRICULAR ASSIST DEVICE;  Surgeon: [name], MD;  Location: [place];  Service: Open Heart Surgery;  Laterality: N/A;  HeartMate 3 CIRC ARREST  NITRIC OXIDE  . IR FLUORO GUIDE CV LINE RIGHT  01/28/2017  . IR GENERIC HISTORICAL  08/03/2016   IR FLUORO GUIDE CV LINE RIGHT 08/03/2016 [name], MD [facility]  . IR GENERIC HISTORICAL  12/14/2016   IR FLUORO GUIDE CV LINE RIGHT 12/14/2016 [name], MD [facility]  . IR PARACENTESIS  11/10/2017  . IR PARACENTESIS  11/15/2017  . IR PARACENTESIS  12/27/2017  . IR US GUIDE VASC ACCESS RIGHT  01/28/2017  . LUMBAR DISC SURGERY  02/1999   Discectomy and fusion  . RIGHT HEART CATH N/A 02/11/2017   Procedure: Right Heart Cath;  Surgeon: Jolaine Artist, MD;  Location: [place] [facility];  Service: Cardiovascular;  Laterality: N/A;  . TEE WITHOUT CARDIOVERSION N/A 09/09/2016   Procedure: TRANSESOPHAGEAL ECHOCARDIOGRAM (TEE);  Surgeon: [name], MD;  Location: [place];  Service: Open Heart Surgery;  Laterality: N/A;  . VASECTOMY     Subsequent reversal    Family History: Family History    Problem Relation Age of Onset  . Arthritis Mother   . Hyperlipidemia Mother   . CAD Father   .  Diabetes Father   . Heart failure Father   . Alcohol abuse Father   . Hyperlipidemia Father   . Hyperlipidemia Maternal Grandmother   . Hyperlipidemia Maternal Grandfather   . Hyperlipidemia Paternal Grandmother   . Hyperlipidemia Paternal Grandfather     Social History: Social History   Socioeconomic History  . Marital status: Divorced    Spouse name: Not on file  . Number of children: 1  . Years of education: 5  . Highest education level: Not on file  Occupational History  . Occupation: Retired  . Occupation: [employer] operational officer  . Financial resource strain: Not on file  . Food insecurity:    Worry: Not on file    Inability: Not on file  . Transportation needs:    Medical: Not on file    Non-medical: Not on file  Tobacco Use  . Smoking status: Former Smoker    Packs/day: 1.50    Years: 23.00    Pack years: 34.50    Types: Cigarettes    Start date: 10/24/1992    Last attempt to quit: 07/01/2016    Years since quitting: 1.5  . Smokeless tobacco: Never Used  Substance and Sexual Activity  . Alcohol use: No    Alcohol/week: 0.0 oz  . Drug use: Yes    Frequency: 0.1 times per week    Types: Cocaine    Comment: 07/01/2016 last use relapse after 8 yrs  . Sexual activity: Not Currently  Lifestyle  . Physical activity:    Days per week: Not on file    Minutes per session: Not on file  . Stress: Not on file  Relationships  . Social connections:    Talks on phone: Not on file    Gets together: Not on file    Attends religious service: Not on file    Active member of club or organization: Not on file    Attends meetings of clubs or organizations: Not on file    Relationship status: Not on file  Other Topics Concern  . Not on file  Social History Narrative   Divorced with one child   No regular exercise    Allergies:  Allergies  Allergen Reactions  .  Chlorhexidine Gluconate Itching and Rash  . Codeine Nausea And Vomiting and Other (See Comments)    In high doses  . Lipitor [Atorvastatin] Nausea Only and Other (See Comments)    Nausea with high doses, tolerates 20mg  dose (08/22/16)  . Tape Other (See Comments)    Paper tape is ok    Objective:    Vital Signs:   HR 56 Auto Cuff 95/63 (83) mean Pulse Ox 94%  Mean arterial Pressure 80s  Physical Exam    General:  Fatigued and chronically ill appearing. NAD.  HEENT: Normal Neck: supple. JVP to jaw. Carotids 2+ bilat; no bruits. No lymphadenopathy or thyromegaly appreciated. Cor: LVAD hum. HM3 sounds. Lungs: Diminished basilar sounds.  Abdomen: Markedly distended. No HSM appreciated through ascites. + BS. Driveline site with mild redness.  Driveline: C/D/I; securement device intact and driveline incorporated Extremities: no cyanosis, clubbing, or rash. 1-2+ edema.  Neuro: alert to person and place.   Telemetry   Not yet connected.   EKG   Not yet performed.   Labs    Basic Metabolic Panel: No results for input(s): NA, K, CL, CO2, GLUCOSE, BUN, CREATININE, CALCIUM, MG, PHOS in the last 168 hours.  Liver Function Tests: No results for input(s): AST,  ALT, ALKPHOS, BILITOT, PROT, ALBUMIN in the last 168 hours. No results for input(s): LIPASE, AMYLASE in the last 168 hours. Recent Labs  Lab 01/05/18 1217  AMMONIA 82*    CBC: No results for input(s): WBC, NEUTROABS, HGB, HCT, MCV, PLT in the last 168 hours.  Cardiac Enzymes: No results for input(s): CKTOTAL, CKMB, CKMBINDEX, TROPONINI in the last 168 hours.  BNP: BNP (last 3 results) Recent Labs    01/21/17 1230  BNP 649.9*    ProBNP (last 3 results) No results for input(s): PROBNP in the last 8760 hours.   CBG: No results for input(s): GLUCAP in the last 168 hours.  Coagulation Studies: No results for input(s): LABPROT, INR in the last 72 hours.   Imaging     No results found.  Patient  Profile:   Ronald Miller. is a 62 y.o. male  with history of chronic systolic HF s/p Medtronic ICD 2014, CAD s/p CABG in 2000, HTN, Hx of cocaine abuse, Tobacco abuse, Depression, PAF, and COPD. HM3 placed 09/09/2016.   Admitted from clinic with encephalopathy and acute on chronic CHF. Dobutamine disconnected sometime overnight.   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 Has Medtronic ICD.  - Admitted with volume overload and acute encephalopathy.  - Follow CVP and Coox.  - Volume status markedly elevated on exam - Start lasix 80 mg IV BID and will give 2.5 metolazone today.  - Resume dobutamine 7.5 mcg/kg/min. (On chronically for RV failure) - Continue mididrine 10 mg tid now.   - Not a good heart transplant candidate. Has reached end stage biventricular failure. May need to address [place] once his confusion has improved.  2. CAD:  - Stable.  - No s/s of ischemia.    3. DMII:  - Will use home regimen plus sliding scale.  4. PAF: ?underlying atrial rhythm - EKG 11/13/17 with profound artifact.  5. Anemia, iron deficiency: Had capsule endoscopy 04/2017 with severe gastritis. Continue carafate.  - Denies bleeding. CBC standing.  6. Anxiety/Depression  - Continue celexa. Stable.  7. H/O RUE DVT:  - On coumadin. INR pending. Dosing per Pharm D.  8. Chronic Anticoagulation: On coumadin, INR goal 1.8 - 2.4.  - INR pending. Dosing per pharm.  9. Cardiac cirrhosis with ascites:  - Had Paracentesis 12/27/17 with 2.8 L.   (Had total of nearly 10 out last admission, and belly remains markedly distended) - Will send for repeat Paracentesis. Will plan for tomorrow while we await INR.  10. Confusion/Hepatic Encephalopathy - Ammonia 58 11/15/17. + Asterixis on exam.  - Will check ammonia  - Lactulose increased 2/15 11. Hyponatremia  - BMET depending.    I reviewed the LVAD parameters from today, and compared the results to the patient's prior  recorded data.  No programming changes were made.  The LVAD is functioning within specified parameters.  The patient performs LVAD self-test daily.  LVAD interrogation was negative for any significant power changes, alarms or PI events/speed drops.  LVAD equipment check completed and is in good working order.  Back-up equipment present.   LVAD education done on emergency procedures and precautions and reviewed exit site care.  Length of Stay: 0  Annamaria Helling 01/10/2018, 11:30 AM  VAD Team Pager [phone] (7am - 7am) +++VAD ISSUES ONLY+++   Advanced Heart Failure Team Pager [phone] (M-F; [place])  Please contact [facility] for night-coverage after hours (4p -7a ) and weekends on [email or web address]  for all non- LVAD Issues  Patient seen with PA, agree with the above note.   He was found in the lobby today confused and short of breath.  Dobutamine line has been broken since at least yesterday and he has not been getting dobutamine.  NH3 72, CBC and LDH unremarkable, Creatinine near baseline at 1.43 but he is hyponatremic with Na 129 and hypokalemic.   On exam, he is markedly volume overloaded with tight abdomen, JVP 16+ cm, and peripheral edema.   Mr Empson is markedly volume overloaded with biventricular failure.  He has been gradually worsening over time and is not a transplant candidate.  However, he is probably acute worse with loss of dobutamine for about a day.  Overall, poor prognosis.  - Admit for diuresis.   - Restart dobutamine gtt.  - Lasix 80 mg IV bid with metolazone 2.5 x 1 now.  - Continue midodrine.  - Follow co-ox and CVP off PICC line.   With tense ascites, we will arrange for therapeutic paracentesis.   Continue coumadin for goal INR 1.8-2.3.   Suspect a component of hepatic encephalopathy.  Continue lactulose, replace K.   Hypervolemic hyponatremia: Diurese and fluid restrict.   Loralie Champagne 01/10/2018 12:52 PM

## 2018-01-10 NOTE — Progress Notes (Signed)
Patient was found wandering around the heart and vascular atrium area. Pt did not have an appt today but states that something was wrong with his dobutamine pump so he just came to the hospital.  On initial assessment pt was confused to place and time. [facility]. Assessment of Dobutamine pump showed that the line was severed in 2 different places. The Dobutamine bag was missing, but the spike had been taken out of the dobutamine bag and was placed in the pts fanny pack.   Pt has marked ascites and will need Dobutamine started back asap.  Vital Signs:  Doppler Pressure 78  Automatc BP:  95/63 (83) HR: 93  SPO2:unable to obtain %  Weight: 204.2 lbs w/o eqt Last weight: 213.2 lbs last clinic visit Home weights: 203 lbs this AM   VAD Indication: Destination Therapy - eval complete at [facility]  VAD interrogation & Equipment Management: Speed:5800 Flow: 5.4 Power:4.6 w    PI:2.4 Hct: 35  Alarms: none Events: 0 - 5 daily  Fixed speed 5800 Low speed limit: 5500  Primary Controller:  Replace back up battery in 23 months. Back up controller:   Replace back battery in 23 months  Annual Equipment Maintenance on UBC/PM was performed on 09/2017.   I reviewed the LVAD parameters from today and compared the results to the patient's prior recorded data. LVAD interrogation was NEGATIVE for significant power changes, NEGATIVE for clinical alarms and STABLE for PI events/speed drops. No programming changes were made and pump is functioning within specified parameters. Pt is performing daily controller and system monitor self tests along with completing weekly and monthly maintenance for LVAD equipment.  LVAD equipment check completed and is in good working order. Back-up equipment not present today. Reminded patient he should keep black bag with him at all times.    Exit Site Care: Drive line is being maintained weekly  by VAD Coordinators. Drive line exit  site well healed and incorporated. The velour is fully implanted at exit site. Dressing dry and intact; anchor in place.   Significant Events on VAD Support:  11/30/16> Milrinone gtt at discharge 01/27/17>dobutamine at 5 mcg/kg/mingtt at discharge 03/08/17> RV failure 04/29/17> symptomatic anemia and melena. EGD revealed stomach ulcers. Carafate added; ASA stopped 05/31/17>Ongoing symptomatic anemia and melena; capsule study mild antral gastritis; poss tiny AVM right colon. Lower MAPS, Losartan cut back. 06/2017> admit with syncope, AMS, Dobutamine at 7.41mcg 11/09/17>Marked ascites, RV failure, and hepatic encephalopathy. Paracentesis on 2/7 and 2/12 with >11 liters fluid removed   Device:Protect Therapies: off; end of service 08/2016 Last check: complete interrogation 05/26/2017. DDD (lower rate 60); BiV pacing with 96% AS-VP  BP & Labs:  MAP 78 - Doppler is reflecting MAP  Hgb 10.4 - No S/S of bleeding. Specifically denies melena/BRBPR or nosebleeds.  LDH stable at 159 with established baseline of 150- 250. Denies tea-colored urine. No power elevations noted on interrogation.   Ammonia 72 - remains elevated. Pt reports he is "taking a swig" of Lactulose twice daily. He is not having any diarrhea.   Dobutamine 7.5 mcg/kg/min started back in clinic  Patient Instructions: 1. Admit pt for diuresis and paracentesis.  Tanda Rockers RN [place] Coordinator   Office: [phone] 24/7 Emergency VAD Pager: [phone]

## 2018-01-11 ENCOUNTER — Encounter (HOSPITAL_COMMUNITY): Payer: Self-pay | Admitting: Student

## 2018-01-11 ENCOUNTER — Inpatient Hospital Stay (HOSPITAL_COMMUNITY): Payer: Medicare HMO

## 2018-01-11 DIAGNOSIS — K7031 Alcoholic cirrhosis of liver with ascites: Secondary | ICD-10-CM

## 2018-01-11 DIAGNOSIS — I5023 Acute on chronic systolic (congestive) heart failure: Secondary | ICD-10-CM

## 2018-01-11 DIAGNOSIS — K7290 Hepatic failure, unspecified without coma: Secondary | ICD-10-CM

## 2018-01-11 HISTORY — PX: IR PARACENTESIS: IMG2679

## 2018-01-11 LAB — GLUCOSE, CAPILLARY
GLUCOSE-CAPILLARY: 182 mg/dL — AB (ref 65–99)
Glucose-Capillary: 268 mg/dL — ABNORMAL HIGH (ref 65–99)
Glucose-Capillary: 170 mg/dL — ABNORMAL HIGH (ref 65–99)

## 2018-01-11 LAB — CBC
HCT: 30.3 % — ABNORMAL LOW (ref 39.0–52.0)
Hemoglobin: 9.8 g/dL — ABNORMAL LOW (ref 13.0–17.0)
MCH: 25.7 pg — ABNORMAL LOW (ref 26.0–34.0)
MCHC: 32.3 g/dL (ref 30.0–36.0)
MCV: 79.3 fL (ref 78.0–100.0)
Platelets: 158 10*3/uL (ref 150–400)
RBC: 3.82 MIL/uL — ABNORMAL LOW (ref 4.22–5.81)
RDW: 20.6 % — AB (ref 11.5–15.5)
WBC: 8 10*3/uL (ref 4.0–10.5)

## 2018-01-11 LAB — BASIC METABOLIC PANEL
Anion gap: 14 (ref 5–15)
BUN: 45 mg/dL — ABNORMAL HIGH (ref 6–20)
CALCIUM: 8.2 mg/dL — AB (ref 8.9–10.3)
CO2: 29 mmol/L (ref 22–32)
CREATININE: 1.41 mg/dL — AB (ref 0.61–1.24)
Chloride: 82 mmol/L — ABNORMAL LOW (ref 101–111)
GFR calc non Af Amer: 52 mL/min — ABNORMAL LOW (ref >60)
Glucose, Bld: 166 mg/dL — ABNORMAL HIGH (ref 65–99)
Potassium: 3.5 mmol/L (ref 3.5–5.1)
SODIUM: 125 mmol/L — AB (ref 135–145)

## 2018-01-11 LAB — LACTATE DEHYDROGENASE: LDH: 136 U/L (ref 98–192)

## 2018-01-11 LAB — PROTIME-INR
INR: 2.21
PROTHROMBIN TIME: 24.3 s — AB (ref 11.4–15.2)

## 2018-01-11 MED ORDER — LIDOCAINE HCL (PF) 2 % IJ SOLN
INTRAMUSCULAR | Status: DC | PRN
  Administered 2018-01-11: 10 mL

## 2018-01-11 MED ORDER — METOLAZONE 2.5 MG PO TABS
2.5000 mg | ORAL_TABLET | Freq: Every day | ORAL | Status: DC
  Administered 2018-01-11: 2.5 mg via ORAL
  Filled 2018-01-11 (×2): qty 1

## 2018-01-11 MED ORDER — WARFARIN SODIUM 7.5 MG PO TABS
7.5000 mg | ORAL_TABLET | Freq: Once | ORAL | Status: AC
  Administered 2018-01-11: 7.5 mg via ORAL
  Filled 2018-01-11: qty 1

## 2018-01-11 MED ORDER — LIDOCAINE HCL (PF) 2 % IJ SOLN
INTRAMUSCULAR | Status: AC
  Filled 2018-01-11: qty 20

## 2018-01-11 NOTE — Progress Notes (Signed)
[place] for warfarin Indication: LVAD  Allergies  Allergen Reactions  . Chlorhexidine Gluconate Itching and Rash  . Codeine Nausea And Vomiting and Other (See Comments)    In high doses  . Lipitor [Atorvastatin] Nausea Only and Other (See Comments)    Nausea with high doses, tolerates 20mg  dose (08/22/16)  . Tape Other (See Comments)    Paper tape is ok    Patient Measurements: Height: 5\' 9"  (175.3 cm) Weight: 222 lb 3.6 oz (100.8 kg) IBW/kg (Calculated) : 70.7 Height: 5'9"  Vital Signs: Temp: 97.8 F (36.6 C) (04/10 0700) Temp Source: Oral (04/10 0700) Pulse Rate: 68 (04/10 0700)  Labs: Recent Labs    01/10/18 1115 01/11/18 0201  HGB 10.4* 9.8*  HCT 33.9* 30.3*  PLT 197 158  LABPROT 24.2* 24.3*  INR 2.20 2.21  CREATININE 1.43* 1.41*    Estimated Creatinine Clearance: 64.4 mL/min (A) (by C-G formula based on SCr of 1.41 mg/dL (H)).   Medical History: Past Medical History:  Diagnosis Date  . AICD (automatic cardioverter/defibrillator) present   . Anxiety   . ASCVD (arteriosclerotic cardiovascular disease)   . Chronic systolic CHF (congestive heart failure) ([place])   . COPD (chronic obstructive pulmonary disease) ([place])   . Coronary artery disease   . Depression   . GI bleed   . High cholesterol   . History of blood transfusion   . History of cocaine abuse   . Hypertension   . LVAD (left ventricular assist device) present ([place])   . Presence of permanent cardiac pacemaker   . Shortness of breath dyspnea   . Suicidal ideation   . Tobacco abuse   . Type II diabetes mellitus (HCC)     Medications:  Scheduled:  . amiodarone  200 mg Oral Daily  . citalopram  20 mg Oral Daily  . digoxin  0.125 mg Oral Daily  . docusate sodium  200 mg Oral QHS  . furosemide  80 mg Intravenous BID  . gabapentin  300 mg Oral BID  . insulin aspart  5 Units Subcutaneous TID WC  . insulin glargine  10 Units Subcutaneous Q2200  .  lactulose  30 g Oral BID  . lidocaine      . magnesium oxide  400 mg Oral Daily  . metolazone  2.5 mg Oral Daily  . midodrine  10 mg Oral TID WC  . multivitamin with minerals  1 tablet Oral Daily  . pantoprazole  40 mg Oral BID  . potassium chloride  40 mEq Oral BID  . spironolactone  12.5 mg Oral Daily  . sucralfate  1 g Oral TID WC & HS  . Warfarin - Pharmacist Dosing Inpatient   Does not apply q1800    Assessment: 33 yom admitted with acute encephalopathy after loss of dobutamine access, on warfarin PTA for hx of LVAD (HeartMate II).   Home regimen is 7.5 mg daily except 5 mg on Monday and Friday - last dose was on 4/8.   INR on admission was 2.2. INR today is stable at 2.21. Hgb is 9.8, plts 136. LDH is stable. No signs/symptoms of bleeding.   Goal of Therapy:  INR 1.8-2.4 Monitor platelets by anticoagulation protocol: Yes   Plan:  Order warfarin 7.5 mg tonight Monitor CBC, INR  Doylene Canard, PharmD Clinical Pharmacist  Pager: [phone] Clinical Phone for 01/11/2018 until 3:30pm: [phone] If after 3:30pm, please call main pharmacy at [phone] 01/11/2018,10:38 AM

## 2018-01-11 NOTE — Progress Notes (Signed)
Pt refused TED hose to be put on. Educated him the reason/purpose of these - pt still refused.

## 2018-01-11 NOTE — Progress Notes (Signed)
LVAD Coordinator Rounding Note:  Admitted 01/11/18 to Dr. Haroldine Laws due to acute encephalopathy and CHF with fluid overload. Home Dobutamine gtt disconnected.     HM III LVAD implanted on 09/09/16 by Dr. Cyndia Bent under Destination Therapy criteria.  Vital signs: Temp:  97.5 HR: 92 Doppler Pressure:  84 Automatic BP:  218/192 (201) - does not correlate with Doppler O2 Sat: 94% on 2 L/[place] Wt: 220>222 lbs   LVAD interrogation reveals:  Speed: 5800 Flow: 5.3 Power:  5.1 PI:  2.3 Alarms: none Events: 0-5 PI events daily Hematocrit:  36 Fixed speed:  5800 Low speed limit: 5500  Drive Line: left abdominal sorbaview dressing dry and intact; anchor intact and accurately applied. Bedside RN replaced this am.   Labs:  LDH trend: 159>136  INR trend: 2.2>2.21  Anticoagulation Plan: -INR Goal:  1.8 - 2.3 -ASA Dose: no ASA (history of GI bleeding)  Device: -Protect; end of service 08/2016 -Therapies:  Off Last check: complete interrogation 05/26/2017. DDD (lower rate 60); BiV pacing with 96% AS-VP  Adverse Events on VAD: 11/30/16> Milrinone gtt at discharge 01/27/17>dobutamine at 5 mcg/kg/mingtt at discharge 03/08/17> RV failure 04/29/17> symptomatic anemia and melena. EGD revealed stomach ulcers. Carafate added; ASA stopped 05/31/17>Ongoing symptomatic anemia and melena; capsule study mild antral gastritis; poss tiny AVM right colon. Lower MAPS, Losartan cut back. 06/2017> admit with syncope, AMS, Dobutamine at 7.60mcg 11/09/17>Marked ascites, RV failure, and hepatic encephalopathy. Paracentesis on 2/7 and 2/12 with >11 liters fluid removed 12/27/17>Outpatient paracentesis with 2.8 liters removed 01/11/18>IP paracentesis with 4.8 liters removed   Plan/Recommendations:  1. Will need weekly dressing changes; due today. Bedside RN may change dressing. 2. Bedside nurse may accompany patient to IR for paracentesis today. 2. Please call VAD pager with any VAD equipment or drive line  issues.  Zada Girt RN, VAD Coordiantor 24/7 VAD pager: [phone]

## 2018-01-11 NOTE — Procedures (Signed)
PROCEDURE SUMMARY:  Successful image-guided paracentesis from the left lateral abdomen.  Yielded 4.8 liters of yellow fluid.  No immediate complications.  Patient tolerated well.   Specimen was not sent for labs.  Alexandra Louk PA-C 01/11/2018 11:20 AM

## 2018-01-11 NOTE — Progress Notes (Addendum)
Advanced Heart Failure VAD Team Note  PCP-Cardiologist: Glori Bickers, MD   Subjective:    Admitted with massive volume overload and disconnected dobutamine line. PICC line appeared cut.   Started on IV lasix + metolazone. Urine output modest. Dobutamine was resumed at 7 mcg.   Feeling better today. Denies SOB. Weight still up about 30 pounds. Belly distended. Denies CP. + DOE   LVAD INTERROGATION:  HeartMate II LVAD:  Flow 5.5 liters/min, speed 5800, power 5, PI1.8    Objective:    Vital Signs:   Temp:  [97 F (36.1 C)-97.8 F (36.6 C)] 97.8 F (36.6 C) (04/10 0700) Pulse Rate:  [60-74] 68 (04/10 0700) Resp:  [16-23] 23 (04/10 0700) BP: (96)/(80) 96/80 (04/09 1533) SpO2:  [90 %-100 %] 90 % (04/10 0700) Weight:  [215 lb (97.5 kg)-222 lb 3.6 oz (100.8 kg)] 222 lb 3.6 oz (100.8 kg) (04/10 0543)   Mean arterial Pressure 80s   Intake/Output:   Intake/Output Summary (Last 24 hours) at 01/11/2018 0809 Last data filed at 01/11/2018 0400 Gross per 24 hour  Intake 1104.83 ml  Output 2400 ml  Net -1295.17 ml     Physical Exam   CVP 22 General:   No resp difficulty. Sitting on the side of the bed. Dishelved.  HEENT: normal anicteric Neck: supple. JVP to jaw  . Carotids 2+ bilat; no bruits. No lymphadenopathy or thyromegaly appreciated. Cor: Mechanical heart sounds with LVAD hum present. R upper chest tunneled PICC Lungs: clear no wheexe Abdomen: soft, nontender, markedly distended. No hepatosplenomegaly. No bruits or masses. Good bowel sounds. Driveline: C/D/I; securement device intact and driveline incorporated,  Extremities: no cyanosis, clubbing, rash, R and LLE 2+ edema Neuro: alert & orientedx3, cranial nerves grossly intact. moves all 4 extremities w/o difficulty. Affect pleasant   Telemetry   A sensed V paced 60-70s  EKG    n/a  Labs   Basic Metabolic Panel: Recent Labs  Lab 01/10/18 1115 01/11/18 0201  NA 129* 125*  K 2.9* 3.5  CL 85* 82*  CO2  31 29  GLUCOSE 51* 166*  BUN 47* 45*  CREATININE 1.43* 1.41*  CALCIUM 8.7* 8.2*    Liver Function Tests: Recent Labs  Lab 01/10/18 1115  AST 39  ALT 24  ALKPHOS 201*  BILITOT 1.3*  PROT 7.3  ALBUMIN 3.1*   No results for input(s): LIPASE, AMYLASE in the last 168 hours. Recent Labs  Lab 01/05/18 1217 01/10/18 1115  AMMONIA 82* 72*    CBC: Recent Labs  Lab 01/10/18 1115 01/11/18 0201  WBC 10.2 8.0  HGB 10.4* 9.8*  HCT 33.9* 30.3*  MCV 80.3 79.3  PLT 197 158    INR: Recent Labs  Lab 01/05/18 1153 01/10/18 1115 01/11/18 0201  INR 2.40 2.20 2.21    Other results:  EKG:    Imaging    No results found.   Medications:     Scheduled Medications: . amiodarone  200 mg Oral Daily  . citalopram  20 mg Oral Daily  . digoxin  0.125 mg Oral Daily  . docusate sodium  200 mg Oral QHS  . furosemide  80 mg Intravenous BID  . gabapentin  300 mg Oral BID  . insulin aspart  5 Units Subcutaneous TID WC  . insulin glargine  10 Units Subcutaneous Q2200  . lactulose  30 g Oral BID  . magnesium oxide  400 mg Oral Daily  . midodrine  10 mg Oral TID WC  . multivitamin with  minerals  1 tablet Oral Daily  . pantoprazole  40 mg Oral BID  . potassium chloride  40 mEq Oral BID  . spironolactone  12.5 mg Oral Daily  . sucralfate  1 g Oral TID WC & HS  . Warfarin - Pharmacist Dosing Inpatient   Does not apply q1800     Infusions: . DOBUTamine 7.5 mcg/kg/min (01/11/18 0400)     PRN Medications:  acetaminophen, cyclobenzaprine, ondansetron (ZOFRAN) IV, ondansetron, senna-docusate, sodium chloride flush, traZODone   Patient Profile    Ronald Miller. is a 62 y.o. male  with history of chronic systolic HF s/p Medtronic ICD 2014, CAD s/p CABG in 2000, HTN, Hx of cocaine abuse, Tobacco abuse, Depression, PAF, and COPD. HM3 placed 09/09/2016.   Admitted from clinic with encephalopathy and acute on chronic CHF. Dobutamine disconnected sometime overnight.      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 Has Medtronic ICD.  - Admitted with volume overload and acute encephalopathy.  - CVP 22. Continue IV lasix. Follow CVP and Co[place].  - Volume status markedly elevated on exam - Start lasix 80 mg IV BID and + metolazone 2.5 mg daily.   - Continue dobutamine 7.5 mcg/kg/min. (On chronically for RV failure) - Continue mididrine 10 mg tid now. - Not a good heart transplant candidate. Has reached end stage biventricular failure. May need to address [place] once his confusion has improved.  2. CAD:  - Stable.  - No s/s of ischemia.    3. DMII:  - Will use home regimen plus sliding scale.  4. PAF: ?underlying atrial rhythm - EKG 11/13/17 with profound artifact.  5. Anemia, iron deficiency: Had capsule endoscopy 04/2017 with severe gastritis. Continue carafate.  -Hgb stable 9.8 n  6. Anxiety/Depression  - Continue celexa. Stable.  7. H/O RUE DVT:  - On coumadin. INR pending. Dosing per Pharm D.  8. Chronic Anticoagulation: On coumadin, INR goal 1.8 - 2.4.  - INR 2.2  -Pharmacy dosing coumadin.  9. Cardiac cirrhosis with ascites:  - Had Paracentesis 12/27/17 with 2.8 L.   (Had total of nearly 10 out last admission, and belly remains markedly distended) - Plan for paracentesis today.   10. Confusion/Hepatic Encephalopathy - Ammonia 58 11/15/17. + [place] on exam.  Ammonia 72 on admit.  - Contiue lactulose 11. Hyponatremia  -sodium 125. Restrict free water.    I reviewed the LVAD parameters from today, and compared the results to the patient's prior recorded data.  No programming changes were made.  The LVAD is functioning within specified parameters.  The patient performs LVAD self-test daily.  LVAD interrogation was negative for any significant power changes, alarms or PI events/speed drops.  LVAD equipment check completed and is in good working order.  Back-up equipment present.   LVAD education  done on emergency procedures and precautions and reviewed exit site care.  Length of Stay: 1  Amy Clegg, NP 01/11/2018, 8:09 AM  VAD Team --- VAD ISSUES ONLY--- Pager [phone] (7am - 7am)  Advanced Heart Failure Team  Pager [phone] (M-F; 7a - 4p)  Please contact [facility] for night-coverage after hours (4p -7a ) and weekends on [email or web address]  Patient seen and examined with Ronald Grinder, NP. We discussed all aspects of the encounter. I agree with the assessment and plan as stated above.   He is massively volume overloaded with marked ascites on exam even after 4.8L paracentesis today. JVP > 20. Ammonia coming down and  seems less encephalopathic. Serum sodium 125. If falls may need a dose of tolvaptan. Continue IV diuresis. Will likely need repeat paracentesis. Continue dobutamine for RV support. VAD interrogated personally. Parameters stable.  Glori Bickers, MD  8:51 PM

## 2018-01-12 ENCOUNTER — Encounter (HOSPITAL_COMMUNITY): Payer: Self-pay

## 2018-01-12 LAB — GLUCOSE, CAPILLARY
GLUCOSE-CAPILLARY: 167 mg/dL — AB (ref 65–99)
Glucose-Capillary: 163 mg/dL — ABNORMAL HIGH (ref 65–99)
Glucose-Capillary: 291 mg/dL — ABNORMAL HIGH (ref 65–99)
Glucose-Capillary: 184 mg/dL — ABNORMAL HIGH (ref 65–99)

## 2018-01-12 LAB — BASIC METABOLIC PANEL
ANION GAP: 11 (ref 5–15)
BUN: 39 mg/dL — AB (ref 6–20)
CHLORIDE: 85 mmol/L — AB (ref 101–111)
CO2: 33 mmol/L — ABNORMAL HIGH (ref 22–32)
Calcium: 8.3 mg/dL — ABNORMAL LOW (ref 8.9–10.3)
Creatinine, Ser: 1.23 mg/dL (ref 0.61–1.24)
GFR calc non Af Amer: >60 mL/min (ref >60)
Glucose, Bld: 166 mg/dL — ABNORMAL HIGH (ref 65–99)
POTASSIUM: 3.8 mmol/L (ref 3.5–5.1)
SODIUM: 129 mmol/L — AB (ref 135–145)

## 2018-01-12 LAB — CBC
HEMATOCRIT: 31.2 % — AB (ref 39.0–52.0)
Hemoglobin: 9.5 g/dL — ABNORMAL LOW (ref 13.0–17.0)
MCH: 24.8 pg — ABNORMAL LOW (ref 26.0–34.0)
MCHC: 30.4 g/dL (ref 30.0–36.0)
MCV: 81.5 fL (ref 78.0–100.0)
Platelets: 153 10*3/uL (ref 150–400)
RBC: 3.83 MIL/uL — AB (ref 4.22–5.81)
RDW: 21 % — AB (ref 11.5–15.5)
WBC: 7.7 10*3/uL (ref 4.0–10.5)

## 2018-01-12 LAB — LACTATE DEHYDROGENASE: LDH: 140 U/L (ref 98–192)

## 2018-01-12 LAB — PROTIME-INR
INR: 2.82
Prothrombin Time: 29.4 seconds — ABNORMAL HIGH (ref 11.4–15.2)

## 2018-01-12 MED ORDER — WARFARIN SODIUM 2 MG PO TABS
2.0000 mg | ORAL_TABLET | Freq: Once | ORAL | Status: AC
  Administered 2018-01-12: 2 mg via ORAL
  Filled 2018-01-12: qty 1

## 2018-01-12 MED ORDER — FUROSEMIDE 10 MG/ML IJ SOLN
30.0000 mg/h | INTRAMUSCULAR | Status: DC
  Administered 2018-01-12 – 2018-01-16 (×7): 20 mg/h via INTRAVENOUS
  Administered 2018-01-17: 30 mg/h via INTRAVENOUS
  Filled 2018-01-12 (×2): qty 25
  Filled 2018-01-12: qty 5
  Filled 2018-01-12 (×5): qty 25
  Filled 2018-01-12: qty 21
  Filled 2018-01-12 (×4): qty 25

## 2018-01-12 MED ORDER — METOLAZONE 2.5 MG PO TABS
2.5000 mg | ORAL_TABLET | Freq: Two times a day (BID) | ORAL | Status: DC
  Administered 2018-01-12 – 2018-01-16 (×10): 2.5 mg via ORAL
  Filled 2018-01-12 (×10): qty 1

## 2018-01-12 MED ORDER — WARFARIN SODIUM 2.5 MG PO TABS: 2.5000 mg | ORAL_TABLET | Freq: Once | ORAL | Status: DC

## 2018-01-12 NOTE — Progress Notes (Addendum)
[place] for warfarin Indication: LVAD  Allergies  Allergen Reactions  . Chlorhexidine Gluconate Itching and Rash  . Codeine Nausea And Vomiting and Other (See Comments)    In high doses  . Lipitor [Atorvastatin] Nausea Only and Other (See Comments)    Nausea with high doses, tolerates 20mg  dose (08/22/16)  . Tape Other (See Comments)    Paper tape is ok    Patient Measurements: Height: 5\' 9"  (175.3 cm) Weight: 213 lb 6.5 oz (96.8 kg)(scale b ) IBW/kg (Calculated) : 70.7 Height: 5'9"  Vital Signs: Temp: 97.8 F (36.6 C) (04/11 0846) Temp Source: Oral (04/11 0846) BP: 81/57 (04/11 0846) Pulse Rate: 98 (04/11 0846)  Labs: Recent Labs    01/10/18 1115 01/11/18 0201 01/12/18 0418  HGB 10.4* 9.8* 9.5*  HCT 33.9* 30.3* 31.2*  PLT 197 158 153  LABPROT 24.2* 24.3* 29.4*  INR 2.20 2.21 2.82  CREATININE 1.43* 1.41* 1.23    Estimated Creatinine Clearance: 72.3 mL/min (by C-G formula based on SCr of 1.23 mg/dL).   Medical History: Past Medical History:  Diagnosis Date  . AICD (automatic cardioverter/defibrillator) present   . Anxiety   . ASCVD (arteriosclerotic cardiovascular disease)   . Chronic systolic CHF (congestive heart failure) ([place])   . COPD (chronic obstructive pulmonary disease) ([place])   . Coronary artery disease   . Depression   . GI bleed   . High cholesterol   . History of blood transfusion   . History of cocaine abuse   . Hypertension   . LVAD (left ventricular assist device) present ([place])   . Presence of permanent cardiac pacemaker   . Shortness of breath dyspnea   . Suicidal ideation   . Tobacco abuse   . Type II diabetes mellitus (H[facility])     Medications:  Scheduled:  . amiodarone  200 mg Oral Daily  . citalopram  20 mg Oral Daily  . digoxin  0.125 mg Oral Daily  . docusate sodium  200 mg Oral QHS  . furosemide  80 mg Intravenous BID  . gabapentin  300 mg Oral BID  . insulin aspart  5 Units  Subcutaneous TID WC  . insulin glargine  10 Units Subcutaneous Q2200  . lactulose  30 g Oral BID  . magnesium oxide  400 mg Oral Daily  . metolazone  2.5 mg Oral Daily  . midodrine  10 mg Oral TID WC  . multivitamin with minerals  1 tablet Oral Daily  . pantoprazole  40 mg Oral BID  . potassium chloride  40 mEq Oral BID  . spironolactone  12.5 mg Oral Daily  . sucralfate  1 g Oral TID WC & HS  . Warfarin - Pharmacist Dosing Inpatient   Does not apply q1800    Assessment: 68 yom admitted with acute encephalopathy after loss of dobutamine access, on warfarin PTA for hx of LVAD (HeartMate II).   Home regimen is 7.5 mg daily except 5 mg on Monday and Friday - last dose was on 4/8.   INR on admission was 2.2. INR today is stable at 2.82, supratherapeutic. Hgb is 9.5, plts 153. LDH is stable. No signs/symptoms of bleeding.   Goal of Therapy:  INR 1.8-2.4 Monitor platelets by anticoagulation protocol: Yes   Plan:  Order warfarin 2 mg tonight Monitor CBC, INR  Doylene Canard, PharmD Clinical Pharmacist  Pager: [phone] Clinical Phone for 01/12/2018 until 3:30pm: [phone] If after 3:30pm, please call main pharmacy at [phone] 01/12/2018,9:46  AM

## 2018-01-12 NOTE — Care Management Note (Signed)
Case Management Note  Patient Details  Name: Bhavik Cabiness. MRN: [id number] Date of Birth: November 27, 1955  Subjective/Objective:     Pt admitted with fluid overload - pt is a home dobutamine pt and drip was disconnected                Action/Plan:  PTA independent from home active with [facility] for [place] [facility] and dobutamine infusion - [facility] will resume services at discharge   Expected Discharge Date:                  Expected Discharge Plan:  [place]  In-House Referral:     Discharge planning Services  CM Consult  Post Acute Care Choice:    Choice offered to:  Patient  DME Arranged:    DME Agency:  [place]:  RN [facility] Agency:     Status of Service:     If discussed at [employer], dates discussed:    Additional Comments:  Maryclare Labrador, RN 01/12/2018, 2:32 PM

## 2018-01-12 NOTE — Progress Notes (Addendum)
Advanced Heart Failure VAD Team Note  PCP-Cardiologist: Glori Bickers, MD   Subjective:    Admitted with massive volume overload and disconnected dobutamine line. PICC line appeared cut.   Started on IV lasix + metolazone. Urine output modest. Dobutamine was resumed at 7 mcg.   Parecentesis 01/11/18 with 4.8 L off.   Feeling only slightly better. Remains fatigued and edematous. Very bloated and weak. No CP. SOB with mild activity. Mild lightheadedness with standing.   Auto cuff with MAPs in 60s. Doppler by RN and VAD coordinator in [phone].   LVAD INTERROGATION:  HeartMate II LVAD:  Flow 5.4 liters/min, speed 5800, power 4.0, PI 1.9. No PI events.   Objective:    Vital Signs:   Temp:  [97.4 F (36.3 C)-98.2 F (36.8 C)] 97.8 F (36.6 C) (04/11 1204) Pulse Rate:  [37-119] 98 (04/11 0846) Resp:  [12-21] 16 (04/11 1204) BP: (81-156)/(34-130) 156/130 (04/11 1204) SpO2:  [87 %-93 %] 87 % (04/11 0846) Weight:  [213 lb 6.5 oz (96.8 kg)] 213 lb 6.5 oz (96.8 kg) (04/11 0400) Last BM Date: 01/10/18 Mean arterial Pressure 80s by doppler.   Intake/Output:   Intake/Output Summary (Last 24 hours) at 01/12/2018 1216 Last data filed at 01/12/2018 1205 Gross per 24 hour  Intake 1724 ml  Output 3175 ml  Net -1451 ml     Physical Exam   GENERAL: Fatigued. Disheveled. Sitting on side of bed with gown mostly off. HEENT: Normal. anicteric NECK: Supple, JVP to jaw. Carotids OK.  CARDIAC:  Mechanical heart sounds with LVAD hum present. R upper tunneled chest PICC LUNGS:  CTAB, normal effort. No wheeze ABDOMEN: ++ distended. No HSM. No bruits or masses. +BS  LVAD exit site: Well-healed and incorporated. Dressing dry and intact. No erythema or drainage. Stabilization device present and accurately applied. Driveline dressing changed daily per sterile technique. EXTREMITIES:  Warm and dry. No cyanosis, clubbing, or rash. 2-3+ edema. NEUROLOGIC:  Alert & oriented x 3. Cranial nerves grossly  intact. Moves all 4 extremities w/o difficulty. Affect flat.   Telemetry   A sensed V paced, 60-70s, personally reviewed.   EKG    No new tracings.    Labs   Basic Metabolic Panel: Recent Labs  Lab 01/10/18 1115 01/11/18 0201 01/12/18 0418  NA 129* 125* 129*  K 2.9* 3.5 3.8  CL 85* 82* 85*  CO2 31 29 33*  GLUCOSE 51* 166* 166*  BUN 47* 45* 39*  CREATININE 1.43* 1.41* 1.23  CALCIUM 8.7* 8.2* 8.3*    Liver Function Tests: Recent Labs  Lab 01/10/18 1115  AST 39  ALT 24  ALKPHOS 201*  BILITOT 1.3*  PROT 7.3  ALBUMIN 3.1*   No results for input(s): LIPASE, AMYLASE in the last 168 hours. Recent Labs  Lab 01/05/18 1217 01/10/18 1115  AMMONIA 82* 72*    CBC: Recent Labs  Lab 01/10/18 1115 01/11/18 0201 01/12/18 0418  WBC 10.2 8.0 7.7  HGB 10.4* 9.8* 9.5*  HCT 33.9* 30.3* 31.2*  MCV 80.3 79.3 81.5  PLT 197 158 153    INR: Recent Labs  Lab 01/10/18 1115 01/11/18 0201 01/12/18 0418  INR 2.20 2.21 2.82    Other results:    Imaging   Ir Paracentesis  Result Date: 01/11/2018 INDICATION: Heart failure with LVAD in place. Recurrent ascites. Request for therapeutic paracentesis. EXAM: ULTRASOUND GUIDED THERAPEUTIC PARACENTESIS MEDICATIONS: 10 mL of 2% lidocaine COMPLICATIONS: None immediate. PROCEDURE: Informed written consent was obtained from the patient after a discussion  of the risks, benefits and alternatives to treatment. A timeout was performed prior to the initiation of the procedure. Initial ultrasound scanning demonstrates a large amount of ascites within the left lower abdominal quadrant. The left lower abdomen was prepped and draped in the usual sterile fashion. 2% lidocaine was used for local anesthesia. Following this, a 19 gauge, 7-cm, Yueh catheter was introduced. An ultrasound image was saved for documentation purposes. The paracentesis was performed. The catheter was removed and a dressing was applied. The patient tolerated the procedure  well without immediate post procedural complication. FINDINGS: A total of approximately 4.8 liters of yellow fluid was removed. IMPRESSION: Successful ultrasound-guided therapeutic paracentesis yielding 4.8 liters of peritoneal fluid. Read by: Earley Abide, PA-C Electronically Signed   By: Jacqulynn Cadet M.D.   On: 01/11/2018 11:25     Medications:     Scheduled Medications: . amiodarone  200 mg Oral Daily  . citalopram  20 mg Oral Daily  . digoxin  0.125 mg Oral Daily  . docusate sodium  200 mg Oral QHS  . gabapentin  300 mg Oral BID  . insulin aspart  5 Units Subcutaneous TID WC  . insulin glargine  10 Units Subcutaneous Q2200  . lactulose  30 g Oral BID  . magnesium oxide  400 mg Oral Daily  . metolazone  2.5 mg Oral BID  . midodrine  10 mg Oral TID WC  . multivitamin with minerals  1 tablet Oral Daily  . pantoprazole  40 mg Oral BID  . potassium chloride  40 mEq Oral BID  . spironolactone  12.5 mg Oral Daily  . sucralfate  1 g Oral TID WC & HS  . warfarin  2 mg Oral ONCE-1800  . Warfarin - Pharmacist Dosing Inpatient   Does not apply q1800    Infusions: . DOBUTamine 7.5 mcg/kg/min (01/12/18 0700)  . furosemide (LASIX) infusion 20 mg/hr (01/12/18 1135)    PRN Medications: acetaminophen, cyclobenzaprine, lidocaine, ondansetron (ZOFRAN) IV, ondansetron, senna-docusate, sodium chloride flush, traZODone   Patient Profile    Ronald Miller. is a 62 y.o. male  with history of chronic systolic HF s/p Medtronic ICD 2014, CAD s/p CABG in 2000, HTN, Hx of cocaine abuse, Tobacco abuse, Depression, PAF, and COPD. HM3 placed 09/09/2016.   Admitted from clinic with encephalopathy and acute on chronic CHF. Dobutamine disconnected sometime overnight.     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 Has Medtronic ICD.  - Admitted with volume overload and acute encephalopathy.  - CVP remains 17-18. - Change lasix to gtt at 20  mg/hr. Give metolazone 2.5 mg BID.  - Continue dobutamine 7.5 mcg/kg/min. (On chronically for RV failure) - Continue mididrine 10 mg tid. - Not a good heart transplant candidate. Has reached end stage biventricular failure. May need to address [place] once his confusion has improved.  2. CAD:  - Stable. No s/s of ischemia.    3. DMII:  - SSI.  4. PAF:  - EKG 11/13/17 with profound artifact.  - Stable. A sensed, NSR currently.  5. Anemia, iron deficiency: Had capsule endoscopy 04/2017 with severe gastritis. Continue carafate.  -Hgb stable 9.5. No bleeding.  6. Anxiety/Depression  - Continue celexa. Stable.   7. H/O RUE DVT:  - On coumadin. INR 2.82. Dosing per Pharm D.  8. Chronic Anticoagulation: On coumadin, INR goal 1.8 - 2.4.  - INR 2.8. Discussed dosing with pharmacy.  9. Cardiac cirrhosis with ascites:  -  Had Paracentesis yesterday with 4.8 L out. Will continue to diurese and then repeat.  10. Confusion/Hepatic Encephalopathy - Ammonia 58 11/15/17. + Asterixis on exam.  - Ammonia 72 on admit. Recheck tomorrow. Continue lactulose 11. Hyponatremia  - Na up to 129. Restrict free water.   I reviewed the LVAD parameters from today, and compared the results to the patient's prior recorded data.  No programming changes were made.  The LVAD is functioning within specified parameters.  The patient performs LVAD self-test daily.  LVAD interrogation was negative for any significant power changes, alarms or PI events/speed drops.  LVAD equipment check completed and is in good working order.  Back-up equipment present.   LVAD education done on emergency procedures and precautions and reviewed exit site care.   Length of Stay: 2  Annamaria Helling 01/12/2018, 12:16 PM  VAD Team --- VAD ISSUES ONLY--- Pager [phone] (7am - 7am)  Advanced Heart Failure Team  Pager [phone] (M-F; 7a - 4p)  Please contact [facility] for night-coverage after hours (4p -7a ) and weekends on  [email or web address]  Patient seen and examined with the above-signed Advanced Practice Provider and/or Housestaff. I personally reviewed laboratory data, imaging studies and relevant notes. I independently examined the patient and formulated the important aspects of the plan. I have edited the note to reflect any of my changes or salient points. I have personally discussed the plan with the patient and/or family.  He remains massively volume overloaded even with IV lasix and paracentesis. BP soft despite dobutamine and milrinone. Will start lasix gtt. Will continue metolazone and dobutamine. Sodium improved. Seems a bit confused. Recheck ammonia in am. VAD interrogated personally. Parameters stable. INR 2.8. Suspect it will drop with treatment of HF. Discussed dosing with PharmD personally.   Glori Bickers, MD  8:23 PM

## 2018-01-12 NOTE — Progress Notes (Signed)
LVAD Coordinator Rounding Note:  Admitted 01/11/18 to Dr. Haroldine Laws due to acute encephalopathy and CHF with fluid overload. Home Dobutamine gtt disconnected.     HM III LVAD implanted on 09/09/16 by Dr. Cyndia Bent under Destination Therapy criteria.  Vital signs: Temp:  97.8 HR: 60 AV paced Doppler Pressure:  80 Automatic BP: does not correlate with Doppler O2 Sat: 94% on RA Wt: 220>222>213 lbs  LVAD interrogation reveals:  Speed: 5800 Flow: 5.4 Power:  4.5w PI:  2.0 Alarms: none Events: none Hematocrit:  36 Fixed speed:  5800 Low speed limit: 5500  Drive Line: left abdominal sorbaview dressing dry and intact; anchor intact and accurately applied. Change weekly; due Wed April 17  Labs:  LDH trend: 159>136>140  INR trend: 2.2>2.21>2.82  Anticoagulation Plan: -INR Goal:  1.8 - 2.3 -ASA Dose: no ASA (history of GI bleeding)  Device: -Protect; end of service 08/2016 -Therapies:  Off Last check: complete interrogation 05/26/2017. DDD (lower rate 60); BiV pacing with 96% AS-VP  Adverse Events on VAD: 11/30/16> Milrinone gtt at discharge 01/27/17>dobutamine at 5 mcg/kg/mingtt at discharge 03/08/17> RV failure 04/29/17> symptomatic anemia and melena. EGD revealed stomach ulcers. Carafate added; ASA stopped 05/31/17>Ongoing symptomatic anemia and melena; capsule study mild antral gastritis; poss tiny AVM right colon. Lower MAPS, Losartan cut back. 06/2017> admit with syncope, AMS, Dobutamine at 7.42mcg 11/09/17>Marked ascites, RV failure, and hepatic encephalopathy. Paracentesis on 2/7 and 2/12 with >11 liters fluid removed 12/27/17>Outpatient paracentesis with 2.8 liters removed 01/11/18>IP paracentesis with 4.8 liters removed   Plan/Recommendations:  1. Will need weekly dressing changes; due Wed, April 17.. Bedside RN may change dressing. 2. Please call VAD pager with any VAD equipment or drive line issues.  Zada Girt RN, VAD Coordiantor 24/7 VAD pager: [phone]

## 2018-01-13 DIAGNOSIS — I50810 Right heart failure, unspecified: Secondary | ICD-10-CM

## 2018-01-13 DIAGNOSIS — R188 Other ascites: Secondary | ICD-10-CM

## 2018-01-13 LAB — SODIUM: Sodium: 122 mmol/L — ABNORMAL LOW (ref 135–145)

## 2018-01-13 LAB — CBC
HEMATOCRIT: 30.5 % — AB (ref 39.0–52.0)
HEMOGLOBIN: 9.4 g/dL — AB (ref 13.0–17.0)
MCH: 24.7 pg — ABNORMAL LOW (ref 26.0–34.0)
MCHC: 30.8 g/dL (ref 30.0–36.0)
MCV: 80.1 fL (ref 78.0–100.0)
Platelets: 164 10*3/uL (ref 150–400)
RBC: 3.81 MIL/uL — ABNORMAL LOW (ref 4.22–5.81)
RDW: 20.5 % — ABNORMAL HIGH (ref 11.5–15.5)
WBC: 7.5 10*3/uL (ref 4.0–10.5)

## 2018-01-13 LAB — BASIC METABOLIC PANEL
ANION GAP: 11 (ref 5–15)
BUN: 36 mg/dL — ABNORMAL HIGH (ref 6–20)
CO2: 33 mmol/L — ABNORMAL HIGH (ref 22–32)
Calcium: 8.1 mg/dL — ABNORMAL LOW (ref 8.9–10.3)
Chloride: 79 mmol/L — ABNORMAL LOW (ref 101–111)
Creatinine, Ser: 1.26 mg/dL — ABNORMAL HIGH (ref 0.61–1.24)
GFR, EST NON AFRICAN AMERICAN: 60 mL/min — AB (ref >60)
Glucose, Bld: 162 mg/dL — ABNORMAL HIGH (ref 65–99)
POTASSIUM: 3.5 mmol/L (ref 3.5–5.1)
SODIUM: 123 mmol/L — AB (ref 135–145)

## 2018-01-13 LAB — PROTIME-INR
INR: 3.04
Prothrombin Time: 31.2 seconds — ABNORMAL HIGH (ref 11.4–15.2)

## 2018-01-13 LAB — AMMONIA: AMMONIA: 80 umol/L — AB (ref 9–35)

## 2018-01-13 LAB — GLUCOSE, CAPILLARY
GLUCOSE-CAPILLARY: 172 mg/dL — AB (ref 65–99)
GLUCOSE-CAPILLARY: 134 mg/dL — AB (ref 65–99)
Glucose-Capillary: 215 mg/dL — ABNORMAL HIGH (ref 65–99)
Glucose-Capillary: 137 mg/dL — ABNORMAL HIGH (ref 65–99)

## 2018-01-13 LAB — LACTATE DEHYDROGENASE: LDH: 141 U/L (ref 98–192)

## 2018-01-13 IMAGING — NM NM PULMONARY VENT & PERF
12 series · 12 of 12 positions shown · non-contrast
Comparison: Two-view chest x-ray 08/22/2016.

CLINICAL DATA: Congestive heart failure.

EXAM:
NUCLEAR MEDICINE VENTILATION - PERFUSION LUNG SCAN
TECHNIQUE: Ventilation images were obtained in multiple projections using
inhaled aerosol 3c-22m DTPA. Perfusion images were obtained in
multiple projections after intravenous injection of 3c-22m MAA.
RADIOPHARMACEUTICALS:  31.0 mCi 4echnetium-DDm DTPA aerosol
inhalation and 4 point Moriya mCi 4echnetium-DDm MAA IV

[Series 1: ant/post vent · 4.14mm/px · 1 of 1 slices shown (1 of 2)]
[im 1/1]
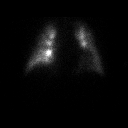

[Series 1: ant/post vent · 4.14mm/px · 1 of 1 slices shown (2 of 2)]
[im 1/1]
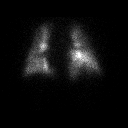

[Series 2: lao/rpo vent · 4.14mm/px · 1 of 1 slices shown (1 of 2)]
[im 1/1]
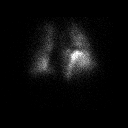

[Series 2: lao/rpo vent · 4.14mm/px · 1 of 1 slices shown (2 of 2)]
[im 1/1]
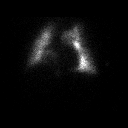

[Series 3: lpo/rao vent · 4.14mm/px · 1 of 1 slices shown (1 of 2)]
[im 1/1]
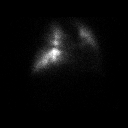

[Series 3: lpo/rao vent · 4.14mm/px · 1 of 1 slices shown (2 of 2)]
[im 1/1]
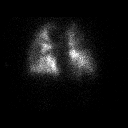

[Series 4: lpo/rao perf · 4.14mm/px · 1 of 1 slices shown (1 of 2)]
[im 1/1]
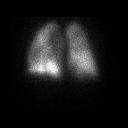

[Series 4: lpo/rao perf · 4.14mm/px · 1 of 1 slices shown (2 of 2)]
[im 1/1]
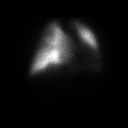

[Series 5: ant/post perf · 4.14mm/px · 1 of 1 slices shown (1 of 2)]
[im 1/1]
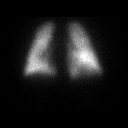

[Series 5: ant/post perf · 4.14mm/px · 1 of 1 slices shown (2 of 2)]
[im 1/1]
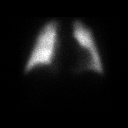

[Series 6: lao/rpo perf · 4.14mm/px · 1 of 1 slices shown (1 of 2)]
[im 1/1]
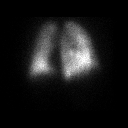

[Series 6: lao/rpo perf · 4.14mm/px · 1 of 1 slices shown (2 of 2)]
[im 1/1]
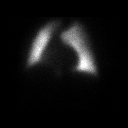

[12 of 12 positions shown; findings below may reference images not displayed]

FINDINGS: Ventilation: There is some clumping of radiopharmaceutical at the
hila bilaterally. The lungs otherwise and slight homogeneously.

Perfusion: No significant perfusion defects are present.
Cardiomegaly is stable.
IMPRESSION: 1. Normal scan.
2. Cardiomegaly.

## 2018-01-13 MED ORDER — TOLVAPTAN 15 MG PO TABS
15.0000 mg | ORAL_TABLET | Freq: Once | ORAL | Status: AC
  Administered 2018-01-13: 15 mg via ORAL
  Filled 2018-01-13: qty 1

## 2018-01-13 MED ORDER — LACTULOSE 10 GM/15ML PO SOLN
30.0000 g | Freq: Three times a day (TID) | ORAL | Status: DC
  Administered 2018-01-13 – 2018-01-22 (×26): 30 g via ORAL
  Filled 2018-01-13 (×26): qty 45

## 2018-01-13 NOTE — Progress Notes (Signed)
[place] for warfarin Indication: LVAD  Allergies  Allergen Reactions  . Chlorhexidine Gluconate Itching and Rash  . Codeine Nausea And Vomiting and Other (See Comments)    In high doses  . Lipitor [Atorvastatin] Nausea Only and Other (See Comments)    Nausea with high doses, tolerates 20mg  dose (08/22/16)  . Tape Other (See Comments)    Paper tape is ok    Patient Measurements: Height: 5\' 9"  (175.3 cm) Weight: 208 lb (94.3 kg) IBW/kg (Calculated) : 70.7 Height: 5'9"  Vital Signs: Temp: 98 F (36.7 C) (04/12 0727) Temp Source: Oral (04/12 0727) BP: 98/60 (04/12 0727) Pulse Rate: 63 (04/12 0727)  Labs: Recent Labs    01/11/18 0201 01/12/18 0418 01/13/18 0510  HGB 9.8* 9.5* 9.4*  HCT 30.3* 31.2* 30.5*  PLT 158 153 164  LABPROT 24.3* 29.4* 31.2*  INR 2.21 2.82 3.04  CREATININE 1.41* 1.23 1.26*    Estimated Creatinine Clearance: 69.8 mL/min (A) (by C-G formula based on SCr of 1.26 mg/dL (H)).   Medical History: Past Medical History:  Diagnosis Date  . AICD (automatic cardioverter/defibrillator) present   . Anxiety   . ASCVD (arteriosclerotic cardiovascular disease)   . Chronic systolic CHF (congestive heart failure) ([place])   . COPD (chronic obstructive pulmonary disease) ([place])   . Coronary artery disease   . Depression   . GI bleed   . High cholesterol   . History of blood transfusion   . History of cocaine abuse   . Hypertension   . LVAD (left ventricular assist device) present ([place])   . Presence of permanent cardiac pacemaker   . Shortness of breath dyspnea   . Suicidal ideation   . Tobacco abuse   . Type II diabetes mellitus ([facility])     Medications:  Scheduled:  . amiodarone  200 mg Oral Daily  . citalopram  20 mg Oral Daily  . digoxin  0.125 mg Oral Daily  . docusate sodium  200 mg Oral QHS  . gabapentin  300 mg Oral BID  . insulin aspart  5 Units Subcutaneous TID WC  . insulin glargine  10 Units  Subcutaneous Q2200  . lactulose  30 g Oral BID  . magnesium oxide  400 mg Oral Daily  . metolazone  2.5 mg Oral BID  . midodrine  10 mg Oral TID WC  . multivitamin with minerals  1 tablet Oral Daily  . pantoprazole  40 mg Oral BID  . potassium chloride  40 mEq Oral BID  . spironolactone  12.5 mg Oral Daily  . sucralfate  1 g Oral TID WC & HS  . tolvaptan  15 mg Oral Once  . Warfarin - Pharmacist Dosing Inpatient   Does not apply q1800    Assessment: 54 yom admitted with acute encephalopathy after loss of dobutamine access, on warfarin PTA for hx of LVAD (HeartMate II).   Home regimen is 7.5 mg daily except 5 mg on Monday and Friday - last dose was on 4/8.   INR on admission was 2.2. INR today is up to 3.04, supratherapeutic. CBC and LDH stable. No signs/symptoms of bleeding.   Goal of Therapy:  INR 1.8-2.4 Monitor platelets by anticoagulation protocol: Yes   Plan:  Hold Coumadin tonight. Monitor CBC, INR  Uvaldo Rising, BCPS  Clinical Pharmacist Pager [phone]  01/13/2018 8:18 AM

## 2018-01-13 NOTE — Progress Notes (Addendum)
Advanced Heart Failure VAD Team Note  PCP-Cardiologist: Glori Bickers, MD   Subjective:    Admitted with massive volume overload and disconnected dobutamine line. PICC line appeared cut.   Parecentesis 01/11/18 with 4.8 L off.   On dobutamine 7.5 mcg chronically. Diuresing with lasix drip at 20 mg per hour + metolazone twice a day. Negative 2.5 liters.  Weight down 5 pounds. LDH stable.   Had a hard time sleeping last night. Denies SOB. Weak.  INR 3.0  Confused at times. NH3 = 80  LVAD INTERROGATION:  HeartMate II LVAD:  Flow 5.5  liters/min, speed 5800, power 5, PI 2 No PI events.   Objective:    Vital Signs:   Temp:  [97.4 F (36.3 C)-98 F (36.7 C)] 98 F (36.7 C) (04/12 0727) Pulse Rate:  [63-100] 63 (04/12 0727) Resp:  [12-21] 15 (04/12 0727) BP: (81-156)/(57-130) 98/60 (04/12 0727) SpO2:  [84 %-99 %] 89 % (04/12 0727) Weight:  [208 lb (94.3 kg)] 208 lb (94.3 kg) (04/12 0500) Last BM Date: 01/10/18 Mean arterial Pressure 80s  Intake/Output:   Intake/Output Summary (Last 24 hours) at 01/13/2018 0747 Last data filed at 01/13/2018 0700 Gross per 24 hour  Intake 2586.33 ml  Output 4950 ml  Net -2363.67 ml     Physical Exam   Physical Exam: CVP 22  GENERAL: NAD in bed. HEENT: normal  anicteric NECK: Supple, JVP to jaw .  2+ bilaterally, no bruits.  No lymphadenopathy or thyromegaly appreciated.   CARDIAC:  Mechanical heart sounds with LVAD hum present.  LUNGS:  Clear to auscultation bilaterally. Dull at bases  ABDOMEN:  Obese Soft, round, nontender, positive bowel sounds x4.     LVAD exit site: well-healed and incorporated.  Dressing dry and intact.  No erythema or drainage.  Stabilization device is not present. EXTREMITIES:  Warm and dry, no cyanosis, clubbing, rash. R and LLE 2+ edema. RUE chest PICC  NEUROLOGIC:  Alert and oriented x 4.  Gait steady.  No aphasia.  No dysarthria.  Affect pleasant.      Telemetry  A sensed V paced personally reviewed  60-80s   EKG    No new tracings.    Labs   Basic Metabolic Panel: Recent Labs  Lab 01/10/18 1115 01/11/18 0201 01/12/18 0418 01/13/18 0510  NA 129* 125* 129* 123*  K 2.9* 3.5 3.8 3.5  CL 85* 82* 85* 79*  CO2 31 29 33* 33*  GLUCOSE 51* 166* 166* 162*  BUN 47* 45* 39* 36*  CREATININE 1.43* 1.41* 1.23 1.26*  CALCIUM 8.7* 8.2* 8.3* 8.1*    Liver Function Tests: Recent Labs  Lab 01/10/18 1115  AST 39  ALT 24  ALKPHOS 201*  BILITOT 1.3*  PROT 7.3  ALBUMIN 3.1*   No results for input(s): LIPASE, AMYLASE in the last 168 hours. Recent Labs  Lab 01/10/18 1115  AMMONIA 72*    CBC: Recent Labs  Lab 01/10/18 1115 01/11/18 0201 01/12/18 0418 01/13/18 0510  WBC 10.2 8.0 7.7 7.5  HGB 10.4* 9.8* 9.5* 9.4*  HCT 33.9* 30.3* 31.2* 30.5*  MCV 80.3 79.3 81.5 80.1  PLT 197 158 153 164    INR: Recent Labs  Lab 01/10/18 1115 01/11/18 0201 01/12/18 0418 01/13/18 0510  INR 2.20 2.21 2.82 3.04    Other results:    Imaging   Ir Paracentesis  Result Date: 01/11/2018 INDICATION: Heart failure with LVAD in place. Recurrent ascites. Request for therapeutic paracentesis. EXAM: ULTRASOUND GUIDED THERAPEUTIC PARACENTESIS MEDICATIONS:  10 mL of 2% lidocaine COMPLICATIONS: None immediate. PROCEDURE: Informed written consent was obtained from the patient after a discussion of the risks, benefits and alternatives to treatment. A timeout was performed prior to the initiation of the procedure. Initial ultrasound scanning demonstrates a large amount of ascites within the left lower abdominal quadrant. The left lower abdomen was prepped and draped in the usual sterile fashion. 2% lidocaine was used for local anesthesia. Following this, a 19 gauge, 7-cm, Yueh catheter was introduced. An ultrasound image was saved for documentation purposes. The paracentesis was performed. The catheter was removed and a dressing was applied. The patient tolerated the procedure well without immediate post  procedural complication. FINDINGS: A total of approximately 4.8 liters of yellow fluid was removed. IMPRESSION: Successful ultrasound-guided therapeutic paracentesis yielding 4.8 liters of peritoneal fluid. Read by: Earley Abide, PA-C Electronically Signed   By: Jacqulynn Cadet M.D.   On: 01/11/2018 11:25     Medications:     Scheduled Medications: . amiodarone  200 mg Oral Daily  . citalopram  20 mg Oral Daily  . digoxin  0.125 mg Oral Daily  . docusate sodium  200 mg Oral QHS  . gabapentin  300 mg Oral BID  . insulin aspart  5 Units Subcutaneous TID WC  . insulin glargine  10 Units Subcutaneous Q2200  . lactulose  30 g Oral BID  . magnesium oxide  400 mg Oral Daily  . metolazone  2.5 mg Oral BID  . midodrine  10 mg Oral TID WC  . multivitamin with minerals  1 tablet Oral Daily  . pantoprazole  40 mg Oral BID  . potassium chloride  40 mEq Oral BID  . spironolactone  12.5 mg Oral Daily  . sucralfate  1 g Oral TID WC & HS  . Warfarin - Pharmacist Dosing Inpatient   Does not apply q1800    Infusions: . DOBUTamine 7.5 mcg/kg/min (01/13/18 0700)  . furosemide (LASIX) infusion 20 mg/hr (01/13/18 0700)    PRN Medications: acetaminophen, cyclobenzaprine, lidocaine, ondansetron (ZOFRAN) IV, ondansetron, senna-docusate, sodium chloride flush, traZODone   Patient Profile    Ronald Miller. is a 62 y.o. male  with history of chronic systolic HF s/p Medtronic ICD 2014, CAD s/p CABG in 2000, HTN, Hx of cocaine abuse, Tobacco abuse, Depression, PAF, and COPD. HM3 placed 09/09/2016.   Admitted from clinic with encephalopathy and acute on chronic CHF. Dobutamine disconnected sometime overnight.     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 Has Medtronic ICD.  - Admitted with volume overload and acute encephalopathy.  CVP 22.  - Change lasix to gtt at 20 mg/hr. Give metolazone 2.5 mg BID. Brisk diuresis noted. Continue and add  tolvaptan.  - Continue dobutamine 7.5 mcg/kg/min. (On chronically for RV failure) - Continue mididrine 10 mg tid. -VAD parameters stable. LDH stable.  - Not a good heart transplant candidate. Has reached end stage biventricular failure. May need to address [place] once his confusion has improved.  2. CAD:  - Stable. No s/s of ischemia.    3. DMII:  - SSI.  4. PAF:  - EKG 11/13/17 with profound artifact.  - Stable. A sensed, NSR currently.  5. Anemia, iron deficiency: Had capsule endoscopy 04/2017 with severe gastritis. Continue carafate.  -Hgb 9.4  No bleeding.  6. Anxiety/Depression  - Continue celexa. Stable.   7. H/O RUE DVT:  - On coumadin. INR 3.04  Dosing per Pharm D.  8.  Chronic Anticoagulation: On coumadin, INR goal 1.8 - 2.4.  - INR 3.04 .   9. Cardiac cirrhosis with ascites:  - Had Paracentesis on 4/10 with 4.8 L out. Will continue to diurese and then repeat.  10. Confusion/Hepatic Encephalopathy - Ammonia 58 11/15/17. + Asterixis on exam.  - Ammonia 72 on admit. Ammonia is 80 today. Will increase lactulose 11. Hyponatremia  - Sodium 123. Give dose of tolvaptan.    Consult cardiac rehab.   I reviewed the LVAD parameters from today, and compared the results to the patient's prior recorded data.  No programming changes were made.  The LVAD is functioning within specified parameters.  The patient performs LVAD self-test daily.  LVAD interrogation was negative for any significant power changes, alarms or PI events/speed drops.  LVAD equipment check completed and is in good working order.  Back-up equipment present.   LVAD education done on emergency procedures and precautions and reviewed exit site care.   Length of Stay: 3  Amy Clegg, NP 01/13/2018, 7:47 AM  VAD Team --- VAD ISSUES ONLY--- Pager [phone] (7am - 7am)  Advanced Heart Failure Team  Pager [phone] (M-F; 7a - 4p)  Please contact [facility] for night-coverage after hours (4p -7a ) and weekends on  [email or web address]  Patient seen and examined with Darrick Grinder, NP. We discussed all aspects of the encounter. I agree with the assessment and plan as stated above.   Remains markedly volume overloaded. Now improving with lasix gtt and metolazone. Continue dobutamine for RV support. Still with some ascites on exam but improved may not need another paracentesis. INR 3.0. Continue coumadin. Discussed dosing with PharmD personally.Sodium falling - agree with dose of tolvaptan. Ammonia climbing - will increase lactulose from BID to TID. VAD interrogated personally. Parameters stable.  Glori Bickers, MD  11:49 AM

## 2018-01-13 NOTE — Progress Notes (Signed)
LVAD Coordinator Rounding Note:  Admitted 01/11/18 to Dr. Haroldine Laws due to acute encephalopathy and CHF with fluid overload. Home Dobutamine gtt disconnected.     HM III LVAD implanted on 09/09/16 by Dr. Cyndia Bent under Destination Therapy criteria.  Vital signs: Temp:  97.5 HR: 68 AV paced Doppler Pressure:  Not done Automatic BP: 99/83 O2 Sat: 93% on RA Wt: 220>222>213>208 lbs  LVAD interrogation reveals:  Speed: 5800 Flow: 5.5 Power:  4.5w PI:  2.2 Alarms: none Events: none Hematocrit:  36 Fixed speed:  5800 Low speed limit: 5500  Drive Line: left abdominal sorbaview dressing dry and intact; anchor intact and accurately applied. Change weekly; due Wed April 17. Placed a new anchor on drive line today.  Labs:  LDH trend: 159>136>140>141  INR trend: 2.2>2.21>2.82>3.04  Gtts: Lasix 20 mg/hr Dobutamine 7.5 mcg/kg/min  Anticoagulation Plan: -INR Goal:  1.8 - 2.3 -ASA Dose: no ASA (history of GI bleeding)  Device: -Protect; end of service 08/2016 -Therapies:  Off Last check: complete interrogation 05/26/2017. DDD (lower rate 60); BiV pacing with 96% AS-VP  Adverse Events on VAD: 11/30/16> Milrinone gtt at discharge 01/27/17>dobutamine at 5 mcg/kg/mingtt at discharge 03/08/17> RV failure 04/29/17> symptomatic anemia and melena. EGD revealed stomach ulcers. Carafate added; ASA stopped 05/31/17>Ongoing symptomatic anemia and melena; capsule study mild antral gastritis; poss tiny AVM right colon. Lower MAPS, Losartan cut back. 06/2017> admit with syncope, AMS, Dobutamine at 7.84mcg 11/09/17>Marked ascites, RV failure, and hepatic encephalopathy. Paracentesis on 2/7 and 2/12 with >11 liters fluid removed 12/27/17>Outpatient paracentesis with 2.8 liters removed 01/11/18>IP paracentesis with 4.8 liters removed   Plan/Recommendations:  1. Will need weekly dressing changes; due Wed, April 17.. Bedside RN may change dressing. 2. Please call VAD pager with any VAD equipment or drive line  issues.  Tanda Rockers RN, VAD Coordiantor 24/7 VAD pager: [phone]

## 2018-01-14 LAB — GLUCOSE, CAPILLARY
GLUCOSE-CAPILLARY: 226 mg/dL — AB (ref 65–99)
Glucose-Capillary: 168 mg/dL — ABNORMAL HIGH (ref 65–99)
Glucose-Capillary: 188 mg/dL — ABNORMAL HIGH (ref 65–99)
Glucose-Capillary: 192 mg/dL — ABNORMAL HIGH (ref 65–99)

## 2018-01-14 LAB — PROTIME-INR
INR: 2.37
Prothrombin Time: 25.7 seconds — ABNORMAL HIGH (ref 11.4–15.2)

## 2018-01-14 LAB — BASIC METABOLIC PANEL
Anion gap: 12 (ref 5–15)
BUN: 36 mg/dL — ABNORMAL HIGH (ref 6–20)
CHLORIDE: 76 mmol/L — AB (ref 101–111)
CO2: 35 mmol/L — ABNORMAL HIGH (ref 22–32)
CREATININE: 1.11 mg/dL (ref 0.61–1.24)
Calcium: 8.1 mg/dL — ABNORMAL LOW (ref 8.9–10.3)
GFR calc Af Amer: >60 mL/min (ref >60)
Glucose, Bld: 202 mg/dL — ABNORMAL HIGH (ref 65–99)
POTASSIUM: 3 mmol/L — AB (ref 3.5–5.1)
Sodium: 123 mmol/L — ABNORMAL LOW (ref 135–145)

## 2018-01-14 LAB — LACTATE DEHYDROGENASE: LDH: 132 U/L (ref 98–192)

## 2018-01-14 LAB — CBC
HCT: 30.4 % — ABNORMAL LOW (ref 39.0–52.0)
Hemoglobin: 9.4 g/dL — ABNORMAL LOW (ref 13.0–17.0)
MCH: 24.4 pg — ABNORMAL LOW (ref 26.0–34.0)
MCHC: 30.9 g/dL (ref 30.0–36.0)
MCV: 79 fL (ref 78.0–100.0)
PLATELETS: 158 10*3/uL (ref 150–400)
RBC: 3.85 MIL/uL — AB (ref 4.22–5.81)
RDW: 20 % — ABNORMAL HIGH (ref 11.5–15.5)
WBC: 7.2 10*3/uL (ref 4.0–10.5)

## 2018-01-14 LAB — SODIUM: SODIUM: 123 mmol/L — AB (ref 135–145)

## 2018-01-14 MED ORDER — SPIRONOLACTONE 25 MG PO TABS
25.0000 mg | ORAL_TABLET | Freq: Every day | ORAL | Status: DC
  Administered 2018-01-15 – 2018-01-27 (×13): 25 mg via ORAL
  Filled 2018-01-14 (×13): qty 1

## 2018-01-14 MED ORDER — WARFARIN SODIUM 5 MG PO TABS: 5.0000 mg | ORAL_TABLET | Freq: Once | ORAL | Status: DC

## 2018-01-14 MED ORDER — TOLVAPTAN 15 MG PO TABS
30.0000 mg | ORAL_TABLET | Freq: Once | ORAL | Status: AC
  Administered 2018-01-14: 30 mg via ORAL
  Filled 2018-01-14: qty 2

## 2018-01-14 MED ORDER — POTASSIUM CHLORIDE CRYS ER 20 MEQ PO TBCR
40.0000 meq | EXTENDED_RELEASE_TABLET | Freq: Once | ORAL | Status: AC
  Administered 2018-01-14: 40 meq via ORAL
  Filled 2018-01-14: qty 2

## 2018-01-14 MED ORDER — SPIRONOLACTONE 12.5 MG HALF TABLET
12.5000 mg | ORAL_TABLET | Freq: Once | ORAL | Status: AC
  Administered 2018-01-14: 12.5 mg via ORAL
  Filled 2018-01-14: qty 1

## 2018-01-14 MED ORDER — WARFARIN SODIUM 7.5 MG PO TABS
7.5000 mg | ORAL_TABLET | Freq: Once | ORAL | Status: AC
  Administered 2018-01-14: 7.5 mg via ORAL
  Filled 2018-01-14: qty 1

## 2018-01-14 NOTE — Progress Notes (Signed)
Patient ID: Ronald Kos., male   DOB: 1955/10/06, 62 y.o.   MRN: [id number]   Advanced Heart Failure VAD Team Note  PCP-Cardiologist: Glori Bickers, MD   Subjective:    Admitted with massive volume overload and disconnected dobutamine line. PICC line appeared cut.   Parecentesis 01/11/18 with 4.8 L off.   On dobutamine 7.5 mcg chronically. Diuresing with lasix drip at 20 mg per hour + metolazone twice a day. Negative 2.8 liters.  LDH stable.  CVP remains > 20.   Alert/oriented, not sleeping well.  INR 2.37.  MAP 80s.   LVAD INTERROGATION:  HeartMate II LVAD:  Flow 5.6  liters/min, speed 5800, power 4.6, PI 2.2.  1 PI event.   Objective:    Vital Signs:   Temp:  [97.7 F (36.5 C)-98.1 F (36.7 C)] 98.1 F (36.7 C) (04/13 1204) Pulse Rate:  [25-68] 62 (04/13 0745) Resp:  [14-25] 21 (04/13 1204) BP: (81-114)/(68-88) 114/88 (04/13 1204) SpO2:  [73 %-95 %] 91 % (04/13 1204) Weight:  [214 lb 11.7 oz (97.4 kg)] 214 lb 11.7 oz (97.4 kg) (04/13 0600) Last BM Date: 01/14/18 Mean arterial Pressure 80s  Intake/Output:   Intake/Output Summary (Last 24 hours) at 01/14/2018 1342 Last data filed at 01/14/2018 1238 Gross per 24 hour  Intake 1224 ml  Output 4900 ml  Net -3676 ml     Physical Exam   Physical Exam: CVP >20  GENERAL: Well appearing this am. NAD.  HEENT: Normal. NECK: Supple, JVP 16+ cm. Carotids OK.  CARDIAC:  Mechanical heart sounds with LVAD hum present.  LUNGS:  CTAB, normal effort.  ABDOMEN:  NT, moderately distended, no HSM. No bruits or masses. +BS  LVAD exit site: Well-healed and incorporated. Dressing dry and intact. No erythema or drainage. Stabilization device present and accurately applied. Driveline dressing changed daily per sterile technique. EXTREMITIES:  Warm and dry. No cyanosis, clubbing, rash. 1+ ankle edema.  NEUROLOGIC:  Alert & oriented x 3. Cranial nerves grossly intact. Moves all 4 extremities w/o difficulty. Affect pleasant      Telemetry  A sensed V paced personally reviewed 70s   EKG    No new tracings.    Labs   Basic Metabolic Panel: Recent Labs  Lab 01/10/18 1115 01/11/18 0201 01/12/18 0418 01/13/18 0510 01/13/18 1550 01/13/18 2337 01/14/18 0437  NA 129* 125* 129* 123* 122* 123* 123*  K 2.9* 3.5 3.8 3.5  --   --  3.0*  CL 85* 82* 85* 79*  --   --  76*  CO2 31 29 33* 33*  --   --  35*  GLUCOSE 51* 166* 166* 162*  --   --  202*  BUN 47* 45* 39* 36*  --   --  36*  CREATININE 1.43* 1.41* 1.23 1.26*  --   --  1.11  CALCIUM 8.7* 8.2* 8.3* 8.1*  --   --  8.1*    Liver Function Tests: Recent Labs  Lab 01/10/18 1115  AST 39  ALT 24  ALKPHOS 201*  BILITOT 1.3*  PROT 7.3  ALBUMIN 3.1*   No results for input(s): LIPASE, AMYLASE in the last 168 hours. Recent Labs  Lab 01/10/18 1115 01/13/18 0900  AMMONIA 72* 80*    CBC: Recent Labs  Lab 01/10/18 1115 01/11/18 0201 01/12/18 0418 01/13/18 0510 01/14/18 0437  WBC 10.2 8.0 7.7 7.5 7.2  HGB 10.4* 9.8* 9.5* 9.4* 9.4*  HCT 33.9* 30.3* 31.2* 30.5* 30.4*  MCV 80.3 79.3  81.5 80.1 79.0  PLT 197 158 153 164 158    INR: Recent Labs  Lab 01/10/18 1115 01/11/18 0201 01/12/18 0418 01/13/18 0510 01/14/18 0437  INR 2.20 2.21 2.82 3.04 2.37    Other results:    Imaging   No results found.   Medications:     Scheduled Medications: . amiodarone  200 mg Oral Daily  . citalopram  20 mg Oral Daily  . digoxin  0.125 mg Oral Daily  . docusate sodium  200 mg Oral QHS  . gabapentin  300 mg Oral BID  . insulin aspart  5 Units Subcutaneous TID WC  . insulin glargine  10 Units Subcutaneous Q2200  . lactulose  30 g Oral TID  . magnesium oxide  400 mg Oral Daily  . metolazone  2.5 mg Oral BID  . midodrine  10 mg Oral TID WC  . multivitamin with minerals  1 tablet Oral Daily  . pantoprazole  40 mg Oral BID  . potassium chloride  40 mEq Oral BID  . potassium chloride  40 mEq Oral Once  . spironolactone  12.5 mg Oral Once  .  [START ON 01/15/2018] spironolactone  25 mg Oral Daily  . sucralfate  1 g Oral TID WC & HS  . tolvaptan  30 mg Oral Once  . warfarin  7.5 mg Oral ONCE-1800  . Warfarin - Pharmacist Dosing Inpatient   Does not apply q1800    Infusions: . DOBUTamine 7.5 mcg/kg/min (01/14/18 0900)  . furosemide (LASIX) infusion 20 mg/hr (01/14/18 0900)    PRN Medications: acetaminophen, cyclobenzaprine, lidocaine, ondansetron (ZOFRAN) IV, ondansetron, senna-docusate, sodium chloride flush, traZODone   Patient Profile    Ronald Miller. is a 62 y.o. male  with history of chronic systolic HF s/p Medtronic ICD 2014, CAD s/p CABG in 2000, HTN, Hx of cocaine abuse, Tobacco abuse, Depression, PAF, and COPD. HM3 placed 09/09/2016.   Admitted from clinic with encephalopathy and acute on chronic CHF. Dobutamine disconnected sometime overnight.     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 Has Medtronic ICD. Admitted with volume overload and acute encephalopathy. He diuresed well yesterday but CVP still > 20.  LVAD parameters stable.  LDH stable.  - Continue Lasix gtt 20 mg/hr. Give metolazone 2.5 mg BID.  Replace K aggressively.  - Continue dobutamine 7.5 mcg/kg/min (on chronically for RV failure). - Continue mididrine 10 mg tid. - Not a good heart transplant candidate. Has reached end stage biventricular failure.  2. CAD: Stable. No chest pain.    3. DMII: SSI.  4. PAF: In NSR on amiodarone.   5. Anemia, iron deficiency: Had capsule endoscopy 04/2017 with severe gastritis. Continue carafate.  -Hgb 9.4  No bleeding.  6. Anxiety/Depression: Continue celexa. Stable.   7. H/O RUE DVT: On coumadin.   8. Chronic Anticoagulation: On coumadin, INR goal 1.8 - 2.4.  - INR 2.37 today.   9. Cardiac cirrhosis with ascites: Had Paracentesis on 4/10 with 4.8 L out. Abdomen is moderately distended, may need repeat paracentesis on Monday.  10. Confusion/Hepatic  Encephalopathy: NH3 has been high, we have increased lactulose.  - NH3 in am.  11. Hyponatremia: Sodium 123.  - Give another dose of tolvaptan 30 mg daily today.    Consult cardiac rehab.   I reviewed the LVAD parameters from today, and compared the results to the patient's prior recorded data.  No programming changes were made.  The LVAD is functioning within  specified parameters.  The patient performs LVAD self-test daily.  LVAD interrogation was negative for any significant power changes, alarms or PI events/speed drops.  LVAD equipment check completed and is in good working order.  Back-up equipment present.   LVAD education done on emergency procedures and precautions and reviewed exit site care.   Length of Stay: 4  Loralie Champagne, MD 01/14/2018, 1:42 PM  VAD Team --- VAD ISSUES ONLY--- Pager [phone] (7am - 7am)  Advanced Heart Failure Team  Pager [phone] (M-F; 7a - 4p)  Please contact [facility] for night-coverage after hours (4p -7a ) and weekends on [email or web address]

## 2018-01-14 NOTE — Progress Notes (Signed)
Attempted to ambulate patient.  Informed patient that physician would like patient to ambulate twice per day.  Patient states "he's crazier than hell".  Refused to get OOB; refused to be placed on batteries.  States "maybe tomorrow".

## 2018-01-14 NOTE — Progress Notes (Signed)
[place] for warfarin Indication: LVAD  Allergies  Allergen Reactions  . Chlorhexidine Gluconate Itching and Rash  . Codeine Nausea And Vomiting and Other (See Comments)    In high doses  . Lipitor [Atorvastatin] Nausea Only and Other (See Comments)    Nausea with high doses, tolerates 20mg  dose (08/22/16)  . Tape Other (See Comments)    Paper tape is ok    Patient Measurements: Height: 5\' 9"  (175.3 cm) Weight: 214 lb 11.7 oz (97.4 kg) IBW/kg (Calculated) : 70.7 Height: 5'9"  Vital Signs: Temp: 98.1 F (36.7 C) (04/13 0745) Temp Source: Oral (04/13 0745) BP: 93/80 (04/13 0745) Pulse Rate: 62 (04/13 0745)  Labs: Recent Labs    01/12/18 0418 01/13/18 0510 01/14/18 0437  HGB 9.5* 9.4* 9.4*  HCT 31.2* 30.5* 30.4*  PLT 153 164 158  LABPROT 29.4* 31.2* 25.7*  INR 2.82 3.04 2.37  CREATININE 1.23 1.26* 1.11    Estimated Creatinine Clearance: 80.5 mL/min (by C-G formula based on SCr of 1.11 mg/dL).   Medical History: Past Medical History:  Diagnosis Date  . AICD (automatic cardioverter/defibrillator) present   . Anxiety   . ASCVD (arteriosclerotic cardiovascular disease)   . Chronic systolic CHF (congestive heart failure) ([place])   . COPD (chronic obstructive pulmonary disease) ([place])   . Coronary artery disease   . Depression   . GI bleed   . High cholesterol   . History of blood transfusion   . History of cocaine abuse   . Hypertension   . LVAD (left ventricular assist device) present ([place])   . Presence of permanent cardiac pacemaker   . Shortness of breath dyspnea   . Suicidal ideation   . Tobacco abuse   . Type II diabetes mellitus (H[facility])     INR on admission was 2.2. INR today is down to 2.3 after holding dose last night. CBC and LDH stable. No signs/symptoms of bleeding.   Goal of Therapy:  INR 1.8-2.4 Monitor platelets by anticoagulation protocol: Yes   Plan:  Warfarin 5mg  tonight in hopes of stalling INR  drop Monitor CBC, INR  Erin Hearing PharmD., BCPS Clinical Pharmacist 01/14/2018 8:59 AM

## 2018-01-15 LAB — BASIC METABOLIC PANEL
Anion gap: 11 (ref 5–15)
BUN: 34 mg/dL — AB (ref 6–20)
CALCIUM: 8.6 mg/dL — AB (ref 8.9–10.3)
CO2: 37 mmol/L — ABNORMAL HIGH (ref 22–32)
CREATININE: 1.09 mg/dL (ref 0.61–1.24)
Chloride: 78 mmol/L — ABNORMAL LOW (ref 101–111)
GFR calc non Af Amer: >60 mL/min (ref >60)
GLUCOSE: 163 mg/dL — AB (ref 65–99)
Potassium: 3.4 mmol/L — ABNORMAL LOW (ref 3.5–5.1)
Sodium: 126 mmol/L — ABNORMAL LOW (ref 135–145)

## 2018-01-15 LAB — GLUCOSE, CAPILLARY
GLUCOSE-CAPILLARY: 245 mg/dL — AB (ref 65–99)
GLUCOSE-CAPILLARY: 243 mg/dL — AB (ref 65–99)
Glucose-Capillary: 181 mg/dL — ABNORMAL HIGH (ref 65–99)
Glucose-Capillary: 179 mg/dL — ABNORMAL HIGH (ref 65–99)

## 2018-01-15 LAB — CBC
HCT: 30.7 % — ABNORMAL LOW (ref 39.0–52.0)
Hemoglobin: 9.6 g/dL — ABNORMAL LOW (ref 13.0–17.0)
MCH: 24.9 pg — AB (ref 26.0–34.0)
MCHC: 31.3 g/dL (ref 30.0–36.0)
MCV: 79.5 fL (ref 78.0–100.0)
PLATELETS: 173 10*3/uL (ref 150–400)
RBC: 3.86 MIL/uL — AB (ref 4.22–5.81)
RDW: 20.3 % — AB (ref 11.5–15.5)
WBC: 7.7 10*3/uL (ref 4.0–10.5)

## 2018-01-15 LAB — COOXEMETRY PANEL
Carboxyhemoglobin: 1.5 % (ref 0.5–1.5)
Methemoglobin: 1.4 % (ref 0.0–1.5)
O2 Saturation: 50.2 %
TOTAL HEMOGLOBIN: 9.9 g/dL — AB (ref 12.0–16.0)

## 2018-01-15 LAB — PROTIME-INR
INR: 1.8
PROTHROMBIN TIME: 20.7 s — AB (ref 11.4–15.2)

## 2018-01-15 LAB — LACTATE DEHYDROGENASE: LDH: 138 U/L (ref 98–192)

## 2018-01-15 MED ORDER — TOLVAPTAN 15 MG PO TABS
30.0000 mg | ORAL_TABLET | Freq: Once | ORAL | Status: AC
Start: 2018-01-15 — End: 2018-01-15
  Administered 2018-01-15: 30 mg via ORAL
  Filled 2018-01-15: qty 2

## 2018-01-15 MED ORDER — WARFARIN SODIUM 7.5 MG PO TABS
7.5000 mg | ORAL_TABLET | Freq: Once | ORAL | Status: AC
  Administered 2018-01-15: 7.5 mg via ORAL
  Filled 2018-01-15: qty 1

## 2018-01-15 MED ORDER — POTASSIUM CHLORIDE CRYS ER 20 MEQ PO TBCR
40.0000 meq | EXTENDED_RELEASE_TABLET | Freq: Once | ORAL | Status: AC
Start: 2018-01-15 — End: 2018-01-15
  Administered 2018-01-15: 40 meq via ORAL
  Filled 2018-01-15: qty 2

## 2018-01-15 NOTE — Progress Notes (Signed)
Ronald Miller for warfarin Indication: LVAD  Allergies  Allergen Reactions  . Chlorhexidine Gluconate Itching and Rash  . Codeine Nausea And Vomiting and Other (See Comments)    In high doses  . Lipitor [Atorvastatin] Nausea Only and Other (See Comments)    Nausea with high doses, tolerates 20mg  dose (08/22/16)  . Tape Other (See Comments)    Paper tape is ok    Patient Measurements: Height: 5\' 9"  (175.3 cm) Weight: 214 lb 11.7 oz (97.4 kg) IBW/kg (Calculated) : 70.7 Height: 5'9"  Vital Signs: Temp: 98 F (36.7 C) (04/14 0811) Temp Source: Oral (04/14 0811) Pulse Rate: 78 (04/14 0000)  Labs: Recent Labs    01/13/18 0510 01/14/18 0437 01/15/18 0446  HGB 9.4* 9.4* 9.6*  HCT 30.5* 30.4* 30.7*  PLT 164 158 173  LABPROT 31.2* 25.7* 20.7*  INR 3.04 2.37 1.80  CREATININE 1.26* 1.11 1.09    Estimated Creatinine Clearance: 81.9 mL/min (by C-G formula based on SCr of 1.09 mg/dL).   Medical History: Past Medical History:  Diagnosis Date  . AICD (automatic cardioverter/defibrillator) present   . Anxiety   . ASCVD (arteriosclerotic cardiovascular disease)   . Chronic systolic CHF (congestive heart failure) ([place])   . COPD (chronic obstructive pulmonary disease) ([place])   . Coronary artery disease   . Depression   . GI bleed   . High cholesterol   . History of blood transfusion   . History of cocaine abuse   . Hypertension   . LVAD (left ventricular assist device) present ([place])   . Presence of permanent cardiac pacemaker   . Shortness of breath dyspnea   . Suicidal ideation   . Tobacco abuse   . Type II diabetes mellitus (HCC)     INR on admission was 2.2. INR today is down to 1.8 after holding dose 4/2. CBC and LDH stable. No signs/symptoms of bleeding.   Goal of Therapy:  INR 1.8-2.4 Monitor platelets by anticoagulation protocol: Yes   Plan:  Warfarin 7.5mg  tonight in hopes of stalling INR drop Monitor CBC, INR  Erin Hearing PharmD., BCPS Clinical Pharmacist 01/15/2018 10:30 AM

## 2018-01-15 NOTE — Progress Notes (Signed)
Patient ID: Ronald Miller., male   DOB: April 13, 1956, 62 y.o.   MRN: [id number]   Advanced Heart Failure VAD Team Note  PCP-Cardiologist: Glori Bickers, MD   Subjective:    Admitted with massive volume overload and disconnected dobutamine line. PICC line appeared cut.   Paracentesis 01/11/18 with 4.8 L off.   On dobutamine 7.5 mcg chronically. Diuresing with lasix drip at 20 mg per hour + metolazone twice a day. Negative 2.78 liters.  LDH stable.  CVP remains > 20. Got tolvaptan yesterday, Na is higher.   Alert/oriented.  MAP 80s.  LDH stable 138.    LVAD INTERROGATION:  HeartMate II LVAD:  Flow 5.6  liters/min, speed 5800, power 4.5, PI 2.3.  No PI events.   Objective:    Vital Signs:   Temp:  [98 F (36.7 C)-98.4 F (36.9 C)] 98 F (36.7 C) (04/14 0811) Pulse Rate:  [72-78] 78 (04/14 0000) Resp:  [18] 18 (04/14 0811) BP: (84)/(70) 84/70 (04/13 1625) SpO2:  [93 %] 93 % (04/14 0811) Weight:  [212 lb 12.8 oz (96.5 kg)] 212 lb 12.8 oz (96.5 kg) (04/14 0800) Last BM Date: 01/14/18 Mean arterial Pressure 80s  Intake/Output:   Intake/Output Summary (Last 24 hours) at 01/15/2018 1209 Last data filed at 01/15/2018 1200 Gross per 24 hour  Intake 1524 ml  Output 4250 ml  Net -2726 ml     Physical Exam   Physical Exam: CVP >20  GENERAL: Well appearing this am. NAD.  HEENT: Normal. NECK: Supple, JVP 16+. Carotids OK.  CARDIAC:  Mechanical heart sounds with LVAD hum present.  LUNGS:  Decreased breath sounds at bases bilaterally.   ABDOMEN:  NT, moderately distended, no HSM. No bruits or masses. +BS  LVAD exit site: Well-healed and incorporated. Dressing dry and intact. No erythema or drainage. Stabilization device present and accurately applied. Driveline dressing changed daily per sterile technique. EXTREMITIES:  Warm and dry. No cyanosis, clubbing, rash. 1+ edema to knees.   NEUROLOGIC:  Alert & oriented x 3. Cranial nerves grossly intact. Moves all 4 extremities w/o  difficulty. Affect pleasant     Telemetry  A sensed V paced 70s (personally reviewed)   EKG    No new tracings.    Labs   Basic Metabolic Panel: Recent Labs  Lab 01/11/18 0201 01/12/18 0418 01/13/18 0510 01/13/18 1550 01/13/18 2337 01/14/18 0437 01/15/18 0446  NA 125* 129* 123* 122* 123* 123* 126*  K 3.5 3.8 3.5  --   --  3.0* 3.4*  CL 82* 85* 79*  --   --  76* 78*  CO2 29 33* 33*  --   --  35* 37*  GLUCOSE 166* 166* 162*  --   --  202* 163*  BUN 45* 39* 36*  --   --  36* 34*  CREATININE 1.41* 1.23 1.26*  --   --  1.11 1.09  CALCIUM 8.2* 8.3* 8.1*  --   --  8.1* 8.6*    Liver Function Tests: Recent Labs  Lab 01/10/18 1115  AST 39  ALT 24  ALKPHOS 201*  BILITOT 1.3*  PROT 7.3  ALBUMIN 3.1*   No results for input(s): LIPASE, AMYLASE in the last 168 hours. Recent Labs  Lab 01/10/18 1115 01/13/18 0900  AMMONIA 72* 80*    CBC: Recent Labs  Lab 01/11/18 0201 01/12/18 0418 01/13/18 0510 01/14/18 0437 01/15/18 0446  WBC 8.0 7.7 7.5 7.2 7.7  HGB 9.8* 9.5* 9.4* 9.4* 9.6*  HCT  30.3* 31.2* 30.5* 30.4* 30.7*  MCV 79.3 81.5 80.1 79.0 79.5  PLT 158 153 164 158 173    INR: Recent Labs  Lab 01/11/18 0201 01/12/18 0418 01/13/18 0510 01/14/18 0437 01/15/18 0446  INR 2.21 2.82 3.04 2.37 1.80    Other results:    Imaging   No results found.   Medications:     Scheduled Medications: . amiodarone  200 mg Oral Daily  . citalopram  20 mg Oral Daily  . digoxin  0.125 mg Oral Daily  . docusate sodium  200 mg Oral QHS  . gabapentin  300 mg Oral BID  . insulin aspart  5 Units Subcutaneous TID WC  . insulin glargine  10 Units Subcutaneous Q2200  . lactulose  30 g Oral TID  . magnesium oxide  400 mg Oral Daily  . metolazone  2.5 mg Oral BID  . midodrine  10 mg Oral TID WC  . multivitamin with minerals  1 tablet Oral Daily  . pantoprazole  40 mg Oral BID  . potassium chloride  40 mEq Oral BID  . potassium chloride  40 mEq Oral Once  .  spironolactone  25 mg Oral Daily  . sucralfate  1 g Oral TID WC & HS  . tolvaptan  30 mg Oral Once  . warfarin  7.5 mg Oral ONCE-1800  . Warfarin - Pharmacist Dosing Inpatient   Does not apply q1800    Infusions: . DOBUTamine 7.5 mcg/kg/min (01/15/18 0700)  . furosemide (LASIX) infusion 20 mg/hr (01/15/18 0700)    PRN Medications: acetaminophen, cyclobenzaprine, ondansetron (ZOFRAN) IV, ondansetron, senna-docusate, sodium chloride flush, traZODone   Patient Profile    Ronald Miller. is a 62 y.o. male  with history of chronic systolic HF s/p Medtronic ICD 2014, CAD s/p CABG in 2000, HTN, Hx of cocaine abuse, Tobacco abuse, Depression, PAF, and COPD. HM3 placed 09/09/2016.   Admitted from clinic with encephalopathy and acute on chronic CHF. Dobutamine disconnected sometime overnight.     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 Has Medtronic ICD. Admitted with volume overload and acute encephalopathy. He diuresed well again yesterday but CVP still > 20.  LVAD parameters stable.  LDH stable.  - Continue Lasix gtt 20 mg/hr. Give metolazone 2.5 mg BID.  Replace K aggressively.  - Continue dobutamine 7.5 mcg/kg/min (on chronically for RV failure). - Continue midodrine 10 mg tid. - Abdomen distended, worse than yesterday.  Will arrange for IR paracentesis again tomorrow.  - Not a good heart transplant candidate. Has reached end stage biventricular failure.  2. CAD: Stable. No chest pain.    3. DMII: SSI.  4. PAF: In NSR on amiodarone.   5. Anemia, iron deficiency: Had capsule endoscopy 04/2017 with severe gastritis. Continue carafate.  -Hgb 9.6,  no bleeding.  6. Anxiety/Depression: Continue celexa. Stable.   7. H/O RUE DVT: On coumadin.   8. Chronic Anticoagulation: On coumadin, INR goal 1.8 - 2.4.  - INR 1.8 today.   9. Cardiac cirrhosis with ascites: Had Paracentesis on 4/10 with 4.8 L out. Abdomen is moderately distended, may  need repeat paracentesis on Monday.  10. Confusion/Hepatic Encephalopathy: NH3 has been high, we have increased lactulose.  - NH3 in am.  11. Hyponatremia: Sodium 126 up from 123.  - Give another dose of tolvaptan 30 mg today.   Needs to walk in halls.   I reviewed the LVAD parameters from today, and compared the results to the  patient's prior recorded data.  No programming changes were made.  The LVAD is functioning within specified parameters.  The patient performs LVAD self-test daily.  LVAD interrogation was negative for any significant power changes, alarms or PI events/speed drops.  LVAD equipment check completed and is in good working order.  Back-up equipment present.   LVAD education done on emergency procedures and precautions and reviewed exit site care.   Length of Stay: [place], MD 01/15/2018, 12:09 PM  VAD Team --- VAD ISSUES ONLY--- Pager [phone] (7am - 7am)  Advanced Heart Failure Team  Pager [phone] (M-F; 7a - 4p)  Please contact [facility] for night-coverage after hours (4p -7a ) and weekends on [email or web address]

## 2018-01-16 ENCOUNTER — Ambulatory Visit: Payer: Self-pay | Admitting: Family Medicine

## 2018-01-16 ENCOUNTER — Encounter (HOSPITAL_COMMUNITY): Payer: Self-pay | Admitting: Physician Assistant

## 2018-01-16 ENCOUNTER — Inpatient Hospital Stay (HOSPITAL_COMMUNITY): Payer: Medicare HMO

## 2018-01-16 DIAGNOSIS — Z2089 Contact with and (suspected) exposure to other communicable diseases: Secondary | ICD-10-CM

## 2018-01-16 HISTORY — PX: IR PARACENTESIS: IMG2679

## 2018-01-16 LAB — GLUCOSE, CAPILLARY
GLUCOSE-CAPILLARY: 151 mg/dL — AB (ref 65–99)
Glucose-Capillary: 268 mg/dL — ABNORMAL HIGH (ref 65–99)
Glucose-Capillary: 162 mg/dL — ABNORMAL HIGH (ref 65–99)
Glucose-Capillary: 249 mg/dL — ABNORMAL HIGH (ref 65–99)

## 2018-01-16 LAB — CBC
HEMATOCRIT: 32.1 % — AB (ref 39.0–52.0)
Hemoglobin: 9.7 g/dL — ABNORMAL LOW (ref 13.0–17.0)
MCH: 24.4 pg — AB (ref 26.0–34.0)
MCHC: 30.2 g/dL (ref 30.0–36.0)
MCV: 80.9 fL (ref 78.0–100.0)
Platelets: 173 10*3/uL (ref 150–400)
RBC: 3.97 MIL/uL — ABNORMAL LOW (ref 4.22–5.81)
RDW: 20.3 % — AB (ref 11.5–15.5)
WBC: 7.3 10*3/uL (ref 4.0–10.5)

## 2018-01-16 LAB — LACTATE DEHYDROGENASE: LDH: 132 U/L (ref 98–192)

## 2018-01-16 LAB — PROTIME-INR
INR: 1.83
Prothrombin Time: 21 seconds — ABNORMAL HIGH (ref 11.4–15.2)

## 2018-01-16 LAB — BASIC METABOLIC PANEL
Anion gap: 9 (ref 5–15)
BUN: 36 mg/dL — AB (ref 6–20)
CO2: 36 mmol/L — ABNORMAL HIGH (ref 22–32)
Calcium: 8.5 mg/dL — ABNORMAL LOW (ref 8.9–10.3)
Chloride: 82 mmol/L — ABNORMAL LOW (ref 101–111)
Creatinine, Ser: 1.18 mg/dL (ref 0.61–1.24)
GFR calc Af Amer: >60 mL/min (ref >60)
GFR calc non Af Amer: >60 mL/min (ref >60)
GLUCOSE: 191 mg/dL — AB (ref 65–99)
POTASSIUM: 4 mmol/L (ref 3.5–5.1)
Sodium: 127 mmol/L — ABNORMAL LOW (ref 135–145)

## 2018-01-16 LAB — COOXEMETRY PANEL
Carboxyhemoglobin: 1.5 % (ref 0.5–1.5)
Methemoglobin: 1.4 % (ref 0.0–1.5)
O2 Saturation: 49.7 %
Total hemoglobin: 10.3 g/dL — ABNORMAL LOW (ref 12.0–16.0)

## 2018-01-16 MED ORDER — LIDOCAINE HCL 2 % IJ SOLN
INTRAMUSCULAR | Status: AC | PRN
  Administered 2018-01-16: 10 mL

## 2018-01-16 MED ORDER — WARFARIN SODIUM 7.5 MG PO TABS
7.5000 mg | ORAL_TABLET | Freq: Once | ORAL | Status: AC
Start: 2018-01-16 — End: 2018-01-16
  Administered 2018-01-16: 7.5 mg via ORAL
  Filled 2018-01-16: qty 1

## 2018-01-16 MED ORDER — LIDOCAINE HCL (PF) 2 % IJ SOLN
INTRAMUSCULAR | Status: AC
  Filled 2018-01-16: qty 20

## 2018-01-16 MED ORDER — WARFARIN SODIUM 5 MG PO TABS: 5.0000 mg | ORAL_TABLET | Freq: Once | ORAL | Status: DC

## 2018-01-16 NOTE — Progress Notes (Signed)
LVAD Coordinator Rounding Note:  Admitted 01/11/18 to Dr. Haroldine Laws due to acute encephalopathy and CHF with fluid overload. Home Dobutamine gtt disconnected.     HM III LVAD implanted on 09/09/16 by Dr. Cyndia Bent under Destination Therapy criteria.  Vital signs: Temp:  97.5 HR: 65 AV paced Doppler Pressure:  78 Automatic BP: 97/82 (88) O2 Sat: 98% on RA Wt: 220>222>213>208>212 lbs  LVAD interrogation reveals:  Speed: 5800 Flow: 5.6 Power:  4.6w PI:  2.0 Alarms: none Events: none Hematocrit:  32 Fixed speed:  5800 Low speed limit: 5500  Drive Line: left abdominal sorbaview dressing dry and intact; anchor intact and accurately applied. Change weekly; due Wed April 17. Driveline anchored and secured.  Labs:  LDH trend: 159>136>140>141>132  INR trend: 2.2>2.21>2.82>3.04>1.83  Gtts: Lasix 20 mg/hr Dobutamine 7.5 mcg/kg/min  Anticoagulation Plan: -INR Goal:  1.8 - 2.3 -ASA Dose: no ASA (history of GI bleeding)  Device: -Protect; end of service 08/2016 -Therapies:  Off Last check: complete interrogation 05/26/2017. DDD (lower rate 60); BiV pacing with 96% AS-VP  Adverse Events on VAD: 11/30/16> Milrinone gtt at discharge 01/27/17>dobutamine at 5 mcg/kg/mingtt at discharge 03/08/17> RV failure 04/29/17> symptomatic anemia and melena. EGD revealed stomach ulcers. Carafate added; ASA stopped 05/31/17>Ongoing symptomatic anemia and melena; capsule study mild antral gastritis; poss tiny AVM right colon. Lower MAPS, Losartan cut back. 06/2017> admit with syncope, AMS, Dobutamine at 7.69mcg 11/09/17>Marked ascites, RV failure, and hepatic encephalopathy. Paracentesis on 2/7 and 2/12 with >11 liters fluid removed 12/27/17>Outpatient paracentesis with 2.8 liters removed 01/11/18>IP paracentesis with 4.8 liters removed   Plan/Recommendations:  1. Will need weekly dressing changes; due Wed, April 17.. Bedside RN may change dressing. 2. Please call VAD pager with any VAD equipment or drive  line issues.  Tanda Rockers RN, VAD Coordiantor 24/7 VAD pager: [phone]

## 2018-01-16 NOTE — Progress Notes (Signed)
CARDIAC REHAB PHASE I   PRE:  Rate/Rhythm: 71 pacing    BP: sitting 127/98 (107)     SaO2: 94-100 RA  MODE:  Ambulation: 240 ft   POST:  Rate/Rhythm: 74 pacing    BP: sitting 97/86 (91)     SaO2: 93 RA   Pt reluctant to walk but agreed. He could not coach [place] through battery placement into shoulder harness. We did batteries. Pt tried to zip bag with it still wide open. Able to stand and walk. Had to sit and rest, c/o dizziness. Stated he was alright and walked back. C/o his feet hurting. Slow pace. Could not hear doppler (would hear pulse then it would go away when started pumping up cuff) and machine BP hard to register as well. To bed, tired. We put back to power cord.  [place], ACSM 01/16/2018 2:12 PM

## 2018-01-16 NOTE — Procedures (Signed)
PROCEDURE SUMMARY:  Successful US guided paracentesis from right lateral abdomen.  Yielded 2.4 liters of clear yellow fluid.  No immediate complications.  Patient tolerated well.   Shakiah Wester S Laurynn Mccorvey PA-C 01/16/2018 10:28 AM

## 2018-01-16 NOTE — Progress Notes (Addendum)
Patient ID: Travell Desaulniers., male   DOB: 1956/07/23, 62 y.o.   MRN: [id number]   Advanced Heart Failure VAD Team Note  PCP-Cardiologist: Glori Bickers, MD   Subjective:    Admitted with massive volume overload and disconnected dobutamine line. PICC line appeared cut.   Paracentesis 01/11/18 with 4.8 L off.  Paracentesis 01/16/18 with 2.4 liters off.   Remains on dobutamine 7.5 mcg + lasix drip at 20 mg per hour. CO-OX 49%. Weight only down 10 pounds total. Not much weight off this weekend. Still up ~20 pounds from previous discharge (195 pounds). CVP 20-21  Denies SOB. Walked in the hall today.   LVAD INTERROGATION:  HeartMate II LVAD:  Flow 5.5  liters/min, speed 58000, power 5, PI 2.3    Objective:    Vital Signs:   Temp:  [97.5 F (36.4 C)-98.1 F (36.7 C)] 97.5 F (36.4 C) (04/15 1212) Resp:  [15-18] 15 (04/15 1212) BP: (92-100)/(56-82) 97/82 (04/15 1212) SpO2:  [93 %-95 %] 95 % (04/15 0749) Weight:  [212 lb 1.3 oz (96.2 kg)] 212 lb 1.3 oz (96.2 kg) (04/15 0414) Last BM Date: 01/14/18 Mean arterial Pressure 80s  Intake/Output:   Intake/Output Summary (Last 24 hours) at 01/16/2018 1308 Last data filed at 01/16/2018 1100 Gross per 24 hour  Intake 1256 ml  Output 2370 ml  Net -1114 ml     Physical Exam   Physical Exam: CVP 21. GENERAL: Sitting on the side of the bed. NAD. HEENT: normal anicterice NECK: Supple, JVP to ear .  2+ bilaterally, no bruits.  No lymphadenopathy or thyromegaly appreciated.   CARDIAC:  Mechanical heart sounds with LVAD hum present.  LUNGS:  Clear to auscultation bilaterally dull in bases,. No wheeze.  ABDOMEN:  Soft, round, nontender,  + distended positive bowel sounds x4.     LVAD exit site: well-healed and incorporated.  Dressing dry and intact.  No erythema or drainage.  Stabilization device present and accurately applied.  Driveline dressing is being changed daily per sterile technique. EXTREMITIES:  Warm and dry, no cyanosis,  clubbing, rash 1-2+ edema  NEUROLOGIC:  Alert and oriented x 4.  Gait steady.  No aphasia.  No dysarthria.  Affect pleasant.      Telemetry  A sensed V paced 70-80s personally reviewed.    EKG    No new tracings.    Labs   Basic Metabolic Panel: Recent Labs  Lab 01/12/18 0418 01/13/18 0510 01/13/18 1550 01/13/18 2337 01/14/18 0437 01/15/18 0446 01/16/18 0440  NA 129* 123* 122* 123* 123* 126* 127*  K 3.8 3.5  --   --  3.0* 3.4* 4.0  CL 85* 79*  --   --  76* 78* 82*  CO2 33* 33*  --   --  35* 37* 36*  GLUCOSE 166* 162*  --   --  202* 163* 191*  BUN 39* 36*  --   --  36* 34* 36*  CREATININE 1.23 1.26*  --   --  1.11 1.09 1.18  CALCIUM 8.3* 8.1*  --   --  8.1* 8.6* 8.5*    Liver Function Tests: Recent Labs  Lab 01/10/18 1115  AST 39  ALT 24  ALKPHOS 201*  BILITOT 1.3*  PROT 7.3  ALBUMIN 3.1*   No results for input(s): LIPASE, AMYLASE in the last 168 hours. Recent Labs  Lab 01/10/18 1115 01/13/18 0900  AMMONIA 72* 80*    CBC: Recent Labs  Lab 01/12/18 0418 01/13/18 0510 01/14/18  2992 01/15/18 0446 01/16/18 0440  WBC 7.7 7.5 7.2 7.7 7.3  HGB 9.5* 9.4* 9.4* 9.6* 9.7*  HCT 31.2* 30.5* 30.4* 30.7* 32.1*  MCV 81.5 80.1 79.0 79.5 80.9  PLT 153 164 158 173 173    INR: Recent Labs  Lab 01/12/18 0418 01/13/18 0510 01/14/18 0437 01/15/18 0446 01/16/18 0440  INR 2.82 3.04 2.37 1.80 1.83    Other results:    Imaging   Ir Paracentesis  Result Date: 01/16/2018 INDICATION: Heart failure with LVAD in place. Recurrent ascites. Request for therapeutic paracentesis. EXAM: ULTRASOUND GUIDED PARACENTESIS MEDICATIONS: 1% Lidocaine = 10 mL COMPLICATIONS: None immediate. PROCEDURE: Informed written consent was obtained from the patient after a discussion of the risks, benefits and alternatives to treatment. A timeout was performed prior to the initiation of the procedure. Initial ultrasound scanning demonstrates a moderate amount of ascites within the right  lower abdominal quadrant. The right lower abdomen was prepped and draped in the usual sterile fashion. 1% lidocaine with epinephrine was used for local anesthesia. Following this, a 19 gauge, 7-cm, Yueh catheter was introduced. An ultrasound image was saved for documentation purposes. The paracentesis was performed. The catheter was removed and a dressing was applied. The patient tolerated the procedure well without immediate post procedural complication. FINDINGS: A total of approximately 2.4 of clear yellow fluid was removed. IMPRESSION: Successful ultrasound-guided paracentesis yielding 2.4 liters of peritoneal fluid. Read by: Gareth Eagle, PA-C Electronically Signed   By: Sandi Mariscal M.D.   On: 01/16/2018 10:27     Medications:     Scheduled Medications: . amiodarone  200 mg Oral Daily  . citalopram  20 mg Oral Daily  . digoxin  0.125 mg Oral Daily  . docusate sodium  200 mg Oral QHS  . gabapentin  300 mg Oral BID  . insulin aspart  5 Units Subcutaneous TID WC  . insulin glargine  10 Units Subcutaneous Q2200  . lactulose  30 g Oral TID  . lidocaine      . magnesium oxide  400 mg Oral Daily  . metolazone  2.5 mg Oral BID  . midodrine  10 mg Oral TID WC  . multivitamin with minerals  1 tablet Oral Daily  . pantoprazole  40 mg Oral BID  . potassium chloride  40 mEq Oral BID  . spironolactone  25 mg Oral Daily  . sucralfate  1 g Oral TID WC & HS  . warfarin  7.5 mg Oral ONCE-1800  . Warfarin - Pharmacist Dosing Inpatient   Does not apply q1800    Infusions: . DOBUTamine 7.5 mcg/kg/min (01/16/18 0700)  . furosemide (LASIX) infusion 20 mg/hr (01/16/18 0700)    PRN Medications: acetaminophen, cyclobenzaprine, ondansetron (ZOFRAN) IV, ondansetron, senna-docusate, sodium chloride flush, traZODone   Patient Profile    Willie Plain. is a 62 y.o. male  with history of chronic systolic HF s/p Medtronic ICD 2014, CAD s/p CABG in 2000, HTN, Hx of cocaine abuse, Tobacco abuse,  Depression, PAF, and COPD. HM3 placed 09/09/2016.   Admitted from clinic with encephalopathy and acute on chronic CHF. Dobutamine disconnected sometime overnight.     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 Has Medtronic ICD. Admitted with volume overload and acute encephalopathy. He diuresed well again yesterday but CVP still > 20.  LVAD parameters stable.  LDH stable.  CVP 21. Continue lasix drip 20 mg per _ metolazone 2.5 mg twice a day.  K stable.  -- Continue  dobutamine 7.5 mcg/kg/min (on chronically for RV failure). CO-OX 49%.  - Continue midodrine 10 mg tid. - Not a good heart transplant candidate. Has reached end stage biventricular failure.  2. CAD: Stable. No chest pain.    3. DMII: SSI.  4. PAF: In NSR on amiodarone.   5. Anemia, iron deficiency: Had capsule endoscopy 04/2017 with severe gastritis. Continue carafate.  -Hgb 9.7   6. Anxiety/Depression: Continue celexa. Stable.   7. H/O RUE DVT: On coumadin.   8. Chronic Anticoagulation: On coumadin, INR goal 1.8 - 2.4.  - INR 1.8 Pharmacy dosing.   9. Cardiac cirrhosis with ascites: Had Paracentesis on 4/10 with 4.8 L out.  S/P Paracentesis today with 2.4 liters removed.   10. Confusion/Hepatic Encephalopathy: NH3 has been high, we have increased lactulose.  - ammonia 80 on 4/12.  - Check ammonia in a.   11. Hyponatremia: Sodium 127 today.    Hopefully home later this week. Will need Palliative Care consult soon.    I reviewed the LVAD parameters from today, and compared the results to the patient's prior recorded data.  No programming changes were made.  The LVAD is functioning within specified parameters.  The patient performs LVAD self-test daily.  LVAD interrogation was negative for any significant power changes, alarms or PI events/speed drops.  LVAD equipment check completed and is in good working order.  Back-up equipment present.   LVAD education done on emergency  procedures and precautions and reviewed exit site care.   Length of Stay: 6  Amy Clegg, NP 01/16/2018, 1:08 PM  VAD Team --- VAD ISSUES ONLY--- Pager [phone] (7am - 7am)  Advanced Heart Failure Team  Pager [phone] (M-F; 7a - 4p)  Please contact [facility] for night-coverage after hours (4p -7a ) and weekends on [email or web address]  Patient seen and examined with Darrick Grinder, NP. We discussed all aspects of the encounter. I agree with the assessment and plan as stated above.   He has end-stage RV failure and cirrhosis in the setting of VAD therapy despite dobutamine support. Weight unchanged over the weekend despite good urine output -> suspect intake is too high. Increase lasix gtt to 30/hr. Place fluid restriction. Had repeat paracentesis today with 2.4L out. Remains on TID lactulose. Confusion is improved. Recheck ammonia. INR 1.8. Discussed warfarin dosing with PharmD personally. VAD interrogated personally. Parameters stable.  Glori Bickers, MD  9:05 PM

## 2018-01-16 NOTE — Progress Notes (Addendum)
[place] for warfarin Indication: LVAD  Allergies  Allergen Reactions  . Chlorhexidine Gluconate Itching and Rash  . Codeine Nausea And Vomiting and Other (See Comments)    In high doses  . Lipitor [Atorvastatin] Nausea Only and Other (See Comments)    Nausea with high doses, tolerates 20mg  dose (08/22/16)  . Tape Other (See Comments)    Paper tape is ok   Patient Measurements: Height: 5\' 9"  (175.3 cm) Weight: 212 lb 1.3 oz (96.2 kg) IBW/kg (Calculated) : 70.7 Height: 5'9"  Vital Signs: Temp: 98 F (36.7 C) (04/15 0749) Temp Source: Oral (04/15 0749) BP: 100/63 (04/15 0749)  Labs: Recent Labs    01/14/18 0437 01/15/18 0446 01/16/18 0440  HGB 9.4* 9.6* 9.7*  HCT 30.4* 30.7* 32.1*  PLT 158 173 173  LABPROT 25.7* 20.7* 21.0*  INR 2.37 1.80 1.83  CREATININE 1.11 1.09 1.18   Estimated Creatinine Clearance: 75.2 mL/min (by C-G formula based on SCr of 1.18 mg/dL).  Medical History: Past Medical History:  Diagnosis Date  . AICD (automatic cardioverter/defibrillator) present   . Anxiety   . ASCVD (arteriosclerotic cardiovascular disease)   . Chronic systolic CHF (congestive heart failure) ([place])   . COPD (chronic obstructive pulmonary disease) ([place])   . Coronary artery disease   . Depression   . GI bleed   . High cholesterol   . History of blood transfusion   . History of cocaine abuse   . Hypertension   . LVAD (left ventricular assist device) present ([place])   . Presence of permanent cardiac pacemaker   . Shortness of breath dyspnea   . Suicidal ideation   . Tobacco abuse   . Type II diabetes mellitus (HCC)    INR on admission was 2.2. INR today is 1.83 after holding dose 4/12. CBC and LDH stable. No signs/symptoms of bleeding. This morning a paracentesis yielded 2.4 liters clear yellow fluid.  Goal of Therapy:  INR 1.8-2.4 Monitor platelets by anticoagulation protocol: Yes   Plan:  Warfarin 7.5 mg tonight Monitor CBC,  INR  Uvaldo Rising, BCPS  Clinical Pharmacist Pager [phone]  01/16/2018 12:10 PM

## 2018-01-17 ENCOUNTER — Inpatient Hospital Stay (HOSPITAL_COMMUNITY): Payer: Medicare HMO

## 2018-01-17 DIAGNOSIS — W19XXXA Unspecified fall, initial encounter: Secondary | ICD-10-CM

## 2018-01-17 LAB — CBC
HEMATOCRIT: 31.5 % — AB (ref 39.0–52.0)
HEMOGLOBIN: 9.8 g/dL — AB (ref 13.0–17.0)
MCH: 24.7 pg — ABNORMAL LOW (ref 26.0–34.0)
MCHC: 31.1 g/dL (ref 30.0–36.0)
MCV: 79.5 fL (ref 78.0–100.0)
Platelets: 164 10*3/uL (ref 150–400)
RBC: 3.96 MIL/uL — ABNORMAL LOW (ref 4.22–5.81)
RDW: 19.9 % — ABNORMAL HIGH (ref 11.5–15.5)
WBC: 7 10*3/uL (ref 4.0–10.5)

## 2018-01-17 LAB — BASIC METABOLIC PANEL
ANION GAP: 12 (ref 5–15)
BUN: 34 mg/dL — ABNORMAL HIGH (ref 6–20)
CALCIUM: 8.3 mg/dL — AB (ref 8.9–10.3)
CHLORIDE: 80 mmol/L — AB (ref 101–111)
CO2: 35 mmol/L — AB (ref 22–32)
Creatinine, Ser: 1.15 mg/dL (ref 0.61–1.24)
GFR calc non Af Amer: >60 mL/min (ref >60)
Glucose, Bld: 141 mg/dL — ABNORMAL HIGH (ref 65–99)
Potassium: 3.3 mmol/L — ABNORMAL LOW (ref 3.5–5.1)
Sodium: 127 mmol/L — ABNORMAL LOW (ref 135–145)

## 2018-01-17 LAB — COOXEMETRY PANEL
Carboxyhemoglobin: 2 % — ABNORMAL HIGH (ref 0.5–1.5)
METHEMOGLOBIN: 0.9 % (ref 0.0–1.5)
O2 Saturation: 62.8 %
Total hemoglobin: 10 g/dL — ABNORMAL LOW (ref 12.0–16.0)

## 2018-01-17 LAB — MAGNESIUM: MAGNESIUM: 2.1 mg/dL (ref 1.7–2.4)

## 2018-01-17 LAB — GLUCOSE, CAPILLARY
GLUCOSE-CAPILLARY: 124 mg/dL — AB (ref 65–99)
Glucose-Capillary: 155 mg/dL — ABNORMAL HIGH (ref 65–99)
Glucose-Capillary: 189 mg/dL — ABNORMAL HIGH (ref 65–99)

## 2018-01-17 LAB — LACTATE DEHYDROGENASE: LDH: 132 U/L (ref 98–192)

## 2018-01-17 LAB — PROTIME-INR
INR: 1.92
Prothrombin Time: 21.8 seconds — ABNORMAL HIGH (ref 11.4–15.2)

## 2018-01-17 LAB — AMMONIA: Ammonia: 50 umol/L — ABNORMAL HIGH (ref 9–35)

## 2018-01-17 MED ORDER — FUROSEMIDE 10 MG/ML IJ SOLN
30.0000 mg/h | INTRAVENOUS | Status: AC
  Administered 2018-01-17 – 2018-01-20 (×9): 30 mg/h via INTRAVENOUS
  Filled 2018-01-17 (×11): qty 25

## 2018-01-17 MED ORDER — POTASSIUM CHLORIDE 20 MEQ/15ML (10%) PO SOLN
40.0000 meq | Freq: Once | ORAL | Status: AC
  Administered 2018-01-17: 40 meq via ORAL
  Filled 2018-01-17: qty 30

## 2018-01-17 MED ORDER — WARFARIN SODIUM 5 MG PO TABS
5.0000 mg | ORAL_TABLET | Freq: Once | ORAL | Status: AC
  Administered 2018-01-17: 5 mg via ORAL
  Filled 2018-01-17: qty 1

## 2018-01-17 NOTE — Progress Notes (Signed)
Patient had unwitnessed fall this morning while using the urinal at the side of the bed. Patient adamantly requested RN to leave room, or he would not use the restroom. RN requested patient call if he needed assistance, call bell was in reach. Yellow fall socks were on.  Patient was found sitting on the floor with his back against the bed. Patient said he had felt weak and "sunk down to floor". Patient reported bumping his head on what he thinks was the IV pole. No injuries noted. Patient was assisted back to bed, vitals signs taken. BP and VAD numbers within normal limits, patient at baseline mental status, and driveline intact with no evidence of pull. RN immediately called VAD coordinator who contacted HF team. Stat CT was ordered, and diuretics stopped due to weakness.  Patient was thoroughly educated on safety measures and was placed on high sensitivity bed alarm. Floor director and Surveyor, quantity notified.

## 2018-01-17 NOTE — Progress Notes (Signed)
[place] for warfarin Indication: LVAD  Allergies  Allergen Reactions  . Chlorhexidine Gluconate Itching and Rash  . Codeine Nausea And Vomiting and Other (See Comments)    In high doses  . Lipitor [Atorvastatin] Nausea Only and Other (See Comments)    Nausea with high doses, tolerates 20mg  dose (08/22/16)  . Tape Other (See Comments)    Paper tape is ok   Patient Measurements: Height: 5\' 9"  (175.3 cm) Weight: 216 lb 7.9 oz (98.2 kg) IBW/kg (Calculated) : 70.7 Height: 5'9"  Vital Signs: Temp: 97.7 F (36.5 C) (04/16 0410) Temp Source: Oral (04/16 0410) BP: 95/76 (04/16 0837) Pulse Rate: 51 (04/16 0837)  Labs: Recent Labs    01/15/18 0446 01/16/18 0440 01/17/18 0352  HGB 9.6* 9.7* 9.8*  HCT 30.7* 32.1* 31.5*  PLT 173 173 164  LABPROT 20.7* 21.0* 21.8*  INR 1.80 1.83 1.92  CREATININE 1.09 1.18 1.15   Estimated Creatinine Clearance: 78 mL/min (by C-G formula based on SCr of 1.15 mg/dL).  Medical History: Past Medical History:  Diagnosis Date  . AICD (automatic cardioverter/defibrillator) present   . Anxiety   . ASCVD (arteriosclerotic cardiovascular disease)   . Chronic systolic CHF (congestive heart failure) ([place])   . COPD (chronic obstructive pulmonary disease) ([place])   . Coronary artery disease   . Depression   . GI bleed   . High cholesterol   . History of blood transfusion   . History of cocaine abuse   . Hypertension   . LVAD (left ventricular assist device) present ([place])   . Presence of permanent cardiac pacemaker   . Shortness of breath dyspnea   . Suicidal ideation   . Tobacco abuse   . Type II diabetes mellitus (HCC)    INR on admission was 2.2. INR today increasing from 1.83 to 1.92 (dose held 4/12). CBC and LDH stable. No signs/symptoms of bleeding. S/p second paracentesis yesterday (yielded 2.4 liters clear yellow fluid).   Patient fell and hit his head this morning; complaining of dizziness >> head CT  negative.   Goal of Therapy:  INR 1.8-2.4 Monitor platelets by anticoagulation protocol: Yes   Plan:  Warfarin 5 mg tonight Monitor CBC, INR  Doylene Canard, PharmD Clinical Pharmacist  Pager: [phone] Clinical Phone for 01/17/2018 until 3:30pm: [phone] If after 3:30pm, please call main pharmacy at [phone]  01/17/2018 8:53 AM

## 2018-01-17 NOTE — Progress Notes (Addendum)
Patient ID: Ronald Behar., male   DOB: 1956/07/12, 62 y.o.   MRN: [id number]   Advanced Heart Failure VAD Team Note  PCP-Cardiologist: Glori Bickers, MD   Subjective:    Admitted with massive volume overload and disconnected dobutamine line. PICC line appeared cut.   Paracentesis 01/11/18 with 4.8 L off.  Paracentesis 01/16/18 with 2.4 liters off.   Called by VAD coordinator for fall. Says he was dizzy and fell. Hit the back of his head.   Currently on lasix drip + metolazone + dobutamine 7.5 mcg. CVP remains markedly elevated. Confused at times but ammonia down to 50 today.   Denies SOB, orthopnea or PND. Marland Kitchen    LVAD INTERROGATION:  HeartMate II LVAD:  Flow 5.6  liters/min, speed 58000, power 4.6  PI 2.0   Objective:    Vital Signs:   Temp:  [97.5 F (36.4 C)-98.1 F (36.7 C)] 97.7 F (36.5 C) (04/16 0410) Pulse Rate:  [51-70] 51 (04/16 0837) Resp:  [12-20] 20 (04/16 0837) BP: (89-108)/(70-84) 95/76 (04/16 0837) SpO2:  [83 %-98 %] 83 % (04/16 0837) Weight:  [216 lb 7.9 oz (98.2 kg)] 216 lb 7.9 oz (98.2 kg) (04/16 0426) Last BM Date: 01/14/18 Mean arterial Pressure 70s  Intake/Output:   Intake/Output Summary (Last 24 hours) at 01/17/2018 0849 Last data filed at 01/17/2018 8416 Gross per 24 hour  Intake 2990.67 ml  Output 4550 ml  Net -1559.33 ml     Physical Exam   CVP 21  Physical Exam: GENERAL: NAD in bed. Marland KitchenNAD HEENT: normal  Anicteric. No obvious head trauma NECK: Supple, JVP to jaw.  2+ bilaterally, no bruits.  No lymphadenopathy or thyromegaly appreciated.   CARDIAC:  Mechanical heart sounds with LVAD hum present.  LUNGS:  Clear to auscultation bilaterally. Dull at bases ABDOMEN:  Obese Soft, round, nontender, positive bowel sounds x4.    + mildly distended LVAD exit site:  Dressing dry and intact.  No erythema or drainage.  Stabilization device present and accurately applied.  Driveline dressing is being changed daily per sterile technique. EXTREMITIES:   Warm and dry, no cyanosis, clubbing, rash or edema  Neuro: alert & oriented x 3, cranial nerves grossly intact. moves all 4 extremities w/o difficulty. Affect pleasant   Telemetry  A sensed V paced 70-80s personally reviewed.    EKG    No new tracings.    Labs   Basic Metabolic Panel: Recent Labs  Lab 01/13/18 0510  01/13/18 2337 01/14/18 0437 01/15/18 0446 01/16/18 0440 01/17/18 0352  NA 123*   < > 123* 123* 126* 127* 127*  K 3.5  --   --  3.0* 3.4* 4.0 3.3*  CL 79*  --   --  76* 78* 82* 80*  CO2 33*  --   --  35* 37* 36* 35*  GLUCOSE 162*  --   --  202* 163* 191* 141*  BUN 36*  --   --  36* 34* 36* 34*  CREATININE 1.26*  --   --  1.11 1.09 1.18 1.15  CALCIUM 8.1*  --   --  8.1* 8.6* 8.5* 8.3*   < > = values in this interval not displayed.    Liver Function Tests: Recent Labs  Lab 01/10/18 1115  AST 39  ALT 24  ALKPHOS 201*  BILITOT 1.3*  PROT 7.3  ALBUMIN 3.1*   No results for input(s): LIPASE, AMYLASE in the last 168 hours. Recent Labs  Lab 01/10/18 1115 01/13/18 0900  01/17/18 0353  AMMONIA 72* 80* 50*    CBC: Recent Labs  Lab 01/13/18 0510 01/14/18 0437 01/15/18 0446 01/16/18 0440 01/17/18 0352  WBC 7.5 7.2 7.7 7.3 7.0  HGB 9.4* 9.4* 9.6* 9.7* 9.8*  HCT 30.5* 30.4* 30.7* 32.1* 31.5*  MCV 80.1 79.0 79.5 80.9 79.5  PLT 164 158 173 173 164    INR: Recent Labs  Lab 01/13/18 0510 01/14/18 0437 01/15/18 0446 01/16/18 0440 01/17/18 0352  INR 3.04 2.37 1.80 1.83 1.92    Other results:    Imaging   Ir Paracentesis  Result Date: 01/16/2018 INDICATION: Heart failure with LVAD in place. Recurrent ascites. Request for therapeutic paracentesis. EXAM: ULTRASOUND GUIDED PARACENTESIS MEDICATIONS: 1% Lidocaine = 10 mL COMPLICATIONS: None immediate. PROCEDURE: Informed written consent was obtained from the patient after a discussion of the risks, benefits and alternatives to treatment. A timeout was performed prior to the initiation of the  procedure. Initial ultrasound scanning demonstrates a moderate amount of ascites within the right lower abdominal quadrant. The right lower abdomen was prepped and draped in the usual sterile fashion. 1% lidocaine with epinephrine was used for local anesthesia. Following this, a 19 gauge, 7-cm, Yueh catheter was introduced. An ultrasound image was saved for documentation purposes. The paracentesis was performed. The catheter was removed and a dressing was applied. The patient tolerated the procedure well without immediate post procedural complication. FINDINGS: A total of approximately 2.4 of clear yellow fluid was removed. IMPRESSION: Successful ultrasound-guided paracentesis yielding 2.4 liters of peritoneal fluid. Read by: Gareth Eagle, PA-C Electronically Signed   By: Sandi Mariscal M.D.   On: 01/16/2018 10:27     Medications:     Scheduled Medications: . amiodarone  200 mg Oral Daily  . citalopram  20 mg Oral Daily  . digoxin  0.125 mg Oral Daily  . docusate sodium  200 mg Oral QHS  . gabapentin  300 mg Oral BID  . insulin aspart  5 Units Subcutaneous TID WC  . insulin glargine  10 Units Subcutaneous Q2200  . lactulose  30 g Oral TID  . magnesium oxide  400 mg Oral Daily  . midodrine  10 mg Oral TID WC  . multivitamin with minerals  1 tablet Oral Daily  . pantoprazole  40 mg Oral BID  . potassium chloride  40 mEq Oral BID  . spironolactone  25 mg Oral Daily  . sucralfate  1 g Oral TID WC & HS  . Warfarin - Pharmacist Dosing Inpatient   Does not apply q1800    Infusions: . DOBUTamine 7.5 mcg/kg/min (01/17/18 0559)    PRN Medications: acetaminophen, cyclobenzaprine, ondansetron (ZOFRAN) IV, ondansetron, senna-docusate, sodium chloride flush, traZODone   Patient Profile    Ronald Fitz. is a 62 y.o. male  with history of chronic systolic HF s/p Medtronic ICD 2014, CAD s/p CABG in 2000, HTN, Hx of cocaine abuse, Tobacco abuse, Depression, PAF, and COPD. HM3 placed 09/09/2016.     Admitted from clinic with encephalopathy and acute on chronic CHF. Dobutamine disconnected sometime overnight.     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 Has Medtronic ICD. Admitted with volume overload and acute encephalopathy. He diuresed well again yesterday but CVP still > 20.  LVAD parameters stable.  LDH stable.  CVP 21. Dizzy this morning. Stop IV lasix and metolazone with fall.  -- Continue dobutamine 7.5 mcg/kg/min (on chronically for RV failure).  -CO-OX 63%.   - Continue midodrine 10  mg tid. - Not a good heart transplant candidate. Has reached end stage biventricular failure.  2. CAD: Stable. No chest pain.    3. DMII: SSI.  4. PAF: Maintaining NSR. On amiodarone.    5. Anemia, iron deficiency: Had capsule endoscopy 04/2017 with severe gastritis. Continue carafate.  -Hgb 9.8  6. Anxiety/Depression: Continue celexa. Stable.   7. H/O RUE DVT: On coumadin.   8. Chronic Anticoagulation: On coumadin, INR goal 1.8 - 2.4.  - INR 1.9 Pharmacy dosing.   9. Cardiac cirrhosis with ascites: Had Paracentesis on 4/10 with 4.8 L out.  S/P Paracentesis 4/15with 2.4 liters removed.   10. Confusion/Hepatic Encephalopathy: NH3 has been high, we have increased lactulose.  - ammonia 80 on 4/12.  - Ammonia down to 50.  11. Hyponatremia: Sodium 127. Restrict free water.    35. Fall- ? Orthostatic. Dizzy. Stop IV lasix and metolazone. Check stat CT of head now.   Not sure if home more we can diurese with dizziness. RV failure contributing. May need RHC to assess hemodynamics.    We need palliative care for goals of care.     I reviewed the LVAD parameters from today, and compared the results to the patient's prior recorded data.  No programming changes were made.  The LVAD is functioning within specified parameters.  The patient performs LVAD self-test daily.  LVAD interrogation was negative for any significant power changes, alarms or PI  events/speed drops.  LVAD equipment check completed and is in good working order.  Back-up equipment present.   LVAD education done on emergency procedures and precautions and reviewed exit site care.   Length of Stay: 7  Darrick Grinder, NP 01/17/2018, 8:49 AM  VAD Team --- VAD ISSUES ONLY--- Pager [phone] (7am - 7am)  Advanced Heart Failure Team  Pager [phone] (M-F; 7a - 4p)  Please contact [facility] for night-coverage after hours (4p -7a ) and weekends on [email or web address]   Patient seen and examined with Darrick Grinder, NP. We discussed all aspects of the encounter. I agree with the assessment and plan as stated above.   He had fall this am. No evidence of head trauma. CT head no acute injury. Discussed with RN staff and precautions put in place but unfortunately he is not very complaint with restrictions. He is making little progress despite aggressive efforts to treat his RV failure with dobutamine, IV lasix at 30 and metolazone. Fluid restriction in place. Weight recorded up 4 pounds. Confused at times but ammonia down to 50 on lactulose. May be getting close to end-stage if we can not turn him around soon. INR 1.9  Discussed dosing with PharmD personally. VAD interrogated personally. Parameters stable.  Glori Bickers, MD  10:13 PM

## 2018-01-17 NOTE — Progress Notes (Signed)
LVAD Coordinator Rounding Note:  Admitted 01/11/18 to Dr. Haroldine Laws due to acute encephalopathy and CHF with fluid overload. Home Dobutamine gtt disconnected.     HM III LVAD implanted on 09/09/16 by Dr. Cyndia Bent under Destination Therapy criteria.  Paged to the bedside this morning by the RN who states that the pt had a fall while trying to use the urinal. The pt was adamant that the nurse leave the room while he was using the urinal. Pt states that he felt weak and sunk to the floor. Pt thinks he may have hit his head. Darrick Grinder, NP notified immediately, she came directly to the bedside to assess pt and ordered a CT of his head. CT of head is negative. Pt VAD interrogation was negative for any events that may have precipitated this fall.  Ammonia level today is lower at 50.  Vital signs: Temp:  97.7 HR: 61 AV paced Doppler Pressure:  82 Automatic BP: 95/76 (84) O2 Sat: 95% on RA Wt: 220>222>213>208>212>216 lbs  LVAD interrogation reveals:  Speed: 5800 Flow: 5.4 Power:  4.5w PI:  2.4 Alarms: none Events: none Hematocrit:  32 Fixed speed:  5800 Low speed limit: 5500  Drive Line: left abdominal sorbaview dressing dry and intact; anchor intact and accurately applied. Change weekly; due Wed April 17. Driveline anchored and secured.  Labs:  LDH trend: 159>136>140>141>132>132  INR trend: 2.2>2.21>2.82>3.04>1.83>1.91  Gtts: Lasix 20 mg/hr-holding for now d/t weakness Dobutamine 7.5 mcg/kg/min  Anticoagulation Plan: -INR Goal:  1.8 - 2.3 -ASA Dose: no ASA (history of GI bleeding)  Device: -Protect; end of service 08/2016 -Therapies:  Off Last check: complete interrogation 05/26/2017. DDD (lower rate 60); BiV pacing with 96% AS-VP  Adverse Events on VAD: 11/30/16> Milrinone gtt at discharge 01/27/17>dobutamine at 5 mcg/kg/mingtt at discharge 03/08/17> RV failure 04/29/17> symptomatic anemia and melena. EGD revealed stomach ulcers. Carafate added; ASA stopped 05/31/17>Ongoing  symptomatic anemia and melena; capsule study mild antral gastritis; poss tiny AVM right colon. Lower MAPS, Losartan cut back. 06/2017> admit with syncope, AMS, Dobutamine at 7.3mcg 11/09/17>Marked ascites, RV failure, and hepatic encephalopathy. Paracentesis on 2/7 and 2/12 with >11 liters fluid removed 12/27/17>Outpatient paracentesis with 2.8 liters removed 01/11/18>IP paracentesis with 4.8 liters removed   Plan/Recommendations:  1. Will need weekly dressing changes; due Wed, April 17.. Bedside RN may change dressing. 2. Please call VAD pager with any VAD equipment or drive line issues.  Tanda Rockers RN, VAD Coordiantor 24/7 VAD pager: [phone]

## 2018-01-18 LAB — CBC
HEMATOCRIT: 32.6 % — AB (ref 39.0–52.0)
Hemoglobin: 10 g/dL — ABNORMAL LOW (ref 13.0–17.0)
MCH: 24.3 pg — ABNORMAL LOW (ref 26.0–34.0)
MCHC: 30.7 g/dL (ref 30.0–36.0)
MCV: 79.1 fL (ref 78.0–100.0)
PLATELETS: 226 10*3/uL (ref 150–400)
RBC: 4.12 MIL/uL — ABNORMAL LOW (ref 4.22–5.81)
RDW: 19.8 % — AB (ref 11.5–15.5)
WBC: 8.1 10*3/uL (ref 4.0–10.5)

## 2018-01-18 LAB — GLUCOSE, CAPILLARY
GLUCOSE-CAPILLARY: 126 mg/dL — AB (ref 65–99)
Glucose-Capillary: 168 mg/dL — ABNORMAL HIGH (ref 65–99)
Glucose-Capillary: 159 mg/dL — ABNORMAL HIGH (ref 65–99)

## 2018-01-18 LAB — BASIC METABOLIC PANEL
ANION GAP: 9 (ref 5–15)
BUN: 32 mg/dL — AB (ref 6–20)
CALCIUM: 8.5 mg/dL — AB (ref 8.9–10.3)
CO2: 35 mmol/L — AB (ref 22–32)
Chloride: 85 mmol/L — ABNORMAL LOW (ref 101–111)
Creatinine, Ser: 1.27 mg/dL — ABNORMAL HIGH (ref 0.61–1.24)
GFR calc Af Amer: >60 mL/min (ref >60)
GFR calc non Af Amer: 59 mL/min — ABNORMAL LOW (ref >60)
GLUCOSE: 137 mg/dL — AB (ref 65–99)
Potassium: 3.3 mmol/L — ABNORMAL LOW (ref 3.5–5.1)
Sodium: 129 mmol/L — ABNORMAL LOW (ref 135–145)

## 2018-01-18 LAB — SODIUM: Sodium: 130 mmol/L — ABNORMAL LOW (ref 135–145)

## 2018-01-18 LAB — COOXEMETRY PANEL
CARBOXYHEMOGLOBIN: 1.8 % — AB (ref 0.5–1.5)
METHEMOGLOBIN: 1.6 % — AB (ref 0.0–1.5)
O2 Saturation: 69.3 %
Total hemoglobin: 10 g/dL — ABNORMAL LOW (ref 12.0–16.0)

## 2018-01-18 LAB — LACTATE DEHYDROGENASE: LDH: 127 U/L (ref 98–192)

## 2018-01-18 LAB — PROTIME-INR
INR: 1.97
Prothrombin Time: 22.2 seconds — ABNORMAL HIGH (ref 11.4–15.2)

## 2018-01-18 MED ORDER — ACETAZOLAMIDE 250 MG PO TABS
250.0000 mg | ORAL_TABLET | Freq: Two times a day (BID) | ORAL | Status: AC
  Administered 2018-01-18 – 2018-01-20 (×6): 250 mg via ORAL
  Filled 2018-01-18 (×6): qty 1

## 2018-01-18 MED ORDER — METOLAZONE 2.5 MG PO TABS: 2.5000 mg | ORAL_TABLET | Freq: Two times a day (BID) | ORAL | Status: DC

## 2018-01-18 MED ORDER — METOLAZONE 5 MG PO TABS
5.0000 mg | ORAL_TABLET | Freq: Two times a day (BID) | ORAL | Status: DC
  Administered 2018-01-18 – 2018-01-21 (×7): 5 mg via ORAL
  Filled 2018-01-18 (×7): qty 1

## 2018-01-18 MED ORDER — POTASSIUM CHLORIDE CRYS ER 20 MEQ PO TBCR
40.0000 meq | EXTENDED_RELEASE_TABLET | Freq: Once | ORAL | Status: AC
  Administered 2018-01-18: 40 meq via ORAL

## 2018-01-18 MED ORDER — WARFARIN SODIUM 7.5 MG PO TABS
7.5000 mg | ORAL_TABLET | Freq: Once | ORAL | Status: AC
  Administered 2018-01-18: 7.5 mg via ORAL
  Filled 2018-01-18: qty 1

## 2018-01-18 NOTE — Progress Notes (Signed)
LVAD Coordinator Rounding Note:  Admitted 01/11/18 to Dr. Haroldine Laws due to acute encephalopathy and CHF with fluid overload. Home Dobutamine gtt disconnected.     HM III LVAD implanted on 09/09/16 by Dr. Cyndia Bent under Destination Therapy criteria.   Vital signs: Temp:  98.8 HR: 66 AV paced Doppler Pressure:  Not done Automatic BP: 84/74 O2 Sat: 93% on RA Wt: 220>222>213>208>212>216>198 lbs  LVAD interrogation reveals:  Speed: 5800 Flow: 5.6 Power:  4.6w PI:  2.2 Alarms: none Events: none Hematocrit:  32 Fixed speed:  5800 Low speed limit: 5500  Drive Line: left abdominal sorbaview dressing dry and intact; anchor intact and accurately applied. Change weekly; due Wed April 17. Driveline anchored and secured.  Labs:  LDH trend: 159>136>140>141>132>132  INR trend: 2.2>2.21>2.82>3.04>1.83>1.91>1.97  Gtts: Lasix 30 mg/hr Dobutamine 7.5 mcg/kg/min  Anticoagulation Plan: -INR Goal:  1.8 - 2.3 -ASA Dose: no ASA (history of GI bleeding)  Device: -Protect; end of service 08/2016 -Therapies:  Off Last check: complete interrogation 05/26/2017. DDD (lower rate 60); BiV pacing with 96% AS-VP  Adverse Events on VAD: 11/30/16> Milrinone gtt at discharge 01/27/17>dobutamine at 5 mcg/kg/mingtt at discharge 03/08/17> RV failure 04/29/17> symptomatic anemia and melena. EGD revealed stomach ulcers. Carafate added; ASA stopped 05/31/17>Ongoing symptomatic anemia and melena; capsule study mild antral gastritis; poss tiny AVM right colon. Lower MAPS, Losartan cut back. 06/2017> admit with syncope, AMS, Dobutamine at 7.24mcg 11/09/17>Marked ascites, RV failure, and hepatic encephalopathy. Paracentesis on 2/7 and 2/12 with >11 liters fluid removed 12/27/17>Outpatient paracentesis with 2.8 liters removed 01/11/18>IP paracentesis with 4.8 liters removed   Plan/Recommendations:  1. Will need weekly dressing changes; due Wed, April 17.. Bedside RN please change dressing today. 2. Please call VAD pager  with any VAD equipment or drive line issues.  Tanda Rockers RN, VAD Coordiantor 24/7 VAD pager: [phone]

## 2018-01-18 NOTE — Progress Notes (Signed)
Ronald Miller for warfarin Indication: LVAD  Allergies  Allergen Reactions  . Chlorhexidine Gluconate Itching and Rash  . Codeine Nausea And Vomiting and Other (See Comments)    In high doses  . Lipitor [Atorvastatin] Nausea Only and Other (See Comments)    Nausea with high doses, tolerates 20mg  dose (08/22/16)  . Tape Other (See Comments)    Paper tape is ok   Patient Measurements: Height: 5\' 9"  (175.3 cm) Weight: 198 lb (89.8 kg) IBW/kg (Calculated) : 70.7 Height: 5'9"  Vital Signs: Temp: 98 F (36.7 C) (04/16 2358) Temp Source: Oral (04/16 2358) BP: 84/74 (04/17 0812) Pulse Rate: 111 (04/17 0812)  Labs: Recent Labs    01/16/18 0440 01/17/18 0352 01/18/18 0400  HGB 9.7* 9.8*  --   HCT 32.1* 31.5*  --   PLT 173 164  --   LABPROT 21.0* 21.8* 22.2*  INR 1.83 1.92 1.97  CREATININE 1.18 1.15  --    Estimated Creatinine Clearance: 74.7 mL/min (by C-G formula based on SCr of 1.15 mg/dL).  Medical History: Past Medical History:  Diagnosis Date  . AICD (automatic cardioverter/defibrillator) present   . Anxiety   . ASCVD (arteriosclerotic cardiovascular disease)   . Chronic systolic CHF (congestive heart failure) ([place])   . COPD (chronic obstructive pulmonary disease) ([place])   . Coronary artery disease   . Depression   . GI bleed   . High cholesterol   . History of blood transfusion   . History of cocaine abuse   . Hypertension   . LVAD (left ventricular assist device) present ([place])   . Presence of permanent cardiac pacemaker   . Shortness of breath dyspnea   . Suicidal ideation   . Tobacco abuse   . Type II diabetes mellitus ([facility])    INR on admission was 2.2. INR today increasing from 1.92 to 1.97 (dose held 4/12). CBC and LDH stable on last check. No signs/symptoms of bleeding. S/p second paracentesis 4/15 (yielded 2.4 liters clear yellow fluid).   Patient fell and hit his head on 4/16; complaining of dizziness >> head CT  negative.   Goal of Therapy:  INR 1.8-2.4 Monitor platelets by anticoagulation protocol: Yes   Plan:  Warfarin 7.5 mg tonight Monitor CBC, INR  Doylene Canard, PharmD Clinical Pharmacist  Pager: [phone] Clinical Phone for 01/18/2018 until 3:30pm: [phone] If after 3:30pm, please call main pharmacy at [phone]  01/18/2018 9:50 AM

## 2018-01-18 NOTE — Progress Notes (Addendum)
Patient ID: Ronald Disney., male   DOB: Jun 03, 1956, 62 y.o.   MRN: [id number]   Advanced Heart Failure VAD Team Note  PCP-Cardiologist: Glori Bickers, MD   Subjective:    Admitted with massive volume overload and disconnected dobutamine line. PICC line appeared cut.   Paracentesis 01/11/18 with 4.8 L off.  Paracentesis 01/16/18 with 2.4 liters off.   Unwitnessed fall on 4/16. CT of head negative.   Yesterday diuretics temporarily stopped after fall. Diuresis picked up with lasix drip at 30 mg per hour but CVP still 20 .  Drinking a lot of fluid   Feeling better. More clear. Denies SOB    LVAD INTERROGATION:  HeartMate II LVAD:  Flow 5.5  liters/min, speed 5800, power 5  PI 2.2   Objective:    Vital Signs:   Temp:  [97.4 F (36.3 C)-98.4 F (36.9 C)] 98 F (36.7 C) (04/16 2358) Pulse Rate:  [46-111] 111 (04/17 0812) Resp:  [16-18] 16 (04/16 2358) BP: (80-90)/(62-74) 84/74 (04/17 0812) SpO2:  [89 %-96 %] 96 % (04/16 2358) Weight:  [198 lb (89.8 kg)] 198 lb (89.8 kg) (04/17 0523) Last BM Date: 01/16/18 Mean arterial Pressure 70s  Intake/Output:   Intake/Output Summary (Last 24 hours) at 01/18/2018 0940 Last data filed at 01/18/2018 0524 Gross per 24 hour  Intake 2416.5 ml  Output 3225 ml  Net -808.5 ml     Physical Exam   CVP 20  Physical Exam: General:   No resp difficulty. NAD  HEENT: normal anicteric Neck: supple. JVP to jaw . Carotids 2+ bilat; no bruits. No lymphadenopathy or thryomegaly appreciated. Cor: PMI nondisplaced. Regular rate & rhythm. No rubs, gallops or murmurs. R upper chest tunneled catheter.  Lungs: clear Abdomen: obese, soft, nontender, + distended. No hepatosplenomegaly. No bruits or masses. Good bowel sounds. Extremities: no cyanosis, clubbing, rash, 1-2+ edema Neuro: alert & orientedx3, cranial nerves grossly intact. moves all 4 extremities w/o difficulty. Affect pleasant  Telemetry  A sensed V paced 70-80s personally reviewed.   EKG     No new tracings.    Labs   Basic Metabolic Panel: Recent Labs  Lab 01/13/18 0510  01/14/18 0437 01/15/18 0446 01/16/18 0440 01/17/18 0352 01/17/18 0500 01/18/18 0400  NA 123*   < > 123* 126* 127* 127*  --  130*  K 3.5  --  3.0* 3.4* 4.0 3.3*  --   --   CL 79*  --  76* 78* 82* 80*  --   --   CO2 33*  --  35* 37* 36* 35*  --   --   GLUCOSE 162*  --  202* 163* 191* 141*  --   --   BUN 36*  --  36* 34* 36* 34*  --   --   CREATININE 1.26*  --  1.11 1.09 1.18 1.15  --   --   CALCIUM 8.1*  --  8.1* 8.6* 8.5* 8.3*  --   --   MG  --   --   --   --   --   --  2.1  --    < > = values in this interval not displayed.    Liver Function Tests: No results for input(s): AST, ALT, ALKPHOS, BILITOT, PROT, ALBUMIN in the last 168 hours. No results for input(s): LIPASE, AMYLASE in the last 168 hours. Recent Labs  Lab 01/13/18 0900 01/17/18 0353  AMMONIA 80* 50*    CBC: Recent Labs  Lab 01/13/18  0510 01/14/18 0437 01/15/18 0446 01/16/18 0440 01/17/18 0352  WBC 7.5 7.2 7.7 7.3 7.0  HGB 9.4* 9.4* 9.6* 9.7* 9.8*  HCT 30.5* 30.4* 30.7* 32.1* 31.5*  MCV 80.1 79.0 79.5 80.9 79.5  PLT 164 158 173 173 164    INR: Recent Labs  Lab 01/14/18 0437 01/15/18 0446 01/16/18 0440 01/17/18 0352 01/18/18 0400  INR 2.37 1.80 1.83 1.92 1.97    Other results:    Imaging   Ct Head Wo Contrast  Result Date: 01/17/2018 CLINICAL DATA:  LVAD pt Pt fell and hit head this am C/o generalized head pain ataxia EXAM: CT HEAD WITHOUT CONTRAST TECHNIQUE: Contiguous axial images were obtained from the base of the skull through the vertex without intravenous contrast. COMPARISON:  04/10/2017 FINDINGS: Brain: Mild atrophy. No evidence of acute infarction, hemorrhage, hydrocephalus, extra-axial collection or mass lesion/mass effect. Vascular: Atherosclerotic and physiologic intracranial calcifications. Skull: Normal. Negative for fracture or focal lesion. Sinuses/Orbits: Some motion degradation.  No  acute findings evident. Other: None. IMPRESSION: Negative Electronically Signed   By: Lucrezia Europe M.D.   On: 01/17/2018 10:54     Medications:     Scheduled Medications: . amiodarone  200 mg Oral Daily  . citalopram  20 mg Oral Daily  . digoxin  0.125 mg Oral Daily  . docusate sodium  200 mg Oral QHS  . gabapentin  300 mg Oral BID  . insulin aspart  5 Units Subcutaneous TID WC  . insulin glargine  10 Units Subcutaneous Q2200  . lactulose  30 g Oral TID  . magnesium oxide  400 mg Oral Daily  . midodrine  10 mg Oral TID WC  . multivitamin with minerals  1 tablet Oral Daily  . pantoprazole  40 mg Oral BID  . potassium chloride  40 mEq Oral BID  . spironolactone  25 mg Oral Daily  . sucralfate  1 g Oral TID WC & HS  . Warfarin - Pharmacist Dosing Inpatient   Does not apply q1800    Infusions: . DOBUTamine 7.5 mcg/kg/min (01/18/18 0505)  . furosemide (LASIX) infusion 30 mg/hr (01/18/18 0557)    PRN Medications: acetaminophen, cyclobenzaprine, ondansetron (ZOFRAN) IV, ondansetron, senna-docusate, sodium chloride flush, traZODone   Patient Profile    Ronald Miller. is a 62 y.o. male  with history of chronic systolic HF s/p Medtronic ICD 2014, CAD s/p CABG in 2000, HTN, Hx of cocaine abuse, Tobacco abuse, Depression, PAF, and COPD. HM3 placed 09/09/2016.   Admitted from clinic with encephalopathy and acute on chronic CHF. Dobutamine disconnected sometime overnight.     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 Has Medtronic ICD. Admitted with volume overload and acute encephalopathy. He diuresed well again yesterday but CVP still > 20.  LVAD parameters stable.  LDH stable.  CVP 20. Continue lasix drip 30 mg per hour. Restart metolazone 5 mg twice a day and add diamox 250 mg twice a day for 3 days.  -BMET pending.  -- Continue dobutamine 7.5 mcg/kg/min (on chronically for RV failure).  -CO-OX 63%.   - Continue midodrine 10 mg  tid. - Not a good heart transplant candidate. Has reached end stage biventricular failure.  2. CAD: Stable. No chest pain.    3. DMII: SSI.  4. PAF: Maintaining NSR. On amiodarone.    5. Anemia, iron deficiency: Had capsule endoscopy 04/2017 with severe gastritis. Continue carafate.  -CBC pending.  6. Anxiety/Depression: Continue celexa. Stable.   7. H/O  RUE DVT: On coumadin.   8. Chronic Anticoagulation: On coumadin, INR goal 1.8 - 2.4.  - INR 1.9. Discussed with pharmacy.    9. Cardiac cirrhosis with ascites: Had Paracentesis on 4/10 with 4.8 L out.  S/P Paracentesis 4/15with 2.4 liters removed.   10. Confusion/Hepatic Encephalopathy: NH3 has been high, we have increased lactulose.  - ammonia 80 on 4/12.  - Ammonia down to 50 on 4/16 .  11. Hyponatremia: BMET 94. Fall- unwitnessed 4/16. CT of head negative.   Continue diurese. OOB walking today.  I reviewed the LVAD parameters from today, and compared the results to the patient's prior recorded data.  No programming changes were made.  The LVAD is functioning within specified parameters.  The patient performs LVAD self-test daily.  LVAD interrogation was negative for any significant power changes, alarms or PI events/speed drops.  LVAD equipment check completed and is in good working order.  Back-up equipment present.   LVAD education done on emergency procedures and precautions and reviewed exit site care.   Length of Stay: [place], NP 01/18/2018, 9:40 AM  VAD Team --- VAD ISSUES ONLY--- Pager [phone] (7am - 7am)  Advanced Heart Failure Team  Pager [phone] (M-F; 7a - 4p)  Please contact [facility] for night-coverage after hours (4p -7a ) and weekends on [email or web address]  Patient seen and examined with Darrick Grinder, NP. We discussed all aspects of the encounter. I agree with the assessment and plan as stated above.   Volume status finally starting to improve on high-dose lasix and dobutamine. Weight recorded as down 18 pounds  today (?). CVP still 20. Agree with adding diamox. Fluid restriction discussed with RN staff and they are doing a good job with it. Mental status more clear. Ammonia down. INR 1.9. Discussed dosing with PharmD personally. VAD interrogated personally. Parameters stable. I discussed fall precautions with him.  Glori Bickers, MD  9:53 PM

## 2018-01-19 LAB — CBC
HCT: 33.9 % — ABNORMAL LOW (ref 39.0–52.0)
Hemoglobin: 10.3 g/dL — ABNORMAL LOW (ref 13.0–17.0)
MCH: 24.1 pg — ABNORMAL LOW (ref 26.0–34.0)
MCHC: 30.4 g/dL (ref 30.0–36.0)
MCV: 79.4 fL (ref 78.0–100.0)
PLATELETS: 201 10*3/uL (ref 150–400)
RBC: 4.27 MIL/uL (ref 4.22–5.81)
RDW: 19.7 % — AB (ref 11.5–15.5)
WBC: 6.9 10*3/uL (ref 4.0–10.5)

## 2018-01-19 LAB — BASIC METABOLIC PANEL
ANION GAP: 11 (ref 5–15)
BUN: 35 mg/dL — ABNORMAL HIGH (ref 6–20)
CALCIUM: 8.5 mg/dL — AB (ref 8.9–10.3)
CO2: 33 mmol/L — ABNORMAL HIGH (ref 22–32)
Chloride: 84 mmol/L — ABNORMAL LOW (ref 101–111)
Creatinine, Ser: 1.29 mg/dL — ABNORMAL HIGH (ref 0.61–1.24)
GFR calc Af Amer: >60 mL/min (ref >60)
GFR, EST NON AFRICAN AMERICAN: 58 mL/min — AB (ref >60)
GLUCOSE: 148 mg/dL — AB (ref 65–99)
Potassium: 3.6 mmol/L (ref 3.5–5.1)
Sodium: 128 mmol/L — ABNORMAL LOW (ref 135–145)

## 2018-01-19 LAB — GLUCOSE, CAPILLARY
GLUCOSE-CAPILLARY: 121 mg/dL — AB (ref 65–99)
GLUCOSE-CAPILLARY: 147 mg/dL — AB (ref 65–99)
Glucose-Capillary: 197 mg/dL — ABNORMAL HIGH (ref 65–99)
Glucose-Capillary: 160 mg/dL — ABNORMAL HIGH (ref 65–99)

## 2018-01-19 LAB — COOXEMETRY PANEL
Carboxyhemoglobin: 2.1 % — ABNORMAL HIGH (ref 0.5–1.5)
METHEMOGLOBIN: 1.1 % (ref 0.0–1.5)
O2 SAT: 60.6 %
TOTAL HEMOGLOBIN: 10.8 g/dL — AB (ref 12.0–16.0)

## 2018-01-19 LAB — PROTIME-INR
INR: 1.82
Prothrombin Time: 20.9 seconds — ABNORMAL HIGH (ref 11.4–15.2)

## 2018-01-19 LAB — LACTATE DEHYDROGENASE: LDH: 137 U/L (ref 98–192)

## 2018-01-19 MED ORDER — WARFARIN SODIUM 7.5 MG PO TABS
7.5000 mg | ORAL_TABLET | Freq: Once | ORAL | Status: AC
  Administered 2018-01-19: 7.5 mg via ORAL
  Filled 2018-01-19: qty 1

## 2018-01-19 NOTE — Progress Notes (Signed)
[place] for Warfarin Indication: LVAD  Allergies  Allergen Reactions  . Chlorhexidine Gluconate Itching and Rash  . Codeine Nausea And Vomiting and Other (See Comments)    In high doses  . Lipitor [Atorvastatin] Nausea Only and Other (See Comments)    Nausea with high doses, tolerates 20mg  dose (08/22/16)  . Tape Other (See Comments)    Paper tape is ok   Patient Measurements: Height: 5\' 9"  (175.3 cm) Weight: 198 lb (89.8 kg) IBW/kg (Calculated) : 70.7 Height: 5'9"  Vital Signs: Temp: 98.1 F (36.7 C) (04/18 0749) Temp Source: Oral (04/18 0749) BP: 81/71 (04/18 0836) Pulse Rate: 76 (04/18 0749)  Labs: Recent Labs    01/17/18 0352 01/18/18 0400 01/18/18 0942 01/18/18 1559 01/19/18 0400  HGB 9.8*  --  10.0*  --  10.3*  HCT 31.5*  --  32.6*  --  33.9*  PLT 164  --  226  --  201  LABPROT 21.8* 22.2*  --   --  20.9*  INR 1.92 1.97  --   --  1.82  CREATININE 1.15  --   --  1.27* 1.29*   Estimated Creatinine Clearance: 66.6 mL/min (A) (by C-G formula based on SCr of 1.29 mg/dL (H)).  Assessment: 21 yoM on warfarin PTA for LVAD. INR remains just within goal today at 1.82. Hgb 10.3, pltc WNL - stable. No bleeding noted. No signs/symptoms of bleeding.   S/p second paracentesis 4/15 (yielded 2.4 liters clear yellow fluid). Patient fell and hit his head on 4/16 - head CT negative.   PTA regimen: 7.5mg  on all days except 5mg  on Mondays and Fridays (admit INR 2.2)  Goal of Therapy:  INR 1.8-2.4 Monitor platelets by anticoagulation protocol: Yes   Plan:  Warfarin 7.5 mg PO x1 tonight Daily INR and CBC Monitor for s/sx of bleeding  Erin N. Gerarda Fraction, PharmD PGY1 Pharmacy Resident Pager: [phone] 01/19/18   10:43 AM

## 2018-01-19 NOTE — Progress Notes (Signed)
LVAD Coordinator Rounding Note:  Admitted 01/11/18 to Dr. Haroldine Laws due to acute encephalopathy and CHF with fluid overload. Home Dobutamine gtt disconnected.     HM III LVAD implanted on 09/09/16 by Dr. Cyndia Bent under Destination Therapy criteria.   Vital signs: Temp:  98.1 HR: 76 AV paced Doppler Pressure:  72 Automatic BP: 81/71 )77) O2 Sat: 93% on RA Wt: 220>222>213>208>212>216>198 lbs  LVAD interrogation reveals:  Speed: 5800 Flow: 5.7 Power:  4.6w PI:  1.8 Alarms: none Events: none Hematocrit:  34 Fixed speed:  5800 Low speed limit: 5500  Drive Line: left abdominal sorbaview dressing dry and intact; anchor intact and accurately applied. Change weekly; due [facility] April 25. Driveline anchored and secured.  Labs:  LDH trend: 159>136>140>141>132>132>137  INR trend: 2.2>2.21>2.82>3.04>1.83>1.91>1.97>1.82  Gtts: Lasix 30 mg/hr Dobutamine 7.5 mcg/kg/min  Anticoagulation Plan: -INR Goal:  1.8 - 2.3 -ASA Dose: no ASA (history of GI bleeding)  Device: -Protect; end of service 08/2016 -Therapies:  Off Last check: complete interrogation 05/26/2017. DDD (lower rate 60); BiV pacing with 96% AS-VP  Adverse Events on VAD: 11/30/16> Milrinone gtt at discharge 01/27/17>dobutamine at 5 mcg/kg/mingtt at discharge 03/08/17> RV failure 04/29/17> symptomatic anemia and melena. EGD revealed stomach ulcers. Carafate added; ASA stopped 05/31/17>Ongoing symptomatic anemia and melena; capsule study mild antral gastritis; poss tiny AVM right colon. Lower MAPS, Losartan cut back. 06/2017> admit with syncope, AMS, Dobutamine at 7.62mcg 11/09/17>Marked ascites, RV failure, and hepatic encephalopathy. Paracentesis on 2/7 and 2/12 with >11 liters fluid removed 12/27/17>Outpatient paracentesis with 2.8 liters removed 01/11/18>IP paracentesis with 4.8 liters removed   Plan/Recommendations:  1. Will need weekly dressing changes; due [facility] April 25. Bedside RN please change dressing today. 2. Pt may not be  able to discharge home alone. Need to discuss goals of care and discharge planning. 3. Please call VAD pager with any VAD equipment or drive line issues.  Tanda Rockers RN, VAD Coordiantor 24/7 VAD pager: [phone]

## 2018-01-19 NOTE — Care Management Note (Signed)
Case Management Note  Patient Details  Name: Detroit Frieden. MRN: [id number] Date of Birth: 1955-12-04  Subjective/Objective:     Pt admitted with fluid overload - pt is a home dobutamine pt and drip was disconnected                Action/Plan:  PTA independent from home active with [facility] for [facility] and dobutamine infusion - [facility] will resume services at discharge   Expected Discharge Date:                  Expected Discharge Plan:  [place]  In-House Referral:     Discharge planning Services  CM Consult  Post Acute Care Choice:    Choice offered to:  Patient  DME Arranged:    DME Agency:  [place]:  RN [facility] Agency:     Status of Service:     If discussed at [employer], dates discussed:    Additional Comments: 01/19/2018 Pt remains on IV dobutamine and IV lasix.  Per bedside nurse pt is really deconditioned today - CM ordered PT eval.   Maryclare Labrador, RN 01/19/2018, 2:02 PM

## 2018-01-19 NOTE — Progress Notes (Addendum)
Patient ID: Murl Golladay., male   DOB: [birth year], 62 y.o.   MRN: [id number]   Advanced Heart Failure VAD Team Note  PCP-Cardiologist: Glori Bickers, MD   Subjective:    Admitted with massive volume overload and disconnected dobutamine line. PICC line appeared cut.   Paracentesis 01/11/18 with 4.8 L off.  Paracentesis 01/16/18 with 2.4 liters off.   Unwitnessed fall on 4/16. CT of head negative.   Yesterday diamox added and continued to diurese with IV lasix + metolazone. Weight down another 7 pounds. Overall weight down 23 pounds.    Denies SOB. Mental status cleared. Remains on dobutamine. Attempted to check CVP personally but CVP line clotted.   LVAD INTERROGATION:  HeartMate II LVAD:  Flow 5.4  liters/min, speed 5800, power 4  PI 1.7   Objective:    Vital Signs:   Temp:  [98 F (36.7 C)-98.1 F (36.7 C)] 98.1 F (36.7 C) (04/18 0749) Pulse Rate:  [59-76] 76 (04/18 0749) Resp:  [16-18] 16 (04/18 0749) BP: (60-104)/(36-71) 81/71 (04/18 0836) SpO2:  [91 %-95 %] 91 % (04/18 0749) Weight:  [192 lb 10.9 oz (87.4 kg)] 192 lb 10.9 oz (87.4 kg) (04/18 1032) Last BM Date: 01/18/18 Mean arterial Pressure 70-80s  Intake/Output:   Intake/Output Summary (Last 24 hours) at 01/19/2018 1103 Last data filed at 01/19/2018 0839 Gross per 24 hour  Intake 1553.93 ml  Output 5650 ml  Net -4096.07 ml     Physical Exam   Physical Exam: General:  Sitting on side of bed No resp difficulty HEENT: normal anicteric Neck: supple. JVP to jaw. Carotids 2+ bilat; no bruits. No lymphadenopathy or thryomegaly appreciated. Cor: LVAD hum R upper chest tunneled catheter.  Lungs: clear decreased at basese Abdomen: soft, nontender, +distended. No hepatosplenomegaly. No bruits or masses. Good bowel sounds. Extremities: no cyanosis, clubbing, rash, R and LLE 2+ edema Neuro: alert & oriented x 3, cranial nerves grossly intact. moves all 4 extremities w/o difficulty. Affect pleasant  Telemetry  A  sensed V paced 70-80s personally reviewed.   EKG    No new tracings.    Labs   Basic Metabolic Panel: Recent Labs  Lab 01/15/18 0446 01/16/18 0440 01/17/18 0352 01/17/18 0500 01/18/18 0400 01/18/18 1559 01/19/18 0400  NA 126* 127* 127*  --  130* 129* 128*  K 3.4* 4.0 3.3*  --   --  3.3* 3.6  CL 78* 82* 80*  --   --  85* 84*  CO2 37* 36* 35*  --   --  35* 33*  GLUCOSE 163* 191* 141*  --   --  137* 148*  BUN 34* 36* 34*  --   --  32* 35*  CREATININE 1.09 1.18 1.15  --   --  1.27* 1.29*  CALCIUM 8.6* 8.5* 8.3*  --   --  8.5* 8.5*  MG  --   --   --  2.1  --   --   --     Liver Function Tests: No results for input(s): AST, ALT, ALKPHOS, BILITOT, PROT, ALBUMIN in the last 168 hours. No results for input(s): LIPASE, AMYLASE in the last 168 hours. Recent Labs  Lab 01/13/18 0900 01/17/18 0353  AMMONIA 80* 50*    CBC: Recent Labs  Lab 01/15/18 0446 01/16/18 0440 01/17/18 0352 01/18/18 0942 01/19/18 0400  WBC 7.7 7.3 7.0 8.1 6.9  HGB 9.6* 9.7* 9.8* 10.0* 10.3*  HCT 30.7* 32.1* 31.5* 32.6* 33.9*  MCV 79.5 80.9 79.5 79.1  79.4  PLT 173 173 164 226 201    INR: Recent Labs  Lab 01/15/18 0446 01/16/18 0440 01/17/18 0352 01/18/18 0400 01/19/18 0400  INR 1.80 1.83 1.92 1.97 1.82    Other results:    Imaging   No results found.   Medications:     Scheduled Medications: . acetaZOLAMIDE  250 mg Oral BID  . amiodarone  200 mg Oral Daily  . citalopram  20 mg Oral Daily  . digoxin  0.125 mg Oral Daily  . docusate sodium  200 mg Oral QHS  . gabapentin  300 mg Oral BID  . insulin aspart  5 Units Subcutaneous TID WC  . insulin glargine  10 Units Subcutaneous Q2200  . lactulose  30 g Oral TID  . magnesium oxide  400 mg Oral Daily  . metolazone  5 mg Oral BID  . midodrine  10 mg Oral TID WC  . multivitamin with minerals  1 tablet Oral Daily  . pantoprazole  40 mg Oral BID  . potassium chloride  40 mEq Oral BID  . spironolactone  25 mg Oral Daily  .  sucralfate  1 g Oral TID WC & HS  . warfarin  7.5 mg Oral ONCE-1800  . Warfarin - Pharmacist Dosing Inpatient   Does not apply q1800    Infusions: . DOBUTamine 7.5 mcg/kg/min (01/19/18 0716)  . furosemide (LASIX) infusion 30 mg/hr (01/19/18 1042)    PRN Medications: acetaminophen, cyclobenzaprine, ondansetron (ZOFRAN) IV, ondansetron, senna-docusate, sodium chloride flush, traZODone   Patient Profile    Lucas Winograd. is a 62 y.o. male  with history of chronic systolic HF s/p Medtronic ICD 2014, CAD s/p CABG in 2000, HTN, Hx of cocaine abuse, Tobacco abuse, Depression, PAF, and COPD. HM3 placed 09/09/2016.   Admitted from clinic with encephalopathy and acute on chronic CHF. Dobutamine disconnected sometime overnight.     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 Has Medtronic ICD. Admitted with volume overload and acute encephalopathy. He diuresed well again yesterday but CVP still > 20.  LVAD parameters stable.  LDH stable.  CVP remains high. . Continue lasix drip 30 mg per hour.Continue metolazone 5 mg twice a day and add diamox 250 mg twice a day for 3 days.  - Continue dobutamine 7.5 mcg/kg/min (on chronically for RV failure).  -CO-OX 61%. Weight trending down  - Continue midodrine 10 mg tid. - Not a good heart transplant candidate. Has reached end stage biventricular failure.  2. CAD: Stable. No chest pain.    3. DMII: SSI.  4. PAF: Maintaining NSR. On amiodarone.    5. Anemia, iron deficiency: Had capsule endoscopy 04/2017 with severe gastritis. Continue carafate.  -Hgb 10.3 .  6. Anxiety/Depression: Continue celexa. Stable.   7. H/O RUE DVT: On coumadin.   8. Chronic Anticoagulation: On coumadin, INR goal 1.8 - 2.4.  - INR 1.8. Discussed with pharmacy.    9. Cardiac cirrhosis with ascites: Had Paracentesis on 4/10 with 4.8 L out.  S/P Paracentesis 4/15with 2.4 liters removed.   10. Confusion/Hepatic Encephalopathy: NH3  has been high, we have increased lactulose.  - ammonia 80 on 4/12.  - Ammonia down to 50 on 4/16 .  11. Hyponatremia: sodium 128 12. Fall- unwitnessed 4/16. CT of head negative.    Needs to walk more today.    I reviewed the LVAD parameters from today, and compared the results to the patient's prior recorded data.  No programming changes  were made.  The LVAD is functioning within specified parameters.  The patient performs LVAD self-test daily.  LVAD interrogation was negative for any significant power changes, alarms or PI events/speed drops.  LVAD equipment check completed and is in good working order.  Back-up equipment present.   LVAD education done on emergency procedures and precautions and reviewed exit site care.   Length of Stay: [place], NP 01/19/2018, 11:03 AM  VAD Team --- VAD ISSUES ONLY--- Pager [phone] (7am - 7am)  Advanced Heart Failure Team  Pager [phone] (M-F; 7a - 4p)  Please contact [facility] for night-coverage after hours (4p -7a ) and weekends on [email or web address]  Patient seen and examined with Darrick Grinder, NP. We discussed all aspects of the encounter. I agree with the assessment and plan as stated above.   Finally starting to make real progress now that he is cutting back fluid intake. Diuresing well on high-dose lasix, metolazone and diamox. Remains on dobutamine for RV support. Renal function stable. Hepatic encephalopathy improved. INR 1.8. Discussed dosing with PharmD personally. VAD interrogated personally. Parameters stable. Will continue aggressive diuresis at least one more day. Will t-PA CVP line so we can continue to follow.   Glori Bickers, MD  8:22 PM

## 2018-01-20 LAB — GLUCOSE, CAPILLARY
GLUCOSE-CAPILLARY: 175 mg/dL — AB (ref 65–99)
Glucose-Capillary: 196 mg/dL — ABNORMAL HIGH (ref 65–99)
Glucose-Capillary: 192 mg/dL — ABNORMAL HIGH (ref 65–99)
Glucose-Capillary: 193 mg/dL — ABNORMAL HIGH (ref 65–99)

## 2018-01-20 LAB — BASIC METABOLIC PANEL
Anion gap: 13 (ref 5–15)
BUN: 34 mg/dL — AB (ref 6–20)
CHLORIDE: 81 mmol/L — AB (ref 101–111)
CO2: 33 mmol/L — ABNORMAL HIGH (ref 22–32)
CREATININE: 1.27 mg/dL — AB (ref 0.61–1.24)
Calcium: 8.7 mg/dL — ABNORMAL LOW (ref 8.9–10.3)
GFR calc Af Amer: >60 mL/min (ref >60)
GFR calc non Af Amer: 59 mL/min — ABNORMAL LOW (ref >60)
Glucose, Bld: 165 mg/dL — ABNORMAL HIGH (ref 65–99)
POTASSIUM: 3 mmol/L — AB (ref 3.5–5.1)
SODIUM: 127 mmol/L — AB (ref 135–145)

## 2018-01-20 LAB — CBC
HCT: 33.9 % — ABNORMAL LOW (ref 39.0–52.0)
HEMOGLOBIN: 10.5 g/dL — AB (ref 13.0–17.0)
MCH: 24.2 pg — AB (ref 26.0–34.0)
MCHC: 31 g/dL (ref 30.0–36.0)
MCV: 78.3 fL (ref 78.0–100.0)
Platelets: 188 10*3/uL (ref 150–400)
RBC: 4.33 MIL/uL (ref 4.22–5.81)
RDW: 19.1 % — ABNORMAL HIGH (ref 11.5–15.5)
WBC: 7 10*3/uL (ref 4.0–10.5)

## 2018-01-20 LAB — LACTATE DEHYDROGENASE: LDH: 139 U/L (ref 98–192)

## 2018-01-20 LAB — PROTIME-INR
INR: 2.01
PROTHROMBIN TIME: 22.6 s — AB (ref 11.4–15.2)

## 2018-01-20 MED ORDER — TORSEMIDE 20 MG PO TABS
100.0000 mg | ORAL_TABLET | Freq: Two times a day (BID) | ORAL | Status: DC
  Administered 2018-01-21 – 2018-01-22 (×3): 100 mg via ORAL
  Filled 2018-01-20 (×3): qty 5

## 2018-01-20 MED ORDER — POTASSIUM CHLORIDE CRYS ER 20 MEQ PO TBCR
40.0000 meq | EXTENDED_RELEASE_TABLET | Freq: Once | ORAL | Status: AC
  Administered 2018-01-20: 40 meq via ORAL
  Filled 2018-01-20: qty 2

## 2018-01-20 MED ORDER — WARFARIN SODIUM 5 MG PO TABS
5.0000 mg | ORAL_TABLET | Freq: Once | ORAL | Status: AC
  Administered 2018-01-20: 5 mg via ORAL
  Filled 2018-01-20: qty 1

## 2018-01-20 NOTE — Evaluation (Addendum)
Physical Therapy Evaluation Patient Details Name: Ronald Miller. MRN: [id number] DOB: 20-Feb-1956 Today's Date: 01/20/2018   History of Present Illness  62 y.o. male admitted with encephalopathy, volume overload, acute CHF with dobutamine line disconnected. Paracentesis 4/10 and 4/15, fall 4/16. PMHx: chronic systolic HF s/p Medtronic ICD 2014, CAD s/p CABG in 2000, HTN, Hx of cocaine abuse, Tobacco abuse, Depression, PAF, and COPD. HM3 placed 09/09/2016  Clinical Impression  Pt pleasant with decreased cognition, balance, strength, functional mobility and gait who will benefit from acute therapy to maximize independence, safety and gait to decrease fall risk and burden of care. Pt encouraged to mobilize with nursing and will need 24 hr assist at home if cognition does not return to baseline. If family unable to provide 24hr then ST-SNf needed.  Hr 72    Follow Up Recommendations Supervision/Assistance - 24 hour;Home health PT    Equipment Recommendations  None recommended by PT    Recommendations for Other Services       Precautions / Restrictions Precautions Precautions: Fall Precaution Comments: LVAD      Mobility  Bed Mobility               General bed mobility comments: pt sitting EOB on arrival  Transfers Overall transfer level: Needs assistance   Transfers: Sit to/from Stand Sit to Stand: Min guard         General transfer comment: cues for safety with guarding for lines and balance  Ambulation/Gait Ambulation/Gait assistance: Min assist Ambulation Distance (Feet): 50 Feet Assistive device: Rolling walker (2 wheeled) Gait Pattern/deviations: Step-through pattern;Decreased stride length;Trunk flexed   Gait velocity interpretation: <1.31 ft/sec, indicative of household ambulator General Gait Details: pt with min assist for balance and stability in standing. Pt with 3 episodes of partial knee buckling with assist to maintain balance, assist to direct RW x  3  Stairs            Wheelchair Mobility    Modified Rankin (Stroke Patients Only)       Balance Overall balance assessment: Needs assistance   Sitting balance-Leahy Scale: Good       Standing balance-Leahy Scale: Poor                               Pertinent Vitals/Pain Pain Assessment: No/denies pain    Home Living Family/patient expects to be discharged to:: Private residence Living Arrangements: Alone Available Help at Discharge: Family;Available 24 hours/day Type of Home: House Home Access: Stairs to enter Entrance Stairs-Rails: Right Entrance Stairs-Number of Steps: 2 Home Layout: One level Home Equipment: Walker - 4 wheels      Prior Function Level of Independence: Independent with assistive device(s)         Comments: Pt had been functioning Independently with LVAD .  Was driving.     Hand Dominance        Extremity/Trunk Assessment   Upper Extremity Assessment Upper Extremity Assessment: Generalized weakness    Lower Extremity Assessment Lower Extremity Assessment: Generalized weakness    Cervical / Trunk Assessment Cervical / Trunk Assessment: Kyphotic  Communication   Communication: No difficulties  Cognition Arousal/Alertness: Awake/alert Behavior During Therapy: Flat affect Overall Cognitive Status: Impaired/Different from baseline Area of Impairment: Memory;Attention;Following commands;Safety/judgement;Problem solving                   Current Attention Level: Selective Memory: Decreased short-term memory Following Commands: Follows one step commands  with increased time Safety/Judgement: Decreased awareness of deficits   Problem Solving: Slow processing;Decreased initiation;Difficulty sequencing;Requires verbal cues;Requires tactile cues General Comments: pt with bed soaked in urine on arrival with pt unaware, pt with slow processing and unable to transition power sources      General Comments       Exercises     Assessment/Plan    PT Assessment Patient needs continued PT services  PT Problem List Decreased strength;Decreased mobility;Decreased safety awareness;Decreased activity tolerance;Decreased balance;Decreased knowledge of use of DME;Decreased cognition       PT Treatment Interventions Gait training;Therapeutic exercise;Patient/family education;Stair training;Functional mobility training;Balance training;DME instruction;Therapeutic activities;Cognitive remediation    PT Goals (Current goals can be found in the Care Plan section)  Acute Rehab PT Goals Patient Stated Goal: return home PT Goal Formulation: With patient Time For Goal Achievement: 02/03/18 Potential to Achieve Goals: Fair    Frequency Min 3X/week   Barriers to discharge Decreased caregiver support      Co-evaluation               AM-PAC PT "6 Clicks" Daily Activity  Outcome Measure Difficulty turning over in bed (including adjusting bedclothes, sheets and blankets)?: A Little Difficulty moving from lying on back to sitting on the side of the bed? : A Little Difficulty sitting down on and standing up from a chair with arms (e.g., wheelchair, bedside commode, etc,.)?: A Little Help needed moving to and from a bed to chair (including a wheelchair)?: A Little Help needed walking in hospital room?: A Little Help needed climbing 3-5 steps with a railing? : A Lot 6 Click Score: 17    End of Session Equipment Utilized During Treatment: Gait belt Activity Tolerance: Patient tolerated treatment well Patient left: in chair;with call bell/phone within reach;with chair alarm set Nurse Communication: Mobility status;Precautions PT Visit Diagnosis: Other abnormalities of gait and mobility (R26.89);Muscle weakness (generalized) (M62.81);Unsteadiness on feet (R26.81)    Time: 1355-1419 PT Time Calculation (min) (ACUTE ONLY): 24 min   Charges:   PT Evaluation $PT Eval Moderate Complexity: 1 Mod     PT  G Codes:        Elwyn Reach, PT [phone]   [place] B Kazzandra Desaulniers 01/20/2018, 2:30 PM

## 2018-01-20 NOTE — Progress Notes (Signed)
NP contacted about low MAP reading (56) and whether to titrate dobutamine. Currently at 50.   [place]  Demontrae Gilbert, RN

## 2018-01-20 NOTE — Progress Notes (Addendum)
PT Cancellation Note  Patient Details Name: Ronald Miller. MRN: [id number] DOB: Jan 31, 1956   Cancelled Treatment:    Reason Eval/Treat Not Completed: Patient declined, no reason specified(pt refused to mobilize or move. Pt reports he was up all night, just got to sleep and is adamant he will not move until after lunch)Pt educated for benefit of mobility and encouragement provided but pt refused   Tippi Mccrae B Dalya Maselli 01/20/2018, 10:19 AM  Elwyn Reach, [place]

## 2018-01-20 NOTE — Progress Notes (Signed)
Pt refuses to walk right now but agrees to walk with nurse or NT after lunch.   [place]  Doug Bucklin, RN

## 2018-01-20 NOTE — Progress Notes (Signed)
BP taken again 80/65 automatic with MAP of 72; doppler/manual reading now 70. Will continue to monitor.   [place]  Nobel Brar, RN

## 2018-01-20 NOTE — Progress Notes (Addendum)
Patient ID: Ronald Boen., male   DOB: 1956-07-04, 62 y.o.   MRN: [id number]   Advanced Heart Failure VAD Team Note  PCP-Cardiologist: Glori Bickers, MD   Subjective:    Admitted with massive volume overload and disconnected dobutamine line. PICC line appeared cut.   Paracentesis 01/11/18 with 4.8 L off.  Paracentesis 01/16/18 with 2.4 liters off.   Unwitnessed fall on 4/16. CT of head negative.   Continues to diurese with lasix drip, metolazone, diamox. Brisk diuresis noted.  CVP down to 15. Weight down to 187  (down 35 pounds)  Denies SOB. Feeling better. No orthopnea or PND. More alert.   LVAD INTERROGATION:  HeartMate II LVAD:  Flow 5.5  liters/min, speed 5800, power 4.6  PI 2.2   Objective:    Vital Signs:   Temp:  [97.6 F (36.4 C)-97.9 F (36.6 C)] 97.6 F (36.4 C) (04/19 0349) Pulse Rate:  [55-63] 63 (04/19 0936) Resp:  [13-22] 20 (04/19 0815) BP: (92-120)/(34-101) 120/101 (04/19 0815) SpO2:  [95 %-97 %] 95 % (04/19 0815) Weight:  [187 lb 14.4 oz (85.2 kg)] 187 lb 14.4 oz (85.2 kg) (04/19 0500) Last BM Date: 01/18/18 Mean arterial Pressure 70-80  Intake/Output:   Intake/Output Summary (Last 24 hours) at 01/20/2018 1046 Last data filed at 01/20/2018 0939 Gross per 24 hour  Intake 2232.57 ml  Output 5800 ml  Net -3567.43 ml     Physical Exam   CVP 15 Physical Exam: GENERAL: In bed. No acute distress. HEENT: normal anicteric  NECK: Supple, JVP jaw .  2+ bilaterally, no bruits.  No lymphadenopathy or thyromegaly appreciated.   CARDIAC:  Mechanical heart sounds with LVAD hum present.  LUNGS:  Clear to auscultation bilaterally. No wheeze  ABDOMEN:  Soft, round, nontender,  +distended positive bowel sounds x4.     LVAD exit site: well-healed and incorporated.  Dressing dry and intact.  No erythema or drainage.  Stabilization device present and accurately applied.  Driveline dressing is being changed daily per sterile technique. EXTREMITIES:  Warm and dry,  no cyanosis, clubbing, rash. RLE and LLE 1+edema.  Neuro: alert & oriented x 3, cranial nerves grossly intact. moves all 4 extremities w/o difficulty. Affect pleasant   Telemetry  A sensed V paced 60s Personally reviewed   EKG    No new tracings.    Labs   Basic Metabolic Panel: Recent Labs  Lab 01/16/18 0440 01/17/18 0352 01/17/18 0500 01/18/18 0400 01/18/18 1559 01/19/18 0400 01/20/18 0207  NA 127* 127*  --  130* 129* 128* 127*  K 4.0 3.3*  --   --  3.3* 3.6 3.0*  CL 82* 80*  --   --  85* 84* 81*  CO2 36* 35*  --   --  35* 33* 33*  GLUCOSE 191* 141*  --   --  137* 148* 165*  BUN 36* 34*  --   --  32* 35* 34*  CREATININE 1.18 1.15  --   --  1.27* 1.29* 1.27*  CALCIUM 8.5* 8.3*  --   --  8.5* 8.5* 8.7*  MG  --   --  2.1  --   --   --   --     Liver Function Tests: No results for input(s): AST, ALT, ALKPHOS, BILITOT, PROT, ALBUMIN in the last 168 hours. No results for input(s): LIPASE, AMYLASE in the last 168 hours. Recent Labs  Lab 01/17/18 0353  AMMONIA 50*    CBC: Recent Labs  Lab  01/16/18 0440 01/17/18 0352 01/18/18 0942 01/19/18 0400 01/20/18 0207  WBC 7.3 7.0 8.1 6.9 7.0  HGB 9.7* 9.8* 10.0* 10.3* 10.5*  HCT 32.1* 31.5* 32.6* 33.9* 33.9*  MCV 80.9 79.5 79.1 79.4 78.3  PLT 173 164 226 201 188    INR: Recent Labs  Lab 01/16/18 0440 01/17/18 0352 01/18/18 0400 01/19/18 0400 01/20/18 0207  INR 1.83 1.92 1.97 1.82 2.01    Other results:    Imaging   No results found.   Medications:     Scheduled Medications: . acetaZOLAMIDE  250 mg Oral BID  . amiodarone  200 mg Oral Daily  . citalopram  20 mg Oral Daily  . digoxin  0.125 mg Oral Daily  . docusate sodium  200 mg Oral QHS  . gabapentin  300 mg Oral BID  . insulin aspart  5 Units Subcutaneous TID WC  . insulin glargine  10 Units Subcutaneous Q2200  . lactulose  30 g Oral TID  . magnesium oxide  400 mg Oral Daily  . metolazone  5 mg Oral BID  . midodrine  10 mg Oral TID WC  .  multivitamin with minerals  1 tablet Oral Daily  . pantoprazole  40 mg Oral BID  . potassium chloride  40 mEq Oral BID  . potassium chloride  40 mEq Oral Once  . spironolactone  25 mg Oral Daily  . sucralfate  1 g Oral TID WC & HS  . warfarin  5 mg Oral ONCE-1800  . Warfarin - Pharmacist Dosing Inpatient   Does not apply q1800    Infusions: . DOBUTamine 7.5 mcg/kg/min (01/20/18 0345)  . furosemide (LASIX) infusion 30 mg/hr (01/20/18 0345)    PRN Medications: acetaminophen, cyclobenzaprine, ondansetron (ZOFRAN) IV, ondansetron, senna-docusate, sodium chloride flush, traZODone   Patient Profile    Ronald Hark. is a 62 y.o. male  with history of chronic systolic HF s/p Medtronic ICD 2014, CAD s/p CABG in 2000, HTN, Hx of cocaine abuse, Tobacco abuse, Depression, PAF, and COPD. HM3 placed 09/09/2016.   Admitted from clinic with encephalopathy and acute on chronic CHF. Dobutamine disconnected sometime overnight.     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 Has Medtronic ICD. Admitted with volume overload and acute encephalopathy. He diuresed well again yesterday but .  LVAD parameters stable.  LDH stable.  CVP 15. Brisk diuresis noted. Down 35 pounds. Continue lasix drip, metolazone, + diamox today. Stop after today and start torsemide 100 mg twice a day.  - Renal function stable.   - Continue dobutamine 7.5 mcg/kg/min (on chronically for RV failure).  - Continue midodrine 10 mg tid. - Not a good heart transplant candidate. Has reached end stage biventricular failure.  2. CAD: Stable. No chest pain.    3. DMII: SSI.  4. PAF: Maintaining NSR. On amiodarone.    5. Anemia, iron deficiency: Had capsule endoscopy 04/2017 with severe gastritis. Continue carafate.  -hg stable 10.5  6. Anxiety/Depression: Continue celexa. Stable.   7. H/O RUE DVT: On coumadin.   8. Chronic Anticoagulation: On coumadin, INR goal 1.8 - 2.4.  - INR 2.0  . Discussed with pharmacy.    9. Cardiac cirrhosis with ascites: Had Paracentesis on 4/10 with 4.8 L out.  S/P Paracentesis 4/15with 2.4 liters removed.   10. Confusion/Hepatic Encephalopathy: NH3 has been high, we have increased lactulose.  - ammonia 80 on 4/12.  - Ammonia down to 50 on 4/16 .  11. Hyponatremia:  sodium 127 Restrict free water  12. Fall- unwitnessed 4/16. CT of head negative.   Anticipate d/c 24-48 hours.    I reviewed the LVAD parameters from today, and compared the results to the patient's prior recorded data.  No programming changes were made.  The LVAD is functioning within specified parameters.  The patient performs LVAD self-test daily.  LVAD interrogation was negative for any significant power changes, alarms or PI events/speed drops.  LVAD equipment check completed and is in good working order.  Back-up equipment present.   LVAD education done on emergency procedures and precautions and reviewed exit site care.   Length of Stay: [place], NP 01/20/2018, 10:46 AM  VAD Team --- VAD ISSUES ONLY--- Pager [phone] (7am - 7am)  Advanced Heart Failure Team  Pager [phone] (M-F; 7a - 4p)  Please contact [facility] for night-coverage after hours (4p -7a ) and weekends on [email or web address]  Patient seen and examined with Darrick Grinder, NP. We discussed all aspects of the encounter. I agree with the assessment and plan as stated above.   Continues to mobilize fluid well but is requiring high-dose lasix, metolazone and diamox on top of high-dose dobutamine for RV support. Will continue aggressive diuresis at least one more day. Renal function stable. Supp Kcl. Hepatic encephalopathy well controlled on lactulose. INR 2.01. Discussed dosing with PharmD personally. VAD interrogated personally. Parameters stable. Ambulate carefully.   Glori Bickers, MD  11:04 AM

## 2018-01-20 NOTE — Progress Notes (Signed)
LVAD Coordinator Rounding Note:  Admitted 01/11/18 to Dr. Haroldine Laws due to acute encephalopathy and CHF with fluid overload. Home Dobutamine gtt disconnected.     HM III LVAD implanted on 09/09/16 by Dr. Cyndia Bent under Destination Therapy criteria.   Vital signs: Temp:  98.1 HR: 61 AV paced Doppler Pressure:  Not done Automatic BP: 83/71 (78) O2 Sat: 95% on RA Wt: 220>222>213>208>212>216>198>187 lbs  LVAD interrogation reveals:  Speed: 5800 Flow: 5.4 Power:  4.5w PI:  2.4 Alarms: none Events: none Hematocrit:  34 Fixed speed:  5800 Low speed limit: 5500  Drive Line: left abdominal sorbaview dressing dry and intact; anchor intact and accurately applied. Change weekly; due [facility] April 25. Driveline anchored and secured.  Labs:  LDH trend: 159>136>140>141>132>132>137>139  INR trend: 2.2>2.21>2.82>3.04>1.83>1.91>1.97>1.82>2.01  Gtts: Lasix 30 mg/hr Dobutamine 7.5 mcg/kg/min  Anticoagulation Plan: -INR Goal:  1.8 - 2.3 -ASA Dose: no ASA (history of GI bleeding)  Device: -Protect; end of service 08/2016 -Therapies:  Off Last check: complete interrogation 05/26/2017. DDD (lower rate 60); BiV pacing with 96% AS-VP  Adverse Events on VAD: 11/30/16> Milrinone gtt at discharge 01/27/17>dobutamine at 5 mcg/kg/mingtt at discharge 03/08/17> RV failure 04/29/17> symptomatic anemia and melena. EGD revealed stomach ulcers. Carafate added; ASA stopped 05/31/17>Ongoing symptomatic anemia and melena; capsule study mild antral gastritis; poss tiny AVM right colon. Lower MAPS, Losartan cut back. 06/2017> admit with syncope, AMS, Dobutamine at 7.52mcg 11/09/17>Marked ascites, RV failure, and hepatic encephalopathy. Paracentesis on 2/7 and 2/12 with >11 liters fluid removed 12/27/17>Outpatient paracentesis with 2.8 liters removed 01/11/18>IP paracentesis with 4.8 liters removed   Plan/Recommendations:  1. Will need weekly dressing changes; due [facility] April 25. Bedside RN please change dressing  today. 2. Please call VAD pager with any VAD equipment or drive line issues. 3. If pts discharges over weekend please page VAD coordinator for pt follow up appt.  Tanda Rockers RN, VAD Coordiantor 24/7 VAD pager: [phone]

## 2018-01-20 NOTE — Progress Notes (Signed)
[place] for Warfarin Indication: LVAD  Allergies  Allergen Reactions  . Chlorhexidine Gluconate Itching and Rash  . Codeine Nausea And Vomiting and Other (See Comments)    In high doses  . Lipitor [Atorvastatin] Nausea Only and Other (See Comments)    Nausea with high doses, tolerates 20mg  dose (08/22/16)  . Tape Other (See Comments)    Paper tape is ok   Patient Measurements: Height: 5\' 9"  (175.3 cm) Weight: 187 lb 14.4 oz (85.2 kg) IBW/kg (Calculated) : 70.7 Height: 5'9"  Vital Signs: Temp: 97.6 F (36.4 C) (04/19 0349) Temp Source: Oral (04/19 0349) BP: 120/101 (04/19 0815) Pulse Rate: 63 (04/19 0936)  Labs: Recent Labs    01/18/18 0400  01/18/18 0942 01/18/18 1559 01/19/18 0400 01/20/18 0207  HGB  --    < > 10.0*  --  10.3* 10.5*  HCT  --   --  32.6*  --  33.9* 33.9*  PLT  --   --  226  --  201 188  LABPROT 22.2*  --   --   --  20.9* 22.6*  INR 1.97  --   --   --  1.82 2.01  CREATININE  --   --   --  1.27* 1.29* 1.27*   < > = values in this interval not displayed.   Estimated Creatinine Clearance: 66.1 mL/min (A) (by C-G formula based on SCr of 1.27 mg/dL (H)).  Assessment: 34 yoM on warfarin PTA for LVAD. INR within goal today at 2.01. Hgb 10.3, pltc WNL - stable. No bleeding noted.  S/p second paracentesis 4/15 (yielded 2.4 liters clear yellow fluid). Patient fell and hit his head on 4/16 - head CT negative.   PTA regimen: 7.5mg  on all days except 5mg  on Mondays and Fridays (admit INR 2.2)  Goal of Therapy:  INR 1.8-2.4 Monitor platelets by anticoagulation protocol: Yes   Plan:  Warfarin 5 mg PO x1 tonight Daily INR and CBC Monitor for s/sx of bleeding  Erin N. Gerarda Fraction, PharmD PGY1 Pharmacy Resident Pager: [phone] 01/20/18   10:44 AM

## 2018-01-21 LAB — CBC
HCT: 33.3 % — ABNORMAL LOW (ref 39.0–52.0)
HEMOGLOBIN: 10.5 g/dL — AB (ref 13.0–17.0)
MCH: 24.4 pg — ABNORMAL LOW (ref 26.0–34.0)
MCHC: 31.5 g/dL (ref 30.0–36.0)
MCV: 77.4 fL — ABNORMAL LOW (ref 78.0–100.0)
PLATELETS: 195 10*3/uL (ref 150–400)
RBC: 4.3 MIL/uL (ref 4.22–5.81)
RDW: 19.4 % — AB (ref 11.5–15.5)
WBC: 7.3 10*3/uL (ref 4.0–10.5)

## 2018-01-21 LAB — GLUCOSE, CAPILLARY
GLUCOSE-CAPILLARY: 201 mg/dL — AB (ref 65–99)
Glucose-Capillary: 146 mg/dL — ABNORMAL HIGH (ref 65–99)
Glucose-Capillary: 155 mg/dL — ABNORMAL HIGH (ref 65–99)
Glucose-Capillary: 194 mg/dL — ABNORMAL HIGH (ref 65–99)

## 2018-01-21 LAB — BASIC METABOLIC PANEL
Anion gap: 13 (ref 5–15)
BUN: 44 mg/dL — ABNORMAL HIGH (ref 6–20)
CALCIUM: 8.8 mg/dL — AB (ref 8.9–10.3)
CO2: 33 mmol/L — ABNORMAL HIGH (ref 22–32)
CREATININE: 1.41 mg/dL — AB (ref 0.61–1.24)
Chloride: 79 mmol/L — ABNORMAL LOW (ref 101–111)
GFR, EST NON AFRICAN AMERICAN: 52 mL/min — AB (ref >60)
Glucose, Bld: 149 mg/dL — ABNORMAL HIGH (ref 65–99)
Potassium: 3.3 mmol/L — ABNORMAL LOW (ref 3.5–5.1)
SODIUM: 125 mmol/L — AB (ref 135–145)

## 2018-01-21 LAB — COOXEMETRY PANEL
Carboxyhemoglobin: 1.6 % — ABNORMAL HIGH (ref 0.5–1.5)
Methemoglobin: 1.3 % (ref 0.0–1.5)
O2 Saturation: 55.7 %
TOTAL HEMOGLOBIN: 14.3 g/dL (ref 12.0–16.0)

## 2018-01-21 LAB — PROTIME-INR
INR: 1.92
PROTHROMBIN TIME: 21.8 s — AB (ref 11.4–15.2)

## 2018-01-21 LAB — AMMONIA: AMMONIA: 63 umol/L — AB (ref 9–35)

## 2018-01-21 LAB — LACTATE DEHYDROGENASE: LDH: 136 U/L (ref 98–192)

## 2018-01-21 MED ORDER — WARFARIN SODIUM 7.5 MG PO TABS
7.5000 mg | ORAL_TABLET | Freq: Once | ORAL | Status: AC
  Administered 2018-01-21: 7.5 mg via ORAL
  Filled 2018-01-21: qty 1

## 2018-01-21 MED ORDER — POTASSIUM CHLORIDE CRYS ER 20 MEQ PO TBCR
40.0000 meq | EXTENDED_RELEASE_TABLET | Freq: Once | ORAL | Status: AC
Start: 2018-01-21 — End: 2018-01-21
  Administered 2018-01-21: 40 meq via ORAL
  Filled 2018-01-21: qty 2

## 2018-01-21 NOTE — Progress Notes (Signed)
Text paged HF x2 about expired lasix gtt order and whether to officially d/c being that we started torsemide PO. Will continue to monitor.   [place]  Alonza Knisley, RN

## 2018-01-21 NOTE — Progress Notes (Signed)
[place] for Warfarin Indication: LVAD  Allergies  Allergen Reactions  . Chlorhexidine Gluconate Itching and Rash  . Codeine Nausea And Vomiting and Other (See Comments)    In high doses  . Lipitor [Atorvastatin] Nausea Only and Other (See Comments)    Nausea with high doses, tolerates 20mg  dose (08/22/16)  . Tape Other (See Comments)    Paper tape is ok   Patient Measurements: Height: 5\' 9"  (175.3 cm) Weight: 185 lb 4.8 oz (84.1 kg) IBW/kg (Calculated) : 70.7 Height: 5'9"  Vital Signs: Temp: 97.7 F (36.5 C) (04/20 1201) Temp Source: Oral (04/20 1201) BP: 94/45 (04/20 1201) Pulse Rate: 60 (04/20 1201)  Labs: Recent Labs    01/19/18 0400 01/20/18 0207 01/21/18 0500  HGB 10.3* 10.5* 10.5*  HCT 33.9* 33.9* 33.3*  PLT 201 188 195  LABPROT 20.9* 22.6* 21.8*  INR 1.82 2.01 1.92  CREATININE 1.29* 1.27* 1.41*   Estimated Creatinine Clearance: 55 mL/min (A) (by C-G formula based on SCr of 1.41 mg/dL (H)).  Assessment: 22 yoM on warfarin PTA for LVAD. INR within goal today at 1.92, CBC and LDH stable.  PTA regimen: 7.5mg  on all days except 5mg  on Mondays and Fridays (admit INR 2.2)  Goal of Therapy:  INR 1.8-2.4 Monitor platelets by anticoagulation protocol: Yes   Plan:  Warfarin 7.5 mg PO x1 tonight Daily INR and CBC Monitor for s/sx of bleeding  Arrie Senate, PharmD, BCPS PGY-2 Cardiology Pharmacy Resident Pager: [phone] 01/21/2018

## 2018-01-21 NOTE — Progress Notes (Signed)
Patient ID: Ronald Miller., male   DOB: Apr 19, 1956, 62 y.o.   MRN: [id number]   Advanced Heart Failure VAD Team Note  PCP-Cardiologist: Glori Bickers, MD   Subjective:    Admitted with massive volume overload and disconnected dobutamine line. PICC line appeared cut.   Paracentesis 01/11/18 with 4.8 L off.  Paracentesis 01/16/18 with 2.4 liters off.   Unwitnessed fall on 4/16. CT of head negative.   Lasix drip stopped this am. Now back on torsemide. CVP 6. Denies SOB. No PND, orthopnea or CP. More tremulous today.     LVAD INTERROGATION:  HeartMate II LVAD:  Flow 5.3  liters/min, speed 5800, power 4.2  PI 2.1  Objective:    Vital Signs:   Temp:  [97.7 F (36.5 C)-98.7 F (37.1 C)] 97.7 F (36.5 C) (04/20 1201) Pulse Rate:  [60-70] 60 (04/20 1201) Resp:  [12-23] 16 (04/20 1201) BP: (61-109)/(39-81) 94/45 (04/20 1201) SpO2:  [97 %-99 %] 98 % (04/20 1201) Weight:  [84.1 kg (185 lb 4.8 oz)] 84.1 kg (185 lb 4.8 oz) (04/20 0500) Last BM Date: 01/20/18 Mean arterial Pressure 70-80s Intake/Output:   Intake/Output Summary (Last 24 hours) at 01/21/2018 1452 Last data filed at 01/21/2018 1154 Gross per 24 hour  Intake 1485.9 ml  Output 3225 ml  Net -1739.1 ml     Physical Exam   General:  Sitting on side of bed. Tremulous. NAD.  HEENT: normal  Neck: supple. JVP 6-7 Carotids 2+ bilat; no bruits. No lymphadenopathy or thryomegaly appreciated. Cor: LVAD hum.  Lungs: Clear. Abdomen: obese soft, nontender, mildly distended. No hepatosplenomegaly. No bruits or masses. Good bowel sounds. Driveline site clean. Anchor in place.  Extremities: no cyanosis, clubbing, rash. Warm trace edema  Neuro: alert & oriented x 3. Moves all 4 without problem +asterixis   Telemetry  A sensed V paced 60s + PVCs. Personally reviewed  EKG    No new tracings.    Labs   Basic Metabolic Panel: Recent Labs  Lab 01/17/18 0352 01/17/18 0500 01/18/18 0400 01/18/18 1559 01/19/18 0400  01/20/18 0207 01/21/18 0500  NA 127*  --  130* 129* 128* 127* 125*  K 3.3*  --   --  3.3* 3.6 3.0* 3.3*  CL 80*  --   --  85* 84* 81* 79*  CO2 35*  --   --  35* 33* 33* 33*  GLUCOSE 141*  --   --  137* 148* 165* 149*  BUN 34*  --   --  32* 35* 34* 44*  CREATININE 1.15  --   --  1.27* 1.29* 1.27* 1.41*  CALCIUM 8.3*  --   --  8.5* 8.5* 8.7* 8.8*  MG  --  2.1  --   --   --   --   --     Liver Function Tests: No results for input(s): AST, ALT, ALKPHOS, BILITOT, PROT, ALBUMIN in the last 168 hours. No results for input(s): LIPASE, AMYLASE in the last 168 hours. Recent Labs  Lab 01/17/18 0353 01/21/18 0500  AMMONIA 50* 63*    CBC: Recent Labs  Lab 01/17/18 0352 01/18/18 0942 01/19/18 0400 01/20/18 0207 01/21/18 0500  WBC 7.0 8.1 6.9 7.0 7.3  HGB 9.8* 10.0* 10.3* 10.5* 10.5*  HCT 31.5* 32.6* 33.9* 33.9* 33.3*  MCV 79.5 79.1 79.4 78.3 77.4*  PLT 164 226 201 188 195    INR: Recent Labs  Lab 01/17/18 0352 01/18/18 0400 01/19/18 0400 01/20/18 0207 01/21/18 0500  INR  1.92 1.97 1.82 2.01 1.92    Other results:    Imaging   No results found.   Medications:     Scheduled Medications: . amiodarone  200 mg Oral Daily  . citalopram  20 mg Oral Daily  . digoxin  0.125 mg Oral Daily  . docusate sodium  200 mg Oral QHS  . gabapentin  300 mg Oral BID  . insulin aspart  5 Units Subcutaneous TID WC  . insulin glargine  10 Units Subcutaneous Q2200  . lactulose  30 g Oral TID  . magnesium oxide  400 mg Oral Daily  . metolazone  5 mg Oral BID  . midodrine  10 mg Oral TID WC  . multivitamin with minerals  1 tablet Oral Daily  . pantoprazole  40 mg Oral BID  . potassium chloride  40 mEq Oral BID  . spironolactone  25 mg Oral Daily  . sucralfate  1 g Oral TID WC & HS  . torsemide  100 mg Oral BID  . warfarin  7.5 mg Oral ONCE-1800  . Warfarin - Pharmacist Dosing Inpatient   Does not apply q1800    Infusions: . DOBUTamine 7.5 mcg/kg/min (01/21/18 0451)    PRN  Medications: acetaminophen, cyclobenzaprine, ondansetron (ZOFRAN) IV, ondansetron, senna-docusate, sodium chloride flush, traZODone   Patient Profile    Ronald Miller. is a 62 y.o. male  with history of chronic systolic HF s/p Medtronic ICD 2014, CAD s/p CABG in 2000, HTN, Hx of cocaine abuse, Tobacco abuse, Depression, PAF, and COPD. HM3 placed 09/09/2016.   Admitted from clinic with encephalopathy and acute on chronic CHF. Dobutamine disconnected sometime overnight.    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 Has Medtronic ICD. Admitted with volume overload and acute encephalopathy. He diuresed well again yesterday but .  LVAD parameters stable.  LDH stable.  Now down  35 pounds. CVP 6 (checked personally) - Off IV lasix. Back on home torsemide. Stop metolazone and diamox - Renal function relatively stable. Creatinine 1.27->1.41 - Continue dobutamine 7.5 mcg/kg/min (on chronically for RV failure). Co-ox 56% - Continue midodrine 10 mg tid. - Not a good heart transplant candidate. Has reached end stage biventricular failure.  - VAD interrogated personally. Parameters stable. 2. CAD: Stable. No chest pain.    3. DMII: SSI.  4. PAF: Maintaining NSR. On amiodarone.    5. Anemia, iron deficiency: Had capsule endoscopy 04/2017 with severe gastritis. Continue carafate.  -hg stable 10.5 6. Anxiety/Depression: Continue celexa. Stable.   7. H/O RUE DVT: On coumadin.   8. Chronic Anticoagulation: On coumadin, INR goal 1.8 - 2.4.  - INR 1.9 . Discussed dosing with PharmD personally. 9. Cardiac cirrhosis with ascites: Had Paracentesis on 4/10 with 4.8 L out.  S/P Paracentesis 4/15with 2.4 liters removed.   10. Confusion/Hepatic Encephalopathy: NH3 has been high, we have increased lactulose.  - ammonia 80 on 4/12.  - Ammonia down to 50 on 4/16  - Ammonia 63 today but more tremulous + asterixis. Recheck in am. Continue alctulose 11.  Hyponatremia: sodium 125 Continue to restrict free water  12. Fall- unwitnessed 4/16. CT of head negative.    I reviewed the LVAD parameters from today, and compared the results to the patient's prior recorded data.  No programming changes were made.  The LVAD is functioning within specified parameters.  The patient performs LVAD self-test daily.  LVAD interrogation was negative for any significant power changes, alarms or PI events/speed  drops.  LVAD equipment check completed and is in good working order.  Back-up equipment present.   LVAD education done on emergency procedures and precautions and reviewed exit site care.   Length of Stay: 71  Glori Bickers, MD 01/21/2018, 2:52 PM  VAD Team --- VAD ISSUES ONLY--- Pager [phone] (7am - 7am)  Advanced Heart Failure Team  Pager [phone] (M-F; 7a - 4p)  Please contact [facility] for night-coverage after hours (4p -7a ) and weekends on [email or web address]

## 2018-01-22 LAB — GLUCOSE, CAPILLARY
GLUCOSE-CAPILLARY: 206 mg/dL — AB (ref 65–99)
GLUCOSE-CAPILLARY: 197 mg/dL — AB (ref 65–99)
GLUCOSE-CAPILLARY: 207 mg/dL — AB (ref 65–99)
Glucose-Capillary: 262 mg/dL — ABNORMAL HIGH (ref 65–99)

## 2018-01-22 LAB — CBC
HEMATOCRIT: 35.4 % — AB (ref 39.0–52.0)
HEMOGLOBIN: 11.1 g/dL — AB (ref 13.0–17.0)
MCH: 24.1 pg — AB (ref 26.0–34.0)
MCHC: 31.4 g/dL (ref 30.0–36.0)
MCV: 76.8 fL — AB (ref 78.0–100.0)
Platelets: 207 10*3/uL (ref 150–400)
RBC: 4.61 MIL/uL (ref 4.22–5.81)
RDW: 18.9 % — ABNORMAL HIGH (ref 11.5–15.5)
WBC: 6.4 10*3/uL (ref 4.0–10.5)

## 2018-01-22 LAB — BASIC METABOLIC PANEL
Anion gap: 14 (ref 5–15)
BUN: 51 mg/dL — AB (ref 6–20)
CHLORIDE: 78 mmol/L — AB (ref 101–111)
CO2: 33 mmol/L — AB (ref 22–32)
CREATININE: 1.76 mg/dL — AB (ref 0.61–1.24)
Calcium: 9.2 mg/dL (ref 8.9–10.3)
GFR calc Af Amer: 46 mL/min — ABNORMAL LOW (ref >60)
GFR calc non Af Amer: 40 mL/min — ABNORMAL LOW (ref >60)
Glucose, Bld: 157 mg/dL — ABNORMAL HIGH (ref 65–99)
Potassium: 3.8 mmol/L (ref 3.5–5.1)
Sodium: 125 mmol/L — ABNORMAL LOW (ref 135–145)

## 2018-01-22 LAB — LACTATE DEHYDROGENASE: LDH: 127 U/L (ref 98–192)

## 2018-01-22 LAB — PROTIME-INR
INR: 1.96
Prothrombin Time: 22.1 seconds — ABNORMAL HIGH (ref 11.4–15.2)

## 2018-01-22 LAB — AMMONIA: AMMONIA: 67 umol/L — AB (ref 9–35)

## 2018-01-22 MED ORDER — LACTULOSE 10 GM/15ML PO SOLN
45.0000 g | Freq: Three times a day (TID) | ORAL | Status: DC
  Administered 2018-01-22 – 2018-01-27 (×13): 45 g via ORAL
  Filled 2018-01-22 (×17): qty 90

## 2018-01-22 MED ORDER — WARFARIN SODIUM 7.5 MG PO TABS
7.5000 mg | ORAL_TABLET | Freq: Once | ORAL | Status: AC
  Administered 2018-01-22: 7.5 mg via ORAL

## 2018-01-22 NOTE — Progress Notes (Addendum)
Patient ID: Ronald Miller., male   DOB: 12-05-1955, 62 y.o.   MRN: [id number]   Advanced Heart Failure VAD Team Note  PCP-Cardiologist: Glori Bickers, MD   Subjective:    Admitted with massive volume overload and disconnected dobutamine line. PICC line appeared cut.   Paracentesis 01/11/18 with 4.8 L off.  Paracentesis 01/16/18 with 2.4 liters off.   Unwitnessed fall on 4/16. CT of head negative.   Back on oral torsemide. Weight down another 2 pounds. (40 pounds total). Creatinine up 1.4-> 1.7  Denies dyspnea. No orthopnea or PND. Still tremulous. Ammonia 67. CVP 12    LVAD INTERROGATION:  HeartMate II LVAD:  Flow 5.1  liters/min, speed 5800, power 4.4  PI 2.0  Objective:    Vital Signs:   Temp:  [97.5 F (36.4 C)-98.2 F (36.8 C)] 97.9 F (36.6 C) (04/21 0847) Pulse Rate:  [60-64] 64 (04/21 0310) Resp:  [14-18] 17 (04/21 0847) BP: (87-113)/(45-95) 113/95 (04/21 0847) SpO2:  [97 %-99 %] 98 % (04/21 0310) Weight:  [83.2 kg (183 lb 6.8 oz)] 83.2 kg (183 lb 6.8 oz) (04/21 0614) Last BM Date: 01/20/18 Mean arterial Pressure 70-80s Intake/Output:   Intake/Output Summary (Last 24 hours) at 01/22/2018 0905 Last data filed at 01/22/2018 0800 Gross per 24 hour  Intake 786.7 ml  Output 3850 ml  Net -3063.3 ml     Physical Exam   General:  Lying in bed. Tremulous HEENT: normal  Neck: supple. JVP 10-12  Carotids 2+ bilat; no bruits. No lymphadenopathy or thryomegaly appreciated. Cor: LVAD hum.  Lungs: Clear. Abdomen: obese soft, nontender, non-distended. No hepatosplenomegaly. No bruits or masses. Good bowel sounds. Driveline site clean. Anchor in place.  Extremities: no cyanosis, clubbing, rash. Warm. No edema  Neuro: alert & oriented x 3. No focal deficits. Moves all 4 without problem +tremulous/asterixis   Telemetry  A sensed V paced 60s with PVCs Personally reviewed  EKG    No new tracings.    Labs   Basic Metabolic Panel: Recent Labs  Lab 01/17/18 0500   01/18/18 1559 01/19/18 0400 01/20/18 0207 01/21/18 0500 01/22/18 0428  NA  --    < > 129* 128* 127* 125* 125*  K  --   --  3.3* 3.6 3.0* 3.3* 3.8  CL  --   --  85* 84* 81* 79* 78*  CO2  --   --  35* 33* 33* 33* 33*  GLUCOSE  --   --  137* 148* 165* 149* 157*  BUN  --   --  32* 35* 34* 44* 51*  CREATININE  --   --  1.27* 1.29* 1.27* 1.41* 1.76*  CALCIUM  --   --  8.5* 8.5* 8.7* 8.8* 9.2  MG 2.1  --   --   --   --   --   --    < > = values in this interval not displayed.    Liver Function Tests: No results for input(s): AST, ALT, ALKPHOS, BILITOT, PROT, ALBUMIN in the last 168 hours. No results for input(s): LIPASE, AMYLASE in the last 168 hours. Recent Labs  Lab 01/17/18 0353 01/21/18 0500 01/22/18 0427  AMMONIA 50* 63* 67*    CBC: Recent Labs  Lab 01/18/18 0942 01/19/18 0400 01/20/18 0207 01/21/18 0500 01/22/18 0428  WBC 8.1 6.9 7.0 7.3 6.4  HGB 10.0* 10.3* 10.5* 10.5* 11.1*  HCT 32.6* 33.9* 33.9* 33.3* 35.4*  MCV 79.1 79.4 78.3 77.4* 76.8*  PLT 226 201 188  195 207    INR: Recent Labs  Lab 01/18/18 0400 01/19/18 0400 01/20/18 0207 01/21/18 0500 01/22/18 0428  INR 1.97 1.82 2.01 1.92 1.96    Other results:    Imaging   No results found.   Medications:     Scheduled Medications: . amiodarone  200 mg Oral Daily  . citalopram  20 mg Oral Daily  . digoxin  0.125 mg Oral Daily  . docusate sodium  200 mg Oral QHS  . gabapentin  300 mg Oral BID  . insulin aspart  5 Units Subcutaneous TID WC  . insulin glargine  10 Units Subcutaneous Q2200  . lactulose  30 g Oral TID  . magnesium oxide  400 mg Oral Daily  . midodrine  10 mg Oral TID WC  . multivitamin with minerals  1 tablet Oral Daily  . pantoprazole  40 mg Oral BID  . potassium chloride  40 mEq Oral BID  . spironolactone  25 mg Oral Daily  . sucralfate  1 g Oral TID WC & HS  . torsemide  100 mg Oral BID  . Warfarin - Pharmacist Dosing Inpatient   Does not apply q1800    Infusions: .  DOBUTamine 7.5 mcg/kg/min (01/22/18 0621)    PRN Medications: acetaminophen, cyclobenzaprine, ondansetron (ZOFRAN) IV, ondansetron, senna-docusate, sodium chloride flush, traZODone   Patient Profile    Ronald Miller. is a 62 y.o. male  with history of chronic systolic HF s/p Medtronic ICD 2014, CAD s/p CABG in 2000, HTN, Hx of cocaine abuse, Tobacco abuse, Depression, PAF, and COPD. HM3 placed 09/09/2016.   Admitted from clinic with encephalopathy and acute on chronic CHF. Dobutamine disconnected sometime overnight.    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 Has Medtronic ICD. Admitted with volume overload and acute encephalopathy. He diuresed well again yesterday but .  LVAD parameters stable.  LDH stable.  Now down 40 pounds. CVP 6 (checked personally) - Off IV lasix. Back on home torsemide. Weight down 2 more pounds and creatinine up. MAP soft at 70.  Already got torsemide this am. Will give 500cc NS. Hold torsemide. Stop metolazone and diamox - Renal function worse due to diuresis. Creatinine 1.27->1.41->1.7 - Continue dobutamine 7.5 mcg/kg/min (on chronically for RV failure). Co-ox not drawn - Continue midodrine 10 mg tid. - Not a good heart transplant candidate. Has reached end stage biventricular failure.  - VAD interrogated personally. Parameters stable. - Suspect he will need SNF ([facility]) on d/c 2. CAD: Stable. No chest pain.    3. DMII: SSI.  4. PAF: Maintaining NSR. On amiodarone.    5. Anemia, iron deficiency: Had capsule endoscopy 04/2017 with severe gastritis. Continue carafate.  -hg improved 10.5->11.1 6. Anxiety/Depression: Continue celexa. Stable.   7. H/O RUE DVT: On coumadin.   8. Chronic Anticoagulation: On coumadin, INR goal 1.8 - 2.4.  - INR 1.96 . Discussed dosing with PharmD personally. 9. Cardiac cirrhosis with ascites: Had Paracentesis on 4/10 with 4.8 L out.  S/P Paracentesis 4/15with 2.4 liters  removed.   10. Confusion/Hepatic Encephalopathy: NH3 has been high, we have increased lactulose.  - ammonia 80 on 4/12.  - Ammonia down to 50 on 4/16  - Ammonia 67 today but more tremulous + asterixis. Increase lactulose to 45 tid 11. Hyponatremia: sodium stable at 125 Continue to restrict free water  12. Fall- unwitnessed 4/16. CT of head negative.     I reviewed the LVAD parameters from today, and  compared the results to the patient's prior recorded data.  No programming changes were made.  The LVAD is functioning within specified parameters.  The patient performs LVAD self-test daily.  LVAD interrogation was negative for any significant power changes, alarms or PI events/speed drops.  LVAD equipment check completed and is in good working order.  Back-up equipment present.   LVAD education done on emergency procedures and precautions and reviewed exit site care.   Length of Stay: [place], MD 01/22/2018, 9:05 AM  VAD Team --- VAD ISSUES ONLY--- Pager [phone] (7am - 7am)  Advanced Heart Failure Team  Pager [phone] (M-F; 7a - 4p)  Please contact [facility] for night-coverage after hours (4p -7a ) and weekends on [email or web address]

## 2018-01-22 NOTE — Progress Notes (Signed)
[place] for Warfarin Indication: LVAD  Allergies  Allergen Reactions  . Chlorhexidine Gluconate Itching and Rash  . Codeine Nausea And Vomiting and Other (See Comments)    In high doses  . Lipitor [Atorvastatin] Nausea Only and Other (See Comments)    Nausea with high doses, tolerates 20mg  dose (08/22/16)  . Tape Other (See Comments)    Paper tape is ok   Patient Measurements: Height: 5\' 9"  (175.3 cm) Weight: 183 lb 6.8 oz (83.2 kg) IBW/kg (Calculated) : 70.7 Height: 5'9"  Vital Signs: Temp: 97.9 F (36.6 C) (04/21 0847) Temp Source: Oral (04/21 0847) BP: 113/95 (04/21 0847) Pulse Rate: 72 (04/21 0847)  Labs: Recent Labs    01/20/18 0207 01/21/18 0500 01/22/18 0428  HGB 10.5* 10.5* 11.1*  HCT 33.9* 33.3* 35.4*  PLT 188 195 207  LABPROT 22.6* 21.8* 22.1*  INR 2.01 1.92 1.96  CREATININE 1.27* 1.41* 1.76*   Estimated Creatinine Clearance: 44.1 mL/min (A) (by C-G formula based on SCr of 1.76 mg/dL (H)).  Assessment: 43 yoM on warfarin PTA for LVAD. INR within goal today at 1.96, CBC and LDH stable.  PTA regimen: 7.5mg  on all days except 5mg  on Mondays and Fridays (admit INR 2.2)  Goal of Therapy:  INR 1.8-2.4 Monitor platelets by anticoagulation protocol: Yes   Plan:  Warfarin 7.5 mg PO x1 tonight Daily INR and CBC Monitor for s/sx of bleeding  Arrie Senate, PharmD, BCPS PGY-2 Cardiology Pharmacy Resident Pager: [phone] 01/22/2018

## 2018-01-23 LAB — GLUCOSE, CAPILLARY
GLUCOSE-CAPILLARY: 137 mg/dL — AB (ref 65–99)
GLUCOSE-CAPILLARY: 180 mg/dL — AB (ref 65–99)
Glucose-Capillary: 152 mg/dL — ABNORMAL HIGH (ref 65–99)
Glucose-Capillary: 220 mg/dL — ABNORMAL HIGH (ref 65–99)

## 2018-01-23 LAB — CBC
HEMATOCRIT: 33 % — AB (ref 39.0–52.0)
Hemoglobin: 10.4 g/dL — ABNORMAL LOW (ref 13.0–17.0)
MCH: 24.2 pg — ABNORMAL LOW (ref 26.0–34.0)
MCHC: 31.5 g/dL (ref 30.0–36.0)
MCV: 76.7 fL — ABNORMAL LOW (ref 78.0–100.0)
PLATELETS: 187 10*3/uL (ref 150–400)
RBC: 4.3 MIL/uL (ref 4.22–5.81)
RDW: 19.2 % — AB (ref 11.5–15.5)
WBC: 6.7 10*3/uL (ref 4.0–10.5)

## 2018-01-23 LAB — BASIC METABOLIC PANEL
ANION GAP: 14 (ref 5–15)
BUN: 54 mg/dL — ABNORMAL HIGH (ref 6–20)
CALCIUM: 8.9 mg/dL (ref 8.9–10.3)
CO2: 30 mmol/L (ref 22–32)
CREATININE: 1.47 mg/dL — AB (ref 0.61–1.24)
Chloride: 78 mmol/L — ABNORMAL LOW (ref 101–111)
GFR calc Af Amer: 58 mL/min — ABNORMAL LOW (ref >60)
GFR, EST NON AFRICAN AMERICAN: 50 mL/min — AB (ref >60)
GLUCOSE: 154 mg/dL — AB (ref 65–99)
Potassium: 3 mmol/L — ABNORMAL LOW (ref 3.5–5.1)
Sodium: 122 mmol/L — ABNORMAL LOW (ref 135–145)

## 2018-01-23 LAB — PROTIME-INR
INR: 1.95
Prothrombin Time: 22.1 seconds — ABNORMAL HIGH (ref 11.4–15.2)

## 2018-01-23 LAB — LACTATE DEHYDROGENASE: LDH: 124 U/L (ref 98–192)

## 2018-01-23 IMAGING — CR DG CHEST 2V
2 series · 2 of 2 positions shown · non-contrast
Comparison: August 22, 2016

CLINICAL DATA: Shortness of breath

EXAM:
CHEST  2 VIEW

[chest pa]
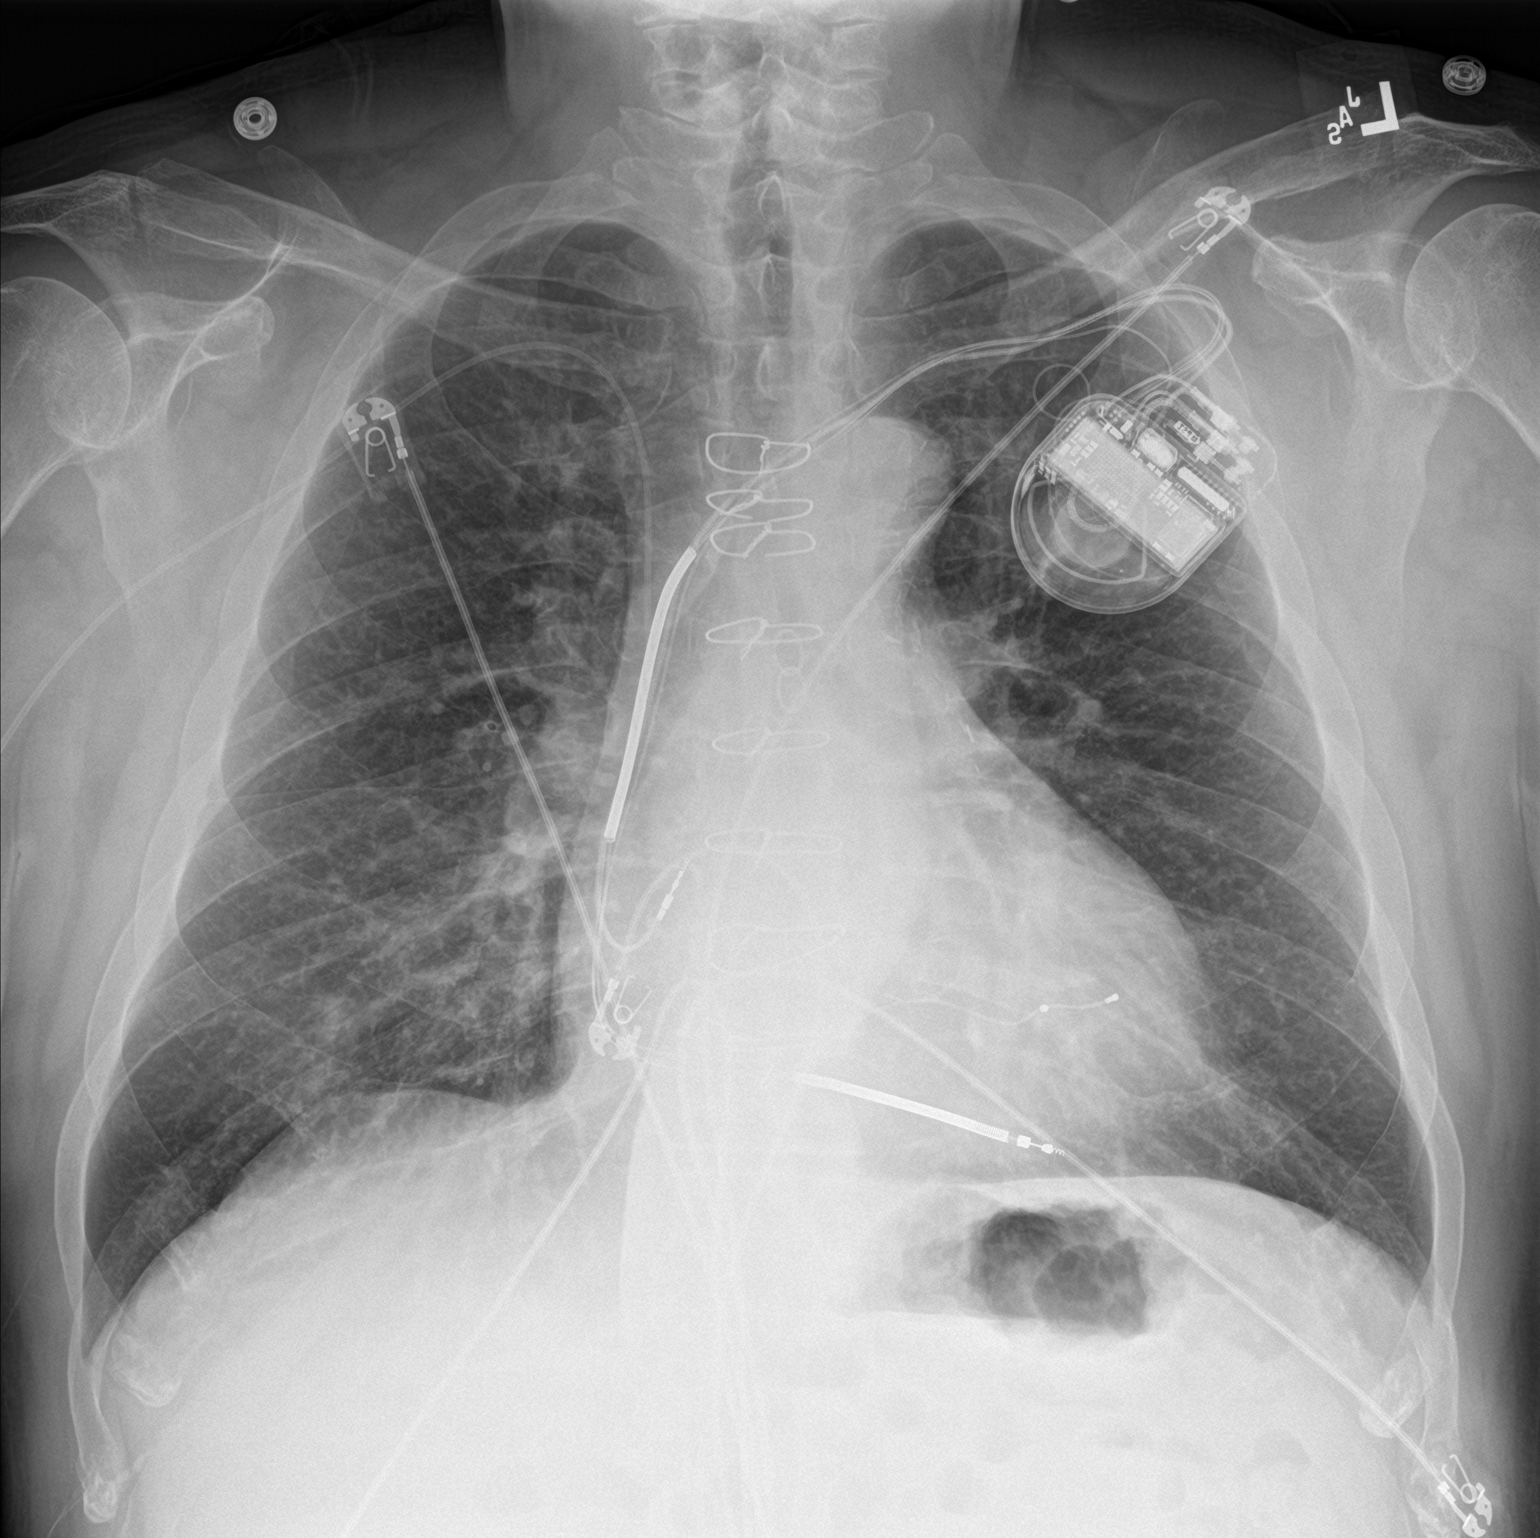

[chest lat]
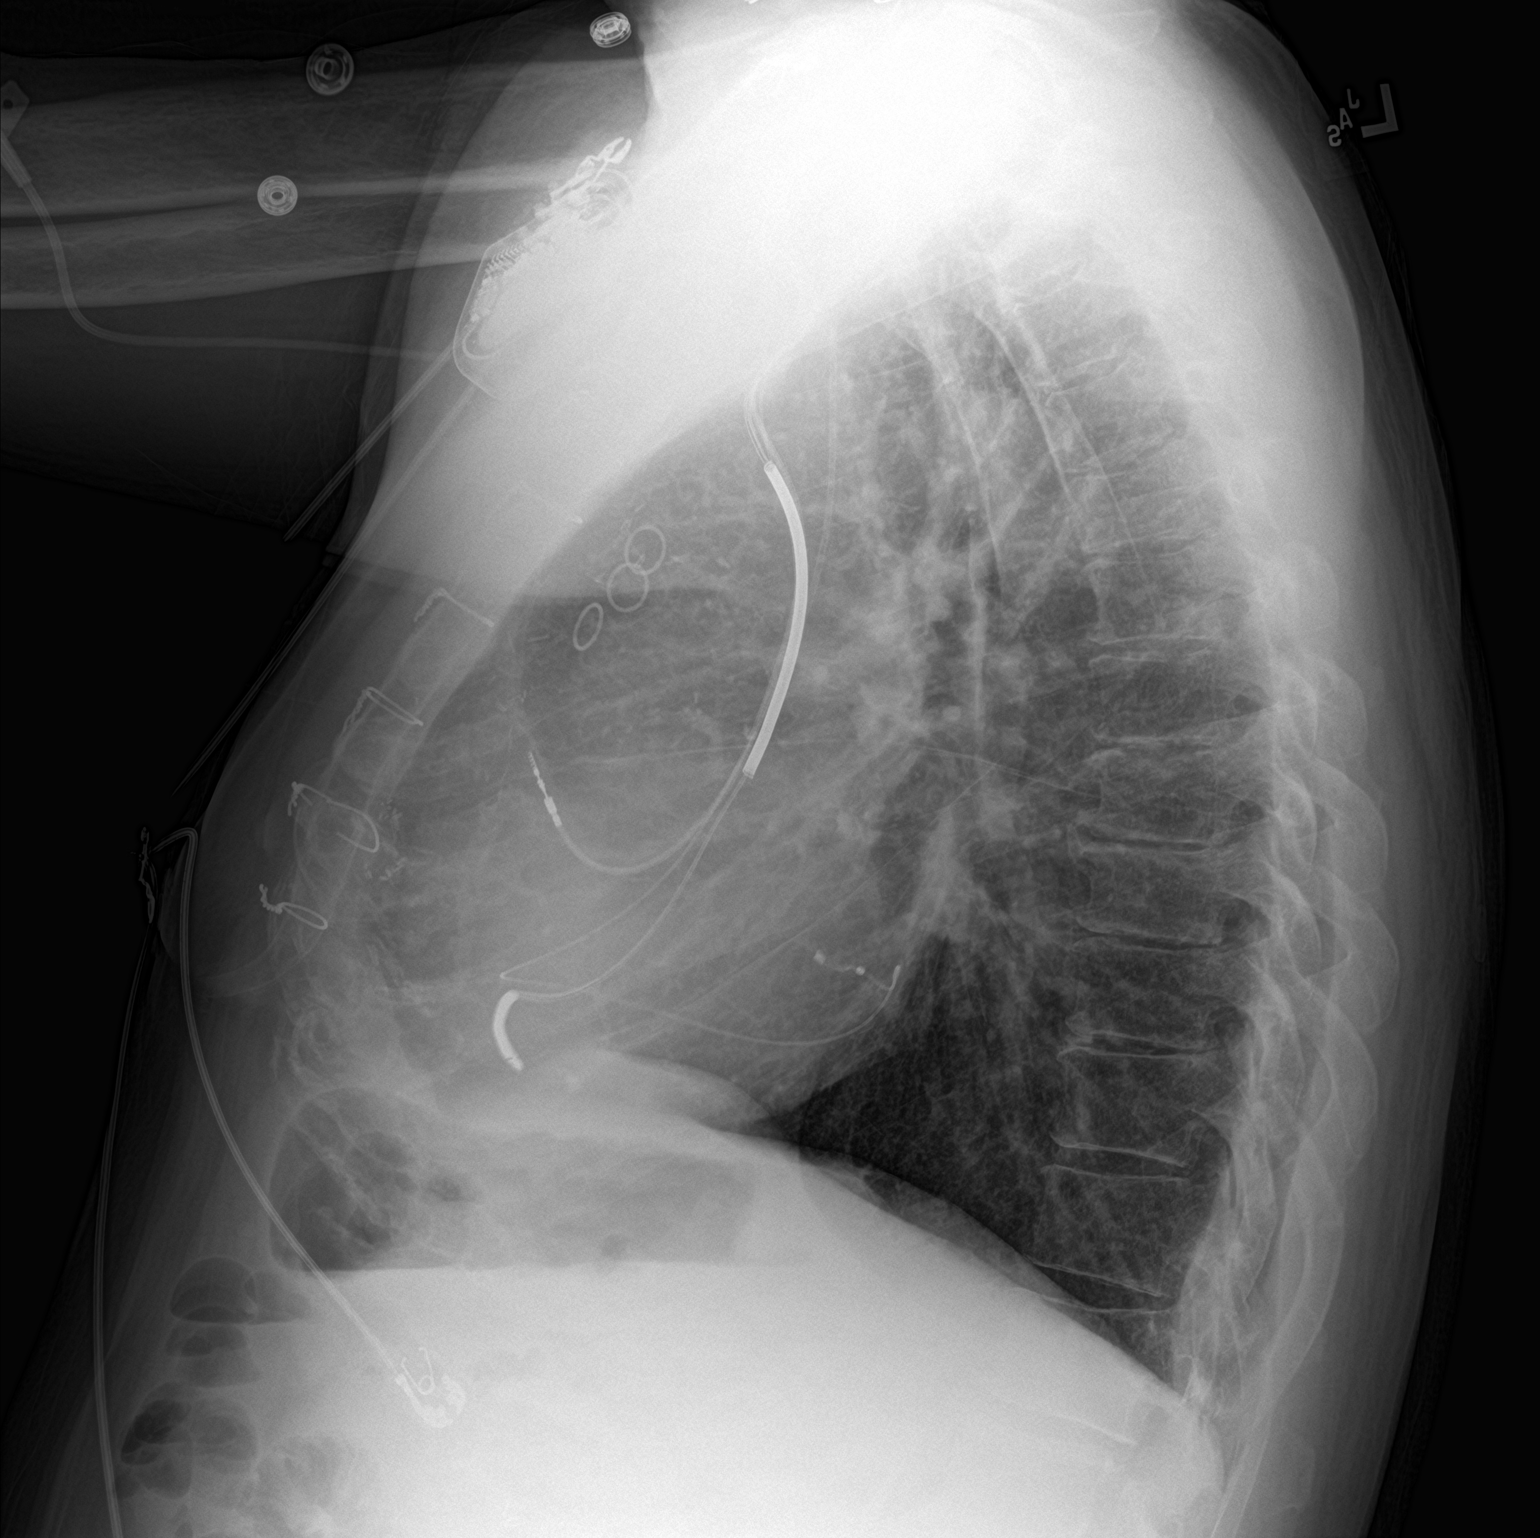

[2 of 2 positions shown; findings below may reference images not displayed]

FINDINGS: Stable AICD device. Stable fractured sternotomy wires. Stable right
PICC line and cardiomegaly. Mild vascular crowding in the medial
right lung base with no opacity in this region on the CT scan
performed at the same time. No other interval changes or acute
abnormalities.
IMPRESSION: No significant interval change or acute abnormality.

## 2018-01-23 IMAGING — CT CT CHEST W/O CM
2 of 3 series · 12 of 36 positions shown, 15 images · non-contrast
Comparison: Multiple recent chest x-rays and CT scan October 29, 2015

CLINICAL DATA: CHF.  Defibrillator placement.

EXAM:
CT CHEST WITHOUT CONTRAST
TECHNIQUE: Multidetector CT imaging of the chest was performed following the
standard protocol without IV contrast.

[Series 201: chest without, idose (2) · axial · non-contrast · 0.79mm/px · z∈[-9,+271]mm · 9 of 132 slices shown, 12 images]
[im 10/132  mediastinal]
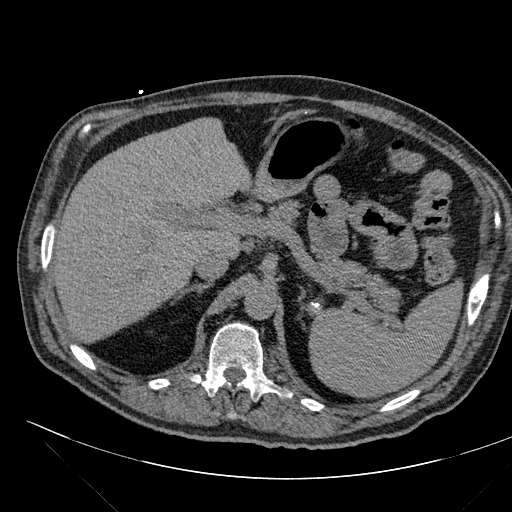
[im 10/132  lung]
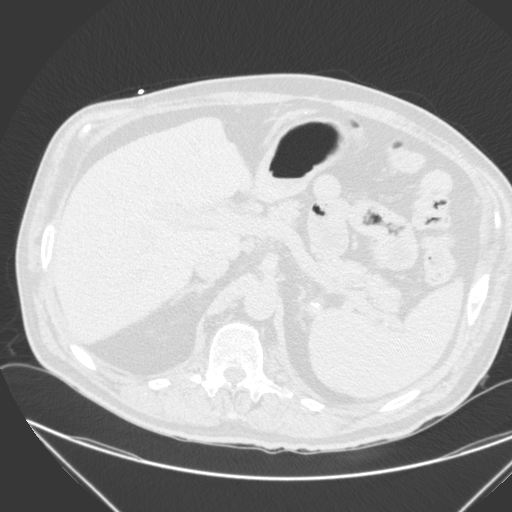
[im 25/132  lung]
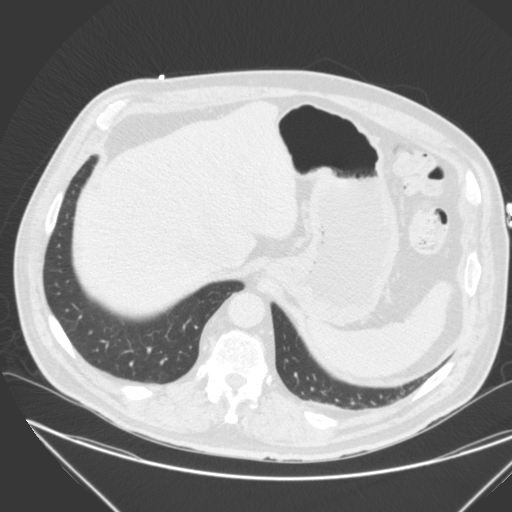
[im 39/132  lung]
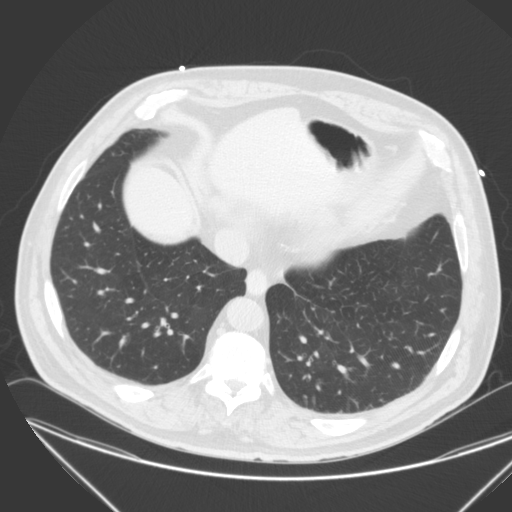
[im 54/132  lung]
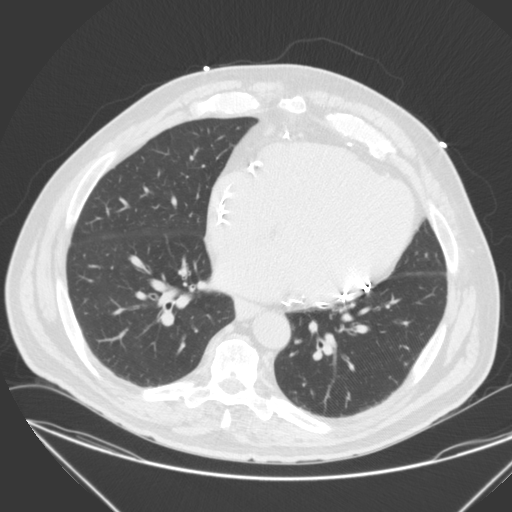
[im 68/132  mediastinal]
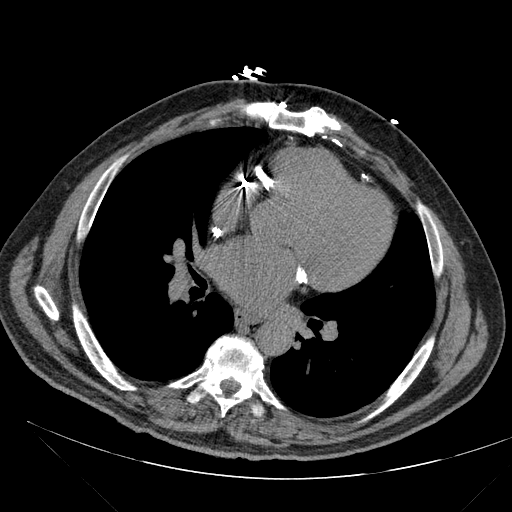
[im 68/132  lung]
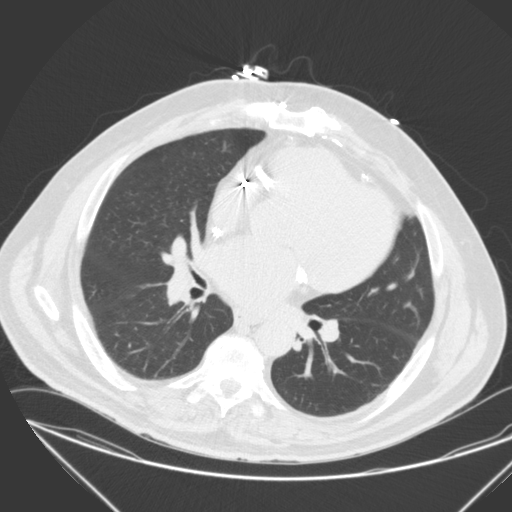
[im 78/132  lung]
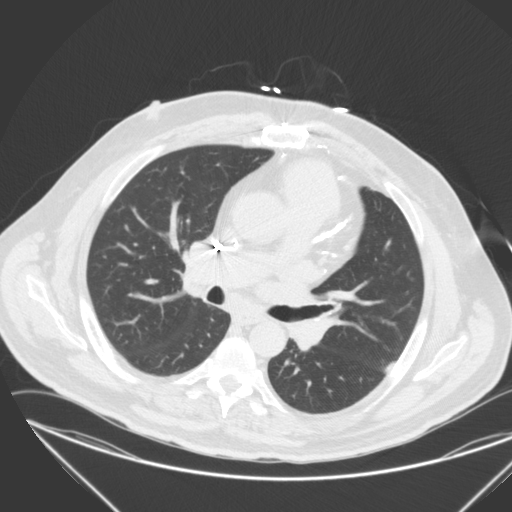
[im 93/132  lung]
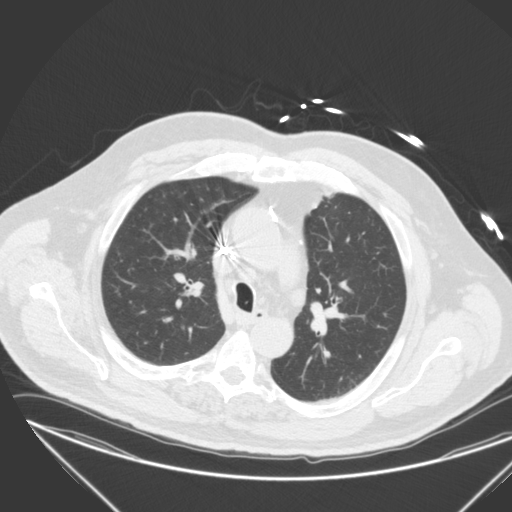
[im 107/132  lung]
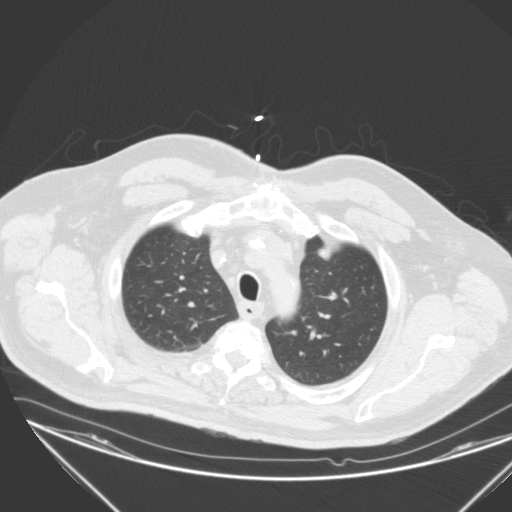
[im 122/132  mediastinal]
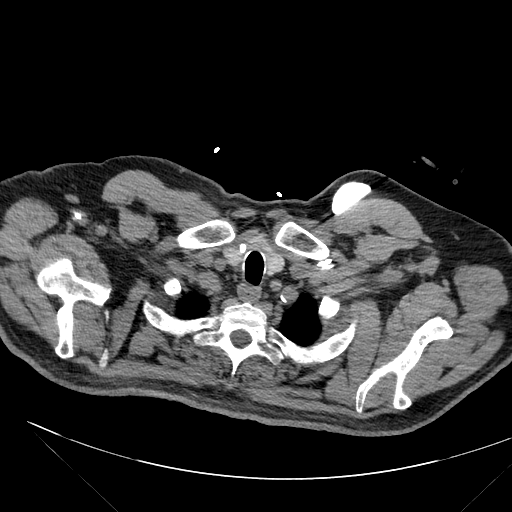
[im 122/132  lung]
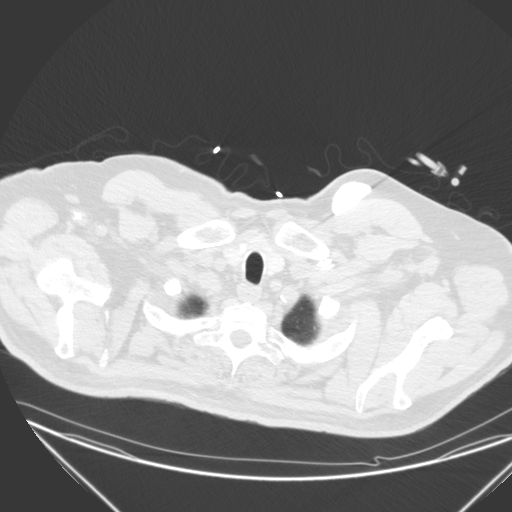

[Series 206: coronal, idose (2) · coronal · 0.45mm/px · 3 of 119 slices shown]
[im 24/119  lung]
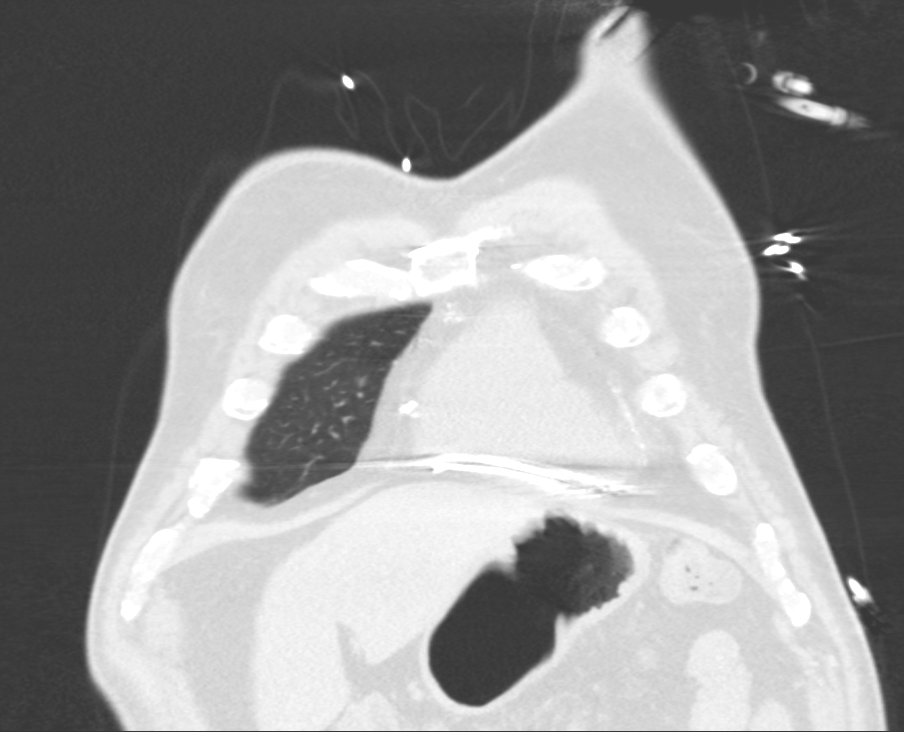
[im 48/119  lung]
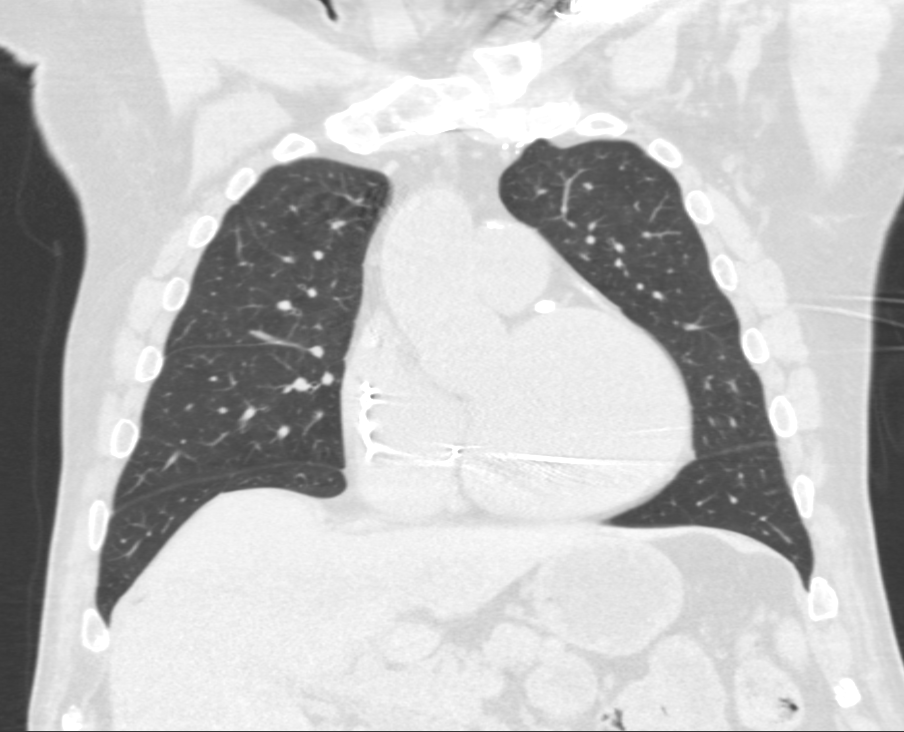
[im 71/119  lung]
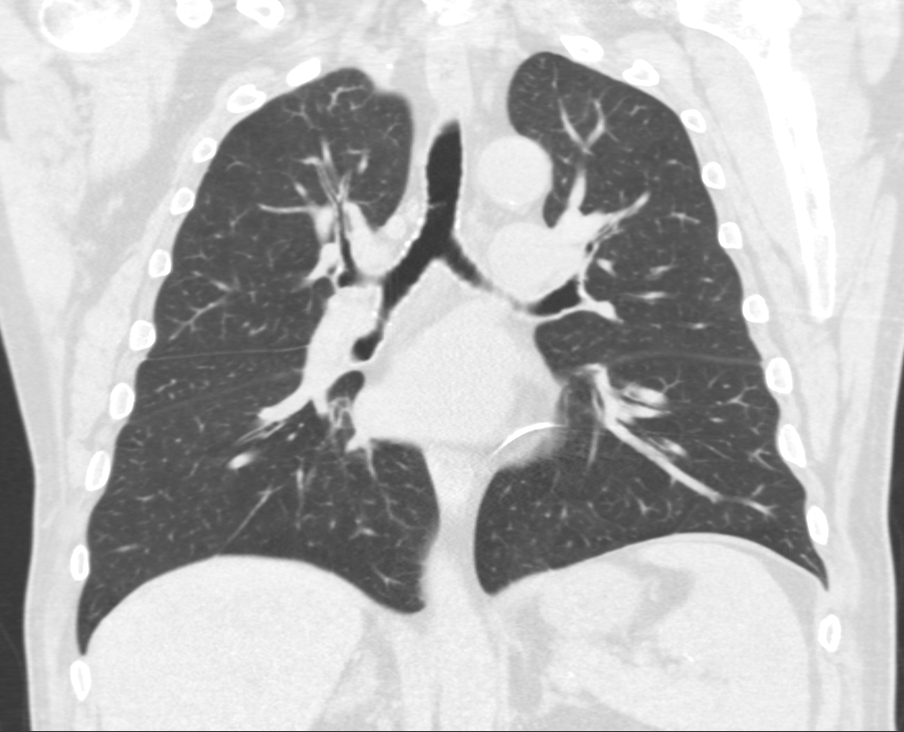

[12 of 36 positions shown; findings below may reference images not displayed]

FINDINGS: Cardiovascular: The heart is stable. Coronary artery calcifications
identified. The 4 lead pacemaker is in stable position. The central
great vessels are stable. The ascending thoracic aorta measures
cm which is mildly prominent but unchanged. The central pulmonary
arteries are stable and unremarkable.

Mediastinum/Nodes: Shotty nodes in the mediastinum are stable,
likely reactive. No other adenopathy in the chest. No effusions. The
esophagus is unremarkable.

Lungs/Pleura: Probable mucous posteriorly in the trachea. Central
airways are otherwise normal. No pneumothorax. No suspicious
pulmonary nodules, masses, or focal infiltrates.

Upper Abdomen: Myelo lipoma in the right adrenal gland. Previous
cholecystectomy. No other abnormalities on limited views the upper
abdomen.

Musculoskeletal: Fractured sternotomy wires again identified. No
other bony changes.
IMPRESSION: 1. No acute interval change. Pacemaker wires are in stable position.
Shotty reactive nodes in the mediastinum are stable.

## 2018-01-23 MED ORDER — WARFARIN SODIUM 5 MG PO TABS
5.0000 mg | ORAL_TABLET | Freq: Once | ORAL | Status: AC
  Administered 2018-01-23: 5 mg via ORAL
  Filled 2018-01-23: qty 1

## 2018-01-23 MED ORDER — POTASSIUM CHLORIDE CRYS ER 20 MEQ PO TBCR
40.0000 meq | EXTENDED_RELEASE_TABLET | Freq: Two times a day (BID) | ORAL | Status: DC
  Administered 2018-01-23 – 2018-01-26 (×8): 40 meq via ORAL
  Filled 2018-01-23 (×7): qty 2

## 2018-01-23 MED ORDER — TORSEMIDE 20 MG PO TABS
100.0000 mg | ORAL_TABLET | Freq: Two times a day (BID) | ORAL | Status: DC
  Administered 2018-01-24 – 2018-01-27 (×8): 100 mg via ORAL
  Filled 2018-01-23 (×8): qty 5

## 2018-01-23 NOTE — Clinical Social Work Note (Signed)
Clinical Social Work Assessment  Patient Details  Name: Ronald Miller. MRN: [id number] Date of Birth: April 29, 1956  Date of referral:  01/23/18               Reason for consult:  Facility Placement, Discharge Planning                Permission sought to share information with:  Chartered certified accountant granted to share information::  No  Name::        Agency::     Relationship::     Contact Information:     Housing/Transportation Living arrangements for the past 2 months:  Single Family Home Source of Information:  Patient Patient Interpreter Needed:  None Criminal Activity/Legal Involvement Pertinent to Current Situation/Hospitalization:  No - Comment as needed Significant Relationships:  Neighbor, Siblings Lives with:  Self Do you feel safe going back to the place where you live?  Yes Need for family participation in patient care:  No (Coment)  Care giving concerns: Patient from home independently. PT recommending home health/24 hour supervision. MD noting patient may need SNF.   Social Worker assessment / plan: CSW discussed case with CM and noted MD's indication patient may require SNF, specifically at [facility]. At this time, PT's recommendation is for home health with 24 hour supervision. CSW requested RN to support in having PT re-consult.  CSW met with patient at bedside to assess patient's support at home. Patient documented as oriented x4. CSW explained recommendation for 24 hour supervision and for MD mention of SNF. Patient refusing SNF. Patient indicated his neighbor checks on him. Patient is open to home health if his insurance will cover it.   Patient has not been seen by PT since 01/20/18. If medical team continues to recommend SNF, patient will need updated PT visit and note in order to initiate [facility] authorization. Patient would need [facility] before admitting to facility. Patient will also need to consent to SNF, as at this time,  patient is declining. CSW to follow to support with disposition planning.  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  Patient/Family's Understanding of and Emotional Response to Diagnosis, Current Treatment, and Prognosis: Patient did not clearly articulate understanding of conditions and treatment. Patient is declining SNF.   Emotional Assessment Appearance:  Appears stated age Attitude/Demeanor/Rapport:  Engaged, Lethargic Affect (typically observed):  Calm Orientation:  Oriented to Self, Oriented to Place, Oriented to  Time, Oriented to Situation Alcohol / Substance use:  Not Applicable Psych involvement (Current and /or in the community):  No (Comment)  Discharge Needs  Concerns to be addressed:  Discharge Planning Concerns, Care Coordination Readmission within the last 30 days:  No Current discharge risk:  Physical Impairment, Lives alone Barriers to Discharge:  Continued Medical Work up, [place], LCSW 01/23/2018, 2:50 PM

## 2018-01-23 NOTE — Progress Notes (Signed)
[place] for Warfarin Indication: LVAD  Allergies  Allergen Reactions  . Chlorhexidine Gluconate Itching and Rash  . Codeine Nausea And Vomiting and Other (See Comments)    In high doses  . Lipitor [Atorvastatin] Nausea Only and Other (See Comments)    Nausea with high doses, tolerates 20mg  dose (08/22/16)  . Tape Other (See Comments)    Paper tape is ok   Patient Measurements: Height: 5\' 9"  (175.3 cm) Weight: 187 lb 2.7 oz (84.9 kg) IBW/kg (Calculated) : 70.7 Height: 5'9"  Vital Signs: Temp: 97.8 F (36.6 C) (04/22 1134) Temp Source: Oral (04/22 1134) BP: 76/49 (04/22 1134) Pulse Rate: 66 (04/22 1134)  Labs: Recent Labs    01/21/18 0500 01/22/18 0428 01/23/18 0555  HGB 10.5* 11.1* 10.4*  HCT 33.3* 35.4* 33.0*  PLT 195 207 187  LABPROT 21.8* 22.1* 22.1*  INR 1.92 1.96 1.95  CREATININE 1.41* 1.76* 1.47*   Estimated Creatinine Clearance: 57 mL/min (A) (by C-G formula based on SCr of 1.47 mg/dL (H)).  Assessment: 89 yoM on warfarin PTA for LVAD.  INR is therapeutic at 1.95 today (1.96 yesterday). Hgb is 10.4, plts 187, LDH 124. No signs/symptoms of bleeding. Remains on home amiodarone.  PTA regimen: 7.5mg  on all days except 5mg  on Mondays and Fridays (admit INR 2.2)  Goal of Therapy:  INR 1.8-2.4 Monitor platelets by anticoagulation protocol: Yes   Plan:  Warfarin 5 mg PO x1 tonight Daily INR and CBC Monitor for s/sx of bleeding  Doylene Canard, PharmD Clinical Pharmacist  Pager: [phone] Clinical Phone for 01/23/2018 until 3:30pm: [phone] If after 3:30pm, please call main pharmacy at [phone] 01/23/2018

## 2018-01-23 NOTE — Progress Notes (Addendum)
Patient ID: Ronald Miller., male   DOB: [birth year], 62 y.o.   MRN: [id number]   Advanced Heart Failure VAD Team Note  PCP-Cardiologist: Glori Bickers, MD   Subjective:    Admitted with massive volume overload and disconnected dobutamine line. PICC line appeared cut.   Paracentesis 01/11/18 with 4.8 L off.  Paracentesis 01/16/18 with 2.4 liters off.   Unwitnessed fall on 4/16. CT of head negative.   Yesterday lactulose was increased. Also received 500 cc NS bolus. Todays CVP 9-10.  Denies SOB. Complaining of leg fatigue. Very weak. Wants to go home.   LVAD INTERROGATION:  HeartMate II LVAD:  Flow 5.6  liters/min, speed 5800, power 5  PI 2.2   Objective:    Vital Signs:   Temp:  [97.5 F (36.4 C)-97.9 F (36.6 C)] 97.5 F (36.4 C) (04/22 0721) Pulse Rate:  [59-72] 60 (04/22 0721) Resp:  [15-26] 17 (04/22 0721) BP: (80-113)/(23-95) 83/62 (04/22 0721) SpO2:  [94 %-100 %] 96 % (04/22 0721) Weight:  [187 lb 2.7 oz (84.9 kg)] 187 lb 2.7 oz (84.9 kg) (04/22 0344) Last BM Date: 01/22/18 Mean arterial Pressure 70s  Intake/Output:   Intake/Output Summary (Last 24 hours) at 01/23/2018 0805 Last data filed at 01/23/2018 0400 Gross per 24 hour  Intake 1240 ml  Output 1950 ml  Net -710 ml     Physical Exam  CVP 12 Physical Exam: GENERAL: NAD. In bed.  HEENT: normal anicteric NECK: Supple, JVP to jaw .  2+ bilaterally, no bruits.  No lymphadenopathy or thyromegaly appreciated.   CARDIAC:  Mechanical heart sounds with LVAD hum present.  R upper chest tunneled catheter.  LUNGS:  Clear to auscultation bilaterally.  ABDOMEN:  Soft, round, nontender, mildly distended  positive bowel sounds x4.     LVAD exit site: well-healed and incorporated.  Dressing dry and intact.  No erythema or drainage.  Stabilization device present and accurately applied.  Driveline dressing is being changed daily per sterile technique. EXTREMITIES:  Warm and dry, no cyanosis, clubbing, rash. Trace edema    NEUROLOGIC:  Alert and oriented x 4.  Gait steady.  No aphasia.  No dysarthria.  Affect pleasant.       Telemetry  A sensed V paced 60-70s   EKG    No new tracings.    Labs   Basic Metabolic Panel: Recent Labs  Lab 01/17/18 0500  01/19/18 0400 01/20/18 0207 01/21/18 0500 01/22/18 0428 01/23/18 0555  NA  --    < > 128* 127* 125* 125* 122*  K  --    < > 3.6 3.0* 3.3* 3.8 3.0*  CL  --    < > 84* 81* 79* 78* 78*  CO2  --    < > 33* 33* 33* 33* 30  GLUCOSE  --    < > 148* 165* 149* 157* 154*  BUN  --    < > 35* 34* 44* 51* 54*  CREATININE  --    < > 1.29* 1.27* 1.41* 1.76* 1.47*  CALCIUM  --    < > 8.5* 8.7* 8.8* 9.2 8.9  MG 2.1  --   --   --   --   --   --    < > = values in this interval not displayed.    Liver Function Tests: No results for input(s): AST, ALT, ALKPHOS, BILITOT, PROT, ALBUMIN in the last 168 hours. No results for input(s): LIPASE, AMYLASE in the last 168 hours. Recent  Labs  Lab 01/17/18 0353 01/21/18 0500 01/22/18 0427  AMMONIA 50* 63* 67*    CBC: Recent Labs  Lab 01/19/18 0400 01/20/18 0207 01/21/18 0500 01/22/18 0428 01/23/18 0555  WBC 6.9 7.0 7.3 6.4 6.7  HGB 10.3* 10.5* 10.5* 11.1* 10.4*  HCT 33.9* 33.9* 33.3* 35.4* 33.0*  MCV 79.4 78.3 77.4* 76.8* 76.7*  PLT 201 188 195 207 187    INR: Recent Labs  Lab 01/19/18 0400 01/20/18 0207 01/21/18 0500 01/22/18 0428 01/23/18 0555  INR 1.82 2.01 1.92 1.96 1.95    Other results:    Imaging   No results found.   Medications:     Scheduled Medications: . amiodarone  200 mg Oral Daily  . citalopram  20 mg Oral Daily  . digoxin  0.125 mg Oral Daily  . docusate sodium  200 mg Oral QHS  . gabapentin  300 mg Oral BID  . insulin aspart  5 Units Subcutaneous TID WC  . insulin glargine  10 Units Subcutaneous Q2200  . lactulose  45 g Oral TID  . magnesium oxide  400 mg Oral Daily  . midodrine  10 mg Oral TID WC  . multivitamin with minerals  1 tablet Oral Daily  .  pantoprazole  40 mg Oral BID  . spironolactone  25 mg Oral Daily  . sucralfate  1 g Oral TID WC & HS  . Warfarin - Pharmacist Dosing Inpatient   Does not apply q1800    Infusions: . DOBUTamine 7.5 mcg/kg/min (01/23/18 0559)    PRN Medications: acetaminophen, cyclobenzaprine, ondansetron (ZOFRAN) IV, ondansetron, senna-docusate, sodium chloride flush, traZODone   Patient Profile    Ronald Miller. is a 62 y.o. male  with history of chronic systolic HF s/p Medtronic ICD 2014, CAD s/p CABG in 2000, HTN, Hx of cocaine abuse, Tobacco abuse, Depression, PAF, and COPD. HM3 placed 09/09/2016.   Admitted from clinic with encephalopathy and acute on chronic CHF. Dobutamine disconnected sometime overnight.    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 Has Medtronic ICD. Admitted with volume overload and acute encephalopathy. He diuresed well again yesterday but .  LVAD parameters stable.  LDH stable.  Weight down over 30 pounds.  - CVP 9-10 . - - Renal function coming back down.  - Creatinine 1.27->1.41->1.7->1.47 - Continue dobutamine 7.5 mcg/kg/min (on chronically for RV failure).  - Continue midodrine 10 mg tid. - Not a good heart transplant candidate. Has reached end stage biventricular failure.  - VAD interrogated personally. Parameters stable.  - Suspect he will need SNF ([facility]) on d/c 2. CAD: Stable. No chest pain.    3. DMII: SSI.  4. PAF: Maintaining NSR. On amiodarone.    5. Anemia, iron deficiency: Had capsule endoscopy 04/2017 with severe gastritis. Continue carafate.  -hg improved 10.5->11.1->10.4  6. Anxiety/Depression: Continue celexa. Stable.   7. H/O RUE DVT: On coumadin.   8. Chronic Anticoagulation: On coumadin, INR goal 1.8 - 2.4.  - INR 1.95.  -Stable.  - Discussed dosing with PharmD personally. 9. Cardiac cirrhosis with ascites: Had Paracentesis on 4/10 with 4.8 L out.  S/P Paracentesis 4/15with 2.4 liters  removed.   10. Confusion/Hepatic Encephalopathy: NH3 has been high, we have increased lactulose.  - ammonia 80 on 4/12.  - Ammonia down to 50 on 4/16  - Ammonia 67 on 4/21 - Continue higher dose of lactulose-->45 tid 11. Hyponatremia: sodium down to 122. Continue to restrict free water  12. Fall- unwitnessed  4/16. CT of head negative.  13. Hypokalemia- K 3.00 Supp K.   PT to evaluate today. ? SNF Anticipate d/c am.   I reviewed the LVAD parameters from today, and compared the results to the patient's prior recorded data.  No programming changes were made.  The LVAD is functioning within specified parameters.  The patient performs LVAD self-test daily.  LVAD interrogation was negative for any significant power changes, alarms or PI events/speed drops.  LVAD equipment check completed and is in good working order.  Back-up equipment present.   LVAD education done on emergency procedures and precautions and reviewed exit site care.   Length of Stay: [place], NP 01/23/2018, 8:05 AM  VAD Team --- VAD ISSUES ONLY--- Pager [phone] (7am - 7am)  Advanced Heart Failure Team  Pager [phone] (M-F; 7a - 4p)  Please contact [facility] for night-coverage after hours (4p -7a ) and weekends on [email or web address]  Patient seen and examined with Darrick Grinder, NP. We discussed all aspects of the encounter. I agree with the assessment and plan as stated above.   Volume status better but CVP still mildly elevated. Diuretics held yesterday due to AKI. Will restart today. Remains very weak. Confused at times. BP stable on dobutamine and midodrine. Will need SNF placement. Long discussion with him regarding need for SNF and inability for him to care for himself.   Glori Bickers, MD  9:05 PM

## 2018-01-23 NOTE — Progress Notes (Signed)
LVAD Coordinator Rounding Note:  Admitted 01/11/18 to Dr. Haroldine Laws due to acute encephalopathy and CHF with fluid overload. Home Dobutamine gtt disconnected.     HM III LVAD implanted on 09/09/16 by Dr. Cyndia Bent under Destination Therapy criteria.   Vital signs: Temp:  97.8 HR: 66 Doppler Pressure: 70 Automatic BP:  83/62 (71) O2 Sat: 94% on RA Wt: 220>222>213>208>212>216>198>187>185>183>187 lbs  LVAD interrogation reveals:  Speed: 5800 Flow: 5.7 Power: 4.6 w PI:  2.2 Alarms: none Events: 36 PI events Hematocrit:  33 Fixed speed:  5800 Low speed limit: 5500  Drive Line: left abdominal sorbaview dressing dry and intact; anchor intact and accurately applied. Change weekly; due [facility] April 25. Driveline anchored and secured.  Labs:  LDH trend: 159>136>140>141>132>132>137>139>136>127>124  INR trend: 2.2>2.21>2.82>3.04>1.83>1.91>1.97>1.82>2.01>1.92>1.96>1.95  Gtts: Dobutamine 7.5 mcg/kg/min  Anticoagulation Plan: -INR Goal:  1.8 - 2.3 -ASA Dose: no ASA (history of GI bleeding)  Device: -Protect; end of service 08/2016 -Therapies:  Off Last check: complete interrogation 05/26/2017. DDD (lower rate 60); BiV pacing with 96% AS-VP  Adverse Events on VAD: 11/30/16> Milrinone gtt at discharge 01/27/17>dobutamine at 5 mcg/kg/mingtt at discharge 03/08/17> RV failure 04/29/17> symptomatic anemia and melena. EGD revealed stomach ulcers. Carafate added; ASA stopped 05/31/17>Ongoing symptomatic anemia and melena; capsule study mild antral gastritis; poss tiny AVM right colon. Lower MAPS, Losartan cut back. 06/2017> admit with syncope, AMS, Dobutamine at 7.56mcg 11/09/17>Marked ascites, RV failure, and hepatic encephalopathy. Paracentesis on 2/7 and 2/12 with >11 liters fluid removed 12/27/17>Outpatient paracentesis with 2.8 liters removed 01/11/18>IP paracentesis with 4.8 liters removed   Plan/Recommendations:  1. Will need weekly dressing changes; due [facility] April 25. Bedside RN may change  dressing. 2. Please call VAD pager with any VAD equipment or drive line issues.  Zada Girt RN, VAD Coordiantor 24/7 VAD pager: [phone]

## 2018-01-24 ENCOUNTER — Other Ambulatory Visit (HOSPITAL_COMMUNITY): Payer: Self-pay | Admitting: Internal Medicine

## 2018-01-24 LAB — GLUCOSE, CAPILLARY
GLUCOSE-CAPILLARY: 229 mg/dL — AB (ref 65–99)
Glucose-Capillary: 202 mg/dL — ABNORMAL HIGH (ref 65–99)
Glucose-Capillary: 214 mg/dL — ABNORMAL HIGH (ref 65–99)
Glucose-Capillary: 205 mg/dL — ABNORMAL HIGH (ref 65–99)

## 2018-01-24 LAB — PROTIME-INR
INR: 1.96
Prothrombin Time: 22.2 seconds — ABNORMAL HIGH (ref 11.4–15.2)

## 2018-01-24 LAB — BASIC METABOLIC PANEL
ANION GAP: 10 (ref 5–15)
BUN: 47 mg/dL — AB (ref 6–20)
CO2: 30 mmol/L (ref 22–32)
Calcium: 8.5 mg/dL — ABNORMAL LOW (ref 8.9–10.3)
Chloride: 80 mmol/L — ABNORMAL LOW (ref 101–111)
Creatinine, Ser: 1.29 mg/dL — ABNORMAL HIGH (ref 0.61–1.24)
GFR calc Af Amer: >60 mL/min (ref >60)
GFR calc non Af Amer: 58 mL/min — ABNORMAL LOW (ref >60)
GLUCOSE: 183 mg/dL — AB (ref 65–99)
POTASSIUM: 3.9 mmol/L (ref 3.5–5.1)
Sodium: 120 mmol/L — ABNORMAL LOW (ref 135–145)

## 2018-01-24 LAB — CBC
HEMATOCRIT: 31.5 % — AB (ref 39.0–52.0)
Hemoglobin: 9.9 g/dL — ABNORMAL LOW (ref 13.0–17.0)
MCH: 24.3 pg — ABNORMAL LOW (ref 26.0–34.0)
MCHC: 31.4 g/dL (ref 30.0–36.0)
MCV: 77.2 fL — AB (ref 78.0–100.0)
Platelets: 175 10*3/uL (ref 150–400)
RBC: 4.08 MIL/uL — AB (ref 4.22–5.81)
RDW: 18.8 % — AB (ref 11.5–15.5)
WBC: 8.1 10*3/uL (ref 4.0–10.5)

## 2018-01-24 LAB — LACTATE DEHYDROGENASE: LDH: 158 U/L (ref 98–192)

## 2018-01-24 MED ORDER — DEMECLOCYCLINE HCL 150 MG PO TABS
150.0000 mg | ORAL_TABLET | Freq: Two times a day (BID) | ORAL | Status: DC
Start: 2018-01-24 — End: 2018-01-26
  Administered 2018-01-24 – 2018-01-25 (×3): 150 mg via ORAL
  Filled 2018-01-24 (×4): qty 1

## 2018-01-24 MED ORDER — WARFARIN SODIUM 7.5 MG PO TABS
7.5000 mg | ORAL_TABLET | Freq: Once | ORAL | Status: AC
  Administered 2018-01-24: 7.5 mg via ORAL
  Filled 2018-01-24: qty 1

## 2018-01-24 NOTE — Care Management Important Message (Signed)
Important Message  Patient Details  Name: Ronald Miller. MRN: [id number] Date of Birth: [birth year]   Medicare Important Message Given:  Yes    Barb Merino Camella Seim 01/24/2018, 1:56 PM

## 2018-01-24 NOTE — Progress Notes (Addendum)
Patient ID: Ronald Miller., male   DOB: 02-Sep-1956, 62 y.o.   MRN: [id number]   Advanced Heart Failure VAD Team Note  PCP-Cardiologist: Glori Bickers, MD   Subjective:    Admitted with massive volume overload and disconnected dobutamine line. PICC line appeared cut.   Paracentesis 01/11/18 with 4.8 L off.  Paracentesis 01/16/18 with 2.4 liters off.   Unwitnessed fall on 4/16. CT of head negative.   Remains fatigued. Denies SOB. He is hesitant to go to SNF, but willing to try. Less confused. No orthopnea or PND.   LVAD INTERROGATION:  HeartMate II LVAD:  Flow 5.5 liters/min, speed 5800, power 5.0 PI 2.5.  Objective:    Vital Signs:   Temp:  [97.4 F (36.3 C)-98.1 F (36.7 C)] 98 F (36.7 C) (04/23 0708) Pulse Rate:  [25-76] 76 (04/23 0905) Resp:  [17-20] 17 (04/23 0708) BP: (65-97)/(34-86) 97/86 (04/23 0905) SpO2:  [92 %-98 %] 93 % (04/23 0708) Weight:  [172 lb 6.4 oz (78.2 kg)] 172 lb 6.4 oz (78.2 kg) (04/23 0601) Last BM Date: 01/23/18 Mean arterial Pressure 70s  Intake/Output:   Intake/Output Summary (Last 24 hours) at 01/24/2018 1121 Last data filed at 01/24/2018 0954 Gross per 24 hour  Intake 1400.3 ml  Output 800 ml  Net 600.3 ml     Physical Exam  CVP 12-14 cm  Physical Exam: GENERAL: sitting on side of bedcNAD. Fatigued and disheveled  HEENT: Normal. Anicteric  NECK: Supple, JVP to jaw. Carotids OK.  CARDIAC:  Mechanical heart sounds with LVAD hum present.  LUNGS:  CTAB, normal effort. Dull at bases ABDOMEN: Soft, NT, ND, no HSM. No bruits or masses. +BS  LVAD exit site: Well-healed and incorporated. Dressing dry and intact. No erythema or drainage. Stabilization device present and accurately applied. Driveline dressing changed daily per sterile technique. EXTREMITIES:  Warm and dry. No cyanosis, clubbing, or rash. Trace edema.  NEUROLOGIC:  Alert & oriented x 3. Cranial nerves grossly intact. Moves all 4 extremities w/o difficulty. Affect pleasant   No  asterixis   Telemetry   A sensed V paced 60-70s, personally reviewed.   EKG    No new tracings.    Labs   Basic Metabolic Panel: Recent Labs  Lab 01/20/18 0207 01/21/18 0500 01/22/18 0428 01/23/18 0555 01/24/18 0435  NA 127* 125* 125* 122* 120*  K 3.0* 3.3* 3.8 3.0* 3.9  CL 81* 79* 78* 78* 80*  CO2 33* 33* 33* 30 30  GLUCOSE 165* 149* 157* 154* 183*  BUN 34* 44* 51* 54* 47*  CREATININE 1.27* 1.41* 1.76* 1.47* 1.29*  CALCIUM 8.7* 8.8* 9.2 8.9 8.5*    Liver Function Tests: No results for input(s): AST, ALT, ALKPHOS, BILITOT, PROT, ALBUMIN in the last 168 hours. No results for input(s): LIPASE, AMYLASE in the last 168 hours. Recent Labs  Lab 01/21/18 0500 01/22/18 0427  AMMONIA 63* 67*    CBC: Recent Labs  Lab 01/20/18 0207 01/21/18 0500 01/22/18 0428 01/23/18 0555 01/24/18 0435  WBC 7.0 7.3 6.4 6.7 8.1  HGB 10.5* 10.5* 11.1* 10.4* 9.9*  HCT 33.9* 33.3* 35.4* 33.0* 31.5*  MCV 78.3 77.4* 76.8* 76.7* 77.2*  PLT 188 195 207 187 175    INR: Recent Labs  Lab 01/20/18 0207 01/21/18 0500 01/22/18 0428 01/23/18 0555 01/24/18 0435  INR 2.01 1.92 1.96 1.95 1.96    Other results:    Imaging   No results found.   Medications:     Scheduled Medications: . amiodarone  200 mg Oral Daily  . citalopram  20 mg Oral Daily  . digoxin  0.125 mg Oral Daily  . docusate sodium  200 mg Oral QHS  . gabapentin  300 mg Oral BID  . insulin aspart  5 Units Subcutaneous TID WC  . insulin glargine  10 Units Subcutaneous Q2200  . lactulose  45 g Oral TID  . magnesium oxide  400 mg Oral Daily  . midodrine  10 mg Oral TID WC  . multivitamin with minerals  1 tablet Oral Daily  . pantoprazole  40 mg Oral BID  . potassium chloride  40 mEq Oral BID  . spironolactone  25 mg Oral Daily  . sucralfate  1 g Oral TID WC & HS  . torsemide  100 mg Oral BID  . Warfarin - Pharmacist Dosing Inpatient   Does not apply q1800    Infusions: . DOBUTamine 7.5 mcg/kg/min  (01/24/18 0543)    PRN Medications: acetaminophen, cyclobenzaprine, ondansetron (ZOFRAN) IV, ondansetron, senna-docusate, sodium chloride flush, traZODone   Patient Profile    Ronald Miller. is a 62 y.o. male  with history of chronic systolic HF s/p Medtronic ICD 2014, CAD s/p CABG in 2000, HTN, Hx of cocaine abuse, Tobacco abuse, Depression, PAF, and COPD. HM3 placed 09/09/2016.   Admitted from clinic with encephalopathy and acute on chronic CHF. Dobutamine disconnected sometime overnight.    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 Has Medtronic ICD. Admitted with volume overload and acute encephalopathy.  - VAD interrogated personally. Parameters stable.  - Down over 30 lbs in setting of diuresis and paracentesis x 2.  - CVP 12-14. Resuming torsemide 100 mg BID.  - Renal function stable to improve at 1.29.  - Continue dobutamine 7.5 mcg/kg/min (on chronically for RV failure).  - Continue midodrine 10 mg tid. - Not a good heart transplant candidate. Has reached end stage biventricular failure.  - Planning for SNF on d/c ([facility]) 2. CAD: Stable.  - No s/s of ischemia.    3. DMII:  - SSI 4. PAF:  - In NSR on po amio.  5. Anemia, iron deficiency: Had capsule endoscopy 04/2017 with severe gastritis. Continue carafate.  -hg improved 10.5->11.1->10.4 -> 9.9 6. Anxiety/Depression: Continue celexa. Stable.  7. H/O RUE DVT: On coumadin.   - No change to current plan.   8. Chronic Anticoagulation: On coumadin, INR goal 1.8 - 2.4.  - INR 1.96. Stable. Discussed dosing personally with Pharm-D.  9. Cardiac cirrhosis with ascites: Had Paracentesis on 4/10 with 4.8 L out.  S/P Paracentesis 4/15with 2.4 liters removed.   - Improved.  10. Confusion/Hepatic Encephalopathy: NH3 has been high, we have increased lactulose.  - Continue higher dose of lactulose-->45 tid with improvement of ammonia and symptoms.  11. Hyponatremia: - Na  120 today. Continue to restrict free water  12. Fall- unwitnessed 4/16. CT of head negative.  - - No change to current plan.   13. Hypokalemia- K 3.9. Follow.   To SNF once bed available/insurance approved.   I reviewed the LVAD parameters from today, and compared the results to the patient's prior recorded data.  No programming changes were made.  The LVAD is functioning within specified parameters.  The patient performs LVAD self-test daily.  LVAD interrogation was negative for any significant power changes, alarms or PI events/speed drops.  LVAD equipment check completed and is in good working order.  Back-up equipment present.   LVAD education done on  emergency procedures and precautions and reviewed exit site care.   Length of Stay: [address]  Annamaria Helling 01/24/2018, 11:21 AM  VAD Team --- VAD ISSUES ONLY--- Pager [phone] (7am - 7am)  Advanced Heart Failure Team  Pager [phone] (M-F; 7a - 4p)  Please contact [facility] for night-coverage after hours (4p -7a ) and weekends on [email or web address]  Patient seen and examined with the above-signed Advanced Practice Provider and/or Housestaff. I personally reviewed laboratory data, imaging studies and relevant notes. I independently examined the patient and formulated the important aspects of the plan. I have edited the note to reflect any of my changes or salient points. I have personally discussed the plan with the patient and/or family.  Overall remains quite tenuous but this is probably as good as we are going to get him. Will continue dobutamine for RV support. Continue lactulose for hepatic encephalopathy. Long discussion with him about the fact that he is not suitable or safe to go home. Will arrange SNF. VAD interrogated personally. Parameters stable. INR 1.96. Discussed dosing with PharmD personally. Serum sodium falling steadily. Limit free water. Start demclocycline.   Glori Bickers, MD  4:44 PM

## 2018-01-24 NOTE — Clinical Social Work Placement (Signed)
   CLINICAL SOCIAL WORK PLACEMENT  NOTE  Date:  01/24/2018  Patient Details  Name: Ronald Miller. MRN: [id number] Date of Birth: [birth year]  Clinical Social Work is seeking post-discharge placement for this patient at the [place] level of care (*CSW will initial, date and re-position this form in  chart as items are completed):  Yes   Patient/family provided with [place] Work Department's list of facilities offering this level of care within the geographic area requested by the patient (or if unable, by the patient's family).  Yes   Patient/family informed of their freedom to choose among providers that offer the needed level of care, that participate in Medicare, Medicaid or managed care program needed by the patient, have an available bed and are willing to accept the patient.  Yes   Patient/family informed of [facility]'s ownership interest in [facility] and [facility], as well as of the fact that they are under no obligation to receive care at these facilities.  PASRR submitted to EDS on 01/24/18     PASRR number received on       Existing PASRR number confirmed on 01/24/18     FL2 transmitted to all facilities in geographic area requested by pt/family on 01/24/18     FL2 transmitted to all facilities within larger geographic area on       Patient informed that his/her managed care company has contracts with or will negotiate with certain facilities, including the following:            Patient/family informed of bed offers received.  Patient chooses bed at       Physician recommends and patient chooses bed at      Patient to be transferred to   on  .  Patient to be transferred to facility by       Patient family notified on   of transfer.  Name of family member notified:        PHYSICIAN Please sign FL2     Additional Comment:    _______________________________________________ Candie Chroman, LCSW 01/24/2018,  11:36 AM
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
## 2018-01-24 NOTE — Clinical Social Work Note (Addendum)
CSW met with patient. He is now agreeable to SNF placement. CSW completed FL2 and sent referral to [facility] and [facility] as these are the only two local facilities that will take LVADs. RNCM will ask heart failure PA for OT order as insurance will require evaluation for authorization.  Dayton Scrape, [place]  12:28 pm Spoke with clinical liaison for [facility]. They will have to have staff re-trained on LVADs if patient really wants to come to their facility. They had been trained in the past but most of those nurses no longer work there. The Dobutamine also cannot be titrated while at SNF because that requires cardiac monitoring which no SNF has. [facility] does not have a contract with [employer] so they would have to get a one-time contract. CSW left message for [facility] admissions coordinator to let her know referral had been sent over.  Dayton Scrape, [place]

## 2018-01-24 NOTE — Progress Notes (Addendum)
[facility] for Warfarin Indication: LVAD  Allergies  Allergen Reactions  . Chlorhexidine Gluconate Itching and Rash  . Codeine Nausea And Vomiting and Other (See Comments)    In high doses  . Lipitor [Atorvastatin] Nausea Only and Other (See Comments)    Nausea with high doses, tolerates 20mg  dose (08/22/16)  . Tape Other (See Comments)    Paper tape is ok   Patient Measurements: Height: 5\' 9"  (175.3 cm) Weight: 172 lb 6.4 oz (78.2 kg) IBW/kg (Calculated) : 70.7 Height: 5'9"  Vital Signs: Temp: 98 F (36.7 C) (04/23 0708) Temp Source: Oral (04/23 0708) BP: 97/86 (04/23 0905) Pulse Rate: 76 (04/23 0905)  Labs: Recent Labs    01/22/18 0428 01/23/18 0555 01/24/18 0435  HGB 11.1* 10.4* 9.9*  HCT 35.4* 33.0* 31.5*  PLT 207 187 175  LABPROT 22.1* 22.1* 22.2*  INR 1.96 1.95 1.96  CREATININE 1.76* 1.47* 1.29*   Estimated Creatinine Clearance: 60.1 mL/min (A) (by C-G formula based on SCr of 1.29 mg/dL (H)).  Assessment: 50 yoM on warfarin PTA for LVAD.  INR is therapeutic at 1.96 today. Hgb is 9.9, plts 175, LDH 158. No signs/symptoms of bleeding. Remains on home amiodarone.  PTA regimen: 7.5mg  on all days except 5mg  on Mondays and Fridays (admit INR 2.2)  Goal of Therapy:  INR 1.8-2.4 Monitor platelets by anticoagulation protocol: Yes   Plan:  Warfarin 7.5 mg PO x1 tonight >> can resume home regimen at discharge Daily INR and CBC Monitor for s/sx of bleeding  Doylene Canard, PharmD Clinical Pharmacist  Pager: [phone] Clinical Phone for 01/24/2018 until 3:30pm: [phone] If after 3:30pm, please call main pharmacy at [phone] 01/24/2018

## 2018-01-24 NOTE — Progress Notes (Signed)
LVAD Coordinator Rounding Note:  Admitted 01/11/18 to Dr. Haroldine Laws due to acute encephalopathy and CHF with fluid overload. Home Dobutamine gtt disconnected.     HM III LVAD implanted on 09/09/16 by Dr. Cyndia Bent under Destination Therapy criteria.  Pt awake, saying he has to go to Rehab per Dr. Haroldine Laws. PT in to work with pt - he did not want to get OOB, but asked him to walk at least once today. Pt was unable to change his power source from PM to batteries independently, confused about steps. Was able to follow verbal directions for one power cord change; frustrated with second power source change over and asked [place] to complete.   Vital signs: Temp:  98.0 HR: 67 Doppler Pressure:  70 Automatic BP:  65/58 O2 Sat: % on RA Wt: 220>222>213>208>212>216>198>187>185>183>187>172 lbs  LVAD interrogation reveals:  Speed: 5800 Flow: 5.5 Power: 4.5 w PI:  4.8 Alarms: none Events: 45 PI events Hematocrit:  33 Fixed speed:  5800 Low speed limit: 5500  Drive Line: left abdominal sorbaview dressing dry and intact; anchor broken, replaced. Change weekly; due [facility] April 25. Driveline anchored and secured.  Labs:  LDH trend: 159>136>140>141>132>132>137>139>136>127>124>158  INR trend: 2.2>2.21>2.82>3.04>1.83>1.91>1.97>1.82>2.01>1.92>1.96>1.95>1.96  Gtts: Dobutamine 7.5 mcg/kg/min  Anticoagulation Plan: -INR Goal:  1.8 - 2.3 -ASA Dose: no ASA (history of GI bleeding)  Device: -Protect; end of service 08/2016 -Therapies:  Off Last check: complete interrogation 05/26/2017. DDD (lower rate 60); BiV pacing with 96% AS-VP  Adverse Events on VAD: 11/30/16> Milrinone gtt at discharge 01/27/17>dobutamine at 5 mcg/kg/mingtt at discharge 03/08/17> RV failure 04/29/17> symptomatic anemia and melena. EGD revealed stomach ulcers. Carafate added; ASA stopped 05/31/17>Ongoing symptomatic anemia and melena; capsule study mild antral gastritis; poss tiny AVM right colon. Lower MAPS, Losartan cut  back. 06/2017> admit with syncope, AMS, Dobutamine at 7.70mcg 11/09/17>Marked ascites, RV failure, and hepatic encephalopathy. Paracentesis on 2/7 and 2/12 with >11 liters fluid removed 12/27/17>Outpatient paracentesis with 2.8 liters removed 01/11/18>IP paracentesis with 4.8 liters removed   Plan/Recommendations:  1. Will need weekly dressing changes; due [facility] April 25. Bedside RN may change dressing. 2. Please call VAD pager with any VAD equipment or drive line issues.  Zada Girt RN, VAD Coordiantor 24/7 VAD pager: [phone]

## 2018-01-24 NOTE — Plan of Care (Signed)
  Problem: Education: Goal: Knowledge of General Education information will improve Outcome: Progressing   Problem: Education: Goal: Ability to demonstrate management of disease process will improve Outcome: Progressing Goal: Ability to verbalize understanding of medication therapies will improve Outcome: Progressing   Problem: Activity: Goal: Capacity to carry out activities will improve Outcome: Progressing   Problem: Cardiac: Goal: Ability to achieve and maintain adequate cardiopulmonary perfusion will improve Outcome: Progressing

## 2018-01-24 NOTE — Progress Notes (Signed)
Physical Therapy Treatment Patient Details Name: Ronald Miller. MRN: [id number] DOB: [birth year] Today's Date: 01/24/2018    History of Present Illness 62 y.o. male admitted with encephalopathy, volume overload, acute CHF with dobutamine line disconnected. Paracentesis 4/10 and 4/15, fall 4/16. PMHx: chronic systolic HF s/p Medtronic ICD 2014, CAD s/p CABG in 2000, HTN, Hx of cocaine abuse, Tobacco abuse, Depression, PAF, and COPD. HM3 placed 09/09/2016    PT Comments    Pt agreeable to mobilize after encouragement stating he has no real desire to move around. Pt continues to demonstrate significant confusion, inability to transition power sources, anchor broken for driveline on arrival with pt unaware and LvAD coordinator notified and present to replace. Pt able to confirm he does not have 24hr assist at home, he currently cannot care for himself and LVAD at present although slowly improving mobility and cognition. Pt requires 24hr assist and needs SNF at this time.   HR 69-76 BP 97/86 MAP 91   Follow Up Recommendations  Supervision/Assistance - 24 hour;SNF     Equipment Recommendations  None recommended by PT    Recommendations for Other Services       Precautions / Restrictions Precautions Precautions: Fall Precaution Comments: LVAD    Mobility  Bed Mobility Overal bed mobility: Modified Independent                Transfers Overall transfer level: Needs assistance   Transfers: Sit to/from Stand Sit to Stand: Supervision         General transfer comment: supervision for lines and safety  Ambulation/Gait Ambulation/Gait assistance: Min guard Ambulation Distance (Feet): 150 Feet Assistive device: Rolling walker (2 wheeled) Gait Pattern/deviations: Step-through pattern;Decreased stride length   Gait velocity interpretation: <1.8 ft/sec, indicate of risk for recurrent falls General Gait Details: pt with improved balance and stability as well as gait  tolerance today. Cues for posture, looking up and increasing distance with cues for direction to return to room   Stairs             Wheelchair Mobility    Modified Rankin (Stroke Patients Only)       Balance Overall balance assessment: Needs assistance   Sitting balance-Leahy Scale: Good       Standing balance-Leahy Scale: Poor                              Cognition Arousal/Alertness: Awake/alert Behavior During Therapy: Flat affect Overall Cognitive Status: Impaired/Different from baseline Area of Impairment: Memory;Attention;Following commands;Safety/judgement;Problem solving                   Current Attention Level: Selective Memory: Decreased short-term memory Following Commands: Follows one step commands with increased time Safety/Judgement: Decreased awareness of deficits;Decreased awareness of safety   Problem Solving: Slow processing;Decreased initiation;Difficulty sequencing;Requires verbal cues;Requires tactile cues General Comments: pt continues to need mod cues and assist for power transition and unaware of sequence or mistakes      Exercises      General Comments        Pertinent Vitals/Pain Pain Assessment: No/denies pain    Home Living                      Prior Function            PT Goals (current goals can now be found in the care plan section) Progress towards PT goals: Progressing toward goals  Frequency           PT Plan Discharge plan needs to be updated    Co-evaluation              AM-PAC PT "6 Clicks" Daily Activity  Outcome Measure  Difficulty turning over in bed (including adjusting bedclothes, sheets and blankets)?: None Difficulty moving from lying on back to sitting on the side of the bed? : None Difficulty sitting down on and standing up from a chair with arms (e.g., wheelchair, bedside commode, etc,.)?: A Little Help needed moving to and from a bed to chair (including  a wheelchair)?: A Little Help needed walking in hospital room?: A Little Help needed climbing 3-5 steps with a railing? : A Lot 6 Click Score: 19    End of Session Equipment Utilized During Treatment: Gait belt Activity Tolerance: Patient tolerated treatment well Patient left: in chair;with call bell/phone within reach;with chair alarm set Nurse Communication: Mobility status;Precautions PT Visit Diagnosis: Other abnormalities of gait and mobility (R26.89);Muscle weakness (generalized) (M62.81);Unsteadiness on feet (R26.81)     Time: 4132-4401 PT Time Calculation (min) (ACUTE ONLY): 30 min  Charges:  $Gait Training: 8-22 mins $Therapeutic Activity: 8-22 mins                    G Codes:       Elwyn Reach, PT [phone]    [place] 01/24/2018, 9:07 AM

## 2018-01-25 LAB — BASIC METABOLIC PANEL
Anion gap: 9 (ref 5–15)
BUN: 46 mg/dL — ABNORMAL HIGH (ref 6–20)
CHLORIDE: 83 mmol/L — AB (ref 101–111)
CO2: 32 mmol/L (ref 22–32)
CREATININE: 1.15 mg/dL (ref 0.61–1.24)
Calcium: 8.6 mg/dL — ABNORMAL LOW (ref 8.9–10.3)
GFR calc Af Amer: >60 mL/min (ref >60)
GFR calc non Af Amer: >60 mL/min (ref >60)
GLUCOSE: 167 mg/dL — AB (ref 65–99)
Potassium: 4 mmol/L (ref 3.5–5.1)
SODIUM: 124 mmol/L — AB (ref 135–145)

## 2018-01-25 LAB — GLUCOSE, CAPILLARY
GLUCOSE-CAPILLARY: 145 mg/dL — AB (ref 65–99)
GLUCOSE-CAPILLARY: 159 mg/dL — AB (ref 65–99)
GLUCOSE-CAPILLARY: 218 mg/dL — AB (ref 65–99)
GLUCOSE-CAPILLARY: 246 mg/dL — AB (ref 65–99)

## 2018-01-25 LAB — CBC
HCT: 31.1 % — ABNORMAL LOW (ref 39.0–52.0)
HEMOGLOBIN: 9.6 g/dL — AB (ref 13.0–17.0)
MCH: 23.9 pg — AB (ref 26.0–34.0)
MCHC: 30.9 g/dL (ref 30.0–36.0)
MCV: 77.6 fL — ABNORMAL LOW (ref 78.0–100.0)
Platelets: 164 10*3/uL (ref 150–400)
RBC: 4.01 MIL/uL — ABNORMAL LOW (ref 4.22–5.81)
RDW: 18.7 % — ABNORMAL HIGH (ref 11.5–15.5)
WBC: 7.8 10*3/uL (ref 4.0–10.5)

## 2018-01-25 LAB — PROTIME-INR
INR: 1.91
PROTHROMBIN TIME: 21.7 s — AB (ref 11.4–15.2)

## 2018-01-25 LAB — LACTATE DEHYDROGENASE: LDH: 134 U/L (ref 98–192)

## 2018-01-25 MED ORDER — WARFARIN SODIUM 7.5 MG PO TABS
7.5000 mg | ORAL_TABLET | Freq: Once | ORAL | Status: AC
  Administered 2018-01-25: 7.5 mg via ORAL
  Filled 2018-01-25: qty 1

## 2018-01-25 MED ORDER — WARFARIN SODIUM 7.5 MG PO TABS
7.5000 mg | ORAL_TABLET | Freq: Once | ORAL | Status: DC
  Filled 2018-01-25: qty 1

## 2018-01-25 NOTE — Clinical Social Work Note (Signed)
Patient has a bed at [facility]. Family will have to complete paperwork before he can transfer. Patient stated his sister will be the one to complete paperwork. CSW left her a voicemail. Will start insurance authorization.  Dayton Scrape, [place]

## 2018-01-25 NOTE — Progress Notes (Signed)
[facility] for Warfarin Indication: LVAD  Allergies  Allergen Reactions  . Chlorhexidine Gluconate Itching and Rash  . Codeine Nausea And Vomiting and Other (See Comments)    In high doses  . Lipitor [Atorvastatin] Nausea Only and Other (See Comments)    Nausea with high doses, tolerates 20mg  dose (08/22/16)  . Tape Other (See Comments)    Paper tape is ok   Patient Measurements: Height: 5\' 9"  (175.3 cm) Weight: 190 lb 11.2 oz (86.5 kg) IBW/kg (Calculated) : 70.7 Height: 5'9"  Vital Signs: Temp: 97.7 F (36.5 C) (04/24 0709) Temp Source: Oral (04/24 0709) BP: 87/58 (04/24 0709) Pulse Rate: 60 (04/24 0709)  Labs: Recent Labs    01/23/18 0555 01/24/18 0435 01/25/18 0350  HGB 10.4* 9.9* 9.6*  HCT 33.0* 31.5* 31.1*  PLT 187 175 164  LABPROT 22.1* 22.2* 21.7*  INR 1.95 1.96 1.91  CREATININE 1.47* 1.29* 1.15   Estimated Creatinine Clearance: 73.5 mL/min (by C-G formula based on SCr of 1.15 mg/dL).  Assessment: 66 yoM on warfarin PTA for LVAD.  INR is therapeutic at 1.91 today. Hgb is 9.6, plts 164, LDH 134. No signs/symptoms of bleeding. Remains on home amiodarone.  PTA regimen: 7.5mg  on all days except 5mg  on Mondays and Fridays (admit INR 2.2)  Goal of Therapy:  INR 1.8-2.4 Monitor platelets by anticoagulation protocol: Yes   Plan:  Warfarin 7.5 mg PO x1 tonight >> can resume home regimen at discharge Daily INR and CBC Monitor for s/sx of bleeding  Doylene Canard, PharmD Clinical Pharmacist  Pager: [phone] Clinical Phone for 01/25/2018 until 3:30pm: [phone] If after 3:30pm, please call main pharmacy at [phone] 01/25/2018

## 2018-01-25 NOTE — Plan of Care (Signed)
Continue current care plan 

## 2018-01-25 NOTE — Progress Notes (Addendum)
Patient ID: Ronald Schriver., male   DOB: 08/01/1956, 62 y.o.   MRN: [id number]   Advanced Heart Failure VAD Team Note  PCP-Cardiologist: Glori Bickers, MD   Subjective:    Admitted with massive volume overload and disconnected dobutamine line. PICC line appeared cut.   Paracentesis 01/11/18 with 4.8 L off.  Paracentesis 01/16/18 with 2.4 liters off.   Unwitnessed fall on 4/16. CT of head negative.   Continues to be weak. Not ambulating much. Confusion resolved. Yesterday started on demeclocycline. Sodium trending up 120>124.   Agreeable to SNF. Denies SOB.    LVAD INTERROGATION:  HeartMate II LVAD:  Flow 5.5 liters/min, speed 5800, power 5.0 PI 2.3   Objective:    Vital Signs:   Temp:  [97.7 F (36.5 C)-98.6 F (37 C)] 97.7 F (36.5 C) (04/24 0709) Pulse Rate:  [60-76] 60 (04/24 0709) Resp:  [14-32] 20 (04/24 0709) BP: (81-97)/(58-86) 87/58 (04/24 0709) SpO2:  [97 %-98 %] 97 % (04/24 0709) Weight:  [190 lb 11.2 oz (86.5 kg)] 190 lb 11.2 oz (86.5 kg) (04/24 0647) Last BM Date: 01/24/18 Mean arterial Pressure 70s  Intake/Output:   Intake/Output Summary (Last 24 hours) at 01/25/2018 0805 Last data filed at 01/25/2018 0709 Gross per 24 hour  Intake 1125 ml  Output 3950 ml  Net -2825 ml     Physical Exam: CVP 15  General:  NAD.  HEENT: normal  Neck: supple. JVP to jaw.  Carotids 2+ bilat; no bruits. No lymphadenopathy or thryomegaly appreciated. Cor: LVAD hum.  Lungs: Clear. Abdomen: obese soft, nontender, non-distended. No hepatosplenomegaly. No bruits or masses. Good bowel sounds. Driveline site clean. Anchor in place.  Extremities: no cyanosis, clubbing, rash. Warm no edema  Neuro: alert & oriented x 3. No focal deficits. Moves all 4 without problem    Telemetry   A  Sensed V paced 60-70s  Personally reviewed   EKG    No new tracings.    Labs   Basic Metabolic Panel: Recent Labs  Lab 01/21/18 0500 01/22/18 0428 01/23/18 0555 01/24/18 0435  01/25/18 0350  NA 125* 125* 122* 120* 124*  K 3.3* 3.8 3.0* 3.9 4.0  CL 79* 78* 78* 80* 83*  CO2 33* 33* 30 30 32  GLUCOSE 149* 157* 154* 183* 167*  BUN 44* 51* 54* 47* 46*  CREATININE 1.41* 1.76* 1.47* 1.29* 1.15  CALCIUM 8.8* 9.2 8.9 8.5* 8.6*    Liver Function Tests: No results for input(s): AST, ALT, ALKPHOS, BILITOT, PROT, ALBUMIN in the last 168 hours. No results for input(s): LIPASE, AMYLASE in the last 168 hours. Recent Labs  Lab 01/21/18 0500 01/22/18 0427  AMMONIA 63* 67*    CBC: Recent Labs  Lab 01/21/18 0500 01/22/18 0428 01/23/18 0555 01/24/18 0435 01/25/18 0350  WBC 7.3 6.4 6.7 8.1 7.8  HGB 10.5* 11.1* 10.4* 9.9* 9.6*  HCT 33.3* 35.4* 33.0* 31.5* 31.1*  MCV 77.4* 76.8* 76.7* 77.2* 77.6*  PLT 195 207 187 175 164    INR: Recent Labs  Lab 01/21/18 0500 01/22/18 0428 01/23/18 0555 01/24/18 0435 01/25/18 0350  INR 1.92 1.96 1.95 1.96 1.91    Other results:    Imaging   No results found.   Medications:     Scheduled Medications: . amiodarone  200 mg Oral Daily  . citalopram  20 mg Oral Daily  . demeclocycline  150 mg Oral Q12H  . digoxin  0.125 mg Oral Daily  . docusate sodium  200 mg Oral QHS  .  gabapentin  300 mg Oral BID  . insulin aspart  5 Units Subcutaneous TID WC  . insulin glargine  10 Units Subcutaneous Q2200  . lactulose  45 g Oral TID  . magnesium oxide  400 mg Oral Daily  . midodrine  10 mg Oral TID WC  . multivitamin with minerals  1 tablet Oral Daily  . pantoprazole  40 mg Oral BID  . potassium chloride  40 mEq Oral BID  . spironolactone  25 mg Oral Daily  . sucralfate  1 g Oral TID WC & HS  . torsemide  100 mg Oral BID  . Warfarin - Pharmacist Dosing Inpatient   Does not apply q1800    Infusions: . DOBUTamine 7.5 mcg/kg/min (01/24/18 1600)    PRN Medications: acetaminophen, cyclobenzaprine, ondansetron (ZOFRAN) IV, ondansetron, senna-docusate, sodium chloride flush, traZODone   Patient Profile    Ronald Berling. is a 62 y.o. male  with history of chronic systolic HF s/p Medtronic ICD 2014, CAD s/p CABG in 2000, HTN, Hx of cocaine abuse, Tobacco abuse, Depression, PAF, and COPD. HM3 placed 09/09/2016.   Admitted from clinic with encephalopathy and acute on chronic CHF. Dobutamine disconnected sometime overnight.    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 Has Medtronic ICD. Admitted with volume overload and acute encephalopathy.  - VAD interrogated personally. Parameters stable.  -CVP 15.  - Volume status ok. Continue torsemide 100 mg BID. WIll not push further with RV failure.  - Creatinine 1.15. Stable.  - Continue dobutamine 7.5 mcg/kg/min (on chronically for RV failure). We will need [facility] to provide dobutamine at SNF. I will update Ronald Miller with [facility]  - Continue midodrine 10 mg tid. - Not a good heart transplant candidate. Has reached end stage biventricular failure.  - Planning for SNF on d/c (Ronald Miller) 2. CAD: Stable.  - No s/s of ischemia.    3. DMII:  - SSI 4. PAF:  - In NSR on po amio.  - on coumadin.   5. Anemia, iron deficiency: Had capsule endoscopy 04/2017 with severe gastritis. Continue carafate.  -hg improved 10.5->11.1->10.4 -> 9.9->9.6  6. Anxiety/Depression: Continue celexa. Stable.  7. H/O RUE DVT: On coumadin.   - No change to current plan.   8. Chronic Anticoagulation: On coumadin, INR goal 1.8 - 2.4.  - INR 1.9. Stable.  - Discussed dosing personally with Pharm-D.  9. Cardiac cirrhosis with ascites: Had Paracentesis on 4/10 with 4.8 L out.  S/P Paracentesis 4/15with 2.4 liters removed.   - Improved.  10. Confusion/Hepatic Encephalopathy: NH3 has been high, we have increased lactulose.  - Continue higher dose of lactulose-->45 tid with improvement of ammonia and symptoms.  - check ammonia in am.  11. Hyponatremia: -Sodium 124. On demeclocycline 150 mg twice a day.  Continue to restrict free water  12.  Fall- unwitnessed 4/16. CT of head negative.  - - No change to current plan.   13. Hypokalemia- Resolved.    To SNF once bed available/insurance approved. SW following.   I reviewed the LVAD parameters from today, and compared the results to the patient's prior recorded data.  No programming changes were made.  The LVAD is functioning within specified parameters.  The patient performs LVAD self-test daily.  LVAD interrogation was negative for any significant power changes, alarms or PI events/speed drops.  LVAD equipment check completed and is in good working order.  Back-up equipment present.   LVAD education done on emergency  procedures and precautions and reviewed exit site care.   Length of Stay: [place], NP 01/25/2018, 8:05 AM  VAD Team --- VAD ISSUES ONLY--- Pager [phone] (7am - 7am)  Advanced Heart Failure Team  Pager [phone] (M-F; 7a - 4p)  Please contact [facility] for night-coverage after hours (4p -7a ) and weekends on [email or web address]  Patient seen and examined with Darrick Grinder, NP. We discussed all aspects of the encounter. I agree with the assessment and plan as stated above .  Overall very tenuous but I think this is as good as we will get him. 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 Now complicated by hepatic encephalopathy and failure to thrive. Demclocylcine started for hyponatremia. Unable to care for himself. Has SNF bed. Pending insurance approval for transfer.  VAD interrogated personally. Parameters stable. INR stable at 1.9. Nearing point where he will need Hospice.   Glori Bickers, MD  8:34 PM

## 2018-01-25 NOTE — Progress Notes (Signed)
CSW awaiting [employer] authorization for SNF. CSW spoke to patient's sister, Karna Christmas. Terri requested [facility] admission paperwork be faxed to her at [phone]. CSW updated [facility] and they will fax paperwork to sister. CSW to follow.  Estanislado Emms, [place]

## 2018-01-25 NOTE — Evaluation (Signed)
Occupational Therapy Evaluation Patient Details Name: Ronald Miller. MRN: [id number] DOB: 1956/04/05 Today's Date: 01/25/2018    History of Present Illness 62 y.o. male admitted with encephalopathy, volume overload, acute CHF with dobutamine line disconnected. Paracentesis 4/10 and 4/15, fall 4/16. PMHx: chronic systolic HF s/p Medtronic ICD 2014, CAD s/p CABG in 2000, HTN, Hx of cocaine abuse, Tobacco abuse, Depression, PAF, and COPD. HM3 placed 09/09/2016   Clinical Impression   PTA Pt independent in ADL and mobility - still driving and managing LVAD independently. Pt is currently requiring mod A (cues) for LVAD management, min guard assist for sit to stand transfers, and sitting for ADL instead of standing. Pt will require skilled OT in the acute setting as well as afterwards at the SNF level to maximize safety and independence in ADL and functional transfers and regain independence in LVAD management. Next session to work on [place] transfers as well as focus on LVAD power source switch over.     Follow Up Recommendations  SNF;Supervision/Assistance - 24 hour    Equipment Recommendations  Other (comment)(defer to next venue)    Recommendations for Other Services       Precautions / Restrictions Precautions Precautions: Fall Precaution Comments: LVAD Restrictions Weight Bearing Restrictions: No      Mobility Bed Mobility Overal bed mobility: Modified Independent             General bed mobility comments: required increased time and use of bed rails  Transfers Overall transfer level: Needs assistance Equipment used: 1 person hand held assist Transfers: Sit to/from Stand Sit to Stand: Min guard;Min assist         General transfer comment: min guard asssist for balance    Balance Overall balance assessment: Needs assistance Sitting-balance support: Single extremity supported;Feet supported Sitting balance-Leahy Scale: Fair Sitting balance - Comments: fatigues  quickly Postural control: Other (comment)(increasingly kyphotic sitting EOB) Standing balance support: Single extremity supported Standing balance-Leahy Scale: Poor                             ADL either performed or assessed with clinical judgement   ADL Overall ADL's : Needs assistance/impaired Eating/Feeding: Set up;Sitting   Grooming: Wash/dry hands;Oral care;Wash/dry face;Set up;Sitting Grooming Details (indicate cue type and reason): EOB Upper Body Bathing: Minimal assistance;Sitting   Lower Body Bathing: Minimal assistance;Sitting/lateral leans   Upper Body Dressing : Moderate assistance;Cueing for sequencing;Sitting Upper Body Dressing Details (indicate cue type and reason): LVAD management Lower Body Dressing: Minimal assistance;Sitting/lateral leans Lower Body Dressing Details (indicate cue type and reason): to don socks Toilet Transfer: Min guard;Minimal assistance;BSC   Toileting- Clothing Manipulation and Hygiene: Moderate assistance       Functional mobility during ADLs: Min guard;Minimal assistance(small steps up the bed)       Vision Patient Visual Report: No change from baseline       Perception     Praxis      Pertinent Vitals/Pain Pain Assessment: 0-10 Pain Score: 4  Pain Location: Bil Feet Pain Descriptors / Indicators: Aching Pain Intervention(s): Monitored during session;Repositioned;RN gave pain meds during session     Hand Dominance     Extremity/Trunk Assessment Upper Extremity Assessment Upper Extremity Assessment: Generalized weakness   Lower Extremity Assessment Lower Extremity Assessment: Generalized weakness   Cervical / Trunk Assessment Cervical / Trunk Assessment: Kyphotic   Communication Communication Communication: No difficulties   Cognition Arousal/Alertness: Awake/alert Behavior During Therapy: Flat affect Overall  Cognitive Status: Impaired/Different from baseline Area of Impairment:  Memory;Attention;Following commands;Safety/judgement;Problem solving                   Current Attention Level: Selective Memory: Decreased short-term memory Following Commands: Follows one step commands with increased time Safety/Judgement: Decreased awareness of deficits;Decreased awareness of safety   Problem Solving: Slow processing;Decreased initiation;Difficulty sequencing;Requires verbal cues;Requires tactile cues     General Comments       Exercises     Shoulder Instructions      Home Living Family/patient expects to be discharged to:: Private residence Living Arrangements: Alone Available Help at Discharge: Family;Available 24 hours/day Type of Home: House Home Access: Stairs to enter [employer] of Steps: 2 Entrance Stairs-Rails: Right Home Layout: One level     Bathroom Shower/Tub: Teacher, early years/pre: Standard Bathroom Accessibility: Yes How Accessible: Accessible via walker Home Equipment: [place] - 4 wheels          Prior Functioning/Environment Level of Independence: Independent with assistive device(s)        Comments: Pt had been functioning Independently with LVAD .  Was driving.        OT Problem List: Decreased activity tolerance;Impaired balance (sitting and/or standing);Decreased cognition;Decreased safety awareness      OT Treatment/Interventions: Self-care/ADL training;Therapeutic exercise;Energy conservation;DME and/or AE instruction;Therapeutic activities;Patient/family education;Balance training    OT Goals(Current goals can be found in the care plan section) Acute Rehab OT Goals Patient Stated Goal: return home OT Goal Formulation: With patient Time For Goal Achievement: 02/08/18 Potential to Achieve Goals: Good ADL Goals Pt Will Perform Grooming: with modified independence;standing Pt Will Transfer to Toilet: with modified independence;ambulating Pt Will Perform Toileting - Clothing Manipulation  and hygiene: with modified independence;sit to/from stand Additional ADL Goal #1: Pt will perform power source change over at independent level prior to engaging in ADL routine  OT Frequency: Min 2X/week   Barriers to D/C: Decreased caregiver support  Pt lives alone       Co-evaluation              AM-PAC PT "6 Clicks" Daily Activity     Outcome Measure Help from another person eating meals?: None Help from another person taking care of personal grooming?: A Little Help from another person toileting, which includes using toliet, bedpan, or urinal?: A Little Help from another person bathing (including washing, rinsing, drying)?: A Lot Help from another person to put on and taking off regular upper body clothing?: A Lot Help from another person to put on and taking off regular lower body clothing?: A Little 6 Click Score: 17   End of Session Equipment Utilized During Treatment: Gait belt Nurse Communication: Mobility status  Activity Tolerance: Patient tolerated treatment well;Patient limited by pain Patient left: in bed;with call bell/phone within reach;with nursing/sitter in room  OT Visit Diagnosis: Unsteadiness on feet (R26.81);Other abnormalities of gait and mobility (R26.89);Muscle weakness (generalized) (M62.81);Other symptoms and signs involving cognitive function                Time: 6144-3154 OT Time Calculation (min): 23 min Charges:  OT General Charges $OT Visit: 1 Visit OT Evaluation $OT Eval Moderate Complexity: 1 Mod OT Treatments $Self Care/Home Management : 8-22 mins G-Codes:     Hulda Humphrey OTR/L [place] 01/25/2018, 4:29 PM

## 2018-01-25 NOTE — Discharge Summary (Addendum)
Advanced Heart Failure Team  Discharge Summary   Patient ID: Ronald Miller. MRN: [id number], DOB/AGE: [birth year] 62 y.o. 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 Has Medtronic ICD  On Dobutamine 7.5 mcg  2. CAD 3. DMII 4. PAF 5. Anemia, iron deficiency  6. Anxiety/Depression 7. H/O RUE DVT 8. Chronic Anticoagulation  On coumadin INR goal 1.8-2.3  9. Cardiac cirrhosis with ascites  S/P Paracentesis 4/10 and 01/16/18 10. Confusion/Hepatic Encephalopathy  On lactulose. Ammonia down to 20 on 01/26/2018  11. Hyponatremia 12. Fall 13. Hypokalemia  Hospital Course:   Ronald Miller. is a 62 y.o. malewith history of chronic systolic HF s/p Medtronic ICD 2014, CAD s/p CABG in 2000, HTN, Hx of cocaine abuse, Tobacco abuse, Depression, PAF, and COPD. HM3 placed 09/09/2016.   Admitted from clinic 01/10/18 with encephalopathy and acute on chronic CHF. Pt had somehow disconnected his dobutamine overnight.  The line was "fractured" in 2 places. Pt started back on dobutamine in the clinic with rapid improvement, and admitted for marked volume overload.   Pt started on IV lasix up to lasix gtt at 20mg /hr + metolazone. Weight noted to be about 30 lbs from baseline with abdominal distention and DOE with minimal exertion. Ammonia elevated at 72 on admit, but improved with chronic dose lactulose and diuresis.   Pt required Paracentesis 4/10 AND 01/16/18 with 4.8L and 2.4 L off, respectively.   Pt continued to diurese. Meds adjusted as tolerated.  Overall, Pt down 31 lb from admit in setting of diuresis and paracentesis. Transitioned back to torsemide 100 mg BID with metolazone as needed.   Hospital course complicated by unwitnessed fall 01/2015. CT of head negative. Pt also noted to have hyponatremia down to 120. Started on Demeclocycline 150 mg BID and free water restricted. This was later  stopped as sodium improved.   [facility] will continue to follow for dobutamine. Tunneled PICC was replaced on 4/25 by IR.   With multiple admission and major concerns for patients ability to self care, arranged for SNF placement on discharge.   Pt examined am of 01/27/2018 and determined to be stable for discharge to SNF. He will have close follow up in the VAD clinic as below. We will check INR/CMET,CBC next week at his follow up appointment.   LVAD INTERROGATION:  HeartMate III LVAD:  Flow 5.6   liters/min, speed 5800, power 5  PI 2.2       Discharge Weight: 189 pounds.  Discharge Vitals: Blood pressure 100/89, pulse (!) 52, temperature 98.2 F (36.8 C), temperature source Oral, resp. rate 16, height 5\' 9"  (1.753 m), weight 189 lb 6.4 oz (85.9 kg), SpO2 91 %.  Labs: Lab Results  Component Value Date   WBC 6.1 01/27/2018   HGB 9.8 (L) 01/27/2018   HCT 31.6 (L) 01/27/2018   MCV 78.2 01/27/2018   PLT 164 01/27/2018    Recent Labs  Lab 01/27/18 0350  NA 126*  K 4.8  CL 87*  CO2 30  BUN 35*  CREATININE 1.07  CALCIUM 8.6*  GLUCOSE 187*   Lab Results  Component Value Date   CHOL 153 08/15/2013   HDL 37 (L) 08/15/2013   LDLCALC 89 08/15/2013   TRIG 133 08/15/2013   BNP (last 3 results) No results for input(s): BNP in the last 8760 hours.  ProBNP (last 3 results) No results for input(s): PROBNP in the  last 8760 hours.   Diagnostic Studies/Procedures   Ir Fluoro Guide Cv Line Right  Result Date: 01/26/2018 CLINICAL DATA:  Heart failure, needs durable venous access for dobutamine infusion EXAM: TUNNELED CENTRAL VENOUS CATHETER PLACEMENT WITH ULTRASOUND AND FLUOROSCOPIC GUIDANCE TECHNIQUE: The procedure, risks, benefits, and alternatives were explained to the patient. Questions regarding the procedure were encouraged and answered. The patient understands and consents to the procedure. Patency of the right IJ vein was confirmed with ultrasound with image documentation. An  appropriate skin site was determined. Region was prepped using maximum barrier technique including cap and mask, sterile gown, sterile gloves, large sterile sheet, and Chlorhexidine as cutaneous antisepsis. The region was infiltrated locally with 1% lidocaine. Under real-time ultrasound guidance, the right IJ vein was accessed with a 21 gauge micropuncture needle; the needle tip within the vein was confirmed with ultrasound image documentation. 54F single lumen cuffed powerPICC tunneled from a right anterior chest wall approach to the dermatotomy site. Needle exchanged over the 018 guidewire for transitional dilator, through which the catheter which had been cut to 23 cm was advanced under intermittent fluoroscopy, positioned with its tip at the cavoatrial junction. Spot chest radiograph confirms good catheter position. No pneumothorax. Catheter was flushed per protocol. Catheter secured externally with O Prolene suture. The right IJ dermatotomy site was closed with Dermabond. COMPLICATIONS: COMPLICATIONS None immediate FLUOROSCOPY TIME:  12 seconds, 2 mGy COMPARISON:  None IMPRESSION: 1. Technically successful placement of tunneled right IJ tunneled single lumen power injectable catheter with ultrasound and fluoroscopic guidance. Ready for routine use. Electronically Signed   By: Lucrezia Europe M.D.   On: 01/26/2018 15:44   Ir US Guide Vasc Access Right  Result Date: 01/26/2018 CLINICAL DATA:  Heart failure, needs durable venous access for dobutamine infusion EXAM: TUNNELED CENTRAL VENOUS CATHETER PLACEMENT WITH ULTRASOUND AND FLUOROSCOPIC GUIDANCE TECHNIQUE: The procedure, risks, benefits, and alternatives were explained to the patient. Questions regarding the procedure were encouraged and answered. The patient understands and consents to the procedure. Patency of the right IJ vein was confirmed with ultrasound with image documentation. An appropriate skin site was determined. Region was prepped using maximum  barrier technique including cap and mask, sterile gown, sterile gloves, large sterile sheet, and Chlorhexidine as cutaneous antisepsis. The region was infiltrated locally with 1% lidocaine. Under real-time ultrasound guidance, the right IJ vein was accessed with a 21 gauge micropuncture needle; the needle tip within the vein was confirmed with ultrasound image documentation. 54F single lumen cuffed powerPICC tunneled from a right anterior chest wall approach to the dermatotomy site. Needle exchanged over the 018 guidewire for transitional dilator, through which the catheter which had been cut to 23 cm was advanced under intermittent fluoroscopy, positioned with its tip at the cavoatrial junction. Spot chest radiograph confirms good catheter position. No pneumothorax. Catheter was flushed per protocol. Catheter secured externally with O Prolene suture. The right IJ dermatotomy site was closed with Dermabond. COMPLICATIONS: COMPLICATIONS None immediate FLUOROSCOPY TIME:  12 seconds, 2 mGy COMPARISON:  None IMPRESSION: 1. Technically successful placement of tunneled right IJ tunneled single lumen power injectable catheter with ultrasound and fluoroscopic guidance. Ready for routine use. Electronically Signed   By: Lucrezia Europe M.D.   On: 01/26/2018 15:44    Discharge Medications   Allergies as of 01/27/2018      Reactions   Chlorhexidine Gluconate Itching, Rash   Codeine Nausea And Vomiting, Other (See Comments)   In high doses   Lipitor [atorvastatin] Nausea Only, Other (See  Comments)   Nausea with high doses, tolerates 20mg  dose (08/22/16)   Tape Other (See Comments)   Paper tape is ok      Medication List    TAKE these medications   acetaminophen 500 MG tablet Commonly known as:  TYLENOL Take 1,000 mg by mouth every 6 (six) hours as needed for moderate pain or headache.   amiodarone 200 MG tablet Commonly known as:  PACERONE TAKE ONE TABLET BY MOUTH ONCE DAILY What changed:    how much to  take  how to take this  when to take this   citalopram 20 MG tablet Commonly known as:  CELEXA Take 1 tablet (20 mg total) by mouth daily.   cyclobenzaprine 10 MG tablet Commonly known as:  FLEXERIL Take 1 tablet (10 mg total) by mouth 3 (three) times daily as needed for muscle spasms.   digoxin 0.125 MG tablet Commonly known as:  DIGOX Take 1 tablet (0.125 mg total) daily by mouth.   DOBUTamine 4-5 MG/ML-% infusion Commonly known as:  DOBUTREX Inject 714.75 mcg/min into the vein continuous. PER [facility] pharmacy   docusate sodium 100 MG capsule Commonly known as:  COLACE Take 200 mg by mouth at bedtime as needed for mild constipation.   gabapentin 300 MG capsule Commonly known as:  NEURONTIN Take 1 capsule (300 mg total) by mouth 2 (two) times daily.   insulin aspart 100 UNIT/ML FlexPen Commonly known as:  NOVOLOG FLEXPEN Inject 5 Units into the skin 3 (three) times daily with meals.   Insulin Glargine 100 UNIT/ML Solostar Pen Commonly known as:  LANTUS SOLOSTAR Inject 10 Units into the skin daily at 10 pm.   lactulose 10 GM/15ML solution Commonly known as:  CHRONULAC Take 67.5 mLs (45 g total) by mouth 3 (three) times daily. What changed:    how much to take  when to take this   magnesium oxide 400 (241.3 Mg) MG tablet Commonly known as:  MAG-OX Take 1 tablet (400 mg total) by mouth daily.   metolazone 2.5 MG tablet Commonly known as:  ZAROXOLYN Take 2.5 mg by mouth See admin instructions. Take one tablet (2.5mg ) by mouth on Fridays and Mondays.   midodrine 10 MG tablet Commonly known as:  PROAMATINE Take 1 tablet (10 mg total) by mouth 3 (three) times daily with meals.   multivitamin with minerals Tabs tablet Take 1 tablet by mouth daily.   ondansetron 4 MG disintegrating tablet Commonly known as:  ZOFRAN ODT Take 1 tablet (4 mg total) by mouth every 8 (eight) hours as needed for nausea.   pantoprazole 40 MG tablet Commonly known as:  PROTONIX TAKE 1  TABLET BY MOUTH TWICE DAILY WITH A MEAL What changed:  See the new instructions.   potassium chloride SA 20 MEQ tablet Commonly known as:  K-DUR,KLOR-CON Take 2 tablets (40 mEq total) by mouth 2 (two) times daily. Take 40 meq in am and 20 meq in pm What changed:    how much to take  when to take this  additional instructions   senna-docusate 8.6-50 MG tablet Commonly known as:  SENOKOT S Take 2 tablets by mouth at bedtime as needed for mild constipation.   spironolactone 25 MG tablet Commonly known as:  ALDACTONE Take 1 tablet (25 mg total) by mouth daily. What changed:  how much to take   sucralfate 1 GM/10ML suspension Commonly known as:  CARAFATE Take 10 mLs (1 g total) by mouth 4 (four) times daily -  with  meals and at bedtime.   torsemide 100 MG tablet Commonly known as:  DEMADEX Take 1 tablet (100 mg total) by mouth 2 (two) times daily. Take 100mg  in the morning and 50mg  in the evening What changed:    how much to take  how to take this  when to take this   traZODone 50 MG tablet Commonly known as:  DESYREL Take 1 tablet (50 mg total) by mouth at bedtime as needed for sleep.   warfarin 5 MG tablet Commonly known as:  COUMADIN Take as directed. If you are unsure how to take this medication, talk to your nurse or doctor. Original instructions:  Take 5-7.5 mg by mouth See admin instructions. Take 1 and 1/2 tablets (7.5 mg) daily except 1 tablet (5 mg) on Monday and Friday            Durable Medical Equipment  (From admission, onward)        Start     Ordered   01/26/18 0821  Heart failure home health orders  (Heart failure home health orders / Face to face)  Once    Comments:  Heart Failure Follow-up Care:  Verify follow-up appointments per Patient Discharge Instructions. Confirm transportation arranged. Reconcile home medications with discharge medication list. Remove discontinued medications from use. Assist patient/caregiver to manage medications  using pill box. Reinforce low sodium food selection Assessments: Vital signs and oxygen saturation at each visit. Assess home environment for safety concerns, caregiver support and availability of low-sodium foods. Consult Education officer, museum, PT/OT, Dietitian, and CNA based on assessments. Perform comprehensive cardiopulmonary assessment. Notify MD for any change in condition or weight gain of 3 pounds in one day or 5 pounds in one week with symptoms. Daily Weights and Symptom Monitoring: Ensure patient has access to scales. Teach patient/caregiver to weigh daily before breakfast and after voiding using same scale and record.    Teach patient/caregiver to track weight and symptoms and when to notify Provider. Activity: Develop individualized activity plan with patient/caregiver.   Home dobutamine 7.5 mcg. Needs home health. Resumption of care when discharged.  7.5 mcg /kg/min x 52 refills per [facility] pharamacy  Question Answer Comment  Heart Failure Follow-up Care Advanced Heart Failure ([facility]F) Clinic at [phone]   Obtain the following labs Basic Metabolic Panel   Lab frequency Weekly   Fax lab results to AHF Clinic at [phone]   Diet Low Sodium Heart Healthy   Fluid restrictions: 2000 mL Fluid   Initiate Heart Failure Clinic Diuretic Protocol to be used by [place] only ( to be ordered by Heart Failure Team Providers Only) Yes      01/26/18 0821      Disposition   The patient will be discharged in stable condition to [facility]. Close VAD follow up as below.  Discharge Instructions    (HEART FAILURE PATIENTS) Call MD:  Anytime you have any of the following symptoms: 1) 3 pound weight gain in 24 hours or 5 pounds in 1 week 2) shortness of breath, with or without a dry hacking cough 3) swelling in the hands, feet or stomach 4) if you have to sleep on extra pillows at night in order to breathe.   Complete by:  As directed    Diet - low sodium heart healthy    Complete by:  As directed    Discharge instructions   Complete by:  As directed    Contjnue sliding scale coverage per facility   Heart Failure  patients record your daily weight using the same scale at the same time of day   Complete by:  As directed    INR  Goal: 1.8 - 2.3   Complete by:  As directed    Goal:  1.8 - 2.3   Increase activity slowly   Complete by:  As directed    PT/OT consult at [facility]   Page VAD Coordinator at [phone]  Notify for: any VAD alarms, sustained elevations of power >10 watts, sustained drop in Pulse Index <3   Complete by:  As directed    Notify for:   any VAD alarms sustained elevations of power >10 watts sustained drop in Pulse Index <3     Speed Settings:   Complete by:  As directed    Fixed 5800RPM Low 5500 RPM      Contact information for follow-up providers    Bensimhon, Shaune Pascal, MD Follow up on 02/03/2018.   Specialty:  Cardiology Why:  at 1000 Contact information: [place] [address] [phone]            Contact information for after-discharge care    Destination    [facility] .   Service:  Skilled Nursing Contact information: [place] [phone]                    Duration of Discharge Encounter: Greater than 35 minutes   Signed, Darrick Grinder, NP  01/27/2018, 8:46 AM  PICC line has been replaced. Volume status stable. 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 VAD interrogated personally. Parameters stable.  Ok for d/c to SNF today.   Glori Bickers, MD  3:17 PM

## 2018-01-25 NOTE — Progress Notes (Signed)
LVAD Coordinator Rounding Note:  Admitted 01/11/18 to Dr. Haroldine Laws due to acute encephalopathy and CHF with fluid overload. Home Dobutamine gtt disconnected.     HM III LVAD implanted on 09/09/16 by Dr. Cyndia Bent under Destination Therapy criteria.  Vital signs: Temp:  97.6 HR: 61 Doppler Pressure:  Automatic BP:   O2 Sat: 98% on RA Wt: 220>222>213>208>212>216>198>187>185>183>187>190 lbs  LVAD interrogation reveals:  Speed: 5800 Flow: 5.4 Power: 4.5 w PI:  2.1 Alarms: none Events: 5 PI events Hematocrit:  31 Fixed speed:  5800 Low speed limit: 5500  Drive Line: left abdominal sorbaview dressing dry and intact; anchor intact and accurately applied. Change weekly; due [facility] April 25. Driveline anchored and secured.  Labs:  LDH trend: 159>136>140>141>132>132>137>139>136>127>124>134 INR trend: 2.2>2.21>2.82>3.04>1.83>1.91>1.97>1.82>2.01>1.92>1.96>1.95>1.91  Gtts: Dobutamine 7.5 mcg/kg/min  Anticoagulation Plan: -INR Goal:  1.8 - 2.3 -ASA Dose: no ASA (history of GI bleeding)  Device: -Protect; end of service 08/2016 -Therapies:  Off Last check: complete interrogation 05/26/2017. DDD (lower rate 60); BiV pacing with 96% AS-VP  Adverse Events on VAD: 11/30/16> Milrinone gtt at discharge 01/27/17>dobutamine at 5 mcg/kg/mingtt at discharge 03/08/17> RV failure 04/29/17> symptomatic anemia and melena. EGD revealed stomach ulcers. Carafate added; ASA stopped 05/31/17>Ongoing symptomatic anemia and melena; capsule study mild antral gastritis; poss tiny AVM right colon. Lower MAPS, Losartan cut back. 06/2017> admit with syncope, AMS, Dobutamine at 7.74mcg 11/09/17>Marked ascites, RV failure, and hepatic encephalopathy. Paracentesis on 2/7 and 2/12 with >11 liters fluid removed 12/27/17>Outpatient paracentesis with 2.8 liters removed 01/11/18>IP paracentesis with 4.8 liters removed   Plan/Recommendations:  1. Will need weekly dressing changes; due [facility] April 25. Bedside RN may change  dressing. 2. Please call VAD pager with any VAD equipment or drive line issues. 3. Planned discharge to [place] RN, VAD Coordiantor 24/7 VAD pager: [phone]

## 2018-01-26 ENCOUNTER — Encounter (HOSPITAL_COMMUNITY): Payer: Self-pay | Admitting: Interventional Radiology

## 2018-01-26 ENCOUNTER — Inpatient Hospital Stay (HOSPITAL_COMMUNITY): Payer: Medicare HMO

## 2018-01-26 HISTORY — PX: IR US GUIDE VASC ACCESS RIGHT: IMG2390

## 2018-01-26 HISTORY — PX: IR FLUORO GUIDE CV LINE RIGHT: IMG2283

## 2018-01-26 LAB — BASIC METABOLIC PANEL
ANION GAP: 9 (ref 5–15)
BUN: 40 mg/dL — ABNORMAL HIGH (ref 6–20)
CHLORIDE: 84 mmol/L — AB (ref 101–111)
CO2: 30 mmol/L (ref 22–32)
Calcium: 8.4 mg/dL — ABNORMAL LOW (ref 8.9–10.3)
Creatinine, Ser: 1.08 mg/dL (ref 0.61–1.24)
GFR calc Af Amer: >60 mL/min (ref >60)
GFR calc non Af Amer: >60 mL/min (ref >60)
GLUCOSE: 204 mg/dL — AB (ref 65–99)
POTASSIUM: 4.5 mmol/L (ref 3.5–5.1)
Sodium: 123 mmol/L — ABNORMAL LOW (ref 135–145)

## 2018-01-26 LAB — GLUCOSE, CAPILLARY
GLUCOSE-CAPILLARY: 229 mg/dL — AB (ref 65–99)
Glucose-Capillary: 164 mg/dL — ABNORMAL HIGH (ref 65–99)
Glucose-Capillary: 135 mg/dL — ABNORMAL HIGH (ref 65–99)
Glucose-Capillary: 214 mg/dL — ABNORMAL HIGH (ref 65–99)

## 2018-01-26 LAB — CBC
HEMATOCRIT: 31.1 % — AB (ref 39.0–52.0)
Hemoglobin: 9.7 g/dL — ABNORMAL LOW (ref 13.0–17.0)
MCH: 24.4 pg — ABNORMAL LOW (ref 26.0–34.0)
MCHC: 31.2 g/dL (ref 30.0–36.0)
MCV: 78.3 fL (ref 78.0–100.0)
Platelets: 173 10*3/uL (ref 150–400)
RBC: 3.97 MIL/uL — AB (ref 4.22–5.81)
RDW: 19.1 % — ABNORMAL HIGH (ref 11.5–15.5)
WBC: 8.1 10*3/uL (ref 4.0–10.5)

## 2018-01-26 LAB — PROTIME-INR
INR: 1.81
Prothrombin Time: 20.8 seconds — ABNORMAL HIGH (ref 11.4–15.2)

## 2018-01-26 LAB — LACTATE DEHYDROGENASE: LDH: 144 U/L (ref 98–192)

## 2018-01-26 LAB — AMMONIA: Ammonia: 20 umol/L (ref 9–35)

## 2018-01-26 MED ORDER — LIDOCAINE HCL 1 % IJ SOLN
INTRAMUSCULAR | Status: AC
  Filled 2018-01-26: qty 20

## 2018-01-26 MED ORDER — TOLVAPTAN 15 MG PO TABS
15.0000 mg | ORAL_TABLET | Freq: Once | ORAL | Status: AC
  Administered 2018-01-26: 15 mg via ORAL
  Filled 2018-01-26: qty 1

## 2018-01-26 MED ORDER — WARFARIN SODIUM 5 MG PO TABS
9.0000 mg | ORAL_TABLET | Freq: Once | ORAL | Status: AC
  Administered 2018-01-26: 9 mg via ORAL
  Filled 2018-01-26: qty 1

## 2018-01-26 MED ORDER — LIDOCAINE HCL (PF) 1 % IJ SOLN
INTRAMUSCULAR | Status: DC | PRN
  Administered 2018-01-26: 10 mL

## 2018-01-26 NOTE — Progress Notes (Addendum)
Patient ID: Ronald Haydu., male   DOB: Apr 01, 1956, 62 y.o.   MRN: [id number]   Advanced Heart Failure VAD Team Note  PCP-Cardiologist: Glori Bickers, MD   Subjective:    Admitted with massive volume overload and disconnected dobutamine line. PICC line appeared cut.   Paracentesis 01/11/18 with 4.8 L off.  Paracentesis 01/16/18 with 2.4 liters off.   Unwitnessed fall on 4/16. CT of head negative.   Early this morning he pulled out tunneled cath. He says he has no idea what happened.   Today he is oriented and awake. Denies SOB. Denies CP.  No bleeding. No orthopnea or PND. Good appetite.   LVAD INTERROGATION:  HeartMate II LVAD:  Flow 5.5  liters/min, speed 5800, power 5  PI 2.5    Objective:    Vital Signs:   Temp:  [97.6 F (36.4 C)-98.4 F (36.9 C)] 98.4 F (36.9 C) (04/25 0700) Pulse Rate:  [70-82] 82 (04/25 0700) Resp:  [17-21] 18 (04/25 0700) BP: (74-116)/(57-94) 116/94 (04/25 0700) SpO2:  [93 %-98 %] 93 % (04/25 0700) Weight:  [190 lb 11.2 oz (86.5 kg)] 190 lb 11.2 oz (86.5 kg) (04/25 0500) Last BM Date: 01/25/18 Mean arterial Pressure 70s  Intake/Output:   Intake/Output Summary (Last 24 hours) at 01/26/2018 0747 Last data filed at 01/26/2018 0400 Gross per 24 hour  Intake 897 ml  Output 1350 ml  Net -453 ml      Physical Exam: GENERAL: Sitting on the side of the bed. No acute distress. HEENT: normal  NECK: Supple, JVP to jaw .  2+ bilaterally, no bruits.  No lymphadenopathy or thyromegaly appreciated.   CARDIAC:  Mechanical heart sounds with LVAD hum present. Tunneled cath site ok without bleeding or hematoma LUNGS:  Clear to auscultation bilaterally. No wheeze  ABDOMEN:  Soft, round, nontender, positive bowel sounds x4.     LVAD exit site:  Dressing dry and intact.  No erythema or drainage.  Stabilization device present and accurately applied.  Driveline dressing is being changed daily per sterile technique. EXTREMITIES:  Warm and dry, no cyanosis,  clubbing, rash or edema  NEUROLOGIC:  Alert and oriented. No aphasia.  No dysarthria.  Affect pleasant.       Telemetry   A sensed V paced 60-70s personally reviewed.    EKG    No new tracings.    Labs   Basic Metabolic Panel: Recent Labs  Lab 01/22/18 0428 01/23/18 0555 01/24/18 0435 01/25/18 0350 01/26/18 0417  NA 125* 122* 120* 124* 123*  K 3.8 3.0* 3.9 4.0 4.5  CL 78* 78* 80* 83* 84*  CO2 33* 30 30 32 30  GLUCOSE 157* 154* 183* 167* 204*  BUN 51* 54* 47* 46* 40*  CREATININE 1.76* 1.47* 1.29* 1.15 1.08  CALCIUM 9.2 8.9 8.5* 8.6* 8.4*    Liver Function Tests: No results for input(s): AST, ALT, ALKPHOS, BILITOT, PROT, ALBUMIN in the last 168 hours. No results for input(s): LIPASE, AMYLASE in the last 168 hours. Recent Labs  Lab 01/21/18 0500 01/22/18 0427  AMMONIA 63* 67*    CBC: Recent Labs  Lab 01/22/18 0428 01/23/18 0555 01/24/18 0435 01/25/18 0350 01/26/18 0417  WBC 6.4 6.7 8.1 7.8 8.1  HGB 11.1* 10.4* 9.9* 9.6* 9.7*  HCT 35.4* 33.0* 31.5* 31.1* 31.1*  MCV 76.8* 76.7* 77.2* 77.6* 78.3  PLT 207 187 175 164 173    INR: Recent Labs  Lab 01/22/18 0428 01/23/18 0555 01/24/18 0435 01/25/18 0350 01/26/18 9379  INR 1.96 1.95 1.96 1.91 1.81    Other results:    Imaging   No results found.   Medications:     Scheduled Medications: . amiodarone  200 mg Oral Daily  . citalopram  20 mg Oral Daily  . demeclocycline  150 mg Oral Q12H  . digoxin  0.125 mg Oral Daily  . docusate sodium  200 mg Oral QHS  . gabapentin  300 mg Oral BID  . insulin aspart  5 Units Subcutaneous TID WC  . insulin glargine  10 Units Subcutaneous Q2200  . lactulose  45 g Oral TID  . magnesium oxide  400 mg Oral Daily  . midodrine  10 mg Oral TID WC  . multivitamin with minerals  1 tablet Oral Daily  . pantoprazole  40 mg Oral BID  . potassium chloride  40 mEq Oral BID  . spironolactone  25 mg Oral Daily  . sucralfate  1 g Oral TID WC & HS  . torsemide  100  mg Oral BID  . warfarin  7.5 mg Oral ONCE-1800  . Warfarin - Pharmacist Dosing Inpatient   Does not apply q1800    Infusions: . DOBUTamine 7.5 mcg/kg/min (01/26/18 0400)    PRN Medications: acetaminophen, cyclobenzaprine, ondansetron (ZOFRAN) IV, ondansetron, senna-docusate, sodium chloride flush, traZODone   Patient Profile    Ronald Miller. is a 62 y.o. male  with history of chronic systolic HF s/p Medtronic ICD 2014, CAD s/p CABG in 2000, HTN, Hx of cocaine abuse, Tobacco abuse, Depression, PAF, and COPD. HM3 placed 09/09/2016.   Admitted from clinic with encephalopathy and acute on chronic CHF. Dobutamine disconnected sometime overnight.    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 Has Medtronic ICD. Admitted with volume overload and acute encephalopathy.  He pulled PICC line out this morning. IR Consulted to replace.  - Weight unchanged. Volume status stable.  - Continue torsemide 100 mg twice a day.   - . WIll not push further with RV failure.  - Creatinine stable 1.1.   - Continue dobutamine 7.5 mcg/kg/min (on chronically for RV failure). We will need [facility] to provide dobutamine at SNF. Carolynn Sayers with [facility].   - Continue midodrine 10 mg tid. - Not a good heart transplant candidate. Has reached end stage biventricular failure.  - Planning for SNF on d/c ([facility]) 2. CAD: Stable.  - No s/s of ischemia.    3. DMII:  - SSI 4. PAF:  - Maintaining NSR. Continue amio.   - on coumadin.   5. Anemia, iron deficiency: Had capsule endoscopy 04/2017 with severe gastritis. Continue carafate.  -hg 9.7  6. Anxiety/Depression: Continue celexa. Stable.  7. H/O RUE DVT: On coumadin.   - No change to current plan.   8. Chronic Anticoagulation: On coumadin, INR goal 1.8 - 2.4.  - INR 1.8.  Stable.  - Discussed dosing personally with Pharm-D.  9. Cardiac cirrhosis with ascites: Had Paracentesis on 4/10 with 4.8 L out.  S/P  Paracentesis 4/15with 2.4 liters removed.   10. Confusion/Hepatic Encephalopathy: NH3 has been high, we have increased lactulose.  - Continue higher dose of lactulose-->45 tid with improvement of ammonia and symptoms.  - Check ammonia today.  11. Hyponatremia: -Sodium 123 On demeclocycline 150 mg twice a day.  I have asked him to limit water and ice intake.   19. Fall- unwitnessed 4/16. CT of head negative.  - - No change to current plan.   13.  Hypokalemia- Resolved.    IR consult for new tunneled PICC. I have asked him to limit water/ice with low sodium.   To SNF once bed available/insurance approved. SW following.   I reviewed the LVAD parameters from today, and compared the results to the patient's prior recorded data.  No programming changes were made.  The LVAD is functioning within specified parameters.  The patient performs LVAD self-test daily.  LVAD interrogation was negative for any significant power changes, alarms or PI events/speed drops.  LVAD equipment check completed and is in good working order.  Back-up equipment present.   LVAD education done on emergency procedures and precautions and reviewed exit site care.   Length of Stay: [place], NP 01/26/2018, 7:47 AM  VAD Team --- VAD ISSUES ONLY--- Pager [phone] (7am - 7am) Advanced Heart Failure Team  Pager [phone] (M-F; 7a - 4p)  Please contact [facility] for night-coverage after hours (4p -7a ) and weekends on [email or web address]  Patient seen and examined with Darrick Grinder, NP. We discussed all aspects of the encounter. I agree with the assessment and plan as stated above.   He pulled out his tunneled PICC last night by accident and says he has no idea how it happened. We have discussed with IR and they will replace today. No bleeding. He did not fall. Volume status ok. Weight stable at 190. Dobutamine running peripherally. Sodium down to 123. Will give dose tolvaptan. Ammonia down to 20 on higher dose lactulose. INR 1.8.  Continue warfarin. Discussed dosing with PharmD personally. VAD interrogated personally. Parameters stable.  Glori Bickers, MD  9:25 PM

## 2018-01-26 NOTE — Progress Notes (Addendum)
Pt pulled out PICC line double lumen. RN put pressure at the site for ~45 min, new dressing applied, no bleeding or sweeling at the site. MD on call notified. Verified with pharmacy to run Dobutamine peripherally. Advised pt to lay flat in bed for an hour to prevent any complications. IV team consulted to assess pt IV site. Pt A/O, HR 68, O2 sats 93%.  22cm noted on picc line catether. Vaseline gauze dressing applied to the site.  Will continue to monitor pt.

## 2018-01-26 NOTE — Progress Notes (Signed)
[place] for Warfarin Indication: LVAD  Allergies  Allergen Reactions  . Chlorhexidine Gluconate Itching and Rash  . Codeine Nausea And Vomiting and Other (See Comments)    In high doses  . Lipitor [Atorvastatin] Nausea Only and Other (See Comments)    Nausea with high doses, tolerates 20mg  dose (08/22/16)  . Tape Other (See Comments)    Paper tape is ok   Patient Measurements: Height: 5\' 9"  (175.3 cm) Weight: 190 lb 11.2 oz (86.5 kg) IBW/kg (Calculated) : 70.7 Height: 5'9"  Vital Signs: Temp: 98.4 F (36.9 C) (04/25 0700) Temp Source: Oral (04/25 0700) BP: 116/94 (04/25 0700) Pulse Rate: 84 (04/25 0951)  Labs: Recent Labs    01/24/18 0435 01/25/18 0350 01/26/18 0417  HGB 9.9* 9.6* 9.7*  HCT 31.5* 31.1* 31.1*  PLT 175 164 173  LABPROT 22.2* 21.7* 20.8*  INR 1.96 1.91 1.81  CREATININE 1.29* 1.15 1.08   Estimated Creatinine Clearance: 78.2 mL/min (by C-G formula based on SCr of 1.08 mg/dL).  Assessment: 61 yoM on warfarin PTA for LVAD.  INR is therapeutic at 1.81 today. Hgb is 9.7, plts 173, LDH 144. No signs/symptoms of bleeding. Remains on home amiodarone.  PTA regimen: 7.5mg  on all days except 5mg  on Mondays and Fridays (admit INR 2.2)  Goal of Therapy:  INR 1.8-2.4 Monitor platelets by anticoagulation protocol: Yes   Plan:  Warfarin 9 mg PO x1 tonight >> can resume home regimen at discharge Daily INR and CBC Monitor for s/sx of bleeding  Julieta Bellini, PharmD student 01/26/2018

## 2018-01-26 NOTE — Progress Notes (Signed)
Left abdominal VAD dressing removed and site care performed using sterile technique. Drive line exit site cleaned with Chlora prep applicators x 2, rinsed with sterile saline, allowed to dry, and Sorbaview dressing with bio patch re-applied. NO SKIN PREP USED. Exit site healed and incorporated, the velour is fully implanted at exit site. No redness, tenderness, drainage, foul odor or rash noted. Drive line anchor re-applied.   Pt has 3 sets of his own battery clips and four batteries for discharge. He is on [place] [id number] and [place]. He will need these two items for transfer to [facility]. The VAD cart is to remain here at [facility]. Bedside nurse, [place], updated.

## 2018-01-26 NOTE — Progress Notes (Signed)
PT Cancellation Note  Patient Details Name: Ronald Miller. MRN: [id number] DOB: [birth year]   Cancelled Treatment:    Reason Eval/Treat Not Completed: Fatigue/lethargy limiting ability to participate. Will follow-up for PT treatment.  Mabeline Caras, PT, DPT Acute Rehab Services  Pager: [place] 01/26/2018, 3:28 PM

## 2018-01-26 NOTE — Procedures (Signed)
  Procedure: R IJ tunneled SL CVC to svc/ra jct   EBL:   minimal Complications:  none immediate  See full dictation in [facility].  Dillard Cannon MD Main # [phone] Pager  [phone]

## 2018-01-26 NOTE — Progress Notes (Addendum)
LVAD Coordinator Rounding Note:  Admitted 01/11/18 to Dr. Haroldine Laws due to acute encephalopathy and CHF with fluid overload. Home Dobutamine gtt disconnected.     HM III LVAD implanted on 09/09/16 by Dr. Cyndia Bent under Destination Therapy criteria.  Pt sitting up in chair this am. Nurse reports he pulled his PICC line out overnight. Asked pt what happened, he says he "doesn't know".  Vital signs: Temp:  98.2 HR: 60 Doppler Pressure: 74 Automatic BP:  116/94 O2 Sat: 90% on RA Wt: 220>222>213>208>212>216>198>187>185>183>187>190>190 lbs  LVAD interrogation reveals:  Speed: 5800 Flow: 5.5 Power: 4.6 w PI:  2.1 Alarms: none Events: 4 PI events Hematocrit:  31 Fixed speed:  5800 Low speed limit: 5500  Primary Controller: Replace back up battery in45months. Back up controller: Replace back up battery in13months.  Drive Line: left abdominal sorbaview dressing dry and intact; anchor intact and accurately applied. Change weekly; due [facility] April 25. Driveline anchored and secured.  Labs:  LDH trend: 159>136>140>141>132>132>137>139>136>127>124>134>144 INR trend: 2.2>2.21>2.82>3.04>1.83>1.91>1.97>1.82>2.01>1.92>1.96>1.95>1.91>1.81  Gtts: Dobutamine 7.5 mcg/kg/min  Anticoagulation Plan: -INR Goal:  1.8 - 2.3 -ASA Dose: no ASA (history of GI bleeding)  Device: -Protect; end of service 08/2016 -Therapies:  Off Last check: complete interrogation 05/26/2017. DDD (lower rate 60); BiV pacing with 96% AS-VP  Adverse Events on VAD: 11/30/16> Milrinone gtt at discharge 01/27/17>dobutamine at 5 mcg/kg/mingtt at discharge 03/08/17> RV failure 04/29/17> symptomatic anemia and melena. EGD revealed stomach ulcers. Carafate added; ASA stopped 05/31/17>Ongoing symptomatic anemia and melena; capsule study mild antral gastritis; poss tiny AVM right colon. Lower MAPS, Losartan cut back. 06/2017> admit with syncope, AMS, Dobutamine at 7.44mcg 11/09/17>Marked ascites, RV failure, and hepatic  encephalopathy. Paracentesis on 2/7 and 2/12 with >11 liters fluid removed 12/27/17>Outpatient paracentesis with 2.8 liters removed 01/11/18>IP paracentesis with 4.8 liters removed   Plan/Recommendations:  1. Will need weekly dressing changes; due today - Thur April 25. Bedside RN may change dressing. 2. Please call VAD pager with any VAD equipment or drive line issues. 3. Planned discharge to [facility]. Pt does not have any family that can bring home equipment. Will loan power module and Charity fundraiser for his use.   Zada Girt RN, VAD Coordiantor 24/7 VAD pager: [phone]

## 2018-01-26 NOTE — Progress Notes (Signed)
   Patient Status: [facility] - In-pt  Assessment and Plan: Patient in need of venous access.   Tunneled central cathter placement  ______________________________________________________________________   History of Present Illness: Dezmond Downie. is a 62 y.o. male   Heart failure LVAD Cirrhosis /ascites  Pulled out central line this am- inadvertently Request for replacement    Allergies and medications reviewed.   Review of Systems: A 12 point ROS discussed and pertinent positives are indicated in the HPI above.  All other systems are negative.   Vital Signs: BP (!) 116/94 (BP Location: Right Arm)   Pulse 82   Temp 98.4 F (36.9 C) (Oral)   Resp 18   Ht 5\' 9"  (1.753 m)   Wt 190 lb 11.2 oz (86.5 kg)   SpO2 93%   BMI 28.16 kg/m   Physical Exam  Constitutional: He is oriented to person, place, and time.  Cardiovascular: Regular rhythm.  Pulmonary/Chest: Effort normal and breath sounds normal.  Abdominal: He exhibits distension.  Musculoskeletal: Normal range of motion.  Neurological: He is alert and oriented to person, place, and time.  Skin: Skin is warm.  Psychiatric: He has a normal mood and affect. His behavior is normal. Judgment and thought content normal.  Nursing note and vitals reviewed.    Imaging reviewed.   Labs:  COAGS: Recent Labs    01/23/18 0555 01/24/18 0435 01/25/18 0350 01/26/18 0417  INR 1.95 1.96 1.91 1.81    BMP: Recent Labs    01/23/18 0555 01/24/18 0435 01/25/18 0350 01/26/18 0417  NA 122* 120* 124* 123*  K 3.0* 3.9 4.0 4.5  CL 78* 80* 83* 84*  CO2 30 30 32 30  GLUCOSE 154* 183* 167* 204*  BUN 54* 47* 46* 40*  CALCIUM 8.9 8.5* 8.6* 8.4*  CREATININE 1.47* 1.29* 1.15 1.08  GFRNONAA 50* 58* >60 >60  GFRAA 58* >60 >60 >60       Electronically Signed: Isidoro Santillana A, PA-C 01/26/2018, 8:48 AM   I spent a total of 15 minutes in face to face in clinical consultation, greater than 50% of which was  counseling/coordinating care for venous access.Patient ID: Makena Mcgrady., male   DOB: 07-15-1956, 62 y.o.   MRN: [id number]

## 2018-01-26 NOTE — Clinical Social Work Note (Signed)
Authorization approved for patient to discharge to [facility]: [id number] rug level RVA. Authorization is good for 48 hours.  Dayton Scrape, [place]

## 2018-01-26 NOTE — Progress Notes (Signed)
Transported to IR by wheelchair for central line insertion. Back to the unit at 3:10  Pm

## 2018-01-27 LAB — CBC
HCT: 31.6 % — ABNORMAL LOW (ref 39.0–52.0)
Hemoglobin: 9.8 g/dL — ABNORMAL LOW (ref 13.0–17.0)
MCH: 24.3 pg — ABNORMAL LOW (ref 26.0–34.0)
MCHC: 31 g/dL (ref 30.0–36.0)
MCV: 78.2 fL (ref 78.0–100.0)
Platelets: 164 10*3/uL (ref 150–400)
RBC: 4.04 MIL/uL — ABNORMAL LOW (ref 4.22–5.81)
RDW: 18.7 % — ABNORMAL HIGH (ref 11.5–15.5)
WBC: 6.1 10*3/uL (ref 4.0–10.5)

## 2018-01-27 LAB — GLUCOSE, CAPILLARY
GLUCOSE-CAPILLARY: 190 mg/dL — AB (ref 65–99)
Glucose-Capillary: 211 mg/dL — ABNORMAL HIGH (ref 65–99)
Glucose-Capillary: 183 mg/dL — ABNORMAL HIGH (ref 65–99)

## 2018-01-27 LAB — PROTIME-INR
INR: 1.83
Prothrombin Time: 21 seconds — ABNORMAL HIGH (ref 11.4–15.2)

## 2018-01-27 LAB — BASIC METABOLIC PANEL
Anion gap: 9 (ref 5–15)
BUN: 35 mg/dL — AB (ref 6–20)
CO2: 30 mmol/L (ref 22–32)
CREATININE: 1.07 mg/dL (ref 0.61–1.24)
Calcium: 8.6 mg/dL — ABNORMAL LOW (ref 8.9–10.3)
Chloride: 87 mmol/L — ABNORMAL LOW (ref 101–111)
GFR calc Af Amer: >60 mL/min (ref >60)
GFR calc non Af Amer: >60 mL/min (ref >60)
GLUCOSE: 187 mg/dL — AB (ref 65–99)
Potassium: 4.8 mmol/L (ref 3.5–5.1)
Sodium: 126 mmol/L — ABNORMAL LOW (ref 135–145)

## 2018-01-27 LAB — LACTATE DEHYDROGENASE: LDH: 162 U/L (ref 98–192)

## 2018-01-27 IMAGING — DX DG CHEST 2V
2 series · 2 of 2 positions shown · non-contrast
Comparison: 09/04/2016.

CLINICAL DATA: CHF.

EXAM:
CHEST  2 VIEW

[chest pa]
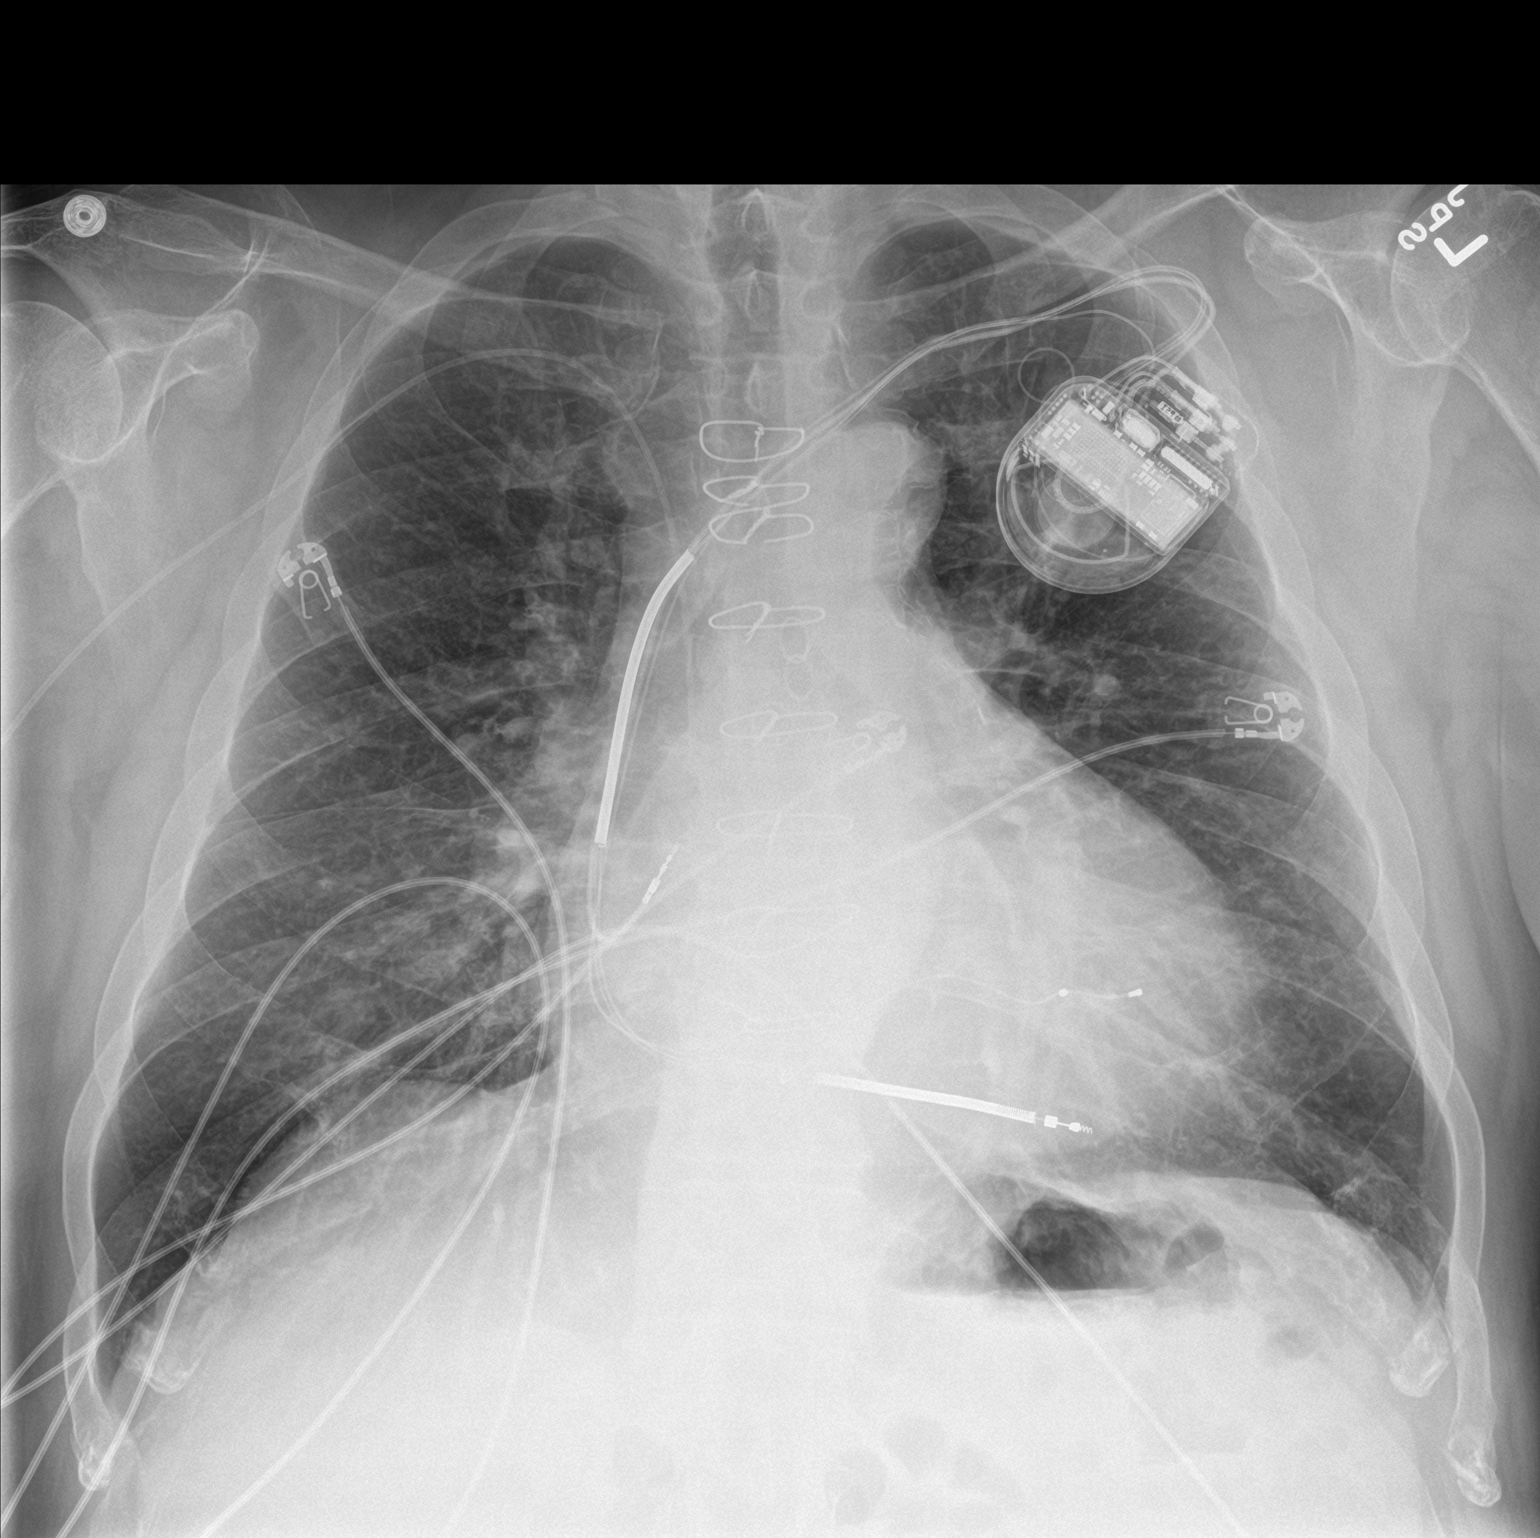

[chest lat]
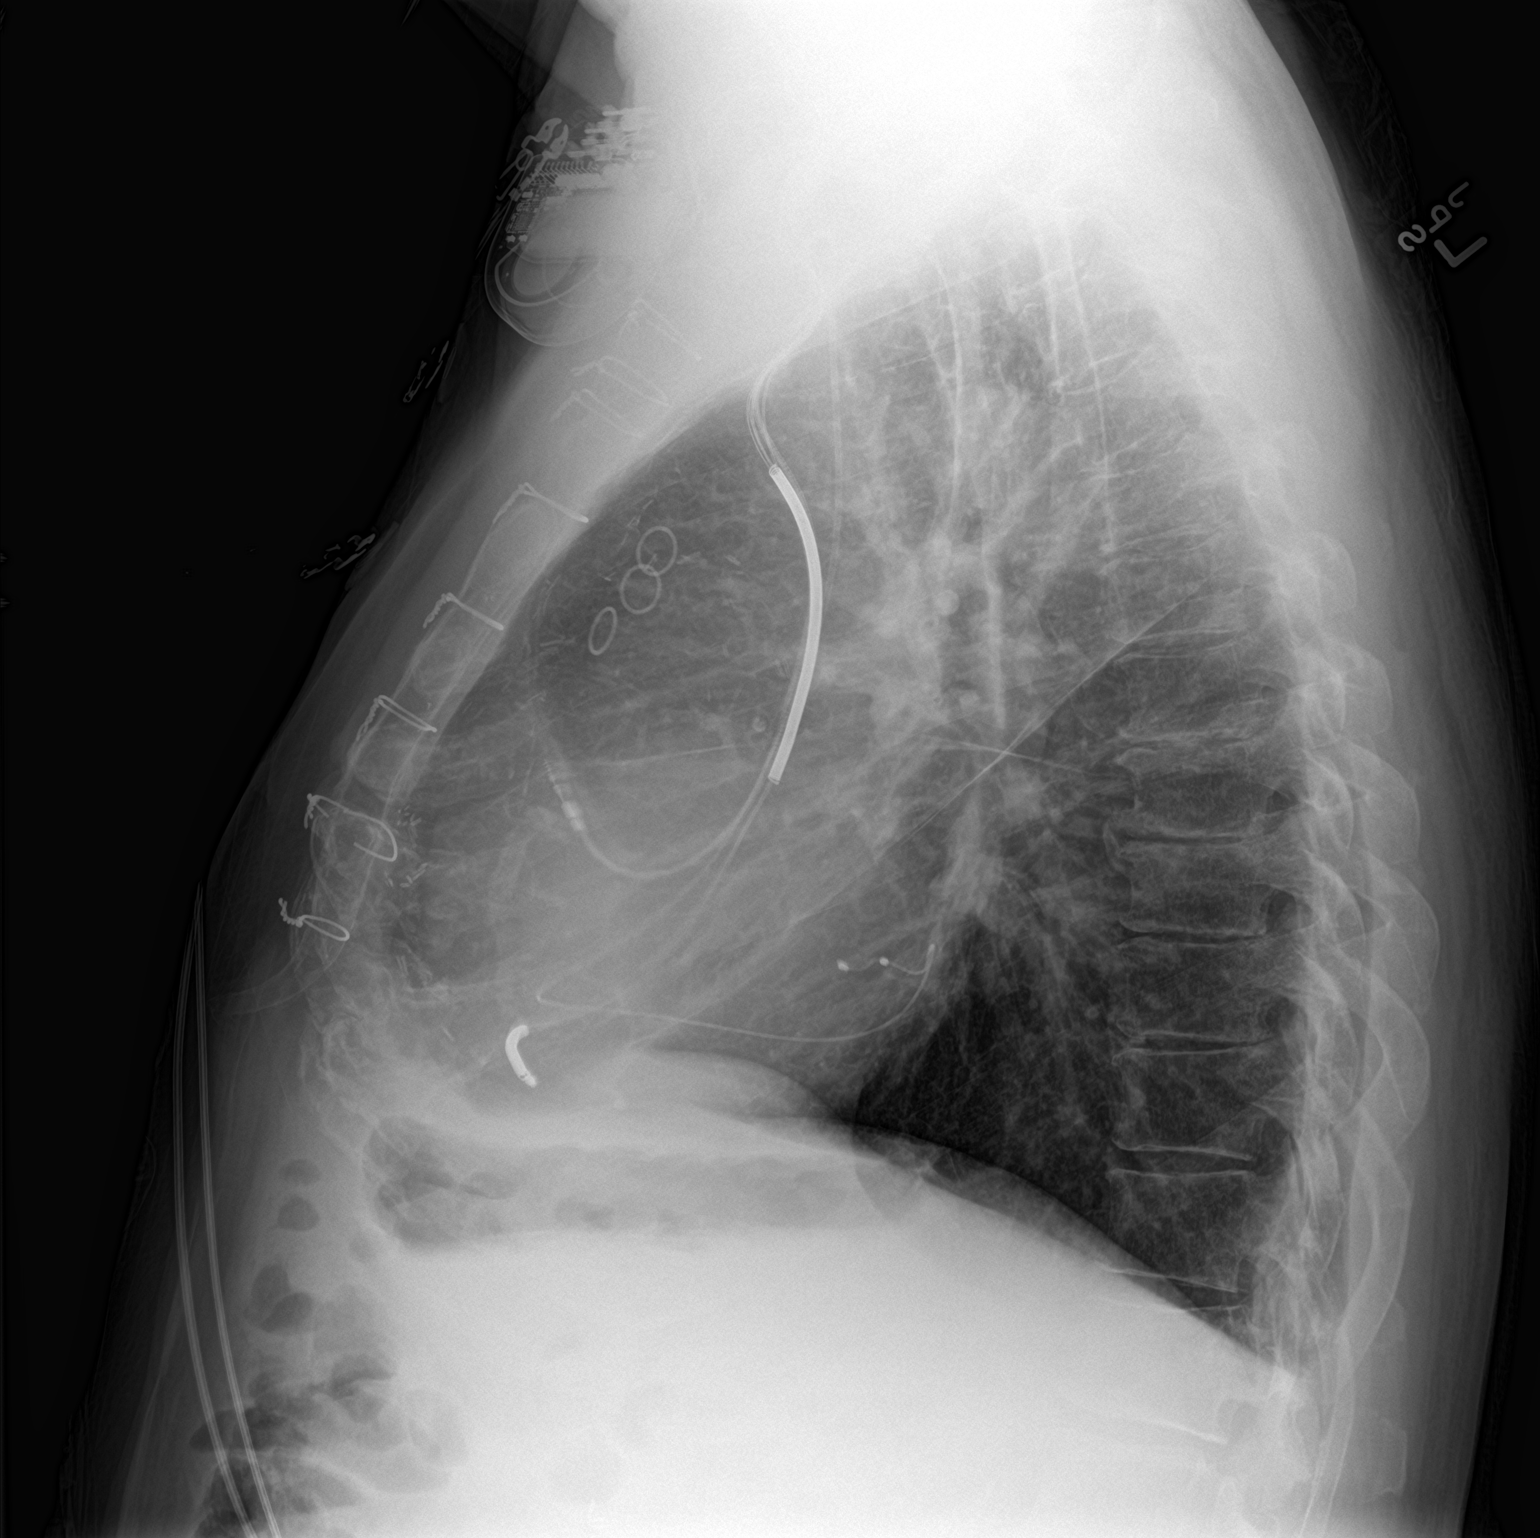

[2 of 2 positions shown; findings below may reference images not displayed]

FINDINGS: Cardiac pacer with lead tip over the right atrium right ventricle.
AICD noted. Right PICC line noted in stable position. Stable
cardiomegaly. Low lung volumes.
IMPRESSION: 1. Right PICC line in stable position.

2.  AICD in stable position.  Prior CABG.  Stable cardiomegaly.

3. Low lung volumes with mild basilar atelectasis.

## 2018-01-27 MED ORDER — LACTULOSE 10 GM/15ML PO SOLN: 45.0000 g | Freq: Three times a day (TID) | ORAL | 6 refills | Status: AC

## 2018-01-27 MED ORDER — POTASSIUM CHLORIDE CRYS ER 20 MEQ PO TBCR
20.0000 meq | EXTENDED_RELEASE_TABLET | Freq: Two times a day (BID) | ORAL | Status: DC
  Administered 2018-01-27: 20 meq via ORAL
  Filled 2018-01-27: qty 1

## 2018-01-27 MED ORDER — WARFARIN SODIUM 5 MG PO TABS
5.0000 mg | ORAL_TABLET | Freq: Once | ORAL | Status: AC
  Administered 2018-01-27: 5 mg via ORAL
  Filled 2018-01-27: qty 1

## 2018-01-27 MED ORDER — SPIRONOLACTONE 25 MG PO TABS: 25.0000 mg | ORAL_TABLET | Freq: Every day | ORAL | 6 refills | Status: AC

## 2018-01-27 MED ORDER — TORSEMIDE 100 MG PO TABS: 100.0000 mg | ORAL_TABLET | Freq: Two times a day (BID) | ORAL | 6 refills | Status: DC

## 2018-01-27 NOTE — Progress Notes (Signed)
Pt discharge to [facility] via PTA with dobutamine infusion. All VAD equipment send with pt. Pt placed on battery pack with no issues, driveline anchored and secured.  Discharge instructions given to pt. All questions answered.

## 2018-01-27 NOTE — Care Management Note (Signed)
Case Management Note  Patient Details  Name: Ronald Miller. MRN: [id number] Date of Birth: [birth year]  Subjective/Objective:     Pt admitted with fluid overload - pt is a home dobutamine pt and drip was disconnected                Action/Plan:  PTA independent from home active with [facility] for [facility] and dobutamine infusion - [facility] will resume services at discharge   Expected Discharge Date:  01/27/18               Expected Discharge Plan:  [place]  In-House Referral:     Discharge planning Services  CM Consult  Post Acute Care Choice:    Choice offered to:  Patient  DME Arranged:    DME Agency:  [place]:  RN [facility] Agency:     Status of Service:     If discussed at [employer], dates discussed:    Additional Comments: 01/27/2018  Pt to discharge to SNF today facilitated by CSW  01/17/18 Pt remains on IV dobutamine and IV lasix.  Per bedside nurse pt is really deconditioned today - CM ordered PT eval.   Maryclare Labrador, RN 01/27/2018, 3:06 PM

## 2018-01-27 NOTE — Clinical Social Work Note (Signed)
Patient has orders to discharge [facility] today. Received call from Carolynn Sayers with [facility] this morning. She will be here around 12:00 to set up patient's dobutamine prior to discharge.   Dayton Scrape, [place]

## 2018-01-27 NOTE — Clinical Social Work Note (Signed)
CSW facilitated patient discharge including contacting patient family and facility to confirm patient discharge plans. Clinical information faxed to facility and family agreeable with plan. CSW arranged ambulance transport via [facility] to [facility]. RN has already called report.  CSW will sign off for now as social work intervention is no longer needed. Please consult [place] again if new needs arise.  Ronald Miller, [place]

## 2018-01-27 NOTE — Progress Notes (Signed)
OT Cancellation Note  Patient Details Name: Ronald Miller. MRN: [id number] DOB: [birth year]   Cancelled Treatment:    Reason Eval/Treat Not Completed: Patient declined, no reason specified. [place] 01/27/2018, 2:11 PM  Hulda Humphrey OTR/L [phone]

## 2018-01-27 NOTE — Clinical Social Work Placement (Signed)
   CLINICAL SOCIAL WORK PLACEMENT  NOTE  Date:  01/27/2018  Patient Details  Name: Ronald Miller. MRN: [id number] Date of Birth: 12/09/1955  Clinical Social Work is seeking post-discharge placement for this patient at the [place] level of care (*CSW will initial, date and re-position this form in  chart as items are completed):  Yes   Patient/family provided with [place] Work Department's list of facilities offering this level of care within the geographic area requested by the patient (or if unable, by the patient's family).  Yes   Patient/family informed of their freedom to choose among providers that offer the needed level of care, that participate in Medicare, Medicaid or managed care program needed by the patient, have an available bed and are willing to accept the patient.  Yes   Patient/family informed of [facility]'s ownership interest in [facility] and [facility], as well as of the fact that they are under no obligation to receive care at these facilities.  PASRR submitted to EDS on 01/24/18     PASRR number received on       Existing PASRR number confirmed on 01/24/18     FL2 transmitted to all facilities in geographic area requested by pt/family on 01/24/18     FL2 transmitted to all facilities within larger geographic area on       Patient informed that his/her managed care company has contracts with or will negotiate with certain facilities, including the following:        Yes   Patient/family informed of bed offers received.  Patient chooses bed at [facility]     Physician recommends and patient chooses bed at      Patient to be transferred to [facility] on 01/27/18.  Patient to be transferred to facility by [facility]     Patient family notified on 01/27/18 of transfer.  Name of family member notified:  Iantha Fallen     PHYSICIAN       Additional Comment:     _______________________________________________ Candie Chroman, LCSW 01/27/2018, 3:29 PM

## 2018-01-27 NOTE — Progress Notes (Signed)
LVAD Coordinator Rounding Note:  Admitted 01/11/18 to Dr. Haroldine Laws due to acute encephalopathy and CHF with fluid overload. Home Dobutamine gtt disconnected.     HM III LVAD implanted on 09/09/16 by Dr. Cyndia Bent under Destination Therapy criteria.  Pt sitting up in chair this am. Patient says he is ready to go to Rehab today.  Vital signs: Temp:  98 HR: 66 Doppler Pressure:  Automatic BP:  O2 Sat: 95 % on RA Wt: 220>222>213>208>212>216>198>187>185>183>187>190>190>189 lbs  LVAD interrogation reveals:  Speed: 5800 Flow: 5.5 Power: 4.6 w PI:  2.2 Alarms: none Events: rare PI events Hematocrit:  31 Fixed speed:  5800 Low speed limit: 5500  Primary Controller: Replace back up battery in22months. Back up controller: Replace back up battery in58months.  Drive Line: left abdominal sorbaview dressing dry and intact; anchor intact and accurately applied. Change weekly; due [facility] April 25. Driveline anchored and secured.  Labs:  LDH trend: 159>136>140>141>132>132>137>139>136>127>124>134>144>162  INR trend: 1.91>1.97>1.82>2.01>1.92>1.96>1.95>1.91>1.81>1.83  Gtts: Dobutamine 7.5 mcg/kg/min  Anticoagulation Plan: -INR Goal:  1.8 - 2.3 -ASA Dose: no ASA (history of GI bleeding)  Device: -Protect; end of service 08/2016 -Therapies:  Off Last check: complete interrogation 05/26/2017. DDD (lower rate 60); BiV pacing with 96% AS-VP  Adverse Events on VAD: 11/30/16> Milrinone gtt at discharge 01/27/17>dobutamine at 5 mcg/kg/mingtt at discharge 03/08/17> RV failure 04/29/17> symptomatic anemia and melena. EGD revealed stomach ulcers. Carafate added; ASA stopped 05/31/17>Ongoing symptomatic anemia and melena; capsule study mild antral gastritis; poss tiny AVM right colon. Lower MAPS, Losartan cut back. 06/2017> admit with syncope, AMS, Dobutamine at 7.79mcg 11/09/17>Marked ascites, RV failure, and hepatic encephalopathy. Paracentesis on 2/7 and 2/12 with >11 liters fluid  removed 12/27/17>Outpatient paracentesis with 2.8 liters removed 01/11/18>IP paracentesis with 4.8 liters removed   Plan/Recommendations:  1. Will need weekly dressing changes; due today - Thur April 25. Bedside RN may change dressing. 2. Please call VAD pager with any VAD equipment or drive line issues. 3. Planned discharge to [facility]. Pt does not have any family that can bring home equipment. Will loan power module and Charity fundraiser for his use.  [id number]  Zada Girt RN, VAD Coordiantor 24/7 VAD pager: [phone]

## 2018-01-27 NOTE — Plan of Care (Signed)
CONTINUE CURRENT CARE PLAN 

## 2018-01-27 NOTE — Progress Notes (Signed)
Physical Therapy Treatment Patient Details Name: Ronald Miller. MRN: [id number] DOB: Aug 24, 1956 Today's Date: 01/27/2018    History of Present Illness Pt is a 62 y.o. male admitted 01/10/18 with encephalopathy, volume overload, acute CHF with dobutamine line disconnected. S/p paracentesis 4/10 and 4/15; unwitnessed fall OOB occurred 4/16. S/p RIJ tunnelled cath placement 4/25. PMH includes chronic systolic HF s/p Medtronic ICD 2014, CAD (s/p CABG 2000), HTN, cocaine abuse, tobacco abuse, depression, PAF, COPD; LVAD HM3 placed 09/09/2016.    PT Comments    Pt initially resistant to walking but willing to walk if returning to chair for breakfast. Pt aware of need to walk throughout the day but states he will refuse any further moving around. Pt with improved cognition and able to transition from power to battery with min cues and increased time, visibly frustrated with power transition and lack of comprehension for sequence, total assist to transition back to main power. Will continue to follow and recommend daily walking with nursing.   HR 67 with gait    Follow Up Recommendations  Supervision/Assistance - 24 hour;SNF     Equipment Recommendations  None recommended by PT    Recommendations for Other Services       Precautions / Restrictions Precautions Precautions: Fall Precaution Comments: LVAD    Mobility  Bed Mobility               General bed mobility comments: EOB on arrival  Transfers     Transfers: Sit to/from Stand Sit to Stand: Min guard         General transfer comment: guarding for safety with cues for hand placement  Ambulation/Gait Ambulation/Gait assistance: Min guard Ambulation Distance (Feet): 280 Feet Assistive device: Rolling walker (2 wheeled) Gait Pattern/deviations: Step-through pattern;Decreased stride length;Trunk flexed   Gait velocity interpretation: <1.8 ft/sec, indicate of risk for recurrent falls General Gait Details: pt with  increased gait tolerance with continued reliance on RW and cues for looking up and progression as well as directional cues   Stairs             Wheelchair Mobility    Modified Rankin (Stroke Patients Only)       Balance Overall balance assessment: Needs assistance   Sitting balance-Leahy Scale: Good       Standing balance-Leahy Scale: Fair                              Cognition Arousal/Alertness: Awake/alert Behavior During Therapy: Flat affect Overall Cognitive Status: Impaired/Different from baseline Area of Impairment: Memory;Attention;Following commands;Safety/judgement;Problem solving;Orientation                 Orientation Level: Time Current Attention Level: Selective Memory: Decreased short-term memory Following Commands: Follows one step commands with increased time Safety/Judgement: Decreased awareness of deficits;Decreased awareness of safety   Problem Solving: Slow processing;Decreased initiation;Difficulty sequencing;Requires verbal cues;Requires tactile cues General Comments: pt continues to need min-mod cues and assist for power transition and unaware of sequence or mistakes      Exercises      General Comments        Pertinent Vitals/Pain Pain Assessment: No/denies pain    Home Living                      Prior Function            PT Goals (current goals can now be found in the care plan  section) Progress towards PT goals: Progressing toward goals    Frequency    Min 2X/week      PT Plan Current plan remains appropriate;Frequency needs to be updated    Co-evaluation              AM-PAC PT "6 Clicks" Daily Activity  Outcome Measure  Difficulty turning over in bed (including adjusting bedclothes, sheets and blankets)?: None Difficulty moving from lying on back to sitting on the side of the bed? : None Difficulty sitting down on and standing up from a chair with arms (e.g., wheelchair,  bedside commode, etc,.)?: A Little Help needed moving to and from a bed to chair (including a wheelchair)?: A Little Help needed walking in hospital room?: A Little Help needed climbing 3-5 steps with a railing? : A Little 6 Click Score: 20    End of Session Equipment Utilized During Treatment: Gait belt Activity Tolerance: Patient tolerated treatment well Patient left: in chair;with call bell/phone within reach;with chair alarm set Nurse Communication: Mobility status;Precautions PT Visit Diagnosis: Other abnormalities of gait and mobility (R26.89);Muscle weakness (generalized) (M62.81);Unsteadiness on feet (R26.81)     Time: 5102-5852 PT Time Calculation (min) (ACUTE ONLY): 29 min  Charges:  $Gait Training: 8-22 mins $Therapeutic Activity: 8-22 mins                    G Codes:       Elwyn Reach, PT [phone]    [place] 01/27/2018, 11:34 AM

## 2018-01-27 NOTE — Progress Notes (Signed)
Report called to [facility], explained and discussed discharge instructions, called Terrie to notify of patient going to [facility]. Follow up appointment given to Mr. Valda Lamb and also placed discharge instructions in packet to be given to [facility] nurse.

## 2018-01-27 NOTE — Progress Notes (Signed)
[facility] for Warfarin Indication: LVAD  Allergies  Allergen Reactions  . Chlorhexidine Gluconate Itching and Rash  . Codeine Nausea And Vomiting and Other (See Comments)    In high doses  . Lipitor [Atorvastatin] Nausea Only and Other (See Comments)    Nausea with high doses, tolerates 20mg  dose (08/22/16)  . Tape Other (See Comments)    Paper tape is ok   Patient Measurements: Height: 5\' 9"  (175.3 cm) Weight: 189 lb 6.4 oz (85.9 kg) IBW/kg (Calculated) : 70.7 Height: 5'9"  Vital Signs: Temp: 98.2 F (36.8 C) (04/26 0738) Temp Source: Oral (04/26 0738) BP: 100/89 (04/26 0738) Pulse Rate: 52 (04/26 0738)  Labs: Recent Labs    01/25/18 0350 01/26/18 0417 01/27/18 0350  HGB 9.6* 9.7* 9.8*  HCT 31.1* 31.1* 31.6*  PLT 164 173 164  LABPROT 21.7* 20.8* 21.0*  INR 1.91 1.81 1.83  CREATININE 1.15 1.08 1.07   Estimated Creatinine Clearance: 78.8 mL/min (by C-G formula based on SCr of 1.07 mg/dL).  Assessment: Ronald Miller on warfarin PTA for LVAD.  INR is therapeutic at 1.81 today. Hgb is 9.7, plts 173, LDH 160. No signs/symptoms of bleeding. Remains on home amiodarone.  PTA regimen: 7.5mg  on all days except 5mg  on Mondays and Fridays (admit INR 2.2)  Goal of Therapy:  INR 1.8-2.4 Monitor platelets by anticoagulation protocol: Yes   Plan:  Resume warfarin 7.5mg  daily except 5mg  MF at DC today to SNF Daily INR and CBC Monitor for s/sx of bleeding  Bonnita Nasuti Pharm.D. CPP, BCPS Clinical Pharmacist [phone] 01/27/2018 10:28 AM

## 2018-01-27 NOTE — Progress Notes (Addendum)
Patient ID: Ronald Miller., male   DOB: August 10, 1956, 62 y.o.   MRN: [id number]   Advanced Heart Failure VAD Team Note  PCP-Cardiologist: Glori Bickers, MD   Subjective:    Admitted with massive volume overload and disconnected dobutamine line. PICC line appeared cut.   Paracentesis 01/11/18 with 4.8 L off.  Paracentesis 01/16/18 with 2.4 liters off.   Unwitnessed fall on 4/16. CT of head negative.   Yesterday PICC line replaced. Received dose of tolvaptan.   Feeling much better. Denies SOB. Edema improved. No asterixis   LVAD INTERROGATION:  HeartMate II LVAD:  Flow 5.6   liters/min, speed 5800, power 5  PI 2.2     Objective:    Vital Signs:   Temp:  [96.8 F (36 C)-98.2 F (36.8 C)] 98.2 F (36.8 C) (04/26 0700) Pulse Rate:  [52-159] 52 (04/26 0700) Resp:  [14-18] 17 (04/26 0700) BP: (93-113)/(60-89) 100/89 (04/26 0700) SpO2:  [90 %-100 %] 91 % (04/26 0700) Weight:  [189 lb 6.4 oz (85.9 kg)] 189 lb 6.4 oz (85.9 kg) (04/26 0341) Last BM Date: 01/25/18 Mean arterial Pressure 80s  Intake/Output:   Intake/Output Summary (Last 24 hours) at 01/27/2018 0754 Last data filed at 01/27/2018 0600 Gross per 24 hour  Intake 1003 ml  Output 3275 ml  Net -2272 ml       Physical Exam: GENERAL: NAD. Sitting on the side of the bed.  HEENT: normal  NECK: Supple, JVP 11-12  .  2+ bilaterally, no bruits.  No lymphadenopathy or thyromegaly appreciated.   CARDIAC:  Mechanical heart sounds with LVAD hum present. R upper chest tunneled PICC LUNGS:  Clear to auscultation bilaterally. No wheeze ABDOMEN:  Soft, round, nontender, positive bowel sounds x4.     LVAD exit site:   Dressing dry and intact.  No erythema or drainage.  Stabilization device present and accurately applied.  Driveline dressing is being changed daily per sterile technique. EXTREMITIES:  Warm and dry, no cyanosis, clubbing, rash or edema  NEUROLOGIC:  Alert and oriented x 4.  Gait steady.  No aphasia.  No dysarthria.   Affect pleasant.     Telemetry    A sensed V paced 70s personally reviewed.    EKG    No new tracings.    Labs   Basic Metabolic Panel: Recent Labs  Lab 01/23/18 0555 01/24/18 0435 01/25/18 0350 01/26/18 0417 01/27/18 0350  NA 122* 120* 124* 123* 126*  K 3.0* 3.9 4.0 4.5 4.8  CL 78* 80* 83* 84* 87*  CO2 30 30 32 30 30  GLUCOSE 154* 183* 167* 204* 187*  BUN 54* 47* 46* 40* 35*  CREATININE 1.47* 1.29* 1.15 1.08 1.07  CALCIUM 8.9 8.5* 8.6* 8.4* 8.6*    Liver Function Tests: No results for input(s): AST, ALT, ALKPHOS, BILITOT, PROT, ALBUMIN in the last 168 hours. No results for input(s): LIPASE, AMYLASE in the last 168 hours. Recent Labs  Lab 01/21/18 0500 01/22/18 0427 01/26/18 0935  AMMONIA 63* 67* 20    CBC: Recent Labs  Lab 01/23/18 0555 01/24/18 0435 01/25/18 0350 01/26/18 0417 01/27/18 0350  WBC 6.7 8.1 7.8 8.1 6.1  HGB 10.4* 9.9* 9.6* 9.7* 9.8*  HCT 33.0* 31.5* 31.1* 31.1* 31.6*  MCV 76.7* 77.2* 77.6* 78.3 78.2  PLT 187 175 164 173 164    INR: Recent Labs  Lab 01/23/18 0555 01/24/18 0435 01/25/18 0350 01/26/18 0417 01/27/18 0350  INR 1.95 1.96 1.91 1.81 1.83    Other results:  Imaging   Ir Fluoro Guide Cv Line Right  Result Date: 01/26/2018 CLINICAL DATA:  Heart failure, needs durable venous access for dobutamine infusion EXAM: TUNNELED CENTRAL VENOUS CATHETER PLACEMENT WITH ULTRASOUND AND FLUOROSCOPIC GUIDANCE TECHNIQUE: The procedure, risks, benefits, and alternatives were explained to the patient. Questions regarding the procedure were encouraged and answered. The patient understands and consents to the procedure. Patency of the right IJ vein was confirmed with ultrasound with image documentation. An appropriate skin site was determined. Region was prepped using maximum barrier technique including cap and mask, sterile gown, sterile gloves, large sterile sheet, and Chlorhexidine as cutaneous antisepsis. The region was infiltrated  locally with 1% lidocaine. Under real-time ultrasound guidance, the right IJ vein was accessed with a 21 gauge micropuncture needle; the needle tip within the vein was confirmed with ultrasound image documentation. 1F single lumen cuffed powerPICC tunneled from a right anterior chest wall approach to the dermatotomy site. Needle exchanged over the 018 guidewire for transitional dilator, through which the catheter which had been cut to 23 cm was advanced under intermittent fluoroscopy, positioned with its tip at the cavoatrial junction. Spot chest radiograph confirms good catheter position. No pneumothorax. Catheter was flushed per protocol. Catheter secured externally with O Prolene suture. The right IJ dermatotomy site was closed with Dermabond. COMPLICATIONS: COMPLICATIONS None immediate FLUOROSCOPY TIME:  12 seconds, 2 mGy COMPARISON:  None IMPRESSION: 1. Technically successful placement of tunneled right IJ tunneled single lumen power injectable catheter with ultrasound and fluoroscopic guidance. Ready for routine use. Electronically Signed   By: Lucrezia Europe M.D.   On: 01/26/2018 15:44   Ir US Guide Vasc Access Right  Result Date: 01/26/2018 CLINICAL DATA:  Heart failure, needs durable venous access for dobutamine infusion EXAM: TUNNELED CENTRAL VENOUS CATHETER PLACEMENT WITH ULTRASOUND AND FLUOROSCOPIC GUIDANCE TECHNIQUE: The procedure, risks, benefits, and alternatives were explained to the patient. Questions regarding the procedure were encouraged and answered. The patient understands and consents to the procedure. Patency of the right IJ vein was confirmed with ultrasound with image documentation. An appropriate skin site was determined. Region was prepped using maximum barrier technique including cap and mask, sterile gown, sterile gloves, large sterile sheet, and Chlorhexidine as cutaneous antisepsis. The region was infiltrated locally with 1% lidocaine. Under real-time ultrasound guidance, the right IJ  vein was accessed with a 21 gauge micropuncture needle; the needle tip within the vein was confirmed with ultrasound image documentation. 1F single lumen cuffed powerPICC tunneled from a right anterior chest wall approach to the dermatotomy site. Needle exchanged over the 018 guidewire for transitional dilator, through which the catheter which had been cut to 23 cm was advanced under intermittent fluoroscopy, positioned with its tip at the cavoatrial junction. Spot chest radiograph confirms good catheter position. No pneumothorax. Catheter was flushed per protocol. Catheter secured externally with O Prolene suture. The right IJ dermatotomy site was closed with Dermabond. COMPLICATIONS: COMPLICATIONS None immediate FLUOROSCOPY TIME:  12 seconds, 2 mGy COMPARISON:  None IMPRESSION: 1. Technically successful placement of tunneled right IJ tunneled single lumen power injectable catheter with ultrasound and fluoroscopic guidance. Ready for routine use. Electronically Signed   By: Lucrezia Europe M.D.   On: 01/26/2018 15:44     Medications:     Scheduled Medications: . amiodarone  200 mg Oral Daily  . citalopram  20 mg Oral Daily  . digoxin  0.125 mg Oral Daily  . docusate sodium  200 mg Oral QHS  . gabapentin  300 mg Oral BID  .  insulin aspart  5 Units Subcutaneous TID WC  . insulin glargine  10 Units Subcutaneous Q2200  . lactulose  45 g Oral TID  . magnesium oxide  400 mg Oral Daily  . midodrine  10 mg Oral TID WC  . multivitamin with minerals  1 tablet Oral Daily  . pantoprazole  40 mg Oral BID  . potassium chloride  40 mEq Oral BID  . spironolactone  25 mg Oral Daily  . sucralfate  1 g Oral TID WC & HS  . torsemide  100 mg Oral BID  . Warfarin - Pharmacist Dosing Inpatient   Does not apply q1800    Infusions: . DOBUTamine 7.5 mcg/kg/min (01/27/18 0647)    PRN Medications: acetaminophen, cyclobenzaprine, lidocaine (PF), ondansetron (ZOFRAN) IV, ondansetron, senna-docusate, sodium chloride  flush, traZODone   Patient Profile    Ronald Miller. is a 62 y.o. male  with history of chronic systolic HF s/p Medtronic ICD 2014, CAD s/p CABG in 2000, HTN, Hx of cocaine abuse, Tobacco abuse, Depression, PAF, and COPD. HM3 placed 09/09/2016.   Admitted from clinic with encephalopathy and acute on chronic CHF. Dobutamine disconnected sometime overnight.    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 Has Medtronic ICD. Admitted with volume overload and acute encephalopathy.  IR replaced tunneled PICC on 4/25.  - Continue torsemide 100 mg twice a day.   - . WIll not push further with RV failure.  -Renal function stable.  - Continue dobutamine 7.5 mcg/kg/min (on chronically for RV failure). We will need [facility] to provide dobutamine at SNF. Carolynn Sayers with [facility] aware.  - Continue midodrine 10 mg tid. - Not a good heart transplant candidate. Has reached end stage biventricular failure.  - Planning for SNF on d/c ([facility]) 2. CAD: Stable.  - No s/s of ischemia.    3. DMII:  - SSI 4. PAF:  -Maintaining NSR. Continue amio.   - on coumadin.   5. Anemia, iron deficiency: Had capsule endoscopy 04/2017 with severe gastritis. Continue carafate.  -Stable 9.8.  6. Anxiety/Depression: Continue celexa. Stable.  7. H/O RUE DVT: On coumadin.   - No change to current plan.   8. Chronic Anticoagulation: On coumadin, INR goal 1.8 - 2.4.  - INR 1.8, stable. .  - Discussed dosing personally with Pharm-D.  9. Cardiac cirrhosis with ascites: Had Paracentesis on 4/10 with 4.8 L and 4/15 2.4 liters removed.   10. Confusion/Hepatic Encephalopathy: NH3 has been high, we have increased lactulose. - Ammonia down to 20 on 4/25  - Continue higher dose of lactulose-->45 tid with improvement of ammonia and symptoms.  11. Hyponatremia: -Sodium trending up to 126. Received dose of tolvaptan. I have asked him to limit water and ice intake.   53. Fall-  unwitnessed 4/16. CT of head negative.  - - No change to current plan.   13. Hypokalemia- Resolved.    To SNF once bed available/insurance approved. SW following.   Looks great. Should be able to go to [facility] today.    I reviewed the LVAD parameters from today, and compared the results to the patient's prior recorded data.  No programming changes were made.  The LVAD is functioning within specified parameters.  The patient performs LVAD self-test daily.  LVAD interrogation was negative for any significant power changes, alarms or PI events/speed drops.  LVAD equipment check completed and is in good working order.  Back-up equipment present.   LVAD education done on  emergency procedures and precautions and reviewed exit site care.   Length of Stay: [place], NP 01/27/2018, 7:54 AM  VAD Team --- VAD ISSUES ONLY--- Pager [phone] (7am - 7am) Advanced Heart Failure Team  Pager [phone] (M-F; 7a - 4p)  Please contact [facility] for night-coverage after hours (4p -7a ) and weekends on [email or web address]  Patient seen and examined with Darrick Grinder, NP. We discussed all aspects of the encounter. I agree with the assessment and plan as stated above.   PICC line has been replaced. Volume status stable. 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 VAD interrogated personally. Parameters stable.  Ok for d/c to SNF today.   Glori Bickers, MD  3:16 PM

## 2018-01-28 IMAGING — CR DG CHEST 1V PORT
1 series · 1 of 1 positions shown · non-contrast
Comparison: 09/08/2016

CLINICAL DATA: LVAD, chest tube

EXAM:
PORTABLE CHEST 1 VIEW

[AP]
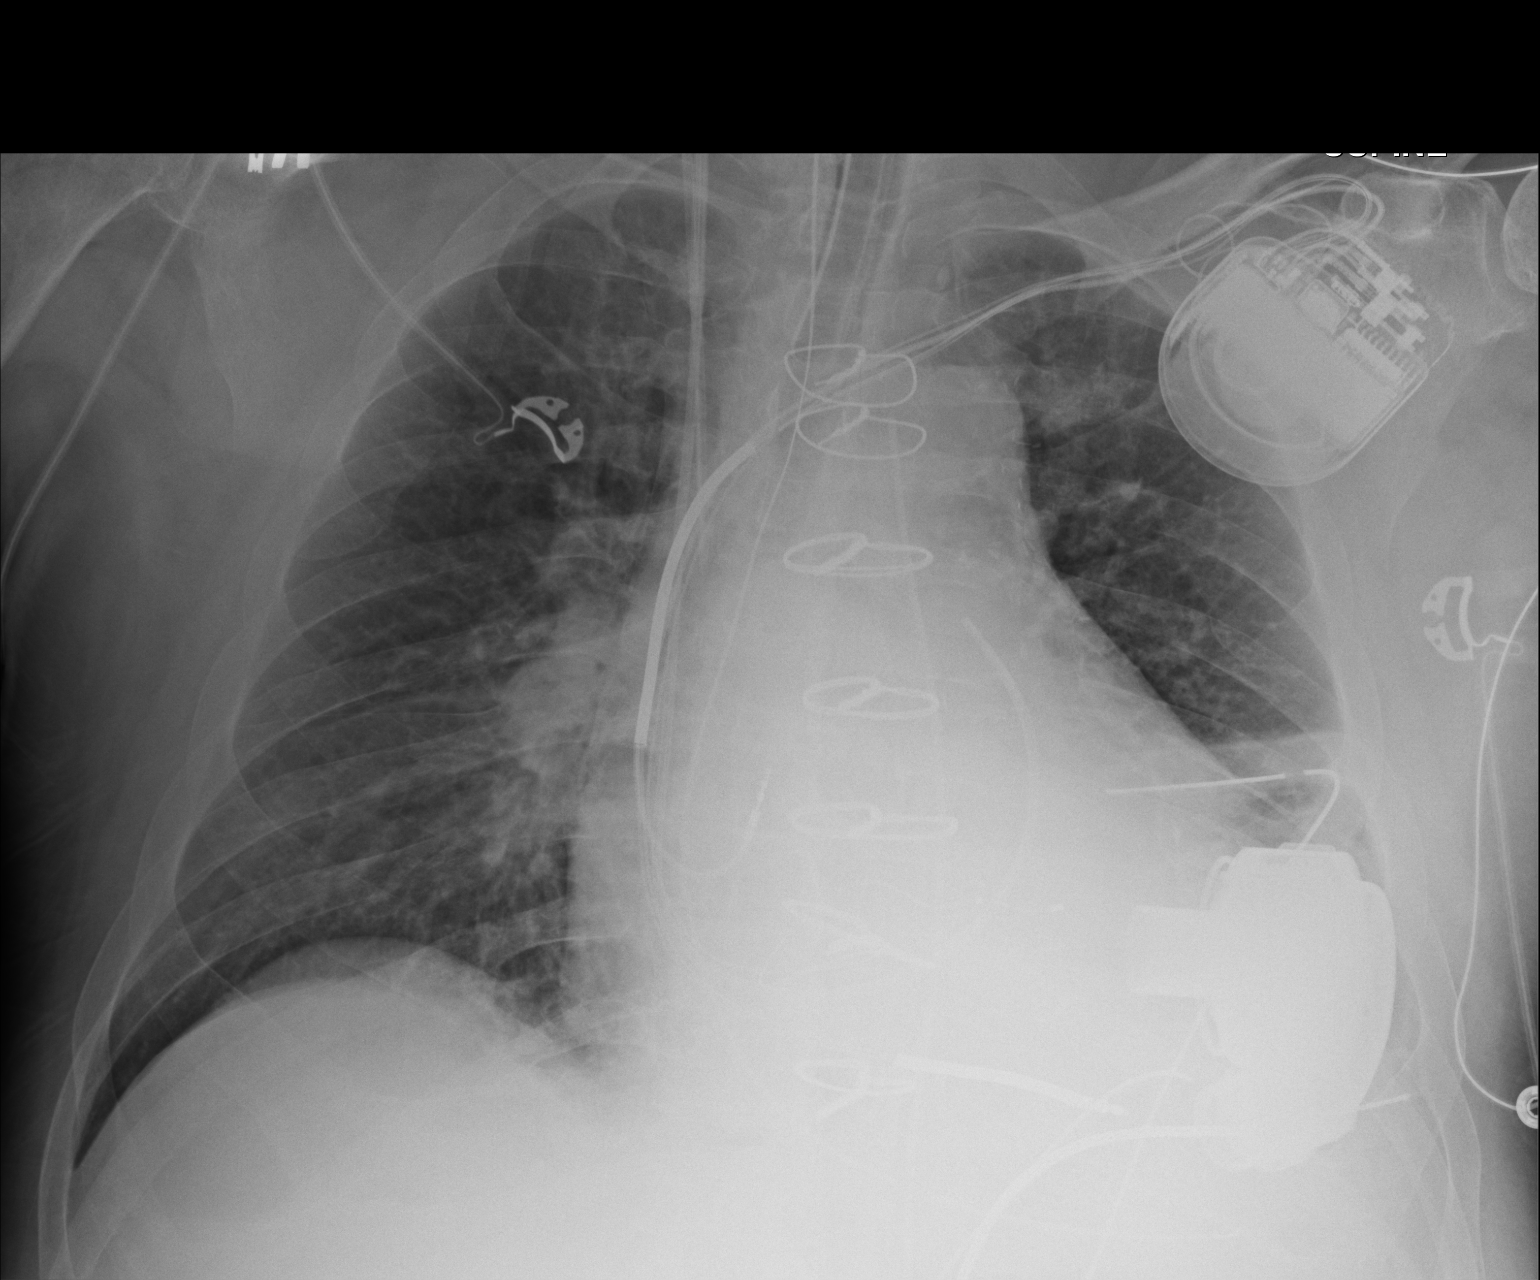

[1 of 1 positions shown; findings below may reference images not displayed]

FINDINGS: Endotracheal tube has been placed with the tip 5.5 cm above the
carina. Swan-Ganz catheter tip is in the main pulmonary artery. Left
chest tube is in place. No pneumothorax. Left AICD remains in place,
unchanged. LVAD is in place. Cardiomegaly with vascular congestion.
IMPRESSION: LVAD placement. Left chest tube without pneumothorax. Endotracheal
tube in expected position.

Cardiomegaly, vascular congestion.

## 2018-01-29 IMAGING — CR DG CHEST 1V PORT
1 series · 1 of 1 positions shown · non-contrast
Comparison: 09/09/2016

CLINICAL DATA: Postop LVAD.  Chest tube, intubated

EXAM:
PORTABLE CHEST 1 VIEW

[AP]
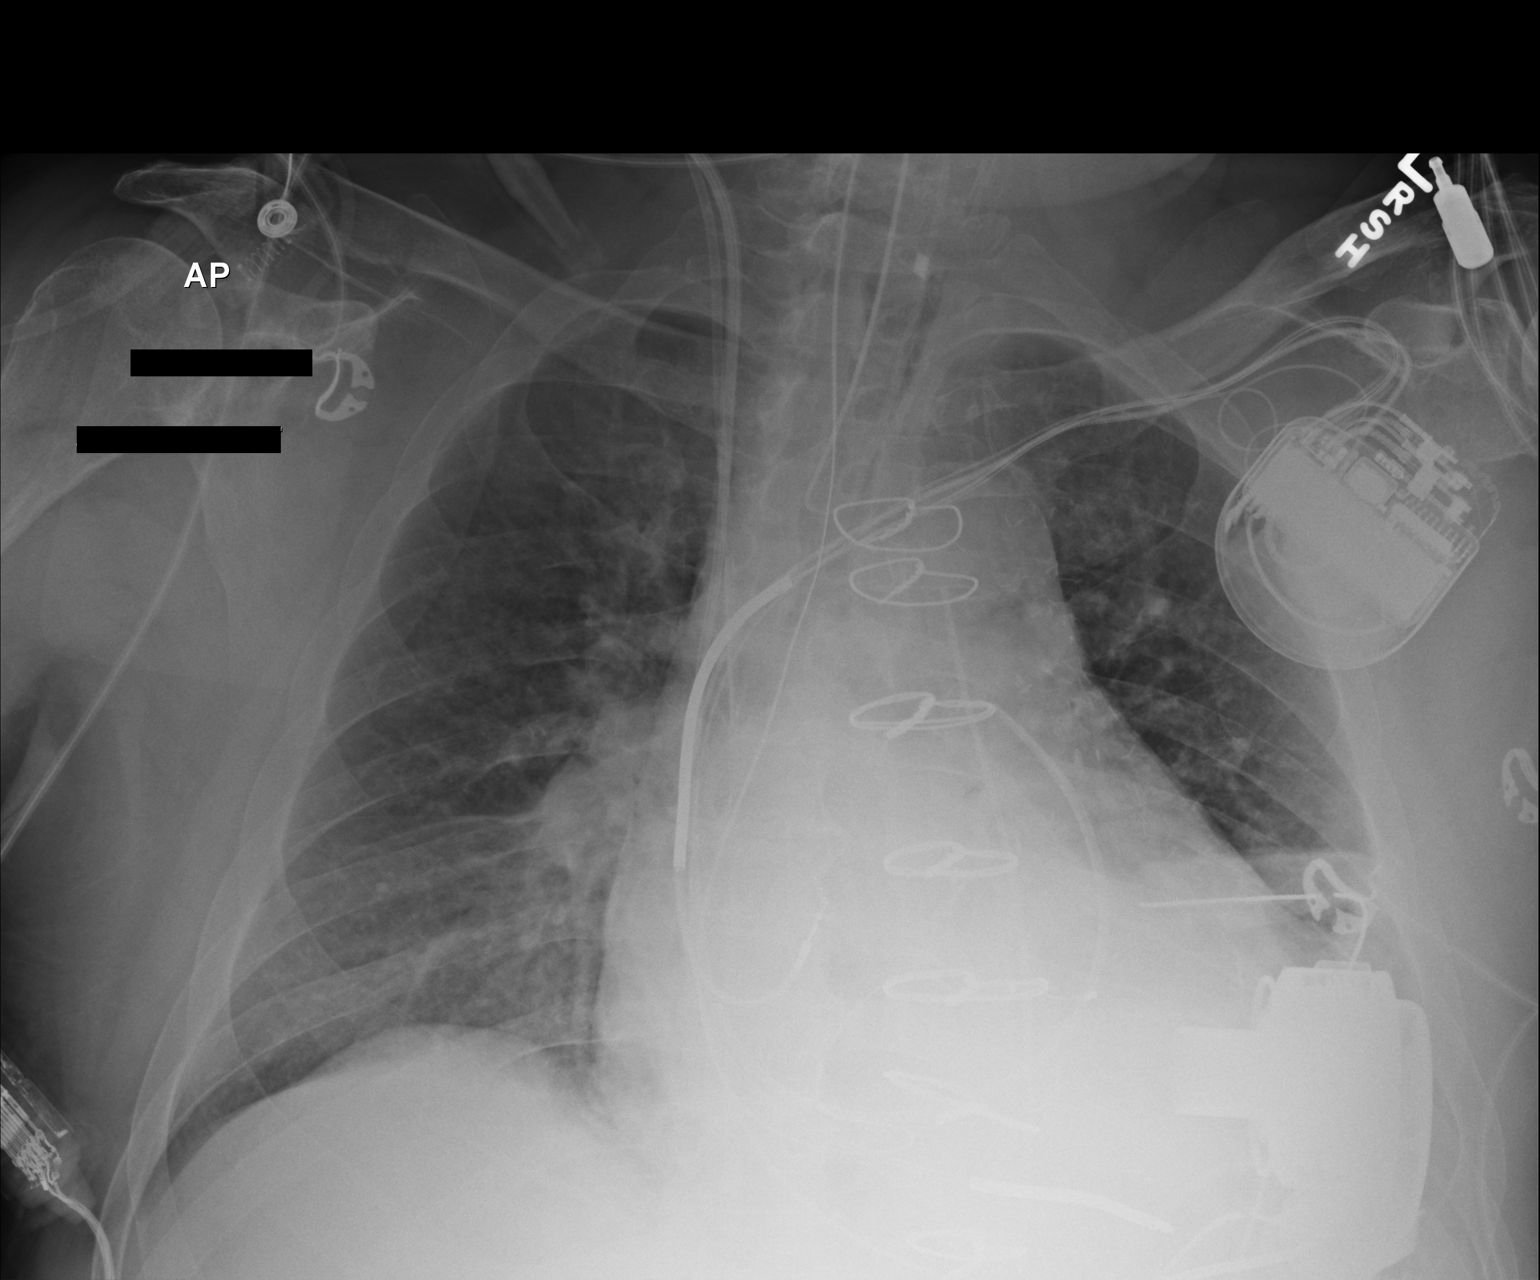

[1 of 1 positions shown; findings below may reference images not displayed]

FINDINGS: Support devices remain in place, unchanged. No pneumothorax. LVAD
and left chest tubes project over the left heart. Mild cardiomegaly.
Left lower lobe opacity likely reflects atelectasis. Possible small
left effusion.
IMPRESSION: Support devices stable.

No pneumothorax.

Left lower lobe atelectasis and possible small left effusion.

## 2018-01-30 IMAGING — CR DG CHEST 1V PORT
1 series · 1 of 1 positions shown · non-contrast
Comparison: 09/10/2016

CLINICAL DATA: LVAD.

EXAM:
PORTABLE CHEST 1 VIEW

[AP]
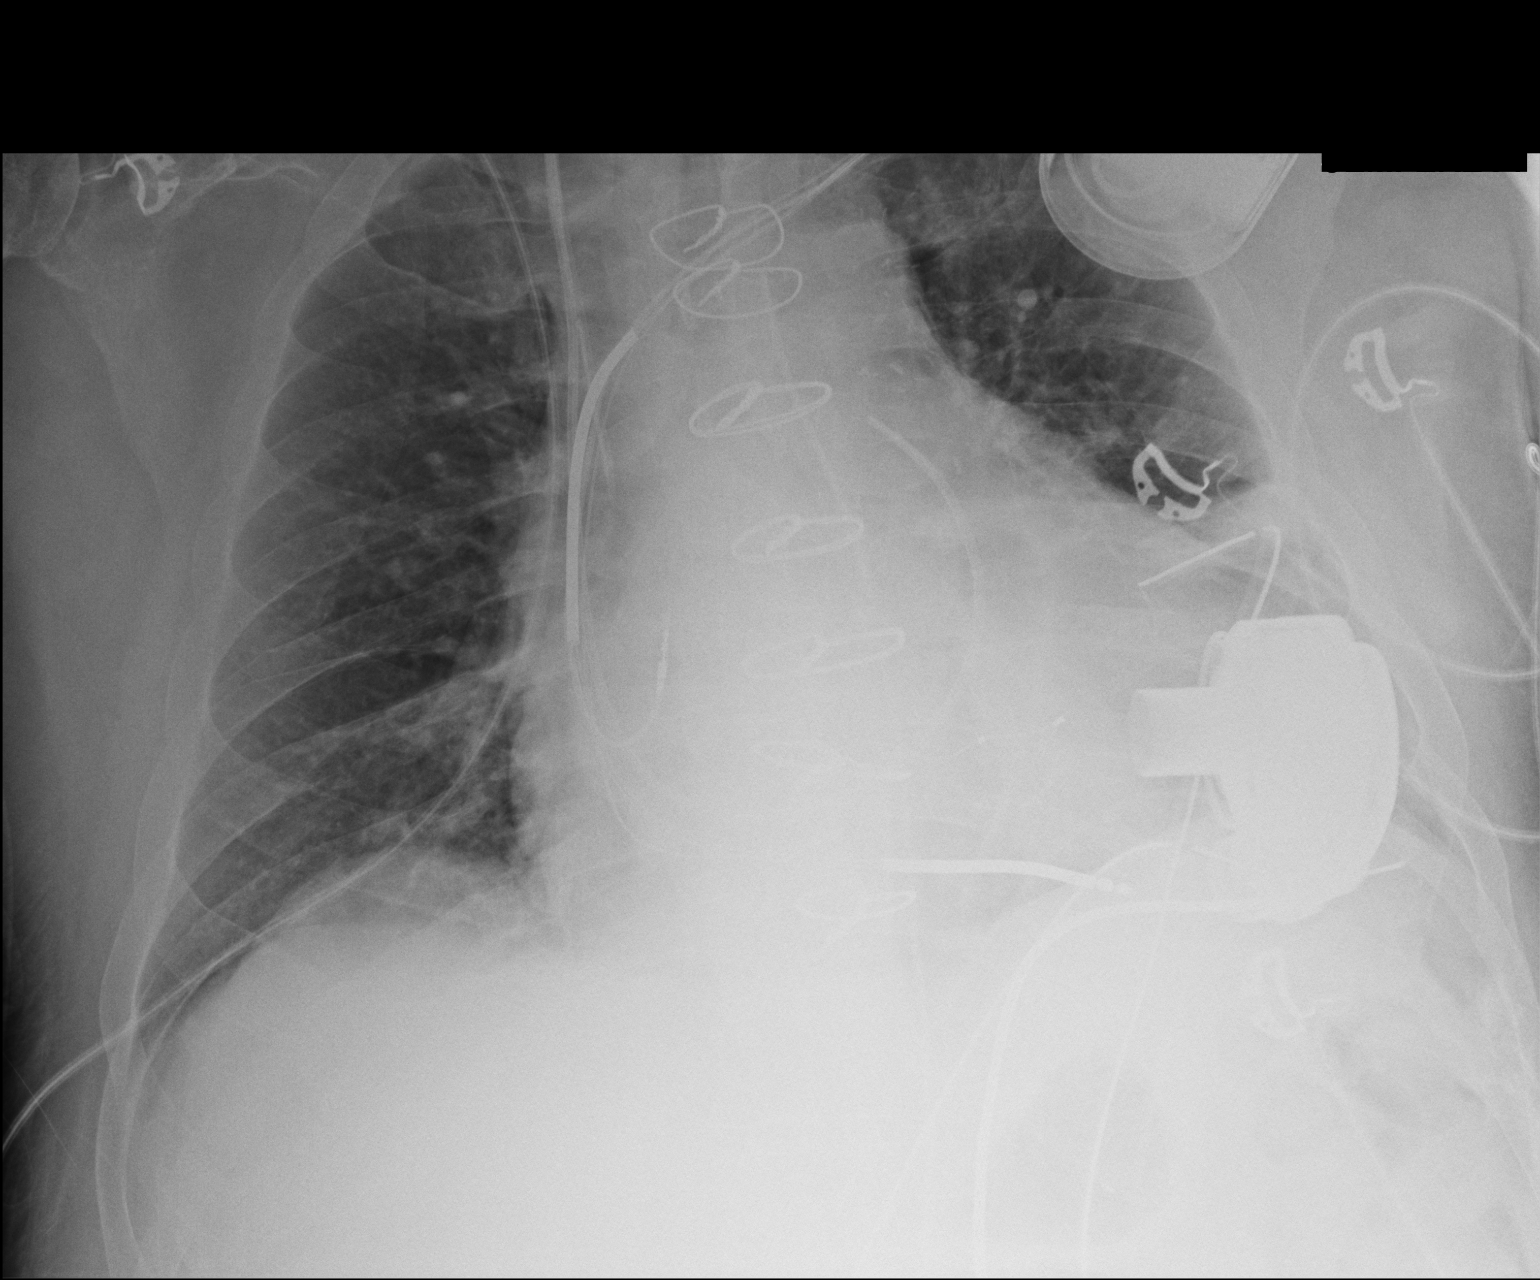

[1 of 1 positions shown; findings below may reference images not displayed]

FINDINGS: LVAD unchanged. Sternotomy wires and left-sided pacemaker unchanged.
Right IJ Swan-Ganz catheter with tip over the main pulmonary artery
segment unchanged. Right IJ central venous catheter with tip over
the SVC unchanged. Two left-sided chest tubes present. Mediastinal
drain unchanged. The

Lungs are adequately inflated without definite focal consolidation,
effusion or pneumothorax. Mild stable cardiomegaly. Remainder of the
exam is unchanged.
IMPRESSION: No acute pulmonary disease.  Mild stable cardiomegaly.

Tubes and lines as described.  LVAD present.

## 2018-01-31 ENCOUNTER — Telehealth (HOSPITAL_COMMUNITY): Payer: Self-pay | Admitting: *Deleted

## 2018-01-31 IMAGING — CR DG CHEST 1V PORT
1 series · 1 of 1 positions shown · non-contrast
Comparison: Earlier same day; 09/11/2016

CLINICAL DATA: PICC line placement

EXAM:
PORTABLE CHEST 1 VIEW

[AP]
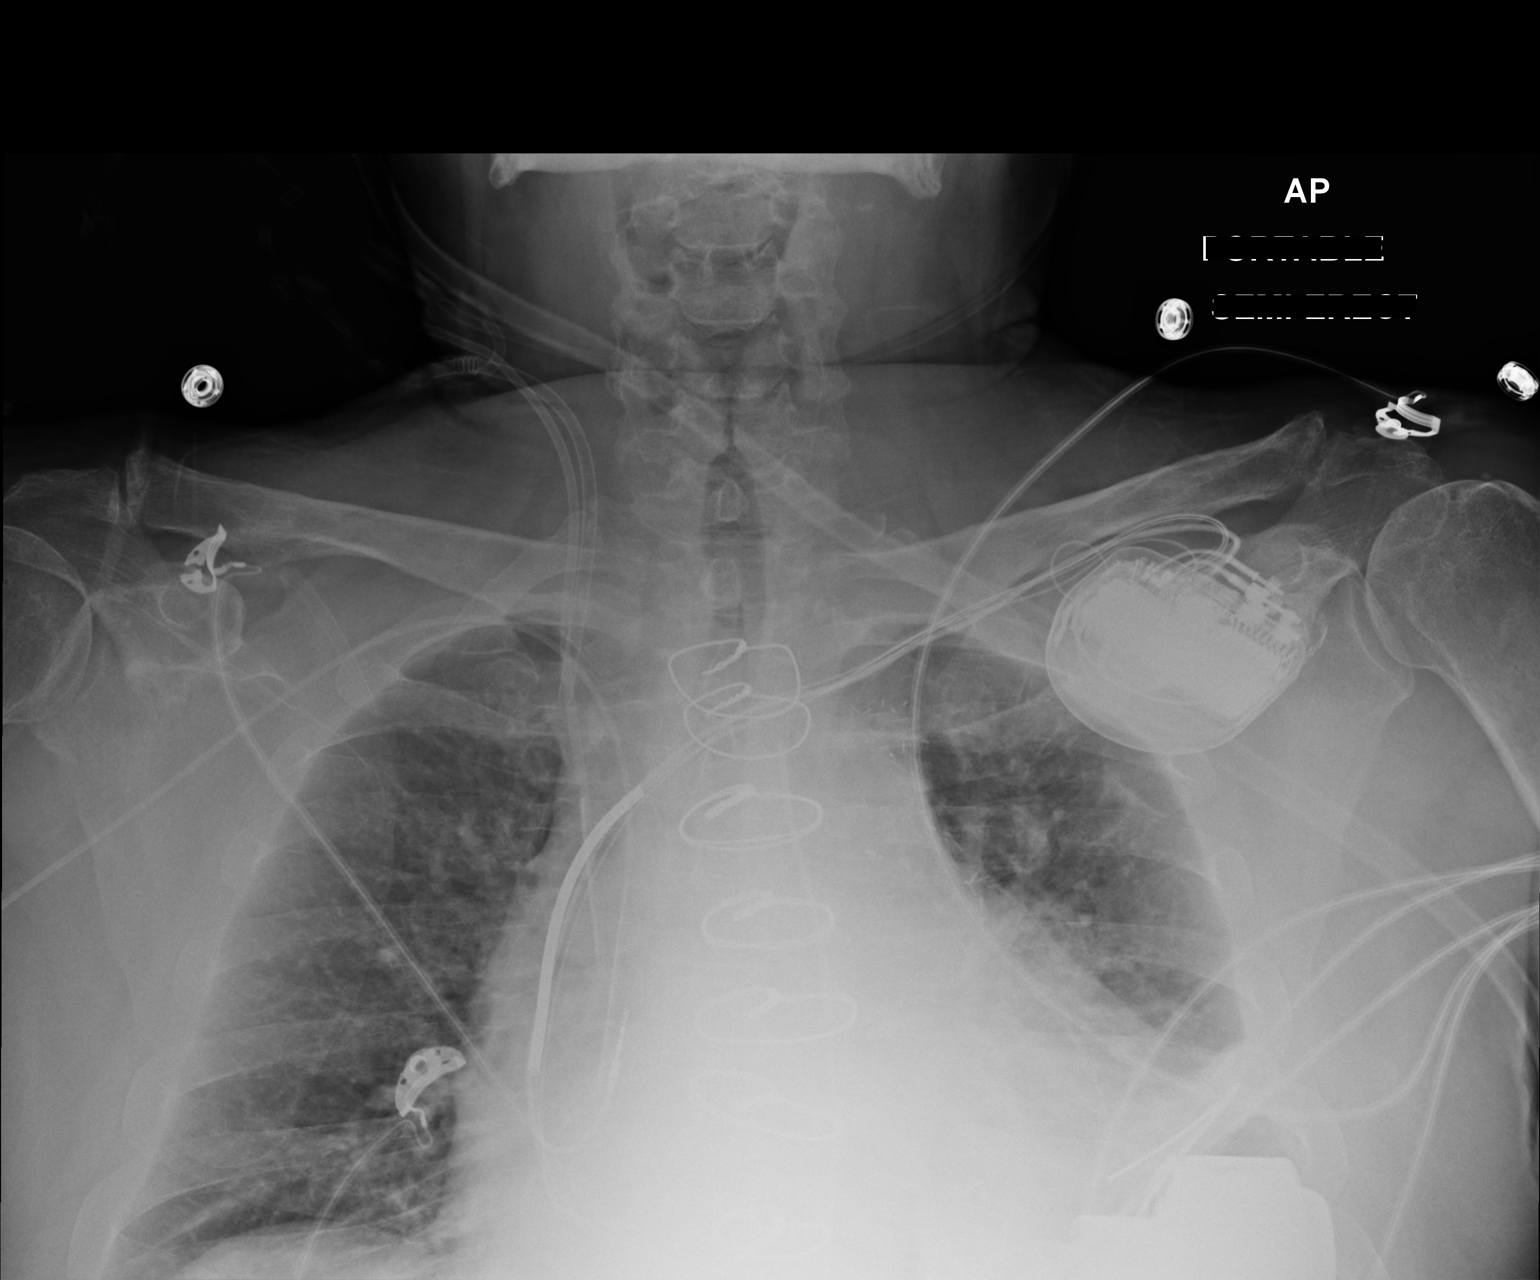

[1 of 1 positions shown; findings below may reference images not displayed]

FINDINGS: Examination is degraded secondary to exclusion of the bilateral
costophrenic angles.

Interval placement of right upper extremity approach PICC line with
tip projected over the superior cavoatrial junction. Otherwise,
stable position of support apparatus. No pneumothorax. Grossly
unchanged bibasilar heterogeneous / consolidative opacities, left
greater than right. No definite pleural effusion, though note, the
bilateral costophrenic angles are excluded from view. Pulmonary is
congestion without frank evidence of edema. No pneumothorax.
Unchanged bones.
IMPRESSION: 1. Right upper extremity approach PICC line tip projects over the
superior cavoatrial junction.
2. Otherwise, stable positioning remaining support apparatus. No
pneumothorax.

## 2018-01-31 NOTE — Telephone Encounter (Signed)
Called [facility] to arrange transportation for pt's clinic visit this Friday, 02/03/18 at 11:00. Unable to contact nurse/supervisor. Freda Munro, receptionist took message and will pass on the pt's nurse. Gave her VAD pager for any questions or concerns.

## 2018-02-01 IMAGING — CR DG CHEST 1V PORT
1 series · 1 of 1 positions shown · non-contrast
Comparison: 09/12/2016

CLINICAL DATA: Congestive heart failure and coronary artery
disease. Left ventricular assist device.

EXAM:
PORTABLE CHEST 1 VIEW

[AP]
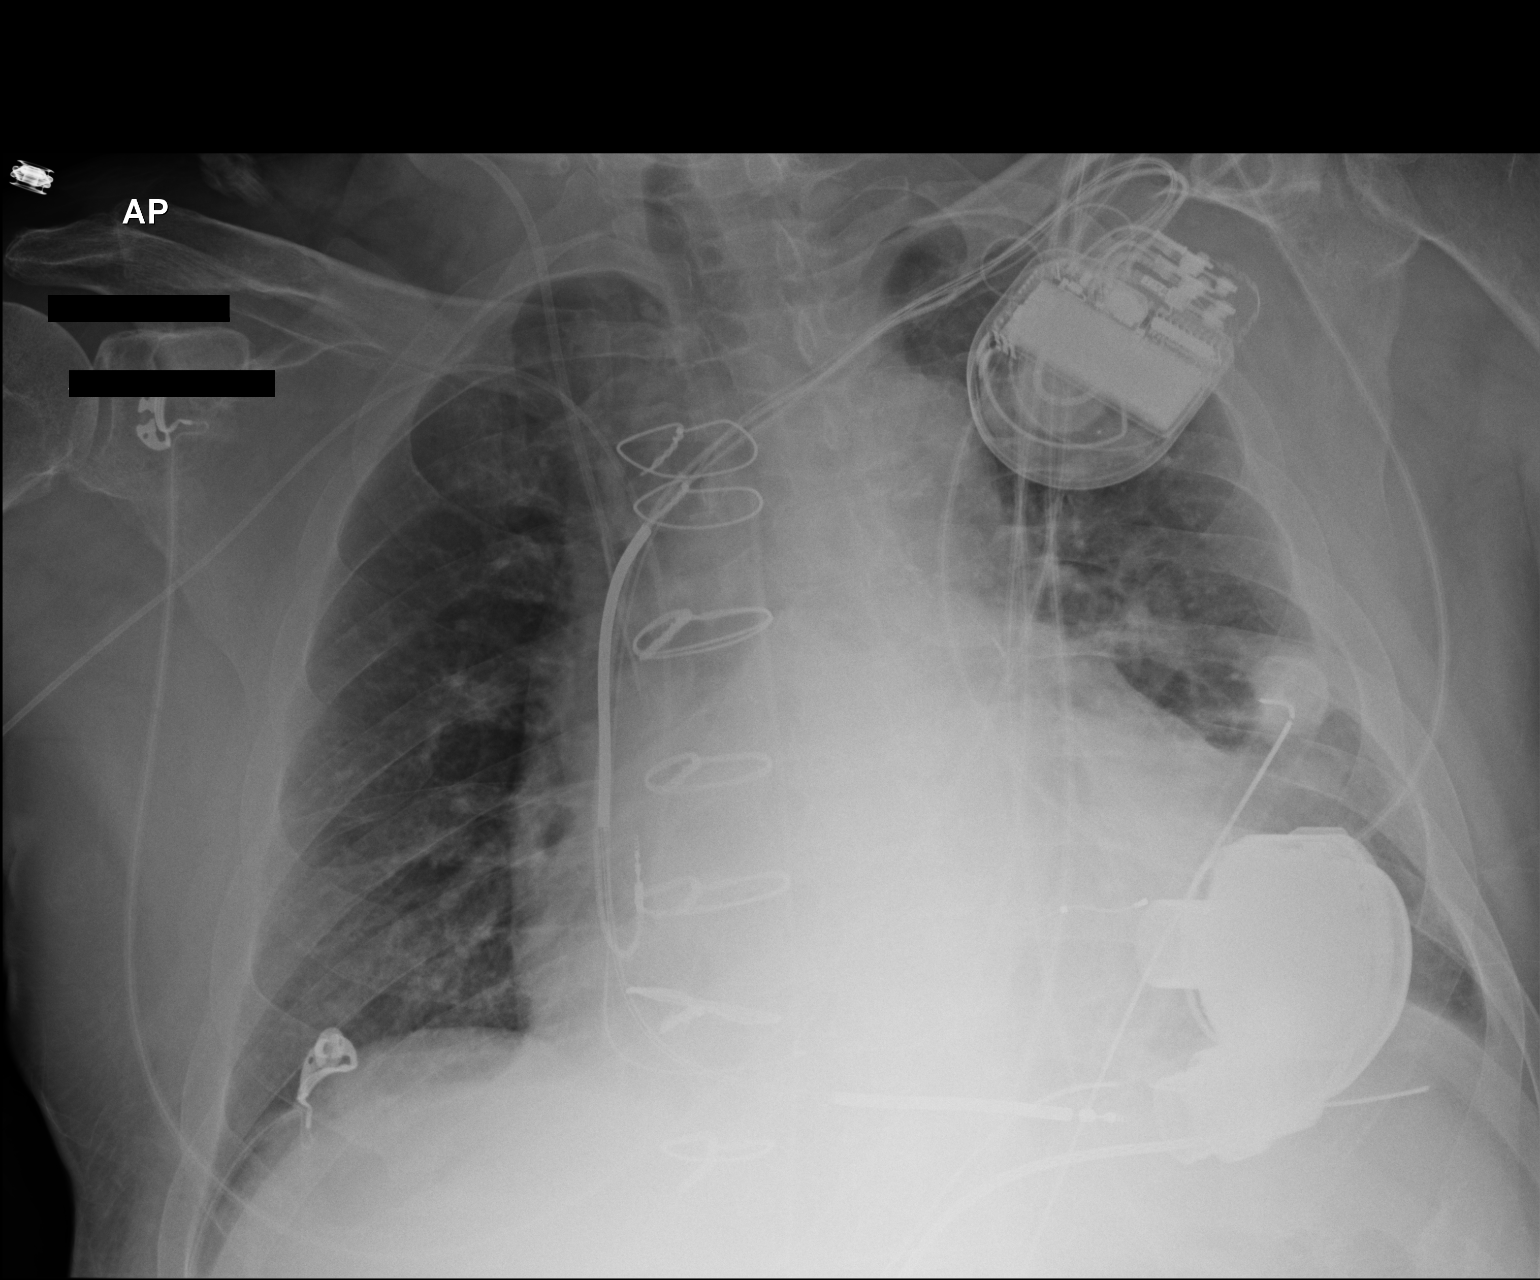

[1 of 1 positions shown; findings below may reference images not displayed]

FINDINGS: Support apparatus remains in appropriate position, including left
chest tube. No pneumothorax visualized.

Cardiomegaly is stable. Improved aeration of both lungs. Both lungs
are clear.
IMPRESSION: Stable cardiomegaly.  No active lung disease.

## 2018-02-02 ENCOUNTER — Encounter (HOSPITAL_COMMUNITY): Payer: Self-pay

## 2018-02-02 ENCOUNTER — Other Ambulatory Visit (HOSPITAL_COMMUNITY): Payer: Self-pay | Admitting: Unknown Physician Specialty

## 2018-02-02 DIAGNOSIS — Z95811 Presence of heart assist device: Secondary | ICD-10-CM

## 2018-02-02 DIAGNOSIS — Z7901 Long term (current) use of anticoagulants: Secondary | ICD-10-CM

## 2018-02-02 IMAGING — CR DG CHEST 1V PORT
1 series · 1 of 1 positions shown · non-contrast
Comparison: September 13, 2016

CLINICAL DATA: Left ventricular assist device placement

EXAM:
PORTABLE CHEST 1 VIEW

[AP]
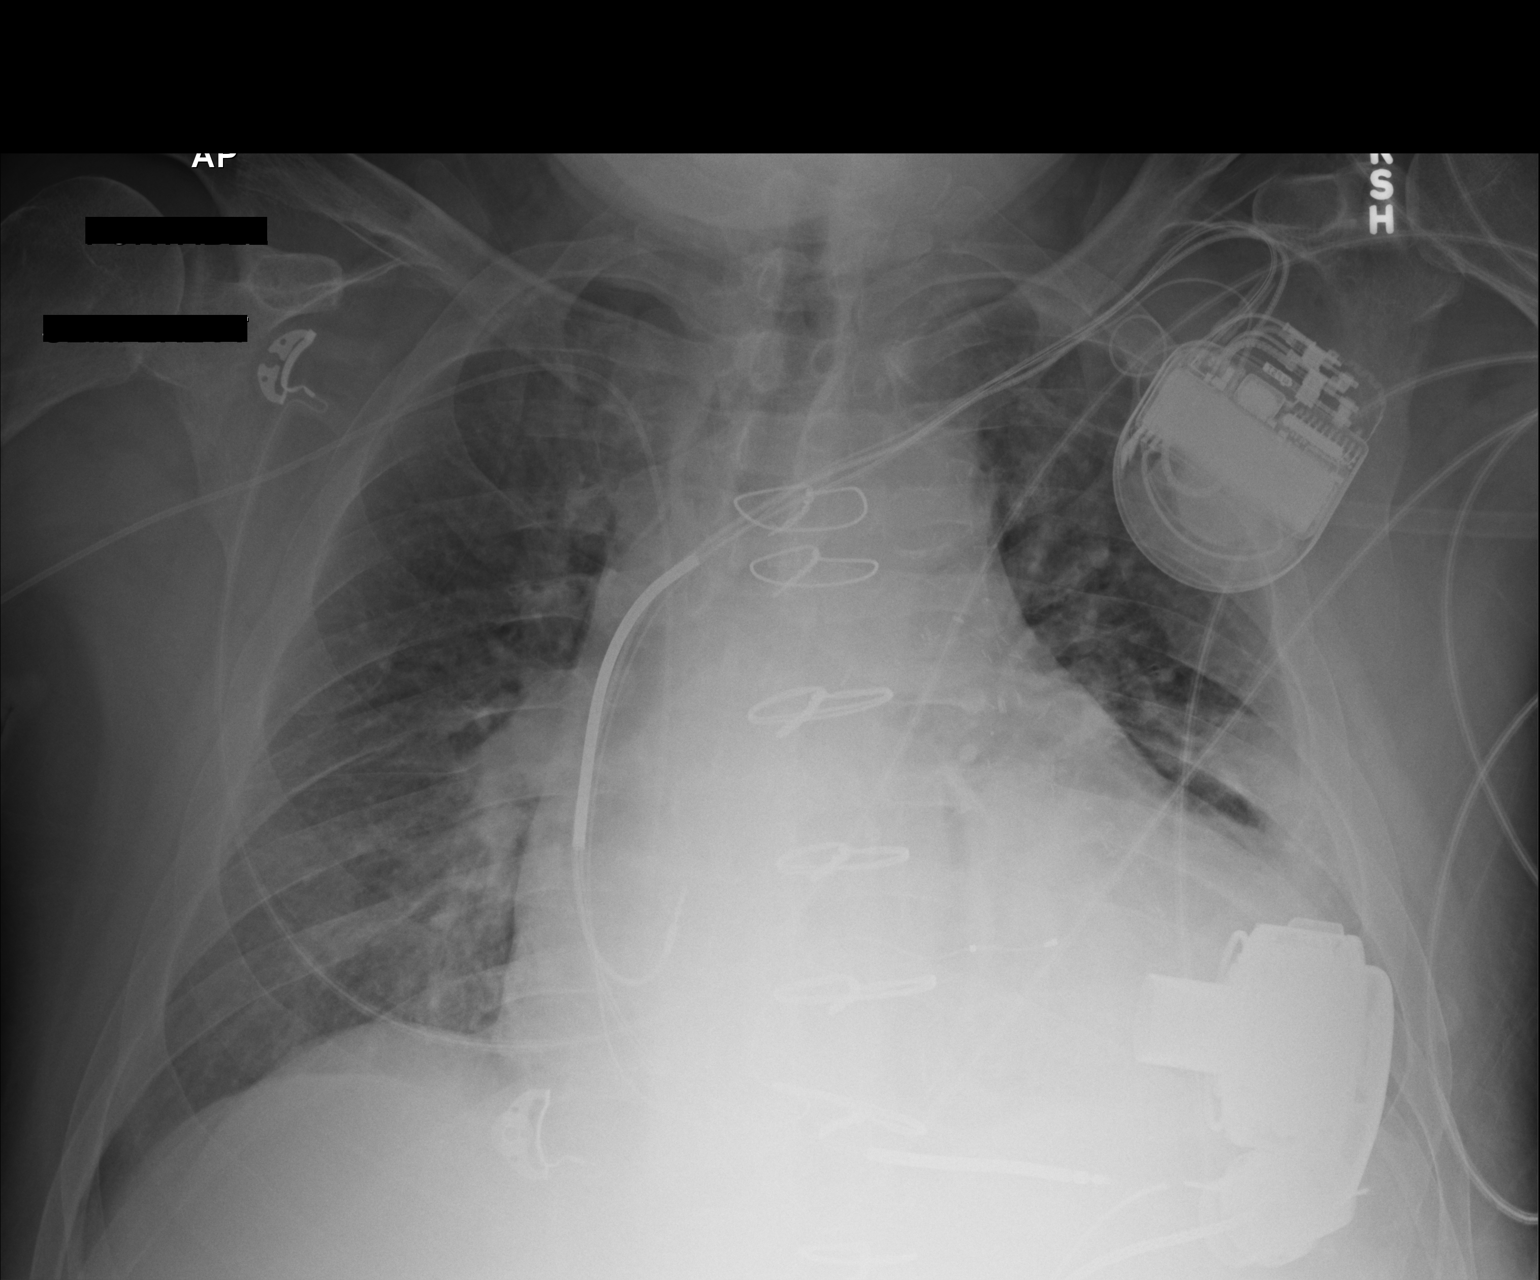

[1 of 1 positions shown; findings below may reference images not displayed]

FINDINGS: More superiorly located left chest tube has been removed. Second
chest tube more inferiorly position on the left remains without
change. Right jugular catheter has been removed. Right subclavian
catheter tip is at the cavoatrial junction. Pacemaker leads are
attached to the right atrium, right ventricle, and left ventricle.
There is a left ventricular assist device present. No pneumothorax.
There is patchy consolidation in the left lower lobe. Lungs
elsewhere are clear. Cardiomegaly is stable with pulmonary
vascularity within normal limits. There is atherosclerotic
calcification in the aorta. No adenopathy.
IMPRESSION: Tube and catheter positions as described without pneumothorax. There
is patchy consolidation in the left lower lobe. Lungs elsewhere
clear. Stable cardiomegaly. There is aortic atherosclerosis.

## 2018-02-03 ENCOUNTER — Ambulatory Visit (HOSPITAL_COMMUNITY)
Admit: 2018-02-03 | Discharge: 2018-02-03 | Disposition: A | Discharge status: Home / Self Care | Payer: Medicare HMO | Source: Ambulatory Visit | Attending: Cardiology | Admitting: Cardiology

## 2018-02-03 ENCOUNTER — Ambulatory Visit (HOSPITAL_COMMUNITY): Payer: Self-pay | Admitting: Pharmacist

## 2018-02-03 ENCOUNTER — Encounter (HOSPITAL_COMMUNITY): Payer: Self-pay

## 2018-02-03 VITALS — BP 115/66 | HR 74 | Ht 69.0 in | Wt 208.8 lb

## 2018-02-03 DIAGNOSIS — I5022 Chronic systolic (congestive) heart failure: Secondary | ICD-10-CM | POA: Insufficient documentation

## 2018-02-03 DIAGNOSIS — Z95811 Presence of heart assist device: Secondary | ICD-10-CM | POA: Diagnosis not present

## 2018-02-03 DIAGNOSIS — I5082 Biventricular heart failure: Secondary | ICD-10-CM | POA: Diagnosis not present

## 2018-02-03 DIAGNOSIS — Z794 Long term (current) use of insulin: Secondary | ICD-10-CM | POA: Diagnosis not present

## 2018-02-03 DIAGNOSIS — I255 Ischemic cardiomyopathy: Secondary | ICD-10-CM | POA: Diagnosis not present

## 2018-02-03 DIAGNOSIS — K319 Disease of stomach and duodenum, unspecified: Secondary | ICD-10-CM | POA: Insufficient documentation

## 2018-02-03 DIAGNOSIS — J449 Chronic obstructive pulmonary disease, unspecified: Secondary | ICD-10-CM | POA: Insufficient documentation

## 2018-02-03 DIAGNOSIS — Z7901 Long term (current) use of anticoagulants: Secondary | ICD-10-CM | POA: Insufficient documentation

## 2018-02-03 DIAGNOSIS — E119 Type 2 diabetes mellitus without complications: Secondary | ICD-10-CM | POA: Insufficient documentation

## 2018-02-03 DIAGNOSIS — K7460 Unspecified cirrhosis of liver: Secondary | ICD-10-CM

## 2018-02-03 DIAGNOSIS — I25810 Atherosclerosis of coronary artery bypass graft(s) without angina pectoris: Secondary | ICD-10-CM | POA: Diagnosis not present

## 2018-02-03 DIAGNOSIS — I480 Paroxysmal atrial fibrillation: Secondary | ICD-10-CM | POA: Diagnosis not present

## 2018-02-03 DIAGNOSIS — D509 Iron deficiency anemia, unspecified: Secondary | ICD-10-CM | POA: Insufficient documentation

## 2018-02-03 DIAGNOSIS — E7800 Pure hypercholesterolemia, unspecified: Secondary | ICD-10-CM | POA: Diagnosis not present

## 2018-02-03 DIAGNOSIS — I2510 Atherosclerotic heart disease of native coronary artery without angina pectoris: Secondary | ICD-10-CM

## 2018-02-03 DIAGNOSIS — I110 Hypertensive heart disease with heart failure: Secondary | ICD-10-CM | POA: Insufficient documentation

## 2018-02-03 DIAGNOSIS — I50810 Right heart failure, unspecified: Secondary | ICD-10-CM

## 2018-02-03 DIAGNOSIS — F419 Anxiety disorder, unspecified: Secondary | ICD-10-CM | POA: Insufficient documentation

## 2018-02-03 DIAGNOSIS — Z79899 Other long term (current) drug therapy: Secondary | ICD-10-CM | POA: Insufficient documentation

## 2018-02-03 DIAGNOSIS — K7290 Hepatic failure, unspecified without coma: Secondary | ICD-10-CM | POA: Insufficient documentation

## 2018-02-03 DIAGNOSIS — Z87891 Personal history of nicotine dependence: Secondary | ICD-10-CM | POA: Insufficient documentation

## 2018-02-03 DIAGNOSIS — F329 Major depressive disorder, single episode, unspecified: Secondary | ICD-10-CM | POA: Diagnosis not present

## 2018-02-03 LAB — BASIC METABOLIC PANEL
Anion gap: 11 (ref 5–15)
BUN: 29 mg/dL — AB (ref 6–20)
CALCIUM: 8.6 mg/dL — AB (ref 8.9–10.3)
CO2: 26 mmol/L (ref 22–32)
CREATININE: 1.18 mg/dL (ref 0.61–1.24)
Chloride: 93 mmol/L — ABNORMAL LOW (ref 101–111)
GFR calc Af Amer: >60 mL/min (ref >60)
Glucose, Bld: 222 mg/dL — ABNORMAL HIGH (ref 65–99)
POTASSIUM: 4 mmol/L (ref 3.5–5.1)
SODIUM: 130 mmol/L — AB (ref 135–145)

## 2018-02-03 LAB — CBC
HCT: 29.2 % — ABNORMAL LOW (ref 39.0–52.0)
Hemoglobin: 8.9 g/dL — ABNORMAL LOW (ref 13.0–17.0)
MCH: 23.9 pg — ABNORMAL LOW (ref 26.0–34.0)
MCHC: 30.5 g/dL (ref 30.0–36.0)
MCV: 78.5 fL (ref 78.0–100.0)
PLATELETS: 142 10*3/uL — AB (ref 150–400)
RBC: 3.72 MIL/uL — ABNORMAL LOW (ref 4.22–5.81)
RDW: 18.5 % — AB (ref 11.5–15.5)
WBC: 6.2 10*3/uL (ref 4.0–10.5)

## 2018-02-03 LAB — LACTATE DEHYDROGENASE: LDH: 150 U/L (ref 98–192)

## 2018-02-03 LAB — PROTIME-INR
INR: 1.95
PROTHROMBIN TIME: 22.1 s — AB (ref 11.4–15.2)

## 2018-02-03 IMAGING — CR DG CHEST 1V PORT
1 series · 1 of 1 positions shown · non-contrast
Comparison: 09/14/2016 and earlier.

CLINICAL DATA: 60-year-old male status post LVAD. Initial
encounter.

EXAM:
PORTABLE CHEST 1 VIEW

[AP]
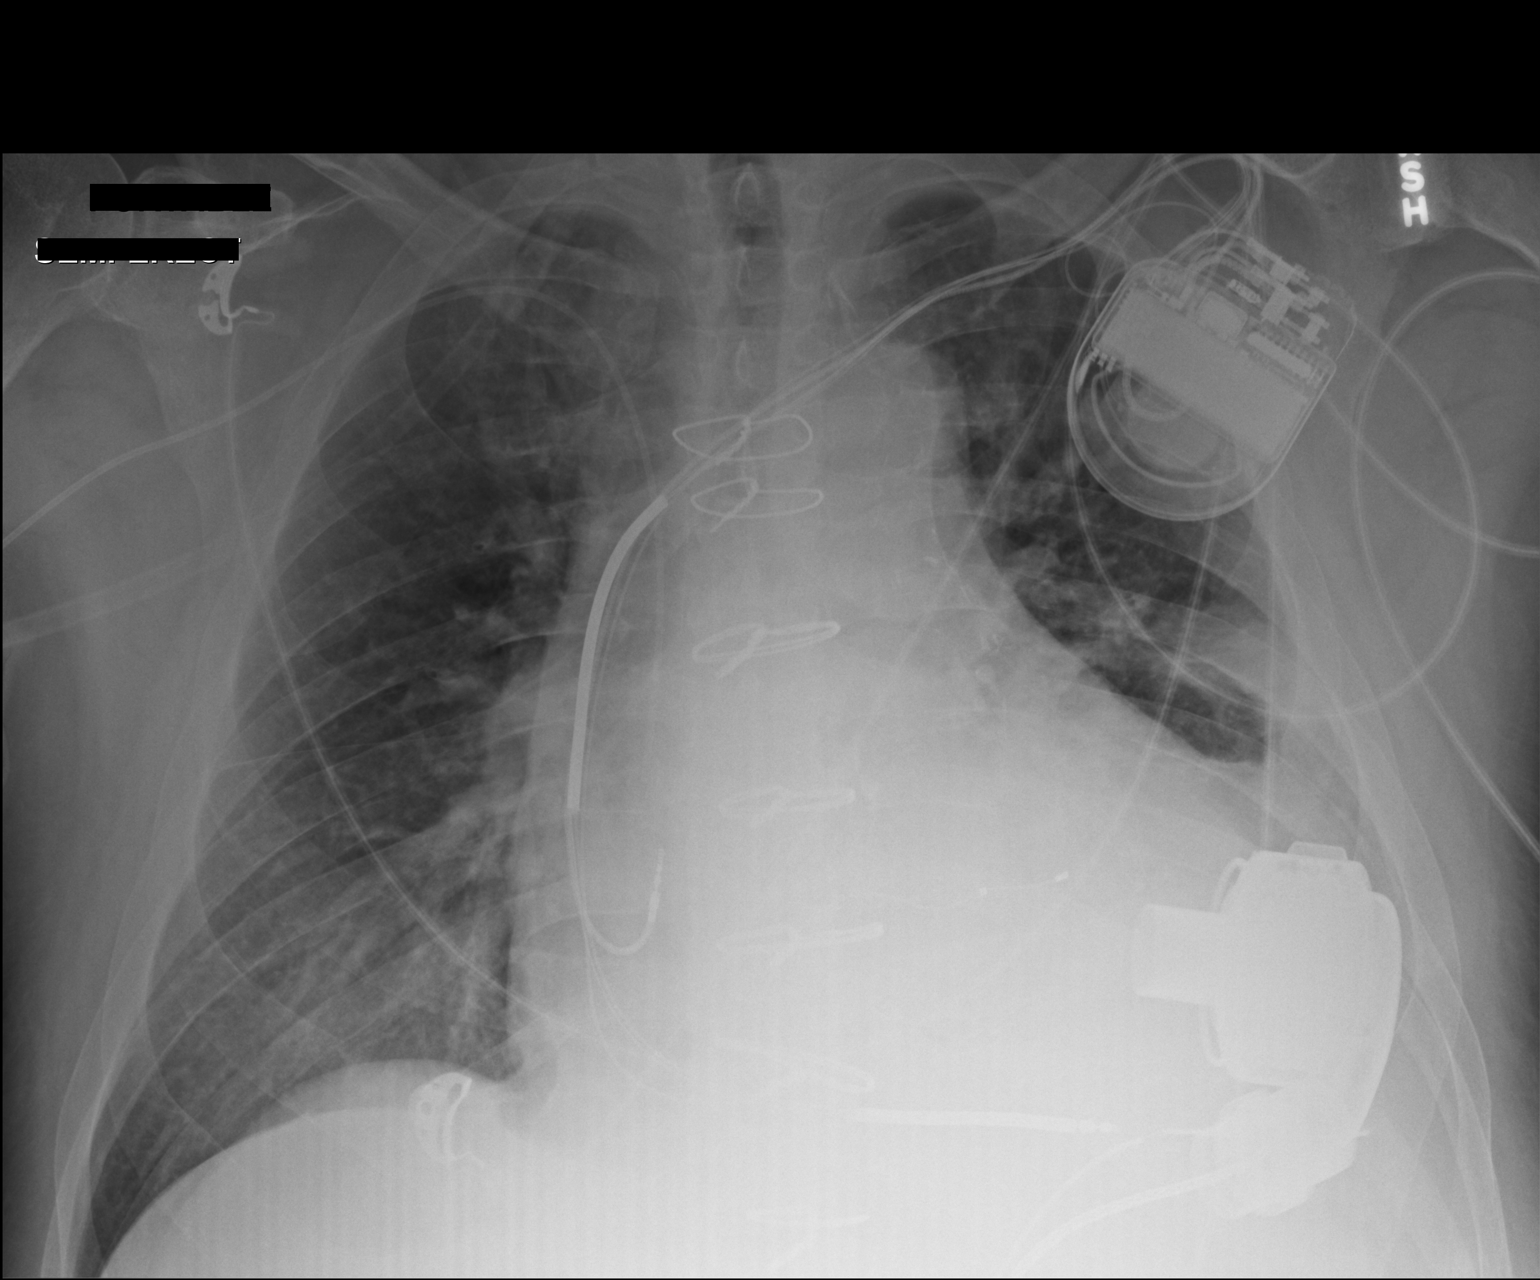

[1 of 1 positions shown; findings below may reference images not displayed]

FINDINGS: Portable AP semi upright view at 2557 hours. Visible LVAD hardware
appears stable. Superimposed left chest cardiac AICD. Stable right
PICC line. Stable cardiomegaly and mediastinal contours. Visualized
tracheal air column is within normal limits.

Continued retrocardiac hypo ventilation, unchanged. No superimposed
pneumothorax, pulmonary edema or confluent right lung opacity.
Possible small left pleural effusion.
IMPRESSION: 1. Stable lungs with retrocardiac hypoventilation. Possible small
left pleural effusion, but no pneumothorax or edema.
2. Stable visible LVAD hardware, left chest cardiac AICD and right
PICC line.

## 2018-02-03 NOTE — Patient Instructions (Signed)
1. No changes in medications. 2. Return to clinic Friday May 10th at 1000am.

## 2018-02-03 NOTE — Progress Notes (Signed)
Pt presents to clinic for his 1 week hospital follow up. Pt denies any issues with his equipment or alarms from his VAD.  Pt does not have back up bag with him today. He states that [facility] does not believe he needs to take it with him wherever he goes. Pt also states that he needs 2 more batteries. He has 2 that are charging at [employer] and he is wearing 2. He states he needs 2 more for his back up bag.  Pt states that he does not want to be at [facility] any longer. Pt states that he would like to go home.   Vital Signs:  Doppler Pressure: UTO  Automatc BP:  115/66 (79) HR: 74 SPO2:unable to obtain %  Weight: 208.8 lbs w/o eqt Last weight: 204.2 lbs last clinic visit Home weights: 203 lbs this AM   VAD Indication: Destination Therapy - eval complete at [facility]  VAD interrogation & Equipment Management: Speed:5800 Flow: 5.3 Power:4.5 w    PI:2.5 Hct: 32  Alarms: none Events: 5 - 10 daily  Fixed speed 5800 Low speed limit: 5500  Primary Controller:  Replace back up battery in 20 months. Back up controller:   Back up bag was left at [facility].  Annual Equipment Maintenance on UBC/PM was performed on 09/2017.   I reviewed the LVAD parameters from today and compared the results to the patient's prior recorded data. LVAD interrogation was NEGATIVE for significant power changes, NEGATIVE for clinical alarms and STABLE for PI events/speed drops. No programming changes were made and pump is functioning within specified parameters. Pt is performing daily controller and system monitor self tests along with completing weekly and monthly maintenance for LVAD equipment.  LVAD equipment check completed and is in good working order. Back-up equipment not present today. Reminded patient he should keep black bag with him at all times.    Exit Site Care: Drive line dressing has been changed at [employer]. Drive line exit site well healed and incorporated. The velour is fully  implanted at exit site. Dressing dry and intact; anchor in place.   Significant Events on VAD Support:  11/30/16> Milrinone gtt at discharge 01/27/17>dobutamine at 5 mcg/kg/mingtt at discharge 03/08/17> RV failure 04/29/17> symptomatic anemia and melena. EGD revealed stomach ulcers. Carafate added; ASA stopped 05/31/17>Ongoing symptomatic anemia and melena; capsule study mild antral gastritis; poss tiny AVM right colon. Lower MAPS, Losartan cut back. 06/2017> admit with syncope, AMS, Dobutamine at 7.11mcg 11/09/17>Marked ascites, RV failure, and hepatic encephalopathy. Paracentesis on 2/7 and 2/12 with >11 liters fluid removed   Device:Protect Therapies: off; end of service 08/2016 Last check: complete interrogation 05/26/2017. DDD (lower rate 60); BiV pacing with 96% AS-VP  BP & Labs:  MAP 79 - Doppler is reflecting MAP  Hgb 8.9 - No S/S of bleeding. Specifically denies melena/BRBPR or nosebleeds.  LDH stable at 159 with established baseline of 150- 250. Denies tea-colored urine. No power elevations noted on interrogation.    Patient Instructions: 1. Return to clinic in 1 week.  2. Will call sister-Terrie to determine pts future destination. 3. I have left a message at [facility] requesting their plan for discharge.  Tanda Rockers RN [place] Coordinator   Office: [phone] 24/7 Emergency VAD Pager: [phone]

## 2018-02-05 IMAGING — DX DG CHEST 2V
2 series · 2 of 2 positions shown · non-contrast
Comparison: 09/16/2016

CLINICAL DATA: LVAD.  Chronic systolic congestive heart failure.

EXAM:
CHEST  2 VIEW

[w chest pa]
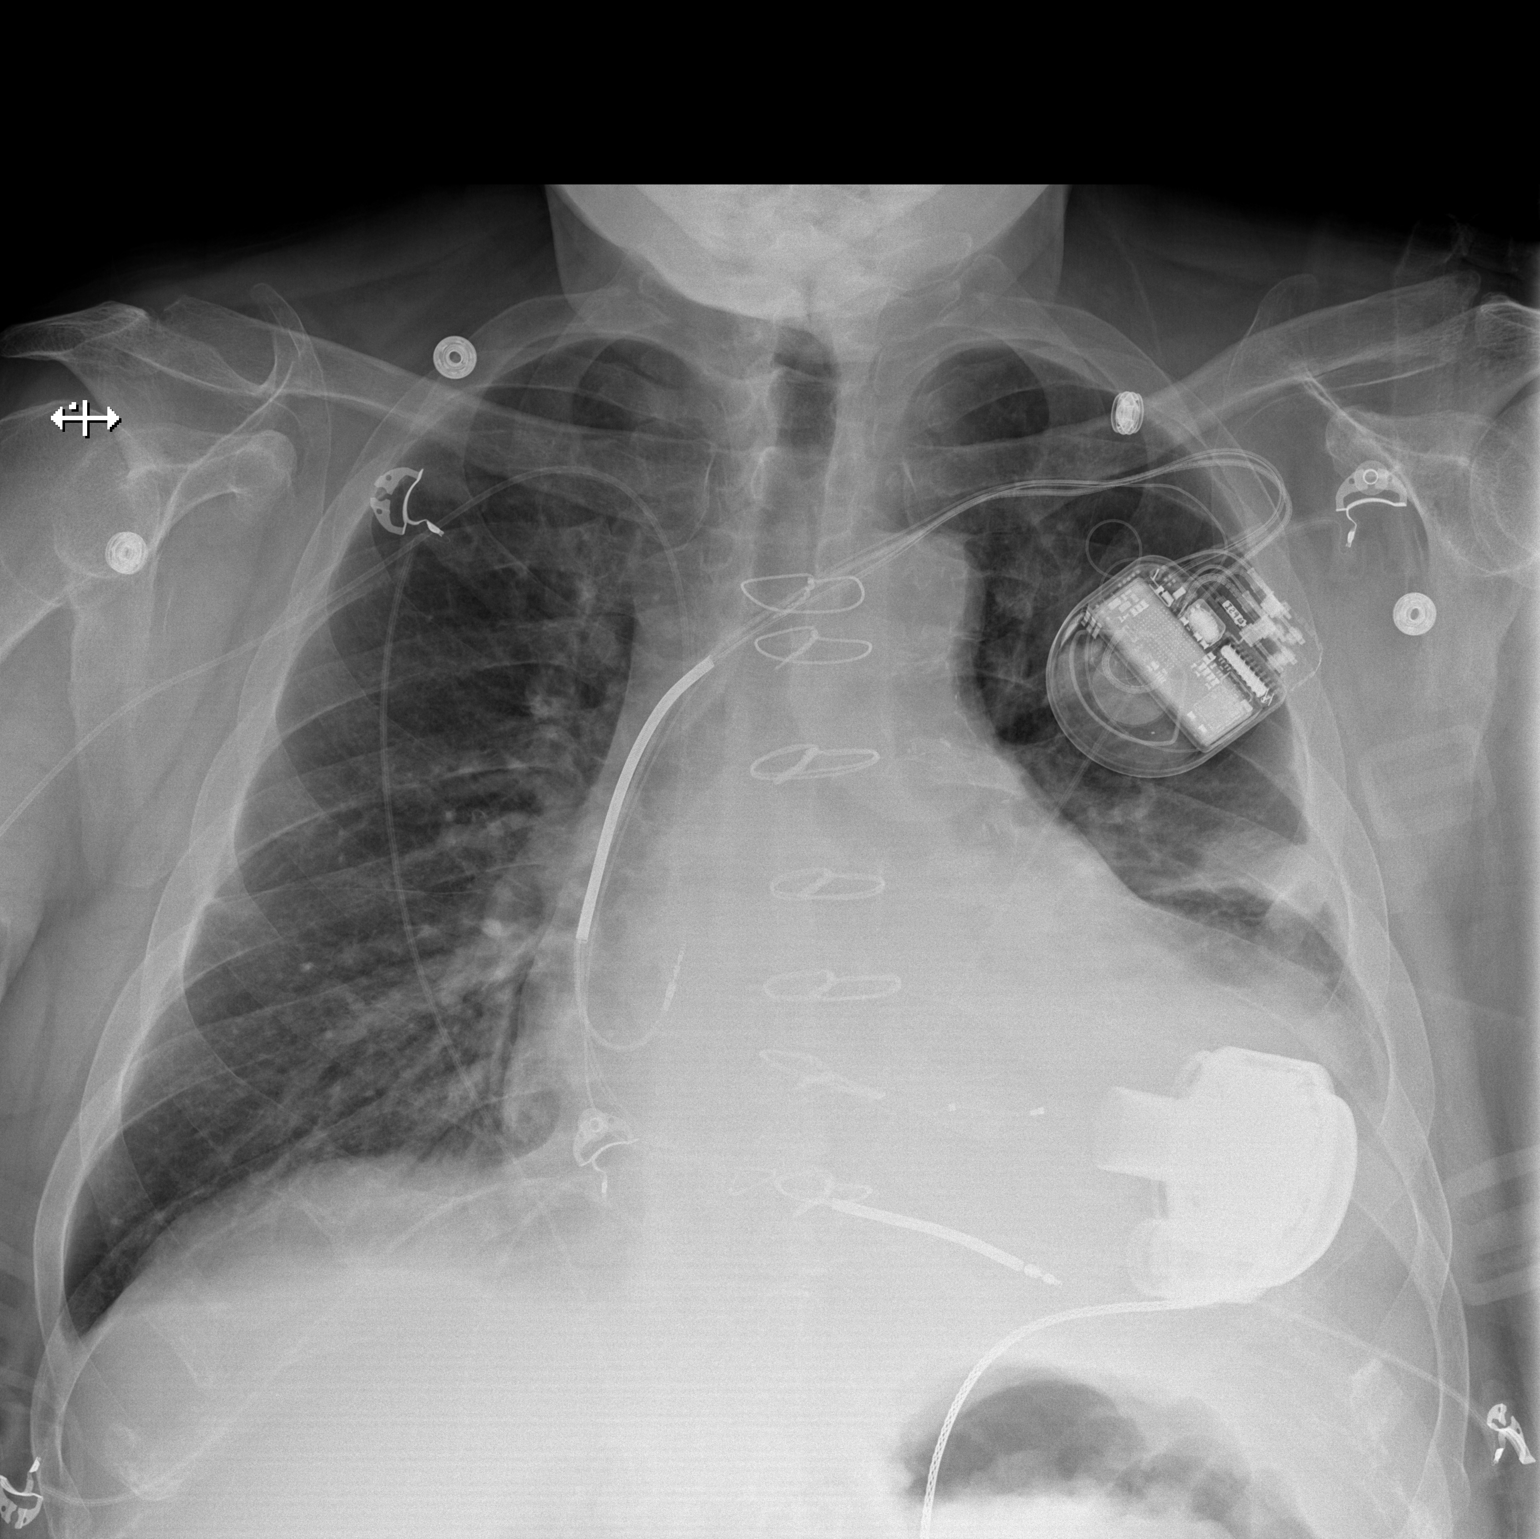

[w chest lat]
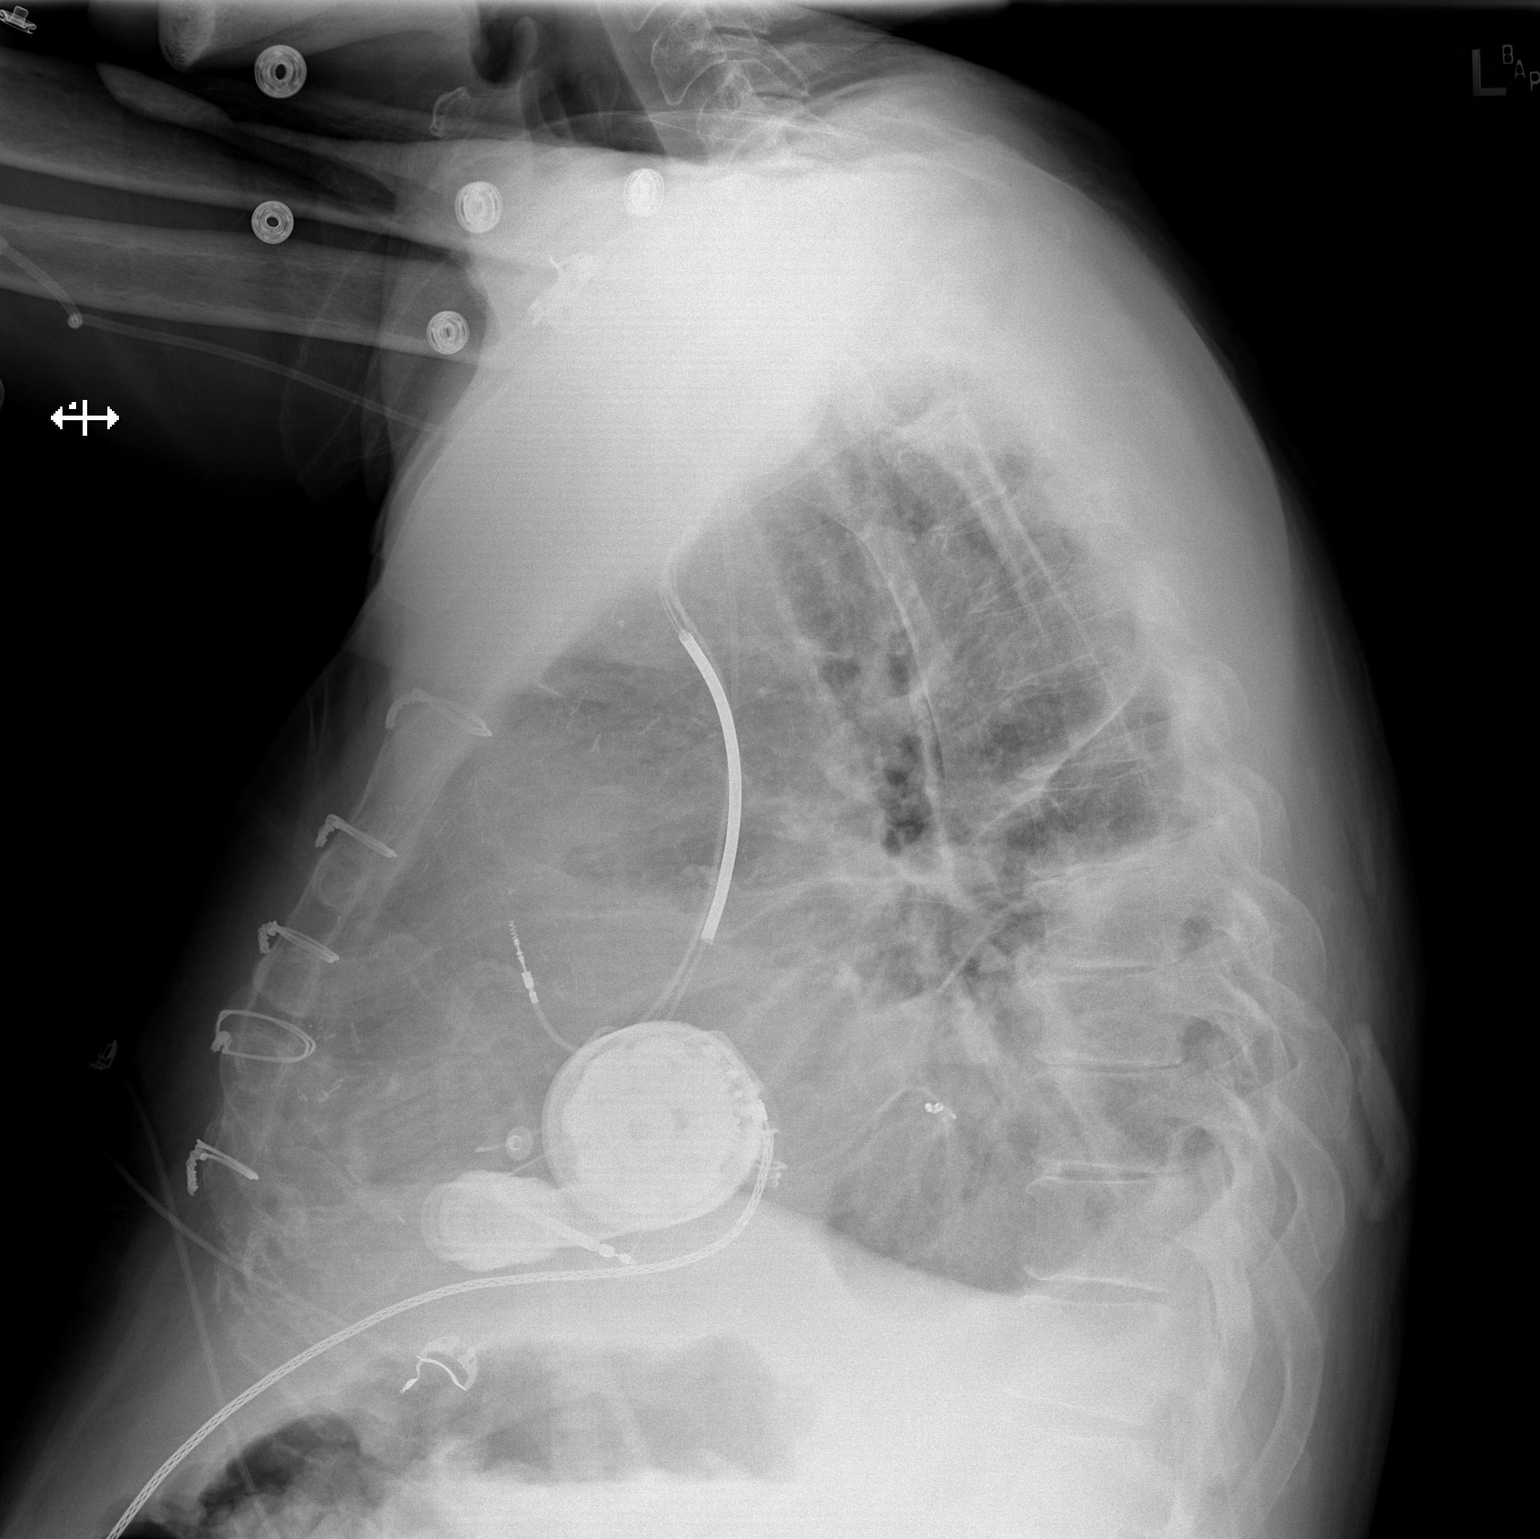

[2 of 2 positions shown; findings below may reference images not displayed]

FINDINGS: Support apparatus is stable, including LVID, dual lead cardiac
pacemaker and right-sided PICC line. Post sternotomy changes are
stable.

The cardiac silhouette is stably enlarged. Mediastinal contours
appear intact. Calcific atherosclerotic disease of the aorta is
noted.

There are small bilateral pleural effusions. There is mild
interstitial pulmonary edema. Focal airspace opacities seen of the
left lower lobe.

Osseous structures are without acute abnormality. Soft tissues are
grossly normal.
IMPRESSION: Stable enlargement of the cardiac silhouette.

Small bilateral pleural effusions with interstitial pulmonary edema.

Focal airspace consolidation in the left lower lobe, slightly
worsened.

## 2018-02-05 NOTE — Progress Notes (Signed)
Advanced Heart Failure Clinic Note    Referring Provider: Darylene Price, FNP Primary Care: [facility] Primary Cardiologist: Dr Haroldine Laws.  Transplant Center- [facility]  HPI: Shon Indelicato. is a 62 y.o. male with history of chronic systolic HF s/p Medtronic ICD 2014, CAD s/p CABG in 2000, HTN, Hx of cocaine abuse, Tobacco abuse, Depression, PAF, and COPD. HM3 placed 09/09/2016. In April 2018 he was placed on milrinone but later switched to dobutamine. He remains on dobutamine 7.33mcg.   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  06/01/2017- Capsule Endoscopy- normal except  tiny angiectasia, nonbleeding in the right colon.  Admitted 2/6-2/18/2019 with recurrent volume overload, RHF and ascites despite dobutamine support. Underwent paracentesis on 2/7 and 2/12. He had over 11 liters removed from paracentesis. Diuresed with IV lasix and transitioned back to home torsemide 100 mg/50mg  daily. Overall he lost 47 pounds.  Also treated for hepatic encephalopathy. MAPs were soft with diuresis so midodrine increased to 10 tid. Weight on d/c 195 pounds.   Admitted from clinic 01/10/18-01/27/18 with encephalopathy and acute on chronic CHF. Pt had somehow disconnected his dobutamine.  The line was "fractured" in 2 places. Weight up 30+ pounds from baseline. Dobutamine restarted and patient diuresed with lasix gtt and metolazone. Diamox added. Pt required Paracentesis 4/10 AND 01/16/18 with 4.8L and 2.4 L off, respectively.   Overall, Pt down 31 lb from admit in setting of diuresis and paracentesis. Transitioned back to torsemide 100 mg BID with metolazone as needed. Also had recurrent hepatic encephalopathy on admit.  Ammonia elevated at 72 on admit lactulose increase to 45 tid and improved.  Hospital course complicated by unwitnessed fall 01/2015. CT of head negative as well as self-dislodgment of tunneled PICC line that was replaced  by IR. Pt also noted to have hyponatremia down to 120. Started on Demeclocycline 150 mg BID and free water restricted. This was later stopped as sodium improved. Discharged to [facility] as it was felt he was unable to care for himself. Discharge weight 189 pounds.  Returns for post-hospital LVAD follow up. Remains on dobuatmine 7.5 for RV support and midodrine for BP support. Says he is unhappy at [facility] and wants to go home and his younger sister will look after him. Taking all meds as prescribed with help of staff. Weight up a few pounds but weighed with equipment. Denies COB or CP. Denies orthopnea or PND. No fevers, chills or problems with driveline. No bleeding, melena or neuro symptoms. No VAD alarms. No recurrent falls.   VAD Indication: Destination Therapy - eval complete at [facility]  VAD interrogation & Equipment Management: Speed:5800 Flow: 5.4 Power:4.5 w PI:2.5 Hct: 35  Alarms: one "no external power alarm" - pt reports he accidentally pulled MPU out of wall socket Events: 0 - 5 daily  Fixed speed 5800 Low speed limit: 5500  Primary Controller: Replace back up battery in 23 months. Back up controller:  Pt left black bag in car  Annual Equipment Maintenance on UBC/PM was performed on 09/2017.    Past Medical History:  Diagnosis Date  . AICD (automatic cardioverter/defibrillator) present   . Anxiety   . ASCVD (arteriosclerotic cardiovascular disease)   . Chronic systolic CHF (congestive heart failure) ([place])   . COPD (chronic obstructive pulmonary disease) ([place])   . Coronary artery disease   . Depression   . GI bleed   . High cholesterol   . History  of blood transfusion   . History of cocaine abuse   . Hypertension   . LVAD (left ventricular assist device) present ([employer])   . Presence of permanent cardiac pacemaker   . Shortness of breath dyspnea   . Suicidal ideation   . Tobacco abuse   . Type II diabetes mellitus ([place])     Current Outpatient  Medications  Medication Sig Dispense Refill  . acetaminophen (TYLENOL) 500 MG tablet Take 1,000 mg by mouth every 6 (six) hours as needed for moderate pain or headache.    Marland Kitchen amiodarone (PACERONE) 200 MG tablet TAKE ONE TABLET BY MOUTH ONCE DAILY (Patient taking differently: TAKE ONE TABLET (200mg ) BY MOUTH ONCE DAILY) 60 tablet 6  . citalopram (CELEXA) 20 MG tablet Take 1 tablet (20 mg total) by mouth daily. 30 tablet 6  . cyclobenzaprine (FLEXERIL) 10 MG tablet Take 1 tablet (10 mg total) by mouth 3 (three) times daily as needed for muscle spasms. 30 tablet 1  . digoxin (DIGOX) 0.125 MG tablet Take 1 tablet (0.125 mg total) daily by mouth. 30 tablet 6  . DOBUTamine (DOBUTREX) 4-5 MG/ML-% infusion Inject 714.75 mcg/min into the vein continuous. PER [facility] pharmacy 250 mL 11  . docusate sodium (COLACE) 100 MG capsule Take 200 mg by mouth at bedtime as needed for mild constipation.     . gabapentin (NEURONTIN) 300 MG capsule Take 1 capsule (300 mg total) by mouth 2 (two) times daily. 180 capsule 6  . insulin aspart (NOVOLOG FLEXPEN) 100 UNIT/ML FlexPen Inject 5 Units into the skin 3 (three) times daily with meals. 15 mL 11  . Insulin Glargine (LANTUS SOLOSTAR) 100 UNIT/ML Solostar Pen Inject 10 Units into the skin daily at 10 pm. 1 pen 6  . lactulose (CHRONULAC) 10 GM/15ML solution Take 67.5 mLs (45 g total) by mouth 3 (three) times daily. 240 mL 6  . magnesium oxide (MAG-OX) 400 (241.3 Mg) MG tablet Take 1 tablet (400 mg total) by mouth daily. 30 tablet 6  . metolazone (ZAROXOLYN) 2.5 MG tablet Take 2.5 mg by mouth See admin instructions. Take one tablet (2.5mg ) by mouth on Fridays and Mondays.    . midodrine (PROAMATINE) 10 MG tablet Take 1 tablet (10 mg total) by mouth 3 (three) times daily with meals. 90 tablet 6  . Multiple Vitamin (MULTIVITAMIN WITH MINERALS) TABS tablet Take 1 tablet by mouth daily.    . ondansetron (ZOFRAN ODT) 4 MG disintegrating tablet Take 1 tablet (4 mg total) by mouth every  8 (eight) hours as needed for nausea. 6 tablet 0  . pantoprazole (PROTONIX) 40 MG tablet TAKE 1 TABLET BY MOUTH TWICE DAILY WITH A MEAL (Patient taking differently: TAKE 1 TABLET (40mg ) BY MOUTH TWICE DAILY WITH A MEAL) 60 tablet 6  . potassium chloride SA (K-DUR,KLOR-CON) 20 MEQ tablet Take 2 tablets (40 mEq total) by mouth 2 (two) times daily. Take 40 meq in am and 20 meq in pm (Patient taking differently: Take 20-40 mEq by mouth See admin instructions. Take 40 meq in am and 20 meq in pm) 120 tablet 3  . senna-docusate (SENOKOT S) 8.6-50 MG tablet Take 2 tablets by mouth at bedtime as needed for mild constipation. (Patient not taking: Reported on 01/10/2018) 30 tablet 6  . spironolactone (ALDACTONE) 25 MG tablet Take 1 tablet (25 mg total) by mouth daily. 30 tablet 6  . sucralfate (CARAFATE) 1 GM/10ML suspension Take 10 mLs (1 g total) by mouth 4 (four) times daily -  with  meals and at bedtime. 420 mL 6  . torsemide (DEMADEX) 100 MG tablet Take 1 tablet (100 mg total) by mouth 2 (two) times daily. Take 100mg  in the morning and 50mg  in the evening 60 tablet 6  . traZODone (DESYREL) 50 MG tablet Take 1 tablet (50 mg total) by mouth at bedtime as needed for sleep. 30 tablet 6  . warfarin (COUMADIN) 5 MG tablet Take 5-7.5 mg by mouth See admin instructions. Take 1 and 1/2 tablets (7.5 mg) daily except 1 tablet (5 mg) on Monday and Friday      No current facility-administered medications for this encounter.     Chlorhexidine gluconate; Codeine; Lipitor [atorvastatin]; and Tape  REVIEW OF SYSTEMS: All systems negative except as listed in HPI, PMH and Problem list.     Vitals:   02/03/18 1048  BP: 115/66  Pulse: 74    Wt Readings from Last 3 Encounters:  02/03/18 208 lb 12.8 oz (94.7 kg)  01/27/18 189 lb 6.4 oz (85.9 kg)  12/07/17 204 lb 3.2 oz (92.6 kg)     Vital Signs:  Doppler Pressure: UTO           Automatc BP:  115/66 (79) HR: 74 SPO2:unable to obtain %  Weight: 208.8 lbs w/o  eqt Last weight: 204.2 lbs last clinic visit Home weights: 203 lbs this AM   Physical exam: General:  NAD. No tremors HEENT: normal anicteric Neck: supple. JVP 9-10.  Carotids 2+ bilat; no bruits. No lymphadenopathy or thryomegaly appreciated. Cor: LVAD hum.  R tunneled PICC Lungs: Clear. Abdomen: obese soft, nontender, mildly distended. No hepatosplenomegaly. No bruits or masses. Good bowel sounds. Driveline site clean. Anchor in place.  Extremities: no cyanosis, clubbing, rash. Warm. Trace edema  Neuro: alert & oriented x 3. No focal deficits. Moves all 4 without problem  No asterixis   ASSESSMENT AND PLAN: 1. Chronic end-stage biventricular systolic HF: LVEF 35% due to ICM -> s/p Echo 08/24/16 LVEF 15%, RV mild dilated, moderately reduced. 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  Has been turned down for transplant at [facility].  - Complicated by RV failure. Failed milrinone due to persistent RV failure/shock..   - Remains on dobutamine 7.5 mcg.  - He is s/p recent prolonged hospitalization due to recurrent HF with marked volume overload and hepatic encephalopathy in setting of disconnecting his own dobutamine line and medication noncomplaince. Hospitalization c/b unwitnessed fall and self d/c of tunneled PICC. - He is unhappy with SNF but I told him that he has clearly reached the point where he can no longer take care of himself without harm. We will need to connect with his sister and see how much care she can provide him and if he will be safe in that environment. For now, will need to continue SNF,  - NYHA III-IIIB  - Volume status relatively stable after inpatient diuresis. Continue torsemide 100 bid and metolazone 2.5 every Monday and Friday as needed - Continue midodrine 10 TID for low MAPs and cirrhotic physiology.   - Renal function stable. Creatinine 1.18 today. K ok - Off sildenafil due to hypotension - No beta blocker with RV failure.  - VAD interrogated personally. Parameters stable. -  Warfarin with goal INR 2.0 - 2.5 + ASA 81 daily. INR 1.95  Discussed dosing with PharmD personally. - Driveline site ok - [id number] - Not transplant candidate due to social issues, noncompliance and cirrhosis (has been turned down by [facility]) - Likely nearing time for Hospice involvement 2.  CAD: Severe 3v- CAD s/p CABG with occluded grafts as above except for LIMA.  -No s/s ischemia. Continue current therapy.  3. DMII:  - Per PCP - HgbA1c has improved 4. Tobacco abuse:  - Reports complete cessation 5. Atrial fibrillation, paroxysmal - Remains in NSR  Continue amio 200 daily. Careful with cirrhosis  6. Left pleural effusion - underwent repeat thoracentesis in Jan. 2018.  - no recurrence. No effusion on exam today  7.  Anxiety/Depression - Continue Celexa 20mg  daily . - Social issues still a concern  8. Cirrhosis with hepatic encephalopathy - likely combination ETOH and chronic RV failure - No asterixis today - Continue lactulose at 45 tid - Can repeat paracentesis as needed for symptomatic ascites 10. Anemia, iron-deficiency:   - Has had GI work up with EGD and Capsule Endoscopy showing severe gastropathy.Hgb 8.9 todau No evidence of active bleeding. Continue carafate.  Total time spent 45 minutes. Over half that time spent discussing above.   Glori Bickers  MD  3:19 PM

## 2018-02-06 ENCOUNTER — Telehealth: Payer: Self-pay | Admitting: Licensed Clinical Social Worker

## 2018-02-06 NOTE — Telephone Encounter (Signed)
CSW contacted patient at SNF to discuss concerns withy discharge plan. Patient states he plans to return home alone to his apartment. CSW discussed at length concerns of safety and ability to manage his care independently at home. Patient was adamant about going home and states "my sister doesn't have room for me". Patient gave permission for CSW to contact sister to discuss further.  CSW contacted patient's sister terri and discussed concerns at length. Terri shared the same concerns about patient's ability to care for self and states she was told by SNF staff today that Hospice care may be needed. Sister asked multiple questions regarding care needs and prognosis for patient. CSW suggested family meeting with patient and family to meet with VAD team to discuss concerns, safety and discharge plan. Sister states she will gather the family and Thursday at 12 noon would work for her schedule. CSW will pass on to VAD team and make arrangements for family meeting. CSW called patient back to share plan of meeting and VAD clinic appointment on Thursday @ 12 noon. Raquel Sarna, [place], [place]

## 2018-02-08 ENCOUNTER — Other Ambulatory Visit (HOSPITAL_COMMUNITY): Payer: Self-pay | Admitting: Unknown Physician Specialty

## 2018-02-08 DIAGNOSIS — Z7901 Long term (current) use of anticoagulants: Secondary | ICD-10-CM

## 2018-02-08 DIAGNOSIS — Z95811 Presence of heart assist device: Secondary | ICD-10-CM

## 2018-02-09 ENCOUNTER — Ambulatory Visit (HOSPITAL_COMMUNITY): Payer: Self-pay | Admitting: Pharmacist

## 2018-02-09 ENCOUNTER — Ambulatory Visit (HOSPITAL_COMMUNITY)
Admission: RE | Admit: 2018-02-09 | Discharge: 2018-02-09 | Disposition: A | Discharge status: Home / Self Care | Payer: Medicare HMO | Source: Ambulatory Visit | Attending: Internal Medicine | Admitting: Internal Medicine

## 2018-02-09 DIAGNOSIS — Z794 Long term (current) use of insulin: Secondary | ICD-10-CM | POA: Insufficient documentation

## 2018-02-09 DIAGNOSIS — Z95810 Presence of automatic (implantable) cardiac defibrillator: Secondary | ICD-10-CM | POA: Diagnosis not present

## 2018-02-09 DIAGNOSIS — Z7901 Long term (current) use of anticoagulants: Secondary | ICD-10-CM | POA: Insufficient documentation

## 2018-02-09 DIAGNOSIS — Z951 Presence of aortocoronary bypass graft: Secondary | ICD-10-CM | POA: Insufficient documentation

## 2018-02-09 DIAGNOSIS — I480 Paroxysmal atrial fibrillation: Secondary | ICD-10-CM | POA: Insufficient documentation

## 2018-02-09 DIAGNOSIS — J90 Pleural effusion, not elsewhere classified: Secondary | ICD-10-CM | POA: Diagnosis not present

## 2018-02-09 DIAGNOSIS — I5022 Chronic systolic (congestive) heart failure: Secondary | ICD-10-CM | POA: Insufficient documentation

## 2018-02-09 DIAGNOSIS — J449 Chronic obstructive pulmonary disease, unspecified: Secondary | ICD-10-CM | POA: Insufficient documentation

## 2018-02-09 DIAGNOSIS — Z95811 Presence of heart assist device: Secondary | ICD-10-CM

## 2018-02-09 DIAGNOSIS — R188 Other ascites: Secondary | ICD-10-CM | POA: Diagnosis not present

## 2018-02-09 DIAGNOSIS — I110 Hypertensive heart disease with heart failure: Secondary | ICD-10-CM | POA: Diagnosis not present

## 2018-02-09 DIAGNOSIS — E7800 Pure hypercholesterolemia, unspecified: Secondary | ICD-10-CM | POA: Insufficient documentation

## 2018-02-09 DIAGNOSIS — K7290 Hepatic failure, unspecified without coma: Secondary | ICD-10-CM | POA: Insufficient documentation

## 2018-02-09 DIAGNOSIS — Z79899 Other long term (current) drug therapy: Secondary | ICD-10-CM | POA: Diagnosis not present

## 2018-02-09 DIAGNOSIS — F329 Major depressive disorder, single episode, unspecified: Secondary | ICD-10-CM | POA: Insufficient documentation

## 2018-02-09 DIAGNOSIS — W19XXXA Unspecified fall, initial encounter: Secondary | ICD-10-CM | POA: Insufficient documentation

## 2018-02-09 DIAGNOSIS — D509 Iron deficiency anemia, unspecified: Secondary | ICD-10-CM | POA: Diagnosis not present

## 2018-02-09 DIAGNOSIS — F1410 Cocaine abuse, uncomplicated: Secondary | ICD-10-CM | POA: Insufficient documentation

## 2018-02-09 DIAGNOSIS — F419 Anxiety disorder, unspecified: Secondary | ICD-10-CM | POA: Insufficient documentation

## 2018-02-09 DIAGNOSIS — E119 Type 2 diabetes mellitus without complications: Secondary | ICD-10-CM | POA: Diagnosis not present

## 2018-02-09 DIAGNOSIS — I2510 Atherosclerotic heart disease of native coronary artery without angina pectoris: Secondary | ICD-10-CM | POA: Diagnosis not present

## 2018-02-09 DIAGNOSIS — K7460 Unspecified cirrhosis of liver: Secondary | ICD-10-CM | POA: Diagnosis not present

## 2018-02-09 LAB — CBC
HCT: 28.2 % — ABNORMAL LOW (ref 39.0–52.0)
HEMOGLOBIN: 8.4 g/dL — AB (ref 13.0–17.0)
MCH: 23.3 pg — ABNORMAL LOW (ref 26.0–34.0)
MCHC: 29.8 g/dL — ABNORMAL LOW (ref 30.0–36.0)
MCV: 78.3 fL (ref 78.0–100.0)
PLATELETS: 152 10*3/uL (ref 150–400)
RBC: 3.6 MIL/uL — ABNORMAL LOW (ref 4.22–5.81)
RDW: 18.2 % — ABNORMAL HIGH (ref 11.5–15.5)
WBC: 5.6 10*3/uL (ref 4.0–10.5)

## 2018-02-09 LAB — BASIC METABOLIC PANEL
ANION GAP: 9 (ref 5–15)
BUN: 28 mg/dL — AB (ref 6–20)
CHLORIDE: 88 mmol/L — AB (ref 101–111)
CO2: 32 mmol/L (ref 22–32)
Calcium: 8.3 mg/dL — ABNORMAL LOW (ref 8.9–10.3)
Creatinine, Ser: 1.43 mg/dL — ABNORMAL HIGH (ref 0.61–1.24)
GFR calc Af Amer: 60 mL/min — ABNORMAL LOW (ref >60)
GFR calc non Af Amer: 51 mL/min — ABNORMAL LOW (ref >60)
Glucose, Bld: 239 mg/dL — ABNORMAL HIGH (ref 65–99)
POTASSIUM: 3.8 mmol/L (ref 3.5–5.1)
SODIUM: 129 mmol/L — AB (ref 135–145)

## 2018-02-09 LAB — PROTIME-INR
INR: 2.6
Prothrombin Time: 27.6 seconds — ABNORMAL HIGH (ref 11.4–15.2)

## 2018-02-09 LAB — LACTATE DEHYDROGENASE: LDH: 129 U/L (ref 98–192)

## 2018-02-09 LAB — AMMONIA: Ammonia: 62 umol/L — ABNORMAL HIGH (ref 9–35)

## 2018-02-09 IMAGING — DX DG CHEST 2V
2 series · 2 of 2 positions shown · non-contrast
Comparison: PA and lateral chest x-ray September 17, 2016

CLINICAL DATA: Ten days status post left ventricular assist device
placement. Persistent chest soreness but no other complaints.
Follow-up radiograph.

EXAM:
CHEST  2 VIEW

[chest pa]
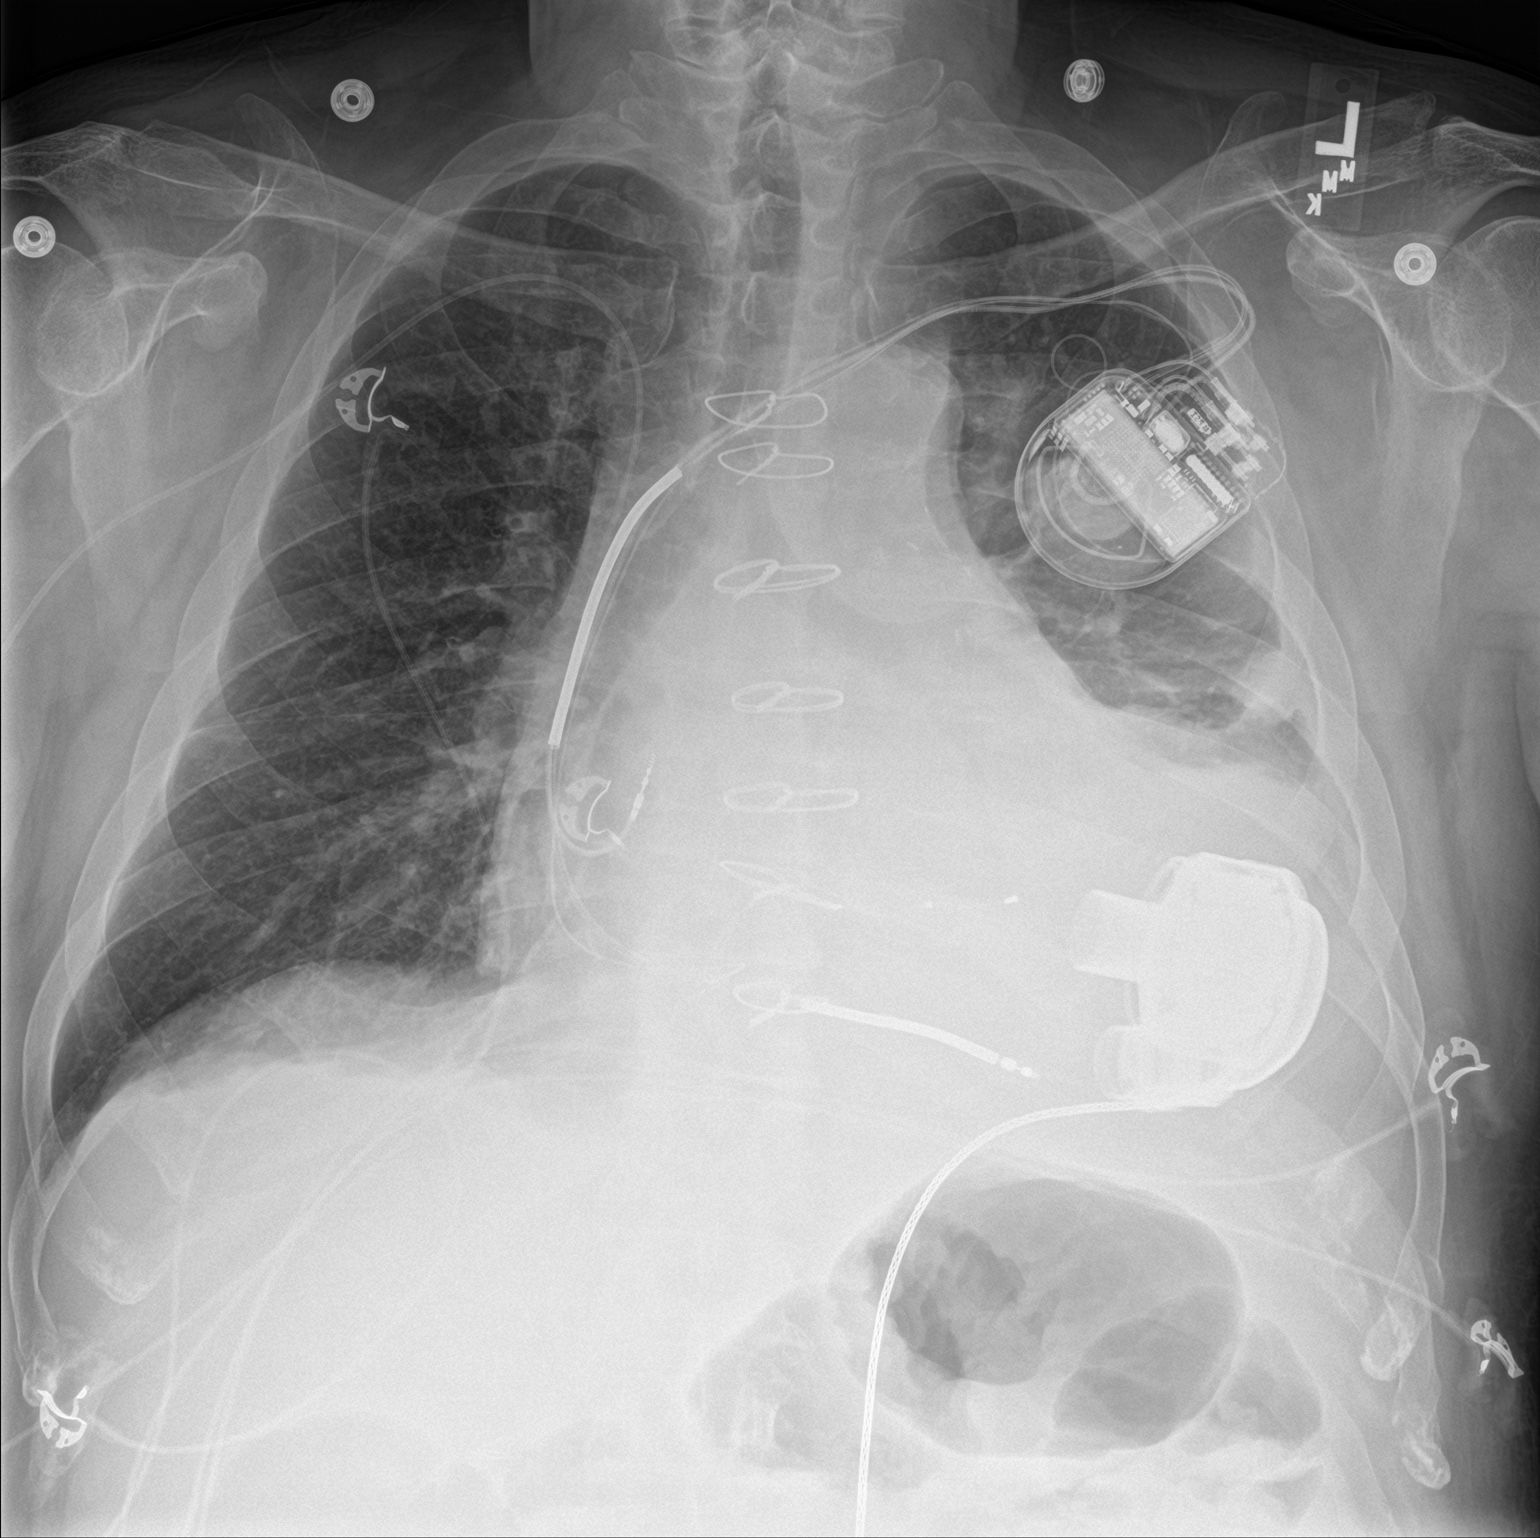

[chest lat]
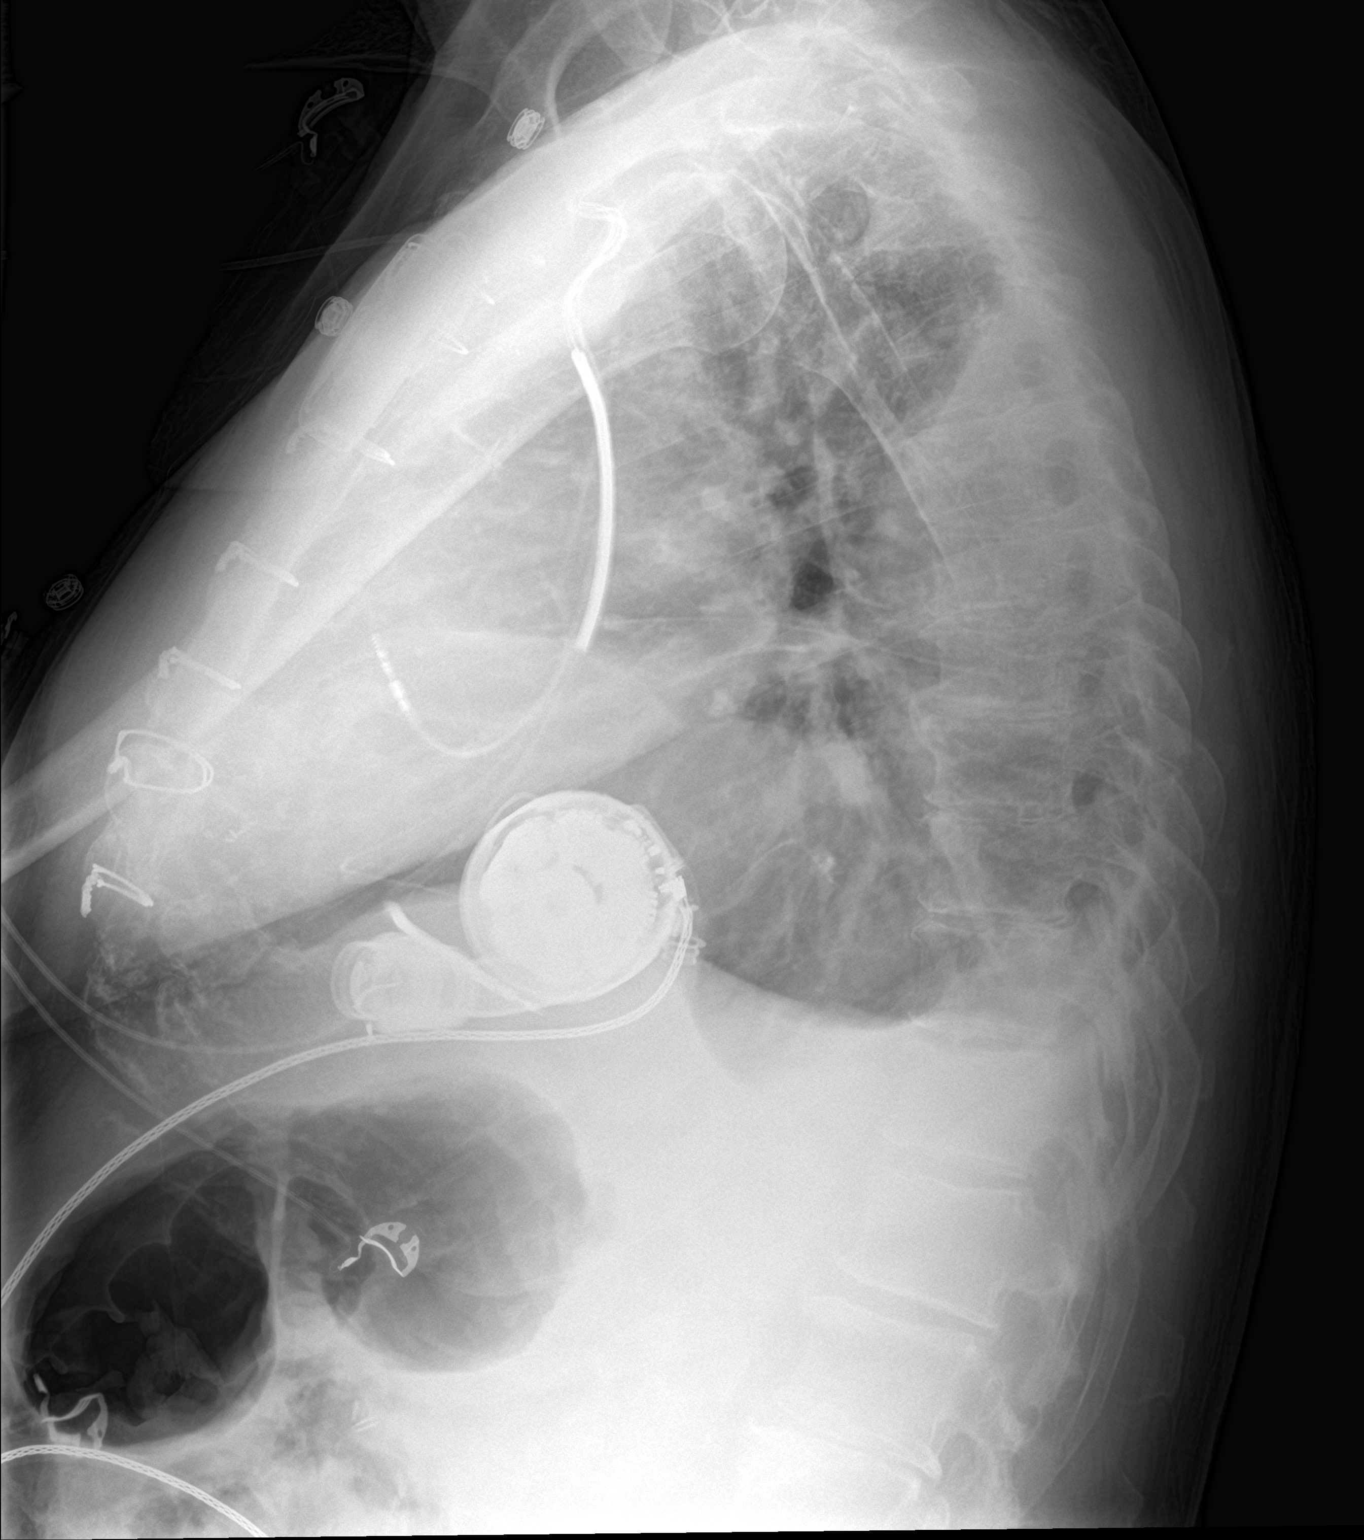

[2 of 2 positions shown; findings below may reference images not displayed]

FINDINGS: The right lung is well-expanded and clear. On the left the
retrocardiac region remains dense. There is a small amount of
pleural fluid at the layering posteriorly on the left and a smaller
amount layering posteriorly on the right. The cardiac silhouette
remains enlarged. The pulmonary vascularity is not engorged. The
left ventricular assist device is in stable position. The ICD is
also in stable position. The sternal wires are intact. The thoracic
spine exhibits multilevel degenerative disc space narrowing.
IMPRESSION: Stable appearance of the chest since the study of 3 days ago. No
significant pulmonary edema. Persistent small left pleural effusion
and trace right pleural effusion. Basilar atelectasis or pneumonia.
The ICD and left ventricular assist device appear to be in stable
position.

## 2018-02-09 NOTE — Progress Notes (Signed)
Advanced Heart Failure Clinic Note    Referring Provider: Darylene Price, FNP Primary Care: [facility] Primary Cardiologist: Dr Haroldine Laws.  Transplant Center- [facility]  HPI: Ronald Miller. is a 62 y.o. male with history of chronic systolic HF s/p Medtronic ICD 2014, CAD s/p CABG in 2000, HTN, Hx of cocaine abuse, Tobacco abuse, Depression, PAF, and COPD. HM3 placed 09/09/2016. In April 2018 he was placed on milrinone but later switched to dobutamine. He remains on dobutamine 7.89mcg.   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  06/01/2017- Capsule Endoscopy- normal except  tiny angiectasia, nonbleeding in the right colon.  Admitted 2/6-2/18/2019 with recurrent volume overload, RHF and ascites despite dobutamine support. Underwent paracentesis on 2/7 and 2/12. He had over 11 liters removed from paracentesis. Diuresed with IV lasix and transitioned back to home torsemide 100 mg/50mg  daily. Overall he lost 47 pounds.  Also treated for hepatic encephalopathy. MAPs were soft with diuresis so midodrine increased to 10 tid. Weight on d/c 195 pounds.   Admitted from clinic 01/10/18-01/27/18 with encephalopathy and acute on chronic CHF. Pt had somehow disconnected his dobutamine.  The line was "fractured" in 2 places. Weight up 30+ pounds from baseline. Dobutamine restarted and patient diuresed with lasix gtt and metolazone. Diamox added. Pt required Paracentesis 4/10 AND 01/16/18 with 4.8L and 2.4 L off, respectively.   Overall, Pt down 31 lb from admit in setting of diuresis and paracentesis. Transitioned back to torsemide 100 mg BID with metolazone as needed. Also had recurrent hepatic encephalopathy on admit.  Ammonia elevated at 72 on admit lactulose increase to 45 tid and improved.  Hospital course complicated by unwitnessed fall 01/2015. CT of head negative as well as self-dislodgment of tunneled PICC line that was replaced  by IR. Pt also noted to have hyponatremia down to 120. Started on Demeclocycline 150 mg BID and free water restricted. This was later stopped as sodium improved. Discharged to [facility] as it was felt he was unable to care for himself. Discharge weight 189 pounds.  Returns for post-hospital LVAD follow up. We saw him lat week for first visit after prolonged hospitalization. Remains on dobuatmine 7.5 for RV support and midodrine for BP support. Continues to say he is unhappy at [facility] and wants to go home and his younger sister will look after him. Complains about lack of care at some points as well as the food and drink they give him. Says breathing is ok but doesn't do much. No CP, orthopnea or PND. Accidentally got foot stuck under the door but did not fall.Taking all meds as prescribed with help of staff. Weight up 4 pounds according to him. Denies orthopnea or PND. No fevers, chills or problems with driveline. No bleeding, melena or neuro symptoms. No VAD alarms. Taking all meds as prescribed.   VAD Indication: Destination Therapy - eval complete at [facility]  VAD interrogation & Equipment Management: Speed:5800 Flow: 5.4 Power:4.5 w PI: 2.4 Hct: 28  Alarms: none Events: 0 - 10 daily  Fixed speed 5800 Low speed limit: 5500  Primary Controller: Replace back up battery in 20 months. Back up controller:  pt did not bring black bag  Annual Equipment Maintenance on UBC/PM was performed on 09/2017.    Past Medical History:  Diagnosis Date  . AICD (automatic cardioverter/defibrillator) present   . Anxiety   . ASCVD (arteriosclerotic cardiovascular disease)   . Chronic systolic CHF (congestive  heart failure) ([place])   . COPD (chronic obstructive pulmonary disease) ([place])   . Coronary artery disease   . Depression   . GI bleed   . High cholesterol   . History of blood transfusion   . History of cocaine abuse   . Hypertension   . LVAD (left ventricular assist device)  present ([place])   . Presence of permanent cardiac pacemaker   . Shortness of breath dyspnea   . Suicidal ideation   . Tobacco abuse   . Type II diabetes mellitus ([place])     Current Outpatient Medications  Medication Sig Dispense Refill  . acetaminophen (TYLENOL) 500 MG tablet Take 1,000 mg by mouth every 6 (six) hours as needed for moderate pain or headache.    Marland Kitchen amiodarone (PACERONE) 200 MG tablet TAKE ONE TABLET BY MOUTH ONCE DAILY 60 tablet 6  . citalopram (CELEXA) 20 MG tablet Take 1 tablet (20 mg total) by mouth daily. 30 tablet 6  . cyclobenzaprine (FLEXERIL) 10 MG tablet Take 1 tablet (10 mg total) by mouth 3 (three) times daily as needed for muscle spasms. 30 tablet 1  . digoxin (DIGOX) 0.125 MG tablet Take 1 tablet (0.125 mg total) daily by mouth. 30 tablet 6  . DOBUTamine (DOBUTREX) 4-5 MG/ML-% infusion Inject 714.75 mcg/min into the vein continuous. PER [facility] pharmacy 250 mL 11  . docusate sodium (COLACE) 100 MG capsule Take 200 mg by mouth at bedtime as needed for mild constipation.     . gabapentin (NEURONTIN) 300 MG capsule Take 1 capsule (300 mg total) by mouth 2 (two) times daily. 180 capsule 6  . insulin aspart (NOVOLOG FLEXPEN) 100 UNIT/ML FlexPen Inject 5 Units into the skin 3 (three) times daily with meals. 15 mL 11  . Insulin Glargine (LANTUS SOLOSTAR) 100 UNIT/ML Solostar Pen Inject 10 Units into the skin daily at 10 pm. 1 pen 6  . lactulose (CHRONULAC) 10 GM/15ML solution Take 67.5 mLs (45 g total) by mouth 3 (three) times daily. 240 mL 6  . magnesium oxide (MAG-OX) 400 (241.3 Mg) MG tablet Take 1 tablet (400 mg total) by mouth daily. 30 tablet 6  . midodrine (PROAMATINE) 10 MG tablet Take 1 tablet (10 mg total) by mouth 3 (three) times daily with meals. 90 tablet 6  . Multiple Vitamin (MULTIVITAMIN WITH MINERALS) TABS tablet Take 1 tablet by mouth daily.    . ondansetron (ZOFRAN ODT) 4 MG disintegrating tablet Take 1 tablet (4 mg total) by mouth every 8 (eight) hours as  needed for nausea. 6 tablet 0  . pantoprazole (PROTONIX) 40 MG tablet TAKE 1 TABLET BY MOUTH TWICE DAILY WITH A MEAL 60 tablet 6  . potassium chloride SA (K-DUR,KLOR-CON) 20 MEQ tablet Take 2 tablets (40 mEq total) by mouth 2 (two) times daily. Take 40 meq in am and 20 meq in pm 120 tablet 3  . senna-docusate (SENOKOT S) 8.6-50 MG tablet Take 2 tablets by mouth at bedtime as needed for mild constipation. 30 tablet 6  . spironolactone (ALDACTONE) 25 MG tablet Take 1 tablet (25 mg total) by mouth daily. 30 tablet 6  . sucralfate (CARAFATE) 1 GM/10ML suspension Take 10 mLs (1 g total) by mouth 4 (four) times daily -  with meals and at bedtime. 420 mL 6  . torsemide (DEMADEX) 100 MG tablet Take 1 tablet (100 mg total) by mouth 2 (two) times daily. Take 100mg  in the morning and 50mg  in the evening 60 tablet 6  . traZODone (DESYREL)  50 MG tablet Take 1 tablet (50 mg total) by mouth at bedtime as needed for sleep. 30 tablet 6  . warfarin (COUMADIN) 5 MG tablet Take 5-7.5 mg by mouth See admin instructions. Take 1 and 1/2 tablets (7.5 mg) daily except 1 tablet (5 mg) on Monday, Wednesday and Friday     . metolazone (ZAROXOLYN) 2.5 MG tablet Take 2.5 mg by mouth See admin instructions. Take one tablet (2.5mg ) by mouth on Fridays and Mondays.     No current facility-administered medications for this encounter.     Chlorhexidine gluconate; Codeine; Lipitor [atorvastatin]; and Tape  REVIEW OF SYSTEMS: All systems negative except as listed in HPI, PMH and Problem list.     There were no vitals filed for this visit.  Wt Readings from Last 3 Encounters:  02/03/18 208 lb 12.8 oz (94.7 kg)  01/27/18 189 lb 6.4 oz (85.9 kg)  12/07/17 204 lb 3.2 oz (92.6 kg)     Vital Signs:  Doppler Pressure: 60   Automatc BP:  99/46 (66) HR: 97 SPO2: 85 - 86%  Weight: 208.4 lbs w/o eqt Last weight: 208.8 lbs last clinic visit  Physical exam: General:  NAD.  HEENT: normal anicteric Neck: supple. JVP 9.   Carotids 2+ bilat; no bruits. No lymphadenopathy or thryomegaly appreciated. Cor: LVAD hum.  Lungs: Clear. Decreased throughout No wheeze Abdomen: obese soft, nontender, non-distended. No hepatosplenomegaly. No bruits or masses. Good bowel sounds. Driveline site clean. Anchor in place.  Extremities: no cyanosis, clubbing, rash. Warm trace edema  Neuro: alert & oriented x 3. No focal deficits. Moves all 4 without problem no astrerixis    ASSESSMENT AND PLAN: 1. Chronic end-stage biventricular systolic HF: LVEF 27% due to ICM -> s/p Echo 08/24/16 LVEF 15%, RV mild dilated, moderately reduced. 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  Has been turned down for transplant at [facility].  - Complicated by RV failure. Failed milrinone due to persistent RV failure/shock..   - Remains on dobutamine 7.5 mcg and midodrine. MAPs low in 60s but tolerating.  - He is s/p recent prolonged hospitalization due to recurrent HF with marked volume overload and hepatic encephalopathy in setting of disconnecting his own dobutamine line and medication noncomplaince. Hospitalization c/b unwitnessed fall and self d/c of tunneled PICC. - He continues to be frustrated about being at a SNF and complains about lack of care at times as well as the fluid and food they give him.  That said, I had a long talk with him and his family restating my belief that he can no longer take care of himself without harm. And that if he returns home I think his chancing of living 1 year are 50-50 if not less and that he would be at very high risk for readmission. He is OK with going back to SNF and willing to give it a go. We discussed possible need for hospice involvement down the road but that I didn't feel he was quite there yet.  - NYHA III-IIIB  - Volume status relatively stable after inpatient diuresis. Continue torsemide 100 bid and metolazone 2.5 every Monday and Friday as needed - Continue midodrine 10 TID for low MAPs and cirrhotic physiology.   - Renal  function stable. Creatinine 1.43 today. K 3.8 - Off sildenafil due to hypotension - No beta blocker with RV failure.  - VAD interrogated personally. Parameters stable. - Warfarin with goal INR 2.0 - 2.5 + ASA 81 daily. INR 2.60 Discussed dosing with PharmD personally. -  Driveline site ok - LDH 129 - VAD interrogated personally. Parameters stable. - Not transplant candidate due to social issues, noncompliance and cirrhosis (has been turned down by [facility]) 2. CAD: Severe 3v- CAD s/p CABG with occluded grafts as above except for LIMA.  -No s/s ischemia. Continue current therapy.  3. DMII:  - Per PCP 4. Tobacco abuse:  - Reports complete cessation 5. Atrial fibrillation, paroxysmal - Remains in NSR  Continue amio 200 daily. Careful with cirrhosis  6. Left pleural effusion - underwent repeat thoracentesis in Jan. 2018.  - no recurrence. No effusion on exam today  7.  Anxiety/Depression - Continue Celexa 20mg  daily . - Social issues still a concern  8. Cirrhosis with hepatic encephalopathy - likely combination ETOH and chronic RV failure - No asterixis today - Continue lactulose at 45 tid - Can repeat paracentesis as needed for symptomatic ascites 10. Anemia, iron-deficiency:   - Has had GI work up with EGD and Capsule Endoscopy showing severe gastropath No evidence of active bleeding. Continue carafate.  Total time spent 45 minutes. Over half that time spent discussing above.    Glori Bickers  MD  11:50 PM

## 2018-02-09 NOTE — Progress Notes (Addendum)
CSW met with patient, mother, father and sister along with VAD Coordinators and Dr. Haroldine Laws for a family meeting. Patient is currently residing at [facility] SNF for rehab after lengthy hospitalization.  A lengthy discussion about patient's medical course since VAD implant and concerns regarding adherence to plan of care and the impact on patient's prognosis was had with all present. Discussed goals of care and plan for future. Patient's options discussed from home with family or remain at SNF for long term. Patient repeatedly stated he does not want to be a burden on his family and states he would like to remain at [facility]. Family asked appropriate questions and discussed quality of life vs. quantity of life. Patient states at this time he wants to be around for his grandchild and will adhere to the plan of care. CSW provided supportive intervention and discussed briefly process for long term care. CSW left message for [facility] CSW  [phone] to return call to discuss further per patient's request. CSW will continue to follow for supportive needs and continued discussion around goals of care. Raquel Sarna, [place], [place]

## 2018-02-09 NOTE — Progress Notes (Signed)
Pt presents to clinic for his 1 week hospital follow up with family. Plan to discuss goals of care today.  Pt says Ritta Slot reported to him he was up 4 lbs overnight; Dr. Haroldine Laws updated.  Says he "caught" his left foot under door at [facility] with injury. He reports the swelling has resolved and was able to get shoe on, is hopefully out of wheelchair and looking forward to participating in more activities.   VAD history reveals patient has been sleeping on batteries, he confirms this. Says if he had controller holder/strap he would feel comfortable switching to power module. Pt provided with neck strap; pt agreed to use and place himself on power module at night.   Raquel Sarna, LCSW and Tanda Rockers, VAD Coordinator in room with pt, family, and Dr. Haroldine Laws for discussion.  Vital Signs:  Doppler Pressure: 60  Automatc BP:  99/46 (66) HR: 97 SPO2: 85 - 86%  Weight: 208.4 lbs w/o eqt Last weight: 208.8 lbs last clinic visit  VAD Indication: Destination Therapy - eval complete at [facility]  VAD interrogation & Equipment Management: Speed:5800 Flow: 5.4 Power:4.5 w    PI: 2.4 Hct: 28  Alarms: none Events: 0 - 10 daily  Fixed speed 5800 Low speed limit: 5500  Primary Controller:  Replace back up battery in 20 months. Back up controller:   pt did not bring black bag  Annual Equipment Maintenance on UBC/PM was performed on 09/2017.   I reviewed the LVAD parameters from today and compared the results to the patient's prior recorded data. LVAD interrogation was NEGATIVE for significant power changes, NEGATIVE for clinical alarms and STABLE for PI events/speed drops. No programming changes were made and pump is functioning within specified parameters. Pt is performing daily controller and system monitor self tests along with completing weekly and monthly maintenance for LVAD equipment.  LVAD equipment check completed and is in good working order. Back-up equipmentt  present today.   Exit Site Care: Existing VAD dressing removed and site care performed using sterile technique. Drive line exit site cleaned with Betadine swab x 1, rinsed with sterile saline, allowed to dry, and Sorbaview dressing with bio patch re-applied. Exit site healed and incorporated, the velour is fully implanted at exit site. No redness, tenderness, drainage, foul odor or rash noted. Drive line anchor re-applied.   Note to [facility] to NOT change dressing. VAD Coordinator will come to site next week to change dressing.   Significant Events on VAD Support:  11/30/16> Milrinone gtt at discharge 01/27/17>dobutamine at 5 mcg/kg/mingtt at discharge 03/08/17> RV failure 04/29/17> symptomatic anemia and melena. EGD revealed stomach ulcers. Carafate added; ASA stopped 05/31/17>Ongoing symptomatic anemia and melena; capsule study mild antral gastritis; poss tiny AVM right colon. Lower MAPS, Losartan cut back. 06/2017> admit with syncope, AMS, Dobutamine at 7.71mcg 11/09/17>Marked ascites, RV failure, and hepatic encephalopathy. Paracentesis on 2/7 and 2/12 with >11 liters fluid removed  Device:Protect Therapies: off; end of service 08/2016 Last check: complete interrogation 05/26/2017. DDD (lower rate 60); BiV pacing with 96% AS-VP  BP & Labs:  MAP 60 - Doppler is reflecting MAP  Hgb 8.4 - No S/S of bleeding. Specifically denies melena/BRBPR or nosebleeds.  LDH stable at 129 with established baseline of 150- 250. Denies tea-colored urine. No power elevations noted on interrogation.   Patient Instructions: 1. Decrease Coumadin to 7.5 mg daily except 5 mg on MWF 2. Return to clinic in 2 weeks.   Zada Girt RN [place] Coordinator   Office: [phone]  24/7 Emergency VAD Pager: [phone]

## 2018-02-10 ENCOUNTER — Encounter (HOSPITAL_COMMUNITY): Payer: Self-pay

## 2018-02-10 IMAGING — CT CT CHEST W/O CM
2 of 3 series · 10 of 36 positions shown, 12 images · non-contrast
Comparison: 09/04/2016

CLINICAL DATA: LEFT ventricular assist device, LEFT pleural
effusion, shortness of breath, history hypertension, diabetes
mellitus, smoking, chronic systolic CHF, coronary artery disease

EXAM:
CT CHEST WITHOUT CONTRAST
TECHNIQUE: Multidetector CT imaging of the chest was performed following the
standard protocol without IV contrast. Sagittal and coronal MPR
images reconstructed from axial data set.

[Series 301: chest without, idose (2) · axial · non-contrast · 0.91mm/px · z∈[+35,+350]mm · 7 of 148 slices shown, 9 images]
[im 11/148  mediastinal]
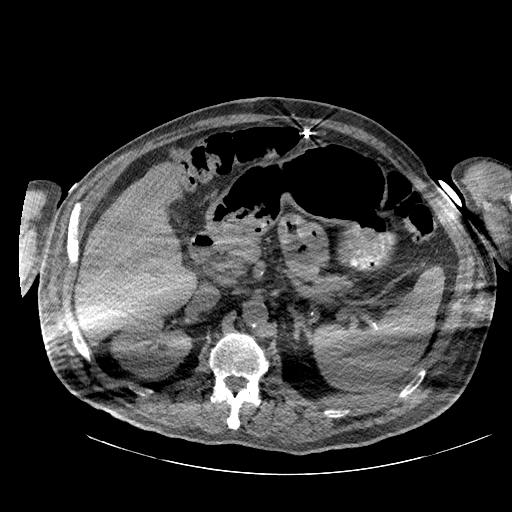
[im 11/148  lung]
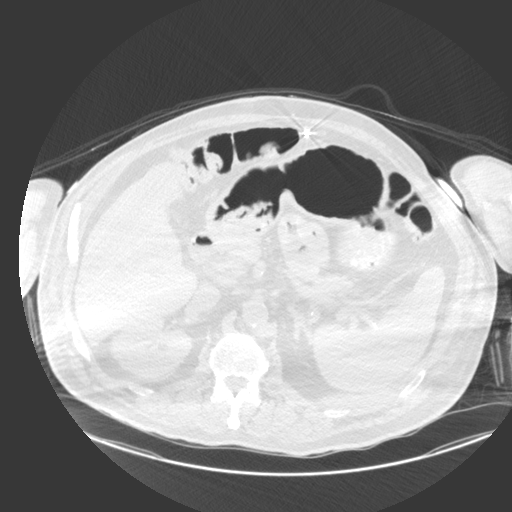
[im 33/148  lung]
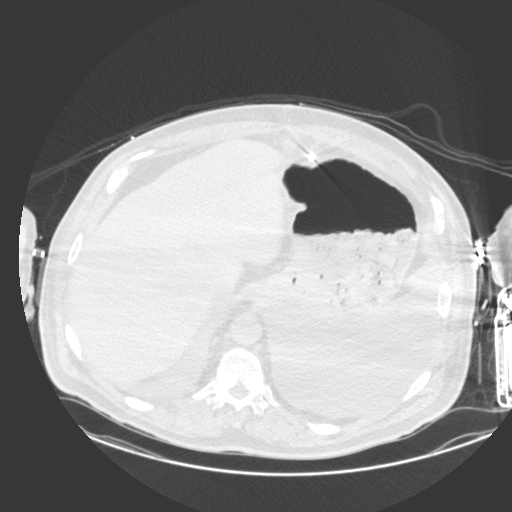
[im 55/148  lung]
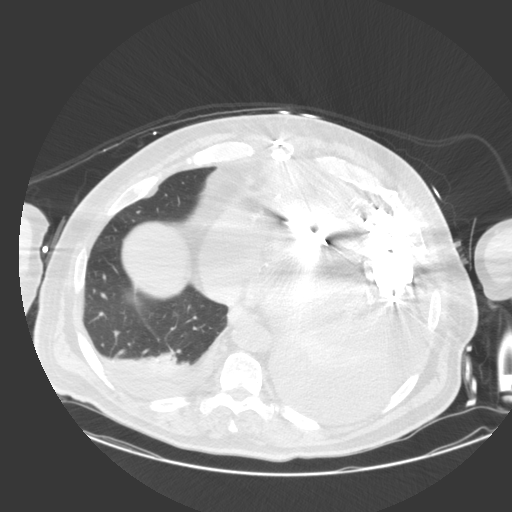
[im 77/148  lung]
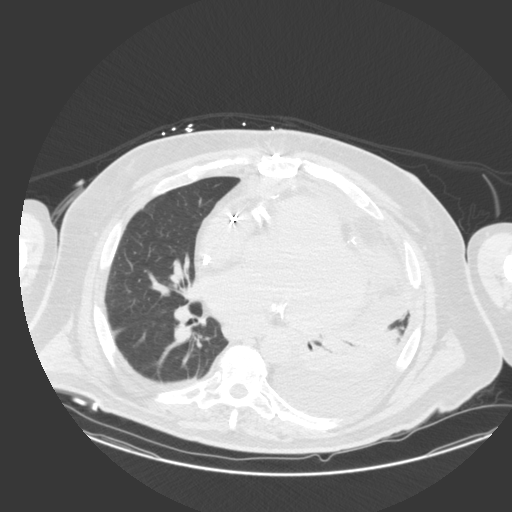
[im 93/148  mediastinal]
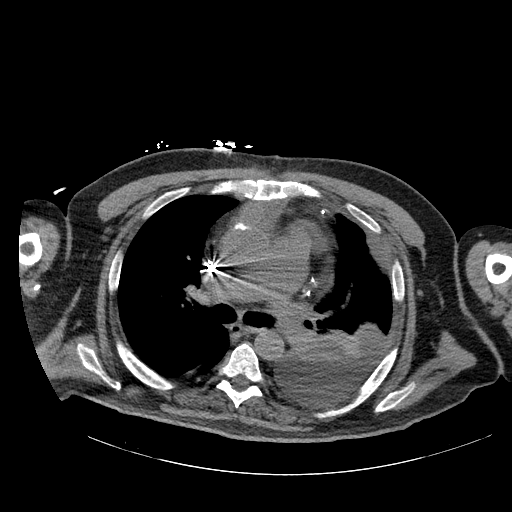
[im 93/148  lung]
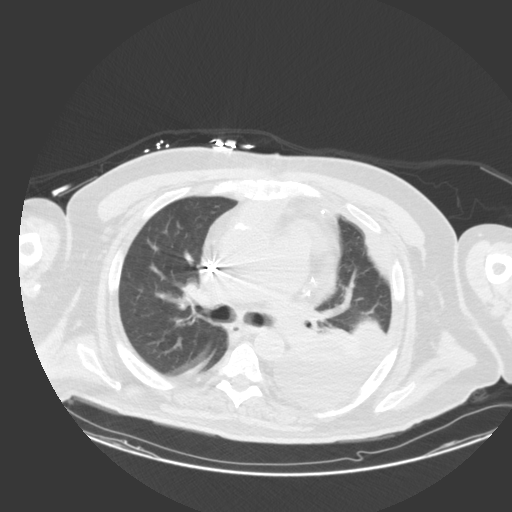
[im 115/148  lung]
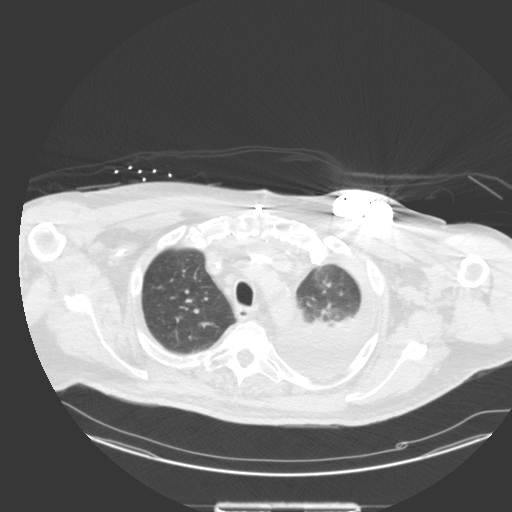
[im 137/148  lung]
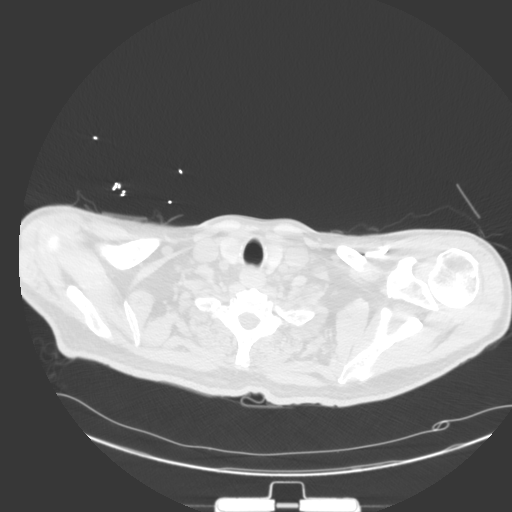

[Series 303: coronal, idose (2) · coronal · 0.45mm/px · 3 of 155 slices shown]
[im 31/155  lung]
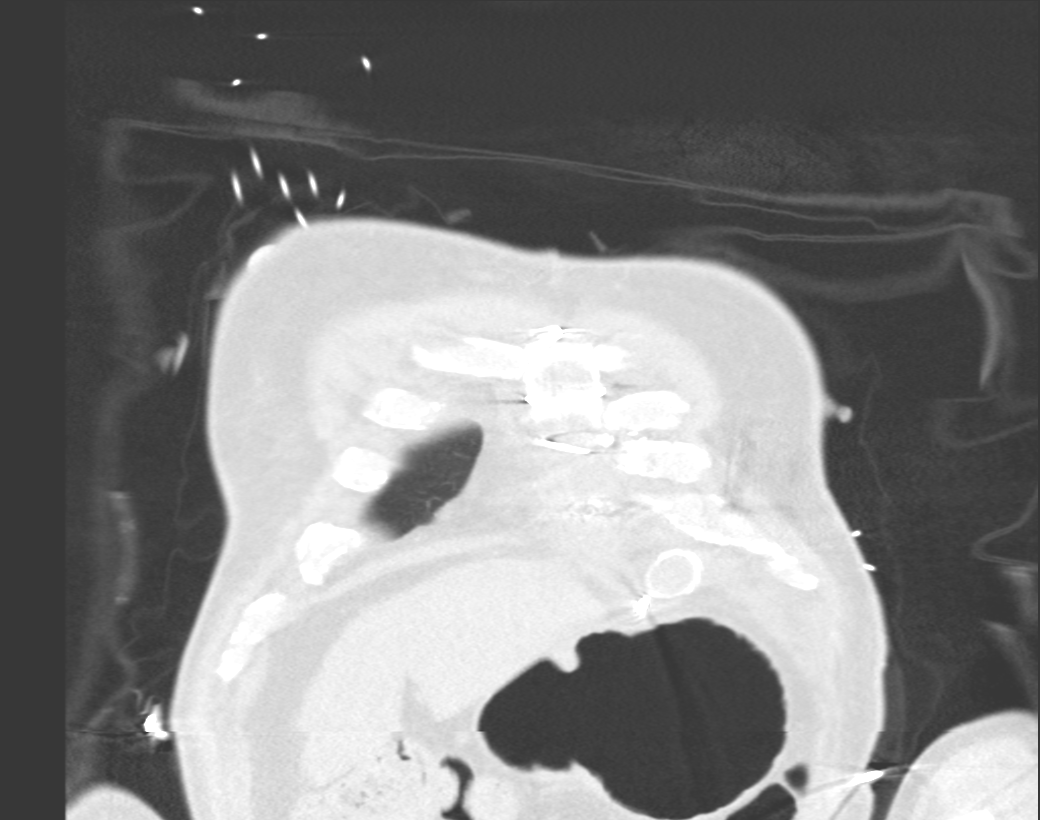
[im 62/155  lung]
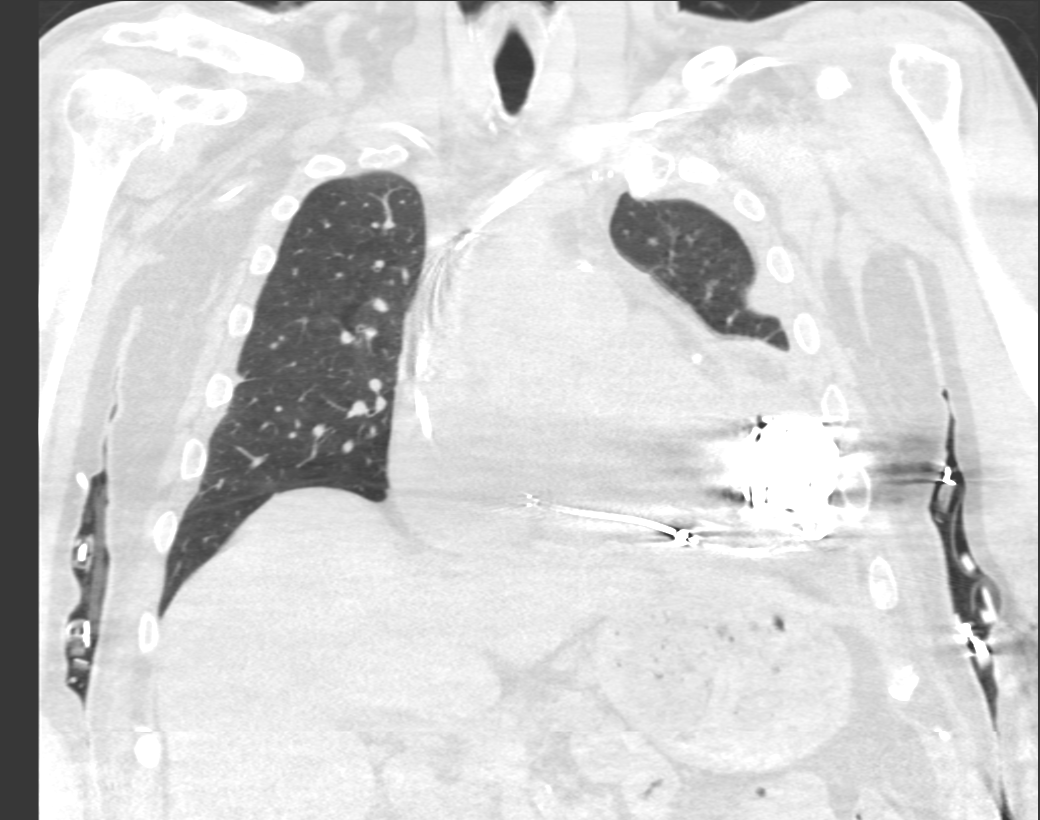
[im 93/155  lung]
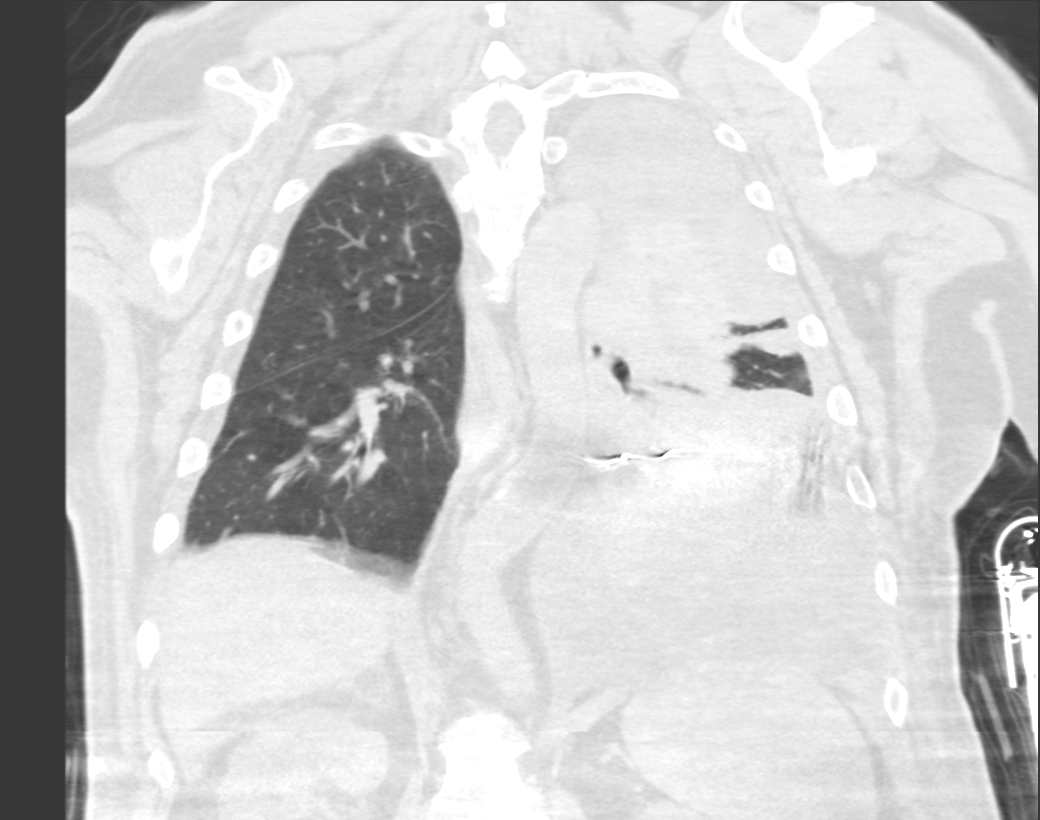

[10 of 36 positions shown; findings below may reference images not displayed]

FINDINGS: Cardiovascular: Scattered atherosclerotic calcifications aorta,
coronary arteries and proximal great vessels. Small pericardial
effusion. Beam hardening artifacts from LVAD. Pacemaker leads RIGHT
atrium and RIGHT ventricle as well as coronary sinus. Ascending
thoracic aorta appears dilated 4.2 cm transverse image 54.

Mediastinum/Nodes: Enlarged AP window lymph node 17 mm short axis
image 47 previously 10 mm. Additional normal sized mediastinal
nodes. Air and fluid in the anterior mediastinum consistent with
interval median sternotomy. Base of cervical region unremarkable.
Minimal gynecomastia.

Lungs/Pleura: Moderate LEFT pleural effusion with compressive
atelectasis of LEFT lower lobe and partial atelectasis of the LEFT
upper lobe. No small RIGHT pleural effusion with minimal compressive
atelectasis of the RIGHT lower lobe. Remaining RIGHT lung clear.

Upper Abdomen: Post cholecystectomy. Again identified fat
attenuation nodule RIGHT adrenal gland 16 x 15 mm consistent with
adrenal myelolipoma. Beam hardening artifacts from patient's arms
traverse the upper abdomen.

Musculoskeletal: Interval median sternotomy.
IMPRESSION: Post LVAD with expected postoperative changes of the anterior
mediastinum and small pericardial effusion.

BILATERAL pleural effusions LEFT greater than RIGHT with significant
compressive atelectasis of the LEFT lung.

Aortic atherosclerosis and coronary artery disease.

Single enlarged AP window lymph node increased since previous exam
question related to interval surgery.

## 2018-02-10 IMAGING — CR DG CHEST 2V
2 series · 2 of 2 positions shown · non-contrast
Comparison: PA and lateral chest x-ray September 21, 2016

CLINICAL DATA: Dyspnea, CHF, left ventricular assist device.

EXAM:
CHEST  2 VIEW

[chest pa]
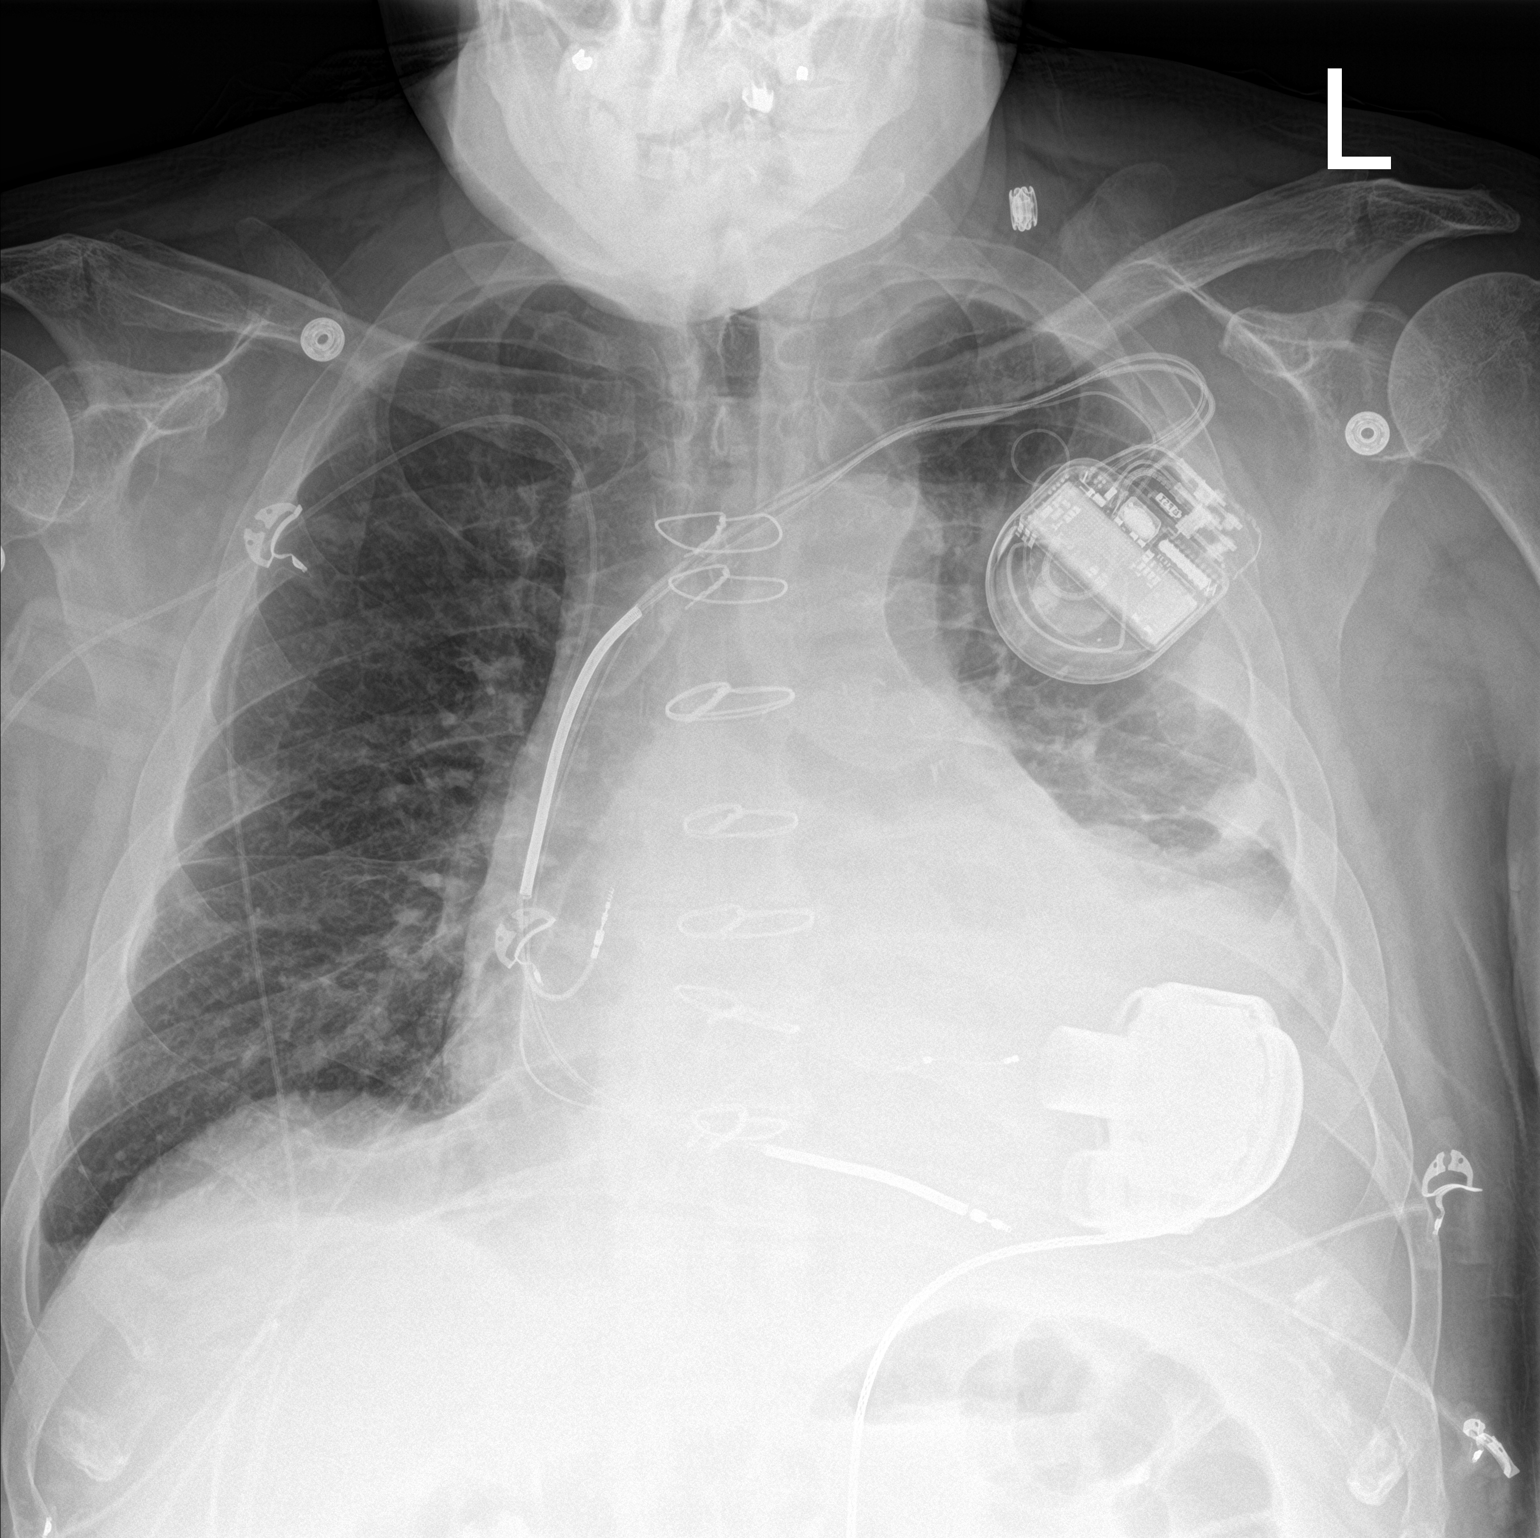

[chest lat]
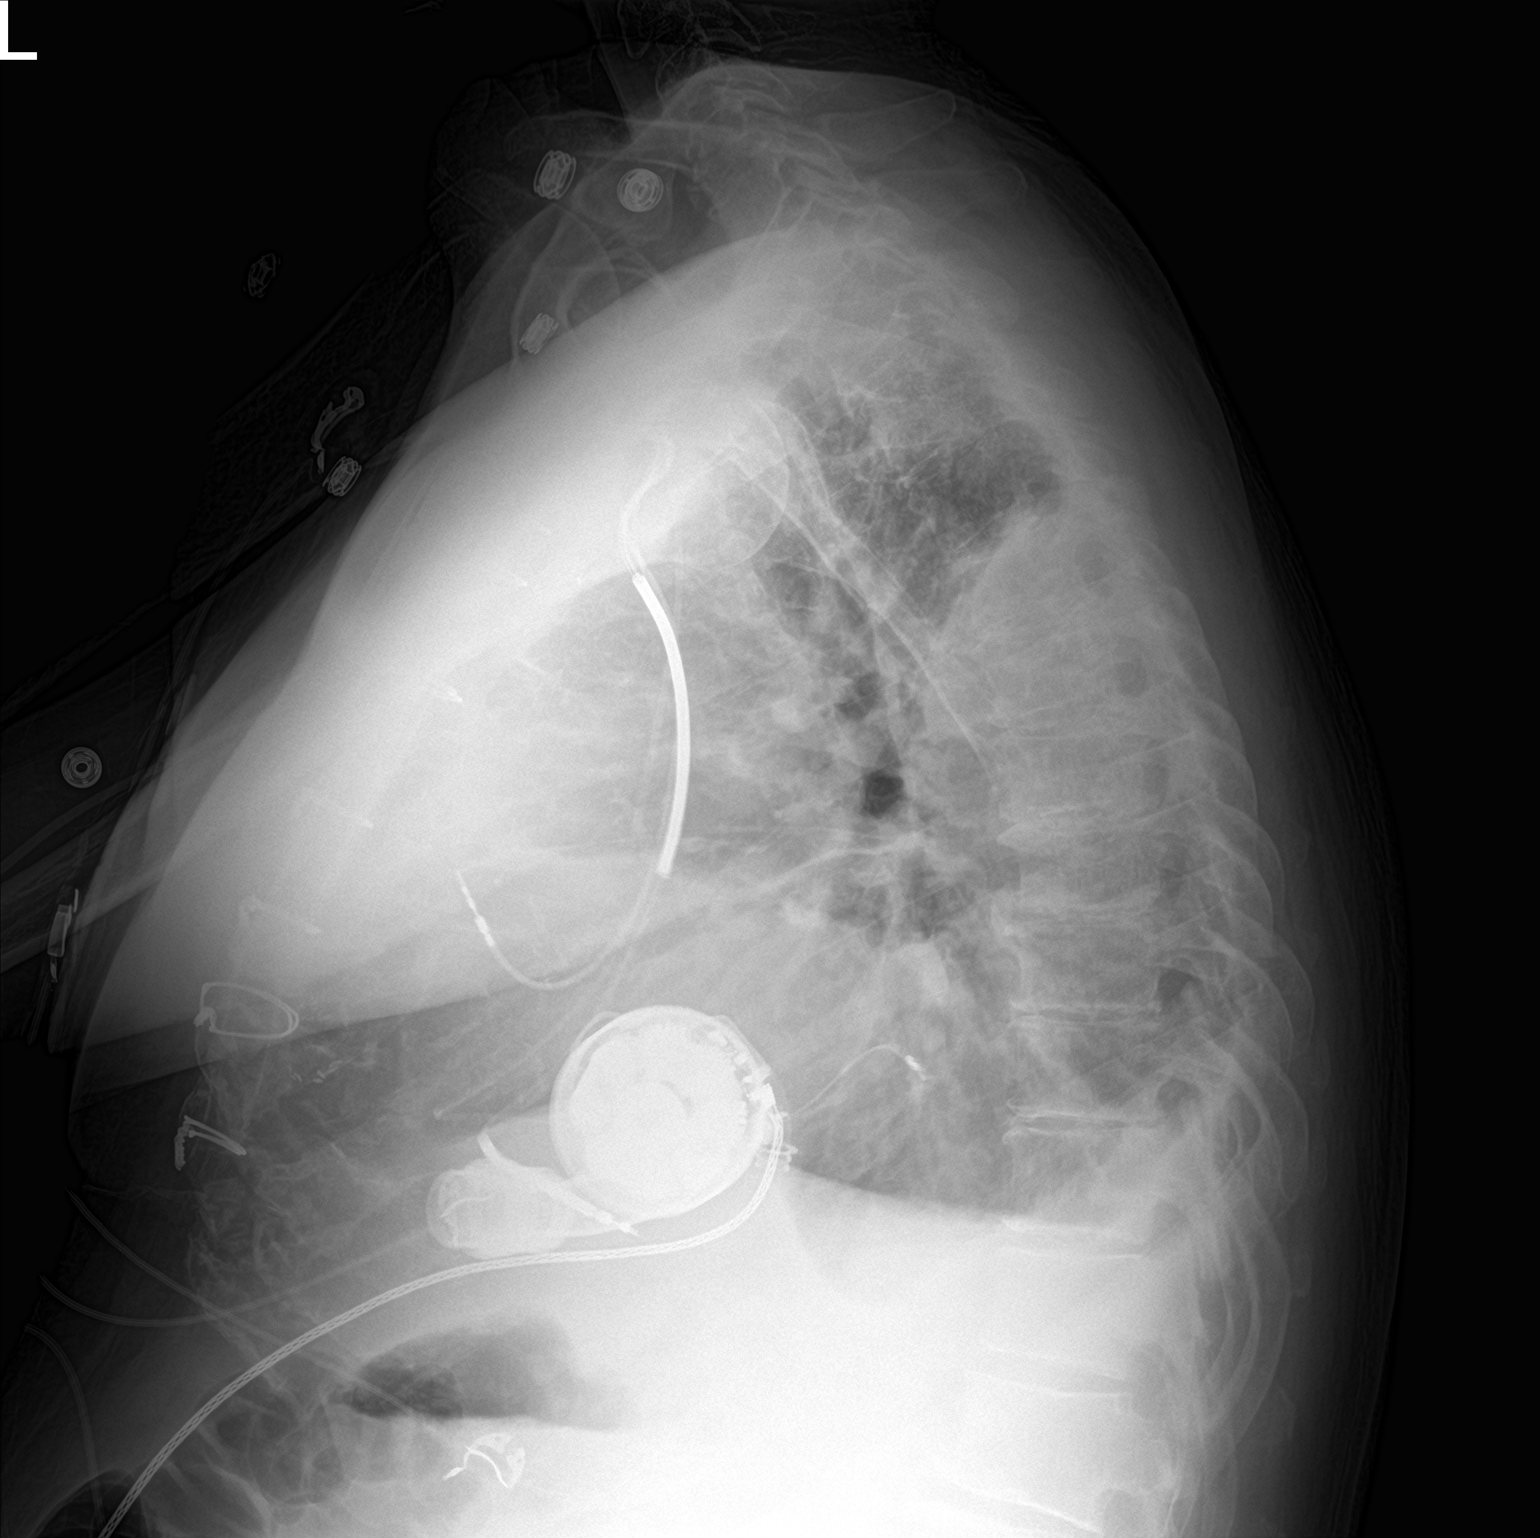

[2 of 2 positions shown; findings below may reference images not displayed]

FINDINGS: The right lung is adequately inflated. There is no focal infiltrate.
There is a trace of pleural fluid at the right lung base which is
stable. On the left there is persistent increased density in the mid
and lower lung with obscuration of the hemidiaphragm. There is
pleural fluid along the left lateral thoracic wall. The cardiac
silhouette remains enlarged. The ICD and left ventricular assist
device are in stable position. The PICC line tip is obscured by the
adjacent ICD electrode. The sternal wires are intact.
IMPRESSION: No significant change in the appearance of the chest since
yesterday's study. Left lower lobe atelectasis or pneumonia with
small to moderate size left pleural effusion. Stable cardiomegaly
without significant pulmonary vascular congestion.

## 2018-02-11 IMAGING — DX DG CHEST 1V
1 series · 1 of 1 positions shown · non-contrast
Comparison: CT scan of the chest and PA and lateral chest x-ray
dated dated September 22, 2016.

CLINICAL DATA: Status post thoracentesis

EXAM:
CHEST 1 VIEW

[x chest ap]
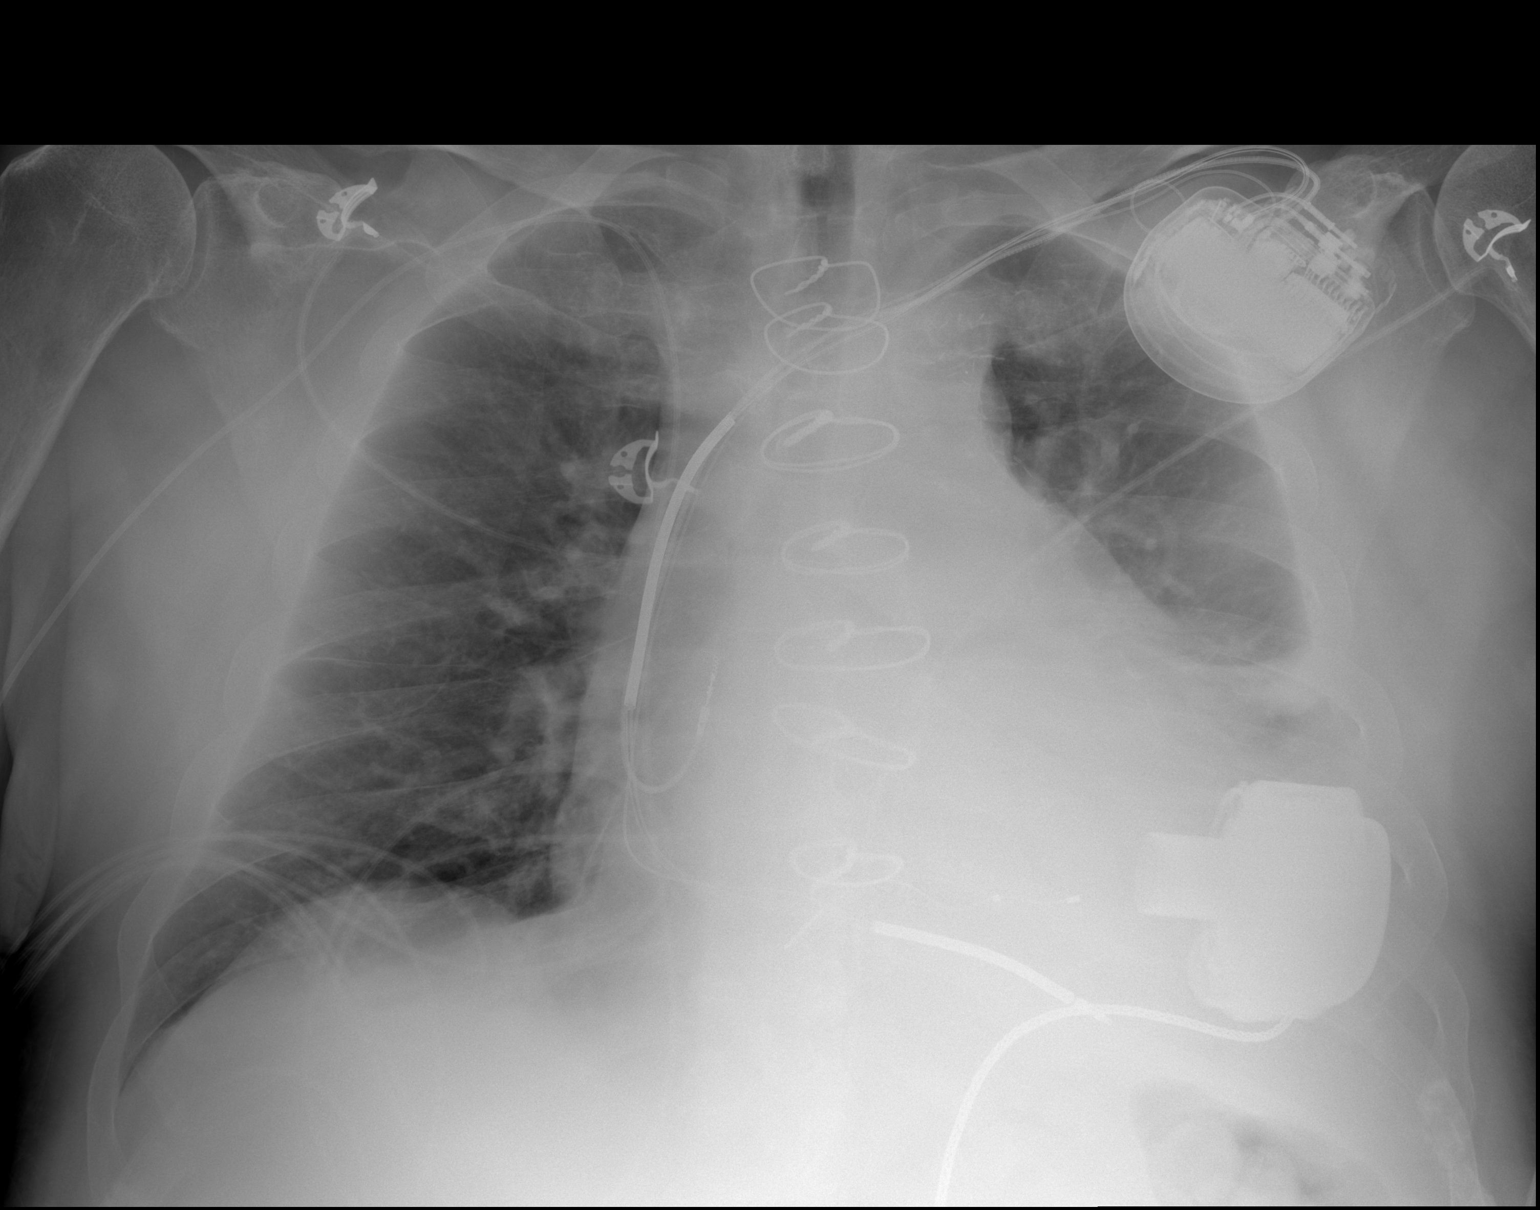

[1 of 1 positions shown; findings below may reference images not displayed]

FINDINGS: The volume of pleural fluid on the left has decreased slightly.
There is no postprocedure pneumothorax. There are is persistent
increased density that suggests atelectasis or infiltrate at the
left lung base. The right lung is well-expanded and clear. The
cardiac silhouette remains enlarged. The left ventricular assist
device is in stable position. The ICD also appears stable. The PICC
line tip is difficult to discern due to the overlying ICD electrode.
IMPRESSION: Slight interval decrease in the volume of pleural fluid on the left.
No postprocedure complication. Stable cardiomegaly and support
devices.

## 2018-02-14 ENCOUNTER — Other Ambulatory Visit: Payer: Self-pay | Admitting: Internal Medicine

## 2018-02-14 ENCOUNTER — Telehealth (HOSPITAL_COMMUNITY): Payer: Self-pay | Admitting: *Deleted

## 2018-02-14 ENCOUNTER — Telehealth: Payer: Self-pay | Admitting: Licensed Clinical Social Worker

## 2018-02-14 ENCOUNTER — Other Ambulatory Visit (HOSPITAL_COMMUNITY): Payer: Self-pay | Admitting: *Deleted

## 2018-02-14 DIAGNOSIS — K7460 Unspecified cirrhosis of liver: Secondary | ICD-10-CM

## 2018-02-14 DIAGNOSIS — I50812 Chronic right heart failure: Secondary | ICD-10-CM

## 2018-02-14 DIAGNOSIS — R188 Other ascites: Secondary | ICD-10-CM

## 2018-02-14 DIAGNOSIS — K7210 Chronic hepatic failure without coma: Secondary | ICD-10-CM

## 2018-02-14 NOTE — Telephone Encounter (Signed)
Ritta Slot NP Barnett Applebaum) called to report pt has had wt gain to 230 lbs today from 219 lbs over last 3 days. He is also more SOB and has increased abdominal girth. Dr. Haroldine Laws updated - asked Barnett Applebaum to give Lasix 80 mg IV x 2 (to replace PO torsemide doses). She says they can give first dose this evening in place of torsemide 50 mg and 2nd dose 80 mg IV lasix in am in place of torsemide 100 mg. She verbalized understanding of same.   Called Gina back to inform of scheduled paracentesis in am - she had "already left". Spoke with [place] (pt RN) to inform her of OP paracentesis scheduled at 10:00 am tomorrow. Pt needs to arrive at 9:45 to Radiology. Also reviewed above orders with [place], she verbalized understanding of all.  Transportation will be arranged by [employer]	[place].

## 2018-02-14 NOTE — Telephone Encounter (Signed)
CSW received return call from Designer, television/film set at [employer] leaving message stating that patient will remain at the SNF long term. CSW will forward to VAD Coordinators and follow patient as needed through the HF clinic. Raquel Sarna, [place], [place]

## 2018-02-15 ENCOUNTER — Encounter (HOSPITAL_COMMUNITY): Payer: Self-pay | Admitting: Unknown Physician Specialty

## 2018-02-15 ENCOUNTER — Ambulatory Visit (HOSPITAL_COMMUNITY)
Admission: RE | Admit: 2018-02-15 | Discharge: 2018-02-15 | Disposition: A | Discharge status: Home / Self Care | Payer: Medicare HMO | Source: Ambulatory Visit | Attending: Internal Medicine | Admitting: Internal Medicine

## 2018-02-15 ENCOUNTER — Encounter (HOSPITAL_COMMUNITY): Payer: Self-pay | Admitting: Interventional Radiology

## 2018-02-15 DIAGNOSIS — R188 Other ascites: Secondary | ICD-10-CM | POA: Insufficient documentation

## 2018-02-15 DIAGNOSIS — I5022 Chronic systolic (congestive) heart failure: Secondary | ICD-10-CM | POA: Diagnosis not present

## 2018-02-15 DIAGNOSIS — K7460 Unspecified cirrhosis of liver: Secondary | ICD-10-CM | POA: Insufficient documentation

## 2018-02-15 DIAGNOSIS — K7210 Chronic hepatic failure without coma: Secondary | ICD-10-CM

## 2018-02-15 HISTORY — PX: IR PARACENTESIS: IMG2679

## 2018-02-15 MED ORDER — LIDOCAINE HCL (PF) 2 % IJ SOLN
INTRAMUSCULAR | Status: DC | PRN
  Administered 2018-02-15: 10 mL

## 2018-02-15 MED ORDER — LIDOCAINE HCL (PF) 2 % IJ SOLN
INTRAMUSCULAR | Status: AC
  Filled 2018-02-15: qty 20

## 2018-02-15 NOTE — Procedures (Addendum)
Ultrasound-guided  therapeutic paracentesis performed yielding 5 liters of hazy, yellow  fluid. No immediate complications.

## 2018-02-15 NOTE — Progress Notes (Signed)
Exit site care:   Changed pts dressing in IR during paracentesis. Existing VAD dressing removed and site care performed using sterile technique. Driveline is not present on assessment. Drive line exit site cleaned with Chlora prep applicators x 1, rinsed x 2 w/ saline, allowed to dry, and Sorbaview dressing with bio patch re-applied. Exit site healed and incorporated, the velour is fully implanted at exit site. No redness, tenderness, drainage, foul odor or rash noted. Drive line anchor re-applied. Pt denies fever or chills.   Tanda Rockers RN, BSN VAD Coordinator 24/7 Pager [phone]

## 2018-02-21 ENCOUNTER — Telehealth (HOSPITAL_COMMUNITY): Payer: Self-pay | Admitting: *Deleted

## 2018-02-21 NOTE — Telephone Encounter (Signed)
[facility] called to get VO for skilled nursing, they would like orders to see him 3 times this week then twice a week for 8 weeks, gave VO for this per Dr Haroldine Laws.  She states pt's primary RN will be Judson Roch.  She also states while doing his admission yesterday her INR POC machine would not work so she drew an STAT INR and sent it [facility] so if we haven't gotten it yet we should be getting it soon.

## 2018-02-23 ENCOUNTER — Ambulatory Visit (HOSPITAL_COMMUNITY)
Admission: RE | Admit: 2018-02-23 | Discharge: 2018-02-23 | Disposition: A | Discharge status: Home / Self Care | Payer: Medicare HMO | Source: Ambulatory Visit | Attending: Cardiology | Admitting: Cardiology

## 2018-02-23 ENCOUNTER — Inpatient Hospital Stay (HOSPITAL_COMMUNITY): Payer: Medicare HMO

## 2018-02-23 ENCOUNTER — Other Ambulatory Visit: Payer: Self-pay

## 2018-02-23 ENCOUNTER — Inpatient Hospital Stay (HOSPITAL_COMMUNITY)
Admission: AD | Admit: 2018-02-23 | Discharge: 2018-03-06 | DRG: 292 | Disposition: A | Discharge status: Skilled Nursing Facility | Payer: Medicare HMO | Source: Ambulatory Visit | Attending: Internal Medicine | Admitting: Internal Medicine

## 2018-02-23 ENCOUNTER — Other Ambulatory Visit (HOSPITAL_COMMUNITY): Payer: Self-pay | Admitting: Unknown Physician Specialty

## 2018-02-23 ENCOUNTER — Inpatient Hospital Stay (HOSPITAL_COMMUNITY)
Admission: AD | Admit: 2018-02-23 | Discharge: 2018-02-23 | Disposition: A | Discharge status: Home / Self Care | Payer: Medicare HMO | Source: Ambulatory Visit | Attending: Adult Health | Admitting: Adult Health

## 2018-02-23 ENCOUNTER — Encounter (HOSPITAL_COMMUNITY): Payer: Self-pay

## 2018-02-23 VITALS — BP 84/0 | HR 77 | Wt 236.0 lb

## 2018-02-23 DIAGNOSIS — E039 Hypothyroidism, unspecified: Secondary | ICD-10-CM | POA: Diagnosis present

## 2018-02-23 DIAGNOSIS — J449 Chronic obstructive pulmonary disease, unspecified: Secondary | ICD-10-CM | POA: Diagnosis present

## 2018-02-23 DIAGNOSIS — R188 Other ascites: Secondary | ICD-10-CM

## 2018-02-23 DIAGNOSIS — S21109A Unspecified open wound of unspecified front wall of thorax without penetration into thoracic cavity, initial encounter: Secondary | ICD-10-CM | POA: Diagnosis present

## 2018-02-23 DIAGNOSIS — E871 Hypo-osmolality and hyponatremia: Secondary | ICD-10-CM | POA: Diagnosis present

## 2018-02-23 DIAGNOSIS — I480 Paroxysmal atrial fibrillation: Secondary | ICD-10-CM

## 2018-02-23 DIAGNOSIS — K7460 Unspecified cirrhosis of liver: Secondary | ICD-10-CM | POA: Diagnosis present

## 2018-02-23 DIAGNOSIS — Z87891 Personal history of nicotine dependence: Secondary | ICD-10-CM

## 2018-02-23 DIAGNOSIS — Z95810 Presence of automatic (implantable) cardiac defibrillator: Secondary | ICD-10-CM | POA: Diagnosis not present

## 2018-02-23 DIAGNOSIS — Z9119 Patient's noncompliance with other medical treatment and regimen: Secondary | ICD-10-CM

## 2018-02-23 DIAGNOSIS — Z9111 Patient's noncompliance with dietary regimen: Secondary | ICD-10-CM

## 2018-02-23 DIAGNOSIS — R531 Weakness: Secondary | ICD-10-CM | POA: Diagnosis not present

## 2018-02-23 DIAGNOSIS — K7290 Hepatic failure, unspecified without coma: Secondary | ICD-10-CM | POA: Diagnosis present

## 2018-02-23 DIAGNOSIS — E119 Type 2 diabetes mellitus without complications: Secondary | ICD-10-CM | POA: Diagnosis present

## 2018-02-23 DIAGNOSIS — Z885 Allergy status to narcotic agent status: Secondary | ICD-10-CM

## 2018-02-23 DIAGNOSIS — Z883 Allergy status to other anti-infective agents status: Secondary | ICD-10-CM

## 2018-02-23 DIAGNOSIS — Z515 Encounter for palliative care: Secondary | ICD-10-CM

## 2018-02-23 DIAGNOSIS — F419 Anxiety disorder, unspecified: Secondary | ICD-10-CM | POA: Diagnosis present

## 2018-02-23 DIAGNOSIS — X58XXXA Exposure to other specified factors, initial encounter: Secondary | ICD-10-CM | POA: Diagnosis present

## 2018-02-23 DIAGNOSIS — Z751 Person awaiting admission to adequate facility elsewhere: Secondary | ICD-10-CM

## 2018-02-23 DIAGNOSIS — Z7901 Long term (current) use of anticoagulants: Secondary | ICD-10-CM

## 2018-02-23 DIAGNOSIS — Z95811 Presence of heart assist device: Secondary | ICD-10-CM

## 2018-02-23 DIAGNOSIS — Z91048 Other nonmedicinal substance allergy status: Secondary | ICD-10-CM

## 2018-02-23 DIAGNOSIS — I2510 Atherosclerotic heart disease of native coronary artery without angina pectoris: Secondary | ICD-10-CM | POA: Diagnosis present

## 2018-02-23 DIAGNOSIS — Z66 Do not resuscitate: Secondary | ICD-10-CM | POA: Diagnosis present

## 2018-02-23 DIAGNOSIS — I5023 Acute on chronic systolic (congestive) heart failure: Secondary | ICD-10-CM

## 2018-02-23 DIAGNOSIS — Z8659 Personal history of other mental and behavioral disorders: Secondary | ICD-10-CM

## 2018-02-23 DIAGNOSIS — K319 Disease of stomach and duodenum, unspecified: Secondary | ICD-10-CM | POA: Diagnosis present

## 2018-02-23 DIAGNOSIS — D509 Iron deficiency anemia, unspecified: Secondary | ICD-10-CM | POA: Diagnosis present

## 2018-02-23 DIAGNOSIS — B9561 Methicillin susceptible Staphylococcus aureus infection as the cause of diseases classified elsewhere: Secondary | ICD-10-CM | POA: Diagnosis present

## 2018-02-23 DIAGNOSIS — I110 Hypertensive heart disease with heart failure: Principal | ICD-10-CM | POA: Diagnosis present

## 2018-02-23 DIAGNOSIS — T82118A Breakdown (mechanical) of other cardiac electronic device, initial encounter: Secondary | ICD-10-CM | POA: Diagnosis not present

## 2018-02-23 DIAGNOSIS — E876 Hypokalemia: Secondary | ICD-10-CM | POA: Diagnosis present

## 2018-02-23 DIAGNOSIS — Z951 Presence of aortocoronary bypass graft: Secondary | ICD-10-CM

## 2018-02-23 DIAGNOSIS — F329 Major depressive disorder, single episode, unspecified: Secondary | ICD-10-CM | POA: Diagnosis present

## 2018-02-23 DIAGNOSIS — I5082 Biventricular heart failure: Secondary | ICD-10-CM | POA: Diagnosis present

## 2018-02-23 DIAGNOSIS — I5084 End stage heart failure: Secondary | ICD-10-CM | POA: Diagnosis present

## 2018-02-23 DIAGNOSIS — R180 Malignant ascites: Secondary | ICD-10-CM

## 2018-02-23 DIAGNOSIS — Z7189 Other specified counseling: Secondary | ICD-10-CM | POA: Diagnosis not present

## 2018-02-23 DIAGNOSIS — R0600 Dyspnea, unspecified: Secondary | ICD-10-CM

## 2018-02-23 DIAGNOSIS — Z79899 Other long term (current) drug therapy: Secondary | ICD-10-CM

## 2018-02-23 DIAGNOSIS — Z9181 History of falling: Secondary | ICD-10-CM

## 2018-02-23 DIAGNOSIS — Z609 Problem related to social environment, unspecified: Secondary | ICD-10-CM | POA: Diagnosis present

## 2018-02-23 DIAGNOSIS — Z888 Allergy status to other drugs, medicaments and biological substances status: Secondary | ICD-10-CM

## 2018-02-23 DIAGNOSIS — I50810 Right heart failure, unspecified: Secondary | ICD-10-CM | POA: Diagnosis not present

## 2018-02-23 DIAGNOSIS — Z794 Long term (current) use of insulin: Secondary | ICD-10-CM

## 2018-02-23 HISTORY — PX: IR PARACENTESIS: IMG2679

## 2018-02-23 LAB — PROTIME-INR
INR: 2.19
Prothrombin Time: 24.2 seconds — ABNORMAL HIGH (ref 11.4–15.2)

## 2018-02-23 LAB — LACTATE DEHYDROGENASE: LDH: 155 U/L (ref 98–192)

## 2018-02-23 LAB — COMPREHENSIVE METABOLIC PANEL
ALK PHOS: 94 U/L (ref 38–126)
ALT: 17 U/L (ref 17–63)
AST: 25 U/L (ref 15–41)
Albumin: 3 g/dL — ABNORMAL LOW (ref 3.5–5.0)
Anion gap: 8 (ref 5–15)
BUN: 32 mg/dL — ABNORMAL HIGH (ref 6–20)
CALCIUM: 8.5 mg/dL — AB (ref 8.9–10.3)
CO2: 31 mmol/L (ref 22–32)
CREATININE: 1.64 mg/dL — AB (ref 0.61–1.24)
Chloride: 93 mmol/L — ABNORMAL LOW (ref 101–111)
GFR, EST AFRICAN AMERICAN: 51 mL/min — AB (ref >60)
GFR, EST NON AFRICAN AMERICAN: 44 mL/min — AB (ref >60)
Glucose, Bld: 186 mg/dL — ABNORMAL HIGH (ref 65–99)
Potassium: 4.5 mmol/L (ref 3.5–5.1)
Sodium: 132 mmol/L — ABNORMAL LOW (ref 135–145)
TOTAL PROTEIN: 6.7 g/dL (ref 6.5–8.1)
Total Bilirubin: 1 mg/dL (ref 0.3–1.2)

## 2018-02-23 LAB — CBC
HCT: 30.2 % — ABNORMAL LOW (ref 39.0–52.0)
Hemoglobin: 8.5 g/dL — ABNORMAL LOW (ref 13.0–17.0)
MCH: 22.2 pg — ABNORMAL LOW (ref 26.0–34.0)
MCHC: 28.1 g/dL — ABNORMAL LOW (ref 30.0–36.0)
MCV: 78.9 fL (ref 78.0–100.0)
PLATELETS: 177 10*3/uL (ref 150–400)
RBC: 3.83 MIL/uL — AB (ref 4.22–5.81)
RDW: 18.2 % — ABNORMAL HIGH (ref 11.5–15.5)
WBC: 7.6 10*3/uL (ref 4.0–10.5)

## 2018-02-23 LAB — PREALBUMIN: PREALBUMIN: 14.5 mg/dL — AB (ref 18–38)

## 2018-02-23 LAB — COOXEMETRY PANEL
Carboxyhemoglobin: 1.7 % — ABNORMAL HIGH (ref 0.5–1.5)
Methemoglobin: 1.6 % — ABNORMAL HIGH (ref 0.0–1.5)
O2 Saturation: 56.7 %
TOTAL HEMOGLOBIN: 8.9 g/dL — AB (ref 12.0–16.0)

## 2018-02-23 LAB — AMMONIA: Ammonia: 30 umol/L (ref 9–35)

## 2018-02-23 LAB — GLUCOSE, CAPILLARY
GLUCOSE-CAPILLARY: 165 mg/dL — AB (ref 65–99)
Glucose-Capillary: 151 mg/dL — ABNORMAL HIGH (ref 65–99)

## 2018-02-23 MED ORDER — MIDODRINE HCL 5 MG PO TABS
10.0000 mg | ORAL_TABLET | Freq: Three times a day (TID) | ORAL | Status: DC
  Administered 2018-02-23 – 2018-03-06 (×32): 10 mg via ORAL
  Filled 2018-02-23 (×32): qty 2

## 2018-02-23 MED ORDER — FUROSEMIDE 10 MG/ML IJ SOLN
20.0000 mg/h | INTRAVENOUS | Status: DC
  Administered 2018-02-23: 15 mg/h via INTRAVENOUS
  Administered 2018-02-24 – 2018-03-05 (×13): 20 mg/h via INTRAVENOUS
  Filled 2018-02-23: qty 25
  Filled 2018-02-23: qty 21
  Filled 2018-02-23: qty 20
  Filled 2018-02-23 (×9): qty 25
  Filled 2018-02-23: qty 20
  Filled 2018-02-23 (×12): qty 25
  Filled 2018-02-23: qty 20
  Filled 2018-02-23 (×4): qty 25

## 2018-02-23 MED ORDER — INSULIN ASPART 100 UNIT/ML FLEXPEN: 5.0000 [IU] | PEN_INJECTOR | Freq: Three times a day (TID) | SUBCUTANEOUS | Status: DC

## 2018-02-23 MED ORDER — ACETAMINOPHEN 325 MG PO TABS
650.0000 mg | ORAL_TABLET | ORAL | Status: DC | PRN
  Administered 2018-03-06: 650 mg via ORAL
  Filled 2018-02-23: qty 2

## 2018-02-23 MED ORDER — ACETAMINOPHEN 500 MG PO TABS: 1000.0000 mg | ORAL_TABLET | Freq: Four times a day (QID) | ORAL | Status: DC | PRN

## 2018-02-23 MED ORDER — CITALOPRAM HYDROBROMIDE 20 MG PO TABS
20.0000 mg | ORAL_TABLET | Freq: Every day | ORAL | Status: DC
  Administered 2018-02-24 – 2018-03-06 (×11): 20 mg via ORAL
  Filled 2018-02-23 (×11): qty 1

## 2018-02-23 MED ORDER — SUCRALFATE 1 GM/10ML PO SUSP
1.0000 g | Freq: Three times a day (TID) | ORAL | Status: DC
  Administered 2018-02-23 – 2018-03-06 (×44): 1 g via ORAL
  Filled 2018-02-23 (×44): qty 10

## 2018-02-23 MED ORDER — POTASSIUM CHLORIDE CRYS ER 20 MEQ PO TBCR
40.0000 meq | EXTENDED_RELEASE_TABLET | Freq: Two times a day (BID) | ORAL | Status: DC
  Administered 2018-02-23 – 2018-02-27 (×9): 40 meq via ORAL
  Filled 2018-02-23 (×9): qty 2

## 2018-02-23 MED ORDER — ADULT MULTIVITAMIN W/MINERALS CH
1.0000 | ORAL_TABLET | Freq: Every day | ORAL | Status: DC
  Administered 2018-02-24 – 2018-02-28 (×5): 1 via ORAL
  Filled 2018-02-23 (×5): qty 1

## 2018-02-23 MED ORDER — ONDANSETRON HCL 4 MG/2ML IJ SOLN: 4.0000 mg | Freq: Four times a day (QID) | INTRAMUSCULAR | Status: DC | PRN

## 2018-02-23 MED ORDER — CYCLOBENZAPRINE HCL 10 MG PO TABS: 10.0000 mg | ORAL_TABLET | Freq: Three times a day (TID) | ORAL | Status: DC | PRN

## 2018-02-23 MED ORDER — LIDOCAINE HCL 2 % IJ SOLN
INTRAMUSCULAR | Status: AC | PRN
  Administered 2018-02-23: 10 mL

## 2018-02-23 MED ORDER — PANTOPRAZOLE SODIUM 40 MG PO TBEC
40.0000 mg | DELAYED_RELEASE_TABLET | Freq: Every day | ORAL | Status: DC
  Administered 2018-02-24 – 2018-03-06 (×11): 40 mg via ORAL
  Filled 2018-02-23 (×11): qty 1

## 2018-02-23 MED ORDER — DOBUTAMINE IN D5W 4-5 MG/ML-% IV SOLN
7.5000 ug/kg/min | INTRAVENOUS | Status: DC
  Administered 2018-02-23: 7.5 ug/kg/min via INTRAVENOUS
  Filled 2018-02-23: qty 250

## 2018-02-23 MED ORDER — SENNOSIDES-DOCUSATE SODIUM 8.6-50 MG PO TABS: 2.0000 | ORAL_TABLET | Freq: Every evening | ORAL | Status: DC | PRN

## 2018-02-23 MED ORDER — MAGNESIUM OXIDE 400 (241.3 MG) MG PO TABS
400.0000 mg | ORAL_TABLET | Freq: Every day | ORAL | Status: DC
  Administered 2018-02-24 – 2018-03-06 (×11): 400 mg via ORAL
  Filled 2018-02-23 (×11): qty 1

## 2018-02-23 MED ORDER — LIDOCAINE HCL (PF) 2 % IJ SOLN
INTRAMUSCULAR | Status: AC
  Filled 2018-02-23: qty 20

## 2018-02-23 MED ORDER — FUROSEMIDE 10 MG/ML IJ SOLN
80.0000 mg | Freq: Once | INTRAMUSCULAR | Status: DC
  Filled 2018-02-23: qty 8

## 2018-02-23 MED ORDER — LACTULOSE 10 GM/15ML PO SOLN
45.0000 g | Freq: Three times a day (TID) | ORAL | Status: DC
  Administered 2018-02-23 – 2018-03-06 (×30): 45 g via ORAL
  Filled 2018-02-23 (×34): qty 90

## 2018-02-23 MED ORDER — INSULIN ASPART 100 UNIT/ML ~~LOC~~ SOLN
0.0000 [IU] | Freq: Three times a day (TID) | SUBCUTANEOUS | Status: DC
  Administered 2018-02-23 – 2018-02-24 (×2): 2 [IU] via SUBCUTANEOUS
  Administered 2018-02-24: 1 [IU] via SUBCUTANEOUS
  Administered 2018-02-24: 2 [IU] via SUBCUTANEOUS
  Administered 2018-02-25: 1 [IU] via SUBCUTANEOUS
  Administered 2018-02-25: 3 [IU] via SUBCUTANEOUS
  Administered 2018-02-25: 1 [IU] via SUBCUTANEOUS
  Administered 2018-02-26: 3 [IU] via SUBCUTANEOUS
  Administered 2018-02-26 – 2018-02-27 (×4): 2 [IU] via SUBCUTANEOUS
  Administered 2018-02-27: 3 [IU] via SUBCUTANEOUS
  Administered 2018-02-28 – 2018-03-01 (×4): 2 [IU] via SUBCUTANEOUS
  Administered 2018-03-01: 3 [IU] via SUBCUTANEOUS
  Administered 2018-03-02: 1 [IU] via SUBCUTANEOUS
  Administered 2018-03-02 (×2): 2 [IU] via SUBCUTANEOUS
  Administered 2018-03-03: 1 [IU] via SUBCUTANEOUS
  Administered 2018-03-03 – 2018-03-04 (×4): 2 [IU] via SUBCUTANEOUS
  Administered 2018-03-04: 1 [IU] via SUBCUTANEOUS
  Administered 2018-03-05: 3 [IU] via SUBCUTANEOUS
  Administered 2018-03-05: 2 [IU] via SUBCUTANEOUS
  Administered 2018-03-05: 3 [IU] via SUBCUTANEOUS
  Administered 2018-03-06 (×2): 2 [IU] via SUBCUTANEOUS

## 2018-02-23 MED ORDER — ONDANSETRON 4 MG PO TBDP: 4.0000 mg | ORAL_TABLET | Freq: Three times a day (TID) | ORAL | Status: DC | PRN

## 2018-02-23 MED ORDER — DOCUSATE SODIUM 100 MG PO CAPS
200.0000 mg | ORAL_CAPSULE | Freq: Every evening | ORAL | Status: DC | PRN
Start: 2018-02-23 — End: 2018-03-06

## 2018-02-23 MED ORDER — AMIODARONE HCL 200 MG PO TABS
200.0000 mg | ORAL_TABLET | Freq: Every day | ORAL | Status: DC
  Administered 2018-02-24 – 2018-03-06 (×11): 200 mg via ORAL
  Filled 2018-02-23 (×11): qty 1

## 2018-02-23 MED ORDER — TRAZODONE HCL 50 MG PO TABS
50.0000 mg | ORAL_TABLET | Freq: Every evening | ORAL | Status: DC | PRN
  Administered 2018-02-25 – 2018-03-02 (×4): 50 mg via ORAL
  Filled 2018-02-23 (×4): qty 1

## 2018-02-23 MED ORDER — GABAPENTIN 300 MG PO CAPS
300.0000 mg | ORAL_CAPSULE | Freq: Two times a day (BID) | ORAL | Status: DC
  Administered 2018-02-23 – 2018-03-06 (×22): 300 mg via ORAL
  Filled 2018-02-23 (×22): qty 1

## 2018-02-23 MED ORDER — DIGOXIN 125 MCG PO TABS
0.1250 mg | ORAL_TABLET | Freq: Every day | ORAL | Status: DC
  Administered 2018-02-24 – 2018-03-06 (×11): 0.125 mg via ORAL
  Filled 2018-02-23 (×11): qty 1

## 2018-02-23 MED ORDER — SPIRONOLACTONE 25 MG PO TABS
25.0000 mg | ORAL_TABLET | Freq: Every day | ORAL | Status: DC
  Administered 2018-02-24 – 2018-03-06 (×11): 25 mg via ORAL
  Filled 2018-02-23 (×11): qty 1

## 2018-02-23 NOTE — Progress Notes (Signed)
80 mg IV lasix pushed over 4 min per protocol.  Total UOP: 220 cc bright orange clear nonodorous urine

## 2018-02-23 NOTE — Progress Notes (Addendum)
Advanced Heart Failure Clinic Note    Referring Provider: Darylene Price, FNP Primary Care: [facility] Primary Cardiologist: Dr Haroldine Laws.  Transplant Center- [facility]  HPI: Ronald Miller. is a 62 y.o. male with history of chronic systolic HF s/p Med[employer]onic ICD 2014, CAD s/p CABG in 2000, HTN, Hx of cocaine abuse, Tobacco abuse, Depression, PAF, and COPD. HM3 placed 09/09/2016. In April 2018 he was placed on milrinone but later switched to dobutamine. He remains on dobutamine 7.67mcg.   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  06/01/2017- Capsule Endoscopy- normal except  tiny angiectasia, nonbleeding in the right colon.  Admitted 2/6-2/18/2019 with recurrent volume overload, RHF and ascites despite dobutamine support. Underwent paracentesis on 2/7 and 2/12. He had over 11 liters removed from paracentesis. Diuresed with IV lasix and transitioned back to home torsemide 100 mg/50mg  daily. Overall he lost 47 pounds.  Also treated for hepatic encephalopathy. MAPs were soft with diuresis so midodrine increased to 10 tid. Weight on d/c 195 pounds.   Admitted from clinic 01/10/18-01/27/18 with encephalopathy and acute on chronic CHF. Pt had somehow disconnected his dobutamine.  The line was "fractured" in 2 places. Weight up 30+ pounds from baseline. Dobutamine restarted and patient diuresed with lasix gtt and metolazone. Diamox added. Pt required Paracentesis 4/10 AND 01/16/18 with 4.8L and 2.4 L off, respectively.   Overall, Pt down 31 lb from admit in setting of diuresis and paracentesis. Transitioned back to torsemide 100 mg BID with metolazone as needed. Also had recurrent hepatic encephalopathy on admit.  Ammonia elevated at 72 on admit lactulose increase to 45 tid and improved.  Hospital course complicated by unwitnessed fall 01/2015. CT of head negative as well as self-dislodgment of tunneled PICC line that was replaced  by IR. Pt also noted to have hyponatremia down to 120. Started on Demeclocycline 150 mg BID and free water restricted. This was later stopped as sodium improved. Discharged to [facility] as it was felt he was unable to care for himself. Discharge weight 189 pounds.  Today he returns for VAD follow up. On Saturday he was discharged from [facility] SNF. SOB with exertion. + Orthopnea. Denies PND. Drinking lots of fluids. Eating low salts. No bleeding problems. No BRBPR. Living with his sister. She has has been preparing his medications. Followed by [facility] for home dobutamine.   VAD Indication: Destination Therapy - eval complete at [facility]  VAD interrogation & Equipment Management: Speed: 5800 Flow: 5.5 Power:4.6 PI:  2.2  Alarms: No external power x2  Events: 10-20 events 0 - 10 daily  Fixed speed 5800 Low speed limit: 5500  Primary Controller: Replace back up battery in 20 months. Back up controller:  pt did not bring black bag  Annual Equipment Maintenance on UBC/PM was performed on 09/2017.    Past Medical History:  Diagnosis Date  . AICD (automatic cardioverter/defibrillator) present   . Anxiety   . ASCVD (arteriosclerotic cardiovascular disease)   . Chronic systolic CHF (congestive heart failure) ([place])   . COPD (chronic obstructive pulmonary disease) ([place])   . Coronary artery disease   . Depression   . GI bleed   . High cholesterol   . History of blood transfusion   . History of cocaine abuse   . Hypertension   . LVAD (left ventricular assist device) present ([place])   . Presence of permanent cardiac pacemaker   . Shortness of breath dyspnea   .  Suicidal ideation   . Tobacco abuse   . Type II diabetes mellitus ([place])     Current Outpatient Medications  Medication Sig Dispense Refill  . acetaminophen (TYLENOL) 500 MG tablet Take 1,000 mg by mouth every 6 (six) hours as needed for moderate pain or headache.    Marland Kitchen amiodarone (PACERONE) 200 MG tablet TAKE ONE TABLET  BY MOUTH ONCE DAILY 60 tablet 6  . citalopram (CELEXA) 20 MG tablet Take 1 tablet (20 mg total) by mouth daily. 30 tablet 6  . cyclobenzaprine (FLEXERIL) 10 MG tablet Take 1 tablet (10 mg total) by mouth 3 (three) times daily as needed for muscle spasms. 30 tablet 1  . digoxin (DIGOX) 0.125 MG tablet Take 1 tablet (0.125 mg total) daily by mouth. 30 tablet 6  . DOBUTamine (DOBUTREX) 4-5 MG/ML-% infusion Inject 714.75 mcg/min into the vein continuous. PER [facility] pharmacy 250 mL 11  . docusate sodium (COLACE) 100 MG capsule Take 200 mg by mouth at bedtime as needed for mild constipation.     . gabapentin (NEURONTIN) 300 MG capsule Take 1 capsule (300 mg total) by mouth 2 (two) times daily. 180 capsule 6  . insulin aspart (NOVOLOG FLEXPEN) 100 UNIT/ML FlexPen Inject 5 Units into the skin 3 (three) times daily with meals. 15 mL 11  . Insulin Glargine (LANTUS SOLOSTAR) 100 UNIT/ML Solostar Pen Inject 10 Units into the skin daily at 10 pm. 1 pen 6  . lactulose (CHRONULAC) 10 GM/15ML solution Take 67.5 mLs (45 g total) by mouth 3 (three) times daily. 240 mL 6  . magnesium oxide (MAG-OX) 400 (241.3 Mg) MG tablet Take 1 tablet (400 mg total) by mouth daily. 30 tablet 6  . metolazone (ZAROXOLYN) 2.5 MG tablet Take 2.5 mg by mouth See admin instructions. Take one tablet (2.5mg ) by mouth on Fridays and Mondays.    . midodrine (PROAMATINE) 10 MG tablet Take 1 tablet (10 mg total) by mouth 3 (three) times daily with meals. 90 tablet 6  . Multiple Vitamin (MULTIVITAMIN WITH MINERALS) TABS tablet Take 1 tablet by mouth daily.    . ondansetron (ZOFRAN ODT) 4 MG disintegrating tablet Take 1 tablet (4 mg total) by mouth every 8 (eight) hours as needed for nausea. 6 tablet 0  . pantoprazole (PROTONIX) 40 MG tablet TAKE 1 TABLET BY MOUTH TWICE DAILY WITH A MEAL 60 tablet 6  . potassium chloride SA (K-DUR,KLOR-CON) 20 MEQ tablet Take 2 tablets (40 mEq total) by mouth 2 (two) times daily. Take 40 meq in am and 20 meq in pm  120 tablet 3  . senna-docusate (SENOKOT S) 8.6-50 MG tablet Take 2 tablets by mouth at bedtime as needed for mild constipation. 30 tablet 6  . spironolactone (ALDACTONE) 25 MG tablet Take 1 tablet (25 mg total) by mouth daily. 30 tablet 6  . sucralfate (CARAFATE) 1 GM/10ML suspension Take 10 mLs (1 g total) by mouth 4 (four) times daily -  with meals and at bedtime. 420 mL 6  . torsemide (DEMADEX) 100 MG tablet Take 1 tablet (100 mg total) by mouth 2 (two) times daily. Take 100mg  in the morning and 50mg  in the evening 60 tablet 6  . traZODone (DESYREL) 50 MG tablet Take 1 tablet (50 mg total) by mouth at bedtime as needed for sleep. 30 tablet 6  . warfarin (COUMADIN) 5 MG tablet Take 5-7.5 mg by mouth See admin instructions. Take 1 and 1/2 tablets (7.5 mg) daily except 1 tablet (5 mg) on Monday, Wednesday  and Friday      No current facility-administered medications for this encounter.     Chlorhexidine gluconate; Codeine; Lipitor [atorvastatin]; and Tape  REVIEW OF SYSTEMS: All systems negative except as listed in HPI, PMH and Problem list.     Vitals:   02/23/18 1011  BP: (!) 84/0    Wt Readings from Last 3 Encounters:  02/23/18 236 lb (107 kg)  02/03/18 208 lb 12.8 oz (94.7 kg)  01/27/18 189 lb 6.4 oz (85.9 kg)     Vital Signs:  Doppler Pressure: 84 Automatc BP:  114.63 (72) HR: 77   Weight: 236208.4 lbs w/o eqt Last weight: 208.4 lbs last clinic visit   .Physical Exam: GENERAL: Appears chronically ill. Dad present.  HEENT: normal  NECK: Supple, JVP to jaw.  2+ bilaterally, no bruits.  No lymphadenopathy or thyromegaly appreciated.   CARDIAC:  Mechanical heart sounds with LVAD hum present. R upper chest  Tunneled PICC.  LUNGS:  Clear to auscultation bilaterally.  ABDOMEN:  Soft, round, nontender, positive bowel sounds x4. +++. Distended     LVAD exit site: well-healed and incorporated.  Dressing dry and intact.  No erythema or drainage.  Stabilization device present  and accurately applied.  Driveline dressing is being changed daily per sterile technique. EXTREMITIES:  Warm and dry, no cyanosis, clubbing, rash. R and LLE 2+  edema  NEUROLOGIC:  Alert and oriented x 4.  Gait steady.  No aphasia.  No dysarthria.  Affect pleasant.   + asterixis     ASSESSMENT AND PLAN: 1. Acute/Chronic end-stage biventricular systolic HF: LVEF 92% due to ICM -> s/p Echo 08/24/16 LVEF 15%, RV mild dilated, moderately reduced. 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  Has been turned down for transplant at [facility].  - Complicated by RV failure. Failed milrinone due to persistent RV failure/shock..   - Remains on dobutamine 7.5 mcg and midodrine.  - He is s/p recent prolonged hospitalization due to recurrent HF with marked volume overload and hepatic encephalopathy in setting of disconnecting his own dobutamine line and medication noncomplaince. Hospitalization c/b unwitnessed fall and self d/c of tunneled PICC. -Just discharged from SNF a week ago. Marked volume overload.  - - NYHA III-IIIB  - Volume status trending up despite high dose diuretics. Will need IV lasix.  - Continue midodrine 10 TID for low MAPs and cirrhotic physiology.   - Renal function trending up.  - Off sildenafil due to hypotension - No beta blocker with RV failure.  - VAD interrogated personally. Parameters stable. - Warfarin with goal INR 2.0 - 2.5 + ASA 81 daily.  INR 2.2. - Driveline site ok - LDH 155 - VAD interrogated personally. Parameters stable. - Not transplant candidate due to social issues, noncompliance and cirrhosis (has been turned down by [facility]) 2. CAD: Severe 3v- CAD s/p CABG with occluded grafts as above except for LIMA.  -No s/s ischemia. Continue current therapy.  3. DMII:  - Per PCP 4. Tobacco abuse:  - Reports complete cessation 5. Atrial fibrillation, paroxysmal - Remains in NSR  Continue amio 200 daily. Careful with cirrhosis  6. Left pleural effusion - underwent repeat thoracentesis in Jan.  2018.  - no recurrence. No effusion on exam today  7.  Anxiety/Depression - Continue Celexa 20mg  daily . - Social issues still a concern  8. Cirrhosis with hepatic encephalopathy - likely combination ETOH and chronic RV failure -No asterictixis. - Ammonia 30. Continue lactulose at 45 tid - Set up Paracentesis today  10. Anemia, iron-deficiency:   - Has had GI work up with EGD and Capsule Endoscopy showing severe gastropath No evidence of active bleeding. Continue carafate.  I personally spoke to his sister and she is adamant that they have been doing everything they can.  I have informed her that we will consult Paramedicine for [place].   Given multiple admits over the last 6 months he is clearly declining.   Admit to [facility] for IV diuresis. Consult Paramedicine. Give 80 mg IV lasix in the clinic.  Hosam Mcfetridge NP-C 11:02 AM

## 2018-02-23 NOTE — Progress Notes (Signed)
CSW met with patient in the clinic. Patient came to clinic with his Father for weekly visit. He was discharged from SNF last Saturday and reports he is fluid overloaded and feeling down. CSW and patient discussed goals of care at length and patient adamant about not being intubated. Patient states he is considering returning to SNF as he now realizes his care is challenging and doesn't want to be a burden on his sister. CSW spoke with his sister via phone to update on hospital admission due to overload and patient's wishes to return to SNF. Patient to be admitted today. CSW available as needed. Raquel Sarna, [place], [place]

## 2018-02-23 NOTE — Progress Notes (Signed)
Patient admitted from [facility] clinic. Please see admit H&P.   Glori Bickers, MD  10:56 PM

## 2018-02-23 NOTE — Progress Notes (Signed)
ANTICOAGULATION CONSULT NOTE - Initial Consult  Pharmacy Consult for warfarin Indication: LVAD  Allergies  Allergen Reactions  . Chlorhexidine Gluconate Itching and Rash  . Codeine Nausea And Vomiting and Other (See Comments)    In high doses  . Lipitor [Atorvastatin] Nausea Only and Other (See Comments)    Nausea with high doses, tolerates 20mg  dose (08/22/16)  . Tape Other (See Comments)    Paper tape is ok    Patient Measurements:    Vital Signs: BP: 84/0 (05/23 1011) Pulse Rate: 77 (05/23 1011)  Labs: Recent Labs    02/23/18 1022  HGB 8.5*  HCT 30.2*  PLT 177  LABPROT 24.2*  INR 2.19  CREATININE 1.64*    Estimated Creatinine Clearance: 57 mL/min (A) (by C-G formula based on SCr of 1.64 mg/dL (H)).   Medical History: Past Medical History:  Diagnosis Date  . AICD (automatic cardioverter/defibrillator) present   . Anxiety   . ASCVD (arteriosclerotic cardiovascular disease)   . Chronic systolic CHF (congestive heart failure) ([place])   . COPD (chronic obstructive pulmonary disease) ([place])   . Coronary artery disease   . Depression   . GI bleed   . High cholesterol   . History of blood transfusion   . History of cocaine abuse   . Hypertension   . LVAD (left ventricular assist device) present ([place])   . Presence of permanent cardiac pacemaker   . Shortness of breath dyspnea   . Suicidal ideation   . Tobacco abuse   . Type II diabetes mellitus ([facility])      Assessment: 72 yoM s/p HM3 LVAD on warfarin PTA. INR therapeutic on admit at 2.19, per pt he "has taken all daily meds already today" so will hold warfarin tonight. LDH stable, CBC at baseline.  PTA Dose = 5mg  Mon/Fri, 7.5mg  all other days  Goal of Therapy:  INR 1.8-2.4 Monitor platelets by anticoagulation protocol: Yes   Plan:  -No warfarin tonight as pt has already had dose PTA -Daily protime  Arrie Senate, PharmD, BCPS PGY-2 Cardiology Pharmacy Resident Pager: [phone] 02/23/2018

## 2018-02-23 NOTE — Progress Notes (Addendum)
Pt presents to clinic for his 1 week hospital follow up with his Father. Pt was discharged from [facility] this past Saturday. Pt is up 30 pounds since his last visit with [place].    Vital Signs:  Doppler Pressure: 84  Automatc BP:  114/63 (72) HR: 77 SPO2: UTO%  Weight: 236 lbs w/o eqt Last weight: 208.4 Lbs last clinic visit  VAD Indication: Destination Therapy - eval complete at [facility]  VAD interrogation & Equipment Management: Speed: 5800 Flow: 5.5 Power:4.6 w    PI: 2.2 Hct: 28  Alarms: 4 no external powers-pt states that he accidentally double disconnected Events: 10 - 20 daily  Fixed speed 5800 Low speed limit: 5500  Primary Controller:  Replace back up battery in 20 months. Back up controller:  Replace back battery in 20 months.   Annual Equipment Maintenance on UBC/PM was performed on 09/2017.   I reviewed the LVAD parameters from today and compared the results to the patient's prior recorded data. LVAD interrogation was NEGATIVE for significant power changes, NEGATIVE for clinical alarms and STABLE for PI events/speed drops. No programming changes were made and pump is functioning within specified parameters. Pt is performing daily controller and system monitor self tests along with completing weekly and monthly maintenance for LVAD equipment.  LVAD equipment check completed and is in good working order. Back-up equipmentt present today.   Exit Site Care: Existing VAD dressing removed and site care performed using sterile technique. Drive line exit site cleaned with Betadine swab x 1, rinsed with sterile saline, allowed to dry, and Sorbaview dressing with bio patch re-applied. Exit site healed and incorporated, the velour is fully implanted at exit site. No redness, tenderness, drainage, foul odor or rash noted. Drive line anchor re-applied.    Significant Events on VAD Support:  11/30/16> Milrinone gtt at discharge 01/27/17>dobutamine at 5 mcg/kg/mingtt at  discharge 03/08/17> RV failure 04/29/17> symptomatic anemia and melena. EGD revealed stomach ulcers. Carafate added; ASA stopped 05/31/17>Ongoing symptomatic anemia and melena; capsule study mild antral gastritis; poss tiny AVM right colon. Lower MAPS, Losartan cut back. 06/2017> admit with syncope, AMS, Dobutamine at 7.16mcg 11/09/17>Marked ascites, RV failure, and hepatic encephalopathy. Paracentesis on 2/7 and 2/12 with >11 liters fluid removed  Device:Protect Therapies: off; end of service 08/2016 Last check: complete interrogation 05/26/2017. DDD (lower rate 60); BiV pacing with 96% AS-VP  BP & Labs:  MAP 84 - Doppler is reflecting MAP  Hgb 8.5 - No S/S of bleeding. Specifically denies melena/BRBPR or nosebleeds.  LDH stable at 155 with established baseline of 150- 250. Denies tea-colored urine. No power elevations noted on interrogation.   Pt given 80mg  of IV Lasix while waiting for 2C bed.  Patient Instructions: 1. Admit pt to [facility] for fluid volume overload and paracentesis.   Tanda Rockers RN [place] Coordinator   Office: [phone] 24/7 Emergency VAD Pager: [phone]

## 2018-02-23 NOTE — H&P (Addendum)
Advanced Heart Failure VAD History and Physical Note   PCP-Cardiologist: Glori Bickers, MD   Reason for Admission: A/C Biventricular Heart Failure  HPI:   RAYQUAN AMRHEIN Jr.is a 62 y.o.male with history of chronic systolic HF s/p Medtronic ICD 2014, CAD s/p CABG in 2000, HTN, Hx of cocaine abuse, Tobacco abuse, Depression, PAF, and COPD. HM3 placed 09/09/2016. In April 2018 he was placed on milrinone but later switched to dobutamine. He remains on dobutamine 7.5 mcg.  Admitted 2/6-2/18/2019 with recurrent volume overload, RHF and ascites despite dobutamine support. Underwent paracentesis on 2/7 and 2/12. He had over 11 liters removed from paracentesis. Diuresed with IV lasix and transitioned back to home torsemide 100 mg/50mg  daily. Overall he lost 47 pounds. Also treated for hepatic encephalopathy. MAPs were soft with diuresis so midodrine increased to 10 tid. Weight on d/c 195 pounds.   Admitted from clinic4/9/19-4/26/19with encephalopathy and acute on chronic CHF.Pt had somehow disconnected his dobutamine. The line was "fractured" in 2 places. Weight up 30+ pounds from baseline. Dobutamine restarted and patient diuresed with lasix gtt and metolazone. Diamox added. Pt required Paracentesis 4/10 AND 01/16/18 with 4.8L and 2.4 L off, respectively.  Overall, Pt down 31lb from admit in setting of diuresis and paracentesis. Transitioned back to torsemide 100 mg BID with metolazone as needed. Also had recurrent hepatic encephalopathy on admit.  Ammonia elevated at 72 on admit lactulose increase to 45 tid and improved.  Hospital course complicated by unwitnessed fall 01/2015. CT of head negative as well as self-dislodgment of tunneled PICC line that was replaced by IR. Pt also noted to have hyponatremia down to 120. Started on Demeclocycline 150 mg BID and free water restricted.This was later stopped as sodium improved. Discharged to [facility] as it was felt he was unable to care for  himself. Discharge weight 189 pounds.  Today he presented to the VAD clinic for follow up. On Saturday he was discharged from [facility] SNF. SOB with exertion. + Orthopnea. + Bendopnea. Marked abdominal bloating. Weight up nearly 40 pounds. Denies PND. Drinking lots of fluids. His sister has been providing low salt food. Denies fever or chills. No bleeding problems. No BRBPR. Living with his sister. She has has been preparing his medications. He has been followed by [facility] for home dobutamine.   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  06/01/2017- Capsule Endoscopy- normal except  tiny angiectasia, nonbleeding in the right colon. 4/10 and 01/16/2018 Paracentesis   LVAD INTERROGATION:  HeartMate II LVAD:  Flow 5.5 liters/min, speed 5800, power 4.6  PI 2.2 .  10-20 events per day.    Review of Systems: [y] = yes, [ ]  = no   General: Weight gain [Y ]; Weight loss [ ] ; Anorexia [ ] ; Fatigue [ Y]; Fever [ ] ; Chills [ ] ; Weakness [ Y]  Cardiac: Chest pain/pressure [ ] ; Resting SOB [ ] ; Exertional SOB [ Y]; Orthopnea [Y ]; Pedal Edema [ Y]; Palpitations [ ] ; Syncope [ ] ; Presyncope [ ] ; Paroxysmal nocturnal dyspnea[ ]   Pulmonary: Cough [ ] ; Wheezing[ ] ; Hemoptysis[ ] ; Sputum [ ] ; Snoring [ Y]  GI: Vomiting[ ] ; Dysphagia[ ] ; Melena[ ] ; Hematochezia [ ] ; Heartburn[ ] ; Abdominal pain [ ] ; Constipation [ ] ; Diarrhea [ ] ; BRBPR [ ]   GU: Hematuria[ ] ; Dysuria [ ] ; Nocturia[ ]   Vascular: Pain in legs with walking [ ] ; Pain in feet with lying flat [ ] ; Non-healing sores [ ] ;  Stroke [ ] ; TIA [ ] ; Slurred speech [ ] ;  Neuro: Headaches[ ] ; Vertigo[ ] ; Seizures[ ] ; Paresthesias[ ] ;Blurred vision [ ] ; Diplopia [ ] ; Vision changes [ ]   Ortho/Skin: Arthritis [ ] ; Joint pain [ ] ; Muscle pain [ ] ; Joint swelling [ ] ; Back Pain [Y ]; Rash [ ]   Psych: Depression[ Y]; Anxiety[ ]   Heme: Bleeding problems [ ] ; Clotting disorders [ ] ; Anemia [Y ]    Endocrine: Diabetes [ Y]; Thyroid dysfunction[ ]     Home Medications Prior to Admission medications   Medication Sig Start Date End Date Taking? Authorizing Provider  acetaminophen (TYLENOL) 500 MG tablet Take 1,000 mg by mouth every 6 (six) hours as needed for moderate pain or headache.    [provider]  amiodarone (PACERONE) 200 MG tablet TAKE ONE TABLET BY MOUTH ONCE DAILY 10/31/17   Makayleigh Poliquin, Shaune Pascal, MD  citalopram (CELEXA) 20 MG tablet Take 1 tablet (20 mg total) by mouth daily. 05/09/17   Clegg, Amy D, NP  cyclobenzaprine (FLEXERIL) 10 MG tablet Take 1 tablet (10 mg total) by mouth 3 (three) times daily as needed for muscle spasms. 11/30/17   Larey Dresser, MD  digoxin (DIGOX) 0.125 MG tablet Take 1 tablet (0.125 mg total) daily by mouth. 08/18/17   Maven Rosander, Shaune Pascal, MD  DOBUTamine (DOBUTREX) 4-5 MG/ML-% infusion Inject 714.75 mcg/min into the vein continuous. PER [facility] pharmacy 11/21/17   Darrick Grinder D, NP  docusate sodium (COLACE) 100 MG capsule Take 200 mg by mouth at bedtime as needed for mild constipation.     [provider]  gabapentin (NEURONTIN) 300 MG capsule Take 1 capsule (300 mg total) by mouth 2 (two) times daily. 09/30/17   Deontray Hunnicutt, Shaune Pascal, MD  insulin aspart (NOVOLOG FLEXPEN) 100 UNIT/ML FlexPen Inject 5 Units into the skin 3 (three) times daily with meals. 05/09/17   Clegg, Amy D, NP  Insulin Glargine (LANTUS SOLOSTAR) 100 UNIT/ML Solostar Pen Inject 10 Units into the skin daily at 10 pm. 11/21/17   Clegg, Amy D, NP  lactulose (CHRONULAC) 10 GM/15ML solution Take 67.5 mLs (45 g total) by mouth 3 (three) times daily. 01/27/18   Clegg, Amy D, NP  magnesium oxide (MAG-OX) 400 (241.3 Mg) MG tablet Take 1 tablet (400 mg total) by mouth daily. 05/09/17   Clegg, Amy D, NP  metolazone (ZAROXOLYN) 2.5 MG tablet Take 2.5 mg by mouth See admin instructions. Take one tablet (2.5mg ) by mouth on Fridays and Mondays.    [provider]  midodrine (PROAMATINE)  10 MG tablet Take 1 tablet (10 mg total) by mouth 3 (three) times daily with meals. 11/21/17   Clegg, Amy D, NP  Multiple Vitamin (MULTIVITAMIN WITH MINERALS) TABS tablet Take 1 tablet by mouth daily.    [provider]  ondansetron (ZOFRAN ODT) 4 MG disintegrating tablet Take 1 tablet (4 mg total) by mouth every 8 (eight) hours as needed for nausea. 04/10/17   Tanna Furry, MD  pantoprazole (PROTONIX) 40 MG tablet TAKE 1 TABLET BY MOUTH TWICE DAILY WITH A MEAL 01/05/18   Larkin Morelos, Shaune Pascal, MD  potassium chloride SA (K-DUR,KLOR-CON) 20 MEQ tablet Take 2 tablets (40 mEq total) by mouth 2 (two) times daily. Take 40 meq in am and 20 meq in pm 12/30/17   Clegg, Amy D, NP  senna-docusate (SENOKOT S) 8.6-50 MG tablet Take 2 tablets by mouth at bedtime as needed for mild constipation. 05/09/17   Clegg, Amy D, NP  spironolactone (ALDACTONE) 25  MG tablet Take 1 tablet (25 mg total) by mouth daily. 01/27/18   Clegg, Amy D, NP  sucralfate (CARAFATE) 1 GM/10ML suspension Take 10 mLs (1 g total) by mouth 4 (four) times daily -  with meals and at bedtime. 05/09/17   Clegg, Amy D, NP  torsemide (DEMADEX) 100 MG tablet Take 1 tablet (100 mg total) by mouth 2 (two) times daily. Take 100mg  in the morning and 50mg  in the evening 01/27/18   Clegg, Amy D, NP  traZODone (DESYREL) 50 MG tablet Take 1 tablet (50 mg total) by mouth at bedtime as needed for sleep. 12/20/17   [name] Friar, PA-C  warfarin (COUMADIN) 5 MG tablet Take 5-7.5 mg by mouth See admin instructions. Take 1 and 1/2 tablets (7.5 mg) daily except 1 tablet (5 mg) on Monday, Wednesday and Friday     [provider]    Past Medical History: Past Medical History:  Diagnosis Date  . AICD (automatic cardioverter/defibrillator) present   . Anxiety   . ASCVD (arteriosclerotic cardiovascular disease)   . Chronic systolic CHF (congestive heart failure) ([place])   . COPD (chronic obstructive pulmonary disease) ([place])   . Coronary artery disease   .  Depression   . GI bleed   . High cholesterol   . History of blood transfusion   . History of cocaine abuse   . Hypertension   . LVAD (left ventricular assist device) present ([place])   . Presence of permanent cardiac pacemaker   . Shortness of breath dyspnea   . Suicidal ideation   . Tobacco abuse   . Type II diabetes mellitus ([place])     Past Surgical History: Past Surgical History:  Procedure Laterality Date  . BACK SURGERY    . CARDIAC CATHETERIZATION N/A 02/12/2016   Procedure: Right/Left Heart Cath and Coronary/Graft Angiography;  Surgeon: Jolaine Artist, MD;  Location: [place] [facility];  Service: Cardiovascular;  Laterality: N/A;  . CARDIAC CATHETERIZATION N/A 03/22/2016   Procedure: Right Heart Cath;  Surgeon: Jolaine Artist, MD;  Location: [place] [facility];  Service: Cardiovascular;  Laterality: N/A;  . CARDIAC CATHETERIZATION N/A 08/25/2016   Procedure: Right Heart Cath;  Surgeon: Jolaine Artist, MD;  Location: [place] [facility];  Service: Cardiovascular;  Laterality: N/A;  . CARDIAC CATHETERIZATION N/A 09/03/2016   Procedure: Right Heart Cath;  Surgeon: Jolaine Artist, MD;  Location: [place] [facility];  Service: Cardiovascular;  Laterality: N/A;  . CARDIAC DEFIBRILLATOR PLACEMENT    . CARDIAC DEFIBRILLATOR PLACEMENT    . CHOLECYSTECTOMY N/A 08/30/2015   Procedure: LAPAROSCOPIC CHOLECYSTECTOMY;  Surgeon: Hubbard Robinson, MD;  Location: [name];  Service: General;  Laterality: N/A;  . COLONOSCOPY WITH PROPOFOL N/A 08/18/2016   Procedure: COLONOSCOPY WITH PROPOFOL;  Surgeon: Jerene Bears, MD;  Location: [place];  Service: Gastroenterology;  Laterality: N/A;  . CORONARY ARTERY BYPASS GRAFT  2001  . ENTEROSCOPY N/A 05/02/2017   Procedure: ENTEROSCOPY;  Surgeon: Mauri Pole, MD;  Location: [facility] ENDOSCOPY;  Service: Endoscopy;  Laterality: N/A;  . GIVENS CAPSULE STUDY N/A 06/01/2017   Procedure: GIVENS CAPSULE STUDY;  Surgeon: Jerene Bears, MD;   Location: [place];  Service: Gastroenterology;  Laterality: N/A;  . INSERT / REPLACE / REMOVE PACEMAKER    . INSERTION OF IMPLANTABLE LEFT VENTRICULAR ASSIST DEVICE N/A 09/09/2016   Procedure: INSERTION OF IMPLANTABLE LEFT VENTRICULAR ASSIST DEVICE;  Surgeon: [name], MD;  Location: [place];  Service: Open  Heart Surgery;  Laterality: N/A;  HeartMate 3 CIRC ARREST  NITRIC OXIDE  . IR FLUORO GUIDE CV LINE RIGHT  01/28/2017  . IR FLUORO GUIDE CV LINE RIGHT  01/26/2018  . IR GENERIC HISTORICAL  08/03/2016   IR FLUORO GUIDE CV LINE RIGHT 08/03/2016 [name], MD [facility]  . IR GENERIC HISTORICAL  12/14/2016   IR FLUORO GUIDE CV LINE RIGHT 12/14/2016 [name], MD [facility]  . IR PARACENTESIS  11/10/2017  . IR PARACENTESIS  11/15/2017  . IR PARACENTESIS  12/27/2017  . IR PARACENTESIS  01/11/2018  . IR PARACENTESIS  01/16/2018  . IR PARACENTESIS  02/15/2018  . IR US GUIDE VASC ACCESS RIGHT  01/28/2017  . IR US GUIDE VASC ACCESS RIGHT  01/26/2018  . LUMBAR DISC SURGERY  02/1999   Discectomy and fusion  . RIGHT HEART CATH N/A 02/11/2017   Procedure: Right Heart Cath;  Surgeon: Jolaine Artist, MD;  Location: [place] [facility];  Service: Cardiovascular;  Laterality: N/A;  . TEE WITHOUT CARDIOVERSION N/A 09/09/2016   Procedure: TRANSESOPHAGEAL ECHOCARDIOGRAM (TEE);  Surgeon: [name], MD;  Location: [place];  Service: Open Heart Surgery;  Laterality: N/A;  . VASECTOMY     Subsequent reversal    Family History: Family History  Problem Relation Age of Onset  . Arthritis Mother   . Hyperlipidemia Mother   . CAD Father   . Diabetes Father   . Heart failure Father   . Alcohol abuse Father   . Hyperlipidemia Father   . Hyperlipidemia Maternal Grandmother   . Hyperlipidemia Maternal Grandfather   . Hyperlipidemia Paternal Grandmother   . Hyperlipidemia Paternal Grandfather     Social History: Social History   Socioeconomic History  . Marital status: Divorced      Spouse name: Not on file  . Number of children: 1  . Years of education: 43  . Highest education level: Not on file  Occupational History  . Occupation: Retired  . Occupation: Truck [employer] operational officer  . Financial resource strain: Not on file  . Food insecurity:    Worry: Not on file    Inability: Not on file  . Transportation needs:    Medical: Not on file    Non-medical: Not on file  Tobacco Use  . Smoking status: Former Smoker    Packs/day: 1.50    Years: 23.00    Pack years: 34.50    Types: Cigarettes    Start date: 10/24/1992    Last attempt to quit: 07/01/2016    Years since quitting: 1.6  . Smokeless tobacco: Never Used  Substance and Sexual Activity  . Alcohol use: No    Alcohol/week: 0.0 oz  . Drug use: Yes    Frequency: 0.1 times per week    Types: Cocaine    Comment: 07/01/2016 last use relapse after 8 yrs  . Sexual activity: Not Currently  Lifestyle  . Physical activity:    Days per week: Not on file    Minutes per session: Not on file  . Stress: Not on file  Relationships  . Social connections:    Talks on phone: Not on file    Gets together: Not on file    Attends religious service: Not on file    Active member of club or organization: Not on file    Attends meetings of clubs or organizations: Not on file    Relationship status: Not on file  Other Topics Concern  .  Not on file  Social History Narrative   Divorced with one child   No regular exercise    Allergies:  Allergies  Allergen Reactions  . Chlorhexidine Gluconate Itching and Rash  . Codeine Nausea And Vomiting and Other (See Comments)    In high doses  . Lipitor [Atorvastatin] Nausea Only and Other (See Comments)    Nausea with high doses, tolerates 20mg  dose (08/22/16)  . Tape Other (See Comments)    Paper tape is ok    Objective:    Vital Signs:       There were no vitals filed for this visit.  Mean arterial Pressure 80s  Physical Exam    General:  Pale. Appears  chronically ill. No resp difficulty HEENT: Normal Anicteric  Neck: supple. JVP to jaw . Carotids 2+ bilat; no bruits. No lymphadenopathy or thyromegaly appreciated. Cor: Mechanical heart sounds with LVAD hum present.R upper chest tunneled PICC Lungs: Clear decreased at bases Abdomen: obese soft, nontender, +++distended. No hepatosplenomegaly. No bruits or masses. Good bowel sounds. Driveline: C/D/I; securement device intact and driveline incorporated Extremities: no cyanosis, clubbing, rash,  R and LLE 3+ edema (tight) Neuro: alert & oriented x 3, cranial nerves grossly intact. moves all 4 extremities w/o difficulty. Affect flat. No asterixis   Telemetry  Sinus Brady 50s   EKG  n/a  Labs    Basic Metabolic Panel: Recent Labs  Lab 02/23/18 1022  NA 132*  K 4.5  CL 93*  CO2 31  GLUCOSE 186*  BUN 32*  CREATININE 1.64*  CALCIUM 8.5*    Liver Function Tests: Recent Labs  Lab 02/23/18 1022  AST 25  ALT 17  ALKPHOS 94  BILITOT 1.0  PROT 6.7  ALBUMIN 3.0*   No results for input(s): LIPASE, AMYLASE in the last 168 hours. Recent Labs  Lab 02/23/18 1022  AMMONIA 30    CBC: Recent Labs  Lab 02/23/18 1022  WBC 7.6  HGB 8.5*  HCT 30.2*  MCV 78.9  PLT 177    Cardiac Enzymes: No results for input(s): CKTOTAL, CKMB, CKMBINDEX, TROPONINI in the last 168 hours.  BNP: BNP (last 3 results) No results for input(s): BNP in the last 8760 hours.  ProBNP (last 3 results) No results for input(s): PROBNP in the last 8760 hours.   CBG: No results for input(s): GLUCAP in the last 168 hours.  Coagulation Studies: Recent Labs    02/23/18 1022  LABPROT 24.2*  INR 2.19    Other results: EKG: n/a  Imaging     No results found.    Patient Profile:  MEILECH VIRTS Jr.is a 62 y.o.male with history of chronic systolic HF s/p Medtronic ICD 2014, CAD s/p CABG in 2000, HTN, Hx of cocaine abuse, Tobacco abuse, Depression, PAF, and COPD. HM3 placed 09/09/2016. In  April 2018 he was placed on milrinone but later switched to dobutamine. He remains on dobutamine 7.62mcg  Admitting today with marked volume overload.    Assessment/Plan:     1. Acute/Chronic end-stage biventricular systolic HF: LVEF 16% due to ICM -> s/p Echo 08/24/16 LVEF 15%, RV mild dilated, moderately reduced. -->S/P HM3 LVAD 09/09/2016.  Has been turned down for transplant at [facility].  - Complicated by RV failure. Failed milrinone due to persistent RV failure/shock..   - Remains on dobutamine 7.5 mcg and midodrine.  - He is s/p recent prolonged hospitalization due to recurrent HF with marked volume overload and hepatic encephalopathy in setting of disconnecting his own dobutamine  line and medication noncomplaince. Hospitalization c/b unwitnessed fall and self d/c of tunneled PICC. -Just discharged from SNF a week ago. Marked volume overload.  - Volume status trending up despite high dose diuretics. Start lasix drip at 15 mg per hour. Last hospital admit he was on lasix drip + metolazone.  - Continue midodrine 10 TID for low MAPs and cirrhotic physiology.   - Off sildenafil due to hypotension - No beta blocker with RV failure.  - VAD interrogated personally. Parameters stable. - Warfarin with goal INR 2.0 - 2.5 + ASA 81 daily.  INR 2.2. - Driveline site ok - LDH 155 - VAD interrogated personally. Parameters stable. - Not transplant candidate due to social issues, noncompliance and cirrhosis (has been turned down by [name]) 2. CAD: Severe 3v- CAD s/p CABG with occluded grafts as above except for LIMA.  -No s/s ischemia. Continue current therapy.  3. DMII:  - Continue insulin regimen. Start sliding scale.  4. Tobacco abuse:  - Reports complete cessation 5. Atrial fibrillation, paroxysmal - Remains in NSR  Continue amio 200 daily.  - Check TSH in am.  Careful with cirrhosis  6. Left pleural effusion - underwent repeat thoracentesis in Jan. 2018.  - no recurrence. No effusion on exam  today  7.  Anxiety/Depression - Continue Celexa 20mg  daily . - Palliative Care consult.  8. Cirrhosis with hepatic encephalopathy - likely combination ETOH and chronic RV failure -No asterictixis. -Ammonia level 30. Continue lactulose at 45 tid - Set up Paracentesis today  10. Anemia, iron-deficiency:   - Has had GI work up with EGD and Capsule Endoscopy showing severe gastropath No evidence of active bleeding. Continue carafate. Follow daily CBC.  I had a lengthy conversation with his sister. She has stressed that they are providing low salt diet and trying limit fluids. Coralyn Mark said she is very overwhelmed because they are moving everything out his apartment.   Palliative Care consult for [place].   I reviewed the LVAD parameters from today, and compared the results to the patient's prior recorded data.  No programming changes were made.  The LVAD is functioning within specified parameters.  The patient performs LVAD self-test daily.  LVAD interrogation was negative for any significant power changes, alarms or PI events/speed drops.  LVAD equipment check completed and is in good working order.  Back-up equipment present.   LVAD education done on emergency procedures and precautions and reviewed exit site care.  Length of Stay: 0  Darrick Grinder, NP 02/23/2018, 1:34 PM  VAD Team Pager [phone] (7am - 7am) +++VAD ISSUES ONLY+++   Advanced Heart Failure Team Pager [phone] (M-F; [place])  Please contact [facility] for night-coverage after hours (4p -7a ) and weekends on [email or web address] for all non- LVAD Issues  Patient seen and examined with Darrick Grinder, NP. We discussed all aspects of the encounter. I agree with the assessment and plan as stated above.   62 y/o male with severe iCM s/p HM3 VAD c/p end-stage RV failure requiring high-dose home dobutamine support. Recently discharged from [facility] after extended hospital stay   Now markedly volume overloaded and has re-accumulated over 40  pounds in just a week or two. He has marked LE edema and ascites on exam in the setting of severe dietary indiscretion. Long talk about Hospice versus continuing aggressive medical care. He says he is not ready to die yet. Will admit for paracentesis and IV diuresis. Continue lactulose for hepatic encephalopathy. Continue dobutamine for RV support  and midodrine for BP support.   Once tuned up will be discharged back to [facility] for long-term care. VAD interrogated personally. Parameters stable.  Glori Bickers, MD  3:10 PM

## 2018-02-23 NOTE — Procedures (Signed)
PROCEDURE SUMMARY:  Successful US guided paracentesis from left lateral abdomen.  Yielded 5.6 liters of clear yellow fluid.  No immediate complications.  Patient tolerated well.   Judie Grieve Mckale Haffey PA-C 02/23/2018 4:41 PM

## 2018-02-23 NOTE — Progress Notes (Signed)
CSW met with patient in the clinic. Patient reports he is now staying permanently with his sister. He reports "I will make due". Patient is working on emptying his apartment this month and terminating he lease. Patient will continue to come to clinic weekly for dressing changes and supervision. His father transported him today and states he will make arrangements to for future transportation needs. Patient verbalizes agreement to plan and will call CSW if further needs arise. CSW continues to follow for supportive needs. Raquel Sarna, [place], [place]

## 2018-02-24 DIAGNOSIS — Z7189 Other specified counseling: Secondary | ICD-10-CM

## 2018-02-24 DIAGNOSIS — I50810 Right heart failure, unspecified: Secondary | ICD-10-CM

## 2018-02-24 DIAGNOSIS — Z515 Encounter for palliative care: Secondary | ICD-10-CM

## 2018-02-24 DIAGNOSIS — R531 Weakness: Secondary | ICD-10-CM

## 2018-02-24 LAB — BASIC METABOLIC PANEL
ANION GAP: 11 (ref 5–15)
BUN: 30 mg/dL — ABNORMAL HIGH (ref 6–20)
CO2: 29 mmol/L (ref 22–32)
CREATININE: 1.55 mg/dL — AB (ref 0.61–1.24)
Calcium: 8 mg/dL — ABNORMAL LOW (ref 8.9–10.3)
Chloride: 90 mmol/L — ABNORMAL LOW (ref 101–111)
GFR, EST AFRICAN AMERICAN: 54 mL/min — AB (ref >60)
GFR, EST NON AFRICAN AMERICAN: 47 mL/min — AB (ref >60)
GLUCOSE: 176 mg/dL — AB (ref 65–99)
Potassium: 4.5 mmol/L (ref 3.5–5.1)
Sodium: 130 mmol/L — ABNORMAL LOW (ref 135–145)

## 2018-02-24 LAB — CBC
HCT: 27.9 % — ABNORMAL LOW (ref 39.0–52.0)
HEMOGLOBIN: 8.1 g/dL — AB (ref 13.0–17.0)
MCH: 22.7 pg — AB (ref 26.0–34.0)
MCHC: 29 g/dL — ABNORMAL LOW (ref 30.0–36.0)
MCV: 78.2 fL (ref 78.0–100.0)
PLATELETS: 159 10*3/uL (ref 150–400)
RBC: 3.57 MIL/uL — AB (ref 4.22–5.81)
RDW: 18 % — ABNORMAL HIGH (ref 11.5–15.5)
WBC: 7.4 10*3/uL (ref 4.0–10.5)

## 2018-02-24 LAB — GLUCOSE, CAPILLARY
GLUCOSE-CAPILLARY: 162 mg/dL — AB (ref 65–99)
GLUCOSE-CAPILLARY: 195 mg/dL — AB (ref 65–99)
GLUCOSE-CAPILLARY: 141 mg/dL — AB (ref 65–99)
GLUCOSE-CAPILLARY: 192 mg/dL — AB (ref 65–99)

## 2018-02-24 LAB — COOXEMETRY PANEL
Carboxyhemoglobin: 1.5 % (ref 0.5–1.5)
Methemoglobin: 1.4 % (ref 0.0–1.5)
O2 SAT: 45.9 %
Total hemoglobin: 11 g/dL — ABNORMAL LOW (ref 12.0–16.0)

## 2018-02-24 LAB — LACTATE DEHYDROGENASE: LDH: 156 U/L (ref 98–192)

## 2018-02-24 LAB — PROTIME-INR
INR: 2.61
PROTHROMBIN TIME: 27.7 s — AB (ref 11.4–15.2)

## 2018-02-24 LAB — MAGNESIUM: Magnesium: 2.1 mg/dL (ref 1.7–2.4)

## 2018-02-24 LAB — HIV ANTIBODY (ROUTINE TESTING W REFLEX): HIV Screen 4th Generation wRfx: NONREACTIVE

## 2018-02-24 MED ORDER — WARFARIN SODIUM 5 MG PO TABS
5.0000 mg | ORAL_TABLET | Freq: Once | ORAL | Status: AC
Start: 2018-02-24 — End: 2018-02-24
  Administered 2018-02-24: 5 mg via ORAL
  Filled 2018-02-24: qty 1

## 2018-02-24 MED ORDER — DOBUTAMINE IN D5W 4-5 MG/ML-% IV SOLN
7.5000 ug/kg/min | INTRAVENOUS | Status: DC
  Administered 2018-02-24 – 2018-03-01 (×7): 7.5 ug/kg/min via INTRAVENOUS
  Filled 2018-02-24 (×7): qty 250

## 2018-02-24 MED ORDER — METOLAZONE 2.5 MG PO TABS
2.5000 mg | ORAL_TABLET | Freq: Every day | ORAL | Status: DC
  Administered 2018-02-24 – 2018-02-28 (×5): 2.5 mg via ORAL
  Filled 2018-02-24 (×5): qty 1

## 2018-02-24 MED ORDER — WARFARIN - PHARMACIST DOSING INPATIENT
Freq: Every day | Status: DC
  Administered 2018-02-28 – 2018-03-02 (×2)

## 2018-02-24 NOTE — Progress Notes (Signed)
   02/24/18 1200  Clinical Encounter Type  Visited With Patient  Visit Type Follow-up  Referral From Nurse  Consult/Referral To [place] was paged to visit the PT in relation to having an advanced directive completed.  The PT in conversation with the Chaplain seemed like he was leaning more to DNR status as opposed to having a need for an AD.  Chaplain informed the nurse of conversation with PT and nurse was to call the doctor to visit with PT for determination.   PT does not want to be a burden on anyone in his family.

## 2018-02-24 NOTE — Progress Notes (Signed)
ANTICOAGULATION CONSULT NOTE - [place] for warfarin Indication: LVAD  Allergies  Allergen Reactions  . Chlorhexidine Gluconate Itching and Rash  . Codeine Nausea And Vomiting and Other (See Comments)    In high doses  . Lipitor [Atorvastatin] Nausea Only and Other (See Comments)    Nausea with high doses, tolerates 20mg  dose (08/22/16)  . Tape Other (See Comments)    Paper tape is ok    Patient Measurements: Height: 5\' 9"  (175.3 cm) Weight: 222 lb 14.2 oz (101.1 kg) IBW/kg (Calculated) : 70.7  Vital Signs: Temp: 97.8 F (36.6 C) (05/24 0816) Temp Source: Oral (05/24 0816) BP: 92/71 (05/24 0816) Pulse Rate: 122 (05/24 0816)  Labs: Recent Labs    02/23/18 1022 02/24/18 0450  HGB 8.5* 8.1*  HCT 30.2* 27.9*  PLT 177 159  LABPROT 24.2* 27.7*  INR 2.19 2.61  CREATININE 1.64* 1.55*    Estimated Creatinine Clearance: 58.7 mL/min (A) (by C-G formula based on SCr of 1.55 mg/dL (H)).   Medical History: Past Medical History:  Diagnosis Date  . AICD (automatic cardioverter/defibrillator) present   . Anxiety   . ASCVD (arteriosclerotic cardiovascular disease)   . Chronic systolic CHF (congestive heart failure) ([place])   . COPD (chronic obstructive pulmonary disease) ([place])   . Coronary artery disease   . Depression   . GI bleed   . High cholesterol   . History of blood transfusion   . History of cocaine abuse   . Hypertension   . LVAD (left ventricular assist device) present ([place])   . Presence of permanent cardiac pacemaker   . Shortness of breath dyspnea   . Suicidal ideation   . Tobacco abuse   . Type II diabetes mellitus ([facility])      Assessment: 79 yoM s/p HM3 LVAD on warfarin PTA. INR therapeutic on admit at 2.19, dose held last night as pt reported taking medications already. INR slightly supratherapeutic today at 2.61, CBC low but stable.  PTA Dose = 5mg  Mon/Fri, 7.5mg  all other days  Goal of Therapy:  INR 1.8-2.4 Monitor  platelets by anticoagulation protocol: Yes   Plan:  -Warfarin 5mg  PO x1 tonight -Daily protime  Arrie Senate, PharmD, BCPS PGY-2 Cardiology Pharmacy Resident Pager: [phone] 02/24/2018

## 2018-02-24 NOTE — Progress Notes (Signed)
LVAD Coordinator Rounding Note:  Admitted 02/23/18 to Dr. Haroldine Laws due to ascites and CHF with fluid overload.   HM III LVAD implanted on 09/09/16 by Dr. Cyndia Bent under Destination Therapy criteria.  Pt sitting up in chair this am. Had paracentesis yesterday yielded 5.6 L off.  Vital signs: Temp:  97.6 HR: 63 Doppler Pressure: 70 Automatic BP: 88/57 (67) O2 Sat: 95 % on RA Wt: 222 lbs  LVAD interrogation reveals:  Speed: 5800 Flow: 5.3 Power: 4.4 w PI:  2.7 Alarms: none Events: rare PI events Hematocrit:  31 Fixed speed:  5800 Low speed limit: 5500  Primary Controller: Replace back up battery in23months. Back up controller: Replace back up battery in59months.  Drive Line: left abdominal sorbaview dressing dry and intact; anchor intact and accurately applied. Change weekly; due Thur May 30th. Driveline anchored and secured. Pt has an intolerance to CHG please use saline and betadine only.  Labs:  LDH trend: 156  INR trend: 2.61  Gtts: Dobutamine 7.5 mcg/kg/min Lasix 20 mg/hr  Anticoagulation Plan: -INR Goal:  1.8 - 2.3 -ASA Dose: no ASA (history of GI bleeding)  Device: -Protect; end of service 08/2016 -Therapies:  Off Last check: complete interrogation 05/26/2017. DDD (lower rate 60); BiV pacing with 96% AS-VP  Adverse Events on VAD: 11/30/16> Milrinone gtt at discharge 01/27/17>dobutamine at 5 mcg/kg/mingtt at discharge 03/08/17> RV failure 04/29/17> symptomatic anemia and melena. EGD revealed stomach ulcers. Carafate added; ASA stopped 05/31/17>Ongoing symptomatic anemia and melena; capsule study mild antral gastritis; poss tiny AVM right colon. Lower MAPS, Losartan cut back. 06/2017> admit with syncope, AMS, Dobutamine at 7.3mcg 11/09/17>Marked ascites, RV failure, and hepatic encephalopathy. Paracentesis on 2/7 and 2/12 with >11 liters fluid removed 12/27/17>Outpatient paracentesis with 2.8 liters removed 01/11/18>IP paracentesis with 4.8 liters  removed 01/16/18>IP paracentesis w/2.4L removed 02/15/18>OP paracentesis w/ 5 L removed 02/23/18>IP paracentesis w/ 5.6 L removed   Plan/Recommendations:  1. Will need weekly dressing changes; due next Thur May 30th. Bedside RN may change dressing w/above instructions. 2. Palliative Care consulted w/pt today. Pt made DNI/DNR w/exception of defib and meds. 3. Please call VAD pager with any VAD equipment or drive line issues.  Tanda Rockers RN, VAD Coordiantor 24/7 VAD pager: [phone]

## 2018-02-24 NOTE — Progress Notes (Addendum)
Advanced Heart Failure VAD Team Note  PCP-Cardiologist: Glori Bickers, MD   Subjective:    Asking for drinks all night. He says he was drinking lots of fluids at home. SOB with exertion. Weak. No orthopnea or PND.   Remains on dobutamine 7.5 mcg . Diuresing with lasix drip 15 mg per hour. CO-OX 46%  S/P Paracentesis 5/23 -->5.6 liters removed.   LVAD INTERROGATION:  HeartMate III LVAD:   Flow 5.4 liters/min, speed 5800, power 4, PI 2.2 Rare PI events.    Objective:    Vital Signs:   Temp:  [97.6 F (36.4 C)-98.5 F (36.9 C)] 97.6 F (36.4 C) (05/24 0402) Pulse Rate:  [61-108] 61 (05/24 0402) Resp:  [18-22] 18 (05/24 0402) BP: (66-111)/(0-88) 66/52 (05/24 0402) SpO2:  [92 %-98 %] 98 % (05/24 0402) Weight:  [220 lb 10.9 oz (100.1 kg)-236 lb (107 kg)] 222 lb 14.2 oz (101.1 kg) (05/24 0402)   Mean arterial Pressure  Intake/Output:   Intake/Output Summary (Last 24 hours) at 02/24/2018 0718 Last data filed at 02/24/2018 0551 Gross per 24 hour  Intake 600.95 ml  Output 1900 ml  Net -1299.05 ml     Physical Exam    CVP 15-16 General:  Chronically ill and weak appearing No resp difficulty HEENT: normal anicteric  Neck: supple. JVP to jaw  . Carotids 2+ bilat; no bruits. No lymphadenopathy or thyromegaly appreciated. Cor: Mechanical heart sounds with LVAD hum present. R upper chest tunneled catheter.  Lungs: clear no wheeze Abdomen: obese soft, nontender, ++ distended. No hepatosplenomegaly. No bruits or masses. Good bowel sounds. Driveline: C/D/I; securement device intact and driveline incorporated Extremities: no cyanosis, clubbing, rash, R and LLE 2-3+ edema Neuro: alert & oriented x 3, cranial nerves grossly intact. moves all 4 extremities w/o difficulty. Affect flat   Telemetry    A fib 60s Personally reviewed  EKG    n/a  Labs   Basic Metabolic Panel: Recent Labs  Lab 02/23/18 1022 02/24/18 0450  NA 132* 130*  K 4.5 4.5  CL 93* 90*  CO2 31 29    GLUCOSE 186* 176*  BUN 32* 30*  CREATININE 1.64* 1.55*  CALCIUM 8.5* 8.0*  MG  --  2.1    Liver Function Tests: Recent Labs  Lab 02/23/18 1022  AST 25  ALT 17  ALKPHOS 94  BILITOT 1.0  PROT 6.7  ALBUMIN 3.0*   No results for input(s): LIPASE, AMYLASE in the last 168 hours. Recent Labs  Lab 02/23/18 1022  AMMONIA 30    CBC: Recent Labs  Lab 02/23/18 1022 02/24/18 0450  WBC 7.6 7.4  HGB 8.5* 8.1*  HCT 30.2* 27.9*  MCV 78.9 78.2  PLT 177 159    INR: Recent Labs  Lab 02/23/18 1022 02/24/18 0450  INR 2.19 2.61    Other results:  EKG:    Imaging   Dg Chest Port 1 View  Result Date: 02/23/2018 CLINICAL DATA:  Shortness of breath. EXAM: PORTABLE CHEST 1 VIEW COMPARISON:  Chest x-ray dated November 09, 2017. FINDINGS: Unchanged left chest wall AICD. Stable total right internal jugular central venous catheter. Unchanged LVAD device. Stable cardiomegaly status post CABG. Normal pulmonary vascularity. Bibasilar atelectasis. No focal consolidation, pleural effusion, or pneumothorax. No acute osseous abnormality. IMPRESSION: Stable chest.  No active disease. Electronically Signed   By: Titus Dubin M.D.   On: 02/23/2018 16:45   Ir Paracentesis  Result Date: 02/23/2018 INDICATION: Congestive heart failure. LVAD. Recurrent ascites. Request for therapeutic paracentesis.  EXAM: ULTRASOUND GUIDED PARACENTESIS MEDICATIONS: 1% Lidocaine = 10 mL COMPLICATIONS: None immediate. PROCEDURE: Informed written consent was obtained from the patient after a discussion of the risks, benefits and alternatives to treatment. A timeout was performed prior to the initiation of the procedure. Initial ultrasound scanning demonstrates a large amount of ascites within the right lower abdominal quadrant. The right lower abdomen was prepped and draped in the usual sterile fashion. 1% lidocaine with epinephrine was used for local anesthesia. Following this, a 19 gauge, 7-cm, Yueh catheter was  introduced. An ultrasound image was saved for documentation purposes. The paracentesis was performed. The catheter was removed and a dressing was applied. The patient tolerated the procedure well without immediate post procedural complication. FINDINGS: A total of approximately 5.6 liters of clear yellow fluid was removed. IMPRESSION: Successful ultrasound-guided paracentesis yielding 5.6 liters of peritoneal fluid. Read by: Gareth Eagle, PA-C Electronically Signed   By: [name] M.D.   On: 02/23/2018 16:44      Medications:     Scheduled Medications: . amiodarone  200 mg Oral Daily  . citalopram  20 mg Oral Daily  . digoxin  0.125 mg Oral Daily  . gabapentin  300 mg Oral BID  . insulin aspart  0-9 Units Subcutaneous TID WC  . lactulose  45 g Oral TID  . magnesium oxide  400 mg Oral Daily  . midodrine  10 mg Oral TID WC  . multivitamin with minerals  1 tablet Oral Daily  . pantoprazole  40 mg Oral Daily  . potassium chloride SA  40 mEq Oral BID  . spironolactone  25 mg Oral Daily  . sucralfate  1 g Oral TID WC & HS     Infusions: . DOBUTamine 7.5 mcg/kg/min (02/23/18 1900)  . furosemide (LASIX) infusion 15 mg/hr (02/23/18 1900)     PRN Medications:  acetaminophen, cyclobenzaprine, docusate sodium, ondansetron, senna-docusate, traZODone   Patient Profile  Ronald Milleris a 62 y.o.male with history of chronic systolic HF s/p Medtronic ICD 2014, CAD s/p CABG in 2000, HTN, Hx of cocaine abuse, Tobacco abuse, Depression, PAF, and COPD. HM3 placed 09/09/2016. In April 2018 he was placed on milrinone but later switched to dobutamine. He remains on dobutamine 7.73mcg  Admitting today with marked volume overload.    Assessment/Plan:     1.Acute/Chronic end-stage biventricular systolic HA:LPFX 90% due to ICM ->s/p Echo 08/24/16 LVEF 15%, RV mild dilated, moderately reduced. -->S/P HM3 LVAD 09/09/2016. Has been turned down for [facility] at [facility].  Marked volume  overload. He was discharged the end of April. Weight was 189 pounds,  - Remains on dobutamine 7.5 mcg and midodrine. CO-OX 46%. No room to titrate dobutamine.  - Continue midodrine 10 TID for low MAPs and cirrhotic physiology. - Remains volume overloaded. Increase lasix drip to 20 mg per hour and add 2.5 mg metolazone daily. .  - Off sildenafil due to hypotension - No beta blocker with RV failure.  - VAD interrogated personally. Parameters stable. - Warfarin with goal INR 2.0 - 2.5 + ASA 81 daily.INR 2.6 Discussed with pharmacy.  -LDH stable 156 - VAD interrogated personally. Parameters stable. - Not transplant candidate due to social issues, noncompliance and cirrhosis (has been turned down by [facility]) 2. CAD: Severe 3v- CAD s/p CABG with occluded grafts as above except for LIMA.  -No s/s ischemia. Continue current therapy.  3. DMII:  - Continue insulin regimen. Start sliding scale.  4. Tobacco abuse:  - Reports complete cessation  5. Atrial fibrillation, paroxysmal - Interrogate device.  - Continue amio 200 daily. TSH in am  Careful with cirrhosis  6. Anxiety/Depression - Continue Celexa 20mg  daily . - Palliative Care consult.  7. Cirrhosis with hepatic encephalopathy - likely combination ETOH and chronic RV failure -No asterictixis. -Ammonia level 30 on admit. Continue lactulose at 45 tid -S/P Paracentesis 5/23 with 5.6 liters removed.  8. Anemia, iron-deficiency:  - Has had GI work up with EGD and Capsule Endoscopy showing severe gastropath No evidence of active bleeding. Continue carafate. Hgb 8.1. Daily CBC 9. GOC: Palliative Care consulted   Will need to go back to SNF.   I reviewed the LVAD parameters from today, and compared the results to the patient's prior recorded data.  No programming changes were made.  The LVAD is functioning within specified parameters.  The patient performs LVAD self-test daily.  LVAD interrogation was negative for any significant power  changes, alarms or PI events/speed drops.  LVAD equipment check completed and is in good working order.  Back-up equipment present.   LVAD education done on emergency procedures and precautions and reviewed exit site care.  Length of Stay: 1  Amy Clegg, NP 02/24/2018, 7:18 AM  VAD Team --- VAD ISSUES ONLY--- Pager [phone] (7am - 7am)  Advanced Heart Failure Team  Pager [phone] (M-F; 7a - 4p)  Please contact [facility] for night-coverage after hours (4p -7a ) and weekends on [email or web address]  Patient seen and examined with Darrick Grinder, NP. We discussed all aspects of the encounter. I agree with the assessment and plan as stated above.   Admitted with recurrent, severe volume overload. S/p paracentesis yesterday. Urine output sluggish despite lasix gtt at 15. CVP 15-16. Will increase lasix to 20/hr. Continue dobutamine for RV support and midodrine for BP support. He has no insight into his disease and no ability for self care. Will need long-term residence at [place] at d/c. He is DNR/DNI.   Glori Bickers, MD  8:05 AM

## 2018-02-24 NOTE — Consult Note (Signed)
Consultation Note Date: 02/24/2018   Patient Name: Ronald Miller.  DOB: 1956/05/02  MRN: [id number]  Age / Sex: 62 y.o., male  PCP: Leone Haven, MD Referring Physician: Jolaine Artist, MD  Reason for Consultation: Establishing goals of care and Psychosocial/spiritual support  HPI/Patient Profile: 62 y.o. male   admitted on 02/23/2018 with history of chronic systolic HF s/p Medtronic ICD 2014, HM3 placed 09/09/2016. In April 2018 he was placed on milrinone but later switched to dobutamine. He remains on dobutamine 7.5 mcg.  CAD s/p CABG in 2000, HTN, Hx of cocaine abuse, Tobacco abuse, Depression, PAF, and COPD.   Admitted 2/6-2/18/2019 with recurrent volume overload, RHF and ascites despite dobutamine support. Underwent paracentesis on 2/7 and 2/12. He had over 11 liters removed from paracentesis.   Diuresed with IV lasix and transitioned back to home torsemide 100 mg/50mg  daily. Overall he lost 47 pounds. Also treated for hepatic encephalopathy. MAPs were soft with diuresis so midodrine increased to 10 tid. Weight on d/c 195 pounds.   Admitted from clinic4/9/19-4/26/19with encephalopathy and acute on chronic CHF.Pt had somehow disconnected his dobutamine. The line was "fractured" in 2 places. Weight up 30+ pounds from baseline. Dobutamine restarted and patient diuresed with lasix gtt and metolazone. Diamox added. Pt required Paracentesis 4/10 AND 01/16/18 with 4.8L and 2.4 L off, respectively.   Admitted after being seen in the  [facility]. Recently  discharged from [facility].  Marked abdominal bloating. Weight up nearly 40 pounds.   Non compliance with fluid intake.  He lives with his sister.  His sister has been trying to provide healthy meals and medication administration, difficult situation.   He has been followed by [facility] for home dobutamine.  Patient and family face the  physical, emotional and financial struggles of living with serious life limiting illness,   treatment option decisions, advanced directive decisions and anticipatory care needs.   Clinical Assessment and Goals of Care:   This NP Wadie Lessen reviewed medical records, received report from team, assessed the patient and then meet at the patient's bedside along with his sister/Terri  to discuss current medical situation, prognosis, GOC, EOL wishes disposition and options.  Concept of Hospice and Palliative Care were discussed  A detailed discussion was had today regarding advanced directives.  Concepts specific to code status, artifical feeding and hydration, continued IV antibiotics and rehospitalization was had.    We discussed the reality that life expectancy is limited 2/2 to his serious medical conditions and the limitations of medical interventions.  There is an end point to VAD therapy.    Values and goals of care important to patient and family were attempted to be elicited.    Questions and concerns addressed.   Family encouraged to call with questions or concerns.   PMT will continue to support holistically.   Sister is HPOA/ Iantha Fallen.  There is  a clearly documented advanced directive in [place] Planning/Scope of treatment  Limited code           - No CPR, no intubation *    No artificial feeding now or in the future *    No consideration for dialysis now or in the future *    PT/OT treat and evaluate    Psycho-social/Spiritual:   Desire for further Chaplaincy support:yes  Additional Recommendations: Education on Hospice  Prognosis:   Unable to determine  Discharge Planning: Patient is open to long term care placement at [facility].  He and his family verbalize understanding that this is the best and safest option, with goal being prolonged life.     Primary Diagnoses: Present on Admission: .  Acute on chronic systolic heart failure ([place])   I have reviewed the medical record, interviewed the patient and family, and examined the patient. The following aspects are pertinent.  Past Medical History:  Diagnosis Date  . AICD (automatic cardioverter/defibrillator) present   . Anxiety   . ASCVD (arteriosclerotic cardiovascular disease)   . Chronic systolic CHF (congestive heart failure) ([place])   . COPD (chronic obstructive pulmonary disease) ([place])   . Coronary artery disease   . Depression   . GI bleed   . High cholesterol   . History of blood transfusion   . History of cocaine abuse   . Hypertension   . LVAD (left ventricular assist device) present ([place])   . Presence of permanent cardiac pacemaker   . Shortness of breath dyspnea   . Suicidal ideation   . Tobacco abuse   . Type II diabetes mellitus ([place])    Social History   Socioeconomic History  . Marital status: Divorced    Spouse name: Not on file  . Number of children: 1  . Years of education: 12  . Highest education level: Not on file  Occupational History  . Occupation: Retired  . Occupation: [employer] operational officer  . Financial resource strain: Not on file  . Food insecurity:    Worry: Not on file    Inability: Not on file  . Transportation needs:    Medical: Not on file    Non-medical: Not on file  Tobacco Use  . Smoking status: Former Smoker    Packs/day: 1.50    Years: 23.00    Pack years: 34.50    Types: Cigarettes    Start date: 10/24/1992    Last attempt to quit: 07/01/2016    Years since quitting: 1.6  . Smokeless tobacco: Never Used  Substance and Sexual Activity  . Alcohol use: No    Alcohol/week: 0.0 oz  . Drug use: Yes    Frequency: 0.1 times per week    Types: Cocaine    Comment: 07/01/2016 last use relapse after 8 yrs  . Sexual activity: Not Currently  Lifestyle  . Physical activity:    Days per week: Not on file    Minutes per session: Not on file  . Stress: Not on file    Relationships  . Social connections:    Talks on phone: Not on file    Gets together: Not on file    Attends religious service: Not on file    Active member of club or organization: Not on file    Attends meetings of clubs or organizations: Not on file    Relationship status: Not on file  Other Topics Concern  . Not on file  Social History Narrative   Divorced with one child   No regular exercise  Family History  Problem Relation Age of Onset  . Arthritis Mother   . Hyperlipidemia Mother   . CAD Father   . Diabetes Father   . Heart failure Father   . Alcohol abuse Father   . Hyperlipidemia Father   . Hyperlipidemia Maternal Grandmother   . Hyperlipidemia Maternal Grandfather   . Hyperlipidemia Paternal Grandmother   . Hyperlipidemia Paternal Grandfather    Scheduled Meds: . amiodarone  200 mg Oral Daily  . citalopram  20 mg Oral Daily  . digoxin  0.125 mg Oral Daily  . gabapentin  300 mg Oral BID  . insulin aspart  0-9 Units Subcutaneous TID WC  . lactulose  45 g Oral TID  . magnesium oxide  400 mg Oral Daily  . metolazone  2.5 mg Oral Daily  . midodrine  10 mg Oral TID WC  . multivitamin with minerals  1 tablet Oral Daily  . pantoprazole  40 mg Oral Daily  . potassium chloride SA  40 mEq Oral BID  . spironolactone  25 mg Oral Daily  . sucralfate  1 g Oral TID WC & HS   Continuous Infusions: . DOBUTamine 7.5 mcg/kg/min (02/23/18 1900)  . furosemide (LASIX) infusion 20 mg/hr (02/24/18 0839)   PRN Meds:.acetaminophen, cyclobenzaprine, docusate sodium, ondansetron, senna-docusate, traZODone Medications Prior to Admission:  Prior to Admission medications   Medication Sig Start Date End Date Taking? Authorizing Provider  acetaminophen (TYLENOL) 500 MG tablet Take 1,000 mg by mouth every 6 (six) hours as needed for moderate pain or headache.   Yes [provider]  amiodarone (PACERONE) 200 MG tablet TAKE ONE TABLET BY MOUTH ONCE DAILY 10/31/17  Yes Bensimhon,  Shaune Pascal, MD  citalopram (CELEXA) 20 MG tablet Take 1 tablet (20 mg total) by mouth daily. 05/09/17  Yes Clegg, Amy D, NP  cyclobenzaprine (FLEXERIL) 10 MG tablet Take 1 tablet (10 mg total) by mouth 3 (three) times daily as needed for muscle spasms. 11/30/17  Yes Larey Dresser, MD  digoxin (DIGOX) 0.125 MG tablet Take 1 tablet (0.125 mg total) daily by mouth. 08/18/17  Yes Bensimhon, Shaune Pascal, MD  DOBUTamine (DOBUTREX) 4-5 MG/ML-% infusion Inject 714.75 mcg/min into the vein continuous. PER [facility] pharmacy 11/21/17  Yes Clegg, Amy D, NP  docusate sodium (COLACE) 100 MG capsule Take 200 mg by mouth at bedtime as needed for mild constipation.    Yes [provider]  gabapentin (NEURONTIN) 300 MG capsule Take 1 capsule (300 mg total) by mouth 2 (two) times daily. 09/30/17  Yes Bensimhon, Shaune Pascal, MD  insulin aspart (NOVOLOG FLEXPEN) 100 UNIT/ML FlexPen Inject 5 Units into the skin 3 (three) times daily with meals. 05/09/17  Yes Clegg, Amy D, NP  Insulin Glargine (LANTUS SOLOSTAR) 100 UNIT/ML Solostar Pen Inject 10 Units into the skin daily at 10 pm. 11/21/17  Yes Clegg, Amy D, NP  lactulose (CHRONULAC) 10 GM/15ML solution Take 67.5 mLs (45 g total) by mouth 3 (three) times daily. 01/27/18  Yes Clegg, Amy D, NP  magnesium oxide (MAG-OX) 400 (241.3 Mg) MG tablet Take 1 tablet (400 mg total) by mouth daily. 05/09/17  Yes Clegg, Amy D, NP  metolazone (ZAROXOLYN) 2.5 MG tablet Take 2.5 mg by mouth See admin instructions. Take one tablet (2.5mg ) by mouth on Fridays and Mondays.   Yes [provider]  midodrine (PROAMATINE) 10 MG tablet Take 1 tablet (10 mg total) by mouth 3 (three) times daily with meals. 11/21/17  Yes Clegg, Amy D,  NP  Multiple Vitamin (MULTIVITAMIN WITH MINERALS) TABS tablet Take 1 tablet by mouth daily.   Yes [provider]  ondansetron (ZOFRAN ODT) 4 MG disintegrating tablet Take 1 tablet (4 mg total) by mouth every 8 (eight) hours as needed for nausea. 04/10/17  Yes Tanna Furry, MD  pantoprazole (PROTONIX) 40 MG tablet TAKE 1 TABLET BY MOUTH TWICE DAILY WITH A MEAL 01/05/18  Yes Bensimhon, Shaune Pascal, MD  potassium chloride SA (K-DUR,KLOR-CON) 20 MEQ tablet Take 2 tablets (40 mEq total) by mouth 2 (two) times daily. Take 40 meq in am and 20 meq in pm 12/30/17  Yes Clegg, Amy D, NP  senna-docusate (SENOKOT S) 8.6-50 MG tablet Take 2 tablets by mouth at bedtime as needed for mild constipation. 05/09/17  Yes Clegg, Amy D, NP  spironolactone (ALDACTONE) 25 MG tablet Take 1 tablet (25 mg total) by mouth daily. 01/27/18  Yes Clegg, Amy D, NP  sucralfate (CARAFATE) 1 GM/10ML suspension Take 10 mLs (1 g total) by mouth 4 (four) times daily -  with meals and at bedtime. 05/09/17  Yes Clegg, Amy D, NP  torsemide (DEMADEX) 100 MG tablet Take 1 tablet (100 mg total) by mouth 2 (two) times daily. Take 100mg  in the morning and 50mg  in the evening 01/27/18  Yes Clegg, Amy D, NP  traZODone (DESYREL) 50 MG tablet Take 1 tablet (50 mg total) by mouth at bedtime as needed for sleep. 12/20/17  Yes [name] Friar, PA-C  warfarin (COUMADIN) 5 MG tablet Take 5-7.5 mg by mouth See admin instructions. Take 1 and 1/2 tablets (7.5 mg) daily except 1 tablet (5 mg) on Monday, Wednesday and Friday    Yes [provider]   Allergies  Allergen Reactions  . Chlorhexidine Gluconate Itching and Rash  . Codeine Nausea And Vomiting and Other (See Comments)    In high doses  . Lipitor [Atorvastatin] Nausea Only and Other (See Comments)    Nausea with high doses, tolerates 20mg  dose (08/22/16)  . Tape Other (See Comments)    Paper tape is ok   Review of Systems  Constitutional: Positive for fatigue and unexpected weight change.  Neurological: Positive for weakness.    Physical Exam  Constitutional: He is oriented to person, place, and time. He appears well-developed.  Cardiovascular:  - noted LVAD hum - BLE +3 edema  Neurological: He is alert and oriented to person, place, and time.    Skin: Skin is warm and dry.  Psychiatric:  Poor insight into the complexity of his current medical situation    Vital Signs: BP 92/71 (BP Location: Left Arm)   Pulse (!) 122   Temp 97.8 F (36.6 C) (Oral)   Resp (!) 23   Ht 5\' 9"  (1.753 m)   Wt 101.1 kg (222 lb 14.2 oz)   SpO2 99%   BMI 32.91 kg/m  Pain Scale: 0-10 POSS *See Group Information*: 1-Acceptable,Awake and alert Pain Score: 0-No pain   SpO2: SpO2: 99 % O2 Device:SpO2: 99 % O2 Flow Rate: .   IO: Intake/output summary:   Intake/Output Summary (Last 24 hours) at 02/24/2018 0909 Last data filed at 02/24/2018 7517 Gross per 24 hour  Intake 818.92 ml  Output 2250 ml  Net -1431.08 ml    LBM: Last BM Date: 02/21/18 Baseline Weight: Weight: 100.1 kg (220 lb 10.9 oz) Most recent weight: Weight: 101.1 kg (222 lb 14.2 oz)     Palliative Assessment/Data: 50%   Discussed with Darrick Grinder NP and  Tressia Danas with LVAD team  Time In: 1015 Time Out: 1130 Time Total: 75 minutes Greater than 50%  of this time was spent counseling and coordinating care related to the above assessment and plan.  Signed by: Wadie Lessen, NP   Please contact Palliative Medicine Team phone at [phone] for questions and concerns.  For individual provider: See Shea Evans

## 2018-02-25 ENCOUNTER — Encounter (HOSPITAL_COMMUNITY): Payer: Self-pay | Admitting: *Deleted

## 2018-02-25 LAB — BASIC METABOLIC PANEL
Anion gap: 9 (ref 5–15)
BUN: 26 mg/dL — AB (ref 6–20)
CALCIUM: 8.2 mg/dL — AB (ref 8.9–10.3)
CO2: 31 mmol/L (ref 22–32)
Chloride: 90 mmol/L — ABNORMAL LOW (ref 101–111)
Creatinine, Ser: 1.28 mg/dL — ABNORMAL HIGH (ref 0.61–1.24)
GFR calc Af Amer: >60 mL/min (ref >60)
GFR, EST NON AFRICAN AMERICAN: 59 mL/min — AB (ref >60)
Glucose, Bld: 156 mg/dL — ABNORMAL HIGH (ref 65–99)
POTASSIUM: 3.7 mmol/L (ref 3.5–5.1)
SODIUM: 130 mmol/L — AB (ref 135–145)

## 2018-02-25 LAB — GLUCOSE, CAPILLARY
GLUCOSE-CAPILLARY: 147 mg/dL — AB (ref 65–99)
GLUCOSE-CAPILLARY: 178 mg/dL — AB (ref 65–99)
Glucose-Capillary: 249 mg/dL — ABNORMAL HIGH (ref 65–99)
Glucose-Capillary: 131 mg/dL — ABNORMAL HIGH (ref 65–99)

## 2018-02-25 LAB — COOXEMETRY PANEL
CARBOXYHEMOGLOBIN: 1.8 % — AB (ref 0.5–1.5)
Carboxyhemoglobin: 2.4 % — ABNORMAL HIGH (ref 0.5–1.5)
METHEMOGLOBIN: 1.6 % — AB (ref 0.0–1.5)
Methemoglobin: 0.9 % (ref 0.0–1.5)
O2 SAT: 53.9 %
O2 SAT: 70.1 %
TOTAL HEMOGLOBIN: 8.3 g/dL — AB (ref 12.0–16.0)
Total hemoglobin: 8.4 g/dL — ABNORMAL LOW (ref 12.0–16.0)

## 2018-02-25 LAB — CBC
HCT: 27.9 % — ABNORMAL LOW (ref 39.0–52.0)
Hemoglobin: 8.1 g/dL — ABNORMAL LOW (ref 13.0–17.0)
MCH: 22.2 pg — AB (ref 26.0–34.0)
MCHC: 29 g/dL — ABNORMAL LOW (ref 30.0–36.0)
MCV: 76.4 fL — ABNORMAL LOW (ref 78.0–100.0)
PLATELETS: 148 10*3/uL — AB (ref 150–400)
RBC: 3.65 MIL/uL — AB (ref 4.22–5.81)
RDW: 18 % — AB (ref 11.5–15.5)
WBC: 6.5 10*3/uL (ref 4.0–10.5)

## 2018-02-25 LAB — MAGNESIUM: MAGNESIUM: 1.9 mg/dL (ref 1.7–2.4)

## 2018-02-25 LAB — TSH: TSH: 15.738 u[IU]/mL — ABNORMAL HIGH (ref 0.350–4.500)

## 2018-02-25 LAB — PROTIME-INR
INR: 2.47
PROTHROMBIN TIME: 26.6 s — AB (ref 11.4–15.2)

## 2018-02-25 LAB — LACTATE DEHYDROGENASE: LDH: 155 U/L (ref 98–192)

## 2018-02-25 MED ORDER — POTASSIUM CHLORIDE CRYS ER 20 MEQ PO TBCR
40.0000 meq | EXTENDED_RELEASE_TABLET | Freq: Once | ORAL | Status: AC
  Administered 2018-02-25: 40 meq via ORAL
  Filled 2018-02-25: qty 2

## 2018-02-25 MED ORDER — WARFARIN SODIUM 7.5 MG PO TABS
7.5000 mg | ORAL_TABLET | Freq: Once | ORAL | Status: AC
  Administered 2018-02-25: 7.5 mg via ORAL
  Filled 2018-02-25: qty 1

## 2018-02-25 NOTE — Evaluation (Signed)
Physical Therapy Evaluation Patient Details Name: Ronald Miller. MRN: [id number] DOB: 03/01/1956 Today's Date: 02/25/2018   History of Present Illness  Pt is a 62 y.o. male with PMH including LVAD implant (09/2016), CHF, CAD, HTN, depression, COPD, admitted 02/23/18 with marked abdominal bloating; worked up for acute on chronic end-stage biventricular systolic HF. Of note, multiple recent admission with recent d/c home from [facility] SNF.    Clinical Impression  Pt presents with an overall decrease in functional mobility secondary to above. PTA, pt recently d/c from SNF and living in apartment with assist from sister. Today, pt limited by fatigue; only agreeable to stand and perform pre-gait activity with LVAD remaining on wall power. Demonstrates generalized weakness and decreased activity tolerance even with minimal activity. Pt would benefit from continued acute PT services to maximize functional mobility and independence prior to d/c with SNF-level therapies.     Follow Up Recommendations SNF;Supervision/Assistance - 24 hour    Equipment Recommendations  None recommended by PT    Recommendations for Other Services       Precautions / Restrictions Precautions Precautions: Fall Precaution Comments: LVAD Restrictions Weight Bearing Restrictions: No      Mobility  Bed Mobility               General bed mobility comments: Seated in recliner upon arrival  Transfers Overall transfer level: Needs assistance Equipment used: Rolling walker (2 wheeled) Transfers: Sit to/from Stand Sit to Stand: Min guard         General transfer comment: Cues for safety with equipment  Ambulation/Gait Ambulation/Gait assistance: Min guard   Assistive device: Rolling walker (2 wheeled)       General Gait Details: Pt declining; willing to march in place with BUE support on RW; DOE 2/4 with this and pt easily fatigued  Financial trader  [place] (Stroke Patients Only)       Balance Overall balance assessment: Needs assistance   Sitting balance-Leahy Scale: Fair Sitting balance - Comments: fatigues quickly   Standing balance support: Single extremity supported Standing balance-Leahy Scale: Poor                               Pertinent Vitals/Pain Pain Assessment: No/denies pain    Home Living Family/patient expects to be discharged to:: Skilled nursing facility Living Arrangements: Alone Available Help at Discharge: Family;Available PRN/intermittently Type of Home: Apartment                Prior Function Level of Independence: Independent with assistive device(s)         Comments: Pt becoming frustrated with questions regarding PLOF; reports "no issue" getting around apartment. Per chart, recent d/c from [facility] 02/18/18; sister provides regular assistance     Hand Dominance        Extremity/Trunk Assessment   Upper Extremity Assessment Upper Extremity Assessment: Generalized weakness    Lower Extremity Assessment Lower Extremity Assessment: Generalized weakness    Cervical / Trunk Assessment Cervical / Trunk Assessment: Kyphotic  Communication   Communication: No difficulties  Cognition Arousal/Alertness: Awake/alert Behavior During Therapy: Flat affect Overall Cognitive Status: No family/caregiver present to determine baseline cognitive functioning                                 General  Comments: Pt becoming very frustated when asked to ambulate with PT, flat affect otherwise; difficult to reason with.       General Comments      Exercises     Assessment/Plan    PT Assessment Patient needs continued PT services  PT Problem List Decreased strength;Decreased mobility;Decreased activity tolerance;Decreased balance;Decreased knowledge of use of DME;Decreased cognition       PT Treatment Interventions Gait training;Therapeutic exercise;Patient/family  education;Stair training;Functional mobility training;Balance training;DME instruction;Therapeutic activities;Cognitive remediation    PT Goals (Current goals can be found in the Care Plan section)  Acute Rehab PT Goals Patient Stated Goal: Get longer term rehab PT Goal Formulation: With patient Time For Goal Achievement: 03/11/18 Potential to Achieve Goals: Good    Frequency Min 2X/week   Barriers to discharge Decreased caregiver support      Co-evaluation               AM-PAC PT "6 Clicks" Daily Activity  Outcome Measure Difficulty turning over in bed (including adjusting bedclothes, sheets and blankets)?: Unable Difficulty moving from lying on back to sitting on the side of the bed? : Unable Difficulty sitting down on and standing up from a chair with arms (e.g., wheelchair, bedside commode, etc,.)?: A Little Help needed moving to and from a bed to chair (including a wheelchair)?: A Little Help needed walking in hospital room?: A Little Help needed climbing 3-5 steps with a railing? : A Little 6 Click Score: 14    End of Session Equipment Utilized During Treatment: Gait belt Activity Tolerance: Patient limited by fatigue Patient left: in chair;with call bell/phone within reach Nurse Communication: Mobility status PT Visit Diagnosis: Other abnormalities of gait and mobility (R26.89);Muscle weakness (generalized) (M62.81)    Time: 6568-1275 PT Time Calculation (min) (ACUTE ONLY): 9 min   Charges:   PT Evaluation $PT Eval Moderate Complexity: 1 Mod     PT G Codes:       Mabeline Caras, PT, DPT Acute Rehab Services  Pager: [phone] 02/25/2018, 12:24 PM

## 2018-02-25 NOTE — Progress Notes (Signed)
ANTICOAGULATION CONSULT NOTE - [place] for warfarin Indication: LVAD  Allergies  Allergen Reactions  . Chlorhexidine Gluconate Itching and Rash  . Codeine Nausea And Vomiting and Other (See Comments)    In high doses  . Lipitor [Atorvastatin] Nausea Only and Other (See Comments)    Nausea with high doses, tolerates 20mg  dose (08/22/16)  . Tape Other (See Comments)    Paper tape is ok    Patient Measurements: Height: 5\' 9"  (175.3 cm) Weight: 214 lb (97.1 kg) IBW/kg (Calculated) : 70.7  Vital Signs: Temp: 98.3 F (36.8 C) (05/25 0330) Temp Source: Oral (05/25 0330) BP: 87/72 (05/25 0330) Pulse Rate: 70 (05/25 0330)  Labs: Recent Labs    02/23/18 1022 02/24/18 0450 02/25/18 0500  HGB 8.5* 8.1* 8.1*  HCT 30.2* 27.9* 27.9*  PLT 177 159 148*  LABPROT 24.2* 27.7* 26.6*  INR 2.19 2.61 2.47  CREATININE 1.64* 1.55* 1.28*    Estimated Creatinine Clearance: 69.7 mL/min (A) (by C-G formula based on SCr of 1.28 mg/dL (H)).   Medical History: Past Medical History:  Diagnosis Date  . AICD (automatic cardioverter/defibrillator) present   . Anxiety   . ASCVD (arteriosclerotic cardiovascular disease)   . Chronic systolic CHF (congestive heart failure) ([place])   . COPD (chronic obstructive pulmonary disease) ([place])   . Coronary artery disease   . Depression   . GI bleed   . High cholesterol   . History of blood transfusion   . History of cocaine abuse   . Hypertension   . LVAD (left ventricular assist device) present ([place])   . Presence of permanent cardiac pacemaker   . Shortness of breath dyspnea   . Suicidal ideation   . Tobacco abuse   . Type II diabetes mellitus (HCC)      Assessment: 66 yoM s/p HM3 LVAD on warfarin PTA. INR therapeutic on admit at 2.19, dose held last night as pt reported taking medications already. INR slightly supratherapeutic today at 2.47, CBC at baseline.  PTA Dose = 5mg  Mon/Fri, 7.5mg  all other days  Goal of  Therapy:  INR 1.8-2.4 Monitor platelets by anticoagulation protocol: Yes   Plan:  -Warfarin 7.5mg  PO x1 tonight -Daily protime  Arrie Senate, PharmD, BCPS PGY-2 Cardiology Pharmacy Resident Pager: [phone] 02/25/2018

## 2018-02-25 NOTE — Progress Notes (Signed)
Advanced Heart Failure VAD Team Note  PCP-Cardiologist: Glori Bickers, MD   Subjective:    Remains on dobutamine 7.5 mcg/kg/min. Lasix gtt increased to 20 yesterday. Diuresis improved. Weight down another 8 pounds. Still drinking ice. Breathing better. Feels less bloated. Co-ox 54%. Creatinine improving. CVp 15  S/P Paracentesis 5/23 -->5.6 liters removed.   LVAD INTERROGATION:  HeartMate III LVAD:   Flow 5.5 liters/min, speed 5800, power 5.0, PI 2.4 Rare PI events.    Objective:    Vital Signs:   Temp:  [97.7 F (36.5 C)-98.7 F (37.1 C)] 97.8 F (36.6 C) (05/25 1131) Pulse Rate:  [63-72] 69 (05/25 1131) Resp:  [15-25] 20 (05/25 1131) BP: (73-124)/(43-94) 87/73 (05/25 1131) SpO2:  [93 %-99 %] 94 % (05/25 1131) Weight:  [97.1 kg (214 lb)] 97.1 kg (214 lb) (05/25 0330) Last BM Date: 02/24/18 Mean arterial Pressure 70s Intake/Output:   Intake/Output Summary (Last 24 hours) at 02/25/2018 1224 Last data filed at 02/25/2018 0900 Gross per 24 hour  Intake 1155 ml  Output 5081 ml  Net -3926 ml     Physical Exam    CVP 15 General:  Sitting in chair NAD. More alert HEENT: normal  Neck: supple. JVP ear Carotids 2+ bilat; no bruits. No lymphadenopathy or thryomegaly appreciated. Cor: LVAD hum.  Lungs: Clear. Abdomen: obese soft, nontender, + distended. No hepatosplenomegaly. No bruits or masses. Good bowel sounds. Driveline site clean. Anchor in place.  Extremities: no cyanosis, clubbing, rash. 2+  edema  Neuro: alert & oriented x 3. No focal deficits. Moves all 4 without problem No asterixis    Telemetry    A fib 60-70s with v-pacing  Personally reviewed  EKG    n/a  Labs   Basic Metabolic Panel: Recent Labs  Lab 02/23/18 1022 02/24/18 0450 02/25/18 0500  NA 132* 130* 130*  K 4.5 4.5 3.7  CL 93* 90* 90*  CO2 31 29 31   GLUCOSE 186* 176* 156*  BUN 32* 30* 26*  CREATININE 1.64* 1.55* 1.28*  CALCIUM 8.5* 8.0* 8.2*  MG  --  2.1 1.9    Liver Function  Tests: Recent Labs  Lab 02/23/18 1022  AST 25  ALT 17  ALKPHOS 94  BILITOT 1.0  PROT 6.7  ALBUMIN 3.0*   No results for input(s): LIPASE, AMYLASE in the last 168 hours. Recent Labs  Lab 02/23/18 1022  AMMONIA 30    CBC: Recent Labs  Lab 02/23/18 1022 02/24/18 0450 02/25/18 0500  WBC 7.6 7.4 6.5  HGB 8.5* 8.1* 8.1*  HCT 30.2* 27.9* 27.9*  MCV 78.9 78.2 76.4*  PLT 177 159 148*    INR: Recent Labs  Lab 02/23/18 1022 02/24/18 0450 02/25/18 0500  INR 2.19 2.61 2.47    Other results:     Imaging   Dg Chest Port 1 View  Result Date: 02/23/2018 CLINICAL DATA:  Shortness of breath. EXAM: PORTABLE CHEST 1 VIEW COMPARISON:  Chest x-ray dated November 09, 2017. FINDINGS: Unchanged left chest wall AICD. Stable total right internal jugular central venous catheter. Unchanged LVAD device. Stable cardiomegaly status post CABG. Normal pulmonary vascularity. Bibasilar atelectasis. No focal consolidation, pleural effusion, or pneumothorax. No acute osseous abnormality. IMPRESSION: Stable chest.  No active disease. Electronically Signed   By: Titus Dubin M.D.   On: 02/23/2018 16:45   Ir Paracentesis  Result Date: 02/23/2018 INDICATION: Congestive heart failure. LVAD. Recurrent ascites. Request for therapeutic paracentesis. EXAM: ULTRASOUND GUIDED PARACENTESIS MEDICATIONS: 1% Lidocaine = 10 mL COMPLICATIONS: None immediate.  PROCEDURE: Informed written consent was obtained from the patient after a discussion of the risks, benefits and alternatives to treatment. A timeout was performed prior to the initiation of the procedure. Initial ultrasound scanning demonstrates a large amount of ascites within the right lower abdominal quadrant. The right lower abdomen was prepped and draped in the usual sterile fashion. 1% lidocaine with epinephrine was used for local anesthesia. Following this, a 19 gauge, 7-cm, Yueh catheter was introduced. An ultrasound image was saved for documentation  purposes. The paracentesis was performed. The catheter was removed and a dressing was applied. The patient tolerated the procedure well without immediate post procedural complication. FINDINGS: A total of approximately 5.6 liters of clear yellow fluid was removed. IMPRESSION: Successful ultrasound-guided paracentesis yielding 5.6 liters of peritoneal fluid. Read by: Gareth Eagle, PA-C Electronically Signed   By: [name] M.D.   On: 02/23/2018 16:44     Medications:     Scheduled Medications: . amiodarone  200 mg Oral Daily  . citalopram  20 mg Oral Daily  . digoxin  0.125 mg Oral Daily  . gabapentin  300 mg Oral BID  . insulin aspart  0-9 Units Subcutaneous TID WC  . lactulose  45 g Oral TID  . magnesium oxide  400 mg Oral Daily  . metolazone  2.5 mg Oral Daily  . midodrine  10 mg Oral TID WC  . multivitamin with minerals  1 tablet Oral Daily  . pantoprazole  40 mg Oral Daily  . potassium chloride SA  40 mEq Oral BID  . spironolactone  25 mg Oral Daily  . sucralfate  1 g Oral TID WC & HS  . warfarin  7.5 mg Oral ONCE-1800  . Warfarin - Pharmacist Dosing Inpatient   Does not apply q1800    Infusions: . DOBUTamine 7.5 mcg/kg/min (02/25/18 0852)  . furosemide (LASIX) infusion 20 mg/hr (02/25/18 0300)    PRN Medications: acetaminophen, cyclobenzaprine, docusate sodium, ondansetron, senna-docusate, traZODone   Patient Profile  Ronald Miller.is a 62 y.o.male with history of chronic systolic HF s/p Medtronic ICD 2014, CAD s/p CABG in 2000, HTN, Hx of cocaine abuse, Tobacco abuse, Depression, PAF, and COPD. HM3 placed 09/09/2016. In April 2018 he was placed on milrinone but later switched to dobutamine. He remains on dobutamine 7.22mcg  Admitting today with marked volume overload.    Assessment/Plan:     1.Acute/Chronic end-stage biventricular systolic LF:YBOF 75% due to ICM ->s/p Echo 08/24/16 LVEF 15%, RV mild dilated, moderately reduced. 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 Has been turned down for transplant at [facility].  Marked volume overload. He was discharged the end of April. Weight was 189 pounds,  - Remains on dobutamine 7.5 mcg and midodrine. CO-OX marginal but improved to 53%. No room to titrate dobutamine.  - Continue midodrine 10 TID for low MAPs and cirrhotic physiology. - Remains volume overloaded but improving. Continue lasix drip to 20 mg per hour and 2.5 mg metolazone daily. Supp K+. Add TED hose  - Off sildenafil due to hypotension - No beta blocker with RV failure.  - VAD interrogated personally. Parameters stable. - Warfarin with goal INR 2.0 - 2.5 + ASA 81 daily.INR 2.5  Discussed dosing with PharmD personally. -LDH stable 155 - VAD interrogated personally. Parameters stable. - Not transplant candidate due to social issues, noncompliance and cirrhosis (has been turned down by [facility]) 2. CAD: Severe 3v- CAD s/p CABG with occluded grafts as above except for LIMA.  -No s/s ischemia.  Continue current therapy.  3. DMII:  - Continue insulin regimen. Start sliding scale.  4. Tobacco abuse:  - Reports complete cessation 5. Atrial fibrillation, paroxysmal - ICD reprogrammed yesterday - Continue amio 200 daily. TSH in am  6. Anxiety/Depression - Continue Celexa 20mg  daily . - Palliative Care consult.  7. Cirrhosis with hepatic encephalopathy - likely combination ETOH and chronic RV failure -No asterictixis. -Ammonia level 30 on admit. Continue lactulose at 45 tid -S/P Paracentesis 5/23 with 5.6 liters removed.  8. Anemia, iron-deficiency:  - Has had GI work up with EGD and Capsule Endoscopy showing severe gastropath No evidence of active bleeding. Continue carafate. Hgb 8.1. Daily CBC 9. GOC: Palliative Care consulted    He has no insight into his disease and no ability for self care. Will need long-term residence at [place] at d/c. He is DNR/DNI.    I reviewed the LVAD parameters from today, and compared the results to  the patient's prior recorded data.  No programming changes were made.  The LVAD is functioning within specified parameters.  The patient performs LVAD self-test daily.  LVAD interrogation was negative for any significant power changes, alarms or PI events/speed drops.  LVAD equipment check completed and is in good working order.  Back-up equipment present.   LVAD education done on emergency procedures and precautions and reviewed exit site care.  Length of Stay: 2  Glori Bickers, MD 02/25/2018, 12:24 PM  VAD Team --- VAD ISSUES ONLY--- Pager [phone] (7am - 7am)  Advanced Heart Failure Team  Pager [phone] (M-F; 7a - 4p)  Please contact [facility] for night-coverage after hours (4p -7a ) and weekends on [email or web address]

## 2018-02-26 DIAGNOSIS — T82118A Breakdown (mechanical) of other cardiac electronic device, initial encounter: Secondary | ICD-10-CM

## 2018-02-26 LAB — BASIC METABOLIC PANEL
ANION GAP: 9 (ref 5–15)
BUN: 22 mg/dL — ABNORMAL HIGH (ref 6–20)
CHLORIDE: 88 mmol/L — AB (ref 101–111)
CO2: 31 mmol/L (ref 22–32)
Calcium: 8.2 mg/dL — ABNORMAL LOW (ref 8.9–10.3)
Creatinine, Ser: 1.45 mg/dL — ABNORMAL HIGH (ref 0.61–1.24)
GFR calc Af Amer: 59 mL/min — ABNORMAL LOW (ref >60)
GFR, EST NON AFRICAN AMERICAN: 51 mL/min — AB (ref >60)
GLUCOSE: 161 mg/dL — AB (ref 65–99)
POTASSIUM: 4 mmol/L (ref 3.5–5.1)
SODIUM: 128 mmol/L — AB (ref 135–145)

## 2018-02-26 LAB — GLUCOSE, CAPILLARY
GLUCOSE-CAPILLARY: 243 mg/dL — AB (ref 65–99)
Glucose-Capillary: 159 mg/dL — ABNORMAL HIGH (ref 65–99)
Glucose-Capillary: 178 mg/dL — ABNORMAL HIGH (ref 65–99)
Glucose-Capillary: 234 mg/dL — ABNORMAL HIGH (ref 65–99)

## 2018-02-26 LAB — CBC
HCT: 28.9 % — ABNORMAL LOW (ref 39.0–52.0)
HEMOGLOBIN: 8.3 g/dL — AB (ref 13.0–17.0)
MCH: 22.3 pg — ABNORMAL LOW (ref 26.0–34.0)
MCHC: 28.7 g/dL — ABNORMAL LOW (ref 30.0–36.0)
MCV: 77.7 fL — AB (ref 78.0–100.0)
PLATELETS: 178 10*3/uL (ref 150–400)
RBC: 3.72 MIL/uL — AB (ref 4.22–5.81)
RDW: 17.8 % — ABNORMAL HIGH (ref 11.5–15.5)
WBC: 6.6 10*3/uL (ref 4.0–10.5)

## 2018-02-26 LAB — LACTATE DEHYDROGENASE: LDH: 139 U/L (ref 98–192)

## 2018-02-26 LAB — COOXEMETRY PANEL
CARBOXYHEMOGLOBIN: 1.6 % — AB (ref 0.5–1.5)
Carboxyhemoglobin: 1.9 % — ABNORMAL HIGH (ref 0.5–1.5)
Methemoglobin: 1.7 % — ABNORMAL HIGH (ref 0.0–1.5)
Methemoglobin: 1.5 % (ref 0.0–1.5)
O2 SAT: 42.4 %
O2 SAT: 55.5 %
Total hemoglobin: 8.6 g/dL — ABNORMAL LOW (ref 12.0–16.0)
Total hemoglobin: 8.2 g/dL — ABNORMAL LOW (ref 12.0–16.0)

## 2018-02-26 LAB — MAGNESIUM: MAGNESIUM: 1.9 mg/dL (ref 1.7–2.4)

## 2018-02-26 LAB — PROTIME-INR
INR: 2.35
Prothrombin Time: 25.5 seconds — ABNORMAL HIGH (ref 11.4–15.2)

## 2018-02-26 MED ORDER — ACETAZOLAMIDE ER 500 MG PO CP12
500.0000 mg | ORAL_CAPSULE | Freq: Two times a day (BID) | ORAL | Status: DC
  Administered 2018-02-26 – 2018-03-06 (×16): 500 mg via ORAL
  Filled 2018-02-26 (×18): qty 1

## 2018-02-26 MED ORDER — WARFARIN SODIUM 7.5 MG PO TABS
7.5000 mg | ORAL_TABLET | Freq: Once | ORAL | Status: AC
  Administered 2018-02-26: 7.5 mg via ORAL
  Filled 2018-02-26: qty 1

## 2018-02-26 NOTE — Progress Notes (Signed)
ANTICOAGULATION CONSULT NOTE - [place] for warfarin Indication: LVAD  Allergies  Allergen Reactions  . Chlorhexidine Gluconate Itching and Rash  . Codeine Nausea And Vomiting and Other (See Comments)    In high doses  . Lipitor [Atorvastatin] Nausea Only and Other (See Comments)    Nausea with high doses, tolerates 20mg  dose (08/22/16)  . Tape Other (See Comments)    Paper tape is ok    Patient Measurements: Height: 5\' 9"  (175.3 cm) Weight: 214 lb (97.1 kg) IBW/kg (Calculated) : 70.7  Vital Signs: Temp: 98 F (36.7 C) (05/26 0341) Temp Source: Oral (05/26 0341) BP: 92/81 (05/26 0341) Pulse Rate: 75 (05/26 0341)  Labs: Recent Labs    02/24/18 0450 02/25/18 0500 02/26/18 0315  HGB 8.1* 8.1* 8.3*  HCT 27.9* 27.9* 28.9*  PLT 159 148* 178  LABPROT 27.7* 26.6* 25.5*  INR 2.61 2.47 2.35  CREATININE 1.55* 1.28* 1.45*    Estimated Creatinine Clearance: 61.5 mL/min (A) (by C-G formula based on SCr of 1.45 mg/dL (H)).   Medical History: Past Medical History:  Diagnosis Date  . AICD (automatic cardioverter/defibrillator) present   . Anxiety   . ASCVD (arteriosclerotic cardiovascular disease)   . Chronic systolic CHF (congestive heart failure) ([place])   . COPD (chronic obstructive pulmonary disease) ([place])   . Coronary artery disease   . Depression   . GI bleed   . High cholesterol   . History of blood transfusion   . History of cocaine abuse   . Hypertension   . LVAD (left ventricular assist device) present ([place])   . Presence of permanent cardiac pacemaker   . Shortness of breath dyspnea   . Suicidal ideation   . Tobacco abuse   . Type II diabetes mellitus (HCC)      Assessment: 59 yoM s/p HM3 LVAD on warfarin PTA. INR therapeutic at 2.35, LDH stable.  PTA Dose = 5mg  Mon/Fri, 7.5mg  all other days  Goal of Therapy:  INR 1.8-2.4 Monitor platelets by anticoagulation protocol: Yes   Plan:  -Warfarin 7.5mg  PO x1 tonight -Daily  protime  Arrie Senate, PharmD, BCPS PGY-2 Cardiology Pharmacy Resident Pager: [phone] 02/26/2018

## 2018-02-26 NOTE — Clinical Social Work Note (Signed)
Clinical Social Work Assessment  Patient Details  Name: Ronald Miller. MRN: [id number] Date of Birth: 1956-09-09  Date of referral:  02/26/18               Reason for consult:  Facility Placement                Permission sought to share information with:  Family Supports Permission granted to share information::  Yes, Verbal Permission Granted  Name::     Iantha Fallen  Agency::  [facility]  Relationship::  Sister  Contact Information:  [phone]  Housing/Transportation Living arrangements for the past 2 months:  Single Family Home Source of Information:  Patient Patient Interpreter Needed:  None Criminal Activity/Legal Involvement Pertinent to Current Situation/Hospitalization:  No - Comment as needed Significant Relationships:  Siblings Lives with:  Siblings Do you feel safe going back to the place where you live?  No Need for family participation in patient care:  No (Coment)  Care giving concerns:  Pt is alert and oriented. Pt lives home with his sister. No caregiver concerns noted at this time.    Social Worker assessment / plan:  CSW spoke with pt at bedside. PT is recommending SNF at d/c. Pt is understanding and states he wants to go to [facility]. Pt states he has been to [facility] in the past. CSW confirmed pt has [facility] and explained the authorization process. Pt denies any concerns at this time. CSW to follow up with [facility].   Employment status:  Retired Nurse, adult PT Recommendations:  [place] / Referral to community resources:  [place]  Patient/Family's Response to care:  Pt verbalized understanding of CSW role and expressed appreciation for support. Pt denies any concern regarding pt care at this time.   Patient/Family's Understanding of and Emotional Response to Diagnosis, Current Treatment, and Prognosis:  Pt understanding and realistic regarding physical  limitations. Pt understands the need for SNF placement at d/c. Pt agreeable to SNF placement at d/c, at this time. Pt's responses emotionally appropriate during conversation with CSW. Pt denies any concern regarding treatment plan at this time. CSW will continue to provide support and facilitate d/c needs.   Emotional Assessment Appearance:  Appears stated age Attitude/Demeanor/Rapport:  (Patient was appropriate) Affect (typically observed):  Accepting, Appropriate, Calm Orientation:  Oriented to  Time, Oriented to Place, Oriented to Self, Oriented to Situation Alcohol / Substance use:  Not Applicable Psych involvement (Current and /or in the community):  No (Comment)  Discharge Needs  Concerns to be addressed:  Basic Needs, Care Coordination Readmission within the last 30 days:  No Current discharge risk:  Dependent with Mobility Barriers to Discharge:  Continued Medical Work up, [employer], LCSW 02/26/2018, 9:36 AM
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
## 2018-02-26 NOTE — Progress Notes (Signed)
Advanced Heart Failure VAD Team Note  PCP-Cardiologist: Glori Bickers, MD   Subjective:    Remains on dobutamine 7.5 mcg/kg/min. Lasix gtt now at 20. Says he is diuresing but weight unchanged. CVP remains in 15 range  Denies orthopnea or PND. Appetite improved  Tele with pacer spikes on QRS. Device interrogated by Dr. Lovena Le. We discussed at bedside and device reprogrammed to AV pace. Device at EOL.   S/P Paracentesis 5/23 -->5.6 liters removed.   LVAD INTERROGATION:  HeartMate III LVAD:   Flow 5.4 liters/min, speed 5800, power 5.0, PI 2.3 Rare PI events.    Objective:    Vital Signs:   Temp:  [97.5 F (36.4 C)-98.4 F (36.9 C)] 98.4 F (36.9 C) (05/26 1116) Pulse Rate:  [71-119] 119 (05/26 1116) Resp:  [14-21] 21 (05/26 1116) BP: (73-95)/(47-81) 79/56 (05/26 1116) SpO2:  [93 %-97 %] 93 % (05/26 1116) Weight:  [97.1 kg (214 lb)] 97.1 kg (214 lb) (05/26 0457) Last BM Date: 02/25/18 Mean arterial Pressure 70s Intake/Output:   Intake/Output Summary (Last 24 hours) at 02/26/2018 1254 Last data filed at 02/26/2018 1116 Gross per 24 hour  Intake 1171 ml  Output 3612 ml  Net -2441 ml     Physical Exam   CVP ~15 General:  NAD.  HEENT: normal  Neck: supple. JVP to ear  Carotids 2+ bilat; no bruits. No lymphadenopathy or thryomegaly appreciated. Cor: LVAD hum.  Lungs: Clear. Abdomen: obese soft, nontender, + mildly distended. No hepatosplenomegaly. No bruits or masses. Good bowel sounds. Driveline site clean. Anchor in place.  Extremities: no cyanosis, clubbing, rash. Warm no edema  RUE picc  2+ edema  Neuro: alert & oriented x 3. No focal deficits. Moves all 4 without problem    Telemetry    A fib 60-70ss with v-pacing multiple pacer spikes on QRS Personally reviewed  EKG    n/a  Labs   Basic Metabolic Panel: Recent Labs  Lab 02/23/18 1022 02/24/18 0450 02/25/18 0500 02/26/18 0315  NA 132* 130* 130* 128*  K 4.5 4.5 3.7 4.0  CL 93* 90* 90* 88*  CO2  31 29 31 31   GLUCOSE 186* 176* 156* 161*  BUN 32* 30* 26* 22*  CREATININE 1.64* 1.55* 1.28* 1.45*  CALCIUM 8.5* 8.0* 8.2* 8.2*  MG  --  2.1 1.9 1.9    Liver Function Tests: Recent Labs  Lab 02/23/18 1022  AST 25  ALT 17  ALKPHOS 94  BILITOT 1.0  PROT 6.7  ALBUMIN 3.0*   No results for input(s): LIPASE, AMYLASE in the last 168 hours. Recent Labs  Lab 02/23/18 1022  AMMONIA 30    CBC: Recent Labs  Lab 02/23/18 1022 02/24/18 0450 02/25/18 0500 02/26/18 0315  WBC 7.6 7.4 6.5 6.6  HGB 8.5* 8.1* 8.1* 8.3*  HCT 30.2* 27.9* 27.9* 28.9*  MCV 78.9 78.2 76.4* 77.7*  PLT 177 159 148* 178    INR: Recent Labs  Lab 02/23/18 1022 02/24/18 0450 02/25/18 0500 02/26/18 0315  INR 2.19 2.61 2.47 2.35    Other results:     Imaging   No results found.   Medications:     Scheduled Medications: . amiodarone  200 mg Oral Daily  . citalopram  20 mg Oral Daily  . digoxin  0.125 mg Oral Daily  . gabapentin  300 mg Oral BID  . insulin aspart  0-9 Units Subcutaneous TID WC  . lactulose  45 g Oral TID  . magnesium oxide  400 mg Oral  Daily  . metolazone  2.5 mg Oral Daily  . midodrine  10 mg Oral TID WC  . multivitamin with minerals  1 tablet Oral Daily  . pantoprazole  40 mg Oral Daily  . potassium chloride SA  40 mEq Oral BID  . spironolactone  25 mg Oral Daily  . sucralfate  1 g Oral TID WC & HS  . warfarin  7.5 mg Oral ONCE-1800  . Warfarin - Pharmacist Dosing Inpatient   Does not apply q1800    Infusions: . DOBUTamine 7.5 mcg/kg/min (02/26/18 1009)  . furosemide (LASIX) infusion 20 mg/hr (02/26/18 0300)    PRN Medications: acetaminophen, cyclobenzaprine, docusate sodium, ondansetron, senna-docusate, traZODone   Patient Profile  Ronald Milleris a 62 y.o.male with history of chronic systolic HF s/p Medtronic ICD 2014, CAD s/p CABG in 2000, HTN, Hx of cocaine abuse, Tobacco abuse, Depression, PAF, and COPD. HM3 placed 09/09/2016. In April 2018 he was  placed on milrinone but later switched to dobutamine. He remains on dobutamine 7.98mcg  Admitting today with marked volume overload.    Assessment/Plan:     1.Acute/Chronic end-stage biventricular systolic GE:ZMOQ 94% due to ICM ->s/p Echo 08/24/16 LVEF 15%, RV mild dilated, moderately reduced. -->S/P HM3 LVAD 09/09/2016. Has been turned down for [facility] at [facility].  Marked volume overload. He was discharged the end of April. Weight was 189 pounds,  - Remains on dobutamine 7.5 mcg and midodrine. CO-OX down to 42%%. Will repeat - Continue midodrine 10 TID for low MAPs and cirrhotic physiology.MAPs 60-70s today - Remains volume overloaded. Weigh unchanged Continue lasix drip to 20 mg per and 2.5 mg metolazone daily. Add diamox.. Add TED hose  - Off sildenafil due to hypotension - No beta blocker with RV failure.  - VAD interrogated personally. Parameters stable. - Warfarin with goal INR 2.0 - 2.5 + ASA 81 daily.INR 2.35 Discussed dosing with PharmD personally. -LDH 139 -VAD interrogated personally. Parameters stable. - Not transplant candidate due to social issues, noncompliance and cirrhosis (has been turned down by [facility]) 2. CAD: Severe 3v- CAD s/p CABG with occluded grafts as above except for LIMA.  -No s/s ischemia. Continue current therapy.  3. DMII:  - Continue insulin regimen. Start sliding scale.  4. Tobacco abuse:  - Reports complete cessation 5. Atrial fibrillation, paroxysmal - Device interrogated with Dr. Lovena Le. Device at EOL  (will need to follow closely. Not good candidate for gen change but may not have a choice) - Rhythm very slow sinus brady with ventricular escape.  - Reprogrammed to AV pace - Continue amio 200 daily. TSH in am  6. Anxiety/Depression - Continue Celexa 20mg  daily . - Palliative Care consult.  7. Cirrhosis with hepatic encephalopathy - likely combination ETOH and chronic RV failure -No asterictixis. -Ammonia level 30 on admit. Continue  lactulose at 45 tid. Recheck in am  -S/P Paracentesis 5/23 with 5.6 liters removed.  8. Anemia, iron-deficiency:  - Has had GI work up with EGD and Capsule Endoscopy showing severe gastropath No evidence of active bleeding. Continue carafate. Hgb 8.3. Daily CBC 9. GOC: Palliative Care consulted    He has no insight into his disease and no ability for self care. Will need long-term residence at [place] at d/c. He says it will be cheaper for him if he returns by 5/30 He is DNR/DNI.    I reviewed the LVAD parameters from today, and compared the results to the patient's prior recorded data.  No programming changes were made.  The LVAD is functioning within specified parameters.  The patient performs LVAD self-test daily.  LVAD interrogation was negative for any significant power changes, alarms or PI events/speed drops.  LVAD equipment check completed and is in good working order.  Back-up equipment present.   LVAD education done on emergency procedures and precautions and reviewed exit site care.  Length of Stay: 3  Glori Bickers, MD 02/26/2018, 12:54 PM  VAD Team --- VAD ISSUES ONLY--- Pager [phone] (7am - 7am)  Advanced Heart Failure Team  Pager [phone] (M-F; 7a - 4p)  Please contact [facility] for night-coverage after hours (4p -7a ) and weekends on [email or web address]

## 2018-02-26 NOTE — Progress Notes (Signed)
Patient ID: Ronald Klas., male   DOB: 1956-02-11, 62 y.o.   MRN: [id number]  EP Attending  Asked by Dr. Reine Just to interogate and reprogram the patient's device as he was noted to be pacing in the middle of his QRS. Interogation of the medtronic biv ICD demonstrates: 1. EOS 2. Normal lead function 3. Underlying rhythm a slow idioventricular rhythm with sinus rates in the 40's and a ventricular rate in the 75 range.  4. Tachy therapies are turned off 5. I reprogrammed the device to DDD 80/min.  6. His inadvertant pacing resolved with over drive suppression of the underlying arrhythmia.  Mikle Bosworth.D.

## 2018-02-27 LAB — CBC
HEMATOCRIT: 24.9 % — AB (ref 39.0–52.0)
Hemoglobin: 7 g/dL — ABNORMAL LOW (ref 13.0–17.0)
MCH: 21.9 pg — AB (ref 26.0–34.0)
MCHC: 28.1 g/dL — ABNORMAL LOW (ref 30.0–36.0)
MCV: 78.1 fL (ref 78.0–100.0)
Platelets: 156 10*3/uL (ref 150–400)
RBC: 3.19 MIL/uL — ABNORMAL LOW (ref 4.22–5.81)
RDW: 17.7 % — AB (ref 11.5–15.5)
WBC: 5.6 10*3/uL (ref 4.0–10.5)

## 2018-02-27 LAB — COOXEMETRY PANEL
CARBOXYHEMOGLOBIN: 2 % — AB (ref 0.5–1.5)
Methemoglobin: 1.5 % (ref 0.0–1.5)
O2 Saturation: 72.5 %
Total hemoglobin: 8.5 g/dL — ABNORMAL LOW (ref 12.0–16.0)

## 2018-02-27 LAB — GLUCOSE, CAPILLARY
GLUCOSE-CAPILLARY: 165 mg/dL — AB (ref 65–99)
Glucose-Capillary: 226 mg/dL — ABNORMAL HIGH (ref 65–99)
Glucose-Capillary: 161 mg/dL — ABNORMAL HIGH (ref 65–99)
Glucose-Capillary: 166 mg/dL — ABNORMAL HIGH (ref 65–99)

## 2018-02-27 LAB — BASIC METABOLIC PANEL
Anion gap: 8 (ref 5–15)
BUN: 20 mg/dL (ref 6–20)
CALCIUM: 8.5 mg/dL — AB (ref 8.9–10.3)
CO2: 35 mmol/L — AB (ref 22–32)
Chloride: 89 mmol/L — ABNORMAL LOW (ref 101–111)
Creatinine, Ser: 1.17 mg/dL (ref 0.61–1.24)
GFR calc Af Amer: >60 mL/min (ref >60)
GFR calc non Af Amer: >60 mL/min (ref >60)
GLUCOSE: 145 mg/dL — AB (ref 65–99)
Potassium: 3.4 mmol/L — ABNORMAL LOW (ref 3.5–5.1)
Sodium: 132 mmol/L — ABNORMAL LOW (ref 135–145)

## 2018-02-27 LAB — AMMONIA: AMMONIA: 28 umol/L (ref 9–35)

## 2018-02-27 LAB — MAGNESIUM: Magnesium: 2 mg/dL (ref 1.7–2.4)

## 2018-02-27 LAB — LACTATE DEHYDROGENASE: LDH: 129 U/L (ref 98–192)

## 2018-02-27 LAB — PROTIME-INR
INR: 2.07
Prothrombin Time: 23.1 seconds — ABNORMAL HIGH (ref 11.4–15.2)

## 2018-02-27 MED ORDER — WARFARIN SODIUM 5 MG PO TABS
5.0000 mg | ORAL_TABLET | Freq: Once | ORAL | Status: AC
  Administered 2018-02-27: 5 mg via ORAL
  Filled 2018-02-27: qty 1

## 2018-02-27 MED ORDER — POTASSIUM CHLORIDE CRYS ER 20 MEQ PO TBCR
40.0000 meq | EXTENDED_RELEASE_TABLET | Freq: Once | ORAL | Status: AC
  Administered 2018-02-27: 40 meq via ORAL
  Filled 2018-02-27: qty 2

## 2018-02-27 NOTE — Progress Notes (Signed)
Advanced Heart Failure VAD Team Note  PCP-Cardiologist: Glori Bickers, MD   Subjective:    Remains on dobutamine 7.5 mcg/kg/min. Lasix gtt now at 20.  Diuresing well. Weight down 6 pounds. Denies SOB, orthopnea or PND. Still bloated. Co-ox 73% CVP 11-12    S/P Paracentesis 5/23 -->5.6 liters removed.   LVAD INTERROGATION:  HeartMate III LVAD:   Flow 5.4 liters/min, speed 5800, power 4.0, PI 2.9 Personally reviewed   Objective:    Vital Signs:   Temp:  [97.5 F (36.4 C)-98.4 F (36.9 C)] 97.7 F (36.5 C) (05/27 1541) Pulse Rate:  [77-82] 79 (05/27 1541) Resp:  [15-20] 20 (05/27 1541) BP: (81-127)/(40-79) 127/79 (05/27 1541) SpO2:  [89 %-94 %] 94 % (05/27 1541) Weight:  [94.8 kg (208 lb 15.9 oz)] 94.8 kg (208 lb 15.9 oz) (05/27 0528) Last BM Date: 02/25/18 Mean arterial Pressure 70s Intake/Output:   Intake/Output Summary (Last 24 hours) at 02/27/2018 1623 Last data filed at 02/27/2018 1556 Gross per 24 hour  Intake 1051.59 ml  Output 5375 ml  Net -4323.41 ml     Physical Exam   CVP ~11-12 General:  Sitting on side of bed NAD.  HEENT: normal  Neck: supple. JVP to jaw   Carotids 2+ bilat; no bruits. No lymphadenopathy or thryomegaly appreciated. Cor: LVAD hum.  Lungs: Clear. Abdomen: obese soft, nontender, ++ distended. No hepatosplenomegaly. No bruits or masses. Good bowel sounds. Driveline site clean. Anchor in place.  Extremities: no cyanosis, clubbing, rash. Warm no 1-2+ edema  Neuro: alert & oriented x 3. No focal deficits. Moves all 4 without problem    Telemetry   AV pacing 80s. Personally reviewed   EKG    n/a  Labs   Basic Metabolic Panel: Recent Labs  Lab 02/23/18 1022 02/24/18 0450 02/25/18 0500 02/26/18 0315 02/27/18 0500  NA 132* 130* 130* 128* 132*  K 4.5 4.5 3.7 4.0 3.4*  CL 93* 90* 90* 88* 89*  CO2 31 29 31 31  35*  GLUCOSE 186* 176* 156* 161* 145*  BUN 32* 30* 26* 22* 20  CREATININE 1.64* 1.55* 1.28* 1.45* 1.17  CALCIUM  8.5* 8.0* 8.2* 8.2* 8.5*  MG  --  2.1 1.9 1.9 2.0    Liver Function Tests: Recent Labs  Lab 02/23/18 1022  AST 25  ALT 17  ALKPHOS 94  BILITOT 1.0  PROT 6.7  ALBUMIN 3.0*   No results for input(s): LIPASE, AMYLASE in the last 168 hours. Recent Labs  Lab 02/23/18 1022 02/27/18 0500  AMMONIA 30 28    CBC: Recent Labs  Lab 02/23/18 1022 02/24/18 0450 02/25/18 0500 02/26/18 0315 02/27/18 0500  WBC 7.6 7.4 6.5 6.6 5.6  HGB 8.5* 8.1* 8.1* 8.3* 7.0*  HCT 30.2* 27.9* 27.9* 28.9* 24.9*  MCV 78.9 78.2 76.4* 77.7* 78.1  PLT 177 159 148* 178 156    INR: Recent Labs  Lab 02/23/18 1022 02/24/18 0450 02/25/18 0500 02/26/18 0315 02/27/18 0500  INR 2.19 2.61 2.47 2.35 2.07    Other results:     Imaging   No results found.   Medications:     Scheduled Medications: . acetaZOLAMIDE  500 mg Oral Q12H  . amiodarone  200 mg Oral Daily  . citalopram  20 mg Oral Daily  . digoxin  0.125 mg Oral Daily  . gabapentin  300 mg Oral BID  . insulin aspart  0-9 Units Subcutaneous TID WC  . lactulose  45 g Oral TID  . magnesium oxide  400  mg Oral Daily  . metolazone  2.5 mg Oral Daily  . midodrine  10 mg Oral TID WC  . multivitamin with minerals  1 tablet Oral Daily  . pantoprazole  40 mg Oral Daily  . potassium chloride SA  40 mEq Oral BID  . spironolactone  25 mg Oral Daily  . sucralfate  1 g Oral TID WC & HS  . warfarin  5 mg Oral ONCE-1800  . Warfarin - Pharmacist Dosing Inpatient   Does not apply q1800    Infusions: . DOBUTamine 7.5 mcg/kg/min (02/27/18 0801)  . furosemide (LASIX) infusion 20 mg/hr (02/26/18 0300)    PRN Medications: acetaminophen, cyclobenzaprine, docusate sodium, ondansetron, senna-docusate, traZODone   Patient Profile  Ronald Milleris a 62 y.o.male with history of chronic systolic HF s/p Medtronic ICD 2014, CAD s/p CABG in 2000, HTN, Hx of cocaine abuse, Tobacco abuse, Depression, PAF, and COPD. HM3 placed 09/09/2016. In April  2018 he was placed on milrinone but later switched to dobutamine. He remains on dobutamine 7.48mcg  Admitting today with marked volume overload.    Assessment/Plan:     1.Acute/Chronic end-stage biventricular systolic DV:VOHY 07% due to ICM ->s/p Echo 08/24/16 LVEF 15%, RV mild dilated, moderately reduced. -->S/P HM3 LVAD 09/09/2016. Has been turned down for [facility] at [facility].  Marked volume overload. He was discharged the end of April. Weight was 189 pounds,  - Remains on dobutamine 7.5 mcg and midodrine. CO-OX back up to 72%. Continue dobutamine  - Continue midodrine 10 TID for low MAPs and cirrhotic physiology.MAPs 70-80s today - Remains volume overloaded. Weight coming down. Continue current regimen. May need another paracentesis this wekk  - Off sildenafil due to hypotension - No beta blocker with RV failure.  - VAD interrogated personally. Parameters stable. - Warfarin with goal INR 2.0 - 2.5 + ASA 81 daily.INR 2.07  Discussed dosing with PharmD personally. -LDH 129 - VAD interrogated personally. Parameters stable. - Not transplant candidate due to social issues, noncompliance and cirrhosis (has been turned down by [facility]) 2. CAD: Severe 3v- CAD s/p CABG with occluded grafts as above except for LIMA.  - No s/s ischemia. Continue current therapy.  3. DMII:  - Continue insulin regimen. Start sliding scale.  4. Tobacco abuse:  - Reports complete cessation 5. Atrial fibrillation, paroxysmal - Device interrogated with Dr. Lovena Le on 5/26. Device at EOL  (will need to follow closely. Not good candidate for gen change but may not have a choice) - Rhythm very slow sinus brady with ventricular escape.  - Reprogrammed to AV pace. Looks good today  - Continue amio 200 daily. TSH in am  6. Anxiety/Depression - Continue Celexa 20mg  daily . - Palliative Care consult.  7. Cirrhosis with hepatic encephalopathy - likely combination ETOH and chronic RV failure -No  asterictixis. -Ammonia level 30 on admit. Continue lactulose at 45 tid. Recheck today is 28  -S/P Paracentesis 5/23 with 5.6 liters removed.  8. Anemia, iron-deficiency:  - Has had GI work up with EGD and Capsule Endoscopy showing severe gastropath No evidence of active bleeding. Continue carafate. - Hgb 7.0 today on CBC but 8.5 on co-ox. No active bleeding visible. Recheck in am 9. GOC: Palliative Care consulted    He has no insight into his disease and no ability for self care. Will need long-term residence at [place] at d/c. He says it will be cheaper for him if he returns by 6/1. Will try to arrange He is DNR/DNI.  I reviewed the LVAD parameters from today, and compared the results to the patient's prior recorded data.  No programming changes were made.  The LVAD is functioning within specified parameters.  The patient performs LVAD self-test daily.  LVAD interrogation was negative for any significant power changes, alarms or PI events/speed drops.  LVAD equipment check completed and is in good working order.  Back-up equipment present.   LVAD education done on emergency procedures and precautions and reviewed exit site care.  Length of Stay: 4  Glori Bickers, MD 02/27/2018, 4:23 PM  VAD Team --- VAD ISSUES ONLY--- Pager [phone] (7am - 7am)  Advanced Heart Failure Team  Pager [phone] (M-F; 7a - 4p)  Please contact [facility] for night-coverage after hours (4p -7a ) and weekends on [email or web address]

## 2018-02-27 NOTE — Evaluation (Signed)
Occupational Therapy Evaluation Patient Details Name: Ronald Miller. MRN: [id number] DOB: [birth year] Today's Date: 02/27/2018    History of Present Illness Pt is a 62 y.o. male with PMH including LVAD implant (09/2016), CHF, CAD, HTN, depression, COPD, admitted 02/23/18 with marked abdominal bloating; worked up for acute on chronic end-stage biventricular systolic HF. Of note, multiple recent admission with recent d/c home from [facility] SNF.   Clinical Impression   Pt was assisted for ADL and ambulating with a RW in SNF. Presents with decreased activity tolerance, decreased standing balance and impaired cognition. He requires min assist for ADL. Pt will need further rehab upon d/c, recommending SNF. Will follow acutely.    Follow Up Recommendations  SNF;Supervision/Assistance - 24 hour    Equipment Recommendations       Recommendations for Other Services       Precautions / Restrictions Precautions Precautions: Fall Precaution Comments: LVAD Restrictions Weight Bearing Restrictions: No      Mobility Bed Mobility Overal bed mobility: Modified Independent                Transfers Overall transfer level: Needs assistance Equipment used: Rolling walker (2 wheeled) Transfers: Sit to/from Stand Sit to Stand: Min guard         General transfer comment: Cues for safety with equipment    Balance Overall balance assessment: Needs assistance   Sitting balance-Leahy Scale: Good Sitting balance - Comments: no LOB with donning socks     Standing balance-Leahy Scale: Poor Standing balance comment: requires at least one hand to balance in standing                           ADL either performed or assessed with clinical judgement   ADL Overall ADL's : Needs assistance/impaired Eating/Feeding: Minimal assistance;Sitting Eating/Feeding Details (indicate cue type and reason): assist to open packages Grooming: Wash/dry hands;Wash/dry face;Sitting;Set  up   Upper Body Bathing: Sitting;Set up   Lower Body Bathing: Min guard;Sit to/from stand   Upper Body Dressing : Set up;Sitting   Lower Body Dressing: Min guard;Sit to/from stand   Toilet Transfer: Min guard(sit to stand to use urinal)   Toileting- Water quality scientist and Hygiene: Min guard;Sit to/from stand               Vision Baseline Vision/History: Wears glasses Patient Visual Report: No change from baseline       Perception     Praxis      Pertinent Vitals/Pain Pain Assessment: No/denies pain     Hand Dominance Right   Extremity/Trunk Assessment Upper Extremity Assessment Upper Extremity Assessment: RUE deficits/detail;LUE deficits/detail RUE Coordination: decreased fine motor LUE Coordination: decreased fine motor       Cervical / Trunk Assessment Cervical / Trunk Assessment: Kyphotic   Communication Communication Communication: No difficulties   Cognition Arousal/Alertness: Awake/alert Behavior During Therapy: Flat affect Overall Cognitive Status: No family/caregiver present to determine baseline cognitive functioning                                 General Comments: easily frustrated with questions and requests to demonstrate abilities   General Comments       Exercises     Shoulder Instructions      Home Living Family/patient expects to be discharged to:: Skilled nursing facility Living Arrangements: Alone Available Help at Discharge: Family;Available PRN/intermittently Type of Home: [place]  Access: Stairs to enter [employer] of Steps: 2 Entrance Stairs-Rails: Right Home Layout: One level     Bathroom Shower/Tub: Teacher, early years/pre: Standard     Home Equipment: Environmental consultant - 4 wheels          Prior Functioning/Environment Level of Independence: Independent with assistive device(s)        Comments: per chart pt left SNF on 5/18 and had assist of his sister for IADL, was not  showering        OT Problem List: Decreased activity tolerance;Impaired balance (sitting and/or standing);Decreased cognition;Decreased safety awareness      OT Treatment/Interventions: Self-care/ADL training;Therapeutic exercise;Energy conservation;DME and/or AE instruction;Therapeutic activities;Patient/family education;Balance training    OT Goals(Current goals can be found in the care plan section) Acute Rehab OT Goals Patient Stated Goal: Get longer term rehab OT Goal Formulation: With patient Time For Goal Achievement: 03/13/18 Potential to Achieve Goals: Good ADL Goals Pt Will Perform Grooming: with supervision;standing Pt Will Perform Lower Body Bathing: with supervision;sit to/from stand Pt Will Perform Lower Body Dressing: with supervision;sit to/from stand Pt Will Transfer to Toilet: with supervision;ambulating;bedside commode Pt Will Perform Toileting - Clothing Manipulation and hygiene: with supervision;sit to/from stand Pt/caregiver will Perform Home Exercise Program: Increased strength;Both right and left upper extremity;With theraputty Additional ADL Goal #1: Pt will perform LVAD power source change independently.  OT Frequency: Min 2X/week   Barriers to D/C: Decreased caregiver support          Co-evaluation              AM-PAC PT "6 Clicks" Daily Activity     Outcome Measure Help from another person eating meals?: A Little Help from another person taking care of personal grooming?: A Little Help from another person toileting, which includes using toliet, bedpan, or urinal?: A Little Help from another person bathing (including washing, rinsing, drying)?: A Little Help from another person to put on and taking off regular upper body clothing?: None Help from another person to put on and taking off regular lower body clothing?: A Little 6 Click Score: 19   End of Session Equipment Utilized During Treatment: Gait belt;Rolling walker Nurse Communication:  Other (comment)(informed NT of urine output)  Activity Tolerance: Patient tolerated treatment well Patient left: in bed;with call bell/phone within reach  OT Visit Diagnosis: Unsteadiness on feet (R26.81);Other abnormalities of gait and mobility (R26.89);Muscle weakness (generalized) (M62.81);Other symptoms and signs involving cognitive function                Time: 3300-7622 OT Time Calculation (min): 23 min Charges:  OT General Charges $OT Visit: 1 Visit OT Evaluation $OT Eval Moderate Complexity: 1 Mod OT Treatments $Self Care/Home Management : 8-22 mins G-Codes:     2018-03-04 Nestor Lewandowsky, OTR/L Pager: [phone]  Glennie Rodda, Haze Boyden 03-04-2018, 2:11 PM

## 2018-02-27 NOTE — Progress Notes (Signed)
ANTICOAGULATION CONSULT NOTE - [place] for warfarin Indication: LVAD  Allergies  Allergen Reactions  . Chlorhexidine Gluconate Itching and Rash  . Codeine Nausea And Vomiting and Other (See Comments)    In high doses  . Lipitor [Atorvastatin] Nausea Only and Other (See Comments)    Nausea with high doses, tolerates 20mg  dose (08/22/16)  . Tape Other (See Comments)    Paper tape is ok    Patient Measurements: Height: 5\' 9"  (175.3 cm) Weight: 208 lb 15.9 oz (94.8 kg) IBW/kg (Calculated) : 70.7  Vital Signs: Temp: 97.5 F (36.4 C) (05/27 0528) Temp Source: Oral (05/27 0528) BP: 93/40 (05/27 0528) Pulse Rate: 82 (05/27 0528)  Labs: Recent Labs    02/25/18 0500 02/26/18 0315 02/27/18 0500  HGB 8.1* 8.3* 7.0*  HCT 27.9* 28.9* 24.9*  PLT 148* 178 156  LABPROT 26.6* 25.5* 23.1*  INR 2.47 2.35 2.07  CREATININE 1.28* 1.45* 1.17    Estimated Creatinine Clearance: 75.3 mL/min (by C-G formula based on SCr of 1.17 mg/dL).   Medical History: Past Medical History:  Diagnosis Date  . AICD (automatic cardioverter/defibrillator) present   . Anxiety   . ASCVD (arteriosclerotic cardiovascular disease)   . Chronic systolic CHF (congestive heart failure) ([place])   . COPD (chronic obstructive pulmonary disease) ([place])   . Coronary artery disease   . Depression   . GI bleed   . High cholesterol   . History of blood transfusion   . History of cocaine abuse   . Hypertension   . LVAD (left ventricular assist device) present ([place])   . Presence of permanent cardiac pacemaker   . Shortness of breath dyspnea   . Suicidal ideation   . Tobacco abuse   . Type II diabetes mellitus (HCC)      Assessment: 89 yoM s/p HM3 LVAD on warfarin PTA. INR at goal, LDH stable, Hgb down 8.3 to 7.0 - pt denies any S/Sx bleeding.  PTA Dose = 5mg  Mon/Fri, 7.5mg  all other days  Goal of Therapy:  INR 1.8-2.4 Monitor platelets by anticoagulation protocol: Yes   Plan:   -Warfarin 5mg  PO x1 tonight -Daily protime  Arrie Senate, PharmD, BCPS PGY-2 Cardiology Pharmacy Resident Pager: [phone] 02/27/2018

## 2018-02-28 LAB — CBC
HCT: 29.5 % — ABNORMAL LOW (ref 39.0–52.0)
Hemoglobin: 8.3 g/dL — ABNORMAL LOW (ref 13.0–17.0)
MCH: 21.8 pg — ABNORMAL LOW (ref 26.0–34.0)
MCHC: 28.1 g/dL — ABNORMAL LOW (ref 30.0–36.0)
MCV: 77.4 fL — ABNORMAL LOW (ref 78.0–100.0)
PLATELETS: 208 10*3/uL (ref 150–400)
RBC: 3.81 MIL/uL — AB (ref 4.22–5.81)
RDW: 17.6 % — ABNORMAL HIGH (ref 11.5–15.5)
WBC: 6.9 10*3/uL (ref 4.0–10.5)

## 2018-02-28 LAB — BASIC METABOLIC PANEL
Anion gap: 12 (ref 5–15)
BUN: 21 mg/dL — ABNORMAL HIGH (ref 6–20)
CALCIUM: 8.6 mg/dL — AB (ref 8.9–10.3)
CHLORIDE: 86 mmol/L — AB (ref 101–111)
CO2: 33 mmol/L — ABNORMAL HIGH (ref 22–32)
CREATININE: 1.25 mg/dL — AB (ref 0.61–1.24)
Glucose, Bld: 175 mg/dL — ABNORMAL HIGH (ref 65–99)
Potassium: 3.3 mmol/L — ABNORMAL LOW (ref 3.5–5.1)
SODIUM: 131 mmol/L — AB (ref 135–145)

## 2018-02-28 LAB — PROTIME-INR
INR: 2.27
PROTHROMBIN TIME: 24.8 s — AB (ref 11.4–15.2)

## 2018-02-28 LAB — MAGNESIUM: Magnesium: 2.2 mg/dL (ref 1.7–2.4)

## 2018-02-28 LAB — GLUCOSE, CAPILLARY
GLUCOSE-CAPILLARY: 170 mg/dL — AB (ref 65–99)
GLUCOSE-CAPILLARY: 198 mg/dL — AB (ref 65–99)
GLUCOSE-CAPILLARY: 168 mg/dL — AB (ref 65–99)
GLUCOSE-CAPILLARY: 213 mg/dL — AB (ref 65–99)

## 2018-02-28 LAB — COOXEMETRY PANEL
Carboxyhemoglobin: 1.8 % — ABNORMAL HIGH (ref 0.5–1.5)
Methemoglobin: 1.5 % (ref 0.0–1.5)
O2 SAT: 54.2 %
Total hemoglobin: 8.5 g/dL — ABNORMAL LOW (ref 12.0–16.0)

## 2018-02-28 LAB — LACTATE DEHYDROGENASE: LDH: 128 U/L (ref 98–192)

## 2018-02-28 MED ORDER — ADULT MULTIVITAMIN W/MINERALS CH: 1.0000 | ORAL_TABLET | Freq: Every day | ORAL | Status: DC

## 2018-02-28 MED ORDER — DOXYCYCLINE HYCLATE 100 MG PO TABS
100.0000 mg | ORAL_TABLET | Freq: Two times a day (BID) | ORAL | Status: DC
  Administered 2018-02-28 – 2018-03-06 (×13): 100 mg via ORAL
  Filled 2018-02-28 (×13): qty 1

## 2018-02-28 MED ORDER — POTASSIUM CHLORIDE CRYS ER 20 MEQ PO TBCR
40.0000 meq | EXTENDED_RELEASE_TABLET | Freq: Four times a day (QID) | ORAL | Status: AC
  Administered 2018-02-28 (×4): 40 meq via ORAL
  Filled 2018-02-28 (×4): qty 2

## 2018-02-28 MED ORDER — WARFARIN SODIUM 7.5 MG PO TABS
7.5000 mg | ORAL_TABLET | Freq: Once | ORAL | Status: AC
  Administered 2018-02-28: 7.5 mg via ORAL
  Filled 2018-02-28: qty 1

## 2018-02-28 NOTE — Plan of Care (Signed)
  Problem: Cardiac: Goal: LVAD will function as expected and patient will experience no clinical alarms Outcome: Progressing   Problem: Education: Goal: Patient will understand all VAD equipment and how it functions Outcome: Progressing   Problem: Education: Goal: Knowledge of General Education information will improve Outcome: Progressing   Problem: Clinical Measurements: Goal: Ability to maintain clinical measurements within normal limits will improve Outcome: Progressing

## 2018-02-28 NOTE — Progress Notes (Signed)
ANTICOAGULATION CONSULT NOTE - [place] for warfarin Indication: LVAD  Allergies  Allergen Reactions  . Chlorhexidine Gluconate Itching and Rash  . Codeine Nausea And Vomiting and Other (See Comments)    In high doses  . Lipitor [Atorvastatin] Nausea Only and Other (See Comments)    Nausea with high doses, tolerates 20mg  dose (08/22/16)  . Tape Other (See Comments)    Paper tape is ok    Patient Measurements: Height: 5\' 9"  (175.3 cm) Weight: 203 lb 11.3 oz (92.4 kg) IBW/kg (Calculated) : 70.7  Vital Signs: Temp: 97.4 F (36.3 C) (05/28 0500) Temp Source: Oral (05/28 0500) Pulse Rate: 80 (05/27 2030)  Labs: Recent Labs    02/26/18 0315 02/27/18 0500 02/28/18 0606  HGB 8.3* 7.0* 8.3*  HCT 28.9* 24.9* 29.5*  PLT 178 156 208  LABPROT 25.5* 23.1* 24.8*  INR 2.35 2.07 2.27  CREATININE 1.45* 1.17 1.25*    Estimated Creatinine Clearance: 69.7 mL/min (A) (by C-G formula based on SCr of 1.25 mg/dL (H)).   Medical History: Past Medical History:  Diagnosis Date  . AICD (automatic cardioverter/defibrillator) present   . Anxiety   . ASCVD (arteriosclerotic cardiovascular disease)   . Chronic systolic CHF (congestive heart failure) ([place])   . COPD (chronic obstructive pulmonary disease) ([place])   . Coronary artery disease   . Depression   . GI bleed   . High cholesterol   . History of blood transfusion   . History of cocaine abuse   . Hypertension   . LVAD (left ventricular assist device) present ([place])   . Presence of permanent cardiac pacemaker   . Shortness of breath dyspnea   . Suicidal ideation   . Tobacco abuse   . Type II diabetes mellitus (HCC)      Assessment: 45 yoM s/p HM3 LVAD on warfarin PTA. INR therapeutic at 2.27, LDH stable, Hgb back up to 8.3.  PTA Dose = 5mg  Mon/Fri, 7.5mg  all other days  Goal of Therapy:  INR 1.8-2.4 Monitor platelets by anticoagulation protocol: Yes   Plan:  -Warfarin 7.5mg  PO x1  tonight -Daily protime  Arrie Senate, PharmD, BCPS PGY-2 Cardiology Pharmacy Resident Pager: [phone] 02/28/2018

## 2018-02-28 NOTE — Progress Notes (Signed)
LVAD Coordinator Rounding Note:  Admitted 02/23/18 to Dr. Haroldine Laws due to ascites and CHF with fluid overload.   HM III LVAD implanted on 09/09/16 by Dr. Cyndia Bent under Destination Therapy criteria.  Pt sitting on the edge of the bed this morning.  Vital signs: Temp:  98.4 HR: 80 Doppler Pressure: 80 Automatic BP: 95/78 (82) O2 Sat: 90 % on RA Wt: 222>203lbs  LVAD interrogation reveals:  Speed: 5800 Flow: 5.4 Power: 4.5 w PI:  2.6 Alarms: none Events: rare PI events Hematocrit:  31 Fixed speed:  5800 Low speed limit: 5500  Primary Controller: Replace back up battery in29months. Back up controller: Replace back up battery in62months.  Drive Line: left abdominal sorbaview dressing dry and intact; anchor intact and accurately applied. Change weekly; due Thur May 30th. Driveline anchored and secured. Pt has an intolerance to CHG please use saline and betadine only.  Labs:  LDH trend: 156>128  INR trend: 2.61>2.27  Gtts: Dobutamine 7.5 mcg/kg/min Lasix 20 mg/hr  Anticoagulation Plan: -INR Goal:  1.8 - 2.3 -ASA Dose: no ASA (history of GI bleeding)  Device: -Protect; end of service 08/2016 -Therapies:  Off Last check: complete interrogation 05/26/2017. DDD (lower rate 60); BiV pacing with 96% AS-VP  Adverse Events on VAD: 11/30/16> Milrinone gtt at discharge 01/27/17>dobutamine at 5 mcg/kg/mingtt at discharge 03/08/17> RV failure 04/29/17> symptomatic anemia and melena. EGD revealed stomach ulcers. Carafate added; ASA stopped 05/31/17>Ongoing symptomatic anemia and melena; capsule study mild antral gastritis; poss tiny AVM right colon. Lower MAPS, Losartan cut back. 06/2017> admit with syncope, AMS, Dobutamine at 7.68mcg 11/09/17>Marked ascites, RV failure, and hepatic encephalopathy. Paracentesis on 2/7 and 2/12 with >11 liters fluid removed 12/27/17>Outpatient paracentesis with 2.8 liters removed 01/11/18>IP paracentesis with 4.8 liters removed 01/16/18>IP paracentesis  w/2.4L removed 02/15/18>OP paracentesis w/ 5 L removed 02/23/18>IP paracentesis w/ 5.6 L removed   Plan/Recommendations:  1. Will need weekly dressing changes; due next Thur May 30th. Bedside RN may change dressing w/above instructions. 2. Planning for pt to d/c to [facility] in the next few days for long term care.  3. Please call VAD pager with any VAD equipment or drive line issues.  Tanda Rockers RN, VAD Coordiantor 24/7 VAD pager: [phone]

## 2018-02-28 NOTE — Consult Note (Signed)
[place] Nurse wound consult note Reason for Consult: Consult requested for right chest full thickness chronic wound.  Pt states it has been present a year and is of unknown etiology Wound type: Full thickness circular, 4X5X.1cm Wound bed: Red and moist Drainage (amount, consistency, odor)  Small amt pink drainage, no odor Periwound: intact skin surrounding Dressing procedure/placement/frequency:  Pt has used antibiotic ointment on the past and it did not promote healing.Foam dressing to protect and promote healing.   Please re-consult if further assistance is needed.  Thank-you,  Julien Girt MSN, [place], [place], [place], [place]

## 2018-02-28 NOTE — Progress Notes (Addendum)
Advanced Heart Failure VAD Team Note  PCP-Cardiologist: Glori Bickers, MD   Subjective:    Remains on dobutamine 7.5 mcg/kg/min. Lasix gtt now at 20.  Brisk diuresis with -4 L with lasix drip, diamox, and metolazone. Weight down 5 lbs. Co-ox 54%. Creatinine stable 1.25. CVP ~14  No CP, SOB, or orthopnea. Drowsy this morning. But oriented and follows commands. No asterixis.  S/P Paracentesis 5/23 -->5.6 liters removed.   LVAD INTERROGATION:  HeartMate III LVAD:   Flow 5.5 liters/min, speed 5800, power 4.5, PI 2.5. No PI events. Personally reviewed.    Objective:    Vital Signs:   Temp:  [97.4 F (36.3 C)-98.4 F (36.9 C)] 97.4 F (36.3 C) (05/28 0500) Pulse Rate:  [79-80] 80 (05/27 2030) Resp:  [13-24] 24 (05/28 0500) BP: (81-127)/(61-79) 127/79 (05/27 1541) SpO2:  [92 %-94 %] 94 % (05/27 1541) Weight:  [203 lb 11.3 oz (92.4 kg)] 203 lb 11.3 oz (92.4 kg) (05/28 0500) Last BM Date: 02/27/18 Mean arterial Pressure 70s Intake/Output:   Intake/Output Summary (Last 24 hours) at 02/28/2018 0728 Last data filed at 02/28/2018 0616 Gross per 24 hour  Intake 2081.76 ml  Output 6100 ml  Net -4018.24 ml     Physical Exam   CVP 14 GENERAL: Drowsy. But awakens and follows commands NAD.  HEENT: Normal. anicteric NECK: Supple, JVP 14 cm. Carotids OK.  CARDIAC:  Mechanical heart sounds with LVAD hum present.  LUNGS:  CTAB, normal effort. No wheeze ABDOMEN:  NT, ND, no HSM. + distension, soft. No bruits or masses. +BS  LVAD exit site: No erythema or drainage. Stabilization device present and accurately applied. Driveline dressing changed daily per sterile technique. EXTREMITIES:  Warm and dry. No cyanosis, clubbing, rash, or edema. 1-2+ edema Neuro: drowsy but awakens and is oriented, cranial nerves grossly intact. moves all 4 extremities w/o difficulty. Affect pleasant   Telemetry   AV paced 80. Personally reviewed.   EKG    n/a  Labs   Basic Metabolic  Panel: Recent Labs  Lab 02/24/18 0450 02/25/18 0500 02/26/18 0315 02/27/18 0500 02/28/18 0606  NA 130* 130* 128* 132* 131*  K 4.5 3.7 4.0 3.4* 3.3*  CL 90* 90* 88* 89* 86*  CO2 29 31 31  35* 33*  GLUCOSE 176* 156* 161* 145* 175*  BUN 30* 26* 22* 20 21*  CREATININE 1.55* 1.28* 1.45* 1.17 1.25*  CALCIUM 8.0* 8.2* 8.2* 8.5* 8.6*  MG 2.1 1.9 1.9 2.0 2.2    Liver Function Tests: Recent Labs  Lab 02/23/18 1022  AST 25  ALT 17  ALKPHOS 94  BILITOT 1.0  PROT 6.7  ALBUMIN 3.0*   No results for input(s): LIPASE, AMYLASE in the last 168 hours. Recent Labs  Lab 02/23/18 1022 02/27/18 0500  AMMONIA 30 28    CBC: Recent Labs  Lab 02/24/18 0450 02/25/18 0500 02/26/18 0315 02/27/18 0500 02/28/18 0606  WBC 7.4 6.5 6.6 5.6 6.9  HGB 8.1* 8.1* 8.3* 7.0* 8.3*  HCT 27.9* 27.9* 28.9* 24.9* 29.5*  MCV 78.2 76.4* 77.7* 78.1 77.4*  PLT 159 148* 178 156 208    INR: Recent Labs  Lab 02/24/18 0450 02/25/18 0500 02/26/18 0315 02/27/18 0500 02/28/18 0606  INR 2.61 2.47 2.35 2.07 2.27    Other results:     Imaging   No results found.   Medications:     Scheduled Medications: . acetaZOLAMIDE  500 mg Oral Q12H  . amiodarone  200 mg Oral Daily  . citalopram  20  mg Oral Daily  . digoxin  0.125 mg Oral Daily  . gabapentin  300 mg Oral BID  . insulin aspart  0-9 Units Subcutaneous TID WC  . lactulose  45 g Oral TID  . magnesium oxide  400 mg Oral Daily  . metolazone  2.5 mg Oral Daily  . midodrine  10 mg Oral TID WC  . multivitamin with minerals  1 tablet Oral Daily  . pantoprazole  40 mg Oral Daily  . potassium chloride SA  40 mEq Oral BID  . spironolactone  25 mg Oral Daily  . sucralfate  1 g Oral TID WC & HS  . Warfarin - Pharmacist Dosing Inpatient   Does not apply q1800    Infusions: . DOBUTamine 7.5 mcg/kg/min (02/28/18 0616)  . furosemide (LASIX) infusion 20 mg/hr (02/28/18 0616)    PRN Medications: acetaminophen, cyclobenzaprine, docusate sodium,  ondansetron, senna-docusate, traZODone   Patient Profile  NISSIM FLEISCHER Jr.is a 62 y.o.male with history of chronic systolic HF s/p Medtronic ICD 2014, CAD s/p CABG in 2000, HTN, Hx of cocaine abuse, Tobacco abuse, Depression, PAF, and COPD. HM3 placed 09/09/2016. In April 2018 he was placed on milrinone but later switched to dobutamine. He remains on dobutamine 7.65mcg  Admitting today with marked volume overload.    Assessment/Plan:     1.Acute/Chronic end-stage biventricular systolic RJ:JOAC 16% due to ICM ->s/p Echo 08/24/16 LVEF 15%, RV mild dilated, moderately reduced. -->S/P HM3 LVAD 09/09/2016. Has been turned down for [facility] at [facility].  Marked volume overload. He was discharged the end of April. Weight was 189 pounds,  - Remains on dobutamine 7.5 mcg and midodrine. CO-OX 54%. Continue dobutamine  - Continue midodrine 10 TID for low MAPs and cirrhotic physiology.MAPs 70s. - Remains volume remains overloaded. Weight coming down. Continue current regimen. May need another paracentesis this week. - Off sildenafil due to hypotension - No beta blocker with RV failure.  - VAD interrogated personally. Parameters stable.  - Warfarin with goal INR 2.0 - 2.5 + ASA 81 daily.INR 2.27  Discussed dosing with PharmD personally. -LDH 128 - VAD interrogated personally. Parameters stable. - Not transplant candidate due to social issues, noncompliance and cirrhosis (has been turned down by [facility]) - Continue digoxin. Check dig level tomorrow.  2. CAD: Severe 3v- CAD s/p CABG with occluded grafts as above except for LIMA.  - No s/s ischemia Continue current therapy.  3. DMII:  - Continue insulin regimen. Continue SSI 4. Tobacco abuse:  - Reports complete cessation. No change 5. Atrial fibrillation, paroxysmal - Device interrogated with Dr. Lovena Le on 5/26. Device at EOL  (will need to follow closely. Not good candidate for gen change but may not have a choice) - Rhythm very slow sinus  brady with ventricular escape.  - Reprogrammed to AV pace. Looks good today  - Continue amio 200 daily. TSH elevated. Will add on free T4 and T3 for tomorrow am. 6. Anxiety/Depression - Continue Celexa 20mg  daily . - Palliative Care consult. Open to long term placement at [facility].  7. Cirrhosis with hepatic encephalopathy - likely combination ETOH and chronic RV failure -No asterictixis. -Ammonia level 30 on admit. Continue lactulose at 45 tid. Recheck today is 28 -S/P Paracentesis 5/23 with 5.6 liters removed.  8. Anemia, iron-deficiency:  - Has had GI work up with EGD and Capsule Endoscopy showing severe gastropath No evidence of active bleeding. Continue carafate. - Hgb 8.3 today. No s/s bleeding.  9. GOC: Palliative Care consulted. Plans  for long term care at [facility].  10. Hypokalemia - Increase supp.   He has no insight into his disease and no ability for self care. Will need long-term residence at [place] at d/c. He says it will be cheaper for him if he returns by 6/1. Will try to arrange. He is DNR/DNI.   I reviewed the LVAD parameters from today, and compared the results to the patient's prior recorded data.  No programming changes were made.  The LVAD is functioning within specified parameters.  The patient performs LVAD self-test daily.  LVAD interrogation was negative for any significant power changes, alarms or PI events/speed drops.  LVAD equipment check completed and is in good working order.  Back-up equipment present.   LVAD education done on emergency procedures and precautions and reviewed exit site care.  Length of Stay: [place], NP 02/28/2018, 7:28 AM  VAD Team --- VAD ISSUES ONLY--- Pager [phone] (7am - 7am)  Advanced Heart Failure Team  Pager [phone] (M-F; 7a - 4p)  Please contact [facility] for night-coverage after hours (4p -7a ) and weekends on [email or web address]   Patient seen and examined with the above-signed Advanced Practice  Provider and/or Housestaff. I personally reviewed laboratory data, imaging studies and relevant notes. I independently examined the patient and formulated the important aspects of the plan. I have edited the note to reflect any of my changes or salient points. I have personally discussed the plan with the patient and/or family.  He continues to diurese. Weight getting closer to baseline. He is 203 pounds today. Previous d/c last month was 189. I would like to see him get down to 195 or less. Continue diuresis. Continue dobutamine for RV support and midodrine for BP support. Can consider another paracentesis if needed. VAD interrogated personally. Parameters stable.  He will go to [facility] for long-term care on d/c.   Glori Bickers, MD  8:14 AM

## 2018-02-28 NOTE — Progress Notes (Signed)
Physical Therapy Treatment Patient Details Name: Ronald Miller. MRN: [id number] DOB: 12-13-1955 Today's Date: 02/28/2018    History of Present Illness Pt is a 62 y.o. male with PMH including LVAD implant (09/2016), CHF, CAD, HTN, depression, COPD, admitted 02/23/18 with marked abdominal bloating; worked up for acute on chronic end-stage biventricular systolic HF. Of note, multiple recent admission with recent d/c home from [facility].    PT Comments    Pt pleasant, sitting in chair after bath today.On arrival controller and power cord twisted into a knot with pt stating he doesn't know how to untangle it and could not state items he needed for power transition. Pt assisted with transition to batteries to untangle lines and had to be directed how to place batteries in pouch. With transition back to main power pt able to perform pt able to perform with mod cues but no physical assistance. Pt encouraged to continue to mobilize with nursing and needs 24hr assist to manage VAD equipment and safety. Pt in much better spirits then last admission.   HR 80 with VSS throughout   Follow Up Recommendations  SNF;Supervision/Assistance - 24 hour     Equipment Recommendations  None recommended by PT    Recommendations for Other Services       Precautions / Restrictions Precautions Precautions: Fall Precaution Comments: LVAD    Mobility  Bed Mobility               General bed mobility comments: Seated in recliner upon arrival  Transfers Overall transfer level: Needs assistance   Transfers: Sit to/from Stand Sit to Stand: Supervision         General transfer comment: supervision for lines and safety  Ambulation/Gait Ambulation/Gait assistance: Min guard Ambulation Distance (Feet): 170 Feet Assistive device: Rolling walker (2 wheeled) Gait Pattern/deviations: Step-through pattern;Decreased stride length   Gait velocity interpretation: 1.31 - 2.62 ft/sec, indicative of  limited community ambulator General Gait Details: directional cues despite pt ability to recall room number. pt tolerated well but reported fatigue at the end of gait   Stairs             Wheelchair Mobility    Modified Rankin (Stroke Patients Only)       Balance Overall balance assessment: Needs assistance   Sitting balance-Leahy Scale: Good       Standing balance-Leahy Scale: Poor                              Cognition Arousal/Alertness: Awake/alert Behavior During Therapy: Flat affect Overall Cognitive Status: Impaired/Different from baseline Area of Impairment: Memory;Safety/judgement                   Current Attention Level: Selective Memory: Decreased short-term memory Following Commands: Follows one step commands consistently Safety/Judgement: Decreased awareness of deficits;Decreased awareness of safety   Problem Solving: Slow processing;Decreased initiation;Difficulty sequencing;Requires verbal cues General Comments: pt oriented to place, day and room. Unable to transition power sources without assist and mod cues      Exercises      General Comments        Pertinent Vitals/Pain Pain Assessment: No/denies pain    Home Living                      Prior Function            PT Goals (current goals can now be found in the  care plan section) Progress towards PT goals: Progressing toward goals    Frequency           PT Plan Current plan remains appropriate    Co-evaluation              AM-PAC PT "6 Clicks" Daily Activity  Outcome Measure  Difficulty turning over in bed (including adjusting bedclothes, sheets and blankets)?: A Little Difficulty moving from lying on back to sitting on the side of the bed? : A Little Difficulty sitting down on and standing up from a chair with arms (e.g., wheelchair, bedside commode, etc,.)?: A Little Help needed moving to and from a bed to chair (including a  wheelchair)?: A Little Help needed walking in hospital room?: A Little Help needed climbing 3-5 steps with a railing? : A Little 6 Click Score: 18    End of Session   Activity Tolerance: Patient tolerated treatment well Patient left: in chair;with call bell/phone within reach Nurse Communication: Mobility status PT Visit Diagnosis: Other abnormalities of gait and mobility (R26.89);Muscle weakness (generalized) (M62.81)     Time: 1025-1050 PT Time Calculation (min) (ACUTE ONLY): 25 min  Charges:  $Gait Training: 8-22 mins $Therapeutic Activity: 8-22 mins                    G Codes:       Elwyn Reach, PT [phone]    [place] 02/28/2018, 10:59 AM

## 2018-03-01 LAB — CBC
HEMATOCRIT: 30.3 % — AB (ref 39.0–52.0)
Hemoglobin: 8.7 g/dL — ABNORMAL LOW (ref 13.0–17.0)
MCH: 22.1 pg — AB (ref 26.0–34.0)
MCHC: 28.7 g/dL — ABNORMAL LOW (ref 30.0–36.0)
MCV: 77.1 fL — AB (ref 78.0–100.0)
PLATELETS: 209 10*3/uL (ref 150–400)
RBC: 3.93 MIL/uL — ABNORMAL LOW (ref 4.22–5.81)
RDW: 17.7 % — AB (ref 11.5–15.5)
WBC: 6.9 10*3/uL (ref 4.0–10.5)

## 2018-03-01 LAB — COOXEMETRY PANEL
Carboxyhemoglobin: 1.6 % — ABNORMAL HIGH (ref 0.5–1.5)
METHEMOGLOBIN: 1.6 % — AB (ref 0.0–1.5)
O2 SAT: 45.5 %
TOTAL HEMOGLOBIN: 8.7 g/dL — AB (ref 12.0–16.0)

## 2018-03-01 LAB — BASIC METABOLIC PANEL
Anion gap: 10 (ref 5–15)
BUN: 22 mg/dL — ABNORMAL HIGH (ref 6–20)
CHLORIDE: 87 mmol/L — AB (ref 101–111)
CO2: 35 mmol/L — AB (ref 22–32)
Calcium: 8.8 mg/dL — ABNORMAL LOW (ref 8.9–10.3)
Creatinine, Ser: 1.43 mg/dL — ABNORMAL HIGH (ref 0.61–1.24)
GFR calc Af Amer: 60 mL/min — ABNORMAL LOW (ref >60)
GFR calc non Af Amer: 51 mL/min — ABNORMAL LOW (ref >60)
Glucose, Bld: 157 mg/dL — ABNORMAL HIGH (ref 65–99)
POTASSIUM: 4 mmol/L (ref 3.5–5.1)
Sodium: 132 mmol/L — ABNORMAL LOW (ref 135–145)

## 2018-03-01 LAB — PROTIME-INR
INR: 2.15
PROTHROMBIN TIME: 23.9 s — AB (ref 11.4–15.2)

## 2018-03-01 LAB — GLUCOSE, CAPILLARY
GLUCOSE-CAPILLARY: 149 mg/dL — AB (ref 65–99)
GLUCOSE-CAPILLARY: 227 mg/dL — AB (ref 65–99)
Glucose-Capillary: 169 mg/dL — ABNORMAL HIGH (ref 65–99)
Glucose-Capillary: 189 mg/dL — ABNORMAL HIGH (ref 65–99)

## 2018-03-01 LAB — MAGNESIUM: Magnesium: 2.2 mg/dL (ref 1.7–2.4)

## 2018-03-01 LAB — DIGOXIN LEVEL: Digoxin Level: 0.8 ng/mL (ref 0.8–2.0)

## 2018-03-01 LAB — T4, FREE: Free T4: 0.5 ng/dL — ABNORMAL LOW (ref 0.82–1.77)

## 2018-03-01 LAB — LACTATE DEHYDROGENASE: LDH: 138 U/L (ref 98–192)

## 2018-03-01 MED ORDER — WARFARIN SODIUM 7.5 MG PO TABS
7.5000 mg | ORAL_TABLET | Freq: Once | ORAL | Status: AC
  Administered 2018-03-01: 7.5 mg via ORAL
  Filled 2018-03-01: qty 1

## 2018-03-01 MED ORDER — ADULT MULTIVITAMIN W/MINERALS CH
1.0000 | ORAL_TABLET | Freq: Every day | ORAL | Status: DC
  Administered 2018-03-02 – 2018-03-06 (×5): 1 via ORAL
  Filled 2018-03-01 (×5): qty 1

## 2018-03-01 MED ORDER — LEVOTHYROXINE SODIUM 50 MCG PO TABS
50.0000 ug | ORAL_TABLET | Freq: Every day | ORAL | Status: DC
  Administered 2018-03-02 – 2018-03-06 (×5): 50 ug via ORAL
  Filled 2018-03-01 (×5): qty 1

## 2018-03-01 NOTE — Plan of Care (Signed)
Pt aware of fluid restriction and need for strict in/our records.  Pt continues to show weight loss in response to therapy.  Safe environment maintained.

## 2018-03-01 NOTE — Progress Notes (Signed)
LVAD Coordinator Rounding Note:  Admitted 02/23/18 to Dr. Haroldine Laws due to ascites and CHF with fluid overload.   HM III LVAD implanted on 09/09/16 by Dr. Cyndia Bent under Destination Therapy criteria.  Pt in bed asleep.  Vital signs: Temp:  98.5 HR: 79 Doppler Pressure: 76 Automatic BP: 110/83 (93) O2 Sat: 97 % on RA Wt: 222>203>198lbs  LVAD interrogation reveals:  Speed: 5800 Flow: 5.5 Power: 4.5 w PI:  2.9 Alarms: none Events: rare PI events Hematocrit:  30 Fixed speed:  5800 Low speed limit: 5500  Primary Controller: Replace back up battery in45months. Back up controller: Replace back up battery in24months.  Drive Line: left abdominal sorbaview dressing dry and intact; anchor intact and accurately applied. Change weekly; due Thur May 30th. Driveline anchored and secured. Pt has an intolerance to CHG please use saline and betadine only.  Labs:  LDH trend: [phone]  INR trend: 2.61>2.27>2.15  Gtts: Dobutamine 7.5 mcg/kg/min Lasix 20 mg/hr  Anticoagulation Plan: -INR Goal:  1.8 - 2.3 -ASA Dose: no ASA (history of GI bleeding)  Device: -Protect; end of service 08/2016 -Therapies:  Off Last check: complete interrogation 05/26/2017. DDD (lower rate 60); BiV pacing with 96% AS-VP  Adverse Events on VAD: 11/30/16> Milrinone gtt at discharge 01/27/17>dobutamine at 5 mcg/kg/mingtt at discharge 03/08/17> RV failure 04/29/17> symptomatic anemia and melena. EGD revealed stomach ulcers. Carafate added; ASA stopped 05/31/17>Ongoing symptomatic anemia and melena; capsule study mild antral gastritis; poss tiny AVM right colon. Lower MAPS, Losartan cut back. 06/2017> admit with syncope, AMS, Dobutamine at 7.41mcg 11/09/17>Marked ascites, RV failure, and hepatic encephalopathy. Paracentesis on 2/7 and 2/12 with >11 liters fluid removed 12/27/17>Outpatient paracentesis with 2.8 liters removed 01/11/18>IP paracentesis with 4.8 liters removed 01/16/18>IP paracentesis w/2.4L  removed 02/15/18>OP paracentesis w/ 5 L removed 02/23/18>IP paracentesis w/ 5.6 L removed   Plan/Recommendations:  1. Will need weekly dressing changes; due next Thur May 30th. Bedside RN may change dressing w/above instructions. 2. Planning for pt to d/c to [facility] in the next few days for long term care.  3. Please call VAD pager with any VAD equipment or drive line issues.  Tanda Rockers RN, VAD Coordiantor 24/7 VAD pager: [phone]

## 2018-03-01 NOTE — Care Management Important Message (Signed)
Important Message  Patient Details  Name: Ronald Miller. MRN: [id number] Date of Birth: 31-May-1956   Medicare Important Message Given:  Yes    Iyad Deroo P Katina Remick 03/01/2018, 2:07 PM

## 2018-03-01 NOTE — Progress Notes (Addendum)
Advanced Heart Failure VAD Team Note  PCP-Cardiologist: Glori Bickers, MD   Subjective:    Remains on dobutamine 7.5 mcg/kg/min. Lasix gtt at 20.  Brisk diuresis with -7.2 L with lasix drip, diamox, and metolazone. Weight down another 5 lbs. Now down to 198.  Co-ox 46%. Creatinine trending up 1.25 > 1.43. CVP 12   WOC RN consulted yesterday for red sore on chest. Foam dressing applied. Wound culture pending.   No CP, SOB, orthopnea. Anxious to go back to [facility]  S/P Paracentesis 5/23 -->5.6 liters removed.   LVAD INTERROGATION:  HeartMate III LVAD:   Flow 5.4 liters/min, speed 5800, power 4.4, PI 2.8. 2 PI events.. Personally reviewed.    Objective:    Vital Signs:   Temp:  [97.3 F (36.3 C)-98.4 F (36.9 C)] 97.3 F (36.3 C) (05/29 0700) Pulse Rate:  [62-82] 82 (05/29 0700) Resp:  [15-19] 19 (05/29 0700) BP: (81-120)/(50-83) 110/83 (05/29 0700) SpO2:  [95 %-100 %] 97 % (05/29 0700) Weight:  [198 lb 6.6 oz (90 kg)-202 lb 13.2 oz (92 kg)] 198 lb 6.6 oz (90 kg) (05/29 0647) Last BM Date: 02/28/18 Mean arterial Pressure 70s Intake/Output:   Intake/Output Summary (Last 24 hours) at 03/01/2018 0744 Last data filed at 03/01/2018 0653 Gross per 24 hour  Intake 1144.73 ml  Output 8390 ml  Net -7245.27 ml     Physical Exam   CVP: 12  GENERAL: NAD. Drowsy, but answers questions appropriately.  HEENT: Normal. Anicteric  NECK: Supple, JVP to jaw Carotids OK.  CARDIAC:  Mechanical heart sounds with LVAD hum present.  LUNGS:  CTAB, normal effort.  ABDOMEN:  NT, ND, no HSM. No bruits or masses. +BS  LVAD exit site: Dressing dry and intact. No erythema or drainage. Stabilization device present and accurately applied. Driveline dressing changed daily per sterile technique. Extremities: no cyanosis, clubbing, rash, 1+ edema Neuro: alert & oriented x 3, cranial nerves grossly intact. moves all 4 extremities w/o difficulty. Affect pleasant    Telemetry   AV paced.  Personally reviewed.   EKG    No new tracings.   Labs   Basic Metabolic Panel: Recent Labs  Lab 02/25/18 0500 02/26/18 0315 02/27/18 0500 02/28/18 0606 03/01/18 0439  NA 130* 128* 132* 131* 132*  K 3.7 4.0 3.4* 3.3* 4.0  CL 90* 88* 89* 86* 87*  CO2 31 31 35* 33* 35*  GLUCOSE 156* 161* 145* 175* 157*  BUN 26* 22* 20 21* 22*  CREATININE 1.28* 1.45* 1.17 1.25* 1.43*  CALCIUM 8.2* 8.2* 8.5* 8.6* 8.8*  MG 1.9 1.9 2.0 2.2 2.2    Liver Function Tests: Recent Labs  Lab 02/23/18 1022  AST 25  ALT 17  ALKPHOS 94  BILITOT 1.0  PROT 6.7  ALBUMIN 3.0*   No results for input(s): LIPASE, AMYLASE in the last 168 hours. Recent Labs  Lab 02/23/18 1022 02/27/18 0500  AMMONIA 30 28    CBC: Recent Labs  Lab 02/25/18 0500 02/26/18 0315 02/27/18 0500 02/28/18 0606 03/01/18 0439  WBC 6.5 6.6 5.6 6.9 6.9  HGB 8.1* 8.3* 7.0* 8.3* 8.7*  HCT 27.9* 28.9* 24.9* 29.5* 30.3*  MCV 76.4* 77.7* 78.1 77.4* 77.1*  PLT 148* 178 156 208 209    INR: Recent Labs  Lab 02/25/18 0500 02/26/18 0315 02/27/18 0500 02/28/18 0606 03/01/18 0439  INR 2.47 2.35 2.07 2.27 2.15    Other results:     Imaging   No results found.   Medications:  Scheduled Medications: . acetaZOLAMIDE  500 mg Oral Q12H  . amiodarone  200 mg Oral Daily  . citalopram  20 mg Oral Daily  . digoxin  0.125 mg Oral Daily  . doxycycline  100 mg Oral Q12H  . gabapentin  300 mg Oral BID  . insulin aspart  0-9 Units Subcutaneous TID WC  . lactulose  45 g Oral TID  . magnesium oxide  400 mg Oral Daily  . metolazone  2.5 mg Oral Daily  . midodrine  10 mg Oral TID WC  . multivitamin with minerals  1 tablet Oral Daily  . pantoprazole  40 mg Oral Daily  . spironolactone  25 mg Oral Daily  . sucralfate  1 g Oral TID WC & HS  . Warfarin - Pharmacist Dosing Inpatient   Does not apply q1800    Infusions: . DOBUTamine 7.5 mcg/kg/min (02/28/18 1709)  . furosemide (LASIX) infusion 20 mg/hr (02/28/18 2235)     PRN Medications: acetaminophen, cyclobenzaprine, docusate sodium, ondansetron, senna-docusate, traZODone   Patient Profile  Ronald Milleris a 62 y.o.male with history of chronic systolic HF s/p Medtronic ICD 2014, CAD s/p CABG in 2000, HTN, Hx of cocaine abuse, Tobacco abuse, Depression, PAF, and COPD. HM3 placed 09/09/2016. In April 2018 he was placed on milrinone but later switched to dobutamine. He remains on dobutamine 7.68mcg  Admitted 02/23/18 with marked volume overload.    Assessment/Plan:     1.Acute/Chronic end-stage biventricular systolic GX:QJJH 41% due to ICM ->s/p Echo 08/24/16 LVEF 15%, RV mild dilated, moderately reduced. -->S/P HM3 LVAD 09/09/2016. Has been turned down for transplant at [facility].  - Volume status improving, but still elevated. He was discharged the end of April. Weight was 189 pounds. Goal weight 195 lbs. Weight coming down. Stop metolazone today.  - Continue dobutamine 7.5 mcg. CO-OX 45.5%.  - Continue midodrine 10 TID for low MAPs and cirrhotic physiology.MAPs 70s - Off sildenafil due to hypotension - No beta blocker with RV failure.  - VAD interrogated personally. Parameters stable.  - Warfarin with goal INR 2.0 - 2.5 + ASA 81 daily.INR 2.15.  Discussed dosing with PharmD personally. -LDH 138 - VAD interrogated personally. Parameters stable. - Not transplant candidate due to social issues, noncompliance and cirrhosis (has been turned down by [facility]) - Continue digoxin. Dig level 0.8 5/29 - Consider repeat paracentesis if needed.  2. CAD: Severe 3v- CAD s/p CABG with occluded grafts as above except for LIMA.  - No s/s ischemia 3. DMII:  - Continue insulin regimen. Continue SSI 4. Tobacco abuse:  - Reports complete cessation. No change.  5. Atrial fibrillation, paroxysmal - Device interrogated with Dr. Lovena Le on 5/26. Device at EOL  (will need to follow closely. Not good candidate for gen change but may not have a choice) - Rhythm very  slow sinus brady with ventricular escape.  - Reprogrammed to AV pace. AV pacing.  - Continue amio 200 daily. TSH elevated. T4 low. See below.  6. Anxiety/Depression - Continue Celexa 20mg  daily . - Palliative Care consult. Open to long term placement at [facility]. No change.  7. Cirrhosis with hepatic encephalopathy - likely combination ETOH and chronic RV failure -No asterictixis. -Ammonia level 30 on admit. Continue lactulose at 45 tid -S/P Paracentesis 5/23 with 5.6 liters removed.  8. Anemia, iron-deficiency:  - Has had GI work up with EGD and Capsule Endoscopy showing severe gastropath No evidence of active bleeding. Continue carafate. - Hgb 8.7 today. No s/s bleeding.  9. GOC: Palliative Care consulted. Plans for long term care at [facility]. Needs to leave by 5/31 10. Hypokalemia - K 4.0 this am. Resolved.  11. Hypothyroidism - Start on synthroid 50 mcg daily.   He has no insight into his disease and no ability for self care. Will need long-term residence at [place] at d/c. He says it will be cheaper for him if he returns by 6/1. Will try to arrange. He is DNR/DNI.   I reviewed the LVAD parameters from today, and compared the results to the patient's prior recorded data.  No programming changes were made.  The LVAD is functioning within specified parameters.  The patient performs LVAD self-test daily.  LVAD interrogation was negative for any significant power changes, alarms or PI events/speed drops.  LVAD equipment check completed and is in good working order.  Back-up equipment present.   LVAD education done on emergency procedures and precautions and reviewed exit site care.  Length of Stay: [place], NP 03/01/2018, 7:44 AM  VAD Team --- VAD ISSUES ONLY--- Pager [phone] (7am - 7am)  Advanced Heart Failure Team  Pager [phone] (M-F; 7a - 4p)  Please contact [facility] for night-coverage after hours (4p -7a ) and weekends on [email or web address]  Patient seen  and examined with the above-signed Advanced Practice Provider and/or Housestaff. I personally reviewed laboratory data, imaging studies and relevant notes. I independently examined the patient and formulated the important aspects of the plan. I have edited the note to reflect any of my changes or salient points. I have personally discussed the plan with the patient and/or family.  Remains on dobutamine for RV support. On midodrine for BP support. Volume status improving with IV lasix, metolazone and diamox. Still 5-10 pounds on board. Will continue IV lasix. Can stop metolazone as creatinine up slightly. WOC seeing for chest wound. VAD interrogated personally. Parameters stable.  Glori Bickers, MD  1:55 PM

## 2018-03-01 NOTE — Progress Notes (Signed)
ANTICOAGULATION CONSULT NOTE - [place] for warfarin Indication: LVAD  Allergies  Allergen Reactions  . Chlorhexidine Gluconate Itching and Rash  . Codeine Nausea And Vomiting and Other (See Comments)    In high doses  . Lipitor [Atorvastatin] Nausea Only and Other (See Comments)    Nausea with high doses, tolerates 20mg  dose (08/22/16)  . Tape Other (See Comments)    Paper tape is ok    Patient Measurements: Height: 5\' 9"  (175.3 cm) Weight: 198 lb 6.6 oz (90 kg)(Scale A) IBW/kg (Calculated) : 70.7  Vital Signs: Temp: 97.3 F (36.3 C) (05/29 0700) Temp Source: Oral (05/29 0700) BP: 110/83 (05/29 0700) Pulse Rate: 82 (05/29 0700)  Labs: Recent Labs    02/27/18 0500 02/28/18 0606 03/01/18 0439  HGB 7.0* 8.3* 8.7*  HCT 24.9* 29.5* 30.3*  PLT 156 208 209  LABPROT 23.1* 24.8* 23.9*  INR 2.07 2.27 2.15  CREATININE 1.17 1.25* 1.43*    Estimated Creatinine Clearance: 60.2 mL/min (A) (by C-G formula based on SCr of 1.43 mg/dL (H)).   Medical History: Past Medical History:  Diagnosis Date  . AICD (automatic cardioverter/defibrillator) present   . Anxiety   . ASCVD (arteriosclerotic cardiovascular disease)   . Chronic systolic CHF (congestive heart failure) ([place])   . COPD (chronic obstructive pulmonary disease) ([place])   . Coronary artery disease   . Depression   . GI bleed   . High cholesterol   . History of blood transfusion   . History of cocaine abuse   . Hypertension   . LVAD (left ventricular assist device) present ([place])   . Presence of permanent cardiac pacemaker   . Shortness of breath dyspnea   . Suicidal ideation   . Tobacco abuse   . Type II diabetes mellitus (HCC)      Assessment: 64 yoM s/p HM3 LVAD on warfarin PTA. INR therapeutic at 2.15, LDH stable, Hgb trending back up, no S/Sx bleeding. Note: levothyroxine starting tomorrow which may increase warfarin effects.   PTA Dose = 5mg  Mon/Fri, 7.5mg  all other days  Goal  of Therapy:  INR 1.8-2.4 Monitor platelets by anticoagulation protocol: Yes   Plan:  -Warfarin 7.5mg  PO x1 tonight -Daily protime  Arrie Senate, PharmD, BCPS PGY-2 Cardiology Pharmacy Resident Pager: [phone] 03/01/2018

## 2018-03-02 LAB — GLUCOSE, CAPILLARY
GLUCOSE-CAPILLARY: 129 mg/dL — AB (ref 65–99)
Glucose-Capillary: 189 mg/dL — ABNORMAL HIGH (ref 65–99)
Glucose-Capillary: 173 mg/dL — ABNORMAL HIGH (ref 65–99)
Glucose-Capillary: 208 mg/dL — ABNORMAL HIGH (ref 65–99)

## 2018-03-02 LAB — AEROBIC CULTURE W GRAM STAIN (SUPERFICIAL SPECIMEN)

## 2018-03-02 LAB — BASIC METABOLIC PANEL
Anion gap: 13 (ref 5–15)
BUN: 25 mg/dL — AB (ref 6–20)
CALCIUM: 8.8 mg/dL — AB (ref 8.9–10.3)
CO2: 34 mmol/L — ABNORMAL HIGH (ref 22–32)
CREATININE: 1.38 mg/dL — AB (ref 0.61–1.24)
Chloride: 84 mmol/L — ABNORMAL LOW (ref 101–111)
GFR calc Af Amer: >60 mL/min (ref >60)
GFR, EST NON AFRICAN AMERICAN: 54 mL/min — AB (ref >60)
Glucose, Bld: 169 mg/dL — ABNORMAL HIGH (ref 65–99)
Potassium: 2.9 mmol/L — ABNORMAL LOW (ref 3.5–5.1)
SODIUM: 131 mmol/L — AB (ref 135–145)

## 2018-03-02 LAB — CBC
HCT: 29.8 % — ABNORMAL LOW (ref 39.0–52.0)
Hemoglobin: 8.5 g/dL — ABNORMAL LOW (ref 13.0–17.0)
MCH: 21.9 pg — ABNORMAL LOW (ref 26.0–34.0)
MCHC: 28.5 g/dL — AB (ref 30.0–36.0)
MCV: 76.8 fL — ABNORMAL LOW (ref 78.0–100.0)
PLATELETS: 219 10*3/uL (ref 150–400)
RBC: 3.88 MIL/uL — ABNORMAL LOW (ref 4.22–5.81)
RDW: 17.6 % — AB (ref 11.5–15.5)
WBC: 6.5 10*3/uL (ref 4.0–10.5)

## 2018-03-02 LAB — MAGNESIUM: MAGNESIUM: 2.2 mg/dL (ref 1.7–2.4)

## 2018-03-02 LAB — PROTIME-INR
INR: 2.05
Prothrombin Time: 23 seconds — ABNORMAL HIGH (ref 11.4–15.2)

## 2018-03-02 LAB — COOXEMETRY PANEL
Carboxyhemoglobin: 2 % — ABNORMAL HIGH (ref 0.5–1.5)
Methemoglobin: 1.7 % — ABNORMAL HIGH (ref 0.0–1.5)
O2 SAT: 63.3 %
TOTAL HEMOGLOBIN: 8.6 g/dL — AB (ref 12.0–16.0)

## 2018-03-02 LAB — LACTATE DEHYDROGENASE: LDH: 125 U/L (ref 98–192)

## 2018-03-02 LAB — T3, FREE: T3, Free: 2 pg/mL (ref 2.0–4.4)

## 2018-03-02 MED ORDER — POTASSIUM CHLORIDE CRYS ER 20 MEQ PO TBCR
60.0000 meq | EXTENDED_RELEASE_TABLET | ORAL | Status: AC
  Administered 2018-03-02 (×2): 60 meq via ORAL
  Filled 2018-03-02 (×2): qty 3

## 2018-03-02 MED ORDER — WARFARIN SODIUM 7.5 MG PO TABS
7.5000 mg | ORAL_TABLET | Freq: Once | ORAL | Status: AC
  Administered 2018-03-02: 7.5 mg via ORAL
  Filled 2018-03-02: qty 1

## 2018-03-02 MED ORDER — DOBUTAMINE IN D5W 4-5 MG/ML-% IV SOLN
7.5000 ug/kg/min | INTRAVENOUS | Status: DC
  Administered 2018-03-02 – 2018-03-05 (×5): 7.5 ug/kg/min via INTRAVENOUS
  Filled 2018-03-02 (×6): qty 250

## 2018-03-02 NOTE — Plan of Care (Signed)
  Problem: Cardiac: Goal: LVAD will function as expected and patient will experience no clinical alarms Outcome: Progressing   Problem: Education: Goal: Patient will understand all VAD equipment and how it functions Outcome: Progressing Goal: Patient will be able to verbalize current INR target range and antiplatelet therapy for discharge home Outcome: Progressing   Problem: Health Behavior/Discharge Planning: Goal: Ability to manage health-related needs will improve Outcome: Progressing   Problem: Clinical Measurements: Goal: Cardiovascular complication will be avoided Outcome: Progressing   Problem: Safety: Goal: Ability to remain free from injury will improve Outcome: Progressing

## 2018-03-02 NOTE — Progress Notes (Addendum)
ANTICOAGULATION CONSULT NOTE - [place] for warfarin Indication: LVAD  Allergies  Allergen Reactions  . Chlorhexidine Gluconate Itching and Rash  . Codeine Nausea And Vomiting and Other (See Comments)    In high doses  . Lipitor [Atorvastatin] Nausea Only and Other (See Comments)    Nausea with high doses, tolerates 20mg  dose (08/22/16)  . Tape Other (See Comments)    Paper tape is ok    Patient Measurements: Height: 5\' 9"  (062.3 cm) Weight: 194 lb 7.1 oz (88.2 kg) IBW/kg (Calculated) : 70.7  Vital Signs: Temp: 97.6 F (36.4 C) (05/30 0739) Temp Source: Oral (05/30 0739) BP: 91/64 (05/30 0753) Pulse Rate: 51 (05/30 0408)  Labs: Recent Labs    02/28/18 0606 03/01/18 0439 03/02/18 0425  HGB 8.3* 8.7* 8.5*  HCT 29.5* 30.3* 29.8*  PLT 208 209 219  LABPROT 24.8* 23.9* 23.0*  INR 2.27 2.15 2.05  CREATININE 1.25* 1.43* 1.38*    Estimated Creatinine Clearance: 61.8 mL/min (A) (by C-G formula based on SCr of 1.38 mg/dL (H)).   Medical History: Past Medical History:  Diagnosis Date  . AICD (automatic cardioverter/defibrillator) present   . Anxiety   . ASCVD (arteriosclerotic cardiovascular disease)   . Chronic systolic CHF (congestive heart failure) ([place])   . COPD (chronic obstructive pulmonary disease) ([place])   . Coronary artery disease   . Depression   . GI bleed   . High cholesterol   . History of blood transfusion   . History of cocaine abuse   . Hypertension   . LVAD (left ventricular assist device) present ([place])   . Presence of permanent cardiac pacemaker   . Shortness of breath dyspnea   . Suicidal ideation   . Tobacco abuse   . Type II diabetes mellitus (HCC)      Assessment: 89 yoM s/p HM3 LVAD on warfarin PTA. INR therapeutic at 2.05, LDH stable, Hgb stable, no S/Sx bleeding. Note: levothyroxine starting which may increase warfarin effects and doxycycline will be added for 7 days.  If discharged, recommend warfarin  7.5mg  daily except 5mg  Mon/Fri with INR F/U by end of next week (Fri 6/7).  PTA Dose = 5mg  Mon/Fri, 7.5mg  all other days  Goal of Therapy:  INR 1.8-2.4 Monitor platelets by anticoagulation protocol: Yes   Plan:  -Warfarin 7.5mg  PO x1 tonight if not discharged -Daily protime  Arrie Senate, PharmD, BCPS PGY-2 Cardiology Pharmacy Resident Pager: [phone] 03/02/2018

## 2018-03-02 NOTE — Progress Notes (Addendum)
Advanced Heart Failure VAD Team Note  PCP-Cardiologist: Glori Bickers, MD   Subjective:    Remains on dobutamine 7.5 mcg/kg/min and Lasix gtt at 20.  Brisk diuresis on lasix drip + diamox. Weight down another 4 lbs to 194 lbs.  Co-ox 63%. Creatinine 1.38. K 2.9. CVP 12-13  Wound culture from chest shows moderate staph aureus, susceptibilities pending  Denies CP, SOB, orthopnea.  Bloating improved. No fevers or chills. Walked in hallways yesterday. Denies dizziness.   S/P Paracentesis 5/23 -->5.6 liters removed.   LVAD INTERROGATION:  HeartMate III LVAD:   Flow 5.4 liters/min, speed 5800, power 4.6, PI 2.6. 9 PI events/24 hrs. Personally reviewed.    Objective:    Vital Signs:   Temp:  [97.7 F (36.5 C)-98.6 F (37 C)] 98.6 F (37 C) (05/30 0408) Pulse Rate:  [51-104] 51 (05/30 0408) Resp:  [16-20] 18 (05/30 0419) BP: (64-100)/(41-85) 84/72 (05/30 0419) SpO2:  [86 %-99 %] 99 % (05/30 0408) Weight:  [194 lb 7.1 oz (88.2 kg)] 194 lb 7.1 oz (88.2 kg) (05/30 0408) Last BM Date: 03/01/18 Mean arterial Pressure 70s Intake/Output:   Intake/Output Summary (Last 24 hours) at 03/02/2018 0709 Last data filed at 03/02/2018 0417 Gross per 24 hour  Intake 2842.19 ml  Output 3725 ml  Net -882.81 ml     Physical Exam   CVP: 12-13 GENERAL: Drowsy. NAD.  HEENT: Normal. Anicteric NECK: Supple, JVP to jaw. Carotids OK.  CARDIAC:  Mechanical heart sounds with LVAD hum present.  LUNGS:  CTAB, normal effort.  No wheeze ABDOMEN:  NT, minimally distended, no HSM. No bruits or masses. +BS  LVAD exit site: Dressing dry and intact. No erythema or drainage. Stabilization device present and accurately applied. Driveline dressing changed daily per sterile technique. Extremities: no cyanosis, clubbing, rash, trace edema Neuro: alert & oriented x 3, cranial nerves grossly intact. moves all 4 extremities w/o difficulty. Affect pleasant   Telemetry   AV paced 80s. Personally  reviewed.  EKG    No new tracings.   Labs   Basic Metabolic Panel: Recent Labs  Lab 02/26/18 0315 02/27/18 0500 02/28/18 0606 03/01/18 0439 03/02/18 0425  NA 128* 132* 131* 132* 131*  K 4.0 3.4* 3.3* 4.0 2.9*  CL 88* 89* 86* 87* 84*  CO2 31 35* 33* 35* 34*  GLUCOSE 161* 145* 175* 157* 169*  BUN 22* 20 21* 22* 25*  CREATININE 1.45* 1.17 1.25* 1.43* 1.38*  CALCIUM 8.2* 8.5* 8.6* 8.8* 8.8*  MG 1.9 2.0 2.2 2.2 2.2    Liver Function Tests: Recent Labs  Lab 02/23/18 1022  AST 25  ALT 17  ALKPHOS 94  BILITOT 1.0  PROT 6.7  ALBUMIN 3.0*   No results for input(s): LIPASE, AMYLASE in the last 168 hours. Recent Labs  Lab 02/23/18 1022 02/27/18 0500  AMMONIA 30 28    CBC: Recent Labs  Lab 02/26/18 0315 02/27/18 0500 02/28/18 0606 03/01/18 0439 03/02/18 0425  WBC 6.6 5.6 6.9 6.9 6.5  HGB 8.3* 7.0* 8.3* 8.7* 8.5*  HCT 28.9* 24.9* 29.5* 30.3* 29.8*  MCV 77.7* 78.1 77.4* 77.1* 76.8*  PLT 178 156 208 209 219    INR: Recent Labs  Lab 02/26/18 0315 02/27/18 0500 02/28/18 0606 03/01/18 0439 03/02/18 0425  INR 2.35 2.07 2.27 2.15 2.05    Other results:     Imaging   No results found.   Medications:     Scheduled Medications: . acetaZOLAMIDE  500 mg Oral Q12H  . amiodarone  200 mg Oral Daily  . citalopram  20 mg Oral Daily  . digoxin  0.125 mg Oral Daily  . doxycycline  100 mg Oral Q12H  . gabapentin  300 mg Oral BID  . insulin aspart  0-9 Units Subcutaneous TID WC  . lactulose  45 g Oral TID  . levothyroxine  50 mcg Oral QAC breakfast  . magnesium oxide  400 mg Oral Daily  . midodrine  10 mg Oral TID WC  . multivitamin with minerals  1 tablet Oral Daily  . pantoprazole  40 mg Oral Daily  . potassium chloride  60 mEq Oral Q4H  . spironolactone  25 mg Oral Daily  . sucralfate  1 g Oral TID WC & HS  . Warfarin - Pharmacist Dosing Inpatient   Does not apply q1800    Infusions: . DOBUTamine 7.5 mcg/kg/min (03/02/18 0417)  . furosemide  (LASIX) infusion 20 mg/hr (03/02/18 0417)    PRN Medications: acetaminophen, cyclobenzaprine, docusate sodium, ondansetron, senna-docusate, traZODone   Patient Profile  Ronald Milleris a 62 y.o.male with history of chronic systolic HF s/p Medtronic ICD 2014, CAD s/p CABG in 2000, HTN, Hx of cocaine abuse, Tobacco abuse, Depression, PAF, and COPD. HM3 placed 09/09/2016. In April 2018 he was placed on milrinone but later switched to dobutamine. He remains on dobutamine 7.81mcg  Admitted 02/23/18 with marked volume overload.    Assessment/Plan:     1.Acute/Chronic end-stage biventricular systolic FU:XNAT 55% due to ICM ->s/p Echo 08/24/16 LVEF 15%, RV mild dilated, moderately reduced. -->S/P HM3 LVAD 09/09/2016. Has been turned down for transplant at [facility].  - Volume status improving, but still elevated. He was discharged the end of April. Weight was 189 pounds. He is 194 lbs this morning. - Continue dobutamine 7.5 mcg. CO-OX 63%.  - Continue lasix drip at 20 mg/hr. CVP 12-13. Consider transitioning to torsemide today or tomorrow (on 100 mg BID at home) - Continue midodrine 10 TID for low MAPs and cirrhotic physiology.MAPs 70s - Off sildenafil due to hypotension - No beta blocker with RV failure.  - VAD interrogated personally. Parameters stable.  - Warfarin with goal INR 2.0 - 2.5 + ASA 81 daily.INR 2.05.  Discussed dosing with PharmD personally. -LDH 125 - Not transplant candidate due to social issues, noncompliance and cirrhosis (has been turned down by [facility]) - Continue digoxin. Dig level 0.8 5/29 - Consider repeat paracentesis if needed.  2. CAD: Severe 3v- CAD s/p CABG with occluded grafts as above except for LIMA.  - No s/s ischemia 3. DMII:  - Continue insulin regimen. Continue SSI 4. Tobacco abuse:  - Reports complete cessation. No change.  5. Atrial fibrillation, paroxysmal - Device interrogated with Dr. Lovena Le on 5/26. Device at EOL  (will need to follow closely.  Not good candidate for gen change but may not have a choice) - Rhythm very slow sinus brady with ventricular escape.  - Reprogrammed to AV pace. Continues to AV pace.  - Continue amio 200 daily.  6. Anxiety/Depression - Continue Celexa 20mg  daily . - Palliative Care consult. Open to long term placement at [facility]. No change.  7. Cirrhosis with hepatic encephalopathy - likely combination ETOH and chronic RV failure - No asterixis -Ammonia level 30 on admit. Continue lactulose at 45 tid -S/P Paracentesis 5/23 with 5.6 liters removed.  8. Anemia, iron-deficiency:  - Has had GI work up with EGD and Capsule Endoscopy showing severe gastropath No evidence of active bleeding. Continue carafate. - Hgb  8.5 today. No s/s bleeding.  9. GOC: Palliative Care consulted. Plans for long term care at [facility]. Needs to leave by 5/31. No change.  10. Hypokalemia - K 2.9 this am. Will supp.   11. Hypothyroidism - Continue synthroid 50 mcg daily.  12. Wound on chest - WOCN consulted. - Wound culture from chest shows moderate staph aureus. Continue doxy for now. Discussed with PharmD.   He has no insight into his disease and no ability for self care. Will need long-term residence at [place] at d/c. He says it will be cheaper for him if he returns by 6/1. Will try to arrange. He is DNR/DNI.   I reviewed the LVAD parameters from today, and compared the results to the patient's prior recorded data.  No programming changes were made.  The LVAD is functioning within specified parameters.  The patient performs LVAD self-test daily.  LVAD interrogation was negative for any significant power changes, alarms or PI events/speed drops.  LVAD equipment check completed and is in good working order.  Back-up equipment present.   LVAD education done on emergency procedures and precautions and reviewed exit site care.  Anticipate hopeful discharge tomorrow. Spoke to CM. No bed yet available at [facility], but  she will discuss with CSW today.  Length of Stay: [place], NP 03/02/2018, 7:09 AM  VAD Team --- VAD ISSUES ONLY--- Pager [phone] (7am - 7am)  Advanced Heart Failure Team  Pager [phone] (M-F; 7a - 4p)  Please contact [facility] for night-coverage after hours (4p -7a ) and weekends on [email or web address]  Remains on dobutamine for RV support and midodrine support. Co-ox stable. Volume status still up but improving on high-dose lasix gtt. Chest wound culture with staph. Continue doxy. VAD interrogated personally. Parameters stable. INR 2.07. Discussed dosing with PharmD personally. K low. Has been supped. Will recheck.   Glori Bickers, MD  8:21 PM

## 2018-03-02 NOTE — Progress Notes (Signed)
Clinical Social Worker following patient for support and discharge needs. Patient and sister both agreeable for patient to return to [facility]. Sister contacted CSW via phone and stated she was at facility filling out paper work for patients return back to [facility]. CSW to start authorization through patients  Insurance.   Rhea Pink, MSW,  [place]

## 2018-03-02 NOTE — Discharge Summary (Addendum)
Advanced Heart Failure Team  Discharge Summary   Patient ID: Ronald Miller. MRN: [id number], DOB/AGE: 10/11/1955 62 y.o. Admit date: 02/23/2018 D/C date:     03/06/2018   Primary Discharge Diagnoses:  1. A/C end stage biventricular systolic HF - On dobutamine 7.5 mcg/kg/min, managed by [facility] - S/p HM3 LVAD 09/09/2016 - Coumadin with INR goal 1.8-2.3. Not on ASA with hx of GIB  Secondary Discharge Diagnoses:  1. CAD s/p CABG 2000 2. DM 3. Tobacco use 4. PAF 5. Anxiety/depression 6. Cirrhosis with hepatic encephalopathy 7. Anemia 8. Hypokalemia 9. Hypothyroidism 10. Wound on chest - On doxy 100 mg BID through 03/07/18  Hospital Course: Ronald Miller. is a 62 y.o. male with history of chronic systolic HF s/p Medtronic ICD 2014, CAD s/p CABG in 2000, HTN, Hx of cocaine abuse, Tobacco abuse, Depression, PAF, and COPD. HM3 placed 09/09/2016. In April 2018 he was placed on milrinone but later switched to dobutamine. He remains on dobutamine 7.5 mcg.  [facility]18 with volume overload and 40 lb weight gain. He had been discharged from SNF to sister's home on 5/18. He had been drinking a lot of fluid. He initially had poor response to IV Lasix. He required lasix drip + diamox + metolazone for diuresis. He transitioned to home diuretics of 100 mg torsemide BID + metolazone 2x/week. His home dobutamine was continued at 7.5 mcg/kg/min. Midodrine was continued for BP support. Kidney function was monitored with peak on day of admission of 1.63, which with diuresis. He diuresed 30 lbs.   VAD parameters remained stable. LDH was stable throughout admission. INR was monitored and coumadin adjusted by pharmacy. He had no evidence of bleeding and CBC remained at baseline. He will have PT/INR recheck on Monday at VAD follow up.   He underwent paracentesis on 5/23 with 5.6 L removed. Lactulose was continued. Ammonia levels monitored and remained normal.  He had pacer spikes in  the middle of his QRS, so device was reprogrammed by Dr Lovena Le. He then AV paced appropriately. His ICD was noted to be at EOL, which will need to be followed closely. He is not a good candidate for generator change, but may not have a choice.   Palliative care was consulted. Pt and family decided that pt should be DNR/DNI. They agreed that the best and safest option for patient would be long term placement at [facility] at discharge. MOST form completed prior to discharge.   Additionally, he had an open wound on his chest. WOCN consulted and wound culture was sent. He was started on doxycycline, which he will continue until 03/07/18.  He was also started on levothyroxine for elevated TSH and low free T4. This will need to be followed outpatient with repeat TFT's in 6 weeks.   He received 1 unit PRBCs on 03/05/18 for hemoglobin 7.6. He had no overt bleeding. Hemoglobin 8.1 on day of discharge. Will check CBC at follow up.  He was held over weekend with problems with insurance approval for SNF. Attempted call for peer to peer review on 03/03/18 at 5:30 pm, but no answer. Dr Haroldine Laws called and left a message this morning and he is now approved.   He will be discharged to [facility] SNF. CSW will arrange transportation. He will have close follow up in VAD clinic, as below. PT/INR, BMET, CBC, and LDH will be checked at follow up. [facility] will continue to follow for dobutamine.   VAD events:  RV Failure 01/2017 On milrinone and transitioned to dobutamine   GI Events 04/2017 - EGD with gastritis.  06/01/2017- Capsule Endoscopy- normal except tiny angiectasia, nonbleeding in the right colon. 4/10 and 01/16/2018 Paracentesis  02/23/18: Paracentesis  LVAD Interrogation HM II:   Speed: 5800     Flow: 5.5     PI:   2.9   Power:  5     Back-up speed: 5500   1 PI event/24 hours.   Discharge Weight: 190 lbs Discharge Vitals: Blood pressure (!) 79/67, pulse 74, temperature 98.6 F (37 C), temperature source  Oral, resp. rate 19, height 5\' 9"  (1.753 m), weight 196 lb 3.4 oz (89 kg), SpO2 95 %.  Labs: Lab Results  Component Value Date   WBC 7.0 03/06/2018   HGB 8.1 (L) 03/06/2018   HCT 28.1 (L) 03/06/2018   MCV 77.8 (L) 03/06/2018   PLT 194 03/06/2018    Recent Labs  Lab 03/06/18 0500  NA 130*  K 3.5  CL 96*  CO2 29  BUN 33*  CREATININE 1.26*  CALCIUM 8.0*  GLUCOSE 178*   Lab Results  Component Value Date   CHOL 153 08/15/2013   HDL 37 (L) 08/15/2013   LDLCALC 89 08/15/2013   TRIG 133 08/15/2013   BNP (last 3 results) No results for input(s): BNP in the last 8760 hours.  ProBNP (last 3 results) No results for input(s): PROBNP in the last 8760 hours.   Diagnostic Studies/Procedures   No results found.  Discharge Medications   Allergies as of 03/06/2018      Reactions   Chlorhexidine Gluconate Itching, Rash   Codeine Nausea And Vomiting, Other (See Comments)   In high doses   Lipitor [atorvastatin] Nausea Only, Other (See Comments)   Nausea with high doses, tolerates 20mg  dose (08/22/16)   Tape Other (See Comments)   Paper tape is ok      Medication List    TAKE these medications   acetaminophen 500 MG tablet Commonly known as:  TYLENOL Take 1,000 mg by mouth every 6 (six) hours as needed for moderate pain or headache.   amiodarone 200 MG tablet Commonly known as:  PACERONE TAKE ONE TABLET BY MOUTH ONCE DAILY   citalopram 20 MG tablet Commonly known as:  CELEXA Take 1 tablet (20 mg total) by mouth daily.   cyclobenzaprine 10 MG tablet Commonly known as:  FLEXERIL Take 1 tablet (10 mg total) by mouth 3 (three) times daily as needed for muscle spasms.   digoxin 0.125 MG tablet Commonly known as:  DIGOX Take 1 tablet (0.125 mg total) daily by mouth.   DOBUTamine 4-5 MG/ML-% infusion Commonly known as:  DOBUTREX Inject 714.75 mcg/min into the vein continuous. PER [facility] pharmacy   docusate sodium 100 MG capsule Commonly known as:  COLACE Take 200 mg  by mouth at bedtime as needed for mild constipation.   doxycycline 100 MG tablet Commonly known as:  VIBRA-TABS Take 1 tablet (100 mg total) by mouth every 12 (twelve) hours for 5 days.   gabapentin 300 MG capsule Commonly known as:  NEURONTIN Take 1 capsule (300 mg total) by mouth 2 (two) times daily.   insulin aspart 100 UNIT/ML FlexPen Commonly known as:  NOVOLOG FLEXPEN Inject 5 Units into the skin 3 (three) times daily with meals.   Insulin Glargine 100 UNIT/ML Solostar Pen Commonly known as:  LANTUS SOLOSTAR Inject 10 Units into the skin daily at 10 pm.   lactulose 10 GM/15ML solution  Commonly known as:  CHRONULAC Take 67.5 mLs (45 g total) by mouth 3 (three) times daily.   levothyroxine 50 MCG tablet Commonly known as:  SYNTHROID, LEVOTHROID Take 1 tablet (50 mcg total) by mouth daily before breakfast.   magnesium oxide 400 (241.3 Mg) MG tablet Commonly known as:  MAG-OX Take 1 tablet (400 mg total) by mouth daily.   metolazone 2.5 MG tablet Commonly known as:  ZAROXOLYN Take 2.5 mg by mouth See admin instructions. Take one tablet (2.5mg ) by mouth on Fridays and Mondays.   midodrine 10 MG tablet Commonly known as:  PROAMATINE Take 1 tablet (10 mg total) by mouth 3 (three) times daily with meals.   multivitamin with minerals Tabs tablet Take 1 tablet by mouth daily.   ondansetron 4 MG disintegrating tablet Commonly known as:  ZOFRAN ODT Take 1 tablet (4 mg total) by mouth every 8 (eight) hours as needed for nausea.   pantoprazole 40 MG tablet Commonly known as:  PROTONIX TAKE 1 TABLET BY MOUTH TWICE DAILY WITH A MEAL   potassium chloride SA 20 MEQ tablet Commonly known as:  K-DUR,KLOR-CON Take 2 tablets (40 mEq total) by mouth 2 (two) times daily. Take 40 meq in am and 20 meq in pm   senna-docusate 8.6-50 MG tablet Commonly known as:  SENOKOT S Take 2 tablets by mouth at bedtime as needed for mild constipation.   spironolactone 25 MG tablet Commonly  known as:  ALDACTONE Take 1 tablet (25 mg total) by mouth daily.   sucralfate 1 GM/10ML suspension Commonly known as:  CARAFATE Take 10 mLs (1 g total) by mouth 4 (four) times daily -  with meals and at bedtime.   torsemide 100 MG tablet Commonly known as:  DEMADEX Take 1 tablet (100 mg total) by mouth 2 (two) times daily. Take 100mg  in the morning and 50mg  in the evening   traZODone 50 MG tablet Commonly known as:  DESYREL Take 1 tablet (50 mg total) by mouth at bedtime as needed for sleep.   warfarin 5 MG tablet Commonly known as:  COUMADIN Take as directed. If you are unsure how to take this medication, talk to your nurse or doctor. Original instructions:  Take 1-1.5 tablets (5-7.5 mg total) by mouth See admin instructions. Take 1 and 1/2 tablets (7.5 mg) daily except 1 tablet (5 mg) on Monday and Friday What changed:  additional instructions            Durable Medical Equipment  (From admission, onward)        Start     Ordered   03/02/18 1433  Heart failure home health orders  (Heart failure home health orders / Face to face)  Once    Comments:  Heart Failure Follow-up Care:  Verify follow-up appointments per Patient Discharge Instructions. Confirm transportation arranged. Reconcile home medications with discharge medication list. Remove discontinued medications from use. Assist patient/caregiver to manage medications using pill box. Reinforce low sodium food selection Assessments: Vital signs and oxygen saturation at each visit. Assess home environment for safety concerns, caregiver support and availability of low-sodium foods. Consult Education officer, museum, PT/OT, Dietitian, and CNA based on assessments. Perform comprehensive cardiopulmonary assessment. Notify MD for any change in condition or weight gain of 3 pounds in one day or 5 pounds in one week with symptoms. Daily Weights and Symptom Monitoring: Ensure patient has access to scales. Teach patient/caregiver to weigh  daily before breakfast and after voiding using same scale and record.  Teach patient/caregiver to track weight and symptoms and when to notify Provider. Activity: Develop individualized activity plan with patient/caregiver.  [facility] to provide  Labs every other week to include BMET, Mg, and CBC with Diff. Additional as needed. Should be drawn via PERIPHERAL stick. NOT PICC line.   J1250 Dobutamine 7.5 mcg/kg/min X 52 weeks A4221 Supplies for maintenance of drug infusion catheter A4222 Supplies for the external drug infusion per cassette or bag E0781 Ambulatory Infusion pump  Question Answer Comment  Heart Failure Follow-up Care [facility] ([facility]) [facility] at [phone]   Obtain the following labs Other see comments   Lab frequency Other see comments   Fax lab results to A[facility] Clinic at [phone]   Diet Low Sodium Heart Healthy   Fluid restrictions: 2000 mL Fluid   Skilled Nurse to notify MD of weight trends weekly for first 2 weeks. May fax or call: [facility] Clinic at [phone] (fax) or [facility] Diuretic Protocol to be used by [place] only ( to be ordered by Heart Failure Team Providers Only) Yes      03/02/18 1434      Disposition   The patient will be discharged in stable condition to [facility]. Discharge Instructions    (HEART FAILURE PATIENTS) Call MD:  Anytime you have any of the following symptoms: 1) 3 pound weight gain in 24 hours or 5 pounds in 1 week 2) shortness of breath, with or without a dry hacking cough 3) swelling in the hands, feet or stomach 4) if you have to sleep on extra pillows at night in order to breathe.   Complete by:  As directed    Diet - low sodium heart healthy   Complete by:  As directed    Heart Failure patients record your daily weight using the same scale at the same time of day   Complete by:  As directed    INR  Goal: 1.8 - 2.3   Complete by:  As directed    Goal:  1.8 - 2.3   Increase  activity slowly   Complete by:  As directed    Page VAD Coordinator at [phone]  Notify for: any VAD alarms, sustained elevations of power >10 watts, sustained drop in Pulse Index <3   Complete by:  As directed    Notify for:   any VAD alarms sustained elevations of power >10 watts sustained drop in Pulse Index <3     Speed Settings:   Complete by:  As directed    Fixed 5800 RPM Low 5500 RPM     Follow-up Information    [facility] Follow up on 03/13/2018.   Specialty:  Cardiology Why:  10:00 am VAD clinic.  Garage code is Sales executive information: [address] [phone] [place] [phone]            Duration of Discharge Encounter: Greater than 35 minutes   Signed, Georgiana Shore, NP  03/06/2018, 1:18 PM  Patient seen and examined with the above-signed Advanced Practice Provider and/or Housestaff. I personally reviewed laboratory data, imaging studies and relevant notes. I independently examined the patient and formulated the important aspects of the plan. I have edited the note to reflect any of my changes or salient points. I have personally discussed the plan with the patient and/or family.  Agree with above. He is ready for d/c to SNF.  Glori Bickers,  MD  10:54 PM

## 2018-03-02 NOTE — Progress Notes (Signed)
LVAD Coordinator Rounding Note:  Admitted 02/23/18 to Dr. Haroldine Laws due to ascites and CHF with fluid overload.   HM III LVAD implanted on 09/09/16 by Dr. Cyndia Bent under Destination Therapy criteria.  Pt in bed asleep.  Vital signs: Temp:  97.6 HR: 80 Doppler Pressure: 68 Automatic BP: 91/64 (74) O2 Sat: 99 % on RA Wt: 222>203>198>194lbs  LVAD interrogation reveals:  Speed: 5800 Flow: 5.5 Power: 4.5 w PI:  2.4 Alarms: none Events: rare PI events Hematocrit:  30 Fixed speed:  5800 Low speed limit: 5500  Primary Controller: Replace back up battery in78months. Back up controller: Replace back up battery in26months.  Drive Line: left abdominal sorbaview dressing dry and intact; anchor intact and accurately applied. Change weekly; due today. Driveline anchored and secured. Pt has an intolerance to CHG please use saline and betadine only.  Labs:  LDH trend: 156>128>138>125  INR trend: 2.61>2.27>2.15>2.05  Gtts: Dobutamine 7.5 mcg/kg/min Lasix 20 mg/hr  Anticoagulation Plan: -INR Goal:  1.8 - 2.3 -ASA Dose: no ASA (history of GI bleeding)  Device: -Protect; end of service 08/2016 -Therapies:  Off Last check: complete interrogation 05/26/2017. DDD (lower rate 60); BiV pacing with 96% AS-VP  Adverse Events on VAD: 11/30/16> Milrinone gtt at discharge 01/27/17>dobutamine at 5 mcg/kg/mingtt at discharge 03/08/17> RV failure 04/29/17> symptomatic anemia and melena. EGD revealed stomach ulcers. Carafate added; ASA stopped 05/31/17>Ongoing symptomatic anemia and melena; capsule study mild antral gastritis; poss tiny AVM right colon. Lower MAPS, Losartan cut back. 06/2017> admit with syncope, AMS, Dobutamine at 7.69mcg 11/09/17>Marked ascites, RV failure, and hepatic encephalopathy. Paracentesis on 2/7 and 2/12 with >11 liters fluid removed 12/27/17>Outpatient paracentesis with 2.8 liters removed 01/11/18>IP paracentesis with 4.8 liters removed 01/16/18>IP paracentesis w/2.4L  removed 02/15/18>OP paracentesis w/ 5 L removed 02/23/18>IP paracentesis w/ 5.6 L removed   Plan/Recommendations:  1. Bedside nurse-Please change pts driveline dressing today. Will need weekly dressing changes; due next today. Bedside RN may change dressing w/above instructions. 2. Planning for pt to d/c to [facility] in the next few days for long term care.  3. Please call VAD pager with any VAD equipment or drive line issues.  Tanda Rockers RN, VAD Coordiantor 24/7 VAD pager: [phone]

## 2018-03-03 LAB — COOXEMETRY PANEL
CARBOXYHEMOGLOBIN: 2 % — AB (ref 0.5–1.5)
METHEMOGLOBIN: 1.6 % — AB (ref 0.0–1.5)
O2 Saturation: 64.7 %
TOTAL HEMOGLOBIN: 8.7 g/dL — AB (ref 12.0–16.0)

## 2018-03-03 LAB — BASIC METABOLIC PANEL
Anion gap: 11 (ref 5–15)
Anion gap: 8 (ref 5–15)
BUN: 27 mg/dL — AB (ref 6–20)
BUN: 28 mg/dL — ABNORMAL HIGH (ref 6–20)
CALCIUM: 8.8 mg/dL — AB (ref 8.9–10.3)
CALCIUM: 8.6 mg/dL — AB (ref 8.9–10.3)
CHLORIDE: 90 mmol/L — AB (ref 101–111)
CO2: 32 mmol/L (ref 22–32)
CO2: 32 mmol/L (ref 22–32)
CREATININE: 1.34 mg/dL — AB (ref 0.61–1.24)
Chloride: 89 mmol/L — ABNORMAL LOW (ref 101–111)
Creatinine, Ser: 1.33 mg/dL — ABNORMAL HIGH (ref 0.61–1.24)
GFR calc Af Amer: >60 mL/min (ref >60)
GFR calc non Af Amer: 56 mL/min — ABNORMAL LOW (ref >60)
GFR, EST NON AFRICAN AMERICAN: 56 mL/min — AB (ref >60)
Glucose, Bld: 198 mg/dL — ABNORMAL HIGH (ref 65–99)
Glucose, Bld: 166 mg/dL — ABNORMAL HIGH (ref 65–99)
Potassium: 3.4 mmol/L — ABNORMAL LOW (ref 3.5–5.1)
Potassium: 3.5 mmol/L (ref 3.5–5.1)
SODIUM: 130 mmol/L — AB (ref 135–145)
Sodium: 132 mmol/L — ABNORMAL LOW (ref 135–145)

## 2018-03-03 LAB — GLUCOSE, CAPILLARY
GLUCOSE-CAPILLARY: 180 mg/dL — AB (ref 65–99)
Glucose-Capillary: 146 mg/dL — ABNORMAL HIGH (ref 65–99)
Glucose-Capillary: 181 mg/dL — ABNORMAL HIGH (ref 65–99)
Glucose-Capillary: 186 mg/dL — ABNORMAL HIGH (ref 65–99)

## 2018-03-03 LAB — MAGNESIUM: Magnesium: 2.4 mg/dL (ref 1.7–2.4)

## 2018-03-03 LAB — LACTATE DEHYDROGENASE: LDH: 142 U/L (ref 98–192)

## 2018-03-03 LAB — CBC
HCT: 30 % — ABNORMAL LOW (ref 39.0–52.0)
Hemoglobin: 8.5 g/dL — ABNORMAL LOW (ref 13.0–17.0)
MCH: 22 pg — ABNORMAL LOW (ref 26.0–34.0)
MCHC: 28.3 g/dL — ABNORMAL LOW (ref 30.0–36.0)
MCV: 77.5 fL — ABNORMAL LOW (ref 78.0–100.0)
PLATELETS: 219 10*3/uL (ref 150–400)
RBC: 3.87 MIL/uL — AB (ref 4.22–5.81)
RDW: 17.6 % — ABNORMAL HIGH (ref 11.5–15.5)
WBC: 6.6 10*3/uL (ref 4.0–10.5)

## 2018-03-03 LAB — PROTIME-INR
INR: 2.04
Prothrombin Time: 22.8 seconds — ABNORMAL HIGH (ref 11.4–15.2)

## 2018-03-03 IMAGING — CR DG CHEST 1V
1 series · 1 of 1 positions shown · non-contrast
Comparison: 10/07/2016.

CLINICAL DATA: Post left-sided thoracentesis.  No chest complaints.

EXAM:
CHEST 1 VIEW

[chest pa]
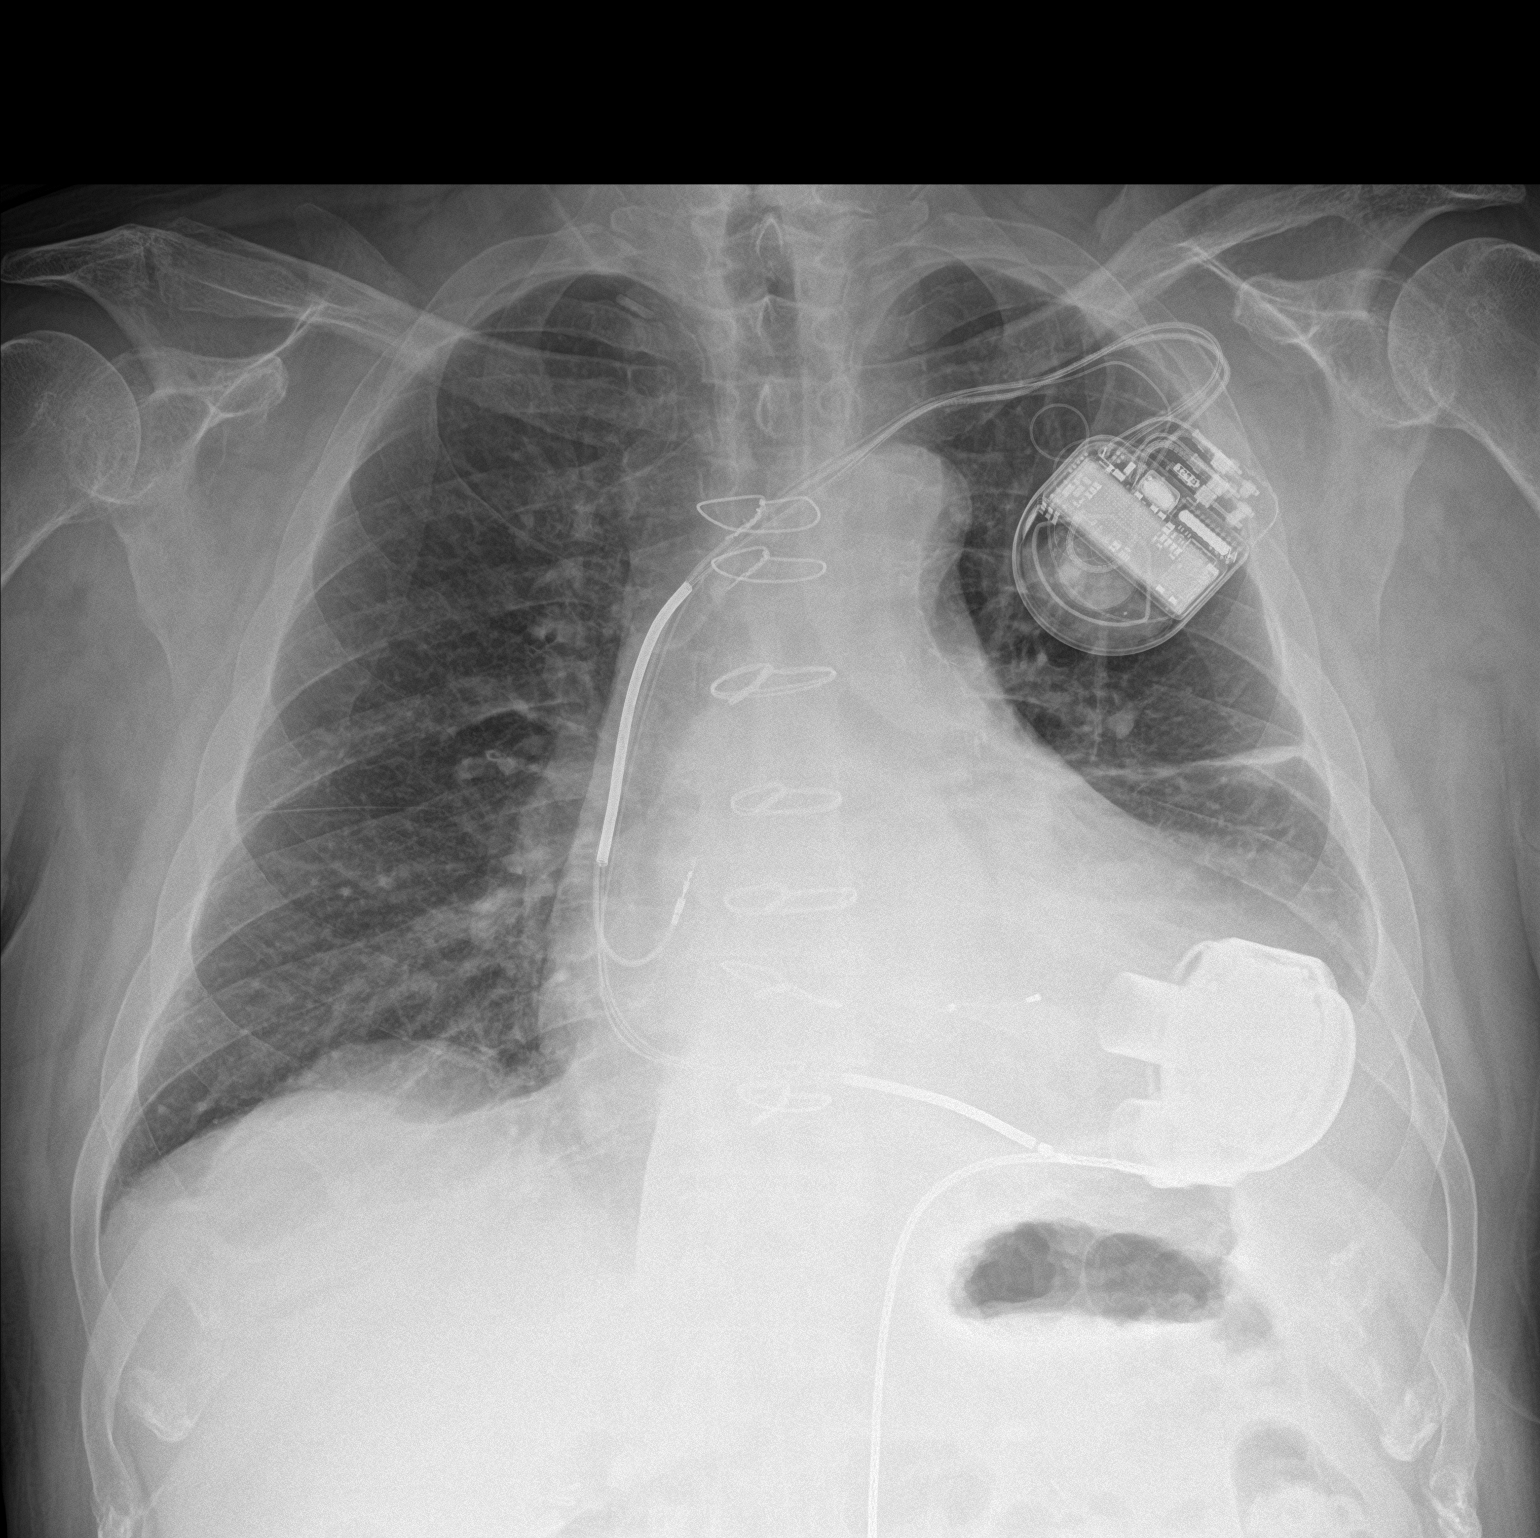

[1 of 1 positions shown; findings below may reference images not displayed]

FINDINGS: The left pleural effusion appears slightly decreased in volume.
There is persistent left basilar pulmonary opacity, likely
atelectasis. The right lung is clear. There is no pneumothorax.
There is stable cardiomegaly post median sternotomy, AICD and left
ventricular assist device placement.
IMPRESSION: No demonstrated complication following thoracentesis. The left
pleural effusion appears slightly smaller.

## 2018-03-03 MED ORDER — DOXYCYCLINE HYCLATE 100 MG PO TABS: 100.0000 mg | ORAL_TABLET | Freq: Two times a day (BID) | ORAL | 0 refills | Status: AC

## 2018-03-03 MED ORDER — POTASSIUM CHLORIDE CRYS ER 20 MEQ PO TBCR
40.0000 meq | EXTENDED_RELEASE_TABLET | Freq: Once | ORAL | Status: AC
  Administered 2018-03-03: 40 meq via ORAL
  Filled 2018-03-03: qty 2

## 2018-03-03 MED ORDER — WARFARIN SODIUM 5 MG PO TABS
5.0000 mg | ORAL_TABLET | Freq: Once | ORAL | Status: AC
  Administered 2018-03-03: 5 mg via ORAL
  Filled 2018-03-03: qty 1

## 2018-03-03 MED ORDER — POTASSIUM CHLORIDE CRYS ER 20 MEQ PO TBCR
40.0000 meq | EXTENDED_RELEASE_TABLET | Freq: Every day | ORAL | Status: DC
  Administered 2018-03-03 – 2018-03-06 (×4): 40 meq via ORAL
  Filled 2018-03-03 (×4): qty 2

## 2018-03-03 MED ORDER — WARFARIN SODIUM 5 MG PO TABS: 5.0000 mg | ORAL_TABLET | ORAL | Status: DC

## 2018-03-03 MED ORDER — LEVOTHYROXINE SODIUM 50 MCG PO TABS: 50.0000 ug | ORAL_TABLET | Freq: Every day | ORAL | 6 refills | Status: AC

## 2018-03-03 NOTE — ACP (Advance Care Planning) (Signed)
Medicine RN Note: Rec'd a request to complete MOST with Ronald Miller. I sat with him and reviewed the form, his wishes, and how we completed it. Original was signed by Ronald Miller and PMT NP Mariana Kaufman and placed on shadow chart for transport with pt to [facility]. Copy also placed on shadow chart for scanning into Epic.  Decisions as outlined below: No CPR (patient has VAD) No intubation, no ventilation; may use non-invasive O2 support. No long term or temporary feeding tube Use of IV fluids and antibiotics is to be determined at the time it is needed; OK to use for comfort but not for life prolongation Return to the hospital if necessary, but avoid the ICU If death is imminent, please use all necessary means to maintain comfort.  Marjie Skiff Tyreisha Ungar, RN, BSN, [facility] Palliative Medicine Team 03/03/2018 2:01 PM Office [phone]

## 2018-03-03 NOTE — Progress Notes (Signed)
Palliative Medicine RN Note: Rec'd a request to complete MOST with Mr Rorke. I sat with him and reviewed the form, his wishes, and how we completed it. Original was signed by Mr Biernat and PMT NP Mariana Kaufman and placed on shadow chart for transport with pt to [facility]. Copy also placed on shadow chart for scanning into Epic.  Decisions as outlined below: No CPR (patient has VAD) No intubation, no ventilation; may use non-invasive O2 support. No long term or temporary feeding tube Use of IV fluids and antibiotics is to be determined at the time it is needed; OK to use for comfort but not for life prolongation Return to the hospital if necessary, but avoid the ICU If death is imminent, please use all necessary means to maintain comfort.  Marjie Skiff Hyacinth Marcelli, RN, BSN, [facility] Palliative Medicine Team 03/03/2018 2:01 PM Office [phone]

## 2018-03-03 NOTE — Progress Notes (Addendum)
Advanced Heart Failure VAD Team Note  PCP-Cardiologist: Glori Bickers, MD   Subjective:    Remains on dobutamine 7.5 mcg/kg/min and Lasix gtt at 20.  Modest diuresis on lasix drip + diamox. Weight down another 4 lbs to 190 lbs.  Co-ox 64%. Creatinine stable 1.34. K 3.5. CVP 14  Wound culture from chest shows moderate staph aureus.   No CP, SOB, orthopnea. Anxious for d/c. No bleeding. No encephalopathy   S/P Paracentesis 5/23 -->5.6 liters removed.   LVAD INTERROGATION:  HeartMate III LVAD:   Flow 4.5 liters/min, speed 5800, power 6, PI 2.6. 2 PI events/24 hrs. Personally reviewed.    Objective:    Vital Signs:   Temp:  [97.4 F (36.3 C)-98.2 F (36.8 C)] 98.2 F (36.8 C) (05/31 0445) Pulse Rate:  [80] 80 (05/31 0445) Resp:  [15-25] 17 (05/31 0445) BP: (47-156)/(19-135) 88/76 (05/31 0452) SpO2:  [96 %-97 %] 96 % (05/31 0445) Weight:  [190 lb 11.2 oz (86.5 kg)] 190 lb 11.2 oz (86.5 kg) (05/31 0445) Last BM Date: 03/01/18 Mean arterial Pressure 70-80s Intake/Output:   Intake/Output Summary (Last 24 hours) at 03/03/2018 0703 Last data filed at 03/03/2018 0500 Gross per 24 hour  Intake 1569.2 ml  Output 3275 ml  Net -1705.8 ml     Physical Exam   CVP: 14 GENERAL: Drowsy. But fully oriented NAD.  HEENT: Normal. anicteric NECK: Supple, JVP 14 cm. Carotids OK.  CARDIAC:  Mechanical heart sounds with LVAD hum present.  LUNGS:  CTAB, normal effort.  No wheeze ABDOMEN:  NT, ND, no HSM. No bruits or masses. +BS  LVAD exit site:  Dressing dry and intact. No erythema or drainage. Stabilization device present and accurately applied. Driveline dressing changed daily per sterile technique. EXTREMITIES:  Warm and dry. No cyanosis, clubbing, rash, or edema. + dressinggs  Neuro: alert & oriented x 3, cranial nerves grossly intact. moves all 4 extremities w/o difficulty. Affect pleasant   Telemetry   AV paced 80s. Personally reviewed.  EKG    No new tracings.   Labs    Basic Metabolic Panel: Recent Labs  Lab 02/27/18 0500 02/28/18 0606 03/01/18 0439 03/02/18 0425 03/02/18 2232 03/03/18 0440  NA 132* 131* 132* 131* 132* 130*  K 3.4* 3.3* 4.0 2.9* 3.4* 3.5  CL 89* 86* 87* 84* 89* 90*  CO2 35* 33* 35* 34* 32 32  GLUCOSE 145* 175* 157* 169* 198* 166*  BUN 20 21* 22* 25* 27* 28*  CREATININE 1.17 1.25* 1.43* 1.38* 1.33* 1.34*  CALCIUM 8.5* 8.6* 8.8* 8.8* 8.8* 8.6*  MG 2.0 2.2 2.2 2.2  --  2.4    Liver Function Tests: No results for input(s): AST, ALT, ALKPHOS, BILITOT, PROT, ALBUMIN in the last 168 hours. No results for input(s): LIPASE, AMYLASE in the last 168 hours. Recent Labs  Lab 02/27/18 0500  AMMONIA 28    CBC: Recent Labs  Lab 02/27/18 0500 02/28/18 0606 03/01/18 0439 03/02/18 0425 03/03/18 0440  WBC 5.6 6.9 6.9 6.5 6.6  HGB 7.0* 8.3* 8.7* 8.5* 8.5*  HCT 24.9* 29.5* 30.3* 29.8* 30.0*  MCV 78.1 77.4* 77.1* 76.8* 77.5*  PLT 156 208 209 219 219    INR: Recent Labs  Lab 02/27/18 0500 02/28/18 0606 03/01/18 0439 03/02/18 0425 03/03/18 0440  INR 2.07 2.27 2.15 2.05 2.04    Other results:     Imaging   No results found.   Medications:     Scheduled Medications: . acetaZOLAMIDE  500 mg Oral Q12H  .  amiodarone  200 mg Oral Daily  . citalopram  20 mg Oral Daily  . digoxin  0.125 mg Oral Daily  . doxycycline  100 mg Oral Q12H  . gabapentin  300 mg Oral BID  . insulin aspart  0-9 Units Subcutaneous TID WC  . lactulose  45 g Oral TID  . levothyroxine  50 mcg Oral QAC breakfast  . magnesium oxide  400 mg Oral Daily  . midodrine  10 mg Oral TID WC  . multivitamin with minerals  1 tablet Oral Daily  . pantoprazole  40 mg Oral Daily  . spironolactone  25 mg Oral Daily  . sucralfate  1 g Oral TID WC & HS  . Warfarin - Pharmacist Dosing Inpatient   Does not apply q1800    Infusions: . DOBUTamine 7.5 mcg/kg/min (03/03/18 0500)  . furosemide (LASIX) infusion 20 mg/hr (03/03/18 0500)    PRN  Medications: acetaminophen, cyclobenzaprine, docusate sodium, ondansetron, senna-docusate, traZODone   Patient Profile  ILAI HILLER Jr.is a 62 y.o.male with history of chronic systolic HF s/p Medtronic ICD 2014, CAD s/p CABG in 2000, HTN, Hx of cocaine abuse, Tobacco abuse, Depression, PAF, and COPD. HM3 placed 09/09/2016. In April 2018 he was placed on milrinone but later switched to dobutamine. He remains on dobutamine 7.82mcg  Admitted 02/23/18 with marked volume overload.    Assessment/Plan:     1.Acute/Chronic end-stage biventricular systolic HK:VQQV 95% due to ICM ->s/p Echo 08/24/16 LVEF 15%, RV mild dilated, moderately reduced. -->S/P HM3 LVAD 09/09/2016. Has been turned down for transplant at [facility]  - Volume status improving, still mildly elevated on exam. He was discharged the end of April. Weight was 189 pounds. He is 190 lbs this morning. CVP 14 - Continue dobutamine 7.5 mcg. CO-OX 64%.  - Continue lasix and diamox this morning. Switch to torsemide 100 mg BID + metolazone 2x/week (home doses) when bed is confirmed. - Continue midodrine 10 TID for low MAPs and cirrhotic physiology.MAPs 70-80s - Off sildenafil due to hypotension - No beta blocker with RV failure.  - VAD interrogated personally. Parameters stable.  - Warfarin with goal INR 1.8-2.3.INR 2.04.  Discussed dosing with PharmD personally. -LDH 142 - Not transplant candidate due to social issues, noncompliance and cirrhosis (has been turned down by [facility]) - Continue digoxin. Dig level 0.8 5/29 - Consider repeat paracentesis if needed.  2. CAD: Severe 3v- CAD s/p CABG with occluded grafts as above except for LIMA.  - No s/s ischemia 3. DMII:  - Continue insulin regimen. Continue SSI 4. Tobacco abuse:  - Reports complete cessation. No change.  5. Atrial fibrillation, paroxysmal - Device interrogated with Dr. Lovena Le on 5/26. Device at EOL  (will need to follow closely. Not good candidate for gen change but may  not have a choice) - Rhythm very slow sinus brady with ventricular escape.  - Reprogrammed to AV pace. Continues to AV pace.  - Continue amio 200 daily.  6. Anxiety/Depression - Continue Celexa 20mg  daily . - Palliative Care consult. Open to long term placement at [facility]. No change.  7. Cirrhosis with hepatic encephalopathy - likely combination ETOH and chronic RV failure - No asterixis -Ammonia level 30 -> 28. Continue lactulose at 45 tid. No change.  -S/P Paracentesis 5/23 with 5.6 liters removed.  8. Anemia, iron-deficiency:  - Has had GI work up with EGD and Capsule Endoscopy showing severe gastropath No evidence of active bleeding. Continue carafate. - Hgb 8.5 today. No s/s bleeding.  9. GOC: Palliative Care consulted. He is DNR/DNI. Plans for long term care at [facility].  10. Hypokalemia - K 3.5. Will supp.  11. Hypothyroidism - Continue synthroid 50 mcg daily. No change.  12. Wound on chest - WOCN consulted. - Wound culture from chest shows moderate staph aureus. Continue doxy through 6/4.  He has no insight into his disease and no ability for self care. Will need long-term residence at [place] at d/c. He says it will be cheaper for him if he returns by 6/1. Will try to arrange. He is DNR/DNI.   I reviewed the LVAD parameters from today, and compared the results to the patient's prior recorded data.  No programming changes were made.  The LVAD is functioning within specified parameters.  The patient performs LVAD self-test daily.  LVAD interrogation was negative for any significant power changes, alarms or PI events/speed drops.  LVAD equipment check completed and is in good working order.  Back-up equipment present.   LVAD education done on emergency procedures and precautions and reviewed exit site care.  Anticipate discharge today if bed is available.   Length of Stay: Isabella, NP 03/03/2018, 7:03 AM  VAD Team --- VAD ISSUES ONLY--- Pager [phone]  (7am - 7am)  Advanced Heart Failure Team  Pager [phone] (M-F; 7a - 4p)  Please contact [facility] for night-coverage after hours (4p -7a ) and weekends on [email or web address]   Patient seen and examined with the above-signed Advanced Practice Provider and/or Housestaff. I personally reviewed laboratory data, imaging studies and relevant notes. I independently examined the patient and formulated the important aspects of the plan. I have edited the note to reflect any of my changes or salient points. I have personally discussed the plan with the patient and/or family.  I think he is about as good as we are going to get him. Weight down to previous d/c weight. Can d/c to SNF later today if bed available. Continue dobutamine for RV support. Resume oral torsemide (continue IV lasix until bed is available). INR 2.04. VAD interrogated personally. Parameters stable.  Glori Bickers, MD  5:00 PM  Addendum:  Attempted to do peer-to-peer with insurance company for approval but office close at 5p. Will d/w SW again to see if we can get approval over the weekend.   Glori Bickers, MD  6:38 PM

## 2018-03-03 NOTE — Progress Notes (Signed)
Physical Therapy Treatment Patient Details Name: Ronald Miller. MRN: [id number] DOB: [birth year] Today's Date: 03/03/2018    History of Present Illness Pt is a 62 y.o. male with PMH including LVAD implant (09/2016), CHF, CAD, HTN, depression, COPD, admitted 02/23/18 with marked abdominal bloating; worked up for acute on chronic end-stage biventricular systolic HF. Of note, multiple recent admission with recent d/c home from [facility] SNF.    PT Comments    Ronald Miller, initially resistant to mobility but with encouragement agreeable to participate. Pt states he is "not used to batteries" when trying to transition power and wearing around his neck. P.T. Performed transition to batteries and Ronald Miller transitioned back to main power with mod cues and increased time requiring 35 sec to transition black line and cues to find other power source. Pt with partial knee buckling this session but able to maintain walking and gait with guarding. Will continue to follow.  VSS HR 80 Flow 5.5, power 4.6, PI 2.6, speed 5800   Follow Up Recommendations  SNF;Supervision/Assistance - 24 hour     Equipment Recommendations  None recommended by PT    Recommendations for Other Services       Precautions / Restrictions Precautions Precautions: Fall Precaution Comments: LVAD Restrictions Weight Bearing Restrictions: No    Mobility  Bed Mobility               General bed mobility comments: sitting EOB on arrival   Transfers Overall transfer level: Needs assistance   Transfers: Sit to/from Stand Sit to Stand: Min guard         General transfer comment: guarding for lines and safety with pt requiring cues to attend to driveline and power source  Ambulation/Gait Ambulation/Gait assistance: Min guard Ambulation Distance (Feet): 170 Feet Assistive device: Rolling walker (2 wheeled) Gait Pattern/deviations: Step-through pattern;Decreased stride length;Trunk flexed   Gait velocity interpretation:  <1.8 ft/sec, indicate of risk for recurrent falls General Gait Details: flexed trunk with partial knee buckling with gait today. Close guarding for balance with cues to step into RW, limited by fatigue   Stairs             Wheelchair Mobility    Modified Rankin (Stroke Patients Only)       Balance Overall balance assessment: Needs assistance Sitting-balance support: No upper extremity supported;Feet supported Sitting balance-Leahy Scale: Good     Standing balance support: Single extremity supported;Bilateral upper extremity supported Standing balance-Leahy Scale: Poor Standing balance comment: able to stand to urinate with one hand on surface, bil UE for gait                            Cognition Arousal/Alertness: Awake/alert Behavior During Therapy: Flat affect Overall Cognitive Status: Impaired/Different from baseline Area of Impairment: Memory;Safety/judgement                 Orientation Level: Time Current Attention Level: Selective Memory: Decreased short-term memory Following Commands: Follows one step commands consistently;Follows one step commands with increased time Safety/Judgement: Decreased awareness of deficits;Decreased awareness of safety   Problem Solving: Slow processing;Decreased initiation;Difficulty sequencing;Requires verbal cues General Comments: not oriented to day, mod cues for power transition with increased time      Exercises      General Comments        Pertinent Vitals/Pain Pain Assessment: No/denies pain    Home Living  Prior Function            PT Goals (current goals can now be found in the care plan section) Progress towards PT goals: Progressing toward goals    Frequency    Min 2X/week      PT Plan Current plan remains appropriate    Co-evaluation              AM-PAC PT "6 Clicks" Daily Activity  Outcome Measure  Difficulty turning over in bed  (including adjusting bedclothes, sheets and blankets)?: None Difficulty moving from lying on back to sitting on the side of the bed? : None Difficulty sitting down on and standing up from a chair with arms (e.g., wheelchair, bedside commode, etc,.)?: A Little Help needed moving to and from a bed to chair (including a wheelchair)?: A Little Help needed walking in hospital room?: A Little Help needed climbing 3-5 steps with a railing? : A Little 6 Click Score: 20    End of Session   Activity Tolerance: Patient tolerated treatment well Patient left: in chair;with call bell/phone within reach Nurse Communication: Mobility status PT Visit Diagnosis: Other abnormalities of gait and mobility (R26.89);Muscle weakness (generalized) (M62.81)     Time: 0923-3007 PT Time Calculation (min) (ACUTE ONLY): 34 min  Charges:  $Gait Training: 8-22 mins $Therapeutic Activity: 8-22 mins                    G Codes:       Elwyn Reach, PT [phone]    [place] 03/03/2018, 9:52 AM

## 2018-03-03 NOTE — Clinical Social Work Note (Addendum)
Insurance authorization still pending. [facility] confirmed they received the clinicals yesterday afternoon and they have been uploaded but no one has been assigned to the case yet. CSW left voicemail for SNF admissions coordinator.  Dayton Scrape, CSW [phone]  1:57 pm Authorization still pending but it has been assigned to a case worker.  Dayton Scrape, [place]  4:37 pm [place] director is requesting an updated PT note. CSW faxed over. NP aware.  Dayton Scrape, [place] [phone]  5:08 pm [place] director is requesting a peer-to-peer review. NP notified and given contact information. Carolynn Sayers with [facility] notified as well.  Dayton Scrape, [place]

## 2018-03-03 NOTE — Progress Notes (Signed)
LVAD Coordinator Rounding Note:  Admitted 02/23/18 to Dr. Haroldine Laws due to ascites and CHF with fluid overload.   HM III LVAD implanted on 09/09/16 by Dr. Cyndia Bent under Destination Therapy criteria.  Pt walking with PT.  Vital signs: Temp:  97.5 HR: 80 Doppler Pressure: 74 Automatic BP: 83/39 (48) O2 Sat: 94 % on RA Wt: 222>203>198>194>190lbs  LVAD interrogation reveals:  Speed: 5800 Flow: 4.5 Power: 6 w PI:  2.6 Alarms: none Events: rare PI events Hematocrit:  30 Fixed speed:  5800 Low speed limit: 5500  Primary Controller: Replace back up battery in4months. Back up controller: Replace back up battery in68months.  Drive Line: left abdominal sorbaview dressing dry and intact; anchor intact and accurately applied. Change weekly; due 03/09/18 . Driveline anchored and secured. Pt has an intolerance to CHG please use saline and betadine only.  Labs:  LDH trend: 156>128>138>125>142  INR trend: 2.61>2.27>2.15>2.05>2.04  Gtts: Dobutamine 7.5 mcg/kg/min Lasix 20 mg/hr  Anticoagulation Plan: -INR Goal:  1.8 - 2.3 -ASA Dose: no ASA (history of GI bleeding)  Device: -Protect; end of service 08/2016 -Therapies:  Off Last check: complete interrogation 05/26/2017. DDD (lower rate 60); BiV pacing with 96% AS-VP  Adverse Events on VAD: 11/30/16> Milrinone gtt at discharge 01/27/17>dobutamine at 5 mcg/kg/mingtt at discharge 03/08/17> RV failure 04/29/17> symptomatic anemia and melena. EGD revealed stomach ulcers. Carafate added; ASA stopped 05/31/17>Ongoing symptomatic anemia and melena; capsule study mild antral gastritis; poss tiny AVM right colon. Lower MAPS, Losartan cut back. 06/2017> admit with syncope, AMS, Dobutamine at 7.46mcg 11/09/17>Marked ascites, RV failure, and hepatic encephalopathy. Paracentesis on 2/7 and 2/12 with >11 liters fluid removed 12/27/17>Outpatient paracentesis with 2.8 liters removed 01/11/18>IP paracentesis with 4.8 liters removed 01/16/18>IP  paracentesis w/2.4L removed 02/15/18>OP paracentesis w/ 5 L removed 02/23/18>IP paracentesis w/ 5.6 L removed   Plan/Recommendations:  1. Discharging to [facility] today pending insurance approval. 2. Return to [place] clinic Monday at 1200. 3. Please call VAD pager with any VAD equipment or drive line issues.  Tanda Rockers RN, VAD Coordiantor 24/7 VAD pager: [phone]

## 2018-03-03 NOTE — Progress Notes (Signed)
ANTICOAGULATION CONSULT NOTE - [place] for warfarin Indication: LVAD  Allergies  Allergen Reactions  . Chlorhexidine Gluconate Itching and Rash  . Codeine Nausea And Vomiting and Other (See Comments)    In high doses  . Lipitor [Atorvastatin] Nausea Only and Other (See Comments)    Nausea with high doses, tolerates 20mg  dose (08/22/16)  . Tape Other (See Comments)    Paper tape is ok    Patient Measurements: Height: 5\' 9"  (175.3 cm) Weight: 190 lb 11.2 oz (86.5 kg) IBW/kg (Calculated) : 70.7  Vital Signs: Temp: 97.5 F (36.4 C) (05/31 0756) Temp Source: Oral (05/31 0756) BP: 83/39 (05/31 0756) Pulse Rate: 80 (05/31 0925)  Labs: Recent Labs    03/01/18 0439 03/02/18 0425 03/02/18 2232 03/03/18 0440  HGB 8.7* 8.5*  --  8.5*  HCT 30.3* 29.8*  --  30.0*  PLT 209 219  --  219  LABPROT 23.9* 23.0*  --  22.8*  INR 2.15 2.05  --  2.04  CREATININE 1.43* 1.38* 1.33* 1.34*    Estimated Creatinine Clearance: 63 mL/min (A) (by C-G formula based on SCr of 1.34 mg/dL (H)).   Medical History: Past Medical History:  Diagnosis Date  . AICD (automatic cardioverter/defibrillator) present   . Anxiety   . ASCVD (arteriosclerotic cardiovascular disease)   . Chronic systolic CHF (congestive heart failure) ([place])   . COPD (chronic obstructive pulmonary disease) ([place])   . Coronary artery disease   . Depression   . GI bleed   . High cholesterol   . History of blood transfusion   . History of cocaine abuse   . Hypertension   . LVAD (left ventricular assist device) present ([place])   . Presence of permanent cardiac pacemaker   . Shortness of breath dyspnea   . Suicidal ideation   . Tobacco abuse   . Type II diabetes mellitus (H[facility])      Assessment: 57 yoM s/p HM3 LVAD on warfarin PTA. INR therapeutic at 2.05, LDH stable, Hgb stable, no S/Sx bleeding. Note: levothyroxine starting which may increase warfarin effects and doxycycline will be added for 7  days.  As discharged, recommend warfarin 7.5 mg daily except 5 mg Mon/Fri with INR F/U early next week).  PTA Dose = 5mg  Mon/Fri, 7.5mg  all other days  Goal of Therapy:  INR 1.8-2.4 Monitor platelets by anticoagulation protocol: Yes   Plan:  -Warfarin 5 mg PO x1 tonight -Daily PT/INR  Marguerite Olea, [facility] Clinical Pharmacist Pager [phone]  03/03/2018 10:02 AM

## 2018-03-03 NOTE — Plan of Care (Signed)
Continue current care plan 

## 2018-03-04 LAB — CBC
HEMATOCRIT: 29.4 % — AB (ref 39.0–52.0)
Hemoglobin: 8.2 g/dL — ABNORMAL LOW (ref 13.0–17.0)
MCH: 21.8 pg — AB (ref 26.0–34.0)
MCHC: 27.9 g/dL — AB (ref 30.0–36.0)
MCV: 78.2 fL (ref 78.0–100.0)
Platelets: 210 10*3/uL (ref 150–400)
RBC: 3.76 MIL/uL — ABNORMAL LOW (ref 4.22–5.81)
RDW: 17.7 % — AB (ref 11.5–15.5)
WBC: 6.7 10*3/uL (ref 4.0–10.5)

## 2018-03-04 LAB — GLUCOSE, CAPILLARY
GLUCOSE-CAPILLARY: 165 mg/dL — AB (ref 65–99)
Glucose-Capillary: 142 mg/dL — ABNORMAL HIGH (ref 65–99)
Glucose-Capillary: 171 mg/dL — ABNORMAL HIGH (ref 65–99)
Glucose-Capillary: 211 mg/dL — ABNORMAL HIGH (ref 65–99)

## 2018-03-04 LAB — BASIC METABOLIC PANEL
ANION GAP: 8 (ref 5–15)
BUN: 31 mg/dL — AB (ref 6–20)
CALCIUM: 8.4 mg/dL — AB (ref 8.9–10.3)
CO2: 31 mmol/L (ref 22–32)
Chloride: 91 mmol/L — ABNORMAL LOW (ref 101–111)
Creatinine, Ser: 1.41 mg/dL — ABNORMAL HIGH (ref 0.61–1.24)
GFR calc Af Amer: >60 mL/min (ref >60)
GFR calc non Af Amer: 52 mL/min — ABNORMAL LOW (ref >60)
GLUCOSE: 174 mg/dL — AB (ref 65–99)
Potassium: 4 mmol/L (ref 3.5–5.1)
Sodium: 130 mmol/L — ABNORMAL LOW (ref 135–145)

## 2018-03-04 LAB — LACTATE DEHYDROGENASE: LDH: 129 U/L (ref 98–192)

## 2018-03-04 LAB — PROTIME-INR
INR: 2.03
Prothrombin Time: 22.8 seconds — ABNORMAL HIGH (ref 11.4–15.2)

## 2018-03-04 LAB — COOXEMETRY PANEL
Carboxyhemoglobin: 1.9 % — ABNORMAL HIGH (ref 0.5–1.5)
Methemoglobin: 1.5 % (ref 0.0–1.5)
O2 Saturation: 60.2 %
Total hemoglobin: 9.1 g/dL — ABNORMAL LOW (ref 12.0–16.0)

## 2018-03-04 LAB — MAGNESIUM: Magnesium: 2.5 mg/dL — ABNORMAL HIGH (ref 1.7–2.4)

## 2018-03-04 MED ORDER — WARFARIN SODIUM 7.5 MG PO TABS
7.5000 mg | ORAL_TABLET | Freq: Once | ORAL | Status: AC
  Administered 2018-03-04: 7.5 mg via ORAL
  Filled 2018-03-04: qty 1

## 2018-03-04 NOTE — Progress Notes (Signed)
Patient ID: Ronald Miller., male   DOB: [birth year], 62 y.o.   MRN: [id number]   Advanced Heart Failure VAD Team Note  PCP-Cardiologist: Glori Bickers, MD   Subjective:    Remains on dobutamine 7.5 mcg/kg/min and Lasix gtt at 20.  I/Os even yesterday.  CVP not set up.  He feels ok, no complaints.   Wound culture from chest shows moderate staph aureus.   S/P Paracentesis 5/23 -->5.6 liters removed.   LVAD INTERROGATION:  HeartMate III LVAD:   Flow 5.6 liters/min, speed 5800, power 4.5, PI 3.0. 2 PI events/24 hrs. Personally reviewed.    Objective:    Vital Signs:   Temp:  [97.1 F (36.2 C)-98.7 F (37.1 C)] 98.7 F (37.1 C) (06/01 0755) Pulse Rate:  [76-81] 80 (06/01 1023) Resp:  [14-21] 21 (06/01 0324) BP: (75-98)/(50-76) 83/58 (06/01 0752) SpO2:  [97 %-99 %] 98 % (06/01 0324) Weight:  [191 lb 9.3 oz (86.9 kg)] 191 lb 9.3 oz (86.9 kg) (06/01 0324) Last BM Date: 03/03/18 Mean arterial Pressure 70s-80s Intake/Output:   Intake/Output Summary (Last 24 hours) at 03/04/2018 1119 Last data filed at 03/04/2018 0900 Gross per 24 hour  Intake 1791.91 ml  Output 1225 ml  Net 566.91 ml     Physical Exam   GENERAL: Well appearing this am. NAD.  HEENT: Normal. NECK: Supple, JVP 16 cm. Carotids OK.  CARDIAC:  Mechanical heart sounds with LVAD hum present.  LUNGS:  Slightly decreased BS at bases.  ABDOMEN:  NT, ND, no HSM. No bruits or masses. +BS  LVAD exit site: Well-healed and incorporated. Dressing dry and intact. No erythema or drainage. Stabilization device present and accurately applied. Driveline dressing changed daily per sterile technique. EXTREMITIES:  Warm and dry. No cyanosis, clubbing, rash, or edema.  NEUROLOGIC:  Alert & oriented x 3. Cranial nerves grossly intact. Moves all 4 extremities w/o difficulty. Affect pleasant    Telemetry   A-V sequential pacing in 80s, personally reviewed.   EKG    No new tracings.   Labs   Basic Metabolic Panel: Recent  Labs  Lab 02/28/18 0606 03/01/18 0439 03/02/18 0425 03/02/18 2232 03/03/18 0440 03/04/18 0345  NA 131* 132* 131* 132* 130* 130*  K 3.3* 4.0 2.9* 3.4* 3.5 4.0  CL 86* 87* 84* 89* 90* 91*  CO2 33* 35* 34* 32 32 31  GLUCOSE 175* 157* 169* 198* 166* 174*  BUN 21* 22* 25* 27* 28* 31*  CREATININE 1.25* 1.43* 1.38* 1.33* 1.34* 1.41*  CALCIUM 8.6* 8.8* 8.8* 8.8* 8.6* 8.4*  MG 2.2 2.2 2.2  --  2.4 2.5*    Liver Function Tests: No results for input(s): AST, ALT, ALKPHOS, BILITOT, PROT, ALBUMIN in the last 168 hours. No results for input(s): LIPASE, AMYLASE in the last 168 hours. Recent Labs  Lab 02/27/18 0500  AMMONIA 28    CBC: Recent Labs  Lab 02/28/18 0606 03/01/18 0439 03/02/18 0425 03/03/18 0440 03/04/18 0345  WBC 6.9 6.9 6.5 6.6 6.7  HGB 8.3* 8.7* 8.5* 8.5* 8.2*  HCT 29.5* 30.3* 29.8* 30.0* 29.4*  MCV 77.4* 77.1* 76.8* 77.5* 78.2  PLT 208 209 219 219 210    INR: Recent Labs  Lab 02/28/18 0606 03/01/18 0439 03/02/18 0425 03/03/18 0440 03/04/18 0345  INR 2.27 2.15 2.05 2.04 2.03    Other results:     Imaging   No results found.   Medications:     Scheduled Medications: . acetaZOLAMIDE  500 mg Oral Q12H  .  amiodarone  200 mg Oral Daily  . citalopram  20 mg Oral Daily  . digoxin  0.125 mg Oral Daily  . doxycycline  100 mg Oral Q12H  . gabapentin  300 mg Oral BID  . insulin aspart  0-9 Units Subcutaneous TID WC  . lactulose  45 g Oral TID  . levothyroxine  50 mcg Oral QAC breakfast  . magnesium oxide  400 mg Oral Daily  . midodrine  10 mg Oral TID WC  . multivitamin with minerals  1 tablet Oral Daily  . pantoprazole  40 mg Oral Daily  . potassium chloride  40 mEq Oral Daily  . spironolactone  25 mg Oral Daily  . sucralfate  1 g Oral TID WC & HS  . warfarin  7.5 mg Oral ONCE-1800  . Warfarin - Pharmacist Dosing Inpatient   Does not apply q1800    Infusions: . DOBUTamine 7.5 mcg/kg/min (03/03/18 1735)  . furosemide (LASIX) infusion 20  mg/hr (03/04/18 0637)    PRN Medications: acetaminophen, cyclobenzaprine, docusate sodium, ondansetron, senna-docusate, traZODone   Patient Profile  Ronald Milleris a 62 y.o.male with history of chronic systolic HF s/p Medtronic ICD 2014, CAD s/p CABG in 2000, HTN, Hx of cocaine abuse, Tobacco abuse, Depression, PAF, and COPD. HM3 placed 09/09/2016. In April 2018 he was placed on milrinone but later switched to dobutamine. He remains on dobutamine 7.3mcg  Admitted 02/23/18 with marked volume overload.    Assessment/Plan:     1.Acute/Chronic end-stage biventricular systolic QZ:RAQT 62% due to ICM ->s/p Echo 08/24/16 LVEF 15%, RV mild dilated, moderately reduced. -->S/P HM3 LVAD 09/09/2016. ICD at EOL.Has been turned down for transplant at [facility].  He has significant RV failure and has been on home dobutamine.  Still volume overloaded by exam, this has been fairly intractable. Co-ox 60% today. MAP stable. VAD parameters stable. LDH 129.  - Continue dobutamine 7.5 mcg.  - Continue same Lasix gtt + acetazolamide until he is able to go to SNF, at that point, resume torsemide.  - Continue midodrine 10 TID for low MAPs and cirrhotic physiology. - Off sildenafil due to hypotension - No beta blocker with RV failure.  - Warfarin with goal INR 1.8-2.3. - Not transplant candidate due to social issues, noncompliance and cirrhosis (has been turned down by [facility]) - Continue digoxin. Dig level 0.8 5/29 2. CAD: Severe 3v- CAD s/p CABG with occluded grafts as above except for LIMA.  3. DMII: Continue SSI 4. Tobacco abuse: Reports complete cessation. No change.  5. Atrial fibrillation, paroxysmal: He is A-V pacing.   - Continue amiodarone.  6. Anxiety/Depression - Continue Celexa 20mg  daily . - Palliative Care consult. Open to long term placement at [facility]. No change.  7. Cirrhosis with hepatic encephalopathy: Likely combination ETOH and chronic RV failure. No asterixis. -S/P Paracentesis  5/23 with 5.6 liters removed.  - Continue lactulose at 45 tid. No change.  8. Anemia, iron-deficiency: Has had GI work up with EGD and Capsule Endoscopy showing severe gastropathy. No evidence of active bleeding. Continue carafate. - Hgb 8.2 today. No evidence for active bleeding. 9. GOC: Palliative Care consulted. He is DNR/DNI. Plans for long term care at [facility].  10. Wound on chest: WOC consulted. Wound culture from chest shows moderate staph aureus.  - Continue doxy through 6/4.  Plan for d/c to SNF but need peer-to-peer with insurance.  We will not be able to do this until Monday, so he will stay through the weekend.  Length of Stay: 90  Loralie Champagne, MD 03/04/2018, 11:19 AM  VAD Team --- VAD ISSUES ONLY--- Pager [phone] (7am - 7am)  Advanced Heart Failure Team  Pager [phone] (M-F; 7a - 4p)  Please contact [facility] for night-coverage after hours (4p -7a ) and weekends on [email or web address]

## 2018-03-04 NOTE — Progress Notes (Signed)
ANTICOAGULATION CONSULT NOTE - [place] for warfarin Indication: LVAD  Allergies  Allergen Reactions  . Chlorhexidine Gluconate Itching and Rash  . Codeine Nausea And Vomiting and Other (See Comments)    In high doses  . Lipitor [Atorvastatin] Nausea Only and Other (See Comments)    Nausea with high doses, tolerates 20mg  dose (08/22/16)  . Tape Other (See Comments)    Paper tape is ok    Patient Measurements: Height: 5\' 9"  (175.3 cm) Weight: 191 lb 9.3 oz (86.9 kg) IBW/kg (Calculated) : 70.7  Vital Signs: Temp: 98.7 F (37.1 C) (06/01 0755) Temp Source: Oral (06/01 0755) BP: 83/58 (06/01 0752) Pulse Rate: 80 (06/01 0324)  Labs: Recent Labs    03/02/18 0425 03/02/18 2232 03/03/18 0440 03/04/18 0345  HGB 8.5*  --  8.5* 8.2*  HCT 29.8*  --  30.0* 29.4*  PLT 219  --  219 210  LABPROT 23.0*  --  22.8* 22.8*  INR 2.05  --  2.04 2.03  CREATININE 1.38* 1.33* 1.34* 1.41*    Estimated Creatinine Clearance: 60.1 mL/min (A) (by C-G formula based on SCr of 1.41 mg/dL (H)).   Medical History: Past Medical History:  Diagnosis Date  . AICD (automatic cardioverter/defibrillator) present   . Anxiety   . ASCVD (arteriosclerotic cardiovascular disease)   . Chronic systolic CHF (congestive heart failure) ([place])   . COPD (chronic obstructive pulmonary disease) ([place])   . Coronary artery disease   . Depression   . GI bleed   . High cholesterol   . History of blood transfusion   . History of cocaine abuse   . Hypertension   . LVAD (left ventricular assist device) present ([place])   . Presence of permanent cardiac pacemaker   . Shortness of breath dyspnea   . Suicidal ideation   . Tobacco abuse   . Type II diabetes mellitus (HCC)      Assessment: 67 yoM s/p HM3 LVAD on warfarin PTA. INR therapeutic at 2.0, LDH stable, Hgb stable, no S/Sx bleeding. Note: levothyroxine starting which may increase warfarin effects and doxycycline will be added for 7  days.  At discharge, recommend warfarin 7.5 mg daily except 5 mg Mon/Fri with INR F/U early next week).  PTA Dose = 5mg  Mon/Fri, 7.5mg  all other days  Goal of Therapy:  INR 1.8-2.4 Monitor platelets by anticoagulation protocol: Yes   Plan:  -Warfarin 7.5 mg PO x1 tonight -Daily PT/INR  Erin Hearing PharmD., BCPS Clinical Pharmacist 03/04/2018 9:26 AM

## 2018-03-04 NOTE — Progress Notes (Signed)
Lasix gtt stopped per coordinator/MD. Will contact coordinator back within 46mins to update on BP.   [place]  Vestal Crandall, RN

## 2018-03-04 NOTE — Progress Notes (Signed)
LVAD Coordinator called about low automatic/doppler BP/MAP. May need to alter gtt's; will continue to monitor. Coordinator will speak with MD about potential new orders.   [place]  Torrey Horseman, RN

## 2018-03-04 NOTE — Progress Notes (Signed)
Repeat BP 76/61 with doppler of 68. Will let VAD coordinator know.   [place]  Rasheen Schewe, RN

## 2018-03-05 LAB — BASIC METABOLIC PANEL
ANION GAP: 9 (ref 5–15)
BUN: 34 mg/dL — ABNORMAL HIGH (ref 6–20)
CALCIUM: 8.7 mg/dL — AB (ref 8.9–10.3)
CO2: 29 mmol/L (ref 22–32)
Chloride: 90 mmol/L — ABNORMAL LOW (ref 101–111)
Creatinine, Ser: 1.36 mg/dL — ABNORMAL HIGH (ref 0.61–1.24)
GFR calc Af Amer: >60 mL/min (ref >60)
GFR, EST NON AFRICAN AMERICAN: 55 mL/min — AB (ref >60)
GLUCOSE: 184 mg/dL — AB (ref 65–99)
POTASSIUM: 3.9 mmol/L (ref 3.5–5.1)
Sodium: 128 mmol/L — ABNORMAL LOW (ref 135–145)

## 2018-03-05 LAB — COOXEMETRY PANEL
Carboxyhemoglobin: 1.7 % — ABNORMAL HIGH (ref 0.5–1.5)
Carboxyhemoglobin: 2.1 % — ABNORMAL HIGH (ref 0.5–1.5)
Methemoglobin: 1.6 % — ABNORMAL HIGH (ref 0.0–1.5)
Methemoglobin: 1 % (ref 0.0–1.5)
O2 Saturation: 48.1 %
O2 Saturation: 52.2 %
Total hemoglobin: 8 g/dL — ABNORMAL LOW (ref 12.0–16.0)
Total hemoglobin: 9.8 g/dL — ABNORMAL LOW (ref 12.0–16.0)

## 2018-03-05 LAB — CBC
HEMATOCRIT: 26.7 % — AB (ref 39.0–52.0)
Hemoglobin: 7.6 g/dL — ABNORMAL LOW (ref 13.0–17.0)
MCH: 22 pg — ABNORMAL LOW (ref 26.0–34.0)
MCHC: 28.5 g/dL — ABNORMAL LOW (ref 30.0–36.0)
MCV: 77.2 fL — AB (ref 78.0–100.0)
PLATELETS: 204 10*3/uL (ref 150–400)
RBC: 3.46 MIL/uL — AB (ref 4.22–5.81)
RDW: 17.6 % — ABNORMAL HIGH (ref 11.5–15.5)
WBC: 7.4 10*3/uL (ref 4.0–10.5)

## 2018-03-05 LAB — PROTIME-INR
INR: 2.02
PROTHROMBIN TIME: 22.7 s — AB (ref 11.4–15.2)

## 2018-03-05 LAB — GLUCOSE, CAPILLARY
GLUCOSE-CAPILLARY: 216 mg/dL — AB (ref 65–99)
GLUCOSE-CAPILLARY: 211 mg/dL — AB (ref 65–99)
Glucose-Capillary: 190 mg/dL — ABNORMAL HIGH (ref 65–99)
Glucose-Capillary: 204 mg/dL — ABNORMAL HIGH (ref 65–99)

## 2018-03-05 LAB — MAGNESIUM: Magnesium: 2.7 mg/dL — ABNORMAL HIGH (ref 1.7–2.4)

## 2018-03-05 LAB — LACTATE DEHYDROGENASE: LDH: 118 U/L (ref 98–192)

## 2018-03-05 LAB — AMMONIA: AMMONIA: 59 umol/L — AB (ref 9–35)

## 2018-03-05 LAB — PREPARE RBC (CROSSMATCH)

## 2018-03-05 MED ORDER — WARFARIN SODIUM 5 MG PO TABS: 5.0000 mg | ORAL_TABLET | ORAL | Status: DC

## 2018-03-05 MED ORDER — WARFARIN SODIUM 7.5 MG PO TABS
7.5000 mg | ORAL_TABLET | ORAL | Status: DC
  Administered 2018-03-05: 7.5 mg via ORAL
  Filled 2018-03-05: qty 1

## 2018-03-05 MED ORDER — WARFARIN SODIUM 7.5 MG PO TABS: 7.5000 mg | ORAL_TABLET | ORAL | Status: DC

## 2018-03-05 MED ORDER — SODIUM CHLORIDE 0.9 % IV SOLN: Freq: Once | INTRAVENOUS | Status: DC

## 2018-03-05 NOTE — Progress Notes (Signed)
Contacted VAD coordinator on call for battery on controller defaulted warning. Yellow wrench lit up and Wall/Unit monitor reads battery defaulted as well. Clarise Cruz stated the battery warning will not stop the LVAD from working and she would change this battery on Monday when she comes in. She stated we don't have the 11 volt Lithium batteries on the floor and she knows where they are in the CHF clinic. She also stated the controller will alarm every four hours and we can turn it off. She is aware of the problem and will correct it on Monday. Will continue to monitor the patient.

## 2018-03-05 NOTE — Progress Notes (Signed)
ANTICOAGULATION CONSULT NOTE - [place] for warfarin Indication: LVAD  Allergies  Allergen Reactions  . Chlorhexidine Gluconate Itching and Rash  . Codeine Nausea And Vomiting and Other (See Comments)    In high doses  . Lipitor [Atorvastatin] Nausea Only and Other (See Comments)    Nausea with high doses, tolerates 20mg  dose (08/22/16)  . Tape Other (See Comments)    Paper tape is ok    Patient Measurements: Height: 5\' 9"  (175.3 cm) Weight: 195 lb 1.7 oz (88.5 kg) IBW/kg (Calculated) : 70.7  Vital Signs: Temp: 98 F (36.7 C) (06/02 1100) Temp Source: Oral (06/02 1100) BP: 89/74 (06/02 1100) Pulse Rate: 76 (06/02 1100)  Labs: Recent Labs    03/03/18 0440 03/04/18 0345 03/05/18 0500 03/05/18 0744  HGB 8.5* 8.2*  --  7.6*  HCT 30.0* 29.4*  --  26.7*  PLT 219 210  --  204  LABPROT 22.8* 22.8* 22.7*  --   INR 2.04 2.03 2.02  --   CREATININE 1.34* 1.41*  --  1.36*    Estimated Creatinine Clearance: 62.8 mL/min (A) (by C-G formula based on SCr of 1.36 mg/dL (H)).   Medical History: Past Medical History:  Diagnosis Date  . AICD (automatic cardioverter/defibrillator) present   . Anxiety   . ASCVD (arteriosclerotic cardiovascular disease)   . Chronic systolic CHF (congestive heart failure) ([place])   . COPD (chronic obstructive pulmonary disease) ([place])   . Coronary artery disease   . Depression   . GI bleed   . High cholesterol   . History of blood transfusion   . History of cocaine abuse   . Hypertension   . LVAD (left ventricular assist device) present ([place])   . Presence of permanent cardiac pacemaker   . Shortness of breath dyspnea   . Suicidal ideation   . Tobacco abuse   . Type II diabetes mellitus (HCC)      Assessment: 43 yoM s/p HM3 LVAD on warfarin PTA. INR therapeutic at 2.0, LDH stable, Hgb drop slightly overnight plan 1 ut PRBC, no S/Sx bleeding. Note: levothyroxine starting which may increase warfarin effects and  doxycycline will be added for 7 days - staph aureus - pan sensitive drive line infection  At discharge, recommend warfarin 7.5 mg daily except 5 mg Mon/Fri with INR F/U early next week).  PTA Dose = 5mg  Mon/Fri, 7.5mg  all other days  Goal of Therapy:  INR 1.8-2.4 Monitor platelets by anticoagulation protocol: Yes   Plan:  -Warfarin 7.5 mg PO qd except 5mg  MF -Daily PT/INR  Bonnita Nasuti Pharm.D. CPP, BCPS Clinical Pharmacist [phone] 03/05/2018 12:51 PM

## 2018-03-05 NOTE — Progress Notes (Signed)
Patient ID: Ronald Miller., male   DOB: 13-Feb-1956, 62 y.o.   MRN: [id number]   Advanced Heart Failure VAD Team Note  PCP-Cardiologist: Glori Bickers, MD   Subjective:    Remains on dobutamine 7.5 mcg/kg/min.  Lasix gtt held yesterday with fall in MAP.  MAP has been in 70s overnight and today. I/Os negative still but weight up.  CVP 19, co-ox lower today at 48%.   Hemoglobin down to 7.6.  No overt GI bleeding.   Wound culture from chest showed moderate staph aureus.   S/P Paracentesis 5/23 -->5.6 liters removed.   LVAD INTERROGATION:  HeartMate III LVAD:   Flow 5.5 liters/min, speed 5800, power 4.4, PI 3.0. 3 PI events/24 hrs. Personally reviewed.    Objective:    Vital Signs:   Temp:  [97.4 F (36.3 C)-98.4 F (36.9 C)] 98 F (36.7 C) (06/02 0800) Pulse Rate:  [73-80] 80 (06/02 0800) Resp:  [14-19] 19 (06/02 0800) BP: (65-88)/(46-73) 88/70 (06/02 0800) SpO2:  [97 %-99 %] 99 % (06/02 0800) Weight:  [195 lb 1.7 oz (88.5 kg)] 195 lb 1.7 oz (88.5 kg) (06/02 0600) Last BM Date: 03/04/18 Mean arterial Pressure 70s Intake/Output:   Intake/Output Summary (Last 24 hours) at 03/05/2018 1046 Last data filed at 03/05/2018 0900 Gross per 24 hour  Intake 1878 ml  Output 3125 ml  Net -1247 ml     Physical Exam   GENERAL: Well appearing this am. NAD.  HEENT: Normal. NECK: Supple, JVP 14+ cm. Carotids OK.  CARDIAC:  Mechanical heart sounds with LVAD hum present.  LUNGS:  Decreased BS at bases.   ABDOMEN:  NT, soft with mild to moderate distention, no HSM. No bruits or masses. +BS  LVAD exit site: Well-healed and incorporated. Dressing dry and intact. No erythema or drainage. Stabilization device present and accurately applied. Driveline dressing changed daily per sterile technique. EXTREMITIES:  Warm and dry. No cyanosis, clubbing, rash, or edema.  NEUROLOGIC:  Alert & oriented x 3. Cranial nerves grossly intact. Moves all 4 extremities w/o difficulty. Affect pleasant       Telemetry   A-V sequential pacing in 80s, personally reviewed.   EKG    No new tracings.   Labs   Basic Metabolic Panel: Recent Labs  Lab 03/01/18 0439 03/02/18 0425 03/02/18 2232 03/03/18 0440 03/04/18 0345 03/05/18 0500 03/05/18 0744  NA 132* 131* 132* 130* 130*  --  128*  K 4.0 2.9* 3.4* 3.5 4.0  --  3.9  CL 87* 84* 89* 90* 91*  --  90*  CO2 35* 34* 32 32 31  --  29  GLUCOSE 157* 169* 198* 166* 174*  --  184*  BUN 22* 25* 27* 28* 31*  --  34*  CREATININE 1.43* 1.38* 1.33* 1.34* 1.41*  --  1.36*  CALCIUM 8.8* 8.8* 8.8* 8.6* 8.4*  --  8.7*  MG 2.2 2.2  --  2.4 2.5* 2.7*  --     Liver Function Tests: No results for input(s): AST, ALT, ALKPHOS, BILITOT, PROT, ALBUMIN in the last 168 hours. No results for input(s): LIPASE, AMYLASE in the last 168 hours. Recent Labs  Lab 02/27/18 0500  AMMONIA 28    CBC: Recent Labs  Lab 03/01/18 0439 03/02/18 0425 03/03/18 0440 03/04/18 0345 03/05/18 0744  WBC 6.9 6.5 6.6 6.7 7.4  HGB 8.7* 8.5* 8.5* 8.2* 7.6*  HCT 30.3* 29.8* 30.0* 29.4* 26.7*  MCV 77.1* 76.8* 77.5* 78.2 77.2*  PLT 209 219 219 210  204    INR: Recent Labs  Lab 03/01/18 0439 03/02/18 0425 03/03/18 0440 03/04/18 0345 03/05/18 0500  INR 2.15 2.05 2.04 2.03 2.02    Other results:     Imaging   No results found.   Medications:     Scheduled Medications: . acetaZOLAMIDE  500 mg Oral Q12H  . amiodarone  200 mg Oral Daily  . citalopram  20 mg Oral Daily  . digoxin  0.125 mg Oral Daily  . doxycycline  100 mg Oral Q12H  . gabapentin  300 mg Oral BID  . insulin aspart  0-9 Units Subcutaneous TID WC  . lactulose  45 g Oral TID  . levothyroxine  50 mcg Oral QAC breakfast  . magnesium oxide  400 mg Oral Daily  . midodrine  10 mg Oral TID WC  . multivitamin with minerals  1 tablet Oral Daily  . pantoprazole  40 mg Oral Daily  . potassium chloride  40 mEq Oral Daily  . spironolactone  25 mg Oral Daily  . sucralfate  1 g Oral TID WC & HS   . Warfarin - Pharmacist Dosing Inpatient   Does not apply q1800    Infusions: . sodium chloride    . DOBUTamine 7.5 mcg/kg/min (03/05/18 0900)  . furosemide (LASIX) infusion Stopped (03/04/18 1630)    PRN Medications: acetaminophen, cyclobenzaprine, docusate sodium, ondansetron, senna-docusate, traZODone   Patient Profile  Ronald Milleris a 62 y.o.male with history of chronic systolic HF s/p Medtronic ICD 2014, CAD s/p CABG in 2000, HTN, Hx of cocaine abuse, Tobacco abuse, Depression, PAF, and COPD. HM3 placed 09/09/2016. In April 2018 he was placed on milrinone but later switched to dobutamine. He remains on dobutamine 7.38mcg  Admitted 02/23/18 with marked volume overload.    Assessment/Plan:     1.Acute/Chronic end-stage biventricular systolic EY:CXKG 81% due to ICM ->s/p Echo 08/24/16 LVEF 15%, RV mild dilated, moderately reduced. -->S/P HM3 LVAD 09/09/2016. ICD at EOL.Has been turned down for transplant at [facility].  He has significant RV failure and has been on home dobutamine.  Still volume overloaded by exam, this has been fairly intractable. Lasix held yesterday with low MAP, now back up in 70s.  Co-ox low this morning at 48%, hgb also lower. CVP 19 today.   - Continue dobutamine 7.5 mcg.  - Restart Lasix 20 mg/hr with acetazolamide until he is able to go to SNF.  At that point, resume torsemide.  - Will give a unit of PRBCs and repeat co-ox, hopefully will be better.  - Continue midodrine 10 TID for low MAPs and cirrhotic physiology. - Off sildenafil due to hypotension - No beta blocker with RV failure.  - Warfarin with goal INR 1.8-2.3. - Not transplant candidate due to social issues, noncompliance and cirrhosis (has been turned down by [facility]) - Continue digoxin. Dig level 0.8 5/29 2. CAD: Severe 3v- CAD s/p CABG with occluded grafts as above except for LIMA.  3. DMII: Continue SSI 4. Tobacco abuse: Reports complete cessation. No change.  5. Atrial fibrillation,  paroxysmal: He is A-V pacing.   - Continue amiodarone.  6. Anxiety/Depression - Continue Celexa 20mg  daily . - Palliative Care consult. Open to long term placement at [facility]. No change.  7. Cirrhosis with hepatic encephalopathy: Likely combination ETOH and chronic RV failure. No asterixis.  S/P Paracentesis 5/23 with 5.6 liters removed.  - Continue lactulose at 45 tid. No change.  8. Anemia, iron-deficiency: Has had GI work up with EGD  and Capsule Endoscopy showing severe gastropathy. No evidence of active bleeding. Continue carafate. - Hgb down to 7.6 today. Will give 1 unit PRBCs today.  9. GOC: Palliative Care consulted. He is DNR/DNI. Plans for long term care at [facility].  10. Wound on chest: WOC consulted. Wound culture from chest shows moderate staph aureus.  - Continue doxy through 6/4.  Plan for d/c to SNF but need peer-to-peer with insurance.  We will not be able to do this until Monday, so he will stay through the weekend.   Length of Stay: [place], MD 03/05/2018, 10:46 AM  VAD Team --- VAD ISSUES ONLY--- Pager [phone] (7am - 7am)  Advanced Heart Failure Team  Pager [phone] (M-F; 7a - 4p)  Please contact [facility] for night-coverage after hours (4p -7a ) and weekends on [email or web address]

## 2018-03-06 ENCOUNTER — Encounter (HOSPITAL_COMMUNITY): Payer: Self-pay

## 2018-03-06 LAB — TYPE AND SCREEN
ABO/RH(D): O POS
Antibody Screen: NEGATIVE
UNIT DIVISION: 0

## 2018-03-06 LAB — COOXEMETRY PANEL
CARBOXYHEMOGLOBIN: 2.2 % — AB (ref 0.5–1.5)
Methemoglobin: 0.8 % (ref 0.0–1.5)
O2 SAT: 59.5 %
Total hemoglobin: 8.4 g/dL — ABNORMAL LOW (ref 12.0–16.0)

## 2018-03-06 LAB — PROTIME-INR
INR: 2.01
PROTHROMBIN TIME: 22.6 s — AB (ref 11.4–15.2)

## 2018-03-06 LAB — CBC
HCT: 28.1 % — ABNORMAL LOW (ref 39.0–52.0)
Hemoglobin: 8.1 g/dL — ABNORMAL LOW (ref 13.0–17.0)
MCH: 22.4 pg — ABNORMAL LOW (ref 26.0–34.0)
MCHC: 28.8 g/dL — ABNORMAL LOW (ref 30.0–36.0)
MCV: 77.8 fL — ABNORMAL LOW (ref 78.0–100.0)
PLATELETS: 194 10*3/uL (ref 150–400)
RBC: 3.61 MIL/uL — ABNORMAL LOW (ref 4.22–5.81)
RDW: 17.6 % — AB (ref 11.5–15.5)
WBC: 7 10*3/uL (ref 4.0–10.5)

## 2018-03-06 LAB — LACTATE DEHYDROGENASE: LDH: 118 U/L (ref 98–192)

## 2018-03-06 LAB — BASIC METABOLIC PANEL
ANION GAP: 5 (ref 5–15)
BUN: 33 mg/dL — ABNORMAL HIGH (ref 6–20)
CO2: 29 mmol/L (ref 22–32)
Calcium: 8 mg/dL — ABNORMAL LOW (ref 8.9–10.3)
Chloride: 96 mmol/L — ABNORMAL LOW (ref 101–111)
Creatinine, Ser: 1.26 mg/dL — ABNORMAL HIGH (ref 0.61–1.24)
GFR, EST NON AFRICAN AMERICAN: 60 mL/min — AB (ref >60)
Glucose, Bld: 178 mg/dL — ABNORMAL HIGH (ref 65–99)
Potassium: 3.5 mmol/L (ref 3.5–5.1)
SODIUM: 130 mmol/L — AB (ref 135–145)

## 2018-03-06 LAB — MAGNESIUM: MAGNESIUM: 2.3 mg/dL (ref 1.7–2.4)

## 2018-03-06 LAB — BPAM RBC
Blood Product Expiration Date: 201906052359
ISSUE DATE / TIME: 201906021242
Unit Type and Rh: 9500

## 2018-03-06 LAB — GLUCOSE, CAPILLARY
Glucose-Capillary: 160 mg/dL — ABNORMAL HIGH (ref 65–99)
Glucose-Capillary: 197 mg/dL — ABNORMAL HIGH (ref 65–99)

## 2018-03-06 MED ORDER — METOLAZONE 5 MG PO TABS
5.0000 mg | ORAL_TABLET | Freq: Once | ORAL | Status: AC
  Administered 2018-03-06: 5 mg via ORAL
  Filled 2018-03-06: qty 1

## 2018-03-06 MED ORDER — POTASSIUM CHLORIDE CRYS ER 20 MEQ PO TBCR
20.0000 meq | EXTENDED_RELEASE_TABLET | Freq: Every day | ORAL | Status: DC
  Administered 2018-03-06: 20 meq via ORAL
  Filled 2018-03-06: qty 1

## 2018-03-06 NOTE — Progress Notes (Addendum)
ANTICOAGULATION CONSULT NOTE - [place] for warfarin Indication: LVAD  Allergies  Allergen Reactions  . Chlorhexidine Gluconate Itching and Rash  . Codeine Nausea And Vomiting and Other (See Comments)    In high doses  . Lipitor [Atorvastatin] Nausea Only and Other (See Comments)    Nausea with high doses, tolerates 20mg  dose (08/22/16)  . Tape Other (See Comments)    Paper tape is ok    Patient Measurements: Height: 5\' 9"  (175.3 cm) Weight: 196 lb 3.4 oz (89 kg) IBW/kg (Calculated) : 70.7  Vital Signs: Temp: 99 F (37.2 C) (06/03 0700) Temp Source: Oral (06/03 0700) BP: 79/69 (06/03 0700) Pulse Rate: 81 (06/03 0700)  Labs: Recent Labs    03/04/18 0345 03/05/18 0500 03/05/18 0744 03/06/18 0500  HGB 8.2*  --  7.6* 8.1*  HCT 29.4*  --  26.7* 28.1*  PLT 210  --  204 194  LABPROT 22.8* 22.7*  --  22.6*  INR 2.03 2.02  --  2.01  CREATININE 1.41*  --  1.36* 1.26*    Estimated Creatinine Clearance: 67.9 mL/min (A) (by C-G formula based on SCr of 1.26 mg/dL (H)).   Medical History: Past Medical History:  Diagnosis Date  . AICD (automatic cardioverter/defibrillator) present   . Anxiety   . ASCVD (arteriosclerotic cardiovascular disease)   . Chronic systolic CHF (congestive heart failure) ([place])   . COPD (chronic obstructive pulmonary disease) ([place])   . Coronary artery disease   . Depression   . GI bleed   . High cholesterol   . History of blood transfusion   . History of cocaine abuse   . Hypertension   . LVAD (left ventricular assist device) present ([place])   . Presence of permanent cardiac pacemaker   . Shortness of breath dyspnea   . Suicidal ideation   . Tobacco abuse   . Type II diabetes mellitus (HCC)      Assessment: 17 yoM s/p HM3 LVAD on warfarin PTA. INR stable and therapeutic at 2.01, LDH stable. Hgb 8.1, plt 194. Received 1 unit PRBC yesterday. No signs/symptoms of bleeding.   Note: levothyroxine starting which may  increase warfarin effects and doxycycline will be added for 7 days - staph aureus - pan sensitive drive line infection  At discharge, recommend warfarin 7.5 mg daily except 5 mg Mon/Fri with INR F/U early next week.  PTA Dose = 5mg  Mon/Fri, 7.5mg  all other days  Goal of Therapy:  INR 1.8-2.4 Monitor platelets by anticoagulation protocol: Yes   Plan:  -Continue home regimen of warfarin 5 mg tonight  -Daily PT/INR  Doylene Canard, PharmD Clinical Pharmacist  Pager: [phone] Phone: [phone] 03/06/2018 10:56 AM

## 2018-03-06 NOTE — Progress Notes (Signed)
LVAD Coordinator Rounding Note:  Admitted 02/23/18 to Dr. Haroldine Laws due to ascites and CHF with fluid overload.   HM III LVAD implanted on 09/09/16 by Dr. Cyndia Bent under Destination Therapy criteria.  Pt sitting on beside eating breakfast. Denies complaints this am.   Vital signs: Temp:  99.0 HR: 81 Doppler Pressure: 65 Automatic BP: 79/69 (74) O2 Sat: 98 % on RA Wt: 222>203>198>194>190>191>195>196 lbs  LVAD interrogation reveals:  Speed: 5800 Flow: 5.5 Power: 4.5 w PI:  3.0 Alarms: two "replace back up battery" advisories on 03/04/18 Events: rare PI events Hematocrit:  28 Fixed speed:  5800 Low speed limit: 5500  Primary Controller: Replace back up battery in 19 months. Back up controller: Replace back up battery in57months.  Drive Line: left abdominal sorbaview dressing dry and intact; anchor intact and accurately applied. Change weekly; due 03/09/18 . Driveline anchored and secured. Pt has an intolerance to CHG please use saline and betadine only.  Labs:  LDH trend: 156>128>138>125>142>129>118>118  INR trend: 2.61>2.27>2.15>2.05>2.04>2.03>2.02>2.01  Gtts: Dobutamine 7.5 mcg/kg/min Lasix 20 mg/hr  Anticoagulation Plan: -INR Goal:  1.8 - 2.3 -ASA Dose: no ASA (history of GI bleeding)  Device: -Protect; end of service 08/2016 -Therapies:  Off Last check: complete interrogation 05/26/2017. DDD (lower rate 60); BiV pacing with 96% AS-VP  Adverse Events on VAD: 11/30/16> Milrinone gtt at discharge 01/27/17>dobutamine at 5 mcg/kg/mingtt at discharge 03/08/17> RV failure 04/29/17> symptomatic anemia and melena. EGD revealed stomach ulcers. Carafate added; ASA stopped 05/31/17>Ongoing symptomatic anemia and melena; capsule study mild antral gastritis; poss tiny AVM right colon. Lower MAPS, Losartan cut back. 06/2017> admit with syncope, AMS, Dobutamine at 7.64mcg 11/09/17>Marked ascites, RV failure, and hepatic encephalopathy. Paracentesis on 2/7 and 2/12 with >11 liters fluid  removed 12/27/17>Outpatient paracentesis with 2.8 liters removed 01/11/18>IP paracentesis with 4.8 liters removed 01/16/18>IP paracentesis w/2.4L removed 02/15/18>OP paracentesis w/ 5 L removed 02/23/18>IP paracentesis w/ 5.6 L removed   Plan/Recommendations:  1. Discharging to [facility] pending insurance approval. 2. Please call VAD pager with any VAD equipment or drive line issues.  Zada Girt RN, VAD Coordiantor 24/7 VAD pager: [phone]

## 2018-03-06 NOTE — Progress Notes (Signed)
RN called report to receiving nurse, [place] at [facility]. Awaiting arrival of [facility].

## 2018-03-06 NOTE — Care Management Important Message (Signed)
Important Message  Patient Details  Name: Ronald Miller. MRN: [id number] Date of Birth: [birth year]   Medicare Important Message Given:  Yes    Delorse Lek 03/06/2018, 3:34 PM

## 2018-03-06 NOTE — Progress Notes (Addendum)
Patient ID: Ronald Mizuno., male   DOB: 13-Dec-1955, 62 y.o.   MRN: [id number]   Advanced Heart Failure VAD Team Note  PCP-Cardiologist: Ronald Bickers, MD   Subjective:    Remains on dobutamine 7.5 mcg/kg/min and Lasix gtt at 20 mg/hr. Modest UOP, but net positive 150 mls. Weight up 1 lb today (6 pounds over the weekend). Creatinine 1.26. Co-ox 59.5%. CVP 17  Received 1 unit PRBCs 03/05/18. Hemoglobin 8.1 this am. INR 2.01 No obvious bleeding.   Wound culture from chest showed moderate staph aureus. Has been on doxy.   S/P Paracentesis 5/23 -->5.6 liters removed.   LVAD INTERROGATION:  HeartMate III LVAD:   Flow 5.5 liters/min, speed 5800, power 5, PI 2.9. 1 PI events/24 hrs. Personally reviewed.    Objective:    Vital Signs:   Temp:  [97.4 F (36.3 C)-98.7 F (37.1 C)] 98.7 F (37.1 C) (06/03 0247) Pulse Rate:  [45-95] 73 (06/03 0247) Resp:  [13-20] 20 (06/03 0247) BP: (65-104)/(55-87) 89/76 (06/03 0247) SpO2:  [93 %-100 %] 99 % (06/03 0247) Weight:  [196 lb 3.4 oz (89 kg)] 196 lb 3.4 oz (89 kg) (06/03 0350) Last BM Date: 03/04/18 Mean arterial Pressure 70-80s Intake/Output:   Intake/Output Summary (Last 24 hours) at 03/06/2018 0705 Last data filed at 03/06/2018 0512 Gross per 24 hour  Intake 2502.01 ml  Output 2350 ml  Net 152.01 ml     Physical Exam   CVP: 17 GENERAL: Sitting up in the chair Well appearing this am. NAD.  HEENT: Normal. Anicteric NECK: Supple, JVP 17 cm. Carotids OK.  CARDIAC:  Mechanical heart sounds with LVAD hum present.  LUNGS:  CTAB, normal effort. No wheezing ABDOMEN:  Obese NT, ND, no HSM. No bruits or masses. +BS  LVAD exit site: Dressing dry and intact. No erythema or drainage. Stabilization device present and accurately applied. Driveline dressing changed daily per sterile technique. EXTREMITIES:  Warm and dry. No cyanosis, clubbing, rash, trace-1+ edema.  Neuro: alert & oriented x 3, cranial nerves grossly intact. moves all 4  extremities w/o difficulty. No asterixis.  Affect pleasant    Telemetry   AV paced. Personally reviewed.   EKG    No new tracings.   Labs   Basic Metabolic Panel: Recent Labs  Lab 03/02/18 0425 03/02/18 2232 03/03/18 0440 03/04/18 0345 03/05/18 0500 03/05/18 0744 03/06/18 0500  NA 131* 132* 130* 130*  --  128* 130*  K 2.9* 3.4* 3.5 4.0  --  3.9 3.5  CL 84* 89* 90* 91*  --  90* 96*  CO2 34* 32 32 31  --  29 29  GLUCOSE 169* 198* 166* 174*  --  184* 178*  BUN 25* 27* 28* 31*  --  34* 33*  CREATININE 1.38* 1.33* 1.34* 1.41*  --  1.36* 1.26*  CALCIUM 8.8* 8.8* 8.6* 8.4*  --  8.7* 8.0*  MG 2.2  --  2.4 2.5* 2.7*  --  2.3    Liver Function Tests: No results for input(s): AST, ALT, ALKPHOS, BILITOT, PROT, ALBUMIN in the last 168 hours. No results for input(s): LIPASE, AMYLASE in the last 168 hours. Recent Labs  Lab 03/05/18 1538  AMMONIA 59*    CBC: Recent Labs  Lab 03/02/18 0425 03/03/18 0440 03/04/18 0345 03/05/18 0744 03/06/18 0500  WBC 6.5 6.6 6.7 7.4 7.0  HGB 8.5* 8.5* 8.2* 7.6* 8.1*  HCT 29.8* 30.0* 29.4* 26.7* 28.1*  MCV 76.8* 77.5* 78.2 77.2* 77.8*  PLT 219 219  210 204 194    INR: Recent Labs  Lab 03/02/18 0425 03/03/18 0440 03/04/18 0345 03/05/18 0500 03/06/18 0500  INR 2.05 2.04 2.03 2.02 2.01    Other results:     Imaging   No results found.   Medications:     Scheduled Medications: . acetaZOLAMIDE  500 mg Oral Q12H  . amiodarone  200 mg Oral Daily  . citalopram  20 mg Oral Daily  . digoxin  0.125 mg Oral Daily  . doxycycline  100 mg Oral Q12H  . gabapentin  300 mg Oral BID  . insulin aspart  0-9 Units Subcutaneous TID WC  . lactulose  45 g Oral TID  . levothyroxine  50 mcg Oral QAC breakfast  . magnesium oxide  400 mg Oral Daily  . midodrine  10 mg Oral TID WC  . multivitamin with minerals  1 tablet Oral Daily  . pantoprazole  40 mg Oral Daily  . potassium chloride  40 mEq Oral Daily  . spironolactone  25 mg Oral  Daily  . sucralfate  1 g Oral TID WC & HS  . warfarin  5 mg Oral Once per day on Mon Fri  . warfarin  7.5 mg Oral Once per day on Sun Tue Wed Thu Sat  . Warfarin - Pharmacist Dosing Inpatient   Does not apply q1800    Infusions: . sodium chloride    . DOBUTamine 7.5 mcg/kg/min (03/05/18 2141)  . furosemide (LASIX) infusion 20 mg/hr (03/05/18 2316)    PRN Medications: acetaminophen, cyclobenzaprine, docusate sodium, ondansetron, senna-docusate, traZODone   Patient Profile   Ronald Milleris a 62 y.o.male with history of chronic systolic HF s/p [employer] ICD 2014, CAD s/p CABG in 2000, HTN, Hx of cocaine abuse, Tobacco abuse, Depression, PAF, and COPD. HM3 placed 09/09/2016. In April 2018 he was placed on milrinone but later switched to dobutamine. He remains on dobutamine 7.93mcg  Admitted 02/23/18 with marked volume overload.    Assessment/Plan:     1.Acute/Chronic end-stage biventricular systolic OZ:DGUY 40% due to ICM ->s/p Echo 08/24/16 LVEF 15%, RV mild dilated, moderately reduced. -->S/P HM3 LVAD 09/09/2016. ICD at EOL.Has been turned down for transplant at [facility].  He has significant RV failure and has been on home dobutamine.   - Volume elevated on exam. - Continue dobutamine 7.5 mcg. Co-ox 59.5% - Continue Lasix 20 mg/hr with acetazolamide until he is able to go to SNF. At that point, resume torsemide.  - Continue midodrine 10 TID for low MAPs and cirrhotic physiology.MAPs 70s - Off sildenafil due to hypotension  - No beta blocker with RV failure.  - Warfarin with goal INR 1.8-2.3.INR 2.01 - Continue digoxin. Dig level 0.8 5/29 - Not transplant candidate due to social issues, noncompliance and cirrhosis (has been turned down by [facility]) 2. CAD: Severe 3v- CAD s/p CABG with occluded grafts as above except for LIMA.  - No s/s ischemia.  3. DMII:  - Continue SSI 4. Tobacco abuse:  - Reports complete cessation. No change.  5. Atrial fibrillation, paroxysmal:  Continues to A-V pace   - Continue amiodarone 200 mg daily.  6. Anxiety/Depression - Continue Celexa 20mg  daily . - Palliative Care consult. Open to long term placement at [facility]. No change.  7. Cirrhosis with hepatic encephalopathy: Likely combination ETOH and chronic RV failure. S/P Paracentesis 5/23 with 5.6 liters removed.  - Continue lactulose at 45 tid. No change.  8. Anemia, iron-deficiency: Has had GI work up with EGD  and Capsule Endoscopy showing severe gastropathy. No evidence of active bleeding. Continue carafate. - Hbg 7.6 -> 1 unit PRBC -> 8.1 9. GOC: Palliative Care consulted. He is DNR/DNI. Plans for long term care at [facility]. No change.  10. Wound on chest: WOC consulted. Wound culture from chest shows moderate staph aureus.  - Continue doxy through 6/4.No change.   Plan for d/c to SNF. Need to complete peer-to-peer with insurance today.   Length of Stay: [address] NP 03/06/2018, 7:05 AM  VAD Team --- VAD ISSUES ONLY--- Pager [phone] (7am - 7am)  Advanced Heart Failure Team    Pager [phone] (M-F; 7a - 4p)  Please contact [facility] for night-coverage after hours (4p -7a ) and weekends on [email or web address]  Patient seen and examined with the above-signed Advanced Practice Provider and/or Housestaff. I personally reviewed laboratory data, imaging studies and relevant notes. I independently examined the patient and formulated the important aspects of the plan. I have edited the note to reflect any of my changes or salient points. I have personally discussed the plan with the patient and/or family.  Patient unable to be d/c'd to SNF on Friday due to administrative issues. Over the weekend, lasix held and volume status elevated. Got 1u RBCs. Will continue IV lasix for now and give one-dose of metolazone so we can him as dry as possible prior to d/c. VAD interrogated personally. Parameters stable. INR 2.01. Creatinine stable.   I have d/w SW team this am  and will perform peer-to-peer when information provided.   Ronald Bickers, MD  8:45 AM

## 2018-03-06 NOTE — Progress Notes (Signed)
Discharge note. Patient educated on medications and plan of care, informed that [facility] was called and given report. Spoke with patient's sister on the phone.   Patient discharged with [facility] to [facility].

## 2018-03-06 NOTE — Progress Notes (Signed)
Clinical Social Worker facilitated patient discharge including contacting patient family and facility to confirm patient discharge plans.  Clinical information faxed to facility and family agreeable with plan.  CSW arranged ambulance transport via [facility] to [place] .  RN to call [phone] (pt will go in room [phone]) for report prior to discharge.  Clinical Social Worker will sign off for now as social work intervention is no longer needed. Please consult [place] again if new need arises.  Rhea Pink, MSW, [place]

## 2018-03-13 ENCOUNTER — Ambulatory Visit (HOSPITAL_COMMUNITY): Payer: Self-pay | Admitting: Pharmacist

## 2018-03-13 ENCOUNTER — Other Ambulatory Visit (HOSPITAL_COMMUNITY): Payer: Self-pay | Admitting: Internal Medicine

## 2018-03-13 ENCOUNTER — Other Ambulatory Visit (HOSPITAL_COMMUNITY): Payer: Self-pay | Admitting: *Deleted

## 2018-03-13 ENCOUNTER — Encounter (HOSPITAL_COMMUNITY): Payer: Self-pay

## 2018-03-13 ENCOUNTER — Other Ambulatory Visit: Payer: Self-pay | Admitting: *Deleted

## 2018-03-13 ENCOUNTER — Ambulatory Visit (HOSPITAL_COMMUNITY)
Admit: 2018-03-13 | Discharge: 2018-03-13 | Disposition: A | Discharge status: Home / Self Care | Payer: Medicare HMO | Attending: Cardiology | Admitting: Cardiology

## 2018-03-13 ENCOUNTER — Ambulatory Visit (HOSPITAL_COMMUNITY)
Admission: RE | Admit: 2018-03-13 | Discharge: 2018-03-13 | Disposition: A | Discharge status: Home / Self Care | Payer: Medicare HMO | Source: Ambulatory Visit | Attending: Internal Medicine | Admitting: Internal Medicine

## 2018-03-13 VITALS — BP 88/0 | HR 78 | Wt 217.0 lb

## 2018-03-13 DIAGNOSIS — R188 Other ascites: Secondary | ICD-10-CM | POA: Insufficient documentation

## 2018-03-13 DIAGNOSIS — I50812 Chronic right heart failure: Secondary | ICD-10-CM

## 2018-03-13 DIAGNOSIS — Z7901 Long term (current) use of anticoagulants: Secondary | ICD-10-CM

## 2018-03-13 DIAGNOSIS — Z95811 Presence of heart assist device: Secondary | ICD-10-CM

## 2018-03-13 DIAGNOSIS — K7460 Unspecified cirrhosis of liver: Secondary | ICD-10-CM | POA: Diagnosis not present

## 2018-03-13 DIAGNOSIS — D649 Anemia, unspecified: Secondary | ICD-10-CM

## 2018-03-13 DIAGNOSIS — I5023 Acute on chronic systolic (congestive) heart failure: Secondary | ICD-10-CM

## 2018-03-13 DIAGNOSIS — I50810 Right heart failure, unspecified: Secondary | ICD-10-CM

## 2018-03-13 LAB — PROTIME-INR
INR: 2.6
Prothrombin Time: 27.6 seconds — ABNORMAL HIGH (ref 11.4–15.2)

## 2018-03-13 LAB — COMPREHENSIVE METABOLIC PANEL
ALK PHOS: 100 U/L (ref 38–126)
ALT: 25 U/L (ref 17–63)
AST: 37 U/L (ref 15–41)
Albumin: 3.3 g/dL — ABNORMAL LOW (ref 3.5–5.0)
Anion gap: 9 (ref 5–15)
BILIRUBIN TOTAL: 1 mg/dL (ref 0.3–1.2)
BUN: 41 mg/dL — AB (ref 6–20)
CALCIUM: 8.9 mg/dL (ref 8.9–10.3)
CO2: 33 mmol/L — ABNORMAL HIGH (ref 22–32)
CREATININE: 1.52 mg/dL — AB (ref 0.61–1.24)
Chloride: 85 mmol/L — ABNORMAL LOW (ref 101–111)
GFR calc Af Amer: 55 mL/min — ABNORMAL LOW (ref >60)
GFR calc non Af Amer: 48 mL/min — ABNORMAL LOW (ref >60)
GLUCOSE: 224 mg/dL — AB (ref 65–99)
Potassium: 3.6 mmol/L (ref 3.5–5.1)
Sodium: 127 mmol/L — ABNORMAL LOW (ref 135–145)
TOTAL PROTEIN: 7.2 g/dL (ref 6.5–8.1)

## 2018-03-13 LAB — CBC
HEMATOCRIT: 25.9 % — AB (ref 39.0–52.0)
HEMOGLOBIN: 7.6 g/dL — AB (ref 13.0–17.0)
MCH: 22 pg — ABNORMAL LOW (ref 26.0–34.0)
MCHC: 29.3 g/dL — ABNORMAL LOW (ref 30.0–36.0)
MCV: 74.9 fL — ABNORMAL LOW (ref 78.0–100.0)
Platelets: 219 10*3/uL (ref 150–400)
RBC: 3.46 MIL/uL — ABNORMAL LOW (ref 4.22–5.81)
RDW: 17.8 % — ABNORMAL HIGH (ref 11.5–15.5)
WBC: 6.8 10*3/uL (ref 4.0–10.5)

## 2018-03-13 LAB — LACTATE DEHYDROGENASE: LDH: 171 U/L (ref 98–192)

## 2018-03-13 LAB — AMMONIA: AMMONIA: 40 umol/L — AB (ref 9–35)

## 2018-03-13 MED ORDER — LIDOCAINE HCL (PF) 2 % IJ SOLN
INTRAMUSCULAR | Status: DC | PRN
  Administered 2018-03-13: 10 mL

## 2018-03-13 MED ORDER — FUROSEMIDE 10 MG/ML IJ SOLN
80.0000 mg | Freq: Once | INTRAMUSCULAR | Status: DC
Start: 2018-03-14 — End: 2018-03-13

## 2018-03-13 MED ORDER — SODIUM CHLORIDE 0.9 % IV SOLN: Freq: Once | INTRAVENOUS | Status: DC

## 2018-03-13 MED ORDER — LIDOCAINE HCL (PF) 2 % IJ SOLN
INTRAMUSCULAR | Status: AC
  Filled 2018-03-13: qty 10

## 2018-03-13 MED ORDER — WARFARIN SODIUM 5 MG PO TABS: ORAL_TABLET | ORAL | 3 refills | Status: AC

## 2018-03-13 NOTE — Progress Notes (Signed)
CSW met with patient and sister in the clinic. Patient reports he has been at [facility] for a week now and managing well. Patient's sister asked questions and discussed continued medicaid paperwork. CSW advised and provided supportive intervention. Sister appears to have good understanding and completion of medicaid process. CSW continues to be available as needed. Raquel Sarna, [place], [place]

## 2018-03-13 NOTE — Progress Notes (Signed)
Advanced Heart Failure Clinic Note    Referring Provider: Darylene Price, FNP Primary Care: [facility] Primary Cardiologist: Dr Haroldine Laws.  Transplant Center- [facility]  HPI: Ronald Miller. is a 62 y.o. male with history of chronic systolic HF s/p Med[employer] ICD 2014, CAD s/p CABG in 2000, HTN, Hx of cocaine abuse, Tobacco abuse, Depression, PAF, and COPD. HM3 placed 09/09/2016. In April 2018 he was placed on milrinone but later switched to dobutamine. He remains on dobutamine 7.41mcg.   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  06/01/2017- Capsule Endoscopy- normal except  tiny angiectasia, nonbleeding in the right colon.  Admitted 2/6-2/18/2019 with recurrent volume overload, RHF and ascites despite dobutamine support. Underwent paracentesis on 2/7 and 2/12. He had over 11 liters removed from paracentesis. Diuresed with IV lasix and transitioned back to home torsemide 100 mg/50mg  daily. Overall he lost 47 pounds.  Also treated for hepatic encephalopathy. MAPs were soft with diuresis so midodrine increased to 10 tid. Weight on d/c 195 pounds.   Admitted from clinic 01/10/18-01/27/18 with encephalopathy and acute on chronic CHF. Pt had somehow disconnected his dobutamine.  The line was "fractured" in 2 places. Weight up 30+ pounds from baseline. Dobutamine restarted and patient diuresed with lasix gtt and metolazone. Diamox added. Pt required Paracentesis 4/10 AND 01/16/18 with 4.8L and 2.4 L off, respectively.   Overall, Pt down 31 lb from admit in setting of diuresis and paracentesis. Transitioned back to torsemide 100 mg BID with metolazone as needed. Also had recurrent hepatic encephalopathy on admit.  Ammonia elevated at 72 on admit lactulose increase to 45 tid and improved.  Hospital course complicated by unwitnessed fall 01/2015. CT of head negative as well as self-dislodgment of tunneled PICC line that was replaced  by IR. Pt also noted to have hyponatremia down to 120. Started on Demeclocycline 150 mg BID and free water restricted. This was later stopped as sodium improved. Discharged to [facility] as it was felt he was unable to care for himself. Discharge weight 189 pounds.  Admitted from clinic 02/23/18 - 03/06/18 with volume overload and 40 lb weight gain. Had been discharged from SNF as above on 5/18. He had been drinking lots of fluid. Initially had poor response to IV diuretics, required lasix gtt + diamox + metolazone for diuresis. Transitioned to torsemide 100 mg BID + metolazone 2x/week for home. Dobutamine continued at 7.5 mcg/kg/min. He diuresed 30 lbs. ICD adjusted by Dr. Lovena Le due to pacer spiker in his QRS. Palliative care consulted and patient made DNR/DNI and patient arranged for long term placement at [facility].  Hospital course additionally complicated with open wound on chest. WOCN consulted and culture sent. Placed on doxy/ finished 03/07/18.   He presents today for post hospital follow up. He remains at [facility]. He remains on high-dose dobutamine for RV failure and midodrine for BP support. His weight is up 19 lbs since discharge one week ago. Sister says his diet is not as restricted at SNF as it was in hospital. He remains orthopneic, and gets SOB with his ADLs such as bathing and getting dressed. Denies bleeding. Drinking lots of fluid. No CP or palpitations. No fevers or chills. No syncope. Meds and diet provided by SNF. No BRBPR.  [facility] follow for doubtamine in SNF.   VAD Indication: Destination Therapy - Eval complete at [facility]   VAD interrogation & Equipment Management: Speed: 5800 Flow: 5.4 Power: 4.6  PI:  3.5  Alarms: Several Low voltage, No external power x 2 Events: 0-15 daily  Fixed speed: 5800 Low speed limit: 5500  Primary Controller: Replace back up battery in 19 months. Back up controller:  Not present  Annual Equipment Maintenance on UBC/PM was performed on  09/2017.  Past Medical History:  Diagnosis Date  . AICD (automatic cardioverter/defibrillator) present   . Anxiety   . ASCVD (arteriosclerotic cardiovascular disease)   . Chronic systolic CHF (congestive heart failure) ([place])   . COPD (chronic obstructive pulmonary disease) ([place])   . Coronary artery disease   . Depression   . GI bleed   . High cholesterol   . History of blood transfusion   . History of cocaine abuse   . Hypertension   . LVAD (left ventricular assist device) present ([place])   . Presence of permanent cardiac pacemaker   . Shortness of breath dyspnea   . Suicidal ideation   . Tobacco abuse   . Type II diabetes mellitus ([place])     Current Outpatient Medications  Medication Sig Dispense Refill  . digoxin (DIGOX) 0.125 MG tablet Take 1 tablet (0.125 mg total) daily by mouth. 30 tablet 6  . DOBUTamine (DOBUTREX) 4-5 MG/ML-% infusion Inject 714.75 mcg/min into the vein continuous. PER [facility] pharmacy 250 mL 11  . metolazone (ZAROXOLYN) 2.5 MG tablet Take 2.5 mg by mouth See admin instructions. Take one tablet (2.5mg ) by mouth on Fridays and Mondays.    Marland Kitchen warfarin (COUMADIN) 5 MG tablet Take 1-1.5 tablets (5-7.5 mg total) by mouth See admin instructions. Take 1 and 1/2 tablets (7.5 mg) daily except 1 tablet (5 mg) on Monday and Friday    . acetaminophen (TYLENOL) 500 MG tablet Take 1,000 mg by mouth every 6 (six) hours as needed for moderate pain or headache.    Marland Kitchen amiodarone (PACERONE) 200 MG tablet TAKE ONE TABLET BY MOUTH ONCE DAILY 60 tablet 6  . citalopram (CELEXA) 20 MG tablet Take 1 tablet (20 mg total) by mouth daily. 30 tablet 6  . cyclobenzaprine (FLEXERIL) 10 MG tablet Take 1 tablet (10 mg total) by mouth 3 (three) times daily as needed for muscle spasms. (Patient not taking: Reported on 03/13/2018) 30 tablet 1  . docusate sodium (COLACE) 100 MG capsule Take 200 mg by mouth at bedtime as needed for mild constipation.     . gabapentin (NEURONTIN) 300 MG capsule Take 1  capsule (300 mg total) by mouth 2 (two) times daily. 180 capsule 6  . insulin aspart (NOVOLOG FLEXPEN) 100 UNIT/ML FlexPen Inject 5 Units into the skin 3 (three) times daily with meals. 15 mL 11  . Insulin Glargine (LANTUS SOLOSTAR) 100 UNIT/ML Solostar Pen Inject 10 Units into the skin daily at 10 pm. 1 pen 6  . lactulose (CHRONULAC) 10 GM/15ML solution Take 67.5 mLs (45 g total) by mouth 3 (three) times daily. 240 mL 6  . levothyroxine (SYNTHROID, LEVOTHROID) 50 MCG tablet Take 1 tablet (50 mcg total) by mouth daily before breakfast. 30 tablet 6  . magnesium oxide (MAG-OX) 400 (241.3 Mg) MG tablet Take 1 tablet (400 mg total) by mouth daily. 30 tablet 6  . midodrine (PROAMATINE) 10 MG tablet Take 1 tablet (10 mg total) by mouth 3 (three) times daily with meals. 90 tablet 6  . Multiple Vitamin (MULTIVITAMIN WITH MINERALS) TABS tablet Take 1 tablet by mouth daily.    . ondansetron (ZOFRAN ODT) 4 MG disintegrating tablet Take 1 tablet (4 mg total) by  mouth every 8 (eight) hours as needed for nausea. 6 tablet 0  . pantoprazole (PROTONIX) 40 MG tablet TAKE 1 TABLET BY MOUTH TWICE DAILY WITH A MEAL 60 tablet 6  . potassium chloride SA (K-DUR,KLOR-CON) 20 MEQ tablet Take 2 tablets (40 mEq total) by mouth 2 (two) times daily. Take 40 meq in am and 20 meq in pm 120 tablet 3  . senna-docusate (SENOKOT S) 8.6-50 MG tablet Take 2 tablets by mouth at bedtime as needed for mild constipation. 30 tablet 6  . spironolactone (ALDACTONE) 25 MG tablet Take 1 tablet (25 mg total) by mouth daily. 30 tablet 6  . sucralfate (CARAFATE) 1 GM/10ML suspension Take 10 mLs (1 g total) by mouth 4 (four) times daily -  with meals and at bedtime. 420 mL 6  . torsemide (DEMADEX) 100 MG tablet Take 1 tablet (100 mg total) by mouth 2 (two) times daily. Take 100mg  in the morning and 50mg  in the evening 60 tablet 6  . traZODone (DESYREL) 50 MG tablet Take 1 tablet (50 mg total) by mouth at bedtime as needed for sleep. 30 tablet 6   No  current facility-administered medications for this encounter.     Chlorhexidine gluconate; Codeine; Lipitor [atorvastatin]; and Tape  Review of systems complete and found to be negative unless listed in HPI.     Vitals:   03/13/18 1101 03/13/18 1102  BP: 103/66 (!) 88/0  Pulse: 78   SpO2: 97%    Wt Readings from Last 3 Encounters:  03/13/18 217 lb (98.4 kg)  03/06/18 196 lb 3.4 oz (89 kg)  02/23/18 236 lb (107 kg)    Vital Signs:  Doppler Pressure: 88 Automatc BP:  103/66 (80) HR: 78  Weight: 217 lbs  w/o eqt Last weight: 196 lbs   Physical Exam: GENERAL: Chronically ill appearing. Sister present. NAD.  HEENT: Normal.anicteric  NECK: Supple, JVP to jaw. Carotids OK.  CARDIAC:  Mechanical heart sounds with LVAD hum present. R upper chest tunneled PICC LUNGS:  CTAB, normal effort. No wheeze ABDOMEN:  NT, ++ distended, no HSM. No bruits or masses. +BS  LVAD exit site: Well-healed and incorporated. Dressing dry and intact. No erythema or drainage. Stabilization device present and accurately applied. Driveline dressing changed daily per sterile technique. EXTREMITIES:  Warm and dry. No cyanosis or clubbing. Chronic venous stasis changed. 2-3+ BLE edema.  NEUROLOGIC: alert & oriented x 3, cranial nerves grossly intact. moves all 4 extremities w/o difficulty. Affect flat. No asterixis    ASSESSMENT AND PLAN: 1. Chronic end-stage biventricular systolic HF: LVEF 48% due to ICM -> s/p Echo 08/24/16 LVEF 15%, RV mild dilated, moderately reduced. 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  Has been turned down for [facility] at [facility].  - Complicated by RV failure. Failed milrinone due to persistent RV failure/shock..   - Continue dobutamine 7.5 mcg and midodrine.  - He is s/p recent hospitalization due to recurrent HF.  - Volume status again markedly elevated on exam, though not as significant as previous.  - Weight up 19 lbs from hospital d/c one week ago.  - Continue torsemide 100 mg BID -  Increase metolazone to DAILY.  - Will send for paracentesis TODAY.  - Continue midodrine 10 TID for low MAPs and cirrhotic physiology.   - BMET today.  - Off sildenafil due to hypotension - No beta blocker with RV failure.  - VAD interrogated personally. Parameters stable.  - Warfarin with goal INR 2.0 - 2.5 + ASA 81  daily.  - INR 2.60 today. Discussed dosing with Pharm-D personally.  - LDH 118 - Driveline OK.  - Not transplant candidate due to social issues, noncompliance and cirrhosis (has been turned down by [facility]) 2. CAD: Severe 3v- CAD s/p CABG with occluded grafts as above except for LIMA.  - No s/s of ischemia.    - Continue current therapy.  3. DMII:  - Per PCP 4. Tobacco abuse:  - Complete cessation.  5. Atrial fibrillation, paroxysmal - Remains in NSR.  - Continue amio 200 daily. Careful with cirrhosis  6. Left pleural effusion - Underwent repeat thoracentesis in Jan. 2018.  - No recurrence on exam today.  7.  Anxiety/Depression - Continue Celexa 20mg  daily . - Social issues still a concern, though now long-term SNF.  8. Cirrhosis with hepatic encephalopathy - likely combination ETOH and chronic RV failure - No asterixis.  - Ammonia 30. Continue lactulose at 45 tid - Will send for repeat paracentesis today.  10. Anemia, iron-deficiency:   - Has had GI work up with EGD and Capsule Endoscopy showing severe gastropath  - Hgb 7.6 today. No outwards signs of bleeding.  - Will plan Short stay this week with 2 uPRBCs + IV lasix 80 mg.  - Continue carafate.   Volume status up 19 lbs. Will send orders for fluid and sodium restriction, and increase diuretics as above. Will also send for paracentesis.   [name] Friar, PA-C  10:47 AM  Patient seen and examined with the above-signed Advanced Practice Provider and/or Housestaff. I personally reviewed laboratory data, imaging studies and relevant notes. I independently examined the patient and formulated the important  aspects of the plan. I have edited the note to reflect any of my changes or salient points. I have personally discussed the plan with the patient and/or family.  He remains very tenuous. Requiring high-dose dobutamine for RV support and midodrine for BP support. He is up almost 20 pounds since hospital discharge 1 week ago due to ongoing dietary noncompliance. No evidence of infection or hepatic encephalopathy on exam. Will send for paracentesis today and increase diuretics as above. Hgb low as above and will arrange for 2u RBCs and IV lasix in short stay. INR 2.6. Discussed dosing with PharmD personally. VAD interrogated personally. Parameters stable.   Glori Bickers, MD  11:20 PM

## 2018-03-13 NOTE — Progress Notes (Signed)
IR requested by Dr. Haroldine Laws for possible image-guided paracentesis.  Ultrasound limited abdomen revealed little to no fluid that could be safely accessed with procedure. Ultrasound images reviewed with Dr. Kathlene Cote who agrees with plan.  Procedure will not occur today. IR available if needed in future.  Bea Graff Kamil Hanigan, PA-C 03/13/2018, 12:35 PM

## 2018-03-13 NOTE — Progress Notes (Signed)
Pt presents to clinic for his 1 week hospital follow up per transportation from [facility]. Sister Coralyn Mark) met patient at clinic visit.  Pt in wheelchair, needs assistance to stand for weight and transfer to bed.   Pt's wt up 19 lbs and abdomen is distended. Paracentesis added for today in Interventional Radiology.   Pt admits to drinking more at [facility] and sister confirms they are "not as strict" as the hospital with fluid restrictions.   Pt's med list from [facility] that is inaccurate. Spoke with his nurse and asked that she fax complete med list to our office.   Vital Signs:  Doppler Pressure: 88  Automatc BP: 103/64 (80) HR: 78 SPO2:unable to obtain %  Weight: 217 lbs with eqt Last weight: 196 lbs - hospital d/c wt  VAD Indication: Destination Therapy - eval complete at [facility]  VAD interrogation & Equipment Management: Speed:5800 Flow: 5.4 Power:4.6w    PI: 3.0 Hct: 28  Alarms: one low voltage; 2 "no external power" Events: 0 - 15 PI  daily  Fixed speed 5800 Low speed limit: 5500  Primary Controller:  Replace back up battery in 19 months. Back up controller:   Back up bag was left at [facility].  Annual Equipment Maintenance on UBC/PM was performed on 09/2017.   I reviewed the LVAD parameters from today and compared the results to the patient's prior recorded data. LVAD interrogation was NEGATIVE for significant power changes, NEGATIVE for clinical alarms and STABLE for PI events/speed drops. No programming changes were made and pump is functioning within specified parameters. Pt is performing daily controller and system monitor self tests along with completing weekly and monthly maintenance for LVAD equipment.  LVAD equipment check completed and is in good working order. Back-up equipment not present today. Reminded patient he should keep black bag with him at all times.    Exit Site Care: Drive line dressing has been changed at [employer]. Dressing  dry and intact with anchor intact and accurately applied. Dressing removed and site cleaned with sterile saline only, velour fully implanted at exit site. Site with no redness, tenderness, drainage, or rash noted. Pt will need weekly dressing changes.    Significant Events on VAD Support:  11/30/16> Milrinone gtt at discharge 01/27/17>dobutamine at 5 mcg/kg/mingtt at discharge 03/08/17> RV failure 04/29/17> symptomatic anemia and melena. EGD revealed stomach ulcers. Carafate added; ASA stopped 05/31/17>Ongoing symptomatic anemia and melena; capsule study mild antral gastritis; poss tiny AVM right colon. Lower MAPS, Losartan cut back. 06/2017> admit with syncope, AMS, Dobutamine at 7.81mg 11/09/17>Marked ascites, RV failure, and hepatic encephalopathy. Paracentesis on 2/7 and 2/12 with >11 liters fluid removed   Device:Protect Therapies: off; end of service 08/2016 Last check: complete interrogation 05/26/2017. DDD (lower rate 60); BiV pacing with 96% AS-VP  BP & Labs:  Doppler 88 -  is reflecting MAP  Hgb 7.6 - No S/S of bleeding. Specifically denies melena/BRBPR or nosebleeds.  LDH stable at 171 with established baseline of 150- 250. Denies tea-colored urine. No power elevations noted on interrogation.   Written instructions (below) sent to [facility] along with verbal report/instructions [facility]    Patient Instructions: 1. Paracentesis per IR today added on. 2. Continue Torsemide 100 mg bid. 3. Increase Metolazone to 2.5 mg daily. 4. Increase K-Dur to 60 meq bid 5. Follow low salt diet with 1800 cc fluid restriction. 6. [facility] at 11:00 am.  MZada Girt  RN VAD Coordinator   Office: [phone] 24/7 Emergency VAD Pager: [phone]

## 2018-03-13 NOTE — Patient Instructions (Addendum)
1. Paracentesis per IR today added on. 2. Continue Torsemide 100 mg bid. 3. Increase Metolazone to 2.5 mg daily. 4. Increase K-Dur to 60 meq bid 5. Follow low salt diet with 1800 cc fluid restriction. 6. Return to [place] clinic Monday, June 17th at 11:00 am.

## 2018-03-14 ENCOUNTER — Ambulatory Visit (HOSPITAL_COMMUNITY)
Admission: RE | Admit: 2018-03-14 | Discharge: 2018-03-14 | Disposition: A | Discharge status: Home / Self Care | Payer: Medicare HMO | Source: Ambulatory Visit | Attending: Internal Medicine | Admitting: Internal Medicine

## 2018-03-14 DIAGNOSIS — D649 Anemia, unspecified: Secondary | ICD-10-CM | POA: Diagnosis not present

## 2018-03-14 LAB — PREPARE RBC (CROSSMATCH)

## 2018-03-14 MED ORDER — FUROSEMIDE 10 MG/ML IJ SOLN
INTRAMUSCULAR | Status: AC
  Filled 2018-03-14: qty 4

## 2018-03-14 MED ORDER — FUROSEMIDE 10 MG/ML IJ SOLN
INTRAMUSCULAR | Status: AC
  Administered 2018-03-14: 80 mg via INTRAVENOUS
  Filled 2018-03-14: qty 2

## 2018-03-14 MED ORDER — FUROSEMIDE 10 MG/ML IJ SOLN
80.0000 mg | Freq: Once | INTRAMUSCULAR | Status: AC
  Administered 2018-03-14: 80 mg via INTRAVENOUS

## 2018-03-14 MED ORDER — SODIUM CHLORIDE 0.9 % IV SOLN: Freq: Once | INTRAVENOUS | Status: DC

## 2018-03-15 LAB — BPAM RBC
BLOOD PRODUCT EXPIRATION DATE: 201907072359
Blood Product Expiration Date: 201907072359
ISSUE DATE / TIME: 201906111240
ISSUE DATE / TIME: 201906111008
UNIT TYPE AND RH: 5100
Unit Type and Rh: 5100

## 2018-03-15 LAB — TYPE AND SCREEN
ABO/RH(D): O POS
ANTIBODY SCREEN: NEGATIVE
UNIT DIVISION: 0
Unit division: 0

## 2018-03-20 ENCOUNTER — Other Ambulatory Visit (HOSPITAL_COMMUNITY): Payer: Self-pay | Admitting: Unknown Physician Specialty

## 2018-03-20 ENCOUNTER — Encounter (HOSPITAL_COMMUNITY): Payer: Self-pay

## 2018-03-20 DIAGNOSIS — Z7901 Long term (current) use of anticoagulants: Secondary | ICD-10-CM

## 2018-03-20 DIAGNOSIS — I5023 Acute on chronic systolic (congestive) heart failure: Secondary | ICD-10-CM

## 2018-03-20 DIAGNOSIS — Z95811 Presence of heart assist device: Secondary | ICD-10-CM

## 2018-03-21 ENCOUNTER — Ambulatory Visit (HOSPITAL_COMMUNITY): Payer: Self-pay | Admitting: Pharmacist

## 2018-03-21 ENCOUNTER — Ambulatory Visit (HOSPITAL_COMMUNITY)
Admission: RE | Admit: 2018-03-21 | Discharge: 2018-03-21 | Disposition: A | Discharge status: Home / Self Care | Payer: Medicare HMO | Source: Ambulatory Visit | Attending: Cardiology | Admitting: Cardiology

## 2018-03-21 VITALS — BP 72/0 | HR 80 | Ht 69.0 in | Wt 213.6 lb

## 2018-03-21 DIAGNOSIS — I255 Ischemic cardiomyopathy: Secondary | ICD-10-CM | POA: Diagnosis not present

## 2018-03-21 DIAGNOSIS — Z66 Do not resuscitate: Secondary | ICD-10-CM | POA: Diagnosis not present

## 2018-03-21 DIAGNOSIS — E7800 Pure hypercholesterolemia, unspecified: Secondary | ICD-10-CM | POA: Insufficient documentation

## 2018-03-21 DIAGNOSIS — Z79890 Hormone replacement therapy (postmenopausal): Secondary | ICD-10-CM | POA: Insufficient documentation

## 2018-03-21 DIAGNOSIS — I5022 Chronic systolic (congestive) heart failure: Secondary | ICD-10-CM | POA: Insufficient documentation

## 2018-03-21 DIAGNOSIS — F329 Major depressive disorder, single episode, unspecified: Secondary | ICD-10-CM | POA: Insufficient documentation

## 2018-03-21 DIAGNOSIS — Z79899 Other long term (current) drug therapy: Secondary | ICD-10-CM | POA: Diagnosis not present

## 2018-03-21 DIAGNOSIS — E119 Type 2 diabetes mellitus without complications: Secondary | ICD-10-CM | POA: Insufficient documentation

## 2018-03-21 DIAGNOSIS — F1410 Cocaine abuse, uncomplicated: Secondary | ICD-10-CM | POA: Diagnosis not present

## 2018-03-21 DIAGNOSIS — F17200 Nicotine dependence, unspecified, uncomplicated: Secondary | ICD-10-CM | POA: Diagnosis not present

## 2018-03-21 DIAGNOSIS — I5082 Biventricular heart failure: Secondary | ICD-10-CM | POA: Diagnosis present

## 2018-03-21 DIAGNOSIS — F419 Anxiety disorder, unspecified: Secondary | ICD-10-CM | POA: Diagnosis not present

## 2018-03-21 DIAGNOSIS — K7460 Unspecified cirrhosis of liver: Secondary | ICD-10-CM | POA: Diagnosis not present

## 2018-03-21 DIAGNOSIS — Z95810 Presence of automatic (implantable) cardiac defibrillator: Secondary | ICD-10-CM | POA: Diagnosis not present

## 2018-03-21 DIAGNOSIS — D509 Iron deficiency anemia, unspecified: Secondary | ICD-10-CM | POA: Insufficient documentation

## 2018-03-21 DIAGNOSIS — J449 Chronic obstructive pulmonary disease, unspecified: Secondary | ICD-10-CM | POA: Diagnosis not present

## 2018-03-21 DIAGNOSIS — J90 Pleural effusion, not elsewhere classified: Secondary | ICD-10-CM | POA: Diagnosis not present

## 2018-03-21 DIAGNOSIS — I480 Paroxysmal atrial fibrillation: Secondary | ICD-10-CM | POA: Insufficient documentation

## 2018-03-21 DIAGNOSIS — D649 Anemia, unspecified: Secondary | ICD-10-CM | POA: Diagnosis not present

## 2018-03-21 DIAGNOSIS — K7290 Hepatic failure, unspecified without coma: Secondary | ICD-10-CM | POA: Insufficient documentation

## 2018-03-21 DIAGNOSIS — Z794 Long term (current) use of insulin: Secondary | ICD-10-CM | POA: Insufficient documentation

## 2018-03-21 DIAGNOSIS — I2510 Atherosclerotic heart disease of native coronary artery without angina pectoris: Secondary | ICD-10-CM | POA: Diagnosis not present

## 2018-03-21 DIAGNOSIS — Z7901 Long term (current) use of anticoagulants: Secondary | ICD-10-CM | POA: Diagnosis not present

## 2018-03-21 DIAGNOSIS — Z951 Presence of aortocoronary bypass graft: Secondary | ICD-10-CM | POA: Diagnosis not present

## 2018-03-21 DIAGNOSIS — I50810 Right heart failure, unspecified: Secondary | ICD-10-CM

## 2018-03-21 DIAGNOSIS — Z95811 Presence of heart assist device: Secondary | ICD-10-CM | POA: Insufficient documentation

## 2018-03-21 DIAGNOSIS — I110 Hypertensive heart disease with heart failure: Secondary | ICD-10-CM | POA: Insufficient documentation

## 2018-03-21 DIAGNOSIS — Z9119 Patient's noncompliance with other medical treatment and regimen: Secondary | ICD-10-CM | POA: Diagnosis not present

## 2018-03-21 DIAGNOSIS — R188 Other ascites: Secondary | ICD-10-CM

## 2018-03-21 LAB — COMPREHENSIVE METABOLIC PANEL
ALK PHOS: 86 U/L (ref 38–126)
ALT: 18 U/L (ref 17–63)
ANION GAP: 10 (ref 5–15)
AST: 25 U/L (ref 15–41)
Albumin: 3.2 g/dL — ABNORMAL LOW (ref 3.5–5.0)
BUN: 38 mg/dL — ABNORMAL HIGH (ref 6–20)
CALCIUM: 8.9 mg/dL (ref 8.9–10.3)
CHLORIDE: 90 mmol/L — AB (ref 101–111)
CO2: 34 mmol/L — ABNORMAL HIGH (ref 22–32)
Creatinine, Ser: 1.52 mg/dL — ABNORMAL HIGH (ref 0.61–1.24)
GFR calc non Af Amer: 48 mL/min — ABNORMAL LOW (ref >60)
GFR, EST AFRICAN AMERICAN: 55 mL/min — AB (ref >60)
Glucose, Bld: 152 mg/dL — ABNORMAL HIGH (ref 65–99)
POTASSIUM: 3.8 mmol/L (ref 3.5–5.1)
SODIUM: 134 mmol/L — AB (ref 135–145)
Total Bilirubin: 1 mg/dL (ref 0.3–1.2)
Total Protein: 7 g/dL (ref 6.5–8.1)

## 2018-03-21 LAB — CBC
HEMATOCRIT: 28.4 % — AB (ref 39.0–52.0)
HEMOGLOBIN: 8 g/dL — AB (ref 13.0–17.0)
MCH: 22 pg — ABNORMAL LOW (ref 26.0–34.0)
MCHC: 28.2 g/dL — ABNORMAL LOW (ref 30.0–36.0)
MCV: 78 fL (ref 78.0–100.0)
Platelets: 184 10*3/uL (ref 150–400)
RBC: 3.64 MIL/uL — AB (ref 4.22–5.81)
RDW: 18.1 % — ABNORMAL HIGH (ref 11.5–15.5)
WBC: 5.8 10*3/uL (ref 4.0–10.5)

## 2018-03-21 LAB — PROTIME-INR
INR: 2.19
PROTHROMBIN TIME: 24.2 s — AB (ref 11.4–15.2)

## 2018-03-21 LAB — LACTATE DEHYDROGENASE: LDH: 138 U/L (ref 98–192)

## 2018-03-21 LAB — AMMONIA: Ammonia: 51 umol/L — ABNORMAL HIGH (ref 9–35)

## 2018-03-21 LAB — PREALBUMIN: Prealbumin: 18.4 mg/dL (ref 18–38)

## 2018-03-21 NOTE — Progress Notes (Signed)
CSW met with patient in the clinic. Patient residing at [facility] and states he is unable to use a razor and would like to have an Copy.  Patient continued to share about his health and ongoing issues. He states frustration with the limiting of his fluids and asking for "a cold drink". CSW provided supportive listening and will continue to be available for patient as needed. Patient appears to be adjusting to life at the SNF although struggling with management of his diet. CSW continues to follow for supportive needs as identified. Raquel Sarna, [place], [place]

## 2018-03-21 NOTE — Progress Notes (Signed)
Advanced Heart Failure Clinic Note  Pt presents to clinic for his 1 week follow up per transportation from [facility]. Pt in wheelchair, needs assistance to stand for weight and transfer to bed.   Vital Signs:  Doppler Pressure: 72   Automatc BP: 97/76 (84) HR: 80 SPO2:unable to obtain %  Weight: 213.6lbs with eqt Last weight: 217 lbs - hospital d/c wt  VAD Indication: Destination Therapy - eval complete at [facility]  VAD interrogation & Equipment Management: Speed:5800 Flow: 5.4 Power:4.5w PI: 2.6 Hct: 28  Alarms: several no external powers, pt states that he keeps getting unplugged from the wall or double disconnecting. Pt had difficulty managing his equipment today.  Events: 0 - 15 PI  daily  Fixed speed 5800 Low speed limit: 5500  Primary Controller: Replace back up battery in 19 months. Back up controller:  Back up bag was left at [facility].  Annual Equipment Maintenance on UBC/PM was performed on 09/2017.  I reviewed the LVAD parameters from todayand compared the results to the patient's prior recorded data.LVAD interrogation was NEGATIVEfor significant power changes, NEGATIVEfor clinicalalarms and STABLEfor PI events/speed drops. No programming changes were madeand pump is functioning within specified parameters. Pt is performing daily controller and system monitor self tests along with completing weekly and monthly maintenance for LVAD equipment.  LVAD equipment check completed and is in good working order. Back-up equipment not present today. Reminded patient he should keep black bag with him at all times.    Exit Site Care: VAD coordinators are managing driveline dressing.. Dressing dry and intact with anchor intact and accurately applied. Dressing removed and site cleaned with sterile saline and betadine, velour fully implanted at exit site. Site with no redness, tenderness, drainage, or rash noted. Pt will need weekly dressing changes.     Significant Events on VAD Support:  11/30/16> Milrinone gtt at discharge 01/27/17>dobutamine at 5 mcg/kg/mingtt at discharge 03/08/17> RV failure 04/29/17> symptomatic anemia and melena. EGD revealed stomach ulcers. Carafate added; ASA stopped 05/31/17>Ongoing symptomatic anemia and melena; capsule study mild antral gastritis; poss tiny AVM right colon. Lower MAPS, Losartan cut back. 06/2017> admit with syncope, AMS, Dobutamine at 7.52mcg 11/09/17>Marked ascites, RV failure, and hepatic encephalopathy. Paracentesis on 2/7 and 2/12 with >11 liters fluid removed   Device:Protect Therapies: off; end of service 08/2016 Last check: complete interrogation 05/26/2017. DDD (lower rate 60); BiV pacing with 96% AS-VP  BP &Labs:  Doppler 72 -  is reflecting MAP  Hgb8.0- No S/S of bleeding. Specifically denies melena/BRBPR or nosebleeds.  LDH stable at 138 with established baseline of 150- 250. Denies tea-colored urine. No power elevations noted on interrogation.   1.5 year Intermacs follow up completed including:  Quality of Life, KCCQ-12, and Neurocognitive trail making.   Pt is basically wheelchair bound and unable to complete 6 minute walk.  Prealbumin low today at 18.4 and Ammonia is 51.   Written instructions (below) sent to [facility].  Patient Instructions: 1. No change in medications.  6. Return to [place] clinic Tuesday, June 25th at 09:00 am.  Tanda Rockers RN [place] Coordinator   Office: [phone] 24/7 Emergency VAD Pager: [phone]    Referring Provider: Darylene Price, FNP Primary Care: [facility] Primary Cardiologist: Dr Haroldine Laws.  Transplant Center- [facility]  HPI: Ronald Miller. is a 62 y.o. male with history of chronic systolic HF s/p Medtronic ICD 2014, CAD s/p CABG in 2000, HTN, Hx of cocaine abuse, Tobacco abuse, Depression, PAF, and COPD. HM3 placed 09/09/2016. In  April 2018 he was placed on milrinone but later switched to dobutamine. He  remains on dobutamine 7.15mcg.   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  06/01/2017- Capsule Endoscopy- normal except  tiny angiectasia, nonbleeding in the right colon.  Admitted 2/6-2/18/2019 with recurrent volume overload, RHF and ascites despite dobutamine support. Underwent paracentesis on 2/7 and 2/12. He had over 11 liters removed from paracentesis. Diuresed with IV lasix and transitioned back to home torsemide 100 mg/50mg  daily. Overall he lost 47 pounds.  Also treated for hepatic encephalopathy. MAPs were soft with diuresis so midodrine increased to 10 tid. Weight on d/c 195 pounds.   Admitted from clinic 01/10/18-01/27/18 with encephalopathy and acute on chronic CHF. Pt had somehow disconnected his dobutamine.  The line was "fractured" in 2 places. Weight up 30+ pounds from baseline. Dobutamine restarted and patient diuresed with lasix gtt and metolazone. Diamox added. Pt required Paracentesis 4/10 AND 01/16/18 with 4.8L and 2.4 L off, respectively.   Overall, Pt down 31 lb from admit in setting of diuresis and paracentesis. Transitioned back to torsemide 100 mg BID with metolazone as needed. Also had recurrent hepatic encephalopathy on admit.  Ammonia elevated at 72 on admit lactulose increase to 45 tid and improved.  Hospital course complicated by unwitnessed fall 01/2015. CT of head negative as well as self-dislodgment of tunneled PICC line that was replaced by IR. Pt also noted to have hyponatremia down to 120. Started on Demeclocycline 150 mg BID and free water restricted. This was later stopped as sodium improved. Discharged to [facility] as it was felt he was unable to care for himself. Discharge weight 189 pounds.  Admitted from clinic 02/23/18 - 03/06/18 with volume overload and 40 lb weight gain. Had been discharged from SNF as above on 5/18. He had been drinking lots of fluid. Initially had poor response to IV diuretics,  required lasix gtt + diamox + metolazone for diuresis. Transitioned to torsemide 100 mg BID + metolazone 2x/week for home. Dobutamine continued at 7.5 mcg/kg/min. He diuresed 30 lbs. ICD adjusted by Dr. Lovena Le due to pacer spiker in his QRS. Palliative care consulted and patient made DNR/DNI and patient arranged for long term placement at [facility].  Hospital course additionally complicated with open wound on chest. WOCN consulted and culture sent. Placed on doxy/ finished 03/07/18.   Today he returns 1 week follow up. Last week he was evaluated and given 2URPBCs . Also started daily metolazone for 15 pound weight gain.  Overall feeling fine. Remains SOB with exertion. Denies PND/orthopnea. No bleeding problems. Appetite ok. Eating high salt foods such as pizza. No fever or chills. He has not been weighing at SNF. Taking all medications.He remains at [facility].   VAD Indication: Destination Therapy - Eval complete at [facility].   VAD interrogation & Equipment Management: Speed: 5800 Flow: 5.4 Power: 4.6 PI:  3.5  Alarms: 4 no external power on 6/11, 6/12, 6/18  Events: 0-15 daily  Fixed speed: 5800 Low speed limit: 5500  Primary Controller: Replace back up battery in 19 months. Back up controller:  Not present  Annual Equipment Maintenance on UBC/PM was performed on 09/2017.  Past Medical History:  Diagnosis Date  . AICD (automatic cardioverter/defibrillator) present   . Anxiety   . ASCVD (arteriosclerotic cardiovascular disease)   . Chronic systolic CHF (congestive heart failure) ([place])   . COPD (chronic obstructive pulmonary disease) ([place])   . Coronary artery  disease   . Depression   . GI bleed   . High cholesterol   . History of blood transfusion   . History of cocaine abuse   . Hypertension   . LVAD (left ventricular assist device) present ([place])   . Presence of permanent cardiac pacemaker   . Shortness of breath dyspnea   . Suicidal ideation   . Tobacco abuse   .  Type II diabetes mellitus ([place])     Current Outpatient Medications  Medication Sig Dispense Refill  . acetaminophen (TYLENOL) 500 MG tablet Take 1,000 mg by mouth every 6 (six) hours as needed for moderate pain or headache.    Marland Kitchen amiodarone (PACERONE) 200 MG tablet TAKE ONE TABLET BY MOUTH ONCE DAILY 60 tablet 6  . citalopram (CELEXA) 20 MG tablet Take 1 tablet (20 mg total) by mouth daily. 30 tablet 6  . cyclobenzaprine (FLEXERIL) 10 MG tablet Take 1 tablet (10 mg total) by mouth 3 (three) times daily as needed for muscle spasms. (Patient not taking: Reported on 03/13/2018) 30 tablet 1  . digoxin (DIGOX) 0.125 MG tablet Take 1 tablet (0.125 mg total) daily by mouth. 30 tablet 6  . DOBUTamine (DOBUTREX) 4-5 MG/ML-% infusion Inject 714.75 mcg/min into the vein continuous. PER [facility] pharmacy 250 mL 11  . docusate sodium (COLACE) 100 MG capsule Take 200 mg by mouth at bedtime as needed for mild constipation.     . gabapentin (NEURONTIN) 300 MG capsule Take 1 capsule (300 mg total) by mouth 2 (two) times daily. 180 capsule 6  . insulin aspart (NOVOLOG FLEXPEN) 100 UNIT/ML FlexPen Inject 5 Units into the skin 3 (three) times daily with meals. 15 mL 11  . Insulin Glargine (LANTUS SOLOSTAR) 100 UNIT/ML Solostar Pen Inject 10 Units into the skin daily at 10 pm. 1 pen 6  . lactulose (CHRONULAC) 10 GM/15ML solution Take 67.5 mLs (45 g total) by mouth 3 (three) times daily. 240 mL 6  . levothyroxine (SYNTHROID, LEVOTHROID) 50 MCG tablet Take 1 tablet (50 mcg total) by mouth daily before breakfast. 30 tablet 6  . magnesium oxide (MAG-OX) 400 (241.3 Mg) MG tablet Take 1 tablet (400 mg total) by mouth daily. 30 tablet 6  . metolazone (ZAROXOLYN) 2.5 MG tablet Take 2.5 mg by mouth See admin instructions. Take one tablet (2.5mg ) by mouth on Fridays and Mondays.    . midodrine (PROAMATINE) 10 MG tablet Take 1 tablet (10 mg total) by mouth 3 (three) times daily with meals. 90 tablet 6  . Multiple Vitamin (MULTIVITAMIN  WITH MINERALS) TABS tablet Take 1 tablet by mouth daily.    . ondansetron (ZOFRAN ODT) 4 MG disintegrating tablet Take 1 tablet (4 mg total) by mouth every 8 (eight) hours as needed for nausea. 6 tablet 0  . pantoprazole (PROTONIX) 40 MG tablet TAKE 1 TABLET BY MOUTH TWICE DAILY WITH A MEAL 60 tablet 6  . potassium chloride SA (K-DUR,KLOR-CON) 20 MEQ tablet Take 2 tablets (40 mEq total) by mouth 2 (two) times daily. Take 40 meq in am and 20 meq in pm 120 tablet 3  . senna-docusate (SENOKOT S) 8.6-50 MG tablet Take 2 tablets by mouth at bedtime as needed for mild constipation. 30 tablet 6  . spironolactone (ALDACTONE) 25 MG tablet Take 1 tablet (25 mg total) by mouth daily. 30 tablet 6  . sucralfate (CARAFATE) 1 GM/10ML suspension Take 10 mLs (1 g total) by mouth 4 (four) times daily -  with meals and at bedtime. [place]  mL 6  . torsemide (DEMADEX) 100 MG tablet Take 1 tablet (100 mg total) by mouth 2 (two) times daily. Take 100mg  in the morning and 50mg  in the evening 60 tablet 6  . traZODone (DESYREL) 50 MG tablet Take 1 tablet (50 mg total) by mouth at bedtime as needed for sleep. 30 tablet 6  . warfarin (COUMADIN) 5 MG tablet Take 5mg  (1tab) Monday, Wednesday and Friday, take 7.5mg  (1.5tab) Tuesday, Thursday, Saturday and Sunday 60 tablet 3   No current facility-administered medications for this encounter.     Chlorhexidine gluconate; Codeine; Lipitor [atorvastatin]; and Tape  Review of systems complete and found to be negative unless listed in HPI.     Vitals:   03/21/18 0934 03/21/18 0935  BP: 97/76 (!) 72/0  Pulse: 80    Wt Readings from Last 3 Encounters:  03/21/18 213 lb 9.6 oz (96.9 kg)  03/14/18 219 lb (99.3 kg)  03/13/18 217 lb (98.4 kg)    Vital Signs:   Weight: 213.6   w/o eqt Last weight: 217  lbs    Physical Exam: GENERAL: NAD  HEENT: normal  NECK: Supple, JVP 9-10 .  2+ bilaterally, no bruits.  No lymphadenopathy or thyromegaly appreciated.   CARDIAC:  Mechanical  heart sounds with LVAD hum present. R upper chest tunneled PICC site appears ok.  LUNGS:  Clear to auscultation bilaterally.  ABDOMEN:  Soft, round, nontender, positive bowel sounds x4.     LVAD exit site: well-healed and incorporated.  Dressing dry and intact.  No erythema or drainage.  Stabilization device present and accurately applied.  Driveline dressing is being changed daily per sterile technique. EXTREMITIES:  Warm and dry, no cyanosis, clubbing, rash or edema  NEUROLOGIC:  Alert and oriented x 4.  No aphasia.  No dysarthria.  Affect pleasant.     ASSESSMENT AND PLAN: 1. Chronic end-stage biventricular systolic HF: LVEF 38% due to ICM -> s/p Echo 08/24/16 LVEF 15%, RV mild dilated, moderately reduced. 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  Has been turned down for transplant at [facility].  - Complicated by RV failure. Failed milrinone due to persistent RV failure/shock..   - Continue dobutamine 7.5 mcg and midodrine.  -Volume status a little better but remains elevated in the setting of high sodium diet.  Continue torsemide 100 mg BID + metolazone daily - - Continue midodrine 10 TID for low MAPs and cirrhotic physiology.   - Off sildenafil due to hypotension - No beta blocker with RV failure.  - VAD interrogated personally. Parameters stable.  - Warfarin with goal INR 2.0 - 2.5 + ASA 81 daily.  - Check INR -->2.2 - Check LDH today--->152  Driveline OK.  - Not transplant candidate due to social issues, noncompliance and cirrhosis (has been turned down by [facility]) 2. CAD: Severe 3v- CAD s/p CABG with occluded grafts as above except for LIMA.  -No s/s ischemia   - Continue current therapy.  3. DMII:  - Per PCP 4. Tobacco abuse:  - Complete cessation.  5. Atrial fibrillation, paroxysmal - Continue amio 200 daily. Careful with cirrhosis  6. Left pleural effusion - Underwent repeat thoracentesis in Jan. 2018.  - No recurrence on exam today.  7.  Anxiety/Depression - Continue Celexa 20mg  daily . -  Social issues still a concern, though now long-term SNF.  8. Cirrhosis with hepatic encephalopathy - likely combination ETOH and chronic RV failure - No asterixis.  . Continue lactulose at 45 tid -On 6/10 he had abdominal  ultrasound but there was no fluid to remove.  - Ammonia 50. If remains high next visit will need to increase lactulose.  10. Anemia, iron-deficiency:   - Has had GI work up with EGD and Capsule Endoscopy showing severe gastropath  - Last week received 2UPRBCS. Hgb 7.6-->todays hgb 8. No obvious source of bleeding. -  - Continue carafate.   Follow up in 2 weeks.He remains at high risk for readmit due to poor insight and RV failure/liver failure. Needs to stay at SNF.   See LVAD Coordinator above.   Darrick Grinder, NP  9:39 AM

## 2018-03-21 NOTE — Progress Notes (Signed)
Pt presents to clinic for his 1 week follow up per transportation from [facility]. Pt in wheelchair, needs assistance to stand for weight and transfer to bed.   Vital Signs:  Doppler Pressure: 72  Automatc BP: 97/76 (84) HR: 80 SPO2:unable to obtain %  Weight: 213.6lbs with eqt Last weight: 217 lbs - hospital d/c wt  VAD Indication: Destination Therapy - eval complete at [facility]  VAD interrogation & Equipment Management: Speed:5800 Flow: 5.4 Power:4.5w    PI: 2.6 Hct: 28  Alarms: several no external powers, pt states that he keeps getting unplugged from the wall or double disconnecting. Pt had difficulty managing his equipment today.  Events: 0 - 15 PI  daily  Fixed speed 5800 Low speed limit: 5500  Primary Controller:  Replace back up battery in 19 months. Back up controller:   Back up bag was left at [facility].  Annual Equipment Maintenance on UBC/PM was performed on 09/2017.   I reviewed the LVAD parameters from today and compared the results to the patient's prior recorded data. LVAD interrogation was NEGATIVE for significant power changes, NEGATIVE for clinical alarms and STABLE for PI events/speed drops. No programming changes were made and pump is functioning within specified parameters. Pt is performing daily controller and system monitor self tests along with completing weekly and monthly maintenance for LVAD equipment.  LVAD equipment check completed and is in good working order. Back-up equipment not present today. Reminded patient he should keep black bag with him at all times.    Exit Site Care: VAD coordinators are managing driveline dressing.. Dressing dry and intact with anchor intact and accurately applied. Dressing removed and site cleaned with sterile saline and betadine, velour fully implanted at exit site. Site with no redness, tenderness, drainage, or rash noted. Pt will need weekly dressing changes.    Significant Events on VAD Support:  11/30/16>  Milrinone gtt at discharge 01/27/17>dobutamine at 5 mcg/kg/mingtt at discharge 03/08/17> RV failure 04/29/17> symptomatic anemia and melena. EGD revealed stomach ulcers. Carafate added; ASA stopped 05/31/17>Ongoing symptomatic anemia and melena; capsule study mild antral gastritis; poss tiny AVM right colon. Lower MAPS, Losartan cut back. 06/2017> admit with syncope, AMS, Dobutamine at 7.4mcg 11/09/17>Marked ascites, RV failure, and hepatic encephalopathy. Paracentesis on 2/7 and 2/12 with >11 liters fluid removed   Device:Protect Therapies: off; end of service 08/2016 Last check: complete interrogation 05/26/2017. DDD (lower rate 60); BiV pacing with 96% AS-VP  BP & Labs:  Doppler 72 -  is reflecting MAP  Hgb 8.0- No S/S of bleeding. Specifically denies melena/BRBPR or nosebleeds.  LDH stable at 138 with established baseline of 150- 250. Denies tea-colored urine. No power elevations noted on interrogation.   1.5 year Intermacs follow up completed including:  Quality of Life, KCCQ-12, and Neurocognitive trail making.   Pt is basically wheelchair bound and unable to complete 6 minute walk.  Prealbumin low today at 18.4 and Ammonia is 51.   Written instructions (below) sent to [facility].  Patient Instructions: 1. No change in medications.  6. Return to [place] clinic Tuesday, June 25th at 09:00 am.  Tanda Rockers RN [place] Coordinator   Office: [phone] 24/7 Emergency VAD Pager: [phone]

## 2018-03-27 ENCOUNTER — Other Ambulatory Visit (HOSPITAL_COMMUNITY): Payer: Self-pay | Admitting: *Deleted

## 2018-03-27 DIAGNOSIS — Z95811 Presence of heart assist device: Secondary | ICD-10-CM

## 2018-03-27 DIAGNOSIS — I5043 Acute on chronic combined systolic (congestive) and diastolic (congestive) heart failure: Secondary | ICD-10-CM

## 2018-03-27 DIAGNOSIS — Z7901 Long term (current) use of anticoagulants: Secondary | ICD-10-CM

## 2018-03-28 ENCOUNTER — Ambulatory Visit (HOSPITAL_COMMUNITY): Payer: Self-pay | Admitting: Pharmacist

## 2018-03-28 ENCOUNTER — Ambulatory Visit (HOSPITAL_COMMUNITY)
Admission: RE | Admit: 2018-03-28 | Discharge: 2018-03-28 | Disposition: A | Discharge status: Home / Self Care | Payer: Medicare HMO | Source: Ambulatory Visit | Attending: Internal Medicine | Admitting: Internal Medicine

## 2018-03-28 ENCOUNTER — Encounter (HOSPITAL_COMMUNITY): Payer: Self-pay

## 2018-03-28 VITALS — BP 80/0 | HR 65 | Ht 69.0 in | Wt 209.6 lb

## 2018-03-28 DIAGNOSIS — F329 Major depressive disorder, single episode, unspecified: Secondary | ICD-10-CM | POA: Diagnosis not present

## 2018-03-28 DIAGNOSIS — D509 Iron deficiency anemia, unspecified: Secondary | ICD-10-CM | POA: Insufficient documentation

## 2018-03-28 DIAGNOSIS — F5101 Primary insomnia: Secondary | ICD-10-CM | POA: Diagnosis not present

## 2018-03-28 DIAGNOSIS — E119 Type 2 diabetes mellitus without complications: Secondary | ICD-10-CM | POA: Insufficient documentation

## 2018-03-28 DIAGNOSIS — I5022 Chronic systolic (congestive) heart failure: Secondary | ICD-10-CM | POA: Insufficient documentation

## 2018-03-28 DIAGNOSIS — Z66 Do not resuscitate: Secondary | ICD-10-CM | POA: Diagnosis not present

## 2018-03-28 DIAGNOSIS — Z951 Presence of aortocoronary bypass graft: Secondary | ICD-10-CM | POA: Diagnosis not present

## 2018-03-28 DIAGNOSIS — Z95811 Presence of heart assist device: Secondary | ICD-10-CM | POA: Insufficient documentation

## 2018-03-28 DIAGNOSIS — I2510 Atherosclerotic heart disease of native coronary artery without angina pectoris: Secondary | ICD-10-CM | POA: Diagnosis not present

## 2018-03-28 DIAGNOSIS — Z79899 Other long term (current) drug therapy: Secondary | ICD-10-CM | POA: Insufficient documentation

## 2018-03-28 DIAGNOSIS — F419 Anxiety disorder, unspecified: Secondary | ICD-10-CM | POA: Insufficient documentation

## 2018-03-28 DIAGNOSIS — K7290 Hepatic failure, unspecified without coma: Secondary | ICD-10-CM | POA: Diagnosis not present

## 2018-03-28 DIAGNOSIS — I110 Hypertensive heart disease with heart failure: Secondary | ICD-10-CM | POA: Insufficient documentation

## 2018-03-28 DIAGNOSIS — R188 Other ascites: Secondary | ICD-10-CM

## 2018-03-28 DIAGNOSIS — Z794 Long term (current) use of insulin: Secondary | ICD-10-CM | POA: Diagnosis not present

## 2018-03-28 DIAGNOSIS — I5043 Acute on chronic combined systolic (congestive) and diastolic (congestive) heart failure: Secondary | ICD-10-CM | POA: Diagnosis not present

## 2018-03-28 DIAGNOSIS — E876 Hypokalemia: Secondary | ICD-10-CM

## 2018-03-28 DIAGNOSIS — I480 Paroxysmal atrial fibrillation: Secondary | ICD-10-CM | POA: Diagnosis not present

## 2018-03-28 DIAGNOSIS — J449 Chronic obstructive pulmonary disease, unspecified: Secondary | ICD-10-CM | POA: Diagnosis not present

## 2018-03-28 DIAGNOSIS — F1410 Cocaine abuse, uncomplicated: Secondary | ICD-10-CM | POA: Insufficient documentation

## 2018-03-28 DIAGNOSIS — Z79890 Hormone replacement therapy (postmenopausal): Secondary | ICD-10-CM | POA: Insufficient documentation

## 2018-03-28 DIAGNOSIS — Z7901 Long term (current) use of anticoagulants: Secondary | ICD-10-CM | POA: Insufficient documentation

## 2018-03-28 DIAGNOSIS — E7800 Pure hypercholesterolemia, unspecified: Secondary | ICD-10-CM | POA: Insufficient documentation

## 2018-03-28 DIAGNOSIS — I50812 Chronic right heart failure: Secondary | ICD-10-CM | POA: Diagnosis not present

## 2018-03-28 DIAGNOSIS — K7460 Unspecified cirrhosis of liver: Secondary | ICD-10-CM | POA: Diagnosis not present

## 2018-03-28 DIAGNOSIS — D649 Anemia, unspecified: Secondary | ICD-10-CM | POA: Insufficient documentation

## 2018-03-28 LAB — BASIC METABOLIC PANEL
Anion gap: 11 (ref 5–15)
BUN: 45 mg/dL — ABNORMAL HIGH (ref 8–23)
CO2: 32 mmol/L (ref 22–32)
Calcium: 9 mg/dL (ref 8.9–10.3)
Chloride: 87 mmol/L — ABNORMAL LOW (ref 98–111)
Creatinine, Ser: 1.83 mg/dL — ABNORMAL HIGH (ref 0.61–1.24)
GFR calc Af Amer: 44 mL/min — ABNORMAL LOW (ref >60)
GFR, EST NON AFRICAN AMERICAN: 38 mL/min — AB (ref >60)
GLUCOSE: 138 mg/dL — AB (ref 70–99)
POTASSIUM: 3.3 mmol/L — AB (ref 3.5–5.1)
Sodium: 130 mmol/L — ABNORMAL LOW (ref 135–145)

## 2018-03-28 LAB — CBC
HEMATOCRIT: 28.1 % — AB (ref 39.0–52.0)
Hemoglobin: 7.9 g/dL — ABNORMAL LOW (ref 13.0–17.0)
MCH: 21.7 pg — ABNORMAL LOW (ref 26.0–34.0)
MCHC: 28.1 g/dL — ABNORMAL LOW (ref 30.0–36.0)
MCV: 77.2 fL — ABNORMAL LOW (ref 78.0–100.0)
PLATELETS: 184 10*3/uL (ref 150–400)
RBC: 3.64 MIL/uL — ABNORMAL LOW (ref 4.22–5.81)
RDW: 18.7 % — AB (ref 11.5–15.5)
WBC: 6.3 10*3/uL (ref 4.0–10.5)

## 2018-03-28 LAB — PROTIME-INR
INR: 1.41
PROTHROMBIN TIME: 17.1 s — AB (ref 11.4–15.2)

## 2018-03-28 LAB — AMMONIA: Ammonia: 52 umol/L — ABNORMAL HIGH (ref 9–35)

## 2018-03-28 LAB — LACTATE DEHYDROGENASE: LDH: 149 U/L (ref 98–192)

## 2018-03-28 MED ORDER — TORSEMIDE 100 MG PO TABS: 100.0000 mg | ORAL_TABLET | Freq: Two times a day (BID) | ORAL | Status: AC

## 2018-03-28 MED ORDER — POTASSIUM CHLORIDE CRYS ER 20 MEQ PO TBCR: 80.0000 meq | EXTENDED_RELEASE_TABLET | Freq: Two times a day (BID) | ORAL | 3 refills | Status: DC

## 2018-03-28 NOTE — Progress Notes (Signed)
Advanced Heart Failure Clinic Note    Referring Provider: Darylene Price, FNP Primary Care: [facility] Primary Cardiologist: Dr Haroldine Laws.  Transplant Center- [facility]  HPI: Ronald Miller. is a 62 y.o. male with history of chronic systolic HF s/p Medtronic ICD 2014, CAD s/p CABG in 2000, HTN, Hx of cocaine abuse, Tobacco abuse, Depression, PAF, and COPD. HM3 placed 09/09/2016. In April 2018 he was placed on milrinone but later switched to dobutamine. He remains on dobutamine 7.6mcg.   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  06/01/2017- Capsule Endoscopy- normal except  tiny angiectasia, nonbleeding in the right colon.  Admitted 2/6-2/18/2019 with recurrent volume overload, RHF and ascites despite dobutamine support. Underwent paracentesis on 2/7 and 2/12. He had over 11 liters removed from paracentesis. Diuresed with IV lasix and transitioned back to home torsemide 100 mg/50mg  daily. Overall he lost 47 pounds.  Also treated for hepatic encephalopathy. MAPs were soft with diuresis so midodrine increased to 10 tid. Weight on d/c 195 pounds.   Admitted from clinic 01/10/18-01/27/18 with encephalopathy and acute on chronic CHF. Pt had somehow disconnected his dobutamine.  Weight up 30+ pounds from baseline. Dobutamine restarted and patient diuresed with lasix gtt and metolazone.  Overall, Pt down 31 lb from admit in setting of diuresis and paracentesis.   Admitted from clinic 02/23/18 - 03/06/18 with volume overload and 40 lb weight gain. Had been discharged from SNF as above on 5/18. He had been drinking lots of fluid. Initially had poor response to IV diuretics, required lasix gtt + diamox + metolazone for diuresis. Transitioned to torsemide 100 mg BID + metolazone 2x/week for home. Dobutamine continued at 7.5 mcg/kg/min. He diuresed 30 lbs. ICD adjusted by Dr. Lovena Le due to pacer spiker in his QRS. Palliative care  consulted and patient made DNR/DNI and patient arranged for long term placement at [facility].  Hospital course additionally complicated with open wound on chest. WOCN consulted and culture sent. Placed on doxy/ finished 03/07/18.   Today he returns 1 week VAD follow up. Recently given 2URPBCs for ongoing anemia. At [facility]. Remains on dobutamine 7.5 and midodrine 10 tid. On torsemide 100 bd and metolazone 2.5 daily. Weight down 4 pounds. Feels better but still bloated and edematous. Abdomen distended. No encephalopathy. Denies orthopnea or PND. No fevers, chills or problems with driveline. No bleeding, melena or neuro symptoms. No VAD alarms. Taking all meds as prescribed with help of [facility] staff.   Vital Signs:  Doppler Pressure: 80 Automatc BP:  94/61 (96) HR: 65 SPO2: 96 % on RA  Weight: 209.6 bs with eqt Last weight: 213.6 lbs with eqt  VAD Indication: Destination Therapy - eval complete at [facility]  VAD interrogation & Equipment Management: Speed:5800 Flow: 5.5 Power:4.5w PI: 2.0 Hct: 28  Alarms: several no external powers, pt states that he double disconnects at times. Also has low voltage advisories at times.  Events: 0 - 5 PI  daily  Fixed speed 5800 Low speed limit: 5500  Primary Controller: Replace back up battery in 19 months. Back up controller: Replace back up battery in 14 months.  Annual Equipment Maintenance on UBC/PM was performed on 09/2017.  Past Medical History:  Diagnosis Date  . AICD (automatic cardioverter/defibrillator) present   . Anxiety   . ASCVD (arteriosclerotic cardiovascular disease)   . Chronic systolic CHF (congestive heart failure) ([place])   . COPD (chronic obstructive pulmonary disease) ([place])   .  Coronary artery disease   . Depression   . GI bleed   . High cholesterol   . History of blood transfusion   . History of cocaine abuse   . Hypertension   . LVAD (left ventricular assist device) present ([place])   . Presence of  permanent cardiac pacemaker   . Shortness of breath dyspnea   . Suicidal ideation   . Tobacco abuse   . Type II diabetes mellitus ([place])     Current Outpatient Medications  Medication Sig Dispense Refill  . acetaminophen (TYLENOL) 500 MG tablet Take 1,000 mg by mouth every 6 (six) hours as needed for moderate pain or headache.    Marland Kitchen amiodarone (PACERONE) 200 MG tablet TAKE ONE TABLET BY MOUTH ONCE DAILY 60 tablet 6  . citalopram (CELEXA) 20 MG tablet Take 1 tablet (20 mg total) by mouth daily. 30 tablet 6  . cyclobenzaprine (FLEXERIL) 10 MG tablet Take 1 tablet (10 mg total) by mouth 3 (three) times daily as needed for muscle spasms. 30 tablet 1  . digoxin (DIGOX) 0.125 MG tablet Take 1 tablet (0.125 mg total) daily by mouth. 30 tablet 6  . DOBUTamine (DOBUTREX) 4-5 MG/ML-% infusion Inject 714.75 mcg/min into the vein continuous. PER [facility] pharmacy 250 mL 11  . docusate sodium (COLACE) 100 MG capsule Take 200 mg by mouth at bedtime as needed for mild constipation.     . gabapentin (NEURONTIN) 300 MG capsule Take 1 capsule (300 mg total) by mouth 2 (two) times daily. 180 capsule 6  . insulin aspart (NOVOLOG FLEXPEN) 100 UNIT/ML FlexPen Inject 5 Units into the skin 3 (three) times daily with meals. 15 mL 11  . Insulin Glargine (LANTUS SOLOSTAR) 100 UNIT/ML Solostar Pen Inject 10 Units into the skin daily at 10 pm. 1 pen 6  . lactulose (CHRONULAC) 10 GM/15ML solution Take 67.5 mLs (45 g total) by mouth 3 (three) times daily. 240 mL 6  . levothyroxine (SYNTHROID, LEVOTHROID) 50 MCG tablet Take 1 tablet (50 mcg total) by mouth daily before breakfast. 30 tablet 6  . magnesium oxide (MAG-OX) 400 (241.3 Mg) MG tablet Take 1 tablet (400 mg total) by mouth daily. 30 tablet 6  . metolazone (ZAROXOLYN) 2.5 MG tablet Take 2.5 mg by mouth daily.    . midodrine (PROAMATINE) 10 MG tablet Take 1 tablet (10 mg total) by mouth 3 (three) times daily with meals. 90 tablet 6  . Multiple Vitamin (MULTIVITAMIN WITH  MINERALS) TABS tablet Take 1 tablet by mouth daily.    . ondansetron (ZOFRAN ODT) 4 MG disintegrating tablet Take 1 tablet (4 mg total) by mouth every 8 (eight) hours as needed for nausea. 6 tablet 0  . pantoprazole (PROTONIX) 40 MG tablet TAKE 1 TABLET BY MOUTH TWICE DAILY WITH A MEAL 60 tablet 6  . potassium chloride SA (K-DUR,KLOR-CON) 20 MEQ tablet Take 4 tablets (80 mEq total) by mouth 2 (two) times daily. Take 40 meq in am and 20 meq in pm 120 tablet 3  . senna-docusate (SENOKOT S) 8.6-50 MG tablet Take 2 tablets by mouth at bedtime as needed for mild constipation. 30 tablet 6  . spironolactone (ALDACTONE) 25 MG tablet Take 1 tablet (25 mg total) by mouth daily. 30 tablet 6  . sucralfate (CARAFATE) 1 GM/10ML suspension Take 10 mLs (1 g total) by mouth 4 (four) times daily -  with meals and at bedtime. 420 mL 6  . torsemide (DEMADEX) 100 MG tablet Take 1 tablet (100 mg total) by  mouth 2 (two) times daily. Take 100mg  in the morning and 50mg  in the evening    . traZODone (DESYREL) 50 MG tablet Take 1 tablet (50 mg total) by mouth at bedtime as needed for sleep. 30 tablet 6  . warfarin (COUMADIN) 5 MG tablet Take 5mg  (1tab) Monday, Wednesday and Friday, take 7.5mg  (1.5tab) Tuesday, Thursday, Saturday and Sunday 60 tablet 3   No current facility-administered medications for this encounter.     Chlorhexidine gluconate; Codeine; Lipitor [atorvastatin]; and Tape  Review of systems complete and found to be negative unless listed in HPI.     Vitals:   03/28/18 0936 03/28/18 0938  BP: 94/61 (!) 80/0  Pulse: 65   SpO2: 96%    Wt Readings from Last 3 Encounters:  03/28/18 209 lb 9.6 oz (95.1 kg)  03/21/18 213 lb 9.6 oz (96.9 kg)  03/14/18 219 lb (99.3 kg)    Vital Signs:   Weight: 213.6   w/o eqt Last weight: 217  lbs    Physical Exam: General:  NAD. Weak appearing HEENT: normal except scleral icterus To ear  Carotids 2+ bilat; no bruits. No lymphadenopathy or thryomegaly  appreciated. Cor: LVAD hum.  Lungs: Clear. Abdomen: obese soft, nontender, ++ distended. No hepatosplenomegaly. No bruits or masses. Good bowel sounds. Driveline site clean. Anchor in place.  Extremities: no cyanosis, clubbing, rash. Warm 2-3+ edema  Neuro: alert & oriented x 3. No focal deficits. Moves all 4 without problem    ASSESSMENT AND PLAN: 1. Chronic end-stage biventricular systolic HF: LVEF 74% due to ICM -> s/p Echo 08/24/16 LVEF 15%, RV mild dilated, moderately reduced. 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  Has been turned down for transplant at [facility].  - Complicated by RV failure. Failed milrinone due to persistent RV failure/shock..   - Chronic NYHA IIIB-IV despite VAD support. Continue dobutamine 7.5 mcg and midodrine.  - Volume status improving slowly but still 15-20 pounds above baseline. Continue torsemide 100 mg BID + metolazone 2.5 daily - Continue midodrine 10 TID for low MAPs and cirrhotic physiology. MAPs stable today   - Off sildenafil due to hypotension - No beta blocker with RV failure.  - VAD interrogated personally. Parameters stable. - Warfarin with goal INR 2.0 - 2.5 + ASA 81 daily.  - INR today 1.41. Discussed dosing with PharmD personally. - LDH stable 149 - Driveline OK.  - Not transplant candidate due to social issues, noncompliance and cirrhosis (has been turned down by [facility]) 2. CAD: Severe 3v- CAD s/p CABG with occluded grafts as above except for LIMA.  -  No s/s ischemia  - Continue current therapy.  3. DMII:  - Per PCP 4. Tobacco abuse:  - Complete cessation.  5. Atrial fibrillation, paroxysmal - Continue amio 200 daily. Careful with cirrhosis  6.  Anxiety/Depression - Continue Celexa 20mg  daily . - Social issues still a concern, though now long-term SNF.  7. Cirrhosis with hepatic encephalopathy - likely combination ETOH and chronic RV failure - No asterixis on exam today. Ammonia 52.  . Continue lactulose at 45 tid - Can schedule paracentesis as  needed 10. Anemia, iron-deficiency:   - Has had GI work up with EGD and Capsule Endoscopy showing severe gastropath  - Last week received 2UPRBCS. Hgb 7.9 No obvious source of bleeding. -  - Repeat transfusion as needed.  11. DNR/DNI  Total time spent 45 minutes. Over half that time spent discussing above.   Glori Bickers, MD  7:37 PM

## 2018-03-28 NOTE — Patient Instructions (Addendum)
1. Increase Metolazone to 2.5 mg Mon/Wed/Fri. 2. Give coumadin 10 mg today then resume 7.5 mg daily except 5 mg on MWF 3. Increase K-Dur to 80 meq bid 4. Return to [place] clinic in one week.

## 2018-03-28 NOTE — Progress Notes (Addendum)
Pt presents to clinic for his 1 week follow up per transportation from [facility]. Pt in wheelchair, needs assistance to stand for weight and transfer to bed.   Vital Signs:  Doppler Pressure: 80 Automatc BP:  94/61 (96) HR: 65 SPO2: 96 % on RA  Weight: 209.6 bs with eqt Last weight: 213.6 lbs with eqt  VAD Indication: Destination Therapy - eval complete at [facility]  VAD interrogation & Equipment Management: Speed:5800 Flow: 5.5 Power:4.5w    PI: 2.0 Hct: 28  Alarms: several no external powers, pt states that he double disconnects at times. Also has low voltage advisories at times.  Events: 0 - 5 PI  daily  Fixed speed 5800 Low speed limit: 5500  Primary Controller:  Replace back up battery in 19 months. Back up controller:  Replace back up battery in 14 months.  Annual Equipment Maintenance on UBC/PM was performed on 09/2017.   I reviewed the LVAD parameters from today and compared the results to the patient's prior recorded data. LVAD interrogation was NEGATIVE for significant power changes, NEGATIVE for clinical alarms and STABLE for PI events/speed drops. No programming changes were made and pump is functioning within specified parameters. Pt is performing daily controller and system monitor self tests along with completing weekly and monthly maintenance for LVAD equipment.  LVAD equipment check completed and is in good working order. Back-up equipment not present today. Reminded patient he should keep black bag with him at all times.    Exit Site Care: VAD coordinators are managing driveline dressing.. Dressing dry and intact with anchor intact and accurately applied. Dressing removed and site cleaned with sterile saline and betadine, velour fully implanted at exit site. Site with no redness, tenderness, drainage, or rash noted. Pt will need weekly dressing changes.    Significant Events on VAD Support:  11/30/16> Milrinone gtt at discharge 01/27/17>dobutamine at 5  mcg/kg/mingtt at discharge 03/08/17> RV failure 04/29/17> symptomatic anemia and melena. EGD revealed stomach ulcers. Carafate added; ASA stopped 05/31/17>Ongoing symptomatic anemia and melena; capsule study mild antral gastritis; poss tiny AVM right colon. Lower MAPS, Losartan cut back. 06/2017> admit with syncope, AMS, Dobutamine at 7.26mcg 11/09/17>Marked ascites, RV failure, and hepatic encephalopathy. Paracentesis on 2/7 and 2/12 with >11 liters fluid removed  Device:Protect Therapies: off; end of service 08/2016 Last check: complete interrogation 05/26/2017. DDD (lower rate 60); BiV pacing with 96% AS-VP  BP & Labs:  Doppler 80  -  is reflecting MAP  Hgb 7.9 - No S/S of bleeding. Specifically denies melena/BRBPR or nosebleeds.  LDH stable at 149 with established baseline of 150- 250. Denies tea-colored urine. No power elevations noted on interrogation.   Written instructions (below) sent to [facility].  Patient Instructions: 1. Increase Metolazone to 2.5 mg Mon/Wed/Fri. 2. Give coumadin 10 mg today then resume 7.5 mg daily except 5 mg on MWF 3. Increase K-Dur to 80 meq bid 4. Return to [place] clinic in one week.   Zada Girt, RN VAD Coordinator   Office: [phone] 24/7 Emergency VAD Pager: [phone]

## 2018-04-02 ENCOUNTER — Inpatient Hospital Stay (HOSPITAL_COMMUNITY)
Admission: EM | Admit: 2018-04-02 | Discharge: 2018-04-08 | DRG: 291 | Disposition: A | Discharge status: Skilled Nursing Facility | Payer: Medicare HMO | Attending: Internal Medicine | Admitting: Internal Medicine

## 2018-04-02 ENCOUNTER — Emergency Department (HOSPITAL_COMMUNITY): Payer: Medicare HMO

## 2018-04-02 ENCOUNTER — Other Ambulatory Visit: Payer: Self-pay

## 2018-04-02 ENCOUNTER — Encounter (HOSPITAL_COMMUNITY): Payer: Self-pay

## 2018-04-02 DIAGNOSIS — I5023 Acute on chronic systolic (congestive) heart failure: Secondary | ICD-10-CM | POA: Diagnosis present

## 2018-04-02 DIAGNOSIS — Z9119 Patient's noncompliance with other medical treatment and regimen: Secondary | ICD-10-CM

## 2018-04-02 DIAGNOSIS — Z95811 Presence of heart assist device: Secondary | ICD-10-CM

## 2018-04-02 DIAGNOSIS — Z833 Family history of diabetes mellitus: Secondary | ICD-10-CM

## 2018-04-02 DIAGNOSIS — E871 Hypo-osmolality and hyponatremia: Secondary | ICD-10-CM | POA: Diagnosis present

## 2018-04-02 DIAGNOSIS — Z79899 Other long term (current) drug therapy: Secondary | ICD-10-CM

## 2018-04-02 DIAGNOSIS — N179 Acute kidney failure, unspecified: Secondary | ICD-10-CM

## 2018-04-02 DIAGNOSIS — K7031 Alcoholic cirrhosis of liver with ascites: Secondary | ICD-10-CM

## 2018-04-02 DIAGNOSIS — I2510 Atherosclerotic heart disease of native coronary artery without angina pectoris: Secondary | ICD-10-CM | POA: Diagnosis present

## 2018-04-02 DIAGNOSIS — Z9981 Dependence on supplemental oxygen: Secondary | ICD-10-CM

## 2018-04-02 DIAGNOSIS — E875 Hyperkalemia: Secondary | ICD-10-CM | POA: Diagnosis present

## 2018-04-02 DIAGNOSIS — Z7901 Long term (current) use of anticoagulants: Secondary | ICD-10-CM | POA: Diagnosis not present

## 2018-04-02 DIAGNOSIS — F329 Major depressive disorder, single episode, unspecified: Secondary | ICD-10-CM | POA: Diagnosis present

## 2018-04-02 DIAGNOSIS — I480 Paroxysmal atrial fibrillation: Secondary | ICD-10-CM | POA: Diagnosis present

## 2018-04-02 DIAGNOSIS — E1122 Type 2 diabetes mellitus with diabetic chronic kidney disease: Secondary | ICD-10-CM | POA: Diagnosis present

## 2018-04-02 DIAGNOSIS — R188 Other ascites: Secondary | ICD-10-CM | POA: Diagnosis present

## 2018-04-02 DIAGNOSIS — E669 Obesity, unspecified: Secondary | ICD-10-CM | POA: Diagnosis present

## 2018-04-02 DIAGNOSIS — N183 Chronic kidney disease, stage 3 (moderate): Secondary | ICD-10-CM | POA: Diagnosis present

## 2018-04-02 DIAGNOSIS — K7290 Hepatic failure, unspecified without coma: Secondary | ICD-10-CM | POA: Diagnosis present

## 2018-04-02 DIAGNOSIS — Z95810 Presence of automatic (implantable) cardiac defibrillator: Secondary | ICD-10-CM | POA: Diagnosis not present

## 2018-04-02 DIAGNOSIS — Z951 Presence of aortocoronary bypass graft: Secondary | ICD-10-CM

## 2018-04-02 DIAGNOSIS — Z87891 Personal history of nicotine dependence: Secondary | ICD-10-CM

## 2018-04-02 DIAGNOSIS — Z66 Do not resuscitate: Secondary | ICD-10-CM | POA: Diagnosis present

## 2018-04-02 DIAGNOSIS — Z6831 Body mass index (BMI) 31.0-31.9, adult: Secondary | ICD-10-CM

## 2018-04-02 DIAGNOSIS — J449 Chronic obstructive pulmonary disease, unspecified: Secondary | ICD-10-CM | POA: Diagnosis present

## 2018-04-02 DIAGNOSIS — E7800 Pure hypercholesterolemia, unspecified: Secondary | ICD-10-CM | POA: Diagnosis present

## 2018-04-02 DIAGNOSIS — Z794 Long term (current) use of insulin: Secondary | ICD-10-CM | POA: Diagnosis not present

## 2018-04-02 DIAGNOSIS — Z8249 Family history of ischemic heart disease and other diseases of the circulatory system: Secondary | ICD-10-CM

## 2018-04-02 DIAGNOSIS — Z79890 Hormone replacement therapy (postmenopausal): Secondary | ICD-10-CM | POA: Diagnosis not present

## 2018-04-02 DIAGNOSIS — I5084 End stage heart failure: Secondary | ICD-10-CM | POA: Diagnosis present

## 2018-04-02 DIAGNOSIS — I130 Hypertensive heart and chronic kidney disease with heart failure and stage 1 through stage 4 chronic kidney disease, or unspecified chronic kidney disease: Principal | ICD-10-CM | POA: Diagnosis present

## 2018-04-02 DIAGNOSIS — D509 Iron deficiency anemia, unspecified: Secondary | ICD-10-CM | POA: Diagnosis present

## 2018-04-02 DIAGNOSIS — K7460 Unspecified cirrhosis of liver: Secondary | ICD-10-CM | POA: Diagnosis present

## 2018-04-02 DIAGNOSIS — I50810 Right heart failure, unspecified: Secondary | ICD-10-CM | POA: Diagnosis not present

## 2018-04-02 DIAGNOSIS — E876 Hypokalemia: Secondary | ICD-10-CM | POA: Diagnosis not present

## 2018-04-02 DIAGNOSIS — F419 Anxiety disorder, unspecified: Secondary | ICD-10-CM | POA: Diagnosis present

## 2018-04-02 LAB — COOXEMETRY PANEL
Carboxyhemoglobin: 2.3 % — ABNORMAL HIGH (ref 0.5–1.5)
Methemoglobin: 0.6 % (ref 0.0–1.5)
O2 Saturation: 52.3 %
Total hemoglobin: 7.7 g/dL — ABNORMAL LOW (ref 12.0–16.0)

## 2018-04-02 LAB — CBC WITH DIFFERENTIAL/PLATELET
Abs Immature Granulocytes: 0 10*3/uL (ref 0.0–0.1)
BASOS ABS: 0 10*3/uL (ref 0.0–0.1)
BASOS PCT: 0 %
Eosinophils Absolute: 0 10*3/uL (ref 0.0–0.7)
Eosinophils Relative: 0 %
HCT: 27.3 % — ABNORMAL LOW (ref 39.0–52.0)
HEMOGLOBIN: 7.5 g/dL — AB (ref 13.0–17.0)
Immature Granulocytes: 0 %
Lymphocytes Relative: 5 %
Lymphs Abs: 0.4 10*3/uL — ABNORMAL LOW (ref 0.7–4.0)
MCH: 21.7 pg — AB (ref 26.0–34.0)
MCHC: 27.5 g/dL — AB (ref 30.0–36.0)
MCV: 78.9 fL (ref 78.0–100.0)
MONO ABS: 1.1 10*3/uL — AB (ref 0.1–1.0)
Monocytes Relative: 12 %
NEUTROS ABS: 8.2 10*3/uL — AB (ref 1.7–7.7)
NEUTROS PCT: 83 %
PLATELETS: 189 10*3/uL (ref 150–400)
RBC: 3.46 MIL/uL — ABNORMAL LOW (ref 4.22–5.81)
RDW: 18.8 % — AB (ref 11.5–15.5)
WBC: 9.9 10*3/uL (ref 4.0–10.5)

## 2018-04-02 LAB — LACTATE DEHYDROGENASE: LDH: 186 U/L (ref 98–192)

## 2018-04-02 LAB — COMPREHENSIVE METABOLIC PANEL
ALT: 18 U/L (ref 0–44)
ANION GAP: 13 (ref 5–15)
AST: 31 U/L (ref 15–41)
Albumin: 3.4 g/dL — ABNORMAL LOW (ref 3.5–5.0)
Alkaline Phosphatase: 100 U/L (ref 38–126)
BUN: 43 mg/dL — AB (ref 8–23)
CO2: 25 mmol/L (ref 22–32)
Calcium: 9 mg/dL (ref 8.9–10.3)
Chloride: 87 mmol/L — ABNORMAL LOW (ref 98–111)
Creatinine, Ser: 2.27 mg/dL — ABNORMAL HIGH (ref 0.61–1.24)
GFR, EST AFRICAN AMERICAN: 34 mL/min — AB (ref >60)
GFR, EST NON AFRICAN AMERICAN: 29 mL/min — AB (ref >60)
Glucose, Bld: 247 mg/dL — ABNORMAL HIGH (ref 70–99)
POTASSIUM: 6.5 mmol/L — AB (ref 3.5–5.1)
Sodium: 125 mmol/L — ABNORMAL LOW (ref 135–145)
TOTAL PROTEIN: 7.3 g/dL (ref 6.5–8.1)
Total Bilirubin: 1.1 mg/dL (ref 0.3–1.2)

## 2018-04-02 LAB — AMMONIA: Ammonia: 59 umol/L — ABNORMAL HIGH (ref 9–35)

## 2018-04-02 LAB — PROTIME-INR
INR: 2.3
Prothrombin Time: 25.1 seconds — ABNORMAL HIGH (ref 11.4–15.2)

## 2018-04-02 LAB — MAGNESIUM: Magnesium: 2.2 mg/dL (ref 1.7–2.4)

## 2018-04-02 LAB — DIGOXIN LEVEL: DIGOXIN LVL: 1.6 ng/mL (ref 0.8–2.0)

## 2018-04-02 MED ORDER — ONDANSETRON HCL 4 MG/2ML IJ SOLN: 4.0000 mg | Freq: Four times a day (QID) | INTRAMUSCULAR | Status: DC | PRN

## 2018-04-02 MED ORDER — ONDANSETRON HCL 4 MG/2ML IJ SOLN
4.0000 mg | Freq: Once | INTRAMUSCULAR | Status: AC
  Administered 2018-04-02: 4 mg via INTRAVENOUS
  Filled 2018-04-02: qty 2

## 2018-04-02 MED ORDER — SODIUM POLYSTYRENE SULFONATE 15 GM/60ML PO SUSP
45.0000 g | Freq: Once | ORAL | Status: AC
  Administered 2018-04-02: 45 g via ORAL
  Filled 2018-04-02: qty 180

## 2018-04-02 MED ORDER — FUROSEMIDE 10 MG/ML IJ SOLN
80.0000 mg | Freq: Once | INTRAMUSCULAR | Status: AC
  Administered 2018-04-02: 80 mg via INTRAVENOUS
  Filled 2018-04-02: qty 8

## 2018-04-02 NOTE — Progress Notes (Signed)
LVAD cart taken to ED.  Pt attached to cart.  LVAD numbers and mean documented.  LVAD coordinator notified of pts arrival and LVAD numbers.  LVAD coordinator to notify HF team of patients admission to ED.

## 2018-04-02 NOTE — H&P (Signed)
Advanced Heart Failure/VAD Team H&P    Primary Care: [facility] Primary Cardiologist: Dr Haroldine Laws.   Reason for admission: Acute on chronic systolic HF and hyperkalemia  HPI: Ronald Miller. is a 62 y.o. male with history of chronic systolic HF s/p Medtr[employer] ICD 2014, CAD s/p CABG in 2000, HTN, Hx of cocaine abuse, Tobacco abuse, Depression, PAF, and COPD. HM3 placed 09/09/2016. In April 2018 he was placed on milrinone but later switched to dobutamine. He remains on dobutamine 7.22mcg.   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  06/01/2017- Capsule Endoscopy- normal except  tiny angiectasia, nonbleeding in the right colon.  Admitted 2/6-2/18/2019 with recurrent volume overload, RHF and ascites despite dobutamine support. Underwent paracentesis on 2/7 and 2/12. He had over 11 liters removed from paracentesis. Diuresed with IV lasix and transitioned back to home torsemide 100 mg/50mg  daily. Overall he lost 47 pounds.  Also treated for hepatic encephalopathy. MAPs were soft with diuresis so midodrine increased to 10 tid. Weight on d/c 195 pounds.   Admitted from clinic 01/10/18-01/27/18 with encephalopathy and acute on chronic CHF. Pt had somehow disconnected his dobutamine.  Weight up 30+ pounds from baseline. Dobutamine restarted and patient diuresed with lasix gtt and metolazone.  Overall, Pt down 31 lb from admit in setting of diuresis and paracentesis.   Admitted from clinic 02/23/18 - 03/06/18 with volume overload and 40 lb weight gain. Had been discharged from SNF as above on 5/18. He had been drinking lots of fluid. Initially had poor response to IV diuretics, required lasix gtt + diamox + metolazone for diuresis. Transitioned to torsemide 100 mg BID + metolazone 2x/week for home. Dobutamine continued at 7.5 mcg/kg/min. He diuresed 30 lbs. ICD adjusted by Dr. Lovena Le due to pacer spiker in his QRS. Palliative  care consulted and patient made DNR/DNI and patient arranged for long term placement at [facility].  Hospital course additionally complicated with open wound on chest. WOCN consulted and culture sent. Placed on doxy/ finished 03/07/18. Discharged back to [facility]. D/c weight 190 pounds  On 6/11 weight up to 219. Diuretics adjusted. Seen in Clinic on 6/25 and was volume overloaded but weight was coming down slowly on torsemide 100 bid + metolazone 2.5 twice a week. Weight 209. Was complaining about fluid restriction. Over the past few days, says he has had nausea and dry heaves. Belly more distended and more difficult to breathe. Loose stools due to lactulose. Weight up to 215 so brought to ER. Initial plan was for paracentesis in ER and IV lasix with return to SNF. However unable to get any significant amout of fluid with paracentesis and K 6.3 with creatinine up from 1.8->2.3 and Na 125 so he was admitted.   In ER, CXR clear. Lying flat in bed without SOB. INR 2.30  LDH 186  MAPs 70s   Heartmate-3 LVAD  Flow 5.4L Speed 5800 PI 2.0 Power 4.4 W VAD interrogated personally. Parameters stable.  Review of Systems: [y] = yes, [ ]  = no    General: Weight gain [y]; Weight loss [ ] ; Anorexia [ ] ; Fatigue [y]; Fever [ ] ; Chills [ ] ; Weakness [name] ]   Cardiac: Chest pain/pressure [ ] ; Resting SOB [ y]; Exertional SOB [y]; Orthopnea [ ] ; Pedal Edema [] ; Palpitations [ ] ; Syncope [ ] ; Presyncope [ ] ; Paroxysmal nocturnal dyspnea[ ]    Pulmonary: Cough [name] ]; Wheezing[ ] ; Hemoptysis[ ] ; Sputum [ ] ;  Snoring [ ]    GI: Vomiting[ ] ; Dysphagia[ ] ; Melena[ ] ; Hematochezia [ ] ; Heartburn[ ] ; Abdominal pain [name] ]; Constipation [ ] ; Diarrhea [ y]; BRBPR [ ]    GU: Hematuria[ ] ; Dysuria [ ] ; Nocturia[ ]   Vascular: Pain in legs with walking [ ] ; Pain in feet with lying flat [ ] ; Non-healing sores [ ] ; Stroke [ ] ; TIA [ ] ; Slurred speech [ ] ;   Neuro: Headaches[ ] ; Vertigo[ ] ; Seizures[ ] ; Paresthesias[  ];Blurred vision [ ] ; Diplopia [ ] ; Vision changes [ ]    Ortho/Skin: Arthritis [y]; Joint pain [y]; Muscle pain y[ ] ; Joint swelling [ ] ; Back Pain [ ] ; Rash [ ]    Psych: Depression[y ]; Anxiety[y ]   Heme: Bleeding problems [ ] ; Clotting disorders [ ] ; Anemia [name] ]   Endocrine: Diabetes [ y]; Thyroid dysfunction[ ]     Past Medical History:  Diagnosis Date  . AICD (automatic cardioverter/defibrillator) present   . Anxiety   . ASCVD (arteriosclerotic cardiovascular disease)   . Chronic systolic CHF (congestive heart failure) ([place])   . COPD (chronic obstructive pulmonary disease) ([place])   . Coronary artery disease   . Depression   . GI bleed   . High cholesterol   . History of blood transfusion   . History of cocaine abuse   . Hypertension   . LVAD (left ventricular assist device) present ([place])   . Presence of permanent cardiac pacemaker   . Shortness of breath dyspnea   . Suicidal ideation   . Tobacco abuse   . Type II diabetes mellitus ([place])     No current facility-administered medications for this encounter.    Current Outpatient Medications  Medication Sig Dispense Refill  . acetaminophen (TYLENOL) 500 MG tablet Take 1,000 mg by mouth every 6 (six) hours as needed for moderate pain.     Marland Kitchen amiodarone (PACERONE) 200 MG tablet TAKE ONE TABLET BY MOUTH ONCE DAILY 60 tablet 6  . citalopram (CELEXA) 20 MG tablet Take 1 tablet (20 mg total) by mouth daily. 30 tablet 6  . cyclobenzaprine (FLEXERIL) 10 MG tablet Take 1 tablet (10 mg total) by mouth 3 (three) times daily as needed for muscle spasms. (Patient taking differently: Take 10 mg by mouth every 8 (eight) hours as needed for muscle spasms. ) 30 tablet 1  . digoxin (DIGOX) 0.125 MG tablet Take 1 tablet (0.125 mg total) daily by mouth. 30 tablet 6  . DOBUTamine (DOBUTREX) 4-5 MG/ML-% infusion Inject 714.75 mcg/min into the vein continuous. PER [facility] pharmacy 250 mL 11  . docusate sodium (COLACE) 100 MG capsule Take 100 mg  by mouth at bedtime as needed (constipation).     . gabapentin (NEURONTIN) 300 MG capsule Take 1 capsule (300 mg total) by mouth 2 (two) times daily. 180 capsule 6  . insulin aspart (NOVOLOG FLEXPEN) 100 UNIT/ML FlexPen Inject 5 Units into the skin 3 (three) times daily with meals. 15 mL 11  . Insulin Detemir (LEVEMIR FLEXTOUCH) 100 UNIT/ML Pen Inject 10 Units into the skin at bedtime.    Marland Kitchen lactulose (CHRONULAC) 10 GM/15ML solution Take 67.5 mLs (45 g total) by mouth 3 (three) times daily. 240 mL 6  . levothyroxine (SYNTHROID, LEVOTHROID) 50 MCG tablet Take 1 tablet (50 mcg total) by mouth daily before breakfast. 30 tablet 6  . magnesium oxide (MAG-OX) 400 (241.3 Mg) MG tablet Take 1 tablet (400 mg total) by mouth daily. 30 tablet 6  . metolazone (ZAROXOLYN) 2.5 MG tablet Take 2.5  mg by mouth See admin instructions. Take 1 tablet (2.5 mg) on Mondays and Fridays    . midodrine (PROAMATINE) 10 MG tablet Take 1 tablet (10 mg total) by mouth 3 (three) times daily with meals. 90 tablet 6  . Multiple Vitamin (MULTIVITAMIN WITH MINERALS) TABS tablet Take 1 tablet by mouth daily. Thera-M    . ondansetron (ZOFRAN ODT) 4 MG disintegrating tablet Take 1 tablet (4 mg total) by mouth every 8 (eight) hours as needed for nausea. (Patient taking differently: Take 4 mg by mouth every 8 (eight) hours as needed for nausea or vomiting. ) 6 tablet 0  . OXYGEN Inhale 2 L into the lungs as needed (for SATS below 90%).    . pantoprazole (PROTONIX) 40 MG tablet TAKE 1 TABLET BY MOUTH TWICE DAILY WITH A MEAL 60 tablet 6  . potassium chloride SA (K-DUR,KLOR-CON) 20 MEQ tablet Take 4 tablets (80 mEq total) by mouth 2 (two) times daily. Take 40 meq in am and 20 meq in pm (Patient taking differently: Take 20-40 mEq by mouth See admin instructions. Take 2 tablets (40 meq) by mouth every morning and 1 tablet (20 meq) every evening) 120 tablet 3  . senna-docusate (SENOKOT S) 8.6-50 MG tablet Take 2 tablets by mouth at bedtime as  needed for mild constipation. 30 tablet 6  . spironolactone (ALDACTONE) 25 MG tablet Take 1 tablet (25 mg total) by mouth daily. 30 tablet 6  . sucralfate (CARAFATE) 1 GM/10ML suspension Take 10 mLs (1 g total) by mouth 4 (four) times daily -  with meals and at bedtime. 420 mL 6  . torsemide (DEMADEX) 100 MG tablet Take 1 tablet (100 mg total) by mouth 2 (two) times daily. Take 100mg  in the morning and 50mg  in the evening (Patient taking differently: Take 100 mg by mouth 2 (two) times daily. )    . warfarin (COUMADIN) 5 MG tablet Take 5mg  (1tab) Monday, Wednesday and Friday, take 7.5mg  (1.5tab) Tuesday, Thursday, Saturday and Sunday (Patient taking differently: No sig reported) 60 tablet 3  . Insulin Glargine (LANTUS SOLOSTAR) 100 UNIT/ML Solostar Pen Inject 10 Units into the skin daily at 10 pm. (Patient not taking: Reported on 04/02/2018) 1 pen 6  . traZODone (DESYREL) 50 MG tablet Take 1 tablet (50 mg total) by mouth at bedtime as needed for sleep. 30 tablet 6    Chlorhexidine gluconate; Codeine; Lipitor [atorvastatin]; and Tape  Review of systems complete and found to be negative unless listed in HPI.     Vitals:   04/02/18 1818  Pulse: 80  Resp: (!) 22  Temp: 98 F (36.7 C)  SpO2: 93%   Wt Readings from Last 3 Encounters:  04/02/18 97.5 kg (215 lb)  03/28/18 95.1 kg (209 lb 9.6 oz)  03/21/18 96.9 kg (213 lb 9.6 oz)    Vital Signs:   MAPs 70s  Physical Exam: General:  Chronically ill appearing NAD HEENT: normal ? Mild scleral icterus Neck: supple. JVP to jaw Carotids 2+ bilat; no bruits. No lymphadenopathy or thryomegaly appreciated. Cor: LVAD hum.  Lungs: Clear. Abdomen: obese soft, nontender, + distended. No hepatosplenomegaly. No bruits or masses. Good bowel sounds. Driveline site clean. Anchor in place.  Extremities: no cyanosis, clubbing, rash. Warm chronic 1-2+ edema  Neuro: alert & oriented x 3. No focal deficits. Moves all 4 without problem    ASSESSMENT AND  PLAN: 1. Acute on Chronic end-stage biventricular systolic HF with R>>L HF: LVEF 15% due to ICM -> s/p Echo  08/24/16 LVEF 15%, RV mild dilated, moderately reduced. 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  Has been turned down for transplant at [facility].  - Echo 2/18 EF 15. Severe RV dysfunction - Complicated by RV failure. Failed milrinone due to persistent RV failure/shock..   - Chronic NYHA IIIB-IV despite VAD support. Continue dobutamine 7.5 mcg and midodrine.  - On exam volume status does not seem markedly elevated from his baseline though he is up 6 pounds and abdomen is more distended but not tight. Paracentesis attempted in ER but was unsuccessful  - Will start IV lasix and assess response - usually requires high-dose lasix gtt - Continue midodrine 10 TID for BP support in setting of cirrhotic physiology - Off sildenafil due to hypotension - No beta blocker with RV failure  2. Acute on chronic renal failure (CKD3 at baseline) - Baseline creatinine 1.5-1.6 - Up to 2.3 today with increased po diuretics as outpatient - Hopefully will improved with IV diuresis. Follow BMETs closely  3. Hyperkalemia - Kayexalate and IV lasix given in ER. Potassium and spiro held - Will repeat BMET at 11pm  4. Hyponatremia - Will follow. Suspect he may need tolvaptan in am - Free water restrict  5. Heartmate-3 LVAD - VAD interrogated personally. Parameters stable. - INR today 2.30. Warfarin per [facility] - LDH stable [place].  - Not transplant candidate due to social issues, noncompliance and cirrhosis (has been turned down by [facility])  6. Cirrhosis with ascites and hepatic encephalopathy - IR paracentesis tomorrow - Encephalopathy controlled with lactulose  7. CAD: Severe 3v- CAD s/p CABG with occluded grafts as above except for LIMA.  -  No s/s ischemia  - Continue current therapy.   8. DMII:  - Cover with SSI .  9. Atrial fibrillation, paroxysmal - Continue amio 200 daily. Careful with cirrhosis     10.  Anxiety/Depression - Continue Celexa 20mg  daily . - Social issues still a concern, though now long-term SNF.   11. Anemia, iron-deficient:   - Has had GI work up with EGD and Capsule Endoscopy showing severe gastropath  - Recently received 2UPRBCS as outpatient. Hgb 7.5 No obvious source of bleeding. -  - Repeat transfusion as needed.   12. DNR/DNI   Glori Bickers, MD  9:44 PM
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
## 2018-04-02 NOTE — ED Notes (Signed)
Patient asking blood to be pulled from IV line.

## 2018-04-02 NOTE — ED Notes (Signed)
Rapid response called to assist with transport

## 2018-04-02 NOTE — ED Triage Notes (Signed)
Pt arrives to ED from [facility] with complaints of abdominal distention, diarrhea, and nausea with vomiting since this morning. EMS reports pt has LVAD, normal sounds. Pt states recent 8 lb weight gain, abdomen distended and taught. EMS states the last time pt was here he got fluid drained from his abdomen. Pt nauseous and vomiting upon arrival. Pt placed in position of comfort with bed locked and lowered, call bell in reach.

## 2018-04-03 ENCOUNTER — Inpatient Hospital Stay (HOSPITAL_COMMUNITY): Payer: Medicare HMO

## 2018-04-03 ENCOUNTER — Encounter (HOSPITAL_COMMUNITY): Payer: Self-pay | Admitting: *Deleted

## 2018-04-03 DIAGNOSIS — I50810 Right heart failure, unspecified: Secondary | ICD-10-CM

## 2018-04-03 DIAGNOSIS — Z95811 Presence of heart assist device: Secondary | ICD-10-CM

## 2018-04-03 HISTORY — PX: IR PARACENTESIS: IMG2679

## 2018-04-03 LAB — COOXEMETRY PANEL
Carboxyhemoglobin: 2 % — ABNORMAL HIGH (ref 0.5–1.5)
METHEMOGLOBIN: 1.6 % — AB (ref 0.0–1.5)
O2 Saturation: 43.6 %
Total hemoglobin: 6.8 g/dL — CL (ref 12.0–16.0)

## 2018-04-03 LAB — PROTIME-INR
INR: 2.09
PROTHROMBIN TIME: 23.3 s — AB (ref 11.4–15.2)

## 2018-04-03 LAB — GLUCOSE, CAPILLARY
GLUCOSE-CAPILLARY: 227 mg/dL — AB (ref 70–99)
Glucose-Capillary: 166 mg/dL — ABNORMAL HIGH (ref 70–99)
Glucose-Capillary: 172 mg/dL — ABNORMAL HIGH (ref 70–99)
Glucose-Capillary: 169 mg/dL — ABNORMAL HIGH (ref 70–99)
Glucose-Capillary: 155 mg/dL — ABNORMAL HIGH (ref 70–99)

## 2018-04-03 LAB — PREPARE RBC (CROSSMATCH)

## 2018-04-03 LAB — BASIC METABOLIC PANEL
Anion gap: 10 (ref 5–15)
BUN: 43 mg/dL — AB (ref 8–23)
CALCIUM: 8.7 mg/dL — AB (ref 8.9–10.3)
CO2: 30 mmol/L (ref 22–32)
CREATININE: 2.1 mg/dL — AB (ref 0.61–1.24)
Chloride: 86 mmol/L — ABNORMAL LOW (ref 98–111)
GFR calc Af Amer: 37 mL/min — ABNORMAL LOW (ref >60)
GFR calc non Af Amer: 32 mL/min — ABNORMAL LOW (ref >60)
Glucose, Bld: 182 mg/dL — ABNORMAL HIGH (ref 70–99)
Potassium: 4.6 mmol/L (ref 3.5–5.1)
Sodium: 126 mmol/L — ABNORMAL LOW (ref 135–145)

## 2018-04-03 LAB — CBC
HCT: 25.6 % — ABNORMAL LOW (ref 39.0–52.0)
Hemoglobin: 7.1 g/dL — ABNORMAL LOW (ref 13.0–17.0)
MCH: 21.5 pg — AB (ref 26.0–34.0)
MCHC: 27.7 g/dL — AB (ref 30.0–36.0)
MCV: 77.3 fL — ABNORMAL LOW (ref 78.0–100.0)
Platelets: 162 10*3/uL (ref 150–400)
RBC: 3.31 MIL/uL — ABNORMAL LOW (ref 4.22–5.81)
RDW: 18.7 % — AB (ref 11.5–15.5)
WBC: 6.3 10*3/uL (ref 4.0–10.5)

## 2018-04-03 LAB — LACTATE DEHYDROGENASE: LDH: 156 U/L (ref 98–192)

## 2018-04-03 LAB — MRSA PCR SCREENING: MRSA BY PCR: POSITIVE — AB

## 2018-04-03 LAB — MAGNESIUM: MAGNESIUM: 2.1 mg/dL (ref 1.7–2.4)

## 2018-04-03 LAB — AMMONIA: AMMONIA: 23 umol/L (ref 9–35)

## 2018-04-03 MED ORDER — ALTEPLASE 2 MG IJ SOLR
2.0000 mg | Freq: Once | INTRAMUSCULAR | Status: AC
  Administered 2018-04-03: 2 mg
  Filled 2018-04-03: qty 2

## 2018-04-03 MED ORDER — ACETAMINOPHEN 500 MG PO TABS: 1000.0000 mg | ORAL_TABLET | Freq: Four times a day (QID) | ORAL | Status: DC | PRN

## 2018-04-03 MED ORDER — ADULT MULTIVITAMIN W/MINERALS CH
1.0000 | ORAL_TABLET | Freq: Every day | ORAL | Status: DC
  Administered 2018-04-03 – 2018-04-08 (×6): 1 via ORAL
  Filled 2018-04-03 (×6): qty 1

## 2018-04-03 MED ORDER — INSULIN GLARGINE 100 UNIT/ML ~~LOC~~ SOLN
10.0000 [IU] | Freq: Every day | SUBCUTANEOUS | Status: DC
  Administered 2018-04-03 – 2018-04-07 (×6): 10 [IU] via SUBCUTANEOUS
  Filled 2018-04-03 (×6): qty 0.1

## 2018-04-03 MED ORDER — SUCRALFATE 1 GM/10ML PO SUSP
1.0000 g | Freq: Three times a day (TID) | ORAL | Status: DC
  Administered 2018-04-03 – 2018-04-08 (×21): 1 g via ORAL
  Filled 2018-04-03 (×21): qty 10

## 2018-04-03 MED ORDER — MUPIROCIN 2 % EX OINT
1.0000 "application " | TOPICAL_OINTMENT | Freq: Two times a day (BID) | CUTANEOUS | Status: AC
  Administered 2018-04-03 – 2018-04-07 (×9): 1 via NASAL
  Filled 2018-04-03: qty 22

## 2018-04-03 MED ORDER — DOBUTAMINE IN D5W 4-5 MG/ML-% IV SOLN
INTRAVENOUS | Status: AC
  Filled 2018-04-03: qty 250

## 2018-04-03 MED ORDER — DOBUTAMINE IN D5W 4-5 MG/ML-% IV SOLN
7.5000 ug/kg/min | INTRAVENOUS | Status: DC
  Administered 2018-04-03 – 2018-04-04 (×2): 7.5 ug/kg/min via INTRAVENOUS
  Filled 2018-04-03: qty 250

## 2018-04-03 MED ORDER — MAGNESIUM OXIDE 400 (241.3 MG) MG PO TABS
400.0000 mg | ORAL_TABLET | Freq: Every day | ORAL | Status: DC
  Administered 2018-04-03 – 2018-04-08 (×6): 400 mg via ORAL
  Filled 2018-04-03 (×6): qty 1

## 2018-04-03 MED ORDER — ONDANSETRON HCL 4 MG/2ML IJ SOLN: 4.0000 mg | Freq: Four times a day (QID) | INTRAMUSCULAR | Status: DC | PRN

## 2018-04-03 MED ORDER — AMIODARONE HCL 200 MG PO TABS
200.0000 mg | ORAL_TABLET | Freq: Every day | ORAL | Status: DC
  Administered 2018-04-03 – 2018-04-08 (×6): 200 mg via ORAL
  Filled 2018-04-03 (×6): qty 1

## 2018-04-03 MED ORDER — INSULIN ASPART 100 UNIT/ML ~~LOC~~ SOLN
5.0000 [IU] | Freq: Three times a day (TID) | SUBCUTANEOUS | Status: DC
  Administered 2018-04-03 – 2018-04-08 (×16): 5 [IU] via SUBCUTANEOUS

## 2018-04-03 MED ORDER — FUROSEMIDE 10 MG/ML IJ SOLN
80.0000 mg | Freq: Two times a day (BID) | INTRAMUSCULAR | Status: DC
  Administered 2018-04-03 – 2018-04-05 (×5): 80 mg via INTRAVENOUS
  Filled 2018-04-03 (×5): qty 8

## 2018-04-03 MED ORDER — LIDOCAINE HCL (PF) 2 % IJ SOLN
INTRAMUSCULAR | Status: AC
  Filled 2018-04-03: qty 20

## 2018-04-03 MED ORDER — SODIUM CHLORIDE 0.9% IV SOLUTION
Freq: Once | INTRAVENOUS | Status: AC
  Administered 2018-04-03: 14:00:00 via INTRAVENOUS

## 2018-04-03 MED ORDER — ACETAMINOPHEN 325 MG PO TABS
650.0000 mg | ORAL_TABLET | ORAL | Status: DC | PRN
  Administered 2018-04-07: 650 mg via ORAL
  Filled 2018-04-03: qty 2

## 2018-04-03 MED ORDER — LIDOCAINE HCL (PF) 2 % IJ SOLN
INTRAMUSCULAR | Status: DC | PRN
  Administered 2018-04-03 – 2018-04-07 (×2): 10 mL

## 2018-04-03 MED ORDER — LACTULOSE 10 GM/15ML PO SOLN
45.0000 g | Freq: Three times a day (TID) | ORAL | Status: DC
  Administered 2018-04-03 – 2018-04-08 (×16): 45 g via ORAL
  Filled 2018-04-03 (×17): qty 90

## 2018-04-03 MED ORDER — GABAPENTIN 300 MG PO CAPS
300.0000 mg | ORAL_CAPSULE | Freq: Two times a day (BID) | ORAL | Status: DC
  Administered 2018-04-03 – 2018-04-08 (×11): 300 mg via ORAL
  Filled 2018-04-03 (×11): qty 1

## 2018-04-03 MED ORDER — SODIUM CHLORIDE 0.9% FLUSH
10.0000 mL | INTRAVENOUS | Status: DC | PRN
  Administered 2018-04-05: 10 mL
  Filled 2018-04-03: qty 40

## 2018-04-03 MED ORDER — WARFARIN - PHARMACIST DOSING INPATIENT
Freq: Every day | Status: DC
  Administered 2018-04-04 – 2018-04-06 (×3)

## 2018-04-03 MED ORDER — DOCUSATE SODIUM 100 MG PO CAPS: 200.0000 mg | ORAL_CAPSULE | Freq: Every evening | ORAL | Status: DC | PRN

## 2018-04-03 MED ORDER — WARFARIN SODIUM 7.5 MG PO TABS
7.5000 mg | ORAL_TABLET | Freq: Once | ORAL | Status: AC
  Administered 2018-04-03: 7.5 mg via ORAL
  Filled 2018-04-03: qty 1

## 2018-04-03 MED ORDER — TRAZODONE HCL 50 MG PO TABS
50.0000 mg | ORAL_TABLET | Freq: Every evening | ORAL | Status: DC | PRN
  Administered 2018-04-03 – 2018-04-07 (×4): 50 mg via ORAL
  Filled 2018-04-03 (×4): qty 1

## 2018-04-03 MED ORDER — DIGOXIN 125 MCG PO TABS
0.1250 mg | ORAL_TABLET | Freq: Every day | ORAL | Status: DC
  Administered 2018-04-03: 0.125 mg via ORAL
  Filled 2018-04-03: qty 1

## 2018-04-03 MED ORDER — MIDODRINE HCL 5 MG PO TABS
10.0000 mg | ORAL_TABLET | Freq: Three times a day (TID) | ORAL | Status: DC
Start: 2018-04-03 — End: 2018-04-08
  Administered 2018-04-03 – 2018-04-08 (×16): 10 mg via ORAL
  Filled 2018-04-03 (×16): qty 2

## 2018-04-03 MED ORDER — PANTOPRAZOLE SODIUM 40 MG PO TBEC
40.0000 mg | DELAYED_RELEASE_TABLET | Freq: Every day | ORAL | Status: DC
  Administered 2018-04-03 – 2018-04-08 (×6): 40 mg via ORAL
  Filled 2018-04-03 (×6): qty 1

## 2018-04-03 MED ORDER — CHLORHEXIDINE GLUCONATE CLOTH 2 % EX PADS
6.0000 | MEDICATED_PAD | Freq: Every day | CUTANEOUS | Status: AC
  Administered 2018-04-03 – 2018-04-04 (×2): 6 via TOPICAL

## 2018-04-03 MED ORDER — CYCLOBENZAPRINE HCL 10 MG PO TABS: 10.0000 mg | ORAL_TABLET | Freq: Three times a day (TID) | ORAL | Status: DC | PRN

## 2018-04-03 MED ORDER — CITALOPRAM HYDROBROMIDE 20 MG PO TABS
20.0000 mg | ORAL_TABLET | Freq: Every day | ORAL | Status: DC
  Administered 2018-04-03 – 2018-04-08 (×6): 20 mg via ORAL
  Filled 2018-04-03 (×7): qty 1

## 2018-04-03 MED ORDER — SENNOSIDES-DOCUSATE SODIUM 8.6-50 MG PO TABS: 2.0000 | ORAL_TABLET | Freq: Every evening | ORAL | Status: DC | PRN

## 2018-04-03 MED ORDER — LEVOTHYROXINE SODIUM 50 MCG PO TABS
50.0000 ug | ORAL_TABLET | Freq: Every day | ORAL | Status: DC
  Administered 2018-04-03 – 2018-04-08 (×6): 50 ug via ORAL
  Filled 2018-04-03 (×6): qty 1

## 2018-04-03 NOTE — Progress Notes (Signed)
ANTICOAGULATION CONSULT NOTE - Initial Consult  Pharmacy Consult for coumadin Indication: LVAD  Allergies  Allergen Reactions  . Chlorhexidine Gluconate Itching and Rash  . Codeine Nausea And Vomiting and Other (See Comments)    In high doses  . Lipitor [Atorvastatin] Nausea Only and Other (See Comments)    Nausea with high doses, tolerates 20mg  dose (08/22/16)  . Tape Other (See Comments)    Paper tape is ok    Patient Measurements: Height: 5\' 9"  (175.3 cm) Weight: 215 lb (97.5 kg) IBW/kg (Calculated) : 70.7   Vital Signs: Temp: 97.5 F (36.4 C) (06/30 2351) Temp Source: Axillary (06/30 2351) Pulse Rate: 80 (06/30 1818)  Labs: Recent Labs    04/02/18 1849  HGB 7.5*  HCT 27.3*  PLT 189  LABPROT 25.1*  INR 2.30  CREATININE 2.27*    Estimated Creatinine Clearance: 39.3 mL/min (A) (by C-G formula based on SCr of 2.27 mg/dL (H)).   Medical History: Past Medical History:  Diagnosis Date  . AICD (automatic cardioverter/defibrillator) present   . Anxiety   . ASCVD (arteriosclerotic cardiovascular disease)   . Chronic systolic CHF (congestive heart failure) ([place])   . COPD (chronic obstructive pulmonary disease) ([place])   . Coronary artery disease   . Depression   . GI bleed   . High cholesterol   . History of blood transfusion   . History of cocaine abuse   . Hypertension   . LVAD (left ventricular assist device) present ([place])   . Presence of permanent cardiac pacemaker   . Shortness of breath dyspnea   . Suicidal ideation   . Tobacco abuse   . Type II diabetes mellitus (HCC)     Medications:  Medications Prior to Admission  Medication Sig Dispense Refill Last Dose  . acetaminophen (TYLENOL) 500 MG tablet Take 1,000 mg by mouth every 6 (six) hours as needed for moderate pain.    unknown  . amiodarone (PACERONE) 200 MG tablet TAKE ONE TABLET BY MOUTH ONCE DAILY 60 tablet 6 04/02/2018 at 800  . Cholecalciferol (VITAMIN D3) 3000 units TABS Take 3,000 Units by  mouth daily.   04/02/2018 at 800  . citalopram (CELEXA) 20 MG tablet Take 1 tablet (20 mg total) by mouth daily. 30 tablet 6 04/02/2018 at 800  . cyclobenzaprine (FLEXERIL) 10 MG tablet Take 1 tablet (10 mg total) by mouth 3 (three) times daily as needed for muscle spasms. (Patient taking differently: Take 10 mg by mouth every 8 (eight) hours as needed for muscle spasms. ) 30 tablet 1 03/18/2018 at 2158  . digoxin (DIGOX) 0.125 MG tablet Take 1 tablet (0.125 mg total) daily by mouth. 30 tablet 6 04/02/2018 at 800  . DOBUTamine (DOBUTREX) 4-5 MG/ML-% infusion Inject 714.75 mcg/min into the vein continuous. PER [facility] pharmacy 250 mL 11 04/02/2018 at 1430  . docusate sodium (COLACE) 100 MG capsule Take 100 mg by mouth at bedtime as needed (constipation).    unknown  . gabapentin (NEURONTIN) 300 MG capsule Take 1 capsule (300 mg total) by mouth 2 (two) times daily. 180 capsule 6 04/02/2018 at 800  . insulin aspart (NOVOLOG FLEXPEN) 100 UNIT/ML FlexPen Inject 5 Units into the skin 3 (three) times daily with meals. 15 mL 11 04/02/2018 at 1200  . Insulin Detemir (LEVEMIR FLEXTOUCH) 100 UNIT/ML Pen Inject 10 Units into the skin at bedtime.   04/01/2018 at 2200  . lactulose (CHRONULAC) 10 GM/15ML solution Take 67.5 mLs (45 g total) by mouth 3 (three) times daily.  240 mL 6 04/02/2018 at 1300  . levothyroxine (SYNTHROID, LEVOTHROID) 50 MCG tablet Take 1 tablet (50 mcg total) by mouth daily before breakfast. 30 tablet 6 04/02/2018 at 630  . magnesium oxide (MAG-OX) 400 (241.3 Mg) MG tablet Take 1 tablet (400 mg total) by mouth daily. 30 tablet 6 04/02/2018 at 800  . metolazone (ZAROXOLYN) 2.5 MG tablet Take 2.5 mg by mouth daily.    04/02/2018 at 900  . midodrine (PROAMATINE) 10 MG tablet Take 1 tablet (10 mg total) by mouth 3 (three) times daily with meals. 90 tablet 6 04/02/2018 at 1200  . Multiple Vitamin (MULTIVITAMIN WITH MINERALS) TABS tablet Take 1 tablet by mouth daily. Thera-M   04/02/2018 at 800  . ondansetron (ZOFRAN)  4 MG tablet Take 4 mg by mouth every 8 (eight) hours as needed for nausea or vomiting.   04/02/2018 at 649  . OXYGEN Inhale 2 L into the lungs as needed (for SATS below 90%).   unknown  . pantoprazole (PROTONIX) 40 MG tablet TAKE 1 TABLET BY MOUTH TWICE DAILY WITH A MEAL 60 tablet 6 04/02/2018 at 800  . potassium chloride SA (K-DUR,KLOR-CON) 20 MEQ tablet Take 4 tablets (80 mEq total) by mouth 2 (two) times daily. Take 40 meq in am and 20 meq in pm (Patient taking differently: Take 80 mEq by mouth 2 (two) times daily. ) 120 tablet 3 04/02/2018 at 900  . senna-docusate (SENOKOT S) 8.6-50 MG tablet Take 2 tablets by mouth at bedtime as needed for mild constipation. 30 tablet 6 unknown  . Skin Protectants, Misc. (MINERIN) CREA Apply 1 application topically See admin instructions. Apply to legs daily for dry skin   04/02/2018 at 1430  . spironolactone (ALDACTONE) 25 MG tablet Take 1 tablet (25 mg total) by mouth daily. 30 tablet 6 04/02/2018 at 800  . sucralfate (CARAFATE) 1 GM/10ML suspension Take 10 mLs (1 g total) by mouth 4 (four) times daily -  with meals and at bedtime. 420 mL 6 04/02/2018 at 1200  . torsemide (DEMADEX) 100 MG tablet Take 1 tablet (100 mg total) by mouth 2 (two) times daily. Take 100mg  in the morning and 50mg  in the evening (Patient taking differently: Take 100 mg by mouth 2 (two) times daily. 8am and 4pm)   04/02/2018 at 800  . warfarin (COUMADIN) 5 MG tablet Take 5mg  (1tab) Monday, Wednesday and Friday, take 7.5mg  (1.5tab) Tuesday, Thursday, Saturday and Sunday (Patient taking differently: Take 5 mg by mouth See admin instructions. New order 03/15/18: take 1 tablet (5 mg) by mouth Monday, Wednesday, Friday at 5pm) 60 tablet 3 03/31/2018 at 1700  . warfarin (COUMADIN) 7.5 MG tablet Take 7.5 mg by mouth See admin instructions. Take 1 tablet (7.5 mg) by mouth every Tuesday, Thursday, Saturday and Sunday at 5pm   04/01/2018 at 1700  . Insulin Glargine (LANTUS SOLOSTAR) 100 UNIT/ML Solostar Pen  Inject 10 Units into the skin daily at 10 pm. (Patient not taking: Reported on 04/02/2018) 1 pen 6 Not Taking at Unknown time  . ondansetron (ZOFRAN ODT) 4 MG disintegrating tablet Take 1 tablet (4 mg total) by mouth every 8 (eight) hours as needed for nausea. (Patient not taking: Reported on 04/02/2018) 6 tablet 0 Not Taking at Unknown time  . traZODone (DESYREL) 50 MG tablet Take 1 tablet (50 mg total) by mouth at bedtime as needed for sleep. (Patient not taking: Reported on 04/02/2018) 30 tablet 6 Not Taking at Unknown time    Assessment: 62 yo man  to continue coumadin for LVAD.  INR 2.3 Last dose 6/29 Goal of Therapy:  INR 1.8-2.4 Monitor platelets by anticoagulation protocol: Yes   Plan:  Will not give coumadin dose this am. Check daily PT/INR Monitor for bleeding complications  Terrie Grajales [place]et 04/03/2018,12:20 AM

## 2018-04-03 NOTE — Progress Notes (Addendum)
Advanced Heart Failure VAD Team Note  PCP-Cardiologist: Glori Bickers, MD   Subjective:    Sluggish UOP with -330 mls. Weight down 4 lbs overnight. Received 80 mg IV lasix x1 yesterday. Creatinine 2.27 > 2.10. Na 126, K 4.6  Hemoglobin 7.5 > 7.1, INR 2.09. Denies bleeding.  Remains on dobutamine drip @ 7.5 mcg/kg/min. CVP 16  Went to IR today for paracentesis and had 3 L off.   Drowsy, but oriented. Denies CP, SOB, orthopnea, PND or bleeding. Asking about fluid restriction.   LVAD INTERROGATION:  HeartMate 3 LVAD:   Flow 5.6 liters/min, speed 5800, power 5, PI 3.5.  Personally reviewed.   Objective:    Vital Signs:   Temp:  [97.5 F (36.4 C)-98 F (36.7 C)] 97.6 F (36.4 C) (07/01 0804) Pulse Rate:  [80] 80 (06/30 1818) Resp:  [19-22] 19 (07/01 0400) BP: (89)/(72) 89/72 (07/01 0100) SpO2:  [93 %-95 %] 95 % (07/01 0100) Weight:  [211 lb 13.8 oz (96.1 kg)-215 lb (97.5 kg)] 211 lb 13.8 oz (96.1 kg) (07/01 0100) Last BM Date: 04/03/18 Mean arterial Pressure 80s  Intake/Output:   Intake/Output Summary (Last 24 hours) at 04/03/2018 0919 Last data filed at 04/03/2018 0400 Gross per 24 hour  Intake 271.84 ml  Output 600 ml  Net -328.16 ml     Physical Exam    General:  No resp difficulty HEENT: normal anicteric Neck: supple. JVP 16. Carotids 2+ bilat; no bruits. No lymphadenopathy or thyromegaly appreciated. Cor: Mechanical heart sounds with LVAD hum present. Lungs: clear decreased at bases  Abdomen: obese soft, nontender, + distended. No hepatosplenomegaly. No bruits or masses. Good bowel sounds. Driveline: C/D/I; securement device intact and driveline incorporated Extremities: no cyanosis, clubbing, rash, trace edema Neuro: alert & oriented x 3, cranial nerves grossly intact. moves all 4 extremities w/o difficulty. Affect pleasant    Telemetry   AV paced 70-80s with wide QRS. Personally reviewed.   Labs   Basic Metabolic Panel: Recent Labs  Lab  03/28/18 0908 04/02/18 1849 04/03/18 0606  NA 130* 125* 126*  K 3.3* 6.5* 4.6  CL 87* 87* 86*  CO2 32 25 30  GLUCOSE 138* 247* 182*  BUN 45* 43* 43*  CREATININE 1.83* 2.27* 2.10*  CALCIUM 9.0 9.0 8.7*  MG  --  2.2  --     Liver Function Tests: Recent Labs  Lab 04/02/18 1849  AST 31  ALT 18  ALKPHOS 100  BILITOT 1.1  PROT 7.3  ALBUMIN 3.4*   No results for input(s): LIPASE, AMYLASE in the last 168 hours. Recent Labs  Lab 03/28/18 0908 04/02/18 1849 04/03/18 0606  AMMONIA 52* 59* 23    CBC: Recent Labs  Lab 03/28/18 0908 04/02/18 1849 04/03/18 0606  WBC 6.3 9.9 6.3  NEUTROABS  --  8.2*  --   HGB 7.9* 7.5* 7.1*  HCT 28.1* 27.3* 25.6*  MCV 77.2* 78.9 77.3*  PLT 184 189 162    INR: Recent Labs  Lab 03/28/18 0908 04/02/18 1849 04/03/18 0606  INR 1.41 2.30 2.09    Other results:  EKG:    Imaging   Dg Chest Portable 1 View  Result Date: 04/02/2018 CLINICAL DATA:  Shortness of breath. EXAM: PORTABLE CHEST 1 VIEW COMPARISON:  Chest x-ray dated Feb 23, 2018. FINDINGS: Unchanged left chest wall AICD. Unchanged tunneled right internal jugular central venous catheter. Unchanged LVAD device. Stable cardiomegaly status post CABG. Normal pulmonary vascularity. 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 No consolidation,  large pleural effusion, or pneumothorax. No acute osseous abnormality. IMPRESSION: No active disease. Electronically Signed   By: Titus Dubin M.D.   On: 04/02/2018 19:42      Medications:     Scheduled Medications: . amiodarone  200 mg Oral Daily  . Chlorhexidine Gluconate Cloth  6 each Topical Q0600  . citalopram  20 mg Oral Daily  . digoxin  0.125 mg Oral Daily  . furosemide  80 mg Intravenous BID  . gabapentin  300 mg Oral BID  . insulin aspart  5 Units Subcutaneous TID WC  . insulin glargine  10 Units Subcutaneous Q2200  . lactulose  45 g Oral TID  . levothyroxine  50 mcg Oral QAC breakfast  . lidocaine       . magnesium oxide  400 mg Oral Daily  . midodrine  10 mg Oral TID WC  . multivitamin with minerals  1 tablet Oral Daily  . mupirocin ointment  1 application Nasal BID  . pantoprazole  40 mg Oral Daily  . sucralfate  1 g Oral TID WC & HS     Infusions: . DOBUTamine 7.5 mcg/kg/min (04/03/18 0400)     PRN Medications:  acetaminophen, acetaminophen, cyclobenzaprine, docusate sodium, ondansetron (ZOFRAN) IV, ondansetron (ZOFRAN) IV, senna-docusate, sodium chloride flush, traZODone   Patient Profile   ZACHERIAH STUMPE Jr.is a 62 y.o.male with history of chronic systolic HF s/p Med[employer]onic ICD 2014, CAD s/p CABG in 2000, HTN, Hx of cocaine abuse, Tobacco abuse, Depression, PAF, and COPD. HM3 placed 09/09/2016. In April 2018 he was placed on milrinone but later switched to dobutamine. He remains on dobutamine 7.51mcg.   Admitted with volume overload and AKI.   Assessment/Plan:    1. Acute on Chronic end-stage biventricular systolic HF with R>>L HF: LVEF 15% due to ICM -> s/p Echo 08/24/16 LVEF 15%, RV mild dilated, moderately reduced. -->S/P HM3 LVAD 09/09/2016.  Has been turned down for transplant at [facility].  - Echo 2/18 EF 15. Severe RV dysfunction - Complicated by RV failure. Failed milrinone due to persistent RV failure/shock..   - Chronic NYHA IIIB-IV despite VAD support. Continue dobutamine 7.5 mcg and midodrine. CVP 16.  - On exam volume status does not seem markedly elevated from his baseline though he is up 6 pounds and abdomen is more distended but not tight. Paracentesis attempted in ER but was unsuccessful  - [place] UOP with 80 mg IV lasix x1, but weight is down 4 lbs.  - Continue midodrine 10 TID for BP support in setting of cirrhotic physiology - Off sildenafil due to hypotension - No beta blocker with RV failure   2. Acute on chronic renal failure (CKD3 at baseline) - Baseline creatinine 1.5-1.6 - Improved 2.27 > 2.10 this morning.  - Hopefully will improved with IV  diuresis. Follow BMETs closely  3. Hyperkalemia - Kayexalate and IV lasix given in ER. Potassium and spiro held - K 4.6 this am. Continue to follow closely.   4. Hyponatremia - Will follow. Suspect he may need tolvaptan in am - Free water restrict. Na 126 this am.   5. Heartmate-3 LVAD - VAD interrogated personally. Parameters stable. - INR today 2.09. Warfarin per [facility] - LDH stable [place].  - Not transplant candidate due to social issues, noncompliance and cirrhosis (has been turned down by [facility])  6. Cirrhosis with ascites and hepatic encephalopathy - IR paracentesis today with 3 L off - Encephalopathy controlled with lactulose. Ammonia normal  7.  CAD: Severe 3v- CAD s/p CABG with occluded grafts as above except for LIMA.  - No s/s ischemia.  - Continue current therapy.   8. DMII:  - Cover with SSI. No change.  .  9. Atrial fibrillation, paroxysmal - Continue amio 200 daily. Careful with cirrhosis   10.  Anxiety/Depression - Continue Celexa 20mg  daily. No change.  - Social issues still a concern, though now long-term SNF.   11. Anemia, iron-deficient:   - Has had GI work up with EGD and Capsule Endoscopy showing severe gastropath  - Recently received 2UPRBCS as outpatient. Hgb 7.5 > 7.1. May need transfusion today.  - Transfuse 2 units today.   12. DNR/DNI  I reviewed the LVAD parameters from today, and compared the results to the patient's prior recorded data.  No programming changes were made.  The LVAD is functioning within specified parameters.  The patient performs LVAD self-test daily.  LVAD interrogation was negative for any significant power changes, alarms or PI events/speed drops.  LVAD equipment check completed and is in good working order.  Back-up equipment present.   LVAD education done on emergency procedures and precautions and reviewed exit site care.  Length of Stay: [place], NP 04/03/2018, 9:19 AM  VAD Team --- VAD  ISSUES ONLY--- Pager [phone] (7am - 7am)  Advanced Heart Failure Team  Pager [phone] (M-F; 7a - 4p)  Please contact [facility] for night-coverage after hours (4p -7a ) and weekends on [email or web address]  Patient seen and examined with the above-signed Advanced Practice Provider and/or Housestaff. I personally reviewed laboratory data, imaging studies and relevant notes. I independently examined the patient and formulated the important aspects of the plan. I have edited the note to reflect any of my changes or salient points. I have personally discussed the plan with the patient and/or family.  Feeling better today. Nausea resolved. Remains on dobutamine for RV support. But co-ox quite low at 44% CVP 16/ Had 3L off with paracentesis. Urine output sluggish in setting of low output. No low flow alarms on VAD. Will need to continue to follow co-ox. If not rebounding he may be getting closer to end-stage. Sodium a bit better. Potassium down. No asterixis. INR 2.1. Discussed dosing with PharmD personally.  Glori Bickers, MD  7:14 PM

## 2018-04-03 NOTE — Progress Notes (Signed)
ANTICOAGULATION CONSULT NOTE - Initial Consult  Pharmacy Consult for coumadin Indication: LVAD  Allergies  Allergen Reactions  . Chlorhexidine Gluconate Itching and Rash  . Codeine Nausea And Vomiting and Other (See Comments)    In high doses  . Lipitor [Atorvastatin] Nausea Only and Other (See Comments)    Nausea with high doses, tolerates 20mg  dose (08/22/16)  . Tape Other (See Comments)    Paper tape is ok    Patient Measurements: Height: 5\' 9"  (175.3 cm) Weight: 211 lb 13.8 oz (96.1 kg) IBW/kg (Calculated) : 70.7   Vital Signs: Temp: 97.6 F (36.4 C) (07/01 0804) Temp Source: Oral (07/01 0804) BP: 89/72 (07/01 0100)  Labs: Recent Labs    04/02/18 1849 04/03/18 0606  HGB 7.5* 7.1*  HCT 27.3* 25.6*  PLT 189 162  LABPROT 25.1* 23.3*  INR 2.30 2.09  CREATININE 2.27* 2.10*    Estimated Creatinine Clearance: 42.3 mL/min (A) (by C-G formula based on SCr of 2.1 mg/dL (H)).   Medical History: Past Medical History:  Diagnosis Date  . AICD (automatic cardioverter/defibrillator) present   . Anxiety   . ASCVD (arteriosclerotic cardiovascular disease)   . Chronic systolic CHF (congestive heart failure) ([place])   . COPD (chronic obstructive pulmonary disease) ([place])   . Coronary artery disease   . Depression   . GI bleed   . High cholesterol   . History of blood transfusion   . History of cocaine abuse   . Hypertension   . LVAD (left ventricular assist device) present ([place])   . Presence of permanent cardiac pacemaker   . Shortness of breath dyspnea   . Suicidal ideation   . Tobacco abuse   . Type II diabetes mellitus (HCC)     Medications:  Medications Prior to Admission  Medication Sig Dispense Refill Last Dose  . acetaminophen (TYLENOL) 500 MG tablet Take 1,000 mg by mouth every 6 (six) hours as needed for moderate pain.    unknown  . amiodarone (PACERONE) 200 MG tablet TAKE ONE TABLET BY MOUTH ONCE DAILY 60 tablet 6 04/02/2018 at 800  . Cholecalciferol  (VITAMIN D3) 3000 units TABS Take 3,000 Units by mouth daily.   04/02/2018 at 800  . citalopram (CELEXA) 20 MG tablet Take 1 tablet (20 mg total) by mouth daily. 30 tablet 6 04/02/2018 at 800  . cyclobenzaprine (FLEXERIL) 10 MG tablet Take 1 tablet (10 mg total) by mouth 3 (three) times daily as needed for muscle spasms. (Patient taking differently: Take 10 mg by mouth every 8 (eight) hours as needed for muscle spasms. ) 30 tablet 1 03/18/2018 at 2158  . digoxin (DIGOX) 0.125 MG tablet Take 1 tablet (0.125 mg total) daily by mouth. 30 tablet 6 04/02/2018 at 800  . DOBUTamine (DOBUTREX) 4-5 MG/ML-% infusion Inject 714.75 mcg/min into the vein continuous. PER [facility] pharmacy 250 mL 11 04/02/2018 at 1430  . docusate sodium (COLACE) 100 MG capsule Take 100 mg by mouth at bedtime as needed (constipation).    unknown  . gabapentin (NEURONTIN) 300 MG capsule Take 1 capsule (300 mg total) by mouth 2 (two) times daily. 180 capsule 6 04/02/2018 at 800  . insulin aspart (NOVOLOG FLEXPEN) 100 UNIT/ML FlexPen Inject 5 Units into the skin 3 (three) times daily with meals. 15 mL 11 04/02/2018 at 1200  . Insulin Detemir (LEVEMIR FLEXTOUCH) 100 UNIT/ML Pen Inject 10 Units into the skin at bedtime.   04/01/2018 at 2200  . lactulose (CHRONULAC) 10 GM/15ML solution Take 67.5 mLs (  45 g total) by mouth 3 (three) times daily. 240 mL 6 04/02/2018 at 1300  . levothyroxine (SYNTHROID, LEVOTHROID) 50 MCG tablet Take 1 tablet (50 mcg total) by mouth daily before breakfast. 30 tablet 6 04/02/2018 at 630  . magnesium oxide (MAG-OX) 400 (241.3 Mg) MG tablet Take 1 tablet (400 mg total) by mouth daily. 30 tablet 6 04/02/2018 at 800  . metolazone (ZAROXOLYN) 2.5 MG tablet Take 2.5 mg by mouth daily.    04/02/2018 at 900  . midodrine (PROAMATINE) 10 MG tablet Take 1 tablet (10 mg total) by mouth 3 (three) times daily with meals. 90 tablet 6 04/02/2018 at 1200  . Multiple Vitamin (MULTIVITAMIN WITH MINERALS) TABS tablet Take 1 tablet by mouth daily.  Thera-M   04/02/2018 at 800  . ondansetron (ZOFRAN) 4 MG tablet Take 4 mg by mouth every 8 (eight) hours as needed for nausea or vomiting.   04/02/2018 at 649  . OXYGEN Inhale 2 L into the lungs as needed (for SATS below 90%).   unknown  . pantoprazole (PROTONIX) 40 MG tablet TAKE 1 TABLET BY MOUTH TWICE DAILY WITH A MEAL 60 tablet 6 04/02/2018 at 800  . potassium chloride SA (K-DUR,KLOR-CON) 20 MEQ tablet Take 4 tablets (80 mEq total) by mouth 2 (two) times daily. Take 40 meq in am and 20 meq in pm (Patient taking differently: Take 80 mEq by mouth 2 (two) times daily. ) 120 tablet 3 04/02/2018 at 900  . senna-docusate (SENOKOT S) 8.6-50 MG tablet Take 2 tablets by mouth at bedtime as needed for mild constipation. 30 tablet 6 unknown  . Skin Protectants, Misc. (MINERIN) CREA Apply 1 application topically See admin instructions. Apply to legs daily for dry skin   04/02/2018 at 1430  . spironolactone (ALDACTONE) 25 MG tablet Take 1 tablet (25 mg total) by mouth daily. 30 tablet 6 04/02/2018 at 800  . sucralfate (CARAFATE) 1 GM/10ML suspension Take 10 mLs (1 g total) by mouth 4 (four) times daily -  with meals and at bedtime. 420 mL 6 04/02/2018 at 1200  . torsemide (DEMADEX) 100 MG tablet Take 1 tablet (100 mg total) by mouth 2 (two) times daily. Take 100mg  in the morning and 50mg  in the evening (Patient taking differently: Take 100 mg by mouth 2 (two) times daily. 8am and 4pm)   04/02/2018 at 800  . warfarin (COUMADIN) 5 MG tablet Take 5mg  (1tab) Monday, Wednesday and Friday, take 7.5mg  (1.5tab) Tuesday, Thursday, Saturday and Sunday (Patient taking differently: Take 5 mg by mouth See admin instructions. New order 03/15/18: take 1 tablet (5 mg) by mouth Monday, Wednesday, Friday at 5pm) 60 tablet 3 03/31/2018 at 1700  . warfarin (COUMADIN) 7.5 MG tablet Take 7.5 mg by mouth See admin instructions. Take 1 tablet (7.5 mg) by mouth every Tuesday, Thursday, Saturday and Sunday at 5pm   04/01/2018 at 1700  . Insulin  Glargine (LANTUS SOLOSTAR) 100 UNIT/ML Solostar Pen Inject 10 Units into the skin daily at 10 pm. (Patient not taking: Reported on 04/02/2018) 1 pen 6 Not Taking at Unknown time  . ondansetron (ZOFRAN ODT) 4 MG disintegrating tablet Take 1 tablet (4 mg total) by mouth every 8 (eight) hours as needed for nausea. (Patient not taking: Reported on 04/02/2018) 6 tablet 0 Not Taking at Unknown time  . traZODone (DESYREL) 50 MG tablet Take 1 tablet (50 mg total) by mouth at bedtime as needed for sleep. (Patient not taking: Reported on 04/02/2018) 30 tablet 6 Not Taking at  Unknown time    Assessment: 62 yo man to continue coumadin for LVAD.  Warfarin regimen as of 6/25 at outpatient anticoag visit was 7.5 mg daily except 5 mg on Mon, Wed, Friday.   INR is 2.3 on admission. Last dose of warfarin was 6/28 as per SNF MAR? - missed dose on 6/30. INR today is 2.09. Hgb is 7.1, plt 162, LDH stable. No s/sx of bleeding. On home amiodarone dose.   Goal of Therapy:  INR 1.8-2.4 Monitor platelets by anticoagulation protocol: Yes   Plan:  Order warfarin 7.5 mg today since missed dose on 6/30. Check daily PT/INR Monitor for bleeding complications  Doylene Canard, PharmD Clinical Pharmacist  Pager: [phone] Phone: [phone] 04/03/2018,11:58 AM

## 2018-04-03 NOTE — Progress Notes (Signed)
Patient has Tunneled Central Line single lumen in right upper chest. Patient admitted from [place]. IV team notified of patient admission and IV person stated they no longer had to come and assess the lines. Dressing reads 03/31/2018 as the last dressing change. Patient stated it was last Friday when they changed the Dobutamine bag when the Central Line  dressing was changed. Will continue to monitor closely.

## 2018-04-03 NOTE — Progress Notes (Signed)
[facility]  Active pt with [facility] team on Milrinone as a resident at [facility].  [facility] is providing his Milrinone while there.  [facility] Infusion Coordinator will follow pt with HF team to support transition back to [facility] as ordered.   If patient discharges after hours, please call [phone].   [name] 04/03/2018, 8:55 AM

## 2018-04-03 NOTE — Progress Notes (Signed)
LVAD Coordinator Rounding Note:  Admitted 04/02/18 to Dr. Haroldine Laws due to ascites and CHF with fluid overload.   HM III LVAD implanted on 09/09/16 by Dr. Cyndia Bent under Destination Therapy criteria.  Pt just returned for IR post paracentesis; removed 3 liters. Pt reports "I don't feel any different". Family at bedside assisting with breakfast.   Vital signs: Temp:  97.5 HR: 80 Doppler Pressure: 74 Automatic BP:  O2 Sat: 95 % on RA Wt: 215>211 lbs  LVAD interrogation reveals:  Speed: 5800 Flow: 5.4 Power: 4.5 w PI:  2.7 Alarms: none Events: 7 Pi events Fixed speed:  5800 Low speed limit: 5500  Drive Line: left abdominal sorbaview dressing dry and intact; anchor intact and accurately applied. Change weekly; due Tues April 04, 2018. Bedside nurse may change dressing.  Pt has an intolerance to CHG please use saline and betadine only.  Labs:  LDH trend: 186>156  INR trend: 2.30>2.09  Gtts: Dobutamine 7.5 mcg/kg/min  Anticoagulation Plan: -INR Goal:  1.8 - 2.3 -ASA Dose: no ASA (history of GI bleeding)  Device: -Protect; end of service 08/2016 -Therapies:  Off Last check: complete interrogation 05/26/2017. DDD (lower rate 60); BiV pacing with 96% AS-VP  Adverse Events on VAD: 11/30/16> Milrinone gtt at discharge 01/27/17>dobutamine at 5 mcg/kg/mingtt at discharge 03/08/17> RV failure 04/29/17> symptomatic anemia and melena. EGD revealed stomach ulcers. Carafate added; ASA stopped 05/31/17>Ongoing symptomatic anemia and melena; capsule study mild antral gastritis; poss tiny AVM right colon. Lower MAPS, Losartan cut back. 06/2017> admit with syncope, AMS, Dobutamine at 7.76mcg 11/09/17>Marked ascites, RV failure, and hepatic encephalopathy. Paracentesis on 2/7 and 2/12 with >11 liters fluid removed 12/27/17>Outpatient paracentesis with 2.8 liters removed 01/11/18>IP paracentesis with 4.8 liters removed 01/16/18>IP paracentesis w/2.4L removed 02/15/18>OP paracentesis w/ 5 L  removed 02/23/18>IP paracentesis w/ 5.6 L removed   Plan/Recommendations:  1. Will need weekly dressing changes; due 04/04/18. Bedside RN may change dressing w/above instructions. 2. Please call VAD pager with any VAD equipment or drive line issues.  Zada Girt RN, VAD Coordiantor 24/7 VAD pager: [phone]

## 2018-04-03 NOTE — Plan of Care (Signed)
Patient making small improvements since admission.  Almost 9L of fluid have been removed per paracentesis.  Improved respiratory status noted.  Patient rested x 3 hours this afternoon without interruption.  Dobutamine gtt continues at baseline rate; receiving two units of PRBCs today for Hgb 6.8.  CoOx in the 40s.  Floor mats placed by bedside for patient safety and bed alarms in use.  Will continue to monitor progress toward care goals.

## 2018-04-03 NOTE — Procedures (Signed)
PROCEDURE SUMMARY:  Successful image-guided paracentesis from the left lower abdomen.  Yielded 3 liters of clear gold fluid.  No immediate complications.  Patient tolerated well.   Specimen was not sent for labs.  Claris Pong Louk PA-C 04/03/2018 9:46 AM

## 2018-04-04 ENCOUNTER — Encounter (HOSPITAL_COMMUNITY): Payer: Self-pay

## 2018-04-04 DIAGNOSIS — E871 Hypo-osmolality and hyponatremia: Secondary | ICD-10-CM

## 2018-04-04 LAB — BASIC METABOLIC PANEL
Anion gap: 12 (ref 5–15)
Anion gap: 10 (ref 5–15)
BUN: 40 mg/dL — ABNORMAL HIGH (ref 8–23)
BUN: 35 mg/dL — AB (ref 8–23)
CALCIUM: 8.6 mg/dL — AB (ref 8.9–10.3)
CHLORIDE: 87 mmol/L — AB (ref 98–111)
CO2: 30 mmol/L (ref 22–32)
CO2: 31 mmol/L (ref 22–32)
CREATININE: 1.55 mg/dL — AB (ref 0.61–1.24)
CREATININE: 1.42 mg/dL — AB (ref 0.61–1.24)
Calcium: 8.7 mg/dL — ABNORMAL LOW (ref 8.9–10.3)
Chloride: 87 mmol/L — ABNORMAL LOW (ref 98–111)
GFR calc Af Amer: >60 mL/min (ref >60)
GFR calc non Af Amer: 47 mL/min — ABNORMAL LOW (ref >60)
GFR calc non Af Amer: 52 mL/min — ABNORMAL LOW (ref >60)
GFR, EST AFRICAN AMERICAN: 54 mL/min — AB (ref >60)
Glucose, Bld: 149 mg/dL — ABNORMAL HIGH (ref 70–99)
Glucose, Bld: 176 mg/dL — ABNORMAL HIGH (ref 70–99)
Potassium: 2.9 mmol/L — ABNORMAL LOW (ref 3.5–5.1)
Potassium: 3.3 mmol/L — ABNORMAL LOW (ref 3.5–5.1)
SODIUM: 129 mmol/L — AB (ref 135–145)
SODIUM: 128 mmol/L — AB (ref 135–145)

## 2018-04-04 LAB — TYPE AND SCREEN
ABO/RH(D): O POS
Antibody Screen: NEGATIVE
UNIT DIVISION: 0
Unit division: 0

## 2018-04-04 LAB — CBC
HCT: 30.2 % — ABNORMAL LOW (ref 39.0–52.0)
Hemoglobin: 8.9 g/dL — ABNORMAL LOW (ref 13.0–17.0)
MCH: 23.5 pg — ABNORMAL LOW (ref 26.0–34.0)
MCHC: 29.5 g/dL — AB (ref 30.0–36.0)
MCV: 79.9 fL (ref 78.0–100.0)
PLATELETS: 159 10*3/uL (ref 150–400)
RBC: 3.78 MIL/uL — ABNORMAL LOW (ref 4.22–5.81)
RDW: 19.4 % — AB (ref 11.5–15.5)
WBC: 5.6 10*3/uL (ref 4.0–10.5)

## 2018-04-04 LAB — LACTATE DEHYDROGENASE: LDH: 146 U/L (ref 98–192)

## 2018-04-04 LAB — BPAM RBC
BLOOD PRODUCT EXPIRATION DATE: 201908032359
Blood Product Expiration Date: 201908032359
ISSUE DATE / TIME: 201907011545
ISSUE DATE / TIME: 201907012031
UNIT TYPE AND RH: 5100
UNIT TYPE AND RH: 5100

## 2018-04-04 LAB — GLUCOSE, CAPILLARY
GLUCOSE-CAPILLARY: 136 mg/dL — AB (ref 70–99)
Glucose-Capillary: 228 mg/dL — ABNORMAL HIGH (ref 70–99)
Glucose-Capillary: 167 mg/dL — ABNORMAL HIGH (ref 70–99)

## 2018-04-04 LAB — COOXEMETRY PANEL
Carboxyhemoglobin: 2.1 % — ABNORMAL HIGH (ref 0.5–1.5)
Methemoglobin: 1.7 % — ABNORMAL HIGH (ref 0.0–1.5)
O2 SAT: 67.4 %
TOTAL HEMOGLOBIN: 8.9 g/dL — AB (ref 12.0–16.0)

## 2018-04-04 LAB — PROTIME-INR
INR: 2.09
PROTHROMBIN TIME: 23.3 s — AB (ref 11.4–15.2)

## 2018-04-04 MED ORDER — WARFARIN SODIUM 7.5 MG PO TABS
7.5000 mg | ORAL_TABLET | Freq: Once | ORAL | Status: AC
  Administered 2018-04-04: 7.5 mg via ORAL
  Filled 2018-04-04: qty 1

## 2018-04-04 MED ORDER — POTASSIUM CHLORIDE CRYS ER 20 MEQ PO TBCR
20.0000 meq | EXTENDED_RELEASE_TABLET | Freq: Two times a day (BID) | ORAL | Status: DC
  Filled 2018-04-04: qty 1

## 2018-04-04 MED ORDER — POTASSIUM CHLORIDE CRYS ER 20 MEQ PO TBCR
40.0000 meq | EXTENDED_RELEASE_TABLET | Freq: Once | ORAL | Status: AC
  Administered 2018-04-04: 40 meq via ORAL
  Filled 2018-04-04: qty 2

## 2018-04-04 MED ORDER — POTASSIUM CHLORIDE CRYS ER 10 MEQ PO TBCR
40.0000 meq | EXTENDED_RELEASE_TABLET | Freq: Once | ORAL | Status: AC
  Administered 2018-04-04: 40 meq via ORAL
  Filled 2018-04-04 (×2): qty 4

## 2018-04-04 MED ORDER — DOBUTAMINE IN D5W 4-5 MG/ML-% IV SOLN
7.5000 ug/kg/min | INTRAVENOUS | Status: DC
  Administered 2018-04-05 – 2018-04-06 (×2): 7.5 ug/kg/min via INTRAVENOUS
  Filled 2018-04-04 (×4): qty 250

## 2018-04-04 NOTE — Progress Notes (Signed)
Blood infusion completed.  Patient resting without distress or discomfort.  Will continue to monitor.

## 2018-04-04 NOTE — Progress Notes (Signed)
Patient accidentally pulled out IV to left wrist, catheter was intact.  Small amount bleeding noted from the site pressure dressing applied at this time.  No c/o discomfort or swelling observed.  Will monitor.

## 2018-04-04 NOTE — Progress Notes (Addendum)
LVAD Coordinator Rounding Note:  Admitted 04/02/18 to Dr. Haroldine Laws due to ascites and CHF with fluid overload.   HM III LVAD implanted on 09/09/16 by Dr. Cyndia Bent under Destination Therapy criteria.  Pt sitting on side of bed eating breakfast. Reports he had a "good night".  Vital signs: Temp:  97.7 HR: 81 Doppler Pressure:  78 Automatic BP:  71/55 (63) O2 Sat: 99 % on RA Wt: 215>211>203 lbs  LVAD interrogation reveals:  Speed: 5800 Flow: 5.6 Power: 4.5 w PI:  2.5 Alarms: none Events: 11 Pi events Fixed speed:  5800 Low speed limit: 5500  Drive Line: left abdominal sorbaview dressing dry and intact; anchor intact and accurately applied. Existing VAD dressing removed and site care performed using sterile technique. Drive line exit site cleaned with betadine swabs x 2, allowed to dry, and Sorbaview dressing without bio patch re-applied. Exit site healed and incorporated, the velour is fully implanted at exit site. No redness, tenderness, drainage, foul odor or rash noted. Drive line anchor re-applied. Next dressing change due 04/11/18.  Labs:  LDH trend: [phone]  INR trend: 2.30>2.09>2.09  Gtts: Dobutamine 7.5 mcg/kg/min  Anticoagulation Plan: -INR Goal:  1.8 - 2.3 -ASA Dose: no ASA (history of GI bleeding)  Device: -Protect; end of service 08/2016 -Therapies:  Off Last check: complete interrogation 05/26/2017. DDD (lower rate 60); BiV pacing with 96% AS-VP  Adverse Events on VAD: 11/30/16> Milrinone gtt at discharge 01/27/17>dobutamine at 5 mcg/kg/mingtt at discharge 03/08/17> RV failure 04/29/17> symptomatic anemia and melena. EGD revealed stomach ulcers. Carafate added; ASA stopped 05/31/17>Ongoing symptomatic anemia and melena; capsule study mild antral gastritis; poss tiny AVM right colon. Lower MAPS, Losartan cut back. 06/2017> admit with syncope, AMS, Dobutamine at 7.78mcg 11/09/17>Marked ascites, RV failure, and hepatic encephalopathy. Paracentesis on 2/7 and 2/12 with  >11 liters fluid removed 12/27/17>Outpatient paracentesis with 2.8 liters removed 01/11/18>IP paracentesis with 4.8 liters removed 01/16/18>IP paracentesis w/2.4L removed 02/15/18>OP paracentesis w/ 5 L removed 02/23/18>IP paracentesis w/ 5.6 L removed   Plan/Recommendations:  1. Will need weekly dressing changes; due 04/11/18. Bedside RN may change dressing w/above instructions. 2. Please call VAD pager with any VAD equipment or drive line issues.  Zada Girt RN, VAD Coordiantor 24/7 VAD pager: [phone]

## 2018-04-04 NOTE — Clinical Social Work Note (Signed)
Clinical Social Work Assessment  Patient Details  Name: Ronald Miller. MRN: [id number] Date of Birth: [birth year]  Date of referral:  04/04/18               Reason for consult:  Facility Placement                Permission sought to share information with:  Family Supports Permission granted to share information::  Yes, Verbal Permission Granted  Name::     Iantha Fallen  Agency::  [facility]  Relationship::  sister  Contact Information:  [phone]  Housing/Transportation Living arrangements for the past 2 months:  [place] of Information:  Patient Patient Interpreter Needed:  None Criminal Activity/Legal Involvement Pertinent to Current Situation/Hospitalization:  No - Comment as needed Significant Relationships:  Siblings, [employer] Lives with:  Facility Resident Do you feel safe going back to the place where you live?  Yes Need for family participation in patient care:  Yes (Comment)  Care giving concerns:  NO family at bedside. Patient is from [facility] and has been in and out of the facility in the past. Patient stated at this time he is now there as a long term care resident.    Social Worker assessment / plan:  Patient stated he would like to go back to [facility] today but stated he understands his not medically ready. CSW reached out to facility and spoke with Narda Rutherford (admission coordinator). Narda Rutherford stated that bed is being held for patient and will be ready once patient is medically cleared  Employment status:  Retired Nurse, adult PT Recommendations:  [place] / Referral to community resources:  [place]  Patient/Family's Response to care:  [place] appreciates CSW role in care  Patient/Family's Understanding of and Emotional Response to Diagnosis, Current Treatment, and Prognosis:  Patient wanting to go back to his facility  Emotional  Assessment Appearance:  Appears stated age Attitude/Demeanor/Rapport:  Engaged Affect (typically observed):  Accepting Orientation:  Oriented to Self, Oriented to Situation, Oriented to Place, Oriented to  Time Alcohol / Substance use:    Psych involvement (Current and /or in the community):  No (Comment)  Discharge Needs  Concerns to be addressed:  Care Coordination Readmission within the last 30 days:  No Current discharge risk:  Dependent with Mobility Barriers to Discharge:  Continued Medical Work up   [employer], LCSW 04/04/2018, 12:28 PM

## 2018-04-04 NOTE — Progress Notes (Signed)
Ronald Miller for coumadin Indication: LVAD  Allergies  Allergen Reactions  . Chlorhexidine Gluconate Itching and Rash  . Codeine Nausea And Vomiting and Other (See Comments)    In high doses  . Lipitor [Atorvastatin] Nausea Only and Other (See Comments)    Nausea with high doses, tolerates 20mg  dose (08/22/16)  . Tape Other (See Comments)    Paper tape is ok    Patient Measurements: Height: 5\' 9"  (175.3 cm) Weight: 203 lb 4.8 oz (92.2 kg) IBW/kg (Calculated) : 70.7   Vital Signs: Temp: 97.9 F (36.6 C) (07/02 0700) Temp Source: Oral (07/02 0700) BP: 87/71 (07/02 0700) Pulse Rate: 122 (07/02 0700)  Labs: Recent Labs    04/02/18 1849 04/03/18 0606 04/04/18 0345  HGB 7.5* 7.1* 8.9*  HCT 27.3* 25.6* 30.2*  PLT 189 162 159  LABPROT 25.1* 23.3* 23.3*  INR 2.30 2.09 2.09  CREATININE 2.27* 2.10* 1.55*    Estimated Creatinine Clearance: 56.1 mL/min (A) (by C-G formula based on SCr of 1.55 mg/dL (H)).   Medical History: Past Medical History:  Diagnosis Date  . AICD (automatic cardioverter/defibrillator) present   . Anxiety   . ASCVD (arteriosclerotic cardiovascular disease)   . Chronic systolic CHF (congestive heart failure) ([place])   . COPD (chronic obstructive pulmonary disease) ([place])   . Coronary artery disease   . Depression   . GI bleed   . High cholesterol   . History of blood transfusion   . History of cocaine abuse   . Hypertension   . LVAD (left ventricular assist device) present ([place])   . Presence of permanent cardiac pacemaker   . Shortness of breath dyspnea   . Suicidal ideation   . Tobacco abuse   . Type II diabetes mellitus ([facility])     Medications:  Medications Prior to Admission  Medication Sig Dispense Refill Last Dose  . acetaminophen (TYLENOL) 500 MG tablet Take 1,000 mg by mouth every 6 (six) hours as needed for moderate pain.    unknown  . amiodarone (PACERONE) 200 MG tablet TAKE ONE TABLET BY MOUTH ONCE  DAILY 60 tablet 6 04/02/2018 at 800  . Cholecalciferol (VITAMIN D3) 3000 units TABS Take 3,000 Units by mouth daily.   04/02/2018 at 800  . citalopram (CELEXA) 20 MG tablet Take 1 tablet (20 mg total) by mouth daily. 30 tablet 6 04/02/2018 at 800  . cyclobenzaprine (FLEXERIL) 10 MG tablet Take 1 tablet (10 mg total) by mouth 3 (three) times daily as needed for muscle spasms. (Patient taking differently: Take 10 mg by mouth every 8 (eight) hours as needed for muscle spasms. ) 30 tablet 1 03/18/2018 at 2158  . digoxin (DIGOX) 0.125 MG tablet Take 1 tablet (0.125 mg total) daily by mouth. 30 tablet 6 04/02/2018 at 800  . DOBUTamine (DOBUTREX) 4-5 MG/ML-% infusion Inject 714.75 mcg/min into the vein continuous. PER [facility] pharmacy 250 mL 11 04/02/2018 at 1430  . docusate sodium (COLACE) 100 MG capsule Take 100 mg by mouth at bedtime as needed (constipation).    unknown  . gabapentin (NEURONTIN) 300 MG capsule Take 1 capsule (300 mg total) by mouth 2 (two) times daily. 180 capsule 6 04/02/2018 at 800  . insulin aspart (NOVOLOG FLEXPEN) 100 UNIT/ML FlexPen Inject 5 Units into the skin 3 (three) times daily with meals. 15 mL 11 04/02/2018 at 1200  . Insulin Detemir (LEVEMIR FLEXTOUCH) 100 UNIT/ML Pen Inject 10 Units into the skin at bedtime.   04/01/2018 at 2200  .  lactulose (CHRONULAC) 10 GM/15ML solution Take 67.5 mLs (45 g total) by mouth 3 (three) times daily. 240 mL 6 04/02/2018 at 1300  . levothyroxine (SYNTHROID, LEVOTHROID) 50 MCG tablet Take 1 tablet (50 mcg total) by mouth daily before breakfast. 30 tablet 6 04/02/2018 at 630  . magnesium oxide (MAG-OX) 400 (241.3 Mg) MG tablet Take 1 tablet (400 mg total) by mouth daily. 30 tablet 6 04/02/2018 at 800  . metolazone (ZAROXOLYN) 2.5 MG tablet Take 2.5 mg by mouth daily.    04/02/2018 at 900  . midodrine (PROAMATINE) 10 MG tablet Take 1 tablet (10 mg total) by mouth 3 (three) times daily with meals. 90 tablet 6 04/02/2018 at 1200  . Multiple Vitamin (MULTIVITAMIN WITH  MINERALS) TABS tablet Take 1 tablet by mouth daily. Thera-M   04/02/2018 at 800  . ondansetron (ZOFRAN) 4 MG tablet Take 4 mg by mouth every 8 (eight) hours as needed for nausea or vomiting.   04/02/2018 at 649  . OXYGEN Inhale 2 L into the lungs as needed (for SATS below 90%).   unknown  . pantoprazole (PROTONIX) 40 MG tablet TAKE 1 TABLET BY MOUTH TWICE DAILY WITH A MEAL 60 tablet 6 04/02/2018 at 800  . potassium chloride SA (K-DUR,KLOR-CON) 20 MEQ tablet Take 4 tablets (80 mEq total) by mouth 2 (two) times daily. Take 40 meq in am and 20 meq in pm (Patient taking differently: Take 80 mEq by mouth 2 (two) times daily. ) 120 tablet 3 04/02/2018 at 900  . senna-docusate (SENOKOT S) 8.6-50 MG tablet Take 2 tablets by mouth at bedtime as needed for mild constipation. 30 tablet 6 unknown  . Skin Protectants, Misc. (MINERIN) CREA Apply 1 application topically See admin instructions. Apply to legs daily for dry skin   04/02/2018 at 1430  . spironolactone (ALDACTONE) 25 MG tablet Take 1 tablet (25 mg total) by mouth daily. 30 tablet 6 04/02/2018 at 800  . sucralfate (CARAFATE) 1 GM/10ML suspension Take 10 mLs (1 g total) by mouth 4 (four) times daily -  with meals and at bedtime. 420 mL 6 04/02/2018 at 1200  . torsemide (DEMADEX) 100 MG tablet Take 1 tablet (100 mg total) by mouth 2 (two) times daily. Take 100mg  in the morning and 50mg  in the evening (Patient taking differently: Take 100 mg by mouth 2 (two) times daily. 8am and 4pm)   04/02/2018 at 800  . warfarin (COUMADIN) 5 MG tablet Take 5mg  (1tab) Monday, Wednesday and Friday, take 7.5mg  (1.5tab) Tuesday, Thursday, Saturday and Sunday (Patient taking differently: Take 5 mg by mouth See admin instructions. New order 03/15/18: take 1 tablet (5 mg) by mouth Monday, Wednesday, Friday at 5pm) 60 tablet 3 03/31/2018 at 1700  . warfarin (COUMADIN) 7.5 MG tablet Take 7.5 mg by mouth See admin instructions. Take 1 tablet (7.5 mg) by mouth every Tuesday, Thursday, Saturday  and Sunday at 5pm   04/01/2018 at 1700  . Insulin Glargine (LANTUS SOLOSTAR) 100 UNIT/ML Solostar Pen Inject 10 Units into the skin daily at 10 pm. (Patient not taking: Reported on 04/02/2018) 1 pen 6 Not Taking at Unknown time  . ondansetron (ZOFRAN ODT) 4 MG disintegrating tablet Take 1 tablet (4 mg total) by mouth every 8 (eight) hours as needed for nausea. (Patient not taking: Reported on 04/02/2018) 6 tablet 0 Not Taking at Unknown time  . traZODone (DESYREL) 50 MG tablet Take 1 tablet (50 mg total) by mouth at bedtime as needed for sleep. (Patient not taking: Reported  on 04/02/2018) 30 tablet 6 Not Taking at Unknown time    Assessment: 62 yo man to continue coumadin for LVAD.  Warfarin regimen as of 6/25 at outpatient anticoag visit was 7.5 mg daily except 5 mg on Mon, Wed, Friday.   INR is 2.3 on admission. Last dose of warfarin was 6/28 as per SNF MAR? - missed dose on 6/30. INR today remains stable at 2.09. Received higher dose of 7.5 mg last night. Hgb is 8.9, plt 159 s/p 2 PRBCs on 7/1. No s/sx of bleeding. On home amiodarone dose.   Goal of Therapy:  INR 1.8-2.4 Monitor platelets by anticoagulation protocol: Yes   Plan:  Order warfarin 7.5 mg today as per home regimen Check daily PT/INR Monitor for bleeding complications  Doylene Canard, PharmD Clinical Pharmacist  Pager: [phone] Phone: [phone] 04/04/2018,10:33 AM

## 2018-04-04 NOTE — Plan of Care (Signed)
  Problem: Education: Goal: Knowledge of General Education information will improve Outcome: Progressing   Problem: Activity: Goal: Risk for activity intolerance will decrease Outcome: Progressing   Problem: Skin Integrity: Goal: Risk for impaired skin integrity will decrease Outcome: Progressing

## 2018-04-04 NOTE — Progress Notes (Signed)
Cardiology Update  Potassium this AM came back at 2.9. With issues of hyperkalemia this admission will supplement gently starting with 40 meq of K[place].  Roselyn Meier, MD

## 2018-04-04 NOTE — Progress Notes (Addendum)
Advanced Heart Failure VAD Team Note  PCP-Cardiologist: Glori Bickers, MD   Subjective:    Brisk UOP, but only net negative -550 mls. Weight down 8 lbs on IV lasix. Now 203 pounds.  Creatinine improving 2.27 > 2.10 > 1.55. Na 129, K 2.9  Hemoglobin 7.5 > 7.1 > 2 uPRBcs > 8.9, INR 2.09. Denies bleeding.  Remains on dobutamine drip @ 7.5 mcg/kg/min.  Co-ox 67%. CVP 18  Paracentesis 04/03/18 in IR with 3 L off.   More alert this morning. Denies CP, SOB,orthopnea, PND, dizziness, or bleeding. No nausea. No encephalopathy. Asking for more fluid.   LVAD INTERROGATION:  HeartMate 3 LVAD:   Flow 5.5 liters/min, speed 5800, power 5, PI 2.6. 11 PI events in 24 hours. Personally reviewed.   Objective:    Vital Signs:   Temp:  [97.5 F (36.4 C)-98.2 F (36.8 C)] 97.9 F (36.6 C) (07/02 0700) Pulse Rate:  [78-122] 122 (07/02 0700) Resp:  [16-22] 16 (07/02 0700) BP: (82-112)/(30-89) 87/71 (07/02 0700) SpO2:  [85 %-100 %] 85 % (07/02 0700) Weight:  [203 lb 4.8 oz (92.2 kg)] 203 lb 4.8 oz (92.2 kg) (07/02 0411) Last BM Date: 04/04/18 Mean arterial Pressure 78-80  Intake/Output:   Intake/Output Summary (Last 24 hours) at 04/04/2018 0756 Last data filed at 04/04/2018 0420 Gross per 24 hour  Intake 2005.68 ml  Output 2550 ml  Net -544.32 ml     Physical Exam   CVP 18  General: NAD.  HEENT: Normal. Anicteric  Neck: Supple, JVP 18. Carotids OK.  Cardiac:  Mechanical heart sounds with LVAD hum present.  Lungs:  CTAB, normal effort.  No wheeze Abdomen:  +distended, NT, no HSM. No bruits or masses. +BS  LVAD exit site:. Dressing dry and intact. Stabilization device present and accurately applied.  Extremities:  Warm and dry. No cyanosis, clubbing, rash, BLE 1+ edema, L>R Neuro: alert & oriented x 3, cranial nerves grossly intact. moves all 4 extremities w/o difficulty. Affect pleasant. No asterixis     Telemetry   AV paced 80s. Personally reviewed.   Labs   Basic Metabolic  Panel: Recent Labs  Lab 03/28/18 0908 04/02/18 1849 04/03/18 0606 04/04/18 0345  NA 130* 125* 126* 129*  K 3.3* 6.5* 4.6 2.9*  CL 87* 87* 86* 87*  CO2 32 25 30 30   GLUCOSE 138* 247* 182* 149*  BUN 45* 43* 43* 40*  CREATININE 1.83* 2.27* 2.10* 1.55*  CALCIUM 9.0 9.0 8.7* 8.6*  MG  --  2.2 2.1  --     Liver Function Tests: Recent Labs  Lab 04/02/18 1849  AST 31  ALT 18  ALKPHOS 100  BILITOT 1.1  PROT 7.3  ALBUMIN 3.4*   No results for input(s): LIPASE, AMYLASE in the last 168 hours. Recent Labs  Lab 03/28/18 0908 04/02/18 1849 04/03/18 0606  AMMONIA 52* 59* 23    CBC: Recent Labs  Lab 03/28/18 0908 04/02/18 1849 04/03/18 0606 04/04/18 0345  WBC 6.3 9.9 6.3 5.6  NEUTROABS  --  8.2*  --   --   HGB 7.9* 7.5* 7.1* 8.9*  HCT 28.1* 27.3* 25.6* 30.2*  MCV 77.2* 78.9 77.3* 79.9  PLT 184 189 162 159    INR: Recent Labs  Lab 03/28/18 0908 04/02/18 1849 04/03/18 0606 04/04/18 0345  INR 1.41 2.30 2.09 2.09    Other results:  EKG:    Imaging   Dg Chest Portable 1 View  Result Date: 04/02/2018 CLINICAL DATA:  Shortness of breath.  EXAM: PORTABLE CHEST 1 VIEW COMPARISON:  Chest x-ray dated Feb 23, 2018. FINDINGS: Unchanged left chest wall AICD. Unchanged tunneled right internal jugular central venous catheter. Unchanged LVAD device. Stable cardiomegaly status post CABG. Normal pulmonary vascularity. 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 No consolidation, large pleural effusion, or pneumothorax. No acute osseous abnormality. IMPRESSION: No active disease. Electronically Signed   By: Titus Dubin M.D.   On: 04/02/2018 19:42   Ir Paracentesis  Result Date: 04/03/2018 INDICATION: Patient with history HF, LVAD, and recurrent ascites. Request is made for therapeutic paracentesis. EXAM: ULTRASOUND GUIDED THERAPEUTIC PARACENTESIS MEDICATIONS: 10 mL of 2% lidocaine COMPLICATIONS: None immediate. PROCEDURE: Informed written consent was obtained  from the patient after a discussion of the risks, benefits and alternatives to treatment. A timeout was performed prior to the initiation of the procedure. Initial ultrasound scanning demonstrates a moderate amount of ascites within the right lower abdominal quadrant. The right lower abdomen was prepped and draped in the usual sterile fashion. 1% lidocaine was used for local anesthesia. Following this, a 19 gauge, 7-cm, Yueh catheter was introduced. An ultrasound image was saved for documentation purposes. The paracentesis was performed. The catheter was removed and a dressing was applied. The patient tolerated the procedure well without immediate post procedural complication. FINDINGS: A total of approximately 3 L of clear gold fluid was removed. IMPRESSION: Successful ultrasound-guided paracentesis yielding 3 L of peritoneal fluid. Read by: Earley Abide, PA-C Electronically Signed   By: Sandi Mariscal M.D.   On: 04/03/2018 10:03     Medications:     Scheduled Medications: . amiodarone  200 mg Oral Daily  . Chlorhexidine Gluconate Cloth  6 each Topical Q0600  . citalopram  20 mg Oral Daily  . furosemide  80 mg Intravenous BID  . gabapentin  300 mg Oral BID  . insulin aspart  5 Units Subcutaneous TID WC  . insulin glargine  10 Units Subcutaneous Q2200  . lactulose  45 g Oral TID  . levothyroxine  50 mcg Oral QAC breakfast  . magnesium oxide  400 mg Oral Daily  . midodrine  10 mg Oral TID WC  . multivitamin with minerals  1 tablet Oral Daily  . mupirocin ointment  1 application Nasal BID  . pantoprazole  40 mg Oral Daily  . sucralfate  1 g Oral TID WC & HS  . Warfarin - Pharmacist Dosing Inpatient   Does not apply q1800    Infusions: . DOBUTamine 7.5 mcg/kg/min (04/04/18 0335)    PRN Medications: acetaminophen, acetaminophen, cyclobenzaprine, docusate sodium, lidocaine, ondansetron (ZOFRAN) IV, ondansetron (ZOFRAN) IV, senna-docusate, sodium chloride flush, traZODone   Patient Profile    Ronald Milleris a 62 y.o.male with history of chronic systolic HF s/p [employer] ICD 2014, CAD s/p CABG in 2000, HTN, Hx of cocaine abuse, Tobacco abuse, Depression, PAF, and COPD. HM3 placed 09/09/2016. In April 2018 he was placed on milrinone but later switched to dobutamine. He remains on dobutamine 7.72mcg.   Admitted with volume overload and AKI.   Assessment/Plan:    1. Acute on Chronic end-stage biventricular systolic HF with R>>L HF: LVEF 15% due to ICM -> s/p Echo 08/24/16 LVEF 15%, RV mild dilated, moderately reduced. -->S/P HM3 LVAD 09/09/2016.  Has been turned down for transplant at [facility].  - Echo 2/18 EF 15. Severe RV dysfunction - Complicated by RV failure. Failed milrinone due to persistent RV failure/shock..   - Chronic NYHA IIIB-IV despite VAD support. Volume status improving.  -  Continue dobutamine 7.5 mcg and midodrine. Coox 67%. CVP 18.  - Continue 80 mg IV lasix BID. - Continue midodrine 10 TID for BP support in setting of cirrhotic physiology - Off sildenafil due to hypotension - No beta blocker with RV failure   2. Acute on chronic renal failure (CKD3 at baseline) - Baseline creatinine 1.5-1.6 - Improving with diuresis. Creatinine 1.55 this am.   3. Hyperkalemia > Hypokalemia - Kayexalate and IV lasix given in ER. Potassium and spiro held - K 2.9 this am. 40 meq K ordered. Will order more supp. Recheck BMET this afternoon.   4. Hyponatremia - Will follow. Suspect he may need tolvaptan in am - Free water restrict. Na improved 129 this am.   5. Heartmate-3 LVAD - VAD interrogated personally. Parameters stable. - INR today 2.09. Warfarin per pharmacy - LDH stable 146 - Driveline OK.  - Not transplant candidate due to social issues, noncompliance and cirrhosis (has been turned down by [facility])  6. Cirrhosis with ascites and hepatic encephalopathy - IR paracentesis 04/03/18 with 3 L off - Encephalopathy controlled with lactulose. Ammonia normal  7. CAD:  Severe 3v- CAD s/p CABG with occluded grafts as above except for LIMA.  - No s/s ischemia. - Continue current therapy.   8. DMII:  - Cover with SSI. No change.  .  9. Atrial fibrillation, paroxysmal - Continue amio 200 daily. Careful with cirrhosis. No change.   10.  Anxiety/Depression - Continue Celexa 20mg  daily. No change.  - Social issues still a concern, though now long-term SNF.   11. Anemia, iron-deficient:   - Has had GI work up with EGD and Capsule Endoscopy showing severe gastropathy  - Recently received 2UPRBCS as outpatient. Hgb 7.5 > 7.1 > 2 uPRBC > 8.9   12. DNR/DNI  I reviewed the LVAD parameters from today, and compared the results to the patient's prior recorded data.  No programming changes were made.  The LVAD is functioning within specified parameters.  The patient performs LVAD self-test daily.  LVAD interrogation was negative for any significant power changes, alarms or PI events/speed drops.  LVAD equipment check completed and is in good working order.  Back-up equipment present.   LVAD education done on emergency procedures and precautions and reviewed exit site care.  Length of Stay: [place], NP 04/04/2018, 7:56 AM  VAD Team --- VAD ISSUES ONLY--- Pager [phone] (7am - 7am)  Advanced Heart Failure Team  Pager [phone] (M-F; 7a - 4p)  Please contact [facility] for night-coverage after hours (4p -7a ) and weekends on [email or web address]  Patient seen and examined with the above-signed Advanced Practice Provider and/or Housestaff. I personally reviewed laboratory data, imaging studies and relevant notes. I independently examined the patient and formulated the important aspects of the plan. I have edited the note to reflect any of my changes or salient points. I have personally discussed the plan with the patient and/or family.  Continues to struggle with RV failure. Weight coming down but CVP still 18 and about 10 pounds from baseline. Co-ox 67% on  high-dose dobutamine. MAPs stable with midodrine. Creatinine improving. Will continue to diurese. Supp K aggressively. Continue lactulose for hepatic encephalopathy. Hgb now stable. NA 129. Continue to restrict free water. Hopefully back to [facility] on Friday.   VAD interrogated personally. Parameters stable.  Glori Bickers, MD  7:08 PM

## 2018-04-04 NOTE — Progress Notes (Signed)
Patient started second unit of PRBC's with two nurse verified this evening tolerating infusion without difficulty.  Patient knows to call for assist.  Bed alarms in use and floor mats remain in place by the bedside. Denies pain or respiratory distress.  Stated felt nauseous but refuse medication instead requested ginger ale at this time.  Will monitor.

## 2018-04-04 NOTE — Progress Notes (Signed)
Patient denied pain or feeling nauseated at this time.  Blood continues infusing without difficulty.  Will continue to monitor.

## 2018-04-05 LAB — CBC
HEMATOCRIT: 31.2 % — AB (ref 39.0–52.0)
Hemoglobin: 9 g/dL — ABNORMAL LOW (ref 13.0–17.0)
MCH: 23.1 pg — ABNORMAL LOW (ref 26.0–34.0)
MCHC: 28.8 g/dL — AB (ref 30.0–36.0)
MCV: 80 fL (ref 78.0–100.0)
Platelets: 157 10*3/uL (ref 150–400)
RBC: 3.9 MIL/uL — ABNORMAL LOW (ref 4.22–5.81)
RDW: 19.4 % — AB (ref 11.5–15.5)
WBC: 5 10*3/uL (ref 4.0–10.5)

## 2018-04-05 LAB — BASIC METABOLIC PANEL
ANION GAP: 8 (ref 5–15)
ANION GAP: 8 (ref 5–15)
BUN: 33 mg/dL — ABNORMAL HIGH (ref 8–23)
BUN: 31 mg/dL — ABNORMAL HIGH (ref 8–23)
CALCIUM: 8.6 mg/dL — AB (ref 8.9–10.3)
CHLORIDE: 89 mmol/L — AB (ref 98–111)
CO2: 32 mmol/L (ref 22–32)
CO2: 33 mmol/L — AB (ref 22–32)
CREATININE: 1.46 mg/dL — AB (ref 0.61–1.24)
CREATININE: 1.35 mg/dL — AB (ref 0.61–1.24)
Calcium: 8.6 mg/dL — ABNORMAL LOW (ref 8.9–10.3)
Chloride: 88 mmol/L — ABNORMAL LOW (ref 98–111)
GFR calc Af Amer: 58 mL/min — ABNORMAL LOW (ref >60)
GFR calc non Af Amer: 55 mL/min — ABNORMAL LOW (ref >60)
GFR, EST NON AFRICAN AMERICAN: 50 mL/min — AB (ref >60)
Glucose, Bld: 157 mg/dL — ABNORMAL HIGH (ref 70–99)
Glucose, Bld: 170 mg/dL — ABNORMAL HIGH (ref 70–99)
Potassium: 3.7 mmol/L (ref 3.5–5.1)
Potassium: 3.6 mmol/L (ref 3.5–5.1)
SODIUM: 130 mmol/L — AB (ref 135–145)
Sodium: 128 mmol/L — ABNORMAL LOW (ref 135–145)

## 2018-04-05 LAB — COOXEMETRY PANEL
CARBOXYHEMOGLOBIN: 2.5 % — AB (ref 0.5–1.5)
Methemoglobin: 0.9 % (ref 0.0–1.5)
O2 SAT: 60.6 %
Total hemoglobin: 9.5 g/dL — ABNORMAL LOW (ref 12.0–16.0)

## 2018-04-05 LAB — GLUCOSE, CAPILLARY
GLUCOSE-CAPILLARY: 140 mg/dL — AB (ref 70–99)
GLUCOSE-CAPILLARY: 153 mg/dL — AB (ref 70–99)
Glucose-Capillary: 156 mg/dL — ABNORMAL HIGH (ref 70–99)
Glucose-Capillary: 153 mg/dL — ABNORMAL HIGH (ref 70–99)

## 2018-04-05 LAB — LACTATE DEHYDROGENASE: LDH: 145 U/L (ref 98–192)

## 2018-04-05 LAB — PROTIME-INR
INR: 2.02
Prothrombin Time: 22.7 seconds — ABNORMAL HIGH (ref 11.4–15.2)

## 2018-04-05 MED ORDER — FUROSEMIDE 10 MG/ML IJ SOLN
80.0000 mg | Freq: Three times a day (TID) | INTRAMUSCULAR | Status: DC
  Administered 2018-04-05 – 2018-04-06 (×5): 80 mg via INTRAVENOUS
  Filled 2018-04-05 (×6): qty 8

## 2018-04-05 MED ORDER — POTASSIUM CHLORIDE CRYS ER 20 MEQ PO TBCR
40.0000 meq | EXTENDED_RELEASE_TABLET | Freq: Two times a day (BID) | ORAL | Status: DC
  Administered 2018-04-05 – 2018-04-08 (×7): 40 meq via ORAL
  Filled 2018-04-05 (×6): qty 2

## 2018-04-05 MED ORDER — WARFARIN SODIUM 5 MG PO TABS
5.0000 mg | ORAL_TABLET | Freq: Once | ORAL | Status: AC
  Administered 2018-04-05: 5 mg via ORAL
  Filled 2018-04-05: qty 1

## 2018-04-05 MED ORDER — METOLAZONE 2.5 MG PO TABS
2.5000 mg | ORAL_TABLET | Freq: Two times a day (BID) | ORAL | Status: DC
  Administered 2018-04-05 – 2018-04-08 (×7): 2.5 mg via ORAL
  Filled 2018-04-05 (×7): qty 1

## 2018-04-05 NOTE — Progress Notes (Signed)
[place] for coumadin Indication: LVAD  Allergies  Allergen Reactions  . Chlorhexidine Gluconate Itching and Rash  . Codeine Nausea And Vomiting and Other (See Comments)    In high doses  . Lipitor [Atorvastatin] Nausea Only and Other (See Comments)    Nausea with high doses, tolerates 20mg  dose (08/22/16)  . Tape Other (See Comments)    Paper tape is ok    Patient Measurements: Height: 5\' 9"  (175.3 cm) Weight: 203 lb 7.8 oz (92.3 kg) IBW/kg (Calculated) : 70.7   Vital Signs: Temp: 97.9 F (36.6 C) (07/03 0755) Temp Source: Oral (07/03 0755) BP: 98/76 (07/03 0755) Pulse Rate: 84 (07/03 0755)  Labs: Recent Labs    04/03/18 0606 04/04/18 0345 04/04/18 1446 04/05/18 0500 04/05/18 0630  HGB 7.1* 8.9*  --  9.0*  --   HCT 25.6* 30.2*  --  31.2*  --   PLT 162 159  --  157  --   LABPROT 23.3* 23.3*  --   --  22.7*  INR 2.09 2.09  --   --  2.02  CREATININE 2.10* 1.55* 1.42* 1.46*  --     Estimated Creatinine Clearance: 59.6 mL/min (A) (by C-G formula based on SCr of 1.46 mg/dL (H)).   Medical History: Past Medical History:  Diagnosis Date  . AICD (automatic cardioverter/defibrillator) present   . Anxiety   . ASCVD (arteriosclerotic cardiovascular disease)   . Chronic systolic CHF (congestive heart failure) ([place])   . COPD (chronic obstructive pulmonary disease) ([place])   . Coronary artery disease   . Depression   . GI bleed   . High cholesterol   . History of blood transfusion   . History of cocaine abuse   . Hypertension   . LVAD (left ventricular assist device) present ([place])   . Presence of permanent cardiac pacemaker   . Shortness of breath dyspnea   . Suicidal ideation   . Tobacco abuse   . Type II diabetes mellitus (H[facility])     Medications:  Medications Prior to Admission  Medication Sig Dispense Refill Last Dose  . acetaminophen (TYLENOL) 500 MG tablet Take 1,000 mg by mouth every 6 (six) hours as needed for  moderate pain.    unknown  . amiodarone (PACERONE) 200 MG tablet TAKE ONE TABLET BY MOUTH ONCE DAILY 60 tablet 6 04/02/2018 at 800  . Cholecalciferol (VITAMIN D3) 3000 units TABS Take 3,000 Units by mouth daily.   04/02/2018 at 800  . citalopram (CELEXA) 20 MG tablet Take 1 tablet (20 mg total) by mouth daily. 30 tablet 6 04/02/2018 at 800  . cyclobenzaprine (FLEXERIL) 10 MG tablet Take 1 tablet (10 mg total) by mouth 3 (three) times daily as needed for muscle spasms. (Patient taking differently: Take 10 mg by mouth every 8 (eight) hours as needed for muscle spasms. ) 30 tablet 1 03/18/2018 at 2158  . digoxin (DIGOX) 0.125 MG tablet Take 1 tablet (0.125 mg total) daily by mouth. 30 tablet 6 04/02/2018 at 800  . DOBUTamine (DOBUTREX) 4-5 MG/ML-% infusion Inject 714.75 mcg/min into the vein continuous. PER [facility] pharmacy 250 mL 11 04/02/2018 at 1430  . docusate sodium (COLACE) 100 MG capsule Take 100 mg by mouth at bedtime as needed (constipation).    unknown  . gabapentin (NEURONTIN) 300 MG capsule Take 1 capsule (300 mg total) by mouth 2 (two) times daily. 180 capsule 6 04/02/2018 at 800  . insulin aspart (NOVOLOG FLEXPEN) 100 UNIT/ML FlexPen Inject 5  Units into the skin 3 (three) times daily with meals. 15 mL 11 04/02/2018 at 1200  . Insulin Detemir (LEVEMIR FLEXTOUCH) 100 UNIT/ML Pen Inject 10 Units into the skin at bedtime.   04/01/2018 at 2200  . lactulose (CHRONULAC) 10 GM/15ML solution Take 67.5 mLs (45 g total) by mouth 3 (three) times daily. 240 mL 6 04/02/2018 at 1300  . levothyroxine (SYNTHROID, LEVOTHROID) 50 MCG tablet Take 1 tablet (50 mcg total) by mouth daily before breakfast. 30 tablet 6 04/02/2018 at 630  . magnesium oxide (MAG-OX) 400 (241.3 Mg) MG tablet Take 1 tablet (400 mg total) by mouth daily. 30 tablet 6 04/02/2018 at 800  . metolazone (ZAROXOLYN) 2.5 MG tablet Take 2.5 mg by mouth daily.    04/02/2018 at 900  . midodrine (PROAMATINE) 10 MG tablet Take 1 tablet (10 mg total) by mouth 3  (three) times daily with meals. 90 tablet 6 04/02/2018 at 1200  . Multiple Vitamin (MULTIVITAMIN WITH MINERALS) TABS tablet Take 1 tablet by mouth daily. Thera-M   04/02/2018 at 800  . ondansetron (ZOFRAN) 4 MG tablet Take 4 mg by mouth every 8 (eight) hours as needed for nausea or vomiting.   04/02/2018 at 649  . OXYGEN Inhale 2 L into the lungs as needed (for SATS below 90%).   unknown  . pantoprazole (PROTONIX) 40 MG tablet TAKE 1 TABLET BY MOUTH TWICE DAILY WITH A MEAL 60 tablet 6 04/02/2018 at 800  . potassium chloride SA (K-DUR,KLOR-CON) 20 MEQ tablet Take 4 tablets (80 mEq total) by mouth 2 (two) times daily. Take 40 meq in am and 20 meq in pm (Patient taking differently: Take 80 mEq by mouth 2 (two) times daily. ) 120 tablet 3 04/02/2018 at 900  . senna-docusate (SENOKOT S) 8.6-50 MG tablet Take 2 tablets by mouth at bedtime as needed for mild constipation. 30 tablet 6 unknown  . Skin Protectants, Misc. (MINERIN) CREA Apply 1 application topically See admin instructions. Apply to legs daily for dry skin   04/02/2018 at 1430  . spironolactone (ALDACTONE) 25 MG tablet Take 1 tablet (25 mg total) by mouth daily. 30 tablet 6 04/02/2018 at 800  . sucralfate (CARAFATE) 1 GM/10ML suspension Take 10 mLs (1 g total) by mouth 4 (four) times daily -  with meals and at bedtime. 420 mL 6 04/02/2018 at 1200  . torsemide (DEMADEX) 100 MG tablet Take 1 tablet (100 mg total) by mouth 2 (two) times daily. Take 100mg  in the morning and 50mg  in the evening (Patient taking differently: Take 100 mg by mouth 2 (two) times daily. 8am and 4pm)   04/02/2018 at 800  . warfarin (COUMADIN) 5 MG tablet Take 5mg  (1tab) Monday, Wednesday and Friday, take 7.5mg  (1.5tab) Tuesday, Thursday, Saturday and Sunday (Patient taking differently: Take 5 mg by mouth See admin instructions. New order 03/15/18: take 1 tablet (5 mg) by mouth Monday, Wednesday, Friday at 5pm) 60 tablet 3 03/31/2018 at 1700  . warfarin (COUMADIN) 7.5 MG tablet Take 7.5 mg  by mouth See admin instructions. Take 1 tablet (7.5 mg) by mouth every Tuesday, Thursday, Saturday and Sunday at 5pm   04/01/2018 at 1700  . Insulin Glargine (LANTUS SOLOSTAR) 100 UNIT/ML Solostar Pen Inject 10 Units into the skin daily at 10 pm. (Patient not taking: Reported on 04/02/2018) 1 pen 6 Not Taking at Unknown time  . ondansetron (ZOFRAN ODT) 4 MG disintegrating tablet Take 1 tablet (4 mg total) by mouth every 8 (eight) hours as needed for  nausea. (Patient not taking: Reported on 04/02/2018) 6 tablet 0 Not Taking at Unknown time  . traZODone (DESYREL) 50 MG tablet Take 1 tablet (50 mg total) by mouth at bedtime as needed for sleep. (Patient not taking: Reported on 04/02/2018) 30 tablet 6 Not Taking at Unknown time    Assessment: 62 yo man to continue coumadin for LVAD.  Warfarin regimen as of 6/25 at outpatient anticoag visit was 7.5 mg daily except 5 mg on Mon, Wed, Friday.   INR is 2.3 on admission. Last dose of warfarin was 6/28 as per SNF MAR? - missed dose on 6/30. INR today is 2.02, within goal range. Hgb is 9, plt 157, LDH stable s/p 2 PRBCs on 7/1. No s/sx of bleeding. On home amiodarone dose.   Goal of Therapy:  INR 1.8-2.4 Monitor platelets by anticoagulation protocol: Yes   Plan:  Order warfarin 5 mg today as per home regimen Check daily PT/INR Monitor for bleeding complications  Doylene Canard, PharmD Clinical Pharmacist  Pager: [phone] Phone: [phone] 04/05/2018,9:30 AM

## 2018-04-05 NOTE — Progress Notes (Addendum)
Advanced Heart Failure VAD Team Note  PCP-Cardiologist: Glori Bickers, MD   Subjective:    Modest UOP with -1.3 L. Weight unchanged. BMET pending.  Brisk UOP, but only net negative -550 mls. Weight down 8 lbs on IV lasix. Now 203 pounds.  Creatinine improving 2.27 > 2.10 > 1.55 > 1.46. K 3.7, Na 128  Hemoglobin stable 9.0. INR 2.02. LDH 145  Remains on dobutamine drip @ 7.5 mcg/kg/min.  Co-ox 61%. CVP 21-22  Paracentesis 04/03/18 in IR with 3 L off.   Denies CP, orthopnea. Denies bleeding. + edema and bloating. Feels weak.   LVAD INTERROGATION:  HeartMate 3 LVAD:   Flow 5.5 liters/min, speed 5800, power 5, PI 2.6. 2 PI events in 24 hours. Personally reviewed.   Objective:    Vital Signs:   Temp:  [97.5 F (36.4 C)-98.3 F (36.8 C)] 98.3 F (36.8 C) (07/03 0345) Pulse Rate:  [80-81] 80 (07/02 2346) Resp:  [13-17] 15 (07/03 0345) BP: (71-101)/(55-76) 86/64 (07/03 0345) SpO2:  [97 %-99 %] 97 % (07/02 1632) Weight:  [203 lb 7.8 oz (92.3 kg)] 203 lb 7.8 oz (92.3 kg) (07/03 0500) Last BM Date: 04/04/18 Mean arterial Pressure 76-80  Intake/Output:   Intake/Output Summary (Last 24 hours) at 04/05/2018 0753 Last data filed at 04/04/2018 2100 Gross per 24 hour  Intake 720 ml  Output 1375 ml  Net -655 ml     Physical Exam   CVP 21-22 General: NAD.  HEENT: Normal.Anicteric Neck: Supple, JVP to jaw. Carotids OK. Right tunneled PICC to chest. Mepilex to chest Cardiac:  Mechanical heart sounds with LVAD hum present.  Lungs:  CTAB, normal effort. Dull at bases Abdomen:  Obese +distension, NT, no HSM. No bruits or masses. +BS  LVAD exit site: Dressing dry and intact. Stabilization device present and accurately applied.  Extremities:  Warm and dry. No cyanosis, clubbing, rash, BLE 1-2+ edema, L>R Neuro: alert & oriented x 3, cranial nerves grossly intact. moves all 4 extremities w/o difficulty. Affect pleasant    Telemetry   AV paced 80s. Personally reviewed.   Labs    Basic Metabolic Panel: Recent Labs  Lab 04/02/18 1849 04/03/18 0606 04/04/18 0345 04/04/18 1446  NA 125* 126* 129* 128*  K 6.5* 4.6 2.9* 3.3*  CL 87* 86* 87* 87*  CO2 25 30 30 31   GLUCOSE 247* 182* 149* 176*  BUN 43* 43* 40* 35*  CREATININE 2.27* 2.10* 1.55* 1.42*  CALCIUM 9.0 8.7* 8.6* 8.7*  MG 2.2 2.1  --   --     Liver Function Tests: Recent Labs  Lab 04/02/18 1849  AST 31  ALT 18  ALKPHOS 100  BILITOT 1.1  PROT 7.3  ALBUMIN 3.4*   No results for input(s): LIPASE, AMYLASE in the last 168 hours. Recent Labs  Lab 04/02/18 1849 04/03/18 0606  AMMONIA 59* 23    CBC: Recent Labs  Lab 04/02/18 1849 04/03/18 0606 04/04/18 0345 04/05/18 0500  WBC 9.9 6.3 5.6 5.0  NEUTROABS 8.2*  --   --   --   HGB 7.5* 7.1* 8.9* 9.0*  HCT 27.3* 25.6* 30.2* 31.2*  MCV 78.9 77.3* 79.9 80.0  PLT 189 162 159 157    INR: Recent Labs  Lab 04/02/18 1849 04/03/18 0606 04/04/18 0345 04/05/18 0630  INR 2.30 2.09 2.09 2.02    Other results:  EKG:    Imaging   Ir Paracentesis  Result Date: 04/03/2018 INDICATION: Patient with history HF, LVAD, and recurrent ascites. Request is  made for therapeutic paracentesis. EXAM: ULTRASOUND GUIDED THERAPEUTIC PARACENTESIS MEDICATIONS: 10 mL of 2% lidocaine COMPLICATIONS: None immediate. PROCEDURE: Informed written consent was obtained from the patient after a discussion of the risks, benefits and alternatives to treatment. A timeout was performed prior to the initiation of the procedure. Initial ultrasound scanning demonstrates a moderate amount of ascites within the right lower abdominal quadrant. The right lower abdomen was prepped and draped in the usual sterile fashion. 1% lidocaine was used for local anesthesia. Following this, a 19 gauge, 7-cm, Yueh catheter was introduced. An ultrasound image was saved for documentation purposes. The paracentesis was performed. The catheter was removed and a dressing was applied. The patient  tolerated the procedure well without immediate post procedural complication. FINDINGS: A total of approximately 3 L of clear gold fluid was removed. IMPRESSION: Successful ultrasound-guided paracentesis yielding 3 L of peritoneal fluid. Read by: Earley Abide, PA-C Electronically Signed   By: Sandi Mariscal M.D.   On: 04/03/2018 10:03     Medications:     Scheduled Medications: . amiodarone  200 mg Oral Daily  . Chlorhexidine Gluconate Cloth  6 each Topical Q0600  . citalopram  20 mg Oral Daily  . furosemide  80 mg Intravenous BID  . gabapentin  300 mg Oral BID  . insulin aspart  5 Units Subcutaneous TID WC  . insulin glargine  10 Units Subcutaneous Q2200  . lactulose  45 g Oral TID  . levothyroxine  50 mcg Oral QAC breakfast  . magnesium oxide  400 mg Oral Daily  . midodrine  10 mg Oral TID WC  . multivitamin with minerals  1 tablet Oral Daily  . mupirocin ointment  1 application Nasal BID  . pantoprazole  40 mg Oral Daily  . potassium chloride SA  20 mEq Oral BID  . sucralfate  1 g Oral TID WC & HS  . Warfarin - Pharmacist Dosing Inpatient   Does not apply q1800    Infusions: . DOBUTamine 7.5 mcg/kg/min (04/05/18 0354)    PRN Medications: acetaminophen, acetaminophen, cyclobenzaprine, docusate sodium, lidocaine, ondansetron (ZOFRAN) IV, ondansetron (ZOFRAN) IV, senna-docusate, sodium chloride flush, traZODone   Patient Profile   Ronald Milleris a 62 y.o.male with history of chronic systolic HF s/p Medtronic ICD 2014, CAD s/p CABG in 2000, HTN, Hx of cocaine abuse, Tobacco abuse, Depression, PAF, and COPD. HM3 placed 09/09/2016. In April 2018 he was placed on milrinone but later switched to dobutamine. He remains on dobutamine 7.85mcg.   Admitted with volume overload and AKI.   Assessment/Plan:    1. Acute on Chronic end-stage biventricular systolic HF with R>>L HF: LVEF 15% due to ICM -> s/p Echo 08/24/16 LVEF 15%, RV mild dilated, moderately reduced. 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  Has been turned down for transplant at [facility].  - Echo 2/18 EF 15. Severe RV dysfunction - Complicated by RV failure. Failed milrinone due to persistent RV failure/shock..   - Chronic NYHA IIIB-IV despite VAD support. Volume status remains elevated.  - Continue dobutamine 7.5 mcg and midodrine. Coox 61%. CVP 21-22.  - Increase lasix to 80 mg TID. Start metolazone 2.5 mg BID today.  - Continue midodrine 10 TID for BP support in setting of cirrhotic physiology - Off sildenafil due to hypotension - No beta blocker with RV failure   2. Acute on chronic renal failure (CKD3 at baseline) - Baseline creatinine 1.5-1.6 - Improving with diuresis. BMET pending   3. Hyperkalemia > Hypokalemia - Kayexalate and  IV lasix given in ER. Potassium and spiro held - Creatinine 1.46  4. Hyponatremia - Will follow. Suspect he may need tolvaptan. - Free water restrict. Na 128  5. Heartmate-3 LVAD - VAD interrogated personally. Parameters stable. - INR today 2.02. Warfarin per pharmacy - LDH stable 145 - Driveline OK.  - Not transplant candidate due to social issues, noncompliance and cirrhosis (has been turned down by [facility])  6. Cirrhosis with ascites and hepatic encephalopathy - IR paracentesis 04/03/18 with 3 L off - Encephalopathy controlled with lactulose. Ammonia normal on admit  7. CAD: Severe 3v- CAD s/p CABG with occluded grafts as above except for LIMA.  - No s/s ischemia.  - Continue current therapy.   8. DMII:  - Cover with SSI. No change.   .  9. Atrial fibrillation, paroxysmal - Continue amio 200 daily. Careful with cirrhosis. No change.   10.  Anxiety/Depression - Continue Celexa 20mg  daily. No change.  - Social issues still a concern, though now long-term SNF.   11. Anemia, iron-deficient:   - Has had GI work up with EGD and Capsule Endoscopy showing severe gastropathy  - Recently received 2UPRBCS as outpatient.  - Hgb 7.5 > 7.1 > 2 uPRBC > 8.9 > 9.0. Denies  bleeding.   12. DNR/DNI  Hopefully will get back to [facility] on Friday. Will alert CSW.   I reviewed the LVAD parameters from today, and compared the results to the patient's prior recorded data.  No programming changes were made.  The LVAD is functioning within specified parameters.  The patient performs LVAD self-test daily.  LVAD interrogation was negative for any significant power changes, alarms or PI events/speed drops.  LVAD equipment check completed and is in good working order.  Back-up equipment present.   LVAD education done on emergency procedures and precautions and reviewed exit site care.  Length of Stay: [place], NP 04/05/2018, 7:53 AM  VAD Team --- VAD ISSUES ONLY--- Pager [phone] (7am - 7am)  Advanced Heart Failure Team  Pager [phone] (M-F; 7a - 4p)  Please contact [facility] for night-coverage after hours (4p -7a ) and weekends on [email or web address]  He continues to remain tenuous. On dobutamine for RV support and midodrine for BP support. Volume status back up despite IV lasix. Will increase IV lasix and add metolazone. VAD interrogated personally. Parameters stable. INR 2.02. Discussed dosing with PharmD personally. Chest wounding healing slowly. Hepatic encephalopathy under control.   Glori Bickers, MD  6:29 PM

## 2018-04-05 NOTE — Discharge Summary (Addendum)
Advanced Heart Failure Team  Discharge Summary   Patient ID: Ronald Miller. MRN: [id number], DOB/AGE: 1956-03-17 62 y.o. Admit date: 04/02/2018 D/C date:     04/07/2018   Primary Discharge Diagnoses:  1. A/C end stage biventricular systolic HF - On dobutamine 7.5 mcg/kg/min, managed by [facility] - S/p HM3 LVAD 09/09/2016 - Coumadin with INR goal 1.8-2.3. Not on ASA with hx of GIB 2. AKI on CKD 3. Hyperkalemia > Hypokalemia 4. Hyponatremia 5. Heartmate 3 LVAD 6. Cirrhosis - s/p multiple paracentesis  7. CAD 8. DMII 9. PAF 10. Anxiety/depression 11. Anemia, iron deficient 12. DNR/DNI  Hospital Course:  Ronald Milleris a 62 y.o.male with history of chronic systolic HF s/p Medtronic ICD 2014, CAD s/p CABG in 2000, HTN, Hx of cocaine abuse, Tobacco abuse, Depression, PAF, and COPD. HM3 placed 09/09/2016. In April 2018 he was placed on milrinone but later switched to dobutamine. He remains on dobutamine 7.44mcg.   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  06/01/2017- Capsule Endoscopy- normal except  tiny angiectasia, nonbleeding in the right colon.  Presented to [facility] from [facility] for worsening HF and renal failure. He was admitted due to volume overload and AKI with hyperkalemia. He required IV lasix, metolazone, and diamox for diuresis. Dobutamine continued at 7.5 mcg/kg/min. Initial co-ox low, but improved with diuresis. He diuresed 12 lbs. He underwent paracentesis on 7/1 with 3 L off and 7/5 with 0.5 L off.    Creatinine improved with diuresis. His potassium was initially elevated and he required kayexelate, but then he required potassium supplement once creatinine normalized. He received tolvaptan x1 for hyponatremia. He will need BMET on Tuesday. Digoxin was held with elevated levels during AKI. Ronald Miller initially held with AKI, but was resumed at discharge.   Feels ok today. Anxious to go back to [facility] No  orthopnea or PND. CVP 16-17. Weight 203  LVAD parameters remained stable. INR and hemoglobin monitored closely. He did not require any transfusions. Hemoglobin 8.7 on day of discharge.   He will be discharged today back to [facility]. He will be followed closely in VAD clinic, with appointment as below. He will need BMET and PT/INR on Tuesday at facility. Orders place. [facility] to manage dobutamine.  LVAD Interrogation HM II:   Speed: 5800    Flow: 5.6    PI: 2.2    Power:  5     Back-up speed:  7829     Discharge Weight: 207 lbs Discharge Vitals: Blood pressure (!) 85/76, pulse 80, temperature 98 F (36.7 C), temperature source Oral, resp. rate (!) 26, height 5\' 9"  (1.753 m), weight 207 lb 14.3 oz (94.3 kg), SpO2 95 %.  Labs: Lab Results  Component Value Date   WBC 6.8 04/07/2018   HGB 8.7 (L) 04/07/2018   HCT 29.9 (L) 04/07/2018   MCV 80.6 04/07/2018   PLT 149 (L) 04/07/2018    Recent Labs  Lab 04/02/18 1849  04/07/18 0503  NA 125*   < > 125*  K 6.5*   < > 3.0*  CL 87*   < > 84*  CO2 25   < > 32  BUN 43*   < > 30*  CREATININE 2.27*   < > 1.17  CALCIUM 9.0   < > 8.5*  PROT 7.3  --   --   BILITOT 1.1  --   --   ALKPHOS 100  --   --  ALT 18  --   --   AST 31  --   --   GLUCOSE 247*   < > 215*   < > = values in this interval not displayed.   Lab Results  Component Value Date   CHOL 153 08/15/2013   HDL 37 (L) 08/15/2013   LDLCALC 89 08/15/2013   TRIG 133 08/15/2013   BNP (last 3 results) No results for input(s): BNP in the last 8760 hours.  ProBNP (last 3 results) No results for input(s): PROBNP in the last 8760 hours.   Diagnostic Studies/Procedures   Ir Paracentesis  Result Date: 04/07/2018 INDICATION: Patient with congestive heart failure, recurrent ascites. Request is made for therapeutic paracentesis. EXAM: ULTRASOUND GUIDED THERAPEUTIC PARACENTESIS MEDICATIONS: 10 mL 2% lidocaine COMPLICATIONS: None immediate. PROCEDURE: Informed written consent was obtained  from the patient after a discussion of the risks, benefits and alternatives to treatment. A timeout was performed prior to the initiation of the procedure. Initial ultrasound scanning demonstrates a small amount of ascites within the right lower abdominal quadrant. The right lower abdomen was prepped and draped in the usual sterile fashion. 2% lidocaine was used for local anesthesia. Following this, a 19 gauge, 7-cm, Yueh catheter was introduced. An ultrasound image was saved for documentation purposes. The paracentesis was performed. The catheter was removed and a dressing was applied. The patient tolerated the procedure well without immediate post procedural complication. FINDINGS: A total of approximately 0.5 liters of yellow fluid was removed. IMPRESSION: Successful ultrasound-guided therapeutic paracentesis yielding 0.5 liters of peritoneal fluid. Read by: Brynda Greathouse PA-C Electronically Signed   By: Marybelle Killings M.D.   On: 04/07/2018 13:17    Discharge Medications   Allergies as of 04/07/2018      Reactions   Chlorhexidine Gluconate Itching, Rash   Codeine Nausea And Vomiting, Other (See Comments)   In high doses   Lipitor [atorvastatin] Nausea Only, Other (See Comments)   Nausea with high doses, tolerates 20mg  dose (08/22/16)   Tape Other (See Comments)   Paper tape is ok      Medication List    STOP taking these medications   digoxin 0.125 MG tablet Commonly known as:  DIGOX   Insulin Glargine 100 UNIT/ML Solostar Pen Commonly known as:  LANTUS SOLOSTAR     TAKE these medications   acetaminophen 500 MG tablet Commonly known as:  TYLENOL Take 1,000 mg by mouth every 6 (six) hours as needed for moderate pain.   amiodarone 200 MG tablet Commonly known as:  PACERONE TAKE ONE TABLET BY MOUTH ONCE DAILY   citalopram 20 MG tablet Commonly known as:  CELEXA Take 1 tablet (20 mg total) by mouth daily.   cyclobenzaprine 10 MG tablet Commonly known as:  FLEXERIL Take 1 tablet  (10 mg total) by mouth 3 (three) times daily as needed for muscle spasms. What changed:  when to take this   DOBUTamine 4-5 MG/ML-% infusion Commonly known as:  DOBUTREX Inject 714.75 mcg/min into the vein continuous. PER [facility] pharmacy   docusate sodium 100 MG capsule Commonly known as:  COLACE Take 100 mg by mouth at bedtime as needed (constipation).   gabapentin 300 MG capsule Commonly known as:  NEURONTIN Take 1 capsule (300 mg total) by mouth 2 (two) times daily.   insulin aspart 100 UNIT/ML FlexPen Commonly known as:  NOVOLOG FLEXPEN Inject 5 Units into the skin 3 (three) times daily with meals.   lactulose 10 GM/15ML solution Commonly known as:  CHRONULAC Take 67.5 mLs (45 g total) by mouth 3 (three) times daily.   LEVEMIR FLEXTOUCH 100 UNIT/ML Pen Generic drug:  Insulin Detemir Inject 10 Units into the skin at bedtime.   levothyroxine 50 MCG tablet Commonly known as:  SYNTHROID, LEVOTHROID Take 1 tablet (50 mcg total) by mouth daily before breakfast.   magnesium oxide 400 (241.3 Mg) MG tablet Commonly known as:  MAG-OX Take 1 tablet (400 mg total) by mouth daily.   metolazone 2.5 MG tablet Commonly known as:  ZAROXOLYN Take 1 tablet (2.5 mg total) by mouth every other day. Start taking on:  04/08/2018 What changed:  when to take this   midodrine 10 MG tablet Commonly known as:  PROAMATINE Take 1 tablet (10 mg total) by mouth 3 (three) times daily with meals.   MINERIN Crea Apply 1 application topically See admin instructions. Apply to legs daily for dry skin   multivitamin with minerals Tabs tablet Take 1 tablet by mouth daily. Thera-M   ondansetron 4 MG disintegrating tablet Commonly known as:  ZOFRAN ODT Take 1 tablet (4 mg total) by mouth every 8 (eight) hours as needed for nausea.   ondansetron 4 MG tablet Commonly known as:  ZOFRAN Take 4 mg by mouth every 8 (eight) hours as needed for nausea or vomiting.   OXYGEN Inhale 2 L into the lungs as  needed (for SATS below 90%).   pantoprazole 40 MG tablet Commonly known as:  PROTONIX TAKE 1 TABLET BY MOUTH TWICE DAILY WITH A MEAL   potassium chloride SA 20 MEQ tablet Commonly known as:  K-DUR,KLOR-CON Take 2 tablets (40 mEq total) by mouth 2 (two) times daily. What changed:    how much to take  additional instructions   senna-docusate 8.6-50 MG tablet Commonly known as:  SENOKOT S Take 2 tablets by mouth at bedtime as needed for mild constipation.   spironolactone 25 MG tablet Commonly known as:  ALDACTONE Take 1 tablet (25 mg total) by mouth daily.   sucralfate 1 GM/10ML suspension Commonly known as:  CARAFATE Take 10 mLs (1 g total) by mouth 4 (four) times daily -  with meals and at bedtime.   torsemide 100 MG tablet Commonly known as:  DEMADEX Take 1 tablet (100 mg total) by mouth 2 (two) times daily. Take 100mg  in the morning and 50mg  in the evening What changed:  additional instructions   traZODone 50 MG tablet Commonly known as:  DESYREL Take 1 tablet (50 mg total) by mouth at bedtime as needed for sleep.   Vitamin D3 3000 units Tabs Take 3,000 Units by mouth daily.   warfarin 5 MG tablet Commonly known as:  COUMADIN Take as directed. If you are unsure how to take this medication, talk to your nurse or doctor. Original instructions:  Take 5mg  (1tab) Monday, Wednesday and Friday, take 7.5mg  (1.5tab) Tuesday, Thursday, Saturday and Sunday What changed:    how much to take  how to take this  when to take this  additional instructions  Another medication with the same name was removed. Continue taking this medication, and follow the directions you see here.       Disposition   The patient will be discharged in stable condition to SNF. Discharge Instructions    (HEART FAILURE PATIENTS) Call MD:  Anytime you have any of the following symptoms: 1) 3 pound weight gain in 24 hours or 5 pounds in 1 week 2) shortness of breath, with or without a dry  hacking cough 3) swelling in the hands, feet or stomach 4) if you have to sleep on extra pillows at night in order to breathe.   Complete by:  As directed    Call MD for:  redness, tenderness, or signs of infection (pain, swelling, redness, odor or green/yellow discharge around incision site)   Complete by:  As directed    Diet - low sodium heart healthy   Complete by:  As directed    Heart Failure patients record your daily weight using the same scale at the same time of day   Complete by:  As directed    INR  Goal: 1.8 - 2.3   Complete by:  As directed    Goal:  1.8 - 2.3   Increase activity slowly   Complete by:  As directed    Page VAD Coordinator at [phone]  Notify for: any VAD alarms, sustained elevations of power >10 watts, sustained drop in Pulse Index <3   Complete by:  As directed    Notify for:   any VAD alarms sustained elevations of power >10 watts sustained drop in Pulse Index <3     Speed Settings:   Complete by:  As directed    Fixed 5800 RPM Low 5500 RPM     Follow-up Information    [facility] Follow up on 04/13/2018.   Specialty:  Cardiology Why:  VAD clinic 1 pm Contact information: [address] [id number] [place] [phone]            Duration of Discharge Encounter: Greater than 35 minutes   Signed, Glori Bickers, MD  8:11 AM

## 2018-04-05 NOTE — Progress Notes (Signed)
LVAD Coordinator Rounding Note:  Admitted 04/02/18 to Dr. Haroldine Laws due to ascites and CHF with fluid overload.   HM III LVAD implanted on 09/09/16 by Dr. Cyndia Bent under Destination Therapy criteria.  Pt lying in bed, sleeping.   Vital signs: Temp:  97.9 HR: 87 Doppler Pressure:  78 Automatic BP:  98/76 (84) O2 Sat: 96 % on RA Wt: 215>211>203>203 lbs  LVAD interrogation reveals:  Speed: 5800 Flow: 5.5 Power: 4.4 w PI:  2.2 Alarms: none Events: 2 Pi events Hct: 28 Fixed speed:  5800 Low speed limit: 5500  Drive Line: left abdominal sorbaview dressing dry and intact; anchor intact and accurately applied.Next dressing change due 04/11/18. Bedside nurse may change dressing.   Labs:  LDH trend: [phone]  INR trend: 2.30>2.09>2.09>2.02  Gtts: Dobutamine 7.5 mcg/kg/min  Anticoagulation Plan: -INR Goal:  1.8 - 2.3 -ASA Dose: no ASA (history of GI bleeding)  Device: -Protect; end of service 08/2016 -Therapies:  Off Last check: complete interrogation 05/26/2017. DDD (lower rate 60); BiV pacing with 96% AS-VP  Adverse Events on VAD: 11/30/16> Milrinone gtt at discharge 01/27/17>dobutamine at 5 mcg/kg/mingtt at discharge 03/08/17> RV failure 04/29/17> symptomatic anemia and melena. EGD revealed stomach ulcers. Carafate added; ASA stopped 05/31/17>Ongoing symptomatic anemia and melena; capsule study mild antral gastritis; poss tiny AVM right colon. Lower MAPS, Losartan cut back. 06/2017> admit with syncope, AMS, Dobutamine at 7.77mcg 11/09/17>Marked ascites, RV failure, and hepatic encephalopathy. Paracentesis on 2/7 and 2/12 with >11 liters fluid removed 12/27/17>Outpatient paracentesis with 2.8 liters removed 01/11/18>IP paracentesis with 4.8 liters removed 01/16/18>IP paracentesis w/2.4L removed 02/15/18>OP paracentesis w/ 5 L removed 02/23/18>IP paracentesis w/ 5.6 L removed   Plan/Recommendations:  1. Will need weekly dressing changes; due 04/11/18. Bedside RN may change  dressing w/above instructions. 2. Please call VAD pager with any VAD equipment or drive line issues.  Zada Girt RN, VAD Coordiantor 24/7 VAD pager: [phone]

## 2018-04-05 NOTE — Plan of Care (Signed)

## 2018-04-06 LAB — BASIC METABOLIC PANEL
ANION GAP: 10 (ref 5–15)
BUN: 31 mg/dL — ABNORMAL HIGH (ref 8–23)
CO2: 34 mmol/L — AB (ref 22–32)
Calcium: 8.8 mg/dL — ABNORMAL LOW (ref 8.9–10.3)
Chloride: 86 mmol/L — ABNORMAL LOW (ref 98–111)
Creatinine, Ser: 1.32 mg/dL — ABNORMAL HIGH (ref 0.61–1.24)
GFR calc Af Amer: >60 mL/min (ref >60)
GFR calc non Af Amer: 57 mL/min — ABNORMAL LOW (ref >60)
GLUCOSE: 188 mg/dL — AB (ref 70–99)
POTASSIUM: 3.6 mmol/L (ref 3.5–5.1)
Sodium: 130 mmol/L — ABNORMAL LOW (ref 135–145)

## 2018-04-06 LAB — COOXEMETRY PANEL
CARBOXYHEMOGLOBIN: 2.5 % — AB (ref 0.5–1.5)
METHEMOGLOBIN: 1 % (ref 0.0–1.5)
O2 Saturation: 59 %
Total hemoglobin: 8.5 g/dL — ABNORMAL LOW (ref 12.0–16.0)

## 2018-04-06 LAB — CBC
HEMATOCRIT: 31.4 % — AB (ref 39.0–52.0)
Hemoglobin: 9 g/dL — ABNORMAL LOW (ref 13.0–17.0)
MCH: 23 pg — ABNORMAL LOW (ref 26.0–34.0)
MCHC: 28.7 g/dL — ABNORMAL LOW (ref 30.0–36.0)
MCV: 80.3 fL (ref 78.0–100.0)
Platelets: 156 10*3/uL (ref 150–400)
RBC: 3.91 MIL/uL — AB (ref 4.22–5.81)
RDW: 19.6 % — ABNORMAL HIGH (ref 11.5–15.5)
WBC: 5.7 10*3/uL (ref 4.0–10.5)

## 2018-04-06 LAB — LACTATE DEHYDROGENASE: LDH: 133 U/L (ref 98–192)

## 2018-04-06 LAB — PROTIME-INR
INR: 2.12
Prothrombin Time: 23.6 seconds — ABNORMAL HIGH (ref 11.4–15.2)

## 2018-04-06 LAB — GLUCOSE, CAPILLARY
GLUCOSE-CAPILLARY: 160 mg/dL — AB (ref 70–99)
GLUCOSE-CAPILLARY: 133 mg/dL — AB (ref 70–99)
GLUCOSE-CAPILLARY: 171 mg/dL — AB (ref 70–99)
Glucose-Capillary: 170 mg/dL — ABNORMAL HIGH (ref 70–99)

## 2018-04-06 MED ORDER — WARFARIN SODIUM 7.5 MG PO TABS
7.5000 mg | ORAL_TABLET | ORAL | Status: DC
  Administered 2018-04-06: 7.5 mg via ORAL
  Filled 2018-04-06 (×2): qty 1

## 2018-04-06 MED ORDER — WARFARIN SODIUM 5 MG PO TABS
5.0000 mg | ORAL_TABLET | ORAL | Status: DC
  Administered 2018-04-07: 5 mg via ORAL
  Filled 2018-04-06: qty 1

## 2018-04-06 MED ORDER — ACETAZOLAMIDE 250 MG PO TABS
500.0000 mg | ORAL_TABLET | Freq: Two times a day (BID) | ORAL | Status: DC
  Administered 2018-04-06 – 2018-04-08 (×5): 500 mg via ORAL
  Filled 2018-04-06 (×5): qty 2

## 2018-04-06 NOTE — Progress Notes (Signed)
Advanced Heart Failure VAD Team Note  PCP-Cardiologist: Glori Bickers, MD   Subjective:     Paracentesis 04/03/18 in IR with 3 L off.   Remains on dobutamine drip @ 7.5 mcg/kg/min.  Co-ox 59%. CVP 14-15 Weight stable 203-204  Says he feels good. Wants to go back to [facility]. Denies CP, orthopnea or PND.    LVAD INTERROGATION:  HeartMate 3 LVAD:   Flow 5.5 liters/min, speed 5800, power 5.0, PI 2.4. Discussed dosing with PharmD personally.   Objective:    Vital Signs:   Temp:  [97.6 F (36.4 C)-97.9 F (36.6 C)] 97.8 F (36.6 C) (07/04 0730) Pulse Rate:  [77-83] 77 (07/04 0730) Resp:  [14-19] 17 (07/04 0730) BP: (77-125)/(63-90) 93/73 (07/04 0730) SpO2:  [90 %-97 %] 97 % (07/04 0730) Weight:  [92.8 kg (204 lb 9.4 oz)] 92.8 kg (204 lb 9.4 oz) (07/04 0318) Last BM Date: 04/05/18 Mean arterial Pressure 70-80s  Intake/Output:   Intake/Output Summary (Last 24 hours) at 04/06/2018 1055 Last data filed at 04/06/2018 0926 Gross per 24 hour  Intake 958.97 ml  Output 1650 ml  Net -691.03 ml     Physical Exam   General:  NAD. Sitting on side bed.  HEENT: normal  Neck: supple. JVPto jaw .  Carotids 2+ bilat; no bruits. No lymphadenopathy or thryomegaly appreciated. Cor: LVAD hum.  Lungs: Clear. Abdomen: obese soft, nontender, non-distended. No hepatosplenomegaly. No bruits or masses. Good bowel sounds. Driveline site clean. Anchor in place.  Extremities: no cyanosis, clubbing, rash. trace edema  Neuro: alert & oriented x 3. No focal deficits. Moves all 4 without problem     Telemetry   AV paced 80. Personally reviewed.   Labs   Basic Metabolic Panel: Recent Labs  Lab 04/02/18 1849 04/03/18 0606 04/04/18 0345 04/04/18 1446 04/05/18 0500 04/05/18 1400 04/06/18 0304  NA 125* 126* 129* 128* 128* 130* 130*  K 6.5* 4.6 2.9* 3.3* 3.7 3.6 3.6  CL 87* 86* 87* 87* 88* 89* 86*  CO2 25 30 30 31  32 33* 34*  GLUCOSE 247* 182* 149* 176* 157* 170* 188*  BUN 43* 43*  40* 35* 33* 31* 31*  CREATININE 2.27* 2.10* 1.55* 1.42* 1.46* 1.35* 1.32*  CALCIUM 9.0 8.7* 8.6* 8.7* 8.6* 8.6* 8.8*  MG 2.2 2.1  --   --   --   --   --     Liver Function Tests: Recent Labs  Lab 04/02/18 1849  AST 31  ALT 18  ALKPHOS 100  BILITOT 1.1  PROT 7.3  ALBUMIN 3.4*   No results for input(s): LIPASE, AMYLASE in the last 168 hours. Recent Labs  Lab 04/02/18 1849 04/03/18 0606  AMMONIA 59* 23    CBC: Recent Labs  Lab 04/02/18 1849 04/03/18 0606 04/04/18 0345 04/05/18 0500 04/06/18 0304  WBC 9.9 6.3 5.6 5.0 5.7  NEUTROABS 8.2*  --   --   --   --   HGB 7.5* 7.1* 8.9* 9.0* 9.0*  HCT 27.3* 25.6* 30.2* 31.2* 31.4*  MCV 78.9 77.3* 79.9 80.0 80.3  PLT 189 162 159 157 156    INR: Recent Labs  Lab 04/02/18 1849 04/03/18 0606 04/04/18 0345 04/05/18 0630 04/06/18 0304  INR 2.30 2.09 2.09 2.02 2.12    Other results:  EKG:    Imaging   No results found.   Medications:     Scheduled Medications: . amiodarone  200 mg Oral Daily  . Chlorhexidine Gluconate Cloth  6 each Topical Q0600  .  citalopram  20 mg Oral Daily  . furosemide  80 mg Intravenous TID  . gabapentin  300 mg Oral BID  . insulin aspart  5 Units Subcutaneous TID WC  . insulin glargine  10 Units Subcutaneous Q2200  . lactulose  45 g Oral TID  . levothyroxine  50 mcg Oral QAC breakfast  . magnesium oxide  400 mg Oral Daily  . metolazone  2.5 mg Oral BID  . midodrine  10 mg Oral TID WC  . multivitamin with minerals  1 tablet Oral Daily  . mupirocin ointment  1 application Nasal BID  . pantoprazole  40 mg Oral Daily  . potassium chloride SA  40 mEq Oral BID  . sucralfate  1 g Oral TID WC & HS  . [START ON 04/07/2018] warfarin  5 mg Oral Q M,W,F-1800  . warfarin  7.5 mg Oral Q T,Th,S,Su-1800  . Warfarin - Pharmacist Dosing Inpatient   Does not apply q1800    Infusions: . DOBUTamine 7.5 mcg/kg/min (04/06/18 0451)    PRN Medications: acetaminophen, acetaminophen, cyclobenzaprine,  docusate sodium, lidocaine, ondansetron (ZOFRAN) IV, ondansetron (ZOFRAN) IV, senna-docusate, sodium chloride flush, traZODone   Patient Profile   Ronald Milleris a 62 y.o.male with history of chronic systolic HF s/p Med[employer]onic ICD 2014, CAD s/p CABG in 2000, HTN, Hx of cocaine abuse, Tobacco abuse, Depression, PAF, and COPD. HM3 placed 09/09/2016. In April 2018 he was placed on milrinone but later switched to dobutamine. He remains on dobutamine 7.10mcg.   Admitted with volume overload and AKI.   Assessment/Plan:    1. Acute on Chronic end-stage biventricular systolic HF with R>>L HF: LVEF 15% due to ICM -> s/p Echo 08/24/16 LVEF 15%, RV mild dilated, moderately reduced. -->S/P HM3 LVAD 09/09/2016.  Has been turned down for [facility].  - Echo 2/18 EF 15. Severe RV dysfunction - Complicated by RV failure. Failed milrinone due to persistent RV failure/shock..   - Chronic NYHA IIIB-IV despite VAD support. Volume status improving but still about 10 pounds over baseline weight - Continue dobutamine 7.5 mcg and midodrine. Coox 59%. CVP 14-15 - Continue IV lasix to 80 mg TID and metolazone 2.5 mg BID today.  Add diamox 500 bid - Continue midodrine 10 TID for BP support in setting of cirrhotic physiology - Off sildenafil due to hypotension - No beta blocker with RV failure   2. Acute on chronic renal failure (CKD3 at baseline) - Baseline creatinine 1.5-1.6 - Improving with diuresis. Creatinine 1.3 today  3. Hyperkalemia > Hypokalemia - Kayexalate and IV lasix given in ER. Potassium and spiro held - K 3.6 now. Wil supp   4. Hyponatremia - Improved to 130. Continue free water restriction.   5. Heartmate-3 LVAD - VAD interrogated personally. Parameters stable. - INR today 2.12. Discussed dosing with PharmD personally. - LDH stable 133 - Driveline OK.  - Not transplant candidate due to social issues, noncompliance and cirrhosis (has been turned down by [facility])  6. Cirrhosis  with ascites and hepatic encephalopathy - IR paracentesis 04/03/18 with 3 L off - Encephalopathy controlled with lactulose. Ammonia normal on admit  7. CAD: Severe 3v- CAD s/p CABG with occluded grafts as above except for LIMA.  - No s/s ischemia.  - Continue current therapy.   8. DMII:  - Cover with SSI. No change.   .  9. Atrial fibrillation, paroxysmal - Continue amio 200 daily. Careful with cirrhosis. No change.   10.  Anxiety/Depression - Continue  Celexa 20mg  daily. No change.  - Social issues still a concern, though now long-term SNF.   11. Anemia, iron-deficient:   - Has had GI work up with EGD and Capsule Endoscopy showing severe gastropathy  - Recently received 2UPRBCS as outpatient.  - Hgb 7.5 > 7.1 > 2 uPRBC > 8.9 > 9.0. Denies bleeding.   12. DNR/DNI  Hopefully will get back to [facility] tomorrow. CSW aware  I reviewed the LVAD parameters from today, and compared the results to the patient's prior recorded data.  No programming changes were made.  The LVAD is functioning within specified parameters.  The patient performs LVAD self-test daily.  LVAD interrogation was negative for any significant power changes, alarms or PI events/speed drops.  LVAD equipment check completed and is in good working order.  Back-up equipment present.   LVAD education done on emergency procedures and precautions and reviewed exit site care.  Length of Stay: [place], MD 04/06/2018, 10:55 AM  VAD Team --- VAD ISSUES ONLY--- Pager [phone] (7am - 7am)  Advanced Heart Failure Team  Pager [phone] (M-F; 7a - 4p)  Please contact [facility] for night-coverage after hours (4p -7a ) and weekends on [email or web address]

## 2018-04-06 NOTE — Plan of Care (Signed)
  Problem: Safety: Goal: Ability to remain free from injury will improve Outcome: Progressing   

## 2018-04-06 NOTE — Progress Notes (Signed)
[facility] for coumadin Indication: LVAD  Allergies  Allergen Reactions  . Chlorhexidine Gluconate Itching and Rash  . Codeine Nausea And Vomiting and Other (See Comments)    In high doses  . Lipitor [Atorvastatin] Nausea Only and Other (See Comments)    Nausea with high doses, tolerates 20mg  dose (08/22/16)  . Tape Other (See Comments)    Paper tape is ok    Patient Measurements: Height: 5\' 9"  (175.3 cm) Weight: 204 lb 9.4 oz (92.8 kg) IBW/kg (Calculated) : 70.7   Vital Signs: Temp: 97.6 F (36.4 C) (07/03 2325) Temp Source: Oral (07/03 2325) BP: 125/90 (07/04 0318) Pulse Rate: 83 (07/04 0318)  Labs: Recent Labs    04/04/18 0345  04/05/18 0500 04/05/18 0630 04/05/18 1400 04/06/18 0304  HGB 8.9*  --  9.0*  --   --  9.0*  HCT 30.2*  --  31.2*  --   --  31.4*  PLT 159  --  157  --   --  156  LABPROT 23.3*  --   --  22.7*  --  23.6*  INR 2.09  --   --  2.02  --  2.12  CREATININE 1.55*   < > 1.46*  --  1.35* 1.32*   < > = values in this interval not displayed.    Estimated Creatinine Clearance: 66.1 mL/min (A) (by C-G formula based on SCr of 1.32 mg/dL (H)).   Medical History: Past Medical History:  Diagnosis Date  . AICD (automatic cardioverter/defibrillator) present   . Anxiety   . ASCVD (arteriosclerotic cardiovascular disease)   . Chronic systolic CHF (congestive heart failure) ([place])   . COPD (chronic obstructive pulmonary disease) ([place])   . Coronary artery disease   . Depression   . GI bleed   . High cholesterol   . History of blood transfusion   . History of cocaine abuse   . Hypertension   . LVAD (left ventricular assist device) present ([place])   . Presence of permanent cardiac pacemaker   . Shortness of breath dyspnea   . Suicidal ideation   . Tobacco abuse   . Type II diabetes mellitus (H[facility])     Medications:  Medications Prior to Admission  Medication Sig Dispense Refill Last Dose  . acetaminophen  (TYLENOL) 500 MG tablet Take 1,000 mg by mouth every 6 (six) hours as needed for moderate pain.    unknown  . amiodarone (PACERONE) 200 MG tablet TAKE ONE TABLET BY MOUTH ONCE DAILY 60 tablet 6 04/02/2018 at 800  . Cholecalciferol (VITAMIN D3) 3000 units TABS Take 3,000 Units by mouth daily.   04/02/2018 at 800  . citalopram (CELEXA) 20 MG tablet Take 1 tablet (20 mg total) by mouth daily. 30 tablet 6 04/02/2018 at 800  . cyclobenzaprine (FLEXERIL) 10 MG tablet Take 1 tablet (10 mg total) by mouth 3 (three) times daily as needed for muscle spasms. (Patient taking differently: Take 10 mg by mouth every 8 (eight) hours as needed for muscle spasms. ) 30 tablet 1 03/18/2018 at 2158  . digoxin (DIGOX) 0.125 MG tablet Take 1 tablet (0.125 mg total) daily by mouth. 30 tablet 6 04/02/2018 at 800  . DOBUTamine (DOBUTREX) 4-5 MG/ML-% infusion Inject 714.75 mcg/min into the vein continuous. PER [facility] pharmacy 250 mL 11 04/02/2018 at 1430  . docusate sodium (COLACE) 100 MG capsule Take 100 mg by mouth at bedtime as needed (constipation).    unknown  . gabapentin (NEURONTIN) 300  MG capsule Take 1 capsule (300 mg total) by mouth 2 (two) times daily. 180 capsule 6 04/02/2018 at 800  . insulin aspart (NOVOLOG FLEXPEN) 100 UNIT/ML FlexPen Inject 5 Units into the skin 3 (three) times daily with meals. 15 mL 11 04/02/2018 at 1200  . Insulin Detemir (LEVEMIR FLEXTOUCH) 100 UNIT/ML Pen Inject 10 Units into the skin at bedtime.   04/01/2018 at 2200  . lactulose (CHRONULAC) 10 GM/15ML solution Take 67.5 mLs (45 g total) by mouth 3 (three) times daily. 240 mL 6 04/02/2018 at 1300  . levothyroxine (SYNTHROID, LEVOTHROID) 50 MCG tablet Take 1 tablet (50 mcg total) by mouth daily before breakfast. 30 tablet 6 04/02/2018 at 630  . magnesium oxide (MAG-OX) 400 (241.3 Mg) MG tablet Take 1 tablet (400 mg total) by mouth daily. 30 tablet 6 04/02/2018 at 800  . metolazone (ZAROXOLYN) 2.5 MG tablet Take 2.5 mg by mouth daily.    04/02/2018 at 900  .  midodrine (PROAMATINE) 10 MG tablet Take 1 tablet (10 mg total) by mouth 3 (three) times daily with meals. 90 tablet 6 04/02/2018 at 1200  . Multiple Vitamin (MULTIVITAMIN WITH MINERALS) TABS tablet Take 1 tablet by mouth daily. Thera-M   04/02/2018 at 800  . ondansetron (ZOFRAN) 4 MG tablet Take 4 mg by mouth every 8 (eight) hours as needed for nausea or vomiting.   04/02/2018 at 649  . OXYGEN Inhale 2 L into the lungs as needed (for SATS below 90%).   unknown  . pantoprazole (PROTONIX) 40 MG tablet TAKE 1 TABLET BY MOUTH TWICE DAILY WITH A MEAL 60 tablet 6 04/02/2018 at 800  . potassium chloride SA (K-DUR,KLOR-CON) 20 MEQ tablet Take 4 tablets (80 mEq total) by mouth 2 (two) times daily. Take 40 meq in am and 20 meq in pm (Patient taking differently: Take 80 mEq by mouth 2 (two) times daily. ) 120 tablet 3 04/02/2018 at 900  . senna-docusate (SENOKOT S) 8.6-50 MG tablet Take 2 tablets by mouth at bedtime as needed for mild constipation. 30 tablet 6 unknown  . Skin Protectants, Misc. (MINERIN) CREA Apply 1 application topically See admin instructions. Apply to legs daily for dry skin   04/02/2018 at 1430  . spironolactone (ALDACTONE) 25 MG tablet Take 1 tablet (25 mg total) by mouth daily. 30 tablet 6 04/02/2018 at 800  . sucralfate (CARAFATE) 1 GM/10ML suspension Take 10 mLs (1 g total) by mouth 4 (four) times daily -  with meals and at bedtime. 420 mL 6 04/02/2018 at 1200  . torsemide (DEMADEX) 100 MG tablet Take 1 tablet (100 mg total) by mouth 2 (two) times daily. Take 100mg  in the morning and 50mg  in the evening (Patient taking differently: Take 100 mg by mouth 2 (two) times daily. 8am and 4pm)   04/02/2018 at 800  . warfarin (COUMADIN) 5 MG tablet Take 5mg  (1tab) Monday, Wednesday and Friday, take 7.5mg  (1.5tab) Tuesday, Thursday, Saturday and Sunday (Patient taking differently: Take 5 mg by mouth See admin instructions. New order 03/15/18: take 1 tablet (5 mg) by mouth Monday, Wednesday, Friday at 5pm) 60  tablet 3 03/31/2018 at 1700  . warfarin (COUMADIN) 7.5 MG tablet Take 7.5 mg by mouth See admin instructions. Take 1 tablet (7.5 mg) by mouth every Tuesday, Thursday, Saturday and Sunday at 5pm   04/01/2018 at 1700  . Insulin Glargine (LANTUS SOLOSTAR) 100 UNIT/ML Solostar Pen Inject 10 Units into the skin daily at 10 pm. (Patient not taking: Reported on 04/02/2018) 1  pen 6 Not Taking at Unknown time  . ondansetron (ZOFRAN ODT) 4 MG disintegrating tablet Take 1 tablet (4 mg total) by mouth every 8 (eight) hours as needed for nausea. (Patient not taking: Reported on 04/02/2018) 6 tablet 0 Not Taking at Unknown time  . traZODone (DESYREL) 50 MG tablet Take 1 tablet (50 mg total) by mouth at bedtime as needed for sleep. (Patient not taking: Reported on 04/02/2018) 30 tablet 6 Not Taking at Unknown time    Assessment: 62 yo man to continue coumadin for LVAD.  Warfarin regimen as of 6/25 at outpatient anticoag visit was 7.5 mg daily except 5 mg on Mon, Wed, Friday.   INR is 2.3 on admission. Last dose of warfarin was 6/28 as per SNF MAR? - missed dose on 6/30. INR today is 2.12, within goal range. Hgb is 9, plt 157, LDH stable s/p 2 PRBCs on 7/1. No s/sx of bleeding. On home amiodarone dose.   Goal of Therapy:  INR 1.8-2.4 Monitor platelets by anticoagulation protocol: Yes   Plan:   warfarin per home regimen 5mg  MWF/7.5mg  TTSS Check daily PT/INR Monitor for bleeding complications  Bonnita Nasuti Pharm.D. CPP, BCPS Clinical Pharmacist [phone] 04/06/2018 7:21 AM

## 2018-04-07 ENCOUNTER — Inpatient Hospital Stay (HOSPITAL_COMMUNITY): Payer: Medicare HMO

## 2018-04-07 ENCOUNTER — Encounter (HOSPITAL_COMMUNITY): Payer: Self-pay | Admitting: Student

## 2018-04-07 HISTORY — PX: IR PARACENTESIS: IMG2679

## 2018-04-07 LAB — GLUCOSE, CAPILLARY
GLUCOSE-CAPILLARY: 192 mg/dL — AB (ref 70–99)
GLUCOSE-CAPILLARY: 166 mg/dL — AB (ref 70–99)
Glucose-Capillary: 164 mg/dL — ABNORMAL HIGH (ref 70–99)
Glucose-Capillary: 182 mg/dL — ABNORMAL HIGH (ref 70–99)

## 2018-04-07 LAB — CBC
HEMATOCRIT: 29.9 % — AB (ref 39.0–52.0)
HEMOGLOBIN: 8.7 g/dL — AB (ref 13.0–17.0)
MCH: 23.5 pg — AB (ref 26.0–34.0)
MCHC: 29.1 g/dL — AB (ref 30.0–36.0)
MCV: 80.6 fL (ref 78.0–100.0)
Platelets: 149 10*3/uL — ABNORMAL LOW (ref 150–400)
RBC: 3.71 MIL/uL — ABNORMAL LOW (ref 4.22–5.81)
RDW: 19.5 % — ABNORMAL HIGH (ref 11.5–15.5)
WBC: 6.8 10*3/uL (ref 4.0–10.5)

## 2018-04-07 LAB — BASIC METABOLIC PANEL
Anion gap: 9 (ref 5–15)
BUN: 30 mg/dL — AB (ref 8–23)
CHLORIDE: 84 mmol/L — AB (ref 98–111)
CO2: 32 mmol/L (ref 22–32)
CREATININE: 1.17 mg/dL (ref 0.61–1.24)
Calcium: 8.5 mg/dL — ABNORMAL LOW (ref 8.9–10.3)
GFR calc non Af Amer: >60 mL/min (ref >60)
Glucose, Bld: 215 mg/dL — ABNORMAL HIGH (ref 70–99)
Potassium: 3 mmol/L — ABNORMAL LOW (ref 3.5–5.1)
Sodium: 125 mmol/L — ABNORMAL LOW (ref 135–145)

## 2018-04-07 LAB — COOXEMETRY PANEL
Carboxyhemoglobin: 2.1 % — ABNORMAL HIGH (ref 0.5–1.5)
Methemoglobin: 1.6 % — ABNORMAL HIGH (ref 0.0–1.5)
O2 SAT: 61.2 %
Total hemoglobin: 8.8 g/dL — ABNORMAL LOW (ref 12.0–16.0)

## 2018-04-07 LAB — SODIUM: SODIUM: 130 mmol/L — AB (ref 135–145)

## 2018-04-07 LAB — LACTATE DEHYDROGENASE: LDH: 129 U/L (ref 98–192)

## 2018-04-07 LAB — PROTIME-INR
INR: 2.29
Prothrombin Time: 25 seconds — ABNORMAL HIGH (ref 11.4–15.2)

## 2018-04-07 IMAGING — DX DG CHEST 2V
2 series · 2 of 2 positions shown · non-contrast
Comparison: October 13, 2016

CLINICAL DATA: Recent pleural effusion.  Shortness of Breath

EXAM:
CHEST  2 VIEW

[w chest pa]
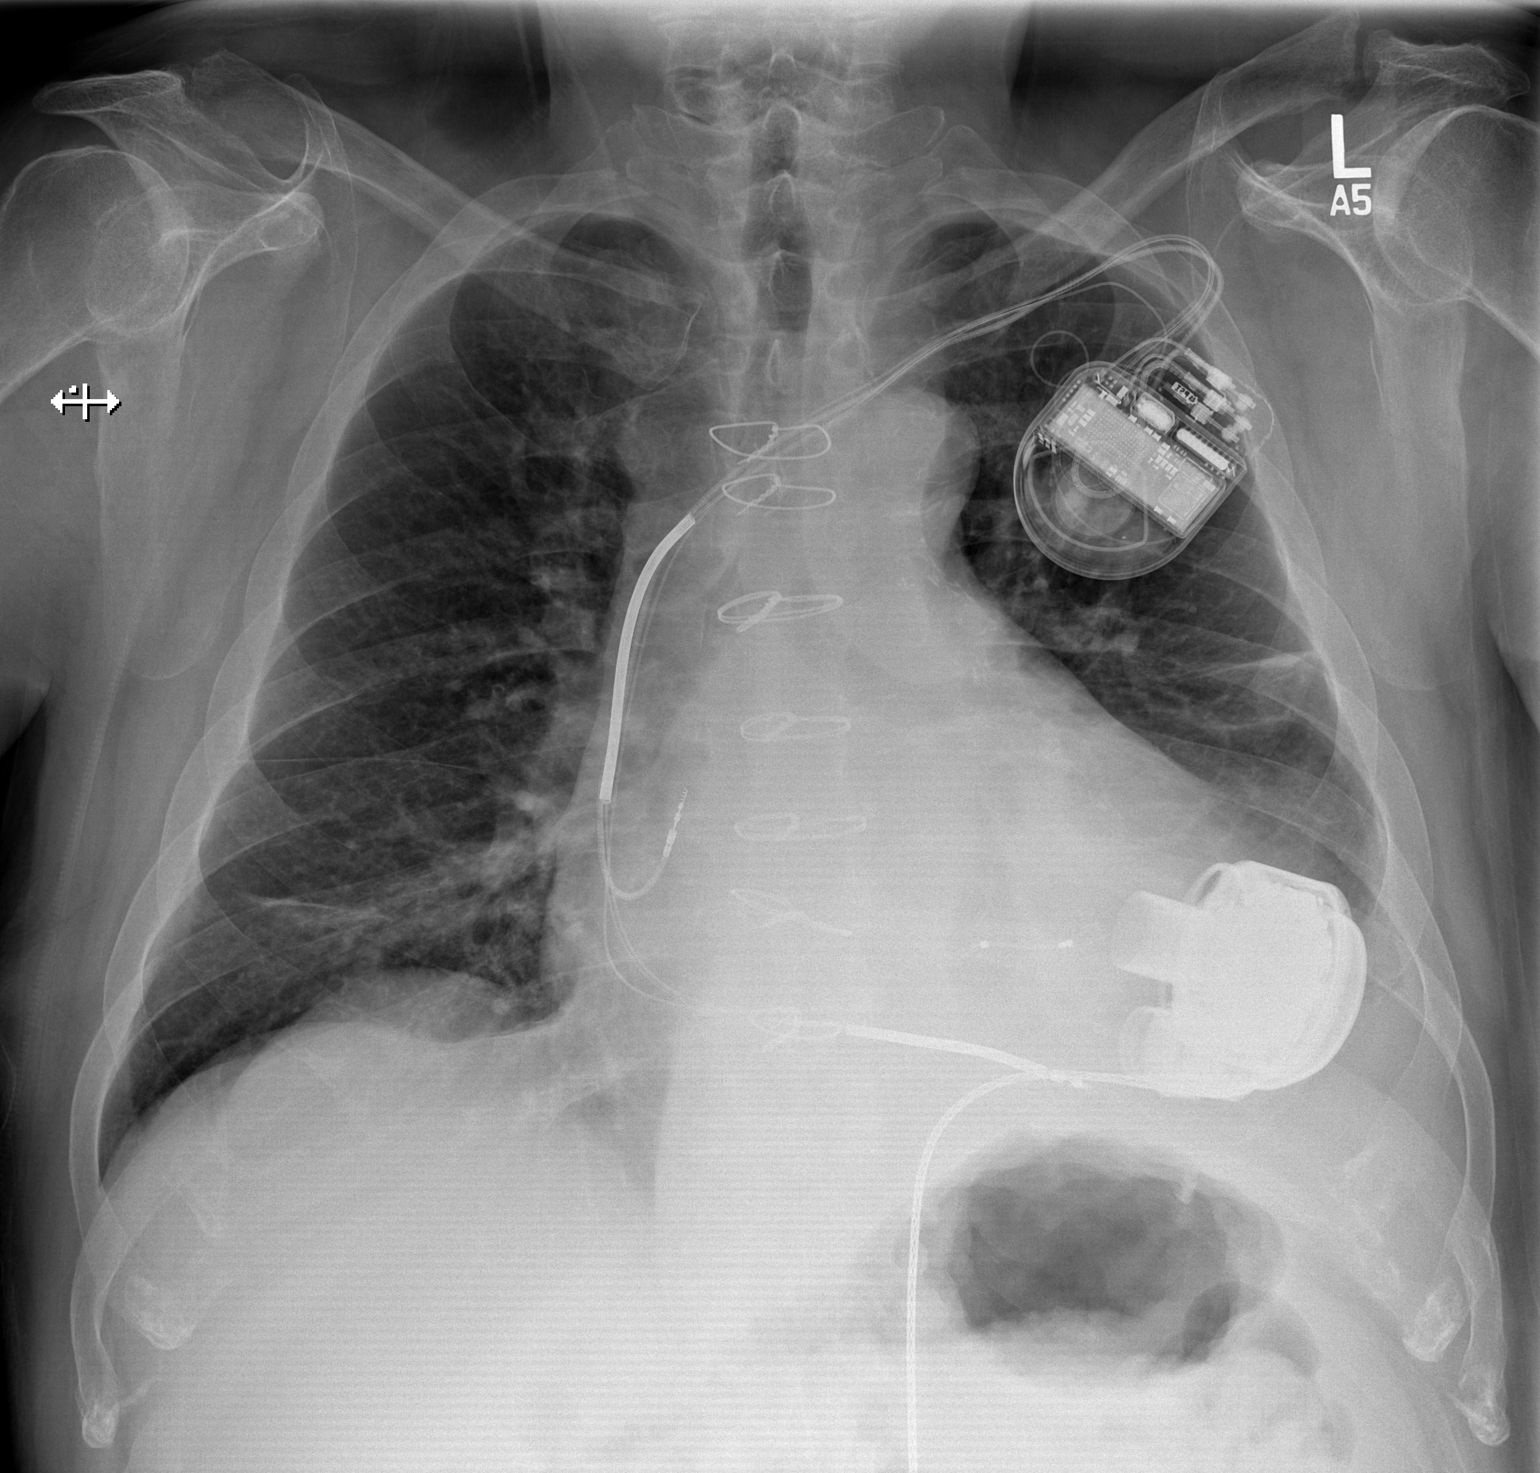

[w chest lat]
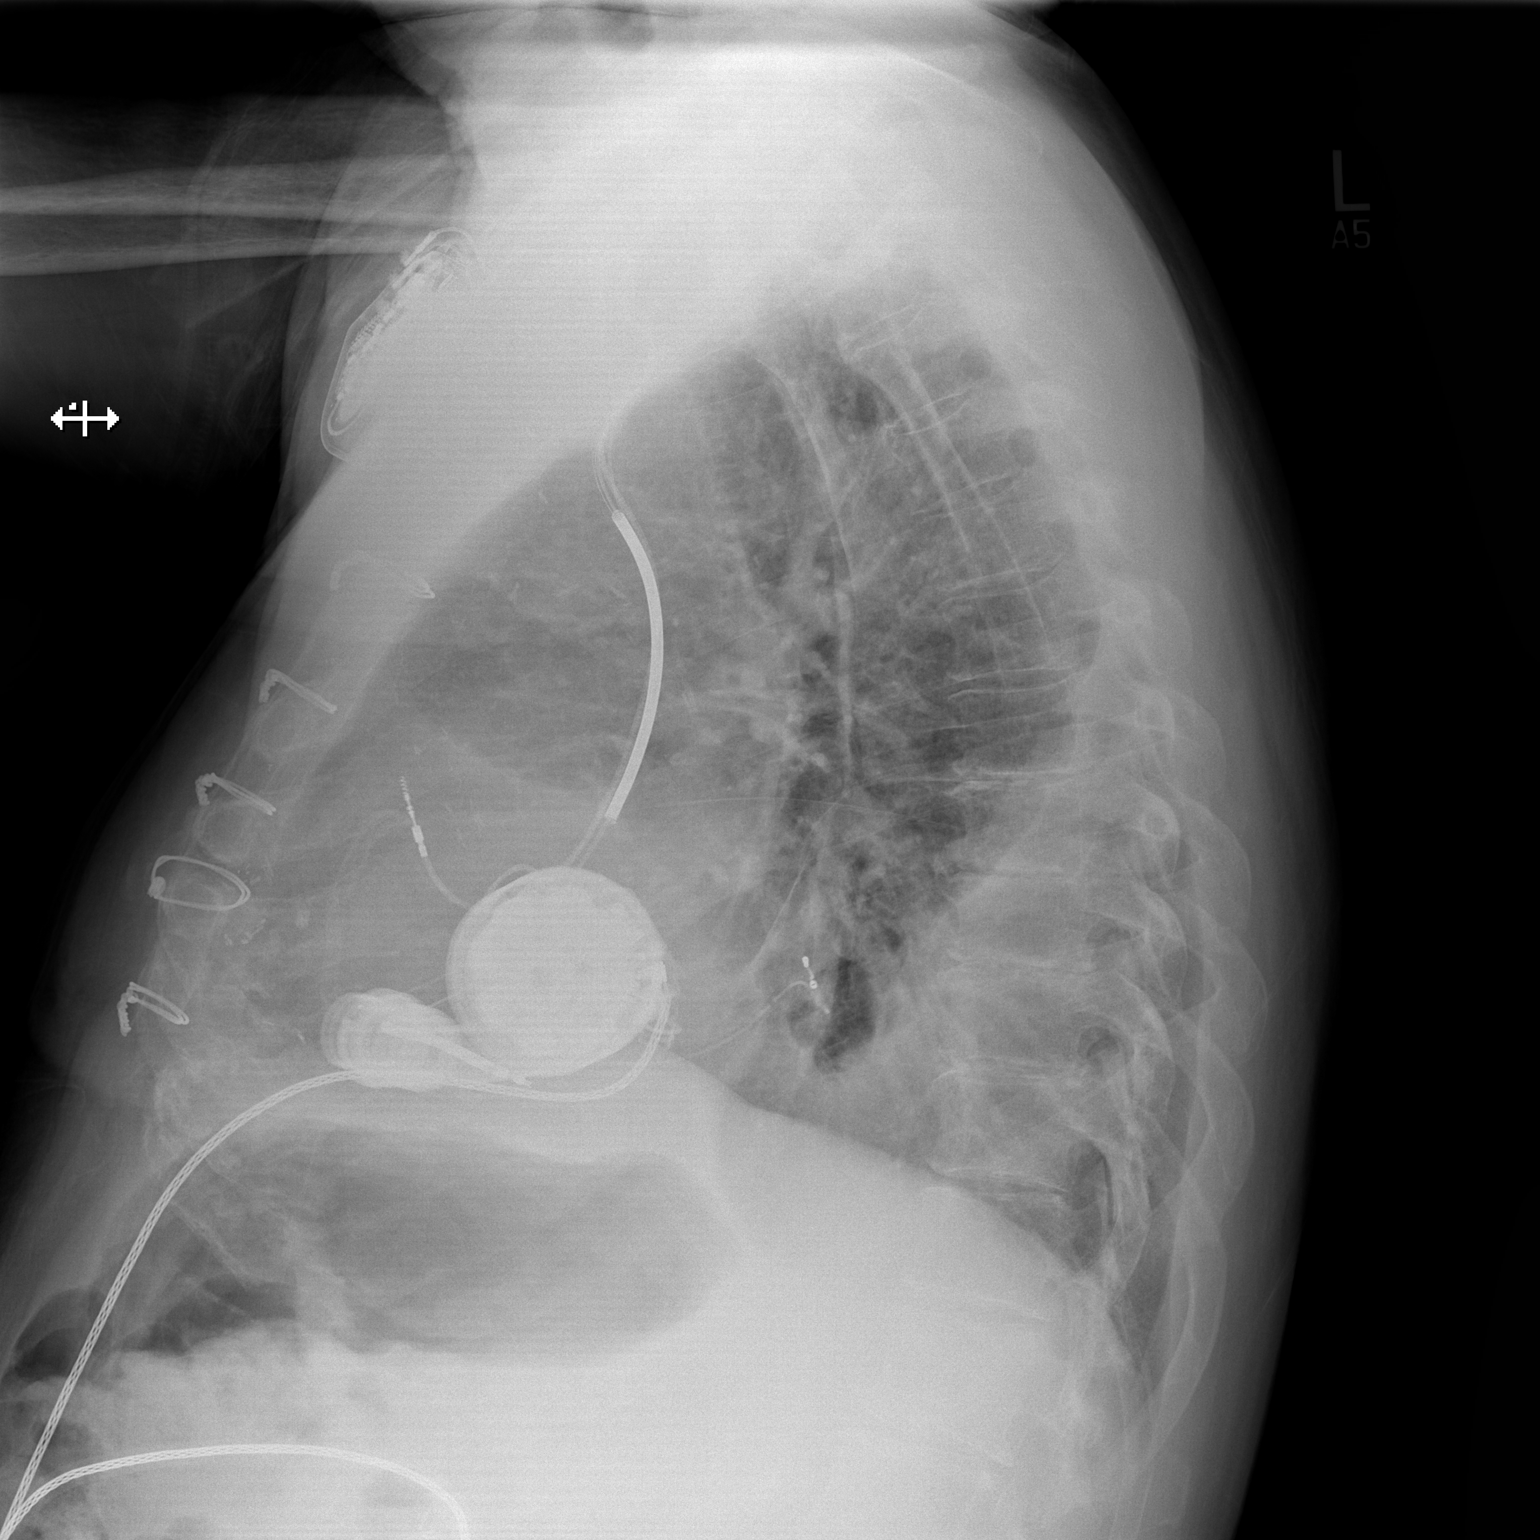

[2 of 2 positions shown; findings below may reference images not displayed]

FINDINGS: There is a fairly small pleural effusion on the left, stable.
Consolidation in the left base remains. There is mild left mid lung
and right base atelectasis. Lungs elsewhere clear. Heart is enlarged
with pulmonary vascularity within normal limits. There is
atherosclerotic calcification in the aorta. Pacemaker leads are
attached to the right atrium, right ventricle, and coronary sinus.
There is a left ventricular assist device. No adenopathy. No
pneumothorax. There is degenerative change in the thoracic spine.
IMPRESSION: Persistent left base consolidation with left pleural effusion. These
changes are stable compared to most recent study. Areas of mild
atelectasis in the right mid lung and left base regions. Stable
cardiomegaly. Aortic atherosclerosis present.

## 2018-04-07 MED ORDER — METOLAZONE 2.5 MG PO TABS: 2.5000 mg | ORAL_TABLET | ORAL | 1 refills | Status: AC

## 2018-04-07 MED ORDER — FUROSEMIDE 10 MG/ML IJ SOLN
120.0000 mg | Freq: Three times a day (TID) | INTRAMUSCULAR | Status: DC
  Administered 2018-04-07 – 2018-04-08 (×4): 120 mg via INTRAVENOUS
  Filled 2018-04-07 (×5): qty 12

## 2018-04-07 MED ORDER — POTASSIUM CHLORIDE CRYS ER 20 MEQ PO TBCR
60.0000 meq | EXTENDED_RELEASE_TABLET | Freq: Once | ORAL | Status: AC
  Administered 2018-04-07: 60 meq via ORAL
  Filled 2018-04-07: qty 3

## 2018-04-07 MED ORDER — LIDOCAINE HCL (PF) 2 % IJ SOLN
INTRAMUSCULAR | Status: AC
  Filled 2018-04-07: qty 20

## 2018-04-07 MED ORDER — TOLVAPTAN 15 MG PO TABS
30.0000 mg | ORAL_TABLET | Freq: Once | ORAL | Status: AC
  Administered 2018-04-07: 30 mg via ORAL
  Filled 2018-04-07: qty 2

## 2018-04-07 MED ORDER — POTASSIUM CHLORIDE CRYS ER 20 MEQ PO TBCR: 40.0000 meq | EXTENDED_RELEASE_TABLET | Freq: Two times a day (BID) | ORAL | 6 refills | Status: DC

## 2018-04-07 NOTE — Progress Notes (Addendum)
Advanced Heart Failure VAD Team Note  PCP-Cardiologist: Glori Bickers, MD   Subjective:     Paracentesis 04/03/18 in IR with 3 L off.   Remains on dobutamine drip @ 7.5 mcg/kg/min.  Co-ox 61%. CVP 16-17 Brisk diuresis with -2.7 L off, but weight up 3 lbs  (RN re-weighed and Ronald Miller is 207 today).   Wants to go to [facility] today. Denies SOB, CP, bleeding. No orthopnea or PND. No hepatic encephalopathy.   LVAD INTERROGATION:  HeartMate 3 LVAD:   Flow 5.4 liters/min, speed 5800, power 5.0, PI 2.4. Reviewed personally.    Objective:    Vital Signs:   Temp:  [97.5 F (36.4 C)-98 F (36.7 C)] 97.5 F (36.4 C) (07/05 0300) Pulse Rate:  [65-80] 80 (07/05 0327) Resp:  [15-24] 24 (07/05 0327) BP: (85-119)/(60-91) 89/71 (07/05 0327) SpO2:  [94 %-98 %] 95 % (07/05 0300) Weight:  [209 lb 12.8 oz (95.2 kg)] 209 lb 12.8 oz (95.2 kg) (07/05 0300) Last BM Date: 04/05/18 Mean arterial Pressure 65-80  Intake/Output:   Intake/Output Summary (Last 24 hours) at 04/07/2018 0709 Last data filed at 04/07/2018 0600 Gross per 24 hour  Intake 717.33 ml  Output 3453 ml  Net -2735.67 ml     Physical Exam   General: NAD.  HEENT: Normal. Anicteric Neck: Supple, JVP to ear. Carotids OK.  Cardiac:  Mechanical heart sounds with LVAD hum present.  Lungs:  CTAB, normal effort.  Abdomen:  + mildly distended, NT, no HSM. No bruits or masses. +BS  LVAD exit site: Dressing dry and intact. Stabilization device present and accurately applied. Extremities:  Warm and dry. No cyanosis, clubbing, rash. BLE trace edema.  Neuro: alert & oriented x 3, cranial nerves grossly intact. moves all 4 extremities w/o difficulty. Affect pleasant   Telemetry   AV paced 80. Personally reviewed.   Labs   Basic Metabolic Panel: Recent Labs  Lab 04/02/18 1849 04/03/18 0606  04/04/18 1446 04/05/18 0500 04/05/18 1400 04/06/18 0304 04/07/18 0503  NA 125* 126*   < > 128* 128* 130* 130* 125*  K 6.5* 4.6   < > 3.3*  3.7 3.6 3.6 3.0*  CL 87* 86*   < > 87* 88* 89* 86* 84*  CO2 25 30   < > 31 32 33* 34* 32  GLUCOSE 247* 182*   < > 176* 157* 170* 188* 215*  BUN 43* 43*   < > 35* 33* 31* 31* 30*  CREATININE 2.27* 2.10*   < > 1.42* 1.46* 1.35* 1.32* 1.17  CALCIUM 9.0 8.7*   < > 8.7* 8.6* 8.6* 8.8* 8.5*  MG 2.2 2.1  --   --   --   --   --   --    < > = values in this interval not displayed.    Liver Function Tests: Recent Labs  Lab 04/02/18 1849  AST 31  ALT 18  ALKPHOS 100  BILITOT 1.1  PROT 7.3  ALBUMIN 3.4*   No results for input(s): LIPASE, AMYLASE in the last 168 hours. Recent Labs  Lab 04/02/18 1849 04/03/18 0606  AMMONIA 59* 23    CBC: Recent Labs  Lab 04/02/18 1849 04/03/18 0606 04/04/18 0345 04/05/18 0500 04/06/18 0304 04/07/18 0503  WBC 9.9 6.3 5.6 5.0 5.7 6.8  NEUTROABS 8.2*  --   --   --   --   --   HGB 7.5* 7.1* 8.9* 9.0* 9.0* 8.7*  HCT 27.3* 25.6* 30.2* 31.2* 31.4* 29.9*  MCV  78.9 77.3* 79.9 80.0 80.3 80.6  PLT 189 162 159 157 156 149*    INR: Recent Labs  Lab 04/03/18 0606 04/04/18 0345 04/05/18 0630 04/06/18 0304 04/07/18 0503  INR 2.09 2.09 2.02 2.12 2.29    Other results:  EKG:    Imaging   No results found.   Medications:     Scheduled Medications: . acetaZOLAMIDE  500 mg Oral BID  . amiodarone  200 mg Oral Daily  . Chlorhexidine Gluconate Cloth  6 each Topical Q0600  . citalopram  20 mg Oral Daily  . furosemide  80 mg Intravenous TID  . gabapentin  300 mg Oral BID  . insulin aspart  5 Units Subcutaneous TID WC  . insulin glargine  10 Units Subcutaneous Q2200  . lactulose  45 g Oral TID  . levothyroxine  50 mcg Oral QAC breakfast  . magnesium oxide  400 mg Oral Daily  . metolazone  2.5 mg Oral BID  . midodrine  10 mg Oral TID WC  . multivitamin with minerals  1 tablet Oral Daily  . mupirocin ointment  1 application Nasal BID  . pantoprazole  40 mg Oral Daily  . potassium chloride SA  40 mEq Oral BID  . sucralfate  1 g Oral TID  WC & HS  . warfarin  5 mg Oral Q M,W,F-1800  . warfarin  7.5 mg Oral Q T,Th,S,Su-1800  . Warfarin - Pharmacist Dosing Inpatient   Does not apply q1800    Infusions: . DOBUTamine 7.8603 mcg/kg/min (04/07/18 0600)    PRN Medications: acetaminophen, acetaminophen, cyclobenzaprine, docusate sodium, lidocaine, ondansetron (ZOFRAN) IV, ondansetron (ZOFRAN) IV, senna-docusate, sodium chloride flush, traZODone   Patient Profile   Ronald Milleris a 62 y.o.male with history of chronic systolic HF s/p Medtronic ICD 2014, CAD s/p CABG in 2000, HTN, Hx of cocaine abuse, Tobacco abuse, Depression, PAF, and COPD. HM3 placed 09/09/2016. In April 2018 Ronald Miller was placed on milrinone but later switched to dobutamine. Ronald Miller remains on dobutamine 7.37mcg.   Admitted with volume overload and AKI.   Assessment/Plan:    1. Acute on Chronic end-stage biventricular systolic HF with R>>L HF: LVEF 15% due to ICM -> s/p Echo 08/24/16 LVEF 15%, RV mild dilated, moderately reduced. -->S/P HM3 LVAD 09/09/2016.  Has been turned down for transplant at [facility].  - Echo 2/18 EF 15. Severe RV dysfunction - Complicated by RV failure. Failed milrinone due to persistent RV failure/shock..   - Chronic NYHA IIIB-IV despite VAD support. Volume status remains elevated.  - Continue dobutamine 7.5 mcg and midodrine. Coox 61%. CVP 16-17 - Continue IV lasix 80 mg TID and metolazone 2.5 mg BID today.  Add diamox 500 bid - Continue midodrine 10 TID for BP support in setting of cirrhotic physiology - Off sildenafil due to hypotension - No beta blocker with RV failure   2. Acute on chronic renal failure (CKD3 at baseline) - Baseline creatinine 1.5-1.6 - Improving with diuresis. Creatinine 1.17 today.   3. Hyperkalemia > Hypokalemia - Kayexalate and IV lasix given in ER. Potassium and spiro held - K 3.0. Will supp.   4. Hyponatremia - 125 this am. Continue free water restriction. May need a dose of tolvaptan.   5. Heartmate-3  LVAD - VAD interrogated personally. Parameters stable. - INR today 2.29. Discussed dosing with PharmD personally. - LDH stable 129 - Driveline OK.  - Not transplant candidate due to social issues, noncompliance and cirrhosis (has been turned down by [facility])  6. Cirrhosis with ascites and hepatic encephalopathy - IR paracentesis 04/03/18 with 3 L off - Encephalopathy controlled with lactulose. Ammonia normal on admit. No change.   7. CAD: Severe 3v- CAD s/p CABG with occluded grafts as above except for LIMA.  - No s/s ischemia.   - Continue current therapy.   8. DMII:  - Cover with SSI. No change.  .  9. Atrial fibrillation, paroxysmal - Continue amio 200 daily. Careful with cirrhosis. No change.   10.  Anxiety/Depression - Continue Celexa 20mg  daily. No change.  - Social issues still a concern, though now long-term SNF.   11. Anemia, iron-deficient:   - Has had GI work up with EGD and Capsule Endoscopy showing severe gastropathy  - Recently received 2UPRBCS as outpatient.  - Hgb 7.5 > 7.1 > 2 uPRBC > 8.9 > 9.0 > 8.7. Denies bleeding.   12. DNR/DNI   Discussed with Dr Haroldine Laws. Will give him 120 mg IV lasix today + 30 mg tolvaptan. Ronald Miller is stable for discharge to [facility]. Will let CSW know.   I reviewed the LVAD parameters from today, and compared the results to the patient's prior recorded data.  No programming changes were made.  The LVAD is functioning within specified parameters.  The patient performs LVAD self-test daily.  LVAD interrogation was negative for any significant power changes, alarms or PI events/speed drops.  LVAD equipment check completed and is in good working order.  Back-up equipment present.   LVAD education done on emergency procedures and precautions and reviewed exit site care.  Length of Stay: [place], NP 04/07/2018, 7:09 AM  VAD Team --- VAD ISSUES ONLY--- Pager [phone] (7am - 7am)  Advanced Heart Failure Team  Pager [phone] (M-F;  7a - 4p)  Please contact [facility] for night-coverage after hours (4p -7a ) and weekends on [email or web address]  Patient seen and examined with the above-signed Advanced Practice Provider and/or Housestaff. I personally reviewed laboratory data, imaging studies and relevant notes. I independently examined the patient and formulated the important aspects of the plan. I have edited the note to reflect any of my changes or salient points. I have personally discussed the plan with the patient and/or family.  Overall improved but weight and CVP still up. Remains on dobutamine with co-ox 61%. Had paracentesis today with only 500cc off. Will diurese overnight and plan d/c to [facility] in am. [facility] & SW aware. INR 2.3 .Discussed dosing with PharmD personally. VAD interrogated personally. Parameters stable.  Glori Bickers, MD  6:18 PM

## 2018-04-07 NOTE — Plan of Care (Signed)
Continue current care plan 

## 2018-04-07 NOTE — Progress Notes (Signed)
Left abdominal sorbaview dressing dry but loose. Anchor also loose, with rash Sarah VAD coordinator will send new anchor for pt. Existing VAD dressing removed and site care performed. Dressing changed using sterile technique. Drive line exit site cleansed with betadine swabs x3, allowed to dry then reapplied sorbaview dressing without biopatch. New Drive line anchor applied. Next dressing change due in a week 04/14/2018

## 2018-04-07 NOTE — Progress Notes (Signed)
Clinical Social Worker following patient for support and discharge needs. Patient will return back to [facility] tomorrow morning. Carolynn Sayers from [facility] will come in the morning to set up his medication for transport. [facility] is aware patient wont be able to go until the morning.   Rhea Pink, MSW,  [place]

## 2018-04-07 NOTE — Procedures (Signed)
PROCEDURE SUMMARY:  Successful US guided paracentesis from right lateral abdomen.  Yielded 0.5 liters of yellow fluid.  No immediate complications.  Pt tolerated well.   Specimen was not sent for labs.  Docia Barrier PA-C 04/07/2018 1:15 PM

## 2018-04-07 NOTE — Progress Notes (Signed)
LVAD Coordinator Rounding Note:  Admitted 04/02/18 to Dr. Haroldine Laws due to ascites and CHF with fluid overload.   HM III LVAD implanted on 09/09/16 by Dr. Cyndia Bent under Destination Therapy criteria.  Pt sitting on the side of bed. States he is ready to go back to [facility].  Vital signs: Temp:  98 HR: 80 Doppler Pressure:  80 Automatic BP:  97/77 (84) O2 Sat: 95 % on RA Wt: 215>211>203>203>207 lbs  LVAD interrogation reveals:  Speed: 5800 Flow: 5.5 Power: 4.5 w PI:  2.8 Alarms: none Events: 2 Pi events Hct: 30 Fixed speed:  5800 Low speed limit: 5500  Drive Line: left abdominal sorbaview dressing dry and intact; anchor intact and accurately applied.Next dressing change due 04/11/18. Bedside nurse may change dressing.   Labs:  LDH trend: 186>156>146>145>129  INR trend: 2.30>2.09>2.09>2.02>2.29  Gtts: Dobutamine 7.5 mcg/kg/min  Anticoagulation Plan: -INR Goal:  1.8 - 2.3 -ASA Dose: no ASA (history of GI bleeding)  Device: -Protect; end of service 08/2016 -Therapies:  Off Last check: complete interrogation 05/26/2017. DDD (lower rate 60); BiV pacing with 96% AS-VP  Adverse Events on VAD: 11/30/16> Milrinone gtt at discharge 01/27/17>dobutamine at 5 mcg/kg/mingtt at discharge 03/08/17> RV failure 04/29/17> symptomatic anemia and melena. EGD revealed stomach ulcers. Carafate added; ASA stopped 05/31/17>Ongoing symptomatic anemia and melena; capsule study mild antral gastritis; poss tiny AVM right colon. Lower MAPS, Losartan cut back. 06/2017> admit with syncope, AMS, Dobutamine at 7.50mcg 11/09/17>Marked ascites, RV failure, and hepatic encephalopathy. Paracentesis on 2/7 and 2/12 with >11 liters fluid removed 12/27/17>Outpatient paracentesis with 2.8 liters removed 01/11/18>IP paracentesis with 4.8 liters removed 01/16/18>IP paracentesis w/2.4L removed 02/15/18>OP paracentesis w/ 5 L removed 02/23/18>IP paracentesis w/ 5.6 L removed   Plan/Recommendations:  1. Pt may d/c to  [facility] with follow up next Thursday at 1p. 2. Please call VAD pager with any VAD equipment or drive line issues.  Tanda Rockers RN, VAD Coordiantor 24/7 VAD pager: [phone]

## 2018-04-07 NOTE — Progress Notes (Signed)
[facility] for Coumadin Indication: LVAD  Allergies  Allergen Reactions  . Chlorhexidine Gluconate Itching and Rash  . Codeine Nausea And Vomiting and Other (See Comments)    In high doses  . Lipitor [Atorvastatin] Nausea Only and Other (See Comments)    Nausea with high doses, tolerates 20mg  dose (08/22/16)  . Tape Other (See Comments)    Paper tape is ok    Patient Measurements: Height: 5\' 9"  (175.3 cm) Weight: 207 lb 14.3 oz (94.3 kg) IBW/kg (Calculated) : 70.7   Vital Signs: Temp: 97.8 F (36.6 C) (07/05 0755) Temp Source: Oral (07/05 0755) BP: 140/112 (07/05 0755) Pulse Rate: 80 (07/05 0755)  Labs: Recent Labs    04/05/18 0500 04/05/18 0630 04/05/18 1400 04/06/18 0304 04/07/18 0503  HGB 9.0*  --   --  9.0* 8.7*  HCT 31.2*  --   --  31.4* 29.9*  PLT 157  --   --  156 149*  LABPROT  --  22.7*  --  23.6* 25.0*  INR  --  2.02  --  2.12 2.29  CREATININE 1.46*  --  1.35* 1.32* 1.17    Estimated Creatinine Clearance: 75.1 mL/min (by C-G formula based on SCr of 1.17 mg/dL).   Medical History: Past Medical History:  Diagnosis Date  . AICD (automatic cardioverter/defibrillator) present   . Anxiety   . ASCVD (arteriosclerotic cardiovascular disease)   . Chronic systolic CHF (congestive heart failure) ([place])   . COPD (chronic obstructive pulmonary disease) ([place])   . Coronary artery disease   . Depression   . GI bleed   . High cholesterol   . History of blood transfusion   . History of cocaine abuse   . Hypertension   . LVAD (left ventricular assist device) present ([place])   . Presence of permanent cardiac pacemaker   . Shortness of breath dyspnea   . Suicidal ideation   . Tobacco abuse   . Type II diabetes mellitus (H[facility])     Medications:  Medications Prior to Admission  Medication Sig Dispense Refill Last Dose  . acetaminophen (TYLENOL) 500 MG tablet Take 1,000 mg by mouth every 6 (six) hours as needed for moderate  pain.    unknown  . amiodarone (PACERONE) 200 MG tablet TAKE ONE TABLET BY MOUTH ONCE DAILY 60 tablet 6 04/02/2018 at 800  . Cholecalciferol (VITAMIN D3) 3000 units TABS Take 3,000 Units by mouth daily.   04/02/2018 at 800  . citalopram (CELEXA) 20 MG tablet Take 1 tablet (20 mg total) by mouth daily. 30 tablet 6 04/02/2018 at 800  . cyclobenzaprine (FLEXERIL) 10 MG tablet Take 1 tablet (10 mg total) by mouth 3 (three) times daily as needed for muscle spasms. (Patient taking differently: Take 10 mg by mouth every 8 (eight) hours as needed for muscle spasms. ) 30 tablet 1 03/18/2018 at 2158  . digoxin (DIGOX) 0.125 MG tablet Take 1 tablet (0.125 mg total) daily by mouth. 30 tablet 6 04/02/2018 at 800  . DOBUTamine (DOBUTREX) 4-5 MG/ML-% infusion Inject 714.75 mcg/min into the vein continuous. PER [facility] pharmacy 250 mL 11 04/02/2018 at 1430  . docusate sodium (COLACE) 100 MG capsule Take 100 mg by mouth at bedtime as needed (constipation).    unknown  . gabapentin (NEURONTIN) 300 MG capsule Take 1 capsule (300 mg total) by mouth 2 (two) times daily. 180 capsule 6 04/02/2018 at 800  . insulin aspart (NOVOLOG FLEXPEN) 100 UNIT/ML FlexPen Inject 5 Units into  the skin 3 (three) times daily with meals. 15 mL 11 04/02/2018 at 1200  . Insulin Detemir (LEVEMIR FLEXTOUCH) 100 UNIT/ML Pen Inject 10 Units into the skin at bedtime.   04/01/2018 at 2200  . lactulose (CHRONULAC) 10 GM/15ML solution Take 67.5 mLs (45 g total) by mouth 3 (three) times daily. 240 mL 6 04/02/2018 at 1300  . levothyroxine (SYNTHROID, LEVOTHROID) 50 MCG tablet Take 1 tablet (50 mcg total) by mouth daily before breakfast. 30 tablet 6 04/02/2018 at 630  . magnesium oxide (MAG-OX) 400 (241.3 Mg) MG tablet Take 1 tablet (400 mg total) by mouth daily. 30 tablet 6 04/02/2018 at 800  . metolazone (ZAROXOLYN) 2.5 MG tablet Take 2.5 mg by mouth daily.    04/02/2018 at 900  . midodrine (PROAMATINE) 10 MG tablet Take 1 tablet (10 mg total) by mouth 3 (three) times  daily with meals. 90 tablet 6 04/02/2018 at 1200  . Multiple Vitamin (MULTIVITAMIN WITH MINERALS) TABS tablet Take 1 tablet by mouth daily. Thera-M   04/02/2018 at 800  . ondansetron (ZOFRAN) 4 MG tablet Take 4 mg by mouth every 8 (eight) hours as needed for nausea or vomiting.   04/02/2018 at 649  . OXYGEN Inhale 2 L into the lungs as needed (for SATS below 90%).   unknown  . pantoprazole (PROTONIX) 40 MG tablet TAKE 1 TABLET BY MOUTH TWICE DAILY WITH A MEAL 60 tablet 6 04/02/2018 at 800  . potassium chloride SA (K-DUR,KLOR-CON) 20 MEQ tablet Take 4 tablets (80 mEq total) by mouth 2 (two) times daily. Take 40 meq in am and 20 meq in pm (Patient taking differently: Take 80 mEq by mouth 2 (two) times daily. ) 120 tablet 3 04/02/2018 at 900  . senna-docusate (SENOKOT S) 8.6-50 MG tablet Take 2 tablets by mouth at bedtime as needed for mild constipation. 30 tablet 6 unknown  . Skin Protectants, Misc. (MINERIN) CREA Apply 1 application topically See admin instructions. Apply to legs daily for dry skin   04/02/2018 at 1430  . spironolactone (ALDACTONE) 25 MG tablet Take 1 tablet (25 mg total) by mouth daily. 30 tablet 6 04/02/2018 at 800  . sucralfate (CARAFATE) 1 GM/10ML suspension Take 10 mLs (1 g total) by mouth 4 (four) times daily -  with meals and at bedtime. 420 mL 6 04/02/2018 at 1200  . torsemide (DEMADEX) 100 MG tablet Take 1 tablet (100 mg total) by mouth 2 (two) times daily. Take 100mg  in the morning and 50mg  in the evening (Patient taking differently: Take 100 mg by mouth 2 (two) times daily. 8am and 4pm)   04/02/2018 at 800  . warfarin (COUMADIN) 5 MG tablet Take 5mg  (1tab) Monday, Wednesday and Friday, take 7.5mg  (1.5tab) Tuesday, Thursday, Saturday and Sunday (Patient taking differently: Take 5 mg by mouth See admin instructions. New order 03/15/18: take 1 tablet (5 mg) by mouth Monday, Wednesday, Friday at 5pm) 60 tablet 3 03/31/2018 at 1700  . warfarin (COUMADIN) 7.5 MG tablet Take 7.5 mg by mouth See  admin instructions. Take 1 tablet (7.5 mg) by mouth every Tuesday, Thursday, Saturday and Sunday at 5pm   04/01/2018 at 1700  . Insulin Glargine (LANTUS SOLOSTAR) 100 UNIT/ML Solostar Pen Inject 10 Units into the skin daily at 10 pm. (Patient not taking: Reported on 04/02/2018) 1 pen 6 Not Taking at Unknown time  . ondansetron (ZOFRAN ODT) 4 MG disintegrating tablet Take 1 tablet (4 mg total) by mouth every 8 (eight) hours as needed for nausea. (Patient  not taking: Reported on 04/02/2018) 6 tablet 0 Not Taking at Unknown time  . traZODone (DESYREL) 50 MG tablet Take 1 tablet (50 mg total) by mouth at bedtime as needed for sleep. (Patient not taking: Reported on 04/02/2018) 30 tablet 6 Not Taking at Unknown time    Assessment: 62 yo man to continue coumadin for LVAD.  Warfarin regimen as of 6/25 at outpatient anticoag visit was 7.5 mg daily except 5 mg on Mon, Wed, Friday.   INR is 2.3 on admission. Last dose of warfarin was 6/28 as per SNF MAR? - missed dose on 6/30. INR today is 2.29, within goal range. Hgb is 8.7, plt 149, LDH stable s/p 2 PRBCs on 7/1. No s/sx of bleeding. On home amiodarone dose.   Goal of Therapy:  INR 1.8-2.4 Monitor platelets by anticoagulation protocol: Yes   Plan:  Continue warfarin per home regimen 5mg  MWF/7.5mg  TTSS Check daily PT/INR Monitor for bleeding complications  Marguerite Olea, [facility] Clinical Pharmacist Phone [phone]  04/07/2018 10:43 AM

## 2018-04-08 LAB — BASIC METABOLIC PANEL
Anion gap: 10 (ref 5–15)
BUN: 35 mg/dL — ABNORMAL HIGH (ref 8–23)
CHLORIDE: 84 mmol/L — AB (ref 98–111)
CO2: 34 mmol/L — ABNORMAL HIGH (ref 22–32)
CREATININE: 1.4 mg/dL — AB (ref 0.61–1.24)
Calcium: 8.5 mg/dL — ABNORMAL LOW (ref 8.9–10.3)
GFR calc Af Amer: >60 mL/min (ref >60)
GFR, EST NON AFRICAN AMERICAN: 53 mL/min — AB (ref >60)
GLUCOSE: 221 mg/dL — AB (ref 70–99)
POTASSIUM: 3 mmol/L — AB (ref 3.5–5.1)
SODIUM: 128 mmol/L — AB (ref 135–145)

## 2018-04-08 LAB — COOXEMETRY PANEL
CARBOXYHEMOGLOBIN: 1.9 % — AB (ref 0.5–1.5)
METHEMOGLOBIN: 1.8 % — AB (ref 0.0–1.5)
O2 SAT: 56.9 %
TOTAL HEMOGLOBIN: 8.7 g/dL — AB (ref 12.0–16.0)

## 2018-04-08 LAB — LACTATE DEHYDROGENASE: LDH: 125 U/L (ref 98–192)

## 2018-04-08 LAB — PROTIME-INR
INR: 2.39
PROTHROMBIN TIME: 25.9 s — AB (ref 11.4–15.2)

## 2018-04-08 LAB — GLUCOSE, CAPILLARY: GLUCOSE-CAPILLARY: 186 mg/dL — AB (ref 70–99)

## 2018-04-08 MED ORDER — POTASSIUM CHLORIDE CRYS ER 20 MEQ PO TBCR
80.0000 meq | EXTENDED_RELEASE_TABLET | Freq: Once | ORAL | Status: AC
  Administered 2018-04-08: 80 meq via ORAL

## 2018-04-08 NOTE — Clinical Social Work Note (Signed)
CSW facilitated patient discharge including contacting patient family (Sister: Iantha Fallen) and facility to confirm patient discharge plans. Clinical information faxed to facility and family agreeable with plan. CSW arranged ambulance transport via [facility] to [facility] at 10:30 am. RN to call report prior to discharge [phone] Room [phone]).  CSW will sign off for now as social work intervention is no longer needed. Please consult [place] again if new needs arise.  Dayton Scrape, [place]

## 2018-04-08 NOTE — Progress Notes (Signed)
[place] for Coumadin Indication: LVAD  Allergies  Allergen Reactions  . Chlorhexidine Gluconate Itching and Rash  . Codeine Nausea And Vomiting and Other (See Comments)    In high doses  . Lipitor [Atorvastatin] Nausea Only and Other (See Comments)    Nausea with high doses, tolerates 20mg  dose (08/22/16)  . Tape Other (See Comments)    Paper tape is ok    Patient Measurements: Height: 5\' 9"  (175.3 cm) Weight: 203 lb 1.6 oz (92.1 kg) IBW/kg (Calculated) : 70.7   Vital Signs: Temp: 98.5 F (36.9 C) (07/06 0700) Temp Source: Oral (07/06 0700) BP: 77/52 (07/06 0700) Pulse Rate: 80 (07/06 0342)  Labs: Recent Labs    04/06/18 0304 04/07/18 0503 04/08/18 0539  HGB 9.0* 8.7*  --   HCT 31.4* 29.9*  --   PLT 156 149*  --   LABPROT 23.6* 25.0* 25.9*  INR 2.12 2.29 2.39  CREATININE 1.32* 1.17 1.40*    Estimated Creatinine Clearance: 62.1 mL/min (A) (by C-G formula based on SCr of 1.4 mg/dL (H)).   Medical History: Past Medical History:  Diagnosis Date  . AICD (automatic cardioverter/defibrillator) present   . Anxiety   . ASCVD (arteriosclerotic cardiovascular disease)   . Chronic systolic CHF (congestive heart failure) ([place])   . COPD (chronic obstructive pulmonary disease) ([place])   . Coronary artery disease   . Depression   . GI bleed   . High cholesterol   . History of blood transfusion   . History of cocaine abuse   . Hypertension   . LVAD (left ventricular assist device) present ([place])   . Presence of permanent cardiac pacemaker   . Shortness of breath dyspnea   . Suicidal ideation   . Tobacco abuse   . Type II diabetes mellitus (HCC)     Medications:  Medications Prior to Admission  Medication Sig Dispense Refill Last Dose  . acetaminophen (TYLENOL) 500 MG tablet Take 1,000 mg by mouth every 6 (six) hours as needed for moderate pain.    unknown  . amiodarone (PACERONE) 200 MG tablet TAKE ONE TABLET BY MOUTH ONCE  DAILY 60 tablet 6 04/02/2018 at 800  . Cholecalciferol (VITAMIN D3) 3000 units TABS Take 3,000 Units by mouth daily.   04/02/2018 at 800  . citalopram (CELEXA) 20 MG tablet Take 1 tablet (20 mg total) by mouth daily. 30 tablet 6 04/02/2018 at 800  . cyclobenzaprine (FLEXERIL) 10 MG tablet Take 1 tablet (10 mg total) by mouth 3 (three) times daily as needed for muscle spasms. (Patient taking differently: Take 10 mg by mouth every 8 (eight) hours as needed for muscle spasms. ) 30 tablet 1 03/18/2018 at 2158  . digoxin (DIGOX) 0.125 MG tablet Take 1 tablet (0.125 mg total) daily by mouth. 30 tablet 6 04/02/2018 at 800  . DOBUTamine (DOBUTREX) 4-5 MG/ML-% infusion Inject 714.75 mcg/min into the vein continuous. PER [facility] pharmacy 250 mL 11 04/02/2018 at 1430  . docusate sodium (COLACE) 100 MG capsule Take 100 mg by mouth at bedtime as needed (constipation).    unknown  . gabapentin (NEURONTIN) 300 MG capsule Take 1 capsule (300 mg total) by mouth 2 (two) times daily. 180 capsule 6 04/02/2018 at 800  . insulin aspart (NOVOLOG FLEXPEN) 100 UNIT/ML FlexPen Inject 5 Units into the skin 3 (three) times daily with meals. 15 mL 11 04/02/2018 at 1200  . Insulin Detemir (LEVEMIR FLEXTOUCH) 100 UNIT/ML Pen Inject 10 Units into the skin at  bedtime.   04/01/2018 at 2200  . lactulose (CHRONULAC) 10 GM/15ML solution Take 67.5 mLs (45 g total) by mouth 3 (three) times daily. 240 mL 6 04/02/2018 at 1300  . levothyroxine (SYNTHROID, LEVOTHROID) 50 MCG tablet Take 1 tablet (50 mcg total) by mouth daily before breakfast. 30 tablet 6 04/02/2018 at 630  . magnesium oxide (MAG-OX) 400 (241.3 Mg) MG tablet Take 1 tablet (400 mg total) by mouth daily. 30 tablet 6 04/02/2018 at 800  . midodrine (PROAMATINE) 10 MG tablet Take 1 tablet (10 mg total) by mouth 3 (three) times daily with meals. 90 tablet 6 04/02/2018 at 1200  . Multiple Vitamin (MULTIVITAMIN WITH MINERALS) TABS tablet Take 1 tablet by mouth daily. Thera-M   04/02/2018 at 800  .  ondansetron (ZOFRAN) 4 MG tablet Take 4 mg by mouth every 8 (eight) hours as needed for nausea or vomiting.   04/02/2018 at 649  . OXYGEN Inhale 2 L into the lungs as needed (for SATS below 90%).   unknown  . pantoprazole (PROTONIX) 40 MG tablet TAKE 1 TABLET BY MOUTH TWICE DAILY WITH A MEAL 60 tablet 6 04/02/2018 at 800  . potassium chloride SA (K-DUR,KLOR-CON) 20 MEQ tablet Take 4 tablets (80 mEq total) by mouth 2 (two) times daily. Take 40 meq in am and 20 meq in pm (Patient taking differently: Take 80 mEq by mouth 2 (two) times daily. ) 120 tablet 3 04/02/2018 at 900  . senna-docusate (SENOKOT S) 8.6-50 MG tablet Take 2 tablets by mouth at bedtime as needed for mild constipation. 30 tablet 6 unknown  . Skin Protectants, Misc. (MINERIN) CREA Apply 1 application topically See admin instructions. Apply to legs daily for dry skin   04/02/2018 at 1430  . spironolactone (ALDACTONE) 25 MG tablet Take 1 tablet (25 mg total) by mouth daily. 30 tablet 6 04/02/2018 at 800  . sucralfate (CARAFATE) 1 GM/10ML suspension Take 10 mLs (1 g total) by mouth 4 (four) times daily -  with meals and at bedtime. 420 mL 6 04/02/2018 at 1200  . torsemide (DEMADEX) 100 MG tablet Take 1 tablet (100 mg total) by mouth 2 (two) times daily. Take 100mg  in the morning and 50mg  in the evening (Patient taking differently: Take 100 mg by mouth 2 (two) times daily. 8am and 4pm)   04/02/2018 at 800  . warfarin (COUMADIN) 5 MG tablet Take 5mg  (1tab) Monday, Wednesday and Friday, take 7.5mg  (1.5tab) Tuesday, Thursday, Saturday and Sunday (Patient taking differently: Take 5 mg by mouth See admin instructions. New order 03/15/18: take 1 tablet (5 mg) by mouth Monday, Wednesday, Friday at 5pm) 60 tablet 3 03/31/2018 at 1700  . warfarin (COUMADIN) 7.5 MG tablet Take 7.5 mg by mouth See admin instructions. Take 1 tablet (7.5 mg) by mouth every Tuesday, Thursday, Saturday and Sunday at 5pm   04/01/2018 at 1700  . [DISCONTINUED] metolazone (ZAROXOLYN) 2.5  MG tablet Take 2.5 mg by mouth daily.    04/02/2018 at 900  . Insulin Glargine (LANTUS SOLOSTAR) 100 UNIT/ML Solostar Pen Inject 10 Units into the skin daily at 10 pm. (Patient not taking: Reported on 04/02/2018) 1 pen 6 Not Taking at Unknown time  . ondansetron (ZOFRAN ODT) 4 MG disintegrating tablet Take 1 tablet (4 mg total) by mouth every 8 (eight) hours as needed for nausea. (Patient not taking: Reported on 04/02/2018) 6 tablet 0 Not Taking at Unknown time  . traZODone (DESYREL) 50 MG tablet Take 1 tablet (50 mg total) by mouth at  bedtime as needed for sleep. (Patient not taking: Reported on 04/02/2018) 30 tablet 6 Not Taking at Unknown time    Assessment: 62 yo man to continue coumadin for LVAD.  Warfarin regimen as of 6/25 at outpatient anticoag visit was 7.5 mg daily except 5 mg on Mon, Wed, Friday.   INR today is 2.3, stable on previous dose. Plan to go back to snf today. No adjustments warranted.   Goal of Therapy:  INR 1.8-2.4 Monitor platelets by anticoagulation protocol: Yes   Plan:  Continue warfarin per home regimen 5mg  MWF/7.5mg  TTSS Check daily PT/INR Monitor for bleeding complications  Erin Hearing PharmD., BCPS Clinical Pharmacist 04/08/2018 8:25 AM

## 2018-04-08 NOTE — Progress Notes (Signed)
Report called to [facility]. Pt being transported via ambulance to [facility] with back up emergency bag for lvad and dobutamine pump.

## 2018-04-11 ENCOUNTER — Other Ambulatory Visit (HOSPITAL_COMMUNITY): Payer: Self-pay | Admitting: *Deleted

## 2018-04-11 DIAGNOSIS — I5043 Acute on chronic combined systolic (congestive) and diastolic (congestive) heart failure: Secondary | ICD-10-CM

## 2018-04-11 DIAGNOSIS — Z7901 Long term (current) use of anticoagulants: Secondary | ICD-10-CM

## 2018-04-11 DIAGNOSIS — Z95811 Presence of heart assist device: Secondary | ICD-10-CM

## 2018-04-12 NOTE — Progress Notes (Signed)
Advanced Heart Failure Clinic Note    Referring Provider: Darylene Price, FNP Primary Care: [facility] Primary Cardiologist: Dr Haroldine Laws.  Transplant Center- [facility]  HPI: Ronald Miller. is a 62 y.o. male with history of chronic systolic HF s/p Med[employer] ICD 2014, CAD s/p CABG in 2000, HTN, Hx of cocaine abuse, Tobacco abuse, Depression, PAF, and COPD. HM3 placed 09/09/2016. In April 2018 he was placed on milrinone but later switched to dobutamine. He remains on dobutamine 7.15mcg.   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  06/01/2017- Capsule Endoscopy- normal except  tiny angiectasia, nonbleeding in the right colon.  Admitted 2/6-2/18/2019 with recurrent volume overload, RHF and ascites despite dobutamine support. Underwent paracentesis on 2/7 and 2/12. He had over 11 liters removed from paracentesis. Diuresed with IV lasix and transitioned back to home torsemide 100 mg/50mg  daily. Overall he lost 47 pounds.  Also treated for hepatic encephalopathy. MAPs were soft with diuresis so midodrine increased to 10 tid. Weight on d/c 195 pounds.   Admitted from clinic 01/10/18-01/27/18 with encephalopathy and acute on chronic CHF. Pt had somehow disconnected his dobutamine.  Weight up 30+ pounds from baseline. Dobutamine restarted and patient diuresed with lasix gtt and metolazone.  Overall, Pt down 31 lb from admit in setting of diuresis and paracentesis.   Admitted from clinic 02/23/18 - 03/06/18 with volume overload and 40 lb weight gain. Had been discharged from SNF as above on 5/18. He had been drinking lots of fluid. Initially had poor response to IV diuretics, required lasix gtt + diamox + metolazone for diuresis. Transitioned to torsemide 100 mg BID + metolazone 2x/week for home. Dobutamine continued at 7.5 mcg/kg/min. He diuresed 30 lbs. ICD adjusted by Dr. Lovena Le due to pacer spiker in his QRS. Palliative care  consulted and patient made DNR/DNI and patient arranged for long term placement at [facility].  Hospital course additionally complicated with open wound on chest. WOCN consulted and culture sent. Placed on doxy/ finished 03/07/18.   Admitted 6/30 - 04/07/18 with worsening CHF and renal failure. Diuresed with IV lasix, metolazone, and diamox. Diuresed 12 lbs, and had paracentesis with 3 L off on 7/1 (repeat 7/5 with 0.5 L off). His Cr improved with diuresis. Weight 203 on discharge back to [facility].  He presents today for post hospital follow up. His weight is actually down 6 lbs from discharge. He states he feels the best he has in quite some time. His fluid restriction has become more strict at [facility]. He denies SOB with ADLs. He denies orthopnea or PND. Abdomen is somewhat distended but soft. He denies fevers, chills, or problems with his drivelines. No bleeding, melena, or neuro symptoms. He is being given all medications as prescribed.   Vital Signs:  Doppler Pressure: 90 Automatc BP:  96/71 (78) HR: 92 SPO2: UTO %   Weight: 197 bs with eqt Last weight: 203 hospital d/c weight  VAD Indication: Destination Therapy - eval complete at [facility]  VAD interrogation & Equipment Management: Speed: 5800 Flow: 5.5 Power: 4.5 w PI: 2.6 Hct: 30  Alarms: Several no external powers from double disconnect. Reminded of proper procedure.  Events: Rare PI  daily  Fixed speed 5800 Low speed limit: 5000  Primary Controller: Replace back up battery in 18 months. Back up controller: Left bag at [facility].   Annual Equipment Maintenance on UBC/PM was performed on 09/2017.  Past Medical History:  Diagnosis  Date  . AICD (automatic cardioverter/defibrillator) present   . Anxiety   . ASCVD (arteriosclerotic cardiovascular disease)   . Chronic systolic CHF (congestive heart failure) ([place])   . COPD (chronic obstructive pulmonary disease) ([place])   . Coronary artery disease   . Depression     . GI bleed   . High cholesterol   . History of blood transfusion   . History of cocaine abuse   . Hypertension   . LVAD (left ventricular assist device) present ([place])   . Presence of permanent cardiac pacemaker   . Shortness of breath dyspnea   . Suicidal ideation   . Tobacco abuse   . Type II diabetes mellitus ([place])     Current Outpatient Medications  Medication Sig Dispense Refill  . acetaminophen (TYLENOL) 500 MG tablet Take 1,000 mg by mouth every 6 (six) hours as needed for moderate pain.     Marland Kitchen amiodarone (PACERONE) 200 MG tablet TAKE ONE TABLET BY MOUTH ONCE DAILY 60 tablet 6  . Cholecalciferol (VITAMIN D3) 3000 units TABS Take 3,000 Units by mouth daily.    . citalopram (CELEXA) 20 MG tablet Take 1 tablet (20 mg total) by mouth daily. 30 tablet 6  . cyclobenzaprine (FLEXERIL) 10 MG tablet Take 1 tablet (10 mg total) by mouth 3 (three) times daily as needed for muscle spasms. (Patient taking differently: Take 10 mg by mouth every 8 (eight) hours as needed for muscle spasms. ) 30 tablet 1  . DOBUTamine (DOBUTREX) 4-5 MG/ML-% infusion Inject 714.75 mcg/min into the vein continuous. PER [facility] pharmacy 250 mL 11  . docusate sodium (COLACE) 100 MG capsule Take 100 mg by mouth at bedtime as needed (constipation).     . gabapentin (NEURONTIN) 300 MG capsule Take 1 capsule (300 mg total) by mouth 2 (two) times daily. 180 capsule 6  . insulin aspart (NOVOLOG FLEXPEN) 100 UNIT/ML FlexPen Inject 5 Units into the skin 3 (three) times daily with meals. 15 mL 11  . Insulin Detemir (LEVEMIR FLEXTOUCH) 100 UNIT/ML Pen Inject 10 Units into the skin at bedtime.    Marland Kitchen lactulose (CHRONULAC) 10 GM/15ML solution Take 67.5 mLs (45 g total) by mouth 3 (three) times daily. 240 mL 6  . levothyroxine (SYNTHROID, LEVOTHROID) 50 MCG tablet Take 1 tablet (50 mcg total) by mouth daily before breakfast. 30 tablet 6  . magnesium oxide (MAG-OX) 400 (241.3 Mg) MG tablet Take 1 tablet (400 mg total) by mouth daily. 30  tablet 6  . metolazone (ZAROXOLYN) 2.5 MG tablet Take 1 tablet (2.5 mg total) by mouth every other day. 30 tablet 1  . midodrine (PROAMATINE) 10 MG tablet Take 1 tablet (10 mg total) by mouth 3 (three) times daily with meals. 90 tablet 6  . Multiple Vitamin (MULTIVITAMIN WITH MINERALS) TABS tablet Take 1 tablet by mouth daily. Thera-M    . ondansetron (ZOFRAN ODT) 4 MG disintegrating tablet Take 1 tablet (4 mg total) by mouth every 8 (eight) hours as needed for nausea. (Patient not taking: Reported on 04/02/2018) 6 tablet 0  . ondansetron (ZOFRAN) 4 MG tablet Take 4 mg by mouth every 8 (eight) hours as needed for nausea or vomiting.    . OXYGEN Inhale 2 L into the lungs as needed (for SATS below 90%).    . pantoprazole (PROTONIX) 40 MG tablet TAKE 1 TABLET BY MOUTH TWICE DAILY WITH A MEAL 60 tablet 6  . potassium chloride SA (K-DUR,KLOR-CON) 20 MEQ tablet Take 2 tablets (40 mEq total) by  mouth 2 (two) times daily. 120 tablet 6  . senna-docusate (SENOKOT S) 8.6-50 MG tablet Take 2 tablets by mouth at bedtime as needed for mild constipation. 30 tablet 6  . Skin Protectants, Misc. (MINERIN) CREA Apply 1 application topically See admin instructions. Apply to legs daily for dry skin    . spironolactone (ALDACTONE) 25 MG tablet Take 1 tablet (25 mg total) by mouth daily. 30 tablet 6  . sucralfate (CARAFATE) 1 GM/10ML suspension Take 10 mLs (1 g total) by mouth 4 (four) times daily -  with meals and at bedtime. 420 mL 6  . torsemide (DEMADEX) 100 MG tablet Take 1 tablet (100 mg total) by mouth 2 (two) times daily. Take 100mg  in the morning and 50mg  in the evening (Patient taking differently: Take 100 mg by mouth 2 (two) times daily. 8am and 4pm)    . traZODone (DESYREL) 50 MG tablet Take 1 tablet (50 mg total) by mouth at bedtime as needed for sleep. (Patient not taking: Reported on 04/02/2018) 30 tablet 6  . warfarin (COUMADIN) 5 MG tablet Take 5mg  (1tab) Monday, Wednesday and Friday, take 7.5mg  (1.5tab)  Tuesday, Thursday, Saturday and Sunday (Patient taking differently: Take 5 mg by mouth See admin instructions. New order 03/15/18: take 1 tablet (5 mg) by mouth Monday, Wednesday, Friday at 5pm) 60 tablet 3   No current facility-administered medications for this encounter.     Chlorhexidine gluconate; Codeine; Lipitor [atorvastatin]; and Tape  Review of systems complete and found to be negative unless listed in HPI.    Vitals:   04/13/18 1322 04/13/18 1323  BP: 96/71 (!) 90/0  Pulse:  92     Wt Readings from Last 3 Encounters:  04/13/18 197 lb (89.4 kg)  04/08/18 203 lb 1.6 oz (92.1 kg)  03/28/18 209 lb 9.6 oz (95.1 kg)     Physical Exam: General: Well appearing this am. NAD.  HEENT: Normal. Neck: Supple, JVP 7-8 cm. Carotids OK.  Cardiac:  Mechanical heart sounds with LVAD hum present.  Lungs:  CTAB, normal effort.  Abdomen:  NT, ND, no HSM. No bruits or masses. +BS  LVAD exit site: Well-healed and incorporated. Dressing dry and intact. No erythema or drainage. Stabilization device present and accurately applied. Driveline dressing changed daily per sterile technique. Extremities:  Warm and dry. No cyanosis, clubbing, rash, or edema.  Neuro:  Alert & oriented x 3. Cranial nerves grossly intact. Moves all 4 extremities w/o difficulty. Affect pleasant     ASSESSMENT AND PLAN: 1. Chronic end-stage biventricular systolic HF: LVEF 13% due to ICM -> s/p Echo 08/24/16 LVEF 15%, RV mild dilated, moderately reduced. 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  Has been turned down for [facility] at [facility].  - Complicated by RV failure. Failed milrinone due to persistent RV failure/shock..   - Chronic NYHA IIIB-IV despite VAD support.  - Continue dobutamine 7.5 mcg and midodrine.  - Volume status stable on exam.  - Continue torsemide 100 mg BID + metolazone 2.5 every other day.  - Continue midodrine 10 TID for low MAPs and cirrhotic physiology. MAPs stable   - Off sildenafil due to hypotension - No beta  blocker with RV failure.  - VAD interrogated personally. Parameters stable. - Warfarin with goal INR 2.0 - 2.5 + ASA 81 daily.  - INR 1.97 today. Discussed dosing with PharmD personally.  - LDH 153 - Driveline stable.  - Not transplant candidate due to social issues, noncompliance and cirrhosis (has been turned down by  [facility]) 2. CAD: Severe 3v- CAD s/p CABG with occluded grafts as above except for LIMA.  - No s/s of ischemia.    - Continue current therapy.  3. DMII:  - Per PCP 4. Tobacco abuse:  - He has stopped completely (in facility).  5. Atrial fibrillation, paroxysmal - Regular by exam.  - Continue amio 200 daily. Careful with cirrhosis  - No change to current plan.   6.  Anxiety/Depression - Continue Celexa 20mg  daily.  - Social issues still a concern, though now long-term SNF.  - No change to current plan.   7. Cirrhosis with hepatic encephalopathy - likely combination ETOH and chronic RV failure - No asterixis on exam today. Continues to have semi-frequent BMs on lactulose.  . Continue lactulose at 45 tid - Can schedule paracentesis as needed 10. Anemia, iron-deficiency:   - Has had GI work up with EGD and Capsule Endoscopy showing severe gastropath  - Hgb stable to improved at 9.3. - Repeat transfusion as needed.  - No change to current plan.   11. DNR/DNI  He is doing well overall. RTC 2 weeks. Sooner with symptoms. Labs today stable.   [name] Friar, PA-C  1:33 PM  Greater than 50% of the 30 minute visit was spent in counseling/coordination of care regarding disease state education, salt/fluid restriction, sliding scale diuretics, and medication compliance.

## 2018-04-13 ENCOUNTER — Telehealth (HOSPITAL_COMMUNITY): Payer: Self-pay | Admitting: *Deleted

## 2018-04-13 ENCOUNTER — Ambulatory Visit (HOSPITAL_COMMUNITY): Payer: Self-pay | Admitting: Pharmacist

## 2018-04-13 ENCOUNTER — Encounter (HOSPITAL_COMMUNITY): Payer: Self-pay

## 2018-04-13 ENCOUNTER — Ambulatory Visit (HOSPITAL_COMMUNITY)
Admission: RE | Admit: 2018-04-13 | Discharge: 2018-04-13 | Disposition: A | Discharge status: Home / Self Care | Payer: Medicare HMO | Source: Ambulatory Visit | Attending: Cardiology | Admitting: Cardiology

## 2018-04-13 VITALS — BP 90/0 | HR 92 | Ht 69.0 in | Wt 197.0 lb

## 2018-04-13 DIAGNOSIS — F329 Major depressive disorder, single episode, unspecified: Secondary | ICD-10-CM | POA: Diagnosis not present

## 2018-04-13 DIAGNOSIS — Z79890 Hormone replacement therapy (postmenopausal): Secondary | ICD-10-CM | POA: Insufficient documentation

## 2018-04-13 DIAGNOSIS — E785 Hyperlipidemia, unspecified: Secondary | ICD-10-CM | POA: Diagnosis not present

## 2018-04-13 DIAGNOSIS — F419 Anxiety disorder, unspecified: Secondary | ICD-10-CM | POA: Insufficient documentation

## 2018-04-13 DIAGNOSIS — Z66 Do not resuscitate: Secondary | ICD-10-CM | POA: Diagnosis not present

## 2018-04-13 DIAGNOSIS — J449 Chronic obstructive pulmonary disease, unspecified: Secondary | ICD-10-CM | POA: Insufficient documentation

## 2018-04-13 DIAGNOSIS — Z794 Long term (current) use of insulin: Secondary | ICD-10-CM

## 2018-04-13 DIAGNOSIS — K7460 Unspecified cirrhosis of liver: Secondary | ICD-10-CM | POA: Insufficient documentation

## 2018-04-13 DIAGNOSIS — Z95810 Presence of automatic (implantable) cardiac defibrillator: Secondary | ICD-10-CM | POA: Insufficient documentation

## 2018-04-13 DIAGNOSIS — I255 Ischemic cardiomyopathy: Secondary | ICD-10-CM | POA: Diagnosis not present

## 2018-04-13 DIAGNOSIS — I5022 Chronic systolic (congestive) heart failure: Secondary | ICD-10-CM

## 2018-04-13 DIAGNOSIS — Z79899 Other long term (current) drug therapy: Secondary | ICD-10-CM | POA: Diagnosis not present

## 2018-04-13 DIAGNOSIS — I2510 Atherosclerotic heart disease of native coronary artery without angina pectoris: Secondary | ICD-10-CM | POA: Diagnosis not present

## 2018-04-13 DIAGNOSIS — I5043 Acute on chronic combined systolic (congestive) and diastolic (congestive) heart failure: Secondary | ICD-10-CM | POA: Diagnosis not present

## 2018-04-13 DIAGNOSIS — I110 Hypertensive heart disease with heart failure: Secondary | ICD-10-CM | POA: Diagnosis not present

## 2018-04-13 DIAGNOSIS — K7290 Hepatic failure, unspecified without coma: Secondary | ICD-10-CM | POA: Insufficient documentation

## 2018-04-13 DIAGNOSIS — Z7901 Long term (current) use of anticoagulants: Secondary | ICD-10-CM | POA: Diagnosis not present

## 2018-04-13 DIAGNOSIS — I9589 Other hypotension: Secondary | ICD-10-CM

## 2018-04-13 DIAGNOSIS — E1169 Type 2 diabetes mellitus with other specified complication: Secondary | ICD-10-CM

## 2018-04-13 DIAGNOSIS — I159 Secondary hypertension, unspecified: Secondary | ICD-10-CM

## 2018-04-13 DIAGNOSIS — I480 Paroxysmal atrial fibrillation: Secondary | ICD-10-CM | POA: Diagnosis not present

## 2018-04-13 DIAGNOSIS — Z951 Presence of aortocoronary bypass graft: Secondary | ICD-10-CM | POA: Insufficient documentation

## 2018-04-13 DIAGNOSIS — E119 Type 2 diabetes mellitus without complications: Secondary | ICD-10-CM | POA: Diagnosis not present

## 2018-04-13 DIAGNOSIS — D509 Iron deficiency anemia, unspecified: Secondary | ICD-10-CM | POA: Diagnosis not present

## 2018-04-13 DIAGNOSIS — E7800 Pure hypercholesterolemia, unspecified: Secondary | ICD-10-CM | POA: Diagnosis not present

## 2018-04-13 DIAGNOSIS — Z95811 Presence of heart assist device: Secondary | ICD-10-CM

## 2018-04-13 LAB — CBC
HCT: 31.2 % — ABNORMAL LOW (ref 39.0–52.0)
HEMOGLOBIN: 9.3 g/dL — AB (ref 13.0–17.0)
MCH: 23 pg — ABNORMAL LOW (ref 26.0–34.0)
MCHC: 29.8 g/dL — ABNORMAL LOW (ref 30.0–36.0)
MCV: 77 fL — ABNORMAL LOW (ref 78.0–100.0)
PLATELETS: 172 10*3/uL (ref 150–400)
RBC: 4.05 MIL/uL — AB (ref 4.22–5.81)
RDW: 19.6 % — ABNORMAL HIGH (ref 11.5–15.5)
WBC: 6.6 10*3/uL (ref 4.0–10.5)

## 2018-04-13 LAB — BASIC METABOLIC PANEL
ANION GAP: 11 (ref 5–15)
BUN: 44 mg/dL — AB (ref 8–23)
CHLORIDE: 87 mmol/L — AB (ref 98–111)
CO2: 35 mmol/L — ABNORMAL HIGH (ref 22–32)
Calcium: 9.2 mg/dL (ref 8.9–10.3)
Creatinine, Ser: 1.57 mg/dL — ABNORMAL HIGH (ref 0.61–1.24)
GFR, EST AFRICAN AMERICAN: 53 mL/min — AB (ref >60)
GFR, EST NON AFRICAN AMERICAN: 46 mL/min — AB (ref >60)
Glucose, Bld: 162 mg/dL — ABNORMAL HIGH (ref 70–99)
POTASSIUM: 3.4 mmol/L — AB (ref 3.5–5.1)
SODIUM: 133 mmol/L — AB (ref 135–145)

## 2018-04-13 LAB — LACTATE DEHYDROGENASE: LDH: 153 U/L (ref 98–192)

## 2018-04-13 LAB — PROTIME-INR
INR: 1.97
PROTHROMBIN TIME: 22.3 s — AB (ref 11.4–15.2)

## 2018-04-13 NOTE — Progress Notes (Signed)
Pt presents to clinic for is hospital d/c follow up per transportation from [facility]. Pt in wheelchair, needs assistance to stand for weight and transfer to bed.   Pt reports he is feeling better than usual. States he has been walking more at [facility] and his fluids have been "cut back even more". Wt down to 197 lbs this visit.   Vital Signs:  Doppler Pressure: 90 Automatc BP:  96/71 (78) HR: 92 SPO2:unable to obtain %  Weight: 197 lbs without eqt Last weight: 207 lbs - hospital d/c wt  VAD Indication: Destination Therapy - eval complete at [facility]  VAD interrogation & Equipment Management: Speed:5800 Flow: 5.5 Power: 4.5w    PI: 2.6 Hct: 30  Alarms: several no external powers, pt states that he has been double disconnecting. Reminded him to only disconnect one power cable at a time.  Events: rare PI event   Fixed speed 5800 Low speed limit: 5500  Primary Controller:  Replace back up battery in 18 months. Back up controller:   Back up bag was left at [facility].  Annual Equipment Maintenance on UBC/PM was performed on 09/2017.   I reviewed the LVAD parameters from today and compared the results to the patient's prior recorded data. LVAD interrogation was NEGATIVE for significant power changes, NEGATIVE for clinical alarms and STABLE for PI events/speed drops. No programming changes were made and pump is functioning within specified parameters. Pt is performing daily controller and system monitor self tests along with completing weekly and monthly maintenance for LVAD equipment.  LVAD equipment check completed and is in good working order. Back-up equipment not present today. Reminded patient he should keep black bag with him at all times.    Exit Site Care: VAD coordinators are managing driveline dressing.. Dressing dry and intact with anchor intact and accurately applied. Dressing removed and site cleaned with sterile saline and betadine, velour fully implanted at  exit site. Site with no redness, tenderness, drainage, or rash noted. Pt will need weekly dressing changes.    Significant Events on VAD Support:  11/30/16> Milrinone gtt at discharge 01/27/17>dobutamine at 5 mcg/kg/mingtt at discharge 03/08/17> RV failure 04/29/17> symptomatic anemia and melena. EGD revealed stomach ulcers. Carafate added; ASA stopped 05/31/17>Ongoing symptomatic anemia and melena; capsule study mild antral gastritis; poss tiny AVM right colon. Lower MAPS, Losartan cut back. 06/2017> admit with syncope, AMS, Dobutamine at 7.37mg 11/09/17>Marked ascites, RV failure, and hepatic encephalopathy. Paracentesis on 2/7 and 2/12 with >11 liters fluid removed  Device:Protect Therapies: off; end of service 08/2016 Last check: complete interrogation 05/26/2017. DDD (lower rate 60); BiV pacing with 96% AS-VP  BP & Labs:  Doppler 90 - is reflecting modified systolic  Hgb 9.3 - No S/S of bleeding. Specifically denies melena/BRBPR or nosebleeds.  LDH stable at 153 with established baseline of 150- 250. Denies tea-colored urine. No power elevations noted on interrogation.    Written instructions (below) sent to [facility].  Patient Instructions: 1. No change in medications.  2. Change dressing in one week using daily dressing kit. Do not use CHG product or skin protectant. Clean site with Betadine and rinse with sterile normal saline packets included in kit. Make sure you replace anchor and keep drive line secured.  3. Return to [facility] Friday April 28, 2018 at , June 25th at 10:00 am.  MZada GirtRN VMount ClemensCoordinator   Office: [phone] Emergency VAD Pager: [phone]

## 2018-04-13 NOTE — Telephone Encounter (Signed)
Spoke with Leona [place], pt's nurse, and gave verbal order per Oda Kilts, PA for extra K-Dur 40 meq today for low K+ at today's clinic visit. She verbalized understanding of same.

## 2018-04-14 NOTE — Addendum Note (Signed)
Encounter addended by: [name] Friar, PA-C on: 04/14/2018 11:07 AM  Actions taken: Charge Capture section accepted

## 2018-04-20 IMAGING — US US ABDOMEN COMPLETE
1 series · 14 of 25 positions shown · non-contrast
Comparison: CT 03/19/2016

CLINICAL DATA: Workup for heart transplant.

EXAM:
ABDOMEN ULTRASOUND COMPLETE

[Series 1: us abdomen complete · 0.25mm/px · 14 of 62 slices shown]
[im 1/62]
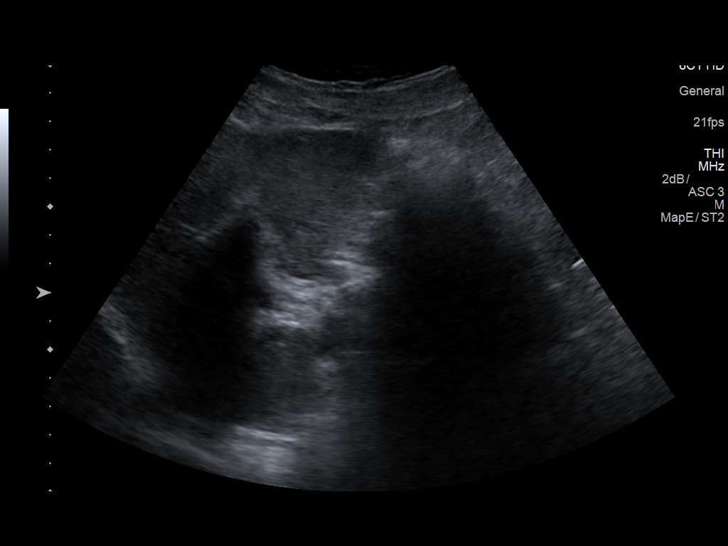
[im 6/62]
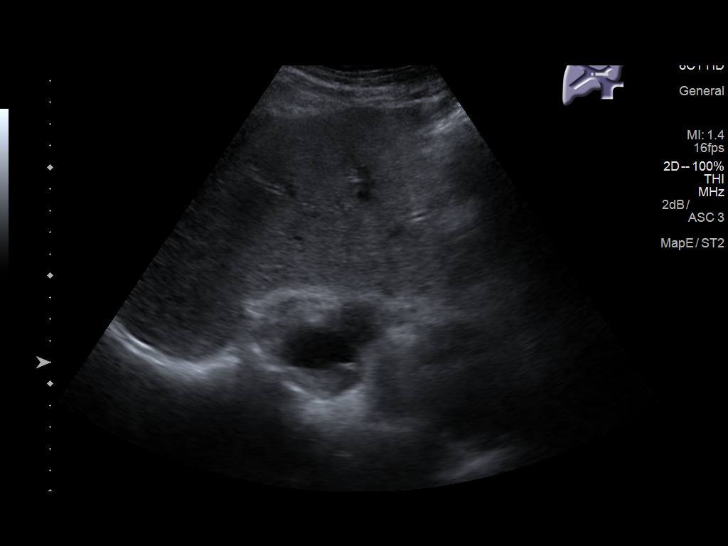
[im 11/62]
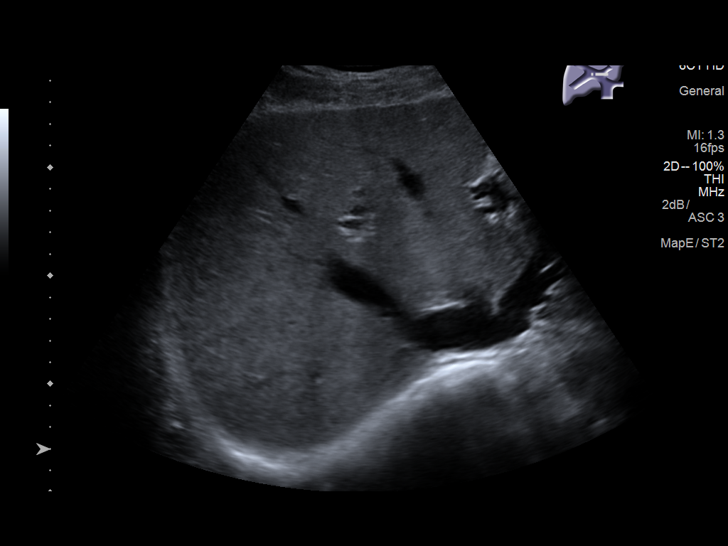
[im 16/62]
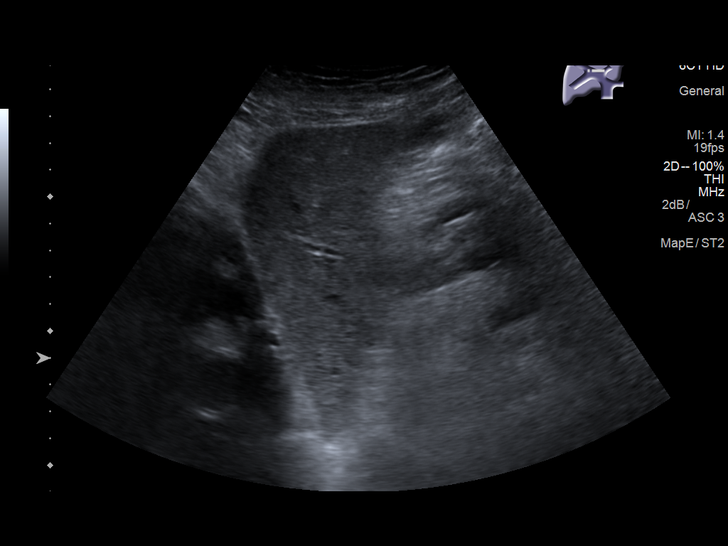
[im 21/62]
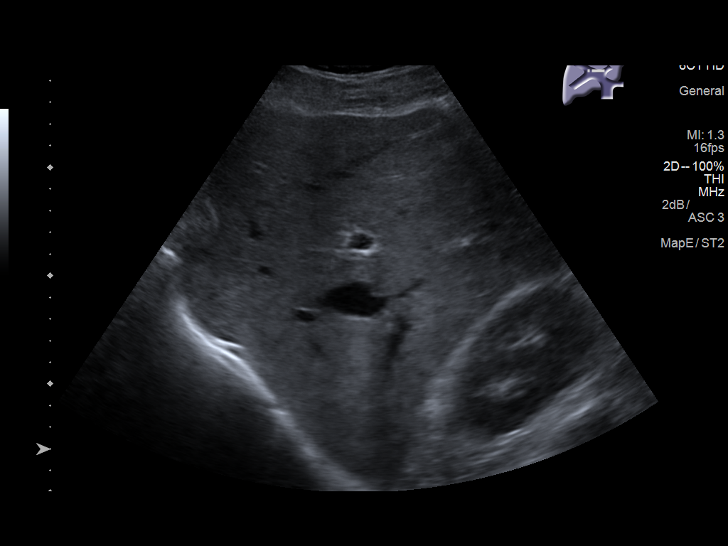
[im 23/62]
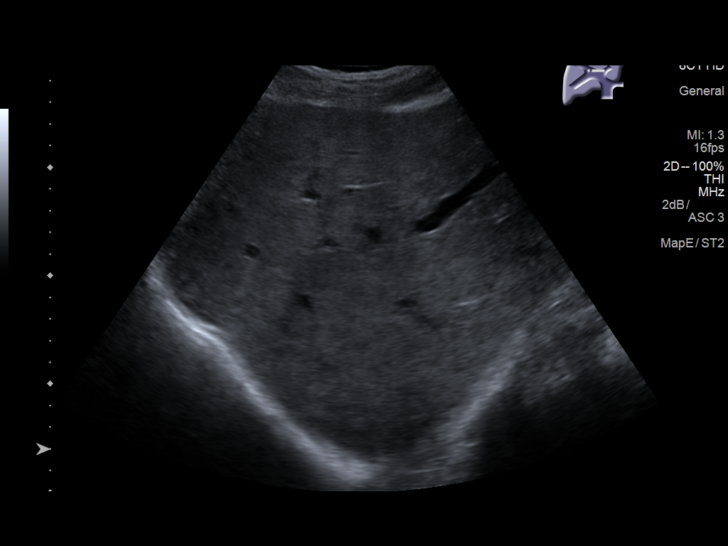
[im 28/62]
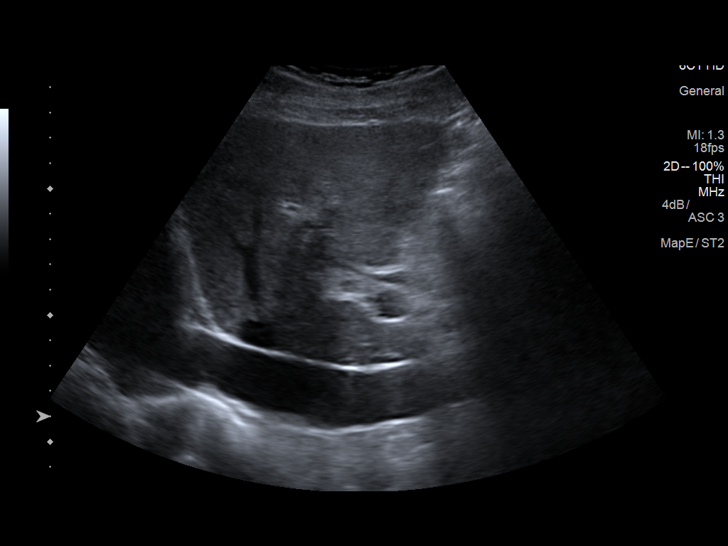
[im 34/62]
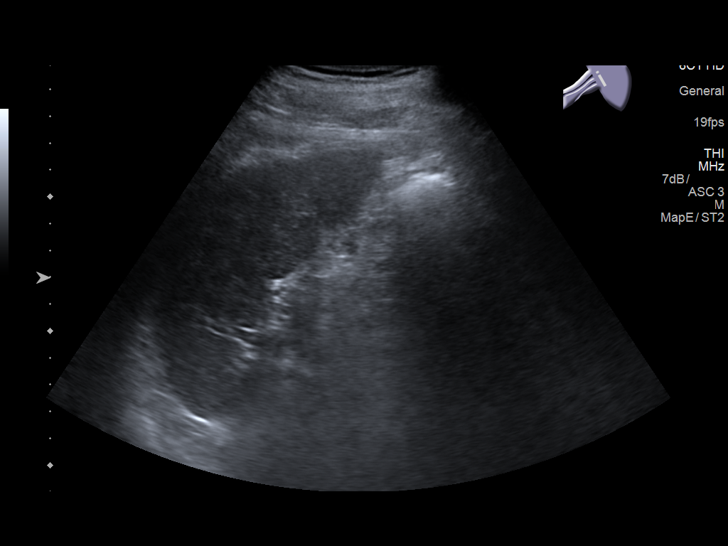
[im 39/62]
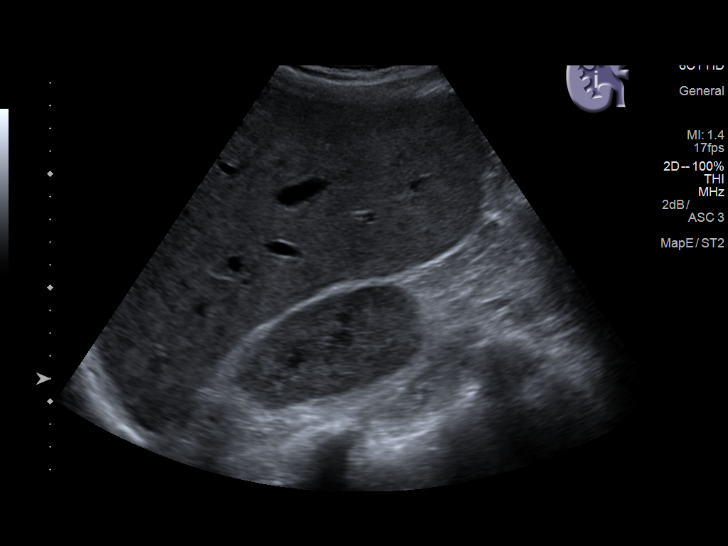
[im 41/62]
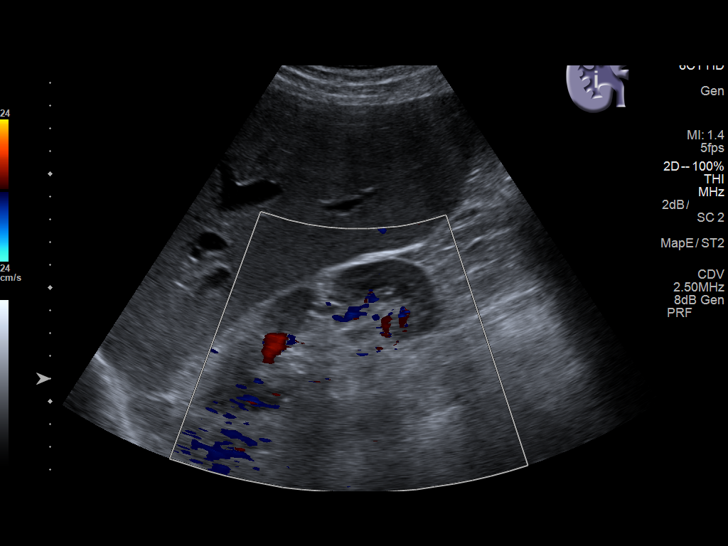
[im 46/62]
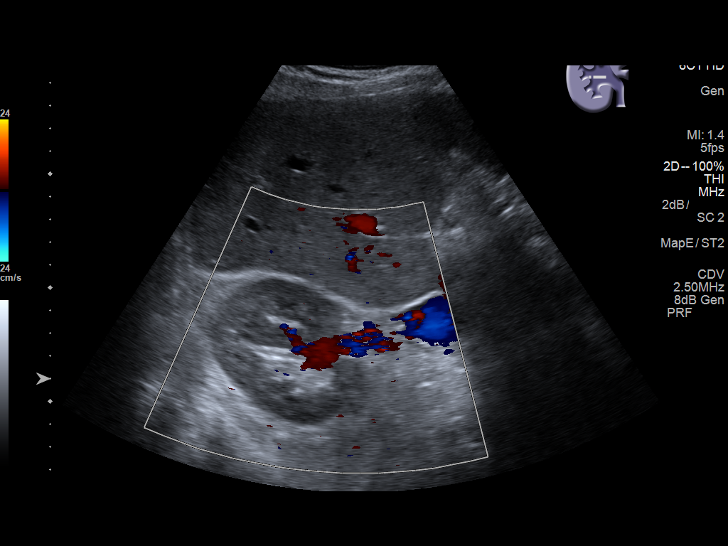
[im 51/62]
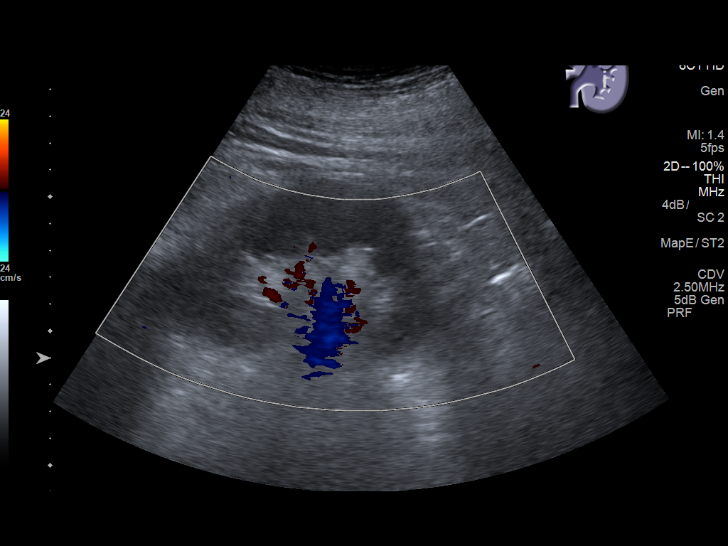
[im 56/62]
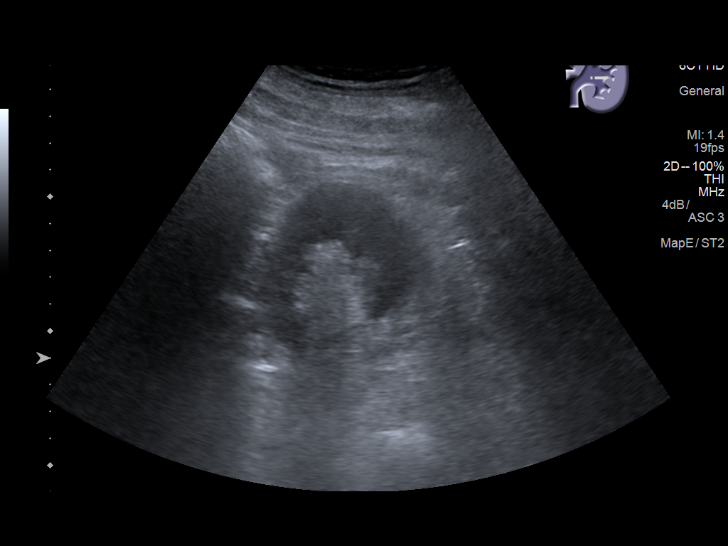
[im 62/62]
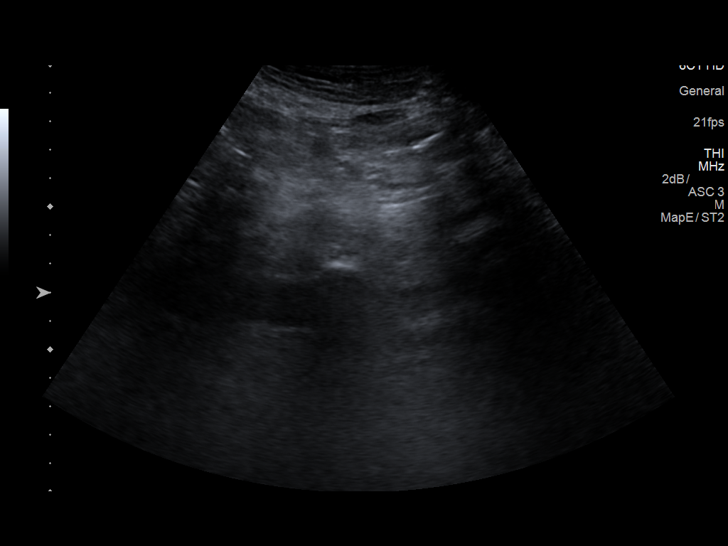

[14 of 25 positions shown; findings below may reference images not displayed]

FINDINGS: Gallbladder: Prior cholecystectomy

Common bile duct: Diameter: Normal for post cholecystectomy state, 7
mm.

Liver: Mildly increased echotexture suggesting fatty infiltration.
No focal abnormality or biliary duct dilatation.

IVC: No abnormality visualized.

Pancreas: Visualized portion unremarkable.

Spleen: Size and appearance within normal limits.

Right Kidney: Length: 12.0 cm. Echogenicity within normal limits. No
mass or hydronephrosis visualized.

Left Kidney: Length: 11.9 cm. Echogenicity within normal limits. No
mass or hydronephrosis visualized.

Abdominal aorta: No aneurysm visualized.

Other findings: None.
IMPRESSION: Prior cholecystectomy.

Mildly increased echotexture throughout the liver suggesting fatty
infiltration.

## 2018-04-20 IMAGING — DX DG CHEST 2V
2 series · 2 of 2 positions shown · non-contrast
Comparison: PA and lateral chest 11/17/2016.

CLINICAL DATA: Volume overload. Patient with left ventricular
assist device.

EXAM:
CHEST  2 VIEW

[w chest pa]
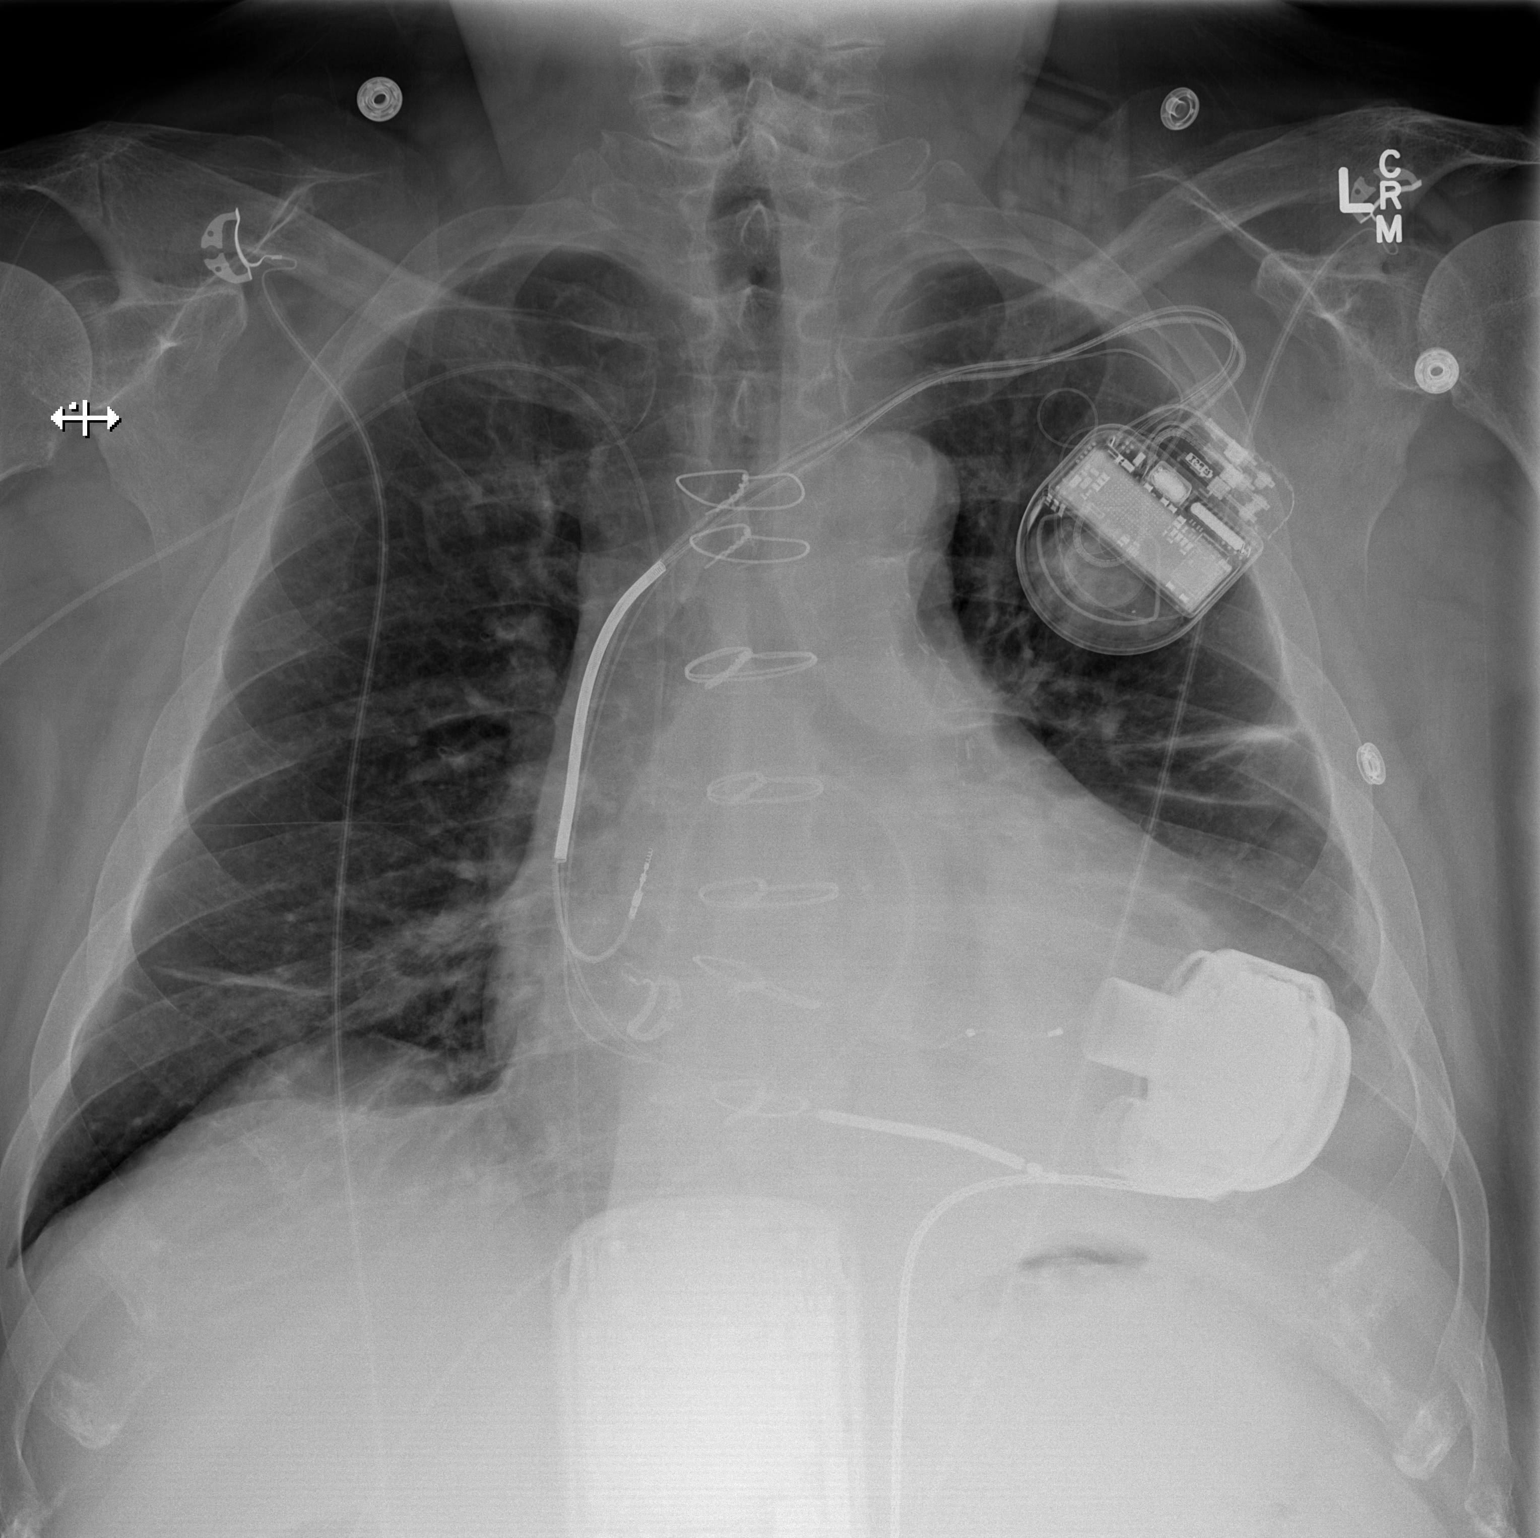

[w chest lat]
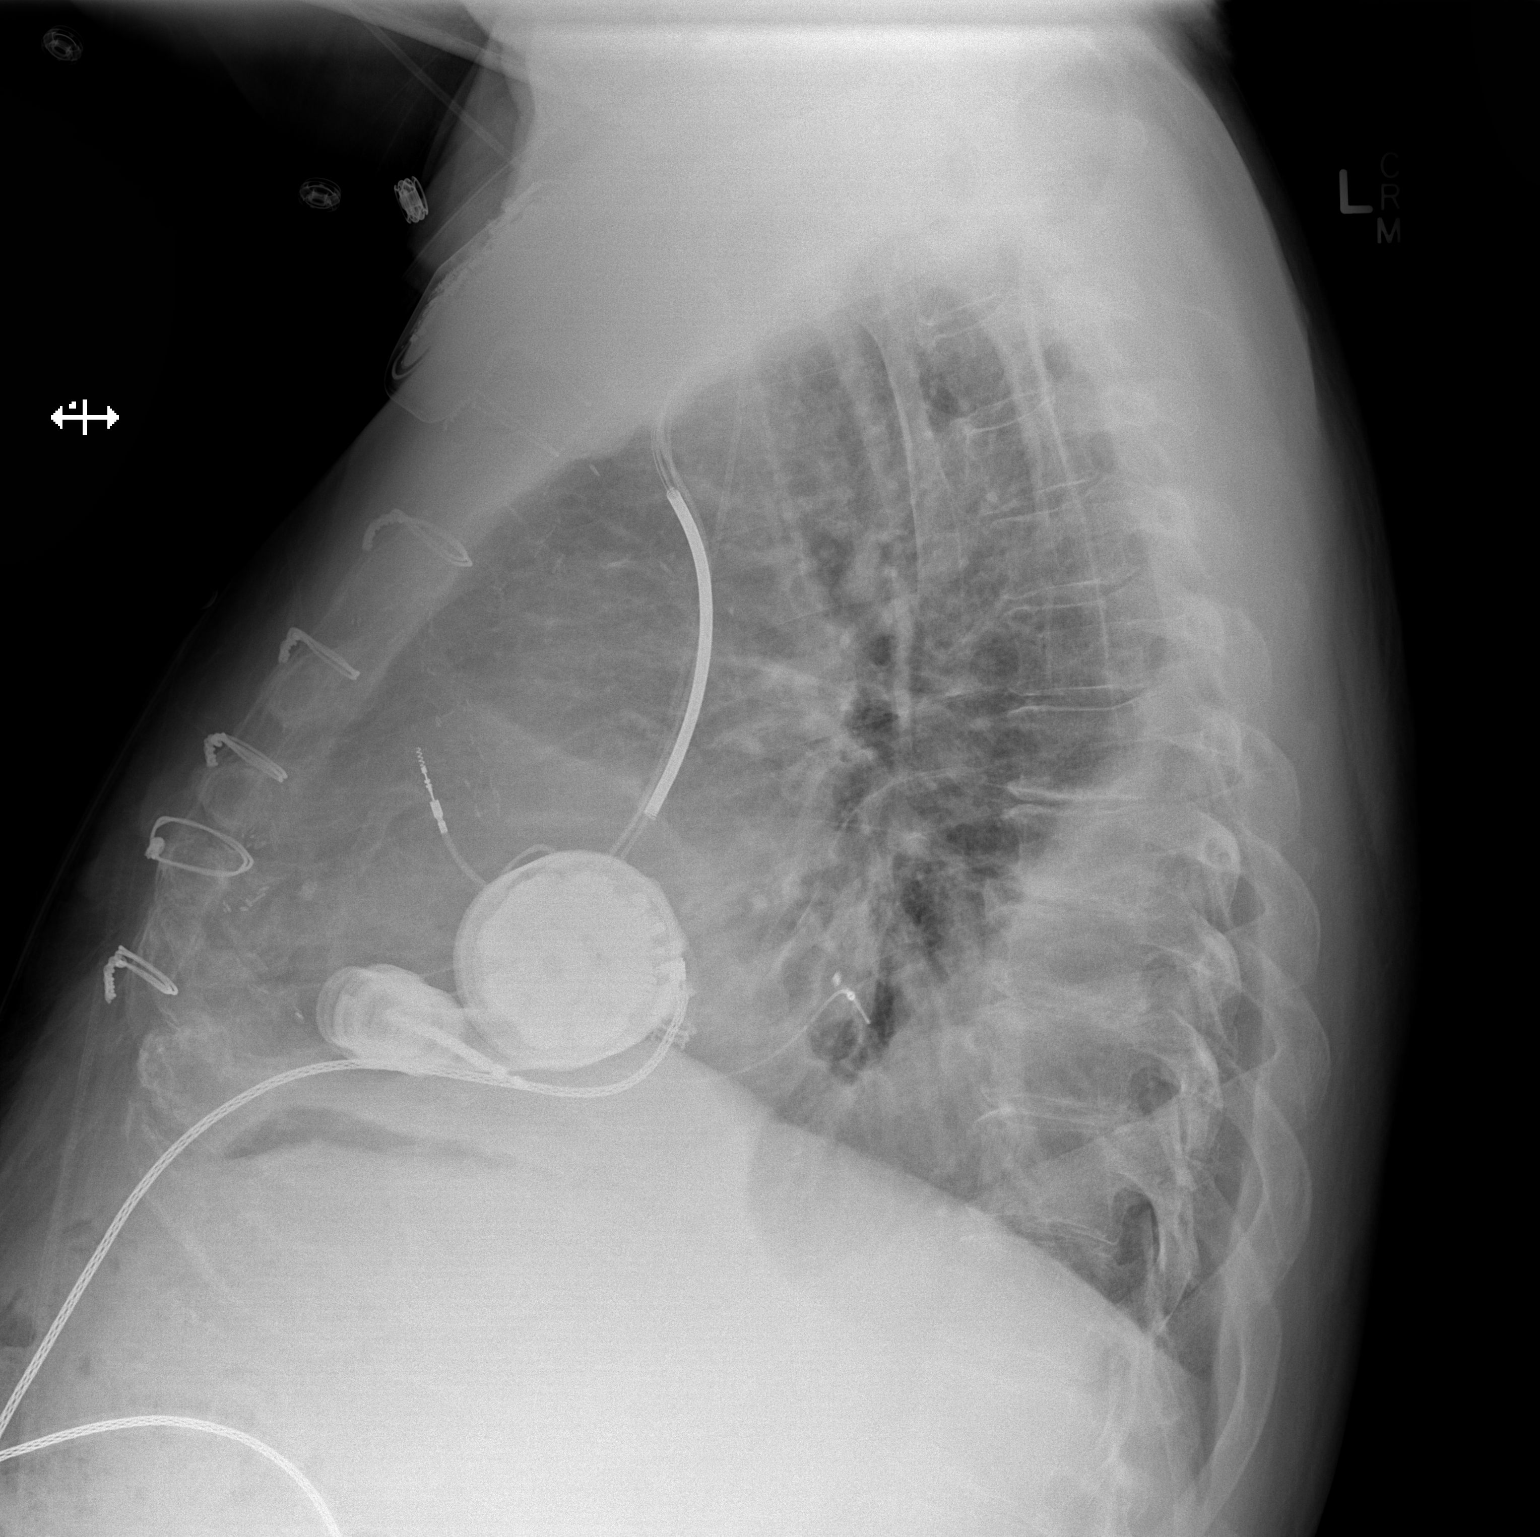

[2 of 2 positions shown; findings below may reference images not displayed]

FINDINGS: New right PICC is in place with the tip in the lower superior vena
cava. AICD and left ventricular assist device are again seen. There
is cardiomegaly. Left pleural effusion and basilar airspace disease
are unchanged. No evidence of pulmonary edema. No pneumothorax.
IMPRESSION: No acute abnormality.  Negative for pulmonary edema.

No change in a left pleural effusion and basilar airspace disease,
likely atelectasis.

Tip of right PICC is in the lower superior vena cava.

Cardiomegaly.

## 2018-04-21 ENCOUNTER — Telehealth (HOSPITAL_COMMUNITY): Payer: Self-pay | Admitting: Unknown Physician Specialty

## 2018-04-21 NOTE — Telephone Encounter (Signed)
Received a call from Gina-PA at [facility]. Pts weight is up and he is more edematous today than yesterday. Instructed PA to give pt 120 mg IV Lasix per Dr. Aundra Dubin and to hold his 100 mg dose of Demadex. Also instructed her to replace his K+. Gina aware that we are available by pager 24/7 at [facility] pager.   Tanda Rockers RN, BSN VAD Coordinator 24/7 Pager [phone]

## 2018-04-26 ENCOUNTER — Telehealth (HOSPITAL_COMMUNITY): Payer: Self-pay | Admitting: *Deleted

## 2018-04-26 NOTE — Telephone Encounter (Signed)
Barnett Applebaum, NP from [facility] called to report pt responded last Fri to IV Lasix and lost 4 lbs. He went to family BD party over weekend and he is now up 4 lbs with swelling in legs and abdomen. Per Dr. Haroldine Laws - asked her to give IV lasix 120 this evening and give extra dose of metolazone. She will cover with extra potassium for 24 hrs. We are scheduled to see pt in VAD clinic this Friday and will get a full set of labs. Barnett Applebaum verbalized understanding of same.

## 2018-04-28 ENCOUNTER — Ambulatory Visit (HOSPITAL_COMMUNITY)
Admission: RE | Admit: 2018-04-28 | Discharge: 2018-04-28 | Disposition: A | Discharge status: Home / Self Care | Payer: Medicare HMO | Source: Ambulatory Visit | Attending: Internal Medicine | Admitting: Internal Medicine

## 2018-04-28 ENCOUNTER — Ambulatory Visit (HOSPITAL_COMMUNITY)
Admission: RE | Admit: 2018-04-28 | Discharge: 2018-04-28 | Disposition: A | Discharge status: Home / Self Care | Payer: Medicare HMO | Source: Ambulatory Visit | Attending: Cardiology | Admitting: Cardiology

## 2018-04-28 ENCOUNTER — Other Ambulatory Visit (HOSPITAL_COMMUNITY): Payer: Self-pay | Admitting: *Deleted

## 2018-04-28 ENCOUNTER — Ambulatory Visit (HOSPITAL_COMMUNITY): Payer: Self-pay | Admitting: Pharmacist

## 2018-04-28 ENCOUNTER — Encounter (HOSPITAL_COMMUNITY): Payer: Self-pay

## 2018-04-28 VITALS — BP 103/72 | HR 82 | Ht 69.0 in | Wt 212.0 lb

## 2018-04-28 DIAGNOSIS — F419 Anxiety disorder, unspecified: Secondary | ICD-10-CM | POA: Insufficient documentation

## 2018-04-28 DIAGNOSIS — I5022 Chronic systolic (congestive) heart failure: Secondary | ICD-10-CM | POA: Diagnosis not present

## 2018-04-28 DIAGNOSIS — Z794 Long term (current) use of insulin: Secondary | ICD-10-CM | POA: Insufficient documentation

## 2018-04-28 DIAGNOSIS — Z95811 Presence of heart assist device: Secondary | ICD-10-CM

## 2018-04-28 DIAGNOSIS — J449 Chronic obstructive pulmonary disease, unspecified: Secondary | ICD-10-CM | POA: Insufficient documentation

## 2018-04-28 DIAGNOSIS — K7460 Unspecified cirrhosis of liver: Secondary | ICD-10-CM | POA: Insufficient documentation

## 2018-04-28 DIAGNOSIS — I5082 Biventricular heart failure: Secondary | ICD-10-CM | POA: Insufficient documentation

## 2018-04-28 DIAGNOSIS — I110 Hypertensive heart disease with heart failure: Secondary | ICD-10-CM | POA: Diagnosis not present

## 2018-04-28 DIAGNOSIS — Z7901 Long term (current) use of anticoagulants: Secondary | ICD-10-CM

## 2018-04-28 DIAGNOSIS — Z79899 Other long term (current) drug therapy: Secondary | ICD-10-CM | POA: Diagnosis not present

## 2018-04-28 DIAGNOSIS — E7800 Pure hypercholesterolemia, unspecified: Secondary | ICD-10-CM | POA: Diagnosis not present

## 2018-04-28 DIAGNOSIS — I509 Heart failure, unspecified: Secondary | ICD-10-CM | POA: Diagnosis not present

## 2018-04-28 DIAGNOSIS — R188 Other ascites: Secondary | ICD-10-CM

## 2018-04-28 DIAGNOSIS — E119 Type 2 diabetes mellitus without complications: Secondary | ICD-10-CM | POA: Diagnosis not present

## 2018-04-28 DIAGNOSIS — K7290 Hepatic failure, unspecified without coma: Secondary | ICD-10-CM | POA: Diagnosis not present

## 2018-04-28 DIAGNOSIS — Z950 Presence of cardiac pacemaker: Secondary | ICD-10-CM | POA: Insufficient documentation

## 2018-04-28 DIAGNOSIS — I159 Secondary hypertension, unspecified: Secondary | ICD-10-CM | POA: Diagnosis not present

## 2018-04-28 DIAGNOSIS — I2510 Atherosclerotic heart disease of native coronary artery without angina pectoris: Secondary | ICD-10-CM | POA: Insufficient documentation

## 2018-04-28 DIAGNOSIS — Z951 Presence of aortocoronary bypass graft: Secondary | ICD-10-CM | POA: Diagnosis not present

## 2018-04-28 DIAGNOSIS — I50810 Right heart failure, unspecified: Secondary | ICD-10-CM

## 2018-04-28 DIAGNOSIS — F329 Major depressive disorder, single episode, unspecified: Secondary | ICD-10-CM | POA: Insufficient documentation

## 2018-04-28 DIAGNOSIS — I480 Paroxysmal atrial fibrillation: Secondary | ICD-10-CM | POA: Insufficient documentation

## 2018-04-28 DIAGNOSIS — D649 Anemia, unspecified: Secondary | ICD-10-CM | POA: Diagnosis not present

## 2018-04-28 HISTORY — PX: IR PARACENTESIS: IMG2679

## 2018-04-28 LAB — CBC
HCT: 27.5 % — ABNORMAL LOW (ref 39.0–52.0)
Hemoglobin: 7.7 g/dL — ABNORMAL LOW (ref 13.0–17.0)
MCH: 22.1 pg — AB (ref 26.0–34.0)
MCHC: 28 g/dL — ABNORMAL LOW (ref 30.0–36.0)
MCV: 78.8 fL (ref 78.0–100.0)
PLATELETS: 209 10*3/uL (ref 150–400)
RBC: 3.49 MIL/uL — AB (ref 4.22–5.81)
RDW: 19.6 % — AB (ref 11.5–15.5)
WBC: 7.2 10*3/uL (ref 4.0–10.5)

## 2018-04-28 LAB — BASIC METABOLIC PANEL
Anion gap: 11 (ref 5–15)
BUN: 54 mg/dL — AB (ref 8–23)
CALCIUM: 8.9 mg/dL (ref 8.9–10.3)
CO2: 28 mmol/L (ref 22–32)
Chloride: 89 mmol/L — ABNORMAL LOW (ref 98–111)
Creatinine, Ser: 2.1 mg/dL — ABNORMAL HIGH (ref 0.61–1.24)
GFR calc Af Amer: 37 mL/min — ABNORMAL LOW (ref >60)
GFR, EST NON AFRICAN AMERICAN: 32 mL/min — AB (ref >60)
Glucose, Bld: 258 mg/dL — ABNORMAL HIGH (ref 70–99)
POTASSIUM: 4.8 mmol/L (ref 3.5–5.1)
Sodium: 128 mmol/L — ABNORMAL LOW (ref 135–145)

## 2018-04-28 LAB — LACTATE DEHYDROGENASE: LDH: 145 U/L (ref 98–192)

## 2018-04-28 LAB — PROTIME-INR
INR: 2.36
Prothrombin Time: 25.6 seconds — ABNORMAL HIGH (ref 11.4–15.2)

## 2018-04-28 MED ORDER — POTASSIUM CHLORIDE CRYS ER 20 MEQ PO TBCR: 40.0000 meq | EXTENDED_RELEASE_TABLET | Freq: Two times a day (BID) | ORAL | 6 refills | Status: AC

## 2018-04-28 MED ORDER — FUROSEMIDE 10 MG/ML IJ SOLN: 120.0000 mg | INTRAVENOUS | Status: DC | PRN

## 2018-04-28 MED ORDER — LIDOCAINE HCL (PF) 2 % IJ SOLN
INTRAMUSCULAR | Status: AC
  Filled 2018-04-28: qty 20

## 2018-04-28 MED ORDER — LIDOCAINE HCL (PF) 2 % IJ SOLN
INTRAMUSCULAR | Status: DC | PRN
  Administered 2018-04-28: 10 mL

## 2018-04-28 NOTE — Progress Notes (Signed)
Advanced Heart Failure Clinic Note    Referring Provider: Darylene Price, FNP Primary Care: [facility] Primary Cardiologist: Dr Haroldine Laws.  Transplant Center- [facility]  HPI: Choua Chalker. is a 62 y.o. male with history of chronic systolic HF s/p Medtronic ICD 2014, CAD s/p CABG in 2000, HTN, Hx of cocaine abuse, Tobacco abuse, Depression, PAF, and COPD. HM3 placed 09/09/2016. 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 In April 2018 he was placed on milrinone but later switched to dobutamine. He remains on dobutamine 7.6mcg.   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  06/01/2017- Capsule Endoscopy- normal except  tiny angiectasia, nonbleeding in the right colon.  Admitted 2/6-2/18/2019 with recurrent volume overload, RHF and ascites despite dobutamine support. Underwent paracentesis on 2/7 and 2/12. He had over 11 liters removed from paracentesis. Diuresed with IV lasix and transitioned back to home torsemide 100 mg/50mg  daily. Overall he lost 47 pounds.  Also treated for hepatic encephalopathy. MAPs were soft with diuresis so midodrine increased to 10 tid. Weight on d/c 195 pounds.   Admitted from clinic 01/10/18-01/27/18 with encephalopathy and acute on chronic CHF. Pt had somehow disconnected his dobutamine.  Weight up 30+ pounds from baseline. Dobutamine restarted and patient diuresed with lasix gtt and metolazone.  Overall, Pt down 31 lb from admit in setting of diuresis and paracentesis.   Admitted from clinic 02/23/18 - 03/06/18 with volume overload and 40 lb weight gain. Had been discharged from SNF as above on 5/18. He had been drinking lots of fluid. Initially had poor response to IV diuretics, required lasix gtt + diamox + metolazone for diuresis. Transitioned to torsemide 100 mg BID + metolazone 2x/week for home. Dobutamine continued at 7.5 mcg/kg/min. He diuresed 30 lbs. ICD adjusted by  Dr. Lovena Le due to pacer spiker in his QRS. Palliative care consulted and patient made DNR/DNI and patient arranged for long term placement at [facility].  Hospital course additionally complicated with open wound on chest. WOCN consulted and culture sent. Placed on doxy/ finished 03/07/18.   Admitted 6/30 - 04/07/18 with worsening CHF and renal failure. Diuresed with IV lasix, metolazone, and diamox. Diuresed 12 lbs, and had paracentesis with 3 L off on 7/1 (repeat 7/5 with 0.5 L off). His Cr improved with diuresis. Weight 203 on discharge back to [facility].  He presents today for regular follow up.Remains at [facility]. Weight up 15 lbs from last week despite IV lasix twice in same time period. He denies SOB with ADLs, but has been having orthopnea. Per the [facility] RN, he was "caught" with a glass in the bathroom he has been using to break his fluid restriction. Abdomen is very distended and tight with increased peripheral edema. He denies bleeding, melena, or neuro symptoms. He denies fevers, chills, or problems with his driveline. He is being given his medications as prescribed. Asking multiple times for something to drink in clinic.   VAD Indication: Destination Therapy - eval complete at [facility]  VAD interrogation & Equipment Management: Speed: 5800 Flow: 5.3 Power: 4.5 w PI: 2.8  Alarms: 1 No ext power.  Events: 0-5 PI events  Fixed speed 5800 Low speed limit: 5000  Primary Controller: Replace back up battery in 97months. Back up controller: Did not bring back up.   Annual Equipment Maintenance on UBC/PM was performed on 09/2017.  Past Medical History:  Diagnosis Date  . AICD (automatic cardioverter/defibrillator) present   .  Anxiety   . ASCVD (arteriosclerotic cardiovascular disease)   . Chronic systolic CHF (congestive heart failure) ([place])   . COPD (chronic obstructive pulmonary disease) ([place])   . Coronary artery disease   . Depression   . GI bleed   . High  cholesterol   . History of blood transfusion   . History of cocaine abuse   . Hypertension   . LVAD (left ventricular assist device) present ([place])   . Presence of permanent cardiac pacemaker   . Shortness of breath dyspnea   . Suicidal ideation   . Tobacco abuse   . Type II diabetes mellitus ([place])     Current Outpatient Medications  Medication Sig Dispense Refill  . acetaminophen (TYLENOL) 500 MG tablet Take 1,000 mg by mouth every 6 (six) hours as needed for moderate pain.     Marland Kitchen amiodarone (PACERONE) 200 MG tablet TAKE ONE TABLET BY MOUTH ONCE DAILY 60 tablet 6  . Cholecalciferol (VITAMIN D3) 3000 units TABS Take 3,000 Units by mouth daily.    . citalopram (CELEXA) 20 MG tablet Take 1 tablet (20 mg total) by mouth daily. (Patient taking differently: Take 10 mg by mouth daily. ) 30 tablet 6  . cyclobenzaprine (FLEXERIL) 10 MG tablet Take 1 tablet (10 mg total) by mouth 3 (three) times daily as needed for muscle spasms. 30 tablet 1  . docusate sodium (COLACE) 100 MG capsule Take 100 mg by mouth at bedtime as needed (constipation).     . gabapentin (NEURONTIN) 300 MG capsule Take 1 capsule (300 mg total) by mouth 2 (two) times daily. 180 capsule 6  . insulin aspart (NOVOLOG FLEXPEN) 100 UNIT/ML FlexPen Inject 5 Units into the skin 3 (three) times daily with meals. 15 mL 11  . Insulin Detemir (LEVEMIR FLEXTOUCH) 100 UNIT/ML Pen Inject 10 Units into the skin at bedtime.    Marland Kitchen lactulose (CHRONULAC) 10 GM/15ML solution Take 67.5 mLs (45 g total) by mouth 3 (three) times daily. 240 mL 6  . levothyroxine (SYNTHROID, LEVOTHROID) 50 MCG tablet Take 1 tablet (50 mcg total) by mouth daily before breakfast. 30 tablet 6  . magnesium oxide (MAG-OX) 400 (241.3 Mg) MG tablet Take 1 tablet (400 mg total) by mouth daily. 30 tablet 6  . metolazone (ZAROXOLYN) 2.5 MG tablet Take 1 tablet (2.5 mg total) by mouth every other day. 30 tablet 1  . midodrine (PROAMATINE) 10 MG tablet Take 1 tablet (10 mg total) by  mouth 3 (three) times daily with meals. 90 tablet 6  . Multiple Vitamin (MULTIVITAMIN WITH MINERALS) TABS tablet Take 1 tablet by mouth daily. Thera-M    . ondansetron (ZOFRAN ODT) 4 MG disintegrating tablet Take 1 tablet (4 mg total) by mouth every 8 (eight) hours as needed for nausea. 6 tablet 0  . ondansetron (ZOFRAN) 4 MG tablet Take 4 mg by mouth every 8 (eight) hours as needed for nausea or vomiting.    . pantoprazole (PROTONIX) 40 MG tablet TAKE 1 TABLET BY MOUTH TWICE DAILY WITH A MEAL 60 tablet 6  . potassium chloride SA (K-DUR,KLOR-CON) 20 MEQ tablet Take 2 tablets (40 mEq total) by mouth 2 (two) times daily. 120 tablet 6  . spironolactone (ALDACTONE) 25 MG tablet Take 1 tablet (25 mg total) by mouth daily. 30 tablet 6  . sucralfate (CARAFATE) 1 GM/10ML suspension Take 10 mLs (1 g total) by mouth 4 (four) times daily -  with meals and at bedtime. 420 mL 6  . torsemide (DEMADEX) 100 MG  tablet Take 1 tablet (100 mg total) by mouth 2 (two) times daily. Take 100mg  in the morning and 50mg  in the evening    . traZODone (DESYREL) 50 MG tablet Take 1 tablet (50 mg total) by mouth at bedtime as needed for sleep. 30 tablet 6  . warfarin (COUMADIN) 5 MG tablet Take 5mg  (1tab) Monday, Wednesday and Friday, take 7.5mg  (1.5tab) Tuesday, Thursday, Saturday and Sunday 60 tablet 3  . DOBUTamine (DOBUTREX) 4-5 MG/ML-% infusion Inject 714.75 mcg/min into the vein continuous. PER [facility] pharmacy 250 mL 11  . OXYGEN Inhale 2 L into the lungs as needed (for SATS below 90%).    Marland Kitchen senna-docusate (SENOKOT S) 8.6-50 MG tablet Take 2 tablets by mouth at bedtime as needed for mild constipation. 30 tablet 6  . Skin Protectants, Misc. (MINERIN) CREA Apply 1 application topically See admin instructions. Apply to legs daily for dry skin     No current facility-administered medications for this encounter.     Chlorhexidine gluconate; Codeine; Lipitor [atorvastatin]; and Tape  Review of systems complete and found to be  negative unless listed in HPI.    Vital Signs:  Doppler Pressure: 86 Automatc BP:  103/72 (82) HR: 82 SPO2: UTO %   Weight: 212 lbs Last weight: 197 lbs with eqt  Wt Readings from Last 3 Encounters:  04/13/18 197 lb (89.4 kg)  04/08/18 203 lb 1.6 oz (92.1 kg)  03/28/18 209 lb 9.6 oz (95.1 kg)     Physical Exam: General: Chronically ill appearing.  HEENT: Normal. Anicteric Neck: Supple, JVP to jaw cm. Carotids OK.  Cardiac:  Mechanical heart sounds with LVAD hum present.  Lungs:  CTAB, normal effort. Decreased at bases  Abdomen: Markedly distended, no HSM. No bruits or masses. +BS  LVAD exit site:  Dressing dry and intact. No erythema or drainage. Stabilization device present and accurately applied. Driveline dressing changed daily per sterile technique. Extremities:  Warm and dry. No cyanosis, clubbing, rash, trace edema Neuro: alert & oriented x 3, cranial nerves grossly intact. moves all 4 extremities w/o difficulty. Affect pleasant. No asterixis  ASSESSMENT AND PLAN: 1. Chronic end-stage biventricular systolic HF: LVEF 19% due to ICM -> s/p Echo 08/24/16 LVEF 15%, RV mild dilated, moderately reduced. 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  Has been turned down for transplant at [facility].  - Complicated by RV failure. Failed milrinone due to persistent RV failure/shock..   - Chronic NYHA IIIB-IV despite VAD support.  - Continue dobutamine 7.5 mcg and midodrine.  - Volume status stable on exam.  - Continue torsemide 100 mg BID + metolazone 2.5 every other day.  - Continue midodrine 10 TID for low MAPs and cirrhotic physiology.  - MAPs stable. - Off sildenafil due to hypotension - No beta blocker with RV failure.  - VAD interrogated personally. Parameters stable.   - Warfarin with goal INR 2.0 - 2.5 + ASA 81 daily.  - INR 2.36 today. Discussed dosing with PharmD personally.  - LDH pending - Driveline stable.  - Not transplant candidate due to social issues, noncompliance and cirrhosis  (has been turned down by [facility]: [facility]) 2. CAD: Severe 3v- CAD s/p CABG with occluded grafts as above except for LIMA.  - No s/s of ischemia.    - Continue current therapy.  3. DMII:  - Per PCP.  4. Tobacco abuse:  - He has stopped completely (in facility). No change.  5. Atrial fibrillation, paroxysmal - Regular by exam.  - Continue amio 200 daily.  Careful with cirrhosis  - No change to current plan.   6.  Anxiety/Depression - Continue Celexa 20mg  daily.  - Social issues still a concern, though now long-term SNF.  - No change to current plan.   7. Cirrhosis with hepatic encephalopathy - likely combination ETOH and chronic RV failure - No asterixis on exam today. Continues to have semi-frequent BMs on lactulose.  . Continue lactulose at 45 mg TID.  - Markedly distended on exam. .Dr. Haroldine Laws spoke to IR personally and has arranged paracentesis for today.  10. Anemia, iron-deficiency:   - Has had GI work up with EGD and Capsule Endoscopy showing severe gastropath  - Hgb 7.7 this am. Will need to follow. May need repeat transfusion. NT, ND, no HSM. No bruits or masses. +BS  11. DNR/DNI  Will place standing ordered for Lasix 120 mg IV with 40 meq of K for weights of greater or equal to 208 lbs. Paracentesis today.   [name] Friar, PA-C  10:36 AM  Patient seen and examined with the above-signed Advanced Practice Provider and/or Housestaff. I personally reviewed laboratory data, imaging studies and relevant notes. I independently examined the patient and formulated the important aspects of the plan. I have edited the note to reflect any of my changes or salient points. I have personally discussed the plan with the patient and/or family.  He continues to struggle with severe RV failure and cirrhosis. Remains on dobutamine and midodrine. Volume status up again despite very aggressive diuretic regimen. We are just unable to keep up with his fluid intake. Will plan repeat paracentesis today  (I discussed with IR personally). Will order standing IV lasix at [facility]. Continue Dobutamine. VAD interrogated personally. Parameters stable. INR 2.36. Discussed dosing with PharmD personally. Hgb dropping again. Will schedule for 1u RBCs next week. May need Aranesp.   Total time personally spent 45 minutes. Over half that time spent discussing above.   Glori Bickers, MD  10:22 PM

## 2018-04-28 NOTE — Procedures (Signed)
PROCEDURE SUMMARY:  Successful image-guided paracentesis from the left abdomen.  Yielded 3.6 liters of yellow fluid.  No immediate complications.  Patient tolerated well.   Specimen was not sent for labs.  Joaquim Nam PA-C 04/28/2018 1:48 PM

## 2018-04-28 NOTE — Patient Instructions (Addendum)
1. Standing order for Lasix 120 mg IV for wt equal to or above 208 lbs. Give additional 40 meq po potassium with IV Lasix. 2. Return to Short Stay on 05/04/18 at 08:00 am for 2 units blood. Arrive at admissions at 7:45 am.  3. Return to [place] clinic in 2 weeks on 05/11/18 at 10:00 am.

## 2018-04-28 NOTE — Progress Notes (Signed)
Pt presents to clinic for is hospital d/c follow up per transportation from [facility]. Pt in wheelchair, needs assistance to stand for weight and transfer to bed.   Assisted pt to BR x 3, pt reports he has sudden urgency and has to go quickly for loose stools. Pt is on lactulose.   Pt asking for "something cold to drink"; re-explained fluid limitations.   Since last visit, Ritta Slot NP has called twice to report wt gain due to pt not adhering to fluid restrictions. Says family brought glass from home and pt has been going in BR to fill with water. He also attended family birthday party over this past weekend and drank "extra". NP reported wt gain and edema on 04/21/18 and pt was given IV Lasix 120 mg with extra potassium per Dr. Aundra Dubin. NP reported wt gain and edema on 04/26/18 and pt was given IV Lasix 120 mg, extra dose of metolazone, and extra potassium.    Vital Signs:  Doppler Pressure: 86 Automatc BP:  103/72  HR:  SPO2:unable to obtain %  Weight:   lbs without eqt Last weight: 197 lbs - hospital d/c wt  VAD Indication: Destination Therapy   VAD interrogation & Equipment Management: Speed:5800 Flow: 5 Power:  w    PI:  Hct: 31  Alarms: several no external powers, pt states that he has been double disconnecting. Reminded him to only disconnect one power cable at a time.  Events: rare PI event   Fixed speed 5800 Low speed limit: 5500  Primary Controller:  Replace back up battery in  months. Back up controller:   Back up bag was left at [facility].  Annual Equipment Maintenance on UBC/PM was performed on 09/2017.   I reviewed the LVAD parameters from today and compared the results to the patient's prior recorded data. LVAD interrogation was NEGATIVE for significant power changes, NEGATIVE for clinical alarms and STABLE for PI events/speed drops. No programming changes were made and pump is functioning within specified parameters. Pt is performing daily controller  and system monitor self tests along with completing weekly and monthly maintenance for LVAD equipment.  LVAD equipment check completed and is in good working order. Back-up equipment not present today. Reminded patient he should keep black bag with him at all times.    Exit Site Care: VAD coordinators are managing driveline dressing along with [facility]. Dressing dry and intact with anchor intact and accurately applied. Dressing removed and site cleaned with sterile saline and betadine, velour fully implanted at exit site. Site with no redness, tenderness, drainage, or rash noted. Pt will need weekly dressing changes.  Supplied pt with 4 extra weekly dressing kits.   Significant Events on VAD Support:  11/30/16> Milrinone gtt at discharge 01/27/17>dobutamine at 5 mcg/kg/mingtt at discharge 03/08/17> RV failure 04/29/17> symptomatic anemia and melena. EGD revealed stomach ulcers. Carafate added; ASA stopped 05/31/17>Ongoing symptomatic anemia and melena; capsule study mild antral gastritis; poss tiny AVM right colon. Lower MAPS, Losartan cut back. 06/2017> admit with syncope, AMS, Dobutamine at 7.77mcg 11/09/17>Marked ascites, RV failure, and hepatic encephalopathy. Paracentesis on 2/7 and 2/12 with >11 liters fluid removed  Device:Protect Therapies: off; end of service 08/2016 Last check: complete interrogation 05/26/2017. DDD (lower rate 60); BiV pacing with 96% AS-VP  BP & Labs:  Doppler  - is reflecting modified systolic  Hgb  - No S/S of bleeding. Specifically denies melena/BRBPR or nosebleeds.  LDH stable at  with established baseline of 150- 250. Denies tea-colored urine. No power  elevations noted on interrogation.    Written instructions (below) sent to [facility].  Patient Instructions: 1. Standing order for Lasix 120 mg IV for wt equal to or above 208 lbs. Give additional 40 meq po potassium with IV Lasix. 2. Return to Short Stay on 05/04/18 at 08:00 am for 2 units blood. Arrive  at admissions at 7:45 am.  3. Return to [place] clinic in 2 weeks on 05/11/18 at 10:00 am.  Zada Girt RN [place] Coordinator   Office: [phone] 24/7 Emergency VAD Pager: [phone]

## 2018-04-28 NOTE — Progress Notes (Signed)
Pt presents to clinic for one week follow up per transportation from [facility]. Pt in wheelchair, needs assistance to stand for weight and transfer to bed.   Assisted pt to BR x 4, pt reports he has sudden urgency and has to go quickly for loose stools. Pt is on lactulose.   Pt asking for "something cold to drink"; re-explained fluid limitations.   Since last visit, Ritta Slot NP has called twice to report wt gain due to pt not adhering to fluid restrictions. Says family brought glass from home and pt has been going in BR to fill with water. He also attended family birthday party over this past weekend and drank "extra". NP reported wt gain and edema on 04/21/18 and pt was given IV Lasix 120 mg with extra potassium per Dr. Aundra Dubin. NP reported wt gain and edema on 04/26/18 and pt was given IV Lasix 120 mg, extra dose of metolazone, and extra potassium.   Pt will have abdominal paracentesis today (as work-in) per Dr. Kreg Shropshire request. Order placed and procedure scheduled.  At end of visit, took patient to Radiology waiting area, [facility] paperwork along with transportation contact # given to receptionist. IR nurse/PA has VAD Coordinator contact info.   Vital Signs:  Doppler Pressure: 86 Automatc BP:  103/72 (82) HR:  82 SPO2:unable to obtain  Weight: 212  lbs without eqt Last weight: 197 lbs - hospital d/c wt  VAD Indication: Destination Therapy   VAD interrogation & Equipment Management: Speed:5800 Flow: 5.3 Power:  4.5w    PI: 2.8 Hct: 31  Alarms: none Events: rare PI event   Fixed speed 5800 Low speed limit: 5500  Primary Controller:  Replace back up battery in 18 months. Back up controller:   Back up bag was left at [facility].  Annual Equipment Maintenance on UBC/PM was performed on 09/2017.   I reviewed the LVAD parameters from today and compared the results to the patient's prior recorded data. LVAD interrogation was NEGATIVE for significant power changes,  NEGATIVE for clinical alarms and STABLE for PI events/speed drops. No programming changes were made and pump is functioning within specified parameters. Pt is performing daily controller and system monitor self tests along with completing weekly and monthly maintenance for LVAD equipment.   Exit Site Care: VAD coordinators are managing driveline dressing along with [facility]. Dressing dry and intact with anchor intact and accurately applied. Dressing removed and site cleaned with sterile saline and betadine, velour fully implanted at exit site. Site with no redness, tenderness, drainage, or rash noted. Pt will need weekly dressing changes.  Supplied pt with 4 extra weekly dressing kits.   Significant Events on VAD Support:  11/30/16> Milrinone gtt at discharge 01/27/17>dobutamine at 5 mcg/kg/mingtt at discharge 03/08/17> RV failure 04/29/17> symptomatic anemia and melena. EGD revealed stomach ulcers. Carafate added; ASA stopped 05/31/17>Ongoing symptomatic anemia and melena; capsule study mild antral gastritis; poss tiny AVM right colon. Lower MAPS, Losartan cut back. 06/2017> admit with syncope, AMS, Dobutamine at 7.51mcg 11/09/17>Marked ascites, RV failure, and hepatic encephalopathy. Paracentesis on 2/7 and 2/12 with >11 liters fluid removed  Device:Protect Therapies: off; end of service 08/2016 Last check: complete interrogation 05/26/2017. DDD (lower rate 60); BiV pacing with 96% AS-VP  BP & Labs:  Doppler 86 - is reflecting modified systolic  Hgb 7.7 - No reported bleeding. Dr. Haroldine Laws notified of drop in Hgb.  LDH stable at 145 with established baseline of 150- 250. Denies tea-colored urine. No power elevations noted on interrogation.  Written instructions (below) sent to [facility].  Patient Instructions: 1. Standing order for Lasix 120 mg IV for weight greater than or equal to 208 lbs. With IV Lasix, give additional 40 meq of potassium. 2. Return to [place] clinic in two weeks on  Thurs, 05/11/18 at 10:00 am.   Zada Girt RN [place] Coordinator   Office: [phone] 24/7 Emergency VAD Pager: [phone]

## 2018-05-03 ENCOUNTER — Other Ambulatory Visit: Payer: Self-pay | Admitting: *Deleted

## 2018-05-03 DIAGNOSIS — D649 Anemia, unspecified: Secondary | ICD-10-CM

## 2018-05-03 DIAGNOSIS — E611 Iron deficiency: Secondary | ICD-10-CM

## 2018-05-03 DIAGNOSIS — Z95811 Presence of heart assist device: Secondary | ICD-10-CM

## 2018-05-04 ENCOUNTER — Ambulatory Visit (HOSPITAL_COMMUNITY)
Admission: RE | Admit: 2018-05-04 | Discharge: 2018-05-04 | Disposition: A | Discharge status: Home / Self Care | Payer: Medicare HMO | Source: Ambulatory Visit | Attending: Internal Medicine | Admitting: Internal Medicine

## 2018-05-04 ENCOUNTER — Ambulatory Visit (HOSPITAL_COMMUNITY): Payer: Self-pay | Admitting: Pharmacist

## 2018-05-04 DIAGNOSIS — D649 Anemia, unspecified: Secondary | ICD-10-CM | POA: Diagnosis present

## 2018-05-04 DIAGNOSIS — E611 Iron deficiency: Secondary | ICD-10-CM | POA: Diagnosis not present

## 2018-05-04 DIAGNOSIS — Z95811 Presence of heart assist device: Secondary | ICD-10-CM | POA: Insufficient documentation

## 2018-05-04 LAB — PREPARE RBC (CROSSMATCH)

## 2018-05-04 LAB — PROTIME-INR
INR: 2.46
PROTHROMBIN TIME: 26.4 s — AB (ref 11.4–15.2)

## 2018-05-04 MED ORDER — FUROSEMIDE 10 MG/ML IJ SOLN
120.0000 mg | Freq: Once | INTRAVENOUS | Status: AC
  Administered 2018-05-04: 120 mg via INTRAVENOUS
  Filled 2018-05-04: qty 12

## 2018-05-04 MED ORDER — SODIUM CHLORIDE 0.9% IV SOLUTION: Freq: Once | INTRAVENOUS | Status: DC

## 2018-05-04 NOTE — Progress Notes (Signed)
Patient has wounds to right lower extremity that are weeping clear fluid.  Wounds covered with 2-4x4 guaze and paper tape.  Patient instructed to follow up with PCP for wound treatment.

## 2018-05-05 LAB — BPAM RBC
BLOOD PRODUCT EXPIRATION DATE: 201908062359
BLOOD PRODUCT EXPIRATION DATE: 201909042359
ISSUE DATE / TIME: 201908010919
ISSUE DATE / TIME: 201908011317
UNIT TYPE AND RH: 9500
Unit Type and Rh: 5100

## 2018-05-05 LAB — TYPE AND SCREEN
ABO/RH(D): O POS
ANTIBODY SCREEN: NEGATIVE
UNIT DIVISION: 0
Unit division: 0

## 2018-05-08 ENCOUNTER — Emergency Department (HOSPITAL_COMMUNITY): Payer: Medicare HMO

## 2018-05-08 ENCOUNTER — Other Ambulatory Visit: Payer: Self-pay

## 2018-05-08 ENCOUNTER — Inpatient Hospital Stay (HOSPITAL_COMMUNITY)
Admission: EM | Admit: 2018-05-08 | Discharge: 2018-06-04 | DRG: 291 | Disposition: E | Payer: Medicare HMO | Attending: Internal Medicine | Admitting: Internal Medicine

## 2018-05-08 ENCOUNTER — Encounter (HOSPITAL_COMMUNITY): Payer: Self-pay | Admitting: Emergency Medicine

## 2018-05-08 ENCOUNTER — Inpatient Hospital Stay (HOSPITAL_COMMUNITY): Payer: Medicare HMO

## 2018-05-08 DIAGNOSIS — K7290 Hepatic failure, unspecified without coma: Secondary | ICD-10-CM | POA: Diagnosis present

## 2018-05-08 DIAGNOSIS — Z79890 Hormone replacement therapy (postmenopausal): Secondary | ICD-10-CM

## 2018-05-08 DIAGNOSIS — Z9981 Dependence on supplemental oxygen: Secondary | ICD-10-CM

## 2018-05-08 DIAGNOSIS — Z794 Long term (current) use of insulin: Secondary | ICD-10-CM

## 2018-05-08 DIAGNOSIS — I5082 Biventricular heart failure: Secondary | ICD-10-CM | POA: Diagnosis present

## 2018-05-08 DIAGNOSIS — R188 Other ascites: Secondary | ICD-10-CM | POA: Diagnosis present

## 2018-05-08 DIAGNOSIS — I5021 Acute systolic (congestive) heart failure: Secondary | ICD-10-CM

## 2018-05-08 DIAGNOSIS — I5023 Acute on chronic systolic (congestive) heart failure: Secondary | ICD-10-CM | POA: Diagnosis present

## 2018-05-08 DIAGNOSIS — E871 Hypo-osmolality and hyponatremia: Secondary | ICD-10-CM | POA: Diagnosis present

## 2018-05-08 DIAGNOSIS — R0681 Apnea, not elsewhere classified: Secondary | ICD-10-CM | POA: Diagnosis not present

## 2018-05-08 DIAGNOSIS — N183 Chronic kidney disease, stage 3 (moderate): Secondary | ICD-10-CM | POA: Diagnosis present

## 2018-05-08 DIAGNOSIS — I130 Hypertensive heart and chronic kidney disease with heart failure and stage 1 through stage 4 chronic kidney disease, or unspecified chronic kidney disease: Principal | ICD-10-CM | POA: Diagnosis present

## 2018-05-08 DIAGNOSIS — Z79899 Other long term (current) drug therapy: Secondary | ICD-10-CM

## 2018-05-08 DIAGNOSIS — I480 Paroxysmal atrial fibrillation: Secondary | ICD-10-CM | POA: Diagnosis present

## 2018-05-08 DIAGNOSIS — F419 Anxiety disorder, unspecified: Secondary | ICD-10-CM | POA: Diagnosis present

## 2018-05-08 DIAGNOSIS — E1122 Type 2 diabetes mellitus with diabetic chronic kidney disease: Secondary | ICD-10-CM | POA: Diagnosis present

## 2018-05-08 DIAGNOSIS — K7210 Chronic hepatic failure without coma: Secondary | ICD-10-CM | POA: Diagnosis not present

## 2018-05-08 DIAGNOSIS — Z515 Encounter for palliative care: Secondary | ICD-10-CM | POA: Diagnosis not present

## 2018-05-08 DIAGNOSIS — Z8249 Family history of ischemic heart disease and other diseases of the circulatory system: Secondary | ICD-10-CM

## 2018-05-08 DIAGNOSIS — R339 Retention of urine, unspecified: Secondary | ICD-10-CM | POA: Diagnosis not present

## 2018-05-08 DIAGNOSIS — J449 Chronic obstructive pulmonary disease, unspecified: Secondary | ICD-10-CM | POA: Diagnosis present

## 2018-05-08 DIAGNOSIS — Z95810 Presence of automatic (implantable) cardiac defibrillator: Secondary | ICD-10-CM

## 2018-05-08 DIAGNOSIS — E875 Hyperkalemia: Secondary | ICD-10-CM | POA: Diagnosis present

## 2018-05-08 DIAGNOSIS — Z66 Do not resuscitate: Secondary | ICD-10-CM | POA: Diagnosis present

## 2018-05-08 DIAGNOSIS — Z91048 Other nonmedicinal substance allergy status: Secondary | ICD-10-CM

## 2018-05-08 DIAGNOSIS — Z888 Allergy status to other drugs, medicaments and biological substances status: Secondary | ICD-10-CM

## 2018-05-08 DIAGNOSIS — Z7189 Other specified counseling: Secondary | ICD-10-CM

## 2018-05-08 DIAGNOSIS — K7460 Unspecified cirrhosis of liver: Secondary | ICD-10-CM | POA: Diagnosis present

## 2018-05-08 DIAGNOSIS — F1410 Cocaine abuse, uncomplicated: Secondary | ICD-10-CM | POA: Diagnosis present

## 2018-05-08 DIAGNOSIS — E7220 Disorder of urea cycle metabolism, unspecified: Secondary | ICD-10-CM | POA: Diagnosis present

## 2018-05-08 DIAGNOSIS — Z87891 Personal history of nicotine dependence: Secondary | ICD-10-CM

## 2018-05-08 DIAGNOSIS — I2510 Atherosclerotic heart disease of native coronary artery without angina pectoris: Secondary | ICD-10-CM | POA: Diagnosis present

## 2018-05-08 DIAGNOSIS — E876 Hypokalemia: Secondary | ICD-10-CM | POA: Diagnosis not present

## 2018-05-08 DIAGNOSIS — Z7901 Long term (current) use of anticoagulants: Secondary | ICD-10-CM

## 2018-05-08 DIAGNOSIS — F329 Major depressive disorder, single episode, unspecified: Secondary | ICD-10-CM | POA: Diagnosis present

## 2018-05-08 DIAGNOSIS — Z951 Presence of aortocoronary bypass graft: Secondary | ICD-10-CM | POA: Diagnosis not present

## 2018-05-08 DIAGNOSIS — D631 Anemia in chronic kidney disease: Secondary | ICD-10-CM | POA: Diagnosis present

## 2018-05-08 DIAGNOSIS — Z95811 Presence of heart assist device: Secondary | ICD-10-CM | POA: Diagnosis not present

## 2018-05-08 DIAGNOSIS — Z885 Allergy status to narcotic agent status: Secondary | ICD-10-CM

## 2018-05-08 DIAGNOSIS — E7800 Pure hypercholesterolemia, unspecified: Secondary | ICD-10-CM | POA: Diagnosis present

## 2018-05-08 DIAGNOSIS — Z833 Family history of diabetes mellitus: Secondary | ICD-10-CM

## 2018-05-08 HISTORY — PX: IR PARACENTESIS: IMG2679

## 2018-05-08 LAB — CBC WITH DIFFERENTIAL/PLATELET
Abs Immature Granulocytes: 0 10*3/uL (ref 0.0–0.1)
BASOS ABS: 0 10*3/uL (ref 0.0–0.1)
BASOS PCT: 0 %
Eosinophils Absolute: 0.1 10*3/uL (ref 0.0–0.7)
Eosinophils Relative: 1 %
HCT: 31.7 % — ABNORMAL LOW (ref 39.0–52.0)
HEMOGLOBIN: 9 g/dL — AB (ref 13.0–17.0)
Immature Granulocytes: 0 %
Lymphocytes Relative: 5 %
Lymphs Abs: 0.4 10*3/uL — ABNORMAL LOW (ref 0.7–4.0)
MCH: 22.4 pg — AB (ref 26.0–34.0)
MCHC: 28.4 g/dL — ABNORMAL LOW (ref 30.0–36.0)
MCV: 79.1 fL (ref 78.0–100.0)
MONO ABS: 0.7 10*3/uL (ref 0.1–1.0)
MONOS PCT: 9 %
Neutro Abs: 6.9 10*3/uL (ref 1.7–7.7)
Neutrophils Relative %: 85 %
Platelets: 173 10*3/uL (ref 150–400)
RBC: 4.01 MIL/uL — ABNORMAL LOW (ref 4.22–5.81)
RDW: 19.4 % — ABNORMAL HIGH (ref 11.5–15.5)
WBC: 8.1 10*3/uL (ref 4.0–10.5)

## 2018-05-08 LAB — BASIC METABOLIC PANEL
ANION GAP: 10 (ref 5–15)
BUN: 44 mg/dL — ABNORMAL HIGH (ref 8–23)
CALCIUM: 8.7 mg/dL — AB (ref 8.9–10.3)
CHLORIDE: 87 mmol/L — AB (ref 98–111)
CO2: 29 mmol/L (ref 22–32)
CREATININE: 1.65 mg/dL — AB (ref 0.61–1.24)
GFR calc non Af Amer: 43 mL/min — ABNORMAL LOW (ref >60)
GFR, EST AFRICAN AMERICAN: 50 mL/min — AB (ref >60)
GLUCOSE: 169 mg/dL — AB (ref 70–99)
Potassium: 4.9 mmol/L (ref 3.5–5.1)
Sodium: 126 mmol/L — ABNORMAL LOW (ref 135–145)

## 2018-05-08 LAB — COMPREHENSIVE METABOLIC PANEL
ALK PHOS: 102 U/L (ref 38–126)
ALT: 22 U/L (ref 0–44)
ANION GAP: 11 (ref 5–15)
AST: 31 U/L (ref 15–41)
Albumin: 3.4 g/dL — ABNORMAL LOW (ref 3.5–5.0)
BUN: 42 mg/dL — ABNORMAL HIGH (ref 8–23)
CALCIUM: 8.8 mg/dL — AB (ref 8.9–10.3)
CO2: 25 mmol/L (ref 22–32)
CREATININE: 1.66 mg/dL — AB (ref 0.61–1.24)
Chloride: 90 mmol/L — ABNORMAL LOW (ref 98–111)
GFR, EST AFRICAN AMERICAN: 50 mL/min — AB (ref >60)
GFR, EST NON AFRICAN AMERICAN: 43 mL/min — AB (ref >60)
Glucose, Bld: 188 mg/dL — ABNORMAL HIGH (ref 70–99)
Potassium: 5.7 mmol/L — ABNORMAL HIGH (ref 3.5–5.1)
SODIUM: 126 mmol/L — AB (ref 135–145)
Total Bilirubin: 1.1 mg/dL (ref 0.3–1.2)
Total Protein: 7.1 g/dL (ref 6.5–8.1)

## 2018-05-08 LAB — GLUCOSE, CAPILLARY
Glucose-Capillary: 167 mg/dL — ABNORMAL HIGH (ref 70–99)
Glucose-Capillary: 164 mg/dL — ABNORMAL HIGH (ref 70–99)
Glucose-Capillary: 222 mg/dL — ABNORMAL HIGH (ref 70–99)

## 2018-05-08 LAB — TYPE AND SCREEN
ABO/RH(D): O POS
Antibody Screen: NEGATIVE

## 2018-05-08 LAB — AMMONIA: Ammonia: 81 umol/L — ABNORMAL HIGH (ref 9–35)

## 2018-05-08 LAB — MRSA PCR SCREENING: MRSA by PCR: NEGATIVE

## 2018-05-08 LAB — PROTIME-INR
INR: 2.79
PROTHROMBIN TIME: 29.2 s — AB (ref 11.4–15.2)

## 2018-05-08 LAB — TROPONIN I: Troponin I: <0.03 ng/mL (ref <0.03)

## 2018-05-08 LAB — CBG MONITORING, ED: Glucose-Capillary: 184 mg/dL — ABNORMAL HIGH (ref 70–99)

## 2018-05-08 LAB — LACTATE DEHYDROGENASE: LDH: 185 U/L (ref 98–192)

## 2018-05-08 LAB — BRAIN NATRIURETIC PEPTIDE: B Natriuretic Peptide: 1616.1 pg/mL — ABNORMAL HIGH (ref 0.0–100.0)

## 2018-05-08 MED ORDER — INSULIN ASPART 100 UNIT/ML ~~LOC~~ SOLN
0.0000 [IU] | Freq: Three times a day (TID) | SUBCUTANEOUS | Status: DC
  Administered 2018-05-08: 3 [IU] via SUBCUTANEOUS
  Administered 2018-05-08 – 2018-05-09 (×3): 4 [IU] via SUBCUTANEOUS
  Administered 2018-05-10 – 2018-05-11 (×3): 3 [IU] via SUBCUTANEOUS
  Administered 2018-05-11: 7 [IU] via SUBCUTANEOUS
  Administered 2018-05-12: 4 [IU] via SUBCUTANEOUS
  Administered 2018-05-12: 7 [IU] via SUBCUTANEOUS
  Administered 2018-05-13: 3 [IU] via SUBCUTANEOUS
  Administered 2018-05-13: 4 [IU] via SUBCUTANEOUS
  Administered 2018-05-13: 3 [IU] via SUBCUTANEOUS
  Administered 2018-05-14: 4 [IU] via SUBCUTANEOUS
  Administered 2018-05-14: 11 [IU] via SUBCUTANEOUS
  Administered 2018-05-14: 4 [IU] via SUBCUTANEOUS
  Administered 2018-05-15: 3 [IU] via SUBCUTANEOUS
  Administered 2018-05-15 (×2): 4 [IU] via SUBCUTANEOUS
  Filled 2018-05-08: qty 1

## 2018-05-08 MED ORDER — ONDANSETRON HCL 4 MG/2ML IJ SOLN
4.0000 mg | Freq: Four times a day (QID) | INTRAMUSCULAR | Status: DC | PRN
  Administered 2018-05-10 – 2018-05-17 (×2): 4 mg via INTRAVENOUS
  Filled 2018-05-08: qty 2

## 2018-05-08 MED ORDER — FUROSEMIDE 10 MG/ML IJ SOLN: 80.0000 mg | INTRAMUSCULAR | Status: DC

## 2018-05-08 MED ORDER — ACETAMINOPHEN 325 MG PO TABS
650.0000 mg | ORAL_TABLET | ORAL | Status: DC | PRN
  Administered 2018-05-15: 650 mg via ORAL
  Filled 2018-05-08: qty 2

## 2018-05-08 MED ORDER — SENNOSIDES-DOCUSATE SODIUM 8.6-50 MG PO TABS: 2.0000 | ORAL_TABLET | Freq: Every evening | ORAL | Status: DC | PRN

## 2018-05-08 MED ORDER — LIDOCAINE HCL 2 % IJ SOLN
INTRAMUSCULAR | Status: AC | PRN
  Administered 2018-05-08: 10 mL

## 2018-05-08 MED ORDER — DOBUTAMINE IN D5W 4-5 MG/ML-% IV SOLN
7.5000 ug/kg/min | INTRAVENOUS | Status: DC
  Administered 2018-05-08 – 2018-05-16 (×9): 7.5 ug/kg/min via INTRAVENOUS
  Filled 2018-05-08 (×8): qty 250

## 2018-05-08 MED ORDER — INSULIN ASPART 100 UNIT/ML FLEXPEN: 5.0000 [IU] | PEN_INJECTOR | Freq: Three times a day (TID) | SUBCUTANEOUS | Status: DC

## 2018-05-08 MED ORDER — FUROSEMIDE 10 MG/ML IJ SOLN
80.0000 mg | Freq: Once | INTRAMUSCULAR | Status: AC
  Administered 2018-05-08: 80 mg via INTRAVENOUS
  Filled 2018-05-08: qty 8

## 2018-05-08 MED ORDER — DOBUTAMINE IN D5W 4-5 MG/ML-% IV SOLN: 7.5000 ug/kg/min | INTRAVENOUS | Status: DC

## 2018-05-08 MED ORDER — LACTULOSE 10 GM/15ML PO SOLN
45.0000 g | Freq: Three times a day (TID) | ORAL | Status: DC
  Administered 2018-05-08 – 2018-05-15 (×23): 45 g via ORAL
  Filled 2018-05-08 (×25): qty 90

## 2018-05-08 MED ORDER — SUCRALFATE 1 GM/10ML PO SUSP
1.0000 g | Freq: Three times a day (TID) | ORAL | Status: DC
  Administered 2018-05-08 – 2018-05-15 (×31): 1 g via ORAL
  Filled 2018-05-08 (×32): qty 10

## 2018-05-08 MED ORDER — MIDODRINE HCL 5 MG PO TABS
10.0000 mg | ORAL_TABLET | Freq: Three times a day (TID) | ORAL | Status: DC
  Administered 2018-05-08 – 2018-05-12 (×14): 10 mg via ORAL
  Administered 2018-05-13: 15 mg via ORAL
  Filled 2018-05-08 (×15): qty 2

## 2018-05-08 MED ORDER — MAGNESIUM OXIDE 400 (241.3 MG) MG PO TABS
400.0000 mg | ORAL_TABLET | Freq: Every day | ORAL | Status: DC
  Administered 2018-05-08 – 2018-05-15 (×8): 400 mg via ORAL
  Filled 2018-05-08 (×8): qty 1

## 2018-05-08 MED ORDER — DOCUSATE SODIUM 100 MG PO CAPS: 100.0000 mg | ORAL_CAPSULE | Freq: Every evening | ORAL | Status: DC | PRN

## 2018-05-08 MED ORDER — FUROSEMIDE 10 MG/ML IJ SOLN
80.0000 mg | Freq: Three times a day (TID) | INTRAMUSCULAR | Status: DC
  Administered 2018-05-08 – 2018-05-10 (×6): 80 mg via INTRAVENOUS
  Filled 2018-05-08 (×8): qty 8

## 2018-05-08 MED ORDER — LEVOTHYROXINE SODIUM 50 MCG PO TABS
50.0000 ug | ORAL_TABLET | Freq: Every day | ORAL | Status: DC
  Administered 2018-05-09 – 2018-05-16 (×8): 50 ug via ORAL
  Filled 2018-05-08 (×8): qty 1

## 2018-05-08 MED ORDER — WARFARIN - PHARMACIST DOSING INPATIENT
Freq: Every day | Status: DC
  Administered 2018-05-08: 18:00:00
  Administered 2018-05-15: 1

## 2018-05-08 MED ORDER — ONDANSETRON HCL 4 MG PO TABS: 4.0000 mg | ORAL_TABLET | Freq: Three times a day (TID) | ORAL | Status: DC | PRN

## 2018-05-08 MED ORDER — MUPIROCIN CALCIUM 2 % EX CREA: TOPICAL_CREAM | Freq: Two times a day (BID) | CUTANEOUS | Status: DC

## 2018-05-08 MED ORDER — PROMETHAZINE HCL 25 MG/ML IJ SOLN
12.5000 mg | Freq: Once | INTRAMUSCULAR | Status: AC
  Administered 2018-05-08: 12.5 mg via INTRAVENOUS
  Filled 2018-05-08: qty 1

## 2018-05-08 MED ORDER — WARFARIN SODIUM 4 MG PO TABS
4.0000 mg | ORAL_TABLET | Freq: Once | ORAL | Status: AC
  Administered 2018-05-08: 4 mg via ORAL
  Filled 2018-05-08: qty 1

## 2018-05-08 MED ORDER — AMIODARONE HCL 200 MG PO TABS
200.0000 mg | ORAL_TABLET | Freq: Every day | ORAL | Status: DC
  Administered 2018-05-08 – 2018-05-15 (×8): 200 mg via ORAL
  Filled 2018-05-08 (×8): qty 1

## 2018-05-08 MED ORDER — MUPIROCIN CALCIUM 2 % EX CREA
TOPICAL_CREAM | Freq: Every day | CUTANEOUS | Status: DC
  Administered 2018-05-09: 1 via TOPICAL
  Administered 2018-05-10: 14:00:00 via TOPICAL
  Administered 2018-05-11: 1 via TOPICAL
  Administered 2018-05-12 – 2018-05-15 (×4): via TOPICAL
  Filled 2018-05-08 (×2): qty 15

## 2018-05-08 MED ORDER — ONDANSETRON 4 MG PO TBDP: 4.0000 mg | ORAL_TABLET | Freq: Three times a day (TID) | ORAL | Status: DC | PRN

## 2018-05-08 MED ORDER — PANTOPRAZOLE SODIUM 40 MG PO TBEC
40.0000 mg | DELAYED_RELEASE_TABLET | Freq: Every day | ORAL | Status: DC
  Administered 2018-05-08 – 2018-05-15 (×8): 40 mg via ORAL
  Filled 2018-05-08 (×8): qty 1

## 2018-05-08 MED ORDER — INSULIN ASPART 100 UNIT/ML ~~LOC~~ SOLN
5.0000 [IU] | Freq: Three times a day (TID) | SUBCUTANEOUS | Status: DC
  Administered 2018-05-08 – 2018-05-15 (×22): 5 [IU] via SUBCUTANEOUS

## 2018-05-08 MED ORDER — LIDOCAINE HCL (PF) 2 % IJ SOLN
INTRAMUSCULAR | Status: AC
  Filled 2018-05-08: qty 20

## 2018-05-08 MED ORDER — DOBUTAMINE IN D5W 4-5 MG/ML-% IV SOLN
7.5000 ug/kg/min | INTRAVENOUS | Status: DC
  Administered 2018-05-08: 7.5 ug/kg/min via INTRAVENOUS

## 2018-05-08 MED ORDER — GABAPENTIN 300 MG PO CAPS
300.0000 mg | ORAL_CAPSULE | Freq: Two times a day (BID) | ORAL | Status: DC
  Administered 2018-05-08 – 2018-05-15 (×16): 300 mg via ORAL
  Filled 2018-05-08 (×16): qty 1

## 2018-05-08 MED ORDER — SODIUM BICARBONATE 8.4 % IV SOLN
50.0000 meq | Freq: Once | INTRAVENOUS | Status: AC
  Administered 2018-05-08: 50 meq via INTRAVENOUS
  Filled 2018-05-08: qty 50

## 2018-05-08 NOTE — ED Triage Notes (Signed)
Patient arrived with EMS from [facility] reports worsening SOB with chest congestion /productive cough , abdominal edema for several days , history of heart failure with LVAD / Dobutamine IV drip . Denies fever or chills  .

## 2018-05-08 NOTE — ED Notes (Signed)
Attempted report x1. 

## 2018-05-08 NOTE — Progress Notes (Signed)
Assisted with transport from ED to 2c15. No issues during transport

## 2018-05-08 NOTE — Procedures (Signed)
PROCEDURE SUMMARY:  Successful image-guided paracentesis from the right abdomen.  Yielded 3.6 liters of clear yellow fluid.  No immediate complications.  Patient tolerated well.   Specimen was not sent for labs.  Joaquim Nam PA-C 05/07/2018 2:43 PM

## 2018-05-08 NOTE — Progress Notes (Signed)
ANTICOAGULATION CONSULT NOTE - Initial Consult  Pharmacy Consult for warfarin Indication: LVAD  Allergies  Allergen Reactions  . Chlorhexidine Gluconate Itching and Rash  . Codeine Nausea And Vomiting and Other (See Comments)    In high doses  . Lipitor [Atorvastatin] Nausea Only and Other (See Comments)    Nausea with high doses, tolerates 20mg  dose (08/22/16)  . Tape Other (See Comments)    Paper tape is ok    Patient Measurements: Height: 5\' 9"  (175.3 cm) Weight: 209 lb (94.8 kg) IBW/kg (Calculated) : 70.7  Vital Signs: Temp: 97.9 F (36.6 C) (08/05 0428) Temp Source: Oral (08/05 0428) Pulse Rate: 79 (08/05 0700)  Labs: Recent Labs    05/21/2018 0441  HGB 9.0*  HCT 31.7*  PLT 173  LABPROT 29.2*  INR 2.79  CREATININE 1.66*  TROPONINI <0.03    Estimated Creatinine Clearance: 53.1 mL/min (A) (by C-G formula based on SCr of 1.66 mg/dL (H)).   Medical History: Past Medical History:  Diagnosis Date  . AICD (automatic cardioverter/defibrillator) present   . Anxiety   . ASCVD (arteriosclerotic cardiovascular disease)   . Chronic systolic CHF (congestive heart failure) ([name])   . COPD (chronic obstructive pulmonary disease) ([place])   . Coronary artery disease   . Depression   . GI bleed   . High cholesterol   . History of blood transfusion   . History of cocaine abuse   . Hypertension   . LVAD (left ventricular assist device) present ([place])   . Presence of permanent cardiac pacemaker   . Shortness of breath dyspnea   . Suicidal ideation   . Tobacco abuse   . Type II diabetes mellitus (HCC)      Assessment: 31 yoM s/p LVAD for ICM presents with SOB. Pharmacy consulted to resumed warfarin, last dose 8/4. INR slightly supratherapeutic on admit at 2.79 - will give slightly reduced warfarin dose tonight.  *PTA Warfarin Dose = 5mg  Mon/Wed/Fri, 7.5mg  Tues/Thurs/Sat/Sun  Goal of Therapy:  INR 1.8-2.4 Monitor platelets by anticoagulation protocol: Yes   Plan:   -Warfarin 4mg  PO x1 -Daily protime  Arrie Senate, PharmD, BCPS Clinical Pharmacist [phone] Please check AMION for all [place] numbers 05/15/2018

## 2018-05-08 NOTE — H&P (Addendum)
Advanced Heart Failure VAD History and Physical Note   PCP-Cardiologist: Glori Bickers, MD   Reason for Admission:A/C Systolic Heart Failure   HPI:   Ronald Miller. is a 62 y.o. male with history of chronic systolic HF s/p Medtronic ICD 2014, CAD s/p CABG in 2000, HTN, Hx of cocaine abuse, Tobacco abuse, Depression, PAF, and COPD. HM3 placed 09/09/2016. 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 In April 2018 he was placed on milrinone but later switched to dobutamine. He remains on dobutamine 7.18mcg.    Admitted February, April, May, and June with RHF and ascites. Last discharge was 7/6 back to [facility].  Discharge weight was 203 pounds   RV Failure  01/2017 On milrinone and transitioned to dobutamine   GI Events 04/2017 - EGD with gastritis.  06/01/2017- Capsule Endoscopy- normal except  tiny angiectasia, nonbleeding in the right colon.  On 8/1 he received 2UPRBCs for hgb 7.7. Blood was given in short stay.    Yesterday he was in his usual state. His sister says he had gained some weight but seemed to be doing ok. Earlier this morning dobutamine pump was alarming. SNF called [facility] and they were instructed to call EMS.   ED course- Dobutamine pump was noted to be clamped on arrival. Pertinent labs: BNP 1616, K 5.7, sodium 126, creatinine 1.66, Hgb 9, WBC 8.1, LDH 185, and INR 2.79.  CXR with vascular congestion. Dobutamine pume K 5.7--> given 80 mg IV lasix and one dose sodium bicarb. He was complaining of nausea. He was given IV phenergan.    LVAD INTERROGATION:  HeartMate II LVAD:  Flow 5.4  liters/min, speed 5800, power 4.4, PI 2.7  No external  Power 8/5 and 8/1      Review of Systems: [y] = yes, [ ]  = no   General: Weight gain [Y ]; Weight loss [ ] ; Anorexia [ ] ; Fatigue [Y ]; Fever [ ] ; Chills [ ] ; Weakness [ ]   Cardiac: Chest pain/pressure [ ] ; Resting SOB [ ] ; Exertional SOB [ Y]; Orthopnea [Y ]; Pedal Edema [ Y]; Palpitations [ ] ;  Syncope [ ] ; Presyncope [ ] ; Paroxysmal nocturnal dyspnea[ ]   Pulmonary: Cough [ ] ; Wheezing[ ] ; Hemoptysis[ ] ; Sputum [ ] ; Snoring [ ]   GI: Vomiting[ ] ; Dysphagia[ ] ; Melena[ ] ; Hematochezia [ ] ; Heartburn[ ] ; Abdominal pain [ ] ; Constipation [ ] ; Diarrhea [ ] ; BRBPR [ ]   GU: Hematuria[ ] ; Dysuria [ ] ; Nocturia[ ]   Vascular: Pain in legs with walking [ ] ; Pain in feet with lying flat [ ] ; Non-healing sores [ ] ; Stroke [Y ]; TIA [ ] ; Slurred speech [ ] ;  Neuro: Headaches[ ] ; Vertigo[ ] ; Seizures[ ] ; Paresthesias[ ] ;Blurred vision [ ] ; Diplopia [ ] ; Vision changes [ ]   Ortho/Skin: Arthritis [ ] ; Joint pain [Y ]; Muscle pain [ ] ; Joint swelling [ ] ; Back Pain [ ] ; Rash [ ]   Psych: Depression[ ] ; Anxiety[ ]   Heme: Bleeding problems [ ] ; Clotting disorders [ ] ; Anemia [ Y]  Endocrine: Diabetes [Y ]; Thyroid dysfunction[Y ]    Home Medications Prior to Admission medications   Medication Sig Start Date End Date Taking? Authorizing Provider  acetaminophen (TYLENOL) 500 MG tablet Take 1,000 mg by mouth every 6 (six) hours as needed for moderate pain.     [provider]  amiodarone (PACERONE) 200 MG tablet TAKE ONE TABLET BY MOUTH ONCE DAILY 10/31/17   Bensimhon, Shaune Pascal, MD  Cholecalciferol (VITAMIN D3) 3000 units  TABS Take 3,000 Units by mouth daily.    [provider]  citalopram (CELEXA) 20 MG tablet Take 1 tablet (20 mg total) by mouth daily. Patient taking differently: Take 10 mg by mouth daily.  05/09/17   Clegg, Amy D, NP  cyclobenzaprine (FLEXERIL) 10 MG tablet Take 1 tablet (10 mg total) by mouth 3 (three) times daily as needed for muscle spasms. 11/30/17   Larey Dresser, MD  DOBUTamine (DOBUTREX) 4-5 MG/ML-% infusion Inject 714.75 mcg/min into the vein continuous. PER [facility] pharmacy 11/21/17   Darrick Grinder D, NP  docusate sodium (COLACE) 100 MG capsule Take 100 mg by mouth at bedtime as needed (constipation).     [provider]  gabapentin (NEURONTIN) 300 MG capsule  Take 1 capsule (300 mg total) by mouth 2 (two) times daily. 09/30/17   Bensimhon, Shaune Pascal, MD  insulin aspart (NOVOLOG FLEXPEN) 100 UNIT/ML FlexPen Inject 5 Units into the skin 3 (three) times daily with meals. 05/09/17   Clegg, Amy D, NP  Insulin Detemir (LEVEMIR FLEXTOUCH) 100 UNIT/ML Pen Inject 10 Units into the skin at bedtime.    [provider]  lactulose (CHRONULAC) 10 GM/15ML solution Take 67.5 mLs (45 g total) by mouth 3 (three) times daily. 01/27/18   Clegg, Amy D, NP  levothyroxine (SYNTHROID, LEVOTHROID) 50 MCG tablet Take 1 tablet (50 mcg total) by mouth daily before breakfast. 03/03/18   Georgiana Shore, NP  magnesium oxide (MAG-OX) 400 (241.3 Mg) MG tablet Take 1 tablet (400 mg total) by mouth daily. 05/09/17   Clegg, Amy D, NP  metolazone (ZAROXOLYN) 2.5 MG tablet Take 1 tablet (2.5 mg total) by mouth every other day. 04/08/18   Georgiana Shore, NP  midodrine (PROAMATINE) 10 MG tablet Take 1 tablet (10 mg total) by mouth 3 (three) times daily with meals. 11/21/17   Clegg, Amy D, NP  Multiple Vitamin (MULTIVITAMIN WITH MINERALS) TABS tablet Take 1 tablet by mouth daily. Thera-M    [provider]  ondansetron (ZOFRAN ODT) 4 MG disintegrating tablet Take 1 tablet (4 mg total) by mouth every 8 (eight) hours as needed for nausea. 04/10/17   Tanna Furry, MD  ondansetron (ZOFRAN) 4 MG tablet Take 4 mg by mouth every 8 (eight) hours as needed for nausea or vomiting.    [provider]  OXYGEN Inhale 2 L into the lungs as needed (for SATS below 90%).    [provider]  pantoprazole (PROTONIX) 40 MG tablet TAKE 1 TABLET BY MOUTH TWICE DAILY WITH A MEAL 01/05/18   Bensimhon, Shaune Pascal, MD  potassium chloride SA (K-DUR,KLOR-CON) 20 MEQ tablet Take 2 tablets (40 mEq total) by mouth 2 (two) times daily. Give extra 40 meq on days pt receives prn IV Lasix 04/28/18   Bensimhon, Shaune Pascal, MD  senna-docusate (SENOKOT S) 8.6-50 MG tablet Take 2 tablets by mouth at bedtime as needed  for mild constipation. 05/09/17   Darrick Grinder D, NP  Skin Protectants, Misc. (MINERIN) CREA Apply 1 application topically See admin instructions. Apply to legs daily for dry skin    [provider]  spironolactone (ALDACTONE) 25 MG tablet Take 1 tablet (25 mg total) by mouth daily. 01/27/18   Clegg, Amy D, NP  sucralfate (CARAFATE) 1 GM/10ML suspension Take 10 mLs (1 g total) by mouth 4 (four) times daily -  with meals and at bedtime. 05/09/17   Clegg, Amy D, NP  torsemide (DEMADEX) 100 MG tablet Take 1 tablet (100 mg  total) by mouth 2 (two) times daily. Take 100mg  in the morning and 50mg  in the evening 03/28/18   Bensimhon, Shaune Pascal, MD  traZODone (DESYREL) 50 MG tablet Take 1 tablet (50 mg total) by mouth at bedtime as needed for sleep. 12/20/17   [name] Friar, PA-C  warfarin (COUMADIN) 5 MG tablet Take 5mg  (1tab) Monday, Wednesday and Friday, take 7.5mg  (1.5tab) Tuesday, Thursday, Saturday and Sunday 03/13/18   Bensimhon, Shaune Pascal, MD    Past Medical History: Past Medical History:  Diagnosis Date  . AICD (automatic cardioverter/defibrillator) present   . Anxiety   . ASCVD (arteriosclerotic cardiovascular disease)   . Chronic systolic CHF (congestive heart failure) ([place])   . COPD (chronic obstructive pulmonary disease) ([place])   . Coronary artery disease   . Depression   . GI bleed   . High cholesterol   . History of blood transfusion   . History of cocaine abuse   . Hypertension   . LVAD (left ventricular assist device) present ([place])   . Presence of permanent cardiac pacemaker   . Shortness of breath dyspnea   . Suicidal ideation   . Tobacco abuse   . Type II diabetes mellitus ([place])     Past Surgical History: Past Surgical History:  Procedure Laterality Date  . BACK SURGERY    . CARDIAC CATHETERIZATION N/A 02/12/2016   Procedure: Right/Left Heart Cath and Coronary/Graft Angiography;  Surgeon: Jolaine Artist, MD;  Location: [place] [facility];  Service:  Cardiovascular;  Laterality: N/A;  . CARDIAC CATHETERIZATION N/A 03/22/2016   Procedure: Right Heart Cath;  Surgeon: Jolaine Artist, MD;  Location: [place] [facility];  Service: Cardiovascular;  Laterality: N/A;  . CARDIAC CATHETERIZATION N/A 08/25/2016   Procedure: Right Heart Cath;  Surgeon: Jolaine Artist, MD;  Location: [place] [facility];  Service: Cardiovascular;  Laterality: N/A;  . CARDIAC CATHETERIZATION N/A 09/03/2016   Procedure: Right Heart Cath;  Surgeon: Jolaine Artist, MD;  Location: [place] [facility];  Service: Cardiovascular;  Laterality: N/A;  . CARDIAC DEFIBRILLATOR PLACEMENT    . CARDIAC DEFIBRILLATOR PLACEMENT    . CHOLECYSTECTOMY N/A 08/30/2015   Procedure: LAPAROSCOPIC CHOLECYSTECTOMY;  Surgeon: Hubbard Robinson, MD;  Location: [name];  Service: General;  Laterality: N/A;  . COLONOSCOPY WITH PROPOFOL N/A 08/18/2016   Procedure: COLONOSCOPY WITH PROPOFOL;  Surgeon: Jerene Bears, MD;  Location: [place];  Service: Gastroenterology;  Laterality: N/A;  . CORONARY ARTERY BYPASS GRAFT  2001  . ENTEROSCOPY N/A 05/02/2017   Procedure: ENTEROSCOPY;  Surgeon: Mauri Pole, MD;  Location: [facility] ENDOSCOPY;  Service: Endoscopy;  Laterality: N/A;  . GIVENS CAPSULE STUDY N/A 06/01/2017   Procedure: GIVENS CAPSULE STUDY;  Surgeon: Jerene Bears, MD;  Location: [place];  Service: Gastroenterology;  Laterality: N/A;  . INSERT / REPLACE / REMOVE PACEMAKER    . INSERTION OF IMPLANTABLE LEFT VENTRICULAR ASSIST DEVICE N/A 09/09/2016   Procedure: INSERTION OF IMPLANTABLE LEFT VENTRICULAR ASSIST DEVICE;  Surgeon: [name], MD;  Location: [place];  Service: Open Heart Surgery;  Laterality: N/A;  HeartMate 3 CIRC ARREST  NITRIC OXIDE  . IR FLUORO GUIDE CV LINE RIGHT  01/28/2017  . IR FLUORO GUIDE CV LINE RIGHT  01/26/2018  . IR GENERIC HISTORICAL  08/03/2016   IR FLUORO GUIDE CV LINE RIGHT 08/03/2016 [name], MD [facility]  . IR GENERIC HISTORICAL   12/14/2016   IR FLUORO GUIDE CV LINE RIGHT 12/14/2016 [name],  MD [facility]  . IR PARACENTESIS  11/10/2017  . IR PARACENTESIS  11/15/2017  . IR PARACENTESIS  12/27/2017  . IR PARACENTESIS  01/11/2018  . IR PARACENTESIS  01/16/2018  . IR PARACENTESIS  02/15/2018  . IR PARACENTESIS  02/23/2018  . IR PARACENTESIS  04/03/2018  . IR PARACENTESIS  04/07/2018  . IR PARACENTESIS  04/28/2018  . IR US GUIDE VASC ACCESS RIGHT  01/28/2017  . IR US GUIDE VASC ACCESS RIGHT  01/26/2018  . LUMBAR DISC SURGERY  02/1999   Discectomy and fusion  . RIGHT HEART CATH N/A 02/11/2017   Procedure: Right Heart Cath;  Surgeon: Jolaine Artist, MD;  Location: [place] [facility];  Service: Cardiovascular;  Laterality: N/A;  . TEE WITHOUT CARDIOVERSION N/A 09/09/2016   Procedure: TRANSESOPHAGEAL ECHOCARDIOGRAM (TEE);  Surgeon: [name], MD;  Location: [place];  Service: Open Heart Surgery;  Laterality: N/A;  . VASECTOMY     Subsequent reversal    Family History: Family History  Problem Relation Age of Onset  . Arthritis Mother   . Hyperlipidemia Mother   . CAD Father   . Diabetes Father   . Heart failure Father   . Alcohol abuse Father   . Hyperlipidemia Father   . Hyperlipidemia Maternal Grandmother   . Hyperlipidemia Maternal Grandfather   . Hyperlipidemia Paternal Grandmother   . Hyperlipidemia Paternal Grandfather     Social History: Social History   Socioeconomic History  . Marital status: Divorced    Spouse name: Not on file  . Number of children: 1  . Years of education: 76  . Highest education level: Not on file  Occupational History  . Occupation: Retired  . Occupation: [employer] operational officer  . Financial resource strain: Not on file  . Food insecurity:    Worry: Not on file    Inability: Not on file  . Transportation needs:    Medical: Not on file    Non-medical: Not on file  Tobacco Use  . Smoking status: Former Smoker    Packs/day: 1.50    Years: 23.00    Pack  years: 34.50    Types: Cigarettes    Start date: 10/24/1992    Last attempt to quit: 07/01/2016    Years since quitting: 1.8  . Smokeless tobacco: Never Used  Substance and Sexual Activity  . Alcohol use: No    Alcohol/week: 0.0 oz  . Drug use: Yes    Frequency: 0.1 times per week    Types: Cocaine    Comment: 07/01/2016 last use relapse after 8 yrs  . Sexual activity: Not Currently  Lifestyle  . Physical activity:    Days per week: Not on file    Minutes per session: Not on file  . Stress: Not on file  Relationships  . Social connections:    Talks on phone: Not on file    Gets together: Not on file    Attends religious service: Not on file    Active member of club or organization: Not on file    Attends meetings of clubs or organizations: Not on file    Relationship status: Not on file  Other Topics Concern  . Not on file  Social History Narrative   Divorced with one child   No regular exercise    Allergies:  Allergies  Allergen Reactions  . Chlorhexidine Gluconate Itching and Rash  . Codeine Nausea And Vomiting and Other (See Comments)    In  high doses  . Lipitor [Atorvastatin] Nausea Only and Other (See Comments)    Nausea with high doses, tolerates 20mg  dose (08/22/16)  . Tape Other (See Comments)    Paper tape is ok    Objective:    Vital Signs:   Temp:  [97.9 F (36.6 C)] 97.9 F (36.6 C) (08/05 0428) Pulse Rate:  [56] 56 (08/05 0428) Resp:  [18] 18 (08/05 0428) SpO2:  [94 %] 94 % (08/05 0428) Weight:  [209 lb (94.8 kg)] 209 lb (94.8 kg) (08/05 0429)   Filed Weights   05/30/2018 0429  Weight: 209 lb (94.8 kg)    Mean arterial Pressure   Physical Exam    General:  Appears chronically ill. No resp difficulty HEENT: Normal Neck: supple. JVP to jaw . Carotids 2+ bilat; no bruits. No lymphadenopathy or thyromegaly appreciated. Cor: Mechanical heart sounds with LVAD hum present. R upper chest tunneled PICC Lungs: Decreased in the bases Abdomen: soft,  nontender, +++distended. No hepatosplenomegaly. No bruits or masses. Good bowel sounds. Driveline: C/D/I; securement device intact and driveline incorporated Extremities: no cyanosis, clubbing, rash, R and LLE 2+ edema. RLE partial thickness skin loss. Neuro: alert & orientedx1, cranial nerves grossly intact. moves all 4 extremities w/o difficulty.    Telemetry   SR 80 bpm   EKG  SR 80 bpm   Labs    Basic Metabolic Panel: Recent Labs  Lab 05/22/2018 0441  NA 126*  K 5.7*  CL 90*  CO2 25  GLUCOSE 188*  BUN 42*  CREATININE 1.66*  CALCIUM 8.8*    Liver Function Tests: Recent Labs  Lab 05/24/2018 0441  AST 31  ALT 22  ALKPHOS 102  BILITOT 1.1  PROT 7.1  ALBUMIN 3.4*   No results for input(s): LIPASE, AMYLASE in the last 168 hours. Recent Labs  Lab 05/16/2018 0441  AMMONIA 81*    CBC: Recent Labs  Lab 05/19/2018 0441  WBC 8.1  NEUTROABS 6.9  HGB 9.0*  HCT 31.7*  MCV 79.1  PLT 173    Cardiac Enzymes: Recent Labs  Lab 05/21/2018 0441  TROPONINI <0.03    BNP: BNP (last 3 results) Recent Labs    05/28/2018 0441  BNP 1,616.1*    ProBNP (last 3 results) No results for input(s): PROBNP in the last 8760 hours.   CBG: No results for input(s): GLUCAP in the last 168 hours.  Coagulation Studies: Recent Labs    05/09/2018 0441  LABPROT 29.2*  INR 2.79    Other results: EKG: NSR 80 bpm   Imaging    Dg Chest Portable 1 View  Result Date: 06/02/2018 CLINICAL DATA:  Acute onset of worsening shortness of breath and chest congestion. Productive cough. Abdominal edema. EXAM: PORTABLE CHEST 1 VIEW COMPARISON:  Chest radiograph performed 04/02/2018 FINDINGS: The lungs are well-aerated. Vascular congestion is noted. There is no evidence of focal opacification, pleural effusion or pneumothorax. The cardiomediastinal silhouette is mildly enlarged. The patient is status post median sternotomy. A pacemaker/AICD is noted overlying the left chest wall, with leads  ending overlying the right atrium, right ventricle and coronary sinus. A left ventricular assist device is noted. No acute osseous abnormalities are seen. IMPRESSION: Vascular congestion and mild cardiomegaly. Lungs remain grossly clear. Electronically Signed   By: Garald Balding M.D.   On: 05/17/2018 04:48       Patient Profile:  Ronald Miller. is a 62 y.o. male with history of chronic systolic HF s/p Medtronic ICD 2014, CAD  s/p CABG in 2000, HTN, Hx of cocaine abuse, Tobacco abuse, Depression, PAF, and COPD. HM3 placed 09/09/2016. 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 In April 2018 he was placed on milrinone but later switched to dobutamine. He remains on dobutamine 7.58mcg.   Admitted today with A/C systolic heart failure   Assessment/Plan:     1. A/C Biventricular HF- ECHO 2018 Ramp ECHO  HMIII 09/2016. On Dobutamine 7.5 mcg chronically.Check CO-OX  CXR with vascular congestion. Start lasix 80 mg three times a day. No spiro or potassium with hyperkalemia No BB with RV failure Continue midodrine 10 mg three times a day  Continue coumadin . INR goal 2-2.5  Discussed with pharmacy.  2. HMIII VAD interrogated No extrernal power alarms this morning. Will need to follow up with [facility] staff.  3. Hyponatremia Sodium on admit 126. Limit free water.  4. Anemia Received 2UPRBCs on 8/1 for hgb 7.7. Todays Hgb 9.0  5. Hyperkalemia K 5.7 Received IV lasix and sodum bicarb.  6. PAF maitaining NSR On coumadin.  7. Cirrhosis Ammonia 81. Continue lactulose.  8. RLE partial thickness wound. WOC consult.   Admit to 2c.   I reviewed the LVAD parameters from today, and compared the results to the patient's prior recorded data.  No programming changes were made.  The LVAD is functioning within specified parameters.  The patient performs LVAD self-test daily.  LVAD interrogation was negative for any significant power changes, alarms or PI events/speed drops.   LVAD equipment check completed and is in good working order.  Back-up equipment present.   LVAD education done on emergency procedures and precautions and reviewed exit site care.  Length of Stay: 0  Darrick Grinder, NP 05/17/2018, 7:20 AM  VAD Team Pager [phone] (7am - 7am) +++VAD ISSUES ONLY+++   Advanced Heart Failure Team Pager [phone] (M-F; [place])  Please contact [facility] for night-coverage after hours (4p -7a ) and weekends on [email or web address] for all non- LVAD Issues  Patient seen with NP, agree with the above note.    He is re-admitted with RV failure and volume overload.  He has HM3 LVAD and is chronically on dobutamine.  His dobutamine line became occluded at the SNF with alarming.  LVAD was interrogated today and functioning properly.   On exam, he is volume overloaded with JVP 16+ cm, tense abdomen, and 2+ edema to knees.   K elevated to 5.7, creatinine 1.66  We will admit him today for diuresis. Will start Lasix 80 mg IV every 8 hrs.  We will send him for a therapeutic paracentesis.  Continue dobutamine at 7.5, send co-ox.  Hopefully K will normalize with diuresis.   He will continue on warfarin for INR 2-2.5.   Loralie Champagne 06/01/2018 4:00 PM

## 2018-05-08 NOTE — Progress Notes (Signed)
[facility]  Active pt with [place] for Dobutamine while resident at [facility].  [facility] Infusion Coordinator will follow while here to support DC as ordered. If patient discharges after hours, please call [phone].   [name] 05/13/2018, 1:19 PM

## 2018-05-08 NOTE — Consult Note (Addendum)
[place] Nurse wound consult note Reason for Consult: Consult requested for right chest full thickness chronic wound.  Pt is familiar from a previous visit and states it has been present more than a year and is of unknown etiology Wound type: Full thickness circular, 3X3X.1cm Wound bed: Red and moist Drainage (amount, consistency, odor)  Small amt pink drainage, no odor Periwound: intact skin surrounding Dressing procedure/placement/frequency:  Pt has used antibiotic ointment on the past and it did not promote healing. Foam dressing to protect and promote healing.    Right posterior calf with partial thickness wound; 4X2X.1cm. Red and moist wound bed, small amt yellow drainage, no odor. Foam dressing and Bactroban to promote healing. Discussed with patient and family member at the bedside. Please re-consult if further assistance is needed.  Thank-you,  Julien Girt MSN, [place], [place], [place], [place]

## 2018-05-08 NOTE — ED Notes (Signed)
Patient Dobutamine drip infusing at 7.5 mcg/kg/min using hospital's pump per EDP order.

## 2018-05-08 NOTE — Progress Notes (Signed)
LVAD Coordinator Rounding Note:  Admitted 05/31/2018 due to Dobutamine pump alarming at SNF. Increased ascities noted. Paracentesis done- 3.6 L of fluid removed.   HM III LVAD implanted on 09/09/16 by Dr. Cyndia Bent under Destination Therapy criteria.  Pt sitting on the side of bed eating lunch. His sister is at the bedside.  Vital signs: Temp:  97.9 HR: 80 Doppler Pressure:  Not done Automatic BP:  116/50 O2 Sat: 92 % on RA Wt: 207>209 lbs  LVAD interrogation reveals:  Speed: 5800 Flow: 5.6 Power: 4.5 w PI:  2.6 Alarms: none  Events: no events Hct: 30 Fixed speed:  5800 Low speed limit: 5500  Drive Line: left abdominal sorbaview dressing dry and intact; anchor intact and accurately applied. Labs:  LDH trend: 145  INR trend: 2.36  Gtts: Dobutamine 7.5 mcg/kg/min  Anticoagulation Plan: -INR Goal:  1.8 - 2.3 -ASA Dose: no ASA (history of GI bleeding)  Device: -Protect; end of service 08/2016 -Therapies:  Off Last check: complete interrogation 05/26/2017. DDD (lower rate 60); BiV pacing with 96% AS-VP  Adverse Events on VAD: 11/30/16> Milrinone gtt at discharge 01/27/17>dobutamine at 5 mcg/kg/mingtt at discharge 03/08/17> RV failure 04/29/17> symptomatic anemia and melena. EGD revealed stomach ulcers. Carafate added; ASA stopped 05/31/17>Ongoing symptomatic anemia and melena; capsule study mild antral gastritis; poss tiny AVM right colon. Lower MAPS, Losartan cut back. 06/2017> admit with syncope, AMS, Dobutamine at 7.19mcg 11/09/17>Marked ascites, RV failure, and hepatic encephalopathy. Paracentesis on 2/7 and 2/12 with >11 liters fluid removed 12/27/17>Outpatient paracentesis with 2.8 liters removed 01/11/18>IP paracentesis with 4.8 liters removed 01/16/18>IP paracentesis w/2.4L removed 02/15/18>OP paracentesis w/ 5 L removed 02/23/18>IP paracentesis w/ 5.6 L removed 04/28/18> OP paracentesis w/ 3.6 L removed  05/07/2018>IP paracentesis w/ 3.6L removed   Plan/Recommendations:  1. For  paracentesis today.  2. Please call VAD pager with any VAD equipment or drive line issues.  Emerson Monte RN, VAD Coordiantor 24/7 VAD pager: [phone]
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
## 2018-05-09 LAB — AMMONIA: Ammonia: 62 umol/L — ABNORMAL HIGH (ref 9–35)

## 2018-05-09 LAB — BASIC METABOLIC PANEL
Anion gap: 11 (ref 5–15)
BUN: 43 mg/dL — ABNORMAL HIGH (ref 8–23)
CHLORIDE: 85 mmol/L — AB (ref 98–111)
CO2: 29 mmol/L (ref 22–32)
Calcium: 8.2 mg/dL — ABNORMAL LOW (ref 8.9–10.3)
Creatinine, Ser: 1.53 mg/dL — ABNORMAL HIGH (ref 0.61–1.24)
GFR calc non Af Amer: 47 mL/min — ABNORMAL LOW (ref >60)
GFR, EST AFRICAN AMERICAN: 55 mL/min — AB (ref >60)
Glucose, Bld: 221 mg/dL — ABNORMAL HIGH (ref 70–99)
POTASSIUM: 4.4 mmol/L (ref 3.5–5.1)
SODIUM: 125 mmol/L — AB (ref 135–145)

## 2018-05-09 LAB — CBC
HEMATOCRIT: 29.3 % — AB (ref 39.0–52.0)
Hemoglobin: 8.5 g/dL — ABNORMAL LOW (ref 13.0–17.0)
MCH: 22.5 pg — ABNORMAL LOW (ref 26.0–34.0)
MCHC: 29 g/dL — ABNORMAL LOW (ref 30.0–36.0)
MCV: 77.5 fL — AB (ref 78.0–100.0)
Platelets: 133 10*3/uL — ABNORMAL LOW (ref 150–400)
RBC: 3.78 MIL/uL — AB (ref 4.22–5.81)
RDW: 19.5 % — ABNORMAL HIGH (ref 11.5–15.5)
WBC: 6.9 10*3/uL (ref 4.0–10.5)

## 2018-05-09 LAB — GLUCOSE, CAPILLARY
GLUCOSE-CAPILLARY: 193 mg/dL — AB (ref 70–99)
GLUCOSE-CAPILLARY: 95 mg/dL (ref 70–99)
GLUCOSE-CAPILLARY: 186 mg/dL — AB (ref 70–99)
Glucose-Capillary: 173 mg/dL — ABNORMAL HIGH (ref 70–99)

## 2018-05-09 LAB — PROTIME-INR
INR: 2.7
Prothrombin Time: 28.4 seconds — ABNORMAL HIGH (ref 11.4–15.2)

## 2018-05-09 LAB — LACTATE DEHYDROGENASE: LDH: 157 U/L (ref 98–192)

## 2018-05-09 MED ORDER — WARFARIN SODIUM 2.5 MG PO TABS
2.5000 mg | ORAL_TABLET | Freq: Once | ORAL | Status: AC
  Administered 2018-05-09: 2.5 mg via ORAL
  Filled 2018-05-09: qty 1

## 2018-05-09 MED ORDER — ACETAZOLAMIDE 250 MG PO TABS
250.0000 mg | ORAL_TABLET | Freq: Two times a day (BID) | ORAL | Status: DC
  Administered 2018-05-09 – 2018-05-15 (×14): 250 mg via ORAL
  Filled 2018-05-09 (×16): qty 1

## 2018-05-09 MED ORDER — METOLAZONE 5 MG PO TABS
5.0000 mg | ORAL_TABLET | Freq: Two times a day (BID) | ORAL | Status: DC
  Administered 2018-05-09 – 2018-05-15 (×13): 5 mg via ORAL
  Filled 2018-05-09 (×14): qty 1

## 2018-05-09 MED ORDER — TOLVAPTAN 15 MG PO TABS
15.0000 mg | ORAL_TABLET | Freq: Once | ORAL | Status: AC
  Administered 2018-05-09: 15 mg via ORAL
  Filled 2018-05-09 (×2): qty 1

## 2018-05-09 NOTE — Progress Notes (Signed)
Left abdominal sorbaview dressing dry but loose. Anchor also loose.. Existing VAD dressing removed and site care performed. Dressing changed using sterile technique. Drive line exit site cleansed with betadine swabs x3, allowed to dry then reapplied sorbaview dressing without biopatch. New Drive line anchor applied. Next dressing change due in a week 05/16/2018

## 2018-05-09 NOTE — Progress Notes (Signed)
LVAD Coordinator Rounding Note:  Admitted 05/28/2018 to Dr. Haroldine Laws due to ascites and CHF with fluid overload.   HM III LVAD implanted on 09/09/16 by Dr. Cyndia Bent under Destination Therapy criteria.  Pt lying in bed, very confused this am. Can't remember why he was admitted, doesn't remember how much volume was pulled off with his paracentesis yesterday.    Vital signs: Temp:  97.3 HR: 87 Doppler Pressure:  78 Automatic BP:  98/76 (84) O2 Sat: 94 % on RA Wt:  209>215 lbs  LVAD interrogation reveals:  Speed: 5800 Flow: 5.4 Power: 4.4 w PI:  3.1 Alarms: none Events: 6 Pi events Hct: 29 Fixed speed:  5800 Low speed limit: 5500  Drive Line: left abdominal sorbaview dressing dry and intact; anchor intact and accurately applied.  Pt has an intolerance to CHG please use saline and betadine only.  Next dressing change due today. Bedside nurse may change dressing.   Labs:  LDH trend: 185>157  INR trend: 2.79>2.70  Gtts: Dobutamine 7.5 mcg/kg/min  Anticoagulation Plan: -INR Goal:  1.8 - 2.3 -ASA Dose: no ASA (history of GI bleeding)  Device: -Protect; end of service 08/2016 -Therapies:  Off Last check: complete interrogation 05/26/2017. DDD (lower rate 60); BiV pacing with 96% AS-VP  Adverse Events on VAD: 11/30/16> Milrinone gtt at discharge 01/27/17>dobutamine at 5 mcg/kg/mingtt at discharge 03/08/17> RV failure 04/29/17> symptomatic anemia and melena. EGD revealed stomach ulcers. Carafate added; ASA stopped 05/31/17>Ongoing symptomatic anemia and melena; capsule study mild antral gastritis; poss tiny AVM right colon. Lower MAPS, Losartan cut back. 06/2017> admit with syncope, AMS, Dobutamine at 7.44mcg 11/09/17>Marked ascites, RV failure, and hepatic encephalopathy. Paracentesis on 2/7 and 2/12 with >11 liters fluid removed 12/27/17>Outpatient paracentesis with 2.8 liters removed 01/11/18>IP paracentesis with 4.8 liters removed 01/16/18>IP paracentesis w/2.4L removed 02/15/18>OP  paracentesis w/ 5 L removed 02/23/18>IP paracentesis w/ 5.6 L removed   Plan/Recommendations:  1. Will need weekly dressing changes; due 05/09/18. Bedside RN may change dressing w/above instructions. 2. Please call VAD pager with any VAD equipment or drive line issues.  Zada Girt RN, VAD Coordiantor 24/7 VAD pager: [phone]
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
## 2018-05-09 NOTE — Progress Notes (Addendum)
Advanced Heart Failure VAD Team Note  PCP-Cardiologist: Glori Bickers, MD   Subjective:    Brisk diuresis with - 1.6 L on 80 mg IV lasix TID. Weight up ?6 lbs. Creatinine 1.53.  Remains on dobutamine 7.5 . Coox pending this am.   Paracentesis 8/5 with 3.6 L off.  Sitting up on side of bed. Denies CP or SOB. No orthopnea or PND.  No bleeding. Can't remember what brought him to the hospital.   LVAD INTERROGATION:  HeartMate 3 LVAD:   Flow 5.5 liters/min, speed 5800, power 4.5, PI 2.7.  6 PI events in 24/hrs. Personally reviewed.   Objective:    Vital Signs:   Temp:  [97.6 F (36.4 C)-98.8 F (37.1 C)] 97.7 F (36.5 C) (08/06 0300) Pulse Rate:  [60-102] 80 (08/06 0300) Resp:  [16-29] 16 (08/06 0300) BP: (83-145)/(50-90) 83/66 (08/06 0300) SpO2:  [90 %-99 %] 95 % (08/06 0300) Weight:  [215 lb (97.5 kg)] 215 lb (97.5 kg) (08/06 0300)   Mean arterial Pressure 70s Personally reviewed   Intake/Output:   Intake/Output Summary (Last 24 hours) at 05/09/2018 0710 Last data filed at 05/09/2018 0600 Gross per 24 hour  Intake 1702.57 ml  Output 3250 ml  Net -1547.43 ml     Physical Exam    General:  Chronically ill appearing. No resp difficulty. Sitting on side of bed HEENT: normal anicteric Neck: supple. JVP to jaw. Carotids 2+ bilat; no bruits. No lymphadenopathy or thyromegaly appreciated. Cor: Mechanical heart sounds with LVAD hum present. Lungs: clear no wheeze Abdomen: soft, nontender, mildly distended. No hepatosplenomegaly. No bruits or masses. Good bowel sounds. Driveline: C/D/I; securement device intact and driveline incorporated Extremities: no cyanosis, clubbing, rash, BLE 1-2+ edema  Neuro: alert & orientedx3, cranial nerves grossly intact. moves all 4 extremities w/o difficulty. Affect pleasant No asterixis    Telemetry   AV paced 80s. Personally reviewed.   EKG    No new tracings.   Labs   Basic Metabolic Panel: Recent Labs  Lab 05/31/2018 0441  05/07/2018 1151  NA 126* 126*  K 5.7* 4.9  CL 90* 87*  CO2 25 29  GLUCOSE 188* 169*  BUN 42* 44*  CREATININE 1.66* 1.65*  CALCIUM 8.8* 8.7*    Liver Function Tests: Recent Labs  Lab 05/07/2018 0441  AST 31  ALT 22  ALKPHOS 102  BILITOT 1.1  PROT 7.1  ALBUMIN 3.4*   No results for input(s): LIPASE, AMYLASE in the last 168 hours. Recent Labs  Lab 05/24/2018 0441  AMMONIA 81*    CBC: Recent Labs  Lab 06/01/2018 0441  WBC 8.1  NEUTROABS 6.9  HGB 9.0*  HCT 31.7*  MCV 79.1  PLT 173    INR: Recent Labs  Lab 05/04/18 0809 05/17/2018 0441  INR 2.46 2.79    Other results:     Imaging   Dg Chest Portable 1 View  Result Date: 05/26/2018 CLINICAL DATA:  Acute onset of worsening shortness of breath and chest congestion. Productive cough. Abdominal edema. EXAM: PORTABLE CHEST 1 VIEW COMPARISON:  Chest radiograph performed 04/02/2018 FINDINGS: The lungs are well-aerated. Vascular congestion is noted. There is no evidence of focal opacification, pleural effusion or pneumothorax. The cardiomediastinal silhouette is mildly enlarged. The patient is status post median sternotomy. A pacemaker/AICD is noted overlying the left chest wall, with leads ending overlying the right atrium, right ventricle and coronary sinus. A left ventricular assist device is noted. No acute osseous abnormalities are seen. IMPRESSION: Vascular congestion and mild  cardiomegaly. Lungs remain grossly clear. Electronically Signed   By: Garald Balding M.D.   On: 05/04/2018 04:48   Ir Paracentesis  Result Date: 05/23/2018 INDICATION: Recurrent ascites. History of CHF with LVAD, CAD s/p CABG, HTN. Request for therapeutic paracentesis. EXAM: ULTRASOUND GUIDED THERAPEUTIC PARACENTESIS MEDICATIONS: 10 mL 2% lidocaine. COMPLICATIONS: None immediate. PROCEDURE: Informed written consent was obtained from the patient after a discussion of the risks, benefits and alternatives to treatment. A timeout was performed prior to the  initiation of the procedure. Initial ultrasound scanning demonstrates a large amount of ascites within the right lower abdominal quadrant. The right lower abdomen was prepped and draped in the usual sterile fashion. 2% lidocaine was used for local anesthesia. Following this, a 6 Fr Safe-T-Centesis catheter was introduced. An ultrasound image was saved for documentation purposes. The paracentesis was performed. The catheter was removed and a dressing was applied. The patient tolerated the procedure well without immediate post procedural complication. FINDINGS: A total of approximately 3.6L of clear yellow fluid was removed. Samples were not sent to the laboratory. IMPRESSION: Successful ultrasound-guided paracentesis yielding 3.6 liters of peritoneal fluid. Read by Candiss Norse, PA-C Electronically Signed   By: Lucrezia Europe M.D.   On: 05/24/2018 15:10      Medications:     Scheduled Medications: . amiodarone  200 mg Oral Daily  . furosemide  80 mg Intravenous TID  . gabapentin  300 mg Oral BID  . insulin aspart  0-20 Units Subcutaneous TID WC  . insulin aspart  5 Units Subcutaneous TID WC  . lactulose  45 g Oral TID  . levothyroxine  50 mcg Oral QAC breakfast  . magnesium oxide  400 mg Oral Daily  . midodrine  10 mg Oral TID WC  . mupirocin cream   Topical Daily  . pantoprazole  40 mg Oral Daily  . sucralfate  1 g Oral TID WC & HS  . Warfarin - Pharmacist Dosing Inpatient   Does not apply q1800     Infusions: . DOBUTamine 7.5 mcg/kg/min (05/11/2018 1527)     PRN Medications:  acetaminophen, docusate sodium, ondansetron (ZOFRAN) IV, ondansetron, ondansetron, senna-docusate   Patient Profile   Ronald Milleris a 62 y.o.malewith history of chronic systolic HF s/p Medtronic ICD 2014, CAD s/p CABG in 2000, HTN, Hx of cocaine abuse, Tobacco abuse, Depression, PAF, and COPD. HM3 placed 09/09/2016. Post-VAD course complicated by severe RV failure and cirrhosis.In April 2018 he was  placed on milrinone but later switched to dobutamine. He remains on dobutamine 7.13mcg.   Admitted 8/5 with A/C systolic heart failure   Assessment/Plan:     1. A/C Biventricular HF- ECHO 2018 Ramp ECHO  HMIII 09/2016. Continue Dobutamine 7.5 mcg chronically. Coox pending.   Volume status remains elevated. CVP 27. Continue lasix 80 mg three times a day. Add metolazone 5 mg BID and diamox 250 mg BID No spiro or potassium with hyperkalemia No BB with RV failure Continue midodrine 10 mg three times a day. MAPs 70s Continue coumadin. INR goal 2-2.5. INR 2.70. Per pharmacy.  Discussed with pharmacy.  2. HMIII VAD interrogated. 6 PI events/24 hours. LDH stable 157 No extrernal power alarms 8/5 am. Will need to follow up with [facility] staff.  3. Hyponatremia Sodium on admit 126. Limit free water. Na 125 this am. May need tolvaptan.  4. Anemia Received 2UPRBCs on 8/1 for hgb 7.7. Hemoglobin 9.0 > 8.5.  5. Hyperkalemia K 5.7 Received IV lasix and sodum bicarb. K  4.4 this am.  6. PAF Maitaining NSR On coumadin.  7. Cirrhosis Ammonia 81. Continue lactulose. Alert this am. Forgot what about him into hospital, but otherwise oriented. 8. RLE partial thickness wound. WOC following. Recommending roam dressing and bactroban.    I reviewed the LVAD parameters from today, and compared the results to the patient's prior recorded data.  No programming changes were made.  The LVAD is functioning within specified parameters.  The patient performs LVAD self-test daily.  LVAD interrogation was negative for any significant power changes, alarms or PI events/speed drops.  LVAD equipment check completed and is in good working order.  Back-up equipment present.   LVAD education done on emergency procedures and precautions and reviewed exit site care.  Length of Stay: [place], NP 05/09/2018, 7:10 AM  VAD Team --- VAD ISSUES ONLY--- Pager [phone] (7am - 7am)  Advanced Heart Failure Team   Pager [phone] (M-F; 7a - 4p)  Please contact [facility] for night-coverage after hours (4p -7a ) and weekends on [email or web address]  Patient seen and examined with the above-signed Advanced Practice Provider and/or Housestaff. I personally reviewed laboratory data, imaging studies and relevant notes. I independently examined the patient and formulated the important aspects of the plan. I have edited the note to reflect any of my changes or salient points. I have personally discussed the plan with the patient and/or family.  He remains very tenuous. Markedly volume overloaded. Will increase lasix and add diamox and metolazone.  Also has recurrent hepatic encephalopathy. Increase lactulose. Continue dobutamine and midodrine for RV and BP support. Can give tolvaptan as needed for hyponatremia. INR 2.7. Discussed dosing with PharmD personally. VAD interrogated personally. Parameters stable.  Glori Bickers, MD  11:29 AM

## 2018-05-09 NOTE — Progress Notes (Signed)
[place] [facility] for warfarin Indication: LVAD  Allergies  Allergen Reactions  . Chlorhexidine Gluconate Itching and Rash  . Codeine Nausea And Vomiting and Other (See Comments)    In high doses  . Lipitor [Atorvastatin] Nausea Only and Other (See Comments)    Nausea with high doses, tolerates 20mg  dose (08/22/16)  . Tape Other (See Comments)    Paper tape is ok    Patient Measurements: Height: 5\' 9"  (175.3 cm) Weight: 215 lb (97.5 kg) IBW/kg (Calculated) : 70.7  Vital Signs: Temp: 98.5 F (36.9 C) (08/06 0736) Temp Source: Oral (08/06 0736) BP: 99/64 (08/06 0736) Pulse Rate: 81 (08/06 0736)  Labs: Recent Labs    05/31/2018 0441 05/07/2018 1151 05/09/18 0643  HGB 9.0*  --  8.5*  HCT 31.7*  --  29.3*  PLT 173  --  133*  LABPROT 29.2*  --  28.4*  INR 2.79  --  2.70  CREATININE 1.66* 1.65* 1.53*  TROPONINI <0.03  --   --     Estimated Creatinine Clearance: 58.4 mL/min (A) (by C-G formula based on SCr of 1.53 mg/dL (H)).   Medical History: Past Medical History:  Diagnosis Date  . AICD (automatic cardioverter/defibrillator) present   . Anxiety   . ASCVD (arteriosclerotic cardiovascular disease)   . Chronic systolic CHF (congestive heart failure) ([place])   . COPD (chronic obstructive pulmonary disease) ([place])   . Coronary artery disease   . Depression   . GI bleed   . High cholesterol   . History of blood transfusion   . History of cocaine abuse   . Hypertension   . LVAD (left ventricular assist device) present ([place])   . Presence of permanent cardiac pacemaker   . Shortness of breath dyspnea   . Suicidal ideation   . Tobacco abuse   . Type II diabetes mellitus ([facility])      Assessment: 42 yoM s/p LVAD for ICM presents with SOB. Pharmacy consulted to resumed warfarin, last dose 8/4. INR slightly supratherapeutic on admit at 2.79.  INR this am is still just above goal at 2.7. Will continue with reduced dose for now. No bleeding  issues noted. CBC stable.   *PTA Warfarin Dose = 5mg  Mon/Wed/Fri, 7.5mg  Tues/Thurs/Sat/Sun  Goal of Therapy:  INR 1.8-2.4 Monitor platelets by anticoagulation protocol: Yes   Plan:  -Warfarin 2.5mg  PO x1 -Daily protime  Erin Hearing PharmD., BCPS Clinical Pharmacist 05/09/2018 10:31 AM

## 2018-05-09 NOTE — Progress Notes (Signed)
Zaroxolyn and Lasix 6pm doses held per verbal MD based on BP. Will continue to monitor.   [place]  Maxen Rowland, RN

## 2018-05-09 NOTE — Progress Notes (Signed)
No blood return from patient's CVC for morning labs. First attempt to reach phlebotomy was unsuccessful, will call back in a couple of minutes.

## 2018-05-09 NOTE — Clinical Social Work Note (Signed)
Clinical Social Work Assessment  Patient Details  Name: Ronald Miller. MRN: [id number] Date of Birth: [birth year]  Date of referral:  05/09/18               Reason for consult:  Discharge Planning                Permission sought to share information with:  Facility Art therapist granted to share information::  Yes, Verbal Permission Granted  Name::        Agency::  [facility]  Relationship::     Contact Information:     Housing/Transportation Living arrangements for the past 2 months:  Experiment of Information:  Patient, Medical Team, Facility Patient Interpreter Needed:  None Criminal Activity/Legal Involvement Pertinent to Current Situation/Hospitalization:  No - Comment as needed Significant Relationships:  Siblings Lives with:  Facility Resident Do you feel safe going back to the place where you live?  Yes Need for family participation in patient care:  Yes (Comment)  Care giving concerns:  Patient is a long-term resident from [facility].   Social Worker assessment / plan:  CSW met with patient. No supports at bedside. CSW familiar with patient from multiple admissions. Patient confirmed plan to return to [facility] at discharge. He asked how long he will have to be here and stated he does not remember being admitted due to memory issues. No further concerns. CSW encouraged patient to contact CSW as needed. CSW will continue to follow patient for support and facilitate discharge back to SNF once medically stable.  Employment status:  Disabled (Comment on whether or not currently receiving Disability) Insurance information:  Managed Medicare PT Recommendations:  Not assessed at this time Information / Referral to community resources:  [place]  Patient/Family's Response to care:  Patient agreeable to return to SNF. Patient's sister supportive and involved in patient's care. Patient appreciated social  work intervention.  Patient/Family's Understanding of and Emotional Response to Diagnosis, Current Treatment, and Prognosis:  Patient has a good understanding of the reason for admission and plan to return to SNF at discharge. Patient appears happy with hospital care.  Emotional Assessment Appearance:  Appears stated age Attitude/Demeanor/Rapport:  Engaged, Gracious Affect (typically observed):  Accepting, Appropriate, Calm, Pleasant Orientation:  Oriented to Self, Oriented to Place, Oriented to  Time, Oriented to Situation Alcohol / Substance use:  Tobacco Use, Illicit Drugs Psych involvement (Current and /or in the community):  No (Comment)  Discharge Needs  Concerns to be addressed:  Care Coordination Readmission within the last 30 days:  No Current discharge risk:  None Barriers to Discharge:  Continued Medical Work up   Candie Chroman, LCSW 05/09/2018, 3:30 PM

## 2018-05-09 NOTE — Plan of Care (Signed)

## 2018-05-10 LAB — CBC
HEMATOCRIT: 30 % — AB (ref 39.0–52.0)
HEMOGLOBIN: 8.5 g/dL — AB (ref 13.0–17.0)
MCH: 22.1 pg — AB (ref 26.0–34.0)
MCHC: 28.3 g/dL — ABNORMAL LOW (ref 30.0–36.0)
MCV: 77.9 fL — ABNORMAL LOW (ref 78.0–100.0)
Platelets: 147 10*3/uL — ABNORMAL LOW (ref 150–400)
RBC: 3.85 MIL/uL — ABNORMAL LOW (ref 4.22–5.81)
RDW: 19.6 % — ABNORMAL HIGH (ref 11.5–15.5)
WBC: 6.3 10*3/uL (ref 4.0–10.5)

## 2018-05-10 LAB — GLUCOSE, CAPILLARY
GLUCOSE-CAPILLARY: 246 mg/dL — AB (ref 70–99)
GLUCOSE-CAPILLARY: 108 mg/dL — AB (ref 70–99)
Glucose-Capillary: 162 mg/dL — ABNORMAL HIGH (ref 70–99)
Glucose-Capillary: 155 mg/dL — ABNORMAL HIGH (ref 70–99)

## 2018-05-10 LAB — BASIC METABOLIC PANEL
ANION GAP: 13 (ref 5–15)
BUN: 39 mg/dL — ABNORMAL HIGH (ref 8–23)
CALCIUM: 8.2 mg/dL — AB (ref 8.9–10.3)
CHLORIDE: 84 mmol/L — AB (ref 98–111)
CO2: 28 mmol/L (ref 22–32)
Creatinine, Ser: 1.48 mg/dL — ABNORMAL HIGH (ref 0.61–1.24)
GFR calc Af Amer: 57 mL/min — ABNORMAL LOW (ref >60)
GFR calc non Af Amer: 49 mL/min — ABNORMAL LOW (ref >60)
GLUCOSE: 208 mg/dL — AB (ref 70–99)
POTASSIUM: 3.4 mmol/L — AB (ref 3.5–5.1)
Sodium: 125 mmol/L — ABNORMAL LOW (ref 135–145)

## 2018-05-10 LAB — PROTIME-INR
INR: 2.47
PROTHROMBIN TIME: 26.6 s — AB (ref 11.4–15.2)

## 2018-05-10 LAB — LACTATE DEHYDROGENASE: LDH: 158 U/L (ref 98–192)

## 2018-05-10 LAB — COOXEMETRY PANEL
Carboxyhemoglobin: 2.4 % — ABNORMAL HIGH (ref 0.5–1.5)
Methemoglobin: 1.1 % (ref 0.0–1.5)
O2 SAT: 59.3 %
TOTAL HEMOGLOBIN: 8.8 g/dL — AB (ref 12.0–16.0)

## 2018-05-10 MED ORDER — WARFARIN SODIUM 2.5 MG PO TABS
2.5000 mg | ORAL_TABLET | Freq: Once | ORAL | Status: AC
  Administered 2018-05-10: 2.5 mg via ORAL
  Filled 2018-05-10: qty 1

## 2018-05-10 MED ORDER — ALTEPLASE 2 MG IJ SOLR
2.0000 mg | Freq: Once | INTRAMUSCULAR | Status: AC
  Administered 2018-05-10: 2 mg
  Filled 2018-05-10: qty 2

## 2018-05-10 MED ORDER — FUROSEMIDE 10 MG/ML IJ SOLN
30.0000 mg/h | INTRAMUSCULAR | Status: DC
  Administered 2018-05-10 – 2018-05-15 (×15): 30 mg/h via INTRAVENOUS
  Filled 2018-05-10 (×14): qty 25
  Filled 2018-05-10: qty 21
  Filled 2018-05-10 (×4): qty 25
  Filled 2018-05-10: qty 21
  Filled 2018-05-10: qty 25

## 2018-05-10 MED ORDER — POTASSIUM CHLORIDE CRYS ER 20 MEQ PO TBCR
40.0000 meq | EXTENDED_RELEASE_TABLET | Freq: Once | ORAL | Status: AC
  Administered 2018-05-10: 40 meq via ORAL
  Filled 2018-05-10: qty 2

## 2018-05-10 NOTE — Plan of Care (Signed)

## 2018-05-10 NOTE — Progress Notes (Addendum)
Advanced Heart Failure VAD Team Note  PCP-Cardiologist: Glori Bickers, MD   Subjective:    Brisk diuresis with 1.9 L net negative. Weight down 3 lbs. He did not receive 6 pm dose of lasix or metolazone due to low BPs (MAP 68). Creatinine stable 1.48. K 3.4. CVP 21  Remains on dobutamine 7.5 mcg/kg/min. No blood return from PICC for coox. IV team to troubleshoot today.   Paracentesis 8/5 with 3.6 L off.  Hemoglobin 8.5, INR 2.47, LDH stable 158.   Feeling nauseated this am. Didn't sleep well. Feels weak. SOB with any activity. No dizziness. Denies bleeding.  LVAD INTERROGATION:  HeartMate 3 LVAD:   Flow 5.6 liters/min, speed 5800, power 5.0, PI 2.5.16 PI events in 24/hrs. Personally reviewed.   Objective:    Vital Signs:   Temp:  [97.6 F (36.4 C)-98.7 F (37.1 C)] 97.6 F (36.4 C) (08/07 0723) Pulse Rate:  [55-152] 80 (08/07 0723) Resp:  [19-24] 24 (08/07 0723) BP: (62-103)/(34-84) 102/38 (08/07 0723) SpO2:  [92 %-99 %] 99 % (08/07 0723) Weight:  [212 lb 12.8 oz (96.5 kg)] 212 lb 12.8 oz (96.5 kg) (08/07 0300) Last BM Date: 05/09/18 Mean arterial Pressure 68-80.   Intake/Output:   Intake/Output Summary (Last 24 hours) at 05/10/2018 0744 Last data filed at 05/10/2018 0705 Gross per 24 hour  Intake 1623.66 ml  Output 3425 ml  Net -1801.34 ml     Physical Exam    General: Chronically ill appearing. NAD.  HEENT: Normal. anicteric Neck: Supple, JVP to jaw - prominent CV waves Carotids OK.  Cardiac:  Mechanical heart sounds with LVAD hum present.  Lungs:  CTAB, normal effort. Decreased at bases Abdomen:  Obese markedly distended. NT no HSM. No bruits or masses. +BS  LVAD exit site: Dressing dry and intact. No erythema or drainage. Stabilization device present and accurately applied.  Extremities:  Warm and dry. No cyanosis, clubbing, rash, BLE 2-3+ edema. Cool extremities. No asterixis.  Neuro: alert & oriented x 3, cranial nerves grossly intact. moves all 4  extremities w/o difficulty. Affect pleasant   Telemetry   AV paced 80s. Personally reviewed.   EKG    No new tracings.   Labs   Basic Metabolic Panel: Recent Labs  Lab 05/31/2018 0441 05/17/2018 1151 05/09/18 0643 05/10/18 0447  NA 126* 126* 125* 125*  K 5.7* 4.9 4.4 3.4*  CL 90* 87* 85* 84*  CO2 25 29 29 28   GLUCOSE 188* 169* 221* 208*  BUN 42* 44* 43* 39*  CREATININE 1.66* 1.65* 1.53* 1.48*  CALCIUM 8.8* 8.7* 8.2* 8.2*    Liver Function Tests: Recent Labs  Lab 05/07/2018 0441  AST 31  ALT 22  ALKPHOS 102  BILITOT 1.1  PROT 7.1  ALBUMIN 3.4*   No results for input(s): LIPASE, AMYLASE in the last 168 hours. Recent Labs  Lab 05/06/2018 0441 05/09/18 0643  AMMONIA 81* 62*    CBC: Recent Labs  Lab 05/31/2018 0441 05/09/18 0643 05/10/18 0447  WBC 8.1 6.9 6.3  NEUTROABS 6.9  --   --   HGB 9.0* 8.5* 8.5*  HCT 31.7* 29.3* 30.0*  MCV 79.1 77.5* 77.9*  PLT 173 133* 147*    INR: Recent Labs  Lab 05/04/18 0809 05/31/2018 0441 05/09/18 0643 05/10/18 0447  INR 2.46 2.79 2.70 2.47    Other results:     Imaging   Ir Paracentesis  Result Date: 05/17/2018 INDICATION: Recurrent ascites. History of CHF with LVAD, CAD s/p CABG, HTN. Request for  therapeutic paracentesis. EXAM: ULTRASOUND GUIDED THERAPEUTIC PARACENTESIS MEDICATIONS: 10 mL 2% lidocaine. COMPLICATIONS: None immediate. PROCEDURE: Informed written consent was obtained from the patient after a discussion of the risks, benefits and alternatives to treatment. A timeout was performed prior to the initiation of the procedure. Initial ultrasound scanning demonstrates a large amount of ascites within the right lower abdominal quadrant. The right lower abdomen was prepped and draped in the usual sterile fashion. 2% lidocaine was used for local anesthesia. Following this, a 6 Fr Safe-T-Centesis catheter was introduced. An ultrasound image was saved for documentation purposes. The paracentesis was performed. The  catheter was removed and a dressing was applied. The patient tolerated the procedure well without immediate post procedural complication. FINDINGS: A total of approximately 3.6L of clear yellow fluid was removed. Samples were not sent to the laboratory. IMPRESSION: Successful ultrasound-guided paracentesis yielding 3.6 liters of peritoneal fluid. Read by Candiss Norse, PA-C Electronically Signed   By: Lucrezia Europe M.D.   On: 05/06/2018 15:10     Medications:     Scheduled Medications: . acetaZOLAMIDE  250 mg Oral BID  . amiodarone  200 mg Oral Daily  . furosemide  80 mg Intravenous TID  . gabapentin  300 mg Oral BID  . insulin aspart  0-20 Units Subcutaneous TID WC  . insulin aspart  5 Units Subcutaneous TID WC  . lactulose  45 g Oral TID  . levothyroxine  50 mcg Oral QAC breakfast  . magnesium oxide  400 mg Oral Daily  . metolazone  5 mg Oral BID  . midodrine  10 mg Oral TID WC  . mupirocin cream   Topical Daily  . pantoprazole  40 mg Oral Daily  . sucralfate  1 g Oral TID WC & HS  . Warfarin - Pharmacist Dosing Inpatient   Does not apply q1800    Infusions: . DOBUTamine 7.5 mcg/kg/min (05/09/18 1724)    PRN Medications: acetaminophen, docusate sodium, ondansetron (ZOFRAN) IV, ondansetron, senna-docusate   Patient Profile   Ronald Milleris a 62 y.o.malewith history of chronic systolic HF s/p Medtronic ICD 2014, CAD s/p CABG in 2000, HTN, Hx of cocaine abuse, Tobacco abuse, Depression, PAF, and COPD. HM3 placed 09/09/2016. 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 He remains on dobutamine 7.56mcg.   Admitted 8/5 with A/C systolic heart failure   Assessment/Plan:     1. A/C Biventricular HF- ECHO 2018 Ramp ECHO  HMIII 09/2016. Continue Dobutamine 7.5 mcg chronically. Coox pending.   Volume status remains elevated CVP 21. Continue lasix 80 mg three times a day.  Continue metolazone 5 mg BID and diamox 250 mg BID No spiro or potassium with hyperkalemia No BB with RV failure Continue midodrine 10 mg three times a day. MAPs generally 70s Continue coumadin. INR goal 2-2.5. INR 2.47. Per pharmacy.  Discussed with pharmacy.  2. HMIII VAD interrogated. 16 PI events/24 hours. LDH stable 158. PI's running on the low side.  No extrernal power alarms 8/5 am. Will need to follow up with [facility] staff.  3. Hyponatremia Received tolvaptan x1 yesterday. Na still 125. Limit free water.  4. Anemia Received 2UPRBCs on 8/1 for hgb 7.7. Hemoglobin 9.0 > 8.5 > 8.5. Denies bleeding.  5. Hyperkalemia K 5.7 on admit. Received IV lasix and sodum bicarb. K 3.4. Supp cautiously today. 6. PAF Maitaining NSR On coumadin.  7. Cirrhosis Ammonia 81. Lactulose increased yesterday. Had several  BMs yesterday. 8. RLE partial thickness wound. WOC following. Recommending roam dressing and bactroban. No change.   I reviewed the LVAD parameters from today, and compared the results to the patient's prior recorded data.  No programming changes were made.  The LVAD is functioning within specified parameters.  The patient performs LVAD self-test daily.  LVAD interrogation was negative for any significant power changes, alarms or PI events/speed drops.  LVAD equipment check completed and is in good working order.  Back-up equipment present.   LVAD education done on emergency procedures and precautions and reviewed exit site care.  Length of Stay: [place], NP 05/10/2018, 7:44 AM  VAD Team --- VAD ISSUES ONLY--- Pager [phone] (7am - 7am)  Advanced Heart Failure Team  Pager [phone] (M-F; 7a - 4p)  Please contact [facility] for night-coverage after hours (4p -7a ) and weekends on [email or web address]  Patient seen and examined with the above-signed Advanced Practice Provider and/or Housestaff. I personally reviewed laboratory data, imaging studies and relevant notes. I  independently examined the patient and formulated the important aspects of the plan. I have edited the note to reflect any of my changes or salient points. I have personally discussed the plan with the patient and/or family.  He has end-stage RV failure with marked volume overload despite chronic inotropic support with dobutamine.  He is making very little progress with high-dose bolus lasix. Will start lasix drip and repeat paracentesis. Still seems a bit encephalopathic. Lactulose increased yesterday. Will repeat ammonia in am. VAD interrogated personally. Parameters stable. Sodium remains low. INR 2.5. Discussed dosing with PharmD personally.  We discussed hospice and palliative care but he says he does not want to give up.   Glori Bickers, MD  5:46 PM

## 2018-05-10 NOTE — Progress Notes (Signed)
[place] for warfarin Indication: LVAD  Allergies  Allergen Reactions  . Chlorhexidine Gluconate Itching and Rash  . Codeine Nausea And Vomiting and Other (See Comments)    In high doses  . Lipitor [Atorvastatin] Nausea Only and Other (See Comments)    Nausea with high doses, tolerates 20mg  dose (08/22/16)  . Tape Other (See Comments)    Paper tape is ok    Patient Measurements: Height: 5\' 9"  (175.3 cm) Weight: 212 lb 12.8 oz (96.5 kg) IBW/kg (Calculated) : 70.7  Vital Signs: Temp: 97.6 F (36.4 C) (08/07 0723) Temp Source: Oral (08/07 0723) BP: 102/38 (08/07 0723) Pulse Rate: 80 (08/07 0723)  Labs: Recent Labs    05/09/2018 0441 05/07/2018 1151 05/09/18 0643 05/10/18 0447  HGB 9.0*  --  8.5* 8.5*  HCT 31.7*  --  29.3* 30.0*  PLT 173  --  133* 147*  LABPROT 29.2*  --  28.4* 26.6*  INR 2.79  --  2.70 2.47  CREATININE 1.66* 1.65* 1.53* 1.48*  TROPONINI <0.03  --   --   --     Estimated Creatinine Clearance: 60.1 mL/min (A) (by C-G formula based on SCr of 1.48 mg/dL (H)).   Medical History: Past Medical History:  Diagnosis Date  . AICD (automatic cardioverter/defibrillator) present   . Anxiety   . ASCVD (arteriosclerotic cardiovascular disease)   . Chronic systolic CHF (congestive heart failure) ([place])   . COPD (chronic obstructive pulmonary disease) ([place])   . Coronary artery disease   . Depression   . GI bleed   . High cholesterol   . History of blood transfusion   . History of cocaine abuse   . Hypertension   . LVAD (left ventricular assist device) present ([place])   . Presence of permanent cardiac pacemaker   . Shortness of breath dyspnea   . Suicidal ideation   . Tobacco abuse   . Type II diabetes mellitus ([facility])      Assessment: 43 yoM s/p LVAD for ICM presents with SOB. Pharmacy consulted to resumed warfarin, last dose 8/4. INR slightly supratherapeutic on admit at 2.79.  INR this am is still just above goal at  2.47. Will continue with reduced dose for now. No bleeding issues noted. CBC stable.   *PTA Warfarin Dose = 5mg  Mon/Wed/Fri, 7.5mg  Tues/Thurs/Sat/Sun  Goal of Therapy:  INR 1.8-2.4 Monitor platelets by anticoagulation protocol: Yes   Plan:  -Warfarin 2.5mg  PO x1 -Daily protime  Erin Hearing PharmD., BCPS Clinical Pharmacist 05/10/2018 9:11 AM

## 2018-05-10 NOTE — Progress Notes (Signed)
Patient has tunneled central line to the right upper chest that has no blood return and is sluggish to flush. Patient dressing to the central line needs to be changed due to 2x2 present since 05/05/18. Patient refused dressing change due to skin tear to the right upper chest from previous dressing change. RN Mliss Sax made aware.

## 2018-05-11 ENCOUNTER — Inpatient Hospital Stay (HOSPITAL_COMMUNITY): Payer: Medicare HMO

## 2018-05-11 ENCOUNTER — Encounter (HOSPITAL_COMMUNITY): Payer: Self-pay | Admitting: Physician Assistant

## 2018-05-11 ENCOUNTER — Encounter (HOSPITAL_COMMUNITY): Payer: Self-pay

## 2018-05-11 DIAGNOSIS — K7290 Hepatic failure, unspecified without coma: Secondary | ICD-10-CM

## 2018-05-11 HISTORY — PX: IR PARACENTESIS: IMG2679

## 2018-05-11 LAB — CBC
HCT: 30 % — ABNORMAL LOW (ref 39.0–52.0)
HEMOGLOBIN: 8.5 g/dL — AB (ref 13.0–17.0)
MCH: 22.3 pg — AB (ref 26.0–34.0)
MCHC: 28.3 g/dL — AB (ref 30.0–36.0)
MCV: 78.7 fL (ref 78.0–100.0)
Platelets: 166 10*3/uL (ref 150–400)
RBC: 3.81 MIL/uL — ABNORMAL LOW (ref 4.22–5.81)
RDW: 19.5 % — ABNORMAL HIGH (ref 11.5–15.5)
WBC: 6 10*3/uL (ref 4.0–10.5)

## 2018-05-11 LAB — BASIC METABOLIC PANEL
Anion gap: 11 (ref 5–15)
Anion gap: 12 (ref 5–15)
BUN: 36 mg/dL — ABNORMAL HIGH (ref 8–23)
BUN: 36 mg/dL — AB (ref 8–23)
CALCIUM: 8.6 mg/dL — AB (ref 8.9–10.3)
CALCIUM: 8.5 mg/dL — AB (ref 8.9–10.3)
CO2: 34 mmol/L — AB (ref 22–32)
CO2: 37 mmol/L — ABNORMAL HIGH (ref 22–32)
CREATININE: 1.41 mg/dL — AB (ref 0.61–1.24)
Chloride: 82 mmol/L — ABNORMAL LOW (ref 98–111)
Chloride: 79 mmol/L — ABNORMAL LOW (ref 98–111)
Creatinine, Ser: 1.38 mg/dL — ABNORMAL HIGH (ref 0.61–1.24)
GFR calc Af Amer: >60 mL/min (ref >60)
GFR calc Af Amer: >60 mL/min (ref >60)
GFR calc non Af Amer: 52 mL/min — ABNORMAL LOW (ref >60)
GFR, EST NON AFRICAN AMERICAN: 54 mL/min — AB (ref >60)
GLUCOSE: 214 mg/dL — AB (ref 70–99)
GLUCOSE: 97 mg/dL (ref 70–99)
POTASSIUM: 2.8 mmol/L — AB (ref 3.5–5.1)
Potassium: 2.9 mmol/L — ABNORMAL LOW (ref 3.5–5.1)
Sodium: 127 mmol/L — ABNORMAL LOW (ref 135–145)
Sodium: 128 mmol/L — ABNORMAL LOW (ref 135–145)

## 2018-05-11 LAB — COOXEMETRY PANEL
CARBOXYHEMOGLOBIN: 2.2 % — AB (ref 0.5–1.5)
Methemoglobin: 1.3 % (ref 0.0–1.5)
O2 SAT: 57.7 %
Total hemoglobin: 8.8 g/dL — ABNORMAL LOW (ref 12.0–16.0)

## 2018-05-11 LAB — GLUCOSE, CAPILLARY
GLUCOSE-CAPILLARY: 110 mg/dL — AB (ref 70–99)
Glucose-Capillary: 209 mg/dL — ABNORMAL HIGH (ref 70–99)
Glucose-Capillary: 135 mg/dL — ABNORMAL HIGH (ref 70–99)
Glucose-Capillary: 85 mg/dL (ref 70–99)

## 2018-05-11 LAB — PROTIME-INR
INR: 2.17
Prothrombin Time: 24 seconds — ABNORMAL HIGH (ref 11.4–15.2)

## 2018-05-11 LAB — AMMONIA: Ammonia: 57 umol/L — ABNORMAL HIGH (ref 9–35)

## 2018-05-11 LAB — LACTATE DEHYDROGENASE: LDH: 137 U/L (ref 98–192)

## 2018-05-11 MED ORDER — LIDOCAINE HCL (PF) 2 % IJ SOLN
INTRAMUSCULAR | Status: AC | PRN
  Administered 2018-05-11: 10 mL

## 2018-05-11 MED ORDER — LIDOCAINE HCL (PF) 2 % IJ SOLN
INTRAMUSCULAR | Status: AC
  Filled 2018-05-11: qty 20

## 2018-05-11 MED ORDER — WARFARIN SODIUM 5 MG PO TABS
5.0000 mg | ORAL_TABLET | Freq: Once | ORAL | Status: AC
  Administered 2018-05-11: 5 mg via ORAL
  Filled 2018-05-11: qty 1

## 2018-05-11 MED ORDER — POTASSIUM CHLORIDE CRYS ER 20 MEQ PO TBCR: 60.0000 meq | EXTENDED_RELEASE_TABLET | Freq: Three times a day (TID) | ORAL | Status: DC

## 2018-05-11 MED ORDER — POTASSIUM CHLORIDE CRYS ER 20 MEQ PO TBCR: 40.0000 meq | EXTENDED_RELEASE_TABLET | Freq: Two times a day (BID) | ORAL | Status: DC

## 2018-05-11 MED ORDER — POTASSIUM CHLORIDE CRYS ER 20 MEQ PO TBCR
60.0000 meq | EXTENDED_RELEASE_TABLET | ORAL | Status: AC
  Administered 2018-05-11 (×3): 60 meq via ORAL
  Filled 2018-05-11 (×3): qty 3

## 2018-05-11 MED ORDER — POTASSIUM CHLORIDE CRYS ER 20 MEQ PO TBCR
40.0000 meq | EXTENDED_RELEASE_TABLET | Freq: Three times a day (TID) | ORAL | Status: DC
  Administered 2018-05-11: 40 meq via ORAL
  Filled 2018-05-11: qty 2

## 2018-05-11 NOTE — Procedures (Signed)
PROCEDURE SUMMARY:  Successful US guided paracentesis from right lateral abdomen.  Yielded 2 liters of clear yellow fluid.  No immediate complications.  Patient tolerated well.    Mitchell Iwanicki S Estoria Geary PA-C 05/11/2018 3:09 PM

## 2018-05-11 NOTE — Progress Notes (Addendum)
LVAD Coordinator Rounding Note:  Admitted 05/14/2018 to Dr. Haroldine Laws due to ascites and CHF with fluid overload.   HM III LVAD implanted on 09/09/16 by Dr. Cyndia Bent under Destination Therapy criteria.  Pt sleeping this am.   Vital signs: Temp:  97.9 HR: 80 Doppler Pressure:  80 Automatic BP:  Not done O2 Sat: 98 % on RA Wt:  209>215>212>210 lbs  LVAD interrogation reveals:  Speed: 5800 Flow: 5.5 Power: 4.9 w PI:  2.7 Alarms: none Events: 15 Pi events Hct: 30 Fixed speed:  5800 Low speed limit: 5500  Drive Line: left abdominal sorbaview dressing dry and intact; anchor intact and accurately applied.  Pt has an intolerance to CHG please use saline and betadine only.  Next dressing change due 05/16/18. Bedside nurse may change dressing.   Labs:  LDH trend: 357>017>793>903  INR trend: 2.79>2.70>2.47>2.17  Gtts: Dobutamine 7.5 mcg/kg/min Lasix 30 mg/hr  Anticoagulation Plan: -INR Goal:  1.8 - 2.3 -ASA Dose: no ASA (history of GI bleeding)  Device: -Protect; end of service 08/2016 -Therapies:  Off Last check: complete interrogation 05/26/2017. DDD (lower rate 60); BiV pacing with 96% AS-VP  Adverse Events on VAD: 11/30/16> Milrinone gtt at discharge 01/27/17>dobutamine at 5 mcg/kg/mingtt at discharge 03/08/17> RV failure 04/29/17> symptomatic anemia and melena. EGD revealed stomach ulcers. Carafate added; ASA stopped 05/31/17>Ongoing symptomatic anemia and melena; capsule study mild antral gastritis; poss tiny AVM right colon. Lower MAPS, Losartan cut back. 06/2017> admit with syncope, AMS, Dobutamine at 7.64mcg 11/09/17>Marked ascites, RV failure, and hepatic encephalopathy. Paracentesis on 2/7 and 2/12 with >11 liters fluid removed 12/27/17>Outpatient paracentesis with 2.8 liters removed 01/11/18>IP paracentesis with 4.8 liters removed 01/16/18>IP paracentesis w/2.4L removed 02/15/18>OP paracentesis w/ 5 L removed 02/23/18>IP paracentesis w/ 5.6 L removed   Plan/Recommendations:   1. Will need weekly dressing changes; due 05/16/18. Bedside RN may change dressing w/above instructions. 2. Planned paracentesis in IR today; please call VAD coordinator to meet you in IR to remain with pt during procedure.  3. Please call VAD pager with any VAD equipment or drive line issues.  Zada Girt RN, VAD Coordiantor 24/7 VAD pager: [phone]

## 2018-05-11 NOTE — Progress Notes (Signed)
VAD Coordinator Procedure Note:   VAD Coordinator met patient in IR for paracentesis. Local numbing only, no sedation used during procedure.    Time: Doppler Auto  BP Flow PI Power Speed  Pre-procedure:  11:30  115/96 (104) 5.4 2.8 4.5 5800  During procedure:  11:40  86/58 (67) 5.4 2.6 4.6    11:50  82/60 (69) 5.5 2.6 4.6            Post procedure: 12:00  78/68 (74) 5.5 2.5 4.5                                         Patient tolerated the procedure well. VAD Coordinator accompanied and remained with patient during procedure.   2 liters fluid removed from abdomen. Pt did c/o brief nausea during procedure, symptoms gone at time of transport back to room.    Patient Disposition: 2H57  Zada Girt RN, VAD Coordinator 24/7 VAD Pager: [phone]

## 2018-05-11 NOTE — Progress Notes (Addendum)
Advanced Heart Failure VAD Team Note  PCP-Cardiologist: Glori Bickers, MD   Subjective:    Sluggish UOP yesterday. + 60 mls. Weight is down 2 more lbs. Creatinine stable 1.41, K 2.9. CVP 27  Now on lasix drip @ 30 mg/hr + metolazone + diamox.   Remains on dobutamine 7.5 mcg/kg/min. Coox 57% this am.   Paracentesis 8/5 with 3.6 L off.  Hemoglobin 8.5, INR 2.14, LDH stable 137.   Feels like a "rotten dried up snake" this morning. No dizziness or bleeding. SOB on exertion. No orthopnea. Did not have a BM yesterday.Confused at times. Worsening asterixis.   LVAD INTERROGATION:  HeartMate 3 LVAD:   Flow 5.6 liters/min, speed 5800, power 5.0, PI 2.1. 16 PI events in 24/hrs. Personally reviewed.   Objective:    Vital Signs:   Temp:  [96.8 F (36 C)-97.9 F (36.6 C)] 97.9 F (36.6 C) (08/08 0409) Pulse Rate:  [59-93] 80 (08/08 0409) Resp:  [15-21] 17 (08/08 0409) BP: (92-158)/(75-146) 136/82 (08/08 0409) SpO2:  [93 %-100 %] 98 % (08/08 0409) Weight:  [95.8 kg] 95.8 kg (08/08 0500) Last BM Date: 05/10/18 Mean arterial Pressure 70-80s   Intake/Output:   Intake/Output Summary (Last 24 hours) at 05/11/2018 0752 Last data filed at 05/11/2018 0435 Gross per 24 hour  Intake 2178.1 ml  Output 2300 ml  Net -121.9 ml     Physical Exam    General:  Weak and chronically ill appearing. Confused at times  HEENT: Normal. Neck: Supple, JVP to jaw - prominent CV waves. Carotids OK.  Cardiac:  Mechanical heart sounds with LVAD hum present.  Lungs:  CTAB, normal effort. No wheeze  Abdomen:  +++ distended, NT, no HSM. No bruits or masses. +BS  LVAD exit site: Dressing dry and intact. No erythema or drainage. Stabilization device present and accurately applied. Extremities:  Warm and dry. No cyanosis, clubbing, rash, BLE 2-3+ edema, cool extremities. + asterixis.  Neuro:confused at times  cranial nerves grossly intact. moves all 4 extremities w/o difficulty. Affect  pleasant    Telemetry   AV paced 80s. Personally reviewed.   EKG    No new tracings.   Labs   Basic Metabolic Panel: Recent Labs  Lab 06/01/2018 0441 05/07/2018 1151 05/09/18 0643 05/10/18 0447 05/11/18 0422  NA 126* 126* 125* 125* 127*  K 5.7* 4.9 4.4 3.4* 2.9*  CL 90* 87* 85* 84* 82*  CO2 25 29 29 28  34*  GLUCOSE 188* 169* 221* 208* 214*  BUN 42* 44* 43* 39* 36*  CREATININE 1.66* 1.65* 1.53* 1.48* 1.41*  CALCIUM 8.8* 8.7* 8.2* 8.2* 8.6*    Liver Function Tests: Recent Labs  Lab 05/26/2018 0441  AST 31  ALT 22  ALKPHOS 102  BILITOT 1.1  PROT 7.1  ALBUMIN 3.4*   No results for input(s): LIPASE, AMYLASE in the last 168 hours. Recent Labs  Lab 05/09/2018 0441 05/09/18 0643  AMMONIA 81* 62*    CBC: Recent Labs  Lab 05/15/2018 0441 05/09/18 0643 05/10/18 0447 05/11/18 0422  WBC 8.1 6.9 6.3 6.0  NEUTROABS 6.9  --   --   --   HGB 9.0* 8.5* 8.5* 8.5*  HCT 31.7* 29.3* 30.0* 30.0*  MCV 79.1 77.5* 77.9* 78.7  PLT 173 133* 147* 166    INR: Recent Labs  Lab 05/04/18 0809 06/03/2018 0441 05/09/18 0643 05/10/18 0447 05/11/18 0422  INR 2.46 2.79 2.70 2.47 2.17    Other results:     Imaging   No results  found.   Medications:     Scheduled Medications: . acetaZOLAMIDE  250 mg Oral BID  . amiodarone  200 mg Oral Daily  . gabapentin  300 mg Oral BID  . insulin aspart  0-20 Units Subcutaneous TID WC  . insulin aspart  5 Units Subcutaneous TID WC  . lactulose  45 g Oral TID  . levothyroxine  50 mcg Oral QAC breakfast  . magnesium oxide  400 mg Oral Daily  . metolazone  5 mg Oral BID  . midodrine  10 mg Oral TID WC  . mupirocin cream   Topical Daily  . pantoprazole  40 mg Oral Daily  . sucralfate  1 g Oral TID WC & HS  . Warfarin - Pharmacist Dosing Inpatient   Does not apply q1800    Infusions: . DOBUTamine 7.5 mcg/kg/min (05/11/18 0435)  . furosemide (LASIX) infusion 30 mg/hr (05/11/18 0507)    PRN Medications: acetaminophen, docusate  sodium, ondansetron (ZOFRAN) IV, ondansetron, senna-docusate   Patient Profile   Ronald Milleris a 62 y.o.malewith history of chronic systolic HF s/p Medtronic ICD 2014, CAD s/p CABG in 2000, HTN, Hx of cocaine abuse, Tobacco abuse, Depression, PAF, and COPD. HM3 placed 09/09/2016. 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 He remains on dobutamine 7.91mcg.   Admitted 8/5 with A/C systolic heart failure   Assessment/Plan:     1. A/C Biventricular HF- ECHO 2018 Ramp ECHO  HMIII 09/2016. Continue Dobutamine 7.5 mcg chronically. Coox 58% Volume status remains elevated despite high dose diuretics and inotrope support. CVP 27. Continue lasix drip @ 30 mg/hr. Continue metolazone 5 mg BID and diamox 250 mg BID No spiro or potassium with hyperkalemia No BB with RV failure Continue midodrine 10 mg three times a day. MAPs generally 70-80s Continue coumadin. INR goal 2-2.5. INR 2.17. Per pharmacy.  He is not interested in palliative or hospice at this time. 2. HMIII VAD interrogated. 16 PI events/24 hours. LDH stable 137. PI's running on the low side.  No extrernal power alarms 8/5 am. Will need to follow up with [facility] staff.  3. Hyponatremia Received tolvaptan x1 8/6. Na improving 127. Limit free water.  4. Anemia Received 2UPRBCs on 8/1 for hgb 7.7. Hemoglobin 9.0 > 8.5 > 8.5 > 8.5. Denies bleeding.  5. Hyperkalemia K 5.7 on admit. Received IV lasix and sodum bicarb. K 2.9. Supp. 6. PAF Remains in NSR On coumadin.  7. Cirrhosis Lactulose increased 8/6. Had several BMs yesterday. He says he did not have a BM yesterday. May need to increase lactulose again today. Ammonia pending.  Repeat paracentesis today 8. RLE partial thickness wound. WOC following. Recommending roam dressing and bactroban. No change.    I reviewed the LVAD parameters from today, and compared the results  to the patient's prior recorded data.  No programming changes were made.  The LVAD is functioning within specified parameters.  The patient performs LVAD self-test daily.  LVAD interrogation was negative for any significant power changes, alarms or PI events/speed drops.  LVAD equipment check completed and is in good working order.  Back-up equipment present.   LVAD education done on emergency procedures and precautions and reviewed exit site care.  Length of Stay: [place], NP 05/11/2018, 7:52 AM  VAD Team --- VAD ISSUES ONLY--- Pager [phone] (7am - 7am)  Advanced Heart Failure Team  Pager [phone] (M-F; 7a - 4p)  Please contact [facility] for night-coverage after hours (4p -7a ) and weekends on [email or web address]  Patient seen and examined with the above-signed Advanced Practice Provider and/or Housestaff. I personally reviewed laboratory data, imaging studies and relevant notes. I independently examined the patient and formulated the important aspects of the plan. I have edited the note to reflect any of my changes or salient points. I have personally discussed the plan with the patient and/or family.  He continues to deteriorate. Volume remains markedly elevated. Only 2L off with paracentesis. Remains on dobutamine. Now on lasix gtt. Has worsening asterixis/hepatic encephalopathy. Continue lactulose and check ammonia. VAD interrogated personally. Parameters stable. INR 2.17. Discussed dosing with PharmD personally. He continues to refuse Hospice care but may be only option soon.   Glori Bickers, MD  2:06 PM

## 2018-05-11 NOTE — Progress Notes (Signed)
LVAD Coordinator Rounding Note:  Admitted 05/14/2018 to Dr. Haroldine Laws due to ascites and CHF with fluid overload.   HM III LVAD implanted on 09/09/16 by Dr. Cyndia Bent under Destination Therapy criteria.  Pt lying in bed, remains confused this am. PM diuretics held last evening due to MAPs in 50 - 60. Personally checked BP. Doppler MAP and auto cuff pressures do not correlate (see below) - asked nurse to use doppler only today and consider that his MAP. May use auto cuff if they correlate with Doppler readings.   Vital signs: Temp:  97.9 HR: 93 Doppler Pressure:  80 Automatic BP:  69/53 (59); re-check 49/21 (30) O2 Sat: 98 % on RA Wt:  209>215>212 lbs  LVAD interrogation reveals:  Speed: 5800 Flow: 5.5 Power: 4.4 w PI:  2.2 Alarms: none Events: 14 Pi events Hct: 29 Fixed speed:  5800 Low speed limit: 5500  Drive Line: left abdominal sorbaview dressing dry and intact; anchor intact and accurately applied.  Pt has an intolerance to CHG please use saline and betadine only.  Next dressing change due 05/16/18. Bedside nurse may change dressing.   Labs:  LDH trend: 537>482>707  INR trend: 2.79>2.70>2.47  Gtts: Dobutamine 7.5 mcg/kg/min  Anticoagulation Plan: -INR Goal:  1.8 - 2.3 -ASA Dose: no ASA (history of GI bleeding)  Device: -Protect; end of service 08/2016 -Therapies:  Off Last check: complete interrogation 05/26/2017. DDD (lower rate 60); BiV pacing with 96% AS-VP  Adverse Events on VAD: 11/30/16> Milrinone gtt at discharge 01/27/17>dobutamine at 5 mcg/kg/mingtt at discharge 03/08/17> RV failure 04/29/17> symptomatic anemia and melena. EGD revealed stomach ulcers. Carafate added; ASA stopped 05/31/17>Ongoing symptomatic anemia and melena; capsule study mild antral gastritis; poss tiny AVM right colon. Lower MAPS, Losartan cut back. 06/2017> admit with syncope, AMS, Dobutamine at 7.76mcg 11/09/17>Marked ascites, RV failure, and hepatic encephalopathy. Paracentesis on 2/7 and 2/12  with >11 liters fluid removed 12/27/17>Outpatient paracentesis with 2.8 liters removed 01/11/18>IP paracentesis with 4.8 liters removed 01/16/18>IP paracentesis w/2.4L removed 02/15/18>OP paracentesis w/ 5 L removed 02/23/18>IP paracentesis w/ 5.6 L removed   Plan/Recommendations:  1. Will need weekly dressing changes; due 05/16/18. Bedside RN may change dressing w/above instructions. 2. Please call VAD pager with any VAD equipment or drive line issues.  Zada Girt RN, VAD Coordiantor 24/7 VAD pager: [phone]

## 2018-05-11 NOTE — Progress Notes (Signed)
[place] for warfarin Indication: LVAD  Allergies  Allergen Reactions  . Chlorhexidine Gluconate Itching and Rash  . Codeine Nausea And Vomiting and Other (See Comments)    In high doses  . Lipitor [Atorvastatin] Nausea Only and Other (See Comments)    Nausea with high doses, tolerates 20mg  dose (08/22/16)  . Tape Other (See Comments)    Paper tape is ok    Patient Measurements: Height: 5\' 9"  (175.3 cm) Weight: 211 lb 3.2 oz (95.8 kg) IBW/kg (Calculated) : 70.7  Vital Signs: Temp: 97.9 F (36.6 C) (08/08 0409) Temp Source: Oral (08/08 0409) BP: 136/82 (08/08 0409) Pulse Rate: 80 (08/08 0409)  Labs: Recent Labs    05/09/18 0643 05/10/18 0447 05/11/18 0422  HGB 8.5* 8.5* 8.5*  HCT 29.3* 30.0* 30.0*  PLT 133* 147* 166  LABPROT 28.4* 26.6* 24.0*  INR 2.70 2.47 2.17  CREATININE 1.53* 1.48* 1.41*    Estimated Creatinine Clearance: 62.8 mL/min (A) (by C-G formula based on SCr of 1.41 mg/dL (H)).   Medical History: Past Medical History:  Diagnosis Date  . AICD (automatic cardioverter/defibrillator) present   . Anxiety   . ASCVD (arteriosclerotic cardiovascular disease)   . Chronic systolic CHF (congestive heart failure) ([place])   . COPD (chronic obstructive pulmonary disease) ([place])   . Coronary artery disease   . Depression   . GI bleed   . High cholesterol   . History of blood transfusion   . History of cocaine abuse   . Hypertension   . LVAD (left ventricular assist device) present ([place])   . Presence of permanent cardiac pacemaker   . Shortness of breath dyspnea   . Suicidal ideation   . Tobacco abuse   . Type II diabetes mellitus (HCC)      Assessment: 31 yoM s/p LVAD for ICM presents with SOB. Pharmacy consulted to resumed warfarin, last dose 8/4. INR slightly supratherapeutic on admit at 2.79.  INR at goal this morning at 2.1 after receiving a couple days of reduced doses. No bleeding issues noted. CBC/LDH  stable.   *PTA Warfarin Dose = 5mg  Mon/Wed/Fri, 7.5mg  Tues/Thurs/Sat/Sun  Goal of Therapy:  INR 1.8-2.4 Monitor platelets by anticoagulation protocol: Yes   Plan:  -Warfarin 5mg  PO x1 -Daily protime  Erin Hearing PharmD., BCPS Clinical Pharmacist 05/11/2018 8:14 AM

## 2018-05-12 LAB — PROTIME-INR
INR: 1.96
PROTHROMBIN TIME: 22.2 s — AB (ref 11.4–15.2)

## 2018-05-12 LAB — CBC
HEMATOCRIT: 30.3 % — AB (ref 39.0–52.0)
HEMOGLOBIN: 8.7 g/dL — AB (ref 13.0–17.0)
MCH: 22.4 pg — ABNORMAL LOW (ref 26.0–34.0)
MCHC: 28.7 g/dL — ABNORMAL LOW (ref 30.0–36.0)
MCV: 78.1 fL (ref 78.0–100.0)
Platelets: 176 10*3/uL (ref 150–400)
RBC: 3.88 MIL/uL — ABNORMAL LOW (ref 4.22–5.81)
RDW: 19.8 % — AB (ref 11.5–15.5)
WBC: 7 10*3/uL (ref 4.0–10.5)

## 2018-05-12 LAB — LACTATE DEHYDROGENASE: LDH: 133 U/L (ref 98–192)

## 2018-05-12 LAB — GLUCOSE, CAPILLARY
Glucose-Capillary: 209 mg/dL — ABNORMAL HIGH (ref 70–99)
Glucose-Capillary: 158 mg/dL — ABNORMAL HIGH (ref 70–99)
Glucose-Capillary: 111 mg/dL — ABNORMAL HIGH (ref 70–99)
Glucose-Capillary: 171 mg/dL — ABNORMAL HIGH (ref 70–99)

## 2018-05-12 LAB — BASIC METABOLIC PANEL
ANION GAP: 13 (ref 5–15)
BUN: 34 mg/dL — AB (ref 8–23)
CHLORIDE: 81 mmol/L — AB (ref 98–111)
CO2: 33 mmol/L — ABNORMAL HIGH (ref 22–32)
Calcium: 8.5 mg/dL — ABNORMAL LOW (ref 8.9–10.3)
Creatinine, Ser: 1.35 mg/dL — ABNORMAL HIGH (ref 0.61–1.24)
GFR calc Af Amer: >60 mL/min (ref >60)
GFR, EST NON AFRICAN AMERICAN: 55 mL/min — AB (ref >60)
GLUCOSE: 151 mg/dL — AB (ref 70–99)
POTASSIUM: 3.6 mmol/L (ref 3.5–5.1)
Sodium: 127 mmol/L — ABNORMAL LOW (ref 135–145)

## 2018-05-12 LAB — COOXEMETRY PANEL
Carboxyhemoglobin: 2.3 % — ABNORMAL HIGH (ref 0.5–1.5)
Methemoglobin: 1.7 % — ABNORMAL HIGH (ref 0.0–1.5)
O2 Saturation: 62.7 %
Total hemoglobin: 8.2 g/dL — ABNORMAL LOW (ref 12.0–16.0)

## 2018-05-12 MED ORDER — POTASSIUM CHLORIDE CRYS ER 20 MEQ PO TBCR
40.0000 meq | EXTENDED_RELEASE_TABLET | Freq: Once | ORAL | Status: AC
  Administered 2018-05-12: 40 meq via ORAL
  Filled 2018-05-12: qty 2

## 2018-05-12 MED ORDER — WARFARIN SODIUM 5 MG PO TABS
5.0000 mg | ORAL_TABLET | Freq: Once | ORAL | Status: AC
  Administered 2018-05-12: 5 mg via ORAL
  Filled 2018-05-12: qty 1

## 2018-05-12 NOTE — Progress Notes (Addendum)
Advanced Heart Failure VAD Team Note  PCP-Cardiologist: Glori Bickers, MD   Subjective:    Improved UOP with 2.1 L out. Weight down ~6 more lbs. Cr 1.35. K 3.6. CVP 22-23  Remains on lasix drip @ 30 mg/hr + metolazone + diamox.   Remains on dobutamine 7.5 mcg/kg/min. Coox 62.7% this am.   Paracentesis 8/5 with 3.6 L off.  Hemoglobin 8.7, INR 1.96. LDH 133. Ammonia 57 05/11/18  Frustrated this am.  Wants to go home. Sat and had long discussion about his condition and his prognosis. Despite multiple documented conversations, he states no one has told him how bad off he is. He is able to verbalize understanding that he is on maximal medical therapy, and there are no additional durable options left to him. He is understandably upset, and asks [place] to speak to his sister. Family meeting already planned for this afternoon. Feels bloated but denies SOB. No orthopnea or PND.   LVAD Interrogation HM 3: Speed: 5800 Flow: 5.6 PI: 2.9 Power: 4.0. >20 PI events   Objective:    Vital Signs:   Temp:  [97.5 F (36.4 C)-97.7 F (36.5 C)] 97.6 F (36.4 C) (08/09 0325) Resp:  [13-23] 16 (08/09 0325) BP: (67-78)/(51-68) 67/51 (08/08 2313) Weight:  [92.4 kg] 92.4 kg (08/09 0347) Last BM Date: 05/11/18 Mean arterial Pressure 70-80s   Intake/Output:   Intake/Output Summary (Last 24 hours) at 05/12/2018 0843 Last data filed at 05/12/2018 0649 Gross per 24 hour  Intake 1600 ml  Output 3075 ml  Net -1475 ml     Physical Exam    General: Weak and chronically ill appearing.  HEENT: Normal. Anicteric Neck: Supple, JVP to jaw. Prominent CV waves. Carotids OK.  Cardiac:  Mechanical heart sounds with LVAD hum present.  Lungs:  CTAB, normal effort. No wheeze Abdomen:  +distended, no HSM. No bruits or masses. +BS  LVAD exit site: Dressing dry and intact. No erythema or drainage. Stabilization device present and accurately applied. Driveline dressing changed daily per sterile technique. Extremities:   Warm and dry. No cyanosis, clubbing, or rash. 2-3+ BLE edema to knees. Cool extremities. + asterixis. Neuro: Confused at times. Cranial nerves grossly intact. Moves all 4 extremities w/o difficulty. Affect pleasant     Telemetry   AV paced 80s, personally reviewed.   EKG    No new tracings.    Labs   Basic Metabolic Panel: Recent Labs  Lab 05/09/18 0643 05/10/18 0447 05/11/18 0422 05/11/18 1428 05/12/18 0339  NA 125* 125* 127* 128* 127*  K 4.4 3.4* 2.9* 2.8* 3.6  CL 85* 84* 82* 79* 81*  CO2 29 28 34* 37* 33*  GLUCOSE 221* 208* 214* 97 151*  BUN 43* 39* 36* 36* 34*  CREATININE 1.53* 1.48* 1.41* 1.38* 1.35*  CALCIUM 8.2* 8.2* 8.6* 8.5* 8.5*    Liver Function Tests: Recent Labs  Lab 05/27/2018 0441  AST 31  ALT 22  ALKPHOS 102  BILITOT 1.1  PROT 7.1  ALBUMIN 3.4*   No results for input(s): LIPASE, AMYLASE in the last 168 hours. Recent Labs  Lab 05/07/2018 0441 05/09/18 0643 05/11/18 1428  AMMONIA 81* 62* 57*    CBC: Recent Labs  Lab 05/28/2018 0441 05/09/18 0643 05/10/18 0447 05/11/18 0422 05/12/18 0339  WBC 8.1 6.9 6.3 6.0 7.0  NEUTROABS 6.9  --   --   --   --   HGB 9.0* 8.5* 8.5* 8.5* 8.7*  HCT 31.7* 29.3* 30.0* 30.0* 30.3*  MCV 79.1 77.5*  77.9* 78.7 78.1  PLT 173 133* 147* 166 176    INR: Recent Labs  Lab 05/19/2018 0441 05/09/18 0643 05/10/18 0447 05/11/18 0422 05/12/18 0339  INR 2.79 2.70 2.47 2.17 1.96    Other results:     Imaging   Ir Paracentesis  Result Date: 05/11/2018 INDICATION: Recurrent ascites. History of CHF with LVAD. Request for therapeutic paracentesis. EXAM: ULTRASOUND GUIDED PARACENTESIS MEDICATIONS: 2% lidocaine 10 mL COMPLICATIONS: None immediate. PROCEDURE: Informed written consent was obtained from the patient after a discussion of the risks, benefits and alternatives to treatment. A timeout was performed prior to the initiation of the procedure. Initial ultrasound scanning demonstrates a moderate amount of ascites  within the right lower abdominal quadrant. The right lower abdomen was prepped and draped in the usual sterile fashion. 1% lidocaine with epinephrine was used for local anesthesia. Following this, a 19 gauge, 7-cm, Yueh catheter was introduced. An ultrasound image was saved for documentation purposes. The paracentesis was performed. The catheter was removed and a dressing was applied. The patient tolerated the procedure well without immediate post procedural complication. FINDINGS: A total of approximately 2 L of clear yellow fluid was removed. IMPRESSION: Successful ultrasound-guided paracentesis yielding 2 liters of peritoneal fluid. Read by: Gareth Eagle, PA-C Electronically Signed   By: Lucrezia Europe M.D.   On: 05/11/2018 15:08     Medications:     Scheduled Medications: . acetaZOLAMIDE  250 mg Oral BID  . amiodarone  200 mg Oral Daily  . gabapentin  300 mg Oral BID  . insulin aspart  0-20 Units Subcutaneous TID WC  . insulin aspart  5 Units Subcutaneous TID WC  . lactulose  45 g Oral TID  . levothyroxine  50 mcg Oral QAC breakfast  . magnesium oxide  400 mg Oral Daily  . metolazone  5 mg Oral BID  . midodrine  10 mg Oral TID WC  . mupirocin cream   Topical Daily  . pantoprazole  40 mg Oral Daily  . sucralfate  1 g Oral TID WC & HS  . Warfarin - Pharmacist Dosing Inpatient   Does not apply q1800    Infusions: . DOBUTamine 7.5 mcg/kg/min (05/11/18 2050)  . furosemide (LASIX) infusion 30 mg/hr (05/12/18 0758)    PRN Medications: acetaminophen, docusate sodium, ondansetron (ZOFRAN) IV, ondansetron, senna-docusate   Patient Profile   Ronald Milleris a 62 y.o.malewith history of chronic systolic HF s/p [employer] ICD 2014, CAD s/p CABG in 2000, HTN, Hx of cocaine abuse, Tobacco abuse, Depression, PAF, and COPD. HM3 placed 09/09/2016. 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 He remains  on dobutamine 7.50mcg.   Admitted 8/5 with A/C systolic heart failure   Assessment/Plan:    1. A/C Biventricular HF- ECHO 2018 Ramp ECHO  - HMIII 09/2016. Continue Dobutamine 7.5 mcg chronically. Coox 62.7% - Volume status slightly improved with paracentesis but remains elevated. CVP 22 - Continue lasix drip @ 30 mg/hr. Continue metolazone 5 mg BID and diamox 250 mg BID - No spiro or potassium with hyperkalemia - No BB with RV failure - Continue midodrine 10 mg three times a day. MAPs generally 70-80s - Continue coumadin. INR goal 2-2.5. INR 2.17. Per pharmacy.  - He is not interested in palliative or hospice at this time. 2. HMIII - VAD interrogated personally.  - LDH stable 133. PI's running on the low side.  - No extrernal power  alarms 8/5 am. Will need to follow up with [facility] staff.  3. Hyponatremia - Received tolvaptan x1 8/6. Na improving 127 this am. Limit free water.  4. Anemia - Received 2UPRBCs on 8/1 for hgb 7.7. Hemoglobin 9.0 > 8.5 > 8.5 > 8.5. Denies bleeding.  5. Hyperkalemia - K 3.6 today.  6. PAF - Remains in NSR.  - On coumadin.  7. Cirrhosis - Lactulose increased 8/6. Had several BMs yesterday. He says he did not have a BM yesterday. May need to increase lactulose again today. Ammonia pending.  - Repeat paracentesis 05/11/18 with 2 L of fluid off.  8. RLE partial thickness wound.  - WOC following. Recommending roam dressing and bactroban. No change.  9. Confusion - Remains intermittently confused.  - Relatively clear overall this am, but unable to figure out how to work his phone and dial his sisters number, or type her name.   Prognosis poor over even short term. Plan for family meeting this afternoon.   I reviewed the LVAD parameters from today, and compared the results to the patient's prior recorded data.  No programming changes were made.  The LVAD is functioning within specified parameters.  The patient performs LVAD self-test daily.  LVAD  interrogation was negative for any significant power changes, alarms or PI events/speed drops.  LVAD equipment check completed and is in good working order.  Back-up equipment present. LVAD education done on emergency procedures and precautions and reviewed exit site care.   Length of Stay: 4  Annamaria Helling 05/12/2018, 8:43 AM  VAD Team --- VAD ISSUES ONLY--- Pager [phone] (7am - 7am)  Advanced Heart Failure Team  Pager [phone] (M-F; 7a - 4p)  Please contact [facility] for night-coverage after hours (4p -7a ) and weekends on [email or web address]  Patient seen and examined with the above-signed Advanced Practice Provider and/or Housestaff. I personally reviewed laboratory data, imaging studies and relevant notes. I independently examined the patient and formulated the important aspects of the plan. I have edited the note to reflect any of my changes or salient points. I have personally discussed the plan with the patient and/or family.  He continues to struggle. Remains on high-dose dobutamine. CVP remains > 20 despite aggressive diuresis efforts.That said we seem to be finally making some progress today. He also has ben struggling with recurrent hepatic encephalopathy. Lactulose has been increased.   Overall continues to deteriorate despite VAD support and high-dose inotropes for RV support. He is near or at end-stage but he continues to be in denial about his prognosis and this is complicated by severe [id number] despite residing in SNF. We will have family meeting today to discuss next steps. VAD interrogated personally. Parameters stable.   Glori Bickers, MD  1:33 PM

## 2018-05-12 NOTE — Progress Notes (Signed)
[place] for warfarin Indication: LVAD  Allergies  Allergen Reactions  . Chlorhexidine Gluconate Itching and Rash  . Codeine Nausea And Vomiting and Other (See Comments)    In high doses  . Lipitor [Atorvastatin] Nausea Only and Other (See Comments)    Nausea with high doses, tolerates 20mg  dose (08/22/16)  . Tape Other (See Comments)    Paper tape is ok    Patient Measurements: Height: 5\' 9"  (175.3 cm) Weight: 203 lb 11.3 oz (92.4 kg) IBW/kg (Calculated) : 70.7  Vital Signs: Temp: 97.6 F (36.4 C) (08/09 0325) Temp Source: Oral (08/09 0325) BP: 67/51 (08/08 2313)  Labs: Recent Labs    05/10/18 0447 05/11/18 0422 05/11/18 1428 05/12/18 0339  HGB 8.5* 8.5*  --  8.7*  HCT 30.0* 30.0*  --  30.3*  PLT 147* 166  --  176  LABPROT 26.6* 24.0*  --  22.2*  INR 2.47 2.17  --  1.96  CREATININE 1.48* 1.41* 1.38* 1.35*    Estimated Creatinine Clearance: 64.5 mL/min (A) (by C-G formula based on SCr of 1.35 mg/dL (H)).   Medical History: Past Medical History:  Diagnosis Date  . AICD (automatic cardioverter/defibrillator) present   . Anxiety   . ASCVD (arteriosclerotic cardiovascular disease)   . Chronic systolic CHF (congestive heart failure) ([place])   . COPD (chronic obstructive pulmonary disease) ([name])   . Coronary artery disease   . Depression   . GI bleed   . High cholesterol   . History of blood transfusion   . History of cocaine abuse   . Hypertension   . LVAD (left ventricular assist device) present ([place])   . Presence of permanent cardiac pacemaker   . Shortness of breath dyspnea   . Suicidal ideation   . Tobacco abuse   . Type II diabetes mellitus (HCC)      Assessment: 49 yoM s/p LVAD for ICM presents with SOB. Pharmacy consulted to resumed warfarin, last dose 8/4. INR slightly supratherapeutic on admit at 2.79.  INR at goal this morning at 1.9 after receiving a couple days of reduced doses. No bleeding issues  noted. CBC/LDH stable.   *PTA Warfarin Dose = 5mg  Mon/Wed/Fri, 7.5mg  Tues/Thurs/Sat/Sun  Goal of Therapy:  INR 1.8-2.4 Monitor platelets by anticoagulation protocol: Yes   Plan:  -Warfarin 5mg  PO x1 -Daily protime  Erin Hearing PharmD., BCPS Clinical Pharmacist 05/12/2018 9:29 AM

## 2018-05-12 NOTE — Progress Notes (Signed)
LVAD Coordinator Rounding Note:  Admitted 05/17/2018 to Dr. Haroldine Laws due to ascites and CHF with fluid overload.   HM III LVAD implanted on 09/09/16 by Dr. Cyndia Bent under Destination Therapy criteria.  Pt sitting up on side of bed eating breakfast.   Vital signs: Temp:  97.7 HR: 83 Doppler Pressure:  76 Automatic BP:  Not done O2 Sat: 98 % on RA Wt:  209>215>212>210>203lb  LVAD interrogation reveals:  Speed: 5800 Flow: 5.6 Power: 4.5 w PI:  2.7 Alarms: none  Events: 6 PI events Hct: 30 Fixed speed:  5800  Low speed limit: 5500  Drive Line: left abdominal sorbaview dressing dry and intact; anchor intact and accurately applied.  Pt has an intolerance to CHG please use saline and betadine only.  Next dressing change due 05/16/18. Bedside nurse may change dressing.   Labs:  LDH trend: 837>290>211>155  INR trend: 2.79>2.70>2.47>2.17>1.96  Gtts: Dobutamine 7.5 mcg/kg/min Lasix 30 mg/hr  Anticoagulation Plan: -INR Goal:  1.8 - 2.3 -ASA Dose: no ASA (history of GI bleeding)  Device: -Protect; end of service 08/2016 -Therapies:  Off Last check: complete interrogation 05/26/2017. DDD (lower rate 60); BiV pacing with 96% AS-VP  Adverse Events on VAD: 11/30/16> Milrinone gtt at discharge 01/27/17>dobutamine at 5 mcg/kg/mingtt at discharge 03/08/17> RV failure 04/29/17> symptomatic anemia and melena. EGD revealed stomach ulcers. Carafate added; ASA stopped 05/31/17>Ongoing symptomatic anemia and melena; capsule study mild antral gastritis; poss tiny AVM right colon. Lower MAPS, Losartan cut back. 06/2017> admit with syncope, AMS, Dobutamine at 7.34mcg 11/09/17>Marked ascites, RV failure, and hepatic encephalopathy. Paracentesis on 2/7 and 2/12 with >11 liters fluid removed 12/27/17>Outpatient paracentesis with 2.8 liters removed 01/11/18>IP paracentesis with 4.8 liters removed 01/16/18>IP paracentesis w/2.4L removed 02/15/18>OP paracentesis w/ 5 L removed 02/23/18>IP paracentesis w/ 5.6 L  removed   Plan/Recommendations:  1. Will need weekly dressing changes; due 05/16/18. Bedside RN may change dressing w/above instructions.  2. Please call VAD pager with any VAD equipment or drive  Emerson Monte RN VAD Coordiantor 24/7 VAD pager: [phone]

## 2018-05-13 LAB — BASIC METABOLIC PANEL
Anion gap: 13 (ref 5–15)
BUN: 33 mg/dL — ABNORMAL HIGH (ref 8–23)
CO2: 34 mmol/L — AB (ref 22–32)
Calcium: 8.3 mg/dL — ABNORMAL LOW (ref 8.9–10.3)
Chloride: 77 mmol/L — ABNORMAL LOW (ref 98–111)
Creatinine, Ser: 1.45 mg/dL — ABNORMAL HIGH (ref 0.61–1.24)
GFR calc Af Amer: 59 mL/min — ABNORMAL LOW (ref >60)
GFR calc non Af Amer: 51 mL/min — ABNORMAL LOW (ref >60)
GLUCOSE: 180 mg/dL — AB (ref 70–99)
POTASSIUM: 2.6 mmol/L — AB (ref 3.5–5.1)
Sodium: 124 mmol/L — ABNORMAL LOW (ref 135–145)

## 2018-05-13 LAB — PROTIME-INR
INR: 1.74
Prothrombin Time: 20.2 seconds — ABNORMAL HIGH (ref 11.4–15.2)

## 2018-05-13 LAB — GLUCOSE, CAPILLARY
GLUCOSE-CAPILLARY: 128 mg/dL — AB (ref 70–99)
GLUCOSE-CAPILLARY: 170 mg/dL — AB (ref 70–99)
Glucose-Capillary: 191 mg/dL — ABNORMAL HIGH (ref 70–99)
Glucose-Capillary: 194 mg/dL — ABNORMAL HIGH (ref 70–99)
Glucose-Capillary: 125 mg/dL — ABNORMAL HIGH (ref 70–99)

## 2018-05-13 LAB — CBC
HEMATOCRIT: 29.4 % — AB (ref 39.0–52.0)
HEMOGLOBIN: 8.5 g/dL — AB (ref 13.0–17.0)
MCH: 22.3 pg — ABNORMAL LOW (ref 26.0–34.0)
MCHC: 28.9 g/dL — AB (ref 30.0–36.0)
MCV: 77.2 fL — ABNORMAL LOW (ref 78.0–100.0)
Platelets: 172 10*3/uL (ref 150–400)
RBC: 3.81 MIL/uL — ABNORMAL LOW (ref 4.22–5.81)
RDW: 19.6 % — ABNORMAL HIGH (ref 11.5–15.5)
WBC: 5.6 10*3/uL (ref 4.0–10.5)

## 2018-05-13 LAB — LACTATE DEHYDROGENASE: LDH: 130 U/L (ref 98–192)

## 2018-05-13 MED ORDER — MIDODRINE HCL 5 MG PO TABS
15.0000 mg | ORAL_TABLET | Freq: Three times a day (TID) | ORAL | Status: DC
  Administered 2018-05-13: 15 mg via ORAL
  Administered 2018-05-13: 5 mg via ORAL
  Administered 2018-05-13 – 2018-05-15 (×7): 15 mg via ORAL
  Filled 2018-05-13 (×6): qty 3

## 2018-05-13 MED ORDER — WARFARIN SODIUM 7.5 MG PO TABS
7.5000 mg | ORAL_TABLET | Freq: Once | ORAL | Status: AC
  Administered 2018-05-13: 7.5 mg via ORAL
  Filled 2018-05-13: qty 1

## 2018-05-13 MED ORDER — POTASSIUM CHLORIDE CRYS ER 20 MEQ PO TBCR
40.0000 meq | EXTENDED_RELEASE_TABLET | ORAL | Status: AC
  Administered 2018-05-13 (×4): 40 meq via ORAL
  Filled 2018-05-13 (×4): qty 2

## 2018-05-13 MED ORDER — SPIRONOLACTONE 12.5 MG HALF TABLET
12.5000 mg | ORAL_TABLET | Freq: Every day | ORAL | Status: DC
  Administered 2018-05-13 – 2018-05-14 (×2): 12.5 mg via ORAL
  Filled 2018-05-13 (×2): qty 1

## 2018-05-13 NOTE — Progress Notes (Signed)
[place] for warfarin Indication: LVAD  Allergies  Allergen Reactions  . Chlorhexidine Gluconate Itching and Rash  . Codeine Nausea And Vomiting and Other (See Comments)    In high doses  . Lipitor [Atorvastatin] Nausea Only and Other (See Comments)    Nausea with high doses, tolerates 20mg  dose (08/22/16)  . Tape Other (See Comments)    Paper tape is ok    Patient Measurements: Height: 5\' 9"  (175.3 cm) Weight: 202 lb 6.1 oz (91.8 kg) IBW/kg (Calculated) : 70.7  Vital Signs: Temp: 97.5 F (36.4 C) (08/10 0100) Temp Source: Oral (08/10 0100) BP: 78/52 (08/10 0100) Pulse Rate: 81 (08/10 0100)  Labs: Recent Labs    05/11/18 0422 05/11/18 1428 05/12/18 0339 05/13/18 0500  HGB 8.5*  --  8.7* 8.5*  HCT 30.0*  --  30.3* 29.4*  PLT 166  --  176 172  LABPROT 24.0*  --  22.2* 20.2*  INR 2.17  --  1.96 1.74  CREATININE 1.41* 1.38* 1.35* 1.45*    Estimated Creatinine Clearance: 59.9 mL/min (A) (by C-G formula based on SCr of 1.45 mg/dL (H)).   Medical History: Past Medical History:  Diagnosis Date  . AICD (automatic cardioverter/defibrillator) present   . Anxiety   . ASCVD (arteriosclerotic cardiovascular disease)   . Chronic systolic CHF (congestive heart failure) ([place])   . COPD (chronic obstructive pulmonary disease) ([place])   . Coronary artery disease   . Depression   . GI bleed   . High cholesterol   . History of blood transfusion   . History of cocaine abuse   . Hypertension   . LVAD (left ventricular assist device) present ([place])   . Presence of permanent cardiac pacemaker   . Shortness of breath dyspnea   . Suicidal ideation   . Tobacco abuse   . Type II diabetes mellitus (HCC)      Assessment: 49 yoM s/p LVAD for ICM presents with SOB. Pharmacy consulted to resumed warfarin, last dose 8/4. INR slightly supratherapeutic on admit at 2.79.  INR at goal this morning at 1.74 after receiving a couple days of reduced  doses. No bleeding issues noted. CBC/LDH stable.   *PTA Warfarin Dose = 5mg  Mon/Wed/Fri, 7.5mg  Tues/Thurs/Sat/Sun  Goal of Therapy:  INR 1.8-2.4 Monitor platelets by anticoagulation protocol: Yes   Plan:  -Warfarin 7.5mg  PO x1 -Daily PT/INR  Marguerite Olea, [facility] Clinical Pharmacist Phone [phone]  05/13/2018 8:01 AM

## 2018-05-13 NOTE — Progress Notes (Signed)
CRITICAL VALUE ALERT  Critical Value:  Potassium 2.6  Date & Time Notied:  05/13/2018  0615  Provider Notified: [facility]  Orders Received/Actions taken: Awaiting

## 2018-05-13 NOTE — Progress Notes (Signed)
Advanced Heart Failure VAD Team Note  PCP-Cardiologist: Glori Bickers, MD   Subjective:    Remains on lasix drip @ 30 mg/hr + metolazone + diamox. Weight down about 1.5 pounds. Still with high po intake. ~ 3L   Remains on dobutamine 7.5 mcg/kg/min. Coox not done this am.   Paracentesis 8/5 with 3.6 L off.  Long discussion with him and his family yesterday about continued decline and possible switch to comfort care. Has not decided yet.  Feels weak. Frustrated. No orthopnea or PND. Asking for more fruit   CVP 14 MAP 64 (both checked personally)   INR 1.7. K 2.6 LDH 130   LVAD Interrogation HM 3: Speed: 5800 Flow: 5.5 PI: 2.5 Power: 5.0. + PI events  Objective:    Vital Signs:   Temp:  [97.5 F (36.4 C)-98.4 F (36.9 C)] 97.7 F (36.5 C) (08/10 0810) Pulse Rate:  [81-83] 83 (08/10 0810) Resp:  [14-22] 22 (08/10 0810) BP: (71-78)/(41-52) 71/41 (08/10 0810) Weight:  [91.8 kg] 91.8 kg (08/10 0432) Last BM Date: 05/12/18 Mean arterial Pressure 60s   Intake/Output:   Intake/Output Summary (Last 24 hours) at 05/13/2018 0840 Last data filed at 05/13/2018 0700 Gross per 24 hour  Intake 2428.32 ml  Output 1826 ml  Net 602.32 ml     Physical Exam    General:  Weak. Chronically-ill appearing  HEENT: normal  Neck: supple. JVP to jaw  Carotids 2+ bilat; no bruits. No lymphadenopathy or thryomegaly appreciated. Cor: LVAD hum.  Lungs: Clear. Abdomen: obese soft, nontender, + distended. No hepatosplenomegaly. No bruits or masses. Good bowel sounds. Driveline site clean. Anchor in place.  Extremities: no cyanosis, clubbing, rash. 2+ edema cool Neuro: alert & oriented x 3. No focal deficits. Moves all 4 without problem  Mild flap   Telemetry   AV paced 80s, personally reviewed.   EKG    No new tracings.    Labs   Basic Metabolic Panel: Recent Labs  Lab 05/10/18 0447 05/11/18 0422 05/11/18 1428 05/12/18 0339 05/13/18 0500  NA 125* 127* 128* 127* 124*  K  3.4* 2.9* 2.8* 3.6 2.6*  CL 84* 82* 79* 81* 77*  CO2 28 34* 37* 33* 34*  GLUCOSE 208* 214* 97 151* 180*  BUN 39* 36* 36* 34* 33*  CREATININE 1.48* 1.41* 1.38* 1.35* 1.45*  CALCIUM 8.2* 8.6* 8.5* 8.5* 8.3*    Liver Function Tests: Recent Labs  Lab 05/12/2018 0441  AST 31  ALT 22  ALKPHOS 102  BILITOT 1.1  PROT 7.1  ALBUMIN 3.4*   No results for input(s): LIPASE, AMYLASE in the last 168 hours. Recent Labs  Lab 05/31/2018 0441 05/09/18 0643 05/11/18 1428  AMMONIA 81* 62* 57*    CBC: Recent Labs  Lab 05/22/2018 0441 05/09/18 0643 05/10/18 0447 05/11/18 0422 05/12/18 0339 05/13/18 0500  WBC 8.1 6.9 6.3 6.0 7.0 5.6  NEUTROABS 6.9  --   --   --   --   --   HGB 9.0* 8.5* 8.5* 8.5* 8.7* 8.5*  HCT 31.7* 29.3* 30.0* 30.0* 30.3* 29.4*  MCV 79.1 77.5* 77.9* 78.7 78.1 77.2*  PLT 173 133* 147* 166 176 172    INR: Recent Labs  Lab 05/09/18 0643 05/10/18 0447 05/11/18 0422 05/12/18 0339 05/13/18 0500  INR 2.70 2.47 2.17 1.96 1.74    Other results:     Imaging   Ir Paracentesis  Result Date: 05/11/2018 INDICATION: Recurrent ascites. History of CHF with LVAD. Request for therapeutic paracentesis. EXAM: ULTRASOUND  GUIDED PARACENTESIS MEDICATIONS: 2% lidocaine 10 mL COMPLICATIONS: None immediate. PROCEDURE: Informed written consent was obtained from the patient after a discussion of the risks, benefits and alternatives to treatment. A timeout was performed prior to the initiation of the procedure. Initial ultrasound scanning demonstrates a moderate amount of ascites within the right lower abdominal quadrant. The right lower abdomen was prepped and draped in the usual sterile fashion. 1% lidocaine with epinephrine was used for local anesthesia. Following this, a 19 gauge, 7-cm, Yueh catheter was introduced. An ultrasound image was saved for documentation purposes. The paracentesis was performed. The catheter was removed and a dressing was applied. The patient tolerated the  procedure well without immediate post procedural complication. FINDINGS: A total of approximately 2 L of clear yellow fluid was removed. IMPRESSION: Successful ultrasound-guided paracentesis yielding 2 liters of peritoneal fluid. Read by: Gareth Eagle, PA-C Electronically Signed   By: Lucrezia Europe M.D.   On: 05/11/2018 15:08     Medications:     Scheduled Medications: . acetaZOLAMIDE  250 mg Oral BID  . amiodarone  200 mg Oral Daily  . gabapentin  300 mg Oral BID  . insulin aspart  0-20 Units Subcutaneous TID WC  . insulin aspart  5 Units Subcutaneous TID WC  . lactulose  45 g Oral TID  . levothyroxine  50 mcg Oral QAC breakfast  . magnesium oxide  400 mg Oral Daily  . metolazone  5 mg Oral BID  . midodrine  10 mg Oral TID WC  . mupirocin cream   Topical Daily  . pantoprazole  40 mg Oral Daily  . potassium chloride  40 mEq Oral Q4H  . sucralfate  1 g Oral TID WC & HS  . warfarin  7.5 mg Oral ONCE-1800  . Warfarin - Pharmacist Dosing Inpatient   Does not apply q1800    Infusions: . DOBUTamine 7.5 mcg/kg/min (05/12/18 2306)  . furosemide (LASIX) infusion 30 mg/hr (05/13/18 0226)    PRN Medications: acetaminophen, docusate sodium, ondansetron (ZOFRAN) IV, ondansetron, senna-docusate   Patient Profile   JONCARLO FRIBERG Jr.is a 62 y.o.malewith history of chronic systolic HF s/p Medtronic ICD 2014, CAD s/p CABG in 2000, HTN, Hx of cocaine abuse, Tobacco abuse, Depression, PAF, and COPD. HM3 placed 09/09/2016. 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 He remains on dobutamine 7.52mcg.   Admitted 8/5 with A/C systolic heart failure   Assessment/Plan:    1. A/C Biventricular HF- ECHO 2018 Ramp ECHO  - HMIII 09/2016. Continue Dobutamine 7.5 mcg chronically for RV failure  - He continues to deteriorate with severe volume overload and low MAPs - Volume status improving slowly with with  paracentesis, lasix drip @ 30 mg/hr, metolazone 5 mg BID and diamox 250 mg BID - CVP down to 14.  - Will not comply with fluid restriction - K low to 2.6. Will supp and add back spiro while in house - No BB with RV failure - MAPs persistently in 60. Increase midodrine to 15 mg three times a day.  - Continue coumadin. INR goal 2-2.5. INR 1.74. Warfarin increased. If not 1.8 or greater tomorrow will need heparin. Discussed dosing with PharmD personally. -Overall continues to deteriorate despite VAD support and high-dose inotropes for RV support. He is near or at end-stage but he continues to be in denial about his prognosis and this is complicated by severe noncompliance despite residing in SNF. Long  discussion with him and his family yesterday about continued decline and possible switch to comfort care. Has not decided yet. 2. HMIII - VAD interrogated personally. Parameters stable. - LDH stable 130 PI's running on the low side.  3. Hyponatremia - Received tolvaptan x1 8/6. Na improving 124 this am. Limit free water. Can repeat tolvaptan as needed 4. Anemia - Received 2UPRBCs on 8/1 for hgb 7.7. Hemoglobin 9.0 > 8.5 > 8.5 > 8.5. Denies bleeding.  5. Hyperkalemia/hypokalemia - K 2.6 today.  6. PAF - Remains in NSR.  - On coumadin.  7. Cirrhosis with hepatic encephalopathy - Lactulose increased 8/6. Mildly improved. Recheck ammonia in am  - Repeat paracentesis 05/11/18 with 2 L of fluid off.  8. RLE partial thickness wound.  - WOC following. Recommending roam dressing and bactroban. No change.   I reviewed the LVAD parameters from today, and compared the results to the patient's prior recorded data.  No programming changes were made.  The LVAD is functioning within specified parameters.  The patient performs LVAD self-test daily.  LVAD interrogation was negative for any significant power changes, alarms or PI events/speed drops.  LVAD equipment check completed and is in good working order.   Back-up equipment present. LVAD education done on emergency procedures and precautions and reviewed exit site care.   Length of Stay: [place], MD 05/13/2018, 8:40 AM  VAD Team --- VAD ISSUES ONLY--- Pager [phone] (7am - 7am)  Advanced Heart Failure Team  Pager [phone] (M-F; 7a - 4p)  Please contact [facility] for night-coverage after hours (4p -7a ) and weekends on [email or web address]

## 2018-05-14 LAB — GLUCOSE, CAPILLARY
GLUCOSE-CAPILLARY: 168 mg/dL — AB (ref 70–99)
GLUCOSE-CAPILLARY: 121 mg/dL — AB (ref 70–99)
Glucose-Capillary: 175 mg/dL — ABNORMAL HIGH (ref 70–99)
Glucose-Capillary: 290 mg/dL — ABNORMAL HIGH (ref 70–99)

## 2018-05-14 LAB — CBC
HEMATOCRIT: 29.5 % — AB (ref 39.0–52.0)
Hemoglobin: 8.5 g/dL — ABNORMAL LOW (ref 13.0–17.0)
MCH: 22 pg — AB (ref 26.0–34.0)
MCHC: 28.8 g/dL — ABNORMAL LOW (ref 30.0–36.0)
MCV: 76.4 fL — AB (ref 78.0–100.0)
PLATELETS: 163 10*3/uL (ref 150–400)
RBC: 3.86 MIL/uL — ABNORMAL LOW (ref 4.22–5.81)
RDW: 19.4 % — AB (ref 11.5–15.5)
WBC: 5.6 10*3/uL (ref 4.0–10.5)

## 2018-05-14 LAB — BASIC METABOLIC PANEL
Anion gap: 11 (ref 5–15)
BUN: 32 mg/dL — AB (ref 8–23)
CHLORIDE: 76 mmol/L — AB (ref 98–111)
CO2: 36 mmol/L — ABNORMAL HIGH (ref 22–32)
CREATININE: 1.65 mg/dL — AB (ref 0.61–1.24)
Calcium: 8.4 mg/dL — ABNORMAL LOW (ref 8.9–10.3)
GFR calc Af Amer: 50 mL/min — ABNORMAL LOW (ref >60)
GFR calc non Af Amer: 43 mL/min — ABNORMAL LOW (ref >60)
Glucose, Bld: 189 mg/dL — ABNORMAL HIGH (ref 70–99)
POTASSIUM: 2.9 mmol/L — AB (ref 3.5–5.1)
SODIUM: 123 mmol/L — AB (ref 135–145)

## 2018-05-14 LAB — PROTIME-INR
INR: 1.78
Prothrombin Time: 20.6 seconds — ABNORMAL HIGH (ref 11.4–15.2)

## 2018-05-14 LAB — LACTATE DEHYDROGENASE: LDH: 144 U/L (ref 98–192)

## 2018-05-14 MED ORDER — TOLVAPTAN 15 MG PO TABS
15.0000 mg | ORAL_TABLET | Freq: Once | ORAL | Status: AC
  Administered 2018-05-14: 15 mg via ORAL
  Filled 2018-05-14: qty 1

## 2018-05-14 MED ORDER — SPIRONOLACTONE 12.5 MG HALF TABLET
12.5000 mg | ORAL_TABLET | Freq: Once | ORAL | Status: AC
  Administered 2018-05-14: 12.5 mg via ORAL
  Filled 2018-05-14: qty 1

## 2018-05-14 MED ORDER — SPIRONOLACTONE 25 MG PO TABS
25.0000 mg | ORAL_TABLET | Freq: Every day | ORAL | Status: DC
  Administered 2018-05-15: 25 mg via ORAL
  Filled 2018-05-14: qty 1

## 2018-05-14 MED ORDER — POTASSIUM CHLORIDE CRYS ER 20 MEQ PO TBCR
60.0000 meq | EXTENDED_RELEASE_TABLET | ORAL | Status: AC
  Administered 2018-05-14 (×3): 60 meq via ORAL
  Filled 2018-05-14 (×3): qty 3

## 2018-05-14 MED ORDER — WARFARIN SODIUM 5 MG PO TABS
5.0000 mg | ORAL_TABLET | Freq: Once | ORAL | Status: AC
  Administered 2018-05-14: 5 mg via ORAL
  Filled 2018-05-14: qty 1

## 2018-05-14 MED ORDER — POTASSIUM CHLORIDE CRYS ER 20 MEQ PO TBCR
40.0000 meq | EXTENDED_RELEASE_TABLET | ORAL | Status: DC
  Administered 2018-05-14: 40 meq via ORAL
  Filled 2018-05-14: qty 2

## 2018-05-14 NOTE — Plan of Care (Signed)

## 2018-05-14 NOTE — Progress Notes (Signed)
[place] for warfarin Indication: LVAD  Allergies  Allergen Reactions  . Chlorhexidine Gluconate Itching and Rash  . Codeine Nausea And Vomiting and Other (See Comments)    In high doses  . Lipitor [Atorvastatin] Nausea Only and Other (See Comments)    Nausea with high doses, tolerates 20mg  dose (08/22/16)  . Tape Other (See Comments)    Paper tape is ok    Patient Measurements: Height: 5\' 9"  (175.3 cm) Weight: 203 lb (92.1 kg)(Pt. had taken another weight standing instead of bed) IBW/kg (Calculated) : 70.7  Vital Signs: Temp: 97.9 F (36.6 C) (08/11 0742) Temp Source: Oral (08/11 0742) BP: 74/51 (08/11 0742) Pulse Rate: 80 (08/11 0742)  Labs: Recent Labs    05/12/18 0339 05/13/18 0500 05/14/18 0341  HGB 8.7* 8.5* 8.5*  HCT 30.3* 29.4* 29.5*  PLT 176 172 163  LABPROT 22.2* 20.2* 20.6*  INR 1.96 1.74 1.78  CREATININE 1.35* 1.45* 1.65*    Estimated Creatinine Clearance: 52.7 mL/min (A) (by C-G formula based on SCr of 1.65 mg/dL (H)).   Medical History: Past Medical History:  Diagnosis Date  . AICD (automatic cardioverter/defibrillator) present   . Anxiety   . ASCVD (arteriosclerotic cardiovascular disease)   . Chronic systolic CHF (congestive heart failure) ([place])   . COPD (chronic obstructive pulmonary disease) ([place])   . Coronary artery disease   . Depression   . GI bleed   . High cholesterol   . History of blood transfusion   . History of cocaine abuse   . Hypertension   . LVAD (left ventricular assist device) present ([place])   . Presence of permanent cardiac pacemaker   . Shortness of breath dyspnea   . Suicidal ideation   . Tobacco abuse   . Type II diabetes mellitus (HCC)      Assessment: 89 yoM s/p LVAD for ICM presents with SOB. Pharmacy consulted to resumed warfarin, last dose 8/4. INR slightly supratherapeutic on admit at 2.79.  INR almost at goal this morning at 1.78 after receiving a couple days of  reduced doses.  Trending up.  No bleeding issues noted. CBC/LDH stable.   *PTA Warfarin Dose = 5mg  Mon/Wed/Fri, 7.5mg  Tues/Thurs/Sat/Sun  Goal of Therapy:  INR 1.8-2.4 Monitor platelets by anticoagulation protocol: Yes   Plan:  -Warfarin 5mg  PO x1 -Daily PT/INR -Discussed with Dr. Aundra Dubin, since INR trending up will not initiate heparin for now.  Marguerite Olea, [facility] Clinical Pharmacist Phone [phone]  05/14/2018 8:29 AM

## 2018-05-14 NOTE — Progress Notes (Signed)
Patient ID: Ronald Rappaport., male   DOB: 01-08-1956, 62 y.o.   MRN: [id number]   Advanced Heart Failure VAD Team Note  PCP-Cardiologist: Glori Bickers, MD   Subjective:    Remains on lasix drip @ 30 mg/hr + metolazone + diamox. Good diuresis yesterday, I/Os negative.  Doing better with fluid restriction.  Na still low at 123. Weight probably not accurate (bed weight).   Remains on dobutamine 7.5 mcg/kg/min.   Paracentesis 8/5 with 3.6 L off.  He is thinking about hospice, says he is leaning towards comfort care but wants to talk more with family.   CVP 18-19 today.  MAP around 80 currently.   INR 1.78. K 2.9.   LVAD Interrogation HM 3: Speed: 5800 Flow: 5.6 PI: 2.6 Power: 4.5. 4 PI events this morning.   Objective:    Vital Signs:   Temp:  [96.3 F (35.7 C)-98.1 F (36.7 C)] 97.9 F (36.6 C) (08/11 0742) Pulse Rate:  [80-84] 80 (08/11 0742) Resp:  [13-24] 24 (08/11 0742) BP: (74-126)/(51-105) 74/51 (08/11 0742) SpO2:  [95 %-100 %] 100 % (08/11 0358) Weight:  [92.1 kg] 92.1 kg (08/11 0446) Last BM Date: 05/14/18 Mean arterial Pressure 80   Intake/Output:   Intake/Output Summary (Last 24 hours) at 05/14/2018 1035 Last data filed at 05/14/2018 0900 Gross per 24 hour  Intake 1779.94 ml  Output 4025 ml  Net -2245.06 ml     Physical Exam    General: Chronically ill-appearing HEENT: Normal. Neck: Supple, JVP 16 cm. Carotids OK.  Cardiac:  Mechanical heart sounds with LVAD hum present.  Lungs:  CTAB, normal effort.  Abdomen:  NT, ND, no HSM. No bruits or masses. +BS  LVAD exit site: Well-healed and incorporated. Dressing dry and intact. No erythema or drainage. Stabilization device present and accurately applied. Driveline dressing changed daily per sterile technique. Extremities:  Warm and dry. No cyanosis, clubbing, rash, or edema.  Neuro:  Alert & oriented x 3. Cranial nerves grossly intact. Moves all 4 extremities w/o difficulty. Affect pleasant      Telemetry   AV paced 80s, personally reviewed.   EKG    No new tracings.    Labs   Basic Metabolic Panel: Recent Labs  Lab 05/11/18 0422 05/11/18 1428 05/12/18 0339 05/13/18 0500 05/14/18 0341  NA 127* 128* 127* 124* 123*  K 2.9* 2.8* 3.6 2.6* 2.9*  CL 82* 79* 81* 77* 76*  CO2 34* 37* 33* 34* 36*  GLUCOSE 214* 97 151* 180* 189*  BUN 36* 36* 34* 33* 32*  CREATININE 1.41* 1.38* 1.35* 1.45* 1.65*  CALCIUM 8.6* 8.5* 8.5* 8.3* 8.4*    Liver Function Tests: Recent Labs  Lab 05/17/2018 0441  AST 31  ALT 22  ALKPHOS 102  BILITOT 1.1  PROT 7.1  ALBUMIN 3.4*   No results for input(s): LIPASE, AMYLASE in the last 168 hours. Recent Labs  Lab 05/19/2018 0441 05/09/18 0643 05/11/18 1428  AMMONIA 81* 62* 57*    CBC: Recent Labs  Lab 05/09/2018 0441  05/10/18 0447 05/11/18 0422 05/12/18 0339 05/13/18 0500 05/14/18 0341  WBC 8.1   < > 6.3 6.0 7.0 5.6 5.6  NEUTROABS 6.9  --   --   --   --   --   --   HGB 9.0*   < > 8.5* 8.5* 8.7* 8.5* 8.5*  HCT 31.7*   < > 30.0* 30.0* 30.3* 29.4* 29.5*  MCV 79.1   < > 77.9* 78.7 78.1 77.2* 76.4*  PLT 173   < > 147* 166 176 172 163   < > = values in this interval not displayed.    INR: Recent Labs  Lab 05/10/18 0447 05/11/18 0422 05/12/18 0339 05/13/18 0500 05/14/18 0341  INR 2.47 2.17 1.96 1.74 1.78    Other results:     Imaging   No results found.   Medications:     Scheduled Medications: . acetaZOLAMIDE  250 mg Oral BID  . amiodarone  200 mg Oral Daily  . gabapentin  300 mg Oral BID  . insulin aspart  0-20 Units Subcutaneous TID WC  . insulin aspart  5 Units Subcutaneous TID WC  . lactulose  45 g Oral TID  . levothyroxine  50 mcg Oral QAC breakfast  . magnesium oxide  400 mg Oral Daily  . metolazone  5 mg Oral BID  . midodrine  15 mg Oral TID WC  . mupirocin cream   Topical Daily  . pantoprazole  40 mg Oral Daily  . potassium chloride  60 mEq Oral Q4H  . spironolactone  12.5 mg Oral Once  .  [START ON 05/15/2018] spironolactone  25 mg Oral Daily  . sucralfate  1 g Oral TID WC & HS  . tolvaptan  15 mg Oral Once  . warfarin  5 mg Oral ONCE-1800  . Warfarin - Pharmacist Dosing Inpatient   Does not apply q1800    Infusions: . DOBUTamine 7.5 mcg/kg/min (05/14/18 0900)  . furosemide (LASIX) infusion 30 mg/hr (05/14/18 0900)    PRN Medications: acetaminophen, docusate sodium, ondansetron (ZOFRAN) IV, ondansetron, senna-docusate   Patient Profile   Ronald Milleris a 62 y.o.malewith history of chronic systolic HF s/p Medtronic ICD 2014, CAD s/p CABG in 2000, HTN, Hx of cocaine abuse, Tobacco abuse, Depression, PAF, and COPD. HM3 placed 09/09/2016. 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 He remains on dobutamine 7.71mcg.   Admitted 8/5 with A/C systolic heart failure   Assessment/Plan:    1. 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 Continue Dobutamine 7.5 mcg chronically for RV failure. He continues to deteriorate with severe volume overload and low MAPs.  Volume status improving slowly with with paracentesis, lasix drip @ 30 mg/hr, metolazone 5 mg BID and diamox 250 mg BID.  Better diuresis yesterday and limiting fluid better, but CVP still 18-19. K low.  - Increase spironolactone to 25 mg daily and increase K repletion.  - No BB with RV failure - MAP better this morning on higher midodrine 15 mg tid.  - Continue current diuretic regimen.  - Will add tolvaptan dose with persistently low sodium.  - Continue dobutamine 10.  - Continue coumadin. INR goal 2-2.5. INR 1.78, hold off on heparin gtt today and continue dosing up warfarin.  -Overall continues to deteriorate despite VAD support and high-dose inotropes for RV support. He is near or at end-stage and this is complicated by severe noncompliance despite residing in SNF. We discussed hospice/comfort care  again today.  He wants to discuss more with his family but think he is leaning in that direction.  2. HMIII: VAD interrogated personally. Parameters stable.  LDH stable 144.   3. Hyponatremia: Received tolvaptan x1 8/6. Na lower at 123. - He is doing better with free water limitation.  - Will dose with tolvaptan again today.  4. Anemia: Received 2UPRBCs on 8/1 for hgb 7.7. Hemoglobin 9.0 >  8.5 > 8.5 > 8.5 > 8.5. Denies bleeding.  5. Hyperkalemia/hypokalemia: Increased standing K and spironolactone today.  6. PAF: Remains in NSR.  - On coumadin.  7. Cirrhosis with hepatic encephalopathy: Lactulose increased 8/6. Repeat paracentesis 05/11/18 with 2 L of fluid off. He seems clear this morning.  8. RLE partial thickness wound: WOC following. Recommending roam dressing and bactroban. No change.   I reviewed the LVAD parameters from today, and compared the results to the patient's prior recorded data.  No programming changes were made.  The LVAD is functioning within specified parameters.  The patient performs LVAD self-test daily.  LVAD interrogation was negative for any significant power changes, alarms or PI events/speed drops.  LVAD equipment check completed and is in good working order.  Back-up equipment present. LVAD education done on emergency procedures and precautions and reviewed exit site care.   Length of Stay: 6  Loralie Champagne, MD 05/14/2018, 10:35 AM  VAD Team --- VAD ISSUES ONLY--- Pager [phone] (7am - 7am)  Advanced Heart Failure Team  Pager [phone] (M-F; 7a - 4p)  Please contact [facility] for night-coverage after hours (4p -7a ) and weekends on [email or web address]

## 2018-05-15 LAB — GLUCOSE, CAPILLARY
GLUCOSE-CAPILLARY: 150 mg/dL — AB (ref 70–99)
GLUCOSE-CAPILLARY: 147 mg/dL — AB (ref 70–99)
Glucose-Capillary: 185 mg/dL — ABNORMAL HIGH (ref 70–99)
Glucose-Capillary: 242 mg/dL — ABNORMAL HIGH (ref 70–99)

## 2018-05-15 LAB — CBC
HCT: 29.4 % — ABNORMAL LOW (ref 39.0–52.0)
Hemoglobin: 8.4 g/dL — ABNORMAL LOW (ref 13.0–17.0)
MCH: 22 pg — ABNORMAL LOW (ref 26.0–34.0)
MCHC: 28.6 g/dL — ABNORMAL LOW (ref 30.0–36.0)
MCV: 77 fL — AB (ref 78.0–100.0)
PLATELETS: 181 10*3/uL (ref 150–400)
RBC: 3.82 MIL/uL — ABNORMAL LOW (ref 4.22–5.81)
RDW: 19.5 % — AB (ref 11.5–15.5)
WBC: 6 10*3/uL (ref 4.0–10.5)

## 2018-05-15 LAB — LACTATE DEHYDROGENASE: LDH: 134 U/L (ref 98–192)

## 2018-05-15 LAB — BASIC METABOLIC PANEL
Anion gap: 11 (ref 5–15)
BUN: 37 mg/dL — AB (ref 8–23)
CHLORIDE: 78 mmol/L — AB (ref 98–111)
CO2: 35 mmol/L — ABNORMAL HIGH (ref 22–32)
CREATININE: 1.63 mg/dL — AB (ref 0.61–1.24)
Calcium: 8.7 mg/dL — ABNORMAL LOW (ref 8.9–10.3)
GFR calc Af Amer: 51 mL/min — ABNORMAL LOW (ref >60)
GFR calc non Af Amer: 44 mL/min — ABNORMAL LOW (ref >60)
GLUCOSE: 202 mg/dL — AB (ref 70–99)
Potassium: 4.3 mmol/L (ref 3.5–5.1)
Sodium: 124 mmol/L — ABNORMAL LOW (ref 135–145)

## 2018-05-15 LAB — PROTIME-INR
INR: 1.85
Prothrombin Time: 21.1 seconds — ABNORMAL HIGH (ref 11.4–15.2)

## 2018-05-15 MED ORDER — WARFARIN SODIUM 5 MG PO TABS
5.0000 mg | ORAL_TABLET | Freq: Once | ORAL | Status: AC
  Administered 2018-05-15: 5 mg via ORAL
  Filled 2018-05-15: qty 1

## 2018-05-15 NOTE — Progress Notes (Signed)
[facility] for warfarin Indication: LVAD  Allergies  Allergen Reactions  . Chlorhexidine Gluconate Itching and Rash  . Codeine Nausea And Vomiting and Other (See Comments)    In high doses  . Lipitor [Atorvastatin] Nausea Only and Other (See Comments)    Nausea with high doses, tolerates 20mg  dose (08/22/16)  . Tape Other (See Comments)    Paper tape is ok    Patient Measurements: Height: 5\' 9"  (175.3 cm) Weight: 199 lb 1.6 oz (90.3 kg) IBW/kg (Calculated) : 70.7  Vital Signs: Temp: 97.9 F (36.6 C) (08/12 0736) Temp Source: Oral (08/12 0736) BP: 99/80 (08/12 0736) Pulse Rate: 80 (08/12 0333)  Labs: Recent Labs    05/13/18 0500 05/14/18 0341 05/15/18 0602  HGB 8.5* 8.5* 8.4*  HCT 29.4* 29.5* 29.4*  PLT 172 163 181  LABPROT 20.2* 20.6* 21.1*  INR 1.74 1.78 1.85  CREATININE 1.45* 1.65* 1.63*    Estimated Creatinine Clearance: 52.8 mL/min (A) (by C-G formula based on SCr of 1.63 mg/dL (H)).   Medical History: Past Medical History:  Diagnosis Date  . AICD (automatic cardioverter/defibrillator) present   . Anxiety   . ASCVD (arteriosclerotic cardiovascular disease)   . Chronic systolic CHF (congestive heart failure) ([place])   . COPD (chronic obstructive pulmonary disease) ([place])   . Coronary artery disease   . Depression   . GI bleed   . High cholesterol   . History of blood transfusion   . History of cocaine abuse   . Hypertension   . LVAD (left ventricular assist device) present ([place])   . Presence of permanent cardiac pacemaker   . Shortness of breath dyspnea   . Suicidal ideation   . Tobacco abuse   . Type II diabetes mellitus (HCC)      Assessment: 64 yoM s/p LVAD for ICM presents with SOB. Pharmacy consulted to resumed warfarin. INR therapeutic at 1.85 after being low over the weekend, Hgb and LDH stable.  *PTA Warfarin Dose = 5mg  Mon/Wed/Fri, 7.5mg  Tues/Thurs/Sat/Sun  Goal of Therapy:  INR 1.8-2.4 Monitor  platelets by anticoagulation protocol: Yes   Plan:  -Warfarin 5mg  PO x1 -Daily PT/INR  Arrie Senate, PharmD, BCPS Clinical Pharmacist [phone] Please check AMION for all [place] numbers 05/15/2018

## 2018-05-15 NOTE — Clinical Social Work Note (Signed)
CSW continues to follow for discharge needs.  Elmus Mathes, CSW [phone]  

## 2018-05-15 NOTE — Plan of Care (Signed)

## 2018-05-15 NOTE — Progress Notes (Signed)
LVAD Coordinator Rounding Note:  Admitted 05/30/2018 to Dr. Haroldine Laws due to ascites and CHF with fluid overload.   HM III LVAD implanted on 09/09/16 by Dr. Cyndia Bent under Destination Therapy criteria.  Pt lying in bed, tearful discussing end of life decisions with me this morning. Pt states that he feels like he is giving up.  Vital signs: Temp:  97.9 HR: 80 Doppler Pressure:  68 Automatic BP:  99/80(86) O2 Sat: 92 % on RA Wt:  209>215>212>210>203lb>90.3kg  LVAD interrogation reveals:  Speed: 5800 Flow: 5.7 Power: 4.5 w PI:  2.3 Alarms: none  Events: 6 PI events Hct: 29 Fixed speed:  5800  Low speed limit: 5500  Drive Line: left abdominal sorbaview dressing dry and intact; anchor intact and accurately applied.  Pt has an intolerance to CHG please use saline and betadine only.  Next dressing change due 05/16/18. Bedside nurse may change dressing.   Labs:  LDH trend: 185>157>158>137>134  INR trend: 2.79>2.70>2.47>2.17>1.96>1.85  Gtts: Dobutamine 7.5 mcg/kg/min Lasix 30 mg/hr  Anticoagulation Plan: -INR Goal:  1.8 - 2.3 -ASA Dose: no ASA (history of GI bleeding)  Device: -Protect; end of service 08/2016 -Therapies:  Off Last check: complete interrogation 05/26/2017. DDD (lower rate 60); BiV pacing with 96% AS-VP  Adverse Events on VAD: 11/30/16> Milrinone gtt at discharge 01/27/17>dobutamine at 5 mcg/kg/mingtt at discharge 03/08/17> RV failure 04/29/17> symptomatic anemia and melena. EGD revealed stomach ulcers. Carafate added; ASA stopped 05/31/17>Ongoing symptomatic anemia and melena; capsule study mild antral gastritis; poss tiny AVM right colon. Lower MAPS, Losartan cut back. 06/2017> admit with syncope, AMS, Dobutamine at 7.59mcg 11/09/17>Marked ascites, RV failure, and hepatic encephalopathy. Paracentesis on 2/7 and 2/12 with >11 liters fluid removed 12/27/17>Outpatient paracentesis with 2.8 liters removed 01/11/18>IP paracentesis with 4.8 liters removed 01/16/18>IP  paracentesis w/2.4L removed 02/15/18>OP paracentesis w/ 5 L removed 02/23/18>IP paracentesis w/ 5.6 L removed   Plan/Recommendations:  1. Will need weekly dressing changes; due 05/16/18. Bedside RN may change dressing w/above instructions.  2. Please call VAD pager with any VAD equipment or drive  Tanda Rockers RN VAD Coordiantor 24/7 VAD pager: [phone]

## 2018-05-15 NOTE — Progress Notes (Addendum)
Patient ID: Ronald Beezley., male   DOB: May 30, 1956, 62 y.o.   MRN: [id number]   Advanced Heart Failure VAD Team Note  PCP-Cardiologist: Ronald Bickers, MD   Subjective:    Remains on lasix drip @ 30 mg/hr + metolazone + diamox. Na still low at 124.   Negative 1.5 L. Weight down around 4 lbs.   Remains on dobutamine 7.5 mcg/kg/min.   Paracentesis 8/5 with 3.6 L off.  Asking about going home vs hospice. Breathing is OK. Tremulous this am. We discussed returning to [facility] for "one more try", and then Hospice if he requires additional admission. Wants [place] to discuss with family but says he doesn't want to go back to [facility]. Seems confused and agitated at times.   CVP 13-14.  MAP around 80 currently. No dyspnea or orthopnea.   INR 1.85. K 4.3.   LVAD Interrogation HM 3: Speed: 5800 Flow: 5.5 PI: 2.9 Power: 5.0. Occ PI events   Objective:    Vital Signs:   Temp:  [97.5 F (36.4 C)-97.9 F (36.6 C)] 97.9 F (36.6 C) (08/12 0736) Pulse Rate:  [80-94] 80 (08/12 0333) Resp:  [15-22] 18 (08/12 0736) BP: (61-99)/(48-80) 99/80 (08/12 0736) Weight:  [90.3 kg] 90.3 kg (08/12 0531) Last BM Date: 05/14/18 Mean arterial Pressure 70s  Intake/Output:   Intake/Output Summary (Last 24 hours) at 05/15/2018 0749 Last data filed at 05/15/2018 0600 Gross per 24 hour  Intake 2195.81 ml  Output 3375 ml  Net -1179.19 ml     Physical Exam    General: Chronically ill- appearing.   HEENT: Normal. Anicteric  Neck: Supple, JVP 14-16 cm. Carotids OK.  Cardiac:  Mechanical heart sounds with LVAD hum present.  Lungs:  CTAB, normal effort.  No wheeze Abdomen:  Obese NT, ND, no HSM. No bruits or masses. +BS  LVAD exit site: Well-healed and incorporated. Dressing dry and intact. No erythema or drainage. Stabilization device present and accurately applied. Driveline dressing changed daily per sterile technique. Extremities:  Warm and dry. No cyanosis, clubbing, rash, trace edema.    Neuro:  Alert & oriented x 3 but seems forgetful. No asterixis. Cranial nerves grossly intact. Moves all 4 extremities w/o difficulty. Affect pleasant     Telemetry   AV paced 80s, personally reviewed.   EKG    No new tracings.    Labs   Basic Metabolic Panel: Recent Labs  Lab 05/11/18 1428 05/12/18 0339 05/13/18 0500 05/14/18 0341 05/15/18 0602  NA 128* 127* 124* 123* 124*  K 2.8* 3.6 2.6* 2.9* 4.3  CL 79* 81* 77* 76* 78*  CO2 37* 33* 34* 36* 35*  GLUCOSE 97 151* 180* 189* 202*  BUN 36* 34* 33* 32* 37*  CREATININE 1.38* 1.35* 1.45* 1.65* 1.63*  CALCIUM 8.5* 8.5* 8.3* 8.4* 8.7*    Liver Function Tests: No results for input(s): AST, ALT, ALKPHOS, BILITOT, PROT, ALBUMIN in the last 168 hours. No results for input(s): LIPASE, AMYLASE in the last 168 hours. Recent Labs  Lab 05/09/18 0643 05/11/18 1428  AMMONIA 62* 57*    CBC: Recent Labs  Lab 05/11/18 0422 05/12/18 0339 05/13/18 0500 05/14/18 0341 05/15/18 0602  WBC 6.0 7.0 5.6 5.6 6.0  HGB 8.5* 8.7* 8.5* 8.5* 8.4*  HCT 30.0* 30.3* 29.4* 29.5* 29.4*  MCV 78.7 78.1 77.2* 76.4* 77.0*  PLT 166 176 172 163 181    INR: Recent Labs  Lab 05/11/18 0422 05/12/18 0339 05/13/18 0500 05/14/18 0341 05/15/18 0602  INR 2.17 1.96  1.74 1.78 1.85    Other results:     Imaging   No results found.   Medications:     Scheduled Medications: . acetaZOLAMIDE  250 mg Oral BID  . amiodarone  200 mg Oral Daily  . gabapentin  300 mg Oral BID  . insulin aspart  0-20 Units Subcutaneous TID WC  . insulin aspart  5 Units Subcutaneous TID WC  . lactulose  45 g Oral TID  . levothyroxine  50 mcg Oral QAC breakfast  . magnesium oxide  400 mg Oral Daily  . metolazone  5 mg Oral BID  . midodrine  15 mg Oral TID WC  . mupirocin cream   Topical Daily  . pantoprazole  40 mg Oral Daily  . spironolactone  25 mg Oral Daily  . sucralfate  1 g Oral TID WC & HS  . Warfarin - Pharmacist Dosing Inpatient   Does not apply  q1800    Infusions: . DOBUTamine 7.5 mcg/kg/min (05/15/18 0513)  . furosemide (LASIX) infusion 30 mg/hr (05/15/18 0033)    PRN Medications: acetaminophen, docusate sodium, ondansetron (ZOFRAN) IV, ondansetron, senna-docusate   Patient Profile   Ronald Milleris a 62 y.o.malewith history of chronic systolic HF s/p Medtronic ICD 2014, CAD s/p CABG in 2000, HTN, Hx of cocaine abuse, Tobacco abuse, Depression, PAF, and COPD. HM3 placed 09/09/2016. 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 He remains on dobutamine 7.26mcg.   Admitted 8/5 with A/C systolic heart failure   Assessment/Plan:    1. 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 Continue Dobutamine 7.5 mcg chronically for RV failure. He continues to deteriorate with severe volume overload and low MAPs.   - Volume status has made very slow progress with with paracentesis, lasix drip @ 30 mg/hr, metolazone 5 mg BID and diamox 250 mg BID.  - CVP ~14 cm - Continue spironolactone 25 mg daily with K repletion.  - No BB with RV failure - MAP improved on higher midodrine 15 mg tid.  - Continue current diuretic regimen.  - Consider repeat tolvaptan dose with persistently low sodium.  - Continue dobutamine 10.  - Continue coumadin. INR goal 2-2.5. INR 1.85 today. Holding off on heparin.  - Overall continues to deteriorate despite VAD support and high-dose inotropes for RV support. He is near or at end-stage and this is complicated by severe noncompliance despite residing in SNF. We discussed hospice/comfort care again today.  He wants to discuss more with his family but think he is leaning in that direction.  2. HMIII:  - VAD interrogated personally. Parameters stable.   - LDH 134.   3. Hyponatremia:  - Received tolvaptan x1 8/6 and 8/11. Na 124 - He is doing better with free water limitation.  4. Anemia: Received  2UPRBCs on 8/1 for hgb 7.7.  - Hgb stable at 8.4 this am with no overt bleeding.  5. Hyperkalemia/hypokalemia:  - K stable today.  6. PAF: - Remains in NSR.  - On coumadin.  7. Cirrhosis with hepatic encephalopathy:  - Lactulose increased 8/6. Repeat paracentesis 05/11/18 with 2 L of fluid off. - Intermittent.  8. RLE partial thickness wound:  - WOC following. Recommending roam dressing and bactroban. No change.   He has requested we discuss disposition with his Sister.   I reviewed the LVAD parameters from today, and compared the results to the patient's prior recorded  data.  No programming changes were made.  The LVAD is functioning within specified parameters.  The patient performs LVAD self-test daily.  LVAD interrogation was negative for any significant power changes, alarms or PI events/speed drops.  LVAD equipment check completed and is in good working order.  Back-up equipment present.   LVAD education done on emergency procedures and precautions and reviewed exit site care.   Length of Stay: [address]  Annamaria Helling 05/15/2018, 7:49 AM  VAD Team --- VAD ISSUES ONLY--- Pager [phone] (7am - 7am)  Advanced Heart Failure Team  Pager [phone] (M-F; 7a - 4p)  Please contact [facility] for night-coverage after hours (4p -7a ) and weekends on [email or web address]   Patient seen and examined with the above-signed Advanced Practice Provider and/or Housestaff. I personally reviewed laboratory data, imaging studies and relevant notes. I independently examined the patient and formulated the important aspects of the plan. I have edited the note to reflect any of my changes or salient points. I have personally discussed the plan with the patient and/or family.  Remains very tenuous despite VAD support and high-dose dobutamine. Midodrine increased to 15 tid and MAPs improved. Volume status is better but he continues to fluid from everyone who walks into his room.   We have had numerous  talks with him and his family about disposition including Hospice. At this point, I think the best plan may be return to [facility] with transition to full Hospice Care (no dobutamine, deactivate VAD) if he is unable to be cared for as an outpatient. I have left messages with his sister to discuss this am.   VAD interrogated personally. Parameters stable.  Ronald Bickers, MD  9:35 AM

## 2018-05-16 DIAGNOSIS — Z7189 Other specified counseling: Secondary | ICD-10-CM

## 2018-05-16 DIAGNOSIS — K7210 Chronic hepatic failure without coma: Secondary | ICD-10-CM

## 2018-05-16 DIAGNOSIS — Z515 Encounter for palliative care: Secondary | ICD-10-CM

## 2018-05-16 DIAGNOSIS — I5021 Acute systolic (congestive) heart failure: Secondary | ICD-10-CM

## 2018-05-16 LAB — GLUCOSE, CAPILLARY: GLUCOSE-CAPILLARY: 191 mg/dL — AB (ref 70–99)

## 2018-05-16 LAB — BASIC METABOLIC PANEL
ANION GAP: 13 (ref 5–15)
Anion gap: 7 (ref 5–15)
BUN: 25 mg/dL — ABNORMAL HIGH (ref 8–23)
BUN: 41 mg/dL — AB (ref 8–23)
CHLORIDE: 101 mmol/L (ref 98–111)
CO2: 21 mmol/L — AB (ref 22–32)
CO2: 36 mmol/L — ABNORMAL HIGH (ref 22–32)
Calcium: 5.2 mg/dL — CL (ref 8.9–10.3)
Calcium: 8.7 mg/dL — ABNORMAL LOW (ref 8.9–10.3)
Chloride: 77 mmol/L — ABNORMAL LOW (ref 98–111)
Creatinine, Ser: 1.16 mg/dL (ref 0.61–1.24)
Creatinine, Ser: 1.89 mg/dL — ABNORMAL HIGH (ref 0.61–1.24)
GFR calc Af Amer: 43 mL/min — ABNORMAL LOW (ref >60)
GFR calc non Af Amer: >60 mL/min (ref >60)
GFR calc non Af Amer: 37 mL/min — ABNORMAL LOW (ref >60)
GLUCOSE: 205 mg/dL — AB (ref 70–99)
Glucose, Bld: 392 mg/dL — ABNORMAL HIGH (ref 70–99)
POTASSIUM: 2.8 mmol/L — AB (ref 3.5–5.1)
Potassium: <2 mmol/L — CL (ref 3.5–5.1)
SODIUM: 126 mmol/L — AB (ref 135–145)
Sodium: 129 mmol/L — ABNORMAL LOW (ref 135–145)

## 2018-05-16 LAB — CBC
HCT: 28 % — ABNORMAL LOW (ref 39.0–52.0)
Hemoglobin: 7.9 g/dL — ABNORMAL LOW (ref 13.0–17.0)
MCH: 21.9 pg — AB (ref 26.0–34.0)
MCHC: 28.2 g/dL — ABNORMAL LOW (ref 30.0–36.0)
MCV: 77.6 fL — ABNORMAL LOW (ref 78.0–100.0)
Platelets: 190 10*3/uL (ref 150–400)
RBC: 3.61 MIL/uL — AB (ref 4.22–5.81)
RDW: 19.2 % — ABNORMAL HIGH (ref 11.5–15.5)
WBC: 5.9 10*3/uL (ref 4.0–10.5)

## 2018-05-16 LAB — PROTIME-INR
INR: 2.86
INR: 1.68
PROTHROMBIN TIME: 19.6 s — AB (ref 11.4–15.2)
Prothrombin Time: 29.7 seconds — ABNORMAL HIGH (ref 11.4–15.2)

## 2018-05-16 LAB — LACTATE DEHYDROGENASE: LDH: 62 U/L — ABNORMAL LOW (ref 98–192)

## 2018-05-16 MED ORDER — TRAZODONE HCL 50 MG PO TABS: 50.0000 mg | ORAL_TABLET | Freq: Every evening | ORAL | Status: DC | PRN

## 2018-05-16 MED ORDER — MORPHINE SULFATE (PF) 2 MG/ML IV SOLN
2.0000 mg | INTRAVENOUS | Status: DC | PRN
  Administered 2018-05-17 (×4): 2 mg via INTRAVENOUS
  Filled 2018-05-16 (×4): qty 1

## 2018-05-16 MED ORDER — LORAZEPAM 2 MG/ML IJ SOLN: 0.5000 mg | Freq: Four times a day (QID) | INTRAMUSCULAR | Status: DC | PRN

## 2018-05-16 MED ORDER — POTASSIUM CHLORIDE CRYS ER 20 MEQ PO TBCR: 40.0000 meq | EXTENDED_RELEASE_TABLET | Freq: Three times a day (TID) | ORAL | Status: DC

## 2018-05-16 MED ORDER — LORAZEPAM 2 MG/ML IJ SOLN
1.0000 mg | INTRAMUSCULAR | Status: DC | PRN
  Administered 2018-05-17: 1 mg via INTRAVENOUS
  Filled 2018-05-16: qty 1

## 2018-05-16 MED ORDER — GLYCOPYRROLATE 1 MG PO TABS
1.0000 mg | ORAL_TABLET | ORAL | Status: DC | PRN
  Filled 2018-05-16: qty 1

## 2018-05-16 MED ORDER — GLYCOPYRROLATE 0.2 MG/ML IJ SOLN: 0.2000 mg | INTRAMUSCULAR | Status: DC | PRN

## 2018-05-16 MED ORDER — GLYCOPYRROLATE 0.2 MG/ML IJ SOLN
0.2000 mg | INTRAMUSCULAR | Status: DC | PRN
  Administered 2018-05-18: 0.2 mg via INTRAVENOUS
  Filled 2018-05-16: qty 1

## 2018-05-16 MED ORDER — MORPHINE SULFATE (PF) 2 MG/ML IV SOLN: 2.0000 mg | INTRAVENOUS | Status: DC | PRN

## 2018-05-16 MED ORDER — ACETAMINOPHEN 325 MG PO TABS: 650.0000 mg | ORAL_TABLET | Freq: Four times a day (QID) | ORAL | Status: DC | PRN

## 2018-05-16 NOTE — Progress Notes (Signed)
Discontinued monitoring as per  Palliative team, CMT made aware.

## 2018-05-16 NOTE — Progress Notes (Signed)
Inpatient Diabetes Program Recommendations  AACE/ADA: New Consensus Statement on Inpatient Glycemic Control (2019)  Target Ranges:  Prepandial:   less than 140 mg/dL      Peak postprandial:   less than 180 mg/dL (1-2 hours)      Critically ill patients:  140 - 180 mg/dL  Results for Ronald Miller, Ronald Miller (MRN [id number]) as of 05/16/2018 07:37  Ref. Range 05/16/2018 04:22 05/16/2018 06:00  Glucose Latest Ref Range: 70 - 99 mg/dL 392 (H) 205 (H)   Results for Ronald Miller, Ronald Miller (MRN [id number]) as of 05/16/2018 07:37  Ref. Range 05/15/2018 07:39 05/15/2018 12:18 05/15/2018 17:16 05/15/2018 21:13  Glucose-Capillary Latest Ref Range: 70 - 99 mg/dL 185 (H) 242 (H) 150 (H) 147 (H)    Review of Glycemic Control  Current orders for Inpatient glycemic control: Novolog 0-20 units TID with meals, Novolog 5 units TID with meals for meal coverage  Inpatient Diabetes Program Recommendations:  Correction (SSI): Please consider ordering Novolog 0-5 units QHS for bedtime correction. Insulin - Meal Coverage: Please consider increasing meal coverage to Novolog 7 units TID with meals.  Thanks, Barnie Alderman, RN, MSN, CDE Diabetes Coordinator Inpatient Diabetes Program [phone] (Team Pager from 8am to 5pm)

## 2018-05-16 NOTE — Progress Notes (Signed)
Medtronic rep at bedside; confirms ICD is turned off.   [place]  Claire Dolores, RN

## 2018-05-16 NOTE — Progress Notes (Signed)
Brief note, full note to follow-  Patient sitting at bedside. No complaints. States he has made decision for Hospice. Would like me to call his sister ASAP before further discussion. He asks, "What's going to happen to me now?". He feels spiritually ready to die, he is curious about physical aspects of end of life and technical aspects of location. However, prefers to discuss further when his sister is present.  I called Iantha Fallen and she will arrive around 1130.  PRN orders for morphine and lorazepam entered should patient become symptomatic in the mean time.   Thank you kindly for this consult.  Mariana Kaufman, AGNP-C Palliative Medicine  Please call Palliative Medicine team phone with any questions [phone]. For individual providers please see AMION.

## 2018-05-16 NOTE — Progress Notes (Signed)
Pt. is asymptomatic the whole shift.

## 2018-05-16 NOTE — Progress Notes (Signed)
LVAD Coordinator Rounding Note:  Admitted 05/17/2018 to Dr. Haroldine Laws due to ascites and CHF with fluid overload.   HM III LVAD implanted on 09/09/16 by Dr. Cyndia Bent under Destination Therapy criteria.  Pt sitting on the side of bed, pt is again tearful this morning as we discuss end of life. pts sister Karna Christmas called this morning and states that the pt and family all agree to proceed with Hospice/Palliative care. [name] confirms this decision this morning. We will consult palliative care and make the pt full comfort care.  Vital signs: Temp:  97.6 HR: 82 Doppler Pressure:  73 Automatic BP:  79/66 O2 Sat: 98 % on RA Wt:  209>215>212>210>203lb>90.3>90.4kg  LVAD interrogation reveals:  Speed: 5800 Flow: 5.5 Power: 4.5 w PI:  2.4 Alarms: none  Events: 20-30 PI events daily Hct: 29 Fixed speed:  5800  Low speed limit: 5500  Drive Line: left abdominal sorbaview dressing dry and intact; anchor intact and accurately applied.  Pt has an intolerance to CHG please use saline and betadine only.  Next dressing change due 05/16/18. Bedside nurse may change dressing.   Labs:  LDH trend: 185>157>158>137>134>62  INR trend: 2.79>2.70>2.47>2.17>1.96>1.85>2.86  Gtts: Dobutamine 7.5 mcg/kg/min-will stop today Lasix 30 mg/hr-will stop today  Anticoagulation Plan: -INR Goal:  1.8 - 2.3 -ASA Dose: no ASA (history of GI bleeding)  Device: -Protect; end of service 08/2016 -Therapies:  Off Last check: complete interrogation 05/26/2017. DDD (lower rate 60); BiV pacing with 96% AS-VP  Adverse Events on VAD: 11/30/16> Milrinone gtt at discharge 01/27/17>dobutamine at 5 mcg/kg/mingtt at discharge 03/08/17> RV failure 04/29/17> symptomatic anemia and melena. EGD revealed stomach ulcers. Carafate added; ASA stopped 05/31/17>Ongoing symptomatic anemia and melena; capsule study mild antral gastritis; poss tiny AVM right colon. Lower MAPS, Losartan cut back. 06/2017> admit with syncope, AMS, Dobutamine at  7.52mcg 11/09/17>Marked ascites, RV failure, and hepatic encephalopathy. Paracentesis on 2/7 and 2/12 with >11 liters fluid removed 12/27/17>Outpatient paracentesis with 2.8 liters removed 01/11/18>IP paracentesis with 4.8 liters removed 01/16/18>IP paracentesis w/2.4L removed 02/15/18>OP paracentesis w/ 5 L removed 02/23/18>IP paracentesis w/ 5.6 L removed   Plan/Recommendations:  1. Pt is full comfort care, please allow pt to have any food or drink that he request.  2. Please call VAD pager with any VAD equipment or drive line issues.   Tanda Rockers RN VAD Coordiantor 24/7 VAD pager: [phone]

## 2018-05-16 NOTE — Progress Notes (Signed)
CSW met with patient at bedside as he is well known to this CSW from VAD clinic. CSW discussed comfort care decision and offered supportive intervention to patient around goals of care. Patient appears to be at peace with his decision and verbalizes understanding of comfort care. Patient requested a peach or a banana while CSW was at bedside. CSW assured patient that comfort care meant he could have whatever he wanted. Patient grateful for the support and appreciative of visit. CSW also spoke with family (sister and mother) at bedside who also verbalize understanding of comfort care decision. CSW will continue to be available as needed to support patient and family at bedside during this time. Jackie , LCSW, CCSW-MCS [phone]  

## 2018-05-16 NOTE — Progress Notes (Signed)
CRITICAL VALUE ALERT  Critical Value: Potassium < 2.0 , Calcium 5.2  Date & Time Notied:  05/16/18 at 0545  Provider Notified: [facility] fellow  Orders Received/Actions taken: Pending

## 2018-05-16 NOTE — Consult Note (Addendum)
Consultation Note Date: 05/16/2018   Patient Name: Ronald Miller.  DOB: 28-Nov-1955  MRN: [id number]  Age / Sex: 62 y.o., male  PCP: Leone Haven, MD Referring Physician: Jolaine Artist, MD  Reason for Consultation: Establishing goals of care  HPI/Patient Profile: 61 y.o. male  with past medical history of CABG, CHF s/p VAD placement 09/2016 on milrinone then dobutamine drips since 01/2018, hepatic cirrhosis with encephalopathy and ascites s/p multiple paracentesis admitted from SNF on 05/26/2018 with CHF exacerbation, hepatic encephalopathy, abdominal ascites (s/p 1.8L off on 8/8). During admission he has continued to decline. GOC discussion was had with his attending team and he decided to transition care to comfort. Attending team has stopped life prolonging interventions except for LVAD. Palliative medicine consulted for additional support.    Clinical Assessment and Goals of Care: Met with patient, his daughter Sharyn Lull, mother Holley Raring, and sister Karna Christmas. VAD RN coordinator Judson Roch also present. Patient was oriented to situation, appropriate affect, thought and behavior.  Supported patient as he discussed with them his decision to transition his care to full comfort which includes discontinuing VAD device.  Answered patient's and family's questions regarding comfort care and the transition.  We addressed code status and patient and family agreed that DNR status best aligns with his [place]. Patient's main goals of care are to not be aware when VAD is stopped. He does not want to suffer as he experiences end of life. He wishes to spend his time eating fruit and spending time with his family. His family expressed desire to be present when the VAD was disconnected.  He is asymptomatic for now.   Primary Decision Maker PATIENT - has sister- Terri who he states is his HCPOA   SUMMARY OF  RECOMMENDATIONS -DNR -Comfort care orders entered-  -Morphine 576m q133m prn SOB  -Lorazepam 76m54m4hrprn SOB or anxiety  -Regular diet  -Robinul .2mg376m q4hr secretions  -Trazodone 50mg13mqhs prn sleep  -Please contact PMT for uncontrolled symptoms between 7am and 7pm. Outside those hours, please contact attending team.  -Discussed with Andy Oda Kilts as patient becomes more symptomatic and requires more PRNs then attending team will start IV morphine/versed and stop LVAD per their protocol- likely tomorrow -Anticipate hospital death    Code Status/Advance Care Planning:  DNR  Palliative Prophylaxis:   Frequent Pain Assessment  Additional Recommendations (Limitations, Scope, Preferences):  Full Comfort Care  Prognosis:    < 2 weeks- likely hours to days once VAD is disconnected  Discharge Planning: Anticipated Hospital Death  Primary Diagnoses: Present on Admission: . Acute on chronic systolic heart failure (HCC) [place] have reviewed the medical record, interviewed the patient and family, and examined the patient. The following aspects are pertinent.  Past Medical History:  Diagnosis Date  . AICD (automatic cardioverter/defibrillator) present   . Anxiety   . ASCVD (arteriosclerotic cardiovascular disease)   . Chronic systolic CHF (congestive heart failure) (HCC) [place] COPD (chronic obstructive pulmonary disease) (HCC) [place]  Coronary artery disease   . Depression   . GI bleed   . High cholesterol   . History of blood transfusion   . History of cocaine abuse   . Hypertension   . LVAD (left ventricular assist device) present ([place])   . Presence of permanent cardiac pacemaker   . Shortness of breath dyspnea   . Suicidal ideation   . Tobacco abuse   . Type II diabetes mellitus ([place])    Social History   Socioeconomic History  . Marital status: Divorced    Spouse name: Not on file  . Number of children: 1  . Years of education: 23  . Highest education level: Not  on file  Occupational History  . Occupation: Retired  . Occupation: [employer] operational officer  . Financial resource strain: Not on file  . Food insecurity:    Worry: Not on file    Inability: Not on file  . Transportation needs:    Medical: Not on file    Non-medical: Not on file  Tobacco Use  . Smoking status: Former Smoker    Packs/day: 1.50    Years: 23.00    Pack years: 34.50    Types: Cigarettes    Start date: 10/24/1992    Last attempt to quit: 07/01/2016    Years since quitting: 1.8  . Smokeless tobacco: Never Used  Substance and Sexual Activity  . Alcohol use: No    Alcohol/week: 0.0 standard drinks  . Drug use: Yes    Frequency: 0.1 times per week    Types: Cocaine    Comment: 07/01/2016 last use relapse after 8 yrs  . Sexual activity: Not Currently  Lifestyle  . Physical activity:    Days per week: Not on file    Minutes per session: Not on file  . Stress: Not on file  Relationships  . Social connections:    Talks on phone: Not on file    Gets together: Not on file    Attends religious service: Not on file    Active member of club or organization: Not on file    Attends meetings of clubs or organizations: Not on file    Relationship status: Not on file  Other Topics Concern  . Not on file  Social History Narrative   Divorced with one child   No regular exercise   Family History  Problem Relation Age of Onset  . Arthritis Mother   . Hyperlipidemia Mother   . CAD Father   . Diabetes Father   . Heart failure Father   . Alcohol abuse Father   . Hyperlipidemia Father   . Hyperlipidemia Maternal Grandmother   . Hyperlipidemia Maternal Grandfather   . Hyperlipidemia Paternal Grandmother   . Hyperlipidemia Paternal Grandfather    Scheduled Meds: Continuous Infusions: PRN Meds:.acetaminophen, glycopyrrolate **OR** glycopyrrolate **OR** glycopyrrolate, LORazepam, morphine injection, ondansetron (ZOFRAN) IV, senna-docusate Medications Prior to Admission:   Prior to Admission medications   Medication Sig Start Date End Date Taking? Authorizing Provider  acetaminophen (TYLENOL) 500 MG tablet Take 1,000 mg by mouth every 6 (six) hours as needed for moderate pain.    Yes [provider]  amiodarone (PACERONE) 200 MG tablet TAKE ONE TABLET BY MOUTH ONCE DAILY 10/31/17  Yes Bensimhon, Shaune Pascal, MD  Cholecalciferol (VITAMIN D3) 3000 units TABS Take 3,000 Units by mouth daily.   Yes [provider]  citalopram (CELEXA) 20 MG tablet Take 1 tablet (20 mg total) by mouth daily.  Patient taking differently: Take 10 mg by mouth daily.  05/09/17  Yes Clegg, Amy D, NP  cyclobenzaprine (FLEXERIL) 10 MG tablet Take 1 tablet (10 mg total) by mouth 3 (three) times daily as needed for muscle spasms. 11/30/17  Yes Larey Dresser, MD  DOBUTamine (DOBUTREX) 4-5 MG/ML-% infusion Inject 714.75 mcg/min into the vein continuous. PER [facility] pharmacy 11/21/17  Yes Clegg, Amy D, NP  docusate sodium (COLACE) 100 MG capsule Take 100 mg by mouth at bedtime as needed (constipation).    Yes [provider]  furosemide (LASIX) 10 MG/ML injection Inject 120 mg into the muscle daily as needed (weight gain or equal to 208).   Yes [provider]  gabapentin (NEURONTIN) 300 MG capsule Take 1 capsule (300 mg total) by mouth 2 (two) times daily. 09/30/17  Yes Bensimhon, Shaune Pascal, MD  Insulin Detemir (LEVEMIR FLEXTOUCH) 100 UNIT/ML Pen Inject 10 Units into the skin at bedtime.   Yes [provider]  lactulose (CHRONULAC) 10 GM/15ML solution Take 67.5 mLs (45 g total) by mouth 3 (three) times daily. 01/27/18  Yes Clegg, Amy D, NP  levothyroxine (SYNTHROID, LEVOTHROID) 50 MCG tablet Take 1 tablet (50 mcg total) by mouth daily before breakfast. 03/03/18  Yes Georgiana Shore, NP  magnesium oxide (MAG-OX) 400 (241.3 Mg) MG tablet Take 1 tablet (400 mg total) by mouth daily. 05/09/17  Yes Clegg, Amy D, NP  metolazone (ZAROXOLYN) 2.5 MG tablet Take 1 tablet (2.5 mg  total) by mouth every other day. 04/08/18  Yes Georgiana Shore, NP  midodrine (PROAMATINE) 10 MG tablet Take 1 tablet (10 mg total) by mouth 3 (three) times daily with meals. 11/21/17  Yes Clegg, Amy D, NP  Multiple Vitamin (MULTIVITAMIN WITH MINERALS) TABS tablet Take 1 tablet by mouth daily. Thera-M   Yes [provider]  ondansetron (ZOFRAN) 4 MG tablet Take 4 mg by mouth every 8 (eight) hours as needed for nausea or vomiting.   Yes [provider]  OXYGEN Inhale 2 L into the lungs as needed (for SATS below 90%).   Yes [provider]  pantoprazole (PROTONIX) 40 MG tablet TAKE 1 TABLET BY MOUTH TWICE DAILY WITH A MEAL 01/05/18  Yes Bensimhon, Shaune Pascal, MD  potassium chloride SA (K-DUR,KLOR-CON) 20 MEQ tablet Take 2 tablets (40 mEq total) by mouth 2 (two) times daily. Give extra 40 meq on days pt receives prn IV Lasix 04/28/18  Yes Bensimhon, Shaune Pascal, MD  senna-docusate (SENOKOT S) 8.6-50 MG tablet Take 2 tablets by mouth at bedtime as needed for mild constipation. 05/09/17  Yes Clegg, Amy D, NP  spironolactone (ALDACTONE) 25 MG tablet Take 1 tablet (25 mg total) by mouth daily. 01/27/18  Yes Clegg, Amy D, NP  sucralfate (CARAFATE) 1 GM/10ML suspension Take 10 mLs (1 g total) by mouth 4 (four) times daily -  with meals and at bedtime. 05/09/17  Yes Clegg, Amy D, NP  torsemide (DEMADEX) 100 MG tablet Take 1 tablet (100 mg total) by mouth 2 (two) times daily. Take 139m in the morning and 546min the evening 03/28/18  Yes Bensimhon, DaShaune PascalMD  traZODone (DESYREL) 50 MG tablet Take 1 tablet (50 mg total) by mouth at bedtime as needed for sleep. 12/20/17  Yes TiShirley FriarPA-C  warfarin (COUMADIN) 5 MG tablet Take 47m38m1tab) Monday, Wednesday and Friday, take 7.47mg22m.5tab) Tuesday, Thursday, Saturday and Sunday 03/13/18  Yes Bensimhon, DaniShaune Pascal   Allergies  Allergen Reactions  .  Chlorhexidine Gluconate Itching and Rash  . Codeine Nausea And Vomiting and Other (See  Comments)    In high doses  . Lipitor [Atorvastatin] Nausea Only and Other (See Comments)    Nausea with high doses, tolerates 75m dose (08/22/16)  . Tape Other (See Comments)    Paper tape is ok   Review of Systems  Constitutional: Positive for activity change, appetite change and fatigue.  Respiratory: Negative for shortness of breath.   Psychiatric/Behavioral: Negative for sleep disturbance. The patient is not nervous/anxious.     Physical Exam  Constitutional: He appears well-developed and well-nourished.  Cardiovascular:  tachycardic  Pulmonary/Chest: Effort normal.  Abdominal: He exhibits distension.  Skin: Skin is warm and dry.  Psychiatric: He has a normal mood and affect. His behavior is normal. Judgment and thought content normal.  Nursing note and vitals reviewed.   Vital Signs: BP (!) 79/66 (BP Location: Left Arm)   Pulse 86   Temp 97.6 F (36.4 C) (Oral)   Resp 15   Ht '5\' 9"'  (1.753 m)   Wt 90.4 kg   SpO2 98%   BMI 29.43 kg/m  Pain Scale: 0-10 POSS *See Group Information*: 1-Acceptable,Awake and alert Pain Score: 0-No pain   SpO2: SpO2: 98 % O2 Device:SpO2: 98 % O2 Flow Rate: .   IO: Intake/output summary:   Intake/Output Summary (Last 24 hours) at 05/16/2018 1439 Last data filed at 05/16/2018 0800 Gross per 24 hour  Intake 1012.85 ml  Output 3175 ml  Net -2162.15 ml    LBM: Last BM Date: 05/15/18 Baseline Weight: Weight: 94.8 kg Most recent weight: Weight: 90.4 kg     Palliative Assessment/Data: PPS: 40%     Thank you for this consult. Palliative medicine will continue to follow and assist as needed.   Time In: 1400 Time Out: 1530 Time Total: 90 minutes Prolonged services billed: Yes Greater than 50%  of this time was spent counseling and coordinating care related to the above assessment and plan.  Signed by: KMariana Kaufman AGNP-C Palliative Medicine    Please contact Palliative Medicine Team phone at [phone] questions and  concerns.  For individual provider: See AShea Evans

## 2018-05-16 NOTE — Progress Notes (Addendum)
Patient ID: Ronald Miller., male   DOB: [birth year], 62 y.o.   MRN: [id number]   Advanced Heart Failure VAD Team Note  PCP-Cardiologist: Glori Bickers, MD   Subjective:    Remains on lasix drip @ 30 mg/hr + metolazone + diamox. Na still low at 126.   Weight unchanged.   Remains on dobutamine 7.5 mcg/kg/min.   Paracentesis 8/8 with 2L off.  Tired this am. Has decided he would like Hospice. Will discuss timing with MD. Denies SOB. Mouth dry, asking for drink.    CVP ~16. MAP 70s  INR 2.86. Labs erroneous this am. ? Accuracy. K 2.8. Supp in.    LVAD Interrogation HM 3: Speed: 5800 Flow: 5.5 PI: 2.9 Power: 5.0. Occ PI events   Objective:    Vital Signs:   Temp:  [97.4 F (36.3 C)-98.3 F (36.8 C)] 97.6 F (36.4 C) (08/13 0447) Pulse Rate:  [64-97] 64 (08/13 0447) Resp:  [13-17] 13 (08/13 0447) BP: (77-110)/(45-102) 83/62 (08/13 0447) SpO2:  [92 %-100 %] 98 % (08/13 0447) Weight:  [90.4 kg] 90.4 kg (08/13 0447) Last BM Date: 05/15/18 Mean arterial Pressure 70s  Intake/Output:   Intake/Output Summary (Last 24 hours) at 05/16/2018 0754 Last data filed at 05/16/2018 0400 Gross per 24 hour  Intake 1697.87 ml  Output 4700 ml  Net -3002.13 ml     Physical Exam    General: Chronically ill appearing.  NAD HEENT: Normal. Anicteric  Neck: Supple, JVP to jaw cm. Carotids OK.  Cardiac:  Mechanical heart sounds with LVAD hum present.  Lungs:  CTAB, normal effort.  No wheeze  Abdomen:  NT, ND, no HSM. No bruits or masses. +BS  LVAD exit site: Dressing dry and intact. No erythema or drainage. Stabilization device present and accurately applied. Driveline dressing changed daily per sterile technique. Extremities:  Warm and dry. No cyanosis, clubbing, or rash. Trace-1+ edema.  Neuro:  Alert & oriented but confused at times . Cranial nerves grossly intact. Moves all 4 extremities w/o difficulty. Affect flat but appropriate.   Telemetry   AV paced 80s, personally reviewed.     EKG    No new tracings.    Labs   Basic Metabolic Panel: Recent Labs  Lab 05/13/18 0500 05/14/18 0341 05/15/18 0602 05/16/18 0422 05/16/18 0600  NA 124* 123* 124* 129* 126*  K 2.6* 2.9* 4.3 <2.0* 2.8*  CL 77* 76* 78* 101 77*  CO2 34* 36* 35* 21* 36*  GLUCOSE 180* 189* 202* 392* 205*  BUN 33* 32* 37* 25* 41*  CREATININE 1.45* 1.65* 1.63* 1.16 1.89*  CALCIUM 8.3* 8.4* 8.7* 5.2* 8.7*    Liver Function Tests: No results for input(s): AST, ALT, ALKPHOS, BILITOT, PROT, ALBUMIN in the last 168 hours. No results for input(s): LIPASE, AMYLASE in the last 168 hours. Recent Labs  Lab 05/11/18 1428  AMMONIA 57*    CBC: Recent Labs  Lab 05/12/18 0339 05/13/18 0500 05/14/18 0341 05/15/18 0602 05/16/18 0458  WBC 7.0 5.6 5.6 6.0 5.9  HGB 8.7* 8.5* 8.5* 8.4* 7.9*  HCT 30.3* 29.4* 29.5* 29.4* 28.0*  MCV 78.1 77.2* 76.4* 77.0* 77.6*  PLT 176 172 163 181 190    INR: Recent Labs  Lab 05/12/18 0339 05/13/18 0500 05/14/18 0341 05/15/18 0602 05/16/18 0422  INR 1.96 1.74 1.78 1.85 2.86    Other results:     Imaging   No results found.   Medications:     Scheduled Medications: . acetaZOLAMIDE  250 mg  Oral BID  . amiodarone  200 mg Oral Daily  . gabapentin  300 mg Oral BID  . insulin aspart  0-20 Units Subcutaneous TID WC  . insulin aspart  5 Units Subcutaneous TID WC  . lactulose  45 g Oral TID  . levothyroxine  50 mcg Oral QAC breakfast  . magnesium oxide  400 mg Oral Daily  . metolazone  5 mg Oral BID  . midodrine  15 mg Oral TID WC  . mupirocin cream   Topical Daily  . pantoprazole  40 mg Oral Daily  . potassium chloride  40 mEq Oral TID  . spironolactone  25 mg Oral Daily  . sucralfate  1 g Oral TID WC & HS  . Warfarin - Pharmacist Dosing Inpatient   Does not apply q1800    Infusions: . DOBUTamine 7.5 mcg/kg/min (05/16/18 0445)  . furosemide (LASIX) infusion 30 mg/hr (05/15/18 2103)    PRN Medications: acetaminophen, docusate sodium,  ondansetron (ZOFRAN) IV, ondansetron, senna-docusate   Patient Profile   EMON LANCE Jr.is a 62 y.o.malewith history of chronic systolic HF s/p Medtronic ICD 2014, CAD s/p CABG in 2000, HTN, Hx of cocaine abuse, Tobacco abuse, Depression, PAF, and COPD. HM3 placed 09/09/2016. 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 He remains on dobutamine 7.43mcg.   Admitted 8/5 with A/C systolic heart failure   Assessment/Plan:    1. 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 Continue Dobutamine 7.5 mcg chronically for RV failure. He continues to deteriorate with severe volume overload and low MAPs.   - Volume status has made very slow progress with with paracentesis - Continue lasix drip @ 30 mg/hr, metolazone 5 mg BID and diamox 250 mg BID.  - CVP 14-16 cm - Continue spironolactone 25 mg daily with K repletion.  - No BB with RV failure - MAP improved on higher midodrine 15 mg tid.  - Continue current diuretic regimen.  - Consider repeat tolvaptan dose with persistently low sodium.  - Continue dobutamine 10.  - Continue coumadin. INR goal 2-2.5. INR 2.85 today. Recheck. Holding off on heparin.  - Overall continues to deteriorate despite VAD support and high-dose inotropes for RV support. He is near or at end-stage and this is complicated by severe noncompliance despite residing in SNF. We discussed hospice/comfort care again today.  He wants to discuss more with his family but think he is leaning in that direction.  2. HMIII:  - VAD interrogated personally. Parameters stable.   - LDH 62 (?) 3. Hyponatremia:  - Received tolvaptan x1 8/6 and 8/11. Na 126 - He is doing better with free water limitation.  4. Anemia: Received 2UPRBCs on 8/1 for hgb 7.7.  - Hgb 7.9 with no overt bleeding.  5. Hyperkalemia/hypokalemia:  - K dropped significantly, though labs were significant skewed  this am. Repeat with K 2.8. Supp ordered.  6. PAF: - Remains in NSR.  - On coumadin.  7. Cirrhosis with hepatic encephalopathy:  - Lactulose increased 8/6. Repeat paracentesis 05/11/18 with 2 L of fluid off. - Intermittent confusion.  8. RLE partial thickness wound:  - WOC following. Recommending roam dressing and bactroban. No change.   He has requested we discuss disposition with his Sister.   I reviewed the LVAD parameters from today, and compared the results to the patient's prior recorded data.  No programming changes were made.  The LVAD is  functioning within specified parameters.  The patient performs LVAD self-test daily.  LVAD interrogation was negative for any significant power changes, alarms or PI events/speed drops.  LVAD equipment check completed and is in good working order.  Back-up equipment present.   LVAD education done on emergency procedures and precautions and reviewed exit site care.   Length of Stay: [address]  Annamaria Helling 05/16/2018, 7:54 AM  VAD Team --- VAD ISSUES ONLY--- Pager [phone] (7am - 7am)  Advanced Heart Failure Team  Pager [phone] (M-F; 7a - 4p)  Please contact [facility] for night-coverage after hours (4p -7a ) and weekends on [email or web address]  After extensive discussion with him and his family, have decided on shifting to comfort care. Will turn off dobutamine and all non-comfort meds including midodrine. Once patient becomes symptomatic (will not be long) will start morphine/versed and stop LVAD. Suspect in hospital death. Will get Palliative team involved for support. VAD interrogated personally. Parameters stable. Will stop coumadin. Personally reviewed  Glori Bickers, MD  8:51 AM

## 2018-05-17 MED ORDER — SODIUM CHLORIDE 0.9 % IV SOLN
2.0000 mg/h | INTRAVENOUS | Status: DC
  Administered 2018-05-17: 2 mg/h via INTRAVENOUS
  Filled 2018-05-17 (×3): qty 10

## 2018-05-17 MED ORDER — HALOPERIDOL LACTATE 5 MG/ML IJ SOLN: 0.5000 mg | INTRAMUSCULAR | Status: DC | PRN

## 2018-05-17 MED ORDER — HALOPERIDOL LACTATE 2 MG/ML PO CONC
0.5000 mg | ORAL | Status: DC | PRN
  Filled 2018-05-17: qty 0.3

## 2018-05-17 MED ORDER — MIDAZOLAM BOLUS VIA INFUSION
1.0000 mg | INTRAVENOUS | Status: DC | PRN
  Filled 2018-05-17: qty 1

## 2018-05-17 MED ORDER — MORPHINE 100MG IN NS 100ML (1MG/ML) PREMIX INFUSION
4.0000 mg/h | INTRAVENOUS | Status: DC
  Administered 2018-05-17: 4 mg/h via INTRAVENOUS
  Filled 2018-05-17: qty 100

## 2018-05-17 MED ORDER — ONDANSETRON HCL 4 MG/2ML IJ SOLN
4.0000 mg | Freq: Four times a day (QID) | INTRAMUSCULAR | Status: DC
  Administered 2018-05-18: 4 mg via INTRAVENOUS
  Filled 2018-05-17: qty 2

## 2018-05-17 MED ORDER — HALOPERIDOL 0.5 MG PO TABS
0.5000 mg | ORAL_TABLET | ORAL | Status: DC | PRN
  Filled 2018-05-17: qty 1

## 2018-05-17 MED ORDER — PROCHLORPERAZINE EDISYLATE 10 MG/2ML IJ SOLN
10.0000 mg | Freq: Four times a day (QID) | INTRAMUSCULAR | Status: DC | PRN
Start: 2018-05-17 — End: 2018-05-18
  Filled 2018-05-17: qty 2

## 2018-05-17 MED ORDER — BIOTENE DRY MOUTH MT LIQD: 15.0000 mL | OROMUCOSAL | Status: DC | PRN

## 2018-05-17 MED ORDER — MORPHINE BOLUS VIA INFUSION
2.0000 mg | INTRAVENOUS | Status: DC | PRN
  Filled 2018-05-17: qty 2

## 2018-05-17 MED ORDER — HYPROMELLOSE (GONIOSCOPIC) 2.5 % OP SOLN
1.0000 [drp] | Freq: Four times a day (QID) | OPHTHALMIC | Status: DC | PRN
  Filled 2018-05-17: qty 15

## 2018-05-17 MED ORDER — POLYVINYL ALCOHOL 1.4 % OP SOLN: 1.0000 [drp] | Freq: Four times a day (QID) | OPHTHALMIC | Status: DC | PRN

## 2018-05-17 NOTE — Progress Notes (Addendum)
Patient ID: Ronald Miller., male   DOB: Feb 12, 1956, 62 y.o.   MRN: [id number]   Advanced Heart Failure VAD Team Note  PCP-Cardiologist: Glori Bickers, MD   Subjective:    Drips stopped 05/16/18 with decision to move to Hospice/Comfort Care.   More tired this am, and confused. Has trouble coming up with words. He denies SOB at rest, but + with activity. Asks about [employer]. Feels weak. Asking for drinks.   No labs with goal of comfort care.   LVAD Interrogation HM 3: Speed: 5800 Flow: 5.5 PI: 1.9 Power: 5.0. No PI events since 0900 05/16/18.   Objective:    Vital Signs:   Temp:  [97.6 F (36.4 C)] 97.6 F (36.4 C) (08/13 0758) Pulse Rate:  [86] 86 (08/13 0800) Resp:  [15] 15 (08/13 0758) BP: (79)/(66) 79/66 (08/13 0758) SpO2:  [98 %] 98 % (08/13 0800) Last BM Date: 05/16/18 Mean arterial Pressure not obtained.   Intake/Output:   Intake/Output Summary (Last 24 hours) at 05/17/2018 0718 Last data filed at 05/17/2018 0200 Gross per 24 hour  Intake 420.7 ml  Output 500 ml  Net -79.3 ml     Physical Exam    General: Chronically ill and fatigued appearing. NAD.   HEENT: Normal. Anicteric.  Neck: Supple, JVP to jaw. Carotids OK.  Cardiac:  Mechanical heart sounds with LVAD hum present.  Lungs:  CTAB, normal effort. No wheeze  Abdomen:  NT, distended, no HSM. No bruits or masses. +BS  LVAD exit site: Dressing dry and intact. No erythema or drainage. Stabilization device present and accurately applied. Driveline dressing changed daily per sterile technique. Extremities:  Warm and dry. No cyanosis, clubbing, or rash. 1-2+ edema with dependent edema in thighs.  Neuro:  Alert & oriented x 3. Cranial nerves grossly intact. Moves all 4 extremities w/o difficulty. Affect flat but appropriate.   Telemetry   Not connected.   EKG    No new tracings.    Labs   Basic Metabolic Panel: Recent Labs  Lab 05/13/18 0500 05/14/18 0341 05/15/18 0602 05/16/18 0422  05/16/18 0600  NA 124* 123* 124* 129* 126*  K 2.6* 2.9* 4.3 <2.0* 2.8*  CL 77* 76* 78* 101 77*  CO2 34* 36* 35* 21* 36*  GLUCOSE 180* 189* 202* 392* 205*  BUN 33* 32* 37* 25* 41*  CREATININE 1.45* 1.65* 1.63* 1.16 1.89*  CALCIUM 8.3* 8.4* 8.7* 5.2* 8.7*    Liver Function Tests: No results for input(s): AST, ALT, ALKPHOS, BILITOT, PROT, ALBUMIN in the last 168 hours. No results for input(s): LIPASE, AMYLASE in the last 168 hours. Recent Labs  Lab 05/11/18 1428  AMMONIA 57*    CBC: Recent Labs  Lab 05/12/18 0339 05/13/18 0500 05/14/18 0341 05/15/18 0602 05/16/18 0458  WBC 7.0 5.6 5.6 6.0 5.9  HGB 8.7* 8.5* 8.5* 8.4* 7.9*  HCT 30.3* 29.4* 29.5* 29.4* 28.0*  MCV 78.1 77.2* 76.4* 77.0* 77.6*  PLT 176 172 163 181 190    INR: Recent Labs  Lab 05/13/18 0500 05/14/18 0341 05/15/18 0602 05/16/18 0422 05/16/18 0756  INR 1.74 1.78 1.85 2.86 1.68    Other results:     Imaging   No results found.   Medications:     Scheduled Medications:   Infusions:   PRN Medications: acetaminophen, glycopyrrolate **OR** glycopyrrolate **OR** glycopyrrolate, LORazepam, morphine injection, ondansetron (ZOFRAN) IV, senna-docusate, traZODone   Patient Profile   Ronald Milleris a 62 y.o.malewith history of chronic systolic HF  s/p Medtronic ICD 2014, CAD s/p CABG in 2000, HTN, Hx of cocaine abuse, Tobacco abuse, Depression, PAF, and COPD. HM3 placed 09/09/2016. 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 He remains on dobutamine 7.68mcg.   Admitted 8/5 with A/C systolic heart failure   Assessment/Plan:    1. 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 Continue Dobutamine 7.5 mcg chronically for RV failure. He continues to deteriorate with severe volume overload and low MAPs.   - Volume status had made very slow progress with with paracentesis. With  frequent hospitalizations and gradual decline, pt and family decided on comfort care.  - All drips stopped. Only comfort meds in place.  2. HMIII:  - VAD interrogated personally. Parameters stable.   3. Hyponatremia:  - Received tolvaptan x1 8/6 and 8/11. Na 126 05/16/18 - Liberalized diet with comfort care.  4. Anemia: Received 2UPRBCs on 8/1 for hgb 7.7.  - No overt bleeding.   5. Hyperkalemia/hypokalemia:  - No further labs with comfort care.  6. PAF: - Regular on exam.  - Coumadin stopped with comfort care.  7. Cirrhosis with hepatic encephalopathy:  - Lactulose increased 8/6. Repeat paracentesis 05/11/18 with 2 L of fluid off. - Intermittent confusion. Suspect will get worse as he declines.  8. RLE partial thickness wound:  - WOC following. Recommending roam dressing and bactroban.  - No change. Comfort.   Suspect prognosis days to < 2 weeks.   I reviewed the LVAD parameters from today, and compared the results to the patient's prior recorded data.  No programming changes were made.  The LVAD is functioning within specified parameters.  The patient performs LVAD self-test daily.  LVAD interrogation was negative for any significant power changes, alarms or PI events/speed drops.  LVAD equipment check completed and is in good working order.  Back-up equipment present.   LVAD education done on emergency procedures and precautions and reviewed exit site care.   Length of Stay: [address]  Annamaria Helling 05/17/2018, 7:18 AM  VAD Team --- VAD ISSUES ONLY--- Pager [phone] (7am - 7am)  Advanced Heart Failure Team  Pager [phone] (M-F; 7a - 4p)  Please contact [facility] for night-coverage after hours (4p -7a ) a  Patient seen and examined with the above-signed Advanced Practice Provider and/or Housestaff. I personally reviewed laboratory data, imaging studies and relevant notes. I independently examined the patient and formulated the important aspects of the plan. I have  edited the note to reflect any of my changes or salient points. I have personally discussed the plan with the patient and/or family.  He is now comfort care. Dobutamine turned off. No longer getting lactulose. He is weak and confused but in no significant distress. Will continue comfort care. Once he deteriorates will start comfort drips and deactivated VAD. Likely in next 24-48 hours. VAD interrogated personally. Parameters stable. Discussed with patient and family. Appreciate Palliative Care team input.  Glori Bickers, MD  9:28 AM

## 2018-05-17 NOTE — Progress Notes (Signed)
Visited with Mr. Blankley this morning.  Patient is grimacing and thought he wanted to ask me something but then tells me that he couldn't remember what it was he was going to say.  I asked him if he was in pain and he says no but he also states that he doesn't want to talk anymore because he is tired of talking about it all.  I shared with him that I am available to him and wanted to be present as much as he needed.  He expressed his appreciation and I exited the room and shared with him that I would visit again later.  Thank you to his sweet nurse and the medical professional team for caring for him.    05/17/18 1249  Clinical Encounter Type  Visited With Patient;Health care provider  Visit Type Initial;Spiritual support;Psychological support

## 2018-05-17 NOTE — Progress Notes (Signed)
Chaplain stopped by and visited with the father of the patient, [name].  [name] shares about his late wife that he divorced early in their union but they remained friends.  He talks about her having the same condition with a heart pump and she passed away over a year ago due to a fall.  She says she and his son, have the same doctor.  He is comfortable because he says his son made his own decision and he is happy about that.    We are here to provide support and spiritual care for them.  Thank you for letting us be present.  Thank you to the medical team for their care for [name] and his family.    05/17/18 1456  Clinical Encounter Type  Visited With Patient;Family  Visit Type Follow-up;Spiritual support

## 2018-05-17 NOTE — Progress Notes (Signed)
CSW met at bedside with patient, mother, father [name] and brother Scotty. Patient resting comfortably and not easily aroused. Family shared memories of patient and family ties. Family verbalize understanding of current plan of care and comfort measures. CSW offered support and will be available as needed. Raquel Sarna, [place], [place]

## 2018-05-17 NOTE — Progress Notes (Addendum)
No charge note:   Visited patient at bedside. Reporting increasing discomfort in abdomen, new onset of cough. Having increasing confusion and urinary retention.  Has received 10 mg IV morphine in the last 5 hours with ongoing discomfort.  Recommend IV ativan push now. Will place foley catheter.  Will start morphine continuous infusion at 4mg /hr with 2mg  IV bolus.  Patient appears much more comfortable after lorazepam push.   Per discussion with Oda Kilts PA- will also start Versed infusion.   Family is at bedside. Gave emotional support to [facility]- she states that "the doctor has well prepared [place] for this". Her hope is that he "he just goes to sleep and drifts away".   Mariana Kaufman, AGNP-C Palliative Medicine  Please call Palliative Medicine team phone with any questions [phone]. For individual providers please see AMION.
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
## 2018-05-18 ENCOUNTER — Encounter (HOSPITAL_COMMUNITY): Payer: Self-pay

## 2018-06-04 NOTE — Death Summary Note (Addendum)
  Advanced Heart Failure Death Summary  Death Summary   Patient ID: Ronald Miller. MRN: [id number], DOB/AGE: Feb 15, 1956 62 y.o. Admit date: 05/09/2018 D/C date:     20-May-2018   Primary Discharge Diagnoses:  1. A/C Biventricular HF- ECHO 2018 Ramp ECHO 2. LVAD present, HMIII 3. Hyponatremia 4. Anemia 5. Hyperkalemia 6. PAF: 7. Cirrhosis with hepatic encephalopathy 8. RLE partial thickness wound 9. CKD III  Hospital Course:   Ronald Miller. was a 63 y.o. male with history of chronic systolic HF s/p Medtronic ICD 2014, CAD s/p CABG in 2000, HTN, Hx of cocaine abuse, Tobacco abuse, Depression, PAF, and COPD. HM3 placed 09/09/2016. 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 He remains on dobutamine 7.6mcg.  He was admitted 8/5 with A/C systolic heart failure. This was his 5th admission for the same over the past 6 months, with the time in between admissions becoming shorter, and his condition gradually becoming worse. He had marked volume overload on admission requiring lasix drip up to 30 mg/hr with BID metolazone and diamox. He required 2 paracentesis with multiple liters off.    Pt made very little progress this admission and had long and frank discussion about his condition and prognosis. He remained volume overloaded and intermittently confused despite max doses of diuretics. Discussed options for going home vs back to SNF vs full hospice care. Given the circumstances and trajectory, pt himself in consultation with his family decided on full comfort care.   His dobutamine was stopped 05/16/18 and his fluid and diet were liberalized at his request.  He became increasingly uncomfortable over night and comfort medicines started in conjunction with Palliative Care team. 05/17/18 afternoon he continued to worsen, and started on morphine and versed drips. Per family request, had planned to turn  off LVAD am of May 20, 2018 at 0900 with family present, though concern was expressed that patient could pass overnight.   Pt developed worsening respiratory status and low flow events overnight into May 20, 2018. Pt noted to be apneic at 0324. RN instructed via phone by VAD coordinator, in conjunction with Dr. Haroldine Laws, to power off the patients LVAD. No heart sounds noted at 0328 after LVAD power turned off. Family and [place] notified. Ultimately, this patient passed away from multiple system organ failure in the setting of end stage biventricular heart failure.   Every effort was made during this and previous admissions to improve patients clinical picture.    Duration of Discharge Encounter: Greater than 35 minutes   Signed, [name] Friar, PA-C May 20, 2018, 7:07 AM

## 2018-06-04 NOTE — Progress Notes (Signed)
Responded to pager; pt passed; family en route.   First family arrived around 5:20. Offered presence, support, extra chairs. Pt's parents are still on their way.   Family said we did not need to stay. Prayed with family at bedside. Told them to let us know if there is anything else they need.

## 2018-06-04 NOTE — Progress Notes (Signed)
Patient prepped and transported to morgue by myself and tech.   Tilda [facility] 06-12-18 10:47 AM

## 2018-06-04 NOTE — Progress Notes (Signed)
0245 pt was noted to have a decline in respiratory status. Pt was provide mouth care and suction for comfort. 7 family was called a made aware in the pt's status and stated that they would come in.  Pt LVAD was assessed and noted to have frequent pi events and low speed rates. 70 Dr. Haroldine Laws was made aware of the pt decline and changes with his LVAD.he gave [place] permission to turn off LVAD if pt passed or had declined with frequent Low flow alarms and call the VAD coordinator for assistance with turning off the LVAD 0324 Low flow alarm. Flow rate was 1.8 and continued to decline. Pt had a breath and then no breathing noted. VAD coordinator page and returned page. The VAD Coordinator gave instructions on how to turn off LVAD.  0328 LVAD successfully turned off and not heart tones noted and this was verified by 2 RNs. [place] coordinator notified Dr. Haroldine Laws . [place] Dr. Haroldine Laws called RN and was updated. [place] was called  [phone]  [facility] service returned phone call and responded to bed side and are awaiting family.

## 2018-06-04 DEATH — deceased

## 2018-06-05 IMAGING — US US ABDOMEN LIMITED
1 series · 14 of 25 positions shown · non-contrast
Comparison: Abdominal ultrasound. CT abdomen and pelvis 03/19/2016.

CLINICAL DATA: Cirrhosis and ascites.

EXAM:
US ABDOMEN LIMITED - RIGHT UPPER QUADRANT

[Series 1: us abdomen limited · 0.28mm/px · 14 of 25 slices shown]
[im 1/25]
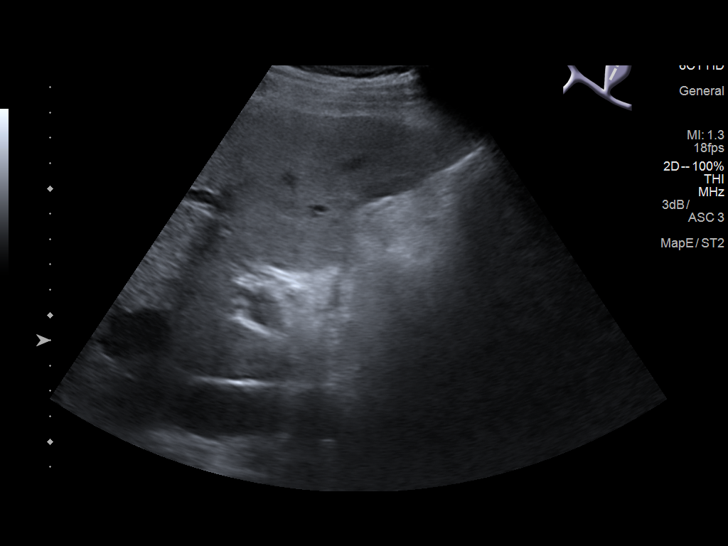
[im 3/25]
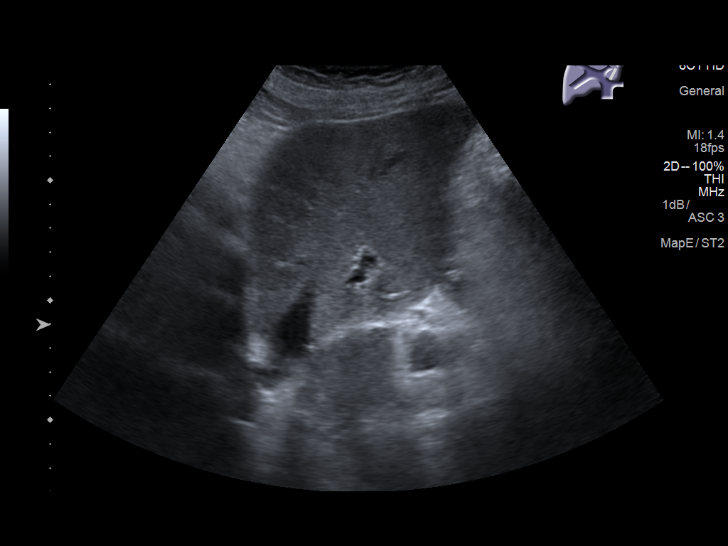
[im 5/25]
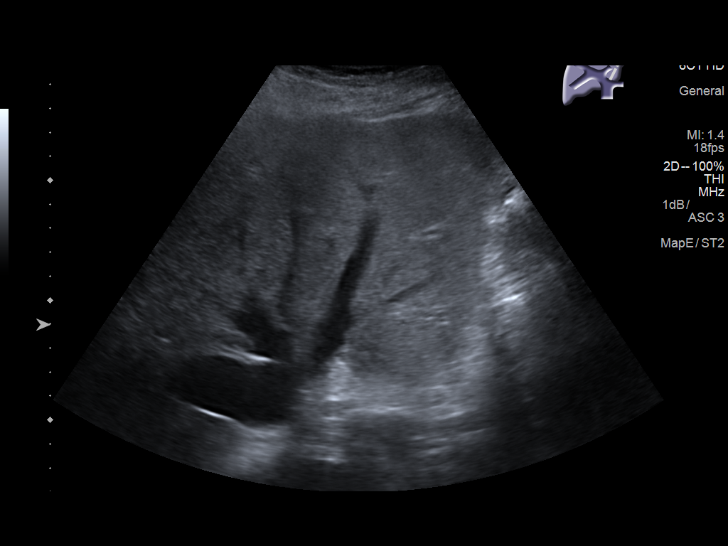
[im 7/25]
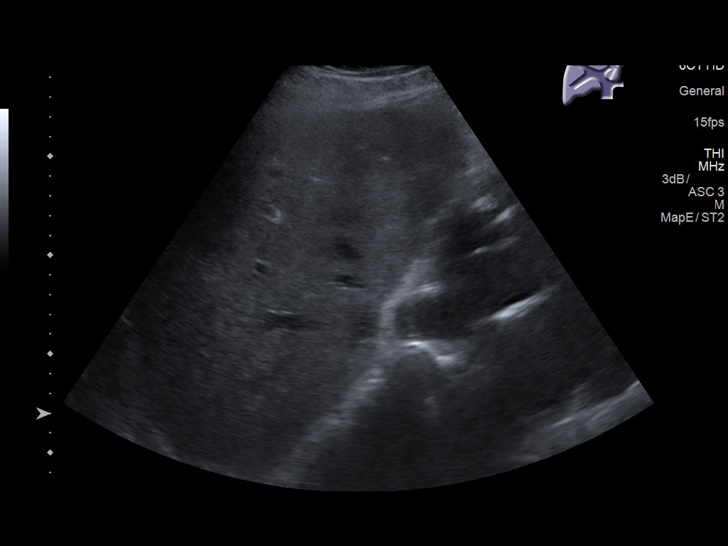
[im 9/25]
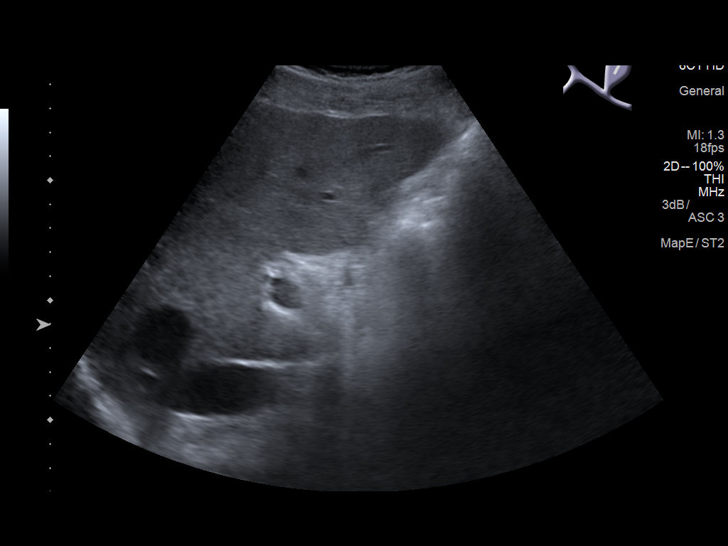
[im 10/25]
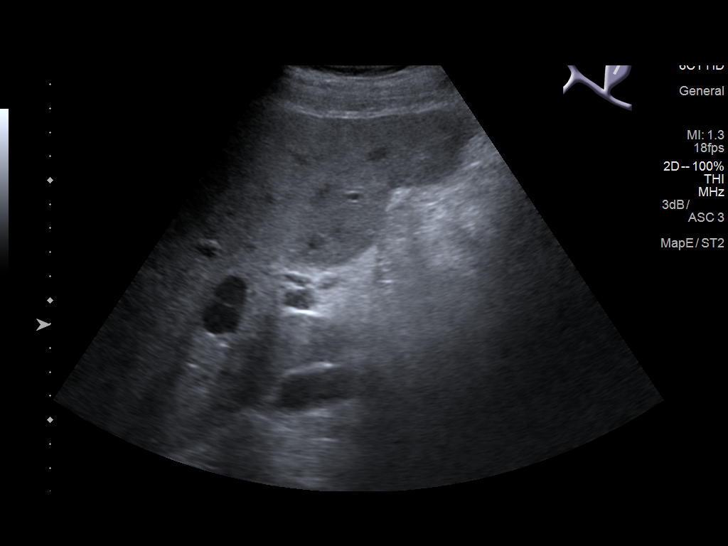
[im 12/25]
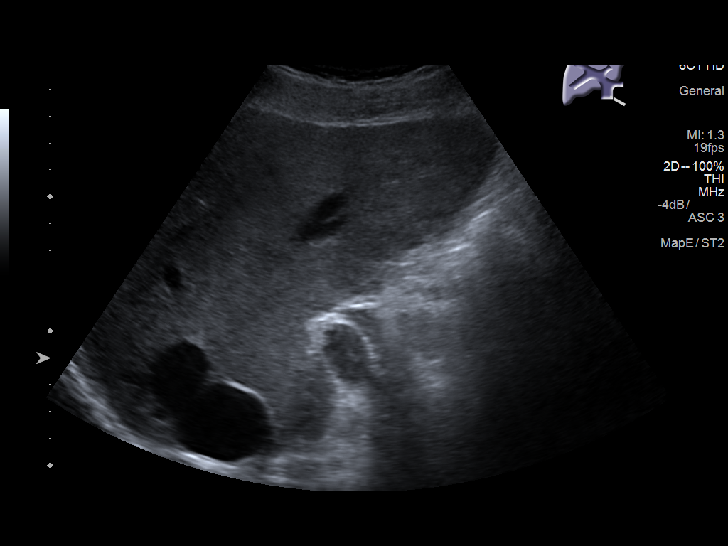
[im 14/25]
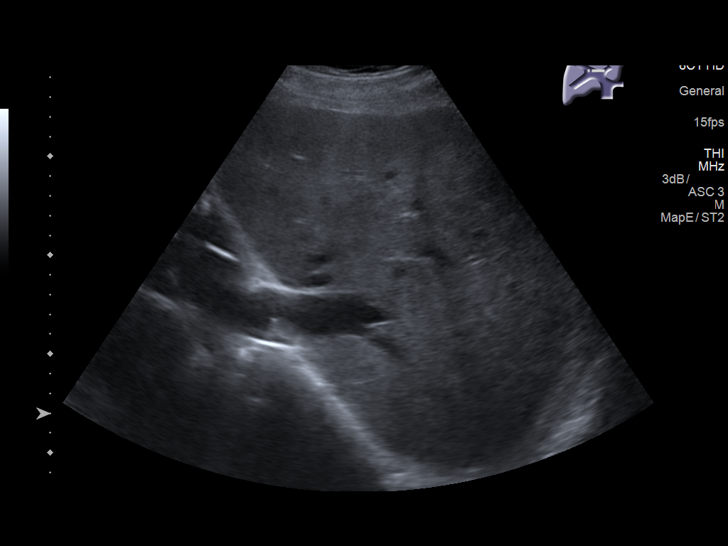
[im 16/25]
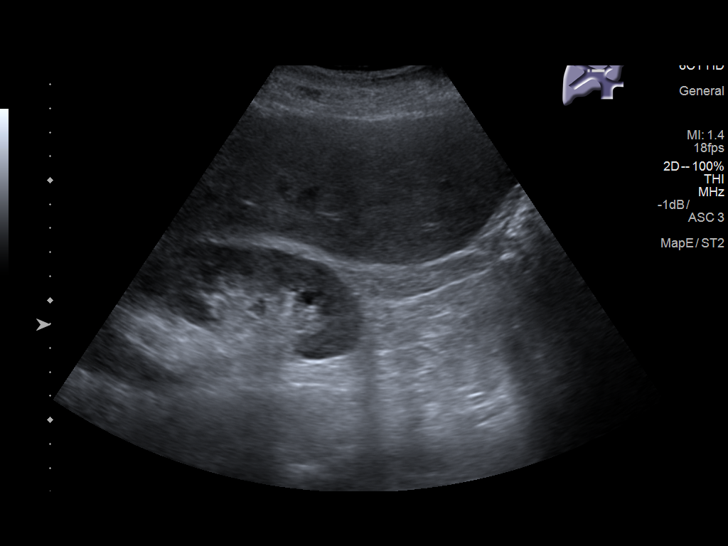
[im 17/25]
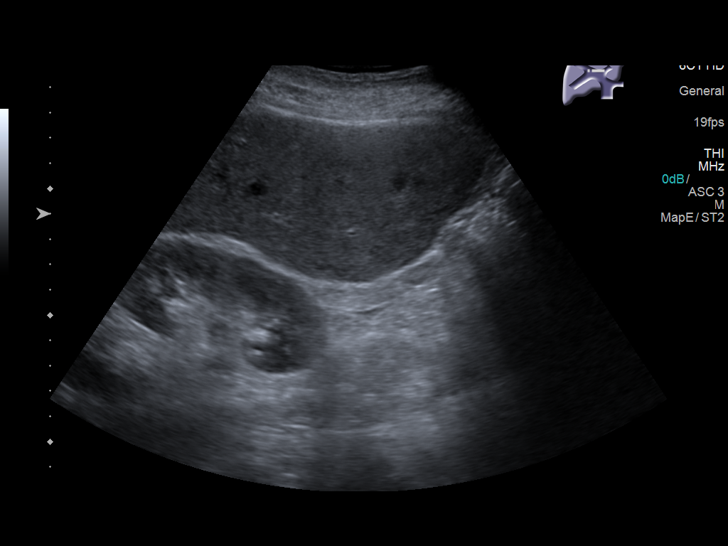
[im 19/25]
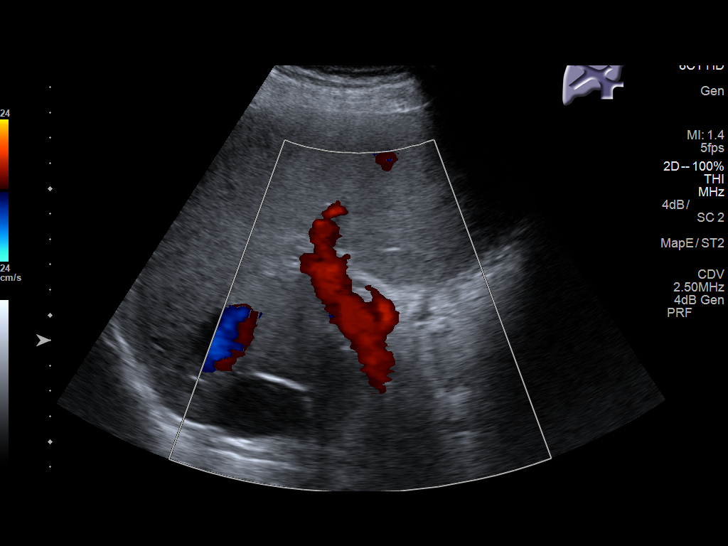
[im 21/25]
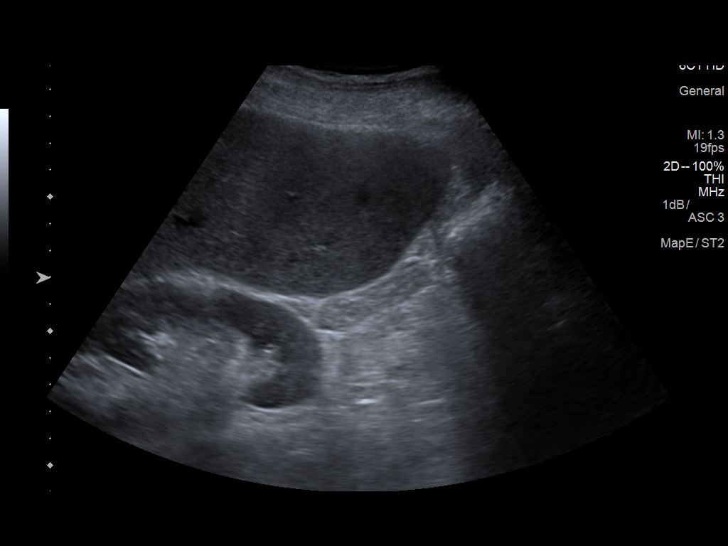
[im 23/25]
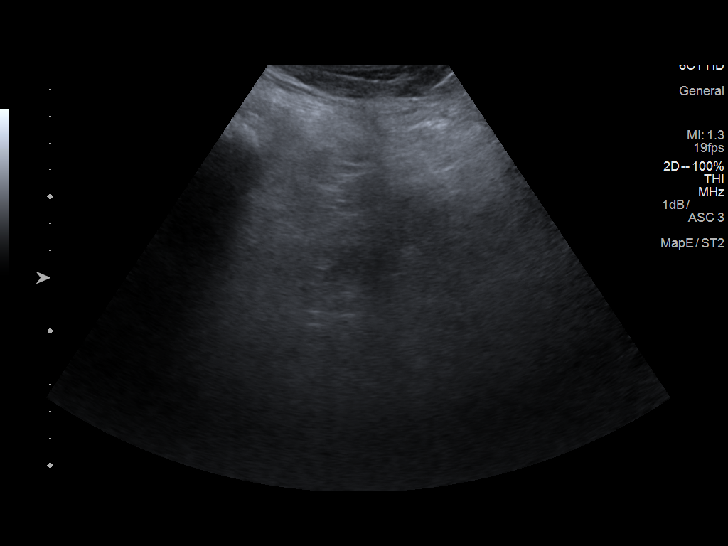
[im 25/25]
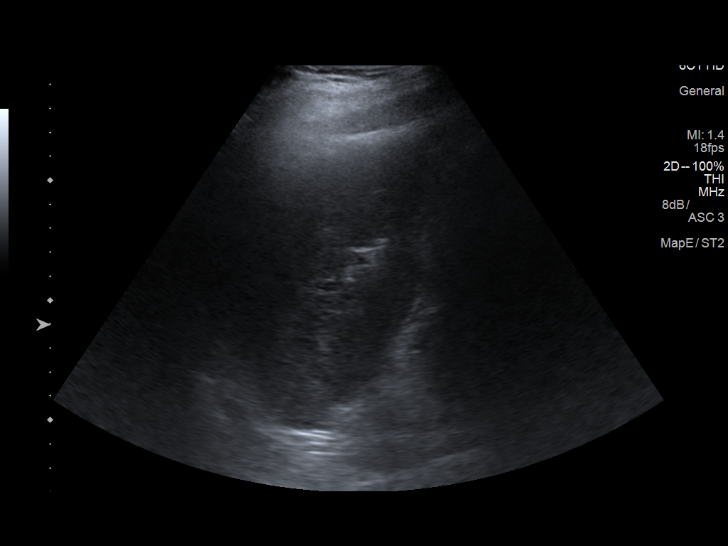

[14 of 25 positions shown; findings below may reference images not displayed]

FINDINGS: Gallbladder:

Removed.

Common bile duct:

Diameter:  0.9 cm.

Liver:

Demonstrates increased echogenicity and a nodular border. No focal
lesion or intrahepatic biliary ductal dilatation. No ascites is
identified.
IMPRESSION: Cirrhotic liver.  Negative for focal lesion or ascites.

## 2018-06-11 IMAGING — CT CT CHEST W/O CM
2 of 3 series · 14 of 36 positions shown, 17 images · non-contrast
Comparison: Chest radiograph performed earlier today at [DATE] p.m.

CLINICAL DATA: Acute onset of dyspnea and generalized abdominal
bloating. Difficulty sleeping. Initial encounter.

EXAM:
CT CHEST WITHOUT CONTRAST
TECHNIQUE: Multidetector CT imaging of the chest was performed following the
standard protocol without IV contrast.

[Series 3: chest w/o 2mm st · axial · non-contrast · 0.77mm/px · z∈[-448,-204]mm · 11 of 144 slices shown, 14 images]
[im 11/144  mediastinal]
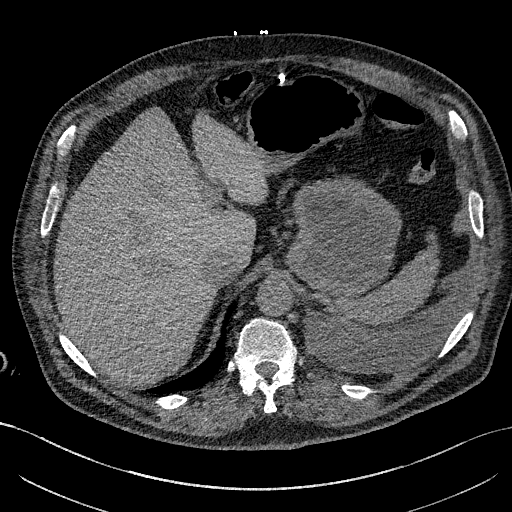
[im 11/144  lung]
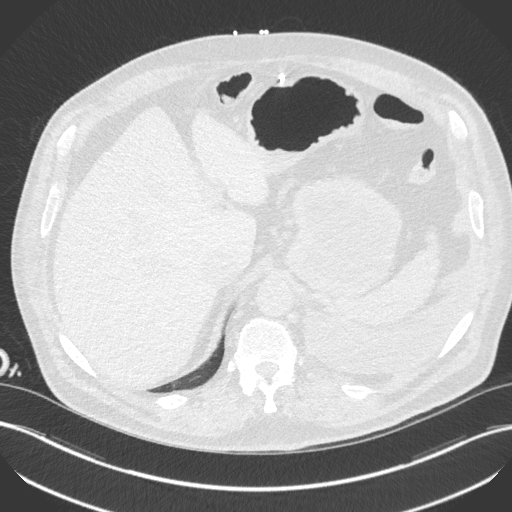
[im 22/144  lung]
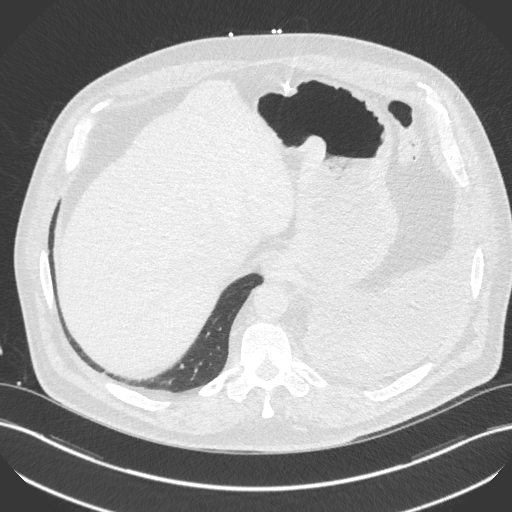
[im 32/144  lung]
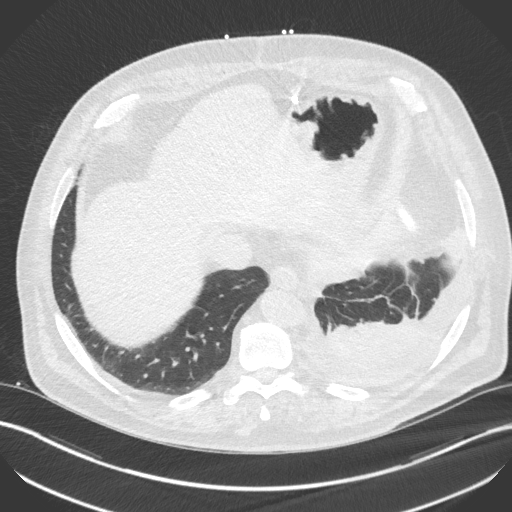
[im 48/144  lung]
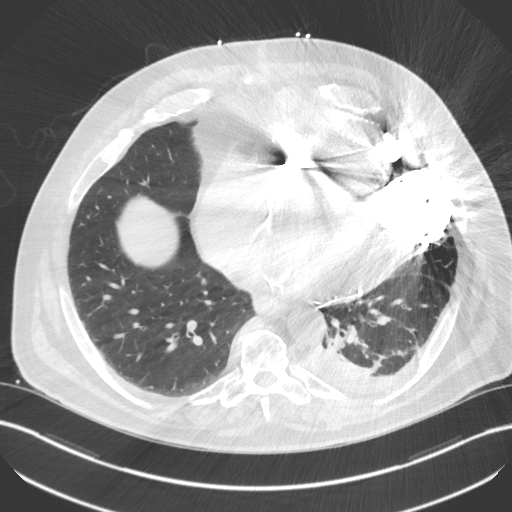
[im 59/144  mediastinal]
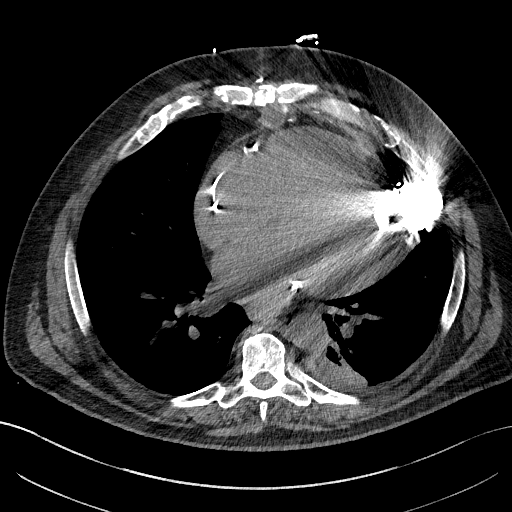
[im 59/144  lung]
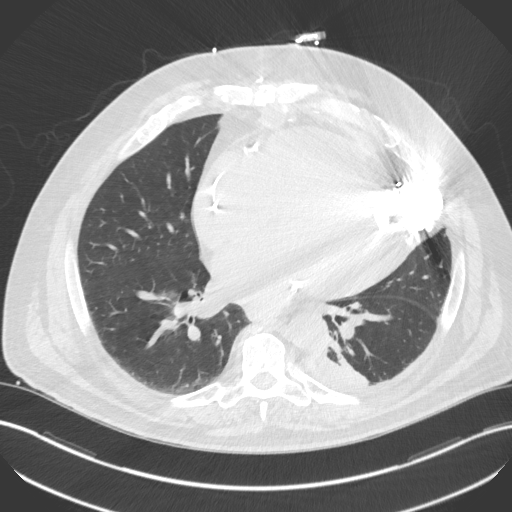
[im 75/144  lung]
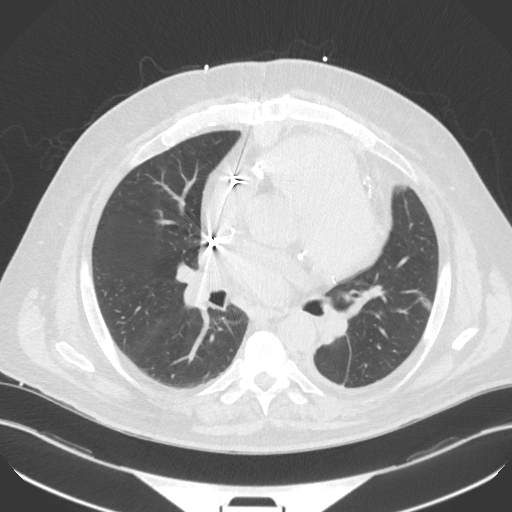
[im 85/144  lung]
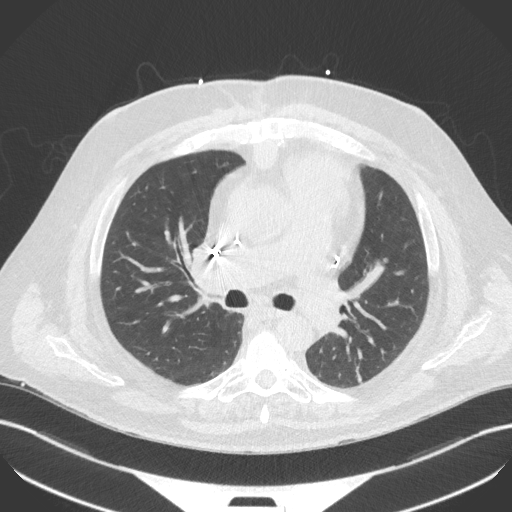
[im 96/144  lung]
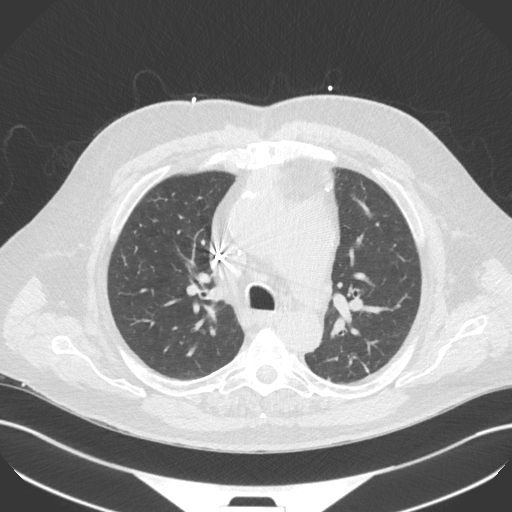
[im 112/144  mediastinal]
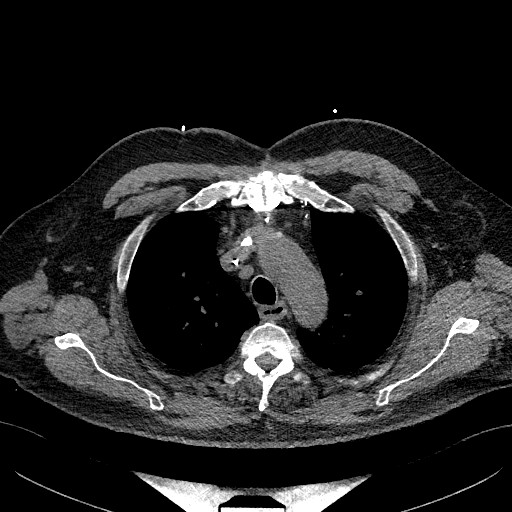
[im 112/144  lung]
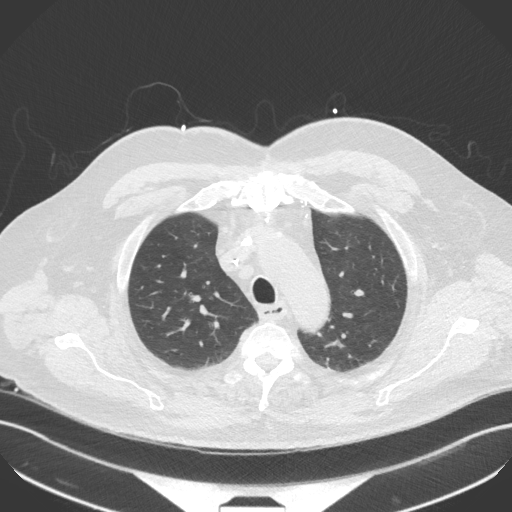
[im 122/144  lung]
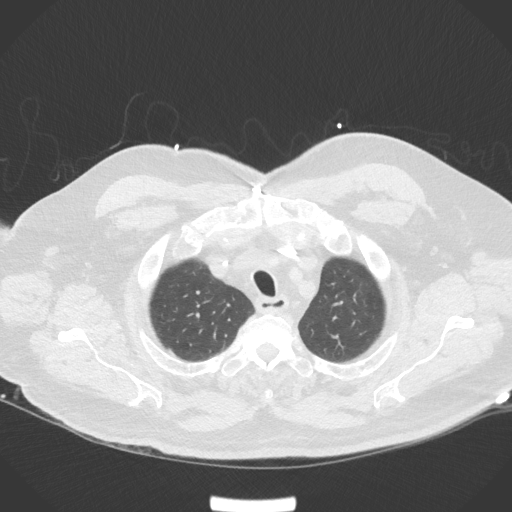
[im 133/144  lung]
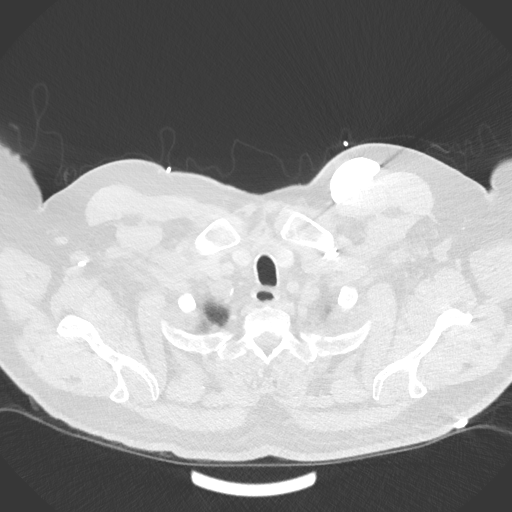

[Series 5: chest w/o 3mm st cor · coronal · non-contrast · 0.59mm/px · 3 of 108 slices shown]
[im 22/108  lung]
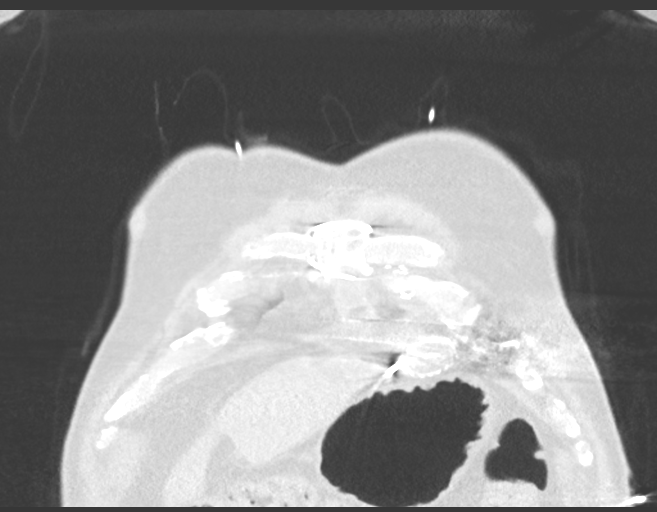
[im 43/108  lung]
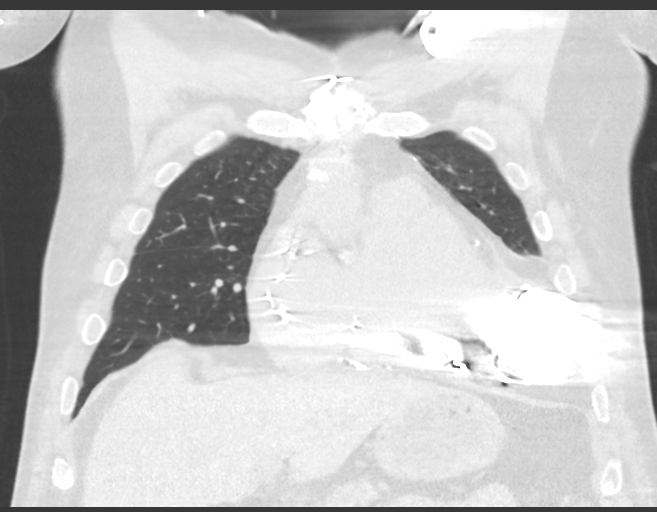
[im 65/108  lung]
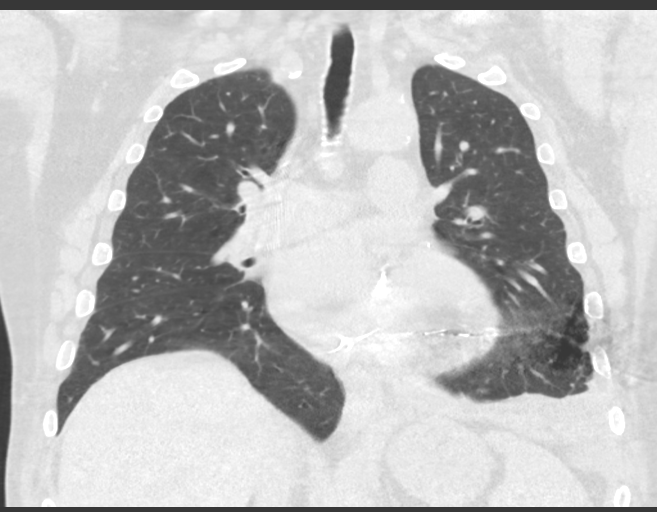

[14 of 36 positions shown; findings below may reference images not displayed]

FINDINGS: Cardiovascular: The heart is mildly enlarged. Diffuse coronary
artery calcifications are seen. Postoperative change is seen about
the mediastinum. An underlying pacemaker/AICD is noted.

There is aneurysmal dilatation of the ascending thoracic aorta to
4.6 cm in AP dimension. Scattered calcification is noted along the
aortic arch and proximal great vessels.

Mediastinum/Nodes: The patient is status post median sternotomy. A
mildly enlarged subcarinal node is seen, measuring 1.3 cm in short
axis. No additional mediastinal lymphadenopathy is seen. No
pericardial effusion is identified. A 1.7 cm hypodensity is noted at
the right thyroid lobe. No axillary lymphadenopathy is seen.

Lungs/Pleura: A small left pleural effusion demonstrates surrounding
pleural thickening, raising question for a small empyema. Associated
left basilar airspace opacity may reflect atelectasis or pneumonia.
Would correlate for any clinical evidence for empyema.

No pneumothorax is seen.  No dominant mass is identified.

Upper Abdomen: The visualized portions of the liver and spleen are
unremarkable. The visualized portions of the pancreas are within
normal limits.

Musculoskeletal: No acute osseous abnormalities are identified. The
visualized musculature is unremarkable in appearance. Nonspecific
minimally decreased density is noted within vertebral body T9,
stable from 7857.
IMPRESSION: 1. Small pleural effusion demonstrates surrounding pleural
thickening, raising question for a small empyema. Associated left
basilar airspace opacity may reflect atelectasis or pneumonia. Would
correlate clinically for any evidence of empyema.
2. Mild cardiomegaly.  Diffuse coronary artery calcifications seen.
3. Mildly enlarged 1.3 cm subcarinal node, of uncertain
significance.
4. Aneurysmal dilatation of the ascending thoracic aorta to 4.6 cm
in AP dimension. Recommend semi-annual imaging followup by CTA or
MRA and referral to cardiothoracic surgery if not already obtained.
This recommendation follows 1181
ACCF/AHA/AATS/ACR/ASA/SCA/MD DUDU/ABHAY/RONGISTO/AYABIRE Guidelines for the
Diagnosis and Management of Patients With Thoracic Aortic Disease.
Circulation. 1181; 121: [id number]
5. **An incidental finding of potential clinical significance has
been found. 1.7 cm hypodensity at the right thyroid lobe. Consider
further evaluation with thyroid ultrasound. If patient is clinically
hyperthyroid, consider nuclear medicine thyroid uptake and scan.**

## 2018-06-11 IMAGING — CR DG CHEST 1V PORT
1 series · 1 of 1 positions shown · non-contrast
Comparison: 12/01/2016

CLINICAL DATA: Chest pain and shortness of Breath

EXAM:
PORTABLE CHEST 1 VIEW

[AP]
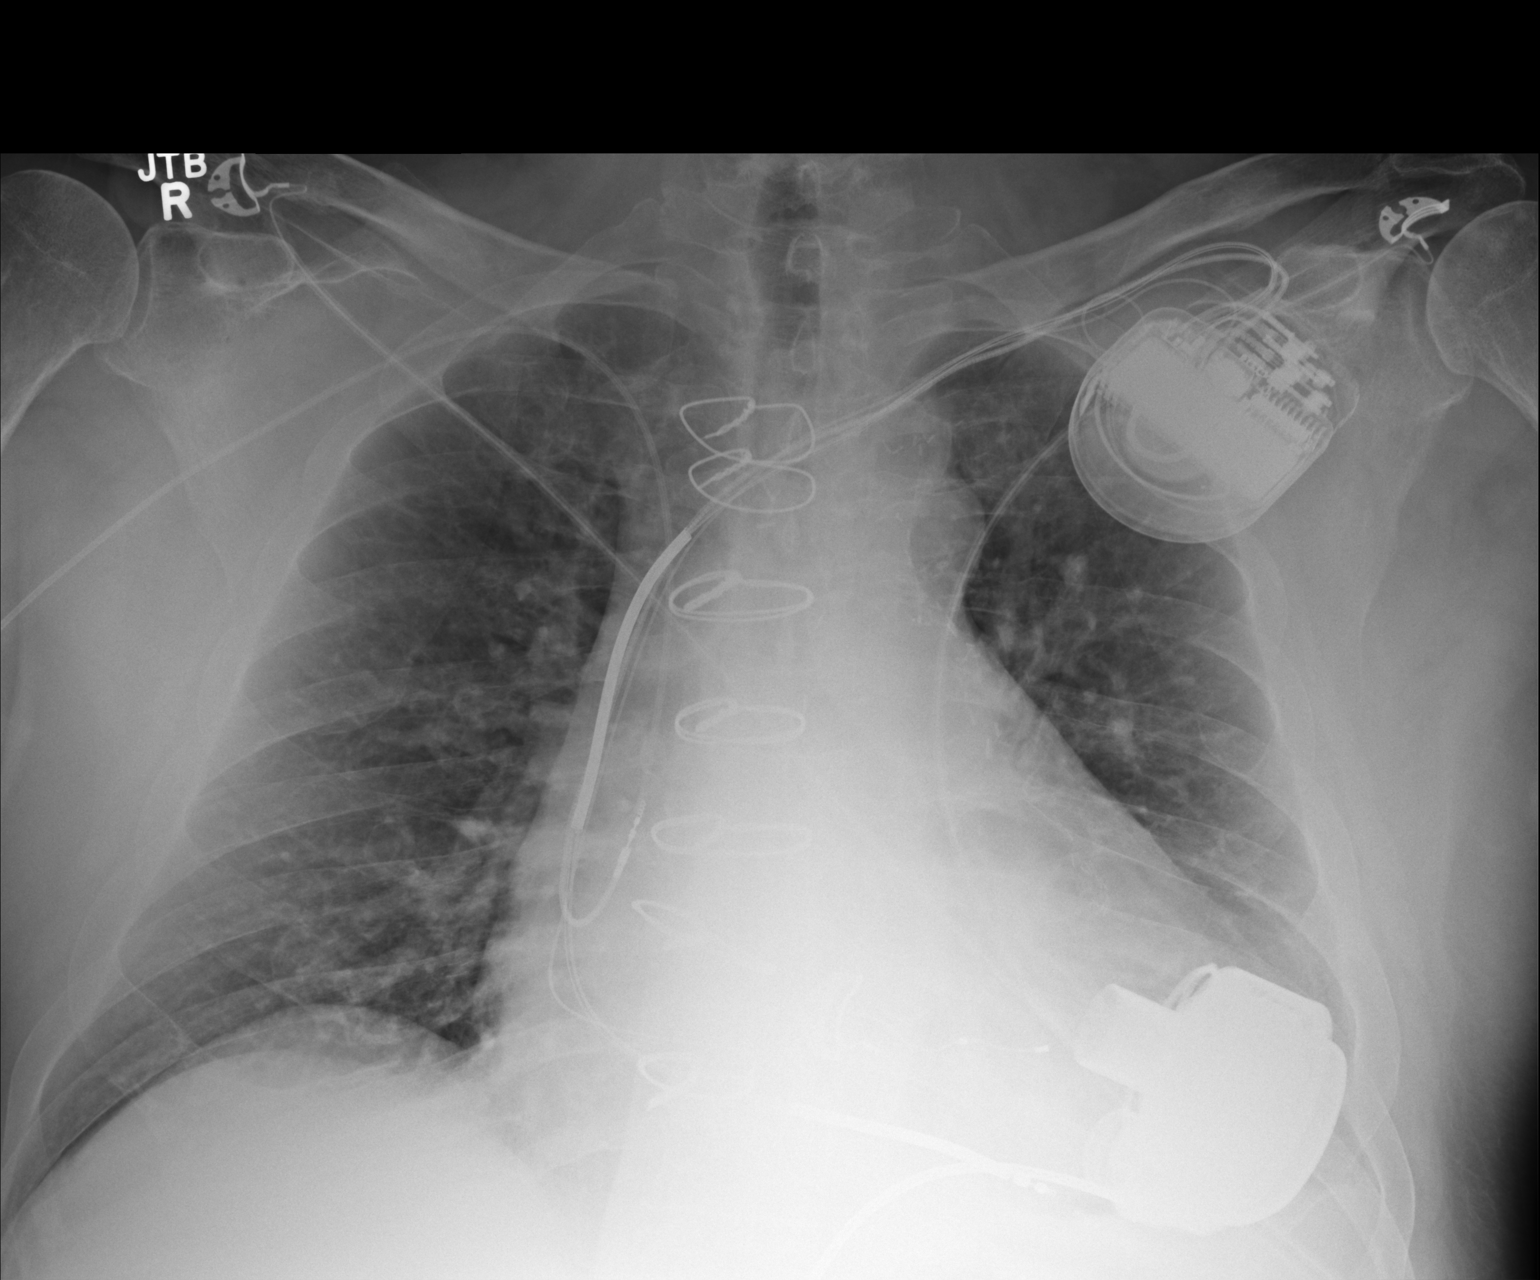

[1 of 1 positions shown; findings below may reference images not displayed]

FINDINGS: Cardiac shadow is enlarged. Defibrillator and LVAD device are noted
and stable. Right-sided PICC line is noted at the cavoatrial
junction in satisfactory position. No focal infiltrate is noted.
IMPRESSION: No acute abnormality noted.

## 2018-06-12 IMAGING — CR DG ABD PORTABLE 1V
2 series · 2 of 2 positions shown · non-contrast
Comparison: 02/24/2016

CLINICAL DATA: Emesis.

EXAM:
PORTABLE ABDOMEN - 1 VIEW

[AP (1 of 2)]
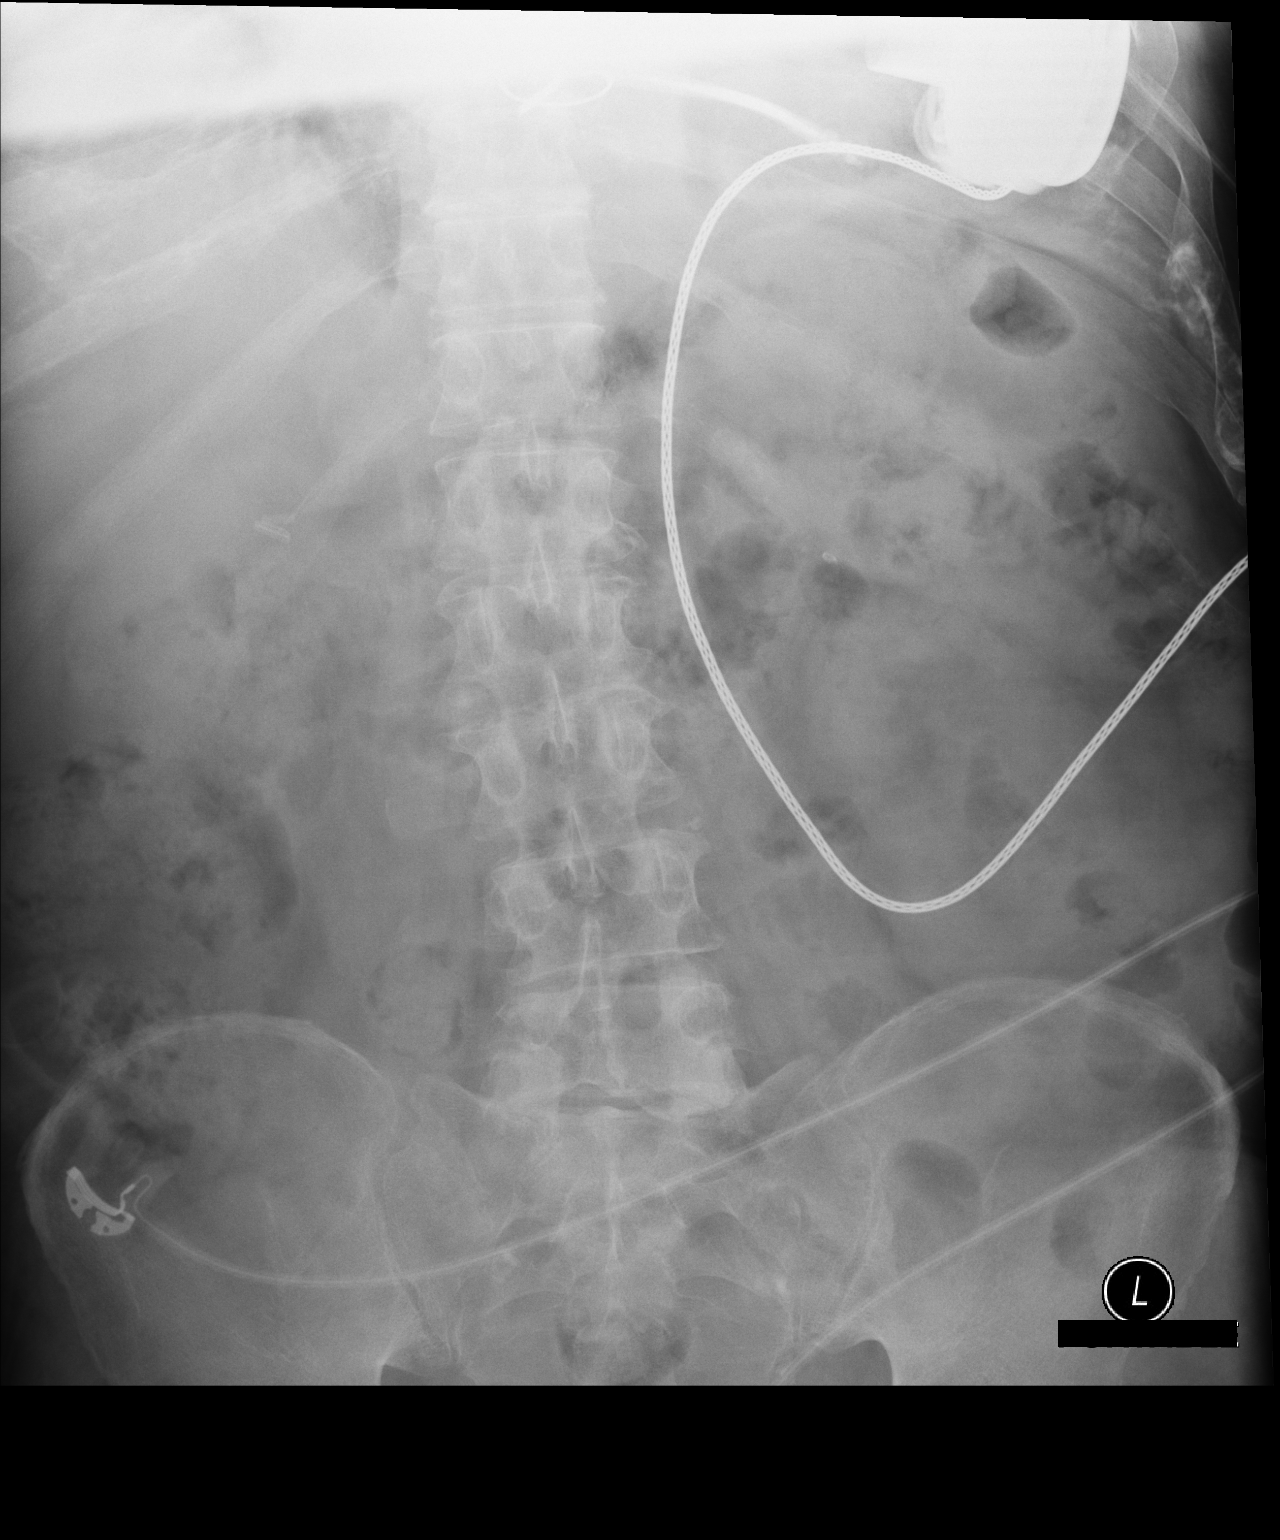

[AP (2 of 2)]
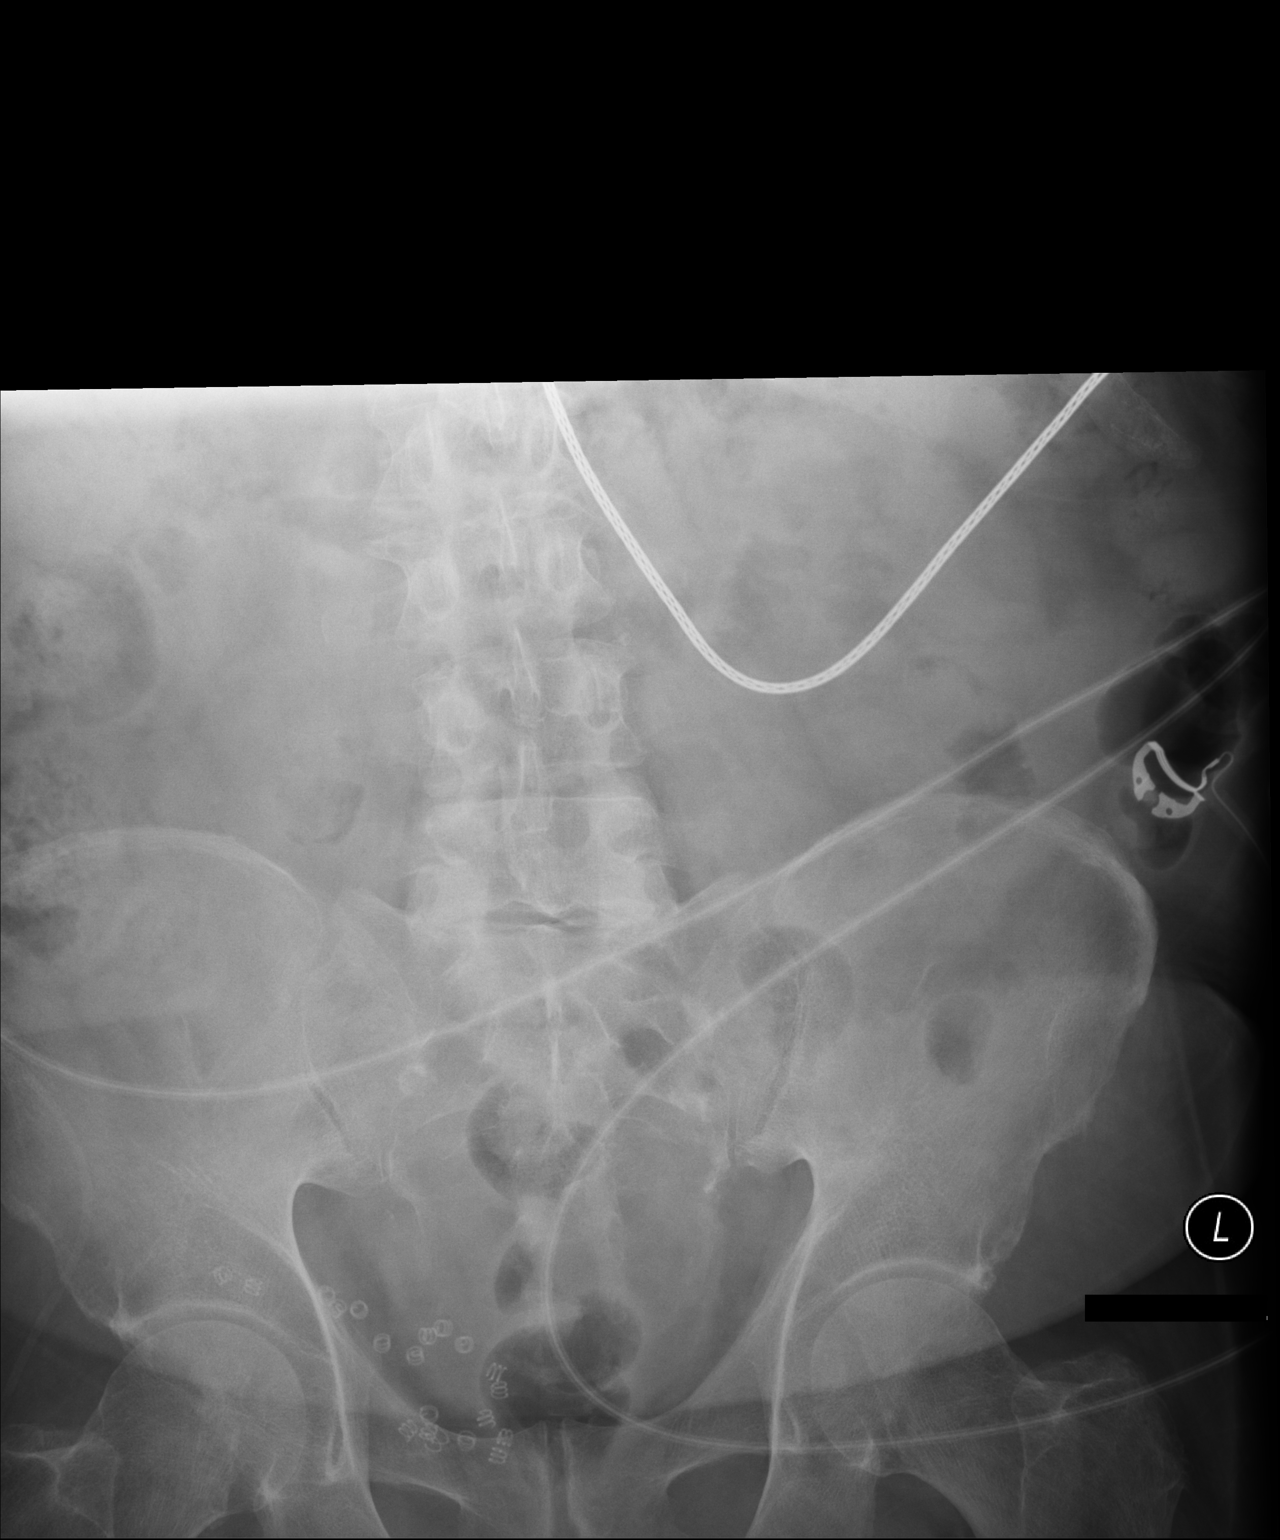

[2 of 2 positions shown; findings below may reference images not displayed]

FINDINGS: Gas and stool are present in nondilated colon. There is scattered
gas in nondistended small bowel without evidence of obstruction. No
gross intraperitoneal free air is identified on this supine study. A
left ventricular assist device is partially visualized, and sequelae
of prior right inguinal hernia repair and cholecystectomy are noted.
There is mild multilevel lumbar disc degeneration.
IMPRESSION: Nonobstructed bowel gas pattern.

## 2018-06-13 IMAGING — CR DG CHEST 1V PORT
1 series · 1 of 1 positions shown · non-contrast
Comparison: Stop

CLINICAL DATA: PICC line placement

EXAM:
PORTABLE CHEST 1 VIEW

[AP]
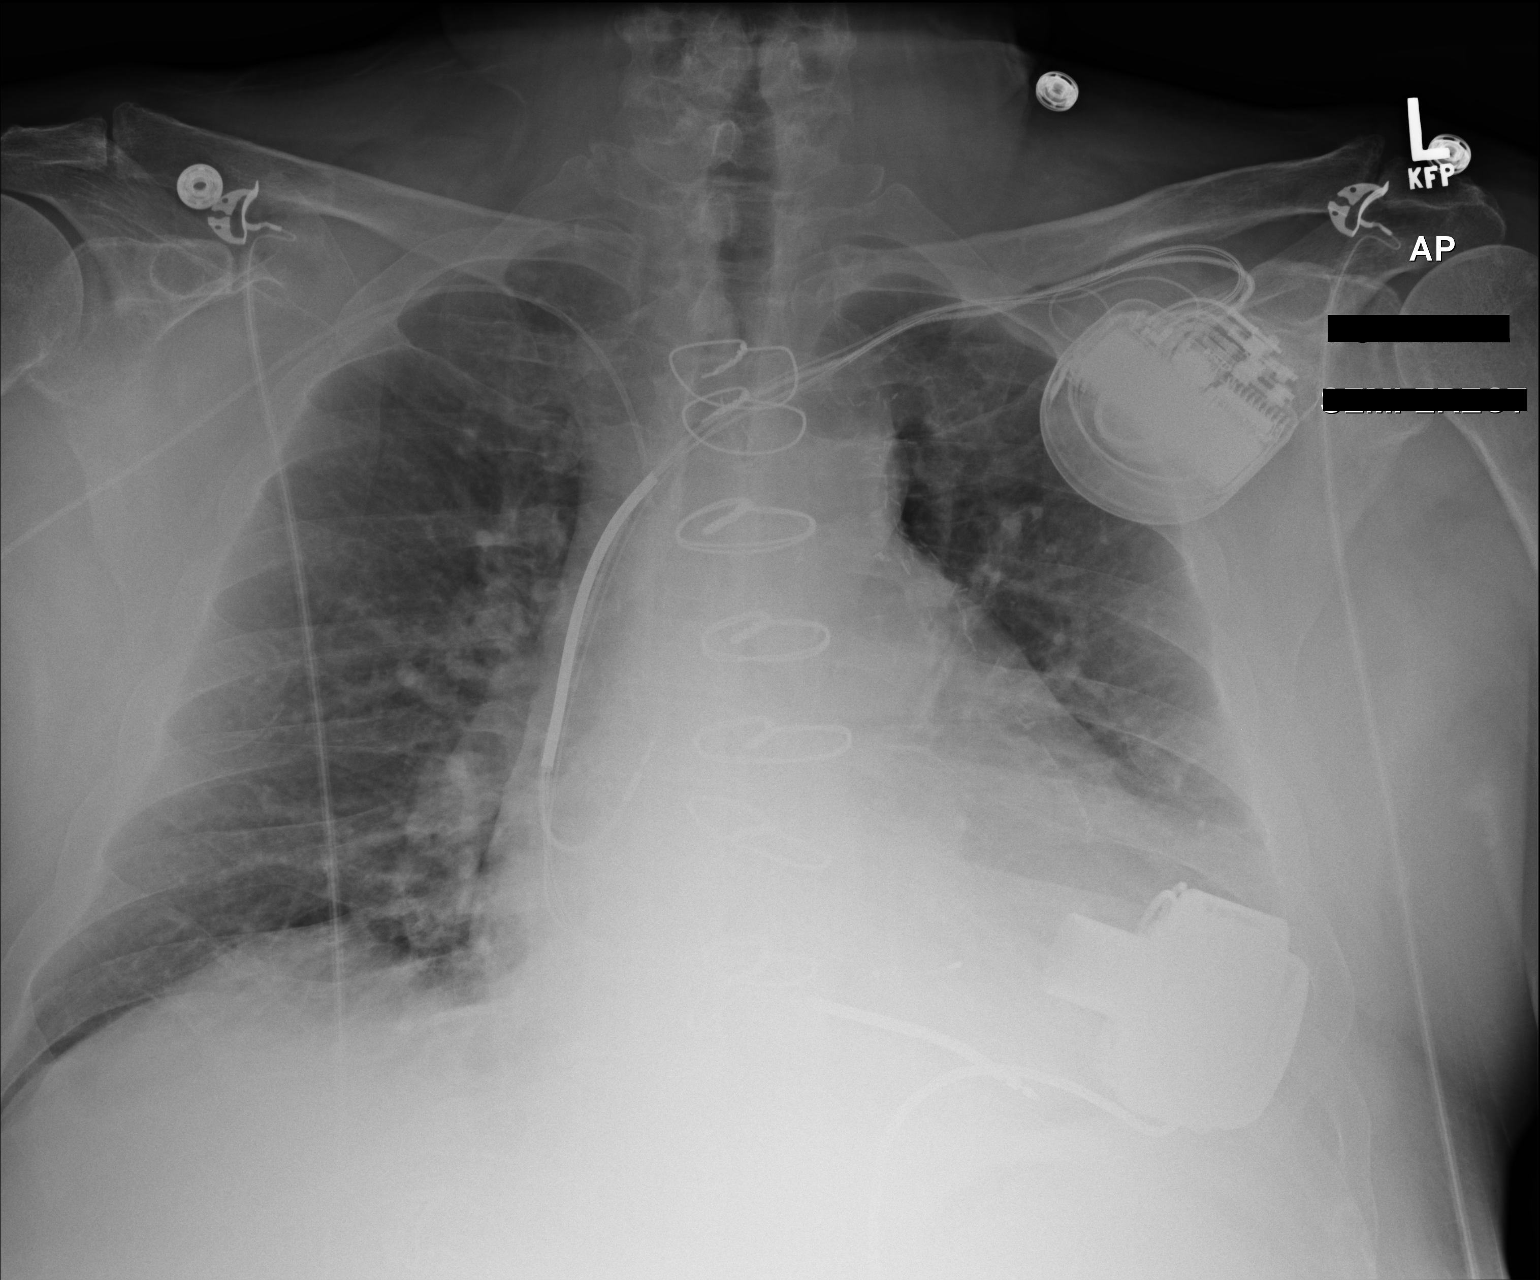

[1 of 1 positions shown; findings below may reference images not displayed]

FINDINGS: The tip of a PICC line from a right-sided approach terminates in the
distal SVC-RA juncture. Heart is enlarged. ICD device projects over
the left shoulder with leads in the right atrium and right
ventricle. A left ventricular assisting device projects over the
apex cardiac shadow. The patient is status post CABG. Aortic
atherosclerosis without aneurysm identified. Minimal bibasilar
atelectasis. No pneumonic consolidation or CHF. No acute osseous
appearing abnormality.
IMPRESSION: Right-sided PICC line tip is seen at the cavoatrial junction. No
acute pulmonary disease.

## 2018-06-18 IMAGING — US IR US GUIDE VASC ACCESS RIGHT
1 series · 2 of 2 positions shown · non-contrast
Comparison: none

INDICATION: 61-year-old male with a history of heart failure.

[Series 1: ir fluoro/shunt/fist · 2 of 2 slices shown]
[im 1/2]
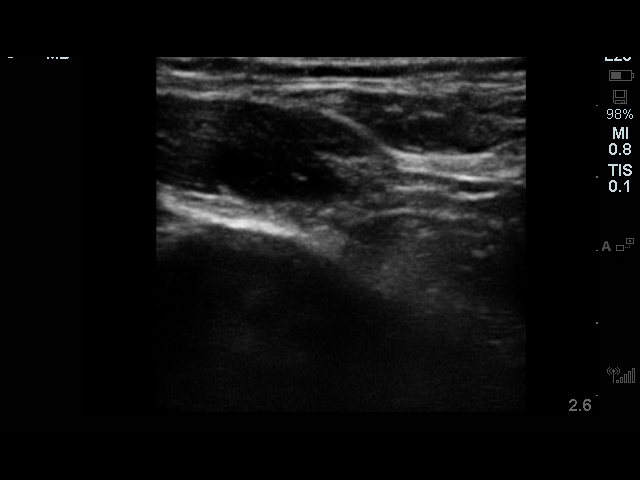
[im 2/2]
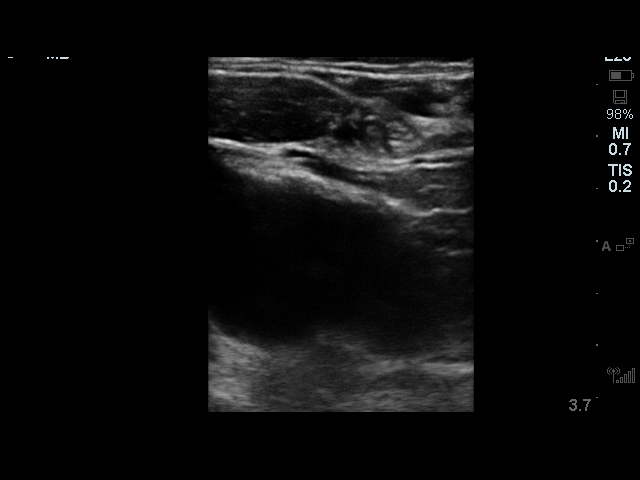

[2 of 2 positions shown; findings below may reference images not displayed]

EXAM:
PICC LINE PLACEMENT WITH ULTRASOUND AND FLUOROSCOPIC GUIDANCE

MEDICATIONS:
None

ANESTHESIA/SEDATION:
None

FLUOROSCOPY TIME:  Fluoroscopy Time: 0 minutes 6 seconds (0.7 mGy).

COMPLICATIONS:
None

PROCEDURE:
After written informed consent was obtained, patient was placed in
the supine position on angiographic table.

Patency of the right internal jugular vein was confirmed with
ultrasound with image documentation.

Patient was prepped and draped in the usual sterile fashion
including the right neck and right superior chest.

Using ultrasound guidance, the skin and subcutaneous tissues
overlying the right internal jugular vein were generously
infiltrated with 1% lidocaine without epinephrine. Using ultrasound
guidance, the right internal jugular vein was punctured with a
micropuncture needle, and an 018 wire was advanced into the right
heart confirming venous access. A small stab incision was made with
an 11 blade scalpel.

Peel-away sheath was placed over the wire, and then the wire was
removed, marking the wire for estimation of internal catheter
length.

The chest wall was then generously infiltrated with 1% lidocaine for
local anesthesia along the tissue tract. Small stab incision was
made with 11 blade scalpel, and then the catheter was back tunneled
to the puncture site at the right internal jugular vein.

Catheter was pulled through the tract, with the catheter amputated
at 22 cm.

Catheter was advanced through the peel-away sheath, and the
peel-away sheath was removed.

Final image was stored.

The catheter was anchored to the chest wall with retention sutures,
and Derma bond was used to seal the right internal jugular vein
incision site and at the right chest wall.

Patient tolerated the procedure well and remained hemodynamically
stable throughout.

No complications were encountered and no significant blood loss was
encountered.
IMPRESSION: Status post right IJ tunneled catheter placement. Catheter ready for
use.

## 2018-07-23 IMAGING — DX DG CHEST 1V PORT
2 series · 2 of 2 positions shown · non-contrast
Comparison: 01/23/2017

CLINICAL DATA: Shortness of Breath

EXAM:
PORTABLE CHEST 1 VIEW

[chest ap (1 of 2)]
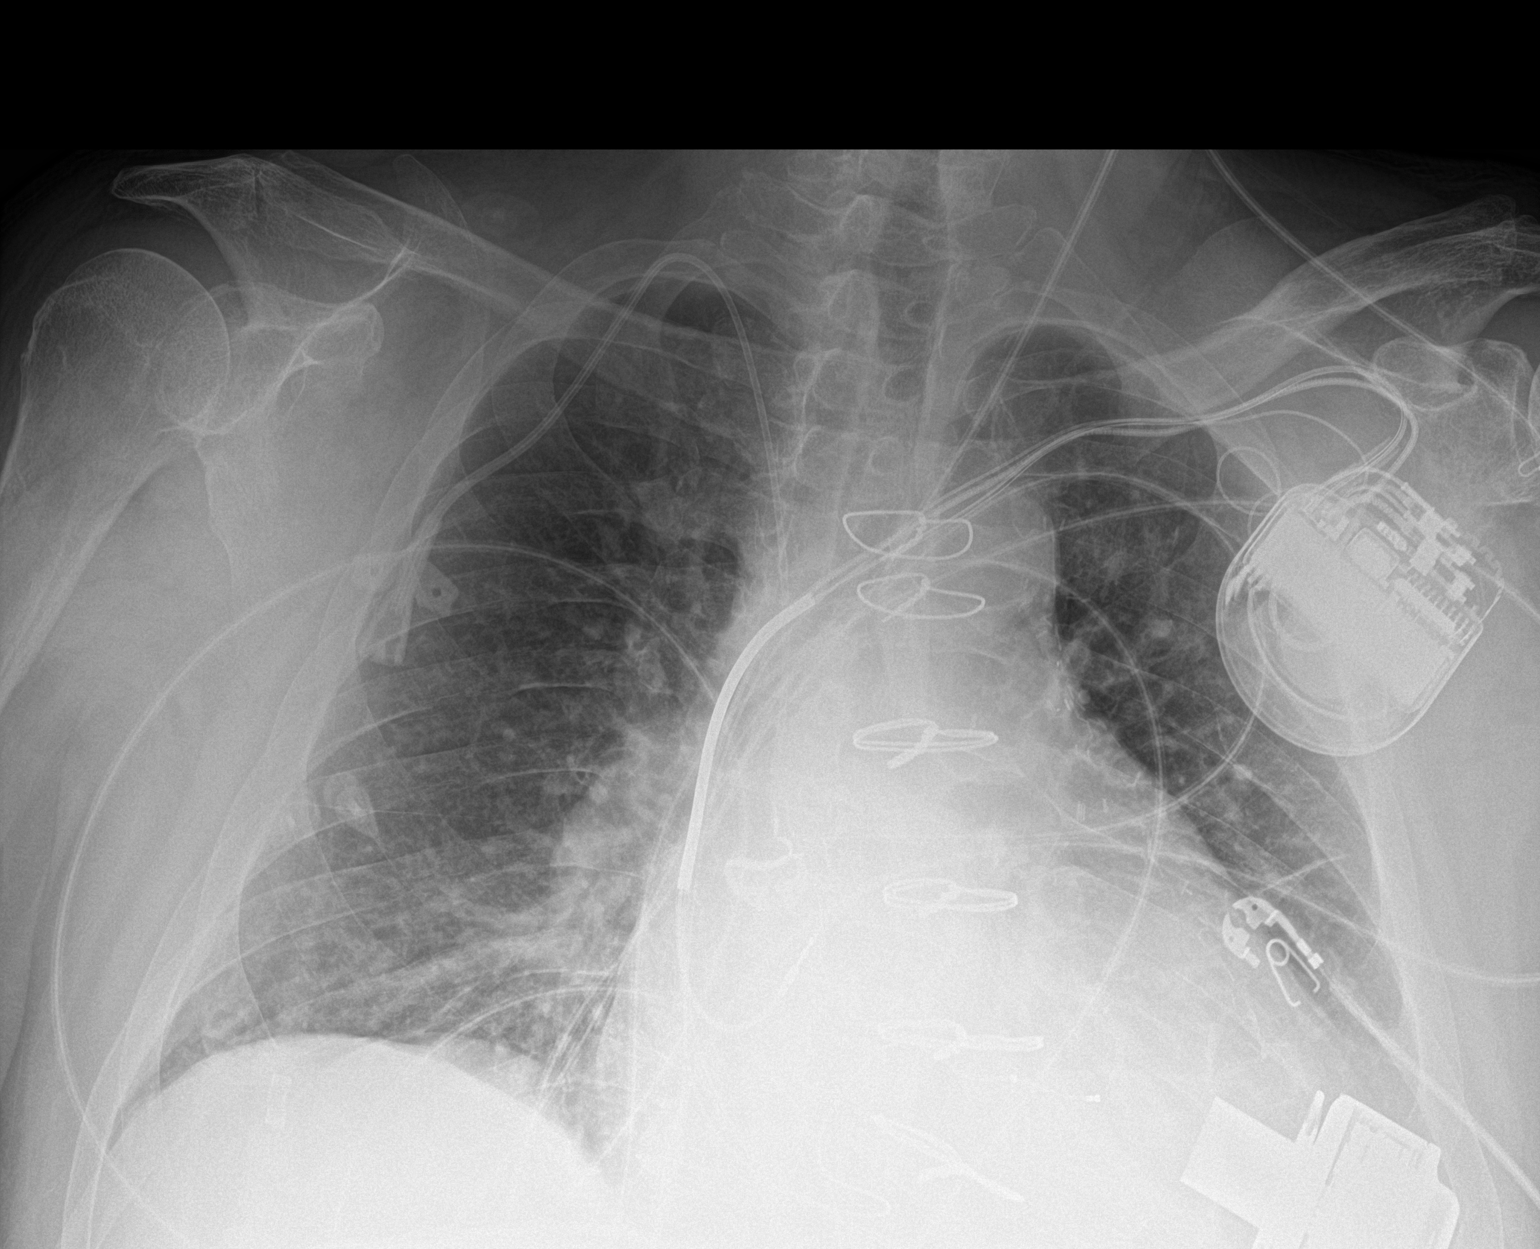

[chest ap (2 of 2)]
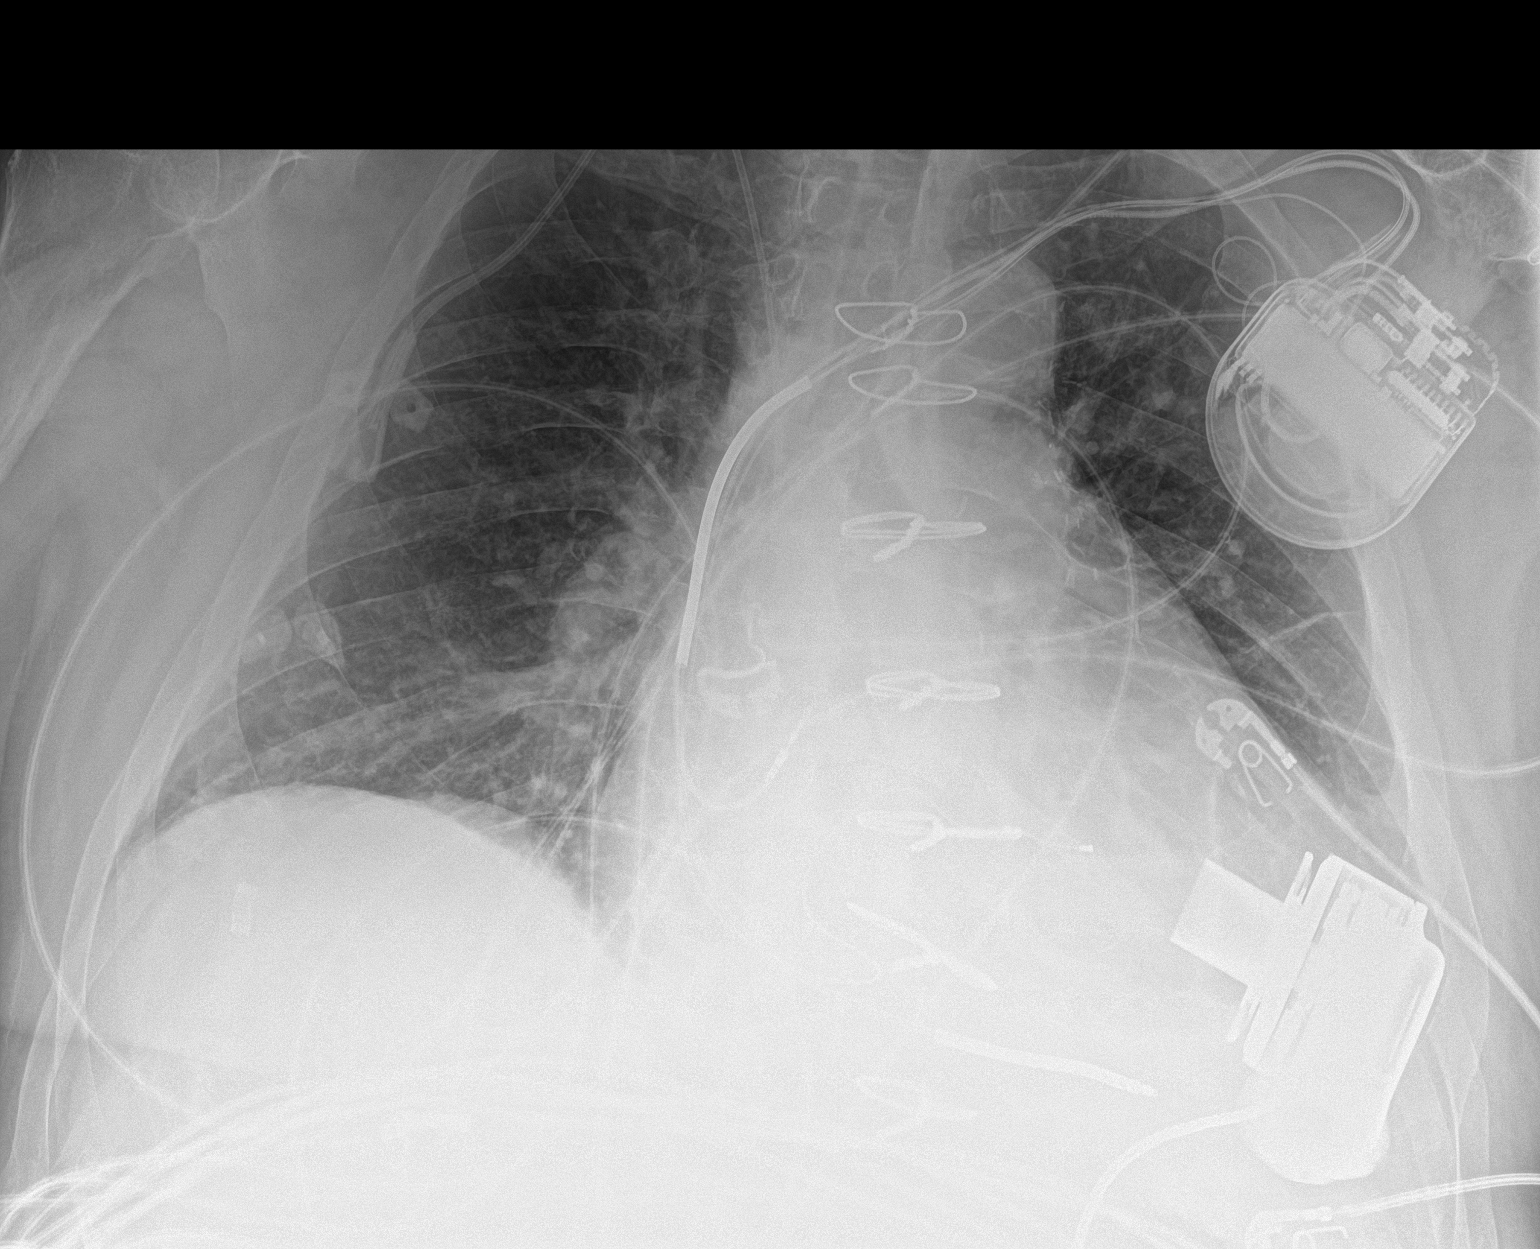

[2 of 2 positions shown; findings below may reference images not displayed]

FINDINGS: Cardiac shadow is enlarged. LVAD is again seen. Defibrillator is
noted. Tunneled right jugular PICC line is seen in satisfactory
position. No focal infiltrate or sizable effusion is seen.
IMPRESSION: Tubes and lines as described.  No acute abnormality is noted.

## 2018-08-29 IMAGING — DX DG CHEST 1V PORT
1 series · 1 of 1 positions shown · non-contrast
Comparison: Chest radiograph March 04, 2017 and CT chest March 07, 2017

CLINICAL DATA: Shortness of breath. History of hypertension,
defibrillator.

EXAM:
PORTABLE CHEST 1 VIEW

[chest ap]
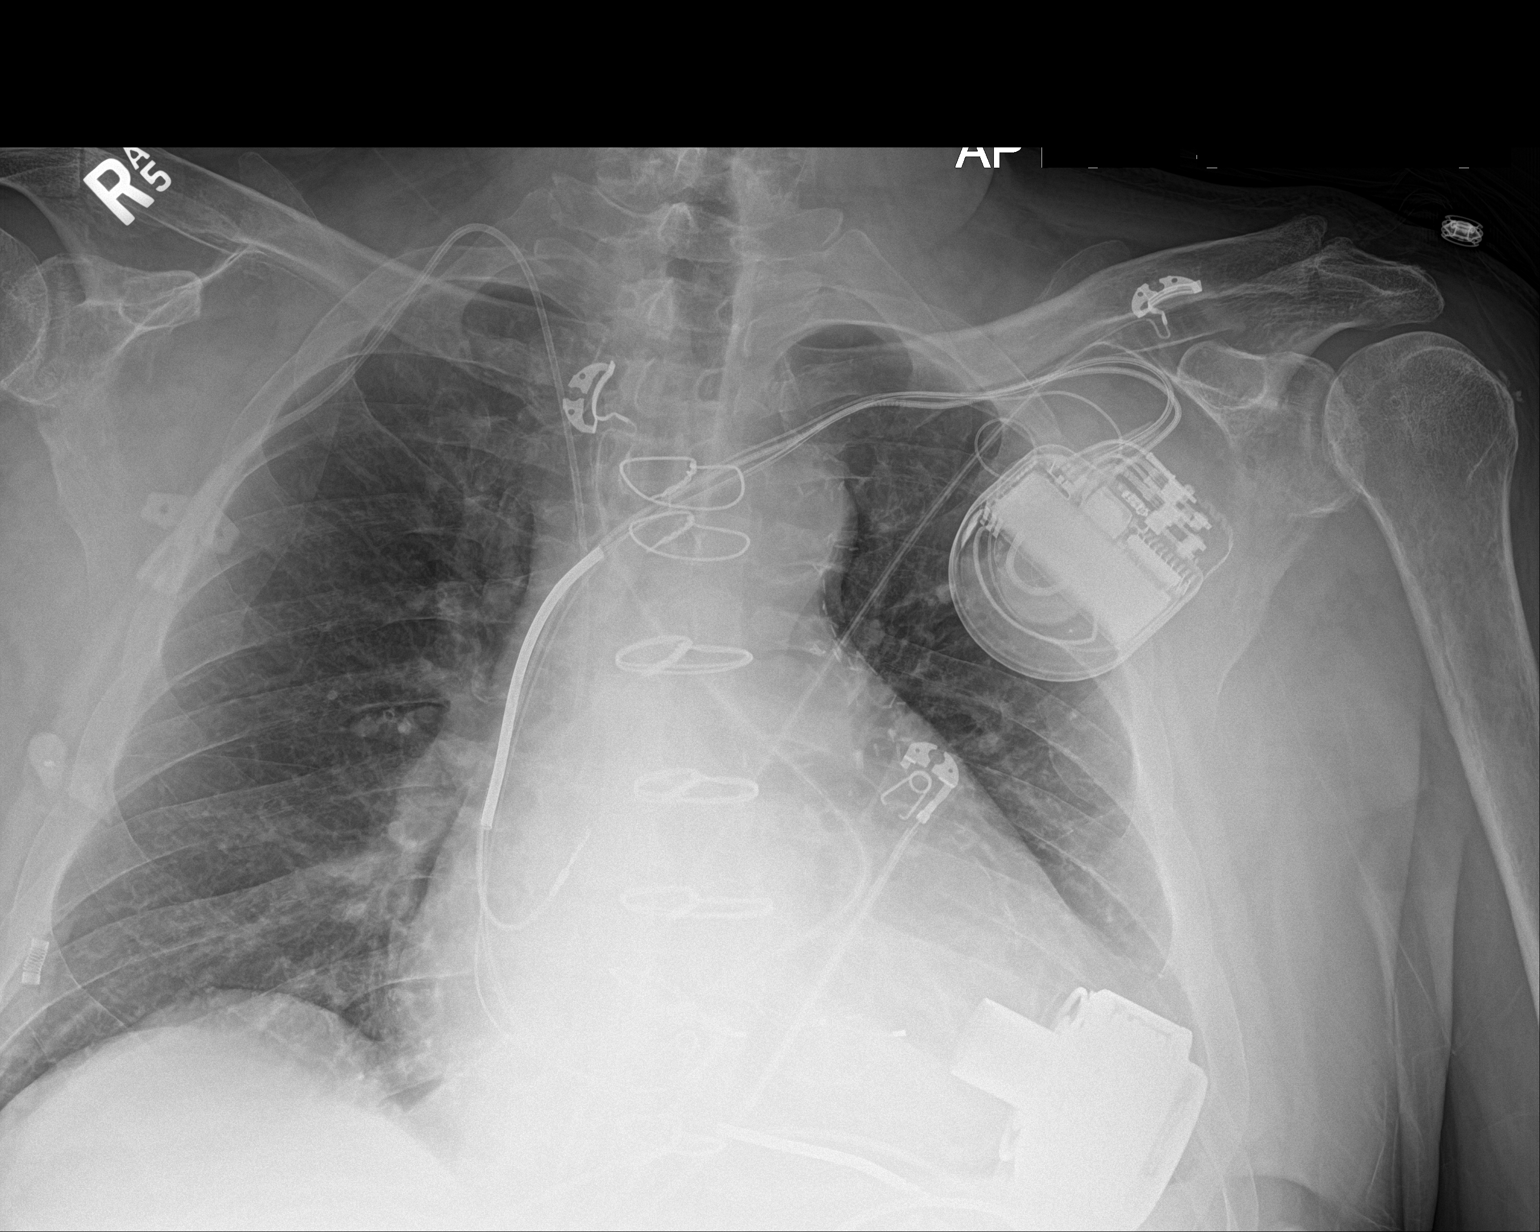

[1 of 1 positions shown; findings below may reference images not displayed]

FINDINGS: Stable moderate cardiomegaly, status post median sternotomy for
CABG. LEFT ventricular assist device similarly positioned. Re-
demonstration of LEFT cardiac defibrillator and RIGHT central venous
catheter with distal tip projecting in mid superior vena cava. No
pleural effusion or focal consolidation. Pulmonary vasculature
appears normal. No pneumothorax. Soft tissue planes and included
osseous structures are unchanged.
IMPRESSION: Stable cardiomegaly without acute pulmonary process.

Stable life-support lines.

## 2018-08-29 IMAGING — CT CT HEAD W/O CM
4 series · 15 of 47 positions shown, 17 images · non-contrast
Comparison: CT HEAD January 29, 2017

CLINICAL DATA: Confusion, anticoagulated. History of diabetes,
defibrillator, substance abuse.

EXAM:
CT HEAD WITHOUT CONTRAST
TECHNIQUE: Contiguous axial images were obtained from the base of the skull
through the vertex without intravenous contrast.

[Series 3: head without · axial · non-contrast · 0.45mm/px · z∈[-152,-32]mm · 7 of 34 slices shown, 9 images]
[im 5/34  brain]
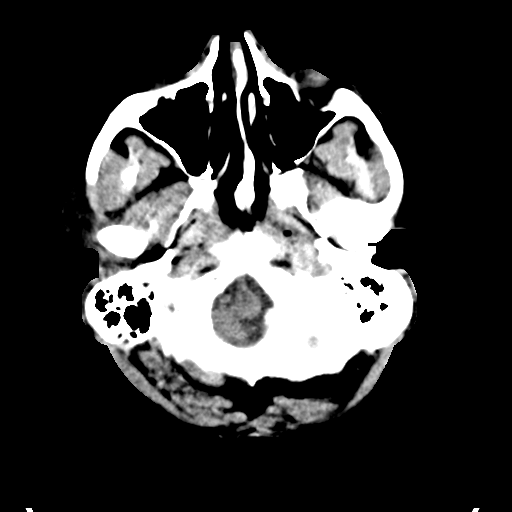
[im 5/34  bone]
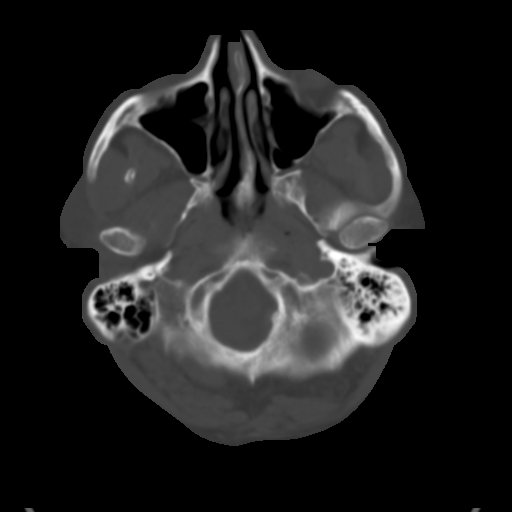
[im 9/34  brain]
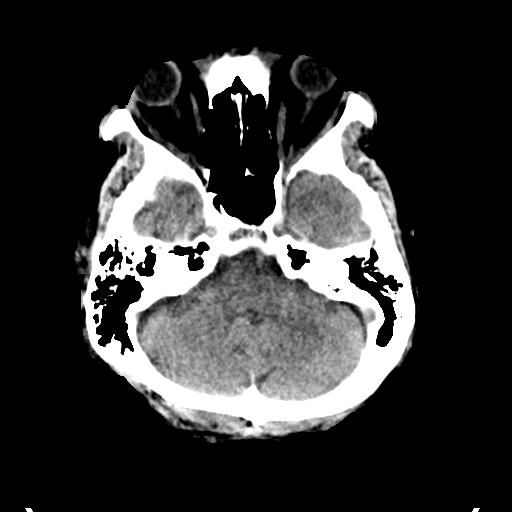
[im 13/34  brain]
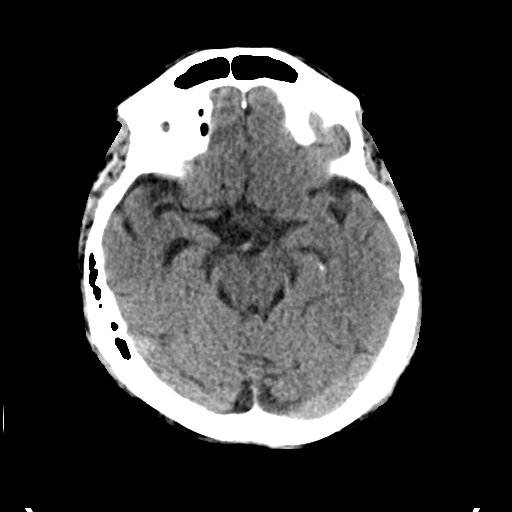
[im 17/34  brain]
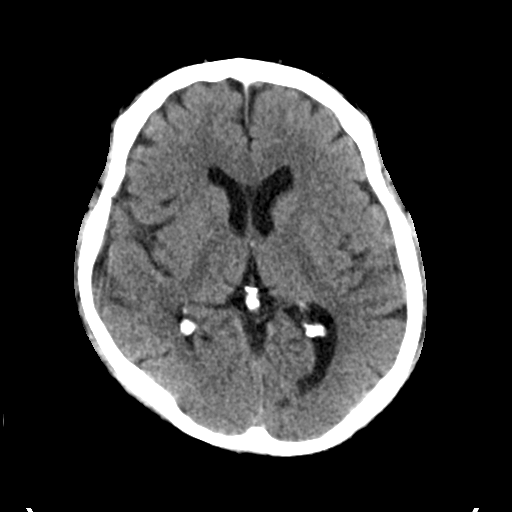
[im 21/34  brain]
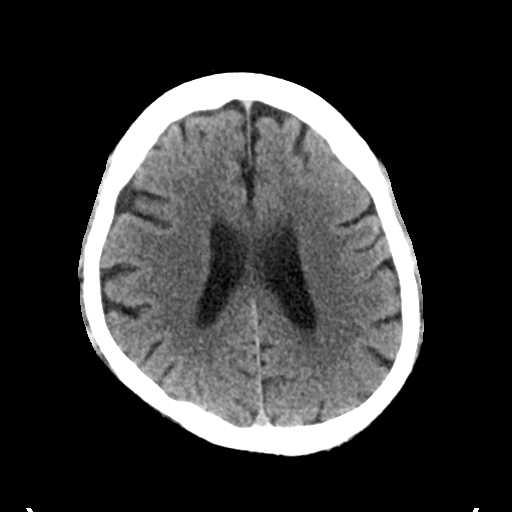
[im 21/34  bone]
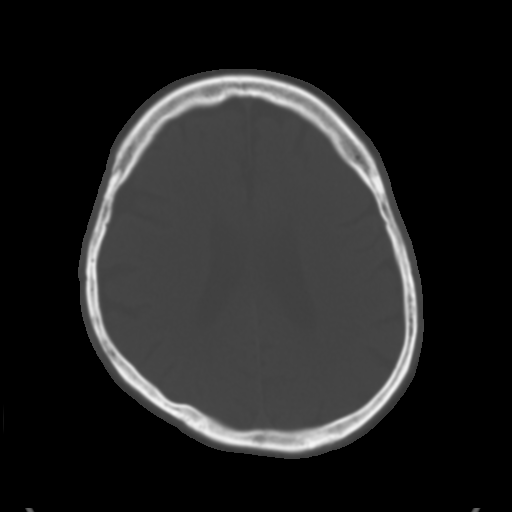
[im 25/34  brain]
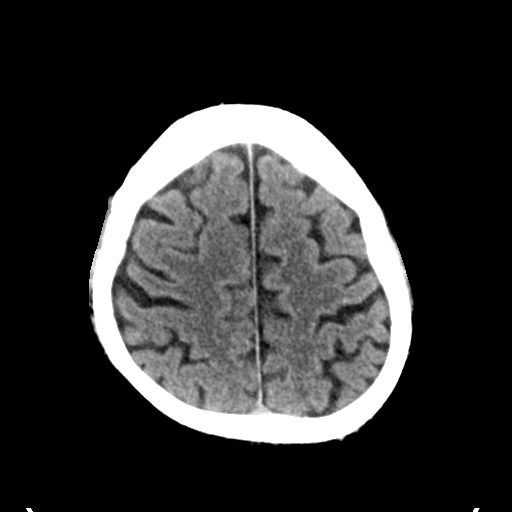
[im 29/34  brain]
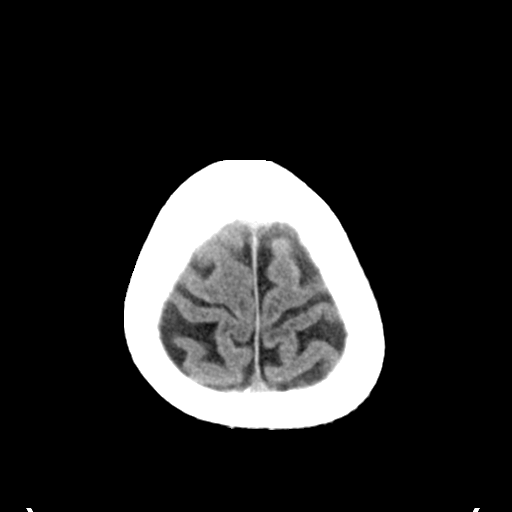

[Series 4: head bone · axial · 0.45mm/px · z∈[-156,-140]mm · 2 of 84 slices shown]
[im 9/84  bone]
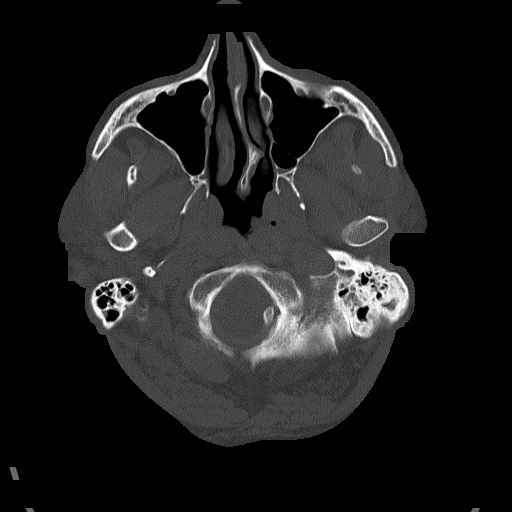
[im 17/84  bone]
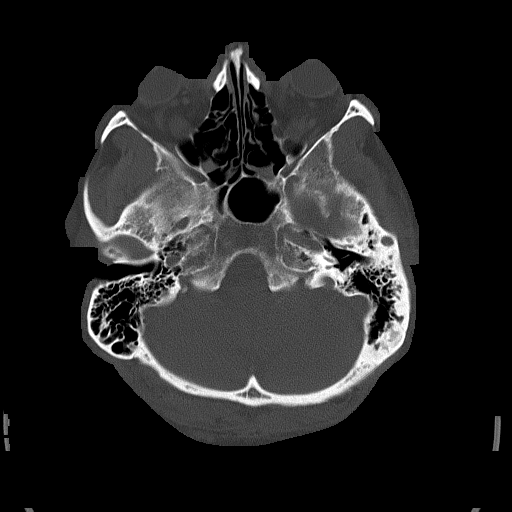

[Series 5: head without cor · coronal · non-contrast · 0.33mm/px · 3 of 71 slices shown]
[im 24/71  brain]
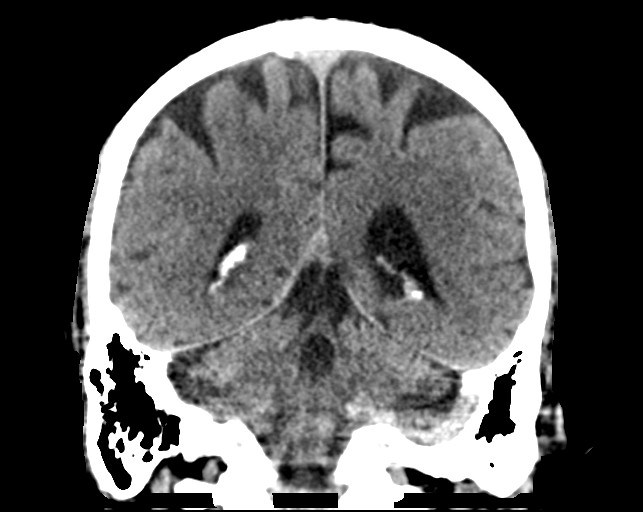
[im 32/71  brain]
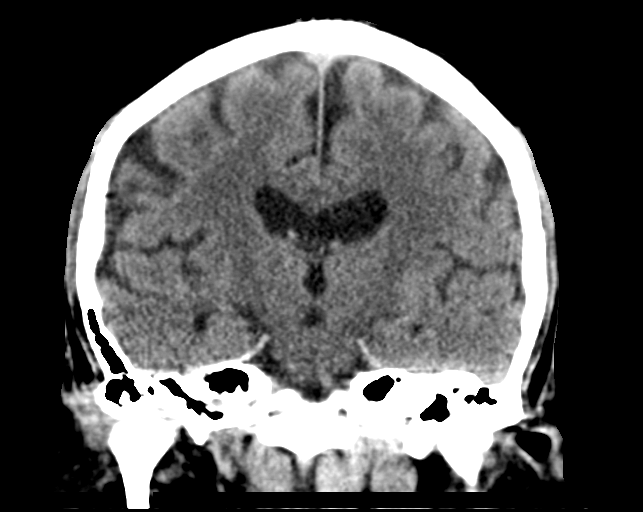
[im 39/71  brain]
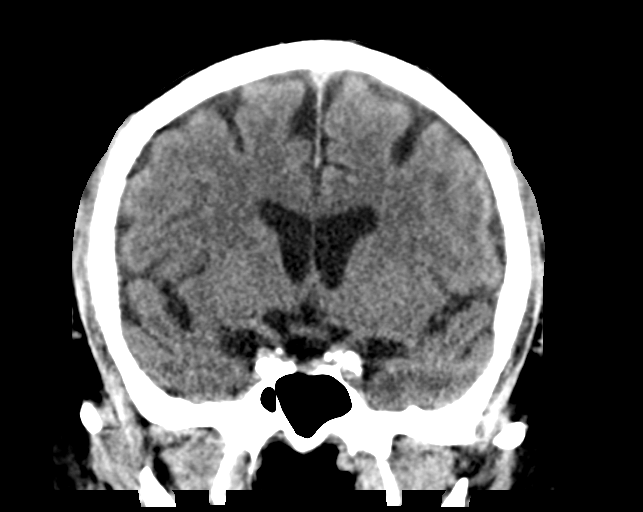

[Series 6: head without sag · sagittal · non-contrast · 0.33mm/px · 3 of 67 slices shown]
[im 23/67  brain]
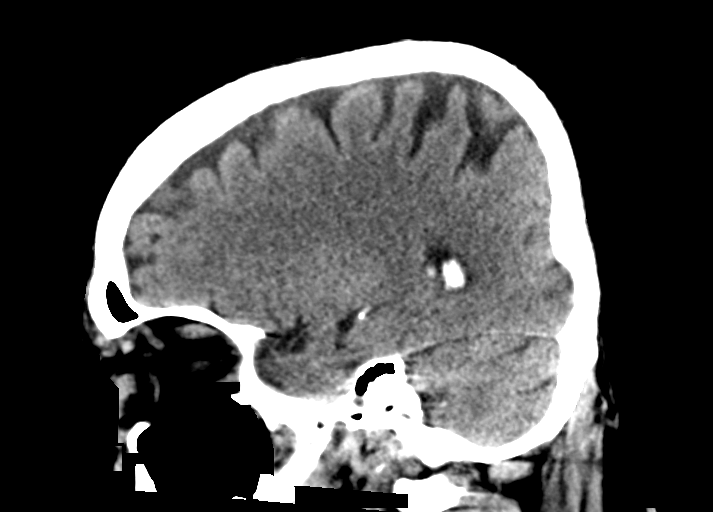
[im 34/67  brain]
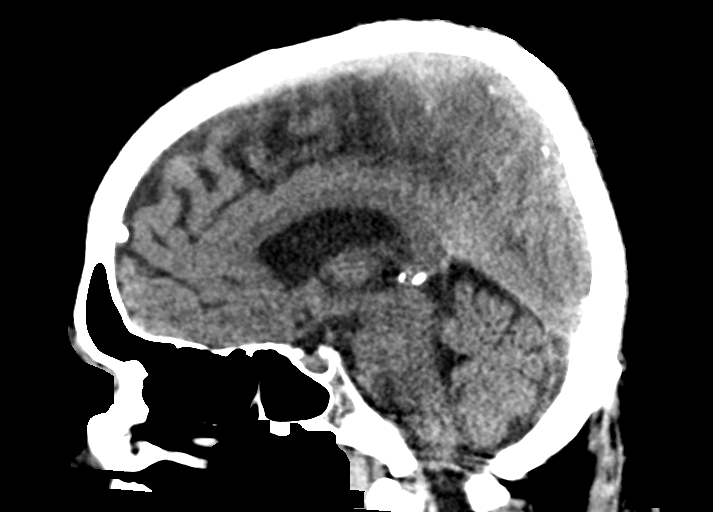
[im 45/67  brain]
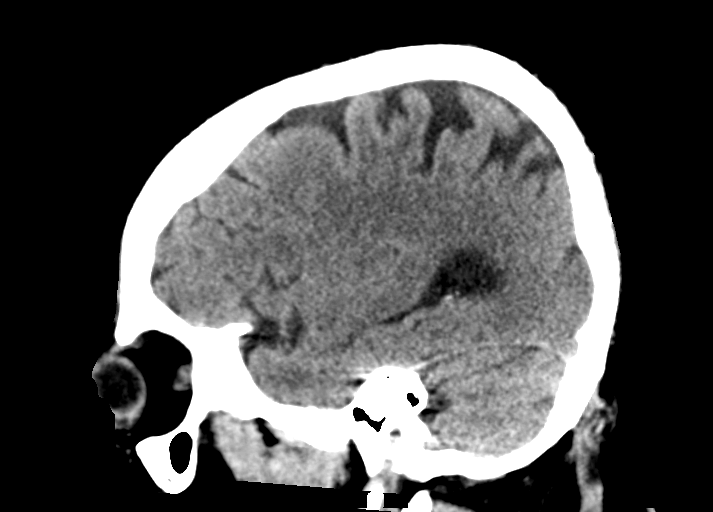

[15 of 47 positions shown; findings below may reference images not displayed]

FINDINGS: BRAIN: No intraparenchymal hemorrhage, mass effect nor midline
shift. The ventricles and sulci are normal for age. Patchy
supratentorial white matter hypodensities within normal range for
patient's age, though non-specific are most compatible with chronic
small vessel ischemic disease. No acute large vascular territory
infarcts. No abnormal extra-axial fluid collections. Basal cisterns
are patent.

VASCULAR: Moderate to severe calcific atherosclerosis of the carotid
siphons.

SKULL: No skull fracture. No significant scalp soft tissue swelling.

SINUSES/ORBITS: Mild paranasal sinus mucosal thickening, LEFT
maxillary mucosal retention cyst. Stable bilateral exophthalmos.
Symmetrically prominent Peirce for the anterior ethmoidal artery's.

OTHER: None.
IMPRESSION: 1. No acute intracranial process.
2. Moderate to severe atherosclerosis; otherwise negative
noncontrast CT HEAD for age.

## 2018-09-09 IMAGING — US US ABDOMEN LIMITED
1 series · 8 of 8 positions shown · non-contrast
Comparison: None.

CLINICAL DATA: Evaluate for the presence of ascites

EXAM:
LIMITED ABDOMEN ULTRASOUND FOR ASCITES
TECHNIQUE: Limited ultrasound survey for ascites was performed in all four
abdominal quadrants.

[Series 1: us abdomen limited · 0.28mm/px · 8 of 8 slices shown]
[im 1/8]
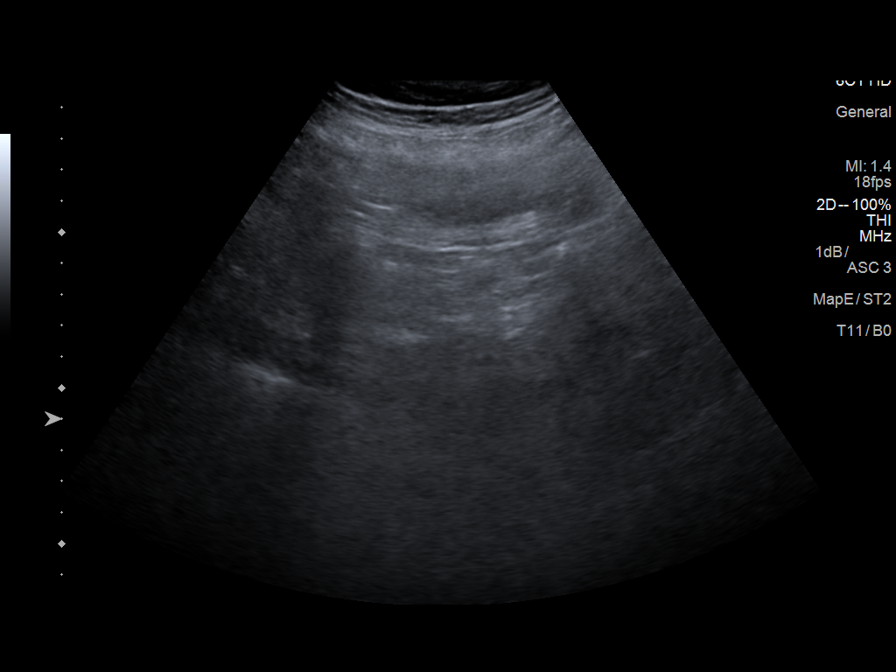
[im 2/8]
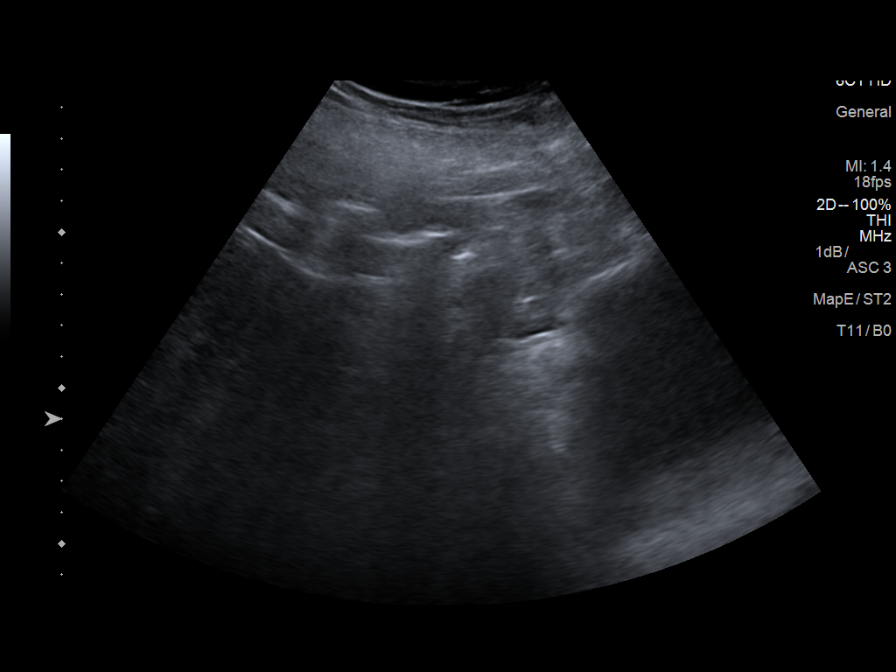
[im 3/8]
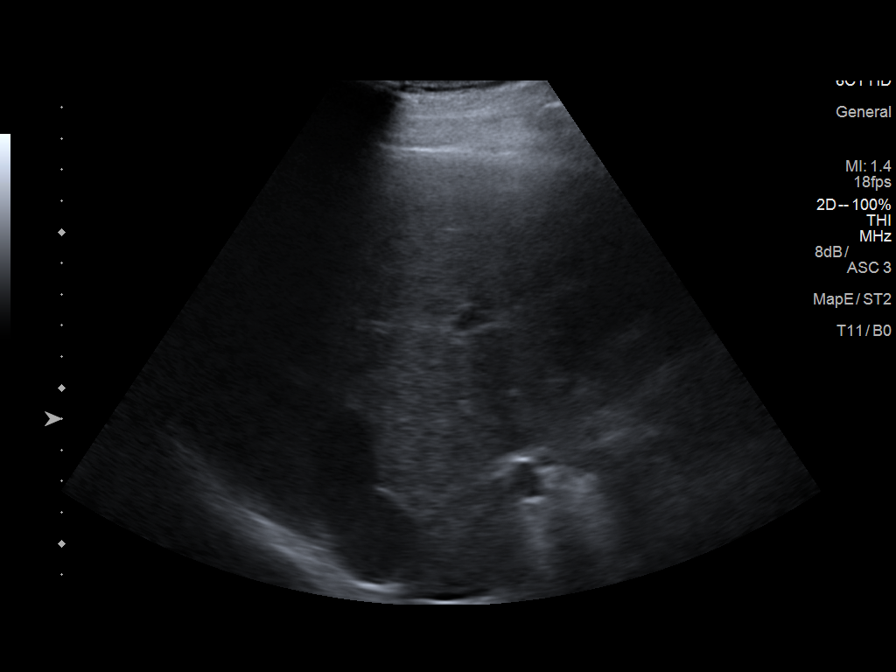
[im 4/8]
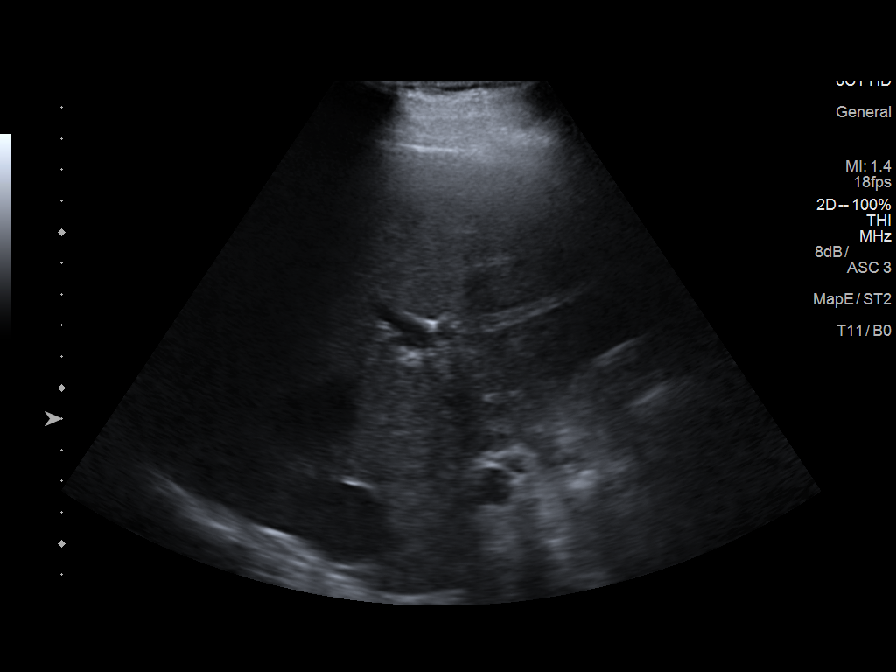
[im 5/8]
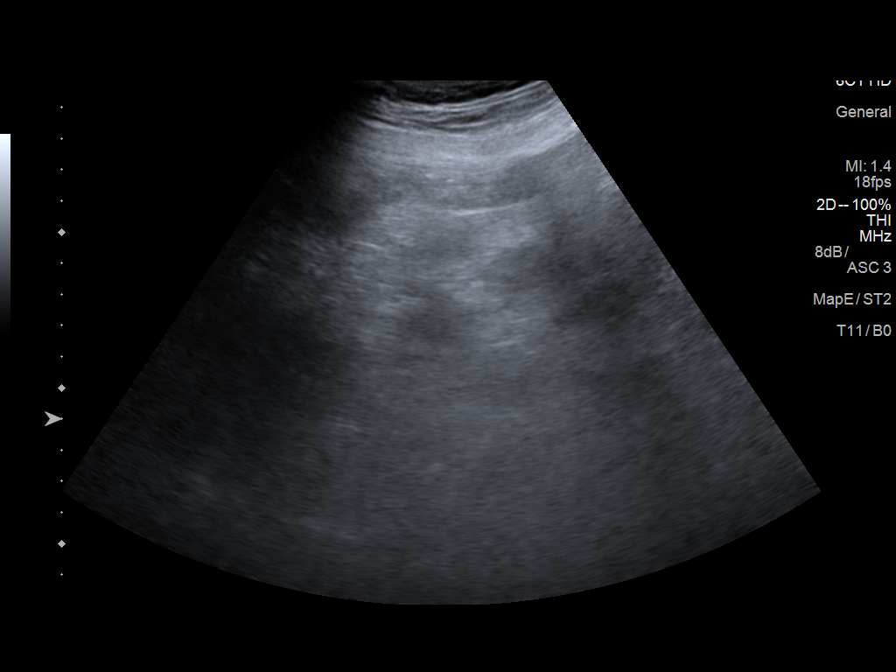
[im 6/8]
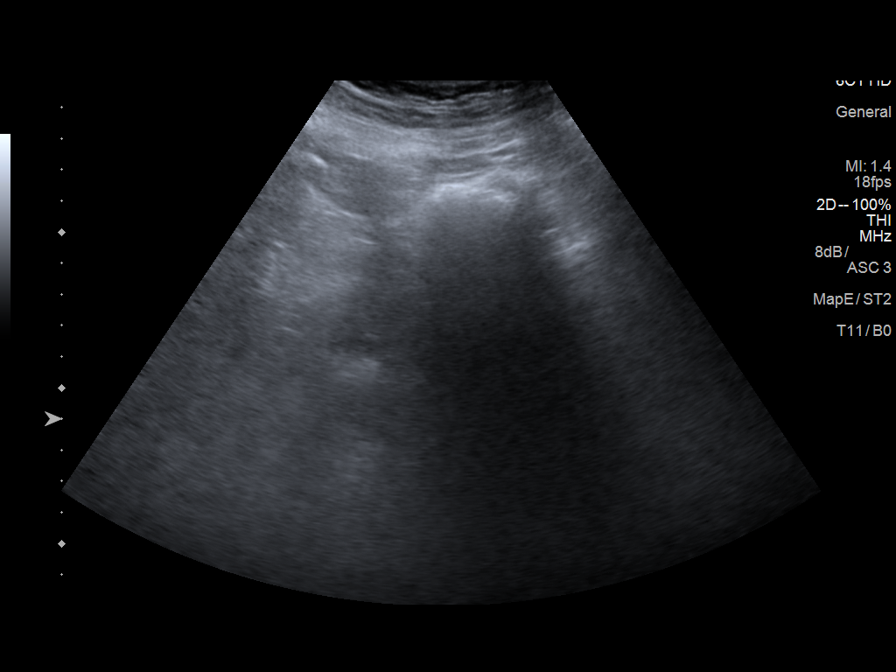
[im 7/8]
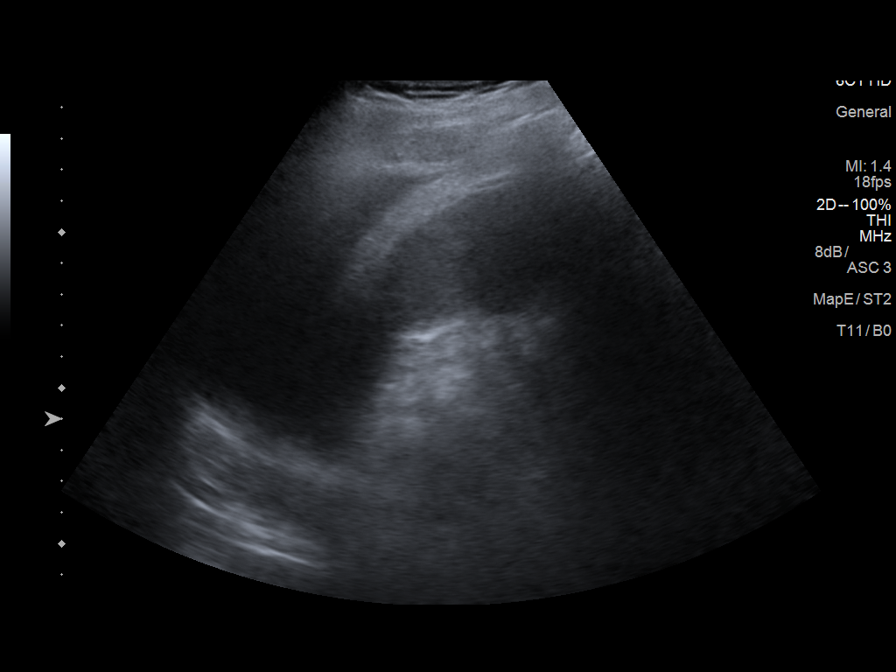
[im 8/8]
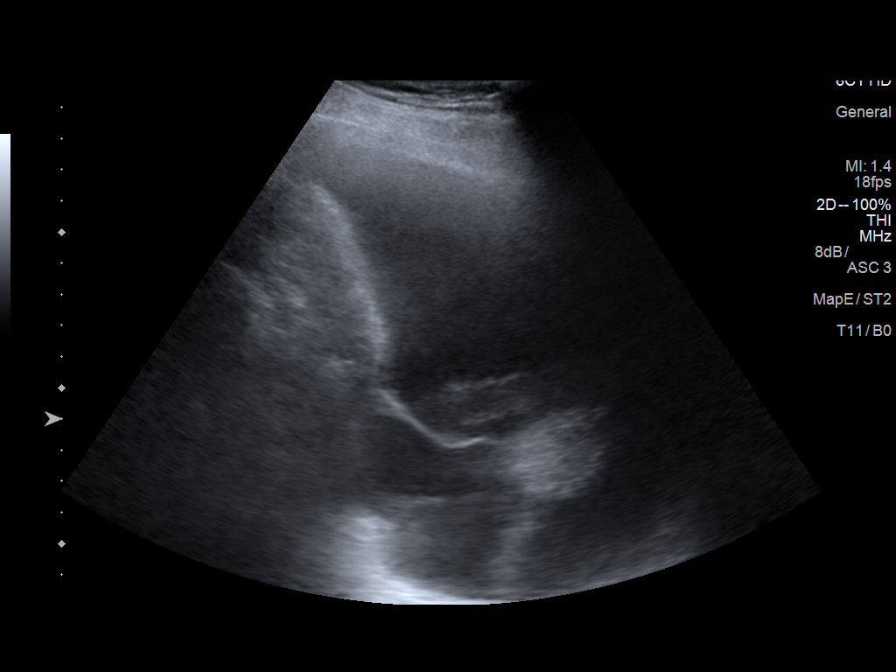

[8 of 8 positions shown; findings below may reference images not displayed]

FINDINGS: Ultrasound over all 4 quadrants was performed. No ascites is seen.
There is incidental right pleural effusion noted.
IMPRESSION: No ascites.  Incidental right pleural effusion.

## 2018-09-18 IMAGING — DX DG CHEST 1V PORT
1 series · 1 of 1 positions shown · non-contrast
Comparison: 04/10/2017

CLINICAL DATA: Heart failure

EXAM:
PORTABLE CHEST 1 VIEW

[chest ap]
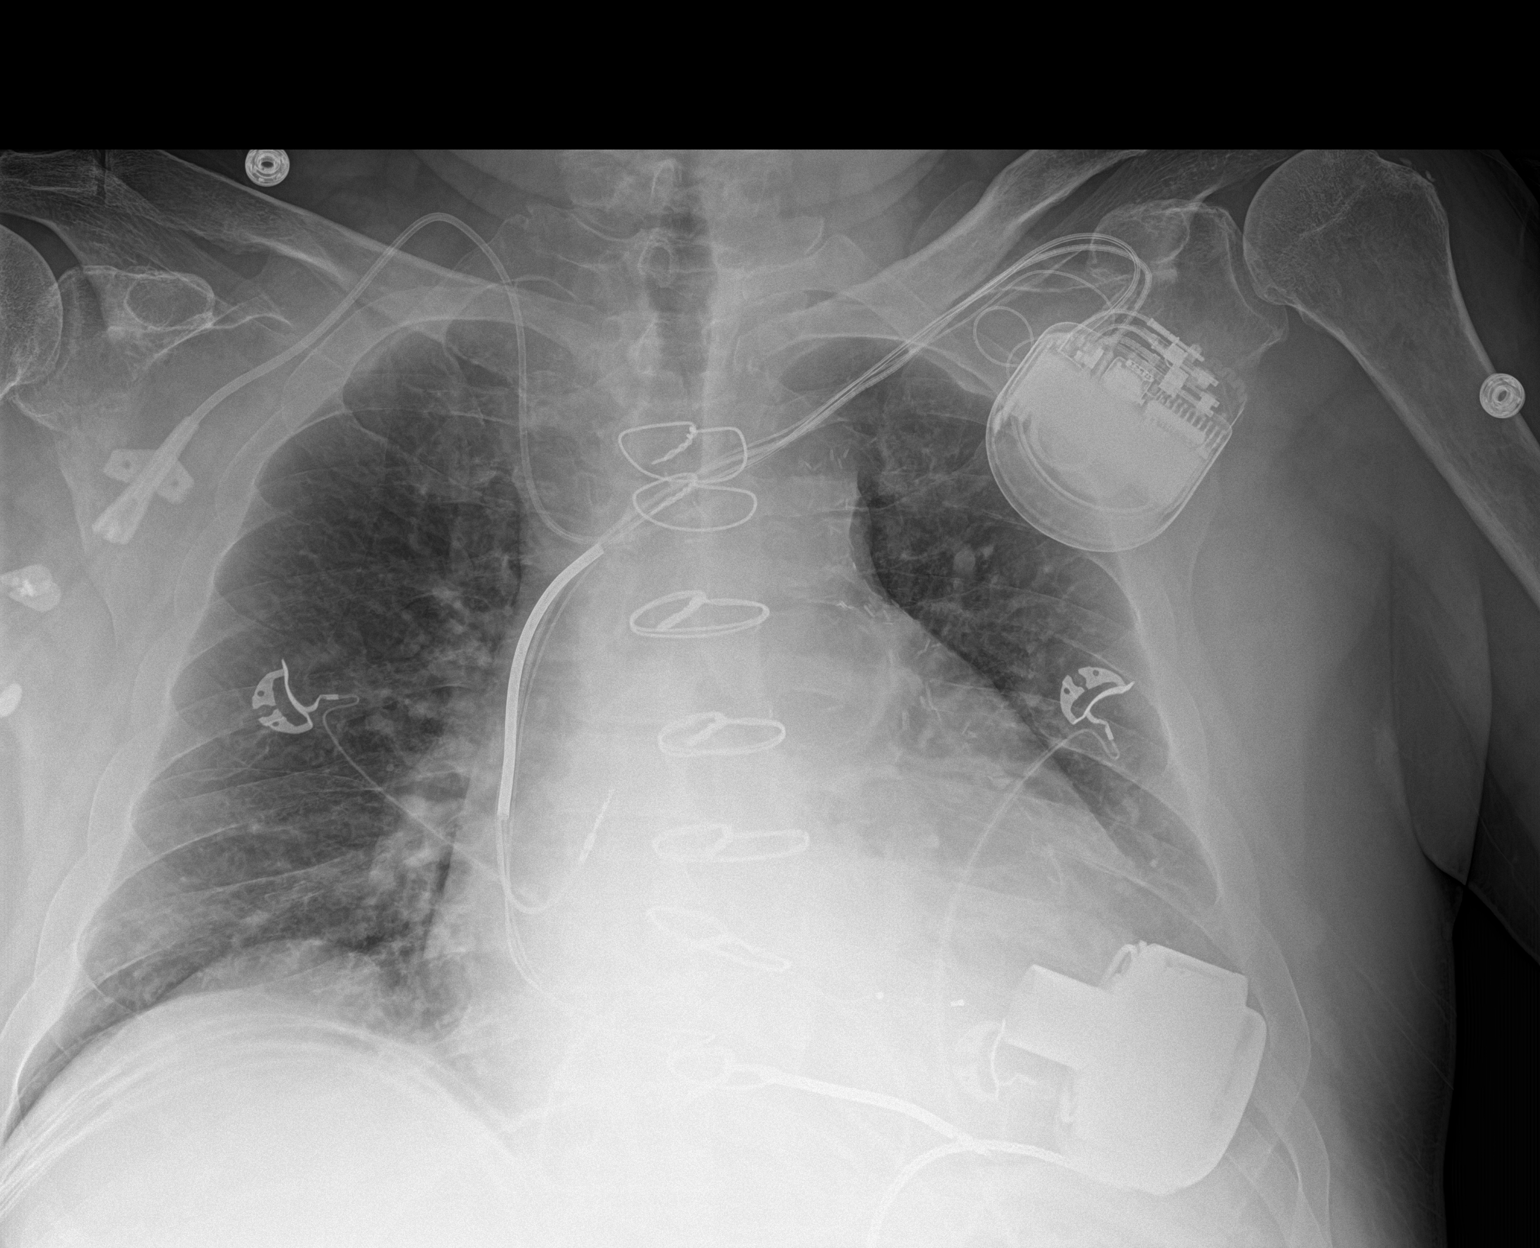

[1 of 1 positions shown; findings below may reference images not displayed]

FINDINGS: LVAD and pacer remain in place, unchanged. Right internal jugular
PICC line now crosses the midline with the tip in the left
innominate vein. Cardiomegaly. Bibasilar atelectasis. No
pneumothorax.
IMPRESSION: Right internal jugular PICC line tip now passes into the left
innominate vein.

Cardiomegaly and bibasilar atelectasis, stable.

## 2018-10-29 IMAGING — DX DG CHEST 1V PORT
1 series · 1 of 1 positions shown · non-contrast
Comparison: Chest radiograph 04/30/2017

CLINICAL DATA: Patient with dizziness and nausea.

EXAM:
PORTABLE CHEST 1 VIEW

[chest ap]
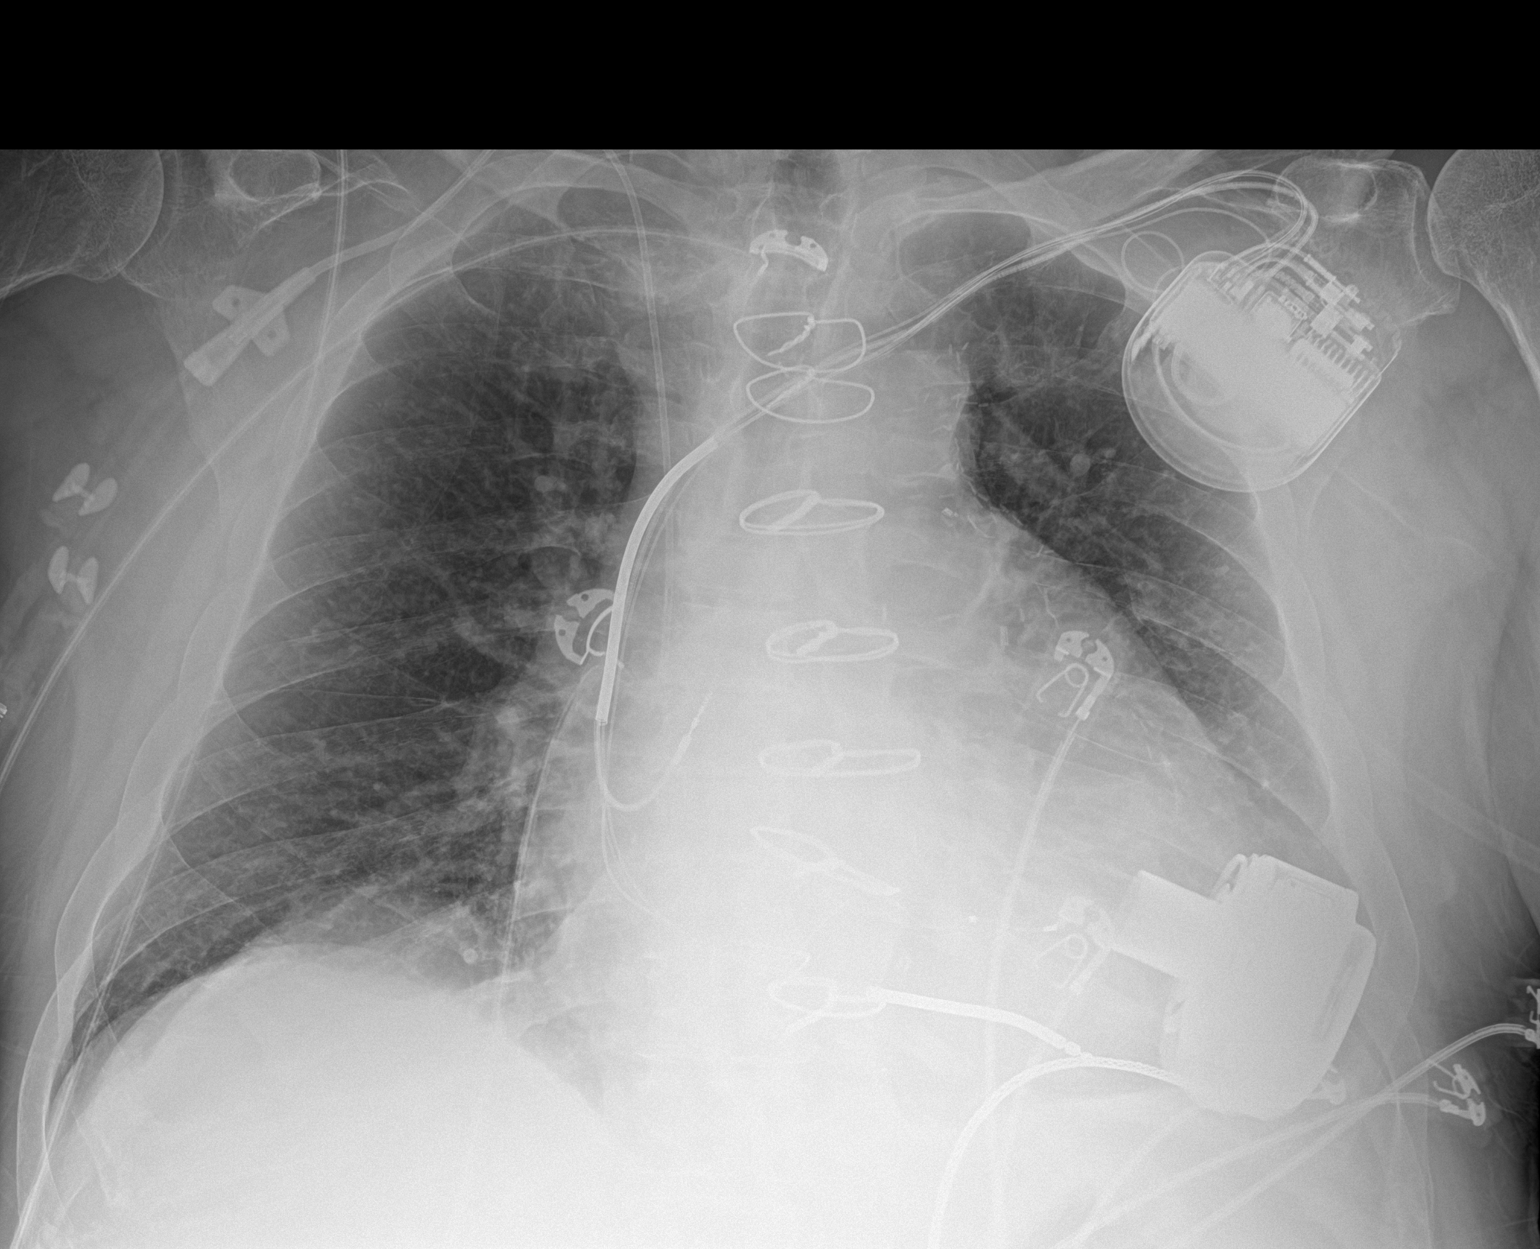

[1 of 1 positions shown; findings below may reference images not displayed]

FINDINGS: Central venous catheter tip projects over the superior cavoatrial
junction. Multi lead AICD device overlies the left hemithorax. LVAD
device. Stable cardiomegaly. Probable small left pleural effusion
and left basilar opacities. No pneumothorax.
IMPRESSION: Probable small left pleural effusion and left basilar atelectasis.

Cardiomegaly.

CVC tip projects over the superior cavoatrial junction.

## 2019-03-16 IMAGING — DX DG CHEST 2V
2 series · 2 of 2 positions shown · non-contrast
Comparison: Few chest x-ray 06/10/2017.

CLINICAL DATA: Occlusion of PICC line.

EXAM:
CHEST  2 VIEW

[chest pa]
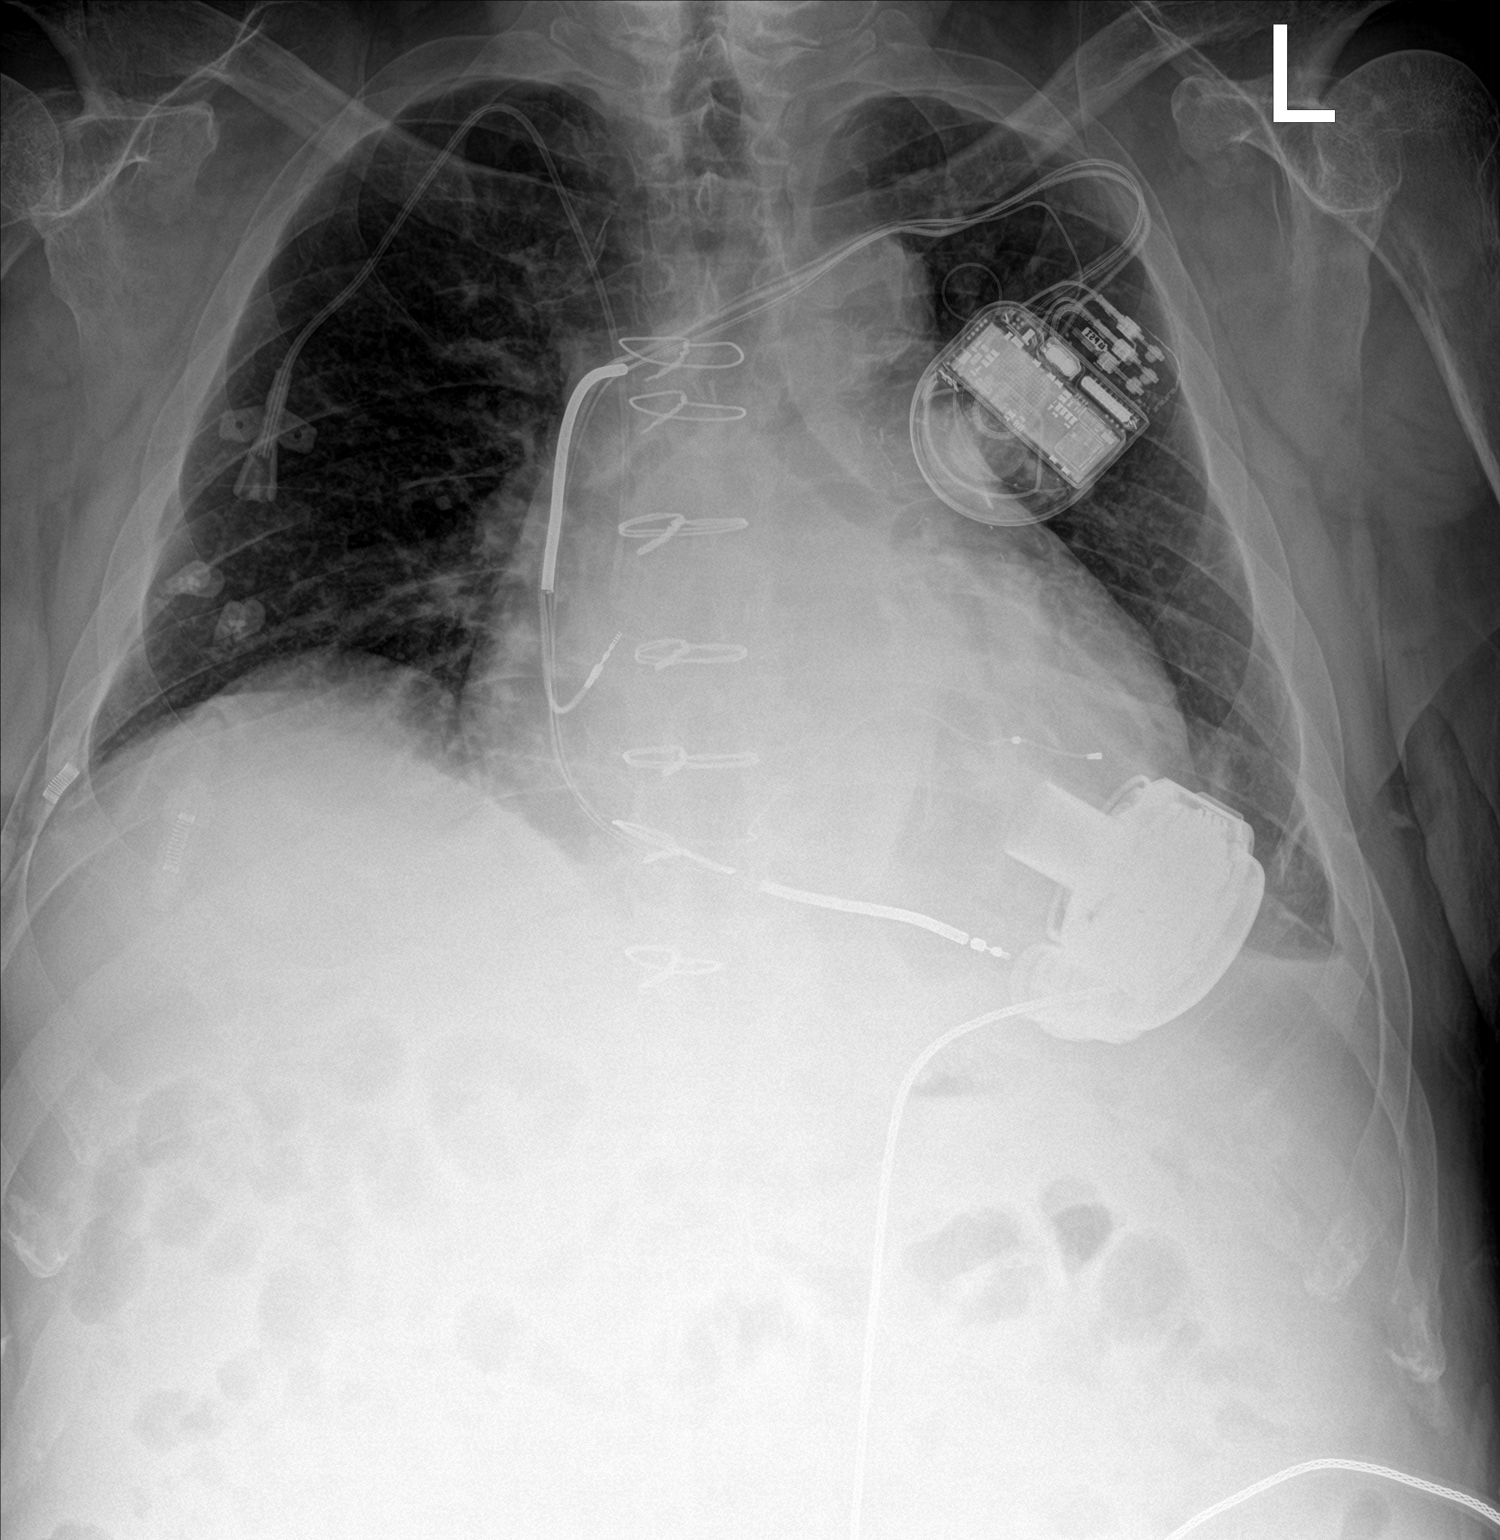

[chest lat]
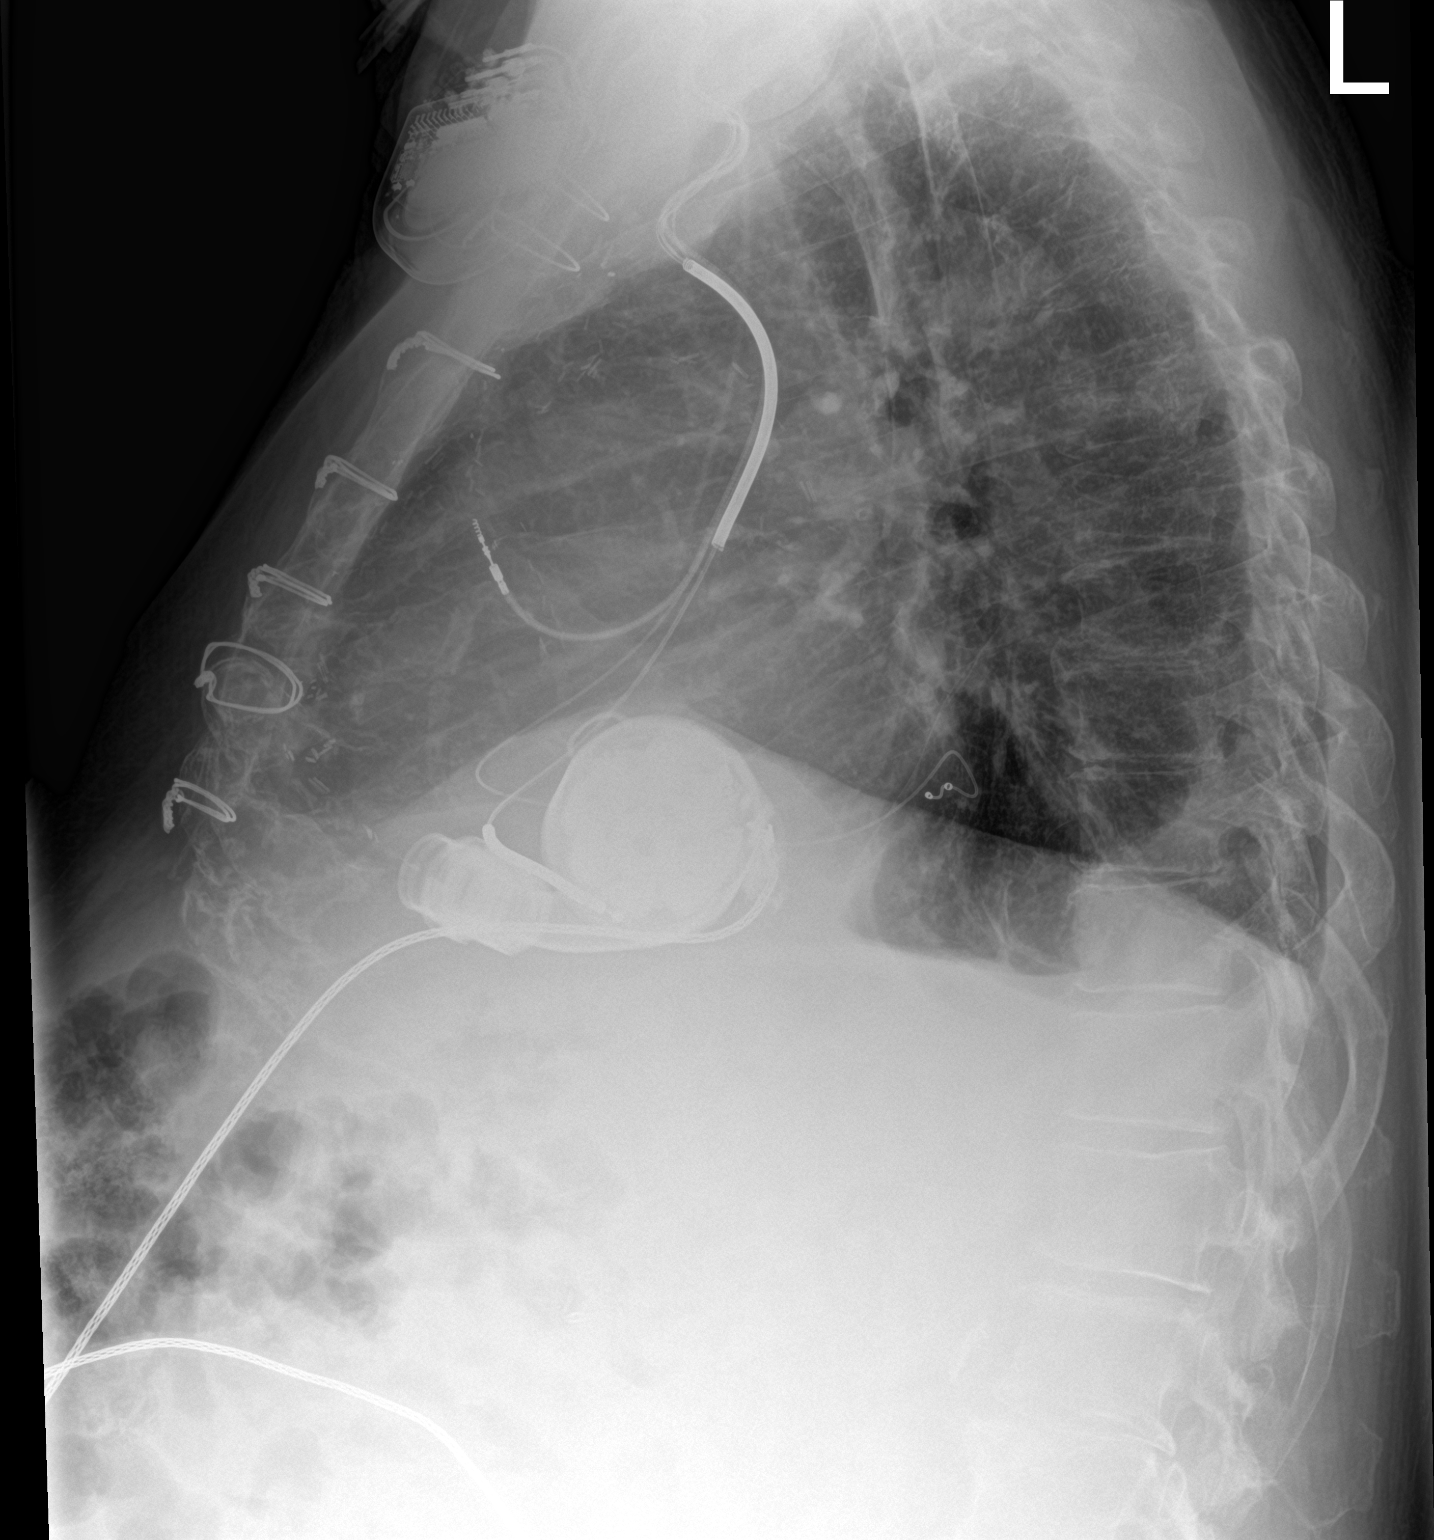

[2 of 2 positions shown; findings below may reference images not displayed]

FINDINGS: A tunneled right IJ catheter is stable in position. The heart is
enlarged. AICD is in place. Left ventricular assist device is in
place. Small bilateral pleural effusions and basilar airspace
disease are present. Mild pulmonary vascular congestion is present.
There is no significant airspace consolidation.
IMPRESSION: 1. Stable tunneled right IJ catheter.  There is no PICC line.
2. AICD and left ventricular assist device are stable.
3. Cardiomegaly with low lung volumes.
4. Mild pulmonary vascular congestion and small effusions likely
reflect some element of congestive heart failure.

## 2019-03-30 IMAGING — DX DG CHEST 1V PORT
1 series · 1 of 1 positions shown · non-contrast
Comparison: Portable exam 1353 hours compared to 10/26/2017

CLINICAL DATA: Shortness of breath and epigastric pain beginning
this morning, abdominal distension, history CHF, hypertension,
diabetes mellitus, COPD, former smoker, coronary artery disease

EXAM:
PORTABLE CHEST 1 VIEW

[chest ap]
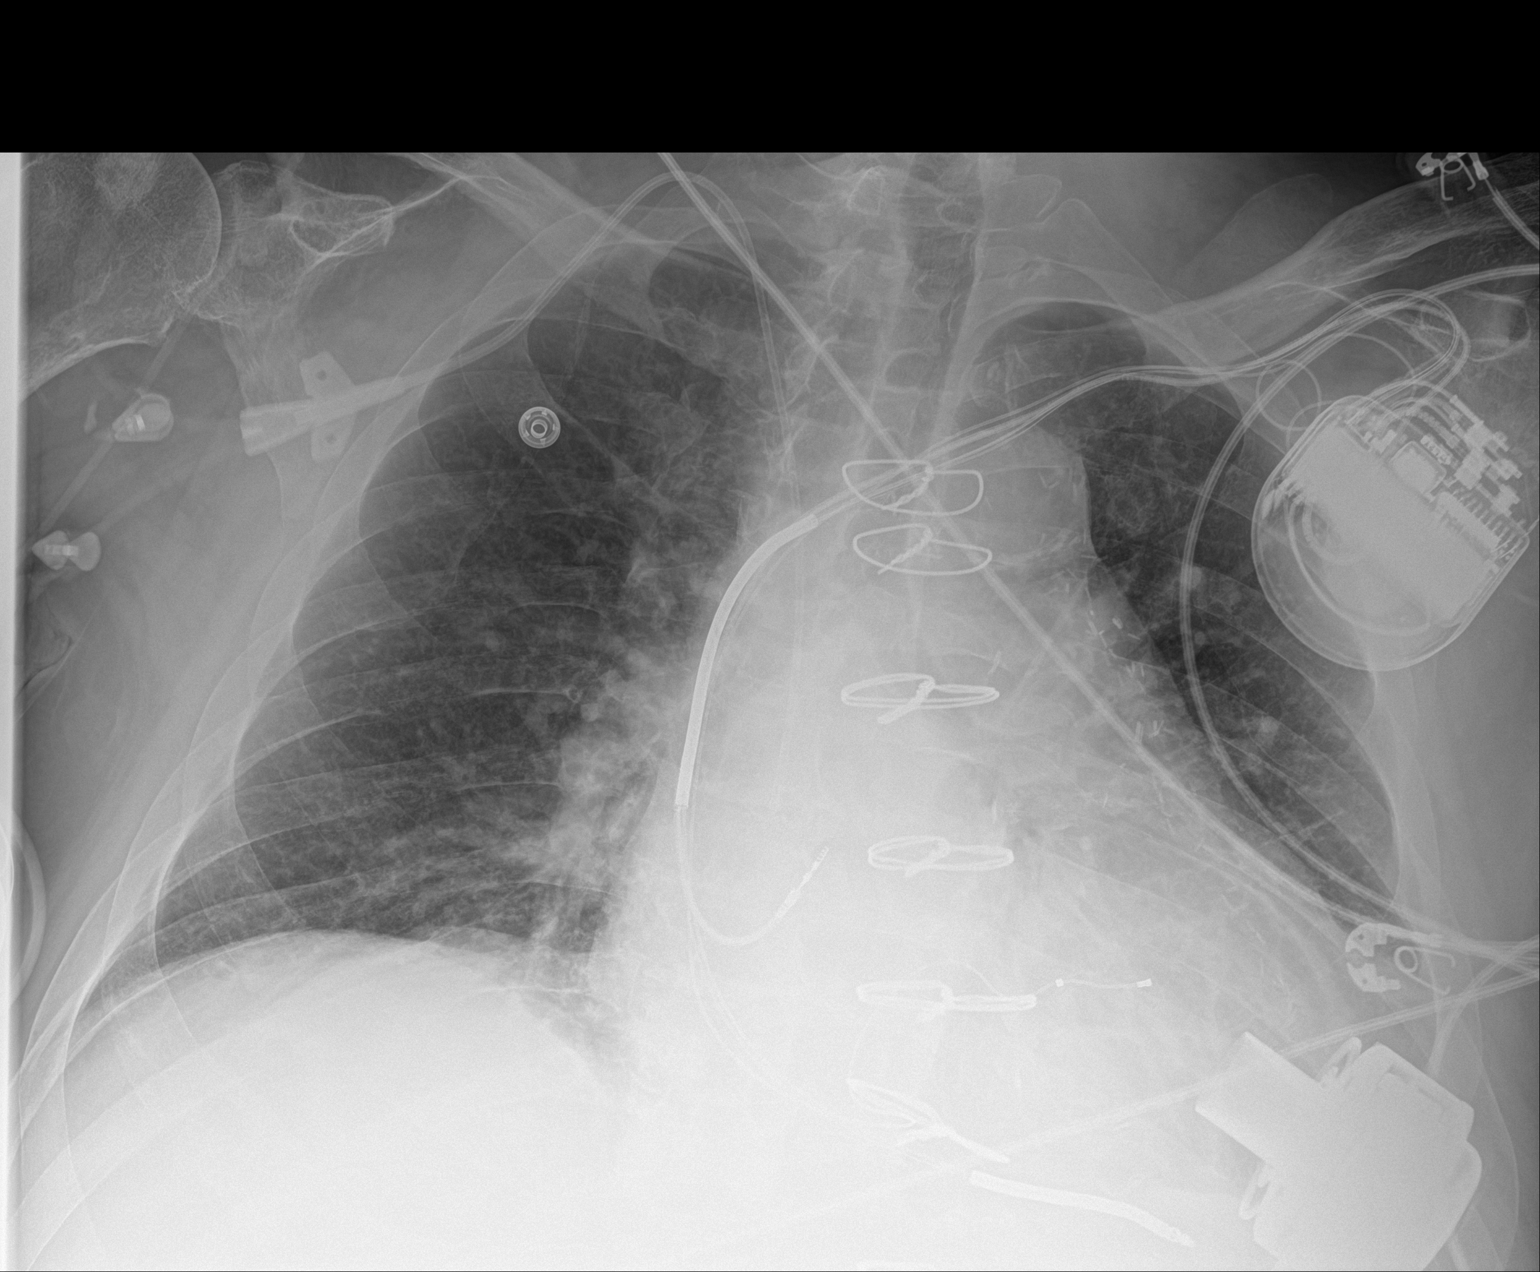

[1 of 1 positions shown; findings below may reference images not displayed]

FINDINGS: LEFT ventricular pacemaker/AICD with leads projecting over RIGHT
atrium, RIGHT ventricle and coronary sinus.

LEFT ventricular assist device present.

RIGHT jugular central venous catheter with tip projecting over
cavoatrial junction.

Enlargement of cardiac silhouette.

Atherosclerotic calcification aorta.

Bibasilar atelectasis.

No gross acute infiltrate, pleural effusion or pneumothorax.

Bones demineralized.
IMPRESSION: Enlargement of cardiac silhouette post median sternotomy,
pacemaker/AICD and LVAD.

Bibasilar atelectasis.

## 2019-03-31 IMAGING — US IR PARACENTESIS
1 series · 6 of 6 positions shown · non-contrast
Comparison: none

INDICATION: Patient with a history of heart failure and an LVAD in place. New
findings of ascites. Request is made for diagnostic and therapeutic
paracentesis. The LVAD nurse was with him the entirety of the
procedure.

[Series 1: ir (id) (id)/(id)/(id) ir · 6 of 6 slices shown]
[im 1/6]
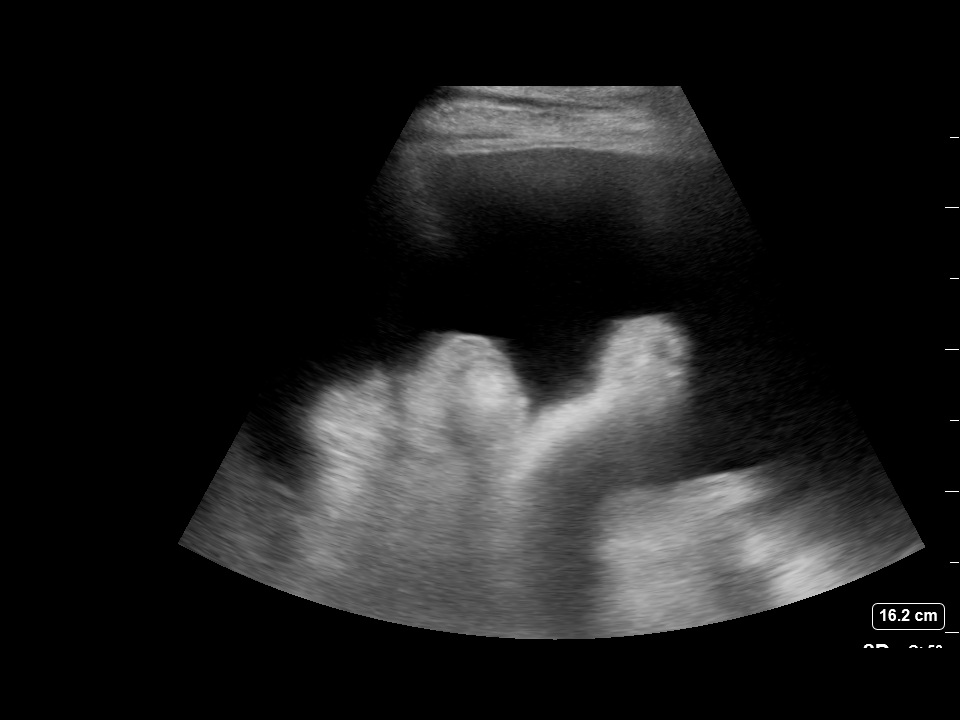
[im 2/6]
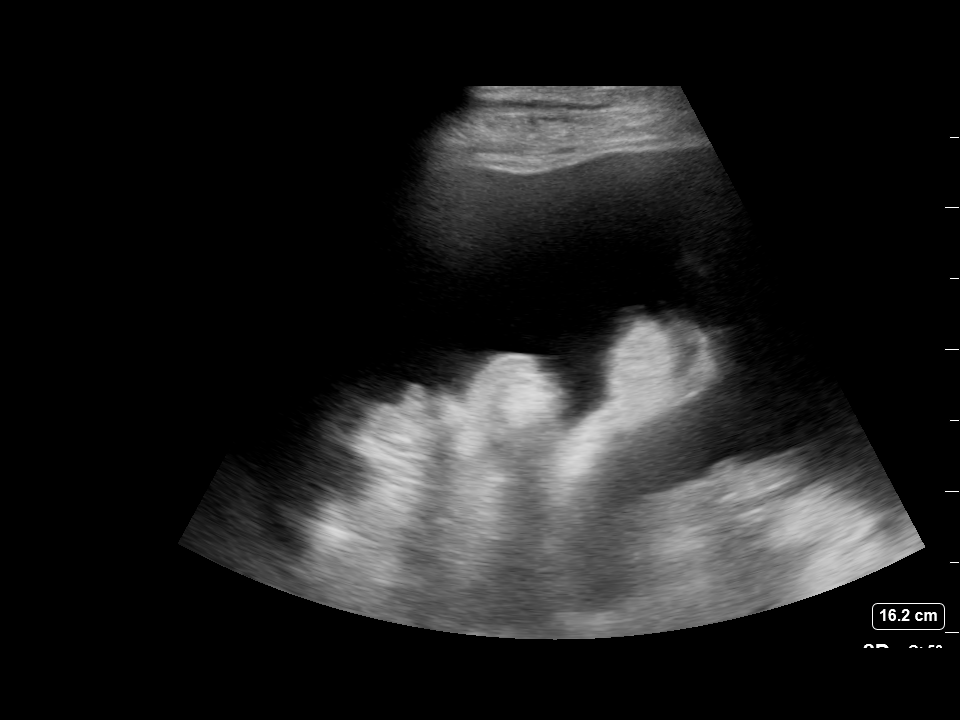
[im 3/6]
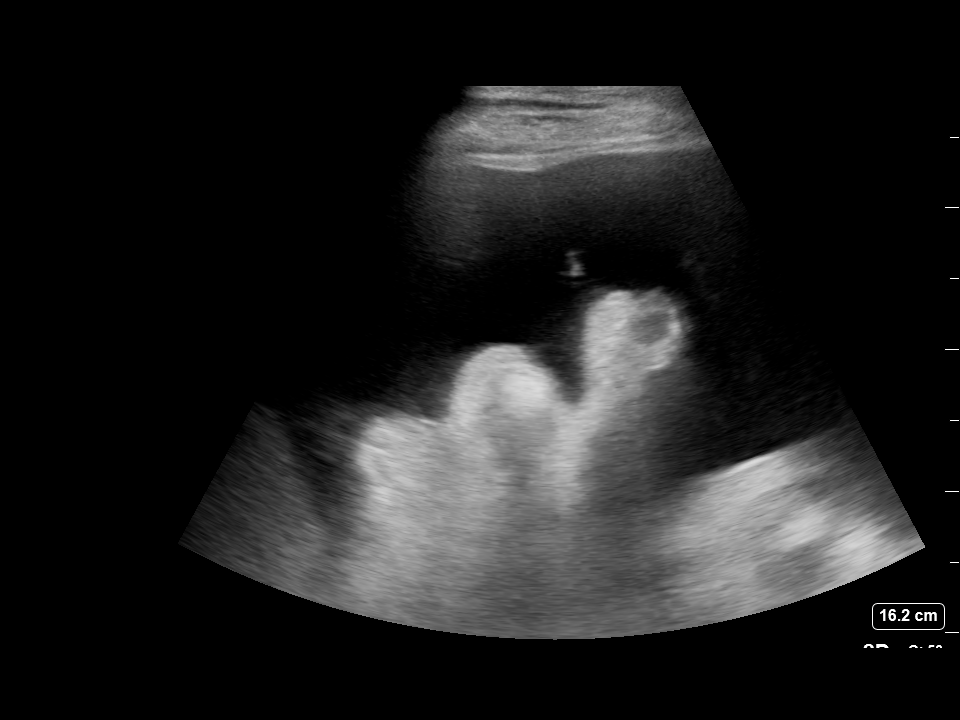
[im 4/6]
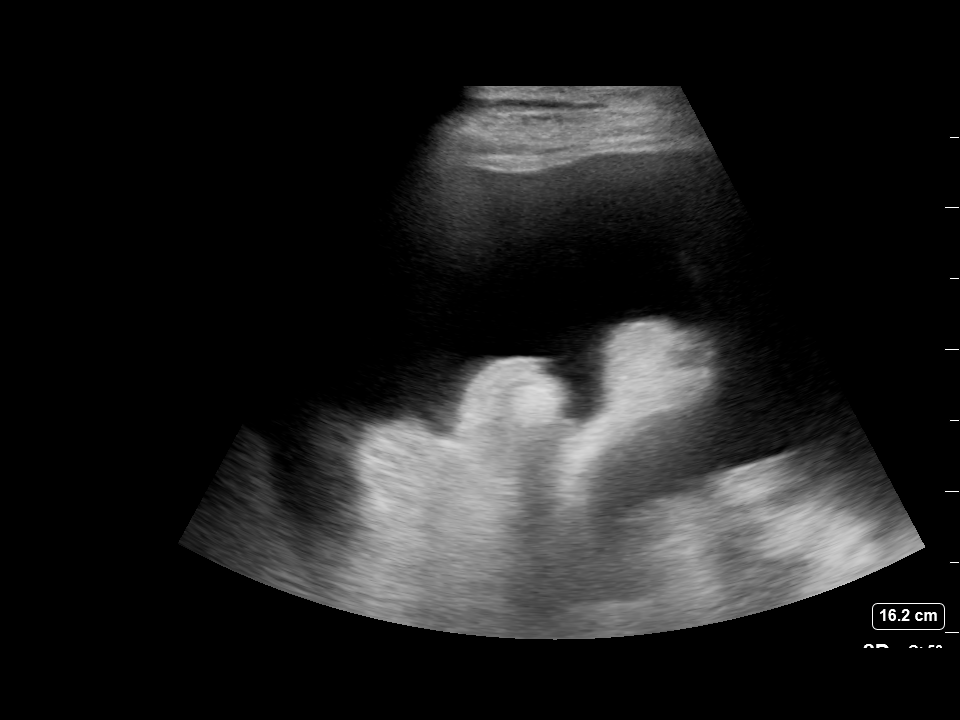
[im 5/6]
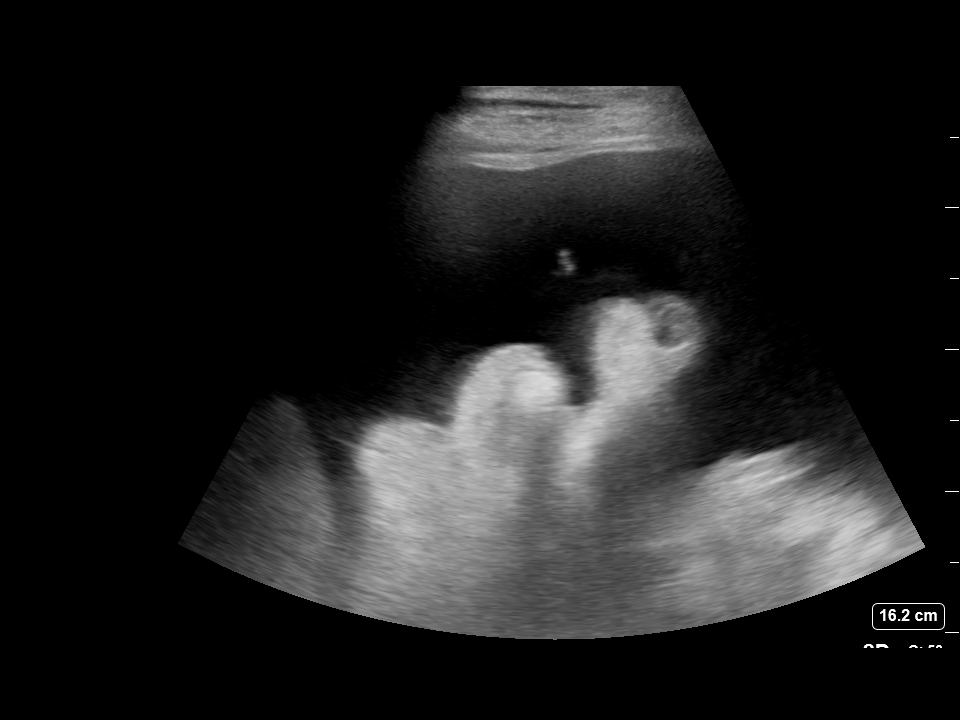
[im 6/6]
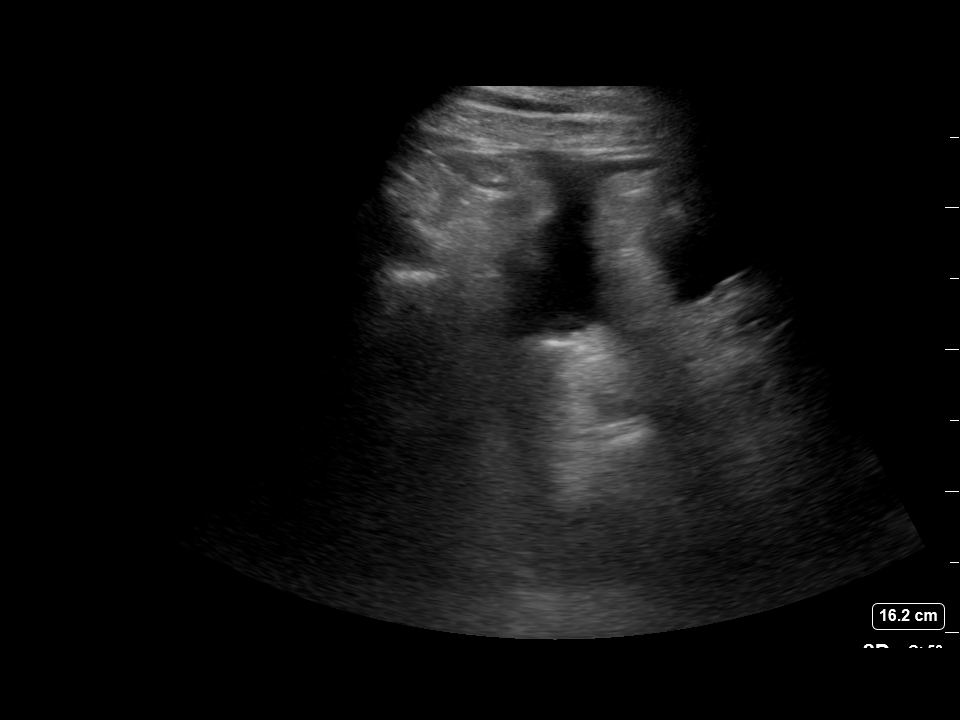

[6 of 6 positions shown; findings below may reference images not displayed]

EXAM:
ULTRASOUND GUIDED DIAGNOSTIC AND THERAPEUTIC PARACENTESIS

MEDICATIONS:
2% lidocaine

COMPLICATIONS:
None immediate.

PROCEDURE:
Informed written consent was obtained from the patient after a
discussion of the risks, benefits and alternatives to treatment. A
timeout was performed prior to the initiation of the procedure.

Initial ultrasound scanning demonstrates a large amount of ascites
within the right lower abdominal quadrant. The right lower abdomen
was prepped and draped in the usual sterile fashion. 2% lidocaine
was used for local anesthesia.

Following this, a 19 gauge, 7-cm, Yueh catheter was introduced. An
ultrasound image was saved for documentation purposes. The
paracentesis was performed. The catheter was removed and a dressing
was applied. The patient tolerated the procedure well without
immediate post procedural complication.
FINDINGS: A total of approximately 5.5 L of serous fluid was removed. Samples
were sent to the laboratory as requested by the clinical team.
IMPRESSION: Successful ultrasound-guided paracentesis yielding 5.5 liters of
peritoneal fluid.

## 2019-04-05 IMAGING — US IR PARACENTESIS
1 series · 5 of 5 positions shown · non-contrast
Comparison: none

INDICATION: Patient with recurrent ascites. Request is made for therapeutic
paracentesis.

[Series 1: ir (id) (id)/(id)/(id) ir · 5 of 5 slices shown]
[im 1/5]
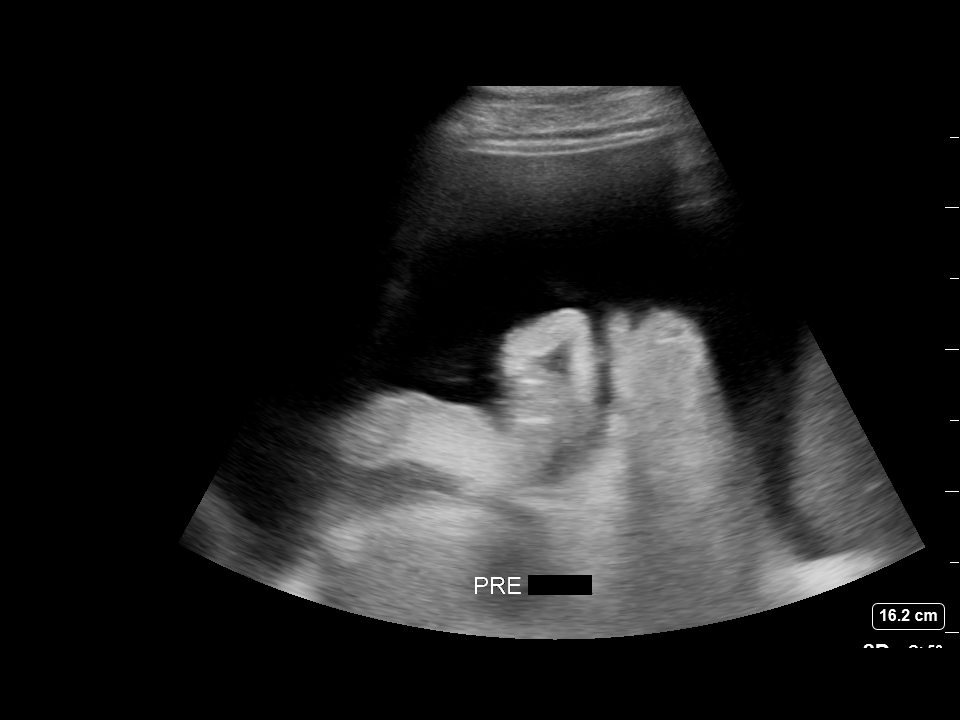
[im 2/5]
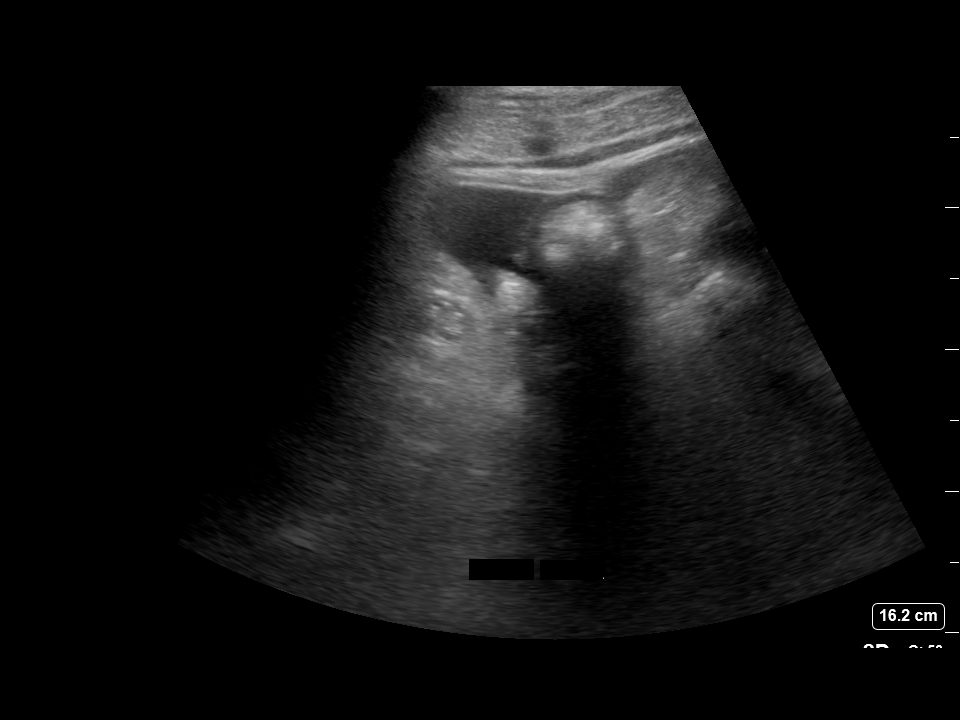
[im 3/5]
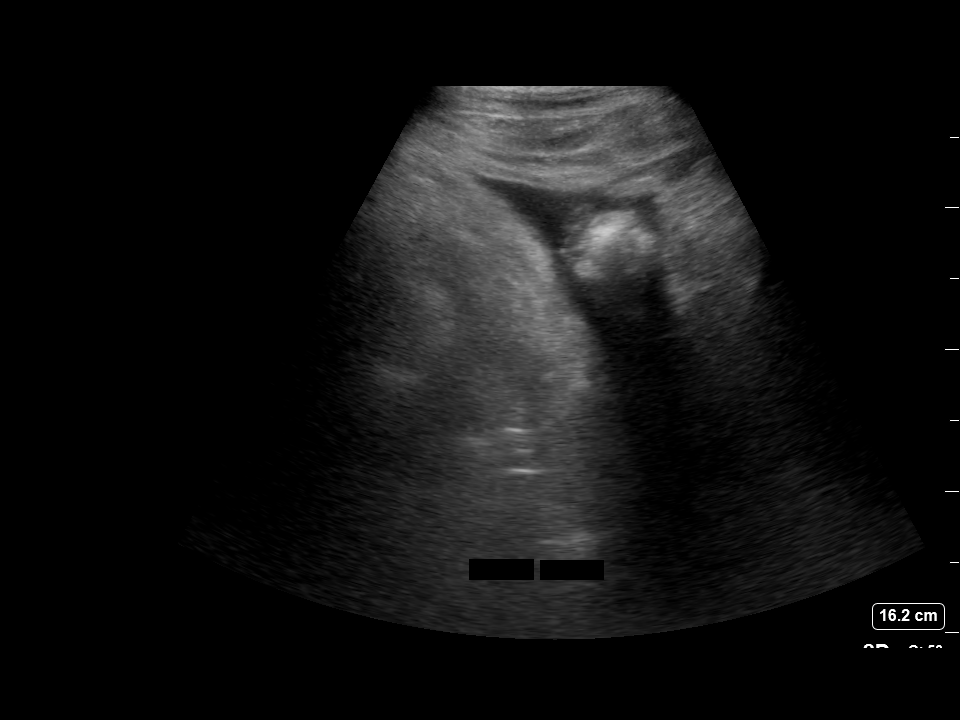
[im 4/5]
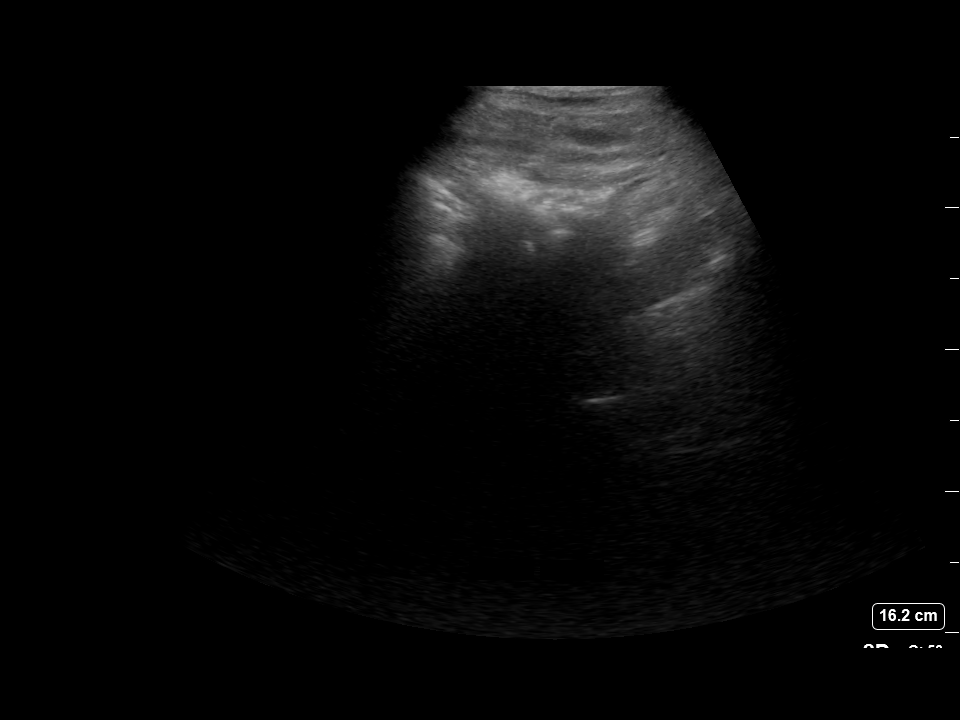
[im 5/5]
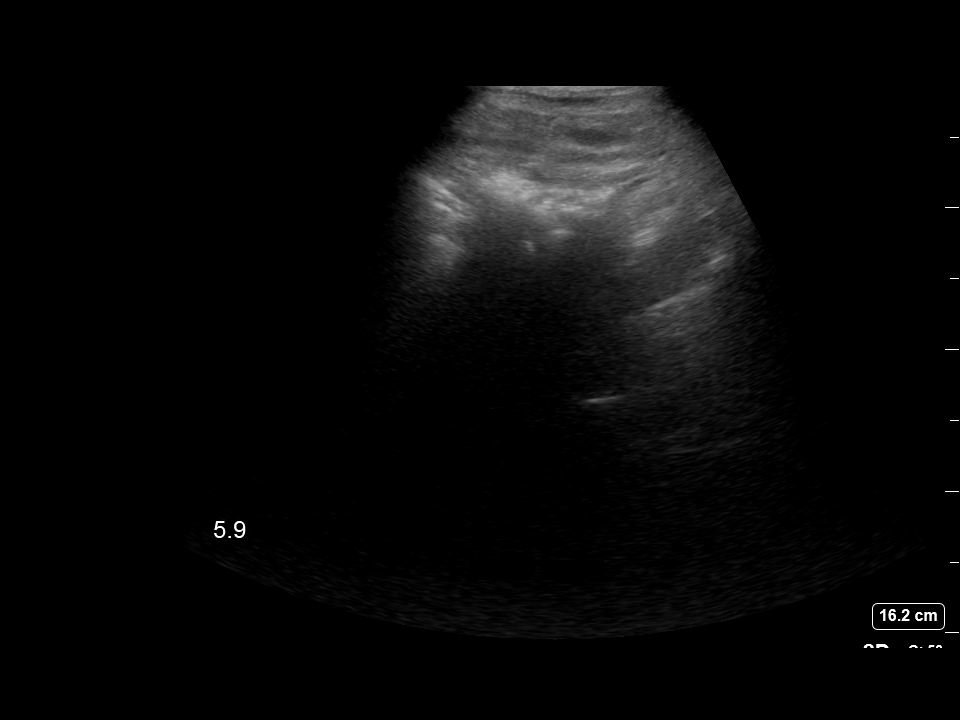

[5 of 5 positions shown; findings below may reference images not displayed]

EXAM:
ULTRASOUND GUIDED THERAPEUTIC PARACENTESIS

MEDICATIONS:
10 mL 2% lidocaine

COMPLICATIONS:
None immediate.

PROCEDURE:
Informed written consent was obtained from the patient after a
discussion of the risks, benefits and alternatives to treatment. A
timeout was performed prior to the initiation of the procedure.

Initial ultrasound scanning demonstrates a large amount of ascites
within the right lower abdominal quadrant. The right lower abdomen
was prepped and draped in the usual sterile fashion. 2% lidocaine
was used for local anesthesia.

Following this, a 19 gauge, 7-cm, Yueh catheter was introduced. An
ultrasound image was saved for documentation purposes. The
paracentesis was performed. The catheter was removed and a dressing
was applied. The patient tolerated the procedure well without
immediate post procedural complication.
FINDINGS: A total of approximately 5.8 liters of yellow fluid was removed.
IMPRESSION: Successful ultrasound-guided paracentesis yielding 5.8 liters of
peritoneal fluid.

## 2019-05-17 IMAGING — US IR PARACENTESIS
1 series · 4 of 4 positions shown · non-contrast
Comparison: none

INDICATION: Patient with history of chronic systolic heart failure, recurrent
ascites. Request is made for therapeutic paracentesis.

[Series 1: ir (id) (id)/(id)/(id) ir · 4 of 4 slices shown]
[im 1/4]
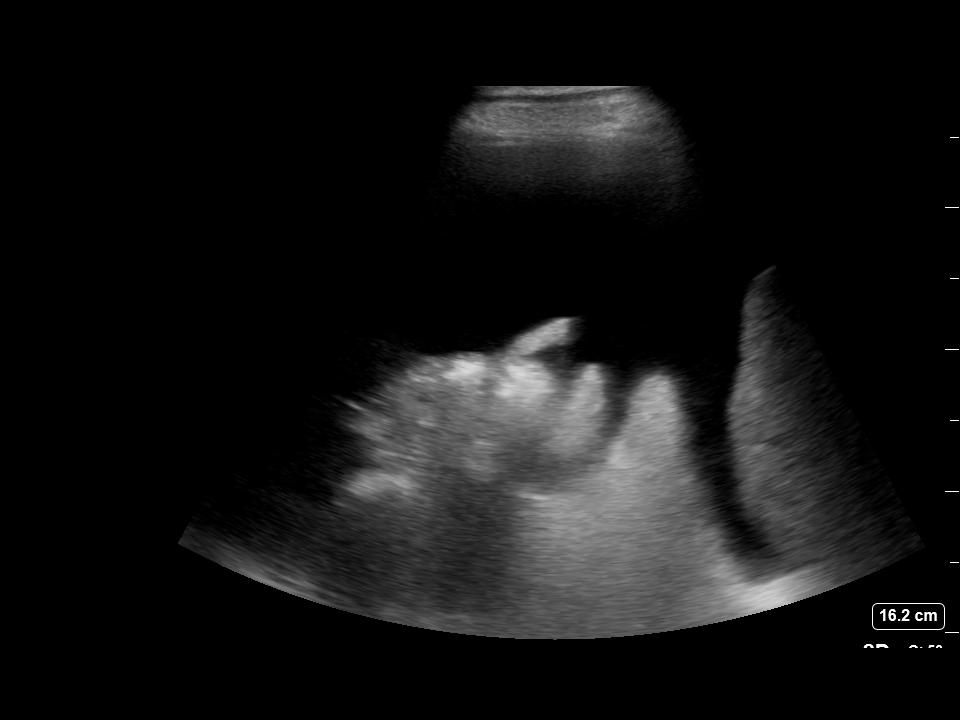
[im 2/4]
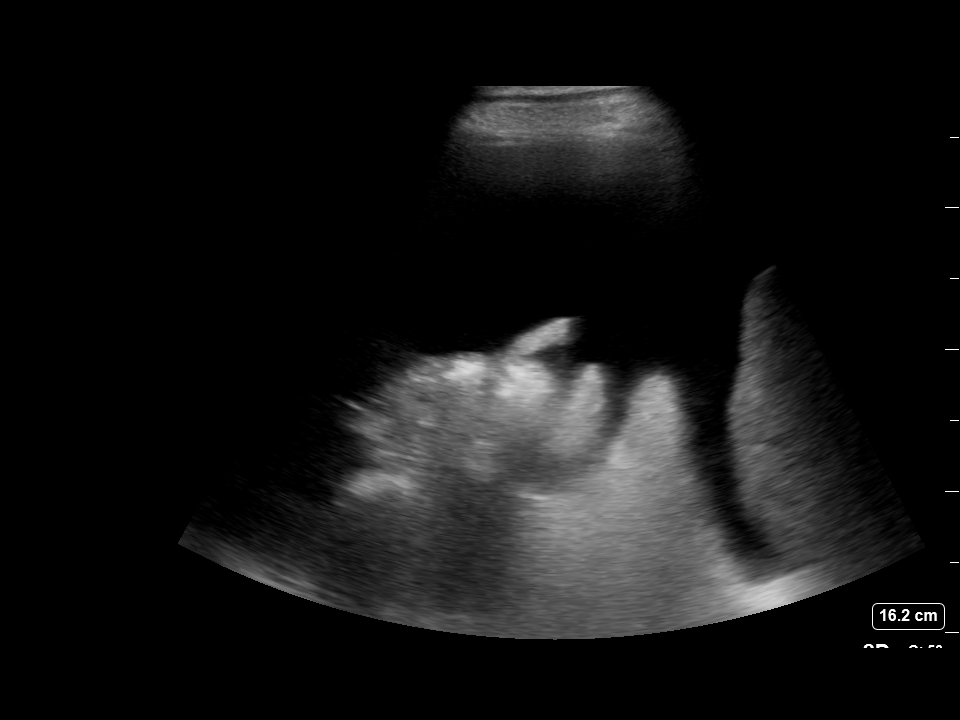
[im 3/4]
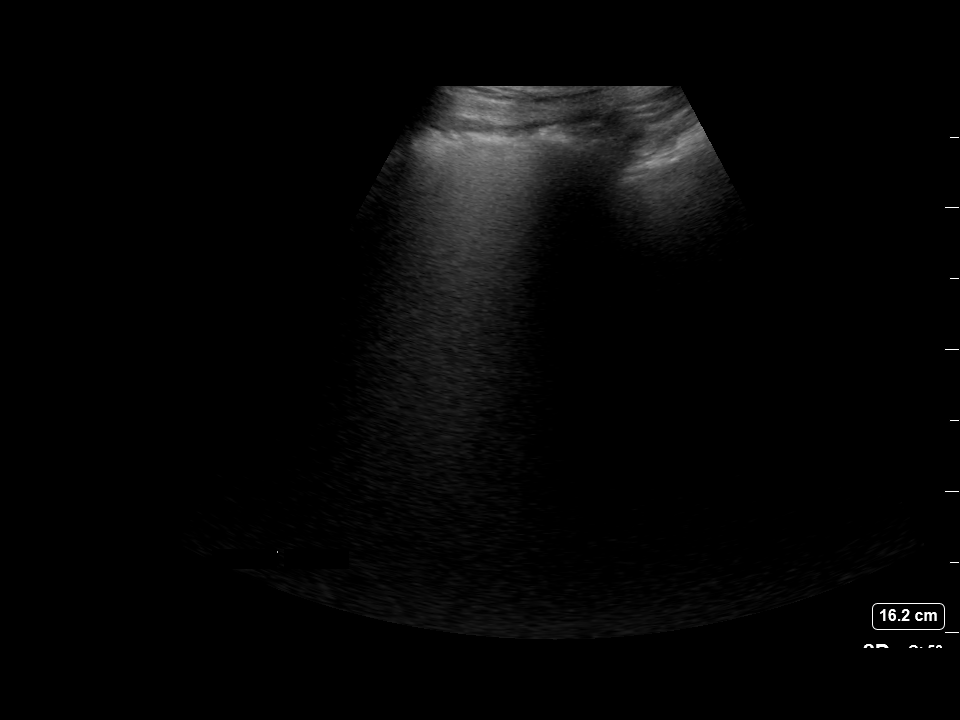
[im 4/4]
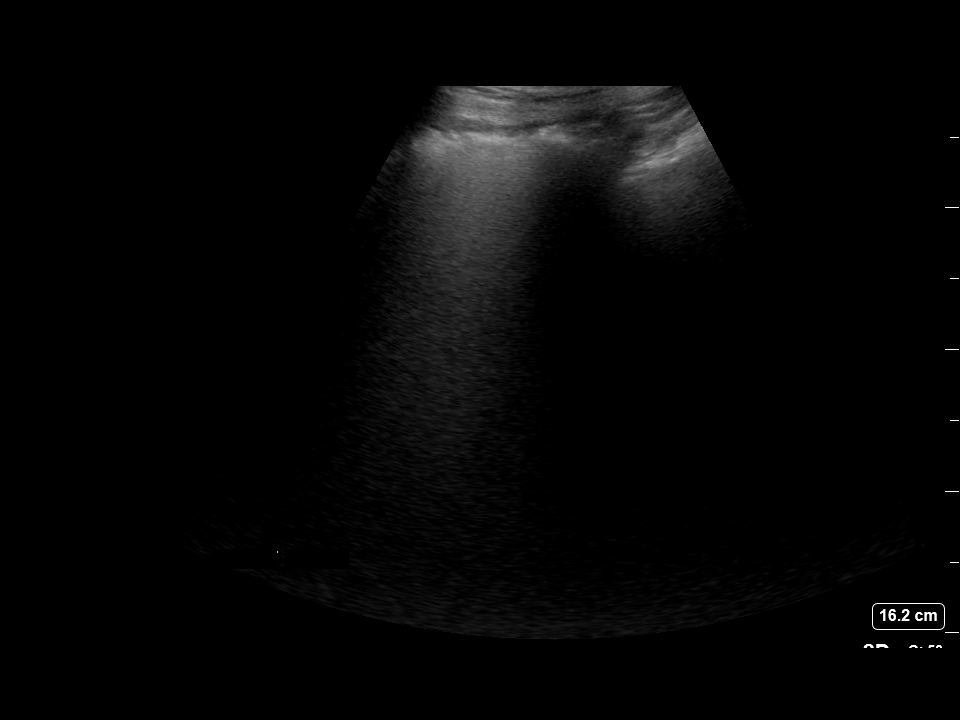

[4 of 4 positions shown; findings below may reference images not displayed]

EXAM:
ULTRASOUND GUIDED THERAPEUTIC PARACENTESIS

MEDICATIONS:
5 mL 2% lidocaine

COMPLICATIONS:
None immediate.

PROCEDURE:
Informed written consent was obtained from the patient after a
discussion of the risks, benefits and alternatives to treatment. A
timeout was performed prior to the initiation of the procedure.

Initial ultrasound scanning demonstrates a moderate amount of
ascites within the right lower abdominal quadrant. The right lower
abdomen was prepped and draped in the usual sterile fashion. 2%
lidocaine was used for local anesthesia.

Following this, a 19 gauge, 7-cm, Yueh catheter was introduced. An
ultrasound image was saved for documentation purposes. The
paracentesis was performed. The catheter was removed and a dressing
was applied. The patient tolerated the procedure well without
immediate post procedural complication.
FINDINGS: A total of approximately 2.8 liters of yellow fluid was removed.
IMPRESSION: Successful ultrasound-guided therapeutic paracentesis yielding
liters of peritoneal fluid.

## 2019-06-01 IMAGING — US IR PARACENTESIS
1 series · 2 of 2 positions shown · non-contrast
Comparison: none

INDICATION: Heart failure with LVAD in place.

[Series 1: ir (id) (id)/(id)/(id) ir · 2 of 2 slices shown]
[im 1/2]
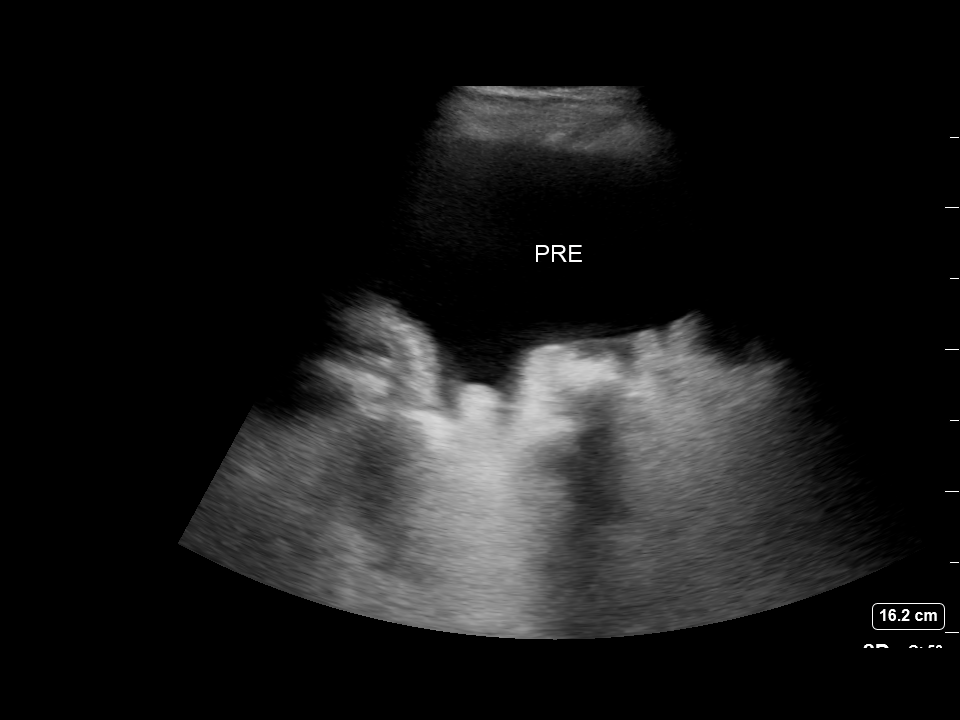
[im 2/2]
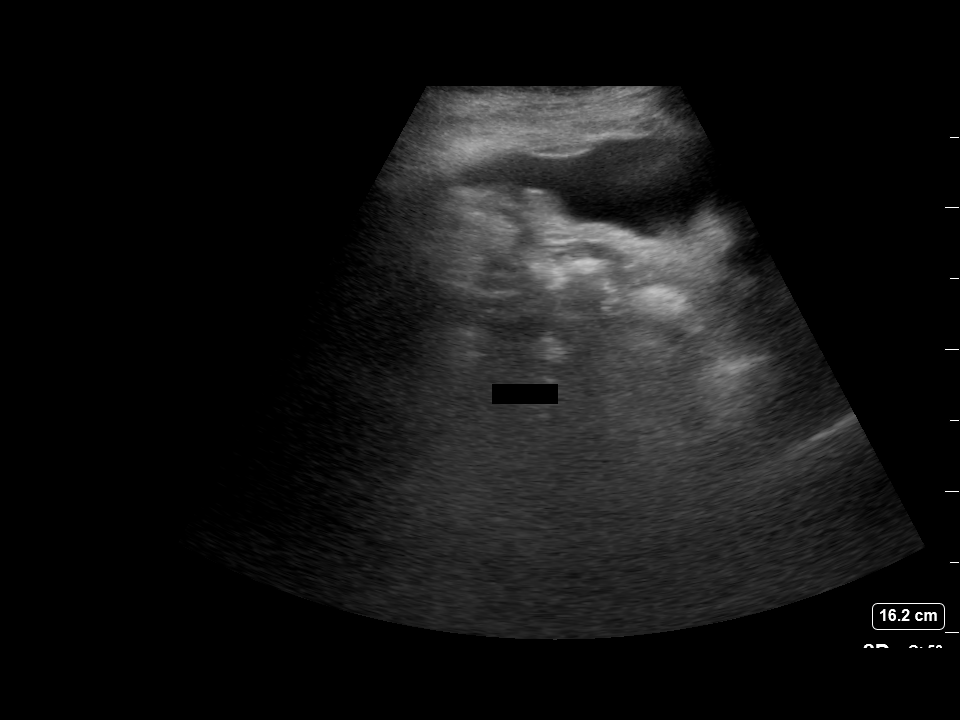

[2 of 2 positions shown; findings below may reference images not displayed]

Recurrent ascites.

Request for therapeutic paracentesis.

EXAM:
ULTRASOUND GUIDED THERAPEUTIC PARACENTESIS

MEDICATIONS:
10 mL of 2% lidocaine

COMPLICATIONS:
None immediate.

PROCEDURE:
Informed written consent was obtained from the patient after a
discussion of the risks, benefits and alternatives to treatment. A
timeout was performed prior to the initiation of the procedure.

Initial ultrasound scanning demonstrates a large amount of ascites
within the left lower abdominal quadrant. The left lower abdomen was
prepped and draped in the usual sterile fashion. 2% lidocaine was
used for local anesthesia.

Following this, a 19 gauge, 7-cm, Yueh catheter was introduced. An
ultrasound image was saved for documentation purposes. The
paracentesis was performed. The catheter was removed and a dressing
was applied. The patient tolerated the procedure well without
immediate post procedural complication.
FINDINGS: A total of approximately 4.8 liters of yellow fluid was removed.
IMPRESSION: Successful ultrasound-guided therapeutic paracentesis yielding
liters of peritoneal fluid.

## 2019-06-07 IMAGING — CT CT HEAD W/O CM
4 series · 16 of 47 positions shown, 18 images · non-contrast
Comparison: 04/10/2017

CLINICAL DATA: LVAD pt Pt fell and hit head this am C/o generalized
head pain ataxia

EXAM:
CT HEAD WITHOUT CONTRAST
TECHNIQUE: Contiguous axial images were obtained from the base of the skull
through the vertex without intravenous contrast.

[Series 3: head without · axial · non-contrast · 0.44mm/px · z∈[-81,+39]mm · 7 of 33 slices shown, 9 images]
[im 5/33  brain]
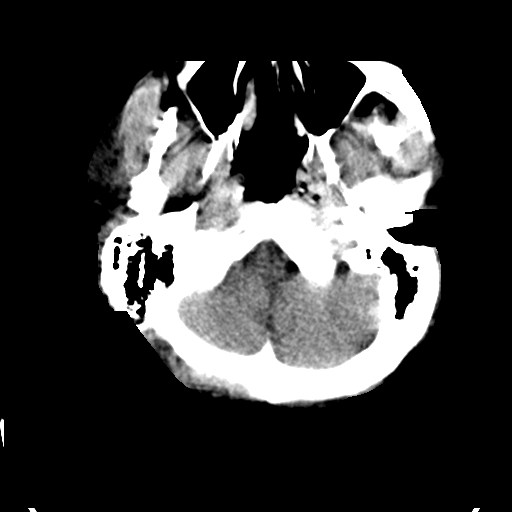
[im 5/33  bone]
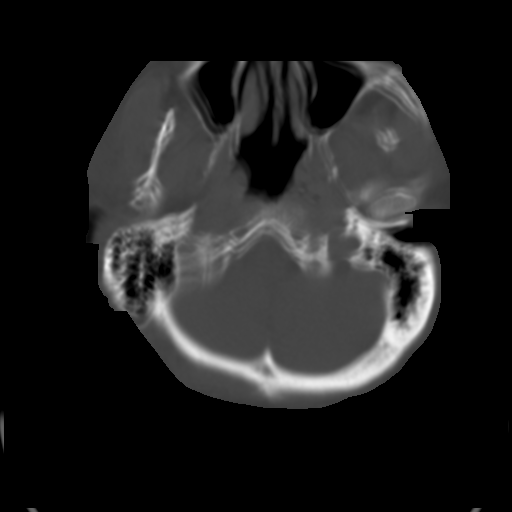
[im 9/33  brain]
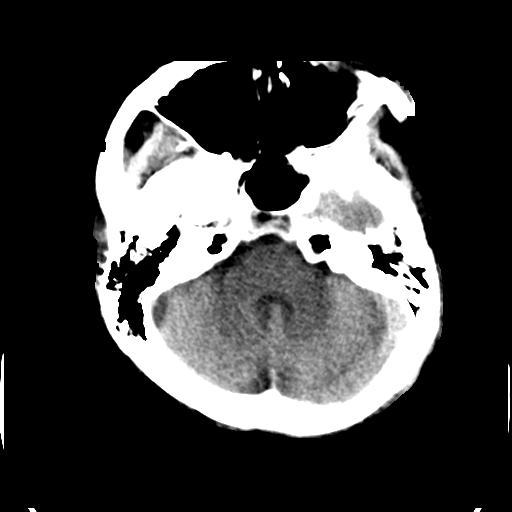
[im 13/33  brain]
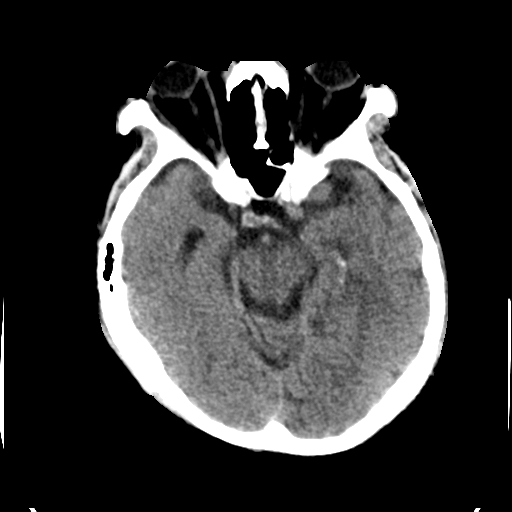
[im 17/33  brain]
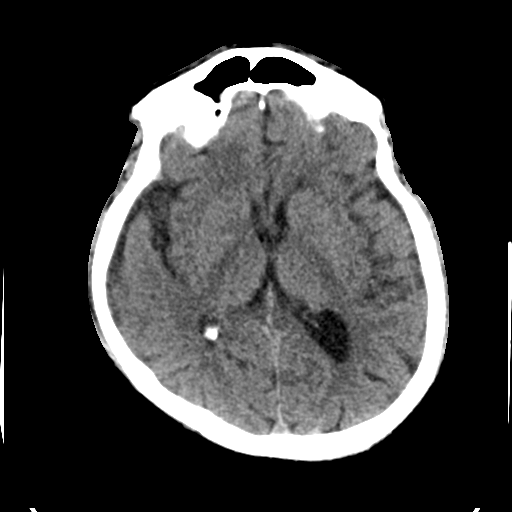
[im 21/33  brain]
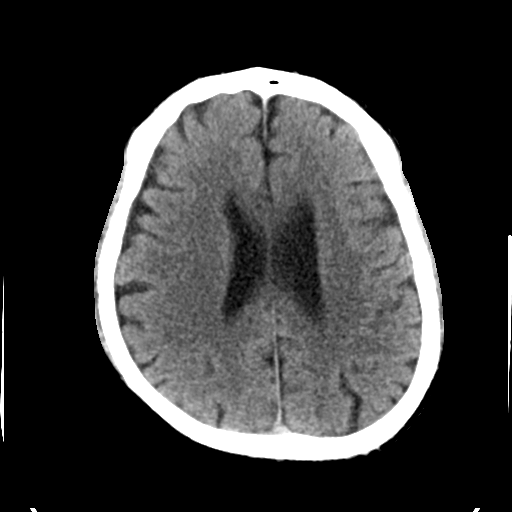
[im 21/33  bone]
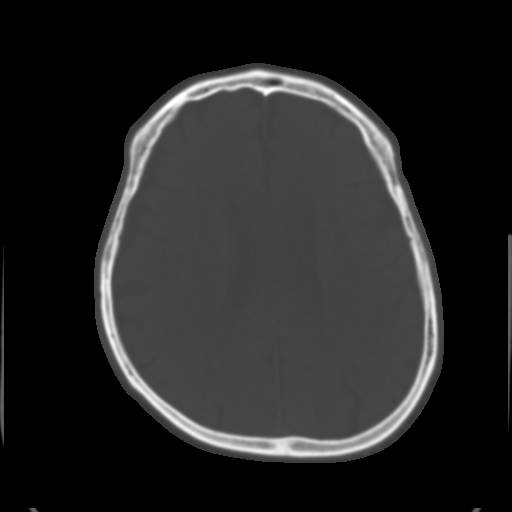
[im 25/33  brain]
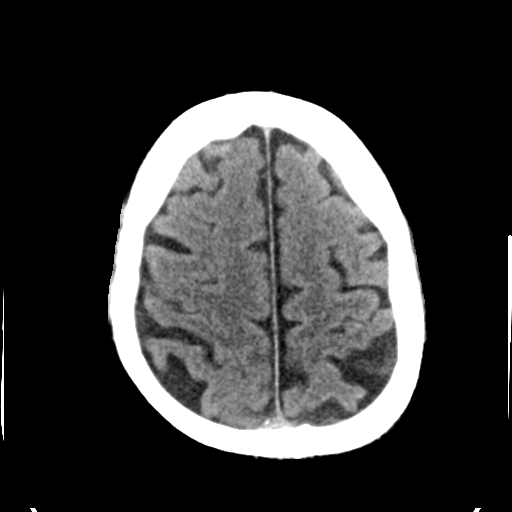
[im 29/33  brain]
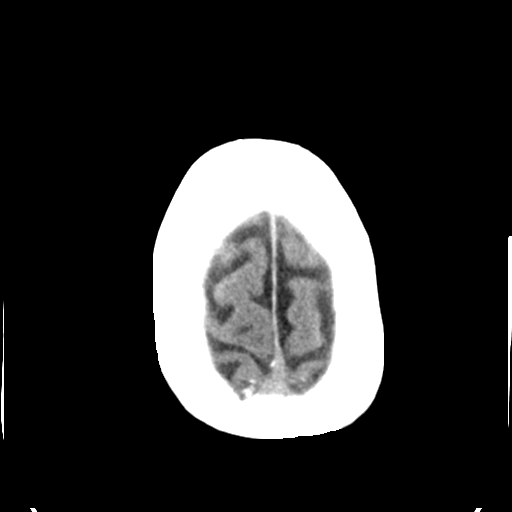

[Series 4: head bone · axial · 0.44mm/px · z∈[-85,-53]mm · 3 of 83 slices shown]
[im 9/83  bone]
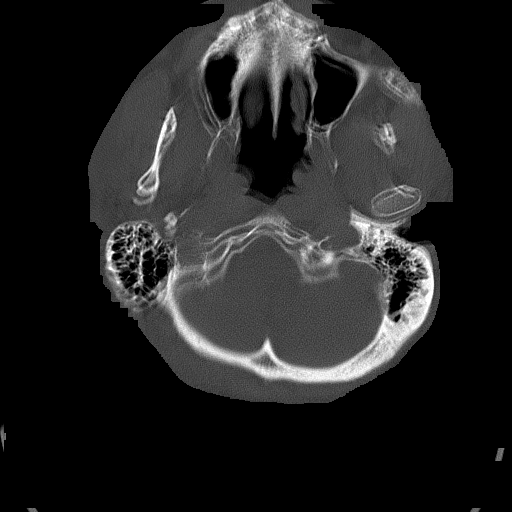
[im 17/83  bone]
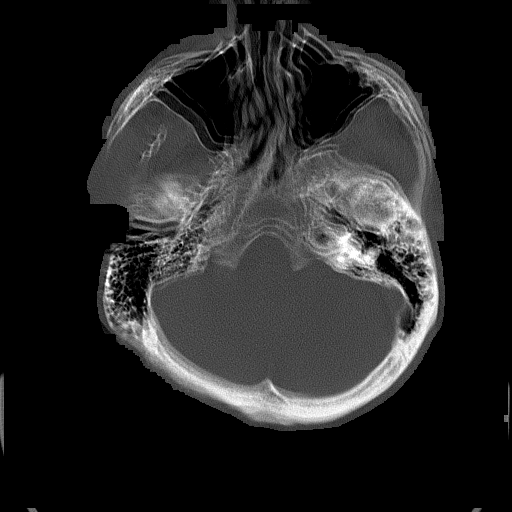
[im 25/83  bone]
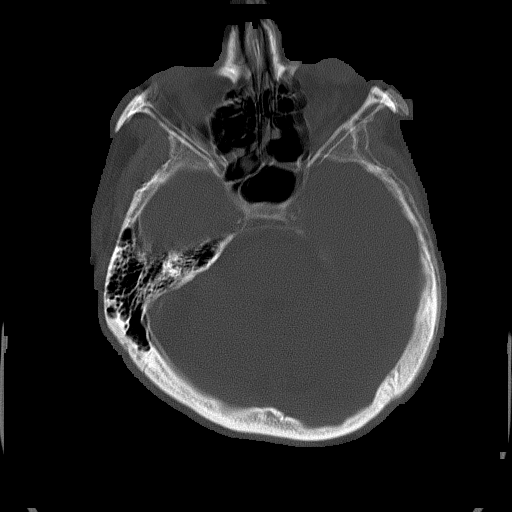

[Series 5: head without cor · coronal · non-contrast · 0.32mm/px · 3 of 72 slices shown]
[im 24/72  brain]
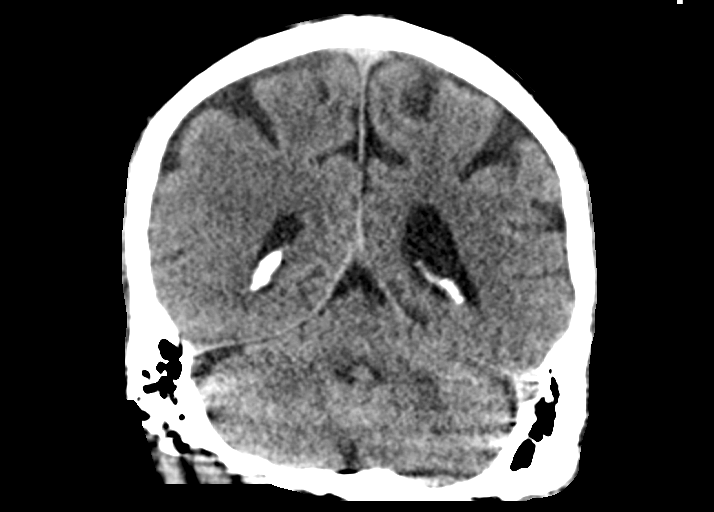
[im 32/72  brain]
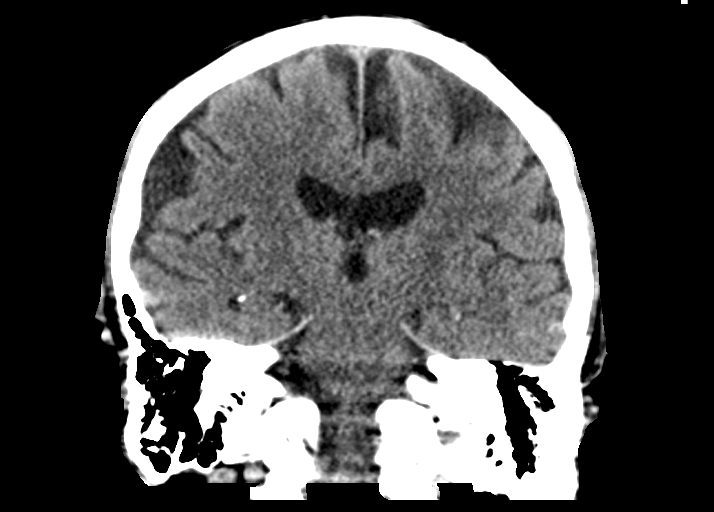
[im 40/72  brain]
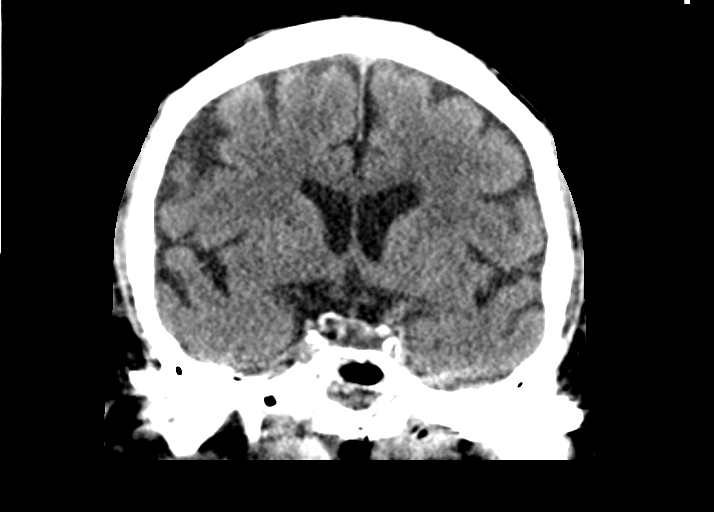

[Series 6: head without sag · sagittal · non-contrast · 0.36mm/px · 3 of 65 slices shown]
[im 22/65  brain]
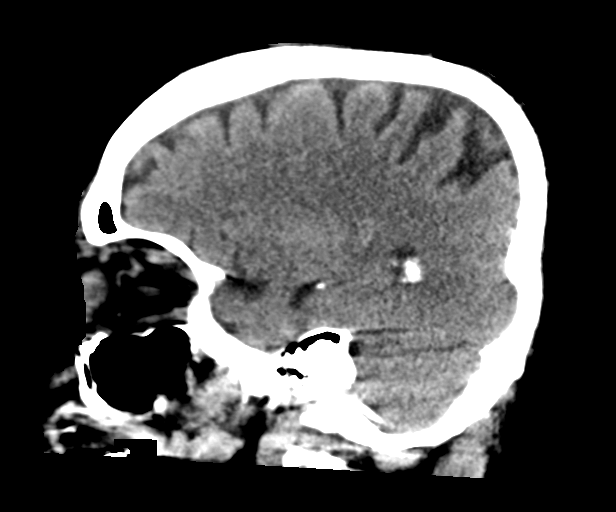
[im 33/65  brain]
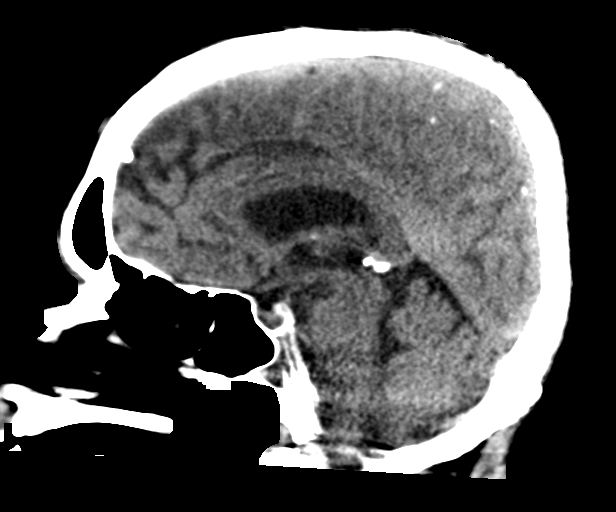
[im 43/65  brain]
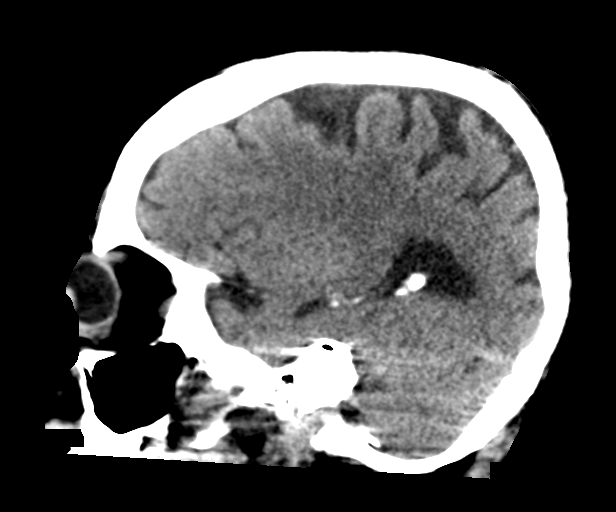

[16 of 47 positions shown; findings below may reference images not displayed]

FINDINGS: Brain: Mild atrophy. No evidence of acute infarction, hemorrhage,
hydrocephalus, extra-axial collection or mass lesion/mass effect.

Vascular: Atherosclerotic and physiologic intracranial
calcifications.

Skull: Normal. Negative for fracture or focal lesion.

Sinuses/Orbits: Some motion degradation.  No acute findings evident.

Other: None.
IMPRESSION: Negative

## 2019-06-16 IMAGING — US IR US GUIDE VASC ACCESS RIGHT
1 series · 1 of 1 positions shown · non-contrast
Comparison: None

CLINICAL DATA: Heart failure, needs durable venous access for
dobutamine infusion

EXAM:
TUNNELED CENTRAL VENOUS CATHETER PLACEMENT WITH ULTRASOUND AND
FLUOROSCOPIC GUIDANCE
TECHNIQUE: The procedure, risks, benefits, and alternatives were explained to
the patient. Questions regarding the procedure were encouraged and
answered. The patient understands and consents to the procedure.
Patency of the right IJ vein was confirmed with ultrasound with
image documentation. An appropriate skin site was determined. Region
was prepped using maximum barrier technique including cap and mask,
sterile gown, sterile gloves, large sterile sheet, and Chlorhexidine
as cutaneous antisepsis. The region was infiltrated locally with 1%
lidocaine.
Under real-time ultrasound guidance, the right IJ vein was accessed
with a 21 gauge micropuncture needle; the needle tip within the vein
was confirmed with ultrasound image documentation.

[Series 1: ir us guide vasc access right · 1 of 1 slices shown]
[im 1/1]
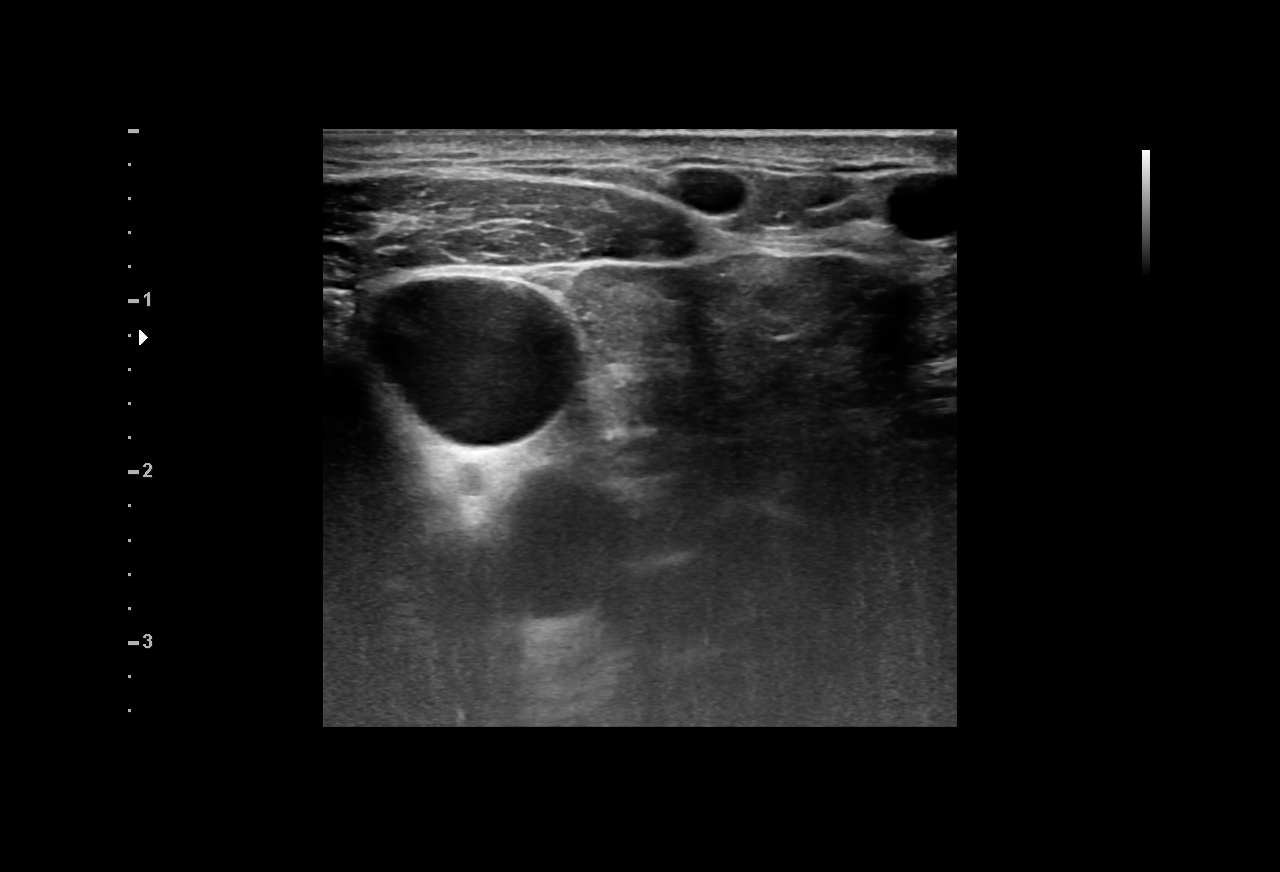

[1 of 1 positions shown; findings below may reference images not displayed]

5F single lumen cuffed powerPICC tunneled from a right anterior
chest wall approach to the dermatotomy site. Needle exchanged over
the 018 guidewire for transitional dilator, through which the
catheter which had been cut to 23 cm was advanced under intermittent
fluoroscopy, positioned with its tip at the cavoatrial junction.
Spot chest radiograph confirms good catheter position. No
pneumothorax. Catheter was flushed per protocol. Catheter secured
externally with O Prolene suture. The right IJ dermatotomy site was
closed with Dermabond.

COMPLICATIONS:
COMPLICATIONS
None immediate

FLUOROSCOPY TIME:  12 seconds, 2 mGy
IMPRESSION: 1. Technically successful placement of tunneled right IJ tunneled
single lumen power injectable catheter with ultrasound and
fluoroscopic guidance. Ready for routine use.

## 2019-07-14 IMAGING — US IR PARACENTESIS
1 series · 3 of 3 positions shown · non-contrast
Comparison: none

INDICATION: Congestive heart failure. LVAD. Recurrent ascites. Request for
therapeutic paracentesis.

[Series 1: ir rad eval and mgt. · 3 of 3 slices shown]
[im 1/3]
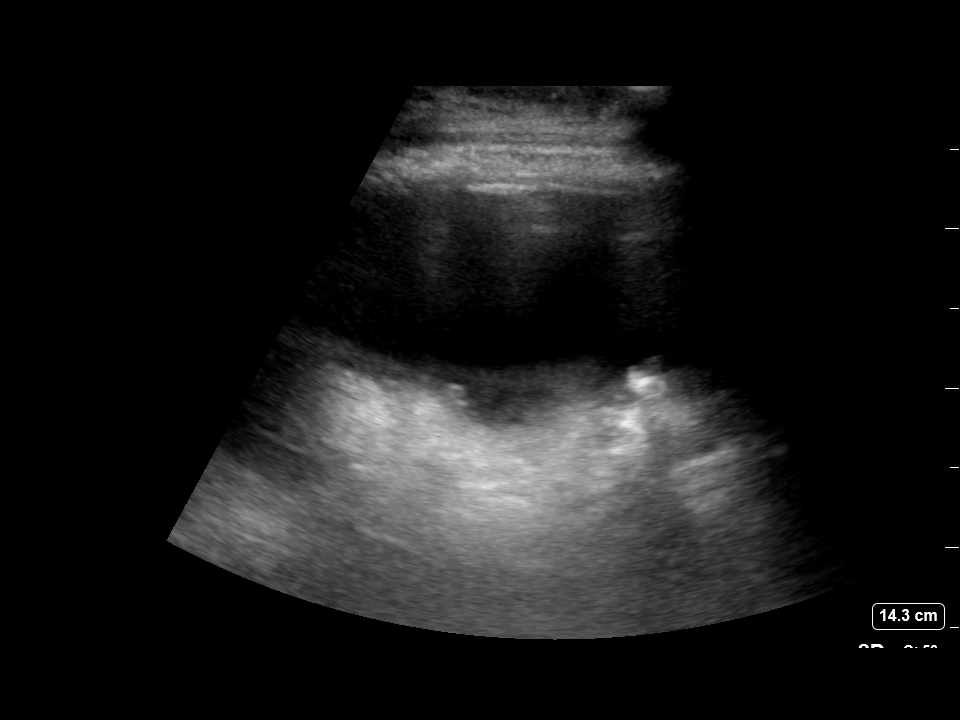
[im 2/3]
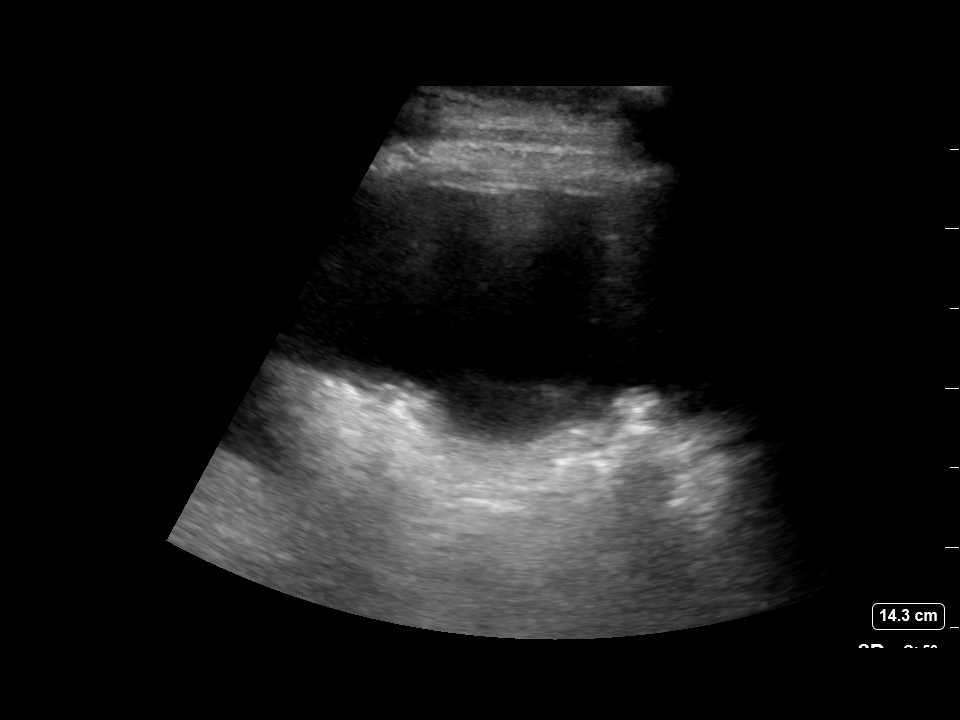
[im 3/3]
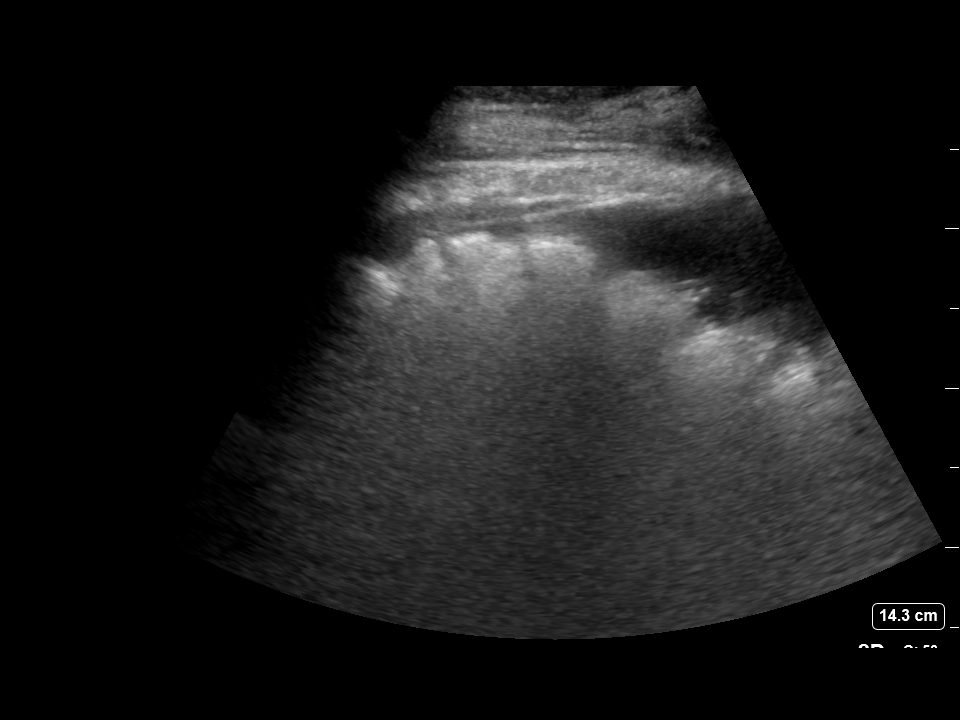

[3 of 3 positions shown; findings below may reference images not displayed]

EXAM:
ULTRASOUND GUIDED PARACENTESIS

MEDICATIONS:
1% Lidocaine = 10 mL

COMPLICATIONS:
None immediate.

PROCEDURE:
Informed written consent was obtained from the patient after a
discussion of the risks, benefits and alternatives to treatment. A
timeout was performed prior to the initiation of the procedure.

Initial ultrasound scanning demonstrates a large amount of ascites
within the right lower abdominal quadrant. The right lower abdomen
was prepped and draped in the usual sterile fashion. 1% lidocaine
with epinephrine was used for local anesthesia.

Following this, a 19 gauge, 7-cm, Yueh catheter was introduced. An
ultrasound image was saved for documentation purposes. The
paracentesis was performed. The catheter was removed and a dressing
was applied. The patient tolerated the procedure well without
immediate post procedural complication.
FINDINGS: A total of approximately 5.6 liters of clear yellow fluid was
removed.
IMPRESSION: Successful ultrasound-guided paracentesis yielding 5.6 liters of
peritoneal fluid.

## 2019-08-01 IMAGING — US IR ABDOMEN US LIMITED
1 series · 4 of 4 positions shown · non-contrast
Comparison: 02/23/2018

CLINICAL DATA: Heart failure and history of recurrent ascites with
most recent paracentesis procedure on 02/23/2018.

EXAM:
LIMITED ABDOMEN ULTRASOUND FOR ASCITES
TECHNIQUE: Limited ultrasound survey for ascites was performed in all four
abdominal quadrants.

[Series 1: ir (id) (id)/(id)/(id) ir · 4 of 4 slices shown]
[im 1/4]
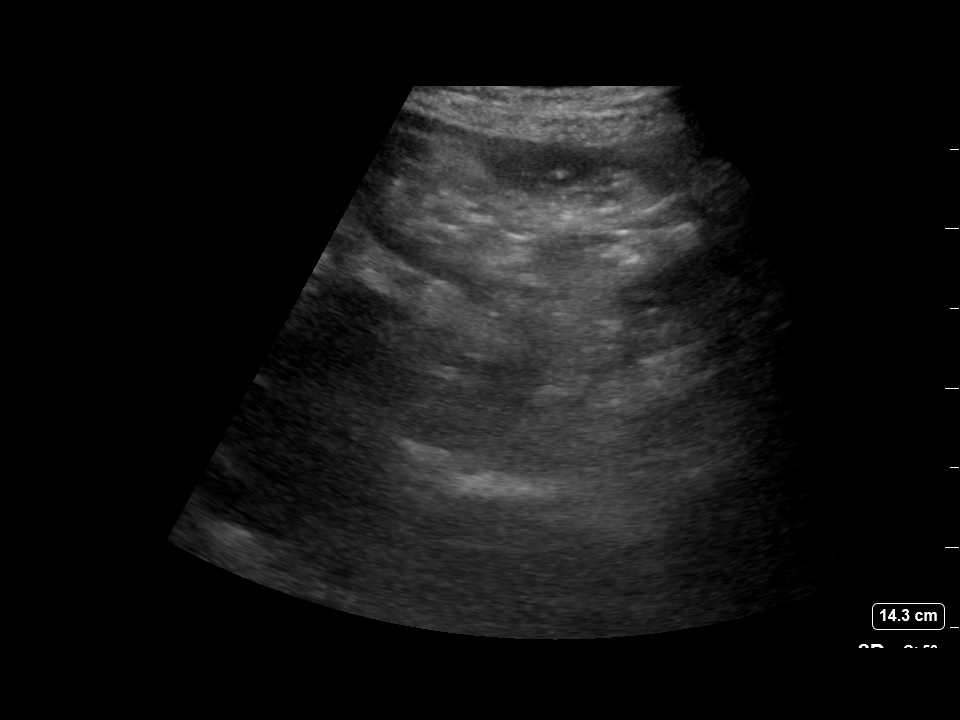
[im 2/4]
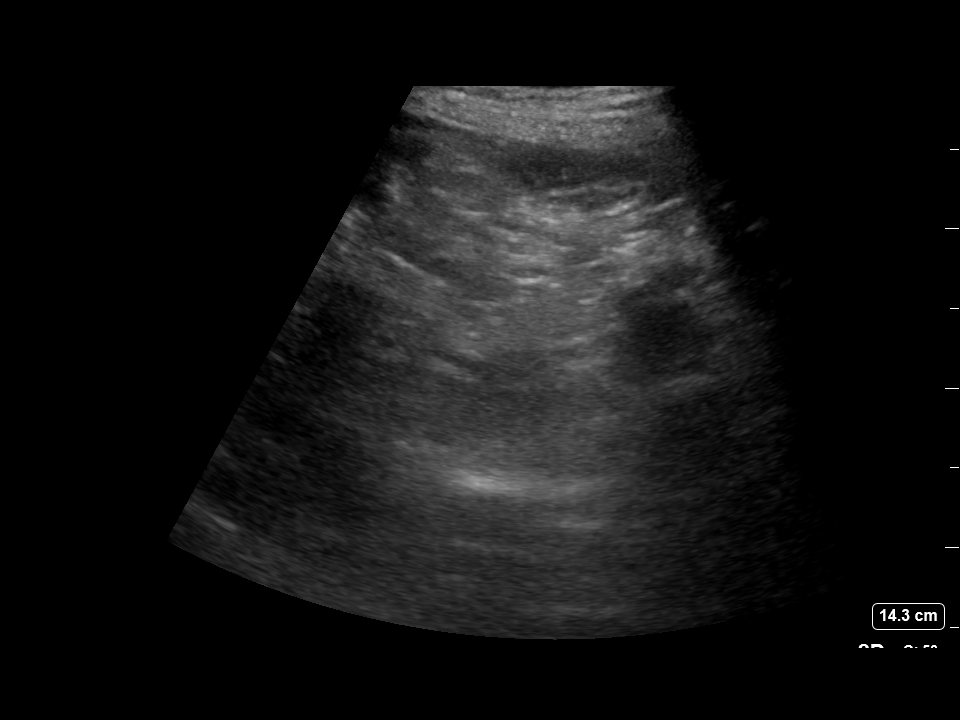
[im 3/4]
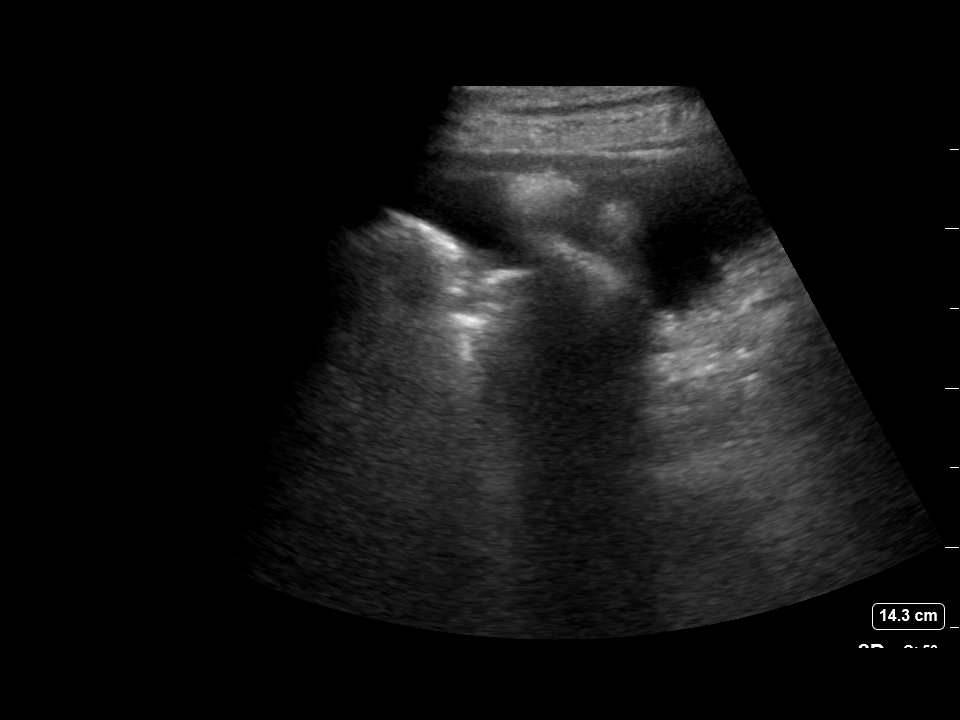
[im 4/4]
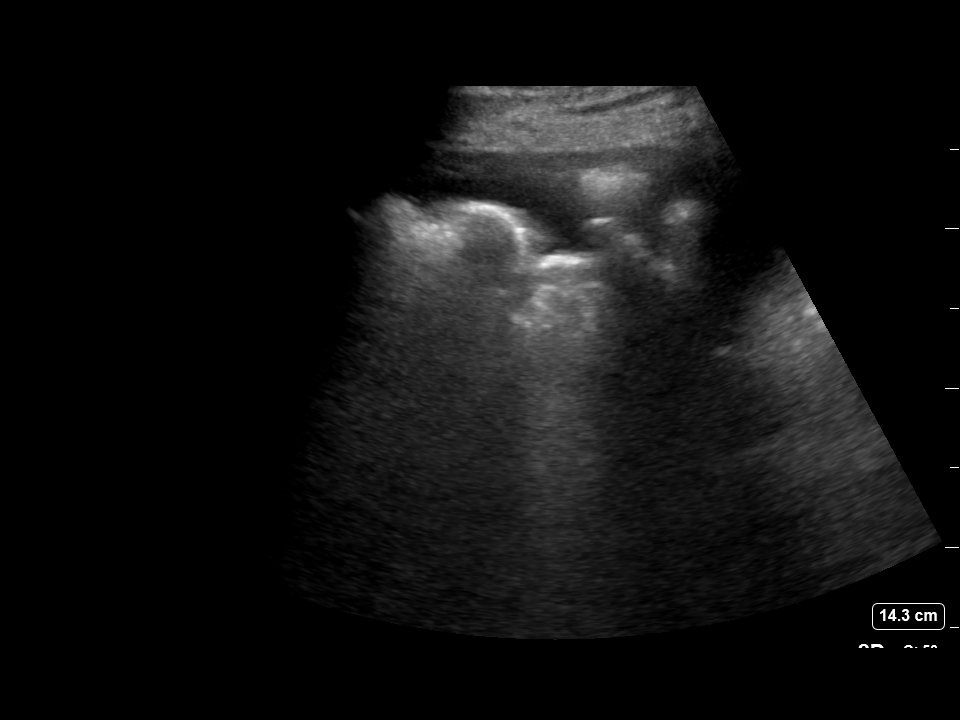

[4 of 4 positions shown; findings below may reference images not displayed]

FINDINGS: Imaging of the abdomen with ultrasound shows only a small amount of
ascites scattered throughout the peritoneal cavity. Paracentesis was
not performed today.
IMPRESSION: Small amount of ascites in the peritoneal cavity. Paracentesis was
not performed today.

## 2019-08-19 IMAGING — US US ABDOMEN LIMITED
1 series · 6 of 6 positions shown · non-contrast
Comparison: Plain films 06/13/2017

CLINICAL DATA: Evaluate for ascites.

EXAM:
LIMITED ABDOMEN ULTRASOUND FOR ASCITES
TECHNIQUE: Limited ultrasound survey for ascites was performed in all four
abdominal quadrants.

[Series 1: us abdomen limited · 0.26mm/px · 6 of 6 slices shown]
[im 1/6]
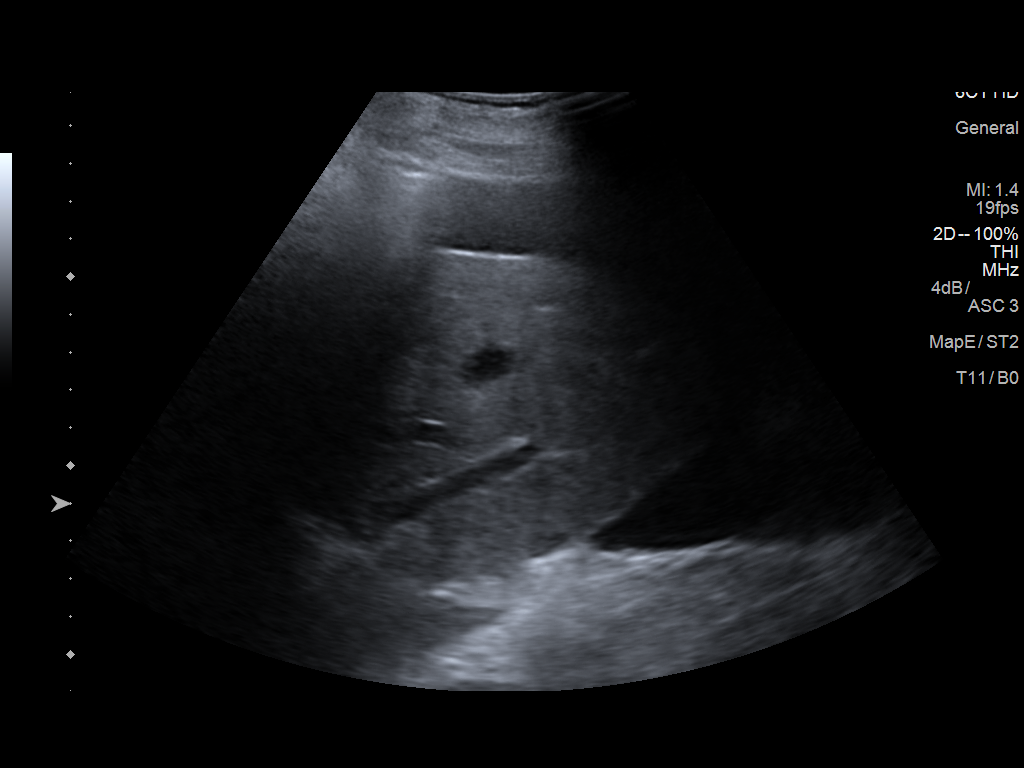
[im 2/6]
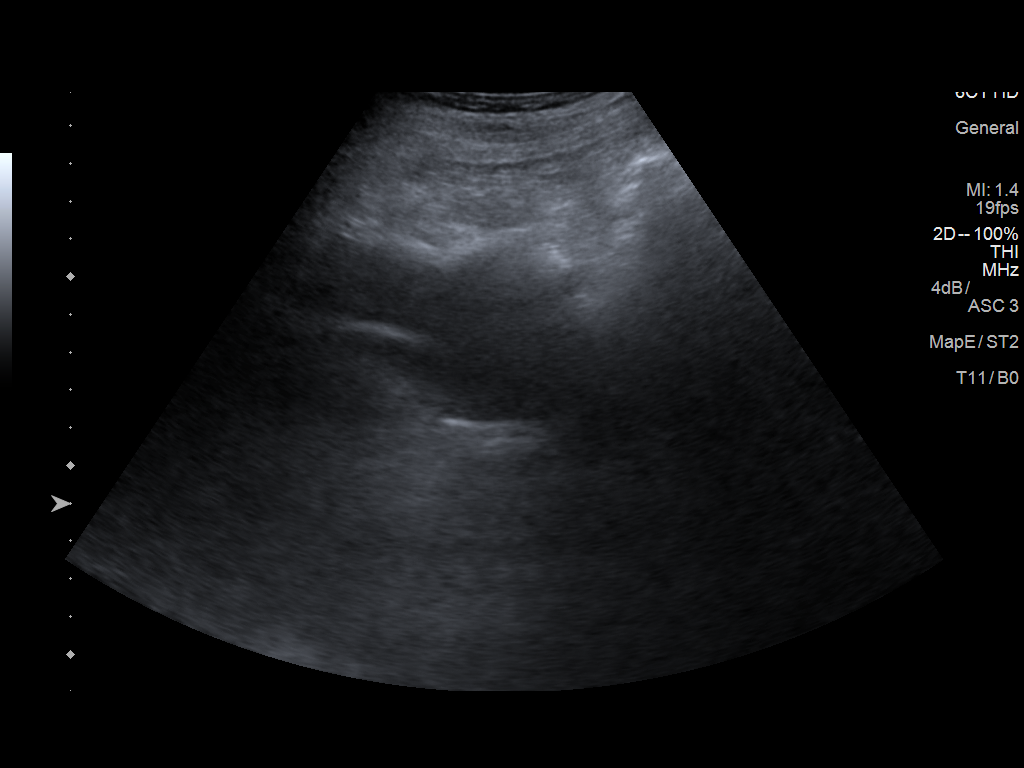
[im 3/6]
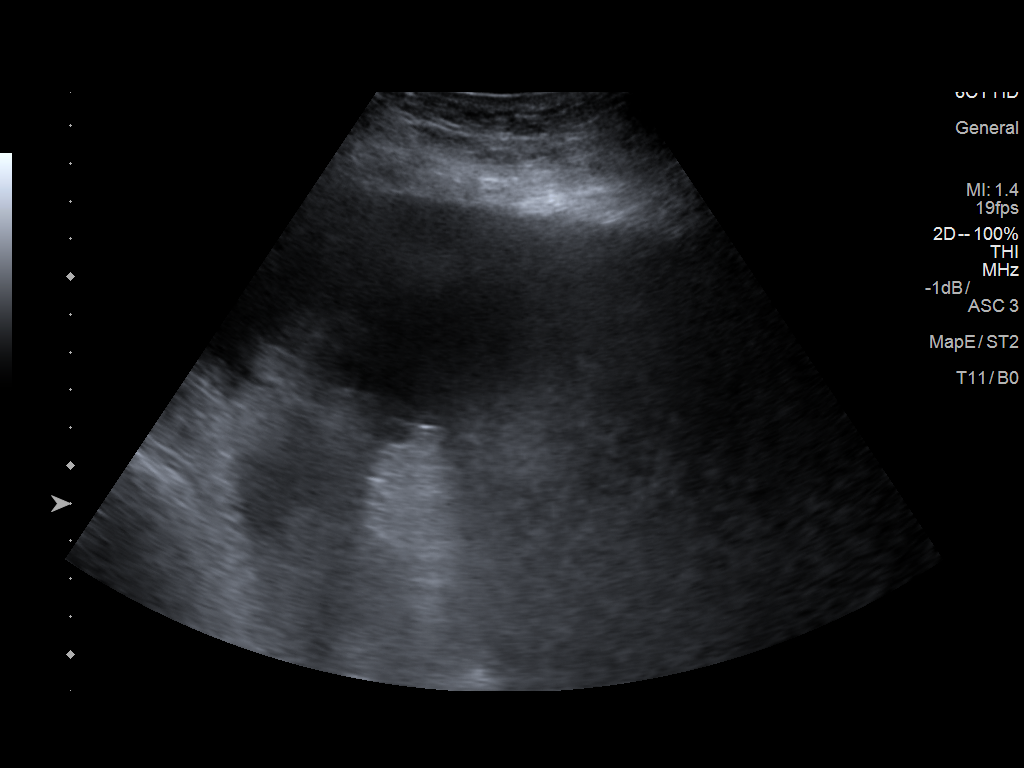
[im 4/6]
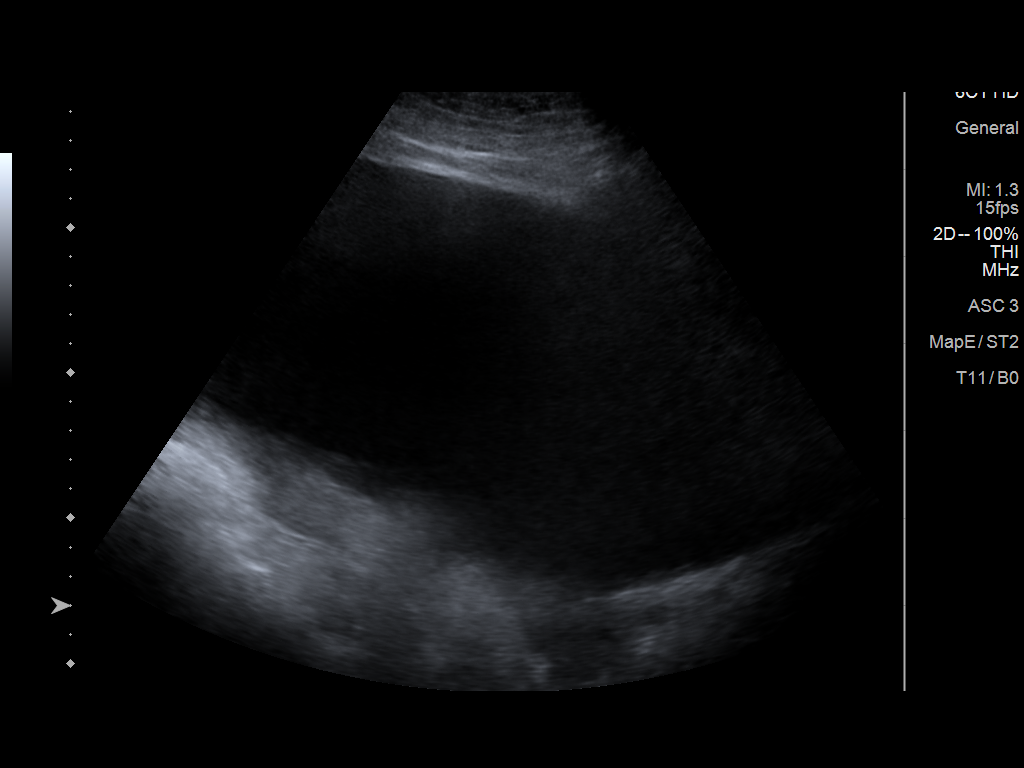
[im 5/6]
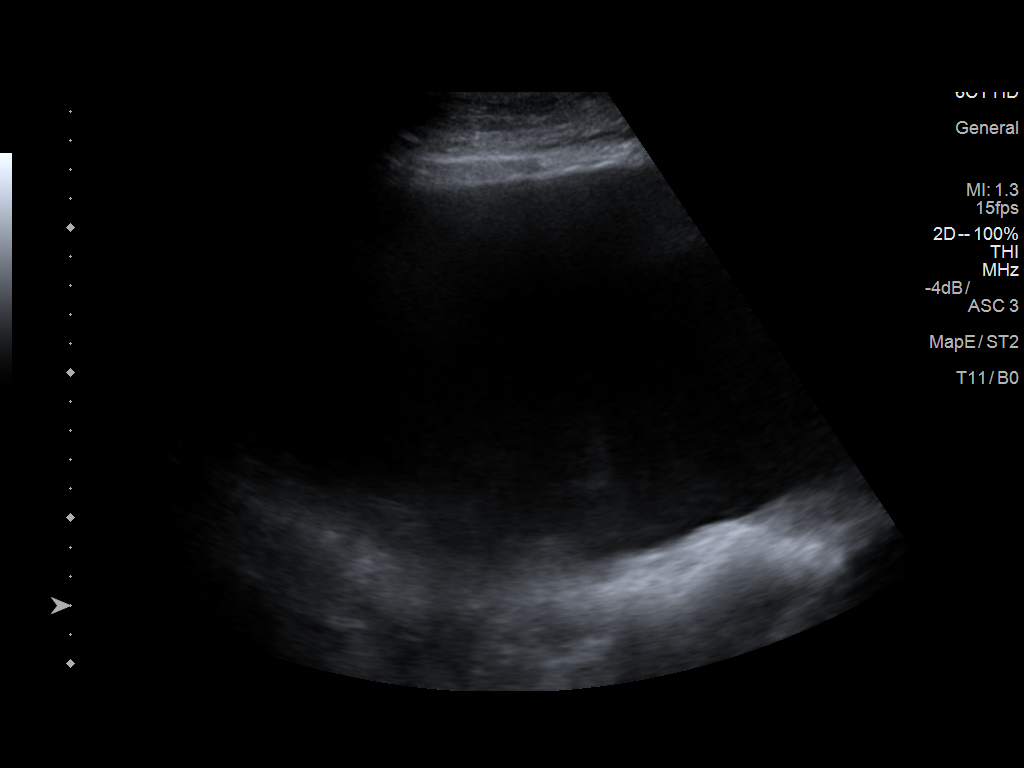
[im 6/6]
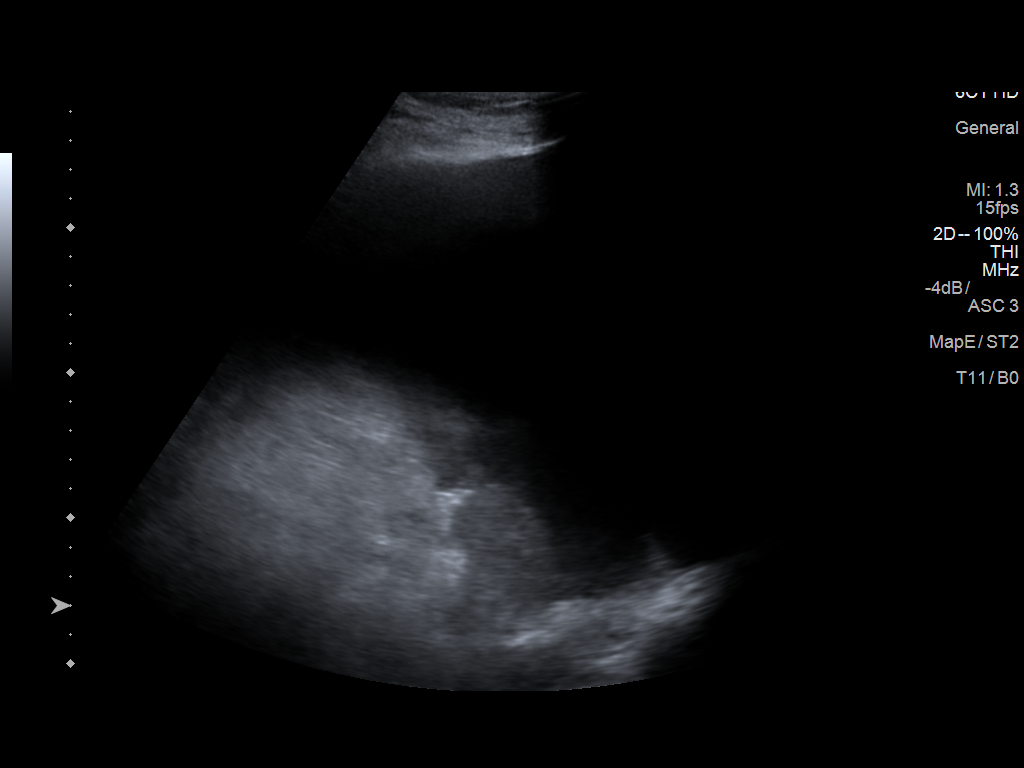

[6 of 6 positions shown; findings below may reference images not displayed]

FINDINGS: Moderate amount of ascites is identified within all 4 quadrants. The
largest pockets are seen in the left lower and left upper quadrants.
IMPRESSION: Moderate amount of ascites.

## 2019-08-21 IMAGING — DX DG CHEST 1V PORT
1 series · 2 of 2 positions shown · non-contrast
Comparison: Chest x-ray dated February 23, 2018.

CLINICAL DATA: Shortness of breath.

EXAM:
PORTABLE CHEST 1 VIEW

[Series 1: chest · 0.14mm/px · 2 of 2 slices shown]
[im 1/2]
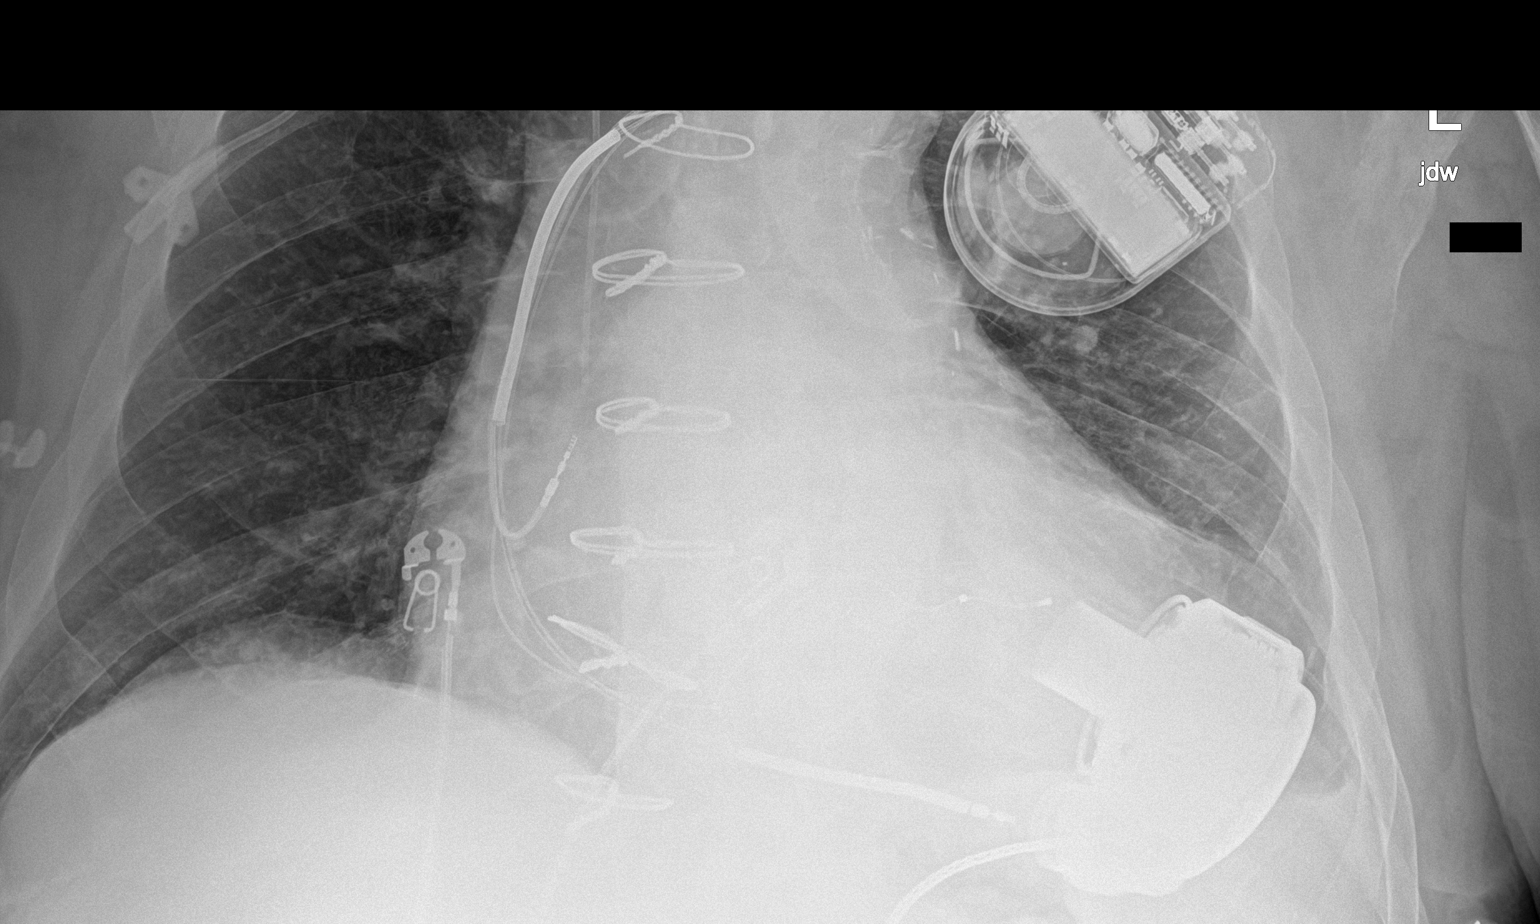
[im 2/2]
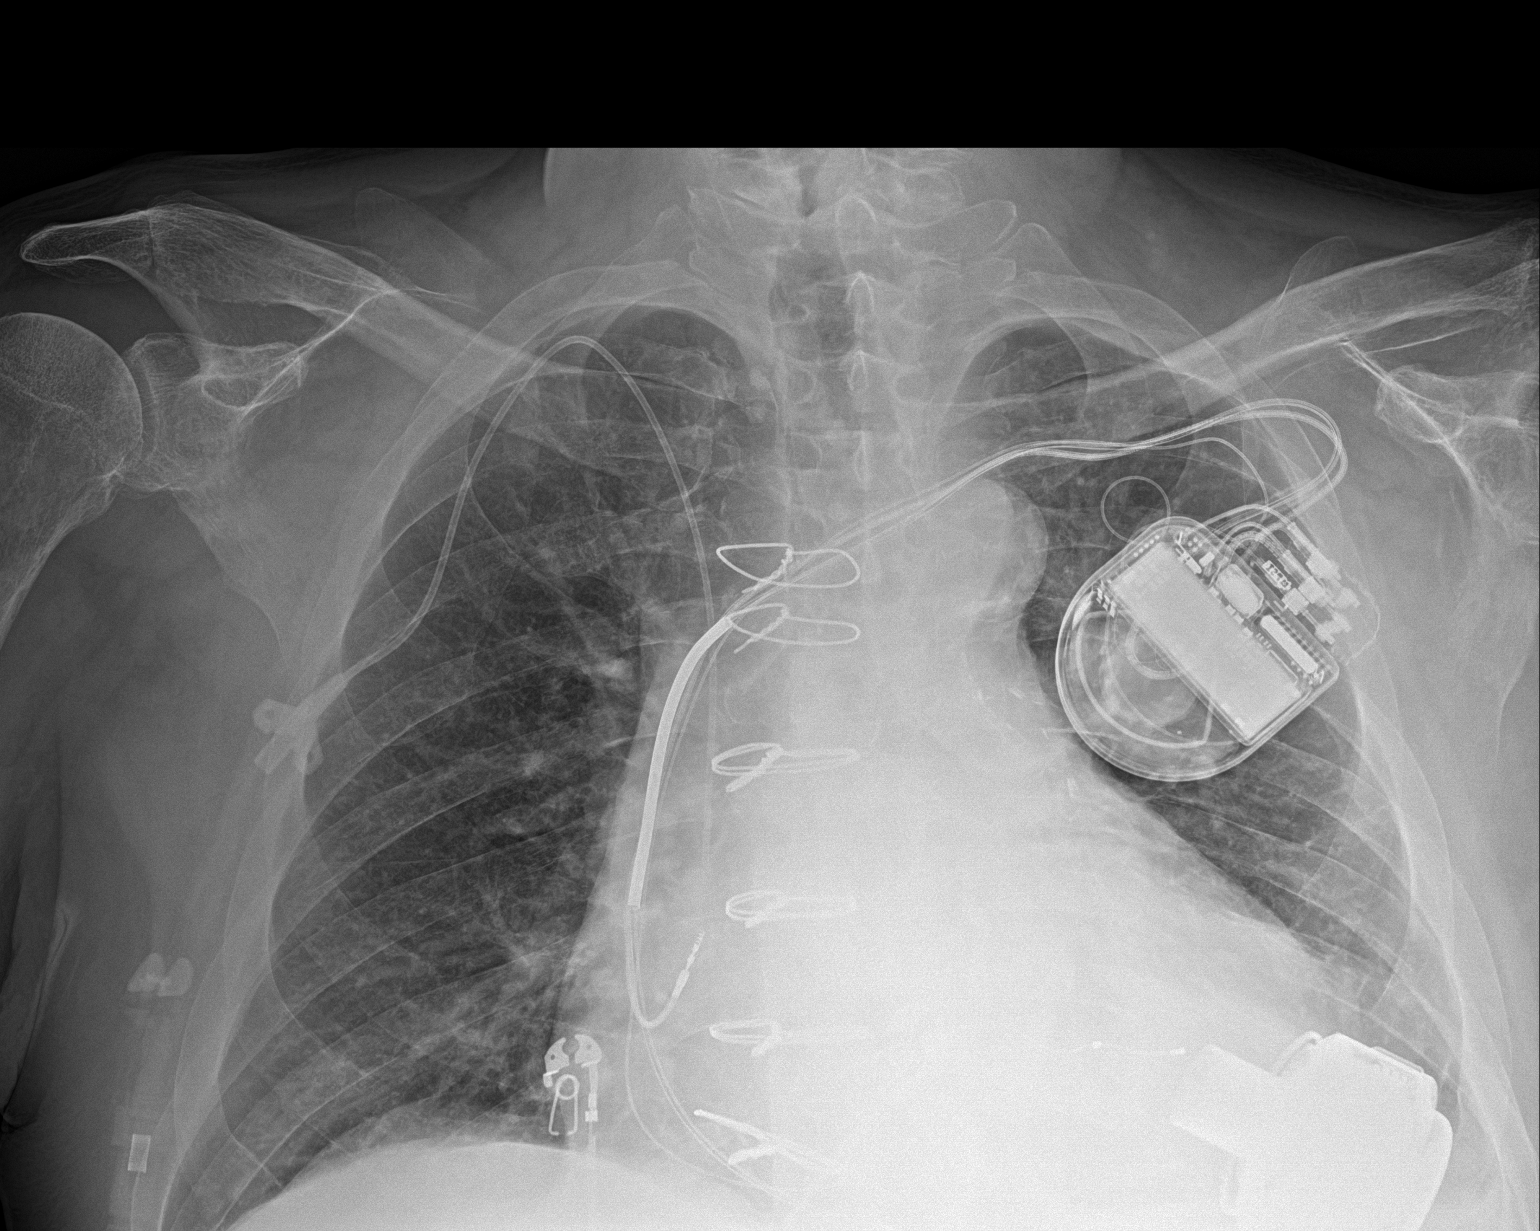

[2 of 2 positions shown; findings below may reference images not displayed]

FINDINGS: Unchanged left chest wall AICD. Unchanged tunneled right internal
jugular central venous catheter. Unchanged LVAD device. Stable
cardiomegaly status post CABG. Normal pulmonary vascularity.
Unchanged chronic blunting of the left costophrenic angle. No
consolidation, large pleural effusion, or pneumothorax. No acute
osseous abnormality.
IMPRESSION: No active disease.

## 2019-08-22 IMAGING — US IR PARACENTESIS
2 series · 4 of 4 positions shown · non-contrast
Comparison: none

INDICATION: Patient with history HF, LVAD, and recurrent ascites. Request is
made for therapeutic paracentesis.

[Series 1: ir (id) (id)/(id)/(id) ir · 3 of 3 slices shown (1 of 2)]
[im 1/3]
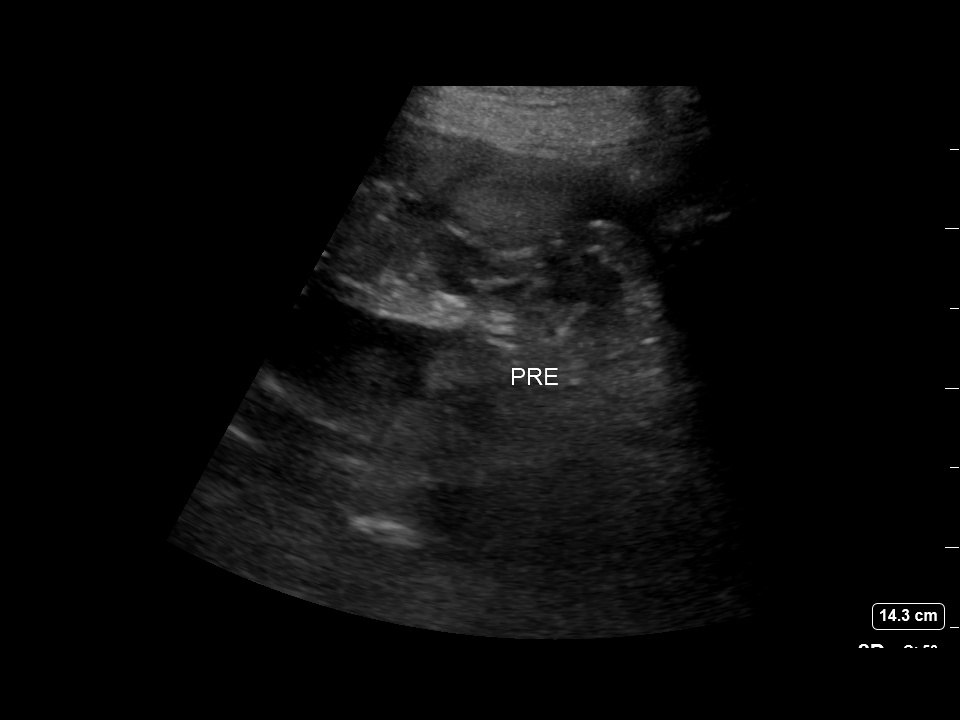
[im 2/3]
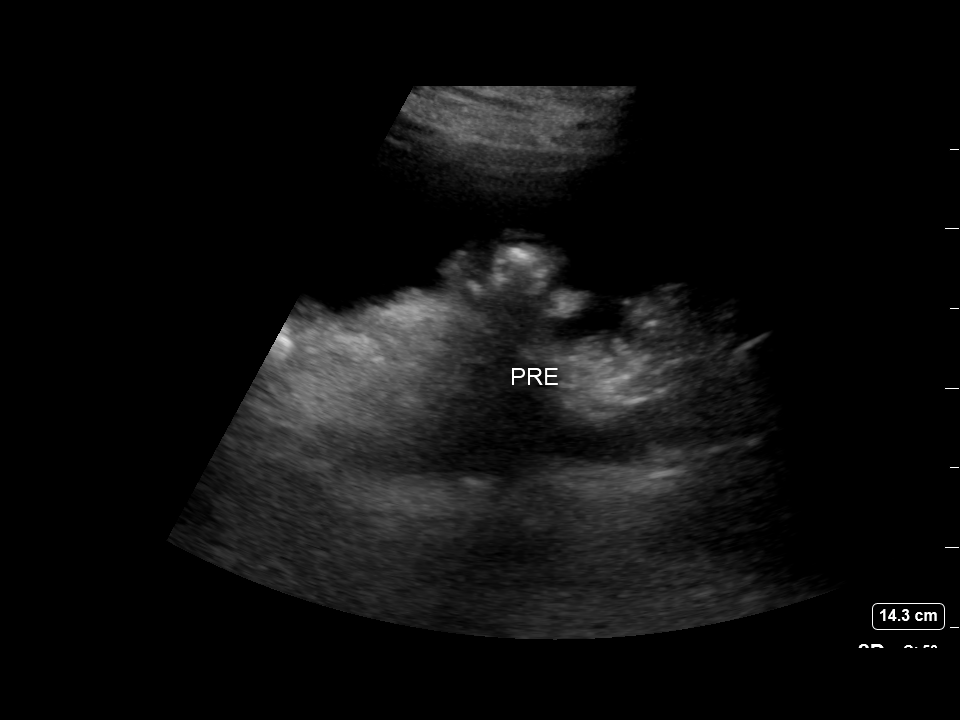
[im 3/3]
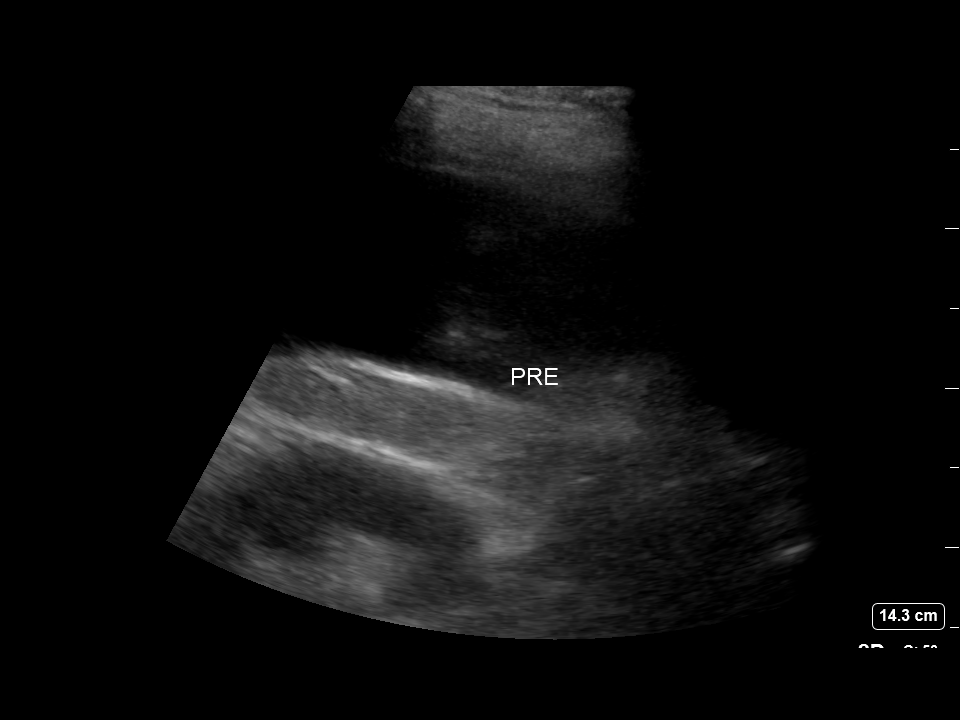

[Series 1: ir (id) (id)/(id)/(id) ir · 1 of 1 slices shown (2 of 2)]
[im 1/1]
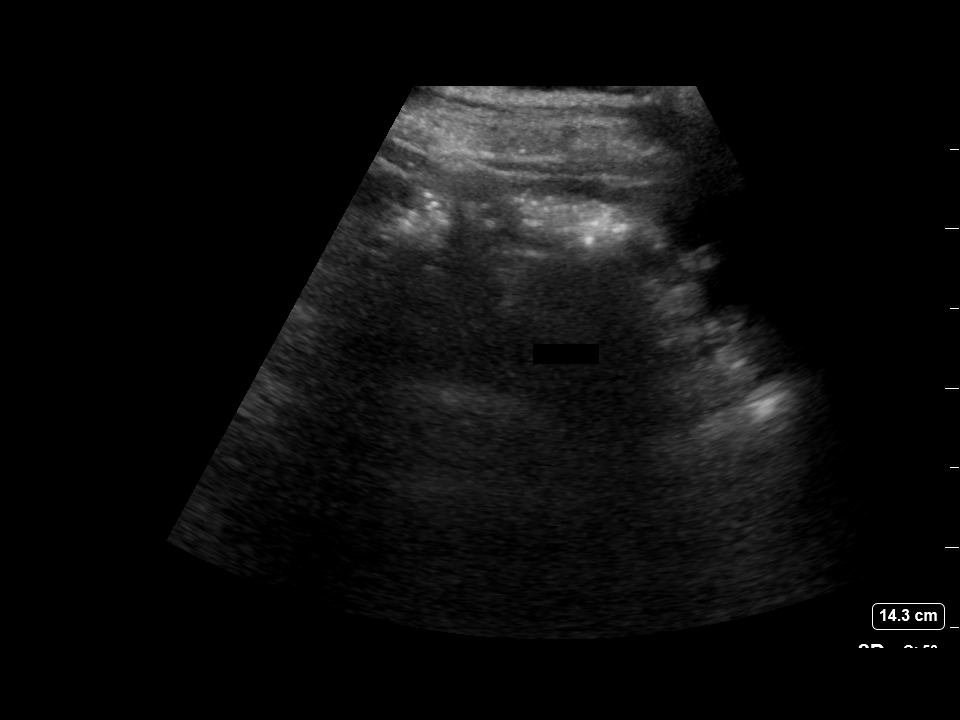

[4 of 4 positions shown; findings below may reference images not displayed]

EXAM:
ULTRASOUND GUIDED THERAPEUTIC PARACENTESIS

MEDICATIONS:
10 mL of 2% lidocaine

COMPLICATIONS:
None immediate.

PROCEDURE:
Informed written consent was obtained from the patient after a
discussion of the risks, benefits and alternatives to treatment. A
timeout was performed prior to the initiation of the procedure.

Initial ultrasound scanning demonstrates a moderate amount of
ascites within the right lower abdominal quadrant. The right lower
abdomen was prepped and draped in the usual sterile fashion. 1%
lidocaine was used for local anesthesia.

Following this, a 19 gauge, 7-cm, Yueh catheter was introduced. An
ultrasound image was saved for documentation purposes. The
paracentesis was performed. The catheter was removed and a dressing
was applied. The patient tolerated the procedure well without
immediate post procedural complication.
FINDINGS: A total of approximately 3 L of clear gold fluid was removed.
IMPRESSION: Successful ultrasound-guided paracentesis yielding 3 L of peritoneal
fluid.

## 2019-08-26 IMAGING — US IR PARACENTESIS
1 series · 3 of 3 positions shown · non-contrast
Comparison: none

INDICATION: Patient with congestive heart failure, recurrent ascites. Request is
made for therapeutic paracentesis.

[Series 1: ir (id) (id)/(id)/(id) ir · 3 of 3 slices shown]
[im 1/3]
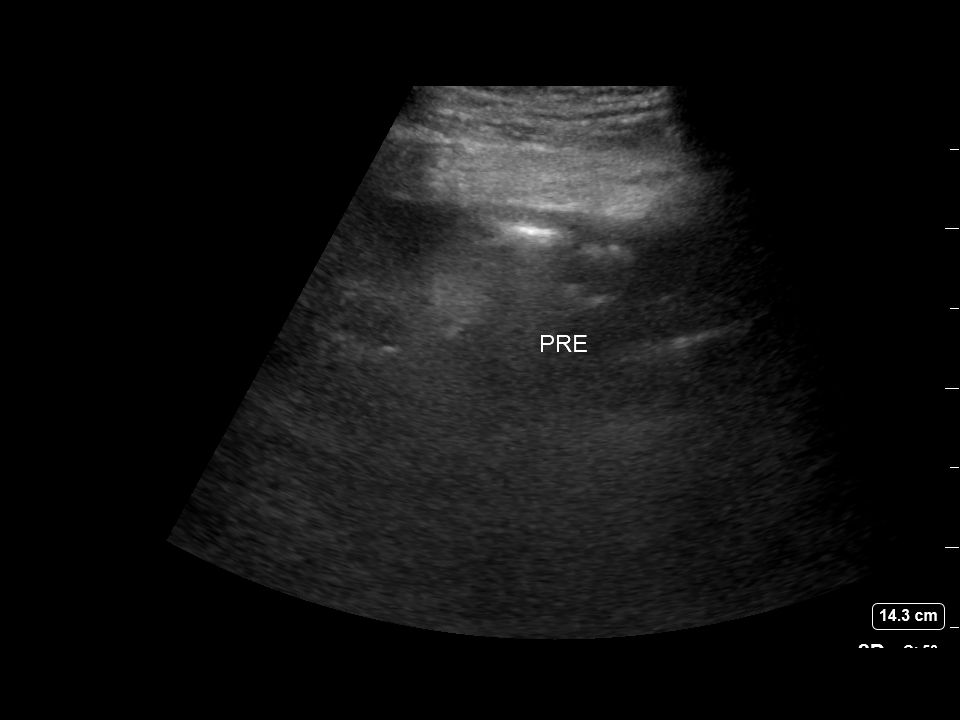
[im 2/3]
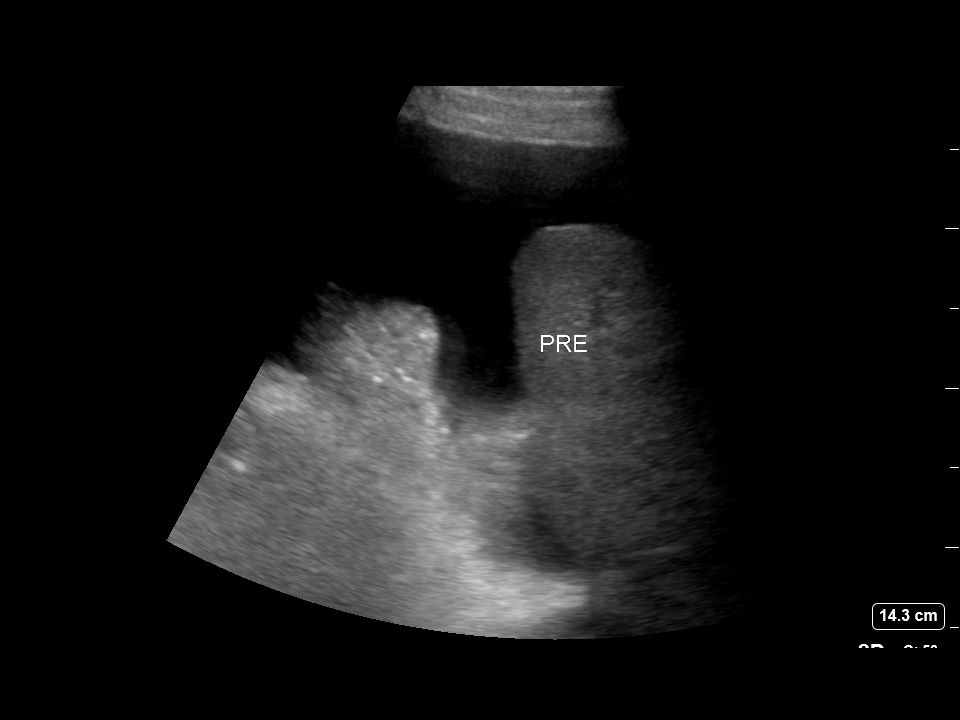
[im 3/3]
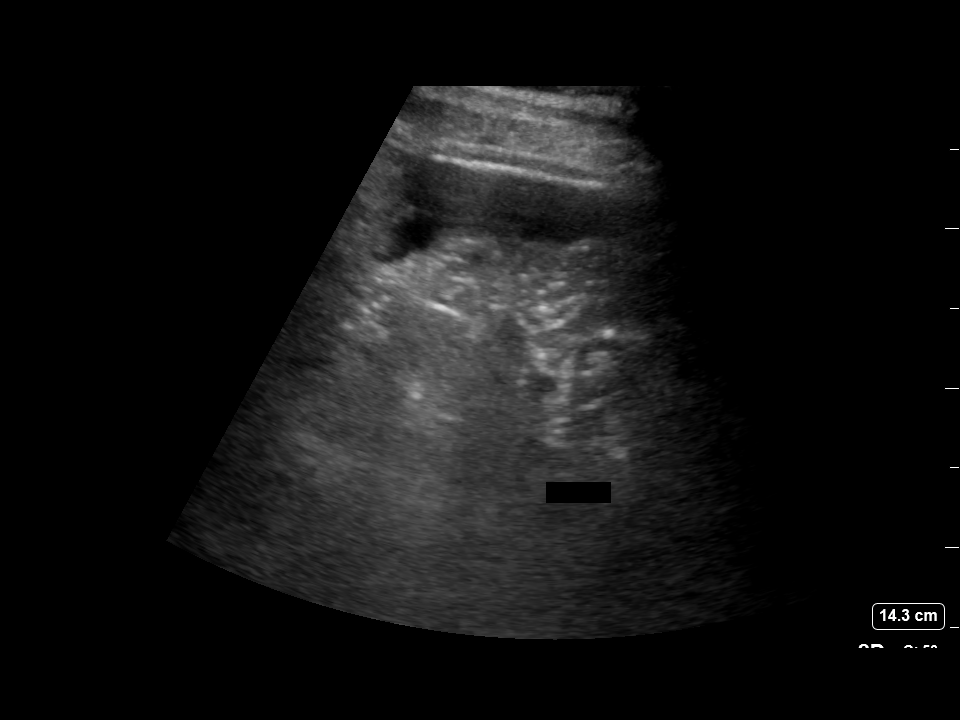

[3 of 3 positions shown; findings below may reference images not displayed]

EXAM:
ULTRASOUND GUIDED THERAPEUTIC PARACENTESIS

MEDICATIONS:
10 mL 2% lidocaine

COMPLICATIONS:
None immediate.

PROCEDURE:
Informed written consent was obtained from the patient after a
discussion of the risks, benefits and alternatives to treatment. A
timeout was performed prior to the initiation of the procedure.

Initial ultrasound scanning demonstrates a small amount of ascites
within the right lower abdominal quadrant. The right lower abdomen
was prepped and draped in the usual sterile fashion. 2% lidocaine
was used for local anesthesia.

Following this, a 19 gauge, 7-cm, Yueh catheter was introduced. An
ultrasound image was saved for documentation purposes. The
paracentesis was performed. The catheter was removed and a dressing
was applied. The patient tolerated the procedure well without
immediate post procedural complication.
FINDINGS: A total of approximately 0.5 liters of yellow fluid was removed.
IMPRESSION: Successful ultrasound-guided therapeutic paracentesis yielding
liters of peritoneal fluid.

## 2019-09-16 IMAGING — US IR PARACENTESIS
1 series · 4 of 4 positions shown · non-contrast
Comparison: none

INDICATION: Recurrent ascites due to decompensated CHF. Request for therapeutic
paracentesis.

[Series 1: ir (id) (id)/(id)/(id) ir · 4 of 4 slices shown]
[im 1/4]
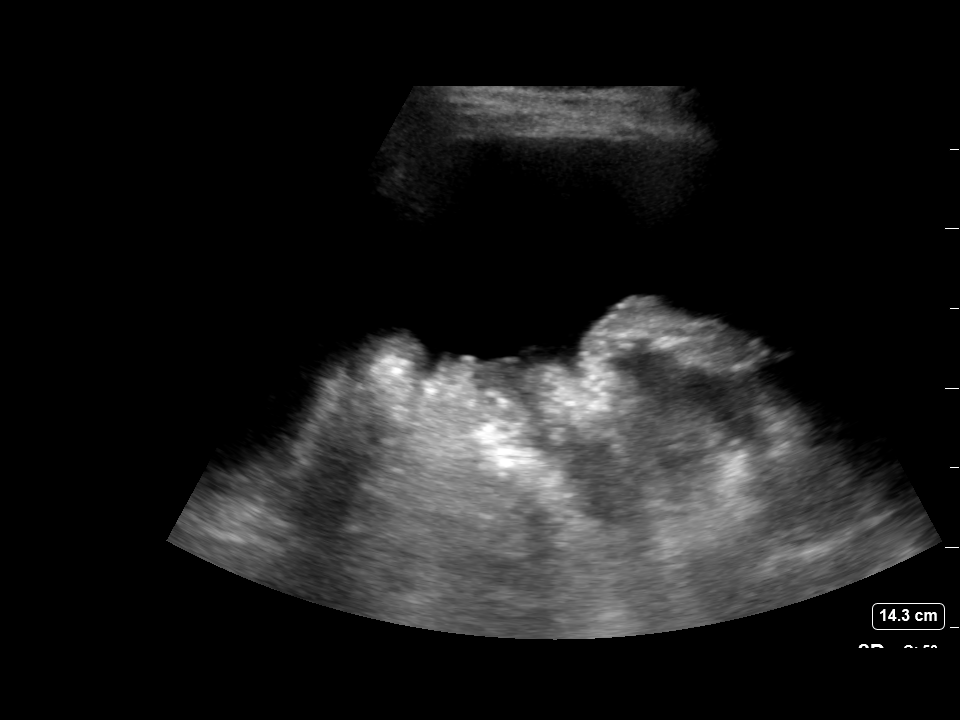
[im 2/4]
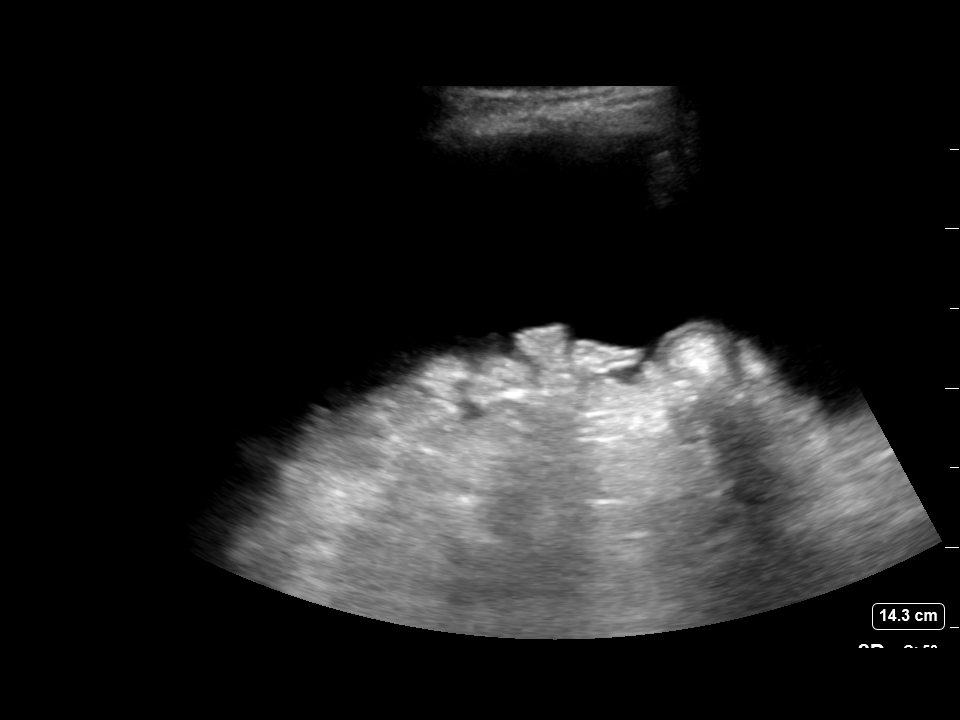
[im 3/4]
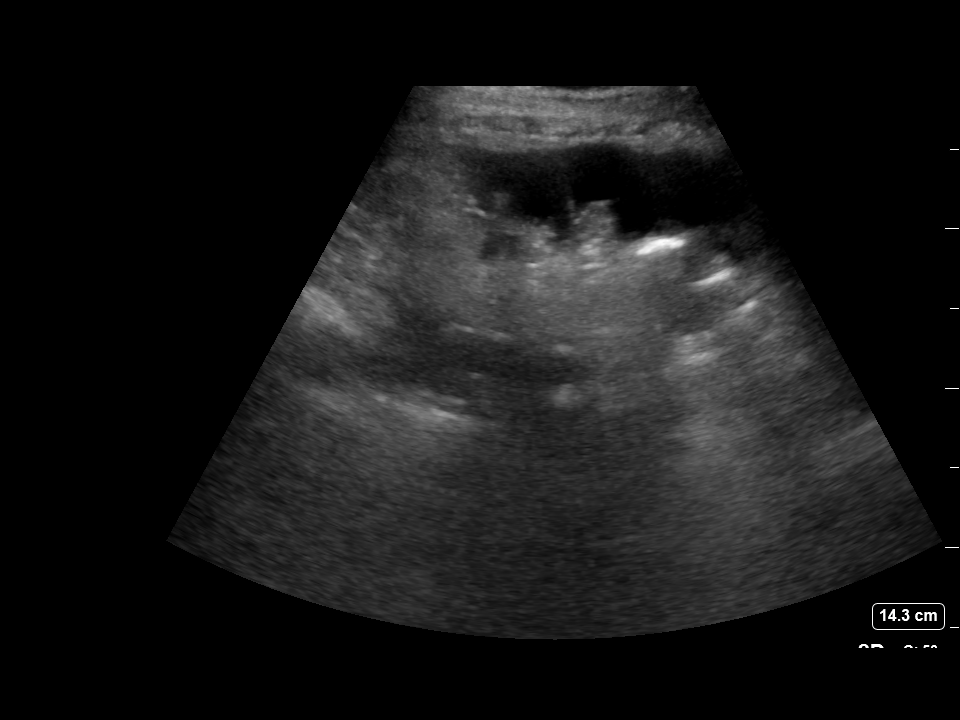
[im 4/4]
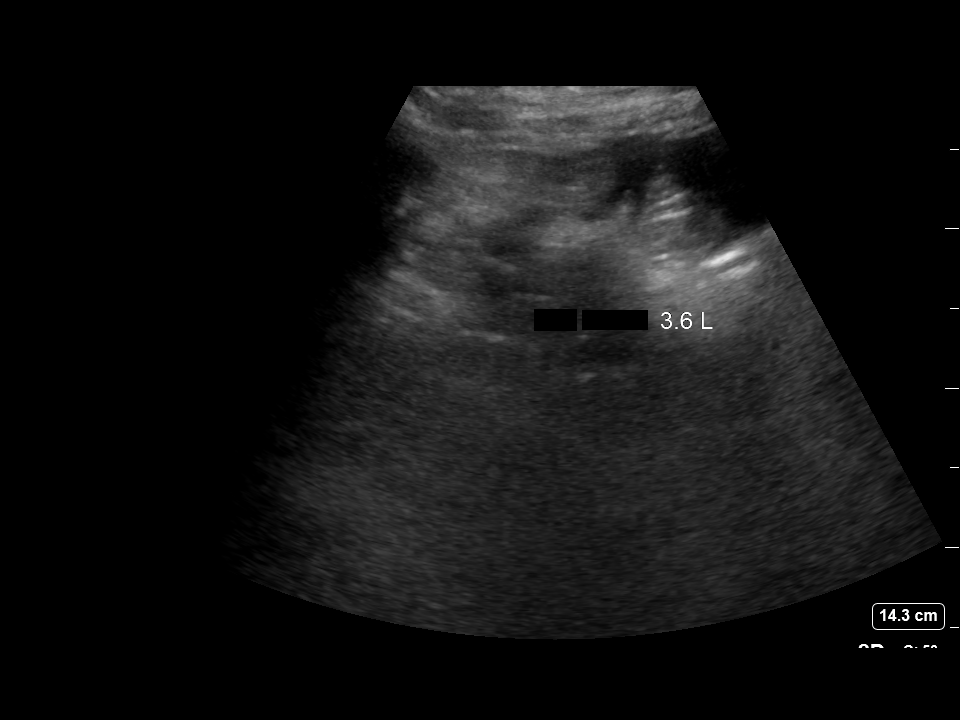

[4 of 4 positions shown; findings below may reference images not displayed]

EXAM:
ULTRASOUND GUIDED THERAPEUTIC PARACENTESIS

MEDICATIONS:
10 mL 2% lidocaine.

COMPLICATIONS:
None immediate.

PROCEDURE:
Informed written consent was obtained from the patient after a
discussion of the risks, benefits and alternatives to treatment. A
timeout was performed prior to the initiation of the procedure.

Initial ultrasound scanning demonstrates a large amount of ascites
within the left lower abdominal quadrant. The left lower abdomen was
prepped and draped in the usual sterile fashion. 2% lidocaine was
used for local anesthesia.

Following this, a 19 gauge, 7-cm, Yueh catheter was introduced. An
ultrasound image was saved for documentation purposes. The
paracentesis was performed. The catheter was removed and a dressing
was applied. The patient tolerated the procedure well without
immediate post procedural complication.
FINDINGS: A total of approximately 3.6L of yellow fluid was removed. Samples
were not sent to the laboratory as requested by the clinical team.
IMPRESSION: Successful ultrasound-guided paracentesis yielding 3.6 liters of
peritoneal fluid.

Read by Etchu, Melong

## 2019-09-26 IMAGING — US IR PARACENTESIS
1 series · 2 of 2 positions shown · non-contrast
Comparison: none

INDICATION: Recurrent ascites. History of CHF with LVAD, CAD s/p CABG, HTN.
Request for therapeutic paracentesis.

[Series 1: ir (id) (id)/(id)/(id) ir · 2 of 2 slices shown]
[im 1/2]
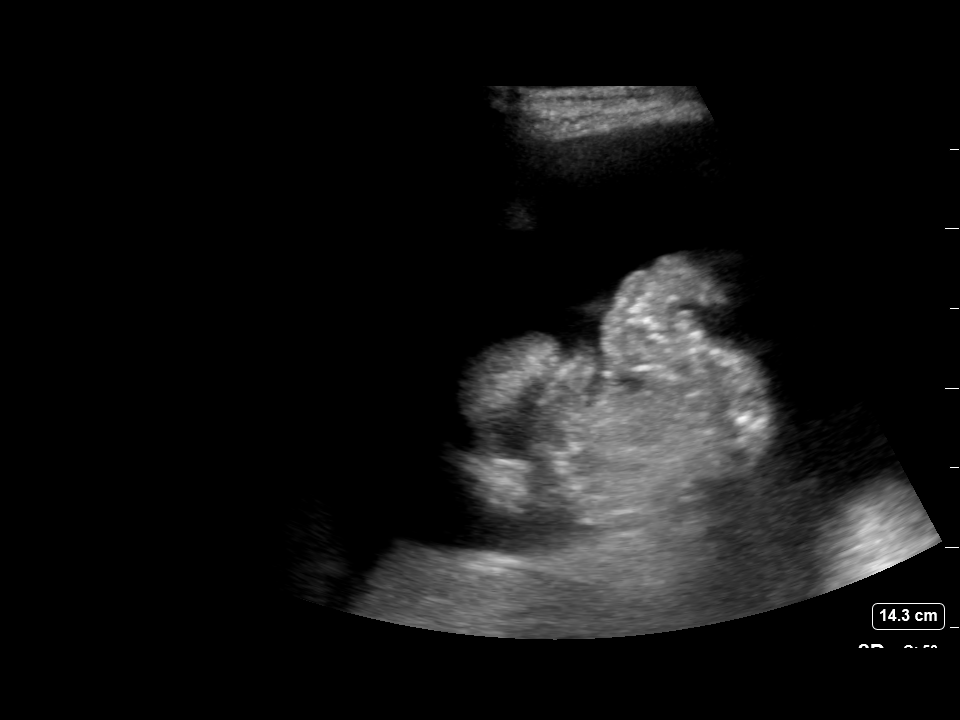
[im 2/2]
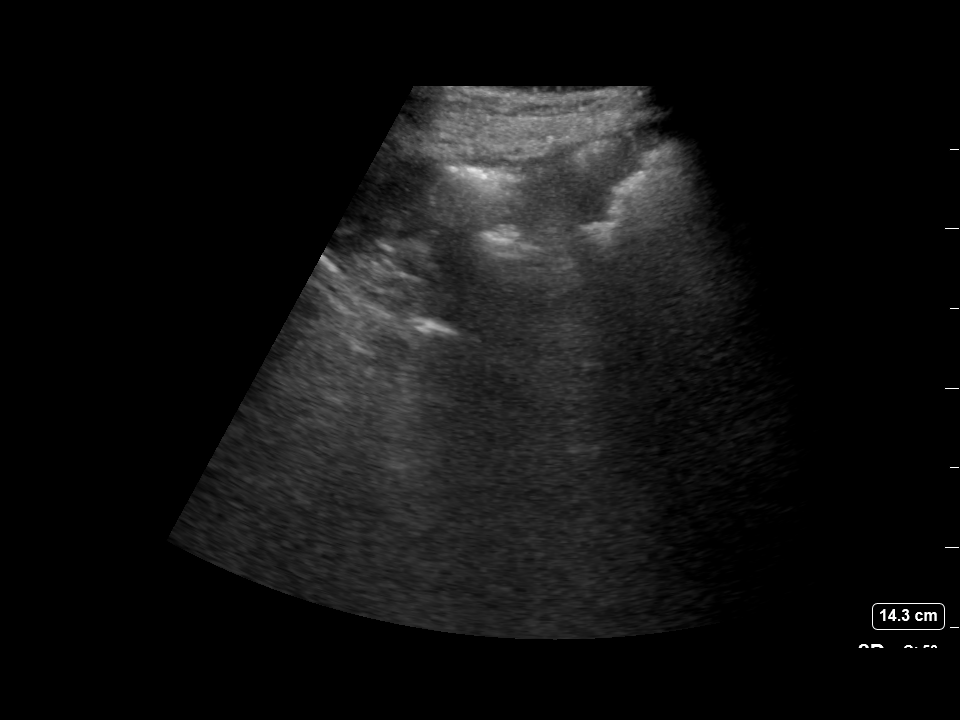

[2 of 2 positions shown; findings below may reference images not displayed]

EXAM:
ULTRASOUND GUIDED THERAPEUTIC PARACENTESIS

MEDICATIONS:
10 mL 2% lidocaine.

COMPLICATIONS:
None immediate.

PROCEDURE:
Informed written consent was obtained from the patient after a
discussion of the risks, benefits and alternatives to treatment. A
timeout was performed prior to the initiation of the procedure.

Initial ultrasound scanning demonstrates a large amount of ascites
within the right lower abdominal quadrant. The right lower abdomen
was prepped and draped in the usual sterile fashion. 2% lidocaine
was used for local anesthesia.

Following this, a 6 Fr Safe-T-Centesis catheter was introduced. An
ultrasound image was saved for documentation purposes. The
paracentesis was performed. The catheter was removed and a dressing
was applied. The patient tolerated the procedure well without
immediate post procedural complication.
FINDINGS: A total of approximately 3.6L of clear yellow fluid was removed.
Samples were not sent to the laboratory.
IMPRESSION: Successful ultrasound-guided paracentesis yielding 3.6 liters of
peritoneal fluid.

Read by Ofeibea, Boatenmaa

## 2019-09-26 IMAGING — DX DG CHEST 1V PORT
1 series · 1 of 1 positions shown · non-contrast
Comparison: Chest radiograph performed 04/02/2018

CLINICAL DATA: Acute onset of worsening shortness of breath and
chest congestion. Productive cough. Abdominal edema.

EXAM:
PORTABLE CHEST 1 VIEW

[chest]
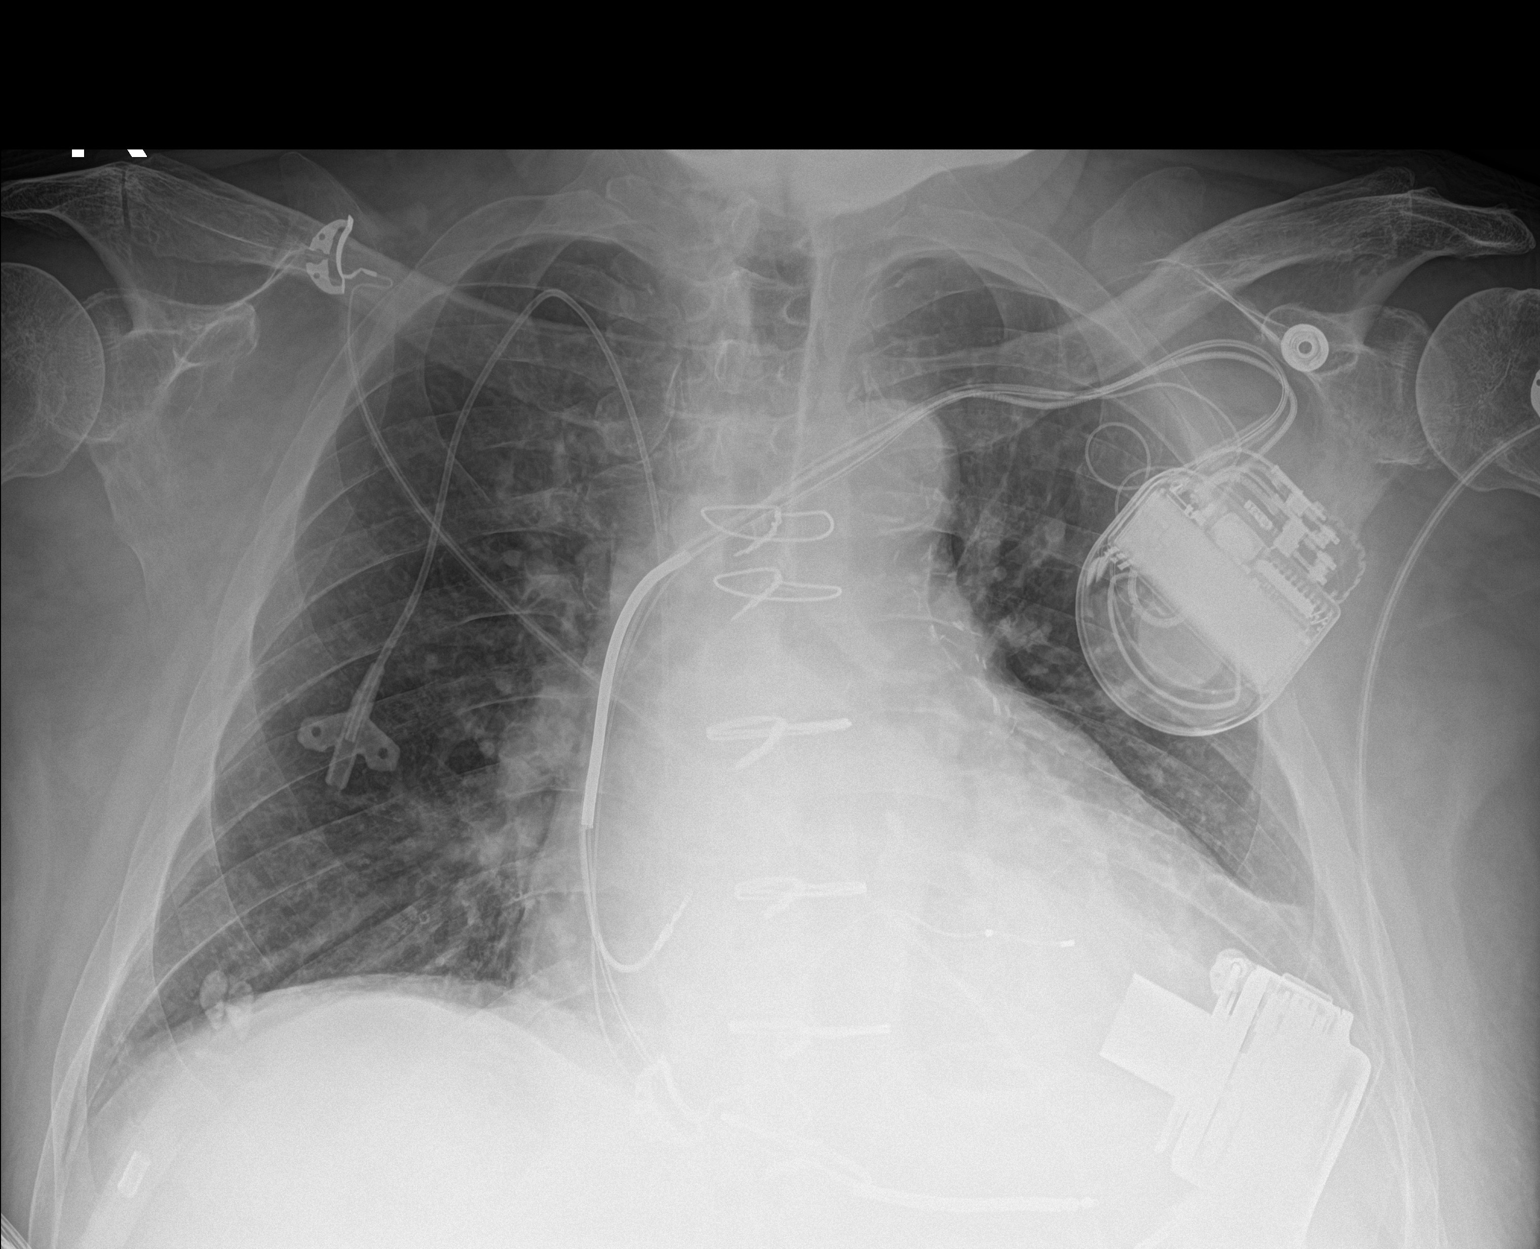

[1 of 1 positions shown; findings below may reference images not displayed]

FINDINGS: The lungs are well-aerated. Vascular congestion is noted. There is
no evidence of focal opacification, pleural effusion or
pneumothorax.

The cardiomediastinal silhouette is mildly enlarged. The patient is
status post median sternotomy. A pacemaker/AICD is noted overlying
the left chest wall, with leads ending overlying the right atrium,
right ventricle and coronary sinus. A left ventricular assist device
is noted. No acute osseous abnormalities are seen.
IMPRESSION: Vascular congestion and mild cardiomegaly. Lungs remain grossly
clear.

## 2019-09-29 IMAGING — US IR PARACENTESIS
1 series · 2 of 2 positions shown · non-contrast
Comparison: none

INDICATION: Recurrent ascites. History of CHF with LVAD. Request for therapeutic
paracentesis.

[Series 1: ir (id) (id)/(id)/(id) ir · 2 of 2 slices shown]
[im 1/2]
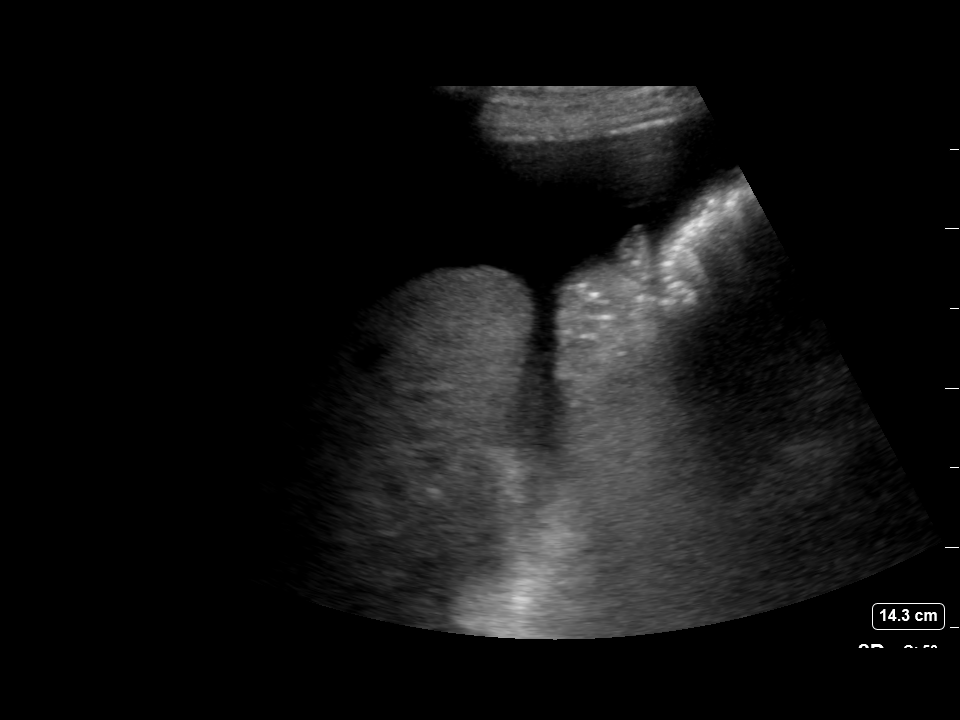
[im 2/2]
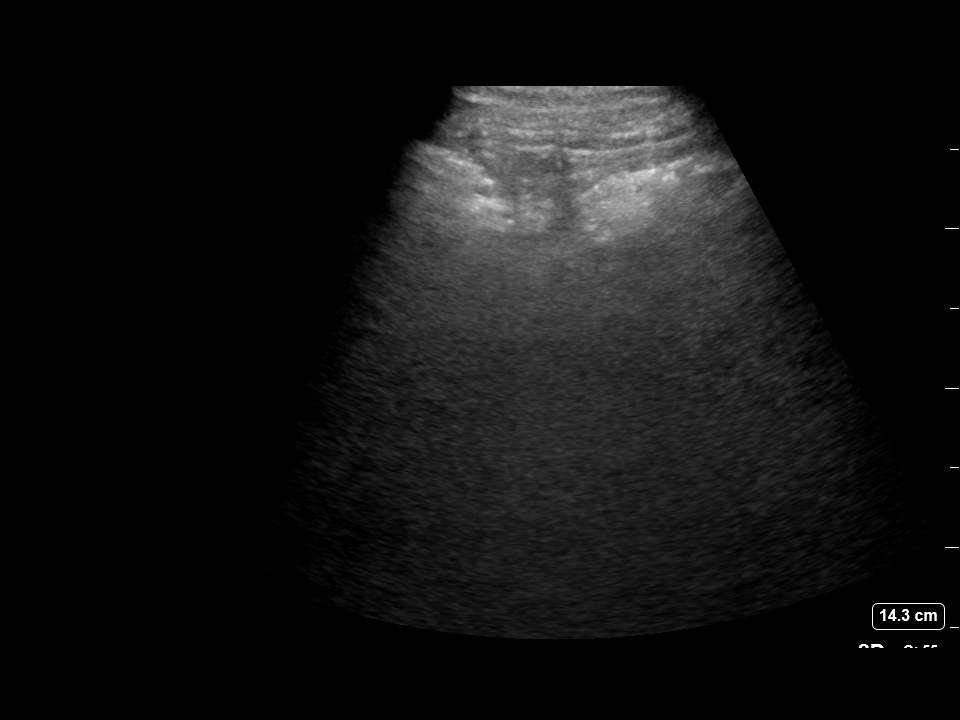

[2 of 2 positions shown; findings below may reference images not displayed]

EXAM:
ULTRASOUND GUIDED PARACENTESIS

MEDICATIONS:
2% lidocaine 10 mL

COMPLICATIONS:
None immediate.

PROCEDURE:
Informed written consent was obtained from the patient after a
discussion of the risks, benefits and alternatives to treatment. A
timeout was performed prior to the initiation of the procedure.

Initial ultrasound scanning demonstrates a moderate amount of
ascites within the right lower abdominal quadrant. The right lower
abdomen was prepped and draped in the usual sterile fashion. 1%
lidocaine with epinephrine was used for local anesthesia.

Following this, a 19 gauge, 7-cm, Yueh catheter was introduced. An
ultrasound image was saved for documentation purposes. The
paracentesis was performed. The catheter was removed and a dressing
was applied. The patient tolerated the procedure well without
immediate post procedural complication.
FINDINGS: A total of approximately 2 L of clear yellow fluid was removed.
IMPRESSION: Successful ultrasound-guided paracentesis yielding 2 liters of
peritoneal fluid.
